# Patient Record
Sex: Male | Born: 1970 | Race: Black or African American | Hispanic: No | Marital: Married | Smoking: Current some day smoker
Health system: Southern US, Community
[De-identification: ages and names within clinical notes are randomized; demographics above are authoritative.]

## PROBLEM LIST (undated history)

## (undated) DIAGNOSIS — I639 Cerebral infarction, unspecified: Secondary | ICD-10-CM

## (undated) DIAGNOSIS — R51 Headache: Secondary | ICD-10-CM

## (undated) DIAGNOSIS — I429 Cardiomyopathy, unspecified: Secondary | ICD-10-CM

## (undated) DIAGNOSIS — Z720 Tobacco use: Secondary | ICD-10-CM

## (undated) DIAGNOSIS — I2510 Atherosclerotic heart disease of native coronary artery without angina pectoris: Secondary | ICD-10-CM

## (undated) DIAGNOSIS — J189 Pneumonia, unspecified organism: Secondary | ICD-10-CM

## (undated) DIAGNOSIS — I63412 Cerebral infarction due to embolism of left middle cerebral artery: Secondary | ICD-10-CM

## (undated) DIAGNOSIS — I38 Endocarditis, valve unspecified: Secondary | ICD-10-CM

## (undated) DIAGNOSIS — Z9114 Patient's other noncompliance with medication regimen: Secondary | ICD-10-CM

## (undated) DIAGNOSIS — I10 Essential (primary) hypertension: Secondary | ICD-10-CM

## (undated) DIAGNOSIS — D126 Benign neoplasm of colon, unspecified: Secondary | ICD-10-CM

## (undated) DIAGNOSIS — M5136 Other intervertebral disc degeneration, lumbar region: Secondary | ICD-10-CM

## (undated) DIAGNOSIS — F32A Depression, unspecified: Secondary | ICD-10-CM

## (undated) DIAGNOSIS — N289 Disorder of kidney and ureter, unspecified: Secondary | ICD-10-CM

## (undated) DIAGNOSIS — I779 Disorder of arteries and arterioles, unspecified: Secondary | ICD-10-CM

## (undated) DIAGNOSIS — R011 Cardiac murmur, unspecified: Secondary | ICD-10-CM

## (undated) DIAGNOSIS — K635 Polyp of colon: Secondary | ICD-10-CM

## (undated) DIAGNOSIS — F419 Anxiety disorder, unspecified: Secondary | ICD-10-CM

## (undated) DIAGNOSIS — E785 Hyperlipidemia, unspecified: Secondary | ICD-10-CM

## (undated) DIAGNOSIS — I712 Thoracic aortic aneurysm, without rupture, unspecified: Secondary | ICD-10-CM

## (undated) DIAGNOSIS — Z9289 Personal history of other medical treatment: Secondary | ICD-10-CM

## (undated) DIAGNOSIS — F1010 Alcohol abuse, uncomplicated: Secondary | ICD-10-CM

## (undated) DIAGNOSIS — F329 Major depressive disorder, single episode, unspecified: Secondary | ICD-10-CM

## (undated) DIAGNOSIS — Z91148 Patient's other noncompliance with medication regimen for other reason: Secondary | ICD-10-CM

## (undated) DIAGNOSIS — R519 Headache, unspecified: Secondary | ICD-10-CM

## (undated) DIAGNOSIS — N189 Chronic kidney disease, unspecified: Secondary | ICD-10-CM

## (undated) DIAGNOSIS — I7100 Dissection of unspecified site of aorta: Secondary | ICD-10-CM

## (undated) DIAGNOSIS — R109 Unspecified abdominal pain: Secondary | ICD-10-CM

## (undated) DIAGNOSIS — K219 Gastro-esophageal reflux disease without esophagitis: Secondary | ICD-10-CM

## (undated) DIAGNOSIS — I7101 Dissection of thoracic aorta: Secondary | ICD-10-CM

## (undated) DIAGNOSIS — G8929 Other chronic pain: Secondary | ICD-10-CM

## (undated) DIAGNOSIS — N186 End stage renal disease: Secondary | ICD-10-CM

## (undated) DIAGNOSIS — K4090 Unilateral inguinal hernia, without obstruction or gangrene, not specified as recurrent: Secondary | ICD-10-CM

## (undated) DIAGNOSIS — K645 Perianal venous thrombosis: Secondary | ICD-10-CM

## (undated) DIAGNOSIS — J329 Chronic sinusitis, unspecified: Secondary | ICD-10-CM

## (undated) DIAGNOSIS — I5042 Chronic combined systolic (congestive) and diastolic (congestive) heart failure: Secondary | ICD-10-CM

## (undated) DIAGNOSIS — M51369 Other intervertebral disc degeneration, lumbar region without mention of lumbar back pain or lower extremity pain: Secondary | ICD-10-CM

## (undated) DIAGNOSIS — I7123 Aneurysm of the descending thoracic aorta, without rupture: Secondary | ICD-10-CM

## (undated) DIAGNOSIS — I71019 Dissection of thoracic aorta, unspecified: Secondary | ICD-10-CM

## (undated) HISTORY — DX: Hyperlipidemia, unspecified: E78.5

## (undated) HISTORY — PX: INGUINAL HERNIA REPAIR: SUR1180

## (undated) HISTORY — DX: Major depressive disorder, single episode, unspecified: F32.9

## (undated) HISTORY — DX: Polyp of colon: K63.5

## (undated) HISTORY — DX: Headache: R51

## (undated) HISTORY — DX: Headache, unspecified: R51.9

## (undated) HISTORY — DX: Personal history of other medical treatment: Z92.89

## (undated) HISTORY — PX: FOOT FRACTURE SURGERY: SHX645

## (undated) HISTORY — DX: Dissection of unspecified site of aorta: I71.00

## (undated) HISTORY — DX: Cerebral infarction, unspecified: I63.9

## (undated) HISTORY — DX: Depression, unspecified: F32.A

## (undated) HISTORY — DX: Chronic combined systolic (congestive) and diastolic (congestive) heart failure: I50.42

## (undated) HISTORY — DX: Atherosclerotic heart disease of native coronary artery without angina pectoris: I25.10

## (undated) HISTORY — DX: Disorder of arteries and arterioles, unspecified: I77.9

## (undated) HISTORY — DX: Benign neoplasm of colon, unspecified: D12.6

## (undated) HISTORY — PX: ANKLE SURGERY: SHX546

## (undated) HISTORY — DX: Unilateral inguinal hernia, without obstruction or gangrene, not specified as recurrent: K40.90

## (undated) NOTE — *Deleted (*Deleted)
*In-Progress* HPI:   PCP:  Kerin Perna, NP          Cardiologist:  Dr. Aundra Dubin   History of Present Illness: [name] is a 17 y.o. male who has a history of CAD, NSTEMI 11/2017, smoking, CKD Stage III, HTN, AAA repair 2016at [facility] , open repair of TAAA secondary to chronic dissection with multibranch dacrongraft 12/2016, CVA 2017, loop recorder, bioprothetic AVR 2016, HLD.   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 The descending thoracic aorta has dilated to 5.1 cm. Cardiac catheterization in April 2016 demonstrated 60% first diagonal lesion. He underwent aortic valve replacement with bovine pericardial tissue valve and reconstruction along with aortic root replacement and endovascular repair of the descending thoracic aortic aneurysm at [facility]. He has had intermittent chest discomfort since then.  Admitted in 12/02/2017 chest pain. Had NSTEMI. He had LHC with stent placed proximal ramus.  Repeat cath in 7/20 with nonobstructive disease.   He was admitted in 11/24/19 with atypical chest pain.  Echo showed EF 60-65% with severe LVH, normal RV, bioprosthetic aortic valve with mean gradient 18 mmHg.  Cardiolite showed no ischemia.   He presented to clinic with Dr. Aundra Dubin 11/28/19 for followup of CAD, HTN.  He was down to about 2 cigarettes/day at time of the visit.  Main complaint was chest pain. This has been present now for years.  It usually presents at rest, often when he is lying in bed. Tums sometimes helps, sometimes related to meals but not always.   He also reported getting chest pain with exertion/arm movement. He fatigues easily with heavier activity like push-mowing his grass. No significant exertional dyspnea. BP high at time of appointment.   Today he returns to HF clinic for HF/HTN medication titration. At last visit  with Dr. Aundra Dubin 11/28/19, hydralazine was initiated.    Overall feeling ***. Dizziness, lightheadedness, fatigue:  Chest pain or palpitations:  How is your breathing?: *** SOB: Able to complete all ADLs. Activity level ***  Weight at home pounds. Takes furosemide/torsemide/bumex *** mg *** daily.  LEE PND/Orthopnea  Appetite *** Low-salt diet:   Physical Exam Cost/affordability of meds    . Shortness of breath/dyspnea on exertion? {YES [id number]  . Orthopnea/PND? {YES [id number] . Edema? {YES [id number] . Lightheadedness/dizziness? {YES [id number] . Daily weights at home? {YES [id number] . Blood pressure/heart rate monitoring at home? {YES [id number] . Following low-sodium/fluid-restricted diet? {YES [id number]  HF/HTN Medications: Carvedilol 25 mg BID  Hydralazine 25 mg TID  Amlodipine 10 mg daily  Doxazosin 16 mg QHS  Clonidine 1 patch weekly   Has the patient been experiencing any side effects to the medications prescribed?  {YES NO:22349}  Does the patient have any problems obtaining medications due to transportation or finances?   {YES NO:22349}  Understanding of regimen: {excellent/good/fair/poor:19665} Understanding of indications: {excellent/good/fair/poor:19665} Potential of compliance: {excellent/good/fair/poor:19665} Patient understands to avoid NSAIDs. Patient understands to avoid decongestants.    Pertinent Lab Values 11/26/19: . Serum creatinine 3.31, BUN 24, Potassium 4.1, Sodium 139   Vital Signs: . Weight: *** (last clinic weight: 160 lb) . Blood pressure: ***  . Heart rate: ***   Assessment/Plan: 1. CAD: 9/19 NSTEMI with PCI to proximal ramus.  LHC in 7/20 with no obstructive disease.  Recent admission in 9/21 with Cardiolite showing no ischemia.  He has long-standing exertional and  non-exertional chest pain, suspectedI to be musculoskeletal and related to his prior surgeries.  Cannot rule out GERD as a contributor (occurs when lying in bed, some  relief with TUMS).  - Continue Protonix 40 mg daily.  - Continue ASA 81.  - Continue Repatha via the lipid clinic.  2. 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 He is not volume overloaded on exam and is not using Lasix. Cannot get cardiac MRI with elevated creatinine.  Most recent echo in 9/21 showed EF up to 60-65%, severe LVH, normal RV.  - Continue carvedilol 25 mg bid.  - He is off spironolactone and Entresto with CKD stage IV.  3. HTN: Still needs better control.   - Continue amlodipine 10 mg daily, minoxidil, and clonidine.  - Continue hydralazine to 50 mg tid.  - Suspect OSA, will order sleep study.  4. S/P Bioprothetic AVR: Echo in 9/21 with mildly elevated mean gradient (18 mmHg).  5. 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 [name] at [facility].  6. CKD Stage IIIb-IV: Creatinine most recently up to 3.31.   7.Active smoker: He is cutting back   Audry Riles, PharmD, BCPS HF Clinic Pharmacist Phone: [phone] 12/24/2019 3:03 PM

---

## 1997-08-10 ENCOUNTER — Emergency Department (HOSPITAL_COMMUNITY): Admission: EM | Admit: 1997-08-10 | Discharge: 1997-08-10 | Payer: Self-pay | Admitting: Emergency Medicine

## 1999-03-03 ENCOUNTER — Emergency Department (HOSPITAL_COMMUNITY): Admission: EM | Admit: 1999-03-03 | Discharge: 1999-03-03 | Payer: Self-pay | Admitting: Emergency Medicine

## 1999-03-03 ENCOUNTER — Encounter: Payer: Self-pay | Admitting: Emergency Medicine

## 1999-03-26 ENCOUNTER — Observation Stay (HOSPITAL_COMMUNITY): Admission: RE | Admit: 1999-03-26 | Discharge: 1999-03-27 | Payer: Self-pay | Admitting: Specialist

## 1999-03-26 ENCOUNTER — Encounter: Payer: Self-pay | Admitting: Specialist

## 2000-01-07 ENCOUNTER — Encounter: Payer: Self-pay | Admitting: Emergency Medicine

## 2000-01-07 ENCOUNTER — Emergency Department (HOSPITAL_COMMUNITY): Admission: EM | Admit: 2000-01-07 | Discharge: 2000-01-07 | Payer: Self-pay | Admitting: Emergency Medicine

## 2001-11-21 ENCOUNTER — Emergency Department (HOSPITAL_COMMUNITY): Admission: EM | Admit: 2001-11-21 | Discharge: 2001-11-22 | Payer: Self-pay | Admitting: Emergency Medicine

## 2001-11-22 ENCOUNTER — Encounter: Payer: Self-pay | Admitting: Emergency Medicine

## 2002-07-20 ENCOUNTER — Encounter: Payer: Self-pay | Admitting: Emergency Medicine

## 2002-07-20 ENCOUNTER — Emergency Department (HOSPITAL_COMMUNITY): Admission: EM | Admit: 2002-07-20 | Discharge: 2002-07-20 | Payer: Self-pay | Admitting: Emergency Medicine

## 2002-09-05 ENCOUNTER — Encounter: Payer: Self-pay | Admitting: Emergency Medicine

## 2002-09-05 ENCOUNTER — Emergency Department (HOSPITAL_COMMUNITY): Admission: EM | Admit: 2002-09-05 | Discharge: 2002-09-05 | Payer: Self-pay | Admitting: Emergency Medicine

## 2002-10-11 ENCOUNTER — Emergency Department (HOSPITAL_COMMUNITY): Admission: EM | Admit: 2002-10-11 | Discharge: 2002-10-12 | Payer: Self-pay | Admitting: Emergency Medicine

## 2003-05-15 ENCOUNTER — Emergency Department (HOSPITAL_COMMUNITY): Admission: EM | Admit: 2003-05-15 | Discharge: 2003-05-15 | Payer: Self-pay | Admitting: *Deleted

## 2003-05-31 ENCOUNTER — Emergency Department (HOSPITAL_COMMUNITY): Admission: EM | Admit: 2003-05-31 | Discharge: 2003-05-31 | Payer: Self-pay | Admitting: Emergency Medicine

## 2005-04-06 ENCOUNTER — Emergency Department (HOSPITAL_COMMUNITY): Admission: EM | Admit: 2005-04-06 | Discharge: 2005-04-06 | Payer: Self-pay | Admitting: Emergency Medicine

## 2006-07-30 ENCOUNTER — Emergency Department (HOSPITAL_COMMUNITY): Admission: EM | Admit: 2006-07-30 | Discharge: 2006-07-30 | Payer: Self-pay | Admitting: *Deleted

## 2006-10-11 ENCOUNTER — Emergency Department (HOSPITAL_COMMUNITY): Admission: EM | Admit: 2006-10-11 | Discharge: 2006-10-11 | Payer: Self-pay | Admitting: Emergency Medicine

## 2006-10-22 ENCOUNTER — Emergency Department (HOSPITAL_COMMUNITY): Admission: EM | Admit: 2006-10-22 | Discharge: 2006-10-22 | Payer: Self-pay | Admitting: Emergency Medicine

## 2006-10-28 ENCOUNTER — Ambulatory Visit: Admission: RE | Admit: 2006-10-28 | Discharge: 2006-10-28 | Payer: Self-pay | Admitting: Orthopedic Surgery

## 2006-11-08 ENCOUNTER — Emergency Department (HOSPITAL_COMMUNITY): Admission: EM | Admit: 2006-11-08 | Discharge: 2006-11-08 | Payer: Self-pay | Admitting: Emergency Medicine

## 2006-11-25 ENCOUNTER — Ambulatory Visit: Payer: Self-pay | Admitting: *Deleted

## 2006-11-25 ENCOUNTER — Ambulatory Visit: Payer: Self-pay | Admitting: Internal Medicine

## 2006-11-25 ENCOUNTER — Encounter (INDEPENDENT_AMBULATORY_CARE_PROVIDER_SITE_OTHER): Payer: Self-pay | Admitting: Nurse Practitioner

## 2006-11-25 LAB — CONVERTED CEMR LAB
AST: 17 units/L (ref 0–37)
Albumin: 4.3 g/dL (ref 3.5–5.2)
BUN: 9 mg/dL (ref 6–23)
Calcium: 9.6 mg/dL (ref 8.4–10.5)
Chloride: 106 meq/L (ref 96–112)
Glucose, Bld: 99 mg/dL (ref 70–99)
Lymphocytes Relative: 37 % (ref 12–46)
Lymphs Abs: 2.1 10*3/uL (ref 0.7–3.3)
Monocytes Relative: 7 % (ref 3–11)
Neutro Abs: 2.9 10*3/uL (ref 1.7–7.7)
Neutrophils Relative %: 51 % (ref 43–77)
Potassium: 4.6 meq/L (ref 3.5–5.3)
RBC: 4.61 M/uL (ref 4.22–5.81)
Sodium: 140 meq/L (ref 135–145)
Total Protein: 7.4 g/dL (ref 6.0–8.3)
WBC: 5.6 10*3/uL (ref 4.0–10.5)

## 2006-12-13 ENCOUNTER — Ambulatory Visit: Payer: Self-pay | Admitting: Internal Medicine

## 2007-01-11 ENCOUNTER — Emergency Department (HOSPITAL_COMMUNITY): Admission: EM | Admit: 2007-01-11 | Discharge: 2007-01-11 | Payer: Self-pay | Admitting: Emergency Medicine

## 2007-05-19 ENCOUNTER — Encounter (INDEPENDENT_AMBULATORY_CARE_PROVIDER_SITE_OTHER): Payer: Self-pay | Admitting: Nurse Practitioner

## 2007-05-19 ENCOUNTER — Ambulatory Visit: Payer: Self-pay | Admitting: Family Medicine

## 2007-05-19 LAB — CONVERTED CEMR LAB
ALT: 26 units/L (ref 0–53)
AST: 19 units/L (ref 0–37)
Albumin: 4.4 g/dL (ref 3.5–5.2)
Alkaline Phosphatase: 76 units/L (ref 39–117)
BUN: 12 mg/dL (ref 6–23)
Basophils Relative: 1 % (ref 0–1)
Calcium: 9.5 mg/dL (ref 8.4–10.5)
Chloride: 108 meq/L (ref 96–112)
Eosinophils Absolute: 0.2 10*3/uL (ref 0.0–0.7)
MCHC: 33.6 g/dL (ref 30.0–36.0)
Monocytes Relative: 12 % (ref 3–12)
Neutro Abs: 2.2 10*3/uL (ref 1.7–7.7)
Neutrophils Relative %: 46 % (ref 43–77)
Platelets: 178 10*3/uL (ref 150–400)
Potassium: 4.3 meq/L (ref 3.5–5.3)
RBC: 4.68 M/uL (ref 4.22–5.81)
Sodium: 142 meq/L (ref 135–145)
Total Protein: 7.4 g/dL (ref 6.0–8.3)
WBC: 4.9 10*3/uL (ref 4.0–10.5)

## 2007-05-23 ENCOUNTER — Ambulatory Visit: Payer: Self-pay | Admitting: Gastroenterology

## 2007-06-13 ENCOUNTER — Ambulatory Visit: Payer: Self-pay | Admitting: Gastroenterology

## 2007-06-28 ENCOUNTER — Emergency Department (HOSPITAL_COMMUNITY): Admission: EM | Admit: 2007-06-28 | Discharge: 2007-06-28 | Payer: Self-pay | Admitting: Emergency Medicine

## 2007-09-07 ENCOUNTER — Emergency Department (HOSPITAL_COMMUNITY): Admission: EM | Admit: 2007-09-07 | Discharge: 2007-09-07 | Payer: Self-pay | Admitting: Emergency Medicine

## 2007-09-26 ENCOUNTER — Emergency Department (HOSPITAL_COMMUNITY): Admission: EM | Admit: 2007-09-26 | Discharge: 2007-09-26 | Payer: Self-pay | Admitting: Emergency Medicine
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
## 2008-01-18 ENCOUNTER — Ambulatory Visit (HOSPITAL_COMMUNITY): Admission: RE | Admit: 2008-01-18 | Discharge: 2008-01-18 | Payer: Self-pay | Admitting: Orthopedic Surgery

## 2008-01-27 ENCOUNTER — Ambulatory Visit (HOSPITAL_COMMUNITY): Admission: RE | Admit: 2008-01-27 | Discharge: 2008-01-28 | Payer: Self-pay | Admitting: Orthopedic Surgery

## 2008-06-28 ENCOUNTER — Ambulatory Visit (HOSPITAL_BASED_OUTPATIENT_CLINIC_OR_DEPARTMENT_OTHER): Admission: RE | Admit: 2008-06-28 | Discharge: 2008-06-28 | Payer: Self-pay | Admitting: Orthopedic Surgery

## 2008-11-01 ENCOUNTER — Emergency Department (HOSPITAL_COMMUNITY): Admission: EM | Admit: 2008-11-01 | Discharge: 2008-11-01 | Payer: Self-pay | Admitting: Emergency Medicine

## 2008-11-20 ENCOUNTER — Encounter: Admission: RE | Admit: 2008-11-20 | Discharge: 2008-11-20 | Payer: Self-pay | Admitting: Orthopedic Surgery

## 2008-12-03 ENCOUNTER — Encounter: Admission: RE | Admit: 2008-12-03 | Discharge: 2008-12-03 | Payer: Self-pay | Admitting: Orthopedic Surgery

## 2009-01-07 ENCOUNTER — Ambulatory Visit (HOSPITAL_COMMUNITY): Admission: RE | Admit: 2009-01-07 | Discharge: 2009-01-07 | Payer: Self-pay | Admitting: Orthopedic Surgery

## 2009-01-16 ENCOUNTER — Ambulatory Visit: Payer: Self-pay | Admitting: Physician Assistant

## 2009-01-16 DIAGNOSIS — R011 Cardiac murmur, unspecified: Secondary | ICD-10-CM | POA: Insufficient documentation

## 2009-01-16 DIAGNOSIS — I10 Essential (primary) hypertension: Secondary | ICD-10-CM | POA: Insufficient documentation

## 2009-01-16 LAB — CONVERTED CEMR LAB
Blood in Urine, dipstick: NEGATIVE
Glucose, Urine, Semiquant: NEGATIVE
Nitrite: NEGATIVE
Protein, U semiquant: 30
Specific Gravity, Urine: >=1.03
Urobilinogen, UA: 0.2
WBC Urine, dipstick: NEGATIVE

## 2009-01-17 ENCOUNTER — Encounter: Payer: Self-pay | Admitting: Physician Assistant

## 2009-01-17 LAB — CONVERTED CEMR LAB
ALT: 21 units/L (ref 0–53)
Alkaline Phosphatase: 69 units/L (ref 39–117)
Amphetamine Screen, Ur: NEGATIVE
Basophils Absolute: 0 10*3/uL (ref 0.0–0.1)
Basophils Relative: 1 % (ref 0–1)
Benzodiazepines.: NEGATIVE
CO2: 22 meq/L (ref 19–32)
Cocaine Metabolites: NEGATIVE
Creatinine, Ser: 1.2 mg/dL (ref 0.40–1.50)
Eosinophils Absolute: 0.1 10*3/uL (ref 0.0–0.7)
Glucose, Bld: 79 mg/dL (ref 70–99)
MCHC: 33.5 g/dL (ref 30.0–36.0)
MCV: 97.8 fL (ref 78.0–100.0)
Marijuana Metabolite: POSITIVE — AB
Monocytes Relative: 12 % (ref 3–12)
Neutro Abs: 2.7 10*3/uL (ref 1.7–7.7)
Neutrophils Relative %: 51 % (ref 43–77)
Platelets: 180 10*3/uL (ref 150–400)
RBC: 4.64 M/uL (ref 4.22–5.81)
RDW: 13.8 % (ref 11.5–15.5)
Sodium: 140 meq/L (ref 135–145)
TSH: 1.051 microintl units/mL (ref 0.350–4.500)
Total Bilirubin: 0.5 mg/dL (ref 0.3–1.2)

## 2009-01-21 ENCOUNTER — Ambulatory Visit: Payer: Self-pay | Admitting: Physician Assistant

## 2009-01-21 LAB — CONVERTED CEMR LAB
BUN: 9 mg/dL (ref 6–23)
CO2: 25 meq/L (ref 19–32)
Glucose, Bld: 80 mg/dL (ref 70–99)
Potassium: 4.2 meq/L (ref 3.5–5.3)
Sodium: 140 meq/L (ref 135–145)

## 2009-01-25 ENCOUNTER — Encounter: Payer: Self-pay | Admitting: Physician Assistant

## 2009-01-28 ENCOUNTER — Emergency Department (HOSPITAL_COMMUNITY): Admission: EM | Admit: 2009-01-28 | Discharge: 2009-01-28 | Payer: Self-pay | Admitting: Emergency Medicine

## 2009-02-01 ENCOUNTER — Ambulatory Visit: Payer: Self-pay | Admitting: Physician Assistant

## 2009-02-11 ENCOUNTER — Encounter: Payer: Self-pay | Admitting: Physician Assistant

## 2009-02-14 ENCOUNTER — Emergency Department (HOSPITAL_COMMUNITY): Admission: EM | Admit: 2009-02-14 | Discharge: 2009-02-14 | Payer: Self-pay | Admitting: Emergency Medicine

## 2009-02-18 ENCOUNTER — Ambulatory Visit: Payer: Self-pay | Admitting: Physician Assistant

## 2009-02-18 DIAGNOSIS — K4090 Unilateral inguinal hernia, without obstruction or gangrene, not specified as recurrent: Secondary | ICD-10-CM | POA: Insufficient documentation

## 2009-02-18 LAB — CONVERTED CEMR LAB
Blood in Urine, dipstick: NEGATIVE
Nitrite: NEGATIVE
Protein, U semiquant: NEGATIVE
Urobilinogen, UA: 0.2
WBC Urine, dipstick: NEGATIVE

## 2009-02-21 ENCOUNTER — Encounter (INDEPENDENT_AMBULATORY_CARE_PROVIDER_SITE_OTHER): Payer: Self-pay | Admitting: Internal Medicine

## 2009-02-21 ENCOUNTER — Ambulatory Visit (HOSPITAL_COMMUNITY): Admission: RE | Admit: 2009-02-21 | Discharge: 2009-02-21 | Payer: Self-pay | Admitting: Internal Medicine

## 2009-02-26 ENCOUNTER — Encounter: Payer: Self-pay | Admitting: Internal Medicine

## 2009-02-26 ENCOUNTER — Ambulatory Visit: Payer: Self-pay | Admitting: Internal Medicine

## 2009-02-26 ENCOUNTER — Inpatient Hospital Stay (HOSPITAL_COMMUNITY): Admission: RE | Admit: 2009-02-26 | Discharge: 2009-03-01 | Payer: Self-pay | Admitting: Orthopedic Surgery

## 2009-03-11 ENCOUNTER — Encounter: Payer: Self-pay | Admitting: Physician Assistant

## 2009-03-11 DIAGNOSIS — I359 Nonrheumatic aortic valve disorder, unspecified: Secondary | ICD-10-CM | POA: Insufficient documentation

## 2009-03-27 IMAGING — CR DG CERVICAL SPINE COMPLETE 4+V
5 series · 5 of 5 positions shown · non-contrast
Comparison: none

CLINICAL DATA: Neck pain.  Numbness in hand and fingers. 
 CERVICAL SPINE ? 5 VIEW:

[w c-spine lat]
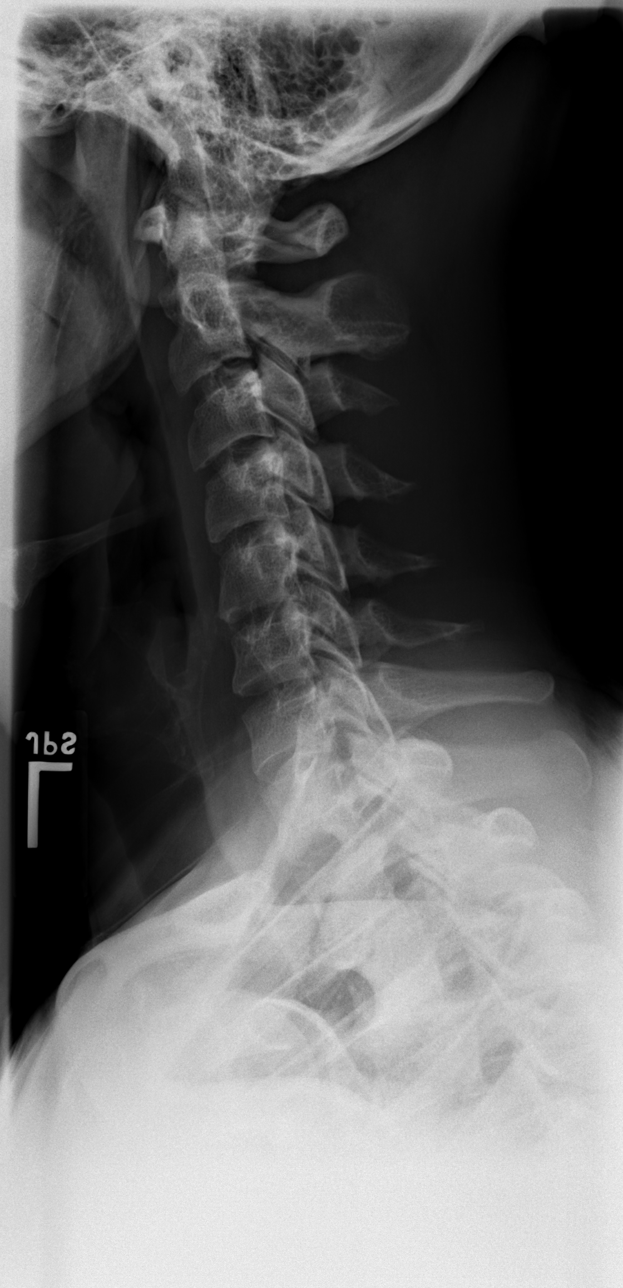

[w c-spine oblique (1 of 2)]
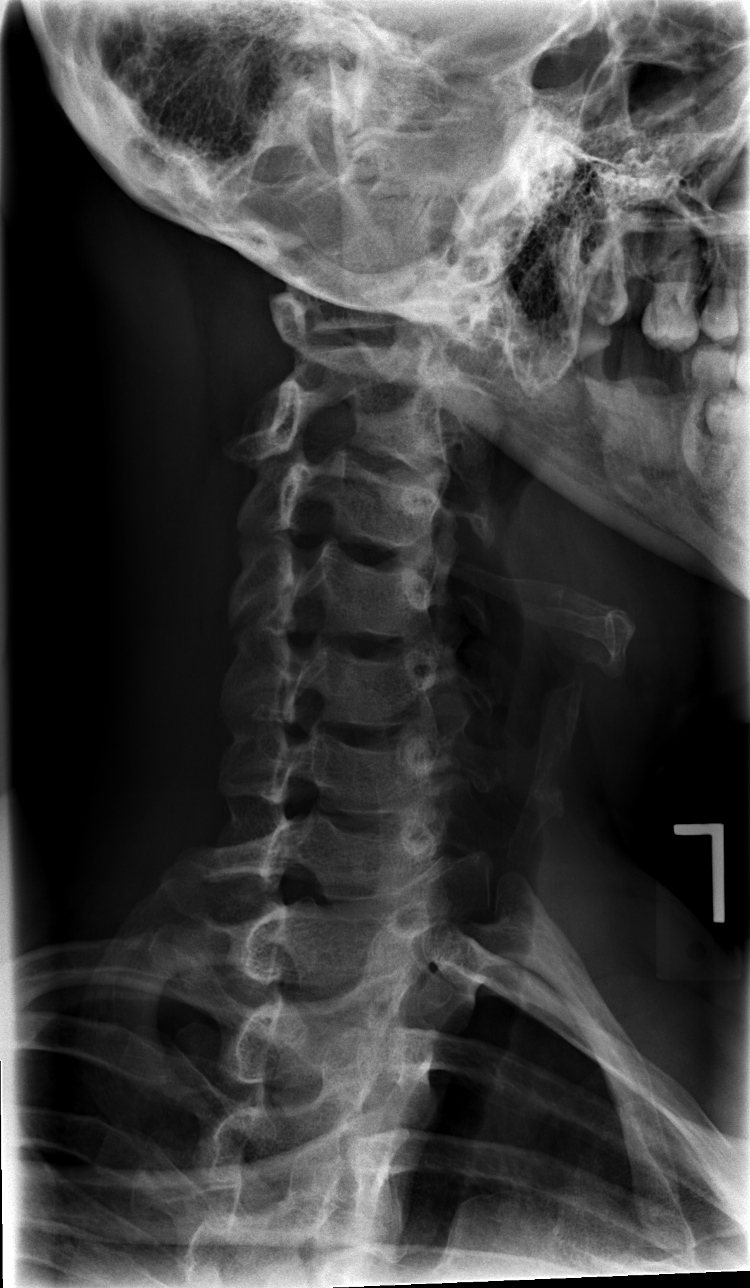

[w c-spine oblique (2 of 2)]
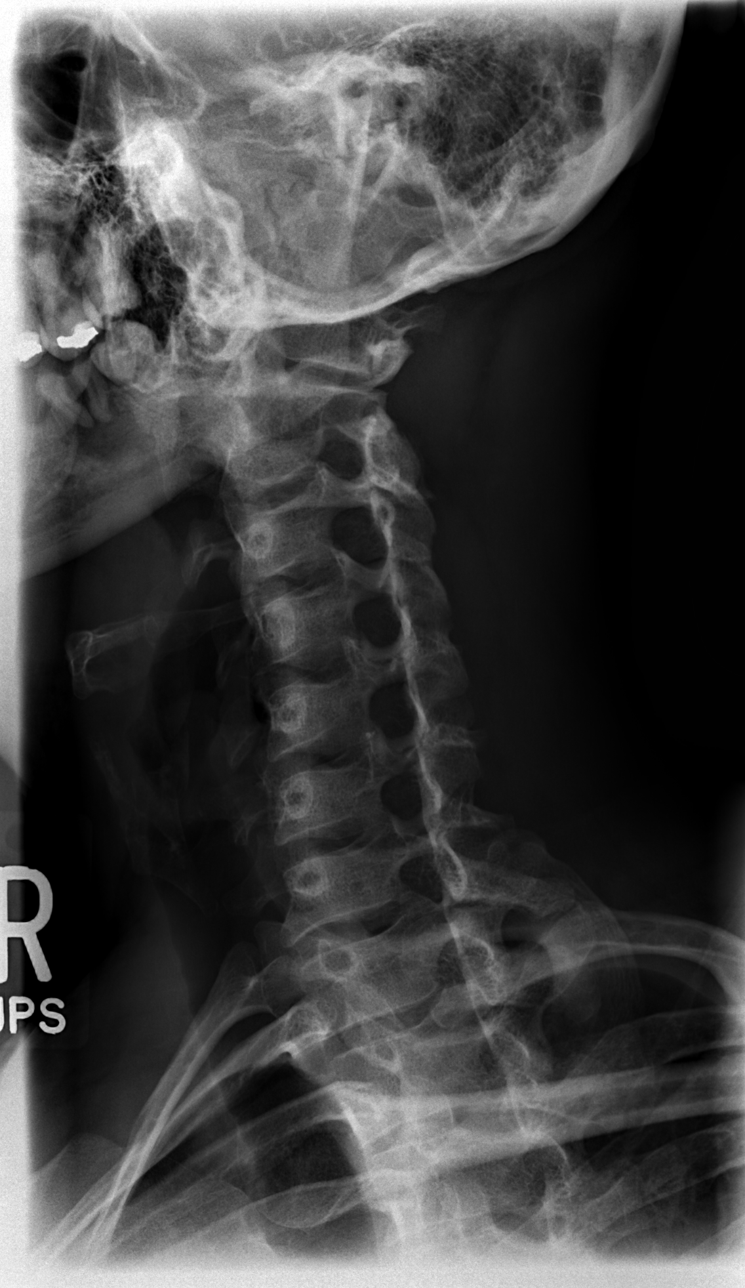

[w c-spine a.p.]
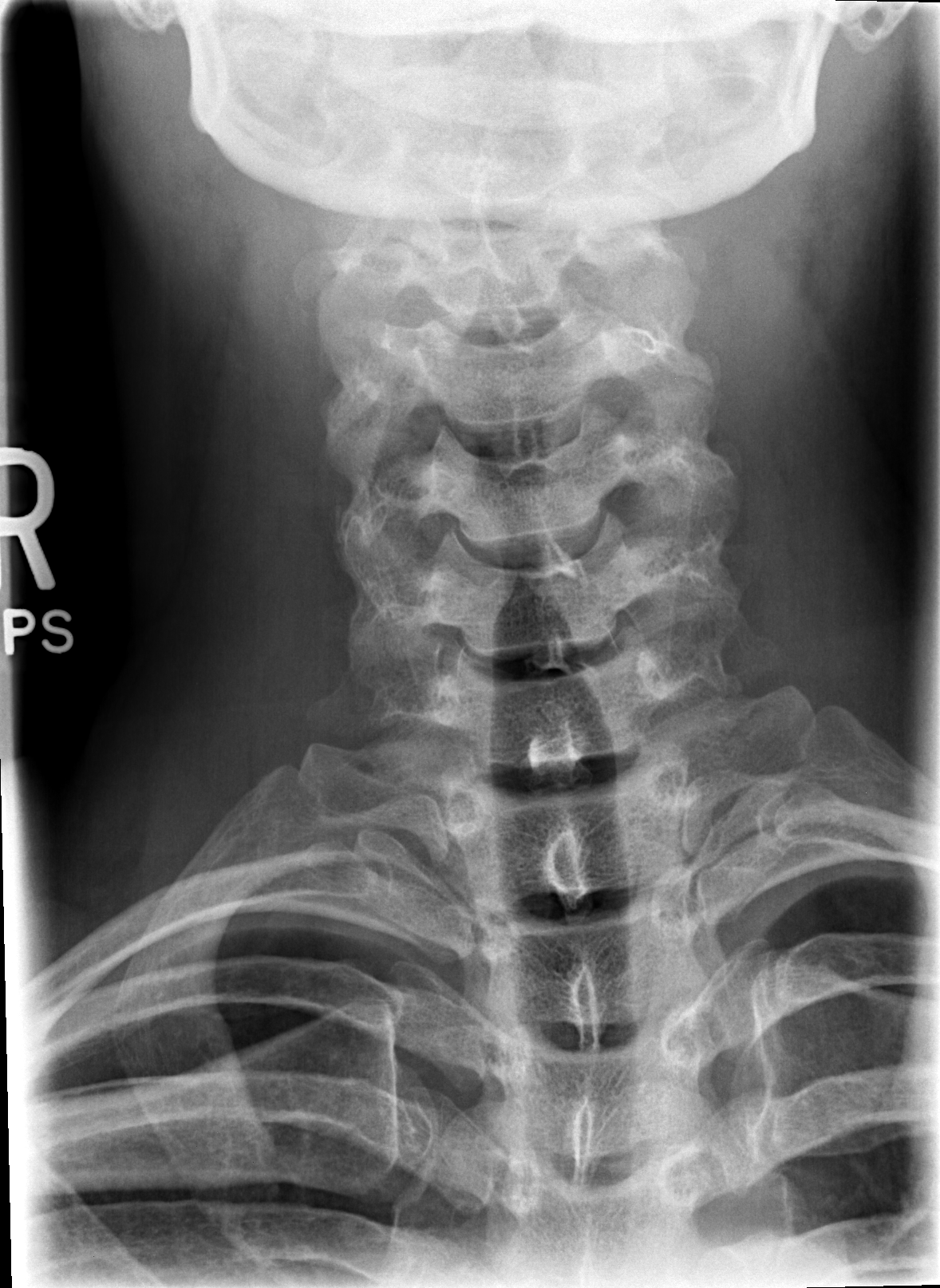

[w c-spine odontoid]
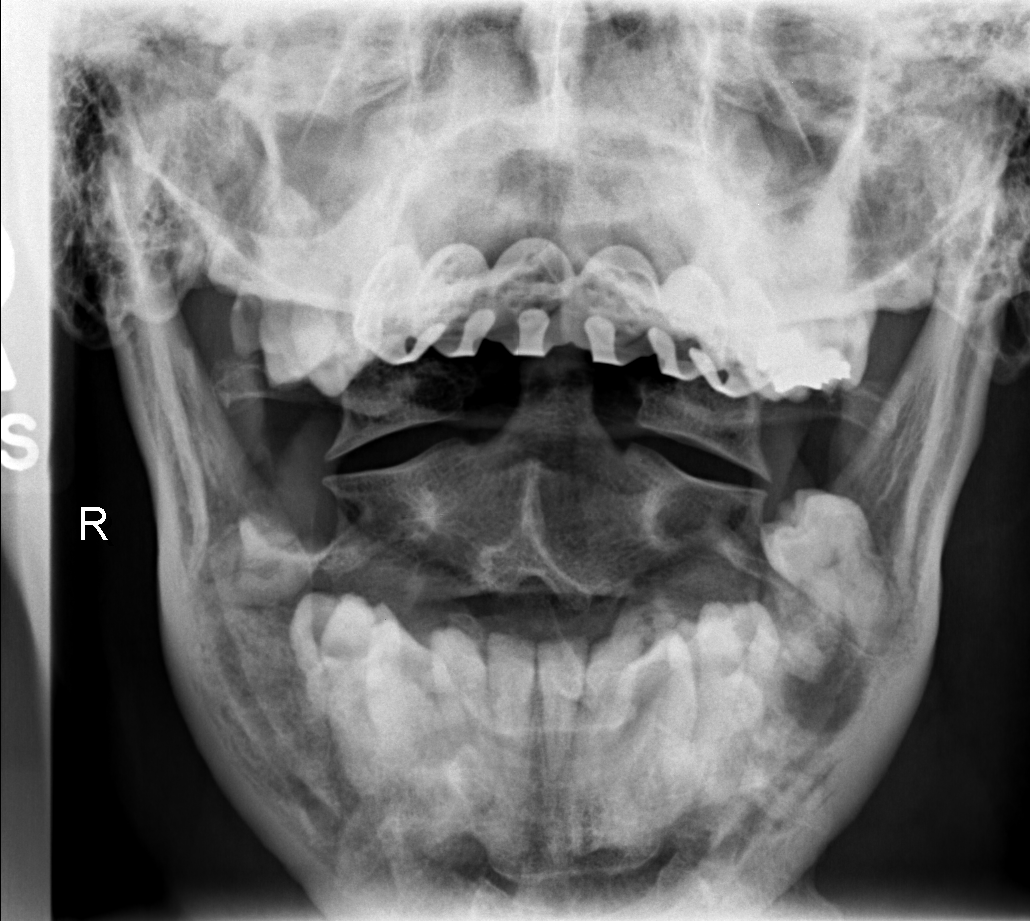

[5 of 5 positions shown; findings below may reference images not displayed]

FINDINGS: The cervical vertebrae are straightened in alignment.  Intervertebral disc spaces are normal.  No prevertebral soft tissue swelling is seen.  On oblique view, the foramina are patent.  The odontoid process is intact.
IMPRESSION: Straightened alignment.  No acute abnormality.
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
## 2009-06-19 IMAGING — CT CT EXTREM LOW W/O CM*L*
1 of 2 series · 1 of 14 positions shown, 2 images · IV contrast (agent unspecified)
Comparison: none

CLINICAL DATA: Injury, question calcaneal fracture.
 CT OF THE LEFT FOOT WITHOUT CONTRAST:
TECHNIQUE: Multidetector CT imaging was performed according to the standard protocol.  No intravenous contrast was administered.  Multiplanar CT image reconstructions were also generated.

[Series 4: control scan 2.0 b60s · axial · 0.49mm/px · z∈[-196,-196]mm · 1 of 1 slices shown, 2 images]
[im 1/1  soft-tissue]
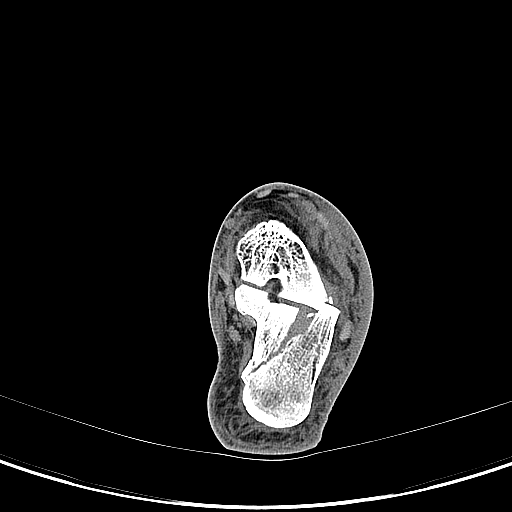
[im 1/1  bone]
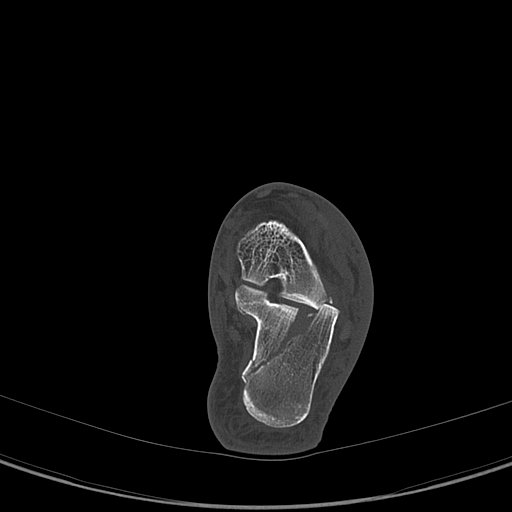

[1 of 14 positions shown; findings below may reference images not displayed]

FINDINGS: There is a comminuted impaction fracture of the calcaneus with incongruity of major fracture fragments by up to 1 cm with the sustentacula tali a separate fracture fragment.  There is a fracture of the anterior process.  The talus, which is not fractured, is impacted upon the calcaneus.  Slight decrease in Bohler?s angle.  Widening of the lateral aspect of the ankle mortis.
IMPRESSION: Comminuted impaction fracture of the calcaneus as described above.
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
## 2009-06-25 IMAGING — CR DG CHEST 2V
2 series · 2 of 2 positions shown · non-contrast
Comparison: None

CLINICAL DATA: Fractured calcaneus, preop. Smoker, hypertension

CHEST - 2 VIEW:

[view not recorded (1 of 2)]
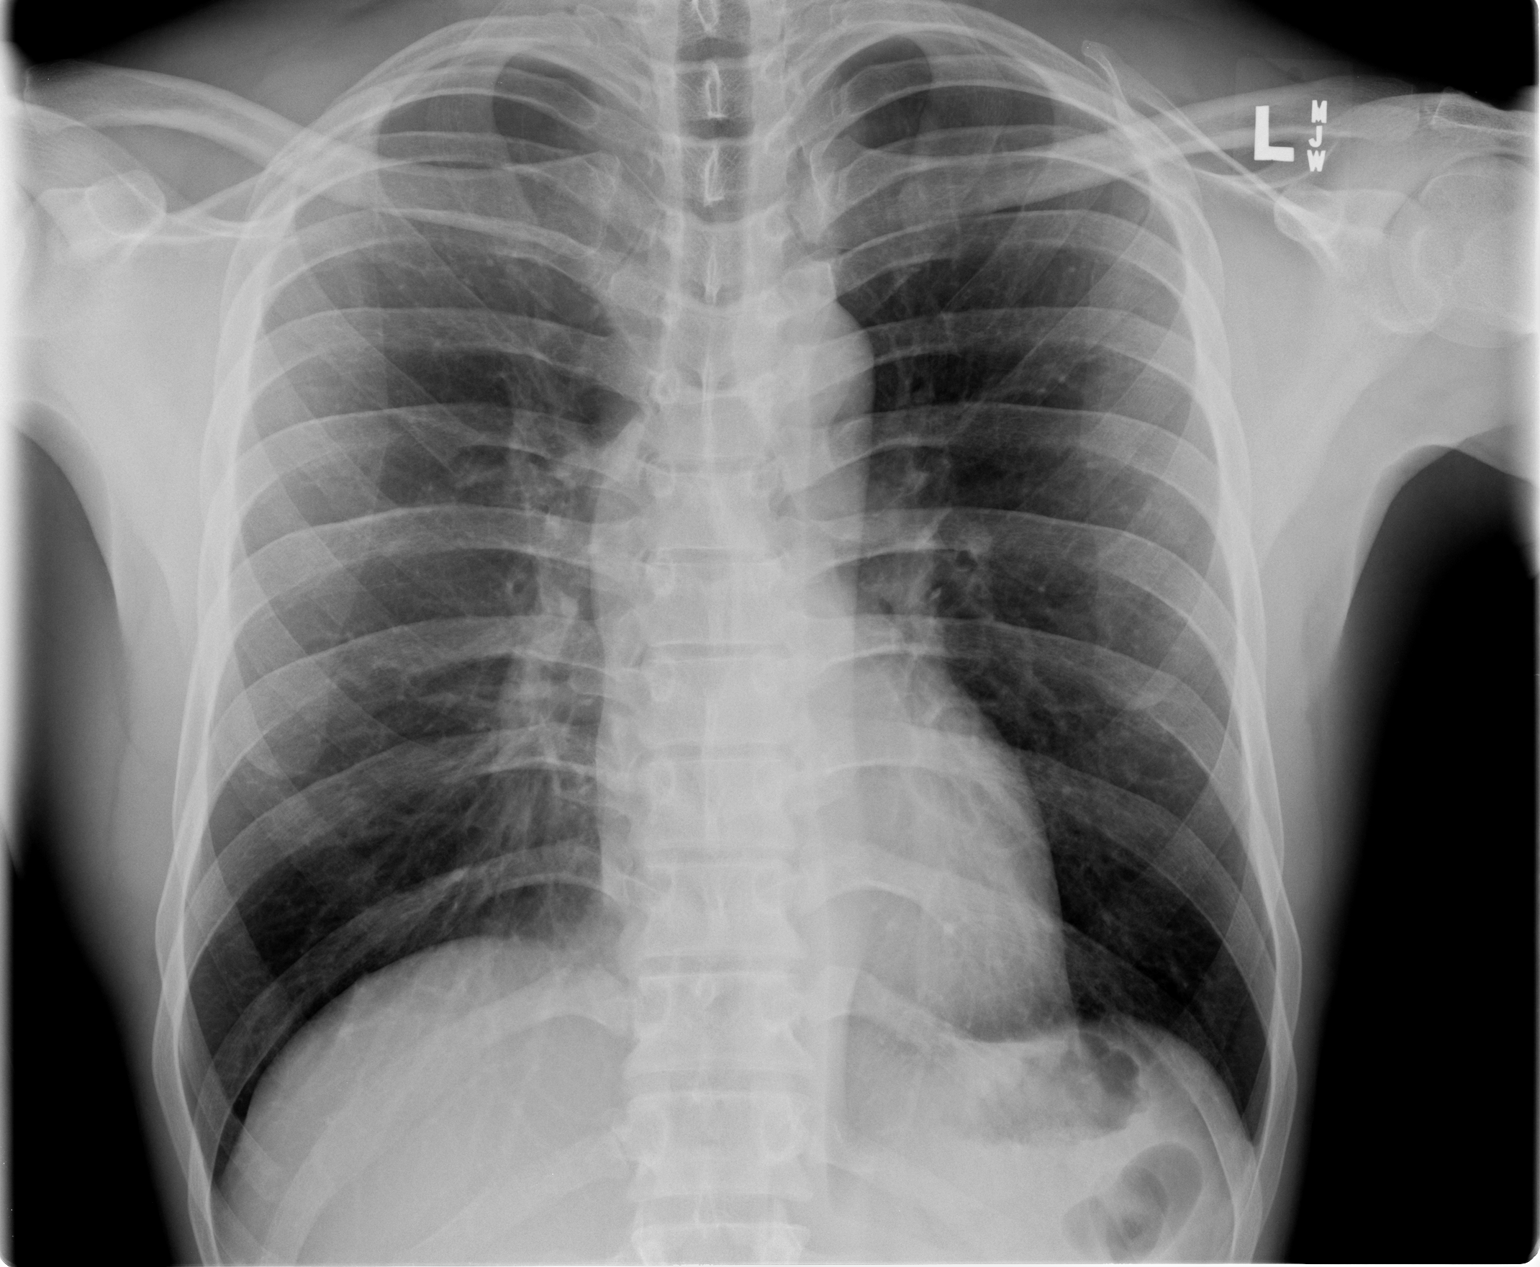

[view not recorded (2 of 2)]
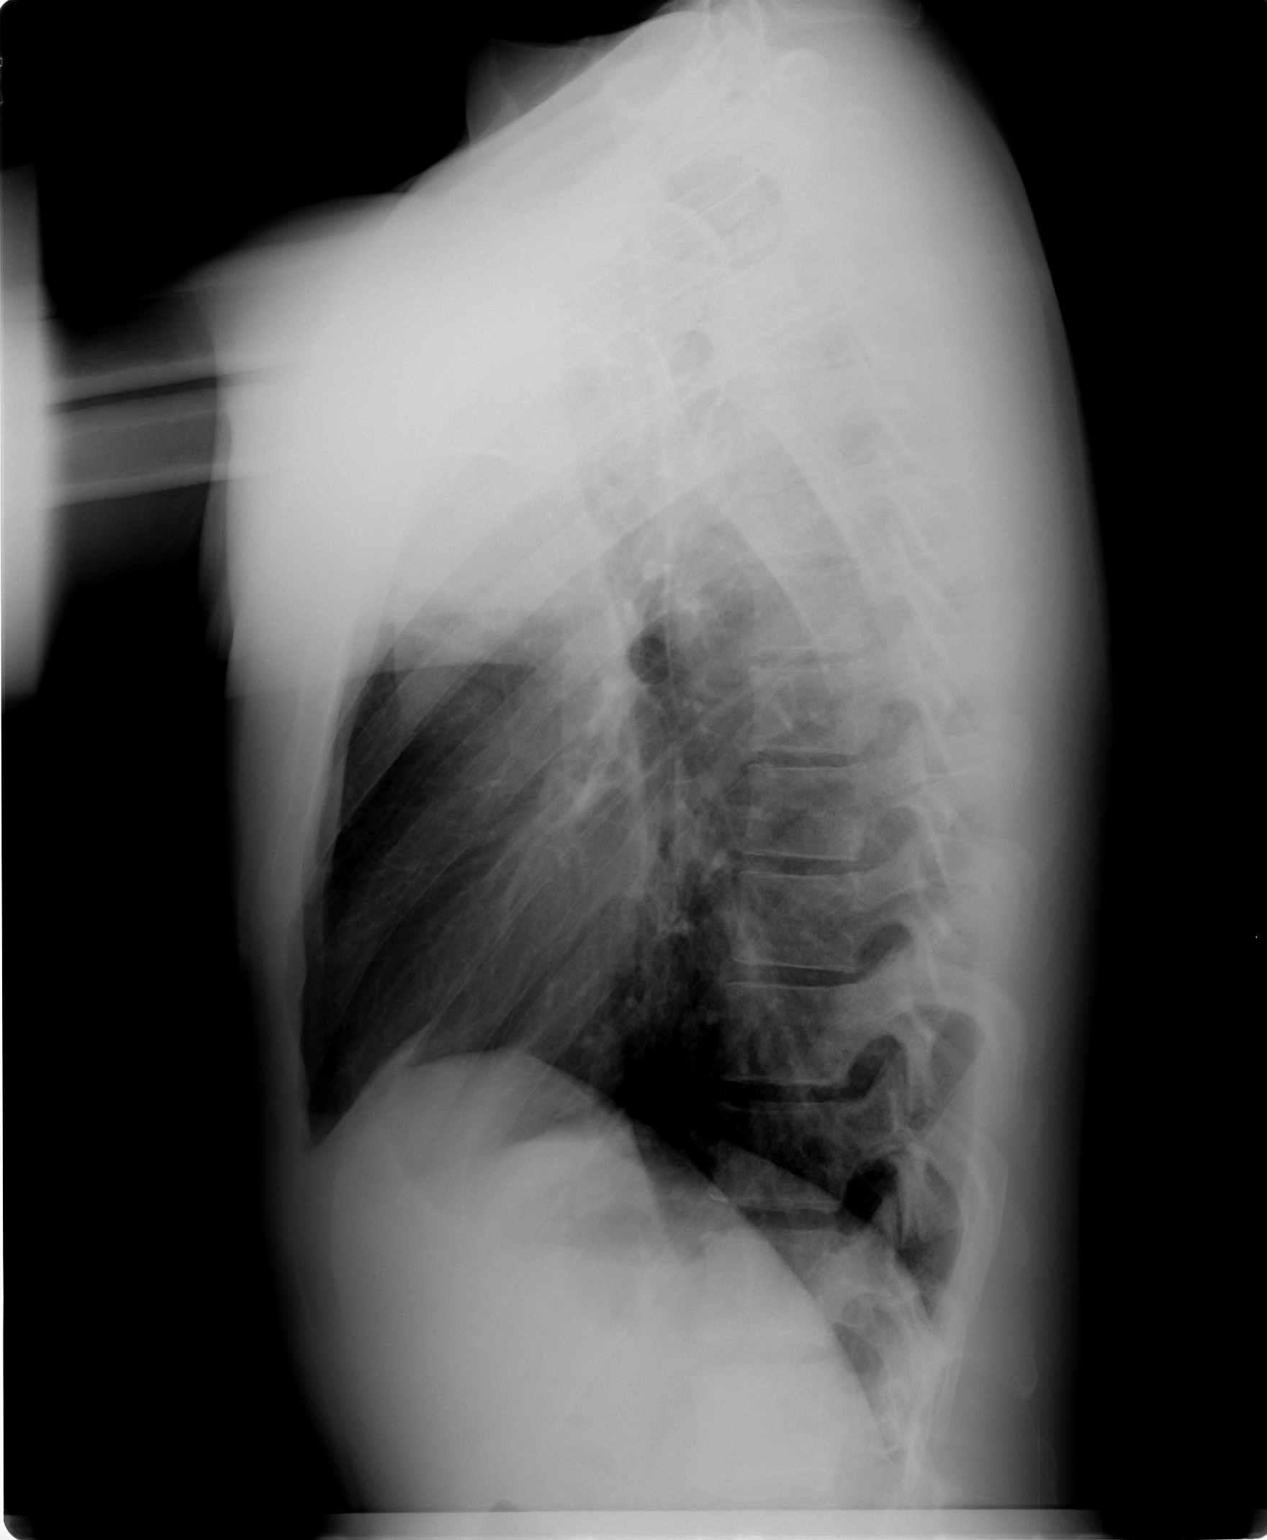

[2 of 2 positions shown; findings below may reference images not displayed]

FINDINGS: The heart size and mediastinal contours are within normal limits. 
Both lungs are clear.  The visualized skeletal structures are unremarkable.
IMPRESSION: No active cardiopulmonary disease

## 2009-08-11 ENCOUNTER — Emergency Department (HOSPITAL_COMMUNITY): Admission: EM | Admit: 2009-08-11 | Discharge: 2009-08-11 | Payer: Self-pay | Admitting: Emergency Medicine

## 2009-08-20 ENCOUNTER — Ambulatory Visit: Payer: Self-pay | Admitting: Cardiovascular Disease

## 2009-08-20 ENCOUNTER — Encounter: Payer: Self-pay | Admitting: Physician Assistant

## 2009-08-20 ENCOUNTER — Inpatient Hospital Stay (HOSPITAL_COMMUNITY): Admission: EM | Admit: 2009-08-20 | Discharge: 2009-08-22 | Payer: Self-pay | Admitting: Emergency Medicine

## 2009-08-21 ENCOUNTER — Encounter (INDEPENDENT_AMBULATORY_CARE_PROVIDER_SITE_OTHER): Payer: Self-pay | Admitting: Internal Medicine

## 2009-08-26 ENCOUNTER — Ambulatory Visit: Payer: Self-pay | Admitting: Physician Assistant

## 2009-08-26 DIAGNOSIS — R51 Headache: Secondary | ICD-10-CM | POA: Insufficient documentation

## 2009-08-26 DIAGNOSIS — E785 Hyperlipidemia, unspecified: Secondary | ICD-10-CM | POA: Insufficient documentation

## 2009-08-26 DIAGNOSIS — Z720 Tobacco use: Secondary | ICD-10-CM | POA: Insufficient documentation

## 2009-08-26 DIAGNOSIS — R519 Headache, unspecified: Secondary | ICD-10-CM | POA: Insufficient documentation
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
## 2010-02-04 ENCOUNTER — Emergency Department (HOSPITAL_COMMUNITY): Admission: EM | Admit: 2010-02-04 | Discharge: 2010-02-04 | Payer: Self-pay | Admitting: Emergency Medicine

## 2010-02-23 IMAGING — CR DG ANKLE COMPLETE 3+V*L*
3 series · 3 of 3 positions shown · non-contrast
Comparison: Radiographs left ankle 01/11/2007

CLINICAL DATA: Fall with pain.  Prior calcaneal fracture

LEFT ANKLE COMPLETE - 3+ VIEW

[t ankle joint ap left]
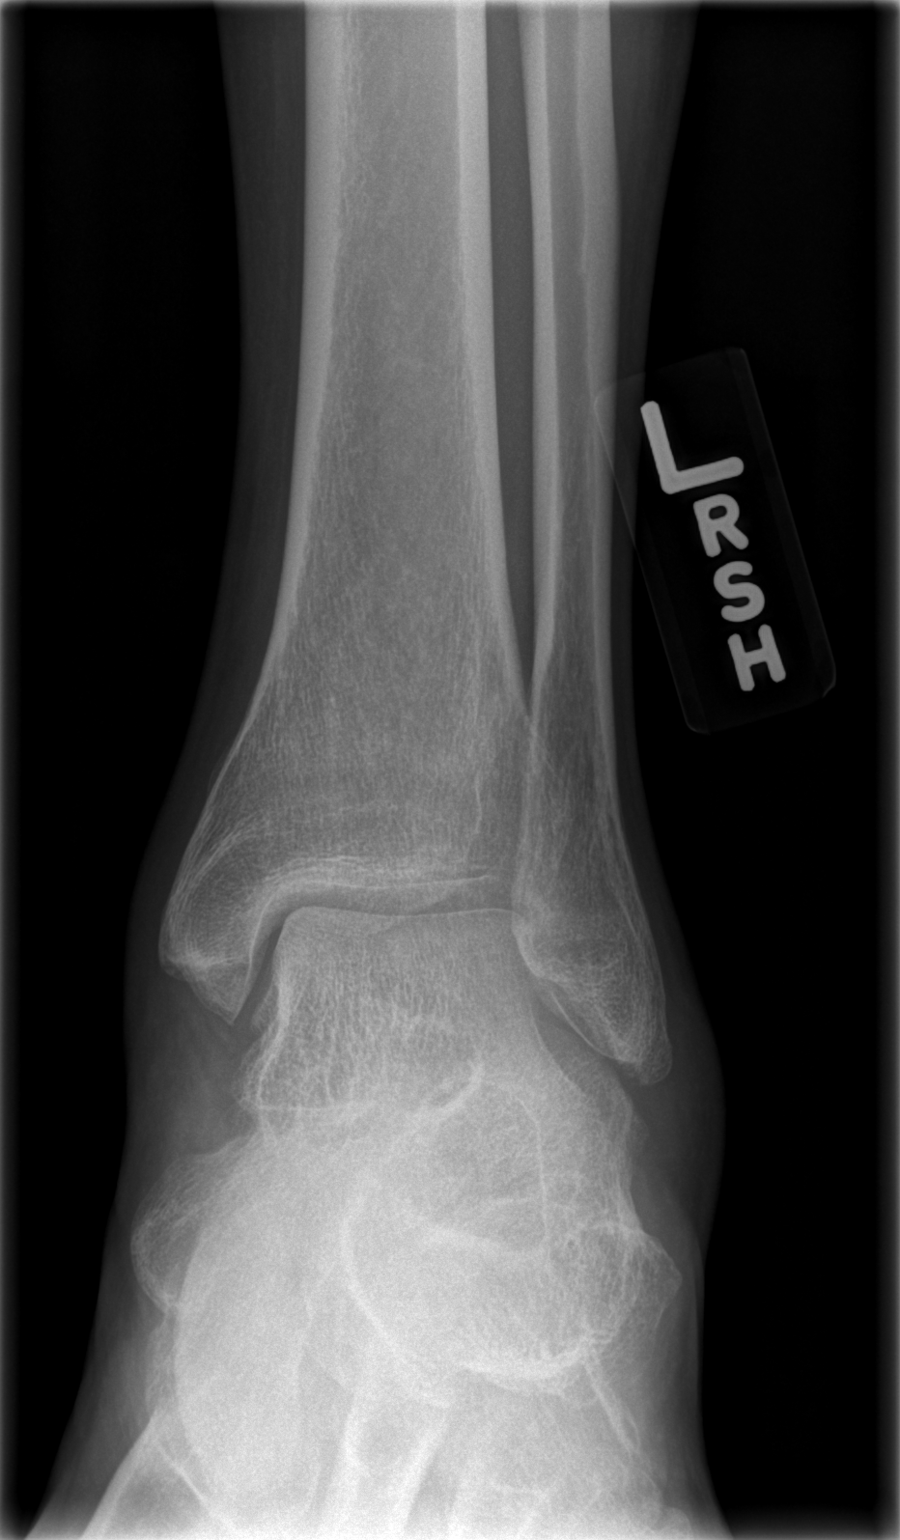

[t ankle joint oblique left]
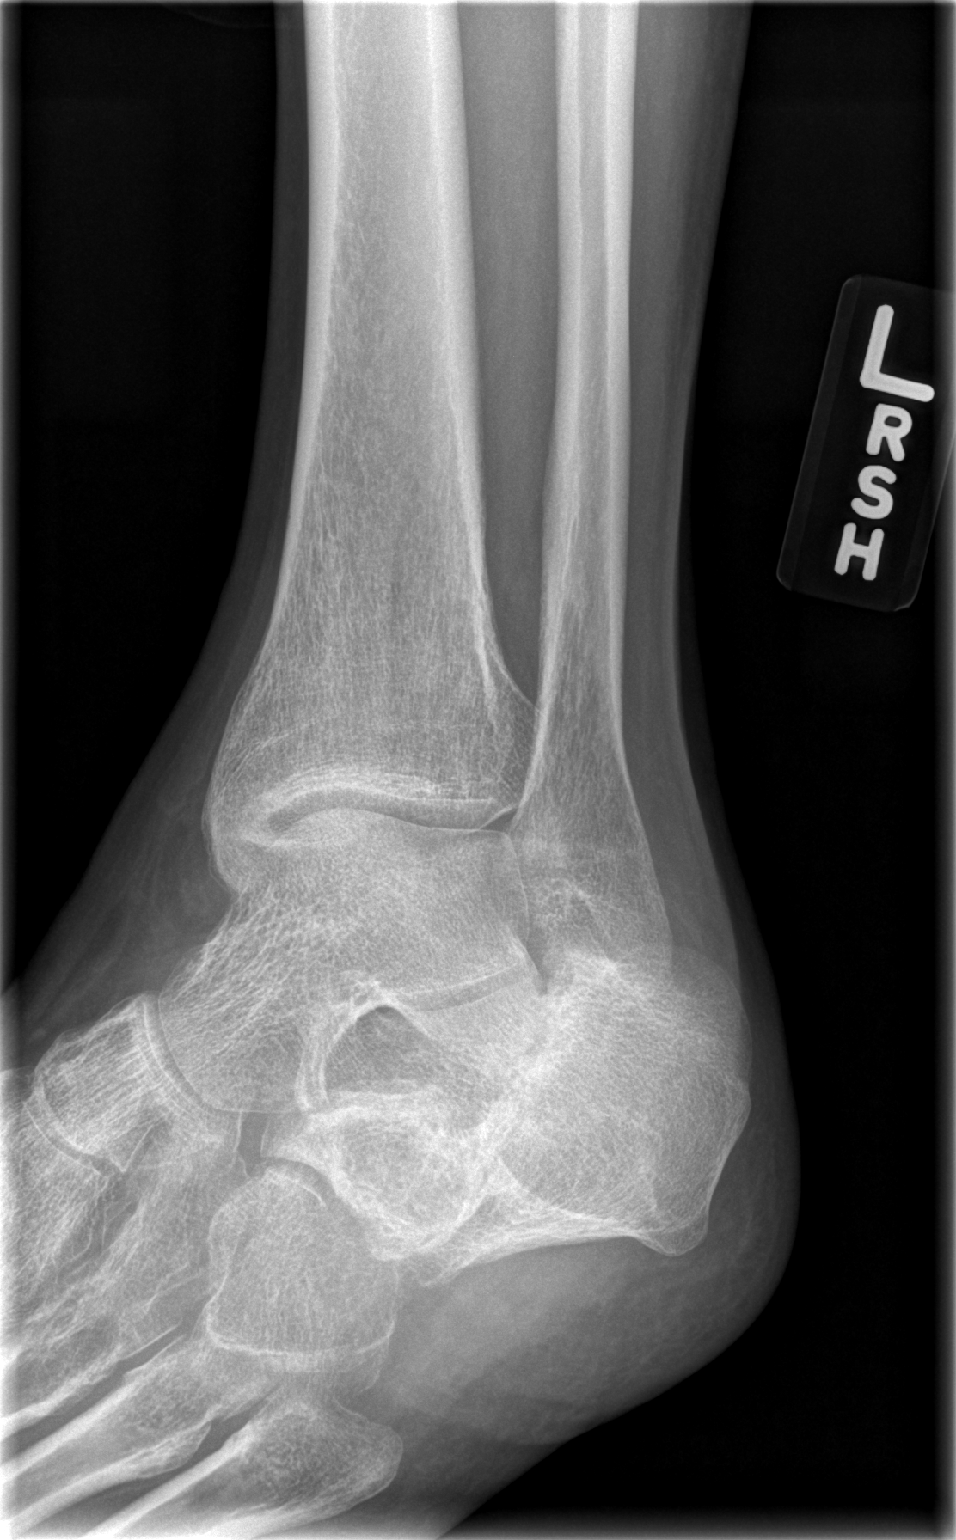

[t ankle joint lat left]
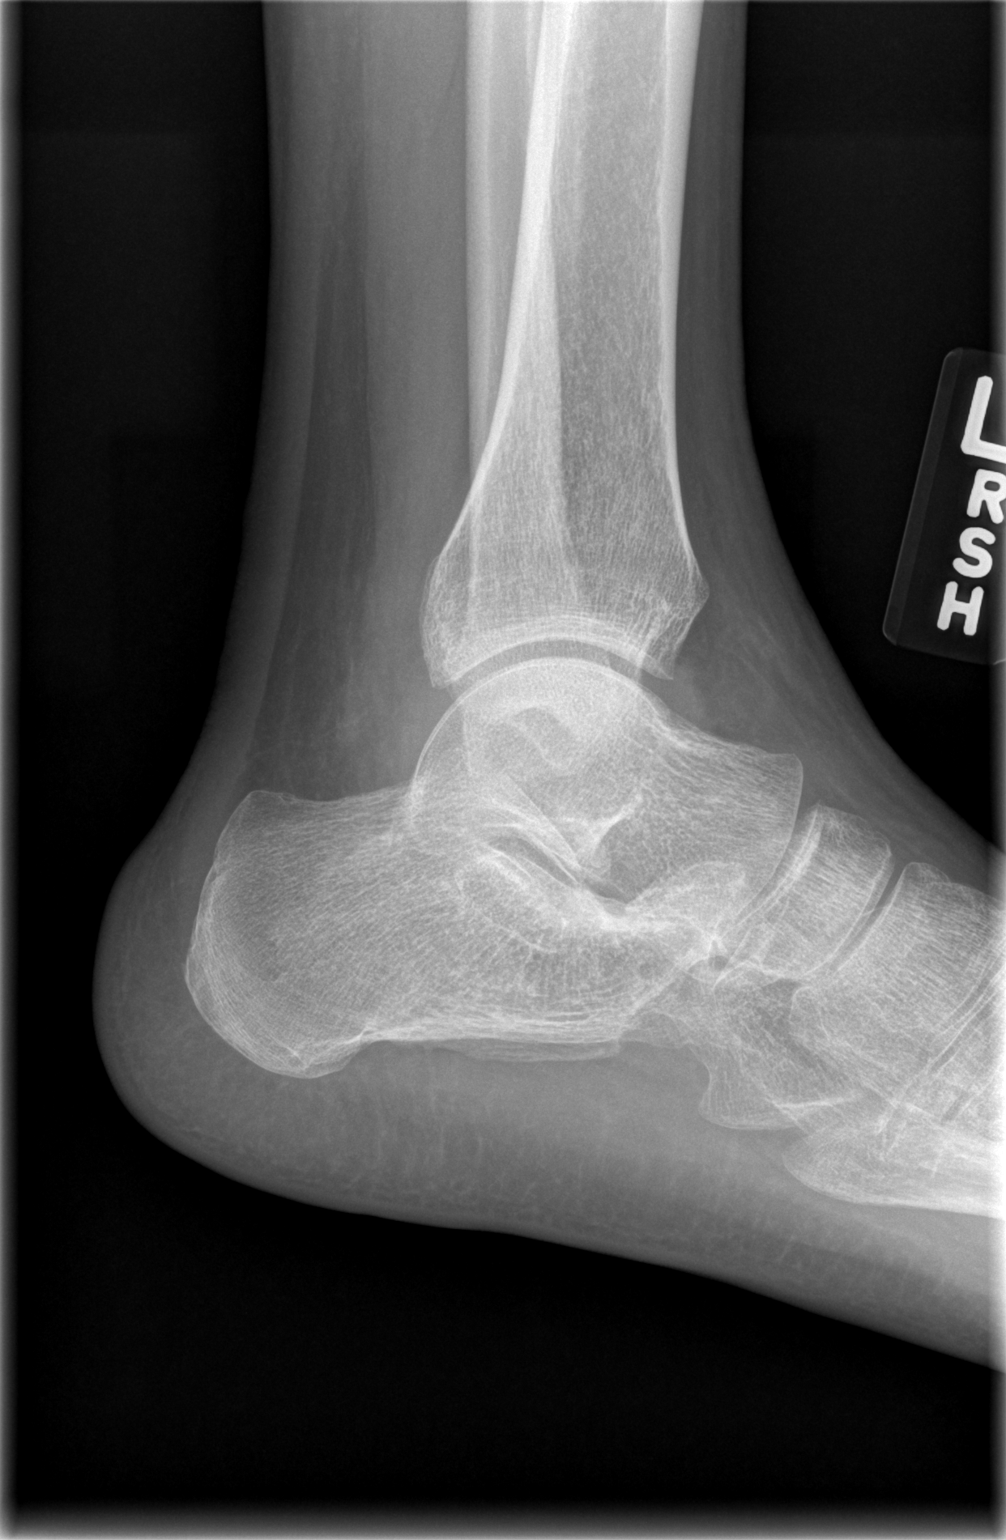

[3 of 3 positions shown; findings below may reference images not displayed]

FINDINGS: The ankle mortise is intact.  Talar dome appears normal.
No significant soft tissue swelling.  Healing fracture of the
calcaneal neck.
IMPRESSION: 1.  No evidence of acute fracture.

## 2010-03-23 ENCOUNTER — Emergency Department (HOSPITAL_COMMUNITY)
Admission: EM | Admit: 2010-03-23 | Discharge: 2010-03-23 | Payer: Self-pay | Source: Home / Self Care | Admitting: Emergency Medicine

## 2010-03-31 LAB — DIFFERENTIAL
Basophils Absolute: 0 10*3/uL (ref 0.0–0.1)
Basophils Relative: 0 % (ref 0–1)
Eosinophils Absolute: 0.1 10*3/uL (ref 0.0–0.7)
Eosinophils Relative: 2 % (ref 0–5)
Lymphocytes Relative: 29 % (ref 12–46)
Lymphs Abs: 1.6 10*3/uL (ref 0.7–4.0)
Monocytes Absolute: 0.6 10*3/uL (ref 0.1–1.0)
Monocytes Relative: 11 % (ref 3–12)
Neutro Abs: 3.2 10*3/uL (ref 1.7–7.7)
Neutrophils Relative %: 58 % (ref 43–77)

## 2010-03-31 LAB — CBC
HCT: 38.5 % — ABNORMAL LOW (ref 39.0–52.0)
Hemoglobin: 12.9 g/dL — ABNORMAL LOW (ref 13.0–17.0)
MCH: 31.5 pg (ref 26.0–34.0)
MCHC: 33.5 g/dL (ref 30.0–36.0)
MCV: 94.1 fL (ref 78.0–100.0)
Platelets: 166 10*3/uL (ref 150–400)
RBC: 4.09 MIL/uL — ABNORMAL LOW (ref 4.22–5.81)
RDW: 13 % (ref 11.5–15.5)
WBC: 5.6 10*3/uL (ref 4.0–10.5)

## 2010-03-31 LAB — COMPREHENSIVE METABOLIC PANEL
ALT: 18 U/L (ref 0–53)
AST: 17 U/L (ref 0–37)
Albumin: 3.5 g/dL (ref 3.5–5.2)
Alkaline Phosphatase: 78 U/L (ref 39–117)
BUN: 7 mg/dL (ref 6–23)
CO2: 24 mEq/L (ref 19–32)
Calcium: 9.1 mg/dL (ref 8.4–10.5)
Chloride: 107 mEq/L (ref 96–112)
Creatinine, Ser: 1.13 mg/dL (ref 0.4–1.5)
GFR calc Af Amer: >60 mL/min (ref >60)
GFR calc non Af Amer: >60 mL/min (ref >60)
Glucose, Bld: 98 mg/dL (ref 70–99)
Potassium: 3.8 mEq/L (ref 3.5–5.1)
Sodium: 138 mEq/L (ref 135–145)
Total Bilirubin: 0.6 mg/dL (ref 0.3–1.2)
Total Protein: 6.7 g/dL (ref 6.0–8.3)

## 2010-03-31 LAB — LIPASE, BLOOD: Lipase: 18 U/L (ref 11–59)

## 2010-03-31 LAB — URINALYSIS, ROUTINE W REFLEX MICROSCOPIC
Bilirubin Urine: NEGATIVE
Hgb urine dipstick: NEGATIVE
Ketones, ur: NEGATIVE mg/dL
Nitrite: NEGATIVE
Protein, ur: NEGATIVE mg/dL
Specific Gravity, Urine: 1.015 (ref 1.005–1.030)
Urine Glucose, Fasting: NEGATIVE mg/dL
Urobilinogen, UA: 0.2 mg/dL (ref 0.0–1.0)
pH: 6.5 (ref 5.0–8.0)

## 2010-03-31 LAB — OCCULT BLOOD, POC DEVICE: Fecal Occult Bld: NEGATIVE

## 2010-04-07 ENCOUNTER — Encounter: Payer: Self-pay | Admitting: Orthopedic Surgery

## 2010-04-17 NOTE — Assessment & Plan Note (Signed)
Summary: HEADACHE//GK   Vital Signs:  Patient profile:   40 year old male Height:      71 inches Weight:      179 pounds Temp:     9.0 degrees F oral Pulse rate:   73 / minute Pulse rhythm:   regular Resp:     18 per minute BP sitting:   163 / 129  (left arm) Cuff size:   regular  Vitals Entered By: [place] Shannon (August 26, 2009 10:22 AM)  Serial Vital Signs/Assessments:  Time      Position  BP       Pulse  Resp  Temp     By 11:54 AM            148/100                        Richardson Dopp PA-C 11:54 AM            150/110                        Richardson Dopp PA-C  Comments: 11:54 AM left arm  By: Richardson Dopp PA-C  11:54 AM right arm By: Richardson Dopp PA-C   CC: still having morning headaches daily, needs Rx prescribed..., Headache Is Patient Diabetic? No Pain Assessment Patient in pain? no       Does patient need assistance? Functional Status Self care Ambulation Normal   Primary Care Provider:  Richardson Dopp PA-C  CC:  still having morning headaches daily, needs Rx prescribed..., and Headache.  History of Present Illness: Here for f/u on headaches.  Found out when I entered the room, he was just in the hospital.  Reviewed records with patient in room.  Was admitted with bronchitis and treated with inpatient Avelox.  D/c after couple days on 4 more days of Avelox.  Dillyn Killeen states he was able to fill med and is taking.  Cough has improved.  Also, BP was 160/120s when he was admitted.  He was recently in the ED.  No mention in ER record about him being on meds for BP at home.  Patient states he was taking Lisinopril/HCT at that time.  He was placed on Metoprolol.  As inpatient, he was started on Amlodipine and HCTZ.  BP's were 150s/90s at discharge.  Again, patient no longer taking Lisinopril/HCT.  Says doctors in hospital told him to stop, but no mention of this in d/c summary.  Still reporting headaches.  States he takes percocet for these.  Getting percocet via Dr.  Marcelino Scot for his ankle.  Had recent surgery.  He was told that narcotics are not good medicines for headaches.  Suspect his headaches are related to uncontrolled blood pressure. No meds today.  Taking meds at 12pm.  Notes h/o headaches for years.  Started after "acid injury" to right eye.  No ophthalmological f/u.  Notes scotoma.  Has bilateral occipital headache.  No neck injury.  No radicular symptoms.  Does note some tingling in fingertips.  No photophobia but does note nausea.   Current Medications (verified): 1)  Norvasc 10 Mg Tabs (Amlodipine Besylate) .[name].. 1 By Mouth Daily 2)  Vicodin 5-500 Mg Tabs (Hydrocodone-Acetaminophen) .... Take 1 By Mouth Every 8 Hours As Needed For Severe Pain 3)  Aspir-Low 81 Mg Tbec (Aspirin) .[name].. 1 Tab By Mouth Daily 4)  Tussin Dm 100-10 Mg/12ml Syrp (Dextromethorphan-Guaifenesin) .... 5 Millileters By Mouth Every  4 Hours 5)  Hydrochlorothiazide 25 Mg Tabs (Hydrochlorothiazide) .[name].. 1 Tab My Mouth Daily 6)  Loratadine 10 Mg Tabs (Loratadine) .[name].. 1 Tab By Mouth Daily 7)  Avelox 400 Mg Tabs (Moxifloxacin Hcl) .[name].. 1 Tab By Mouth Daily 8)  Simvastatin 10 Mg Tabs (Simvastatin) .[name].. 1 Tab By Mouth Daily 9)  Proair Hfa 108 (90 Base) Mcg/act Aers (Albuterol Sulfate) .... 2 Puffs Enhaled Every Four Hours As Needed  Allergies (verified): No Known Drug Allergies  Physical Exam  General:  alert, well-developed, and well-nourished.   Head:  normocephalic and atraumatic.   Neck:  supple and no cervical lymphadenopathy.   Lungs:  normal breath sounds, no crackles, and no wheezes.   Heart:  normal rate and regular rhythm.   Extremities:  no edema  Neurologic:  alert & oriented X3 and cranial nerves II-XII intact.   BUE strength normal and equal bilat biceps and brachioradialis DTRs 2+ bilat  Psych:  normally interactive and good eye contact.     Impression & Recommendations:  Problem # 1:  HYPERTENSION (ICD-401.9) uncontrolled will put him back on ACE cbc,  bmet and tsh all normal in hospital  The following medications were removed from the medication list:    Lisinopril-hydrochlorothiazide 20-12.5 Mg Tabs (Lisinopril-hydrochlorothiazide) .[name]..[name] Two tablets by mouth daily His updated medication list for this problem includes:    Norvasc 10 Mg Tabs (Amlodipine besylate) .[name]... 1 by mouth daily    Metoprolol Tartrate 50 Mg Tabs (Metoprolol tartrate) .[name]... Take 1 tablet by mouth two times a day for blood pressure    Lisinopril-hydrochlorothiazide 20-25 Mg Tabs (Lisinopril-hydrochlorothiazide) .[name]... Take 1 tablet by mouth once a day for blood pressure  Problem # 2:  HEADACHE (ICD-784.0) Head CT negative in ED in May, 2011 warned him of dangers of using narc's for headaches ? tension headaches + scotoma . . . ?atypical migraines check neck xray add sedapap to use as needed also, give flexeril to use as needed advised him to see eye doctor consider referral to neuro at f/u   His updated medication list for this problem includes:    Vicodin 5-500 Mg Tabs (Hydrocodone-acetaminophen) .[name]... Take 1 by mouth every 8 hours as needed for severe pain    Aspir-low 81 Mg Tbec (Aspirin) .[name]... 1 tab by mouth daily    Metoprolol Tartrate 50 Mg Tabs (Metoprolol tartrate) .[name]... Take 1 tablet by mouth two times a day for blood pressure    Sedapap 50-650 Mg Tabs (Butalbital-acetaminophen) .[name]... Take one tab every 4 hours as needed for headache.  do not use more than 6 tabs in one day.   not intended to be used on daily basis.  Orders: Diagnostic X-Ray/Fluoroscopy (Diagnostic X-Ray/Flu)  Problem # 3:  HYPERLIPIDEMIA (ICD-272.4) LDL 137 in hospital now on simvastatin get f/u FLP and LFTS in 3 mos  His updated medication list for this problem includes:    Simvastatin 10 Mg Tabs (Simvastatin) .[name]... 1 tab by mouth daily  Problem # 4:  SMOKER (ICD-305.1) bronchitis clearing refill inhaler  advised tobacco cessation  Complete Medication List: 1)  Norvasc 10  Mg Tabs (Amlodipine besylate) .[name].. 1 by mouth daily 2)  Vicodin 5-500 Mg Tabs (Hydrocodone-acetaminophen) .... Take 1 by mouth every 8 hours as needed for severe pain 3)  Aspir-low 81 Mg Tbec (Aspirin) .[name].. 1 tab by mouth daily 4)  Tussin Dm 100-10 Mg/57ml Syrp (Dextromethorphan-guaifenesin) .... 5 millileters by mouth every 4 hours 5)  Loratadine 10 Mg Tabs (Loratadine) .Marland KitchenMarland KitchenMarland Kitchen  1 tab by mouth daily 6)  Avelox 400 Mg Tabs (Moxifloxacin hcl) .[name].. 1 tab by mouth daily 7)  Simvastatin 10 Mg Tabs (Simvastatin) .[name].. 1 tab by mouth daily 8)  Proair Hfa 108 (90 Base) Mcg/act Aers (Albuterol sulfate) .... 2 puffs enhaled every four hours as needed 9)  Metoprolol Tartrate 50 Mg Tabs (Metoprolol tartrate) .... Take 1 tablet by mouth two times a day for blood pressure 10)  Sedapap 50-650 Mg Tabs (Butalbital-acetaminophen) .... Take one tab every 4 hours as needed for headache.  do not use more than 6 tabs in one day.   not intended to be used on daily basis. 11)  Flexeril 10 Mg Tabs (Cyclobenzaprine hcl) .... Take 1 tab by mouth at bedtime as needed for headaches 12)  Lisinopril-hydrochlorothiazide 20-25 Mg Tabs (Lisinopril-hydrochlorothiazide) .... Take 1 tablet by mouth once a day for blood pressure  Patient Instructions: 1)  Stop the HCTZ. 2)  Start Lisinopril/HCTZ 20/25 mg once daily. 3)  Take blood pressure medicines in the morning.  Take Metoprolol two times a day (12 hours between each dose). 4)  Use Butalbital/APAP as needed for headaches.  This medicine has Tylenol in it.  Vicodin and Percocet both have Tylenol in them.  DO NOT TAKE THESE MEDICINES TOGETHER OR TAKE TYLENOL WITH IT.  You do not want to take too much Tylenol because it can cause liver damage.   5)  Take the Flexeril at bedtime as needed for headaches. 6)  Schedule BMET and Blood Pressure check in lab in 2 weeks with nurse.  Make sure you take your BP medicines before coming in to the appointment. 7)  Schedule FLP and LFTs in 3 months in  the lab (272.4). 8)  Tobacco is very bad for your health and your loved ones ! You should stop smoking !  9)  Stop smoking tips: Choose a quit date. Cut down before the quit date. Decide what you will do as a substitute when you feel the urge to smoke(gum, toothpick, exercise).  Prescriptions: SEDAPAP 50-650 MG TABS (BUTALBITAL-ACETAMINOPHEN) Take one tab every 4 hours as needed for headache.  Do not use more than 6 tabs in one day.   Not intended to be used on daily basis.  #30 x 0   Entered and Authorized by:   Richardson Dopp PA-C   Signed by:   Richardson Dopp PA-C on 08/26/2009   Method used:   Print then Give to Patient   RxID:   [id number] LISINOPRIL-HYDROCHLOROTHIAZIDE 20-25 MG TABS (LISINOPRIL-HYDROCHLOROTHIAZIDE) Take 1 tablet by mouth once a day for blood pressure  #30 x 5   Entered and Authorized by:   Richardson Dopp PA-C   Signed by:   Richardson Dopp PA-C on 08/26/2009   Method used:   Print then Give to Patient   RxID:   [id number] FLEXERIL 10 MG TABS (CYCLOBENZAPRINE HCL) Take 1 tab by mouth at bedtime as needed for headaches  #30 x 1   Entered and Authorized by:   Richardson Dopp PA-C   Signed by:   Richardson Dopp PA-C on 08/26/2009   Method used:   Print then Give to Patient   RxID:   [id number] METOPROLOL TARTRATE 50 MG TABS (METOPROLOL TARTRATE) Take 1 tablet by mouth two times a day for blood pressure  #60 x 5   Entered and Authorized by:   Richardson Dopp PA-C   Signed by:   Richardson Dopp PA-C on 08/26/2009   Method used:  Print then Give to Patient   RxID:   [id number] PROAIR HFA 108 (90 BASE) MCG/ACT AERS (ALBUTEROL SULFATE) 2 puffs enhaled every four hours as needed  #1 x 5   Entered and Authorized by:   Richardson Dopp PA-C   Signed by:   Richardson Dopp PA-C on 08/26/2009   Method used:   Print then Give to Patient   RxID:   [id number] SIMVASTATIN 10 MG TABS (SIMVASTATIN) 1 tab by mouth daily  #30 x 5   Entered and Authorized by:   Richardson Dopp PA-C    Signed by:   Richardson Dopp PA-C on 08/26/2009   Method used:   Print then Give to Patient   RxID:   [id number] Lyles 10 MG TABS (AMLODIPINE BESYLATE) 1 by mouth daily  #30 x 5   Entered and Authorized by:   Richardson Dopp PA-C   Signed by:   Richardson Dopp PA-C on 08/26/2009   Method used:   Print then Give to Patient   RxID:   [id number]

## 2010-04-17 NOTE — Letter (Signed)
Summary: Discharge Summary  Discharge Summary   Imported By: Roland Earl 09/03/2009 15:39:28  _____________________________________________________________________  External Attachment:    Type:   Image     Comment:   External Document

## 2010-04-17 NOTE — Letter (Signed)
Summary: TETST ORDER FORM//2-D ECHO//APPT DATE & TIME  TETST ORDER FORM//2-D ECHO//APPT DATE & TIME   Imported By: Roland Earl 04/04/2009 15:20:59  _____________________________________________________________________  External Attachment:    Type:   Image     Comment:   External Document

## 2010-04-17 NOTE — Letter (Signed)
Summary: ECHO  ECHO   Imported By: Roland Earl 04/30/2009 15:12:09  _____________________________________________________________________  External Attachment:    Type:   Image     Comment:   External Document

## 2010-05-05 IMAGING — CR DG NECK SOFT TISSUE
2 series · 2 of 2 positions shown · non-contrast
Comparison: Cervical spine films dated 07/30/2006

CLINICAL DATA: Throat swelling

NECK SOFT TISSUES - ONE VIEW

[w soft tissue neck (1 of 2)]
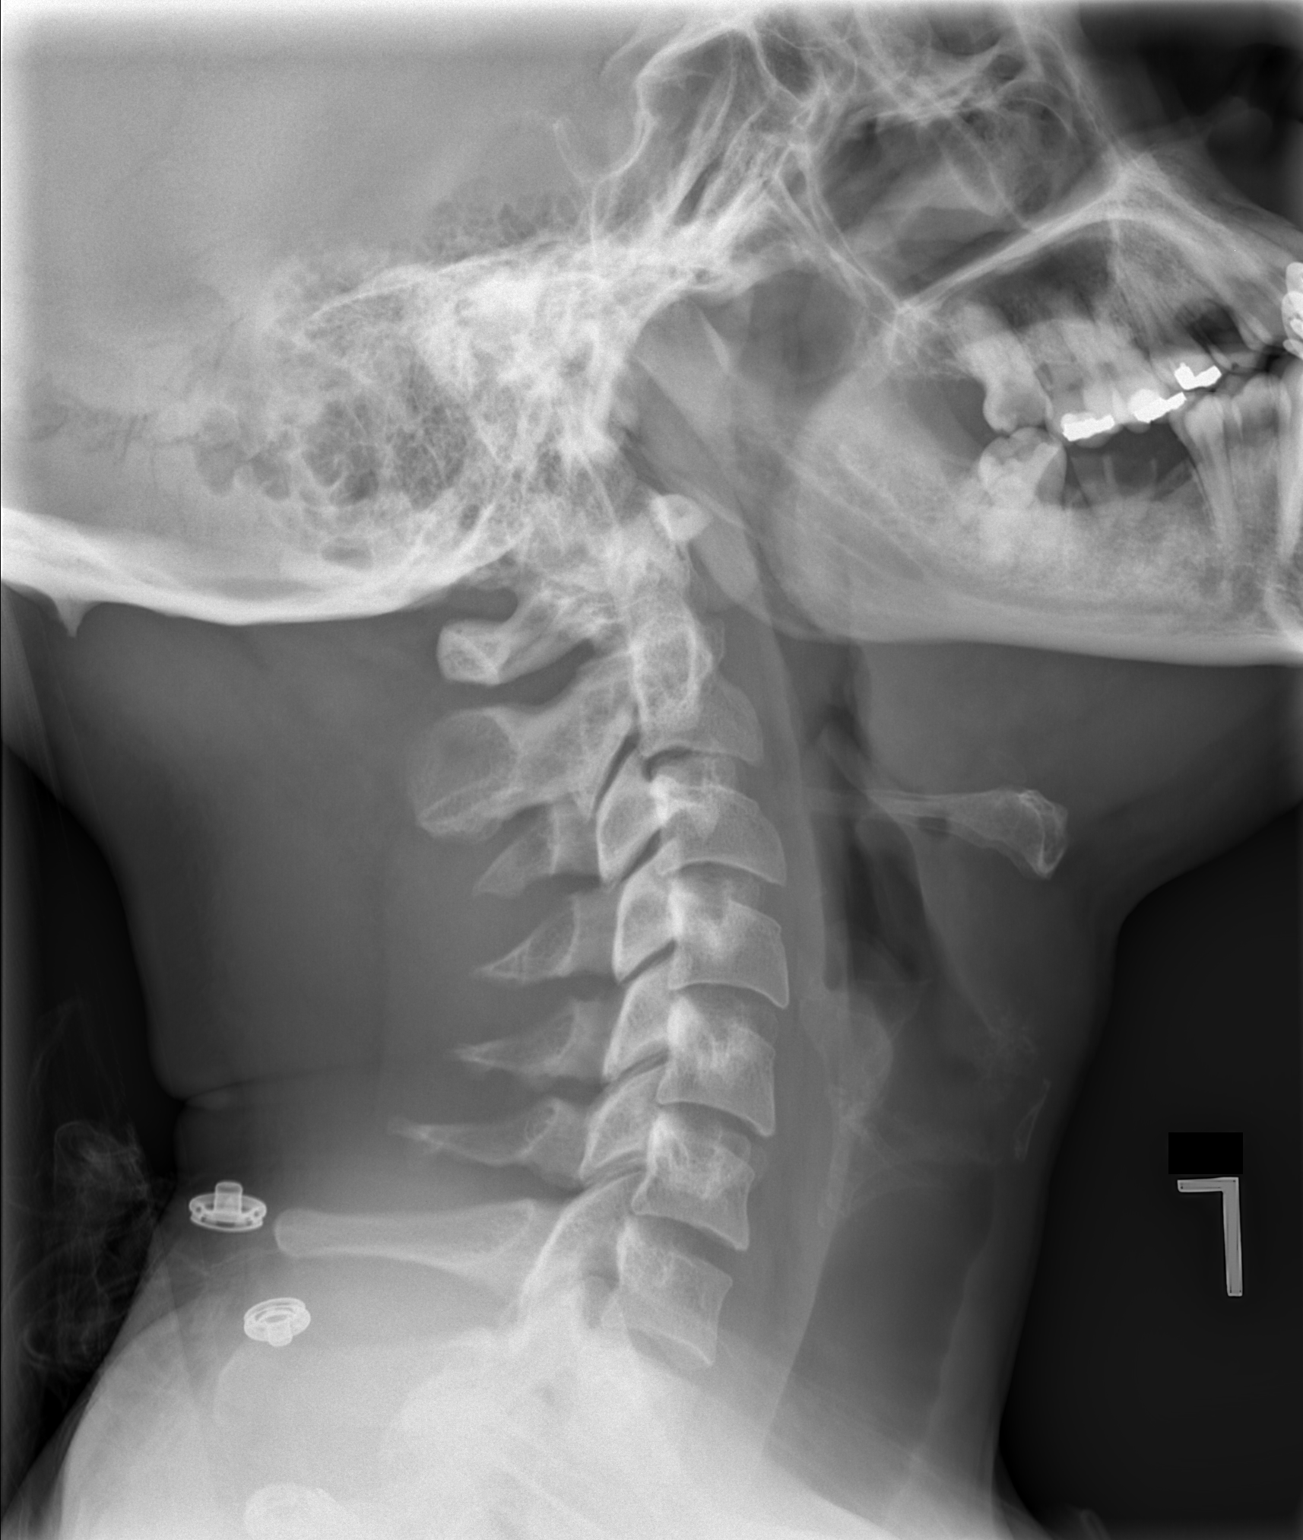

[w soft tissue neck (2 of 2)]
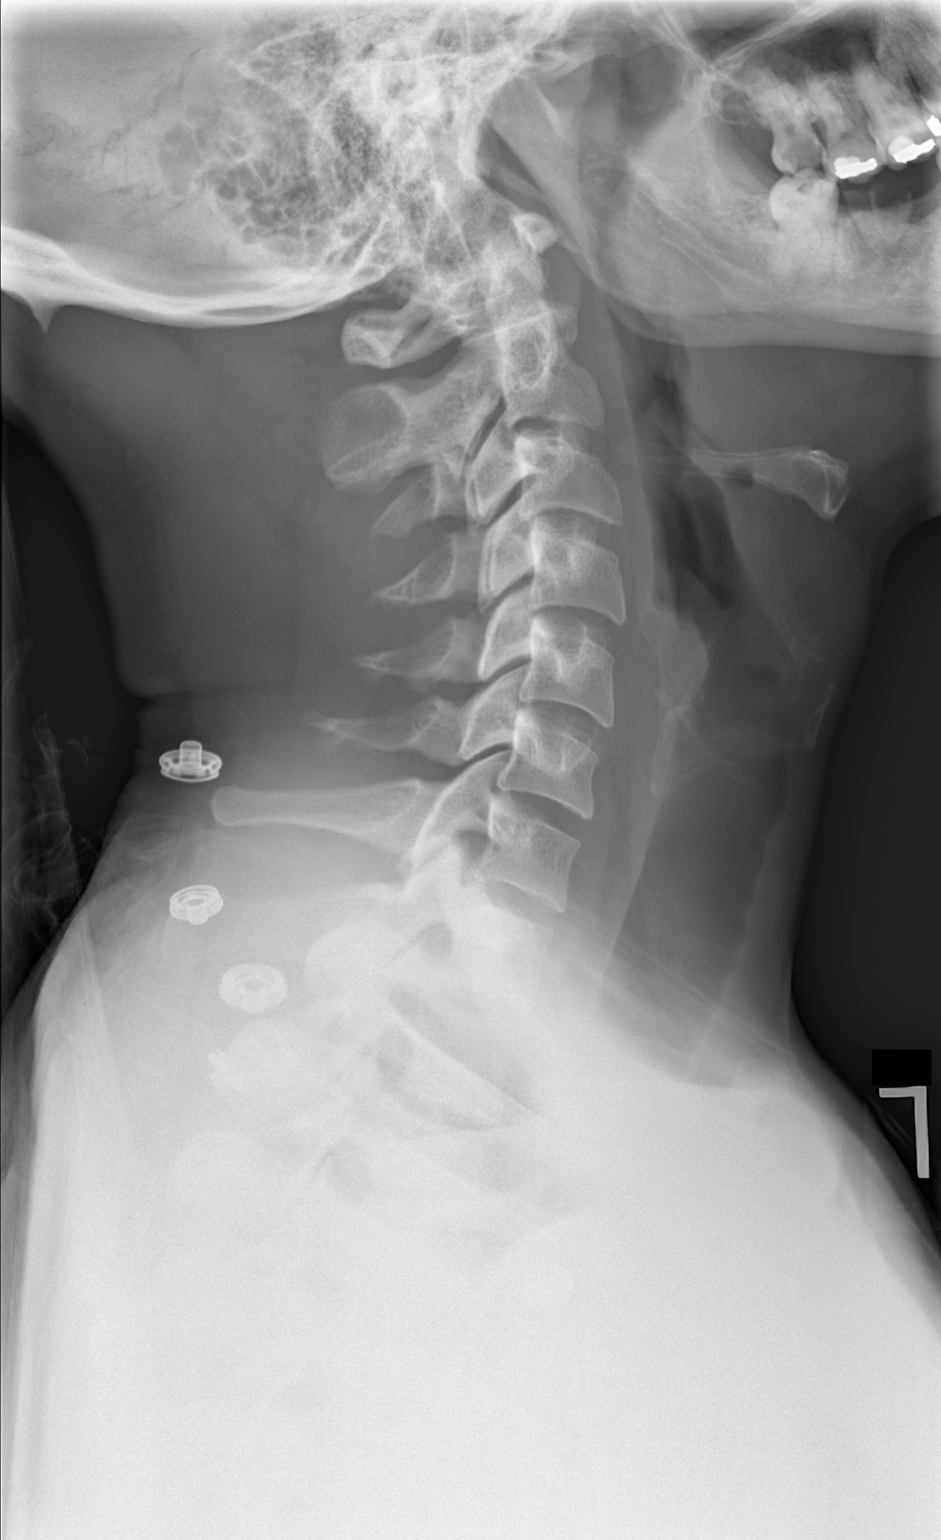

[2 of 2 positions shown; findings below may reference images not displayed]

FINDINGS: Soft tissues of the neck appear unremarkable with no
evidence of airway narrowing.  No prevertebral soft tissue
prominence is seen.  The epiglottis and aryepiglottic folds are
well delineated and normal in appearance.  If significant focal
process is suspected, CT evaluation may be helpful.
IMPRESSION: Unremarkable appearance of soft tissues of neck in the lateral
projection.

## 2010-05-28 LAB — COMPREHENSIVE METABOLIC PANEL
Albumin: 3.8 g/dL (ref 3.5–5.2)
BUN: 7 mg/dL (ref 6–23)
Calcium: 9.2 mg/dL (ref 8.4–10.5)
Chloride: 104 mEq/L (ref 96–112)
Creatinine, Ser: 1.31 mg/dL (ref 0.4–1.5)
Total Bilirubin: 0.8 mg/dL (ref 0.3–1.2)
Total Protein: 7 g/dL (ref 6.0–8.3)

## 2010-05-28 LAB — CBC
MCH: 33.4 pg (ref 26.0–34.0)
MCHC: 34.9 g/dL (ref 30.0–36.0)
MCV: 95.7 fL (ref 78.0–100.0)
Platelets: 153 10*3/uL (ref 150–400)
RDW: 13.3 % (ref 11.5–15.5)

## 2010-05-28 LAB — POCT CARDIAC MARKERS
CKMB, poc: <1 ng/mL — ABNORMAL LOW (ref 1.0–8.0)
Myoglobin, poc: 46.5 ng/mL (ref 12–200)

## 2010-05-28 LAB — DIFFERENTIAL
Basophils Absolute: 0 10*3/uL (ref 0.0–0.1)
Lymphocytes Relative: 40 % (ref 12–46)
Monocytes Absolute: 0.6 10*3/uL (ref 0.1–1.0)
Neutro Abs: 2.3 10*3/uL (ref 1.7–7.7)

## 2010-06-02 LAB — BASIC METABOLIC PANEL
CO2: 24 mEq/L (ref 19–32)
CO2: 26 mEq/L (ref 19–32)
Calcium: 9.3 mg/dL (ref 8.4–10.5)
Chloride: 102 mEq/L (ref 96–112)
Creatinine, Ser: 1.02 mg/dL (ref 0.4–1.5)
Creatinine, Ser: 1.15 mg/dL (ref 0.4–1.5)
GFR calc Af Amer: >60 mL/min (ref >60)
Glucose, Bld: 85 mg/dL (ref 70–99)

## 2010-06-02 LAB — CBC
HCT: 43.5 % (ref 39.0–52.0)
Hemoglobin: 14.9 g/dL (ref 13.0–17.0)
MCHC: 34.4 g/dL (ref 30.0–36.0)
MCHC: 34 g/dL (ref 30.0–36.0)
MCV: 97.9 fL (ref 78.0–100.0)
Platelets: 171 10*3/uL (ref 150–400)
RBC: 4.41 MIL/uL (ref 4.22–5.81)
RBC: 4.42 MIL/uL (ref 4.22–5.81)
RDW: 14.1 % (ref 11.5–15.5)
WBC: 7.3 10*3/uL (ref 4.0–10.5)

## 2010-06-02 LAB — COMPREHENSIVE METABOLIC PANEL
ALT: 22 U/L (ref 0–53)
Albumin: 3.5 g/dL (ref 3.5–5.2)
Calcium: 9.5 mg/dL (ref 8.4–10.5)
GFR calc Af Amer: >60 mL/min (ref >60)
Glucose, Bld: 107 mg/dL — ABNORMAL HIGH (ref 70–99)
Sodium: 134 mEq/L — ABNORMAL LOW (ref 135–145)
Total Protein: 7.1 g/dL (ref 6.0–8.3)

## 2010-06-02 LAB — DIFFERENTIAL
Basophils Absolute: 0 10*3/uL (ref 0.0–0.1)
Basophils Relative: 1 % (ref 0–1)
Monocytes Absolute: 0.7 10*3/uL (ref 0.1–1.0)
Neutro Abs: 3.3 10*3/uL (ref 1.7–7.7)
Neutrophils Relative %: 49 % (ref 43–77)

## 2010-06-02 LAB — URINALYSIS, ROUTINE W REFLEX MICROSCOPIC
Bilirubin Urine: NEGATIVE
Ketones, ur: NEGATIVE mg/dL
Nitrite: NEGATIVE
Protein, ur: 30 mg/dL — AB
Urobilinogen, UA: 0.2 mg/dL (ref 0.0–1.0)

## 2010-06-02 LAB — LIPID PANEL
LDL Cholesterol: 137 mg/dL — ABNORMAL HIGH (ref 0–99)
Total CHOL/HDL Ratio: 3.3 RATIO
Triglycerides: 92 mg/dL (ref <150)
VLDL: 18 mg/dL (ref 0–40)

## 2010-06-02 LAB — CK TOTAL AND CKMB (NOT AT ARMC)
CK, MB: 1.1 ng/mL (ref 0.3–4.0)
CK, MB: 1.3 ng/mL (ref 0.3–4.0)
Relative Index: 0.6 (ref 0.0–2.5)
Total CK: 183 U/L (ref 7–232)
Total CK: 239 U/L — ABNORMAL HIGH (ref 7–232)

## 2010-06-02 LAB — RAPID URINE DRUG SCREEN, HOSP PERFORMED
Amphetamines: NOT DETECTED
Benzodiazepines: NOT DETECTED
Cocaine: NOT DETECTED
Opiates: POSITIVE — AB
Tetrahydrocannabinol: POSITIVE — AB

## 2010-06-02 LAB — POCT I-STAT, CHEM 8
BUN: 3 mg/dL — ABNORMAL LOW (ref 6–23)
Chloride: 104 mEq/L (ref 96–112)
Creatinine, Ser: 1 mg/dL (ref 0.4–1.5)
Sodium: 138 mEq/L (ref 135–145)

## 2010-06-02 LAB — POCT CARDIAC MARKERS
CKMB, poc: <1 ng/mL — ABNORMAL LOW (ref 1.0–8.0)
Myoglobin, poc: 79.7 ng/mL (ref 12–200)
Myoglobin, poc: 40.1 ng/mL (ref 12–200)
Troponin i, poc: <0.05 ng/mL (ref 0.00–0.09)

## 2010-06-02 LAB — TROPONIN I: Troponin I: 0.01 ng/mL (ref 0.00–0.06)

## 2010-06-02 LAB — URINE MICROSCOPIC-ADD ON

## 2010-06-16 LAB — CBC
HCT: 41.3 % (ref 39.0–52.0)
MCHC: 33.8 g/dL (ref 30.0–36.0)
Platelets: 222 10*3/uL (ref 150–400)
RDW: 13.1 % (ref 11.5–15.5)

## 2010-06-16 LAB — BASIC METABOLIC PANEL
BUN: 7 mg/dL (ref 6–23)
CO2: 29 mEq/L (ref 19–32)
GFR calc non Af Amer: >60 mL/min (ref >60)
Glucose, Bld: 93 mg/dL (ref 70–99)
Potassium: 3.5 mEq/L (ref 3.5–5.1)

## 2010-06-17 LAB — CBC
HCT: 41.4 % (ref 39.0–52.0)
HCT: 46.8 % (ref 39.0–52.0)
Hemoglobin: 14.2 g/dL (ref 13.0–17.0)
MCHC: 34 g/dL (ref 30.0–36.0)
MCHC: 34.2 g/dL (ref 30.0–36.0)
MCV: 99.1 fL (ref 78.0–100.0)
MCV: 99.3 fL (ref 78.0–100.0)
Platelets: 234 10*3/uL (ref 150–400)
RBC: 4.18 MIL/uL — ABNORMAL LOW (ref 4.22–5.81)
RDW: 13.2 % (ref 11.5–15.5)

## 2010-06-17 LAB — BASIC METABOLIC PANEL
BUN: 10 mg/dL (ref 6–23)
GFR calc non Af Amer: >60 mL/min (ref >60)
Glucose, Bld: 93 mg/dL (ref 70–99)
Potassium: 4.4 mEq/L (ref 3.5–5.1)

## 2010-06-17 LAB — HEMOGLOBIN A1C: Hgb A1c MFr Bld: 5.7 % (ref 4.6–6.1)

## 2010-06-17 LAB — COMPREHENSIVE METABOLIC PANEL
CO2: 26 mEq/L (ref 19–32)
Calcium: 9.1 mg/dL (ref 8.4–10.5)
Creatinine, Ser: 0.94 mg/dL (ref 0.4–1.5)
GFR calc non Af Amer: >60 mL/min (ref >60)
Glucose, Bld: 104 mg/dL — ABNORMAL HIGH (ref 70–99)

## 2010-06-17 LAB — LIPID PANEL
Cholesterol: 203 mg/dL — ABNORMAL HIGH (ref 0–200)
LDL Cholesterol: 142 mg/dL — ABNORMAL HIGH (ref 0–99)
VLDL: 9 mg/dL (ref 0–40)

## 2010-06-18 LAB — CBC
HCT: 48.8 % (ref 39.0–52.0)
MCV: 99.3 fL (ref 78.0–100.0)
RBC: 4.92 MIL/uL (ref 4.22–5.81)
WBC: 6.6 10*3/uL (ref 4.0–10.5)

## 2010-06-18 LAB — URINALYSIS, ROUTINE W REFLEX MICROSCOPIC
Bilirubin Urine: NEGATIVE
Ketones, ur: NEGATIVE mg/dL
Nitrite: NEGATIVE
Protein, ur: NEGATIVE mg/dL
Urobilinogen, UA: 0.2 mg/dL (ref 0.0–1.0)
pH: 5.5 (ref 5.0–8.0)

## 2010-06-18 LAB — COMPREHENSIVE METABOLIC PANEL
AST: 21 U/L (ref 0–37)
Alkaline Phosphatase: 75 U/L (ref 39–117)
CO2: 26 mEq/L (ref 19–32)
Chloride: 103 mEq/L (ref 96–112)
Creatinine, Ser: 1.15 mg/dL (ref 0.4–1.5)
GFR calc Af Amer: >60 mL/min (ref >60)
GFR calc non Af Amer: >60 mL/min (ref >60)
Potassium: 3.5 mEq/L (ref 3.5–5.1)
Total Bilirubin: 0.4 mg/dL (ref 0.3–1.2)

## 2010-06-18 LAB — DIFFERENTIAL
Basophils Absolute: 0 10*3/uL (ref 0.0–0.1)
Basophils Relative: 1 % (ref 0–1)
Eosinophils Absolute: 0.2 10*3/uL (ref 0.0–0.7)
Eosinophils Relative: 3 % (ref 0–5)
Lymphocytes Relative: 42 % (ref 12–46)
Monocytes Absolute: 0.5 10*3/uL (ref 0.1–1.0)

## 2010-06-18 LAB — ACETAMINOPHEN LEVEL: Acetaminophen (Tylenol), Serum: <10 ug/mL — ABNORMAL LOW (ref 10–30)

## 2010-06-18 LAB — RAPID URINE DRUG SCREEN, HOSP PERFORMED: Tetrahydrocannabinol: POSITIVE — AB

## 2010-06-19 LAB — BASIC METABOLIC PANEL
CO2: 28 mEq/L (ref 19–32)
Chloride: 102 mEq/L (ref 96–112)
Creatinine, Ser: 1.25 mg/dL (ref 0.4–1.5)
GFR calc Af Amer: >60 mL/min (ref >60)
Sodium: 138 mEq/L (ref 135–145)

## 2010-06-19 LAB — CBC
Hemoglobin: 16.6 g/dL (ref 13.0–17.0)
MCHC: 34.4 g/dL (ref 30.0–36.0)
MCV: 100.1 fL — ABNORMAL HIGH (ref 78.0–100.0)
RBC: 4.82 MIL/uL (ref 4.22–5.81)
WBC: 5.8 10*3/uL (ref 4.0–10.5)

## 2010-06-25 LAB — BASIC METABOLIC PANEL
CO2: 26 mEq/L (ref 19–32)
Calcium: 9.1 mg/dL (ref 8.4–10.5)
Chloride: 109 mEq/L (ref 96–112)
Creatinine, Ser: 1.16 mg/dL (ref 0.4–1.5)
GFR calc Af Amer: >60 mL/min (ref >60)
Glucose, Bld: 86 mg/dL (ref 70–99)
Sodium: 140 mEq/L (ref 135–145)

## 2010-07-29 NOTE — Op Note (Signed)
NAME:  [name], [name]                 ACCOUNT NO.:  [id number]   MEDICAL RECORD NO.:  [id number]           PATIENT TYPE:   LOCATION:                                 FACILITY:   PHYSICIAN:  Astrid Divine. Marcelino Scot, M.D. DATE OF BIRTH:  1970/05/01   DATE OF PROCEDURE:  01/27/2008  DATE OF DISCHARGE:                               OPERATIVE REPORT   PREOPERATIVE DIAGNOSIS:  Left calcaneus malunion.   POSTOPERATIVE DIAGNOSES:  1. Left calcaneus malunion.  2. Peroneal tendon sling disruption.   SURGEON:  Astrid Divine. Marcelino Scot, MD   ASSISTANT:  None.   ANESTHESIA:  General.   COMPLICATIONS:  None.   ESTIMATED BLOOD LOSS:  Minimal.   DRAINS:  One.   DISPOSITION:  To PACU.   CONDITION:  Stable.   BRIEF SUMMARY OF INDICATIONS FOR PROCEDURE:  [name] is a very  pleasant 40 year old male with a calcaneus fracture that I have been  attempting to operate on for well over 6 months, but I have been  precluded from doing so by excessive hypertension which has been  uncontrolled until this time.  We discussed preoperatively the risks and  benefits of surgery including the possibility of failure to restore  motion, need for further surgery, infection, nerve injury, vessel  injury, pain, arthritis, and numerous others including DVT, PE.  After  full discussion, the patient wished to proceed.   DESCRIPTION OF PROCEDURE:  [name] was taken to the operating room  after administration of preoperative antibiotics.  General anesthesia  was induced.  His left lower extremity was prepped and draped in the  usual sterile fashion.  A curvilinear incision was made from near the  tip of the fibula down across the body of the calcaneus.  A sharp  osteotome was then used to plane off the displaced portion of the  calcaneus which had actually moved cephalad in between the talus and the  fibula in the lateral gutter.  This osteotome was brought along side of  the native talus.  After carefully doing so, I was  able to identify the  subtalar joint, and with continued osteotomy was able to actually  mobilize the subtalar joint.  Peroneal tendons were noted to be  completely displaced by this malunion, and the peroneal tendon sling had  been disrupted.  Similarly, after the use of the osteotome and re-  establishment of subtalar motion both in inversion and eversion, there  was clear of instability to inversion.  Consequently, using autologous  tissue and a bone anchor with suture placed into the lateral aspect of  the calcaneus, I then reconstructed the calcaneofibular ligament.  Similarly, soft tissue was then brought over the peroneal sling in order  to allow the peroneal tendons track in the peroneal groove.  This was  quite successful and excursion of the tendons within the sling was  visualized with flexion and extension of the ankle, and I was also able  to evert quite easily.  I did not stress the repair with excessive  inversion.  The joint surfaces appeared well preserved and healthy.  The  wound was copiously irrigated and then closed in standard layered  fashion over a drain given the bleeding cancellus surface of the bone.  Furthermore, I did use some bone wax to try to reduce some of the  bleeding from this large exposed bony surface.  Sterile gentle  compressive dressing was applied.  The patient was then awakened from  anesthesia and transported to the PACU in stable condition.   PROGNOSIS:  [name] had a severe injury to his left calcaneus and  unfortunately we have been precluded from operating on this fracture up  until now because of his uncontrolled hypertension.  In spite of this,  we were able to identify and restore subtalar motion and also  reconstruct the ligaments and peroneal tendons which if stabilized  should go on to heal and return some improved kinematics to the ankle  and foot.  He will be non-weightbearing for the next 10 days or so and  then we will allow  for unrestricted range of motion as well as some  gradual weightbearing in a protected brace fashion.      Astrid Divine. Marcelino Scot, M.D.  Electronically Signed     MHH/MEDQ  D:  03/20/2008  T:  03/21/2008  Job:  [id number]

## 2010-09-08 ENCOUNTER — Emergency Department (HOSPITAL_COMMUNITY): Payer: Medicare Other

## 2010-09-08 ENCOUNTER — Inpatient Hospital Stay (HOSPITAL_COMMUNITY)
Admission: EM | Admit: 2010-09-08 | Discharge: 2010-09-11 | DRG: 305 | Disposition: A | Discharge status: Home / Self Care | Payer: Medicare Other | Attending: Family Medicine | Admitting: Family Medicine

## 2010-09-08 DIAGNOSIS — I10 Essential (primary) hypertension: Principal | ICD-10-CM | POA: Diagnosis present

## 2010-09-08 DIAGNOSIS — Z9119 Patient's noncompliance with other medical treatment and regimen: Secondary | ICD-10-CM

## 2010-09-08 DIAGNOSIS — F17200 Nicotine dependence, unspecified, uncomplicated: Secondary | ICD-10-CM | POA: Diagnosis present

## 2010-09-08 DIAGNOSIS — Z91199 Patient's noncompliance with other medical treatment and regimen due to unspecified reason: Secondary | ICD-10-CM

## 2010-09-08 DIAGNOSIS — K5900 Constipation, unspecified: Secondary | ICD-10-CM | POA: Diagnosis present

## 2010-09-08 DIAGNOSIS — N179 Acute kidney failure, unspecified: Secondary | ICD-10-CM | POA: Diagnosis present

## 2010-09-08 DIAGNOSIS — R51 Headache: Secondary | ICD-10-CM | POA: Diagnosis present

## 2010-09-08 DIAGNOSIS — E876 Hypokalemia: Secondary | ICD-10-CM | POA: Diagnosis present

## 2010-09-08 LAB — DIFFERENTIAL
Lymphs Abs: 2.4 10*3/uL (ref 0.7–4.0)
Monocytes Absolute: 0.5 10*3/uL (ref 0.1–1.0)
Monocytes Relative: 9 % (ref 3–12)
Neutro Abs: 2.1 10*3/uL (ref 1.7–7.7)
Neutrophils Relative %: 41 % — ABNORMAL LOW (ref 43–77)

## 2010-09-08 LAB — BASIC METABOLIC PANEL
BUN: 9 mg/dL (ref 6–23)
CO2: 23 mEq/L (ref 19–32)
Calcium: 8.9 mg/dL (ref 8.4–10.5)
Chloride: 103 mEq/L (ref 96–112)
Creatinine, Ser: 1.14 mg/dL (ref 0.50–1.35)
Glucose, Bld: 85 mg/dL (ref 70–99)

## 2010-09-08 LAB — URINALYSIS, ROUTINE W REFLEX MICROSCOPIC
Glucose, UA: NEGATIVE mg/dL
Hgb urine dipstick: NEGATIVE
Leukocytes, UA: NEGATIVE
pH: 5.5 (ref 5.0–8.0)

## 2010-09-08 LAB — CBC
Hemoglobin: 15.1 g/dL (ref 13.0–17.0)
MCH: 34 pg (ref 26.0–34.0)
MCHC: 36.8 g/dL — ABNORMAL HIGH (ref 30.0–36.0)
MCV: 92.3 fL (ref 78.0–100.0)
RBC: 4.44 MIL/uL (ref 4.22–5.81)

## 2010-09-08 LAB — URINE MICROSCOPIC-ADD ON

## 2010-09-08 LAB — RAPID URINE DRUG SCREEN, HOSP PERFORMED
Amphetamines: NOT DETECTED
Opiates: NOT DETECTED

## 2010-09-08 MED ORDER — IOHEXOL 300 MG/ML  SOLN
100.0000 mL | Freq: Once | INTRAMUSCULAR | Status: AC | PRN
  Administered 2010-09-08: 100 mL via INTRAVENOUS

## 2010-09-09 ENCOUNTER — Emergency Department (HOSPITAL_COMMUNITY): Payer: Medicare Other

## 2010-09-09 DIAGNOSIS — I359 Nonrheumatic aortic valve disorder, unspecified: Secondary | ICD-10-CM

## 2010-09-09 LAB — CK TOTAL AND CKMB (NOT AT ARMC): Relative Index: 1.6 (ref 0.0–2.5)

## 2010-09-09 LAB — CBC
HCT: 43.3 % (ref 39.0–52.0)
Hemoglobin: 15.4 g/dL (ref 13.0–17.0)
MCHC: 35.6 g/dL (ref 30.0–36.0)
MCV: 93.5 fL (ref 78.0–100.0)

## 2010-09-09 LAB — HEPATIC FUNCTION PANEL
ALT: 12 U/L (ref 0–53)
AST: 13 U/L (ref 0–37)
Albumin: 3.2 g/dL — ABNORMAL LOW (ref 3.5–5.2)
Total Bilirubin: 0.4 mg/dL (ref 0.3–1.2)

## 2010-09-09 LAB — LIPID PANEL
Cholesterol: 180 mg/dL (ref 0–200)
HDL: 50 mg/dL (ref >39)
Total CHOL/HDL Ratio: 3.6 RATIO
Triglycerides: 90 mg/dL (ref <150)
VLDL: 18 mg/dL (ref 0–40)

## 2010-09-09 LAB — MRSA PCR SCREENING: MRSA by PCR: NEGATIVE

## 2010-09-09 LAB — COMPREHENSIVE METABOLIC PANEL
ALT: 14 U/L (ref 0–53)
Albumin: 4 g/dL (ref 3.5–5.2)
Alkaline Phosphatase: 75 U/L (ref 39–117)
Glucose, Bld: 100 mg/dL — ABNORMAL HIGH (ref 70–99)
Potassium: 3.5 mEq/L (ref 3.5–5.1)
Sodium: 138 mEq/L (ref 135–145)
Total Protein: 7.2 g/dL (ref 6.0–8.3)

## 2010-09-09 LAB — CARDIAC PANEL(CRET KIN+CKTOT+MB+TROPI)
CK, MB: 1.8 ng/mL (ref 0.3–4.0)
Troponin I: <0.3 ng/mL (ref <0.30)

## 2010-09-10 LAB — BASIC METABOLIC PANEL
BUN: 11 mg/dL (ref 6–23)
CO2: 30 mEq/L (ref 19–32)
Calcium: 9.1 mg/dL (ref 8.4–10.5)
Creatinine, Ser: 1.41 mg/dL — ABNORMAL HIGH (ref 0.50–1.35)
Glucose, Bld: 90 mg/dL (ref 70–99)

## 2010-09-10 LAB — CBC
Hemoglobin: 14.7 g/dL (ref 13.0–17.0)
MCH: 32.8 pg (ref 26.0–34.0)
MCV: 93.5 fL (ref 78.0–100.0)
RBC: 4.48 MIL/uL (ref 4.22–5.81)

## 2010-09-11 LAB — BASIC METABOLIC PANEL
BUN: 12 mg/dL (ref 6–23)
Calcium: 8.4 mg/dL (ref 8.4–10.5)
GFR calc non Af Amer: >60 mL/min (ref >60)
Glucose, Bld: 85 mg/dL (ref 70–99)
Potassium: 3.5 mEq/L (ref 3.5–5.1)

## 2010-09-12 LAB — METANEPHRINES, PLASMA: Metanephrine, Free: 30 pg/mL (ref <=57)

## 2010-09-12 NOTE — H&P (Signed)
NAMEHEARL, NARTKER NO.:  [id number]  MEDICAL RECORD NO.:  [id number]  LOCATION:                                 FACILITY:  PHYSICIAN:  Rise Patience, MDDATE OF BIRTH:  1970-08-15  DATE OF ADMISSION: DATE OF DISCHARGE:                             HISTORY & PHYSICAL   PRIMARY CARE PHYSICIAN:  Unassigned.  The patient used to go to [facility], has not been at least for more than 6 months.  CHIEF COMPLAINT:  Shortness of breath.  HISTORY OF PRESENT ILLNESS:  This 40 year old male with previous history of hypertension, noncompliance with his medication presented with complaints of shortness of breath and headache and sweating over the last 2-3 weeks.  The patient said that recently he has been feeling short of breath even he lies down completely flat and also has been experiencing some occipital headaches with sweating spells which became more pronounced today, and he came to the ER.  In the ER, the patient had a CT angio of chest which is negative for any PE or dissection.  The patient has been admitted for further workup.  The patient denies any chest pain, denies any cough or phlegm, denies any fever or chills, denies any abdominal pain, dysuria, discharge, or diarrhea.  The patient does have some pain in the lower extremities. The patient denies any dizziness, loss of conscious, or any focal deficits.  PAST MEDICAL HISTORY: 1. Hypertension. 2. Noncompliance with medication.  PAST SURGICAL HISTORY:  He has had surgery for her both ankles and also had a surgery for hernia.  MEDICATIONS PRIOR TO ADMISSION:  None.  FAMILY HISTORY:  Significant for hypertension.  SOCIAL HISTORY:  The patient smokes cigarettes; drinks alcohol on the weekends, usually beer.  He is positive for marijuana.  Denies any cocaine or IV drug abuse.  FAMILY HISTORY:  Positive for hypertension.  REVIEW OF SYSTEMS:  As per the history of presenting illness,  nothing else significant.  PHYSICAL EXAMINATION:  GENERAL:  The patient was examined at bedside, not in acute distress. VITAL SIGNS:  Blood pressure is 188/120, pulse is 70 per minute, temperature 97.7, respirations 18 per minute, O2 sat is 98%. HEENT:  Anicteric.  No pallor.  No facial asymmetry.  Tongue is midline. CHEST:  Bilateral air entry present.  No rhonchi, no crepitation. HEART:  S1 and S2 heard. ABDOMEN:  Soft, nontender.  Bowel sounds heard. CENTRAL NERVOUS SYSTEM:  The patient is alert, awake, and oriented to time, place, and person, moves upper and lower extremities 5/5. EXTREMITIES:  Peripheral pulses felt.  No edema.  LABORATORY DATA:  I will order an EKG. Chest x-ray shows no acute findings. CT angio of chest shows no evidence of significant pulmonary embolus. CBC:  WBCs 5.1, hemoglobin 15.1, hematocrit is 41, platelets 152.  D- dimer is 0.56.  Basic metabolic panel:  Sodium [id number], potassium 3.2, chloride 103, carbon dioxide 23, glucose 85, BUN 9, creatinine 1.1, calcium 8.9.  CK is 124, CK-MB is 1.8, relative index 1.5, troponin less than 0.3.  Drug screen is positive for marijuana.  UA is negative for nitrites and leukocytes, ketones 15, glucose negative, hyaline casts,  mucus present.  ASSESSMENT: 1. Accelerated hypertension. 2. Shortness of breath and headache, probably from accelerated     hypertension reason.  PLAN: 1. At this time, I will admit the patient to telemetry. 2. For his accelerated hypertension, at this time we will place the     patient on low-dose labetalol and add Norvasc p.r.n., IV labetalol.     The patient states that he has been having some headache and     sweating spells.  At this time, it is concerning for any     pheochromocytoma, I will get a plasma metanephrine levels and the     patient also is mildly hypokalemic, for which I will get a plasma     aldosterone and renin level to make sure he does not have any     primary  hyperaldosteronism.  I will cycle cardiac markers, get an     EKG, also get a 2-D echo as the patient is complaining of some     shortness of breath when he lies down.  At this time, all the     symptoms may be related to his high blood pressure. 3. The patient is complaining of headache.  I am going to get a CT of     the head without contrast and further recommendation based on the     test order and clinical course.     Rise Patience, MD     ANK/MEDQ  D:  09/09/2010  T:  09/09/2010  Job:  [id number]  Electronically Signed by Gean Birchwood MD on 09/12/2010 12:25:29 AM

## 2010-09-15 IMAGING — CR DG CHEST 2V
2 series · 2 of 2 positions shown · non-contrast
Comparison: 10/28/2006

CLINICAL DATA: Preadmission for OR.  Cough and shortness of breath.

CHEST - 2 VIEW

[view not recorded (1 of 2)]
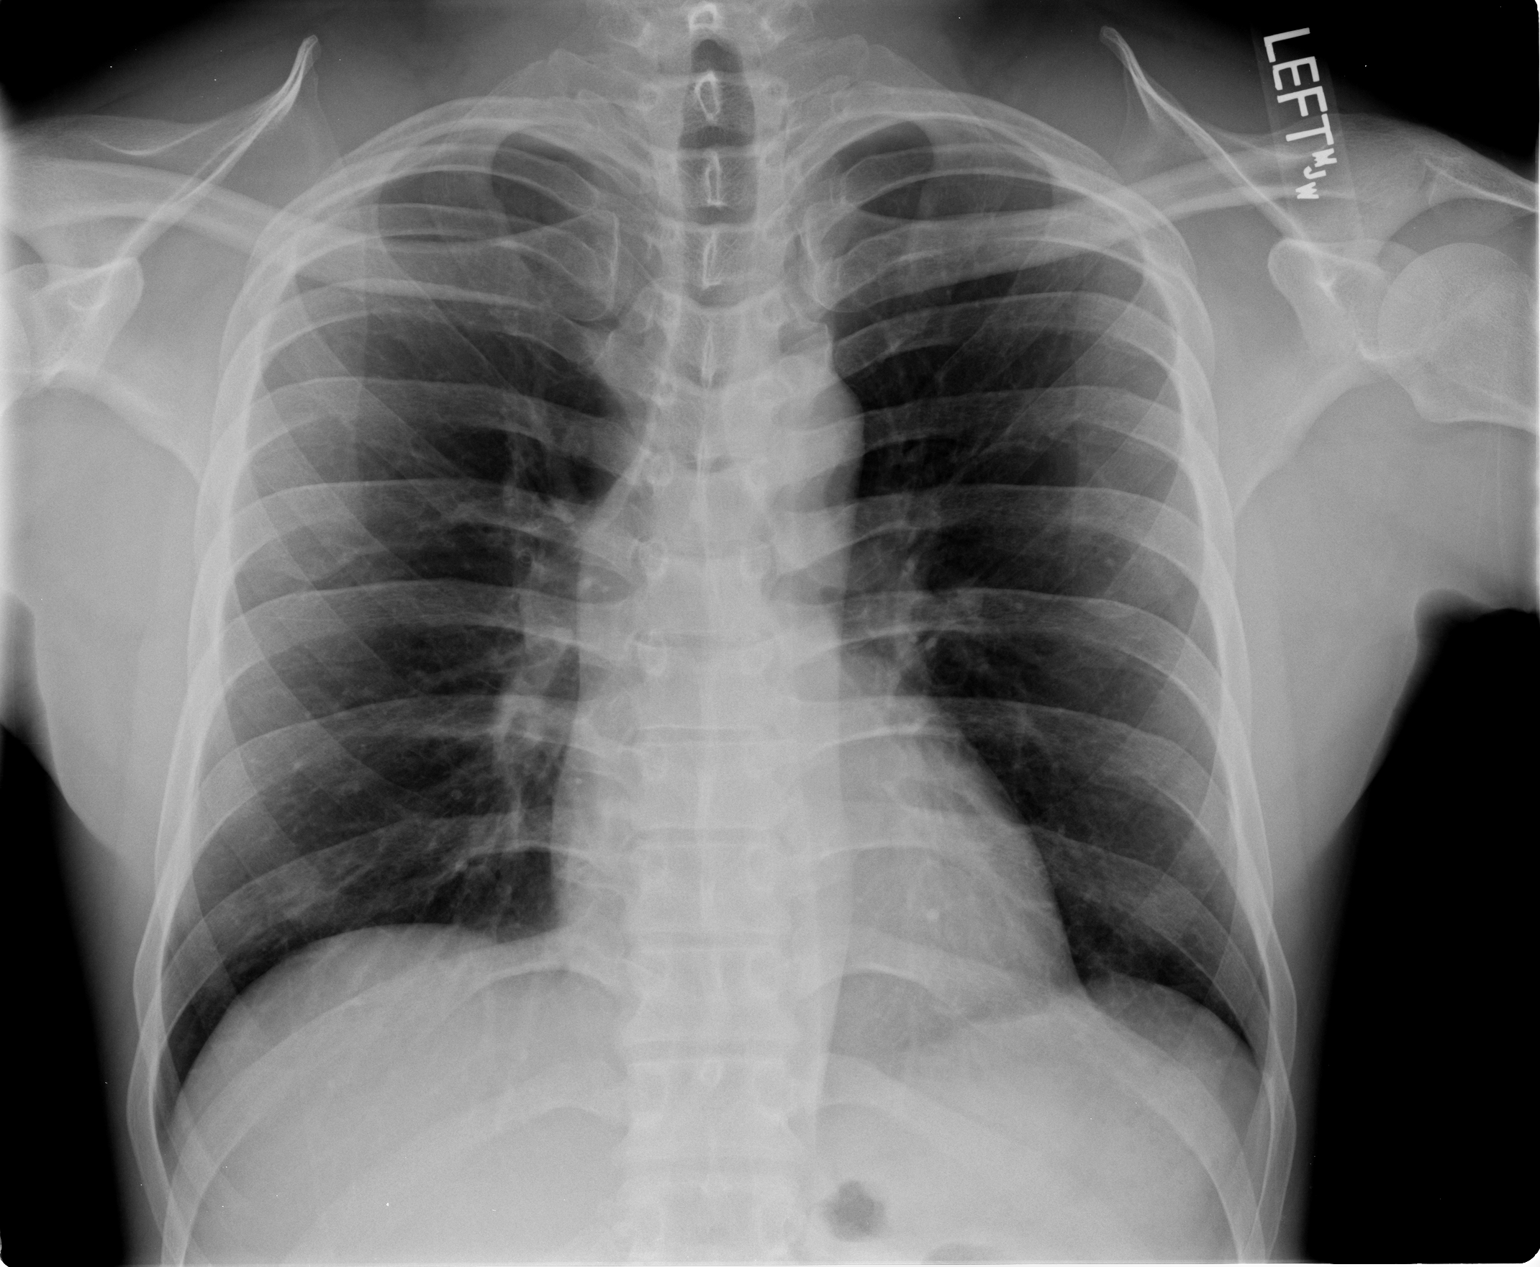

[view not recorded (2 of 2)]
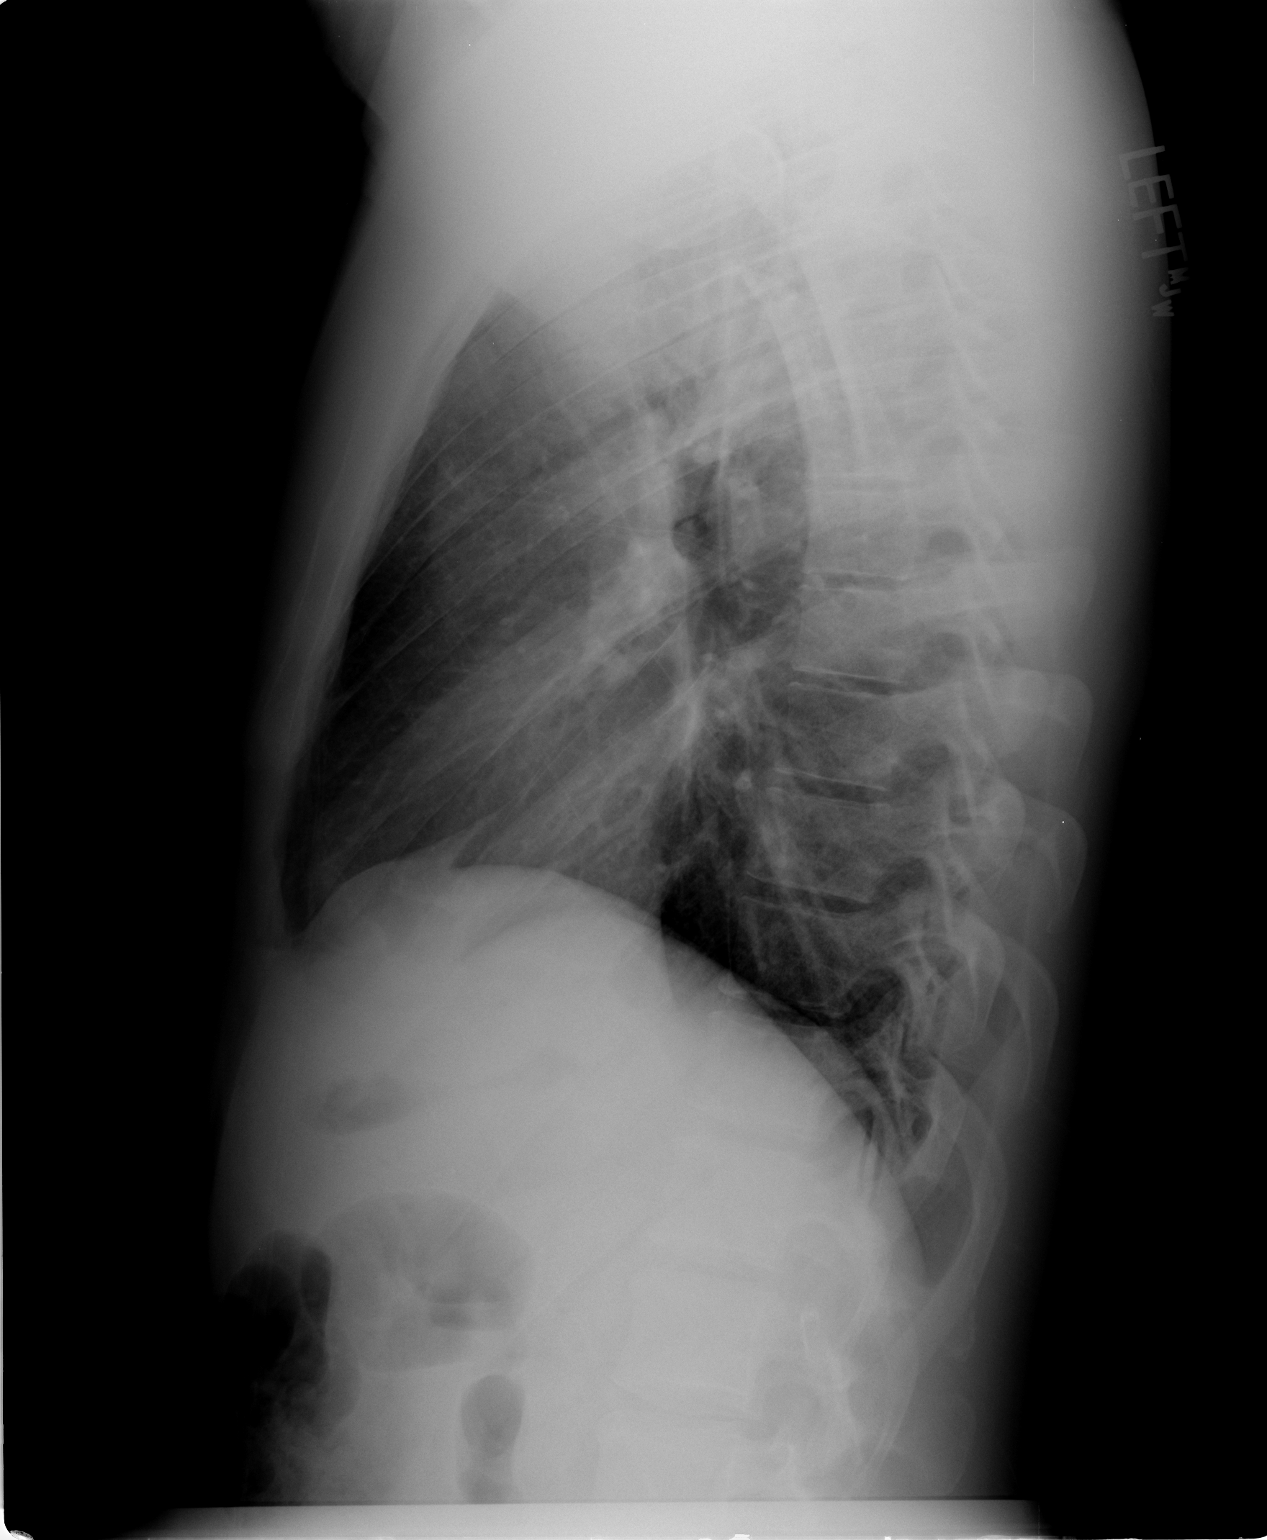

[2 of 2 positions shown; findings below may reference images not displayed]

FINDINGS: Trachea is midline.  Heart size normal.  Lungs are clear.
No pleural fluid.
IMPRESSION: No acute findings.

## 2010-09-24 IMAGING — RF DG OS CALCIS 2+V*L*
1 series · 4 of 4 positions shown · non-contrast
Comparison: 06/28/2007

CLINICAL DATA: Nonunion calcaneal fracture.

LEFT OS CALCIS - 2+ VIEW

[Series 1: run · 4 of 4 slices shown]
[im 1/4]
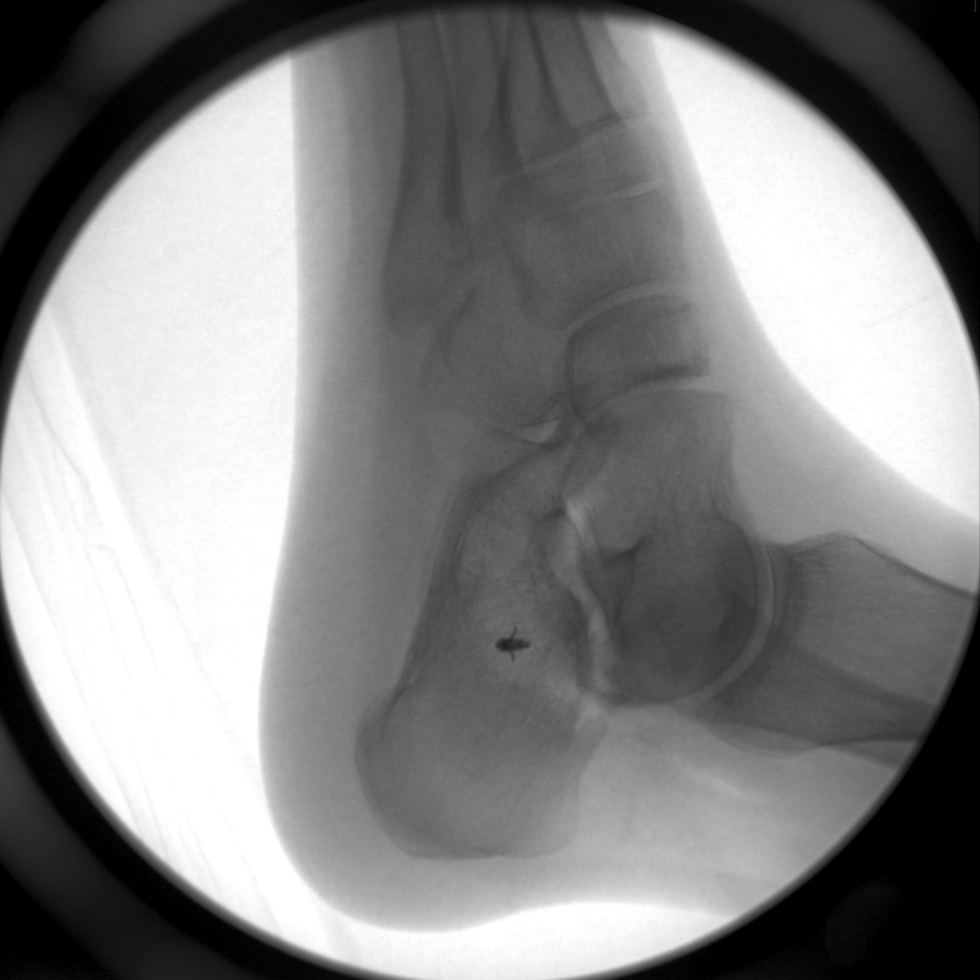
[im 2/4]
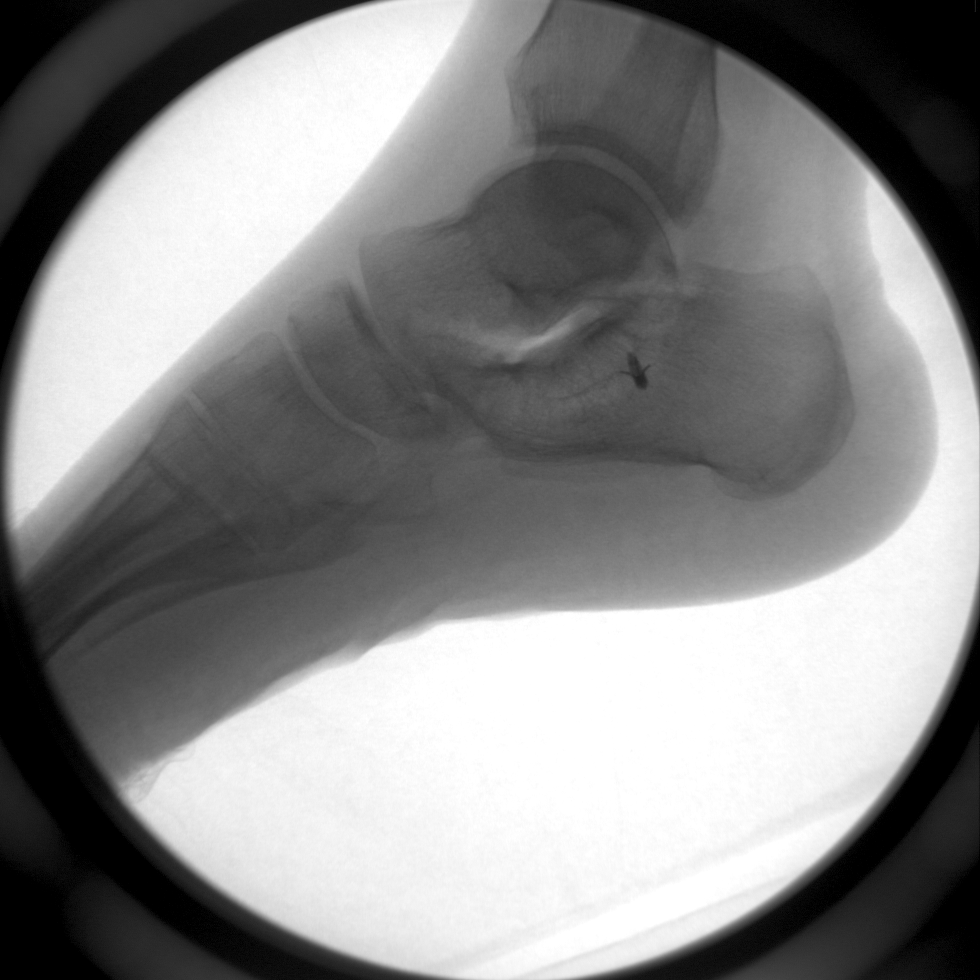
[im 3/4]
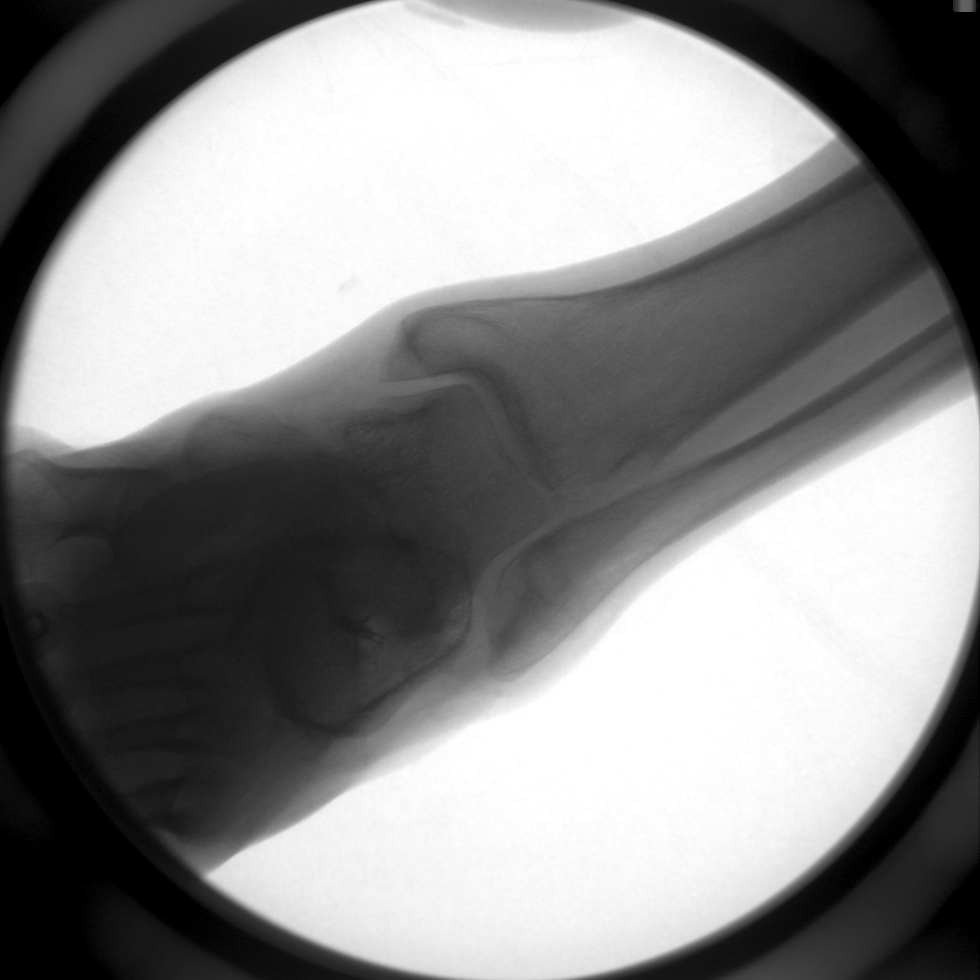
[im 4/4]
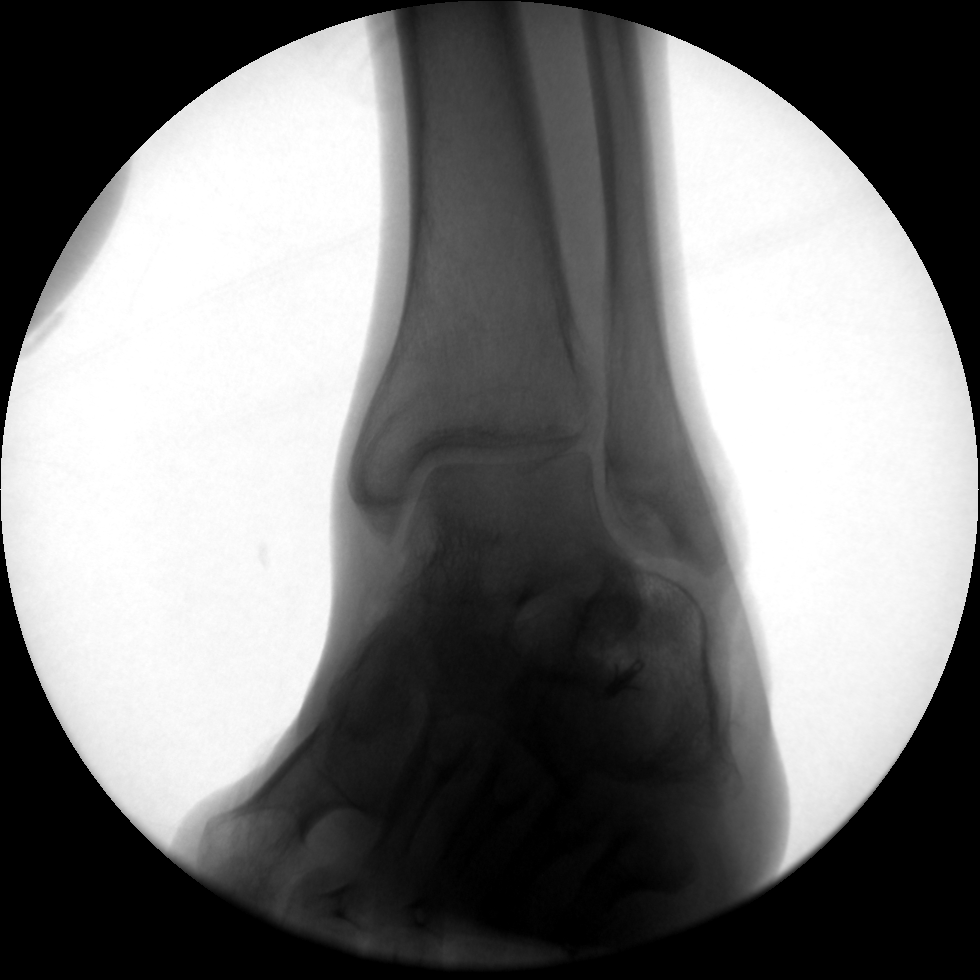

[4 of 4 positions shown; findings below may reference images not displayed]

FINDINGS: There is a single bone anchor noted in the mid calcaneal
region.
IMPRESSION: Single bone anchor noted in the mid calcaneal region.

## 2010-09-24 IMAGING — CR DG OS CALCIS 2+V*L*
2 series · 2 of 2 positions shown · non-contrast
Comparison: 10/22/2006, 06/28/2007 and 01/27/2008.

CLINICAL DATA: Left calcaneus nonunion.

LEFT OS CALCIS - 2+ VIEW

[view not recorded (1 of 2)]
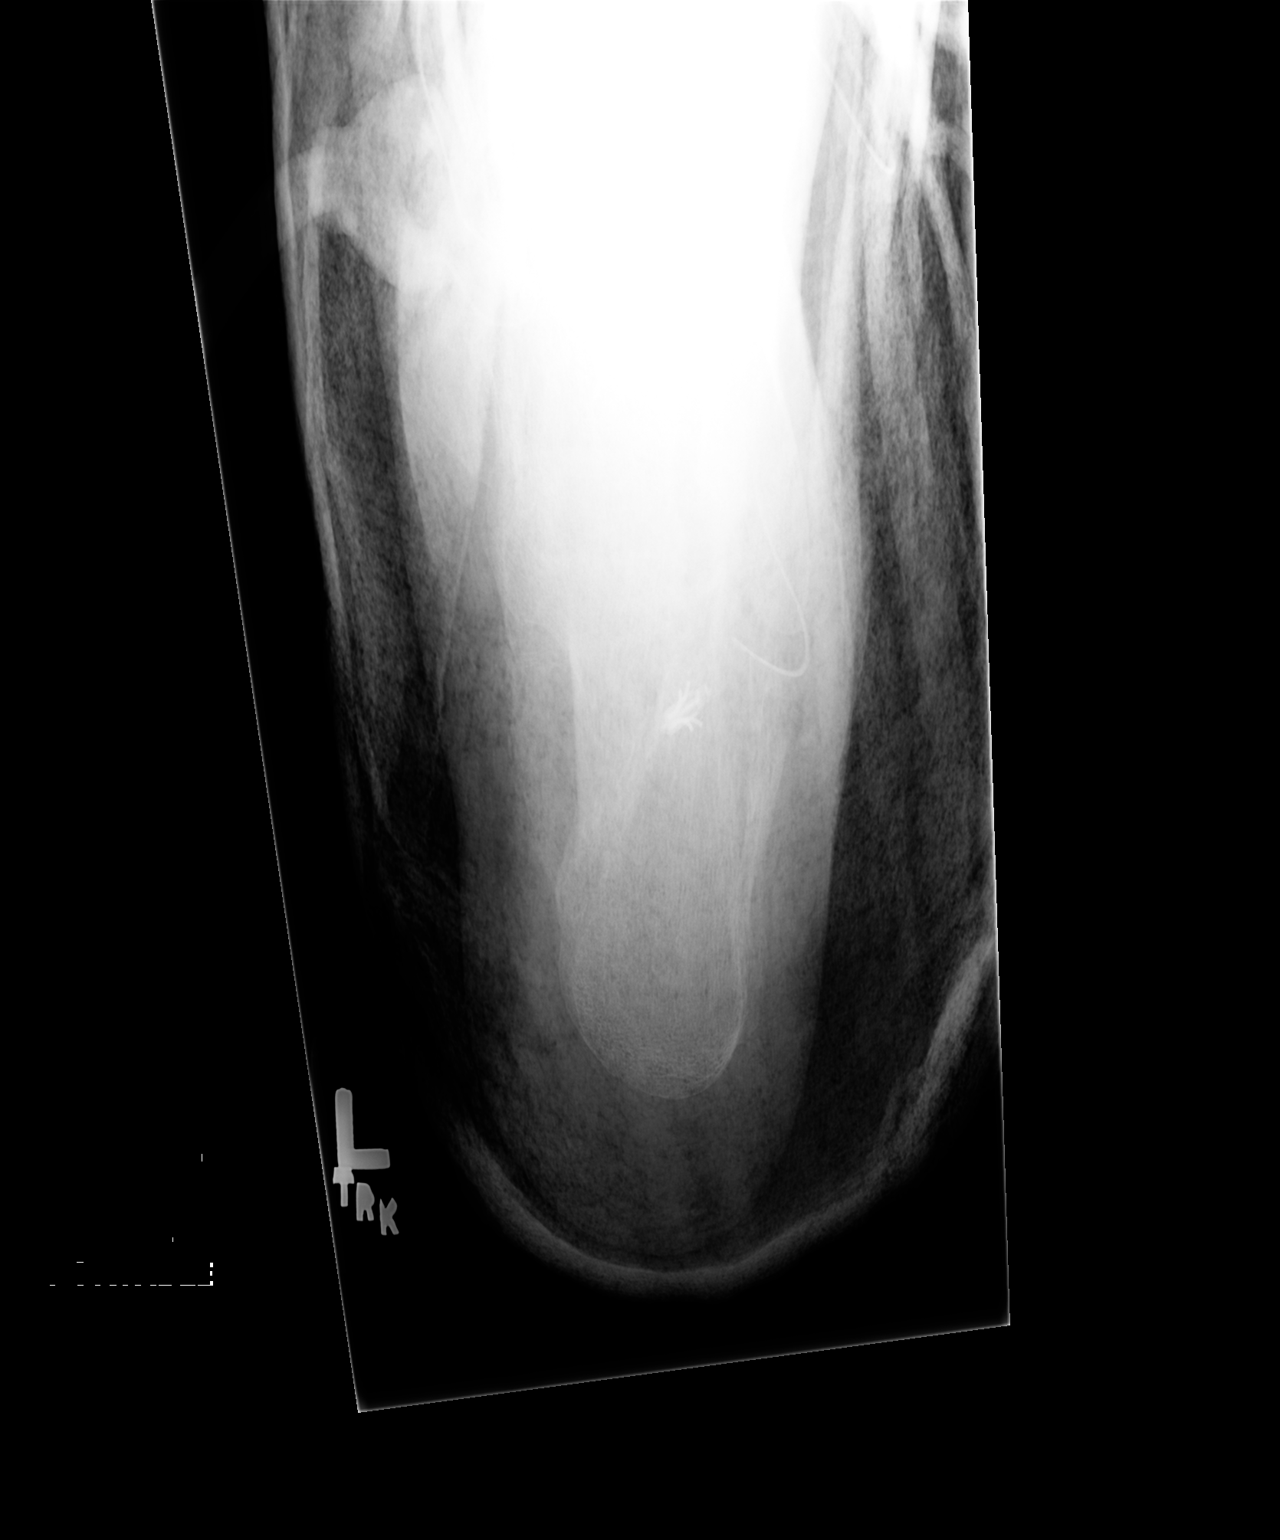

[view not recorded (2 of 2)]
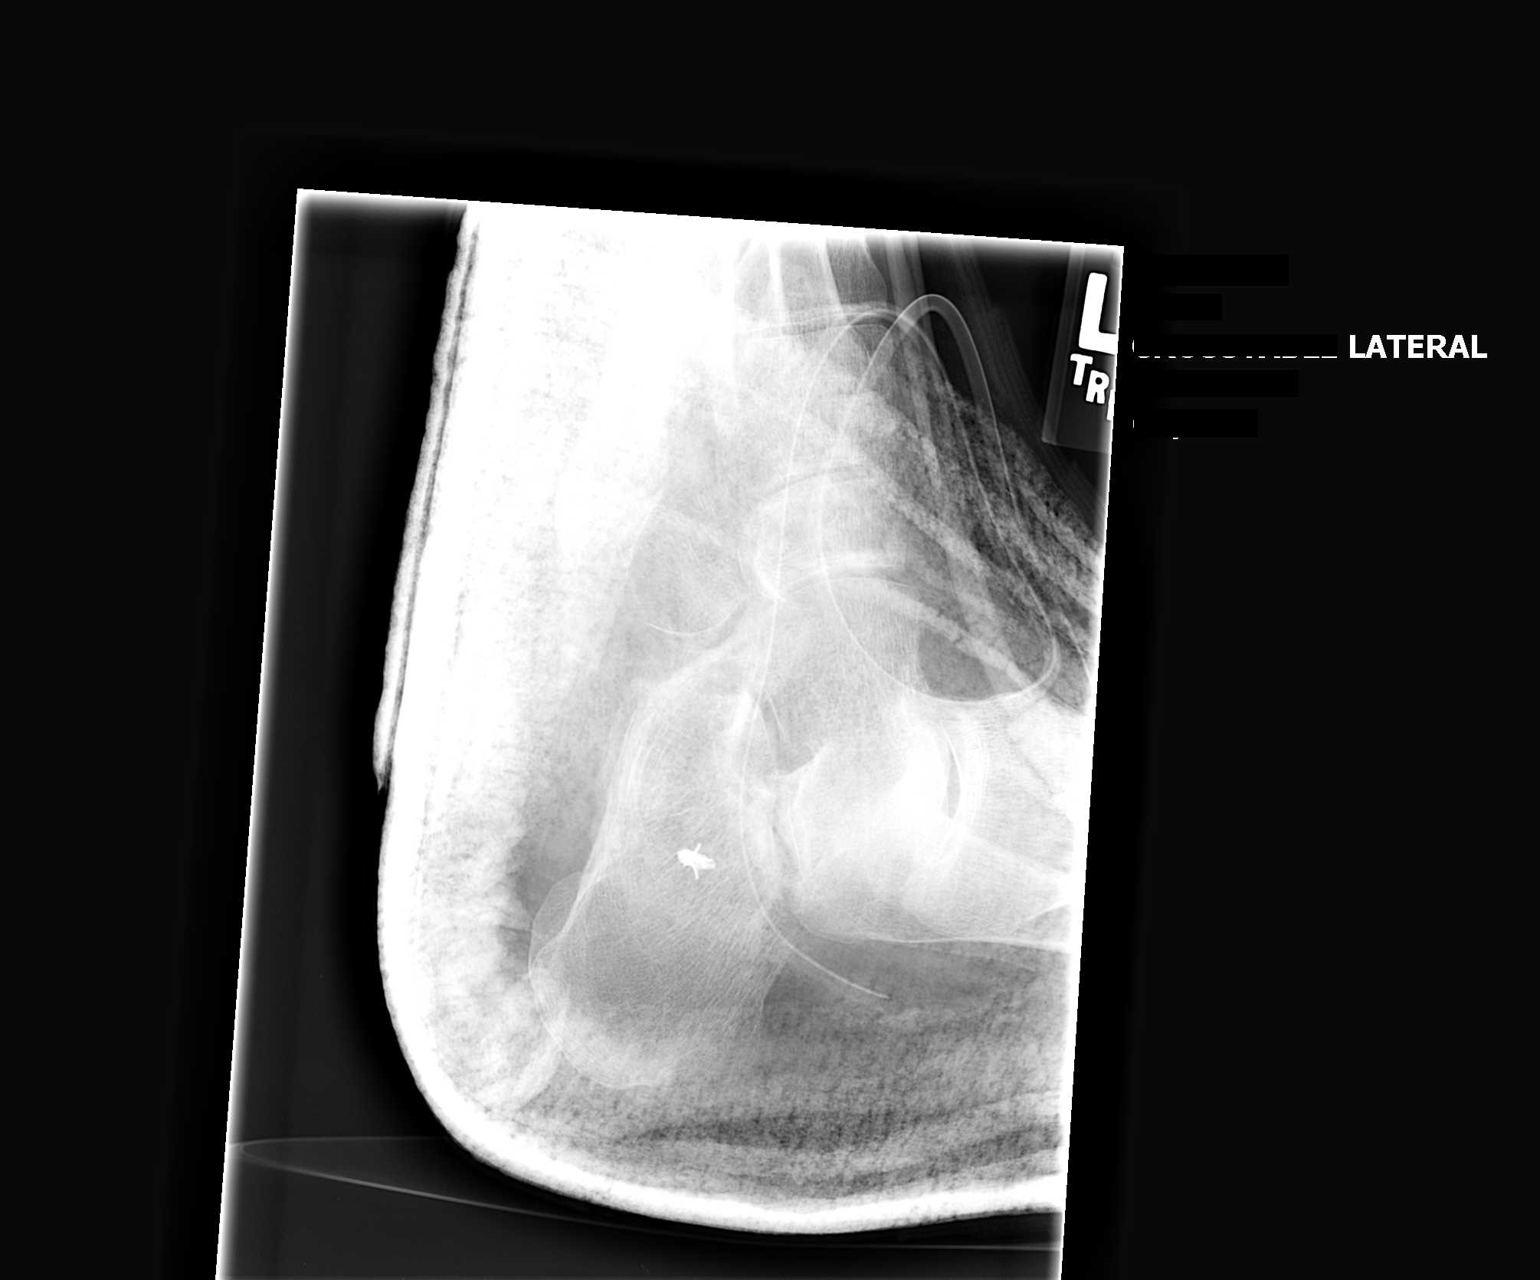

[2 of 2 positions shown; findings below may reference images not displayed]

FINDINGS: Splint obscures fine osseous detail.  A surgical stay is
in place in the central aspect of the left calcaneus.  Slight
irregularity of the anterior process.  Minimal irregularity
subtalar articulation.  Surgical drain is in place.
IMPRESSION: Status post surgery with slight irregularity of portions of the
calcaneus.  These areas may be related to prior fracture and
subsequent degenerative changes.

## 2010-09-24 NOTE — Discharge Summary (Signed)
NAMEZACHRY, [name] NO.:  [id number]  MEDICAL RECORD NO.:  [id number]  LOCATION:  2504                         FACILITY:  [facility]  PHYSICIAN:  Murray Hodgkins, MD    DATE OF BIRTH:  1970/03/23  DATE OF ADMISSION:  09/08/2010 DATE OF DISCHARGE:  09/11/2010                              DISCHARGE SUMMARY   PRIMARY CARE PHYSICIAN:  Barbette Merino, MD  CONDITION ON DISCHARGE:  Improved.  DISPOSITION:  Home.  DISCHARGE DIAGNOSES: 1. Uncontrolled hypertension/hypertensive urgency, resolved. 2. Hypertension, noncompliant with outpatient medications. 3. 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 4. Acute renal failure possibly related to Toradol, resolved.  HISTORY OF PRESENT ILLNESS:  This a 40 year old male who presented to the emergency room with shortness of breath.  He denied any chest pain but he was found to be markedly hypertensive and was admitted for further evaluation and treatment.  HOSPITAL COURSE: 1. Uncontrolled hypertension/hypertensive urgency.  The patient was     admitted, blood pressure was treated aggressively with improved     control.  He will be discharged home on 4 agents.  He has been     noncompliant over the last 10 months with medications.  A CT     echocardiogram was reassuring.  He is committed to taking his     medication at this time. 2. Headache.  He does take chronic Goody powders daily.  I have     discouraged this.  He will be given a prescription for Percocet,     hopefully bridge him off NSAIDs.  CT of the head was negative. 3. Acute renal failure.  Probably related to Toradol, this has     resolved.  CONSULTATIONS:  None.  PROCEDURES:  None.  IMAGING: 1. Chest x-ray June 25.  No acute findings. 2. CT angiogram of the chest June 25.  No evidence for significant     pulmonary embolus. 3. CT of the head June 26:  No evidence of acute intracranial     hemorrhage/lesion or  acute infarct.  MICROBIOLOGY:  None.  Echocardiogram:  Left ventricular ejection fraction 55-60%, moderate aortic valve regurgitation, left ventricle showed moderate hypertrophy.  PERTINENT LABORATORY STUDIES: 1. Urine drug screen was unremarkable. 2. Serum cholesterol panel was unremarkable. 3. Lipase was within normal limits. 4. TSH within normal limits. 5. Serum aldosterone within normal limits. 6. CBC and basic metabolic panel unremarkable on discharge. 7. Cardiac markers were negative.  PHYSICAL EXAMINATION:  GENERAL:  Exam at discharge, the patient is feeling well.  No complaints. VITAL SIGNS:  Overall, blood pressure is better controlled.  Temperature is 98.1, pulse 63, respirations 13, blood pressure 132/90, sat 98% on room air. CARDIOVASCULAR:  Regular rate and rhythm.  No murmur, rub or gallop. RESPIRATORY:  Clear to auscultation bilaterally.  No wheezes, rales or rhonchi.  Normal respiratory effort.  DISCHARGE INSTRUCTIONS:  The patient will be discharged home today. Diet is a heart-healthy diet.  Activity as tolerated.  Follow up with Dr. Barbette Merino as scheduled July 2 at 2:00 p.m.  DISCHARGE MEDICATIONS: 1. Amlodipine 10 mg p.o. daily. 2. Clonidine 0.1 mg  p.o. b.i.d. 3. Hydrochlorothiazide 25 mg p.o. daily. 4. Labetalol 100 mg p.o. b.i.d. 5. Percocet 10/325 one tablet every 6 hours as needed for pain #20, no     refill.  TIME COORDINATING DISCHARGE:  Thirty five minutes.     Murray Hodgkins, MD     DG/MEDQ  D:  09/11/2010  T:  09/12/2010  Job:  [id number]  cc:   Barbette Merino, M.D.  Electronically Signed by Murray Hodgkins  on 09/24/2010 06:36:34 PM

## 2010-12-11 LAB — CBC
HCT: 44.4
Hemoglobin: 15.6
MCV: 97.6
Platelets: 155
RBC: 4.55
WBC: 6.2

## 2010-12-11 LAB — POCT I-STAT, CHEM 8
BUN: 12
Calcium, Ion: 1.18
Creatinine, Ser: 1.1
Glucose, Bld: 100 — ABNORMAL HIGH
Hemoglobin: 16
TCO2: 25

## 2010-12-11 LAB — DIFFERENTIAL
Eosinophils Absolute: 0.2
Eosinophils Relative: 4
Lymphocytes Relative: 38
Lymphs Abs: 2.3
Monocytes Absolute: 0.6
Monocytes Relative: 10

## 2010-12-17 LAB — HEPATIC FUNCTION PANEL
ALT: 25
ALT: 28
AST: 19
AST: 26
Albumin: 3.5
Albumin: 3.8
Alkaline Phosphatase: 66
Alkaline Phosphatase: 72
Bilirubin, Direct: 0.1
Bilirubin, Direct: <0.1
Indirect Bilirubin: 0.4
Total Bilirubin: 0.5
Total Bilirubin: 0.7
Total Protein: 6.3
Total Protein: 6.9

## 2010-12-17 LAB — CBC
HCT: 42.1
Hemoglobin: 13.4
Hemoglobin: 14.5
Hemoglobin: 15.5
MCHC: 33.5
MCHC: 33.8
MCHC: 34.5
MCV: 96.5
Platelets: 149 — ABNORMAL LOW
Platelets: 164
Platelets: 175
RBC: 4.37
RDW: 13
RDW: 13.4
RDW: 13.4
WBC: 5.5

## 2010-12-17 LAB — BASIC METABOLIC PANEL
BUN: 7
CO2: 24
Calcium: 9.2
Creatinine, Ser: 1.05
GFR calc non Af Amer: >60
Glucose, Bld: 126 — ABNORMAL HIGH

## 2010-12-17 LAB — BASIC METABOLIC PANEL WITH GFR
BUN: 11
BUN: 8
CO2: 28
CO2: 28
Calcium: 9.1
Calcium: 9.4
Chloride: 103
Chloride: 107
Creatinine, Ser: 1.08
Creatinine, Ser: 1.29
GFR calc non Af Amer: >60
GFR calc non Af Amer: >60
Glucose, Bld: 88
Glucose, Bld: 95
Potassium: 4
Potassium: 4.2
Sodium: 138
Sodium: 139

## 2010-12-24 LAB — HEMOGLOBIN AND HEMATOCRIT, BLOOD
HCT: 40
Hemoglobin: 13.8

## 2010-12-29 LAB — PROTIME-INR: INR: 1

## 2010-12-29 LAB — COMPREHENSIVE METABOLIC PANEL
AST: 26
Albumin: 3.9
Alkaline Phosphatase: 93
BUN: 6
GFR calc Af Amer: >60
Potassium: 4.2
Total Protein: 7.3

## 2010-12-29 LAB — CBC
HCT: 46.8
Platelets: 206
RDW: 12.9

## 2010-12-29 LAB — APTT: aPTT: 32

## 2011-01-05 ENCOUNTER — Emergency Department (HOSPITAL_COMMUNITY)
Admission: EM | Admit: 2011-01-05 | Discharge: 2011-01-05 | Disposition: A | Discharge status: Home / Self Care | Payer: Medicare Other | Attending: Emergency Medicine | Admitting: Emergency Medicine

## 2011-01-05 ENCOUNTER — Emergency Department (HOSPITAL_COMMUNITY): Payer: Medicare Other

## 2011-01-05 DIAGNOSIS — M79609 Pain in unspecified limb: Secondary | ICD-10-CM | POA: Insufficient documentation

## 2011-01-05 DIAGNOSIS — M25579 Pain in unspecified ankle and joints of unspecified foot: Secondary | ICD-10-CM | POA: Insufficient documentation

## 2011-01-05 DIAGNOSIS — I10 Essential (primary) hypertension: Secondary | ICD-10-CM | POA: Insufficient documentation

## 2011-01-05 DIAGNOSIS — H60399 Other infective otitis externa, unspecified ear: Secondary | ICD-10-CM | POA: Insufficient documentation

## 2011-01-19 ENCOUNTER — Emergency Department (HOSPITAL_COMMUNITY)
Admission: EM | Admit: 2011-01-19 | Discharge: 2011-01-19 | Discharge status: Against Medical Advice | Payer: Medicare Other | Attending: Emergency Medicine | Admitting: Emergency Medicine

## 2011-01-19 ENCOUNTER — Encounter: Payer: Self-pay | Admitting: Emergency Medicine

## 2011-01-19 DIAGNOSIS — M79609 Pain in unspecified limb: Secondary | ICD-10-CM | POA: Insufficient documentation

## 2011-01-19 HISTORY — DX: Essential (primary) hypertension: I10

## 2011-01-19 NOTE — ED Notes (Signed)
Pt c/o knot in groin area starting 5 days ago; pt sts painful; pt sts sorethroat; pt sts hx of hernia on right side also

## 2011-03-31 ENCOUNTER — Other Ambulatory Visit: Payer: Self-pay

## 2011-03-31 ENCOUNTER — Encounter (HOSPITAL_COMMUNITY): Payer: Self-pay | Admitting: *Deleted

## 2011-03-31 ENCOUNTER — Emergency Department (HOSPITAL_COMMUNITY)
Admission: EM | Admit: 2011-03-31 | Discharge: 2011-03-31 | Disposition: A | Discharge status: Home / Self Care | Payer: Medicare Other | Attending: Emergency Medicine | Admitting: Emergency Medicine

## 2011-03-31 ENCOUNTER — Emergency Department (HOSPITAL_COMMUNITY): Payer: Medicare Other

## 2011-03-31 DIAGNOSIS — R05 Cough: Secondary | ICD-10-CM | POA: Insufficient documentation

## 2011-03-31 DIAGNOSIS — J40 Bronchitis, not specified as acute or chronic: Secondary | ICD-10-CM | POA: Diagnosis not present

## 2011-03-31 DIAGNOSIS — R509 Fever, unspecified: Secondary | ICD-10-CM | POA: Insufficient documentation

## 2011-03-31 DIAGNOSIS — R0602 Shortness of breath: Secondary | ICD-10-CM | POA: Diagnosis not present

## 2011-03-31 DIAGNOSIS — J209 Acute bronchitis, unspecified: Secondary | ICD-10-CM | POA: Diagnosis not present

## 2011-03-31 DIAGNOSIS — R0989 Other specified symptoms and signs involving the circulatory and respiratory systems: Secondary | ICD-10-CM | POA: Diagnosis not present

## 2011-03-31 DIAGNOSIS — R059 Cough, unspecified: Secondary | ICD-10-CM | POA: Diagnosis not present

## 2011-03-31 DIAGNOSIS — R079 Chest pain, unspecified: Secondary | ICD-10-CM | POA: Diagnosis not present

## 2011-03-31 DIAGNOSIS — R61 Generalized hyperhidrosis: Secondary | ICD-10-CM | POA: Insufficient documentation

## 2011-03-31 DIAGNOSIS — I517 Cardiomegaly: Secondary | ICD-10-CM | POA: Diagnosis not present

## 2011-03-31 DIAGNOSIS — I10 Essential (primary) hypertension: Secondary | ICD-10-CM

## 2011-03-31 MED ORDER — IBUPROFEN 200 MG PO TABS
600.0000 mg | ORAL_TABLET | Freq: Once | ORAL | Status: AC
Start: 2011-03-31 — End: 2011-03-31
  Administered 2011-03-31: 600 mg via ORAL
  Filled 2011-03-31: qty 3

## 2011-03-31 MED ORDER — BENZONATATE 100 MG PO CAPS: 100.0000 mg | ORAL_CAPSULE | Freq: Three times a day (TID) | ORAL | Status: AC

## 2011-03-31 MED ORDER — LISINOPRIL 20 MG PO TABS: 10.0000 mg | ORAL_TABLET | Freq: Every day | ORAL | Status: DC

## 2011-03-31 MED ORDER — LISINOPRIL 10 MG PO TABS
10.0000 mg | ORAL_TABLET | ORAL | Status: AC
  Administered 2011-03-31: 10 mg via ORAL
  Filled 2011-03-31: qty 1

## 2011-03-31 MED ORDER — GI COCKTAIL ~~LOC~~
30.0000 mL | Freq: Once | ORAL | Status: AC
  Administered 2011-03-31: 30 mL via ORAL
  Filled 2011-03-31: qty 30

## 2011-03-31 MED ORDER — OXYCODONE-ACETAMINOPHEN 5-325 MG PO TABS
1.0000 | ORAL_TABLET | Freq: Once | ORAL | Status: AC
  Administered 2011-03-31: 1 via ORAL
  Filled 2011-03-31: qty 1

## 2011-03-31 NOTE — ED Notes (Signed)
Pt is here with tightness in chest for last week and states hard to breath, especially at nite.

## 2011-03-31 NOTE — ED Notes (Signed)
Pt. oob to bathroom gait steady, ate a sandwich and had po fluids.  Pt. Felt better, continued to have a headache. But denied any abdominal pain or chest pain

## 2011-03-31 NOTE — ED Notes (Signed)
Reported to Dr. Vanessa Kick, pt.s elevated BP, orders received and medication ordered from pharmacy

## 2011-03-31 NOTE — ED Notes (Signed)
Pt sts that he has been having abd pain and blood in stool.  Bp elevated at triage adn pt has been out of his med

## 2011-03-31 NOTE — ED Provider Notes (Signed)
History     CSN: [id number]  Arrival date & time 03/31/11  0909   First MD Initiated Contact with Patient 03/31/11 [phone]      Chief Complaint  Patient presents with  . Shortness of Breath    (Consider location/radiation/quality/duration/timing/severity/associated sxs/prior treatment) Patient is a 41 y.o. male presenting with shortness of breath. The history is provided by the patient.  Shortness of Breath  Associated symptoms include chest pain, a fever, cough and shortness of breath.   the patient is a 41 year old male, who smokes cigarettes and has no significant past medical history.  He presents to emergency department complaining of chest pain, cough, with yellow sputum, shortness of breath, and intermittent fevers, chills, and diaphoresis.  For the past several days.  He denies nausea or vomiting.  He has not had leg pain or swelling.  He returned from [place] 3 days ago.  However, he was ambulatory, and remains ambulatory.  He is never had a pulmonary embolism in the past.  Past Medical History  Diagnosis Date  . Hypertension     Past Surgical History  Procedure Date  . Hernia repair     No family history on file.  History  Substance Use Topics  . Smoking status: Current Some Day Smoker  . Smokeless tobacco: Not on file  . Alcohol Use: Yes     occ      Review of Systems  Constitutional: Positive for fever and chills.  HENT: Negative for congestion.   Respiratory: Positive for cough and shortness of breath.   Cardiovascular: Positive for chest pain.  Gastrointestinal: Negative for nausea and vomiting.  All other systems reviewed and are negative.    Allergies  Review of patient's allergies indicates no known allergies.  Home Medications   Current Outpatient Rx  Name Route Sig Dispense Refill  . GOODYS EXTRA STRENGTH PO Oral Take 1 packet by mouth 2 (two) times daily as needed. For headache      BP 190/140  Pulse 102  Temp(Src) 98.6 F (37 C)  (Oral)  Resp 20  SpO2 100%  Physical Exam  Vitals reviewed. Constitutional: He is oriented to person, place, and time. He appears well-developed and well-nourished. No distress.  HENT:  Head: Normocephalic and atraumatic.  Eyes: EOM are normal. Pupils are equal, round, and reactive to light.  Neck: Normal range of motion. Neck supple.  Cardiovascular: Normal rate, regular rhythm and normal heart sounds.   No murmur heard. Pulmonary/Chest: Effort normal and breath sounds normal. No respiratory distress. He has no wheezes. He has no rales.  Abdominal: Soft. Bowel sounds are normal. He exhibits no distension and no mass. There is no tenderness. There is no rebound and no guarding.  Musculoskeletal: Normal range of motion. He exhibits no edema and no tenderness.  Neurological: He is alert and oriented to person, place, and time. No cranial nerve deficit.  Skin: Skin is warm and dry. He is not diaphoretic.  Psychiatric: He has a normal mood and affect. His behavior is normal.    ED Course  Procedures (including critical care time) 41 year old male, with no significant past medical history presents emergency Department with symptoms consistent of a viral illness or pneumonia.  He got mild tachycardia.  With no risk factors for pulmonary embolism.  We will perform a chest x-ray, and a d-dimer, and treat him symptomatically until his test, results are available.   Labs Reviewed  D-DIMER, QUANTITATIVE   No results found.   ED ECG  REPORT   Date: 03/31/2011  EKG Time: 10:17 AM  Rate: 101  Rhythm: sinus tachycardia,   Axis: nl  Intervals:none  ST&T Change: nonspecific  Narrative Interpretation: Sinus tachycardia with biatrial enlargement and nonspecific T-wave changes.             MDM  Bronchitis No pneumonia.  No hypoxia or respiratory distress.  Hypertension, with no suggestion of endorgan damage.        Elmer Picker, MD 03/31/11 1259

## 2011-05-04 ENCOUNTER — Encounter (HOSPITAL_COMMUNITY): Payer: Self-pay | Admitting: Emergency Medicine

## 2011-05-04 ENCOUNTER — Emergency Department (HOSPITAL_COMMUNITY): Payer: Medicare Other

## 2011-05-04 ENCOUNTER — Emergency Department (HOSPITAL_COMMUNITY)
Admission: EM | Admit: 2011-05-04 | Discharge: 2011-05-04 | Disposition: A | Discharge status: Home / Self Care | Payer: Medicare Other | Attending: Emergency Medicine | Admitting: Emergency Medicine

## 2011-05-04 DIAGNOSIS — R0989 Other specified symptoms and signs involving the circulatory and respiratory systems: Secondary | ICD-10-CM | POA: Diagnosis not present

## 2011-05-04 DIAGNOSIS — R05 Cough: Secondary | ICD-10-CM | POA: Diagnosis not present

## 2011-05-04 DIAGNOSIS — R634 Abnormal weight loss: Secondary | ICD-10-CM | POA: Diagnosis not present

## 2011-05-04 DIAGNOSIS — I10 Essential (primary) hypertension: Secondary | ICD-10-CM | POA: Insufficient documentation

## 2011-05-04 DIAGNOSIS — R0602 Shortness of breath: Secondary | ICD-10-CM | POA: Diagnosis not present

## 2011-05-04 DIAGNOSIS — J209 Acute bronchitis, unspecified: Secondary | ICD-10-CM | POA: Diagnosis not present

## 2011-05-04 DIAGNOSIS — F17200 Nicotine dependence, unspecified, uncomplicated: Secondary | ICD-10-CM | POA: Diagnosis not present

## 2011-05-04 DIAGNOSIS — R0609 Other forms of dyspnea: Secondary | ICD-10-CM | POA: Diagnosis not present

## 2011-05-04 DIAGNOSIS — R059 Cough, unspecified: Secondary | ICD-10-CM | POA: Insufficient documentation

## 2011-05-04 DIAGNOSIS — J40 Bronchitis, not specified as acute or chronic: Secondary | ICD-10-CM | POA: Diagnosis not present

## 2011-05-04 MED ORDER — ALBUTEROL SULFATE HFA 108 (90 BASE) MCG/ACT IN AERS: 1.0000 | INHALATION_SPRAY | Freq: Four times a day (QID) | RESPIRATORY_TRACT | Status: DC | PRN

## 2011-05-04 NOTE — ED Notes (Signed)
Pt c/o SOB and difficulty laying flat x 2 weeks; pt sts was seen here for same in past; pt sts productive cough with yellow sputum

## 2011-05-04 NOTE — ED Provider Notes (Signed)
History     CSN: [id number]  Arrival date & time 05/04/11  1322   First MD Initiated Contact with [name] 05/04/11 1648      Chief Complaint  [name] presents with  . Shortness of Breath  . Cough    (Consider location/radiation/quality/duration/timing/severity/associated sxs/prior treatment) HPI Comments: [name] presents with cough and shortness of breath for the last several months. It happens mostly at night when he lies flat he feels like he gets wheezy and has coughing spells he feels better when he sits up. This is not seen at the meds during the day. He does have intermittent coughing with yellow sputum. Denies any chest pain. Denies any leg pain or swelling. Denies any fevers. He does note a recent weight loss of 10-20 pounds over the last several months. He is also noted to have high blood pressure but denies any symptoms related to this. He was recently started on antihypertensive by the emergency department. He does have an initial primary care provider appointment on March 1 with one of the outpatient clinics at [facility].  [name] is a 41 y.o. male presenting with shortness of breath and cough. The history is provided by the [name].  Shortness of Breath  Associated symptoms include cough and shortness of breath. Pertinent negatives include no chest pain, no fever and no rhinorrhea.  Cough Associated symptoms include shortness of breath. Pertinent negatives include no chest pain, no chills, no headaches and no rhinorrhea.    Past Medical History  Diagnosis Date  . Hypertension     Past Surgical History  Procedure Date  . Hernia repair     History reviewed. No pertinent family history.  History  Substance Use Topics  . Smoking status: Current Some Day Smoker  . Smokeless tobacco: Not on file  . Alcohol Use: Yes     occ      Review of Systems  Constitutional: Negative for fever, chills, diaphoresis and fatigue.  HENT: Negative for congestion, rhinorrhea  and sneezing.   Eyes: Negative.   Respiratory: Positive for cough and shortness of breath. Negative for chest tightness.   Cardiovascular: Negative for chest pain and leg swelling.  Gastrointestinal: Negative for nausea, vomiting, abdominal pain, diarrhea and blood in stool.  Genitourinary: Negative for frequency, hematuria, flank pain and difficulty urinating.  Musculoskeletal: Negative for back pain and arthralgias.  Skin: Negative for rash.  Neurological: Negative for dizziness, speech difficulty, weakness, numbness and headaches.    Allergies  Review of [name]'s allergies indicates no known allergies.  Home Medications   Current Outpatient Rx  Name Route Sig Dispense Refill  . GOODYS EXTRA STRENGTH PO Oral Take 1 packet by mouth 2 (two) times daily as needed. For headache    . LISINOPRIL 20 MG PO TABS Oral Take 0.5 tablets (10 mg total) by mouth daily. 30 tablet 6  . ALBUTEROL SULFATE HFA 108 (90 BASE) MCG/ACT IN AERS Inhalation Inhale 1-2 puffs into the lungs every 6 (six) hours as needed for wheezing. 1 Inhaler 0    BP 208/144  Pulse 92  Temp(Src) 98.5 F (36.9 C) (Oral)  Resp 20  SpO2 99%  Physical Exam  Constitutional: He is oriented to person, place, and time. He appears well-developed and well-nourished.  HENT:  Head: Normocephalic and atraumatic.  Eyes: Pupils are equal, round, and reactive to light.  Neck: Normal range of motion. Neck supple.  Cardiovascular: Normal rate, regular rhythm and normal heart sounds.   Pulmonary/Chest: Effort normal and breath sounds  normal. No respiratory distress. He has no wheezes. He has no rales. He exhibits no tenderness.  Abdominal: Soft. Bowel sounds are normal. There is no tenderness. There is no rebound and no guarding.  Musculoskeletal: Normal range of motion. He exhibits no edema.  Lymphadenopathy:    He has no cervical adenopathy.  Neurological: He is alert and oriented to person, place, and time.  Skin: Skin is warm and  dry. No rash noted.  Psychiatric: He has a normal mood and affect.    ED Course  Procedures (including critical care time)  Labs Reviewed - No data to display Dg Chest 2 View  05/04/2011  *RADIOLOGY REPORT*  Clinical Data: Dyspnea.  Productive cough.  Smoker.  CHEST - 2 VIEW  Comparison: 03/31/2011.  Findings: Stable enlarged cardiac silhouette.  Clear lungs. Minimal central peribronchial thickening.  Unremarkable bones.  IMPRESSION:  1.  Minimal bronchitic changes. 2.  Stable cardiomegaly.  Original Report Authenticated By: Gerald Stabs, M.D.   Results for orders placed during the hospital encounter of 03/31/11  D-DIMER, QUANTITATIVE      Component Value Range   D-Dimer, Quant 0.44  0.00 - 0.48 (ug/mL-FEU)   Dg Chest 2 View  05/04/2011  *RADIOLOGY REPORT*  Clinical Data: Dyspnea.  Productive cough.  Smoker.  CHEST - 2 VIEW  Comparison: 03/31/2011.  Findings: Stable enlarged cardiac silhouette.  Clear lungs. Minimal central peribronchial thickening.  Unremarkable bones.  IMPRESSION:  1.  Minimal bronchitic changes. 2.  Stable cardiomegaly.  Original Report Authenticated By: Gerald Stabs, M.D.      1. Bronchitis   2. Hypertension       MDM  [name] has a long history of smoking feel his symptoms are likely bronchospasm. Will start him on an MDI he states he has used one of these in the past it's been a while. I did advise [name] to continue taking his antihypertensive regularly and have this rechecked by his primary care provider on March 1. There is nothing to suggest pulmonary embolus, pneumonia or acute coronary syndrome.        Malvin Johns, MD 05/04/11 1750

## 2011-05-04 NOTE — Discharge Instructions (Signed)

## 2011-05-16 DIAGNOSIS — I10 Essential (primary) hypertension: Secondary | ICD-10-CM | POA: Diagnosis not present

## 2011-05-16 DIAGNOSIS — R634 Abnormal weight loss: Secondary | ICD-10-CM | POA: Diagnosis not present

## 2011-05-16 DIAGNOSIS — IMO0002 Reserved for concepts with insufficient information to code with codable children: Secondary | ICD-10-CM | POA: Diagnosis not present

## 2011-05-21 DIAGNOSIS — I10 Essential (primary) hypertension: Secondary | ICD-10-CM | POA: Diagnosis not present

## 2011-05-21 DIAGNOSIS — F411 Generalized anxiety disorder: Secondary | ICD-10-CM | POA: Diagnosis not present

## 2011-05-22 ENCOUNTER — Other Ambulatory Visit: Payer: Self-pay

## 2011-05-22 ENCOUNTER — Inpatient Hospital Stay (HOSPITAL_COMMUNITY): Payer: Medicare Other

## 2011-05-22 ENCOUNTER — Inpatient Hospital Stay (HOSPITAL_COMMUNITY)
Admission: EM | Admit: 2011-05-22 | Discharge: 2011-05-25 | DRG: 291 | Disposition: A | Discharge status: Home / Self Care | Payer: Medicare Other | Attending: Family Medicine | Admitting: Family Medicine

## 2011-05-22 ENCOUNTER — Encounter (HOSPITAL_COMMUNITY): Payer: Self-pay | Admitting: Emergency Medicine

## 2011-05-22 ENCOUNTER — Emergency Department (HOSPITAL_COMMUNITY): Payer: Medicare Other

## 2011-05-22 DIAGNOSIS — F411 Generalized anxiety disorder: Secondary | ICD-10-CM | POA: Diagnosis present

## 2011-05-22 DIAGNOSIS — F17200 Nicotine dependence, unspecified, uncomplicated: Secondary | ICD-10-CM

## 2011-05-22 DIAGNOSIS — N189 Chronic kidney disease, unspecified: Secondary | ICD-10-CM | POA: Diagnosis not present

## 2011-05-22 DIAGNOSIS — R011 Cardiac murmur, unspecified: Secondary | ICD-10-CM

## 2011-05-22 DIAGNOSIS — Z7982 Long term (current) use of aspirin: Secondary | ICD-10-CM

## 2011-05-22 DIAGNOSIS — R109 Unspecified abdominal pain: Secondary | ICD-10-CM | POA: Diagnosis not present

## 2011-05-22 DIAGNOSIS — R0602 Shortness of breath: Secondary | ICD-10-CM

## 2011-05-22 DIAGNOSIS — R519 Headache, unspecified: Secondary | ICD-10-CM | POA: Diagnosis present

## 2011-05-22 DIAGNOSIS — Z9119 Patient's noncompliance with other medical treatment and regimen: Secondary | ICD-10-CM

## 2011-05-22 DIAGNOSIS — K4090 Unilateral inguinal hernia, without obstruction or gangrene, not specified as recurrent: Secondary | ICD-10-CM

## 2011-05-22 DIAGNOSIS — F3289 Other specified depressive episodes: Secondary | ICD-10-CM | POA: Diagnosis present

## 2011-05-22 DIAGNOSIS — N289 Disorder of kidney and ureter, unspecified: Secondary | ICD-10-CM

## 2011-05-22 DIAGNOSIS — R0789 Other chest pain: Secondary | ICD-10-CM | POA: Diagnosis present

## 2011-05-22 DIAGNOSIS — R1013 Epigastric pain: Secondary | ICD-10-CM | POA: Diagnosis not present

## 2011-05-22 DIAGNOSIS — I6789 Other cerebrovascular disease: Secondary | ICD-10-CM | POA: Diagnosis not present

## 2011-05-22 DIAGNOSIS — I5031 Acute diastolic (congestive) heart failure: Principal | ICD-10-CM

## 2011-05-22 DIAGNOSIS — I161 Hypertensive emergency: Secondary | ICD-10-CM

## 2011-05-22 DIAGNOSIS — F419 Anxiety disorder, unspecified: Secondary | ICD-10-CM

## 2011-05-22 DIAGNOSIS — R0989 Other specified symptoms and signs involving the circulatory and respiratory systems: Secondary | ICD-10-CM | POA: Diagnosis not present

## 2011-05-22 DIAGNOSIS — J9600 Acute respiratory failure, unspecified whether with hypoxia or hypercapnia: Secondary | ICD-10-CM | POA: Diagnosis present

## 2011-05-22 DIAGNOSIS — I10 Essential (primary) hypertension: Secondary | ICD-10-CM

## 2011-05-22 DIAGNOSIS — J189 Pneumonia, unspecified organism: Secondary | ICD-10-CM

## 2011-05-22 DIAGNOSIS — E876 Hypokalemia: Secondary | ICD-10-CM | POA: Diagnosis not present

## 2011-05-22 DIAGNOSIS — Z720 Tobacco use: Secondary | ICD-10-CM

## 2011-05-22 DIAGNOSIS — I080 Rheumatic disorders of both mitral and aortic valves: Secondary | ICD-10-CM | POA: Diagnosis present

## 2011-05-22 DIAGNOSIS — F1290 Cannabis use, unspecified, uncomplicated: Secondary | ICD-10-CM | POA: Diagnosis present

## 2011-05-22 DIAGNOSIS — J42 Unspecified chronic bronchitis: Secondary | ICD-10-CM | POA: Diagnosis present

## 2011-05-22 DIAGNOSIS — J90 Pleural effusion, not elsewhere classified: Secondary | ICD-10-CM | POA: Diagnosis not present

## 2011-05-22 DIAGNOSIS — I119 Hypertensive heart disease without heart failure: Secondary | ICD-10-CM

## 2011-05-22 DIAGNOSIS — I359 Nonrheumatic aortic valve disorder, unspecified: Secondary | ICD-10-CM

## 2011-05-22 DIAGNOSIS — I509 Heart failure, unspecified: Secondary | ICD-10-CM | POA: Diagnosis not present

## 2011-05-22 DIAGNOSIS — J069 Acute upper respiratory infection, unspecified: Secondary | ICD-10-CM | POA: Diagnosis present

## 2011-05-22 DIAGNOSIS — R809 Proteinuria, unspecified: Secondary | ICD-10-CM

## 2011-05-22 DIAGNOSIS — IMO0002 Reserved for concepts with insufficient information to code with codable children: Secondary | ICD-10-CM

## 2011-05-22 DIAGNOSIS — Z79899 Other long term (current) drug therapy: Secondary | ICD-10-CM

## 2011-05-22 DIAGNOSIS — Z91199 Patient's noncompliance with other medical treatment and regimen due to unspecified reason: Secondary | ICD-10-CM

## 2011-05-22 DIAGNOSIS — F1210 Cannabis abuse, uncomplicated: Secondary | ICD-10-CM | POA: Diagnosis present

## 2011-05-22 DIAGNOSIS — F329 Major depressive disorder, single episode, unspecified: Secondary | ICD-10-CM | POA: Diagnosis present

## 2011-05-22 DIAGNOSIS — I517 Cardiomegaly: Secondary | ICD-10-CM | POA: Diagnosis present

## 2011-05-22 DIAGNOSIS — I160 Hypertensive urgency: Secondary | ICD-10-CM

## 2011-05-22 DIAGNOSIS — R51 Headache: Secondary | ICD-10-CM

## 2011-05-22 DIAGNOSIS — I129 Hypertensive chronic kidney disease with stage 1 through stage 4 chronic kidney disease, or unspecified chronic kidney disease: Secondary | ICD-10-CM | POA: Diagnosis present

## 2011-05-22 DIAGNOSIS — R079 Chest pain, unspecified: Secondary | ICD-10-CM | POA: Diagnosis present

## 2011-05-22 DIAGNOSIS — E785 Hyperlipidemia, unspecified: Secondary | ICD-10-CM

## 2011-05-22 HISTORY — DX: Anxiety disorder, unspecified: F41.9

## 2011-05-22 HISTORY — DX: Gastro-esophageal reflux disease without esophagitis: K21.9

## 2011-05-22 LAB — URINALYSIS, ROUTINE W REFLEX MICROSCOPIC
Glucose, UA: NEGATIVE mg/dL
Leukocytes, UA: NEGATIVE
Specific Gravity, Urine: 1.022 (ref 1.005–1.030)
pH: 6 (ref 5.0–8.0)

## 2011-05-22 LAB — DIFFERENTIAL
Basophils Absolute: 0 10*3/uL (ref 0.0–0.1)
Eosinophils Relative: 1 % (ref 0–5)
Lymphocytes Relative: 21 % (ref 12–46)
Monocytes Absolute: 0.5 10*3/uL (ref 0.1–1.0)

## 2011-05-22 LAB — CBC
HCT: 38 % — ABNORMAL LOW (ref 39.0–52.0)
MCHC: 34.7 g/dL (ref 30.0–36.0)
MCV: 95.2 fL (ref 78.0–100.0)
RDW: 13.2 % (ref 11.5–15.5)
WBC: 5 10*3/uL (ref 4.0–10.5)

## 2011-05-22 LAB — CARDIAC PANEL(CRET KIN+CKTOT+MB+TROPI)
CK, MB: 1.7 ng/mL (ref 0.3–4.0)
Relative Index: 1.2 (ref 0.0–2.5)
Total CK: 140 U/L (ref 7–232)

## 2011-05-22 LAB — LIPASE, BLOOD: Lipase: 16 U/L (ref 11–59)

## 2011-05-22 LAB — POCT I-STAT TROPONIN I: Troponin i, poc: 0.04 ng/mL (ref 0.00–0.08)

## 2011-05-22 LAB — COMPREHENSIVE METABOLIC PANEL
AST: 23 U/L (ref 0–37)
CO2: 23 mEq/L (ref 19–32)
Calcium: 8.8 mg/dL (ref 8.4–10.5)
Creatinine, Ser: 1.57 mg/dL — ABNORMAL HIGH (ref 0.50–1.35)
GFR calc Af Amer: 63 mL/min — ABNORMAL LOW (ref >90)
GFR calc non Af Amer: 54 mL/min — ABNORMAL LOW (ref >90)

## 2011-05-22 LAB — RAPID URINE DRUG SCREEN, HOSP PERFORMED
Benzodiazepines: POSITIVE — AB
Cocaine: NOT DETECTED
Opiates: NOT DETECTED

## 2011-05-22 LAB — URINE MICROSCOPIC-ADD ON

## 2011-05-22 LAB — ETHANOL: Alcohol, Ethyl (B): <11 mg/dL (ref 0–11)

## 2011-05-22 LAB — MRSA PCR SCREENING: MRSA by PCR: NEGATIVE

## 2011-05-22 LAB — PRO B NATRIURETIC PEPTIDE: Pro B Natriuretic peptide (BNP): 7080 pg/mL — ABNORMAL HIGH (ref 0–125)

## 2011-05-22 MED ORDER — SERTRALINE HCL 50 MG PO TABS
50.0000 mg | ORAL_TABLET | Freq: Every day | ORAL | Status: DC
  Administered 2011-05-22 – 2011-05-25 (×4): 50 mg via ORAL
  Filled 2011-05-22 (×4): qty 1

## 2011-05-22 MED ORDER — FUROSEMIDE 10 MG/ML IJ SOLN
40.0000 mg | Freq: Once | INTRAMUSCULAR | Status: AC
  Administered 2011-05-22: 40 mg via INTRAVENOUS
  Filled 2011-05-22: qty 4

## 2011-05-22 MED ORDER — PANTOPRAZOLE SODIUM 40 MG PO TBEC
40.0000 mg | DELAYED_RELEASE_TABLET | Freq: Every day | ORAL | Status: DC
  Administered 2011-05-23 – 2011-05-25 (×3): 40 mg via ORAL
  Filled 2011-05-22 (×3): qty 1

## 2011-05-22 MED ORDER — LABETALOL HCL 5 MG/ML IV SOLN
40.0000 mg | Freq: Once | INTRAVENOUS | Status: AC
  Administered 2011-05-22: 40 mg via INTRAVENOUS

## 2011-05-22 MED ORDER — ASPIRIN EC 81 MG PO TBEC
81.0000 mg | DELAYED_RELEASE_TABLET | Freq: Every day | ORAL | Status: DC
  Administered 2011-05-22 – 2011-05-25 (×4): 81 mg via ORAL
  Filled 2011-05-22 (×4): qty 1

## 2011-05-22 MED ORDER — POTASSIUM CHLORIDE CRYS ER 20 MEQ PO TBCR: 20.0000 meq | EXTENDED_RELEASE_TABLET | Freq: Two times a day (BID) | ORAL | Status: DC

## 2011-05-22 MED ORDER — NITROGLYCERIN IN D5W 200-5 MCG/ML-% IV SOLN: 5.0000 ug/min | Freq: Once | INTRAVENOUS | Status: DC

## 2011-05-22 MED ORDER — ALBUTEROL SULFATE HFA 108 (90 BASE) MCG/ACT IN AERS
1.0000 | INHALATION_SPRAY | Freq: Four times a day (QID) | RESPIRATORY_TRACT | Status: DC | PRN
  Filled 2011-05-22: qty 6.7

## 2011-05-22 MED ORDER — ACETAMINOPHEN 500 MG PO TABS
1000.0000 mg | ORAL_TABLET | Freq: Once | ORAL | Status: AC
  Administered 2011-05-22: 1000 mg via ORAL

## 2011-05-22 MED ORDER — HYDROMORPHONE HCL PF 1 MG/ML IJ SOLN
1.0000 mg | Freq: Once | INTRAMUSCULAR | Status: AC
  Administered 2011-05-22: 1 mg via INTRAVENOUS
  Filled 2011-05-22: qty 1

## 2011-05-22 MED ORDER — NITROGLYCERIN IN D5W 200-5 MCG/ML-% IV SOLN
20.0000 ug/min | INTRAVENOUS | Status: DC
  Administered 2011-05-22: 20 ug/min via INTRAVENOUS
  Filled 2011-05-22: qty 250

## 2011-05-22 MED ORDER — ONDANSETRON HCL 4 MG PO TABS: 4.0000 mg | ORAL_TABLET | Freq: Four times a day (QID) | ORAL | Status: DC | PRN

## 2011-05-22 MED ORDER — LABETALOL HCL 5 MG/ML IV SOLN
20.0000 mg | Freq: Once | INTRAVENOUS | Status: AC
  Administered 2011-05-22: 20 mg via INTRAVENOUS
  Filled 2011-05-22: qty 4

## 2011-05-22 MED ORDER — SODIUM CHLORIDE 0.9 % IJ SOLN
3.0000 mL | Freq: Two times a day (BID) | INTRAMUSCULAR | Status: DC
  Administered 2011-05-23 – 2011-05-24 (×3): 3 mL via INTRAVENOUS

## 2011-05-22 MED ORDER — FUROSEMIDE 10 MG/ML IJ SOLN
80.0000 mg | Freq: Once | INTRAMUSCULAR | Status: AC
  Administered 2011-05-22: 80 mg via INTRAVENOUS
  Filled 2011-05-22: qty 8

## 2011-05-22 MED ORDER — SODIUM CHLORIDE 0.9 % IV SOLN
INTRAVENOUS | Status: DC
Start: 2011-05-22 — End: 2011-05-22

## 2011-05-22 MED ORDER — SODIUM CHLORIDE 0.9 % IV BOLUS (SEPSIS)
500.0000 mL | Freq: Once | INTRAVENOUS | Status: AC
  Administered 2011-05-22: 500 mL via INTRAVENOUS

## 2011-05-22 MED ORDER — LORAZEPAM 0.5 MG PO TABS: 1.0000 mg | ORAL_TABLET | Freq: Three times a day (TID) | ORAL | Status: DC | PRN

## 2011-05-22 MED ORDER — POTASSIUM CHLORIDE CRYS ER 20 MEQ PO TBCR
40.0000 meq | EXTENDED_RELEASE_TABLET | Freq: Two times a day (BID) | ORAL | Status: DC
  Administered 2011-05-22: 40 meq via ORAL
  Filled 2011-05-22 (×3): qty 2

## 2011-05-22 MED ORDER — NITROGLYCERIN 0.3 MG SL SUBL
0.3000 mg | SUBLINGUAL_TABLET | SUBLINGUAL | Status: DC | PRN
  Filled 2011-05-22: qty 100

## 2011-05-22 MED ORDER — ONDANSETRON HCL 4 MG/2ML IJ SOLN
4.0000 mg | Freq: Four times a day (QID) | INTRAMUSCULAR | Status: DC | PRN
  Administered 2011-05-22: 4 mg via INTRAVENOUS
  Filled 2011-05-22: qty 2

## 2011-05-22 MED ORDER — LISINOPRIL 20 MG PO TABS
20.0000 mg | ORAL_TABLET | Freq: Every day | ORAL | Status: DC
  Administered 2011-05-22 – 2011-05-25 (×4): 20 mg via ORAL
  Filled 2011-05-22 (×4): qty 1

## 2011-05-22 MED ORDER — LABETALOL HCL 5 MG/ML IV SOLN
2.0000 mg/min | INTRAVENOUS | Status: DC
  Administered 2011-05-22 (×2): 2 mg/min via INTRAVENOUS
  Administered 2011-05-23: 1 mg/min via INTRAVENOUS
  Filled 2011-05-22 (×5): qty 100

## 2011-05-22 MED ORDER — SODIUM CHLORIDE 0.9 % IJ SOLN: 3.0000 mL | INTRAMUSCULAR | Status: DC | PRN

## 2011-05-22 MED ORDER — CEFTRIAXONE SODIUM 1 G IJ SOLR
1.0000 g | INTRAMUSCULAR | Status: DC
  Administered 2011-05-22: 1 g via INTRAVENOUS
  Filled 2011-05-22 (×2): qty 10

## 2011-05-22 MED ORDER — ONDANSETRON HCL 4 MG/2ML IJ SOLN
4.0000 mg | Freq: Once | INTRAMUSCULAR | Status: AC
  Administered 2011-05-22: 4 mg via INTRAVENOUS
  Filled 2011-05-22: qty 2

## 2011-05-22 MED ORDER — LABETALOL HCL 5 MG/ML IV SOLN
INTRAVENOUS | Status: AC
  Administered 2011-05-22: 40 mg via INTRAVENOUS
  Filled 2011-05-22: qty 8

## 2011-05-22 MED ORDER — HYDROMORPHONE HCL PF 1 MG/ML IJ SOLN
1.0000 mg | Freq: Once | INTRAMUSCULAR | Status: AC
  Administered 2011-05-22: 1 mg via INTRAVENOUS

## 2011-05-22 MED ORDER — FUROSEMIDE 40 MG PO TABS
40.0000 mg | ORAL_TABLET | Freq: Every day | ORAL | Status: DC
  Filled 2011-05-22: qty 1

## 2011-05-22 MED ORDER — SODIUM CHLORIDE 0.9 % IV SOLN
250.0000 mL | INTRAVENOUS | Status: DC | PRN
  Administered 2011-05-22: 250 mL via INTRAVENOUS

## 2011-05-22 MED ORDER — LABETALOL HCL 5 MG/ML IV SOLN
1.0000 mg/min | INTRAVENOUS | Status: AC
  Administered 2011-05-22: 1 mg/min via INTRAVENOUS
  Filled 2011-05-22: qty 100

## 2011-05-22 MED ORDER — SPIRONOLACTONE 25 MG PO TABS
25.0000 mg | ORAL_TABLET | Freq: Every day | ORAL | Status: DC
  Administered 2011-05-22 – 2011-05-25 (×4): 25 mg via ORAL
  Filled 2011-05-22 (×4): qty 1

## 2011-05-22 MED ORDER — MORPHINE SULFATE 2 MG/ML IJ SOLN
2.0000 mg | Freq: Once | INTRAMUSCULAR | Status: AC
  Administered 2011-05-22: 2 mg via INTRAVENOUS
  Filled 2011-05-22: qty 1

## 2011-05-22 MED ORDER — LABETALOL HCL 5 MG/ML IV SOLN
1.0000 mg/min | INTRAVENOUS | Status: AC
  Administered 2011-05-22: 1 mg/min via INTRAVENOUS

## 2011-05-22 MED ORDER — CLONIDINE HCL 0.1 MG PO TABS
0.1000 mg | ORAL_TABLET | Freq: Once | ORAL | Status: AC
  Administered 2011-05-22: 0.1 mg via ORAL
  Filled 2011-05-22: qty 1

## 2011-05-22 MED ORDER — HYDROMORPHONE HCL PF 1 MG/ML IJ SOLN
INTRAMUSCULAR | Status: AC
  Administered 2011-05-22: 1 mg via INTRAVENOUS
  Filled 2011-05-22: qty 1

## 2011-05-22 MED ORDER — ACETAMINOPHEN 325 MG PO TABS
650.0000 mg | ORAL_TABLET | Freq: Four times a day (QID) | ORAL | Status: DC | PRN
  Administered 2011-05-25: 650 mg via ORAL
  Filled 2011-05-22: qty 2

## 2011-05-22 MED ORDER — MORPHINE SULFATE 2 MG/ML IJ SOLN
2.0000 mg | INTRAMUSCULAR | Status: DC | PRN
  Administered 2011-05-22: 2 mg via INTRAVENOUS
  Filled 2011-05-22: qty 1

## 2011-05-22 MED ORDER — FUROSEMIDE 10 MG/ML IJ SOLN
40.0000 mg | Freq: Two times a day (BID) | INTRAMUSCULAR | Status: DC
  Filled 2011-05-22 (×2): qty 4

## 2011-05-22 MED ORDER — NICARDIPINE HCL IN NACL 20-0.86 MG/200ML-% IV SOLN
5.0000 mg/h | INTRAVENOUS | Status: DC
  Filled 2011-05-22: qty 200

## 2011-05-22 MED ORDER — NITROGLYCERIN 0.4 MG SL SUBL: 0.4000 mg | SUBLINGUAL_TABLET | SUBLINGUAL | Status: DC | PRN

## 2011-05-22 NOTE — ED Notes (Signed)
MD aware of pts pain

## 2011-05-22 NOTE — ED Notes (Signed)
Decreased nitro gtt to 31mcg/min per Internal Med orders

## 2011-05-22 NOTE — Consult Note (Addendum)
CARDIOLOGY CONSULT NOTE  Patient ID: [name], MRN: [id number], DOB/AGE: 06/14/1970 41 y.o. Admit date: 05/22/2011 Date of Consult: 05/22/2011  Primary Cardiologist: New to [place]  Chief Complaint: shortness of breath  HPI: 41 y/o M with hx of HTN, prior noncompliance with meds presented to [facility] with complaints of SOB, nausea, and headache. He has been having progressive cough and dyspnea for the last several months, but has had increased symptoms over the last week including chest discomfort. He reports that he has been working with Urgent Care on [address] to titrate his medicines and has been compliant for 4 months but was previously was out of meds for 2 years. He reports that his typical BP runs 160-200 at Urgent Care. He presented to [facility] with these above complaints and a BP of 190/132. He was started on IV labetalol and IV NTG, as well as 40mg  of IV lasix with output of approximately 1200cc. BNP was 7080. UA showed >300 protein. Cr is 1.57. UDS was positive for benzos and THC. Plt decreased at 119. POC troponin is negative at 0.04. EKG shows LVH with nonspecific ST-T changes. He currently has persistent dyspnea and headache.  Past Medical History  Diagnosis Date  . Hypertension     Hx of HTN urgency secondary to noncompliance  . Anxiety   . GERD (gastroesophageal reflux disease)   . Acute renal failure     June 2012 felt secondary to Toradol     2D Echo 08/2010 Study Conclusions - Left ventricle: Wall thickness was increased in a pattern of moderate LVH. Systolic function was normal. The estimated ejection fraction was in the range of 55% to 60%. - Aortic valve: Moderate regurgitation. - Left atrium: The atrium was mildly dilated. - Pulmonary arteries: PA peak pressure: 85mm Hg (S).  Surgical History:  Past Surgical History  Procedure Date  . Hernia repair   . Ankle surgery      Home Meds: Medication Sig  albuterol (PROVENTIL HFA;VENTOLIN  HFA) 108 (90 BASE) MCG/ACT inhaler Inhale 1-2 puffs into the lungs every 6 (six) hours as needed for wheezing.  ALPRAZolam (XANAX) 1 MG tablet Take 1 mg by mouth at bedtime.  benzonatate (TESSALON) 100 MG capsule Take 100 mg by mouth every 8 (eight) hours.  lisinopril (PRINIVIL,ZESTRIL) 20 MG tablet Take 0.5 tablets (10 mg total) by mouth daily.  nebivolol (BYSTOLIC) 10 MG tablet Take 10 mg by mouth daily.  sertraline (ZOLOFT) 50 MG tablet Take 50 mg by mouth daily.    Inpatient Medications:     . acetaminophen  1,000 mg Oral Once  . aspirin EC  81 mg Oral Daily  . cefTRIAXone (ROCEPHIN)  IV  1 g Intravenous Q24H  . furosemide  40 mg Intravenous Once  . furosemide  40 mg Intravenous Q12H  .  HYDROmorphone (DILAUDID) injection  1 mg Intravenous Once  . labetalol (NORMODYNE) infusion  1 mg/min Intravenous To Major  . labetalol (NORMODYNE) infusion  1 mg/min Intravenous To Major  . labetalol  20 mg Intravenous Once  . labetalol  40 mg Intravenous Once  .  morphine injection  2 mg Intravenous Once  . ondansetron (ZOFRAN) IV  4 mg Intravenous Once  . pantoprazole  40 mg Oral Q0600  . potassium chloride  20 mEq Oral BID  . sertraline  50 mg Oral Daily  . sodium chloride  500 mL Intravenous Once  . sodium chloride  3 mL Intravenous Q12H  .  DISCONTD: nitroGLYCERIN  5-200 mcg/min Intravenous Once    Allergies: No Known Allergies  History   Social History  . Marital Status: Married    Spouse Name: N/A    Number of Children: N/A  . Years of Education: N/A   Occupational History  . Not on file.   Social History Main Topics  . Smoking status: Current Some Day Smoker -- 0.2 packs/day for 20 years    Types: Cigarettes  . Smokeless tobacco: Not on file  . Alcohol Use: Yes     occ  . Drug Use: No  . Sexually Active: Yes   Other Topics Concern  . Not on file   Social History Narrative  . No narrative on file     Family History  Problem Relation Age of Onset  . Hypertension        Review of Systems: General: negative for chills, fever, night sweats  Cardiovascular: see above Dermatological: negative for rash Respiratory: occasional cough, no hemoptysis Urologic: negative for hematuria Abdominal: negative for nausea, vomiting, diarrhea, bright red blood per rectum, melena, or hematemesis Neurologic: negative for visual changes, syncope, or dizziness. + constant HA + anxiety All other systems reviewed and are otherwise negative except as noted above.  Labs: POC troponin is negative Lab Results  Component Value Date   WBC 5.0 05/22/2011   HGB 13.2 05/22/2011   HCT 38.0* 05/22/2011   MCV 95.2 05/22/2011   PLT 119* 05/22/2011     Lab 05/22/11 0453  NA 140  K 3.3*  CL 106  CO2 23  BUN 12  CREATININE 1.57*  CALCIUM 8.8  PROT 6.2  BILITOT 0.7  ALKPHOS 89  ALT 22  AST 23  GLUCOSE 97   Lab Results  Component Value Date   CHOL 180 09/09/2010   HDL 50 09/09/2010   LDLCALC 112* 09/09/2010   TRIG 90 09/09/2010   Lab Results  Component Value Date   DDIMER 0.44 03/31/2011    Radiology/Studies:  1. Chest 2 View 05/04/2011  *RADIOLOGY REPORT*  Clinical Data: Dyspnea.  Productive cough.  Smoker.  CHEST - 2 VIEW  Comparison: 03/31/2011.  Findings: Stable enlarged cardiac silhouette.  Clear lungs. Minimal central peribronchial thickening.  Unremarkable bones.  IMPRESSION:  1.  Minimal bronchitic changes. 2.  Stable cardiomegaly.  Original Report Authenticated By: Gerald Stabs, M.D.   2. Abd Acute W/chest 05/22/2011  *RADIOLOGY REPORT*  Clinical Data: Abdominal pain, shortness of breath, worsening, history hypertension  ACUTE ABDOMEN SERIES (ABDOMEN 2 VIEW & CHEST 1 VIEW)  Comparison: Chest radiograph 05/04/2011  Findings: Enlargement of cardiac silhouette. Pulmonary vascular congestion. Tortuous aorta. Bilateral perihilar infiltrates extending to lung bases, question edema versus infection. Small left pleural effusion.  Numerous cardiac monitoring lines project over  chest and abdomen. Nonobstructive bowel gas pattern. No bowel dilatation, bowel wall thickening, or free intraperitoneal air. Bones unremarkable. No urinary tract calcification. Small pelvic phleboliths.  IMPRESSION: No acute abdominal findings. Enlargement of cardiac silhouette with pulmonary vascular congestion and diffuse bilateral pulmonary infiltrates, question edema versus infection. Small left pleural effusion.  Original Report Authenticated By: Burnetta Sabin, M.D.   EKG: NSR 92bpm high voltage LVH with nonspecific ST-T changes  Physical Exam: Blood pressure 164/119, pulse 79, temperature 98.9 F (37.2 C), temperature source Oral, resp. rate 24, height 6' (1.829 m), weight 153 lb (69.4 kg), SpO2 97.00%. General: Well developed, well nourished AAM. C/o HA. + hacking cough. anxious Head: Normocephalic, atraumatic, sclera non-icteric, no xanthomas, nares  are without discharge.  Neck: Negative for carotid bruits. JVD 8-9 Lungs: Bibasilar crackles noted without wheezes or rhonchi. Breathing is unlabored. Heart: RRR with S1 S2. No rubs, or gallops appreciated. 2/6 MR murmur Abdomen: Soft, non-tender, non-distended with normoactive bowel sounds. No hepatomegaly. No rebound/guarding. No obvious abdominal masses. Msk:  Strength and tone appear normal for age. Extremities: No clubbing or cyanosis. No edema.  Distal pedal pulses are 2+ and equal bilaterally. Neuro: Alert and oriented X 3. Moves all extremities spontaneously. Psych:  Responds to questions appropriately with a normal affect.    Assessment and Plan:   1. Hypertensive urgency 2. Acute CHF, likely diastolic 3. Renal insufficiency of uncertain chronicity 4. Chronic headaches with nausea/vomiting 5. Acute respiratory failure 6. H/o noncompliance 7. Proteinuria  Signed, Melina Copa PA-C 05/22/2011, 5:35 PM  Patient seen and examined with Dayna Dunn PA-C. We discussed all aspects of the encounter. I agree with the assessment and  plan as stated above.   Tough case. He has severe HA with associated HTN crisis and diastolic HF. He tells me that he has been trying to work with his doc at Urgent Care to get his meds straightened out but I have concerns about his compliance. I had a long talk with him about the risks of untreated severe HTN including renal failure and stroke. He feels his cdoctors aren't listening to him.  I think it is imperative that we find a stable anti-HTN regiment that works for him prior to d/c and arrange close f/u so that he can stay on it. This regimen should probably include and ACE-I, diuretic (preferably spironolactone), amlodipine and possibly clonidine as needed.  Will wean NTG now. Give dose of clonidine and IV lasix. Will wean down IV labetalol as tolerated. Given HAs, severe HTN and n/v needs head CT to r/o bleed or mass. Will check PRA levels to r/o hyperaldo.  Goal SBP 130-160 for now with DBP < 100.   Will follow. Time spent 60 minutes.   Benay Spice 5:58 PM

## 2011-05-22 NOTE — ED Notes (Signed)
Increased Nitro gtt to 10mcg/min per md orders

## 2011-05-22 NOTE — ED Provider Notes (Cosign Needed)
Patient had been admitted by Dr. Mia Creek at the end of his shift. However the [facility] resident called me at 10:40 and states that [facility].  Pt states his blood pressure has been very hard to control and he's been having his medications increased over the past couple weeks. He states he was last seen by his doctor 6 days ago and 2 days ago when they increased the dose of his medicine. He states he presented last night with headache, chest pain, shortness of breath, and abdominal pain. Patient is currently on a nitroglycerin and Normodyne drip. His blood pressure of the monitor was 139/102.  1210 Dr. Christin Bach states she won't admit until he has a troponin done because it would make a difference what patient care area he would be admitted to.  Troponin ordered.   POCT I-STAT TROPONIN I      Component Value Range   Troponin i, poc 0.04  0.00 - 0.08 (ng/mL)   Comment 3             13:50 Dr Murvin Natal, admit to stepdown, to [facility] team 8 to Dr Wynetta Emery Orders written   Rolland Porter, MD, Alanson Aly, MD 05/22/11 [phone]

## 2011-05-22 NOTE — ED Notes (Signed)
Internal Med at bedside.

## 2011-05-22 NOTE — ED Provider Notes (Cosign Needed)
History     CSN: [id number]  Arrival date & time 05/22/11  0340   First MD Initiated Contact with Patient 05/22/11 [phone]      Chief Complaint  Patient presents with  . Shortness of Breath    SOB for 4 months on and off, tonight worse. seen by primary care provider yesterday morningand given inhaler. not any better.    (Consider location/radiation/quality/duration/timing/severity/associated sxs/prior treatment) HPI Comments: [name] is a 41 y.o. male who is here for abdominal pain for 2 days and intermittent shortness of breath for several months; associated with vomiting for 2 days and back pain. He recently saw his doctor and was started on albuterol inhaler, without relief of trouble breathing. He states he drinks alcohol socially, does not use cocaine or marijuana. He denies change in bowel or urinary habits. He has no trouble walking, he is not weak and dizzy, and has no paresthesias. He is his current medicines today without relief.  The history is provided by the patient.    Past Medical History  Diagnosis Date  . Hypertension   . Anxiety   . Shortness of breath   . GERD (gastroesophageal reflux disease)   . Headache     Past Surgical History  Procedure Date  . Hernia repair     History reviewed. No pertinent family history.  History  Substance Use Topics  . Smoking status: Current Some Day Smoker -- 0.2 packs/day for 20 years    Types: Cigarettes  . Smokeless tobacco: Not on file  . Alcohol Use: Yes     occ      Review of Systems  Respiratory: Positive for shortness of breath.   All other systems reviewed and are negative.    Allergies  Review of patient's allergies indicates no known allergies.  Home Medications   No current outpatient prescriptions on file.  BP 147/97  Pulse 72  Temp(Src) 98.9 F (37.2 C) (Oral)  Resp 21  Ht 6' (1.829 m)  Wt 153 lb (69.4 kg)  BMI 20.75 kg/m2  SpO2 98%  Physical Exam  Nursing note and vitals  reviewed. Constitutional: He is oriented to person, place, and time. He appears well-developed and well-nourished. He appears distressed (He appears uncomfortable).       He has marked hypertension  HENT:  Head: Normocephalic and atraumatic.  Right Ear: External ear normal.  Left Ear: External ear normal.  Eyes: Conjunctivae and EOM are normal. Pupils are equal, round, and reactive to light.  Neck: Normal range of motion and phonation normal. Neck supple.  Cardiovascular: Normal rate, regular rhythm, normal heart sounds and intact distal pulses.   Pulmonary/Chest: Effort normal and breath sounds normal. He exhibits no bony tenderness.  Abdominal: Soft. Normal appearance. There is tenderness (Moderate epigastric tenderness, diffusely).  Musculoskeletal: Normal range of motion.       Tender lumbar diffusely to light touch  Neurological: He is alert and oriented to person, place, and time. He has normal strength. No cranial nerve deficit or sensory deficit. He exhibits normal muscle tone. Coordination normal.  Skin: Skin is warm, dry and intact.  Psychiatric: His behavior is normal.       He is anxious    ED Course  Procedures (including critical care time) Emergency department treatment: IV fluids. IV, Dilaudid, and Zofran; imaging and labs ordered.   Date: 05/22/2011  Rate: 92  Rhythm: normal sinus rhythm  QRS Axis: normal  Intervals: normal  ST/T Wave abnormalities: normal  Conduction Disutrbances:nonspecific intraventricular conduction delay  Narrative Interpretation: LVH, right and left atrial enlargement, poor R wave progression  Old EKG Reviewed: unchanged 4:53 PM Reevaluation with update and discussion. After initial assessment and treatment, an updated evaluation reveals persistent hypertension despite treatment with analgesic medication. Chest x-ray consistent with congestive heart failure. Patient likely has hypertensive crisis. Treatment begun, nitroglycerin drip, and Lasix  IV. Initial MAP 151, Goal MAP 114. Demont Linford L   08:14- MAP 142; NTG increased to 60 mcg/min. Will add Labetolol. He states his abdominal pain, is now gone. He has a mild headache. He denies blurred vision, or weakness.  08:54- MAP - 119    CRITICAL CARE Performed by: Daleen Bo L   Total critical care time: 50 min  Critical care time was exclusive of separately billable procedures and treating other patients.  Critical care was necessary to treat or prevent imminent or life-threatening deterioration.  Critical care was time spent personally by me on the following activities: development of treatment plan with patient and/or surrogate as well as nursing, discussions with consultants, evaluation of patient's response to treatment, examination of patient, obtaining history from patient or surrogate, ordering and performing treatments and interventions, ordering and review of laboratory studies, ordering and review of radiographic studies, pulse oximetry and re-evaluation of patient's condition.  Labs Reviewed  URINALYSIS, ROUTINE W REFLEX MICROSCOPIC - Abnormal; Notable for the following:    Hgb urine dipstick TRACE (*)    Protein, ur >300 (*)    All other components within normal limits  URINE RAPID DRUG SCREEN (HOSP PERFORMED) - Abnormal; Notable for the following:    Benzodiazepines POSITIVE (*)    Tetrahydrocannabinol POSITIVE (*)    All other components within normal limits  CBC - Abnormal; Notable for the following:    RBC 3.99 (*)    HCT 38.0 (*)    Platelets 119 (*) PLATELET COUNT CONFIRMED BY SMEAR   All other components within normal limits  COMPREHENSIVE METABOLIC PANEL - Abnormal; Notable for the following:    Potassium 3.3 (*)    Creatinine, Ser 1.57 (*)    Albumin 3.1 (*)    GFR calc non Af Amer 54 (*)    GFR calc Af Amer 63 (*)    All other components within normal limits  PRO B NATRIURETIC PEPTIDE - Abnormal; Notable for the following:    Pro B  Natriuretic peptide (BNP) 7080.0 (*)    All other components within normal limits  ETHANOL  DIFFERENTIAL  LIPASE, BLOOD  URINE MICROSCOPIC-ADD ON  POCT I-STAT TROPONIN I  MRSA PCR SCREENING   Dg Abd Acute W/chest  05/22/2011  *RADIOLOGY REPORT*  Clinical Data: Abdominal pain, shortness of breath, worsening, history hypertension  ACUTE ABDOMEN SERIES (ABDOMEN 2 VIEW & CHEST 1 VIEW)  Comparison: Chest radiograph 05/04/2011  Findings: Enlargement of cardiac silhouette. Pulmonary vascular congestion. Tortuous aorta. Bilateral perihilar infiltrates extending to lung bases, question edema versus infection. Small left pleural effusion.  Numerous cardiac monitoring lines project over chest and abdomen. Nonobstructive bowel gas pattern. No bowel dilatation, bowel wall thickening, or free intraperitoneal air. Bones unremarkable. No urinary tract calcification. Small pelvic phleboliths.  IMPRESSION: No acute abdominal findings. Enlargement of cardiac silhouette with pulmonary vascular congestion and diffuse bilateral pulmonary infiltrates, question edema versus infection. Small left pleural effusion.  Original Report Authenticated By: Burnetta Sabin, M.D.     1. Hypertensive urgency   2. Abdominal pain   3. Congestive heart failure  MDM  Hypertensive crises with new onset CHF. Pt treated and improved in ED and needs admission for stabilization. Step-down ICU indicated for management of IV drips.        Richarda Blade, MD 05/22/11 [phone]

## 2011-05-22 NOTE — ED Notes (Signed)
Departure condition documented on wrong pt

## 2011-05-22 NOTE — H&P (Addendum)
History and Physical Examination  Date: 05/22/2011  Patient name: [name] Medical record number: [id number] Date of birth: 1970-07-04 Age: 41 y.o. Gender: male PCP: Pcp Not In System  Attending physician: Murlean Iba, MD  Chief Complaint:  Chief Complaint  Patient presents with  . Shortness of Breath    SOB for 4 months on and off, tonight worse. seen by primary care provider yesterday morningand given inhaler. not any better.     History of Present Illness: [name] is an 41 y.o. male with a long history of poorly controlled hypertension.  He had been working with an outpatient clinic trying to get his blood pressures are controlled with recent changes in his blood pressure medications however he presented to the emergency department complaining of abdominal pain initially and also has shortness of breath and chest pain.  He was found to have severe hypertension at the time and started on IV labetalol and nitroglycerin.  He'll be monitored for several hours in the emergency department and was initially admitted by another physician however it turned out that he was a hospitalist patient.  [facility] was called to evaluate him to admit.  The patient had several tests done including a chest x-ray which revealed cardiomegaly and pulmonary edema.  He had been given a dose of IV furosemide 40 mg in the emergency department and began to diaries approximately 1500 mL.  Upon reviewing some of the patient's past medical history and medical records I learned that he had been treated for anxiety and depression, hypertension as mentioned above and also he was found to have a positive urine drug test for marijuana and benzodiazepine.  He normally takes alprazolam for his anxiety disorder.  The patient has experienced some improvement in symptoms since having better control his blood pressure however he still having significant shortness of breath and occasional chest pain.  Hospital  admission was requested for further evaluation and management of his complicated medical condition.   Past Medical History Past Medical History  Diagnosis Date  . Hypertension   . Anxiety   . Shortness of breath   . GERD (gastroesophageal reflux disease)   . Headache     Past Surgical History Past Surgical History  Procedure Date  . Hernia repair     Home Meds: Prior to Admission medications   Medication Sig Start Date End Date Taking? Authorizing Provider  albuterol (PROVENTIL HFA;VENTOLIN HFA) 108 (90 BASE) MCG/ACT inhaler Inhale 1-2 puffs into the lungs every 6 (six) hours as needed for wheezing. 05/04/11 05/03/12 Yes Malvin Johns, MD  ALPRAZolam Duanne Moron) 1 MG tablet Take 1 mg by mouth at bedtime.   Yes Historical Provider, MD  benzonatate (TESSALON) 100 MG capsule Take 100 mg by mouth every 8 (eight) hours.   Yes Historical Provider, MD  lisinopril (PRINIVIL,ZESTRIL) 20 MG tablet Take 0.5 tablets (10 mg total) by mouth daily. 03/31/11 03/30/12 Yes Barbara Cower, MD  nebivolol (BYSTOLIC) 10 MG tablet Take 10 mg by mouth daily.   Yes Historical Provider, MD  sertraline (ZOLOFT) 50 MG tablet Take 50 mg by mouth daily.   Yes Historical Provider, MD    Allergies: Review of patient's allergies indicates no known allergies.  Social History:  History   Social History  . Marital Status: Married    Spouse Name: N/A    Number of Children: N/A  . Years of Education: N/A   Occupational History  . Not on file.   Social History Main  Topics  . Smoking status: Current Some Day Smoker -- 0.2 packs/day for 20 years    Types: Cigarettes  . Smokeless tobacco: Not on file  . Alcohol Use: Yes     occ  . Drug Use: No  . Sexually Active: Yes   Other Topics Concern  . Not on file   Social History Narrative  . No narrative on file   Family History: History reviewed. No pertinent family history.  Review of Systems: A comprehensive review of systems was negative except for:  Eyes: positive for visual disturbance Ears, nose, mouth, throat, and face: positive for sore throat Cardiovascular: positive for chest pain, chest pressure/discomfort, dyspnea, exertional chest pressure/discomfort and fatigue Integument/breast: positive for rash Hematologic/lymphatic: positive for no bruising seen Musculoskeletal: positive for arthralgias headache noted from nitro drip All other systems reviewed and reported as negative.   Physical Exam: Blood pressure 147/97, pulse 72, temperature 98.9 F (37.2 C), temperature source Oral, resp. rate 21, height 6' (1.829 m), weight 69.4 kg (153 lb), SpO2 98.00%. BP 147/97  Pulse 72  Temp(Src) 98.9 F (37.2 C) (Oral)  Resp 21  Ht 6' (1.829 m)  Wt 69.4 kg (153 lb)  BMI 20.75 kg/m2  SpO2 98%  General Appearance:    Alert, cooperative, no distress, appears stated age  Head:    Normocephalic, without obvious abnormality, atraumatic  Eyes:    Muddy sclera, PERRL, conjunctiva/corneas clear, EOM's intact, fundi  benign, both eyes, no papilledema     Nose:   Nares normal, septum midline, mucosa normal, no drainage    or sinus tenderness  Throat:   Lips, mucosa dry, and tongue normal; teeth and gums normal  Neck:   Supple, symmetrical, trachea midline, no adenopathy;       thyroid:  No enlargement/tenderness/nodules; no carotid   bruit or JVD     Lungs:     BBS with scattered crackles bilateral bases  Chest wall:    No tenderness or deformity  Heart:    Regular rate and rhythm, S1 and S2 normal, 2/6 systolic murmur heard  Abdomen:     Soft, non-tender, bowel sounds active all four quadrants,    no masses, no organomegaly        Extremities:   Trace pretibial edema  Pulses:   2+ and symmetric all extremities  Skin:   Skin color, texture, turgor normal, no rashes or lesions     Neurologic:   CNII-XII intact. Normal strength, sensation and reflexes      throughout    Lab  And Imaging results:  Results for orders placed during the  hospital encounter of 05/22/11 (from the past 24 hour(s))  ETHANOL     Status: Normal   Collection Time   05/22/11  4:53 AM      Component Value Range   Alcohol, Ethyl (B) <11  0 - 11 (mg/dL)  CBC     Status: Abnormal   Collection Time   05/22/11  4:53 AM      Component Value Range   WBC 5.0  4.0 - 10.5 (K/uL)   RBC 3.99 (*) 4.22 - 5.81 (MIL/uL)   Hemoglobin 13.2  13.0 - 17.0 (g/dL)   HCT 38.0 (*) 39.0 - 52.0 (%)   MCV 95.2  78.0 - 100.0 (fL)   MCH 33.1  26.0 - 34.0 (pg)   MCHC 34.7  30.0 - 36.0 (g/dL)   RDW 13.2  11.5 - 15.5 (%)   Platelets 119 (*) 150 -  400 (K/uL)  DIFFERENTIAL     Status: Normal   Collection Time   05/22/11  4:53 AM      Component Value Range   Neutrophils Relative 68  43 - 77 (%)   Neutro Abs 3.4  1.7 - 7.7 (K/uL)   Lymphocytes Relative 21  12 - 46 (%)   Lymphs Abs 1.0  0.7 - 4.0 (K/uL)   Monocytes Relative 10  3 - 12 (%)   Monocytes Absolute 0.5  0.1 - 1.0 (K/uL)   Eosinophils Relative 1  0 - 5 (%)   Eosinophils Absolute 0.1  0.0 - 0.7 (K/uL)   Basophils Relative 1  0 - 1 (%)   Basophils Absolute 0.0  0.0 - 0.1 (K/uL)  COMPREHENSIVE METABOLIC PANEL     Status: Abnormal   Collection Time   05/22/11  4:53 AM      Component Value Range   Sodium 140  135 - 145 (mEq/L)   Potassium 3.3 (*) 3.5 - 5.1 (mEq/L)   Chloride 106  96 - 112 (mEq/L)   CO2 23  19 - 32 (mEq/L)   Glucose, Bld 97  70 - 99 (mg/dL)   BUN 12  6 - 23 (mg/dL)   Creatinine, Ser 1.57 (*) 0.50 - 1.35 (mg/dL)   Calcium 8.8  8.4 - 10.5 (mg/dL)   Total Protein 6.2  6.0 - 8.3 (g/dL)   Albumin 3.1 (*) 3.5 - 5.2 (g/dL)   AST 23  0 - 37 (U/L)   ALT 22  0 - 53 (U/L)   Alkaline Phosphatase 89  39 - 117 (U/L)   Total Bilirubin 0.7  0.3 - 1.2 (mg/dL)   GFR calc non Af Amer 54 (*) >90 (mL/min)   GFR calc Af Amer 63 (*) >90 (mL/min)  LIPASE, BLOOD     Status: Normal   Collection Time   05/22/11  4:53 AM      Component Value Range   Lipase 16  11 - 59 (U/L)  PRO B NATRIURETIC PEPTIDE     Status: Abnormal     Collection Time   05/22/11  6:09 AM      Component Value Range   Pro B Natriuretic peptide (BNP) 7080.0 (*) 0 - 125 (pg/mL)  URINALYSIS, ROUTINE W REFLEX MICROSCOPIC     Status: Abnormal   Collection Time   05/22/11  6:30 AM      Component Value Range   Color, Urine YELLOW  YELLOW    APPearance CLEAR  CLEAR    Specific Gravity, Urine 1.022  1.005 - 1.030    pH 6.0  5.0 - 8.0    Glucose, UA NEGATIVE  NEGATIVE (mg/dL)   Hgb urine dipstick TRACE (*) NEGATIVE    Bilirubin Urine NEGATIVE  NEGATIVE    Ketones, ur NEGATIVE  NEGATIVE (mg/dL)   Protein, ur >300 (*) NEGATIVE (mg/dL)   Urobilinogen, UA 0.2  0.0 - 1.0 (mg/dL)   Nitrite NEGATIVE  NEGATIVE    Leukocytes, UA NEGATIVE  NEGATIVE   URINE RAPID DRUG SCREEN (HOSP PERFORMED)     Status: Abnormal   Collection Time   05/22/11  6:30 AM      Component Value Range   Opiates NONE DETECTED  NONE DETECTED    Cocaine NONE DETECTED  NONE DETECTED    Benzodiazepines POSITIVE (*) NONE DETECTED    Amphetamines NONE DETECTED  NONE DETECTED    Tetrahydrocannabinol POSITIVE (*) NONE DETECTED    Barbiturates NONE DETECTED  NONE  DETECTED   URINE MICROSCOPIC-ADD ON     Status: Normal   Collection Time   05/22/11  6:30 AM      Component Value Range   Squamous Epithelial / LPF RARE  RARE    RBC / HPF 0-2  <3 (RBC/hpf)   Bacteria, UA RARE  RARE   POCT I-STAT TROPONIN I     Status: Normal   Collection Time   05/22/11 12:34 PM      Component Value Range   Troponin i, poc 0.04  0.00 - 0.08 (ng/mL)   Comment 3            EKG Results:  Orders placed in visit on 05/22/11  . EKG 12-LEAD     Impression  Hypertensive emergency CHF (congestive heart failure) CRI (chronic renal insufficiency) HYPERLIPIDEMIA  Headache  Cardiomegaly - hypertensive  Chest pain  SOB (shortness of breath)  Tobacco abuse  Marijuana use  Abdominal pain  Hypertension, malignant  Anxiety and depression  URI - coughing up yellow sputum / abnormal CXR   Plan  The patient  is going to be admitted into the ICU for IV labetalol.  I'm going to start weaning down the IV nitroglycerin now that the patient's blood pressure is controlled and he is having significant headache side effects at this time.  We'll start morphine for pain and cycle cardiac enzymes to rule out myocardial injury.  I will schedule for him to have furosemide IV to diuresis and monitor his chest x-ray closely by having a repeat chest x-ray performed in the morning.  We'll titrate the labetalol to keep his systolic blood pressure less than 150.  Consult cardiology for evaluation and management recommendations regarding his congestive heart failure and chest pain.  We'll check an echocardiogram to evaluate pump function.  Smoking cessation consultation recommended.  Follow fasting lipids.  Monitor electrolytes closely with serial BMP tests.  I have updated the patient's and his wife regarding her plan of care and will continue to follow him closely.  Please see orders.  Please see consultation notes and labs and x-ray studies. Start Rocephin IV to treat URI symptoms and chest infection.   57 mins spent in direct critical care for this patient.    Erma, [place] 05/22/2011, 4:37 PM

## 2011-05-22 NOTE — ED Notes (Signed)
[id number] Ready

## 2011-05-22 NOTE — ED Notes (Signed)
Increased pts nitro gtt to 63mcg/min per orders

## 2011-05-23 ENCOUNTER — Inpatient Hospital Stay (HOSPITAL_COMMUNITY): Payer: Medicare Other

## 2011-05-23 DIAGNOSIS — I10 Essential (primary) hypertension: Secondary | ICD-10-CM | POA: Diagnosis not present

## 2011-05-23 DIAGNOSIS — I517 Cardiomegaly: Secondary | ICD-10-CM | POA: Diagnosis not present

## 2011-05-23 DIAGNOSIS — I509 Heart failure, unspecified: Secondary | ICD-10-CM | POA: Diagnosis not present

## 2011-05-23 DIAGNOSIS — D696 Thrombocytopenia, unspecified: Secondary | ICD-10-CM | POA: Diagnosis not present

## 2011-05-23 DIAGNOSIS — R918 Other nonspecific abnormal finding of lung field: Secondary | ICD-10-CM | POA: Diagnosis not present

## 2011-05-23 DIAGNOSIS — N189 Chronic kidney disease, unspecified: Secondary | ICD-10-CM | POA: Diagnosis not present

## 2011-05-23 DIAGNOSIS — J90 Pleural effusion, not elsewhere classified: Secondary | ICD-10-CM | POA: Diagnosis not present

## 2011-05-23 DIAGNOSIS — I359 Nonrheumatic aortic valve disorder, unspecified: Secondary | ICD-10-CM

## 2011-05-23 DIAGNOSIS — J42 Unspecified chronic bronchitis: Secondary | ICD-10-CM | POA: Diagnosis present

## 2011-05-23 LAB — CARDIAC PANEL(CRET KIN+CKTOT+MB+TROPI)
CK, MB: 1.5 ng/mL (ref 0.3–4.0)
Relative Index: 0.9 (ref 0.0–2.5)
Total CK: 139 U/L (ref 7–232)

## 2011-05-23 LAB — LIPID PANEL
Cholesterol: 151 mg/dL (ref 0–200)
LDL Cholesterol: 94 mg/dL (ref 0–99)
Total CHOL/HDL Ratio: 4 RATIO
VLDL: 19 mg/dL (ref 0–40)

## 2011-05-23 LAB — COMPREHENSIVE METABOLIC PANEL
Alkaline Phosphatase: 73 U/L (ref 39–117)
BUN: 11 mg/dL (ref 6–23)
Calcium: 8.7 mg/dL (ref 8.4–10.5)
Creatinine, Ser: 1.9 mg/dL — ABNORMAL HIGH (ref 0.50–1.35)
GFR calc Af Amer: 50 mL/min — ABNORMAL LOW (ref >90)
Glucose, Bld: 92 mg/dL (ref 70–99)
Potassium: 3.6 mEq/L (ref 3.5–5.1)
Total Protein: 5.2 g/dL — ABNORMAL LOW (ref 6.0–8.3)

## 2011-05-23 LAB — CBC
HCT: 33.8 % — ABNORMAL LOW (ref 39.0–52.0)
RBC: 3.57 MIL/uL — ABNORMAL LOW (ref 4.22–5.81)
RDW: 12.9 % (ref 11.5–15.5)
WBC: 3.6 10*3/uL — ABNORMAL LOW (ref 4.0–10.5)

## 2011-05-23 LAB — PROTIME-INR: INR: 1.18 (ref 0.00–1.49)

## 2011-05-23 LAB — PRO B NATRIURETIC PEPTIDE: Pro B Natriuretic peptide (BNP): 2737 pg/mL — ABNORMAL HIGH (ref 0–125)

## 2011-05-23 MED ORDER — LABETALOL HCL 200 MG PO TABS
200.0000 mg | ORAL_TABLET | Freq: Two times a day (BID) | ORAL | Status: DC
  Administered 2011-05-23 – 2011-05-24 (×4): 200 mg via ORAL
  Filled 2011-05-23 (×6): qty 1

## 2011-05-23 MED ORDER — BISACODYL 10 MG RE SUPP: 10.0000 mg | Freq: Every day | RECTAL | Status: DC | PRN

## 2011-05-23 MED ORDER — POTASSIUM CHLORIDE CRYS ER 20 MEQ PO TBCR
20.0000 meq | EXTENDED_RELEASE_TABLET | Freq: Two times a day (BID) | ORAL | Status: AC
  Administered 2011-05-23 (×2): 20 meq via ORAL

## 2011-05-23 MED ORDER — DOXYCYCLINE HYCLATE 100 MG PO TABS
100.0000 mg | ORAL_TABLET | Freq: Two times a day (BID) | ORAL | Status: DC
  Administered 2011-05-23 – 2011-05-25 (×5): 100 mg via ORAL
  Filled 2011-05-23 (×6): qty 1

## 2011-05-23 MED ORDER — POLYETHYLENE GLYCOL 3350 17 G PO PACK
17.0000 g | PACK | Freq: Every day | ORAL | Status: DC | PRN
  Administered 2011-05-23 – 2011-05-25 (×2): 17 g via ORAL
  Filled 2011-05-23 (×2): qty 1

## 2011-05-23 MED ORDER — POLYETHYLENE GLYCOL 3350 17 G PO PACK: 17.0000 g | PACK | Freq: Every day | ORAL | Status: DC | PRN

## 2011-05-23 MED ORDER — MORPHINE SULFATE 2 MG/ML IJ SOLN: 1.0000 mg | INTRAMUSCULAR | Status: DC | PRN

## 2011-05-23 NOTE — Progress Notes (Signed)
Pt admitted from 2900.  Pt VS stable, meds given, pt informed about voiding and urinal and the 24 hour urine speciman.  Pt oriented to room and call light.  Will continue to monitor.

## 2011-05-23 NOTE — Progress Notes (Signed)
  Echocardiogram 2D Echocardiogram has been performed.  [name] L 05/23/2011, 9:14 AM

## 2011-05-23 NOTE — Progress Notes (Signed)
[facility] Inpatient Progress Note  05/23/2011  Subjective: The patient is reporting that he is feeling better.  He still has some productive cough with yellow sputum but overall CP has resolved, headache resolved and SOB is better now.    Objective:  Vital signs in last 24 hours: Filed Vitals:   05/23/11 0500 05/23/11 0600 05/23/11 0700 05/23/11 0800  BP: 120/82 108/74 115/77 120/80  Pulse: 66 66 67 66  Temp:      TempSrc:      Resp: 15 18 19 17   Height:      Weight: 71.1 kg (156 lb 12 oz)     SpO2: 96% 93% 97% 96%   Weight change: 1.7 kg (3 lb 12 oz)  Intake/Output Summary (Last 24 hours) at 05/23/11 [id number] Last data filed at 05/23/11 0759  Gross per 24 hour  Intake 821.61 ml  Output   1475 ml  Net -653.39 ml    Review of Systems As above otherwise reviewed and negative  Physical Exam General - awake, alert, NAD, calm, cooperative, cough occasional Head - NCAT Eyes - Muddy Sclera bilateral, no icterus Nose - Nares clear Throat - Moist Mucous Membranes Neck - supple, no JVD Lungs - BBS with rare exp crackles CV - normal s1, s2 sounds Abd- soft, nondistended, no masses palpated Ext -no pretibial edema today Neuro - nonfocal  Lab Results: Results for orders placed during the hospital encounter of 05/22/11 (from the past 24 hour(s))  POCT I-STAT TROPONIN I     Status: Normal   Collection Time   05/22/11 12:34 PM      Component Value Range   Troponin i, poc 0.04  0.00 - 0.08 (ng/mL)   Comment 3           MRSA PCR SCREENING     Status: Normal   Collection Time   05/22/11  4:30 PM      Component Value Range   MRSA by PCR NEGATIVE  NEGATIVE   CARDIAC PANEL(CRET KIN+CKTOT+MB+TROPI)     Status: Normal   Collection Time   05/22/11  5:19 PM      Component Value Range   Total CK 140  7 - 232 (U/L)   CK, MB 1.7  0.3 - 4.0 (ng/mL)   Troponin I <0.30  <0.30 (ng/mL)   Relative Index 1.2  0.0 - 2.5   CARDIAC PANEL(CRET KIN+CKTOT+MB+TROPI)     Status: Normal   Collection Time   05/23/11 12:32 AM      Component Value Range   Total CK 148  7 - 232 (U/L)   CK, MB 1.4  0.3 - 4.0 (ng/mL)   Troponin I <0.30  <0.30 (ng/mL)   Relative Index 0.9  0.0 - 2.5   COMPREHENSIVE METABOLIC PANEL     Status: Abnormal   Collection Time   05/23/11  5:00 AM      Component Value Range   Sodium 136  135 - 145 (mEq/L)   Potassium 3.6  3.5 - 5.1 (mEq/L)   Chloride 103  96 - 112 (mEq/L)   CO2 24  19 - 32 (mEq/L)   Glucose, Bld 92  70 - 99 (mg/dL)   BUN 11  6 - 23 (mg/dL)   Creatinine, Ser 1.90 (*) 0.50 - 1.35 (mg/dL)   Calcium 8.7  8.4 - 10.5 (mg/dL)   Total Protein 5.2 (*) 6.0 - 8.3 (g/dL)   Albumin 2.7 (*) 3.5 - 5.2 (g/dL)   AST 17  0 -  37 (U/L)   ALT 17  0 - 53 (U/L)   Alkaline Phosphatase 73  39 - 117 (U/L)   Total Bilirubin 0.3  0.3 - 1.2 (mg/dL)   GFR calc non Af Amer 43 (*) >90 (mL/min)   GFR calc Af Amer 50 (*) >90 (mL/min)  CBC     Status: Abnormal   Collection Time   05/23/11  5:00 AM      Component Value Range   WBC 3.6 (*) 4.0 - 10.5 (K/uL)   RBC 3.57 (*) 4.22 - 5.81 (MIL/uL)   Hemoglobin 11.7 (*) 13.0 - 17.0 (g/dL)   HCT 33.8 (*) 39.0 - 52.0 (%)   MCV 94.7  78.0 - 100.0 (fL)   MCH 32.8  26.0 - 34.0 (pg)   MCHC 34.6  30.0 - 36.0 (g/dL)   RDW 12.9  11.5 - 15.5 (%)   Platelets 98 (*) 150 - 400 (K/uL)  PROTIME-INR     Status: Abnormal   Collection Time   05/23/11  5:00 AM      Component Value Range   Prothrombin Time 15.3 (*) 11.6 - 15.2 (seconds)   INR 1.18  0.00 - 1.49   PRO B NATRIURETIC PEPTIDE     Status: Abnormal   Collection Time   05/23/11  5:00 AM      Component Value Range   Pro B Natriuretic peptide (BNP) 2737.0 (*) 0 - 125 (pg/mL)  LIPID PANEL     Status: Abnormal   Collection Time   05/23/11  5:00 AM      Component Value Range   Cholesterol 151  0 - 200 (mg/dL)   Triglycerides 93  <150 (mg/dL)   HDL 38 (*) >39 (mg/dL)   Total CHOL/HDL Ratio 4.0     VLDL 19  0 - 40 (mg/dL)   LDL Cholesterol 94  0 - 99 (mg/dL)    Micro  Results: Recent Results (from the past 240 hour(s))  MRSA PCR SCREENING     Status: Normal   Collection Time   05/22/11  4:30 PM      Component Value Range Status Comment   MRSA by PCR NEGATIVE  NEGATIVE  Final     Studies/Results: Ct Head Wo Contrast  05/22/2011  *RADIOLOGY REPORT*  Clinical Data: Uncontrolled hypertension.  CT HEAD WITHOUT CONTRAST  Technique:  Contiguous axial images were obtained from the base of the skull through the vertex without contrast.  Comparison: 09/09/2010.  Findings: A small right posterior scalp density is again demonstrated.  Minimal white matter low density in both frontal lobes is unchanged.  Normal size and position of the ventricles. No intracranial hemorrhage, mass lesion or CT evidence of acute infarction.  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  IMPRESSION:  1.  No acute abnormality. 2.  Stable minimal chronic small vessel white matter ischemic changes in both frontal lobes.  Original Report Authenticated By: Gerald Stabs, M.D.   Dg Abd Acute W/chest  05/22/2011  *RADIOLOGY REPORT*  Clinical Data: Abdominal pain, shortness of breath, worsening, history hypertension  ACUTE ABDOMEN SERIES (ABDOMEN 2 VIEW & CHEST 1 VIEW)  Comparison: Chest radiograph 05/04/2011  Findings: Enlargement of cardiac silhouette. Pulmonary vascular congestion. Tortuous aorta. Bilateral perihilar infiltrates extending to lung bases, question edema versus infection. Small left pleural effusion.  Numerous cardiac monitoring lines project over chest and abdomen. Nonobstructive bowel gas pattern. No bowel dilatation, bowel wall thickening, or  free intraperitoneal air. Bones unremarkable. No urinary tract calcification. Small pelvic phleboliths.  IMPRESSION: No acute abdominal findings. Enlargement of cardiac silhouette with pulmonary vascular congestion and diffuse bilateral pulmonary infiltrates, question edema versus  infection. Small left pleural effusion.  Original Report Authenticated By: Burnetta Sabin, M.D.    Medications:  Scheduled Meds:   . acetaminophen  1,000 mg Oral Once  . aspirin EC  81 mg Oral Daily  . cefTRIAXone (ROCEPHIN)  IV  1 g Intravenous Q24H  . cloNIDine  0.1 mg Oral Once  . furosemide  80 mg Intravenous Once  .  HYDROmorphone (DILAUDID) injection  1 mg Intravenous Once  . labetalol  200 mg Oral BID  . labetalol (NORMODYNE) infusion  1 mg/min Intravenous To Major  . labetalol (NORMODYNE) infusion  1 mg/min Intravenous To Major  . labetalol  20 mg Intravenous Once  . labetalol  40 mg Intravenous Once  . lisinopril  20 mg Oral Daily  .  morphine injection  2 mg Intravenous Once  . pantoprazole  40 mg Oral Q0600  . potassium chloride  20 mEq Oral BID  . sertraline  50 mg Oral Daily  . sodium chloride  3 mL Intravenous Q12H  . spironolactone  25 mg Oral Daily  . DISCONTD: furosemide  40 mg Intravenous Q12H  . DISCONTD: furosemide  40 mg Oral Daily  . DISCONTD: nitroGLYCERIN  5-200 mcg/min Intravenous Once  . DISCONTD: potassium chloride  20 mEq Oral BID  . DISCONTD: potassium chloride  40 mEq Oral BID   Continuous Infusions:   . DISCONTD: labetalol (NORMODYNE) infusion Stopped (05/23/11 0758)  . DISCONTD: niCARDipine    . DISCONTD: nitroGLYCERIN 20 mcg/min (05/22/11 0800)   PRN Meds:.sodium chloride, acetaminophen, albuterol, LORazepam, morphine injection, ondansetron (ZOFRAN) IV, ondansetron, sodium chloride, DISCONTD: nitroGLYCERIN, DISCONTD: nitroGLYCERIN  Assessment/Plan: Hypertensive emergency - BP much better controlled now, pt is off labetalol now, transfer to telemetry, continue oral BP meds and monitor.  Chronic Renal Insufficiency - monitoring closely, following lytes  Tobacco - encouraged cessation  URI/Chronic Bronchitis - IV rocephin, switch to oral doxycycline  Chest Pain - ruled out by serial cardiac enzymes  CHF - continue diuresis, ECHO pending,  will follow, appreciate cardiology care.   Headache - resolved, CT scan of head negative for acute.     LOS: 1 day   Derion Kreiter 05/23/2011, 8:07 AM  Critical Care Time Spent  - 40 minutes  Murlean Iba, MD, CDE, [address], [place]

## 2011-05-23 NOTE — Progress Notes (Signed)
Radiology called report on pt chest xray results.  Dr. Wynetta Emery notified.

## 2011-05-23 NOTE — Progress Notes (Signed)
Patient ID: [name], male   DOB: [birth year], 41 y.o.   MRN: [id number]    SUBJECTIVE: No further headache.  SBP in 110s this am on labetalol gtt 1 mg/hr.  Complains of pain in arm at IV site.     [name] acetaminophen  1,000 mg Oral Once  . aspirin EC  81 mg Oral Daily  . cefTRIAXone (ROCEPHIN)  IV  1 g Intravenous Q24H  . cloNIDine  0.1 mg Oral Once  . furosemide  80 mg Intravenous Once  .  HYDROmorphone (DILAUDID) injection  1 mg Intravenous Once  . labetalol  200 mg Oral BID  . labetalol (NORMODYNE) infusion  1 mg/min Intravenous To Major  . labetalol (NORMODYNE) infusion  1 mg/min Intravenous To Major  . labetalol  20 mg Intravenous Once  . labetalol  40 mg Intravenous Once  . lisinopril  20 mg Oral Daily  .  morphine injection  2 mg Intravenous Once  . pantoprazole  40 mg Oral Q0600  . potassium chloride  20 mEq Oral BID  . sertraline  50 mg Oral Daily  . sodium chloride  3 mL Intravenous Q12H  . spironolactone  25 mg Oral Daily  . DISCONTD: furosemide  40 mg Intravenous Q12H  . DISCONTD: furosemide  40 mg Oral Daily  . DISCONTD: nitroGLYCERIN  5-200 mcg/min Intravenous Once  . DISCONTD: potassium chloride  20 mEq Oral BID  . DISCONTD: potassium chloride  40 mEq Oral BID        Filed Vitals:   05/23/11 0200 05/23/11 0300 05/23/11 0400 05/23/11 0500  BP: 102/74 108/72 115/83   Pulse: 65 64 65   Temp:   98.1 F (36.7 C)   TempSrc:      Resp: 16 14 16    Height:      Weight:    156 lb 12 oz (71.1 kg)  SpO2: 96% 98% 94%     Intake/Output Summary (Last 24 hours) at 05/23/11 0800 Last data filed at 05/23/11 0759  Gross per 24 hour  Intake 821.61 ml  Output   1475 ml  Net -653.39 ml    LABS: Basic Metabolic Panel:  Basename 05/23/11 0500 05/22/11 0453  NA 136 140  K 3.6 3.3*  CL 103 106  CO2 24 23  GLUCOSE 92 97  BUN 11 12  CREATININE 1.90* 1.57*  CALCIUM 8.7 8.8  MG -- --  PHOS -- --   Liver Function Tests:  Basename 05/23/11 0500 05/22/11 0453    AST 17 23  ALT 17 22  ALKPHOS 73 89  BILITOT 0.3 0.7  PROT 5.2* 6.2  ALBUMIN 2.7* 3.1*    Basename 05/22/11 0453  LIPASE 16  AMYLASE --   CBC:  Basename 05/23/11 0500 05/22/11 0453  WBC 3.6* 5.0  NEUTROABS -- 3.4  HGB 11.7* 13.2  HCT 33.8* 38.0*  MCV 94.7 95.2  PLT 98* 119*   Cardiac Enzymes:  Basename 05/23/11 0032 05/22/11 1719  CKTOTAL 148 140  CKMB 1.4 1.7  CKMBINDEX -- --  TROPONINI <0.30 <0.30   BNP: No components found with this basename: POCBNP:3 D-Dimer: No results found for this basename: DDIMER:2 in the last 72 hours Hemoglobin A1C: No results found for this basename: HGBA1C in the last 72 hours Fasting Lipid Panel:  Basename 05/23/11 0500  CHOL 151  HDL 38*  LDLCALC 94  TRIG 93  CHOLHDL 4.0  LDLDIRECT --   Thyroid Function Tests: No results found for this basename: TSH,T4TOTAL,FREET3,T3FREE,THYROIDAB  in the last 72 hours Anemia Panel: No results found for this basename: VITAMINB12,FOLATE,FERRITIN,TIBC,IRON,RETICCTPCT in the last 72 hours  RADIOLOGY: Dg Chest 2 View  05/04/2011  *RADIOLOGY REPORT*  Clinical Data: Dyspnea.  Productive cough.  Smoker.  CHEST - 2 VIEW  Comparison: 03/31/2011.  Findings: Stable enlarged cardiac silhouette.  Clear lungs. Minimal central peribronchial thickening.  Unremarkable bones.  IMPRESSION:  1.  Minimal bronchitic changes. 2.  Stable cardiomegaly.  Original Report Authenticated By: Gerald Stabs, M.D.   Ct Head Wo Contrast  05/22/2011  *RADIOLOGY REPORT*  Clinical Data: Uncontrolled hypertension.  CT HEAD WITHOUT CONTRAST  Technique:  Contiguous axial images were obtained from the base of the skull through the vertex without contrast.  Comparison: 09/09/2010.  Findings: A small right posterior scalp density is again demonstrated.  Minimal white matter low density in both frontal lobes is unchanged.  Normal size and position of the ventricles. No intracranial hemorrhage, mass lesion or CT evidence of acute  infarction.  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  IMPRESSION:  1.  No acute abnormality. 2.  Stable minimal chronic small vessel white matter ischemic changes in both frontal lobes.  Original Report Authenticated By: Gerald Stabs, M.D.   Dg Abd Acute W/chest  05/22/2011  *RADIOLOGY REPORT*  Clinical Data: Abdominal pain, shortness of breath, worsening, history hypertension  ACUTE ABDOMEN SERIES (ABDOMEN 2 VIEW & CHEST 1 VIEW)  Comparison: Chest radiograph 05/04/2011  Findings: Enlargement of cardiac silhouette. Pulmonary vascular congestion. Tortuous aorta. Bilateral perihilar infiltrates extending to lung bases, question edema versus infection. Small left pleural effusion.  Numerous cardiac monitoring lines project over chest and abdomen. Nonobstructive bowel gas pattern. No bowel dilatation, bowel wall thickening, or free intraperitoneal air. Bones unremarkable. No urinary tract calcification. Small pelvic phleboliths.  IMPRESSION: No acute abdominal findings. Enlargement of cardiac silhouette with pulmonary vascular congestion and diffuse bilateral pulmonary infiltrates, question edema versus infection. Small left pleural effusion.  Original Report Authenticated By: Burnetta Sabin, M.D.    PHYSICAL EXAM General: NAD Neck: No JVD, no thyromegaly or thyroid nodule.  Lungs: Clear to auscultation bilaterally with normal respiratory effort. CV: Nondisplaced PMI.  Heart regular S1/S2, +S4, no murmur.  No peripheral edema.  No carotid bruit.  Normal pedal pulses.  Abdomen: Soft, nontender, no hepatosplenomegaly, no distention.  Neurologic: Alert and oriented x 3.  Psych: Normal affect. Extremities: No clubbing or cyanosis.   TELEMETRY: Reviewed telemetry pt in NSR  ASSESSMENT AND PLAN:  41 yo with history of HTN presented with hypertensive emergency with headache and probable acute diastolic CHF.  1. HTN: BP improved.  D/c  labetalol gtt today, change to po labetalol.  Will need to follow creatinine carefully, may need to back off on ACEI.  With creatinine rise, will stop Lasix. Continue spironolactone for now keeping eye on K and creatinine. 24 hour urine collection for pheochromocytoma.   2. CHF: Suspect acute diastolic CHF in setting of hypertensive urgency.  Would get echo.  Diuresed well yesterday.  Suspect this was flash pulmonary edema and now that BP is controlled, he is not going to need much further diuresis.  Discontinue Lasix with creatinine rise. 3. Renal: Acute/chronic renal insufficiency.  Creatinine up.  Holding Lasix, watch with ACEI.   [name] 05/23/2011 8:06 AM

## 2011-05-24 ENCOUNTER — Encounter (HOSPITAL_COMMUNITY): Payer: Self-pay | Admitting: Family Medicine

## 2011-05-24 DIAGNOSIS — D696 Thrombocytopenia, unspecified: Secondary | ICD-10-CM | POA: Diagnosis not present

## 2011-05-24 DIAGNOSIS — N189 Chronic kidney disease, unspecified: Secondary | ICD-10-CM | POA: Diagnosis not present

## 2011-05-24 DIAGNOSIS — I10 Essential (primary) hypertension: Secondary | ICD-10-CM | POA: Diagnosis not present

## 2011-05-24 DIAGNOSIS — I509 Heart failure, unspecified: Secondary | ICD-10-CM | POA: Diagnosis not present

## 2011-05-24 LAB — COMPREHENSIVE METABOLIC PANEL
AST: 20 U/L (ref 0–37)
Albumin: 2.7 g/dL — ABNORMAL LOW (ref 3.5–5.2)
Alkaline Phosphatase: 77 U/L (ref 39–117)
BUN: 10 mg/dL (ref 6–23)
Chloride: 106 mEq/L (ref 96–112)
Creatinine, Ser: 1.75 mg/dL — ABNORMAL HIGH (ref 0.50–1.35)
Potassium: 4.2 mEq/L (ref 3.5–5.1)
Total Bilirubin: 0.3 mg/dL (ref 0.3–1.2)
Total Protein: 5.2 g/dL — ABNORMAL LOW (ref 6.0–8.3)

## 2011-05-24 LAB — BASIC METABOLIC PANEL
BUN: 10 mg/dL (ref 6–23)
CO2: 25 mEq/L (ref 19–32)
Glucose, Bld: 90 mg/dL (ref 70–99)
Potassium: 4.4 mEq/L (ref 3.5–5.1)
Sodium: 138 mEq/L (ref 135–145)

## 2011-05-24 MED ORDER — HYDRALAZINE HCL 10 MG PO TABS
10.0000 mg | ORAL_TABLET | Freq: Four times a day (QID) | ORAL | Status: DC
  Administered 2011-05-24 – 2011-05-25 (×4): 10 mg via ORAL
  Filled 2011-05-24 (×8): qty 1

## 2011-05-24 MED ORDER — FUROSEMIDE 10 MG/ML IJ SOLN
40.0000 mg | Freq: Once | INTRAMUSCULAR | Status: AC
  Administered 2011-05-24: 40 mg via INTRAVENOUS
  Filled 2011-05-24: qty 4

## 2011-05-24 NOTE — Progress Notes (Signed)
Patient ID: [name], male   DOB: [birth year], 41 y.o.   MRN: [id number] Subjective:  No  Chest pain. Notes one episode of elevated blood pressure.  Objective:  Vital Signs in the last 24 hours: Temp:  [97.5 F (36.4 C)-98.4 F (36.9 C)] 98.4 F (36.9 C) (03/10 0711) Pulse Rate:  [70-78] 78  (03/10 0711) Resp:  [18-20] 20  (03/10 0711) BP: (132-163)/(94-109) 163/109 mmHg (03/10 0711) SpO2:  [98 %-100 %] 99 % (03/10 0711) Weight:  [70.534 kg (155 lb 8 oz)-70.7 kg (155 lb 13.8 oz)] 70.534 kg (155 lb 8 oz) (03/10 0347)  Intake/Output from previous day: 03/09 0701 - 03/10 0700 In: 1097.3 [P.O.:1020; I.V.:77.3] Out: 1775 [Urine:1775] Intake/Output from this shift: Total I/O In: -  Out: 150 [Urine:150]  Physical Exam: Well appearing NAD HEENT: Unremarkable Neck:  No JVD, no thyromegally Lungs:  Clear with no wheezes or rales. HEART:  Regular rate rhythm, no murmurs, no rubs, no clicks. Prominent S4.  Abd:  Flat, positive bowel sounds, no organomegally, no rebound, no guarding Ext:  2 plus pulses, no edema, no cyanosis, no clubbing Skin:  No rashes no nodules Neuro:  CN II through XII intact, motor grossly intact  Lab Results:  Basename 05/23/11 0500 05/22/11 0453  WBC 3.6* 5.0  HGB 11.7* 13.2  PLT 98* 119*    Basename 05/24/11 0544 05/23/11 0500  NA 137 136  K 4.2 3.6  CL 106 103  CO2 25 24  GLUCOSE 85 92  BUN 10 11  CREATININE 1.75* 1.90*    Basename 05/23/11 1014 05/23/11 0032  TROPONINI <0.30 <0.30   Hepatic Function Panel  Basename 05/24/11 0544  PROT 5.2*  ALBUMIN 2.7*  AST 20  ALT 20  ALKPHOS 77  BILITOT 0.3  BILIDIR --  IBILI --    Basename 05/23/11 0500  CHOL 151   No results found for this basename: PROTIME in the last 72 hours  Imaging: X-ray Chest Pa And Lateral   05/23/2011  *RADIOLOGY REPORT*  Clinical Data: Chest pain and shortness of breath.  History of infection.  CHEST - 2 VIEW  Comparison: Chest x-ray 05/04/2011.  Findings:  Compared to the recent prior examination, there has been interval development of a small-moderate right-sided pleural effusion.  Opacities in the left lower lobe are concerning for airspace consolidation (either from aspiration or infection). Linear opacities in the medial aspect of the left lung base likely represent areas of subsegmental atelectasis, although early endobronchial spread of infection is not excluded).  No definite left-sided pleural effusion.  Pulmonary vasculature is normal. Mild cardiomegaly with prominent left ventricular contour (which suggests left ventricular hypertrophy), unchanged. The patient is rotated to the left on today's exam, resulting in distortion of the mediastinal contours and reduced diagnostic sensitivity and specificity for mediastinal pathology.  IMPRESSION: 1.  Findings concerning for right lower lobe pneumonia with a small- moderate right-sided parapneumonic pleural effusion. 2. Linear opacities in the medial aspect of the left lower lobe are favored to represent areas of subsegmental atelectasis (less likely early endobronchial spread of infection). 3.  Mild cardiomegaly with evidence of left ventricular hypertrophy.  This was made a call report.  Original Report Authenticated By: Etheleen Mayhew, M.D.   Ct Head Wo Contrast  05/22/2011  *RADIOLOGY REPORT*  Clinical Data: Uncontrolled hypertension.  CT HEAD WITHOUT CONTRAST  Technique:  Contiguous axial images were obtained from the base of the skull through the vertex without contrast.  Comparison: 09/09/2010.  Findings:  A small right posterior scalp density is again demonstrated.  Minimal white matter low density in both frontal lobes is unchanged.  Normal size and position of the ventricles. No intracranial hemorrhage, mass lesion or CT evidence of acute infarction.  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  IMPRESSION:  1.  No acute abnormality. 2.   Stable minimal chronic small vessel white matter ischemic changes in both frontal lobes.  Original Report Authenticated By: Gerald Stabs, M.D.    Cardiac Studies: Tele - nsr Assessment/Plan:  1. HTN urgency - stable at present. Will continue current meds.  2. Acute Diastolic CHF - he is nearly back to baseline. His creatinine has come down and I will give a little additional lasix.  3. Renal insufficiency - I suspect his baseline creatinine will be closer to 2. Will follow.  LOS: 2 days    Cristopher Peru 05/24/2011, 9:54 AM

## 2011-05-24 NOTE — Progress Notes (Signed)
Patient bp elevated with qhs vital check.  Patient is alert, oriented, and asymptomatic.  Will give scheduled po bp meds and recheck bp and continue to monitor.  awalker,rn

## 2011-05-24 NOTE — Progress Notes (Signed)
[facility] Inpatient Progress Note  05/24/2011  Subjective: Pt reports that he is still having some chest congestion but overall doing better.  No chest pain, no SOB, no edema.   Objective:  Vital signs in last 24 hours: Filed Vitals:   05/23/11 1452 05/23/11 2226 05/24/11 0347 05/24/11 0711  BP: 141/99 153/100 157/107 163/109  Pulse: 72 75 75 78  Temp: 97.5 F (36.4 C)  98.3 F (36.8 C) 98.4 F (36.9 C)  TempSrc: Oral  Oral Oral  Resp: 19 20 20 20   Height:      Weight:   70.534 kg (155 lb 8 oz)   SpO2: 100% 100% 98% 99%   Weight change: -0.4 kg (-14.1 oz)  Intake/Output Summary (Last 24 hours) at 05/24/11 1018 Last data filed at 05/24/11 0713  Gross per 24 hour  Intake    843 ml  Output   1825 ml  Net   -982 ml    Review of Systems As above otherwise all reviewed and reported negative  Physical Exam Well appearing NAD, sitting up in bed HEENT: NCAT, MMM, sclera clear today Neck: No JVD, no thyromegaly, trachea midline Lungs: Clear with no wheezes or rales.  HEART: Regular rate rhythm, no murmurs, no rubs, no clicks.  Abd: Flat, positive bowel sounds, no organomegaly, no rebound, no guarding  Ext: 2 plus pulses, no edema, no cyanosis, no clubbing  Skin: No rashes no nodules  Neuro: CN II through XII intact, motor grossly intact   Lab Results: Results for orders placed during the hospital encounter of 05/22/11 (from the past 24 hour(s))  COMPREHENSIVE METABOLIC PANEL     Status: Abnormal   Collection Time   05/24/11  5:44 AM      Component Value Range   Sodium 137  135 - 145 (mEq/L)   Potassium 4.2  3.5 - 5.1 (mEq/L)   Chloride 106  96 - 112 (mEq/L)   CO2 25  19 - 32 (mEq/L)   Glucose, Bld 85  70 - 99 (mg/dL)   BUN 10  6 - 23 (mg/dL)   Creatinine, Ser 1.75 (*) 0.50 - 1.35 (mg/dL)   Calcium 8.6  8.4 - 10.5 (mg/dL)   Total Protein 5.2 (*) 6.0 - 8.3 (g/dL)   Albumin 2.7 (*) 3.5 - 5.2 (g/dL)   AST 20  0 - 37 (U/L)   ALT 20  0 - 53 (U/L)   Alkaline  Phosphatase 77  39 - 117 (U/L)   Total Bilirubin 0.3  0.3 - 1.2 (mg/dL)   GFR calc non Af Amer 48 (*) >90 (mL/min)   GFR calc Af Amer 55 (*) >90 (mL/min)   Micro Results: Recent Results (from the past 240 hour(s))  MRSA PCR SCREENING     Status: Normal   Collection Time   05/22/11  4:30 PM      Component Value Range Status Comment   MRSA by PCR NEGATIVE  NEGATIVE  Final     Studies/Results: X-ray Chest Pa And Lateral   05/23/2011  *RADIOLOGY REPORT*  Clinical Data: Chest pain and shortness of breath.  History of infection.  CHEST - 2 VIEW  Comparison: Chest x-ray 05/04/2011.  Findings: Compared to the recent prior examination, there has been interval development of a small-moderate right-sided pleural effusion.  Opacities in the left lower lobe are concerning for airspace consolidation (either from aspiration or infection). Linear opacities in the medial aspect of the left lung base likely represent areas of subsegmental atelectasis, although  early endobronchial spread of infection is not excluded).  No definite left-sided pleural effusion.  Pulmonary vasculature is normal. Mild cardiomegaly with prominent left ventricular contour (which suggests left ventricular hypertrophy), unchanged. The patient is rotated to the left on today's exam, resulting in distortion of the mediastinal contours and reduced diagnostic sensitivity and specificity for mediastinal pathology.  IMPRESSION: 1.  Findings concerning for right lower lobe pneumonia with a small- moderate right-sided parapneumonic pleural effusion. 2. Linear opacities in the medial aspect of the left lower lobe are favored to represent areas of subsegmental atelectasis (less likely early endobronchial spread of infection). 3.  Mild cardiomegaly with evidence of left ventricular hypertrophy.  This was made a call report.  Original Report Authenticated By: Etheleen Mayhew, M.D.   Ct Head Wo Contrast  05/22/2011  *RADIOLOGY REPORT*  Clinical Data:  Uncontrolled hypertension.  CT HEAD WITHOUT CONTRAST  Technique:  Contiguous axial images were obtained from the base of the skull through the vertex without contrast.  Comparison: 09/09/2010.  Findings: A small right posterior scalp density is again demonstrated.  Minimal white matter low density in both frontal lobes is unchanged.  Normal size and position of the ventricles. No intracranial hemorrhage, mass lesion or CT evidence of acute infarction.  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  IMPRESSION:  1.  No acute abnormality. 2.  Stable minimal chronic small vessel white matter ischemic changes in both frontal lobes.  Original Report Authenticated By: Gerald Stabs, M.D.    Medications:  Scheduled Meds:   . aspirin EC  81 mg Oral Daily  . doxycycline  100 mg Oral Q12H  . furosemide  40 mg Intravenous Once  . labetalol  200 mg Oral BID  . lisinopril  20 mg Oral Daily  . pantoprazole  40 mg Oral Q0600  . potassium chloride  20 mEq Oral BID  . sertraline  50 mg Oral Daily  . sodium chloride  3 mL Intravenous Q12H  . spironolactone  25 mg Oral Daily  . DISCONTD: cefTRIAXone (ROCEPHIN)  IV  1 g Intravenous Q24H   Continuous Infusions:  PRN Meds:.sodium chloride, acetaminophen, albuterol, bisacodyl, LORazepam, morphine injection, ondansetron (ZOFRAN) IV, ondansetron, polyethylene glycol, sodium chloride, DISCONTD: bisacodyl, DISCONTD:  morphine injection, DISCONTD: polyethylene glycol  Assessment/Plan: Hypertensive emergency - pt's BPs much better controlled.  Malignant Hypertension - continue oral labetalol, lisinopril, spironolactone, add low dose hydralazine and monitor.  Chronic Renal Insufficiency - monitoring closely, following lytes, creatinine stable on lisinopril Tobacco - encouraged cessation  Pneumonia - continue oral doxycycline twice daily Chest Pain - ruled out by serial cardiac enzymes  CHF - continue  diuresis, ECHO reviewed, EF 35%, appreciate excellent cardiology care.  Headache - resolved, CT scan of head negative for acute.  Plan for home tomorrow    LOS: 2 days   Maraki Macquarrie 05/24/2011, 10:18 AM   Murlean Iba, MD, CDE, FAAFP [facility] [place], [place]

## 2011-05-25 DIAGNOSIS — I10 Essential (primary) hypertension: Secondary | ICD-10-CM | POA: Diagnosis not present

## 2011-05-25 DIAGNOSIS — I509 Heart failure, unspecified: Secondary | ICD-10-CM | POA: Diagnosis not present

## 2011-05-25 DIAGNOSIS — D696 Thrombocytopenia, unspecified: Secondary | ICD-10-CM | POA: Diagnosis not present

## 2011-05-25 DIAGNOSIS — J189 Pneumonia, unspecified organism: Secondary | ICD-10-CM | POA: Diagnosis present

## 2011-05-25 DIAGNOSIS — N189 Chronic kidney disease, unspecified: Secondary | ICD-10-CM | POA: Diagnosis not present

## 2011-05-25 LAB — BASIC METABOLIC PANEL
BUN: 12 mg/dL (ref 6–23)
CO2: 27 mEq/L (ref 19–32)
Calcium: 8.9 mg/dL (ref 8.4–10.5)
Chloride: 104 mEq/L (ref 96–112)
Creatinine, Ser: 1.68 mg/dL — ABNORMAL HIGH (ref 0.50–1.35)
Glucose, Bld: 92 mg/dL (ref 70–99)

## 2011-05-25 MED ORDER — ISOSORBIDE MONONITRATE ER 30 MG PO TB24
30.0000 mg | ORAL_TABLET | Freq: Every day | ORAL | Status: DC
  Administered 2011-05-25: 30 mg via ORAL
  Filled 2011-05-25: qty 1

## 2011-05-25 MED ORDER — LISINOPRIL 20 MG PO TABS: 20.0000 mg | ORAL_TABLET | Freq: Every day | ORAL | Status: DC

## 2011-05-25 MED ORDER — HYDRALAZINE HCL 50 MG PO TABS
50.0000 mg | ORAL_TABLET | Freq: Three times a day (TID) | ORAL | Status: DC
  Administered 2011-05-25: 50 mg via ORAL
  Filled 2011-05-25 (×3): qty 1

## 2011-05-25 MED ORDER — ASPIRIN 81 MG PO TBEC: 81.0000 mg | DELAYED_RELEASE_TABLET | Freq: Every day | ORAL | Status: DC

## 2011-05-25 MED ORDER — HYDRALAZINE HCL 25 MG PO TABS: 50.0000 mg | ORAL_TABLET | Freq: Three times a day (TID) | ORAL | Status: DC

## 2011-05-25 MED ORDER — HYDRALAZINE HCL 25 MG PO TABS: 25.0000 mg | ORAL_TABLET | Freq: Four times a day (QID) | ORAL | Status: DC

## 2011-05-25 MED ORDER — SPIRONOLACTONE 25 MG PO TABS: 25.0000 mg | ORAL_TABLET | Freq: Every day | ORAL | Status: DC

## 2011-05-25 MED ORDER — FUROSEMIDE 20 MG PO TABS: 20.0000 mg | ORAL_TABLET | Freq: Every day | ORAL | Status: DC

## 2011-05-25 MED ORDER — HYDRALAZINE HCL 20 MG/ML IJ SOLN
5.0000 mg | Freq: Once | INTRAMUSCULAR | Status: AC
  Administered 2011-05-25: 06:00:00 via INTRAVENOUS
  Filled 2011-05-25 (×2): qty 0.25

## 2011-05-25 MED ORDER — ISOSORBIDE MONONITRATE ER 30 MG PO TB24: 30.0000 mg | ORAL_TABLET | Freq: Every day | ORAL | Status: DC

## 2011-05-25 MED ORDER — CARVEDILOL 12.5 MG PO TABS: 12.5000 mg | ORAL_TABLET | Freq: Two times a day (BID) | ORAL | Status: DC

## 2011-05-25 MED ORDER — CARVEDILOL 12.5 MG PO TABS
12.5000 mg | ORAL_TABLET | Freq: Two times a day (BID) | ORAL | Status: DC
  Administered 2011-05-25: 12.5 mg via ORAL
  Filled 2011-05-25 (×3): qty 1

## 2011-05-25 MED ORDER — DOXYCYCLINE HYCLATE 100 MG PO TABS: 100.0000 mg | ORAL_TABLET | Freq: Two times a day (BID) | ORAL | Status: AC

## 2011-05-25 MED ORDER — LABETALOL HCL 200 MG PO TABS: 200.0000 mg | ORAL_TABLET | Freq: Two times a day (BID) | ORAL | Status: DC

## 2011-05-25 NOTE — Progress Notes (Addendum)
Patient ID: [name], male   DOB: June 30, 1970, 41 y.o.   MRN: [id number]     SUBJECTIVE: BP still running quite high today with diastolic up to [id number].  No dyspnea or chest pain.      [name] aspirin EC  81 mg Oral Daily  . doxycycline  100 mg Oral Q12H  . furosemide  40 mg Intravenous Once  . hydrALAZINE  5 mg Intravenous Once  . hydrALAZINE  50 mg Oral Q8H  . isosorbide mononitrate  30 mg Oral Daily  . lisinopril  20 mg Oral Daily  . pantoprazole  40 mg Oral Q0600  . sertraline  50 mg Oral Daily  . sodium chloride  3 mL Intravenous Q12H  . spironolactone  25 mg Oral Daily  . DISCONTD: hydrALAZINE  10 mg Oral Q6H  . DISCONTD: hydrALAZINE  25 mg Oral Q6H  . DISCONTD: labetalol  200 mg Oral BID        Filed Vitals:   05/25/11 0400 05/25/11 0405 05/25/11 0604 05/25/11 0629  BP: 159/116 157/104 159/116 164/110  Pulse: 72 63    Temp:      TempSrc:      Resp:      Height:      Weight:    149 lb 11.1 oz (67.9 kg)  SpO2:        Intake/Output Summary (Last 24 hours) at 05/25/11 0819 Last data filed at 05/25/11 0728  Gross per 24 hour  Intake   1200 ml  Output   2425 ml  Net  -1225 ml    LABS: Basic Metabolic Panel:  Basename 05/25/11 0505 05/24/11 1049  NA 139 138  K 3.8 4.4  CL 104 106  CO2 27 25  GLUCOSE 92 90  BUN 12 10  CREATININE 1.68* 1.68*  CALCIUM 8.9 9.3  MG -- --  PHOS -- --   Liver Function Tests:  Basename 05/24/11 0544 05/23/11 0500  AST 20 17  ALT 20 17  ALKPHOS 77 73  BILITOT 0.3 0.3  PROT 5.2* 5.2*  ALBUMIN 2.7* 2.7*   No results found for this basename: LIPASE:2,AMYLASE:2 in the last 72 hours CBC:  Basename 05/23/11 0500  WBC 3.6*  NEUTROABS --  HGB 11.7*  HCT 33.8*  MCV 94.7  PLT 98*   Cardiac Enzymes:  Basename 05/23/11 1014 05/23/11 0032 05/22/11 1719  CKTOTAL 139 148 140  CKMB 1.5 1.4 1.7  CKMBINDEX -- -- --  TROPONINI <0.30 <0.30 <0.30   BNP: No components found with this basename: POCBNP:3 D-Dimer: No results found  for this basename: DDIMER:2 in the last 72 hours Hemoglobin A1C: No results found for this basename: HGBA1C in the last 72 hours Fasting Lipid Panel:  Basename 05/23/11 0500  CHOL 151  HDL 38*  LDLCALC 94  TRIG 93  CHOLHDL 4.0  LDLDIRECT --   Thyroid Function Tests: No results found for this basename: TSH,T4TOTAL,FREET3,T3FREE,THYROIDAB in the last 72 hours Anemia Panel: No results found for this basename: VITAMINB12,FOLATE,FERRITIN,TIBC,IRON,RETICCTPCT in the last 72 hours  RADIOLOGY: Dg Chest 2 View  05/04/2011  *RADIOLOGY REPORT*  Clinical Data: Dyspnea.  Productive cough.  Smoker.  CHEST - 2 VIEW  Comparison: 03/31/2011.  Findings: Stable enlarged cardiac silhouette.  Clear lungs. Minimal central peribronchial thickening.  Unremarkable bones.  IMPRESSION:  1.  Minimal bronchitic changes. 2.  Stable cardiomegaly.  Original Report Authenticated By: Gerald Stabs, M.D.   Ct Head Wo Contrast  05/22/2011  *RADIOLOGY REPORT*  Clinical  Data: Uncontrolled hypertension.  CT HEAD WITHOUT CONTRAST  Technique:  Contiguous axial images were obtained from the base of the skull through the vertex without contrast.  Comparison: 09/09/2010.  Findings: A small right posterior scalp density is again demonstrated.  Minimal white matter low density in both frontal lobes is unchanged.  Normal size and position of the ventricles. No intracranial hemorrhage, mass lesion or CT evidence of acute infarction.  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  IMPRESSION:  1.  No acute abnormality. 2.  Stable minimal chronic small vessel white matter ischemic changes in both frontal lobes.  Original Report Authenticated By: Gerald Stabs, M.D.   Dg Abd Acute W/chest  05/22/2011  *RADIOLOGY REPORT*  Clinical Data: Abdominal pain, shortness of breath, worsening, history hypertension  ACUTE ABDOMEN SERIES (ABDOMEN 2 VIEW & CHEST 1 VIEW)  Comparison: Chest  radiograph 05/04/2011  Findings: Enlargement of cardiac silhouette. Pulmonary vascular congestion. Tortuous aorta. Bilateral perihilar infiltrates extending to lung bases, question edema versus infection. Small left pleural effusion.  Numerous cardiac monitoring lines project over chest and abdomen. Nonobstructive bowel gas pattern. No bowel dilatation, bowel wall thickening, or free intraperitoneal air. Bones unremarkable. No urinary tract calcification. Small pelvic phleboliths.  IMPRESSION: No acute abdominal findings. Enlargement of cardiac silhouette with pulmonary vascular congestion and diffuse bilateral pulmonary infiltrates, question edema versus infection. Small left pleural effusion.  Original Report Authenticated By: Burnetta Sabin, M.D.    PHYSICAL EXAM General: NAD Neck: No JVD, no thyromegaly or thyroid nodule.  Lungs: Clear to auscultation bilaterally with normal respiratory effort. CV: Nondisplaced PMI.  Heart regular S1/S2, +S4, 2/6 diastolic murmur along sternal border.  No peripheral edema.  No carotid bruit.  Normal pedal pulses.  Abdomen: Soft, nontender, no hepatosplenomegaly, no distention.  Neurologic: Alert and oriented x 3.  Psych: Normal affect. Extremities: No clubbing or cyanosis.   TELEMETRY: Reviewed telemetry pt in NSR  ASSESSMENT AND PLAN:  41 yo with history of HTN presented with hypertensive emergency with headache and acute systolic CHF.  1. HTN: BP still high.  Change hydralazine to 50 every 8 hrs, change labetalol to Coreg 12.5 mg bid given decreased EF.  Titrate up on hydralazine over the day. 2. CHF: Admitted with acute systolic CHF in setting of hypertensive emergency.  With BP control, volume now better.  EF 35-40% by echo.  He has a history of heavy ETOH as well as uncontrolled HTN, either could have contributed to cardiomyopathy.  - Change labetalol to Coreg. - Increase hydralazine, add Imdur.  Can titrate hydralazine through the day today.  If needs  additional agent, add amlodipine rather than increasing ACEI with elevated creatinine.  - Continue spironolactone and lisinopril. 3. Renal: Creatinine stable, baseline CKD likely from HTN.  Continue current lisinopril and spironolactone.  4. AI/MR: Patient noted to have AI and MR on echo.  Would repeat echo down the road when BP is controlled to see if this improves.   [name] 05/25/2011 8:19 AM

## 2011-05-25 NOTE — Progress Notes (Signed)
Pt smokes 1 ppd and is very interested in quitting and is in action stage. Recommended 21 mg patch x 6 weeks, 14 mg patch x 2 weeks and 7 mg patch x 2 weeks. Discussed patch use instructions and wrote down how to taper off them. Referred to 1-800 quit now for f/u and support. Discussed oral fixation substitutes, second hand smoke and in home smoking policy. Reviewed and gave pt Written education/contact information.

## 2011-05-25 NOTE — Progress Notes (Signed)
IV d/c'ed. Tele d/c'ed. D/C instructions and medications discussed with pt and pt's family member. Also reviewed "Living better with heart failure" book with pt. Pt was able to state the need for daily wts, low sodium diet, smoking cessation, and medications pt will be taking.  All questions answered and pt states understanding. Discussed with MD and plan is to wait for d/c this afternoon.  Will continue to monitor pt while awaiting ok to d/c.

## 2011-05-25 NOTE — Progress Notes (Signed)
Spoke with Dr. Marigene Ehlers about pt's d/c plan.  BP trending down. Pt ok for d/c once follow-up appt made.

## 2011-05-25 NOTE — Progress Notes (Signed)
PATIENT BP ELEVATED THIS AM, WAS 167/112-RECHECK IT WAS 157/116. PATIENT REMAINS ALERT & ASYMPTOMATIC. T.CALLAHAN NOTIFIED, RECEIVED ORDER FOR ONE TIME DOSE HYDRALAZINE. WILL GIVE AS ORDERED AND CONTINUE TO MONITOR.  Lorre Nick

## 2011-05-25 NOTE — Progress Notes (Signed)
Follow up appt made at [facility].  Smoking cessation RN also spoke with pt before d/c. Pt refused w/c for d/c. Pt does not appear in any immediate distress. Pt escorted out with staff. Pt d/c'ed to home.

## 2011-05-25 NOTE — Discharge Summary (Addendum)
DISCHARGE SUMMARY  [name]  MR#: [id number]  DOB:02-08-1971  Date of Admission: 05/22/2011 Date of Discharge: 05/25/2011  Attending Physician: Irwin Brakeman  Patient's PCP:  [place]  Consults: Treatment Team:  Jolaine Artist, MD [facility] CARDIOLOGY  Discharge Diagnoses: Present on Admission:  .Hypertensive emergency .CHF (congestive heart failure) .Cardiomegaly - hypertensive .Chest pain .SOB (shortness of breath) .Headache .Tobacco abuse .Marijuana use .Abdominal pain .Hypertension, malignant .HYPERLIPIDEMIA .Anxiety and depression .CRI (chronic renal insufficiency) .URI (upper respiratory infection) .Proteinuria .Acute diastolic heart failure .Chronic bronchitis .Pneumonia   Medication List  As of 05/25/2011  8:31 AM   START taking these medications         aspirin 81 MG EC tablet   Take 1 tablet (81 mg total) by mouth daily.      doxycycline 100 MG tablet   Commonly known as: VIBRA-TABS   Take 1 tablet (100 mg total) by mouth every 12 (twelve) hours.      hydrALAZINE 25 MG tablet   Commonly known as: APRESOLINE   Take 1 tablet (25 mg total) by mouth every 6 (six) hours.      Coreg 12.5 MG tablet   Commonly known as: NORMODYNE   Take 1 tablet  by mouth 2 (two) times daily.      spironolactone 25 MG tablet   Commonly known as: ALDACTONE   Take 1 tablet (25 mg total) by mouth daily.             imdur 30 mg - take 1 tablet by mouth once daily     CHANGE how you take these medications         lisinopril 20 MG tablet   Commonly known as: PRINIVIL,ZESTRIL   Take 1 tablet (20 mg total) by mouth daily.   What changed: dose         CONTINUE taking these medications         albuterol 108 (90 BASE) MCG/ACT inhaler   Commonly known as: PROVENTIL HFA;VENTOLIN HFA   Inhale 1-2 puffs into the lungs every 6 (six) hours as needed for wheezing.      ALPRAZolam 1 MG tablet   Commonly known as: XANAX      sertraline 50 MG tablet   Commonly known as: ZOLOFT         STOP taking these medications         benzonatate 100 MG capsule      nebivolol 10 MG tablet          Where to get your medications    These are the prescriptions that you need to pick up.   You may get these medications from any pharmacy.         aspirin 81 MG EC tablet   doxycycline 100 MG tablet   hydrALAZINE 25 MG tablet   labetalol 200 MG tablet   lisinopril 20 MG tablet   spironolactone 25 MG tablet             furosemide 20 mg tablet         Hospital Course: Present on Admission:  .Hypertensive emergency .CHF (congestive heart failure) .Cardiomegaly - hypertensive .Chest pain .SOB (shortness of breath) .Headache .Tobacco abuse .Marijuana use .Abdominal pain .Hypertension, malignant .HYPERLIPIDEMIA .Anxiety and depression .CRI (chronic renal insufficiency) .URI (upper respiratory infection) .Proteinuria .Acute diastolic heart failure .Chronic bronchitis .Pneumonia:  This patient is a pleasant gentleman with a long history of difficult to control hypertension and some history  of poor compliance and recreational substance use who presented to the emergency department complaining of abdominal pain shortness of breath and chest pain.  He had been working on an outpatient basis with his primary care provider to get his blood pressures under better control.  He was found to be a hypertensive crisis when he was admitted.  He was initially sent to the intensive care unit on a labetalol drip and nitroglycerin drip.  A cardiology consultation was obtained and they were following him during this hospitalization.  He was eventually taken off the nitroglycerin drip and a labetalol drip.  He was started on oral labetalol and spironolactone.  In addition to furosemide, he was diuresed and tolerated it well.  An echocardiogram was performed and revealed the patient had injection fraction of about 35%.  He also was found to have pneumonia on  chest x-ray and was started on antibiotics.  He received IV antibiotics and transitioned to oral doxycycline which she will need to continue for an additional several days to complete a course of treatment for the pneumonia.  He responded very well to these therapies.  He was transferred to a telemetry unit where he was monitored.  His blood pressure was monitored and did start to rise again but remained relatively stable.  I explained to the patient that he is going to need to be closely monitored over the next several days until his blood pressure has been consistently under better control.  We had started different medications to try and gain better control.  He's also going to followup with his cardiologist.  A 24-hour urine study for catecholamines is pending at this time and he will need to followup with his primary care physician to get the results of these tests.  I explained to the patient that he needed to stop tobacco use and provided tobacco cessation consultation.  Also, the patient will need to maintain a low-sodium diet.  The patient was given instructions to return if symptoms recur like shortness of breath or chest pain or new problems develop.  The patient verbalized understanding.  Of note, pt has chronic renal insufficiency with a baseline creatinine around 1.7.  He will need to have a follow up BMP in the next week with his primary care provider.   Day of Discharge BP 164/110  Pulse 63  Temp(Src) 98.8 F (37.1 C) (Oral)  Resp 20  Ht 6' (1.829 m)  Wt 67.9 kg (149 lb 11.1 oz)  BMI 20.30 kg/m2  SpO2 99%  Physical Exam: Gen.-awake alert eating well in no distress cooperative, no chest pain or shortness of breath or edema HEENT-normocephalic atraumatic, mucous membranes moist Neck-no JVD, thyroid soft no nodules palpated, trachea midline CV-normal S1-S2 sounds Abdomen-soft nondistended nontender no masses palpated, normal bowel sounds Extremity-no pretibial edema cyanosis or  clubbing noted, pedal pulses 2+ bilateral Neuro-no focal deficits, awake alert and oriented x4 Skin-no gross lesions noted.  Results for orders placed during the hospital encounter of 05/22/11 (from the past 24 hour(s))  BASIC METABOLIC PANEL     Status: Abnormal   Collection Time   05/24/11 10:49 AM      Component Value Range   Sodium 138  135 - 145 (mEq/L)   Potassium 4.4  3.5 - 5.1 (mEq/L)   Chloride 106  96 - 112 (mEq/L)   CO2 25  19 - 32 (mEq/L)   Glucose, Bld 90  70 - 99 (mg/dL)   BUN 10  6 - 23 (mg/dL)  Creatinine, Ser 1.68 (*) 0.50 - 1.35 (mg/dL)   Calcium 9.3  8.4 - 10.5 (mg/dL)   GFR calc non Af Amer 50 (*) >90 (mL/min)   GFR calc Af Amer 58 (*) >90 (mL/min)  BASIC METABOLIC PANEL     Status: Abnormal   Collection Time   05/25/11  5:05 AM      Component Value Range   Sodium 139  135 - 145 (mEq/L)   Potassium 3.8  3.5 - 5.1 (mEq/L)   Chloride 104  96 - 112 (mEq/L)   CO2 27  19 - 32 (mEq/L)   Glucose, Bld 92  70 - 99 (mg/dL)   BUN 12  6 - 23 (mg/dL)   Creatinine, Ser 1.68 (*) 0.50 - 1.35 (mg/dL)   Calcium 8.9  8.4 - 10.5 (mg/dL)   GFR calc non Af Amer 50 (*) >90 (mL/min)   GFR calc Af Amer 58 (*) >90 (mL/min)    Disposition: TO HOME WITH FAMILY (WIFE AND CHILDREN)  DIET: LOW SODIUM CARDIAC PRUDENT   Follow-up Appts: Discharge Orders    Future Orders Please Complete By Expires   Diet - low sodium heart healthy      Increase activity slowly      Discharge instructions      Comments:   PLEASE STOP USING ALL TOBACCO PRODUCTS PLEASE HAVE YOUR BLOOD PRESSURE RECHECKED AND SEE YOUR PRIMARY CARE PROVIDER IN 3 DAYS AT [facility] AND TO FOLLOW UP ON ALL OF YOUR TEST RESULTS.  PLEASE CALL TO SET UP FOLLOW UP APPOINTMENT WITH YOUR CARDIOLOGIST. AVOID SODIUM IN YOUR DIET AS MUCH AS POSSIBLE. RETURN IF SYMPTOMS COME BACK LIKE CHEST PAIN OR SHORTNESS OF BREATH OR NEW PROBLEMS DEVELOP.   Lifting restrictions      Comments:   NO HEAVY LIFTING UNTIL CLEARED BY  YOUR PRIMARY CARE PROVIDER.     Call MD for:  persistant nausea and vomiting      Call MD for:  severe uncontrolled pain      Call MD for:  difficulty breathing, headache or visual disturbances      Call MD for:  extreme fatigue      Call MD for:  persistant dizziness or light-headedness      Discontinue IV        Followup  Follow-up with your primary care doctor in 3 days to have your blood pressure retested and checked in 2-3 days and to followup on the results of your hospital tests including the 24 hour urine testing and blood work. Please followup with the [place] [facility] as he will need to have your heart followed on a regular basis.   Tests Needing Follow-up: 24 hour urine study Get a BMP in the next week with primary care provider.   I spent 45 minutes preparing this patient's discharge including reviewing his medical records labs and studies and counseling with patient regarding followup care diet and prescriptions.   Signed: Sanaiya Welliver 05/25/2011, 8:31 AM

## 2011-05-25 NOTE — Discharge Instructions (Signed)
PLEASE FOLLOW UP ON THE RESULTS OF HOSPITAL TESTS AND 24 HOUR URINE STUDY WITH YOUR PRIMARY CARE PROVIDER AT [place]  Hypertension Hypertension is another name for high blood pressure. High blood pressure may mean that your heart needs to work harder to pump blood. Blood pressure consists of two numbers, which includes a higher number over a lower number (example: 110/72). HOME CARE   Make lifestyle changes as told by your doctor. This may include weight loss and exercise.   Take your blood pressure medicine every day.   Limit how much salt you use.   Stop smoking if you smoke.   Do not use drugs.   Talk to your doctor if you are using decongestants or birth control pills. These medicines might make blood pressure higher.   Females should not drink more than 1 alcoholic drink per day. Males should not drink more than 2 alcoholic drinks per day.   See your doctor as told.  GET HELP RIGHT AWAY IF:   You have a blood pressure reading with a top number of 180 or higher.   You get a very bad headache.   You get blurred or changing vision.   You feel confused.   You feel weak, numb, or faint.   You get chest or belly (abdominal) pain.   You throw up (vomit).   You cannot breathe very well.  MAKE SURE YOU:   Understand these instructions.   Will watch your condition.   Will get help right away if you are not doing well or get worse.  Document Released: 08/19/2007 Document Revised: 02/19/2011 Document Reviewed: 08/19/2007 [facility] Patient Information 2012 [place].  Heart Failure Heart failure (HF) means your heart has trouble pumping blood. The blood is not circulated very well in your body because your heart is weak. HF may cause blood to back up into your lungs. This is commonly called "fluid in the lungs." HF may also cause your ankles and legs to puff up (swell). It is important to take good care of yourself when you have HF. [place]  Take your medicine as told by your doctor.   Do not stop taking your medicine unless told to by your doctor.   Be sure to get your medicine refilled before it runs out.   Do not skip any doses of medicine.   Tell your doctor if you cannot afford your medicine.   Keep a list of all the medicine you take. This should include the name, how much you take, and when you take it.   Ask your doctor if you have any questions about your medicine. Do not take over-the-counter medicine unless your doctor says it is okay.  What you eat  Do not drink alcohol unless your doctor says it is okay.   Avoid food that is high in fat. Avoid foods fried in oil or made with fat.   Eat a healthy diet. A dietitian can help you with healthy food choices.   Limit how much salt you eat. Do not eat more than 1500 milligrams (mg) of salt (sodium) a day.   Do not add salt to your food.   Do not eat food made with a lot of salt. Here are some examples:   Canned vegetables.   Canned soups.   Canned drinks.   Hot dogs.   Fast food.   Pizza.   Chips.  Check your weight  Weigh yourself every morning. You  should do this after you pee (urinate) and before you eat breakfast.   Wear the same amount of clothes each time you weigh yourself.   Write down your weight every day. Tell your doctor if you gain 3 lb/1.4 kg or more in 1 day or 5 lb/2.3 kg in a week.  Blood pressure monitoring  Buy a home blood pressure cuff.   Check your blood pressure as told by your doctor. Write down your blood pressure numbers on a sheet of paper.   Bring your blood pressure numbers to your doctor visits.  Smoking  Smoking is bad for your heart.   Ask your doctor how to stop smoking.  Exercise  Talk to your doctor about exercise.   Ask how much exercise is right for you.   Exercise as much as you can. Stop if you feel tired, have problems breathing, or have chest pain.  Keep all your doctor  appointments. GET HELP RIGHT AWAY IF:   You have trouble breathing.   You have a cough that does not go away.   You cannot sleep because you have trouble breathing.   You gain 3 lb/1.4 kg or more in 1 day or 5 lb/2.3 kg in a week.   You have puffy ankles or legs.   You have an enlarged (bloated) belly (abdomen).   You pass out (faint).   You have really bad chest pain or pressure. This includes pain or pressure in your:   Arms.   Jaw.   Neck.   Back.  If you have any of the above problems, call your local emergency services (911 in [place] Do not drive yourself to the hospital. MAKE SURE YOU:   Understand these instructions.   Will watch your condition.   Will get help right away if you are not doing well or get worse.  Document Released: 12/10/2007 Document Revised: 02/19/2011 Document Reviewed: 07/03/2008 [facility] Patient Information 2012 [place].  Pneumonia, Adult Pneumonia is an infection of the lungs.  CAUSES Pneumonia may be caused by bacteria or a virus. Usually, these infections are caused by breathing infectious particles into the lungs (respiratory tract). SYMPTOMS   Cough.   Fever.   Chest pain.   Increased rate of breathing.   Wheezing.   Mucus production.  DIAGNOSIS  If you have the common symptoms of pneumonia, your caregiver will typically confirm the diagnosis with a chest X-ray. The X-ray will show an abnormality in the lung (pulmonary infiltrate) if you have pneumonia. Other tests of your blood, urine, or sputum may be done to find the specific cause of your pneumonia. Your caregiver may also do tests (blood gases or pulse oximetry) to see how well your lungs are working. TREATMENT  Some forms of pneumonia may be spread to other people when you cough or sneeze. You may be asked to wear a mask before and during your exam. Pneumonia that is caused by bacteria is treated with antibiotic medicine. Pneumonia that is caused by the influenza  virus may be treated with an antiviral medicine. Most other viral infections must run their course. These infections will not respond to antibiotics.  PREVENTION A pneumococcal shot (vaccine) is available to prevent a common bacterial cause of pneumonia. This is usually suggested for:  People over 1 years old.   Patients on chemotherapy.   People with chronic lung problems, such as bronchitis or emphysema.   People with immune system problems.  If you are over 65 or have  a high risk condition, you may receive the pneumococcal vaccine if you have not received it before. In some countries, a routine influenza vaccine is also recommended. This vaccine can help prevent some cases of pneumonia.You may be offered the influenza vaccine as part of your care. If you smoke, it is time to quit. You may receive instructions on how to stop smoking. Your caregiver can provide medicines and counseling to help you quit. HOME CARE INSTRUCTIONS   Cough suppressants may be used if you are losing too much rest. However, coughing protects you by clearing your lungs. You should avoid using cough suppressants if you can.   Your caregiver may have prescribed medicine if he or she thinks your pneumonia is caused by a bacteria or influenza. Finish your medicine even if you start to feel better.   Your caregiver may also prescribe an expectorant. This loosens the mucus to be coughed up.   Only take over-the-counter or prescription medicines for pain, discomfort, or fever as directed by your caregiver.   Do not smoke. Smoking is a common cause of bronchitis and can contribute to pneumonia. If you are a smoker and continue to smoke, your cough may last several weeks after your pneumonia has cleared.   A cold steam vaporizer or humidifier in your room or home may help loosen mucus.   Coughing is often worse at night. Sleeping in a semi-upright position in a recliner or using a couple pillows under your head will help  with this.   Get rest as you feel it is needed. Your body will usually let you know when you need to rest.  SEEK IMMEDIATE MEDICAL CARE IF:   Your illness becomes worse. This is especially true if you are elderly or weakened from any other disease.   You cannot control your cough with suppressants and are losing sleep.   You begin coughing up blood.   You develop pain which is getting worse or is uncontrolled with medicines.   You have a fever.   Any of the symptoms which initially brought you in for treatment are getting worse rather than better.   You develop shortness of breath or chest pain.  MAKE SURE YOU:   Understand these instructions.   Will watch your condition.   Will get help right away if you are not doing well or get worse.  Document Released: 03/02/2005 Document Revised: 02/19/2011 Document Reviewed: 05/22/2010 [facility] Patient Information 2012 [place].  Smoking Cessation This document explains the best ways for you to quit smoking and new treatments to help. It lists new medicines that can double or triple your chances of quitting and quitting for good. It also considers ways to avoid relapses and concerns you may have about quitting, including weight gain. NICOTINE: A POWERFUL ADDICTION If you have tried to quit smoking, you know how hard it can be. It is hard because nicotine is a very addictive drug. For some people, it can be as addictive as heroin or cocaine. Usually, people make 2 or 3 tries, or more, before finally being able to quit. Each time you try to quit, you can learn about what helps and what hurts. Quitting takes hard work and a lot of effort, but you can quit smoking. QUITTING SMOKING IS ONE OF THE MOST IMPORTANT THINGS YOU WILL EVER DO.  You will live longer, feel better, and live better.   The impact on your body of quitting smoking is felt almost immediately:   Within  20 minutes, blood pressure decreases. Pulse returns to its normal  level.   After 8 hours, carbon monoxide levels in the blood return to normal. Oxygen level increases.   After 24 hours, chance of heart attack starts to decrease. Breath, hair, and body stop smelling like smoke.   After 48 hours, damaged nerve endings begin to recover. Sense of taste and smell improve.   After 72 hours, the body is virtually free of nicotine. Bronchial tubes relax and breathing becomes easier.   After 2 to 12 weeks, lungs can hold more air. Exercise becomes easier and circulation improves.   Quitting will reduce your risk of having a heart attack, stroke, cancer, or lung disease:   After 1 year, the risk of coronary heart disease is cut in half.   After 5 years, the risk of stroke falls to the same as a nonsmoker.   After 10 years, the risk of lung cancer is cut in half and the risk of other cancers decreases significantly.   After 15 years, the risk of coronary heart disease drops, usually to the level of a nonsmoker.   If you are pregnant, quitting smoking will improve your chances of having a healthy baby.   The people you live with, especially your children, will be healthier.   You will have extra money to spend on things other than cigarettes.  FIVE KEYS TO QUITTING Studies have shown that these 5 steps will help you quit smoking and quit for good. You have the best chances of quitting if you use them together: 1. Get ready.  2. Get support and encouragement.  3. Learn new skills and behaviors.  4. Get medicine to reduce your nicotine addiction and use it correctly.  5. Be prepared for relapse or difficult situations. Be determined to continue trying to quit, even if you do not succeed at first.  1. GET READY  Set a quit date.   Change your environment.   Get rid of ALL cigarettes, ashtrays, matches, and lighters in your home, car, and place of work.   Do not let people smoke in your home.   Review your past attempts to quit. Think about what worked  and what did not.   Once you quit, do not smoke. NOT EVEN A PUFF!  2. GET SUPPORT AND ENCOURAGEMENT Studies have shown that you have a better chance of being successful if you have help. You can get support in many ways.  Tell your family, friends, and coworkers that you are going to quit and need their support. Ask them not to smoke around you.   Talk to your caregivers (doctor, dentist, nurse, pharmacist, psychologist, and/or smoking counselor).   Get individual, group, or telephone counseling and support. The more counseling you have, the better your chances are of quitting. Programs are available at [employer] and health centers. Call your local health department for information about programs in your area.   Spiritual beliefs and practices may help some smokers quit.   Quit meters are Insurance underwriter that keep track of quit statistics, such as amount of "quit-time," cigarettes not smoked, and money saved.   Many smokers find one or more of the many self-help books available useful in helping them quit and stay off tobacco.  3. LEARN NEW SKILLS AND BEHAVIORS  Try to distract yourself from urges to smoke. Talk to someone, go for a walk, or occupy your time with a task.   When you  first try to quit, change your routine. Take a different route to work. Drink tea instead of coffee. Eat breakfast in a different place.   Do something to reduce your stress. Take a hot bath, exercise, or read a book.   Plan something enjoyable to do every day. Reward yourself for not smoking.   Explore interactive web-based programs that specialize in helping you quit.  4. GET MEDICINE AND USE IT CORRECTLY Medicines can help you stop smoking and decrease the urge to smoke. Combining medicine with the above behavioral methods and support can quadruple your chances of successfully quitting smoking. The [place] Food and Drug Administration (FDA) has approved 7 medicines to help  you quit smoking. These medicines fall into 3 categories.  Nicotine replacement therapy (delivers nicotine to your body without the negative effects and risks of smoking):   Nicotine gum: Available over-the-counter.   Nicotine lozenges: Available over-the-counter.   Nicotine inhaler: Available by prescription.   Nicotine nasal spray: Available by prescription.   Nicotine skin patches (transdermal): Available by prescription and over-the-counter.   Antidepressant medicine (helps people abstain from smoking, but how this works is unknown):   Bupropion sustained-release (SR) tablets: Available by prescription.   Nicotinic receptor partial agonist (simulates the effect of nicotine in your brain):   Varenicline tartrate tablets: Available by prescription.   Ask your caregiver for advice about which medicines to use and how to use them. Carefully read the information on the package.   Everyone who is trying to quit may benefit from using a medicine. If you are pregnant or trying to become pregnant, nursing an infant, you are under age 30, or you smoke fewer than 10 cigarettes per day, talk to your caregiver before taking any nicotine replacement medicines.   You should stop using a nicotine replacement product and call your caregiver if you experience nausea, dizziness, weakness, vomiting, fast or irregular heartbeat, mouth problems with the lozenge or gum, or redness or swelling of the skin around the patch that does not go away.   Do not use any other product containing nicotine while using a nicotine replacement product.   Talk to your caregiver before using these products if you have diabetes, heart disease, asthma, stomach ulcers, you had a recent heart attack, you have high blood pressure that is not controlled with medicine, a history of irregular heartbeat, or you have been prescribed medicine to help you quit smoking.  5. BE PREPARED FOR RELAPSE OR DIFFICULT SITUATIONS  Most  relapses occur within the first 3 months after quitting. Do not be discouraged if you start smoking again. Remember, most people try several times before they finally quit.   You may have symptoms of withdrawal because your body is used to nicotine. You may crave cigarettes, be irritable, feel very hungry, cough often, get headaches, or have difficulty concentrating.   The withdrawal symptoms are only temporary. They are strongest when you first quit, but they will go away within 10 to 14 days.  Here are some difficult situations to watch for:  Alcohol. Avoid drinking alcohol. Drinking lowers your chances of successfully quitting.   Caffeine. Try to reduce the amount of caffeine you consume. It also lowers your chances of successfully quitting.   Other smokers. Being around smoking can make you want to smoke. Avoid smokers.   Weight gain. Many smokers will gain weight when they quit, usually less than 10 pounds. Eat a healthy diet and stay active. Do not let weight gain distract  you from your main goal, quitting smoking. Some medicines that help you quit smoking may also help delay weight gain. You can always lose the weight gained after you quit.   Bad mood or depression. There are a lot of ways to improve your mood other than smoking.  If you are having problems with any of these situations, talk to your caregiver. SPECIAL SITUATIONS AND CONDITIONS Studies suggest that everyone can quit smoking. Your situation or condition can give you a special reason to quit.  Pregnant women/new mothers: By quitting, you protect your baby's health and your own.   Hospitalized patients: By quitting, you reduce health problems and help healing.   Heart attack patients: By quitting, you reduce your risk of a second heart attack.   Lung, head, and neck cancer patients: By quitting, you reduce your chance of a second cancer.   Parents of children and adolescents: By quitting, you protect your children from  illnesses caused by secondhand smoke.  QUESTIONS TO THINK ABOUT Think about the following questions before you try to stop smoking. You may want to talk about your answers with your caregiver.  Why do you want to quit?   If you tried to quit in the past, what helped and what did not?   What will be the most difficult situations for you after you quit? How will you plan to handle them?   Who can help you through the tough times? Your family? Friends? Caregiver?   What pleasures do you get from smoking? What ways can you still get pleasure if you quit?  Here are some questions to ask your caregiver:  How can you help me to be successful at quitting?   What medicine do you think would be best for me and how should I take it?   What should I do if I need more help?   What is smoking withdrawal like? How can I get information on withdrawal?  Quitting takes hard work and a lot of effort, but you can quit smoking. FOR MORE INFORMATION  Smokefree.gov (Inrails.tn) provides free, accurate, evidence-based information and professional assistance to help support the immediate and long-term needs of people trying to quit smoking. Document Released: 02/24/2001 Document Revised: 02/19/2011 Document Reviewed: 12/17/2008 [facility] Patient Information 2012 [place].  Smoking Cessation, Tips for Success YOU CAN QUIT SMOKING If you are ready to quit smoking, congratulations! You have chosen to help yourself be healthier. Cigarettes bring nicotine, tar, carbon monoxide, and other irritants into your body. Your lungs, heart, and blood vessels will be able to work better without these poisons. There are many different ways to quit smoking. Nicotine gum, nicotine patches, a nicotine inhaler, or nicotine nasal spray can help with physical craving. Hypnosis, support groups, and medicines help break the habit of smoking. Here are some tips to help you quit for good.  Throw away all  cigarettes.   Clean and remove all ashtrays from your home, work, and car.   On a card, write down your reasons for quitting. Carry the card with you and read it when you get the urge to smoke.   Cleanse your body of nicotine. Drink enough water and fluids to keep your urine clear or pale yellow. Do this after quitting to flush the nicotine from your body.   Learn to predict your moods. Do not let a bad situation be your excuse to have a cigarette. Some situations in your life might tempt you into wanting a cigarette.  Never have "just one" cigarette. It leads to wanting another and another. Remind yourself of your decision to quit.   Change habits associated with smoking. If you smoked while driving or when feeling stressed, try other activities to replace smoking. Stand up when drinking your coffee. Brush your teeth after eating. Sit in a different chair when you read the paper. Avoid alcohol while trying to quit, and try to drink fewer caffeinated beverages. Alcohol and caffeine may urge you to smoke.   Avoid foods and drinks that can trigger a desire to smoke, such as sugary or spicy foods and alcohol.   Ask people who smoke not to smoke around you.   Have something planned to do right after eating or having a cup of coffee. Take a walk or exercise to perk you up. This will help to keep you from overeating.   Try a relaxation exercise to calm you down and decrease your stress. Remember, you may be tense and nervous for the first 2 weeks after you quit, but this will pass.   Find new activities to keep your hands busy. Play with a pen, coin, or rubber band. Doodle or draw things on paper.   Brush your teeth right after eating. This will help cut down on the craving for the taste of tobacco after meals. You can try mouthwash, too.   Use oral substitutes, such as lemon drops, carrots, a cinnamon stick, or chewing gum, in place of cigarettes. Keep them handy so they are available when you  have the urge to smoke.   When you have the urge to smoke, try deep breathing.   Designate your home as a nonsmoking area.   If you are a heavy smoker, ask your caregiver about a prescription for nicotine chewing gum. It can ease your withdrawal from nicotine.   Reward yourself. Set aside the cigarette money you save and buy yourself something nice.   Look for support from others. Join a support group or smoking cessation program. Ask someone at home or at work to help you with your plan to quit smoking.   Always ask yourself, "Do I need this cigarette or is this just a reflex?" Tell yourself, "Today, I choose not to smoke," or "I do not want to smoke." You are reminding yourself of your decision to quit, even if you do smoke a cigarette.  HOW WILL I FEEL WHEN I QUIT SMOKING?  The benefits of not smoking start within days of quitting.   You may have symptoms of withdrawal because your body is used to nicotine (the addictive substance in cigarettes). You may crave cigarettes, be irritable, feel very hungry, cough often, get headaches, or have difficulty concentrating.   The withdrawal symptoms are only temporary. They are strongest when you first quit but will go away within 10 to 14 days.   When withdrawal symptoms occur, stay in control. Think about your reasons for quitting. Remind yourself that these are signs that your body is healing and getting used to being without cigarettes.   Remember that withdrawal symptoms are easier to treat than the major diseases that smoking can cause.   Even after the withdrawal is over, expect periodic urges to smoke. However, these cravings are generally short-lived and will go away whether you smoke or not. Do not smoke!   If you relapse and smoke again, do not lose hope. Most smokers quit 3 times before they are successful.   If you relapse, do not give  up! Plan ahead and think about what you will do the next time you get the urge to smoke.  LIFE  AS A NONSMOKER: MAKE IT FOR A MONTH, MAKE IT FOR LIFE Day 1: Hang this page where you will see it every day. Day 2: Get rid of all ashtrays, matches, and lighters. Day 3: Drink water. Breathe deeply between sips. Day 4: Avoid places with smoke-filled air, such as bars, clubs, or the smoking section of restaurants. Day 5: Keep track of how much money you save by not smoking. Day 6: Avoid boredom. Keep a good book with you or go to the movies. Day 7: Reward yourself! One week without smoking! Day 8: Make a dental appointment to get your teeth cleaned. Day 9: Decide how you will turn down a cigarette before it is offered to you. Day 10: Review your reasons for quitting. Day 11: Distract yourself. Stay active to keep your mind off smoking and to relieve tension. Take a walk, exercise, read a book, do a crossword puzzle, or try a new hobby. Day 12: Exercise. Get off the bus before your stop or use stairs instead of escalators. Day 13: Call on friends for support and encouragement. Day 14: Reward yourself! Two weeks without smoking! Day 15: Practice deep breathing exercises. Day 16: Bet a friend that you can stay a nonsmoker. Day 17: Ask to sit in nonsmoking sections of restaurants. Day 18: Hang up "No Smoking" signs. Day 19: Think of yourself as a nonsmoker. Day 20: Each morning, tell yourself you will not smoke. Day 21: Reward yourself! Three weeks without smoking! Day 22: Think of smoking in negative ways. Remember how it stains your teeth, gives you bad breath, and leaves you short of breath. Day 23: Eat a nutritious breakfast. Day 24:Do not relive your days as a smoker. Day 25: Hold a pencil in your hand when talking on the telephone. Day 26: Tell all your friends you do not smoke. Day 27: Think about how much better food tastes. Day 28: Remember, one cigarette is one too many. Day 29: Take up a hobby that will keep your hands busy. Day 30: Congratulations! One month without smoking!  Give yourself a big reward. Your caregiver can direct you to community resources or hospitals for support, which may include:  Group support.   Education.   Hypnosis.   Subliminal therapy.  Document Released: 11/29/2003 Document Revised: 02/19/2011 Document Reviewed: 12/17/2008 [facility] Patient Information 2012 [place].

## 2011-05-25 NOTE — Progress Notes (Signed)
Follow up bp check after one time dose iv hydralazine given is 164/110, pulse 72. Patient remains asymptomatic-am po bp meds given at this time. Will continue to monitor.   awalker,rn

## 2011-05-27 LAB — METANEPHRINES, URINE, 24 HOUR: Volume, Urine-METAN: 2200 mL

## 2011-05-29 LAB — CATECHOLAMINES, FRACTIONATED, URINE, 24 HOUR
Catecholamines T: 86 mcg/24 h (ref 26–121)
Dopamine 24 Hr Urine: 98 mcg/24 h (ref 52–480)
Epinephrine 24 Hr Urine: 6 mcg/24 h (ref 2–24)
Total urine volume: 2200 mL

## 2011-05-30 DIAGNOSIS — I10 Essential (primary) hypertension: Secondary | ICD-10-CM | POA: Diagnosis not present

## 2011-05-30 DIAGNOSIS — I509 Heart failure, unspecified: Secondary | ICD-10-CM | POA: Diagnosis not present

## 2011-05-31 LAB — ALDOSTERONE + RENIN ACTIVITY W/ RATIO
ALDO / PRA Ratio: 50 Ratio — ABNORMAL HIGH (ref 0.9–28.9)
PRA LC/MS/MS: 0.12 ng/mL/h — ABNORMAL LOW (ref 0.25–5.82)

## 2011-06-02 ENCOUNTER — Ambulatory Visit (INDEPENDENT_AMBULATORY_CARE_PROVIDER_SITE_OTHER): Payer: Medicare Other | Admitting: *Deleted

## 2011-06-02 DIAGNOSIS — I10 Essential (primary) hypertension: Secondary | ICD-10-CM | POA: Diagnosis not present

## 2011-06-02 LAB — BASIC METABOLIC PANEL
CO2: 24 mEq/L (ref 19–32)
Chloride: 111 mEq/L (ref 96–112)
Potassium: 4.2 mEq/L (ref 3.5–5.1)

## 2011-06-03 ENCOUNTER — Encounter: Payer: Self-pay | Admitting: *Deleted

## 2011-06-09 ENCOUNTER — Ambulatory Visit (INDEPENDENT_AMBULATORY_CARE_PROVIDER_SITE_OTHER): Payer: Medicare Other | Admitting: Cardiology

## 2011-06-09 ENCOUNTER — Encounter: Payer: Self-pay | Admitting: Cardiology

## 2011-06-09 VITALS — BP 162/102 | HR 65 | Ht 72.0 in | Wt 166.0 lb

## 2011-06-09 DIAGNOSIS — I5022 Chronic systolic (congestive) heart failure: Secondary | ICD-10-CM

## 2011-06-09 DIAGNOSIS — I10 Essential (primary) hypertension: Secondary | ICD-10-CM

## 2011-06-09 DIAGNOSIS — F17200 Nicotine dependence, unspecified, uncomplicated: Secondary | ICD-10-CM

## 2011-06-09 DIAGNOSIS — R011 Cardiac murmur, unspecified: Secondary | ICD-10-CM

## 2011-06-09 DIAGNOSIS — I428 Other cardiomyopathies: Secondary | ICD-10-CM | POA: Diagnosis not present

## 2011-06-09 DIAGNOSIS — R0602 Shortness of breath: Secondary | ICD-10-CM | POA: Diagnosis not present

## 2011-06-09 DIAGNOSIS — I509 Heart failure, unspecified: Secondary | ICD-10-CM

## 2011-06-09 MED ORDER — HYDRALAZINE HCL 50 MG PO TABS: 50.0000 mg | ORAL_TABLET | Freq: Three times a day (TID) | ORAL | Status: DC

## 2011-06-09 NOTE — Patient Instructions (Addendum)
Your physician has recommended you make the following change in your medication: STOP your Imdur and START Hydralazine 50mg  three times per day  Your physician recommends that you schedule a follow-up appointment in: one month with labs  Your physician recommends that you return for lab work in: one month at your follow up appt (BMET, BNP)  Your physician has requested that you have an echocardiogram in Sept. Echocardiography is a painless test that uses sound waves to create images of your heart. It provides your doctor with information about the size and shape of your heart and how well your heart's chambers and valves are working. This procedure takes approximately one hour. There are no restrictions for this procedure.  Webb Silversmith, Dr Claris Gladden nurse will call you in 2 weeks to check on your blood pressure readings.  Please write them down so that you can tell her what the readings are.  Your physician has requested that you have en exercise stress myoview. For further information please visit [email or web address]. Please follow instruction sheet, as given.

## 2011-06-09 NOTE — Progress Notes (Signed)
PCP: [facility]  41 yo with history of uncontrolled HTN and ETOH abuse presents to establish outpatient cardiology followup after recent admission with hypertensive emergency and pulmonary edema.  Prior to admission in 3/13, patient was on no medications.  He was drinking 2 pints liquor/day.  He was admitted with severe dyspnea and was found to have BP > 200/100 with pulmonary edema.  He was diuresed and BP was controlled.  Echo showed EF 35-40% with diffuse hypokinesis and moderate to severe mitral regurgitation.  Cardiomyopathy was thought to be most likely due to HTN and ETOH abuse.  Cardiac cath was not done due to elevated creatinine, which was thought to be secondary to uncontrolled BP as well.   Since getting home from the hospital, he has been doing better.  He no longer has exertional dyspnea.  No chest pain.  SBP is 162/102 today but he has not taken any of his meds.  He has been checking BP regularly at home after taking his meds and it is always < 140/90.  He has completely cut out ETOH.  He is still smoking 2-3 cigarettes/day. Main complaint is a daily headache.  He saw his PCP and was told to stop hydralazine over concern that it was causing the headache but this did not help.  He is taking Imdur.   ECG: NSR, LAE, LVH, lateral T wave inversions likely repolarization abnormality in setting of LVH  Labs (3/13): K 4.2, creatinine 1.7, urinary metanephrine and catecholeamine levels normal, LDL 94, HDL 38, pro-BNP 7080 => 2737  PMH: 1. H/o ankle fractures bilaterally 2. GERD 3. HTN: Negative urinary catecholeamine collection for pheochromocytoma.  4. CKD: Suspect hypertensive nephropathy.  5. Cardiomyopathy: Most likely due to heavy ETOH ingestion and uncontrolled HTN.   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   6. ETOH abuse: Quit 3/13.  7. Active smoker. 8.  Valvular heart disease: Echo in 3/13 showed moderate to severe eccentric MR and mild to moderate AI with prolapsing left coronary cusp.  9. Headache with Imdur.   SH: On disability.  Married, has 5 biological children and 5 adopted children.  H/o heavy ETOH use, about 2 pints liquor/day until he quit in 3/13.  Smokes but now down to 2-3 cigs/day.    ROS:  All systems reviewed and negative except as per HPI.   FH: No premature CAD.   Current Outpatient Prescriptions  Medication Sig Dispense Refill  . albuterol (PROVENTIL HFA;VENTOLIN HFA) 108 (90 BASE) MCG/ACT inhaler Inhale 1-2 puffs into the lungs every 6 (six) hours as needed for wheezing.  1 Inhaler  0  . ALPRAZolam (XANAX) 1 MG tablet Take 1 mg by mouth at bedtime.      [name] aspirin EC 81 MG EC tablet Take 1 tablet (81 mg total) by mouth daily.  30 tablet  0  . carvedilol (COREG) 12.5 MG tablet Take 1 tablet (12.5 mg total) by mouth 2 (two) times daily with a meal.  60 tablet  0  . doxycycline (VIBRAMYCIN) 100 MG capsule Take 100 mg by mouth 2 (two) times daily.      . furosemide (LASIX) 20 MG tablet Take 1 tablet (20 mg total) by mouth daily.  30 tablet  0  . lisinopril (PRINIVIL,ZESTRIL) 20 MG tablet Take 20 mg by mouth daily. 1/2 Daily      . sertraline (ZOLOFT) 50  MG tablet Take 50 mg by mouth daily.      [name] spironolactone (ALDACTONE) 25 MG tablet Take 1 tablet (25 mg total) by mouth daily.  30 tablet  0  . hydrALAZINE (APRESOLINE) 50 MG tablet Take 1 tablet (50 mg total) by mouth 3 (three) times daily.  120 tablet  0    BP 162/102  Pulse 65  Ht 6' (1.829 m)  Wt 166 lb (75.297 kg)  BMI 22.51 kg/m2 General: NAD Neck: No JVD, no thyromegaly or thyroid nodule.  Lungs: Clear to auscultation bilaterally with normal respiratory effort. CV: Nondisplaced PMI.  Heart regular S1/S2, no [id number], 2/6 diastolic murmur along the sternal border.  No peripheral edema.  No carotid bruit.  Normal pedal pulses.  Abdomen: Soft, nontender, no  hepatosplenomegaly, no distention.  Skin: Intact without lesions or rashes.  Neurologic: Alert and oriented x 3.  Psych: Normal affect. Extremities: No clubbing or cyanosis.  HEENT: Normal.

## 2011-06-10 DIAGNOSIS — I119 Hypertensive heart disease without heart failure: Secondary | ICD-10-CM | POA: Insufficient documentation

## 2011-06-10 NOTE — Assessment & Plan Note (Signed)
BP high today but he has not taken any of his meds.  Per patient, BP has been doing quite well at home.  Restart hydralazine 50 mg tid (most likely Imdur, not hydralazine, was the cause of the headaches).  We will call him in 2 wks to see what BP is running on all meds.   Followup in 1 month.

## 2011-06-10 NOTE — Assessment & Plan Note (Addendum)
Patient is symptomatically much improved since hospitalization.  NYHA class II symptoms.  He appears euvolemic on exam.  The most likely causes of the cardiomyopathy are heavy ETOH and uncontrolled HTN.  He has stopped drinking and we are working to control the BP.  - Continue Coreg 12.5 mg bid, lisinopril 10 mg daily, spironolactone 25 mg daily.  - Restart hydralazine 50 mg tid.  He will stop Imdur due to severe headache.  - We have not ruled out CAD fully, though think this is a less likely cause of cardiomyopathy.  Will avoid left heart cath given CKD.  I will set him up for an ETT-myoview.  - Check BMET/BNP in 1 month.  - Repeat echo in 9/13 after 6 months of medical management.

## 2011-06-10 NOTE — Assessment & Plan Note (Signed)
He has a significant diastolic murmur on exam.  He had mild to moderate AI and moderate to severe MR on last echo.  Plan to repeat echo in 9/13 on medical management to lower BP.  Will see if valvular regurgitation improves with controlled BP.

## 2011-06-10 NOTE — Assessment & Plan Note (Signed)
I counselled the patient to quit smoking.

## 2011-06-17 ENCOUNTER — Ambulatory Visit (HOSPITAL_COMMUNITY): Payer: Medicare Other | Attending: Cardiology | Admitting: Radiology

## 2011-06-17 DIAGNOSIS — R002 Palpitations: Secondary | ICD-10-CM | POA: Insufficient documentation

## 2011-06-17 DIAGNOSIS — R079 Chest pain, unspecified: Secondary | ICD-10-CM | POA: Insufficient documentation

## 2011-06-17 DIAGNOSIS — I10 Essential (primary) hypertension: Secondary | ICD-10-CM | POA: Insufficient documentation

## 2011-06-17 DIAGNOSIS — Z87891 Personal history of nicotine dependence: Secondary | ICD-10-CM | POA: Diagnosis not present

## 2011-06-17 DIAGNOSIS — I509 Heart failure, unspecified: Secondary | ICD-10-CM | POA: Insufficient documentation

## 2011-06-17 DIAGNOSIS — R0602 Shortness of breath: Secondary | ICD-10-CM

## 2011-06-17 MED ORDER — TECHNETIUM TC 99M TETROFOSMIN IV KIT
11.0000 | PACK | Freq: Once | INTRAVENOUS | Status: AC | PRN
  Administered 2011-06-17: 11 via INTRAVENOUS

## 2011-06-17 MED ORDER — TECHNETIUM TC 99M TETROFOSMIN IV KIT
33.0000 | PACK | Freq: Once | INTRAVENOUS | Status: AC | PRN
  Administered 2011-06-17: 33 via INTRAVENOUS

## 2011-06-17 MED ORDER — REGADENOSON 0.4 MG/5ML IV SOLN
0.4000 mg | Freq: Once | INTRAVENOUS | Status: AC
  Administered 2011-06-17: 0.4 mg via INTRAVENOUS

## 2011-06-17 NOTE — Progress Notes (Addendum)
[facility] [place] [address] [phone]  Cardiology Nuclear Med Study  HASKEL TARDY is a 41 y.o. male     MRN : [id number]     DOB: 1971-02-14  Procedure Date: 06/17/2011  Nuclear Med Background Indication for Stress Test:  Evaluation for Ischemia History: CHF,Cardiomyopathy, 05/23/11 ECHO: EF: 35-40%, AI/MR Cardiac Risk Factors: Hypertension and Smoker  Symptoms:  Chest Pain, Palpitations and SOB   Nuclear Pre-Procedure Caffeine/Decaff Intake:  6:00pm NPO After: 11:30pm   Lungs:  clear O2 Sat: 98% on room air. IV 0.9% NS with Angio Cath:  20g  IV Site: R Antecubital x 1, tolerated well IV Started by:  Irven Baltimore, RN  Chest Size (in):  40 Cup Size: n/a  Height: 6' (1.829 m)  Weight:  157 lb (71.215 kg)  BMI:  Body mass index is 21.29 kg/(m^2). Tech Comments:  Held coreg x 36 hrs Patient changed to [employer] 2nd to HTN 162/104    Nuclear Med Study 1 or 2 day study: 1 day  Stress Test Type:  Lexiscan  Reading MD: [name], MD  Order Authorizing Provider:  [name], MD  Resting Radionuclide: Technetium 16m Tetrofosmin  Resting Radionuclide Dose:11.0 mCi   Stress Radionuclide:  Technetium 1m Tetrofosmin  Stress Radionuclide Dose: 33.0 mCi           Stress Protocol Rest HR: 69 Stress HR: 109  Rest BP: 162/104 Stress BP: 173/116  Exercise Time (min): n/a METS: n/a   Predicted Max HR: 182 bpm % Max HR: 59.89 bpm Rate Pressure Product: [id number]   Dose of Adenosine (mg):  n/a Dose of Lexiscan: 0.4 mg  Dose of Atropine (mg): n/a Dose of Dobutamine: n/a mcg/kg/min (at max HR)  Stress Test Technologist: Perrin Maltese, EMT-P  Nuclear Technologist:  Charlton Amor, CNMT     Rest Procedure:  Myocardial perfusion imaging was performed at rest 45 minutes following the intravenous administration of Technetium 79m Tetrofosmin. Rest ECG: NSR with non-specific ST-T wave changes  Stress Procedure:  The patient  received IV Lexiscan 0.4 mg over 15-seconds.  Technetium 28m Tetrofosmin injected at 30-seconds.  There were no significant changes and lt. Headed with [employer].  Quantitative spect images were obtained after a 45 minute delay. Stress ECG: No significant change from baseline ECG  QPS Raw Data Images:  Normal; no motion artifact; normal heart/lung ratio. Stress Images:  Normal homogeneous uptake in all areas of the myocardium. Rest Images:  Normal homogeneous uptake in all areas of the myocardium. Subtraction (SDS):  There is no evidence of scar or ischemia. Transient Ischemic Dilatation (Normal <1.22):  0.98 Lung/Heart Ratio (Normal <0.45):  0.24  Quantitative Gated Spect Images QGS EDV:  192 ml QGS ESV:  120 ml  Impression Exercise Capacity:  Lexiscan with no exercise. BP Response:  Hypertensive blood pressure response. Clinical Symptoms:  Lightheaded ECG Impression:  Inferior and anterolateral T wave inversion and ST depression at baseline, minimal change with infusion.  Comparison with Prior Nuclear Study: No images to compare  Overall Impression:  Abnormal stress nuclear study.  No evidence of ischemia or infarction.  Low EF.  Suspect nonischemic cardiomyopathy.   LV Ejection Fraction: 37%.  LV Wall Motion:  Global hypokinesis.   Cambell Stanek  EF 37%.  No ischemia or infarction.   [name] 06/18/2011 11:44 AM

## 2011-06-18 ENCOUNTER — Telehealth: Payer: Self-pay | Admitting: Cardiology

## 2011-06-18 NOTE — Telephone Encounter (Signed)
Spoke with pt

## 2011-06-18 NOTE — Progress Notes (Signed)
Spoke with pt's wife. She states pt has been going to get  his BP checked at his PCP.  Wife states she did not have the readings in front of her but states it  has been better than at time of last office visit with Dr Aundra Dubin.

## 2011-06-18 NOTE — Telephone Encounter (Signed)
Fu call °Patient returning your call °

## 2011-06-23 ENCOUNTER — Telehealth: Payer: Self-pay | Admitting: *Deleted

## 2011-06-23 NOTE — Telephone Encounter (Signed)
HYPERTENSION - [name], MD 06/10/2011 12:12 AM Signed  BP high today but he has not taken any of his meds. Per patient, BP has been doing quite well at home. Restart hydralazine 50 mg tid (most likely  Imdur, not hydralazine, was the cause of the headaches). We will call him in 2 wks to see what BP is running on all meds.   06/23/11 LMTCB to get recent BP readings

## 2011-06-25 NOTE — Telephone Encounter (Signed)
Pt asleep. LM for pt to call me tomorrow.

## 2011-06-30 NOTE — Telephone Encounter (Signed)
Unable to reach pt by telephone after several attempts.

## 2011-07-02 ENCOUNTER — Telehealth: Payer: Self-pay | Admitting: Cardiology

## 2011-07-02 MED ORDER — LISINOPRIL 20 MG PO TABS: 10.0000 mg | ORAL_TABLET | Freq: Every day | ORAL | Status: DC

## 2011-07-02 MED ORDER — FUROSEMIDE 20 MG PO TABS: 20.0000 mg | ORAL_TABLET | Freq: Every day | ORAL | Status: DC

## 2011-07-02 MED ORDER — SPIRONOLACTONE 25 MG PO TABS: 25.0000 mg | ORAL_TABLET | Freq: Every day | ORAL | Status: DC

## 2011-07-02 MED ORDER — CARVEDILOL 12.5 MG PO TABS: 12.5000 mg | ORAL_TABLET | Freq: Two times a day (BID) | ORAL | Status: DC

## 2011-07-02 NOTE — Telephone Encounter (Signed)
Refilled all medications except xanax, and doxycycline. They should be refilled by pcp

## 2011-07-02 NOTE — Telephone Encounter (Signed)
New Problem:     Patient's wife called needing refills of her husbands ALPRAZolam (XANAX) 1 MG tablet, lisinopril (PRINIVIL,ZESTRIL) 20 MG tablet. spironolactone (ALDACTONE) 25 MG tablet, furosemide (LASIX) 20 MG tablet, doxycycline (VIBRAMYCIN) 100 MG capsule and carvedilol (COREG) 12.5 MG tablet.

## 2011-07-15 ENCOUNTER — Ambulatory Visit: Payer: Medicare Other | Admitting: Cardiology

## 2011-07-15 ENCOUNTER — Other Ambulatory Visit: Payer: Medicare Other

## 2011-07-17 ENCOUNTER — Encounter: Payer: Self-pay | Admitting: Cardiology

## 2011-07-17 ENCOUNTER — Other Ambulatory Visit: Payer: Medicare Other

## 2011-07-17 ENCOUNTER — Ambulatory Visit (INDEPENDENT_AMBULATORY_CARE_PROVIDER_SITE_OTHER): Payer: Medicare Other | Admitting: Cardiology

## 2011-07-17 VITALS — BP 164/107 | HR 68 | Resp 18 | Ht 72.0 in | Wt 162.4 lb

## 2011-07-17 DIAGNOSIS — I428 Other cardiomyopathies: Secondary | ICD-10-CM

## 2011-07-17 DIAGNOSIS — I5022 Chronic systolic (congestive) heart failure: Secondary | ICD-10-CM

## 2011-07-17 DIAGNOSIS — R0602 Shortness of breath: Secondary | ICD-10-CM

## 2011-07-17 DIAGNOSIS — K921 Melena: Secondary | ICD-10-CM

## 2011-07-17 DIAGNOSIS — F17200 Nicotine dependence, unspecified, uncomplicated: Secondary | ICD-10-CM

## 2011-07-17 DIAGNOSIS — I10 Essential (primary) hypertension: Secondary | ICD-10-CM | POA: Diagnosis not present

## 2011-07-17 DIAGNOSIS — R011 Cardiac murmur, unspecified: Secondary | ICD-10-CM

## 2011-07-17 DIAGNOSIS — I509 Heart failure, unspecified: Secondary | ICD-10-CM

## 2011-07-17 LAB — BASIC METABOLIC PANEL
BUN: 13 mg/dL (ref 6–23)
CO2: 29 mEq/L (ref 19–32)
Calcium: 9.1 mg/dL (ref 8.4–10.5)
Chloride: 108 mEq/L (ref 96–112)
Creatinine, Ser: 1.5 mg/dL (ref 0.4–1.5)

## 2011-07-17 LAB — BRAIN NATRIURETIC PEPTIDE: Pro B Natriuretic peptide (BNP): 61 pg/mL (ref 0.0–100.0)

## 2011-07-17 MED ORDER — AMLODIPINE BESYLATE 5 MG PO TABS: 5.0000 mg | ORAL_TABLET | Freq: Every day | ORAL | Status: DC

## 2011-07-17 MED ORDER — HYDRALAZINE HCL 100 MG PO TABS: 100.0000 mg | ORAL_TABLET | Freq: Three times a day (TID) | ORAL | Status: DC

## 2011-07-17 NOTE — Progress Notes (Signed)
PCP: [facility]  41 yo with history of uncontrolled HTN and ETOH abuse for cardiology followup after recent admission with hypertensive emergency and pulmonary edema.  Prior to admission in 3/13, patient was on no medications.  He was drinking 2 pints liquor/day.  He was admitted with severe dyspnea and was found to have BP > 200/100 with pulmonary edema.  He was diuresed and BP was controlled.  Echo showed EF 35-40% with diffuse hypokinesis and moderate to severe mitral regurgitation.  Cardiomyopathy was thought to be most likely due to HTN and ETOH abuse.  Cardiac cath was not done due to elevated creatinine, which was thought to be secondary to uncontrolled BP as well.  Instead, I had him do a Lexiscan myoview in 4/13, which showed EF 37% and no ischemia or infarction.   Since getting home from the hospital, he has been doing better.  He no longer has exertional dyspnea.  No chest pain.  SBP is 164/107 today and is still running high when he checks it at home.  He has been under a lot of stress: his grandmother in [place] died recently and all his property is in her name.  He has completely cut out ETOH.  He is still smoking 2-3 cigarettes/day.  He reports that the tips of his fingers get cold and ache.  He has also noted some bright red blood in his stool.    Labs (3/13): K 4.2, creatinine 1.7, urinary metanephrine and catecholeamine levels normal, LDL 94, HDL 38, pro-BNP 7080 => 2737  PMH: 1. H/o ankle fractures bilaterally 2. GERD 3. HTN: Negative urinary catecholeamine collection for pheochromocytoma.  4. CKD: Suspect hypertensive nephropathy.  5. Cardiomyopathy: Most likely due to heavy ETOH ingestion and uncontrolled HTN.   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  Lexiscan myoview (4/13): EF 37%, global hypokinesis, no ischemic or infarction.   6. ETOH abuse: Quit  3/13.  7. Active smoker. 8. Valvular heart disease: Echo in 3/13 showed moderate to severe eccentric MR and mild to moderate AI with prolapsing left coronary cusp.  9. Headache with Imdur.   SH: On disability.  Married, has 5 biological children and 5 adopted children.  H/o heavy ETOH use, about 2 pints liquor/day until he quit in 3/13.  Smokes but now down to 2-3 cigs/day.    ROS:  All systems reviewed and negative except as per HPI.   FH: No premature CAD.   Current Outpatient Prescriptions  Medication Sig Dispense Refill  . albuterol (PROVENTIL HFA;VENTOLIN HFA) 108 (90 BASE) MCG/ACT inhaler Inhale 1-2 puffs into the lungs every 6 (six) hours as needed for wheezing.  1 Inhaler  0  . ALPRAZolam (XANAX) 1 MG tablet Take 1 mg by mouth at bedtime.      . carvedilol (COREG) 12.5 MG tablet Take 1 tablet (12.5 mg total) by mouth 2 (two) times daily with a meal.  60 tablet  6  . doxycycline (VIBRAMYCIN) 100 MG capsule Take 100 mg by mouth 2 (two) times daily.      . furosemide (LASIX) 20 MG tablet Take 1 tablet (20 mg total) by mouth daily.  30 tablet  6  . hydrALAZINE (APRESOLINE) 100 MG tablet Take 1 tablet (100 mg total) by mouth 3 (three) times daily.  90 tablet  11  . lisinopril (PRINIVIL,ZESTRIL) 20 MG tablet Take 0.5 tablets (10 mg  total) by mouth daily. 1/2 Daily  30 tablet  6  . sertraline (ZOLOFT) 50 MG tablet Take 50 mg by mouth daily.      [name] spironolactone (ALDACTONE) 25 MG tablet Take 1 tablet (25 mg total) by mouth daily.  30 tablet  6  . DISCONTD: hydrALAZINE (APRESOLINE) 50 MG tablet Take 1 tablet (50 mg total) by mouth 3 (three) times daily.  120 tablet  0  . amLODipine (NORVASC) 5 MG tablet Take 1 tablet (5 mg total) by mouth daily.  30 tablet  11    BP 164/107  Pulse 68  Resp 18  Ht 6' (1.829 m)  Wt 162 lb 6.4 oz (73.664 kg)  BMI 22.03 kg/m2 General: NAD Neck: No JVD, no thyromegaly or thyroid nodule.  Lungs: Clear to auscultation bilaterally with normal respiratory  effort. CV: Nondisplaced PMI.  Heart regular S1/S2, no S3/S4, 1/6 HSM, 2/6 diastolic murmur along the sternal border.  No peripheral edema.  No carotid bruit.  Normal pedal pulses.  Abdomen: Soft, nontender, no hepatosplenomegaly, no distention.  Skin: Intact without lesions or rashes.  Neurologic: Alert and oriented x 3.  Psych: Normal affect. Extremities: No clubbing or cyanosis.  HEENT: Normal.

## 2011-07-17 NOTE — Patient Instructions (Signed)
Your physician has recommended you make the following change in your medication:  Stop Aspirin Increase Hydralazine to 100mg  three times a day Start Amlodipine 5 mg daily  You have been referred to a kidney doctor  Your physician recommends that you schedule a follow-up appointment in: 2 weeks with Richardson Dopp PA  Your physician recommends that you schedule a follow-up appointment in: 1 month with Dr. Aundra Dubin

## 2011-07-19 DIAGNOSIS — K921 Melena: Secondary | ICD-10-CM | POA: Insufficient documentation

## 2011-07-19 NOTE — Assessment & Plan Note (Signed)
Some blood in stool.  No family history of early-onset colon cancer that he knows.  Stop aspirin.  If blood in stool persists, will need to see GI.

## 2011-07-19 NOTE — Assessment & Plan Note (Signed)
Patient is symptomatically much improved since hospitalization.  NYHA class II symptoms.  He appears euvolemic on exam.  The most likely causes of the cardiomyopathy are heavy ETOH and uncontrolled HTN.  No cath given CKD but myoview showed no ischemia or infarction.  He has stopped drinking and we are working to control the BP.  - Continue Coreg 12.5 mg bid, lisinopril 10 mg daily, spironolactone 25 mg daily.  - I will increase hydralazine to 100 mg tid given hypertension but he has not been able to tolerate nitrates due to headache.    - Repeat echo in 9/13 after 6 months of medical management.

## 2011-07-19 NOTE — Assessment & Plan Note (Signed)
BP still high.  I think that hypertension plays a major role in his cardiomyopathy and his valvular disease.  I will have him increase hydralazine to 100 mg tid and he will start amlodipine 5 mg qd.  He will followup with the PA in 2 wks.

## 2011-07-19 NOTE — Assessment & Plan Note (Signed)
I counselled the patient to quit smoking.  He wants to try an electronic cigarette.

## 2011-07-19 NOTE — Assessment & Plan Note (Signed)
He has systolic and diastolic murmurs on exam.  He had mild to moderate AI and moderate to severe MR on last echo.  Plan to repeat echo in 9/13 on medical management to lower BP.  Will see if valvular regurgitation improves with controlled BP.

## 2011-07-23 ENCOUNTER — Telehealth: Payer: Self-pay | Admitting: *Deleted

## 2011-07-23 DIAGNOSIS — I120 Hypertensive chronic kidney disease with stage 5 chronic kidney disease or end stage renal disease: Secondary | ICD-10-CM | POA: Diagnosis not present

## 2011-07-23 DIAGNOSIS — N183 Chronic kidney disease, stage 3 unspecified: Secondary | ICD-10-CM | POA: Diagnosis not present

## 2011-07-23 NOTE — Telephone Encounter (Signed)
   Message     Mr. Everts has an appointment on 07/23/11 @ 1:30 with Dr. [name]

## 2011-07-27 ENCOUNTER — Other Ambulatory Visit: Payer: Self-pay | Admitting: Nephrology

## 2011-07-27 DIAGNOSIS — N184 Chronic kidney disease, stage 4 (severe): Secondary | ICD-10-CM

## 2011-07-29 ENCOUNTER — Ambulatory Visit
Admission: RE | Admit: 2011-07-29 | Discharge: 2011-07-29 | Disposition: A | Discharge status: Home / Self Care | Payer: Medicare Other | Source: Ambulatory Visit | Attending: Nephrology | Admitting: Nephrology

## 2011-07-29 DIAGNOSIS — N184 Chronic kidney disease, stage 4 (severe): Secondary | ICD-10-CM

## 2011-07-29 DIAGNOSIS — N189 Chronic kidney disease, unspecified: Secondary | ICD-10-CM | POA: Diagnosis not present

## 2011-07-31 ENCOUNTER — Ambulatory Visit (INDEPENDENT_AMBULATORY_CARE_PROVIDER_SITE_OTHER): Payer: Medicare Other | Admitting: Physician Assistant

## 2011-07-31 ENCOUNTER — Encounter: Payer: Self-pay | Admitting: Gastroenterology

## 2011-07-31 ENCOUNTER — Encounter: Payer: Self-pay | Admitting: Physician Assistant

## 2011-07-31 ENCOUNTER — Telehealth: Payer: Self-pay | Admitting: *Deleted

## 2011-07-31 VITALS — BP 168/104 | HR 73 | Ht 72.0 in | Wt 164.0 lb

## 2011-07-31 DIAGNOSIS — K921 Melena: Secondary | ICD-10-CM

## 2011-07-31 DIAGNOSIS — I5031 Acute diastolic (congestive) heart failure: Secondary | ICD-10-CM

## 2011-07-31 DIAGNOSIS — I10 Essential (primary) hypertension: Secondary | ICD-10-CM | POA: Diagnosis not present

## 2011-07-31 DIAGNOSIS — I359 Nonrheumatic aortic valve disorder, unspecified: Secondary | ICD-10-CM

## 2011-07-31 DIAGNOSIS — I5022 Chronic systolic (congestive) heart failure: Secondary | ICD-10-CM

## 2011-07-31 DIAGNOSIS — N189 Chronic kidney disease, unspecified: Secondary | ICD-10-CM

## 2011-07-31 LAB — CBC WITH DIFFERENTIAL/PLATELET
Basophils Absolute: 0 10*3/uL (ref 0.0–0.1)
Eosinophils Relative: 4.4 % (ref 0.0–5.0)
HCT: 40 % (ref 39.0–52.0)
Lymphs Abs: 1.7 10*3/uL (ref 0.7–4.0)
MCV: 96.9 fl (ref 78.0–100.0)
Monocytes Absolute: 0.5 10*3/uL (ref 0.1–1.0)
Neutrophils Relative %: 46.1 % (ref 43.0–77.0)
Platelets: 170 10*3/uL (ref 150.0–400.0)
RDW: 14.3 % (ref 11.5–14.6)
WBC: 4.6 10*3/uL (ref 4.5–10.5)

## 2011-07-31 MED ORDER — CARVEDILOL 25 MG PO TABS: 25.0000 mg | ORAL_TABLET | Freq: Two times a day (BID) | ORAL | Status: DC

## 2011-07-31 NOTE — Patient Instructions (Addendum)
Your physician has recommended you make the following change in your medication: INCREASE COREG TO 25 MG TWICE DAILY  CBC W/DIFF TODAY HEMATOCHEZIA  PLEASE REFER TO GASTROENTEROLOGY FOR HEMATOCHEZIA  FOLLOW A 2 GRAM SODIUM DIET  PLEASE MAKE APPOINTMENT TO SEE SCOTT WEAVER, PAC 2-3 WEEKS; BUT MAKE SURE TO KEEP APPOINTMENT WITH DR. Aundra Dubin 09/07/11   2 Gram Low Sodium Diet A 2 gram sodium diet restricts the amount of sodium in the diet to no more than 2 g or 2000 mg daily. Limiting the amount of sodium is often used to help lower blood pressure. It is important if you have heart, liver, or kidney problems. Many foods contain sodium for flavor and sometimes as a preservative. When the amount of sodium in a diet needs to be low, it is important to know what to look for when choosing foods and drinks. The following includes some information and guidelines to help make it easier for you to adapt to a low sodium diet. QUICK TIPS  Do not add salt to food.   Avoid convenience items and fast food.   Choose unsalted snack foods.   Buy lower sodium products, often labeled as "lower sodium" or "no salt added."   Check food labels to learn how much sodium is in 1 serving.   When eating at a restaurant, ask that your food be prepared with less salt or none, if possible.  READING FOOD LABELS FOR SODIUM INFORMATION The nutrition facts label is a good place to find how much sodium is in foods. Look for products with no more than 500 to 600 mg of sodium per meal and no more than 150 mg per serving. Remember that 2 g = 2000 mg. The food label may also list foods as:  Sodium-free: Less than 5 mg in a serving.   Very low sodium: 35 mg or less in a serving.   Low-sodium: 140 mg or less in a serving.   Light in sodium: 50% less sodium in a serving. For example, if a food that usually has 300 mg of sodium is changed to become light in sodium, it will have 150 mg of sodium.   Reduced sodium: 25% less  sodium in a serving. For example, if a food that usually has 400 mg of sodium is changed to reduced sodium, it will have 300 mg of sodium.  CHOOSING FOODS Grains  Avoid: Salted crackers and snack items. Some cereals, including instant hot cereals. Bread stuffing and biscuit mixes. Seasoned rice or pasta mixes.   Choose: Unsalted snack items. Low-sodium cereals, oats, puffed wheat and rice, shredded wheat. English muffins and bread. Pasta.  Meats  Avoid: Salted, canned, smoked, spiced, pickled meats, including fish and poultry. Bacon, ham, sausage, cold cuts, hot dogs, anchovies.   Choose: Low-sodium canned tuna and salmon. Fresh or frozen meat, poultry, and fish.  Dairy  Avoid: Processed cheese and spreads. Cottage cheese. Buttermilk and condensed milk. Regular cheese.   Choose: Milk. Low-sodium cottage cheese. Yogurt. Sour cream. Low-sodium cheese.  Fruits and Vegetables  Avoid: Regular canned vegetables. Regular canned tomato sauce and paste. Frozen vegetables in sauces. Olives. Angie Fava. Relishes. Sauerkraut.   Choose: Low-sodium canned vegetables. Low-sodium tomato sauce and paste. Frozen or fresh vegetables. Fresh and frozen fruit.  Condiments  Avoid: Canned and packaged gravies. Worcestershire sauce. Tartar sauce. Barbecue sauce. Soy sauce. Steak sauce. Ketchup. Onion, garlic, and table salt. Meat flavorings and tenderizers.   Choose: Fresh and dried herbs and spices. Low-sodium varieties  of mustard and ketchup. Lemon juice. Tabasco sauce. Horseradish.  SAMPLE 2 GRAM SODIUM MEAL PLAN Breakfast / Sodium (mg)  1 cup low-fat milk / [id number] mg   2 slices whole-wheat toast / 270 mg   1 tbs heart-healthy margarine / 153 mg   1 hard-boiled egg / 139 mg   1 small orange / 0 mg  Lunch / Sodium (mg)  1 cup raw carrots / 76 mg    cup hummus / 298 mg   1 cup low-fat milk / 143 mg    cup red grapes / 2 mg   1 whole-wheat pita bread / 356 mg  Dinner / Sodium (mg)  1 cup  whole-wheat pasta / 2 mg   1 cup low-sodium tomato sauce / 73 mg   3 oz lean ground beef / 57 mg   1 small side salad (1 cup raw spinach leaves,  cup cucumber,  cup yellow bell pepper) with 1 tsp olive oil and 1 tsp red wine vinegar / 25 mg  Snack / Sodium (mg)  1 container low-fat vanilla yogurt / 107 mg   3 graham cracker squares / 127 mg  Nutrient Analysis  Calories: 2033   Protein: 77 g   Carbohydrate: 282 g   Fat: 72 g   Sodium: 1971 mg  Document Released: 03/02/2005 Document Revised: 02/19/2011 Document Reviewed: 06/03/2009 [facility] Patient Information 2012 [place], [place].

## 2011-07-31 NOTE — Progress Notes (Signed)
[place], [place]  [address] Phone: [phone] Fax:  [phone]  Date:  07/31/2011   Name:  [name]   DOB:  [birth year]   MRN:  [id number]  PCP:  Pcp Not In System - Dr. Melony Overly at [facility]  Primary Cardiologist:  Dr. [name]  Primary Electrophysiologist:  None    History of Present Illness: [name] is a 41 y.o. male who returns for follow up on hypertension.  He is a prior patient of mine at [facility].  He has a h/o of uncontrolled HTN and ETOH.  Admitted 05/2010 with hypertensive emergency and pulmonary edema. Prior to admission, he was on no medications and was drinking 2 pints liquor/day.  BP was > 200/100 with pulmonary edema. He was diuresed and BP was controlled. Echo:  EF 35-40% with diffuse hypokinesis and moderate to severe mitral regurgitation. Cardiomyopathy was thought to be most likely due to HTN and ETOH abuse. Cardiac cath was not done due to renal insufficiency, which was thought to be secondary to uncontrolled BP as well.  Lexiscan myoview in 4/13:  EF 37% and no ischemia or infarction.   Seen by Dr. [name] 5/5 in f/u.  At that time he had completely stopped ETOH.  He was still smoking 2-3 cigarettes/day.  He noted some bright red blood in his stool.  ASA was stopped.  Patient admits to compliance with medications.  Still smoking a couple cigs per day.  No ETOH.  Watching his salt.  But, has a large cup with [facility] in it in the office.  We discussed limiting caffeine and salt.  The patient denies chest pain, shortness of breath, syncope, orthopnea, PND or significant pedal edema.   Past Medical History: 1. H/o ankle fractures bilaterally  2. GERD  3. HTN: Negative urinary catecholeamine collection for pheochromocytoma.  4. CKD: Suspect hypertensive nephropathy.  5. Cardiomyopathy: Most likely due to heavy ETOH ingestion and uncontrolled HTN. Echo (3/13) with EF 35-40%, diffuse HK, mild LVH, moderate  to severe eccentric MR, mild to moderate AI with left coronary cusp prolapse, PA systolic pressure 38 mmHg. Lexiscan myoview (4/13): EF 37%, global hypokinesis, no ischemic or infarction.  6. ETOH abuse: Quit 3/13.  7. Active smoker.  8. Valvular heart disease: Echo in 3/13 showed moderate to severe eccentric MR and mild to moderate AI with prolapsing left coronary cusp.  9. Headache with Imdur.    Wt Readings from Last 3 Encounters:  07/31/11 164 lb (74.39 kg)  07/17/11 162 lb 6.4 oz (73.664 kg)  06/17/11 157 lb (71.215 kg)    Labs (3/13): K 4.2, creatinine 1.7, urinary metanephrine and catecholeamine levels normal, LDL 94, HDL 38, pro-BNP 7080 => 2737   Potassium  Date/Time Value Range Status  07/17/2011  9:57 AM 4.6  3.5-5.1 (mEq/L) Final     Creatinine, Ser  Date/Time Value Range Status  07/17/2011  9:57 AM 1.5  0.4-1.5 (mg/dL) Final     Current Outpatient Prescriptions  Medication Sig Dispense Refill  . albuterol (PROVENTIL HFA;VENTOLIN HFA) 108 (90 BASE) MCG/ACT inhaler Inhale 1-2 puffs into the lungs every 6 (six) hours as needed for wheezing.  1 Inhaler  0  . ALPRAZolam (XANAX) 1 MG tablet Take 1 mg by mouth at bedtime.      [name] amLODipine (NORVASC) 5 MG tablet Take 1 tablet (5 mg total) by mouth daily.  30 tablet  11  . carvedilol (COREG) 12.5 MG tablet Take  1 tablet (12.5 mg total) by mouth 2 (two) times daily with a meal.  60 tablet  6  . doxycycline (VIBRAMYCIN) 100 MG capsule Take 100 mg by mouth 2 (two) times daily.      . furosemide (LASIX) 20 MG tablet Take 1 tablet (20 mg total) by mouth daily.  30 tablet  6  . hydrALAZINE (APRESOLINE) 100 MG tablet Take 1 tablet (100 mg total) by mouth 3 (three) times daily.  90 tablet  11  . lisinopril (PRINIVIL,ZESTRIL) 20 MG tablet Take 0.5 tablets (10 mg total) by mouth daily. 1/2 Daily  30 tablet  6  . sertraline (ZOLOFT) 50 MG tablet Take 50 mg by mouth daily.      [name] spironolactone (ALDACTONE) 25 MG tablet Take 1 tablet (25 mg  total) by mouth daily.  30 tablet  6    Allergies: No Known Allergies   History  Substance Use Topics  . Smoking status: Current Some Day Smoker -- 0.2 packs/day for 20 years    Types: Cigarettes  . Smokeless tobacco: Never Used  . Alcohol Use: No     used to drink 1 pint of gin a week    Family History  Problem Relation Age of Onset  . Hypertension    . Colon cancer Paternal Uncle     ROS:  Please see the history of present illness.   Still notes blood in stool.  Describes what sounds like maroon colored blood.  No pain.  It seems to be resolving, but still present.   All other systems reviewed and negative.   PHYSICAL EXAM: VS:  BP 168/104  Pulse 73  Ht 6' (1.829 m)  Wt 164 lb (74.39 kg)  BMI 22.24 kg/m2 Well nourished, well developed, in no acute distress HEENT: normal Neck: no JVD Cardiac:  normal S1, S2; RRR; no murmur Lungs:  clear to auscultation bilaterally, no wheezing, rhonchi or rales Abd: soft, nontender, no hepatomegaly Ext: no edema Skin: warm and dry Neuro:  CNs 2-12 intact, no focal abnormalities noted  EKG:  NSR, HR 73, normal axis, LVH with repol abnormality    ASSESSMENT AND PLAN:  1.  Hypertension   -  Still uncontrolled.   -  Increase Carvedilol to 25 mg bid.   -  Follow up with me in 2-3 weeks.  Keep f/u with Dr. [name] on 6/24.   -  I will give him a 2 gm Na diet to follow  2.  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   -  Continue beta blocker, ACE, hydralazine, furosemide.     -  He is intol to nitrates due to headache   -  Follow up as above.  3.  Valvular Heart Disease (Mod to Severe MR and Mild to Mod AI)   -  Follow up echo planned in 11/2011  4.  Chronic Kidney Disease   -  He states he saw Nephrology yesterday and had a renal u/s   -  Creatinine stable at last check  5.  Hematochezia   -  No first degree relative with colon CA, but his uncle did as well  as his cousin.   -  He stopped ASA but has continued to note dark colored blood   -  Check CBC   -  Refer to GI.  6.  Tobacco Abuse   -  He is still working on quitting.  Danton Sewer, PA-C  9:44 AM 07/31/2011

## 2011-07-31 NOTE — Telephone Encounter (Signed)
lvm with family member lab ok, pt not anemic

## 2011-07-31 NOTE — Telephone Encounter (Signed)
Message copied by Michae Kava on Fri Jul 31, 2011  2:28 PM ------      Message from: [place], [place] T      Created: Fri Jul 31, 2011  1:31 PM       No anemia      Richardson Dopp, PA-C  1:31 PM 07/31/2011

## 2011-08-19 ENCOUNTER — Ambulatory Visit: Payer: Medicare Other | Admitting: Gastroenterology

## 2011-08-21 ENCOUNTER — Ambulatory Visit: Payer: Medicare Other | Admitting: Physician Assistant

## 2011-08-21 ENCOUNTER — Encounter: Payer: Self-pay | Admitting: Physician Assistant

## 2011-08-21 ENCOUNTER — Ambulatory Visit (INDEPENDENT_AMBULATORY_CARE_PROVIDER_SITE_OTHER): Payer: Medicare Other | Admitting: Physician Assistant

## 2011-08-21 VITALS — BP 146/100 | HR 68 | Ht 72.0 in | Wt 161.0 lb

## 2011-08-21 DIAGNOSIS — I38 Endocarditis, valve unspecified: Secondary | ICD-10-CM

## 2011-08-21 DIAGNOSIS — I5022 Chronic systolic (congestive) heart failure: Secondary | ICD-10-CM | POA: Diagnosis not present

## 2011-08-21 DIAGNOSIS — I10 Essential (primary) hypertension: Secondary | ICD-10-CM | POA: Diagnosis not present

## 2011-08-21 DIAGNOSIS — K921 Melena: Secondary | ICD-10-CM

## 2011-08-21 DIAGNOSIS — N189 Chronic kidney disease, unspecified: Secondary | ICD-10-CM | POA: Diagnosis not present

## 2011-08-21 MED ORDER — AMLODIPINE BESYLATE 5 MG PO TABS: ORAL_TABLET | ORAL | Status: DC

## 2011-08-21 NOTE — Patient Instructions (Signed)
Your physician recommends that you schedule a follow-up appointment in:  09/07/11 with Dr. Aundra Dubin. Your physician has recommended you make the following change in your medication: Lasix 20 mg take two tablets by mouth in AM Increase Norvasc to 10 mg daily. Take 2 / 5 mg tablet daily.

## 2011-08-21 NOTE — Progress Notes (Signed)
[name], [place]  [address] Phone: [phone] Fax:  [phone]  Date:  08/21/2011   Name:  [name]   DOB:  02/10/1971   MRN:  [id number]  PCP:  Pcp Not In System - Dr. Melony Overly at [facility]  Primary Cardiologist:  Dr. [name]  Primary Electrophysiologist:  None    History of Present Illness: [name] is a 41 y.o. male who returns for follow up on hypertension.  He is a prior patient of mine at [facility].  He has a h/o of uncontrolled HTN and ETOH.  Admitted 05/2010 with hypertensive emergency and pulmonary edema. Prior to admission, he was on no medications and was drinking 2 pints liquor/day.  BP was > 200/100 with pulmonary edema. He was diuresed and BP was controlled. Echo:  EF 35-40% with diffuse hypokinesis and moderate to severe mitral regurgitation. Cardiomyopathy was thought to be most likely due to HTN and ETOH abuse. Cardiac cath was not done due to renal insufficiency, which was thought to be secondary to uncontrolled BP as well.  Lexiscan myoview in 4/13: EF 37% and no ischemia or infarction.   He returns for further management of his blood pressure.  He is compliant with his medications.  At last visit, I increased his carvedilol.  The patient denies chest pain, shortness of breath, syncope, orthopnea, PND or significant pedal edema.    Wt Readings from Last 3 Encounters:  08/21/11 161 lb (73.029 kg)  07/31/11 164 lb (74.39 kg)  07/17/11 162 lb 6.4 oz (73.664 kg)    Labs (3/13): K 4.2, creatinine 1.7, urinary metanephrine and catecholeamine levels normal, LDL 94, HDL 38, pro-BNP 7080 => 2737   Potassium  Date/Time Value Range Status  07/17/2011  9:57 AM 4.6  3.5-5.1 (mEq/L) Final     Creatinine, Ser  Date/Time Value Range Status  07/17/2011  9:57 AM 1.5  0.4-1.5 (mg/dL) Final    Past Medical History: 1. H/o ankle fractures bilaterally  2. GERD  3. HTN: Negative urinary catecholeamine collection for  pheochromocytoma.  4. CKD: Suspect hypertensive nephropathy.  5. Cardiomyopathy: Most likely due to heavy ETOH ingestion and uncontrolled HTN. 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 Lexiscan myoview (4/13): EF 37%, global hypokinesis, no ischemic or infarction.  6. ETOH abuse: Quit 3/13.  7. Active smoker.  8. Valvular heart disease: Echo in 3/13 showed moderate to severe eccentric MR and mild to moderate AI with prolapsing left coronary cusp.  9. Headache with Imdur.    Current Outpatient Prescriptions  Medication Sig Dispense Refill  . albuterol (PROVENTIL HFA;VENTOLIN HFA) 108 (90 BASE) MCG/ACT inhaler Inhale 1-2 puffs into the lungs every 6 (six) hours as needed for wheezing.  1 Inhaler  0  . ALPRAZolam (XANAX) 1 MG tablet Take 1 mg by mouth at bedtime.      [name] amLODipine (NORVASC) 5 MG tablet Take 1 tablet (5 mg total) by mouth daily.  30 tablet  11  . carvedilol (COREG) 25 MG tablet Take 1 tablet (25 mg total) by mouth 2 (two) times daily with a meal.  60 tablet  6  . furosemide (LASIX) 20 MG tablet Take 40 mg by mouth daily.      . hydrALAZINE (APRESOLINE) 100 MG tablet Take 1 tablet (100 mg total) by mouth 3 (three) times daily.  90 tablet  11  . lisinopril (PRINIVIL,ZESTRIL) 20 MG tablet Take 0.5 tablets (10 mg total) by mouth daily. 1/2 Daily  30 tablet  6  . sertraline (ZOLOFT) 50 MG tablet Take 50 mg by mouth daily.      [name] spironolactone (ALDACTONE) 25 MG tablet Take 1 tablet (25 mg total) by mouth daily.  30 tablet  6  . DISCONTD: furosemide (LASIX) 20 MG tablet Take 1 tablet (20 mg total) by mouth daily.  30 tablet  6    Allergies: No Known Allergies   History  Substance Use Topics  . Smoking status: Current Some Day Smoker -- 0.2 packs/day for 20 years    Types: Cigarettes  . Smokeless tobacco: Never Used  . Alcohol Use: No     used to drink 1  pint of gin a week      PHYSICAL EXAM: VS:  BP 146/100  Pulse 68  Ht 6' (1.829 m)  Wt 161 lb (73.029 kg)  BMI 21.84 kg/m2 Well nourished, well developed, in no acute distress HEENT: normal Neck: no JVD Cardiac:  normal S1, S2; RRR; 1/6 systolic murmur and 2/6 diastolic along the left sternal border Lungs:  clear to auscultation bilaterally, no wheezing, rhonchi or rales Abd: soft, nontender, no hepatomegaly Ext: no edema Skin: warm and dry Neuro:  CNs 2-12 intact, no focal abnormalities noted  EKG:  NSR, HR 60, normal axis, LVH with repol abnormality    ASSESSMENT AND PLAN:  1.  Hypertension   -  Still uncontrolled, but improved.   -  Increase Norvasc to 10 mg QD.   -  Follow up with Dr. [name] on 6/24.   -  Consider increasing ACE if BP remains up.  2.  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   -  Continue beta blocker, ACE, hydralazine, furosemide.     -  He is intol to nitrates due to headache   -  Follow up as above.  3.  Valvular Heart Disease (Mod to Severe MR and Mild to Mod AI)   -  Follow up echo planned in 11/2011  4.  Chronic Kidney Disease   -  Followed by Nephrology    -  Creatinine stable at last check  5.  Hematochezia   -  No first degree relative with colon CA, but his uncle did as well as his cousin.   -  CBC was ok at last visit   -  He has an appt with GI.   Signed, Richardson Dopp, PA-C  10:53 AM 08/21/2011

## 2011-08-25 NOTE — Progress Notes (Signed)
Addended by: Janne Napoleon on: 08/25/2011 03:59 PM   Modules accepted: Orders

## 2011-09-02 ENCOUNTER — Other Ambulatory Visit: Payer: Self-pay | Admitting: *Deleted

## 2011-09-02 MED ORDER — AMLODIPINE BESYLATE 10 MG PO TABS: 10.0000 mg | ORAL_TABLET | Freq: Every day | ORAL | Status: DC

## 2011-09-04 ENCOUNTER — Encounter (HOSPITAL_COMMUNITY): Payer: Self-pay | Admitting: Emergency Medicine

## 2011-09-04 ENCOUNTER — Emergency Department (HOSPITAL_COMMUNITY)
Admission: EM | Admit: 2011-09-04 | Discharge: 2011-09-04 | Disposition: A | Discharge status: Home / Self Care | Payer: Medicare Other | Attending: Emergency Medicine | Admitting: Emergency Medicine

## 2011-09-04 ENCOUNTER — Other Ambulatory Visit: Payer: Self-pay

## 2011-09-04 DIAGNOSIS — M62838 Other muscle spasm: Secondary | ICD-10-CM

## 2011-09-04 DIAGNOSIS — R011 Cardiac murmur, unspecified: Secondary | ICD-10-CM | POA: Insufficient documentation

## 2011-09-04 DIAGNOSIS — N189 Chronic kidney disease, unspecified: Secondary | ICD-10-CM | POA: Diagnosis not present

## 2011-09-04 DIAGNOSIS — K219 Gastro-esophageal reflux disease without esophagitis: Secondary | ICD-10-CM | POA: Insufficient documentation

## 2011-09-04 DIAGNOSIS — I129 Hypertensive chronic kidney disease with stage 1 through stage 4 chronic kidney disease, or unspecified chronic kidney disease: Secondary | ICD-10-CM | POA: Diagnosis not present

## 2011-09-04 DIAGNOSIS — Z800 Family history of malignant neoplasm of digestive organs: Secondary | ICD-10-CM | POA: Insufficient documentation

## 2011-09-04 DIAGNOSIS — I509 Heart failure, unspecified: Secondary | ICD-10-CM | POA: Insufficient documentation

## 2011-09-04 DIAGNOSIS — R071 Chest pain on breathing: Secondary | ICD-10-CM | POA: Diagnosis not present

## 2011-09-04 DIAGNOSIS — E785 Hyperlipidemia, unspecified: Secondary | ICD-10-CM | POA: Diagnosis not present

## 2011-09-04 DIAGNOSIS — F17200 Nicotine dependence, unspecified, uncomplicated: Secondary | ICD-10-CM | POA: Diagnosis not present

## 2011-09-04 DIAGNOSIS — R0789 Other chest pain: Secondary | ICD-10-CM | POA: Diagnosis not present

## 2011-09-04 DIAGNOSIS — F411 Generalized anxiety disorder: Secondary | ICD-10-CM | POA: Insufficient documentation

## 2011-09-04 DIAGNOSIS — M542 Cervicalgia: Secondary | ICD-10-CM | POA: Diagnosis not present

## 2011-09-04 MED ORDER — DIAZEPAM 5 MG PO TABS
5.0000 mg | ORAL_TABLET | Freq: Once | ORAL | Status: AC
  Administered 2011-09-04: 5 mg via ORAL
  Filled 2011-09-04: qty 1

## 2011-09-04 MED ORDER — HYDROMORPHONE HCL PF 1 MG/ML IJ SOLN
1.0000 mg | Freq: Once | INTRAMUSCULAR | Status: AC
  Administered 2011-09-04: 1 mg via INTRAVENOUS
  Filled 2011-09-04: qty 1

## 2011-09-04 MED ORDER — HYDROCODONE-ACETAMINOPHEN 5-325 MG PO TABS: ORAL_TABLET | ORAL | Status: AC

## 2011-09-04 MED ORDER — DIAZEPAM 5 MG PO TABS: 5.0000 mg | ORAL_TABLET | Freq: Three times a day (TID) | ORAL | Status: AC | PRN

## 2011-09-04 NOTE — ED Provider Notes (Addendum)
History     CSN: [id number]  Arrival date & time 09/04/11  [id number]   First MD Initiated Contact with Patient 09/04/11 [phone]      Chief Complaint  Patient presents with  . Chest Pain    (Consider location/radiation/quality/duration/timing/severity/associated sxs/prior treatment) HPI Comments: Patient reports pain across his right chest and into his right shoulder and down his right arm. 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 This occurred several days ago which is why he did not recall this event. Symptoms began about 4 days ago and seems to be somewhat worse despite him taking aspirin. He does have a significant history of congestive heart failure, cardiomyopathy and poorly controlled hypertension for which he is seeing the [facility] cardiology. He denies shortness of breath, no exertional worsening. He denies nausea, sweats, vomiting. He denies numbness and no weakness to his hand. He denies any direct trauma to his neck, back, shoulder. He denies pleurisy.  The history is provided by the patient.    Past Medical History  Diagnosis Date  . Hypertension     Hx of HTN urgency secondary to noncompliance  . Anxiety   . GERD (gastroesophageal reflux disease)   . Acute renal failure     June 2012 felt secondary to Toradol  . Chronic renal failure   . CHF (congestive heart failure)     EF 35% 05/2011  . Cardiomegaly - hypertensive   . Smoker   . Acute diastolic heart failure [id number]  . AORTIC REGURGITATION, MILD 03/11/2009    Qualifier: Diagnosis of  By: Jorene Minors, Scott    . HYPERLIPIDEMIA 08/26/2009    Qualifier: Diagnosis of  By: Jorene Minors, Scott    . INGUINAL HERNIA 02/18/2009    Qualifier: Diagnosis of  By: Jorene Minors, Scott    . MURMUR 01/16/2009    Qualifier: Diagnosis of  By: Versie Starks      Past Surgical History  Procedure Date  . Hernia repair   . Ankle  surgery     Family History  Problem Relation Age of Onset  . Hypertension    . Colon cancer Paternal Uncle     History  Substance Use Topics  . Smoking status: Current Some Day Smoker -- 0.2 packs/day for 20 years    Types: Cigarettes  . Smokeless tobacco: Never Used  . Alcohol Use: No     used to drink 1 pint of gin a week      Review of Systems  Constitutional: Negative for fever, chills and diaphoresis.  HENT: Positive for neck pain. Negative for neck stiffness.   Eyes: Negative for visual disturbance.  Respiratory: Negative for cough and shortness of breath.   Cardiovascular: Positive for chest pain. Negative for palpitations and leg swelling.  Gastrointestinal: Negative for nausea, vomiting and abdominal pain.  Musculoskeletal: Positive for arthralgias. Negative for back pain.  Skin: Negative for color change, rash and wound.  Neurological: Negative for dizziness, weakness, numbness and headaches.  All other systems reviewed and are negative.    Allergies  Review of patient's allergies indicates no known allergies.  Home Medications   Current Outpatient Rx  Name Route Sig Dispense Refill  . ALBUTEROL SULFATE HFA 108 (90 BASE) MCG/ACT IN AERS Inhalation Inhale 2 puffs into the lungs every 6 (six) hours as needed. For shortness of breath    .  ALPRAZOLAM 1 MG PO TABS Oral Take 1 mg by mouth at bedtime as needed. For anxiety/sleep    . AMLODIPINE BESYLATE 10 MG PO TABS Oral Take 10 mg by mouth daily.    Cleda Mccreedy STRENGTH PO Oral Take 1 packet by mouth daily as needed. For pain    . CARVEDILOL 12.5 MG PO TABS Oral Take 12.5 mg by mouth 2 (two) times daily with a meal.    . FUROSEMIDE 20 MG PO TABS Oral Take 20 mg by mouth daily.     [name] HYDRALAZINE HCL 100 MG PO TABS Oral Take 100 mg by mouth 3 (three) times daily.    . IBUPROFEN 800 MG PO TABS Oral Take 800 mg by mouth 2 (two) times daily as needed. For pain    . LISINOPRIL 20 MG PO TABS Oral Take 20 mg by mouth  daily.    . SERTRALINE HCL 50 MG PO TABS Oral Take 50 mg by mouth daily.    [name] SPIRONOLACTONE 25 MG PO TABS Oral Take 25 mg by mouth daily.    [name] DIAZEPAM 5 MG PO TABS Oral Take 1 tablet (5 mg total) by mouth every 8 (eight) hours as needed for anxiety. 12 tablet 0  . HYDROCODONE-ACETAMINOPHEN 5-325 MG PO TABS  1-2 tablets po q 6 hours prn moderate to severe pain 20 tablet 0    BP 179/125  Pulse 85  Temp 98.5 F (36.9 C) (Oral)  Resp 16  SpO2 99%  Physical Exam  Nursing note and vitals reviewed. Constitutional: He appears well-developed and well-nourished. No distress.  HENT:  Head: Normocephalic and atraumatic.  Eyes: Pupils are equal, round, and reactive to light.  Neck: Trachea normal, normal range of motion, full passive range of motion without pain and phonation normal. Neck supple. Normal carotid pulses present. Muscular tenderness present. No spinous process tenderness present. Carotid bruit is not present. No tracheal deviation present.    Cardiovascular: Normal rate and regular rhythm.   Pulmonary/Chest: Effort normal and breath sounds normal. No respiratory distress. He has no wheezes. He exhibits tenderness. He exhibits no bony tenderness, no laceration, no crepitus and no edema.    Abdominal: Soft.  Musculoskeletal:       Right shoulder: He exhibits decreased range of motion, tenderness and spasm. He exhibits no bony tenderness, no swelling, no effusion, no crepitus, no deformity, no laceration, no pain, normal pulse and normal strength.       Right elbow: He exhibits normal range of motion, no swelling and no deformity. tenderness found.       Right upper arm: He exhibits tenderness. He exhibits no bony tenderness, no swelling, no edema, no deformity and no laceration.  Neurological: He is alert.  Skin: Skin is warm and dry. He is not diaphoretic.    ED Course  Procedures (including critical care time)  Labs Reviewed - No data to display No results found.   1.  Muscle spasm   2. Chest wall pain     ECG at time 0831 shows by my interpretation NSR at rate 80, normal axis, LVH by voltage with repol abn.  No sig change from ECG on 05/23/11.    MDM  Very clearly muscular tenderness, spasm and pain.  No concern for ACS.  I reviewed prior [place] cardiology notes, has no h/o ischemic disease, just global hypokinesis likely due to HTN and alcohol use.  Will give analgesics, hold off on toradol due to baseline mild renal insuff.  Pt reassured.  Needs follow up with PCP or sports medicine.          Saddie Benders. Dorna Mai, MD 09/04/11 [place] Dorna Mai, MD 09/04/11 [phone]

## 2011-09-04 NOTE — Discharge Instructions (Signed)
Narcotic and benzodiazepine use may cause drowsiness, slowed breathing or dependence.  Please use with caution and do not drive, operate machinery or watch young children alone while taking them.  Taking combinations of these medications or drinking alcohol will potentiate these effects.    

## 2011-09-04 NOTE — ED Notes (Signed)
Cp started 4 days ago  Pain goes across vhest from left to rt w/ pain  Worse being under rt arm  Making his whole arm and hand ach . No injury , or over use he states no n/v/sob. Took a goodie powder.bp has been up. Has been seeing [place] [facility] for his bp  Has not been taking his meds the way he should

## 2011-09-07 ENCOUNTER — Ambulatory Visit: Payer: Medicare Other | Admitting: Cardiology

## 2011-09-26 IMAGING — CT CT HEAD W/O CM
1 series · 16 of 30 positions shown, 20 images · non-contrast
Comparison: 04/06/2005

CLINICAL DATA: Headache.

CT HEAD WITHOUT CONTRAST
TECHNIQUE: Contiguous axial images were obtained from the base of
the skull through the vertex without contrast.

[Series 2: head_seq 4.5 h37s st · axial · 0.43mm/px · z∈[+1025,+1169]mm · 16 of 36 slices shown, 20 images]
[im 2/36  brain]
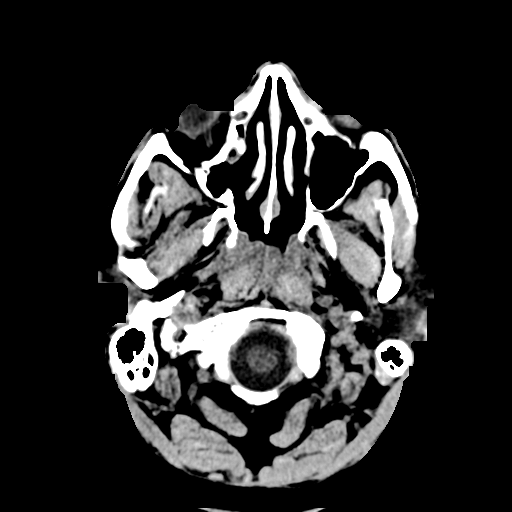
[im 2/36  bone]
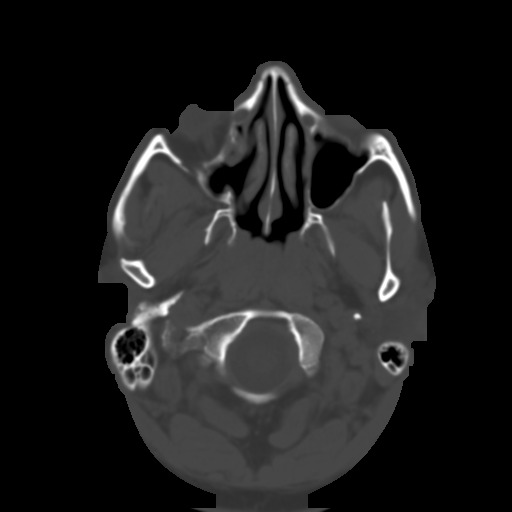
[im 4/36  brain]
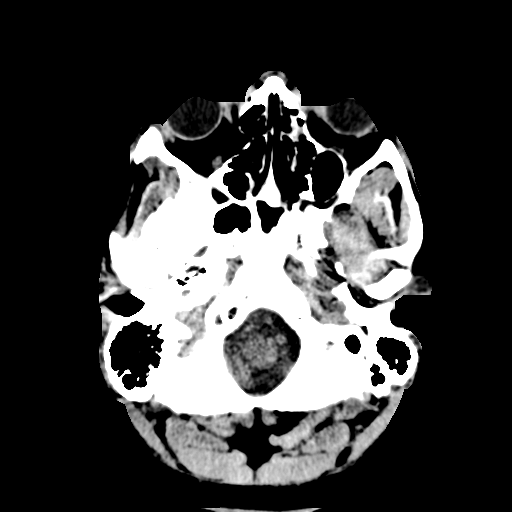
[im 7/36  brain]
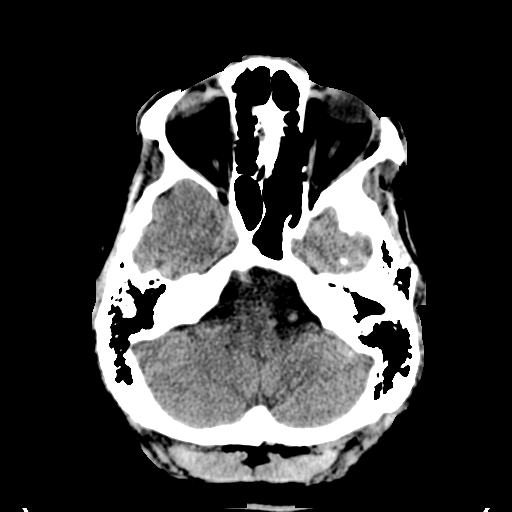
[im 9/36  brain]
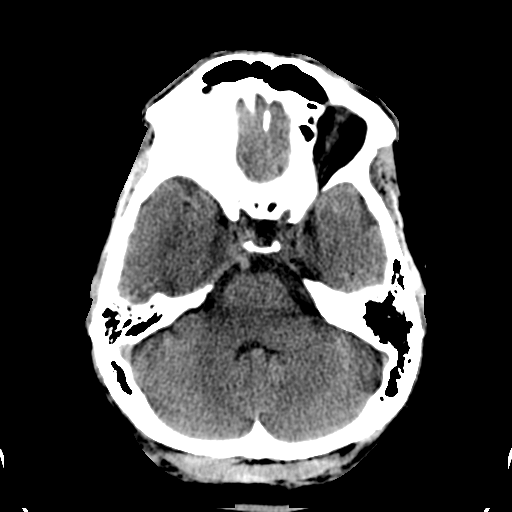
[im 10/36  brain]
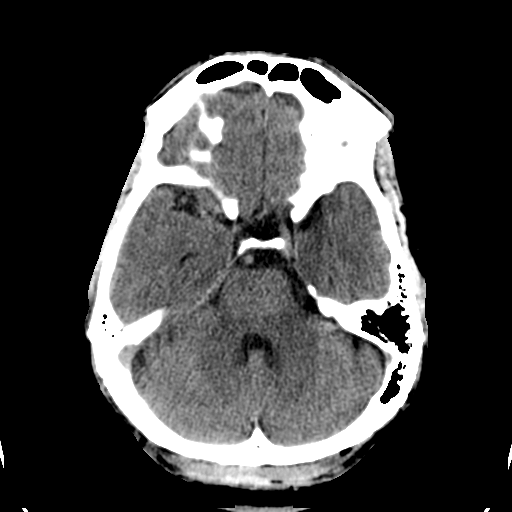
[im 10/36  bone]
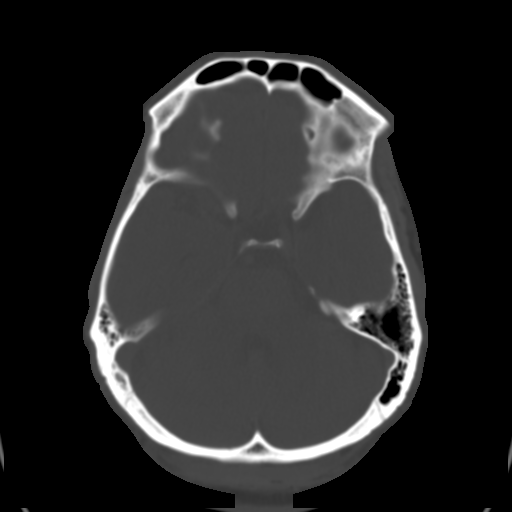
[im 13/36  brain]
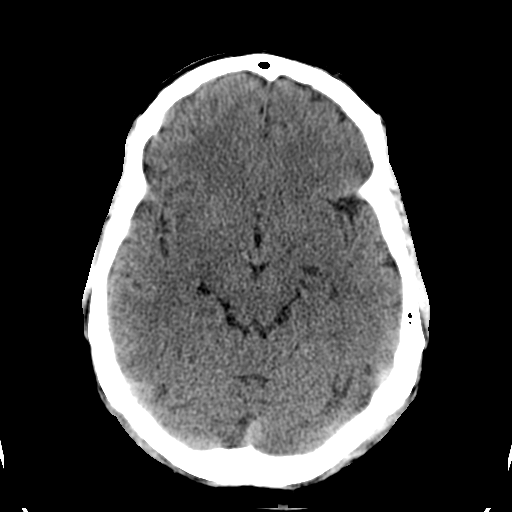
[im 15/36  brain]
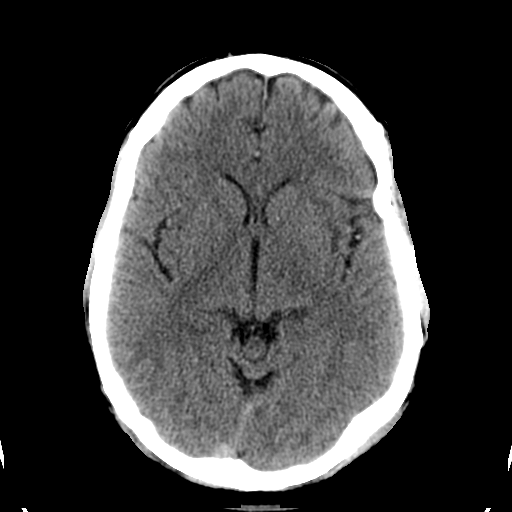
[im 17/36  brain]
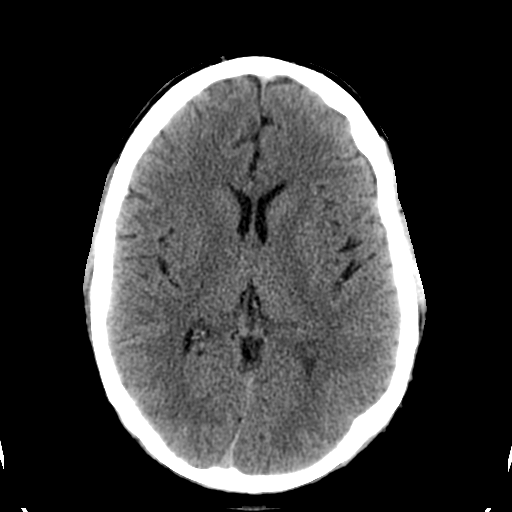
[im 19/36  brain]
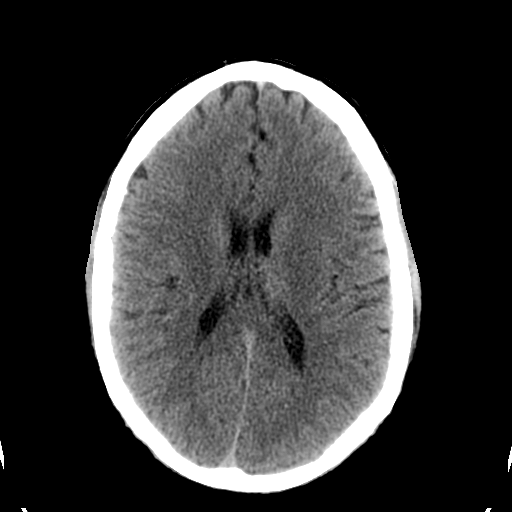
[im 19/36  bone]
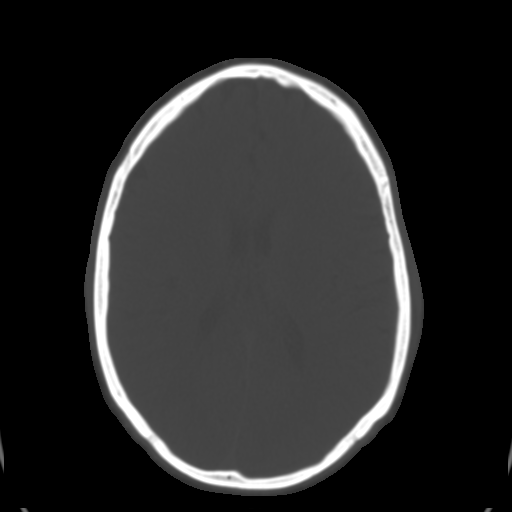
[im 21/36  brain]
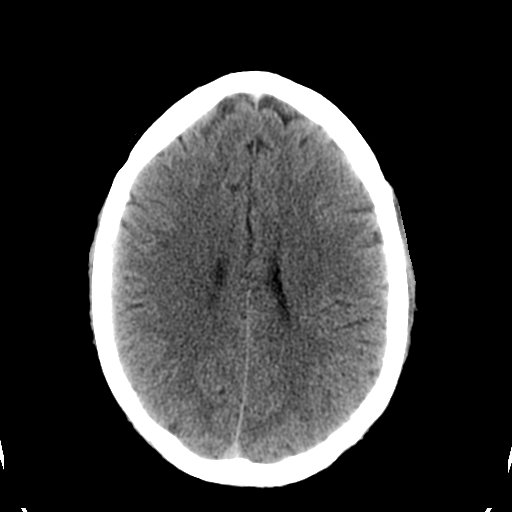
[im 23/36  brain]
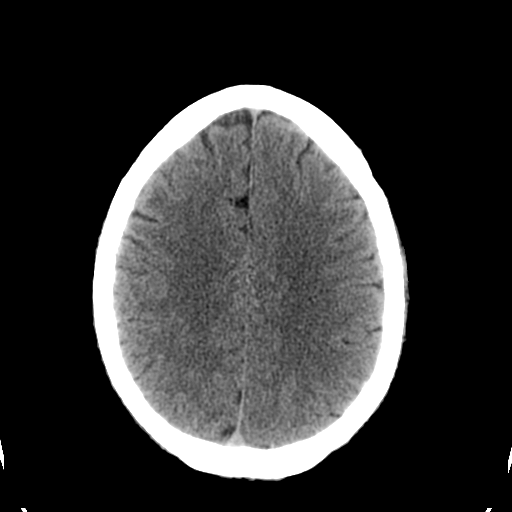
[im 26/36  brain]
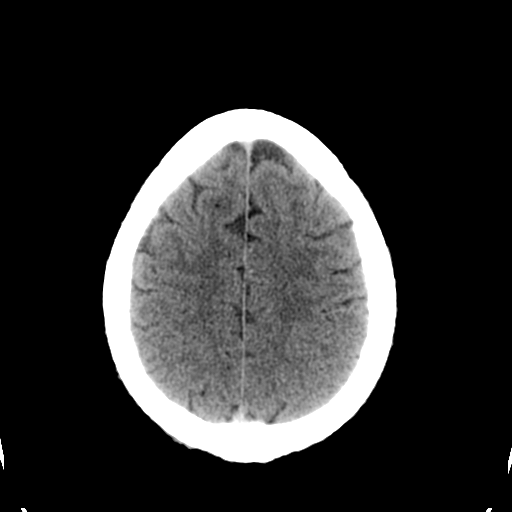
[im 27/36  brain]
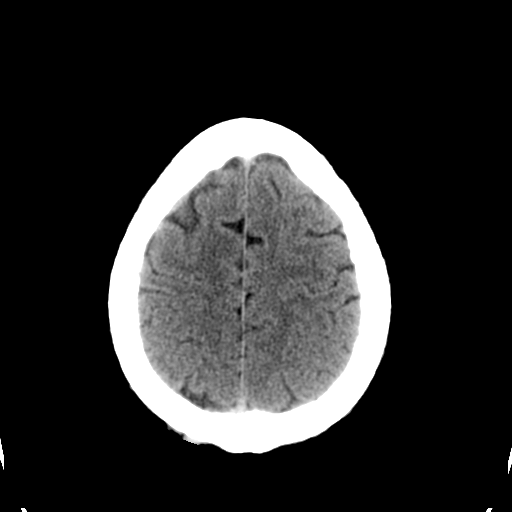
[im 27/36  bone]
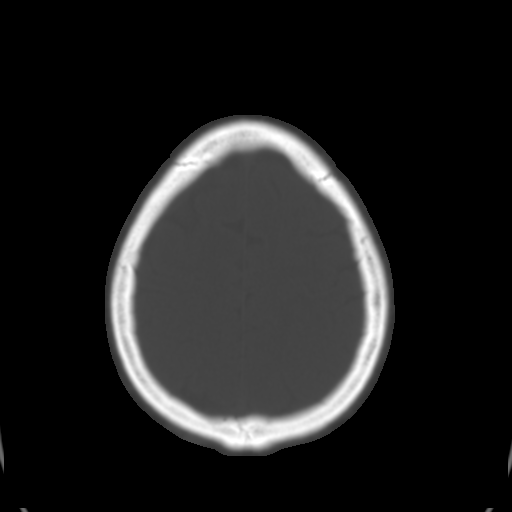
[im 29/36  brain]
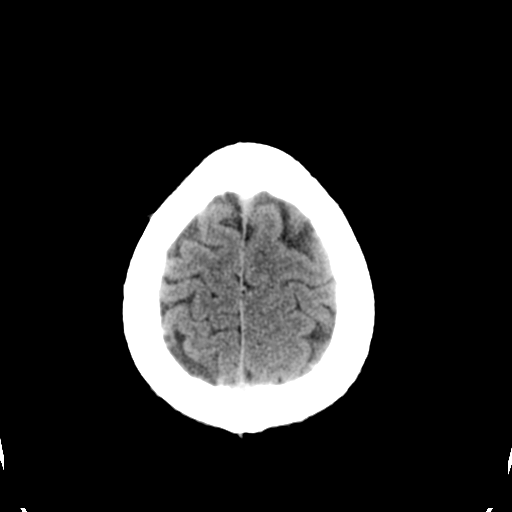
[im 32/36  brain]
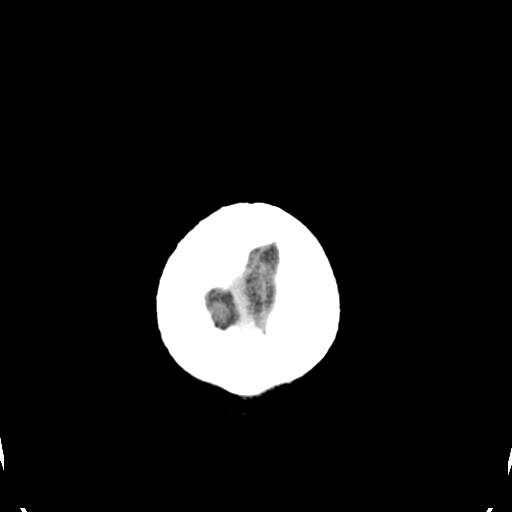
[im 34/36  brain]
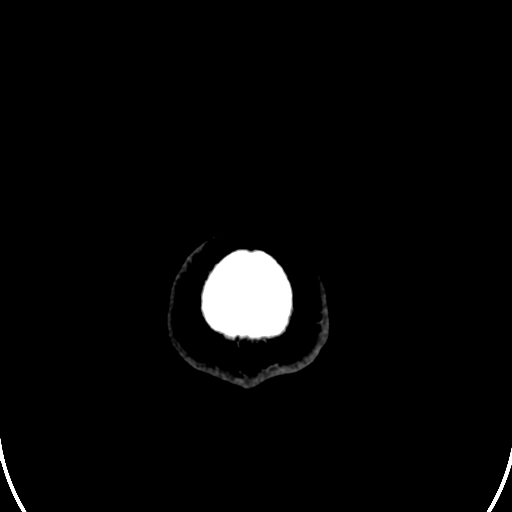

[16 of 30 positions shown; findings below may reference images not displayed]

FINDINGS: No acute intracranial abnormalities are identified,
including mass lesion or mass effect, hydrocephalus, extra-axial
fluid collection, midline shift, hemorrhage, or acute infarction.
Please note that acute infarction may be occult on CT for 24-48
hours.

Evidence of prior medial right orbit fracture again noted.
Mild mucosal thickening of a few anterior left ethmoid air cells
are again noted.
IMPRESSION: No evidence of acute intracranial abnormality.

Mild chronic left ethmoid sinus disease.

## 2011-10-20 ENCOUNTER — Encounter (HOSPITAL_COMMUNITY): Payer: Self-pay

## 2011-10-20 ENCOUNTER — Emergency Department (HOSPITAL_COMMUNITY)
Admission: EM | Admit: 2011-10-20 | Discharge: 2011-10-20 | Disposition: A | Discharge status: Home / Self Care | Payer: Medicare Other | Attending: Emergency Medicine | Admitting: Emergency Medicine

## 2011-10-20 DIAGNOSIS — K625 Hemorrhage of anus and rectum: Secondary | ICD-10-CM | POA: Insufficient documentation

## 2011-10-20 LAB — CBC WITH DIFFERENTIAL/PLATELET
Basophils Relative: 1 % (ref 0–1)
HCT: 36.6 % — ABNORMAL LOW (ref 39.0–52.0)
Hemoglobin: 12.4 g/dL — ABNORMAL LOW (ref 13.0–17.0)
Lymphocytes Relative: 39 % (ref 12–46)
Lymphs Abs: 1.7 10*3/uL (ref 0.7–4.0)
Monocytes Relative: 8 % (ref 3–12)
Neutro Abs: 2.1 10*3/uL (ref 1.7–7.7)
Neutrophils Relative %: 47 % (ref 43–77)
RBC: 3.96 MIL/uL — ABNORMAL LOW (ref 4.22–5.81)

## 2011-10-20 LAB — COMPREHENSIVE METABOLIC PANEL
Albumin: 3.4 g/dL — ABNORMAL LOW (ref 3.5–5.2)
Alkaline Phosphatase: 84 U/L (ref 39–117)
BUN: 10 mg/dL (ref 6–23)
CO2: 24 mEq/L (ref 19–32)
Chloride: 106 mEq/L (ref 96–112)
GFR calc non Af Amer: 62 mL/min — ABNORMAL LOW (ref >90)
Glucose, Bld: 94 mg/dL (ref 70–99)
Potassium: 3.6 mEq/L (ref 3.5–5.1)
Total Bilirubin: 0.2 mg/dL — ABNORMAL LOW (ref 0.3–1.2)

## 2011-10-20 NOTE — ED Notes (Signed)
No  Answer x 3

## 2011-10-20 NOTE — ED Notes (Addendum)
Unable to locate

## 2011-10-20 NOTE — ED Notes (Signed)
Pt here for rectal bleeding increasing over the last several months. Now with clots.

## 2011-10-24 IMAGING — CR DG CHEST 2V
2 series · 2 of 2 positions shown · non-contrast
Comparison: 01/18/2008

CLINICAL DATA: Preadmission for OR.

CHEST - 2 VIEW

[view not recorded (1 of 2)]
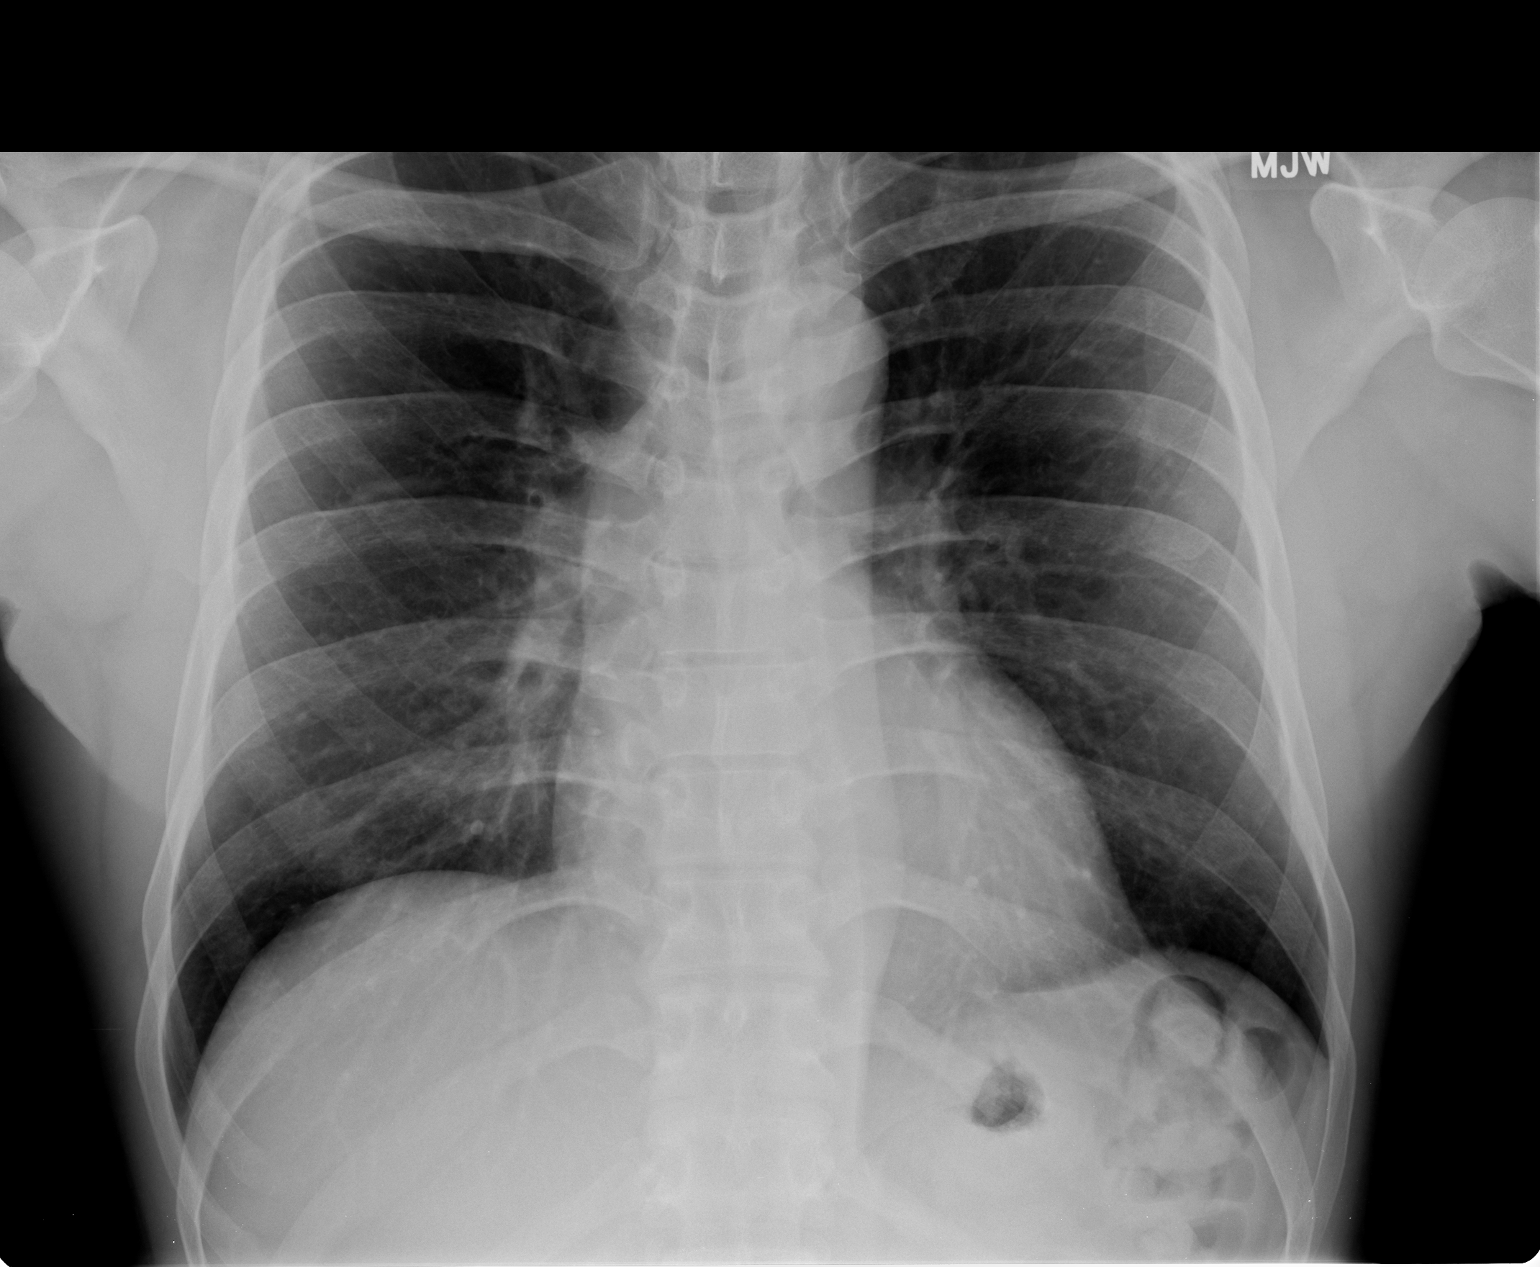

[view not recorded (2 of 2)]
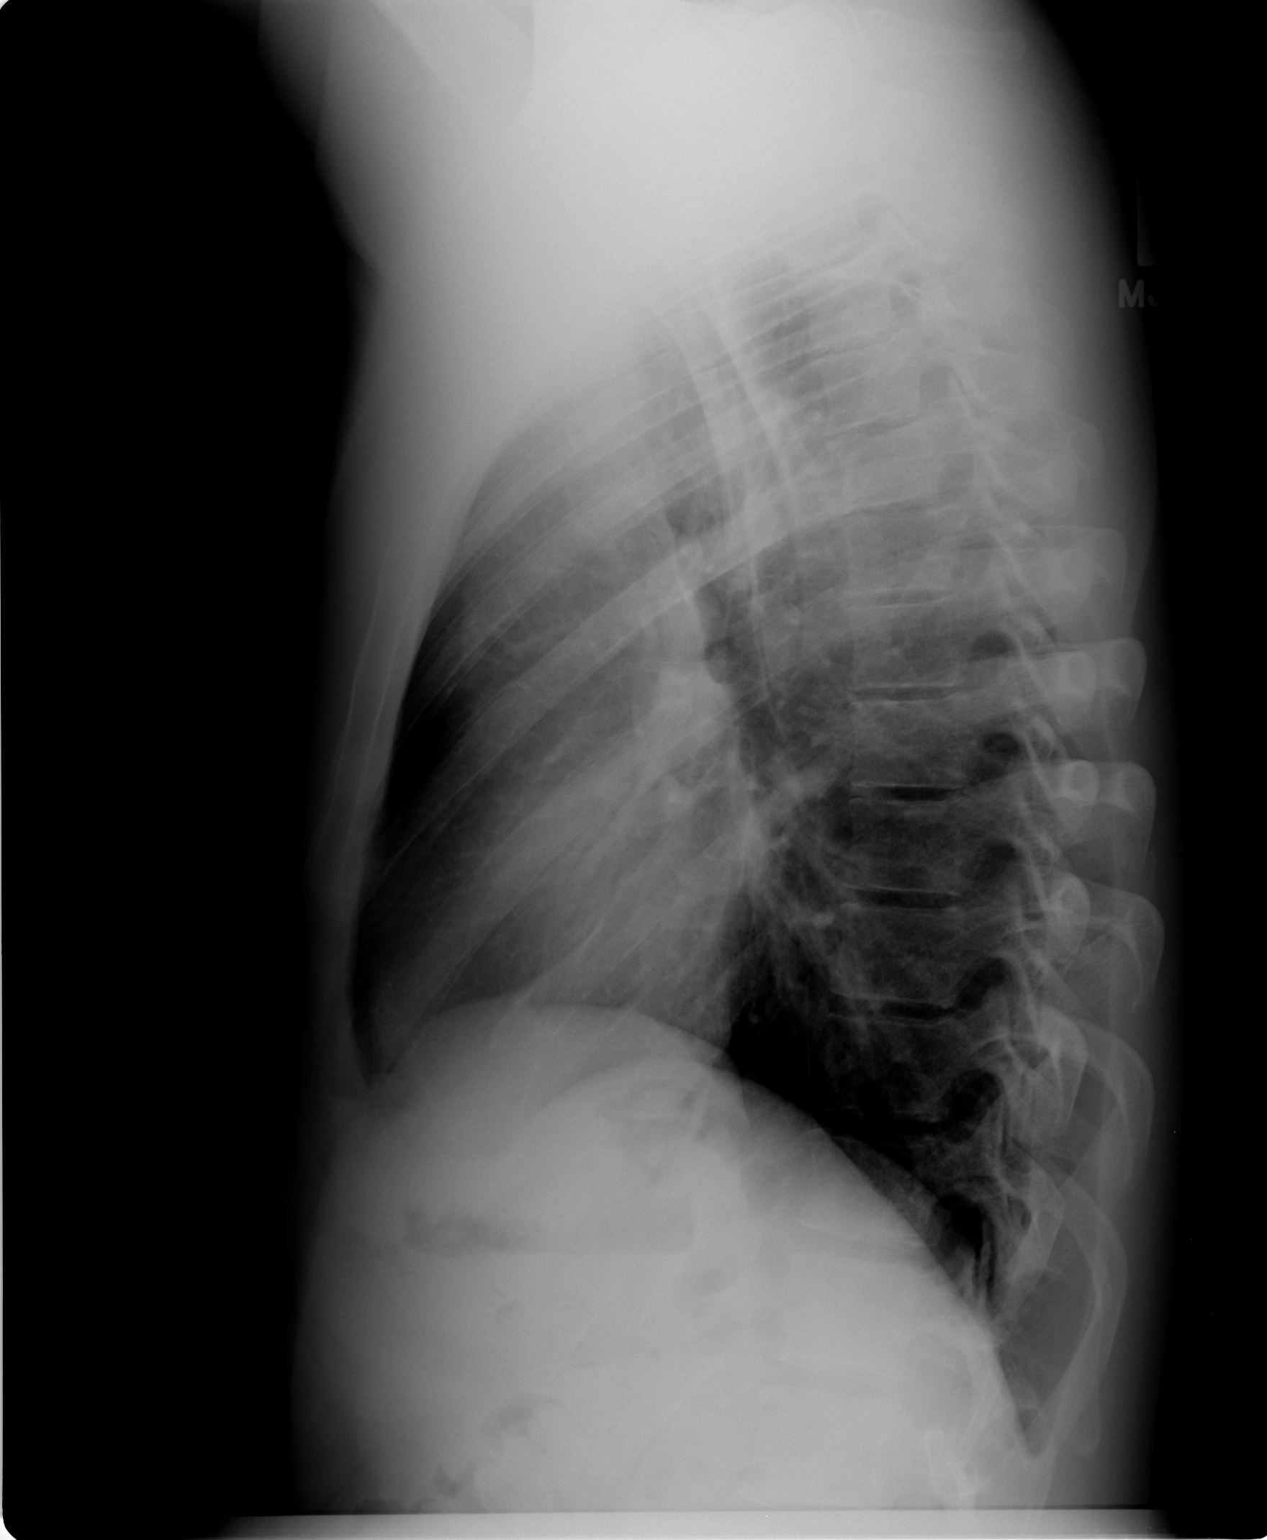

[2 of 2 positions shown; findings below may reference images not displayed]

FINDINGS: Trachea is midline.  Heart size normal.  Lungs are clear.
No pleural fluid.
IMPRESSION: No acute findings.

## 2011-10-25 IMAGING — CR DG FOOT 2V*L*
1 series · 1 of 1 positions shown · non-contrast
Comparison: February 26, 2009 at 7570

CLINICAL DATA: Subtalar fusion, arthritis

LEFT FOOT - 2 VIEW

[foot lat]
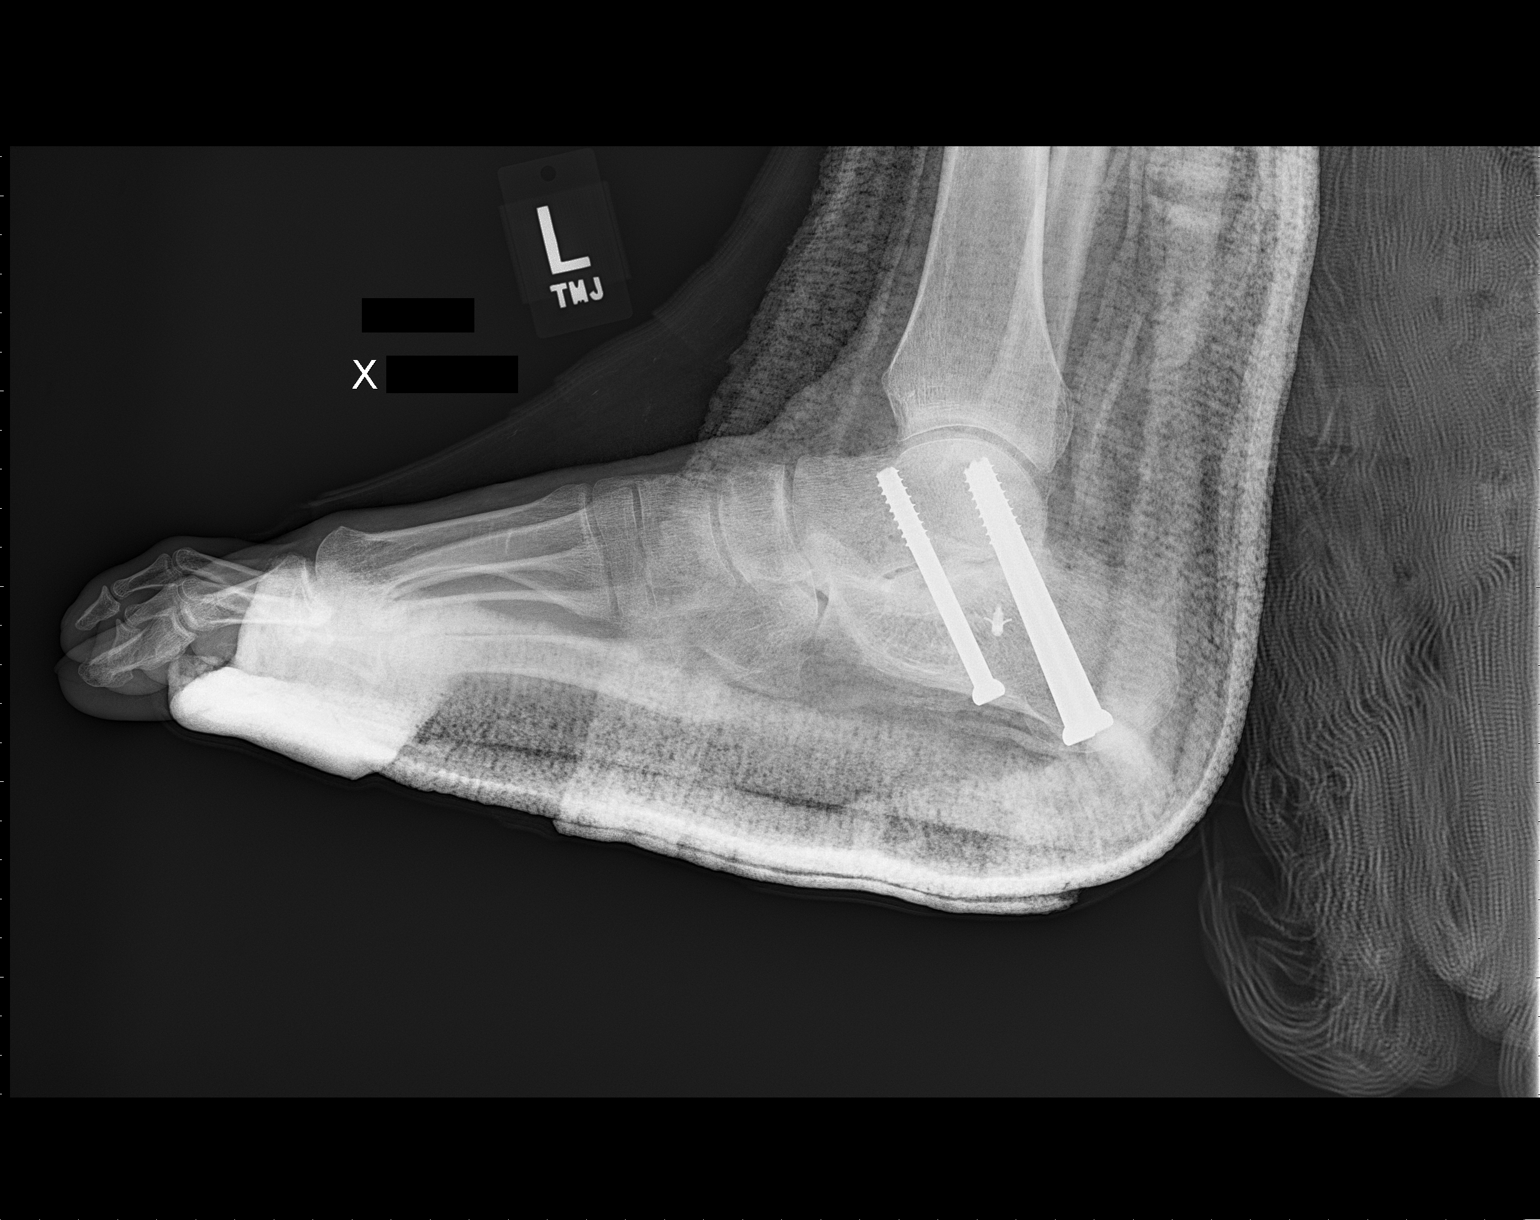

[1 of 1 positions shown; findings below may reference images not displayed]

FINDINGS: A splint is now in place.  There are three lag screws
transfixing the calcaneus to the talus.  The lateral alignment is
near anatomic.  A single orthopedic hook overlies the mid
calcaneus.  There is no evidence of fracture.
IMPRESSION: Subtalar fusion is  anatomic in alignment.

## 2011-10-25 IMAGING — RF DG ANKLE COMPLETE 3+V*L*
1 series · 5 of 5 positions shown · non-contrast
Comparison: MRI left ankle 12/03/2008.

CLINICAL DATA: Subtalar osteoarthritis.

LEFT ANKLE COMPLETE - 3+ VIEW

[Series 1: run · 5 of 5 slices shown]
[im 1/5]
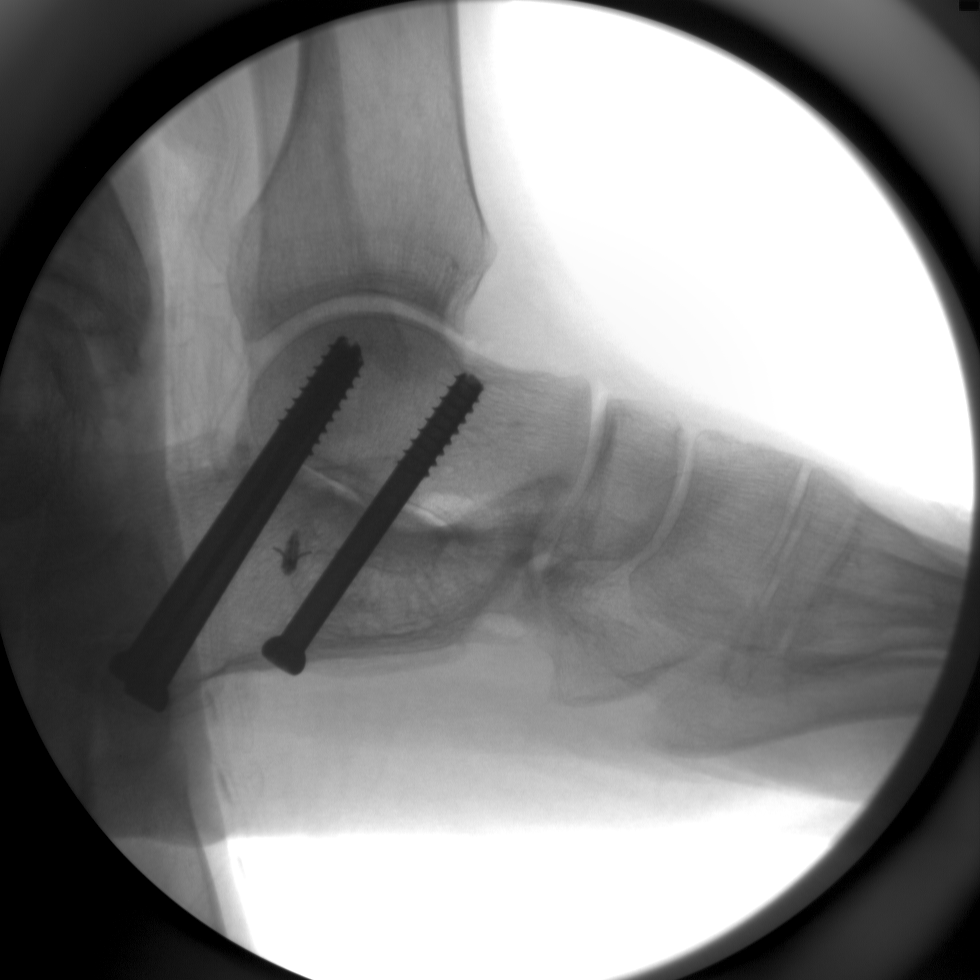
[im 2/5]
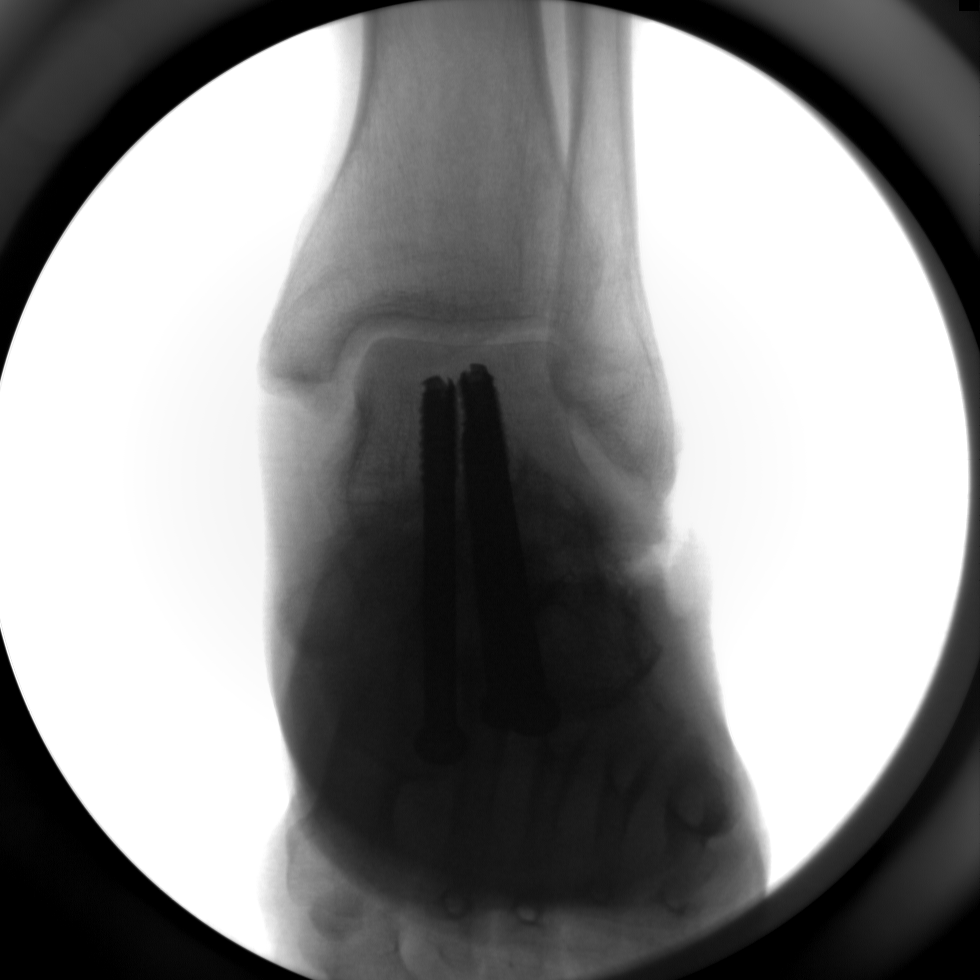
[im 3/5]
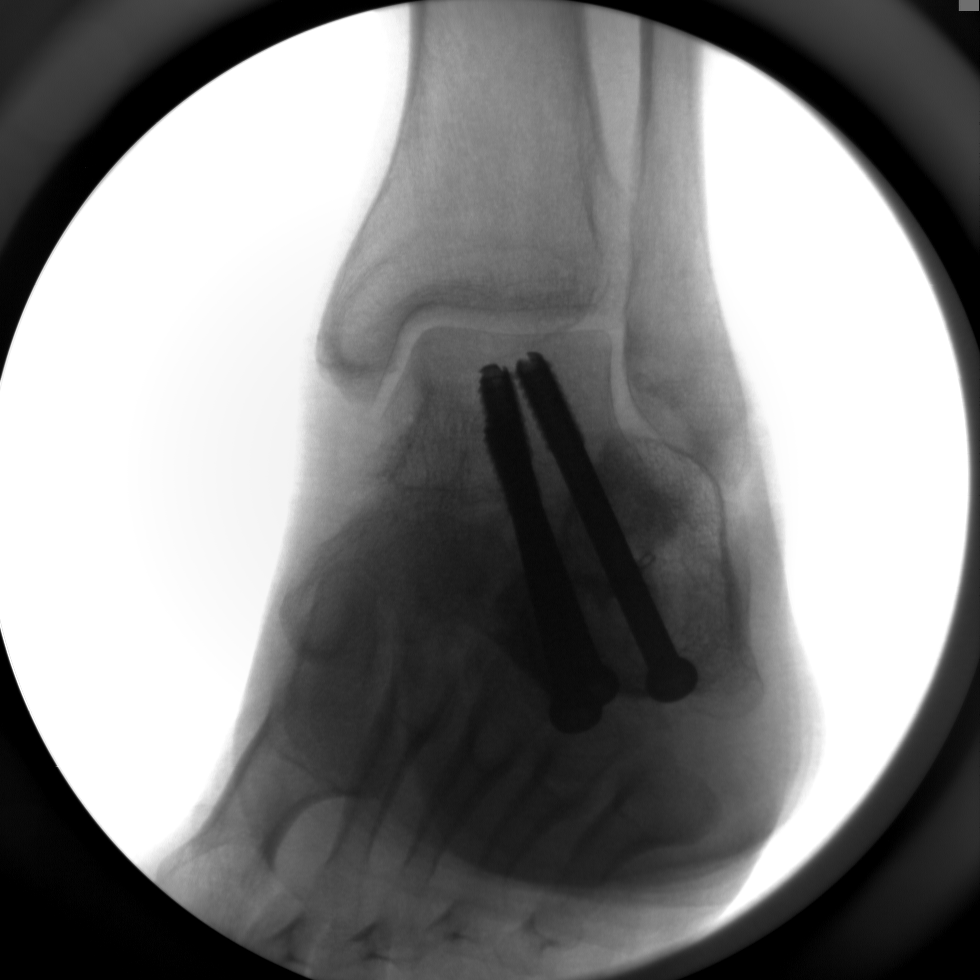
[im 4/5]
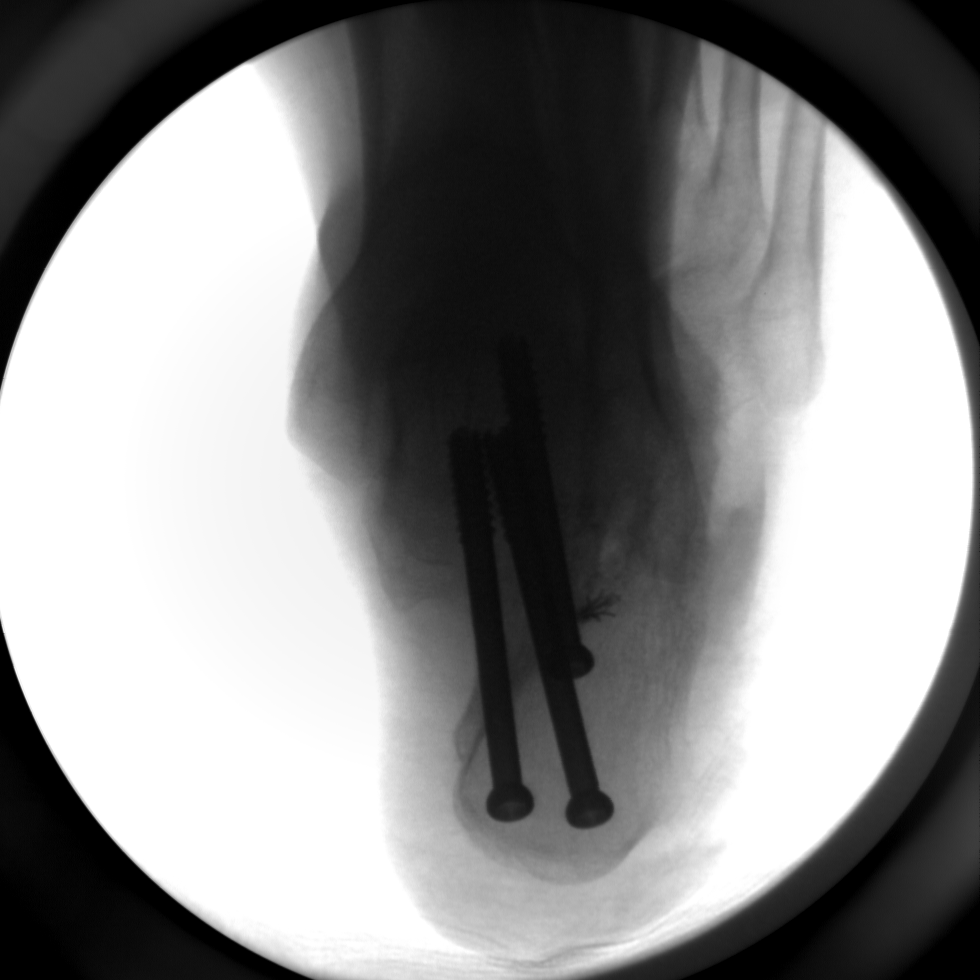
[im 5/5]
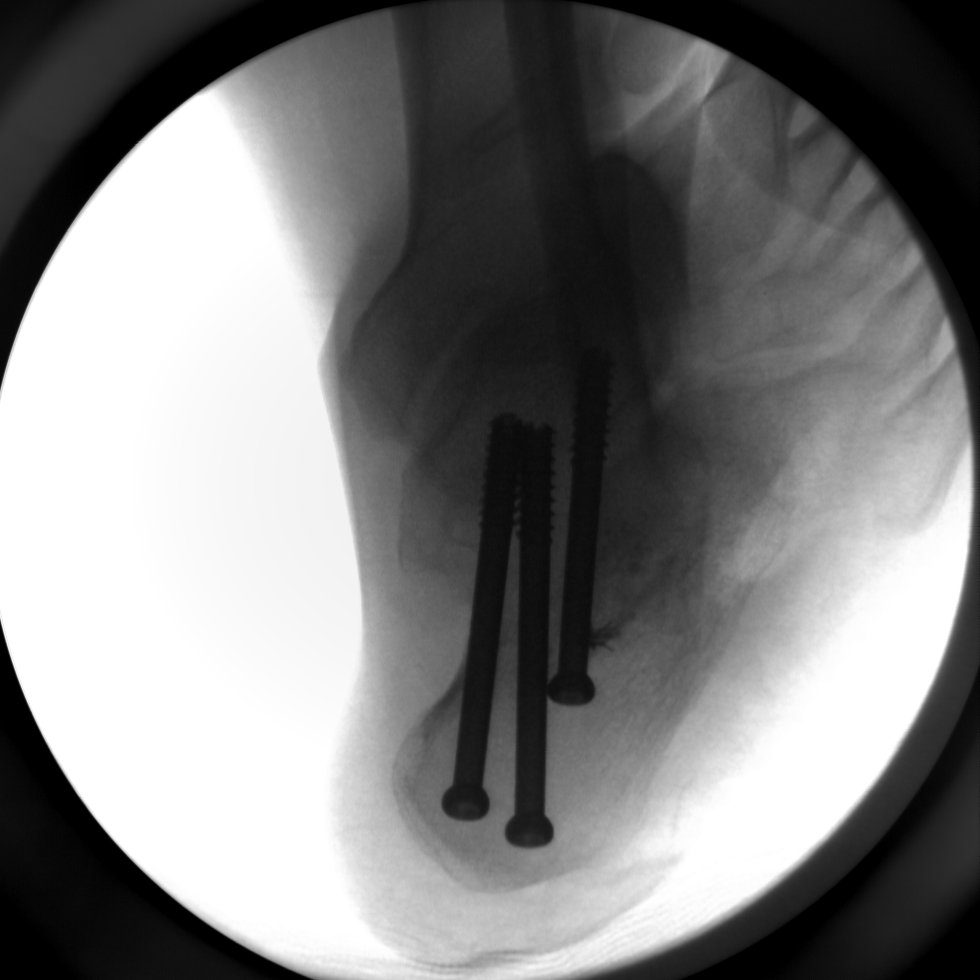

[5 of 5 positions shown; findings below may reference images not displayed]

FINDINGS: We are provided with five fluoroscopic intraoperative
spot views of the left ankle.  Images demonstrate placement of
three lag screws across subtalar joint.  Soft tissue anchor is also
identified in the calcaneus. No evidence of complication.
IMPRESSION: Subtalar fusion.

## 2011-10-25 IMAGING — CR DG OS CALCIS 2+V*L*
1 series · 1 of 1 positions shown · non-contrast
Comparison: Earlier today

CLINICAL DATA: Left subtalar fusion, arthritis

LEFT OS CALCIS - 2+ VIEW

[AP]
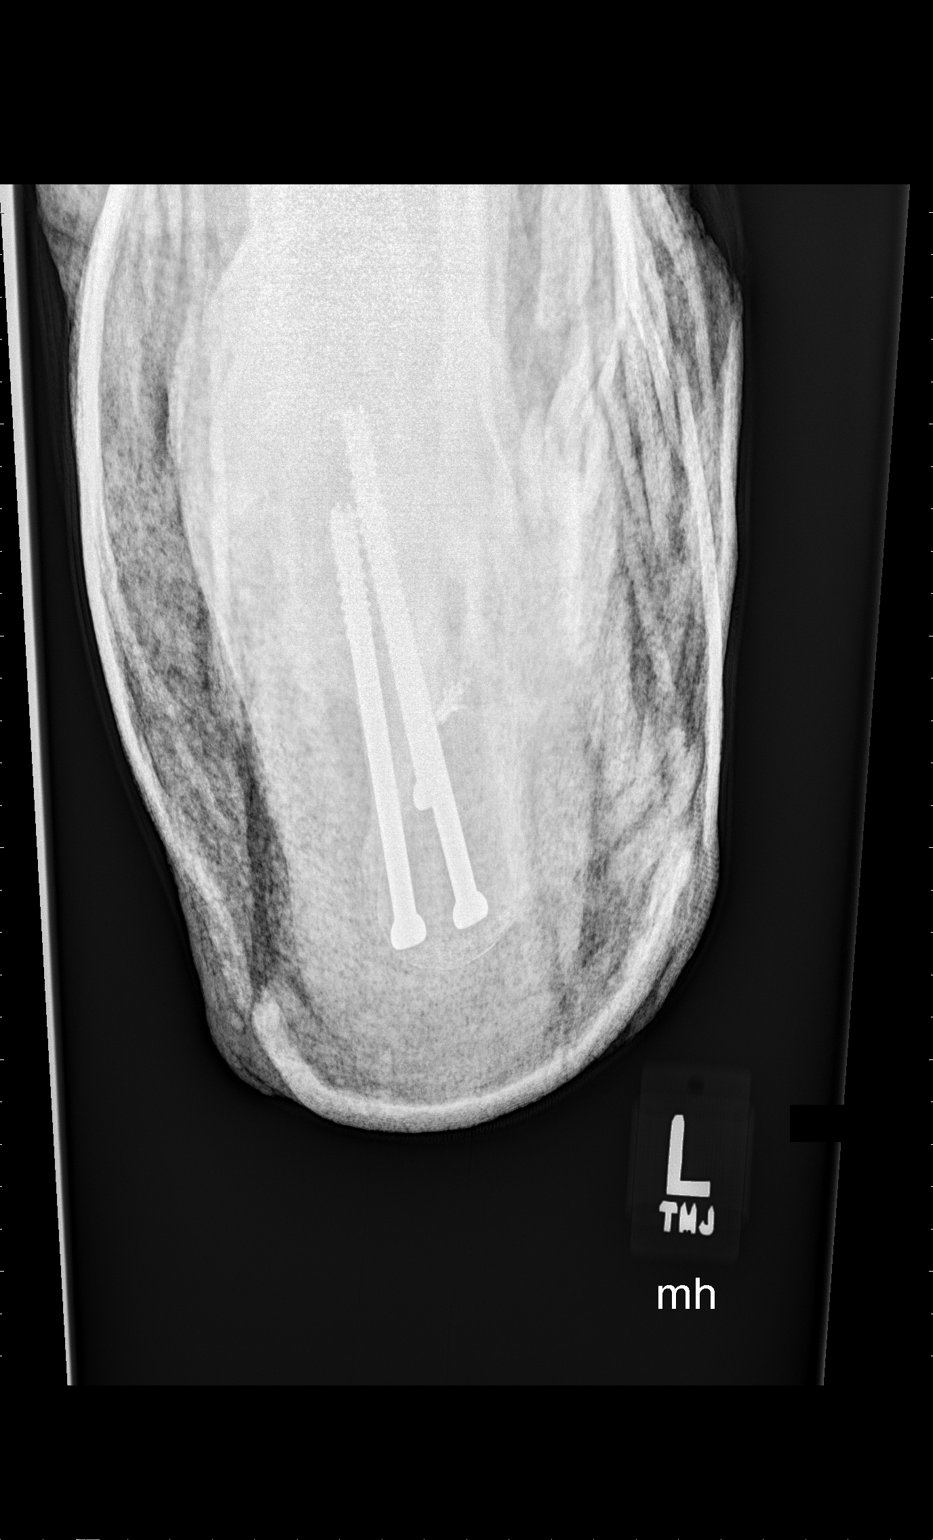

[1 of 1 positions shown; findings below may reference images not displayed]

FINDINGS: The overlying splint obscures detail, so the exact
alignment of the talus and calcaneus are difficult to assess but
are grossly anatomic. Three long lag screws are in place with no
evidence of peri hardware lucency.
IMPRESSION: As above.

## 2011-10-26 IMAGING — CR DG CHEST 2V
1 series · 1 of 1 positions shown · non-contrast
Comparison: 02/25/2009

CLINICAL DATA: Postop from foot surgery.  Cough.

CHEST - 2 VIEW

[w chest lat]
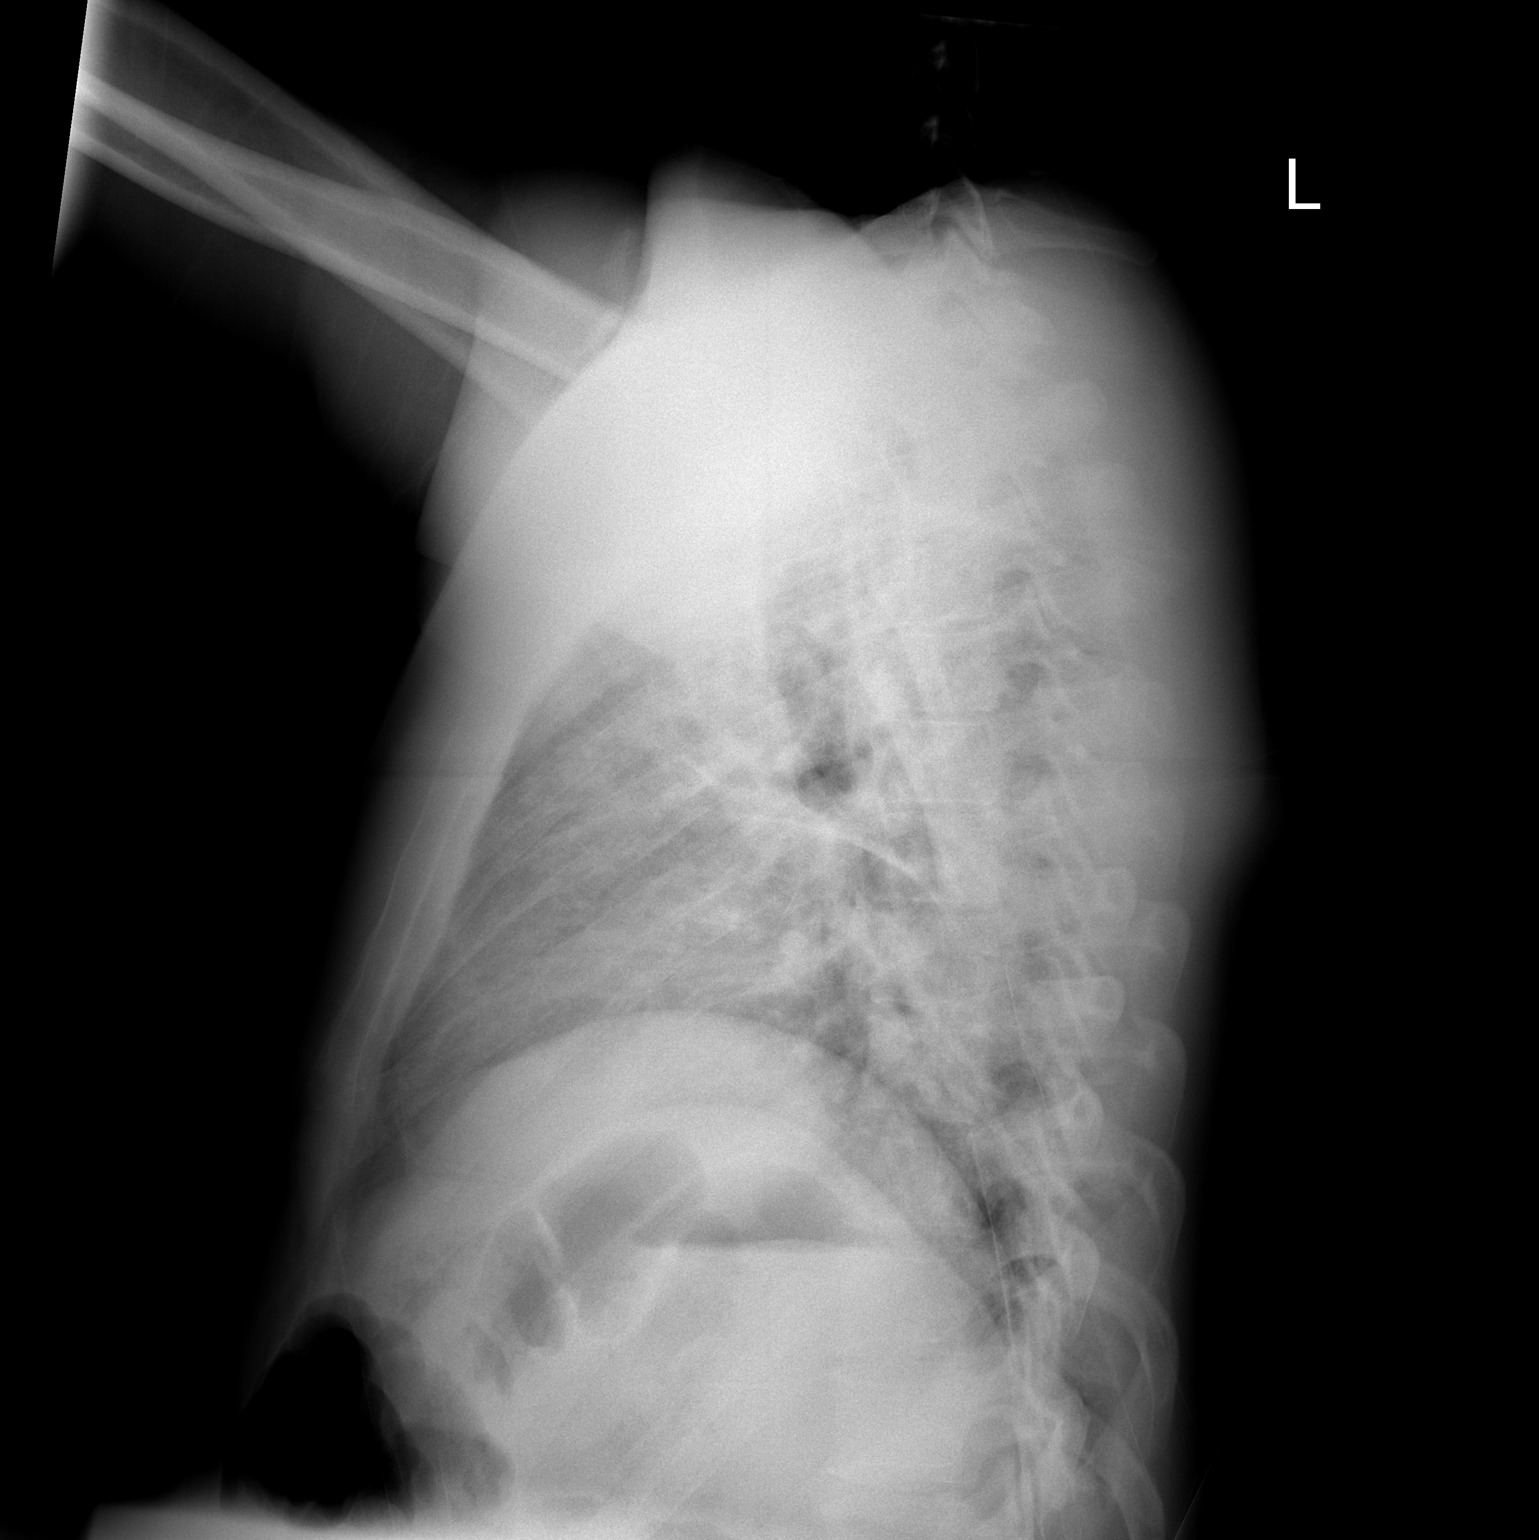

[1 of 1 positions shown; findings below may reference images not displayed]

FINDINGS: Low lung volumes are seen.  Symmetric perihilar air space
disease is seen bilaterally, consistent with pulmonary edema.  Mild
cardiomegaly noted, likely due to decreased lung volumes.  No
evidence of pleural effusion.
IMPRESSION: Low lung volumes and mild to moderate diffuse pulmonary edema.

## 2011-11-12 DIAGNOSIS — I10 Essential (primary) hypertension: Secondary | ICD-10-CM | POA: Diagnosis not present

## 2011-11-12 DIAGNOSIS — N183 Chronic kidney disease, stage 3 unspecified: Secondary | ICD-10-CM | POA: Diagnosis not present

## 2011-11-23 ENCOUNTER — Telehealth: Payer: Self-pay | Admitting: Cardiology

## 2011-11-23 NOTE — Telephone Encounter (Signed)
New Problem:    Called in needing a refill of all of the patient's medications sent in to the pharmacy listed on file.  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

## 2011-11-25 NOTE — Telephone Encounter (Signed)
Pt phones out of service, called mobile number it belonged to pt's cousin, he was able to provide a new # [phone], LVM for pt to return call regarding medications.  Hansel Feinstein. CMA

## 2011-12-02 NOTE — Telephone Encounter (Signed)
Called pt left another voice mail to have pt call regarding the medication he had lost.  [name], [place]

## 2011-12-04 ENCOUNTER — Telehealth: Payer: Self-pay | Admitting: *Deleted

## 2011-12-04 NOTE — Telephone Encounter (Signed)
Have spoke with Webb Silversmith about patient's request. And still not able to reach patient what medications are missing or if any are still missing.  LMTCB   Niurka Benecke, CMA        Lubertha Basque also called pharmacy to let pharmacy know we can not get in touch with patient if patient comes asking if we have refilled rx

## 2011-12-10 ENCOUNTER — Other Ambulatory Visit (HOSPITAL_COMMUNITY): Payer: Medicare Other

## 2012-01-24 ENCOUNTER — Inpatient Hospital Stay (HOSPITAL_COMMUNITY)
Admission: EM | Admit: 2012-01-24 | Discharge: 2012-01-27 | DRG: 291 | Disposition: A | Discharge status: Home / Self Care | Payer: Medicare Other | Attending: Internal Medicine | Admitting: Internal Medicine

## 2012-01-24 ENCOUNTER — Encounter (HOSPITAL_COMMUNITY): Payer: Self-pay | Admitting: Emergency Medicine

## 2012-01-24 ENCOUNTER — Emergency Department (HOSPITAL_COMMUNITY): Payer: Medicare Other

## 2012-01-24 DIAGNOSIS — I351 Nonrheumatic aortic (valve) insufficiency: Secondary | ICD-10-CM | POA: Diagnosis present

## 2012-01-24 DIAGNOSIS — R51 Headache: Secondary | ICD-10-CM | POA: Diagnosis present

## 2012-01-24 DIAGNOSIS — I43 Cardiomyopathy in diseases classified elsewhere: Secondary | ICD-10-CM | POA: Diagnosis present

## 2012-01-24 DIAGNOSIS — F1010 Alcohol abuse, uncomplicated: Secondary | ICD-10-CM | POA: Diagnosis present

## 2012-01-24 DIAGNOSIS — R0989 Other specified symptoms and signs involving the circulatory and respiratory systems: Secondary | ICD-10-CM | POA: Diagnosis not present

## 2012-01-24 DIAGNOSIS — D696 Thrombocytopenia, unspecified: Secondary | ICD-10-CM | POA: Diagnosis present

## 2012-01-24 DIAGNOSIS — F411 Generalized anxiety disorder: Secondary | ICD-10-CM | POA: Diagnosis present

## 2012-01-24 DIAGNOSIS — Z79899 Other long term (current) drug therapy: Secondary | ICD-10-CM

## 2012-01-24 DIAGNOSIS — I080 Rheumatic disorders of both mitral and aortic valves: Secondary | ICD-10-CM | POA: Diagnosis present

## 2012-01-24 DIAGNOSIS — R1013 Epigastric pain: Secondary | ICD-10-CM | POA: Diagnosis present

## 2012-01-24 DIAGNOSIS — I160 Hypertensive urgency: Secondary | ICD-10-CM | POA: Diagnosis present

## 2012-01-24 DIAGNOSIS — I5043 Acute on chronic combined systolic (congestive) and diastolic (congestive) heart failure: Secondary | ICD-10-CM | POA: Diagnosis present

## 2012-01-24 DIAGNOSIS — N289 Disorder of kidney and ureter, unspecified: Secondary | ICD-10-CM

## 2012-01-24 DIAGNOSIS — I130 Hypertensive heart and chronic kidney disease with heart failure and stage 1 through stage 4 chronic kidney disease, or unspecified chronic kidney disease: Principal | ICD-10-CM | POA: Diagnosis present

## 2012-01-24 DIAGNOSIS — K219 Gastro-esophageal reflux disease without esophagitis: Secondary | ICD-10-CM | POA: Diagnosis present

## 2012-01-24 DIAGNOSIS — Z9119 Patient's noncompliance with other medical treatment and regimen: Secondary | ICD-10-CM

## 2012-01-24 DIAGNOSIS — I10 Essential (primary) hypertension: Secondary | ICD-10-CM

## 2012-01-24 DIAGNOSIS — F17200 Nicotine dependence, unspecified, uncomplicated: Secondary | ICD-10-CM | POA: Diagnosis present

## 2012-01-24 DIAGNOSIS — N189 Chronic kidney disease, unspecified: Principal | ICD-10-CM | POA: Diagnosis present

## 2012-01-24 DIAGNOSIS — N182 Chronic kidney disease, stage 2 (mild): Secondary | ICD-10-CM | POA: Diagnosis present

## 2012-01-24 DIAGNOSIS — I509 Heart failure, unspecified: Secondary | ICD-10-CM | POA: Diagnosis not present

## 2012-01-24 DIAGNOSIS — I119 Hypertensive heart disease without heart failure: Secondary | ICD-10-CM | POA: Diagnosis present

## 2012-01-24 DIAGNOSIS — E785 Hyperlipidemia, unspecified: Secondary | ICD-10-CM | POA: Diagnosis present

## 2012-01-24 DIAGNOSIS — Z91199 Patient's noncompliance with other medical treatment and regimen due to unspecified reason: Secondary | ICD-10-CM

## 2012-01-24 DIAGNOSIS — I340 Nonrheumatic mitral (valve) insufficiency: Secondary | ICD-10-CM | POA: Diagnosis present

## 2012-01-24 DIAGNOSIS — R1084 Generalized abdominal pain: Secondary | ICD-10-CM | POA: Diagnosis not present

## 2012-01-24 DIAGNOSIS — N179 Acute kidney failure, unspecified: Secondary | ICD-10-CM | POA: Diagnosis present

## 2012-01-24 LAB — CBC
HCT: 33.4 % — ABNORMAL LOW (ref 39.0–52.0)
MCHC: 33.8 g/dL (ref 30.0–36.0)
RDW: 15.8 % — ABNORMAL HIGH (ref 11.5–15.5)

## 2012-01-24 LAB — URINALYSIS, ROUTINE W REFLEX MICROSCOPIC
Glucose, UA: NEGATIVE mg/dL
Hgb urine dipstick: NEGATIVE
Specific Gravity, Urine: 1.01 (ref 1.005–1.030)
pH: 6.5 (ref 5.0–8.0)

## 2012-01-24 LAB — MRSA PCR SCREENING: MRSA by PCR: NEGATIVE

## 2012-01-24 LAB — COMPREHENSIVE METABOLIC PANEL
ALT: 10 U/L (ref 0–53)
Calcium: 9.4 mg/dL (ref 8.4–10.5)
GFR calc Af Amer: 68 mL/min — ABNORMAL LOW (ref >90)
Glucose, Bld: 94 mg/dL (ref 70–99)
Sodium: 139 mEq/L (ref 135–145)
Total Protein: 7.1 g/dL (ref 6.0–8.3)

## 2012-01-24 LAB — CBC WITH DIFFERENTIAL/PLATELET
HCT: 38.5 % — ABNORMAL LOW (ref 39.0–52.0)
Hemoglobin: 12.9 g/dL — ABNORMAL LOW (ref 13.0–17.0)
Lymphocytes Relative: 33 % (ref 12–46)
MCHC: 33.5 g/dL (ref 30.0–36.0)
Monocytes Absolute: 0.8 10*3/uL (ref 0.1–1.0)
Monocytes Relative: 10 % (ref 3–12)
Neutro Abs: 4 10*3/uL (ref 1.7–7.7)
WBC: 7.5 10*3/uL (ref 4.0–10.5)

## 2012-01-24 LAB — URINE MICROSCOPIC-ADD ON

## 2012-01-24 LAB — TROPONIN I
Troponin I: <0.3 ng/mL (ref <0.30)
Troponin I: <0.3 ng/mL (ref <0.30)

## 2012-01-24 LAB — CREATININE, SERUM: GFR calc non Af Amer: 59 mL/min — ABNORMAL LOW (ref >90)

## 2012-01-24 MED ORDER — LISINOPRIL 20 MG PO TABS
20.0000 mg | ORAL_TABLET | Freq: Every day | ORAL | Status: DC
  Administered 2012-01-24 – 2012-01-27 (×4): 20 mg via ORAL
  Filled 2012-01-24 (×4): qty 1

## 2012-01-24 MED ORDER — IOHEXOL 300 MG/ML  SOLN: 20.0000 mL | INTRAMUSCULAR | Status: AC

## 2012-01-24 MED ORDER — ONDANSETRON HCL 4 MG/2ML IJ SOLN
4.0000 mg | Freq: Once | INTRAMUSCULAR | Status: AC
  Administered 2012-01-24: 4 mg via INTRAVENOUS
  Filled 2012-01-24: qty 2

## 2012-01-24 MED ORDER — PANTOPRAZOLE SODIUM 40 MG IV SOLR
40.0000 mg | Freq: Once | INTRAVENOUS | Status: AC
  Administered 2012-01-24: 40 mg via INTRAVENOUS
  Filled 2012-01-24: qty 40

## 2012-01-24 MED ORDER — SPIRONOLACTONE 25 MG PO TABS
25.0000 mg | ORAL_TABLET | Freq: Every day | ORAL | Status: DC
  Administered 2012-01-24 – 2012-01-27 (×4): 25 mg via ORAL
  Filled 2012-01-24 (×4): qty 1

## 2012-01-24 MED ORDER — ACETAMINOPHEN 325 MG PO TABS
650.0000 mg | ORAL_TABLET | ORAL | Status: DC | PRN
  Administered 2012-01-24 – 2012-01-26 (×6): 650 mg via ORAL
  Filled 2012-01-24 (×6): qty 2

## 2012-01-24 MED ORDER — ONDANSETRON HCL 4 MG/2ML IJ SOLN
4.0000 mg | Freq: Four times a day (QID) | INTRAMUSCULAR | Status: DC | PRN
  Administered 2012-01-25: 4 mg via INTRAVENOUS
  Filled 2012-01-24: qty 2

## 2012-01-24 MED ORDER — NITROGLYCERIN 0.4 MG SL SUBL: 0.4000 mg | SUBLINGUAL_TABLET | SUBLINGUAL | Status: DC | PRN

## 2012-01-24 MED ORDER — ASPIRIN EC 81 MG PO TBEC
81.0000 mg | DELAYED_RELEASE_TABLET | Freq: Every day | ORAL | Status: DC
  Administered 2012-01-25 – 2012-01-27 (×3): 81 mg via ORAL
  Filled 2012-01-24 (×3): qty 1

## 2012-01-24 MED ORDER — ALPRAZOLAM 0.25 MG PO TABS
1.0000 mg | ORAL_TABLET | Freq: Every evening | ORAL | Status: DC | PRN
  Administered 2012-01-25: 1 mg via ORAL
  Filled 2012-01-24: qty 1
  Filled 2012-01-24: qty 3

## 2012-01-24 MED ORDER — HYDRALAZINE HCL 50 MG PO TABS
100.0000 mg | ORAL_TABLET | Freq: Three times a day (TID) | ORAL | Status: DC
  Administered 2012-01-24 – 2012-01-27 (×8): 100 mg via ORAL
  Filled 2012-01-24 (×10): qty 2

## 2012-01-24 MED ORDER — SERTRALINE HCL 50 MG PO TABS
50.0000 mg | ORAL_TABLET | Freq: Every day | ORAL | Status: DC
  Administered 2012-01-24 – 2012-01-27 (×4): 50 mg via ORAL
  Filled 2012-01-24 (×4): qty 1

## 2012-01-24 MED ORDER — FUROSEMIDE 10 MG/ML IJ SOLN
40.0000 mg | Freq: Once | INTRAMUSCULAR | Status: AC
  Administered 2012-01-24: 40 mg via INTRAVENOUS
  Filled 2012-01-24: qty 4

## 2012-01-24 MED ORDER — HYDRALAZINE HCL 100 MG PO TABS: 100.0000 mg | ORAL_TABLET | Freq: Three times a day (TID) | ORAL | Status: DC

## 2012-01-24 MED ORDER — AMLODIPINE BESYLATE 10 MG PO TABS
10.0000 mg | ORAL_TABLET | Freq: Every day | ORAL | Status: DC
  Administered 2012-01-24 – 2012-01-27 (×4): 10 mg via ORAL
  Filled 2012-01-24 (×4): qty 1

## 2012-01-24 MED ORDER — CARVEDILOL 25 MG PO TABS
25.0000 mg | ORAL_TABLET | Freq: Two times a day (BID) | ORAL | Status: DC
  Administered 2012-01-25 – 2012-01-27 (×5): 25 mg via ORAL
  Filled 2012-01-24 (×7): qty 1

## 2012-01-24 MED ORDER — ENOXAPARIN SODIUM 40 MG/0.4ML ~~LOC~~ SOLN
40.0000 mg | Freq: Every day | SUBCUTANEOUS | Status: DC
  Administered 2012-01-24 – 2012-01-26 (×3): 40 mg via SUBCUTANEOUS
  Filled 2012-01-24 (×4): qty 0.4

## 2012-01-24 MED ORDER — LABETALOL HCL 5 MG/ML IV SOLN
20.0000 mg | INTRAVENOUS | Status: DC | PRN
  Administered 2012-01-24 (×6): 20 mg via INTRAVENOUS
  Filled 2012-01-24 (×6): qty 4

## 2012-01-24 MED ORDER — MORPHINE SULFATE 4 MG/ML IJ SOLN
4.0000 mg | Freq: Once | INTRAMUSCULAR | Status: AC
  Administered 2012-01-24: 4 mg via INTRAVENOUS
  Filled 2012-01-24: qty 1

## 2012-01-24 NOTE — ED Notes (Signed)
Pt brought to room form triage; pt placed on monitor, continuous pulse oximetry and blood pressure cuff; family at bedside
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
## 2012-01-24 NOTE — ED Notes (Signed)
Pt made aware of need of urine specimen; does not have to go at this time

## 2012-01-24 NOTE — Progress Notes (Signed)
Pt transferred from ED to 3312 via bed, on monitor with RN. Ax4 and pt able to walk to bed in 3312, BP still elevated MD aware and awaiting PO meds to administer. Wife at bedside and updated. Will continue to monitor.

## 2012-01-25 DIAGNOSIS — I10 Essential (primary) hypertension: Secondary | ICD-10-CM | POA: Diagnosis not present

## 2012-01-25 LAB — LIPID PANEL
HDL: 58 mg/dL (ref >39)
LDL Cholesterol: 112 mg/dL — ABNORMAL HIGH (ref 0–99)
Total CHOL/HDL Ratio: 3.2 RATIO
Triglycerides: 78 mg/dL (ref <150)
VLDL: 16 mg/dL (ref 0–40)

## 2012-01-25 LAB — HEMOGLOBIN A1C: Hgb A1c MFr Bld: 5.2 % (ref <5.7)

## 2012-01-25 LAB — TROPONIN I: Troponin I: <0.3 ng/mL (ref <0.30)

## 2012-01-25 MED ORDER — IBUPROFEN 800 MG PO TABS
800.0000 mg | ORAL_TABLET | Freq: Once | ORAL | Status: AC
  Administered 2012-01-25: 800 mg via ORAL
  Filled 2012-01-25: qty 1

## 2012-01-25 MED ORDER — MORPHINE SULFATE 2 MG/ML IJ SOLN
2.0000 mg | INTRAMUSCULAR | Status: DC | PRN
  Administered 2012-01-25 – 2012-01-26 (×5): 2 mg via INTRAVENOUS
  Filled 2012-01-25 (×6): qty 1

## 2012-01-25 NOTE — Progress Notes (Signed)
Utilization review completed.  

## 2012-01-25 NOTE — Progress Notes (Signed)
Patient ID: [name], male   DOB: Nov 30, 1970, 41 y.o.   MRN: [id number]    SUBJECTIVE: Patient complains of headache this morning.  His home medications have been restarted and BP is down to 146/97.  No complaints of abdominal pain this morning.      [name] amLODipine  10 mg Oral Daily  . aspirin EC  81 mg Oral Daily  . carvedilol  25 mg Oral BID WC  . enoxaparin (LOVENOX) injection  40 mg Subcutaneous QHS  . [COMPLETED] furosemide  40 mg Intravenous Once  . hydrALAZINE  100 mg Oral TID  . [COMPLETED] ibuprofen  800 mg Oral Once  . [EXPIRED] iohexol  20 mL Oral Q1 Hr x 2  . lisinopril  20 mg Oral Daily  . [COMPLETED]  morphine injection  4 mg Intravenous Once  . [COMPLETED] ondansetron (ZOFRAN) IV  4 mg Intravenous Once  . [COMPLETED] pantoprazole (PROTONIX) IV  40 mg Intravenous Once  . sertraline  50 mg Oral Daily  . spironolactone  25 mg Oral Daily  . [DISCONTINUED] hydrALAZINE  100 mg Oral TID      Filed Vitals:   01/25/12 0453 01/25/12 0500 01/25/12 0600 01/25/12 0700  BP:  152/101 150/100 146/97  Pulse: 90 83 78 74  Temp:    98 F (36.7 C)  TempSrc:    Oral  Resp: 22 19 17 19   Height:      Weight:  160 lb 11.5 oz (72.9 kg)    SpO2: 97% 94% 97% 96%    Intake/Output Summary (Last 24 hours) at 01/25/12 0933 Last data filed at 01/25/12 0700  Gross per 24 hour  Intake   1120 ml  Output   2875 ml  Net  -1755 ml    LABS: Basic Metabolic Panel:  [facility] 01/24/12 2039 01/24/12 1516  NA -- 139  K -- 3.5  CL -- 104  CO2 -- 24  GLUCOSE -- 94  BUN -- 10  CREATININE 1.46* 1.47*  CALCIUM -- 9.4  MG 1.6 --  PHOS -- --   Liver Function Tests:  [facility] 01/24/12 1516  AST 15  ALT 10  ALKPHOS 89  BILITOT 0.5  PROT 7.1  ALBUMIN 3.5    Basename 01/24/12 2039  LIPASE 20  AMYLASE 79   CBC:  Basename 01/24/12 2039 01/24/12 1516  WBC 6.8 7.5  NEUTROABS -- 4.0  HGB 11.3* 12.9*  HCT 33.4* 38.5*  MCV 86.3 88.7  PLT 122* 138*   Cardiac  Enzymes:  Basename 01/25/12 0149 01/24/12 2039 01/24/12 1700  CKTOTAL -- -- --  CKMB -- -- --  CKMBINDEX -- -- --  TROPONINI <0.30 <0.30 <0.30   BNP: No components found with this basename: POCBNP:3 D-Dimer: No results found for this basename: DDIMER:2 in the last 72 hours Hemoglobin A1C:  Basename 01/24/12 2039  HGBA1C 5.2   Fasting Lipid Panel:  Basename 01/25/12 0150  CHOL 186  HDL 58  LDLCALC 112*  TRIG 78  CHOLHDL 3.2  LDLDIRECT --   Thyroid Function Tests:  Basename 01/24/12 2039  TSH 2.782  T4TOTAL --  T3FREE --  THYROIDAB --   Anemia Panel: No results found for this basename: VITAMINB12,FOLATE,FERRITIN,TIBC,IRON,RETICCTPCT in the last 72 hours  RADIOLOGY: Ct Abdomen Pelvis Wo Contrast  01/24/2012  *RADIOLOGY REPORT*  Clinical Data: Generalized abd pain and chest pain for 7 years. Shortness of breath.  CT ABDOMEN AND PELVIS WITHOUT CONTRAST  Technique:  Multidetector CT imaging of the abdomen  and pelvis was performed following the standard protocol without intravenous contrast.  Comparison: 05/22/2011; 03/23/2010.  Findings: Airway thickening noted with interstitial accentuation in the lung bases favoring interstitial pulmonary edema.  Cardiomegaly is present along with trace bilateral pleural effusions. The visualized portion of the liver, spleen, pancreas, and adrenal glands appear unremarkable in noncontrast CT appearance.  The gallbladder and biliary system appear unremarkable.  The kidneys appear unremarkable, as do the proximal ureters.  No distal ureteral calculus observed.  Urinary bladder unremarkable.  Orally administered contrast is present in the proximal two-thirds of the small bowel.  The appendix is well identified on coronal images and appears normal.  No dilated bowel observed.  Diffuse disc bulge noted at L4-5.  IMPRESSION:  1.  Cardiomegaly and interstitial pulmonary edema. 2.  Disc bulge at L4-5. 3.   Otherwise, no significant abnormality  identified.   Original Report Authenticated By: Van Clines, M.D.    Dg Chest 2 View  01/24/2012  *RADIOLOGY REPORT*  Clinical Data: Shortness of breath.  Abdominal pain.  Cough, congestion.  History of hypertension history of smoking.  CHEST - 2 VIEW  Comparison: 05/23/2011  Findings: Heart is enlarged.  There is mild pulmonary vascular congestion.  There are no focal consolidations or pleural effusions. Visualized osseous structures have a normal appearance.  IMPRESSION: Cardiomegaly and vascular congestion.   Original Report Authenticated By: Nolon Nations, M.D.     PHYSICAL EXAM General: NAD Neck: No JVD, no thyromegaly or thyroid nodule.  Lungs: Clear to auscultation bilaterally with normal respiratory effort. CV: Nondisplaced PMI.  Heart regular S1/S2, no [id number], 2/6 diastolic murmur upper sternal border, 2/6 systolic murmur.  No peripheral edema.  No carotid bruit.  Normal pedal pulses.  Abdomen: Soft, nontender, no hepatosplenomegaly, no distention.  Neurologic: Alert and oriented x 3.  Psych: Normal affect. Extremities: No clubbing or cyanosis.   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  He had been off his BP meds for a month.  1. HTN: Hypertensive urgency.  Home meds have been restarted.  BP is lower today.  Will need to be careful how fast we lower him.  I suspect his headache is due to BP-lowering.  Continue current med regimen (restarted) today to see how he responds to it.  2. Cardiomyopathy: EF 35-40% on last echo.  BNP elevated but he does not appear volume overloaded on exam.  Restart beta blocker, ACEI, spironolactone.  Echo today.  3. 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  Reassess valves with echo on exam.  Need to control BP to see if AI and  MR will improve over time.  4. CKD: around baseline.   [name] 01/25/2012 9:40 AM

## 2012-01-26 DIAGNOSIS — I340 Nonrheumatic mitral (valve) insufficiency: Secondary | ICD-10-CM | POA: Diagnosis present

## 2012-01-26 DIAGNOSIS — N183 Chronic kidney disease, stage 3 unspecified: Secondary | ICD-10-CM

## 2012-01-26 DIAGNOSIS — I351 Nonrheumatic aortic (valve) insufficiency: Secondary | ICD-10-CM | POA: Diagnosis present

## 2012-01-26 DIAGNOSIS — I359 Nonrheumatic aortic valve disorder, unspecified: Secondary | ICD-10-CM

## 2012-01-26 DIAGNOSIS — N179 Acute kidney failure, unspecified: Secondary | ICD-10-CM | POA: Diagnosis present

## 2012-01-26 DIAGNOSIS — I119 Hypertensive heart disease without heart failure: Secondary | ICD-10-CM | POA: Diagnosis present

## 2012-01-26 DIAGNOSIS — I10 Essential (primary) hypertension: Secondary | ICD-10-CM | POA: Diagnosis not present

## 2012-01-26 HISTORY — DX: Chronic kidney disease, stage 3 unspecified: N18.30

## 2012-01-26 HISTORY — DX: Acute kidney failure, unspecified: N17.9

## 2012-01-26 LAB — BASIC METABOLIC PANEL
CO2: 23 mEq/L (ref 19–32)
Chloride: 100 mEq/L (ref 96–112)
Creatinine, Ser: 1.32 mg/dL (ref 0.50–1.35)

## 2012-01-26 NOTE — Progress Notes (Signed)
  Echocardiogram 2D Echocardiogram has been performed.  Eldwin Volkov FRANCES 01/26/2012, 12:58 PM

## 2012-01-26 NOTE — Progress Notes (Signed)
Patient ID: [name], male   DOB: March 16, 1971, 41 y.o.   MRN: [id number]   SUBJECTIVE: The patient is improving but still has a mild headache. He needs to have the lights turned off. He's not having any chest pain. His blood pressure is improving. We are not bringing it down too fast.   Filed Vitals:   01/26/12 0009 01/26/12 0421 01/26/12 0459 01/26/12 0730  BP: 135/90 152/107  156/103  Pulse: 74 82  70  Temp: 98.3 F (36.8 C) 98.6 F (37 C)  98.1 F (36.7 C)  TempSrc: Oral Oral  Oral  Resp: 14 16  17   Height:      Weight:   160 lb 0.9 oz (72.6 kg)   SpO2: 97% 94%      Intake/Output Summary (Last 24 hours) at 01/26/12 1141 Last data filed at 01/26/12 0900  Gross per 24 hour  Intake    240 ml  Output   1325 ml  Net  -1085 ml    LABS: Basic Metabolic Panel:  Basename 01/26/12 0452 01/24/12 2039 01/24/12 1516  NA 133* -- 139  K 4.1 -- 3.5  CL 100 -- 104  CO2 23 -- 24  GLUCOSE 104* -- 94  BUN 9 -- 10  CREATININE 1.32 1.46* --  CALCIUM 9.3 -- 9.4  MG -- 1.6 --  PHOS -- -- --   Liver Function Tests:  [facility] 01/24/12 1516  AST 15  ALT 10  ALKPHOS 89  BILITOT 0.5  PROT 7.1  ALBUMIN 3.5    Basename 01/24/12 2039  LIPASE 20  AMYLASE 79   CBC:  Basename 01/24/12 2039 01/24/12 1516  WBC 6.8 7.5  NEUTROABS -- 4.0  HGB 11.3* 12.9*  HCT 33.4* 38.5*  MCV 86.3 88.7  PLT 122* 138*   Cardiac Enzymes:  Basename 01/25/12 1017 01/25/12 0149 01/24/12 2039  CKTOTAL -- -- --  CKMB -- -- --  CKMBINDEX -- -- --  TROPONINI <0.30 <0.30 <0.30   BNP: No components found with this basename: POCBNP:3 D-Dimer: No results found for this basename: DDIMER:2 in the last 72 hours Hemoglobin A1C:  Basename 01/24/12 2039  HGBA1C 5.2   Fasting Lipid Panel:  Basename 01/25/12 0150  CHOL 186  HDL 58  LDLCALC 112*  TRIG 78  CHOLHDL 3.2  LDLDIRECT --   Thyroid Function Tests:  Basename 01/24/12 2039  TSH 2.782  T4TOTAL --  T3FREE --  THYROIDAB --     RADIOLOGY: Ct Abdomen Pelvis Wo Contrast  01/24/2012  *RADIOLOGY REPORT*  Clinical Data: Generalized abd pain and chest pain for 7 years. Shortness of breath.  CT ABDOMEN AND PELVIS WITHOUT CONTRAST  Technique:  Multidetector CT imaging of the abdomen and pelvis was performed following the standard protocol without intravenous contrast.  Comparison: 05/22/2011; 03/23/2010.  Findings: Airway thickening noted with interstitial accentuation in the lung bases favoring interstitial pulmonary edema.  Cardiomegaly is present along with trace bilateral pleural effusions. The visualized portion of the liver, spleen, pancreas, and adrenal glands appear unremarkable in noncontrast CT appearance.  The gallbladder and biliary system appear unremarkable.  The kidneys appear unremarkable, as do the proximal ureters.  No distal ureteral calculus observed.  Urinary bladder unremarkable.  Orally administered contrast is present in the proximal two-thirds of the small bowel.  The appendix is well identified on coronal images and appears normal.  No dilated bowel observed.  Diffuse disc bulge noted at L4-5.  IMPRESSION:  1.  Cardiomegaly and interstitial pulmonary  edema. 2.  Disc bulge at L4-5. 3.   Otherwise, no significant abnormality identified.   Original Report Authenticated By: Van Clines, M.D.    Dg Chest 2 View  01/24/2012  *RADIOLOGY REPORT*  Clinical Data: Shortness of breath.  Abdominal pain.  Cough, congestion.  History of hypertension history of smoking.  CHEST - 2 VIEW  Comparison: 05/23/2011  Findings: Heart is enlarged.  There is mild pulmonary vascular congestion.  There are no focal consolidations or pleural effusions. Visualized osseous structures have a normal appearance.  IMPRESSION: Cardiomegaly and vascular congestion.   Original Report Authenticated By: Nolon Nations, M.D.     PHYSICAL EXAM  Patient is oriented to person time and place. Affect is normal. He has mild headache. Family  members in the room. There is no jugulovenous distention. Lungs are clear. Respiratory effort is nonlabored. Cardiac exam reveals S1-S2. There no clicks or significant murmurs. The abdomen is soft. There is no peripheral edema.   TELEMETRY: I have reviewed telemetry today January 26, 2012. There is normal sinus rhythm.   ASSESSMENT AND PLAN:   HYPERTENSION     Blood pressure is improving now that he is back on his medications.   Cardiomyopathy, hypertensive  AI (aortic insufficiency)  MR (mitral regurgitation)     2-D echo is being done today to reassess LV function, aortic insufficiency, mitral regurgitation.   CKD (chronic kidney disease)   Creatinine is a little better today. Continue to follow.   Dola Argyle 01/26/2012 11:41 AM

## 2012-01-26 NOTE — Care Management Note (Signed)
    Page 1 of 1   01/26/2012     1:45:54 PM   CARE MANAGEMENT NOTE 01/26/2012  Patient:  [name], [name]   Account Number:  [id number]  Date Initiated:  01/26/2012  Documentation initiated by:  Marvetta Gibbons  Subjective/Objective Assessment:   Pt admitted with HTN emergency     Action/Plan:   PTA pt lived at home with spouse   Anticipated DC Date:  01/28/2012   Anticipated DC Plan:  [place]  CM consult      Choice offered to / List presented to:             Status of service:  In process, will continue to follow Medicare Important Message given?   (If response is "NO", the following Medicare IM given date fields will be blank) Date Medicare IM given:   Date Additional Medicare IM given:    Discharge Disposition:    Per UR Regulation:  Reviewed for med. necessity/level of care/duration of stay  If discussed at [place] of Stay Meetings, dates discussed:    Comments:  01/26/12- 98- Marvetta Gibbons RN, BSN [phone] Spoke with pt and wife at bedside regarding possible medication assistance needs. Per conversation confirmed that pt does have both Medicare and Medicaid that he uses to get his medications- per pt he is 100% covered for most medications. Per wife it has been past the 30 days since pt has had his medications filled - so he will be able to fill them again at discharge using his Medicaid benefits. Per pt and wife they do not need any assistance with medications and as pt has insurance pt is not eligible for any assistance with medications. No further d/c needs identified- plan to return home with wife.

## 2012-01-27 ENCOUNTER — Telehealth: Payer: Self-pay | Admitting: Cardiology

## 2012-01-27 DIAGNOSIS — I10 Essential (primary) hypertension: Secondary | ICD-10-CM | POA: Diagnosis not present

## 2012-01-27 DIAGNOSIS — I160 Hypertensive urgency: Secondary | ICD-10-CM | POA: Diagnosis present

## 2012-01-27 DIAGNOSIS — D696 Thrombocytopenia, unspecified: Secondary | ICD-10-CM | POA: Diagnosis present

## 2012-01-27 LAB — BASIC METABOLIC PANEL
CO2: 26 mEq/L (ref 19–32)
Calcium: 9.3 mg/dL (ref 8.4–10.5)
Glucose, Bld: 110 mg/dL — ABNORMAL HIGH (ref 70–99)
Potassium: 4 mEq/L (ref 3.5–5.1)
Sodium: 137 mEq/L (ref 135–145)

## 2012-01-27 MED ORDER — CARVEDILOL 25 MG PO TABS: 25.0000 mg | ORAL_TABLET | Freq: Two times a day (BID) | ORAL | Status: DC

## 2012-01-27 NOTE — Progress Notes (Signed)
Patient ID: [name], male   DOB: 10/31/1970, 41 y.o.   MRN: [id number]     SUBJECTIVE: Headache resolved, feels much better.  No dyspnea or chest pain.  BP reasonable.      [name] amLODipine  10 mg Oral Daily  . aspirin EC  81 mg Oral Daily  . carvedilol  25 mg Oral BID WC  . enoxaparin (LOVENOX) injection  40 mg Subcutaneous QHS  . hydrALAZINE  100 mg Oral TID  . lisinopril  20 mg Oral Daily  . sertraline  50 mg Oral Daily  . spironolactone  25 mg Oral Daily      Filed Vitals:   01/26/12 2000 01/26/12 2240 01/27/12 0400 01/27/12 0743  BP: 126/85 134/85 141/85   Pulse: 65  64   Temp: 98.2 F (36.8 C)  98.4 F (36.9 C) 97.9 F (36.6 C)  TempSrc: Oral  Oral Oral  Resp: 15 16 18    Height:      Weight:   162 lb 7.7 oz (73.7 kg)   SpO2: 96% 99% 98%     Intake/Output Summary (Last 24 hours) at 01/27/12 0744 Last data filed at 01/27/12 0743  Gross per 24 hour  Intake    480 ml  Output   2100 ml  Net  -1620 ml    LABS: Basic Metabolic Panel:  Basename 01/26/12 0452 01/24/12 2039 01/24/12 1516  NA 133* -- 139  K 4.1 -- 3.5  CL 100 -- 104  CO2 23 -- 24  GLUCOSE 104* -- 94  BUN 9 -- 10  CREATININE 1.32 1.46* --  CALCIUM 9.3 -- 9.4  MG -- 1.6 --  PHOS -- -- --   Liver Function Tests:  [facility] 01/24/12 1516  AST 15  ALT 10  ALKPHOS 89  BILITOT 0.5  PROT 7.1  ALBUMIN 3.5    Basename 01/24/12 2039  LIPASE 20  AMYLASE 79   CBC:  Basename 01/24/12 2039 01/24/12 1516  WBC 6.8 7.5  NEUTROABS -- 4.0  HGB 11.3* 12.9*  HCT 33.4* 38.5*  MCV 86.3 88.7  PLT 122* 138*   Cardiac Enzymes:  Basename 01/25/12 1017 01/25/12 0149 01/24/12 2039  CKTOTAL -- -- --  CKMB -- -- --  CKMBINDEX -- -- --  TROPONINI <0.30 <0.30 <0.30   BNP: No components found with this basename: POCBNP:3 D-Dimer: No results found for this basename: DDIMER:2 in the last 72 hours Hemoglobin A1C:  Basename 01/24/12 2039  HGBA1C 5.2   Fasting Lipid Panel:  Basename 01/25/12  0150  CHOL 186  HDL 58  LDLCALC 112*  TRIG 78  CHOLHDL 3.2  LDLDIRECT --   Thyroid Function Tests:  Basename 01/24/12 2039  TSH 2.782  T4TOTAL --  T3FREE --  THYROIDAB --   Anemia Panel: No results found for this basename: VITAMINB12,FOLATE,FERRITIN,TIBC,IRON,RETICCTPCT in the last 72 hours  RADIOLOGY: Ct Abdomen Pelvis Wo Contrast  01/24/2012  *RADIOLOGY REPORT*  Clinical Data: Generalized abd pain and chest pain for 7 years. Shortness of breath.  CT ABDOMEN AND PELVIS WITHOUT CONTRAST  Technique:  Multidetector CT imaging of the abdomen and pelvis was performed following the standard protocol without intravenous contrast.  Comparison: 05/22/2011; 03/23/2010.  Findings: Airway thickening noted with interstitial accentuation in the lung bases favoring interstitial pulmonary edema.  Cardiomegaly is present along with trace bilateral pleural effusions. The visualized portion of the liver, spleen, pancreas, and adrenal glands appear unremarkable in noncontrast CT appearance.  The gallbladder and biliary system appear  unremarkable.  The kidneys appear unremarkable, as do the proximal ureters.  No distal ureteral calculus observed.  Urinary bladder unremarkable.  Orally administered contrast is present in the proximal two-thirds of the small bowel.  The appendix is well identified on coronal images and appears normal.  No dilated bowel observed.  Diffuse disc bulge noted at L4-5.  IMPRESSION:  1.  Cardiomegaly and interstitial pulmonary edema. 2.  Disc bulge at L4-5. 3.   Otherwise, no significant abnormality identified.   Original Report Authenticated By: Van Clines, M.D.    Dg Chest 2 View  01/24/2012  *RADIOLOGY REPORT*  Clinical Data: Shortness of breath.  Abdominal pain.  Cough, congestion.  History of hypertension history of smoking.  CHEST - 2 VIEW  Comparison: 05/23/2011  Findings: Heart is enlarged.  There is mild pulmonary vascular congestion.  There are no focal consolidations  or pleural effusions. Visualized osseous structures have a normal appearance.  IMPRESSION: Cardiomegaly and vascular congestion.   Original Report Authenticated By: Nolon Nations, M.D.     PHYSICAL EXAM General: NAD Neck: No JVD, no thyromegaly or thyroid nodule.  Lungs: Clear to auscultation bilaterally with normal respiratory effort. CV: Nondisplaced PMI.  Heart regular S1/S2, no [id number], 2/6 diastolic murmur upper sternal border, 2/6 systolic murmur.  No peripheral edema.  No carotid bruit.  Normal pedal pulses.  Abdomen: Soft, nontender, no hepatosplenomegaly, no distention.  Neurologic: Alert and oriented x 3.  Psych: Normal affect. Extremities: No clubbing or cyanosis.   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  He had been off his BP meds for a month.  1. HTN: Hypertensive urgency.  Home meds have been restarted.  BP is improved.  Continue current regimen.  Needs BMET today.  2. Cardiomyopathy: EF improved to 45-50%, probably due to BP control (was on meds up to a month ago).  BNP elevated but he does not appear volume overloaded on exam.  Restarting beta blocker, ACEI, spironolactone.   3. 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  Mild to moderate AI and mild MR on echo (also improved from prior.   4. CKD: Creatinine better this am, repeat BMET. 5. Disposition: Ok for home today on current medical regimen.  Followup with me in 2 wks with BMET prior.   [name] 01/27/2012 7:44 AM

## 2012-01-27 NOTE — Discharge Summary (Signed)
CARDIOLOGY DISCHARGE SUMMARY   Patient ID: [name] MRN: [id number] DOB/AGE: 39-31-1972 39 y.o.  Admit date: 01/24/2012 Discharge date: 01/27/2012  Primary Discharge Diagnosis:   *Hypertensive urgency  Secondary Discharge Diagnosis:   HYPERTENSION  Cardiomyopathy, hypertensive  AI (aortic insufficiency)  MR (mitral regurgitation)  CKD (chronic kidney disease) Thrombocytopenia  Procedures: 2D Echocardiogram  Hospital Course:  [name] is a 41 year old male with a history of nonischemic cardiomyopathy. He came to the emergency room on the day of admission with increasing dyspnea on exertion, epigastric pain and a blood pressure of 215/156. He had been off all of his medications for a month. He was admitted for further evaluation and treatment.  His primary problem was felt to be hypertensive urgency. His blood pressure medications were gradually initiated and his blood pressure control improved. He was started on a beta blocker, ACE inhibitor and spironolactone. Initially, he was given IV labetalol for improved blood pressure control but was transitioned to oral medications. He received IV Lasix which helped with blood pressure control but he was not significantly volume overloaded on exam. He had trouble cytopenia on admission and his platelets were followed during his hospital stay. They drop slightly but he has no bleeding issues at this time. His platelet count has been as low as 98,000 in the past, this can be followed as an outpatient.  He has a history of left ventricular dysfunction and a 2-D echocardiogram was performed, results described below. This will be managed medically. He has Medicare and Medicaid which covers his medications. Case manager reviewed benefits with him and no further discharge needs were identified.  01/27/2012, his blood pressure control had improved, with a systolic blood pressure between 120 and 145. His epigastric pain had resolved and his dyspnea  on exertion had improved. With re-initiation of an ACE inhibitor and spironolactone, his renal function was followed closely. This will also be followed as an outpatient. He was evaluated by Dr. Aundra Ewing and considered stable for discharge, to follow up as an outpatient.  Labs:  Lab Results  Component Value Date   WBC 6.8 01/24/2012   HGB 11.3* 01/24/2012   HCT 33.4* 01/24/2012   MCV 86.3 01/24/2012   PLT 122* 01/24/2012     Lab 01/27/12 0917 01/24/12 1516  NA 137 --  K 4.0 --  CL 102 --  CO2 26 --  BUN 12 --  CREATININE 1.62* --  CALCIUM 9.3 --  PROT -- 7.1  BILITOT -- 0.5  ALKPHOS -- 89  ALT -- 10  AST -- 15  GLUCOSE 110* --    Basename 01/25/12 1017 01/25/12 0149 01/24/12 2039  CKTOTAL -- -- --  CKMB -- -- --  CKMBINDEX -- -- --  TROPONINI <0.30 <0.30 <0.30   Lipid Panel     Component Value Date/Time   CHOL 186 01/25/2012 0150   TRIG 78 01/25/2012 0150   HDL 58 01/25/2012 0150   CHOLHDL 3.2 01/25/2012 0150   VLDL 16 01/25/2012 0150   LDLCALC 112* 01/25/2012 0150    Pro B Natriuretic peptide (BNP)  Date/Time Value Range Status  01/24/2012  3:40 PM 2400.0* 0 - 125 pg/mL Final  07/17/2011  9:57 AM 61.0  0.0 - 100.0 pg/mL Final      Radiology: Ct Abdomen Pelvis Wo Contrast 01/24/2012  *RADIOLOGY REPORT*  Clinical Data: Generalized abd pain and chest pain for 7 years. Shortness of breath.  CT ABDOMEN AND PELVIS WITHOUT CONTRAST  Technique:  Multidetector CT imaging  of the abdomen and pelvis was performed following the standard protocol without intravenous contrast.  Comparison: 05/22/2011; 03/23/2010.  Findings: Airway thickening noted with interstitial accentuation in the lung bases favoring interstitial pulmonary edema.  Cardiomegaly is present along with trace bilateral pleural effusions. The visualized portion of the liver, spleen, pancreas, and adrenal glands appear unremarkable in noncontrast CT appearance.  The gallbladder and biliary system appear unremarkable.   The kidneys appear unremarkable, as do the proximal ureters.  No distal ureteral calculus observed.  Urinary bladder unremarkable.  Orally administered contrast is present in the proximal two-thirds of the small bowel.  The appendix is well identified on coronal images and appears normal.  No dilated bowel observed.  Diffuse disc bulge noted at L4-5.  IMPRESSION:  1.  Cardiomegaly and interstitial pulmonary edema. 2.  Disc bulge at L4-5. 3.   Otherwise, no significant abnormality identified.   Original Report Authenticated By: [name], M.D.    Dg Chest 2 View 01/24/2012  *RADIOLOGY REPORT*  Clinical Data: Shortness of breath.  Abdominal pain.  Cough, congestion.  History of hypertension history of smoking.  CHEST - 2 VIEW  Comparison: 05/23/2011  Findings: Heart is enlarged.  There is mild pulmonary vascular congestion.  There are no focal consolidations or pleural effusions. Visualized osseous structures have a normal appearance.  IMPRESSION: Cardiomegaly and vascular congestion.   Original Report Authenticated By: [name], M.D.    EKG: 26-Jan-2012 07:09:20 [facility] ROUTINE RECORD Normal sinus rhythm Biatrial enlargement Left ventricular hypertrophy with repolarization abnormality When compared with ECG of 01/25/12, QRS axis has shifted rightward; R-wave progression is now normal 89mm/s 30mm/mV 100Hz  8.0.1 12SL 241 HD CID: 1 Referred by: Confirmed By: Andre Ducking MD Vent. rate 70 BPM PR interval 154 ms QRS duration 96 ms QT/QTc 440/475 ms P-R-T axes 43 -21 82  Echo: 01/26/2012 Study Conclusions - Left ventricle: The cavity size was normal. Wall thickness was increased in a pattern of moderate LVH. Systolic function was mildly reduced. The estimated ejection fraction was in the range of 45% to 50%. Diffuse hypokinesis. - Aortic valve: Mild to moderate regurgitation. - Aorta: Mild dilitation of aortic root. - Mitral valve: Mild thickening of the  leaflets. Mild regurgitation. - Right ventricle: The cavity size was mildly dilated. - Right atrium: The atrium was mildly dilated. - Impressions: Since the prior study, LV motion looks a little better and there is decrease in the amount of MR. Impressions: - Since the prior study, LV motion looks a little better and there is decrease in the amount of MR.   FOLLOW UP PLANS AND APPOINTMENTS     Follow-up Information    Follow up with [name], MD. On 02/09/2012. (Blood work at 8:30, see MD at 8:45 am.)    Contact information:   [address] [place] [address] [phone]         No Known Allergies   Medication List     As of 01/27/2012  3:35 PM    STOP taking these medications         GOODYS EXTRA STRENGTH PO      TAKE these medications         albuterol 108 (90 BASE) MCG/ACT inhaler   Commonly known as: PROVENTIL HFA;VENTOLIN HFA   Inhale 2 puffs into the lungs every 6 (six) hours as needed. For shortness of breath      ALPRAZolam 1 MG tablet  Commonly known as: XANAX   Take 1 mg by mouth at bedtime as needed. For anxiety/sleep      amLODipine 10 MG tablet   Commonly known as: NORVASC   Take 10 mg by mouth daily.      carvedilol 25 MG tablet   Commonly known as: COREG   Take 1 tablet (25 mg total) by mouth 2 (two) times daily with a meal.      furosemide 20 MG tablet   Commonly known as: LASIX   Take 20 mg by mouth daily.      hydrALAZINE 100 MG tablet   Commonly known as: APRESOLINE   Take 100 mg by mouth 3 (three) times daily.      ibuprofen 800 MG tablet   Commonly known as: ADVIL,MOTRIN   Take 800 mg by mouth 2 (two) times daily as needed. For pain      lisinopril 20 MG tablet   Commonly known as: PRINIVIL,ZESTRIL   Take 20 mg by mouth daily.      sertraline 50 MG tablet   Commonly known as: ZOLOFT   Take 50 mg by mouth daily.      spironolactone 25 MG tablet   Commonly known as: ALDACTONE    Take 25 mg by mouth daily.        BRING ALL MEDICATIONS WITH YOU TO FOLLOW UP APPOINTMENTS  Time spent with patient to include physician time: 32 min Signed: Rosaria Ferries 01/27/2012, 3:35 PM Co-Sign MD

## 2012-01-27 NOTE — Progress Notes (Signed)
Discharge instructions given to pt.  Pt verbalized understanding with all questions answered.  Pt discharged to home with wife.  Vista Lawman, RN

## 2012-02-08 ENCOUNTER — Other Ambulatory Visit: Payer: Self-pay | Admitting: *Deleted

## 2012-02-08 DIAGNOSIS — N289 Disorder of kidney and ureter, unspecified: Secondary | ICD-10-CM

## 2012-02-09 ENCOUNTER — Encounter: Payer: Medicare Other | Admitting: Cardiology

## 2012-02-09 ENCOUNTER — Other Ambulatory Visit: Payer: Medicare Other

## 2012-02-20 ENCOUNTER — Encounter (HOSPITAL_COMMUNITY): Payer: Self-pay | Admitting: Emergency Medicine

## 2012-02-20 ENCOUNTER — Emergency Department (HOSPITAL_COMMUNITY)
Admission: EM | Admit: 2012-02-20 | Discharge: 2012-02-20 | Disposition: A | Discharge status: Home / Self Care | Payer: Medicare Other | Attending: Emergency Medicine | Admitting: Emergency Medicine

## 2012-02-20 DIAGNOSIS — R011 Cardiac murmur, unspecified: Secondary | ICD-10-CM | POA: Diagnosis not present

## 2012-02-20 DIAGNOSIS — F411 Generalized anxiety disorder: Secondary | ICD-10-CM | POA: Diagnosis not present

## 2012-02-20 DIAGNOSIS — K219 Gastro-esophageal reflux disease without esophagitis: Secondary | ICD-10-CM | POA: Insufficient documentation

## 2012-02-20 DIAGNOSIS — F17200 Nicotine dependence, unspecified, uncomplicated: Secondary | ICD-10-CM | POA: Insufficient documentation

## 2012-02-20 DIAGNOSIS — H9209 Otalgia, unspecified ear: Secondary | ICD-10-CM | POA: Insufficient documentation

## 2012-02-20 DIAGNOSIS — I509 Heart failure, unspecified: Secondary | ICD-10-CM | POA: Diagnosis not present

## 2012-02-20 DIAGNOSIS — E785 Hyperlipidemia, unspecified: Secondary | ICD-10-CM | POA: Diagnosis not present

## 2012-02-20 DIAGNOSIS — N189 Chronic kidney disease, unspecified: Secondary | ICD-10-CM | POA: Diagnosis not present

## 2012-02-20 DIAGNOSIS — I359 Nonrheumatic aortic valve disorder, unspecified: Secondary | ICD-10-CM | POA: Insufficient documentation

## 2012-02-20 DIAGNOSIS — Z79899 Other long term (current) drug therapy: Secondary | ICD-10-CM | POA: Insufficient documentation

## 2012-02-20 DIAGNOSIS — Z8679 Personal history of other diseases of the circulatory system: Secondary | ICD-10-CM | POA: Insufficient documentation

## 2012-02-20 DIAGNOSIS — J029 Acute pharyngitis, unspecified: Secondary | ICD-10-CM | POA: Insufficient documentation

## 2012-02-20 DIAGNOSIS — I129 Hypertensive chronic kidney disease with stage 1 through stage 4 chronic kidney disease, or unspecified chronic kidney disease: Secondary | ICD-10-CM | POA: Diagnosis not present

## 2012-02-20 MED ORDER — PENICILLIN V POTASSIUM 250 MG PO TABS
500.0000 mg | ORAL_TABLET | Freq: Once | ORAL | Status: AC
  Administered 2012-02-20: 500 mg via ORAL
  Filled 2012-02-20: qty 2

## 2012-02-20 MED ORDER — PENICILLIN V POTASSIUM 500 MG PO TABS: 500.0000 mg | ORAL_TABLET | Freq: Four times a day (QID) | ORAL | Status: AC

## 2012-02-20 MED ORDER — HYDROCODONE-ACETAMINOPHEN 5-325 MG PO TABS: 1.0000 | ORAL_TABLET | ORAL | Status: DC | PRN

## 2012-02-20 MED ORDER — HYDROCODONE-ACETAMINOPHEN 5-325 MG PO TABS
1.0000 | ORAL_TABLET | Freq: Once | ORAL | Status: AC
  Administered 2012-02-20: 1 via ORAL
  Filled 2012-02-20: qty 1

## 2012-02-20 NOTE — ED Provider Notes (Signed)
History     CSN: [id number]  Arrival date & time 02/20/12  [id number]   First MD Initiated Contact with Patient 02/20/12 [phone]      Chief Complaint  Patient presents with  . Sore Throat  . Otalgia    (Consider location/radiation/quality/duration/timing/severity/associated sxs/prior treatment) Patient is a 41 y.o. male presenting with pharyngitis and ear pain. The history is provided by the patient and medical records. No language interpreter was used.  Sore Throat This is a new problem. Episode onset: 3 days. The problem occurs constantly. The problem has not changed since onset.Associated symptoms comments: Difficulty swallowing.. Nothing aggravates the symptoms. Nothing relieves the symptoms. He has tried nothing for the symptoms.  Otalgia Associated symptoms include sore throat. Pertinent negatives include no cough.    Past Medical History  Diagnosis Date  . Hypertension     Hx of HTN urgency secondary to noncompliance  . Anxiety   . GERD (gastroesophageal reflux disease)   . Acute renal failure     June 2012 felt secondary to Toradol  . Chronic renal failure   . CHF (congestive heart failure)     EF 35% 05/2011  . Cardiomegaly - hypertensive   . Smoker   . Acute diastolic heart failure [id number]  . AORTIC REGURGITATION, MILD 03/11/2009    Qualifier: Diagnosis of  By: Jorene Minors, Scott    . HYPERLIPIDEMIA 08/26/2009    Qualifier: Diagnosis of  By: Jorene Minors, Scott    . INGUINAL HERNIA 02/18/2009    Qualifier: Diagnosis of  By: Jorene Minors, Scott    . MURMUR 01/16/2009    Qualifier: Diagnosis of  By: Versie Starks      Past Surgical History  Procedure Date  . Hernia repair   . Ankle surgery     Family History  Problem Relation Age of Onset  . Hypertension    . Colon cancer Paternal Uncle     History  Substance Use Topics  . Smoking status: Current Some Day Smoker -- 0.2 packs/day for 20 years    Types: Cigarettes  . Smokeless tobacco: Never Used  .  Alcohol Use: No     Comment: used to drink 1 pint of gin a week      Review of Systems  Constitutional: Negative for fever and chills.  HENT: Positive for ear pain and sore throat.   Eyes: Negative.   Respiratory: Negative.  Negative for cough.   Gastrointestinal: Negative.   Genitourinary: Negative.   Musculoskeletal: Negative.   Skin: Negative.   Neurological: Negative.   Psychiatric/Behavioral: Negative.     Allergies  Review of patient's allergies indicates no known allergies.  Home Medications   Current Outpatient Rx  Name  Route  Sig  Dispense  Refill  . ALBUTEROL SULFATE HFA 108 (90 BASE) MCG/ACT IN AERS   Inhalation   Inhale 2 puffs into the lungs every 6 (six) hours as needed. For shortness of breath         . ALPRAZOLAM 1 MG PO TABS   Oral   Take 1 mg by mouth at bedtime as needed. For anxiety/sleep         . AMLODIPINE BESYLATE 10 MG PO TABS   Oral   Take 10 mg by mouth daily.         [name] CARVEDILOL 25 MG PO TABS   Oral   Take 1 tablet (25 mg total) by mouth 2 (two) times daily with a meal.   60 tablet  11   . FUROSEMIDE 20 MG PO TABS   Oral   Take 20 mg by mouth daily.          [name] HYDRALAZINE HCL 100 MG PO TABS   Oral   Take 100 mg by mouth 3 (three) times daily.         . IBUPROFEN 800 MG PO TABS   Oral   Take 800 mg by mouth 2 (two) times daily as needed. For pain         . LISINOPRIL 20 MG PO TABS   Oral   Take 20 mg by mouth daily.         . SERTRALINE HCL 50 MG PO TABS   Oral   Take 50 mg by mouth daily.         [name] SPIRONOLACTONE 25 MG PO TABS   Oral   Take 25 mg by mouth daily.           BP 161/112  Pulse 78  Temp 98.4 F (36.9 C) (Oral)  Resp 18  SpO2 97%  Physical Exam  Nursing note and vitals reviewed. Constitutional: He is oriented to person, place, and time. He appears well-developed and well-nourished.       In moderate distress, complaining of sore throat.  HENT:  Head: Normocephalic and  atraumatic.  Right Ear: External ear normal.  Left Ear: External ear normal.       Throat red, no exudate.  Eyes: Conjunctivae normal and EOM are normal. Pupils are equal, round, and reactive to light.  Neck:       Tender left cervical adenopathy.  Cardiovascular: Normal rate, regular rhythm and normal heart sounds.   Pulmonary/Chest: Effort normal and breath sounds normal.  Abdominal: Soft. Bowel sounds are normal.  Musculoskeletal: Normal range of motion. He exhibits no edema and no tenderness.  Neurological: He is alert and oriented to person, place, and time.       No sensory or motor deficit.  Skin: Skin is warm and dry.  Psychiatric: He has a normal mood and affect. His behavior is normal.    ED Course  Procedures (including critical care time)  8:50 AM Results for orders placed during the hospital encounter of 02/20/12  RAPID STREP SCREEN      Component Value Range   Streptococcus, Group A Screen (Direct) NEGATIVE  NEGATIVE   8:50 AM Plan to treat as for strep throat due to severity of symptoms.  Rx PenVK 500 mg qid x 10 days, hydrocodone-acetaminophen q4h prn pain.  Pt to check temp ac and hs, and to take Tylenol if temp >100.6 degrees   1. Pharyngitis             Mylinda Latina III, MD 02/20/12 [phone]

## 2012-02-20 NOTE — ED Notes (Signed)
Patient reports that he took his blood pressure medication this morning at 0630.

## 2012-02-20 NOTE — ED Notes (Signed)
Patient complaining of left sided sore throat and left earache for the past three days.  Patient denies cough, runny nose, shortness of breath, difficulty breathing, and nausea/vomiting.  States that he has not received his flu shot this year.

## 2012-03-25 ENCOUNTER — Emergency Department (HOSPITAL_COMMUNITY): Payer: Medicare Other

## 2012-03-25 ENCOUNTER — Emergency Department (HOSPITAL_COMMUNITY)
Admission: EM | Admit: 2012-03-25 | Discharge: 2012-03-25 | Disposition: A | Discharge status: Home / Self Care | Payer: Medicare Other | Attending: Emergency Medicine | Admitting: Emergency Medicine

## 2012-03-25 ENCOUNTER — Encounter (HOSPITAL_COMMUNITY): Payer: Self-pay | Admitting: Emergency Medicine

## 2012-03-25 DIAGNOSIS — F17200 Nicotine dependence, unspecified, uncomplicated: Secondary | ICD-10-CM | POA: Insufficient documentation

## 2012-03-25 DIAGNOSIS — Z8719 Personal history of other diseases of the digestive system: Secondary | ICD-10-CM | POA: Insufficient documentation

## 2012-03-25 DIAGNOSIS — Z79899 Other long term (current) drug therapy: Secondary | ICD-10-CM | POA: Diagnosis not present

## 2012-03-25 DIAGNOSIS — Z8659 Personal history of other mental and behavioral disorders: Secondary | ICD-10-CM | POA: Insufficient documentation

## 2012-03-25 DIAGNOSIS — R300 Dysuria: Secondary | ICD-10-CM | POA: Insufficient documentation

## 2012-03-25 DIAGNOSIS — I509 Heart failure, unspecified: Secondary | ICD-10-CM | POA: Insufficient documentation

## 2012-03-25 DIAGNOSIS — N179 Acute kidney failure, unspecified: Secondary | ICD-10-CM | POA: Diagnosis not present

## 2012-03-25 DIAGNOSIS — R011 Cardiac murmur, unspecified: Secondary | ICD-10-CM | POA: Diagnosis not present

## 2012-03-25 DIAGNOSIS — E785 Hyperlipidemia, unspecified: Secondary | ICD-10-CM | POA: Insufficient documentation

## 2012-03-25 DIAGNOSIS — I5031 Acute diastolic (congestive) heart failure: Secondary | ICD-10-CM | POA: Diagnosis not present

## 2012-03-25 DIAGNOSIS — I359 Nonrheumatic aortic valve disorder, unspecified: Secondary | ICD-10-CM | POA: Insufficient documentation

## 2012-03-25 DIAGNOSIS — N509 Disorder of male genital organs, unspecified: Secondary | ICD-10-CM | POA: Insufficient documentation

## 2012-03-25 DIAGNOSIS — R109 Unspecified abdominal pain: Secondary | ICD-10-CM | POA: Diagnosis not present

## 2012-03-25 DIAGNOSIS — I130 Hypertensive heart and chronic kidney disease with heart failure and stage 1 through stage 4 chronic kidney disease, or unspecified chronic kidney disease: Secondary | ICD-10-CM | POA: Diagnosis not present

## 2012-03-25 DIAGNOSIS — N189 Chronic kidney disease, unspecified: Secondary | ICD-10-CM | POA: Diagnosis not present

## 2012-03-25 DIAGNOSIS — Z9889 Other specified postprocedural states: Secondary | ICD-10-CM | POA: Insufficient documentation

## 2012-03-25 DIAGNOSIS — Z8679 Personal history of other diseases of the circulatory system: Secondary | ICD-10-CM | POA: Insufficient documentation

## 2012-03-25 DIAGNOSIS — R1030 Lower abdominal pain, unspecified: Secondary | ICD-10-CM

## 2012-03-25 DIAGNOSIS — K4090 Unilateral inguinal hernia, without obstruction or gangrene, not specified as recurrent: Secondary | ICD-10-CM | POA: Diagnosis not present

## 2012-03-25 DIAGNOSIS — R1032 Left lower quadrant pain: Secondary | ICD-10-CM | POA: Diagnosis not present

## 2012-03-25 LAB — URINE MICROSCOPIC-ADD ON

## 2012-03-25 LAB — URINALYSIS, ROUTINE W REFLEX MICROSCOPIC
Glucose, UA: NEGATIVE mg/dL
Leukocytes, UA: NEGATIVE
pH: 7 (ref 5.0–8.0)

## 2012-03-25 MED ORDER — OXYCODONE-ACETAMINOPHEN 5-325 MG PO TABS: 1.0000 | ORAL_TABLET | Freq: Four times a day (QID) | ORAL | Status: DC | PRN

## 2012-03-25 MED ORDER — SODIUM CHLORIDE 0.9 % IV SOLN: Freq: Once | INTRAVENOUS | Status: DC

## 2012-03-25 MED ORDER — MORPHINE SULFATE 4 MG/ML IJ SOLN
4.0000 mg | Freq: Once | INTRAMUSCULAR | Status: AC
  Administered 2012-03-25: 4 mg via INTRAVENOUS
  Filled 2012-03-25: qty 1

## 2012-03-25 MED ORDER — ONDANSETRON HCL 4 MG/2ML IJ SOLN
4.0000 mg | Freq: Once | INTRAMUSCULAR | Status: AC
  Administered 2012-03-25: 4 mg via INTRAVENOUS
  Filled 2012-03-25: qty 2

## 2012-03-25 NOTE — ED Notes (Signed)
Left groin pain x 7 days denies dysuria hurts to stand

## 2012-03-25 NOTE — ED Provider Notes (Signed)
History   This chart was scribed for non-physician practitioner working with Mirna Mires, MD by Joline Maxcy, ED Scribe. This patient was seen in room TR08C/TR08C and the patient's care was started at 1516.   CSN: [id number]  Arrival date & time 03/25/12  1246   First MD Initiated Contact with Patient 03/25/12 1516      No chief complaint on file.   (Consider location/radiation/quality/duration/timing/severity/associated sxs/prior treatment) Patient is a 42 y.o. male presenting with groin pain.  Groin Pain This is a new problem. The current episode started more than 2 days ago. The problem occurs constantly. The problem has not changed since onset.Associated symptoms include abdominal pain. The symptoms are aggravated by standing. Nothing relieves the symptoms. He has tried nothing for the symptoms.    DEPAUL JACKOVICH is a 42 y.o. male who presents to the Emergency Department complaining of constant, sharp left groin pain with associated dysuria and testicular pain that began suddenly 7 days ago. He also complains of left-sided abdominal pain, primarily LLQ that radiates to the groin and is aggravated by standing or sitting. He has a h/o of hernias, but states that the current pain feels different. He denies any injury or drainage to the area. He reports that he is sexually active with a monogamous partner.   Past Medical History  Diagnosis Date  . Hypertension     Hx of HTN urgency secondary to noncompliance  . Anxiety   . GERD (gastroesophageal reflux disease)   . Acute renal failure     June 2012 felt secondary to Toradol  . Chronic renal failure   . CHF (congestive heart failure)     EF 35% 05/2011  . Cardiomegaly - hypertensive   . Smoker   . Acute diastolic heart failure 123456  . AORTIC REGURGITATION, MILD 03/11/2009    Qualifier: Diagnosis of  By: Jorene Minors, Scott    . HYPERLIPIDEMIA 08/26/2009    Qualifier: Diagnosis of  By: Jorene Minors, Scott    . INGUINAL HERNIA  02/18/2009    Qualifier: Diagnosis of  By: Jorene Minors, Scott    . MURMUR 01/16/2009    Qualifier: Diagnosis of  By: Versie Starks      Past Surgical History  Procedure Date  . Hernia repair   . Ankle surgery     Family History  Problem Relation Age of Onset  . Hypertension    . Colon cancer Paternal Uncle     History  Substance Use Topics  . Smoking status: Current Some Day Smoker -- 0.2 packs/day for 20 years    Types: Cigarettes  . Smokeless tobacco: Never Used  . Alcohol Use: No     Comment: used to drink 1 pint of gin a week      Review of Systems  Gastrointestinal: Positive for abdominal pain.  Genitourinary: Positive for dysuria and testicular pain.       Groin Pain  All other systems reviewed and are negative.    Allergies  Review of patient's allergies indicates no known allergies.  Home Medications   Current Outpatient Rx  Name  Route  Sig  Dispense  Refill  . AMLODIPINE BESYLATE 10 MG PO TABS   Oral   Take 10 mg by mouth daily.         [name] CARVEDILOL 25 MG PO TABS   Oral   Take 1 tablet (25 mg total) by mouth 2 (two) times daily with a meal.   60 tablet  11   . FUROSEMIDE 20 MG PO TABS   Oral   Take 20 mg by mouth daily.          [name] HYDRALAZINE HCL 100 MG PO TABS   Oral   Take 100 mg by mouth 3 (three) times daily.         [name] LISINOPRIL 20 MG PO TABS   Oral   Take 20 mg by mouth daily.         . ALBUTEROL SULFATE HFA 108 (90 BASE) MCG/ACT IN AERS   Inhalation   Inhale 2 puffs into the lungs every 6 (six) hours as needed. For shortness of breath           BP 160/103  Pulse 88  Temp 98.6 F (37 C) (Oral)  Resp 19  SpO2 99%  Physical Exam  Nursing note and vitals reviewed. Constitutional: He is oriented to person, place, and time. He appears well-developed and well-nourished. No distress.  HENT:  Head: Normocephalic and atraumatic.  Eyes: EOM are normal. Pupils are equal, round, and reactive to light.  Neck: Normal  range of motion. Neck supple. No tracheal deviation present.  Cardiovascular: Normal rate.   Pulmonary/Chest: Effort normal. No respiratory distress.  Abdominal: Soft. He exhibits no distension.  Genitourinary:       There is no inguinal hernia appreciated. He has left testicular tenderness during a chaperoned exam.  Musculoskeletal: Normal range of motion. He exhibits no edema.  Neurological: He is alert and oriented to person, place, and time.  Skin: Skin is warm and dry.  Psychiatric: He has a normal mood and affect. His behavior is normal.    ED Course  Procedures (including critical care time)  DIAGNOSTIC STUDIES: Oxygen Saturation is 100% on room air, normal by my interpretation.    COORDINATION OF CARE:  15:21- Discussed planned course of treatment with the patient, including a testicular ultrasound, UA, and pain medication, who is agreeable at this time.  15:30- Medication Orders- morphine 4 mg/mk injection 4 mg- once, ondanestron (zofran) injectio n4 mg- once.   Results for orders placed during the hospital encounter of 03/25/12  URINALYSIS, ROUTINE W REFLEX MICROSCOPIC      Component Value Range   Color, Urine YELLOW  YELLOW   APPearance CLEAR  CLEAR   Specific Gravity, Urine 1.020  1.005 - 1.030   pH 7.0  5.0 - 8.0   Glucose, UA NEGATIVE  NEGATIVE mg/dL   Hgb urine dipstick NEGATIVE  NEGATIVE   Bilirubin Urine NEGATIVE  NEGATIVE   Ketones, ur NEGATIVE  NEGATIVE mg/dL   Protein, ur 100 (*) NEGATIVE mg/dL   Urobilinogen, UA 0.2  0.0 - 1.0 mg/dL   Nitrite NEGATIVE  NEGATIVE   Leukocytes, UA NEGATIVE  NEGATIVE  URINE MICROSCOPIC-ADD ON      Component Value Range   Squamous Epithelial / LPF RARE  RARE   WBC, UA 0-2  <3 WBC/hpf   Bacteria, UA RARE  RARE   Urine-Other MUCOUS PRESENT      Labs Reviewed  URINALYSIS, ROUTINE W REFLEX MICROSCOPIC - Abnormal; Notable for the following:    Protein, ur 100 (*)     All other components within normal limits  URINE  MICROSCOPIC-ADD ON   US Scrotum  03/25/2012  *RADIOLOGY REPORT*  Clinical Data:  Left testicular pain  SCROTAL ULTRASOUND DOPPLER ULTRASOUND OF THE TESTICLES  Technique: Complete ultrasound examination of the testicles, epididymis, and other scrotal structures was performed.  Color and spectral  Doppler ultrasound were also utilized to evaluate blood flow to the testicles.  Comparison:  None Correlation:  CT pelvis 01/24/2012  Findings:  Right testis:  5.1 x 2.6 x 3.9 cm.  Normal morphology without mass or calcification.  Internal blood flow present on color Doppler imaging.  Left testis:  4.4 x 2.0 x 3.8 cm.  Normal morphology without mass or calcification.  Internal blood flow present on color Doppler imaging.  Right epididymis:  Complicated cyst 12 x 8 x 10 mm in size containing internal debris  Left epididymis:  Normal in size and appearance.  Hydrocele:  Absent bilaterally  Varicocele:  Absent bilaterally  Pulsed Doppler interrogation of both testes demonstrates intratesticular venous and low resistance arterial flow bilaterally.  Question left inguinal hernia containing fat.  IMPRESSION: No evidence of testicular mass or torsion. Small complicated cyst right epididymis question epididymal cyst versus spermatocele. Question left inguinal hernia containing fat; this was not identified on the prior CT.   Original Report Authenticated By: Lavonia Dana, M.D.    [name] Art/ven Flow Abd Pelv Doppler  03/25/2012  *RADIOLOGY REPORT*  Clinical Data:  Left testicular pain  SCROTAL ULTRASOUND DOPPLER ULTRASOUND OF THE TESTICLES  Technique: Complete ultrasound examination of the testicles, epididymis, and other scrotal structures was performed.  Color and spectral Doppler ultrasound were also utilized to evaluate blood flow to the testicles.  Comparison:  None Correlation:  CT pelvis 01/24/2012  Findings:  Right testis:  5.1 x 2.6 x 3.9 cm.  Normal morphology without mass or calcification.  Internal blood flow present on  color Doppler imaging.  Left testis:  4.4 x 2.0 x 3.8 cm.  Normal morphology without mass or calcification.  Internal blood flow present on color Doppler imaging.  Right epididymis:  Complicated cyst 12 x 8 x 10 mm in size containing internal debris  Left epididymis:  Normal in size and appearance.  Hydrocele:  Absent bilaterally  Varicocele:  Absent bilaterally  Pulsed Doppler interrogation of both testes demonstrates intratesticular venous and low resistance arterial flow bilaterally.  Question left inguinal hernia containing fat.  IMPRESSION: No evidence of testicular mass or torsion. Small complicated cyst right epididymis question epididymal cyst versus spermatocele. Question left inguinal hernia containing fat; this was not identified on the prior CT.   Original Report Authenticated By: Lavonia Dana, M.D.      No diagnosis found.  Results reviewed, discussed with Dr. Ashok Cordia, shared with patient.  Fat containing left inguinal hernia.  Urinalysis unremarkable.  Will discharge home with pain medication, return precautions, follow-up with PCP and/or surgery.  MDM    I personally performed the services described in this documentation, which was scribed in my presence. The recorded information has been reviewed and is accurate.       Norman Herrlich, NP 03/26/12 [phone]

## 2012-03-25 NOTE — ED Notes (Signed)
Patient transported to Ultrasound 

## 2012-03-27 NOTE — ED Provider Notes (Signed)
Medical screening examination/treatment/procedure(s) were conducted as a shared visit with non-physician practitioner(s) and myself.  I personally evaluated the patient during the encounter Pt c/o left groin and scrotal pain. No mass felt. No incarc hernia. abd soft nt. U/s.   Mirna Mires, MD 03/27/12 [phone]

## 2012-04-08 IMAGING — CR DG CHEST 2V
2 series · 2 of 2 positions shown · non-contrast
Comparison: 02/27/2009 and 02/25/2009.

CLINICAL DATA: Headache, shortness of breath and cough.

CHEST - 2 VIEW

[w chest pa]
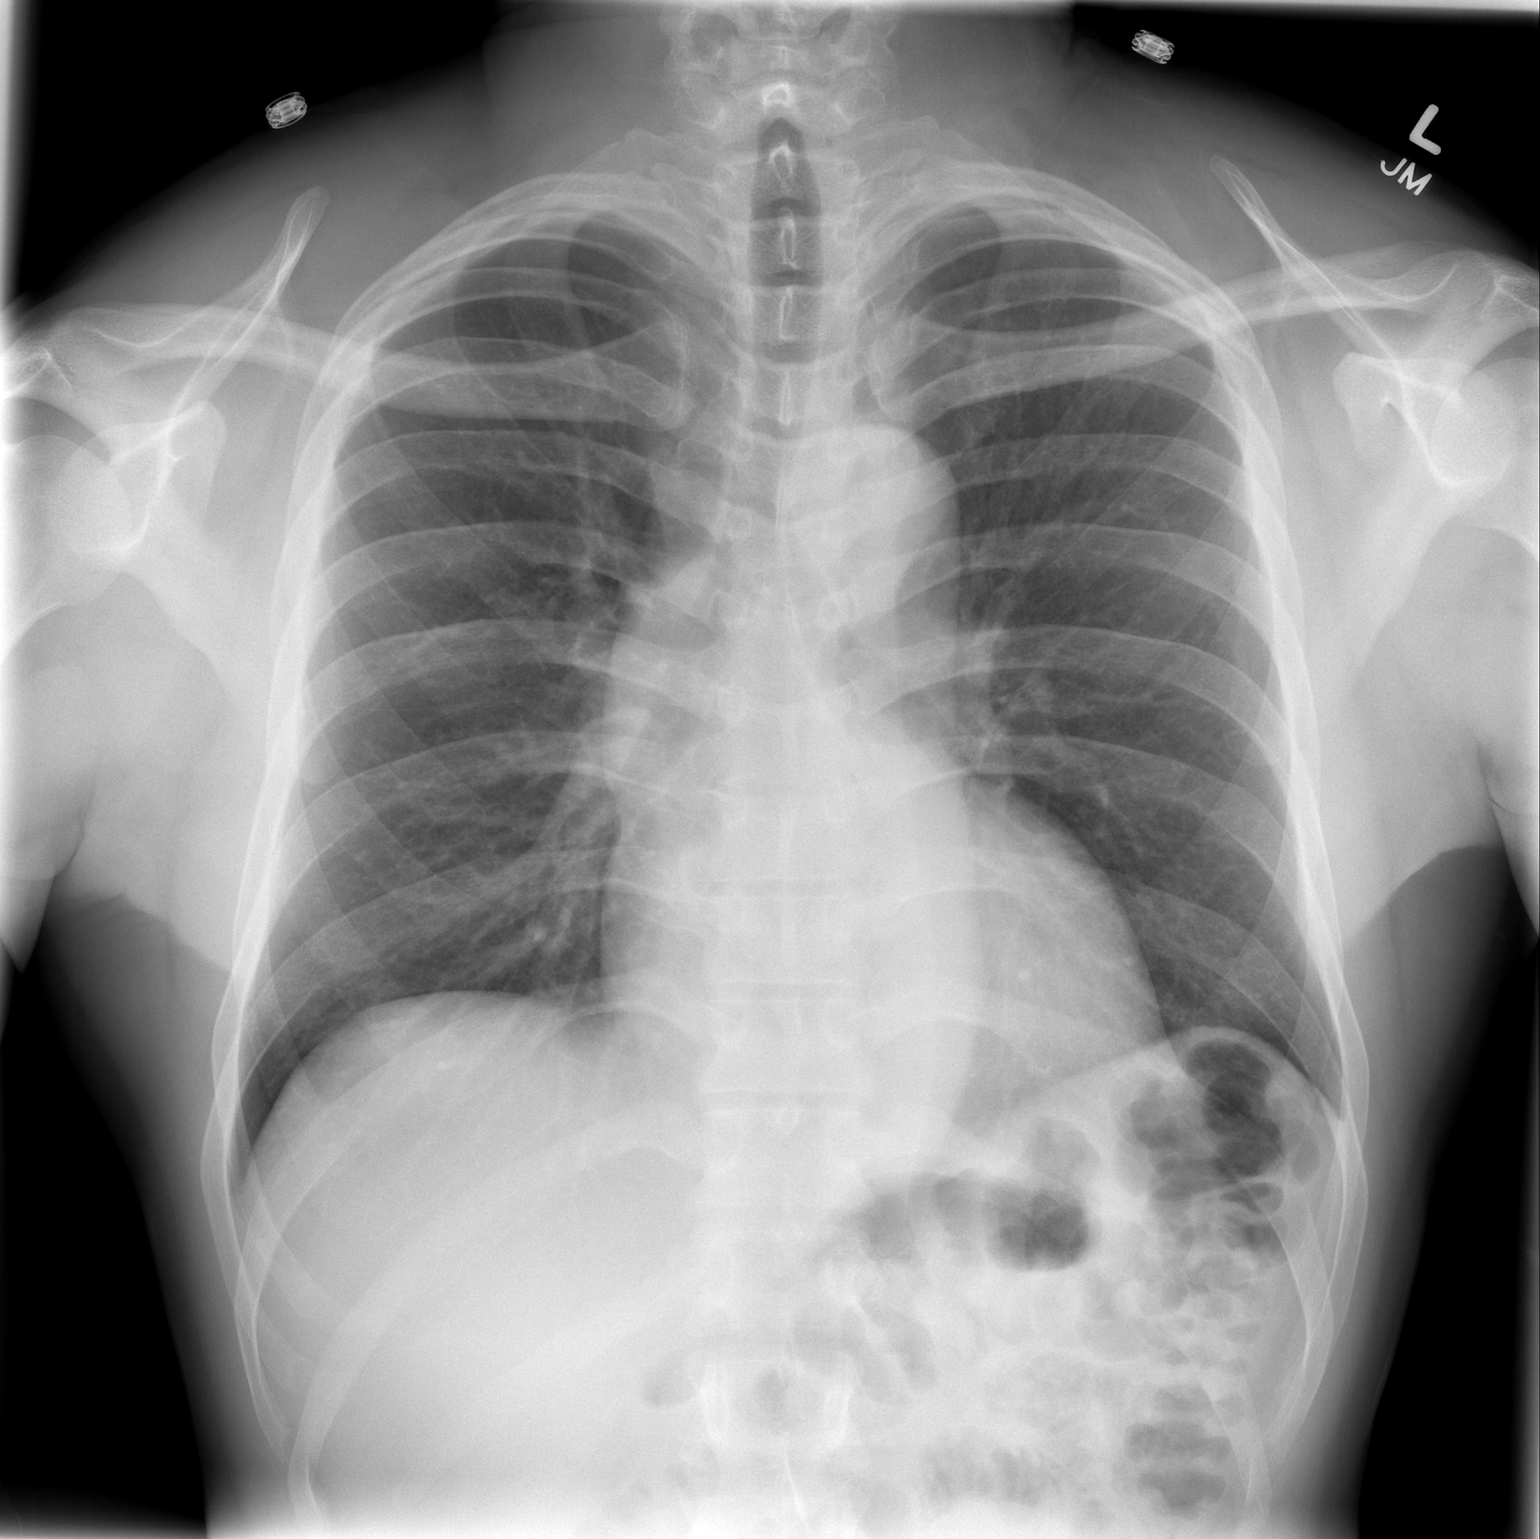

[w chest lat]
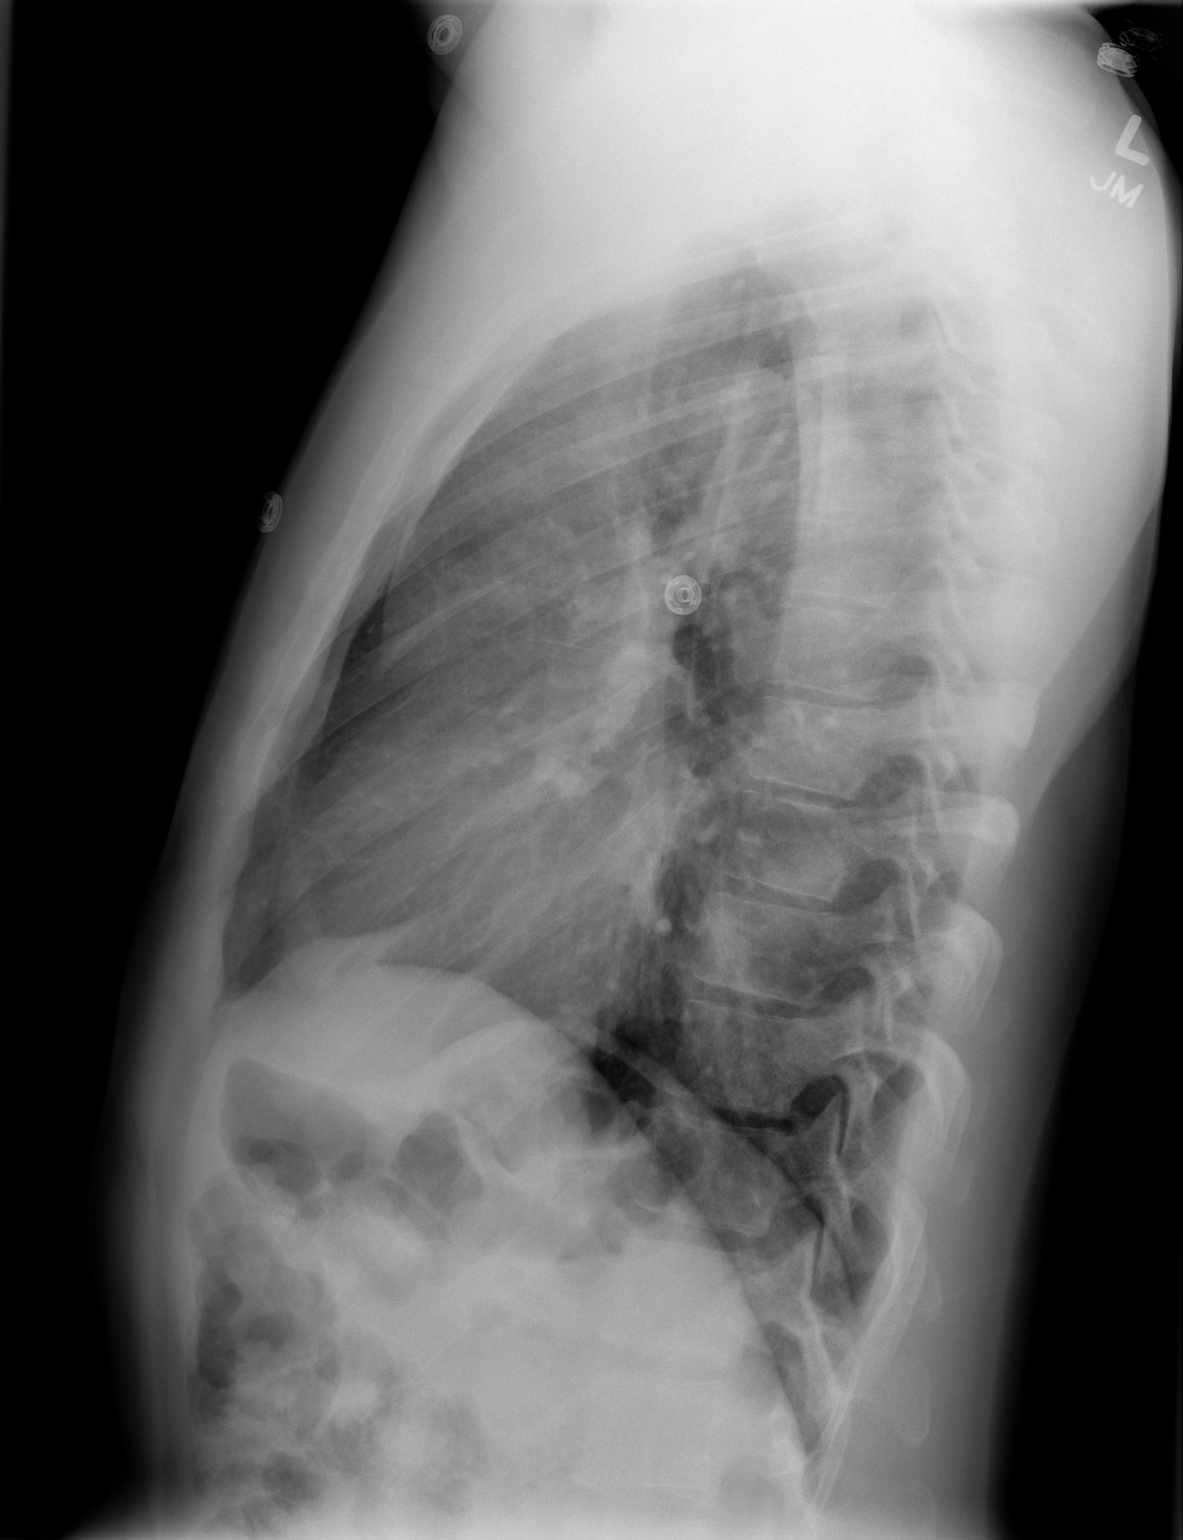

[2 of 2 positions shown; findings below may reference images not displayed]

FINDINGS: There is interval improved aeration of the lungs.  The
lung bases are now clear.  There is no pleural effusion.  The heart
size and mediastinal contours are stable with prominent tortuosity
of the aortic arch for age.
IMPRESSION: No acute cardiopulmonary process.  Tortuous aorta.

## 2012-04-08 IMAGING — CT CT HEAD W/O CM
1 of 2 series · 13 of 30 positions shown, 17 images · non-contrast
Comparison: Head CT dated 01/28/2009

CLINICAL DATA: 39-year-old with migraines and shortness of breath.

CT HEAD WITHOUT CONTRAST
TECHNIQUE: Contiguous axial images were obtained from the base of
the skull through the vertex without contrast.

[Series 2: brain · axial · 0.47mm/px · z∈[+166,+307]mm · 13 of 32 slices shown, 17 images]
[im 3/32  brain]
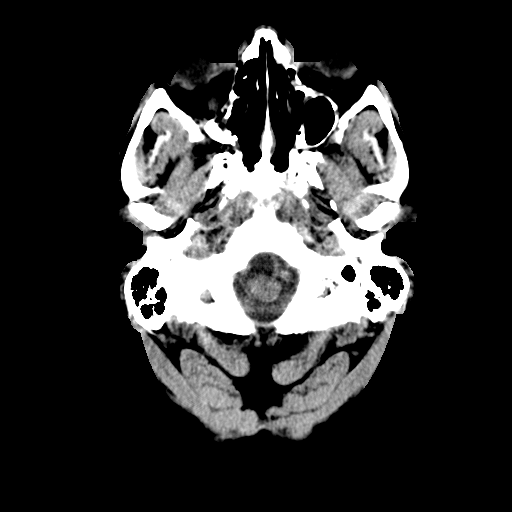
[im 3/32  bone]
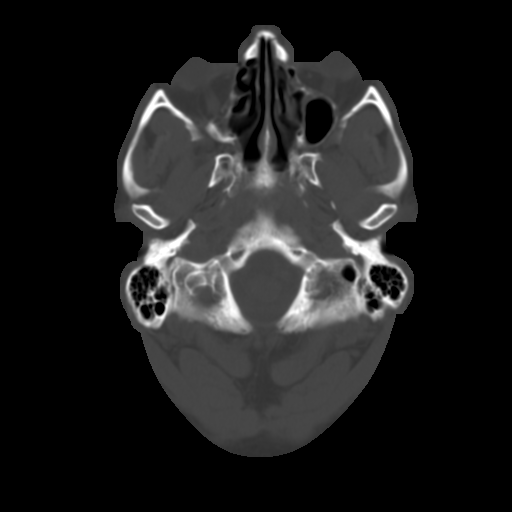
[im 5/32  brain]
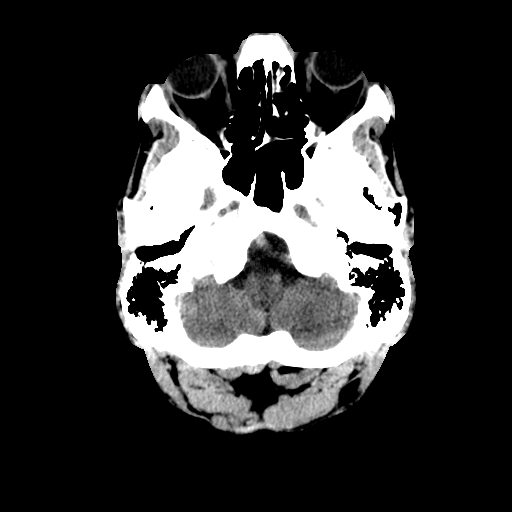
[im 7/32  brain]
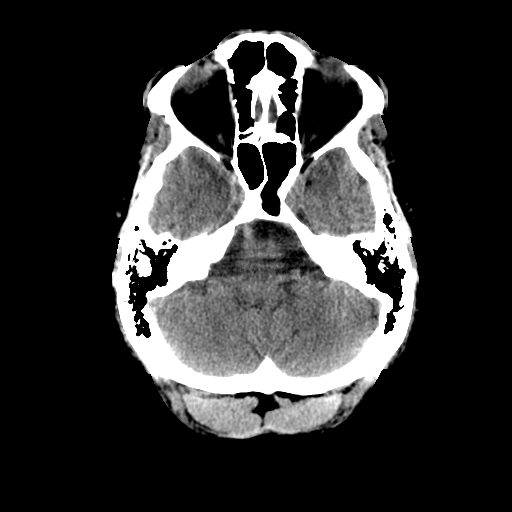
[im 9/32  brain]
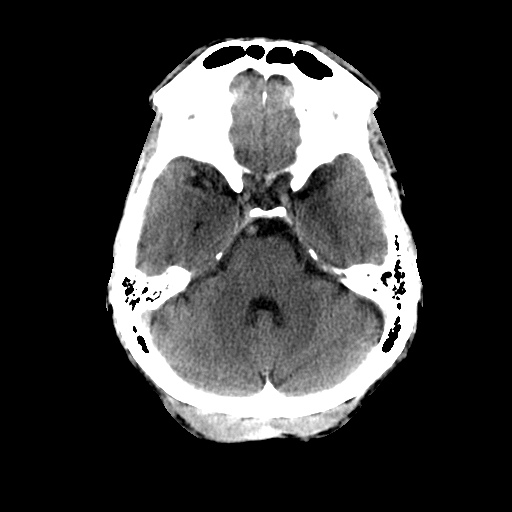
[im 12/32  brain]
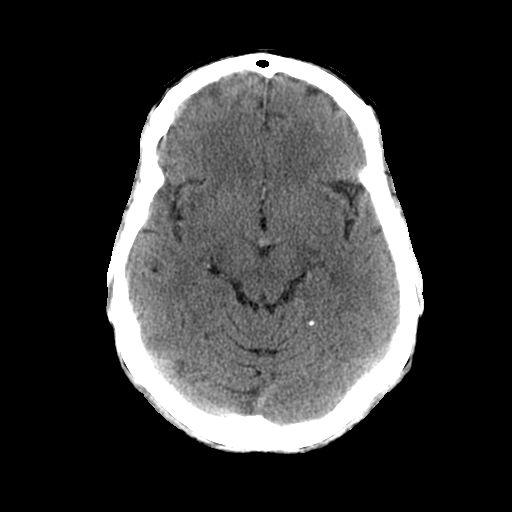
[im 12/32  bone]
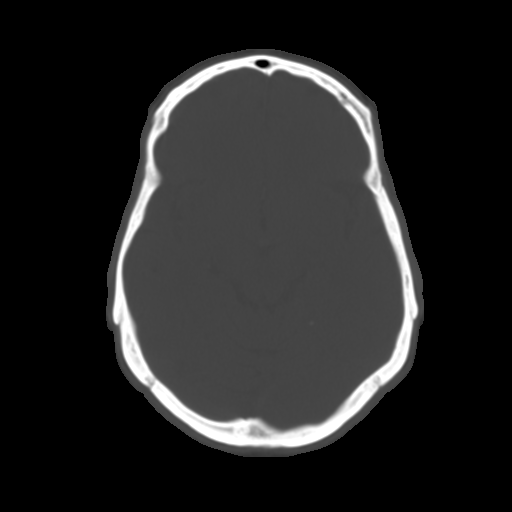
[im 14/32  brain]
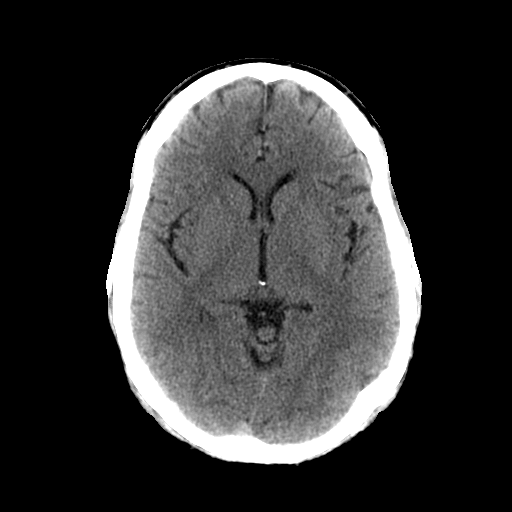
[im 16/32  brain]
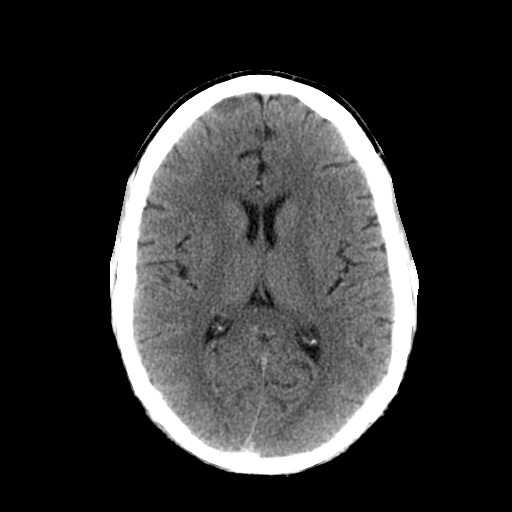
[im 18/32  brain]
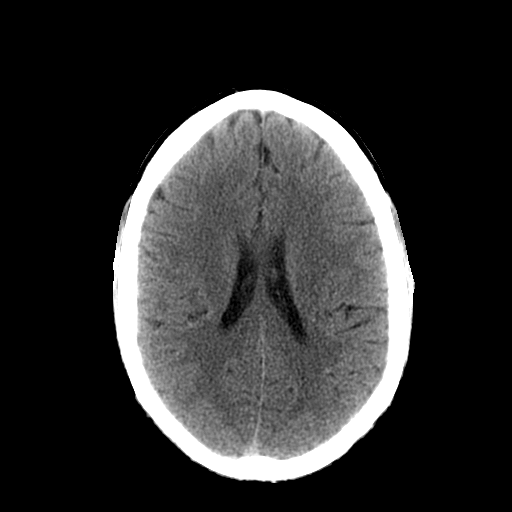
[im 20/32  brain]
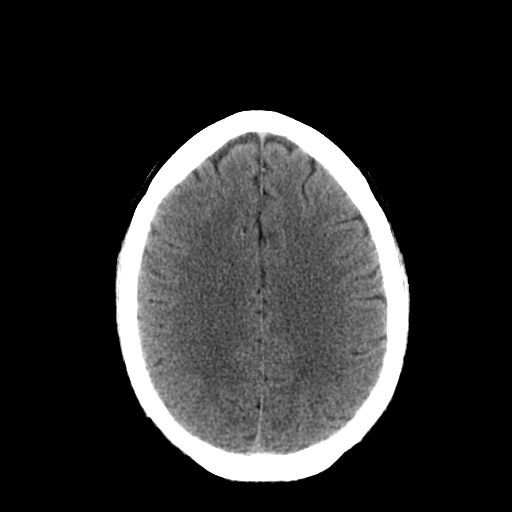
[im 20/32  bone]
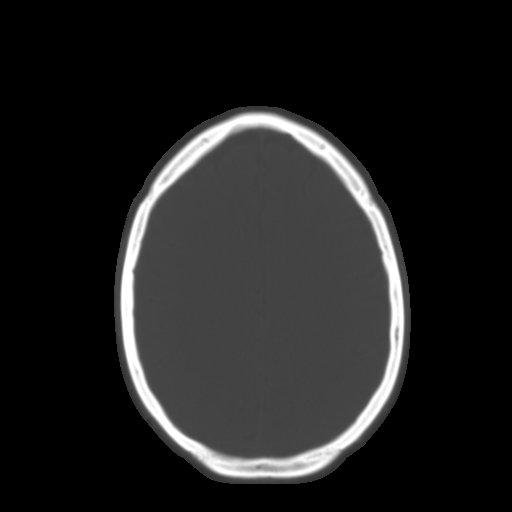
[im 23/32  brain]
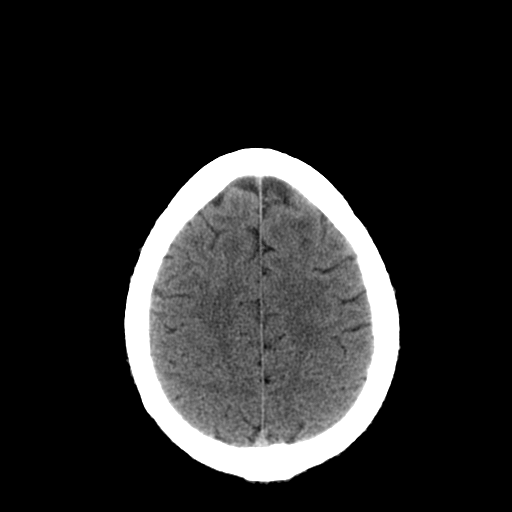
[im 25/32  brain]
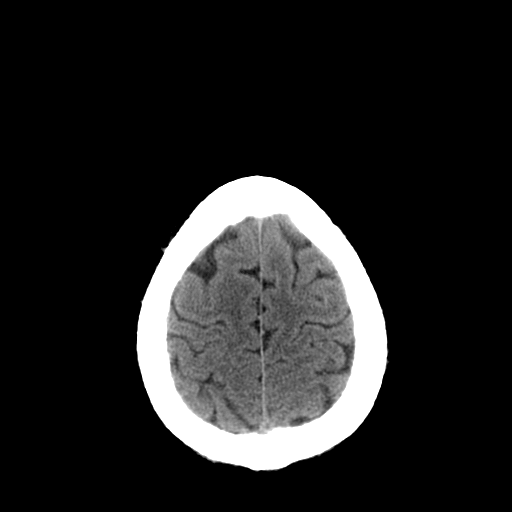
[im 27/32  brain]
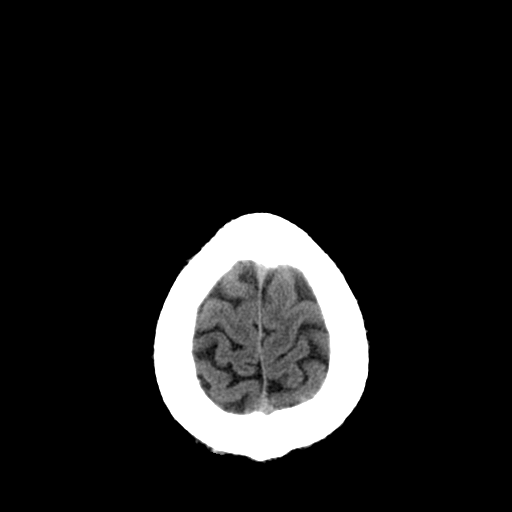
[im 29/32  brain]
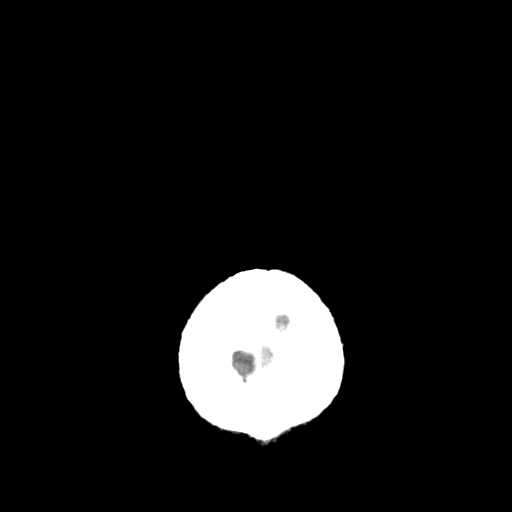
[im 29/32  bone]
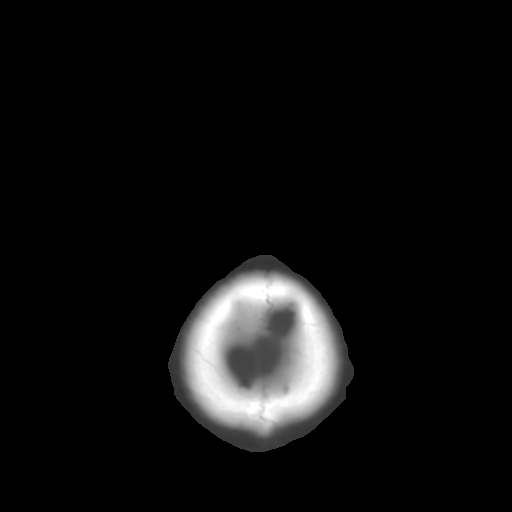

[13 of 30 positions shown; findings below may reference images not displayed]

FINDINGS: Normal appearance of the intracranial structures.  The
left vertebral artery appears to be tortuous and similar to the
previous examination.  No evidence for acute hemorrhage, mass
lesion, midline shift, hydrocephalus or large infarct.  There is
opacification in the right maxillary sinus suggesting mucosal
disease or a polyp.  There is stable deformity of the medial right
orbital wall suggesting an old injury.  There is mucosal disease in
the left ethmoid air cells.
IMPRESSION: No acute intracranial abnormality.

Paranasal sinus disease and chronic injury to the medial right
orbital wall.

## 2012-04-17 IMAGING — CR DG CHEST 1V PORT
1 series · 1 of 1 positions shown · non-contrast
Comparison: 08/11/2009

CLINICAL DATA: Chest pain

PORTABLE CHEST - 1 VIEW

[view not recorded]
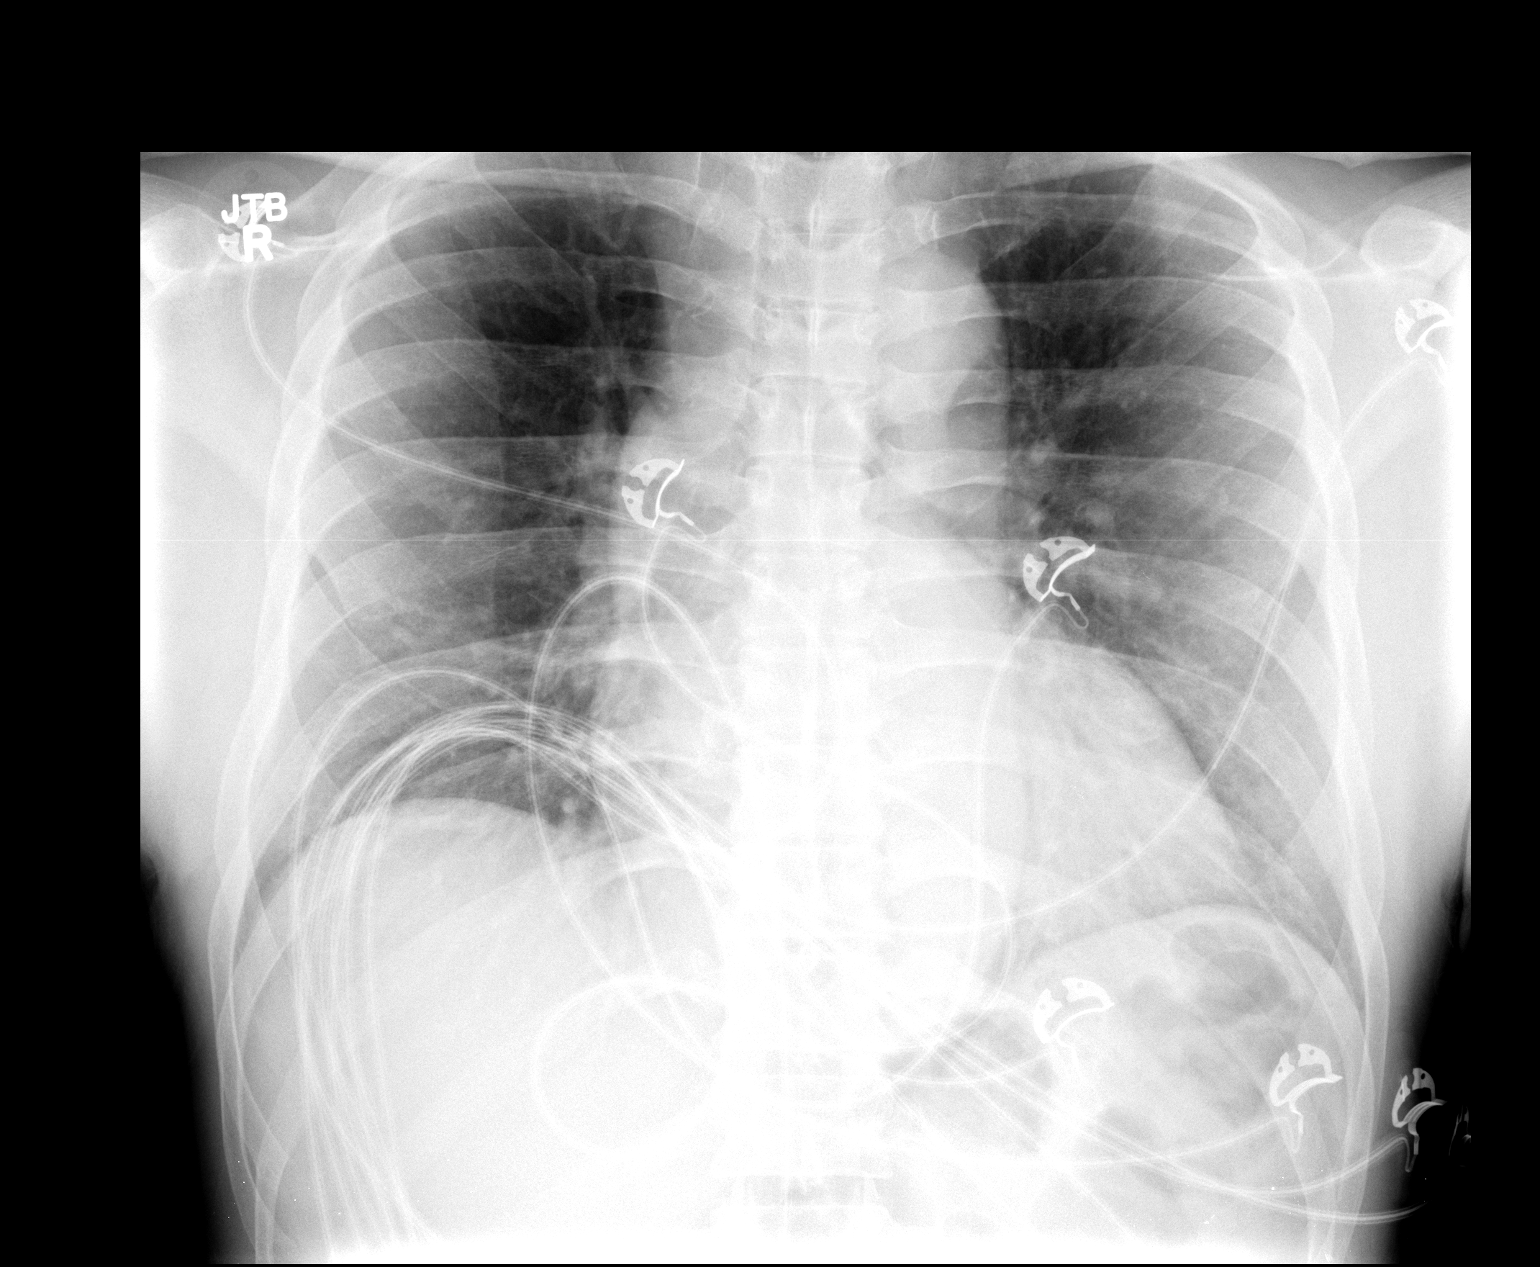

[1 of 1 positions shown; findings below may reference images not displayed]

FINDINGS: Cardiomediastinal silhouette is stable.  No acute
infiltrate or pleural effusion.  No pulmonary edema.  Bony thorax
is stable.
IMPRESSION: No active disease.  No significant change.

## 2012-07-21 ENCOUNTER — Other Ambulatory Visit: Payer: Self-pay | Admitting: *Deleted

## 2012-07-21 ENCOUNTER — Telehealth: Payer: Self-pay | Admitting: Cardiology

## 2012-07-21 MED ORDER — HYDRALAZINE HCL 100 MG PO TABS: 100.0000 mg | ORAL_TABLET | Freq: Three times a day (TID) | ORAL | Status: DC

## 2012-07-21 MED ORDER — FUROSEMIDE 20 MG PO TABS: 20.0000 mg | ORAL_TABLET | Freq: Every day | ORAL | Status: DC

## 2012-07-21 MED ORDER — AMLODIPINE BESYLATE 10 MG PO TABS: 10.0000 mg | ORAL_TABLET | Freq: Every day | ORAL | Status: DC

## 2012-07-21 MED ORDER — LISINOPRIL 20 MG PO TABS: 20.0000 mg | ORAL_TABLET | Freq: Every day | ORAL | Status: DC

## 2012-07-21 MED ORDER — CARVEDILOL 25 MG PO TABS: 25.0000 mg | ORAL_TABLET | Freq: Two times a day (BID) | ORAL | Status: DC

## 2012-07-21 NOTE — Telephone Encounter (Signed)
Meds refilled per pt request
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
## 2012-09-19 ENCOUNTER — Emergency Department (HOSPITAL_COMMUNITY)
Admission: EM | Admit: 2012-09-19 | Discharge: 2012-09-19 | Disposition: A | Discharge status: Home / Self Care | Payer: Medicare Other | Attending: Emergency Medicine | Admitting: Emergency Medicine

## 2012-09-19 ENCOUNTER — Other Ambulatory Visit: Payer: Self-pay | Admitting: Physician Assistant

## 2012-09-19 ENCOUNTER — Emergency Department (HOSPITAL_COMMUNITY): Payer: Medicare Other

## 2012-09-19 ENCOUNTER — Encounter (HOSPITAL_COMMUNITY): Payer: Self-pay | Admitting: Emergency Medicine

## 2012-09-19 DIAGNOSIS — N289 Disorder of kidney and ureter, unspecified: Secondary | ICD-10-CM

## 2012-09-19 DIAGNOSIS — F411 Generalized anxiety disorder: Secondary | ICD-10-CM | POA: Insufficient documentation

## 2012-09-19 DIAGNOSIS — R079 Chest pain, unspecified: Secondary | ICD-10-CM | POA: Diagnosis not present

## 2012-09-19 DIAGNOSIS — N189 Chronic kidney disease, unspecified: Secondary | ICD-10-CM | POA: Insufficient documentation

## 2012-09-19 DIAGNOSIS — I10 Essential (primary) hypertension: Secondary | ICD-10-CM | POA: Diagnosis not present

## 2012-09-19 DIAGNOSIS — R0789 Other chest pain: Secondary | ICD-10-CM | POA: Diagnosis not present

## 2012-09-19 DIAGNOSIS — Z79899 Other long term (current) drug therapy: Secondary | ICD-10-CM | POA: Insufficient documentation

## 2012-09-19 DIAGNOSIS — F1210 Cannabis abuse, uncomplicated: Secondary | ICD-10-CM | POA: Insufficient documentation

## 2012-09-19 DIAGNOSIS — I129 Hypertensive chronic kidney disease with stage 1 through stage 4 chronic kidney disease, or unspecified chronic kidney disease: Secondary | ICD-10-CM | POA: Insufficient documentation

## 2012-09-19 DIAGNOSIS — R1013 Epigastric pain: Secondary | ICD-10-CM | POA: Insufficient documentation

## 2012-09-19 DIAGNOSIS — K219 Gastro-esophageal reflux disease without esophagitis: Secondary | ICD-10-CM | POA: Insufficient documentation

## 2012-09-19 DIAGNOSIS — M549 Dorsalgia, unspecified: Secondary | ICD-10-CM | POA: Insufficient documentation

## 2012-09-19 DIAGNOSIS — J9819 Other pulmonary collapse: Secondary | ICD-10-CM | POA: Diagnosis not present

## 2012-09-19 DIAGNOSIS — Z8719 Personal history of other diseases of the digestive system: Secondary | ICD-10-CM | POA: Insufficient documentation

## 2012-09-19 DIAGNOSIS — R110 Nausea: Secondary | ICD-10-CM | POA: Insufficient documentation

## 2012-09-19 DIAGNOSIS — I428 Other cardiomyopathies: Secondary | ICD-10-CM | POA: Insufficient documentation

## 2012-09-19 DIAGNOSIS — E785 Hyperlipidemia, unspecified: Secondary | ICD-10-CM | POA: Insufficient documentation

## 2012-09-19 DIAGNOSIS — I509 Heart failure, unspecified: Secondary | ICD-10-CM | POA: Insufficient documentation

## 2012-09-19 DIAGNOSIS — F17200 Nicotine dependence, unspecified, uncomplicated: Secondary | ICD-10-CM | POA: Insufficient documentation

## 2012-09-19 DIAGNOSIS — I38 Endocarditis, valve unspecified: Secondary | ICD-10-CM | POA: Insufficient documentation

## 2012-09-19 DIAGNOSIS — F1010 Alcohol abuse, uncomplicated: Secondary | ICD-10-CM | POA: Insufficient documentation

## 2012-09-19 DIAGNOSIS — R231 Pallor: Secondary | ICD-10-CM | POA: Insufficient documentation

## 2012-09-19 HISTORY — DX: Alcohol abuse, uncomplicated: F10.10

## 2012-09-19 HISTORY — DX: Endocarditis, valve unspecified: I38

## 2012-09-19 HISTORY — DX: Chronic kidney disease, unspecified: N18.9

## 2012-09-19 HISTORY — DX: Cardiomyopathy, unspecified: I42.9

## 2012-09-19 HISTORY — DX: Tobacco use: Z72.0

## 2012-09-19 LAB — COMPREHENSIVE METABOLIC PANEL
AST: 19 U/L (ref 0–37)
Albumin: 3.5 g/dL (ref 3.5–5.2)
Alkaline Phosphatase: 77 U/L (ref 39–117)
BUN: 10 mg/dL (ref 6–23)
CO2: 24 mEq/L (ref 19–32)
Chloride: 104 mEq/L (ref 96–112)
Creatinine, Ser: 1.37 mg/dL — ABNORMAL HIGH (ref 0.50–1.35)
GFR calc non Af Amer: 63 mL/min — ABNORMAL LOW (ref >90)
Potassium: 3.8 mEq/L (ref 3.5–5.1)
Total Bilirubin: 0.4 mg/dL (ref 0.3–1.2)

## 2012-09-19 LAB — CBC
HCT: 38.5 % — ABNORMAL LOW (ref 39.0–52.0)
MCV: 90.4 fL (ref 78.0–100.0)
RBC: 4.26 MIL/uL (ref 4.22–5.81)
RDW: 15 % (ref 11.5–15.5)
WBC: 5.1 10*3/uL (ref 4.0–10.5)

## 2012-09-19 LAB — TROPONIN I: Troponin I: <0.3 ng/mL (ref <0.30)

## 2012-09-19 MED ORDER — LABETALOL HCL 5 MG/ML IV SOLN
10.0000 mg | Freq: Once | INTRAVENOUS | Status: AC
  Administered 2012-09-19: 10 mg via INTRAVENOUS

## 2012-09-19 MED ORDER — SODIUM CHLORIDE 0.9 % IV BOLUS (SEPSIS): 500.0000 mL | Freq: Once | INTRAVENOUS | Status: DC

## 2012-09-19 MED ORDER — SODIUM CHLORIDE 0.9 % IV BOLUS (SEPSIS)
500.0000 mL | Freq: Once | INTRAVENOUS | Status: AC
  Administered 2012-09-19: 500 mL via INTRAVENOUS

## 2012-09-19 MED ORDER — IOHEXOL 300 MG/ML  SOLN
80.0000 mL | Freq: Once | INTRAMUSCULAR | Status: AC | PRN
  Administered 2012-09-19: 80 mL via INTRAVENOUS

## 2012-09-19 MED ORDER — HYDRALAZINE HCL 25 MG PO TABS
25.0000 mg | ORAL_TABLET | Freq: Once | ORAL | Status: AC
  Administered 2012-09-19: 25 mg via ORAL
  Filled 2012-09-19: qty 1

## 2012-09-19 MED ORDER — MORPHINE SULFATE 4 MG/ML IJ SOLN
4.0000 mg | Freq: Once | INTRAMUSCULAR | Status: AC
  Administered 2012-09-19: 4 mg via INTRAVENOUS
  Filled 2012-09-19: qty 1

## 2012-09-19 MED ORDER — IOHEXOL 350 MG/ML SOLN: 100.0000 mL | Freq: Once | INTRAVENOUS | Status: DC | PRN

## 2012-09-19 MED ORDER — HYDROCODONE-ACETAMINOPHEN 5-325 MG PO TABS: 2.0000 | ORAL_TABLET | ORAL | Status: DC | PRN

## 2012-09-19 MED ORDER — LABETALOL HCL 5 MG/ML IV SOLN
10.0000 mg | Freq: Once | INTRAVENOUS | Status: AC
  Administered 2012-09-19: 10 mg via INTRAVENOUS
  Filled 2012-09-19: qty 4

## 2012-09-19 MED ORDER — HYDROCODONE-ACETAMINOPHEN 5-325 MG PO TABS
2.0000 | ORAL_TABLET | Freq: Once | ORAL | Status: AC
  Administered 2012-09-19: 2 via ORAL
  Filled 2012-09-19: qty 2

## 2012-09-19 MED ORDER — HYDRALAZINE HCL 25 MG PO TABS: 25.0000 mg | ORAL_TABLET | Freq: Three times a day (TID) | ORAL | Status: DC

## 2012-09-19 NOTE — ED Notes (Signed)
Pt has returned from being out of the department; pt placed back on monitor, continuous pulse oximetry and blood pressure cuff 

## 2012-09-19 NOTE — ED Notes (Signed)
MD Jacubowitz at bedside.  

## 2012-09-19 NOTE — Progress Notes (Signed)
Per discussion with Dr. Stanford Breed, I have left message on our voicemail scheduling line to have pt come to office for BMET in 48 hours and f/u [facility] in 2-4 weeks. Our office will call patient with these appointments. Mercadez Heitman PA-C

## 2012-09-19 NOTE — ED Notes (Signed)
Street, Resident MD at bedside
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
## 2012-09-19 NOTE — Consult Note (Signed)
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 Echocardiogram in March of 2012 showed an ejection fraction of 35-40% and moderate to severe mitral regurgitation. Cardiomyopathy was felt secondary to hypertension and alcohol. Myoview in April of 2013 showed an ejection fraction of 37% and no ischemia or infarction. Echocardiogram repeated in November of 2013. The patient's ejection fraction was 45-50%. There was mild to moderate aortic insufficiency and a mildly dilated aortic root. There was mild mitral regurgitation, mild right atrial and right ventricular enlargement. Patient presented to the emergency room today with complaints of chest pain. It began yesterday at approximately 5 PM. It is a sharp pain in the left breast area radiating to his left ribs and back. It also radiates to his left upper abdomen. The pain has been continuous without completely resolving. He has had nausea/vomiting but denies shortness of breath or diaphoresis. The pain increases with moving his left upper extremity and deep inspiration. Cardiology now asked to evaluate. The patient states he has been compliant with his medications.   (Not in a hospital admission)  No Known Allergies  Past Medical History  Diagnosis Date  . Hypertension     a. Hx of HTN urgency secondary to noncompliance. b. urinary metanephrine and catecholeamine levels normal 2013.  [name] Anxiety   . GERD (gastroesophageal reflux disease)   . Acute renal failure     June 2012 felt secondary to Toradol  . Chronic renal failure   . CHF (congestive heart failure)     a. 2013: Adm with pulm edema/HTN urgency, EF 35-40% with diffuse hypokinesis and moderate to severe mitral regurgitation. Cardiomyopathy was thought to be most likely due to HTN and ETOH abuse, cath deferred due to renal insufficiency (felt due to uncontrolled HTN). bJodie Echevaria MV 06/2011: EF 37% and no ischemia or infarction.  . Cardiomyopathy   . AORTIC REGURGITATION,  MILD   . HYPERLIPIDEMIA   . INGUINAL HERNIA   . ETOH abuse   . Valvular heart disease     a. Echo in 3/13 showed moderate to severe eccentric MR and mild to moderate AI with prolapsing left coronary cusp.    . CKD (chronic kidney disease)     a. 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    Past Surgical History  Procedure Laterality Date  . Hernia repair    . Ankle surgery      Fractures bilaterally    History   Social History  . Marital Status: Married    Spouse Name: N/A    Number of Children: 2  . Years of Education: N/A   Occupational History  .      Disability   Social History Main Topics  . Smoking status: Current Some Day Smoker -- 0.25 packs/day for 20 years    Types: Cigarettes  . Smokeless tobacco: Never Used  . Alcohol Use: Yes     Comment: used to drink 1 pint of gin a week  . Drug Use: Yes    Special: Marijuana     Comment: twice a month  . Sexually Active: Yes   Other Topics Concern  . Not on file   Social History Narrative  . No narrative on file    Family History  Problem Relation Age of Onset  . Hypertension    . Colon cancer Paternal Uncle     ROS:  The patient describes a cough productive of yellow phlegm but no fevers or chills, hemoptysis, dysphasia, odynophagia, melena, hematochezia,  dysuria, hematuria, rash, seizure activity, orthopnea, PND, pedal edema, claudication. Remaining systems are negative.  Physical Exam:   Blood pressure 157/110, pulse 60, temperature 98.3 F (36.8 C), resp. rate 18, SpO2 98.00%.  General:  Well developed/well nourished in NAD Skin warm/dry Patient not depressed No peripheral clubbing Back-normal HEENT-normal/normal eyelids Neck supple/normal carotid upstroke bilaterally; no bruits; no JVD; no thyromegaly chest - CTA/ normal expansion, chest is tender to palpation. CV - RRR/normal S1 and S2; no rubs or gallops;  PMI nondisplaced, 2/6 diastolic murmur left sternal border. Abdomen -NT/ND, no HSM, no mass, + bowel  sounds, no bruit 2+ femoral pulses, no bruits Ext-no edema, chords, 2+ DP Neuro-grossly nonfocal  ECG sinus rhythm, left ventricular hypertrophy with repolarization abnormality.  Results for orders placed during the hospital encounter of 09/19/12 (from the past 48 hour(s))  TROPONIN I     Status: None   Collection Time    09/19/12  2:16 PM      Result Value Range   Troponin I <0.30  <0.30 ng/mL   Comment:            Due to the release kinetics of cTnI,     a negative result within the first hours     of the onset of symptoms does not rule out     myocardial infarction with certainty.     If myocardial infarction is still suspected,     repeat the test at appropriate intervals.  CBC     Status: Abnormal   Collection Time    09/19/12  2:18 PM      Result Value Range   WBC 5.1  4.0 - 10.5 K/uL   RBC 4.26  4.22 - 5.81 MIL/uL   Hemoglobin 13.6  13.0 - 17.0 g/dL   HCT 38.5 (*) 39.0 - 52.0 %   MCV 90.4  78.0 - 100.0 fL   MCH 31.9  26.0 - 34.0 pg   MCHC 35.3  30.0 - 36.0 g/dL   RDW 15.0  11.5 - 15.5 %   Platelets 167  150 - 400 K/uL  COMPREHENSIVE METABOLIC PANEL     Status: Abnormal   Collection Time    09/19/12  2:18 PM      Result Value Range   Sodium 138  135 - 145 mEq/L   Potassium 3.8  3.5 - 5.1 mEq/L   Chloride 104  96 - 112 mEq/L   CO2 24  19 - 32 mEq/L   Glucose, Bld 93  70 - 99 mg/dL   BUN 10  6 - 23 mg/dL   Creatinine, Ser 1.37 (*) 0.50 - 1.35 mg/dL   Calcium 9.0  8.4 - 10.5 mg/dL   Total Protein 7.0  6.0 - 8.3 g/dL   Albumin 3.5  3.5 - 5.2 g/dL   AST 19  0 - 37 U/L   ALT 13  0 - 53 U/L   Alkaline Phosphatase 77  39 - 117 U/L   Total Bilirubin 0.4  0.3 - 1.2 mg/dL   GFR calc non Af Amer 63 (*) >90 mL/min   GFR calc Af Amer 73 (*) >90 mL/min   Comment:            The eGFR has been calculated     using the CKD EPI equation.     This calculation has not been     validated in all clinical     situations.     eGFR's persistently     <  90 mL/min signify      possible Chronic Kidney Disease.  POCT I-STAT TROPONIN I     Status: None   Collection Time    09/19/12  2:19 PM      Result Value Range   Troponin i, poc 0.01  0.00 - 0.08 ng/mL   Comment 3            Comment: Due to the release kinetics of cTnI,     a negative result within the first hours     of the onset of symptoms does not rule out     myocardial infarction with certainty.     If myocardial infarction is still suspected,     repeat the test at appropriate intervals.    Dg Chest 2 View  09/19/2012   *RADIOLOGY REPORT*  Clinical Data: Chest pain  CHEST - 2 VIEW  Comparison: 01/24/2012  Findings: Two-view exam of the chest shows no edema or focal airspace consolidation.  Cardiopericardial silhouette is upper limits of normal for size.  There is new prominence of the right mediastinum. Imaged bony structures of the thorax are intact.  IMPRESSION: New right mediastinal prominence.  This may be related to enlargement of the ascending aorta, but mediastinal mass or lymphadenopathy is not excluded.  CT chest with contrast is recommended to further evaluate.   Original Report Authenticated By: Misty Stanley, M.D.    Assessment/Plan 1 chest pain-the patient symptoms are not consistent with cardiac etiology. They have been continuous for 23 hours and his enzymes are negative. They are reproduced with palpation and seemed most consistent with musculoskeletal pain by history. There is note on chest x-ray of mediastinal prominence. A chest CT with low-dose contrast has been ordered to rule out dissection (seems less likely based on history) and to rule out adenopathy. Final results are pending. 2 hypertension-the patient's blood pressure is elevated. He also has a history of cardiomyopathy and mild to moderate aortic insufficiency on most recent echo. Continue amlodipine, carvedilol, Lasix and lisinopril. Add hydralazine 25 mg by mouth 3 times a day. Followup with Dr. Aundra Dubin in 2-4 weeks for further  titration of blood pressure medications. 3 renal insufficiency-the patient is receiving IV fluids prior to his CTA which will be done with lower contrast. He will need a followup BMET in 2 days to make sure renal function is stable. 4 cardiomyopathy-continue ACE inhibitor and beta blocker. Reduced LV function previously felt secondary to uncontrolled hypertension and alcohol. Patient will need close followup for blood pressure control. 5 mild to moderate aortic insufficiency-noted on last echocardiogram. He will need followup echoes in the future.  Kirk Ruths MD 09/19/2012, 4:08 PM

## 2012-09-19 NOTE — ED Notes (Signed)
Cp since 5 pm yesterday hurts to take a deep breath hurts thru to back

## 2012-09-19 NOTE — ED Notes (Signed)
Pt reports pain increases with movement and deep inspiration.

## 2012-09-19 NOTE — ED Provider Notes (Signed)
Complains of left-sided chest pain worse with moving or changing positions for the past few days. Patient evaluated by cardiology, Dr. Stanford Breed. Felt not to be acute coronary syndrome.. CT scan report reviewed. Plan prescription for hydralazine 25 mg 3 times a day. Repeat b met 2 days can be performed cardiology office. Follow Dr. Aundra Dubin and 2 to four-week.  Orlie Dakin, MD 09/19/12 1746

## 2012-09-20 NOTE — ED Provider Notes (Signed)
I saw and evaluated the patient, reviewed the resident's note and I agree with the findings and plan.   .Face to face Exam:  General:  Awake HEENT:  Atraumatic Resp:  Normal effort Abd:  Nondistended Neuro:No focal weakness   [name] came to ED to see patient.  Dot Lanes, MD 09/20/12 [id number]

## 2012-09-21 ENCOUNTER — Other Ambulatory Visit (INDEPENDENT_AMBULATORY_CARE_PROVIDER_SITE_OTHER): Payer: Medicare Other

## 2012-09-21 DIAGNOSIS — N289 Disorder of kidney and ureter, unspecified: Secondary | ICD-10-CM

## 2012-09-21 LAB — BASIC METABOLIC PANEL
BUN: 7 mg/dL (ref 6–23)
Chloride: 104 mEq/L (ref 96–112)
Creatinine, Ser: 1.4 mg/dL (ref 0.4–1.5)
Glucose, Bld: 84 mg/dL (ref 70–99)
Potassium: 3.4 mEq/L — ABNORMAL LOW (ref 3.5–5.1)

## 2012-09-22 ENCOUNTER — Other Ambulatory Visit: Payer: Self-pay | Admitting: *Deleted

## 2012-09-22 DIAGNOSIS — E876 Hypokalemia: Secondary | ICD-10-CM

## 2012-09-22 MED ORDER — POTASSIUM CHLORIDE CRYS ER 20 MEQ PO TBCR: 20.0000 meq | EXTENDED_RELEASE_TABLET | Freq: Every day | ORAL | Status: DC

## 2012-09-29 ENCOUNTER — Other Ambulatory Visit: Payer: Medicare Other

## 2012-10-11 ENCOUNTER — Encounter: Payer: Medicare Other | Admitting: Nurse Practitioner

## 2012-11-18 IMAGING — CT CT ABD-PELV W/ CM
2 of 4 series · 17 of 46 positions shown, 19 images · IV contrast (agent unspecified)
Comparison: 01/07/2000 by report only

CLINICAL DATA: Abdominal pain

CT ABDOMEN AND PELVIS WITH CONTRAST
TECHNIQUE: Multidetector CT imaging of the abdomen and pelvis was
performed following the standard protocol during bolus
administration of intravenous contrast.
Contrast: 100 ml Qmnipaque-133 IV

[Series 2: routine · axial · 0.68mm/px · z∈[+515,+990]mm · 14 of 103 slices shown, 16 images]
[im 4/103  soft-tissue]
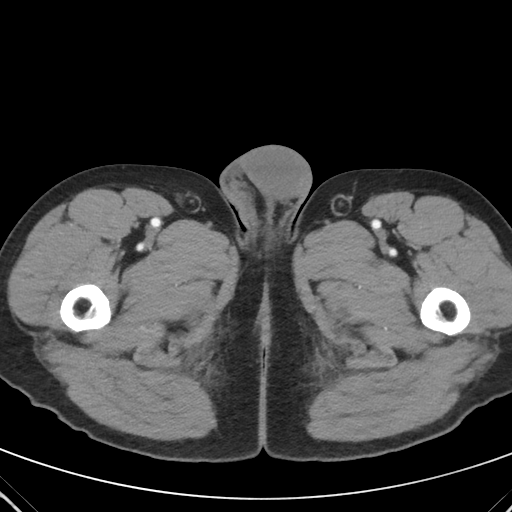
[im 4/103  bone]
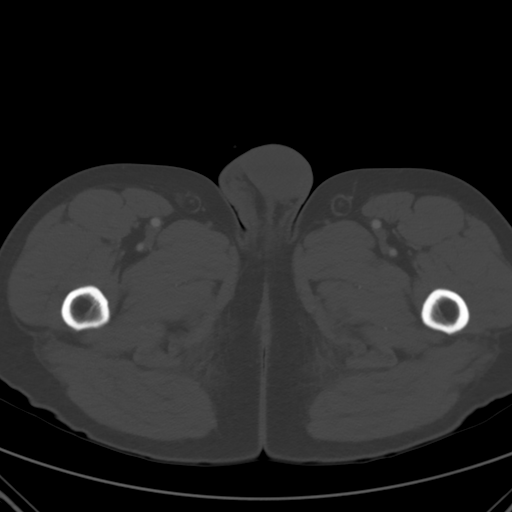
[im 12/103  soft-tissue]
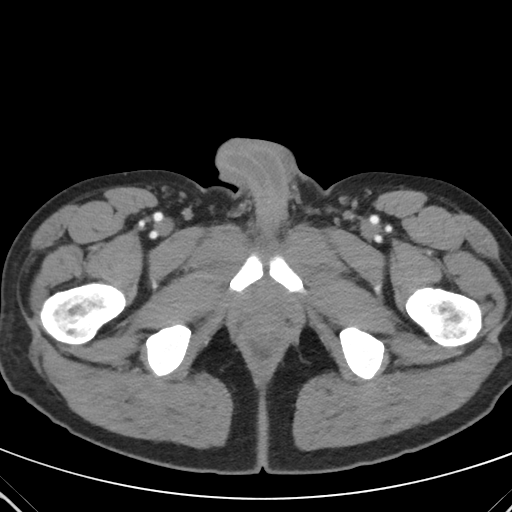
[im 20/103  soft-tissue]
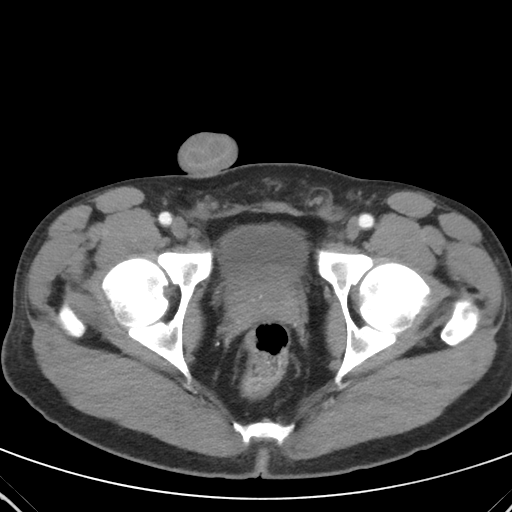
[im 28/103  soft-tissue]
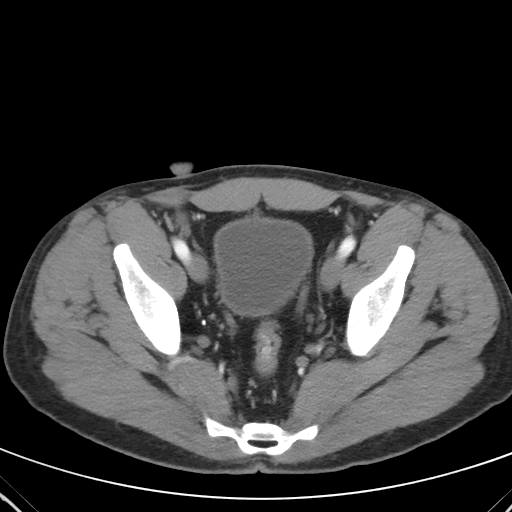
[im 36/103  soft-tissue]
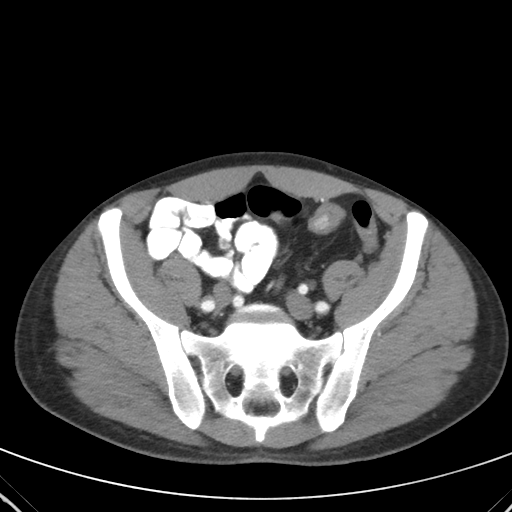
[im 40/103  soft-tissue]
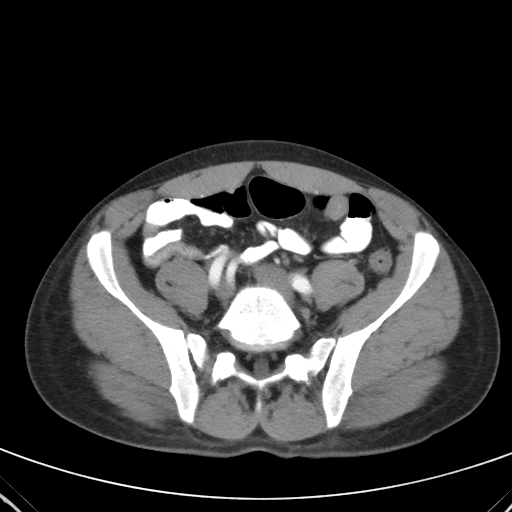
[im 48/103  soft-tissue]
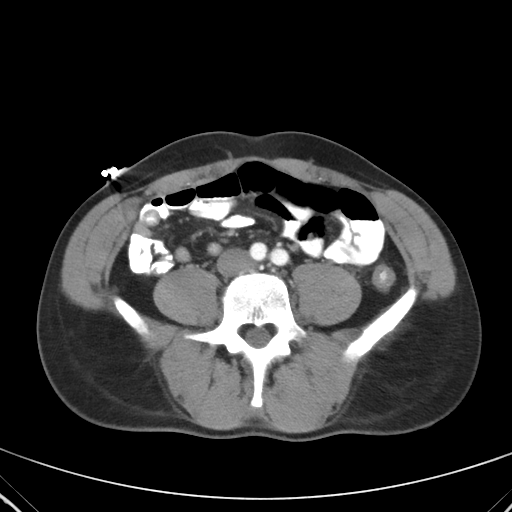
[im 55/103  soft-tissue]
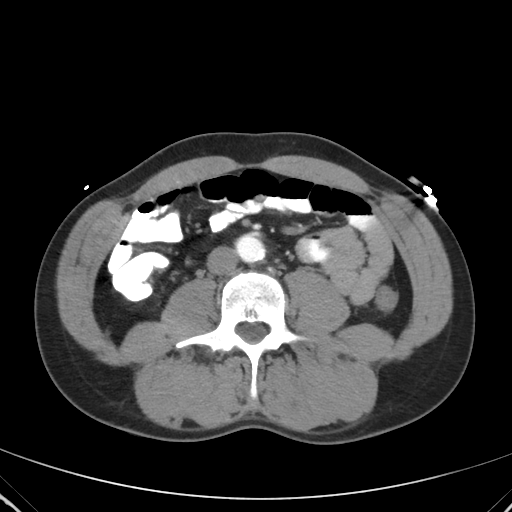
[im 63/103  soft-tissue]
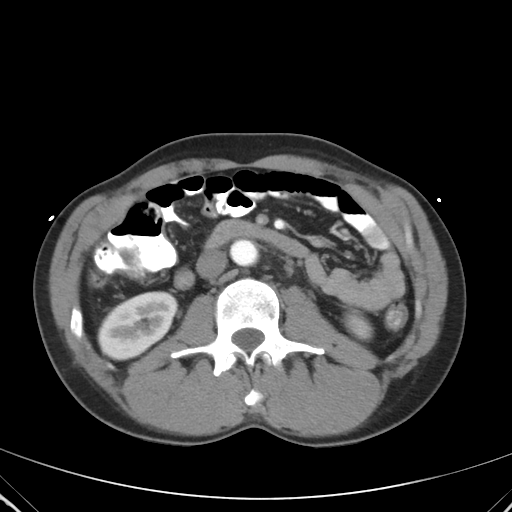
[im 63/103  bone]
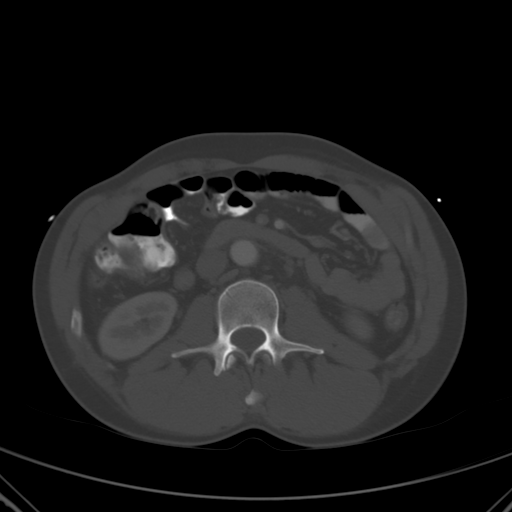
[im 67/103  soft-tissue]
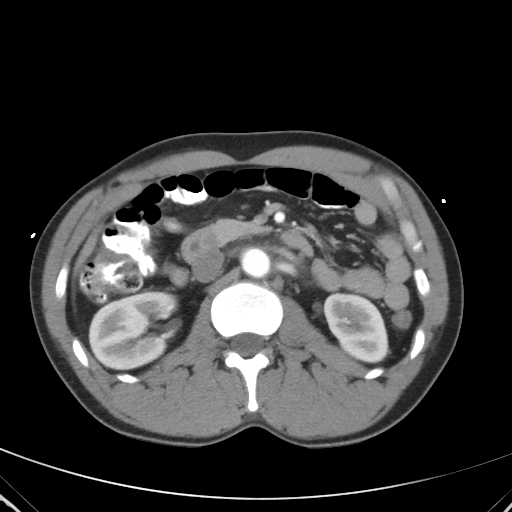
[im 75/103  soft-tissue]
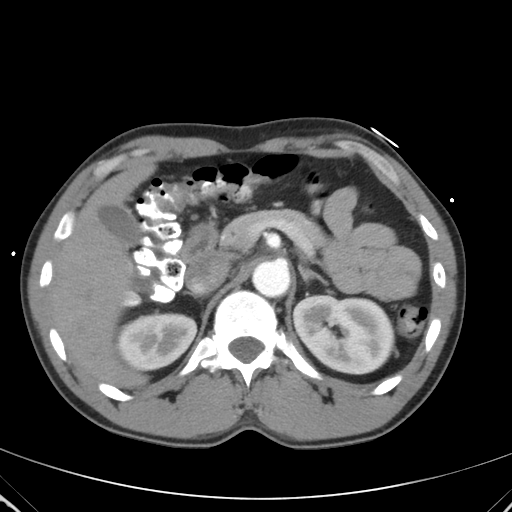
[im 83/103  soft-tissue]
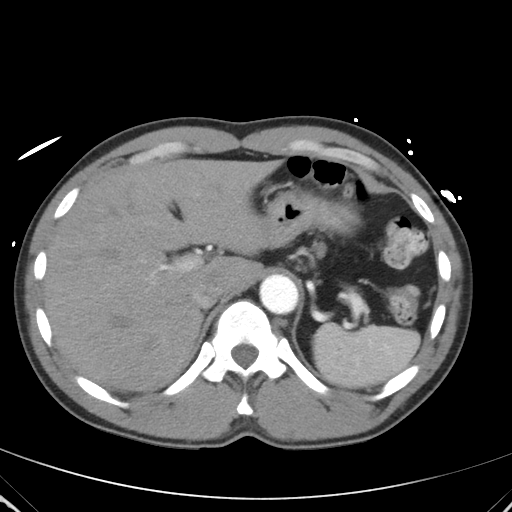
[im 91/103  soft-tissue]
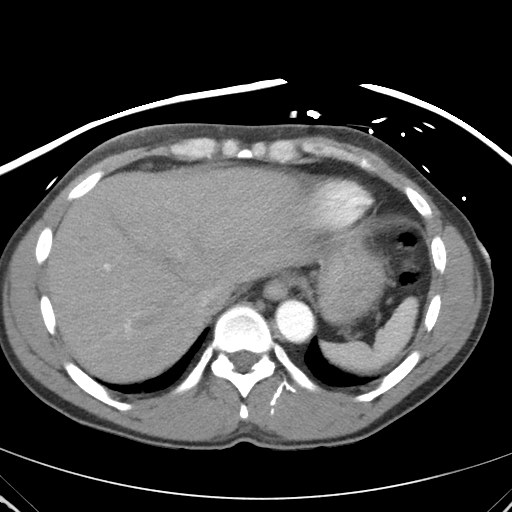
[im 99/103  soft-tissue]
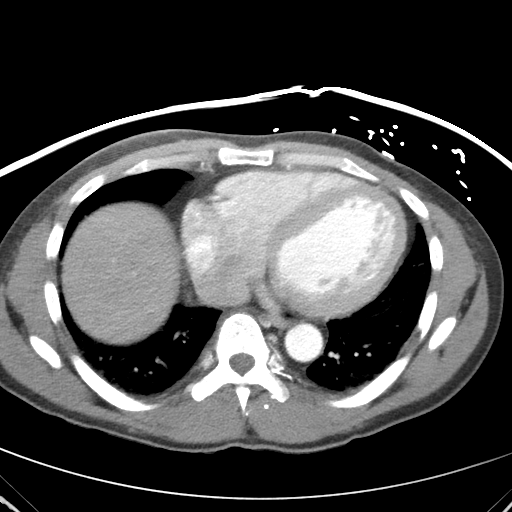

[mpr, coronals, coronal · coronal · 1.00mm/px · 3 of 82 slices shown]
[im 28/82  soft-tissue]
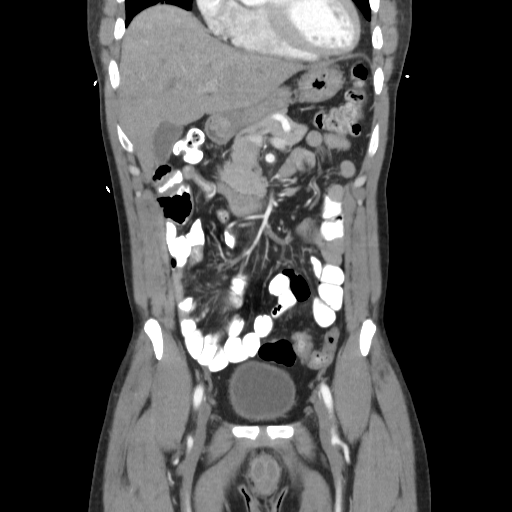
[im 37/82  soft-tissue]
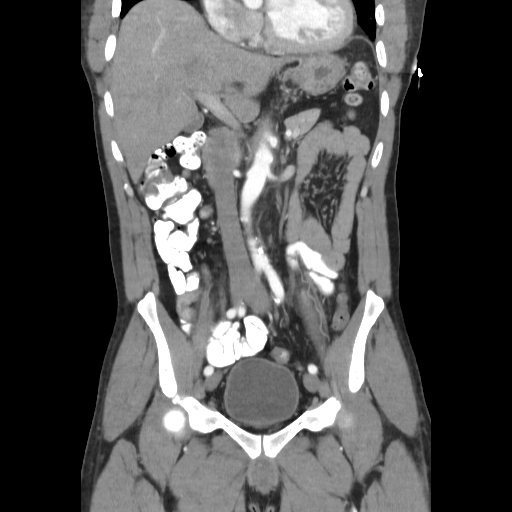
[im 46/82  soft-tissue]
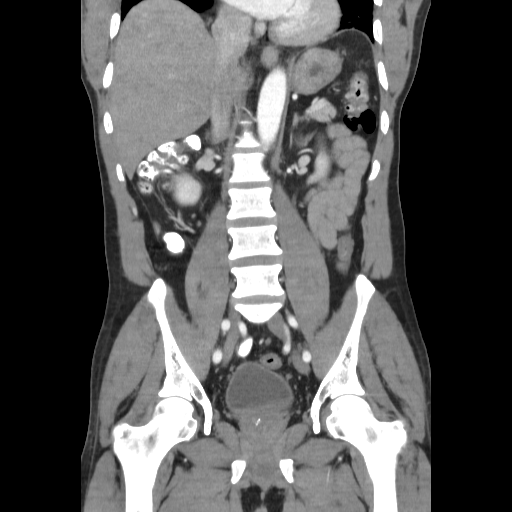

[17 of 46 positions shown; findings below may reference images not displayed]

FINDINGS: There is dependent atelectasis in the visualized lung
bases.

Gallbladder is nondistended.  Unremarkable liver, spleen, adrenal
glands, kidneys, pancreas.  Patchy aortoiliac calcifications.
Stomach and small bowel are nondistended.  Normal appendix.  The
colon is nondilated, unremarkable.  Urinary bladder incompletely
distended.  Moderate prostatic enlargement with central
calcification.  Bilateral pelvic phleboliths.  No ascites.  No
pelvic or retroperitoneal adenopathy.

There are mild hazy changes in the retroperitoneal fat just
anterior to the aorta below the SMA.  No mesenteric adenopathy.

Regional bones unremarkable.
IMPRESSION: 1.  Subtle periaortic/retroperitoneal inflammatory/edematous
change.  The findings are nonspecific and may be related to
regional infection or inflammation, pancreatitis, ulcer disease,
less commonly early retroperitoneal fibrosis, arteritis, or
lymphoma.
2.  Advanced aortoiliac plaque for age.

## 2012-11-27 ENCOUNTER — Emergency Department (HOSPITAL_COMMUNITY)
Admission: EM | Admit: 2012-11-27 | Discharge: 2012-11-27 | Disposition: A | Discharge status: Home / Self Care | Payer: Medicare Other | Attending: Emergency Medicine | Admitting: Emergency Medicine

## 2012-11-27 ENCOUNTER — Encounter (HOSPITAL_COMMUNITY): Payer: Self-pay | Admitting: *Deleted

## 2012-11-27 ENCOUNTER — Other Ambulatory Visit (HOSPITAL_COMMUNITY): Payer: Medicare Other

## 2012-11-27 ENCOUNTER — Emergency Department (HOSPITAL_COMMUNITY): Payer: Medicare Other

## 2012-11-27 DIAGNOSIS — J329 Chronic sinusitis, unspecified: Secondary | ICD-10-CM | POA: Diagnosis not present

## 2012-11-27 DIAGNOSIS — F411 Generalized anxiety disorder: Secondary | ICD-10-CM | POA: Insufficient documentation

## 2012-11-27 DIAGNOSIS — R1310 Dysphagia, unspecified: Secondary | ICD-10-CM | POA: Diagnosis not present

## 2012-11-27 DIAGNOSIS — R599 Enlarged lymph nodes, unspecified: Secondary | ICD-10-CM | POA: Insufficient documentation

## 2012-11-27 DIAGNOSIS — F17200 Nicotine dependence, unspecified, uncomplicated: Secondary | ICD-10-CM | POA: Insufficient documentation

## 2012-11-27 DIAGNOSIS — R519 Headache, unspecified: Secondary | ICD-10-CM

## 2012-11-27 DIAGNOSIS — R220 Localized swelling, mass and lump, head: Secondary | ICD-10-CM | POA: Diagnosis not present

## 2012-11-27 DIAGNOSIS — N189 Chronic kidney disease, unspecified: Secondary | ICD-10-CM | POA: Insufficient documentation

## 2012-11-27 DIAGNOSIS — I509 Heart failure, unspecified: Secondary | ICD-10-CM | POA: Diagnosis not present

## 2012-11-27 DIAGNOSIS — Z79899 Other long term (current) drug therapy: Secondary | ICD-10-CM | POA: Diagnosis not present

## 2012-11-27 DIAGNOSIS — M542 Cervicalgia: Secondary | ICD-10-CM | POA: Insufficient documentation

## 2012-11-27 DIAGNOSIS — I129 Hypertensive chronic kidney disease with stage 1 through stage 4 chronic kidney disease, or unspecified chronic kidney disease: Secondary | ICD-10-CM | POA: Insufficient documentation

## 2012-11-27 DIAGNOSIS — J019 Acute sinusitis, unspecified: Secondary | ICD-10-CM | POA: Diagnosis not present

## 2012-11-27 DIAGNOSIS — E785 Hyperlipidemia, unspecified: Secondary | ICD-10-CM | POA: Insufficient documentation

## 2012-11-27 DIAGNOSIS — K219 Gastro-esophageal reflux disease without esophagitis: Secondary | ICD-10-CM | POA: Insufficient documentation

## 2012-11-27 DIAGNOSIS — I10 Essential (primary) hypertension: Secondary | ICD-10-CM

## 2012-11-27 DIAGNOSIS — R51 Headache: Secondary | ICD-10-CM | POA: Diagnosis not present

## 2012-11-27 LAB — COMPREHENSIVE METABOLIC PANEL
BUN: 12 mg/dL (ref 6–23)
Calcium: 9.3 mg/dL (ref 8.4–10.5)
Creatinine, Ser: 1.53 mg/dL — ABNORMAL HIGH (ref 0.50–1.35)
GFR calc Af Amer: 64 mL/min — ABNORMAL LOW (ref >90)
Glucose, Bld: 76 mg/dL (ref 70–99)
Total Protein: 7.4 g/dL (ref 6.0–8.3)

## 2012-11-27 LAB — CBC
HCT: 40.6 % (ref 39.0–52.0)
Hemoglobin: 13.6 g/dL (ref 13.0–17.0)
MCH: 31.4 pg (ref 26.0–34.0)
MCHC: 33.5 g/dL (ref 30.0–36.0)
RDW: 14.3 % (ref 11.5–15.5)

## 2012-11-27 MED ORDER — IOHEXOL 300 MG/ML  SOLN
75.0000 mL | Freq: Once | INTRAMUSCULAR | Status: AC | PRN
  Administered 2012-11-27: 75 mL via INTRAVENOUS

## 2012-11-27 MED ORDER — ONDANSETRON HCL 4 MG/2ML IJ SOLN
4.0000 mg | Freq: Once | INTRAMUSCULAR | Status: AC
  Administered 2012-11-27: 4 mg via INTRAVENOUS
  Filled 2012-11-27: qty 2

## 2012-11-27 MED ORDER — HYDROMORPHONE HCL PF 1 MG/ML IJ SOLN
1.0000 mg | Freq: Once | INTRAMUSCULAR | Status: AC
  Administered 2012-11-27: 1 mg via INTRAVENOUS
  Filled 2012-11-27: qty 1

## 2012-11-27 MED ORDER — AZITHROMYCIN 250 MG PO TABS: ORAL_TABLET | ORAL | Status: DC

## 2012-11-27 MED ORDER — HYDROCODONE-ACETAMINOPHEN 5-325 MG PO TABS: 1.0000 | ORAL_TABLET | Freq: Four times a day (QID) | ORAL | Status: DC | PRN

## 2012-11-27 MED ORDER — SODIUM CHLORIDE 0.9 % IV SOLN
Freq: Once | INTRAVENOUS | Status: AC
  Administered 2012-11-27: 17:00:00 via INTRAVENOUS

## 2012-11-27 MED ORDER — MORPHINE SULFATE 4 MG/ML IJ SOLN
4.0000 mg | Freq: Once | INTRAMUSCULAR | Status: AC
  Administered 2012-11-27: 4 mg via INTRAVENOUS
  Filled 2012-11-27: qty 1
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
## 2012-11-27 NOTE — ED Notes (Signed)
Pt returned from radiology.

## 2012-11-27 NOTE — ED Provider Notes (Signed)
  Medical screening examination/treatment/procedure(s) were performed by non-physician practitioner and as supervising physician I was immediately available for consultation/collaboration.    Carmin Muskrat, MD 11/27/12 2026

## 2012-11-27 NOTE — ED Notes (Signed)
Pt has had increasing headache with painful lumb in lyphnode area under right jaw and difficulty hearing with this.  Pt denies any URI or congestion with this.  Pt feels that he may have run a temp with this (did not measure it).

## 2012-11-29 LAB — CULTURE, GROUP A STREP

## 2013-02-20 ENCOUNTER — Encounter (HOSPITAL_COMMUNITY): Payer: Self-pay | Admitting: Emergency Medicine

## 2013-02-20 ENCOUNTER — Emergency Department (HOSPITAL_COMMUNITY): Payer: Medicare Other

## 2013-02-20 ENCOUNTER — Inpatient Hospital Stay (HOSPITAL_COMMUNITY)
Admission: EM | Admit: 2013-02-20 | Discharge: 2013-02-22 | DRG: 313 | Discharge status: Against Medical Advice | Payer: Medicare Other | Attending: Internal Medicine | Admitting: Internal Medicine

## 2013-02-20 DIAGNOSIS — N189 Chronic kidney disease, unspecified: Secondary | ICD-10-CM

## 2013-02-20 DIAGNOSIS — I160 Hypertensive urgency: Secondary | ICD-10-CM

## 2013-02-20 DIAGNOSIS — N183 Chronic kidney disease, stage 3 unspecified: Secondary | ICD-10-CM | POA: Diagnosis present

## 2013-02-20 DIAGNOSIS — I5022 Chronic systolic (congestive) heart failure: Secondary | ICD-10-CM

## 2013-02-20 DIAGNOSIS — F17200 Nicotine dependence, unspecified, uncomplicated: Secondary | ICD-10-CM | POA: Diagnosis present

## 2013-02-20 DIAGNOSIS — I509 Heart failure, unspecified: Secondary | ICD-10-CM

## 2013-02-20 DIAGNOSIS — F411 Generalized anxiety disorder: Secondary | ICD-10-CM | POA: Diagnosis present

## 2013-02-20 DIAGNOSIS — K219 Gastro-esophageal reflux disease without esophagitis: Secondary | ICD-10-CM | POA: Diagnosis present

## 2013-02-20 DIAGNOSIS — E785 Hyperlipidemia, unspecified: Secondary | ICD-10-CM | POA: Diagnosis present

## 2013-02-20 DIAGNOSIS — R51 Headache: Secondary | ICD-10-CM

## 2013-02-20 DIAGNOSIS — I10 Essential (primary) hypertension: Secondary | ICD-10-CM | POA: Diagnosis not present

## 2013-02-20 DIAGNOSIS — I351 Nonrheumatic aortic (valve) insufficiency: Secondary | ICD-10-CM

## 2013-02-20 DIAGNOSIS — R079 Chest pain, unspecified: Secondary | ICD-10-CM | POA: Diagnosis not present

## 2013-02-20 DIAGNOSIS — Z9119 Patient's noncompliance with other medical treatment and regimen: Secondary | ICD-10-CM

## 2013-02-20 DIAGNOSIS — I426 Alcoholic cardiomyopathy: Secondary | ICD-10-CM | POA: Diagnosis present

## 2013-02-20 DIAGNOSIS — N179 Acute kidney failure, unspecified: Secondary | ICD-10-CM | POA: Diagnosis present

## 2013-02-20 DIAGNOSIS — I110 Hypertensive heart disease with heart failure: Secondary | ICD-10-CM | POA: Diagnosis not present

## 2013-02-20 DIAGNOSIS — I359 Nonrheumatic aortic valve disorder, unspecified: Secondary | ICD-10-CM | POA: Diagnosis present

## 2013-02-20 DIAGNOSIS — Z720 Tobacco use: Secondary | ICD-10-CM | POA: Diagnosis present

## 2013-02-20 DIAGNOSIS — R0602 Shortness of breath: Secondary | ICD-10-CM | POA: Diagnosis present

## 2013-02-20 DIAGNOSIS — I119 Hypertensive heart disease without heart failure: Secondary | ICD-10-CM | POA: Diagnosis not present

## 2013-02-20 DIAGNOSIS — I161 Hypertensive emergency: Secondary | ICD-10-CM

## 2013-02-20 DIAGNOSIS — F1010 Alcohol abuse, uncomplicated: Secondary | ICD-10-CM | POA: Diagnosis present

## 2013-02-20 DIAGNOSIS — R0789 Other chest pain: Principal | ICD-10-CM | POA: Diagnosis present

## 2013-02-20 DIAGNOSIS — I130 Hypertensive heart and chronic kidney disease with heart failure and stage 1 through stage 4 chronic kidney disease, or unspecified chronic kidney disease: Secondary | ICD-10-CM | POA: Diagnosis present

## 2013-02-20 DIAGNOSIS — Z91199 Patient's noncompliance with other medical treatment and regimen due to unspecified reason: Secondary | ICD-10-CM

## 2013-02-20 LAB — CBC WITH DIFFERENTIAL/PLATELET
Eosinophils Relative: 3 % (ref 0–5)
HCT: 42.3 % (ref 39.0–52.0)
Lymphocytes Relative: 34 % (ref 12–46)
Lymphs Abs: 1.6 10*3/uL (ref 0.7–4.0)
MCV: 93 fL (ref 78.0–100.0)
Monocytes Absolute: 0.7 10*3/uL (ref 0.1–1.0)
RBC: 4.55 MIL/uL (ref 4.22–5.81)
RDW: 14.3 % (ref 11.5–15.5)
WBC: 4.7 10*3/uL (ref 4.0–10.5)

## 2013-02-20 LAB — TROPONIN I: Troponin I: <0.3 ng/mL (ref <0.30)

## 2013-02-20 LAB — CREATININE, SERUM: Creatinine, Ser: 1.77 mg/dL — ABNORMAL HIGH (ref 0.50–1.35)

## 2013-02-20 LAB — CBC
MCH: 32 pg (ref 26.0–34.0)
MCHC: 34.5 g/dL (ref 30.0–36.0)
Platelets: 154 10*3/uL (ref 150–400)
RBC: 4.19 MIL/uL — ABNORMAL LOW (ref 4.22–5.81)
RDW: 14.3 % (ref 11.5–15.5)

## 2013-02-20 LAB — BASIC METABOLIC PANEL
BUN: 16 mg/dL (ref 6–23)
CO2: 26 mEq/L (ref 19–32)
Calcium: 9.7 mg/dL (ref 8.4–10.5)
Creatinine, Ser: 1.53 mg/dL — ABNORMAL HIGH (ref 0.50–1.35)
Glucose, Bld: 117 mg/dL — ABNORMAL HIGH (ref 70–99)

## 2013-02-20 LAB — MRSA PCR SCREENING: MRSA by PCR: NEGATIVE

## 2013-02-20 MED ORDER — ATORVASTATIN CALCIUM 40 MG PO TABS
40.0000 mg | ORAL_TABLET | Freq: Every day | ORAL | Status: DC
  Administered 2013-02-20 – 2013-02-21 (×2): 40 mg via ORAL
  Filled 2013-02-20 (×3): qty 1

## 2013-02-20 MED ORDER — SODIUM CHLORIDE 0.9 % IJ SOLN: 3.0000 mL | INTRAMUSCULAR | Status: DC | PRN

## 2013-02-20 MED ORDER — ONDANSETRON HCL 4 MG/2ML IJ SOLN: 4.0000 mg | Freq: Four times a day (QID) | INTRAMUSCULAR | Status: DC | PRN

## 2013-02-20 MED ORDER — ONDANSETRON HCL 4 MG/2ML IJ SOLN
4.0000 mg | Freq: Once | INTRAMUSCULAR | Status: AC
  Administered 2013-02-20: 4 mg via INTRAVENOUS
  Filled 2013-02-20: qty 2

## 2013-02-20 MED ORDER — NITROGLYCERIN IN D5W 200-5 MCG/ML-% IV SOLN
5.0000 ug/min | Freq: Once | INTRAVENOUS | Status: AC
  Administered 2013-02-20: 5 ug/min via INTRAVENOUS
  Filled 2013-02-20: qty 250

## 2013-02-20 MED ORDER — NITROGLYCERIN IN D5W 200-5 MCG/ML-% IV SOLN: 5.0000 ug/min | INTRAVENOUS | Status: DC

## 2013-02-20 MED ORDER — AMLODIPINE BESYLATE 5 MG PO TABS
5.0000 mg | ORAL_TABLET | Freq: Once | ORAL | Status: AC
  Administered 2013-02-20: 5 mg via ORAL
  Filled 2013-02-20: qty 1

## 2013-02-20 MED ORDER — SODIUM CHLORIDE 0.9 % IJ SOLN: 3.0000 mL | Freq: Two times a day (BID) | INTRAMUSCULAR | Status: DC

## 2013-02-20 MED ORDER — ONDANSETRON HCL 4 MG PO TABS: 4.0000 mg | ORAL_TABLET | Freq: Four times a day (QID) | ORAL | Status: DC | PRN

## 2013-02-20 MED ORDER — LABETALOL HCL 5 MG/ML IV SOLN
10.0000 mg | Freq: Once | INTRAVENOUS | Status: AC
  Administered 2013-02-20: 10 mg via INTRAVENOUS
  Filled 2013-02-20: qty 4

## 2013-02-20 MED ORDER — LISINOPRIL 10 MG PO TABS
10.0000 mg | ORAL_TABLET | Freq: Once | ORAL | Status: AC
  Administered 2013-02-20: 10 mg via ORAL
  Filled 2013-02-20 (×2): qty 1

## 2013-02-20 MED ORDER — CARVEDILOL 25 MG PO TABS
25.0000 mg | ORAL_TABLET | Freq: Two times a day (BID) | ORAL | Status: DC
  Administered 2013-02-20: 25 mg via ORAL
  Filled 2013-02-20 (×3): qty 1

## 2013-02-20 MED ORDER — ASPIRIN 325 MG PO TABS
325.0000 mg | ORAL_TABLET | Freq: Every day | ORAL | Status: DC
  Administered 2013-02-20 – 2013-02-22 (×3): 325 mg via ORAL
  Filled 2013-02-20 (×3): qty 1

## 2013-02-20 MED ORDER — ACETAMINOPHEN 325 MG PO TABS: 650.0000 mg | ORAL_TABLET | Freq: Four times a day (QID) | ORAL | Status: DC | PRN

## 2013-02-20 MED ORDER — ALUM & MAG HYDROXIDE-SIMETH 200-200-20 MG/5ML PO SUSP
30.0000 mL | Freq: Four times a day (QID) | ORAL | Status: DC | PRN
  Administered 2013-02-20: 30 mL via ORAL

## 2013-02-20 MED ORDER — MORPHINE SULFATE 4 MG/ML IJ SOLN
4.0000 mg | Freq: Once | INTRAMUSCULAR | Status: AC
  Administered 2013-02-20: 4 mg via INTRAVENOUS
  Filled 2013-02-20: qty 1

## 2013-02-20 MED ORDER — SODIUM CHLORIDE 0.9 % IV SOLN: 250.0000 mL | INTRAVENOUS | Status: DC | PRN

## 2013-02-20 MED ORDER — ACETAMINOPHEN 650 MG RE SUPP: 650.0000 mg | Freq: Four times a day (QID) | RECTAL | Status: DC | PRN

## 2013-02-20 MED ORDER — ENOXAPARIN SODIUM 40 MG/0.4ML ~~LOC~~ SOLN
40.0000 mg | SUBCUTANEOUS | Status: DC
  Administered 2013-02-20 – 2013-02-21 (×2): 40 mg via SUBCUTANEOUS
  Filled 2013-02-20 (×3): qty 0.4

## 2013-02-20 MED ORDER — HYDROCODONE-ACETAMINOPHEN 5-325 MG PO TABS
1.0000 | ORAL_TABLET | Freq: Four times a day (QID) | ORAL | Status: DC | PRN
  Administered 2013-02-22: 2 via ORAL
  Filled 2013-02-20 (×3): qty 2

## 2013-02-20 MED ORDER — SODIUM CHLORIDE 0.9 % IJ SOLN
3.0000 mL | Freq: Two times a day (BID) | INTRAMUSCULAR | Status: DC
  Administered 2013-02-20 – 2013-02-21 (×2): 3 mL via INTRAVENOUS

## 2013-02-20 MED ORDER — HYDRALAZINE HCL 25 MG PO TABS
25.0000 mg | ORAL_TABLET | Freq: Once | ORAL | Status: AC
  Administered 2013-02-20: 25 mg via ORAL
  Filled 2013-02-20: qty 1

## 2013-02-20 MED ORDER — MORPHINE SULFATE 2 MG/ML IJ SOLN
1.0000 mg | INTRAMUSCULAR | Status: DC | PRN
  Administered 2013-02-20 – 2013-02-22 (×6): 1 mg via INTRAVENOUS
  Filled 2013-02-20 (×6): qty 1

## 2013-02-20 NOTE — ED Notes (Signed)
Hospitalist at bedside 

## 2013-02-20 NOTE — ED Notes (Signed)
Assumed care with Bonnell Public, RN.

## 2013-02-20 NOTE — H&P (Signed)
[facility] History and Physical  [name] [id number] DOB: 08/06/1970 DOA: 02/20/2013  Referring physician:  PCP: [name], MD  Specialists:   Chief Complaint: CP/SHortness of breath  HPI: [name] is a 42 y.o. male with a past medical history of cardiomyopathy likely secondary to alcohol abuse, having her last transthoracic echocardiogram on 01/26/2012 which showed an ejection fraction of 45-50% with diffuse hypokinesis, history of medication nonadherence and hypertensive emergency, who presents to the emergency department with complaints of right arm pain and right-sided chest pain. He reports developing right arm and right-sided chest pain approximately one week ago, characterized as tight/pressure-like, non-radiating, worsened by physical exertion, and is associated with shortness of breath. Unfortunately he stopped taking his medications about a week ago as well, and presents emergency department today with a blood pressure of 216/128. He was started on nitroglycerin drip after which she reported improvement to his chest pain. Initial troponin was negative, EKG did not reveal ischemic changes. Chest x-ray did not show active cardiopulmonary changes. I discussed case with cardiology who will consult.                                                                                                  Review of Systems: The patient denies anorexia, fever, weight loss,, vision loss, decreased hearing, hoarseness, syncope, peripheral edema, balance deficits, hemoptysis, abdominal pain, melena, hematochezia, severe indigestion/heartburn, hematuria, incontinence, genital sores, muscle weakness, suspicious skin lesions, transient blindness, difficulty walking, depression, unusual weight change, abnormal bleeding, enlarged lymph nodes, angioedema, and breast masses.    Past Medical History  Diagnosis Date  . Hypertension     a. Hx of HTN urgency secondary to noncompliance. b.  urinary metanephrine and catecholeamine levels normal 2013.  [name] Anxiety   . GERD (gastroesophageal reflux disease)   . Acute renal failure     June 2012 felt secondary to Toradol  . Chronic renal failure   . CHF (congestive heart failure)     a. 05/2011: Adm with pulm edema/HTN urgency, EF 35-40% with diffuse hypokinesis and moderate to severe mitral regurgitation. Cardiomyopathy likely due to uncontrolled HTN and ETOH abuse - cath deferred due to renal insufficiency (felt due to uncontrolled HTN). bJodie Echevaria MV 06/2011: EF 37% and no ischemia or infarction. c. EF 45-50% by echo 01/2012.  . Cardiomyopathy   . HYPERLIPIDEMIA   . INGUINAL HERNIA   . ETOH abuse     a. Reported to have quit 05/2011.  . Valvular heart disease     a. Echo 05/2011: moderate to severe eccentric MR and mild to moderate AI with prolapsing left coronary cusp. b. Echo 01/2012: mild-mod AI, mild dilitation of aortic root, mild MR.  . CKD (chronic kidney disease)     a. 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  . Tobacco abuse    Past Surgical History  Procedure Laterality Date  . Hernia repair    . Ankle surgery      Fractures bilaterally   Social History:  reports that he has been smoking Cigarettes.  He has a 5 pack-year smoking history. He has never used  smokeless tobacco. He reports that he drinks alcohol. He reports that he uses illicit drugs (Marijuana).   Allergies  Allergen Reactions  . Imdur [Isosorbide]     headache    Family History  Problem Relation Age of Onset  . Hypertension    . Colon cancer Paternal Uncle      Prior to Admission medications   Medication Sig Start Date End Date Taking? Authorizing Provider  amLODipine (NORVASC) 10 MG tablet Take 10 mg by mouth daily.   Yes Historical Provider, MD  carvedilol (COREG) 25 MG tablet Take 25 mg by mouth daily.    Yes Historical Provider, MD  hydrALAZINE (APRESOLINE) 25 MG tablet Take 25 mg by mouth 3 (three) times daily.   Yes Historical Provider, MD   HYDROcodone-acetaminophen (NORCO) 5-325 MG per tablet Take 1-2 tablets by mouth every 6 (six) hours as needed for pain. 11/27/12  Yes Margarita Mail, PA-C  lisinopril (PRINIVIL,ZESTRIL) 20 MG tablet Take 20 mg by mouth daily.   Yes Historical Provider, MD   Physical Exam: Filed Vitals:   02/20/13 1332  BP: 175/125  Pulse:   Temp:   Resp:      General:  He is in no acute distress, reports improvement to his chest pain after the initiation of nitroglycerin IV.  Eyes: Pupils are equal round reactive to light extra and movement intact no sclera icterus hydrated oral mucosa  Neck: Supple symmetrical no jugular venous distention or carotid bruits  Cardiovascular: Regular rate and rhythm normal S1-S2 no murmurs rubs or gallops  Respiratory: Lung exam was clear, do not auscultate crackles, wheezing rhonchi or rales  Abdomen: Soft nontender nondistended positive bowel sounds in all 4 quadrants  Skin: No rashes or lesions noted  Musculoskeletal: Preserved range of motion to all extremities, no cyanosis clubbing or edema  Psychiatric: Patient is awake alert and oriented x3  Neurologic: Cranial nerves II through XII grossly intact no alteration to sensation global 5 of 5 muscle strength  Labs on Admission:  Basic Metabolic Panel:  Recent Labs Lab 02/20/13 1130  NA 139  K 4.4  CL 103  CO2 26  GLUCOSE 117*  BUN 16  CREATININE 1.53*  CALCIUM 9.7   Liver Function Tests: No results found for this basename: AST, ALT, ALKPHOS, BILITOT, PROT, ALBUMIN,  in the last 168 hours No results found for this basename: LIPASE, AMYLASE,  in the last 168 hours No results found for this basename: AMMONIA,  in the last 168 hours CBC:  Recent Labs Lab 02/20/13 1130  WBC 4.7  NEUTROABS 2.3  HGB 14.8  HCT 42.3  MCV 93.0  PLT 164   Cardiac Enzymes: No results found for this basename: CKTOTAL, CKMB, CKMBINDEX, TROPONINI,  in the last 168 hours  BNP (last 3 results)  Recent Labs   02/20/13 1130  PROBNP 795.2*   CBG:  Recent Labs Lab 02/20/13 1059  GLUCAP 113*    Radiological Exams on Admission: Dg Chest 2 View  02/20/2013   CLINICAL DATA:  Right chest pain, hypertension  EXAM: CHEST  2 VIEW  COMPARISON:  Prior chest x-ray and CT scan of the chest 09/19/2012  FINDINGS: Stable cardiac and mediastinal contours. Cardiomegaly with left ventricular prominence. Prominent ascending aortic shadow as well. The lungs are clear. No pleural effusion or pneumothorax. No acute osseous abnormality.  IMPRESSION: No active cardiopulmonary disease.  Stable cardiomegaly with left ventricular prominence and unchanged prominence of the ascending aortic contour.   Electronically Signed   By:  Jacqulynn Cadet M.D.   On: 02/20/2013 12:33    EKG: Independently reviewed. EKG showing sinus rhythm with inverted T waves in lateral leads  Assessment/Plan Active Problems:   Chest pain   HYPERLIPIDEMIA   SMOKER   HYPERTENSION   Cardiomegaly - hypertensive   Hypertension, malignant   CKD (chronic kidney disease)   Hypertensive urgency   Malignant hypertension   1.  Malignant hypertension. Unfortunately patient with history of medication nonadherence, stated not taking his antihypertensive agents for the past week. He was found to have elevated blood pressure of 216/128, started on a nitroglycerin drip. Will admit patient to the step down unit, continue nitroglycerin drip per protocol. I discussed case with cardiology who will consult. Plan to cycle troponin x3 sets. Await further recommendations from cardiology. 2. Chest pain. Patient complain of chest pain having typical and atypical features. Presenting with a malignant hypertension, initial set of cardiac enzymes are negative, BNP at 795. Had a clear lung exam. Per medical records have a stress test in 2013 which came back normal. Placing patient on nitroglycerin IV, starting aspirin, cycling troponin x3 sets.  3. Stage III chronic kidney  disease. His baseline creatinine fluctuating between 1.4 1.5, will monitor. 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  4. Cardiomyopathy. His last transthoracic echocardiogram performed on 01/26/2012 showing ejection fraction of 45-50% with diffuse hypokinesis. He does not appear to have clinical signs or symptoms of acute decompensated congestive heart failure. 5. Dyslipidemia. We'll check a statin, start Lipitor 40 mg by mouth daily 6. Tobacco abuse. Patient was given tobacco abuse cessation counseling 7. Diet. Heart healthy 8. DVT prophylaxis. Lovenox     Code Status: Full Code Disposition Plan: Will admit to Step Down continue Nitro gtt.   Time spent: 32  Kelvin Cellar [facility] Pager [phone]  If 7PM-7AM, please contact night-coverage [email or web address] Password [facility] 02/20/2013, 2:33 PM

## 2013-02-20 NOTE — ED Notes (Signed)
Pt is here with body aches, cough, chest pain, and headache.  Pt reports coughing up yellow sputum

## 2013-02-20 NOTE — ED Notes (Signed)
Dr. Coralyn Pear at bedside, made aware of blood pressure. Verbal order with read back from doctor Coralyn Pear, Nitro to be kept at 65mcg/min until patient on floor.

## 2013-02-20 NOTE — ED Provider Notes (Signed)
CSN: [id number]     Arrival date & time 02/20/13  1021 History   First MD Initiated Contact with Patient 02/20/13 1035     Chief Complaint  Patient presents with  . Chest Pain  . Headache  . Cough   (Consider location/radiation/quality/duration/timing/severity/associated sxs/prior Treatment) HPI Comments: Patient is a 42 year old male with a past medical history of CKD secondary to hypertension, CHF, cardiomyopathy, and alcohol and tobacco abuse who presents with an 8 day history of chest pain. Symptoms started gradually and have remained constant since the onset. The pain is located in his right chest and radiates down his right arm. The pain is aching and severe. No aggravating/alleviating factors. Patient reports associated SOB, headache. He reports not taking his BP medications in the past 8 days because he "gets sick" whenever he eats. Patient had an echocardiogram in March 2013 which showed an EF of 35-40%. Patient sees a PCP at [facility] and reports he has not followed up with his Cardiologist lately because he "has been doing well."   Past Medical History  Diagnosis Date  . Hypertension     a. Hx of HTN urgency secondary to noncompliance. b. urinary metanephrine and catecholeamine levels normal 2013.  [name] Anxiety   . GERD (gastroesophageal reflux disease)   . Acute renal failure     June 2012 felt secondary to Toradol  . Chronic renal failure   . CHF (congestive heart failure)     a. 05/2011: Adm with pulm edema/HTN urgency, EF 35-40% with diffuse hypokinesis and moderate to severe mitral regurgitation. Cardiomyopathy likely due to uncontrolled HTN and ETOH abuse - cath deferred due to renal insufficiency (felt due to uncontrolled HTN). bJodie Echevaria MV 06/2011: EF 37% and no ischemia or infarction. c. EF 45-50% by echo 01/2012.  . Cardiomyopathy   . HYPERLIPIDEMIA   . INGUINAL HERNIA   . ETOH abuse     a. Reported to have quit 05/2011.  . Valvular heart disease     a. Echo 05/2011:  moderate to severe eccentric MR and mild to moderate AI with prolapsing left coronary cusp. b. Echo 01/2012: mild-mod AI, mild dilitation of aortic root, mild MR.  . CKD (chronic kidney disease)     a. 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  . Tobacco abuse    Past Surgical History  Procedure Laterality Date  . Hernia repair    . Ankle surgery      Fractures bilaterally   Family History  Problem Relation Age of Onset  . Hypertension    . Colon cancer Paternal Uncle    History  Substance Use Topics  . Smoking status: Current Some Day Smoker -- 0.25 packs/day for 20 years    Types: Cigarettes  . Smokeless tobacco: Never Used  . Alcohol Use: Yes     Comment: used to drink 1 pint of gin a week, trying to stop    Review of Systems  Constitutional: Negative for fever, chills and fatigue.  HENT: Negative for trouble swallowing.   Eyes: Negative for visual disturbance.  Respiratory: Positive for shortness of breath.   Cardiovascular: Positive for chest pain. Negative for palpitations.  Gastrointestinal: Negative for nausea, vomiting, abdominal pain and diarrhea.  Genitourinary: Negative for dysuria and difficulty urinating.  Musculoskeletal: Positive for myalgias. Negative for arthralgias and neck pain.  Skin: Negative for color change.  Neurological: Positive for headaches. Negative for dizziness and weakness.  Psychiatric/Behavioral: Negative for dysphoric mood.    Allergies  Imdur  Home Medications  Current Outpatient Rx  Name  Route  Sig  Dispense  Refill  . albuterol (PROVENTIL HFA;VENTOLIN HFA) 108 (90 BASE) MCG/ACT inhaler   Inhalation   Inhale 2 puffs into the lungs every 6 (six) hours as needed. For shortness of breath         . amLODipine (NORVASC) 10 MG tablet   Oral   Take 10 mg by mouth daily.         . Aspirin-Salicylamide-Caffeine (BC HEADACHE POWDER PO)   Oral   Take 1 packet by mouth daily as needed (for pain).         [name] azithromycin (ZITHROMAX Z-PAK) 250  MG tablet      2 po day one, then 1 daily x 4 days   5 tablet   0   . carvedilol (COREG) 25 MG tablet   Oral   Take 25 mg by mouth 2 (two) times daily with a meal.         . furosemide (LASIX) 20 MG tablet   Oral   Take 20 mg by mouth daily.         . hydrALAZINE (APRESOLINE) 25 MG tablet   Oral   Take 25 mg by mouth 3 (three) times daily.         [name] HYDROcodone-acetaminophen (NORCO) 5-325 MG per tablet   Oral   Take 1-2 tablets by mouth every 6 (six) hours as needed for pain.   20 tablet   0   . lisinopril (PRINIVIL,ZESTRIL) 20 MG tablet   Oral   Take 20 mg by mouth daily.         . Menthol-Methyl Salicylate (MUSCLE RUB) 10-15 % CREA   Topical   Apply 1 application topically daily as needed (for muscle soreness).         . potassium chloride SA (K-DUR,KLOR-CON) 20 MEQ tablet   Oral   Take 20 mEq by mouth daily.          BP 217/137  Pulse 95  Temp(Src) 98.5 F (36.9 C) (Oral)  Resp 14  SpO2 100% Physical Exam  Nursing note and vitals reviewed. Constitutional: He is oriented to person, place, and time. He appears well-developed and well-nourished. No distress.  HENT:  Head: Normocephalic and atraumatic.  Eyes: Conjunctivae and EOM are normal.  Neck: Normal range of motion.  Cardiovascular: Normal rate, regular rhythm and intact distal pulses.  Exam reveals no gallop and no friction rub.   No murmur heard. Pulmonary/Chest: Effort normal and breath sounds normal. He has no wheezes. He has no rales. He exhibits no tenderness.  Abdominal: Soft. He exhibits no distension. There is no tenderness. There is no rebound and no guarding.  Musculoskeletal: Normal range of motion.  Neurological: He is alert and oriented to person, place, and time. Coordination normal.  Speech is goal-oriented. Moves limbs without ataxia.   Skin: Skin is warm and dry.  Psychiatric: He has a normal mood and affect. His behavior is normal.    ED Course  Procedures (including  critical care time)   Date: 02/20/2013  Rate: 92  Rhythm: normal sinus rhythm  QRS Axis: left  Intervals: normal  ST/T Wave abnormalities: nonspecific ST/T changes  Conduction Disutrbances:none  Narrative Interpretation: NSR with ST and T wave changes, which is unchanged from previous  Old EKG Reviewed: unchanged    Labs Review Labs Reviewed  CBC WITH DIFFERENTIAL - Abnormal; Notable for the following:    Monocytes Relative 14 (*)  All other components within normal limits  BASIC METABOLIC PANEL - Abnormal; Notable for the following:    Glucose, Bld 117 (*)    Creatinine, Ser 1.53 (*)    GFR calc non Af Amer 55 (*)    GFR calc Af Amer 64 (*)    All other components within normal limits  PRO B NATRIURETIC PEPTIDE - Abnormal; Notable for the following:    Pro B Natriuretic peptide (BNP) 795.2 (*)    All other components within normal limits  GLUCOSE, CAPILLARY - Abnormal; Notable for the following:    Glucose-Capillary 113 (*)    All other components within normal limits  POCT I-STAT TROPONIN I   Imaging Review Dg Chest 2 View  02/20/2013   CLINICAL DATA:  Right chest pain, hypertension  EXAM: CHEST  2 VIEW  COMPARISON:  Prior chest x-ray and CT scan of the chest 09/19/2012  FINDINGS: Stable cardiac and mediastinal contours. Cardiomegaly with left ventricular prominence. Prominent ascending aortic shadow as well. The lungs are clear. No pleural effusion or pneumothorax. No acute osseous abnormality.  IMPRESSION: No active cardiopulmonary disease.  Stable cardiomegaly with left ventricular prominence and unchanged prominence of the ascending aortic contour.   Electronically Signed   By: Jacqulynn Cadet M.D.   On: 02/20/2013 12:33    EKG Interpretation   None       MDM   1. Chest pain   2. Cardiomegaly - hypertensive   3. CHF (congestive heart failure)   4. CKD (chronic kidney disease)   5. Hypertension, malignant   6. Systolic CHF, chronic   7. Tobacco abuse      10:56 AM Labs and chest xray pending. Patient will have home BP meds. Blood pressure is high at 212/143. Remaining vitals unremarkable for acute changes. Patient will have morphine for pain.   1:51 PM Labs and chest xray unremarkable for acute changes. Patient will be admitted to Dr. Coralyn Pear for chest pain rule out due to symptoms and risk factors.    Alvina Chou, PA-C 02/20/13 1353

## 2013-02-20 NOTE — ED Notes (Signed)
CBG 113, notified RN

## 2013-02-20 NOTE — Consult Note (Signed)
CARDIOLOGY CONSULT NOTE   Patient ID: [name] MRN: [id number] DOB/AGE: 1970/10/04 42 y.o.  Admit date: 02/20/2013  Primary Physician   [name], MD Primary Cardiologist   DM Reason for Consultation  Chest pain  [id number] [name] is a 42 y.o. male with no history of CAD. He has a history of LVD, HTN, and CKD.   Admit March 2013 with HTN, chest pain and CM, EF 35%.  Hospitalized November 2013 for HTN urgency after being out of his meds for > 1 month.  EF 45-50%  He was seen in the ER July 2014 for left chest pain, consistent with MS pain. BP at that time 157/110.   ER visit September 2014 for HA, BP 170/104.   He has a severe HA daily for years.   For 5 weeks or so, he has been having problems with nausea, worse after eating and regurgitation. He has lost 15 lbs in the last 5 weeks or so. Also spitting up yellow phlegm.  2 days before Thanksgiving, he developed right forearm pain. It was continuous. When he got up in the night to go to the bathroom, the arm pain would keep him from getting back to sleep. He felt one of his BP meds was giving him the nocturia, so he quit taking them all (no diuretic among them). The arm pain spread up his arm to his shoulder and right chest. It has been continuous for 8 days. He has not tried any medications for the pain. It is severe all the time.    Past Medical History  Diagnosis Date  . Hypertension     a. Hx of HTN urgency secondary to noncompliance. b. urinary metanephrine and catecholeamine levels normal 2013.  [name] Anxiety   . GERD (gastroesophageal reflux disease)   . Acute renal failure     June 2012 felt secondary to Toradol  . Chronic renal failure   . CHF (congestive heart failure)     a. 05/2011: Adm with pulm edema/HTN urgency, EF 35-40% with diffuse hypokinesis and moderate to severe mitral regurgitation. Cardiomyopathy likely due to uncontrolled HTN and ETOH abuse - cath deferred due to renal insufficiency (felt due  to uncontrolled HTN). bJodie Ewing MV 06/2011: EF 37% and no ischemia or infarction. c. EF 45-50% by echo 01/2012.  . Cardiomyopathy   . HYPERLIPIDEMIA   . INGUINAL HERNIA   . ETOH abuse     a. Reported to have quit 05/2011.  . Valvular heart disease     a. Echo 05/2011: moderate to severe eccentric MR and mild to moderate AI with prolapsing left coronary cusp. b. Echo 01/2012: mild-mod AI, mild dilitation of aortic root, mild MR.  . CKD (chronic kidney disease)     a. 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  . Tobacco abuse      Past Surgical History  Procedure Laterality Date  . Hernia repair    . Ankle surgery      Fractures bilaterally    Allergies  Allergen Reactions  . Imdur [Isosorbide]     headache    I have reviewed the patient's current medications . carvedilol  25 mg Oral BID WC   Prior to Admission medications - has been out of meds x 8 days   Medication Sig Start Date End Date Taking? Authorizing Provider  amLODipine (NORVASC) 10 MG tablet Take 10 mg by mouth daily.   Yes Historical Provider, MD  carvedilol (COREG) 25 MG tablet Take 25 mg  by mouth daily.    Yes Historical Provider, MD  hydrALAZINE (APRESOLINE) 25 MG tablet Take 25 mg by mouth 3 (three) times daily.   Yes Historical Provider, MD  HYDROcodone-acetaminophen (NORCO) 5-325 MG per tablet Take 1-2 tablets by mouth every 6 (six) hours as needed for pain. 11/27/12  Yes Margarita Mail, PA-C  lisinopril (PRINIVIL,ZESTRIL) 20 MG tablet Take 20 mg by mouth daily.   Yes Historical Provider, MD     History   Social History  . Marital Status: Married    Spouse Name: N/A    Number of Children: 49  . Years of Education: N/A   Occupational History  . Disabled, part-time Programmer, systems     Disability   Social History Main Topics  . Smoking status: Current Some Day Smoker -- 0.25 packs/day for 20 years    Types: Cigarettes  . Smokeless tobacco: Never Used  . Alcohol Use: Yes     Comment: used to drink 1 pint of gin a  week, trying to stop  . Drug Use: Yes    Special: Marijuana     Comment: twice a month  . Sexual Activity: Yes   Other Topics Concern  . Not on file   Social History Narrative  . No narrative on file    No family status information on file.   Family History  Problem Relation Age of Onset  . Hypertension    . Colon cancer Paternal Uncle      ROS:  Full 14 point review of systems complete and found to be negative unless listed above.  Physical Exam: Blood pressure 155/99, pulse 78, temperature 98.1 F (36.7 C), temperature source Oral, resp. rate 16, SpO2 98.00%.  General: Well developed, well nourished, male in no acute distress Head: Eyes PERRLA, No xanthomas.   Normocephalic and atraumatic, oropharynx without edema or exudate. Dentition: poor Lungs: few rales, good air exchange Heart: HRRR S1 S2, no rub/gallop, systolic and diastolic murmurs. pulses are 2+ all 4 extrem.   Neck: No carotid bruits. No lymphadenopathy.  JVD not elevated. Abdomen: Bowel sounds present, abdomen soft and non-tender without masses or hernias noted. Msk:  No spine or cva tenderness. No weakness, no joint deformities or effusions. Extremities: No clubbing or cyanosis. No edema.  Neuro: Alert and oriented X 3. No focal deficits noted. Psych:  Anxious at times, responds appropriately Skin: No rashes or lesions noted.  Labs:   Lab Results  Component Value Date   WBC 4.7 02/20/2013   HGB 14.8 02/20/2013   HCT 42.3 02/20/2013   MCV 93.0 02/20/2013   PLT 164 02/20/2013   No results found for this basename: INR,  in the last 72 hours   Recent Labs Lab 02/20/13 1130  NA 139  K 4.4  CL 103  CO2 26  BUN 16  CREATININE 1.53*  CALCIUM 9.7  GLUCOSE 117*   Magnesium  Date Value Range Status  01/24/2012 1.6  1.5 - 2.5 mg/dL Final   No results found for this basename: CKTOTAL, CKMB, TROPONINI,  in the last 72 hours  Recent Labs  02/20/13 1138  TROPIPOC 0.00   Pro B Natriuretic peptide (BNP)   Date/Time Value Range Status  02/20/2013 11:30 AM 795.2* 0 - 125 pg/mL Final  01/24/2012  3:40 PM 2400.0* 0 - 125 pg/mL Final   Echo: 01/27/2012 Study Conclusions - Left ventricle: The cavity size was normal. Wall thickness was increased in a pattern of moderate LVH. Systolic function was mildly  reduced. The estimated ejection fraction was in the range of 45% to 50%. Diffuse hypokinesis. - Aortic valve: Mild to moderate regurgitation. - Aorta: Mild dilitation of aortic root. - Mitral valve: Mild thickening of the leaflets. Mild regurgitation. - Right ventricle: The cavity size was mildly dilated. - Right atrium: The atrium was mildly dilated. - Impressions: Since the prior study, LV motion looks a little better and there is decrease in the amount of MR. Impressions: - Since the prior study, LV motion looks a little better and there is decrease in the amount of MR.  ECG:  02/20/2013 SR, No new ischemic changes. LVH with strain was there in 2013 Vent. rate 92 BPM PR interval 152 ms QRS duration 96 ms QT/QTc 380/469 ms P-R-T axes 62 -44 101  Radiology:  Dg Chest 2 View 02/20/2013   CLINICAL DATA:  Right chest pain, hypertension  EXAM: CHEST  2 VIEW  COMPARISON:  Prior chest x-ray and CT scan of the chest 09/19/2012  FINDINGS: Stable cardiac and mediastinal contours. Cardiomegaly with left ventricular prominence. Prominent ascending aortic shadow as well. The lungs are clear. No pleural effusion or pneumothorax. No acute osseous abnormality.  IMPRESSION: No active cardiopulmonary disease.  Stable cardiomegaly with left ventricular prominence and unchanged prominence of the ascending aortic contour.   Electronically Signed   By: Jacqulynn Cadet M.D.   On: 02/20/2013 12:33    ASSESSMENT AND PLAN:   The patient was seen today by Dr. Ron Parker, the patient evaluated and the data reviewed.     Chest pain - Atypical symptoms and enzymes are negative despite > 1 week of pain. ECG no new  changes. Will check echo, continue to cycle enzymes. F/u on results.  Otherwise, per primary MD, consider UDS. Active Problems:   HYPERLIPIDEMIA   SMOKER   HYPERTENSION   Cardiomegaly - hypertensive   Hypertension, malignant   CKD (chronic kidney disease)   Hypertensive urgency   Malignant hypertension   Signed: Rosaria Ferries, PA-C 02/20/2013 3:15 PM Beeper [id number] Patient seen and examined. I agree with the assessment and plan as detailed above. See also my additional thoughts below.   I reviewed all the information carefully. It seems very unlikely the patient's chest and arm discomfort is cardiac in origin. It will be important to control his blood pressure. He stopped all of his medicines. If his enzymes are negative, I would recommend no further cardiac workup.  Dola Argyle, MD, [facility] 02/20/2013 4:28 PM

## 2013-02-21 DIAGNOSIS — R079 Chest pain, unspecified: Secondary | ICD-10-CM | POA: Diagnosis not present

## 2013-02-21 DIAGNOSIS — N189 Chronic kidney disease, unspecified: Secondary | ICD-10-CM | POA: Diagnosis not present

## 2013-02-21 DIAGNOSIS — R51 Headache: Secondary | ICD-10-CM | POA: Diagnosis not present

## 2013-02-21 DIAGNOSIS — I359 Nonrheumatic aortic valve disorder, unspecified: Secondary | ICD-10-CM

## 2013-02-21 DIAGNOSIS — I10 Essential (primary) hypertension: Secondary | ICD-10-CM | POA: Diagnosis not present

## 2013-02-21 DIAGNOSIS — I428 Other cardiomyopathies: Secondary | ICD-10-CM

## 2013-02-21 DIAGNOSIS — I119 Hypertensive heart disease without heart failure: Secondary | ICD-10-CM | POA: Diagnosis not present

## 2013-02-21 LAB — BASIC METABOLIC PANEL
CO2: 25 mEq/L (ref 19–32)
Chloride: 102 mEq/L (ref 96–112)
GFR calc Af Amer: 68 mL/min — ABNORMAL LOW (ref >90)
GFR calc non Af Amer: 59 mL/min — ABNORMAL LOW (ref >90)
Potassium: 3.7 mEq/L (ref 3.5–5.1)

## 2013-02-21 LAB — RAPID URINE DRUG SCREEN, HOSP PERFORMED
Barbiturates: NOT DETECTED
Cocaine: NOT DETECTED
Tetrahydrocannabinol: POSITIVE — AB

## 2013-02-21 LAB — LIPID PANEL
Cholesterol: 182 mg/dL (ref 0–200)
HDL: 55 mg/dL (ref >39)
LDL Cholesterol: 101 mg/dL — ABNORMAL HIGH (ref 0–99)
Triglycerides: 128 mg/dL (ref <150)
VLDL: 26 mg/dL (ref 0–40)

## 2013-02-21 LAB — CBC
HCT: 40.2 % (ref 39.0–52.0)
Hemoglobin: 13.7 g/dL (ref 13.0–17.0)
MCH: 31.8 pg (ref 26.0–34.0)
RDW: 14.1 % (ref 11.5–15.5)
WBC: 5.4 10*3/uL (ref 4.0–10.5)

## 2013-02-21 LAB — TSH: TSH: 1.561 u[IU]/mL (ref 0.350–4.500)

## 2013-02-21 LAB — TROPONIN I: Troponin I: <0.3 ng/mL (ref <0.30)

## 2013-02-21 MED ORDER — AMLODIPINE BESYLATE 10 MG PO TABS
10.0000 mg | ORAL_TABLET | Freq: Every day | ORAL | Status: DC
  Administered 2013-02-21 – 2013-02-22 (×2): 10 mg via ORAL
  Filled 2013-02-21 (×2): qty 1

## 2013-02-21 MED ORDER — HYDRALAZINE HCL 50 MG PO TABS
50.0000 mg | ORAL_TABLET | Freq: Three times a day (TID) | ORAL | Status: DC
  Administered 2013-02-21 – 2013-02-22 (×4): 50 mg via ORAL
  Filled 2013-02-21 (×7): qty 1

## 2013-02-21 MED ORDER — CARVEDILOL 12.5 MG PO TABS
12.5000 mg | ORAL_TABLET | Freq: Two times a day (BID) | ORAL | Status: DC
  Administered 2013-02-21 – 2013-02-22 (×3): 12.5 mg via ORAL
  Filled 2013-02-21 (×5): qty 1

## 2013-02-21 MED ORDER — LISINOPRIL 10 MG PO TABS
10.0000 mg | ORAL_TABLET | Freq: Every day | ORAL | Status: DC
  Administered 2013-02-21 – 2013-02-22 (×2): 10 mg via ORAL
  Filled 2013-02-21 (×2): qty 1

## 2013-02-21 NOTE — Progress Notes (Signed)
[facility] Progress Note [name] - Stepdown/ICU TEAM   KINGMICHAEL Ewing [id number] DOB: 02/27/1971 DOA: 02/20/2013 PCP: [name], MD  Brief narrative: [name] is a 42 y.o. male with a past medical history of cardiomyopathy likely secondary to alcohol abuse, TTE 01/26/2012  showed an ejection fraction of 45-50% with diffuse hypokinesis. Also has history of medication nonadherence and hypertensive emergency.  Presented to the emergency department with complaints of right arm pain and right-sided chest pain beginning approximately one week ago, characterized as tight/pressure-like, non-radiating, worsened by physical exertion, and associated with shortness of breath. Unfortunately he stopped taking his medications about a week ago as well, and presented to emergency department  with a blood pressure of 216/128. He was started on nitroglycerin drip after which she reported improvement to his chest pain. Initial troponin was negative and EKG did not reveal ischemic changes.    Assessment/Plan: Principal Problem:   Hypertension, malignant - titrated off of Nitro infusion  - currently receiving Lisinopril, Coreg, Hydralazine and Norvasc  Active Problems:  Systolic CHF - stable and suspected to be due to uncontrolled BP - ECHO being repeated this admission    CKD (chronic kidney disease) - stage 3    Chest pain - non-cardiac- resolved  HLP Cont Stain    SMOKER Advised to quit  Headache - He states he takes percocets as outpt when he has pain - Tylenol and Motrin does not help him   Code Status: full code  Family Communication: none Disposition Plan: transfer to tele floor  Consultants: Cardiology  Procedures: none  Antibiotics: none  DVT prophylaxis: Lovenox  HPI/Subjective: C/o moderate headache. No visual symptoms. No chest, arm pain or dyspnea,.    Objective: Blood pressure 142/93, pulse 63, temperature 98.2 F (36.8 C), temperature  source Oral, resp. rate 13, height 6' (Ewing.829 m), weight 71 kg (156 lb 8.4 oz), SpO2 97.00%.  Intake/Output Summary (Last 24 hours) at 02/21/13 1242 Last data filed at 02/21/13 0429  Gross per 24 hour  Intake    443 ml  Output    350 ml  Net     93 ml     Exam: General: No acute respiratory distress Lungs: Clear to auscultation bilaterally without wheezes or crackles Cardiovascular: Regular rate and rhythm without murmur gallop or rub normal S1 and S2 Abdomen: Nontender, nondistended, soft, bowel sounds positive, no rebound, no ascites, no appreciable mass Extremities: No significant cyanosis, clubbing, or edema bilateral lower extremities  Data Reviewed: Basic Metabolic Panel:  Recent Labs Lab 02/20/13 1130 02/20/13 1650 02/21/13 0340  NA 139  --  136  K 4.4  --  3.7  CL 103  --  102  CO2 26  --  25  GLUCOSE 117*  --  91  BUN 16  --  19  CREATININE Ewing.53* Ewing.77* Ewing.46*  CALCIUM 9.7  --  8.6   Liver Function Tests: No results found for this basename: AST, ALT, ALKPHOS, BILITOT, PROT, ALBUMIN,  in the last 168 hours No results found for this basename: LIPASE, AMYLASE,  in the last 168 hours No results found for this basename: AMMONIA,  in the last 168 hours CBC:  Recent Labs Lab 02/20/13 1130 02/20/13 1650 02/21/13 0340  WBC 4.7 5.6 5.4  NEUTROABS 2.3  --   --   HGB 14.8 13.4 13.7  HCT 42.3 38.8* 40.2  MCV 93.0 92.6 93.3  PLT 164 154 156   Cardiac Enzymes:  Recent Labs Lab  02/20/13 1650 02/20/13 2133 02/21/13 0340  TROPONINI <0.30 <0.30 <0.30   BNP (last 3 results)  Recent Labs  02/20/13 1130  PROBNP 795.2*   CBG:  Recent Labs Lab 02/20/13 1059  GLUCAP 113*    Recent Results (from the past 240 hour(s))  MRSA PCR SCREENING     Status: None   Collection Time    02/20/13  4:00 PM      Result Value Range Status   MRSA by PCR NEGATIVE  NEGATIVE Final   Comment:            The GeneXpert MRSA Assay (FDA     approved for NASAL specimens      only), is one component of a     comprehensive MRSA colonization     surveillance program. It is not     intended to diagnose MRSA     infection nor to guide or     monitor treatment for     MRSA infections.     Studies:  Recent x-ray studies have been reviewed in detail by the Attending Physician  Scheduled Meds:  Scheduled Meds: . amLODipine  10 mg Oral Daily  . aspirin  325 mg Oral Daily  . atorvastatin  40 mg Oral q1800  . carvedilol  12.5 mg Oral BID WC  . enoxaparin (LOVENOX) injection  40 mg Subcutaneous Q24H  . hydrALAZINE  50 mg Oral Q8H  . lisinopril  10 mg Oral Daily  . sodium chloride  3 mL Intravenous Q12H  . sodium chloride  3 mL Intravenous Q12H   Continuous Infusions:   Time spent on care of this patient: 35 min   [place], MD  [facility] Office  [phone] Pager - Text Page per Shea Evans as per below:  On-Call/Text Page:      [email or web address]      password [email or web address]  If 7PM-7AM, please contact night-coverage [email or web address] Password [email or web address] 02/21/2013, 12:42 PM   LOS: Ewing day

## 2013-02-21 NOTE — Progress Notes (Signed)
Patient ID: [name], male   DOB: 02-20-1971, 42 y.o.   MRN: [id number]   SUBJECTIVE: Patient reports chronic daily headache, does not seem to matter whether BP is high or in normal range.  He continues to have right arm and right-sided chest pain.  BP remains high.   [name] amLODipine  10 mg Oral Daily  . aspirin  325 mg Oral Daily  . atorvastatin  40 mg Oral q1800  . carvedilol  12.5 mg Oral BID WC  . enoxaparin (LOVENOX) injection  40 mg Subcutaneous Q24H  . hydrALAZINE  50 mg Oral Q8H  . lisinopril  10 mg Oral Daily  . sodium chloride  3 mL Intravenous Q12H  . sodium chloride  3 mL Intravenous Q12H      Filed Vitals:   02/20/13 2010 02/20/13 2100 02/21/13 0018 02/21/13 0500  BP: 156/96 156/98 130/85 148/94  Pulse: 66 63 76 69  Temp: 97.8 F (36.6 C)  98.1 F (36.7 C) 98 F (36.7 C)  TempSrc: Oral  Oral Oral  Resp: 17 15 14    Height:      Weight:    71 kg (156 lb 8.4 oz)  SpO2: 98% 99% 98% 97%    Intake/Output Summary (Last 24 hours) at 02/21/13 0754 Last data filed at 02/21/13 0429  Gross per 24 hour  Intake    443 ml  Output    350 ml  Net     93 ml    LABS: Basic Metabolic Panel:  Recent Labs  02/20/13 1130 02/20/13 1650 02/21/13 0340  NA 139  --  136  K 4.4  --  3.7  CL 103  --  102  CO2 26  --  25  GLUCOSE 117*  --  91  BUN 16  --  19  CREATININE 1.53* 1.77* 1.46*  CALCIUM 9.7  --  8.6   Liver Function Tests: No results found for this basename: AST, ALT, ALKPHOS, BILITOT, PROT, ALBUMIN,  in the last 72 hours No results found for this basename: LIPASE, AMYLASE,  in the last 72 hours CBC:  Recent Labs  02/20/13 1130 02/20/13 1650 02/21/13 0340  WBC 4.7 5.6 5.4  NEUTROABS 2.3  --   --   HGB 14.8 13.4 13.7  HCT 42.3 38.8* 40.2  MCV 93.0 92.6 93.3  PLT 164 154 156   Cardiac Enzymes:  Recent Labs  02/20/13 1650 02/20/13 2133 02/21/13 0340  TROPONINI <0.30 <0.30 <0.30   BNP: No components found with this basename: POCBNP,   D-Dimer: No results found for this basename: DDIMER,  in the last 72 hours Hemoglobin A1C: No results found for this basename: HGBA1C,  in the last 72 hours Fasting Lipid Panel:  Recent Labs  02/21/13 0340  CHOL 182  HDL 55  LDLCALC 101*  TRIG 128  CHOLHDL 3.3   Thyroid Function Tests:  Recent Labs  02/20/13 1650  TSH 1.561   Anemia Panel: No results found for this basename: VITAMINB12, FOLATE, FERRITIN, TIBC, IRON, RETICCTPCT,  in the last 72 hours  RADIOLOGY: Dg Chest 2 View  02/20/2013   CLINICAL DATA:  Right chest pain, hypertension  EXAM: CHEST  2 VIEW  COMPARISON:  Prior chest x-ray and CT scan of the chest 09/19/2012  FINDINGS: Stable cardiac and mediastinal contours. Cardiomegaly with left ventricular prominence. Prominent ascending aortic shadow as well. The lungs are clear. No pleural effusion or pneumothorax. No acute osseous abnormality.  IMPRESSION: No active cardiopulmonary disease.  Stable cardiomegaly with left ventricular prominence and unchanged prominence of the ascending aortic contour.   Electronically Signed   By: Jacqulynn Cadet M.D.   On: 02/20/2013 12:33    PHYSICAL EXAM General: NAD Neck: No JVD, no thyromegaly or thyroid nodule.  Lungs: Clear to auscultation bilaterally with normal respiratory effort. CV: Nondisplaced PMI.  Heart regular S1/S2, +S4, 1/6 SEM.  No peripheral edema.   Abdomen: Soft, nontender, no hepatosplenomegaly, no distention.  Neurologic: Alert and oriented x 3.  Psych: Normal affect. Extremities: No clubbing or cyanosis.   TELEMETRY: Reviewed telemetry pt in NSR  ASSESSMENT AND PLAN: 42 yo with history of resistant HTN, CKD, cardiomyopathy, and medication noncompliance presented with headache and right-sided chest pain.  He was found to have hypertensive urgency.  1. HTN: Difficult to control and hampered by compliance issues.  Complicated by CKD.   - Restart lisinopril 10 mg daily, Coreg 12.5 mg bid, amlodipine 10 mg  daily, and hydralazine 50 mg tid.  Titrate meds as needed.  2. CKD: Discussed with patient risk of progressing to ESRD if he is not compliant with meds.  3. Cardiomyopathy: Thought to be related to HTN.  EF 45-50% on echo in 2013 with mild to moderate AI.  Will repeat echo this admission.  4. Headache: This has been a chronic daily problem for him.  I do not think it is triggered by HTN (has headache most days even when BP is controlled).  I think that he ought to be evaluated by neurology because this problem probably drives his BP up and impairs his medication compliance.  ? Migraines.  5. Chest pain: Negative troponin.  Suspect noncardiac.   [name] 02/21/2013 7:59 AM

## 2013-02-21 NOTE — ED Provider Notes (Signed)
Medical screening examination/treatment/procedure(s) were conducted as a shared visit with non-physician practitioner(s) and myself.  I personally evaluated the patient during the encounter.   I agree with resident EKG interpretation.    Admit for CHF to Dr. Coralyn Pear.  No issues in ER.    Elmer Sow, MD 02/21/13 2203

## 2013-02-21 NOTE — Progress Notes (Signed)
Utilization Review Completed.  

## 2013-02-21 NOTE — Progress Notes (Signed)
  Echocardiogram 2D Echocardiogram has been performed.  Donata Clay 02/21/2013, 1:10 PM

## 2013-02-22 DIAGNOSIS — I428 Other cardiomyopathies: Secondary | ICD-10-CM | POA: Diagnosis not present

## 2013-02-22 DIAGNOSIS — I119 Hypertensive heart disease without heart failure: Secondary | ICD-10-CM | POA: Diagnosis not present

## 2013-02-22 DIAGNOSIS — I10 Essential (primary) hypertension: Secondary | ICD-10-CM | POA: Diagnosis not present

## 2013-02-22 DIAGNOSIS — R079 Chest pain, unspecified: Secondary | ICD-10-CM | POA: Diagnosis not present

## 2013-02-22 LAB — BASIC METABOLIC PANEL
BUN: 17 mg/dL (ref 6–23)
CO2: 23 mEq/L (ref 19–32)
Calcium: 8.7 mg/dL (ref 8.4–10.5)
Chloride: 104 mEq/L (ref 96–112)
Creatinine, Ser: 1.49 mg/dL — ABNORMAL HIGH (ref 0.50–1.35)

## 2013-02-22 MED ORDER — CARVEDILOL 25 MG PO TABS
25.0000 mg | ORAL_TABLET | Freq: Two times a day (BID) | ORAL | Status: DC
  Filled 2013-02-22 (×2): qty 1

## 2013-02-22 NOTE — Progress Notes (Signed)
Pt frustrated that, "I don't know wha'ts going on with me!" Despite the fact that this RN advised pt at 2000 on 12/9 that he is awaiting the results of his Echo and has been informed what labs have been checked throughout stay. At which point pt reported, "Thanks that's all I wanted to know." Twice during the night pt called out for pain medication for a headache. IV morphine and po Hydrocodone were provided, see MAR. Pt was sleeping soundly at each pain recheck. Pt states, "I'm leaving in the morning. I don't know what's going on and you aren't treating my pain. Why haven't you at least done a head CT to see why I have a headache, like my cousin had when he had a headache?" Pt reassured and advised MD will be rounding in am and giving results of Echo. NP Schorr advised of patients dissatisfaction as well as desire to leave. Pt A/O x 4 at time of discussion. Risks of leaving explained to pt. Pt encouraged to stay for completion of treatment.

## 2013-02-22 NOTE — Progress Notes (Addendum)
Patient ID: [name], male   DOB: 06/16/1970, 42 y.o.   MRN: [id number]    SUBJECTIVE: Patient reports chronic daily headache, does not seem to matter whether BP is high or in normal range.  Pain is severe this morning.  He says he is not going to take his BP medications if he has this headache.  BP is better.    [name] amLODipine  10 mg Oral Daily  . aspirin  325 mg Oral Daily  . atorvastatin  40 mg Oral q1800  . carvedilol  25 mg Oral BID WC  . enoxaparin (LOVENOX) injection  40 mg Subcutaneous Q24H  . hydrALAZINE  50 mg Oral Q8H  . lisinopril  10 mg Oral Daily  . sodium chloride  3 mL Intravenous Q12H      Filed Vitals:   02/22/13 0200 02/22/13 0400 02/22/13 0600 02/22/13 0603  BP: 126/95 142/105 132/98 132/98  Pulse: 69 79 68 69  Temp:      TempSrc:      Resp: 17 22    Height:      Weight:      SpO2: 99% 100% 99%     Intake/Output Summary (Last 24 hours) at 02/22/13 0748 Last data filed at 02/22/13 0600  Gross per 24 hour  Intake    360 ml  Output   1600 ml  Net  -1240 ml    LABS: Basic Metabolic Panel:  Recent Labs  02/21/13 0340 02/22/13 0439  NA 136 134*  K 3.7 3.9  CL 102 104  CO2 25 23  GLUCOSE 91 84  BUN 19 17  CREATININE 1.46* 1.49*  CALCIUM 8.6 8.7   Liver Function Tests: No results found for this basename: AST, ALT, ALKPHOS, BILITOT, PROT, ALBUMIN,  in the last 72 hours No results found for this basename: LIPASE, AMYLASE,  in the last 72 hours CBC:  Recent Labs  02/20/13 1130 02/20/13 1650 02/21/13 0340  WBC 4.7 5.6 5.4  NEUTROABS 2.3  --   --   HGB 14.8 13.4 13.7  HCT 42.3 38.8* 40.2  MCV 93.0 92.6 93.3  PLT 164 154 156   Cardiac Enzymes:  Recent Labs  02/20/13 1650 02/20/13 2133 02/21/13 0340  TROPONINI <0.30 <0.30 <0.30   BNP: No components found with this basename: POCBNP,  D-Dimer: No results found for this basename: DDIMER,  in the last 72 hours Hemoglobin A1C: No results found for this basename: HGBA1C,  in the last  72 hours Fasting Lipid Panel:  Recent Labs  02/21/13 0340  CHOL 182  HDL 55  LDLCALC 101*  TRIG 128  CHOLHDL 3.3   Thyroid Function Tests:  Recent Labs  02/20/13 1650  TSH 1.561   Anemia Panel: No results found for this basename: VITAMINB12, FOLATE, FERRITIN, TIBC, IRON, RETICCTPCT,  in the last 72 hours  RADIOLOGY: Dg Chest 2 View  02/20/2013   CLINICAL DATA:  Right chest pain, hypertension  EXAM: CHEST  2 VIEW  COMPARISON:  Prior chest x-ray and CT scan of the chest 09/19/2012  FINDINGS: Stable cardiac and mediastinal contours. Cardiomegaly with left ventricular prominence. Prominent ascending aortic shadow as well. The lungs are clear. No pleural effusion or pneumothorax. No acute osseous abnormality.  IMPRESSION: No active cardiopulmonary disease.  Stable cardiomegaly with left ventricular prominence and unchanged prominence of the ascending aortic contour.   Electronically Signed   By: Jacqulynn Cadet M.D.   On: 02/20/2013 12:33    PHYSICAL EXAM General: NAD  Neck: No JVD, no thyromegaly or thyroid nodule.  Lungs: Clear to auscultation bilaterally with normal respiratory effort. CV: Nondisplaced PMI.  Heart regular S1/S2, +S4, 1/6 SEM.  No peripheral edema.   Abdomen: Soft, nontender, no hepatosplenomegaly, no distention.  Neurologic: Alert and oriented x 3.  Psych: Normal affect. Extremities: No clubbing or cyanosis.   TELEMETRY: Reviewed telemetry pt in NSR  ASSESSMENT AND PLAN: 42 yo with history of resistant HTN, CKD, cardiomyopathy, and medication noncompliance presented with headache and right-sided chest pain.  He was found to have hypertensive urgency.  1. HTN: Difficult to control and hampered by compliance issues.  Complicated by CKD.  BP is better today.  Will increase Coreg back to 25 mg bid which appears to have been his home dose.  2. CKD: Discussed with patient risk of progressing to ESRD if he is not compliant with meds.  3. Cardiomyopathy: Thought to  be related to HTN.  EF 45-50% on echo yesterday (which is stable).  4. Headache: This has been a chronic daily problem for him.  I do not think it is triggered by HTN (has headache most days even when BP is controlled).  It is severe.  He says he will not take his BP meds at home because they make the headache worse.  I think that he ought to be evaluated by neurology while in the hospital because this problem probably drives his BP up and impairs his medication compliance (I will call).  ? Migraines.  5. Chest pain: Negative troponin.  Suspect noncardiac.  6. Aortic insufficiency: Moderate, suspect worsened by elevated BP.  Need to control BP.   No further cardiac workup, needs to continue BP meds.  Call with further questions.   [name] 02/22/2013 7:48 AM

## 2013-03-04 ENCOUNTER — Emergency Department (HOSPITAL_COMMUNITY): Payer: Medicare Other

## 2013-03-04 ENCOUNTER — Emergency Department (HOSPITAL_COMMUNITY)
Admission: EM | Admit: 2013-03-04 | Discharge: 2013-03-04 | Disposition: A | Discharge status: Home / Self Care | Payer: Medicare Other | Attending: Emergency Medicine | Admitting: Emergency Medicine

## 2013-03-04 ENCOUNTER — Encounter (HOSPITAL_COMMUNITY): Payer: Self-pay | Admitting: Emergency Medicine

## 2013-03-04 DIAGNOSIS — D696 Thrombocytopenia, unspecified: Secondary | ICD-10-CM | POA: Diagnosis not present

## 2013-03-04 DIAGNOSIS — N189 Chronic kidney disease, unspecified: Secondary | ICD-10-CM | POA: Insufficient documentation

## 2013-03-04 DIAGNOSIS — Z862 Personal history of diseases of the blood and blood-forming organs and certain disorders involving the immune mechanism: Secondary | ICD-10-CM | POA: Insufficient documentation

## 2013-03-04 DIAGNOSIS — R1110 Vomiting, unspecified: Secondary | ICD-10-CM | POA: Diagnosis not present

## 2013-03-04 DIAGNOSIS — Z8719 Personal history of other diseases of the digestive system: Secondary | ICD-10-CM | POA: Insufficient documentation

## 2013-03-04 DIAGNOSIS — R059 Cough, unspecified: Secondary | ICD-10-CM | POA: Diagnosis not present

## 2013-03-04 DIAGNOSIS — D72828 Other elevated white blood cell count: Secondary | ICD-10-CM | POA: Diagnosis not present

## 2013-03-04 DIAGNOSIS — J029 Acute pharyngitis, unspecified: Secondary | ICD-10-CM | POA: Insufficient documentation

## 2013-03-04 DIAGNOSIS — F17200 Nicotine dependence, unspecified, uncomplicated: Secondary | ICD-10-CM | POA: Insufficient documentation

## 2013-03-04 DIAGNOSIS — R6889 Other general symptoms and signs: Secondary | ICD-10-CM

## 2013-03-04 DIAGNOSIS — I509 Heart failure, unspecified: Secondary | ICD-10-CM | POA: Insufficient documentation

## 2013-03-04 DIAGNOSIS — I10 Essential (primary) hypertension: Secondary | ICD-10-CM | POA: Diagnosis not present

## 2013-03-04 DIAGNOSIS — J111 Influenza due to unidentified influenza virus with other respiratory manifestations: Secondary | ICD-10-CM | POA: Insufficient documentation

## 2013-03-04 DIAGNOSIS — Z8659 Personal history of other mental and behavioral disorders: Secondary | ICD-10-CM | POA: Insufficient documentation

## 2013-03-04 DIAGNOSIS — R0602 Shortness of breath: Secondary | ICD-10-CM | POA: Diagnosis not present

## 2013-03-04 DIAGNOSIS — D729 Disorder of white blood cells, unspecified: Secondary | ICD-10-CM

## 2013-03-04 DIAGNOSIS — D7289 Other specified disorders of white blood cells: Secondary | ICD-10-CM | POA: Diagnosis not present

## 2013-03-04 DIAGNOSIS — R197 Diarrhea, unspecified: Secondary | ICD-10-CM | POA: Insufficient documentation

## 2013-03-04 DIAGNOSIS — Z79899 Other long term (current) drug therapy: Secondary | ICD-10-CM | POA: Diagnosis not present

## 2013-03-04 DIAGNOSIS — I129 Hypertensive chronic kidney disease with stage 1 through stage 4 chronic kidney disease, or unspecified chronic kidney disease: Secondary | ICD-10-CM | POA: Insufficient documentation

## 2013-03-04 DIAGNOSIS — R05 Cough: Secondary | ICD-10-CM | POA: Diagnosis not present

## 2013-03-04 DIAGNOSIS — Z8639 Personal history of other endocrine, nutritional and metabolic disease: Secondary | ICD-10-CM | POA: Insufficient documentation

## 2013-03-04 LAB — CBC
MCH: 31.7 pg (ref 26.0–34.0)
MCHC: 34 g/dL (ref 30.0–36.0)
Platelets: 97 10*3/uL — ABNORMAL LOW (ref 150–400)
RDW: 14.1 % (ref 11.5–15.5)

## 2013-03-04 LAB — DIFFERENTIAL
Basophils Absolute: 0 10*3/uL (ref 0.0–0.1)
Basophils Relative: 1 % (ref 0–1)
Eosinophils Absolute: 0 10*3/uL (ref 0.0–0.7)
Eosinophils Relative: 0 % (ref 0–5)
Monocytes Relative: 21 % — ABNORMAL HIGH (ref 3–12)
Neutro Abs: 1 10*3/uL — ABNORMAL LOW (ref 1.7–7.7)
Neutrophils Relative %: 34 % — ABNORMAL LOW (ref 43–77)

## 2013-03-04 LAB — POCT I-STAT, CHEM 8
BUN: 14 mg/dL (ref 6–23)
Calcium, Ion: 1.11 mmol/L — ABNORMAL LOW (ref 1.12–1.23)
Creatinine, Ser: 2 mg/dL — ABNORMAL HIGH (ref 0.50–1.35)
Hemoglobin: 13.9 g/dL (ref 13.0–17.0)
Potassium: 3.4 mEq/L — ABNORMAL LOW (ref 3.5–5.1)
Sodium: 136 mEq/L (ref 135–145)
TCO2: 19 mmol/L (ref 0–100)

## 2013-03-04 LAB — BASIC METABOLIC PANEL
BUN: 15 mg/dL (ref 6–23)
Calcium: 8.3 mg/dL — ABNORMAL LOW (ref 8.4–10.5)
Creatinine, Ser: 1.87 mg/dL — ABNORMAL HIGH (ref 0.50–1.35)
GFR calc Af Amer: 50 mL/min — ABNORMAL LOW (ref >90)
GFR calc non Af Amer: 43 mL/min — ABNORMAL LOW (ref >90)
Glucose, Bld: 79 mg/dL (ref 70–99)
Potassium: 3.5 mEq/L (ref 3.5–5.1)

## 2013-03-04 MED ORDER — HYDROCODONE-ACETAMINOPHEN 5-325 MG PO TABS: 1.0000 | ORAL_TABLET | ORAL | Status: DC | PRN

## 2013-03-04 MED ORDER — METOPROLOL TARTRATE 1 MG/ML IV SOLN
5.0000 mg | Freq: Once | INTRAVENOUS | Status: AC
Start: 2013-03-04 — End: 2013-03-04
  Administered 2013-03-04: 5 mg via INTRAVENOUS
  Filled 2013-03-04: qty 5

## 2013-03-04 MED ORDER — ONDANSETRON HCL 4 MG PO TABS: 4.0000 mg | ORAL_TABLET | Freq: Four times a day (QID) | ORAL | Status: DC

## 2013-03-04 MED ORDER — ONDANSETRON HCL 4 MG/2ML IJ SOLN
4.0000 mg | Freq: Once | INTRAMUSCULAR | Status: AC
  Administered 2013-03-04: 4 mg via INTRAVENOUS
  Filled 2013-03-04: qty 2

## 2013-03-04 MED ORDER — SODIUM CHLORIDE 0.9 % IV BOLUS (SEPSIS)
1000.0000 mL | Freq: Once | INTRAVENOUS | Status: AC
  Administered 2013-03-04: 1000 mL via INTRAVENOUS

## 2013-03-04 MED ORDER — KETOROLAC TROMETHAMINE 30 MG/ML IJ SOLN: 30.0000 mg | Freq: Once | INTRAMUSCULAR | Status: DC

## 2013-03-04 MED ORDER — HYDROCODONE-ACETAMINOPHEN 5-325 MG PO TABS
2.0000 | ORAL_TABLET | Freq: Once | ORAL | Status: AC
  Administered 2013-03-04: 2 via ORAL
  Filled 2013-03-04: qty 2

## 2013-03-04 NOTE — ED Notes (Signed)
I gave the patient a warm blanket. 

## 2013-03-04 NOTE — ED Provider Notes (Signed)
Medical screening examination/treatment/procedure(s) were performed by non-physician practitioner and as supervising physician I was immediately available for consultation/collaboration.  EKG Interpretation    Date/Time:  Saturday March 04 2013 12:37:40 EST Ventricular Rate:  95 PR Interval:  148 QRS Duration: 94 QT Interval:  356 QTC Calculation: 447 R Axis:   19 Text Interpretation:  Normal sinus rhythm Biatrial enlargement Left ventricular hypertrophy with repolarization abnormality Abnormal ECG No significant change since last tracing Confirmed by SHELDON  MD, CHARLES ([id number]) on 03/04/2013 12:44:04 PM              Charles B. Karle Starch, MD 03/04/13 [phone]

## 2013-03-04 NOTE — ED Notes (Signed)
Attemped IV start x2. Second RN to attempt

## 2013-03-04 NOTE — ED Provider Notes (Signed)
CSN: [id number]     Arrival date & time 03/04/13  1231 History   First MD Initiated Contact with Patient 03/04/13 1254     Chief Complaint  Patient presents with  . Chest Pain  . Cough   (Consider location/radiation/quality/duration/timing/severity/associated sxs/prior Treatment) HPI Comments: Pt state that he has been having fever, cough, chills, headache, sore throat, vomiting and diarrhea times 3 days:has tried otc medications without relief:chest hurts with coughing:denies sob  The history is provided by the patient. No language interpreter was used.    Past Medical History  Diagnosis Date  . Hypertension     a. Hx of HTN urgency secondary to noncompliance. b. urinary metanephrine and catecholeamine levels normal 2013.  [name] Anxiety   . GERD (gastroesophageal reflux disease)   . Acute renal failure     June 2012 felt secondary to Toradol  . Chronic renal failure   . CHF (congestive heart failure)     a. 05/2011: Adm with pulm edema/HTN urgency, EF 35-40% with diffuse hypokinesis and moderate to severe mitral regurgitation. Cardiomyopathy likely due to uncontrolled HTN and ETOH abuse - cath deferred due to renal insufficiency (felt due to uncontrolled HTN). bJodie Echevaria MV 06/2011: EF 37% and no ischemia or infarction. c. EF 45-50% by echo 01/2012.  . Cardiomyopathy   . HYPERLIPIDEMIA   . INGUINAL HERNIA   . ETOH abuse     a. Reported to have quit 05/2011.  . Valvular heart disease     a. Echo 05/2011: moderate to severe eccentric MR and mild to moderate AI with prolapsing left coronary cusp. b. Echo 01/2012: mild-mod AI, mild dilitation of aortic root, mild MR.  . CKD (chronic kidney disease)     a. 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  . Tobacco abuse    Past Surgical History  Procedure Laterality Date  . Hernia repair    . Ankle surgery      Fractures bilaterally   Family History  Problem Relation Age of Onset  . Hypertension    . Colon cancer Paternal Uncle    History  Substance  Use Topics  . Smoking status: Current Some Day Smoker -- 0.25 packs/day for 20 years    Types: Cigarettes  . Smokeless tobacco: Never Used  . Alcohol Use: Yes     Comment: used to drink 1 pint of gin a week, trying to stop    Review of Systems  Constitutional: Negative.   Respiratory: Negative.     Allergies  Imdur  Home Medications   Current Outpatient Rx  Name  Route  Sig  Dispense  Refill  . amLODipine (NORVASC) 10 MG tablet   Oral   Take 10 mg by mouth daily.         . carvedilol (COREG) 25 MG tablet   Oral   Take 25 mg by mouth daily.          . hydrALAZINE (APRESOLINE) 25 MG tablet   Oral   Take 25 mg by mouth 3 (three) times daily.         [name] HYDROcodone-acetaminophen (NORCO) 5-325 MG per tablet   Oral   Take 1-2 tablets by mouth every 6 (six) hours as needed for pain.   20 tablet   0   . lisinopril (PRINIVIL,ZESTRIL) 20 MG tablet   Oral   Take 20 mg by mouth daily.          BP 187/127  Pulse 86  Temp(Src) 99.6 F (37.6 C) (Oral)  Resp  22  SpO2 98% Physical Exam  Nursing note and vitals reviewed. Constitutional: He is oriented to person, place, and time. He appears well-developed and well-nourished.  HENT:  Head: Normocephalic and atraumatic.  Right Ear: External ear normal.  Left Ear: External ear normal.  Nose: Rhinorrhea present.  Mouth/Throat: Posterior oropharyngeal erythema present.  Eyes: Conjunctivae and EOM are normal. Pupils are equal, round, and reactive to light.  Neck: Normal range of motion. Neck supple.  Cardiovascular: Normal rate and regular rhythm.   Pulmonary/Chest: Effort normal and breath sounds normal.  Abdominal: Soft. Bowel sounds are normal. There is no tenderness.  Musculoskeletal: Normal range of motion.  Neurological: He is alert and oriented to person, place, and time.  Skin: Skin is warm and dry.  Psychiatric: He has a normal mood and affect.    ED Course  Procedures (including critical care time) Labs  Review Labs Reviewed  BASIC METABOLIC PANEL - Abnormal; Notable for the following:    CO2 18 (*)    Creatinine, Ser 1.87 (*)    Calcium 8.3 (*)    GFR calc non Af Amer 43 (*)    GFR calc Af Amer 50 (*)    All other components within normal limits  CBC - Abnormal; Notable for the following:    WBC 3.0 (*)    Platelets 97 (*)    All other components within normal limits  DIFFERENTIAL - Abnormal; Notable for the following:    Neutrophils Relative % 34 (*)    Neutro Abs 1.0 (*)    Monocytes Relative 21 (*)    All other components within normal limits  POCT I-STAT, CHEM 8 - Abnormal; Notable for the following:    Potassium 3.4 (*)    Creatinine, Ser 2.00 (*)    Calcium, Ion 1.11 (*)    All other components within normal limits   Imaging Review Dg Chest 2 View  03/04/2013   CLINICAL DATA:  Cough, congestion, fever and shortness of breath.  EXAM: CHEST  2 VIEW  COMPARISON:  Chest x-ray 02/20/2013.  FINDINGS: Lung volumes are normal. No consolidative airspace disease. No pleural effusions. No pneumothorax. No pulmonary nodule or mass noted. Pulmonary vasculature is normal. Heart size appears mildly enlarged, with prominence of the left ventricular contour (unchanged). Upper mediastinal contours are within normal limits.  IMPRESSION: 1.  No radiographic evidence of acute cardiopulmonary disease. 2. Mild cardiomegaly with evidence of left ventricular hypertrophy redemonstrated.   Electronically Signed   By: Vinnie Langton M.D.   On: 03/04/2013 13:37    EKG Interpretation    Date/Time:  Saturday March 04 2013 12:37:40 EST Ventricular Rate:  95 PR Interval:  148 QRS Duration: 94 QT Interval:  356 QTC Calculation: 447 R Axis:   19 Text Interpretation:  Normal sinus rhythm Biatrial enlargement Left ventricular hypertrophy with repolarization abnormality Abnormal ECG No significant change since last tracing Confirmed by SHELDON  MD, CHARLES ([id number]) on 03/04/2013 12:44:04 PM             MDM   1. Flu-like symptoms   2. HTN (hypertension)   3. Abnormal WBC count   4. Thrombocytopenia    Pt no vomiting here and tolerating po:pt given 2 liters of fluid here:discussed recheck of labs with pcp:pt presenting as ifluenza like illness, no meds to be given as 3 days [id number] with Dr Karle Starch: will give zofran and hydrocodone     Glendell Docker, NP 03/04/13 1730

## 2013-03-04 NOTE — ED Notes (Signed)
Pt reports flu like symptoms. Having productive cough, fever, diaphoresis, bodyaches, headache x 3 days. Reports chest wall pain with cough and breathing. ekg done at triage, no distress noted.

## 2013-03-05 ENCOUNTER — Telehealth (HOSPITAL_BASED_OUTPATIENT_CLINIC_OR_DEPARTMENT_OTHER): Payer: Self-pay | Admitting: Emergency Medicine

## 2013-03-23 NOTE — Discharge Summary (Signed)
Physician Discharge Summary  [name] [id number] DOB: 1970/03/26 DOA: 02/20/2013  PCP: [name], MD  Admit date: 02/20/2013 Discharge date: Pt left on 02/22/13  Time spent: >25 minutes  NOTE: Patient left AMA early AM prior to a physician evaluation on 12/10- I last evaluated the patient on 12/9  Discharge Diagnoses:  Principal Problem:   Hypertension, malignant Active Problems:   HYPERLIPIDEMIA   SMOKER   HYPERTENSION   Cardiomegaly - hypertensive   CKD (chronic kidney disease)   Hypertensive urgency   Chest pain   Malignant hypertension   Discharge Condition: fair   Filed Weights   02/20/13 1601 02/21/13 0500  Weight: 71 kg (156 lb 8.4 oz) 71 kg (156 lb 8.4 oz)    History of present illness:  [name] is a 43 y.o. male with a past medical history of cardiomyopathy likely secondary to alcohol abuse, TTE 01/26/2012 showed an ejection fraction of 45-50% with diffuse hypokinesis. Also has history of medication nonadherence and hypertensive emergency. Presented to the emergency department with complaints of right arm pain and right-sided chest pain beginning approximately one week ago, characterized as tight/pressure-like, non-radiating, worsened by physical exertion, and associated with shortness of breath. Unfortunately he stopped taking his medications about a week ago as well, and presented to emergency department with a blood pressure of 216/128. He was started on nitroglycerin drip after which she reported improvement to his chest pain. Initial troponin was negative and EKG did not reveal ischemic changes.    Hospital Course:  Hypertension, malignant  - titrated off of Nitro infusion  - In hospital was receiving Lisinopril, Coreg, Hydralazine and Norvasc  - problems with compliance noted  Active Problems:  Systolic CHF  - compensated - ECHO repeated this admission - EF 45-50% with diffuse hypokinesis- possible Tricuspid Aortic valve  Moderate aortic  insufficiency   CKD (chronic kidney disease)  - stage 3  - discussed progression to ESRD if non-compliance with medications is continued  Chest pain  - non-cardiac- resolved   HLP  Cont Stain   SMOKER  Advised to quit   Headache - initially suspected to be due to uncontrolled BP although he also has chronic headache and apparently takes narcotics for them  - Tylenol and Motrin does not help him   Consultations:  cardiology  Discharge Exam: Filed Vitals:   02/22/13 1108  BP: 140/93  Pulse:   Temp:   Resp:     Pt not examined by physician prior to leaving [place] from 12/9 General: No acute respiratory distress  Lungs: Clear to auscultation bilaterally without wheezes or crackles  Cardiovascular: Regular rate and rhythm without murmur gallop or rub normal S1 and S2  Abdomen: Nontender, nondistended, soft, bowel sounds positive, no rebound, no ascites, no appreciable mass  Extremities: No significant cyanosis, clubbing, or edema bilateral lower extremities  Discharge Instructions  NOTE : pt not given prescriptions due to leaving AMA   Medication List    ASK your doctor about these medications       amLODipine 10 MG tablet  Commonly known as:  NORVASC  Take 10 mg by mouth daily.     carvedilol 25 MG tablet  Commonly known as:  COREG  Take 25 mg by mouth daily.     hydrALAZINE 25 MG tablet  Commonly known as:  APRESOLINE  Take 25 mg by mouth 3 (three) times daily.     HYDROcodone-acetaminophen 5-325 MG per tablet  Commonly known as:  NORCO  Take 1-2 tablets by mouth every 6 (six) hours as needed for pain.     lisinopril 20 MG tablet  Commonly known as:  PRINIVIL,ZESTRIL  Take 20 mg by mouth daily.       Allergies  Allergen Reactions  . Imdur [Isosorbide]     headache      The results of significant diagnostics from this hospitalization (including imaging, microbiology, ancillary and laboratory) are listed below for reference.     Significant Diagnostic Studies: Dg Chest 2 View  03/04/2013   CLINICAL DATA:  Cough, congestion, fever and shortness of breath.  EXAM: CHEST  2 VIEW  COMPARISON:  Chest x-ray 02/20/2013.  FINDINGS: Lung volumes are normal. No consolidative airspace disease. No pleural effusions. No pneumothorax. No pulmonary nodule or mass noted. Pulmonary vasculature is normal. Heart size appears mildly enlarged, with prominence of the left ventricular contour (unchanged). Upper mediastinal contours are within normal limits.  IMPRESSION: 1.  No radiographic evidence of acute cardiopulmonary disease. 2. Mild cardiomegaly with evidence of left ventricular hypertrophy redemonstrated.   Electronically Signed   By: Vinnie Langton M.D.   On: 03/04/2013 13:37    Microbiology: No results found for this or any previous visit (from the past 240 hour(s)).   Labs: Basic Metabolic Panel: No results found for this basename: NA, K, CL, CO2, GLUCOSE, BUN, CREATININE, CALCIUM, MG, PHOS,  in the last 168 hours Liver Function Tests: No results found for this basename: AST, ALT, ALKPHOS, BILITOT, PROT, ALBUMIN,  in the last 168 hours No results found for this basename: LIPASE, AMYLASE,  in the last 168 hours No results found for this basename: AMMONIA,  in the last 168 hours CBC: No results found for this basename: WBC, NEUTROABS, HGB, HCT, MCV, PLT,  in the last 168 hours Cardiac Enzymes: No results found for this basename: CKTOTAL, CKMB, CKMBINDEX, TROPONINI,  in the last 168 hours BNP: BNP (last 3 results)  Recent Labs  02/20/13 1130  PROBNP 795.2*   CBG: No results found for this basename: GLUCAP,  in the last 168 hours     Signed:  Debbe Odea, MD  [facility] 02/22/13, 9:12 AM

## 2013-05-05 ENCOUNTER — Emergency Department (HOSPITAL_COMMUNITY): Payer: Medicare Other

## 2013-05-05 ENCOUNTER — Observation Stay (HOSPITAL_COMMUNITY)
Admission: EM | Admit: 2013-05-05 | Discharge: 2013-05-06 | Disposition: A | Discharge status: Home / Self Care | Payer: Medicare Other | Attending: Dermatology | Admitting: Dermatology

## 2013-05-05 ENCOUNTER — Encounter (HOSPITAL_COMMUNITY): Payer: Self-pay | Admitting: Emergency Medicine

## 2013-05-05 DIAGNOSIS — K4090 Unilateral inguinal hernia, without obstruction or gangrene, not specified as recurrent: Secondary | ICD-10-CM | POA: Diagnosis not present

## 2013-05-05 DIAGNOSIS — Z79899 Other long term (current) drug therapy: Secondary | ICD-10-CM | POA: Diagnosis not present

## 2013-05-05 DIAGNOSIS — E785 Hyperlipidemia, unspecified: Secondary | ICD-10-CM | POA: Insufficient documentation

## 2013-05-05 DIAGNOSIS — I38 Endocarditis, valve unspecified: Secondary | ICD-10-CM | POA: Diagnosis not present

## 2013-05-05 DIAGNOSIS — F411 Generalized anxiety disorder: Secondary | ICD-10-CM | POA: Diagnosis not present

## 2013-05-05 DIAGNOSIS — K219 Gastro-esophageal reflux disease without esophagitis: Secondary | ICD-10-CM | POA: Insufficient documentation

## 2013-05-05 DIAGNOSIS — R1033 Periumbilical pain: Secondary | ICD-10-CM | POA: Diagnosis not present

## 2013-05-05 DIAGNOSIS — L301 Dyshidrosis [pompholyx]: Secondary | ICD-10-CM | POA: Diagnosis not present

## 2013-05-05 DIAGNOSIS — I10 Essential (primary) hypertension: Secondary | ICD-10-CM | POA: Diagnosis not present

## 2013-05-05 DIAGNOSIS — R0789 Other chest pain: Principal | ICD-10-CM | POA: Insufficient documentation

## 2013-05-05 DIAGNOSIS — M545 Low back pain, unspecified: Secondary | ICD-10-CM | POA: Insufficient documentation

## 2013-05-05 DIAGNOSIS — R112 Nausea with vomiting, unspecified: Secondary | ICD-10-CM | POA: Diagnosis not present

## 2013-05-05 DIAGNOSIS — I517 Cardiomegaly: Secondary | ICD-10-CM | POA: Insufficient documentation

## 2013-05-05 DIAGNOSIS — R0602 Shortness of breath: Secondary | ICD-10-CM

## 2013-05-05 DIAGNOSIS — R519 Headache, unspecified: Secondary | ICD-10-CM

## 2013-05-05 DIAGNOSIS — N189 Chronic kidney disease, unspecified: Secondary | ICD-10-CM | POA: Insufficient documentation

## 2013-05-05 DIAGNOSIS — R51 Headache: Secondary | ICD-10-CM | POA: Diagnosis not present

## 2013-05-05 DIAGNOSIS — I359 Nonrheumatic aortic valve disorder, unspecified: Secondary | ICD-10-CM

## 2013-05-05 DIAGNOSIS — R079 Chest pain, unspecified: Secondary | ICD-10-CM

## 2013-05-05 DIAGNOSIS — I509 Heart failure, unspecified: Secondary | ICD-10-CM | POA: Diagnosis not present

## 2013-05-05 DIAGNOSIS — I119 Hypertensive heart disease without heart failure: Secondary | ICD-10-CM

## 2013-05-05 DIAGNOSIS — I160 Hypertensive urgency: Secondary | ICD-10-CM | POA: Diagnosis present

## 2013-05-05 DIAGNOSIS — R000 Tachycardia, unspecified: Secondary | ICD-10-CM | POA: Insufficient documentation

## 2013-05-05 DIAGNOSIS — M542 Cervicalgia: Secondary | ICD-10-CM | POA: Insufficient documentation

## 2013-05-05 DIAGNOSIS — F17200 Nicotine dependence, unspecified, uncomplicated: Secondary | ICD-10-CM

## 2013-05-05 DIAGNOSIS — K649 Unspecified hemorrhoids: Secondary | ICD-10-CM | POA: Insufficient documentation

## 2013-05-05 DIAGNOSIS — R109 Unspecified abdominal pain: Secondary | ICD-10-CM | POA: Diagnosis not present

## 2013-05-05 DIAGNOSIS — I129 Hypertensive chronic kidney disease with stage 1 through stage 4 chronic kidney disease, or unspecified chronic kidney disease: Secondary | ICD-10-CM | POA: Diagnosis not present

## 2013-05-05 LAB — ETHANOL: Alcohol, Ethyl (B): <11 mg/dL (ref 0–11)

## 2013-05-05 LAB — TROPONIN I: Troponin I: <0.3 ng/mL (ref <0.30)

## 2013-05-05 LAB — BASIC METABOLIC PANEL
BUN: 14 mg/dL (ref 6–23)
CALCIUM: 9.4 mg/dL (ref 8.4–10.5)
CO2: 23 mEq/L (ref 19–32)
Chloride: 102 mEq/L (ref 96–112)
Creatinine, Ser: 1.48 mg/dL — ABNORMAL HIGH (ref 0.50–1.35)
GFR calc Af Amer: 67 mL/min — ABNORMAL LOW (ref >90)
GFR, EST NON AFRICAN AMERICAN: 58 mL/min — AB (ref >90)
GLUCOSE: 92 mg/dL (ref 70–99)
Potassium: 4.2 mEq/L (ref 3.7–5.3)
Sodium: 139 mEq/L (ref 137–147)

## 2013-05-05 LAB — URINALYSIS, ROUTINE W REFLEX MICROSCOPIC
Bilirubin Urine: NEGATIVE
GLUCOSE, UA: NEGATIVE mg/dL
Hgb urine dipstick: NEGATIVE
Ketones, ur: 15 mg/dL — AB
Leukocytes, UA: NEGATIVE
Nitrite: NEGATIVE
PH: 6 (ref 5.0–8.0)
Protein, ur: >300 mg/dL — AB
Specific Gravity, Urine: 1.02 (ref 1.005–1.030)
Urobilinogen, UA: 0.2 mg/dL (ref 0.0–1.0)

## 2013-05-05 LAB — CBC
HCT: 40.7 % (ref 39.0–52.0)
HEMOGLOBIN: 14.3 g/dL (ref 13.0–17.0)
MCH: 32.6 pg (ref 26.0–34.0)
MCHC: 35.1 g/dL (ref 30.0–36.0)
MCV: 92.7 fL (ref 78.0–100.0)
Platelets: 152 10*3/uL (ref 150–400)
RBC: 4.39 MIL/uL (ref 4.22–5.81)
RDW: 14.1 % (ref 11.5–15.5)
WBC: 7.1 10*3/uL (ref 4.0–10.5)

## 2013-05-05 LAB — URINE MICROSCOPIC-ADD ON

## 2013-05-05 LAB — RAPID URINE DRUG SCREEN, HOSP PERFORMED
Amphetamines: NOT DETECTED
BENZODIAZEPINES: NOT DETECTED
Barbiturates: NOT DETECTED
COCAINE: NOT DETECTED
OPIATES: NOT DETECTED
Tetrahydrocannabinol: POSITIVE — AB

## 2013-05-05 LAB — PRO B NATRIURETIC PEPTIDE: Pro B Natriuretic peptide (BNP): 1915 pg/mL — ABNORMAL HIGH (ref 0–125)

## 2013-05-05 LAB — I-STAT TROPONIN, ED: Troponin i, poc: 0.02 ng/mL (ref 0.00–0.08)

## 2013-05-05 LAB — POC OCCULT BLOOD, ED: Fecal Occult Bld: POSITIVE — AB

## 2013-05-05 MED ORDER — DICLOFENAC SODIUM 1 % TD GEL
2.0000 g | Freq: Four times a day (QID) | TRANSDERMAL | Status: DC
  Administered 2013-05-05 – 2013-05-06 (×3): 2 g via TOPICAL
  Filled 2013-05-05: qty 100

## 2013-05-05 MED ORDER — BISACODYL 10 MG RE SUPP: 10.0000 mg | Freq: Every day | RECTAL | Status: DC | PRN

## 2013-05-05 MED ORDER — FUROSEMIDE 20 MG PO TABS
20.0000 mg | ORAL_TABLET | Freq: Two times a day (BID) | ORAL | Status: DC
  Administered 2013-05-05 – 2013-05-06 (×2): 20 mg via ORAL
  Filled 2013-05-05 (×5): qty 1

## 2013-05-05 MED ORDER — AMLODIPINE BESYLATE 10 MG PO TABS
10.0000 mg | ORAL_TABLET | Freq: Once | ORAL | Status: AC
  Administered 2013-05-05: 10 mg via ORAL
  Filled 2013-05-05: qty 1

## 2013-05-05 MED ORDER — ACETAMINOPHEN 650 MG RE SUPP: 650.0000 mg | Freq: Four times a day (QID) | RECTAL | Status: DC | PRN

## 2013-05-05 MED ORDER — SODIUM CHLORIDE 0.9 % IJ SOLN
3.0000 mL | Freq: Two times a day (BID) | INTRAMUSCULAR | Status: DC
  Administered 2013-05-05 – 2013-05-06 (×2): 3 mL via INTRAVENOUS

## 2013-05-05 MED ORDER — CARVEDILOL 25 MG PO TABS
25.0000 mg | ORAL_TABLET | Freq: Once | ORAL | Status: AC
  Administered 2013-05-05: 25 mg via ORAL
  Filled 2013-05-05: qty 1

## 2013-05-05 MED ORDER — LISINOPRIL 20 MG PO TABS
20.0000 mg | ORAL_TABLET | Freq: Once | ORAL | Status: AC
  Administered 2013-05-05: 20 mg via ORAL
  Filled 2013-05-05: qty 1

## 2013-05-05 MED ORDER — LISINOPRIL 20 MG PO TABS
20.0000 mg | ORAL_TABLET | Freq: Every day | ORAL | Status: DC
  Administered 2013-05-06: 20 mg via ORAL
  Filled 2013-05-05: qty 1

## 2013-05-05 MED ORDER — POLYETHYLENE GLYCOL 3350 17 G PO PACK
17.0000 g | PACK | Freq: Two times a day (BID) | ORAL | Status: DC
  Filled 2013-05-05: qty 1

## 2013-05-05 MED ORDER — ACETAMINOPHEN 325 MG PO TABS
650.0000 mg | ORAL_TABLET | Freq: Four times a day (QID) | ORAL | Status: DC | PRN
  Filled 2013-05-05: qty 2

## 2013-05-05 MED ORDER — CLONIDINE HCL 0.1 MG PO TABS
0.1000 mg | ORAL_TABLET | Freq: Two times a day (BID) | ORAL | Status: DC
  Administered 2013-05-05 – 2013-05-06 (×2): 0.1 mg via ORAL
  Filled 2013-05-05 (×3): qty 1

## 2013-05-05 MED ORDER — ONDANSETRON HCL 4 MG PO TABS: 4.0000 mg | ORAL_TABLET | Freq: Four times a day (QID) | ORAL | Status: DC | PRN

## 2013-05-05 MED ORDER — ASPIRIN EC 81 MG PO TBEC
81.0000 mg | DELAYED_RELEASE_TABLET | Freq: Every day | ORAL | Status: DC
  Administered 2013-05-05 – 2013-05-06 (×2): 81 mg via ORAL
  Filled 2013-05-05 (×2): qty 1

## 2013-05-05 MED ORDER — CYCLOBENZAPRINE HCL 5 MG PO TABS
5.0000 mg | ORAL_TABLET | Freq: Three times a day (TID) | ORAL | Status: DC | PRN
  Administered 2013-05-05 – 2013-05-06 (×2): 5 mg via ORAL
  Filled 2013-05-05 (×2): qty 1

## 2013-05-05 MED ORDER — HYDROMORPHONE HCL PF 1 MG/ML IJ SOLN
1.0000 mg | INTRAMUSCULAR | Status: AC | PRN
  Administered 2013-05-05 – 2013-05-06 (×2): 1 mg via INTRAVENOUS
  Filled 2013-05-05 (×2): qty 1

## 2013-05-05 MED ORDER — SENNA 8.6 MG PO TABS
2.0000 | ORAL_TABLET | Freq: Two times a day (BID) | ORAL | Status: DC
  Administered 2013-05-05 – 2013-05-06 (×2): 17.2 mg via ORAL
  Filled 2013-05-05 (×3): qty 2

## 2013-05-05 MED ORDER — MORPHINE SULFATE 4 MG/ML IJ SOLN
4.0000 mg | Freq: Once | INTRAMUSCULAR | Status: AC
  Administered 2013-05-05: 4 mg via INTRAVENOUS
  Filled 2013-05-05: qty 1

## 2013-05-05 MED ORDER — ONDANSETRON HCL 4 MG/2ML IJ SOLN: 4.0000 mg | Freq: Four times a day (QID) | INTRAMUSCULAR | Status: DC | PRN

## 2013-05-05 MED ORDER — DIAZEPAM 5 MG/ML IJ SOLN
5.0000 mg | Freq: Once | INTRAMUSCULAR | Status: AC
  Administered 2013-05-05: 5 mg via INTRAVENOUS
  Filled 2013-05-05: qty 2

## 2013-05-05 MED ORDER — AMLODIPINE BESYLATE 10 MG PO TABS
10.0000 mg | ORAL_TABLET | Freq: Every day | ORAL | Status: DC
  Administered 2013-05-06: 10 mg via ORAL
  Filled 2013-05-05: qty 1

## 2013-05-05 MED ORDER — CARVEDILOL 25 MG PO TABS
25.0000 mg | ORAL_TABLET | Freq: Every day | ORAL | Status: DC
  Administered 2013-05-06: 25 mg via ORAL
  Filled 2013-05-05: qty 1

## 2013-05-05 MED ORDER — ONDANSETRON HCL 4 MG/2ML IJ SOLN: 4.0000 mg | Freq: Three times a day (TID) | INTRAMUSCULAR | Status: AC | PRN

## 2013-05-05 MED ORDER — HYDRALAZINE HCL 20 MG/ML IJ SOLN
20.0000 mg | Freq: Once | INTRAMUSCULAR | Status: AC
  Administered 2013-05-05: 20 mg via INTRAVENOUS
  Filled 2013-05-05: qty 1

## 2013-05-05 MED ORDER — POLYETHYLENE GLYCOL 3350 17 G PO PACK: 34.0000 g | PACK | Freq: Two times a day (BID) | ORAL | Status: DC

## 2013-05-05 MED ORDER — HYDRALAZINE HCL 25 MG PO TABS
25.0000 mg | ORAL_TABLET | Freq: Three times a day (TID) | ORAL | Status: DC
  Administered 2013-05-05 – 2013-05-06 (×3): 25 mg via ORAL
  Filled 2013-05-05 (×4): qty 1

## 2013-05-05 MED ORDER — SODIUM CHLORIDE 0.9 % IV SOLN
80.0000 mg | Freq: Once | INTRAVENOUS | Status: AC
  Administered 2013-05-05: 80 mg via INTRAVENOUS
  Filled 2013-05-05: qty 80

## 2013-05-05 MED ORDER — ONDANSETRON HCL 4 MG/2ML IJ SOLN
4.0000 mg | Freq: Once | INTRAMUSCULAR | Status: AC
  Administered 2013-05-05: 4 mg via INTRAVENOUS
  Filled 2013-05-05: qty 2

## 2013-05-05 MED ORDER — POLYETHYLENE GLYCOL 3350 17 G PO PACK
34.0000 g | PACK | Freq: Two times a day (BID) | ORAL | Status: DC
  Administered 2013-05-05 – 2013-05-06 (×2): 34 g via ORAL
  Filled 2013-05-05 (×3): qty 2

## 2013-05-05 MED ORDER — HEPARIN SODIUM (PORCINE) 5000 UNIT/ML IJ SOLN
5000.0000 [IU] | Freq: Three times a day (TID) | INTRAMUSCULAR | Status: DC
  Administered 2013-05-05: 5000 [IU] via SUBCUTANEOUS
  Filled 2013-05-05 (×5): qty 1

## 2013-05-05 NOTE — ED Notes (Signed)
Admitting at bedside 

## 2013-05-05 NOTE — ED Notes (Signed)
Pt with multiple complaints including: CP x 1 week associated with SOB, abdominal pain, headache, back pain, and hemorrhoid pain.

## 2013-05-05 NOTE — H&P (Signed)
[facility] Admission History and Physical Service Pager: [phone]  Patient name: [name] Medical record number: [id number] Date of birth: 1970/09/17 Age: 43 y.o. Gender: male  Primary Care Provider: [name], MD Consultants: Cardiology Code Status: Full  Chief Complaint: CP and SOB  Assessment and Plan: [name] is a 43 y.o. male presenting with SOB and CP concerning for CHF exacerbation and concern for MI due to hypertensive emergency. PMH is significant for HTN, mild MR and mod AI w/ sCHF EF to 45-50%, CKD, ETOH/Drug/Tobacco use, HLD, HAs, hemorrhoids.   CHF: EF 45-50% w/ mild MR and Mod AI. Likely in exacerbation (diastolic and systolic) given BNP of 99991111 and need to sleep sitting up. Cardiomegaly on CXR w/o pulm congestion. No O2 requirement. Likely secondary to uncontrolled HTN and MR/AI.  - Admit to tele - consult [facility] in the am - BP control as below - Lasix 20 PO BID (diuretic naive)  - strict I/O and daily wt  HTN: hypertensive emergency given pressures as high as 212/142 w/ CP and HA. Concern for HTN cardiomyopathy. Question pts compliance as states he takes all 4 of his home antihypertensives.  - Tele as above - Continue home Norvasc, coreg, hydralazine, and lisinopril - lasix as above - Add clonidine 0.1 BID (first dose now) May give second dose of 0.1 if needed tonight - cycle troponins - monitor closely for hypotension after starting meds.  - start ASA81  Neck shoulder and back pain: likely all MSK related due to recent change in sleeping habits (now upright in chair), and poor sleep for days. Tender to palpation along muscle groups w/ some stiffness. No evidence of infectious cause. Given dilaudid in the ED w/ some benefit.  - Start Flexeril - Voltaren gel (systemic NSAIDs not an option due to CKD) - PT  HA: appears to be baseline condition for pt. Likely exacerbated by recent lack of sleep and sleeping when able  in upright position.  - Monitor for signs of ICP or meningitis.  - Tylenol PRN  ABD pain: likely secondary to constipation as only having 1-2 small hard BMs wkly. Hemocult + likely from actively bleeding internal hemorrhoid as noted by the ED physician. Stool felt on palpation. No WBC.  - Start miralax and Senna BID scheduled and PRN Ducolax suppository - consider Enema if needed - AXR if worsens  CKD II: appears to be at baseline. GFR calculated to be 67.  - BMET in am.  - avoid renally toxic medications if possible.   FEN/GI: heart healthy diet.   Prophylaxis:  SQ hep  Disposition: pending clinical improvement  History of Present Illness: [name] is a 43 y.o. male presenting with neck pain and HA and Abd pain. Started 2 wks ago w/ CP and SOB started first. Getting worse. Unable to sleep lying down during this time. Unable to sleep at night (approximately 2-3 hrs at night). Neck pain just started this morning upon waking. Radiates down R shoulder to deltoid. Also w/ lumbar perispinal muscle tenderenss. Feels stiff. Feels like similar symptoms to other CHF exacerbations. Denies swelling in legs. Pt reports losing 20lbs over the past month. Pt reports compliance w/ all 4 antihypertensive medications. Endorses subjective fevers adn chills but denies sick contacts. BP typically runs around 192 per pt. 4 goodies w/ some benefit. Abd started yesterday. Feels like gassy pain. 2BM wkly w/ only small hard stool passing. Pt followed by [facility] but last appt around  8 mo ago.   Review Of Systems: Per HPI with the following additions: Otherwise 12 point review of systems was performed and was unremarkable.  Patient Active Problem List   Diagnosis Date Noted  . Chest pain 02/20/2013  . Malignant hypertension 02/20/2013  . Hypertensive urgency 01/27/2012  . Thrombocytopenia 01/27/2012  . Cardiomyopathy, hypertensive 01/26/2012  . AI (aortic insufficiency) 01/26/2012  . MR (mitral  regurgitation) 01/26/2012  . CKD (chronic kidney disease) 01/26/2012  . Hematochezia 07/19/2011  . Systolic CHF, chronic [id number]  . Pneumonia 05/25/2011  . Chronic bronchitis 05/23/2011  . Hypertensive emergency 05/22/2011  . CHF (congestive heart failure) 05/22/2011  . Cardiomegaly - hypertensive 05/22/2011  . Chest pain 05/22/2011  . SOB (shortness of breath) 05/22/2011  . Tobacco abuse 05/22/2011  . Marijuana use 05/22/2011  . Abdominal pain 05/22/2011  . Hypertension, malignant 05/22/2011  . Anxiety and depression 05/22/2011  . CRI (chronic renal insufficiency) 05/22/2011  . URI (upper respiratory infection) 05/22/2011  . Proteinuria 05/22/2011  . Acute diastolic heart failure [id number]  . HYPERLIPIDEMIA 08/26/2009  . SMOKER 08/26/2009  . Headache 08/26/2009  . AORTIC REGURGITATION, MILD 03/11/2009  . INGUINAL HERNIA 02/18/2009  . HYPERTENSION 01/16/2009  . MURMUR 01/16/2009   Past Medical History: Past Medical History  Diagnosis Date  . Hypertension     a. Hx of HTN urgency secondary to noncompliance. b. urinary metanephrine and catecholeamine levels normal 2013.  [name] Anxiety   . GERD (gastroesophageal reflux disease)   . Acute renal failure     June 2012 felt secondary to Toradol  . Chronic renal failure   . CHF (congestive heart failure)     a. 05/2011: Adm with pulm edema/HTN urgency, EF 35-40% with diffuse hypokinesis and moderate to severe mitral regurgitation. Cardiomyopathy likely due to uncontrolled HTN and ETOH abuse - cath deferred due to renal insufficiency (felt due to uncontrolled HTN). bJodie Echevaria MV 06/2011: EF 37% and no ischemia or infarction. c. EF 45-50% by echo 01/2012.  . Cardiomyopathy   . HYPERLIPIDEMIA   . INGUINAL HERNIA   . ETOH abuse     a. Reported to have quit 05/2011.  . Valvular heart disease     a. Echo 05/2011: moderate to severe eccentric MR and mild to moderate AI with prolapsing left coronary cusp. b. Echo 01/2012: mild-mod AI, mild  dilitation of aortic root, mild MR.  . CKD (chronic kidney disease)     a. 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  . Tobacco abuse    Past Surgical History: Past Surgical History  Procedure Laterality Date  . Hernia repair    . Ankle surgery      Fractures bilaterally   Social History: History  Substance Use Topics  . Smoking status: Current Some Day Smoker -- 0.25 packs/day for 20 years    Types: Cigarettes  . Smokeless tobacco: Never Used  . Alcohol Use: Yes     Comment: used to drink 1 pint of gin a week, trying to stop   Additional social history: Marijuana and occasional ETOH use and 0.25 ppd smoker.  Please also refer to relevant sections of EMR.  Family History: Family History  Problem Relation Age of Onset  . Hypertension    . Colon cancer Paternal Uncle    Allergies and Medications: Allergies  Allergen Reactions  . Imdur [Isosorbide]     headache   No current facility-administered medications on file prior to encounter.   Current Outpatient Prescriptions on File Prior to Encounter  Medication  Sig Dispense Refill  . amLODipine (NORVASC) 10 MG tablet Take 10 mg by mouth daily.      . carvedilol (COREG) 25 MG tablet Take 25 mg by mouth daily.       . hydrALAZINE (APRESOLINE) 25 MG tablet Take 25 mg by mouth 3 (three) times daily.      [name] ibuprofen (ADVIL,MOTRIN) 200 MG tablet Take 200 mg by mouth every 6 (six) hours as needed for mild pain.      [name] lisinopril (PRINIVIL,ZESTRIL) 20 MG tablet Take 20 mg by mouth daily.      . ondansetron (ZOFRAN) 4 MG tablet Take 1 tablet (4 mg total) by mouth every 6 (six) hours.  12 tablet  0    Objective: BP 199/132  Pulse 88  Temp(Src) 98.4 F (36.9 C) (Oral)  Resp 33  Ht 6' (1.829 m)  Wt 161 lb 8 oz (73.256 kg)  BMI 21.90 kg/m2  SpO2 96% Exam: General: Thin male in mild distress sitting upright in bed HEENT: mmm, EOMI Cardiovascular: Diastolic decrescendo murmur, RRR, intact distal pulses, No JVD Respiratory: CTAB, nml effort,  no ronchi/crackles/consolidations Abdomen: NABS, stool burden felt on the L. Mcburny's point w/o pain and no murphy's sign Extremities: warm well perfused. No edema of L or UE.  Skin: intact no rash Neuro: CN2-12 grossly intact. Moves all extremities w/ purpose MSK: neck and arm FROM but ttp along the R side of neck down to R shoulder. Mild pain on palpation of perispinal muscles of the midback.   Labs and Imaging: Results for orders placed during the hospital encounter of 05/05/13 (from the past 24 hour(s))  CBC     Status: None   Collection Time    05/05/13  3:06 PM      Result Value Ref Range   WBC 7.1  4.0 - 10.5 K/uL   RBC 4.39  4.22 - 5.81 MIL/uL   Hemoglobin 14.3  13.0 - 17.0 g/dL   HCT 40.7  39.0 - 52.0 %   MCV 92.7  78.0 - 100.0 fL   MCH 32.6  26.0 - 34.0 pg   MCHC 35.1  30.0 - 36.0 g/dL   RDW 14.1  11.5 - 15.5 %   Platelets 152  150 - 400 K/uL  BASIC METABOLIC PANEL     Status: Abnormal   Collection Time    05/05/13  3:06 PM      Result Value Ref Range   Sodium 139  137 - 147 mEq/L   Potassium 4.2  3.7 - 5.3 mEq/L   Chloride 102  96 - 112 mEq/L   CO2 23  19 - 32 mEq/L   Glucose, Bld 92  70 - 99 mg/dL   BUN 14  6 - 23 mg/dL   Creatinine, Ser 1.48 (*) 0.50 - 1.35 mg/dL   Calcium 9.4  8.4 - 10.5 mg/dL   GFR calc non Af Amer 58 (*) >90 mL/min   GFR calc Af Amer 67 (*) >90 mL/min  PRO B NATRIURETIC PEPTIDE     Status: Abnormal   Collection Time    05/05/13  3:06 PM      Result Value Ref Range   Pro B Natriuretic peptide (BNP) 1915.0 (*) 0 - 125 pg/mL  I-STAT TROPOININ, ED     Status: None   Collection Time    05/05/13  3:33 PM      Result Value Ref Range   Troponin i, poc 0.02  0.00 - 0.08 ng/mL  Comment 3           ETHANOL     Status: None   Collection Time    05/05/13  3:57 PM      Result Value Ref Range   Alcohol, Ethyl (B) <11  0 - 11 mg/dL  POC OCCULT BLOOD, ED     Status: Abnormal   Collection Time    05/05/13  4:32 PM      Result Value Ref Range    Fecal Occult Bld POSITIVE (*) NEGATIVE  URINALYSIS, ROUTINE W REFLEX MICROSCOPIC     Status: Abnormal   Collection Time    05/05/13  4:55 PM      Result Value Ref Range   Color, Urine YELLOW  YELLOW   APPearance CLEAR  CLEAR   Specific Gravity, Urine 1.020  1.005 - 1.030   pH 6.0  5.0 - 8.0   Glucose, UA NEGATIVE  NEGATIVE mg/dL   Hgb urine dipstick NEGATIVE  NEGATIVE   Bilirubin Urine NEGATIVE  NEGATIVE   Ketones, ur 15 (*) NEGATIVE mg/dL   Protein, ur >300 (*) NEGATIVE mg/dL   Urobilinogen, UA 0.2  0.0 - 1.0 mg/dL   Nitrite NEGATIVE  NEGATIVE   Leukocytes, UA NEGATIVE  NEGATIVE  URINE RAPID DRUG SCREEN (HOSP PERFORMED)     Status: Abnormal   Collection Time    05/05/13  4:55 PM      Result Value Ref Range   Opiates NONE DETECTED  NONE DETECTED   Cocaine NONE DETECTED  NONE DETECTED   Benzodiazepines NONE DETECTED  NONE DETECTED   Amphetamines NONE DETECTED  NONE DETECTED   Tetrahydrocannabinol POSITIVE (*) NONE DETECTED   Barbiturates NONE DETECTED  NONE DETECTED  URINE MICROSCOPIC-ADD ON     Status: None   Collection Time    05/05/13  4:55 PM      Result Value Ref Range   Squamous Epithelial / LPF RARE  RARE   WBC, UA 0-2  <3 WBC/hpf   Dg Chest 2 View  05/05/2013   CLINICAL DATA:  Chest and abdominal discomfort for 8 days with his and constipation and urinary frequency  EXAM: CHEST  2 VIEW  COMPARISON:  DG CHEST 2 VIEW dated 03/04/2013  FINDINGS: The cardiac silhouette remains mildly enlarged. There is tortuosity of the ascending and descending thoracic aorta. The lungs are adequately inflated. There is mild prominence of the central pulmonary vascularity which has developed since the previous study. The interstitial markings of both lungs also are slightly more conspicuous. There is no pleural effusion or alveolar infiltrate. The observed portions of the bony thorax exhibit no acute abnormalities.  IMPRESSION: The findings may reflect low-grade CHF with mild interstitial edema.  There has been an increase in the cardiac silhouette size since the previous study.   Electronically Signed   By: David  Martinique   On: 05/05/2013 16:11    Waldemar Dickens, MD 05/05/2013, 7:06 PM PGY-3, [place] [facility] pager: [phone], text pages welcome

## 2013-05-05 NOTE — ED Provider Notes (Signed)
CSN: [id number]     Arrival date & time 05/05/13  1456 History   First MD Initiated Contact with Patient 05/05/13 1523     Chief Complaint  Patient presents with  . Chest Pain  . Shortness of Breath  . Abdominal Pain  . Headache  . Back Pain  . Hemorrhoids     (Consider location/radiation/quality/duration/timing/severity/associated sxs/prior Treatment) The history is provided by the patient and medical records. No language interpreter was used.    [name] is a 43 y.o. male  with a hx of HTN, GERD, chronic renal failure, CHF, cardiomyopathy,  presents to the Emergency Department complaining of gradual, persistent, progressively worsening SOB onset 1 month ago.  Pt also c/o generalized headache for 1 week and left sided neck pain this morning.  Pt also c/o periumbilical abd pain and low back pain for 2-3 days.  Pt reports no medical care for any of these issues.  Pt reports noncompliance with his HTN meds for the last 4 days due to vomiting.  Emesis is NBNB.  Associated symptoms include nausea every morning for 2 weeks.  Pt reports takes 4 Goody powders every morning for > 1 week.  Pt reports he just started back drinking EtOH last weekend before these symptoms began.  Pt denies fever, chills, CP, diarrhea, weakness, dizziness, syncope, dysuria.  He does endorse an increased urination up to 12 times per day. Denies Hx of DM.      Past Medical History  Diagnosis Date  . Hypertension     a. Hx of HTN urgency secondary to noncompliance. b. urinary metanephrine and catecholeamine levels normal 2013.  [name] Anxiety   . GERD (gastroesophageal reflux disease)   . Acute renal failure     June 2012 felt secondary to Toradol  . Chronic renal failure   . CHF (congestive heart failure)     a. 05/2011: Adm with pulm edema/HTN urgency, EF 35-40% with diffuse hypokinesis and moderate to severe mitral regurgitation. Cardiomyopathy likely due to uncontrolled HTN and ETOH abuse - cath deferred due to  renal insufficiency (felt due to uncontrolled HTN). bJodie Echevaria MV 06/2011: EF 37% and no ischemia or infarction. c. EF 45-50% by echo 01/2012.  . Cardiomyopathy   . HYPERLIPIDEMIA   . INGUINAL HERNIA   . ETOH abuse     a. Reported to have quit 05/2011.  . Valvular heart disease     a. Echo 05/2011: moderate to severe eccentric MR and mild to moderate AI with prolapsing left coronary cusp. b. Echo 01/2012: mild-mod AI, mild dilitation of aortic root, mild MR.  . CKD (chronic kidney disease)     a. 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  . Tobacco abuse    Past Surgical History  Procedure Laterality Date  . Hernia repair    . Ankle surgery      Fractures bilaterally   Family History  Problem Relation Age of Onset  . Hypertension    . Colon cancer Paternal Uncle    History  Substance Use Topics  . Smoking status: Current Some Day Smoker -- 0.25 packs/day for 20 years    Types: Cigarettes  . Smokeless tobacco: Never Used  . Alcohol Use: Yes     Comment: used to drink 1 pint of gin a week, trying to stop    Review of Systems  Constitutional: Positive for diaphoresis. Negative for fever, appetite change, fatigue and unexpected weight change.  HENT: Negative for mouth sores.   Eyes: Negative for visual  disturbance.  Respiratory: Positive for chest tightness and shortness of breath. Negative for cough and wheezing.   Cardiovascular: Positive for chest pain.  Gastrointestinal: Positive for nausea, vomiting and abdominal pain. Negative for diarrhea and constipation.  Endocrine: Negative for polydipsia, polyphagia and polyuria.  Genitourinary: Positive for flank pain. Negative for dysuria, urgency, frequency and hematuria.  Musculoskeletal: Positive for neck pain (right sided). Negative for back pain and neck stiffness.  Skin: Negative for rash.  Allergic/Immunologic: Negative for immunocompromised state.  Neurological: Positive for headaches. Negative for syncope and light-headedness.   Hematological: Does not bruise/bleed easily.  Psychiatric/Behavioral: Negative for sleep disturbance. The patient is not nervous/anxious.       Allergies  Imdur  Home Medications   Current Outpatient Rx  Name  Route  Sig  Dispense  Refill  . amLODipine (NORVASC) 10 MG tablet   Oral   Take 10 mg by mouth daily.         . carvedilol (COREG) 25 MG tablet   Oral   Take 25 mg by mouth daily.          . hydrALAZINE (APRESOLINE) 25 MG tablet   Oral   Take 25 mg by mouth 3 (three) times daily.         [name] ibuprofen (ADVIL,MOTRIN) 200 MG tablet   Oral   Take 200 mg by mouth every 6 (six) hours as needed for mild pain.         [name] lisinopril (PRINIVIL,ZESTRIL) 20 MG tablet   Oral   Take 20 mg by mouth daily.         . ondansetron (ZOFRAN) 4 MG tablet   Oral   Take 1 tablet (4 mg total) by mouth every 6 (six) hours.   12 tablet   0    BP 199/132  Pulse 88  Temp(Src) 98.4 F (36.9 C) (Oral)  Resp 33  Ht 6' (1.829 m)  Wt 161 lb 8 oz (73.256 kg)  BMI 21.90 kg/m2  SpO2 96% Physical Exam  Nursing note and vitals reviewed. Constitutional: He is oriented to person, place, and time. He appears well-developed and well-nourished. No distress.  Awake, alert, nontoxic appearance  HENT:  Head: Normocephalic and atraumatic.  Mouth/Throat: Oropharynx is clear and moist. No oropharyngeal exudate.  Eyes: Conjunctivae and EOM are normal. Pupils are equal, round, and reactive to light. No scleral icterus.  Neck: Normal range of motion. Neck supple.  Cardiovascular: Regular rhythm, normal heart sounds and intact distal pulses.   No murmur heard. Tachycardia  Pulmonary/Chest: Effort normal and breath sounds normal. No respiratory distress. He has no wheezes. He has no rales.  Clear and equal breath sounds tachypnea  Abdominal: Soft. Normal appearance and bowel sounds are normal. He exhibits no distension and no mass. There is generalized tenderness. There is guarding and CVA  tenderness (bilateral). There is no rebound.  Genitourinary: Rectal exam shows external hemorrhoid. Rectal exam shows no internal hemorrhoid, no fissure, no mass, no tenderness and anal tone normal. Guaiac positive stool. Prostate is not enlarged and not tender.  Musculoskeletal: Normal range of motion. He exhibits no edema.  Lymphadenopathy:    He has no cervical adenopathy.  Neurological: He is alert and oriented to person, place, and time. He has normal reflexes. No cranial nerve deficit. He exhibits normal muscle tone. Coordination normal.  Speech is clear and goal oriented, follows commands Cranial nerves III - XII without deficit, no facial droop Normal strength in upper and lower extremities bilaterally,  strong and equal grip strength Sensation normal to light and sharp touch Moves extremities without ataxia, coordination intact Normal finger to nose and rapid alternating movements Neg modified romberg, no pronator drift  Skin: Skin is warm and dry. No rash noted. He is not diaphoretic. No erythema.  Psychiatric: He has a normal mood and affect. His behavior is normal. Judgment and thought content normal.    ED Course  Procedures (including critical care time) Labs Review Labs Reviewed  BASIC METABOLIC PANEL - Abnormal; Notable for the following:    Creatinine, Ser 1.48 (*)    GFR calc non Af Amer 58 (*)    GFR calc Af Amer 67 (*)    All other components within normal limits  PRO B NATRIURETIC PEPTIDE - Abnormal; Notable for the following:    Pro B Natriuretic peptide (BNP) 1915.0 (*)    All other components within normal limits  URINALYSIS, ROUTINE W REFLEX MICROSCOPIC - Abnormal; Notable for the following:    Ketones, ur 15 (*)    Protein, ur >300 (*)    All other components within normal limits  URINE RAPID DRUG SCREEN (HOSP PERFORMED) - Abnormal; Notable for the following:    Tetrahydrocannabinol POSITIVE (*)    All other components within normal limits  POC OCCULT  BLOOD, ED - Abnormal; Notable for the following:    Fecal Occult Bld POSITIVE (*)    All other components within normal limits  CBC  ETHANOL  URINE MICROSCOPIC-ADD ON  I-STAT TROPOININ, ED   Imaging Review Dg Chest 2 View  05/05/2013   CLINICAL DATA:  Chest and abdominal discomfort for 8 days with his and constipation and urinary frequency  EXAM: CHEST  2 VIEW  COMPARISON:  DG CHEST 2 VIEW dated 03/04/2013  FINDINGS: The cardiac silhouette remains mildly enlarged. There is tortuosity of the ascending and descending thoracic aorta. The lungs are adequately inflated. There is mild prominence of the central pulmonary vascularity which has developed since the previous study. The interstitial markings of both lungs also are slightly more conspicuous. There is no pleural effusion or alveolar infiltrate. The observed portions of the bony thorax exhibit no acute abnormalities.  IMPRESSION: The findings may reflect low-grade CHF with mild interstitial edema. There has been an increase in the cardiac silhouette size since the previous study.   Electronically Signed   By: David  Martinique   On: 05/05/2013 16:11    EKG Interpretation    Date/Time:  Friday May 05 2013 15:02:44 EST Ventricular Rate:  102 PR Interval:  152 QRS Duration: 94 QT Interval:  356 QTC Calculation: 463 R Axis:   -4 Text Interpretation:  Sinus tachycardia Biatrial enlargement Left ventricular hypertrophy with repolarization abnormality Abnormal ECG Similar to previous Confirmed by Mingo Amber  MD, [place] ([id number]) on 05/05/2013 3:07:36 PM            MDM   Final diagnoses:  Hypertensive urgency  Chest pain  Headache  Cardiomegaly  CHF exacerbation   [name] presents with multiple symptoms for approx 1 week.  He is using Goody powder 4 packets per day and now with abd pain and flank pain.  Concern for hypertensive urgency.  Pt given hydralazine IV and usual home medications.    4:22 PM External hemorrhoids on exam  with small amount of BRB on rectal exam.  Rectal vault empty.    4:43 PM Increased BNP with low-grade CHF with mild interstitial edema. There has been an increase in the cardiac  silhouette size since the previous study.  I personally reviewed the imaging tests through PACS system.  I reviewed available ER/hospitalization records through the EMR.   Increased serum creatinine, but below pts previous baseline.    UA without evidence of urinary tract infection, UTI was positive for marijuana, troponin negative.  6:18 PM Discussed with family medicine who will admit.  ON repeat exam, pt with benign abd.  No persistent abd pain or CVA tenderness.  UA without evidence of UTI and no evidence of prostatitis on exam.    The patient was discussed with and seen by Dr. Ashok Cordia who agrees with the treatment plan.    Abigail Butts, PA-C 05/05/13 1946

## 2013-05-06 ENCOUNTER — Observation Stay (HOSPITAL_COMMUNITY): Payer: Medicare Other

## 2013-05-06 DIAGNOSIS — R0602 Shortness of breath: Secondary | ICD-10-CM | POA: Diagnosis not present

## 2013-05-06 DIAGNOSIS — I10 Essential (primary) hypertension: Secondary | ICD-10-CM | POA: Diagnosis not present

## 2013-05-06 DIAGNOSIS — I359 Nonrheumatic aortic valve disorder, unspecified: Secondary | ICD-10-CM

## 2013-05-06 DIAGNOSIS — I119 Hypertensive heart disease without heart failure: Secondary | ICD-10-CM

## 2013-05-06 DIAGNOSIS — I509 Heart failure, unspecified: Secondary | ICD-10-CM | POA: Diagnosis not present

## 2013-05-06 DIAGNOSIS — R51 Headache: Secondary | ICD-10-CM | POA: Diagnosis not present

## 2013-05-06 DIAGNOSIS — F17200 Nicotine dependence, unspecified, uncomplicated: Secondary | ICD-10-CM

## 2013-05-06 DIAGNOSIS — R109 Unspecified abdominal pain: Secondary | ICD-10-CM | POA: Diagnosis not present

## 2013-05-06 LAB — COMPREHENSIVE METABOLIC PANEL
ALK PHOS: 95 U/L (ref 39–117)
ALT: 10 U/L (ref 0–53)
AST: 14 U/L (ref 0–37)
Albumin: 3.3 g/dL — ABNORMAL LOW (ref 3.5–5.2)
BILIRUBIN TOTAL: 0.7 mg/dL (ref 0.3–1.2)
BUN: 13 mg/dL (ref 6–23)
CHLORIDE: 99 meq/L (ref 96–112)
CO2: 24 mEq/L (ref 19–32)
CREATININE: 1.52 mg/dL — AB (ref 0.50–1.35)
Calcium: 9.1 mg/dL (ref 8.4–10.5)
GFR calc non Af Amer: 56 mL/min — ABNORMAL LOW (ref >90)
GFR, EST AFRICAN AMERICAN: 65 mL/min — AB (ref >90)
GLUCOSE: 88 mg/dL (ref 70–99)
POTASSIUM: 3.8 meq/L (ref 3.7–5.3)
Sodium: 137 mEq/L (ref 137–147)
Total Protein: 7 g/dL (ref 6.0–8.3)

## 2013-05-06 LAB — TROPONIN I
Troponin I: <0.3 ng/mL (ref <0.30)
Troponin I: <0.3 ng/mL (ref <0.30)

## 2013-05-06 IMAGING — CT CT ANGIO CHEST
2 of 7 series · 18 of 36 positions shown · IV contrast (omnipaque)
Comparison: None.

CLINICAL DATA: Chest pain, short of breath, elevated blood
pressure for 2 weeks.

CT ANGIOGRAPHY CHEST WITH CONTRAST
TECHNIQUE: Multidetector CT imaging of the chest was performed
using the standard protocol during bolus administration of
intravenous contrast.  Multiplanar CT image reconstructions
including MIPs were obtained to evaluate the vascular anatomy.
Contrast:  100 ml Omnipaque 300

[Series 5: pe thins · axial · 0.68mm/px · z∈[-305,-32]mm · 17 of 309 slices shown]
[im 18/309  lung]
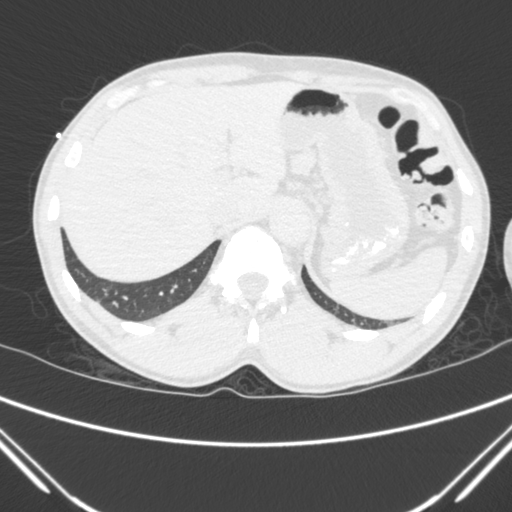
[im 35/309  mediastinal]
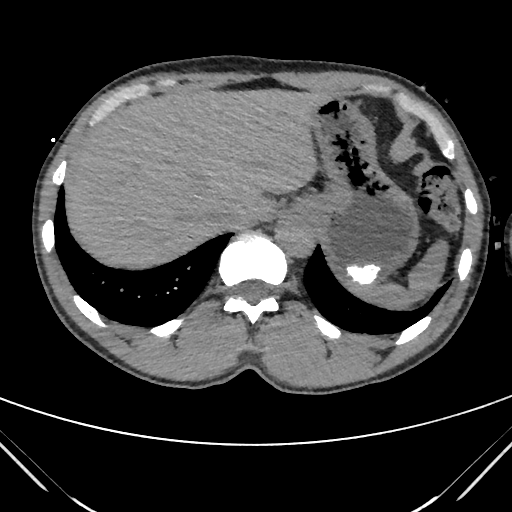
[im 52/309  lung]
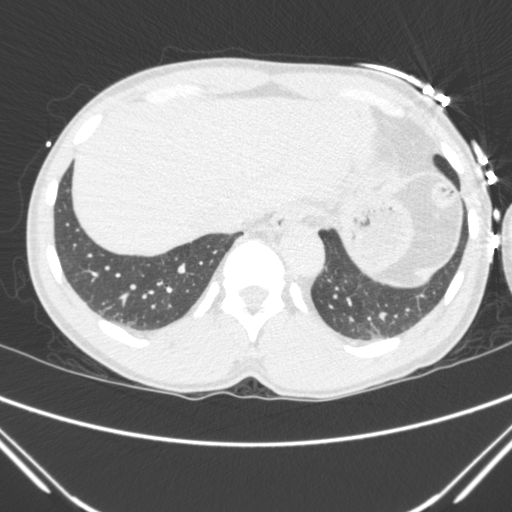
[im 69/309  mediastinal]
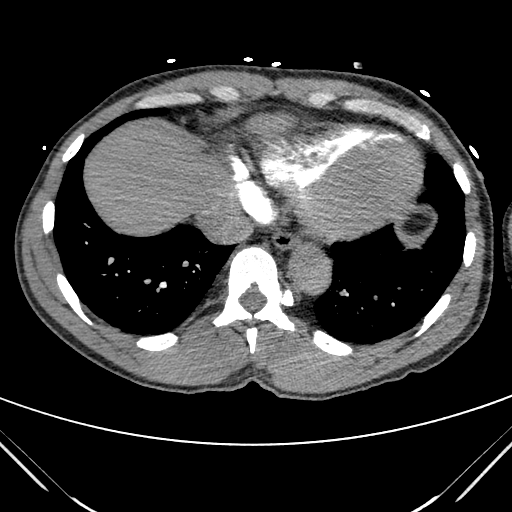
[im 86/309  lung]
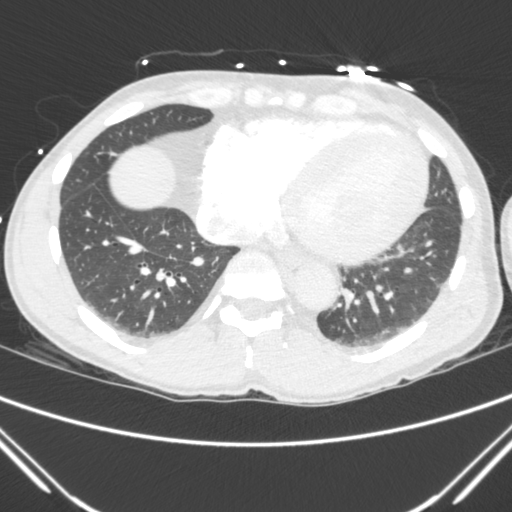
[im 103/309  mediastinal]
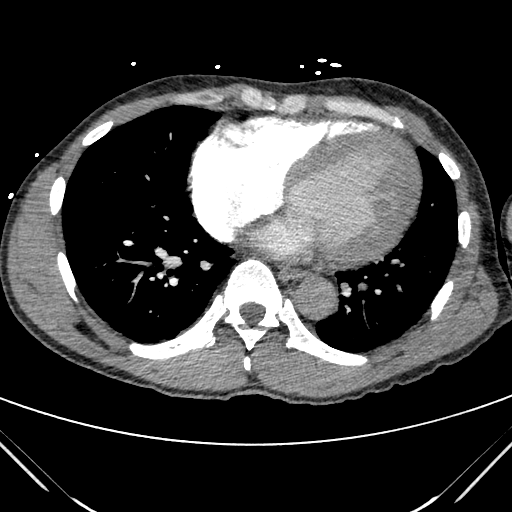
[im 120/309  lung]
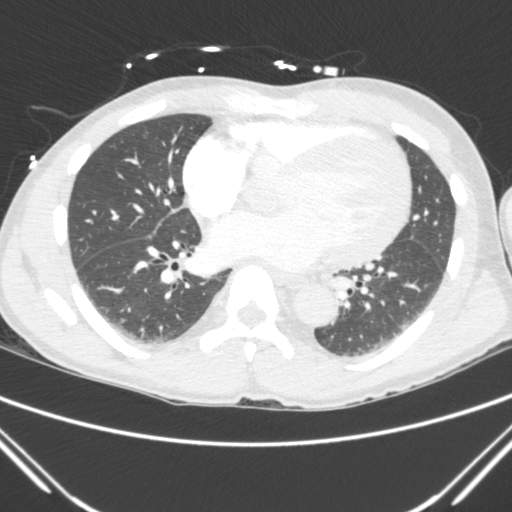
[im 137/309  mediastinal]
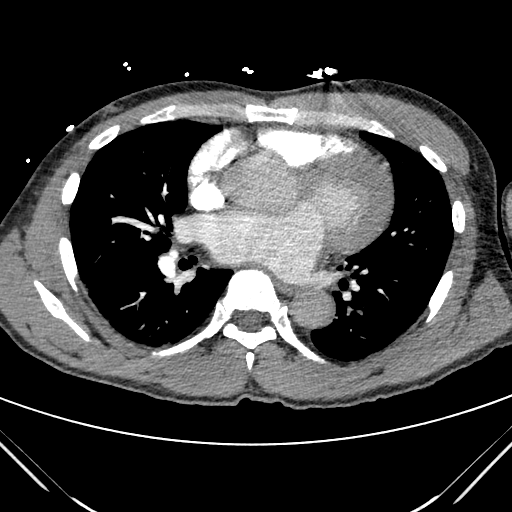
[im 155/309  lung]
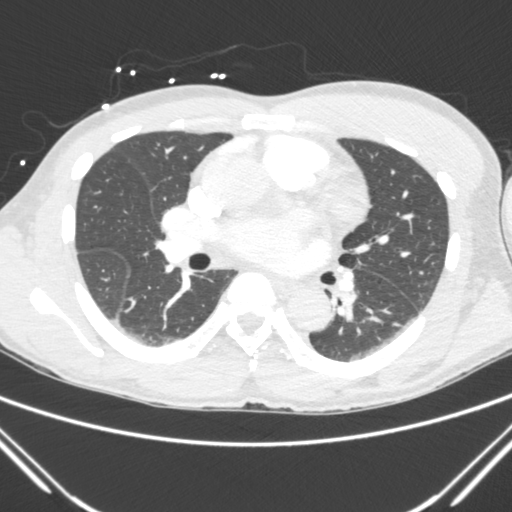
[im 172/309  mediastinal]
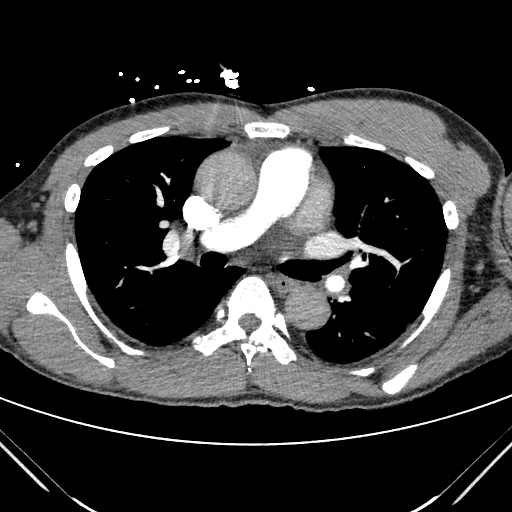
[im 189/309  lung]
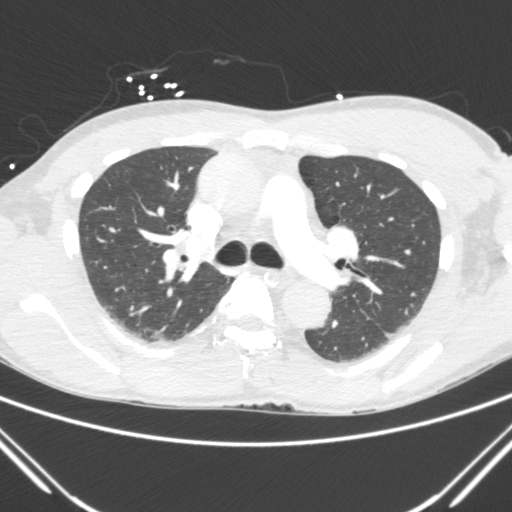
[im 206/309  mediastinal]
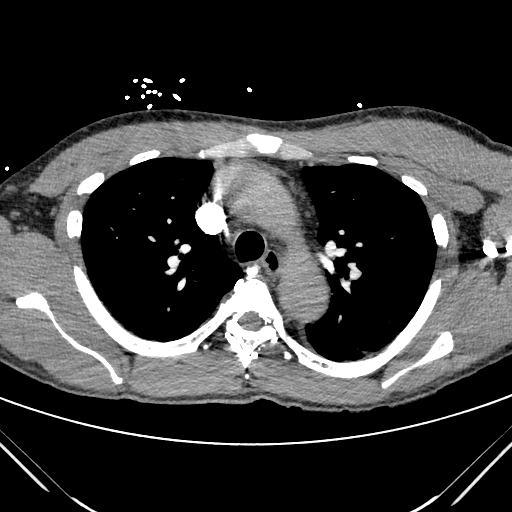
[im 223/309  lung]
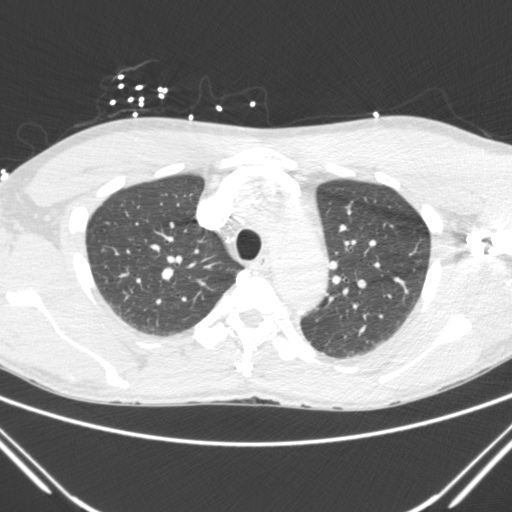
[im 240/309  mediastinal]
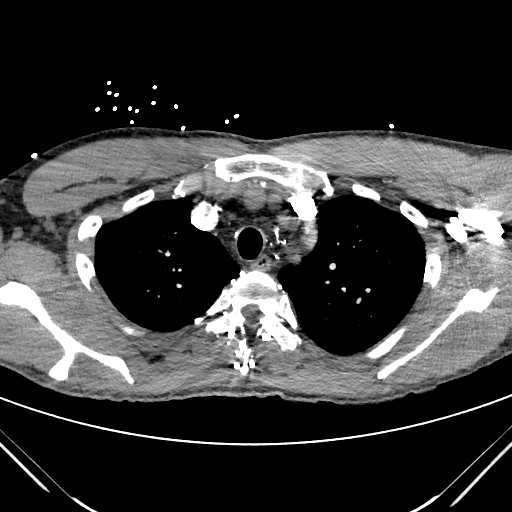
[im 257/309  lung]
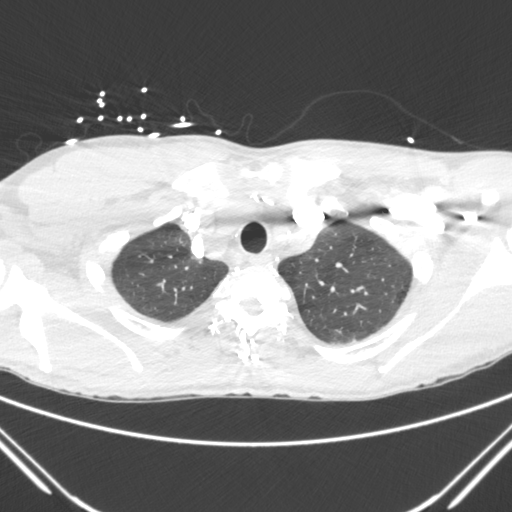
[im 274/309  mediastinal]
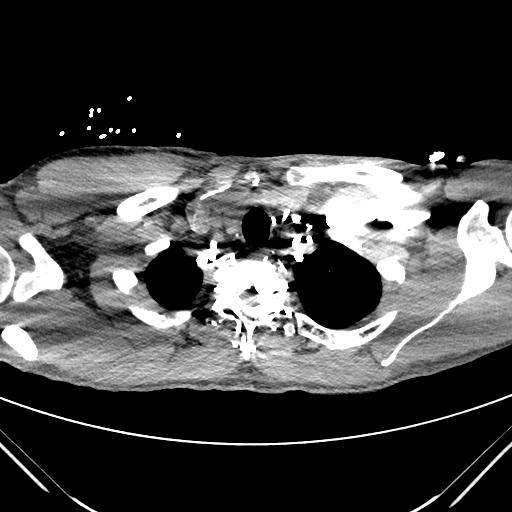
[im 291/309  lung]
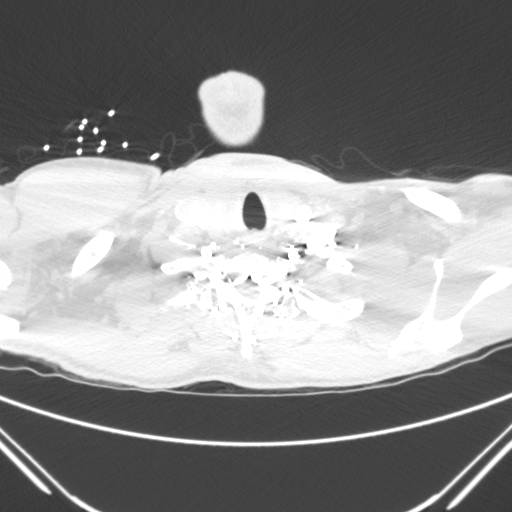

[mpr, coronals, coronal · coronal · 0.68mm/px · 1 of 114 slices shown]
[im 57/114  mediastinal]
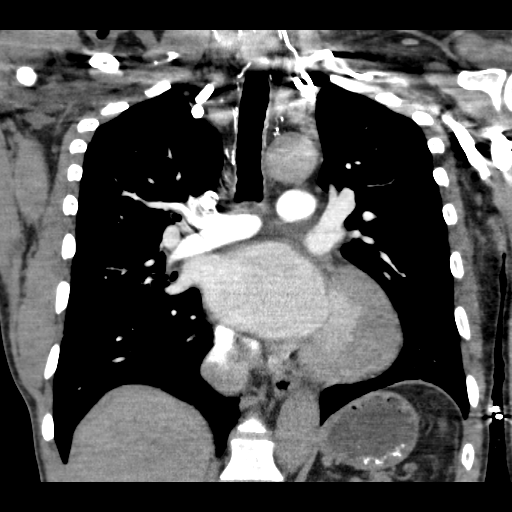

[18 of 36 positions shown; findings below may reference images not displayed]

FINDINGS: Technically adequate study with moderately good
opacification of the central and segmental pulmonary arteries.  No
definite filling defects are demonstrated.  No evidence of
significant pulmonary embolus.  No pleural effusions.  Normal
caliber thoracic aorta.  No significant lymphadenopathy in the
chest.  Dependent atelectasis in the lungs.  Scattered
emphysematous changes and / or hematoceles.  Mild central bronchial
wall thickening suggesting chronic bronchitic changes.  No focal
airspace consolidation in the lungs.  Normal alignment of the
thoracic vertebra without compression deformity.

Review of the MIP images confirms the above findings.
IMPRESSION: No evidence of significant pulmonary embolus.

## 2013-05-06 IMAGING — CR DG CHEST 2V
2 series · 2 of 2 positions shown · non-contrast
Comparison: 08/20/2009

CLINICAL DATA: Chest pain, shortness of breath.

CHEST - 2 VIEW

[w chest pa]
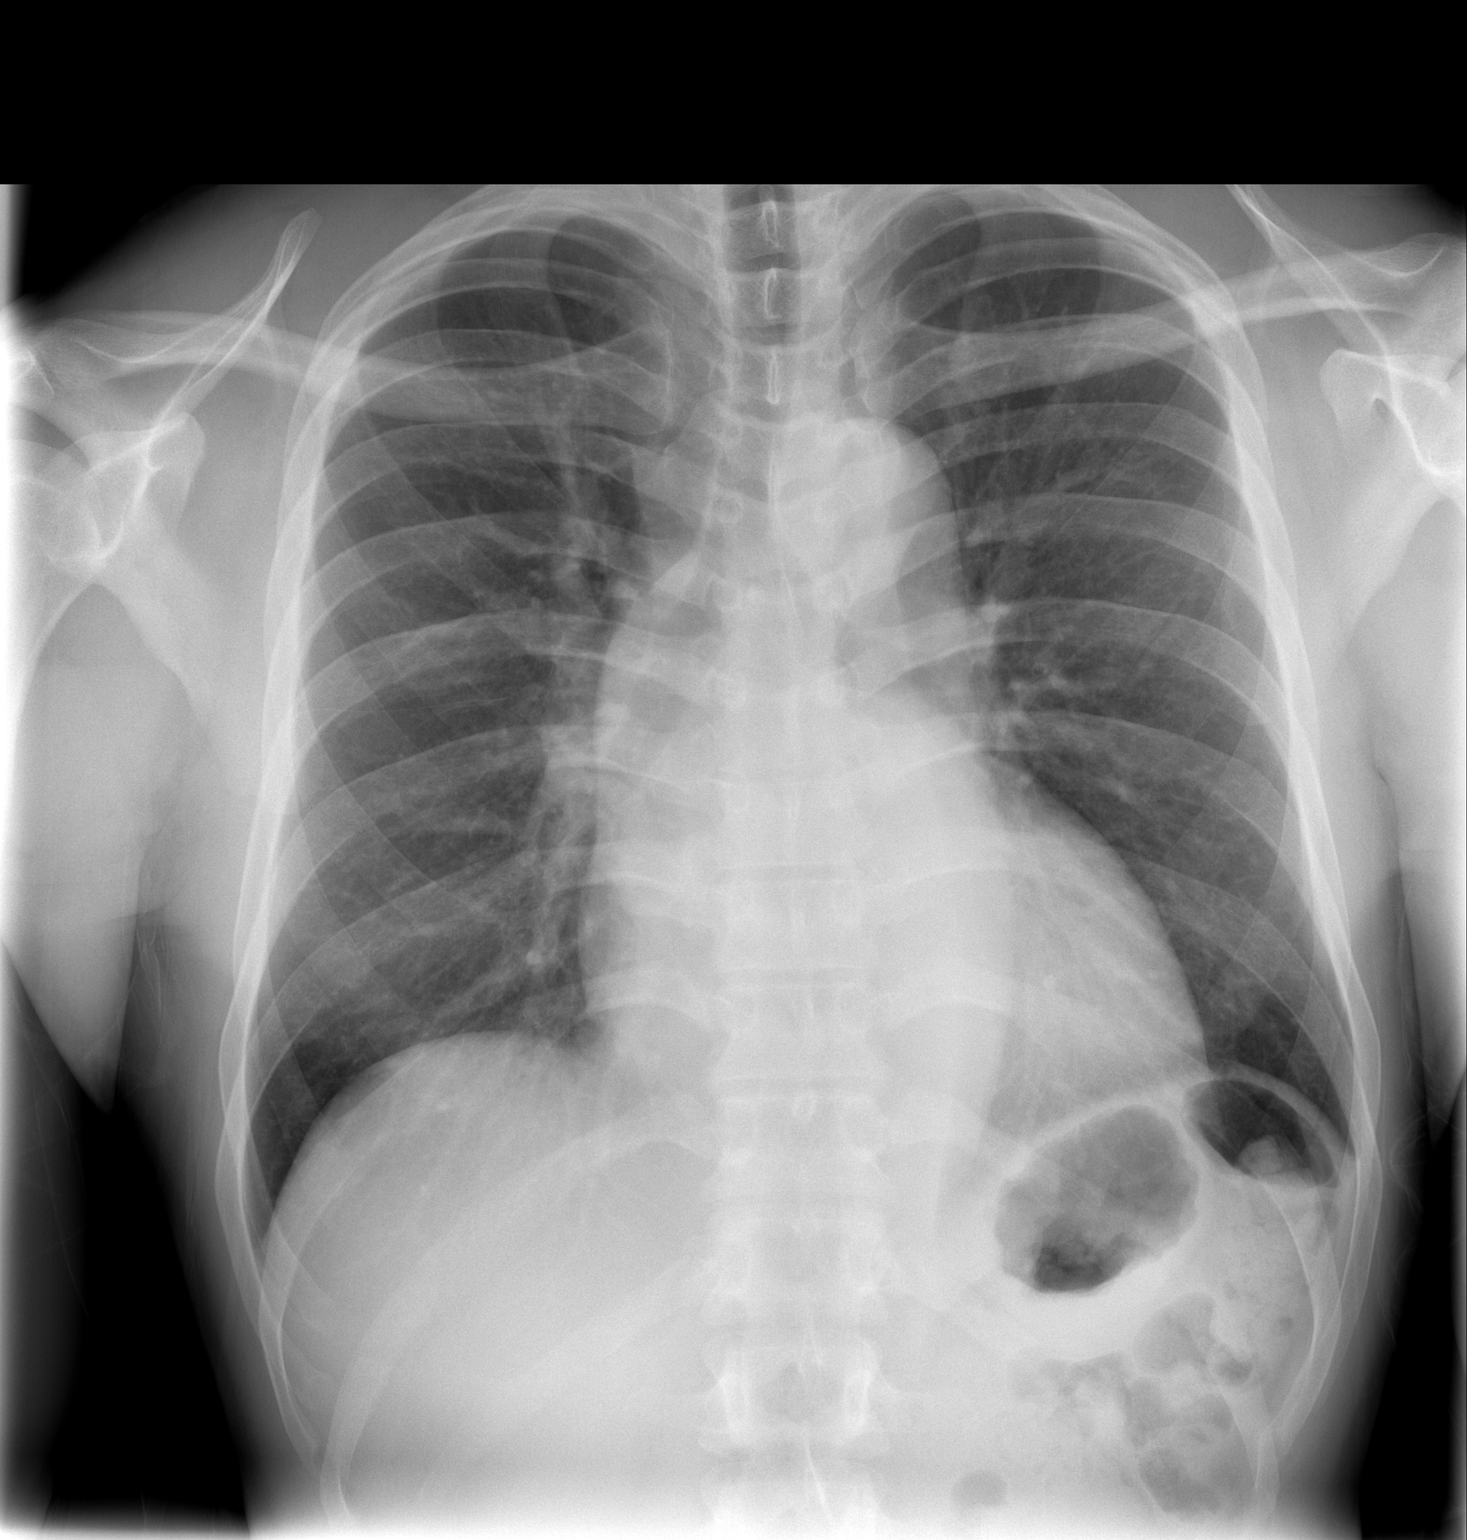

[w chest lat]
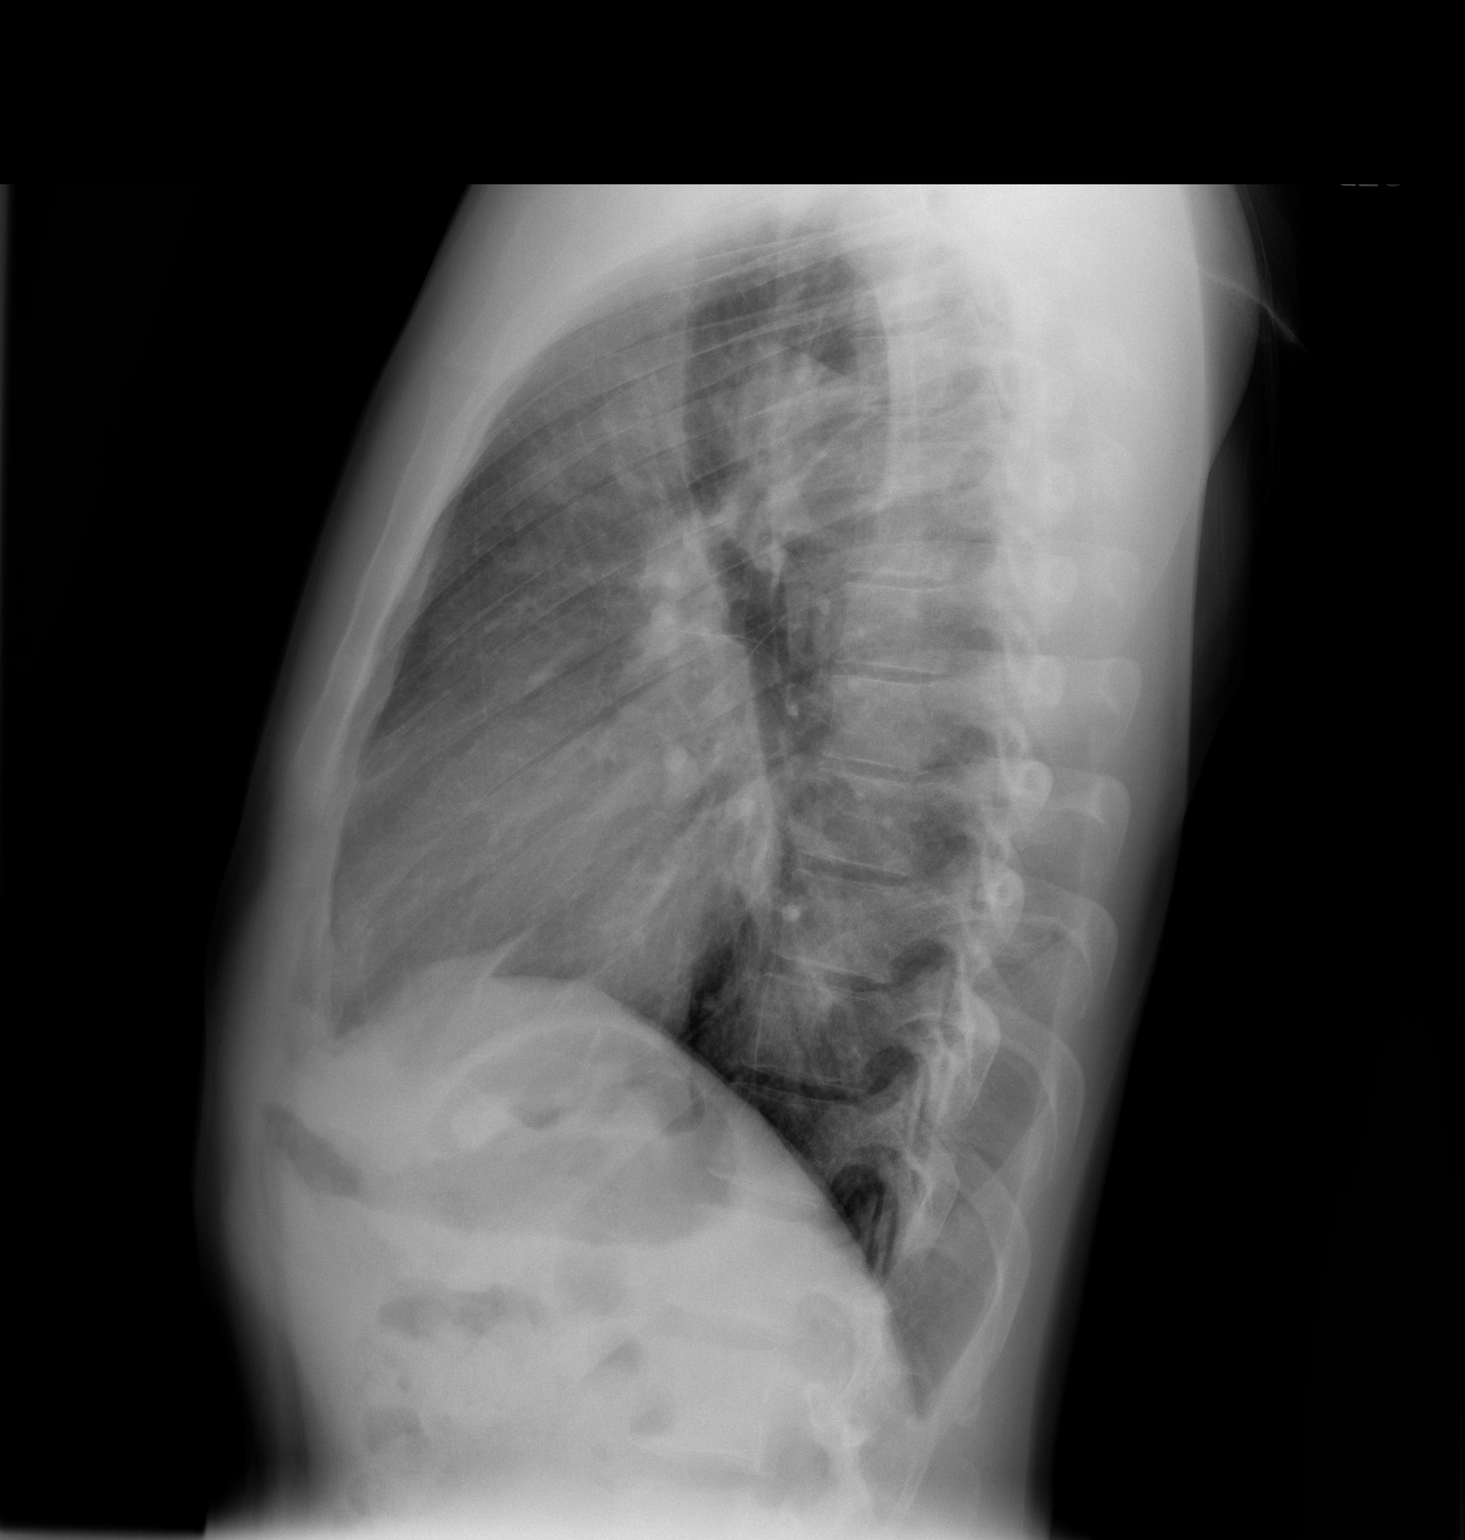

[2 of 2 positions shown; findings below may reference images not displayed]

FINDINGS: Trachea is midline.  Heart size stable.  Ascending aorta
is mildly prominent, as before.  Lungs are clear.  No pleural
fluid.
IMPRESSION: No acute findings.

## 2013-05-06 MED ORDER — METOPROLOL TARTRATE 12.5 MG HALF TABLET
12.5000 mg | ORAL_TABLET | Freq: Two times a day (BID) | ORAL | Status: DC
  Filled 2013-05-06 (×2): qty 1

## 2013-05-06 MED ORDER — HYDRALAZINE HCL 25 MG PO TABS: 25.0000 mg | ORAL_TABLET | Freq: Three times a day (TID) | ORAL | Status: DC

## 2013-05-06 MED ORDER — ASPIRIN 81 MG PO TBEC: 81.0000 mg | DELAYED_RELEASE_TABLET | Freq: Every day | ORAL | Status: DC

## 2013-05-06 MED ORDER — AMLODIPINE BESYLATE 10 MG PO TABS: 10.0000 mg | ORAL_TABLET | Freq: Every day | ORAL | Status: DC

## 2013-05-06 MED ORDER — CARVEDILOL 25 MG PO TABS: 25.0000 mg | ORAL_TABLET | Freq: Two times a day (BID) | ORAL | Status: DC

## 2013-05-06 MED ORDER — DICLOFENAC SODIUM 1 % TD GEL: 2.0000 g | Freq: Four times a day (QID) | TRANSDERMAL | Status: DC

## 2013-05-06 MED ORDER — SPIRONOLACTONE 25 MG PO TABS
25.0000 mg | ORAL_TABLET | Freq: Every day | ORAL | Status: DC
  Administered 2013-05-06: 25 mg via ORAL
  Filled 2013-05-06: qty 1

## 2013-05-06 MED ORDER — BISACODYL 10 MG RE SUPP
10.0000 mg | Freq: Once | RECTAL | Status: AC
  Administered 2013-05-06: 10 mg via RECTAL
  Filled 2013-05-06: qty 1

## 2013-05-06 MED ORDER — CYCLOBENZAPRINE HCL 5 MG PO TABS: 5.0000 mg | ORAL_TABLET | Freq: Three times a day (TID) | ORAL | Status: DC | PRN

## 2013-05-06 MED ORDER — FUROSEMIDE 20 MG PO TABS: 20.0000 mg | ORAL_TABLET | Freq: Every day | ORAL | Status: DC | PRN

## 2013-05-06 MED ORDER — LISINOPRIL 20 MG PO TABS: 20.0000 mg | ORAL_TABLET | Freq: Every day | ORAL | Status: DC

## 2013-05-06 MED ORDER — NICOTINE 14 MG/24HR TD PT24
14.0000 mg | MEDICATED_PATCH | Freq: Every day | TRANSDERMAL | Status: DC
  Administered 2013-05-06: 14 mg via TRANSDERMAL
  Filled 2013-05-06: qty 1

## 2013-05-06 MED ORDER — DEXAMETHASONE SODIUM PHOSPHATE 10 MG/ML IJ SOLN
10.0000 mg | Freq: Once | INTRAMUSCULAR | Status: AC
  Administered 2013-05-06: 10 mg via INTRAVENOUS
  Filled 2013-05-06: qty 1

## 2013-05-06 MED ORDER — ASPIRIN-ACETAMINOPHEN-CAFFEINE 250-250-65 MG PO TABS
2.0000 | ORAL_TABLET | Freq: Three times a day (TID) | ORAL | Status: DC | PRN
  Administered 2013-05-06: 2 via ORAL
  Filled 2013-05-06 (×2): qty 2

## 2013-05-06 MED ORDER — METOPROLOL TARTRATE 12.5 MG HALF TABLET: 12.5000 mg | ORAL_TABLET | Freq: Two times a day (BID) | ORAL | Status: DC

## 2013-05-06 MED ORDER — CARVEDILOL 25 MG PO TABS
25.0000 mg | ORAL_TABLET | Freq: Two times a day (BID) | ORAL | Status: DC
  Administered 2013-05-06: 25 mg via ORAL
  Filled 2013-05-06 (×2): qty 1

## 2013-05-06 NOTE — ED Provider Notes (Signed)
Medical screening examination/treatment/procedure(s) were conducted as a shared visit with non-physician practitioner(s) and myself.  I personally evaluated the patient during the encounter.    Pt w intermittent headaches, dull, frontal, gradual onset. Also c/o mild sob, edema. Pt w uncontrolled, severe htn, non compliant w meds (hasnt taken any of his normal meds yet today).  Pt given his hydralazine iv, other meds po. bp mildly improved. Labs. Admit.   Mirna Mires, MD 05/06/13 [phone]

## 2013-05-06 NOTE — Discharge Instructions (Addendum)
Your symptoms are likely secondary to your extreme blood pressure Please make sure you take your blood pressure medications as instructed and use Zofran if you become very nauseaus Please follow up with your regular doctor sometime next week or with the cardiologist.  Please keep a journal of your symptoms when you have a headache and what relieves your headache.

## 2013-05-06 NOTE — Progress Notes (Signed)
Family Medicine Teaching Service Daily Progress Note Intern Pager: [phone]  Patient name: [name] Medical record number: [id number] Date of birth: 1970-12-12 Age: 44 y.o. Gender: male  Primary Care Provider: [name], MD Consultants: None Code Status: Full  Pt Overview and Major Events to Date:  2/20 Admit in CP and SOB and HA   Assessment and Plan: [name] is a 43 y.o. male presenting with SOB and CP concerning for CHF exacerbation and concern for MI due to hypertensive emergency. PMH is significant for HTN, mild MR and mod AI w/ sCHF EF to 45-50%, CKD, ETOH/Drug/Tobacco use, HLD, HAs, hemorrhoids.   CHF: Possible exacerbation likely resolved EF 45-50% w/ mild MR and Mod AI. Likely in exacerbation (diastolic and systolic) given BNP of 99991111 and need to sleep sitting up. Cardiomegaly on CXR w/o pulm congestion. No O2 requirement. Likely secondary to uncontrolled HTN and MR/AI.  - Admit to tele  - BP control as below  - Lasix 20 PO BID (diuretic naive) pt likely to need diuretic at home for PRN symptoms - strict I/O and daily wt   HTN emergency: Resolved. On admission pt had hypertensive emergency given pressures as high as 212/142 w/ CP and HA. Of note pt reported this am that he was unable to take his medications for 4 days prior to admission secondary to emesis. 2/3 Trop neg - Tele as above  - Continue home Norvasc, coreg, hydralazine, and lisinopril  - lasix as above  - cont clonidine 0.1 BID   - monitor closely for hypotension after starting meds.  - start ASA81   Neck shoulder and back pain: Resolving after good nights rest and flexeril. likely all MSK related due to recent change in sleeping habits (now upright in chair), and poor sleep for days.  - Start Flexeril  - Voltaren gel (systemic NSAIDs not an option due to CKD)  - PT   HA: Pt concerned for brain cancer as his friend Dx w/ brain tumor and his only symptoms was HA. States HA present when HTN and when  nml BP.  Chronic condition for pt. Previous CT scan < 2 years ago was nml.  Likely exacerbated by HTN, recent lack of sleep and sleeping when able in upright position, and medication overuse/withdrawel  - Monitor for signs of ICP or meningitis.  - Tylenol PRN - Excedrin PRN - Decadron 10mg  x1 - CT head today to R/O ICP  ABD pain: No BM yet during admission. likely secondary to constipation as only having 1-2 small hard BMs wkly. Hemocult + likely from actively bleeding internal hemorrhoid as noted by the ED physician. Stool felt on palpation. No WBC.  - cont miralax and Senna BID scheduled  - Ducolax suppository today - consider Enema if needed  - AXR if worsens   CKD II: appears to be at baseline. Slight elevation in Cr on 2/21 likely secondary to diuresis. GFR calculated to be 67.  - BMET in am.  - avoid renally toxic medications if possible.   FEN/GI: heart healthy diet.   Prophylaxis:  SQ hep   Disposition: Pending improvement. liekly DC today  Subjective: Feeling much better today. Sleeping w/ bed to 26 degrees. Neck and shoulder pain nearly resolved. CP and SOB resolved. Still w/ HA and concerned for brain gowth.   Objective: Temp:  [98.4 F (36.9 C)-98.8 F (37.1 C)] 98.6 F (37 C) (02/21 0400) Pulse Rate:  [79-97] 79 (02/21 0400) Resp:  [15-33] 18 (02/21 0400)  BP: (144-220)/(99-149) 144/99 mmHg (02/21 0400) SpO2:  [96 %-98 %] 96 % (02/21 0400) Weight:  [159 lb 13.3 oz (72.5 kg)-161 lb 8 oz (73.256 kg)] 159 lb 13.3 oz (72.5 kg) (02/21 0400) Physical Exam: General: Thin male in mild distress sitting upright in bed  HEENT: mmm, EOMI  Cardiovascular: Diastolic decrescendo murmur, RRR, intact distal pulses, No JVD  Respiratory: CTAB, nml effort, no ronchi/crackles/consolidations  Extremities: warm well perfused. No edema of L or UE.  Skin: intact no rash  Neuro: CN2-12 grossly intact. Moves all extremities w/ purpose  MSK: neck and arm FROM     Laboratory: Results  for orders placed during the hospital encounter of 05/05/13 (from the past 24 hour(s))  CBC     Status: None   Collection Time    05/05/13  3:06 PM      Result Value Ref Range   WBC 7.1  4.0 - 10.5 K/uL   RBC 4.39  4.22 - 5.81 MIL/uL   Hemoglobin 14.3  13.0 - 17.0 g/dL   HCT 40.7  39.0 - 52.0 %   MCV 92.7  78.0 - 100.0 fL   MCH 32.6  26.0 - 34.0 pg   MCHC 35.1  30.0 - 36.0 g/dL   RDW 14.1  11.5 - 15.5 %   Platelets 152  150 - 400 K/uL  BASIC METABOLIC PANEL     Status: Abnormal   Collection Time    05/05/13  3:06 PM      Result Value Ref Range   Sodium 139  137 - 147 mEq/L   Potassium 4.2  3.7 - 5.3 mEq/L   Chloride 102  96 - 112 mEq/L   CO2 23  19 - 32 mEq/L   Glucose, Bld 92  70 - 99 mg/dL   BUN 14  6 - 23 mg/dL   Creatinine, Ser 1.48 (*) 0.50 - 1.35 mg/dL   Calcium 9.4  8.4 - 10.5 mg/dL   GFR calc non Af Amer 58 (*) >90 mL/min   GFR calc Af Amer 67 (*) >90 mL/min  PRO B NATRIURETIC PEPTIDE     Status: Abnormal   Collection Time    05/05/13  3:06 PM      Result Value Ref Range   Pro B Natriuretic peptide (BNP) 1915.0 (*) 0 - 125 pg/mL  I-STAT TROPOININ, ED     Status: None   Collection Time    05/05/13  3:33 PM      Result Value Ref Range   Troponin i, poc 0.02  0.00 - 0.08 ng/mL   Comment 3           ETHANOL     Status: None   Collection Time    05/05/13  3:57 PM      Result Value Ref Range   Alcohol, Ethyl (B) <11  0 - 11 mg/dL  POC OCCULT BLOOD, ED     Status: Abnormal   Collection Time    05/05/13  4:32 PM      Result Value Ref Range   Fecal Occult Bld POSITIVE (*) NEGATIVE  URINALYSIS, ROUTINE W REFLEX MICROSCOPIC     Status: Abnormal   Collection Time    05/05/13  4:55 PM      Result Value Ref Range   Color, Urine YELLOW  YELLOW   APPearance CLEAR  CLEAR   Specific Gravity, Urine 1.020  1.005 - 1.030   pH 6.0  5.0 - 8.0   Glucose, UA  NEGATIVE  NEGATIVE mg/dL   Hgb urine dipstick NEGATIVE  NEGATIVE   Bilirubin Urine NEGATIVE  NEGATIVE   Ketones, ur  15 (*) NEGATIVE mg/dL   Protein, ur >300 (*) NEGATIVE mg/dL   Urobilinogen, UA 0.2  0.0 - 1.0 mg/dL   Nitrite NEGATIVE  NEGATIVE   Leukocytes, UA NEGATIVE  NEGATIVE  URINE RAPID DRUG SCREEN (HOSP PERFORMED)     Status: Abnormal   Collection Time    05/05/13  4:55 PM      Result Value Ref Range   Opiates NONE DETECTED  NONE DETECTED   Cocaine NONE DETECTED  NONE DETECTED   Benzodiazepines NONE DETECTED  NONE DETECTED   Amphetamines NONE DETECTED  NONE DETECTED   Tetrahydrocannabinol POSITIVE (*) NONE DETECTED   Barbiturates NONE DETECTED  NONE DETECTED  URINE MICROSCOPIC-ADD ON     Status: None   Collection Time    05/05/13  4:55 PM      Result Value Ref Range   Squamous Epithelial / LPF RARE  RARE   WBC, UA 0-2  <3 WBC/hpf  TROPONIN I     Status: None   Collection Time    05/05/13  9:46 PM      Result Value Ref Range   Troponin I <0.30  <0.30 ng/mL  TROPONIN I     Status: None   Collection Time    05/06/13  3:43 AM      Result Value Ref Range   Troponin I <0.30  <0.30 ng/mL  COMPREHENSIVE METABOLIC PANEL     Status: Abnormal   Collection Time    05/06/13  3:43 AM      Result Value Ref Range   Sodium 137  137 - 147 mEq/L   Potassium 3.8  3.7 - 5.3 mEq/L   Chloride 99  96 - 112 mEq/L   CO2 24  19 - 32 mEq/L   Glucose, Bld 88  70 - 99 mg/dL   BUN 13  6 - 23 mg/dL   Creatinine, Ser 1.52 (*) 0.50 - 1.35 mg/dL   Calcium 9.1  8.4 - 10.5 mg/dL   Total Protein 7.0  6.0 - 8.3 g/dL   Albumin 3.3 (*) 3.5 - 5.2 g/dL   AST 14  0 - 37 U/L   ALT 10  0 - 53 U/L   Alkaline Phosphatase 95  39 - 117 U/L   Total Bilirubin 0.7  0.3 - 1.2 mg/dL   GFR calc non Af Amer 56 (*) >90 mL/min   GFR calc Af Amer 65 (*) >90 mL/min     Waldemar Dickens, MD 05/06/2013, 8:45 AM PGY-3, [place] Intern pager: [phone], text pages welcome

## 2013-05-06 NOTE — Progress Notes (Signed)
PT Cancellation Note  Patient Details Name: [name] MRN: [id number] DOB: November 16, 1970   Cancelled Treatment:    Reason Eval/Treat Not Completed: PT screened, no needs identified, will sign off.  Pt independent with all mobility.  Thanks.   INGOLD,Naja Apperson 05/06/2013, 8:50 AM  [facility] [phone] (pager)

## 2013-05-06 NOTE — H&P (Signed)
Attending Addendum  I examined the patient and discussed the assessment and plan with Dr. Marily Memos. I have reviewed the note and agree.  Briefly, 43 yo M with multiple medical problems/concerns including CHF, HTN, smoking, HAs x 4 years admitted for CP and SOB in the setting of uncontrolled HTN.   Patient alludes to issues getting along  with most recent PCP and that he does not plan to f/u there. He reports that he utilizes urgent care as his PCP. We discussed that given his known chronic diseases, consistent f/u with one PCP is necessary.   Homes BP meds: Coreg 25 once daily Hydralazine 25 mg TID norvasc 10 daily  Lisinopril 20 daily  Lasix 20 mg prn    Patient reports persistent yet improved SOB. Denies CP and neck pain now. Still has HA ( concerned about mass given his aunts husband x 3 years recently dx with 2 brain masses after HA x 3 years). Patient denies nausea or  Vision changes. He reports he has HA even when BP well controlled and he is compliant with medications.   BP 163/118  Pulse 76  Temp(Src) 98.6 F (37 C) (Oral)  Resp 18  Ht 6' (1.829 m)  Wt 159 lb 13.3 oz (72.5 kg)  BMI 21.67 kg/m2  SpO2 96% General appearance: alert, cooperative and no distress Neck: supple, symmetrical, trachea midline  Back: negative Lungs: clear to auscultation bilaterally Heart: regular rate and rhythm, S1, S2 normal, no murmur, click, rub or gallop Extremities: extremities normal, atraumatic, no cyanosis or edema   Reviewed labs and studies.  Trop neg x 3.   A&P:  1. CP and SOB with HTN: consistent with HTN urgency. Suspect worsening of CHF. Patient with improved symptoms with treatment of HTN. No O2 requirement. Medications: patient's coreg should be BID, not once daily. Will change to BID. Consider cardioselective BB, metoprolol if patient develops symptoms of bronchospasm.  Goal weight is unclear. Patient is not significantly fluid overloaded on exam.   Will add aldactone,  patient with low/normal potassium in the past.   Will not continue clonidine chronically given hx of intermittent compliance and there are better options.   Nicotine patch.  2. HAs: suspect chronic HA from HTN much more likely that intracranial mass. Will obtain CT as concern for mass is obviously a large source of stress for the patient for reassurance.   Dispo: to home today or tomorrow pending continued improvement and tolerance of medications changes.   The most important this is f/u.   Patient will need to f/u at the [facility] clinic for post hospitalization f/u. I suspect he will be a challenge but I believe we can offer him good care and continuity.  He also needs f/u [place] cards. It does not appear that he has had any outpatient f/u despite scheduled appts.     Boykin Nearing, [place]

## 2013-05-06 NOTE — Progress Notes (Signed)
Attending Addendum  I examined the patient and discussed the assessment and plan with Dr. Marily Memos. I have reviewed the note and agree.  Please see my addendum to the H&P for today's assessment and plan.     [name], [place]

## 2013-05-07 NOTE — Discharge Summary (Signed)
Attending Addendum  I examined the patient and discussed the discharge plan with Dr. Merrell. I have reviewed the note and agree.    Galileah Piggee, MD FAMILY MEDICINE TEACHING SERVICE   

## 2013-05-07 NOTE — Discharge Summary (Signed)
[facility] Discharge Summary  Patient name: [name] Medical record number: [id number] Date of birth: [birth year] Age: 43 y.o. Gender: male Date of Admission: 05/05/2013  Date of Discharge: 05/07/13 Admitting Physician: Minerva Ends, MD  Primary Care Provider: [name], MD Consultants: None  Indication for Hospitalization: CP, CHF exacerbation, abd pain  Discharge Diagnoses/Problem List:  HTN emergency MSK pain HA Constipation CKD II THC and Tobacco use Hemorrhoids HLD  Disposition: Discharge  Discharge Condition: Stable and excellent  Discharge Exam:  General: Thin male in mild distress sitting upright in bed  HEENT: mmm, EOMI  Cardiovascular: Diastolic decrescendo murmur, RRR, intact distal pulses, No JVD  Respiratory: CTAB, nml effort, no ronchi/crackles/consolidations  Extremities: warm well perfused. No edema of L or UE.  Skin: intact no rash  Neuro: CN2-12 grossly intact. Moves all extremities w/ purpose  MSK: neck and arm FROM    Brief Hospital Course:  [name] is a 43 y.o. male presenting with SOB and CP concerning for CHF exacerbation and concern for MI due to hypertensive emergency. PMH is significant for HTN, mild MR and mod AI w/ sCHF EF to 45-50%, CKD, ETOH/Drug/Tobacco use, HLD, HAs, hemorrhoids.   CHF: Likely in exacerbation (diastolic and systolic) given BNP of 99991111 and need to sleep sitting up. Cardiomegaly on CXR w/o pulm congestion. No O2 requirement. Likely secondary to uncontrolled HTN and MR/AI. Symptoms much improved after Lasix 20 PO and BP control. No new Echo obtained as recent Echo showed EF 45-50% w/ mild MR and Mod AI. Pt discharged on lasix 20 PO PRN SOB and wt gain.   HTN emergency: On admission pt had hypertensive emergency given pressures as high as 212/142 w/ CP and HA. Of note pt reported this am that he was unable to take his medications for 4 days prior to admission secondary to emesis. 3/3  Trop neg. Tele w/o event. Pts home Norvasc, coreg, hydralazine, and lisinopril was given w/ control of BP. Initially clonidine 0.1BID given for additional acute BP suppression. Pt started on ASA81. Compliance is a major concern as at DC pt actually said he was out of his meds and needed refills (30 days provided).    Neck shoulder and back pain: Liekly secondary to MSK pain from sitting upright and lack of sleep for several days. Resolving after good nights rest and flexeril and voltaren gel.   HA: HAs longstanding issue for pt. Likely secondary to extreme HTN, recent lack of sleep and sleeping when able in upright position, and medication overuse/withdrawelto Pt very concerned for brain cancer as his friend Dx w/ brain tumor and his only symptoms was HA. No focal neurological deficits. Pt very beligerent to hospital staff during stay. NO signs of meningitis or ICP. Previous CT scan < 2 years ago was reviewed and nml.  Pt given excedrin, Decadron 10mg  w/ improvement. New CT w/o contrast of the head was obtained and was nml. Pt to f/u w/ PCP regarding further treatments.   # Constipation: Abd pain on admission Likely secondary to constipation as only having 1-2 small hard BMs wkly. Hemocult + likely from actively bleeding internal hemorrhoid as noted by the ED physician. Stool felt on Abd palpation. Pt started on MIralax 34g BID adn Senna 2 tab BID w/o much improvement. Ducolax suppository ordered but pt left prior to administration. .   # CKD II: At time of admission appeared to be at baseline. Slight elevation in Cr on 2/21 likely secondary to  diuresis. GFR calculated to be 67.   FEN/GI: heart healthy diet during admission  Prophylaxis: During admission pt given SQ hep   Issues for Follow Up:  Persistent headache Uncontrolled HTN CKDII w/ AKI Constipation  Significant Procedures: None  Significant Labs and Imaging:   Recent Labs Lab 05/05/13 1506  WBC 7.1  HGB 14.3  HCT 40.7  PLT 152     Recent Labs Lab 05/05/13 1506 05/06/13 0343  NA 139 137  K 4.2 3.8  CL 102 99  CO2 23 24  GLUCOSE 92 88  BUN 14 13  CREATININE 1.48* 1.52*  CALCIUM 9.4 9.1  ALKPHOS  --  95  AST  --  14  ALT  --  10  ALBUMIN  --  3.3*   Dg Chest 2 View  05/05/2013   CLINICAL DATA:  Chest and abdominal discomfort for 8 days with his and constipation and urinary frequency  EXAM: CHEST  2 VIEW  COMPARISON:  DG CHEST 2 VIEW dated 03/04/2013  FINDINGS: The cardiac silhouette remains mildly enlarged. There is tortuosity of the ascending and descending thoracic aorta. The lungs are adequately inflated. There is mild prominence of the central pulmonary vascularity which has developed since the previous study. The interstitial markings of both lungs also are slightly more conspicuous. There is no pleural effusion or alveolar infiltrate. The observed portions of the bony thorax exhibit no acute abnormalities.  IMPRESSION: The findings may reflect low-grade CHF with mild interstitial edema. There has been an increase in the cardiac silhouette size since the previous study.   Electronically Signed   By: Jeliyah Middlebrooks  [place]   On: 05/05/2013 16:11   Ct Head Wo Contrast  05/06/2013   CLINICAL DATA:  Severe headache  EXAM: CT HEAD WITHOUT CONTRAST  TECHNIQUE: Contiguous axial images were obtained from the base of the skull through the vertex without intravenous contrast.  COMPARISON:  11/13/2011  FINDINGS: Ventricle size is normal. Negative for acute infarct. Mild chronic changes in the periventricular frontal white matter bilaterally, similar to the prior study. Negative for hemorrhage or mass.  Mucosal edema in the paranasal sinuses. Prior medial antrostomy on the right. Chronic right medial orbital fracture is unchanged. No acute bony change.  IMPRESSION: Mild chronic microvascular ischemic change in the white matter. No acute intracranial abnormality.  Chronic sinusitis   Electronically Signed   By: Franchot Gallo M.D.    On: 05/06/2013 13:31    Results/Tests Pending at Time of Discharge: none  Discharge Medications:    Medication List    STOP taking these medications       ibuprofen 200 MG tablet  Commonly known as:  ADVIL,MOTRIN      TAKE these medications       amLODipine 10 MG tablet  Commonly known as:  NORVASC  Take 1 tablet (10 mg total) by mouth daily.     amLODipine 10 MG tablet  Commonly known as:  NORVASC  Take 10 mg by mouth daily.     aspirin 81 MG EC tablet  Take 1 tablet (81 mg total) by mouth daily.     carvedilol 25 MG tablet  Commonly known as:  COREG  Take 1 tablet (25 mg total) by mouth 2 (two) times daily.     cyclobenzaprine 5 MG tablet  Commonly known as:  FLEXERIL  Take 1 tablet (5 mg total) by mouth 3 (three) times daily as needed for muscle spasms (please give 1st dose now).  diclofenac sodium 1 % Gel  Commonly known as:  VOLTAREN  Apply 2 g topically 4 (four) times daily.     furosemide 20 MG tablet  Commonly known as:  LASIX  Take 1 tablet (20 mg total) by mouth daily as needed for fluid or edema (shortness of breath).     hydrALAZINE 25 MG tablet  Commonly known as:  APRESOLINE  Take 1 tablet (25 mg total) by mouth 3 (three) times daily.     hydrALAZINE 25 MG tablet  Commonly known as:  APRESOLINE  Take 25 mg by mouth 3 (three) times daily.     lisinopril 20 MG tablet  Commonly known as:  PRINIVIL,ZESTRIL  Take 1 tablet (20 mg total) by mouth daily.     lisinopril 20 MG tablet  Commonly known as:  PRINIVIL,ZESTRIL  Take 20 mg by mouth daily.     ondansetron 4 MG tablet  Commonly known as:  ZOFRAN  Take 1 tablet (4 mg total) by mouth every 6 (six) hours.        Discharge Instructions: Please refer to Patient Instructions section of EMR for full details.  Patient was counseled important signs and symptoms that should prompt return to medical care, changes in medications, dietary instructions, activity restrictions, and follow up  appointments.   Follow-Up Appointments:     Follow-up Information   Schedule an appointment as soon as possible for a visit with Brailee Riede, MD.   Specialty:  Family Medicine   Contact information:   [address] [place] [address] [phone]       Waldemar Dickens, MD 05/07/2013, 7:36 AM PGY-3 [place]

## 2013-06-22 ENCOUNTER — Encounter: Payer: Self-pay | Admitting: Cardiology

## 2013-07-21 ENCOUNTER — Emergency Department (HOSPITAL_COMMUNITY)
Admission: EM | Admit: 2013-07-21 | Discharge: 2013-07-21 | Disposition: A | Discharge status: Home / Self Care | Payer: Medicare Other | Attending: Emergency Medicine | Admitting: Emergency Medicine

## 2013-07-21 ENCOUNTER — Encounter (HOSPITAL_COMMUNITY): Payer: Self-pay | Admitting: Emergency Medicine

## 2013-07-21 DIAGNOSIS — F17200 Nicotine dependence, unspecified, uncomplicated: Secondary | ICD-10-CM | POA: Diagnosis not present

## 2013-07-21 DIAGNOSIS — L723 Sebaceous cyst: Secondary | ICD-10-CM | POA: Insufficient documentation

## 2013-07-21 DIAGNOSIS — Z7982 Long term (current) use of aspirin: Secondary | ICD-10-CM | POA: Diagnosis not present

## 2013-07-21 DIAGNOSIS — I129 Hypertensive chronic kidney disease with stage 1 through stage 4 chronic kidney disease, or unspecified chronic kidney disease: Secondary | ICD-10-CM | POA: Insufficient documentation

## 2013-07-21 DIAGNOSIS — Z8719 Personal history of other diseases of the digestive system: Secondary | ICD-10-CM | POA: Diagnosis not present

## 2013-07-21 DIAGNOSIS — I10 Essential (primary) hypertension: Secondary | ICD-10-CM | POA: Diagnosis not present

## 2013-07-21 DIAGNOSIS — I509 Heart failure, unspecified: Secondary | ICD-10-CM | POA: Insufficient documentation

## 2013-07-21 DIAGNOSIS — Z79899 Other long term (current) drug therapy: Secondary | ICD-10-CM | POA: Diagnosis not present

## 2013-07-21 DIAGNOSIS — Z791 Long term (current) use of non-steroidal anti-inflammatories (NSAID): Secondary | ICD-10-CM | POA: Insufficient documentation

## 2013-07-21 DIAGNOSIS — N189 Chronic kidney disease, unspecified: Secondary | ICD-10-CM | POA: Diagnosis not present

## 2013-07-21 DIAGNOSIS — L089 Local infection of the skin and subcutaneous tissue, unspecified: Secondary | ICD-10-CM

## 2013-07-21 DIAGNOSIS — F411 Generalized anxiety disorder: Secondary | ICD-10-CM | POA: Insufficient documentation

## 2013-07-21 DIAGNOSIS — E785 Hyperlipidemia, unspecified: Secondary | ICD-10-CM | POA: Insufficient documentation

## 2013-07-21 MED ORDER — OXYCODONE-ACETAMINOPHEN 5-325 MG PO TABS
1.0000 | ORAL_TABLET | Freq: Once | ORAL | Status: AC
  Administered 2013-07-21: 1 via ORAL
  Filled 2013-07-21: qty 1

## 2013-07-21 MED ORDER — TRAMADOL HCL 50 MG PO TABS: 50.0000 mg | ORAL_TABLET | Freq: Four times a day (QID) | ORAL | Status: DC | PRN

## 2013-07-21 NOTE — ED Notes (Signed)
Pa into numb area around ear and to drain ear abcess dressing applied

## 2013-07-21 NOTE — ED Notes (Signed)
Rt ear pain after popping a bump on ear 3-4 days ago now rt ear swollen and painful states pus came out

## 2013-07-21 NOTE — Discharge Instructions (Signed)
Epidermal Cyst An epidermal cyst is sometimes called a sebaceous cyst, epidermal inclusion cyst, or infundibular cyst. These cysts usually contain a substance that looks "pasty" or "cheesy" and may have a bad smell. This substance is a protein called keratin. Epidermal cysts are usually found on the face, neck, or trunk. They may also occur in the vaginal area or other parts of the genitalia of both men and women. Epidermal cysts are usually small, painless, slow-growing bumps or lumps that move freely under the skin. It is important not to try to pop them. This may cause an infection and lead to tenderness and swelling. CAUSES  Epidermal cysts may be caused by a deep penetrating injury to the skin or a plugged hair follicle, often associated with acne. SYMPTOMS  Epidermal cysts can become inflamed and cause:  Redness.  Tenderness.  Increased temperature of the skin over the bumps or lumps.  Grayish-white, bad smelling material that drains from the bump or lump. DIAGNOSIS  Epidermal cysts are easily diagnosed by your caregiver during an exam. Rarely, a tissue sample (biopsy) may be taken to rule out other conditions that may resemble epidermal cysts. TREATMENT   Epidermal cysts often get better and disappear on their own. They are rarely ever cancerous.  If a cyst becomes infected, it may become inflamed and tender. This may require opening and draining the cyst. Treatment with antibiotics may be necessary. When the infection is gone, the cyst may be removed with minor surgery.  Small, inflamed cysts can often be treated with antibiotics or by injecting steroid medicines.  Sometimes, epidermal cysts become large and bothersome. If this happens, surgical removal in your caregiver's office may be necessary. HOME CARE INSTRUCTIONS  Only take over-the-counter or prescription medicines as directed by your caregiver.  Take your antibiotics as directed. Finish them even if you start to feel  better. SEEK MEDICAL CARE IF:   Your cyst becomes tender, red, or swollen.  Your condition is not improving or is getting worse.  You have any other questions or concerns. MAKE SURE YOU:  Understand these instructions.  Will watch your condition.  Will get help right away if you are not doing well or get worse. Document Released: 02/01/2004 Document Revised: 05/25/2011 Document Reviewed: 09/08/2010 [facility] Patient Information 2014 [place], [place]. Abscess Care After An abscess (also called a boil or furuncle) is an infected area that contains a collection of pus. Signs and symptoms of an abscess include pain, tenderness, redness, or hardness, or you may feel a moveable soft area under your skin. An abscess can occur anywhere in the body. The infection may spread to surrounding tissues causing cellulitis. A cut (incision) by the surgeon was made over your abscess and the pus was drained out. Gauze may have been packed into the space to provide a drain that will allow the cavity to heal from the inside outwards. The boil may be painful for 5 to 7 days. Most people with a boil do not have high fevers. Your abscess, if seen early, may not have localized, and may not have been lanced. If not, another appointment may be required for this if it does not get better on its own or with medications. HOME CARE INSTRUCTIONS   Only take over-the-counter or prescription medicines for pain, discomfort, or fever as directed by your caregiver.  When you bathe, soak and then remove gauze or iodoform packs at least daily or as directed by your caregiver. You may then wash the wound gently  with mild soapy water. Repack with gauze or do as your caregiver directs. SEEK IMMEDIATE MEDICAL CARE IF:   You develop increased pain, swelling, redness, drainage, or bleeding in the wound site.  You develop signs of generalized infection including muscle aches, chills, fever, or a general ill feeling.  An oral  temperature above 102 F (38.9 C) develops, not controlled by medication. See your caregiver for a recheck if you develop any of the symptoms described above. If medications (antibiotics) were prescribed, take them as directed. Document Released: 09/18/2004 Document Revised: 05/25/2011 Document Reviewed: 05/16/2007 [facility] Patient Information 2014 [place].

## 2013-07-21 NOTE — ED Provider Notes (Signed)
CSN: [id number]     Arrival date & time 07/21/13  1039 History   First MD Initiated Contact with Patient 07/21/13 1043     This chart was scribed for non-physician practitioner, Margarita Mail, PA-C, working with Jasper Riling. Alvino Chapel, MD by Forrestine Him, ED Scribe. This patient was seen in room TR09C/TR09C and the patient's care was started at 11:22 AM.  Chief Complaint  Patient presents with  . Otalgia   HPI  HPI Comments: [name] is a 43 y.o. male with a PMHx of HTN, GERD, CKD, and CHF who presents to the Emergency Department complaining of R sided otalgia x 3-4 days that is progressively worsening. Pt states he popped a cyst about 4 days ago and noted a foul odor along with a whitish colored discharge from the area. Pt now reports swelling to the R ear lobe along with moderate, constant pain. He has not tried anything OTC or any home remedies for his symptoms. At this time he denies any fever, chills, nausea, or vomiting. He has no other pertinent medical history. No other concerns this visit.   Past Medical History  Diagnosis Date  . Hypertension     a. Hx of HTN urgency secondary to noncompliance. b. urinary metanephrine and catecholeamine levels normal 2013.  [name] Anxiety   . GERD (gastroesophageal reflux disease)   . Acute renal failure     June 2012 felt secondary to Toradol  . Chronic renal failure   . CHF (congestive heart failure)     a. 05/2011: Adm with pulm edema/HTN urgency, EF 35-40% with diffuse hypokinesis and moderate to severe mitral regurgitation. Cardiomyopathy likely due to uncontrolled HTN and ETOH abuse - cath deferred due to renal insufficiency (felt due to uncontrolled HTN). bJodie Echevaria MV 06/2011: EF 37% and no ischemia or infarction. c. EF 45-50% by echo 01/2012.  . Cardiomyopathy   . HYPERLIPIDEMIA   . INGUINAL HERNIA   . ETOH abuse     a. Reported to have quit 05/2011.  . Valvular heart disease     a. Echo 05/2011: moderate to severe eccentric MR and mild to  moderate AI with prolapsing left coronary cusp. b. Echo 01/2012: mild-mod AI, mild dilitation of aortic root, mild MR.  . CKD (chronic kidney disease)     a. 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  . Tobacco abuse    Past Surgical History  Procedure Laterality Date  . Hernia repair    . Ankle surgery      Fractures bilaterally   Family History  Problem Relation Age of Onset  . Hypertension    . Colon cancer Paternal Uncle    History  Substance Use Topics  . Smoking status: Current Some Day Smoker -- 0.25 packs/day for 20 years    Types: Cigarettes  . Smokeless tobacco: Never Used  . Alcohol Use: Yes     Comment: used to drink 1 pint of gin a week, trying to stop    Review of Systems  Constitutional: Negative for fever and chills.  HENT: Negative for congestion.   Eyes: Negative for redness.  Respiratory: Negative for cough.   Gastrointestinal: Negative for nausea and vomiting.  Skin: Positive for wound (Abscess to R pinna). Negative for rash.  Neurological: Negative for headaches.  Psychiatric/Behavioral: Negative for confusion.      Allergies  Imdur  Home Medications   Prior to Admission medications   Medication Sig Start Date End Date Taking? Authorizing Provider  amLODipine (NORVASC) 10 MG tablet  Take 10 mg by mouth daily.    Historical Provider, MD  amLODipine (NORVASC) 10 MG tablet Take 1 tablet (10 mg total) by mouth daily. 05/06/13   Coral Spikes, DO  aspirin EC 81 MG EC tablet Take 1 tablet (81 mg total) by mouth daily. 05/06/13   Waldemar Dickens, MD  carvedilol (COREG) 25 MG tablet Take 1 tablet (25 mg total) by mouth 2 (two) times daily. 05/06/13   Coral Spikes, DO  cyclobenzaprine (FLEXERIL) 5 MG tablet Take 1 tablet (5 mg total) by mouth 3 (three) times daily as needed for muscle spasms (please give 1st dose now). 05/06/13   Waldemar Dickens, MD  diclofenac sodium (VOLTAREN) 1 % GEL Apply 2 g topically 4 (four) times daily. 05/06/13   Waldemar Dickens, MD  furosemide  (LASIX) 20 MG tablet Take 1 tablet (20 mg total) by mouth daily as needed for fluid or edema (shortness of breath). 05/06/13   Waldemar Dickens, MD  hydrALAZINE (APRESOLINE) 25 MG tablet Take 25 mg by mouth 3 (three) times daily.    Historical Provider, MD  hydrALAZINE (APRESOLINE) 25 MG tablet Take 1 tablet (25 mg total) by mouth 3 (three) times daily. 05/06/13   Coral Spikes, DO  lisinopril (PRINIVIL,ZESTRIL) 20 MG tablet Take 20 mg by mouth daily.    Historical Provider, MD  lisinopril (PRINIVIL,ZESTRIL) 20 MG tablet Take 1 tablet (20 mg total) by mouth daily. 05/06/13   Coral Spikes, DO  ondansetron (ZOFRAN) 4 MG tablet Take 1 tablet (4 mg total) by mouth every 6 (six) hours. 03/04/13   Glendell Docker, NP   Triage Vitals: BP 193/131  Pulse 92  Temp(Src) 98 F (36.7 C) (Oral)  Resp 12  SpO2 98%   Physical Exam  Nursing note and vitals reviewed. Constitutional: He is oriented to person, place, and time. He appears well-developed and well-nourished.  HENT:  Head: Normocephalic and atraumatic.  Eyes: EOM are normal.  Neck: Normal range of motion.  Cardiovascular: Normal rate.   Pulmonary/Chest: Effort normal.  Musculoskeletal: Normal range of motion.  Lymphadenopathy:       Head (right side): Preauricular and posterior auricular adenopathy present.       Head (left side): No preauricular and no posterior auricular adenopathy present.  Neurological: He is alert and oriented to person, place, and time.  Skin: Skin is warm and dry.  Large 4 cm in width tense abscess to the R pinna  Psychiatric: He has a normal mood and affect. His behavior is normal.    ED Course  NERVE BLOCK Date/Time: 07/23/2013 8:42 AM Performed by: Margarita Mail Authorized by: Margarita Mail Consent: Verbal consent obtained. Risks and benefits: risks, benefits and alternatives were discussed Consent given by: patient Indications: pain relief Indications comments: I&D Body area: head Nerve:  auricular Laterality: right Patient sedated: no Preparation: Patient was prepped and draped in the usual sterile fashion. Patient position: sitting Needle gauge: 75 G Location technique: anatomical landmarks Local anesthetic: lidocaine 2% without epinephrine Anesthetic total: 8 ml Outcome: pain improved Patient tolerance: Patient tolerated the procedure well with no immediate complications.   (including critical care time)  DIAGNOSTIC STUDIES: Oxygen Saturation is 98% on RA, Normal by my interpretation.    COORDINATION OF CARE: 11:22 AM- Will perform an I&D. Discussed treatment plan with pt at bedside and pt agreed to plan.     11:33 AM- INCISION AND DRAINAGE PROCEDURE NOTE: Patient identification was confirmed and verbal consent was  obtained. This procedure was performed by Margarita Mail, PA-C at 11:33 AM. Site: R pinna Sterile procedures observed Anesthetic used (type and amt): 8 ml of 2% Lidocaine without epinephrine Drainage: Copious  Complexity: Complex Packing used: None Site anesthetized, incision made over site, wound drained and explored loculations, rinsed with copious amounts of normal saline, wound packed with sterile gauze, covered with dry, sterile dressing.  Pt tolerated procedure well without complications.  Instructions for care discussed verbally and pt provided with additional written instructions for homecare and f/u.   Labs Review Labs Reviewed - No data to display  Imaging Review No results found.   EKG Interpretation None      MDM    Patient with infected sebaceous cyst of the R ear lobule. No surrounding. Cellulitis. Hemodynamically stable. Pain meds and supportive care. F/u with ENT or ED.   Final diagnoses:  Infected sebaceous cyst    I personally performed the services described in this documentation, which was scribed in my presence. The recorded information has been reviewed and is accurate.    Margarita Mail, PA-C 07/23/13  [phone]

## 2013-07-25 NOTE — ED Provider Notes (Signed)
Medical screening examination/treatment/procedure(s) were performed by non-physician practitioner and as supervising physician I was immediately available for consultation/collaboration.   EKG Interpretation None       Jasper Riling. Alvino Chapel, MD 07/25/13 [id number]

## 2013-08-08 ENCOUNTER — Inpatient Hospital Stay (HOSPITAL_COMMUNITY)
Admission: EM | Admit: 2013-08-08 | Discharge: 2013-08-09 | DRG: 683 | Discharge status: Against Medical Advice | Payer: Medicare Other | Attending: Internal Medicine | Admitting: Internal Medicine

## 2013-08-08 ENCOUNTER — Emergency Department (HOSPITAL_COMMUNITY): Payer: Medicare Other

## 2013-08-08 ENCOUNTER — Encounter (HOSPITAL_COMMUNITY): Payer: Self-pay | Admitting: *Deleted

## 2013-08-08 ENCOUNTER — Inpatient Hospital Stay (HOSPITAL_COMMUNITY): Payer: Medicare Other

## 2013-08-08 DIAGNOSIS — I160 Hypertensive urgency: Secondary | ICD-10-CM

## 2013-08-08 DIAGNOSIS — R05 Cough: Secondary | ICD-10-CM | POA: Diagnosis present

## 2013-08-08 DIAGNOSIS — E785 Hyperlipidemia, unspecified: Secondary | ICD-10-CM | POA: Diagnosis present

## 2013-08-08 DIAGNOSIS — I5042 Chronic combined systolic (congestive) and diastolic (congestive) heart failure: Secondary | ICD-10-CM | POA: Diagnosis present

## 2013-08-08 DIAGNOSIS — R059 Cough, unspecified: Secondary | ICD-10-CM | POA: Diagnosis present

## 2013-08-08 DIAGNOSIS — I169 Hypertensive crisis, unspecified: Secondary | ICD-10-CM | POA: Diagnosis present

## 2013-08-08 DIAGNOSIS — N179 Acute kidney failure, unspecified: Secondary | ICD-10-CM | POA: Diagnosis present

## 2013-08-08 DIAGNOSIS — R112 Nausea with vomiting, unspecified: Secondary | ICD-10-CM | POA: Diagnosis present

## 2013-08-08 DIAGNOSIS — I5031 Acute diastolic (congestive) heart failure: Secondary | ICD-10-CM

## 2013-08-08 DIAGNOSIS — F419 Anxiety disorder, unspecified: Secondary | ICD-10-CM

## 2013-08-08 DIAGNOSIS — F1210 Cannabis abuse, uncomplicated: Secondary | ICD-10-CM | POA: Diagnosis present

## 2013-08-08 DIAGNOSIS — B9689 Other specified bacterial agents as the cause of diseases classified elsewhere: Secondary | ICD-10-CM | POA: Diagnosis present

## 2013-08-08 DIAGNOSIS — F17200 Nicotine dependence, unspecified, uncomplicated: Secondary | ICD-10-CM | POA: Diagnosis present

## 2013-08-08 DIAGNOSIS — F341 Dysthymic disorder: Secondary | ICD-10-CM | POA: Diagnosis not present

## 2013-08-08 DIAGNOSIS — I119 Hypertensive heart disease without heart failure: Secondary | ICD-10-CM | POA: Diagnosis not present

## 2013-08-08 DIAGNOSIS — F329 Major depressive disorder, single episode, unspecified: Secondary | ICD-10-CM

## 2013-08-08 DIAGNOSIS — N183 Chronic kidney disease, stage 3 (moderate): Secondary | ICD-10-CM

## 2013-08-08 DIAGNOSIS — N182 Chronic kidney disease, stage 2 (mild): Secondary | ICD-10-CM | POA: Diagnosis present

## 2013-08-08 DIAGNOSIS — R930 Abnormal findings on diagnostic imaging of skull and head, not elsewhere classified: Secondary | ICD-10-CM | POA: Diagnosis not present

## 2013-08-08 DIAGNOSIS — I129 Hypertensive chronic kidney disease with stage 1 through stage 4 chronic kidney disease, or unspecified chronic kidney disease: Principal | ICD-10-CM | POA: Diagnosis present

## 2013-08-08 DIAGNOSIS — I509 Heart failure, unspecified: Secondary | ICD-10-CM | POA: Diagnosis present

## 2013-08-08 DIAGNOSIS — I059 Rheumatic mitral valve disease, unspecified: Secondary | ICD-10-CM | POA: Diagnosis present

## 2013-08-08 DIAGNOSIS — I10 Essential (primary) hypertension: Secondary | ICD-10-CM | POA: Diagnosis not present

## 2013-08-08 DIAGNOSIS — K219 Gastro-esophageal reflux disease without esophagitis: Secondary | ICD-10-CM | POA: Diagnosis present

## 2013-08-08 DIAGNOSIS — J329 Chronic sinusitis, unspecified: Secondary | ICD-10-CM | POA: Diagnosis present

## 2013-08-08 DIAGNOSIS — F32A Depression, unspecified: Secondary | ICD-10-CM

## 2013-08-08 DIAGNOSIS — Z800 Family history of malignant neoplasm of digestive organs: Secondary | ICD-10-CM

## 2013-08-08 DIAGNOSIS — Z888 Allergy status to other drugs, medicaments and biological substances status: Secondary | ICD-10-CM

## 2013-08-08 DIAGNOSIS — E876 Hypokalemia: Secondary | ICD-10-CM | POA: Diagnosis present

## 2013-08-08 DIAGNOSIS — F411 Generalized anxiety disorder: Secondary | ICD-10-CM | POA: Diagnosis present

## 2013-08-08 DIAGNOSIS — R1110 Vomiting, unspecified: Secondary | ICD-10-CM | POA: Diagnosis not present

## 2013-08-08 DIAGNOSIS — F1010 Alcohol abuse, uncomplicated: Secondary | ICD-10-CM | POA: Diagnosis present

## 2013-08-08 DIAGNOSIS — I428 Other cardiomyopathies: Secondary | ICD-10-CM | POA: Diagnosis present

## 2013-08-08 DIAGNOSIS — N189 Chronic kidney disease, unspecified: Secondary | ICD-10-CM

## 2013-08-08 HISTORY — DX: Headache: R51

## 2013-08-08 HISTORY — DX: Pneumonia, unspecified organism: J18.9

## 2013-08-08 LAB — RAPID URINE DRUG SCREEN, HOSP PERFORMED
Amphetamines: NOT DETECTED
BARBITURATES: NOT DETECTED
Benzodiazepines: NOT DETECTED
Cocaine: NOT DETECTED
Opiates: POSITIVE — AB
Tetrahydrocannabinol: POSITIVE — AB

## 2013-08-08 LAB — CBC
HCT: 40.3 % (ref 39.0–52.0)
Hemoglobin: 13.9 g/dL (ref 13.0–17.0)
MCH: 31.8 pg (ref 26.0–34.0)
MCHC: 34.5 g/dL (ref 30.0–36.0)
MCV: 92.2 fL (ref 78.0–100.0)
Platelets: 149 10*3/uL — ABNORMAL LOW (ref 150–400)
RBC: 4.37 MIL/uL (ref 4.22–5.81)
RDW: 14.3 % (ref 11.5–15.5)
WBC: 6.1 10*3/uL (ref 4.0–10.5)

## 2013-08-08 LAB — COMPREHENSIVE METABOLIC PANEL
ALT: 12 U/L (ref 0–53)
AST: 19 U/L (ref 0–37)
Albumin: 3.7 g/dL (ref 3.5–5.2)
Alkaline Phosphatase: 99 U/L (ref 39–117)
BILIRUBIN TOTAL: 0.6 mg/dL (ref 0.3–1.2)
BUN: 12 mg/dL (ref 6–23)
CHLORIDE: 103 meq/L (ref 96–112)
CO2: 23 meq/L (ref 19–32)
CREATININE: 1.53 mg/dL — AB (ref 0.50–1.35)
Calcium: 9.2 mg/dL (ref 8.4–10.5)
GFR calc Af Amer: 64 mL/min — ABNORMAL LOW (ref >90)
GFR, EST NON AFRICAN AMERICAN: 55 mL/min — AB (ref >90)
Glucose, Bld: 93 mg/dL (ref 70–99)
Potassium: 4 mEq/L (ref 3.7–5.3)
Sodium: 139 mEq/L (ref 137–147)
Total Protein: 7.6 g/dL (ref 6.0–8.3)

## 2013-08-08 LAB — GLUCOSE, CAPILLARY: Glucose-Capillary: 119 mg/dL — ABNORMAL HIGH (ref 70–99)

## 2013-08-08 LAB — PRO B NATRIURETIC PEPTIDE: Pro B Natriuretic peptide (BNP): 1031 pg/mL — ABNORMAL HIGH (ref 0–125)

## 2013-08-08 LAB — TROPONIN I: Troponin I: <0.3 ng/mL (ref <0.30)

## 2013-08-08 LAB — MRSA PCR SCREENING: MRSA by PCR: NEGATIVE

## 2013-08-08 MED ORDER — ACETAMINOPHEN 325 MG PO TABS: 650.0000 mg | ORAL_TABLET | Freq: Four times a day (QID) | ORAL | Status: DC | PRN

## 2013-08-08 MED ORDER — NITROGLYCERIN 2 % TD OINT: 1.0000 [in_us] | TOPICAL_OINTMENT | Freq: Four times a day (QID) | TRANSDERMAL | Status: DC

## 2013-08-08 MED ORDER — LORAZEPAM 2 MG/ML IJ SOLN
1.0000 mg | Freq: Once | INTRAMUSCULAR | Status: AC
  Administered 2013-08-08: 1 mg via INTRAVENOUS
  Filled 2013-08-08: qty 1

## 2013-08-08 MED ORDER — ACETAMINOPHEN 650 MG RE SUPP: 650.0000 mg | Freq: Four times a day (QID) | RECTAL | Status: DC | PRN

## 2013-08-08 MED ORDER — ONDANSETRON HCL 4 MG/2ML IJ SOLN
4.0000 mg | Freq: Four times a day (QID) | INTRAMUSCULAR | Status: DC | PRN
  Administered 2013-08-08: 4 mg via INTRAVENOUS
  Filled 2013-08-08: qty 2

## 2013-08-08 MED ORDER — MORPHINE SULFATE 2 MG/ML IJ SOLN
2.0000 mg | INTRAMUSCULAR | Status: DC | PRN
  Administered 2013-08-08 – 2013-08-09 (×3): 2 mg via INTRAVENOUS
  Filled 2013-08-08 (×3): qty 1

## 2013-08-08 MED ORDER — NICARDIPINE HCL IN NACL 20-0.86 MG/200ML-% IV SOLN
3.0000 mg/h | INTRAVENOUS | Status: DC
  Administered 2013-08-08 (×2): 10 mg/h via INTRAVENOUS
  Administered 2013-08-08: 15 mg/h via INTRAVENOUS
  Administered 2013-08-09 (×3): 10 mg/h via INTRAVENOUS
  Administered 2013-08-09: 5 mg/h via INTRAVENOUS
  Administered 2013-08-09: 10 mg/h via INTRAVENOUS
  Filled 2013-08-08 (×7): qty 200

## 2013-08-08 MED ORDER — HYDRALAZINE HCL 20 MG/ML IJ SOLN
10.0000 mg | Freq: Once | INTRAMUSCULAR | Status: AC
Start: 2013-08-08 — End: 2013-08-08
  Administered 2013-08-08: 10 mg via INTRAVENOUS
  Filled 2013-08-08: qty 1

## 2013-08-08 MED ORDER — MORPHINE SULFATE 4 MG/ML IJ SOLN
4.0000 mg | Freq: Once | INTRAMUSCULAR | Status: AC
  Administered 2013-08-08: 4 mg via INTRAVENOUS
  Filled 2013-08-08: qty 1

## 2013-08-08 MED ORDER — HYDRALAZINE HCL 20 MG/ML IJ SOLN
10.0000 mg | Freq: Once | INTRAMUSCULAR | Status: AC
  Administered 2013-08-08: 10 mg via INTRAVENOUS
  Filled 2013-08-08: qty 1

## 2013-08-08 MED ORDER — ONDANSETRON HCL 4 MG PO TABS: 4.0000 mg | ORAL_TABLET | Freq: Four times a day (QID) | ORAL | Status: DC | PRN

## 2013-08-08 MED ORDER — CARVEDILOL 25 MG PO TABS
25.0000 mg | ORAL_TABLET | Freq: Two times a day (BID) | ORAL | Status: DC
  Administered 2013-08-08 – 2013-08-09 (×2): 25 mg via ORAL
  Filled 2013-08-08 (×3): qty 1

## 2013-08-08 MED ORDER — LABETALOL HCL 5 MG/ML IV SOLN: 10.0000 mg | Freq: Once | INTRAVENOUS | Status: DC

## 2013-08-08 MED ORDER — SODIUM CHLORIDE 0.9 % IV SOLN
1.5000 g | Freq: Four times a day (QID) | INTRAVENOUS | Status: DC
  Administered 2013-08-08: 1.5 g via INTRAVENOUS
  Filled 2013-08-08 (×3): qty 1.5

## 2013-08-08 MED ORDER — HYDRALAZINE HCL 25 MG PO TABS
25.0000 mg | ORAL_TABLET | Freq: Three times a day (TID) | ORAL | Status: DC
  Administered 2013-08-08 – 2013-08-09 (×2): 25 mg via ORAL
  Filled 2013-08-08 (×4): qty 1

## 2013-08-08 MED ORDER — HEPARIN SODIUM (PORCINE) 5000 UNIT/ML IJ SOLN
5000.0000 [IU] | Freq: Three times a day (TID) | INTRAMUSCULAR | Status: DC
  Administered 2013-08-08 – 2013-08-09 (×2): 5000 [IU] via SUBCUTANEOUS
  Filled 2013-08-08 (×5): qty 1

## 2013-08-08 MED ORDER — ONDANSETRON HCL 4 MG/2ML IJ SOLN
4.0000 mg | Freq: Once | INTRAMUSCULAR | Status: AC
  Administered 2013-08-08: 4 mg via INTRAVENOUS
  Filled 2013-08-08: qty 2

## 2013-08-08 MED ORDER — OXYCODONE HCL 5 MG PO TABS
5.0000 mg | ORAL_TABLET | ORAL | Status: DC | PRN
  Administered 2013-08-09: 5 mg via ORAL
  Filled 2013-08-08 (×5): qty 1

## 2013-08-08 MED ORDER — NICARDIPINE HCL IN NACL 20-0.86 MG/200ML-% IV SOLN
3.0000 mg/h | Freq: Once | INTRAVENOUS | Status: AC
  Administered 2013-08-08: 5 mg/h via INTRAVENOUS
  Filled 2013-08-08 (×2): qty 200

## 2013-08-08 MED ORDER — AMPICILLIN-SULBACTAM SODIUM 1.5 (1-0.5) G IJ SOLR
1.5000 g | Freq: Four times a day (QID) | INTRAMUSCULAR | Status: DC
  Administered 2013-08-08 – 2013-08-09 (×3): 1.5 g via INTRAVENOUS
  Filled 2013-08-08 (×6): qty 1.5

## 2013-08-08 MED ORDER — ALUM & MAG HYDROXIDE-SIMETH 200-200-20 MG/5ML PO SUSP: 30.0000 mL | Freq: Four times a day (QID) | ORAL | Status: DC | PRN

## 2013-08-08 NOTE — ED Notes (Signed)
Patient transported to CT 

## 2013-08-08 NOTE — ED Notes (Signed)
Repeat EKG related to increase in HR. Dr

## 2013-08-08 NOTE — Progress Notes (Addendum)
08/08/2013 patient transfer from the emergency room to 2 central after 1800. Janett Billow from the emergency room reported patient blood pressure was180/130 and he was given hydrazine 10mg  at 1156 and 1305. , then eventually he was placed on Cardene and he was max out is blood pressure then was 147/88. Patient heart rate was in the 120's. Rn responsible arrived to unit at 1815 and ED nurse and Charge nurse was in room. Charge nurse reported to both the ED ans nurse responsible for patient that he have to transfer because of drip patient is on. Patient was alert ,oriented when was  On unit. Blood pressure was 166/105, 119 at 1815 and at 1830 167/106 and heart rate 118. Report given to nurse  On 2900. While taking patient to unit reported patient was in bundle branch block, Rn did mention to this nurse on the unit. Patient was sweaty and weak, while on 2900 blood pressure was taken it was 95/48 heart rate 65. [facility] RN.

## 2013-08-08 NOTE — ED Notes (Signed)
Pt c/o increase in HA and numbness to left arm. MD notified. Pt alert x4 resp easy non labored. Mae randomly. Warm compress given related to sinus pressure to right side of face.

## 2013-08-08 NOTE — ED Provider Notes (Signed)
CSN: [id number]     Arrival date & time 08/08/13  1041 History   First MD Initiated Contact with Patient 08/08/13 1141     Chief Complaint  Patient presents with  . Chest Pain     (Consider location/radiation/quality/duration/timing/severity/associated sxs/prior Treatment) Patient is a 43 y.o. male presenting with chest pain. The history is provided by the patient.  Chest Pain chest discomfort with chills x 1 week, worse last 3 days with assoicated nausea and vomiting--no doe but some orthropnea--hasn't used his bp meds x 3 days--no syncope or near syncope, no prior h/o same--sx persistent and nothing makes them better, no tx used pta  Past Medical History  Diagnosis Date  . Hypertension     a. Hx of HTN urgency secondary to noncompliance. b. urinary metanephrine and catecholeamine levels normal 2013.  [name] Anxiety   . GERD (gastroesophageal reflux disease)   . Acute renal failure     June 2012 felt secondary to Toradol  . Chronic renal failure   . CHF (congestive heart failure)     a. 05/2011: Adm with pulm edema/HTN urgency, EF 35-40% with diffuse hypokinesis and moderate to severe mitral regurgitation. Cardiomyopathy likely due to uncontrolled HTN and ETOH abuse - cath deferred due to renal insufficiency (felt due to uncontrolled HTN). bJodie Echevaria MV 06/2011: EF 37% and no ischemia or infarction. c. EF 45-50% by echo 01/2012.  . Cardiomyopathy   . HYPERLIPIDEMIA   . INGUINAL HERNIA   . ETOH abuse     a. Reported to have quit 05/2011.  . Valvular heart disease     a. Echo 05/2011: moderate to severe eccentric MR and mild to moderate AI with prolapsing left coronary cusp. b. Echo 01/2012: mild-mod AI, mild dilitation of aortic root, mild MR.  . CKD (chronic kidney disease)     a. 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  . Tobacco abuse    Past Surgical History  Procedure Laterality Date  . Hernia repair    . Ankle surgery      Fractures bilaterally   Family History  Problem Relation Age of  Onset  . Hypertension    . Colon cancer Paternal Uncle    History  Substance Use Topics  . Smoking status: Current Some Day Smoker -- 0.25 packs/day for 20 years    Types: Cigarettes  . Smokeless tobacco: Never Used  . Alcohol Use: Yes     Comment: used to drink 1 pint of gin a week, trying to stop    Review of Systems  Cardiovascular: Positive for chest pain.  All other systems reviewed and are negative.     Allergies  Imdur  Home Medications   Prior to Admission medications   Medication Sig Start Date End Date Taking? Authorizing Provider  amLODipine (NORVASC) 10 MG tablet Take 10 mg by mouth daily.    Historical Provider, MD  amLODipine (NORVASC) 10 MG tablet Take 1 tablet (10 mg total) by mouth daily. 05/06/13   Coral Spikes, DO  aspirin EC 81 MG EC tablet Take 1 tablet (81 mg total) by mouth daily. 05/06/13   Waldemar Dickens, MD  carvedilol (COREG) 25 MG tablet Take 1 tablet (25 mg total) by mouth 2 (two) times daily. 05/06/13   Coral Spikes, DO  cyclobenzaprine (FLEXERIL) 5 MG tablet Take 1 tablet (5 mg total) by mouth 3 (three) times daily as needed for muscle spasms (please give 1st dose now). 05/06/13   Waldemar Dickens, MD  diclofenac sodium (  VOLTAREN) 1 % GEL Apply 2 g topically 4 (four) times daily. 05/06/13   Waldemar Dickens, MD  furosemide (LASIX) 20 MG tablet Take 1 tablet (20 mg total) by mouth daily as needed for fluid or edema (shortness of breath). 05/06/13   Waldemar Dickens, MD  hydrALAZINE (APRESOLINE) 25 MG tablet Take 25 mg by mouth 3 (three) times daily.    Historical Provider, MD  hydrALAZINE (APRESOLINE) 25 MG tablet Take 1 tablet (25 mg total) by mouth 3 (three) times daily. 05/06/13   Coral Spikes, DO  lisinopril (PRINIVIL,ZESTRIL) 20 MG tablet Take 20 mg by mouth daily.    Historical Provider, MD  lisinopril (PRINIVIL,ZESTRIL) 20 MG tablet Take 1 tablet (20 mg total) by mouth daily. 05/06/13   Coral Spikes, DO  ondansetron (ZOFRAN) 4 MG tablet Take 1 tablet  (4 mg total) by mouth every 6 (six) hours. 03/04/13   Glendell Docker, NP  traMADol (ULTRAM) 50 MG tablet Take 1 tablet (50 mg total) by mouth every 6 (six) hours as needed. 07/21/13   Margarita Mail, PA-C   BP 206/133  Pulse 86  Temp(Src) 97.3 F (36.3 C) (Oral)  Resp 18  Ht 5\' 10"  (1.778 m)  Wt 160 lb (72.576 kg)  BMI 22.96 kg/m2  SpO2 100% Physical Exam  Nursing note and vitals reviewed. Constitutional: He is oriented to person, place, and time. He appears well-developed and well-nourished.  Non-toxic appearance. No distress.  HENT:  Head: Normocephalic and atraumatic.  Eyes: Conjunctivae, EOM and lids are normal. Pupils are equal, round, and reactive to light.  Neck: Normal range of motion. Neck supple. No tracheal deviation present. No mass present.  Cardiovascular: Normal rate, regular rhythm and normal heart sounds.  Exam reveals no gallop.   No murmur heard. Pulmonary/Chest: Effort normal and breath sounds normal. No stridor. No respiratory distress. He has no decreased breath sounds. He has no wheezes. He has no rhonchi. He has no rales.  Abdominal: Soft. Normal appearance and bowel sounds are normal. He exhibits no distension. There is no tenderness. There is no rebound and no CVA tenderness.  Musculoskeletal: Normal range of motion. He exhibits no edema and no tenderness.  Neurological: He is alert and oriented to person, place, and time. He has normal strength. No cranial nerve deficit or sensory deficit. GCS eye subscore is 4. GCS verbal subscore is 5. GCS motor subscore is 6.  Skin: Skin is warm and dry. No abrasion and no rash noted.  Psychiatric: He has a normal mood and affect. His speech is normal and behavior is normal.    ED Course  Procedures (including critical care time) Labs Review Labs Reviewed  CBC  TROPONIN I  COMPREHENSIVE METABOLIC PANEL  PRO B NATRIURETIC PEPTIDE    Imaging Review No results found.   EKG Interpretation   Date/Time:  Tuesday  Aug 08 2013 10:48:02 EDT Ventricular Rate:  82 PR Interval:  150 QRS Duration: 100 QT Interval:  402 QTC Calculation: 469 R Axis:   2 Text Interpretation:  Normal sinus rhythm Left atrial enlargement Left  ventricular hypertrophy ST \\T \ T wave abnormality, consider lateral  ischemia Prolonged QT Abnormal ECG No significant change since last  tracing Confirmed by Christy Gentles  MD, Elenore Rota ([id number]) on 08/08/2013 10:51:39 AM  Also confirmed by Zenia Resides  MD, Zailen Albarran ([id number])  on 08/08/2013 11:45:06 AM      MDM   Final diagnoses:  None     Patient given hydralazine 10 mg IV  push x2. He remains hypertensive. Patient to be placed on Cardizem drip and admitted to medicine service.  CRITICAL CARE Performed by: Leota Jacobsen Total critical care time: 60 Critical care time was exclusive of separately billable procedures and treating other patients. Critical care was necessary to treat or prevent imminent or life-threatening deterioration. Critical care was time spent personally by me on the following activities: development of treatment plan with patient and/or surrogate as well as nursing, discussions with consultants, evaluation of patient's response to treatment, examination of patient, obtaining history from patient or surrogate, ordering and performing treatments and interventions, ordering and review of laboratory studies, ordering and review of radiographic studies, pulse oximetry and re-evaluation of patient's condition.    Leota Jacobsen, MD 08/08/13 1345

## 2013-08-08 NOTE — ED Notes (Signed)
Repeat EKG related to increase in HR. Dr Zena Amos at bedside. No new orders at this time.

## 2013-08-08 NOTE — Progress Notes (Signed)
ANTIBIOTIC CONSULT NOTE - INITIAL  Pharmacy Consult for unasyn Indication: sinusitis  Allergies  Allergen Reactions  . Imdur [Isosorbide] Other (See Comments)    headache    Patient Measurements: Height: 5\' 10"  (177.8 cm) Weight: 160 lb (72.576 kg) IBW/kg (Calculated) : 73   Vital Signs: Temp: 97.3 F (36.3 C) (05/26 1058) Temp src: Oral (05/26 1058) BP: 183/119 mmHg (05/26 1450) Pulse Rate: 116 (05/26 1456) Intake/Output from previous day:   Intake/Output from this shift:    Labs:  Recent Labs  08/08/13 1145  WBC 6.1  HGB 13.9  PLT 149*  CREATININE 1.53*   Estimated Creatinine Clearance: 65.2 ml/min (by C-G formula based on Cr of 1.53). No results found for this basename: VANCOTROUGH, VANCOPEAK, VANCORANDOM, GENTTROUGH, GENTPEAK, GENTRANDOM, TOBRATROUGH, TOBRAPEAK, TOBRARND, AMIKACINPEAK, AMIKACINTROU, AMIKACIN,  in the last 72 hours   Microbiology: No results found for this or any previous visit (from the past 720 hour(s)).  Medical History: Past Medical History  Diagnosis Date  . Hypertension     a. Hx of HTN urgency secondary to noncompliance. b. urinary metanephrine and catecholeamine levels normal 2013.  [name] Anxiety   . GERD (gastroesophageal reflux disease)   . Acute renal failure     June 2012 felt secondary to Toradol  . Chronic renal failure   . CHF (congestive heart failure)     a. 05/2011: Adm with pulm edema/HTN urgency, EF 35-40% with diffuse hypokinesis and moderate to severe mitral regurgitation. Cardiomyopathy likely due to uncontrolled HTN and ETOH abuse - cath deferred due to renal insufficiency (felt due to uncontrolled HTN). bJodie Echevaria MV 06/2011: EF 37% and no ischemia or infarction. c. EF 45-50% by echo 01/2012.  . Cardiomyopathy   . HYPERLIPIDEMIA   . INGUINAL HERNIA   . ETOH abuse     a. Reported to have quit 05/2011.  . Valvular heart disease     a. Echo 05/2011: moderate to severe eccentric MR and mild to moderate AI with prolapsing  left coronary cusp. b. Echo 01/2012: mild-mod AI, mild dilitation of aortic root, mild MR.  . CKD (chronic kidney disease)     a. 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  . Tobacco abuse     Medications:  See med rec  Assessment: Patient is a 43 y.o M presented to [facility] ED with c/o  cough, congestion, and vomiting.  To start unasyn for sinusitis (per Head CT).   Plan:  1) change unasyn to 1.5gm IV q6h 2) will monitor renal function closely and adjust dose if/when appropriate.  Laquita Harlan P Ellis Koffler 08/08/2013,2:59 PM

## 2013-08-08 NOTE — ED Notes (Signed)
Attempt to call report.

## 2013-08-08 NOTE — ED Notes (Signed)
Reports 1 week of CP with cough, congestion and vomiting, has not been able to take BP meds due to vomiting, alert, oriented and ambulatory in triage, NAD

## 2013-08-08 NOTE — H&P (Signed)
[facility] History and Physical  [name] [id number] DOB: 06-07-1970 DOA: 08/08/2013  Referring physician:  PCP: [name], MD   Chief Complaint: Nausea/vomiting  HPI: [name] is a 43 y.o. male with a past medical history of hypertension, chronic systolic congestive heart failure ejection fraction 45% based on transthoracic echocardiogram from 02/21/2013, was admitted in February of 2015 treated for hypertensive emergency as it appears that time he was unable to take his medications do to multiple episodes of nausea and vomiting. He presents to the emergency department today with similar complaints. He reports having multiple episodes of nausea and vomiting over the past 3 days, unable to tolerate by mouth as he has been off of his antihypertensive agents during this time. He also complains of cough, congestion, frontal and right maxillary pain, post nasal drip, generalized weakness, malaise, chills. He was found to be hypertensive in the emergency room and started on a Cardene drip. Chest x-ray showed cardiomegaly without evidence of overt congestive heart failure. Labs revealed  stable kidney function with creatinine 1.5. Troponin was negative.                                                                                       Review of Systems:  Constitutional:  No weight loss, night sweats, Fevers, positive for chills, fatigue, malaise.  HEENT:  Positive for headaches, right frontal and right maxillary pain, Difficulty swallowing,Tooth/dental problems,Sore throat,  No sneezing, itching, ear ache, positive for nasal congestion and post nasal drip,  Cardio-vascular:  No chest pain, Orthopnea, PND, swelling in lower extremities, anasarca, dizziness, palpitations  GI:  No heartburn, indigestion, abdominal pain, nausea, vomiting, diarrhea, change in bowel habits, loss of appetite  Resp:  No shortness of breath with exertion or at rest. No excess mucus, no productive  cough, No non-productive cough, No coughing up of blood.No change in color of mucus.No wheezing.No chest wall deformity  Skin:  no rash or lesions.  GU:  no dysuria, change in color of urine, no urgency or frequency. No flank pain.  Musculoskeletal:  No joint pain or swelling. No decreased range of motion. No back pain.  Psych:  No change in mood or affect. No depression or anxiety. No memory loss.   Past Medical History  Diagnosis Date  . Hypertension     a. Hx of HTN urgency secondary to noncompliance. b. urinary metanephrine and catecholeamine levels normal 2013.  [name] Anxiety   . GERD (gastroesophageal reflux disease)   . Acute renal failure     June 2012 felt secondary to Toradol  . Chronic renal failure   . CHF (congestive heart failure)     a. 05/2011: Adm with pulm edema/HTN urgency, EF 35-40% with diffuse hypokinesis and moderate to severe mitral regurgitation. Cardiomyopathy likely due to uncontrolled HTN and ETOH abuse - cath deferred due to renal insufficiency (felt due to uncontrolled HTN). bJodie Echevaria MV 06/2011: EF 37% and no ischemia or infarction. c. EF 45-50% by echo 01/2012.  . Cardiomyopathy   . HYPERLIPIDEMIA   . INGUINAL HERNIA   . ETOH abuse     a. Reported to have quit 05/2011.  [name]  Valvular heart disease     a. Echo 05/2011: moderate to severe eccentric MR and mild to moderate AI with prolapsing left coronary cusp. b. Echo 01/2012: mild-mod AI, mild dilitation of aortic root, mild MR.  . CKD (chronic kidney disease)     a. 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  . Tobacco abuse    Past Surgical History  Procedure Laterality Date  . Hernia repair    . Ankle surgery      Fractures bilaterally   Social History:  reports that he has been smoking Cigarettes.  He has a 5 pack-year smoking history. He has never used smokeless tobacco. He reports that he drinks alcohol. He reports that he uses illicit drugs (Marijuana).  Allergies  Allergen Reactions  . Imdur [Isosorbide] Other  (See Comments)    headache    Family History  Problem Relation Age of Onset  . Hypertension    . Colon cancer Paternal Uncle      Prior to Admission medications   Medication Sig Start Date End Date Taking? Authorizing Provider  amLODipine (NORVASC) 10 MG tablet Take 10 mg by mouth daily.   Yes Historical Provider, MD  carvedilol (COREG) 25 MG tablet Take 1 tablet (25 mg total) by mouth 2 (two) times daily. 05/06/13  Yes Coral Spikes, DO  cyclobenzaprine (FLEXERIL) 5 MG tablet Take 1 tablet (5 mg total) by mouth 3 (three) times daily as needed for muscle spasms (please give 1st dose now). 05/06/13  Yes Waldemar Dickens, MD  diclofenac sodium (VOLTAREN) 1 % GEL Apply 2 g topically 4 (four) times daily. 05/06/13  Yes Waldemar Dickens, MD  furosemide (LASIX) 20 MG tablet Take 1 tablet (20 mg total) by mouth daily as needed for fluid or edema (shortness of breath). 05/06/13  Yes Waldemar Dickens, MD  hydrALAZINE (APRESOLINE) 25 MG tablet Take 25 mg by mouth 3 (three) times daily.   Yes Historical Provider, MD  lisinopril (PRINIVIL,ZESTRIL) 20 MG tablet Take 20 mg by mouth daily.   Yes Historical Provider, MD   Physical Exam: Filed Vitals:   08/08/13 1456  BP:   Pulse: 116  Temp:   Resp: 0    BP 183/119  Pulse 116  Temp(Src) 97.3 F (36.3 C) (Oral)  Resp 0  Ht 5\' 10"  (1.778 m)  Wt 72.576 kg (160 lb)  BMI 22.96 kg/m2  SpO2 100%  General:  Ill-appearing, had episode of nausea and vomiting during my counter, does not appear to be in respiratory failure, breathing comfortably Eyes: PERRL, normal lids, irises & conjunctiva ENT: grossly normal hearing, lips & tongue. He has pain to palpation over right frontal sinus and right maxillary sinuses Neck: no LAD, masses or thyromegaly Cardiovascular: Tachycardic RRR, no m/r/g. No LE edema. Telemetry: SR, no arrhythmias  Respiratory: CTA bilaterally, no w/r/r. Normal respiratory effort. Abdomen: soft, ntnd Skin: no rash or induration seen on  limited exam Musculoskeletal: grossly normal tone BUE/BLE Psychiatric: grossly normal mood and affect, speech fluent and appropriate Neurologic: grossly non-focal.          Labs on Admission:  Basic Metabolic Panel:  Recent Labs Lab 08/08/13 1145  NA 139  K 4.0  CL 103  CO2 23  GLUCOSE 93  BUN 12  CREATININE 1.53*  CALCIUM 9.2   Liver Function Tests:  Recent Labs Lab 08/08/13 1145  AST 19  ALT 12  ALKPHOS 99  BILITOT 0.6  PROT 7.6  ALBUMIN 3.7   No results found for this  basename: LIPASE, AMYLASE,  in the last 168 hours No results found for this basename: AMMONIA,  in the last 168 hours CBC:  Recent Labs Lab 08/08/13 1145  WBC 6.1  HGB 13.9  HCT 40.3  MCV 92.2  PLT 149*   Cardiac Enzymes:  Recent Labs Lab 08/08/13 1145  TROPONINI <0.30    BNP (last 3 results)  Recent Labs  02/20/13 1130 05/05/13 1506 08/08/13 1145  PROBNP 795.2* 1915.0* 1031.0*   CBG: No results found for this basename: GLUCAP,  in the last 168 hours  Radiological Exams on Admission: Dg Chest 2 View  08/08/2013   CLINICAL DATA:  Cough.  EXAM: CHEST  2 VIEW  COMPARISON:  DG CHEST 2 VIEW dated 05/05/2013  FINDINGS: Mediastinum hilar structures are normal. The lungs are clear. Cardiomegaly, no CHF. No pleural effusion or pneumothorax.  IMPRESSION: Cardiomegaly. This is stable. No evidence of overt congestive heart failure.   Electronically Signed   By: Coleman   On: 08/08/2013 12:48   Ct Head Wo Contrast  08/08/2013   CLINICAL DATA:  Vomiting.  EXAM: CT HEAD WITHOUT CONTRAST  TECHNIQUE: Contiguous axial images were obtained from the base of the skull through the vertex without intravenous contrast.  COMPARISON:  Head CT 05/06/2013.  FINDINGS: No mass. No hydrocephalus. No hemorrhage. No acute bony abnormality. Opacification of the right maxillary sinus and the right nasopharynx. Diffuse mucosal thickening of the ethmoidal sinuses. No evidence of fracture or dislocation.   IMPRESSION: 1. No acute intracranial abnormality. 2. Opacification of the right maxillary sinus with mild mucosal thickening in the ethmoid sinuses and mucosal thickening noted in the right nasopharynx. These findings are most consistent with sinusitis. Follow-up imaging is suggested to exclude a nasopharyngeal or sinus mass.   Electronically Signed   By: Marcello Moores  Register   On: 08/08/2013 14:09    EKG: Independently reviewed.   Assessment/Plan Principal Problem:   Hypertensive crisis Active Problems:   Nausea & vomiting   CKD (chronic kidney disease)   Sinusitis, bacterial   Cough   HYPERLIPIDEMIA   CHF (congestive heart failure)   1. Hypertensive crisis. Patient having a similar presentation back in February of this year when he presented with multiple episodes of nausea vomiting, unable to take his blood pressure medications and presented in hypertensive crisis. He was started on a Cardene drip in the emergency room and will admit patient to the step down unit. Chest x-ray did not show overt CHF. Previous notes have suggested concerns for medication adherence.  2. Chronic diastolic and systolic congestive heart failure. Status post transthoracic echocardiogram on 02/21/2013 which showed an ejection fraction of 45-50% with diffuse hypokinesis. Wall thickness was increased in pattern of moderate LVH. Lab work showing a BNP of 1,030, previously 1,915 on 05/05/2013. Chest x-ray clear, on exam does not appear to have acute CHF. Given multiple episodes of nausea vomiting and minimal by mouth intake over the past several days will hold off on Lasix, reassess his volume status in a.m. 3. Nausea vomiting. Unclear etiology. Liver enzymes within normal limits. He had a similar presentation back in February of 2015. He denies abdominal pain, diarrhea, hematemesis. Patient attributes nausea vomiting to significant postnasal drip. Provide supportive care, as needed antiemetic therapy. 4. Suspected bacterial  sinusitis. Patient complaining of post nasal drip associated with persistent cough, right sided facial pain, congestion, subjective fever, for the past week. Will treat with Unasyn IV. Not tolerating PO.  5. Stage III chronic kidney disease.  Patient have a baseline creatinine of 1.5. Kidney function currently stable.  6. DVT prophylaxis. Heparin     Code Status: Full Code Family Communication:  Disposition Plan: Admit patient to step down unit, dysphagia require greater than 2 night hospitalization  Time spent: 70 min  [facility] Pager [phone]  **Disclaimer: This note may have been dictated with voice recognition software. Similar sounding words can inadvertently be transcribed and this note may contain transcription errors which may not have been corrected upon publication of note.**

## 2013-08-09 ENCOUNTER — Inpatient Hospital Stay (HOSPITAL_COMMUNITY): Payer: Medicare Other

## 2013-08-09 DIAGNOSIS — I10 Essential (primary) hypertension: Secondary | ICD-10-CM | POA: Diagnosis not present

## 2013-08-09 DIAGNOSIS — R109 Unspecified abdominal pain: Secondary | ICD-10-CM | POA: Diagnosis not present

## 2013-08-09 LAB — CBC
HEMATOCRIT: 39.3 % (ref 39.0–52.0)
Hemoglobin: 13.6 g/dL (ref 13.0–17.0)
MCH: 31.9 pg (ref 26.0–34.0)
MCHC: 34.6 g/dL (ref 30.0–36.0)
MCV: 92 fL (ref 78.0–100.0)
Platelets: 165 10*3/uL (ref 150–400)
RBC: 4.27 MIL/uL (ref 4.22–5.81)
RDW: 14.3 % (ref 11.5–15.5)
WBC: 8.4 10*3/uL (ref 4.0–10.5)

## 2013-08-09 LAB — BASIC METABOLIC PANEL
BUN: 9 mg/dL (ref 6–23)
CO2: 20 meq/L (ref 19–32)
CREATININE: 1.34 mg/dL (ref 0.50–1.35)
Calcium: 8.9 mg/dL (ref 8.4–10.5)
Chloride: 99 mEq/L (ref 96–112)
GFR calc Af Amer: 75 mL/min — ABNORMAL LOW (ref >90)
GFR calc non Af Amer: 64 mL/min — ABNORMAL LOW (ref >90)
GLUCOSE: 116 mg/dL — AB (ref 70–99)
Potassium: 3.3 mEq/L — ABNORMAL LOW (ref 3.7–5.3)
Sodium: 135 mEq/L — ABNORMAL LOW (ref 137–147)

## 2013-08-09 MED ORDER — NICARDIPINE HCL IN DEXTROSE 40-5 MG/200ML-% IV SOLN: 3.0000 mg/h | INTRAVENOUS | Status: DC

## 2013-08-09 NOTE — Discharge Summary (Signed)
DISCHARGE SUMMARY  [name]  MR#: [id number]  DOB:[birth year]  Date of Admission: 08/08/2013 Date of Discharge: 08/09/2013  Attending Physician:Jeffrey T McClung  Patient's CK:[id number], MD  Disposition: Pt requested d/c home immediatly  Follow-up Appts: Pt instructed on importance of following up with his PCP ASAP for monitoring of his BP   Discharge Diagnoses: Hypertensive crisis  Chronic diastolic and systolic heart failure Mild hypokalemia  Suspected bacterial sinusitis  Valvular heart disease   CKD II  Tobacco abuse   Initial presentation: 43 y.o. male with a past medical history of hypertension, chronic systolic congestive heart failure ejection fraction 45% based on transthoracic echocardiogram from 02/21/2013, who was admitted in February of 2015 for hypertensive emergency (appears that time he was unable to take his medications do to multiple episodes of nausea and vomiting). He presented to the emergency department 5/26 with multiple episodes of nausea and vomiting over 3 days, unable to tolerate by mouth intake therefore he had been off of his antihypertensive agents during this time. He was found to be hypertensive in the emergency room and started on a Cardene drip. Chest x-ray showed cardiomegaly without evidence of overt congestive heart failure. Labs revealed stable kidney function with creatinine 1.5. Troponin was negative.   Hospital Course: The patient was admitted to the acute units for treatment of malignant hypertension.  When I came to evaluate the patient on 5/27 he was quite aggravated.  He stated that his sinus congestion and headache were the primary reason that he presented to the hospital and that "nothing was being done about that."  He informed me that he planned to leave the hospital immediately and did not wish to pursue any further inpatient treatment.  I explained to him that I would like to monitor his blood pressure further and continue to  treat what appeared to be a sinusitis with antibiotics but he refused to stay in the hospital.  As a result I told him that he should resume all of his usual medications upon returning home and stressed to him the importance of taking all of them on schedule.  I also provided him with a prescription for Augmentin 875 mg #14 1 by mouth twice a day x7 days.  He confirmed to me that his insurance pays for his medications and that obtaining them was not a problem.  Though I did not require the pt to formally sign out AMA he was not willing to stay around long enough to allow for proper discharge counseling or medication reconciliation.  D/C Meds: -Augmentin 875mg  po BID x7 days - #14 provided -Resume prior prescribed medications (pt would not stay long enough to allow med rec to be completed)  Day of Discharge BP 147/95  Pulse 72  Temp(Src) 97.2 F (36.2 C) (Oral)  Resp 17  Ht 6' (1.829 m)  Wt 71.2 kg (156 lb 15.5 oz)  BMI 21.28 kg/m2  SpO2 100%  Physical Exam: General: No acute respiratory distress Lungs: Clear to auscultation bilaterally without wheezes or crackles Cardiovascular: Regular rate and rhythm without murmur gallop or rub normal S1 and S2 Abdomen: Nontender, nondistended, soft, bowel sounds positive, no rebound, no ascites, no appreciable mass Extremities: No significant cyanosis, clubbing, or edema bilateral lower extremities  Results for orders placed during the hospital encounter of 08/08/13 (from the past 24 hour(s))  MRSA PCR SCREENING     Status: None   Collection Time    08/08/13  6:15 PM      Result  Value Ref Range   MRSA by PCR NEGATIVE  NEGATIVE  GLUCOSE, CAPILLARY     Status: Abnormal   Collection Time    08/08/13  6:51 PM      Result Value Ref Range   Glucose-Capillary 119 (*) 70 - 99 mg/dL  URINE RAPID DRUG SCREEN (HOSP PERFORMED)     Status: Abnormal   Collection Time    08/08/13  8:36 PM      Result Value Ref Range   Opiates POSITIVE (*) NONE DETECTED    Cocaine NONE DETECTED  NONE DETECTED   Benzodiazepines NONE DETECTED  NONE DETECTED   Amphetamines NONE DETECTED  NONE DETECTED   Tetrahydrocannabinol POSITIVE (*) NONE DETECTED   Barbiturates NONE DETECTED  NONE DETECTED  BASIC METABOLIC PANEL     Status: Abnormal   Collection Time    08/09/13  2:38 AM      Result Value Ref Range   Sodium 135 (*) 137 - 147 mEq/L   Potassium 3.3 (*) 3.7 - 5.3 mEq/L   Chloride 99  96 - 112 mEq/L   CO2 20  19 - 32 mEq/L   Glucose, Bld 116 (*) 70 - 99 mg/dL   BUN 9  6 - 23 mg/dL   Creatinine, Ser 1.34  0.50 - 1.35 mg/dL   Calcium 8.9  8.4 - 10.5 mg/dL   GFR calc non Af Amer 64 (*) >90 mL/min   GFR calc Af Amer 75 (*) >90 mL/min  CBC     Status: None   Collection Time    08/09/13  2:38 AM      Result Value Ref Range   WBC 8.4  4.0 - 10.5 K/uL   RBC 4.27  4.22 - 5.81 MIL/uL   Hemoglobin 13.6  13.0 - 17.0 g/dL   HCT 39.3  39.0 - 52.0 %   MCV 92.0  78.0 - 100.0 fL   MCH 31.9  26.0 - 34.0 pg   MCHC 34.6  30.0 - 36.0 g/dL   RDW 14.3  11.5 - 15.5 %   Platelets 165  150 - 400 K/uL    Time spent in discharge (includes decision making & examination of pt): 30 minutes  08/09/2013, 3:45 PM   Cherene Altes, MD [facility] Office  [phone] Pager [phone]  On-Call/Text Page:      [email or web address]      password [facility]

## 2013-08-09 NOTE — Progress Notes (Signed)
Dr. Thereasa Solo was in to see patient and requested that PIV's be removed.  Pt is requesting to leave and is signing out against medical advice. He refuses to sign forms.  A family member is at the bedside and patient left with the family member.

## 2013-08-09 NOTE — Progress Notes (Signed)
08/09/2013 1:34 AM  Pt c/o severe sudden onset bilateral thigh pain 10/10 throbbing. Pedal pulses present, popliteal pulses present. MD made aware. No new orders received.    Mick Sell RN

## 2013-08-09 NOTE — Progress Notes (Signed)
Nutrition Brief Note  Patient identified on the Malnutrition Screening Tool (MST) Report. No significant weight loss noted. Suspect weight fluctuates some with HF.  Wt Readings from Last 15 Encounters:  08/09/13 156 lb 15.5 oz (71.2 kg)  05/06/13 159 lb 13.3 oz (72.5 kg)  02/21/13 156 lb 8.4 oz (71 kg)  01/27/12 162 lb 7.7 oz (73.7 kg)  08/21/11 161 lb (73.029 kg)  07/31/11 164 lb (74.39 kg)  07/17/11 162 lb 6.4 oz (73.664 kg)  06/17/11 157 lb (71.215 kg)  06/09/11 166 lb (75.297 kg)  05/25/11 149 lb 11.1 oz (67.9 kg)  08/26/09 179 lb (81.194 kg)  02/18/09 190 lb (86.183 kg)  01/16/09 191 lb (86.637 kg)    Body mass index is 21.28 kg/(m^2). Patient meets criteria for normal weight based on current BMI.   Current diet order is clear liquids. Labs and medications reviewed.   No nutrition interventions warranted at this time. If nutrition issues arise, please consult RD.   Molli Barrows, RD, LDN, [place] Pager [phone] After Hours Pager [phone]

## 2013-08-09 NOTE — Care Management Note (Signed)
    Page 1 of 1   08/09/2013     8:36:25 AM CARE MANAGEMENT NOTE 08/09/2013  Patient:  [name], [name]   Account Number:  [id number]  Date Initiated:  08/09/2013  Documentation initiated by:  Elissa Hefty  Subjective/Objective Assessment:   adm w htn crisis     Action/Plan:   lives w wife, pcp dr Erlene Quan bowers   Anticipated DC Date:     Anticipated DC Plan:           Choice offered to / List presented to:             Status of service:   Medicare Important Message given?   (If response is "NO", the following Medicare IM given date fields will be blank) Date Medicare IM given:   Date Additional Medicare IM given:    Discharge Disposition:    Per UR Regulation:  Reviewed for med. necessity/level of care/duration of stay  If discussed at [place] of Stay Meetings, dates discussed:    Comments:

## 2013-09-02 IMAGING — CR DG ANKLE COMPLETE 3+V*L*
3 series · 3 of 3 positions shown · non-contrast
Comparison: February 26, 2009

CLINICAL DATA: Left ankle and heel pain

LEFT ANKLE COMPLETE - 3+ VIEW

[t ankle joint ap left]
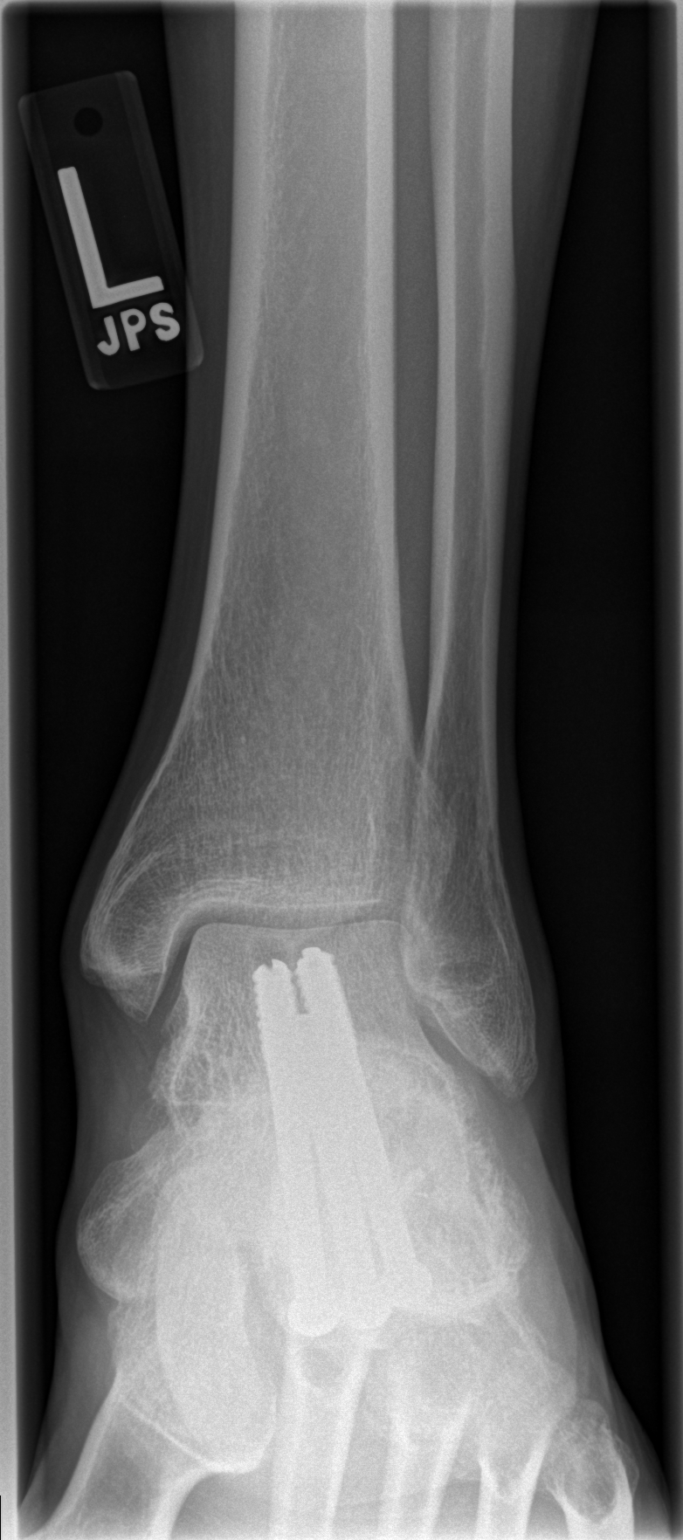

[t ankle joint oblique left]
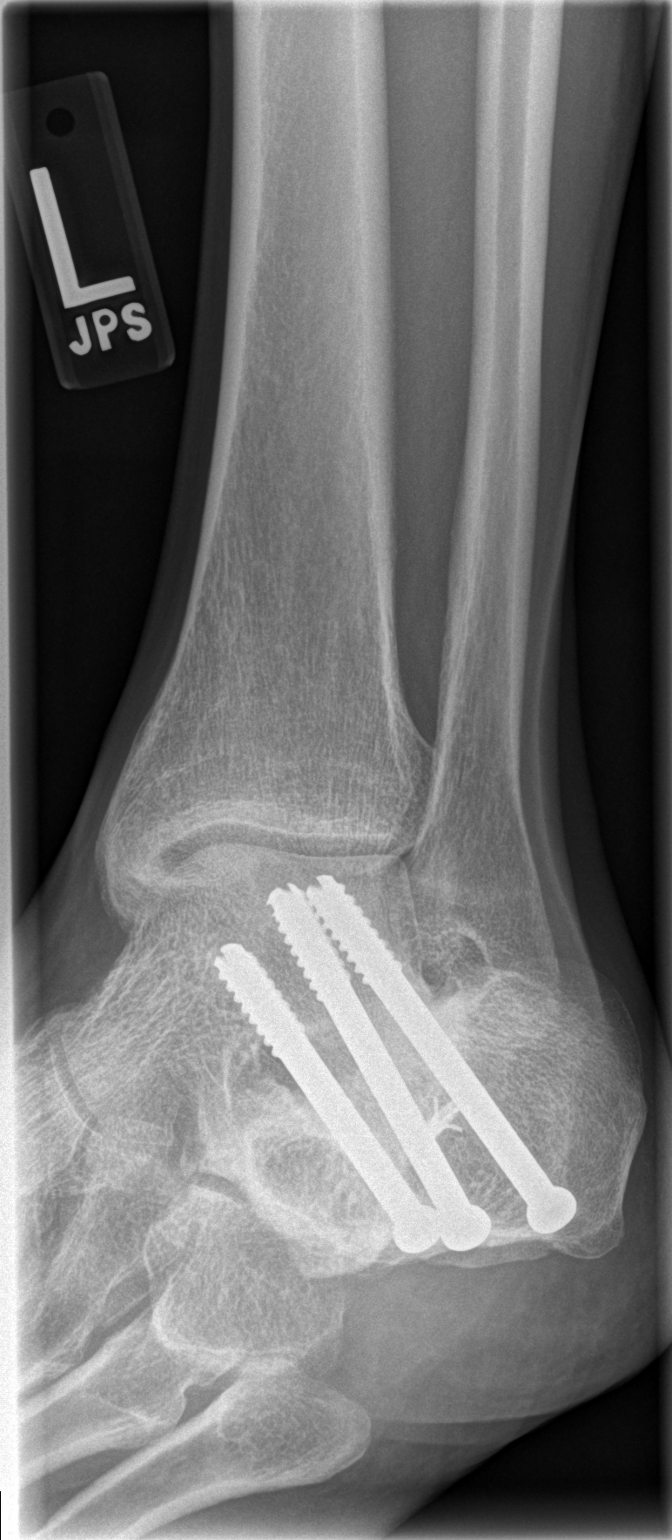

[t ankle joint lat left]
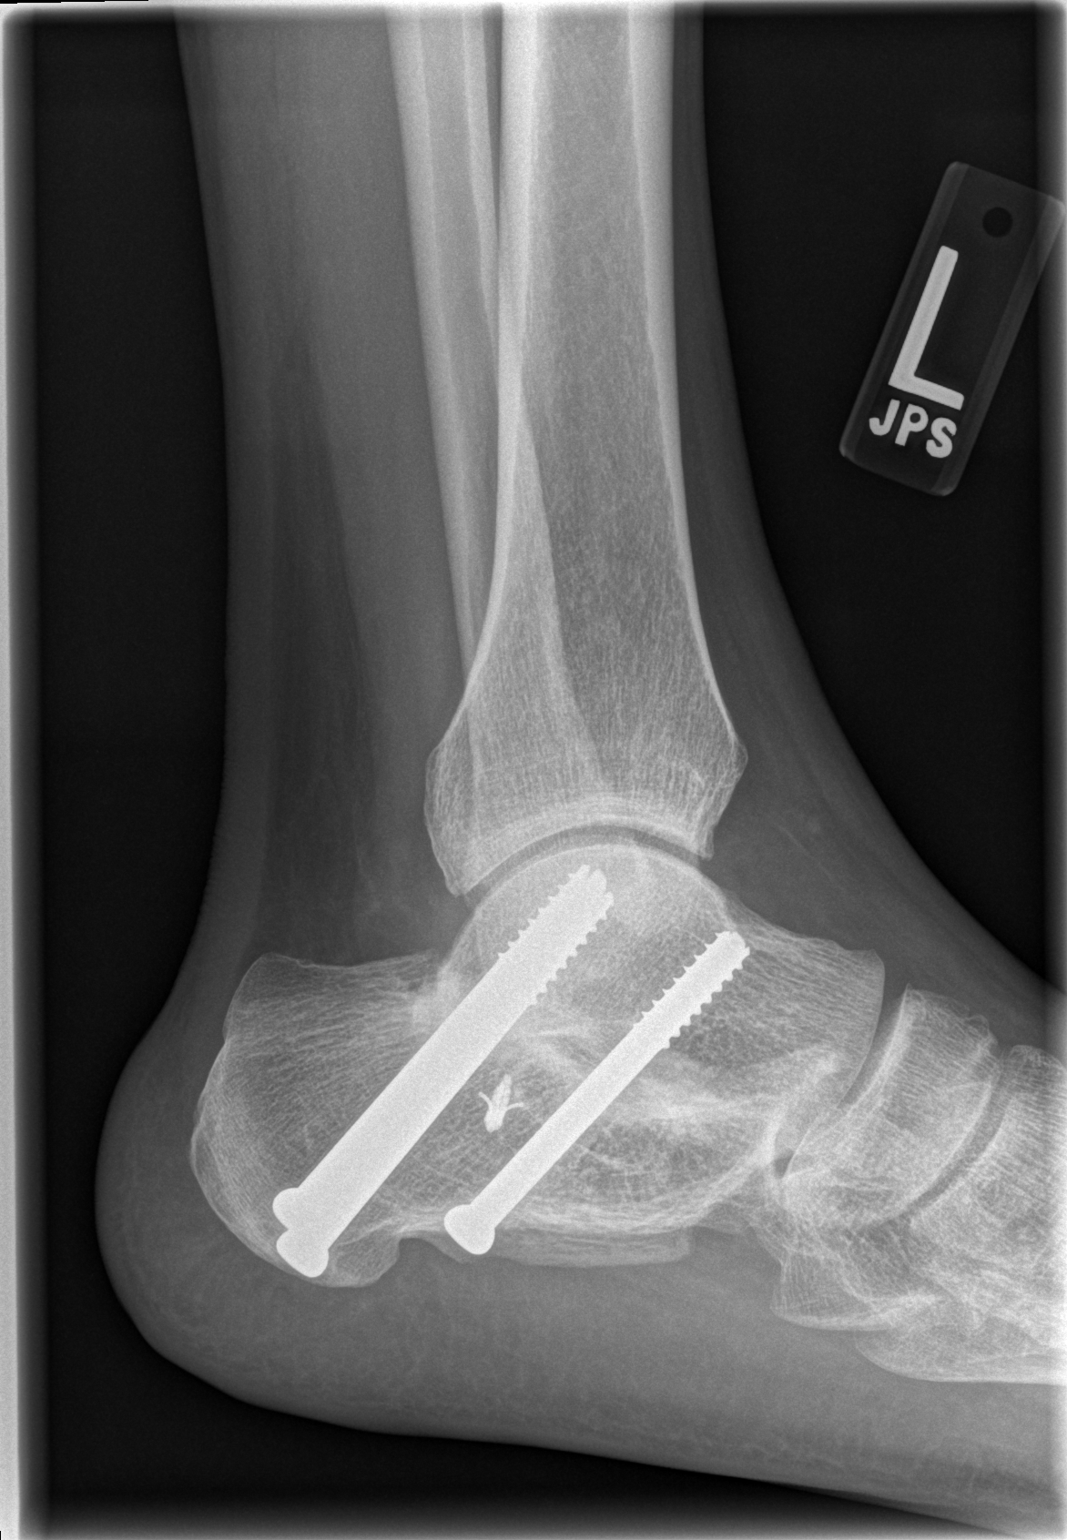

[3 of 3 positions shown; findings below may reference images not displayed]

FINDINGS: Three lag screws transfix the talus and calcaneus. There
is no peri hardware lucency to suggest loosening or infection.
There is diffuse narrowing and sclerosis at the talocalcaneal
joint.  The ankle mortise remains intact. There is no evidence of
acute fracture, dislocation, or periosteal reaction.
IMPRESSION: There is no evidence of acute osseous abnormality.

Surgical fusion of the talus and calcaneus with no evidence of
hardware complication.

## 2013-09-11 ENCOUNTER — Inpatient Hospital Stay (HOSPITAL_COMMUNITY): Payer: Medicare Other

## 2013-09-11 ENCOUNTER — Inpatient Hospital Stay (HOSPITAL_COMMUNITY)
Admission: EM | Admit: 2013-09-11 | Discharge: 2013-09-12 | DRG: 683 | Disposition: A | Discharge status: Home / Self Care | Payer: Medicare Other | Attending: Internal Medicine | Admitting: Internal Medicine

## 2013-09-11 ENCOUNTER — Encounter (HOSPITAL_COMMUNITY): Payer: Self-pay | Admitting: Emergency Medicine

## 2013-09-11 ENCOUNTER — Emergency Department (HOSPITAL_COMMUNITY): Payer: Medicare Other

## 2013-09-11 DIAGNOSIS — F1910 Other psychoactive substance abuse, uncomplicated: Secondary | ICD-10-CM | POA: Diagnosis present

## 2013-09-11 DIAGNOSIS — Z9119 Patient's noncompliance with other medical treatment and regimen: Secondary | ICD-10-CM

## 2013-09-11 DIAGNOSIS — I129 Hypertensive chronic kidney disease with stage 1 through stage 4 chronic kidney disease, or unspecified chronic kidney disease: Secondary | ICD-10-CM | POA: Diagnosis not present

## 2013-09-11 DIAGNOSIS — R079 Chest pain, unspecified: Secondary | ICD-10-CM | POA: Diagnosis not present

## 2013-09-11 DIAGNOSIS — F1010 Alcohol abuse, uncomplicated: Secondary | ICD-10-CM | POA: Diagnosis present

## 2013-09-11 DIAGNOSIS — M51379 Other intervertebral disc degeneration, lumbosacral region without mention of lumbar back pain or lower extremity pain: Secondary | ICD-10-CM | POA: Diagnosis present

## 2013-09-11 DIAGNOSIS — I428 Other cardiomyopathies: Secondary | ICD-10-CM | POA: Diagnosis present

## 2013-09-11 DIAGNOSIS — R51 Headache: Secondary | ICD-10-CM | POA: Diagnosis not present

## 2013-09-11 DIAGNOSIS — I509 Heart failure, unspecified: Secondary | ICD-10-CM | POA: Diagnosis not present

## 2013-09-11 DIAGNOSIS — Z9114 Patient's other noncompliance with medication regimen: Secondary | ICD-10-CM

## 2013-09-11 DIAGNOSIS — I059 Rheumatic mitral valve disease, unspecified: Secondary | ICD-10-CM | POA: Diagnosis present

## 2013-09-11 DIAGNOSIS — F17200 Nicotine dependence, unspecified, uncomplicated: Secondary | ICD-10-CM | POA: Diagnosis present

## 2013-09-11 DIAGNOSIS — G8929 Other chronic pain: Secondary | ICD-10-CM | POA: Diagnosis present

## 2013-09-11 DIAGNOSIS — M5137 Other intervertebral disc degeneration, lumbosacral region: Secondary | ICD-10-CM | POA: Diagnosis present

## 2013-09-11 DIAGNOSIS — G43909 Migraine, unspecified, not intractable, without status migrainosus: Secondary | ICD-10-CM | POA: Diagnosis present

## 2013-09-11 DIAGNOSIS — N183 Chronic kidney disease, stage 3 unspecified: Secondary | ICD-10-CM | POA: Diagnosis present

## 2013-09-11 DIAGNOSIS — I119 Hypertensive heart disease without heart failure: Secondary | ICD-10-CM | POA: Diagnosis not present

## 2013-09-11 DIAGNOSIS — H53149 Visual discomfort, unspecified: Secondary | ICD-10-CM | POA: Diagnosis present

## 2013-09-11 DIAGNOSIS — M542 Cervicalgia: Secondary | ICD-10-CM | POA: Diagnosis present

## 2013-09-11 DIAGNOSIS — I10 Essential (primary) hypertension: Secondary | ICD-10-CM

## 2013-09-11 DIAGNOSIS — F411 Generalized anxiety disorder: Secondary | ICD-10-CM | POA: Diagnosis present

## 2013-09-11 DIAGNOSIS — R1110 Vomiting, unspecified: Secondary | ICD-10-CM | POA: Diagnosis present

## 2013-09-11 DIAGNOSIS — J329 Chronic sinusitis, unspecified: Secondary | ICD-10-CM | POA: Diagnosis present

## 2013-09-11 DIAGNOSIS — R609 Edema, unspecified: Secondary | ICD-10-CM | POA: Diagnosis present

## 2013-09-11 DIAGNOSIS — I5020 Unspecified systolic (congestive) heart failure: Secondary | ICD-10-CM | POA: Diagnosis not present

## 2013-09-11 DIAGNOSIS — K219 Gastro-esophageal reflux disease without esophagitis: Secondary | ICD-10-CM | POA: Diagnosis present

## 2013-09-11 DIAGNOSIS — B9689 Other specified bacterial agents as the cause of diseases classified elsewhere: Secondary | ICD-10-CM | POA: Diagnosis present

## 2013-09-11 DIAGNOSIS — R1033 Periumbilical pain: Secondary | ICD-10-CM | POA: Diagnosis present

## 2013-09-11 DIAGNOSIS — Z91199 Patient's noncompliance with other medical treatment and regimen due to unspecified reason: Secondary | ICD-10-CM | POA: Diagnosis not present

## 2013-09-11 DIAGNOSIS — I169 Hypertensive crisis, unspecified: Secondary | ICD-10-CM

## 2013-09-11 DIAGNOSIS — R0602 Shortness of breath: Secondary | ICD-10-CM | POA: Diagnosis not present

## 2013-09-11 DIAGNOSIS — E785 Hyperlipidemia, unspecified: Secondary | ICD-10-CM | POA: Diagnosis present

## 2013-09-11 DIAGNOSIS — R000 Tachycardia, unspecified: Secondary | ICD-10-CM | POA: Diagnosis present

## 2013-09-11 HISTORY — DX: Patient's other noncompliance with medication regimen: Z91.14

## 2013-09-11 HISTORY — DX: Other intervertebral disc degeneration, lumbar region without mention of lumbar back pain or lower extremity pain: M51.369

## 2013-09-11 HISTORY — DX: Chronic sinusitis, unspecified: J32.9

## 2013-09-11 HISTORY — DX: Patient's other noncompliance with medication regimen for other reason: Z91.148

## 2013-09-11 HISTORY — DX: Other chronic pain: R10.9

## 2013-09-11 HISTORY — DX: Other intervertebral disc degeneration, lumbar region: M51.36

## 2013-09-11 HISTORY — DX: Other chronic pain: G89.29

## 2013-09-11 LAB — COMPREHENSIVE METABOLIC PANEL
ALBUMIN: 3.8 g/dL (ref 3.5–5.2)
ALT: 9 U/L (ref 0–53)
AST: 14 U/L (ref 0–37)
Alkaline Phosphatase: 103 U/L (ref 39–117)
BUN: 12 mg/dL (ref 6–23)
CALCIUM: 9.3 mg/dL (ref 8.4–10.5)
CO2: 21 meq/L (ref 19–32)
Chloride: 100 mEq/L (ref 96–112)
Creatinine, Ser: 1.61 mg/dL — ABNORMAL HIGH (ref 0.50–1.35)
GFR calc Af Amer: 60 mL/min — ABNORMAL LOW (ref >90)
GFR, EST NON AFRICAN AMERICAN: 52 mL/min — AB (ref >90)
Glucose, Bld: 87 mg/dL (ref 70–99)
Potassium: 4 mEq/L (ref 3.7–5.3)
Sodium: 139 mEq/L (ref 137–147)
Total Bilirubin: 0.6 mg/dL (ref 0.3–1.2)
Total Protein: 7.4 g/dL (ref 6.0–8.3)

## 2013-09-11 LAB — CBC
HCT: 40.8 % (ref 39.0–52.0)
Hemoglobin: 13.5 g/dL (ref 13.0–17.0)
MCH: 30.9 pg (ref 26.0–34.0)
MCHC: 33.1 g/dL (ref 30.0–36.0)
MCV: 93.4 fL (ref 78.0–100.0)
PLATELETS: 150 10*3/uL (ref 150–400)
RBC: 4.37 MIL/uL (ref 4.22–5.81)
RDW: 14 % (ref 11.5–15.5)
WBC: 6.2 10*3/uL (ref 4.0–10.5)

## 2013-09-11 LAB — RAPID URINE DRUG SCREEN, HOSP PERFORMED
AMPHETAMINES: NOT DETECTED
Barbiturates: NOT DETECTED
Benzodiazepines: NOT DETECTED
COCAINE: NOT DETECTED
OPIATES: POSITIVE — AB
Tetrahydrocannabinol: POSITIVE — AB

## 2013-09-11 LAB — CBC WITH DIFFERENTIAL/PLATELET
BASOS PCT: 1 % (ref 0–1)
Basophils Absolute: 0 10*3/uL (ref 0.0–0.1)
Eosinophils Absolute: 0.3 10*3/uL (ref 0.0–0.7)
Eosinophils Relative: 5 % (ref 0–5)
HCT: 40 % (ref 39.0–52.0)
Hemoglobin: 13.7 g/dL (ref 13.0–17.0)
LYMPHS ABS: 2.3 10*3/uL (ref 0.7–4.0)
Lymphocytes Relative: 39 % (ref 12–46)
MCH: 31.4 pg (ref 26.0–34.0)
MCHC: 34.3 g/dL (ref 30.0–36.0)
MCV: 91.7 fL (ref 78.0–100.0)
Monocytes Absolute: 0.7 10*3/uL (ref 0.1–1.0)
Monocytes Relative: 11 % (ref 3–12)
NEUTROS ABS: 2.6 10*3/uL (ref 1.7–7.7)
NEUTROS PCT: 44 % (ref 43–77)
Platelets: 157 10*3/uL (ref 150–400)
RBC: 4.36 MIL/uL (ref 4.22–5.81)
RDW: 13.9 % (ref 11.5–15.5)
WBC: 5.9 10*3/uL (ref 4.0–10.5)

## 2013-09-11 LAB — TROPONIN I: Troponin I: <0.3 ng/mL (ref <0.30)

## 2013-09-11 LAB — MRSA PCR SCREENING: MRSA by PCR: NEGATIVE

## 2013-09-11 LAB — CREATININE, SERUM
CREATININE: 1.34 mg/dL (ref 0.50–1.35)
GFR calc Af Amer: 75 mL/min — ABNORMAL LOW (ref >90)
GFR calc non Af Amer: 64 mL/min — ABNORMAL LOW (ref >90)

## 2013-09-11 LAB — LIPASE, BLOOD: Lipase: 17 U/L (ref 11–59)

## 2013-09-11 LAB — ETHANOL: Alcohol, Ethyl (B): <11 mg/dL (ref 0–11)

## 2013-09-11 LAB — PRO B NATRIURETIC PEPTIDE: PRO B NATRI PEPTIDE: 1981 pg/mL — AB (ref 0–125)

## 2013-09-11 MED ORDER — FUROSEMIDE 20 MG PO TABS
20.0000 mg | ORAL_TABLET | Freq: Every day | ORAL | Status: DC | PRN
  Filled 2013-09-11: qty 1

## 2013-09-11 MED ORDER — LEVOFLOXACIN 500 MG PO TABS
500.0000 mg | ORAL_TABLET | Freq: Every day | ORAL | Status: DC
  Administered 2013-09-11 – 2013-09-12 (×2): 500 mg via ORAL
  Filled 2013-09-11 (×2): qty 1

## 2013-09-11 MED ORDER — CARVEDILOL 25 MG PO TABS
25.0000 mg | ORAL_TABLET | Freq: Two times a day (BID) | ORAL | Status: DC
  Administered 2013-09-11 – 2013-09-12 (×2): 25 mg via ORAL
  Filled 2013-09-11 (×3): qty 1

## 2013-09-11 MED ORDER — HYDRALAZINE HCL 20 MG/ML IJ SOLN
20.0000 mg | Freq: Once | INTRAMUSCULAR | Status: AC
  Administered 2013-09-11: 20 mg via INTRAVENOUS
  Filled 2013-09-11 (×2): qty 1

## 2013-09-11 MED ORDER — FENTANYL CITRATE 0.05 MG/ML IJ SOLN
50.0000 ug | INTRAMUSCULAR | Status: DC | PRN
  Filled 2013-09-11: qty 2

## 2013-09-11 MED ORDER — DIPHENHYDRAMINE HCL 50 MG/ML IJ SOLN
25.0000 mg | Freq: Once | INTRAMUSCULAR | Status: AC
  Administered 2013-09-11: 25 mg via INTRAVENOUS
  Filled 2013-09-11: qty 1

## 2013-09-11 MED ORDER — KETOROLAC TROMETHAMINE 30 MG/ML IJ SOLN
30.0000 mg | Freq: Once | INTRAMUSCULAR | Status: AC
  Administered 2013-09-11: 30 mg via INTRAVENOUS
  Filled 2013-09-11: qty 1

## 2013-09-11 MED ORDER — FENTANYL CITRATE 0.05 MG/ML IJ SOLN
50.0000 ug | INTRAMUSCULAR | Status: DC | PRN
  Administered 2013-09-11: 50 ug via INTRAVENOUS

## 2013-09-11 MED ORDER — FLUTICASONE PROPIONATE 50 MCG/ACT NA SUSP
2.0000 | Freq: Every day | NASAL | Status: DC
  Administered 2013-09-11 – 2013-09-12 (×2): 2 via NASAL
  Filled 2013-09-11: qty 16

## 2013-09-11 MED ORDER — SODIUM CHLORIDE 0.9 % IJ SOLN
3.0000 mL | Freq: Two times a day (BID) | INTRAMUSCULAR | Status: DC
  Administered 2013-09-11 – 2013-09-12 (×2): 3 mL via INTRAVENOUS

## 2013-09-11 MED ORDER — AMLODIPINE BESYLATE 10 MG PO TABS
10.0000 mg | ORAL_TABLET | Freq: Every day | ORAL | Status: DC
  Administered 2013-09-11 – 2013-09-12 (×2): 10 mg via ORAL
  Filled 2013-09-11 (×2): qty 1

## 2013-09-11 MED ORDER — MORPHINE SULFATE 4 MG/ML IJ SOLN
4.0000 mg | INTRAMUSCULAR | Status: AC | PRN
  Administered 2013-09-11 (×2): 4 mg via INTRAVENOUS
  Filled 2013-09-11 (×2): qty 1

## 2013-09-11 MED ORDER — SODIUM CHLORIDE 0.9 % IV SOLN
INTRAVENOUS | Status: AC
  Administered 2013-09-11: 15:00:00 via INTRAVENOUS

## 2013-09-11 MED ORDER — LISINOPRIL 20 MG PO TABS
20.0000 mg | ORAL_TABLET | Freq: Every day | ORAL | Status: DC
  Administered 2013-09-11 – 2013-09-12 (×2): 20 mg via ORAL
  Filled 2013-09-11 (×2): qty 1

## 2013-09-11 MED ORDER — METOCLOPRAMIDE HCL 5 MG/ML IJ SOLN
10.0000 mg | Freq: Once | INTRAMUSCULAR | Status: AC
  Administered 2013-09-11: 10 mg via INTRAVENOUS
  Filled 2013-09-11: qty 2

## 2013-09-11 MED ORDER — HYDRALAZINE HCL 25 MG PO TABS
25.0000 mg | ORAL_TABLET | Freq: Three times a day (TID) | ORAL | Status: DC
  Administered 2013-09-11 – 2013-09-12 (×4): 25 mg via ORAL
  Filled 2013-09-11 (×5): qty 1

## 2013-09-11 MED ORDER — PROMETHAZINE HCL 25 MG/ML IJ SOLN
12.5000 mg | Freq: Once | INTRAMUSCULAR | Status: AC
Start: 2013-09-11 — End: 2013-09-11
  Administered 2013-09-11: 12.5 mg via INTRAVENOUS
  Filled 2013-09-11: qty 1

## 2013-09-11 MED ORDER — SALINE SPRAY 0.65 % NA SOLN
1.0000 | NASAL | Status: DC | PRN
  Administered 2013-09-12: 1 via NASAL
  Filled 2013-09-11 (×2): qty 44

## 2013-09-11 MED ORDER — HYDRALAZINE HCL 20 MG/ML IJ SOLN
20.0000 mg | INTRAMUSCULAR | Status: DC | PRN
  Administered 2013-09-11 – 2013-09-12 (×2): 20 mg via INTRAVENOUS
  Filled 2013-09-11 (×2): qty 1

## 2013-09-11 MED ORDER — NICARDIPINE HCL IN NACL 20-0.86 MG/200ML-% IV SOLN
5.0000 mg/h | INTRAVENOUS | Status: DC
  Administered 2013-09-11: 5 mg/h via INTRAVENOUS
  Filled 2013-09-11: qty 200

## 2013-09-11 MED ORDER — HEPARIN SODIUM (PORCINE) 5000 UNIT/ML IJ SOLN
5000.0000 [IU] | Freq: Three times a day (TID) | INTRAMUSCULAR | Status: DC
  Administered 2013-09-11 – 2013-09-12 (×4): 5000 [IU] via SUBCUTANEOUS
  Filled 2013-09-11 (×6): qty 1

## 2013-09-11 MED ORDER — PANTOPRAZOLE SODIUM 40 MG IV SOLR
40.0000 mg | Freq: Once | INTRAVENOUS | Status: AC
  Administered 2013-09-11: 40 mg via INTRAVENOUS
  Filled 2013-09-11 (×2): qty 40

## 2013-09-11 MED ORDER — CYCLOBENZAPRINE HCL 10 MG PO TABS: 5.0000 mg | ORAL_TABLET | Freq: Three times a day (TID) | ORAL | Status: DC | PRN

## 2013-09-11 MED ORDER — SODIUM CHLORIDE 0.9 % IV SOLN: INTRAVENOUS | Status: AC

## 2013-09-11 NOTE — ED Notes (Signed)
Pt taken to room via wheelchair; pt getting undressed and into a gown at this time; Lexine Baton, RN present in room

## 2013-09-11 NOTE — ED Notes (Signed)
Pt continues to yell out this is the worst headache of his life. Pt instructed I am starting blood pressure medication now which we will see if improves his headache.

## 2013-09-11 NOTE — ED Provider Notes (Signed)
CSN: [id number]     Arrival date & time 09/11/13  [id number] History   First MD Initiated Contact with Patient 09/11/13 1005     Chief Complaint  Patient presents with  . Chest Pain  . Shortness of Breath      HPI Pt was seen at 1010. Per pt, c/o gradual onset and worsening of persistent CP and SOB for the past 2 weeks. Pt states his symptoms began after he took amoxicillin for a "sinus infection." Pt also c/o acute flair of his chronic headache and upper abd "pains" for the past 2 weeks. Pt states he has not been taking his BP meds in the last 2 to 3 days because he "ran out of them." Denies palpitations, no cough, no fevers, no rash, no N/V/D, no back pain, no focal motor weakness, no tingling/numbness in extremities.      Past Medical History  Diagnosis Date  . Hypertension     a. Hx of HTN urgency secondary to noncompliance. b. urinary metanephrine and catecholeamine levels normal 2013.  [name] Anxiety   . GERD (gastroesophageal reflux disease)   . Acute renal failure     June 2012 felt secondary to Toradol  . Chronic renal failure   . CHF (congestive heart failure)     a. 05/2011: Adm with pulm edema/HTN urgency, EF 35-40% with diffuse hypokinesis and moderate to severe mitral regurgitation. Cardiomyopathy likely due to uncontrolled HTN and ETOH abuse - cath deferred due to renal insufficiency (felt due to uncontrolled HTN). bJodie Echevaria MV 06/2011: EF 37% and no ischemia or infarction. c. EF 45-50% by echo 01/2012.  . Cardiomyopathy   . HYPERLIPIDEMIA   . INGUINAL HERNIA   . ETOH abuse     a. Reported to have quit 05/2011.  . Valvular heart disease     a. Echo 05/2011: moderate to severe eccentric MR and mild to moderate AI with prolapsing left coronary cusp. b. Echo 01/2012: mild-mod AI, mild dilitation of aortic root, mild MR.  . CKD (chronic kidney disease)     a. 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  . Tobacco abuse   . Pneumonia ~ 2013  . Headache(784.0)     "q other day" (08/08/2013)  .  Migraine     "twice/month" (08/08/2013)  . Chronic sinusitis   . History of medication noncompliance   . Chronic abdominal pain   . DDD (degenerative disc disease), lumbar    Past Surgical History  Procedure Laterality Date  . Ankle surgery      Fractures bilaterally  . Inguinal hernia repair Right ~ 1996  . Foot fracture surgery Bilateral 2004-2010    "got pins in both of them"   Family History  Problem Relation Age of Onset  . Hypertension    . Colon cancer Paternal Uncle    History  Substance Use Topics  . Smoking status: Current Some Day Smoker -- 0.25 packs/day for 23 years    Types: Cigarettes  . Smokeless tobacco: Former Systems developer    Types: Chew  . Alcohol Use: 6.6 oz/week    11 Shots of liquor per week     Comment: 08/08/2013 "used to drink 1 pint of gin a week, now drink ~ 1 pint gin/month"    Review of Systems ROS: Statement: All systems negative except as marked or noted in the HPI; Constitutional: Negative for fever and chills. ; ; Eyes: Negative for eye pain, redness and discharge. ; ; ENMT: Negative for ear pain, hoarseness, sore throat. +nasal  congestion, sinus pressure. ; ; Cardiovascular: +CP, SOB. Negative for palpitations, diaphoresis and peripheral edema. ; ; Respiratory: Negative for cough, wheezing and stridor. ; ; Gastrointestinal: +chronic abd pain. Negative for nausea, vomiting, diarrhea, blood in stool, hematemesis, jaundice and rectal bleeding. . ; ; Genitourinary: Negative for dysuria, flank pain and hematuria. ; ; Musculoskeletal: Negative for back pain and neck pain. Negative for swelling and trauma.; ; Skin: Negative for pruritus, rash, abrasions, blisters, bruising and skin lesion.; ; Neuro: Negative for headache, lightheadedness and neck stiffness. Negative for weakness, altered level of consciousness , altered mental status, extremity weakness, paresthesias, involuntary movement, seizure and syncope.      Allergies  Imdur  Home Medications   Prior  to Admission medications   Medication Sig Start Date End Date Taking? Authorizing Provider  amLODipine (NORVASC) 10 MG tablet Take 10 mg by mouth daily.   Yes Historical Provider, MD  carvedilol (COREG) 25 MG tablet Take 1 tablet (25 mg total) by mouth 2 (two) times daily. 05/06/13  Yes Coral Spikes, DO  cyclobenzaprine (FLEXERIL) 5 MG tablet Take 1 tablet (5 mg total) by mouth 3 (three) times daily as needed for muscle spasms (please give 1st dose now). 05/06/13  Yes Waldemar Dickens, MD  furosemide (LASIX) 20 MG tablet Take 1 tablet (20 mg total) by mouth daily as needed for fluid or edema (shortness of breath). 05/06/13  Yes Waldemar Dickens, MD  hydrALAZINE (APRESOLINE) 25 MG tablet Take 25 mg by mouth 3 (three) times daily.   Yes Historical Provider, MD  lisinopril (PRINIVIL,ZESTRIL) 20 MG tablet Take 20 mg by mouth daily.   Yes Historical Provider, MD   BP 186/133  Pulse 121  Temp(Src) 98.7 F (37.1 C)  Resp 17  Ht 6' (1.829 m)  SpO2 100% Filed Vitals:   09/11/13 0957 09/11/13 1004 09/11/13 1047  BP: 195/133 210/141 186/133  Pulse: 93  121  Temp: 98.7 F (37.1 C)    Resp: 17    Height: 6' (1.829 m)    SpO2: 99%  100%    Physical Exam 1015; Physical examination:  Nursing notes reviewed; Vital signs and O2 SAT reviewed;  Constitutional: Well developed, Well nourished, Well hydrated, In no acute distress; Head:  Normocephalic, atraumatic; Eyes: EOMI, PERRL, No scleral icterus; ENMT: Mouth and pharynx normal, Mucous membranes moist; Neck: Supple, Full range of motion, No lymphadenopathy; Cardiovascular: Tachycardic rate and rhythm, No gallop; Respiratory: Breath sounds coarse & equal bilaterally, No wheezes.  Speaking full sentences with ease, Normal respiratory effort/excursion; Chest: Nontender, Movement normal; Abdomen: Soft, +LUQ tenderness to palp. No rebound or guarding. Nondistended, Normal bowel sounds; Genitourinary: No CVA tenderness; Extremities: Pulses normal, No tenderness, No  edema, No calf edema or asymmetry.; Neuro: AA&Ox3, Major CN grossly intact. No facial droop. Speech clear. No gross focal motor or sensory deficits in extremities.; Skin: Color normal, Warm, Dry.   ED Course  Procedures     MDM  MDM Reviewed: previous chart, nursing note and vitals Reviewed previous: labs and ECG Interpretation: labs, ECG and x-ray Total time providing critical care: 30-74 minutes. This excludes time spent performing separately reportable procedures and services. Consults: admitting MD   CRITICAL CARE Performed by: Alfonzo Feller Total critical care time: 35 Critical care time was exclusive of separately billable procedures and treating other patients. Critical care was necessary to treat or prevent imminent or life-threatening deterioration. Critical care was time spent personally by me on the following activities: development of treatment plan  with patient and/or surrogate as well as nursing, discussions with consultants, evaluation of patient's response to treatment, examination of patient, obtaining history from patient or surrogate, ordering and performing treatments and interventions, ordering and review of laboratory studies, ordering and review of radiographic studies, pulse oximetry and re-evaluation of patient's condition.    Date: October 08, 2013 on arrival  Rate: 102  Rhythm: sinus tachycardia  QRS Axis: normal  Intervals: normal  ST/T Wave abnormalities: nonspecific ST/T changes/TWA lateral leads  Conduction Disutrbances:none  Narrative Interpretation:   Old EKG Reviewed: changes noted; compared to previous EKG dated 08/08/2013: QT has shortened, TWA lateral leads are less pronounced.   Date: 10-08-13 repeated  Rate: 110  Rhythm: sinus tachycardia  QRS Axis: normal  Intervals: normal  ST/T Wave abnormalities: nonspecific ST/T changes/TWA lateral leads  Conduction Disutrbances:none  Narrative Interpretation:   Old EKG Reviewed: unchanged from  previous EKG completed today.     Results for orders placed during the hospital encounter of October 08, 2013  TROPONIN I      Result Value Ref Range   Troponin I <0.30  <0.30 ng/mL  PRO B NATRIURETIC PEPTIDE      Result Value Ref Range   Pro B Natriuretic peptide (BNP) 1981.0 (*) 0 - 125 pg/mL  COMPREHENSIVE METABOLIC PANEL      Result Value Ref Range   Sodium 139  137 - 147 mEq/L   Potassium 4.0  3.7 - 5.3 mEq/L   Chloride 100  96 - 112 mEq/L   CO2 21  19 - 32 mEq/L   Glucose, Bld 87  70 - 99 mg/dL   BUN 12  6 - 23 mg/dL   Creatinine, Ser 1.61 (*) 0.50 - 1.35 mg/dL   Calcium 9.3  8.4 - 10.5 mg/dL   Total Protein 7.4  6.0 - 8.3 g/dL   Albumin 3.8  3.5 - 5.2 g/dL   AST 14  0 - 37 U/L   ALT 9  0 - 53 U/L   Alkaline Phosphatase 103  39 - 117 U/L   Total Bilirubin 0.6  0.3 - 1.2 mg/dL   GFR calc non Af Amer 52 (*) >90 mL/min   GFR calc Af Amer 60 (*) >90 mL/min  ETHANOL      Result Value Ref Range   Alcohol, Ethyl (B) <11  0 - 11 mg/dL   Dg Chest 2 View 08-Oct-2013   CLINICAL DATA:  Chest pain and shortness of breath.  EXAM: CHEST  2 VIEW  COMPARISON:  PA and lateral chest and CT chest 09/19/2012. PA and lateral chest 08/08/2013.  FINDINGS: There is cardiomegaly without edema. Ectatic aorta is identified. Lungs are clear. No pneumothorax or pleural effusion.  IMPRESSION: Cardiomegaly without acute disease.  Stable compared to prior exam.   Electronically Signed   By: Inge Rise M.D.   On: October 08, 2013 11:02   Results for [name], [name] (MRN [id number]) as of 2013-10-08 11:31  Ref. Range 05/06/2013 03:43 08/08/2013 11:45 08/09/2013 02:38 10-08-13 10:20  BUN Latest Range: 6-23 mg/dL 13 12 9 12   Creatinine Latest Range: 0.50-1.35 mg/dL 1.52 (H) 1.53 (H) 1.34 1.61 (H)     1120:  IV Hydralazine given without sustained improvement in BP. IV cardizem gtt started. No CHF on CXR. BUN/Cr elevated from baseline. Dx and testing d/w pt.  Questions answered.  Verb understanding, agreeable to admit.   T/C to IM Resident, case discussed, including:  HPI, pertinent PM/SHx, VS/PE, dx testing, ED course and treatment:  Agreeable to  admit, requests to complete temporary orders, obtain stepdown bed, Dr. Delfino Lovett service.   1200:  IV cardene started. Pt continues to c/o CP and SOB. Now also c/o nausea. Will dose IV pain and nausea meds. EKG repeated: no acute changes.      Alfonzo Feller, DO 09/14/13 [phone]

## 2013-09-11 NOTE — ED Notes (Signed)
Pts wife out to RN station to notify this RN something wrong with her husband. Pt shouting he can't breathe. Diaphoretic, vomiting in room. IV cardene stopped. Dr. Thurnell Garbe aware and at bedside. Pt dry heaving with O2 sats 100% on RA. New orders received. At bedside with patient with cool wash clothes. Pharmacy called to send phenergan still waiting.

## 2013-09-11 NOTE — ED Notes (Signed)
Pt. Stated, i was treated for a sinus infection and then 2 weeks ago i started having chest pain and SOB.  Im also having a bad headache.

## 2013-09-11 NOTE — H&P (Signed)
Date: 09/11/2013               Patient Name:  [name] MRN: [id number]  DOB: June 27, 1970 Age / Sex: 43 y.o., male   PCP: [name], MD         Medical Service: Internal Medicine Teaching Service         Attending Physician: Dr. Madilyn Fireman, MD    First Contact: Dr. Denton Brick Pager: [phone]  Second Contact: Dr. Eula Fried Pager: [phone]       After Hours (After 5p/  First Contact Pager: [phone]  weekends / holidays): Second Contact Pager: [phone]   Chief Complaint: Headaches, abdominal pain, chest pain and SOB, and elevated Bp.   History of Present Illness: 83 Y O Male with PMH of HTN- previous admissions for HTN Emergency, systolic CHF- EF- [id number], presented with c/o of headaches and sinus congestion. Pt says she has been having headaches for the past 2 weeks, generalized, not particularly severe, but increased in severity in the Ed, after he was given a medication- Cardene drip. Pt says that now he has neck pain, photophobia, and that this is the worst headaches he had ever experienced. No fever. Pt says that since his last ED visit in May, when he was given a course of antibiotic- Augmentin 875 mg #14 by mouth twice a day x7 days but his congestion and right sided facial pain never resolved, with Head Ct Wo contrast on that day with feats consistent with sinusitis.  Pt also complained of chest pain- right sided, for the past 2 weeks, but on further questioning pt says its been present for years.  Described as pressure like, says it is associated with right sided arm pain, and SOB. Chest pain is unrelated to activity. And is relieved by Nitroglycerin. Pt says he follows with Dr. Aundra Dubin, but as per chart review pt has not seen his cardiologist since June 2013. Pt smokes 4-6 cig a day, and has done so for the past 22 years. Denies use of illicit drugs. Pt also complained of periumbilical pain, radiating to the left. Last bowel movement- today, normal, no blood. Also has some  episoes of vomiting today before he came to the hospital. With normal appetite.  In the Ed, pt blood pressure was found to be markedly elevated- 210/141. Pt says he ran out of his blood pressure meds- 2 days ago, but otherwise he has been very complaint with his meds. No vision changes. Denies back pain, pain between his shoulder blades, but says she has some pain over his right scapular.  Meds: Current Facility-Administered Medications  Medication Dose Route Frequency Provider Last Rate Last Dose  . 0.9 %  sodium chloride infusion   Intravenous STAT Alfonzo Feller, DO 50 mL/hr at 09/11/13 1440    . fentaNYL (SUBLIMAZE) injection 50 mcg  50 mcg Intravenous Q30 min PRN Alfonzo Feller, DO   50 mcg at 09/11/13 1055  . hydrALAZINE (APRESOLINE) injection 20 mg  20 mg Intravenous Q4H PRN Jeralene Huff, MD      . morphine 4 MG/ML injection 4 mg  4 mg Intravenous Q1H PRN Alfonzo Feller, DO   4 mg at 09/11/13 1440  . niCARdipine (CARDENE-IV) infusion (0.1 mg/ml)  5 mg/hr Intravenous Continuous Alfonzo Feller, DO 50 mL/hr at 09/11/13 1147 5 mg/hr at 09/11/13 1147    Allergies: Allergies as of 09/11/2013 - Review Complete 09/11/2013  Allergen Reaction Noted  . Imdur [isosorbide] Other (See  Comments) 09/19/2012   Past Medical History  Diagnosis Date  . Hypertension     a. Hx of HTN urgency secondary to noncompliance. b. urinary metanephrine and catecholeamine levels normal 2013.  [name] Anxiety   . GERD (gastroesophageal reflux disease)   . Acute renal failure     June 2012 felt secondary to Toradol  . Chronic renal failure   . CHF (congestive heart failure)     a. 05/2011: Adm with pulm edema/HTN urgency, EF 35-40% with diffuse hypokinesis and moderate to severe mitral regurgitation. Cardiomyopathy likely due to uncontrolled HTN and ETOH abuse - cath deferred due to renal insufficiency (felt due to uncontrolled HTN). bJodie Echevaria MV 06/2011: EF 37% and no ischemia or infarction. c.  EF 45-50% by echo 01/2012.  . Cardiomyopathy   . HYPERLIPIDEMIA   . INGUINAL HERNIA   . ETOH abuse     a. Reported to have quit 05/2011.  . Valvular heart disease     a. Echo 05/2011: moderate to severe eccentric MR and mild to moderate AI with prolapsing left coronary cusp. b. Echo 01/2012: mild-mod AI, mild dilitation of aortic root, mild MR.  . CKD (chronic kidney disease)     a. 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  . Tobacco abuse   . Pneumonia ~ 2013  . Headache(784.0)     "q other day" (08/08/2013)  . Migraine     "twice/month" (08/08/2013)  . Chronic sinusitis   . History of medication noncompliance   . Chronic abdominal pain   . DDD (degenerative disc disease), lumbar    Past Surgical History  Procedure Laterality Date  . Ankle surgery      Fractures bilaterally  . Inguinal hernia repair Right ~ 1996  . Foot fracture surgery Bilateral 2004-2010    "got pins in both of them"   Family History  Problem Relation Age of Onset  . Hypertension    . Colon cancer Paternal Uncle    History   Social History  . Marital Status: Married    Spouse Name: N/A    Number of Children: 49  . Years of Education: N/A   Occupational History  . Disabled, part-time Programmer, systems     Disability   Social History Main Topics  . Smoking status: Current Some Day Smoker -- 0.25 packs/day for 23 years    Types: Cigarettes  . Smokeless tobacco: Former Systems developer    Types: Chew  . Alcohol Use: 6.6 oz/week    11 Shots of liquor per week     Comment: 08/08/2013 "used to drink 1 pint of gin a week, now drink ~ 1 pint gin/month"  . Drug Use: Yes    Special: Marijuana     Comment: 08/08/2013 "twice a month"  . Sexual Activity: Yes   Other Topics Concern  . Not on file   Social History Narrative  . No narrative on file    Review of Systems: CONSTITUTIONAL- No Fever, night sweat or change in appetite. SKIN- No Rash, colour changes or itching. HEAD- No Dizziness. EYES- No Vision loss, pain, redness,  double or blurred vision. RESPIRATORY- No Cough, occasional SOB, no change in weight, no pedal edema, stable 3 pillow orthopnea. CARDIAC- No Palpitations, has chest pain. URINARY- No Frequency, urgency, straining or dysuria. NEUROLOGIC- No Numbness, syncope, seizures or burning. [facility]- Denies depression or anxiety.  Physical Exam: Blood pressure 196/125, pulse 95, temperature 98.7 F (37.1 C), resp. rate 22, height 6' (1.829 m), SpO2 97.00%. GENERAL- alert, co-operative, appears  as stated age, not in any distress. HEENT- Atraumatic, normocephalic, PERRL, EOMI, oral mucosa appears moist, patient says he is having pain on flexing his neck.  CARDIAC- Tachycardic, 3/6 murmur heard- mitral area, rubs or gallops. RESP- Moving equal volumes of air, and clear to auscultation bilaterally, no wheezes or crackles. ABDOMEN- Soft, nontender, no guarding or rebound, no palpable masses or organomegaly, bowel sounds present. BACK- Normal curvature of the spine, No tenderness along the vertebrae, no CVA tenderness. NEURO- Alert and oriented X3, No obvious Cr N abnormality, normal strenght upper and lower extremities- 5/5, Gait- Normal. EXTREMITIES- pulse 2+, symmetric, no pedal edema. SKIN- Warm, dry, No rash or lesion. PSYCH- Normal mood and affect, appropriate thought content and speech.  Lab results: Basic Metabolic Panel:  Recent Labs  09/11/13 1020  NA 139  K 4.0  CL 100  CO2 21  GLUCOSE 87  BUN 12  CREATININE 1.61*  CALCIUM 9.3   Liver Function Tests:  Recent Labs  09/11/13 1020  AST 14  ALT 9  ALKPHOS 103  BILITOT 0.6  PROT 7.4  ALBUMIN 3.8   Cardiac Enzymes:  Recent Labs  09/11/13 1020  TROPONINI <0.30   BNP:  Recent Labs  09/11/13 1020  PROBNP 1981.0*   Urine Drug Screen: Drugs of Abuse     Component Value Date/Time   LABOPIA POSITIVE* 08/08/2013 2036   LABOPIA NEG 01/16/2009 2229   COCAINSCRNUR NONE DETECTED 08/08/2013 2036   COCAINSCRNUR NEG 01/16/2009 2229     LABBENZ NONE DETECTED 08/08/2013 2036   LABBENZ NEG 01/16/2009 2229   AMPHETMU NONE DETECTED 08/08/2013 2036   AMPHETMU NEG 01/16/2009 2229   THCU POSITIVE* 08/08/2013 2036   LABBARB NONE DETECTED 08/08/2013 2036    Alcohol Level:  Recent Labs  09/11/13 1020  ETH <11   Urinalysis: No results found for this basename: COLORURINE, APPERANCEUR, LABSPEC, PHURINE, GLUCOSEU, HGBUR, BILIRUBINUR, KETONESUR, PROTEINUR, UROBILINOGEN, NITRITE, LEUKOCYTESUR,  in the last 72 hours  Imaging results:  Dg Chest 2 View  09/11/2013   CLINICAL DATA:  Chest pain and shortness of breath.  EXAM: CHEST  2 VIEW  COMPARISON:  PA and lateral chest and CT chest 09/19/2012. PA and lateral chest 08/08/2013.  FINDINGS: There is cardiomegaly without edema. Ectatic aorta is identified. Lungs are clear. No pneumothorax or pleural effusion.  IMPRESSION: Cardiomegaly without acute disease.  Stable compared to prior exam.   Electronically Signed   By: Inge Rise M.D.   On: 09/11/2013 11:02   Ct Head Wo Contrast  09/11/2013   CLINICAL DATA:  Worsened headache  EXAM: CT HEAD WITHOUT CONTRAST  TECHNIQUE: Contiguous axial images were obtained from the base of the skull through the vertex without intravenous contrast.  COMPARISON:  08/08/2013, 09/09/2010  FINDINGS: There is no evidence of mass effect, midline shift or extra-axial fluid collections. There is no evidence of a space-occupying lesion or intracranial hemorrhage. There is no evidence of a cortical-based area of acute infarction.  The ventricles and sulci are appropriate for the patient's age. The basal cisterns are patent.  Visualized portions of the orbits are unremarkable. Bilateral ethmoid sinus mucosal thickening. Complete opacification of the visualized right maxillary sinus with low attenuation extending into the right nasal passage which may represent a large mucous retention cyst which is similar to the prior exams.  The osseous structures are unremarkable.   IMPRESSION: 1.   No acute intracranial pathology.  2. Bilateral ethmoid sinus mucosal thickening. Complete opacification of the visualized right maxillary sinus  with low attenuation extending into the right nasal passage which may represent a large mucous retention cyst which is similar to the prior exams.   Electronically Signed   By: Kathreen Devoid   On: 09/11/2013 16:22    Other results: EKG: rate- 110 bpm, regular rate, sinus, enlarged P waves- II, III, AVF, V1- Biatrial enlargement, normal PR interval, Q waves V1, AVL Normal QRS duration, no St segment abnormalities, large T waves- V3, V4. No significantr change from prior.  Compared to prior- 26th May- 2015  Assessment & Plan by Problem:  Hypertensive urgency- markedly elevated blood pressure from medication non-complaince. No signs of end-organ damage. Similar Ed visit in May, with elevated Bp. Was given refill of Bp meds, which he ran out of. Doesnt have a PCP. Pt was given IV Hydralazine- 20mg  without appreciable response and then started on Nicardipine drip. Complaints of headache been the worst he has experienced concerning for [facility], together with nausea and vomiting, with photophobia, neck pain and stiffness. No fever or leukocytosis- making meningitis unlikely. - Admit to step down.  - Head CT start - IV hydralazine- 20mg  start, then resume home Bp meds- Norvasc- 10mg  daily, Coreg- 25mg  daily, Lisinopril- 20mg  daily, hydralazine tablet- 25mg  TID. - UDS - Migraine cocktail- Toradol, metoclopramide and benadryl.  Chest pain-  Chronic. Unchanged from prior. Relieved by NTG. TIMI- 0, 5% risk at 14 days. Unlikely aortic dissection in the setting of elevated BP, without typical back pain, normal chest xray. Pt was given IV protonix 40mg  in the Ed. - UDS - Trops X3 - EKG in the Am. - Blood pressure in both arms.  Sinusitis- Failed treatment with Augmentin, despite compliance. Ct scan- 08/08/2013- Opacification of the right maxillary sinus with  mild mucosal thickening in the ethmoid sinuses and mucosal thickening noted in the right nasopharynx. These findings are most consistent with sinusitis. Follow-up imaging is suggested to exclude a nasopharyngeal or sinus mass. - Nasal saline - Levaquin-  500mg  daily for 7 days. - Outpt follow up for re-eval after resolution of infection To rule out  Mass  Abdominal pain- Chronic, without fever or leukocytosis. Pt also reported seeing some blood, has a hx of hemorrhoids, which he says resolved. Concern for diverticulosis, no colonoscopy on file. Hgb stable- 13-14. Had abd xray- 08/09/2013- Paucity of bowel gas in RLQ, scattered large and small bowel gas noted. Last abdominal Ct- 01/2012- Unremarkable except for Cardiomegaly and interstitial edema. LFTs on this admission normal. - Lipase done in the Ed- normal - Lactic acid  - FOBT - UA  Systolic CHF- Last [id number]- EF- 40-45%, with diffuse hypokinesis, with mitral regurg. Home meds- Frusemide- 20mg  daily. Followed with Dr. Aundra Dubin- last visit note- 2013. Pro-BNP elevteted in the setting of CKD 3- 4, but CHF appears stable.   Increased Anion gap- 18, with normal bicarb- 21. Likely from chronic renal disease/uremia- CKD-  3-4. Cr- 1.52 Alcohol level- WNL. - Bmet in the am  Dispo: Disposition is deferred at this time, awaiting improvement of current medical problems.   The patient does have a current PCP [name], MD) and does need an [facility] hospital follow-up appointment after discharge.  The patient does have transportation limitations that hinder transportation to clinic appointments.  Signed: Jenetta Downer, MD 09/11/2013, 2:41 PM

## 2013-09-12 DIAGNOSIS — R079 Chest pain, unspecified: Secondary | ICD-10-CM

## 2013-09-12 DIAGNOSIS — I509 Heart failure, unspecified: Secondary | ICD-10-CM

## 2013-09-12 DIAGNOSIS — I5020 Unspecified systolic (congestive) heart failure: Secondary | ICD-10-CM

## 2013-09-12 DIAGNOSIS — I10 Essential (primary) hypertension: Secondary | ICD-10-CM

## 2013-09-12 DIAGNOSIS — I428 Other cardiomyopathies: Secondary | ICD-10-CM | POA: Diagnosis not present

## 2013-09-12 DIAGNOSIS — I129 Hypertensive chronic kidney disease with stage 1 through stage 4 chronic kidney disease, or unspecified chronic kidney disease: Secondary | ICD-10-CM | POA: Diagnosis not present

## 2013-09-12 DIAGNOSIS — J329 Chronic sinusitis, unspecified: Secondary | ICD-10-CM

## 2013-09-12 LAB — CBC
HCT: 39.5 % (ref 39.0–52.0)
HEMOGLOBIN: 13.5 g/dL (ref 13.0–17.0)
MCH: 31.7 pg (ref 26.0–34.0)
MCHC: 34.2 g/dL (ref 30.0–36.0)
MCV: 92.7 fL (ref 78.0–100.0)
Platelets: 163 10*3/uL (ref 150–400)
RBC: 4.26 MIL/uL (ref 4.22–5.81)
RDW: 14.6 % (ref 11.5–15.5)
WBC: 5.8 10*3/uL (ref 4.0–10.5)

## 2013-09-12 LAB — URINALYSIS, ROUTINE W REFLEX MICROSCOPIC
BILIRUBIN URINE: NEGATIVE
GLUCOSE, UA: NEGATIVE mg/dL
HGB URINE DIPSTICK: NEGATIVE
Ketones, ur: NEGATIVE mg/dL
Leukocytes, UA: NEGATIVE
Nitrite: NEGATIVE
PROTEIN: 30 mg/dL — AB
SPECIFIC GRAVITY, URINE: 1.015 (ref 1.005–1.030)
Urobilinogen, UA: 0.2 mg/dL (ref 0.0–1.0)
pH: 5.5 (ref 5.0–8.0)

## 2013-09-12 LAB — URINE MICROSCOPIC-ADD ON

## 2013-09-12 LAB — BASIC METABOLIC PANEL
BUN: 14 mg/dL (ref 6–23)
CO2: 18 meq/L — AB (ref 19–32)
Calcium: 8.9 mg/dL (ref 8.4–10.5)
Chloride: 100 mEq/L (ref 96–112)
Creatinine, Ser: 1.53 mg/dL — ABNORMAL HIGH (ref 0.50–1.35)
GFR calc Af Amer: 64 mL/min — ABNORMAL LOW (ref >90)
GFR calc non Af Amer: 55 mL/min — ABNORMAL LOW (ref >90)
GLUCOSE: 86 mg/dL (ref 70–99)
POTASSIUM: 3.9 meq/L (ref 3.7–5.3)
Sodium: 135 mEq/L — ABNORMAL LOW (ref 137–147)

## 2013-09-12 MED ORDER — PREDNISONE (PAK) 10 MG PO TABS: ORAL_TABLET | Freq: Every day | ORAL | Status: DC

## 2013-09-12 MED ORDER — CARVEDILOL 25 MG PO TABS: 25.0000 mg | ORAL_TABLET | Freq: Two times a day (BID) | ORAL | Status: DC

## 2013-09-12 MED ORDER — AMLODIPINE BESYLATE 10 MG PO TABS: 10.0000 mg | ORAL_TABLET | Freq: Every day | ORAL | Status: DC

## 2013-09-12 MED ORDER — KETOROLAC TROMETHAMINE 30 MG/ML IJ SOLN
30.0000 mg | Freq: Once | INTRAMUSCULAR | Status: AC
  Administered 2013-09-12: 30 mg via INTRAVENOUS
  Filled 2013-09-12: qty 1

## 2013-09-12 MED ORDER — MORPHINE SULFATE 2 MG/ML IJ SOLN
2.0000 mg | INTRAMUSCULAR | Status: DC | PRN
  Administered 2013-09-12: 2 mg via INTRAVENOUS
  Filled 2013-09-12: qty 1

## 2013-09-12 MED ORDER — LEVOFLOXACIN 500 MG PO TABS: 500.0000 mg | ORAL_TABLET | Freq: Every day | ORAL | Status: DC

## 2013-09-12 MED ORDER — MORPHINE SULFATE 2 MG/ML IJ SOLN
2.0000 mg | Freq: Once | INTRAMUSCULAR | Status: AC
  Administered 2013-09-12: 2 mg via INTRAVENOUS
  Filled 2013-09-12: qty 1

## 2013-09-12 MED ORDER — HYDRALAZINE HCL 25 MG PO TABS: 25.0000 mg | ORAL_TABLET | Freq: Three times a day (TID) | ORAL | Status: DC

## 2013-09-12 MED ORDER — LISINOPRIL 20 MG PO TABS: 20.0000 mg | ORAL_TABLET | Freq: Every day | ORAL | Status: DC

## 2013-09-12 MED ORDER — FUROSEMIDE 20 MG PO TABS: 20.0000 mg | ORAL_TABLET | Freq: Every day | ORAL | Status: DC | PRN

## 2013-09-12 MED ORDER — TRAMADOL HCL 50 MG PO TABS
100.0000 mg | ORAL_TABLET | Freq: Two times a day (BID) | ORAL | Status: DC | PRN
Start: 2013-09-12 — End: 2013-12-04

## 2013-09-12 NOTE — Progress Notes (Signed)
Offered Pt. A bath, Pt. Stated he will do bath after wife brings him some supplies from home. Bath supplies given to Pt.

## 2013-09-12 NOTE — Discharge Summary (Signed)
Name: [name] MRN: [id number] DOB: 1970-09-13 43 y.o. PCP: [name], MD  Date of Admission: 09/11/2013  9:58 AM Date of Discharge: 09/12/2013 Attending Physician: No att. providers found  Discharge Diagnosis:  Active Problems:   Hypertensive crisis   Sinusitis, bacterial  Discharge Medications:   Medication List         amLODipine 10 MG tablet  Commonly known as:  NORVASC  Take 1 tablet (10 mg total) by mouth daily.              carvedilol 25 MG tablet  Commonly known as:  COREG  Take 1 tablet (25 mg total) by mouth 2 (two) times daily.              cyclobenzaprine 5 MG tablet  Commonly known as:  FLEXERIL  Take 1 tablet (5 mg total) by mouth 3 (three) times daily as needed for muscle spasms (please give 1st dose now).     furosemide 20 MG tablet  Commonly known as:  LASIX  Take 1 tablet (20 mg total) by mouth daily as needed for fluid or edema (shortness of breath).              hydrALAZINE 25 MG tablet  Commonly known as:  APRESOLINE  Take 1 tablet (25 mg total) by mouth 3 (three) times daily.              levofloxacin 500 MG tablet  Commonly known as:  LEVAQUIN  Take 1 tablet (500 mg total) by mouth daily.              lisinopril 20 MG tablet  Commonly known as:  PRINIVIL,ZESTRIL  Take 20 mg by mouth daily.     predniSONE 10 MG tablet  Commonly known as:  STERAPRED UNI-PAK  Take by mouth daily. For 6 days.1st day- 60mg . 2nd day- 50mg , 3rd- 40mg , 4th- 30mg , 5th-20mg , 6th-10mg      traMADol 50 MG tablet  Commonly known as:  ULTRAM  Take 2 tablets (100 mg total) by mouth every 12 (twelve) hours as needed.        Disposition and follow-up:   Mr.[name] was discharged from [facility] in Good condition.  At the hospital follow up visit please address:  1.  Better blood pressure control? Pt compliant? EKG to check QTc on follow up in clinic. Pt reminded to keep ENT appointment already arranged.  2.  Labs  / imaging needed at time of follow-up: None  3.  Pending labs/ test needing follow-up: None.  Follow-up Appointments: Follow-up Information   Follow up with Charlott Rakes, MD On 09/20/2013. (At 1.30pm)    Specialty:  Internal Medicine   Contact information:   [address] [place] [address] [phone]       Follow up with Ascencion Dike, MD On 09/27/2013. (At 3pm.)    Specialty:  Otolaryngology   Contact information:   [address] [place] [address] [phone]       Discharge Instructions: Discharge Instructions   Diet - low sodium heart healthy    Complete by:  As directed      Discharge instructions    Complete by:  As directed   We have set up an appointment for you to follow up with [name] in clinic. Complete 7 days of levaquin for your sinusitis- take 1 tablet once daily. Also take the prednsione as prescribed- 60mg  on day 1, 50mg  on  day 2, 40mg  on day 3, 30mg  on day 4, 20mg  on day 5, and 10mg  on the 6th day. Then stop.  When you follow up with [name] in clinic we will refer you to an ENT doctor for your Sinusitis, to prevent recurrence.   We have prescribed tramadol for your headache. Take 2 tablets as needed for headache twice a day.  It is importanat you keep your appointment with [name], then you will be reffered for follow up an ENT doctor- Ear, Nose and throat doctor.     Increase activity slowly    Complete by:  As directed            Consultations:   None.  Procedures Performed:  Dg Chest 2 View  09/11/2013   CLINICAL DATA:  Chest pain and shortness of breath.  EXAM: CHEST  2 VIEW  COMPARISON:  PA and lateral chest and CT chest 09/19/2012. PA and lateral chest 08/08/2013.  FINDINGS: There is cardiomegaly without edema. Ectatic aorta is identified. Lungs are clear. No pneumothorax or pleural effusion.  IMPRESSION: Cardiomegaly without acute disease.  Stable compared to prior exam.   Electronically Signed   By: Inge Rise M.D.   On: 09/11/2013  11:02   Ct Head Wo Contrast  09/11/2013   CLINICAL DATA:  Worsened headache  EXAM: CT HEAD WITHOUT CONTRAST  TECHNIQUE: Contiguous axial images were obtained from the base of the skull through the vertex without intravenous contrast.  COMPARISON:  08/08/2013, 09/09/2010  FINDINGS: There is no evidence of mass effect, midline shift or extra-axial fluid collections. There is no evidence of a space-occupying lesion or intracranial hemorrhage. There is no evidence of a cortical-based area of acute infarction.  The ventricles and sulci are appropriate for the patient's age. The basal cisterns are patent.  Visualized portions of the orbits are unremarkable. Bilateral ethmoid sinus mucosal thickening. Complete opacification of the visualized right maxillary sinus with low attenuation extending into the right nasal passage which may represent a large mucous retention cyst which is similar to the prior exams.  The osseous structures are unremarkable.  IMPRESSION: 1.   No acute intracranial pathology.  2. Bilateral ethmoid sinus mucosal thickening. Complete opacification of the visualized right maxillary sinus with low attenuation extending into the right nasal passage which may represent a large mucous retention cyst which is similar to the prior exams.   Electronically Signed   By: Kathreen Devoid   On: 09/11/2013 16:22    2D Echo: None  Cardiac Cath: None  Admission HPI: Chief Complaint: Headaches, abdominal pain, chest pain and SOB, and elevated Bp.   History of Present Illness: 44 Y O Male with PMH of HTN- previous admissions for HTN Emergency, systolic CHF- EF- [id number], presented with c/o of headaches and sinus congestion. Pt says she has been having headaches for the past 2 weeks, generalized, not particularly severe, but increased in severity in the Ed, after he was given a medication- Cardene drip. Pt says that now he has neck pain, photophobia, and that this is the worst headaches he had ever  experienced. No fever. Pt says that since his last ED visit in May, when he was given a course of antibiotic- Augmentin 875 mg #14 by mouth twice a day x7 days but his congestion and right sided facial pain never resolved, with Head Ct Wo contrast on that day with feats consistent with sinusitis.  Pt also complained of chest pain- right sided, for the past 2 weeks,  but on further questioning pt says its been present for years. Described as pressure like, says it is associated with right sided arm pain, and SOB. Chest pain is unrelated to activity. And is relieved by Nitroglycerin. Pt says he follows with Dr. Aundra Dubin, but as per chart review pt has not seen his cardiologist since June 2013. Pt smokes 4-6 cig a day, and has done so for the past 22 years. Denies use of illicit drugs.  Pt also complained of periumbilical pain, radiating to the left. Last bowel movement- today, normal, no blood. Also has some episoes of vomiting today before he came to the hospital. With normal appetite.  In the Ed, pt blood pressure was found to be markedly elevated- 210/141. Pt says he ran out of his blood pressure meds- 2 days ago, but otherwise he has been very complaint with his meds. No vision changes. Denies back pain, pain between his shoulder blades, but says she has some pain over his right scapular.  Physical Exam:  Blood pressure 196/125, pulse 95, temperature 98.7 F (37.1 C), resp. rate 22, height 6' (1.829 m), SpO2 97.00%.  GENERAL- alert, co-operative, appears as stated age, not in any distress.  HEENT- Atraumatic, normocephalic, PERRL, EOMI, oral mucosa appears moist, patient says he is having pain on flexing his neck.  CARDIAC- Tachycardic, 3/6 murmur heard- mitral area, rubs or gallops.  RESP- Moving equal volumes of air, and clear to auscultation bilaterally, no wheezes or crackles.  ABDOMEN- Soft, nontender, no guarding or rebound, no palpable masses or organomegaly, bowel sounds present.  BACK- Normal  curvature of the spine, No tenderness along the vertebrae, no CVA tenderness.  NEURO- Alert and oriented X3, No obvious Cr N abnormality, normal strenght upper and lower extremities- 5/5, Gait- Normal.  EXTREMITIES- pulse 2+, symmetric, no pedal edema.  SKIN- Warm, dry, No rash or lesion.  PSYCH- Normal mood and affect, appropriate thought content and speech.  Hospital Course by problem list:  Hypertensive urgency- Bp markedly elevated on admission from medication non-complaince. With complainst of worst headache experienced with neck pain and photophobia, stat head Ct was done, which was negative for acute abnormality. UDS was positive for opiates (Pt given pain meds prior) and THC. Pt was admitted to step down, Bp was controlled with IV hydralazine 20mg  X2, and then Pt BP meds (Norvasc- 10mg  daily, Coreg- 25mg  daily, Lisinopril- 20mg  daily, hydralazine tablet- 25mg  TID) were restarted with good BP response. Pt was given Migraine cocktail for headache ( Toradol, metoclopramide and benadryl), fentanyl , but only Morphine IV helped relieve headache. Pt was discharged home to continue with home BP meds, with Tramadol for headache, and close follow up with [facility].    Chest pain- This is Chronic, Unchanged from prior. TIMI- 0, 5% risk at 14 days. Trops X3, negative, with EKG without concerning abnormalities. No concern for Aortic dissection in the setting of elevated BP, without typical back pain, similar BP readings in both arms, normal chest xray. To follow up on discharge.  Sinusitis- Pt was previously prescribed a 7 day course of Augmentin 875 Po BID (08/09/2013) which he took, but with persistent symptoms. Head Ct done on this admission, showed a mucous retention cyst. Pt was started on levaquin 500mg  daily for 7 days. On Phone consult with ENT, recs Cont levaquin, prednisone dose pack, 60mg  start and then taper by 10mg  daily, total of 6 days, and to follow up with ENT. Also follow up QTc which was mildly  elevated-  483.  Systolic CHF- Last [id number]- EF- 40-45%, with diffuse hypokinesis, with mitral regurg. Home meds- Frusemide- 20mg  daily. Followed with Dr. Aundra Dubin- last visit note- 2013. 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    Discharge Vitals:   BP 155/113  Pulse 89  Temp(Src) 97.9 F (36.6 C) (Oral)  Resp 12  Ht 6' (1.829 m)  Wt 157 lb 6.5 oz (71.4 kg)  BMI 21.34 kg/m2  SpO2 97%  Discharge Labs:  No results found for this or any previous visit (from the past 24 hour(s)).  Signed: Jenetta Downer, MD 09/14/2013, 5:02 PM    Services Ordered on Discharge: None Equipment Ordered on Discharge: None

## 2013-09-12 NOTE — Plan of Care (Signed)
Problem: Discharge Progression Outcomes Goal: Hemodynamically stable Outcome: Adequate for Discharge pts BP 150/105 on discharge.

## 2013-09-12 NOTE — H&P (Signed)
INTERNAL MEDICINE TEACHING ATTENDING NOTE  Day 1 of stay  Patient name: [name]  MRN: [id number] Date of birth: 1970/10/16   43 y.o. african american polysubstance user male admitted with hypertensive crisis and headache. He says he feels better with headache but it comes off and on. He otherwise feels well.   Filed Vitals:   09/12/13 0400 09/12/13 0425 09/12/13 0700 09/12/13 0800  BP: 147/116 162/115 168/107 152/102  Pulse: 81  83   Temp: 97.9 F (36.6 C)  98 F (36.7 C)   TempSrc: Oral  Oral   Resp: 17  17   Height:      Weight: 157 lb 6.5 oz (71.4 kg)     SpO2: 98%  95%     Physical Exam   General: No acute distress.  HEENT: PERRL, EOMI, no scleral icterus, injected conjunctivae bilaterally, no visual disturbance, no discharge from eyes, no neck tenderness/stiffness, oropharynx no erythema, tonsils not enlarged, no scalp tenderness over temporal region, no jaw pain, no JVD. Heart: RRR, no rubs, murmurs or gallops. Lungs: Clear to auscultation bilaterally, no wheezes, rales, or rhonchi. Abdomen: Soft, nontender, nondistended, BS present. Extremities: Warm, no pedal edema, peripheral pulses present. Skin - no rash on palms and soles Neuro: Alert and oriented X3, cranial nerves II-XII grossly intact,  strength and sensation to light touch equal in bilateral upper and lower extremities   Labs reviewed.    Inpatient Medications   Scheduled Medications . amLODipine  10 mg Oral Daily  . carvedilol  25 mg Oral BID  . fluticasone  2 spray Each Nare Daily  . heparin  5,000 Units Subcutaneous 3 times per day  . hydrALAZINE  25 mg Oral TID  . levofloxacin  500 mg Oral Daily  . lisinopril  20 mg Oral Daily  . sodium chloride  3 mL Intravenous Q12H    PRN Medications cyclobenzaprine, fentaNYL, furosemide, hydrALAZINE, sodium chloride   Assessment and Plan   I have reviewed Dr Dois Davenport note and I agree with her findings, assessment and plan, with these  additions:   Uncontrolled blood pressure, likely secondary to non-compliance, is now better controlled and the patient started on his home medications. Would observe today and reassess for discharge towards the end of the day. He has been ruled out of MI with negative troponins and no significant change on EKG.  Neck pain, and "worst" headache per patient report prompted a CT brain which has been negative for acute abnormalities regarding stroke, hemorrhage or other focal lesions however are positive for maxillary sinus opacification, possible cyst and ethmoid chronic sinusitis. The patient has been started on levofloxacin for that, however given QTc of 483 on most recent EKG, it is advisable to notify the PCP of the patient to follow EKG. He will need outpatient follow up for his sinusitis and an ENT referral for revaluation for a possible mass/cyst.   CKD - Creatinine trending down.   There does not seem to be any evidence for an infective focus at this time.   The patient dose not appear to be in CHF exacerbation.  Polysubstance abuse - the patient denied use to me, does not want help at this time, refused counseling.   Rest of the issues per resident note.  If the patient continues to do well, possible discharge today with early follow ups.  I have seen and evaluated this patient and discussed it with my IM resident team.  Please see the rest of the plan  per resident note from today.   [place], [place] 09/12/2013, 9:56 AM.

## 2013-09-12 NOTE — Progress Notes (Signed)
Subjective: Com,plaints of headache, relieved by morphine but not fentanyl or migraine cocktail. No dizziness or vision changes. Will like to go home today.  Objective: Vital signs in last 24 hours: Filed Vitals:   09/12/13 1153 09/12/13 1158 09/12/13 1200 09/12/13 1500  BP: 141/99 139/96 139/105 155/113  Pulse: 71 86 69 89  Temp: 97.5 F (36.4 C) 97.8 F (36.6 C)  97.9 F (36.6 C)  TempSrc: Oral Oral  Oral  Resp: 15 12 15 12   Height:      Weight:      SpO2: 98% 100% 99% 97%   Weight change:   Intake/Output Summary (Last 24 hours) at 09/12/13 1620 Last data filed at 09/12/13 1546  Gross per 24 hour  Intake   2160 ml  Output    450 ml  Net   1710 ml   General appearance: alert, cooperative and no distress Lungs: clear to auscultation bilaterally Heart: regular rate and rhythm, S1, S2 normal, no murmur, click, rub or gallop Abdomen: soft, non-tender; bowel sounds normal; no masses,  no organomegaly Extremities: extremities normal, atraumatic, no cyanosis or edema Pulses: 2+ and symmetric  Lab Results: Basic Metabolic Panel:  Recent Labs Lab 09/11/13 1020 09/11/13 1442 09/12/13 0410  NA 139  --  135*  K 4.0  --  3.9  CL 100  --  100  CO2 21  --  18*  GLUCOSE 87  --  86  BUN 12  --  14  CREATININE 1.61* 1.34 1.53*  CALCIUM 9.3  --  8.9   Liver Function Tests:  Recent Labs Lab 09/11/13 1020  AST 14  ALT 9  ALKPHOS 103  BILITOT 0.6  PROT 7.4  ALBUMIN 3.8    Recent Labs Lab 09/11/13 1020  LIPASE 17   CBC:  Recent Labs Lab 09/11/13 1020 09/11/13 1442 09/12/13 0410  WBC 5.9 6.2 5.8  NEUTROABS 2.6  --   --   HGB 13.7 13.5 13.5  HCT 40.0 40.8 39.5  MCV 91.7 93.4 92.7  PLT 157 150 163   Cardiac Enzymes:  Recent Labs Lab 09/11/13 1020 09/11/13 1525 09/11/13 2048  TROPONINI <0.30 <0.30 <0.30   BNP:  Recent Labs Lab 09/11/13 1020  PROBNP 1981.0*   Urine Drug Screen: Drugs of Abuse     Component Value Date/Time   LABOPIA  POSITIVE* 09/11/2013 1557   LABOPIA NEG 01/16/2009 2229   COCAINSCRNUR NONE DETECTED 09/11/2013 1557   COCAINSCRNUR NEG 01/16/2009 2229   LABBENZ NONE DETECTED 09/11/2013 1557   LABBENZ NEG 01/16/2009 2229   AMPHETMU NONE DETECTED 09/11/2013 1557   AMPHETMU NEG 01/16/2009 2229   THCU POSITIVE* 09/11/2013 1557   LABBARB NONE DETECTED 09/11/2013 1557    Alcohol Level:  Recent Labs Lab 09/11/13 1020  ETH <11   Urinalysis:  Recent Labs Lab 09/12/13 0953  COLORURINE YELLOW  LABSPEC 1.015  PHURINE 5.5  GLUCOSEU NEGATIVE  HGBUR NEGATIVE  BILIRUBINUR NEGATIVE  KETONESUR NEGATIVE  PROTEINUR 30*  UROBILINOGEN 0.2  NITRITE NEGATIVE  LEUKOCYTESUR NEGATIVE     Micro Results: Recent Results (from the past 240 hour(s))  MRSA PCR SCREENING     Status: None   Collection Time    09/11/13  1:10 PM      Result Value Ref Range Status   MRSA by PCR NEGATIVE  NEGATIVE Final   Comment:            The GeneXpert MRSA Assay (FDA     approved for NASAL  specimens     only), is one component of a     comprehensive MRSA colonization     surveillance program. It is not     intended to diagnose MRSA     infection nor to guide or     monitor treatment for     MRSA infections.   Studies/Results: Dg Chest 2 View  09/11/2013   CLINICAL DATA:  Chest pain and shortness of breath.  EXAM: CHEST  2 VIEW  COMPARISON:  PA and lateral chest and CT chest 09/19/2012. PA and lateral chest 08/08/2013.  FINDINGS: There is cardiomegaly without edema. Ectatic aorta is identified. Lungs are clear. No pneumothorax or pleural effusion.  IMPRESSION: Cardiomegaly without acute disease.  Stable compared to prior exam.   Electronically Signed   By: Inge Rise M.D.   On: 09/11/2013 11:02   Ct Head Wo Contrast  09/11/2013   CLINICAL DATA:  Worsened headache  EXAM: CT HEAD WITHOUT CONTRAST  TECHNIQUE: Contiguous axial images were obtained from the base of the skull through the vertex without intravenous contrast.   COMPARISON:  08/08/2013, 09/09/2010  FINDINGS: There is no evidence of mass effect, midline shift or extra-axial fluid collections. There is no evidence of a space-occupying lesion or intracranial hemorrhage. There is no evidence of a cortical-based area of acute infarction.  The ventricles and sulci are appropriate for the patient's age. The basal cisterns are patent.  Visualized portions of the orbits are unremarkable. Bilateral ethmoid sinus mucosal thickening. Complete opacification of the visualized right maxillary sinus with low attenuation extending into the right nasal passage which may represent a large mucous retention cyst which is similar to the prior exams.  The osseous structures are unremarkable.  IMPRESSION: 1.   No acute intracranial pathology.  2. Bilateral ethmoid sinus mucosal thickening. Complete opacification of the visualized right maxillary sinus with low attenuation extending into the right nasal passage which may represent a large mucous retention cyst which is similar to the prior exams.   Electronically Signed   By: Kathreen Devoid   On: 09/11/2013 16:22   Medications: I have reviewed the patient's current medications. Scheduled Meds: . amLODipine  10 mg Oral Daily  . carvedilol  25 mg Oral BID  . fluticasone  2 spray Each Nare Daily  . heparin  5,000 Units Subcutaneous 3 times per day  . hydrALAZINE  25 mg Oral TID  . levofloxacin  500 mg Oral Daily  . lisinopril  20 mg Oral Daily  . sodium chloride  3 mL Intravenous Q12H   Continuous Infusions:  PRN Meds:.cyclobenzaprine, fentaNYL, furosemide, hydrALAZINE, morphine injection, sodium chloride Assessment/Plan:  Hypertensive urgency- markedly elevated blood pressure from medication non-complaince. Blood pressure better controlled today on home meds. Complaints of headache, Head Ct Normal.  - Continue home meds- Norvasc- 10mg  daily, Coreg- 25mg  daily, Lisinopril- 20mg  daily, hydralazine tablet- 25mg  TID.  - IV morphine X1  for headache, discharge home on Tramadol 100mg  Q12H, #30 tablets. - Discharge today to home.  Chest pain- Chronic. Unchanged from prior. Relieved by NTG. TIMI- 0, 5% risk at 14 days. Trops x3 negative. Blood pressure in both arms, similar. UDS- psoitive for opiates (Given on admission) and THCU. -Follow up as outpt.  Sinusitis- Failed treatment with Augmentin, despite compliance. Ct scan- 08/08/2013 and repeat on this admission. Ct scan with retention cysts. - ENT via phone consult- recs- Levaquin- 500mg  daily for 7 days, with prednisone dose pack, 60mg  start, 50mg  on day 2, 40mg   on day 3, 30mg  on day 4, 20mg  on day 5, 10mg  on day 6. Total of 6 days. - QTc mildly prolonged- 483, on levaquin repeat EKG on follow up.  Abdominal pain- Chronic, without fever or leukocytosis. Pt also reported seeing some blood, has a hx of hemorrhoids, which he says resolved. Concern for diverticulosis, no colonoscopy on file. Hgb stable- 13-14. Had abd xray- 08/09/2013- Paucity of bowel gas in RLQ, scattered large and small bowel gas noted. Last abdominal Ct- 01/2012- Unremarkable except for Cardiomegaly and interstitial edema. LFTs, Lipase, Lactic acid, and UA on this admission normal.  - Lipase done in the Ed- normal  - Lactic acid  - FOBT  - UA   Systolic CHF- Last [id number]- EF- 40-45%, with diffuse hypokinesis, with mitral regurg. Home meds- Frusemide- 20mg  daily. Followed with Dr. Aundra Dubin- last visit note- 2013. 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   Increased Anion gap- 18, with normal bicarb- 21. Likely from chronic renal disease/uremia- CKD- 3-4. Cr- 1.52. Alcohol level- WNL.   Dispo: Disposition is deferred at this time, awaiting improvement of current medical problems.  Anticipated discharge in approximately 1-2 day(s).   The patient does not have a current PCP [name], MD) and does need an [facility] hospital follow-up appointment after discharge.  The patient does  have transportation limitations that hinder transportation to clinic appointments.  .Services Needed at time of discharge: Y = Yes, Blank = No PT:   OT:   RN:   Equipment:   Other:     LOS: 1 day   Jenetta Downer, MD 09/12/2013, 4:20 PM

## 2013-09-12 NOTE — Progress Notes (Signed)
Nutrition Brief Note  Patient identified on the Malnutrition Screening Tool (MST) Report for recent weight lost without trying and eating poorly because of a decreased appetite.  Per wt readings below, patient's wt has been stable since December 2014.  Wt Readings from Last 15 Encounters:  09/12/13 157 lb 6.5 oz (71.4 kg)  08/09/13 156 lb 15.5 oz (71.2 kg)  05/06/13 159 lb 13.3 oz (72.5 kg)  02/21/13 156 lb 8.4 oz (71 kg)  01/27/12 162 lb 7.7 oz (73.7 kg)  08/21/11 161 lb (73.029 kg)  07/31/11 164 lb (74.39 kg)  07/17/11 162 lb 6.4 oz (73.664 kg)  06/17/11 157 lb (71.215 kg)  06/09/11 166 lb (75.297 kg)  05/25/11 149 lb 11.1 oz (67.9 kg)  08/26/09 179 lb (81.194 kg)  02/18/09 190 lb (86.183 kg)  01/16/09 191 lb (86.637 kg)    Body mass index is 21.34 kg/(m^2). Patient meets criteria for Normal based on current BMI.   Current diet order is Carbohydrate Modified, patient is consuming approximately 75-100% of meals at this time. Labs and medications reviewed.   No nutrition interventions warranted at this time. If nutrition issues arise, please consult RD.   Arthur Holms, RD, LDN Pager #: [phone] After-Hours Pager #: [phone]

## 2013-09-12 NOTE — Clinical Documentation Improvement (Signed)
Possible Clinical Conditions?   Chronic Systolic Congestive Heart Failure Chronic Systolic & Diastolic Congestive Heart Failure Acute Systolic Congestive Heart Failure Acute Systolic & Diastolic Congestive Heart Failure Acute on Chronic Systolic Congestive Heart Failure Acute on Chronic Systolic & Diastolic Congestive Heart Failure Other Condition Cannot Clinically Determine    Risk Factors: CHF, systolic, last echo [id number] EF 4-45% per 6/29 progress notes.  Diagnostics: 6/29: proBNP:  1981.0 Treatment: Lasix 20 mg po daily prn  Thank You, Theron Arista, Clinical Documentation Specialist:  [phone]  [facility] [employer]

## 2013-09-12 NOTE — Progress Notes (Signed)
Pt complaining of headache 7/10 not relieved by fentanyl, requesting morphine. MD made aware. Mikaela M. Dalbert Batman, RN, BSN 09/12/2013 8:28 AM

## 2013-09-12 NOTE — Progress Notes (Signed)
Pt discharged per MD order, discharge instructions given to pt with verbalized understanding.   Mikaela M. Dalbert Batman, RN, BSN 09/12/2013 6:18 PM

## 2013-09-14 NOTE — Progress Notes (Signed)
Utilization review completed. Ansel Ferrall, RN, BSN. 

## 2013-09-19 NOTE — Discharge Summary (Signed)
INTERNAL MEDICINE ATTENDING DISCHARGE COSIGN   I discussed the discharge plan with my resident team. I agree with the discharge documentation and disposition.   [name] 09/19/2013, 8:35 AM

## 2013-09-20 ENCOUNTER — Ambulatory Visit: Payer: Medicare Other | Admitting: Internal Medicine

## 2013-09-20 ENCOUNTER — Encounter: Payer: Self-pay | Admitting: Physician Assistant

## 2013-09-20 ENCOUNTER — Ambulatory Visit (INDEPENDENT_AMBULATORY_CARE_PROVIDER_SITE_OTHER): Payer: Medicare Other | Admitting: Physician Assistant

## 2013-09-20 VITALS — BP 160/112 | HR 89 | Ht 72.0 in | Wt 159.0 lb

## 2013-09-20 DIAGNOSIS — F17200 Nicotine dependence, unspecified, uncomplicated: Secondary | ICD-10-CM

## 2013-09-20 DIAGNOSIS — Z720 Tobacco use: Secondary | ICD-10-CM

## 2013-09-20 DIAGNOSIS — I119 Hypertensive heart disease without heart failure: Secondary | ICD-10-CM

## 2013-09-20 DIAGNOSIS — I10 Essential (primary) hypertension: Secondary | ICD-10-CM

## 2013-09-20 DIAGNOSIS — I43 Cardiomyopathy in diseases classified elsewhere: Secondary | ICD-10-CM

## 2013-09-20 DIAGNOSIS — F1010 Alcohol abuse, uncomplicated: Secondary | ICD-10-CM

## 2013-09-20 MED ORDER — CLONIDINE HCL 0.1 MG PO TABS: 0.1000 mg | ORAL_TABLET | Freq: Two times a day (BID) | ORAL | Status: DC

## 2013-09-20 NOTE — Progress Notes (Signed)
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 2-D echo in 02/2013 EF was 45-50%. The patient ran out of his blood pressure medication 2 days prior to ER visit. He was treated with Cardene drip. CTA of the head was consistent with sinusitis. Patient also had chest pain unrelated to activity.  Patient comes in today with elevated blood pressure. He says he is taking all his medications. He does continue to smoke 7 cigarettes daily and went out to bars last night drinking [place] iced teas. He also eats some canned foods. He complains about his blood pressure medications affecting his sex drive.  Allergies -- Imdur [Isosorbide] -- Other (See Comments)   --  headache  Current Outpatient Prescriptions on File Prior to Visit: amLODipine (NORVASC) 10 MG tablet, Take 10 mg by mouth daily., Disp: , Rfl:   amLODipine (NORVASC) 10 MG tablet, Take 1 tablet (10 mg total) by mouth daily., Disp: 30 tablet, Rfl: 1 carvedilol (COREG) 25 MG tablet, Take 1 tablet (25 mg total) by mouth 2 (two) times daily., Disp: 60 tablet, Rfl: 0 carvedilol (COREG) 25 MG tablet, Take 1 tablet (25 mg total) by mouth 2 (two) times daily., Disp: 30 tablet, Rfl: 1 cyclobenzaprine (FLEXERIL) 5 MG tablet, Take 1 tablet (5 mg total) by mouth 3 (three) times daily as needed for muscle spasms (please give 1st dose now)., Disp: 30 tablet, Rfl: 0 furosemide (LASIX) 20 MG tablet, Take 1 tablet (20 mg total) by mouth daily as needed for fluid or edema (shortness of breath)., Disp: 30 tablet, Rfl: 0 furosemide (LASIX) 20 MG tablet, Take 1 tablet (20 mg total) by mouth daily as needed for fluid or edema (shortness of breath)., Disp: 30 tablet, Rfl: 1 hydrALAZINE (APRESOLINE) 25 MG tablet, Take 25 mg by mouth 3 (three) times  daily., Disp: , Rfl:  hydrALAZINE (APRESOLINE) 25 MG tablet, Take 1 tablet (25 mg total) by mouth 3 (three) times daily., Disp: 30 tablet, Rfl: 1 levofloxacin (LEVAQUIN) 500 MG tablet, Take 1 tablet (500 mg total) by mouth daily., Disp: 7 tablet, Rfl: 0 lisinopril (PRINIVIL,ZESTRIL) 20 MG tablet, Take 20 mg by mouth daily., Disp: , Rfl:  lisinopril (PRINIVIL,ZESTRIL) 20 MG tablet, Take 1 tablet (20 mg total) by mouth daily., Disp: 30 tablet, Rfl: 1 predniSONE (STERAPRED UNI-PAK) 10 MG tablet, Take by mouth daily. For 6 days.1st day- 60mg . 2nd day- 50mg , 3rd- 40mg , 4th- 30mg , 5th-20mg , 6th-10mg , Disp: 21 tablet, Rfl: 0 traMADol (ULTRAM) 50 MG tablet, Take 2 tablets (100 mg total) by mouth every 12 (twelve) hours as needed., Disp: 30 tablet, Rfl: 0  No current facility-administered medications on file prior to visit.   Past Medical History:   Hypertension                                                   Comment:a. Hx of HTN urgency secondary to               noncompliance. b. urinary metanephrine and               catecholeamine levels normal 2013.   Anxiety  GERD (gastroesophageal reflux disease)                       Acute renal failure                                            Comment:June 2012 felt secondary to Toradol   Chronic renal failure                                        CHF (congestive heart failure)                                 Comment:a. 05/2011: Adm with pulm edema/HTN urgency, EF               35-40% with diffuse hypokinesis and moderate to              severe mitral regurgitation. Cardiomyopathy               likely due to uncontrolled HTN and ETOH abuse -              cath deferred due to renal insufficiency (felt               due to uncontrolled HTN). bJodie Echevaria MV 06/2011:              EF 37% and no ischemia or infarction. c. EF               45-50% by echo 01/2012.   Cardiomyopathy                                                HYPERLIPIDEMIA                                               INGUINAL HERNIA                                              ETOH abuse                                                     Comment:a. Reported to have quit 05/2011.   Valvular heart disease                                         Comment:a. Echo 05/2011: moderate to severe eccentric MR              and mild to moderate AI with prolapsing left  coronary cusp. b. Echo 01/2012: mild-mod AI,               mild dilitation of aortic root, mild MR.   CKD (chronic kidney disease)                                   Comment:a. 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   Tobacco abuse                                                Pneumonia                                       ~ 2013       Headache(784.0)                                                Comment:"q other day" (08/08/2013)   Migraine                                                       Comment:"twice/month" (08/08/2013)   Chronic sinusitis                                            History of medication noncompliance                          Chronic abdominal pain                                       DDD (degenerative disc disease), lumbar                     Past Surgical History:   ANKLE SURGERY                                                   Comment:Fractures bilaterally   INGUINAL HERNIA REPAIR                          Right ~ [facility]                           Bilateral 2004-2010      Comment:"got pins in both of them"  Review of patient's family history indicates:   Hypertension  Colon cancer                   Paternal Uncle           Social History   Marital Status: Married             Spouse Name:                      Years of Education:                 Number of children: 5           Occupational History Occupation          Tax inspector              Disabled,  part-tim*                     Disability  Social History Main Topics   Smoking Status: Current Some Day Smoker         Packs/Day: 0.25  Years: 23        Types: Cigarettes   Smokeless Status: Former Systems developer                        Types: Chew   Alcohol Use: Yes           6.6 oz/week      11 Shots of liquor per week      Comment: 08/08/2013 "used to drink 1 pint of gin a                week, now drink ~ 1 pint gin/month"   Drug Use: Yes               Special: Marijuana      Comment: 08/08/2013 "twice a month"   Sexual Activity: Yes                Other Topics            Concern   None on file  Social History Narrative   None on file    ROS: See history of present illness otherwise negative   PHYSICAL EXAM: Well-nournished, in no acute distress. Neck: No JVD, HJR, Bruit, or thyroid enlargement  Lungs: No tachypnea, clear without wheezing, rales, or rhonchi  Cardiovascular: RRR, positive [id number] to 3/6 systolic murmur at the left sternal border and apex and 2/6 diastolic murmur at the apex, no bruit, thrill, or heave.  Abdomen: BS normal. Soft without organomegaly, masses, lesions or tenderness.  Extremities: without cyanosis, clubbing or edema. Good distal pulses bilateral  SKin: Warm, no lesions or rashes   Musculoskeletal: No deformities  Neuro: no focal signs  BP 160/112  Pulse 89  Ht 6' (1.829 m)  Wt 159 lb (72.122 kg)  BMI 21.56 kg/m2

## 2013-09-20 NOTE — Progress Notes (Signed)
Patient did not show up to his hospitalization follow-up appointment today. It appears the PCP listed for him in the system, Dr. [name], appears to have completed his training at [facility] and is now a pulmonologist in [place]. Let's hope he makes it to his next visit. Thank you.

## 2013-09-20 NOTE — Patient Instructions (Signed)
Your physician recommends that you schedule a follow-up appointment in: 2-3 [place]  Your physician recommends that you schedule a follow-up appointment in: IN 2 WEEKS NURSE VISIT TO GET BLOOD PRESSURE CHECK  Your physician has recommended you make the following change in your medication:   START CLONIDINE 0.1 MG TWICE A DAY  YOUR PROVIDER WOULD LIKE FOR YOU TO STOP SMOKING AND DRINKING  YOUR PROVIDER WOULD LIKE FOR YOU TO GO ON A 2 GRAM SODIUM DIET  Alcohol and Nutrition Nutrition serves two purposes. It provides energy. It also maintains body structure and function. Food supplies energy. It also provides the building blocks needed to replace worn or damaged cells. Alcoholics often eat poorly. This limits their supply of essential nutrients. This affects energy supply and structure maintenance. Alcohol also affects the body's nutrients in:  Digestion.  Storage.  Using and getting rid of waste products. IMPAIRMENT OF NUTRIENT DIGESTION AND UTILIZATION   Once ingested, food must be broken down into small components (digested). Then it is available for energy. It helps maintain body structure and function. Digestion begins in the mouth. It continues in the stomach and intestines, with help from the pancreas. The nutrients from digested food are absorbed from the intestines into the blood. Then they are carried to the liver. The liver prepares nutrients for:  Immediate use.  Storage and future use.  Alcohol inhibits the breakdown of nutrients into usable molecules.  It decreases secretion of digestive enzymes from the pancreas.  Alcohol impairs nutrient absorption by damaging the cells lining the stomach and intestines.  It also interferes with moving some nutrients into the blood.  In addition, nutritional deficiencies themselves may lead to further absorption problems.  For example, folate deficiency changes the cells that line the small intestine. This impairs how  water is absorbed. It also affects absorbed nutrients. These include glucose, sodium, and additional folate.  Even if nutrients are digested and absorbed, alcohol can prevent them from being fully used. It changes their transport, storage, and excretion. Impaired utilization of nutrients by alcoholics is indicated by:  Decreased liver stores of vitamins, such as vitamin A.  Increased excretion of nutrients such as fat. ALCOHOL AND ENERGY SUPPLY   Three basic nutritional components found in food are:  Carbohydrates.  Proteins.  Fats.  These are used as energy. Some alcoholics take in as much as 50% of their total daily calories from alcohol. They often neglect important foods.  Even when enough food is eaten, alcohol can impair the ways the body controls blood sugar (glucose) levels. It may either increase or decrease blood sugar.  In non-diabetic alcoholics, increased blood sugar (hyperglycemia) is caused by poor insulin secretion. It is usually temporary.  Decreased blood sugar (hypoglycemia) can cause serious injury even if this condition is short-lived. Low blood sugar can happen when a fasting or malnourished person drinks alcohol. When there is no food to supply energy, stored sugar is used up. The products of alcohol inhibit forming glucose from other compounds such as amino acids. As a result, alcohol causes the brain and other body tissue to lack glucose. It is needed for energy and function.  Alcohol is an energy source. But how the body processes and uses the energy from alcohol is complex. Also, when alcohol is substituted for carbohydrates, subjects tend to lose weight. This indicates that they get less energy from alcohol than from food. ALCOHOL - MAINTAINING CELL STRUCTURE AND FUNCTION  Structure Cells are made mostly  of protein. So an adequate protein diet is important for maintaining cell structure. This is especially true if cells are being damaged. Research indicates  that alcohol affects protein nutrition by causing impaired:  Digestion of proteins to amino acids.  Processing of amino acids by the small intestine and liver.  Synthesis of proteins from amino acids.  Protein secretion by the liver. Function Nutrients are essential for the body to function well. They provide the tools that the body needs to work well:   Proteins.  Vitamins.  Minerals. Alcohol can disrupt body function. It may cause nutrient deficiencies. And it may interfere with the way nutrients are processed. Vitamins  Vitamins are essential to maintain growth and normal metabolism. They regulate many of the body`s processes. Chronic heavy drinking causes deficiencies in many vitamins. This is caused by eating less. And, in some cases, vitamins may be poorly absorbed. For example, alcohol inhibits fat absorption. It impairs how the vitamins A, E, and D are normally absorbed along with dietary fats. Not enough vitamin A may cause night blindness. Not enough vitamin D may cause softening of the bones.  Some alcoholics lack vitamins A, C, D, E, K, and the B vitamins. These are all involved in wound healing and cell maintenance. In particular, because vitamin K is necessary for blood clotting, lacking that vitamin can cause delayed clotting. The result is excess bleeding. Lacking other vitamins involved in brain function may cause severe neurological damage. Minerals Deficiencies of minerals such as calcium, magnesium, iron, and zinc are common in alcoholics. The alcohol itself does not seem to affect how these minerals are absorbed. Rather, they seem to occur secondary to other alcohol-related problems, such as:  Less calcium absorbed.  Not enough magnesium.  More urinary excretion.  Vomiting.  Diarrhea.  Not enough iron due to gastrointestinal bleeding.  Not enough zinc or losses related to other nutrient deficiencies.  Mineral deficiencies can cause a variety of medical  consequences. These range from calcium-related bone disease to zinc-related night blindness and skin lesions. ALCOHOL, MALNUTRITION, AND MEDICAL COMPLICATIONS  Liver Disease   Alcoholic liver damage is caused primarily by alcohol itself. But poor nutrition may increase the risk of alcohol-related liver damage. For example, nutrients normally found in the liver are known to be affected by drinking alcohol. These include carotenoids, which are the major sources of vitamin A, and vitamin E compounds. Decreases in such nutrients may play some role in alcohol-related liver damage. Pancreatitis  Research suggests that malnutrition may increase the risk of developing alcoholic pancreatitis. Research suggests that a diet lacking in protein may increase alcohol's damaging effect on the pancreas. Brain  Nutritional deficiencies may have severe effects on brain function. These may be permanent. Specifically, thiamine deficiencies are often seen in alcoholics. They can cause severe neurological problems. These include:  Impaired movement.  Memory loss seen in Wernicke-Korsakoff syndrome. Pregnancy  Alcohol has toxic effects on fetal development. It causes alcohol-related birth defects. They include fetal alcohol syndrome. Alcohol itself is toxic to the fetus. Also, the nutritional deficiency can affect how the fetus develops. That may compound the risk of developmental damage.  Nutritional needs during pregnancy are 10% to 30% greater than normal. Food intake can increase by as much as 140% to cover the needs of both mother and fetus. An alcoholic mother`s nutritional problems may adversely affect the nutrition of the fetus. And alcohol itself can also restrict nutrition flow to the fetus. NUTRITIONAL STATUS OF ALCOHOLICS  Techniques for assessing  nutritional status include:  Taking body measurements to estimate fat reserves. They include:  Weight.  Height.  Mass.  Skin fold  thickness.  Performing blood analysis to provide measurements of circulating:  Proteins.  Vitamins.  Minerals.  These techniques tend to be imprecise. For many nutrients, there is no clear "cut-off" point that would allow an accurate definition of deficiency. So assessing the nutritional status of alcoholics is limited by these techniques. Dietary status may provide information about the risk of developing nutritional problems. Dietary status is assessed by:  Taking patients' dietary histories.  Evaluating the amount and types of food they are eating.  It is difficult to determine what exact amount of alcohol begins to have damaging effects on nutrition. In general, moderate drinkers have 2 drinks or less per day. They seem to be at little risk for nutritional problems. Various medical disorders begin to appear at greater levels.  Research indicates that the majority of even the heaviest drinkers have few obvious nutritional deficiencies. Many alcoholics who are hospitalized for medical complications of their disease do have severe malnutrition. Alcoholics tend to eat poorly. Often they eat less than the amounts of food necessary to provide enough:  Carbohydrates.  Protein.  Fat.  Vitamins A and C.  B vitamins.  Minerals like calcium and iron. Of major concern is alcohol's effect on digesting food and use of nutrients. It may shift a mildly malnourished person toward severe malnutrition. Document Released: 12/25/2004 Document Revised: 05/25/2011 Document Reviewed: 06/10/2005 [facility] Patient Information 2015 [place], [place]. This information is not intended to replace advice given to you by your health care provider. Make sure you discuss any questions you have with your health care provider.  Smoking Cessation Quitting smoking is important to your health and has many advantages. However, it is not always easy to quit since nicotine is a very addictive drug. Often times, people  try 3 times or more before being able to quit. This document explains the best ways for you to prepare to quit smoking. Quitting takes hard work and a lot of effort, but you can do it. ADVANTAGES OF QUITTING SMOKING  You will live longer, feel better, and live better.  Your body will feel the impact of quitting smoking almost immediately.  Within 20 minutes, blood pressure decreases. Your pulse returns to its normal level.  After 8 hours, carbon monoxide levels in the blood return to normal. Your oxygen level increases.  After 24 hours, the chance of having a heart attack starts to decrease. Your breath, hair, and body stop smelling like smoke.  After 48 hours, damaged nerve endings begin to recover. Your sense of taste and smell improve.  After 72 hours, the body is virtually free of nicotine. Your bronchial tubes relax and breathing becomes easier.  After 2 to 12 weeks, lungs can hold more air. Exercise becomes easier and circulation improves.  The risk of having a heart attack, stroke, cancer, or lung disease is greatly reduced.  After 1 year, the risk of coronary heart disease is cut in half.  After 5 years, the risk of stroke falls to the same as a nonsmoker.  After 10 years, the risk of lung cancer is cut in half and the risk of other cancers decreases significantly.  After 15 years, the risk of coronary heart disease drops, usually to the level of a nonsmoker.  If you are pregnant, quitting smoking will improve your chances of having a healthy baby.  The people you live  with, especially any children, will be healthier.  You will have extra money to spend on things other than cigarettes. QUESTIONS TO THINK ABOUT BEFORE ATTEMPTING TO QUIT You may want to talk about your answers with your caregiver.  Why do you want to quit?  If you tried to quit in the past, what helped and what did not?  What will be the most difficult situations for you after you quit? How will you  plan to handle them?  Who can help you through the tough times? Your family? Friends? A caregiver?  What pleasures do you get from smoking? What ways can you still get pleasure if you quit? Here are some questions to ask your caregiver:  How can you help me to be successful at quitting?  What medicine do you think would be best for me and how should I take it?  What should I do if I need more help?  What is smoking withdrawal like? How can I get information on withdrawal? GET READY  Set a quit date.  Change your environment by getting rid of all cigarettes, ashtrays, matches, and lighters in your home, car, or work. Do not let people smoke in your home.  Review your past attempts to quit. Think about what worked and what did not. GET SUPPORT AND ENCOURAGEMENT You have a better chance of being successful if you have help. You can get support in many ways.  Tell your family, friends, and co-workers that you are going to quit and need their support. Ask them not to smoke around you.  Get individual, group, or telephone counseling and support. Programs are available at [employer] and health centers. Call your local health department for information about programs in your area.  Spiritual beliefs and practices may help some smokers quit.  Download a "quit meter" on your computer to keep track of quit statistics, such as how long you have gone without smoking, cigarettes not smoked, and money saved.  Get a self-help book about quitting smoking and staying off of tobacco. [place] yourself from urges to smoke. Talk to someone, go for a walk, or occupy your time with a task.  Change your normal routine. Take a different route to work. Drink tea instead of coffee. Eat breakfast in a different place.  Reduce your stress. Take a hot bath, exercise, or read a book.  Plan something enjoyable to do every day. Reward yourself for not smoking.  Explore  interactive web-based programs that specialize in helping you quit. GET MEDICINE AND USE IT CORRECTLY Medicines can help you stop smoking and decrease the urge to smoke. Combining medicine with the above behavioral methods and support can greatly increase your chances of successfully quitting smoking.  Nicotine replacement therapy helps deliver nicotine to your body without the negative effects and risks of smoking. Nicotine replacement therapy includes nicotine gum, lozenges, inhalers, nasal sprays, and skin patches. Some may be available over-the-counter and others require a prescription.  Antidepressant medicine helps people abstain from smoking, but how this works is unknown. This medicine is available by prescription.  Nicotinic receptor partial agonist medicine simulates the effect of nicotine in your brain. This medicine is available by prescription. Ask your caregiver for advice about which medicines to use and how to use them based on your health history. Your caregiver will tell you what side effects to look out for if you choose to be on a medicine or therapy. Carefully read the  information on the package. Do not use any other product containing nicotine while using a nicotine replacement product.  RELAPSE OR DIFFICULT SITUATIONS Most relapses occur within the first 3 months after quitting. Do not be discouraged if you start smoking again. Remember, most people try several times before finally quitting. You may have symptoms of withdrawal because your body is used to nicotine. You may crave cigarettes, be irritable, feel very hungry, cough often, get headaches, or have difficulty concentrating. The withdrawal symptoms are only temporary. They are strongest when you first quit, but they will go away within 10-14 days. To reduce the chances of relapse, try to:  Avoid drinking alcohol. Drinking lowers your chances of successfully quitting.  Reduce the amount of caffeine you consume. Once you  quit smoking, the amount of caffeine in your body increases and can give you symptoms, such as a rapid heartbeat, sweating, and anxiety.  Avoid smokers because they can make you want to smoke.  Do not let weight gain distract you. Many smokers will gain weight when they quit, usually less than 10 pounds. Eat a healthy diet and stay active. You can always lose the weight gained after you quit.  Find ways to improve your mood other than smoking. FOR MORE INFORMATION  www.smokefree.gov  Document Released: 02/24/2001 Document Revised: 09/01/2011 Document Reviewed: 06/11/2011 [facility] Patient Information 2015 [place], [place]. This information is not intended to replace advice given to you by your health care provider. Make sure you discuss any questions you have with your health care provider.

## 2013-09-20 NOTE — Assessment & Plan Note (Signed)
Smoking cessation discussed 

## 2013-09-20 NOTE — Assessment & Plan Note (Signed)
No evidence of heart failure on exam 

## 2013-09-20 NOTE — Assessment & Plan Note (Signed)
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 He did stop drinking for long period of time but without bars last night. Recommend cessation of alcohol.

## 2013-09-20 NOTE — Assessment & Plan Note (Signed)
Patient's blood pressure remains significantly elevated. We'll add clonidine 0.1 mg twice a day. 2 g sodium diet. Recommend abstaining from alcohol and cigarettes. He has a followup with Dr. Posey Pronto tomorrow. We'll need a blood pressure check in 2 weeks either here or with Dr. Posey Pronto. Followup with Dr. Marigene Ehlers in 2-3 months.

## 2013-11-26 IMAGING — CR DG CHEST 2V
2 series · 2 of 2 positions shown · non-contrast
Comparison: Chest radiograph 09/08/2010 and 08/11/2009.  CT angio
chest 09/08/2010.

CLINICAL DATA: Cough congestion fever.  Shortness of breath.

CHEST - 2 VIEW

[w chest pa]
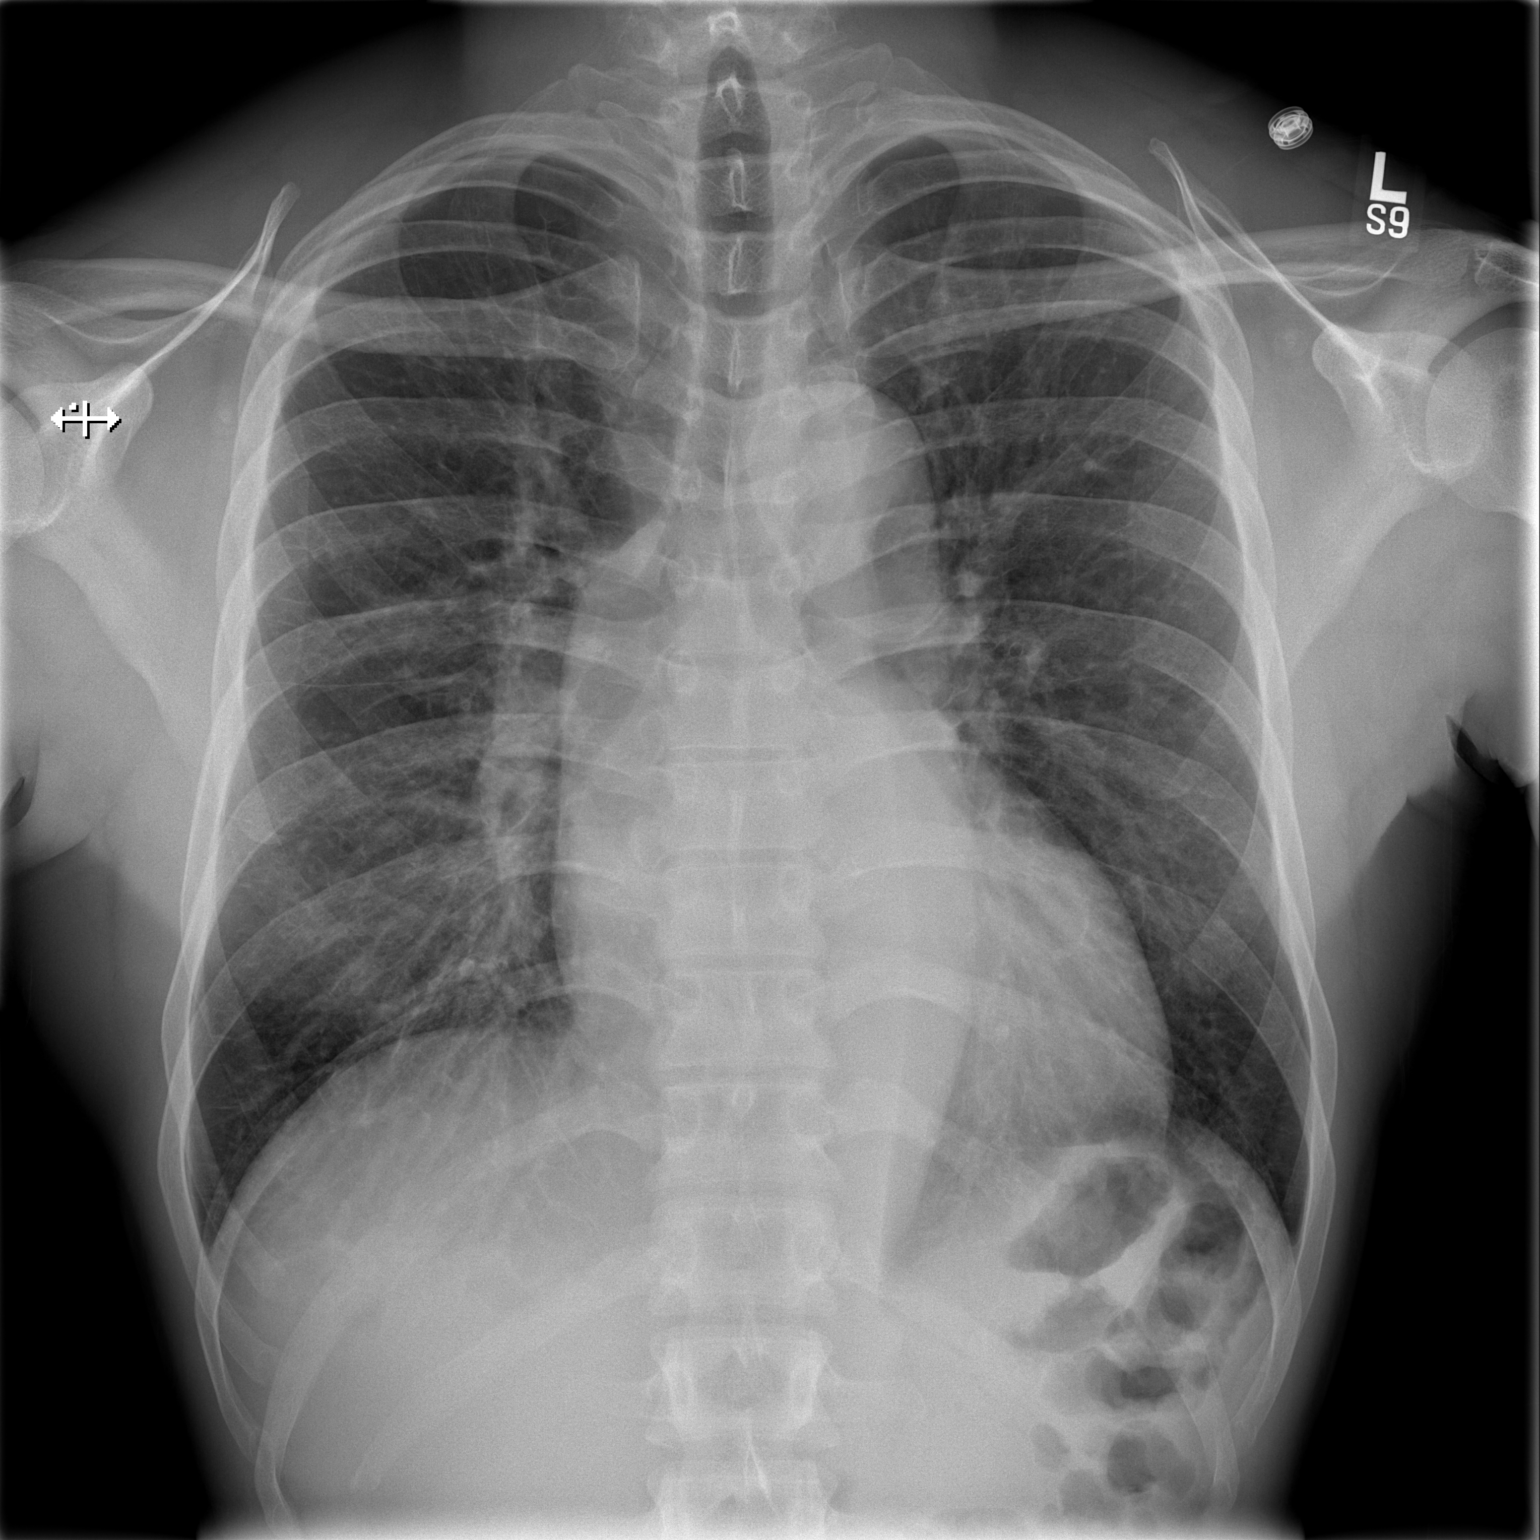

[w chest lat]
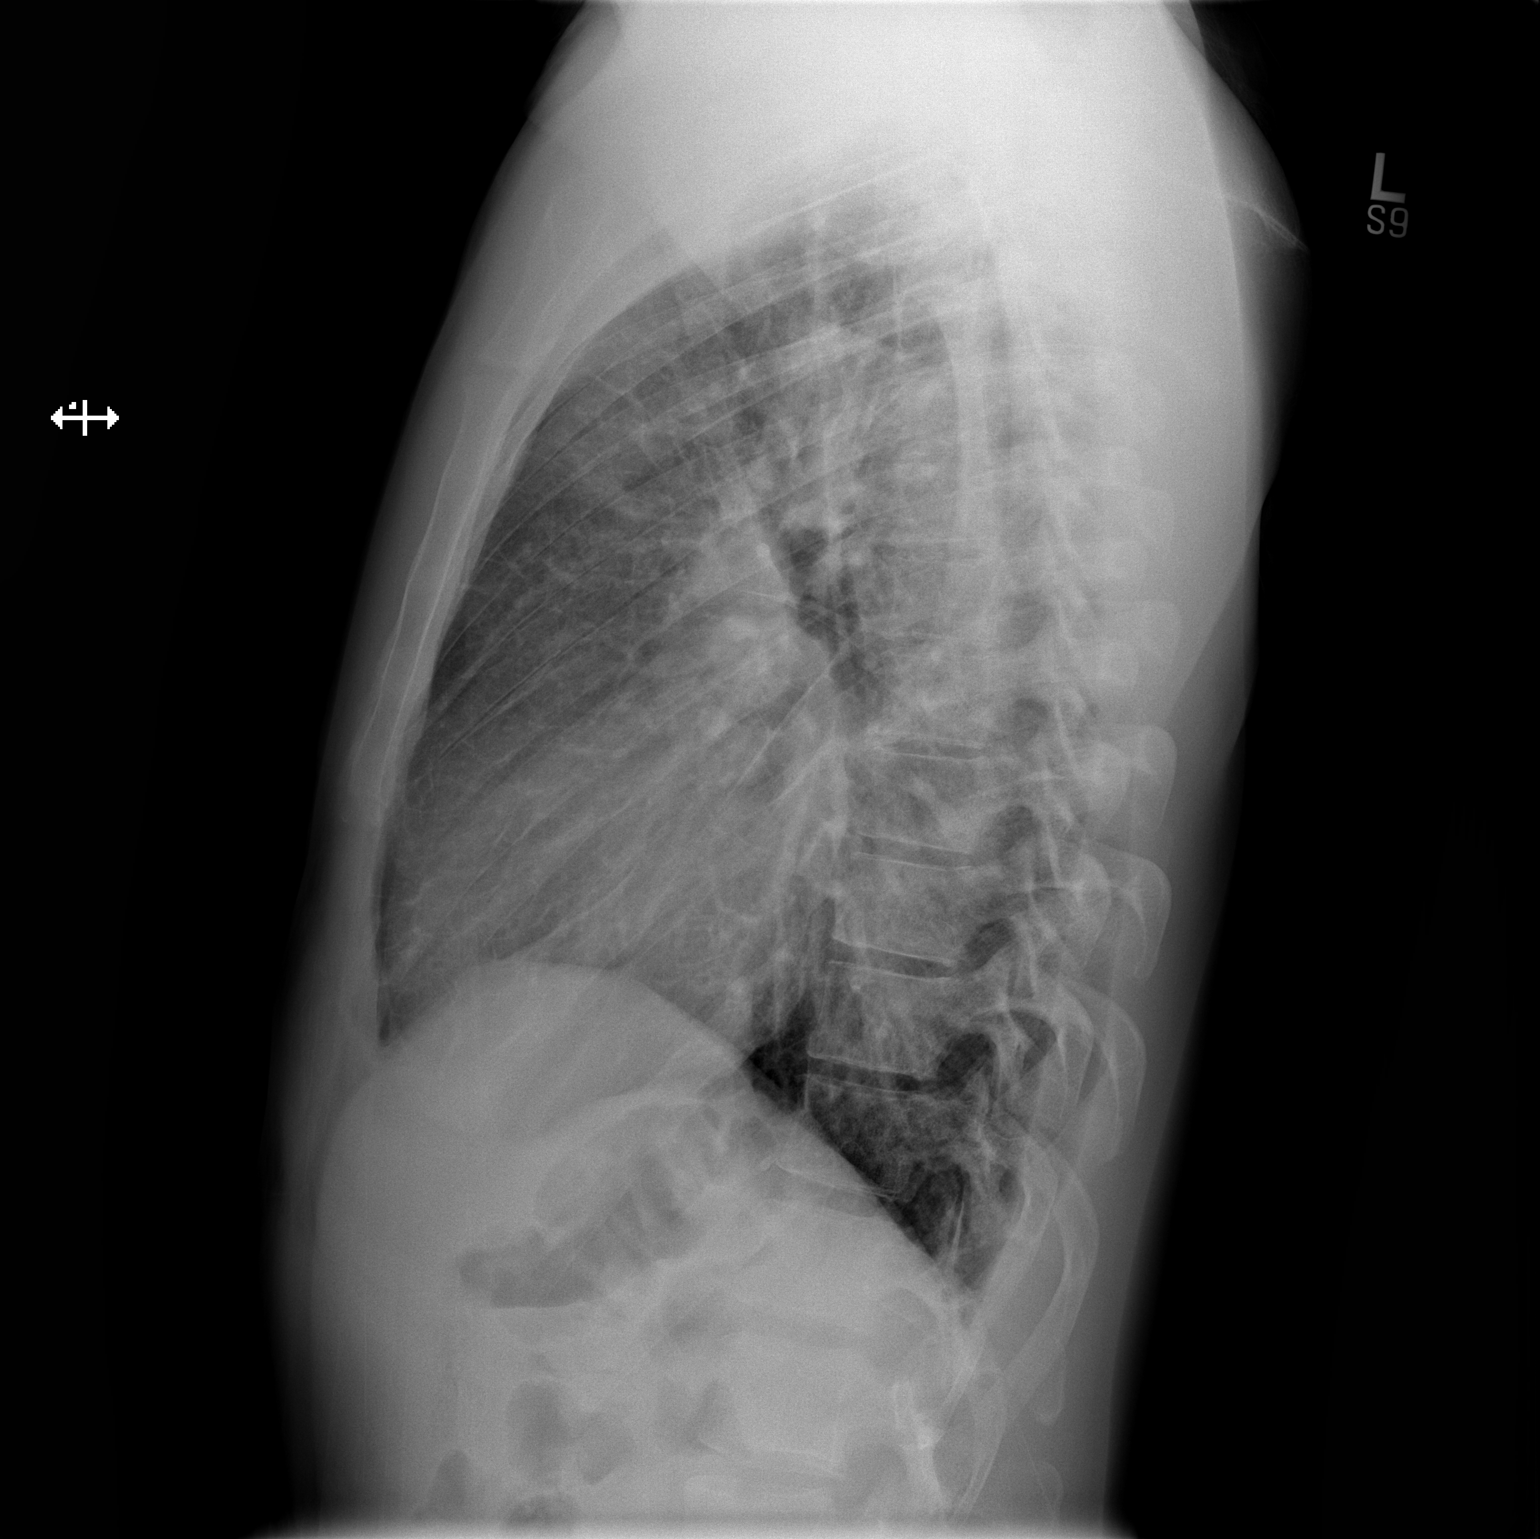

[2 of 2 positions shown; findings below may reference images not displayed]

FINDINGS: Stable cardiomegaly.  Stable mildly prominent ascending
aorta contour.  Mild pulmonary vascular congestion. Peripheral
interstitial markings are mildly prominent (Kerley B lines),
suggesting mild interstitial edema.  No focal airspace disease or
pleural effusion.  Trachea is midline.  The bony thorax is
unremarkable.
IMPRESSION: Stable cardiomegaly with pulmonary vascular congestion and early /
mild interstitial edema.

## 2013-12-04 ENCOUNTER — Encounter: Payer: Self-pay | Admitting: Cardiology

## 2013-12-04 ENCOUNTER — Ambulatory Visit (INDEPENDENT_AMBULATORY_CARE_PROVIDER_SITE_OTHER): Payer: Medicare Other | Admitting: Cardiology

## 2013-12-04 VITALS — BP 204/118 | HR 79 | Ht 72.0 in | Wt 157.8 lb

## 2013-12-04 DIAGNOSIS — I10 Essential (primary) hypertension: Secondary | ICD-10-CM | POA: Diagnosis not present

## 2013-12-04 DIAGNOSIS — I119 Hypertensive heart disease without heart failure: Secondary | ICD-10-CM

## 2013-12-04 DIAGNOSIS — I351 Nonrheumatic aortic (valve) insufficiency: Secondary | ICD-10-CM

## 2013-12-04 DIAGNOSIS — I359 Nonrheumatic aortic valve disorder, unspecified: Secondary | ICD-10-CM

## 2013-12-04 DIAGNOSIS — R0602 Shortness of breath: Secondary | ICD-10-CM

## 2013-12-04 DIAGNOSIS — N184 Chronic kidney disease, stage 4 (severe): Secondary | ICD-10-CM | POA: Diagnosis not present

## 2013-12-04 DIAGNOSIS — F17200 Nicotine dependence, unspecified, uncomplicated: Secondary | ICD-10-CM

## 2013-12-04 DIAGNOSIS — F1010 Alcohol abuse, uncomplicated: Secondary | ICD-10-CM

## 2013-12-04 MED ORDER — HYDRALAZINE HCL 50 MG PO TABS: ORAL_TABLET | ORAL | Status: DC

## 2013-12-04 MED ORDER — LISINOPRIL 20 MG PO TABS: 20.0000 mg | ORAL_TABLET | Freq: Every day | ORAL | Status: DC

## 2013-12-04 MED ORDER — AMLODIPINE BESYLATE 10 MG PO TABS: 10.0000 mg | ORAL_TABLET | Freq: Every day | ORAL | Status: DC

## 2013-12-04 MED ORDER — SPIRONOLACTONE 25 MG PO TABS: 25.0000 mg | ORAL_TABLET | Freq: Every day | ORAL | Status: DC

## 2013-12-04 MED ORDER — CARVEDILOL 25 MG PO TABS: 25.0000 mg | ORAL_TABLET | Freq: Two times a day (BID) | ORAL | Status: DC

## 2013-12-04 NOTE — Patient Instructions (Addendum)
Start spironolactone 25mg  daily.   Increase hydralazine to 75mg  three times a day. This will be 1 and 1/2 of a 50mg  tablet three times a day.  Your physician recommends that you return for lab work in: 20 days--BMET.  Your physician has requested that you regularly monitor and record your blood pressure readings at home. Please use the same machine at the same time of day to check your readings. I will call you in about 2 weeks to get the readings. [place]  Your physician recommends that you schedule a follow-up appointment in: 1 month with PA/NP.  Your physician recommends that you schedule a follow-up appointment in: 2 months with Dr Aundra Dubin.  Please schedule the appointment for Feb 01, 2014 at 9:45AM.  You have been referred to Dr Joelyn Oms, Nephrologist

## 2013-12-04 NOTE — Progress Notes (Signed)
Patient ID: [name], male   DOB: [birth year], 43 y.o.   MRN: [id number] 43 yo with history of uncontrolled HTN and ETOH abuse presents for cardiology followup.  In 3/13, he was admitted with severe dyspnea and was found to have BP > 200/100 with pulmonary edema.  He was diuresed and BP was controlled.  Echo showed EF 35-40% with diffuse hypokinesis and moderate to severe mitral regurgitation.  Cardiomyopathy was thought to be most likely due to HTN and ETOH abuse.  Cardiac cath was not done due to elevated creatinine, which was thought to be secondary to uncontrolled BP as well.  Instead, I had him do a Lexiscan myoview in 4/13, which showed EF 37% and no ischemia or infarction.  He was admitted again in 6/15 with hypertensive emergency (had run out of meds).  Last echo in 12/14 showed EF 45-50% with moderate LVH, mild MR, and moderate AI.  He followed up recently with Estella Husk who gave him a prescription for clonidine.  He did not start it.    He ran out of all his meds today but did take the last doses today.  His BP today is 204/118.  He feels ok.  He quit drinking about 3 months ago.  He still smokes about 6 cigarettes/day.  He has generalized fatigue but no chest pain.  No exertional dyspnea.  He has not seen nephrology in several years.  Last creatinine was 1.5.     Labs (3/13): K 4.2, creatinine 1.7, urinary metanephrine and catecholeamine levels normal, LDL 94, HDL 38, pro-BNP 7080 => 2737 Labs (6/15): K 3.9, creatinine 1.53  PMH: 1. H/o ankle fractures bilaterally 2. GERD 3. HTN: Negative urinary catecholeamine collection for pheochromocytoma.  4. CKD: Suspect hypertensive nephropathy.  5. Cardiomyopathy: Most likely due to heavy ETOH ingestion and uncontrolled HTN.   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  Lexiscan myoview (4/13): EF 37%, global hypokinesis,  no ischemic or infarction.  Echo (12/14) with EF 45-50%, diffuse hypokinesis, moderate LVH, mild MR, moderate AI.  6. ETOH abuse: Quit 3/13.  7. Active smoker. 8. Valvular heart disease: Echo in 3/13 showed moderate to severe eccentric MR and mild to moderate AI with prolapsing left coronary cusp. Echo 12/14 with mild MR and moderate AI.  9. Headache with Imdur.   SH: On disability.  Married, has 5 biological children and 5 adopted children.  H/o heavy ETOH use, about 2 pints liquor/day until he quit in 6/15.  Smokes but now down to 6 cigs/day.    ROS:  All systems reviewed and negative except as per HPI.   FH: No premature CAD. +HTN.   Current Outpatient Prescriptions  Medication Sig Dispense Refill  . amLODipine (NORVASC) 10 MG tablet Take 1 tablet (10 mg total) by mouth daily.  30 tablet  3  . carvedilol (COREG) 25 MG tablet Take 1 tablet (25 mg total) by mouth 2 (two) times daily.  60 tablet  3  . cyclobenzaprine (FLEXERIL) 5 MG tablet Take 1 tablet (5 mg total) by mouth 3 (three) times daily as needed for muscle spasms (please give 1st dose now).  30 tablet  0  . furosemide (LASIX) 20 MG tablet Take 1 tablet (20 mg total) by mouth daily as needed for fluid or edema (shortness of breath).  30 tablet  1  . levofloxacin (LEVAQUIN) 500  MG tablet Take 1 tablet (500 mg total) by mouth daily.  7 tablet  0  . lisinopril (PRINIVIL,ZESTRIL) 20 MG tablet Take 1 tablet (20 mg total) by mouth daily.  30 tablet  3  . hydrALAZINE (APRESOLINE) 50 MG tablet 1 and 1/2 tablets (75mg ) three times a day  135 tablet  3  . spironolactone (ALDACTONE) 25 MG tablet Take 1 tablet (25 mg total) by mouth daily.  30 tablet  3   No current facility-administered medications for this visit.    BP 204/118  Pulse 79  Ht 6' (1.829 m)  Wt 157 lb 12.8 oz (71.578 kg)  BMI 21.40 kg/m2 General: NAD Neck: No JVD, no thyromegaly or thyroid nodule.  Lungs: Clear to auscultation bilaterally with normal respiratory  effort. CV: Nondisplaced PMI.  Heart regular S1/S2, +S4, 2/6 diastolic murmur along the sternal border.  No peripheral edema.  No carotid bruit.  Normal pedal pulses.  Abdomen: Soft, nontender, no hepatosplenomegaly, no distention.  Skin: Intact without lesions or rashes.  Neurologic: Alert and oriented x 3.  Psych: Normal affect. Extremities: No clubbing or cyanosis.  HEENT: Normal.   Assessment/Plan: 1. Hypertension: Strong family history of HTN.  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   BP is still very high.  He was on higher doses of BP meds in the past.  He does not screen positive for OSA.  Says he has been taking meds but ran out today (did take his am meds).  - I will refill amlodipine, Coreg, and lisinopril.  - Increase hydralazine to 75 mg tid.  - Add spironolactone 25 mg daily given resistant HTN.  - BMET in 10 days.  - He will check BP daily and we will call for BP check in 2 wks.  - Followup with PA in 1 month, followup with me in 2 months.  2. Cardiomyopathy: EF 45-50% on last echo.  He is not volume overloaded.  Suspect hypertensive +/- ETOH cardiomyopathy.  Prior negative Cardiolite.  Would avoid cath with CKD.   - Continue Coreg and lisinopril. - Adding spironolactone.  - On hydralazine but unable to tolerate Imdur due to headache.  3. CKD: Hypertensive nephrosclerosis.  I will refer him back to nephrology.  Needs to be in their system.  Needs BP control to try to avoid progression of CKD.  4. Smoking: I strongly encouraged him to quit.  5. ETOH abuse: I congratulated him on quitting.  6. Aortic insufficiency: Moderate on last echo.  Needs BP control to try to limit progression.  Would repeat echo in 12/15.   [name] 12/04/2013

## 2013-12-18 ENCOUNTER — Other Ambulatory Visit: Payer: Medicare Other

## 2013-12-19 ENCOUNTER — Telehealth: Payer: Self-pay | Admitting: *Deleted

## 2013-12-19 NOTE — Telephone Encounter (Signed)
Copied from Dr Claris Gladden 12/04/13 office note:   Hypertension: Strong family history of HTN. 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 BP is still very high. He was on higher doses of BP meds in the past. He does not screen positive for OSA. Says he has been taking meds but ran out today (did take his am meds).  - I will refill amlodipine, Coreg, and lisinopril.  - Increase hydralazine to 75 mg tid.  - Add spironolactone 25 mg daily given resistant HTN.  - BMET in 10 days.  - He will check BP daily and we will call for BP check in 2 wks.    12/19/13 LMTCB

## 2013-12-21 NOTE — Telephone Encounter (Signed)
LMTCB

## 2013-12-27 ENCOUNTER — Telehealth: Payer: Self-pay | Admitting: Cardiology

## 2013-12-27 NOTE — Telephone Encounter (Signed)
error 

## 2013-12-27 NOTE — Telephone Encounter (Signed)
Follow Up    Mel Almond calling from Dr. Royce Macadamia office and requesting Med Clearance Sheet be faxed to [phone] (AttnMel Almond)  ASAP due to pt being scheduled for procedure 12/28/13 am

## 2013-12-27 NOTE — Telephone Encounter (Signed)
Dr Jodene Nam requesting clearance from Dr Aundra Dubin to extract 6 teeth 12/28/13.   I will forward to Dr Aundra Dubin for review and recommendations.

## 2013-12-27 NOTE — Telephone Encounter (Signed)
I spoke with pt, he is unsure of his BP readings since office visit with Dr Aundra Dubin 12/04/13.  He is in the car and states he has the BP readings written down at home.  He states he has his medication and has been taking it regularly. He states he needs several teeth extracted and is having severe pain because of his teeth.  Fax received from Dr Diona Browner DMD,PA 12/26/13 requesting clearance for proposed treatment- extract 6 teeth- states BP 185/125 12/26/13.  Pt states his BP is not going to get any better until he has something done about his teeth.  Pt to call me back when he gets home to give me BP readings and review medications.

## 2013-12-27 NOTE — Telephone Encounter (Signed)
Received request from Nurse fax box, documents faxed for surgical clearance. To: Gae Bon Fax [phone] Attention: [phone]/km

## 2013-12-27 NOTE — Telephone Encounter (Signed)
Spoke with patient--BP last 4 days-- 139/115 128/99 111/78 120/93.

## 2013-12-27 NOTE — Telephone Encounter (Signed)
[facility] for dental procedure to extract 6 teeth per Dr Aundra Dubin. Pt advised. Request for Medical Clearance from Dr Hoyt Koch completed by Dr Aundra Dubin with this information, form returned to HIM to be faxed to Dr Hoyt Koch.

## 2013-12-27 NOTE — Telephone Encounter (Signed)
I reviewed his BP medications with him and he states he is taking them regularly.  Pt denies any symptoms except severe pain from his teeth.   He states he is not resting and getting only 2-3 hours sleep at night because of the pain  He is scheduled to have 6 dental extractions tomorrow.

## 2013-12-29 NOTE — Telephone Encounter (Signed)
Nothing was typed/tmj 

## 2013-12-30 IMAGING — CR DG CHEST 2V
2 series · 2 of 2 positions shown · non-contrast
Comparison: 03/31/2011.

CLINICAL DATA: Dyspnea.  Productive cough.  Smoker.

CHEST - 2 VIEW

[w chest pa]
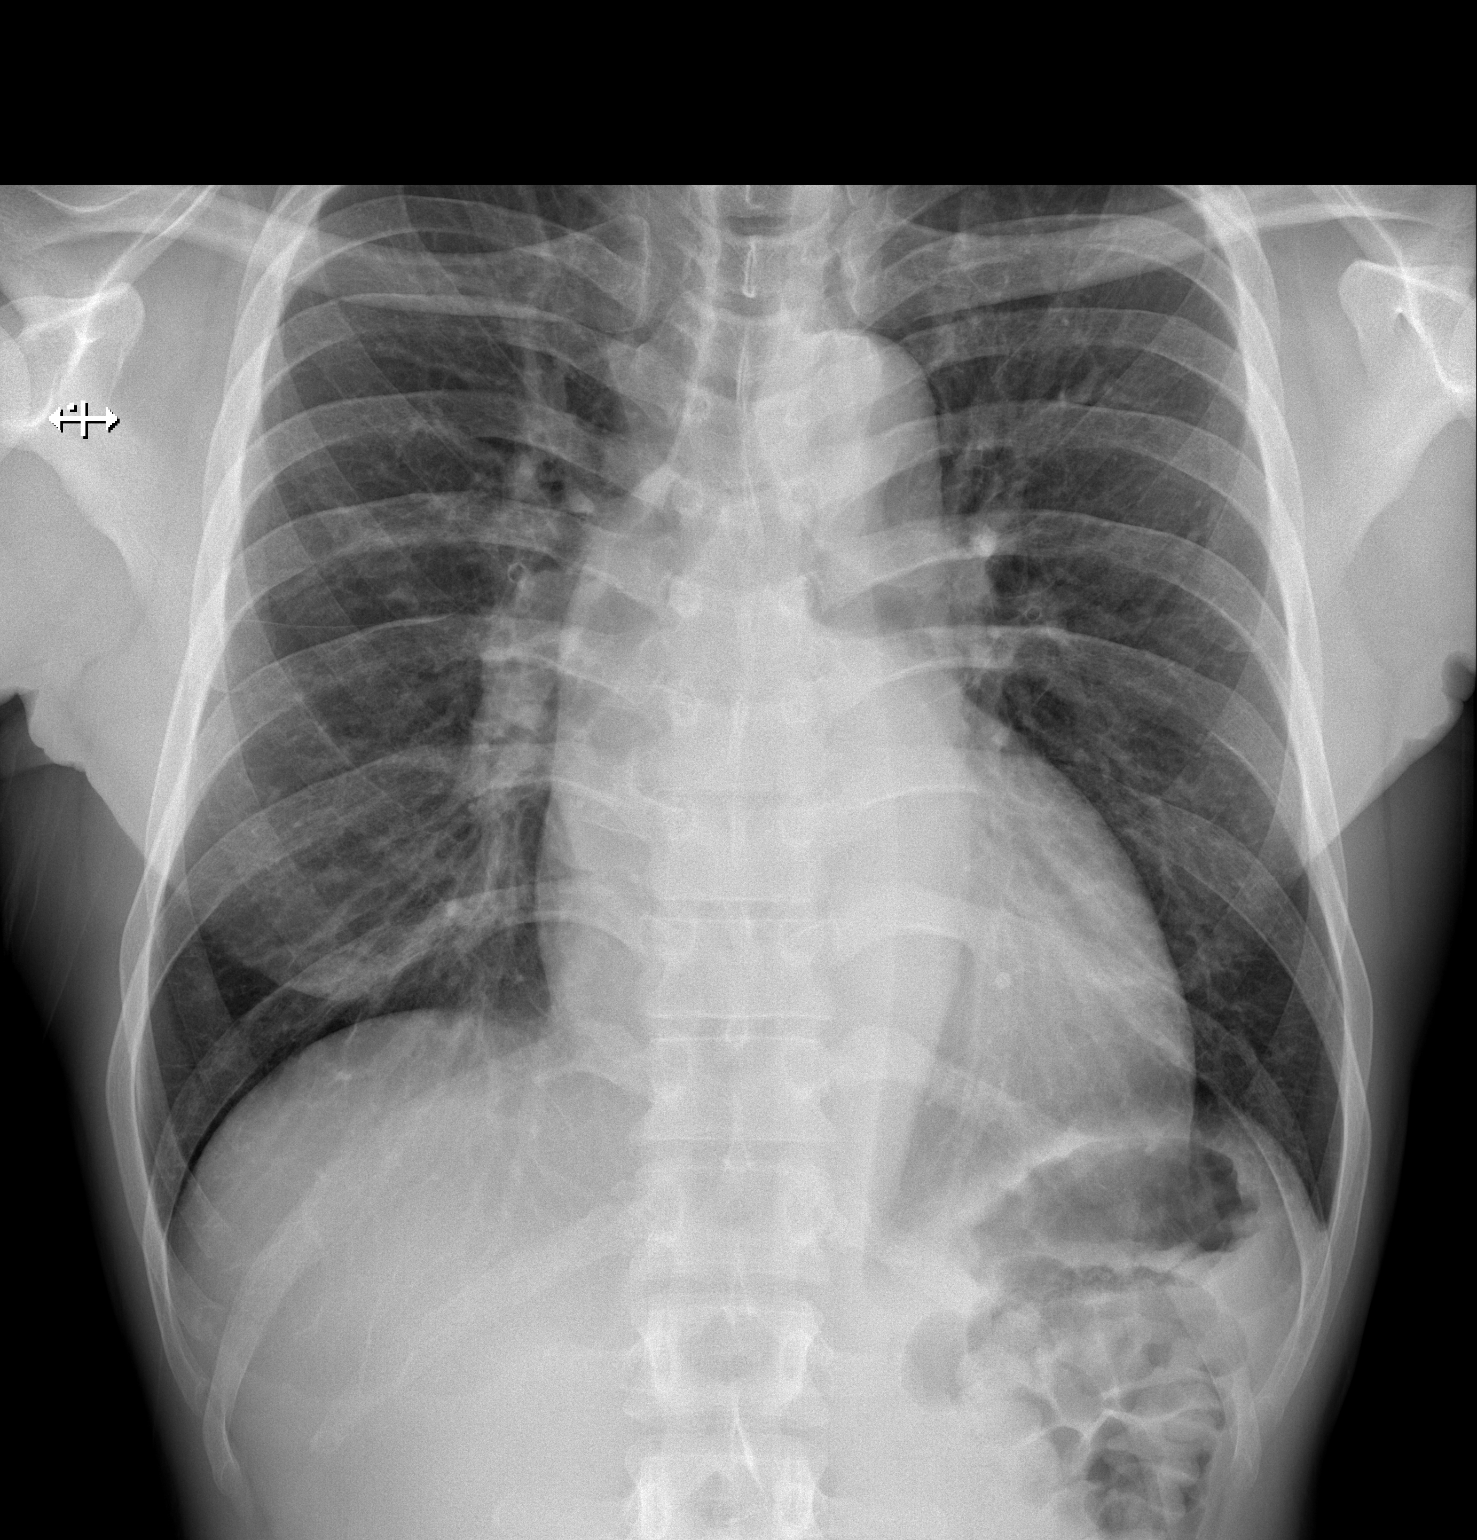

[w chest lat]
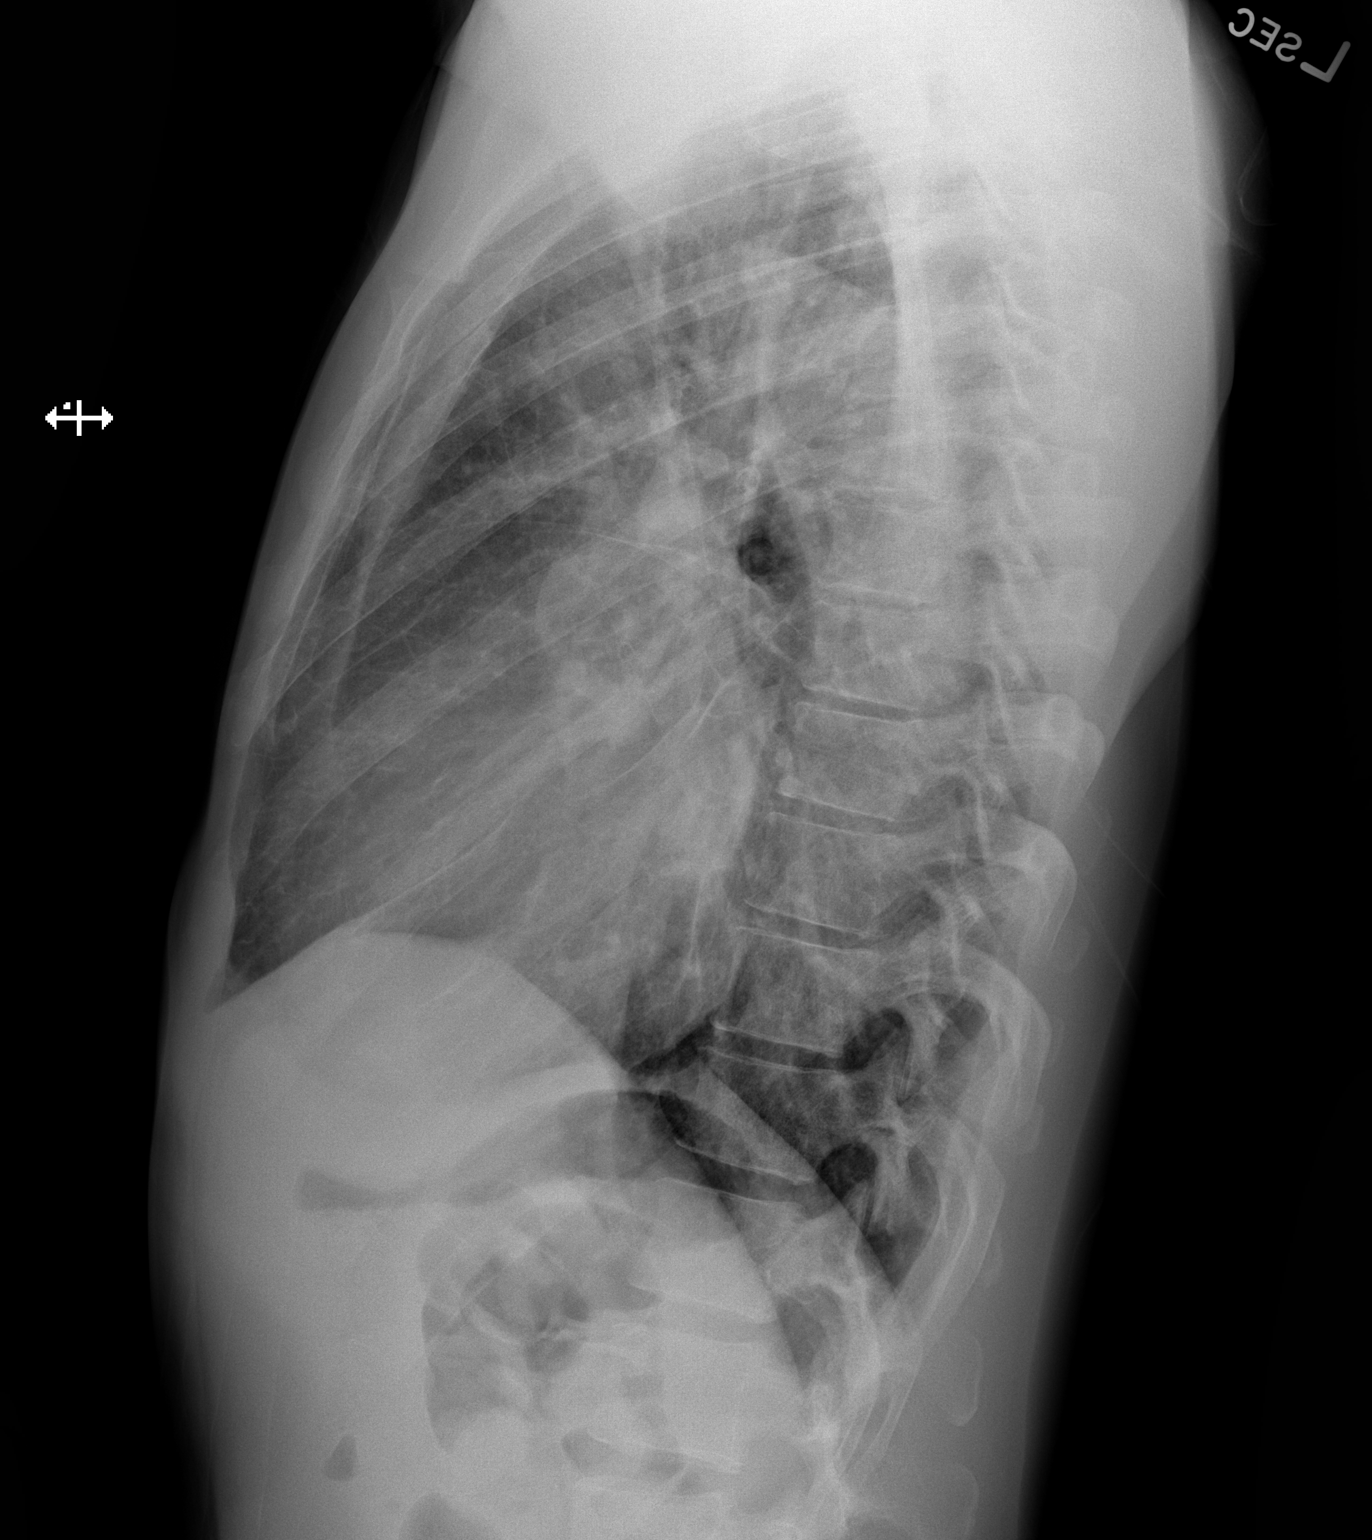

[2 of 2 positions shown; findings below may reference images not displayed]

FINDINGS: Stable enlarged cardiac silhouette.  Clear lungs.
Minimal central peribronchial thickening.  Unremarkable bones.
IMPRESSION: 1.  Minimal bronchitic changes.
2.  Stable cardiomegaly.

## 2014-01-03 ENCOUNTER — Ambulatory Visit: Payer: Medicare Other | Admitting: Nurse Practitioner

## 2014-01-16 ENCOUNTER — Ambulatory Visit: Payer: Medicare Other | Admitting: Nurse Practitioner

## 2014-01-17 IMAGING — CT CT HEAD W/O CM
1 series · 16 of 30 positions shown, 20 images · non-contrast
Comparison: 09/09/2010.

CLINICAL DATA: Uncontrolled hypertension.

CT HEAD WITHOUT CONTRAST
TECHNIQUE: Contiguous axial images were obtained from the base of
the skull through the vertex without contrast.

[Series 3: head routine 4.8 h37s · axial · 0.43mm/px · z∈[+355,+509]mm · 16 of 36 slices shown, 20 images]
[im 2/36  brain]
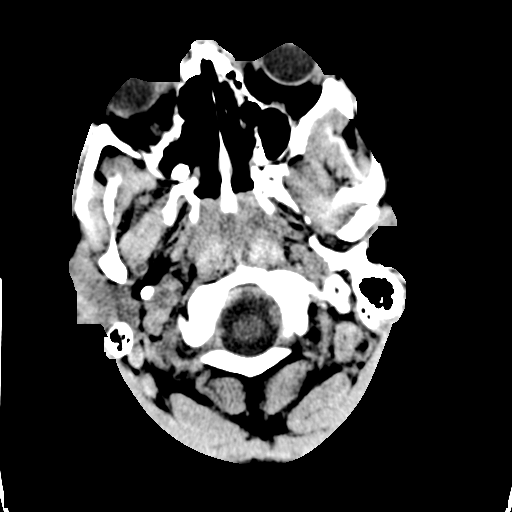
[im 2/36  bone]
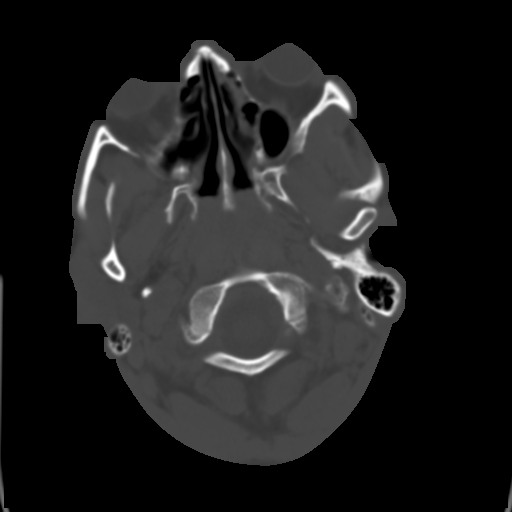
[im 4/36  brain]
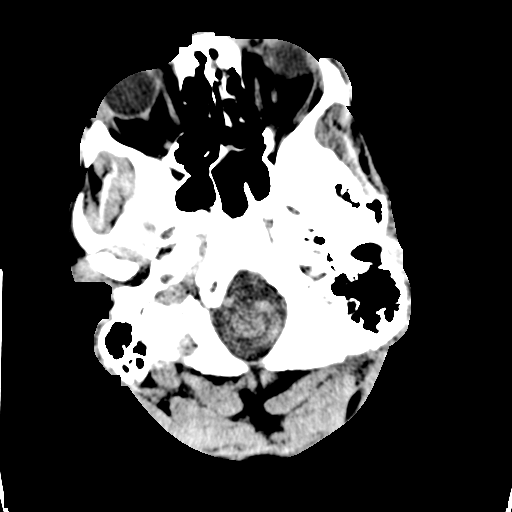
[im 7/36  brain]
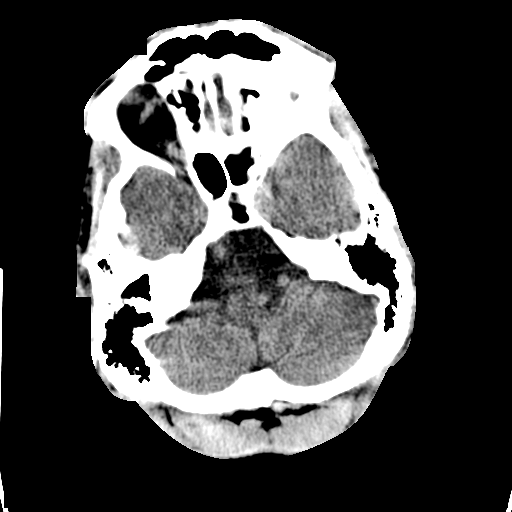
[im 9/36  brain]
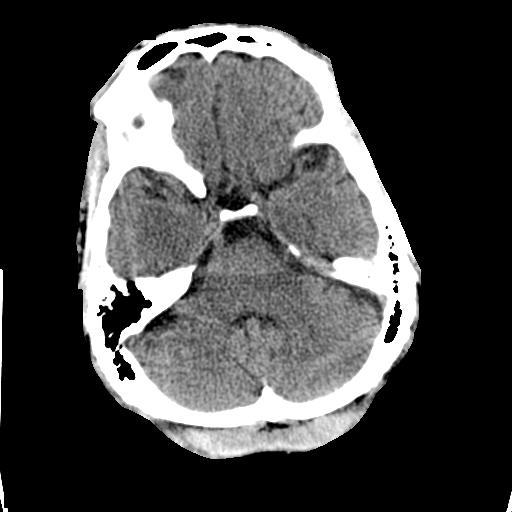
[im 10/36  brain]
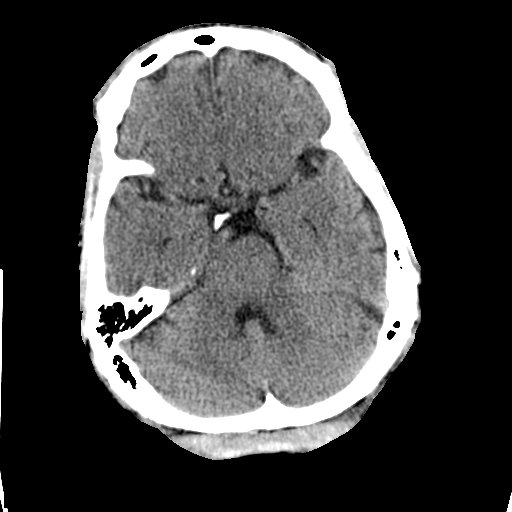
[im 10/36  bone]
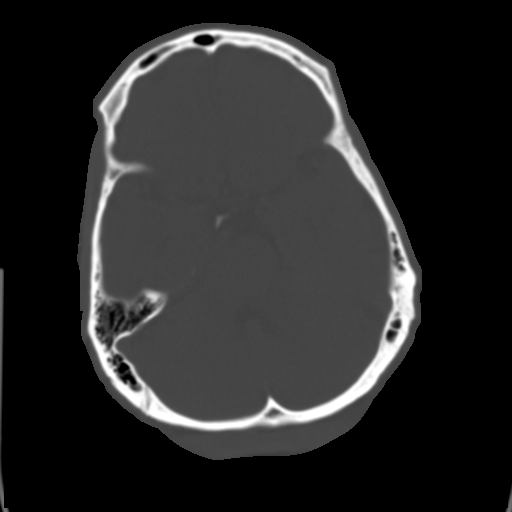
[im 13/36  brain]
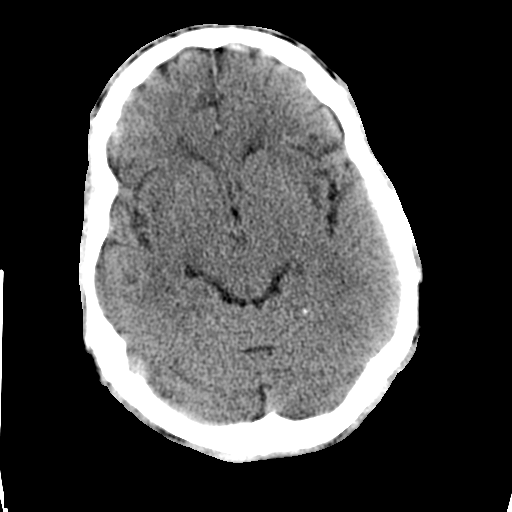
[im 15/36  brain]
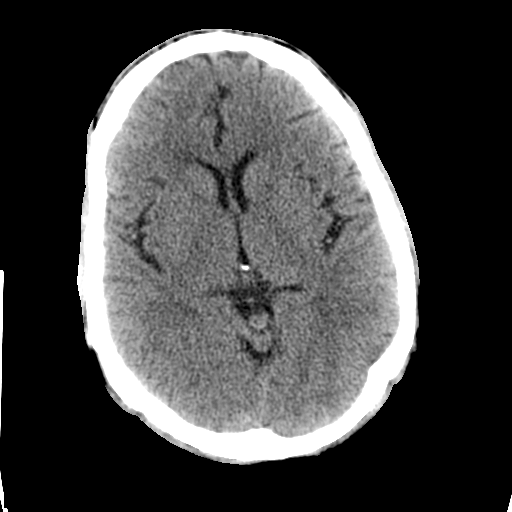
[im 17/36  brain]
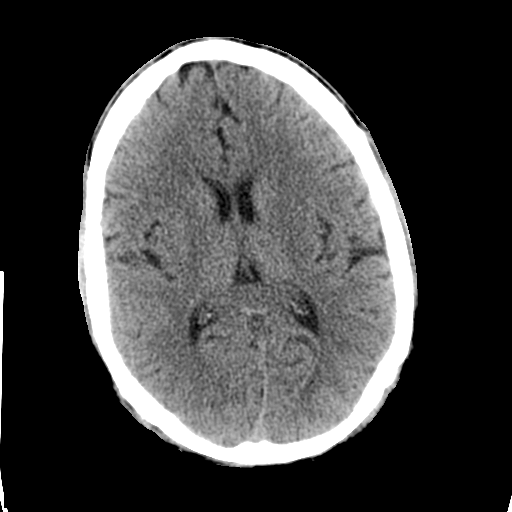
[im 19/36  brain]
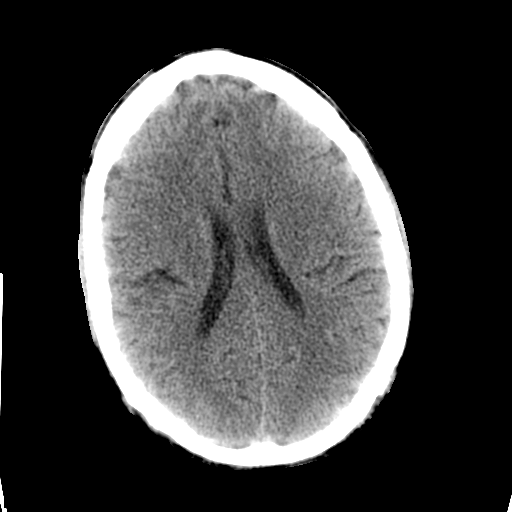
[im 19/36  bone]
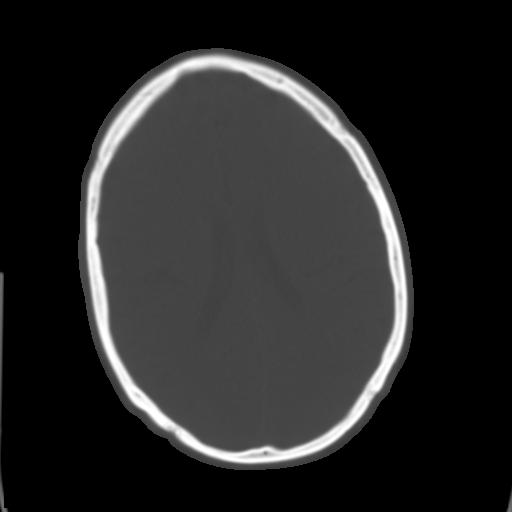
[im 21/36  brain]
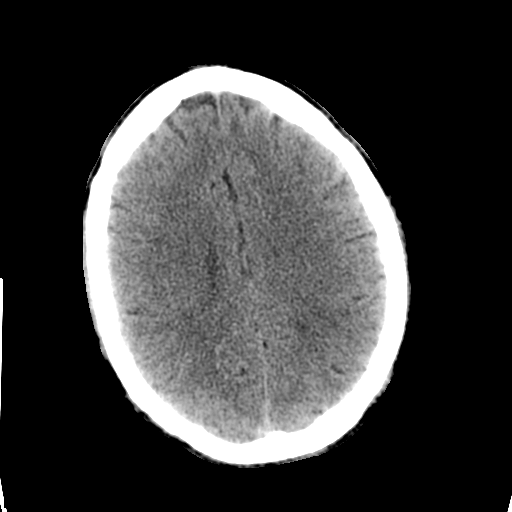
[im 23/36  brain]
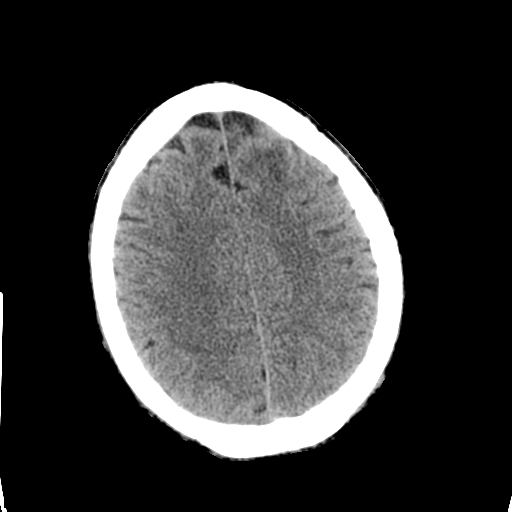
[im 26/36  brain]
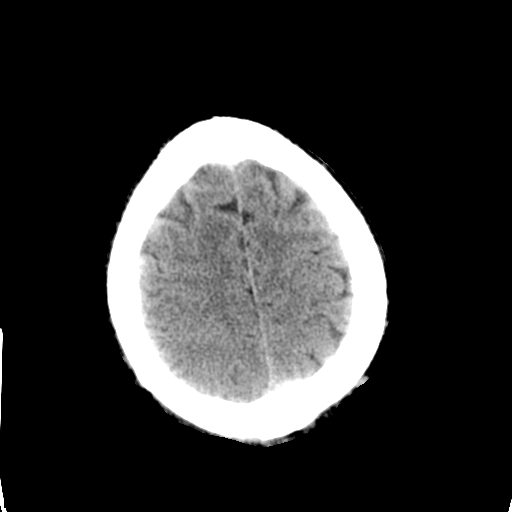
[im 27/36  brain]
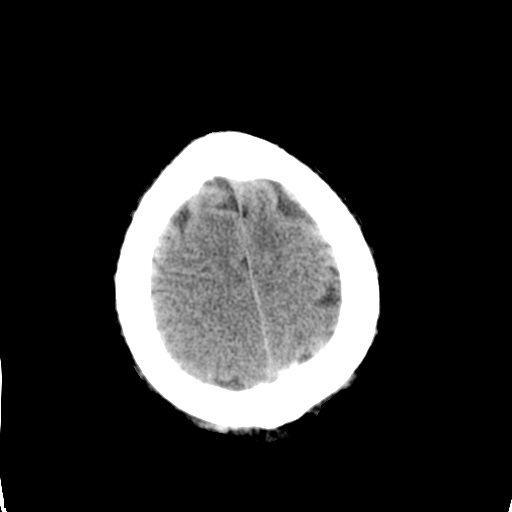
[im 27/36  bone]
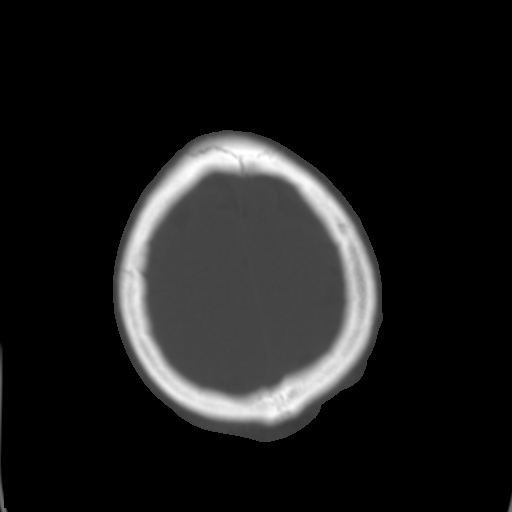
[im 29/36  brain]
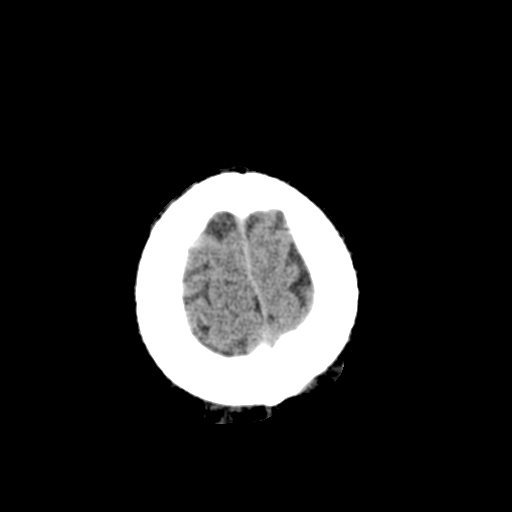
[im 32/36  brain]
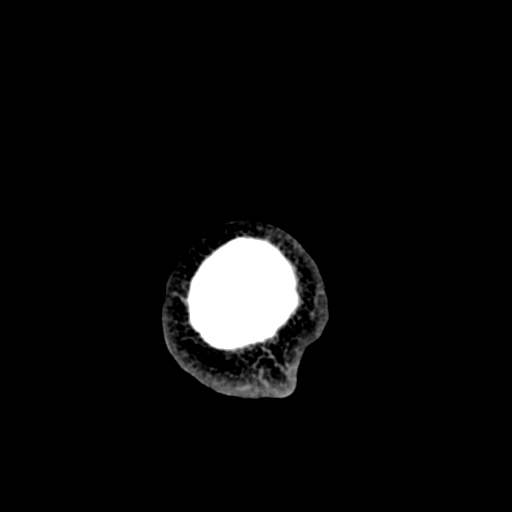
[im 34/36  brain]
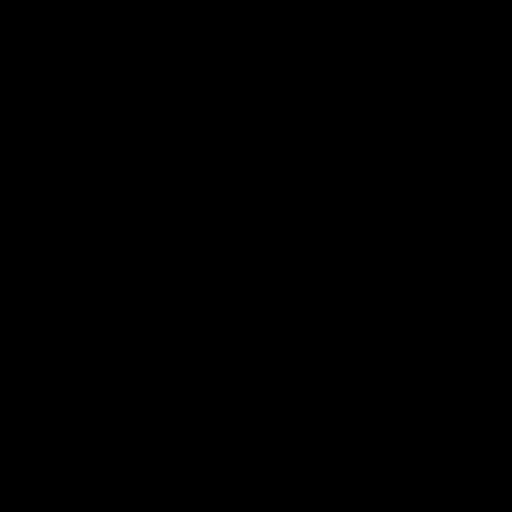

[16 of 30 positions shown; findings below may reference images not displayed]

FINDINGS: A small right posterior scalp density is again
demonstrated.  Minimal white matter low density in both frontal
lobes is unchanged.  Normal size and position of the ventricles.
No intracranial hemorrhage, mass lesion or CT evidence of acute
infarction.  Probable surgical absence of a portion of the medial
wall of the right maxillary sinus is again demonstrated.
IMPRESSION: 1.  No acute abnormality.
2.  Stable minimal chronic small vessel white matter ischemic
changes in both frontal lobes.

## 2014-01-17 IMAGING — CR DG ABDOMEN ACUTE W/ 1V CHEST
3 series · 3 of 3 positions shown · non-contrast
Comparison: Chest radiograph 05/04/2011

CLINICAL DATA: Abdominal pain, shortness of breath, worsening,
history hypertension

ACUTE ABDOMEN SERIES (ABDOMEN 2 VIEW & CHEST 1 VIEW)

[w chest pa]
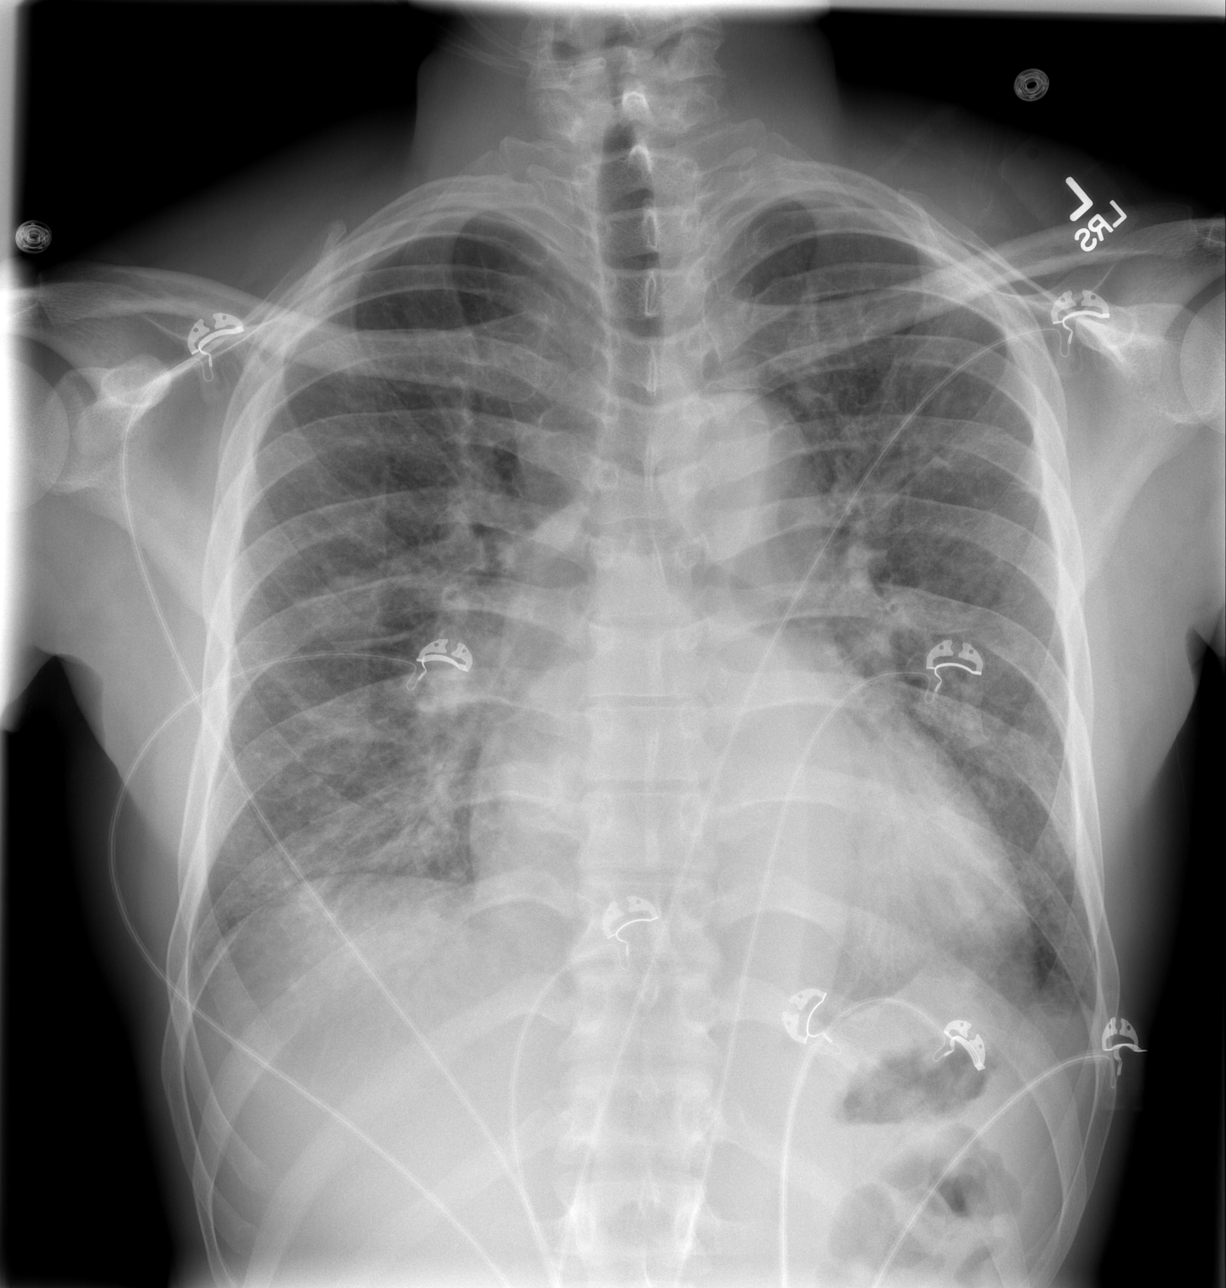

[w abdomen upright]
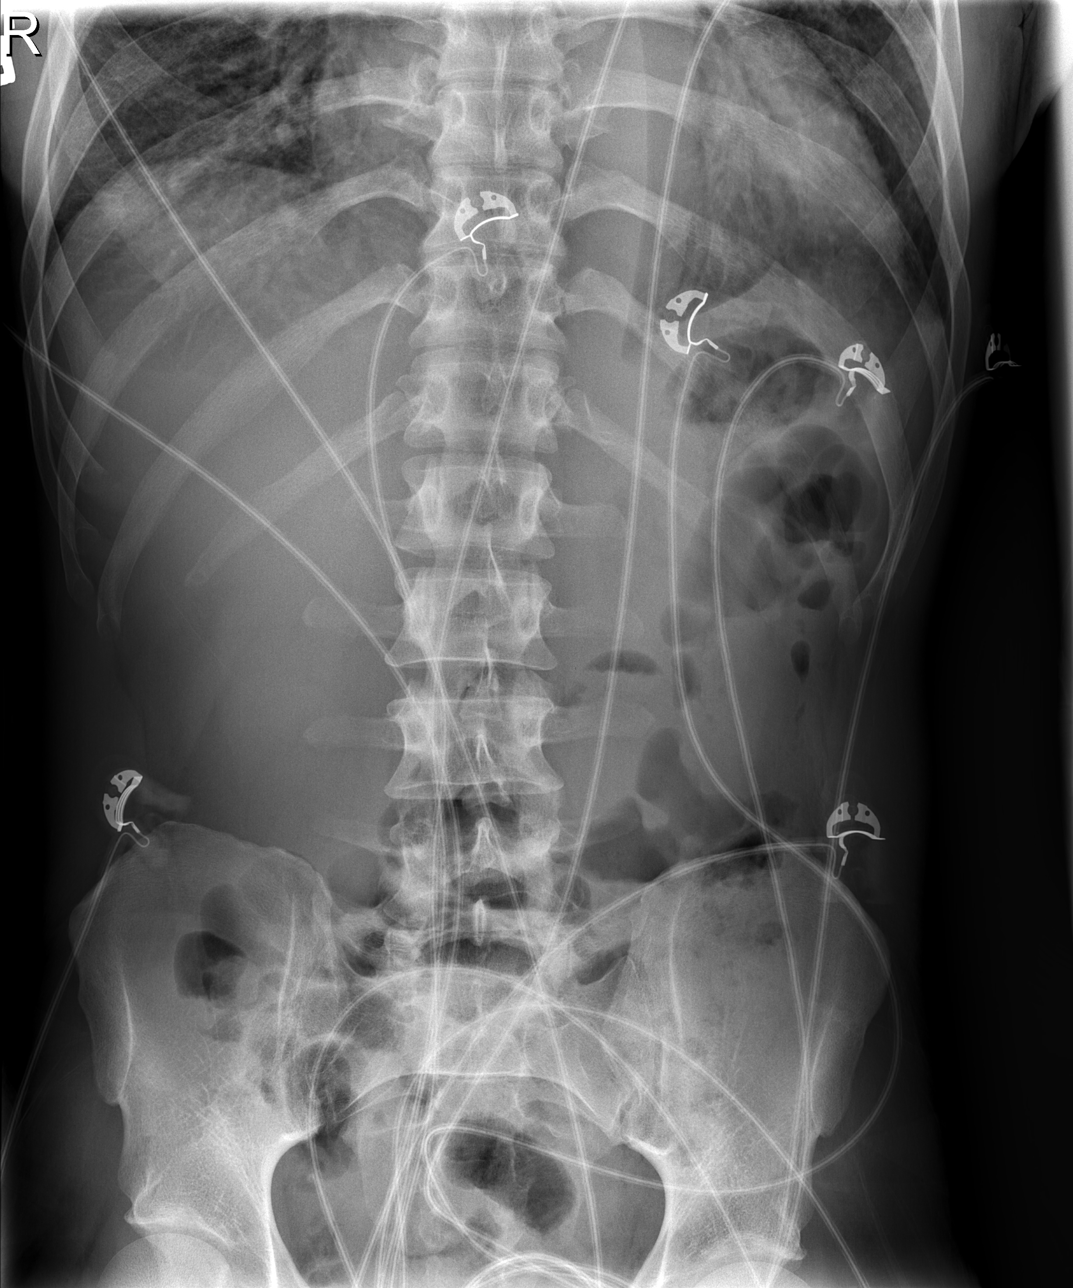

[t abdomen supine]
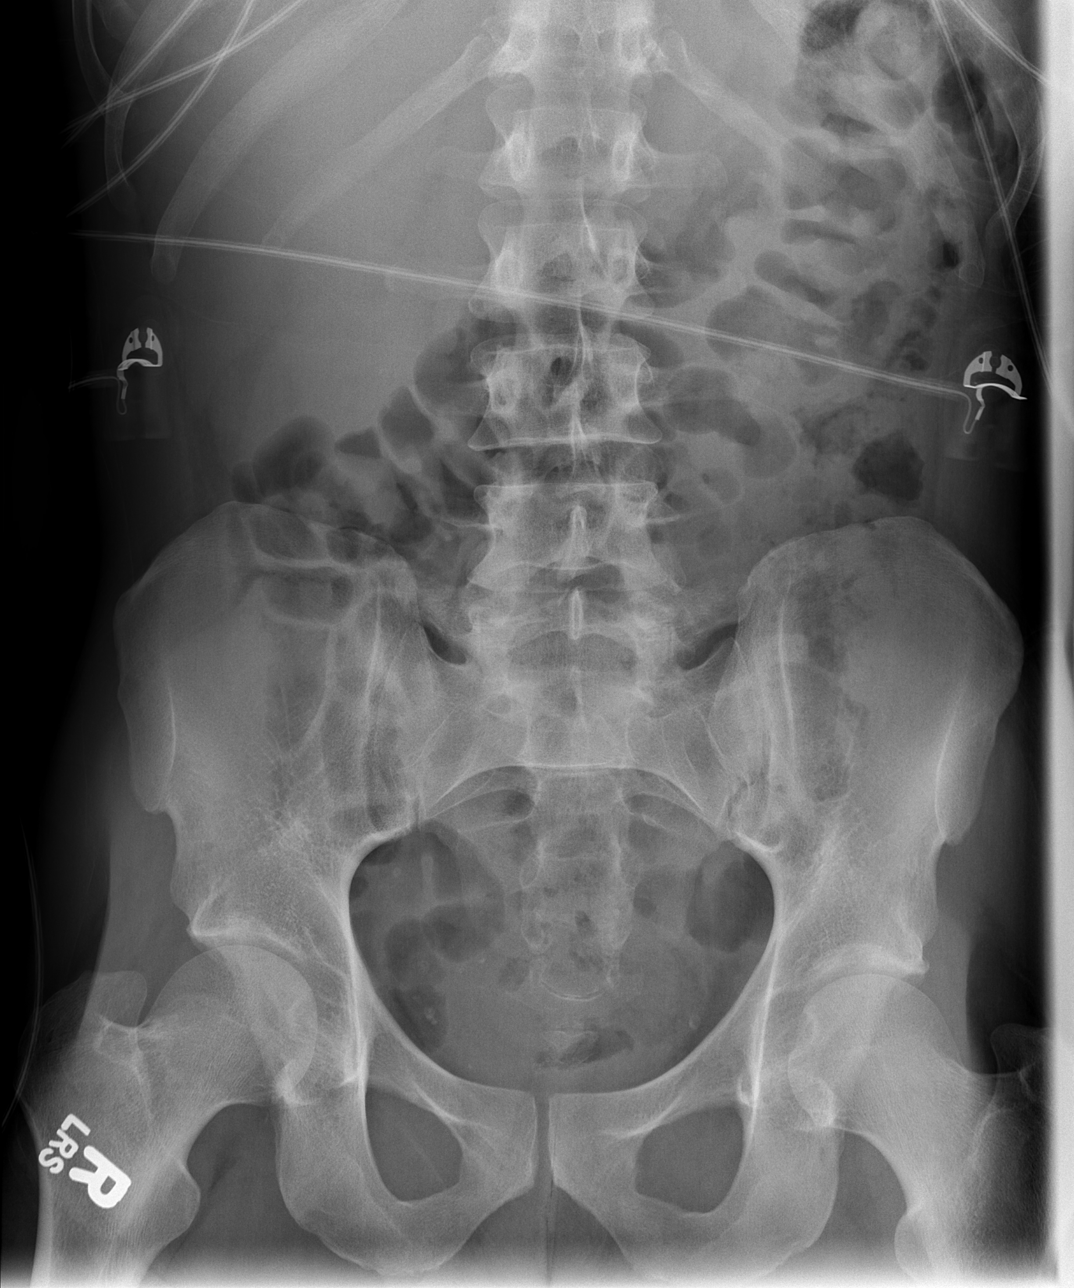

[3 of 3 positions shown; findings below may reference images not displayed]

FINDINGS: Enlargement of cardiac silhouette.
Pulmonary vascular congestion.
Tortuous aorta.
Bilateral perihilar infiltrates extending to lung bases, question
edema versus infection.
Small left pleural effusion.

Numerous cardiac monitoring lines project over chest and abdomen.
Nonobstructive bowel gas pattern.
No bowel dilatation, bowel wall thickening, or free intraperitoneal
air.
Bones unremarkable.
No urinary tract calcification.
Small pelvic phleboliths.
IMPRESSION: No acute abdominal findings.
Enlargement of cardiac silhouette with pulmonary vascular
congestion and diffuse bilateral pulmonary infiltrates, question
edema versus infection.
Small left pleural effusion.

## 2014-01-18 IMAGING — CR DG CHEST 2V
2 series · 2 of 2 positions shown · non-contrast
Comparison: Chest x-ray 05/04/2011.

CLINICAL DATA: Chest pain and shortness of breath.  History of
infection.

CHEST - 2 VIEW

[w chest pa]
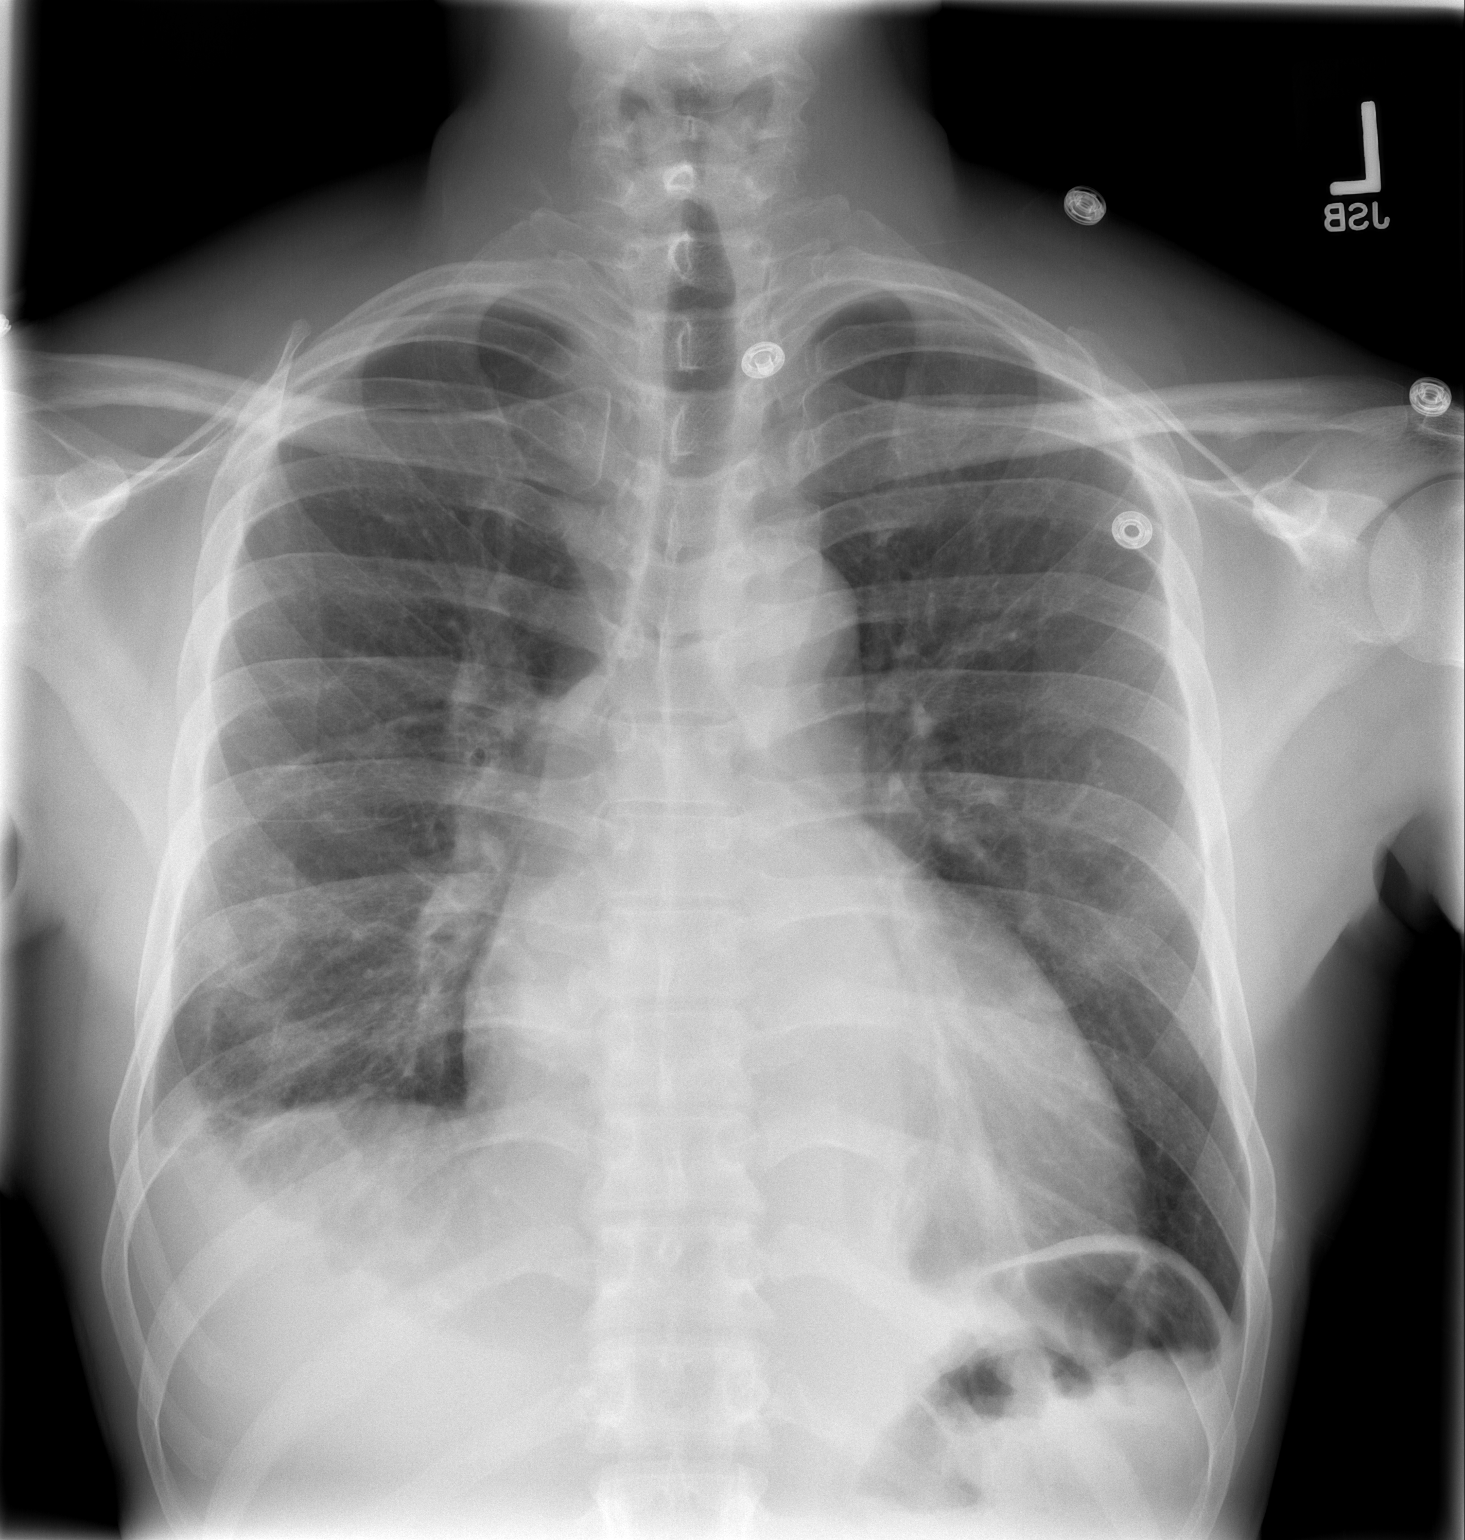

[w chest lat]
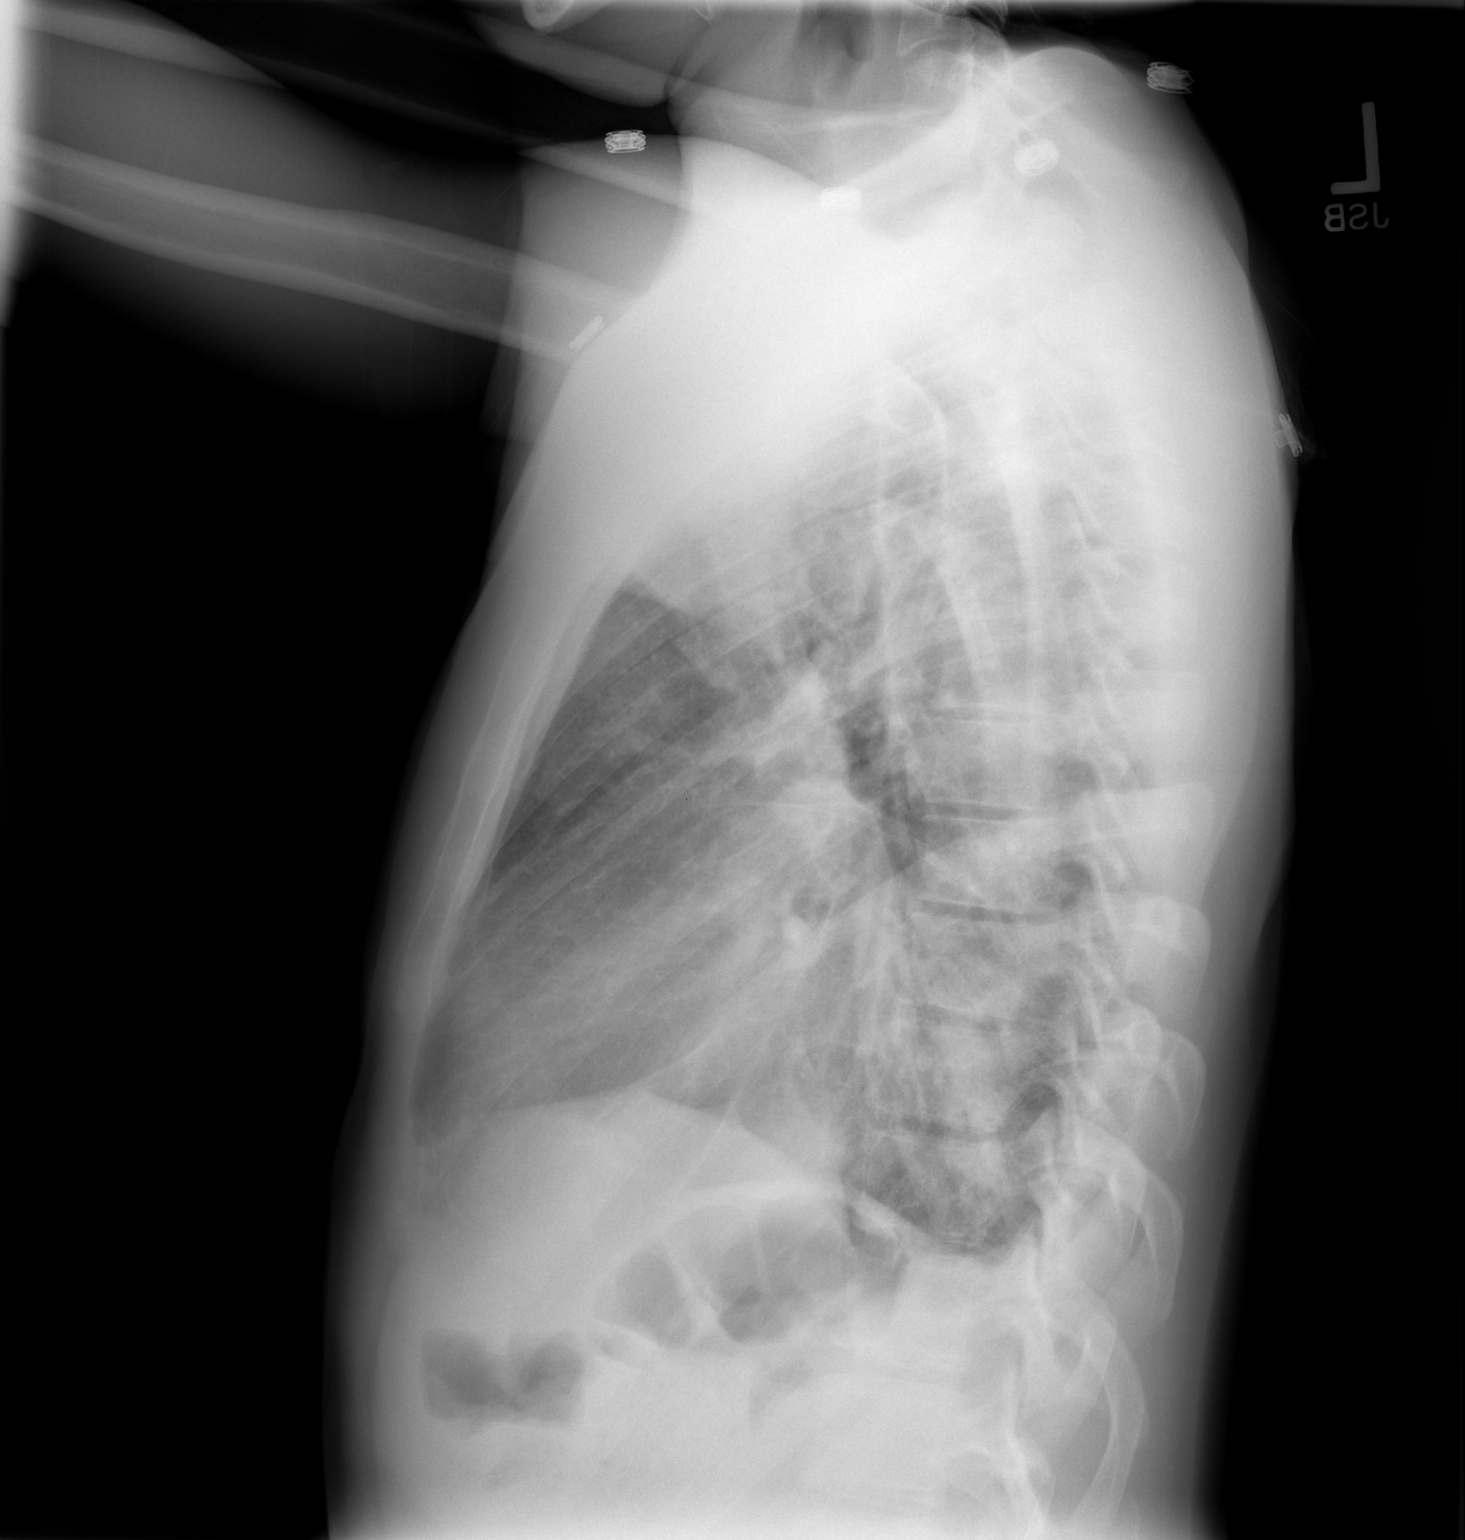

[2 of 2 positions shown; findings below may reference images not displayed]

FINDINGS: Compared to the recent prior examination, there has been
interval development of a small-moderate right-sided pleural
effusion.  Opacities in the left lower lobe are concerning for
airspace consolidation (either from aspiration or infection).
Linear opacities in the medial aspect of the left lung base likely
represent areas of subsegmental atelectasis, although early
endobronchial spread of infection is not excluded).  No definite
left-sided pleural effusion.  Pulmonary vasculature is normal.
Mild cardiomegaly with prominent left ventricular contour (which
suggests left ventricular hypertrophy), unchanged. The patient is
rotated to the left on today's exam, resulting in distortion of the
mediastinal contours and reduced diagnostic sensitivity and
specificity for mediastinal pathology.
IMPRESSION: 1.  Findings concerning for right lower lobe pneumonia with a small-
moderate right-sided parapneumonic pleural effusion.
2. Linear opacities in the medial aspect of the left lower lobe are
favored to represent areas of subsegmental atelectasis (less likely
early endobronchial spread of infection).
3.  Mild cardiomegaly with evidence of left ventricular
hypertrophy.

This was made a call report.

## 2014-01-29 ENCOUNTER — Other Ambulatory Visit: Payer: Self-pay | Admitting: *Deleted

## 2014-02-01 ENCOUNTER — Ambulatory Visit: Payer: Medicare Other | Admitting: Cardiology

## 2014-02-23 ENCOUNTER — Other Ambulatory Visit: Payer: Self-pay

## 2014-02-23 ENCOUNTER — Emergency Department (HOSPITAL_COMMUNITY)
Admission: EM | Admit: 2014-02-23 | Discharge: 2014-02-23 | Disposition: A | Discharge status: Home / Self Care | Payer: Medicare Other | Attending: Emergency Medicine | Admitting: Emergency Medicine

## 2014-02-23 ENCOUNTER — Encounter (HOSPITAL_COMMUNITY): Payer: Self-pay | Admitting: Emergency Medicine

## 2014-02-23 ENCOUNTER — Emergency Department (HOSPITAL_COMMUNITY): Payer: Medicare Other

## 2014-02-23 DIAGNOSIS — Z8639 Personal history of other endocrine, nutritional and metabolic disease: Secondary | ICD-10-CM | POA: Diagnosis not present

## 2014-02-23 DIAGNOSIS — Z720 Tobacco use: Secondary | ICD-10-CM | POA: Diagnosis not present

## 2014-02-23 DIAGNOSIS — Z8709 Personal history of other diseases of the respiratory system: Secondary | ICD-10-CM | POA: Insufficient documentation

## 2014-02-23 DIAGNOSIS — R109 Unspecified abdominal pain: Secondary | ICD-10-CM | POA: Diagnosis not present

## 2014-02-23 DIAGNOSIS — Z8659 Personal history of other mental and behavioral disorders: Secondary | ICD-10-CM | POA: Insufficient documentation

## 2014-02-23 DIAGNOSIS — Z8739 Personal history of other diseases of the musculoskeletal system and connective tissue: Secondary | ICD-10-CM | POA: Insufficient documentation

## 2014-02-23 DIAGNOSIS — Z792 Long term (current) use of antibiotics: Secondary | ICD-10-CM | POA: Insufficient documentation

## 2014-02-23 DIAGNOSIS — I509 Heart failure, unspecified: Secondary | ICD-10-CM | POA: Insufficient documentation

## 2014-02-23 DIAGNOSIS — G43909 Migraine, unspecified, not intractable, without status migrainosus: Secondary | ICD-10-CM | POA: Insufficient documentation

## 2014-02-23 DIAGNOSIS — R05 Cough: Secondary | ICD-10-CM | POA: Diagnosis not present

## 2014-02-23 DIAGNOSIS — I129 Hypertensive chronic kidney disease with stage 1 through stage 4 chronic kidney disease, or unspecified chronic kidney disease: Secondary | ICD-10-CM | POA: Diagnosis not present

## 2014-02-23 DIAGNOSIS — Z8701 Personal history of pneumonia (recurrent): Secondary | ICD-10-CM | POA: Insufficient documentation

## 2014-02-23 DIAGNOSIS — B349 Viral infection, unspecified: Secondary | ICD-10-CM | POA: Diagnosis not present

## 2014-02-23 DIAGNOSIS — Z79899 Other long term (current) drug therapy: Secondary | ICD-10-CM | POA: Diagnosis not present

## 2014-02-23 DIAGNOSIS — G8929 Other chronic pain: Secondary | ICD-10-CM | POA: Insufficient documentation

## 2014-02-23 DIAGNOSIS — Z8719 Personal history of other diseases of the digestive system: Secondary | ICD-10-CM | POA: Insufficient documentation

## 2014-02-23 DIAGNOSIS — R0789 Other chest pain: Secondary | ICD-10-CM | POA: Diagnosis not present

## 2014-02-23 DIAGNOSIS — N189 Chronic kidney disease, unspecified: Secondary | ICD-10-CM | POA: Diagnosis not present

## 2014-02-23 DIAGNOSIS — R079 Chest pain, unspecified: Secondary | ICD-10-CM

## 2014-02-23 DIAGNOSIS — R011 Cardiac murmur, unspecified: Secondary | ICD-10-CM | POA: Diagnosis not present

## 2014-02-23 LAB — CBC
HCT: 38.1 % — ABNORMAL LOW (ref 39.0–52.0)
HEMOGLOBIN: 12.8 g/dL — AB (ref 13.0–17.0)
MCH: 30.3 pg (ref 26.0–34.0)
MCHC: 33.6 g/dL (ref 30.0–36.0)
MCV: 90.3 fL (ref 78.0–100.0)
Platelets: 153 10*3/uL (ref 150–400)
RBC: 4.22 MIL/uL (ref 4.22–5.81)
RDW: 14.1 % (ref 11.5–15.5)
WBC: 5.3 10*3/uL (ref 4.0–10.5)

## 2014-02-23 LAB — URINE MICROSCOPIC-ADD ON

## 2014-02-23 LAB — URINALYSIS, ROUTINE W REFLEX MICROSCOPIC
BILIRUBIN URINE: NEGATIVE
Glucose, UA: NEGATIVE mg/dL
Hgb urine dipstick: NEGATIVE
Ketones, ur: NEGATIVE mg/dL
Leukocytes, UA: NEGATIVE
NITRITE: NEGATIVE
PH: 5.5 (ref 5.0–8.0)
Protein, ur: 100 mg/dL — AB
SPECIFIC GRAVITY, URINE: 1.02 (ref 1.005–1.030)
UROBILINOGEN UA: 1 mg/dL (ref 0.0–1.0)

## 2014-02-23 LAB — BASIC METABOLIC PANEL
Anion gap: 15 (ref 5–15)
BUN: 13 mg/dL (ref 6–23)
CHLORIDE: 101 meq/L (ref 96–112)
CO2: 21 meq/L (ref 19–32)
Calcium: 9.1 mg/dL (ref 8.4–10.5)
Creatinine, Ser: 1.41 mg/dL — ABNORMAL HIGH (ref 0.50–1.35)
GFR calc Af Amer: 70 mL/min — ABNORMAL LOW (ref >90)
GFR, EST NON AFRICAN AMERICAN: 61 mL/min — AB (ref >90)
GLUCOSE: 95 mg/dL (ref 70–99)
POTASSIUM: 3.6 meq/L — AB (ref 3.7–5.3)
SODIUM: 137 meq/L (ref 137–147)

## 2014-02-23 LAB — I-STAT TROPONIN, ED
TROPONIN I, POC: 0.01 ng/mL (ref 0.00–0.08)
TROPONIN I, POC: 0 ng/mL (ref 0.00–0.08)

## 2014-02-23 MED ORDER — MORPHINE SULFATE 4 MG/ML IJ SOLN
4.0000 mg | Freq: Once | INTRAMUSCULAR | Status: AC
  Administered 2014-02-23: 4 mg via INTRAVENOUS
  Filled 2014-02-23: qty 1

## 2014-02-23 MED ORDER — NITROGLYCERIN 0.4 MG SL SUBL
0.4000 mg | SUBLINGUAL_TABLET | SUBLINGUAL | Status: DC | PRN
  Administered 2014-02-23: 0.4 mg via SUBLINGUAL
  Filled 2014-02-23: qty 1

## 2014-02-23 MED ORDER — IBUPROFEN 800 MG PO TABS
800.0000 mg | ORAL_TABLET | Freq: Once | ORAL | Status: AC
  Administered 2014-02-23: 800 mg via ORAL
  Filled 2014-02-23: qty 1

## 2014-02-23 MED ORDER — ASPIRIN 81 MG PO CHEW
324.0000 mg | CHEWABLE_TABLET | Freq: Once | ORAL | Status: AC
  Administered 2014-02-23: 324 mg via ORAL
  Filled 2014-02-23: qty 4

## 2014-02-23 MED ORDER — HYDROCODONE-ACETAMINOPHEN 5-325 MG PO TABS: 1.0000 | ORAL_TABLET | Freq: Four times a day (QID) | ORAL | Status: DC | PRN

## 2014-02-23 MED ORDER — OXYCODONE-ACETAMINOPHEN 5-325 MG PO TABS
1.0000 | ORAL_TABLET | Freq: Once | ORAL | Status: AC
  Administered 2014-02-23: 1 via ORAL
  Filled 2014-02-23: qty 1

## 2014-02-23 NOTE — ED Provider Notes (Signed)
43 year old male, history of hypertension and mild cardiomyopathy with ejection fraction of around 45%. Presents with 2 complaints being chronic right-sided chest pain with some radiation to the right arm as well as some left lower back pain and side pain which he states is worse when he walks and stands, better when he is resting. On exam he has some tenderness to palpation in the left lower back and left side area, there is no rashes, the pain is reproducible to the patient. His EKG is very abnormal but is unchanged from prior EKGs, his lung sounds are normal, he has a stable murmur related to his underlying heart disease, the patient states that this pain is chronic and has been there for over a year. Of note the patient was admitted to the hospital in June for hypertension, notes from a April 2013 stress test related no ischemia. The patient has an unchanged EKG today, check troponin, check urinalysis, treat pain, anticipate discharge.    Medical screening examination/treatment/procedure(s) were conducted as a shared visit with non-physician practitioner(s) and myself.  I personally evaluated the patient during the encounter.  Clinical Impression:   Final diagnoses:  Chest pain, unspecified chest pain type  Right flank pain  Viral illness         Johnna Acosta, MD 02/24/14 [phone]

## 2014-02-23 NOTE — Discharge Instructions (Signed)
Call your cardiologist for further evaluation and possible outpatient stress test. Call your primary care provider for further evaluation of your left sided abdominal, flank pain. This is likely due to a musculoskeletal issue. Your urinalysis was negative for infection or blood. Take ibuprofen or over-the-counter medication for discomfort. Take Mucinex for your nasal congestion and upper respiratory symptoms.

## 2014-02-23 NOTE — ED Notes (Signed)
Pt. reports pain across his chest radiating to right arm onset 2 weeks ago with nausea and vomitting , denies SOB , pt. also reported left flank pain onset 1 month ago.

## 2014-02-23 NOTE — ED Provider Notes (Signed)
CSN: [id number]     Arrival date & time 02/23/14  [id number] History   First MD Initiated Contact with Patient 02/23/14 [phone]     Chief Complaint  Patient presents with  . Chest Pain     (Consider location/radiation/quality/duration/timing/severity/associated sxs/prior Treatment) HPI Comments: The patient is a 43 year old male with past medical history of hypertension, CK D, CHF, valvular heartdisease moderate to severe MR, presenting to the emergency room chief complaint of chest discomfort for one week, left flank pain for 2 weeks ago.  The patient states chest discomfort as constant as well as coming and going, radiating to right upper extremity, discomfort described as pressure. He denies shortness of breath, palpitations or lower extremity edema. Reports associated cough and congestion. Patient denies fever. Patient also complains of left sided abdominal pain for 2 weeks. Reports discomfort from left hip into left flank.  Patient reports discomfort worsened with movement and body position. Denies history of renal calculi, hematuria, dysuria, constipation, diarrhea. Patient denies history of IV drug use, cocaine use, palpitations, lower extremity edema. Patient reports compliance with antihypertensive medication. Cardiology: Aundra Dubin  Patient is a 43 y.o. male presenting with chest pain. The history is provided by the patient. No language interpreter was used.  Chest Pain Associated symptoms: abdominal pain, cough, nausea and vomiting   Associated symptoms: no back pain, no fever, no palpitations and no shortness of breath     Past Medical History  Diagnosis Date  . Hypertension     a. Hx of HTN urgency secondary to noncompliance. b. urinary metanephrine and catecholeamine levels normal 2013.  [name] Anxiety   . GERD (gastroesophageal reflux disease)   . Acute renal failure     June 2012 felt secondary to Toradol  . Chronic renal failure   . CHF (congestive heart failure)     a. 05/2011: Adm with  pulm edema/HTN urgency, EF 35-40% with diffuse hypokinesis and moderate to severe mitral regurgitation. Cardiomyopathy likely due to uncontrolled HTN and ETOH abuse - cath deferred due to renal insufficiency (felt due to uncontrolled HTN). bJodie Echevaria MV 06/2011: EF 37% and no ischemia or infarction. c. EF 45-50% by echo 01/2012.  . Cardiomyopathy   . HYPERLIPIDEMIA   . INGUINAL HERNIA   . ETOH abuse     a. Reported to have quit 05/2011.  . Valvular heart disease     a. Echo 05/2011: moderate to severe eccentric MR and mild to moderate AI with prolapsing left coronary cusp. b. Echo 01/2012: mild-mod AI, mild dilitation of aortic root, mild MR.  . CKD (chronic kidney disease)     a. 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  . Tobacco abuse   . Pneumonia ~ 2013  . Headache(784.0)     "q other day" (08/08/2013)  . Migraine     "twice/month" (08/08/2013)  . Chronic sinusitis   . History of medication noncompliance   . Chronic abdominal pain   . DDD (degenerative disc disease), lumbar    Past Surgical History  Procedure Laterality Date  . Ankle surgery      Fractures bilaterally  . Inguinal hernia repair Right ~ 1996  . Foot fracture surgery Bilateral 2004-2010    "got pins in both of them"   Family History  Problem Relation Age of Onset  . Hypertension    . Colon cancer Paternal Uncle    History  Substance Use Topics  . Smoking status: Current Some Day Smoker -- 0.25 packs/day for 23 years    Types: Cigarettes  .  Smokeless tobacco: Former Systems developer    Types: Chew  . Alcohol Use: 6.6 oz/week    11 Shots of liquor per week     Comment: 08/08/2013 "used to drink 1 pint of gin a week, now drink ~ 1 pint gin/month"    Review of Systems  Constitutional: Negative for fever and chills.  HENT: Positive for congestion.   Respiratory: Positive for cough. Negative for shortness of breath and wheezing.   Cardiovascular: Positive for chest pain. Negative for palpitations and leg swelling.  Gastrointestinal:  Positive for nausea, vomiting and abdominal pain. Negative for constipation.  Musculoskeletal: Positive for myalgias. Negative for back pain.  Skin: Negative for wound.      Allergies  Imdur  Home Medications   Prior to Admission medications   Medication Sig Start Date End Date Taking? Authorizing Provider  amLODipine (NORVASC) 10 MG tablet Take 1 tablet (10 mg total) by mouth daily. 12/04/13   Larey Dresser, MD  carvedilol (COREG) 25 MG tablet Take 1 tablet (25 mg total) by mouth 2 (two) times daily. 12/04/13   Larey Dresser, MD  cyclobenzaprine (FLEXERIL) 5 MG tablet Take 1 tablet (5 mg total) by mouth 3 (three) times daily as needed for muscle spasms (please give 1st dose now). 05/06/13   Waldemar Dickens, MD  furosemide (LASIX) 20 MG tablet Take 1 tablet (20 mg total) by mouth daily as needed for fluid or edema (shortness of breath). 09/12/13   Ejiroghene Arlyce Dice, MD  hydrALAZINE (APRESOLINE) 50 MG tablet 1 and 1/2 tablets (75mg ) three times a day 12/04/13   Larey Dresser, MD  levofloxacin (LEVAQUIN) 500 MG tablet Take 1 tablet (500 mg total) by mouth daily. 09/12/13   Ejiroghene Arlyce Dice, MD  lisinopril (PRINIVIL,ZESTRIL) 20 MG tablet Take 1 tablet (20 mg total) by mouth daily. 12/04/13   Larey Dresser, MD  spironolactone (ALDACTONE) 25 MG tablet Take 1 tablet (25 mg total) by mouth daily. 12/04/13   Larey Dresser, MD   BP 158/108 mmHg  Pulse 85  Temp(Src) 97.3 F (36.3 C) (Oral)  Resp 18  Ht 6' (1.829 m)  Wt 163 lb (73.936 kg)  BMI 22.10 kg/m2  SpO2 100% Physical Exam  Constitutional: He is oriented to person, place, and time. He appears well-developed and well-nourished. No distress.  HENT:  Head: Normocephalic and atraumatic.  Eyes: No scleral icterus.  Neck: Neck supple.  Cardiovascular: Normal rate.   Murmur heard. Pulmonary/Chest: Effort normal. No respiratory distress. He has no wheezes. He has no rales. He exhibits no tenderness.  Abdominal: Soft. Normal  appearance and bowel sounds are normal. There is tenderness in the left lower quadrant. There is no rebound, no guarding and no CVA tenderness.  Musculoskeletal: Normal range of motion.  Neurological: He is alert and oriented to person, place, and time.  Skin: Skin is warm and dry.  Psychiatric: He has a normal mood and affect. His behavior is normal.  Nursing note and vitals reviewed.   ED Course  Procedures (including critical care time) Labs Review Labs Reviewed  CBC - Abnormal; Notable for the following:    Hemoglobin 12.8 (*)    HCT 38.1 (*)    All other components within normal limits  BASIC METABOLIC PANEL - Abnormal; Notable for the following:    Potassium 3.6 (*)    Creatinine, Ser 1.41 (*)    GFR calc non Af Amer 61 (*)    GFR calc Af Amer 70 (*)  All other components within normal limits  URINALYSIS, ROUTINE W REFLEX MICROSCOPIC - Abnormal; Notable for the following:    Protein, ur 100 (*)    All other components within normal limits  URINE MICROSCOPIC-ADD ON  Randolm Idol, ED  Randolm Idol, ED    Imaging Review Dg Chest 2 View  02/23/2014   CLINICAL DATA:  43 year old male chest pain shortness of breath and cough for 1 week. Initial encounter.  EXAM: CHEST  2 VIEW  COMPARISON:  09/11/2013 and earlier.  FINDINGS: Stable cardiomegaly and mediastinal contours. Stable lung volumes. Visualized tracheal air column is within normal limits. No pneumothorax, pulmonary edema, pleural effusion or confluent pulmonary opacity. Mildly increased interstitial markings diffusely. No osseous abnormality identified.  IMPRESSION: Stable cardiomegaly. Mildly increased interstitial markings could reflect viral or atypical respiratory infection. No focal pneumonia.   Electronically Signed   By: Lars Pinks M.D.   On: 02/23/2014 07:07     EKG Interpretation   Date/Time:  Friday February 23 2014 06:14:14 EST Ventricular Rate:  91 PR Interval:  160 QRS Duration: 100 QT Interval:   398 QTC Calculation: 489 R Axis:   -19 Text Interpretation:  Normal sinus rhythm Biatrial enlargement Left  ventricular hypertrophy ST \\T \ Marked T wave abnormality, consider lateral  ischemia Prolonged QT Abnormal ECG When compared with ECG of 09/12/2013, No  significant change was found Confirmed by [facility]  MD, DAVID ([id number]) on  02/23/2014 6:25:26 AM      MDM   Final diagnoses:  Chest pain, unspecified chest pain type  Right flank pain  Viral illness   Patient with atypical chest pain for one week with associated URI symptoms. And radiation to right arm. Patient also complains of left-sided flank pain worsened with movement, likely musculoskeletal no CVA tenderness or hematuria. MR shows last Echo 02/21/2013 EF 45-50 %. Stress test 06/18/2011 no ischemic changes. EKG without concerning abnormalities, chest x-ray shows viral infection. Initial troponin negative.UA negative for infection or blood, BMP without concerning abnormalities. Cr and 1.41, stable from previous. Dr. Sabra Heck also evaluate patient during this counter. Second troponin negative. Discussed lab results, imaging results, and treatment plan with the patient. Advises outpatient follow-up with cardiologist, follow-up with orthopedist given likely musculoskeletal Return precautions given. Reports understanding and no other concerns at this time.  Patient is stable for discharge at this time.  Meds given in ED:  Medications  aspirin chewable tablet 324 mg (324 mg Oral Given 02/23/14 0731)  morphine 4 MG/ML injection 4 mg (4 mg Intravenous Given 02/23/14 0734)  ibuprofen (ADVIL,MOTRIN) tablet 800 mg (800 mg Oral Given 02/23/14 1027)  oxyCODONE-acetaminophen (PERCOCET/ROXICET) 5-325 MG per tablet 1 tablet (1 tablet Oral Given 02/23/14 1011)    Discharge Medication List as of 02/23/2014 10:26 AM    START taking these medications   Details  HYDROcodone-acetaminophen (NORCO/VICODIN) 5-325 MG per tablet Take 1 tablet by mouth  every 6 (six) hours as needed for moderate pain or severe pain., Starting 02/23/2014, Until Discontinued, Print        Harvie Heck, PA-C 02/23/14 1527  Johnna Acosta, MD 02/24/14 Bosie Helper

## 2014-03-06 ENCOUNTER — Encounter: Payer: Self-pay | Admitting: Cardiology

## 2014-03-26 IMAGING — US US RENAL
1 series · 14 of 25 positions shown · non-contrast
Comparison: CT abdomen pelvis of 03/23/2010

CLINICAL DATA: Chronic kidney disease

RENAL/URINARY TRACT ULTRASOUND COMPLETE

[Series 1: us renal · 0.24mm/px · 14 of 40 slices shown]
[im 1/40]
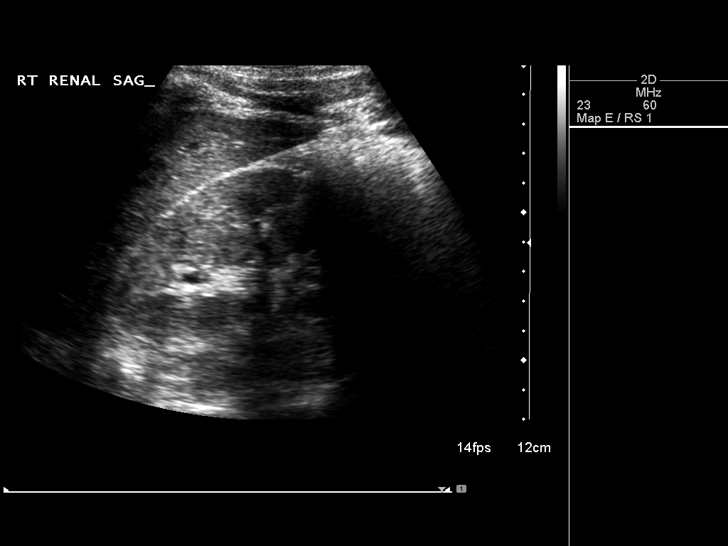
[im 4/40]
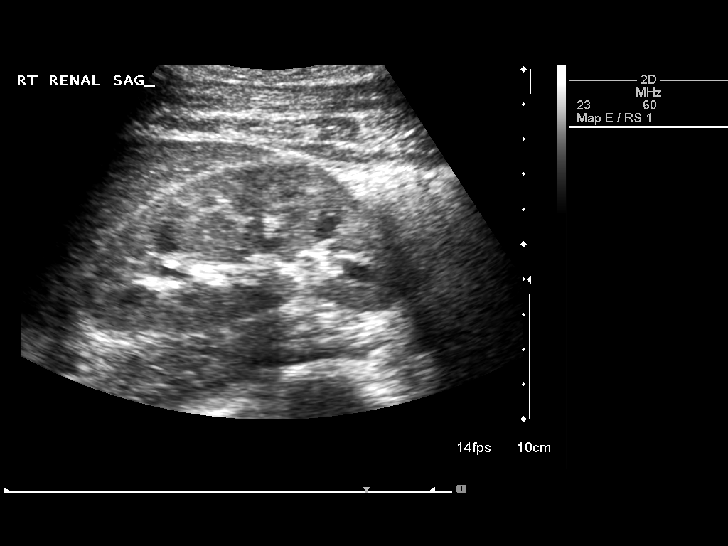
[im 7/40]
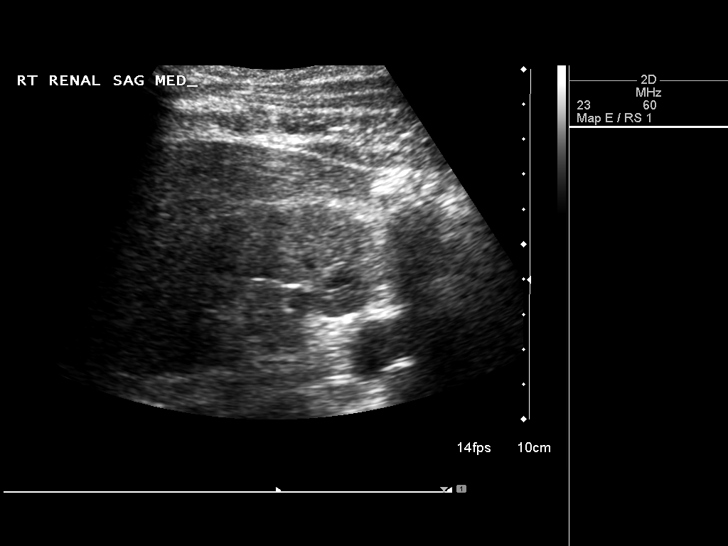
[im 10/40]
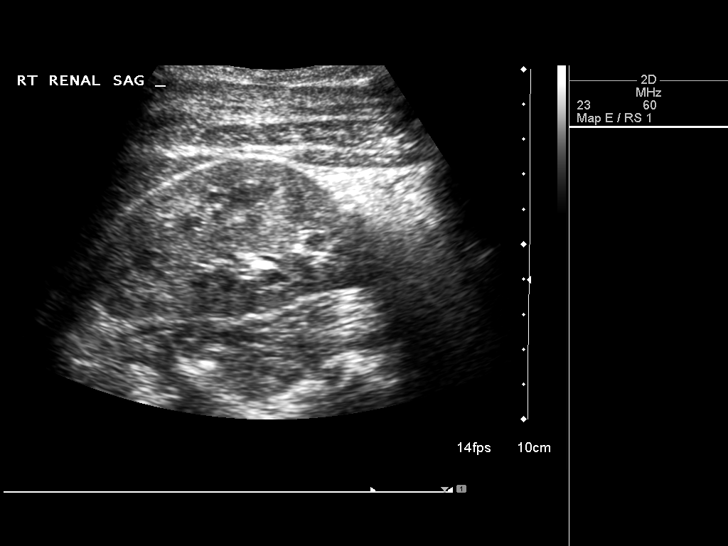
[im 14/40]
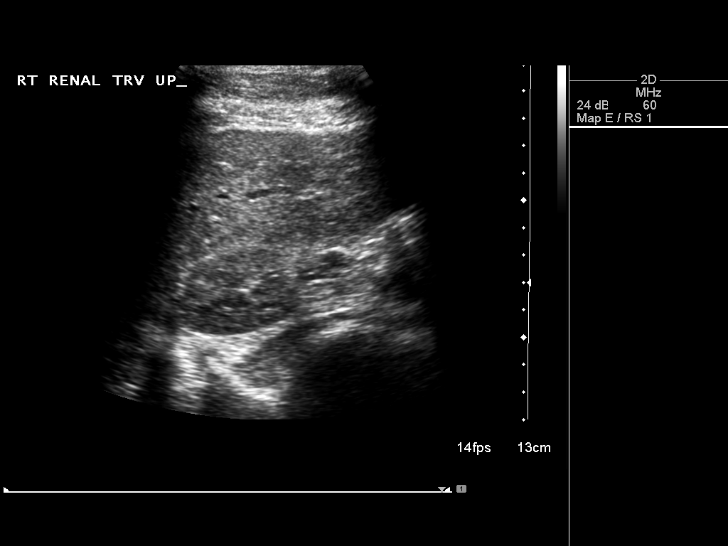
[im 15/40]
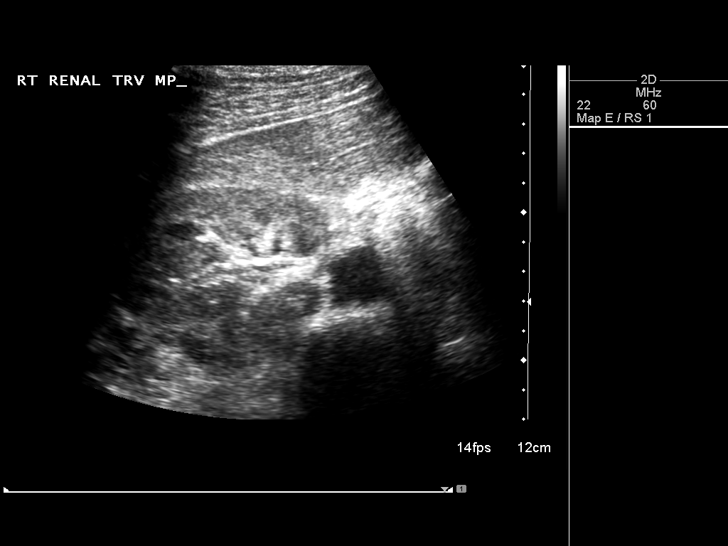
[im 18/40]
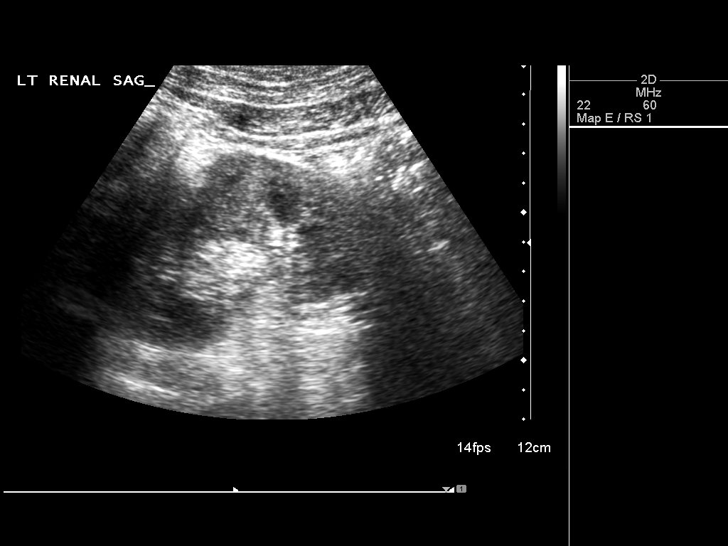
[im 22/40]
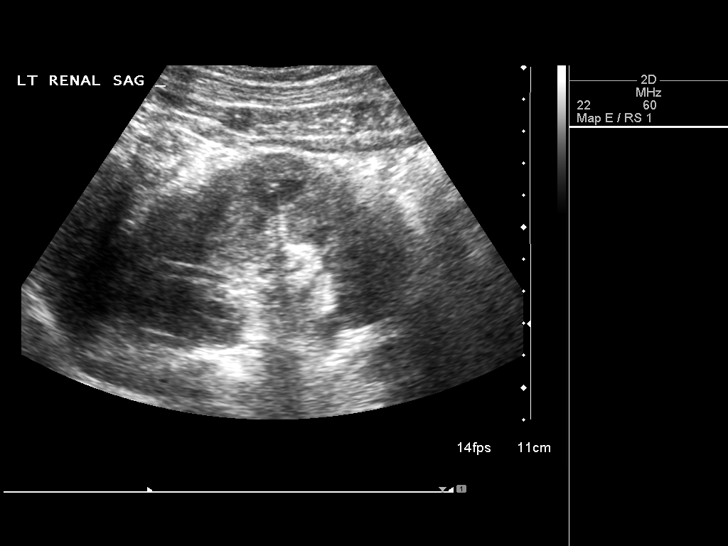
[im 25/40]
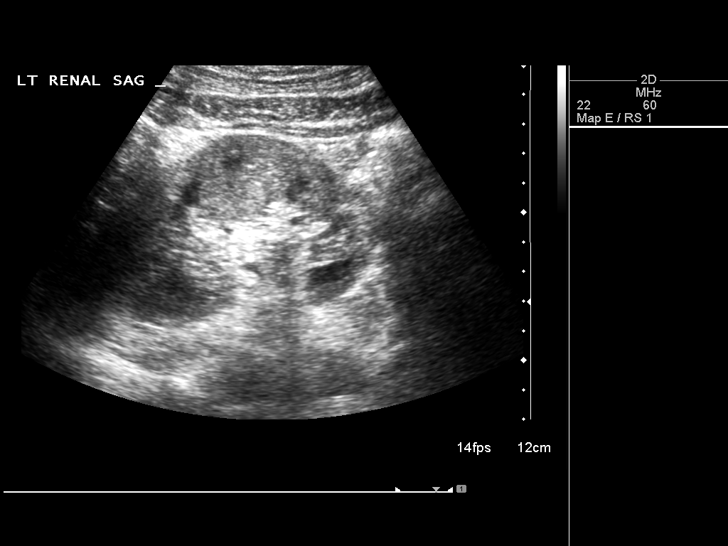
[im 27/40]
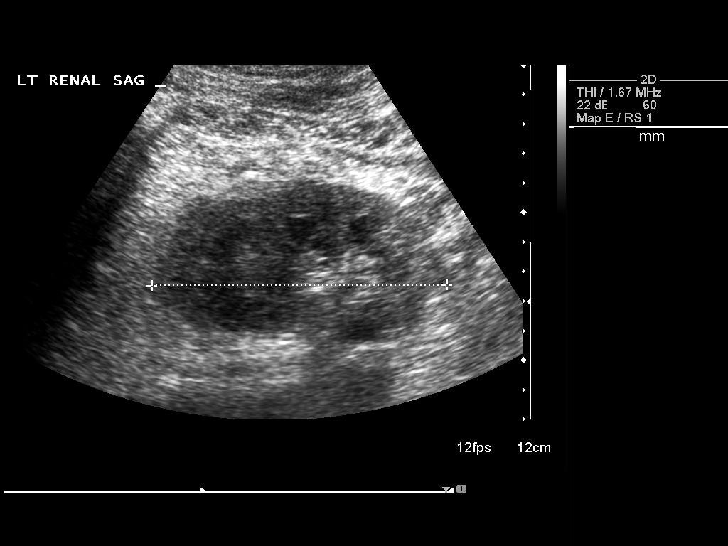
[im 30/40]
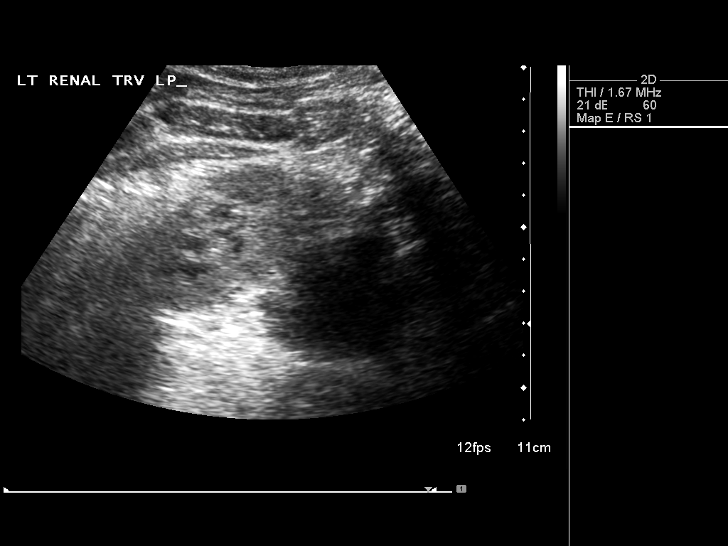
[im 33/40]
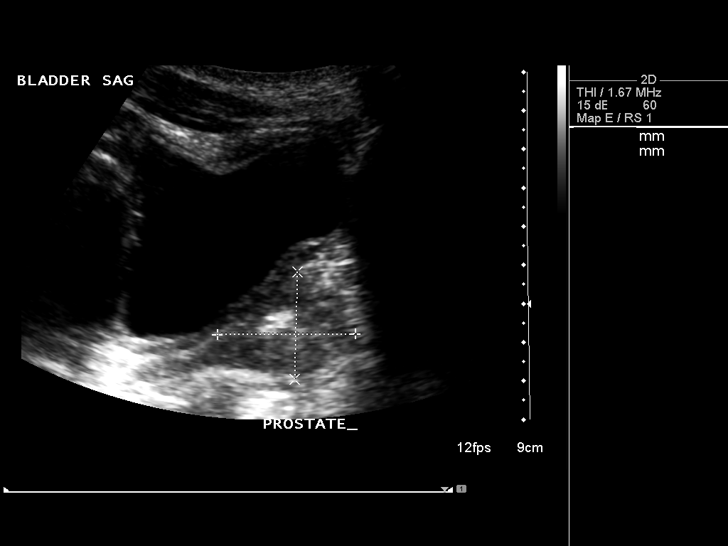
[im 36/40]
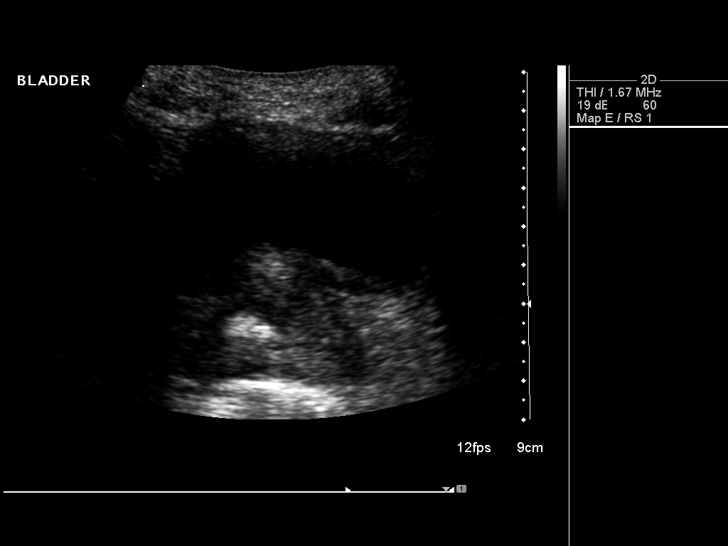
[im 40/40]
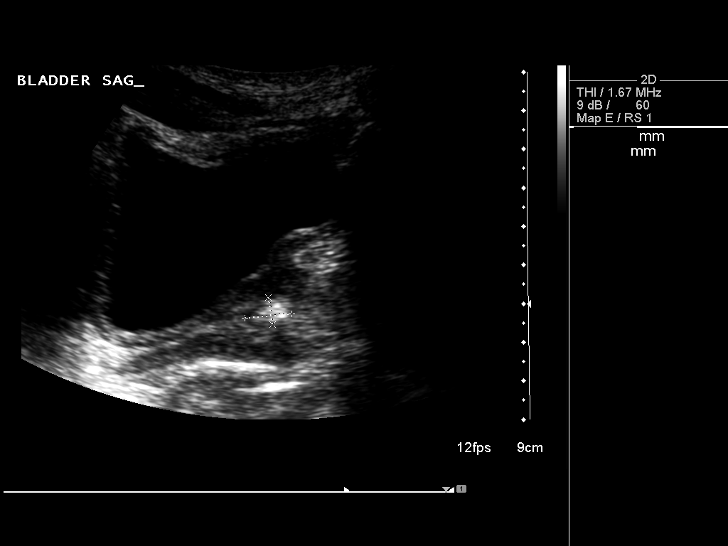

[14 of 25 positions shown; findings below may reference images not displayed]

FINDINGS: Right Kidney:  No hydronephrosis is noted.  The right kidney
measures 9.8 cm sagittally.  The parenchyma of the right kidney is
echogenic.

Left Kidney:  No hydronephrosis is seen and the left kidney
measures 10.0 cm.  The echogenicity of the renal parenchyma also is
echogenic.

Bladder:  The urinary bladder is not well distended.

The prostate measures 3.6x 2.8 x 4.7 cm with an echogenic focus
present centrally.
IMPRESSION: 1.  No hydronephrosis.
2.  Echogenic renal parenchyma consistent with chronic renal
medical disease.
3.  Calcification within the mid prostate.

## 2014-03-27 ENCOUNTER — Observation Stay (HOSPITAL_COMMUNITY)
Admission: EM | Admit: 2014-03-27 | Discharge: 2014-03-29 | Discharge status: Against Medical Advice | Payer: Medicare Other | Attending: Oncology | Admitting: Oncology

## 2014-03-27 ENCOUNTER — Encounter (HOSPITAL_COMMUNITY): Payer: Self-pay | Admitting: *Deleted

## 2014-03-27 ENCOUNTER — Observation Stay (HOSPITAL_COMMUNITY): Payer: Medicare Other

## 2014-03-27 ENCOUNTER — Emergency Department (HOSPITAL_COMMUNITY): Payer: Medicare Other

## 2014-03-27 DIAGNOSIS — I161 Hypertensive emergency: Secondary | ICD-10-CM

## 2014-03-27 DIAGNOSIS — I080 Rheumatic disorders of both mitral and aortic valves: Secondary | ICD-10-CM | POA: Diagnosis not present

## 2014-03-27 DIAGNOSIS — I10 Essential (primary) hypertension: Secondary | ICD-10-CM | POA: Diagnosis present

## 2014-03-27 DIAGNOSIS — I712 Thoracic aortic aneurysm, without rupture: Secondary | ICD-10-CM | POA: Diagnosis not present

## 2014-03-27 DIAGNOSIS — N183 Chronic kidney disease, stage 3 unspecified: Secondary | ICD-10-CM | POA: Diagnosis present

## 2014-03-27 DIAGNOSIS — I351 Nonrheumatic aortic (valve) insufficiency: Secondary | ICD-10-CM | POA: Diagnosis present

## 2014-03-27 DIAGNOSIS — E785 Hyperlipidemia, unspecified: Secondary | ICD-10-CM | POA: Diagnosis present

## 2014-03-27 DIAGNOSIS — I119 Hypertensive heart disease without heart failure: Secondary | ICD-10-CM | POA: Diagnosis present

## 2014-03-27 DIAGNOSIS — R0602 Shortness of breath: Secondary | ICD-10-CM | POA: Diagnosis not present

## 2014-03-27 DIAGNOSIS — I340 Nonrheumatic mitral (valve) insufficiency: Secondary | ICD-10-CM | POA: Diagnosis present

## 2014-03-27 DIAGNOSIS — R0600 Dyspnea, unspecified: Secondary | ICD-10-CM | POA: Diagnosis not present

## 2014-03-27 DIAGNOSIS — N179 Acute kidney failure, unspecified: Secondary | ICD-10-CM | POA: Diagnosis not present

## 2014-03-27 DIAGNOSIS — I129 Hypertensive chronic kidney disease with stage 1 through stage 4 chronic kidney disease, or unspecified chronic kidney disease: Secondary | ICD-10-CM | POA: Insufficient documentation

## 2014-03-27 DIAGNOSIS — I43 Cardiomyopathy in diseases classified elsewhere: Secondary | ICD-10-CM | POA: Diagnosis present

## 2014-03-27 DIAGNOSIS — Z9119 Patient's noncompliance with other medical treatment and regimen: Secondary | ICD-10-CM | POA: Diagnosis not present

## 2014-03-27 DIAGNOSIS — F17210 Nicotine dependence, cigarettes, uncomplicated: Secondary | ICD-10-CM | POA: Diagnosis not present

## 2014-03-27 DIAGNOSIS — I429 Cardiomyopathy, unspecified: Secondary | ICD-10-CM | POA: Diagnosis not present

## 2014-03-27 DIAGNOSIS — I5022 Chronic systolic (congestive) heart failure: Secondary | ICD-10-CM | POA: Insufficient documentation

## 2014-03-27 DIAGNOSIS — Z720 Tobacco use: Secondary | ICD-10-CM | POA: Diagnosis present

## 2014-03-27 DIAGNOSIS — F1290 Cannabis use, unspecified, uncomplicated: Secondary | ICD-10-CM | POA: Diagnosis present

## 2014-03-27 DIAGNOSIS — R05 Cough: Secondary | ICD-10-CM | POA: Diagnosis not present

## 2014-03-27 DIAGNOSIS — G44029 Chronic cluster headache, not intractable: Secondary | ICD-10-CM

## 2014-03-27 DIAGNOSIS — R9431 Abnormal electrocardiogram [ECG] [EKG]: Secondary | ICD-10-CM | POA: Diagnosis present

## 2014-03-27 DIAGNOSIS — R791 Abnormal coagulation profile: Secondary | ICD-10-CM | POA: Diagnosis not present

## 2014-03-27 DIAGNOSIS — I7121 Aneurysm of the ascending aorta, without rupture: Secondary | ICD-10-CM | POA: Diagnosis present

## 2014-03-27 LAB — CBC
HCT: 40.2 % (ref 39.0–52.0)
Hemoglobin: 13.6 g/dL (ref 13.0–17.0)
MCH: 30.7 pg (ref 26.0–34.0)
MCHC: 33.8 g/dL (ref 30.0–36.0)
MCV: 90.7 fL (ref 78.0–100.0)
PLATELETS: 152 10*3/uL (ref 150–400)
RBC: 4.43 MIL/uL (ref 4.22–5.81)
RDW: 14.8 % (ref 11.5–15.5)
WBC: 5 10*3/uL (ref 4.0–10.5)

## 2014-03-27 LAB — URINALYSIS, ROUTINE W REFLEX MICROSCOPIC
Bilirubin Urine: NEGATIVE
Glucose, UA: NEGATIVE mg/dL
HGB URINE DIPSTICK: NEGATIVE
KETONES UR: NEGATIVE mg/dL
LEUKOCYTES UA: NEGATIVE
Nitrite: NEGATIVE
PROTEIN: NEGATIVE mg/dL
Specific Gravity, Urine: 1.027 (ref 1.005–1.030)
Urobilinogen, UA: 0.2 mg/dL (ref 0.0–1.0)
pH: 6 (ref 5.0–8.0)

## 2014-03-27 LAB — BASIC METABOLIC PANEL
ANION GAP: 7 (ref 5–15)
BUN: 13 mg/dL (ref 6–23)
CHLORIDE: 108 meq/L (ref 96–112)
CO2: 24 mmol/L (ref 19–32)
Calcium: 9.2 mg/dL (ref 8.4–10.5)
Creatinine, Ser: 1.7 mg/dL — ABNORMAL HIGH (ref 0.50–1.35)
GFR calc non Af Amer: 48 mL/min — ABNORMAL LOW (ref >90)
GFR, EST AFRICAN AMERICAN: 56 mL/min — AB (ref >90)
Glucose, Bld: 140 mg/dL — ABNORMAL HIGH (ref 70–99)
POTASSIUM: 4 mmol/L (ref 3.5–5.1)
Sodium: 139 mmol/L (ref 135–145)

## 2014-03-27 LAB — RAPID URINE DRUG SCREEN, HOSP PERFORMED
Amphetamines: NOT DETECTED
Barbiturates: NOT DETECTED
Benzodiazepines: NOT DETECTED
Cocaine: NOT DETECTED
Opiates: NOT DETECTED
Tetrahydrocannabinol: POSITIVE — AB

## 2014-03-27 LAB — BRAIN NATRIURETIC PEPTIDE: B NATRIURETIC PEPTIDE 5: 550.7 pg/mL — AB (ref 0.0–100.0)

## 2014-03-27 LAB — PROTIME-INR
INR: 1.05 (ref 0.00–1.49)
Prothrombin Time: 13.8 seconds (ref 11.6–15.2)

## 2014-03-27 LAB — PROTEIN / CREATININE RATIO, URINE
Creatinine, Urine: 116.27 mg/dL
Protein Creatinine Ratio: 0.15 (ref 0.00–0.15)
Total Protein, Urine: 18 mg/dL

## 2014-03-27 LAB — D-DIMER, QUANTITATIVE: D-Dimer, Quant: 0.56 ug/mL-FEU — ABNORMAL HIGH (ref 0.00–0.48)

## 2014-03-27 LAB — SODIUM, URINE, RANDOM: SODIUM UR: 88 mmol/L

## 2014-03-27 LAB — I-STAT TROPONIN, ED: Troponin i, poc: 0.01 ng/mL (ref 0.00–0.08)

## 2014-03-27 LAB — APTT: aPTT: 34 seconds (ref 24–37)

## 2014-03-27 MED ORDER — IBUPROFEN 800 MG PO TABS
800.0000 mg | ORAL_TABLET | Freq: Once | ORAL | Status: AC
  Administered 2014-03-27: 800 mg via ORAL
  Filled 2014-03-27: qty 1

## 2014-03-27 MED ORDER — HEPARIN SODIUM (PORCINE) 5000 UNIT/ML IJ SOLN
5000.0000 [IU] | Freq: Three times a day (TID) | INTRAMUSCULAR | Status: DC
  Administered 2014-03-27 – 2014-03-28 (×4): 5000 [IU] via SUBCUTANEOUS
  Filled 2014-03-27 (×6): qty 1

## 2014-03-27 MED ORDER — CLONIDINE HCL 0.1 MG PO TABS
0.1000 mg | ORAL_TABLET | Freq: Once | ORAL | Status: AC
  Administered 2014-03-27: 0.1 mg via ORAL
  Filled 2014-03-27: qty 1

## 2014-03-27 MED ORDER — HYDRALAZINE HCL 20 MG/ML IJ SOLN
10.0000 mg | Freq: Once | INTRAMUSCULAR | Status: AC
  Administered 2014-03-27: 10 mg via INTRAVENOUS
  Filled 2014-03-27: qty 1

## 2014-03-27 MED ORDER — ACETAMINOPHEN 325 MG PO TABS
650.0000 mg | ORAL_TABLET | Freq: Four times a day (QID) | ORAL | Status: DC | PRN
  Administered 2014-03-27: 650 mg via ORAL
  Filled 2014-03-27: qty 2

## 2014-03-27 MED ORDER — FUROSEMIDE 10 MG/ML IJ SOLN
40.0000 mg | Freq: Once | INTRAMUSCULAR | Status: AC
  Administered 2014-03-27: 40 mg via INTRAVENOUS
  Filled 2014-03-27: qty 4

## 2014-03-27 MED ORDER — METOPROLOL TARTRATE 1 MG/ML IV SOLN: 5.0000 mg | Freq: Four times a day (QID) | INTRAVENOUS | Status: DC | PRN

## 2014-03-27 MED ORDER — TRAMADOL HCL 50 MG PO TABS
50.0000 mg | ORAL_TABLET | Freq: Once | ORAL | Status: AC
  Administered 2014-03-28: 50 mg via ORAL
  Filled 2014-03-27: qty 1

## 2014-03-27 MED ORDER — ACETAMINOPHEN 650 MG RE SUPP: 650.0000 mg | Freq: Four times a day (QID) | RECTAL | Status: DC | PRN

## 2014-03-27 MED ORDER — ALBUTEROL SULFATE (2.5 MG/3ML) 0.083% IN NEBU
5.0000 mg | INHALATION_SOLUTION | Freq: Once | RESPIRATORY_TRACT | Status: AC
  Administered 2014-03-27: 5 mg via RESPIRATORY_TRACT
  Filled 2014-03-27: qty 6

## 2014-03-27 MED ORDER — ALBUTEROL SULFATE (2.5 MG/3ML) 0.083% IN NEBU
5.0000 mg | INHALATION_SOLUTION | RESPIRATORY_TRACT | Status: DC | PRN
  Administered 2014-03-27: 5 mg via RESPIRATORY_TRACT

## 2014-03-27 MED ORDER — HYDRALAZINE HCL 50 MG PO TABS
75.0000 mg | ORAL_TABLET | Freq: Three times a day (TID) | ORAL | Status: DC
  Administered 2014-03-27 – 2014-03-28 (×4): 75 mg via ORAL
  Filled 2014-03-27 (×8): qty 1

## 2014-03-27 MED ORDER — METOPROLOL TARTRATE 1 MG/ML IV SOLN
5.0000 mg | Freq: Once | INTRAVENOUS | Status: AC
  Administered 2014-03-27: 5 mg via INTRAVENOUS
  Filled 2014-03-27: qty 5

## 2014-03-27 MED ORDER — IOHEXOL 350 MG/ML SOLN
80.0000 mL | Freq: Once | INTRAVENOUS | Status: AC | PRN
  Administered 2014-03-27: 80 mL via INTRAVENOUS

## 2014-03-27 MED ORDER — CARVEDILOL 25 MG PO TABS
25.0000 mg | ORAL_TABLET | Freq: Two times a day (BID) | ORAL | Status: DC
  Administered 2014-03-27 – 2014-03-28 (×3): 25 mg via ORAL
  Filled 2014-03-27 (×4): qty 1

## 2014-03-27 MED ORDER — AMLODIPINE BESYLATE 10 MG PO TABS
10.0000 mg | ORAL_TABLET | Freq: Every day | ORAL | Status: DC
  Administered 2014-03-28: 10 mg via ORAL
  Filled 2014-03-27 (×2): qty 1

## 2014-03-27 NOTE — ED Notes (Signed)
Attempted report x1. 

## 2014-03-27 NOTE — ED Provider Notes (Signed)
CSN: [id number]     Arrival date & time 03/27/14  1325 History   First MD Initiated Contact with Patient 03/27/14 1359     Chief Complaint  Patient presents with  . Shortness of Breath     (Consider location/radiation/quality/duration/timing/severity/associated sxs/prior Treatment) HPI.... Patient reports increasing dyspnea with exertion and while lying down.  No chest pain, nausea, diaphoresis. Past medical history: severe hypertension, congestive heart failure, cardiomyopathy, lipids, chronic kidney disease.  A shunt is allegedly taking his blood pressure medications. Severity is moderate.  Past Medical History  Diagnosis Date  . Hypertension     a. Hx of HTN urgency secondary to noncompliance. b. urinary metanephrine and catecholeamine levels normal 2013.  [name] Anxiety   . GERD (gastroesophageal reflux disease)   . Acute renal failure     June 2012 felt secondary to Toradol  . Chronic renal failure   . CHF (congestive heart failure)     a. 05/2011: Adm with pulm edema/HTN urgency, EF 35-40% with diffuse hypokinesis and moderate to severe mitral regurgitation. Cardiomyopathy likely due to uncontrolled HTN and ETOH abuse - cath deferred due to renal insufficiency (felt due to uncontrolled HTN). bJodie Echevaria MV 06/2011: EF 37% and no ischemia or infarction. c. EF 45-50% by echo 01/2012.  . Cardiomyopathy   . HYPERLIPIDEMIA   . INGUINAL HERNIA   . ETOH abuse     a. Reported to have quit 05/2011.  . Valvular heart disease     a. Echo 05/2011: moderate to severe eccentric MR and mild to moderate AI with prolapsing left coronary cusp. b. Echo 01/2012: mild-mod AI, mild dilitation of aortic root, mild MR.  . CKD (chronic kidney disease)     a. 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  . Tobacco abuse   . Pneumonia ~ 2013  . Headache(784.0)     "q other day" (08/08/2013)  . Migraine     "twice/month" (08/08/2013)  . Chronic sinusitis   . History of medication noncompliance   . Chronic abdominal pain   . DDD  (degenerative disc disease), lumbar    Past Surgical History  Procedure Laterality Date  . Ankle surgery      Fractures bilaterally  . Inguinal hernia repair Right ~ 1996  . Foot fracture surgery Bilateral 2004-2010    "got pins in both of them"   Family History  Problem Relation Age of Onset  . Hypertension    . Colon cancer Paternal Uncle    History  Substance Use Topics  . Smoking status: Current Some Day Smoker -- 0.25 packs/day for 23 years    Types: Cigarettes  . Smokeless tobacco: Former Systems developer    Types: Chew  . Alcohol Use: Yes     Comment: occassional only, last new years    Review of Systems  All other systems reviewed and are negative.     Allergies  Imdur  Home Medications   Prior to Admission medications   Medication Sig Start Date End Date Taking? Authorizing Provider  amLODipine (NORVASC) 10 MG tablet Take 1 tablet (10 mg total) by mouth daily. 12/04/13  Yes Larey Dresser, MD  carvedilol (COREG) 25 MG tablet Take 1 tablet (25 mg total) by mouth 2 (two) times daily. 12/04/13  Yes Larey Dresser, MD  cyclobenzaprine (FLEXERIL) 5 MG tablet Take 1 tablet (5 mg total) by mouth 3 (three) times daily as needed for muscle spasms (please give 1st dose now). 05/06/13  Yes Waldemar Dickens, MD  furosemide (LASIX) 20  MG tablet Take 1 tablet (20 mg total) by mouth daily as needed for fluid or edema (shortness of breath). 09/12/13  Yes Ejiroghene E Emokpae, MD  hydrALAZINE (APRESOLINE) 50 MG tablet 1 and 1/2 tablets (75mg ) three times a day Patient taking differently: Take 75 mg by mouth See admin instructions. 1 and 1/2 tablets (75mg ) three times a day 12/04/13  Yes Larey Dresser, MD  HYDROcodone-acetaminophen (NORCO/VICODIN) 5-325 MG per tablet Take 1 tablet by mouth every 6 (six) hours as needed for moderate pain or severe pain. 02/23/14  Yes Harvie Heck, PA-C  lisinopril (PRINIVIL,ZESTRIL) 20 MG tablet Take 1 tablet (20 mg total) by mouth daily. 12/04/13  Yes Larey Dresser, MD  spironolactone (ALDACTONE) 25 MG tablet Take 1 tablet (25 mg total) by mouth daily. 12/04/13  Yes Larey Dresser, MD  levofloxacin (LEVAQUIN) 500 MG tablet Take 1 tablet (500 mg total) by mouth daily. Patient not taking: Reported on 03/27/2014 09/12/13   Ejiroghene E Emokpae, MD   BP 154/105 mmHg  Pulse 68  Temp(Src) 98.6 F (37 C) (Oral)  Resp 20  Ht 6' (1.829 m)  Wt 157 lb 4 oz (71.328 kg)  BMI 21.32 kg/m2  SpO2 97% Physical Exam  Constitutional: He is oriented to person, place, and time. He appears well-developed and well-nourished.  Hypertensive  HENT:  Head: Normocephalic and atraumatic.  Eyes: Conjunctivae and EOM are normal. Pupils are equal, round, and reactive to light.  Neck: Normal range of motion. Neck supple.  Cardiovascular: Normal rate and regular rhythm.   Pulmonary/Chest: Effort normal and breath sounds normal.  Abdominal: Soft. Bowel sounds are normal.  Musculoskeletal: Normal range of motion.  Neurological: He is alert and oriented to person, place, and time.  Skin: Skin is warm and dry.  Psychiatric: He has a normal mood and affect. His behavior is normal.  Nursing note and vitals reviewed.   ED Course  Procedures (including critical care time) Labs Review Labs Reviewed  BASIC METABOLIC PANEL - Abnormal; Notable for the following:    Glucose, Bld 140 (*)    Creatinine, Ser 1.70 (*)    GFR calc non Af Amer 48 (*)    GFR calc Af Amer 56 (*)    All other components within normal limits  BRAIN NATRIURETIC PEPTIDE - Abnormal; Notable for the following:    B Natriuretic Peptide 550.7 (*)    All other components within normal limits  CBC  D-DIMER, QUANTITATIVE  I-STAT TROPOININ, ED    Imaging Review Dg Chest 2 View  03/27/2014   CLINICAL DATA:  Shortness of breath, cough 02/23/2014  EXAM: CHEST  2 VIEW  COMPARISON:  None.  FINDINGS: There is no focal parenchymal opacity, pleural effusion, or pneumothorax. The heart size is top normal.  The  osseous structures are unremarkable.  IMPRESSION: No active cardiopulmonary disease.   Electronically Signed   By: Kathreen Devoid   On: 03/27/2014 14:57     EKG Interpretation   Date/Time:  Tuesday March 27 2014 13:28:56 EST Ventricular Rate:  99 PR Interval:  154 QRS Duration: 96 QT Interval:  362 QTC Calculation: 464 R Axis:   -2 Text Interpretation:  Normal sinus rhythm Biatrial enlargement Left  ventricular hypertrophy ST \\T \ T wave abnormality, consider lateral  ischemia Prolonged QT Abnormal ECG Confirmed by Lacinda Axon  MD, Skilynn Durney ([id number]) on  03/27/2014 2:11:48 PM      MDM   Final diagnoses:  Dyspnea  Essential hypertension    Patient  does not have a primary care relationship. His blood pressure is severely elevated and he is dyspneic with exertion. Additionally he has multiple cardiac risk factors. Patient given IV Lasix and clonidine 0.1 mg in the emergency department. Discussed with internal medicine residency program. Admit to observation.    Nat Christen, MD 03/27/14 [phone]

## 2014-03-27 NOTE — ED Notes (Signed)
Attempted report x 2 

## 2014-03-27 NOTE — Progress Notes (Signed)
Pt. Arrived to the floor via stretcher from ED. Pt. Alert and oriented. No distress noted. BP elevated. On call MD, admitting, at pt. Bedside.

## 2014-03-27 NOTE — ED Notes (Signed)
Attempted report, unable to take pt at this time due to high BP.  Spoke to MD to request medication.

## 2014-03-27 NOTE — ED Notes (Signed)
Patient was given a [place] sandwich bag with a sprite.

## 2014-03-27 NOTE — H&P (Signed)
Date: 03/27/2014               Patient Name:  [name] MRN: [id number]  DOB: 27-Oct-1970 Age / Sex: 44 y.o., male   PCP: [name], MD         Medical Service: Internal Medicine Teaching Service         Attending Physician: Dr. Annia Belt, MD    First Contact: Dr. Luan Moore Pager: [phone]  Second Contact: Dr. Natasha Bence Pager: [phone]       After Hours (After 5p/  First Contact Pager: [phone]  weekends / holidays): Second Contact Pager: [phone]   Chief Complaint: Dyspnea  History of Present Illness: Mr. [name] is a 44 year old man with CHF, cardiomyopathy 2/2 HTN and EtOH  use, HTN, CKD who presents with shortness of breath. He was in his usual state of health until about three days ago when he had acute onset of shortness of breath. He says it is unrelated to exertion. He normally has three pillow orthopnea but it is since increased to 4-5. Over this same time period he feels lightheaded when standing and developed a headache today. [name] also reports a 20 lb weight loss in the past 3 months and says he has had night sweats all of his life which is unchanged. He denies any chest pain, palpitations, cough, wheezing, LE swelling warmth or pain, fevers, chills, abdominal pain, nausea, emesis, diarrhea, constipation, vision changes, weakness, numbness, paresthesia. He smokes 3-4 cigarettes a day for the past 30 years. He has not drunk alcohol regularly in a year with his last drinks around Christmas. He uses marijuana recreationally but denies any other drug use.  Mr. [name] was noted to have a BP of 192/127 on presentation to the ED. He says he has been compliant with his BP medications (amlodipine 10 mg daily, carvedilol 25 mg bid, hydralazine 75 mg tid, lisinopril 20 mg daily, spironolactone 25 mg daily, lasix 20 mg dailyprn) but could not remember them from memory and thought he was supposed to take spironolactone only as needed. He has documented issues with  non-compliance per EMR. He says his SBP is normally in the 140s at home.  In the ED, he received lasix 40 mg iv once, hydralazine 10 mg iv once, clonidine 0.1 mg po once, and ibuprofen 800 mg po once. He said his SOB and orthopnea was improved during his exam (after receiving these medications) but he still had a headache and most recent BP 197/133.  Meds: Current Facility-Administered Medications  Medication Dose Route Frequency Provider Last Rate Last Dose  . acetaminophen (TYLENOL) tablet 650 mg  650 mg Oral Q6H PRN Juluis Mire, MD   650 mg at 03/27/14 2131   Or  . acetaminophen (TYLENOL) suppository 650 mg  650 mg Rectal Q6H PRN Marjan Rabbani, MD      . albuterol (PROVENTIL) (2.5 MG/3ML) 0.083% nebulizer solution 5 mg  5 mg Nebulization Q2H PRN Juluis Mire, MD   5 mg at 03/27/14 2213  . [START ON 03/28/2014] amLODipine (NORVASC) tablet 10 mg  10 mg Oral Daily Marjan Rabbani, MD      . carvedilol (COREG) tablet 25 mg  25 mg Oral BID WC Marjan Rabbani, MD      . heparin injection 5,000 Units  5,000 Units Subcutaneous 3 times per day Marjan Rabbani, MD      . hydrALAZINE (APRESOLINE) tablet 75 mg  75 mg Oral TID Juluis Mire, MD  Allergies: Allergies as of 03/27/2014 - Review Complete 03/27/2014  Allergen Reaction Noted  . Imdur [isosorbide] Other (See Comments) 09/19/2012   Past Medical History  Diagnosis Date  . Hypertension     a. Hx of HTN urgency secondary to noncompliance. b. urinary metanephrine and catecholeamine levels normal 2013.  [name] Anxiety   . GERD (gastroesophageal reflux disease)   . Acute renal failure     June 2012 felt secondary to Toradol  . Chronic renal failure   . CHF (congestive heart failure)     a. 05/2011: Adm with pulm edema/HTN urgency, EF 35-40% with diffuse hypokinesis and moderate to severe mitral regurgitation. Cardiomyopathy likely due to uncontrolled HTN and ETOH abuse - cath deferred due to renal insufficiency (felt due to uncontrolled  HTN). bJodie Echevaria MV 06/2011: EF 37% and no ischemia or infarction. c. EF 45-50% by echo 01/2012.  . Cardiomyopathy   . HYPERLIPIDEMIA   . INGUINAL HERNIA   . ETOH abuse     a. Reported to have quit 05/2011.  . Valvular heart disease     a. Echo 05/2011: moderate to severe eccentric MR and mild to moderate AI with prolapsing left coronary cusp. b. Echo 01/2012: mild-mod AI, mild dilitation of aortic root, mild MR.  . CKD (chronic kidney disease)     a. 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  . Tobacco abuse   . Pneumonia ~ 2013  . Headache(784.0)     "q other day" (08/08/2013)  . Migraine     "twice/month" (08/08/2013)  . Chronic sinusitis   . History of medication noncompliance   . Chronic abdominal pain   . DDD (degenerative disc disease), lumbar    Past Surgical History  Procedure Laterality Date  . Ankle surgery      Fractures bilaterally  . Inguinal hernia repair Right ~ 1996  . Foot fracture surgery Bilateral 2004-2010    "got pins in both of them"   Family History  Problem Relation Age of Onset  . Hypertension    . Colon cancer Paternal Uncle    History   Social History  . Marital Status: Married    Spouse Name: N/A    Number of Children: 71  . Years of Education: N/A   Occupational History  . Disabled, part-time Programmer, systems     Disability   Social History Main Topics  . Smoking status: Current Some Day Smoker -- 0.25 packs/day for 23 years    Types: Cigarettes  . Smokeless tobacco: Former Systems developer    Types: Chew  . Alcohol Use: Yes     Comment: occassional only, last new years  . Drug Use: Yes    Special: Marijuana     Comment: 08/08/2013 "twice a month"  . Sexual Activity: Yes   Other Topics Concern  . Not on file   Social History Narrative    Review of Systems: A comprehensive review of systems was negative except for: as noted above per HPI  Physical Exam: Blood pressure 197/133, pulse 83, temperature 97.9 F (36.6 C), temperature source Oral, resp. rate  20, height 6' (1.829 m), weight 149 lb 14.4 oz (67.994 kg), SpO2 97 %.  Gen: A&O x 4, no acute distress, thin but fit appearing HEENT: Atraumatic, PERRL, EOMI, sclerae anicteric, moist mucous membranes Heart: Regular rate and rhythm, normal S1 S2, 2/6 systolic and diastolic murmur Lungs: Clear to auscultation bilaterally, respirations unlabored Abd: Soft, minimal tenderness in RLQ and LLQ, non-distended, + bowel sounds, no hepatosplenomegaly Ext: No  edema or cyanosis Neuro: A&O x 4, CN II-XII intact, b/l UE and LE strength 5/5, sensation intact grossly, coordination intact.  Lab results: Basic Metabolic Panel:  Recent Labs  03/27/14 1336  NA 139  K 4.0  CL 108  CO2 24  GLUCOSE 140*  BUN 13  CREATININE 1.70*  CALCIUM 9.2   CBC:  Recent Labs  03/27/14 1336  WBC 5.0  HGB 13.6  HCT 40.2  MCV 90.7  PLT 152   D-Dimer:  Recent Labs  03/27/14 1741  DDIMER 0.56*   Misc. Labs: BNP 551 i-stat troponin 0.01  Imaging results:  Dg Chest 2 View  03/27/2014   CLINICAL DATA:  Shortness of breath, cough 02/23/2014  EXAM: CHEST  2 VIEW  COMPARISON:  None.  FINDINGS: There is no focal parenchymal opacity, pleural effusion, or pneumothorax. The heart size is top normal.  The osseous structures are unremarkable.  IMPRESSION: No active cardiopulmonary disease.   Electronically Signed   By: Kathreen Devoid   On: 03/27/2014 14:57   Ct Angio Chest Pe W/cm &/or Wo Cm  03/27/2014   CLINICAL DATA:  Shortness of breath for 3 days, elevated D-dimer  EXAM: CT ANGIOGRAPHY CHEST WITH CONTRAST  TECHNIQUE: Multidetector CT imaging of the chest was performed using the standard protocol during bolus administration of intravenous contrast. Multiplanar CT image reconstructions and MIPs were obtained to evaluate the vascular anatomy.  CONTRAST:  45mL OMNIPAQUE IOHEXOL 350 MG/ML SOLN  COMPARISON:  09/19/2012  FINDINGS: Sagittal images of the spine shows minimal degenerative changes thoracic spine. Sagittal  view of the sternum is unremarkable. Images of the thoracic inlet are unremarkable. Central airways are patent. No mediastinal hematoma or adenopathy. The study is of excellent technical quality. No pulmonary embolus is noted.  Again noted aneurysmal dilatation of ascending aorta measures 4.2 cm in diameter. Stable in size in appearance from prior exam. Descending aorta measures 3 cm in diameter.  The visualized upper abdomen is unremarkable.  Images of the lung parenchyma shows no acute infiltrate or pulmonary edema. No pneumothorax.  Review of the MIP images confirms the above findings.  IMPRESSION: 1. No pulmonary embolus is noted. 2. No mediastinal hematoma or adenopathy. 3. Again noted aneurysmal dilatation of ascending aorta measures 4.2 cm in diameter. This is stable in size in appearance from prior exam. Descending aorta measures 3 cm in diameter. 4. No acute infiltrate or pulmonary edema.   Electronically Signed   By: Lahoma Crocker M.D.   On: 03/27/2014 22:15    Other results: EKG: NSR, LVH, t-wave inversion in V5, V6, QTc 464, compared to prior 02/23/14 which also had these t-wave inversions  Assessment & Plan by Problem: Principal Problem:   Dyspnea Active Problems:   Uncontrolled hypertension   AI (aortic insufficiency)   MR (mitral regurgitation)  #Dyspnea: The cause of Mr Shallenberger dyspnea is unclear. It may be related to his heart failure as he had a mildly elevated BNP (551) on presentation and some relief of symptoms following iv lasix administration in the ED. However, he was not fluid overloaded on exam and actually had a bump in his creatinine from baseline. His CXR was also clear of fluid although he may have had flash pulmonary edema (of note patients with renal artery stenosis are at risk of flash pulmonary edema and he has uncontrolled HTN). Last echo 02/2013 notable for EF 45-50%, diffuse hypokinesis, moderate AI, mild mitral valve regurgitation, R atrium mildly dilated. His aortic  insuffiency may have  gotten worse which may be contributing. Home medications for CHF are carvedilol 25 mg bid, spironolactone 25 mg daily, lasix 20 mg dailyprn. ED ordered d-dimer which was mildly elevated at 0.56 although Wells score is 0. Although he has CKD, we spoke to radiology who said he can receive reduced dose of contrast for chest CTA which ruled out PE. He denies any history of lung disease such as asthma or COPD but no PFTs in record. -CTA chest -renal artery duplex bilateral -echo -albuterol 5 mg neb q2hprn -measure peak flow -check orthostatics -A1c, HIV, lipid panel -cardiac monitor -daily weights, I&Os -consider outpatient PFTs  #Malignant HTN: Home medications are reportedly amlodipine 10 mg daily, carvedilol 25 mg bid, hydralazine 75 mg tid, lisinopril 20 mg daily, spironolactone 25 mg daily, lasix 20 mg dailyprn. Secondary cause is unclear. He had aldo/PRA ratio 50 in 2013, aldosterone 2 in 2012, plasma metanephrine 284 in 2012, and urine metanephrine 2200 in 2013. These results are discordant when evaluating primary aldosteronism. He had renal u/s 07/2011 but no Doppler which noted no hydronephrosis, echogenic renal parenchyma consistent w chronic renal medical disease. -lopressor 5 mg iv once -cont home amlodipine 10 mg daily, carvedilol 25 mg bid, hydralazine 75 mg tid -hold lisinopril in setting of AKI, spironolactone and lasix in setting of AKI -aldosterone and renin activity w ratio -renal artery duplex bilateral -check TSH, UDS, ethanol -acteaminophen 650 mg q6hprn -cardiac monitor  #CHF: Last echo 02/2013 notable for EF 45-50%, diffuse hypokinesis, moderate AI, mild mitral valve regurgitation, R atrium mildly dilated. Home medications are carvedilol 25 mg bid, spironolactone 25 mg daily, lasix 20 mg dailyprn. See above re: dyspnea and concern that his valvular disease may be progressing. -echo -cont carvedilol 25 mg bid -hold spironolactone 25 mg daily, lasix 20 mg  dailyprn -cardiac monitor -daily weights, I&Os  #Acute on CKD: Presenting creatinine 1.70 with GFR 56. Baseline appears to be ~1.5. Unclear picture as described above re dyspnea. He says he feels dehydrated but also said that the ED lasix helped his dyspnea. -hold lisinopril, lasix, spironolactone for now as described above -UA -d/c ibuprofen -renal artery duplex u/s -cont to monitor  #Diet: heart  #DVT PPx: heparin 5000 u [place] tid  #Code: full  Dispo: Disposition is deferred at this time, awaiting improvement of current medical problems. Anticipated discharge in approximately 1-2 day(s).   The patient does have a current PCP [name], MD) and does need an [facility] hospital follow-up appointment after discharge.  The patient does not know have transportation limitations that hinder transportation to clinic appointments.  Signed: Kelby Aline, MD 03/27/2014, 10:40 PM

## 2014-03-27 NOTE — ED Notes (Signed)
repaged [facility]

## 2014-03-27 NOTE — ED Notes (Signed)
Patient states he has noticed sob.  He states it is worse when he is laying down.  Patient denies hx of MI but has enlarged heart.  Patient also has leaking cardiac valve. Patient has had weight loss of 18 pounds in 3-4 mnths.  Patient states he feels full all the time.  Patient has had some dizziness with position changes.

## 2014-03-28 DIAGNOSIS — I10 Essential (primary) hypertension: Secondary | ICD-10-CM | POA: Diagnosis not present

## 2014-03-28 DIAGNOSIS — I712 Thoracic aortic aneurysm, without rupture: Secondary | ICD-10-CM | POA: Diagnosis present

## 2014-03-28 DIAGNOSIS — I7121 Aneurysm of the ascending aorta, without rupture: Secondary | ICD-10-CM | POA: Diagnosis present

## 2014-03-28 DIAGNOSIS — R0600 Dyspnea, unspecified: Secondary | ICD-10-CM

## 2014-03-28 LAB — BASIC METABOLIC PANEL
Anion gap: 7 (ref 5–15)
BUN: 15 mg/dL (ref 6–23)
CALCIUM: 8.9 mg/dL (ref 8.4–10.5)
CO2: 24 mmol/L (ref 19–32)
Chloride: 101 mEq/L (ref 96–112)
Creatinine, Ser: 1.69 mg/dL — ABNORMAL HIGH (ref 0.50–1.35)
GFR calc Af Amer: 56 mL/min — ABNORMAL LOW (ref >90)
GFR, EST NON AFRICAN AMERICAN: 49 mL/min — AB (ref >90)
Glucose, Bld: 94 mg/dL (ref 70–99)
Potassium: 3.7 mmol/L (ref 3.5–5.1)
Sodium: 132 mmol/L — ABNORMAL LOW (ref 135–145)

## 2014-03-28 LAB — COMPREHENSIVE METABOLIC PANEL
ALT: 12 U/L (ref 0–53)
AST: 18 U/L (ref 0–37)
Albumin: 3.7 g/dL (ref 3.5–5.2)
Alkaline Phosphatase: 75 U/L (ref 39–117)
Anion gap: 8 (ref 5–15)
BUN: 14 mg/dL (ref 6–23)
CO2: 25 mmol/L (ref 19–32)
Calcium: 9 mg/dL (ref 8.4–10.5)
Chloride: 102 mEq/L (ref 96–112)
Creatinine, Ser: 1.8 mg/dL — ABNORMAL HIGH (ref 0.50–1.35)
GFR calc non Af Amer: 45 mL/min — ABNORMAL LOW (ref >90)
GFR, EST AFRICAN AMERICAN: 52 mL/min — AB (ref >90)
GLUCOSE: 107 mg/dL — AB (ref 70–99)
Potassium: 3.2 mmol/L — ABNORMAL LOW (ref 3.5–5.1)
Sodium: 135 mmol/L (ref 135–145)
TOTAL PROTEIN: 6.7 g/dL (ref 6.0–8.3)
Total Bilirubin: 0.5 mg/dL (ref 0.3–1.2)

## 2014-03-28 LAB — LIPID PANEL
CHOL/HDL RATIO: 3.2 ratio
Cholesterol: 182 mg/dL (ref 0–200)
HDL: 57 mg/dL (ref >39)
LDL CALC: 115 mg/dL — AB (ref 0–99)
Triglycerides: 49 mg/dL (ref <150)
VLDL: 10 mg/dL (ref 0–40)

## 2014-03-28 LAB — ETHANOL: Alcohol, Ethyl (B): <5 mg/dL (ref 0–9)

## 2014-03-28 LAB — CK: Total CK: 122 U/L (ref 7–232)

## 2014-03-28 LAB — HEMOGLOBIN A1C
Hgb A1c MFr Bld: 5.3 % (ref <5.7)
Mean Plasma Glucose: 105 mg/dL (ref <117)

## 2014-03-28 LAB — PHOSPHORUS: Phosphorus: 4.7 mg/dL — ABNORMAL HIGH (ref 2.3–4.6)

## 2014-03-28 LAB — TSH: TSH: 2.636 u[IU]/mL (ref 0.350–4.500)

## 2014-03-28 LAB — UREA NITROGEN, URINE: Urea Nitrogen, Ur: 327 mg/dL

## 2014-03-28 LAB — MAGNESIUM: Magnesium: 1.9 mg/dL (ref 1.5–2.5)

## 2014-03-28 LAB — MRSA PCR SCREENING: MRSA BY PCR: NEGATIVE

## 2014-03-28 MED ORDER — ENSURE COMPLETE PO LIQD: 237.0000 mL | Freq: Two times a day (BID) | ORAL | Status: DC

## 2014-03-28 MED ORDER — ADULT MULTIVITAMIN W/MINERALS CH
1.0000 | ORAL_TABLET | Freq: Every day | ORAL | Status: DC
  Administered 2014-03-28: 1 via ORAL
  Filled 2014-03-28 (×2): qty 1

## 2014-03-28 MED ORDER — KETOROLAC TROMETHAMINE 30 MG/ML IJ SOLN
30.0000 mg | Freq: Three times a day (TID) | INTRAMUSCULAR | Status: DC
Start: 2014-03-28 — End: 2014-03-29
  Administered 2014-03-28: 30 mg via INTRAMUSCULAR
  Filled 2014-03-28 (×4): qty 1

## 2014-03-28 MED ORDER — METOCLOPRAMIDE HCL 10 MG PO TABS
10.0000 mg | ORAL_TABLET | Freq: Once | ORAL | Status: AC
  Administered 2014-03-28: 10 mg via ORAL
  Filled 2014-03-28: qty 1

## 2014-03-28 MED ORDER — HYDROCODONE-ACETAMINOPHEN 5-325 MG PO TABS
ORAL_TABLET | ORAL | Status: AC
  Administered 2014-03-28: 1
  Filled 2014-03-28: qty 1

## 2014-03-28 MED ORDER — POTASSIUM CHLORIDE CRYS ER 20 MEQ PO TBCR
40.0000 meq | EXTENDED_RELEASE_TABLET | Freq: Once | ORAL | Status: AC
  Administered 2014-03-28: 40 meq via ORAL
  Filled 2014-03-28: qty 2

## 2014-03-28 MED ORDER — DIPHENHYDRAMINE HCL 25 MG PO CAPS
25.0000 mg | ORAL_CAPSULE | Freq: Once | ORAL | Status: AC
  Administered 2014-03-28: 25 mg via ORAL
  Filled 2014-03-28: qty 1

## 2014-03-28 NOTE — Progress Notes (Signed)
  Echocardiogram 2D Echocardiogram has been performed.  Diamond Nickel 03/28/2014, 9:50 AM

## 2014-03-28 NOTE — Progress Notes (Signed)
INITIAL NUTRITION ASSESSMENT  DOCUMENTATION CODES Per approved criteria  -Not Applicable   INTERVENTION: Provide Ensure Complete BID, each supplement provides 350 kcal and 13 grams of protein Provide Multivitamin with minerals daily Encourage general healthful PO intake   NUTRITION DIAGNOSIS: Inadequate oral intake related to decreased appetite as evidenced by pt's report of 18% weight loss in 3 months.   Goal: Pt to meet >/= 90% of their estimated nutrition needs   Monitor:  PO intake, weight trend, supplement acceptance, labs  Reason for Assessment: Malnutrition Screening Tool  44 y.o. male  Admitting Dx: Dyspnea  ASSESSMENT: 44 year old man with CHF, cardiomyopathy 2/2 HTN and EtOH use, HTN, CKD who presents with shortness of breath.  Pt reports having a decreased appetite for the past 3 months. He states that he was weighing his usual body weight of 182 lbs 3 months ago but, due to varied PO intake (1-2 meals daily) he has lost weight. Per weight history, pt weighed 157 to 163 lbs about 3 months ago. He reports eating 100% of breakfast this morning and 75% of lunch. Discussed heart healthy, nutrient dense foods for pt to add to diet and nutritional supplements to promote weight gain.   Labs: low potassium, decreased GFR  Nutrition Focused Physical Exam:  Subcutaneous Fat:  Orbital Region: wnl Upper Arm Region: wnl Thoracic and Lumbar Region: wnl  Muscle:  Temple Region: mild wasting Clavicle Bone Region: moderate wasting Clavicle and Acromion Bone Region: moderate wasting Scapular Bone Region: mild wasting Dorsal Hand: mild wasting Patellar Region: moderate wasting Anterior Thigh Region: moderate wasting Posterior Calf Region: moderate wasting  Edema: none noted  Height: Ht Readings from Last 1 Encounters:  03/27/14 6' (1.829 m)    Weight: Wt Readings from Last 1 Encounters:  03/28/14 150 lb 14.4 oz (68.448 kg)    Ideal Body Weight: 178 lbs  %  Ideal Body Weight: 84%  Wt Readings from Last 10 Encounters:  03/28/14 150 lb 14.4 oz (68.448 kg)  02/23/14 163 lb (73.936 kg)  12/04/13 157 lb 12.8 oz (71.578 kg)  09/20/13 159 lb (72.122 kg)  09/12/13 157 lb 6.5 oz (71.4 kg)  08/09/13 156 lb 15.5 oz (71.2 kg)  05/06/13 159 lb 13.3 oz (72.5 kg)  02/21/13 156 lb 8.4 oz (71 kg)  01/27/12 162 lb 7.7 oz (73.7 kg)  08/21/11 161 lb (73.029 kg)    Usual Body Weight: 182 lbs  % Usual Body Weight: 82%  BMI:  Body mass index is 20.46 kg/(m^2).  Estimated Nutritional Needs: Kcal: 2100-2300 Protein: 80-90 grams Fluid: 2.1-2.3 L/day  Skin: intact  Diet Order:    EDUCATION NEEDS: -No education needs identified at this time   Intake/Output Summary (Last 24 hours) at 03/28/14 1321 Last data filed at 03/28/14 1000  Gross per 24 hour  Intake    720 ml  Output   1575 ml  Net   -855 ml    Last BM: 1/13   Labs:   Recent Labs Lab 03/27/14 1336 03/27/14 2316 03/28/14 0438  NA 139 135 132*  K 4.0 3.2* 3.7  CL 108 102 101  CO2 24 25 24   BUN 13 14 15   CREATININE 1.70* 1.80* 1.69*  CALCIUM 9.2 9.0 8.9  MG  --  1.9  --   PHOS  --  4.7*  --   GLUCOSE 140* 107* 94    CBG (last 3)  No results for input(s): GLUCAP in the last 72 hours.  Scheduled Meds: .  amLODipine  10 mg Oral Daily  . carvedilol  25 mg Oral BID WC  . heparin  5,000 Units Subcutaneous 3 times per day  . hydrALAZINE  75 mg Oral TID  . ketorolac  30 mg Intramuscular 3 times per day    Continuous Infusions:   Past Medical History  Diagnosis Date  . Hypertension     a. Hx of HTN urgency secondary to noncompliance. b. urinary metanephrine and catecholeamine levels normal 2013.  [name] Anxiety   . GERD (gastroesophageal reflux disease)   . Acute renal failure     June 2012 felt secondary to Toradol  . Chronic renal failure   . CHF (congestive heart failure)     a. 05/2011: Adm with pulm edema/HTN urgency, EF 35-40% with diffuse hypokinesis and moderate to  severe mitral regurgitation. Cardiomyopathy likely due to uncontrolled HTN and ETOH abuse - cath deferred due to renal insufficiency (felt due to uncontrolled HTN). bJodie Echevaria MV 06/2011: EF 37% and no ischemia or infarction. c. EF 45-50% by echo 01/2012.  . Cardiomyopathy   . HYPERLIPIDEMIA   . INGUINAL HERNIA   . ETOH abuse     a. Reported to have quit 05/2011.  . Valvular heart disease     a. Echo 05/2011: moderate to severe eccentric MR and mild to moderate AI with prolapsing left coronary cusp. b. Echo 01/2012: mild-mod AI, mild dilitation of aortic root, mild MR.  . CKD (chronic kidney disease)     a. 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  . Tobacco abuse   . Pneumonia ~ 2013  . Headache(784.0)     "q other day" (08/08/2013)  . Migraine     "twice/month" (08/08/2013)  . Chronic sinusitis   . History of medication noncompliance   . Chronic abdominal pain   . DDD (degenerative disc disease), lumbar     Past Surgical History  Procedure Laterality Date  . Ankle surgery      Fractures bilaterally  . Inguinal hernia repair Right ~ 1996  . Foot fracture surgery Bilateral 2004-2010    "got pins in both of them"    Pryor Ochoa RD, LDN Inpatient Clinical Dietitian Pager: [phone] After Hours Pager: [phone]

## 2014-03-28 NOTE — Progress Notes (Signed)
Pt refusing lab draws this am, feels it is unnecessary. Explained reason but still refuses

## 2014-03-28 NOTE — Progress Notes (Signed)
Subjective:  Patient still complaining of headache this morning. Patient is amenable to trying Toradol for the pain. Otherwise, patient denying any other complaints.  Objective: Vital signs in last 24 hours: Filed Vitals:   03/27/14 2214 03/27/14 2306 03/28/14 0141 03/28/14 0648  BP:  173/105 149/97   Pulse:  70 84   Temp:   97.9 F (36.6 C) 97.5 F (36.4 C)  TempSrc:   Oral Oral  Resp:   18 20  Height:      Weight:    150 lb 14.4 oz (68.448 kg)  SpO2: 97%  99%    Weight change:   Intake/Output Summary (Last 24 hours) at 03/28/14 0705 Last data filed at 03/28/14 [id number]  Gross per 24 hour  Intake    480 ml  Output   1375 ml  Net   -895 ml   Gen: A&O x 4, no acute distress, thin but fit appearing HEENT: Atraumatic, PERRL, EOMI, sclerae anicteric, moist mucous membranes Heart: Regular rate and rhythm, 2/6 systolic murmur Lungs: Clear to auscultation bilaterally, respirations unlabored Abd: Soft, nondistended, nontender Ext: No edema or cyanosis Neuro: A&O x 4, CN II-XII intact, b/l UE and LE strength 5/5, sensation intact grossly, coordination intact.  Lab Results: Basic Metabolic Panel:  Recent Labs Lab 03/27/14 2316 03/28/14 0438  NA 135 132*  K 3.2* 3.7  CL 102 101  CO2 25 24  GLUCOSE 107* 94  BUN 14 15  CREATININE 1.80* 1.69*  CALCIUM 9.0 8.9  MG 1.9  --   PHOS 4.7*  --    Liver Function Tests:  Recent Labs Lab 03/27/14 2316  AST 18  ALT 12  ALKPHOS 75  BILITOT 0.5  PROT 6.7  ALBUMIN 3.7   No results for input(s): LIPASE, AMYLASE in the last 168 hours. No results for input(s): AMMONIA in the last 168 hours. CBC:  Recent Labs Lab 03/27/14 1336  WBC 5.0  HGB 13.6  HCT 40.2  MCV 90.7  PLT 152   Cardiac Enzymes:  Recent Labs Lab 03/27/14 2316  CKTOTAL 122   BNP: No results for input(s): PROBNP in the last 168 hours. D-Dimer:  Recent Labs Lab 03/27/14 1741  DDIMER 0.56*   CBG: No results for input(s): GLUCAP in the last 168  hours. Hemoglobin A1C: No results for input(s): HGBA1C in the last 168 hours. Fasting Lipid Panel:  Recent Labs Lab 03/27/14 2316  CHOL 182  HDL 57  LDLCALC 115*  TRIG 49  CHOLHDL 3.2   Thyroid Function Tests:  Recent Labs Lab 03/27/14 2316  TSH 2.636   Coagulation:  Recent Labs Lab 03/27/14 2316  LABPROT 13.8  INR 1.05   Anemia Panel: No results for input(s): VITAMINB12, FOLATE, FERRITIN, TIBC, IRON, RETICCTPCT in the last 168 hours. Urine Drug Screen: Drugs of Abuse     Component Value Date/Time   LABOPIA NONE DETECTED 03/27/2014 2228   LABOPIA NEG 01/16/2009 2229   COCAINSCRNUR NONE DETECTED 03/27/2014 2228   COCAINSCRNUR NEG 01/16/2009 2229   LABBENZ NONE DETECTED 03/27/2014 2228   LABBENZ NEG 01/16/2009 2229   AMPHETMU NONE DETECTED 03/27/2014 2228   AMPHETMU NEG 01/16/2009 2229   THCU POSITIVE* 03/27/2014 2228   LABBARB NONE DETECTED 03/27/2014 2228    Alcohol Level:  Recent Labs Lab 03/27/14 2316  ETH <5   Urinalysis:  Recent Labs Lab 03/27/14 2228  COLORURINE YELLOW  LABSPEC 1.027  PHURINE 6.0  GLUCOSEU NEGATIVE  HGBUR NEGATIVE  BILIRUBINUR NEGATIVE  KETONESUR NEGATIVE  PROTEINUR NEGATIVE  UROBILINOGEN 0.2  NITRITE NEGATIVE  LEUKOCYTESUR NEGATIVE    Micro Results: Recent Results (from the past 240 hour(s))  MRSA PCR Screening     Status: None   Collection Time: 03/27/14 11:36 PM  Result Value Ref Range Status   MRSA by PCR NEGATIVE NEGATIVE Final    Comment:        The GeneXpert MRSA Assay (FDA approved for NASAL specimens only), is one component of a comprehensive MRSA colonization surveillance program. It is not intended to diagnose MRSA infection nor to guide or monitor treatment for MRSA infections.    Studies/Results: Dg Chest 2 View  03/27/2014   CLINICAL DATA:  Shortness of breath, cough 02/23/2014  EXAM: CHEST  2 VIEW  COMPARISON:  None.  FINDINGS: There is no focal parenchymal opacity, pleural effusion, or  pneumothorax. The heart size is top normal.  The osseous structures are unremarkable.  IMPRESSION: No active cardiopulmonary disease.   Electronically Signed   By: Kathreen Devoid   On: 03/27/2014 14:57   Ct Angio Chest Pe W/cm &/or Wo Cm  03/27/2014   CLINICAL DATA:  Shortness of breath for 3 days, elevated D-dimer  EXAM: CT ANGIOGRAPHY CHEST WITH CONTRAST  TECHNIQUE: Multidetector CT imaging of the chest was performed using the standard protocol during bolus administration of intravenous contrast. Multiplanar CT image reconstructions and MIPs were obtained to evaluate the vascular anatomy.  CONTRAST:  37mL OMNIPAQUE IOHEXOL 350 MG/ML SOLN  COMPARISON:  09/19/2012  FINDINGS: Sagittal images of the spine shows minimal degenerative changes thoracic spine. Sagittal view of the sternum is unremarkable. Images of the thoracic inlet are unremarkable. Central airways are patent. No mediastinal hematoma or adenopathy. The study is of excellent technical quality. No pulmonary embolus is noted.  Again noted aneurysmal dilatation of ascending aorta measures 4.2 cm in diameter. Stable in size in appearance from prior exam. Descending aorta measures 3 cm in diameter.  The visualized upper abdomen is unremarkable.  Images of the lung parenchyma shows no acute infiltrate or pulmonary edema. No pneumothorax.  Review of the MIP images confirms the above findings.  IMPRESSION: 1. No pulmonary embolus is noted. 2. No mediastinal hematoma or adenopathy. 3. Again noted aneurysmal dilatation of ascending aorta measures 4.2 cm in diameter. This is stable in size in appearance from prior exam. Descending aorta measures 3 cm in diameter. 4. No acute infiltrate or pulmonary edema.   Electronically Signed   By: Lahoma Crocker M.D.   On: 03/27/2014 22:15   Medications: I have reviewed the patient's current medications. Scheduled Meds: . amLODipine  10 mg Oral Daily  . carvedilol  25 mg Oral BID WC  . heparin  5,000 Units Subcutaneous 3  times per day  . hydrALAZINE  75 mg Oral TID   Continuous Infusions:  PRN Meds:.acetaminophen **OR** acetaminophen, albuterol, metoprolol Assessment/Plan: Principal Problem:   Dyspnea Active Problems:   Hyperlipidemia   Tobacco use   Uncontrolled hypertension   Marijuana use   Systolic CHF, chronic   AI (aortic insufficiency)   MR (mitral regurgitation)   Acute renal failure superimposed on stage 3 chronic kidney disease   Prolonged Q-T interval on ECG   Aneurysm of ascending aorta  Malignant HTN: Pressures now down after adding amlodipine 10 mg, carvedilol 25 mg BID, and hydralazine 75 mg TID. Echocardiogram showing evidence of long-standing hypertension with severe LVH. Pressures remain elevated in the 140s to 180s/100s. UDS only positive for THC. TSH within normal limits. Ethanol  negative. Previous aldosterone to renin activity ratio of 50 in 2013 with a concurrent aldosterone of 2 in 2012 are discordant for primary hyperaldosteronism. Renal artery duplex unremarkable for any evidence of renal artery stenosis.  -Continue with amlodipine 10 mg, carvedilol 25 mg twice a day, hydralazine 75 mg 3 times a day. -Continue to hold lisinopril, spironolactone in the setting of AKI    Headache: Patient with a history of headaches. States that only morphine helps with his symptoms. Likely exacerbated by his elevated blood pressures. Has not yet had adequate pain control s/p ibuprofen and acetaminophen.  -Continue with Toradol, Reglan, and Benadryl  Dyspnea: Patient states that his breathing has improved though is not back to his baseline. Likely related to heart failure with elevated BNP and relief of symptoms following diuresis of nearly 1 L. Chest x-ray clear though could be so in setting of flash pulmonary edema. D-dimer elevated though CTA with no evidence of PE. Echo from this admission showing an ejection fraction of 50% along with severe LVH, consistent with hypertension as discussed below.  Patient has a history of 23 pack years though there is no PFT in the system. -Track peak flow -Consider outpatient PFTs -Track daily weights and I's and O's -albuterol 5 mg neb q2hprn  Acute on CKD: Resolving. Creatinine trending down to 1.59 from 1.70. Baseline seems to be close to 1.5.  -BMP in morning  CHF: Echocardiogram within ejection fraction of 50% along with severe LVH.  Home medications are carvedilol 25 mg bid, spironolactone 25 mg daily, lasix 20 mg dailyprn.  -cont carvedilol 25 mg bid -hold spironolactone 25 mg daily, lasix 20 mg dailyprn for now -cardiac monitor -daily weights, I&Os  Screening: -A1c 5.3 -HIV antibody in process -Elevated LDL though ten-year ASCVD risk of 7% not warranting statin therapy at this point.  #Diet: heart  #DVT PPx: heparin 5000 u [place] tid  #Code: full  Dispo: Disposition is deferred at this time, awaiting improvement of current medical problems. Anticipated discharge in approximately 1 day(s).   The patient does have a current PCP [name], MD) and does need an [facility] hospital follow-up appointment after discharge.  The patient does not know have transportation limitations that hinder transportation to clinic appointments.  .Services Needed at time of discharge: Y = Yes, Blank = No PT:   OT:   RN:   Equipment:   Other:     LOS: 1 day   Luan Moore, MD 03/28/2014, 7:05 AM

## 2014-03-28 NOTE — Progress Notes (Signed)
Pt. C/o of headache during the night. On call MD, A. Ethelene Hal, notified. Orders placed. Pt. Stated Headache unrelieved with medication. On call MD to floor to see pt. RN will continue to monitor for changes in condition. Sharla Tankard, Katherine Roan

## 2014-03-28 NOTE — Progress Notes (Signed)
UR completed 

## 2014-03-28 NOTE — Progress Notes (Signed)
VASCULAR LAB PRELIMINARY  PRELIMINARY  PRELIMINARY  PRELIMINARY  Renal Artery Duplex Bilateral completed.    Preliminary report:  Renal/Aortic ratios and systolic velocities reveal 0-59% stenosis bilaterally.  Lindwood Coke, RVT 03/28/2014, 9:43 AM

## 2014-03-29 DIAGNOSIS — R0600 Dyspnea, unspecified: Secondary | ICD-10-CM | POA: Diagnosis not present

## 2014-03-29 LAB — BASIC METABOLIC PANEL
ANION GAP: 14 (ref 5–15)
BUN: 14 mg/dL (ref 6–23)
CHLORIDE: 102 meq/L (ref 96–112)
CO2: 23 mmol/L (ref 19–32)
Calcium: 9.2 mg/dL (ref 8.4–10.5)
Creatinine, Ser: 1.65 mg/dL — ABNORMAL HIGH (ref 0.50–1.35)
GFR calc Af Amer: 58 mL/min — ABNORMAL LOW (ref >90)
GFR calc non Af Amer: 50 mL/min — ABNORMAL LOW (ref >90)
GLUCOSE: 93 mg/dL (ref 70–99)
Potassium: 3.9 mmol/L (ref 3.5–5.1)
SODIUM: 139 mmol/L (ref 135–145)

## 2014-03-29 LAB — HIV ANTIBODY (ROUTINE TESTING W REFLEX)
HIV 1/O/2 Abs-Index Value: <1 (ref <1.00)
HIV-1/HIV-2 Ab: NONREACTIVE

## 2014-03-29 MED ORDER — CARVEDILOL 25 MG PO TABS
25.0000 mg | ORAL_TABLET | Freq: Two times a day (BID) | ORAL | Status: DC
  Filled 2014-03-29 (×3): qty 1

## 2014-03-29 MED ORDER — SUMATRIPTAN SUCCINATE 100 MG PO TABS
100.0000 mg | ORAL_TABLET | Freq: Once | ORAL | Status: DC
  Filled 2014-03-29: qty 1

## 2014-03-29 MED ORDER — HYDROCODONE-ACETAMINOPHEN 5-325 MG PO TABS: 1.0000 | ORAL_TABLET | Freq: Once | ORAL | Status: DC

## 2014-03-29 NOTE — Discharge Summary (Signed)
Name: [name] MRN: [id number] DOB: Oct 20, 1970 44 y.o. PCP: [name], MD  Date of Admission: 03/27/2014  1:41 PM Date of Discharge: 03/29/2014 Attending Physician: Murriel Hopper  Discharge Diagnosis: Principal Problem:   Dyspnea Active Problems:   Hyperlipidemia   Tobacco use   Uncontrolled hypertension   Marijuana use   Systolic CHF, chronic   AI (aortic insufficiency)   MR (mitral regurgitation)   Acute renal failure superimposed on stage 3 chronic kidney disease   Prolonged Q-T interval on ECG   Aneurysm of ascending aorta  Discharge Medications:   Medication List    ASK your doctor about these medications        amLODipine 10 MG tablet  Commonly known as:  NORVASC  Take 1 tablet (10 mg total) by mouth daily.     carvedilol 25 MG tablet  Commonly known as:  COREG  Take 1 tablet (25 mg total) by mouth 2 (two) times daily.     cyclobenzaprine 5 MG tablet  Commonly known as:  FLEXERIL  Take 1 tablet (5 mg total) by mouth 3 (three) times daily as needed for muscle spasms (please give 1st dose now).     furosemide 20 MG tablet  Commonly known as:  LASIX  Take 1 tablet (20 mg total) by mouth daily as needed for fluid or edema (shortness of breath).     hydrALAZINE 50 MG tablet  Commonly known as:  APRESOLINE  1 and 1/2 tablets (75mg ) three times a day     HYDROcodone-acetaminophen 5-325 MG per tablet  Commonly known as:  NORCO/VICODIN  Take 1 tablet by mouth every 6 (six) hours as needed for moderate pain or severe pain.     lisinopril 20 MG tablet  Commonly known as:  PRINIVIL,ZESTRIL  Take 1 tablet (20 mg total) by mouth daily.     spironolactone 25 MG tablet  Commonly known as:  ALDACTONE  Take 1 tablet (25 mg total) by mouth daily.        Disposition and follow-up:   AndreEmmett Brandy Ewing left the hospital [place].  1.  Compliance to home blood pressure medications.  Resolution of rising creatinines.  Resolution of  headaches.  2.  Labs / imaging needed at time of follow-up: BMP  3.  Pending labs/ test needing follow-up: none  Follow-up Appointments:   Discharge Instructions:   Consultations:    Procedures Performed:  Dg Chest 2 View  03/27/2014   CLINICAL DATA:  Shortness of breath, cough 02/23/2014  EXAM: CHEST  2 VIEW  COMPARISON:  None.  FINDINGS: There is no focal parenchymal opacity, pleural effusion, or pneumothorax. The heart size is top normal.  The osseous structures are unremarkable.  IMPRESSION: No active cardiopulmonary disease.   Electronically Signed   By: Kathreen Devoid   On: 03/27/2014 14:57   Ct Angio Chest Pe W/cm &/or Wo Cm  03/27/2014   CLINICAL DATA:  Shortness of breath for 3 days, elevated D-dimer  EXAM: CT ANGIOGRAPHY CHEST WITH CONTRAST  TECHNIQUE: Multidetector CT imaging of the chest was performed using the standard protocol during bolus administration of intravenous contrast. Multiplanar CT image reconstructions and MIPs were obtained to evaluate the vascular anatomy.  CONTRAST:  23mL OMNIPAQUE IOHEXOL 350 MG/ML SOLN  COMPARISON:  09/19/2012  FINDINGS: Sagittal images of the spine shows minimal degenerative changes thoracic spine. Sagittal view of the sternum is unremarkable. Images of the thoracic inlet are unremarkable. Central airways are patent. No mediastinal  hematoma or adenopathy. The study is of excellent technical quality. No pulmonary embolus is noted.  Again noted aneurysmal dilatation of ascending aorta measures 4.2 cm in diameter. Stable in size in appearance from prior exam. Descending aorta measures 3 cm in diameter.  The visualized upper abdomen is unremarkable.  Images of the lung parenchyma shows no acute infiltrate or pulmonary edema. No pneumothorax.  Review of the MIP images confirms the above findings.  IMPRESSION: 1. No pulmonary embolus is noted. 2. No mediastinal hematoma or adenopathy. 3. Again noted aneurysmal dilatation of ascending aorta measures 4.2 cm  in diameter. This is stable in size in appearance from prior exam. Descending aorta measures 3 cm in diameter. 4. No acute infiltrate or pulmonary edema.   Electronically Signed   By: Lahoma Crocker M.D.   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 He was in his usual state of health until about three days ago when he had acute onset of shortness of breath. He says it is unrelated to exertion. He normally has three pillow orthopnea but it is since increased to 4-5. Over this same time period he feels lightheaded when standing and developed a headache today. Mr Ewing also reports a 20 lb weight loss in the past 3 months and says he has had night sweats all of his life which is unchanged. He denies any chest pain, palpitations, cough, wheezing, LE swelling warmth or pain, fevers, chills, abdominal pain, nausea, emesis, diarrhea, constipation, vision changes, weakness, numbness, paresthesia. He smokes 3-4 cigarettes a day for the past 30 years. He has not drunk alcohol regularly in a year with his last drinks around Christmas. He uses marijuana recreationally but denies any other drug use.  [name] was noted to have a BP of 192/127 on presentation to the ED. He says he has been compliant with his BP medications (amlodipine 10 mg daily, carvedilol 25 mg bid, hydralazine 75 mg tid, lisinopril 20 mg daily, spironolactone 25 mg daily, lasix 20 mg dailyprn) but could not remember them from memory and thought he was supposed to take spironolactone only as needed. He has documented issues with non-compliance per EMR. He says his SBP is normally in the 140s at home.  In the ED, he received lasix 40 mg iv once, hydralazine 10 mg iv once, clonidine 0.1 mg po once, and ibuprofen 800 mg po once. He said his SOB and orthopnea was improved during his exam (after receiving these medications) but he  still had a headache and most recent BP 197/133.  Hospital Course by problem list: Principal Problem:   Dyspnea Active Problems:   Hyperlipidemia   Tobacco use   Uncontrolled hypertension   Marijuana use   Systolic CHF, chronic   AI (aortic insufficiency)   MR (mitral regurgitation)   Acute renal failure superimposed on stage 3 chronic kidney disease   Prolonged Q-T interval on ECG   Aneurysm of ascending aorta   Malignant HTN: Patient noted to have a blood pressure of 192/127 upon initial presentation to the emergency department. Patient states that he is compliant with his blood pressure medications that consist of amlodipine 10 mg daily, carvedilol 25 mg twice a day, hydralazine 75 mg 3 times a day, lisinopril 20 mg daily, sprinolactone 25 mg daily, and Lasix 20 mg daily when necessary. However, there is some suspicion  that patient may not have been compliant with his home regimen of medications given that he was not able to recall his medications from memory. He also had documented issues with noncompliance per EMR. Patient was started on a subset of his blood pressure medications including amlodipine 10 mg daily, carvedilol 25 mg twice a day, and hydralazine 75 mg 3 times a day. His spironolactone and lisinopril were held in the setting of acute kidney injury. His pressures declined after this initiation to the 140s to 180s over 100s. Urine drug screen was only positive for THC. Ethanol was negative. Previous aldosterone to renin activity ratio of 50 in 2013 with concurrent aldosterone of 2 in 2012 were discordant for primary hyperaldosteronism. A renal artery duplex performed during this admission was unremarkable for any evidence of renal artery stenosis. Given a largely negative workup for secondary causes of hypertension, it is likely that patient's presenting malignant hypertension is secondary to medication noncompliance. Additionally, patient's blood pressures were much better controlled  on only a subset of his home medications as described above. It is important to note that the patient has presented previously multiple times with a similar constellation of symptoms of hypertension, headache, and dyspnea.  Headache: Patient with a history of headaches. Patient repeatedly asked for morphine to alleviate his symptoms. Patient stated that the administered Toradol, Vicodin, ibuprofen, acetaminophen, Reglan, and Benadryl were wholly ineffective in controlling his symptoms. Patient had a normal neurological exam throughout his admission. On the morning of 03/29/2014, the night coverage team was called as patient was again asking for morphine to alleviate his headache. At this point, patient decided that he would leave [place]. Patient was offered reconciliation of his blood pressure medications and sumatriptan and to try to help alleviate his symptoms before he left. However, patient decided to leave before any of these medications were recommended to him.  Dyspnea: Patient initially presenting with dyspnea that was thought to be likely related to flash pulmonary edema that may have been exacerbated by a history of heart failure in the setting of elevated BNP. Additionally, patient's symptoms were alleviated that is post 1 L of diuresis. Chest x-ray was clear so flash pulmonary edema with had to have been transient. However, a elevated d-dimer during his admission blood to a CTA which showed no evidence of PE. Patient does have a history of 23 pack years and there are no pulmonary function tests in the system. Would recommend that patient obtain PFTs as an outpatient.  Acute on CKD: Patient with initial elevated creatinine though trended back down close to around his baseline of 1.5. Lisinopril and sprinolactone held as described above.  CHF: Echocardiogram from this admission showing ejection fraction of 50% along with severe LVH. Medication management as described under the  hypertension problem.  Screening: Patient's hemoglobin A1c was 5.3, HIV antibody was nonreactive, and an elevated LDL was found on lipid profile though his ten-year ASCVD risk of 7% does not warrant initiation of statin therapy at this point.  Discharge Vitals:   BP 141/98 mmHg  Pulse 77  Temp(Src) 98.1 F (36.7 C) (Oral)  Resp 20  Ht 6' (1.829 m)  Wt 150 lb 14.4 oz (68.448 kg)  BMI 20.46 kg/m2  SpO2 98%  Discharge Labs:  Results for orders placed or performed during the hospital encounter of 03/27/14 (from the past 24 hour(s))  Basic metabolic panel     Status: Abnormal   Collection Time: 03/29/14  4:29 AM  Result Value Ref  Range   Sodium 139 135 - 145 mmol/L   Potassium 3.9 3.5 - 5.1 mmol/L   Chloride 102 96 - 112 mEq/L   CO2 23 19 - 32 mmol/L   Glucose, Bld 93 70 - 99 mg/dL   BUN 14 6 - 23 mg/dL   Creatinine, Ser 1.65 (H) 0.50 - 1.35 mg/dL   Calcium 9.2 8.4 - 10.5 mg/dL   GFR calc non Af Amer 50 (L) >90 mL/min   GFR calc Af Amer 58 (L) >90 mL/min   Anion gap 14 5 - 15    Signed: Luan Moore, MD 03/29/2014, 1:53 PM    Services Ordered on Discharge: none Equipment Ordered on Discharge: none

## 2014-03-29 NOTE — Progress Notes (Signed)
The night team was called by RN about Mr Coatney stating he was going to leave AMA as his headache was not being adequately addressed. Dr Naaman Plummer and I went to speak to the patient who said that the only thing that will help his headaches is percocet 10-325. Of note, the night prior he received tylenol, tramadol, vicodin (his home narcotic) and said none helped and he wanted morphine. He had a normal neurologic exam. Yesterday, he received a headache cocktail of reglan benadryl and toradol, and said none of these helped and that he wants the percocet. We told him that we do not think narcotics are appropriate treatment for headaches. Additional history suggested this headache is a migraine. We offered sumatriptan and explained this is the best treatment for migraines. He said he would use whatever old percocet pills he has at home after he leaves and that his wife "has a doctor" and several types of pain medications he would use. He agreed to try to sumatriptan but was still planning to leave AMA. We told him he should stay so a safe discharge could be planned. We were notified shortly after that the patient had in fact left before we could recommend a BP regimen or he received the sumatriptan.  The patient has decided to leave against medical advice. The patient has a normal mental status, and understands the risks of leaving, including permanent disability and/or death, and has had an opportunity to ask questions about his medical condition. The patient has been informed that he may return for care at any time, and follow up has been arranged.  Lottie Mussel, MD 03/29/14 5:49 am

## 2014-03-29 NOTE — Plan of Care (Signed)
Problem: Phase I Progression Outcomes Goal: EF % per last Echo/documented,Core Reminder form on chart Outcome: Completed/Met Date Met:  03/29/14 EF = 50% per ECHO performed on 03/28/14.

## 2014-03-29 NOTE — Progress Notes (Signed)
Pt c/o headache this AM. MD notified. MDs came to floor to discuss treatment options with patient. Pt angry about his headaches during this admission, but did not specify these as migraines until the afternoon of 03/28/14. MDs and RN in room, and pt agreed to try Imitrex PO. MDs ordered this med, but pt left AMA without signing AMA document before med became available from the pharmacy. MD then notified of patient's absence.

## 2014-04-23 ENCOUNTER — Emergency Department (HOSPITAL_COMMUNITY): Payer: Medicare Other

## 2014-04-23 ENCOUNTER — Inpatient Hospital Stay (HOSPITAL_COMMUNITY)
Admission: EM | Admit: 2014-04-23 | Discharge: 2014-05-03 | DRG: 299 | Disposition: A | Discharge status: Home / Self Care | Payer: Medicare Other | Attending: Internal Medicine | Admitting: Internal Medicine

## 2014-04-23 ENCOUNTER — Encounter (HOSPITAL_COMMUNITY): Payer: Self-pay

## 2014-04-23 DIAGNOSIS — I130 Hypertensive heart and chronic kidney disease with heart failure and stage 1 through stage 4 chronic kidney disease, or unspecified chronic kidney disease: Secondary | ICD-10-CM | POA: Diagnosis present

## 2014-04-23 DIAGNOSIS — J9811 Atelectasis: Secondary | ICD-10-CM | POA: Diagnosis present

## 2014-04-23 DIAGNOSIS — R079 Chest pain, unspecified: Secondary | ICD-10-CM | POA: Diagnosis not present

## 2014-04-23 DIAGNOSIS — I10 Essential (primary) hypertension: Secondary | ICD-10-CM | POA: Diagnosis not present

## 2014-04-23 DIAGNOSIS — E872 Acidosis: Secondary | ICD-10-CM | POA: Diagnosis present

## 2014-04-23 DIAGNOSIS — Z4682 Encounter for fitting and adjustment of non-vascular catheter: Secondary | ICD-10-CM | POA: Diagnosis not present

## 2014-04-23 DIAGNOSIS — N280 Ischemia and infarction of kidney: Secondary | ICD-10-CM | POA: Diagnosis present

## 2014-04-23 DIAGNOSIS — I43 Cardiomyopathy in diseases classified elsewhere: Secondary | ICD-10-CM | POA: Diagnosis not present

## 2014-04-23 DIAGNOSIS — F17210 Nicotine dependence, cigarettes, uncomplicated: Secondary | ICD-10-CM | POA: Diagnosis present

## 2014-04-23 DIAGNOSIS — F17220 Nicotine dependence, chewing tobacco, uncomplicated: Secondary | ICD-10-CM | POA: Diagnosis present

## 2014-04-23 DIAGNOSIS — D696 Thrombocytopenia, unspecified: Secondary | ICD-10-CM | POA: Diagnosis present

## 2014-04-23 DIAGNOSIS — N170 Acute kidney failure with tubular necrosis: Secondary | ICD-10-CM | POA: Diagnosis present

## 2014-04-23 DIAGNOSIS — K5900 Constipation, unspecified: Secondary | ICD-10-CM | POA: Diagnosis not present

## 2014-04-23 DIAGNOSIS — F1290 Cannabis use, unspecified, uncomplicated: Secondary | ICD-10-CM | POA: Diagnosis present

## 2014-04-23 DIAGNOSIS — L03114 Cellulitis of left upper limb: Secondary | ICD-10-CM | POA: Diagnosis not present

## 2014-04-23 DIAGNOSIS — I517 Cardiomegaly: Secondary | ICD-10-CM | POA: Diagnosis not present

## 2014-04-23 DIAGNOSIS — E875 Hyperkalemia: Secondary | ICD-10-CM | POA: Diagnosis present

## 2014-04-23 DIAGNOSIS — N179 Acute kidney failure, unspecified: Secondary | ICD-10-CM | POA: Insufficient documentation

## 2014-04-23 DIAGNOSIS — I7101 Dissection of thoracic aorta: Secondary | ICD-10-CM | POA: Diagnosis not present

## 2014-04-23 DIAGNOSIS — Z452 Encounter for adjustment and management of vascular access device: Secondary | ICD-10-CM

## 2014-04-23 DIAGNOSIS — I7100 Dissection of unspecified site of aorta: Secondary | ICD-10-CM | POA: Diagnosis not present

## 2014-04-23 DIAGNOSIS — J90 Pleural effusion, not elsewhere classified: Secondary | ICD-10-CM | POA: Diagnosis not present

## 2014-04-23 DIAGNOSIS — E785 Hyperlipidemia, unspecified: Secondary | ICD-10-CM | POA: Diagnosis present

## 2014-04-23 DIAGNOSIS — N184 Chronic kidney disease, stage 4 (severe): Secondary | ICD-10-CM

## 2014-04-23 DIAGNOSIS — D631 Anemia in chronic kidney disease: Secondary | ICD-10-CM | POA: Diagnosis present

## 2014-04-23 DIAGNOSIS — I7103 Dissection of thoracoabdominal aorta: Principal | ICD-10-CM | POA: Diagnosis present

## 2014-04-23 DIAGNOSIS — R0902 Hypoxemia: Secondary | ICD-10-CM | POA: Diagnosis not present

## 2014-04-23 DIAGNOSIS — I711 Thoracic aortic aneurysm, ruptured: Secondary | ICD-10-CM | POA: Diagnosis not present

## 2014-04-23 DIAGNOSIS — I71012 Dissection of descending thoracic aorta: Secondary | ICD-10-CM | POA: Diagnosis present

## 2014-04-23 DIAGNOSIS — J9690 Respiratory failure, unspecified, unspecified whether with hypoxia or hypercapnia: Secondary | ICD-10-CM

## 2014-04-23 DIAGNOSIS — I7102 Dissection of abdominal aorta: Secondary | ICD-10-CM | POA: Diagnosis not present

## 2014-04-23 DIAGNOSIS — I5022 Chronic systolic (congestive) heart failure: Secondary | ICD-10-CM | POA: Diagnosis present

## 2014-04-23 DIAGNOSIS — E876 Hypokalemia: Secondary | ICD-10-CM | POA: Diagnosis present

## 2014-04-23 DIAGNOSIS — I161 Hypertensive emergency: Secondary | ICD-10-CM

## 2014-04-23 DIAGNOSIS — N183 Chronic kidney disease, stage 3 (moderate): Secondary | ICD-10-CM | POA: Diagnosis present

## 2014-04-23 DIAGNOSIS — J810 Acute pulmonary edema: Secondary | ICD-10-CM | POA: Diagnosis present

## 2014-04-23 DIAGNOSIS — Z9119 Patient's noncompliance with other medical treatment and regimen: Secondary | ICD-10-CM | POA: Diagnosis present

## 2014-04-23 DIAGNOSIS — I7779 Dissection of other artery: Secondary | ICD-10-CM | POA: Diagnosis not present

## 2014-04-23 DIAGNOSIS — J811 Chronic pulmonary edema: Secondary | ICD-10-CM | POA: Diagnosis present

## 2014-04-23 DIAGNOSIS — I808 Phlebitis and thrombophlebitis of other sites: Secondary | ICD-10-CM | POA: Diagnosis present

## 2014-04-23 DIAGNOSIS — I120 Hypertensive chronic kidney disease with stage 5 chronic kidney disease or end stage renal disease: Secondary | ICD-10-CM | POA: Diagnosis not present

## 2014-04-23 DIAGNOSIS — J9691 Respiratory failure, unspecified with hypoxia: Secondary | ICD-10-CM | POA: Diagnosis not present

## 2014-04-23 LAB — COMPREHENSIVE METABOLIC PANEL WITH GFR
ALT: 12 U/L (ref 0–53)
AST: 20 U/L (ref 0–37)
Albumin: 3.8 g/dL (ref 3.5–5.2)
Alkaline Phosphatase: 71 U/L (ref 39–117)
Anion gap: 8 (ref 5–15)
BUN: 17 mg/dL (ref 6–23)
CO2: 22 mmol/L (ref 19–32)
Calcium: 8.7 mg/dL (ref 8.4–10.5)
Chloride: 105 mmol/L (ref 96–112)
Creatinine, Ser: 2.05 mg/dL — ABNORMAL HIGH (ref 0.50–1.35)
GFR calc Af Amer: 45 mL/min — ABNORMAL LOW
GFR calc non Af Amer: 39 mL/min — ABNORMAL LOW
Glucose, Bld: 103 mg/dL — ABNORMAL HIGH (ref 70–99)
Potassium: 3.1 mmol/L — ABNORMAL LOW (ref 3.5–5.1)
Sodium: 135 mmol/L (ref 135–145)
Total Bilirubin: 0.9 mg/dL (ref 0.3–1.2)
Total Protein: 6.6 g/dL (ref 6.0–8.3)

## 2014-04-23 LAB — CBC WITH DIFFERENTIAL/PLATELET
BASOS ABS: 0 10*3/uL (ref 0.0–0.1)
Basophils Relative: 0 % (ref 0–1)
EOS ABS: 0.1 10*3/uL (ref 0.0–0.7)
Eosinophils Relative: 2 % (ref 0–5)
HCT: 34.4 % — ABNORMAL LOW (ref 39.0–52.0)
Hemoglobin: 11.7 g/dL — ABNORMAL LOW (ref 13.0–17.0)
LYMPHS PCT: 34 % (ref 12–46)
Lymphs Abs: 2.5 10*3/uL (ref 0.7–4.0)
MCH: 30.2 pg (ref 26.0–34.0)
MCHC: 34 g/dL (ref 30.0–36.0)
MCV: 88.7 fL (ref 78.0–100.0)
MONOS PCT: 10 % (ref 3–12)
Monocytes Absolute: 0.8 10*3/uL (ref 0.1–1.0)
Neutro Abs: 3.9 10*3/uL (ref 1.7–7.7)
Neutrophils Relative %: 53 % (ref 43–77)
Platelets: 81 10*3/uL — ABNORMAL LOW (ref 150–400)
RBC: 3.88 MIL/uL — ABNORMAL LOW (ref 4.22–5.81)
RDW: 15 % (ref 11.5–15.5)
WBC: 7.4 10*3/uL (ref 4.0–10.5)

## 2014-04-23 LAB — I-STAT TROPONIN, ED: Troponin i, poc: 0.01 ng/mL (ref 0.00–0.08)

## 2014-04-23 LAB — I-STAT CHEM 8, ED
BUN: 17 mg/dL (ref 6–23)
Calcium, Ion: 1.2 mmol/L (ref 1.12–1.23)
Chloride: 105 mmol/L (ref 96–112)
Creatinine, Ser: 1.9 mg/dL — ABNORMAL HIGH (ref 0.50–1.35)
Glucose, Bld: 96 mg/dL (ref 70–99)
HCT: 38 % — ABNORMAL LOW (ref 39.0–52.0)
Hemoglobin: 12.9 g/dL — ABNORMAL LOW (ref 13.0–17.0)
Potassium: 3 mmol/L — ABNORMAL LOW (ref 3.5–5.1)
Sodium: 141 mmol/L (ref 135–145)
TCO2: 21 mmol/L (ref 0–100)

## 2014-04-23 LAB — LIPASE, BLOOD: LIPASE: 23 U/L (ref 11–59)

## 2014-04-23 LAB — ETHANOL: Alcohol, Ethyl (B): <5 mg/dL (ref 0–9)

## 2014-04-23 MED ORDER — HYDROMORPHONE HCL 1 MG/ML IJ SOLN
1.0000 mg | Freq: Once | INTRAMUSCULAR | Status: AC
  Administered 2014-04-23: 1 mg via INTRAVENOUS
  Filled 2014-04-23: qty 1

## 2014-04-23 MED ORDER — ESMOLOL HCL-SODIUM CHLORIDE 2000 MG/100ML IV SOLN
25.0000 ug/kg/min | Freq: Once | INTRAVENOUS | Status: AC
  Administered 2014-04-23: 25 ug/kg/min via INTRAVENOUS
  Filled 2014-04-23: qty 100

## 2014-04-23 MED ORDER — ONDANSETRON HCL 4 MG/2ML IJ SOLN
4.0000 mg | Freq: Once | INTRAMUSCULAR | Status: AC
  Administered 2014-04-23: 4 mg via INTRAVENOUS
  Filled 2014-04-23: qty 2

## 2014-04-23 MED ORDER — NICARDIPINE HCL IN NACL 20-0.86 MG/200ML-% IV SOLN
5.0000 mg/h | Freq: Once | INTRAVENOUS | Status: DC
  Filled 2014-04-23: qty 200

## 2014-04-23 MED ORDER — IOHEXOL 350 MG/ML SOLN
100.0000 mL | Freq: Once | INTRAVENOUS | Status: AC | PRN
  Administered 2014-04-23: 100 mL via INTRAVENOUS

## 2014-04-23 NOTE — ED Notes (Signed)
The patient is unable to give an urine specimen at this time. The tech has reported to the RN in charge. 

## 2014-04-23 NOTE — ED Provider Notes (Signed)
CSN: [id number]     Arrival date & time 04/23/14  2116 History   First MD Initiated Contact with Patient 04/23/14 2124     Chief Complaint  Patient presents with  . Chest Pain     Patient is a 44 y.o. male presenting with chest pain. The history is provided by the patient. No language interpreter was used.  Chest Pain  [name] presents for evaluation of chest pain.  He reports that he developed central chest pain that started 30 minutes prior to ED arrival. He describes a sharp and stabbing and radiating to his back and lower abdomen. Symptoms are severe and constant. He denies any fevers, cough, vomiting, diarrhea. He states he has chronic shortness of breath and enlarged heart. His shortness of breath is worse than usual.  Past Medical History  Diagnosis Date  . Hypertension     a. Hx of HTN urgency secondary to noncompliance. b. urinary metanephrine and catecholeamine levels normal 2013.  [name] Anxiety   . GERD (gastroesophageal reflux disease)   . Acute renal failure     June 2012 felt secondary to Toradol  . Chronic renal failure   . CHF (congestive heart failure)     a. 05/2011: Adm with pulm edema/HTN urgency, EF 35-40% with diffuse hypokinesis and moderate to severe mitral regurgitation. Cardiomyopathy likely due to uncontrolled HTN and ETOH abuse - cath deferred due to renal insufficiency (felt due to uncontrolled HTN). bJodie Ewing MV 06/2011: EF 37% and no ischemia or infarction. c. EF 45-50% by echo 01/2012.  . Cardiomyopathy   . HYPERLIPIDEMIA   . INGUINAL HERNIA   . ETOH abuse     a. Reported to have quit 05/2011.  . Valvular heart disease     a. Echo 05/2011: moderate to severe eccentric MR and mild to moderate AI with prolapsing left coronary cusp. b. Echo 01/2012: mild-mod AI, mild dilitation of aortic root, mild MR.  . CKD (chronic kidney disease)     a. 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  . Tobacco abuse   . Pneumonia ~ 2013  . Headache(784.0)     "q other day" (08/08/2013)  .  Migraine     "twice/month" (08/08/2013)  . Chronic sinusitis   . History of medication noncompliance   . Chronic abdominal pain   . DDD (degenerative disc disease), lumbar    Past Surgical History  Procedure Laterality Date  . Ankle surgery      Fractures bilaterally  . Inguinal hernia repair Right ~ 1996  . Foot fracture surgery Bilateral 2004-2010    "got pins in both of them"   Family History  Problem Relation Age of Onset  . Hypertension    . Colon cancer Paternal Uncle    History  Substance Use Topics  . Smoking status: Current Some Day Smoker -- 0.25 packs/day for 23 years    Types: Cigarettes  . Smokeless tobacco: Former Systems developer    Types: Chew  . Alcohol Use: Yes     Comment: occassional only, last new years    Review of Systems  Cardiovascular: Positive for chest pain.  All other systems reviewed and are negative.     Allergies  Imdur  Home Medications   Prior to Admission medications   Medication Sig Start Date End Date Taking? Authorizing Provider  amLODipine (NORVASC) 10 MG tablet Take 1 tablet (10 mg total) by mouth daily. 12/04/13   Larey Dresser, MD  carvedilol (COREG) 25 MG tablet Take 1 tablet (25  mg total) by mouth 2 (two) times daily. 12/04/13   Larey Dresser, MD  cyclobenzaprine (FLEXERIL) 5 MG tablet Take 1 tablet (5 mg total) by mouth 3 (three) times daily as needed for muscle spasms (please give 1st dose now). 05/06/13   Waldemar Dickens, MD  furosemide (LASIX) 20 MG tablet Take 1 tablet (20 mg total) by mouth daily as needed for fluid or edema (shortness of breath). 09/12/13   Ejiroghene Arlyce Dice, MD  hydrALAZINE (APRESOLINE) 50 MG tablet 1 and 1/2 tablets (75mg ) three times a day Patient taking differently: Take 75 mg by mouth See admin instructions. 1 and 1/2 tablets (75mg ) three times a day 12/04/13   Larey Dresser, MD  HYDROcodone-acetaminophen (NORCO/VICODIN) 5-325 MG per tablet Take 1 tablet by mouth every 6 (six) hours as needed for  moderate pain or severe pain. 02/23/14   Harvie Heck, PA-C  lisinopril (PRINIVIL,ZESTRIL) 20 MG tablet Take 1 tablet (20 mg total) by mouth daily. 12/04/13   Larey Dresser, MD  spironolactone (ALDACTONE) 25 MG tablet Take 1 tablet (25 mg total) by mouth daily. 12/04/13   Larey Dresser, MD   BP 156/90 mmHg  Pulse 51  Temp(Src) 97.6 F (36.4 C)  Resp 16  Ht 6' (1.829 m)  Wt 156 lb (70.761 kg)  BMI 21.15 kg/m2  SpO2 97% Physical Exam  Constitutional: He is oriented to person, place, and time. He appears well-developed and well-nourished. He appears distressed.  HENT:  Head: Normocephalic and atraumatic.  Cardiovascular: Regular rhythm.   No murmur heard. Bradycardic  Pulmonary/Chest: Effort normal and breath sounds normal. No respiratory distress.  Abdominal: Soft. There is no rebound and no guarding.  Moderate diffuse abdominal tenderness  Musculoskeletal: He exhibits no edema or tenderness.  2+ Pulses in all 4 extremities.  Neurological: He is alert and oriented to person, place, and time.  Skin: Skin is warm and dry.  Psychiatric: He has a normal mood and affect. His behavior is normal.  Nursing note and vitals reviewed.   ED Course  Procedures (including critical care time) CRITICAL CARE Performed by: Quintella Reichert   Total critical care time: 45 minutes  Critical care time was exclusive of separately billable procedures and treating other patients.  Critical care was necessary to treat or prevent imminent or life-threatening deterioration.  Critical care was time spent personally by me on the following activities: development of treatment plan with patient and/or surrogate as well as nursing, discussions with consultants, evaluation of patient's response to treatment, examination of patient, obtaining history from patient or surrogate, ordering and performing treatments and interventions, ordering and review of laboratory studies, ordering and review of radiographic  studies, pulse oximetry and re-evaluation of patient's condition.  Labs Review Labs Reviewed  CBC WITH DIFFERENTIAL/PLATELET - Abnormal; Notable for the following:    RBC 3.88 (*)    Hemoglobin 11.7 (*)    HCT 34.4 (*)    Platelets 81 (*)    All other components within normal limits  COMPREHENSIVE METABOLIC PANEL - Abnormal; Notable for the following:    Potassium 3.1 (*)    Glucose, Bld 103 (*)    Creatinine, Ser 2.05 (*)    GFR calc non Af Amer 39 (*)    GFR calc Af Amer 45 (*)    All other components within normal limits  I-STAT CHEM 8, ED - Abnormal; Notable for the following:    Potassium 3.0 (*)    Creatinine, Ser 1.90 (*)  Hemoglobin 12.9 (*)    HCT 38.0 (*)    All other components within normal limits  MRSA PCR SCREENING  LIPASE, BLOOD  ETHANOL  URINE RAPID DRUG SCREEN (HOSP PERFORMED)  URINALYSIS, ROUTINE W REFLEX MICROSCOPIC  PROTIME-INR  CBC  BASIC METABOLIC PANEL  I-STAT TROPOININ, ED  TYPE AND SCREEN    Imaging Review Ct Angio Chest Aorta W/cm &/or Wo/cm  04/23/2014   CLINICAL DATA:  Chest and back pain.  EXAM: CT ANGIOGRAPHY CHEST, ABDOMEN AND PELVIS  TECHNIQUE: Multidetector CT imaging through the chest, abdomen and pelvis was performed using the standard protocol during bolus administration of intravenous contrast. Multiplanar reconstructed images and MIPs were obtained and reviewed to evaluate the vascular anatomy.  CONTRAST:  132mL OMNIPAQUE IOHEXOL 350 MG/ML SOLN  COMPARISON:  CT chest 09/08/2010.  CT abdomen and pelvis 01/24/2012  FINDINGS: CTA CHEST FINDINGS  Noncontrast images of the chest demonstrate no significant aortic or Coronary artery calcification. No evidence of intramural hematoma.  Images obtained during arterial phase of contrast injection demonstrate aortic dissection arising in the posterior aortic arch just distal to the origin of the left subclavian artery. The dissection extends throughout the descending thoracic aorta and into the abdomen.  Flow is demonstrated in both the true and false lumens. No evidence of dissection in the ascending aorta. Dilated ascending aorta with maximal diameter of 4.2 cm. No abnormal mesenteric fluid collections. Great vessel origins are patent without evidence of dissection.  Diffuse cardiac enlargement. Esophagus is decompressed. No significant lymphadenopathy in the chest. Atelectasis in both lung bases. No pneumothorax. No pleural effusions.  Review of the MIP images confirms the above findings.  CTA ABDOMEN AND PELVIS FINDINGS  Aortic dissection extends from the thoracic aorta into the abdominal aorta down to the level below the renal arteries. Dissection extends into the base of the superior mesenteric artery. The false lumen appears to be larger in size and have more brisk flow than the true lumen. The celiac axis and left renal artery appear to arise from the false lumen. The superior mesenteric artery and right renal artery appear to arise from the true lumen. Distal abdominal aorta and aortic bifurcation appear intact and patent with mild calcification. No aortic aneurysm. Asymmetry of renal nephrograms with no flow demonstrated to the left kidney. 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  The liver, spleen, gallbladder, pancreas, adrenal glands, inferior vena cava, and retroperitoneal lymph nodes are unremarkable. Stomach, small bowel, and colon are decompressed. No free air or free fluid in the abdomen.  Pelvis: Bladder wall is not thickened. Prostate gland is enlarged. No free or loculated pelvic fluid collections. No pelvic mass or lymphadenopathy. Appendix is not identified.  Bones: Normal alignment of the thoracic and lumbar spine. No destructive bone lesions appreciated.  Review of the MIP images confirms the above findings.  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  Devascularization of the left kidney. Left renal artery origin and celiac axis origin appear from the false lumen. Superior mesenteric artery and right renal artery origins appear from the true lumen.  These results were called by telephone at the time of interpretation on 04/23/2014 at 10:43 pm to Dr. Quintella Reichert , who verbally acknowledged these results.   Electronically Signed   By: Lucienne Capers M.D.   On: 04/23/2014 22:45   Ct Cta Abd/pel W/cm &/or W/o Cm  04/23/2014   CLINICAL DATA:  Chest and back pain.  EXAM: CT ANGIOGRAPHY CHEST, ABDOMEN AND PELVIS  TECHNIQUE: Multidetector CT imaging through the chest, abdomen and pelvis was performed using the standard protocol during bolus administration of intravenous contrast. Multiplanar reconstructed images and MIPs were obtained and reviewed to evaluate the vascular anatomy.  CONTRAST:  142mL OMNIPAQUE IOHEXOL 350 MG/ML SOLN  COMPARISON:  CT chest 09/08/2010.  CT abdomen and pelvis 01/24/2012  FINDINGS: CTA CHEST FINDINGS  Noncontrast images of the chest demonstrate no significant aortic or Coronary artery calcification. No evidence of intramural hematoma.  Images obtained during arterial phase of contrast injection demonstrate aortic dissection arising in the posterior aortic arch just distal to the origin of the left subclavian artery. The dissection extends throughout the descending thoracic aorta and into the abdomen. Flow is demonstrated in both the true and false lumens. No evidence of dissection in the ascending aorta. Dilated ascending aorta with maximal diameter of 4.2 cm. No abnormal mesenteric fluid collections. Great vessel origins are patent without evidence of dissection.  Diffuse cardiac enlargement. Esophagus is decompressed. No significant lymphadenopathy in the chest. Atelectasis in both lung bases. No pneumothorax. No pleural effusions.  Review of the MIP images confirms the above findings.  CTA ABDOMEN AND PELVIS FINDINGS  Aortic dissection  extends from the thoracic aorta into the abdominal aorta down to the level below the renal arteries. Dissection extends into the base of the superior mesenteric artery. The false lumen appears to be larger in size and have more brisk flow than the true lumen. The celiac axis and left renal artery appear to arise from the false lumen. The superior mesenteric artery and right renal artery appear to arise from the true lumen. Distal abdominal aorta and aortic bifurcation appear intact and patent with mild calcification. No aortic aneurysm. Asymmetry of renal nephrograms with no flow demonstrated to the left kidney. 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  The liver, spleen, gallbladder, pancreas, adrenal glands, inferior vena cava, and retroperitoneal lymph nodes are unremarkable. Stomach, small bowel, and colon are decompressed. No free air or free fluid in the abdomen.  Pelvis: Bladder wall is not thickened. Prostate gland is enlarged. No free or loculated pelvic fluid collections. No pelvic mass or lymphadenopathy. Appendix is not identified.  Bones: Normal alignment of the thoracic and lumbar spine. No destructive bone lesions appreciated.  Review of the MIP images confirms the above findings.  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 Devascularization of the left kidney. Left renal artery origin and celiac axis origin appear from the false lumen. Superior mesenteric artery and right renal artery origins appear from the true lumen.  These results were called by telephone at the time of interpretation on 04/23/2014 at 10:43 pm to Dr. Quintella Reichert , who verbally acknowledged these results.   Electronically Signed   By: Lucienne Capers M.D.   On: 04/23/2014 22:45     EKG Interpretation   Date/Time:  Monday April 23 2014 21:24:58 EST Ventricular Rate:  50 PR Interval:   170 QRS Duration: 112 QT Interval:  492 QTC Calculation: 449 R Axis:   -17 Text Interpretation:  Sinus rhythm Left atrial enlargement LVH with  secondary repolarization abnormality Anterior Q waves, possibly due to LVH  Confirmed by Hazle Coca [phone]) on 04/23/2014 9:29:27 PM      MDM  Final diagnoses:  Aortic dissection, thoracoabdominal  Hypertensive emergency    Patient here for evaluation of chest pain. Patient noted to be in severe distress and markedly hypertensive on ED evaluation. CTA obtained which is consistent with acute aortic dissection. Patient given Dilaudid for pain control and attempted to control blood pressure with esmolol and Cardene drip. Consult vascular surgeon on-call regarding dissection with devascularization of the left kidney - will see in consult. Discussed with intensivist regarding admission for further medical management.  Quintella Reichert, MD 04/24/14 [phone]

## 2014-04-23 NOTE — H&P (Signed)
PULMONARY / CRITICAL CARE MEDICINE   Name: [name] MRN: [id number] DOB: Oct 03, 1970    ADMISSION DATE:  04/23/2014 CONSULTATION DATE:  04/23/2014  REFERRING MD :  EDP  CHIEF COMPLAINT:  Chest pain  INITIAL PRESENTATION: 44 year old male presented to [facility] ED 2/8 c/o stabbing chest pain/back. He has history of multiple admissions for uncontrolled HTN. CTA shows type B aortic dissection. PCCM consulted for admission.  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 Devascularization of the left kidney. Left renal artery origin and celiac axis origin appear from the false lumen. Superior mesenteric artery and right renal artery origins appear from the true lumen.  SIGNIFICANT EVENTS:  HISTORY OF PRESENT ILLNESS:  44 year old male with PMH as below, which includes HTN, CKD, ETOH, valvular heart disease, and CHF presented to [facility] ED 2/8 complaining of chest pain. He has 9 admissions in the last 2 years for uncontrolled hypertension with suspicion of non-compliance. Most recently, he had an admission for hypertensive urgency, and left AMA 03/29/13 due to lack of pain management medications. He presented again 2/8 for sharp stabbing chest pain, radiates to back, for 30 minutes. In ED he was hypertensive with SBP ~ 190s. CTA chest was obtained and found Type B aortic dissection. PCCM to evaluate for admission.  PAST MEDICAL HISTORY :   has a past medical history of Hypertension; Anxiety; GERD (gastroesophageal reflux disease); Acute renal failure; Chronic renal failure; CHF (congestive heart failure); Cardiomyopathy; HYPERLIPIDEMIA; INGUINAL HERNIA; ETOH abuse; Valvular heart disease; CKD (chronic kidney disease); Tobacco abuse; Pneumonia (~ 2013); Headache(784.0); Migraine; Chronic sinusitis; History of medication noncompliance; Chronic abdominal pain; and DDD  (degenerative disc disease), lumbar.  has past surgical history that includes Ankle surgery; Inguinal hernia repair (Right, ~ 1996); and Foot fracture surgery (Bilateral, 2004-2010). Prior to Admission medications   Medication Sig Start Date End Date Taking? Authorizing Provider  amLODipine (NORVASC) 10 MG tablet Take 1 tablet (10 mg total) by mouth daily. 12/04/13  Yes Larey Dresser, MD  carvedilol (COREG) 25 MG tablet Take 1 tablet (25 mg total) by mouth 2 (two) times daily. 12/04/13  Yes Larey Dresser, MD  cyclobenzaprine (FLEXERIL) 5 MG tablet Take 1 tablet (5 mg total) by mouth 3 (three) times daily as needed for muscle spasms (please give 1st dose now). 05/06/13  Yes Waldemar Dickens, MD  furosemide (LASIX) 20 MG tablet Take 1 tablet (20 mg total) by mouth daily as needed for fluid or edema (shortness of breath). 09/12/13  Yes Ejiroghene E Emokpae, MD  hydrALAZINE (APRESOLINE) 50 MG tablet 1 and 1/2 tablets (75mg ) three times a day Patient taking differently: Take 75 mg by mouth See admin instructions. 1 and 1/2 tablets (75mg ) three times a day 12/04/13  Yes Larey Dresser, MD  HYDROcodone-acetaminophen (NORCO/VICODIN) 5-325 MG per tablet Take 1 tablet by mouth every 6 (six) hours as needed for moderate pain or severe pain. 02/23/14  Yes Harvie Heck, PA-C  lisinopril (PRINIVIL,ZESTRIL) 20 MG tablet Take 1 tablet (20 mg total) by mouth daily. 12/04/13  Yes Larey Dresser, MD  spironolactone (ALDACTONE) 25 MG tablet Take 1 tablet (25 mg total) by mouth daily. 12/04/13  Yes Larey Dresser, MD   Allergies  Allergen Reactions  . Imdur [Isosorbide] Other (See Comments)    headache  FAMILY HISTORY:  has no family status information on file.  SOCIAL HISTORY:  reports that he has been smoking Cigarettes.  He has a 5.75 pack-year smoking history. He has quit using smokeless tobacco. His smokeless tobacco use included Chew. He reports that he drinks alcohol. He reports that he uses illicit drugs  (Marijuana).  REVIEW OF SYSTEMS:   Bolds are positive  Constitutional: weight loss, gain, night sweats, Fevers, chills, fatigue .  HEENT: headaches, Sore throat, sneezing, nasal congestion, post nasal drip, Difficulty swallowing, Tooth/dental problems, visual complaints visual changes, ear ache CV:  chest pain, radiates:back ,Orthopnea, PND, swelling in lower extremities, dizziness, palpitations, syncope.  GI  heartburn, indigestion, abdominal pain, nausea, vomiting, diarrhea, change in bowel habits, loss of appetite, bloody stools.  Resp: cough, productive: , hemoptysis, dyspnea, chest pain, pleuritic.  Skin: rash or itching or icterus GU: dysuria, change in color of urine, urgency or frequency. flank pain, hematuria  MS: joint pain or swelling. decreased range of motion  Psych: change in mood or affect. depression or anxiety.  Neuro: difficulty with speech, weakness, numbness, ataxia    SUBJECTIVE:   VITAL SIGNS: Temp:  [97.6 F (36.4 C)] 97.6 F (36.4 C) (02/08 2125) Pulse Rate:  [49-83] 83 (02/08 2238) Resp:  [16-37] 22 (02/08 2238) BP: (145-190)/(87-104) 190/104 mmHg (02/08 2238) SpO2:  [94 %-98 %] 94 % (02/08 2238) Weight:  [70.761 kg (156 lb)] 70.761 kg (156 lb) (02/08 2125) HEMODYNAMICS:   VENTILATOR SETTINGS:   INTAKE / OUTPUT: No intake or output data in the 24 hours ending 04/23/14 2328  PHYSICAL EXAMINATION: General:  Thin, male in NAD Neuro:  Alert, oriented, non-focal HEENT:  [place]/AT, no JVD noted Cardiovascular:  RRR, no MRG Lungs:  Clear bilateral breath sounds Abdomen:  Soft, non-distended Musculoskeletal:  No acute deformity or ROM limitation Skin:  Grossly intact  LABS:  CBC  Recent Labs Lab 04/23/14 2136 04/23/14 2137  WBC 7.4  --   HGB 11.7* 12.9*  HCT 34.4* 38.0*  PLT 81*  --    Coag's No results for input(s): APTT, INR in the last 168 hours. BMET  Recent Labs Lab 04/23/14 2136 04/23/14 2137  NA 135 141  K 3.1* 3.0*  CL 105 105   CO2 22  --   BUN 17 17  CREATININE 2.05* 1.90*  GLUCOSE 103* 96   Electrolytes  Recent Labs Lab 04/23/14 2136  CALCIUM 8.7   Sepsis Markers No results for input(s): LATICACIDVEN, PROCALCITON, O2SATVEN in the last 168 hours. ABG No results for input(s): PHART, PCO2ART, PO2ART in the last 168 hours. Liver Enzymes  Recent Labs Lab 04/23/14 2136  AST 20  ALT 12  ALKPHOS 71  BILITOT 0.9  ALBUMIN 3.8   Cardiac Enzymes No results for input(s): TROPONINI, PROBNP in the last 168 hours. Glucose No results for input(s): GLUCAP in the last 168 hours.  Imaging No results found.   ASSESSMENT / PLAN:  PULMONARY A: Dyspnea  P:   Supplemental O2 as needed to maintain SpO2 > 92% F/u CXR in AM IS  CARDIOVASCULAR A:  Type B aortic dissection Hypertensive emergency H/o CHF, HTN, cardiomyopathy (etoh vs HTN related)  P:  Vascular following SBP goal < 150 1st 6 hours, then ~ 110 HR goal ~ 50 Esmolol to achieve these goals Nicardipine gtt if HTN refractory to esmolol Morphine PRN for severe chest pain Frequent BP monitoring  RENAL A:   Acute on CKD - no perfusion to L kidney on CTA Hypokalemia  P:   Follow Bmet Supp K  GASTROINTESTINAL A:   GERD  P:   NPO as may need surgical intervention (low suspicion) SUP: IV PPI  HEMATOLOGIC A:   No acute issues  P:  Follow CBC Coags SCDs  INFECTIOUS A:   No acute evidence for infection  P:   Monitor clinically   ENDOCRINE A:   No acute issues  P:   Follow glucose on chemistry  NEUROLOGIC A:   No acute issues   P:   RASS goal: 0 Monitor   FAMILY  - Updates: Patient and family at bedside  - Inter-disciplinary family meet or Palliative Care meeting due by:  2/15   Georgann Housekeeper, AGACNP-BC [place] [facility] Pager [phone] or [phone]

## 2014-04-23 NOTE — ED Notes (Signed)
Pt comes via [facility], was driving home starting having central CP, EMS states pt was diaphoretic originally, vomited X 1, PTA received 3 nitro 324 ASA 4 mg zofran.

## 2014-04-23 NOTE — Consult Note (Signed)
[facility] [place] HISTORY AND PHYSICAL  Reason for consult: acute aortic dissection Requesting: [place] History of Present Illness:  Patient is a 44 y.o. year old male who presents for evaluation of acute aortic dissection. Pt with history of several years of poor blood pressure control presented to ER today with chest and back pain as well as left side abdominal pain.  Pt with multiple prior admissions with BP in the 200s over the last several years.  Multiple hospital admissions as recently as January of this year where he signed out AMA.  Pt states he has been taking his BP meds as described at home but states he never checks his blood pressure at home.  No syncope today.  No pain in feet or legs no vomiting or bloody diarrhea.  Denies use of cocaine. Other medical problems include chronic renal failure creatinine previously 1.9, CHF with hypertensive cardiomyopathy, hyperlipidemia, tobacco abuse all currently stable except renal function which is worse this admission.  Pt on carvedilol, lasix, amlodipine, lisinopril, hydralazine, aldactone as outpt and states he takes all of these meds   Past Medical History  Diagnosis Date  . Hypertension     a. Hx of HTN urgency secondary to noncompliance. b. urinary metanephrine and catecholeamine levels normal 2013.  [name] Anxiety   . GERD (gastroesophageal reflux disease)   . Acute renal failure     June 2012 felt secondary to Toradol  . Chronic renal failure   . CHF (congestive heart failure)     a. 05/2011: Adm with pulm edema/HTN urgency, EF 35-40% with diffuse hypokinesis and moderate to severe mitral regurgitation. Cardiomyopathy likely due to uncontrolled HTN and ETOH abuse - cath deferred due to renal insufficiency (felt due to uncontrolled HTN). bJodie Echevaria MV 06/2011: EF 37% and no ischemia or infarction. c. EF 45-50% by echo 01/2012.  . Cardiomyopathy   . HYPERLIPIDEMIA   . INGUINAL HERNIA   . ETOH abuse     a. Reported to  have quit 05/2011.  . Valvular heart disease     a. Echo 05/2011: moderate to severe eccentric MR and mild to moderate AI with prolapsing left coronary cusp. b. Echo 01/2012: mild-mod AI, mild dilitation of aortic root, mild MR.  . CKD (chronic kidney disease)     a. 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  . Tobacco abuse   . Pneumonia ~ 2013  . Headache(784.0)     "q other day" (08/08/2013)  . Migraine     "twice/month" (08/08/2013)  . Chronic sinusitis   . History of medication noncompliance   . Chronic abdominal pain   . DDD (degenerative disc disease), lumbar     Past Surgical History  Procedure Laterality Date  . Ankle surgery      Fractures bilaterally  . Inguinal hernia repair Right ~ 1996  . Foot fracture surgery Bilateral 2004-2010    "got pins in both of them"    Social History History  Substance Use Topics  . Smoking status: Current Some Day Smoker -- 0.25 packs/day for 23 years    Types: Cigarettes  . Smokeless tobacco: Former Systems developer    Types: Chew  . Alcohol Use: Yes     Comment: occassional only, last new years    Family History Family History  Problem Relation Age of Onset  . Hypertension    . Colon cancer Paternal Uncle     Allergies  Allergies  Allergen Reactions  . Imdur [Isosorbide] Other (See Comments)  headache     Current Facility-Administered Medications  Medication Dose Route Frequency Provider Last Rate Last Dose  . nicardipine (CARDENE) 20mg  in 0.86% saline 277ml IV infusion (0.1 mg/ml)  5 mg/hr Intravenous Once Quintella Reichert, MD       Current Outpatient Prescriptions  Medication Sig Dispense Refill  . amLODipine (NORVASC) 10 MG tablet Take 1 tablet (10 mg total) by mouth daily. 30 tablet 3  . carvedilol (COREG) 25 MG tablet Take 1 tablet (25 mg total) by mouth 2 (two) times daily. 60 tablet 3  . cyclobenzaprine (FLEXERIL) 5 MG tablet Take 1 tablet (5 mg total) by mouth 3 (three) times daily as needed for muscle spasms (please give 1st dose  now). 30 tablet 0  . furosemide (LASIX) 20 MG tablet Take 1 tablet (20 mg total) by mouth daily as needed for fluid or edema (shortness of breath). 30 tablet 1  . hydrALAZINE (APRESOLINE) 50 MG tablet 1 and 1/2 tablets (75mg ) three times a day (Patient taking differently: Take 75 mg by mouth See admin instructions. 1 and 1/2 tablets (75mg ) three times a day) 135 tablet 3  . HYDROcodone-acetaminophen (NORCO/VICODIN) 5-325 MG per tablet Take 1 tablet by mouth every 6 (six) hours as needed for moderate pain or severe pain. 15 tablet 0  . lisinopril (PRINIVIL,ZESTRIL) 20 MG tablet Take 1 tablet (20 mg total) by mouth daily. 30 tablet 3  . spironolactone (ALDACTONE) 25 MG tablet Take 1 tablet (25 mg total) by mouth daily. 30 tablet 3    ROS:   General:  No weight loss, Fever, chills  HEENT: + recent headaches, no nasal bleeding, no visual changes, no sore throat  Neurologic: No dizziness, blackouts, seizures. No recent symptoms of stroke or mini- stroke. No recent episodes of slurred speech, or temporary blindness.  Cardiac: + recent episodes of chest pain/pressure, no shortness of breath at rest.  No shortness of breath with exertion.  Denies history of atrial fibrillation or irregular heartbeat  Vascular: No history of rest pain in feet.  No history of claudication.  No history of non-healing ulcer, No history of DVT   Pulmonary: No home oxygen, no productive cough, no hemoptysis,  No asthma or wheezing  Musculoskeletal:  [ ]  Arthritis, [ ]  Low back pain,  [ ]  Joint pain  Hematologic:No history of hypercoagulable state.  No history of easy bleeding.  No history of anemia  Gastrointestinal: No hematochezia or melena,  + gastroesophageal reflux, no trouble swallowing  Urinary: [x ] chronic Kidney disease, [ ]  on HD - [ ]  MWF or [ ]  TTHS, [ ]  Burning with urination, [ ]  Frequent urination, [ ]  Difficulty urinating;   Skin: No rashes  Psychological: + history of anxiety,  No history of  depression   Physical Examination  Filed Vitals:   04/23/14 2145 04/23/14 2200 04/23/14 2238 04/23/14 2337  BP: 161/91 171/87 190/104 185/111  Pulse:  68 83 83  Temp:      Resp: 27 37 22 20  Height:      Weight:      SpO2: 98% 96% 94% 94%    Body mass index is 21.15 kg/(m^2).  General:  Alert and oriented, no acute distress HEENT: Normal Neck: No JVD, 2+ carotid pulses Pulmonary: Clear to auscultation bilaterally Cardiac: Regular Rate and Rhythm Abdomen: Soft, non-tender, non-distended, no mass, complains of left side abdominal pain Skin: No rash, feet hands pink warm Extremity Pulses:  2+ radial, brachial,1+ right femoral,2+  dorsalis pedis  and posterior tibial pulses bilaterally Musculoskeletal: No deformity or edema  Neurologic: Upper and lower extremity motor 5/5 and symmetric  DATA:  CTA no perfusion left kidney, celiac and SMA patent, SMA off false lumen, right renal off false lumen both perfused, iliac/femorals well perfused.  Aortic diameter 29.5 mm at level of subclavian, 32 mm mid thoracic, no rupture, no type A component.  Dissection begins adjacent to left subclavian and ends at mid infrarenal aorta  CBC    Component Value Date/Time   WBC 7.4 04/23/2014 2136   RBC 3.88* 04/23/2014 2136   HGB 12.9* 04/23/2014 2137   HCT 38.0* 04/23/2014 2137   PLT 81* 04/23/2014 2136   MCV 88.7 04/23/2014 2136   MCH 30.2 04/23/2014 2136   MCHC 34.0 04/23/2014 2136   RDW 15.0 04/23/2014 2136   LYMPHSABS 2.5 04/23/2014 2136   MONOABS 0.8 04/23/2014 2136   EOSABS 0.1 04/23/2014 2136   BASOSABS 0.0 04/23/2014 2136     BMET    Component Value Date/Time   NA 141 04/23/2014 2137   K 3.0* 04/23/2014 2137   CL 105 04/23/2014 2137   CO2 22 04/23/2014 2136   GLUCOSE 96 04/23/2014 2137   BUN 17 04/23/2014 2137   CREATININE 1.90* 04/23/2014 2137   CALCIUM 8.7 04/23/2014 2136   GFRNONAA 39* 04/23/2014 2136   GFRAA 45* 04/23/2014 2136       ASSESSMENT:  Acute type B  dissection, poorly controlled hypertension, history of non compliance   PLAN:  1.  Pt admitted to critical care service for tight BP control.  I would set goal for BP under [id number] systolic and heart rate under 60.  2.  Left kidney is non salvageable.  Will continue to follow for worsening end organ damage will consider repair if continued worsening renal function or evidence of gut ischemia or limb perfusion deficits or rupture.  However, operative intervention for acute dissection has overall fairly poor results so mainstay of therapy will be medical management and hopefully heal this area of dissection with preservation of end organs.    Ruta Hinds, MD [facility] [place] Office: [phone] Pager: [phone]

## 2014-04-24 ENCOUNTER — Inpatient Hospital Stay (HOSPITAL_COMMUNITY): Payer: Medicare Other

## 2014-04-24 DIAGNOSIS — I7101 Dissection of thoracic aorta: Secondary | ICD-10-CM | POA: Diagnosis present

## 2014-04-24 DIAGNOSIS — I7103 Dissection of thoracoabdominal aorta: Secondary | ICD-10-CM

## 2014-04-24 DIAGNOSIS — I71012 Dissection of descending thoracic aorta: Secondary | ICD-10-CM | POA: Diagnosis present

## 2014-04-24 DIAGNOSIS — I711 Thoracic aortic aneurysm, ruptured: Secondary | ICD-10-CM

## 2014-04-24 HISTORY — DX: Dissection of descending thoracic aorta: I71.012

## 2014-04-24 LAB — CBC
HEMATOCRIT: 35.6 % — AB (ref 39.0–52.0)
HEMOGLOBIN: 11.8 g/dL — AB (ref 13.0–17.0)
MCH: 29.7 pg (ref 26.0–34.0)
MCHC: 33.1 g/dL (ref 30.0–36.0)
MCV: 89.7 fL (ref 78.0–100.0)
Platelets: 86 10*3/uL — ABNORMAL LOW (ref 150–400)
RBC: 3.97 MIL/uL — ABNORMAL LOW (ref 4.22–5.81)
RDW: 15 % (ref 11.5–15.5)
WBC: 7.8 10*3/uL (ref 4.0–10.5)

## 2014-04-24 LAB — BASIC METABOLIC PANEL
ANION GAP: 8 (ref 5–15)
Anion gap: 8 (ref 5–15)
Anion gap: 12 (ref 5–15)
Anion gap: 8 (ref 5–15)
BUN: 18 mg/dL (ref 6–23)
BUN: 19 mg/dL (ref 6–23)
BUN: 22 mg/dL (ref 6–23)
BUN: 20 mg/dL (ref 6–23)
CHLORIDE: 107 mmol/L (ref 96–112)
CO2: 22 mmol/L (ref 19–32)
CO2: 19 mmol/L (ref 19–32)
CO2: 13 mmol/L — AB (ref 19–32)
CO2: 16 mmol/L — ABNORMAL LOW (ref 19–32)
CREATININE: 2.57 mg/dL — AB (ref 0.50–1.35)
Calcium: 9 mg/dL (ref 8.4–10.5)
Calcium: 8.7 mg/dL (ref 8.4–10.5)
Calcium: 8.6 mg/dL (ref 8.4–10.5)
Calcium: 8 mg/dL — ABNORMAL LOW (ref 8.4–10.5)
Chloride: 108 mmol/L (ref 96–112)
Chloride: 108 mmol/L (ref 96–112)
Chloride: 108 mmol/L (ref 96–112)
Creatinine, Ser: 2.07 mg/dL — ABNORMAL HIGH (ref 0.50–1.35)
Creatinine, Ser: 2.75 mg/dL — ABNORMAL HIGH (ref 0.50–1.35)
Creatinine, Ser: 2.78 mg/dL — ABNORMAL HIGH (ref 0.50–1.35)
GFR calc Af Amer: 44 mL/min — ABNORMAL LOW (ref >90)
GFR calc non Af Amer: 38 mL/min — ABNORMAL LOW (ref >90)
GFR calc non Af Amer: 29 mL/min — ABNORMAL LOW (ref >90)
GFR calc non Af Amer: 27 mL/min — ABNORMAL LOW (ref >90)
GFR calc non Af Amer: 27 mL/min — ABNORMAL LOW (ref >90)
GFR, EST AFRICAN AMERICAN: 34 mL/min — AB (ref >90)
GFR, EST AFRICAN AMERICAN: 31 mL/min — AB (ref >90)
GFR, EST AFRICAN AMERICAN: 31 mL/min — AB (ref >90)
GLUCOSE: 132 mg/dL — AB (ref 70–99)
Glucose, Bld: 119 mg/dL — ABNORMAL HIGH (ref 70–99)
Glucose, Bld: 108 mg/dL — ABNORMAL HIGH (ref 70–99)
Glucose, Bld: 121 mg/dL — ABNORMAL HIGH (ref 70–99)
POTASSIUM: 3.9 mmol/L (ref 3.5–5.1)
POTASSIUM: 5.8 mmol/L — AB (ref 3.5–5.1)
POTASSIUM: 4.7 mmol/L (ref 3.5–5.1)
Potassium: 5.4 mmol/L — ABNORMAL HIGH (ref 3.5–5.1)
SODIUM: 138 mmol/L (ref 135–145)
Sodium: 135 mmol/L (ref 135–145)
Sodium: 132 mmol/L — ABNORMAL LOW (ref 135–145)
Sodium: 132 mmol/L — ABNORMAL LOW (ref 135–145)

## 2014-04-24 LAB — TYPE AND SCREEN
ABO/RH(D): A POS
ANTIBODY SCREEN: NEGATIVE

## 2014-04-24 LAB — URINALYSIS, ROUTINE W REFLEX MICROSCOPIC
Glucose, UA: NEGATIVE mg/dL
HGB URINE DIPSTICK: NEGATIVE
Ketones, ur: NEGATIVE mg/dL
LEUKOCYTES UA: NEGATIVE
NITRITE: NEGATIVE
Protein, ur: 100 mg/dL — AB
UROBILINOGEN UA: 0.2 mg/dL (ref 0.0–1.0)
pH: 5.5 (ref 5.0–8.0)

## 2014-04-24 LAB — RAPID URINE DRUG SCREEN, HOSP PERFORMED
Amphetamines: NOT DETECTED
Barbiturates: NOT DETECTED
Benzodiazepines: NOT DETECTED
COCAINE: NOT DETECTED
OPIATES: POSITIVE — AB
TETRAHYDROCANNABINOL: POSITIVE — AB

## 2014-04-24 LAB — PROTIME-INR
INR: 1.11 (ref 0.00–1.49)
Prothrombin Time: 14.4 seconds (ref 11.6–15.2)

## 2014-04-24 LAB — URINE MICROSCOPIC-ADD ON

## 2014-04-24 LAB — MRSA PCR SCREENING: MRSA BY PCR: NEGATIVE

## 2014-04-24 MED ORDER — SODIUM CHLORIDE 0.9 % IV BOLUS (SEPSIS)
1000.0000 mL | Freq: Once | INTRAVENOUS | Status: AC
  Administered 2014-04-24: 1000 mL via INTRAVENOUS

## 2014-04-24 MED ORDER — LABETALOL HCL 5 MG/ML IV SOLN
20.0000 mg | INTRAVENOUS | Status: DC | PRN
Start: 2014-04-24 — End: 2014-04-24
  Administered 2014-04-24: 20 mg via INTRAVENOUS
  Filled 2014-04-24: qty 4

## 2014-04-24 MED ORDER — NICARDIPINE HCL IN NACL 20-0.86 MG/200ML-% IV SOLN
3.0000 mg/h | INTRAVENOUS | Status: DC
  Administered 2014-04-24 (×3): 7.5 mg/h via INTRAVENOUS
  Administered 2014-04-24 (×2): 12.5 mg/h via INTRAVENOUS
  Administered 2014-04-24: 7.5 mg/h via INTRAVENOUS
  Administered 2014-04-24: 5 mg/h via INTRAVENOUS
  Administered 2014-04-24: 12.5 mg/h via INTRAVENOUS
  Administered 2014-04-24 (×2): 7.5 mg/h via INTRAVENOUS
  Administered 2014-04-25 (×6): 12.5 mg/h via INTRAVENOUS
  Filled 2014-04-24 (×12): qty 200

## 2014-04-24 MED ORDER — POTASSIUM CHLORIDE CRYS ER 20 MEQ PO TBCR
40.0000 meq | EXTENDED_RELEASE_TABLET | Freq: Once | ORAL | Status: AC
  Administered 2014-04-24: 40 meq via ORAL
  Filled 2014-04-24: qty 2

## 2014-04-24 MED ORDER — SODIUM POLYSTYRENE SULFONATE 15 GM/60ML PO SUSP
30.0000 g | Freq: Once | ORAL | Status: AC
  Administered 2014-04-24: 15 g via ORAL
  Filled 2014-04-24: qty 120

## 2014-04-24 MED ORDER — PANTOPRAZOLE SODIUM 40 MG IV SOLR
40.0000 mg | Freq: Every day | INTRAVENOUS | Status: DC
  Administered 2014-04-24: 40 mg via INTRAVENOUS
  Filled 2014-04-24 (×2): qty 40

## 2014-04-24 MED ORDER — HYDROMORPHONE HCL 1 MG/ML IJ SOLN
1.0000 mg | INTRAMUSCULAR | Status: DC | PRN
  Administered 2014-04-24 – 2014-05-03 (×60): 1 mg via INTRAVENOUS
  Filled 2014-04-24 (×60): qty 1

## 2014-04-24 MED ORDER — SODIUM CHLORIDE 0.9 % IV SOLN: 250.0000 mL | INTRAVENOUS | Status: DC | PRN

## 2014-04-24 MED ORDER — CETYLPYRIDINIUM CHLORIDE 0.05 % MT LIQD
7.0000 mL | Freq: Two times a day (BID) | OROMUCOSAL | Status: DC
Start: 2014-04-25 — End: 2014-04-25

## 2014-04-24 MED ORDER — LABETALOL HCL 5 MG/ML IV SOLN
20.0000 mg | INTRAVENOUS | Status: DC | PRN
  Administered 2014-04-24 – 2014-04-25 (×2): 20 mg via INTRAVENOUS
  Filled 2014-04-24 (×2): qty 4

## 2014-04-24 MED ORDER — SODIUM CHLORIDE 0.9 % IV BOLUS (SEPSIS)
1000.0000 mL | Freq: Once | INTRAVENOUS | Status: DC
Start: 2014-04-24 — End: 2014-04-30

## 2014-04-24 MED ORDER — SODIUM CHLORIDE 0.9 % IV SOLN
INTRAVENOUS | Status: DC
  Administered 2014-04-24 – 2014-04-29 (×2): via INTRAVENOUS

## 2014-04-24 MED ORDER — ESMOLOL HCL-SODIUM CHLORIDE 2000 MG/100ML IV SOLN
25.0000 ug/kg/min | INTRAVENOUS | Status: DC
  Administered 2014-04-24 (×3): 300 ug/kg/min via INTRAVENOUS
  Administered 2014-04-24: 200 ug/kg/min via INTRAVENOUS
  Administered 2014-04-24: 300 ug/kg/min via INTRAVENOUS
  Administered 2014-04-24: 125 ug/kg/min via INTRAVENOUS
  Administered 2014-04-24: 290 ug/kg/min via INTRAVENOUS
  Administered 2014-04-24 (×2): 300 ug/kg/min via INTRAVENOUS
  Administered 2014-04-25: 125 ug/kg/min via INTRAVENOUS
  Administered 2014-04-25: 100 ug/kg/min via INTRAVENOUS
  Filled 2014-04-24 (×14): qty 100

## 2014-04-24 MED ORDER — AMLODIPINE BESYLATE 10 MG PO TABS
10.0000 mg | ORAL_TABLET | Freq: Every day | ORAL | Status: DC
  Administered 2014-04-24 – 2014-05-03 (×10): 10 mg via ORAL
  Filled 2014-04-24 (×10): qty 1

## 2014-04-24 MED ORDER — LABETALOL HCL 300 MG PO TABS
300.0000 mg | ORAL_TABLET | Freq: Two times a day (BID) | ORAL | Status: DC
  Administered 2014-04-24 – 2014-05-03 (×19): 300 mg via ORAL
  Filled 2014-04-24 (×20): qty 1

## 2014-04-24 MED ORDER — MORPHINE SULFATE 2 MG/ML IJ SOLN
2.0000 mg | INTRAMUSCULAR | Status: DC | PRN
  Administered 2014-04-24: 6 mg via INTRAVENOUS
  Administered 2014-04-24: 4 mg via INTRAVENOUS
  Filled 2014-04-24: qty 2
  Filled 2014-04-24: qty 3

## 2014-04-24 MED ORDER — MORPHINE SULFATE 2 MG/ML IJ SOLN
2.0000 mg | INTRAMUSCULAR | Status: DC | PRN
  Administered 2014-04-24: 2 mg via INTRAVENOUS
  Filled 2014-04-24: qty 1

## 2014-04-24 NOTE — Progress Notes (Signed)
No new complaints No distress BP controlled on esmolol and nicardipine gtts  Filed Vitals:   04/24/14 1500 04/24/14 1543 04/24/14 1600 04/24/14 1700  BP: 117/80  136/86 111/91  Pulse: 43  54 55  Temp:  97.4 F (36.3 C)    TempSrc:  Oral    Resp: 18  15 17   Height:      Weight:      SpO2: 91%  92% 93%   NAD HEENT WNL No JVD noted Chest clear RRR NABS, NT Ext warm without edema Neuro intact   I have reviewed all of today's lab results. 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  After 5:30 PM or weekends, call [phone]

## 2014-04-24 NOTE — Progress Notes (Signed)
Utilization Review Completed.Yossef Gilkison T2/11/2014  

## 2014-04-24 NOTE — Progress Notes (Signed)
[place] Progress Note Patient Name: [name] DOB: 1970-11-24 MRN: [id number]   Date of Service  04/24/2014  HPI/Events of Note  K rising, AKI  eICU Interventions  Kayexalate x 1     Intervention Category Intermediate Interventions: Electrolyte abnormality - evaluation and management  ALVA,RAKESH V. 04/24/2014, 8:01 PM

## 2014-04-24 NOTE — Consult Note (Signed)
Films reviewed again with Dr Trula Slade.  There is not enough room between the great vessels for stent graft coverage.  The ascending aorta is too dilated for the existing stent grafts if we covered all the great vessels and did debranching revascularization for all of these.  In our opinion if he becomes unstable or progresses to end organ ischemia he would need replacement of his ascending aorta by cardiac surgery with arch debranching and we could then bring in a transfemoral graft to repair the descending portion of the dissection.   I have asked Dr Servando Snare to eval pt.  For now as long as pt remains stable would continue course of strict BP and heart rate control with close monitoring of organ systems and hemodynamics.  Emphasized to pt that this is a life threatening situation and we will need his participation and compliance.  Ruta Hinds, MD [facility] [place] Office: [phone] Pager: [phone]

## 2014-04-24 NOTE — Progress Notes (Signed)
Patient ID: [name], male   DOB: [birth year], 44 y.o.   MRN: [id number]      [address]       [place] [address]             [phone]        [name] Medical Record [id number] Date of Birth: July 13, 1970  Referring: Dr Oneida Alar  Primary Care: [name], MD  Chief Complaint:    Chief Complaint  Patient presents with  . Chest Pain    History of Present Illness:     Patient is a 44 y.o. year old male who presented to ER last night and now asked to see  evaluation of acute type III aortic dissection. Pt has h history of several years of poor blood pressure control presented to ER sudden back pain that then radiated into chest and left flank. No paresthesias  in lower extremities or syncope. No pain in feet or legs no vomiting or bloody diarrhea or abdominal pain. Pt has had  multiple prior admissions with BP in the 200s over the last several years. Pt states he has been taking his BP meds as described at home but  never checks his blood pressure at home. . Denies use of cocaine. Other medical problems include chronic renal failure creatinine previously 1.9, CHF with hypertensive cardiomyopathy, hyperlipidemia, tobacco use .  Pt on carvedilol, lasix, amlodipine, lisinopril, hydralazine, aldactone as outpt and states he takes all of these meds   Current Activity/ Functional Status: Patient is independent with mobility/ambulation, transfers, ADL's, IADL's. works full time as Clinical biochemist    Zubrod Score: At the time of surgery this patient's most appropriate activity status/level should be described as: [x]     0    Normal activity, no symptoms []     1    Restricted in physical strenuous activity but ambulatory, able to do out light work []     2    Ambulatory and capable of self care, unable to do work activities, up and about                 more than 50%  Of the time                            []     3    Only limited self care, in bed greater  than 50% of waking hours []     4    Completely disabled, no self care, confined to bed or chair []     5    Moribund  Past Medical History  Diagnosis Date  . Hypertension     a. Hx of HTN urgency secondary to noncompliance. b. urinary metanephrine and catecholeamine levels normal 2013.  [name] Anxiety   . GERD (gastroesophageal reflux disease)   . Acute renal failure     June 2012 felt secondary to Toradol  . Chronic renal failure   . CHF (congestive heart failure)     a. 05/2011: Adm with pulm edema/HTN urgency, EF 35-40% with diffuse hypokinesis and moderate to severe mitral regurgitation. Cardiomyopathy likely due to uncontrolled HTN and ETOH abuse - cath deferred due to renal insufficiency (felt due to uncontrolled HTN). bJodie Echevaria MV 06/2011: EF 37% and no ischemia or infarction. c. EF 45-50% by echo 01/2012.  . Cardiomyopathy   . HYPERLIPIDEMIA   . INGUINAL HERNIA   . ETOH abuse     a.  Reported to have quit 05/2011.  . Valvular heart disease     a. Echo 05/2011: moderate to severe eccentric MR and mild to moderate AI with prolapsing left coronary cusp. b. Echo 01/2012: mild-mod AI, mild dilitation of aortic root, mild MR.  . CKD (chronic kidney disease)     a. 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  . Tobacco abuse   . Pneumonia ~ 2013  . Headache(784.0)     "q other day" (08/08/2013)  . Migraine     "twice/month" (08/08/2013)  . Chronic sinusitis   . History of medication noncompliance   . Chronic abdominal pain   . DDD (degenerative disc disease), lumbar     Past Surgical History  Procedure Laterality Date  . Ankle surgery      Fractures bilaterally  . Inguinal hernia repair Right ~ 1996  . Foot fracture surgery Bilateral 2004-2010    "got pins in both of them"    History  Smoking status  . Current Some Day Smoker -- 0.25 packs/day for 23 years  . Types: Cigarettes  Smokeless tobacco  . Former Systems developer  . Types: Chew    History  Alcohol Use  . Yes    Comment: occassional only,  last new years    History   Social History  . Marital Status: Married    Spouse Name: N/A    Number of Children: 7  . Years of Education: N/A   Occupational History  . Disabled, part-time Programmer, systems     Disability   Social History Main Topics  . Smoking status: Current Some Day Smoker -- 0.25 packs/day for 23 years    Types: Cigarettes  . Smokeless tobacco: Former Systems developer    Types: Chew  . Alcohol Use: Yes     Comment: occassional only, last new years  . Drug Use: Yes    Special: Marijuana     Comment: 08/08/2013 "twice a month"  . Sexual Activity: Yes   Other Topics Concern  . Not on file   Social History Narrative    Allergies  Allergen Reactions  . Imdur [Isosorbide] Other (See Comments)    headache    Current Facility-Administered Medications  Medication Dose Route Frequency Provider Last Rate Last Dose  . 0.9 %  sodium chloride infusion  250 mL Intravenous PRN Corey Harold, NP      . 0.9 %  sodium chloride infusion   Intravenous Continuous Corey Harold, NP 10 mL/hr at 04/24/14 1000    . [START ON 04/25/2014] antiseptic oral rinse (CPC / CETYLPYRIDINIUM CHLORIDE 0.05%) solution 7 mL  7 mL Mouth Rinse BID Brand Males, MD      . esmolol (BREVIBLOC) 2000 mg / 100 mL (20 mg/mL) infusion  25-300 mcg/kg/min Intravenous Titrated Corey Harold, NP 63.7 mL/hr at 04/24/14 1026 300 mcg/kg/min at 04/24/14 1026  . HYDROmorphone (DILAUDID) injection 1 mg  1 mg Intravenous Q2H PRN Corey Harold, NP   1 mg at 04/24/14 0955  . labetalol (NORMODYNE,TRANDATE) injection 20-60 mg  20-60 mg Intravenous Q10 min PRN Corey Harold, NP   20 mg at 04/24/14 0141  . nicardipine (CARDENE) 20mg  in 0.86% saline 242ml IV infusion (0.1 mg/ml)  3-15 mg/hr Intravenous Continuous Corey Harold, NP 75 mL/hr at 04/24/14 1000 7.5 mg/hr at 04/24/14 1000  . pantoprazole (PROTONIX) injection 40 mg  40 mg Intravenous QHS Corey Harold, NP   40 mg at 04/24/14 0222    Prescriptions prior to  admission  Medication Sig Dispense Refill Last Dose  . amLODipine (NORVASC) 10 MG tablet Take 1 tablet (10 mg total) by mouth daily. 30 tablet 3 04/22/2014 at Unknown time  . carvedilol (COREG) 25 MG tablet Take 1 tablet (25 mg total) by mouth 2 (two) times daily. 60 tablet 3 04/22/2014 at 0900  . cyclobenzaprine (FLEXERIL) 5 MG tablet Take 1 tablet (5 mg total) by mouth 3 (three) times daily as needed for muscle spasms (please give 1st dose now). 30 tablet 0 4 months  . furosemide (LASIX) 20 MG tablet Take 1 tablet (20 mg total) by mouth daily as needed for fluid or edema (shortness of breath). 30 tablet 1 4 months  . hydrALAZINE (APRESOLINE) 50 MG tablet 1 and 1/2 tablets (75mg ) three times a day (Patient taking differently: Take 75 mg by mouth See admin instructions. 1 and 1/2 tablets (75mg ) three times a day) 135 tablet 3 04/22/2014 at Unknown time  . HYDROcodone-acetaminophen (NORCO/VICODIN) 5-325 MG per tablet Take 1 tablet by mouth every 6 (six) hours as needed for moderate pain or severe pain. 15 tablet 0 4 months  . lisinopril (PRINIVIL,ZESTRIL) 20 MG tablet Take 1 tablet (20 mg total) by mouth daily. 30 tablet 3 04/22/2014 at Unknown time  . spironolactone (ALDACTONE) 25 MG tablet Take 1 tablet (25 mg total) by mouth daily. 30 tablet 3 04/22/2014 at Unknown time    Family History  Problem Relation Age of Onset  . Hypertension    . Colon cancer Paternal Uncle      Review of Systems:      Cardiac Review of Systems: Y or N  Chest Pain [ Y   ]  Resting SOB [ N  ] Exertional SOB  [N  ]  Orthopnea Aqua.Slicker  ]   Pedal Edema [ N  ]    Palpitations Aqua.Slicker  ] Syncope  [ N ]   Presyncope [  N ]  General Review of Systems: [Y] = yes [  ]=no Constitional: recent weight change [  ]; anorexia [  ]; fatigue [  ]; nausea [  ]; night sweats [  ]; fever [N  ]; or chills [  ]                                                               Dental: poor dentition[  ]; Last Dentist visit:   Eye : blurred vision [  ];  diplopia [   ]; vision changes [  ];  Amaurosis fugax[  ]; Resp: cough [  ];  wheezing[  ];  hemoptysis[  ]; shortness of breath[  ]; paroxysmal nocturnal dyspnea[ N ]; dyspnea on exertion[ Y ]; or orthopnea[  ];  GI:  gallstones[  ], vomiting[N  ];  dysphagia[  ]; melena[  N];  hematochezia [ N ]; heartburn[  ];   Hx of  Colonoscopy[ N ]; GU: kidney stones [  ]; hematuria[ N ];   dysuria [  ];  nocturia[  ];  history of     obstruction [  ]; urinary frequency [  ]             Skin: rash, swelling[  ];, hair loss[  ];  peripheral edema[  ];  or itching[  ];  Musculosketetal: myalgias[  ];  joint swelling[  ];  joint erythema[  ];  joint pain[  ];  back pain[  ];  Heme/Lymph: bruising[  ];  bleeding[  ];  anemia[  ];  Neuro: Sharlene.Ates  ];  headaches[  N];  stroke[ N ];  vertigo[  ];  seizures[N  ];   paresthesias[  ];  difficulty walking[ N ];  Psych:depression[  ]; anxiety[  ];  Endocrine: diabetes[  ];  thyroid dysfunction[  ];  Immunizations: Flu [ n ]; Pneumococcal[n ];  Other:  Physical Exam: BP 115/80 mmHg  Pulse 57  Temp(Src) 97.5 F (36.4 C) (Oral)  Resp 13  Ht 6' (1.829 m)  Wt 154 lb 8.7 oz (70.1 kg)  BMI 20.96 kg/m2  SpO2 90%   General appearance: alert, cooperative, appears stated age and no distress Head: Normocephalic, without obvious abnormality, atraumatic Neck: no adenopathy, no carotid bruit, no JVD, supple, symmetrical, trachea midline and thyroid not enlarged, symmetric, no tenderness/mass/nodules Lymph nodes: Cervical, supraclavicular, and axillary nodes normal. Resp: clear to auscultation bilaterally Back: symmetric, no curvature. ROM normal. No CVA tenderness. Cardio: regular rate and rhythm, S1, S2 normal, no murmur, click, rub or gallop GI: soft, non-tender; bowel sounds normal; no masses,  no organomegaly Genitalia: foley just placed Extremities: extremities normal, atraumatic, no cyanosis or edema and Homans sign is negative, no sign of DVT Neurologic: Alert  and oriented X 3, normal strength and tone. Normal symmetric reflexes. Normal coordination and gait Lowe extremity neuro exam intact with good strength and sensation   Diagnostic Studies & Laboratory data:     Recent Radiology Findings:   Dg Chest Port 1 View  04/24/2014   CLINICAL DATA:  Known aortic dissection. Assess for underlying airspace disease. Subsequent encounter.  EXAM: PORTABLE CHEST - 1 VIEW  COMPARISON:  CTA of the chest performed 04/23/2014  FINDINGS: Bibasilar airspace opacities are noted, with underlying vascular congestion, concerning for mild pulmonary edema. This is mostly new from the prior CTA. No definite pleural effusion or pneumothorax is seen.  The cardiomediastinal silhouette is enlarged. No acute osseous abnormalities are identified.  IMPRESSION: 1. Bibasilar airspace opacities, with underlying vascular congestion, concerning for mild pulmonary edema, mostly new from the prior study. 2. Enlarged cardiomediastinal silhouette, as previously noted.   Electronically Signed   By: Garald Balding M.D.   On: 04/24/2014 07:03   Ct Angio Chest Aorta W/cm &/or Wo/cm Ct Cta Abd/pel W/cm &/or W/o Cm  04/23/2014   CLINICAL DATA:  Chest and back pain.  EXAM: CT ANGIOGRAPHY CHEST, ABDOMEN AND PELVIS  TECHNIQUE: Multidetector CT imaging through the chest, abdomen and pelvis was performed using the standard protocol during bolus administration of intravenous contrast. Multiplanar reconstructed images and MIPs were obtained and reviewed to evaluate the vascular anatomy.  CONTRAST:  141mL OMNIPAQUE IOHEXOL 350 MG/ML SOLN  COMPARISON:  CT chest 09/08/2010.  CT abdomen and pelvis 01/24/2012  FINDINGS: CTA CHEST FINDINGS  Noncontrast images of the chest demonstrate no significant aortic or Coronary artery calcification. No evidence of intramural hematoma.  Images obtained during arterial phase of contrast injection demonstrate aortic dissection arising in the posterior aortic arch just distal to the  origin of the left subclavian artery. The dissection extends throughout the descending thoracic aorta and into the abdomen. Flow is demonstrated in both the true and false lumens. No evidence of dissection in the ascending aorta. Dilated ascending aorta with maximal diameter of 4.2 cm. No abnormal mesenteric fluid collections. [employer]  vessel origins are patent without evidence of dissection.  Diffuse cardiac enlargement. Esophagus is decompressed. No significant lymphadenopathy in the chest. Atelectasis in both lung bases. No pneumothorax. No pleural effusions.  Review of the MIP images confirms the above findings.  CTA ABDOMEN AND PELVIS FINDINGS  Aortic dissection extends from the thoracic aorta into the abdominal aorta down to the level below the renal arteries. Dissection extends into the base of the superior mesenteric artery. The false lumen appears to be larger in size and have more brisk flow than the true lumen. The celiac axis and left renal artery appear to arise from the false lumen. The superior mesenteric artery and right renal artery appear to arise from the true lumen. Distal abdominal aorta and aortic bifurcation appear intact and patent with mild calcification. No aortic aneurysm. Asymmetry of renal nephrograms with no flow demonstrated to the left kidney. 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  The liver, spleen, gallbladder, pancreas, adrenal glands, inferior vena cava, and retroperitoneal lymph nodes are unremarkable. Stomach, small bowel, and colon are decompressed. No free air or free fluid in the abdomen.  Pelvis: Bladder wall is not thickened. Prostate gland is enlarged. No free or loculated pelvic fluid collections. No pelvic mass or lymphadenopathy. Appendix is not identified.  Bones: Normal alignment of the thoracic and lumbar spine. No destructive bone lesions appreciated.  Review of the MIP images confirms the above findings.  IMPRESSION: Type B aortic dissection arising just distal  to the origin of the left subclavian artery and extending into the abdominal aorta below the level of the renal arteries. Devascularization of the left kidney. Left renal artery origin and celiac axis origin appear from the false lumen. Superior mesenteric artery and right renal artery origins appear from the true lumen.  These results were called by telephone at the time of interpretation on 04/23/2014 at 10:43 pm to Dr. Quintella Reichert , who verbally acknowledged these results.   Electronically Signed   By: Lucienne Capers M.D.   On: 04/23/2014 22:45   I have independently reviewed the above radiology studies  and reviewed the findings with the patient. And discussed with Dr Rock Nephew and Dr Oneida Alar        Recent Lab Findings: Lab Results  Component Value Date   WBC 7.8 04/24/2014   HGB 11.8* 04/24/2014   HCT 35.6* 04/24/2014   PLT 86* 04/24/2014   GLUCOSE 119* 04/24/2014   CHOL 182 03/27/2014   TRIG 49 03/27/2014   HDL 57 03/27/2014   LDLCALC 115* 03/27/2014   ALT 12 04/23/2014   AST 20 04/23/2014   NA 138 04/24/2014   K 3.9 04/24/2014   CL 108 04/24/2014   CREATININE 2.07* 04/24/2014   BUN 18 04/24/2014   CO2 22 04/24/2014   TSH 2.636 03/27/2014   INR 1.11 04/24/2014   HGBA1C 5.3 03/27/2014   Chronic Kidney Disease   Stage I     GFR >90  Stage II    GFR 60-89  Stage IIIA GFR 45-59  Stage IIIB GFR 30-44  Stage IV   GFR 15-29  Stage V    GFR  <15  Lab Results  Component Value Date   CREATININE 2.07* 04/24/2014   Estimated Creatinine Clearance: 46.6 mL/min (by C-G formula based on Cr of 2.07).   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 Thoracic stent graft will be difficulty proximally due to  inadequate landing zone and size. If requires surgery may to consider arch replacement the stent graft. Will follow with you.     I  spent 40 minutes counseling the patient face to face and 50% or more the  time was spent in counseling and coordination of care. The total time spent in the appointment was 60 minutes.    Grace Isaac MD      [address] [place] [address] Office [phone]   Beeper [phone]  04/24/2014 10:41 AM

## 2014-04-24 NOTE — Progress Notes (Signed)
Came to see patient for decreased UOP.  Patient making about 10cc/hr Still c/o back pain and numbness in his fingers and toes  On exam, he has easily palpable radial and pedal pulses bilaterally with good strength Cr earlier today is elevated from baseline  Suspect patient is volume down, and that with contrast load fromhis CT is contributing to worsening renal issues.  Will give 2 L IV saline bolus and repeat Cr  Wells Avin Gibbons

## 2014-04-24 NOTE — Progress Notes (Signed)
At 1900 pt was on 6L [place].  Pt began to desat into the mid 80s.  Pt placed on 50% Venti mask at 2030.  Pt continued to desat in the mid 80s.  Pt placed on 100% NRB at 2130.  Pt sats on NRB range from 90-94%.  Pt stated the mask felt like it was "suffocating him" and stated he wanted the "thing with the 2 prongs back".  I explained to him that he was not oxygenating well on the [place] but he refused to wear the mask.  Placed pt on 6L [place] and pt immediately desated to 82%.  Educated and encouraged pt to wear mask.  Pt finally agreed and NBR placed back on pt.  Pt sats currently 95%.  Will continue to monitor pt closely.

## 2014-04-24 NOTE — Consult Note (Signed)
[facility] has chest pain "10/10"   Objective 116/80 58 97.5 F (36.4 C) (Oral) 17 91%  Intake/Output Summary (Last 24 hours) at 04/24/14 0851 Last data filed at 04/24/14 0800  Gross per 24 hour  Intake    880 ml  Output    500 ml  Net    380 ml   Appears fairly comfortable 2+ DP pulses radial pulses bilaterally Abdomen tense some tenderness left side right side non tender Neuro: UE/LE 5/5/ motor bilaterally, sensation intact to light touch  Laboratory Lab Results:  Recent Labs  04/23/14 2136 04/23/14 2137 04/24/14 0245  WBC 7.4  --  7.8  HGB 11.7* 12.9* 11.8*  HCT 34.4* 38.0* 35.6*  PLT 81*  --  86*   BMET  Recent Labs  04/23/14 2136 04/23/14 2137 04/24/14 0245  NA 135 141 138  K 3.1* 3.0* 3.9  CL 105 105 108  CO2 22  --  22  GLUCOSE 103* 96 119*  BUN 17 17 18   CREATININE 2.05* 1.90* 2.07*  CALCIUM 8.7  --  9.0    COAG Lab Results  Component Value Date   INR 1.11 04/24/2014   INR 1.05 03/27/2014   INR 1.18 05/23/2011   No results found for: PTT   Assessment/Planning: Acute Type B dissection.  Overall BP much better control. Urine output approx. 50 mL/hr.  Creatinine stable no evidence of gut ischemia currently with unchanged abd exam and no leukocytosis.  Subjectively pain not much different but does appear fairly comfortable  Continue to keep NPO for now while still having pain.  Ok for an occasional ice chip.  CT images further reviewed this a.m. With my partner Dr Trula Slade.  We agree that course of strict BP control and maintenance of fluid status best for now.  Will consider repair if becomes unstable or end organ damage becomes progressive.  So far renal function appears fairly stable.  Consider foley catheter if patient will consent to this to closer monitor urine output.  q6h lab for the next 24 hours.  Anelly Samarin E 04/24/2014 8:51 AM --

## 2014-04-25 ENCOUNTER — Inpatient Hospital Stay (HOSPITAL_COMMUNITY): Payer: Medicare Other

## 2014-04-25 DIAGNOSIS — I10 Essential (primary) hypertension: Secondary | ICD-10-CM

## 2014-04-25 DIAGNOSIS — I7101 Dissection of thoracic aorta: Secondary | ICD-10-CM

## 2014-04-25 DIAGNOSIS — N179 Acute kidney failure, unspecified: Secondary | ICD-10-CM

## 2014-04-25 LAB — CBC
HCT: 35.1 % — ABNORMAL LOW (ref 39.0–52.0)
HEMOGLOBIN: 11.6 g/dL — AB (ref 13.0–17.0)
MCH: 30 pg (ref 26.0–34.0)
MCHC: 33 g/dL (ref 30.0–36.0)
MCV: 90.7 fL (ref 78.0–100.0)
Platelets: 82 10*3/uL — ABNORMAL LOW (ref 150–400)
RBC: 3.87 MIL/uL — AB (ref 4.22–5.81)
RDW: 15.6 % — ABNORMAL HIGH (ref 11.5–15.5)
WBC: 7.5 10*3/uL (ref 4.0–10.5)

## 2014-04-25 LAB — BASIC METABOLIC PANEL
Anion gap: 11 (ref 5–15)
BUN: 20 mg/dL (ref 6–23)
CALCIUM: 8.8 mg/dL (ref 8.4–10.5)
CO2: 15 mmol/L — ABNORMAL LOW (ref 19–32)
Chloride: 109 mmol/L (ref 96–112)
Creatinine, Ser: 2.85 mg/dL — ABNORMAL HIGH (ref 0.50–1.35)
GFR calc Af Amer: 30 mL/min — ABNORMAL LOW (ref >90)
GFR calc non Af Amer: 26 mL/min — ABNORMAL LOW (ref >90)
Glucose, Bld: 128 mg/dL — ABNORMAL HIGH (ref 70–99)
Potassium: 4.8 mmol/L (ref 3.5–5.1)
Sodium: 135 mmol/L (ref 135–145)

## 2014-04-25 LAB — TSH: TSH: 0.724 u[IU]/mL (ref 0.350–4.500)

## 2014-04-25 MED ORDER — CETYLPYRIDINIUM CHLORIDE 0.05 % MT LIQD
7.0000 mL | Freq: Two times a day (BID) | OROMUCOSAL | Status: DC
  Administered 2014-04-25 – 2014-04-29 (×10): 7 mL via OROMUCOSAL

## 2014-04-25 MED ORDER — NICARDIPINE HCL IN NACL 40-0.83 MG/200ML-% IV SOLN
3.0000 mg/h | INTRAVENOUS | Status: DC
  Administered 2014-04-25: 15 mg/h via INTRAVENOUS
  Administered 2014-04-25: 20 mg/h via INTRAVENOUS
  Administered 2014-04-25: 12.5 mg/h via INTRAVENOUS
  Administered 2014-04-25: 15 mg/h via INTRAVENOUS
  Administered 2014-04-25 – 2014-04-26 (×7): 20 mg/h via INTRAVENOUS
  Administered 2014-04-26 – 2014-04-27 (×3): 18 mg/h via INTRAVENOUS
  Administered 2014-04-27: 15 mg/h via INTRAVENOUS
  Administered 2014-04-27: 17 mg/h via INTRAVENOUS
  Administered 2014-04-28: 13 mg/h via INTRAVENOUS
  Administered 2014-04-28 (×4): 10 mg/h via INTRAVENOUS
  Administered 2014-04-29: 8 mg/h via INTRAVENOUS
  Administered 2014-04-29: 10 mg/h via INTRAVENOUS
  Administered 2014-04-29: 8 mg/h via INTRAVENOUS
  Administered 2014-04-30: 5 mg/h via INTRAVENOUS
  Filled 2014-04-25 (×29): qty 200

## 2014-04-25 MED ORDER — HYDROMORPHONE HCL 1 MG/ML IJ SOLN
INTRAMUSCULAR | Status: AC
  Filled 2014-04-25: qty 1

## 2014-04-25 MED ORDER — CHLORHEXIDINE GLUCONATE 0.12 % MT SOLN
15.0000 mL | Freq: Two times a day (BID) | OROMUCOSAL | Status: DC
  Administered 2014-04-25 – 2014-04-29 (×9): 15 mL via OROMUCOSAL
  Filled 2014-04-25 (×9): qty 15

## 2014-04-25 MED ORDER — SORBITOL 70 % SOLN
30.0000 mL | Freq: Every day | Status: DC | PRN
  Filled 2014-04-25: qty 30

## 2014-04-25 MED ORDER — HYDRALAZINE HCL 20 MG/ML IJ SOLN
10.0000 mg | INTRAMUSCULAR | Status: DC
  Administered 2014-04-25: 10 mg via INTRAVENOUS
  Filled 2014-04-25 (×2): qty 1

## 2014-04-25 MED ORDER — HYDRALAZINE HCL 20 MG/ML IJ SOLN
20.0000 mg | INTRAMUSCULAR | Status: DC
  Administered 2014-04-25 – 2014-04-27 (×11): 20 mg via INTRAVENOUS
  Filled 2014-04-25 (×17): qty 1

## 2014-04-25 MED ORDER — HYDRALAZINE HCL 20 MG/ML IJ SOLN
20.0000 mg | Freq: Once | INTRAMUSCULAR | Status: AC
  Administered 2014-04-25: 20 mg via INTRAVENOUS

## 2014-04-25 NOTE — Progress Notes (Signed)
Remains on esmolol, nicardipine, sys controlled  Filed Vitals:   04/25/14 0930 04/25/14 1000 04/25/14 1030 04/25/14 1100  BP: 110/70 111/71 109/72 112/69  Pulse: 45 42 43   Temp:      TempSrc:      Resp: 20 28 27 24   Height:      Weight:      SpO2: 96% 98% 95% 96%   NAD HEENT WNL No JVD up Chest clear reduced bases RRBrady no r NABS, NT Ext warm without edema Neuro intact   I have reviewed all of today's lab results  CXR: bibasilar atx, effusion, some edema  IMPRESSION: Acute type B aortic dissection (involving L renal artery) Severe hypertension Hypertensive cardiomyopathy (Severe LVH, LVEF 50% by Echo 03/28/14) ATX Hypertensive CKD AKI - due to L renal artery dissection Mild hyperkalemia Thrombocytopenia (consumption?)  PLAN/REC: Avoiding nipride as likely would need over 24 hrs and CRI Advance labetalol in order to dc esmolol drip May need further escalation labetalol if unable to dc esmolol and HR tolerates Would prefer nicardipine of first then above ecg now With such brady, and Norvas to take 2 weeks to work well Would add hydralazine in future if unable to get off drips Low threshold lasix in future, holding today, unless hypoxia worsens Echo assessment needed Send HIV scd tsh Appreciate surgery following recs  Ccm time 30 min  Lavon Paganini. Titus Mould, MD, [place] Pgr: [facility]

## 2014-04-25 NOTE — Consult Note (Signed)
[facility]  Subjective  - arms and legs tingling   Objective 108/64 48 97.3 F (36.3 C) (Oral) 29 93%  Intake/Output Summary (Last 24 hours) at 04/25/14 0846 Last data filed at 04/25/14 0800  Gross per 24 hour  Intake 4542.29 ml  Output    695 ml  Net 3847.29 ml   Subjectively chest pain is improved 2+ radial and DP pulses bilaterally Abdomen soft less tender than yesterday Neuro alert and oriented agitated moves all extremities 5/5 motor sensation grossly intact to light touch  Assessment/Planning: Type B dissection overall chest pain improved hemodynamically stable although requiring large amount of IV BP control to keep him down.  HR blocked down well.  Repair will be significant operation with high mortality in acute phase so hopefully he can recover from this to consider operation in more chronic phase  Renal dysfunction multifactorial ATN from contrast and hypovolemia, possible decreased right kidney perfusion and left renal infarct in kidneys that most likely have some component of underlying hypertensive nephropathy.  - discussed with pt putting foley back in to monitor urine output which he hopefully will consent to  - hyperkalemia, acidosis, rising creatinine +/- urine output issues all relatively stable but will concentrate drips as he is now getting almost 150 cc/hr  -would probably benefit from nephrology consult as I can definitely see him headed toward dialysis unless things improve today  Chest pain- much improved less narcotic as the night progressed  Thrombocytopenia: ? Low grade DIC vs other etiology trend for now  [name] E 04/25/2014 8:46 AM --  Laboratory Lab Results:  Recent Labs  04/24/14 0245 04/25/14 0256  WBC 7.8 7.5  HGB 11.8* 11.6*  HCT 35.6* 35.1*  PLT 86* 82*   BMET  Recent Labs  04/24/14 2208 04/25/14 0256  NA 132* 135  K 4.7 4.8  CL 108 109  CO2 16* 15*  GLUCOSE 132* 128*  BUN 20  20  CREATININE 2.78* 2.85*  CALCIUM 8.0* 8.8    COAG Lab Results  Component Value Date   INR 1.11 04/24/2014   INR 1.05 03/27/2014   INR 1.18 05/23/2011   No results found for: PTT

## 2014-04-25 NOTE — Progress Notes (Signed)
Pt requested to [facility].  Assisted pt to [facility].  Pt has become disconnected from EKG monitoring and pt refused to let monitoring be reattached while he is sitting on BSC.  Pt advised and educated regarding the necessity for monitoring.  Pt continues to refuse.  Will continue to monitor and attempt to get monitoring equipment back on pt.

## 2014-04-25 NOTE — Clinical Documentation Improvement (Signed)
Presents with Aortic Dissection.    Mild Pulmonary Edema by CXR documented in 2/9 progress note  Transitioned from IV Esmolol and Nicardipine to PO labetalol and Amlodipine  Minimize IVF's  Monitor I&O  Please clarify the acuity of the patient's pulmonary edema - acute, chronic, acute on chronic; if it's related to CHF or not and document findings in next progress note and include in discharge summary if clinically significant.  Thank You, Zoila Shutter ,RN Clinical Documentation Specialist:  [place] Information Management

## 2014-04-25 NOTE — Progress Notes (Signed)
Pt order 30g Kayexakate PO.  Pt took 15g but refused to take the other 15g.

## 2014-04-25 NOTE — Progress Notes (Signed)
Pt unable to have BM.  Pt back in bed and back on monitor.  Pt stating foley bothering him and that's the reason he cannot have BM.  Pt has been messing with foley all throughout the night.  Pt has been advised that pulling or manipulating foley could cause irritation and increased discomfort.  Pt continues to complain about wanting foley removed.  Advised of the benefits of having foley in place, i.e. I & O monitoring.  Discussed foley and possible constipation issue with Dr. Jimmy Footman.  Orders received.  Will initiate and continue to monitor closely.

## 2014-04-25 NOTE — Clinical Documentation Improvement (Signed)
Presents with Aortic Dissection. Patient has gone into Acute Renal Failure due to dissection and CKD is documented.   Black male  GFR's ranging from 59 to 44 this admission  Please clarify the stage of CKD from the list below and document findings in next progress note and include in discharge summary.  _______CKD Stage I - GFR > OR = 90 _______CKD Stage II - GFR 60-80 _______CKD Stage III - GFR 30-59 _______CKD Stage IV - GFR 15-29 _______CKD Stage V - GFR < 15 _______ESRD (End Stage Renal Disease) _______Other condition_____________  Angela Adam ,RN Clinical Documentation Specialist:  [phone]  [facility]

## 2014-04-25 NOTE — Progress Notes (Addendum)
T[facility] DAILY ICU PROGRESS NOTE                   [address]            [place] [address]          [phone]        Total Length of Stay:  LOS: 2 days   Subjective: Patient with shortness of breath this am. Also, has discomfort over bladder area this am. Denies chest pain, but has tingling in his arms and legs  Objective: Vital signs in last 24 hours: Temp:  [97.3 F (36.3 C)-98.1 F (36.7 C)] 97.3 F (36.3 C) (02/10 0740) Pulse Rate:  [43-66] 48 (02/10 0830) Cardiac Rhythm:  [-] Sinus bradycardia (02/10 0000) Resp:  [12-29] 29 (02/10 0830) BP: (107-148)/(51-91) 108/64 mmHg (02/10 0830) SpO2:  [80 %-96 %] 93 % (02/10 0830) FiO2 (%):  [50 %-100 %] 100 % (02/10 0000) Weight:  [163 lb (73.936 kg)] 163 lb (73.936 kg) (02/10 0457)  Filed Weights   04/23/14 2125 04/24/14 0257 04/25/14 0457  Weight: 156 lb (70.761 kg) 154 lb 8.7 oz (70.1 kg) 163 lb (73.936 kg)   Weight change: 7 lb (3.175 kg)     Intake/Output from previous day: 02/09 0701 - 02/10 0700 In: 4406.1 [I.V.:3406.1; IV Piggyback:1000] Out: 695 [Urine:695]  Intake/Output this shift: Total I/O In: 156.2 [I.V.:156.2] Out: -   Current Meds: Scheduled Meds: . amLODipine  10 mg Oral Daily  . antiseptic oral rinse  7 mL Mouth Rinse q12n4p  . chlorhexidine  15 mL Mouth Rinse BID  . labetalol  300 mg Oral BID  . sodium chloride  1,000 mL Intravenous Once   Continuous Infusions: . sodium chloride 10 mL/hr at 04/24/14 1800  . esmolol 100 mcg/kg/min (04/25/14 0553)  . niCARDipine 12.5 mg/hr (04/25/14 0800)   PRN Meds:.sodium chloride, HYDROmorphone (DILAUDID) injection, labetalol, sorbitol  General appearance: alert, cooperative and mild distress Heart: Slightly bradycardic Lungs: Diminished at bases Abdomen: soft, mild diffuse tenderness ; bowel sounds normal; no masses,  no organomegaly Extremities: No LE edema. Pulses intact bilaterally  Lab Results: CBC: Recent Labs  04/24/14 0245  04/25/14 0256  WBC 7.8 7.5  HGB 11.8* 11.6*  HCT 35.6* 35.1*  PLT 86* 82*   BMET:  Recent Labs  04/24/14 2208 04/25/14 0256  NA 132* 135  K 4.7 4.8  CL 108 109  CO2 16* 15*  GLUCOSE 132* 128*  BUN 20 20  CREATININE 2.78* 2.85*  CALCIUM 8.0* 8.8    PT/INR:  Recent Labs  04/24/14 0415  LABPROT 14.4  INR 1.11   Radiology: Dg Chest Port 1 View  04/24/2014   CLINICAL DATA:  Known aortic dissection. Assess for underlying airspace disease. Subsequent encounter.  EXAM: PORTABLE CHEST - 1 VIEW  COMPARISON:  CTA of the chest performed 04/23/2014  FINDINGS: Bibasilar airspace opacities are noted, with underlying vascular congestion, concerning for mild pulmonary edema. This is mostly new from the prior CTA. No definite pleural effusion or pneumothorax is seen.  The cardiomediastinal silhouette is enlarged. No acute osseous abnormalities are identified.  IMPRESSION: 1. Bibasilar airspace opacities, with underlying vascular congestion, concerning for mild pulmonary edema, mostly new from the prior study. 2. Enlarged cardiomediastinal silhouette, as previously noted.   Electronically Signed   By: Garald Balding M.D.   On: 04/24/2014 07:03     Assessment/Plan: 1. 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 SB in  the low 50's this am. SBP 118 or less. On Noravsc 10 daily, Esmolol drip, and Cardene drip. Must continue strict management of BP. 2. History of CHF 3. History of CKD-left kidney is non salvageable. Creatinine this am 2.85 and he has decreasing UO. Dr. Oneida Alar discussed importance of having foley placed. Patient thinking about it. Likely also needs nephrology consult. 4. H and H stable at 11.6 and 35.1 5. Thrombocytopenia-platlets 82,000 6. Pulmonary-On a non re breather (6L) this am.CXR shows right basilar atelectasis, worsening aeration on left (possible infiltrate/effusion). Pulmonary to evaluate.   Nani Skillern PA-C 04/25/2014 8:34 AM  I have  seen and examined the patient and agree with the assessment and plan as outlined.  Remains hemodynamically stable w/ BP well controlled.  HR improved since esmolol stopped.  Oliguric.  Ramon Zanders H 04/25/2014 7:00 PM

## 2014-04-25 NOTE — Plan of Care (Signed)
Problem: Phase I Progression Outcomes Goal: Pain controlled with appropriate interventions Outcome: Progressing Pt requires pain medication often Goal: Voiding-avoid urinary catheter unless indicated Outcome: Completed/Met Date Met:  04/25/14 Pt had catheter in place but had been removed. Goal: Hemodynamically stable Outcome: Not Progressing Pt remains on esmolol and cardene to manage BP and HR

## 2014-04-26 ENCOUNTER — Inpatient Hospital Stay (HOSPITAL_COMMUNITY): Payer: Medicare Other

## 2014-04-26 DIAGNOSIS — J810 Acute pulmonary edema: Secondary | ICD-10-CM

## 2014-04-26 DIAGNOSIS — I7100 Dissection of unspecified site of aorta: Secondary | ICD-10-CM

## 2014-04-26 DIAGNOSIS — N179 Acute kidney failure, unspecified: Secondary | ICD-10-CM

## 2014-04-26 LAB — COMPREHENSIVE METABOLIC PANEL
ALK PHOS: 51 U/L (ref 39–117)
ALT: 20 U/L (ref 0–53)
AST: 22 U/L (ref 0–37)
Albumin: 3.1 g/dL — ABNORMAL LOW (ref 3.5–5.2)
Anion gap: 6 (ref 5–15)
BILIRUBIN TOTAL: 1 mg/dL (ref 0.3–1.2)
BUN: 29 mg/dL — ABNORMAL HIGH (ref 6–23)
CO2: 17 mmol/L — AB (ref 19–32)
Calcium: 8.8 mg/dL (ref 8.4–10.5)
Chloride: 111 mmol/L (ref 96–112)
Creatinine, Ser: 3.46 mg/dL — ABNORMAL HIGH (ref 0.50–1.35)
GFR, EST AFRICAN AMERICAN: 24 mL/min — AB (ref >90)
GFR, EST NON AFRICAN AMERICAN: 20 mL/min — AB (ref >90)
GLUCOSE: 121 mg/dL — AB (ref 70–99)
Potassium: 4.6 mmol/L (ref 3.5–5.1)
Sodium: 134 mmol/L — ABNORMAL LOW (ref 135–145)
Total Protein: 5.9 g/dL — ABNORMAL LOW (ref 6.0–8.3)

## 2014-04-26 LAB — CBC WITH DIFFERENTIAL/PLATELET
BASOS ABS: 0 10*3/uL (ref 0.0–0.1)
BASOS PCT: 0 % (ref 0–1)
Eosinophils Absolute: 0 10*3/uL (ref 0.0–0.7)
Eosinophils Relative: 0 % (ref 0–5)
HEMATOCRIT: 32.2 % — AB (ref 39.0–52.0)
HEMOGLOBIN: 10.8 g/dL — AB (ref 13.0–17.0)
LYMPHS PCT: 9 % — AB (ref 12–46)
Lymphs Abs: 0.7 10*3/uL (ref 0.7–4.0)
MCH: 30.5 pg (ref 26.0–34.0)
MCHC: 33.5 g/dL (ref 30.0–36.0)
MCV: 91 fL (ref 78.0–100.0)
MONOS PCT: 17 % — AB (ref 3–12)
Monocytes Absolute: 1.4 10*3/uL — ABNORMAL HIGH (ref 0.1–1.0)
NEUTROS ABS: 6.2 10*3/uL (ref 1.7–7.7)
Neutrophils Relative %: 75 % (ref 43–77)
Platelets: 88 10*3/uL — ABNORMAL LOW (ref 150–400)
RBC: 3.54 MIL/uL — ABNORMAL LOW (ref 4.22–5.81)
RDW: 16 % — AB (ref 11.5–15.5)
WBC: 8.3 10*3/uL (ref 4.0–10.5)

## 2014-04-26 LAB — HIV ANTIBODY (ROUTINE TESTING W REFLEX): HIV Screen 4th Generation wRfx: NONREACTIVE

## 2014-04-26 LAB — GLUCOSE, CAPILLARY: Glucose-Capillary: 123 mg/dL — ABNORMAL HIGH (ref 70–99)

## 2014-04-26 MED ORDER — HYDRALAZINE HCL 20 MG/ML IJ SOLN
10.0000 mg | INTRAMUSCULAR | Status: DC | PRN
Start: 2014-04-26 — End: 2014-05-03
  Administered 2014-04-27 – 2014-04-30 (×5): 20 mg via INTRAVENOUS
  Administered 2014-04-30: 40 mg via INTRAVENOUS
  Administered 2014-05-01 – 2014-05-03 (×5): 20 mg via INTRAVENOUS
  Filled 2014-04-26 (×3): qty 1
  Filled 2014-04-26: qty 2
  Filled 2014-04-26: qty 1
  Filled 2014-04-26: qty 2
  Filled 2014-04-26 (×4): qty 1

## 2014-04-26 MED ORDER — NITROGLYCERIN 0.4 MG/HR TD PT24
0.4000 mg | MEDICATED_PATCH | Freq: Every day | TRANSDERMAL | Status: DC
  Administered 2014-04-26 – 2014-05-03 (×8): 0.4 mg via TRANSDERMAL
  Filled 2014-04-26 (×8): qty 1

## 2014-04-26 MED ORDER — HEPARIN SODIUM (PORCINE) 1000 UNIT/ML IJ SOLN
2.8000 mL | INTRAMUSCULAR | Status: DC | PRN
  Administered 2014-04-26: 2800 [IU]
  Filled 2014-04-26: qty 2.8
  Filled 2014-04-26: qty 3

## 2014-04-26 NOTE — Progress Notes (Signed)
  Progress Note    04/26/2014 7:55 AM   Subjective:  Denies chest pain  afebrile [id number] systolic HR  [id number] NSR/SB 98% NRB  Gtt:  Nicardipine  Filed Vitals:   04/26/14 0730  BP: 115/60  Pulse: 57  Temp:   Resp: 23    Physical Exam: Cardiac:  regular Lungs:  Decreased at bases bilaterally Extremities:  2+ radial pulses bilaterally; 2+ DP pulses bilaterally Abdomen:  Non distended; decreased BS  CBC    Component Value Date/Time   WBC 8.3 04/26/2014 0345   RBC 3.54* 04/26/2014 0345   HGB 10.8* 04/26/2014 0345   HCT 32.2* 04/26/2014 0345   PLT 88* 04/26/2014 0345   MCV 91.0 04/26/2014 0345   MCH 30.5 04/26/2014 0345   MCHC 33.5 04/26/2014 0345   RDW 16.0* 04/26/2014 0345   LYMPHSABS 0.7 04/26/2014 0345   MONOABS 1.4* 04/26/2014 0345   EOSABS 0.0 04/26/2014 0345   BASOSABS 0.0 04/26/2014 0345    BMET    Component Value Date/Time   NA 134* 04/26/2014 0345   K 4.6 04/26/2014 0345   CL 111 04/26/2014 0345   CO2 17* 04/26/2014 0345   GLUCOSE 121* 04/26/2014 0345   BUN 29* 04/26/2014 0345   CREATININE 3.46* 04/26/2014 0345   CALCIUM 8.8 04/26/2014 0345   GFRNONAA 20* 04/26/2014 0345   GFRAA 24* 04/26/2014 0345    INR    Component Value Date/Time   INR 1.11 04/24/2014 0415     Intake/Output Summary (Last 24 hours) at 04/26/14 0755 Last data filed at 04/26/14 0700  Gross per 24 hour  Intake   2358 ml  Output    420 ml  Net   1938 ml     Assessment:  44 y.o. male  Type B dissection overall chest pain improved hemodynamically stable although requiring large amount of IV BP control to keep him down. HR blocked down well. Repair will be significant operation with high mortality in acute phase so hopefully he can recover from this to consider operation in more chronic phase   Plan: -BP controlled with Nicardipine and chest pain has improved -pt with ARF with left kidney infarct and creatinine worse today-needs stat renal consult.  I have  spoken to Dr. Donalynn Furlong will see the pt -pt on non re-breather with worsening kidney function with oligouria-most likely will require HD -thrombocytopenia slightly improved -H&H down slightly-continue to monitor   Leontine Locket, PA-C [facility] [phone] 04/26/2014 7:55 AM    I agree with the above.  The patient has a very complex type III aortic dissection with left renal infarct and worsening renal function likely secondary to ATN given contrast load for his CT scan as well as a significant decrease in his blood pressure secondary to the dissection and IV medication.  Nephrology has been consulted.  Patient's medical issues are being managed by cc home.  I do not think that his worsening renal function is an acute indication to proceed with operative repair given the complexity that is required to fix this dissection.  Most likely, the patient will require replacement of his ascending aorta followed by aortic arch de-branching as well as stent graft.  Hopefully the patient's pulmonary status will improve and once we are out of the acute period, repair of his dissection can proceed.  Annamarie Major

## 2014-04-26 NOTE — Progress Notes (Addendum)
T[facility] DAILY ICU PROGRESS NOTE                   [address]            [place] [address]          [phone]        Total Length of Stay:  LOS: 3 days   Subjective: Patient with continued shortness of breath. Denies chest pain, but has tingling in his arms and legs (has had since admission)  Objective: Vital signs in last 24 hours: Temp:  [97.3 F (36.3 C)-98 F (36.7 C)] 98 F (36.7 C) (02/11 0340) Pulse Rate:  [42-114] 57 (02/11 0730) Cardiac Rhythm:  [-] Sinus bradycardia (02/11 0600) Resp:  [15-41] 23 (02/11 0730) BP: (102-139)/(57-76) 115/60 mmHg (02/11 0730) SpO2:  [91 %-99 %] 99 % (02/11 0730) Weight:  [169 lb 5 oz (76.8 kg)] 169 lb 5 oz (76.8 kg) (02/11 0500)  Filed Weights   04/24/14 0257 04/25/14 0457 04/26/14 0500  Weight: 154 lb 8.7 oz (70.1 kg) 163 lb (73.936 kg) 169 lb 5 oz (76.8 kg)   Weight change: 6 lb 5 oz (2.864 kg)     Intake/Output from previous day: 02/10 0701 - 02/11 0700 In: 2358 [I.V.:2358] Out: 420 [Urine:420]  Intake/Output this shift:    Current Meds: Scheduled Meds: . amLODipine  10 mg Oral Daily  . antiseptic oral rinse  7 mL Mouth Rinse q12n4p  . chlorhexidine  15 mL Mouth Rinse BID  . hydrALAZINE  20 mg Intravenous Q4H  . labetalol  300 mg Oral BID  . sodium chloride  1,000 mL Intravenous Once   Continuous Infusions: . sodium chloride Stopped (04/25/14 0800)  . niCARDipine 20 mg/hr (04/26/14 0600)   PRN Meds:.sodium chloride, HYDROmorphone (DILAUDID) injection, labetalol, sorbitol  General appearance: alert, cooperative and mild distress Heart: Slightly bradycardic Lungs: Diminished at bases Abdomen: Soft, mild diffuse tenderness ; bowel sounds normal; no masses,  no organomegaly Extremities: No LE edema. Pulses intact bilaterally  Lab Results: CBC:  Recent Labs  04/25/14 0256 04/26/14 0345  WBC 7.5 8.3  HGB 11.6* 10.8*  HCT 35.1* 32.2*  PLT 82* 88*   BMET:   Recent Labs  04/25/14 0256  04/26/14 0345  NA 135 134*  K 4.8 4.6  CL 109 111  CO2 15* 17*  GLUCOSE 128* 121*  BUN 20 29*  CREATININE 2.85* 3.46*  CALCIUM 8.8 8.8    PT/INR:   Recent Labs  04/24/14 0415  LABPROT 14.4  INR 1.11   Radiology: Dg Chest Port 1 View  04/25/2014   CLINICAL DATA:  Hypoxia and respiratory failure  EXAM: PORTABLE CHEST - 1 VIEW  COMPARISON:  04/24/2014  FINDINGS: Cardiac shadow remains mildly enlarged. Mild right basilar atelectasis is again seen. Increasing left basilar infiltrate is noted with associated effusion. No acute bony abnormality is noted.  IMPRESSION: Increasing left basilar infiltrate with associated effusion when compared with the prior exam.   Electronically Signed   By: Inez Catalina M.D.   On: 04/25/2014 08:23    Assessment/Plan: 1. 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 Was on Esmolol drip yesterday. Had junctional rhythm so it was stopped. SB in the low 50's this am. SBP 118 or less. On Noravsc 10 daily and Cardene drip. Must continue strict management of BP. 2. History of CHF 3. History of CKD-left kidney is non salvageable. Creatinine much worse this am and is  up to 3.46 and he has decreasing UO. Foley inserted yesterday. Nephrology to evaluate this am. 4. H and H stable at 10.8 and 32.2 5. Thrombocytopenia-platlets 88,000 6. Pulmonary-Still on a non re breather.CXR shows bilateral pleural effusions, increasing bibasilar atelectasis (worse on the right) Pulmonary to evaluate.   Nani Skillern PA-C 04/26/2014 8:24 AM   I have seen and examined the patient and agree with the assessment and plan as outlined.  BP well controlled.  Wasn't seen by Nephrology yesterday - consult pending.  OWEN,CLARENCE H 04/26/2014

## 2014-04-26 NOTE — Consult Note (Signed)
Reason for Consult: AKI Referring Physician: CTS  HPI: [name] is an 44 y.o. male with PMH malignant HTN w/ multiple cases of Hypertensive emergency, CKD, EtOH abuse, tobacco abuse, marijuana abuse, and systolic CHF.   Admitted on 04/23/2014 for chest pain. 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   Patient presented to [facility] ED with sharp, stabbing chest pain that radiating to his back. He was hypertensive in the ED with SBP ~190's. CTA chest found a Type B aortic dissection. He has a greater than 15 year history of uncontrolled HTN. He has never had any problems with his kidneys before. He denies any dysuria or changes in his bowels. He des endorse shortness of breath.    Trend in Creatinine: CREATININE, SER  Date/Time Value Ref Range Status  04/26/2014 03:45 AM 3.46* 0.50 - 1.35 mg/dL Final  04/25/2014 02:56 AM 2.85* 0.50 - 1.35 mg/dL Final  04/24/2014 10:08 PM 2.78* 0.50 - 1.35 mg/dL Final  04/24/2014 05:21 PM 2.75* 0.50 - 1.35 mg/dL Final  04/24/2014 10:54 AM 2.57* 0.50 - 1.35 mg/dL Final  04/24/2014 02:45 AM 2.07* 0.50 - 1.35 mg/dL Final  04/23/2014 09:37 PM 1.90* 0.50 - 1.35 mg/dL Final  04/23/2014 09:36 PM 2.05* 0.50 - 1.35 mg/dL Final  03/29/2014 04:29 AM 1.65* 0.50 - 1.35 mg/dL Final  03/28/2014 04:38 AM 1.69* 0.50 - 1.35 mg/dL Final  03/27/2014 11:16 PM 1.80* 0.50 - 1.35 mg/dL Final  03/27/2014 01:36 PM 1.70* 0.50 - 1.35 mg/dL Final  02/23/2014 06:25 AM 1.41* 0.50 - 1.35 mg/dL Final  09/12/2013 04:10 AM 1.53* 0.50 - 1.35 mg/dL Final  09/11/2013 02:42 PM 1.34 0.50 - 1.35 mg/dL Final  09/11/2013 10:20 AM 1.61* 0.50 - 1.35 mg/dL Final  08/09/2013 02:38 AM 1.34 0.50 - 1.35 mg/dL Final  08/08/2013 11:45 AM 1.53* 0.50 - 1.35 mg/dL Final  05/06/2013 03:43 AM 1.52* 0.50 - 1.35 mg/dL Final  05/05/2013 03:06 PM 1.48* 0.50 - 1.35 mg/dL Final  03/04/2013 03:28 PM 1.87* 0.50 - 1.35 mg/dL Final  03/04/2013 03:16 PM 2.00* 0.50 - 1.35 mg/dL Final  02/22/2013 04:39 AM 1.49* 0.50 -  1.35 mg/dL Final  02/21/2013 03:40 AM 1.46* 0.50 - 1.35 mg/dL Final  02/20/2013 04:50 PM 1.77* 0.50 - 1.35 mg/dL Final  02/20/2013 11:30 AM 1.53* 0.50 - 1.35 mg/dL Final  11/27/2012 03:48 PM 1.53* 0.50 - 1.35 mg/dL Final  09/21/2012 02:58 PM 1.4 0.4 - 1.5 mg/dL Final  09/19/2012 02:18 PM 1.37* 0.50 - 1.35 mg/dL Final  01/27/2012 09:17 AM 1.62* 0.50 - 1.35 mg/dL Final  01/26/2012 04:52 AM 1.32 0.50 - 1.35 mg/dL Final  01/24/2012 08:39 PM 1.46* 0.50 - 1.35 mg/dL Final  01/24/2012 03:16 PM 1.47* 0.50 - 1.35 mg/dL Final  10/20/2011 11:03 AM 1.41* 0.50 - 1.35 mg/dL Final  07/17/2011 09:57 AM 1.5 0.4 - 1.5 mg/dL Final  06/02/2011 02:48 PM 1.7* 0.4 - 1.5 mg/dL Final  05/25/2011 05:05 AM 1.68* 0.50 - 1.35 mg/dL Final  05/24/2011 10:49 AM 1.68* 0.50 - 1.35 mg/dL Final  05/24/2011 05:44 AM 1.75* 0.50 - 1.35 mg/dL Final  05/23/2011 05:00 AM 1.90* 0.50 - 1.35 mg/dL Final  05/22/2011 04:53 AM 1.57* 0.50 - 1.35 mg/dL Final  09/11/2010 03:50 AM 1.30 0.50 - 1.35 mg/dL Final    Comment:    **Please note change in reference range.**  09/10/2010 04:45 AM 1.41* 0.50 - 1.35 mg/dL Final    Comment:    **Please note change in reference range.**  09/09/2010 07:30 AM 1.01 0.50 -  1.35 mg/dL Final    Comment:    **Please note change in reference range.**  09/08/2010 06:43 PM 1.14 0.50 - 1.35 mg/dL Final    Comment:    **Please note change in reference range.**  03/23/2010 09:39 AM 1.13 0.4 - 1.5 mg/dL Final  02/04/2010 10:48 AM 1.31 0.4 - 1.5 mg/dL Final  08/21/2009 06:34 AM 1.02 0.4 - 1.5 mg/dL Final  08/21/2009 03:40 AM 1.03 0.4 - 1.5 mg/dL Final  08/20/2009 04:05 PM 1.0 0.4 - 1.5 mg/dL Final  08/11/2009 08:16 PM 1.15 0.4 - 1.5 mg/dL Final  02/28/2009 05:35 AM 0.96 0.4 - 1.5 mg/dL Final    PMH:   Past Medical History  Diagnosis Date  . Hypertension     a. Hx of HTN urgency secondary to noncompliance. b. urinary metanephrine and catecholeamine levels normal 2013.  [name] Anxiety   . GERD  (gastroesophageal reflux disease)   . Acute renal failure     June 2012 felt secondary to Toradol  . Chronic renal failure   . CHF (congestive heart failure)     a. 05/2011: Adm with pulm edema/HTN urgency, EF 35-40% with diffuse hypokinesis and moderate to severe mitral regurgitation. Cardiomyopathy likely due to uncontrolled HTN and ETOH abuse - cath deferred due to renal insufficiency (felt due to uncontrolled HTN). bJodie Echevaria MV 06/2011: EF 37% and no ischemia or infarction. c. EF 45-50% by echo 01/2012.  . Cardiomyopathy   . HYPERLIPIDEMIA   . INGUINAL HERNIA   . ETOH abuse     a. Reported to have quit 05/2011.  . Valvular heart disease     a. Echo 05/2011: moderate to severe eccentric MR and mild to moderate AI with prolapsing left coronary cusp. b. Echo 01/2012: mild-mod AI, mild dilitation of aortic root, mild MR.  . CKD (chronic kidney disease)     a. 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  . Tobacco abuse   . Pneumonia ~ 2013  . Headache(784.0)     "q other day" (08/08/2013)  . Migraine     "twice/month" (08/08/2013)  . Chronic sinusitis   . History of medication noncompliance   . Chronic abdominal pain   . DDD (degenerative disc disease), lumbar     PSH:   Past Surgical History  Procedure Laterality Date  . Ankle surgery      Fractures bilaterally  . Inguinal hernia repair Right ~ 1996  . Foot fracture surgery Bilateral 2004-2010    "got pins in both of them"    Allergies:  Allergies  Allergen Reactions  . Imdur [Isosorbide] Other (See Comments)    headache    Medications:   Prior to Admission medications   Medication Sig Start Date End Date Taking? Authorizing Provider  amLODipine (NORVASC) 10 MG tablet Take 1 tablet (10 mg total) by mouth daily. 12/04/13  Yes Larey Dresser, MD  carvedilol (COREG) 25 MG tablet Take 1 tablet (25 mg total) by mouth 2 (two) times daily. 12/04/13  Yes Larey Dresser, MD  cyclobenzaprine (FLEXERIL) 5 MG tablet Take 1 tablet (5 mg total) by  mouth 3 (three) times daily as needed for muscle spasms (please give 1st dose now). 05/06/13  Yes Waldemar Dickens, MD  furosemide (LASIX) 20 MG tablet Take 1 tablet (20 mg total) by mouth daily as needed for fluid or edema (shortness of breath). 09/12/13  Yes Ejiroghene E Emokpae, MD  hydrALAZINE (APRESOLINE) 50 MG tablet 1 and 1/2 tablets (75mg ) three times a day Patient taking differently:  Take 75 mg by mouth See admin instructions. 1 and 1/2 tablets (75mg ) three times a day 12/04/13  Yes Larey Dresser, MD  HYDROcodone-acetaminophen (NORCO/VICODIN) 5-325 MG per tablet Take 1 tablet by mouth every 6 (six) hours as needed for moderate pain or severe pain. 02/23/14  Yes Harvie Heck, PA-C  lisinopril (PRINIVIL,ZESTRIL) 20 MG tablet Take 1 tablet (20 mg total) by mouth daily. 12/04/13  Yes Larey Dresser, MD  spironolactone (ALDACTONE) 25 MG tablet Take 1 tablet (25 mg total) by mouth daily. 12/04/13  Yes Larey Dresser, MD    Inpatient medications: . amLODipine  10 mg Oral Daily  . antiseptic oral rinse  7 mL Mouth Rinse q12n4p  . chlorhexidine  15 mL Mouth Rinse BID  . hydrALAZINE  20 mg Intravenous Q4H  . labetalol  300 mg Oral BID  . nitroGLYCERIN  0.4 mg Transdermal Daily  . sodium chloride  1,000 mL Intravenous Once    Discontinued Meds:   Medications Discontinued During This Encounter  Medication Reason  . nicardipine (CARDENE) 20mg  in 0.86% saline 257ml IV infusion (0.1 mg/ml)   . morphine 2 MG/ML injection 2 mg   . morphine 2 MG/ML injection 2-6 mg   . pantoprazole (PROTONIX) injection 40 mg   . labetalol (NORMODYNE,TRANDATE) injection 20-60 mg   . antiseptic oral rinse (CPC / CETYLPYRIDINIUM CHLORIDE 0.05%) solution 7 mL Duplicate  . nicardipine (CARDENE) 20mg  in 0.86% saline 241ml IV infusion (0.1 mg/ml) Duplicate  . esmolol (BREVIBLOC) 2000 mg / 100 mL (20 mg/mL) infusion   . hydrALAZINE (APRESOLINE) injection 10 mg   . labetalol (NORMODYNE,TRANDATE) injection 20 mg      Social History:  reports that he has been smoking Cigarettes.  He has a 5.75 pack-year smoking history. He has quit using smokeless tobacco. His smokeless tobacco use included Chew. He reports that he drinks alcohol. He reports that he uses illicit drugs (Marijuana).  Family History:   Family History  Problem Relation Age of Onset  . Hypertension    . Colon cancer Paternal Uncle     Pertinent items are noted in HPI. Weight change: 6 lb 5 oz (2.864 kg)  Intake/Output Summary (Last 24 hours) at 04/26/14 1129 Last data filed at 04/26/14 [id number]  Gross per 24 hour  Intake 2062.25 ml  Output    385 ml  Net 1677.25 ml   BP 117/64 mmHg  Pulse 57  Temp(Src) 98 F (36.7 C) (Axillary)  Resp 23  Ht 6' (1.829 m)  Wt 169 lb 5 oz (76.8 kg)  BMI 22.96 kg/m2  SpO2 99% Filed Vitals:   04/26/14 0630 04/26/14 0700 04/26/14 0730 04/26/14 1115  BP: 114/62 117/59 115/60 117/64  Pulse: 57 57 57   Temp:   98 F (36.7 C)   TempSrc:   Axillary   Resp: 22 25 23 23   Height:      Weight:      SpO2: 99% 98% 99%      Gen: NAD, alert, venti mask in place  HEENT: NCAT, clear conjunctiva, CV: bradycardic, S1/S2,  Resp: diminished at bases on venti mask  Abd: SNTND, BS present, no guarding or organomegaly Skin: no rashes, normal turgor  Neuro: no gross deficits.   Labs: Basic Metabolic Panel:  Recent Labs Lab 04/23/14 2136 04/23/14 2137 04/24/14 0245 04/24/14 1054 04/24/14 1721 04/24/14 2208 04/25/14 0256 04/26/14 0345  NA 135 141 138 135 132* 132* 135 134*  K 3.1* 3.0* 3.9 5.4* 5.8* 4.7 4.8  4.6  CL 105 105 108 108 107 108 109 111  CO2 22  --  22 19 13* 16* 15* 17*  GLUCOSE 103* 96 119* 108* 121* 132* 128* 121*  BUN 17 17 18 19 22 20 20  29*  CREATININE 2.05* 1.90* 2.07* 2.57* 2.75* 2.78* 2.85* 3.46*  ALBUMIN 3.8  --   --   --   --   --   --  3.1*  CALCIUM 8.7  --  9.0 8.7 8.6 8.0* 8.8 8.8   Liver Function Tests:  Recent Labs Lab 04/23/14 2136 04/26/14 0345  AST 20 22   ALT 12 20  ALKPHOS 71 51  BILITOT 0.9 1.0  PROT 6.6 5.9*  ALBUMIN 3.8 3.1*    Recent Labs Lab 04/23/14 2136  LIPASE 23   No results for input(s): AMMONIA in the last 168 hours. CBC:  Recent Labs Lab 04/23/14 2136 04/23/14 2137 04/24/14 0245 04/25/14 0256 04/26/14 0345  WBC 7.4  --  7.8 7.5 8.3  NEUTROABS 3.9  --   --   --  6.2  HGB 11.7* 12.9* 11.8* 11.6* 10.8*  HCT 34.4* 38.0* 35.6* 35.1* 32.2*  MCV 88.7  --  89.7 90.7 91.0  PLT 81*  --  86* 82* 88*   PT/INR: @LABRCNTIP (inr:5) Cardiac Enzymes: )No results for input(s): CKTOTAL, CKMB, CKMBINDEX, TROPONINI in the last 168 hours. CBG: No results for input(s): GLUCAP in the last 168 hours.  Iron Studies: No results for input(s): IRON, TIBC, TRANSFERRIN, FERRITIN in the last 168 hours.  Xrays/Other Studies: Dg Chest Port 1 View  04/26/2014   CLINICAL DATA:  Thoracic aortic dissection, pulmonary edema.  EXAM: PORTABLE CHEST - 1 VIEW  COMPARISON:  Portable chest x-ray of April 25, 2014  FINDINGS: The lungs are adequately inflated. Increasing density at the right lung base is consistent with atelectasis. A small right pleural effusion has developed. On the left there is persistent lower lobe atelectasis. A small left pleural effusion is suspected as well. The cardiac silhouette is enlarged. The central pulmonary vascularity is prominent but there is no definite cephalization. The mediastinum is normal in width.  IMPRESSION: Increasing bibasilar atelectasis and/or pneumonia. Small bilateral pleural effusions are present.   Electronically Signed   By: David  Martinique   On: 04/26/2014 07:40   Dg Chest Port 1 View  04/25/2014   CLINICAL DATA:  Hypoxia and respiratory failure  EXAM: PORTABLE CHEST - 1 VIEW  COMPARISON:  04/24/2014  FINDINGS: Cardiac shadow remains mildly enlarged. Mild right basilar atelectasis is again seen. Increasing left basilar infiltrate is noted with associated effusion. No acute bony abnormality is noted.   IMPRESSION: Increasing left basilar infiltrate with associated effusion when compared with the prior exam.   Electronically Signed   By: Inez Catalina M.D.   On: 04/25/2014 08:23    Assessment/Plan: 1. Acute on CKD III: Scr baseline 1.5 and an uptrending Scr to 3.46. Most likely 2/2 contrast that he received on admission. Complicated recovery as left kidney is nonfunctioning from infarction. He doesn't appear fluid overloaded but CXR showing small b/l pleural effusions. HD catheter to be placed. Will most likely need dialysis in the near future.  2. Acute infarction of left kidney  3. Type B Aortic Dissection:  4. HTN emergency  5. Systolic CHF  6. Thrombocytopenia    Rosemarie Ax 04/26/2014, 11:29 AM

## 2014-04-26 NOTE — Progress Notes (Signed)
CT surgery p.m. Rounds  Patient resting comfortably, blood pressure well controlled O2 sat 95% on Ventimask Sinus rhythm HD catheter placed successfully by CCM in left IJ

## 2014-04-26 NOTE — Procedures (Signed)
Hemodialysis Catheter Insertion Procedure Note Andre LOFFREDO [id number] 1970-04-04  Procedure: Insertion of Hemodialysis Catheter Indications: Hemodialysis  Procedure Details Consent: Risks of procedure as well as the alternatives and risks of each were explained to the (patient/caregiver).  Consent for procedure obtained. Time Out: Verified patient identification, verified procedure, site/side was marked, verified correct patient position, special equipment/implants available, medications/allergies/relevent history reviewed, required imaging and test results available.  Performed  Maximum sterile technique was used including antiseptics, cap, gloves, gown, hand hygiene, mask and sheet. Skin prep: Chlorhexidine; local anesthetic administered A antimicrobial bonded/coated triple lumen catheter was placed in the left internal jugular vein using the Seldinger technique.  Evaluation Blood flow good Complications: No apparent complications Patient did tolerate procedure well. Chest X-ray ordered to verify placement.  CXR: pending.  Procedure performed under direct ultrasound guidance for real time vessel cannulation.      [name], [facility] Pgr: [phone]  or [phone] 04/26/2014, 3:02 PM   I was present for and supervised the entire procedure  Merton Border, MD ; [facility] service Mobile [phone].  After 5:30 PM or weekends, call [phone]

## 2014-04-26 NOTE — Progress Notes (Signed)
Remains on nicardipine gtt Off esmolol 2/10. On PO labetalol C/O dyspnea. Oligo-anuric  Filed Vitals:   04/26/14 1415 04/26/14 1430 04/26/14 1445 04/26/14 1500  BP: 102/56 103/57 107/59 113/58  Pulse: 50 49 50 51  Temp:      TempSrc:      Resp: 30 30 33 29  Height:      Weight:      SpO2: 95% 95% 96% 94%   NAD HEENT WNL No JVD noted Chest bibasilar crackles, bronchial BS in R>L bases RRR, no M noted NABS, NT Ext warm with trace edema Neuro intact   I have reviewed all of today's lab results. Relevant abnormalities are discussed in the A/P section  CXR: CM, edema pattern, B basilar atx/effusions  IMPRESSION: Acute type B aortic dissection (involving L renal artery) Severe hypertension - now controlled Hypertensive cardiomyopathy (Severe LVH, LVEF 50% by Echo 03/28/14) Increasing pulm edema by CXR  Increasing dyspnea Hypertensive CKD AKI - due to L renal artery dissection Mild NAG acidosis Mild hyperkalemia  PLAN/REC: Cont labetalol PRN hydralazine Cont to try to transition from nicardipine gtt to amlodipine Cont supplemental O2 PRN BiPAP 2/11 Follow CXR intermittently Renal consult 2/11 - I believe he needs HD Minimize IVFs Monitor BMET intermittently Monitor I/Os Correct electrolytes as indicated Advance diet as tolerated  Merton Border, MD ; [facility] service Mobile [phone].  After 5:30 PM or weekends, call [phone]

## 2014-04-26 NOTE — Progress Notes (Signed)
Respiratory therapy note-Attempted to place Bipap, not tolerated, patient tore it off, stated he could not breath. Placed NRB 100% back on sp02 now 99%. RN aware, continue to monitor.

## 2014-04-27 DIAGNOSIS — I7103 Dissection of thoracoabdominal aorta: Principal | ICD-10-CM

## 2014-04-27 LAB — BASIC METABOLIC PANEL
ANION GAP: 10 (ref 5–15)
BUN: 36 mg/dL — ABNORMAL HIGH (ref 6–23)
CO2: 14 mmol/L — ABNORMAL LOW (ref 19–32)
Calcium: 8.8 mg/dL (ref 8.4–10.5)
Chloride: 110 mmol/L (ref 96–112)
Creatinine, Ser: 3.4 mg/dL — ABNORMAL HIGH (ref 0.50–1.35)
GFR calc Af Amer: 24 mL/min — ABNORMAL LOW (ref >90)
GFR, EST NON AFRICAN AMERICAN: 21 mL/min — AB (ref >90)
Glucose, Bld: 105 mg/dL — ABNORMAL HIGH (ref 70–99)
Potassium: 4.2 mmol/L (ref 3.5–5.1)
SODIUM: 134 mmol/L — AB (ref 135–145)

## 2014-04-27 LAB — CBC
HEMATOCRIT: 28.5 % — AB (ref 39.0–52.0)
HEMOGLOBIN: 9.6 g/dL — AB (ref 13.0–17.0)
MCH: 29.8 pg (ref 26.0–34.0)
MCHC: 33.7 g/dL (ref 30.0–36.0)
MCV: 88.5 fL (ref 78.0–100.0)
PLATELETS: 96 10*3/uL — AB (ref 150–400)
RBC: 3.22 MIL/uL — ABNORMAL LOW (ref 4.22–5.81)
RDW: 16.3 % — AB (ref 11.5–15.5)
WBC: 8.4 10*3/uL (ref 4.0–10.5)

## 2014-04-27 MED ORDER — FLUTICASONE PROPIONATE 50 MCG/ACT NA SUSP
1.0000 | Freq: Two times a day (BID) | NASAL | Status: DC
  Administered 2014-04-27 – 2014-05-03 (×11): 1 via NASAL
  Filled 2014-04-27: qty 16

## 2014-04-27 MED ORDER — FUROSEMIDE 10 MG/ML IJ SOLN
80.0000 mg | Freq: Once | INTRAMUSCULAR | Status: AC
  Administered 2014-04-27: 80 mg via INTRAVENOUS
  Filled 2014-04-27: qty 8

## 2014-04-27 MED ORDER — HYDRALAZINE HCL 50 MG PO TABS
50.0000 mg | ORAL_TABLET | Freq: Four times a day (QID) | ORAL | Status: DC
  Administered 2014-04-27 – 2014-04-28 (×4): 50 mg via ORAL
  Filled 2014-04-27 (×10): qty 1

## 2014-04-27 MED ORDER — OXYMETAZOLINE HCL 0.05 % NA SOLN
1.0000 | Freq: Two times a day (BID) | NASAL | Status: DC
  Administered 2014-04-27 – 2014-04-29 (×4): 1 via NASAL
  Filled 2014-04-27: qty 15

## 2014-04-27 NOTE — Progress Notes (Signed)
Patient ID: [name], male    DOB: Oct 23, 1970, 44 y.o.   MRN: [id number] Nephrology Progress Note  S: Still breathing with NRB  O:BP 156/83 mmHg  Pulse 89  Temp(Src) 97.6 F (36.4 C) (Axillary)  Resp 33  Ht 6' (1.829 m)  Wt 172 lb 6.4 oz (78.2 kg)  BMI 23.38 kg/m2  SpO2 96%  Intake/Output Summary (Last 24 hours) at 04/27/14 0946 Last data filed at 04/27/14 0900  Gross per 24 hour  Intake    985 ml  Output    553 ml  Net    432 ml   Intake/Output: I/O last 3 completed shifts: In: 2325 [P.O.:360; I.V.:1965] Out: [id number] [Urine:915]  Intake/Output this shift:  Total I/O In: 60 [P.O.:60] Out: -  Weight change: 3 lb 1.4 oz (1.4 kg) [id number] and awake, still appears anxious  CVS:RRR, S1S2 Resp:decreased BS at bases b/l  [id number], NTND  IP:[id number], moves all extremities freely    Recent Labs Lab 04/23/14 2136  04/24/14 0245 04/24/14 1054 04/24/14 1721 04/24/14 2208 04/25/14 0256 04/26/14 0345 04/27/14 0318  NA 135  < > 138 135 132* 132* 135 134* 134*  K 3.1*  < > 3.9 5.4* 5.8* 4.7 4.8 4.6 4.2  CL 105  < > 108 108 107 108 109 111 110  CO2 22  --  22 19 13* 16* 15* 17* 14*  GLUCOSE 103*  < > 119* 108* 121* 132* 128* 121* 105*  BUN 17  < > 18 19 22 20 20  29* 36*  CREATININE 2.05*  < > 2.07* 2.57* 2.75* 2.78* 2.85* 3.46* 3.40*  ALBUMIN 3.8  --   --   --   --   --   --  3.1*  --   CALCIUM 8.7  --  9.0 8.7 8.6 8.0* 8.8 8.8 8.8  AST 20  --   --   --   --   --   --  22  --   ALT 12  --   --   --   --   --   --  20  --   < > = values in this interval not displayed. Liver Function Tests:  Recent Labs Lab 04/23/14 2136 04/26/14 0345  AST 20 22  ALT 12 20  ALKPHOS 71 51  BILITOT 0.9 1.0  PROT 6.6 5.9*  ALBUMIN 3.8 3.1*    Recent Labs Lab 04/23/14 2136  LIPASE 23   No results for input(s): AMMONIA in the last 168 hours. CBC:  Recent Labs Lab 04/23/14 2136  04/24/14 0245 04/25/14 0256 04/26/14 0345 04/27/14 0318  WBC 7.4  --  7.8 7.5 8.3 8.4   NEUTROABS 3.9  --   --   --  6.2  --   HGB 11.7*  < > 11.8* 11.6* 10.8* 9.6*  HCT 34.4*  < > 35.6* 35.1* 32.2* 28.5*  MCV 88.7  --  89.7 90.7 91.0 88.5  PLT 81*  --  86* 82* 88* 96*  < > = values in this interval not displayed. Cardiac Enzymes: No results for input(s): CKTOTAL, CKMB, CKMBINDEX, TROPONINI in the last 168 hours. CBG:  Recent Labs Lab 04/26/14 1149  GLUCAP 123*    Iron Studies: No results for input(s): IRON, TIBC, TRANSFERRIN, FERRITIN in the last 72 hours. Studies/Results: Dg Chest Port 1 View  04/27/2014   CLINICAL DATA:  Respiratory failure.  Central line placement.  EXAM: PORTABLE CHEST - 1 VIEW  COMPARISON:  04/26/2014.  FINDINGS: Left IJ line noted with tip projected over superior vena cava. B stone hilar structures are stable. Stable cardiomegaly. Diffuse progressive bilateral pulmonary alveolar infiltrates. These could be related to pulmonary edema or pneumonia. Bilateral effusions are present. No pneumothorax.  IMPRESSION: 1. Left IJ line with tip projected over superior vena cava. 2. Progressive bilateral pulmonary alveolar infiltrates consistent with pulmonary edema and/or pneumonia. Associated bilateral effusions. 3. Cardiomegaly   Electronically Signed   By: Marcello Moores  Register   On: 04/27/2014 07:15   Dg Chest Port 1 View  04/26/2014   CLINICAL DATA:  Thoracic aortic dissection, pulmonary edema.  EXAM: PORTABLE CHEST - 1 VIEW  COMPARISON:  Portable chest x-ray of April 25, 2014  FINDINGS: The lungs are adequately inflated. Increasing density at the right lung base is consistent with atelectasis. A small right pleural effusion has developed. On the left there is persistent lower lobe atelectasis. A small left pleural effusion is suspected as well. The cardiac silhouette is enlarged. The central pulmonary vascularity is prominent but there is no definite cephalization. The mediastinum is normal in width.  IMPRESSION: Increasing bibasilar atelectasis and/or pneumonia.  Small bilateral pleural effusions are present.   Electronically Signed   By: David  Martinique   On: 04/26/2014 07:40   . amLODipine  10 mg Oral Daily  . antiseptic oral rinse  7 mL Mouth Rinse q12n4p  . chlorhexidine  15 mL Mouth Rinse BID  . hydrALAZINE  20 mg Intravenous Q4H  . labetalol  300 mg Oral BID  . nitroGLYCERIN  0.4 mg Transdermal Daily  . sodium chloride  1,000 mL Intravenous Once    BMET    Component Value Date/Time   NA 134* 04/27/2014 0318   K 4.2 04/27/2014 0318   CL 110 04/27/2014 0318   CO2 14* 04/27/2014 0318   GLUCOSE 105* 04/27/2014 0318   BUN 36* 04/27/2014 0318   CREATININE 3.40* 04/27/2014 0318   CALCIUM 8.8 04/27/2014 0318   GFRNONAA 21* 04/27/2014 0318   GFRAA 24* 04/27/2014 0318   CBC    Component Value Date/Time   WBC 8.4 04/27/2014 0318   RBC 3.22* 04/27/2014 0318   HGB 9.6* 04/27/2014 0318   HCT 28.5* 04/27/2014 0318   PLT 96* 04/27/2014 0318   MCV 88.5 04/27/2014 0318   MCH 29.8 04/27/2014 0318   MCHC 33.7 04/27/2014 0318   RDW 16.3* 04/27/2014 0318   LYMPHSABS 0.7 04/26/2014 0345   MONOABS 1.4* 04/26/2014 0345   EOSABS 0.0 04/26/2014 0345   BASOSABS 0.0 04/26/2014 0345    Assessment/Plan: [name] 44 y.o. male with PMH malignant HTN w/ multiple cases of Hypertensive emergency, CKD, EtOH abuse, tobacco abuse, marijuana abuse, and systolic CHF. ; admitted [id number] for chest pain and found to have type B aortic dissection.    1. Acute on CKD III: Scr baseline 2. Slight improvement in Scr today. With some improvement of UOP. HD cath placed yesterday. Will hold off on HD for now. Strict I/O's and limit fluids.  2. Acute infarction of left kidney  3. Type B aortic dissection  4. HTN emergency  5. Shortness of breath  6. Systolic CHF  7. Thrombocytopenia   Rosemarie Ax 9:46 AM

## 2014-04-27 NOTE — Progress Notes (Signed)
  Progress Note    04/27/2014 8:08 AM Hospital Day 4  Subjective:  Denies CP as well as numbness/tingling in his legs  Afebrile HR 50's-80's  99991111 systolic (123456 & [id number] this am) 97% RA  Gtts:  Nicardipine off  Filed Vitals:   04/27/14 0738  BP:   Pulse:   Temp: 97.6 F (36.4 C)  Resp:     Physical Exam: Cardiac:  regular Lungs:  Decreased BS at bases bilaterally Abdomen:  Soft, NT/ND Extremities:  2+ palpable DP pulses bilaterally; 2+ radial pulses bilaterally  CBC    Component Value Date/Time   WBC 8.4 04/27/2014 0318   RBC 3.22* 04/27/2014 0318   HGB 9.6* 04/27/2014 0318   HCT 28.5* 04/27/2014 0318   PLT 96* 04/27/2014 0318   MCV 88.5 04/27/2014 0318   MCH 29.8 04/27/2014 0318   MCHC 33.7 04/27/2014 0318   RDW 16.3* 04/27/2014 0318   LYMPHSABS 0.7 04/26/2014 0345   MONOABS 1.4* 04/26/2014 0345   EOSABS 0.0 04/26/2014 0345   BASOSABS 0.0 04/26/2014 0345    BMET    Component Value Date/Time   NA 134* 04/27/2014 0318   K 4.2 04/27/2014 0318   CL 110 04/27/2014 0318   CO2 14* 04/27/2014 0318   GLUCOSE 105* 04/27/2014 0318   BUN 36* 04/27/2014 0318   CREATININE 3.40* 04/27/2014 0318   CALCIUM 8.8 04/27/2014 0318   GFRNONAA 21* 04/27/2014 0318   GFRAA 24* 04/27/2014 0318    INR    Component Value Date/Time   INR 1.11 04/24/2014 0415     Intake/Output Summary (Last 24 hours) at 04/27/14 0808 Last data filed at 04/27/14 0700  Gross per 24 hour  Intake   1025 ml  Output    584 ml  Net    441 ml     Assessment/Plan:  44 y.o. male with acute type III aortic dissection with acute left kidney infarct Hospital Day 4  -BP up this am at 0000000 systolic-hydralazine 40mg  IV given now and 20mg  earlier this am at [place]. -continues to need NRB -ARF with creatinine at 3.40 with oligouria this am-temporary HD cath placed yesterday-renal following; HD per renal -thrombocytopenia improving at 96k this am -hgb trending downward-continue to  monitor   Leontine Locket, PA-C [facility] [phone] 04/27/2014 8:08 AM  Agree with above Cr starting to trend down.  No HD yet, but catheter placed Back on drip for BP control No acute indication for surgical repair.  Will likely need repair in immediate future, once acute issues are resolved   Annamarie Major

## 2014-04-27 NOTE — Progress Notes (Addendum)
TCTS DAILY ICU PROGRESS NOTE                   [address]            [place] [address]          [phone]        Total Length of Stay:  LOS: 4 days   Subjective: Denies chest pain, back pain, and states does not have tingling of arms and legs this am.  Objective: Vital signs in last 24 hours: Temp:  [96.8 F (36 C)-98.6 F (37 C)] 97.6 F (36.4 C) (02/12 0738) Pulse Rate:  [48-84] 84 (02/12 0700) Cardiac Rhythm:  [-] Normal sinus rhythm (02/12 0400) Resp:  [15-35] 22 (02/12 0700) BP: (90-152)/(39-102) 147/73 mmHg (02/12 0700) SpO2:  [86 %-99 %] 97 % (02/12 0700) FiO2 (%):  [100 %] 100 % (02/12 0400) Weight:  [172 lb 6.4 oz (78.2 kg)] 172 lb 6.4 oz (78.2 kg) (02/12 0444)  Filed Weights   04/25/14 0457 04/26/14 0500 04/27/14 0444  Weight: 163 lb (73.936 kg) 169 lb 5 oz (76.8 kg) 172 lb 6.4 oz (78.2 kg)   Weight change: 3 lb 1.4 oz (1.4 kg)     Intake/Output from previous day: 02/11 0701 - 02/12 0700 In: 1125 [P.O.:360; I.V.:765] Out: 615 [Urine:615]  Intake/Output this shift:    Current Meds: Scheduled Meds: . amLODipine  10 mg Oral Daily  . antiseptic oral rinse  7 mL Mouth Rinse q12n4p  . chlorhexidine  15 mL Mouth Rinse BID  . hydrALAZINE  20 mg Intravenous Q4H  . labetalol  300 mg Oral BID  . nitroGLYCERIN  0.4 mg Transdermal Daily  . sodium chloride  1,000 mL Intravenous Once   Continuous Infusions: . sodium chloride Stopped (04/25/14 0800)  . niCARDipine Stopped (04/26/14 1700)   PRN Meds:.sodium chloride, heparin, hydrALAZINE, HYDROmorphone (DILAUDID) injection, sorbitol  General appearance: alert, cooperative and mild distress Heart: RRR Lungs: Diminished at bases Abdomen: Soft, mild diffuse tenderness ; some bowel sounds ; no masses,  no organomegaly Extremities: No LE edema. Pulses intact bilaterally  Lab Results: CBC:  Recent Labs  04/26/14 0345 04/27/14 0318  WBC 8.3 8.4  HGB 10.8* 9.6*  HCT 32.2* 28.5*  PLT  88* 96*   BMET:   Recent Labs  04/26/14 0345 04/27/14 0318  NA 134* 134*  K 4.6 4.2  CL 111 110  CO2 17* 14*  GLUCOSE 121* 105*  BUN 29* 36*  CREATININE 3.46* 3.40*  CALCIUM 8.8 8.8    PT/INR:  No results for input(s): LABPROT, INR in the last 72 hours. Radiology: Dg Chest Port 1 View  04/27/2014   CLINICAL DATA:  Respiratory failure.  Central line placement.  EXAM: PORTABLE CHEST - 1 VIEW  COMPARISON:  04/26/2014.  FINDINGS: Left IJ line noted with tip projected over superior vena cava. B stone hilar structures are stable. Stable cardiomegaly. Diffuse progressive bilateral pulmonary alveolar infiltrates. These could be related to pulmonary edema or pneumonia. Bilateral effusions are present. No pneumothorax.  IMPRESSION: 1. Left IJ line with tip projected over superior vena cava. 2. Progressive bilateral pulmonary alveolar infiltrates consistent with pulmonary edema and/or pneumonia. Associated bilateral effusions. 3. Cardiomegaly   Electronically Signed   By: Marcello Moores  Register   On: 04/27/2014 07:15   Dg Chest Port 1 View  04/26/2014   CLINICAL DATA:  Thoracic aortic dissection, pulmonary edema.  EXAM: PORTABLE CHEST - 1 VIEW  COMPARISON:  Portable  chest x-ray of April 25, 2014  FINDINGS: The lungs are adequately inflated. Increasing density at the right lung base is consistent with atelectasis. A small right pleural effusion has developed. On the left there is persistent lower lobe atelectasis. A small left pleural effusion is suspected as well. The cardiac silhouette is enlarged. The central pulmonary vascularity is prominent but there is no definite cephalization. The mediastinum is normal in width.  IMPRESSION: Increasing bibasilar atelectasis and/or pneumonia. Small bilateral pleural effusions are present.   Electronically Signed   By: David  Martinique   On: 04/26/2014 07:40    Assessment/Plan: 1. 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 SR in the low 80's  this am.  On Noravsc 10 daily, Labetalol 300 bid, Nitro patch 0.4 daily, and Hydralazine 20 IV Q 4 hours. Cardene drip stopped yesterday early evening. SBP 140-150's this am. Needs stricter management of BP.  Will give Hydralazine 40 now. 2. Thrombocytopenia-platelets up to 96,000 3. History of CKD-left kidney is non salvageable. Creatinine remains 3.4. Temporary HD catheter placed yesterday.Nephrology following. 4. H and H stable at 9.6 and 28.5 5. Thrombocytopenia-platlets 96,000 6. Pulmonary-Still on a non re breather.CXR shows bilateral pleural effusions, increasing bibasilar atelectasis (worse on the right) Pulmonary evaluating.  Nani Skillern PA-C 04/27/2014 7:52 AM

## 2014-04-27 NOTE — Progress Notes (Signed)
Utilization Review Completed.  

## 2014-04-27 NOTE — Progress Notes (Signed)
Was off nicardipine gtt for most of night but resumed this AM C/O nasal congestion. Not reporting dyspnea.  Uo has improved and renal function stable   Filed Vitals:   04/27/14 1000 04/27/14 1100 04/27/14 1142 04/27/14 1211  BP: 143/79 126/75 126/74   Pulse: 86 79    Temp:    98.1 F (36.7 C)  TempSrc:    Oral  Resp: 26 25    Height:      Weight:      SpO2: 96% 96%     NAD HEENT WNL No JVD noted Chest bibasilar crackles, bronchial BS in B bases RRR, no M noted NABS, NT Ext warm without edema Neuro intact   I have reviewed all of today's lab results. 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) Severe hypertension - difficult to control on oral regimen Hypertensive cardiomyopathy (Severe LVH, LVEF 50% by Echo 03/28/14) Bradycardia and transient junctional rhythm while on esmolol gtt Pulm edema - clinically stable Dyspnea improved some 2/12 Hypertensive CKD AKI - due to L renal artery dissection NAG acidosis Mild hyperkalemia, resolved  PLAN/REC: Cont labetalol at current dose Cont amlodipine @ current dose Hydralazine 50 mg PO q 6 hrs initiated 2/12 Cont PRN IV hydralazine to maintain SBP < 120 mmHg Wean off nicardipine gtt as able maintaining SBP < 120 mmHg Cont supplemental O2 Cont PRN BiPAP (initiated 2/11) Follow CXR intermittently - ordered for 2/13 Renal Service following - no plan for HD today Minimize IVFs Monitor BMET intermittently Monitor I/Os Correct electrolytes as indicated Advance diet as tolerated  Merton Border, MD ; [facility] service Mobile [phone].  After 5:30 PM or weekends, call [phone]

## 2014-04-28 ENCOUNTER — Inpatient Hospital Stay (HOSPITAL_COMMUNITY): Payer: Medicare Other

## 2014-04-28 DIAGNOSIS — I10 Essential (primary) hypertension: Secondary | ICD-10-CM

## 2014-04-28 DIAGNOSIS — N179 Acute kidney failure, unspecified: Secondary | ICD-10-CM

## 2014-04-28 LAB — CBC
HEMATOCRIT: 28.6 % — AB (ref 39.0–52.0)
Hemoglobin: 9.5 g/dL — ABNORMAL LOW (ref 13.0–17.0)
MCH: 30.1 pg (ref 26.0–34.0)
MCHC: 33.2 g/dL (ref 30.0–36.0)
MCV: 90.5 fL (ref 78.0–100.0)
PLATELETS: 98 10*3/uL — AB (ref 150–400)
RBC: 3.16 MIL/uL — AB (ref 4.22–5.81)
RDW: 16.2 % — ABNORMAL HIGH (ref 11.5–15.5)
WBC: 6.7 10*3/uL (ref 4.0–10.5)

## 2014-04-28 LAB — BASIC METABOLIC PANEL
ANION GAP: 11 (ref 5–15)
BUN: 34 mg/dL — ABNORMAL HIGH (ref 6–23)
CO2: 20 mmol/L (ref 19–32)
CREATININE: 2.98 mg/dL — AB (ref 0.50–1.35)
Calcium: 8.7 mg/dL (ref 8.4–10.5)
Chloride: 103 mmol/L (ref 96–112)
GFR, EST AFRICAN AMERICAN: 28 mL/min — AB (ref >90)
GFR, EST NON AFRICAN AMERICAN: 25 mL/min — AB (ref >90)
Glucose, Bld: 117 mg/dL — ABNORMAL HIGH (ref 70–99)
POTASSIUM: 3.6 mmol/L (ref 3.5–5.1)
Sodium: 134 mmol/L — ABNORMAL LOW (ref 135–145)

## 2014-04-28 MED ORDER — CLONIDINE HCL 0.1 MG PO TABS
0.1000 mg | ORAL_TABLET | Freq: Two times a day (BID) | ORAL | Status: DC
  Administered 2014-04-28 (×2): 0.1 mg via ORAL
  Filled 2014-04-28 (×4): qty 1

## 2014-04-28 MED ORDER — FUROSEMIDE 10 MG/ML IJ SOLN
80.0000 mg | Freq: Two times a day (BID) | INTRAMUSCULAR | Status: DC
  Administered 2014-04-28 – 2014-04-29 (×4): 80 mg via INTRAVENOUS
  Filled 2014-04-28 (×7): qty 8

## 2014-04-28 MED ORDER — HYDRALAZINE HCL 50 MG PO TABS
100.0000 mg | ORAL_TABLET | Freq: Three times a day (TID) | ORAL | Status: DC
  Administered 2014-04-28 – 2014-05-03 (×16): 100 mg via ORAL
  Filled 2014-04-28 (×18): qty 2

## 2014-04-28 NOTE — Progress Notes (Signed)
[name] on  nicardipine gtt  C/O nasal congestion. Not reporting dyspnea. Or chest pain    Filed Vitals:   04/28/14 0530 04/28/14 0600 04/28/14 0630 04/28/14 0700  BP: 125/63 114/58 115/57 122/62  Pulse: 62 62 64 65  Temp:      TempSrc:      Resp: 18 18 27 26   Height:      Weight:    75.2 kg (165 lb 12.6 oz)  SpO2: 98% 97% 96% 89%   NAD HEENT WNL No JVD noted Chest bibasilar crackles, bronchial BS in B bases RRR, no M noted NABS, NT Ext warm without edema Neuro intact   I have reviewed all of today's lab results. 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) Severe hypertension - difficult to control on oral regimen Hypertensive cardiomyopathy (Severe LVH, LVEF 50% by Echo 03/28/14) Bradycardia and transient junctional rhythm while on esmolol gtt Acute Pulm edema - clinically stable, Dyspnea improved  Hypertensive CKD AKI - due to L renal artery dissection NAG acidosis Mild hyperkalemia, resolved  PLAN/REC: Cont labetalol at current dose Cont amlodipine @ current dose Hydralazine 100 mg PO tid  initiated 2/12 Add clonidine 0.1 bid & titrate Cont PRN IV hydralazine to maintain SBP < 120 mmHg Wean off nicardipine gtt as able maintaining SBP < 120 mmHg Cont supplemental O2 Cont PRN BiPAP (initiated 2/11) Follow CXR intermittently - ordered for 2/13 Renal Service following - no plan for HD today Minimize IVFs Monitor BMET intermittently Monitor I/Os Correct electrolytes as indicated Advance diet as tolerated, sit up in bed   Care during the described time interval was provided by me and/or other providers on the critical care team.  I have reviewed this patient's available data, including medical history, events of note, physical examination and test results as part of my evaluation  CC time x 71m  Rigoberto Noel. MD

## 2014-04-28 NOTE — Progress Notes (Signed)
Admit: 04/23/2014 LOS: 5  37M CKD (BL SCr ~2), hx/o uncontrolled HTN admit with type B aortic dissection, L renal infarcted, and contrast nepropathy  Subjective:  Good UOP to lasix SCr cont to improve Still req NRB   02/12 0701 - 02/13 0700 In: 2856.5 [P.O.:1509; I.V.:1347.5] Out: 4510 [Urine:4510]  Filed Weights   04/26/14 0500 04/27/14 0444 04/28/14 0700  Weight: 76.8 kg (169 lb 5 oz) 78.2 kg (172 lb 6.4 oz) 75.2 kg (165 lb 12.6 oz)    Scheduled Meds: . amLODipine  10 mg Oral Daily  . antiseptic oral rinse  7 mL Mouth Rinse q12n4p  . chlorhexidine  15 mL Mouth Rinse BID  . cloNIDine  0.1 mg Oral BID  . fluticasone  1 spray Each Nare BID  . furosemide  80 mg Intravenous BID  . hydrALAZINE  100 mg Oral TID  . labetalol  300 mg Oral BID  . nitroGLYCERIN  0.4 mg Transdermal Daily  . oxymetazoline  1 spray Each Nare BID  . sodium chloride  1,000 mL Intravenous Once   Continuous Infusions: . sodium chloride Stopped (04/25/14 0800)  . niCARDipine 10 mg/hr (04/28/14 1000)   PRN Meds:.sodium chloride, heparin, hydrALAZINE, HYDROmorphone (DILAUDID) injection, sorbitol  Current Labs: reviewed    Physical Exam:  Blood pressure 120/63, pulse 63, temperature 98.2 F (36.8 C), temperature source Axillary, resp. rate 20, height 6' (1.829 m), weight 75.2 kg (165 lb 12.6 oz), SpO2 96 %. Sleeping using NRB Diminished BS in bases, vesicular S/nt/nd abdomen RRR, nl s1s2 no rub Trace LEE  A/P 1. AoCkD 1. BL SCr 2 2. 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 Improving GFR, stable lytes, good UOP 5. Cont to follow, no RRT needs Daily weights, Daily Renal Panel, Strict I/Os, Avoid nephrotoxins (NSAIDs, judicious IV Contrast) Lasix 80 IV BID start today 1. Type B aortic dissection 1. VVS and Thoracic surgery following 2. BP management with CCM 3. LIkely to need operative repair at some point 2. HTN 1. CCM managing, decent control; workign towards all PO  meds 2. Lasix as above 3. No ACEi/ARB 3. SOB; effusions: lasix as above  Pearson Grippe MD 04/28/2014, 10:19 AM   Recent Labs Lab 04/26/14 0345 04/27/14 0318 04/28/14 0445  NA 134* 134* 134*  K 4.6 4.2 3.6  CL 111 110 103  CO2 17* 14* 20  GLUCOSE 121* 105* 117*  BUN 29* 36* 34*  CREATININE 3.46* 3.40* 2.98*  CALCIUM 8.8 8.8 8.7    Recent Labs Lab 04/23/14 2136  04/26/14 0345 04/27/14 0318 04/28/14 0445  WBC 7.4  < > 8.3 8.4 6.7  NEUTROABS 3.9  --  6.2  --   --   HGB 11.7*  < > 10.8* 9.6* 9.5*  HCT 34.4*  < > 32.2* 28.5* 28.6*  MCV 88.7  < > 91.0 88.5 90.5  PLT 81*  < > 88* 96* 98*  < > = values in this interval not displayed.

## 2014-04-28 NOTE — Progress Notes (Signed)
   Daily Progress Note  Assessment/Planning: Complex Type B aortic dissection   Still not medically stable for any intervention  HTN: controlled on Nicardipine drip, did not tolerate Esmolol, good control at this point  Non-oliguric renal failure: remains responsive to Lasix, 4.5 L UOP yesterday  Thrombocytopenia: stabilizing  Anemia: stabilizing  Dr. Oneida Alar will be back on Monday.  No intervention planned over weekend   Subjective    No complaints today, breathing better  Objective Filed Vitals:   04/28/14 0530 04/28/14 0600 04/28/14 0630 04/28/14 0700  BP: 125/63 114/58 115/57 122/62  Pulse: 62 62 64 65  Temp:      TempSrc:      Resp: 18 18 27 26   Height:      Weight:    165 lb 12.6 oz (75.2 kg)  SpO2: 98% 97% 96% 89%    Intake/Output Summary (Last 24 hours) at 04/28/14 0806 Last data filed at 04/28/14 0700  Gross per 24 hour  Intake 2856.5 ml  Output   4410 ml  Net -1553.5 ml    PULM  BLL rales CV  RRR GI  soft, NTND VASC  Palpable radial and pedal pulses  Radiology: Dg Chest Port 1 View  04/27/2014   CLINICAL DATA:  Respiratory failure.  Central line placement.  EXAM: PORTABLE CHEST - 1 VIEW  COMPARISON:  04/26/2014.  FINDINGS: Left IJ line noted with tip projected over superior vena cava. B stone hilar structures are stable. Stable cardiomegaly. Diffuse progressive bilateral pulmonary alveolar infiltrates. These could be related to pulmonary edema or pneumonia. Bilateral effusions are present. No pneumothorax.  IMPRESSION: 1. Left IJ line with tip projected over superior vena cava. 2. Progressive bilateral pulmonary alveolar infiltrates consistent with pulmonary edema and/or pneumonia. Associated bilateral effusions. 3. Cardiomegaly   Electronically Signed   By: Marcello Moores  Register   On: 04/27/2014 07:15     Laboratory CBC    Component Value Date/Time   WBC 6.7 04/28/2014 0445   HGB 9.5* 04/28/2014 0445   HCT 28.6* 04/28/2014 0445   PLT 98* 04/28/2014  0445    BMET    Component Value Date/Time   NA 134* 04/28/2014 0445   K 3.6 04/28/2014 0445   CL 103 04/28/2014 0445   CO2 20 04/28/2014 0445   GLUCOSE 117* 04/28/2014 0445   BUN 34* 04/28/2014 0445   CREATININE 2.98* 04/28/2014 0445   CALCIUM 8.7 04/28/2014 0445   GFRNONAA 25* 04/28/2014 0445   GFRAA 28* 04/28/2014 0445    Adele Barthel, MD [facility] Office: [phone] Pager: [phone]  04/28/2014, 8:06 AM

## 2014-04-29 LAB — BASIC METABOLIC PANEL
ANION GAP: 9 (ref 5–15)
BUN: 27 mg/dL — ABNORMAL HIGH (ref 6–23)
CO2: 24 mmol/L (ref 19–32)
Calcium: 8.7 mg/dL (ref 8.4–10.5)
Chloride: 101 mmol/L (ref 96–112)
Creatinine, Ser: 2.7 mg/dL — ABNORMAL HIGH (ref 0.50–1.35)
GFR, EST AFRICAN AMERICAN: 32 mL/min — AB (ref >90)
GFR, EST NON AFRICAN AMERICAN: 28 mL/min — AB (ref >90)
GLUCOSE: 120 mg/dL — AB (ref 70–99)
Potassium: 2.9 mmol/L — ABNORMAL LOW (ref 3.5–5.1)
SODIUM: 134 mmol/L — AB (ref 135–145)

## 2014-04-29 MED ORDER — MINOXIDIL 2.5 MG PO TABS
2.5000 mg | ORAL_TABLET | Freq: Every day | ORAL | Status: DC
  Administered 2014-04-29: 2.5 mg via ORAL
  Filled 2014-04-29 (×2): qty 1

## 2014-04-29 MED ORDER — POTASSIUM CHLORIDE 10 MEQ/50ML IV SOLN
10.0000 meq | INTRAVENOUS | Status: AC
  Administered 2014-04-29 (×4): 10 meq via INTRAVENOUS
  Filled 2014-04-29: qty 50

## 2014-04-29 MED ORDER — CLONIDINE HCL 0.2 MG PO TABS
0.2000 mg | ORAL_TABLET | Freq: Two times a day (BID) | ORAL | Status: DC
  Filled 2014-04-29 (×2): qty 1

## 2014-04-29 MED ORDER — CLONIDINE HCL 0.2 MG PO TABS
0.2000 mg | ORAL_TABLET | Freq: Three times a day (TID) | ORAL | Status: DC
  Administered 2014-04-29 (×3): 0.2 mg via ORAL
  Filled 2014-04-29 (×6): qty 1

## 2014-04-29 NOTE — Progress Notes (Signed)
afebrile Remains on  nicardipine gtt  C/O nasal congestion. Not reporting dyspnea. Or chest pain    Filed Vitals:   04/29/14 0530 04/29/14 0600 04/29/14 0630 04/29/14 0700  BP: 129/69 131/72 121/73 126/75  Pulse: 79 78 79 81  Temp:      TempSrc:      Resp: 17 17 17 17   Height:      Weight: 86.637 kg (191 lb)     SpO2: 95% 96% 97% 98%   NAD HEENT WNL No JVD noted Chest bibasilar crackles, bronchial BS in B bases RRR, no M noted NABS, NT Ext warm without edema Neuro intact   I have reviewed all of today's lab results. 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) Severe hypertension - difficult to control on oral regimen Hypertensive cardiomyopathy (Severe LVH, LVEF 50% by Echo 03/28/14) Bradycardia and transient junctional rhythm while on esmolol gtt Acute Pulm edema - clinically stable, Dyspnea improved  Hypertensive CKD AKI - due to L renal artery dissection NAG acidosis Mild hyperkalemia, resolved  PLAN/REC: Cont labetalol at current dose Cont amlodipine @ current dose Hydralazine 100 mg PO tid  initiated 2/12 Increase clonidine 0.2 tid & added minoxidil 2.5  Cont PRN IV hydralazine to maintain SBP < 120 mmHg Wean off nicardipine gtt as able maintaining SBP < 130 mmHg Cont supplemental O2 Cont PRN BiPAP for WOB only- does not tolerate Renal Service following - no plan for HD today Dc  IVFs Monitor BMET intermittently Monitor I/Os Correct electrolytes as indicated Advance diet as tolerated, sit up in chair  Care during the described time interval was provided by me and/or other providers on the critical care team.  I have reviewed this patient's available data, including medical history, events of note, physical examination and test results as part of my evaluation  CC time x 28m  Rigoberto Noel. MD

## 2014-04-29 NOTE — Progress Notes (Signed)
eLink Physician-Brief Progress Note Patient Name: [name] DOB: 20-Mar-1970 MRN: [id number]   Date of Service  04/29/2014  HPI/Events of Note  K Low   eICU Interventions  K IV supp     Intervention Category Major Interventions: Electrolyte abnormality - evaluation and management  Asencion Noble 04/29/2014, 6:34 AM

## 2014-04-29 NOTE — Progress Notes (Signed)
Admit: 04/23/2014 LOS: 6  35M CKD (BL SCr ~2), hx/o uncontrolled HTN admit with type B aortic dissection, L renal infarcted, and contrast nepropathy  Subjective:  Good UOP to lasix SCr cont to improve Still req NRB   02/13 0701 - 02/14 0700 In: 3490 [P.O.:2160; I.V.:1330] Out: 5650 [Urine:5650]  Filed Weights   04/27/14 0444 04/28/14 0700 04/29/14 0530  Weight: 78.2 kg (172 lb 6.4 oz) 75.2 kg (165 lb 12.6 oz) 86.637 kg (191 lb)    Scheduled Meds: . amLODipine  10 mg Oral Daily  . antiseptic oral rinse  7 mL Mouth Rinse q12n4p  . chlorhexidine  15 mL Mouth Rinse BID  . cloNIDine  0.2 mg Oral BID  . fluticasone  1 spray Each Nare BID  . furosemide  80 mg Intravenous BID  . hydrALAZINE  100 mg Oral TID  . labetalol  300 mg Oral BID  . nitroGLYCERIN  0.4 mg Transdermal Daily  . oxymetazoline  1 spray Each Nare BID  . potassium chloride  10 mEq Intravenous Q1 Hr x 4  . sodium chloride  1,000 mL Intravenous Once   Continuous Infusions: . sodium chloride 10 mL/hr at 04/29/14 0600  . niCARDipine 10 mg/hr (04/29/14 0600)   PRN Meds:.sodium chloride, heparin, hydrALAZINE, HYDROmorphone (DILAUDID) injection, sorbitol  Current Labs: reviewed    Physical Exam:  Blood pressure 121/73, pulse 79, temperature 99.2 F (37.3 C), temperature source Axillary, resp. rate 17, height 6' (1.829 m), weight 86.637 kg (191 lb), SpO2 97 %. Sleeping using NRB Diminished BS in bases, vesicular S/nt/nd abdomen RRR, nl s1s2 no rub Trace LEE  A/P 1. AoCKD 1. BL SCr 2 2. 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 Improving GFR, stable lytes, good UOP 5. Cont to follow, no RRT needs; OK with HD Catheter removal today Daily weights, Daily Renal Panel, Strict I/Os, Avoid nephrotoxins (NSAIDs, judicious IV Contrast) Cont BID lasix for another 24h 1. Type B aortic dissection 1. VVS and Thoracic surgery following 2. BP management with CCM 3. LIkely to need operative repair at some  point 2. HTN 1. CCM managing, decent control; workign towards all PO meds 2. Lasix as above 3. No ACEi/ARB 3. SOB; effusions: lasix as above  Pearson Grippe MD 04/29/2014, 7:25 AM   Recent Labs Lab 04/27/14 0318 04/28/14 0445 04/29/14 0438  NA 134* 134* 134*  K 4.2 3.6 2.9*  CL 110 103 101  CO2 14* 20 24  GLUCOSE 105* 117* 120*  BUN 36* 34* 27*  CREATININE 3.40* 2.98* 2.70*  CALCIUM 8.8 8.7 8.7    Recent Labs Lab 04/23/14 2136  04/26/14 0345 04/27/14 0318 04/28/14 0445  WBC 7.4  < > 8.3 8.4 6.7  NEUTROABS 3.9  --  6.2  --   --   HGB 11.7*  < > 10.8* 9.6* 9.5*  HCT 34.4*  < > 32.2* 28.5* 28.6*  MCV 88.7  < > 91.0 88.5 90.5  PLT 81*  < > 88* 96* 98*  < > = values in this interval not displayed.

## 2014-04-30 LAB — BASIC METABOLIC PANEL
ANION GAP: 14 (ref 5–15)
BUN: 24 mg/dL — AB (ref 6–23)
CALCIUM: 8.5 mg/dL (ref 8.4–10.5)
CO2: 29 mmol/L (ref 19–32)
CREATININE: 2.65 mg/dL — AB (ref 0.50–1.35)
Chloride: 91 mmol/L — ABNORMAL LOW (ref 96–112)
GFR, EST AFRICAN AMERICAN: 33 mL/min — AB (ref >90)
GFR, EST NON AFRICAN AMERICAN: 28 mL/min — AB (ref >90)
Glucose, Bld: 90 mg/dL (ref 70–99)
Potassium: 2.5 mmol/L — CL (ref 3.5–5.1)
Sodium: 134 mmol/L — ABNORMAL LOW (ref 135–145)

## 2014-04-30 LAB — CBC
HEMATOCRIT: 28.6 % — AB (ref 39.0–52.0)
Hemoglobin: 9.6 g/dL — ABNORMAL LOW (ref 13.0–17.0)
MCH: 29.3 pg (ref 26.0–34.0)
MCHC: 33.6 g/dL (ref 30.0–36.0)
MCV: 87.2 fL (ref 78.0–100.0)
Platelets: 125 10*3/uL — ABNORMAL LOW (ref 150–400)
RBC: 3.28 MIL/uL — AB (ref 4.22–5.81)
RDW: 14.8 % (ref 11.5–15.5)
WBC: 6.7 10*3/uL (ref 4.0–10.5)

## 2014-04-30 MED ORDER — POTASSIUM CHLORIDE CRYS ER 20 MEQ PO TBCR
40.0000 meq | EXTENDED_RELEASE_TABLET | Freq: Two times a day (BID) | ORAL | Status: AC
  Administered 2014-04-30 (×2): 40 meq via ORAL
  Filled 2014-04-30 (×2): qty 2

## 2014-04-30 MED ORDER — CLONIDINE HCL 0.2 MG PO TABS
0.2000 mg | ORAL_TABLET | ORAL | Status: DC | PRN
  Administered 2014-04-30 – 2014-05-01 (×3): 0.2 mg via ORAL
  Filled 2014-04-30 (×5): qty 1

## 2014-04-30 MED ORDER — CLONIDINE HCL 0.2 MG PO TABS
0.2000 mg | ORAL_TABLET | Freq: Three times a day (TID) | ORAL | Status: DC
  Administered 2014-04-30 – 2014-05-03 (×10): 0.2 mg via ORAL
  Filled 2014-04-30 (×11): qty 1

## 2014-04-30 MED ORDER — POTASSIUM CHLORIDE 20 MEQ/15ML (10%) PO SOLN: 40.0000 meq | Freq: Once | ORAL | Status: DC

## 2014-04-30 MED ORDER — POTASSIUM CHLORIDE 10 MEQ/50ML IV SOLN
10.0000 meq | INTRAVENOUS | Status: AC
  Administered 2014-04-30 (×2): 10 meq via INTRAVENOUS
  Filled 2014-04-30: qty 50

## 2014-04-30 MED ORDER — CEPHALEXIN 500 MG PO CAPS
500.0000 mg | ORAL_CAPSULE | Freq: Two times a day (BID) | ORAL | Status: DC
  Administered 2014-04-30 – 2014-05-03 (×7): 500 mg via ORAL
  Filled 2014-04-30 (×8): qty 1

## 2014-04-30 MED ORDER — POTASSIUM CHLORIDE CRYS ER 20 MEQ PO TBCR: 40.0000 meq | EXTENDED_RELEASE_TABLET | Freq: Two times a day (BID) | ORAL | Status: DC

## 2014-04-30 MED ORDER — CLONIDINE HCL 0.3 MG PO TABS
0.3000 mg | ORAL_TABLET | Freq: Three times a day (TID) | ORAL | Status: DC
  Filled 2014-04-30 (×3): qty 1

## 2014-04-30 MED ORDER — MINOXIDIL 2.5 MG PO TABS
2.5000 mg | ORAL_TABLET | Freq: Two times a day (BID) | ORAL | Status: DC
  Administered 2014-04-30 – 2014-05-03 (×7): 2.5 mg via ORAL
  Filled 2014-04-30 (×8): qty 1

## 2014-04-30 NOTE — Progress Notes (Signed)
IV consult placed to have former PIV site, Left forearm, evaluated.

## 2014-04-30 NOTE — Progress Notes (Signed)
PULMONARY / CRITICAL CARE MEDICINE   Name:[name] [id number] DOB:Aug 09, 1970   ADMISSION DATE: 04/23/2014 CONSULTATION DATE: 04/23/2014  REFERRING MD : EDP  CHIEF COMPLAINT: Chest pain  INITIAL PRESENTATION: 44 year old male presented [facility] ED 2/8 c/o stabbing chest pain/back. He has history of multiple admissions for uncontrolled HTN. CTA shows type B aortic dissection. PCCM consulted for admission.  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 Devascularization of the left kidney. Left renal artery origin and celiac axis origin appear from the false lumen. Superior mesenteric artery and right renal artery origins appear from the true lumen.  Subjective/ Overnight:  Remains on cardene gtt.  C/o worsening L forearm pain and swelling at former IV site.  Denies chest pain.   Filed Vitals:   04/30/14 0800 04/30/14 0815 04/30/14 0830 04/30/14 0845  BP: 125/62 114/62 129/75 126/73  Pulse: 80 96 79 75  Temp:      TempSrc:      Resp: 17 20 16 16   Height:      Weight:      SpO2: 94% 91% 90% 93%   EXAM:  General: wdwn male, NAD in bed  Neuro: awake, alert, appropriate, MAE  CV: s1s2 rrr, BP 120/70 on cardene gtt  PULM: resps even non labored on 6L [place], few scattered crackles GI: abd soft, +bs  Extremities: warm and dry, LUE forearm edematous, tight, erythematous from infiltrated IV     Recent Labs Lab 04/26/14 0345 04/27/14 0318 04/28/14 0445  HGB 10.8* 9.6* 9.5*  HCT 32.2* 28.5* 28.6*  WBC 8.3 8.4 6.7  PLT 88* 96* 98*    Recent Labs Lab 04/26/14 0345 04/27/14 0318 04/28/14 0445 04/29/14 0438 04/30/14 0400  NA 134* 134* 134* 134* 134*  K 4.6 4.2 3.6 2.9* 2.5*  CL 111 110 103 101 91*  CO2 17* 14* 20 24 29   GLUCOSE 121* 105* 117* 120* 90  BUN 29* 36* 34* 27* 24*  CREATININE 3.46* 3.40* 2.98*  2.70* 2.65*  CALCIUM 8.8 8.8 8.7 8.7 8.5    Recent Labs Lab 04/23/14 2136 04/24/14 0415 04/26/14 0345  AST 20  --  22  ALT 12  --  20  ALKPHOS 71  --  51  BILITOT 0.9  --  1.0  PROT 6.6  --  5.9*  ALBUMIN 3.8  --  3.1*  INR  --  1.11  --      IMPRESSION/PLAN:   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  Acute Pulm edema - clinically stable, Dyspnea improving but remains on 6L [place] Hypertensive CKD AKI - due to L renal artery dissection Hypokalemia  LUE phlebitis   PLAN/REC: labetalol 300mg  BID  amlodipine 10mg  daily  Hydralazine 100 mg PO tid  initiated 2/12 clonidine 0.2 tid 2/15 Increase minoxidil to 2.5mg  BID per renal recs  Cont PRN IV hydralazine to maintain SBP < 120 mmHg Wean off nicardipine gtt as able maintaining SBP < 130 mmHg Cont supplemental O2 and wean as able  Mobilize  No further needs bipap  Renal Service following - no plans for HD, ok to remove HD cath 2/15 Monitor BMET intermittently Monitor I/Os  Replete K  Ice packs, elevate LUE -- esmolol and cardene not irritant rx per pharmacy and IV team   OK for tx floor once off cardene gtt   Nickolas Madrid, NP 04/30/2014  9:29 AM Pager: [phone] or [phone]   Reviewed above, examined.  He denies chest pain, dyspnea.  Lungs with scattered rhonchi.  BP stable off cardene.  Still needing Oxygen.  Will transfer to SDU.  Monitor BP closely.  F/u CXR.  Chesley Mires, MD [facility] Pulmonary/Critical Care 04/30/2014, 12:02 PM Pager:  [phone] After 3pm call: [phone]

## 2014-04-30 NOTE — Progress Notes (Signed)
[place] Physician Progress Note and Electrolyte Replacement  Patient Name: [name] DOB: 1970-09-02 MRN: [id number]  Date of Service  04/30/2014   HPI/Events of Note    Recent Labs Lab 04/25/14 0256 04/26/14 0345 04/27/14 0318 04/28/14 0445 04/29/14 0438  NA 135 134* 134* 134* 134*  K 4.8 4.6 4.2 3.6 2.9*  CL 109 111 110 103 101  CO2 15* 17* 14* 20 24  GLUCOSE 128* 121* 105* 117* 120*  BUN 20 29* 36* 34* 27*  CREATININE 2.85* 3.46* 3.40* 2.98* 2.70*  CALCIUM 8.8 8.8 8.8 8.7 8.7    Estimated Creatinine Clearance: 35.9 mL/min (by C-G formula based on Cr of 2.7).  Intake/Output      02/14 0701 - 02/15 0700   P.O. 1440   I.V. (mL/kg) 380 (5.4)   IV Piggyback 200   Total Intake(mL/kg) 2020 (28.7)   Urine (mL/kg/hr) 5200 (3.1)   Total Output 5200   Net -3180        - I/O DETAILED x 24h    Total I/O In: -  Out: 2300 [Urine:2300] - I/O THIS SHIFT    ASSESSMENT Low K imprivng creat;makes urine  eICURN Interventions  26meq kcl via tube 90meq kcl via IV Check mag and phos   ASSESSMENT: MAJOR ELECTROLYTE      Dr. Brand Males, M.D., F.C.C.P Pulmonary and Critical Care Medicine Staff Physician [facility] Pager: [phone], If no answer or between  15:00h - 7:00h: call [phone]  04/30/2014 5:12 AM

## 2014-04-30 NOTE — Plan of Care (Signed)
Problem: Phase II Progression Outcomes Goal: Vital signs remain stable Outcome: Progressing Pt on po antihypertensives and iv boluses

## 2014-04-30 NOTE — Progress Notes (Signed)
  [facility] Progress Note  04/30/2014 11:03 AM   Subjective:  Having "right face" and right head pain. Denies CP and SOB.   T 99.9 BP 102/55 02 92% 6L  Filed Vitals:   04/30/14 1045  BP: 102/55  Pulse: 77  Temp:   Resp: 20    Physical Exam: General: resting in bed in NAD Cardiac: regular rate and rhythm, no m/g/r Lungs: rales bilaterally Extremities:  2+ radial pulses bilaterally, left forearm IV infiltrated yesterday with erythema and area of induration, left forearm is soft but is tender at area of induration. Sensation intact to arm. 5/5 left hand grip. 2+ dorsalis pedis pulses bilaterally.   CBC    Component Value Date/Time   WBC 6.7 04/28/2014 0445   RBC 3.16* 04/28/2014 0445   HGB 9.5* 04/28/2014 0445   HCT 28.6* 04/28/2014 0445   PLT 98* 04/28/2014 0445   MCV 90.5 04/28/2014 0445   MCH 30.1 04/28/2014 0445   MCHC 33.2 04/28/2014 0445   RDW 16.2* 04/28/2014 0445   LYMPHSABS 0.7 04/26/2014 0345   MONOABS 1.4* 04/26/2014 0345   EOSABS 0.0 04/26/2014 0345   BASOSABS 0.0 04/26/2014 0345    BMET    Component Value Date/Time   NA 134* 04/30/2014 0400   K 2.5* 04/30/2014 0400   CL 91* 04/30/2014 0400   CO2 29 04/30/2014 0400   GLUCOSE 90 04/30/2014 0400   BUN 24* 04/30/2014 0400   CREATININE 2.65* 04/30/2014 0400   CALCIUM 8.5 04/30/2014 0400   GFRNONAA 28* 04/30/2014 0400   GFRAA 33* 04/30/2014 0400    INR    Component Value Date/Time   INR 1.11 04/24/2014 0415     Intake/Output Summary (Last 24 hours) at 04/30/14 1103 Last data filed at 04/30/14 1000  Gross per 24 hour  Intake   1370 ml  Output   4970 ml  Net  -3600 ml     Assessment/Plan:   44 y.o. male with: -Type B aortic dissection: Off cardene gtt this am. BP ok. Currently 102/55. Management of BP per CCM and renal.  -Acute on chronic kidney disease: Good UOP. Creatinine trending down.   -Left arm erythema: IV infiltrated yesterday. No compartment syndrome.  Area  of erythema and induration. Low grade fever. Will check WBC. Start keflex for possible cellulitis.  -Hypokalemia: repleting -SOB: on 6L [place]. Wean 02 as able. -Not yet medically stable enough for surgical intervention. Dr. Oneida Alar will be back Wednesday to decide between surgical management or continued medical management.    Virgina Jock, PA-C [facility] Office: [phone] Pager: [phone] 04/30/2014 11:03 AM     I agree with the above.  I have seen and examined the patient.  His respirations appear to be improved.  He does not complain of chest or back pain.  He is not complaining of any trouble breathing anymore.  His creatinine is trending down and he has had good response to Lasix.  I discussed with the patient laying his operation until he has fully recovered from his acute illness (pulmonary and renal).  This will also give his aorta a chance to heal.  Annamarie Major

## 2014-04-30 NOTE — Progress Notes (Signed)
Admit: 04/23/2014 LOS: 7  89M CKD (BL SCr ~2), hx/o uncontrolled HTN admit with type B aortic dissection, L renal infarcted, and contrast nepropathy  Subjective:  Excellent UOP to lasix- 3 liters negative- BP perfect but needing cardene SCr cont to improve Still req NRB  New cellulitis on left arm related to IV ? Warm and cool compresses   02/14 0701 - 02/15 0700 In: 2120 [P.O.:1440; I.V.:380; IV Piggyback:300] Out: 5700 [Urine:5700]  Filed Weights   04/28/14 0700 04/29/14 0530 04/30/14 0400  Weight: 75.2 kg (165 lb 12.6 oz) 86.637 kg (191 lb) 70.4 kg (155 lb 3.3 oz)    Scheduled Meds: . amLODipine  10 mg Oral Daily  . cloNIDine  0.2 mg Oral TID  . fluticasone  1 spray Each Nare BID  . furosemide  80 mg Intravenous BID  . hydrALAZINE  100 mg Oral TID  . labetalol  300 mg Oral BID  . minoxidil  2.5 mg Oral Daily  . nitroGLYCERIN  0.4 mg Transdermal Daily  . oxymetazoline  1 spray Each Nare BID  . potassium chloride  10 mEq Intravenous Q1 Hr x 2  . potassium chloride  40 mEq Per Tube Once  . sodium chloride  1,000 mL Intravenous Once   Continuous Infusions: . sodium chloride Stopped (04/29/14 1000)  . niCARDipine 4 mg/hr (04/30/14 0648)   PRN Meds:.sodium chloride, heparin, hydrALAZINE, HYDROmorphone (DILAUDID) injection, sorbitol  Current Labs: reviewed    Physical Exam:  Blood pressure 125/70, pulse 81, temperature 98.4 F (36.9 C), temperature source Oral, resp. rate 19, height 6' (1.829 m), weight 70.4 kg (155 lb 3.3 oz), SpO2 92 %. Sleeping using NRB Diminished BS in bases, vesicular S/nt/nd abdomen RRR, nl s1s2 no rub Trace LEE  Ewing/P 1. AoCKD 1. BL SCr 2 2. 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 Improving GFR, great UOP, now low potassium 5. Cont to follow, no RRT needs; OK with HD Catheter removal today Daily weights, Daily Renal Panel, Strict I/Os, Avoid nephrotoxins (NSAIDs, judicious IV Contrast) Stop lasix 1. Type B aortic  dissection 1. VVS and Thoracic surgery following 2. BP management with CCM 3. LIkely to need operative repair at some point 2. HTN 1. CCM managing, decent control; working towards all PO meds- amlodipine 10, clonidine 0.2 TID, hydralazine 100 TID, labetalol 300 BID, minoxidil 2.5 daily and prn cardene/hydralazine 2. Will stop lasix given hypokalemia and excellent UOP 3. No ACEi/ARB- change I would recommend is to increase minoxidil to BID, then to 5 BID 3. SOB; effusions: lasix as above- seems better 4. Hypokalemia- written for 60 IV and 40 PO today, will give another 40 times 2 orally today   [name]   04/30/2014, 6:57 AM   Recent Labs Lab 04/28/14 0445 04/29/14 0438 04/30/14 0400  NA 134* 134* 134*  K 3.6 2.9* 2.5*  CL 103 101 91*  CO2 20 24 29   GLUCOSE 117* 120* 90  BUN 34* 27* 24*  CREATININE 2.98* 2.70* 2.65*  CALCIUM 8.7 8.7 8.5    Recent Labs Lab 04/23/14 2136  04/26/14 0345 04/27/14 0318 04/28/14 0445  WBC 7.4  < > 8.3 8.4 6.7  NEUTROABS 3.9  --  6.2  --   --   HGB 11.7*  < > 10.8* 9.6* 9.5*  HCT 34.4*  < > 32.2* 28.5* 28.6*  MCV 88.7  < > 91.0 88.5 90.5  PLT 81*  < > 88* 96* 98*  < > = values  in this interval not displayed.

## 2014-04-30 NOTE — Progress Notes (Signed)
Recieved call back from IV team regarding pt former PIV site on left forearm .  IV team advised me to call pharmacy.  Called pharmacy and spoke with Delsa Sale, the pharmD.  She advised that neither Cardene or esmolol are considered irritant drugs.  Will continue to monitor.

## 2014-04-30 NOTE — Progress Notes (Signed)
Report called to RN for 3S01 and pt transferred via wheelchair with oxygen ,Family informed

## 2014-05-01 ENCOUNTER — Inpatient Hospital Stay (HOSPITAL_COMMUNITY): Payer: Medicare Other

## 2014-05-01 LAB — CBC
HCT: 27.5 % — ABNORMAL LOW (ref 39.0–52.0)
HEMOGLOBIN: 9.4 g/dL — AB (ref 13.0–17.0)
MCH: 29.8 pg (ref 26.0–34.0)
MCHC: 34.2 g/dL (ref 30.0–36.0)
MCV: 87.3 fL (ref 78.0–100.0)
PLATELETS: 127 10*3/uL — AB (ref 150–400)
RBC: 3.15 MIL/uL — AB (ref 4.22–5.81)
RDW: 14.6 % (ref 11.5–15.5)
WBC: 6.5 10*3/uL (ref 4.0–10.5)

## 2014-05-01 LAB — PHOSPHORUS: Phosphorus: 3.5 mg/dL (ref 2.3–4.6)

## 2014-05-01 LAB — BASIC METABOLIC PANEL
Anion gap: 13 (ref 5–15)
BUN: 24 mg/dL — ABNORMAL HIGH (ref 6–23)
CO2: 26 mmol/L (ref 19–32)
Calcium: 8.8 mg/dL (ref 8.4–10.5)
Chloride: 94 mmol/L — ABNORMAL LOW (ref 96–112)
Creatinine, Ser: 2.48 mg/dL — ABNORMAL HIGH (ref 0.50–1.35)
GFR calc Af Amer: 35 mL/min — ABNORMAL LOW (ref >90)
GFR calc non Af Amer: 31 mL/min — ABNORMAL LOW (ref >90)
Glucose, Bld: 99 mg/dL (ref 70–99)
POTASSIUM: 3.1 mmol/L — AB (ref 3.5–5.1)
SODIUM: 133 mmol/L — AB (ref 135–145)

## 2014-05-01 LAB — MAGNESIUM: Magnesium: 2 mg/dL (ref 1.5–2.5)

## 2014-05-01 MED ORDER — POTASSIUM CHLORIDE CRYS ER 20 MEQ PO TBCR
20.0000 meq | EXTENDED_RELEASE_TABLET | Freq: Two times a day (BID) | ORAL | Status: AC
  Administered 2014-05-01 (×2): 20 meq via ORAL
  Filled 2014-05-01 (×2): qty 1

## 2014-05-01 MED ORDER — POTASSIUM CHLORIDE CRYS ER 20 MEQ PO TBCR
40.0000 meq | EXTENDED_RELEASE_TABLET | Freq: Once | ORAL | Status: AC
  Administered 2014-05-01: 40 meq via ORAL
  Filled 2014-05-01: qty 2

## 2014-05-01 NOTE — Progress Notes (Signed)
Admit: 04/23/2014 LOS: 8  20M CKD (BL SCr ~2), hx/o uncontrolled HTN admit with type B aortic dissection, L renal infarcted, and contrast nepropathy  Subjective:  Reasonable UOP, creatinine down some- no longer needing cardene SCr cont to improve Now on [place]  New cellulitis on left arm related to IV - started on keflex  02/15 0701 - 02/16 0700 In: 1240 [P.O.:1200; I.V.:40] Out: 1145 [Urine:1145]  Filed Weights   04/29/14 0530 04/30/14 0400 05/01/14 0531  Weight: 86.637 kg (191 lb) 70.4 kg (155 lb 3.3 oz) 76.3 kg (168 lb 3.4 oz)    Scheduled Meds: . amLODipine  10 mg Oral Daily  . cephALEXin  500 mg Oral Q12H  . cloNIDine  0.2 mg Oral TID  . fluticasone  1 spray Each Nare BID  . hydrALAZINE  100 mg Oral TID  . labetalol  300 mg Oral BID  . minoxidil  2.5 mg Oral BID  . nitroGLYCERIN  0.4 mg Transdermal Daily   Continuous Infusions: . sodium chloride Stopped (04/29/14 1000)   PRN Meds:.sodium chloride, cloNIDine, heparin, hydrALAZINE, HYDROmorphone (DILAUDID) injection, sorbitol  Current Labs: reviewed    Physical Exam:  Blood pressure 126/61, pulse 73, temperature 99.2 F (37.3 C), temperature source Oral, resp. rate 18, height 6' (1.829 m), weight 76.3 kg (168 lb 3.4 oz), SpO2 95 %. Sleeping - on [place] Diminished BS in bases, vesicular S/nt/nd abdomen RRR, nl s1s2 no rub No edema  A/P 1. AoCKD 1. BL SCr 2 2. 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 Improving GFR, good UOP, now low potassium 5. Cont to follow, no RRT needs;  Daily weights, Daily Renal Panel, Strict I/Os, Avoid nephrotoxins (NSAIDs, judicious IV Contrast) Stop lasix 1. Type B aortic dissection 1. VVS and Thoracic surgery following 2. BP management with CCM- feel like things are tuned up pretty well at this time 3. LIkely to need operative repair at some point 2. HTN 1. CCM managing, great control-  amlodipine 10, clonidine 0.2 TID, hydralazine 100 TID, labetalol 300 BID, minoxidil  2.5 BID and prn cardene/hydralazine- has not needed at least last 12 hours- no changes for now- have sincere doubts as to whether patient can keep up with this regimen as OP 2.  lasix stopped given hypokalemia and excellent UOP 3. No ACEi/ARB-  3. SOB; effusions: lasix as above- seems better 4. Hypokalemia- now above 3- will give a little more today   Brehanna Deveny A   05/01/2014, 7:37 AM   Recent Labs Lab 04/29/14 0438 04/30/14 0400 05/01/14 0258  NA 134* 134* 133*  K 2.9* 2.5* 3.1*  CL 101 91* 94*  CO2 24 29 26   GLUCOSE 120* 90 99  BUN 27* 24* 24*  CREATININE 2.70* 2.65* 2.48*  CALCIUM 8.7 8.5 8.8  PHOS  --   --  3.5    Recent Labs Lab 04/26/14 0345  04/28/14 0445 04/30/14 1516 05/01/14 0258  WBC 8.3  < > 6.7 6.7 6.5  NEUTROABS 6.2  --   --   --   --   HGB 10.8*  < > 9.5* 9.6* 9.4*  HCT 32.2*  < > 28.6* 28.6* 27.5*  MCV 91.0  < > 90.5 87.2 87.3  PLT 88*  < > 98* 125* 127*  < > = values in this interval not displayed.

## 2014-05-01 NOTE — Progress Notes (Signed)
PULMONARY / CRITICAL CARE MEDICINE   Name:[name] [id number] DOB:[birth year]   ADMISSION DATE: 04/23/2014 CONSULTATION DATE: 04/23/2014  REFERRING MD : EDP  CHIEF COMPLAINT: Chest pain  INITIAL PRESENTATION: 44 year old male presented to [name] ED 2/8 c/o stabbing chest pain/back. He has history of multiple admissions for uncontrolled HTN. CTA shows type B aortic dissection. PCCM consulted for admission.  EVENTS  2/8 CTA chest > Type B aortic dissection arising just distal to the origin of the left subclavian artery and extending into the abdominal aorta below the level of the renal arteries. Devascularization of the left kidney. Left renal artery origin and celiac axis origin appear from the false lumen. Superior mesenteric artery and right renal artery origins appear from the true lumen.  04/30/14: Remains on cardene gtt.  C/o worsening L forearm pain and swelling at former IV site.  Denies chest pain.    SUBJECTIVE/OVERNIGHT/INTERVAL HX 05/01/14: Denis complaints. On 3L [place]. Off cardene gtt. Creat better. VVS Considering intervention when better   Filed Vitals:   05/01/14 0630 05/01/14 0700 05/01/14 0730 05/01/14 0800  BP: 117/62 126/61 118/73 131/73  Pulse: 74 73 72 74  Temp:    97.8 F (36.6 C)  TempSrc:    Oral  Resp: 19 18 20 14   Height:      Weight:      SpO2: 94% 95% 95% 95%    EXAM:  General: wdwn male, NAD in bed  Neuro: awake, alert, appropriate, MAE , texting on phone CV: s1s2 rrr, BP 120/70 off cardene gtt, ? Systolic murmur +  PULM: resps even non labored on 3L [place], few scattered crackles GI: abd soft, +bs  Extremities: warm and dry, LUE forearm edematous, tight, erythematous from infiltrated IV   PULMONARY No results for input(s): PHART, PCO2ART, PO2ART, HCO3, TCO2, O2SAT in the last 168 hours.  Invalid input(s): PCO2, PO2  CBC  Recent Labs Lab 04/28/14 0445 04/30/14 1516 05/01/14 0258  HGB 9.5* 9.6* 9.4*  HCT 28.6* 28.6*  27.5*  WBC 6.7 6.7 6.5  PLT 98* 125* 127*    COAGULATION No results for input(s): INR in the last 168 hours.  CARDIAC  No results for input(s): TROPONINI in the last 168 hours. No results for input(s): PROBNP in the last 168 hours.   CHEMISTRY  Recent Labs Lab 04/27/14 0318 04/28/14 0445 04/29/14 0438 04/30/14 0400 05/01/14 0258  NA 134* 134* 134* 134* 133*  K 4.2 3.6 2.9* 2.5* 3.1*  CL 110 103 101 91* 94*  CO2 14* 20 24 29 26   GLUCOSE 105* 117* 120* 90 99  BUN 36* 34* 27* 24* 24*  CREATININE 3.40* 2.98* 2.70* 2.65* 2.48*  CALCIUM 8.8 8.7 8.7 8.5 8.8  MG  --   --   --   --  2.0  PHOS  --   --   --   --  3.5   Estimated Creatinine Clearance: 42.3 mL/min (by C-G formula based on Cr of 2.48).   LIVER  Recent Labs Lab 04/26/14 0345  AST 22  ALT 20  ALKPHOS 51  BILITOT 1.0  PROT 5.9*  ALBUMIN 3.1*     INFECTIOUS No results for input(s): LATICACIDVEN, PROCALCITON in the last 168 hours.   ENDOCRINE CBG (last 3)  No results for input(s): GLUCAP in the last 72 hours.       IMAGING x48h Dg Chest Port 1 View  05/01/2014   CLINICAL DATA:  Hypoxia, thoracoabdominal aortic dissection, acute renal failure. ,  history of aortic valve disease, CHF, and tobacco use.  EXAM: PORTABLE CHEST - 1 VIEW  COMPARISON:  Portable chest x-ray of April 28, 2014  FINDINGS: The lungs are adequately inflated. The pulmonary interstitial markings have improved sincethe earlier study. Left lower lobe atelectasis or infiltrate persists but has improved slightly. Small bilateral pleural effusions are suspected but are less conspicuous today. The cardiac silhouette remains enlarged. The mediastinum is widened but stable. The left internal jugular venous catheter has been removed. The bony thorax exhibits no acute abnormality.  IMPRESSION: Slight interval improvement in the left lower lobe atelectasis or pneumonia. Small pleural effusions are less conspicuous. Mild mediastinal widening is  unchanged.   Electronically Signed   By: David  Martinique   On: 05/01/2014 07:35    MICRO Results for orders placed or performed during the hospital encounter of 04/23/14  MRSA PCR Screening     Status: None   Collection Time: 04/24/14 12:59 AM  Result Value Ref Range Status   MRSA by PCR NEGATIVE NEGATIVE Final    Comment:        The GeneXpert MRSA Assay (FDA approved for NASAL specimens only), is one component of a comprehensive MRSA colonization surveillance program. It is not intended to diagnose MRSA infection nor to guide or monitor treatment for MRSA infections.     ANTIBIOTICS Anti-infectives    Start     Dose/Rate Route Frequency Ordered Stop   04/30/14 1145  cephALEXin (KEFLEX) capsule 500 mg     500 mg Oral Every 12 hours 04/30/14 1124          IMPRESSION/PLAN: #Baseline  -  Poorly controlled hypertension - CKD due to high bP  #Current  - Acute type B aortic dissection (involving L renal artery)  - Severe hypertension - difficult to control on oral regimen  - Hypertensive cardiomyopathy (Severe LVH, LVEF 50% by Echo 03/28/14)  - Bradycardia and transient junctional rhythm while on esmolol gtt - resolved.   - Acute Pulm edema - clinically stable, Dyspnea improving     - improved to [facility] on 05/01/2014    - AKI - due to L renal artery dissection  - -making urine and improved creat 05/01/2014   - Hypokalemia + on 05/01/2014   - LUE phlebitis   PLAN/REC:  #BP Control  labetalol 300mg  BID   amlodipine 10mg  daily   Hydralazine 100 mg PO tid  initiated 2/12  clonidine 0.2 tid 2/15  Increase minoxidil to 2.5mg  BID per renal recs   Cont PRN IV hydralazine to maintain SBP < 130 mmHg  VVS to decide surgery timing  #Pulm EDema  - dc bipap  - Cont supplemental O2 and wean as able   - Mobilize   #Renal  - Renal Service following - no plans for HD, ok to remove HD cath 2/15  - replete KCL  #Phlebiiits  - Ice packs, elevate LUE -- esmolol and cardene not  irritant rx per pharmacy and IV team  - Cephalexin - since 04/30/14 - aim to stop in  5 days   GLOBAL PCCM will sign off. TRH primary from 05/02/14 d-/w Dr Sherral Hammers.     Dr. Brand Males, M.D., [facility]C.P Pulmonary and Critical Care Medicine Staff Physician [place] [facility] Pager: [phone], If no answer or between  15:00h - 7:00h: call [phone]  05/01/2014 9:58 AM

## 2014-05-01 NOTE — Progress Notes (Addendum)
   [facility] [place]  Subjective  - He states his breathing is a whole lot better.  He reports having no back or belly pain.   Objective 126/61 73 99.2 F (37.3 C) (Oral) 18 95%  Intake/Output Summary (Last 24 hours) at 05/01/14 0715 Last data filed at 04/30/14 2300  Gross per 24 hour  Intake   1240 ml  Output   1145 ml  Net     95 ml    Palpable 3+ DP/PT pulses Abdomin soft non tender to palpation Heart RRR Lungs nonlabored breathing Left arm with continued induration, compartments soft, palpable pulse, and sensation and motor intact  Assessment/Planning: -Type B aortic dissection  Lungs non labored breathing on O2 [place] 6L TM 99, WBC 6.5 Cr trending down  BP 126/61 controlled with both PO and PRN IV clonidine and hydralazine    Theda Sers, EMMA MAUREEN 05/01/2014 7:15 AM --  Laboratory Lab Results:  Recent Labs  04/30/14 1516 05/01/14 0258  WBC 6.7 6.5  HGB 9.6* 9.4*  HCT 28.6* 27.5*  PLT 125* 127*   BMET  Recent Labs  04/30/14 0400 05/01/14 0258  NA 134* 133*  K 2.5* 3.1*  CL 91* 94*  CO2 29 26  GLUCOSE 90 99  BUN 24* 24*  CREATININE 2.65* 2.48*  CALCIUM 8.5 8.8    COAG Lab Results  Component Value Date   INR 1.11 04/24/2014   INR 1.05 03/27/2014   INR 1.18 05/23/2011   No results found for: PTT    Patient continues to improve.  He understands the logic behind delaying surgery until he stabilizes clinically.  Annamarie Major

## 2014-05-02 LAB — CBC WITH DIFFERENTIAL/PLATELET
BASOS ABS: 0 10*3/uL (ref 0.0–0.1)
BASOS PCT: 0 % (ref 0–1)
EOS PCT: 3 % (ref 0–5)
Eosinophils Absolute: 0.2 10*3/uL (ref 0.0–0.7)
HCT: 27.7 % — ABNORMAL LOW (ref 39.0–52.0)
HEMOGLOBIN: 9.5 g/dL — AB (ref 13.0–17.0)
Lymphocytes Relative: 20 % (ref 12–46)
Lymphs Abs: 1.5 10*3/uL (ref 0.7–4.0)
MCH: 29.9 pg (ref 26.0–34.0)
MCHC: 34.3 g/dL (ref 30.0–36.0)
MCV: 87.1 fL (ref 78.0–100.0)
MONO ABS: 1.9 10*3/uL — AB (ref 0.1–1.0)
Monocytes Relative: 25 % — ABNORMAL HIGH (ref 3–12)
NEUTROS ABS: 3.9 10*3/uL (ref 1.7–7.7)
NEUTROS PCT: 52 % (ref 43–77)
Platelets: 162 10*3/uL (ref 150–400)
RBC: 3.18 MIL/uL — ABNORMAL LOW (ref 4.22–5.81)
RDW: 14.7 % (ref 11.5–15.5)
WBC: 7.4 10*3/uL (ref 4.0–10.5)

## 2014-05-02 LAB — BASIC METABOLIC PANEL
ANION GAP: 4 — AB (ref 5–15)
BUN: 23 mg/dL (ref 6–23)
CHLORIDE: 97 mmol/L (ref 96–112)
CO2: 32 mmol/L (ref 19–32)
Calcium: 8.8 mg/dL (ref 8.4–10.5)
Creatinine, Ser: 2.31 mg/dL — ABNORMAL HIGH (ref 0.50–1.35)
GFR, EST AFRICAN AMERICAN: 39 mL/min — AB (ref >90)
GFR, EST NON AFRICAN AMERICAN: 33 mL/min — AB (ref >90)
Glucose, Bld: 102 mg/dL — ABNORMAL HIGH (ref 70–99)
POTASSIUM: 3.6 mmol/L (ref 3.5–5.1)
Sodium: 133 mmol/L — ABNORMAL LOW (ref 135–145)

## 2014-05-02 LAB — MAGNESIUM: Magnesium: 2.1 mg/dL (ref 1.5–2.5)

## 2014-05-02 LAB — PHOSPHORUS: PHOSPHORUS: 3.2 mg/dL (ref 2.3–4.6)

## 2014-05-02 MED ORDER — DOCUSATE SODIUM 100 MG PO CAPS
100.0000 mg | ORAL_CAPSULE | Freq: Two times a day (BID) | ORAL | Status: DC
  Administered 2014-05-02 – 2014-05-03 (×3): 100 mg via ORAL
  Filled 2014-05-02 (×3): qty 1

## 2014-05-02 MED ORDER — BISACODYL 5 MG PO TBEC
10.0000 mg | DELAYED_RELEASE_TABLET | Freq: Once | ORAL | Status: AC
  Administered 2014-05-02: 10 mg via ORAL
  Filled 2014-05-02: qty 2

## 2014-05-02 NOTE — Progress Notes (Signed)
Admit: 04/23/2014 LOS: 9  56M CKD (BL SCr ~2), hx/o uncontrolled HTN admit with type B aortic dissection, L renal infarcted, and contrast nepropathy  Subjective:  Reasonable UOP, creatinine down some- BP excellent on all oral pills Now on room air Arm looking better   02/16 0701 - 02/17 0700 In: 1560 [P.O.:1560] Out: 2100 [Urine:2100]  Filed Weights   04/30/14 0400 05/01/14 0531 05/02/14 0613  Weight: 70.4 kg (155 lb 3.3 oz) 76.3 kg (168 lb 3.4 oz) 68 kg (149 lb 14.6 oz)    Scheduled Meds: . amLODipine  10 mg Oral Daily  . cephALEXin  500 mg Oral Q12H  . cloNIDine  0.2 mg Oral TID  . fluticasone  1 spray Each Nare BID  . hydrALAZINE  100 mg Oral TID  . labetalol  300 mg Oral BID  . minoxidil  2.5 mg Oral BID  . nitroGLYCERIN  0.4 mg Transdermal Daily   Continuous Infusions: . sodium chloride Stopped (04/29/14 1000)   PRN Meds:.sodium chloride, cloNIDine, heparin, hydrALAZINE, HYDROmorphone (DILAUDID) injection, sorbitol  Current Labs: reviewed    Physical Exam:  Blood pressure 138/78, pulse 76, temperature 99.7 F (37.6 C), temperature source Oral, resp. rate 22, height 6' (1.829 m), weight 68 kg (149 lb 14.6 oz), SpO2 91 %. Sleeping - on [place] Diminished BS in bases, vesicular S/nt/nd abdomen RRR, nl s1s2 no rub No edema  A/P 1. Renal- has baseline CKD with a creatinine around 2.  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  Acute component of renal failure has been improving slowly for the last several days.  He has not required dialysis.  Possibly approaching as good as will get renal wise, but as long as creatinine coming down- would continue to watch.  Once hits nadir and stays that would be the sweet spot renal wise for surgical intervention 1. Type B aortic dissection- blood pressure is very well controlled on a complicated PO regimen.  Timing of surgical intervention is tricky but feel hemodynamically pretty stable at this point.   Patient is now saying that surgery may not be something that he wants to pursue and he wants to "take his chances" I encouraged him to listen to all the information given including the risks so that he can make the right decision for him 2. HTN 1.  Great control-  amlodipine 10, clonidine 0.2 TID, hydralazine 100 TID, labetalol 300 BID, minoxidil 2.5 BID and prn cardene/hydralazine- has not needed at least last 36 hours- no changes for now- have sincere doubts as to whether patient can keep up with this regimen as OP- after surgery watching BP will look to simplify as much as able  3. SOB; effusions: lasix as above- seems better- on room air 4. Hypokalemia- better, no repletion needed today 5. Anemia- situational and CKD- supportive care for now- no ESA  Tnia Anglada A   05/02/2014, 7:30 AM   Recent Labs Lab 04/30/14 0400 05/01/14 0258 05/02/14 0300  NA 134* 133* 133*  K 2.5* 3.1* 3.6  CL 91* 94* 97  CO2 29 26 32  GLUCOSE 90 99 102*  BUN 24* 24* 23  CREATININE 2.65* 2.48* 2.31*  CALCIUM 8.5 8.8 8.8  PHOS  --  3.5 3.2    Recent Labs Lab 04/26/14 0345  04/30/14 1516 05/01/14 0258 05/02/14 0300  WBC 8.3  < > 6.7 6.5 7.4  NEUTROABS 6.2  --   --   --  3.9  HGB 10.8*  < >  9.6* 9.4* 9.5*  HCT 32.2*  < > 28.6* 27.5* 27.7*  MCV 91.0  < > 87.2 87.3 87.1  PLT 88*  < > 125* 127* 162  < > = values in this interval not displayed.

## 2014-05-02 NOTE — Progress Notes (Signed)
  Progress Note    05/02/2014 7:55 AM Hospital Day 9  Subjective:  C/o left arm having a knot in it (IV infiltrated a couple of days ago); also c/o not having a bowel movement since being admitted.   Tm 99.7 HR 70's NSR [id number] systolic [id number] RA  Gtts:  none  Filed Vitals:   05/02/14 0722  BP: 132/80  Pulse: 78  Temp:   Resp: 22    Physical Exam: Cardiac:  regular Lungs:  CTAB Abdomen:  Soft; slightly distended; decreased BS; non tender to palpation; +flatus; -BM Extremities:  3+ bilateral palpable radials and DPs; left arm with cord and mild erythema where IV infiltrated. Left hand grip is 5/5  CBC    Component Value Date/Time   WBC 7.4 05/02/2014 0300   RBC 3.18* 05/02/2014 0300   HGB 9.5* 05/02/2014 0300   HCT 27.7* 05/02/2014 0300   PLT 162 05/02/2014 0300   MCV 87.1 05/02/2014 0300   MCH 29.9 05/02/2014 0300   MCHC 34.3 05/02/2014 0300   RDW 14.7 05/02/2014 0300   LYMPHSABS 1.5 05/02/2014 0300   MONOABS 1.9* 05/02/2014 0300   EOSABS 0.2 05/02/2014 0300   BASOSABS 0.0 05/02/2014 0300    BMET    Component Value Date/Time   NA 133* 05/02/2014 0300   K 3.6 05/02/2014 0300   CL 97 05/02/2014 0300   CO2 32 05/02/2014 0300   GLUCOSE 102* 05/02/2014 0300   BUN 23 05/02/2014 0300   CREATININE 2.31* 05/02/2014 0300   CALCIUM 8.8 05/02/2014 0300   GFRNONAA 33* 05/02/2014 0300   GFRAA 39* 05/02/2014 0300    INR    Component Value Date/Time   INR 1.11 04/24/2014 0415     Intake/Output Summary (Last 24 hours) at 05/02/14 0755 Last data filed at 05/02/14 [id number]  Gross per 24 hour  Intake   1560 ml  Output   2100 ml  Net   -540 ml     Assessment/Plan:  44 y.o. male with a type B dissection Hospital Day 9  -denies chest or back pain -pt with good blood pressure control under current regimen -creatinine continues to improve with good UOP -pt without BM since admission - does not want suppository, but willing to take something by mouth.  His  abdominal exam has not changed since I saw him last week.  I have ordered po dulcolax 10mg  x 1 dose -WBC normal with low grade feve-continue Keflex for cellulitis left arm and warm compresses -pulmonary status improving- now on room air    Leontine Locket, PA-C [facility] [phone] 05/02/2014 7:55 AM    Agree  Ruta Hinds, MD [facility] [place] Office: [phone] Pager: [phone]

## 2014-05-02 NOTE — Progress Notes (Signed)
PROGRESS NOTE  [name] [id number] DOB: Sep 24, 1970 DOA: 04/23/2014 PCP: [name], MD  Brief history  44 year old male presented to [facility] ED 2/8 c/o stabbing chest pain/back. He has history of multiple admissions for uncontrolled HTN. CTA shows type B aortic dissection arising just distal to the origin of the left subclavian artery and extending into the abdominal aorta below the level of the renal arteries. Devascularization of the left kidney. Left renal artery origin and celiac axis origin appear from the false lumen . The patient was admitted to the critical care service. He was started on a Cardene drip.the patient was subsequently transitioned to oral antihypertensive medications and his Cardene drip was weaned off. The patient was initially found to have acute pulmonary edema, but has been since weaned to room air.  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 acute component of renal failure has been improving slowly for the last several days. -renal feels once hits nadir and stays that would be the sweet spot renal wise for surgical intervention Type B Aortic Dissection -VVS and TCTS are following -operative repair to be determined by VVS/TCTS -Renal feels pt is hemodynamically stable presently -continue po anti-HTN regimen Pleural Effusions -presently stable on RA Thrombocytopenia -Platelets improving -Patient likely had a degree of myelosuppression secondary to acute medical illness Anemia of CKD  -Supportive care for now  -Continue monitor hemoglobin  Constipation -colace and  bisacodyl -pt tolerating diet Family Communication:   Pt at beside Disposition Plan:   Home when medically stable     Procedures/Studies: Dg Chest Port 1 View  05/01/2014   CLINICAL DATA:  Hypoxia, thoracoabdominal aortic dissection, acute renal failure. , history of aortic valve disease, CHF, and tobacco use.  EXAM: PORTABLE CHEST - 1 VIEW  COMPARISON:  Portable chest x-ray of April 28, 2014  FINDINGS: The lungs are adequately inflated. The pulmonary interstitial markings have improved sincethe earlier study. Left lower lobe atelectasis or infiltrate persists but has improved slightly. Small bilateral pleural effusions are suspected but are less conspicuous today. The cardiac silhouette remains enlarged. The mediastinum is widened but stable. The left internal jugular venous catheter has been removed. The bony thorax exhibits no acute abnormality.  IMPRESSION: Slight interval improvement in the left lower lobe atelectasis or pneumonia. Small pleural effusions are less conspicuous. Mild mediastinal widening is unchanged.   Electronically Signed   By: Evonte Prestage  [place]   On: 05/01/2014 07:35   Dg Chest Port 1 View  04/28/2014   CLINICAL DATA:  Respiratory failure, shortness of breath.  EXAM: PORTABLE CHEST - 1 VIEW  COMPARISON:  Chest radiograph 04/26/2014  FINDINGS: Left internal jugular central venous catheter tip projects towards the right lateral wall of the superior vena cava. Stable cardiomegaly. Slight interval improvement bilateral heterogeneous pulmonary opacities. Small bilateral layering pleural effusions.  IMPRESSION: Left IJ central venous catheter tip is present with tip directed towards the lateral wall of the superior vena cava. Consider repositioning as clinically indicated.  Slight improvement in bilateral heterogeneous pulmonary opacities.  Persistent layering bilateral pleural effusions.  These results will be called  to the ordering clinician or representative by the Radiologist  Assistant, and communication documented in the PACS or zVision Dashboard.   Electronically Signed   By: Lovey Newcomer M.D.   On: 04/28/2014 08:35   Dg Chest Port 1 View  04/27/2014   CLINICAL DATA:  Respiratory failure.  Central line placement.  EXAM: PORTABLE CHEST - 1 VIEW  COMPARISON:  04/26/2014.  FINDINGS: Left IJ line noted with tip projected over superior vena cava. B stone hilar structures are stable. Stable cardiomegaly. Diffuse progressive bilateral pulmonary alveolar infiltrates. These could be related to pulmonary edema or pneumonia. Bilateral effusions are present. No pneumothorax.  IMPRESSION: 1. Left IJ line with tip projected over superior vena cava. 2. Progressive bilateral pulmonary alveolar infiltrates consistent with pulmonary edema and/or pneumonia. Associated bilateral effusions. 3. Cardiomegaly   Electronically Signed   By: Marcello Moores  [place]   On: 04/27/2014 07:15   Dg Chest Port 1 View  04/26/2014   CLINICAL DATA:  Thoracic aortic dissection, pulmonary edema.  EXAM: PORTABLE CHEST - 1 VIEW  COMPARISON:  Portable chest x-ray of April 25, 2014  FINDINGS: The lungs are adequately inflated. Increasing density at the right lung base is consistent with atelectasis. A small right pleural effusion has developed. On the left there is persistent lower lobe atelectasis. A small left pleural effusion is suspected as well. The cardiac silhouette is enlarged. The central pulmonary vascularity is prominent but there is no definite cephalization. The mediastinum is normal in width.  IMPRESSION: Increasing bibasilar atelectasis and/or pneumonia. Small bilateral pleural effusions are present.   Electronically Signed   By: Aubery Date  [place]   On: 04/26/2014 07:40   Dg Chest Port 1 View  04/25/2014   CLINICAL DATA:  Hypoxia and respiratory failure  EXAM: PORTABLE CHEST - 1 VIEW  COMPARISON:  04/24/2014  FINDINGS: Cardiac shadow remains mildly enlarged. Mild right basilar atelectasis is again seen.  Increasing left basilar infiltrate is noted with associated effusion. No acute bony abnormality is noted.  IMPRESSION: Increasing left basilar infiltrate with associated effusion when compared with the prior exam.   Electronically Signed   By: Inez Catalina M.D.   On: 04/25/2014 08:23   Dg Chest Port 1 View  04/24/2014   CLINICAL DATA:  Known aortic dissection. Assess for underlying airspace disease. Subsequent encounter.  EXAM: PORTABLE CHEST - 1 VIEW  COMPARISON:  CTA of the chest performed 04/23/2014  FINDINGS: Bibasilar airspace opacities are noted, with underlying vascular congestion, concerning for mild pulmonary edema. This is mostly new from the prior CTA. No definite pleural effusion or pneumothorax is seen.  The cardiomediastinal silhouette is enlarged. No acute osseous abnormalities are identified.  IMPRESSION: 1. Bibasilar airspace opacities, with underlying vascular congestion, concerning for mild pulmonary edema, mostly new from the prior study. 2. Enlarged cardiomediastinal silhouette, as previously noted.   Electronically Signed   By: Garald Balding M.D.   On: 04/24/2014 07:03   Ct Angio Chest Aorta W/cm &/or Wo/cm  04/23/2014   CLINICAL DATA:  Chest and back pain.  EXAM: CT ANGIOGRAPHY CHEST, ABDOMEN AND PELVIS  TECHNIQUE: Multidetector CT imaging through the chest, abdomen and pelvis was performed using the standard protocol during bolus administration of intravenous contrast. Multiplanar reconstructed images and MIPs were obtained and reviewed to evaluate the vascular anatomy.  CONTRAST:  176mL OMNIPAQUE IOHEXOL 350 MG/ML SOLN  COMPARISON:  CT chest 09/08/2010.  CT abdomen and pelvis 01/24/2012  FINDINGS: CTA CHEST FINDINGS  Noncontrast images of the chest demonstrate  no significant aortic or Coronary artery calcification. No evidence of intramural hematoma.  Images obtained during arterial phase of contrast injection demonstrate aortic dissection arising in the posterior aortic arch just  distal to the origin of the left subclavian artery. The dissection extends throughout the descending thoracic aorta and into the abdomen. Flow is demonstrated in both the true and false lumens. No evidence of dissection in the ascending aorta. Dilated ascending aorta with maximal diameter of 4.2 cm. No abnormal mesenteric fluid collections. Great vessel origins are patent without evidence of dissection.  Diffuse cardiac enlargement. Esophagus is decompressed. No significant lymphadenopathy in the chest. Atelectasis in both lung bases. No pneumothorax. No pleural effusions.  Review of the MIP images confirms the above findings.  CTA ABDOMEN AND PELVIS FINDINGS  Aortic dissection extends from the thoracic aorta into the abdominal aorta down to the level below the renal arteries. Dissection extends into the base of the superior mesenteric artery. The false lumen appears to be larger in size and have more brisk flow than the true lumen. The celiac axis and left renal artery appear to arise from the false lumen. The superior mesenteric artery and right renal artery appear to arise from the true lumen. Distal abdominal aorta and aortic bifurcation appear intact and patent with mild calcification. No aortic aneurysm. Asymmetry of renal nephrograms with no flow demonstrated to the left kidney. 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  The liver, spleen, gallbladder, pancreas, adrenal glands, inferior vena cava, and retroperitoneal lymph nodes are unremarkable. Stomach, small bowel, and colon are decompressed. No free air or free fluid in the abdomen.  Pelvis: Bladder wall is not thickened. Prostate gland is enlarged. No free or loculated pelvic fluid collections. No pelvic mass or lymphadenopathy. Appendix is not identified.  Bones: Normal alignment of the thoracic and lumbar spine. No destructive bone lesions appreciated.  Review of the MIP images confirms the above findings.  IMPRESSION: Type B aortic dissection arising  just distal to the origin of the left subclavian artery and extending into the abdominal aorta below the level of the renal arteries. Devascularization of the left kidney. Left renal artery origin and celiac axis origin appear from the false lumen. Superior mesenteric artery and right renal artery origins appear from the true lumen.  These results were called by telephone at the time of interpretation on 04/23/2014 at 10:43 pm to Dr. Quintella Reichert , who verbally acknowledged these results.   Electronically Signed   By: Lucienne Capers M.D.   On: 04/23/2014 22:45   Ct Cta Abd/pel W/cm &/or W/o Cm  04/23/2014   CLINICAL DATA:  Chest and back pain.  EXAM: CT ANGIOGRAPHY CHEST, ABDOMEN AND PELVIS  TECHNIQUE: Multidetector CT imaging through the chest, abdomen and pelvis was performed using the standard protocol during bolus administration of intravenous contrast. Multiplanar reconstructed images and MIPs were obtained and reviewed to evaluate the vascular anatomy.  CONTRAST:  129mL OMNIPAQUE IOHEXOL 350 MG/ML SOLN  COMPARISON:  CT chest 09/08/2010.  CT abdomen and pelvis 01/24/2012  FINDINGS: CTA CHEST FINDINGS  Noncontrast images of the chest demonstrate no significant aortic or Coronary artery calcification. No evidence of intramural hematoma.  Images obtained during arterial phase of contrast injection demonstrate aortic dissection arising in the posterior aortic arch just distal to the origin of the left subclavian artery. The dissection extends throughout the descending thoracic aorta and into the abdomen. Flow is demonstrated in both the true and false lumens. No evidence of dissection in the  ascending aorta. Dilated ascending aorta with maximal diameter of 4.2 cm. No abnormal mesenteric fluid collections. Great vessel origins are patent without evidence of dissection.  Diffuse cardiac enlargement. Esophagus is decompressed. No significant lymphadenopathy in the chest. Atelectasis in both lung bases. No  pneumothorax. No pleural effusions.  Review of the MIP images confirms the above findings.  CTA ABDOMEN AND PELVIS FINDINGS  Aortic dissection extends from the thoracic aorta into the abdominal aorta down to the level below the renal arteries. Dissection extends into the base of the superior mesenteric artery. The false lumen appears to be larger in size and have more brisk flow than the true lumen. The celiac axis and left renal artery appear to arise from the false lumen. The superior mesenteric artery and right renal artery appear to arise from the true lumen. Distal abdominal aorta and aortic bifurcation appear intact and patent with mild calcification. No aortic aneurysm. Asymmetry of renal nephrograms with no flow demonstrated to the left kidney. 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  The liver, spleen, gallbladder, pancreas, adrenal glands, inferior vena cava, and retroperitoneal lymph nodes are unremarkable. Stomach, small bowel, and colon are decompressed. No free air or free fluid in the abdomen.  Pelvis: Bladder wall is not thickened. Prostate gland is enlarged. No free or loculated pelvic fluid collections. No pelvic mass or lymphadenopathy. Appendix is not identified.  Bones: Normal alignment of the thoracic and lumbar spine. No destructive bone lesions appreciated.  Review of the MIP images confirms the above findings.  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 Devascularization of the left kidney. Left renal artery origin and celiac axis origin appear from the false lumen. Superior mesenteric artery and right renal artery origins appear from the true lumen.  These results were called by telephone at the time of interpretation on 04/23/2014 at 10:43 pm to Dr. Quintella Reichert , who verbally acknowledged these results.   Electronically Signed   By: Lucienne Capers  M.D.   On: 04/23/2014 22:45         Subjective: Patient complains of some upper abdominal discomfort. No bowel movement in 1 week. No nausea, vomiting, diarrhea. Denies any fevers, chills, chest pain, shortness of breath   Objective: Filed Vitals:   05/02/14 0600 05/02/14 0613 05/02/14 0700 05/02/14 0722  BP: 114/53  138/78 132/80  Pulse: 75  76 78  Temp:      TempSrc:      Resp: 19  22 22   Height:      Weight:  68 kg (149 lb 14.6 oz)    SpO2: 92%  91% 92%    Intake/Output Summary (Last 24 hours) at 05/02/14 0817 Last data filed at 05/02/14 [id number]  Gross per 24 hour  Intake   1560 ml  Output   1800 ml  Net   -240 ml   Weight change: -8.3 kg (-18 lb 4.8 oz) Exam:   General:  Pt is alert, follows commands appropriately, not in acute distress  HEENT: No icterus, No thrush,[place]/AT  Cardiovascular: RRR, S1/S2, no rubs, no gallops  Respiratory: Bibasilar crackles. No wheeze   Abdomen: Soft/+BS, non tender, non distended, no guarding  Extremities: 1+LE edema, No lymphangitis, No petechiae, No rashes, no synovitis  Data Reviewed: Basic Metabolic Panel:  Recent Labs Lab 04/28/14 0445 04/29/14 0438 04/30/14 0400 05/01/14 0258 05/02/14 0300  NA 134* 134*  134* 133* 133*  K 3.6 2.9* 2.5* 3.1* 3.6  CL 103 101 91* 94* 97  CO2 20 24 29 26  32  GLUCOSE 117* 120* 90 99 102*  BUN 34* 27* 24* 24* 23  CREATININE 2.98* 2.70* 2.65* 2.48* 2.31*  CALCIUM 8.7 8.7 8.5 8.8 8.8  MG  --   --   --  2.0 2.1  PHOS  --   --   --  3.5 3.2   Liver Function Tests:  Recent Labs Lab 04/26/14 0345  AST 22  ALT 20  ALKPHOS 51  BILITOT 1.0  PROT 5.9*  ALBUMIN 3.1*   No results for input(s): LIPASE, AMYLASE in the last 168 hours. No results for input(s): AMMONIA in the last 168 hours. CBC:  Recent Labs Lab 04/26/14 0345 04/27/14 0318 04/28/14 0445 04/30/14 1516 05/01/14 0258 05/02/14 0300  WBC 8.3 8.4 6.7 6.7 6.5 7.4  NEUTROABS 6.2  --   --   --   --  3.9  HGB 10.8* 9.6*  9.5* 9.6* 9.4* 9.5*  HCT 32.2* 28.5* 28.6* 28.6* 27.5* 27.7*  MCV 91.0 88.5 90.5 87.2 87.3 87.1  PLT 88* 96* 98* 125* 127* 162   Cardiac Enzymes: No results for input(s): CKTOTAL, CKMB, CKMBINDEX, TROPONINI in the last 168 hours. BNP: Invalid input(s): POCBNP CBG:  Recent Labs Lab 04/26/14 1149  GLUCAP 123*    Recent Results (from the past 240 hour(s))  MRSA PCR Screening     Status: None   Collection Time: 04/24/14 12:59 AM  Result Value Ref Range Status   MRSA by PCR NEGATIVE NEGATIVE Final    Comment:        The GeneXpert MRSA Assay (FDA approved for NASAL specimens only), is one component of a comprehensive MRSA colonization surveillance program. It is not intended to diagnose MRSA infection nor to guide or monitor treatment for MRSA infections.      Scheduled Meds: . amLODipine  10 mg Oral Daily  . bisacodyl  10 mg Oral Once  . cephALEXin  500 mg Oral Q12H  . cloNIDine  0.2 mg Oral TID  . fluticasone  1 spray Each Nare BID  . hydrALAZINE  100 mg Oral TID  . labetalol  300 mg Oral BID  . minoxidil  2.5 mg Oral BID  . nitroGLYCERIN  0.4 mg Transdermal Daily   Continuous Infusions: . sodium chloride Stopped (04/29/14 1000)     Kinsler Soeder, DO  [facility] Pager [phone]  If 7PM-7AM, please contact night-coverage [email or web address] Password [facility] 05/02/2014, 8:17 AM   LOS: 9 days

## 2014-05-03 ENCOUNTER — Telehealth: Payer: Self-pay | Admitting: *Deleted

## 2014-05-03 DIAGNOSIS — R0902 Hypoxemia: Secondary | ICD-10-CM

## 2014-05-03 LAB — BASIC METABOLIC PANEL
Anion gap: 10 (ref 5–15)
BUN: 22 mg/dL (ref 6–23)
CO2: 26 mmol/L (ref 19–32)
Calcium: 8.8 mg/dL (ref 8.4–10.5)
Chloride: 98 mmol/L (ref 96–112)
Creatinine, Ser: 2.27 mg/dL — ABNORMAL HIGH (ref 0.50–1.35)
GFR calc Af Amer: 40 mL/min — ABNORMAL LOW (ref >90)
GFR, EST NON AFRICAN AMERICAN: 34 mL/min — AB (ref >90)
GLUCOSE: 100 mg/dL — AB (ref 70–99)
POTASSIUM: 3.8 mmol/L (ref 3.5–5.1)
SODIUM: 134 mmol/L — AB (ref 135–145)

## 2014-05-03 LAB — CBC WITH DIFFERENTIAL/PLATELET
BASOS ABS: 0 10*3/uL (ref 0.0–0.1)
Basophils Relative: 0 % (ref 0–1)
EOS PCT: 3 % (ref 0–5)
Eosinophils Absolute: 0.2 10*3/uL (ref 0.0–0.7)
HEMATOCRIT: 29.7 % — AB (ref 39.0–52.0)
Hemoglobin: 9.9 g/dL — ABNORMAL LOW (ref 13.0–17.0)
Lymphocytes Relative: 23 % (ref 12–46)
Lymphs Abs: 1.8 10*3/uL (ref 0.7–4.0)
MCH: 29.6 pg (ref 26.0–34.0)
MCHC: 33.3 g/dL (ref 30.0–36.0)
MCV: 88.7 fL (ref 78.0–100.0)
MONOS PCT: 20 % — AB (ref 3–12)
Monocytes Absolute: 1.5 10*3/uL — ABNORMAL HIGH (ref 0.1–1.0)
NEUTROS ABS: 4.2 10*3/uL (ref 1.7–7.7)
Neutrophils Relative %: 54 % (ref 43–77)
PLATELETS: 190 10*3/uL (ref 150–400)
RBC: 3.35 MIL/uL — AB (ref 4.22–5.81)
RDW: 14.8 % (ref 11.5–15.5)
WBC: 7.7 10*3/uL (ref 4.0–10.5)

## 2014-05-03 LAB — PHOSPHORUS: PHOSPHORUS: 4 mg/dL (ref 2.3–4.6)

## 2014-05-03 LAB — MAGNESIUM: Magnesium: 2.1 mg/dL (ref 1.5–2.5)

## 2014-05-03 MED ORDER — LABETALOL HCL 300 MG PO TABS: 300.0000 mg | ORAL_TABLET | Freq: Two times a day (BID) | ORAL | Status: DC

## 2014-05-03 MED ORDER — CLONIDINE HCL 0.2 MG PO TABS
0.2000 mg | ORAL_TABLET | Freq: Three times a day (TID) | ORAL | Status: DC
Start: 2014-05-03 — End: 2014-12-14

## 2014-05-03 MED ORDER — NITROGLYCERIN 0.4 MG/HR TD PT24: 0.4000 mg | MEDICATED_PATCH | Freq: Every day | TRANSDERMAL | Status: DC

## 2014-05-03 MED ORDER — MINOXIDIL 2.5 MG PO TABS: 2.5000 mg | ORAL_TABLET | Freq: Two times a day (BID) | ORAL | Status: DC

## 2014-05-03 MED ORDER — HYDRALAZINE HCL 100 MG PO TABS: 100.0000 mg | ORAL_TABLET | Freq: Three times a day (TID) | ORAL | Status: DC

## 2014-05-03 MED ORDER — CEPHALEXIN 500 MG PO CAPS: 500.0000 mg | ORAL_CAPSULE | Freq: Two times a day (BID) | ORAL | Status: DC

## 2014-05-03 MED ORDER — AMLODIPINE BESYLATE 10 MG PO TABS: 10.0000 mg | ORAL_TABLET | Freq: Every day | ORAL | Status: DC

## 2014-05-03 NOTE — Progress Notes (Signed)
Admit: 04/23/2014 LOS: 10  31M CKD (BL SCr ~2), hx/o uncontrolled HTN admit with type B aortic dissection, L renal infarcted, and contrast nepropathy  Subjective:  Good UOP, creatinine down some again- BP excellent on all oral pills Now on room air Plan is for dissection to be re-evald in a month- pt with concerns that he can keep up with meds- wants to stay in the hospital  02/17 0701 - 02/18 0700 In: 360 [P.O.:360] Out: 2050 [Urine:2050]  Filed Weights   05/01/14 0531 05/02/14 0613 05/03/14 0422  Weight: 76.3 kg (168 lb 3.4 oz) 68 kg (149 lb 14.6 oz) 73.4 kg (161 lb 13.1 oz)    Scheduled Meds: . amLODipine  10 mg Oral Daily  . cephALEXin  500 mg Oral Q12H  . cloNIDine  0.2 mg Oral TID  . docusate sodium  100 mg Oral BID  . fluticasone  1 spray Each Nare BID  . hydrALAZINE  100 mg Oral TID  . labetalol  300 mg Oral BID  . minoxidil  2.5 mg Oral BID  . nitroGLYCERIN  0.4 mg Transdermal Daily   Continuous Infusions: . sodium chloride Stopped (04/29/14 1000)   PRN Meds:.sodium chloride, cloNIDine, heparin, hydrALAZINE, HYDROmorphone (DILAUDID) injection, sorbitol  Current Labs: reviewed    Physical Exam:  Blood pressure 134/77, pulse 74, temperature 99 F (37.2 C), temperature source Oral, resp. rate 16, height 6' (1.829 m), weight 73.4 kg (161 lb 13.1 oz), SpO2 96 %. Sleeping - on  Diminished BS in bases, vesicular S/nt/nd abdomen RRR, nl s1s2 no rub No edema  A/P 1. Renal- has baseline CKD with a creatinine around 2.  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  Acute component of renal failure has been improving slowly for the last several days.  He has not required dialysis.  Possibly approaching as good as will get renal wise, but as long as creatinine coming down- would continue to watch.  Once hits nadir and stays that would be the sweet spot renal wise for surgical intervention 1. Type B aortic dissection- blood pressure is very  well controlled on a complicated PO regimen.  Decision made to re evaluate disscection in a month to determine if surgery is something he needs. 2. HTN 1.  Great control-  amlodipine 10, clonidine 0.2 TID, hydralazine 100 TID, labetalol 300 BID, minoxidil 2.5 BID and prn cardene/hydralazine- has not needed at least last 72 hours- no changes for now- have sincere doubts as to whether patient can keep up with this regimen as OP- I talked to him about how vitally important this is.  I also told him he does not qualify for continued inpatient care of his blood pressure if he is on all pills  3. SOB; effusions:  better- on room air 4. Hypokalemia- better, no repletion needed today 5. Anemia- situational and CKD- supportive care for now- no ESA 6. Dispo- since no sugical intervention planned and blood pressure controlled feel could be discharged home- told him to set alarm on phone and become familiar with blood pressure pills so he can keep up with it. Would have SW make sure he can obtain all meds.  I will arrange follow up with [facility]  Renal will sign off, call with questions   Ronny Ruddell A   05/03/2014, 8:10 AM   Recent Labs Lab 05/01/14 0258 05/02/14 0300 05/03/14 0250  NA 133* 133* 134*  K 3.1* 3.6 3.8  CL 94* 97 98  CO2 26 32 26  GLUCOSE 99 102* 100*  BUN 24* 23 22  CREATININE 2.48* 2.31* 2.27*  CALCIUM 8.8 8.8 8.8  PHOS 3.5 3.2 4.0    Recent Labs Lab 05/01/14 0258 05/02/14 0300 05/03/14 0250  WBC 6.5 7.4 7.7  NEUTROABS  --  3.9 4.2  HGB 9.4* 9.5* 9.9*  HCT 27.5* 27.7* 29.7*  MCV 87.3 87.1 88.7  PLT 127* 162 190

## 2014-05-03 NOTE — Progress Notes (Signed)
Note: RN paged this NP secondary to pt desatting into the 80s on RA. Placed on 2L and still only upper 80s. Bumped up to 4L. This was a sudden change as pt has been satting normally all night, even when sleeping.  NP to bedside. S: Denies CP, abd pain or SOB. States he feels fine. O: Appears well. No respiratory distress. No increased WOB or tachypnea.  O2 sat probe changed and pt satting normally now.  A/P: 1. Acute hypoxia-FALSE reading. [name] exam not matching with machine. Now, O2 sat normal with changing of sat probe. Cancelled ABG and CXR. Wean back to room air.  KJKG, NP [facility]

## 2014-05-03 NOTE — Telephone Encounter (Signed)
-----   Message from Corinna Lines sent at 05/02/2014 10:05 AM EST ----- Regarding: [place]  The above patient has been called by [facility] 3 times and has not returned their calls to schedule an appointment.  [facility]

## 2014-05-03 NOTE — Progress Notes (Signed)
Medicare Important Message given? YES  (If response is "NO", the following Medicare IM given date fields will be blank)  Date Medicare IM given: 05/03/14 Medicare IM given by:  Bernadette Gores  

## 2014-05-03 NOTE — Discharge Summary (Signed)
Physician Discharge Summary  [name] [id number] DOB: 1970-04-17 DOA: 04/23/2014  PCP: [name], MD  Admit date: 04/23/2014 Discharge date: 05/03/2014  Recommendations for Outpatient Follow-up:  1. Pt will need to follow up with PCP in 2 weeks post discharge 2. 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 acute component of renal failure has been improving slowly for the last several days. -renal feels once hits nadir and stays that would be the sweet spot renal wise for surgical intervention Type B Aortic Dissection -appreciate VVS followup -operative repair to be determined by VVS -Renal feels pt is hemodynamically stable presently -continue po anti-HTN regimen -On 05/02/2014 Dr. Oneida Alar saw the patient did not feel that the patient needed emergent surgical intervention during this hospitalization given the increased risk of morbidity and mortality -Dr. Oneida Alar recommended controlling the patient's blood pressure and repeat CT angiogram of the chest and abdomen in 1 month; he also wanted to allow time for the patient's renal function to continue to recover -pt will follow up with Renal and VVS in 2-4 weeks Pleural Effusions -presently stable on RA -03/28/14 echocardiogram shows EF 50%, no WMA -Electrocardiogram showed normal sinus rhythm no acute ischemic changes. Thrombocytopenia -Platelets improving -Patient likely had a degree of myelosuppression secondary to acute medical illness -no bleeding  complications -Hgb remains stable Anemia of CKD  -Supportive care for now  -Continue monitor hemoglobin  Constipation -colace and bisacodyl -pt tolerating diet Phlebitis -He developed some phlebitis of his left upper extremity. -Improved with cephalexin -Patient will go home with cephalexin for 3 additional days which will finish one-week therapy  Discharge Condition: stable  Disposition: home Follow-up Information    Follow up with GOLDSBOROUGH,KELLIE A, MD In 2 weeks.   Specialty:  Nephrology   Contact information:   [place] [id number] [phone]       Follow up with Ramond Craver, [place], MD In 2 weeks.   Specialty:  Vascular Surgery   Contact information:   [address] [place] [address] [phone]       Diet:heart healthy Wt Readings from Last 3 Encounters:  05/03/14 73.4 kg (161 lb 13.1 oz)  03/28/14 68.448 kg (150 lb 14.4 oz)  02/23/14 73.936 kg (163 lb)    History of present illness:  44 year old male presented to [facility] ED 2/8 c/o stabbing chest pain/back. He has history of multiple admissions for uncontrolled HTN. CTA shows type B aortic dissection arising just distal to the origin of the left subclavian artery and extending into the abdominal aorta below the level of the renal arteries, Devascularization of the left kidney. Left renal artery origin and celiac axis origin appear from the false lumen . The patient was admitted to the critical care service. He was started on a Cardene drip.the patient was subsequently transitioned to oral antihypertensive medications and his Cardene drip was weaned off.   He was tranferred to hospitalist service on 2/18.  The patient was initially found to have acute pulmonary edema, but has been since  weaned to room air. Vascular surgery and nephrology continued to follow the patient. The patient's renal function gradually improved to 2.27 on day of discharge. Vascular surgery continued to follow the patient; given  the patient's organ damage from his hypertension and dissection, they felt that there was increased morbidity and mortality in surgical intervention at this time. They recommended follow-up CT angiogram of the chest abdomen and pelvis in one month. The patient was discharged home on his antihypertensive regimen of amlodipine 10 mg daily, clonidine 0.2 mg 3 times a day, hydralazine 100 mg 3 times a day, labetalol 300 mg twice a day, minoxidil 2.5 mg twice a day, and nitroglycerin patch 0.4 mg daily    Consultants: VVS TCTS Nephrology CCM  Discharge Exam: Filed Vitals:   05/03/14 1400  BP: 103/47  Pulse: 74  Temp:   Resp:    Filed Vitals:   05/03/14 0940 05/03/14 1100 05/03/14 1251 05/03/14 1400  BP: 118/75 114/56  103/47  Pulse:  74  74  Temp:   99.1 F (37.3 C)   TempSrc:   Oral   Resp:      Height:      Weight:      SpO2:  100%  95%   General: A&O x 3, NAD, pleasant, cooperative Cardiovascular: RRR, no rub, no gallop, no S3 Respiratory: CTAB, no wheeze, no rhonchi Abdomen:soft, nontender, nondistended, positive bowel sounds Extremities: No edema, No lymphangitis, no petechiae  Discharge Instructions  Discharge Instructions    Diet - low sodium heart healthy    Complete by:  As directed      Increase activity slowly    Complete by:  As directed             Medication List    STOP taking these medications        carvedilol 25 MG tablet  Commonly known as:  COREG     furosemide 20 MG tablet  Commonly known as:  LASIX     lisinopril 20 MG tablet  Commonly known as:  PRINIVIL,ZESTRIL     spironolactone 25 MG tablet  Commonly known as:  ALDACTONE      TAKE these medications        amLODipine 10 MG tablet  Commonly known as:  NORVASC  Take 1 tablet (10 mg total) by mouth daily.     cephALEXin 500 MG capsule  Commonly known as:  KEFLEX  Take 1 capsule (500 mg total) by mouth every 12 (twelve) hours.     cloNIDine 0.2 MG tablet  Commonly known as:   CATAPRES  Take 1 tablet (0.2 mg total) by mouth 3 (three) times daily.     cyclobenzaprine 5 MG tablet  Commonly known as:  FLEXERIL  Take 1 tablet (5 mg total) by mouth 3 (three) times daily as needed for muscle spasms (please give 1st dose now).     hydrALAZINE 100 MG tablet  Commonly known as:  APRESOLINE  Take 1 tablet (100 mg total) by mouth 3 (three) times daily.     HYDROcodone-acetaminophen 5-325 MG per tablet  Commonly known as:  NORCO/VICODIN  Take 1 tablet by mouth every 6 (six) hours as needed for moderate pain or severe pain.     labetalol 300 MG tablet  Commonly known as:  NORMODYNE  Take 1 tablet (300 mg total) by mouth 2 (two) times daily.     minoxidil 2.5 MG tablet  Commonly known as:  LONITEN  Take 1 tablet (2.5  mg total) by mouth 2 (two) times daily.     nitroGLYCERIN 0.4 mg/hr patch  Commonly known as:  NITRODUR - Dosed in mg/24 hr  Place 1 patch (0.4 mg total) onto the skin daily.         The results of significant diagnostics from this hospitalization (including imaging, microbiology, ancillary and laboratory) are listed below for reference.    Significant Diagnostic Studies: Dg Chest Port 1 View  05/01/2014   CLINICAL DATA:  Hypoxia, thoracoabdominal aortic dissection, acute renal failure. , history of aortic valve disease, CHF, and tobacco use.  EXAM: PORTABLE CHEST - 1 VIEW  COMPARISON:  Portable chest x-ray of April 28, 2014  FINDINGS: The lungs are adequately inflated. The pulmonary interstitial markings have improved sincethe earlier study. Left lower lobe atelectasis or infiltrate persists but has improved slightly. Small bilateral pleural effusions are suspected but are less conspicuous today. The cardiac silhouette remains enlarged. The mediastinum is widened but stable. The left internal jugular venous catheter has been removed. The bony thorax exhibits no acute abnormality.  IMPRESSION: Slight interval improvement in the left lower lobe  atelectasis or pneumonia. Small pleural effusions are less conspicuous. Mild mediastinal widening is unchanged.   Electronically Signed   By: Hayden Mabin  [place]   On: 05/01/2014 07:35   Dg Chest Port 1 View  04/28/2014   CLINICAL DATA:  Respiratory failure, shortness of breath.  EXAM: PORTABLE CHEST - 1 VIEW  COMPARISON:  Chest radiograph 04/26/2014  FINDINGS: Left internal jugular central venous catheter tip projects towards the right lateral wall of the superior vena cava. Stable cardiomegaly. Slight interval improvement bilateral heterogeneous pulmonary opacities. Small bilateral layering pleural effusions.  IMPRESSION: Left IJ central venous catheter tip is present with tip directed towards the lateral wall of the superior vena cava. Consider repositioning as clinically indicated.  Slight improvement in bilateral heterogeneous pulmonary opacities.  Persistent layering bilateral pleural effusions.  These results will be called to the ordering clinician or representative by the Radiologist Assistant, and communication documented in the PACS or zVision Dashboard.   Electronically Signed   By: Lovey Newcomer M.D.   On: 04/28/2014 08:35   Dg Chest Port 1 View  04/27/2014   CLINICAL DATA:  Respiratory failure.  Central line placement.  EXAM: PORTABLE CHEST - 1 VIEW  COMPARISON:  04/26/2014.  FINDINGS: Left IJ line noted with tip projected over superior vena cava. B stone hilar structures are stable. Stable cardiomegaly. Diffuse progressive bilateral pulmonary alveolar infiltrates. These could be related to pulmonary edema or pneumonia. Bilateral effusions are present. No pneumothorax.  IMPRESSION: 1. Left IJ line with tip projected over superior vena cava. 2. Progressive bilateral pulmonary alveolar infiltrates consistent with pulmonary edema and/or pneumonia. Associated bilateral effusions. 3. Cardiomegaly   Electronically Signed   By: Marcello Moores  [place]   On: 04/27/2014 07:15   Dg Chest Port 1 View  04/26/2014    CLINICAL DATA:  Thoracic aortic dissection, pulmonary edema.  EXAM: PORTABLE CHEST - 1 VIEW  COMPARISON:  Portable chest x-ray of April 25, 2014  FINDINGS: The lungs are adequately inflated. Increasing density at the right lung base is consistent with atelectasis. A small right pleural effusion has developed. On the left there is persistent lower lobe atelectasis. A small left pleural effusion is suspected as well. The cardiac silhouette is enlarged. The central pulmonary vascularity is prominent but there is no definite cephalization. The mediastinum is normal in width.  IMPRESSION: Increasing bibasilar atelectasis and/or pneumonia. Small bilateral  pleural effusions are present.   Electronically Signed   By: Mykal Kirchman  [place]   On: 04/26/2014 07:40   Dg Chest Port 1 View  04/25/2014   CLINICAL DATA:  Hypoxia and respiratory failure  EXAM: PORTABLE CHEST - 1 VIEW  COMPARISON:  04/24/2014  FINDINGS: Cardiac shadow remains mildly enlarged. Mild right basilar atelectasis is again seen. Increasing left basilar infiltrate is noted with associated effusion. No acute bony abnormality is noted.  IMPRESSION: Increasing left basilar infiltrate with associated effusion when compared with the prior exam.   Electronically Signed   By: Inez Catalina M.D.   On: 04/25/2014 08:23   Dg Chest Port 1 View  04/24/2014   CLINICAL DATA:  Known aortic dissection. Assess for underlying airspace disease. Subsequent encounter.  EXAM: PORTABLE CHEST - 1 VIEW  COMPARISON:  CTA of the chest performed 04/23/2014  FINDINGS: Bibasilar airspace opacities are noted, with underlying vascular congestion, concerning for mild pulmonary edema. This is mostly new from the prior CTA. No definite pleural effusion or pneumothorax is seen.  The cardiomediastinal silhouette is enlarged. No acute osseous abnormalities are identified.  IMPRESSION: 1. Bibasilar airspace opacities, with underlying vascular congestion, concerning for mild pulmonary edema, mostly  new from the prior study. 2. Enlarged cardiomediastinal silhouette, as previously noted.   Electronically Signed   By: Garald Balding M.D.   On: 04/24/2014 07:03   Ct Angio Chest Aorta W/cm &/or Wo/cm  04/23/2014   CLINICAL DATA:  Chest and back pain.  EXAM: CT ANGIOGRAPHY CHEST, ABDOMEN AND PELVIS  TECHNIQUE: Multidetector CT imaging through the chest, abdomen and pelvis was performed using the standard protocol during bolus administration of intravenous contrast. Multiplanar reconstructed images and MIPs were obtained and reviewed to evaluate the vascular anatomy.  CONTRAST:  145mL OMNIPAQUE IOHEXOL 350 MG/ML SOLN  COMPARISON:  CT chest 09/08/2010.  CT abdomen and pelvis 01/24/2012  FINDINGS: CTA CHEST FINDINGS  Noncontrast images of the chest demonstrate no significant aortic or Coronary artery calcification. No evidence of intramural hematoma.  Images obtained during arterial phase of contrast injection demonstrate aortic dissection arising in the posterior aortic arch just distal to the origin of the left subclavian artery. The dissection extends throughout the descending thoracic aorta and into the abdomen. Flow is demonstrated in both the true and false lumens. No evidence of dissection in the ascending aorta. Dilated ascending aorta with maximal diameter of 4.2 cm. No abnormal mesenteric fluid collections. Great vessel origins are patent without evidence of dissection.  Diffuse cardiac enlargement. Esophagus is decompressed. No significant lymphadenopathy in the chest. Atelectasis in both lung bases. No pneumothorax. No pleural effusions.  Review of the MIP images confirms the above findings.  CTA ABDOMEN AND PELVIS FINDINGS  Aortic dissection extends from the thoracic aorta into the abdominal aorta down to the level below the renal arteries. Dissection extends into the base of the superior mesenteric artery. The false lumen appears to be larger in size and have more brisk flow than the true lumen. The  celiac axis and left renal artery appear to arise from the false lumen. The superior mesenteric artery and right renal artery appear to arise from the true lumen. Distal abdominal aorta and aortic bifurcation appear intact and patent with mild calcification. No aortic aneurysm. Asymmetry of renal nephrograms with no flow demonstrated to the left kidney. 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  The liver, spleen, gallbladder, pancreas, adrenal glands, inferior vena cava, and retroperitoneal lymph nodes are unremarkable. Stomach, small bowel, and  colon are decompressed. No free air or free fluid in the abdomen.  Pelvis: Bladder wall is not thickened. Prostate gland is enlarged. No free or loculated pelvic fluid collections. No pelvic mass or lymphadenopathy. Appendix is not identified.  Bones: Normal alignment of the thoracic and lumbar spine. No destructive bone lesions appreciated.  Review of the MIP images confirms the above findings.  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 Devascularization of the left kidney. Left renal artery origin and celiac axis origin appear from the false lumen. Superior mesenteric artery and right renal artery origins appear from the true lumen.  These results were called by telephone at the time of interpretation on 04/23/2014 at 10:43 pm to Dr. Quintella Reichert , who verbally acknowledged these results.   Electronically Signed   By: Lucienne Capers M.D.   On: 04/23/2014 22:45   Ct Cta Abd/pel W/cm &/or W/o Cm  04/23/2014   CLINICAL DATA:  Chest and back pain.  EXAM: CT ANGIOGRAPHY CHEST, ABDOMEN AND PELVIS  TECHNIQUE: Multidetector CT imaging through the chest, abdomen and pelvis was performed using the standard protocol during bolus administration of intravenous contrast. Multiplanar reconstructed images and MIPs were obtained and reviewed to  evaluate the vascular anatomy.  CONTRAST:  169mL OMNIPAQUE IOHEXOL 350 MG/ML SOLN  COMPARISON:  CT chest 09/08/2010.  CT abdomen and pelvis 01/24/2012  FINDINGS: CTA CHEST FINDINGS  Noncontrast images of the chest demonstrate no significant aortic or Coronary artery calcification. No evidence of intramural hematoma.  Images obtained during arterial phase of contrast injection demonstrate aortic dissection arising in the posterior aortic arch just distal to the origin of the left subclavian artery. The dissection extends throughout the descending thoracic aorta and into the abdomen. Flow is demonstrated in both the true and false lumens. No evidence of dissection in the ascending aorta. Dilated ascending aorta with maximal diameter of 4.2 cm. No abnormal mesenteric fluid collections. Great vessel origins are patent without evidence of dissection.  Diffuse cardiac enlargement. Esophagus is decompressed. No significant lymphadenopathy in the chest. Atelectasis in both lung bases. No pneumothorax. No pleural effusions.  Review of the MIP images confirms the above findings.  CTA ABDOMEN AND PELVIS FINDINGS  Aortic dissection extends from the thoracic aorta into the abdominal aorta down to the level below the renal arteries. Dissection extends into the base of the superior mesenteric artery. The false lumen appears to be larger in size and have more brisk flow than the true lumen. The celiac axis and left renal artery appear to arise from the false lumen. The superior mesenteric artery and right renal artery appear to arise from the true lumen. Distal abdominal aorta and aortic bifurcation appear intact and patent with mild calcification. No aortic aneurysm. Asymmetry of renal nephrograms with no flow demonstrated to the left kidney. 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  The liver, spleen, gallbladder, pancreas, adrenal glands, inferior vena cava, and retroperitoneal lymph nodes are unremarkable. Stomach, small  bowel, and colon are decompressed. No free air or free fluid in the abdomen.  Pelvis: Bladder wall is not thickened. Prostate gland is enlarged. No free or loculated pelvic fluid collections. No pelvic mass or lymphadenopathy. Appendix is not identified.  Bones: Normal alignment of the thoracic and lumbar spine. No destructive bone lesions appreciated.  Review of the MIP images confirms the above findings.  IMPRESSION: Type B  aortic dissection arising just distal to the origin of the left subclavian artery and extending into the abdominal aorta below the level of the renal arteries. Devascularization of the left kidney. Left renal artery origin and celiac axis origin appear from the false lumen. Superior mesenteric artery and right renal artery origins appear from the true lumen.  These results were called by telephone at the time of interpretation on 04/23/2014 at 10:43 pm to Dr. Quintella Reichert , who verbally acknowledged these results.   Electronically Signed   By: Lucienne Capers M.D.   On: 04/23/2014 22:45     Microbiology: Recent Results (from the past 240 hour(s))  MRSA PCR Screening     Status: None   Collection Time: 04/24/14 12:59 AM  Result Value Ref Range Status   MRSA by PCR NEGATIVE NEGATIVE Final    Comment:        The GeneXpert MRSA Assay (FDA approved for NASAL specimens only), is one component of a comprehensive MRSA colonization surveillance program. It is not intended to diagnose MRSA infection nor to guide or monitor treatment for MRSA infections.      Labs: Basic Metabolic Panel:  Recent Labs Lab 04/29/14 0438 04/30/14 0400 05/01/14 0258 05/02/14 0300 05/03/14 0250  NA 134* 134* 133* 133* 134*  K 2.9* 2.5* 3.1* 3.6 3.8  CL 101 91* 94* 97 98  CO2 24 29 26  32 26  GLUCOSE 120* 90 99 102* 100*  BUN 27* 24* 24* 23 22  CREATININE 2.70* 2.65* 2.48* 2.31* 2.27*  CALCIUM 8.7 8.5 8.8 8.8 8.8  MG  --   --  2.0 2.1 2.1  PHOS  --   --  3.5 3.2 4.0   Liver Function  Tests: No results for input(s): AST, ALT, ALKPHOS, BILITOT, PROT, ALBUMIN in the last 168 hours. No results for input(s): LIPASE, AMYLASE in the last 168 hours. No results for input(s): AMMONIA in the last 168 hours. CBC:  Recent Labs Lab 04/28/14 0445 04/30/14 1516 05/01/14 0258 05/02/14 0300 05/03/14 0250  WBC 6.7 6.7 6.5 7.4 7.7  NEUTROABS  --   --   --  3.9 4.2  HGB 9.5* 9.6* 9.4* 9.5* 9.9*  HCT 28.6* 28.6* 27.5* 27.7* 29.7*  MCV 90.5 87.2 87.3 87.1 88.7  PLT 98* 125* 127* 162 190   Cardiac Enzymes: No results for input(s): CKTOTAL, CKMB, CKMBINDEX, TROPONINI in the last 168 hours. BNP: Invalid input(s): POCBNP CBG: No results for input(s): GLUCAP in the last 168 hours.  Time coordinating discharge:  Greater than 30 minutes  Signed:  Giovana Faciane, DO [facility] Pager: [phone] 05/03/2014, 3:27 PM

## 2014-05-03 NOTE — Progress Notes (Signed)
Pt desat to mid 33s, placed on 6L [place], noted to have clear to diminished breath sounds. Paged provider, orders received for ABG and CXR.   At 0547, pulse ox replaced and showed O2 sats in mid 90s, orders for CXR and ABG d/c'd. Pt in no apparent distress. Will continue to monitor.

## 2014-05-03 NOTE — Progress Notes (Signed)
Pt without chest pain.  Renal function continues to improve.  Hgb slightly improved.  From vascular standpoint, will repeat CTA in one month.  Continue strict BP control.   F/u with Dr. Oneida Alar in 4 weeks.  Will sign off.  Call if needed.   Leontine Locket, PA-C 05/03/2014 8:03 AM

## 2014-05-03 NOTE — Progress Notes (Signed)
Pt d/c home per MD order, meds called to pharmacy pt educated, pt verbalized understanding of d/c, Pt VSS, d/c instructions given, all questions answered

## 2014-05-03 NOTE — Consult Note (Signed)
[facility]  Subjective  - wants to go home,  Chest pain present but improved, still complains of no bm   Objective 142/85 74 99 F (37.2 C) (Oral) 16 96%  Intake/Output Summary (Last 24 hours) at 05/03/14 0758 Last data filed at 05/03/14 0600  Gross per 24 hour  Intake    360 ml  Output   1650 ml  Net  -1290 ml   Abdomen soft non distended 2+ DP pulses bilaterally Motor LE 5/5  Assessment/Planning: Type B dissection end organ function unchanged feet perfused chest pain improved Renal function still not normal but improving Overall fairly stable Currently will want to wait for kidney function to be as good as possible before considering repair Will plan to repeat CT in one month as outpt Hopefully BP will be improved as outpt. But this is not an indication to fix his dissection early as the morbidity and mortality will be significant Will sign off at this point  Ewing,Andre E 05/03/2014 7:58 AM --  Laboratory Lab Results:  Recent Labs  05/02/14 0300 05/03/14 0250  WBC 7.4 7.7  HGB 9.5* 9.9*  HCT 27.7* 29.7*  PLT 162 190   BMET  Recent Labs  05/02/14 0300 05/03/14 0250  NA 133* 134*  K 3.6 3.8  CL 97 98  CO2 32 26  GLUCOSE 102* 100*  BUN 23 22  CREATININE 2.31* 2.27*  CALCIUM 8.8 8.8    COAG Lab Results  Component Value Date   INR 1.11 04/24/2014   INR 1.05 03/27/2014   INR 1.18 05/23/2011   No results found for: PTT

## 2014-05-15 ENCOUNTER — Other Ambulatory Visit: Payer: Self-pay | Admitting: *Deleted

## 2014-05-15 ENCOUNTER — Telehealth: Payer: Self-pay | Admitting: Cardiology

## 2014-05-15 ENCOUNTER — Ambulatory Visit (INDEPENDENT_AMBULATORY_CARE_PROVIDER_SITE_OTHER): Payer: Medicare Other | Admitting: Physician Assistant

## 2014-05-15 ENCOUNTER — Encounter: Payer: Self-pay | Admitting: Physician Assistant

## 2014-05-15 VITALS — BP 110/70 | HR 75 | Ht 72.0 in | Wt 154.0 lb

## 2014-05-15 DIAGNOSIS — I110 Hypertensive heart disease with heart failure: Secondary | ICD-10-CM | POA: Diagnosis not present

## 2014-05-15 DIAGNOSIS — I359 Nonrheumatic aortic valve disorder, unspecified: Secondary | ICD-10-CM

## 2014-05-15 DIAGNOSIS — I7101 Dissection of thoracic aorta: Secondary | ICD-10-CM | POA: Diagnosis not present

## 2014-05-15 DIAGNOSIS — I429 Cardiomyopathy, unspecified: Secondary | ICD-10-CM

## 2014-05-15 DIAGNOSIS — I5042 Chronic combined systolic (congestive) and diastolic (congestive) heart failure: Secondary | ICD-10-CM

## 2014-05-15 DIAGNOSIS — I7103 Dissection of thoracoabdominal aorta: Secondary | ICD-10-CM

## 2014-05-15 DIAGNOSIS — I340 Nonrheumatic mitral (valve) insufficiency: Secondary | ICD-10-CM | POA: Diagnosis not present

## 2014-05-15 DIAGNOSIS — K921 Melena: Secondary | ICD-10-CM

## 2014-05-15 DIAGNOSIS — R5383 Other fatigue: Secondary | ICD-10-CM

## 2014-05-15 DIAGNOSIS — I43 Cardiomyopathy in diseases classified elsewhere: Secondary | ICD-10-CM

## 2014-05-15 DIAGNOSIS — N189 Chronic kidney disease, unspecified: Secondary | ICD-10-CM | POA: Diagnosis not present

## 2014-05-15 DIAGNOSIS — I10 Essential (primary) hypertension: Secondary | ICD-10-CM | POA: Diagnosis not present

## 2014-05-15 DIAGNOSIS — N184 Chronic kidney disease, stage 4 (severe): Secondary | ICD-10-CM | POA: Insufficient documentation

## 2014-05-15 DIAGNOSIS — I71012 Dissection of descending thoracic aorta: Secondary | ICD-10-CM

## 2014-05-15 NOTE — Telephone Encounter (Signed)
Pt not  Feeling well having hard time getting out of bed each day.  Pt having dark stools, not black, since being discharged.  Pt has no c/o cp, sob, or dyspnea.  Pt stated he "feels like I'm floating'. Pt has no way to check his BP at home.  Per the pt there is no abdominal pain or bloating. Pt has a decreased appetite.  Pt c/o of numbness and tingling in hands a feet.   Spoke with Flex for the day and recommended pt come into the office to be seen, pt agreed and schedule for 3 pm appointment today.

## 2014-05-15 NOTE — Progress Notes (Signed)
Cardiology Office Note   Date:  05/15/2014   ID:  [name], DOB December 13, 1970, MRN [id number]  PCP:  [name], MD  Cardiologist:  Dr. [name]    Chief Complaint  Patient presents with  . Fatigue     History of Present Illness: [name] is a 44 y.o. male with a hx of HTN, NICM likely related to HTN heart disease, tobacco and ETOH abuse, CKD, valvular heart disease.  Admitted in 05/2011 with hypertensive emergency c/b pulmonary edema.  He was diuresed.  Echo demonstrated EF 35-40% with diff HK and mod to severe MR.  DCM was felt to be due to HTN and ETOH abuse.  LHC was not done due to CKD.  Myoview was negative for ischemia or scar.  Admitted again in 6/15 with HTN emergency (ran out of medications).  Last echo in 02/2013 demonstrated EF 45-50% and mod AI and mild MR.  Last seen by Dr. [name] 11/2013.  He was out of his medications.  These were to be resumed and he was supposed to FU in 1 month.  Admitted 03/2014 with HTN emergency in the setting of non-adherence to medications.  Chest CTA demonstrated stable 4.2 cm ascending aortic aneurysm.   Patient left AMA when he did not receive Morphine for HAs.    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  Dissection arose just distal to the margin of the left subclavian and extended into the abdominal aorta below the level of the renal arteries. There was involvement of the left renal artery with infarction and resultant acute on chronic renal failure (peak creatinine 3.46) . He was followed by critical care, vascular surgery and nephrology.  Vascular surgery recommended improved blood pressure control and repeat CT in 1 month prior to proceeding with surgical repair.  Creatinine improved to 2.27 prior to discharge. He was discharged home on a regimen of amlodipine 10 mg, clonidine 0.2 mg 3 times a day, hydralazine 100 mg 3 times a day, labetalol 300 mg twice a day, minoxidil 2.5 mg twice  a day, nitroglycerin patch 0.4 mg daily. He has follow-up with nephrology and vascular surgery pending.    He called in today with complaints of fatigue.  He is added on to my schedule for evaluation.  The patient tells me that he felt weak in the hospital. This ha continued since discharge without significant improvement. There has been no worsening. He is dizzy at times. He notes some numbness and tingling in his feet and hands. This also was present in the hospital and has not changed. He denies syncope. He has chronic dyspnea. He is NYHA 2-2b. He denies orthopnea, PND or edema. He denies chest pain. He has chronic headaches.     Studies/Reports Reviewed Today:  2-D echocardiogram 03/2014 Severe LVH consistent with hypertrophic cardiomyopathy, EF 50%, normal wall motion Moderate AI Slight dilatation of aortic root (40 mm) Mild MR Mild RAE  Renal Artery [name] 03/28/14 No evidence of renal artery stenosis noted bilaterally.  Myoview 06/2011 No evidence of ischemia or infarction. Suspect nonischemic cardiomyopathy.  LV Ejection Fraction: 37%  Dg Chest Port 1 View  05/01/2014    IMPRESSION: Slight interval improvement in the left lower lobe atelectasis or pneumonia. Small pleural effusions are less conspicuous. Mild mediastinal widening is unchanged.   Electronically Signed   By: David  Martinique   On: 05/01/2014 07:35   Ct Angio Chest Aorta W/cm &/or Wo/cm  04/23/2014  IMPRESSION: Type B aortic dissection arising just distal to the origin of the left subclavian artery and extending into the abdominal aorta below the level of the renal arteries. Devascularization of the left kidney. Left renal artery origin and celiac axis origin appear from the false lumen. Superior mesenteric artery and right renal artery origins appear from the true lumen.  These results were called by telephone at the time of interpretation on 04/23/2014 at 10:43 pm to Dr. Quintella Reichert , who verbally acknowledged these results.    Electronically Signed   By: Lucienne Capers M.D.   On: 04/23/2014 22:45     Past Medical History: 1. H/o ankle fractures bilaterally 2. GERD 3. HTN: Negative urinary catecholeamine collection for pheochromocytoma.  4. CKD: Suspect hypertensive nephropathy.  5. Cardiomyopathy: Most likely due to heavy ETOH ingestion and uncontrolled HTN. 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 Lexiscan myoview (4/13): EF 37%, global hypokinesis, no ischemic or infarction. Echo (12/14) with EF 45-50%, diffuse hypokinesis, moderate LVH, mild MR, moderate AI.  6. ETOH abuse: Quit 3/13.  7. Active smoker. 8. Valvular heart disease: Echo in 3/13 showed moderate to severe eccentric MR and mild to moderate AI with prolapsing left coronary cusp. Echo 12/14 with mild MR and moderate AI.  9. Headache with Imdur.  Past Medical History  Diagnosis Date  . Hypertension     a. Hx of HTN urgency secondary to noncompliance. b. urinary metanephrine and catecholeamine levels normal 2013.  [name] Anxiety   . GERD (gastroesophageal reflux disease)   . Acute renal failure     June 2012 felt secondary to Toradol  . Chronic renal failure   . Chronic combined systolic and diastolic congestive heart failure     a. 05/2011: Adm with pulm edema/HTN urgency, EF 35-40% with diffuse hypokinesis and moderate to severe mitral regurgitation. Cardiomyopathy likely due to uncontrolled HTN and ETOH abuse - cath deferred due to renal insufficiency (felt due to uncontrolled HTN). bJodie Echevaria MV 06/2011: EF 37% and no ischemia or infarction. c. EF 45-50% by echo 01/2012.  . Cardiomyopathy     non-ischemic - probably related to untreated HTN and ETOH abuse  . HYPERLIPIDEMIA   . INGUINAL HERNIA   . ETOH abuse     a. Reported to have quit 05/2011.  . Valvular heart disease     a. Echo 05/2011: moderate to severe  eccentric MR and mild to moderate AI with prolapsing left coronary cusp. b. Echo 01/2012: mild-mod AI, mild dilitation of aortic root, mild MR.;  b. 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 CKD (chronic kidney disease)     a. 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  peak creatinine 3.46 during admx for aortic dissection 2/16  . Tobacco abuse   . Pneumonia ~ 2013  . Headache(784.0)     "q other day" (08/08/2013)  . Chronic sinusitis   . History of medication noncompliance   . Chronic abdominal pain   . DDD (degenerative disc disease), lumbar   . Aortic dissection     a. admx 04/2014 >> L renal infarct; a/c renal failure >> [facility] to re-eval 05/2014 to decide +/- surgery    Past Surgical History  Procedure Laterality Date  . Ankle surgery  Fractures bilaterally  . Inguinal hernia repair Right ~ 1996  . Foot fracture surgery Bilateral 2004-2010    "got pins in both of them"     Current Outpatient Prescriptions  Medication Sig Dispense Refill  . amLODipine (NORVASC) 10 MG tablet Take 1 tablet (10 mg total) by mouth daily. 30 tablet 1  . cloNIDine (CATAPRES) 0.2 MG tablet Take 1 tablet (0.2 mg total) by mouth 3 (three) times daily. 90 tablet 1  . hydrALAZINE (APRESOLINE) 100 MG tablet Take 1 tablet (100 mg total) by mouth 3 (three) times daily. 90 tablet 1  . labetalol (NORMODYNE) 300 MG tablet Take 1 tablet (300 mg total) by mouth 2 (two) times daily. 60 tablet 1  . minoxidil (LONITEN) 2.5 MG tablet Take 1 tablet (2.5 mg total) by mouth 2 (two) times daily. 60 tablet 1  . nitroGLYCERIN (NITRODUR - DOSED IN MG/24 HR) 0.4 mg/hr patch Place 1 patch (0.4 mg total) onto the skin daily. 30 patch 1   No current facility-administered medications for this visit.    Allergies:   Imdur    Social History:  The patient  reports that he quit smoking 12 days ago. His smoking use included Cigarettes. He has a  5.75 pack-year smoking history. He has quit using smokeless tobacco. His smokeless tobacco use included Chew. He reports that he drinks alcohol. He reports that he uses illicit drugs (Marijuana).   Family History:  The patient's family history includes Colon cancer in his paternal uncle; Diabetes in his maternal aunt; Heart attack in his brother; Hypertension in an other family member; Stroke in his maternal aunt.    ROS:   Please see the history of present illness.   Review of Systems  Constitution: Positive for weakness and weight loss.  HENT: Positive for headaches and hearing loss.   Eyes: Positive for visual disturbance.  Musculoskeletal: Positive for myalgias.  Gastrointestinal: Positive for hematochezia.       Patient showed me a picture on his phone - large amount of hematochezia filled bowl  All other systems reviewed and are negative.    PHYSICAL EXAM: VS:  BP 110/70 mmHg  Pulse 75  Ht 6' (1.829 m)  Wt 154 lb (69.854 kg)  BMI 20.88 kg/m2    Orthostatic VS for the past 24 hrs:  BP- Lying Pulse- Lying BP- Sitting Pulse- Sitting BP- Standing at 0 minutes Pulse- Standing at 0 minutes  05/15/14 1543 121/70 mmHg 75 121/74 mmHg 74 120/70 mmHg 78    Wt Readings from Last 3 Encounters:  05/15/14 154 lb (69.854 kg)  05/03/14 161 lb 13.1 oz (73.4 kg)  03/28/14 150 lb 14.4 oz (68.448 kg)     GEN: Well nourished, well developed, in no acute distress HEENT: normal Neck: no JVD, no masses Cardiac:  Normal 99991111, RRR; 1/6 systolic murmur LSB and 2/6 diastolic murmur LSB, no rubs or gallops, no edema  Respiratory:  Decreased BS bilaterally, no wheezing, rhonchi or rales. GI: soft,  Diffuse tenderness, nondistended  MS: no deformity or atrophy Skin: warm and dry  Neuro:  CNs II-XII intact, Strength and sensation are intact Psych: Normal affect   EKG:  EKG is ordered today.  It demonstrates:   NSR, HR 75, LVH, inf-lat TWI, no significant change from last tracing.   Recent  Labs: 09/11/2013: Pro B Natriuretic peptide (BNP) 1981.0* 03/27/2014: B Natriuretic Peptide 550.7* 04/25/2014: TSH 0.724 04/26/2014: ALT 20 05/03/2014: BUN 22; Creatinine 2.27*; Hemoglobin 9.9*; Magnesium 2.1; Platelets 190; Potassium  3.8; Sodium 134*    Lipid Panel    Component Value Date/Time   CHOL 182 03/27/2014 2316   TRIG 49 03/27/2014 2316   HDL 57 03/27/2014 2316   CHOLHDL 3.2 03/27/2014 2316   VLDL 10 03/27/2014 2316   LDLCALC 115* 03/27/2014 2316    ASSESSMENT AND PLAN:  1.  Fatigue:  I suspect he is fatigued because his blood pressure is now normal. He has been used to a blood pressure in the 180-200 range for quite some time. Orthostatic vital signs were unremarkable today. He does admit to hematochezia. Given his recent presentation to the hospital with aortic dissection, I would not adjust his medications at this point. He needs to maintain good blood pressure control.    -  Labs today: Basic metabolic panel, CBC  2.  Aortic Dissection: Follow-up with vascular surgery to determine further management.  3.  HTN: Controlled.  4.  Non-Ischemic CM: Recent echocardiogram with ejection fraction 50%. He is not a candidate for ACE inhibitor given chronic kidney disease. Continue hydralazine, nitrates, beta blocker.  5.  Chronic Combined Systolic and Diastolic CHF: Volume overall appears stable. Continue current therapy.  6.  Chronic Kidney Disease: Follow-up with nephrology as planned. Repeat basic metabolic panel today.  7.  Valvular Heart Disease:  Mild MR and Mod AI by recent echo. Plan follow-up echocardiogram 6-12 months.  8.  Hematochezia:  Question if he had ischemic colitis resulting from the aortic dissection. It did involve the celiac axis. Obtain CBC and basic metabolic panel today. Refer to gastroenterology.   Current medicines are reviewed at length with the patient today.  The patient has concerns regarding medicines.  The following changes have been made:  no  change  Labs/ tests ordered today include:   Orders Placed This Encounter  Procedures  . Basic Metabolic Panel (BMET)  . CBC w/Diff  . Ambulatory referral to Internal Medicine  . Ambulatory referral to Gastroenterology  . EKG 12-Lead    Disposition:   FU with Dr. [name] or me in 1 month.   Signed, Versie Starks, MHS 05/15/2014 3:08 PM    [facility] [address], [place], [place]  [address] Phone: [phone]; Fax: [phone]

## 2014-05-15 NOTE — Patient Instructions (Signed)
LAB WORK TODAY; BMET, CBC W/DIFF  FOLLOW UP WITH [facility], PAC IN 3-4 WEEKS SAME DAY DR. Aundra Dubin IS IN THE OFFICE  You have been referred to GASTROENTEROLOGY ASAP DX HEMATOCHEZIA  You have been referred to [facility]; DX CKD, HTN, HEMATOCHEZIA

## 2014-05-15 NOTE — Telephone Encounter (Signed)
New message      Pt c/o medication issue:  1. Name of Medication: amlodipine 10mg  daily; hydralazine 100mg  tid; minoxidil 25mg  bid; clonidine/HCL 0.2mg  tid; labetalol/HCL 300mg  tid;  2. How are you currently taking this medication (dosage and times per day)?   3. Are you having a reaction (difficulty breathing--STAT)? Hands and feet throbbing---feels numb.  No chest pain, sob, headache. 4. What is your medication issue? Pt was discharged from hosp last week.  He has only had about 2 good days since discharge.  Pt is very weak---staying in bed all week.  Pt thinks his problems are due to the medications.  Please advise

## 2014-05-15 NOTE — Telephone Encounter (Signed)
F/U        Pt calling back states he is also bleeding when he uses restroom.   Please call.

## 2014-05-16 ENCOUNTER — Telehealth: Payer: Self-pay | Admitting: Vascular Surgery

## 2014-05-16 ENCOUNTER — Other Ambulatory Visit: Payer: Self-pay | Admitting: *Deleted

## 2014-05-16 DIAGNOSIS — I7103 Dissection of thoracoabdominal aorta: Secondary | ICD-10-CM

## 2014-05-16 LAB — CBC WITH DIFFERENTIAL/PLATELET
BASOS PCT: 0.7 % (ref 0.0–3.0)
Eosinophils Relative: 4 % (ref 0.0–5.0)
HEMATOCRIT: 32 % — AB (ref 39.0–52.0)
HEMOGLOBIN: 10.7 g/dL — AB (ref 13.0–17.0)
LYMPHS PCT: 33 % (ref 12.0–46.0)
MCHC: 33.6 g/dL (ref 30.0–36.0)
MCV: 87.7 fl (ref 78.0–100.0)
Monocytes Relative: 12 % (ref 3.0–12.0)
NEUTROS PCT: 50 % (ref 43.0–77.0)
PLATELETS: 325 10*3/uL (ref 150.0–400.0)
RBC: 3.64 Mil/uL — ABNORMAL LOW (ref 4.22–5.81)
RDW: 16.2 % — ABNORMAL HIGH (ref 11.5–15.5)
WBC: 4.1 10*3/uL (ref 4.0–10.5)

## 2014-05-16 LAB — BASIC METABOLIC PANEL
BUN: 16 mg/dL (ref 6–23)
CO2: 24 meq/L (ref 19–32)
CREATININE: 1.84 mg/dL — AB (ref 0.40–1.50)
Calcium: 9.5 mg/dL (ref 8.4–10.5)
Chloride: 105 mEq/L (ref 96–112)
GFR: 52.19 mL/min — ABNORMAL LOW (ref >60.00)
GLUCOSE: 92 mg/dL (ref 70–99)
Potassium: 3.8 mEq/L (ref 3.5–5.1)
Sodium: 138 mEq/L (ref 135–145)

## 2014-05-16 NOTE — Telephone Encounter (Signed)
-----   Message from Mena Goes, RN sent at 05/16/2014  9:52 AM EST ----- Regarding: RE: Order I put in the CT without  ----- Message -----    From: Gena Fray    Sent: 05/16/2014   9:14 AM      To: Mena Goes, RN Subject: RE: Order                                      Thanks Zigmund Daniel.  Upon further review, I noticed that he has chronic renal failure and his last labs were elevated. Should we do a non-contrast study?  Hinton Dyer ----- Message -----    From: Mena Goes, RN    Sent: 05/15/2014  12:40 PM      To: Gena Fray Subject: RE: Order                                      He needs CTA of chest, abd/pelvis for thoracoabdominal aortic dissection   ----- Message -----    From: Gena Fray    Sent: 05/15/2014  12:16 PM      To: Mena Goes, RN Subject: Order                                          Rudell Cobb!  Colletta [place] asked me to look at [facility] discharge note from 02/18 and schedule this patient appropriatley. Could you please enter the CTA order?  According to [facility]:   "From vascular standpoint, will repeat CTA in one month.  Continue strict BP control.   F/u with Dr. Oneida Alar in 4 weeks. Will sign off. Call if needed.   Leontine Locket, PA-C 05/03/2014"  Thanks! Hinton Dyer

## 2014-05-16 NOTE — Telephone Encounter (Signed)
Spoke with patient to schedule. He is going to [facility] for CT and then seeing [facility] the following week.

## 2014-05-17 ENCOUNTER — Telehealth: Payer: Self-pay | Admitting: *Deleted

## 2014-05-17 NOTE — Telephone Encounter (Signed)
pt notified about lab results with verbal understanding  

## 2014-05-22 ENCOUNTER — Ambulatory Visit (INDEPENDENT_AMBULATORY_CARE_PROVIDER_SITE_OTHER): Payer: Medicare Other | Admitting: Physician Assistant

## 2014-05-22 ENCOUNTER — Encounter: Payer: Self-pay | Admitting: Physician Assistant

## 2014-05-22 ENCOUNTER — Other Ambulatory Visit (INDEPENDENT_AMBULATORY_CARE_PROVIDER_SITE_OTHER): Payer: Medicare Other

## 2014-05-22 VITALS — BP 120/80 | HR 84 | Ht 72.0 in | Wt 153.1 lb

## 2014-05-22 DIAGNOSIS — K559 Vascular disorder of intestine, unspecified: Secondary | ICD-10-CM

## 2014-05-22 DIAGNOSIS — K625 Hemorrhage of anus and rectum: Secondary | ICD-10-CM | POA: Diagnosis not present

## 2014-05-22 DIAGNOSIS — K649 Unspecified hemorrhoids: Secondary | ICD-10-CM | POA: Diagnosis not present

## 2014-05-22 LAB — CBC WITH DIFFERENTIAL/PLATELET
BASOS ABS: 0 10*3/uL (ref 0.0–0.1)
Basophils Relative: 0.7 % (ref 0.0–3.0)
EOS ABS: 0.1 10*3/uL (ref 0.0–0.7)
Eosinophils Relative: 2.3 % (ref 0.0–5.0)
HCT: 30.5 % — ABNORMAL LOW (ref 39.0–52.0)
Hemoglobin: 10.3 g/dL — ABNORMAL LOW (ref 13.0–17.0)
LYMPHS ABS: 1.3 10*3/uL (ref 0.7–4.0)
Lymphocytes Relative: 29.1 % (ref 12.0–46.0)
MCHC: 33.8 g/dL (ref 30.0–36.0)
MCV: 87.5 fl (ref 78.0–100.0)
Monocytes Absolute: 0.5 10*3/uL (ref 0.1–1.0)
Monocytes Relative: 11.8 % (ref 3.0–12.0)
NEUTROS ABS: 2.5 10*3/uL (ref 1.4–7.7)
Neutrophils Relative %: 56.1 % (ref 43.0–77.0)
Platelets: 213 10*3/uL (ref 150.0–400.0)
RBC: 3.49 Mil/uL — ABNORMAL LOW (ref 4.22–5.81)
RDW: 16.8 % — ABNORMAL HIGH (ref 11.5–15.5)
WBC: 4.4 10*3/uL (ref 4.0–10.5)

## 2014-05-22 LAB — PROTIME-INR
INR: 1.1 ratio — AB (ref 0.8–1.0)
Prothrombin Time: 12.3 s (ref 9.6–13.1)

## 2014-05-22 MED ORDER — HYDROCORTISONE ACETATE 25 MG RE SUPP: RECTAL | Status: DC

## 2014-05-22 NOTE — Patient Instructions (Signed)
Please go to the basement level to have your labs drawn.   We sent a prescription to [facility].   1. Anusol HC Suppositories.   We will call you with an appointment the end of May with Amy Esterwood PA-C.

## 2014-05-22 NOTE — Progress Notes (Signed)
Patient ID: [name], male   DOB: 06/19/1970, 44 y.o.   MRN: [id number]   Subjective:    Patient ID: [name], male    DOB: December 30, 1970, 44 y.o.   MRN: [id number]  HPI [name] is a pleasant 44 year old African-American male new to GI referred by Dr. Joni Ewing for evaluation of rectal bleeding. Patient has not had any prior GI evaluation. Unfortunately he currently has multiple other serious medical problems and was just hospitalized in February 2016 with a type be aortic dissection secondary to hypertensive emergency. He dissected from the margin of the left subclavian into the abdominal aorta below the level of the renal arteries. He had left renal artery involvement and infarction with acute on chronic renal failure. During that admission he was followed by critical care, vascular surgery, and nephrology. Vascular surgery wanted him to have improved blood pressure control and improvement in his renal function then repeat CT scan in 1 month prior to proceeding with surgical repair of the aortic dissection. He has history of severe hypertension on ischemic cardiomyopathy EF of 35-40%, chronic kidney disease, EtOH abuse, and history of cluster headaches. Patient states that he has noticed some rectal bleeding off and on for the past month or so over the past week he is having a bowel movement every 2-3 days and passing dark stool with a reddish tint. He says the blood is mixed in with his bowel movement and he has seen some clots. He is not having any abdominal pain and does not recall having any abdominal pain over the past month. No cramping. Appetite is been fine and weight has been stable no nausea or vomiting. He is not taking any regular aspirin or NSAIDs. He denies any rectal pain but has had some rectal pressure at times. Patient tells me his family history is negative for colon cancer. He states he has quit drinking and is trying to be healthy. Last CBC on 05/15/2014 hemoglobin 10.7  hematocrit of 30-2 weeks prior to that hemoglobin was 9.5 hematocrit of 27 Patient is scheduled for follow-up CT tomorrow and has appointment with [facility] the following week.  Review of Systems Pertinent positive and negative review of systems were noted in the above HPI section.  All other review of systems was otherwise negative.  Outpatient Encounter Prescriptions as of 05/22/2014  Medication Sig  . amLODipine (NORVASC) 10 MG tablet Take 1 tablet (10 mg total) by mouth daily.  . cloNIDine (CATAPRES) 0.2 MG tablet Take 1 tablet (0.2 mg total) by mouth 3 (three) times daily.  . hydrALAZINE (APRESOLINE) 100 MG tablet Take 1 tablet (100 mg total) by mouth 3 (three) times daily.  [name] labetalol (NORMODYNE) 300 MG tablet Take 1 tablet (300 mg total) by mouth 2 (two) times daily.  . minoxidil (LONITEN) 2.5 MG tablet Take 1 tablet (2.5 mg total) by mouth 2 (two) times daily.  . nitroGLYCERIN (NITRODUR - DOSED IN MG/24 HR) 0.4 mg/hr patch Place 1 patch (0.4 mg total) onto the skin daily.  . hydrocortisone (ANUSOL-HC) 25 MG suppository Use 1 suppository rectally at bedtime.   Allergies  Allergen Reactions  . Imdur [Isosorbide] Other (See Comments)    headache   Patient Active Problem List   Diagnosis Date Noted  . CKD (chronic kidney disease) 05/15/2014  . Hypoxia   . AKI (acute kidney injury)   . Aortic dissection, thoracoabdominal   . Dissecting aneurysm of thoracic aorta, Stanford type B 04/24/2014  . Aortic dissection, thoracic 04/24/2014  .  Aortic dissection 04/23/2014  . Aneurysm of ascending aorta 03/28/2014  . Dyspnea 03/27/2014  . Prolonged Q-T interval on ECG 03/27/2014  . ETOH abuse 09/20/2013  . Hypertensive crisis 08/08/2013  . Sinusitis, bacterial 08/08/2013  . Malignant hypertension 02/20/2013  . Hypertensive urgency 01/27/2012  . Cardiomyopathy, hypertensive 01/26/2012  . AI (aortic insufficiency) 01/26/2012  . MR (mitral regurgitation) 01/26/2012  . Acute renal failure  superimposed on stage 3 chronic kidney disease 01/26/2012  . Systolic CHF, chronic [id number]  . Chronic bronchitis 05/23/2011  . Hypertensive emergency 05/22/2011  . Cardiomegaly - hypertensive 05/22/2011  . Tobacco abuse 05/22/2011  . Marijuana use 05/22/2011  . Abdominal pain 05/22/2011  . Hypertension, malignant 05/22/2011  . Anxiety and depression 05/22/2011  . Hyperlipidemia 08/26/2009  . Tobacco use 08/26/2009  . Headache 08/26/2009  . Aortic valve disorder 03/11/2009  . INGUINAL HERNIA 02/18/2009  . Uncontrolled hypertension 01/16/2009  . MURMUR 01/16/2009   History   Social History  . Marital Status: Married    Spouse Name: N/A  . Number of Children: 5  . Years of Education: N/A   Occupational History  . Disabled, part-time Programmer, systems     Disability   Social History Main Topics  . Smoking status: Current Every Day Smoker -- 0.25 packs/day for 23 years    Types: Cigarettes    Last Attempt to Quit: 05/03/2014  . Smokeless tobacco: Former Systems developer    Types: Chew     Comment: as of 05-22-14 down to 3-4 cigs daily  . Alcohol Use: No     Comment: + previous use  . Drug Use: Yes    Special: Marijuana     Comment: 08/08/2013 "twice a month"  . Sexual Activity: Yes   Other Topics Concern  . Not on file   Social History Narrative    [name] family history includes Colon cancer in his paternal uncle; Diabetes in his maternal aunt; Heart attack in his brother; Hypertension in an other family member; Stroke in his maternal aunt. There is no history of Throat cancer, Pancreatic cancer, Esophageal cancer, Kidney disease, or Liver disease.      Objective:    Filed Vitals:   05/22/14 0835  BP: 120/80  Pulse: 84    Physical Exam  well-developed African-American male in no acute distress, pleasant blood pressure 120/80 pulse 84 height 6 foot weight 153. HEENT; nontraumatic normocephalic EOMI PERRLA sclera anicteric, Supple; no JVD, Cardiovascular; regular rate  and rhythm with [id number] soft systolic murmur, Pulmonary ;clear bilaterally, Abdomen ;soft nondistended nontender bowel sounds are active there is no palpable mass or hepatosplenomegaly, Rectal ;exam he has a large hemorrhoid which appears to be prolapsed internal hemorrhoid which is swollen and erythematous and tender not thrombosed stool is brown and Hemoccult positive, Ext; no clubbing cyanosis or edema skin warm and dry, Psych: mood and affect normal and appropriate       Assessment & Plan:   #1 44 yo male with rectal bleeding with blood mixed with stool x several weeks- no associated abdominal pain. This is in setting of hospitalization 2/8 /16  with Type B aortic dissection , and renal infarct  Secondary to hypertensive emergency. Plan is for pt to have follow up CT tomorrow and surgical  repair of dissected aneurysm . Rectal bleeding may be secondary to ischemic colitis. Cannot r/o occult colon lesion. He also has a large hemorrhoid though doubt this is source for bloody stools. #2 acute on chronic renal failure #3 Cardiomyopathy #  4 CHF #5 aortic insufficiency  Plan; Pt is not an appropriate  Colonoscopy candidate at this time with dissected aneurysm -awaiting repair. He should have a colonoscopy after  He recovers form aneurysm repair. Will repeat hgb today to assure stability, check PT Start Anusol HC supp at bedtime for hemorrhoid Have asked him to follow up with [name] in 3 months He is also advised to seek ER care if he develops abdominal pain or increased bleeding .  Joseline Mccampbell S Opal Mckellips PA-C 05/22/2014   Cc: Andre Fears, MD

## 2014-05-22 NOTE — Progress Notes (Signed)
Reviewed and agree with management plan.  Himani Corona T. Dallon Dacosta, MD FACG 

## 2014-05-23 ENCOUNTER — Ambulatory Visit (HOSPITAL_COMMUNITY): Admission: RE | Admit: 2014-05-23 | Payer: Medicare Other | Source: Ambulatory Visit

## 2014-05-24 ENCOUNTER — Ambulatory Visit (HOSPITAL_COMMUNITY): Admission: RE | Admit: 2014-05-24 | Payer: Medicare Other | Source: Ambulatory Visit

## 2014-05-29 DIAGNOSIS — N183 Chronic kidney disease, stage 3 (moderate): Secondary | ICD-10-CM | POA: Diagnosis not present

## 2014-05-29 DIAGNOSIS — I7100 Dissection of unspecified site of aorta: Secondary | ICD-10-CM | POA: Diagnosis not present

## 2014-05-29 DIAGNOSIS — N280 Ischemia and infarction of kidney: Secondary | ICD-10-CM | POA: Diagnosis not present

## 2014-05-29 DIAGNOSIS — I10 Essential (primary) hypertension: Secondary | ICD-10-CM | POA: Diagnosis not present

## 2014-05-30 ENCOUNTER — Encounter: Payer: Self-pay | Admitting: Vascular Surgery

## 2014-05-31 ENCOUNTER — Telehealth: Payer: Self-pay | Admitting: Cardiology

## 2014-05-31 ENCOUNTER — Encounter: Payer: Self-pay | Admitting: Vascular Surgery

## 2014-05-31 ENCOUNTER — Ambulatory Visit (INDEPENDENT_AMBULATORY_CARE_PROVIDER_SITE_OTHER): Payer: Medicare Other | Admitting: Vascular Surgery

## 2014-05-31 VITALS — BP 132/80 | HR 84 | Ht 72.0 in | Wt 151.4 lb

## 2014-05-31 DIAGNOSIS — I7103 Dissection of thoracoabdominal aorta: Secondary | ICD-10-CM | POA: Diagnosis not present

## 2014-05-31 NOTE — Telephone Encounter (Signed)
New Message   Cathy from [facility] calling about pt's Preop disection repair, possible CABG. Please call back and discuss.

## 2014-05-31 NOTE — Progress Notes (Signed)
[facility] HISTORY AND PHYSICAL   History of Present Illness:  Patient is a 44 y.o. year old male who presents for evaluation of recent type B aortic dissection. This extended all the way to the level of the left subclavian artery and it was not amenable to stent graft repair. 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  He presented with chest pain which has now essentially resolved with occasional mild problems relieved usually with Tylenol. He states he has been compliant with his blood pressure medications. I emphasized to him that it will be very important for him to continue to take his blood pressure at home.  Other medical problems include anxiety, hyperlipidemia, CK D with most recent creatinine 1.8 approximate 2 weeks ago.  Past Medical History  Diagnosis Date  . Hypertension     a. Hx of HTN urgency secondary to noncompliance. b. urinary metanephrine and catecholeamine levels normal 2013.  [name] Anxiety   . GERD (gastroesophageal reflux disease)   . Acute renal failure     June 2012 felt secondary to Toradol  . Chronic renal failure   . Chronic combined systolic and diastolic congestive heart failure     a. 05/2011: Adm with pulm edema/HTN urgency, EF 35-40% with diffuse hypokinesis and moderate to severe mitral regurgitation. Cardiomyopathy likely due to uncontrolled HTN and ETOH abuse - cath deferred due to renal insufficiency (felt due to uncontrolled HTN). bJodie Echevaria MV 06/2011: EF 37% and no ischemia or infarction. c. EF 45-50% by echo 01/2012.  . Cardiomyopathy     non-ischemic - probably related to untreated HTN and ETOH abuse  . HYPERLIPIDEMIA   . INGUINAL HERNIA   . ETOH abuse     a. Reported to have quit 05/2011.  . Valvular heart disease     a. Echo 05/2011: moderate to severe eccentric MR and mild to moderate AI with prolapsing left coronary cusp. b. Echo 01/2012: mild-mod AI, mild dilitation of aortic root, mild MR.;  b. Echo 1/16: Severe  LVH consistent with hypertrophic cardio myopathy, EF 50%, no RWMA, mod AI, mild MR, mild RAE, dilated Ao root (40 mm)   . CKD (chronic kidney disease)     a. 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  peak creatinine 3.46 during admx for aortic dissection 2/16  . Tobacco abuse   . Pneumonia ~ 2013  . Headache(784.0)     "q other day" (08/08/2013)  . Chronic sinusitis   . History of medication noncompliance   . Chronic abdominal pain   . DDD (degenerative disc disease), lumbar   . Aortic dissection     a. admx 04/2014 >> L renal infarct; a/c renal failure >> VVS to re-eval 05/2014 to decide +/- surgery    Past Surgical History  Procedure Laterality Date  . Ankle surgery      Fractures bilaterally  . Inguinal hernia repair Right ~ 1996  . Foot fracture surgery Bilateral 2004-2010    "got pins in both of them"    Social History History  Substance Use Topics  . Smoking status: Current Every Day Smoker -- 0.25 packs/day for 23 years    Types: Cigarettes    Last Attempt to Quit: 05/03/2014  . Smokeless tobacco: Former Systems developer    Types: Chew     Comment: as of 05-22-14 down to 3-4 cigs daily  . Alcohol Use: No     Comment: + previous use    Family History Family History  Problem Relation  Age of Onset  . Hypertension    . Colon cancer Paternal Uncle   . Stroke Maternal Aunt   . Heart attack Brother   . Diabetes Maternal Aunt   . Throat cancer Neg Hx   . Pancreatic cancer Neg Hx   . Esophageal cancer Neg Hx   . Kidney disease Neg Hx   . Liver disease Neg Hx     Allergies  Allergies  Allergen Reactions  . Imdur [Isosorbide] Other (See Comments)    headache     Current Outpatient Prescriptions  Medication Sig Dispense Refill  . amLODipine (NORVASC) 10 MG tablet Take 1 tablet (10 mg total) by mouth daily. 30 tablet 1  . cloNIDine (CATAPRES) 0.2 MG tablet Take 1 tablet (0.2 mg total) by mouth 3 (three) times daily. 90 tablet 1  . hydrALAZINE (APRESOLINE) 100 MG tablet Take 1 tablet  (100 mg total) by mouth 3 (three) times daily. 90 tablet 1  . hydrocortisone (ANUSOL-HC) 25 MG suppository Use 1 suppository rectally at bedtime. 12 suppository 6  . labetalol (NORMODYNE) 300 MG tablet Take 1 tablet (300 mg total) by mouth 2 (two) times daily. 60 tablet 1  . minoxidil (LONITEN) 2.5 MG tablet Take 1 tablet (2.5 mg total) by mouth 2 (two) times daily. 60 tablet 1  . nitroGLYCERIN (NITRODUR - DOSED IN MG/24 HR) 0.4 mg/hr patch Place 1 patch (0.4 mg total) onto the skin daily. 30 patch 1   No current facility-administered medications for this visit.    ROS:   General:  No weight loss, Fever, chills  HEENT: No recent headaches, no nasal bleeding, no visual changes, no sore throat  Neurologic: No dizziness, blackouts, seizures. No recent symptoms of stroke or mini- stroke. No recent episodes of slurred speech, or temporary blindness.  Cardiac: No recent episodes of chest pain/pressure, no shortness of breath at rest.  No shortness of breath with exertion.  Denies history of atrial fibrillation or irregular heartbeat  Vascular: No history of rest pain in feet.  No history of claudication.  No history of non-healing ulcer, No history of DVT   Pulmonary: No home oxygen, no productive cough, no hemoptysis,  No asthma or wheezing  Musculoskeletal:  [ ]  Arthritis, [ ]  Low back pain,  [ ]  Joint pain  Hematologic:No history of hypercoagulable state.  No history of easy bleeding.  No history of anemia  Gastrointestinal: No hematochezia or melena,  No gastroesophageal reflux, no trouble swallowing  Urinary: [ x] chronic Kidney disease, [ ]  on HD - [ ]  MWF or [ ]  TTHS, [ ]  Burning with urination, [ ]  Frequent urination, [ ]  Difficulty urinating;   Skin: No rashes  Psychological: + history of anxiety,  No history of depression   Physical Examination  Filed Vitals:   05/31/14 1039  BP: 132/80  Pulse: 84  Height: 6' (1.829 m)  Weight: 151 lb 6.4 oz (68.675 kg)  SpO2: 98%     Body mass index is 20.53 kg/(m^2).  General:  Alert and oriented, no acute distress HEENT: Normal Neck: No bruit or JVD Pulmonary: Clear to auscultation bilaterally Cardiac: Regular Rate and Rhythm 2/6 high-pitched murmur left second interspace Abdomen: Soft, non-tender, non-distended, no mass Skin: No rash Extremity Pulses:  2+ radial, brachial, 2+ femoral, dorsalis pedis pulses bilaterally Musculoskeletal: No deformity or edema  Neurologic: Upper and lower extremity motor 5/5 and symmetric    ASSESSMENT:  Type B aortic dissection currently stable. The patient did not receive a  CT scan due to his elevated creatinine and I did not feel we would get good information with a noncontrast CT alone. The patient has decided he would like to have the dissection repaired. I discussed with him today that this would require arch replacement as well as then possible stent grafting of the descending thoracic portion. The patient understands that this would be a significant procedure with high morbidity and possible mortality. I discussed with him the possibility of stroke paraplegia blood transfusion wound infection possible death with the operation. In light of this we are going to need better imaging with contrast. The patient is not a candidate for MRI exam due to orthopedic hardware in both feet   PLAN:  #1 the patient will be scheduled for evaluation by cardiology since his arch replacement will most likely require sternotomy to make sure that there is no coronary artery disease needs to be addressed simultaneously. #2 the patient will be scheduled to see Dr. Servando Snare in the next 2-4 weeks to discuss repair. The patient will follow-up with me in 2 weeks with a repeat creatinine at that time. We will then make a decision at that time about reimaging him with contrast. The patient was again advised that he has severe chest pain that has not improved with Tylenol alone he should report to the emergency  room.  Ruta Hinds, MD [facility] Office: [phone] Pager: [phone]

## 2014-05-31 NOTE — Telephone Encounter (Signed)
LMTCB with Sharyn Lull for [place].

## 2014-06-01 ENCOUNTER — Telehealth: Payer: Self-pay | Admitting: Cardiology

## 2014-06-01 ENCOUNTER — Telehealth: Payer: Self-pay | Admitting: Vascular Surgery

## 2014-06-01 NOTE — Telephone Encounter (Signed)
Spoke with Modena Nunnery is going to send surgery request to Dr Aundra Dubin for recommendations.

## 2014-06-01 NOTE — Telephone Encounter (Signed)
Have him keep followup with Nicki Reaper as scheduled, I will need to discuss with [facility] after appt. I will contact Dr Oneida Alar about coronary evaluation.

## 2014-06-01 NOTE — Telephone Encounter (Signed)
This note is copied from Dr Oneida Alar 05/31/14 office note   #1 the patient will be scheduled for evaluation by cardiology since his arch replacement will most likely require sternotomy to make sure that there is no coronary artery disease needs to be addressed simultaneously. #2 the patient will be scheduled to see Dr. Servando Snare in the next 2-4 weeks to discuss repair. The patient will follow-up with me in 2 weeks with a repeat creatinine at that time. We will then make a decision at that time about reimaging him with contrast. The patient was again advised that he has severe chest pain that has not improved with Tylenol alone he should report to the emergency room

## 2014-06-01 NOTE — Telephone Encounter (Signed)
FYI --Dr Melida Gimenez above note from Dr Oneida Alar 05/31/14: Pt saw Scott 05/15/14 and has an appt to see him in follow-up 06/08/14.   Are there any further recommendations at this time?

## 2014-06-01 NOTE — Telephone Encounter (Signed)
LMTCB

## 2014-06-01 NOTE — Telephone Encounter (Signed)
Spoke with pt - gave info: 3/31 appt with Dr. Servando Snare, labs at [facility] - will do on 3/31 when seeing dr. Servando Snare), appt with Dr. Kathlen Mody on 3/25 (sent fax to Dr. Aundra Dubin re: do we still need an appt with you since pt is seeing Dr. Kathlen Mody on 3/25) Pt to see Dr. Oneida Alar on 4/7. Pt verbalized understanding. Michela Pitcher he was not aware of appt with Dr. Kathlen Mody on 3/25 and will call to get address & info.

## 2014-06-01 NOTE — Telephone Encounter (Signed)
BUSY 

## 2014-06-01 NOTE — Telephone Encounter (Signed)
Do I need to arrange cardiac cath when I see him? Richardson Dopp, PA-C   06/01/2014 2:21 PM

## 2014-06-01 NOTE — Telephone Encounter (Signed)
New Message    Juliann Pulse from [facility] wants to make sure that the orders that are put in per Dr Oneida Alar notes be done. If you have an questions regarding this please give the a call back.

## 2014-06-01 NOTE — Telephone Encounter (Signed)
I sent a message to Ruta Hinds to ask about that. He's also going to have to have CTA chest apparently, so would like to avoid the extra contrast unless absolutely needs cath.  If has sternotomy, will need cath.

## 2014-06-07 NOTE — Progress Notes (Addendum)
Cardiology Office Note   Date:  06/08/2014   ID:  [name], DOB 07-Mar-1971, MRN [id number]  PCP:  [name], MD  Cardiologist:  Dr. [name]    Chief Complaint  Patient presents with  . Hypertensive Heart Disease     History of Present Illness: [name] is a 44 y.o. male with a hx of HTN, NICM likely related to HTN heart disease, tobacco and ETOH abuse, CKD, valvular heart disease.  Admitted in 05/2011 with hypertensive emergency c/b pulmonary edema.  He was diuresed.  Echo demonstrated EF 35-40% with diff HK and mod to severe MR.  DCM was felt to be due to HTN and ETOH abuse.  LHC was not done due to CKD.  Myoview was negative for ischemia or scar.  Admitted again in 6/15 with HTN emergency (ran out of medications).  Last echo in 02/2013 demonstrated EF 45-50% and mod AI and mild MR.  Last seen by Dr. [name] 11/2013.  He was out of his medications.  These were to be resumed and he was supposed to FU in 1 month.  Admitted 03/2014 with HTN emergency in the setting of non-adherence to medications.  Chest CTA demonstrated stable 4.2 cm ascending aortic aneurysm.   Patient left AMA when he did not receive Morphine for HAs.    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  Dissection arose just distal to the margin of the left subclavian and extended into the abdominal aorta below the level of the renal arteries. There was involvement of the left renal artery with infarction and resultant acute on chronic renal failure (peak creatinine 3.46) . He was followed by critical care, vascular surgery and nephrology.  Vascular surgery recommended improved blood pressure control and repeat CT in 1 month prior to proceeding with surgical repair.  Creatinine improved to 2.27 prior to discharge.  He was discharged home on a regimen of amlodipine 10 mg, clonidine 0.2 mg 3 times a day, hydralazine 100 mg 3 times a day, labetalol 300 mg twice a day,  minoxidil 2.5 mg twice a day, nitroglycerin patch 0.4 mg daily. He has follow-up with nephrology and vascular surgery pending.    I saw him earlier this month and he complained of fatigue and hematochezia. I referred him to gastroenterology. Hemoglobin has remained stable. He will require colonoscopy once his aortic dissection is fixed.    He has seen Dr. Oneida Alar for vascular surgery and follow-up. Plan is to proceed with aortic dissection repair. The procedure would require arch replacement. Dr. Oneida Alar has reviewed this with Dr. Aundra Dubin. Since his arch replacement will most likely require sternotomy, the patient will need to have CAD ruled out. The patient will see Dr. Servando Snare in the next 2-4 weeks to discuss repair.  Chest CTA is pending. We will need to arrange cardiac catheterization it the patient does need sternotomy.  He returns for FU.  He is here with his wife today.  He is overall doing well. He has noted occasional abdominal and lower chest pain since he was admitted with the dissection.  This is more positional than anything else.  He denies exertional symptoms.  He denies DOE.  He denies syncope.  He denies orthopnea, PND, edema.  He is taking all of his medications.     Studies/Reports Reviewed Today:  2-D echocardiogram 03/2014 Severe LVH consistent with hypertrophic cardiomyopathy, EF 50%, normal wall motion Moderate AI Slight dilatation of aortic root (40 mm)  Mild MR Mild RAE  Renal Artery [name] 03/28/14 No evidence of renal artery stenosis noted bilaterally.  Myoview 06/2011 No evidence of ischemia or infarction. Suspect nonischemic cardiomyopathy.  LV Ejection Fraction: 37%  Dg Chest Port 1 View  05/01/2014    IMPRESSION: Slight interval improvement in the left lower lobe atelectasis or pneumonia. Small pleural effusions are less conspicuous. Mild mediastinal widening is unchanged.   Electronically Signed   By: David  Martinique   On: 05/01/2014 07:35   Ct Angio Chest Aorta W/cm  &/or Wo/cm  04/23/2014    IMPRESSION: Type B aortic dissection arising just distal to the origin of the left subclavian artery and extending into the abdominal aorta below the level of the renal arteries. Devascularization of the left kidney. Left renal artery origin and celiac axis origin appear from the false lumen. Superior mesenteric artery and right renal artery origins appear from the true lumen.  These results were called by telephone at the time of interpretation on 04/23/2014 at 10:43 pm to Dr. Quintella Reichert , who verbally acknowledged these results.   Electronically Signed   By: Lucienne Capers M.D.   On: 04/23/2014 22:45     Past Medical History: 1. H/o ankle fractures bilaterally 2. GERD 3. HTN: Negative urinary catecholeamine collection for pheochromocytoma.  4. CKD: Suspect hypertensive nephropathy.  5. Cardiomyopathy: Most likely due to heavy ETOH ingestion and uncontrolled HTN. 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 Lexiscan myoview (4/13): EF 37%, global hypokinesis, no ischemic or infarction. Echo (12/14) with EF 45-50%, diffuse hypokinesis, moderate LVH, mild MR, moderate AI.  6. ETOH abuse: Quit 3/13.  7. Active smoker. 8. Valvular heart disease: Echo in 3/13 showed moderate to severe eccentric MR and mild to moderate AI with prolapsing left coronary cusp. Echo 12/14 with mild MR and moderate AI.  9. Headache with Imdur.  Past Medical History  Diagnosis Date  . Hypertension     a. Hx of HTN urgency secondary to noncompliance. b. urinary metanephrine and catecholeamine levels normal 2013.  [name] Anxiety   . GERD (gastroesophageal reflux disease)   . Acute renal failure     June 2012 felt secondary to Toradol  . Chronic renal failure   . Chronic combined systolic and diastolic congestive heart failure     a. 05/2011: Adm with pulm edema/HTN  urgency, EF 35-40% with diffuse hypokinesis and moderate to severe mitral regurgitation. Cardiomyopathy likely due to uncontrolled HTN and ETOH abuse - cath deferred due to renal insufficiency (felt due to uncontrolled HTN). bJodie Echevaria MV 06/2011: EF 37% and no ischemia or infarction. c. EF 45-50% by echo 01/2012.  . Cardiomyopathy     non-ischemic - probably related to untreated HTN and ETOH abuse  . HYPERLIPIDEMIA   . INGUINAL HERNIA   . ETOH abuse     a. Reported to have quit 05/2011.  . Valvular heart disease     a. Echo 05/2011: moderate to severe eccentric MR and mild to moderate AI with prolapsing left coronary cusp. b. Echo 01/2012: mild-mod AI, mild dilitation of aortic root, mild MR.;  b. 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 CKD (chronic kidney disease)     a. 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  peak creatinine 3.46 during  admx for aortic dissection 2/16  . Tobacco abuse   . Pneumonia ~ 2013  . Headache(784.0)     "q other day" (08/08/2013)  . Chronic sinusitis   . History of medication noncompliance   . Chronic abdominal pain   . DDD (degenerative disc disease), lumbar   . Aortic dissection     a. admx 04/2014 >> L renal infarct; a/c renal failure >> VVS to re-eval 05/2014 to decide +/- surgery    Past Surgical History  Procedure Laterality Date  . Ankle surgery      Fractures bilaterally  . Inguinal hernia repair Right ~ 1996  . Foot fracture surgery Bilateral 2004-2010    "got pins in both of them"     Current Outpatient Prescriptions  Medication Sig Dispense Refill  . amLODipine (NORVASC) 10 MG tablet Take 1 tablet (10 mg total) by mouth daily. 30 tablet 1  . cloNIDine (CATAPRES) 0.2 MG tablet Take 1 tablet (0.2 mg total) by mouth 3 (three) times daily. 90 tablet 1  . hydrALAZINE (APRESOLINE) 100 MG tablet Take 1 tablet (100 mg total) by mouth 3 (three) times daily. 90  tablet 1  . hydrocortisone (ANUSOL-HC) 25 MG suppository Use 1 suppository rectally at bedtime. 12 suppository 6  . labetalol (NORMODYNE) 300 MG tablet Take 1 tablet (300 mg total) by mouth 2 (two) times daily. 60 tablet 1  . minoxidil (LONITEN) 2.5 MG tablet Take 1 tablet (2.5 mg total) by mouth 2 (two) times daily. 60 tablet 1  . nitroGLYCERIN (NITRODUR - DOSED IN MG/24 HR) 0.4 mg/hr patch Place 1 patch (0.4 mg total) onto the skin daily. 30 patch 1   No current facility-administered medications for this visit.    Allergies:   Imdur    Social History:  The patient  reports that he has been smoking Cigarettes.  He has a 5.75 pack-year smoking history. He has quit using smokeless tobacco. His smokeless tobacco use included Chew. He reports that he uses illicit drugs (Marijuana). He reports that he does not drink alcohol.   Family History:  The patient's family history includes Colon cancer in his paternal uncle; Diabetes in his maternal aunt; Heart attack in his brother; Hypertension in an other family member; Stroke in his maternal aunt. There is no history of Throat cancer, Pancreatic cancer, Esophageal cancer, Kidney disease, or Liver disease.    ROS:   Please see the history of present illness.   Review of Systems  HENT: Positive for headaches.   Cardiovascular: Positive for chest pain.  Hematologic/Lymphatic: Positive for bleeding problem.  Musculoskeletal: Positive for back pain.  Gastrointestinal: Positive for abdominal pain.       Still has BRBPR "every 3 days" - does have a hard BM; noted to have hemorrhoid at GI - seems to be getting lighter.  Neurological: Positive for dizziness.  All other systems reviewed and are negative.    PHYSICAL EXAM: VS:  BP 137/78 mmHg  Pulse 88  Ht 6' (1.829 m)  Wt 152 lb (68.947 kg)  BMI 20.61 kg/m2     Wt Readings from Last 3 Encounters:  06/08/14 152 lb (68.947 kg)  05/31/14 151 lb 6.4 oz (68.675 kg)  05/22/14 153 lb 1.6 oz (69.446  kg)     GEN: Well nourished, well developed, in no acute distress HEENT: normal Neck: no JVD, no masses Cardiac:  Normal S1/S2, RRR; no murmur, no rubs or gallops, no edema  Respiratory:  Decreased BS bilaterally, no wheezing, rhonchi  or rales. GI: soft,  Diffuse tenderness, nondistended  MS: no deformity or atrophy Skin: warm and dry  Neuro:  CNs II-XII intact, Strength and sensation are intact Psych: Normal affect   EKG:  EKG is ordered today.  It demonstrates:   NSR, HR 88, normal axis, LVH with repol abnormality, QTc 493, no change from prior tracing.    Recent Labs: 09/11/2013: Pro B Natriuretic peptide (BNP) 1981.0* 03/27/2014: B Natriuretic Peptide 550.7* 04/25/2014: TSH 0.724 04/26/2014: ALT 20 05/03/2014: Magnesium 2.1 05/15/2014: BUN 16; Creatinine 1.84*; Potassium 3.8; Sodium 138 05/22/2014: Hemoglobin 10.3*; Platelets 213.0    Lipid Panel    Component Value Date/Time   CHOL 182 03/27/2014 2316   TRIG 49 03/27/2014 2316   HDL 57 03/27/2014 2316   CHOLHDL 3.2 03/27/2014 2316   VLDL 10 03/27/2014 2316   LDLCALC 115* 03/27/2014 2316    ASSESSMENT AND PLAN:  1.  Aortic Dissection:  He has already had FU with Dr. Oneida Alar and sees Dr. Servando Snare next week.  If he needs a median sternotomy, we will need to proceed with LHC to assess for CAD.  I will cc my note to Dr. Oneida Alar and Dr. Servando Snare.  I have reviewed his case today with Dr. [name] by phone.  If he indeed needs a median sternotomy, I will arrange LHC week after next with Dr. [name].  Risks and benefits of cardiac catheterization have been discussed with the patient.  These include bleeding, infection, kidney damage, stroke, heart attack, death.  The patient understands these risks and is willing to proceed, if cath is needed.  We will obtain BMET, INR, CBC today.  2.  HTN:  Controlled. I would like to see his HR lower with his hx of dissection.      -  Increase Labetalol to 400 mg Twice daily  3.  Non-Ischemic  CM: Recent echocardiogram with ejection fraction 50%. He is not a candidate for ACE inhibitor given chronic kidney disease, at this time.  We can certainly consider ACE inhibitor or angiotensin receptor blocker if his renal fxn improves or remains stable.  Continue hydralazine, nitrates, beta blocker.   4.  Chronic Combined Systolic and Diastolic CHF:  Volume overall appears stable. Continue current therapy.  5.  Chronic Kidney Disease: Follow-up with nephrology as planned.   6.  Valvular Heart Disease:  Mild MR and Mod AI by recent echo. Plan to order follow-up echocardiogram 6-12 months.  7.  Hematochezia:  This is improving.  Repeat CBC today.  He will FU with GI once his dissection is repaired.    Current medicines are reviewed at length with the patient today.  The patient has concerns regarding medicines.  The following changes have been made:    Increase Labetalol to 400 mg Twice daily    Labs/ tests ordered today include:  Orders Placed This Encounter  Procedures  . Basic Metabolic Panel (BMET)  . CBC w/Diff  . INR/PT  . EKG 12-Lead    Disposition:   FU with Dr. [name] 2 mos.   Signed, Versie Starks, MHS 06/08/2014 10:49 AM    [facility] [place], [place], [place]  [address] Phone: [phone]; Fax: [phone]    ADDENDUM 06/20/2014 1:36 PM  The patient was seen by Dr. Servando Snare for surgical consultation.  He will need a combined sternotomy for arch replacement and descending thoracic aorta stent graft.  Therefore, he will be set up for cardiac  catheterization as outlined above.  Signed,  Richardson Dopp, PA-C   06/20/2014 1:38 PM

## 2014-06-08 ENCOUNTER — Ambulatory Visit (INDEPENDENT_AMBULATORY_CARE_PROVIDER_SITE_OTHER): Payer: Medicare Other | Admitting: Physician Assistant

## 2014-06-08 ENCOUNTER — Encounter: Payer: Self-pay | Admitting: Physician Assistant

## 2014-06-08 VITALS — BP 137/78 | HR 88 | Ht 72.0 in | Wt 152.0 lb

## 2014-06-08 DIAGNOSIS — I359 Nonrheumatic aortic valve disorder, unspecified: Secondary | ICD-10-CM

## 2014-06-08 DIAGNOSIS — I10 Essential (primary) hypertension: Secondary | ICD-10-CM | POA: Diagnosis not present

## 2014-06-08 DIAGNOSIS — N189 Chronic kidney disease, unspecified: Secondary | ICD-10-CM

## 2014-06-08 DIAGNOSIS — I340 Nonrheumatic mitral (valve) insufficiency: Secondary | ICD-10-CM

## 2014-06-08 DIAGNOSIS — I429 Cardiomyopathy, unspecified: Secondary | ICD-10-CM

## 2014-06-08 DIAGNOSIS — I7101 Dissection of thoracic aorta: Secondary | ICD-10-CM

## 2014-06-08 DIAGNOSIS — K921 Melena: Secondary | ICD-10-CM | POA: Diagnosis not present

## 2014-06-08 DIAGNOSIS — I43 Cardiomyopathy in diseases classified elsewhere: Secondary | ICD-10-CM

## 2014-06-08 DIAGNOSIS — I110 Hypertensive heart disease with heart failure: Secondary | ICD-10-CM | POA: Diagnosis not present

## 2014-06-08 DIAGNOSIS — I5042 Chronic combined systolic (congestive) and diastolic (congestive) heart failure: Secondary | ICD-10-CM

## 2014-06-08 DIAGNOSIS — I71012 Dissection of descending thoracic aorta: Secondary | ICD-10-CM

## 2014-06-08 LAB — CBC WITH DIFFERENTIAL/PLATELET
BASOS ABS: 0.1 10*3/uL (ref 0.0–0.1)
Basophils Relative: 1 % (ref 0–1)
EOS PCT: 3 % (ref 0–5)
Eosinophils Absolute: 0.2 10*3/uL (ref 0.0–0.7)
HCT: 30.3 % — ABNORMAL LOW (ref 39.0–52.0)
Hemoglobin: 9.8 g/dL — ABNORMAL LOW (ref 13.0–17.0)
LYMPHS PCT: 29 % (ref 12–46)
Lymphs Abs: 1.6 10*3/uL (ref 0.7–4.0)
MCH: 28.5 pg (ref 26.0–34.0)
MCHC: 32.3 g/dL (ref 30.0–36.0)
MCV: 88.1 fL (ref 78.0–100.0)
Monocytes Absolute: 0.6 10*3/uL (ref 0.1–1.0)
Monocytes Relative: 12 % (ref 3–12)
NEUTROS ABS: 3 10*3/uL (ref 1.7–7.7)
NEUTROS PCT: 55 % (ref 43–77)
PLATELETS: 199 10*3/uL (ref 150–400)
RBC: 3.44 MIL/uL — AB (ref 4.22–5.81)
RDW: 16.1 % — AB (ref 11.5–15.5)
WBC: 5.4 10*3/uL (ref 4.0–10.5)

## 2014-06-08 LAB — BASIC METABOLIC PANEL
BUN: 11 mg/dL (ref 6–23)
CHLORIDE: 107 meq/L (ref 96–112)
CO2: 25 mEq/L (ref 19–32)
Calcium: 9.4 mg/dL (ref 8.4–10.5)
Creat: 1.52 mg/dL — ABNORMAL HIGH (ref 0.50–1.35)
GLUCOSE: 87 mg/dL (ref 70–99)
POTASSIUM: 4.2 meq/L (ref 3.5–5.3)
SODIUM: 139 meq/L (ref 135–145)

## 2014-06-08 LAB — PROTIME-INR
INR: 1.05 (ref <1.50)
Prothrombin Time: 13.8 seconds (ref 11.6–15.2)

## 2014-06-08 MED ORDER — LABETALOL HCL 200 MG PO TABS: 400.0000 mg | ORAL_TABLET | Freq: Two times a day (BID) | ORAL | Status: DC

## 2014-06-08 NOTE — Patient Instructions (Signed)
Your physician has recommended you make the following change in your medication:  1. FINISH THE LABETALOL 300 MG 2. START INCREASED DOSE OF LABETALOL TO 400 MG TWICE DAILY AFTER YOU FINISH THE 300 MG   LAB WORK TODAY; BMET, CBC W/DIFF, PT/INR FOR PRE CATH  FOLLOW UP WITH DR. Aundra Dubin IN 2 MONTHS

## 2014-06-12 ENCOUNTER — Telehealth: Payer: Self-pay | Admitting: *Deleted

## 2014-06-12 DIAGNOSIS — N189 Chronic kidney disease, unspecified: Secondary | ICD-10-CM

## 2014-06-12 DIAGNOSIS — Z01818 Encounter for other preprocedural examination: Secondary | ICD-10-CM

## 2014-06-12 NOTE — Telephone Encounter (Signed)
pt notified of lab results and that it has been recommended for cath to be done per Dr. Servando Snare, Dr. Aundra Dubin and Richardson Dopp, PA to asses for CAD before pt has surgery. I advised pt that Dr. Aundra Dubin and his nurse Webb Silversmith were not in the office until Thursday and that I will d/w them to see when  Dr. Aundra Dubin wants to do pt's heart cath and that we will cb with date and time. Pt said ok and thank you for our help.

## 2014-06-14 ENCOUNTER — Encounter: Payer: Self-pay | Admitting: Cardiothoracic Surgery

## 2014-06-14 ENCOUNTER — Institutional Professional Consult (permissible substitution) (INDEPENDENT_AMBULATORY_CARE_PROVIDER_SITE_OTHER): Payer: Medicare Other | Admitting: Cardiothoracic Surgery

## 2014-06-14 VITALS — BP 133/77 | HR 78 | Resp 20 | Ht 72.0 in | Wt 151.0 lb

## 2014-06-14 DIAGNOSIS — I7100 Dissection of unspecified site of aorta: Secondary | ICD-10-CM

## 2014-06-15 ENCOUNTER — Other Ambulatory Visit: Payer: Self-pay | Admitting: *Deleted

## 2014-06-15 ENCOUNTER — Encounter: Payer: Self-pay | Admitting: *Deleted

## 2014-06-15 ENCOUNTER — Other Ambulatory Visit (INDEPENDENT_AMBULATORY_CARE_PROVIDER_SITE_OTHER): Payer: Medicare Other | Admitting: *Deleted

## 2014-06-15 DIAGNOSIS — I161 Hypertensive emergency: Secondary | ICD-10-CM

## 2014-06-15 DIAGNOSIS — I10 Essential (primary) hypertension: Secondary | ICD-10-CM | POA: Diagnosis not present

## 2014-06-15 DIAGNOSIS — E785 Hyperlipidemia, unspecified: Secondary | ICD-10-CM | POA: Diagnosis not present

## 2014-06-15 DIAGNOSIS — I359 Nonrheumatic aortic valve disorder, unspecified: Secondary | ICD-10-CM

## 2014-06-15 DIAGNOSIS — I119 Hypertensive heart disease without heart failure: Secondary | ICD-10-CM

## 2014-06-15 DIAGNOSIS — Z01818 Encounter for other preprocedural examination: Secondary | ICD-10-CM | POA: Insufficient documentation

## 2014-06-15 LAB — CBC WITH DIFFERENTIAL/PLATELET
BASOS ABS: 0 10*3/uL (ref 0.0–0.1)
Basophils Relative: 0.8 % (ref 0.0–3.0)
Eosinophils Absolute: 0.2 10*3/uL (ref 0.0–0.7)
Eosinophils Relative: 4 % (ref 0.0–5.0)
HEMATOCRIT: 30.1 % — AB (ref 39.0–52.0)
Hemoglobin: 10.1 g/dL — ABNORMAL LOW (ref 13.0–17.0)
LYMPHS ABS: 1.5 10*3/uL (ref 0.7–4.0)
LYMPHS PCT: 39.4 % (ref 12.0–46.0)
MCHC: 33.5 g/dL (ref 30.0–36.0)
MCV: 87.4 fl (ref 78.0–100.0)
MONO ABS: 0.4 10*3/uL (ref 0.1–1.0)
Monocytes Relative: 10.3 % (ref 3.0–12.0)
NEUTROS ABS: 1.8 10*3/uL (ref 1.4–7.7)
Neutrophils Relative %: 45.5 % (ref 43.0–77.0)
Platelets: 215 10*3/uL (ref 150.0–400.0)
RBC: 3.44 Mil/uL — AB (ref 4.22–5.81)
RDW: 17 % — AB (ref 11.5–15.5)
WBC: 3.9 10*3/uL — ABNORMAL LOW (ref 4.0–10.5)

## 2014-06-15 LAB — BASIC METABOLIC PANEL
BUN: 10 mg/dL (ref 6–23)
CHLORIDE: 111 meq/L (ref 96–112)
CO2: 23 meq/L (ref 19–32)
CREATININE: 1.49 mg/dL (ref 0.40–1.50)
Calcium: 9.2 mg/dL (ref 8.4–10.5)
GFR: 66.55 mL/min (ref >60.00)
Glucose, Bld: 99 mg/dL (ref 70–99)
Potassium: 3.8 mEq/L (ref 3.5–5.1)
SODIUM: 138 meq/L (ref 135–145)

## 2014-06-15 LAB — PROTIME-INR
INR: 1.1 ratio — AB (ref 0.8–1.0)
Prothrombin Time: 12.1 s (ref 9.6–13.1)

## 2014-06-15 MED ORDER — MINOXIDIL 2.5 MG PO TABS: 2.5000 mg | ORAL_TABLET | Freq: Two times a day (BID) | ORAL | Status: DC

## 2014-06-15 MED ORDER — LABETALOL HCL 200 MG PO TABS: 400.0000 mg | ORAL_TABLET | Freq: Every day | ORAL | Status: DC

## 2014-06-15 NOTE — Telephone Encounter (Signed)
pt has been scheduled for cath to be done on 4/7 w/Dr. Aundra Dubin @ 12:30. Pt will come in today for pre-procedure lab work, I will then go over instructions with pt later today. Pt agreeable to plan of care.

## 2014-06-18 DIAGNOSIS — E559 Vitamin D deficiency, unspecified: Secondary | ICD-10-CM | POA: Diagnosis not present

## 2014-06-18 DIAGNOSIS — R51 Headache: Secondary | ICD-10-CM | POA: Diagnosis not present

## 2014-06-18 DIAGNOSIS — I10 Essential (primary) hypertension: Secondary | ICD-10-CM | POA: Diagnosis not present

## 2014-06-18 DIAGNOSIS — Z0000 Encounter for general adult medical examination without abnormal findings: Secondary | ICD-10-CM | POA: Diagnosis not present

## 2014-06-18 DIAGNOSIS — Z79899 Other long term (current) drug therapy: Secondary | ICD-10-CM | POA: Diagnosis not present

## 2014-06-20 NOTE — Addendum Note (Signed)
Addended byKathlen Mody, Nicki Reaper T on: 06/20/2014 01:42 PM   Modules accepted: Orders

## 2014-06-20 NOTE — H&P (Signed)
History and Physical    Date:  06/08/2014   ID:  [name], DOB 02-12-1971, MRN [id number]  PCP:  [name], MD  Cardiologist:  Dr. [name]    Chief Complaint  Patient presents with  . Hypertensive Heart Disease     History of Present Illness: [name] is a 44 y.o. male with a hx of HTN, NICM likely related to HTN heart disease, tobacco and ETOH abuse, CKD, valvular heart disease.  Admitted in 05/2011 with hypertensive emergency c/b pulmonary edema.  He was diuresed.  Echo demonstrated EF 35-40% with diff HK and mod to severe MR.  DCM was felt to be due to HTN and ETOH abuse.  LHC was not done due to CKD.  Myoview was negative for ischemia or scar.  Admitted again in 6/15 with HTN emergency (ran out of medications).  Last echo in 02/2013 demonstrated EF 45-50% and mod AI and mild MR.  Last seen by Dr. [name] 11/2013.  He was out of his medications.  These were to be resumed and he was supposed to FU in 1 month.  Admitted 03/2014 with HTN emergency in the setting of non-adherence to medications.  Chest CTA demonstrated stable 4.2 cm ascending aortic aneurysm.   Patient left AMA when he did not receive Morphine for HAs.    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  Dissection arose just distal to the margin of the left subclavian and extended into the abdominal aorta below the level of the renal arteries. There was involvement of the left renal artery with infarction and resultant acute on chronic renal failure (peak creatinine 3.46) . He was followed by critical care, vascular surgery and nephrology.  Vascular surgery recommended improved blood pressure control and repeat CT in 1 month prior to proceeding with surgical repair.  Creatinine improved to 2.27 prior to discharge.  He was discharged home on a regimen of amlodipine 10 mg, clonidine 0.2 mg 3 times a day, hydralazine 100 mg 3 times a day, labetalol 300 mg twice a day,  minoxidil 2.5 mg twice a day, nitroglycerin patch 0.4 mg daily. He has follow-up with nephrology and vascular surgery pending.    I saw him earlier this month and he complained of fatigue and hematochezia. I referred him to gastroenterology. Hemoglobin has remained stable. He will require colonoscopy once his aortic dissection is fixed.    He has seen Dr. Oneida Alar for vascular surgery and follow-up. Plan is to proceed with aortic dissection repair. The procedure would require arch replacement. Dr. Oneida Alar has reviewed this with Dr. Aundra Dubin. Since his arch replacement will most likely require sternotomy, the patient will need to have CAD ruled out. The patient will see Dr. Servando Snare in the next 2-4 weeks to discuss repair.  Chest CTA is pending. We will need to arrange cardiac catheterization it the patient does need sternotomy.  He returns for FU.  He is here with his wife today.  He is overall doing well. He has noted occasional abdominal and lower chest pain since he was admitted with the dissection.  This is more positional than anything else.  He denies exertional symptoms.  He denies DOE.  He denies syncope.  He denies orthopnea, PND, edema.  He is taking all of his medications.     Studies/Reports Reviewed Today:  2-D echocardiogram 03/2014 Severe LVH consistent with hypertrophic cardiomyopathy, EF 50%, normal wall motion Moderate AI Slight dilatation of aortic root (  40 mm) Mild MR Mild RAE  Renal Artery [name] 03/28/14 No evidence of renal artery stenosis noted bilaterally.  Myoview 06/2011 No evidence of ischemia or infarction. Suspect nonischemic cardiomyopathy.  LV Ejection Fraction: 37%  Dg Chest Port 1 View  05/01/2014    IMPRESSION: Slight interval improvement in the left lower lobe atelectasis or pneumonia. Small pleural effusions are less conspicuous. Mild mediastinal widening is unchanged.   Electronically Signed   By: David  Martinique   On: 05/01/2014 07:35   Ct Angio Chest Aorta W/cm  &/or Wo/cm  04/23/2014    IMPRESSION: Type B aortic dissection arising just distal to the origin of the left subclavian artery and extending into the abdominal aorta below the level of the renal arteries. Devascularization of the left kidney. Left renal artery origin and celiac axis origin appear from the false lumen. Superior mesenteric artery and right renal artery origins appear from the true lumen.  These results were called by telephone at the time of interpretation on 04/23/2014 at 10:43 pm to Dr. Quintella Reichert , who verbally acknowledged these results.   Electronically Signed   By: Lucienne Capers M.D.   On: 04/23/2014 22:45     Past Medical History: 1. H/o ankle fractures bilaterally 2. GERD 3. HTN: Negative urinary catecholeamine collection for pheochromocytoma.  4. CKD: Suspect hypertensive nephropathy.  5. Cardiomyopathy: Most likely due to heavy ETOH ingestion and uncontrolled HTN. 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 Lexiscan myoview (4/13): EF 37%, global hypokinesis, no ischemic or infarction. Echo (12/14) with EF 45-50%, diffuse hypokinesis, moderate LVH, mild MR, moderate AI.  6. ETOH abuse: Quit 3/13.  7. Active smoker. 8. Valvular heart disease: Echo in 3/13 showed moderate to severe eccentric MR and mild to moderate AI with prolapsing left coronary cusp. Echo 12/14 with mild MR and moderate AI.  9. Headache with Imdur.  Past Medical History  Diagnosis Date  . Hypertension     a. Hx of HTN urgency secondary to noncompliance. b. urinary metanephrine and catecholeamine levels normal 2013.  [name] Anxiety   . GERD (gastroesophageal reflux disease)   . Acute renal failure     June 2012 felt secondary to Toradol  . Chronic renal failure   . Chronic combined systolic and diastolic congestive heart failure     a. 05/2011: Adm with pulm edema/HTN  urgency, EF 35-40% with diffuse hypokinesis and moderate to severe mitral regurgitation. Cardiomyopathy likely due to uncontrolled HTN and ETOH abuse - cath deferred due to renal insufficiency (felt due to uncontrolled HTN). bJodie Echevaria MV 06/2011: EF 37% and no ischemia or infarction. c. EF 45-50% by echo 01/2012.  . Cardiomyopathy     non-ischemic - probably related to untreated HTN and ETOH abuse  . HYPERLIPIDEMIA   . INGUINAL HERNIA   . ETOH abuse     a. Reported to have quit 05/2011.  . Valvular heart disease     a. Echo 05/2011: moderate to severe eccentric MR and mild to moderate AI with prolapsing left coronary cusp. b. Echo 01/2012: mild-mod AI, mild dilitation of aortic root, mild MR.;  b. 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 CKD (chronic kidney disease)     a. 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  peak creatinine  3.46 during admx for aortic dissection 2/16  . Tobacco abuse   . Pneumonia ~ 2013  . Headache(784.0)     "q other day" (08/08/2013)  . Chronic sinusitis   . History of medication noncompliance   . Chronic abdominal pain   . DDD (degenerative disc disease), lumbar   . Aortic dissection     a. admx 04/2014 >> L renal infarct; a/c renal failure >> VVS to re-eval 05/2014 to decide +/- surgery    Past Surgical History  Procedure Laterality Date  . Ankle surgery      Fractures bilaterally  . Inguinal hernia repair Right ~ 1996  . Foot fracture surgery Bilateral 2004-2010    "got pins in both of them"     Current Outpatient Prescriptions  Medication Sig Dispense Refill  . amLODipine (NORVASC) 10 MG tablet Take 1 tablet (10 mg total) by mouth daily. 30 tablet 1  . cloNIDine (CATAPRES) 0.2 MG tablet Take 1 tablet (0.2 mg total) by mouth 3 (three) times daily. 90 tablet 1  . hydrALAZINE (APRESOLINE) 100 MG tablet Take 1 tablet (100 mg total) by mouth 3 (three) times daily. 90  tablet 1  . hydrocortisone (ANUSOL-HC) 25 MG suppository Use 1 suppository rectally at bedtime. 12 suppository 6  . labetalol (NORMODYNE) 300 MG tablet Take 1 tablet (300 mg total) by mouth 2 (two) times daily. 60 tablet 1  . minoxidil (LONITEN) 2.5 MG tablet Take 1 tablet (2.5 mg total) by mouth 2 (two) times daily. 60 tablet 1  . nitroGLYCERIN (NITRODUR - DOSED IN MG/24 HR) 0.4 mg/hr patch Place 1 patch (0.4 mg total) onto the skin daily. 30 patch 1   No current facility-administered medications for this visit.    Allergies:   Imdur    Social History:  The patient  reports that he has been smoking Cigarettes.  He has a 5.75 pack-year smoking history. He has quit using smokeless tobacco. His smokeless tobacco use included Chew. He reports that he uses illicit drugs (Marijuana). He reports that he does not drink alcohol.   Family History:  The patient's family history includes Colon cancer in his paternal uncle; Diabetes in his maternal aunt; Heart attack in his brother; Hypertension in an other family member; Stroke in his maternal aunt. There is no history of Throat cancer, Pancreatic cancer, Esophageal cancer, Kidney disease, or Liver disease.    ROS:   Please see the history of present illness.   Review of Systems  HENT: Positive for headaches.   Cardiovascular: Positive for chest pain.  Hematologic/Lymphatic: Positive for bleeding problem.  Musculoskeletal: Positive for back pain.  Gastrointestinal: Positive for abdominal pain.       Still has BRBPR "every 3 days" - does have a hard BM; noted to have hemorrhoid at GI - seems to be getting lighter.  Neurological: Positive for dizziness.  All other systems reviewed and are negative.    PHYSICAL EXAM: VS:  BP 137/78 mmHg  Pulse 88  Ht 6' (1.829 m)  Wt 152 lb (68.947 kg)  BMI 20.61 kg/m2     Wt Readings from Last 3 Encounters:  06/08/14 152 lb (68.947 kg)  05/31/14 151 lb 6.4 oz (68.675 kg)  05/22/14 153 lb 1.6 oz (69.446  kg)     GEN: Well nourished, well developed, in no acute distress HEENT: normal Neck: no JVD, no masses Cardiac:  Normal S1/S2, RRR; no murmur, no rubs or gallops, no edema  Respiratory:  Decreased BS bilaterally, no  wheezing, rhonchi or rales. GI: soft,  Diffuse tenderness, nondistended  MS: no deformity or atrophy Skin: warm and dry  Neuro:  CNs II-XII intact, Strength and sensation are intact Psych: Normal affect   EKG:  EKG is ordered today.  It demonstrates:   NSR, HR 88, normal axis, LVH with repol abnormality, QTc 493, no change from prior tracing.    Recent Labs: 09/11/2013: Pro B Natriuretic peptide (BNP) 1981.0* 03/27/2014: B Natriuretic Peptide 550.7* 04/25/2014: TSH 0.724 04/26/2014: ALT 20 05/03/2014: Magnesium 2.1 05/15/2014: BUN 16; Creatinine 1.84*; Potassium 3.8; Sodium 138 05/22/2014: Hemoglobin 10.3*; Platelets 213.0    Lipid Panel    Component Value Date/Time   CHOL 182 03/27/2014 2316   TRIG 49 03/27/2014 2316   HDL 57 03/27/2014 2316   CHOLHDL 3.2 03/27/2014 2316   VLDL 10 03/27/2014 2316   LDLCALC 115* 03/27/2014 2316    ASSESSMENT AND PLAN:  1.  Aortic Dissection:  He has already had FU with Dr. Oneida Alar and sees Dr. Servando Snare next week.  If he needs a median sternotomy, we will need to proceed with LHC to assess for CAD.  I will cc my note to Dr. Oneida Alar and Dr. Servando Snare.  I have reviewed his case today with Dr. [name] by phone.  If he indeed needs a median sternotomy, I will arrange LHC week after next with Dr. [name].  Risks and benefits of cardiac catheterization have been discussed with the patient.  These include bleeding, infection, kidney damage, stroke, heart attack, death.  The patient understands these risks and is willing to proceed, if cath is needed.  We will obtain BMET, INR, CBC today.  2.  HTN:  Controlled. I would like to see his HR lower with his hx of dissection.      -  Increase Labetalol to 400 mg Twice daily  3.  Non-Ischemic  CM: Recent echocardiogram with ejection fraction 50%. He is not a candidate for ACE inhibitor given chronic kidney disease, at this time.  We can certainly consider ACE inhibitor or angiotensin receptor blocker if his renal fxn improves or remains stable.  Continue hydralazine, nitrates, beta blocker.   4.  Chronic Combined Systolic and Diastolic CHF:  Volume overall appears stable. Continue current therapy.  5.  Chronic Kidney Disease: Follow-up with nephrology as planned.   6.  Valvular Heart Disease:  Mild MR and Mod AI by recent echo. Plan to order follow-up echocardiogram 6-12 months.  7.  Hematochezia:  This is improving.  Repeat CBC today.  He will FU with GI once his dissection is repaired.    Current medicines are reviewed at length with the patient today.  The patient has concerns regarding medicines.  The following changes have been made:    Increase Labetalol to 400 mg Twice daily    Labs/ tests ordered today include:  Orders Placed This Encounter  Procedures  . Basic Metabolic Panel (BMET)  . CBC w/Diff  . INR/PT  . EKG 12-Lead    Disposition:   FU with Dr. [name] 2 mos.   Signed, Versie Starks, MHS 06/08/2014 10:49 AM    [facility] [place], [place], [place]  [address] Phone: [phone]; Fax: [phone]    ADDENDUM 06/20/2014 1:36 PM  The patient was seen by Dr. Servando Snare for surgical consultation.  He will need a combined sternotomy for arch replacement and descending thoracic aorta stent graft.  Therefore, he will be set up  for cardiac catheterization as outlined above.  Signed,  Richardson Dopp, PA-C   06/20/2014 1:38 PM

## 2014-06-21 ENCOUNTER — Encounter (HOSPITAL_COMMUNITY)
Admission: RE | Disposition: A | Discharge status: Home / Self Care | Payer: Self-pay | Source: Ambulatory Visit | Attending: Cardiology

## 2014-06-21 ENCOUNTER — Ambulatory Visit (HOSPITAL_COMMUNITY)
Admission: RE | Admit: 2014-06-21 | Discharge: 2014-06-21 | Disposition: A | Discharge status: Home / Self Care | Payer: Medicare Other | Source: Ambulatory Visit | Attending: Cardiology | Admitting: Cardiology

## 2014-06-21 ENCOUNTER — Ambulatory Visit: Payer: Medicare Other | Admitting: Vascular Surgery

## 2014-06-21 ENCOUNTER — Encounter (HOSPITAL_COMMUNITY): Payer: Self-pay | Admitting: Cardiology

## 2014-06-21 DIAGNOSIS — I38 Endocarditis, valve unspecified: Secondary | ICD-10-CM | POA: Diagnosis not present

## 2014-06-21 DIAGNOSIS — I7123 Aneurysm of the descending thoracic aorta, without rupture: Secondary | ICD-10-CM | POA: Diagnosis present

## 2014-06-21 DIAGNOSIS — I2510 Atherosclerotic heart disease of native coronary artery without angina pectoris: Secondary | ICD-10-CM | POA: Insufficient documentation

## 2014-06-21 DIAGNOSIS — I110 Hypertensive heart disease with heart failure: Secondary | ICD-10-CM

## 2014-06-21 DIAGNOSIS — I7100 Dissection of unspecified site of aorta: Secondary | ICD-10-CM | POA: Insufficient documentation

## 2014-06-21 DIAGNOSIS — E785 Hyperlipidemia, unspecified: Secondary | ICD-10-CM | POA: Diagnosis not present

## 2014-06-21 DIAGNOSIS — I7101 Dissection of thoracic aorta: Secondary | ICD-10-CM | POA: Diagnosis present

## 2014-06-21 DIAGNOSIS — F17220 Nicotine dependence, chewing tobacco, uncomplicated: Secondary | ICD-10-CM | POA: Diagnosis not present

## 2014-06-21 DIAGNOSIS — F419 Anxiety disorder, unspecified: Secondary | ICD-10-CM | POA: Diagnosis not present

## 2014-06-21 DIAGNOSIS — K921 Melena: Secondary | ICD-10-CM | POA: Insufficient documentation

## 2014-06-21 DIAGNOSIS — K219 Gastro-esophageal reflux disease without esophagitis: Secondary | ICD-10-CM | POA: Diagnosis not present

## 2014-06-21 DIAGNOSIS — I71012 Dissection of descending thoracic aorta: Secondary | ICD-10-CM | POA: Diagnosis present

## 2014-06-21 DIAGNOSIS — I5042 Chronic combined systolic (congestive) and diastolic (congestive) heart failure: Secondary | ICD-10-CM | POA: Insufficient documentation

## 2014-06-21 DIAGNOSIS — I129 Hypertensive chronic kidney disease with stage 1 through stage 4 chronic kidney disease, or unspecified chronic kidney disease: Secondary | ICD-10-CM | POA: Insufficient documentation

## 2014-06-21 DIAGNOSIS — Z8701 Personal history of pneumonia (recurrent): Secondary | ICD-10-CM | POA: Insufficient documentation

## 2014-06-21 DIAGNOSIS — I429 Cardiomyopathy, unspecified: Secondary | ICD-10-CM | POA: Diagnosis not present

## 2014-06-21 DIAGNOSIS — F1210 Cannabis abuse, uncomplicated: Secondary | ICD-10-CM | POA: Diagnosis not present

## 2014-06-21 DIAGNOSIS — Z8781 Personal history of (healed) traumatic fracture: Secondary | ICD-10-CM | POA: Diagnosis not present

## 2014-06-21 DIAGNOSIS — I43 Cardiomyopathy in diseases classified elsewhere: Secondary | ICD-10-CM

## 2014-06-21 DIAGNOSIS — N189 Chronic kidney disease, unspecified: Secondary | ICD-10-CM | POA: Insufficient documentation

## 2014-06-21 DIAGNOSIS — Z9114 Patient's other noncompliance with medication regimen: Secondary | ICD-10-CM | POA: Insufficient documentation

## 2014-06-21 HISTORY — PX: LEFT HEART CATHETERIZATION WITH CORONARY ANGIOGRAM: SHX5451

## 2014-06-21 SURGERY — LEFT HEART CATHETERIZATION WITH CORONARY ANGIOGRAM
Anesthesia: LOCAL

## 2014-06-21 MED ORDER — VERAPAMIL HCL 2.5 MG/ML IV SOLN
INTRAVENOUS | Status: AC
  Filled 2014-06-21: qty 2

## 2014-06-21 MED ORDER — SODIUM CHLORIDE 0.9 % IJ SOLN: 3.0000 mL | Freq: Two times a day (BID) | INTRAMUSCULAR | Status: DC

## 2014-06-21 MED ORDER — ASPIRIN 81 MG PO CHEW
CHEWABLE_TABLET | ORAL | Status: AC
  Administered 2014-06-21: 81 mg via ORAL
  Filled 2014-06-21: qty 1

## 2014-06-21 MED ORDER — ACETAMINOPHEN 325 MG PO TABS
ORAL_TABLET | ORAL | Status: AC
  Filled 2014-06-21: qty 2

## 2014-06-21 MED ORDER — LIDOCAINE HCL (PF) 1 % IJ SOLN
INTRAMUSCULAR | Status: AC
  Filled 2014-06-21: qty 30

## 2014-06-21 MED ORDER — SODIUM CHLORIDE 0.9 % IJ SOLN: 3.0000 mL | INTRAMUSCULAR | Status: DC | PRN

## 2014-06-21 MED ORDER — HEPARIN (PORCINE) IN NACL 2-0.9 UNIT/ML-% IJ SOLN
INTRAMUSCULAR | Status: AC
  Filled 2014-06-21: qty 1000

## 2014-06-21 MED ORDER — SODIUM CHLORIDE 0.9 % IV SOLN: 250.0000 mL | INTRAVENOUS | Status: DC | PRN

## 2014-06-21 MED ORDER — HEPARIN SODIUM (PORCINE) 1000 UNIT/ML IJ SOLN
INTRAMUSCULAR | Status: AC
  Filled 2014-06-21: qty 1

## 2014-06-21 MED ORDER — FENTANYL CITRATE 0.05 MG/ML IJ SOLN
INTRAMUSCULAR | Status: AC
  Filled 2014-06-21: qty 2

## 2014-06-21 MED ORDER — ASPIRIN 81 MG PO CHEW
81.0000 mg | CHEWABLE_TABLET | ORAL | Status: AC
  Administered 2014-06-21: 81 mg via ORAL

## 2014-06-21 MED ORDER — ONDANSETRON HCL 4 MG/2ML IJ SOLN: 4.0000 mg | Freq: Four times a day (QID) | INTRAMUSCULAR | Status: DC | PRN

## 2014-06-21 MED ORDER — SODIUM CHLORIDE 0.9 % IV SOLN
INTRAVENOUS | Status: DC
  Administered 2014-06-21: 11:00:00 via INTRAVENOUS

## 2014-06-21 MED ORDER — SODIUM CHLORIDE 0.9 % IV SOLN: INTRAVENOUS | Status: AC

## 2014-06-21 MED ORDER — MIDAZOLAM HCL 2 MG/2ML IJ SOLN
INTRAMUSCULAR | Status: AC
  Filled 2014-06-21: qty 2

## 2014-06-21 MED ORDER — ACETAMINOPHEN 325 MG PO TABS
650.0000 mg | ORAL_TABLET | ORAL | Status: DC | PRN
  Administered 2014-06-21: 650 mg via ORAL

## 2014-06-21 MED ORDER — NITROGLYCERIN 1 MG/10 ML FOR IR/CATH LAB
INTRA_ARTERIAL | Status: AC
  Filled 2014-06-21: qty 10

## 2014-06-21 NOTE — Interval H&P Note (Signed)
History and Physical Interval Note:  06/21/2014 1:23 PM  [name]  has presented today for surgery, with the diagnosis of cp  The various methods of treatment have been discussed with the patient and family. After consideration of risks, benefits and other options for treatment, the patient has consented to  Procedure(s): LEFT HEART CATHETERIZATION WITH CORONARY ANGIOGRAM (N/A) as a surgical intervention .  The patient's history has been reviewed, patient examined, no change in status, stable for surgery.  I have reviewed the patient's chart and labs.  Questions were answered to the patient's satisfaction.     Daune Divirgilio [employer]

## 2014-06-21 NOTE — CV Procedure (Signed)
    Cardiac Catheterization Procedure Note  Name: [name] MRN: [id number] DOB: 07-17-1970  Procedure: Left Heart Cath, Selective Coronary Angiography  Indication: Aortic dissection, pre-surgery   Procedural Details: The right wrist was prepped, draped, and anesthetized with 1% lidocaine. Using the modified Seldinger technique, a 6 French Slender sheath was introduced into the right radial artery. 3 mg of verapamil was administered through the sheath, weight-based unfractionated heparin was administered intravenously. JR4 was used to engage the RCA, 69F JR5 guide was used to engage the LCA (somewhat difficult). Catheter exchanges were performed over an exchange length guidewire. There were no immediate procedural complications. A TR band was used for radial hemostasis at the completion of the procedure.  The patient was transferred to the post catheterization recovery area for further monitoring.  Procedural Findings: Hemodynamics: AO 147/83 LV 148/8  Coronary angiography: Coronary dominance: right  Left mainstem: No significant disease.   Left anterior descending (LAD): Large early D1 with 60% ostial stenosis. Luminal irregularities in the remainder of the LAD.   Left circumflex (LCx): Luminal irregularities.   Right coronary artery (RCA): Luminal irregularities  Left ventriculography: Not done (CKD)  Final Conclusions:  Moderate disease in a large D1 (60%).  Otherwise, no significant disease.  As D1 stenosis not flow limiting, probably would not bypass with surgery.  However, I will ask him to start ASA 81 daily and atorvastatin 20 daily.  Will need lipids/LFTs in 2 months.  Given CKD, will keep for 5 hours to hydrate at 125 cc/hr.   [name] MD, [facility] 06/21/2014, 2:20 PM

## 2014-06-21 NOTE — Discharge Instructions (Signed)
Radial Site Care °Refer to this sheet in the next few weeks. These instructions provide you with information on caring for yourself after your procedure. Your caregiver may also give you more specific instructions. Your treatment has been planned according to current medical practices, but problems sometimes occur. Call your caregiver if you have any problems or questions after your procedure. °HOME CARE INSTRUCTIONS °· You may shower the day after the procedure. Remove the bandage (dressing) and gently wash the site with plain soap and water. Gently pat the site dry. °· Do not apply powder or lotion to the site. °· Do not submerge the affected site in water for 3 to 5 days. °· Inspect the site at least twice daily. °· Do not flex or bend the affected arm for 24 hours. °· No lifting over 5 pounds (2.3 kg) for 5 days after your procedure. °· Do not drive home if you are discharged the same day of the procedure. Have someone else drive you. °· You may drive 24 hours after the procedure unless otherwise instructed by your caregiver. °· Do not operate machinery or power tools for 24 hours. °· A responsible adult should be with you for the first 24 hours after you arrive home. °What to expect: °· Any bruising will usually fade within 1 to 2 weeks. °· Blood that collects in the tissue (hematoma) may be painful to the touch. It should usually decrease in size and tenderness within 1 to 2 weeks. °SEEK IMMEDIATE MEDICAL CARE IF: °· You have unusual pain at the radial site. °· You have redness, warmth, swelling, or pain at the radial site. °· You have drainage (other than a small amount of blood on the dressing). °· You have chills. °· You have a fever or persistent symptoms for more than 72 hours. °· You have a fever and your symptoms suddenly get worse. °· Your arm becomes pale, cool, tingly, or numb. °· You have heavy bleeding from the site. Hold pressure on the site. CALL 911 °Document Released: 04/04/2010 Document  Revised: 05/25/2011 Document Reviewed: 04/04/2010 °[employer]® Patient Information ©2015 [employer]. This information is not intended to replace advice given to you by your health care provider. Make sure you discuss any questions you have with your health care provider. ° °

## 2014-06-21 NOTE — Progress Notes (Signed)
Pt was hypertensive in Cath lab. During report I asked if anything further for BP was needed. No orders followed.

## 2014-06-25 ENCOUNTER — Other Ambulatory Visit: Payer: Self-pay | Admitting: *Deleted

## 2014-06-25 DIAGNOSIS — I7100 Dissection of unspecified site of aorta: Secondary | ICD-10-CM

## 2014-06-25 DIAGNOSIS — R079 Chest pain, unspecified: Secondary | ICD-10-CM

## 2014-06-25 NOTE — Progress Notes (Signed)
[address]       [name]             [phone]                    [name] [facility] Record [id number] Date of Birth: 02-17-1971  Referring: Elam Dutch, MD Primary Care: [name], MD  Chief Complaint:    Chief Complaint  Patient presents with  . Thoracic Aortic Dissection    CTA Chest 04/23/14, Echo 03/28/14    History of Present Illness:    [name] 44 y.o. 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 Pt has h history of several years of poor blood pressure control presented to ER sudden back pain that then radiated into chest and left flank. No paresthesias in lower extremities or syncope. No pain in feet or legs no vomiting or bloody diarrhea or abdominal pain. Pt has had multiple prior admissions with BP in the 200s over the last several years. He developed renal failure which has improved. Now being evaluated for stent grafting of type 3 aortic dissection. He notes some low back pain and occsional sore stomach since d/c. He has been taking bp meds.       Current Activity/ Functional Status:  Patient is independent with mobility/ambulation, transfers, ADL's, IADL's.   Zubrod Score: At the time of surgery this patient's most appropriate activity status/level should be described as: []     0    Normal activity, no symptoms []     1    Restricted in physical strenuous activity but ambulatory, able to do out light work []     2    Ambulatory and capable of self care, unable to do work activities, up and about               >50 % of waking hours                              []     3    Only limited self care, in bed greater than 50% of waking hours []     4    Completely disabled, no self care, confined to bed or chair []     5    Moribund   Past Medical History  Diagnosis Date   . Hypertension     a. Hx of HTN urgency secondary to noncompliance. b. urinary metanephrine and catecholeamine levels normal 2013.  [name] Anxiety   . GERD (gastroesophageal reflux disease)   . Acute renal failure     June 2012 felt secondary to Toradol  . Chronic renal failure   . Chronic combined systolic and diastolic congestive heart failure     a. 05/2011: Adm with pulm edema/HTN urgency, EF 35-40% with diffuse hypokinesis and moderate to severe mitral regurgitation. Cardiomyopathy likely due to uncontrolled HTN and ETOH abuse - cath deferred due to renal insufficiency (felt due to uncontrolled HTN). bJodie Ewing MV 06/2011: EF 37% and no ischemia or infarction. c. EF 45-50% by echo 01/2012.  . Cardiomyopathy     non-ischemic - probably related to untreated HTN and ETOH abuse  . HYPERLIPIDEMIA   . INGUINAL HERNIA   . ETOH abuse  a. Reported to have quit 05/2011.  . Valvular heart disease     a. Echo 05/2011: moderate to severe eccentric MR and mild to moderate AI with prolapsing left coronary cusp. b. Echo 01/2012: mild-mod AI, mild dilitation of aortic root, mild MR.;  b. 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 CKD (chronic kidney disease)     a. 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  peak creatinine 3.46 during admx for aortic dissection 2/16  . Tobacco abuse   . Pneumonia ~ 2013  . Headache(784.0)     "q other day" (08/08/2013)  . Chronic sinusitis   . History of medication noncompliance   . Chronic abdominal pain   . DDD (degenerative disc disease), lumbar   . Aortic dissection     a. admx 04/2014 >> L renal infarct; a/c renal failure >> VVS to re-eval 05/2014 to decide +/- surgery    Past Surgical History  Procedure Laterality Date  . Ankle surgery      Fractures bilaterally  . Inguinal hernia repair Right ~ 1996  . Foot fracture surgery Bilateral 2004-2010    "got pins in both of them"   . Left heart catheterization with coronary angiogram N/A 06/21/2014    Procedure: LEFT HEART CATHETERIZATION WITH CORONARY ANGIOGRAM;  Surgeon: Larey Dresser, MD;  Location: [facility] CATH LAB;  Service: Cardiovascular;  Laterality: N/A;    Family History  Problem Relation Age of Onset  . Hypertension    . Colon cancer Paternal Uncle   . Stroke Maternal Aunt   . Heart attack Brother   . Diabetes Maternal Aunt   . Throat cancer Neg Hx   . Pancreatic cancer Neg Hx   . Esophageal cancer Neg Hx   . Kidney disease Neg Hx   . Liver disease Neg Hx     History   Social History  . Marital Status: Married    Spouse Name: N/A  . Number of Children: 5  . Years of Education: N/A   Occupational History  . Disabled, part-time Programmer, systems     Disability   Social History Main Topics  . Smoking status: Current Every Day Smoker -- 0.25 packs/day for 23 years    Types: Cigarettes    Last Attempt to Quit: 05/03/2014  . Smokeless tobacco: Former Systems developer    Types: Chew     Comment: as of 05-22-14 down to 3-4 cigs daily  . Alcohol Use: No     Comment: + previous use  . Drug Use: Yes    Special: Marijuana     Comment: 08/08/2013 "twice a month"  . Sexual Activity: Yes   Other Topics Concern  . Not on file   Social History Narrative    History  Smoking status  . Current Every Day Smoker -- 0.25 packs/day for 23 years  . Types: Cigarettes  . Last Attempt to Quit: 05/03/2014  Smokeless tobacco  . Former Systems developer  . Types: Chew    Comment: as of 05-22-14 down to 3-4 cigs daily    History  Alcohol Use No    Comment: + previous use     Allergies  Allergen Reactions  . Imdur [Isosorbide] Other (See Comments)    headache    Current Outpatient Prescriptions  Medication Sig Dispense Refill  . cloNIDine (CATAPRES) 0.2 MG tablet Take 1 tablet (0.2 mg total) by mouth 3 (  three) times daily. 90 tablet 1  . hydrALAZINE (APRESOLINE) 100 MG tablet Take 1 tablet (100 mg total) by mouth 3 (three)  times daily. 90 tablet 1  . nitroGLYCERIN (NITRODUR - DOSED IN MG/24 HR) 0.4 mg/hr patch Place 1 patch (0.4 mg total) onto the skin daily. 30 patch 1  . labetalol (NORMODYNE) 200 MG tablet Take 2 tablets (400 mg total) by mouth daily. 60 tablet 11  . minoxidil (LONITEN) 2.5 MG tablet Take 1 tablet (2.5 mg total) by mouth 2 (two) times daily. 60 tablet 3   No current facility-administered medications for this visit.      Review of Systems:     Cardiac Review of Systems: Y or N  Chest Pain [  n  ]  Resting SOB [ n  ] Exertional SOB  [n  ]  Orthopnea [ n ]   Pedal Edema [ n  ]    Palpitations [n  ] Syncope  [ n ]   Presyncope [ n  ]  General Review of Systems: [Y] = yes [  ]=no Constitional: recent weight change [ n ];  Wt loss over the last 3 months [   ] anorexia [  ]; fatigue [  ]; nausea [  ]; night sweats [  ]; fever [  ]; or chills [  ];          Dental: poor dentition[  ]; Last Dentist visit:   Eye : blurred vision [  ]; diplopia [   ]; vision changes [  ];  Amaurosis fugax[  ]; Resp: cough [  ];  wheezing[  ];  hemoptysis[n  ]; shortness of breath[n  ]; paroxysmal nocturnal dyspnea[  ]; dyspnea on exertion[  ]; or orthopnea[  ];  GI:  gallstones[  ], vomiting[  ];  dysphagia[  ]; melena[  ];  hematochezia [  ]; heartburn[  ];   Hx of  Colonoscopy[  ]; GU: kidney stones [  ]; hematuria[  ];   dysuria [  ];  nocturia[  ];  history of     obstruction [  ]; urinary frequency [n  ]             Skin: rash, swelling[  ];, hair loss[  ];  peripheral edema[  ];  or itching[  ]; Musculosketetal: myalgias[  ];  joint swelling[  ];  joint erythema[  ];  joint pain[  ];  back pain[  ];  Heme/Lymph: bruising[  ];  bleeding[  ];  anemia[  ];  Neuro: TIA[  ];  headaches[  ];  stroke[  ];  vertigo[  ];  seizures[  ];   paresthesias[  ];  difficulty walking[  ];  Psych:depression[  ]; anxiety[  ];  Endocrine: diabetes[  ];  thyroid dysfunction[  ];  Immunizations: Flu up to date [  ]; Pneumococcal up  to date [  ];  Other:  Physical Exam: BP 133/77 mmHg  Pulse 78  Resp 20  Ht 6' (1.829 m)  Wt 151 lb (68.493 kg)  BMI 20.47 kg/m2  SpO2 98%  PHYSICAL EXAMINATION: General appearance: alert, cooperative and appears older than stated age Head: Normocephalic, without obvious abnormality, atraumatic Neck: no adenopathy, no carotid bruit, no JVD, supple, symmetrical, trachea midline and thyroid not enlarged, symmetric, no tenderness/mass/nodules Lymph nodes: Cervical, supraclavicular, and axillary nodes normal. Resp: clear to auscultation bilaterally Back: symmetric, no curvature. ROM normal. No CVA tenderness. Cardio: diastolic murmur:  holodiastolic 3/6, crescendo throughout the precordium GI: soft, non-tender; bowel sounds normal; no masses,  no organomegaly Extremities: extremities normal, atraumatic, no cyanosis or edema and Homans sign is negative, no sign of DVT Neurologic: Grossly normal Palpable pedal pulses, femoral pulses and brachial pulses equal Diagnostic Studies & Laboratory data:     Recent Radiology Findings:   No results found.   I have independently reviewed the above radiologic studies.  Recent Lab Findings: Lab Results  Component Value Date   WBC 3.9* 06/15/2014   HGB 10.1* 06/15/2014   HCT 30.1* 06/15/2014   PLT 215.0 06/15/2014   GLUCOSE 99 06/15/2014   CHOL 182 03/27/2014   TRIG 49 03/27/2014   HDL 57 03/27/2014   LDLCALC 115* 03/27/2014   ALT 20 04/26/2014   AST 22 04/26/2014   NA 138 06/15/2014   K 3.8 06/15/2014   CL 111 06/15/2014   CREATININE 1.49 06/15/2014   BUN 10 06/15/2014   CO2 23 06/15/2014   TSH 0.724 04/25/2014   INR 1.1* 06/15/2014   HGBA1C 5.3 03/27/2014   Chronic Kidney Disease   Stage I     GFR >90  Stage II    GFR 60-89  Stage IIIA GFR 45-59  Stage IIIB GFR 30-44  Stage IV   GFR 15-29  Stage V    GFR  <15  Lab Results  Component Value Date   CREATININE 1.49 06/15/2014   Estimated Creatinine Clearance: 63.2 mL/min  (by C-G formula based on Cr of 1.49).   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   I  spent 40 minutes counseling the patient face to face and 50% or more the  time was spent in counseling and coordination of care. The total time spent in the appointment was 60 minutes.  Grace Isaac MD      [address] [place] [address] Office [phone]   Beeper [phone]  06/14/2014

## 2014-06-27 ENCOUNTER — Ambulatory Visit (INDEPENDENT_AMBULATORY_CARE_PROVIDER_SITE_OTHER): Payer: Medicare Other | Admitting: Vascular Surgery

## 2014-06-27 ENCOUNTER — Encounter: Payer: Self-pay | Admitting: Vascular Surgery

## 2014-06-27 VITALS — BP 134/76 | HR 82 | Resp 18 | Ht 72.0 in | Wt 152.0 lb

## 2014-06-27 DIAGNOSIS — I71019 Dissection of thoracic aorta, unspecified: Secondary | ICD-10-CM

## 2014-06-27 DIAGNOSIS — I7101 Dissection of thoracic aorta: Secondary | ICD-10-CM | POA: Diagnosis not present

## 2014-06-27 NOTE — Progress Notes (Signed)
[facility] HISTORY AND PHYSICAL    History of Present Illness:  Patient is a 44 y.o. year old male who presents for evaluation of recent type B aortic dissection. This extended all the way to the level of the left subclavian artery and it was not amenable to stent graft repair. 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  He presented with chest pain which has now essentially resolved with occasional mild problems relieved usually with Tylenol. He states he has been compliant with his blood pressure medications. He states his blood pressure runs in the 0000000 systolic at home. I emphasized to him that it will be very important for him to continue to take his blood pressure at home.  Other medical problems include anxiety, hyperlipidemia, CK D with most recent creatinine 1.5 approximate 1 weeks ago. He recently had cardiac catheterization by Dr. Benjamine Mola which showed mild coronary disease. He was placed on a cholesterol-lowering agent.    Past Medical History   Diagnosis  Date   .  Hypertension         a. Hx of HTN urgency secondary to noncompliance. b. urinary metanephrine and catecholeamine levels normal 2013.   [name]  Anxiety     .  GERD (gastroesophageal reflux disease)     .  Acute renal failure         June 2012 felt secondary to Toradol   .  Chronic renal failure     .  Chronic combined systolic and diastolic congestive heart failure         a. 05/2011: Adm with pulm edema/HTN urgency, EF 35-40% with diffuse hypokinesis and moderate to severe mitral regurgitation. Cardiomyopathy likely due to uncontrolled HTN and ETOH abuse - cath deferred due to renal insufficiency (felt due to uncontrolled HTN). bJodie Echevaria MV 06/2011: EF 37% and no ischemia or infarction. c. EF 45-50% by echo 01/2012.   .  Cardiomyopathy         non-ischemic - probably related to untreated HTN and ETOH abuse   .  HYPERLIPIDEMIA     .  INGUINAL HERNIA     .  ETOH abuse         a. Reported to  have quit 05/2011.   .  Valvular heart disease         a. Echo 05/2011: moderate to severe eccentric MR and mild to moderate AI with prolapsing left coronary cusp. b. Echo 01/2012: mild-mod AI, mild dilitation of aortic root, mild MR.;  b. 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  CKD (chronic kidney disease)         a. 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  peak creatinine 3.46 during admx for aortic dissection 2/16   .  Tobacco abuse     .  Pneumonia  ~ 2013   .  Headache(784.0)         "q other day" (08/08/2013)   .  Chronic sinusitis     .  History of medication noncompliance     .  Chronic abdominal pain     .  DDD (degenerative disc disease), lumbar     .  Aortic dissection         a. admx 04/2014 >> L renal infarct; a/c renal failure >> VVS to re-eval 05/2014 to decide +/- surgery  Past Surgical History   Procedure  Laterality  Date   .  Ankle surgery           Fractures bilaterally   .  Inguinal hernia repair  Right  ~ 1996   .  Foot fracture surgery  Bilateral  2004-2010       "got pins in both of them"     Social History History   Substance Use Topics   .  Smoking status:  Current Every Day Smoker -- 0.25 packs/day for 23 years       Types:  Cigarettes       Last Attempt to Quit:  05/03/2014   .  Smokeless tobacco:  Former Systems developer       Types:  Chew         Comment: as of 05-22-14 down to 3-4 cigs daily   .  Alcohol Use:  No         Comment: + previous use     Family History Family History   Problem  Relation  Age of Onset   .  Hypertension       .  Colon cancer  Paternal Uncle     .  Stroke  Maternal Aunt     .  Heart attack  Brother     .  Diabetes  Maternal Aunt     .  Throat cancer  Neg Hx     .  Pancreatic cancer  Neg Hx     .  Esophageal cancer  Neg Hx     .  Kidney disease  Neg Hx     .  Liver disease  Neg Hx       Allergies    Allergies   Allergen   Reactions   .  Imdur [Isosorbide]  Other (See Comments)       headache         Current Outpatient Prescriptions on File Prior to Visit  Medication Sig Dispense Refill  . cloNIDine (CATAPRES) 0.2 MG tablet Take 1 tablet (0.2 mg total) by mouth 3 (three) times daily. 90 tablet 1  . hydrALAZINE (APRESOLINE) 100 MG tablet Take 1 tablet (100 mg total) by mouth 3 (three) times daily. 90 tablet 1  . labetalol (NORMODYNE) 200 MG tablet Take 2 tablets (400 mg total) by mouth daily. 60 tablet 11  . minoxidil (LONITEN) 2.5 MG tablet Take 1 tablet (2.5 mg total) by mouth 2 (two) times daily. 60 tablet 3  . nitroGLYCERIN (NITRODUR - DOSED IN MG/24 HR) 0.4 mg/hr patch Place 1 patch (0.4 mg total) onto the skin daily. 30 patch 1   No current facility-administered medications on file prior to visit.   ROS:    General:  No weight loss, Fever, chills  HEENT: No recent headaches, no nasal bleeding, no visual changes, no sore throat  Neurologic: No dizziness, blackouts, seizures. No recent symptoms of stroke or mini- stroke. No recent episodes of slurred speech, or temporary blindness.  Cardiac: No recent episodes of chest pain/pressure, no shortness of breath at rest.  No shortness of breath with exertion.  Denies history of atrial fibrillation or irregular heartbeat  Vascular: No history of rest pain in feet.  No history of claudication.  No history of non-healing ulcer, No history of DVT    Pulmonary: No home oxygen, no productive cough, no hemoptysis,  No asthma or wheezing  Musculoskeletal:  [ ]  Arthritis, [ ]  Low back pain,  [ ]  Joint  pain  Hematologic:No history of hypercoagulable state.  No history of easy bleeding.  No history of anemia  Gastrointestinal: No hematochezia or melena,  No gastroesophageal reflux, no trouble swallowing  Urinary: [ x] chronic Kidney disease, [ ]  on HD - [ ]  MWF or [ ]  TTHS, [ ]  Burning with urination, [ ]  Frequent urination, [ ]  Difficulty urinating;    Skin:  No rashes  Psychological: + history of anxiety,  No history of depression   Physical Examination     Filed Vitals:   06/27/14 1121  BP: 134/76  Pulse: 82  Resp: 18  Height: 6' (1.829 m)  Weight: 152 lb (68.947 kg)   General:  Alert and oriented, no acute distress HEENT: Normal Neck: No bruit or JVD Pulmonary: Clear to auscultation bilaterally Cardiac: Regular Rate and Rhythm 2/6 high-pitched murmur left second interspace Abdomen: Soft, non-tender, non-distended, no mass Skin: No rash Extremity Pulses:  2+ radial, brachial, 2+ femoral, dorsalis pedis pulses bilaterally Musculoskeletal: No deformity or edema     Neurologic: Upper and lower extremity motor 5/5 and symmetric  Data: I reviewed his prior CT scan from February. This shows a type B dissection. The celiac artery comes off the false lumen which is a larger lumen. The superior mesenteric artery and right renal arteries come off the true lumen although this is a smaller lumen. There was no extension into the iliac arteries.    ASSESSMENT:  Type B aortic dissection currently stable. The patient has decided he would like to have the dissection repaired. I discussed with him today that this would require arch replacement as well as then possible stent grafting of the descending thoracic portion. The patient understands that this would be a significant procedure with high morbidity and possible mortality. I discussed with him the possibility of stroke paraplegia blood transfusion wound infection possible death with the operation. In light of this we are going to need better imaging with contrast.   PLAN:  The patient is scheduled for CT angiogram chest abdomen pelvis on April 15. He is also seeing Dr. Servando Snare that day. We will begin operative planning after his next imaging study. The patient was again advised that he has severe chest pain that has not improved with Tylenol alone he should report to the emergency room.  Ruta Hinds, MD [facility] Office: [phone] Pager: [phone]

## 2014-06-28 ENCOUNTER — Ambulatory Visit: Payer: Medicare Other | Admitting: Vascular Surgery

## 2014-06-29 ENCOUNTER — Ambulatory Visit
Admission: RE | Admit: 2014-06-29 | Discharge: 2014-06-29 | Disposition: A | Discharge status: Home / Self Care | Payer: Medicare Other | Source: Ambulatory Visit | Attending: Cardiothoracic Surgery | Admitting: Cardiothoracic Surgery

## 2014-06-29 ENCOUNTER — Ambulatory Visit (HOSPITAL_COMMUNITY)
Admission: RE | Admit: 2014-06-29 | Discharge: 2014-06-29 | Disposition: A | Discharge status: Home / Self Care | Payer: Medicare Other | Source: Ambulatory Visit | Attending: Internal Medicine | Admitting: Internal Medicine

## 2014-06-29 DIAGNOSIS — R079 Chest pain, unspecified: Secondary | ICD-10-CM | POA: Diagnosis not present

## 2014-06-29 DIAGNOSIS — R0789 Other chest pain: Secondary | ICD-10-CM | POA: Diagnosis not present

## 2014-06-29 DIAGNOSIS — I714 Abdominal aortic aneurysm, without rupture: Secondary | ICD-10-CM | POA: Diagnosis not present

## 2014-06-29 DIAGNOSIS — I7100 Dissection of unspecified site of aorta: Secondary | ICD-10-CM | POA: Diagnosis not present

## 2014-06-29 DIAGNOSIS — I517 Cardiomegaly: Secondary | ICD-10-CM | POA: Diagnosis not present

## 2014-06-29 DIAGNOSIS — I7101 Dissection of thoracic aorta: Secondary | ICD-10-CM | POA: Diagnosis not present

## 2014-06-29 DIAGNOSIS — I712 Thoracic aortic aneurysm, without rupture: Secondary | ICD-10-CM | POA: Diagnosis not present

## 2014-06-29 MED ORDER — IOPAMIDOL (ISOVUE-370) INJECTION 76%
75.0000 mL | Freq: Once | INTRAVENOUS | Status: AC | PRN
  Administered 2014-06-29: 75 mL via INTRAVENOUS

## 2014-06-29 NOTE — Progress Notes (Signed)
  Echocardiogram 2D Echocardiogram has been performed.  Darlina Sicilian M 06/29/2014, 3:07 PM

## 2014-07-04 ENCOUNTER — Ambulatory Visit (INDEPENDENT_AMBULATORY_CARE_PROVIDER_SITE_OTHER): Payer: Medicare Other | Admitting: Cardiothoracic Surgery

## 2014-07-04 ENCOUNTER — Encounter: Payer: Self-pay | Admitting: Cardiothoracic Surgery

## 2014-07-04 VITALS — BP 116/69 | HR 89 | Resp 16 | Ht 72.0 in | Wt 152.0 lb

## 2014-07-04 DIAGNOSIS — I7100 Dissection of unspecified site of aorta: Secondary | ICD-10-CM

## 2014-07-04 NOTE — Progress Notes (Signed)
[address]       [place] [address]             [phone]                    [name] [facility] [employer] Medical Record [id number] Date of Birth: 08-Jul-1970  Referring: Elam Dutch, MD Primary Care: [name], MD  Chief Complaint:    Chief Complaint  Patient presents with  . Follow-up    to discuss ECHO, HEART CATH, and CT'S...having bleeding from hemorrhoids    History of Present Illness:    [name] 44 y.o. 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 Pt has h history of several years of poor blood pressure control presented to ER sudden back pain that then radiated into chest and left flank. No paresthesias in lower extremities or syncope. No pain in feet or legs no vomiting or bloody diarrhea or abdominal pain. Pt has had multiple prior admissions with BP in the 200s over the last several years. He developed renal failure which has improved. Now being evaluated for stent grafting of type 3 aortic dissection. He notes some low back pain and occsional sore stomach since d/c. He has been taking bp meds.       Current Activity/ Functional Status:  Patient is independent with mobility/ambulation, transfers, ADL's, IADL's.   Zubrod Score: At the time of surgery this patient's most appropriate activity status/level should be described as: []     0    Normal activity, no symptoms [x]     1    Restricted in physical strenuous activity but ambulatory, able to do out light work []     2    Ambulatory and capable of self care, unable to do work activities, up and about               >50 % of waking hours                              []     3    Only limited self care, in bed greater than 50% of waking hours []     4    Completely disabled, no self care, confined to bed or chair []     5    Moribund   Past  Medical History  Diagnosis Date  . Hypertension     a. Hx of HTN urgency secondary to noncompliance. b. urinary metanephrine and catecholeamine levels normal 2013.  [name] Anxiety   . GERD (gastroesophageal reflux disease)   . Acute renal failure     June 2012 felt secondary to Toradol  . Chronic renal failure   . Chronic combined systolic and diastolic congestive heart failure     a. 05/2011: Adm with pulm edema/HTN urgency, EF 35-40% with diffuse hypokinesis and moderate to severe mitral regurgitation. Cardiomyopathy likely due to uncontrolled HTN and ETOH abuse - cath deferred due to renal insufficiency (felt due to uncontrolled HTN). bJodie Echevaria MV 06/2011: EF 37% and no ischemia or infarction. c. EF 45-50% by echo 01/2012.  . Cardiomyopathy     non-ischemic - probably related to untreated HTN and ETOH abuse  . HYPERLIPIDEMIA   . INGUINAL HERNIA   . ETOH abuse  a. Reported to have quit 05/2011.  . Valvular heart disease     a. Echo 05/2011: moderate to severe eccentric MR and mild to moderate AI with prolapsing left coronary cusp. b. Echo 01/2012: mild-mod AI, mild dilitation of aortic root, mild MR.;  b. 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 CKD (chronic kidney disease)     a. 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  peak creatinine 3.46 during admx for aortic dissection 2/16  . Tobacco abuse   . Pneumonia ~ 2013  . Headache(784.0)     "q other day" (08/08/2013)  . Chronic sinusitis   . History of medication noncompliance   . Chronic abdominal pain   . DDD (degenerative disc disease), lumbar   . Aortic dissection     a. admx 04/2014 >> L renal infarct; a/c renal failure >> VVS to re-eval 05/2014 to decide +/- surgery    Past Surgical History  Procedure Laterality Date  . Ankle surgery      Fractures bilaterally  . Inguinal hernia repair Right ~ 1996  . Foot fracture surgery Bilateral  2004-2010    "got pins in both of them"  . Left heart catheterization with coronary angiogram N/A 06/21/2014    Procedure: LEFT HEART CATHETERIZATION WITH CORONARY ANGIOGRAM;  Surgeon: Larey Dresser, MD;  Location: [facility] CATH LAB;  Service: Cardiovascular;  Laterality: N/A;    Family History  Problem Relation Age of Onset  . Hypertension    . Colon cancer Paternal Uncle   . Stroke Maternal Aunt   . Heart attack Brother   . Diabetes Maternal Aunt   . Throat cancer Neg Hx   . Pancreatic cancer Neg Hx   . Esophageal cancer Neg Hx   . Kidney disease Neg Hx   . Liver disease Neg Hx     History   Social History  . Marital Status: Married    Spouse Name: N/A  . Number of Children: 5  . Years of Education: N/A   Occupational History  . Disabled, part-time Programmer, systems     Disability   Social History Main Topics  . Smoking status: Current Every Day Smoker -- 0.25 packs/day for 23 years    Types: Cigarettes    Last Attempt to Quit: 05/03/2014  . Smokeless tobacco: Former Systems developer    Types: Chew     Comment: as of 05-22-14 down to 3-4 cigs daily  . Alcohol Use: No     Comment: + previous use  . Drug Use: Yes    Special: Marijuana     Comment: 08/08/2013 "twice a month"  . Sexual Activity: Yes   Other Topics Concern  . Not on file   Social History Narrative    History  Smoking status  . Current Every Day Smoker -- 0.25 packs/day for 23 years  . Types: Cigarettes  . Last Attempt to Quit: 05/03/2014  Smokeless tobacco  . Former Systems developer  . Types: Chew    Comment: as of 05-22-14 down to 3-4 cigs daily    History  Alcohol Use No    Comment: + previous use     Allergies  Allergen Reactions  . Imdur [Isosorbide] Other (See Comments)    headache    Current Outpatient Prescriptions  Medication Sig Dispense Refill  . atorvastatin (LIPITOR) 20 MG tablet Take 20 mg by mouth daily.    [name]  cloNIDine (CATAPRES) 0.2 MG tablet Take 1 tablet (0.2 mg total) by mouth 3 (three) times  daily. 90 tablet 1  . hydrALAZINE (APRESOLINE) 100 MG tablet Take 1 tablet (100 mg total) by mouth 3 (three) times daily. 90 tablet 1  . labetalol (NORMODYNE) 200 MG tablet Take 2 tablets (400 mg total) by mouth daily. 60 tablet 11  . minoxidil (LONITEN) 2.5 MG tablet Take 1 tablet (2.5 mg total) by mouth 2 (two) times daily. 60 tablet 3  . nitroGLYCERIN (NITRODUR - DOSED IN MG/24 HR) 0.4 mg/hr patch Place 1 patch (0.4 mg total) onto the skin daily. 30 patch 1   No current facility-administered medications for this visit.      Review of Systems:     Cardiac Review of Systems: Y or N  Chest Pain [  n  ]  Resting SOB [ n  ] Exertional SOB  [n  ]  Orthopnea [ n ]   Pedal Edema [ n  ]    Palpitations [n  ] Syncope  [ n ]   Presyncope [ n  ]  General Review of Systems: [Y] = yes [  ]=no Constitional: recent weight change [ n ];  Wt loss over the last 3 months [   ] anorexia [  ]; fatigue [  ]; nausea [  ]; night sweats [  ]; fever [  ]; or chills [  ];          Dental: poor dentition[  ]; Last Dentist visit:   Eye : blurred vision [  ]; diplopia [   ]; vision changes [  ];  Amaurosis fugax[  ]; Resp: cough [  ];  wheezing[  ];  hemoptysis[n  ]; shortness of breath[n  ]; paroxysmal nocturnal dyspnea[  ]; dyspnea on exertion[  ]; or orthopnea[  ];  GI:  gallstones[  ], vomiting[  ];  dysphagia[  ]; melena[  ];  hematochezia [  ]; heartburn[  ];   Hx of  Colonoscopy[  ]; GU: kidney stones [  ]; hematuria[  ];   dysuria [  ];  nocturia[  ];  history of     obstruction [  ]; urinary frequency [n  ]             Skin: rash, swelling[  ];, hair loss[  ];  peripheral edema[  ];  or itching[  ]; Musculosketetal: myalgias[  ];  joint swelling[  ];  joint erythema[  ];  joint pain[  ];  back pain[  ];  Heme/Lymph: bruising[  ];  bleeding[  ];  anemia[  ];  Neuro: TIA[  ];  headaches[  ];  stroke[  ];  vertigo[  ];  seizures[  ];   paresthesias[  ];  difficulty walking[  ];  Psych:depression[  ]; anxiety[   ];  Endocrine: diabetes[  ];  thyroid dysfunction[  ];  Immunizations: Flu up to date [  ]; Pneumococcal up to date [  ];  Other:  Physical Exam: BP 116/69 mmHg  Pulse 89  Resp 16  Ht 6' (1.829 m)  Wt 152 lb (68.947 kg)  BMI 20.61 kg/m2  SpO2 98%  PHYSICAL EXAMINATION: General appearance: alert, cooperative and appears older than stated age Head: Normocephalic, without obvious abnormality, atraumatic Neck: no adenopathy, no carotid bruit, no JVD, supple, symmetrical, trachea midline and thyroid not enlarged, symmetric, no tenderness/mass/nodules Lymph nodes: Cervical, supraclavicular, and axillary nodes normal. Resp: clear to  auscultation bilaterally Back: symmetric, no curvature. ROM normal. No CVA tenderness. Cardio: diastolic murmur: holodiastolic 3/6, crescendo throughout the precordium GI: soft, non-tender; bowel sounds normal; no masses,  no organomegaly Extremities: extremities normal, atraumatic, no cyanosis or edema and Homans sign is negative, no sign of DVT Neurologic: Grossly normal Palpable pedal pulses, femoral pulses and brachial pulses equal Diagnostic Studies & Laboratory data:     Recent Radiology Findings:   Ct Angio Chest Aorta W/cm &/or Wo/cm  06/29/2014   CLINICAL DATA:  History of thoracic aortic and abdominal aortic 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 History of smoking.  EXAM: CT ANGIOGRAPHY CHEST, ABDOMEN AND PELVIS  TECHNIQUE: Multidetector CT imaging through the chest, abdomen and pelvis was performed using the standard protocol during bolus administration of intravenous contrast. Multiplanar reconstructed images and MIPs were obtained and reviewed to evaluate the vascular anatomy.  CONTRAST:  75 cc Isovue 370  COMPARISON:  Chest CT- 04/23/2014; 03/27/2014 ; 09/19/2012  FINDINGS: Vascular Findings of the chest:  Similar findings of a type B thoracic aortic dissection with origin  regional to the takeoff of the left subclavian artery (representative axial image 45, series 4, coronal images 73 and 84, series 602). No definite dissection of the ascending thoracic aorta given pulsation artifact.  Both the true and false lumens of the dissection remain patent to the level of the abdominal aorta, though there has been interval enlargement of the false lumen in relation to the true lumen. No definite periaortic stranding.  There is unchanged aneurysmal dilatation of the ascending thoracic aorta though there has been interval enlargement of aneurysmal dilatation of the aortic arch, currently measuring 51 mm in maximal diameter, previously, 42 mm.  Conventional configuration of the aortic arch. There is no extension of the thoracic aortic dissection to involve any of the branch vessels of the aortic arch. The branch vessels of the aortic arch remain widely patent throughout their imaged course. Non-opacified blood is seen within the central aspect of the left internal jugular vein, less likely a nonocclusive DVT.  Cardiomegaly.  No pericardial effusion.  Although this examination was not tailored for the evaluation of the pulmonary arteries, there are no discrete filling defects within the central pulmonary arterial tree to suggest central pulmonary embolism. Borderline enlarged caliber the main pulmonary artery measuring approximately 36 mm in diameter (image 61, series 4).  -------------------------------------------------------------  Thoracic aortic measurements:  Aortic root:  49 mm in greatest oblique coronal dimension diameter (image 63, series 601), grossly unchanged.  Sinotubular junction  43 mm as measured in greatest oblique coronal dimension (image 61, series 601), grossly unchanged.  Proximal ascending aorta  44 mm as measured in greatest oblique axial dimension at the level of the main pulmonary artery (image 61, series 4) an approximately 44 mm in greatest oblique coronal dimension  (coronal image 57, series 601), grossly unchanged.  Aortic arch aorta  48 mm as measured in greatest oblique sagittal dimension (sagittal image 89, series 602), previously, 41 mm, and approximately 51 mm in greatest oblique axial diameter (image 38, series 4), previously, 42 mm  Proximal descending thoracic aorta  36 mm as measured in greatest oblique axial dimension at the level of the main pulmonary artery (image 61, series 4), minimally increased in size in the interval, previously, 34 mm  Distal descending thoracic aorta  33 mm as measured in greatest oblique axial dimension at the level of the diaphragmatic hiatus (  image 122, series 4) an approximately 34 mm in greatest oblique sagittal diameter (image 90, series 602, previously, 32 mm.  Review of the MIP images confirms the above findings.  -------------------------------------------------------------  Non-Vascular Findings of the chest:  There is minimal dependent subpleural ground-glass atelectasis. No discrete focal airspace opacities. No pleural effusion or pneumothorax. The central pulmonary airways appear widely patent.  No discrete pulmonary nodules.  Ill-defined soft tissue within the anterior mediastinum appears slightly more confluent on the present examination with dominant component now measuring approximately 5.8 x 1.3 cm in maximal diameter (image 51, series 4), previously, dominant conglomeration measured approximately 4.1 x 0.8 cm, though note, exact measurements are difficult secondary to ill-defined borders of this soft tissue. Scattered shotty mediastinal lymph nodes individually not enlarged by size criteria with index pretracheal lymph node measuring 0.8 cm in greatest short axis diameter (image 45, series 4). No mediastinal, hilar or axillary lymphadenopathy.  No acute or aggressive osseous abnormalities within the chest.  Regional soft tissues appear normal. Normal appearance of the thyroid gland.   ---------------------------------------------------------------  Vascular Findings of the abdomen and pelvis:  Abdominal aorta: There is persistent extension of the type B thoracic aortic dissection through the proximal aspect of the abdominal aorta. Previously, the dissection was noted to terminate at the mid aspect of the infrarenal abdominal aorta however presently the dissection extends at the origin of the left renal artery with there has been development of an eccentric saccular outpouching about the left renal artery ostia (representative coronal image 69, series 601)  There has been interval enlargement of the supra and perirenal abdominal aorta with the perirenal abdominal aorta now measuring approximately 31 mm in greatest oblique coronal diameter (image 69, series 601), previously, 2.7 cm and approximately 3.2 cm in greatest oblique short axis axial diameter (image 139, series 4, previously, 2.4 cm.  There has been interval development of a moderate amount of eccentric mural thrombus within the distal aspect of the false lumen (image 140, series 4). No definitive periaortic stranding.  There is a minimal amount of eccentric mixed calcified and noncalcified atherosclerotic plaque within the distal aspect of the abdominal aorta which remains tortuous but of normal caliber.  Celiac artery: Supplied by the false lumen. Widely patent without hemodynamically significant narrowing. Conventional branching pattern.  SMA: The dissection abuts the origin of the SMA and mildly narrows the vessel at this location (representative axial image 134, series 4, sagittal image 80, series 602) though does not definitely result in a hemodynamically significant stenosis. There is no extension of the dissection into the SMA. Conventional branching pattern. The distal tributaries of the SMA remain patent.  Right Renal artery: Solitary; supplied by the true lumen. Widely patent without a hemodynamically significant narrowing.   Left Renal artery: Solitary; as detailed above, there is extension of the dissection to the level of the left renal artery. The dissection abuts and narrows the origin of the left renal artery (axial image 144, series 4, coronal image 68, series 601). There is no definitive extension of the dissection into the left renal artery.  IMA: Remains patent throughout its imaged course.  Pelvic vasculature: There is a moderate amount of eccentric mixed calcified and noncalcified atherosclerotic plaque scatter within in the bilateral common and external iliac arteries, not definitely resulting in hemodynamically significant stenosis. There is mild ectasia of the left common iliac artery measuring approximately 1.7 cm in maximal diameter (image 182, series 4). There is unchanged ectasia involving the proximal aspect the left  internal iliac artery measuring approximately 1 cm in diameter (image 201, series 4).  Review of the MIP images confirms the above findings.   --------------------------------------------------------------------------------  Nonvascular Findings of the abdomen and pelvis:  Evaluation of the abdominal organs is largely limited to the arterial phase of enhancement.  Normal hepatic contour. No discrete hyper enhancing hepatic lesions. Normal appearance of the gallbladder given degree distention. No radiopaque gallstones. No intra extrahepatic biliary duct dilatation. No ascites.  There is apparent delayed enhancement involving the superior pole of the left kidney (image 89, series 601) though overall, the enhancement of the left kidney is markedly improved since the 04/2014 examination. No definitive asymmetric left-sided renal atrophy. No definite renal stones in this postcontrast examination. No urinary obstruction or perinephric stranding.  Normal appearance of the bilateral adrenal glands, pancreas and spleen.  Moderate colonic stool burden without evidence of enteric obstruction. Normal appearance of  the retrocecal appendix. No pneumoperitoneum, pneumatosis or portal venous gas.  Scattered shotty retroperitoneal lymph nodes are individually not enlarged by size criteria with index left-sided periaortic lymph node measuring approximately 0.7 cm in greatest short axis diameter (image 151, series 4) though note, evaluation is degraded secondary to the arterial phase of enhancement as well as lack of significant mesenteric fat. No definitive retroperitoneal, mesenteric, pelvic or inguinal lymphadenopathy.  Dystrophic calcification within normal sized prostate gland. Normal appearance of the urinary bladder given degree distention. A small amount of free fluid is noted within the pelvic cul-de-sac.  No acute or aggressive osseous abnormalities within the abdomen or pelvis.  Regional soft tissues appear normal.  IMPRESSION: Vascular Impression of the chest:  1. Chronic type B thoracic aortic dissection extending throughout the thoracic aorta. There is no extension of the thoracic aortic dissection to involve the great vessels of the aortic arch. 2. Increased size of the false lumen of the dissection with corresponding increased aneurysmal dilatation of the aortic arch and descending thoracic aorta. The aortic arch has most significantly increased in size in the interval, currently measuring 51 mm in greatest diameter, previously, 41 mm. No definite periaortic stranding. 3. Unchanged fusiform aneurysmal dilatation of the ascending thoracic aorta measuring 44 mm in diameter. No definite dissection involving the ascending thoracic aorta on this nongated examination. 4. Cardiomegaly with enlargement of the caliber the main pulmonary artery, nonspecific though could be seen in the setting of pulmonary arterial hypertension. Further evaluation cardiac echo could be performed as clinically indicated. Nonvascular Impression of the chest:  1. Increased conspicuity and confluence of ill-defined soft tissue within the anterior  mediastinum, currently measuring approximately 5.8 x 1.3 cm, previously, 4.1 x 0.8 cm - the while potentially reactive adenopathy, additional differential considerations include thymoma and lymphoma. Continued attention on follow-up is recommended. -------------------------------------------------------------  Vascular Impression of the abdomen and pelvis:  1. Persistent extension of the type B thoracic aortic dissection now terminating at the takeoff of the left renal artery with development of an irregular saccular outpouching about the left renal arteries ostia. Interval development of a moderate amount of eccentric irregular thrombus within the distal most aspect of the false lumen. No definite periaortic stranding. 2. Increased aneurysmal dilatation of the supra and perirenal abdominal aorta now measuring 3.2 cm in diameter, previously, 2.4 cm 3. There is extension of the dissection to about the origin of the left renal artery resulting in an approximately 50% luminal narrowing though, there is no definitive extension of the dissection into the left renal artery. 4. There is extension of the  dissection to abut and mildly narrow the origin of the SMA, not definitely resulting in hemodynamically significant stenosis. There is no extension of the dissection into the SMA. Nonvascular Impression of the abdomen and pelvis:  1. Mildly delayed enhancement involving the superior pole of the left kidney, markedly improved since the 04/2014 examination and without evidence of asymmetric left-sided renal atrophy. These results will be called to the ordering clinician or representative by the Radiologist Assistant, and communication documented in the PACS or zVision Dashboard.   Electronically Signed   By: Sandi Mariscal M.D.   On: 06/29/2014 18:02   I have independently reviewed the above radiologic studies.  ECHO 06/2014: Study Conclusions  - Left ventricle: The cavity size was mildly dilated. Wall thickness was  increased in a pattern of severe LVH. Systolic function was normal. The estimated ejection fraction was in the range of 55% to 60%. - Aortic valve: There was moderate regurgitation. - Mitral valve: There was moderate regurgitation. - Left atrium: The atrium was mildly dilated. - Atrial septum: No defect or patent foramen ovale was identified. - Pericardium, extracardiac: A trivial pericardial effusion was identified. - Impressions: Known type B disection. Communication between true and false lumens. Suprasternal images suggest that the disection plane may propagate to at least left subclavian take off. Root above aortic valve ok. He apparently is seeing Dr Servando Snare at this time and dissection is known Suggest f/u MRI or CTA to r/o propagation through arch.  Impressions:  - Known type B disection. Communication between true and false lumens. Suprasternal images suggest that the disection plane may propagate to at least left subclavian take off. Root above aortic valve ok. He apparently is seeing Dr Servando Snare at this time and dissection is known Suggest f/u MRI or CTA to r/o propagation through arch.  CARDIAC CATH: Procedural Findings: Hemodynamics: AO 147/83 LV 148/8  Coronary angiography: Coronary dominance: right  Left mainstem: No significant disease.   Left anterior descending (LAD): Large early D1 with 60% ostial stenosis. Luminal irregularities in the remainder of the LAD.   Left circumflex (LCx): Luminal irregularities.   Right coronary artery (RCA): Luminal irregularities  Left ventriculography: Not done (CKD)  Final Conclusions: Moderate disease in a large D1 (60%). Otherwise, no significant disease. As D1 stenosis not flow limiting, probably would not bypass with surgery. However, I will ask him to start ASA 81 daily and atorvastatin 20 daily. Will need lipids/LFTs in 2 months. Given CKD, will keep for 5 hours to hydrate at 125  cc/hr.   [name] MD, [facility] 06/21/2014, 2:20 PM  Recent Lab Findings: Lab Results  Component Value Date   WBC 3.9* 06/15/2014   HGB 10.1* 06/15/2014   HCT 30.1* 06/15/2014   PLT 215.0 06/15/2014   GLUCOSE 99 06/15/2014   CHOL 182 03/27/2014   TRIG 49 03/27/2014   HDL 57 03/27/2014   LDLCALC 115* 03/27/2014   ALT 20 04/26/2014   AST 22 04/26/2014   NA 138 06/15/2014   K 3.8 06/15/2014   CL 111 06/15/2014   CREATININE 1.49 06/15/2014   BUN 10 06/15/2014   CO2 23 06/15/2014   TSH 0.724 04/25/2014   INR 1.1* 06/15/2014   HGBA1C 5.3 03/27/2014   Chronic Kidney Disease   Stage I     GFR >90  Stage II    GFR 60-89  Stage IIIA GFR 45-59  Stage IIIB GFR 30-44  Stage IV   GFR 15-29  Stage V    GFR  <  15  Lab Results  Component Value Date   CREATININE 1.49 06/15/2014  GFR= 40   Assessment / Plan:   1/ Chronic type B thoracic aortic dissection extending throughout the thoracic aorta.  2/ Increased size of the false lumen of the dissection with corresponding increased aneurysmal dilatation of the aortic arch and descending thoracic aorta. currently measuring 51 mm in greatest diameter, previously 41 mm. 3/ Stable  fusiform aneurysmal dilatation of the ascending thoracic aorta measuring 44 mm in diameter. No definite dissection involving the ascending thoracic aorta  4/ Chronic kidney disease Stage III 5/ Aortic valve: There was moderate regurgitation.     Mitral valve: There was moderate regurgitation. 6/ no significant coronary artery disease by recent cardiac cath  Patient with complex dissection with increased size of aortic arch and proximal descending aorta. Will have patient seen by Dr Mali Hughes for opinion  on treatment with arch replacement vs  Stent ( limited land zone) vs observation.     Grace Isaac MD      [address] [place] [address] Office [phone]   [place]

## 2014-07-05 ENCOUNTER — Encounter: Payer: Self-pay | Admitting: Cardiothoracic Surgery

## 2014-07-12 ENCOUNTER — Emergency Department (HOSPITAL_COMMUNITY): Payer: Medicare Other

## 2014-07-12 ENCOUNTER — Emergency Department (HOSPITAL_COMMUNITY)
Admission: EM | Admit: 2014-07-12 | Discharge: 2014-07-12 | Disposition: A | Discharge status: Home / Self Care | Payer: Medicare Other | Attending: Emergency Medicine | Admitting: Emergency Medicine

## 2014-07-12 ENCOUNTER — Encounter (HOSPITAL_COMMUNITY): Payer: Self-pay | Admitting: Emergency Medicine

## 2014-07-12 ENCOUNTER — Telehealth: Payer: Self-pay

## 2014-07-12 DIAGNOSIS — F17210 Nicotine dependence, cigarettes, uncomplicated: Secondary | ICD-10-CM | POA: Diagnosis not present

## 2014-07-12 DIAGNOSIS — Z8719 Personal history of other diseases of the digestive system: Secondary | ICD-10-CM | POA: Diagnosis not present

## 2014-07-12 DIAGNOSIS — Z9889 Other specified postprocedural states: Secondary | ICD-10-CM | POA: Insufficient documentation

## 2014-07-12 DIAGNOSIS — I7101 Dissection of thoracic aorta: Secondary | ICD-10-CM | POA: Diagnosis not present

## 2014-07-12 DIAGNOSIS — F419 Anxiety disorder, unspecified: Secondary | ICD-10-CM | POA: Insufficient documentation

## 2014-07-12 DIAGNOSIS — Z8709 Personal history of other diseases of the respiratory system: Secondary | ICD-10-CM | POA: Diagnosis not present

## 2014-07-12 DIAGNOSIS — G8929 Other chronic pain: Secondary | ICD-10-CM | POA: Diagnosis not present

## 2014-07-12 DIAGNOSIS — Z9119 Patient's noncompliance with other medical treatment and regimen: Secondary | ICD-10-CM | POA: Diagnosis not present

## 2014-07-12 DIAGNOSIS — I129 Hypertensive chronic kidney disease with stage 1 through stage 4 chronic kidney disease, or unspecified chronic kidney disease: Secondary | ICD-10-CM | POA: Insufficient documentation

## 2014-07-12 DIAGNOSIS — Z79899 Other long term (current) drug therapy: Secondary | ICD-10-CM | POA: Insufficient documentation

## 2014-07-12 DIAGNOSIS — E785 Hyperlipidemia, unspecified: Secondary | ICD-10-CM | POA: Diagnosis not present

## 2014-07-12 DIAGNOSIS — Z8701 Personal history of pneumonia (recurrent): Secondary | ICD-10-CM | POA: Insufficient documentation

## 2014-07-12 DIAGNOSIS — Z8739 Personal history of other diseases of the musculoskeletal system and connective tissue: Secondary | ICD-10-CM | POA: Insufficient documentation

## 2014-07-12 DIAGNOSIS — I5042 Chronic combined systolic (congestive) and diastolic (congestive) heart failure: Secondary | ICD-10-CM | POA: Diagnosis not present

## 2014-07-12 DIAGNOSIS — R0789 Other chest pain: Secondary | ICD-10-CM | POA: Diagnosis not present

## 2014-07-12 DIAGNOSIS — R079 Chest pain, unspecified: Secondary | ICD-10-CM | POA: Diagnosis not present

## 2014-07-12 DIAGNOSIS — N189 Chronic kidney disease, unspecified: Secondary | ICD-10-CM | POA: Diagnosis not present

## 2014-07-12 DIAGNOSIS — I71019 Dissection of thoracic aorta, unspecified: Secondary | ICD-10-CM

## 2014-07-12 DIAGNOSIS — I517 Cardiomegaly: Secondary | ICD-10-CM | POA: Diagnosis not present

## 2014-07-12 LAB — I-STAT TROPONIN, ED
TROPONIN I, POC: 0.01 ng/mL (ref 0.00–0.08)
Troponin i, poc: 0.01 ng/mL (ref 0.00–0.08)

## 2014-07-12 LAB — CBC WITH DIFFERENTIAL/PLATELET
Basophils Absolute: 0 10*3/uL (ref 0.0–0.1)
Basophils Relative: 1 % (ref 0–1)
Eosinophils Absolute: 0.1 10*3/uL (ref 0.0–0.7)
Eosinophils Relative: 3 % (ref 0–5)
HEMATOCRIT: 29.4 % — AB (ref 39.0–52.0)
HEMOGLOBIN: 9.8 g/dL — AB (ref 13.0–17.0)
LYMPHS ABS: 1.3 10*3/uL (ref 0.7–4.0)
Lymphocytes Relative: 31 % (ref 12–46)
MCH: 29.4 pg (ref 26.0–34.0)
MCHC: 33.3 g/dL (ref 30.0–36.0)
MCV: 88.3 fL (ref 78.0–100.0)
MONOS PCT: 12 % (ref 3–12)
Monocytes Absolute: 0.5 10*3/uL (ref 0.1–1.0)
NEUTROS ABS: 2.3 10*3/uL (ref 1.7–7.7)
Neutrophils Relative %: 53 % (ref 43–77)
Platelets: 172 10*3/uL (ref 150–400)
RBC: 3.33 MIL/uL — ABNORMAL LOW (ref 4.22–5.81)
RDW: 16.1 % — ABNORMAL HIGH (ref 11.5–15.5)
WBC: 4.3 10*3/uL (ref 4.0–10.5)

## 2014-07-12 LAB — BASIC METABOLIC PANEL
Anion gap: 9 (ref 5–15)
BUN: 16 mg/dL (ref 6–23)
CALCIUM: 9.2 mg/dL (ref 8.4–10.5)
CO2: 21 mmol/L (ref 19–32)
Chloride: 107 mmol/L (ref 96–112)
Creatinine, Ser: 1.87 mg/dL — ABNORMAL HIGH (ref 0.50–1.35)
GFR calc Af Amer: 50 mL/min — ABNORMAL LOW (ref >90)
GFR, EST NON AFRICAN AMERICAN: 43 mL/min — AB (ref >90)
Glucose, Bld: 97 mg/dL (ref 70–99)
Potassium: 4.1 mmol/L (ref 3.5–5.1)
Sodium: 137 mmol/L (ref 135–145)

## 2014-07-12 MED ORDER — NITROGLYCERIN 0.4 MG SL SUBL
0.4000 mg | SUBLINGUAL_TABLET | SUBLINGUAL | Status: DC | PRN
  Administered 2014-07-12: 0.4 mg via SUBLINGUAL
  Filled 2014-07-12: qty 1

## 2014-07-12 MED ORDER — FENTANYL CITRATE (PF) 100 MCG/2ML IJ SOLN
50.0000 ug | Freq: Once | INTRAMUSCULAR | Status: AC
  Administered 2014-07-12: 50 ug via INTRAVENOUS
  Filled 2014-07-12: qty 2

## 2014-07-12 MED ORDER — SODIUM CHLORIDE 0.9 % IV BOLUS (SEPSIS)
1000.0000 mL | Freq: Once | INTRAVENOUS | Status: AC
  Administered 2014-07-12: 1000 mL via INTRAVENOUS

## 2014-07-12 MED ORDER — ASPIRIN 81 MG PO CHEW
324.0000 mg | CHEWABLE_TABLET | Freq: Once | ORAL | Status: AC
  Administered 2014-07-12: 324 mg via ORAL
  Filled 2014-07-12: qty 4

## 2014-07-12 MED ORDER — HYDROCODONE-ACETAMINOPHEN 5-325 MG PO TABS
1.0000 | ORAL_TABLET | Freq: Once | ORAL | Status: AC
  Administered 2014-07-12: 1 via ORAL
  Filled 2014-07-12: qty 1

## 2014-07-12 MED ORDER — OXYCODONE HCL 5 MG PO TABS: 5.0000 mg | ORAL_TABLET | ORAL | Status: DC | PRN

## 2014-07-12 NOTE — ED Provider Notes (Signed)
ED ECG REPORT  I personally interpreted this EKG   Date: 07/14/2014   Rate: 85  Rhythm: normal sinus rhythm  QRS Axis: normal  Intervals: normal  ST/T Wave abnormalities: TWI in the inferior and lateral precordial leads  Conduction Disutrbances:none  Narrative Interpretation:   Old EKG Reviewed: unchanged   I saw and evaluated the patient, reviewed the resident's note and I agree with the findings and plan.  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 Abdomen is nontender, mucous members are moist, EKG shows no significant findings.  The patient is obviously at increased risk for worsening aortic pathology, discussed with radiology by the resident, will perform noncontrast CT scan initially, this isn't to be made based on those findings initially, prior echocardiogram suggest aortic regurgitation consistent with patient's murmur. He is not hypotensive, does not appear to be in distress  I personally interpreted the EKG as well as the resident and agree with the interpretation on the resident's chart.  Final diagnoses:  Chronic thoracic aortic dissection  Left sided chest pain       Noemi Chapel, MD 07/14/14 1041

## 2014-07-12 NOTE — ED Provider Notes (Signed)
CSN: [id number]     Arrival date & time 07/12/14  1419 History   First MD Initiated Contact with Patient 07/12/14 1747     Chief Complaint  Patient presents with  . Chest Pain   (Consider location/radiation/quality/duration/timing/severity/associated sxs/prior Treatment) Patient is a 44 y.o. male presenting with chest pain. The history is provided by the patient and medical records. No language interpreter was used.  Chest Pain Pain location:  L chest Pain quality: aching   Pain radiates to:  Upper back and mid back Pain radiates to the back: yes   Pain severity:  Moderate Onset quality:  Gradual Timing:  Constant Progression:  Waxing and waning Chronicity:  Recurrent Context: at rest   Relieved by:  Nothing Worsened by:  Nothing tried Ineffective treatments: tylenol' Associated symptoms: back pain   Associated symptoms: no abdominal pain, no altered mental status, no anorexia, no anxiety, no claudication, no cough, no dizziness, no fatigue, no fever, no headache, no lower extremity edema, no nausea, no near-syncope, no palpitations, no shortness of breath, no syncope, not vomiting and no weakness   Risk factors: aortic disease, high cholesterol, hypertension and smoking   Risk factors: no coronary artery disease, no diabetes mellitus, no immobilization, not male and no prior DVT/PE     Past Medical History  Diagnosis Date  . Hypertension     a. Hx of HTN urgency secondary to noncompliance. b. urinary metanephrine and catecholeamine levels normal 2013.  [name] Anxiety   . GERD (gastroesophageal reflux disease)   . Acute renal failure     June 2012 felt secondary to Toradol  . Chronic renal failure   . Chronic combined systolic and diastolic congestive heart failure     a. 05/2011: Adm with pulm edema/HTN urgency, EF 35-40% with diffuse hypokinesis and moderate to severe mitral regurgitation. Cardiomyopathy likely due to uncontrolled HTN and ETOH abuse - cath deferred due to renal  insufficiency (felt due to uncontrolled HTN). bJodie Echevaria MV 06/2011: EF 37% and no ischemia or infarction. c. EF 45-50% by echo 01/2012.  . Cardiomyopathy     non-ischemic - probably related to untreated HTN and ETOH abuse  . HYPERLIPIDEMIA   . INGUINAL HERNIA   . ETOH abuse     a. Reported to have quit 05/2011.  . Valvular heart disease     a. Echo 05/2011: moderate to severe eccentric MR and mild to moderate AI with prolapsing left coronary cusp. b. Echo 01/2012: mild-mod AI, mild dilitation of aortic root, mild MR.;  b. 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 CKD (chronic kidney disease)     a. 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  peak creatinine 3.46 during admx for aortic dissection 2/16  . Tobacco abuse   . Pneumonia ~ 2013  . Headache(784.0)     "q other day" (08/08/2013)  . Chronic sinusitis   . History of medication noncompliance   . Chronic abdominal pain   . DDD (degenerative disc disease), lumbar   . Aortic dissection     a. admx 04/2014 >> L renal infarct; a/c renal failure >> VVS to re-eval 05/2014 to decide +/- surgery   Past Surgical History  Procedure Laterality Date  . Ankle surgery      Fractures bilaterally  . Inguinal hernia repair Right ~ 1996  . Foot fracture surgery Bilateral 2004-2010    "got pins in  both of them"  . Left heart catheterization with coronary angiogram N/A 06/21/2014    Procedure: LEFT HEART CATHETERIZATION WITH CORONARY ANGIOGRAM;  Surgeon: Larey Dresser, MD;  Location: [facility] CATH LAB;  Service: Cardiovascular;  Laterality: N/A;   Family History  Problem Relation Age of Onset  . Hypertension    . Colon cancer Paternal Uncle   . Stroke Maternal Aunt   . Heart attack Brother   . Diabetes Maternal Aunt   . Throat cancer Neg Hx   . Pancreatic cancer Neg Hx   . Esophageal cancer Neg Hx   . Kidney disease Neg Hx   . Liver disease Neg Hx    History   Substance Use Topics  . Smoking status: Current Every Day Smoker -- 0.25 packs/day for 23 years    Types: Cigarettes    Last Attempt to Quit: 05/03/2014  . Smokeless tobacco: Former Systems developer    Types: Chew     Comment: as of 05-22-14 down to 3-4 cigs daily  . Alcohol Use: No     Comment: + previous use    Review of Systems  Constitutional: Negative for fever and fatigue.  Respiratory: Negative for cough, chest tightness and shortness of breath.   Cardiovascular: Positive for chest pain. Negative for palpitations, claudication, syncope and near-syncope.  Gastrointestinal: Negative for nausea, vomiting, abdominal pain and anorexia.  Genitourinary: Negative for flank pain.  Musculoskeletal: Positive for back pain.  Neurological: Negative for dizziness, weakness, light-headedness and headaches.  Psychiatric/Behavioral: Negative for confusion.  All other systems reviewed and are negative.     Allergies  Imdur  Home Medications   Prior to Admission medications   Medication Sig Start Date End Date Taking? Authorizing Provider  atorvastatin (LIPITOR) 20 MG tablet Take 20 mg by mouth daily.    Historical Provider, MD  cloNIDine (CATAPRES) 0.2 MG tablet Take 1 tablet (0.2 mg total) by mouth 3 (three) times daily. 05/03/14   Orson Eva, MD  hydrALAZINE (APRESOLINE) 100 MG tablet Take 1 tablet (100 mg total) by mouth 3 (three) times daily. 05/03/14   Orson Eva, MD  labetalol (NORMODYNE) 200 MG tablet Take 2 tablets (400 mg total) by mouth daily. 06/15/14   Liliane Shi, PA-C  minoxidil (LONITEN) 2.5 MG tablet Take 1 tablet (2.5 mg total) by mouth 2 (two) times daily. 06/15/14   Liliane Shi, PA-C  nitroGLYCERIN (NITRODUR - DOSED IN MG/24 HR) 0.4 mg/hr patch Place 1 patch (0.4 mg total) onto the skin daily. 05/03/14   Orson Eva, MD    BP 135/82 mmHg  Pulse 85  Temp(Src) 98.6 F (37 C) (Oral)  Resp 18  SpO2 98%   Physical Exam  Constitutional: He is oriented to person, place, and time.  Vital signs are normal. He appears well-developed and well-nourished. He is cooperative. He does not have a sickly appearance. No distress.  HENT:  Head: Normocephalic and atraumatic.  Nose: Nose normal.  Mouth/Throat: Oropharynx is clear and moist. No oropharyngeal exudate.  Eyes: EOM are normal. Pupils are equal, round, and reactive to light.  Neck: Normal range of motion. Neck supple.  Cardiovascular: Normal rate, regular rhythm, normal heart sounds and intact distal pulses.   Bilaterally symmetric radial and DP pulses.  No reproducible chest tenderness to palpation.    Pulmonary/Chest: Effort normal and breath sounds normal. No respiratory distress. He has no wheezes. He exhibits no tenderness.  Abdominal: Soft. He exhibits no distension and no mass. There is no tenderness. There  is no guarding.  No masses palpable  Musculoskeletal: Normal range of motion. He exhibits no tenderness.  Neurological: He is alert and oriented to person, place, and time. No cranial nerve deficit. Coordination normal.  Skin: Skin is warm and dry. He is not diaphoretic. No pallor.  Psychiatric: He has a normal mood and affect. His behavior is normal. Judgment and thought content normal.  Nursing note and vitals reviewed.   ED Course  Procedures (including critical care time) Labs Review Labs Reviewed  BASIC METABOLIC PANEL - Abnormal; Notable for the following:    Creatinine, Ser 1.87 (*)    GFR calc non Af Amer 43 (*)    GFR calc Af Amer 50 (*)    All other components within normal limits  CBC WITH DIFFERENTIAL/PLATELET - Abnormal; Notable for the following:    RBC 3.33 (*)    Hemoglobin 9.8 (*)    HCT 29.4 (*)    RDW 16.1 (*)    All other components within normal limits  Randolm Idol, ED    Imaging Review Dg Chest 2 View  07/12/2014   CLINICAL DATA:  Left-sided chest pain.  EXAM: CHEST  2 VIEW  COMPARISON:  May 01, 2014.  FINDINGS: Stable cardiomediastinal silhouette. No pneumothorax or  pleural effusion is noted. Both lungs are clear. The visualized skeletal structures are unremarkable.  IMPRESSION: No active cardiopulmonary disease.   Electronically Signed   By: Marijo Conception, M.D.   On: 07/12/2014 15:48   Ct Chest Wo Contrast  07/12/2014   CLINICAL DATA:  44 year old male with left-sided chest pain worse with inspiration and cough. Medical history includes known thoracic dissection, acute on chronic renal failure and cardiomyopathy  EXAM: CT CHEST WITHOUT CONTRAST  TECHNIQUE: Multidetector CT imaging of the chest was performed following the standard protocol without IV contrast.  COMPARISON:  Prior CT scan of the chest 06/29/2014  FINDINGS: Mediastinum: Similar to slightly decreased nonspecific mediastinal adenopathy. The anterior mediastinal nodal conglomerate measures 5.4 x 1.3 cm compared to 5.8 x 1.3 cm previously. No mediastinal hematoma. Unremarkable thoracic esophagus.  Heart/Vascular: Limited evaluation in the absence of intravenous contrast. No significant interval enlargement in the diameter of the transverse aorta the which again measures 5.1 cm in maximal dimension. No change in the contour or size of the ascending thoracic aorta to suggest retrograde propagation of the dissection flap. Ascending thoracic aorta again measures 4.4 cm in diameter. No evidence of intramural hematoma. Proximal descending thoracic aorta is unchanged at 4.3 cm. The more distal descending thoracic aorta remains within normal limits in caliber. Stable cardiomegaly with left heart enlargement.  Lungs/Pleura: The lungs remain clear.  Bones/Soft Tissues: No acute fracture or aggressive appearing lytic or blastic osseous lesion.  Upper Abdomen: Visualized upper abdominal organs are unremarkable.  IMPRESSION: 1. Limited evaluation in the absence of intravenous contrast. 2. No significant interval change in the diameter, or configuration of the thoracic aorta when compared with the prior study from 06/29/2014.  No evidence of new mediastinal hematoma or pleural effusion. 3. Cardiomegaly with left ventricular enlargement. 4. Anterior mediastinal nodal conglomerate is slightly decreased in size today suggesting reactive adenopathy. Signed,  Criselda Peaches, MD  [facility]  [facility]   Electronically Signed   By: Jacqulynn Cadet M.D.   On: 07/12/2014 19:50     EKG Interpretation None      MDM   Final diagnoses:  Chronic thoracic aortic dissection  Left sided chest pain  Pt is a 44 yo M with hx of type B aortic dissection, HTN, CHF, CKD, HLD, hypertrophic cardiomyopathy who presents with chest pain.  He complains of 5 days of left sided chest pressure/aching sensation that radiates to his mid back.  Similar to previous episodes but more severe and unrelenting.  No associated lightheadedness, diaphoresis, SOB, or nausea.   The patient is followed by [facility] for his aortic dissection.  He was worked up extensively for pre-op planning 2 weeks ago.  He had a LHC that showed 60% diagonal ostial stenosis but otherwise without significant disease, overall considered nonobstructed vessels.  CTA chest on 4/15 showed an increasing size of descending thoracic aorta (from 41 to 51 mm greatest diameter) and stable dilation of the ascending thoracic aorta compared to image 2 months earlier.   He is on medical management for now, but has an appointment with [facility] CT surgery to discuss possible aortic arch repair along with dissection graft placement.    Patient was worked up with EKG, CXR, and labs including troponin.  Given ASA 324 and NTG x 1.  After further review in the chart, there is some concern for aortic stenosis on multiple echos and he has a faint murmur consistent with AS, so will hold on any additional NTG.  Given IV fentanyl and his pain improved.   EKG without signs of new ischemia, has several TWIs in the precordial leads but no ST changes.   Several old EKGs were compared.  2 x today appear similar to each other, but he has some variations in the past few visits.  Today's EKG is similar to his EKG on March 1st.   Unfortunately patient has CKD and his GFR was considered too low to do another contrasted study.  Discussed this with radiology and decided to give him IV hydration while obtaining a CT chest noncontrasted study.  If the noncon image is unable to help [name] determine the size of the dissection, etc, then at that time we could re-scan him with contrast.   CT chest noncon returned with a stable appearing aorta compared to 4/15.    2 x negative troponins.   BP in the [id number] systolic range throughout.  No tachycardia.  Still remains well appearing.  Pulses intact bilaterally and symmetric in upper and lower extremities.   2045 Called [facility] transfer service and spoke with the CT surgery attending on call.  They report that the patient has not been seen in their system yet.  He is scheduled to follow up with Dr. Mali Hughes, but has not done so yet.  After discussing the case, they recommend that as long as the imaging looks unchanged and his BP and pain are well controlled, then there does not seem a reason to fast-track his plan for elective surgery in the near future.   Spoke to Dr. Philbert Riser with cardiology at [facility] at 2120.  He believes the patient's continued pain is not consistent with ischemia based on his history of 5 days of continued pain with 2 x negative biomarkers and EKG, especially based on his recent extensive cardiac work up that showed nonocclusive disease.    As his dissection appears unchanged on imaging, he has had 2 x negative troponins, EKGs that are similar to previous readings, and stable BP and pulse rate, I do not believe that patient is in immediate danger and feel that he is appropriate for continued close outpatient management.  This was discussed at length with the  patient.  He was encouraged to take his BP meds  as scheduled and to return to the ED if his pain worsens, if he develops sBP > 180 or under 100 on home checks, or with any other concerns.  He was advised to keep his scheduled colonoscopy this week for pre-op planning, and encouraged to keep his cardiology follow ups.   Discharged in stable condition.   Patient was seen with ED Attending, Dr. Christy Gentles, MD      Tori Milks, MD 07/13/14 [place], MD 07/14/14 1041

## 2014-07-12 NOTE — Discharge Instructions (Signed)
Aortic Dissection Aortic dissection happens when there is a tear in the inner wall of the aorta. The aorta is the largest blood vessel in the body. It carries blood from the heart to the arteries, and those arteries send blood to all parts of the body. When there is a tear, the blood enters inside the aortic wall and creates a new space for the blood. This causes the aorta to split even more. The collection of blood into this new space may lower the amount of blood that reaches the rest of the body. A dissection causes the aortic wall to be weakened and can cause rupture. Death can occur. It is critical to have medical care right away. CAUSES  Aortic dissection is caused by two things:  A small tear that develops in a weak or injured part of the aortic wall.  The tear spreads inside the aortic wall and the aorta becomes "double-barreled." The following increase the risk of aortic dissection:  High blood pressure.  Hardening of the walls of arteries.  Use of cocaine.  Blunt injury to the chest.  Genetic disorders that affect the connective tissue (one example is Marfan syndrome).  Problems (such as infection) that affect either the aorta or the heart valve.  Problems that affect the tissue that holds together different parts of your body (connective tissue). For example, a few uncommon arthritis problems can increase the chance of an aortic dissection. SYMPTOMS   Sudden onset of severe chest pain. The pain may be sharp, stabbing, or ripping in nature.  The pain may then shift to the shoulder, arm, neck, jaw, abdomen, or hips. Other symptoms may include:  Breathing trouble.  Weakness of any part of the body.  Decreased feeling of any part of the body.  Fainting.  Dizziness.  Feeling sick to your stomach (nausea) and vomiting.  Sweating a lot.  Confusion/dazed. DIAGNOSIS  Testing may include the following:  Electrocardiogram.  Chest x-ray.  Imaging study done after  injecting a dye to clearly view the blood vessel (aortic angiography).  Specialized x-ray of the chest is done after injecting a dye (CT scan) or an MRI.  A test where the image of the heart is studied using sound waves (Echocardiogram). TREATMENT  Treatment will vary depending on what part of the aorta has the problem. Your caregiver may admit you into an intensive care unit. Medicines that reduce the blood pressure and strong painkillers may be given right away. In many cases, heart medicines may be given to relieve the symptoms and help blood pressure.  If medicines are used first and they do not help, then surgery may be done to repair or replace the damaged portion of the aorta. In some cases, surgery is needed right away. You may have to stay in the hospital for 7-10 days. If the aortic valve is damaged due to aortic dissection, repair or replacement of the valve may be done. If the arteries of the heart are affected, bypass surgery of the heart may be done. THIS IS AN EMERGENCY. Call your local emergency services (911 in [place] right away or the closest medical facility.  Document Released: 06/09/2007 Document Revised: 12/21/2012 Document Reviewed: 06/09/2007 [facility] Patient Information 2015 [place], [place]. This information is not intended to replace advice given to you by your health care provider. Make sure you discuss any questions you have with your health care provider.

## 2014-07-12 NOTE — ED Notes (Signed)
Pt c/o left sided CP worse with inspiration and cough into shoulder and back

## 2014-07-12 NOTE — Telephone Encounter (Signed)
Called C/O left arm and chest pain x week. The pain is a dull constant ache that is getting worse everyday with no relieve with medication. Patient was instructed to go to [facility] ED for eval.

## 2014-07-12 NOTE — ED Notes (Signed)
Pt c/o L sided chest pain radiating into neck and L arm x 1 week. Pain unrelieved with tylenol; worsens when coughing

## 2014-07-23 ENCOUNTER — Telehealth: Payer: Self-pay | Admitting: *Deleted

## 2014-07-23 ENCOUNTER — Ambulatory Visit (INDEPENDENT_AMBULATORY_CARE_PROVIDER_SITE_OTHER): Payer: Medicare Other | Admitting: Physician Assistant

## 2014-07-23 ENCOUNTER — Other Ambulatory Visit (INDEPENDENT_AMBULATORY_CARE_PROVIDER_SITE_OTHER): Payer: Medicare Other

## 2014-07-23 ENCOUNTER — Encounter: Payer: Self-pay | Admitting: Physician Assistant

## 2014-07-23 VITALS — BP 100/60 | HR 96 | Ht 71.0 in | Wt 148.0 lb

## 2014-07-23 DIAGNOSIS — K644 Residual hemorrhoidal skin tags: Secondary | ICD-10-CM

## 2014-07-23 DIAGNOSIS — K648 Other hemorrhoids: Secondary | ICD-10-CM | POA: Diagnosis not present

## 2014-07-23 DIAGNOSIS — K625 Hemorrhage of anus and rectum: Secondary | ICD-10-CM

## 2014-07-23 LAB — CBC WITH DIFFERENTIAL/PLATELET
Basophils Absolute: 0 10*3/uL (ref 0.0–0.1)
Basophils Relative: 0.7 % (ref 0.0–3.0)
EOS PCT: 2.5 % (ref 0.0–5.0)
Eosinophils Absolute: 0.1 10*3/uL (ref 0.0–0.7)
HCT: 29.9 % — ABNORMAL LOW (ref 39.0–52.0)
HEMOGLOBIN: 10 g/dL — AB (ref 13.0–17.0)
Lymphocytes Relative: 31.2 % (ref 12.0–46.0)
Lymphs Abs: 1.5 10*3/uL (ref 0.7–4.0)
MCHC: 33.5 g/dL (ref 30.0–36.0)
MCV: 87.1 fl (ref 78.0–100.0)
MONOS PCT: 8.4 % (ref 3.0–12.0)
Monocytes Absolute: 0.4 10*3/uL (ref 0.1–1.0)
Neutro Abs: 2.8 10*3/uL (ref 1.4–7.7)
Neutrophils Relative %: 57.2 % (ref 43.0–77.0)
PLATELETS: 217 10*3/uL (ref 150.0–400.0)
RBC: 3.43 Mil/uL — AB (ref 4.22–5.81)
RDW: 16.1 % — ABNORMAL HIGH (ref 11.5–15.5)
WBC: 4.9 10*3/uL (ref 4.0–10.5)

## 2014-07-23 LAB — BASIC METABOLIC PANEL
BUN: 14 mg/dL (ref 6–23)
CHLORIDE: 106 meq/L (ref 96–112)
CO2: 24 mEq/L (ref 19–32)
Calcium: 9.4 mg/dL (ref 8.4–10.5)
Creatinine, Ser: 1.87 mg/dL — ABNORMAL HIGH (ref 0.40–1.50)
GFR: 51.17 mL/min — ABNORMAL LOW (ref >60.00)
Glucose, Bld: 111 mg/dL — ABNORMAL HIGH (ref 70–99)
Potassium: 3.6 mEq/L (ref 3.5–5.1)
Sodium: 137 mEq/L (ref 135–145)

## 2014-07-23 MED ORDER — HYDROCORTISONE ACETATE 25 MG RE SUPP: RECTAL | Status: DC

## 2014-07-23 NOTE — Progress Notes (Signed)
Patient ID: [name], male   DOB: 05/24/1970, 44 y.o.   MRN: [id number]   Subjective:    Patient ID: [name], male    DOB: 04-09-1970, 44 y.o.   MRN: [id number]  HPI [name] is a 44 yo AA male who was last seen on 06/03/2014 with c/o rectal bleeding over the previous month. This started in setting of hospitalization 04/23/2014 with type B aortic dissection,and acute renal infarct. It was felt he may have had ischemic colitis, and also has a large external hemorrhoid. Colonoscopy not felt appropriate with a dissected aneurysm, and he was told to follow up with Gi after aneurysm repaired. He comes back today stating  He is still seeing blood with Bm's has no rectal pain,sometimes drips some blood in commode or sees small clots. He also c/o feeling terrible. He is fatigued, SOB with exertion, and has ongoing chest ,back and abdominal pain. Appetite has been decreased and weight down . No change in abdominal discomfort post prandially. No change in bowel habits  He has been seen by CVTS, and is being referred to [facility] /Dr [name] for repair of aneurysm. His last imaging was done on 06/29/2014 with Ct angio chest/ abd/pelvis which showed a type B thoracic aortic dissection,with interval enlargement of the false lumen, also increased aneurysmal dilation of the aortic arch . Probable reactive adenopathy in chest.The dissection extends to the origin of the left renal artery, the dissection abuts the SMA with mild narrowing, IMA patent.,nonvascular images of abdomen negative.   Review of Systems Pertinent positive and negative review of systems were noted in the above HPI section.  All other review of systems was otherwise negative.  Outpatient Encounter Prescriptions as of 07/23/2014  Medication Sig  . amLODipine (NORVASC) 10 MG tablet Take 1 tablet by mouth daily.  [name] atorvastatin (LIPITOR) 20 MG tablet Take 20 mg by mouth daily.  . cloNIDine (CATAPRES) 0.2 MG tablet Take 1 tablet (0.2 mg total) by mouth 3  (three) times daily.  . hydrALAZINE (APRESOLINE) 100 MG tablet Take 1 tablet (100 mg total) by mouth 3 (three) times daily.  [name] labetalol (NORMODYNE) 200 MG tablet Take 2 tablets (400 mg total) by mouth daily.  . minoxidil (LONITEN) 2.5 MG tablet Take 1 tablet (2.5 mg total) by mouth 2 (two) times daily.  . nitroGLYCERIN (NITRODUR - DOSED IN MG/24 HR) 0.4 mg/hr patch Place 1 patch (0.4 mg total) onto the skin daily.  [name] oxyCODONE (ROXICODONE) 5 MG immediate release tablet Take 1 tablet (5 mg total) by mouth every 4 (four) hours as needed for severe pain.  . hydrocortisone (ANUSOL-HC) 25 MG suppository Use 1 suppository every night at bedtime.   No facility-administered encounter medications on file as of 07/23/2014.   Allergies  Allergen Reactions  . Imdur [Isosorbide] Other (See Comments)    headache   Patient Active Problem List   Diagnosis Date Noted  . Pre-op testing 06/15/2014  . CKD (chronic kidney disease) 05/15/2014  . Hypoxia   . AKI (acute kidney injury)   . Aortic dissection, thoracoabdominal   . Dissecting aneurysm of thoracic aorta, Stanford type B 04/24/2014  . Aortic dissection, thoracic 04/24/2014  . Aortic dissection 04/23/2014  . Aneurysm of ascending aorta 03/28/2014  . Dyspnea 03/27/2014  . Prolonged Q-T interval on ECG 03/27/2014  . ETOH abuse 09/20/2013  . Hypertensive crisis 08/08/2013  . Sinusitis, bacterial 08/08/2013  . Malignant hypertension 02/20/2013  . Hypertensive urgency 01/27/2012  . Cardiomyopathy, hypertensive 01/26/2012  .  AI (aortic insufficiency) 01/26/2012  . MR (mitral regurgitation) 01/26/2012  . Acute renal failure superimposed on stage 3 chronic kidney disease 01/26/2012  . Systolic CHF, chronic [id number]  . Chronic bronchitis 05/23/2011  . Hypertensive emergency 05/22/2011  . Cardiomegaly - hypertensive 05/22/2011  . Tobacco abuse 05/22/2011  . Marijuana use 05/22/2011  . Abdominal pain 05/22/2011  . Hypertension, malignant  05/22/2011  . Anxiety and depression 05/22/2011  . Hyperlipidemia 08/26/2009  . Tobacco use 08/26/2009  . Headache(784.0) 08/26/2009  . Aortic valve disorder 03/11/2009  . INGUINAL HERNIA 02/18/2009  . Uncontrolled hypertension 01/16/2009  . MURMUR 01/16/2009   History   Social History  . Marital Status: Married    Spouse Name: N/A  . Number of Children: 5  . Years of Education: N/A   Occupational History  . Disabled, part-time Programmer, systems     Disability   Social History Main Topics  . Smoking status: Current Every Day Smoker -- 0.25 packs/day for 23 years    Types: Cigarettes    Last Attempt to Quit: 05/03/2014  . Smokeless tobacco: Former Systems developer    Types: Chew     Comment: as of 05-22-14 down to 3-4 cigs daily  . Alcohol Use: No     Comment: + previous use  . Drug Use: Yes    Special: Marijuana     Comment: 08/08/2013 "twice a month"  . Sexual Activity: Yes   Other Topics Concern  . Not on file   Social History Narrative    [name] family history includes Colon cancer in his paternal uncle; Diabetes in his maternal aunt; Heart attack in his brother; Hypertension in an other family member; Stroke in his maternal aunt. There is no history of Throat cancer, Pancreatic cancer, Esophageal cancer, Kidney disease, or Liver disease.      Objective:    Filed Vitals:   07/23/14 0830  BP: 100/60  Pulse: 96    Physical Exam  well-developed thin African-American male in no acute distress blood pressure 100/60 pulse 96 height 5 foot 11 weight 148. HEENT; nontraumatic normocephalic EOMI PERRLA sclera anicteric, Supple ;no JVD, Cardiovascular; regular rate and rhythm with [id number] systolic murmur, Pulmonary; clear bilaterally, Abdomen ;soft he has some mild tenderness in the epigastrium and hypogastrium no palpable mass or bowel sounds are present no audible bruit, Rectal; exam not repeated this was done at his last office visit and he had large edematous external hemorrhoid,  Extremities; no clubbing cyanosis or edema skin warm and dry, Psych; mood and affect appropriate       Assessment & Plan:   #1 44 yo AA male with persistent rectal bleeding with BM's BRB- likely secondary to external hemorrhoid. #2 Type B aortic dissection 04/2014  With extension on most recent imaging- pt is symptomatic with exertion dyspnea, fatigue, chest abdomen and back pain.-awaiting referral to [facility] /Dr [name] for management Elly Modena. #3 Cardiomyopathy -ef 35-40% #4 aortic insuff/MR #6 HTN #7 CKD #8 renal infarct 04/2014  Plan; Pt is not an appropriate candidate for colonoscopy and needs repair of dissected aneurysm  Before and endoscopic evaluation. This could be done at [facility] in patient if surgery desires pre-op Will treat external hemorrhoidal sxs with Anusol hc supp /cream  Encouraged pt to contact CVTS today to expedite his  appt . Medea Deines S Gianmarco Roye PA-C 07/23/2014   Cc: Grace Isaac, MD

## 2014-07-23 NOTE — Telephone Encounter (Signed)
Spoke to [place] and advised him since the prescription for the Anusol HC Suppositories was $191.00 , Amy said he could get OTC rectal suppositories. He said he purchased [employer].  He thanked me for calling him.

## 2014-07-23 NOTE — Progress Notes (Signed)
Reviewed and agree with management plan.  Gearline Spilman T. Roselina Burgueno, MD FACG 

## 2014-07-23 NOTE — Patient Instructions (Addendum)
You have been given a separate informational sheet regarding your tobacco use, the importance of quitting and local resources to help you quit. Please go to the basement level to have your labs drawn.  We sent a prescription to [place]. 1. Anusol HC Suppositories.  We have printed your  records for you.

## 2014-07-23 NOTE — Telephone Encounter (Signed)
Spoke to [employer] the pharmacist, [employer] [place] and advised him to cancel this patient's prescription for Anusol HC Suppositories. The cost is 191.00 and the patient cannot afford this.  We will advise the patient to use OTC medication.

## 2014-08-02 ENCOUNTER — Encounter: Payer: Self-pay | Admitting: Cardiology

## 2014-08-02 ENCOUNTER — Ambulatory Visit (INDEPENDENT_AMBULATORY_CARE_PROVIDER_SITE_OTHER): Payer: Medicare Other | Admitting: Cardiology

## 2014-08-02 VITALS — BP 142/88 | HR 81 | Ht 71.0 in | Wt 149.0 lb

## 2014-08-02 DIAGNOSIS — I119 Hypertensive heart disease without heart failure: Secondary | ICD-10-CM | POA: Diagnosis not present

## 2014-08-02 DIAGNOSIS — I10 Essential (primary) hypertension: Secondary | ICD-10-CM

## 2014-08-02 DIAGNOSIS — N183 Chronic kidney disease, stage 3 (moderate): Secondary | ICD-10-CM

## 2014-08-02 DIAGNOSIS — I351 Nonrheumatic aortic (valve) insufficiency: Secondary | ICD-10-CM

## 2014-08-02 DIAGNOSIS — I429 Cardiomyopathy, unspecified: Secondary | ICD-10-CM

## 2014-08-02 DIAGNOSIS — I43 Cardiomyopathy in diseases classified elsewhere: Secondary | ICD-10-CM

## 2014-08-02 DIAGNOSIS — I7100 Dissection of unspecified site of aorta: Secondary | ICD-10-CM | POA: Diagnosis not present

## 2014-08-02 MED ORDER — SPIRONOLACTONE 25 MG PO TABS: ORAL_TABLET | ORAL | Status: DC

## 2014-08-02 NOTE — Patient Instructions (Signed)
Medication Instructions:  Start spironolactone 12.5mg  daily. This will be 1/2 of a 25mg  tablet daily.  Labwork: BMET in about 10 days.  Testing/Procedures: None today.  Follow-Up: Your physician recommends that you schedule a follow-up appointment in: 6 weeks with Richardson Dopp, PA,c

## 2014-08-02 NOTE — Progress Notes (Signed)
Patient ID: PAITYN PHIMMASONE, male   DOB: [birth year], 44 y.o.   MRN: [id number]   44 yo with history of uncontrolled HTN, valvular heart disease, and chronic type B aortic dissection presents for cardiology followup.  In 3/13, he was admitted with severe dyspnea and was found to have BP > 200/100 with pulmonary edema.  He was diuresed and BP was controlled.  Echo showed EF 35-40% with diffuse hypokinesis and moderate to severe mitral regurgitation.  Cardiomyopathy was thought to be most likely due to HTN and ETOH abuse.  Cardiac cath was not done due to elevated creatinine, which was thought to be secondary to uncontrolled BP as well.  Instead, I had him do a Lexiscan myoview in 4/13, which showed EF 37% and no ischemia or infarction.  He was admitted again in 6/15 with hypertensive emergency (had run out of meds).  Echo in 12/14 showed EF 45-50% with moderate LVH, mild MR, and moderate AI.    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  The descending thoracic aorta has dilated to 5.1 cm.  Patient saw Dr. Servando Snare to discuss dissection repair. It appears that he will need repair of the arch and descending thoracic aorta.  He has been referred to [facility] for opinion on arch/descending thoracic aorta surgical replacement versus stent versus observation.    Most recent echo in 4/16 showed EF 55-60% with moderate AI and moderate MR.  LHC done in 4/16 in preparation for possible surgery showed 60% ostial D1, otherwise no significant disease.    He is symptomatically stable.  When he uses his arms, he will notice a right upper back and right flank pain. This has been present ever since the dissection.  No chest pain.   He is avoiding heavy lifting.  No exertional dyspnea.                                                                                                                                                                                                                           Labs (3/13): K 4.2, creatinine 1.7, urinary metanephrine and catecholeamine levels normal, LDL 94, HDL 38, pro-BNP 7080 => 2737 Labs (6/15): K 3.9, creatinine 1.53 Labs (5/16): K 3.6, creatinine 1.87, HCT 29.9  PMH: 1. H/o ankle fractures bilaterally 2. GERD 3. HTN: Negative urinary  catecholeamine collection for pheochromocytoma.  4. CKD: Suspect hypertensive nephropathy.  5. Cardiomyopathy: Most likely due to heavy ETOH ingestion and uncontrolled HTN.   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  Lexiscan myoview (4/13): EF 37%, global hypokinesis, no ischemic or infarction.  Echo (12/14) with EF 45-50%, diffuse hypokinesis, moderate LVH, mild MR, moderate AI.  Echo (4/16) with EF 55-60%, severe LVH, mild LV dilation, moderate AI and moderate MR.   6. ETOH abuse: Quit 3/13.  7. Active smoker. 8. Valvular heart disease: Echo in 3/13 showed moderate to severe eccentric MR and mild to moderate AI with prolapsing left coronary cusp. Echo 12/14 with mild MR and moderate AI.  Echo 4/16 with moderate AI and moderate MR.  9. Headache with Imdur.  10. Type B aortic dissection: Occurred in 2/16, from just distal to the left subclavian.  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  Descending thoracic aorta size 5.1 cm with ascending aorta 4.4 cm when most recently measured.  11. CAD: LHC (4/16) with 60% ostial D1.    SH: On disability.  Married, has 5 biological children and 5 adopted children.  H/o heavy ETOH use, about 2 pints liquor/day until he quit in 6/15.  Smokes but now down to 6 cigs/day.    ROS:  All systems reviewed and negative except as per HPI.   FH: No premature CAD. +HTN.   Current Outpatient Prescriptions  Medication Sig Dispense  Refill  . amLODipine (NORVASC) 10 MG tablet Take 1 tablet by mouth daily.    [name] atorvastatin (LIPITOR) 20 MG tablet Take 20 mg by mouth daily.    . cloNIDine (CATAPRES) 0.2 MG tablet Take 1 tablet (0.2 mg total) by mouth 3 (three) times daily. 90 tablet 1  . hydrALAZINE (APRESOLINE) 100 MG tablet Take 1 tablet (100 mg total) by mouth 3 (three) times daily. 90 tablet 1  . hydrocortisone (ANUSOL-HC) 25 MG suppository Use 1 suppository every night at bedtime. 30 suppository 3  . labetalol (NORMODYNE) 200 MG tablet Take 2 tablets (400 mg total) by mouth daily. 60 tablet 11  . minoxidil (LONITEN) 2.5 MG tablet Take 1 tablet (2.5 mg total) by mouth 2 (two) times daily. 60 tablet 3  . nitroGLYCERIN (NITRODUR - DOSED IN MG/24 HR) 0.4 mg/hr patch Place 1 patch (0.4 mg total) onto the skin daily. 30 patch 1  . oxyCODONE (ROXICODONE) 5 MG immediate release tablet Take 1 tablet (5 mg total) by mouth every 4 (four) hours as needed for severe pain. 15 tablet 0  . spironolactone (ALDACTONE) 25 MG tablet 1/2 tablet (12.5mg ) daily 45 tablet 1   No current facility-administered medications for this visit.    BP 142/88 mmHg  Pulse 81  Ht 5\' 11"  (1.803 m)  Wt 149 lb (67.586 kg)  BMI 20.79 kg/m2  SpO2 98% General: NAD Neck: No JVD, no thyromegaly or thyroid nodule.  Lungs: Clear to auscultation bilaterally with normal respiratory effort. CV: Nondisplaced PMI.  Heart regular S1/S2, no S3/S4, 2/6 HSM ape, 3/6 diastolic murmur along the sternal border.  No peripheral edema.  No carotid bruit.  Normal pedal pulses.  Abdomen: Soft, nontender, no hepatosplenomegaly, no distention.  Skin: Intact without lesions or rashes.  Neurologic: Alert and oriented x 3.  Psych: Normal affect. Extremities: No clubbing or cyanosis.  HEENT: Normal.   Assessment/Plan: 1. Hypertension: Strong  family history of HTN.  He has end-organ damage with CKD, type B dissection, and probable hypertensive cardiomyopathy (EF has recovered).    SBP 130s-140s at home. Given dissection, would like to see lower.  - Resistant HTN => add spironolactone 12.5 mg daily with BMET in 10 days.  2. Cardiomyopathy: EF recovered to 55-60% on last echo.  He is not volume overloaded.  Suspect hypertensive +/- ETOH cardiomyopathy.    3. CKD: Hypertensive nephrosclerosis. Needs BP control to try to avoid progression of CKD.  4. Valvular heart disease:  Moderate AI, moderate MR.  Loud murmurs on exam.  Repeat echo in 4/17. 5. Type B aortic dissection: Seeing Dr Servando Snare.  Descending thoracic aorta measured 5.1 cm.  It appears that he will need repair of the arch and descending thoracic aorta.  He has been referred to [facility] for opinion on arch/descending thoracic aorta surgical replacement versus stent versus observation.   6. CAD: Nonobstructive CAD on 4/16 cath.  He is on statin and ASA 81.  He will need lipids/LFTs checked in 2 months.    [name] 08/02/2014

## 2014-08-08 DIAGNOSIS — I351 Nonrheumatic aortic (valve) insufficiency: Secondary | ICD-10-CM | POA: Diagnosis not present

## 2014-08-08 DIAGNOSIS — N189 Chronic kidney disease, unspecified: Secondary | ICD-10-CM | POA: Diagnosis not present

## 2014-08-08 DIAGNOSIS — I129 Hypertensive chronic kidney disease with stage 1 through stage 4 chronic kidney disease, or unspecified chronic kidney disease: Secondary | ICD-10-CM | POA: Diagnosis not present

## 2014-08-08 DIAGNOSIS — Z8679 Personal history of other diseases of the circulatory system: Secondary | ICD-10-CM | POA: Diagnosis not present

## 2014-08-08 DIAGNOSIS — I7103 Dissection of thoracoabdominal aorta: Secondary | ICD-10-CM | POA: Diagnosis not present

## 2014-08-14 ENCOUNTER — Other Ambulatory Visit (INDEPENDENT_AMBULATORY_CARE_PROVIDER_SITE_OTHER): Payer: Medicare Other | Admitting: *Deleted

## 2014-08-14 DIAGNOSIS — I7100 Dissection of unspecified site of aorta: Secondary | ICD-10-CM | POA: Diagnosis not present

## 2014-08-14 DIAGNOSIS — N183 Chronic kidney disease, stage 3 (moderate): Secondary | ICD-10-CM | POA: Diagnosis not present

## 2014-08-14 DIAGNOSIS — N280 Ischemia and infarction of kidney: Secondary | ICD-10-CM | POA: Diagnosis not present

## 2014-08-14 DIAGNOSIS — I10 Essential (primary) hypertension: Secondary | ICD-10-CM

## 2014-08-14 LAB — BASIC METABOLIC PANEL
BUN: 11 mg/dL (ref 6–23)
CO2: 23 mEq/L (ref 19–32)
Calcium: 9 mg/dL (ref 8.4–10.5)
Chloride: 110 mEq/L (ref 96–112)
Creatinine, Ser: 1.49 mg/dL (ref 0.40–1.50)
GFR: 66.49 mL/min (ref >60.00)
Glucose, Bld: 97 mg/dL (ref 70–99)
POTASSIUM: 3.9 meq/L (ref 3.5–5.1)
SODIUM: 138 meq/L (ref 135–145)

## 2014-08-14 NOTE — Addendum Note (Signed)
Addended by: Eulis Foster on: 08/14/2014 10:03 AM   Modules accepted: Orders

## 2014-08-28 ENCOUNTER — Telehealth: Payer: Self-pay | Admitting: Cardiology

## 2014-08-28 NOTE — Telephone Encounter (Signed)
error 

## 2014-08-30 DIAGNOSIS — N183 Chronic kidney disease, stage 3 (moderate): Secondary | ICD-10-CM | POA: Diagnosis not present

## 2014-08-30 DIAGNOSIS — I10 Essential (primary) hypertension: Secondary | ICD-10-CM | POA: Diagnosis not present

## 2014-08-30 DIAGNOSIS — N280 Ischemia and infarction of kidney: Secondary | ICD-10-CM | POA: Diagnosis not present

## 2014-08-30 DIAGNOSIS — I7100 Dissection of unspecified site of aorta: Secondary | ICD-10-CM | POA: Diagnosis not present

## 2014-09-02 ENCOUNTER — Encounter (HOSPITAL_COMMUNITY): Payer: Self-pay | Admitting: *Deleted

## 2014-09-02 ENCOUNTER — Emergency Department (HOSPITAL_COMMUNITY)
Admission: EM | Admit: 2014-09-02 | Discharge: 2014-09-02 | Disposition: A | Discharge status: Home / Self Care | Payer: Medicare Other | Attending: Emergency Medicine | Admitting: Emergency Medicine

## 2014-09-02 ENCOUNTER — Emergency Department (HOSPITAL_COMMUNITY): Payer: Medicare Other

## 2014-09-02 DIAGNOSIS — M62838 Other muscle spasm: Secondary | ICD-10-CM | POA: Diagnosis not present

## 2014-09-02 DIAGNOSIS — K219 Gastro-esophageal reflux disease without esophagitis: Secondary | ICD-10-CM | POA: Insufficient documentation

## 2014-09-02 DIAGNOSIS — I71012 Dissection of descending thoracic aorta: Secondary | ICD-10-CM

## 2014-09-02 DIAGNOSIS — I129 Hypertensive chronic kidney disease with stage 1 through stage 4 chronic kidney disease, or unspecified chronic kidney disease: Secondary | ICD-10-CM | POA: Insufficient documentation

## 2014-09-02 DIAGNOSIS — I7101 Dissection of thoracic aorta: Secondary | ICD-10-CM | POA: Diagnosis not present

## 2014-09-02 DIAGNOSIS — I5042 Chronic combined systolic (congestive) and diastolic (congestive) heart failure: Secondary | ICD-10-CM | POA: Insufficient documentation

## 2014-09-02 DIAGNOSIS — N189 Chronic kidney disease, unspecified: Secondary | ICD-10-CM | POA: Insufficient documentation

## 2014-09-02 DIAGNOSIS — R079 Chest pain, unspecified: Secondary | ICD-10-CM | POA: Insufficient documentation

## 2014-09-02 DIAGNOSIS — E785 Hyperlipidemia, unspecified: Secondary | ICD-10-CM | POA: Diagnosis not present

## 2014-09-02 DIAGNOSIS — Z79899 Other long term (current) drug therapy: Secondary | ICD-10-CM | POA: Diagnosis not present

## 2014-09-02 DIAGNOSIS — Z720 Tobacco use: Secondary | ICD-10-CM | POA: Diagnosis not present

## 2014-09-02 DIAGNOSIS — F419 Anxiety disorder, unspecified: Secondary | ICD-10-CM | POA: Insufficient documentation

## 2014-09-02 DIAGNOSIS — Z8701 Personal history of pneumonia (recurrent): Secondary | ICD-10-CM | POA: Insufficient documentation

## 2014-09-02 DIAGNOSIS — M791 Myalgia, unspecified site: Secondary | ICD-10-CM

## 2014-09-02 DIAGNOSIS — M25512 Pain in left shoulder: Secondary | ICD-10-CM | POA: Diagnosis not present

## 2014-09-02 DIAGNOSIS — I7102 Dissection of abdominal aorta: Secondary | ICD-10-CM | POA: Diagnosis not present

## 2014-09-02 DIAGNOSIS — I712 Thoracic aortic aneurysm, without rupture: Secondary | ICD-10-CM | POA: Diagnosis not present

## 2014-09-02 DIAGNOSIS — G8929 Other chronic pain: Secondary | ICD-10-CM | POA: Insufficient documentation

## 2014-09-02 LAB — BASIC METABOLIC PANEL
ANION GAP: 7 (ref 5–15)
BUN: 13 mg/dL (ref 6–20)
CO2: 26 mmol/L (ref 22–32)
Calcium: 9.2 mg/dL (ref 8.9–10.3)
Chloride: 104 mmol/L (ref 101–111)
Creatinine, Ser: 1.47 mg/dL — ABNORMAL HIGH (ref 0.61–1.24)
GFR calc Af Amer: >60 mL/min (ref >60)
GFR, EST NON AFRICAN AMERICAN: 57 mL/min — AB (ref >60)
GLUCOSE: 106 mg/dL — AB (ref 65–99)
Potassium: 4.1 mmol/L (ref 3.5–5.1)
Sodium: 137 mmol/L (ref 135–145)

## 2014-09-02 LAB — CBC WITH DIFFERENTIAL/PLATELET
Basophils Absolute: 0 10*3/uL (ref 0.0–0.1)
Basophils Relative: 0 % (ref 0–1)
EOS ABS: 0.2 10*3/uL (ref 0.0–0.7)
EOS PCT: 3 % (ref 0–5)
HEMATOCRIT: 33.6 % — AB (ref 39.0–52.0)
Hemoglobin: 11.1 g/dL — ABNORMAL LOW (ref 13.0–17.0)
LYMPHS ABS: 2.2 10*3/uL (ref 0.7–4.0)
LYMPHS PCT: 38 % (ref 12–46)
MCH: 29.2 pg (ref 26.0–34.0)
MCHC: 33 g/dL (ref 30.0–36.0)
MCV: 88.4 fL (ref 78.0–100.0)
MONOS PCT: 10 % (ref 3–12)
Monocytes Absolute: 0.6 10*3/uL (ref 0.1–1.0)
Neutro Abs: 2.8 10*3/uL (ref 1.7–7.7)
Neutrophils Relative %: 49 % (ref 43–77)
Platelets: 234 10*3/uL (ref 150–400)
RBC: 3.8 MIL/uL — AB (ref 4.22–5.81)
RDW: 14.1 % (ref 11.5–15.5)
WBC: 5.7 10*3/uL (ref 4.0–10.5)

## 2014-09-02 LAB — TROPONIN I

## 2014-09-02 MED ORDER — OXYCODONE-ACETAMINOPHEN 5-325 MG PO TABS
1.0000 | ORAL_TABLET | Freq: Once | ORAL | Status: AC
  Administered 2014-09-02: 1 via ORAL
  Filled 2014-09-02: qty 1

## 2014-09-02 MED ORDER — IOHEXOL 350 MG/ML SOLN
100.0000 mL | Freq: Once | INTRAVENOUS | Status: AC | PRN
  Administered 2014-09-02: 100 mL via INTRAVENOUS

## 2014-09-02 MED ORDER — ONDANSETRON HCL 4 MG/2ML IJ SOLN
4.0000 mg | Freq: Once | INTRAMUSCULAR | Status: AC
  Administered 2014-09-02: 4 mg via INTRAVENOUS
  Filled 2014-09-02: qty 2

## 2014-09-02 MED ORDER — OXYCODONE HCL 5 MG PO TABS
5.0000 mg | ORAL_TABLET | ORAL | Status: DC | PRN
Start: 2014-09-02 — End: 2014-09-10

## 2014-09-02 MED ORDER — MORPHINE SULFATE 4 MG/ML IJ SOLN
4.0000 mg | Freq: Once | INTRAMUSCULAR | Status: AC
  Administered 2014-09-02: 4 mg via INTRAVENOUS
  Filled 2014-09-02: qty 1

## 2014-09-02 NOTE — Discharge Instructions (Signed)

## 2014-09-02 NOTE — ED Notes (Signed)
The pt is c/o lt upper chest pain with lt arm radaition for 7-8 days with nausea

## 2014-09-02 NOTE — ED Provider Notes (Addendum)
CSN: [id number]     Arrival date & time 09/02/14  1626 History   First MD Initiated Contact with Patient 09/02/14 1741     Chief Complaint  Patient presents with  . 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  He woke up and was stretching his arms when the pain occurred in his L axilla and the pain radiated down his arm and on the left side of his chest.  On the same day he also felt like he got food stuck in his esophagus as well as his daily meds, he felt when he drank fluids they just sat in his esophagus, this sensation did pass, and he is now able to drink fluids and is eating small meals.  He feels a burning sensation when he eats and reports having bad heartburn recently, he feels his pain  in his chest and arm are aggravated by eating.  He has had nausea and one episode of non-bloody vomiting yesterday, he has not had any fever, headache, diarrhea, and has been having regular BM's.  The pain is also aggravated by moving and laying down.  He has been taking 1,500mg  of extra strength tylenol for his pain with little relief, he has had difficulty sleeping as well.  He is taking all medications as prescribed,     Past Medical History  Diagnosis Date  . Hypertension     a. Hx of HTN urgency secondary to noncompliance. b. urinary metanephrine and catecholeamine levels normal 2013.  [name] Anxiety   . GERD (gastroesophageal reflux disease)   . Acute renal failure     June 2012 felt secondary to Toradol  . Chronic renal failure   . Chronic combined systolic and diastolic congestive heart failure     a. 05/2011: Adm with pulm edema/HTN urgency, EF 35-40% with diffuse hypokinesis and moderate to severe mitral regurgitation. Cardiomyopathy likely  due to uncontrolled HTN and ETOH abuse - cath deferred due to renal insufficiency (felt due to uncontrolled HTN). bJodie Echevaria MV 06/2011: EF 37% and no ischemia or infarction. c. EF 45-50% by echo 01/2012.  . Cardiomyopathy     non-ischemic - probably related to untreated HTN and ETOH abuse  . HYPERLIPIDEMIA   . INGUINAL HERNIA   . ETOH abuse     a. Reported to have quit 05/2011.  . Valvular heart disease     a. Echo 05/2011: moderate to severe eccentric MR and mild to moderate AI with prolapsing left coronary cusp. b. Echo 01/2012: mild-mod AI, mild dilitation of aortic root, mild MR.;  b. 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 CKD (chronic kidney disease)     a. 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  peak creatinine 3.46 during admx for aortic dissection 2/16  . Tobacco abuse   . Pneumonia ~ 2013  . Headache(784.0)     "q other day" (08/08/2013)  . Chronic sinusitis   . History of medication noncompliance   . Chronic abdominal pain   . DDD (degenerative disc disease), lumbar   . Aortic dissection     a. admx 04/2014 >> L renal infarct; a/c renal failure >>  VVS to re-eval 05/2014 to decide +/- surgery   Past Surgical History  Procedure Laterality Date  . Ankle surgery      Fractures bilaterally  . Inguinal hernia repair Right ~ 1996  . Foot fracture surgery Bilateral 2004-2010    "got pins in both of them"  . Left heart catheterization with coronary angiogram N/A 06/21/2014    Procedure: LEFT HEART CATHETERIZATION WITH CORONARY ANGIOGRAM;  Surgeon: Larey Dresser, MD;  Location: [facility] CATH LAB;  Service: Cardiovascular;  Laterality: N/A;   Family History  Problem Relation Age of Onset  . Hypertension    . Colon cancer Paternal Uncle   . Stroke Maternal Aunt   . Heart attack Brother   . Diabetes Maternal Aunt   . Throat cancer Neg Hx   . Pancreatic cancer Neg Hx   . Esophageal cancer Neg Hx    . Kidney disease Neg Hx   . Liver disease Neg Hx    History  Substance Use Topics  . Smoking status: Current Every Day Smoker -- 0.25 packs/day for 23 years    Types: Cigarettes    Last Attempt to Quit: 05/03/2014  . Smokeless tobacco: Former Systems developer    Types: Chew     Comment: as of 05-22-14 down to 3-4 cigs daily  . Alcohol Use: No     Comment: + previous use    Review of Systems  10 Systems reviewed and are negative for acute change except as noted in the HPI.    Allergies  Imdur  Home Medications   Prior to Admission medications   Medication Sig Start Date End Date Taking? Authorizing Provider  acetaminophen (TYLENOL) 500 MG tablet Take 1,500 mg by mouth 2 (two) times daily as needed for moderate pain.   Yes Historical Provider, MD  amLODipine (NORVASC) 10 MG tablet Take 1 tablet by mouth daily. 06/18/14  Yes Historical Provider, MD  atorvastatin (LIPITOR) 20 MG tablet Take 20 mg by mouth daily.   Yes Historical Provider, MD  cloNIDine (CATAPRES) 0.2 MG tablet Take 1 tablet (0.2 mg total) by mouth 3 (three) times daily. 05/03/14  Yes Orson Eva, MD  hydrALAZINE (APRESOLINE) 100 MG tablet Take 1 tablet (100 mg total) by mouth 3 (three) times daily. Patient taking differently: Take 200 mg by mouth 3 (three) times daily.  05/03/14  Yes Orson Eva, MD  labetalol (NORMODYNE) 200 MG tablet Take 2 tablets (400 mg total) by mouth daily. 06/15/14  Yes Scott Joylene Draft, PA-C  minoxidil (LONITEN) 2.5 MG tablet Take 1 tablet (2.5 mg total) by mouth 2 (two) times daily. 06/15/14  Yes Scott Joylene Draft, PA-C  nitroGLYCERIN (NITRODUR - DOSED IN MG/24 HR) 0.4 mg/hr patch Place 1 patch (0.4 mg total) onto the skin daily. 05/03/14  Yes Orson Eva, MD  spironolactone (ALDACTONE) 25 MG tablet 1/2 tablet (12.5mg ) daily Patient taking differently: 12.5 mg. 1/2 tablet (12.5mg ) daily 08/02/14  Yes Larey Dresser, MD  hydrocortisone (ANUSOL-HC) 25 MG suppository Use 1 suppository every night at bedtime. 07/23/14   Amy S  Esterwood, PA-C  oxyCODONE (ROXICODONE) 5 MG immediate release tablet Take 1 tablet (5 mg total) by mouth every 4 (four) hours as needed for severe pain. 09/02/14   Zaire Levesque Carlota Raspberry, PA-C   BP 140/87 mmHg  Pulse 67  Temp(Src) 98.7 F (37.1 C) (Oral)  Resp 15  SpO2 98% Physical Exam  Constitutional: He appears well-developed and well-nourished. He appears distressed (anxiety and pain).  HENT:  Head: Normocephalic  and atraumatic.  Eyes: Pupils are equal, round, and reactive to light.  Neck: Normal range of motion. Neck supple.  Cardiovascular: Normal rate and regular rhythm.   Pulmonary/Chest: Effort normal and breath sounds normal. He has no decreased breath sounds. He has no wheezes. He exhibits tenderness. He exhibits no bony tenderness and no swelling.  Abdominal: Soft.  Musculoskeletal:       Left shoulder: He exhibits pain and spasm. He exhibits normal pulse and normal strength.  Neurological: He is alert.  Skin: Skin is warm and dry. He is not diaphoretic.  Psychiatric: His mood appears anxious.  Nursing note and vitals reviewed.   ED Course  Procedures (including critical care time) Labs Review Labs Reviewed  BASIC METABOLIC PANEL - Abnormal; Notable for the following:    Glucose, Bld 106 (*)    Creatinine, Ser 1.47 (*)    GFR calc non Af Amer 57 (*)    All other components within normal limits  CBC WITH DIFFERENTIAL/PLATELET - Abnormal; Notable for the following:    RBC 3.80 (*)    Hemoglobin 11.1 (*)    HCT 33.6 (*)    All other components within normal limits  TROPONIN I    Imaging Review Dg Chest 2 View  09/02/2014   CLINICAL DATA:  Left chest pain  EXAM: CHEST  2 VIEW  COMPARISON:  CT 07/12/2014, chest radiograph 07/12/2014  FINDINGS: Marked aortic aneurysmal dilatation reidentified with unfolding. Moderate enlargement of the cardiac silhouette is noted without evidence for edema. No focal pulmonary opacity. No pleural effusion. No acute osseous finding.   IMPRESSION: Moderate cardiomegaly and marked aortic aneurysmal dilatation reidentified.   Electronically Signed   By: Conchita Paris M.D.   On: 09/02/2014 17:23   Ct Angio Chest Aorta W/cm &/or Wo/cm  09/02/2014   CLINICAL DATA:  Patient with known dissection. Left upper chest and left arm pain for 7-8 days.  EXAM: CT ANGIOGRAPHY CHEST, ABDOMEN AND PELVIS  TECHNIQUE: Multidetector CT imaging through the chest, abdomen and pelvis was performed using the standard protocol during bolus administration of intravenous contrast. Multiplanar reconstructed images and MIPs were obtained and reviewed to evaluate the vascular anatomy.  CONTRAST:  144mL OMNIPAQUE IOHEXOL 350 MG/ML SOLN  COMPARISON:  CTA chest 04/23/2014; 06/29/2014  FINDINGS: CTA CHEST FINDINGS  Re- demonstrated findings compatible with type B dissection. Overall descending thoracic aorta is grossly similar in size and configuration compared to prior examination. Proximal descending thoracic aorta measures 4 cm (image 71; series 5), previously 3.9 cm. Transverse thoracic aorta measures 5.1 cm (image 47; series 5), previously 5.1 cm. Ascending thoracic aorta measures 4.4 cm, unchanged. Normal configuration of the takeoff of the great vessels. No evidence for dissection flap extending into the great vessels. Additionally there is no evidence for ascending thoracic aortic dissection. The true and false lumens remain patent.  Visualized thyroid is unremarkable. No enlarged axillary, mediastinal or hilar lymphadenopathy. Grossly unchanged prevascular soft tissue measuring 5.8 x 1.8 cm (image 60; series 5). Normal heart size. No pericardial effusion.  Central airways are patent. No large consolidative pulmonary opacities. No pleural effusion or pneumothorax.  Review of the MIP images confirms the above findings.  CTA ABDOMEN AND PELVIS FINDINGS  Type B dissection appears to terminate at an eccentric saccular outpouching about the left renal artery, grossly  unchanged from prior. Overall size of the abdominal aorta is grossly similar compared to prior examination measuring 3.3 cm within the proximal abdomen (image 147; series 5) and 3.2  cm within the mid abdomen (image 176; series 5). Unchanged thrombus within the distal false lumen. There is suggestion of possible extension of the dissection flap into the proximal superior mesenteric artery (image 171 ; series 5). The celiac axis remains patent and is supplied by the false lumen. The left renal artery abuts the dissection which narrows the origin however the artery is patent. Right renal artery is patent and supplied by the true lumen. Re- demonstrated ectasia of the left common iliac artery. Re- demonstrated left internal iliac artery ectasia.  Phase of contrast limits evaluation of abdomen.  Liver is normal in size and contour without focal hepatic lesion identified. Gallbladder is unremarkable. Pancreas and spleen are unremarkable. Normal bilateral adrenal glands. Mild residual delayed enhancement of superior pole of the left kidney. No hydronephrosis. No retroperitoneal lymphadenopathy. Urinary bladder is grossly unremarkable. Prostate unremarkable. No definite evidence for bowel obstruction. No definite free fluid or free intraperitoneal air. No aggressive or acute appearing osseous lesions.  Review of the MIP images confirms the above findings.  IMPRESSION: Re- demonstrated Type B thoracic dissection extending throughout the thoracic aorta into the abdominal aorta, terminating at the level of the iliac arteries. Suggestion of possible small dissection flap extending into proximal aspect of the superior mesenteric artery, new from prior. The superior mesenteric artery is patent.  Otherwise overall similar appearance to dissection and size of the abdominal aorta without evidence for rupture.  Re- demonstrated ill defined soft tissue within the anterior mediastinum, nonspecific, potentially representing reactive  adenopathy or thymoma. Continued attention on followup is recommended.   Electronically Signed   By: Lovey Newcomer M.D.   On: 09/02/2014 21:10   Ct Angio Abd/pel W/ And/or W/o  09/02/2014   CLINICAL DATA:  Patient with known dissection. Left upper chest and left arm pain for 7-8 days.  EXAM: CT ANGIOGRAPHY CHEST, ABDOMEN AND PELVIS  TECHNIQUE: Multidetector CT imaging through the chest, abdomen and pelvis was performed using the standard protocol during bolus administration of intravenous contrast. Multiplanar reconstructed images and MIPs were obtained and reviewed to evaluate the vascular anatomy.  CONTRAST:  134mL OMNIPAQUE IOHEXOL 350 MG/ML SOLN  COMPARISON:  CTA chest 04/23/2014; 06/29/2014  FINDINGS: CTA CHEST FINDINGS  Re- demonstrated findings compatible with type B dissection. Overall descending thoracic aorta is grossly similar in size and configuration compared to prior examination. Proximal descending thoracic aorta measures 4 cm (image 71; series 5), previously 3.9 cm. Transverse thoracic aorta measures 5.1 cm (image 47; series 5), previously 5.1 cm. Ascending thoracic aorta measures 4.4 cm, unchanged. Normal configuration of the takeoff of the great vessels. No evidence for dissection flap extending into the great vessels. Additionally there is no evidence for ascending thoracic aortic dissection. The true and false lumens remain patent.  Visualized thyroid is unremarkable. No enlarged axillary, mediastinal or hilar lymphadenopathy. Grossly unchanged prevascular soft tissue measuring 5.8 x 1.8 cm (image 60; series 5). Normal heart size. No pericardial effusion.  Central airways are patent. No large consolidative pulmonary opacities. No pleural effusion or pneumothorax.  Review of the MIP images confirms the above findings.  CTA ABDOMEN AND PELVIS FINDINGS  Type B dissection appears to terminate at an eccentric saccular outpouching about the left renal artery, grossly unchanged from prior. Overall size  of the abdominal aorta is grossly similar compared to prior examination measuring 3.3 cm within the proximal abdomen (image 147; series 5) and 3.2 cm within the mid abdomen (image 176; series 5). Unchanged thrombus within the distal  false lumen. There is suggestion of possible extension of the dissection flap into the proximal superior mesenteric artery (image 171 ; series 5). The celiac axis remains patent and is supplied by the false lumen. The left renal artery abuts the dissection which narrows the origin however the artery is patent. Right renal artery is patent and supplied by the true lumen. Re- demonstrated ectasia of the left common iliac artery. Re- demonstrated left internal iliac artery ectasia.  Phase of contrast limits evaluation of abdomen.  Liver is normal in size and contour without focal hepatic lesion identified. Gallbladder is unremarkable. Pancreas and spleen are unremarkable. Normal bilateral adrenal glands. Mild residual delayed enhancement of superior pole of the left kidney. No hydronephrosis. No retroperitoneal lymphadenopathy. Urinary bladder is grossly unremarkable. Prostate unremarkable. No definite evidence for bowel obstruction. No definite free fluid or free intraperitoneal air. No aggressive or acute appearing osseous lesions.  Review of the MIP images confirms the above findings.  IMPRESSION: Re- demonstrated Type B thoracic dissection extending throughout the thoracic aorta into the abdominal aorta, terminating at the level of the iliac arteries. Suggestion of possible small dissection flap extending into proximal aspect of the superior mesenteric artery, new from prior. The superior mesenteric artery is patent.  Otherwise overall similar appearance to dissection and size of the abdominal aorta without evidence for rupture.  Re- demonstrated ill defined soft tissue within the anterior mediastinum, nonspecific, potentially representing reactive adenopathy or thymoma. Continued  attention on followup is recommended.   Electronically Signed   By: Lovey Newcomer M.D.   On: 09/02/2014 21:10     EKG Interpretation None      MDM   Final diagnoses:  Chest pain  Dissecting aneurysm of thoracic aorta, Stanford type B  Muscle pain    Assessment:  Musculoskeletal strain, all labs and imaging appear to be unchanged.  The history of the pain onset with movement, then aggravated with movement, and reproducible with palpation is suggestive of musculoskeletal pain.  As patient does have an aortic dissection we recommend close f/u with his cardiothoracic surgeon in regards to his pain and visit to the ED, he is due for surgery within the next month. Drug Therapy in the ED: morphine 4mg  injection once for pain, zofran injection 4mg  once for nausea Medications for home: Oxycodone 5mg  tab #10 tabs 0 refills for pain management Non-Drug Therapy: none Labs: Troponin (-), CBC w/ Diff, BMP Imaging: CT angio of chest and abdomen/pelvisw/ contrast to r/o acute changes in his aortic dissection and to investigate for any processes in the abdomen that could be causing his pain.    Results: Largely, unchanged aortic dissection, no acute abdominal findings CXR: unchanged Other tests and studies:  EKG F/u: call cardiology tomorrow   Medications  morphine 4 MG/ML injection 4 mg (4 mg Intravenous Given 09/02/14 1939)  ondansetron (ZOFRAN) injection 4 mg (4 mg Intravenous Given 09/02/14 1937)  iohexol (OMNIPAQUE) 350 MG/ML injection 100 mL (100 mLs Intravenous Contrast Given 09/02/14 2008)  oxyCODONE-acetaminophen (PERCOCET/ROXICET) 5-325 MG per tablet 1 tablet (1 tablet Oral Given 09/02/14 2147)    44 y.o.[name]'s evaluation in the Emergency Department is complete. It has been determined that no acute conditions requiring further emergency intervention are present at this time. The patient/guardian have been advised of the diagnosis and plan. We have discussed signs and symptoms that  warrant return to the ED, such as changes or worsening in symptoms.  Vital signs are stable at discharge. Filed Vitals:  09/02/14 2130  BP: 140/87  Pulse: 67  Temp:   Resp: 15    Patient/guardian has voiced understanding and agreed to follow-up with the PCP or specialist.   Delos Haring, PA-C 09/02/14 2153  Dorie Rank, MD 09/02/14 2159  Delos Haring, PA-C 09/17/14 0914  Delos Haring, PA-C 09/18/14 2138  Dorie Rank, MD 09/19/14 [phone]

## 2014-09-10 ENCOUNTER — Encounter (HOSPITAL_COMMUNITY): Payer: Self-pay | Admitting: Cardiology

## 2014-09-10 ENCOUNTER — Emergency Department (HOSPITAL_COMMUNITY)
Admission: EM | Admit: 2014-09-10 | Discharge: 2014-09-10 | Disposition: A | Discharge status: Home / Self Care | Payer: Medicare Other | Attending: Emergency Medicine | Admitting: Emergency Medicine

## 2014-09-10 DIAGNOSIS — N189 Chronic kidney disease, unspecified: Secondary | ICD-10-CM | POA: Diagnosis not present

## 2014-09-10 DIAGNOSIS — Z8739 Personal history of other diseases of the musculoskeletal system and connective tissue: Secondary | ICD-10-CM | POA: Insufficient documentation

## 2014-09-10 DIAGNOSIS — F419 Anxiety disorder, unspecified: Secondary | ICD-10-CM | POA: Diagnosis not present

## 2014-09-10 DIAGNOSIS — I129 Hypertensive chronic kidney disease with stage 1 through stage 4 chronic kidney disease, or unspecified chronic kidney disease: Secondary | ICD-10-CM | POA: Insufficient documentation

## 2014-09-10 DIAGNOSIS — K644 Residual hemorrhoidal skin tags: Secondary | ICD-10-CM | POA: Diagnosis not present

## 2014-09-10 DIAGNOSIS — G8929 Other chronic pain: Secondary | ICD-10-CM | POA: Insufficient documentation

## 2014-09-10 DIAGNOSIS — Z720 Tobacco use: Secondary | ICD-10-CM | POA: Insufficient documentation

## 2014-09-10 DIAGNOSIS — Z9119 Patient's noncompliance with other medical treatment and regimen: Secondary | ICD-10-CM | POA: Diagnosis not present

## 2014-09-10 DIAGNOSIS — Z8709 Personal history of other diseases of the respiratory system: Secondary | ICD-10-CM | POA: Diagnosis not present

## 2014-09-10 DIAGNOSIS — E785 Hyperlipidemia, unspecified: Secondary | ICD-10-CM | POA: Diagnosis not present

## 2014-09-10 DIAGNOSIS — Z79899 Other long term (current) drug therapy: Secondary | ICD-10-CM | POA: Insufficient documentation

## 2014-09-10 DIAGNOSIS — Z8701 Personal history of pneumonia (recurrent): Secondary | ICD-10-CM | POA: Diagnosis not present

## 2014-09-10 DIAGNOSIS — K6289 Other specified diseases of anus and rectum: Secondary | ICD-10-CM | POA: Diagnosis present

## 2014-09-10 DIAGNOSIS — I5042 Chronic combined systolic (congestive) and diastolic (congestive) heart failure: Secondary | ICD-10-CM | POA: Insufficient documentation

## 2014-09-10 MED ORDER — LIDOCAINE HCL 2 % EX GEL: 1.0000 "application " | CUTANEOUS | Status: DC | PRN

## 2014-09-10 MED ORDER — MORPHINE SULFATE 4 MG/ML IJ SOLN
4.0000 mg | Freq: Once | INTRAMUSCULAR | Status: AC
  Administered 2014-09-10: 4 mg via INTRAMUSCULAR
  Filled 2014-09-10: qty 1

## 2014-09-10 MED ORDER — LIDOCAINE HCL 2 % EX GEL
1.0000 "application " | Freq: Once | CUTANEOUS | Status: AC
  Administered 2014-09-10: 1 via TOPICAL
  Filled 2014-09-10: qty 20

## 2014-09-10 MED ORDER — OXYCODONE-ACETAMINOPHEN 5-325 MG PO TABS: 2.0000 | ORAL_TABLET | ORAL | Status: DC | PRN

## 2014-09-10 NOTE — Discharge Instructions (Signed)
Hemorrhoids Follow-up with GI. Hemorrhoids are swollen veins around the rectum or anus. There are two types of hemorrhoids:   Internal hemorrhoids. These occur in the veins just inside the rectum. They may poke through to the outside and become irritated and painful.  External hemorrhoids. These occur in the veins outside the anus and can be felt as a painful swelling or hard lump near the anus. CAUSES  Pregnancy.   Obesity.   Constipation or diarrhea.   Straining to have a bowel movement.   Sitting for long periods on the toilet.  Heavy lifting or other activity that caused you to strain.  Anal intercourse. SYMPTOMS   Pain.   Anal itching or irritation.   Rectal bleeding.   Fecal leakage.   Anal swelling.   One or more lumps around the anus.  DIAGNOSIS  Your caregiver may be able to diagnose hemorrhoids by visual examination. Other examinations or tests that may be performed include:   Examination of the rectal area with a gloved hand (digital rectal exam).   Examination of anal canal using a small tube (scope).   A blood test if you have lost a significant amount of blood.  A test to look inside the colon (sigmoidoscopy or colonoscopy). TREATMENT Most hemorrhoids can be treated at home. However, if symptoms do not seem to be getting better or if you have a lot of rectal bleeding, your caregiver may perform a procedure to help make the hemorrhoids get smaller or remove them completely. Possible treatments include:   Placing a rubber band at the base of the hemorrhoid to cut off the circulation (rubber band ligation).   Injecting a chemical to shrink the hemorrhoid (sclerotherapy).   Using a tool to burn the hemorrhoid (infrared light therapy).   Surgically removing the hemorrhoid (hemorrhoidectomy).   Stapling the hemorrhoid to block blood flow to the tissue (hemorrhoid stapling).  HOME CARE INSTRUCTIONS   Eat foods with fiber, such as  whole grains, beans, nuts, fruits, and vegetables. Ask your doctor about taking products with added fiber in them (fibersupplements).  Increase fluid intake. Drink enough water and fluids to keep your urine clear or pale yellow.   Exercise regularly.   Go to the bathroom when you have the urge to have a bowel movement. Do not wait.   Avoid straining to have bowel movements.   Keep the anal area dry and clean. Use wet toilet paper or moist towelettes after a bowel movement.   Medicated creams and suppositories may be used or applied as directed.   Only take over-the-counter or prescription medicines as directed by your caregiver.   Take warm sitz baths for 15-20 minutes, 3-4 times a day to ease pain and discomfort.   Place ice packs on the hemorrhoids if they are tender and swollen. Using ice packs between sitz baths may be helpful.   Put ice in a plastic bag.   Place a towel between your skin and the bag.   Leave the ice on for 15-20 minutes, 3-4 times a day.   Do not use a donut-shaped pillow or sit on the toilet for long periods. This increases blood pooling and pain.  SEEK MEDICAL CARE IF:  You have increasing pain and swelling that is not controlled by treatment or medicine.  You have uncontrolled bleeding.  You have difficulty or you are unable to have a bowel movement.  You have pain or inflammation outside the area of the hemorrhoids. MAKE SURE YOU:  Understand these instructions.  Will watch your condition.  Will get help right away if you are not doing well or get worse. Document Released: 02/28/2000 Document Revised: 02/17/2012 Document Reviewed: 01/05/2012 [facility] Patient Information 2015 [place], [place]. This information is not intended to replace advice given to you by your health care provider. Make sure you discuss any questions you have with your health care provider.

## 2014-09-10 NOTE — ED Notes (Signed)
Pt reports rectal pain from hemorrhoids and rectal bleeding. Also reports some groin pain. No urinary symptoms

## 2014-09-10 NOTE — ED Provider Notes (Signed)
CSN: [id number]     Arrival date & time 09/10/14  1342 History   First MD Initiated Contact with Patient 09/10/14 1810     Chief Complaint  Patient presents with  . Rectal Pain     (Consider location/radiation/quality/duration/timing/severity/associated sxs/prior Treatment) The history is provided by the patient. No language interpreter was used.   [name] is a 44 year old male with a history of hypertension, anxiety, chronic renal failure, CHF, cardiomyopathy, hyperlipidemia, alcohol abuse who presents for rectal pain and hemorrhoids for the past 3 days. He states he is normally able to handle the pain with over-the-counter hemorrhoid cream but has not been able to sleep for the past 2 nights. He states he had several flareups in the past and was seen by [facility] on 07/23/2014 for evaluation of rectal bleeding. He states at that time cardiology was supposed to coordinate with GI in order for him to have the hemorrhoids removed. He states this is been pushed back since he has cardiac surgery scheduled for 10/03/2014. He denies any fever, chills, abdominal pain, nausea, vomiting, diarrhea, constipation.   Past Medical History  Diagnosis Date  . Hypertension     a. Hx of HTN urgency secondary to noncompliance. b. urinary metanephrine and catecholeamine levels normal 2013.  [name] Anxiety   . GERD (gastroesophageal reflux disease)   . Acute renal failure     June 2012 felt secondary to Toradol  . Chronic renal failure   . Chronic combined systolic and diastolic congestive heart failure     a. 05/2011: Adm with pulm edema/HTN urgency, EF 35-40% with diffuse hypokinesis and moderate to severe mitral regurgitation. Cardiomyopathy likely due to uncontrolled HTN and ETOH abuse - cath deferred due to renal insufficiency (felt due to uncontrolled HTN). bJodie Echevaria MV 06/2011: EF 37% and no ischemia or infarction. c. EF 45-50% by echo 01/2012.  . Cardiomyopathy     non-ischemic - probably related to  untreated HTN and ETOH abuse  . HYPERLIPIDEMIA   . INGUINAL HERNIA   . ETOH abuse     a. Reported to have quit 05/2011.  . Valvular heart disease     a. Echo 05/2011: moderate to severe eccentric MR and mild to moderate AI with prolapsing left coronary cusp. b. Echo 01/2012: mild-mod AI, mild dilitation of aortic root, mild MR.;  b. 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 CKD (chronic kidney disease)     a. 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  peak creatinine 3.46 during admx for aortic dissection 2/16  . Tobacco abuse   . Pneumonia ~ 2013  . Headache(784.0)     "q other day" (08/08/2013)  . Chronic sinusitis   . History of medication noncompliance   . Chronic abdominal pain   . DDD (degenerative disc disease), lumbar   . Aortic dissection     a. admx 04/2014 >> L renal infarct; a/c renal failure >> VVS to re-eval 05/2014 to decide +/- surgery   Past Surgical History  Procedure Laterality Date  . Ankle surgery      Fractures bilaterally  . Inguinal hernia repair Right ~ 1996  . Foot fracture surgery Bilateral 2004-2010    "got pins in both of them"  . Left heart catheterization with coronary angiogram N/A 06/21/2014    Procedure: LEFT HEART CATHETERIZATION WITH CORONARY ANGIOGRAM;  Surgeon: Larey Dresser, MD;  Location: [facility]  CATH LAB;  Service: Cardiovascular;  Laterality: N/A;   Family History  Problem Relation Age of Onset  . Hypertension    . Colon cancer Paternal Uncle   . Stroke Maternal Aunt   . Heart attack Brother   . Diabetes Maternal Aunt   . Throat cancer Neg Hx   . Pancreatic cancer Neg Hx   . Esophageal cancer Neg Hx   . Kidney disease Neg Hx   . Liver disease Neg Hx    History  Substance Use Topics  . Smoking status: Current Every Day Smoker -- 0.25 packs/day for 23 years    Types: Cigarettes    Last Attempt to Quit: 05/03/2014  . Smokeless tobacco: Former Systems developer     Types: Chew     Comment: as of 05-22-14 down to 3-4 cigs daily  . Alcohol Use: No     Comment: + previous use    Review of Systems  Constitutional: Negative for fever and chills.  Gastrointestinal: Positive for rectal pain.  All other systems reviewed and are negative.     Allergies  Imdur  Home Medications   Prior to Admission medications   Medication Sig Start Date End Date Taking? Authorizing Provider  acetaminophen (TYLENOL) 500 MG tablet Take 1,500 mg by mouth 2 (two) times daily as needed for moderate pain.   Yes Historical Provider, MD  amLODipine (NORVASC) 10 MG tablet Take 1 tablet by mouth daily. 06/18/14  Yes Historical Provider, MD  atorvastatin (LIPITOR) 20 MG tablet Take 20 mg by mouth daily.   Yes Historical Provider, MD  cloNIDine (CATAPRES) 0.2 MG tablet Take 1 tablet (0.2 mg total) by mouth 3 (three) times daily. 05/03/14  Yes Orson Eva, MD  hydrALAZINE (APRESOLINE) 100 MG tablet Take 1 tablet (100 mg total) by mouth 3 (three) times daily. Patient taking differently: Take 200 mg by mouth 3 (three) times daily.  05/03/14  Yes Orson Eva, MD  hydrocortisone (ANUSOL-HC) 25 MG suppository Use 1 suppository every night at bedtime. 07/23/14  Yes Amy S Esterwood, PA-C  labetalol (NORMODYNE) 200 MG tablet Take 2 tablets (400 mg total) by mouth daily. 06/15/14  Yes Scott Joylene Draft, PA-C  minoxidil (LONITEN) 2.5 MG tablet Take 1 tablet (2.5 mg total) by mouth 2 (two) times daily. 06/15/14  Yes Scott Joylene Draft, PA-C  nitroGLYCERIN (NITRODUR - DOSED IN MG/24 HR) 0.4 mg/hr patch Place 1 patch (0.4 mg total) onto the skin daily. 05/03/14  Yes Orson Eva, MD  spironolactone (ALDACTONE) 25 MG tablet 1/2 tablet (12.5mg ) daily Patient taking differently: 12.5 mg. 1/2 tablet (12.5mg ) daily 08/02/14  Yes Larey Dresser, MD  lidocaine (XYLOCAINE) 2 % jelly Apply 1 application topically as needed. For external hemorrhoids 09/10/14   Ottie Glazier, PA-C  oxyCODONE-acetaminophen (PERCOCET/ROXICET)  5-325 MG per tablet Take 2 tablets by mouth every 4 (four) hours as needed for severe pain. 09/10/14   Leemon Ayala Patel-Mills, PA-C   BP 109/95 mmHg  Pulse 63  Temp(Src) 98.6 F (37 C)  Resp 18  Ht 5\' 11"  (1.803 m)  Wt 152 lb (68.947 kg)  BMI 21.21 kg/m2  SpO2 100% Physical Exam  Constitutional: He is oriented to person, place, and time. He appears well-developed and well-nourished.  HENT:  Head: Normocephalic and atraumatic.  Eyes: Conjunctivae are normal.  Neck: Normal range of motion. Neck supple.  Cardiovascular: Normal rate, regular rhythm and normal heart sounds.   Pulmonary/Chest: Effort normal and breath sounds normal.  Abdominal: Soft. There is no tenderness.  Genitourinary:  Rectal exam: Chaperone present. Nonthrombosed external prolapsed hemorrhoid at the 3oclock position. Hemorrhoid is not blue in appearance and there is no clot formation.  The hemorrhoid is soft and not indurated.   Musculoskeletal: Normal range of motion.  Neurological: He is alert and oriented to person, place, and time.  Skin: Skin is warm and dry.  Psychiatric: He has a normal mood and affect. His behavior is normal.  Nursing note and vitals reviewed.   ED Course  Procedures (including critical care time) Labs Review Labs Reviewed - No data to display  Imaging Review No results found.   EKG Interpretation None      MDM   Final diagnoses:  External hemorrhoids without complication   Patient is in moderate pain. His vitals are stable. He has a large external, non thrombosed hemorrhoid that is not actively bleeding.  Medications  morphine 4 MG/ML injection 4 mg (4 mg Intramuscular Given 09/10/14 1942)  lidocaine (XYLOCAINE) 2 % jelly 1 application (1 application Topical Given 09/10/14 1943)  morphine 4 MG/ML injection 4 mg (4 mg Intramuscular Given 09/10/14 2040)  I prescribed the patient Percocet and lidocaine 2% jelly for hemorrhoid relief until he follows up with GI.      Ottie Glazier, PA-C 09/11/14 0007  Sherwood Gambler, MD 09/11/14 [phone]

## 2014-09-11 ENCOUNTER — Telehealth: Payer: Self-pay | Admitting: Physician Assistant

## 2014-09-11 NOTE — Telephone Encounter (Signed)
Discussed with the patient. He is doing some of these things. Seems to have a better understanding of the plan. Discussed in detail how to care for himself. He is having trouble sleeping with the pain. He would like to have pain medicine for the night.

## 2014-09-11 NOTE — Telephone Encounter (Signed)
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 When I saw him- explained that No one would do surgery on a hemorrhoid or a colonoscopy until after his aneurysm is fixed. iam sorry he is having hemorrhoid pain bit his other health problems are much more serious- he should continue Lidocaine Jelly, 3-4 x daily, and Anusol HC cream . Pain meds will make him constipated , would prefer he not take for hemorrhoids- tubs soaks, are good- if he really needs pain med can give #20 percocet, he needs to try to be patient until aneurysm fixed , then come in for appt to schedule colonoscopy

## 2014-09-11 NOTE — Telephone Encounter (Signed)
He went to the ER for hemorrhoid pain. He was given Percocet and lidocaine 2% jelly. He was told to follow up with GI. Nonthrombosed external prolapsed hemorrhoid at the 3oclock position. Hemorrhoid is not blue in appearance and there is no clot formation. The hemorrhoid is soft and not indurated. When speaking with the patient, it seems he really does not understand what he is supposed to do to get this taken care of. He feels he is getting the "run around." The Anusol suppositories helped a little. Please advise. He is upset.

## 2014-09-12 ENCOUNTER — Other Ambulatory Visit: Payer: Self-pay

## 2014-09-12 MED ORDER — HYDROCORTISONE 2.5 % RE CREA: TOPICAL_CREAM | RECTAL | Status: DC

## 2014-09-12 MED ORDER — TRAMADOL HCL 50 MG PO TABS: ORAL_TABLET | ORAL | Status: DC

## 2014-09-12 NOTE — Telephone Encounter (Signed)
Patient called back - demanded to speak with Tammy regarding his medication refill; states he wants it filled right now.  Advised patient that there was not a Tammy here, but it looked like Beth and spoke with him.  I offered to send message to [facility] to return his call, but he began to yell that he is getting "the run around" and wants this done "TODAY".  I told patient I will have Beth return his call, but he was yelling something as he disconnected the call.

## 2014-09-12 NOTE — Telephone Encounter (Signed)
See the previous note

## 2014-09-12 NOTE — Telephone Encounter (Signed)
Spoke with Amy [place]. Patient will use Lidocaine gel, Anusol HC cream daytime. At bedtime he is to use Anusol Suppository. He will continue with tub soaks, wet wipes and loose under garment. Avoid constipation. He may use Tramadol at hs for pain. Patient expresses understanding.

## 2014-09-13 NOTE — Progress Notes (Signed)
Cardiology Office Note   Date:  09/14/2014   ID:  [name], DOB Dec 15, 1970, MRN [id number]  PCP:  [name], MD  Cardiologist:  Dr. [name]     Chief Complaint  Patient presents with  . Follow-up    Hypertension  . Coronary Artery Disease     History of Present Illness: [name] is a 44 y.o. male with a hx of HTN, NICM likely related to HTN heart disease, tobacco and ETOH abuse, CKD, valvular heart disease. Admitted in 05/2011 with hypertensive emergency c/b pulmonary edema. He was diuresed. Echo demonstrated EF 35-40% with diff HK and mod to severe MR. DCM was felt to be due to HTN and ETOH abuse. LHC was not done due to CKD. Myoview was negative for ischemia or scar. Echo in 02/2013 demonstrated EF 45-50% and mod AI and mild MR.   Admitted 04/2014 with type B aortic dissection secondary to hypertensive emergency. Dissection arose just distal to the margin of the left subclavian and extended into the abdominal aorta below the level of the renal arteries. There was involvement of the left renal artery with infarction and resultant acute on chronic renal failure (peak creatinine 3.46) . He was followed by critical care, vascular surgery and nephrology.    He has seen Dr. Oneida Alar and Dr. Servando Snare.  It appears that he will need repair of the arch and descending thoracic aorta. He has been referred to [facility] for opinion on arch/descending thoracic aorta surgical replacement versus stent versus observation. LHC in 06/2014 demonstrated nonobstructive CAD with ostial D1 60% lesion.  Echo 06/2014 demonstrated EF 55-60%, moderate AI, moderate MR.   Last seen by Dr. Aundra Dubin 08/02/14.  Spironolactone was added for more aggressive control of blood pressure given history of aortic dissection and probable hypertensive cardiomyopathy. He returns for follow-up.     Studies/Reports Reviewed Today:  Echo 06/29/14 Severe LVH, EF 55-60% Mod AI Mild LAE Trivial  effusion Impressions:- Known type B disection. Communication between true and falselumens. Suprasternal images suggest that the disection plane maypropagate to at least left subclavian take off. Root above aorticvalve ok. He apparently is seeing Dr Servando Snare at this time anddissection is known Suggest f/u MRI or CTA to r/o propagationthrough arch.  LHC 06/21/14 LM: No significant disease.  LAD:  D1 with 60% ostial stenosis. Luminal irregularities in the remainder of the LAD.  LCx: Luminal irregularities.  RCA: Luminal irregularities  Renal Artery [name] 03/28/14 No evidence of renal artery stenosis noted bilaterally.  Myoview 06/2011 No evidence of ischemia or infarction. Suspect nonischemic cardiomyopathy.  LV Ejection Fraction: 37%   Past Medical History  Diagnosis Date  . Hypertension     a. Hx of HTN urgency secondary to noncompliance. b. urinary metanephrine and catecholeamine levels normal 2013.  [name] Anxiety   . GERD (gastroesophageal reflux disease)   . Acute renal failure     June 2012 felt secondary to Toradol  . Chronic renal failure   . Chronic combined systolic and diastolic congestive heart failure     a. 05/2011: Adm with pulm edema/HTN urgency, EF 35-40% with diffuse hypokinesis and moderate to severe mitral regurgitation. Cardiomyopathy likely due to uncontrolled HTN and ETOH abuse - cath deferred due to renal insufficiency (felt due to uncontrolled HTN). bJodie Echevaria MV 06/2011: EF 37% and no ischemia or infarction. c. EF 45-50% by echo 01/2012.  . Cardiomyopathy     non-ischemic - probably related to untreated HTN and ETOH abuse  . HYPERLIPIDEMIA   .  INGUINAL HERNIA   . ETOH abuse     a. Reported to have quit 05/2011.  . Valvular heart disease     a. Echo 05/2011: moderate to severe eccentric MR and mild to moderate AI with prolapsing left coronary cusp. b. Echo 01/2012: mild-mod AI, mild dilitation of aortic root, mild MR.;  b. Echo 1/16: Severe LVH consistent  with hypertrophic cardio myopathy, EF 50%, no RWMA, mod AI, mild MR, mild RAE, dilated Ao root (40 mm)   . CKD (chronic kidney disease)     a. 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  peak creatinine 3.46 during admx for aortic dissection 2/16  . Tobacco abuse   . Pneumonia ~ 2013  . Headache(784.0)     "q other day" (08/08/2013)  . Chronic sinusitis   . History of medication noncompliance   . Chronic abdominal pain   . DDD (degenerative disc disease), lumbar   . Aortic dissection     a. admx 04/2014 >> L renal infarct; a/c renal failure >> VVS to re-eval 05/2014 to decide +/- surgery  1. H/o ankle fractures bilaterally 2. GERD 3. HTN: Negative urinary catecholeamine collection for pheochromocytoma.  4. CKD: Suspect hypertensive nephropathy.  5. Cardiomyopathy: Most likely due to heavy ETOH ingestion and uncontrolled HTN. 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 Lexiscan myoview (4/13): EF 37%, global hypokinesis, no ischemic or infarction. Echo (12/14) with EF 45-50%, diffuse hypokinesis, moderate LVH, mild MR, moderate AI. Echo (4/16) with EF 55-60%, severe LVH, mild LV dilation, moderate AI and moderate MR.  6. ETOH abuse: Quit 3/13.  7. Active smoker. 8. Valvular heart disease: Echo in 3/13 showed moderate to severe eccentric MR and mild to moderate AI with prolapsing left coronary cusp. Echo 12/14 with mild MR and moderate AI. Echo 4/16 with moderate AI and moderate MR.  9. Headache with Imdur.  10. Type B aortic dissection: Occurred in 2/16, from just distal to the left subclavian. 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 Descending thoracic aorta size 5.1 cm with ascending aorta 4.4 cm when most recently measured.  11. CAD: LHC (4/16) with 60% ostial D1.   Past Surgical History  Procedure Laterality Date  . Ankle surgery       Fractures bilaterally  . Inguinal hernia repair Right ~ 1996  . Foot fracture surgery Bilateral 2004-2010    "got pins in both of them"  . Left heart catheterization with coronary angiogram N/A 06/21/2014    Procedure: LEFT HEART CATHETERIZATION WITH CORONARY ANGIOGRAM;  Surgeon: Larey Dresser, MD;  Location: [facility] CATH LAB;  Service: Cardiovascular;  Laterality: N/A;     Current Outpatient Prescriptions  Medication Sig Dispense Refill  . acetaminophen (TYLENOL) 500 MG tablet Take 1,500 mg by mouth 2 (two) times daily as needed for moderate pain.    [name] amLODipine (NORVASC) 10 MG tablet Take 1 tablet by mouth daily.    [name] atorvastatin (LIPITOR) 20 MG tablet Take 20 mg by mouth daily.    . cloNIDine (CATAPRES) 0.2 MG tablet Take 1 tablet (0.2 mg total) by mouth 3 (three) times daily. 90 tablet 1  . hydrALAZINE (APRESOLINE) 100 MG tablet Take 1 tablet (100 mg total) by mouth 3 (three) times daily. (Patient taking differently: Take 200 mg by mouth 3 (three) times daily. ) 90 tablet 1  . hydrocortisone (ANUSOL-HC) 2.5 % rectal  cream Apply topically to hemorrhoid twice daily after using the Lidocaine 2% jelly 30 g 1  . hydrocortisone (ANUSOL-HC) 25 MG suppository Use 1 suppository every night at bedtime. 30 suppository 3  . labetalol (NORMODYNE) 200 MG tablet Take 2 tablets (400 mg total) by mouth daily. 60 tablet 11  . lidocaine (XYLOCAINE) 2 % jelly Apply 1 application topically as needed. For external hemorrhoids 30 mL 0  . minoxidil (LONITEN) 2.5 MG tablet Take 1 tablet (2.5 mg total) by mouth 2 (two) times daily. 60 tablet 3  . nitroGLYCERIN (NITRODUR - DOSED IN MG/24 HR) 0.4 mg/hr patch Place 1 patch (0.4 mg total) onto the skin daily. 30 patch 1  . oxyCODONE-acetaminophen (PERCOCET/ROXICET) 5-325 MG per tablet Take 2 tablets by mouth every 4 (four) hours as needed for severe pain. 8 tablet 0  . spironolactone (ALDACTONE) 25 MG tablet 1/2 tablet (12.5mg ) daily (Patient taking differently: 12.5 mg.  1/2 tablet (12.5mg ) daily) 45 tablet 1  . traMADol (ULTRAM) 50 MG tablet Take 1 tablet at bedtime PRN pain 40 tablet 0   No current facility-administered medications for this visit.    Allergies:   Imdur    Social History:  The patient  reports that he has been smoking Cigarettes.  He has a 5.75 pack-year smoking history. He has quit using smokeless tobacco. His smokeless tobacco use included Chew. He reports that he uses illicit drugs (Marijuana). He reports that he does not drink alcohol.   Family History:  The patient's family history includes Colon cancer in his paternal uncle; Diabetes in his maternal aunt; Heart attack in his brother; Hypertension in an other family member; Stroke in his maternal aunt. There is no history of Throat cancer, Pancreatic cancer, Esophageal cancer, Kidney disease, or Liver disease.    ROS:   Please see the history of present illness.   Review of Systems  All other systems reviewed and are negative.    PHYSICAL EXAM: VS:  There were no vitals taken for this visit.    Wt Readings from Last 3 Encounters:  09/10/14 152 lb (68.947 kg)  08/02/14 149 lb (67.586 kg)  07/23/14 148 lb (67.132 kg)     GEN: Well nourished, well developed, in no acute distress HEENT: normal Neck: no JVD, no carotid bruits, no masses Cardiac:  Normal S1/S2, RRR; no murmur ,  no rubs or gallops, no edema  Respiratory:  clear to auscultation bilaterally, no wheezing, rhonchi or rales. GI: soft, nontender, nondistended, + BS MS: no deformity or atrophy Skin: warm and dry  Neuro:  CNs II-XII intact, Strength and sensation are intact Psych: Normal affect   EKG:  EKG is ordered today.  It demonstrates:      Recent Labs: 03/27/2014: B Natriuretic Peptide 550.7* 04/25/2014: TSH 0.724 04/26/2014: ALT 20 05/03/2014: Magnesium 2.1 09/02/2014: BUN 13; Creatinine, Ser 1.47*; Hemoglobin 11.1*; Platelets 234; Potassium 4.1; Sodium 137    Lipid Panel    Component Value Date/Time    CHOL 182 03/27/2014 2316   TRIG 49 03/27/2014 2316   HDL 57 03/27/2014 2316   CHOLHDL 3.2 03/27/2014 2316   VLDL 10 03/27/2014 2316   LDLCALC 115* 03/27/2014 2316      ASSESSMENT AND PLAN:  Essential hypertension  NICM (nonischemic cardiomyopathy)  CKD (chronic kidney disease), stage 3 (moderate)  Valvular heart disease  Dissecting aneurysm of thoracic aorta, [place]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 Hypertension: Strong  family history of HTN. He has end-organ damage with CKD, type B dissection, and probable hypertensive cardiomyopathy (EF has recovered). SBP 130s-140s at home. Given dissection, would like to see lower.  - Resistant HTN => add spironolactone 12.5 mg daily with BMET in 10 days.  2. Cardiomyopathy: EF recovered to 55-60% on last echo. He is not volume overloaded. Suspect hypertensive +/- ETOH cardiomyopathy.  3. CKD: Hypertensive nephrosclerosis. Needs BP control to try to avoid progression of CKD.  4. Valvular heart disease: Moderate AI, moderate MR. Loud murmurs on exam. Repeat echo in 4/17. 5. Type B aortic dissection: Seeing Dr Servando Snare. Descending thoracic aorta measured 5.1 cm. It appears that he will need repair of the arch and descending thoracic aorta. He has been referred to [facility] for opinion on arch/descending thoracic aorta surgical replacement versus stent versus observation.  6. CAD: Nonobstructive CAD on 4/16 cath. He is on statin and ASA 81. He will need lipids/LFTs checked in 2 months.     Medication Changes: Current medicines are reviewed at length with the patient today.  Concerns regarding medicines are as outlined above.  The following changes have been made:   Discontinued Medications   No medications on file   Modified Medications   No medications on file   New Prescriptions   No medications on file     Labs/ tests ordered today include:    No orders of the defined types were placed in this encounter.     Disposition:   FU with   Signed, Richardson Dopp, PA-C, MHS 09/14/2014 8:06 AM    [facility] [address], [place], [place]  [address] Phone: [phone]; Fax: [phone]    This encounter was created in error - please disregard.

## 2014-09-14 ENCOUNTER — Encounter: Payer: Medicare Other | Admitting: Physician Assistant

## 2014-09-14 HISTORY — PX: AORTIC VALVE SURGERY: SHX549

## 2014-09-21 IMAGING — CT CT ABD-PELV W/O CM
2 of 4 series · 17 of 46 positions shown, 19 images · non-contrast
Comparison: 05/22/2011; 03/23/2010.

CLINICAL DATA: Generalized abd pain and chest pain for 7 years.
Shortness of breath.

CT ABDOMEN AND PELVIS WITHOUT CONTRAST
TECHNIQUE: Multidetector CT imaging of the abdomen and pelvis was
performed following the standard protocol without intravenous
contrast.

[Series 5: routine · axial · 0.68mm/px · z∈[+405,+845]mm · 14 of 96 slices shown, 16 images]
[im 4/96  soft-tissue]
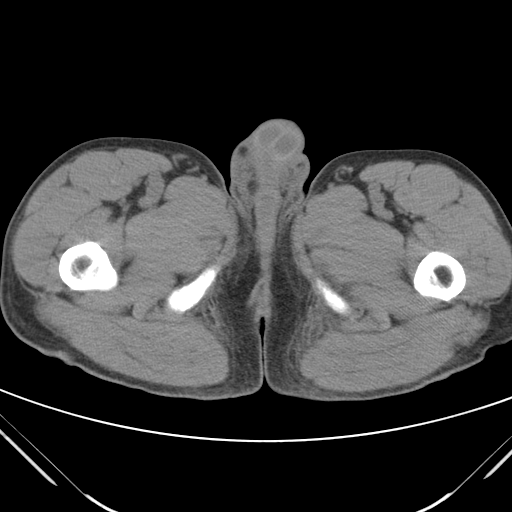
[im 4/96  bone]
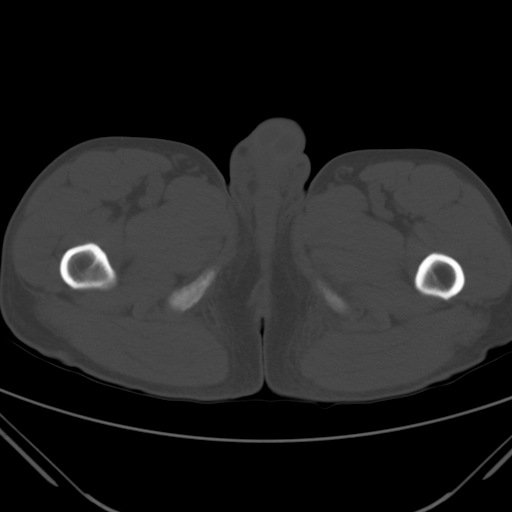
[im 12/96  soft-tissue]
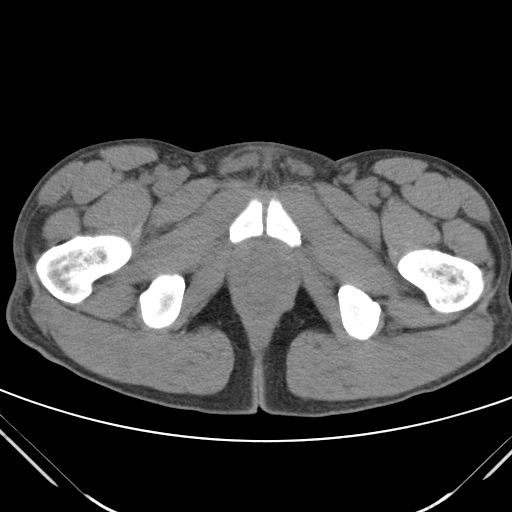
[im 20/96  soft-tissue]
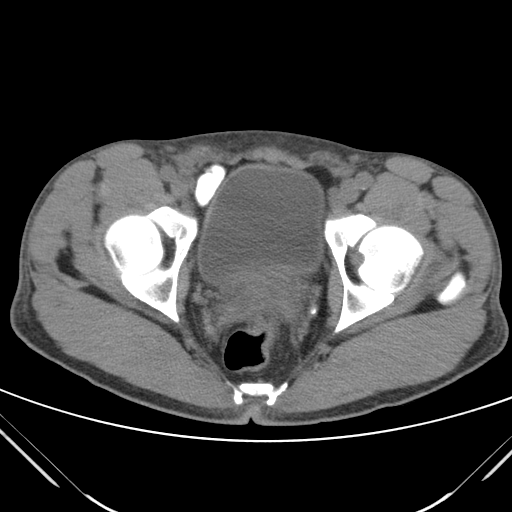
[im 24/96  soft-tissue]
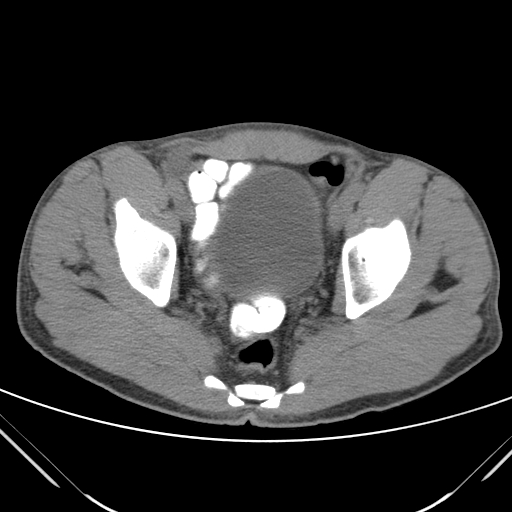
[im 32/96  soft-tissue]
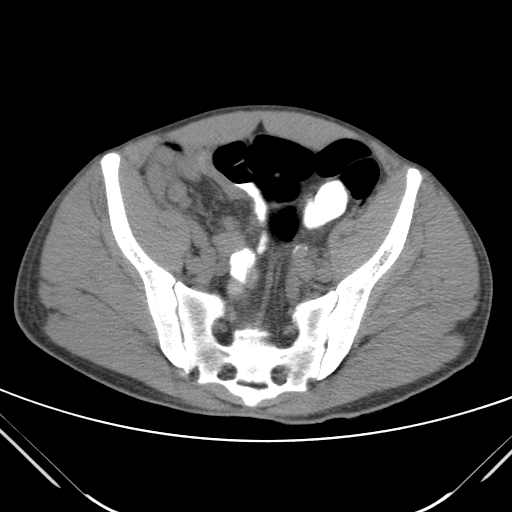
[im 40/96  soft-tissue]
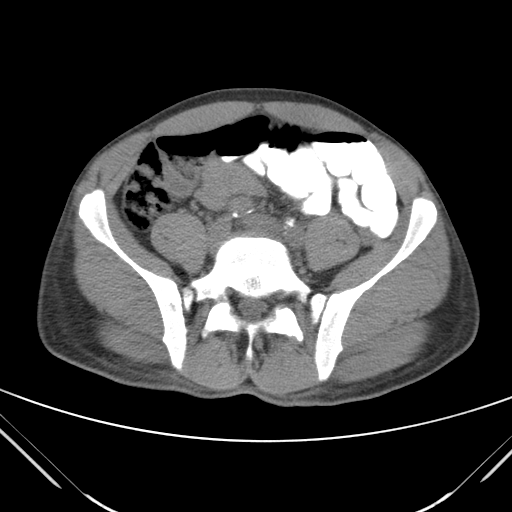
[im 44/96  soft-tissue]
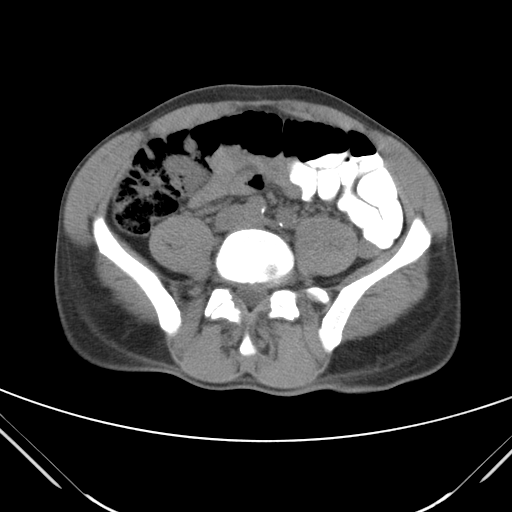
[im 52/96  soft-tissue]
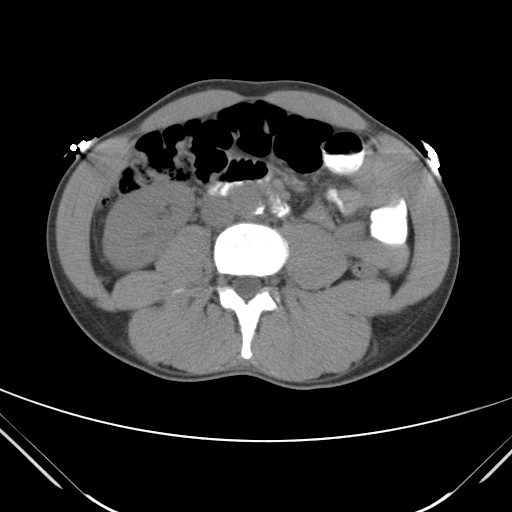
[im 56/96  soft-tissue]
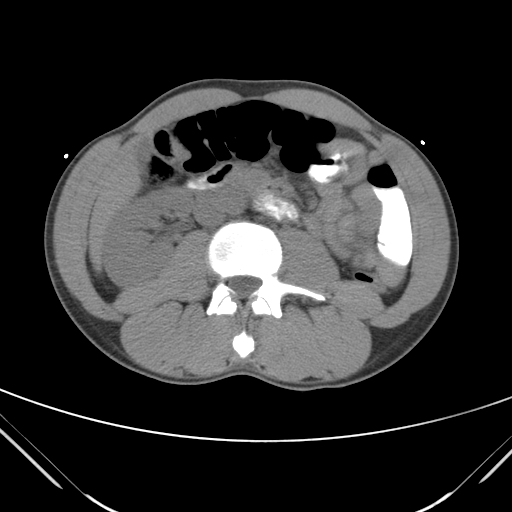
[im 56/96  bone]
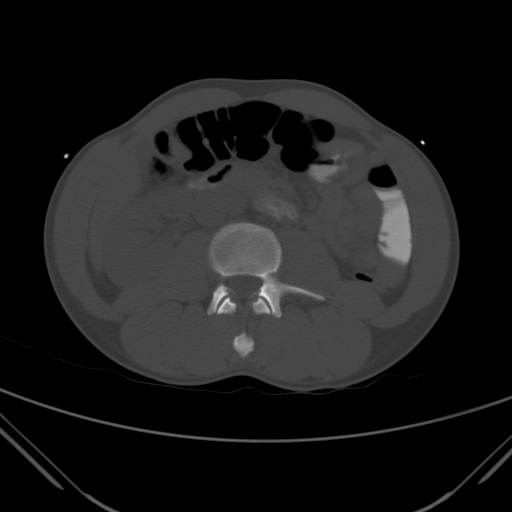
[im 64/96  soft-tissue]
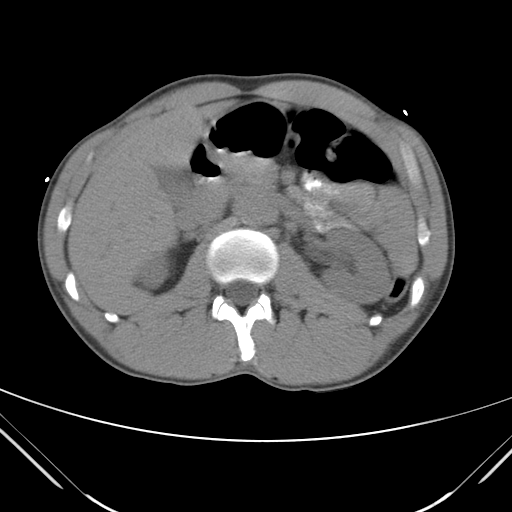
[im 72/96  soft-tissue]
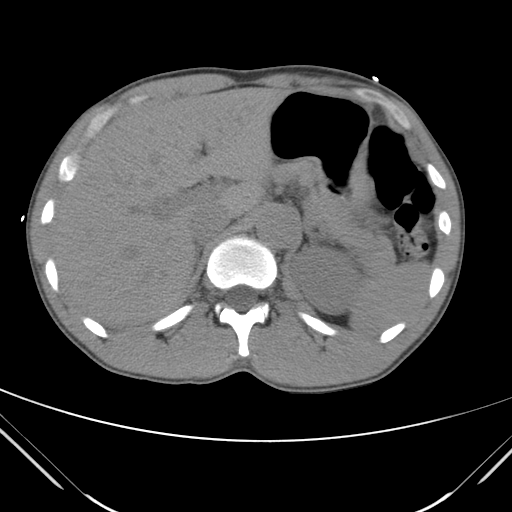
[im 76/96  soft-tissue]
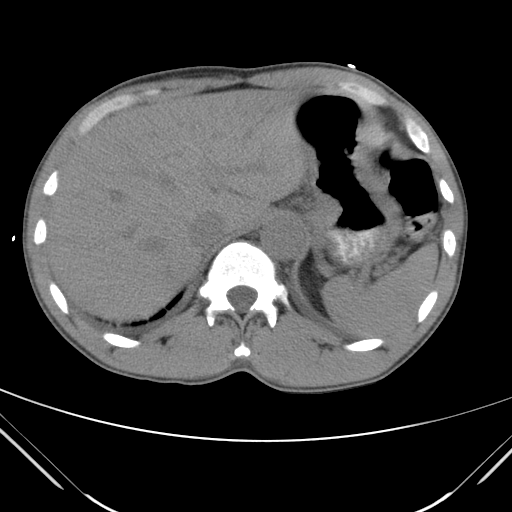
[im 84/96  soft-tissue]
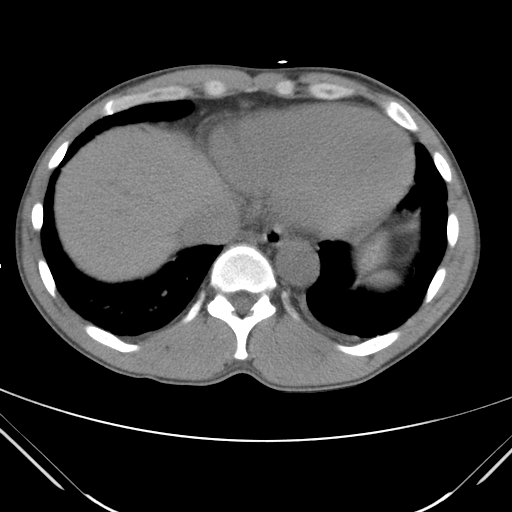
[im 92/96  soft-tissue]
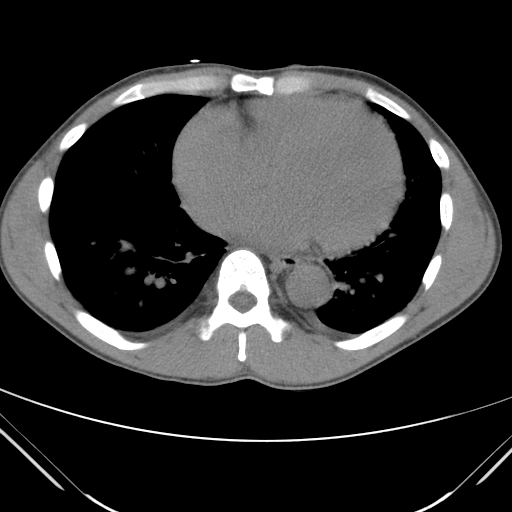

[mpr, coronals, coronal · coronal · 0.93mm/px · 3 of 94 slices shown]
[im 32/94  soft-tissue]
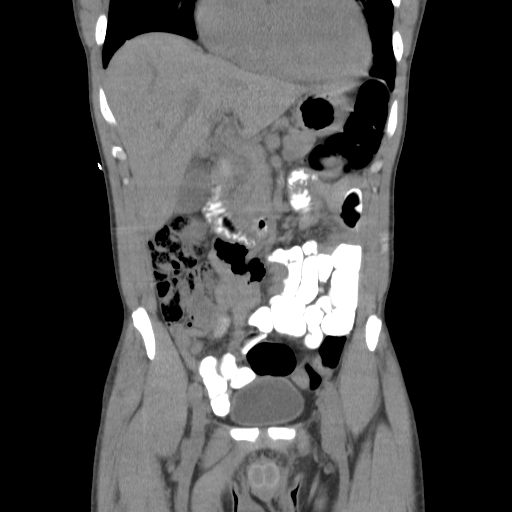
[im 42/94  soft-tissue]
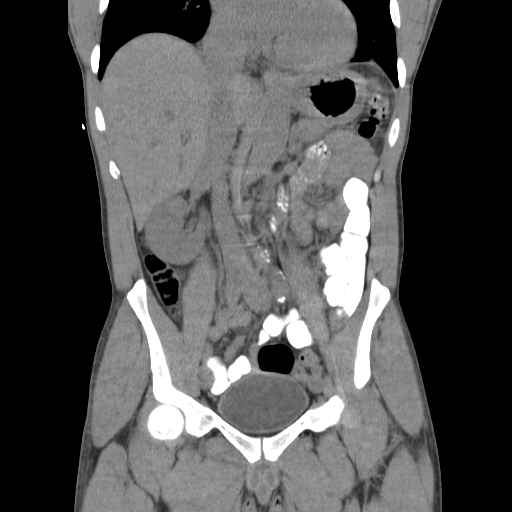
[im 52/94  soft-tissue]
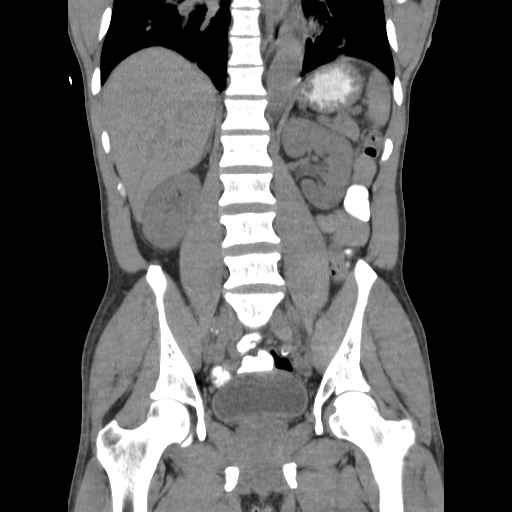

[17 of 46 positions shown; findings below may reference images not displayed]

FINDINGS: Airway thickening noted with interstitial accentuation in
the lung bases favoring interstitial pulmonary edema.  Cardiomegaly
is present along with trace bilateral pleural effusions. The
visualized portion of the liver, spleen, pancreas, and adrenal
glands appear unremarkable in noncontrast CT appearance.

The gallbladder and biliary system appear unremarkable.

The kidneys appear unremarkable, as do the proximal ureters.

No distal ureteral calculus observed.  Urinary bladder
unremarkable.

Orally administered contrast is present in the proximal two-thirds
of the small bowel.  The appendix is well identified on coronal
images and appears normal.  No dilated bowel observed.

Diffuse disc bulge noted at L4-5.
IMPRESSION: 1.  Cardiomegaly and interstitial pulmonary edema.
2.  Disc bulge at L4-5.
3.   Otherwise, no significant abnormality identified.

## 2014-09-21 IMAGING — CR DG CHEST 2V
2 series · 2 of 2 positions shown · non-contrast
Comparison: 05/23/2011

CLINICAL DATA: Shortness of breath.  Abdominal pain.  Cough,
congestion.  History of hypertension history of smoking.

CHEST - 2 VIEW

[w chest pa]
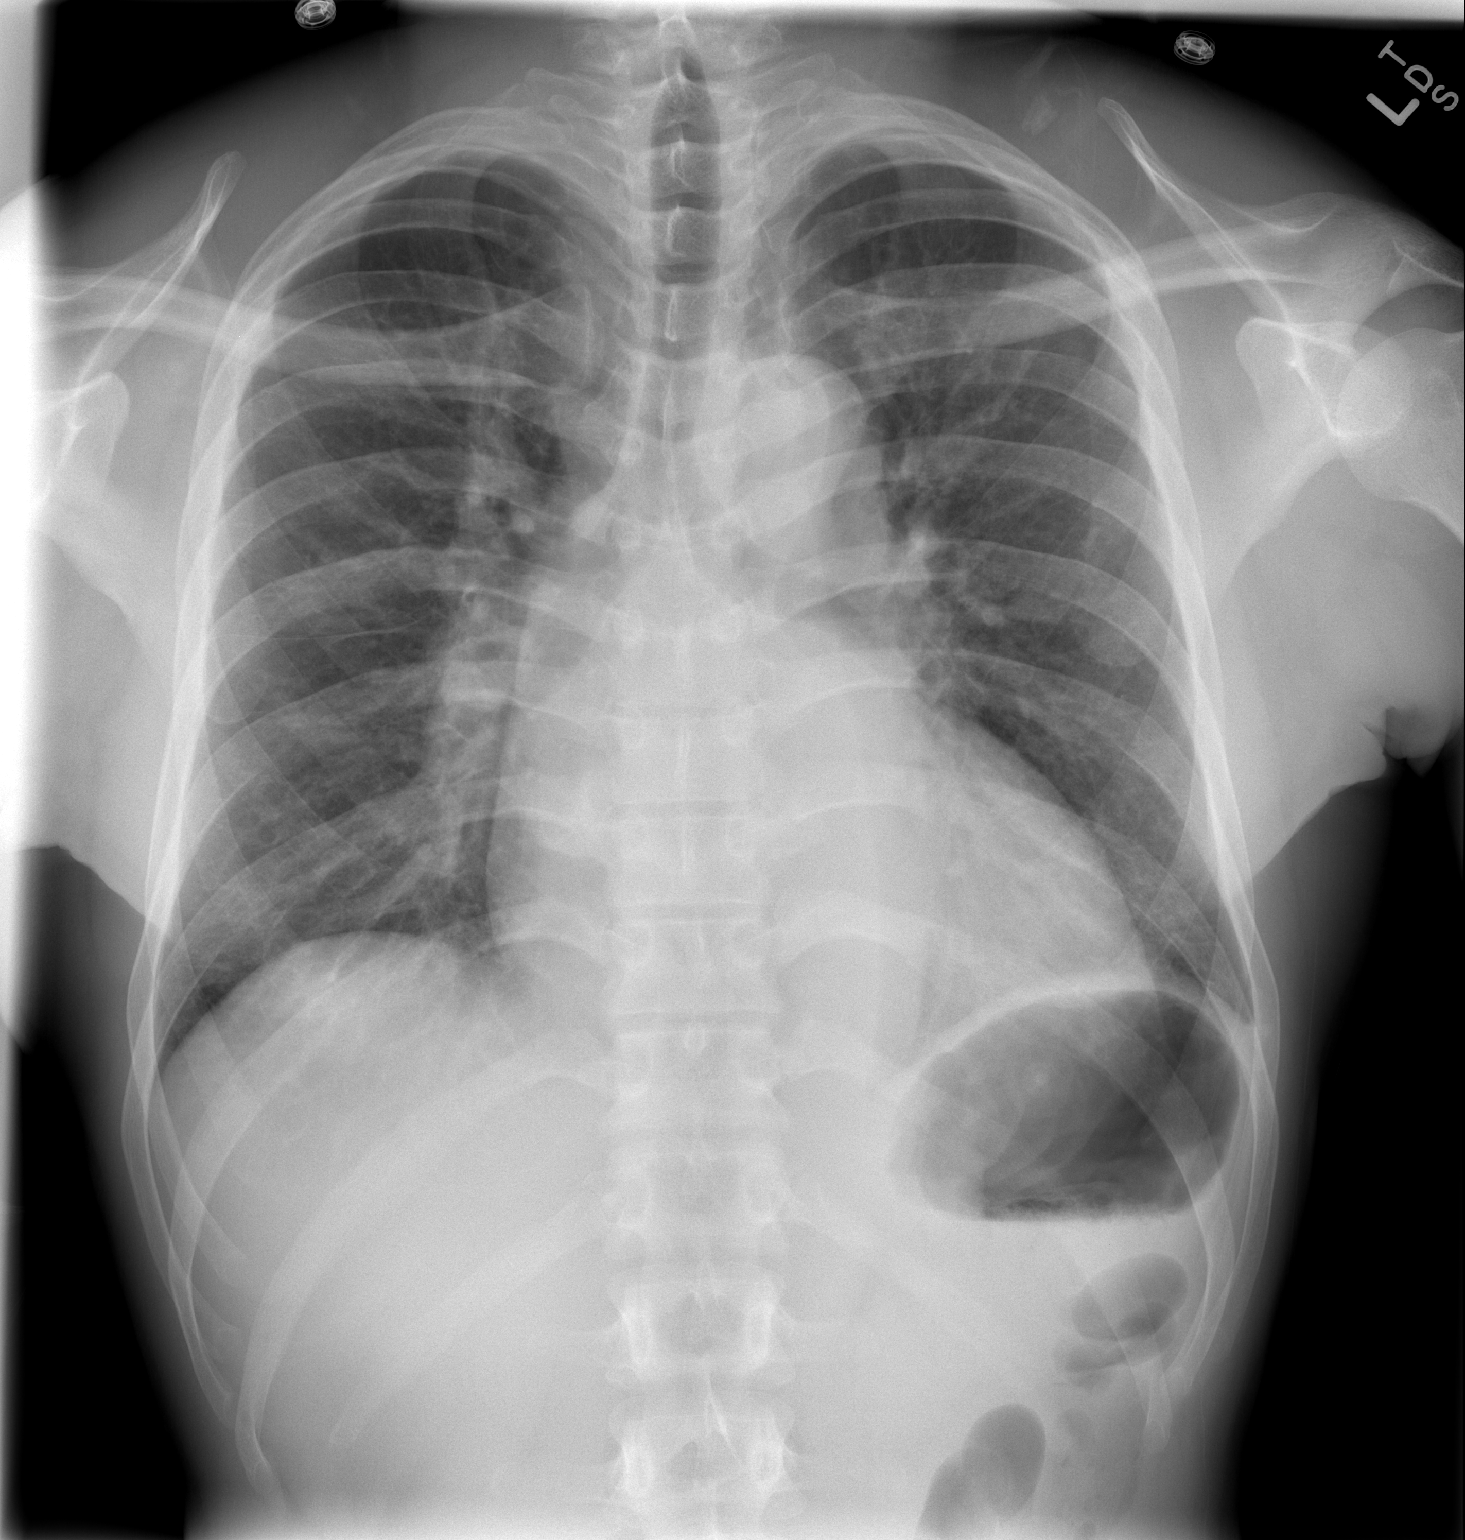

[w chest lat]
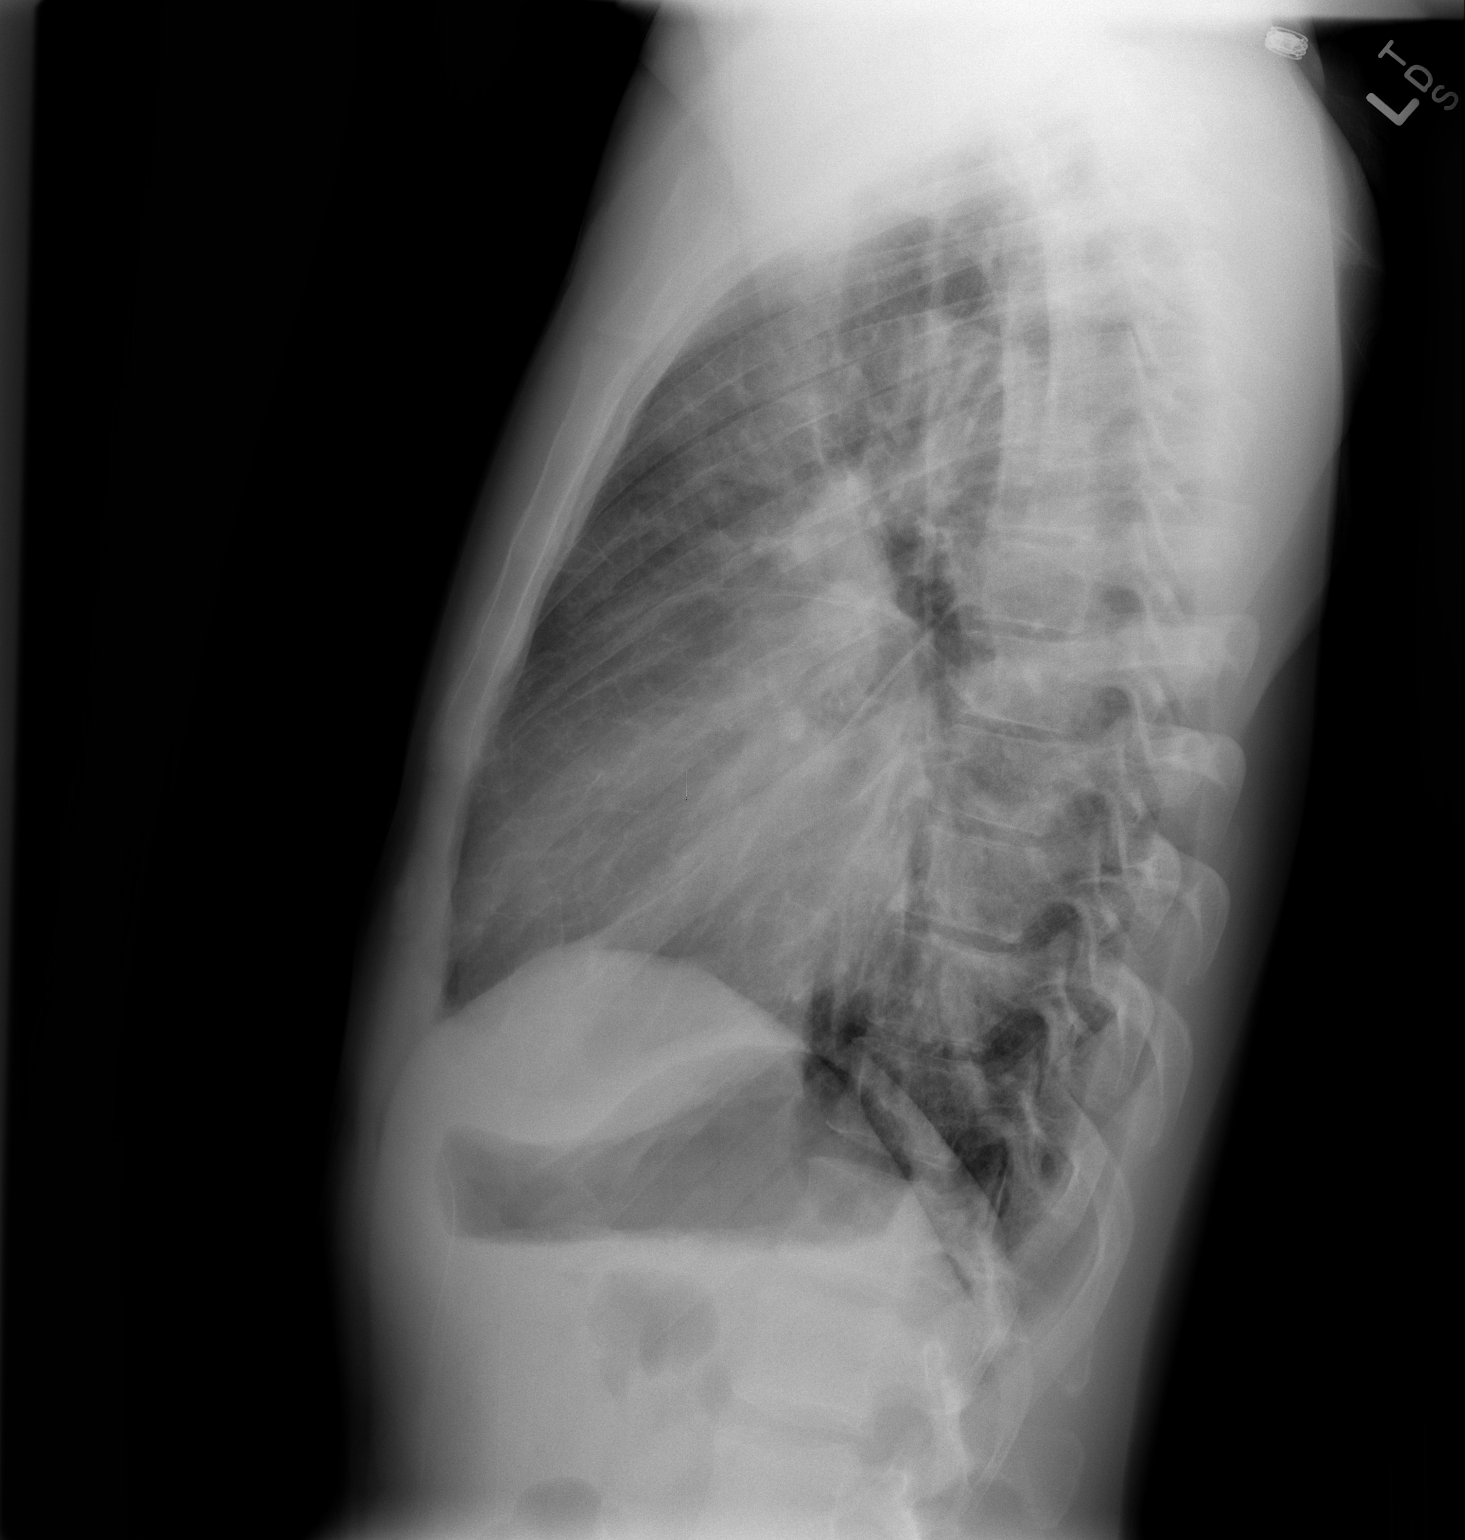

[2 of 2 positions shown; findings below may reference images not displayed]

FINDINGS: Heart is enlarged.  There is mild pulmonary vascular
congestion.  There are no focal consolidations or pleural
effusions. Visualized osseous structures have a normal appearance.
IMPRESSION: Cardiomegaly and vascular congestion.

## 2014-09-27 ENCOUNTER — Emergency Department (HOSPITAL_COMMUNITY)
Admission: EM | Admit: 2014-09-27 | Discharge: 2014-09-27 | Disposition: A | Discharge status: Home / Self Care | Payer: Medicare Other | Attending: Emergency Medicine | Admitting: Emergency Medicine

## 2014-09-27 ENCOUNTER — Encounter (HOSPITAL_COMMUNITY): Payer: Self-pay | Admitting: Neurology

## 2014-09-27 ENCOUNTER — Emergency Department (HOSPITAL_COMMUNITY): Payer: Medicare Other

## 2014-09-27 DIAGNOSIS — Z8701 Personal history of pneumonia (recurrent): Secondary | ICD-10-CM | POA: Insufficient documentation

## 2014-09-27 DIAGNOSIS — Z7952 Long term (current) use of systemic steroids: Secondary | ICD-10-CM | POA: Diagnosis not present

## 2014-09-27 DIAGNOSIS — Z720 Tobacco use: Secondary | ICD-10-CM | POA: Diagnosis not present

## 2014-09-27 DIAGNOSIS — Z9119 Patient's noncompliance with other medical treatment and regimen: Secondary | ICD-10-CM | POA: Diagnosis not present

## 2014-09-27 DIAGNOSIS — Z79899 Other long term (current) drug therapy: Secondary | ICD-10-CM | POA: Insufficient documentation

## 2014-09-27 DIAGNOSIS — R51 Headache: Secondary | ICD-10-CM | POA: Insufficient documentation

## 2014-09-27 DIAGNOSIS — Z8719 Personal history of other diseases of the digestive system: Secondary | ICD-10-CM | POA: Insufficient documentation

## 2014-09-27 DIAGNOSIS — G8929 Other chronic pain: Secondary | ICD-10-CM | POA: Insufficient documentation

## 2014-09-27 DIAGNOSIS — I5042 Chronic combined systolic (congestive) and diastolic (congestive) heart failure: Secondary | ICD-10-CM | POA: Diagnosis not present

## 2014-09-27 DIAGNOSIS — I129 Hypertensive chronic kidney disease with stage 1 through stage 4 chronic kidney disease, or unspecified chronic kidney disease: Secondary | ICD-10-CM | POA: Diagnosis not present

## 2014-09-27 DIAGNOSIS — M542 Cervicalgia: Secondary | ICD-10-CM | POA: Diagnosis not present

## 2014-09-27 DIAGNOSIS — N189 Chronic kidney disease, unspecified: Secondary | ICD-10-CM | POA: Diagnosis not present

## 2014-09-27 DIAGNOSIS — Z8709 Personal history of other diseases of the respiratory system: Secondary | ICD-10-CM | POA: Diagnosis not present

## 2014-09-27 DIAGNOSIS — E785 Hyperlipidemia, unspecified: Secondary | ICD-10-CM | POA: Insufficient documentation

## 2014-09-27 DIAGNOSIS — R519 Headache, unspecified: Secondary | ICD-10-CM

## 2014-09-27 LAB — CBC WITH DIFFERENTIAL/PLATELET
Basophils Absolute: 0 10*3/uL (ref 0.0–0.1)
Basophils Relative: 1 % (ref 0–1)
Eosinophils Absolute: 0.2 10*3/uL (ref 0.0–0.7)
Eosinophils Relative: 4 % (ref 0–5)
HCT: 29.8 % — ABNORMAL LOW (ref 39.0–52.0)
Hemoglobin: 9.8 g/dL — ABNORMAL LOW (ref 13.0–17.0)
LYMPHS ABS: 1.6 10*3/uL (ref 0.7–4.0)
Lymphocytes Relative: 30 % (ref 12–46)
MCH: 28.7 pg (ref 26.0–34.0)
MCHC: 32.9 g/dL (ref 30.0–36.0)
MCV: 87.1 fL (ref 78.0–100.0)
Monocytes Absolute: 0.5 10*3/uL (ref 0.1–1.0)
Monocytes Relative: 10 % (ref 3–12)
NEUTROS PCT: 55 % (ref 43–77)
Neutro Abs: 2.9 10*3/uL (ref 1.7–7.7)
Platelets: 195 10*3/uL (ref 150–400)
RBC: 3.42 MIL/uL — ABNORMAL LOW (ref 4.22–5.81)
RDW: 14.1 % (ref 11.5–15.5)
WBC: 5.3 10*3/uL (ref 4.0–10.5)

## 2014-09-27 LAB — BASIC METABOLIC PANEL
Anion gap: 8 (ref 5–15)
BUN: 8 mg/dL (ref 6–20)
CO2: 26 mmol/L (ref 22–32)
CREATININE: 1.51 mg/dL — AB (ref 0.61–1.24)
Calcium: 9.2 mg/dL (ref 8.9–10.3)
Chloride: 107 mmol/L (ref 101–111)
GFR calc Af Amer: >60 mL/min (ref >60)
GFR calc non Af Amer: 55 mL/min — ABNORMAL LOW (ref >60)
Glucose, Bld: 105 mg/dL — ABNORMAL HIGH (ref 65–99)
Potassium: 4.3 mmol/L (ref 3.5–5.1)
Sodium: 141 mmol/L (ref 135–145)

## 2014-09-27 MED ORDER — METOCLOPRAMIDE HCL 5 MG/ML IJ SOLN
5.0000 mg | Freq: Once | INTRAMUSCULAR | Status: AC
  Administered 2014-09-27: 5 mg via INTRAVENOUS
  Filled 2014-09-27: qty 2

## 2014-09-27 MED ORDER — FENTANYL CITRATE (PF) 100 MCG/2ML IJ SOLN
50.0000 ug | Freq: Once | INTRAMUSCULAR | Status: AC
  Administered 2014-09-27: 50 ug via INTRAVENOUS
  Filled 2014-09-27: qty 2

## 2014-09-27 MED ORDER — HYDROMORPHONE HCL 1 MG/ML IJ SOLN
0.5000 mg | Freq: Once | INTRAMUSCULAR | Status: AC
  Administered 2014-09-27: 0.5 mg via INTRAVENOUS
  Filled 2014-09-27: qty 1

## 2014-09-27 MED ORDER — SODIUM CHLORIDE 0.9 % IV BOLUS (SEPSIS)
500.0000 mL | Freq: Once | INTRAVENOUS | Status: AC
  Administered 2014-09-27: 500 mL via INTRAVENOUS

## 2014-09-27 MED ORDER — OXYCODONE-ACETAMINOPHEN 5-325 MG PO TABS: 1.0000 | ORAL_TABLET | Freq: Four times a day (QID) | ORAL | Status: DC | PRN

## 2014-09-27 MED ORDER — CYCLOBENZAPRINE HCL 10 MG PO TABS: 10.0000 mg | ORAL_TABLET | Freq: Two times a day (BID) | ORAL | Status: DC | PRN

## 2014-09-27 MED ORDER — IOHEXOL 350 MG/ML SOLN
50.0000 mL | Freq: Once | INTRAVENOUS | Status: AC | PRN
  Administered 2014-09-27: 50 mL via INTRAVENOUS

## 2014-09-27 MED ORDER — DIPHENHYDRAMINE HCL 50 MG/ML IJ SOLN
25.0000 mg | Freq: Once | INTRAMUSCULAR | Status: AC
  Administered 2014-09-27: 25 mg via INTRAVENOUS
  Filled 2014-09-27: qty 1

## 2014-09-27 MED ORDER — ACETAMINOPHEN 500 MG PO TABS
1000.0000 mg | ORAL_TABLET | Freq: Once | ORAL | Status: AC
  Administered 2014-09-27: 1000 mg via ORAL
  Filled 2014-09-27: qty 2

## 2014-09-27 NOTE — ED Notes (Signed)
Dr. Aline Brochure made aware that patients pain unchanged 10/10 after pain medication administration.

## 2014-09-27 NOTE — ED Notes (Signed)
Patient returned from CT

## 2014-09-27 NOTE — ED Notes (Signed)
MD Aline Brochure made aware that patient is still having pain 8/10 on the left side of the head.

## 2014-09-27 NOTE — ED Provider Notes (Signed)
CSN: [id number]     Arrival date & time 09/27/14  0701 History   First MD Initiated Contact with Patient 09/27/14 [phone]     Chief Complaint  Patient presents with  . Headache  . Neck Pain     (Consider location/radiation/quality/duration/timing/severity/associated sxs/prior Treatment) Patient is a 44 y.o. male presenting with headaches and neck pain. The history is provided by the patient.  Headache Pain location:  L parietal Quality:  Dull Radiates to:  L neck Severity currently:  10/10 Severity at highest:  10/10 Onset quality:  Gradual Duration:  2 weeks Timing:  Constant Progression:  Unchanged Chronicity:  New Similar to prior headaches: yes   Context comment:  Awoke w/ the pain Relieved by:  Nothing Worsened by:  Nothing Ineffective treatments:  Acetaminophen Associated symptoms: no abdominal pain, no cough, no diarrhea, no eye pain, no fever, no nausea, no neck pain, no numbness and no vomiting   Neck Pain Associated symptoms: headaches   Associated symptoms: no chest pain, no fever and no numbness     Past Medical History  Diagnosis Date  . Hypertension     a. Hx of HTN urgency secondary to noncompliance. b. urinary metanephrine and catecholeamine levels normal 2013.  [name] Anxiety   . GERD (gastroesophageal reflux disease)   . Acute renal failure     June 2012 felt secondary to Toradol  . Chronic renal failure   . Chronic combined systolic and diastolic congestive heart failure     a. 05/2011: Adm with pulm edema/HTN urgency, EF 35-40% with diffuse hypokinesis and moderate to severe mitral regurgitation. Cardiomyopathy likely due to uncontrolled HTN and ETOH abuse - cath deferred due to renal insufficiency (felt due to uncontrolled HTN). bJodie Echevaria MV 06/2011: EF 37% and no ischemia or infarction. c. EF 45-50% by echo 01/2012.  . Cardiomyopathy     non-ischemic - probably related to untreated HTN and ETOH abuse  . HYPERLIPIDEMIA   . INGUINAL HERNIA   . ETOH abuse      a. Reported to have quit 05/2011.  . Valvular heart disease     a. Echo 05/2011: moderate to severe eccentric MR and mild to moderate AI with prolapsing left coronary cusp. b. Echo 01/2012: mild-mod AI, mild dilitation of aortic root, mild MR.;  b. 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 CKD (chronic kidney disease)     a. 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  peak creatinine 3.46 during admx for aortic dissection 2/16  . Tobacco abuse   . Pneumonia ~ 2013  . Headache(784.0)     "q other day" (08/08/2013)  . Chronic sinusitis   . History of medication noncompliance   . Chronic abdominal pain   . DDD (degenerative disc disease), lumbar   . Aortic dissection     a. admx 04/2014 >> L renal infarct; a/c renal failure >> VVS to re-eval 05/2014 to decide +/- surgery   Past Surgical History  Procedure Laterality Date  . Ankle surgery      Fractures bilaterally  . Inguinal hernia repair Right ~ 1996  . Foot fracture surgery Bilateral 2004-2010    "got pins in both of them"  . Left heart catheterization with coronary angiogram N/A 06/21/2014    Procedure: LEFT HEART CATHETERIZATION WITH CORONARY ANGIOGRAM;  Surgeon: Larey Dresser, MD;  Location: [facility] C[facility] LAB;  Service: Cardiovascular;  Laterality:  N/A;   Family History  Problem Relation Age of Onset  . Hypertension    . Colon cancer Paternal Uncle   . Stroke Maternal Aunt   . Heart attack Brother   . Diabetes Maternal Aunt   . Throat cancer Neg Hx   . Pancreatic cancer Neg Hx   . Esophageal cancer Neg Hx   . Kidney disease Neg Hx   . Liver disease Neg Hx    History  Substance Use Topics  . Smoking status: Current Every Day Smoker -- 0.25 packs/day for 23 years    Types: Cigarettes    Last Attempt to Quit: 05/03/2014  . Smokeless tobacco: Former Systems developer    Types: Chew     Comment: as of 05-22-14 down to 3-4 cigs daily  . Alcohol Use: No      Comment: + previous use    Review of Systems  Constitutional: Negative for fever.  HENT: Negative for drooling and rhinorrhea.   Eyes: Negative for pain.  Respiratory: Negative for cough and shortness of breath.   Cardiovascular: Negative for chest pain and leg swelling.  Gastrointestinal: Negative for nausea, vomiting, abdominal pain and diarrhea.  Genitourinary: Negative for dysuria and hematuria.  Musculoskeletal: Negative for gait problem and neck pain.  Skin: Negative for color change.  Neurological: Positive for headaches. Negative for numbness.  Hematological: Negative for adenopathy.  Psychiatric/Behavioral: Negative for behavioral problems.  All other systems reviewed and are negative.     Allergies  Imdur  Home Medications   Prior to Admission medications   Medication Sig Start Date End Date Taking? Authorizing Provider  acetaminophen (TYLENOL) 500 MG tablet Take 1,500 mg by mouth 2 (two) times daily as needed for moderate pain.   Yes Historical Provider, MD  amLODipine (NORVASC) 10 MG tablet Take 1 tablet by mouth daily. 06/18/14  Yes Historical Provider, MD  atorvastatin (LIPITOR) 20 MG tablet Take 20 mg by mouth daily.   Yes Historical Provider, MD  cloNIDine (CATAPRES) 0.2 MG tablet Take 1 tablet (0.2 mg total) by mouth 3 (three) times daily. 05/03/14  Yes Orson Eva, MD  hydrALAZINE (APRESOLINE) 100 MG tablet Take 1 tablet (100 mg total) by mouth 3 (three) times daily. Patient taking differently: Take 200 mg by mouth 3 (three) times daily.  05/03/14  Yes Orson Eva, MD  hydrocortisone (ANUSOL-HC) 2.5 % rectal cream Apply topically to hemorrhoid twice daily after using the Lidocaine 2% jelly 09/12/14  Yes Amy S Esterwood, PA-C  hydrocortisone (ANUSOL-HC) 25 MG suppository Use 1 suppository every night at bedtime. 07/23/14  Yes Amy S Esterwood, PA-C  labetalol (NORMODYNE) 200 MG tablet Take 2 tablets (400 mg total) by mouth daily. 06/15/14  Yes Liliane Shi, PA-C  lidocaine  (XYLOCAINE) 2 % jelly Apply 1 application topically as needed. For external hemorrhoids 09/10/14  Yes Hanna Patel-Mills, PA-C  minoxidil (LONITEN) 2.5 MG tablet Take 1 tablet (2.5 mg total) by mouth 2 (two) times daily. 06/15/14  Yes Liliane Shi, PA-C  spironolactone (ALDACTONE) 25 MG tablet 1/2 tablet (12.5mg ) daily Patient taking differently: 12.5 mg. 1/2 tablet (12.5mg ) daily 08/02/14  Yes Larey Dresser, MD  nitroGLYCERIN (NITRODUR - DOSED IN MG/24 HR) 0.4 mg/hr patch Place 1 patch (0.4 mg total) onto the skin daily. Patient not taking: Reported on 09/27/2014 05/03/14   Orson Eva, MD  oxyCODONE-acetaminophen (PERCOCET/ROXICET) 5-325 MG per tablet Take 2 tablets by mouth every 4 (four) hours as needed for severe pain. Patient not taking: Reported on  09/27/2014 09/10/14   Ottie Glazier, PA-C  traMADol (ULTRAM) 50 MG tablet Take 1 tablet at bedtime PRN pain Patient not taking: Reported on 09/27/2014 09/12/14   Amy S Esterwood, PA-C   BP 152/87 mmHg  Pulse 79  Temp(Src) 98.3 F (36.8 C) (Oral)  Resp 16  SpO2 98% Physical Exam  Constitutional: He is oriented to person, place, and time. He appears well-developed and well-nourished.  HENT:  Head: Normocephalic and atraumatic.  Right Ear: External ear normal.  Left Ear: External ear normal.  Nose: Nose normal.  Mouth/Throat: Oropharynx is clear and moist. No oropharyngeal exudate.  Palpable pulses in the temporal arteries bilaterally. No temporal artery tenderness with palpation.  Eyes: Conjunctivae and EOM are normal. Pupils are equal, round, and reactive to light.  Neck: Neck supple.  Mildly limited range of motion of the neck to the left.  Tenderness of the left lateral neck which appears to be localized on the sternocleidomastoid muscle.  Cardiovascular: Normal rate, regular rhythm, normal heart sounds and intact distal pulses.  Exam reveals no gallop and no friction rub.   No murmur heard. Pulmonary/Chest: Effort normal and breath  sounds normal. No respiratory distress. He has no wheezes.  Abdominal: Soft. Bowel sounds are normal. He exhibits no distension. There is no tenderness. There is no rebound and no guarding.  Musculoskeletal: Normal range of motion. He exhibits no edema or tenderness.  2+ and equal distal pulses in the upper extremities.  Neurological: He is alert and oriented to person, place, and time.  alert, oriented x3 speech: normal in context and clarity memory: intact grossly cranial nerves II-XII: intact motor strength: full proximally and distally no involuntary movements or tremors sensation: intact to light touch diffusely  cerebellar: finger-to-nose and heel-to-shin intact gait: normal forwards and backwards  Skin: Skin is warm and dry.  Psychiatric: He has a normal mood and affect. His behavior is normal.  Nursing note and vitals reviewed.   ED Course  Procedures (including critical care time) Labs Review Labs Reviewed  CBC WITH DIFFERENTIAL/PLATELET  BASIC METABOLIC PANEL    Imaging Review No results found.   EKG Interpretation None      MDM   Final diagnoses:  Headache, unspecified headache type    7:45 AM 44 y.o. male  w hx of HTN, valvular heart disease, and chronic type B aortic dissection, CHF, CKD who presents with left-sided headache and neck pain for the last 2 weeks. The patient states that he awoke with the pain 2 weeks ago and describes it as a aching constant pain. He has taken Tylenol without any significant relief. The pain is reproducible with range of motion of the head to the left as well as palpation of the left lateral neck. He denies any fevers, vision changes, vomiting or head injuries. Vital signs are unremarkable here. He has a normal neurologic exam. We'll get screening labs and treat headache.  11:54 AM: CTA non-contrib.  HA improved some w/ narcotic medications but has resolved w/ tylenol. Pt continues to appear well.  I have discussed the  diagnosis/risks/treatment options with the patient and believe the pt to be eligible for discharge home to follow-up with the HA clinic. Do not think this is occult SAH, temporal arteritis, or other serious intracranial pathology. HA may be related to muscle spasm in the neck, will try muscle relaxant at home. We also discussed returning to the ED immediately if new or worsening sx occur. We discussed the sx which are most  concerning (e.g., worsening pain, fever, confusion) that necessitate immediate return. Medications administered to the patient during their visit and any new prescriptions provided to the patient are listed below.  Medications given during this visit Medications  sodium chloride 0.9 % bolus 500 mL (0 mLs Intravenous Stopped 09/27/14 0947)  metoCLOPramide (REGLAN) injection 5 mg (5 mg Intravenous Given 09/27/14 0755)  diphenhydrAMINE (BENADRYL) injection 25 mg (25 mg Intravenous Given 09/27/14 0757)  fentaNYL (SUBLIMAZE) injection 50 mcg (50 mcg Intravenous Given 09/27/14 0759)  HYDROmorphone (DILAUDID) injection 0.5 mg (0.5 mg Intravenous Given 09/27/14 0913)  iohexol (OMNIPAQUE) 350 MG/ML injection 50 mL (50 mLs Intravenous Contrast Given 09/27/14 1003)  acetaminophen (TYLENOL) tablet 1,000 mg (1,000 mg Oral Given 09/27/14 1033)    Discharge Medication List as of 09/27/2014 11:56 AM    START taking these medications   Details  cyclobenzaprine (FLEXERIL) 10 MG tablet Take 1 tablet (10 mg total) by mouth 2 (two) times daily as needed for muscle spasms., Starting 09/27/2014, Until Discontinued, Print    !! oxyCODONE-acetaminophen (PERCOCET) 5-325 MG per tablet Take 1 tablet by mouth every 6 (six) hours as needed., Starting 09/27/2014, Until Discontinued, Print     !! - Potential duplicate medications found. Please discuss with provider.       Pamella Pert, MD 09/28/14 [phone]

## 2014-09-27 NOTE — ED Notes (Signed)
Dr. Aline Brochure at bedside with patient.

## 2014-09-27 NOTE — ED Notes (Signed)
C/o left sided headache and neck pain for 2 weeks. Denies injury. Is worse in morning when he turns his neck.

## 2014-10-01 ENCOUNTER — Telehealth: Payer: Self-pay | Admitting: Physician Assistant

## 2014-10-01 NOTE — Telephone Encounter (Signed)
On 09/12/14 the patient was given Trazodone in error instead of Tramadol. He took it daily for 8 days. He brought it back in to the pharmacy to ask about it because it looked different. He complained of headaches. He has been to the ER for the headaches, was treated and released. He has been given the correct medication.

## 2014-10-02 DIAGNOSIS — N179 Acute kidney failure, unspecified: Secondary | ICD-10-CM | POA: Diagnosis not present

## 2014-10-02 DIAGNOSIS — J811 Chronic pulmonary edema: Secondary | ICD-10-CM | POA: Diagnosis not present

## 2014-10-02 DIAGNOSIS — E875 Hyperkalemia: Secondary | ICD-10-CM | POA: Diagnosis present

## 2014-10-02 DIAGNOSIS — R140 Abdominal distension (gaseous): Secondary | ICD-10-CM | POA: Diagnosis not present

## 2014-10-02 DIAGNOSIS — I7100 Dissection of unspecified site of aorta: Secondary | ICD-10-CM | POA: Diagnosis not present

## 2014-10-02 DIAGNOSIS — I7103 Dissection of thoracoabdominal aorta: Secondary | ICD-10-CM | POA: Diagnosis not present

## 2014-10-02 DIAGNOSIS — I716 Thoracoabdominal aortic aneurysm, without rupture: Secondary | ICD-10-CM | POA: Diagnosis not present

## 2014-10-02 DIAGNOSIS — D696 Thrombocytopenia, unspecified: Secondary | ICD-10-CM | POA: Diagnosis not present

## 2014-10-02 DIAGNOSIS — I10 Essential (primary) hypertension: Secondary | ICD-10-CM | POA: Diagnosis not present

## 2014-10-02 DIAGNOSIS — D689 Coagulation defect, unspecified: Secondary | ICD-10-CM | POA: Diagnosis not present

## 2014-10-02 DIAGNOSIS — E785 Hyperlipidemia, unspecified: Secondary | ICD-10-CM | POA: Diagnosis present

## 2014-10-02 DIAGNOSIS — K5900 Constipation, unspecified: Secondary | ICD-10-CM | POA: Diagnosis not present

## 2014-10-02 DIAGNOSIS — J982 Interstitial emphysema: Secondary | ICD-10-CM | POA: Diagnosis not present

## 2014-10-02 DIAGNOSIS — G8918 Other acute postprocedural pain: Secondary | ICD-10-CM | POA: Diagnosis not present

## 2014-10-02 DIAGNOSIS — I712 Thoracic aortic aneurysm, without rupture: Secondary | ICD-10-CM | POA: Diagnosis not present

## 2014-10-02 DIAGNOSIS — I517 Cardiomegaly: Secondary | ICD-10-CM | POA: Diagnosis not present

## 2014-10-02 DIAGNOSIS — R0789 Other chest pain: Secondary | ICD-10-CM | POA: Diagnosis not present

## 2014-10-02 DIAGNOSIS — I351 Nonrheumatic aortic (valve) insufficiency: Secondary | ICD-10-CM | POA: Diagnosis not present

## 2014-10-02 DIAGNOSIS — F419 Anxiety disorder, unspecified: Secondary | ICD-10-CM | POA: Diagnosis present

## 2014-10-02 DIAGNOSIS — I129 Hypertensive chronic kidney disease with stage 1 through stage 4 chronic kidney disease, or unspecified chronic kidney disease: Secondary | ICD-10-CM | POA: Diagnosis present

## 2014-10-02 DIAGNOSIS — Z9889 Other specified postprocedural states: Secondary | ICD-10-CM | POA: Diagnosis not present

## 2014-10-02 DIAGNOSIS — R079 Chest pain, unspecified: Secondary | ICD-10-CM | POA: Diagnosis not present

## 2014-10-02 DIAGNOSIS — Z953 Presence of xenogenic heart valve: Secondary | ICD-10-CM | POA: Diagnosis not present

## 2014-10-02 DIAGNOSIS — N189 Chronic kidney disease, unspecified: Secondary | ICD-10-CM | POA: Diagnosis present

## 2014-10-02 DIAGNOSIS — I429 Cardiomyopathy, unspecified: Secondary | ICD-10-CM | POA: Diagnosis present

## 2014-10-02 DIAGNOSIS — J90 Pleural effusion, not elsewhere classified: Secondary | ICD-10-CM | POA: Diagnosis not present

## 2014-10-02 DIAGNOSIS — D72829 Elevated white blood cell count, unspecified: Secondary | ICD-10-CM | POA: Diagnosis not present

## 2014-10-02 DIAGNOSIS — K219 Gastro-esophageal reflux disease without esophagitis: Secondary | ICD-10-CM | POA: Diagnosis present

## 2014-10-02 DIAGNOSIS — R109 Unspecified abdominal pain: Secondary | ICD-10-CM | POA: Diagnosis not present

## 2014-10-02 DIAGNOSIS — J9811 Atelectasis: Secondary | ICD-10-CM | POA: Diagnosis not present

## 2014-10-02 DIAGNOSIS — E440 Moderate protein-calorie malnutrition: Secondary | ICD-10-CM | POA: Diagnosis not present

## 2014-10-02 DIAGNOSIS — G8912 Acute post-thoracotomy pain: Secondary | ICD-10-CM | POA: Diagnosis not present

## 2014-10-02 DIAGNOSIS — Z006 Encounter for examination for normal comparison and control in clinical research program: Secondary | ICD-10-CM | POA: Diagnosis not present

## 2014-10-02 DIAGNOSIS — Z4682 Encounter for fitting and adjustment of non-vascular catheter: Secondary | ICD-10-CM | POA: Diagnosis not present

## 2014-10-02 DIAGNOSIS — R1011 Right upper quadrant pain: Secondary | ICD-10-CM | POA: Diagnosis not present

## 2014-10-02 DIAGNOSIS — F17210 Nicotine dependence, cigarettes, uncomplicated: Secondary | ICD-10-CM | POA: Diagnosis not present

## 2014-10-02 DIAGNOSIS — Z95828 Presence of other vascular implants and grafts: Secondary | ICD-10-CM | POA: Diagnosis not present

## 2014-10-02 DIAGNOSIS — D62 Acute posthemorrhagic anemia: Secondary | ICD-10-CM | POA: Diagnosis not present

## 2014-10-02 DIAGNOSIS — I7101 Dissection of thoracic aorta: Secondary | ICD-10-CM | POA: Diagnosis not present

## 2014-10-02 DIAGNOSIS — I313 Pericardial effusion (noninflammatory): Secondary | ICD-10-CM | POA: Diagnosis not present

## 2014-10-02 DIAGNOSIS — Z452 Encounter for adjustment and management of vascular access device: Secondary | ICD-10-CM | POA: Diagnosis not present

## 2014-10-02 DIAGNOSIS — K668 Other specified disorders of peritoneum: Secondary | ICD-10-CM | POA: Diagnosis not present

## 2014-10-02 DIAGNOSIS — R918 Other nonspecific abnormal finding of lung field: Secondary | ICD-10-CM | POA: Diagnosis not present

## 2014-10-02 DIAGNOSIS — M549 Dorsalgia, unspecified: Secondary | ICD-10-CM | POA: Diagnosis not present

## 2014-10-03 DIAGNOSIS — Z9889 Other specified postprocedural states: Secondary | ICD-10-CM | POA: Insufficient documentation

## 2014-10-03 DIAGNOSIS — Z952 Presence of prosthetic heart valve: Secondary | ICD-10-CM | POA: Insufficient documentation

## 2014-10-03 DIAGNOSIS — S37009A Unspecified injury of unspecified kidney, initial encounter: Secondary | ICD-10-CM | POA: Insufficient documentation

## 2014-10-08 NOTE — Telephone Encounter (Signed)
OK, noted

## 2014-10-12 DIAGNOSIS — R109 Unspecified abdominal pain: Secondary | ICD-10-CM | POA: Insufficient documentation

## 2014-10-17 ENCOUNTER — Other Ambulatory Visit: Payer: Self-pay

## 2014-10-17 MED ORDER — ATORVASTATIN CALCIUM 20 MG PO TABS: 20.0000 mg | ORAL_TABLET | Freq: Every day | ORAL | Status: DC

## 2014-10-24 DIAGNOSIS — J90 Pleural effusion, not elsewhere classified: Secondary | ICD-10-CM | POA: Diagnosis not present

## 2014-10-24 DIAGNOSIS — Z48812 Encounter for surgical aftercare following surgery on the circulatory system: Secondary | ICD-10-CM | POA: Diagnosis not present

## 2014-10-24 DIAGNOSIS — J9811 Atelectasis: Secondary | ICD-10-CM | POA: Diagnosis not present

## 2014-10-24 DIAGNOSIS — I712 Thoracic aortic aneurysm, without rupture: Secondary | ICD-10-CM | POA: Diagnosis not present

## 2014-10-24 DIAGNOSIS — Z953 Presence of xenogenic heart valve: Secondary | ICD-10-CM | POA: Diagnosis not present

## 2014-10-24 DIAGNOSIS — Z8679 Personal history of other diseases of the circulatory system: Secondary | ICD-10-CM | POA: Diagnosis not present

## 2014-10-24 DIAGNOSIS — K5900 Constipation, unspecified: Secondary | ICD-10-CM | POA: Diagnosis not present

## 2014-10-24 DIAGNOSIS — Z95828 Presence of other vascular implants and grafts: Secondary | ICD-10-CM | POA: Diagnosis not present

## 2014-11-01 ENCOUNTER — Emergency Department (HOSPITAL_COMMUNITY): Payer: Medicare Other

## 2014-11-01 ENCOUNTER — Emergency Department (HOSPITAL_COMMUNITY)
Admission: EM | Admit: 2014-11-01 | Discharge: 2014-11-01 | Disposition: A | Discharge status: Home / Self Care | Payer: Medicare Other | Attending: Emergency Medicine | Admitting: Emergency Medicine

## 2014-11-01 ENCOUNTER — Encounter (HOSPITAL_COMMUNITY): Payer: Self-pay

## 2014-11-01 DIAGNOSIS — R079 Chest pain, unspecified: Secondary | ICD-10-CM | POA: Diagnosis not present

## 2014-11-01 DIAGNOSIS — N189 Chronic kidney disease, unspecified: Secondary | ICD-10-CM | POA: Diagnosis not present

## 2014-11-01 DIAGNOSIS — Z720 Tobacco use: Secondary | ICD-10-CM | POA: Diagnosis not present

## 2014-11-01 DIAGNOSIS — I5042 Chronic combined systolic (congestive) and diastolic (congestive) heart failure: Secondary | ICD-10-CM | POA: Diagnosis not present

## 2014-11-01 DIAGNOSIS — K5900 Constipation, unspecified: Secondary | ICD-10-CM | POA: Diagnosis not present

## 2014-11-01 DIAGNOSIS — Z79899 Other long term (current) drug therapy: Secondary | ICD-10-CM | POA: Insufficient documentation

## 2014-11-01 DIAGNOSIS — F419 Anxiety disorder, unspecified: Secondary | ICD-10-CM | POA: Diagnosis not present

## 2014-11-01 DIAGNOSIS — Z8739 Personal history of other diseases of the musculoskeletal system and connective tissue: Secondary | ICD-10-CM | POA: Diagnosis not present

## 2014-11-01 DIAGNOSIS — Z9889 Other specified postprocedural states: Secondary | ICD-10-CM | POA: Diagnosis not present

## 2014-11-01 DIAGNOSIS — N390 Urinary tract infection, site not specified: Secondary | ICD-10-CM | POA: Insufficient documentation

## 2014-11-01 DIAGNOSIS — J189 Pneumonia, unspecified organism: Secondary | ICD-10-CM | POA: Diagnosis not present

## 2014-11-01 DIAGNOSIS — Z9119 Patient's noncompliance with other medical treatment and regimen: Secondary | ICD-10-CM | POA: Diagnosis not present

## 2014-11-01 DIAGNOSIS — G8929 Other chronic pain: Secondary | ICD-10-CM | POA: Diagnosis not present

## 2014-11-01 DIAGNOSIS — R42 Dizziness and giddiness: Secondary | ICD-10-CM

## 2014-11-01 DIAGNOSIS — E785 Hyperlipidemia, unspecified: Secondary | ICD-10-CM | POA: Insufficient documentation

## 2014-11-01 DIAGNOSIS — J159 Unspecified bacterial pneumonia: Secondary | ICD-10-CM | POA: Insufficient documentation

## 2014-11-01 DIAGNOSIS — I129 Hypertensive chronic kidney disease with stage 1 through stage 4 chronic kidney disease, or unspecified chronic kidney disease: Secondary | ICD-10-CM | POA: Diagnosis not present

## 2014-11-01 LAB — URINALYSIS, ROUTINE W REFLEX MICROSCOPIC
Glucose, UA: NEGATIVE mg/dL
Hgb urine dipstick: NEGATIVE
Ketones, ur: NEGATIVE mg/dL
Nitrite: NEGATIVE
Protein, ur: 30 mg/dL — AB
Specific Gravity, Urine: 1.022 (ref 1.005–1.030)
Urobilinogen, UA: 1 mg/dL (ref 0.0–1.0)
pH: 6 (ref 5.0–8.0)

## 2014-11-01 LAB — BASIC METABOLIC PANEL
Anion gap: 9 (ref 5–15)
BUN: 18 mg/dL (ref 6–20)
CALCIUM: 8.9 mg/dL (ref 8.9–10.3)
CO2: 25 mmol/L (ref 22–32)
CREATININE: 1.61 mg/dL — AB (ref 0.61–1.24)
Chloride: 100 mmol/L — ABNORMAL LOW (ref 101–111)
GFR calc Af Amer: 59 mL/min — ABNORMAL LOW (ref >60)
GFR calc non Af Amer: 51 mL/min — ABNORMAL LOW (ref >60)
GLUCOSE: 107 mg/dL — AB (ref 65–99)
Potassium: 5.2 mmol/L — ABNORMAL HIGH (ref 3.5–5.1)
Sodium: 134 mmol/L — ABNORMAL LOW (ref 135–145)

## 2014-11-01 LAB — I-STAT TROPONIN, ED: Troponin i, poc: 0.04 ng/mL (ref 0.00–0.08)

## 2014-11-01 LAB — CBC
HCT: 29.8 % — ABNORMAL LOW (ref 39.0–52.0)
Hemoglobin: 9.5 g/dL — ABNORMAL LOW (ref 13.0–17.0)
MCH: 27.5 pg (ref 26.0–34.0)
MCHC: 31.9 g/dL (ref 30.0–36.0)
MCV: 86.4 fL (ref 78.0–100.0)
PLATELETS: 412 10*3/uL — AB (ref 150–400)
RBC: 3.45 MIL/uL — ABNORMAL LOW (ref 4.22–5.81)
RDW: 15.4 % (ref 11.5–15.5)
WBC: 9.3 10*3/uL (ref 4.0–10.5)

## 2014-11-01 LAB — URINE MICROSCOPIC-ADD ON

## 2014-11-01 MED ORDER — LEVOFLOXACIN 500 MG PO TABS: 500.0000 mg | ORAL_TABLET | Freq: Every day | ORAL | Status: DC

## 2014-11-01 MED ORDER — SODIUM CHLORIDE 0.9 % IV BOLUS (SEPSIS)
1000.0000 mL | Freq: Once | INTRAVENOUS | Status: AC
Start: 2014-11-01 — End: 2014-11-01
  Administered 2014-11-01: 1000 mL via INTRAVENOUS

## 2014-11-01 MED ORDER — LEVOFLOXACIN 500 MG PO TABS
500.0000 mg | ORAL_TABLET | Freq: Once | ORAL | Status: AC
  Administered 2014-11-01: 500 mg via ORAL
  Filled 2014-11-01: qty 1

## 2014-11-01 MED ORDER — FENTANYL CITRATE (PF) 100 MCG/2ML IJ SOLN
75.0000 ug | Freq: Once | INTRAMUSCULAR | Status: AC
  Administered 2014-11-01: 75 ug via INTRAVENOUS
  Filled 2014-11-01: qty 2

## 2014-11-01 MED ORDER — MECLIZINE HCL 25 MG PO TABS
25.0000 mg | ORAL_TABLET | Freq: Once | ORAL | Status: AC
Start: 2014-11-01 — End: 2014-11-01
  Administered 2014-11-01: 25 mg via ORAL
  Filled 2014-11-01: qty 1

## 2014-11-01 MED ORDER — MECLIZINE HCL 50 MG PO TABS: 25.0000 mg | ORAL_TABLET | Freq: Four times a day (QID) | ORAL | Status: DC | PRN

## 2014-11-01 MED ORDER — POLYETHYLENE GLYCOL 3350 17 G PO PACK: 17.0000 g | PACK | Freq: Three times a day (TID) | ORAL | Status: DC | PRN

## 2014-11-01 MED ORDER — DIAZEPAM 5 MG/ML IJ SOLN
5.0000 mg | Freq: Once | INTRAMUSCULAR | Status: AC
  Administered 2014-11-01: 5 mg via INTRAVENOUS
  Filled 2014-11-01: qty 2

## 2014-11-01 NOTE — ED Notes (Signed)
Kohut MD made aware of pt

## 2014-11-01 NOTE — ED Notes (Signed)
MD at bedside. 

## 2014-11-01 NOTE — ED Notes (Addendum)
PT placed in gown and in bed. Pt monitored by pulse ox, bp cuff, and 12-lead. 

## 2014-11-01 NOTE — Discharge Instructions (Signed)
Pneumonia Pneumonia is an infection of the lungs.  CAUSES Pneumonia may be caused by bacteria or a virus. Usually, these infections are caused by breathing infectious particles into the lungs (respiratory tract). SIGNS AND SYMPTOMS   Cough.  Fever.  Chest pain.  Increased rate of breathing.  Wheezing.  Mucus production. DIAGNOSIS  If you have the common symptoms of pneumonia, your health care provider will typically confirm the diagnosis with a chest X-ray. The X-ray will show an abnormality in the lung (pulmonary infiltrate) if you have pneumonia. Other tests of your blood, urine, or sputum may be done to find the specific cause of your pneumonia. Your health care provider may also do tests (blood gases or pulse oximetry) to see how well your lungs are working. TREATMENT  Some forms of pneumonia may be spread to other people when you cough or sneeze. You may be asked to wear a mask before and during your exam. Pneumonia that is caused by bacteria is treated with antibiotic medicine. Pneumonia that is caused by the influenza virus may be treated with an antiviral medicine. Most other viral infections must run their course. These infections will not respond to antibiotics.  HOME CARE INSTRUCTIONS   Cough suppressants may be used if you are losing too much rest. However, coughing protects you by clearing your lungs. You should avoid using cough suppressants if you can.  Your health care provider may have prescribed medicine if he or she thinks your pneumonia is caused by bacteria or influenza. Finish your medicine even if you start to feel better.  Your health care provider may also prescribe an expectorant. This loosens the mucus to be coughed up.  Take medicines only as directed by your health care provider.  Do not smoke. Smoking is a common cause of bronchitis and can contribute to pneumonia. If you are a smoker and continue to smoke, your cough may last several weeks after your  pneumonia has cleared.  A cold steam vaporizer or humidifier in your room or home may help loosen mucus.  Coughing is often worse at night. Sleeping in a semi-upright position in a recliner or using a couple pillows under your head will help with this.  Get rest as you feel it is needed. Your body will usually let you know when you need to rest. PREVENTION A pneumococcal shot (vaccine) is available to prevent a common bacterial cause of pneumonia. This is usually suggested for:  People over 65 years old.  Patients on chemotherapy.  People with chronic lung problems, such as bronchitis or emphysema.  People with immune system problems. If you are over 65 or have a high risk condition, you may receive the pneumococcal vaccine if you have not received it before. In some countries, a routine influenza vaccine is also recommended. This vaccine can help prevent some cases of pneumonia.You may be offered the influenza vaccine as part of your care. If you smoke, it is time to quit. You may receive instructions on how to stop smoking. Your health care provider can provide medicines and counseling to help you quit. SEEK MEDICAL CARE IF: You have a fever. SEEK IMMEDIATE MEDICAL CARE IF:   Your illness becomes worse. This is especially true if you are elderly or weakened from any other disease.  You cannot control your cough with suppressants and are losing sleep.  You begin coughing up blood.  You develop pain which is getting worse or is uncontrolled with medicines.  Any of the symptoms   which initially brought you in for treatment are getting worse rather than better.  You develop shortness of breath or chest pain. MAKE SURE YOU:   Understand these instructions.  Will watch your condition.  Will get help right away if you are not doing well or get worse. Document Released: 03/02/2005 Document Revised: 07/17/2013 Document Reviewed: 05/22/2010 [facility] Patient Information 2015  [place], [place]. This information is not intended to replace advice given to you by your health care provider. Make sure you discuss any questions you have with your health care provider.  Urinary Tract Infection Urinary tract infections (UTIs) can develop anywhere along your urinary tract. Your urinary tract is your body's drainage system for removing wastes and extra water. Your urinary tract includes two kidneys, two ureters, a bladder, and a urethra. Your kidneys are a pair of bean-shaped organs. Each kidney is about the size of your fist. They are located below your ribs, one on each side of your spine. CAUSES Infections are caused by microbes, which are microscopic organisms, including fungi, viruses, and bacteria. These organisms are so small that they can only be seen through a microscope. Bacteria are the microbes that most commonly cause UTIs. SYMPTOMS  Symptoms of UTIs may vary by age and gender of the patient and by the location of the infection. Symptoms in young women typically include a frequent and intense urge to urinate and a painful, burning feeling in the bladder or urethra during urination. Older women and men are more likely to be tired, shaky, and weak and have muscle aches and abdominal pain. A fever may mean the infection is in your kidneys. Other symptoms of a kidney infection include pain in your back or sides below the ribs, nausea, and vomiting. DIAGNOSIS To diagnose a UTI, your caregiver will ask you about your symptoms. Your caregiver also will ask to provide a urine sample. The urine sample will be tested for bacteria and white blood cells. White blood cells are made by your body to help fight infection. TREATMENT  Typically, UTIs can be treated with medication. Because most UTIs are caused by a bacterial infection, they usually can be treated with the use of antibiotics. The choice of antibiotic and length of treatment depend on your symptoms and the type of bacteria causing  your infection. HOME CARE INSTRUCTIONS  If you were prescribed antibiotics, take them exactly as your caregiver instructs you. Finish the medication even if you feel better after you have only taken some of the medication.  Drink enough water and fluids to keep your urine clear or pale yellow.  Avoid caffeine, tea, and carbonated beverages. They tend to irritate your bladder.  Empty your bladder often. Avoid holding urine for long periods of time.  Empty your bladder before and after sexual intercourse.  After a bowel movement, women should cleanse from front to back. Use each tissue only once. SEEK MEDICAL CARE IF:   You have back pain.  You develop a fever.  Your symptoms do not begin to resolve within 3 days. SEEK IMMEDIATE MEDICAL CARE IF:   You have severe back pain or lower abdominal pain.  You develop chills.  You have nausea or vomiting.  You have continued burning or discomfort with urination. MAKE SURE YOU:   Understand these instructions.  Will watch your condition.  Will get help right away if you are not doing well or get worse. Document Released: 12/10/2004 Document Revised: 09/01/2011 Document Reviewed: 04/10/2011 [facility] Patient Information 2015 [place], [place].  This information is not intended to replace advice given to you by your health care provider. Make sure you discuss any questions you have with your health care provider. ° °

## 2014-11-01 NOTE — ED Provider Notes (Signed)
CSN: [id number]     Arrival date & time 11/01/14  [id number] History   First MD Initiated Contact with Patient 11/01/14 [phone]     Chief Complaint  Patient presents with  . Chest Pain     (Consider location/radiation/quality/duration/timing/severity/associated sxs/prior Treatment) HPI   44 year old male with chest pain. 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 Straps the pain is left-sided, sharp and intermittent. Worse with deep inspiration and certain movements. Occasional nonproductive cough. Denies any shortness of breath. No hemoptysis. No fevers or chills. Pain is different than the pain he has been having postop. That pain has been progressively improving. Denies any dizziness, lightheadedness or shortness of breath. Condition is complaining constipation. He is still taking narcotic pain medication although amount has recently been decreased. He is tried stool softeners and laxatives but has used neither on a consistent basis.     Past Medical History  Diagnosis Date  . Hypertension     a. Hx of HTN urgency secondary to noncompliance. b. urinary metanephrine and catecholeamine levels normal 2013.  [name] Anxiety   . GERD (gastroesophageal reflux disease)   . Acute renal failure     June 2012 felt secondary to Toradol  . Chronic renal failure   . Chronic combined systolic and diastolic congestive heart failure     a. 05/2011: Adm with pulm edema/HTN urgency, EF 35-40% with diffuse hypokinesis and moderate to severe mitral regurgitation. Cardiomyopathy likely due to uncontrolled HTN and ETOH abuse - cath deferred due to renal insufficiency (felt due to uncontrolled HTN). bJodie Echevaria MV 06/2011: EF 37% and no ischemia or infarction. c. EF 45-50% by echo 01/2012.  . Cardiomyopathy     non-ischemic - probably related to untreated HTN and ETOH abuse  . HYPERLIPIDEMIA   . INGUINAL  HERNIA   . ETOH abuse     a. Reported to have quit 05/2011.  . Valvular heart disease     a. Echo 05/2011: moderate to severe eccentric MR and mild to moderate AI with prolapsing left coronary cusp. b. Echo 01/2012: mild-mod AI, mild dilitation of aortic root, mild MR.;  b. 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 CKD (chronic kidney disease)     a. 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  peak creatinine 3.46 during admx for aortic dissection 2/16  . Tobacco abuse   . Pneumonia ~ 2013  . Headache(784.0)     "q other day" (08/08/2013)  . Chronic sinusitis   . History of medication noncompliance   . Chronic abdominal pain   . DDD (degenerative disc disease), lumbar   . Aortic dissection     a. admx 04/2014 >> L renal infarct; a/c renal failure >> [facility]VS to re-eval 05/2014 to decide +/- surgery  . Aortic dissection    Past Surgical History  Procedure Laterality Date  . Ankle surgery      Fractures bilaterally  . Inguinal hernia repair Right ~ 1996  . Foot fracture surgery Bilateral 2004-2010    "got pins in both of them"  . Left heart catheterization with coronary angiogram N/A 06/21/2014    Procedure: LEFT HEART CATHETERIZATION WITH CORONARY ANGIOGRAM;  Surgeon: Larey Dresser, MD;  Location: [facility] CATH LAB;  Service: Cardiovascular;  Laterality: N/A;  . Aortic valve surgery  Family History  Problem Relation Age of Onset  . Hypertension    . Colon cancer Paternal Uncle   . Stroke Maternal Aunt   . Heart attack Brother   . Diabetes Maternal Aunt   . Throat cancer Neg Hx   . Pancreatic cancer Neg Hx   . Esophageal cancer Neg Hx   . Kidney disease Neg Hx   . Liver disease Neg Hx    Social History  Substance Use Topics  . Smoking status: Current Every Day Smoker -- 0.25 packs/day for 23 years    Types: Cigarettes    Last Attempt to Quit: 05/03/2014  . Smokeless tobacco: Former Systems developer    Types:  Chew     Comment: as of 05-22-14 down to 3-4 cigs daily  . Alcohol Use: No     Comment: + previous use    Review of Systems  All systems reviewed and negative, other than as noted in HPI.   Allergies  Imdur  Home Medications   Prior to Admission medications   Medication Sig Start Date End Date Taking? Authorizing Provider  acetaminophen (TYLENOL) 500 MG tablet Take 1,500 mg by mouth 2 (two) times daily as needed for moderate pain.    Historical Provider, MD  amLODipine (NORVASC) 10 MG tablet Take 1 tablet by mouth daily. 06/18/14   Historical Provider, MD  atorvastatin (LIPITOR) 20 MG tablet Take 1 tablet (20 mg total) by mouth daily. 10/17/14   Larey Dresser, MD  cloNIDine (CATAPRES) 0.2 MG tablet Take 1 tablet (0.2 mg total) by mouth 3 (three) times daily. 05/03/14   Orson Eva, MD  cyclobenzaprine (FLEXERIL) 10 MG tablet Take 1 tablet (10 mg total) by mouth 2 (two) times daily as needed for muscle spasms. 09/27/14   Pamella Pert, MD  hydrALAZINE (APRESOLINE) 100 MG tablet Take 1 tablet (100 mg total) by mouth 3 (three) times daily. Patient taking differently: Take 200 mg by mouth 3 (three) times daily.  05/03/14   Orson Eva, MD  hydrocortisone (ANUSOL-HC) 2.5 % rectal cream Apply topically to hemorrhoid twice daily after using the Lidocaine 2% jelly 09/12/14   Amy S Esterwood, PA-C  hydrocortisone (ANUSOL-HC) 25 MG suppository Use 1 suppository every night at bedtime. 07/23/14   Amy S Esterwood, PA-C  labetalol (NORMODYNE) 200 MG tablet Take 2 tablets (400 mg total) by mouth daily. 06/15/14   Liliane Shi, PA-C  lidocaine (XYLOCAINE) 2 % jelly Apply 1 application topically as needed. For external hemorrhoids 09/10/14   Ottie Glazier, PA-C  minoxidil (LONITEN) 2.5 MG tablet Take 1 tablet (2.5 mg total) by mouth 2 (two) times daily. 06/15/14   Liliane Shi, PA-C  nitroGLYCERIN (NITRODUR - DOSED IN MG/24 HR) 0.4 mg/hr patch Place 1 patch (0.4 mg total) onto the skin daily. Patient not  taking: Reported on 09/27/2014 05/03/14   Orson Eva, MD  oxyCODONE-acetaminophen (PERCOCET) 5-325 MG per tablet Take 1 tablet by mouth every 6 (six) hours as needed. 09/27/14   Pamella Pert, MD  oxyCODONE-acetaminophen (PERCOCET/ROXICET) 5-325 MG per tablet Take 2 tablets by mouth every 4 (four) hours as needed for severe pain. Patient not taking: Reported on 09/27/2014 09/10/14   Ottie Glazier, PA-C  spironolactone (ALDACTONE) 25 MG tablet 1/2 tablet (12.5mg ) daily Patient taking differently: 12.5 mg. 1/2 tablet (12.5mg ) daily 08/02/14   Larey Dresser, MD  traMADol Veatrice Bourbon) 50 MG tablet Take 1 tablet at bedtime PRN pain Patient not taking: Reported on 09/27/2014 09/12/14   Amy S  Esterwood, PA-C   BP 100/68 mmHg  Pulse 76  Temp(Src) 99.9 F (37.7 C) (Oral)  Resp 18  Ht 5\' 11"  (1.803 m)  Wt 150 lb (68.04 kg)  BMI 20.93 kg/m2  SpO2 99% Physical Exam  ED Course  Procedures (including critical care time) Labs Review Labs Reviewed  BASIC METABOLIC PANEL  CBC    Imaging Review Dg Chest 2 View  11/01/2014   CLINICAL DATA:  Patient status post aortic stent placement 1 month prior. Centralized chest pain.  EXAM: CHEST  2 VIEW  COMPARISON:  CT chest abdomen pelvis 09/02/2014; chest radiograph 09/02/2014.  FINDINGS: Re- demonstrated enlarged cardiac contours. Patient status post aortic stent graft placement. Patient status post median sternotomy. There is a small left pleural effusion with underlying pulmonary consolidation. No definite pneumothorax. Regional skeleton is unremarkable.  IMPRESSION: Patient status post interval placement of aortic stent graft.  Small left pleural effusion with underlying pulmonary consolidation. Considerations include atelectasis or infection.   Electronically Signed   By: Lovey Newcomer M.D.   On: 11/01/2014 11:42   I have personally reviewed and evaluated these images and lab results as part of my medical decision-making.   EKG Interpretation   Date/Time:   Thursday November 01 2014 09:36:54 EDT Ventricular Rate:  77 PR Interval:  171 QRS Duration: 101 QT Interval:  417 QTC Calculation: 472 R Axis:   3 Text Interpretation:  Sinus rhythm Left atrial enlargement LVH with  secondary repolarization abnormality Anterior Q waves, possibly due to LVH  similar to previous Confirmed by Gholson  MD, [place] ([id number]) on 11/01/2014  9:56:22 AM      MDM   Final diagnoses:  UTI (lower urinary tract infection)  HCAP (healthcare-associated pneumonia)  Vertigo  Constipation, unspecified constipation type    44yM with CP. S/p surgery as above. Pain different than pain he has been having in that now pleuritic component. Oral temp 99.9. Refusing rectal temp. CXR with L pleural effusion/consolidation. Concerning enough to tx for possible pneumonia. Consider PE with recent surgery. Has been ambulatory/mobile for several weeks now though. No clinical signs/symtoms of DVT. Denies dyspnea to me. Normal o2 sats on RA. Is not tachycardic although he is on a beta blocker. Atypical for ACS. EKG similar to previous. Troponin normal.   Anemia appears to be stable. Renal insufficiency but appears to be close to baseline. Would preclude CT angiography, but I generally have a low suspicion for acute surgical complications which would necessitate one.   Additionally complaining of constipation. Has tried various stool softeners/laxiatives intermittently. Still taking narcotic pain medication although he reports has decreased this. Having some abdominal pain, but his exam is benign. Stressed importance of sticking to bowel regimen consistently until having regular BM.   UA consistent with UTI. Levaquin should adequately cover for both. Will send urine culture. PRN meclizine for what sounds like vertigo.   Virgel Manifold, MD 11/08/14 [phone]

## 2014-11-01 NOTE — ED Notes (Signed)
Pt reports he had a recent cardiac surgery at [facility] two weeks ago.  Pt reports he tried to get up and walk two days ago and felt like he was going to pass out.  Pt reports the chest pain is central and is increased with coughing.  Pt sts "It feels like its burning all across the top of my chest."  Pt reports diaphoresis, SOB and upper back pain as well.

## 2014-11-03 LAB — URINE CULTURE

## 2014-11-08 ENCOUNTER — Emergency Department (HOSPITAL_COMMUNITY): Payer: Medicare Other

## 2014-11-08 ENCOUNTER — Emergency Department (HOSPITAL_COMMUNITY)
Admission: EM | Admit: 2014-11-08 | Discharge: 2014-11-08 | Disposition: A | Discharge status: Home / Self Care | Payer: Medicare Other | Attending: Emergency Medicine | Admitting: Emergency Medicine

## 2014-11-08 ENCOUNTER — Encounter (HOSPITAL_COMMUNITY): Payer: Self-pay

## 2014-11-08 DIAGNOSIS — F419 Anxiety disorder, unspecified: Secondary | ICD-10-CM | POA: Insufficient documentation

## 2014-11-08 DIAGNOSIS — R011 Cardiac murmur, unspecified: Secondary | ICD-10-CM | POA: Diagnosis not present

## 2014-11-08 DIAGNOSIS — E785 Hyperlipidemia, unspecified: Secondary | ICD-10-CM | POA: Insufficient documentation

## 2014-11-08 DIAGNOSIS — R079 Chest pain, unspecified: Secondary | ICD-10-CM | POA: Diagnosis not present

## 2014-11-08 DIAGNOSIS — Z8719 Personal history of other diseases of the digestive system: Secondary | ICD-10-CM | POA: Insufficient documentation

## 2014-11-08 DIAGNOSIS — G8929 Other chronic pain: Secondary | ICD-10-CM | POA: Insufficient documentation

## 2014-11-08 DIAGNOSIS — I129 Hypertensive chronic kidney disease with stage 1 through stage 4 chronic kidney disease, or unspecified chronic kidney disease: Secondary | ICD-10-CM | POA: Insufficient documentation

## 2014-11-08 DIAGNOSIS — I700 Atherosclerosis of aorta: Secondary | ICD-10-CM | POA: Diagnosis not present

## 2014-11-08 DIAGNOSIS — N189 Chronic kidney disease, unspecified: Secondary | ICD-10-CM | POA: Insufficient documentation

## 2014-11-08 DIAGNOSIS — Z720 Tobacco use: Secondary | ICD-10-CM | POA: Diagnosis not present

## 2014-11-08 DIAGNOSIS — Z8701 Personal history of pneumonia (recurrent): Secondary | ICD-10-CM | POA: Diagnosis not present

## 2014-11-08 DIAGNOSIS — R109 Unspecified abdominal pain: Secondary | ICD-10-CM

## 2014-11-08 DIAGNOSIS — J90 Pleural effusion, not elsewhere classified: Secondary | ICD-10-CM | POA: Diagnosis not present

## 2014-11-08 DIAGNOSIS — Z79899 Other long term (current) drug therapy: Secondary | ICD-10-CM | POA: Diagnosis not present

## 2014-11-08 DIAGNOSIS — I5042 Chronic combined systolic (congestive) and diastolic (congestive) heart failure: Secondary | ICD-10-CM | POA: Insufficient documentation

## 2014-11-08 DIAGNOSIS — R0789 Other chest pain: Secondary | ICD-10-CM | POA: Diagnosis not present

## 2014-11-08 DIAGNOSIS — J984 Other disorders of lung: Secondary | ICD-10-CM | POA: Diagnosis not present

## 2014-11-08 LAB — HEPATIC FUNCTION PANEL
ALT: 53 U/L (ref 17–63)
AST: 37 U/L (ref 15–41)
Albumin: 2.5 g/dL — ABNORMAL LOW (ref 3.5–5.0)
Alkaline Phosphatase: 281 U/L — ABNORMAL HIGH (ref 38–126)
BILIRUBIN DIRECT: 0.2 mg/dL (ref 0.1–0.5)
BILIRUBIN INDIRECT: 0.3 mg/dL (ref 0.3–0.9)
BILIRUBIN TOTAL: 0.5 mg/dL (ref 0.3–1.2)
Total Protein: 7.4 g/dL (ref 6.5–8.1)

## 2014-11-08 LAB — BASIC METABOLIC PANEL
Anion gap: 8 (ref 5–15)
BUN: 10 mg/dL (ref 6–20)
CALCIUM: 9.1 mg/dL (ref 8.9–10.3)
CHLORIDE: 103 mmol/L (ref 101–111)
CO2: 27 mmol/L (ref 22–32)
CREATININE: 1.38 mg/dL — AB (ref 0.61–1.24)
GFR calc Af Amer: >60 mL/min (ref >60)
GFR calc non Af Amer: >60 mL/min (ref >60)
GLUCOSE: 101 mg/dL — AB (ref 65–99)
Potassium: 4.7 mmol/L (ref 3.5–5.1)
Sodium: 138 mmol/L (ref 135–145)

## 2014-11-08 LAB — CBC
HCT: 28.1 % — ABNORMAL LOW (ref 39.0–52.0)
HEMOGLOBIN: 8.8 g/dL — AB (ref 13.0–17.0)
MCH: 27.1 pg (ref 26.0–34.0)
MCHC: 31.3 g/dL (ref 30.0–36.0)
MCV: 86.5 fL (ref 78.0–100.0)
Platelets: 386 10*3/uL (ref 150–400)
RBC: 3.25 MIL/uL — ABNORMAL LOW (ref 4.22–5.81)
RDW: 15.8 % — ABNORMAL HIGH (ref 11.5–15.5)
WBC: 9.8 10*3/uL (ref 4.0–10.5)

## 2014-11-08 LAB — I-STAT TROPONIN, ED: Troponin i, poc: 0 ng/mL (ref 0.00–0.08)

## 2014-11-08 LAB — LIPASE, BLOOD: LIPASE: 29 U/L (ref 22–51)

## 2014-11-08 MED ORDER — OXYCODONE-ACETAMINOPHEN 5-325 MG PO TABS: 1.0000 | ORAL_TABLET | Freq: Four times a day (QID) | ORAL | Status: DC | PRN

## 2014-11-08 MED ORDER — FENTANYL CITRATE (PF) 100 MCG/2ML IJ SOLN
100.0000 ug | Freq: Once | INTRAMUSCULAR | Status: AC
  Administered 2014-11-08: 100 ug via INTRAVENOUS
  Filled 2014-11-08: qty 2

## 2014-11-08 MED ORDER — SODIUM CHLORIDE 0.9 % IV BOLUS (SEPSIS)
1000.0000 mL | Freq: Once | INTRAVENOUS | Status: AC
  Administered 2014-11-08: 1000 mL via INTRAVENOUS

## 2014-11-08 MED ORDER — IOHEXOL 350 MG/ML SOLN
100.0000 mL | Freq: Once | INTRAVENOUS | Status: AC | PRN
  Administered 2014-11-08: 100 mL via INTRAVENOUS

## 2014-11-08 MED ORDER — DOCUSATE SODIUM 100 MG PO CAPS: 100.0000 mg | ORAL_CAPSULE | Freq: Two times a day (BID) | ORAL | Status: DC

## 2014-11-08 NOTE — ED Notes (Signed)
Pt arrived via POV c/o central chest pain starting around 9pm last night, pt has SOB, weakness, and lightheadedness

## 2014-11-08 NOTE — ED Provider Notes (Signed)
CSN: [id number]     Arrival date & time 11/08/14  [id number] History   First MD Initiated Contact with Patient 11/08/14 0654     Chief Complaint  Patient presents with  . Chest Pain     (Consider location/radiation/quality/duration/timing/severity/associated sxs/prior Treatment) HPI  44 year old male presents with chest pain since last night. Last month he had endovascular repair of a chronic aortic dissection. Patient states that around 1 week ago he developed chest pain. He was seen here and diagnosed with pneumonia. His cough since then has resolved. He is still having intermittent chest pain that was worse last night. Also noticed a "knot" in his right abdomen and over the last few days has been noticing swelling over his right groin where they went into his femoral artery. No fevers. Has not noticed increasing swelling of his groin. Rates his pain as severe. His been trying Tylenol with no relief. Complains of shortness of breath. Pain is pleuritic, as it has been over the last 1 week. Pain is midline and right sided.  Past Medical History  Diagnosis Date  . Hypertension     a. Hx of HTN urgency secondary to noncompliance. b. urinary metanephrine and catecholeamine levels normal 2013.  [name] Anxiety   . GERD (gastroesophageal reflux disease)   . Acute renal failure     June 2012 felt secondary to Toradol  . Chronic renal failure   . Chronic combined systolic and diastolic congestive heart failure     a. 05/2011: Adm with pulm edema/HTN urgency, EF 35-40% with diffuse hypokinesis and moderate to severe mitral regurgitation. Cardiomyopathy likely due to uncontrolled HTN and ETOH abuse - cath deferred due to renal insufficiency (felt due to uncontrolled HTN). bJodie Echevaria MV 06/2011: EF 37% and no ischemia or infarction. c. EF 45-50% by echo 01/2012.  . Cardiomyopathy     non-ischemic - probably related to untreated HTN and ETOH abuse  . HYPERLIPIDEMIA   . INGUINAL HERNIA   . ETOH abuse     a.  Reported to have quit 05/2011.  . Valvular heart disease     a. Echo 05/2011: moderate to severe eccentric MR and mild to moderate AI with prolapsing left coronary cusp. b. Echo 01/2012: mild-mod AI, mild dilitation of aortic root, mild MR.;  b. 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 CKD (chronic kidney disease)     a. 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  peak creatinine 3.46 during admx for aortic dissection 2/16  . Tobacco abuse   . Pneumonia ~ 2013  . Headache(784.0)     "q other day" (08/08/2013)  . Chronic sinusitis   . History of medication noncompliance   . Chronic abdominal pain   . DDD (degenerative disc disease), lumbar   . Aortic dissection     a. admx 04/2014 >> L renal infarct; a/c renal failure >> [facility] to re-eval 05/2014 to decide +/- surgery  . Aortic dissection    Past Surgical History  Procedure Laterality Date  . Ankle surgery      Fractures bilaterally  . Inguinal hernia repair Right ~ 1996  . Foot fracture surgery Bilateral 2004-2010    "got pins in both of them"  . Left heart catheterization with coronary angiogram N/A 06/21/2014    Procedure: LEFT HEART CATHETERIZATION WITH CORONARY ANGIOGRAM;  Surgeon: Larey Dresser, MD;  Location: [facility] CATH LAB;  Service:  Cardiovascular;  Laterality: N/A;  . Aortic valve surgery     Family History  Problem Relation Age of Onset  . Hypertension    . Colon cancer Paternal Uncle   . Stroke Maternal Aunt   . Heart attack Brother   . Diabetes Maternal Aunt   . Throat cancer Neg Hx   . Pancreatic cancer Neg Hx   . Esophageal cancer Neg Hx   . Kidney disease Neg Hx   . Liver disease Neg Hx    Social History  Substance Use Topics  . Smoking status: Current Every Day Smoker -- 0.25 packs/day for 23 years    Types: Cigarettes    Last Attempt to Quit: 05/03/2014  . Smokeless tobacco: Former Systems developer    Types: Chew     Comment: as of 05-22-14  down to 3-4 cigs daily  . Alcohol Use: No     Comment: + previous use    Review of Systems  Constitutional: Negative for fever.  Respiratory: Positive for shortness of breath. Negative for cough.   Cardiovascular: Positive for chest pain. Negative for leg swelling.  Gastrointestinal: Positive for abdominal pain and constipation. Negative for vomiting.  All other systems reviewed and are negative.      Allergies  Imdur and Tramadol  Home Medications   Prior to Admission medications   Medication Sig Start Date End Date Taking? Authorizing Provider  acetaminophen (TYLENOL) 500 MG tablet Take 1,500 mg by mouth 2 (two) times daily as needed for moderate pain.    Historical Provider, MD  atorvastatin (LIPITOR) 20 MG tablet Take 1 tablet (20 mg total) by mouth daily. Patient not taking: Reported on 11/01/2014 10/17/14   Larey Dresser, MD  cloNIDine (CATAPRES) 0.2 MG tablet Take 1 tablet (0.2 mg total) by mouth 3 (three) times daily. Patient taking differently: Take 0.2 mg by mouth 2 (two) times daily.  05/03/14   Orson Eva, MD  cyclobenzaprine (FLEXERIL) 10 MG tablet Take 1 tablet (10 mg total) by mouth 2 (two) times daily as needed for muscle spasms. Patient not taking: Reported on 11/01/2014 09/27/14   Pamella Pert, MD  furosemide (LASIX) 20 MG tablet Take 20 mg by mouth daily.    Historical Provider, MD  gabapentin (NEURONTIN) 100 MG capsule Take 200 mg by mouth 2 (two) times daily.    Historical Provider, MD  hydrALAZINE (APRESOLINE) 100 MG tablet Take 1 tablet (100 mg total) by mouth 3 (three) times daily. Patient taking differently: Take 200 mg by mouth 3 (three) times daily.  05/03/14   Orson Eva, MD  hydrocortisone (ANUSOL-HC) 2.5 % rectal cream Apply topically to hemorrhoid twice daily after using the Lidocaine 2% jelly Patient not taking: Reported on 11/01/2014 09/12/14   Amy S Esterwood, PA-C  hydrocortisone (ANUSOL-HC) 25 MG suppository Use 1 suppository every night at  bedtime. Patient not taking: Reported on 11/01/2014 07/23/14   Amy S Esterwood, PA-C  labetalol (NORMODYNE) 200 MG tablet Take 2 tablets (400 mg total) by mouth daily. Patient taking differently: Take 400 mg by mouth 2 (two) times daily.  06/15/14   Liliane Shi, PA-C  levofloxacin (LEVAQUIN) 500 MG tablet Take 1 tablet (500 mg total) by mouth daily. 11/01/14   Virgel Manifold, MD  lidocaine (XYLOCAINE) 2 % jelly Apply 1 application topically as needed. For external hemorrhoids Patient not taking: Reported on 11/01/2014 09/10/14   Ottie Glazier, PA-C  meclizine (ANTIVERT) 50 MG tablet Take 0.5 tablets (25 mg total) by mouth 4 (four)  times daily as needed for dizziness. 11/01/14   Virgel Manifold, MD  minoxidil (LONITEN) 2.5 MG tablet Take 1 tablet (2.5 mg total) by mouth 2 (two) times daily. Patient not taking: Reported on 11/01/2014 06/15/14   Liliane Shi, PA-C  nitroGLYCERIN (NITRODUR - DOSED IN MG/24 HR) 0.4 mg/hr patch Place 1 patch (0.4 mg total) onto the skin daily. Patient not taking: Reported on 09/27/2014 05/03/14   Orson Eva, MD  oxyCODONE-acetaminophen (PERCOCET) 5-325 MG per tablet Take 1 tablet by mouth every 6 (six) hours as needed. Patient not taking: Reported on 11/01/2014 09/27/14   Pamella Pert, MD  oxyCODONE-acetaminophen (PERCOCET/ROXICET) 5-325 MG per tablet Take 2 tablets by mouth every 4 (four) hours as needed for severe pain. Patient not taking: Reported on 09/27/2014 09/10/14   Ottie Glazier, PA-C  polyethylene glycol (MIRALAX / GLYCOLAX) packet Take 17 g by mouth 3 (three) times daily as needed. 11/01/14   Virgel Manifold, MD  spironolactone (ALDACTONE) 25 MG tablet 1/2 tablet (12.5mg ) daily Patient not taking: Reported on 11/01/2014 08/02/14   Larey Dresser, MD  traMADol Veatrice Bourbon) 50 MG tablet Take 1 tablet at bedtime PRN pain Patient not taking: Reported on 09/27/2014 09/12/14   Amy S Esterwood, PA-C   BP 166/93 mmHg  Pulse 75  Temp(Src) 98.7 F (37.1 C) (Oral)  Resp 25   Ht 5\' 11"  (1.803 m)  Wt 143 lb (64.864 kg)  BMI 19.95 kg/m2  SpO2 100% Physical Exam  Constitutional: He is oriented to person, place, and time. He appears well-developed and well-nourished.  HENT:  Head: Normocephalic and atraumatic.  Right Ear: External ear normal.  Left Ear: External ear normal.  Nose: Nose normal.  Eyes: Right eye exhibits no discharge. Left eye exhibits no discharge.  Neck: Neck supple.  Cardiovascular: Normal rate, regular rhythm and intact distal pulses.   Murmur heard. Pulmonary/Chest: Effort normal and breath sounds normal.  Midline surgical scar is well healed  Abdominal: Soft. There is tenderness.    Mild tenderness in Right mid abdomen  Musculoskeletal: He exhibits no edema.  Neurological: He is alert and oriented to person, place, and time.  Skin: Skin is warm and dry.  Nursing note and vitals reviewed.   ED Course  Procedures (including critical care time) Labs Review Labs Reviewed  BASIC METABOLIC PANEL - Abnormal; Notable for the following:    Glucose, Bld 101 (*)    Creatinine, Ser 1.38 (*)    All other components within normal limits  CBC - Abnormal; Notable for the following:    RBC 3.25 (*)    Hemoglobin 8.8 (*)    HCT 28.1 (*)    RDW 15.8 (*)    All other components within normal limits  HEPATIC FUNCTION PANEL - Abnormal; Notable for the following:    Albumin 2.5 (*)    Alkaline Phosphatase 281 (*)    All other components within normal limits  LIPASE, BLOOD  I-STAT TROPOININ, ED    Imaging Review Dg Chest 2 View  11/08/2014   CLINICAL DATA:  Chest pain.  EXAM: CHEST  2 VIEW  COMPARISON:  November 01, 2014.  FINDINGS: Stable cardiomegaly. Status post stent graft repair of thoracic aortic aneurysm which is unchanged compared to prior exam. No pneumothorax is noted. Stable effusion or scarring is noted in the left lung base. Status post aortic valve repair is well. Right lung is clear. Bony thorax is intact.  IMPRESSION: Postsurgical  changes as described above. Stable mild left pleural effusion  or left basilar scarring is noted compared to prior exam. No significant changes noted compared to prior exam.   Electronically Signed   By: Marijo Conception, M.D.   On: 11/08/2014 08:07   Ct Angio Chest Aorta W/cm &/or Wo/cm  11/08/2014   CLINICAL DATA:  Aortic dissection repair  EXAM: CT ANGIOGRAPHY CHEST, ABDOMEN AND PELVIS  TECHNIQUE: Multidetector CT imaging through the chest, abdomen and pelvis was performed using the standard protocol during bolus administration of intravenous contrast. Multiplanar reconstructed images and MIPs were obtained and reviewed to evaluate the vascular anatomy.  CONTRAST:  149mL OMNIPAQUE IOHEXOL 350 MG/ML SOLN  COMPARISON:  09/02/2014  FINDINGS: CTA CHEST FINDINGS  A stent graft and aortic root replacement have been placed in the thoracic aorta for repair. An artificial valve is now in place. Above the valve, the lumen of the aorta is dilated measuring 4.5 cm. This is most likely graft or native aorta above the valve. There is no operative note available for correlation. Just above the valve, it is probably graft material.  There is an anastomosis to a short segment of native aorta in the ascending aorta. At this level, there is a common origin for the 3 great vessels. This anastomosis is widely patent. The innominate artery, right subclavian artery, right common carotid artery, left common carotid artery, and left subclavian artery are all widely patent via this anastomosis and bypass. They great vessels are positioned anterior to the left innominate vein which is severely narrowed.  Beyond this anastomosis to the great vessels, there is the proximal extent of the stent graft which extends throughout the thoracic aorta to the level of the diaphragmatic hiatus. It is positioned within the true lumen of the aorta. The false lumen is still present and partially opacifies. And intercostal artery is seen opacified with  contrast and feeding the false lumen. This is best appreciated on images 90, 91, and 92 of series 501. Other intercostal vessels may also be contributing to this opacification. This is behaving similar to a type 2 endoleak. Maximal AP and transverse diameter of the mid descending thoracic aorta are 5.2 and 4.3 cm. Previously, these measurements were 4.9 and 4.5 cm. There is no contrast extravasation outside the true or false lumen of the aorta  There is fluid density throughout the mediastinum compatible with postoperative changes. Sternotomy repair is noted. Moderate size left pleural effusion and volume loss in the left lung are present. The heart is noted to be enlarged with left ventricular myocardial hypertrophy.  No obvious evidence of pulmonary thromboembolism.  Minimal dependent atelectasis in the right lung. No lung mass. No evidence of interstitial edema. Airway is patent. Tiny right pleural effusion.  Previously describe soft tissue mass anterior to the aortic arch is obscured by fluid density.  Review of the MIP images confirms the above findings.  CTA ABDOMEN AND PELVIS FINDINGS  The aortic dissection within the abdominal aorta is again noted. Previously, the true lumen was very narrow. After stent graft repair, the true lumen has markedly improved in caliber. The false lumen continues to opacify and hands in the distal aorta. There is a fenestration distally. This can be seen on image 178 of series 501 and image 88 of series 503.  Celiac takeoff occurs from the false lumen. It is patent. Branch vessels are patent and non aneurysmal.  SMA takeoff occur is from the true lumen. It is widely patent. There is no evidence of extension of dissection into the SMA.  IMA is severely diminutive.  Branch vessels are grossly patent.  Right renal artery is patent from the true lumen. Left renal artery takeoff occurs from the aorta at the level of the fenestration. There is severe narrowing at the origin of the left  renal artery. This is best appreciated on sagittal reconstruction images. See images 90-94 of series 503.  Maximal AP and transverse diameters of the aorta are 3.6 and 5.3 cm. Based on my direct measurements of the prior study, this has increased from 2.9 and 4.3 cm.  Right and left common iliac arteries are patent and stable in appearance with mild ectasia left greater than right. Maximal left common iliac artery diameter is 1.9 cm. That of the right is 1.5 cm. Scattered atherosclerotic changes are present.  Left common femoral artery is ectatic measuring 12 mm in caliber. There is a 4.4 x 5.0 cm. Fluid collection in the right inguinal region which is new and likely postoperative. There is no evidence of pseudoaneurysm.  Liver, spleen, pancreas, adrenal glands, and kidneys are within normal limits. Small amount of fluid adjacent to the gallbladder and liver is likely related to edema. There is minimal scattered free-fluid in the abdomen and pelvis.  No vertebral compression deformity.  Review of the MIP images confirms the above findings.  IMPRESSION: The patient is now status post aortic valve replacement, aortic stent graft repair, and great vessel re- attachment to the ascending aorta. The great vessels are patent. The artificial aortic valve is in place, and the stent graft is in place with improvement in the true lumen. There continues to be opacification of the false lumen due to fenestration in the abdomen as well as and inter costal feeding vessel within the thorax. AP diameter of the thoracic aorta has increased from 4.9 cm to 5.2 cm.  Moderate left pleural effusion has developed with volume loss in the lungs.  In the abdomen, the true lumen is improved with a larger diameter. The false lumen continues to opacified.  There continues to be narrowing at the origin of the left renal artery as described above.  Maximal transverse diameter of the abdominal aorta has increased from 4.3 cm to 5.3 cm.  There is a  5.0 cm fluid collection in the right groin that is likely postoperative. No evidence of pseudoaneurysm in the right groin.   Electronically Signed   By: Marybelle Killings M.D.   On: 11/08/2014 10:05   Ct Angio Abd/pel W/ And/or W/o  11/08/2014   CLINICAL DATA:  Aortic dissection repair  EXAM: CT ANGIOGRAPHY CHEST, ABDOMEN AND PELVIS  TECHNIQUE: Multidetector CT imaging through the chest, abdomen and pelvis was performed using the standard protocol during bolus administration of intravenous contrast. Multiplanar reconstructed images and MIPs were obtained and reviewed to evaluate the vascular anatomy.  CONTRAST:  137mL OMNIPAQUE IOHEXOL 350 MG/ML SOLN  COMPARISON:  09/02/2014  FINDINGS: CTA CHEST FINDINGS  A stent graft and aortic root replacement have been placed in the thoracic aorta for repair. An artificial valve is now in place. Above the valve, the lumen of the aorta is dilated measuring 4.5 cm. This is most likely graft or native aorta above the valve. There is no operative note available for correlation. Just above the valve, it is probably graft material.  There is an anastomosis to a short segment of native aorta in the ascending aorta. At this level, there is a common origin for the 3 great vessels. This anastomosis is widely patent. The innominate  artery, right subclavian artery, right common carotid artery, left common carotid artery, and left subclavian artery are all widely patent via this anastomosis and bypass. They great vessels are positioned anterior to the left innominate vein which is severely narrowed.  Beyond this anastomosis to the great vessels, there is the proximal extent of the stent graft which extends throughout the thoracic aorta to the level of the diaphragmatic hiatus. It is positioned within the true lumen of the aorta. The false lumen is still present and partially opacifies. And intercostal artery is seen opacified with contrast and feeding the false lumen. This is best appreciated  on images 90, 91, and 92 of series 501. Other intercostal vessels may also be contributing to this opacification. This is behaving similar to a type 2 endoleak. Maximal AP and transverse diameter of the mid descending thoracic aorta are 5.2 and 4.3 cm. Previously, these measurements were 4.9 and 4.5 cm. There is no contrast extravasation outside the true or false lumen of the aorta  There is fluid density throughout the mediastinum compatible with postoperative changes. Sternotomy repair is noted. Moderate size left pleural effusion and volume loss in the left lung are present. The heart is noted to be enlarged with left ventricular myocardial hypertrophy.  No obvious evidence of pulmonary thromboembolism.  Minimal dependent atelectasis in the right lung. No lung mass. No evidence of interstitial edema. Airway is patent. Tiny right pleural effusion.  Previously describe soft tissue mass anterior to the aortic arch is obscured by fluid density.  Review of the MIP images confirms the above findings.  CTA ABDOMEN AND PELVIS FINDINGS  The aortic dissection within the abdominal aorta is again noted. Previously, the true lumen was very narrow. After stent graft repair, the true lumen has markedly improved in caliber. The false lumen continues to opacify and hands in the distal aorta. There is a fenestration distally. This can be seen on image 178 of series 501 and image 88 of series 503.  Celiac takeoff occurs from the false lumen. It is patent. Branch vessels are patent and non aneurysmal.  SMA takeoff occur is from the true lumen. It is widely patent. There is no evidence of extension of dissection into the SMA.  IMA is severely diminutive.  Branch vessels are grossly patent.  Right renal artery is patent from the true lumen. Left renal artery takeoff occurs from the aorta at the level of the fenestration. There is severe narrowing at the origin of the left renal artery. This is best appreciated on sagittal  reconstruction images. See images 90-94 of series 503.  Maximal AP and transverse diameters of the aorta are 3.6 and 5.3 cm. Based on my direct measurements of the prior study, this has increased from 2.9 and 4.3 cm.  Right and left common iliac arteries are patent and stable in appearance with mild ectasia left greater than right. Maximal left common iliac artery diameter is 1.9 cm. That of the right is 1.5 cm. Scattered atherosclerotic changes are present.  Left common femoral artery is ectatic measuring 12 mm in caliber. There is a 4.4 x 5.0 cm. Fluid collection in the right inguinal region which is new and likely postoperative. There is no evidence of pseudoaneurysm.  Liver, spleen, pancreas, adrenal glands, and kidneys are within normal limits. Small amount of fluid adjacent to the gallbladder and liver is likely related to edema. There is minimal scattered free-fluid in the abdomen and pelvis.  No vertebral compression deformity.  Review of the  MIP images confirms the above findings.  IMPRESSION: The patient is now status post aortic valve replacement, aortic stent graft repair, and great vessel re- attachment to the ascending aorta. The great vessels are patent. The artificial aortic valve is in place, and the stent graft is in place with improvement in the true lumen. There continues to be opacification of the false lumen due to fenestration in the abdomen as well as and inter costal feeding vessel within the thorax. AP diameter of the thoracic aorta has increased from 4.9 cm to 5.2 cm.  Moderate left pleural effusion has developed with volume loss in the lungs.  In the abdomen, the true lumen is improved with a larger diameter. The false lumen continues to opacified.  There continues to be narrowing at the origin of the left renal artery as described above.  Maximal transverse diameter of the abdominal aorta has increased from 4.3 cm to 5.3 cm.  There is a 5.0 cm fluid collection in the right groin that is  likely postoperative. No evidence of pseudoaneurysm in the right groin.   Electronically Signed   By: Marybelle Killings M.D.   On: 11/08/2014 10:05   I have personally reviewed and evaluated these images and lab results as part of my medical decision-making.   EKG Interpretation   Date/Time:  Thursday November 08 2014 06:42:36 EDT Ventricular Rate:  69 PR Interval:  161 QRS Duration: 98 QT Interval:  412 QTC Calculation: 441 R Axis:   18 Text Interpretation:  Sinus rhythm Left atrial enlargement Repol abnrm  suggests ischemia, lateral leads No significant change since last tracing  Confirmed by OTTER  MD, OLGA ([id number]) on 11/08/2014 6:45:24 AM      MDM   Final diagnoses:  Chest pain, unspecified chest pain type    Patient with atypical chest pain on and off for the past 1 week. His renal function is better and after discussion with patient he prefers to go ahead with a CT with contrast given his recent aortic repair and concern for possible aortic pathology. He understands that giving contrast could make his kidneys worse again. I have given him IV fluids as well. Patient's chest pain has improved. His pleural effusion is not much improved but he states there is no more cough and thus I do not feel more antibiotics are warranted. Patient's groin swelling appears to be a postop seroma with no signs of abscess. I doubt ACS. I discussed with the CT surgery PA for Dr. [name] at [facility] who feels with this negative workup this is unlikely to be postop aorta issue. CT was more geared towards the aorta but there is no obvious pulmonary embolus. At this point will refer to his PCP and discussed strict return precautions.    Sherwood Gambler, MD 11/08/14 [phone]

## 2014-11-08 NOTE — Discharge Instructions (Signed)
Chest Pain (Nonspecific) °It is often hard to give a specific diagnosis for the cause of chest pain. There is always a chance that your pain could be related to something serious, such as a heart attack or a blood clot in the lungs. You need to follow up with your health care provider for further evaluation. °CAUSES  °· Heartburn. °· Pneumonia or bronchitis. °· Anxiety or stress. °· Inflammation around your heart (pericarditis) or lung (pleuritis or pleurisy). °· A blood clot in the lung. °· A collapsed lung (pneumothorax). It can develop suddenly on its own (spontaneous pneumothorax) or from trauma to the chest. °· Shingles infection (herpes zoster virus). °The chest wall is composed of bones, muscles, and cartilage. Any of these can be the source of the pain. °· The bones can be bruised by injury. °· The muscles or cartilage can be strained by coughing or overwork. °· The cartilage can be affected by inflammation and become sore (costochondritis). °DIAGNOSIS  °Lab tests or other studies may be needed to find the cause of your pain. Your health care provider may have you take a test called an ambulatory electrocardiogram (ECG). An ECG records your heartbeat patterns over a 24-hour period. You may also have other tests, such as: °· Transthoracic echocardiogram (TTE). During echocardiography, sound waves are used to evaluate how blood flows through your heart. °· Transesophageal echocardiogram (TEE). °· Cardiac monitoring. This allows your health care provider to monitor your heart rate and rhythm in real time. °· Holter monitor. This is a portable device that records your heartbeat and can help diagnose heart arrhythmias. It allows your health care provider to track your heart activity for several days, if needed. °· Stress tests by exercise or by giving medicine that makes the heart beat faster. °TREATMENT  °· Treatment depends on what may be causing your chest pain. Treatment may include: °· Acid blockers for  heartburn. °· Anti-inflammatory medicine. °· Pain medicine for inflammatory conditions. °· Antibiotics if an infection is present. °· You may be advised to change lifestyle habits. This includes stopping smoking and avoiding alcohol, caffeine, and chocolate. °· You may be advised to keep your head raised (elevated) when sleeping. This reduces the chance of acid going backward from your stomach into your esophagus. °Most of the time, nonspecific chest pain will improve within 2-3 days with rest and mild pain medicine.  °HOME CARE INSTRUCTIONS  °· If antibiotics were prescribed, take them as directed. Finish them even if you start to feel better. °· For the next few days, avoid physical activities that bring on chest pain. Continue physical activities as directed. °· Do not use any tobacco products, including cigarettes, chewing tobacco, or electronic cigarettes. °· Avoid drinking alcohol. °· Only take medicine as directed by your health care provider. °· Follow your health care provider's suggestions for further testing if your chest pain does not go away. °· Keep any follow-up appointments you made. If you do not go to an appointment, you could develop lasting (chronic) problems with pain. If there is any problem keeping an appointment, call to reschedule. °SEEK MEDICAL CARE IF:  °· Your chest pain does not go away, even after treatment. °· You have a rash with blisters on your chest. °· You have a fever. °SEEK IMMEDIATE MEDICAL CARE IF:  °· You have increased chest pain or pain that spreads to your arm, neck, jaw, back, or abdomen. °· You have shortness of breath. °· You have an increasing cough, or you cough   up blood.  You have severe back or abdominal pain.  You feel nauseous or vomit.  You have severe weakness.  You faint.  You have chills. This is an emergency. Do not wait to see if the pain will go away. Get medical help at once. Call your local emergency services (911 in [place] Do not drive  yourself to the hospital. MAKE SURE YOU:   Understand these instructions.  Will watch your condition.  Will get help right away if you are not doing well or get worse. Document Released: 12/10/2004 Document Revised: 03/07/2013 Document Reviewed: 10/06/2007 [facility] Patient Information 2015 [place], [place]. This information is not intended to replace advice given to you by your health care provider. Make sure you discuss any questions you have with your health care provider.   Constipation Constipation is when a person has fewer than three bowel movements a week, has difficulty having a bowel movement, or has stools that are dry, hard, or larger than normal. As people grow older, constipation is more common. If you try to fix constipation with medicines that make you have a bowel movement (laxatives), the problem may get worse. Long-term laxative use may cause the muscles of the colon to become weak. A low-fiber diet, not taking in enough fluids, and taking certain medicines may make constipation worse.  CAUSES   Certain medicines, such as antidepressants, pain medicine, iron supplements, antacids, and water pills.   Certain diseases, such as diabetes, irritable bowel syndrome (IBS), thyroid disease, or depression.   Not drinking enough water.   Not eating enough fiber-rich foods.   Stress or travel.   Lack of physical activity or exercise.   Ignoring the urge to have a bowel movement.   Using laxatives too much.  SIGNS AND SYMPTOMS   Having fewer than three bowel movements a week.   Straining to have a bowel movement.   Having stools that are hard, dry, or larger than normal.   Feeling full or bloated.   Pain in the lower abdomen.   Not feeling relief after having a bowel movement.  DIAGNOSIS  Your health care provider will take a medical history and perform a physical exam. Further testing may be done for severe constipation. Some tests may include:  A  barium enema X-ray to examine your rectum, colon, and, sometimes, your small intestine.   A sigmoidoscopy to examine your lower colon.   A colonoscopy to examine your entire colon. TREATMENT  Treatment will depend on the severity of your constipation and what is causing it. Some dietary treatments include drinking more fluids and eating more fiber-rich foods. Lifestyle treatments may include regular exercise. If these diet and lifestyle recommendations do not help, your health care provider may recommend taking over-the-counter laxative medicines to help you have bowel movements. Prescription medicines may be prescribed if over-the-counter medicines do not work.  HOME CARE INSTRUCTIONS   Eat foods that have a lot of fiber, such as fruits, vegetables, whole grains, and beans.  Limit foods high in fat and processed sugars, such as french fries, hamburgers, cookies, candies, and soda.   A fiber supplement may be added to your diet if you cannot get enough fiber from foods.   Drink enough fluids to keep your urine clear or pale yellow.   Exercise regularly or as directed by your health care provider.   Go to the restroom when you have the urge to go. Do not hold it.   Only take over-the-counter or prescription medicines  as directed by your health care provider. Do not take other medicines for constipation without talking to your health care provider first.  [place] IF:   You have bright red blood in your stool.   Your constipation lasts for more than 4 days or gets worse.   You have abdominal or rectal pain.   You have thin, pencil-like stools.   You have unexplained weight loss. MAKE SURE YOU:   Understand these instructions.  Will watch your condition.  Will get help right away if you are not doing well or get worse. Document Released: 11/29/2003 Document Revised: 03/07/2013 Document Reviewed: 12/12/2012 [facility] Patient Information 2015  [place], [place]. This information is not intended to replace advice given to you by your health care provider. Make sure you discuss any questions you have with your health care provider.

## 2014-11-12 ENCOUNTER — Ambulatory Visit: Payer: Medicare Other | Admitting: Cardiology

## 2014-11-15 ENCOUNTER — Emergency Department (HOSPITAL_COMMUNITY): Payer: Medicare Other

## 2014-11-15 ENCOUNTER — Other Ambulatory Visit: Payer: Self-pay

## 2014-11-15 ENCOUNTER — Encounter (HOSPITAL_COMMUNITY): Payer: Self-pay | Admitting: Emergency Medicine

## 2014-11-15 ENCOUNTER — Emergency Department (HOSPITAL_COMMUNITY)
Admission: EM | Admit: 2014-11-15 | Discharge: 2014-11-15 | Disposition: A | Discharge status: Home / Self Care | Payer: Medicare Other | Attending: Emergency Medicine | Admitting: Emergency Medicine

## 2014-11-15 DIAGNOSIS — G8929 Other chronic pain: Secondary | ICD-10-CM | POA: Insufficient documentation

## 2014-11-15 DIAGNOSIS — N189 Chronic kidney disease, unspecified: Secondary | ICD-10-CM | POA: Diagnosis not present

## 2014-11-15 DIAGNOSIS — Z720 Tobacco use: Secondary | ICD-10-CM | POA: Diagnosis not present

## 2014-11-15 DIAGNOSIS — R109 Unspecified abdominal pain: Secondary | ICD-10-CM | POA: Insufficient documentation

## 2014-11-15 DIAGNOSIS — Z79899 Other long term (current) drug therapy: Secondary | ICD-10-CM | POA: Insufficient documentation

## 2014-11-15 DIAGNOSIS — I129 Hypertensive chronic kidney disease with stage 1 through stage 4 chronic kidney disease, or unspecified chronic kidney disease: Secondary | ICD-10-CM | POA: Diagnosis not present

## 2014-11-15 DIAGNOSIS — R0789 Other chest pain: Secondary | ICD-10-CM | POA: Diagnosis not present

## 2014-11-15 DIAGNOSIS — Z9119 Patient's noncompliance with other medical treatment and regimen: Secondary | ICD-10-CM | POA: Diagnosis not present

## 2014-11-15 DIAGNOSIS — I5042 Chronic combined systolic (congestive) and diastolic (congestive) heart failure: Secondary | ICD-10-CM | POA: Insufficient documentation

## 2014-11-15 DIAGNOSIS — R079 Chest pain, unspecified: Secondary | ICD-10-CM | POA: Insufficient documentation

## 2014-11-15 DIAGNOSIS — Z8709 Personal history of other diseases of the respiratory system: Secondary | ICD-10-CM | POA: Diagnosis not present

## 2014-11-15 DIAGNOSIS — R0602 Shortness of breath: Secondary | ICD-10-CM | POA: Diagnosis not present

## 2014-11-15 DIAGNOSIS — Z8701 Personal history of pneumonia (recurrent): Secondary | ICD-10-CM | POA: Diagnosis not present

## 2014-11-15 DIAGNOSIS — Z8739 Personal history of other diseases of the musculoskeletal system and connective tissue: Secondary | ICD-10-CM | POA: Insufficient documentation

## 2014-11-15 DIAGNOSIS — Z8719 Personal history of other diseases of the digestive system: Secondary | ICD-10-CM | POA: Diagnosis not present

## 2014-11-15 DIAGNOSIS — E785 Hyperlipidemia, unspecified: Secondary | ICD-10-CM | POA: Diagnosis not present

## 2014-11-15 DIAGNOSIS — F419 Anxiety disorder, unspecified: Secondary | ICD-10-CM | POA: Diagnosis not present

## 2014-11-15 LAB — BASIC METABOLIC PANEL
Anion gap: 8 (ref 5–15)
BUN: 10 mg/dL (ref 6–20)
CHLORIDE: 107 mmol/L (ref 101–111)
CO2: 23 mmol/L (ref 22–32)
CREATININE: 1.15 mg/dL (ref 0.61–1.24)
Calcium: 8.7 mg/dL — ABNORMAL LOW (ref 8.9–10.3)
GFR calc Af Amer: >60 mL/min (ref >60)
GFR calc non Af Amer: >60 mL/min (ref >60)
GLUCOSE: 137 mg/dL — AB (ref 65–99)
Potassium: 3.6 mmol/L (ref 3.5–5.1)
Sodium: 138 mmol/L (ref 135–145)

## 2014-11-15 LAB — CBC WITH DIFFERENTIAL/PLATELET
Basophils Absolute: 0 10*3/uL (ref 0.0–0.1)
Basophils Relative: 0 % (ref 0–1)
Eosinophils Absolute: 0.2 10*3/uL (ref 0.0–0.7)
Eosinophils Relative: 2 % (ref 0–5)
HEMATOCRIT: 26.6 % — AB (ref 39.0–52.0)
HEMOGLOBIN: 8.1 g/dL — AB (ref 13.0–17.0)
LYMPHS ABS: 1.8 10*3/uL (ref 0.7–4.0)
Lymphocytes Relative: 20 % (ref 12–46)
MCH: 26 pg (ref 26.0–34.0)
MCHC: 30.5 g/dL (ref 30.0–36.0)
MCV: 85.5 fL (ref 78.0–100.0)
MONOS PCT: 7 % (ref 3–12)
Monocytes Absolute: 0.6 10*3/uL (ref 0.1–1.0)
NEUTROS ABS: 6.2 10*3/uL (ref 1.7–7.7)
NEUTROS PCT: 71 % (ref 43–77)
Platelets: 365 10*3/uL (ref 150–400)
RBC: 3.11 MIL/uL — ABNORMAL LOW (ref 4.22–5.81)
RDW: 15.9 % — ABNORMAL HIGH (ref 11.5–15.5)
WBC: 8.9 10*3/uL (ref 4.0–10.5)

## 2014-11-15 LAB — I-STAT TROPONIN, ED: Troponin i, poc: 0 ng/mL (ref 0.00–0.08)

## 2014-11-15 MED ORDER — OXYCODONE-ACETAMINOPHEN 5-325 MG PO TABS: 1.0000 | ORAL_TABLET | ORAL | Status: DC | PRN

## 2014-11-15 MED ORDER — MORPHINE SULFATE (PF) 4 MG/ML IV SOLN
4.0000 mg | Freq: Once | INTRAVENOUS | Status: AC
  Administered 2014-11-15: 4 mg via INTRAVENOUS
  Filled 2014-11-15: qty 1

## 2014-11-15 MED ORDER — IOHEXOL 350 MG/ML SOLN
100.0000 mL | Freq: Once | INTRAVENOUS | Status: AC | PRN
  Administered 2014-11-15: 100 mL via INTRAVENOUS

## 2014-11-15 MED ORDER — ONDANSETRON HCL 4 MG/2ML IJ SOLN
4.0000 mg | Freq: Once | INTRAMUSCULAR | Status: AC
  Administered 2014-11-15: 4 mg via INTRAVENOUS
  Filled 2014-11-15: qty 2

## 2014-11-15 MED ORDER — ONDANSETRON 4 MG PO TBDP: 4.0000 mg | ORAL_TABLET | Freq: Three times a day (TID) | ORAL | Status: DC | PRN

## 2014-11-15 NOTE — ED Provider Notes (Signed)
CSN: [id number]     Arrival date & time 11/15/14  [id number] History   First MD Initiated Contact with Patient 11/15/14 [id number]     Chief Complaint  Patient presents with  . Chest Pain  . Shortness of Breath  . 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 Patient reports the pain is aching and severe and located in bilateral lungs. Deep inspiration makes the pain worse. No alleviating factors. Patient reports having some aching abdominal pain in the mid abdomen without radiation. He denies NVD, fever. He does not take anticoagulation. He also has a past medical history of hypertension, CHF, CKD, and cardiomyopathy.    Past Medical History  Diagnosis Date  . Hypertension     a. Hx of HTN urgency secondary to noncompliance. b. urinary metanephrine and catecholeamine levels normal 2013.  [name] Anxiety   . GERD (gastroesophageal reflux disease)   . Acute renal failure     June 2012 felt secondary to Toradol  . Chronic renal failure   . Chronic combined systolic and diastolic congestive heart failure     a. 05/2011: Adm with pulm edema/HTN urgency, EF 35-40% with diffuse hypokinesis and moderate to severe mitral regurgitation. Cardiomyopathy likely due to uncontrolled HTN and ETOH abuse - cath deferred due to renal insufficiency (felt due to uncontrolled HTN). bJodie Echevaria MV 06/2011: EF 37% and no ischemia or infarction. c. EF 45-50% by echo 01/2012.  . Cardiomyopathy     non-ischemic - probably related to untreated HTN and ETOH abuse  . HYPERLIPIDEMIA   . INGUINAL HERNIA   . ETOH abuse     a. Reported to have quit 05/2011.  . Valvular heart disease     a. Echo 05/2011: moderate to  severe eccentric MR and mild to moderate AI with prolapsing left coronary cusp. b. Echo 01/2012: mild-mod AI, mild dilitation of aortic root, mild MR.;  b. 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 CKD (chronic kidney disease)     a. 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  peak creatinine 3.46 during admx for aortic dissection 2/16  . Tobacco abuse   . Pneumonia ~ 2013  . Headache(784.0)     "q other day" (08/08/2013)  . Chronic sinusitis   . History of medication noncompliance   . Chronic abdominal pain   . DDD (degenerative disc disease), lumbar   . Aortic dissection     a. admx 04/2014 >> L renal infarct; a/c renal failure >> VVS to re-eval 05/2014 to decide +/- surgery  . Aortic dissection    Past Surgical History  Procedure Laterality Date  . Ankle surgery      Fractures bilaterally  . Inguinal hernia repair Right ~ 1996  . Foot fracture surgery Bilateral 2004-2010    "got pins in both of them"  . Left heart catheterization with coronary angiogram N/A 06/21/2014    Procedure: LEFT HEART CATHETERIZATION WITH CORONARY ANGIOGRAM;  Surgeon: Larey Dresser, MD;  Location: [facility] CATH LAB;  Service: Cardiovascular;  Laterality: N/A;  . Aortic valve surgery     Family History  Problem Relation Age of  Onset  . Hypertension    . Colon cancer Paternal Uncle   . Stroke Maternal Aunt   . Heart attack Brother   . Diabetes Maternal Aunt   . Throat cancer Neg Hx   . Pancreatic cancer Neg Hx   . Esophageal cancer Neg Hx   . Kidney disease Neg Hx   . Liver disease Neg Hx    Social History  Substance Use Topics  . Smoking status: Current Every Day Smoker -- 0.25 packs/day for 23 years    Types: Cigarettes    Last Attempt to Quit: 05/03/2014  . Smokeless tobacco: Former Systems developer    Types: Chew     Comment: as of 05-22-14 down to 3-4 cigs daily  . Alcohol Use: No     Comment: + previous use    Review of  Systems  Constitutional: Negative for fever, chills and fatigue.  HENT: Negative for trouble swallowing.   Eyes: Negative for visual disturbance.  Respiratory: Positive for shortness of breath.   Cardiovascular: Positive for chest pain. Negative for palpitations.  Gastrointestinal: Positive for abdominal pain. Negative for nausea, vomiting and diarrhea.  Genitourinary: Negative for dysuria and difficulty urinating.  Musculoskeletal: Negative for arthralgias and neck pain.  Skin: Negative for color change.  Neurological: Negative for dizziness and weakness.  Psychiatric/Behavioral: Negative for dysphoric mood.      Allergies  Imdur and Tramadol  Home Medications   Prior to Admission medications   Medication Sig Start Date End Date Taking? Authorizing Provider  acetaminophen (TYLENOL) 500 MG tablet Take 1,500 mg by mouth 2 (two) times daily as needed for moderate pain.   Yes Historical Provider, MD  atorvastatin (LIPITOR) 20 MG tablet Take 20 mg by mouth daily. 11/14/14  Yes Historical Provider, MD  cloNIDine (CATAPRES) 0.2 MG tablet Take 1 tablet (0.2 mg total) by mouth 3 (three) times daily. Patient taking differently: Take 0.2 mg by mouth 2 (two) times daily.  05/03/14  Yes Orson Eva, MD  docusate sodium (COLACE) 100 MG capsule Take 1 capsule (100 mg total) by mouth every 12 (twelve) hours. 11/08/14  Yes Sherwood Gambler, MD  furosemide (LASIX) 20 MG tablet Take 20 mg by mouth daily.   Yes Historical Provider, MD  gabapentin (NEURONTIN) 100 MG capsule Take 200 mg by mouth 2 (two) times daily.   Yes Historical Provider, MD  hydrALAZINE (APRESOLINE) 100 MG tablet Take 1 tablet (100 mg total) by mouth 3 (three) times daily. Patient taking differently: Take 200 mg by mouth 3 (three) times daily.  05/03/14  Yes Orson Eva, MD  labetalol (NORMODYNE) 200 MG tablet Take 2 tablets (400 mg total) by mouth daily. Patient taking differently: Take 400 mg by mouth 2 (two) times daily.  06/15/14  Yes Scott  Joylene Draft, PA-C  meclizine (ANTIVERT) 50 MG tablet Take 0.5 tablets (25 mg total) by mouth 4 (four) times daily as needed for dizziness. 11/01/14  Yes Virgel Manifold, MD  nitroGLYCERIN (NITRODUR - DOSED IN MG/24 HR) 0.4 mg/hr patch Place 1 patch (0.4 mg total) onto the skin daily. 05/03/14  Yes Orson Eva, MD  oxyCODONE-acetaminophen (PERCOCET) 5-325 MG per tablet Take 1-2 tablets by mouth every 6 (six) hours as needed for severe pain. 11/08/14  Yes Sherwood Gambler, MD  polyethylene glycol (MIRALAX / GLYCOLAX) packet Take 17 g by mouth 3 (three) times daily as needed. 11/01/14  Yes Virgel Manifold, MD  spironolactone (ALDACTONE) 25 MG tablet 1/2 tablet (12.5mg ) daily 08/02/14  Yes Larey Dresser,  MD  levofloxacin (LEVAQUIN) 500 MG tablet Take 1 tablet (500 mg total) by mouth daily. Patient not taking: Reported on 11/08/2014 11/01/14   Virgel Manifold, MD  minoxidil (LONITEN) 2.5 MG tablet Take 1 tablet (2.5 mg total) by mouth 2 (two) times daily. Patient not taking: Reported on 11/01/2014 06/15/14   Liliane Shi, PA-C   BP 142/75 mmHg  Pulse 77  Temp(Src) 98.4 F (36.9 C) (Oral)  Resp 27  Ht 5\' 11"  (1.803 m)  Wt 143 lb (64.864 kg)  BMI 19.95 kg/m2  SpO2 100% Physical Exam  Constitutional: He is oriented to person, place, and time. He appears well-developed and well-nourished. No distress.  HENT:  Head: Normocephalic and atraumatic.  Eyes: Conjunctivae and EOM are normal.  Neck: Normal range of motion.  Cardiovascular: Normal rate and regular rhythm.  Exam reveals no gallop and no friction rub.   No murmur heard. No lower extremity edema or calf tenderness to palpation.   Pulmonary/Chest: Effort normal and breath sounds normal. He has no wheezes. He has no rales. He exhibits no tenderness.  Abdominal: Soft. He exhibits no distension. There is tenderness. There is no rebound and no guarding.    Mid abdominal tenderness to palpation. No peritoneal signs or other focal tenderness.   Musculoskeletal:  Normal range of motion.  Neurological: He is alert and oriented to person, place, and time. Coordination normal.  Speech is goal-oriented. Moves limbs without ataxia.   Skin: Skin is warm and dry.  Psychiatric: He has a normal mood and affect. His behavior is normal.  Nursing note and vitals reviewed.   ED Course  Procedures (including critical care time) Labs Review Labs Reviewed  CBC WITH DIFFERENTIAL/PLATELET - Abnormal; Notable for the following:    RBC 3.11 (*)    Hemoglobin 8.1 (*)    HCT 26.6 (*)    RDW 15.9 (*)    All other components within normal limits  BASIC METABOLIC PANEL - Abnormal; Notable for the following:    Glucose, Bld 137 (*)    Calcium 8.7 (*)    All other components within normal limits  CBC  I-STAT TROPOININ, ED    Imaging Review Dg Chest 2 View  11/15/2014   CLINICAL DATA:  Chest pain, fever, cough. History of thoracic aortic stent graft.  EXAM: CHEST  2 VIEW  COMPARISON:  11/08/2014  FINDINGS: The aortic stent graft appears stable in position. The heart is normal in size. The pulmonary hila appear normal. The left pleural effusion has resolved. No edema or infiltrates.  IMPRESSION: No acute cardiopulmonary findings.  Stable postoperative changes with thoracic aortic stent graft.   Electronically Signed   By: Marijo Sanes M.D.   On: 11/15/2014 07:27   Ct Angio Chest Pe W/cm &/or Wo Cm  11/15/2014   CLINICAL DATA:  44 year old with chest and abdominal pain. History of thoracic aortic stent graft placement and aortic valve replacement in July 2016. Swelling in the right groin. History of aortic dissection.  EXAM: CT ANGIOGRAPHY CHEST, ABDOMEN AND PELVIS  TECHNIQUE: Multidetector CT imaging through the chest, abdomen and pelvis was performed using the standard protocol during bolus administration of intravenous contrast. Multiplanar reconstructed images and MIPs were obtained and reviewed to evaluate the vascular anatomy.  CONTRAST:  123mL OMNIPAQUE IOHEXOL 350  MG/ML SOLN  COMPARISON:  11/08/2014  FINDINGS: CTA CHEST FINDINGS  Postsurgical changes include aortic valve replacement with an aortic stent graft involving the ascending thoracic aorta, aortic arch and descending thoracic  aorta. There is a surgical graft originating from the ascending thoracic aorta just proximal to the stent graft. The surgical graft is patent and supplies blood flow to the great vessels. Aortic root at sinuses of Valsalva measure up to 4.3 cm and essentially unchanged. There is flow in the main left and right coronary arteries. The aortic stent graft is widely patent. There is flow in bilateral proximal vertebral arteries. The dissection involving the descending thoracic aorta is difficult to evaluate due to poor opacification of the false lumen.Descending thoracic aorta measures 5.3 x 4.3 cm on sequence 4, image 66, previously measured 4.3 x 5.2 cm in a similar level. There appears to be retrograde filling of the dissection false lumen. The aortic stent graft terminates near the aortic hiatus.  There continues to be a moderate to large sized left pleural effusion with compressive atelectasis. No significant pericardial fluid. No significant chest lymphadenopathy. No evidence for pulmonary embolism.  The trachea and mainstem bronchi are patent. Volume loss and compressive atelectasis in the left lower lobe. Patchy ground-glass densities in the left upper lobe may be related to volume loss. Probable atelectasis and volume loss in the right lower lobe.  Review of the MIP images confirms the above findings.  CTA ABDOMEN AND PELVIS FINDINGS  There is an abdominal aortic dissection with retrograde filling of the thoracic false lumen. The proximal abdominal aorta at the level of the celiac trunk origin measures roughly 4 cm and unchanged. Celiac trunk is fed by the false lumen there is flow artifact versus dissection in the celiac trunk. The SMA is fed by the true lumen and patent. Right renal artery  is supplied by the true lumen and patent. The left renal artery is supplied by the false lumen and patent. There continues to be narrowing at the origin the left renal artery likely related to the configuration of the dissection. The juxtarenal abdominal aorta roughly measures 5.0 x 3.1 cm and minimally changed. The infrarenal abdominal aorta is tortuous. The inferior mesenteric artery is patent. Focal ectasia of the IMA on sequence 4, image 254 is unchanged. Dissection terminates in the infrarenal abdominal aorta. The left iliac arteries and proximal left femoral arteries are patent the with stable atherosclerotic disease involving the external and internal iliac arteries. The right iliac arteries and proximal right femoral arteries are patent. Again noted is a low-density collection superficial to the right common femoral artery that measures 5.0 x 4.4 cm and stable in size.  Evaluation of the solid origins is limited due to the arterial phase of contrast. There is no gross abnormality to the liver or gallbladder. No gross abnormality to the spleen or pancreas. Adrenal glands are not well visualized. No gross abnormality to the kidneys. There is symmetric profusion to both kidneys. There appears to be fluid in the lower abdomen and pelvis. Very difficult to evaluate the bowel structures due to the lack of fat and the timing of the contrast. There is a large amount of stool in the colon. Difficult to exclude bowel wall thickening particularly in the sigmoid colon on sequence 4, image 268. There are small irregular glass collections in the left lower abdomen on sequence 4, image 240 which are indeterminate. It is unclear if these are intraluminal. No other clear evidence for pneumoperitoneum.  No acute bone abnormality. There is a stable small lucency involving the right ilium along the right side of the SI joint. This is nonspecific and probably incidental finding.  Review of the MIP  images confirms the above  findings.  IMPRESSION: Stable postsurgical changes in the chest. The surgical graft and aortic stent graft are patent. There is no significant change in the size of the descending thoracic aorta although difficult to measure due to poor visualization of the false lumen in the upper chest.  No evidence for pulmonary embolism.  Stable moderate to large sized left pleural effusion. Evidence for atelectasis throughout the lung, particularly in the left lower lobe.  Stable configuration of the aortic dissection in the abdomen which terminates in the infrarenal abdominal aorta.  There appears to be fluid in the lower abdomen and pelvis of uncertain etiology. Limited evaluation of the small and large bowel structures. There is concern for possible bowel wall thickening involving the sigmoid colon. Questionable gas collections in the left lower quadrant. Further evaluation of the distal colon could be performed with a water-soluble barium enema. Alternatively, further evaluation of the abdomen and pelvis could be performed with a repeat CT during the portal venous phase of contrast.  There is a stable low-density collection superficial to the right common femoral artery. Suspect this represents a postoperative fluid collection.  These results were called by telephone at the time of interpretation on 11/15/2014 at 9:48 am to [facility] , who verbally acknowledged these results.   Electronically Signed   By: Markus Daft M.D.   On: 11/15/2014 09:48   Ct Cta Abd/pel W/cm &/or W/o Cm  11/15/2014   CLINICAL DATA:  44 year old with chest and abdominal pain. History of thoracic aortic stent graft placement and aortic valve replacement in July 2016. Swelling in the right groin. History of aortic dissection.  EXAM: CT ANGIOGRAPHY CHEST, ABDOMEN AND PELVIS  TECHNIQUE: Multidetector CT imaging through the chest, abdomen and pelvis was performed using the standard protocol during bolus administration of intravenous contrast.  Multiplanar reconstructed images and MIPs were obtained and reviewed to evaluate the vascular anatomy.  CONTRAST:  173mL OMNIPAQUE IOHEXOL 350 MG/ML SOLN  COMPARISON:  11/08/2014  FINDINGS: CTA CHEST FINDINGS  Postsurgical changes include aortic valve replacement with an aortic stent graft involving the ascending thoracic aorta, aortic arch and descending thoracic aorta. There is a surgical graft originating from the ascending thoracic aorta just proximal to the stent graft. The surgical graft is patent and supplies blood flow to the great vessels. Aortic root at sinuses of Valsalva measure up to 4.3 cm and essentially unchanged. There is flow in the main left and right coronary arteries. The aortic stent graft is widely patent. There is flow in bilateral proximal vertebral arteries. The dissection involving the descending thoracic aorta is difficult to evaluate due to poor opacification of the false lumen.Descending thoracic aorta measures 5.3 x 4.3 cm on sequence 4, image 66, previously measured 4.3 x 5.2 cm in a similar level. There appears to be retrograde filling of the dissection false lumen. The aortic stent graft terminates near the aortic hiatus.  There continues to be a moderate to large sized left pleural effusion with compressive atelectasis. No significant pericardial fluid. No significant chest lymphadenopathy. No evidence for pulmonary embolism.  The trachea and mainstem bronchi are patent. Volume loss and compressive atelectasis in the left lower lobe. Patchy ground-glass densities in the left upper lobe may be related to volume loss. Probable atelectasis and volume loss in the right lower lobe.  Review of the MIP images confirms the above findings.  CTA ABDOMEN AND PELVIS FINDINGS  There is an abdominal aortic dissection with retrograde filling of  the thoracic false lumen. The proximal abdominal aorta at the level of the celiac trunk origin measures roughly 4 cm and unchanged. Celiac trunk is fed by  the false lumen there is flow artifact versus dissection in the celiac trunk. The SMA is fed by the true lumen and patent. Right renal artery is supplied by the true lumen and patent. The left renal artery is supplied by the false lumen and patent. There continues to be narrowing at the origin the left renal artery likely related to the configuration of the dissection. The juxtarenal abdominal aorta roughly measures 5.0 x 3.1 cm and minimally changed. The infrarenal abdominal aorta is tortuous. The inferior mesenteric artery is patent. Focal ectasia of the IMA on sequence 4, image 254 is unchanged. Dissection terminates in the infrarenal abdominal aorta. The left iliac arteries and proximal left femoral arteries are patent the with stable atherosclerotic disease involving the external and internal iliac arteries. The right iliac arteries and proximal right femoral arteries are patent. Again noted is a low-density collection superficial to the right common femoral artery that measures 5.0 x 4.4 cm and stable in size.  Evaluation of the solid origins is limited due to the arterial phase of contrast. There is no gross abnormality to the liver or gallbladder. No gross abnormality to the spleen or pancreas. Adrenal glands are not well visualized. No gross abnormality to the kidneys. There is symmetric profusion to both kidneys. There appears to be fluid in the lower abdomen and pelvis. Very difficult to evaluate the bowel structures due to the lack of fat and the timing of the contrast. There is a large amount of stool in the colon. Difficult to exclude bowel wall thickening particularly in the sigmoid colon on sequence 4, image 268. There are small irregular glass collections in the left lower abdomen on sequence 4, image 240 which are indeterminate. It is unclear if these are intraluminal. No other clear evidence for pneumoperitoneum.  No acute bone abnormality. There is a stable small lucency involving the right ilium  along the right side of the SI joint. This is nonspecific and probably incidental finding.  Review of the MIP images confirms the above findings.  IMPRESSION: Stable postsurgical changes in the chest. The surgical graft and aortic stent graft are patent. There is no significant change in the size of the descending thoracic aorta although difficult to measure due to poor visualization of the false lumen in the upper chest.  No evidence for pulmonary embolism.  Stable moderate to large sized left pleural effusion. Evidence for atelectasis throughout the lung, particularly in the left lower lobe.  Stable configuration of the aortic dissection in the abdomen which terminates in the infrarenal abdominal aorta.  There appears to be fluid in the lower abdomen and pelvis of uncertain etiology. Limited evaluation of the small and large bowel structures. There is concern for possible bowel wall thickening involving the sigmoid colon. Questionable gas collections in the left lower quadrant. Further evaluation of the distal colon could be performed with a water-soluble barium enema. Alternatively, further evaluation of the abdomen and pelvis could be performed with a repeat CT during the portal venous phase of contrast.  There is a stable low-density collection superficial to the right common femoral artery. Suspect this represents a postoperative fluid collection.  These results were called by telephone at the time of interpretation on 11/15/2014 at 9:48 am to [facility] , who verbally acknowledged these results.   Electronically Signed   By:  Markus Daft M.D.   On: 11/15/2014 09:48   I have personally reviewed and evaluated these images and lab results as part of my medical decision-making.   EKG Interpretation None      MDM   Final diagnoses:  Chest pain, unspecified chest pain type  Abdominal pain, unspecified abdominal location    7:19 AM Labs and chest xray pending. Patient will have morphine and  zofran for pain. Vitals stable and patient afebrile.   Imaging shows stable post surgical changes. No acute PE. Patient's pain controlled here in the ED. Patient advised to follow up with his doctor at [facility]. Patient will have Pulmonology follow up for [facility] area. Patient discharged with Percocet and zofran. Vitals stable and patient afebrile.    Alvina Chou, PA-C 11/15/14 [place], MD 11/16/14 [phone]

## 2014-11-15 NOTE — ED Notes (Signed)
MD at bedside. Steinl 

## 2014-11-15 NOTE — ED Notes (Signed)
Pt c/o 10/10 CP and abdominal pain and SOB, lung clear on ascultation, NAD noticed.

## 2014-11-15 NOTE — ED Notes (Signed)
Patient transported to X-ray 

## 2014-11-15 NOTE — Discharge Instructions (Signed)
Take percocet as needed for pain. Take zofran as needed for nausea. Follow up with your doctor at [facility]. Follow up with the recommended lung doctors here. Return to the ED with worsening or concerning symptoms.

## 2014-11-19 ENCOUNTER — Emergency Department (HOSPITAL_COMMUNITY)
Admission: EM | Admit: 2014-11-19 | Discharge: 2014-11-19 | Disposition: A | Discharge status: Home / Self Care | Payer: Medicare Other | Attending: Emergency Medicine | Admitting: Emergency Medicine

## 2014-11-19 ENCOUNTER — Emergency Department (HOSPITAL_COMMUNITY): Payer: Medicare Other

## 2014-11-19 ENCOUNTER — Encounter (HOSPITAL_COMMUNITY): Payer: Self-pay | Admitting: Emergency Medicine

## 2014-11-19 DIAGNOSIS — R0602 Shortness of breath: Secondary | ICD-10-CM | POA: Insufficient documentation

## 2014-11-19 DIAGNOSIS — K219 Gastro-esophageal reflux disease without esophagitis: Secondary | ICD-10-CM | POA: Diagnosis not present

## 2014-11-19 DIAGNOSIS — I129 Hypertensive chronic kidney disease with stage 1 through stage 4 chronic kidney disease, or unspecified chronic kidney disease: Secondary | ICD-10-CM | POA: Diagnosis not present

## 2014-11-19 DIAGNOSIS — Z79899 Other long term (current) drug therapy: Secondary | ICD-10-CM | POA: Diagnosis not present

## 2014-11-19 DIAGNOSIS — Z8701 Personal history of pneumonia (recurrent): Secondary | ICD-10-CM | POA: Insufficient documentation

## 2014-11-19 DIAGNOSIS — F419 Anxiety disorder, unspecified: Secondary | ICD-10-CM | POA: Insufficient documentation

## 2014-11-19 DIAGNOSIS — Z720 Tobacco use: Secondary | ICD-10-CM | POA: Insufficient documentation

## 2014-11-19 DIAGNOSIS — N189 Chronic kidney disease, unspecified: Secondary | ICD-10-CM | POA: Diagnosis not present

## 2014-11-19 DIAGNOSIS — I5042 Chronic combined systolic (congestive) and diastolic (congestive) heart failure: Secondary | ICD-10-CM | POA: Insufficient documentation

## 2014-11-19 DIAGNOSIS — R079 Chest pain, unspecified: Secondary | ICD-10-CM | POA: Diagnosis not present

## 2014-11-19 DIAGNOSIS — R0789 Other chest pain: Secondary | ICD-10-CM | POA: Diagnosis not present

## 2014-11-19 DIAGNOSIS — E785 Hyperlipidemia, unspecified: Secondary | ICD-10-CM | POA: Diagnosis not present

## 2014-11-19 LAB — COMPREHENSIVE METABOLIC PANEL
ALK PHOS: 171 U/L — AB (ref 38–126)
ALT: 17 U/L (ref 17–63)
ANION GAP: 8 (ref 5–15)
AST: 24 U/L (ref 15–41)
Albumin: 2.6 g/dL — ABNORMAL LOW (ref 3.5–5.0)
BUN: 9 mg/dL (ref 6–20)
CALCIUM: 9 mg/dL (ref 8.9–10.3)
CHLORIDE: 108 mmol/L (ref 101–111)
CO2: 20 mmol/L — AB (ref 22–32)
Creatinine, Ser: 1.17 mg/dL (ref 0.61–1.24)
GFR calc non Af Amer: >60 mL/min (ref >60)
Glucose, Bld: 95 mg/dL (ref 65–99)
POTASSIUM: 4.6 mmol/L (ref 3.5–5.1)
SODIUM: 136 mmol/L (ref 135–145)
Total Bilirubin: 0.7 mg/dL (ref 0.3–1.2)
Total Protein: 7 g/dL (ref 6.5–8.1)

## 2014-11-19 LAB — LIPASE, BLOOD: Lipase: 19 U/L — ABNORMAL LOW (ref 22–51)

## 2014-11-19 LAB — CBC
HCT: 29.7 % — ABNORMAL LOW (ref 39.0–52.0)
Hemoglobin: 9.2 g/dL — ABNORMAL LOW (ref 13.0–17.0)
MCH: 26.8 pg (ref 26.0–34.0)
MCHC: 31 g/dL (ref 30.0–36.0)
MCV: 86.6 fL (ref 78.0–100.0)
PLATELETS: 304 10*3/uL (ref 150–400)
RBC: 3.43 MIL/uL — ABNORMAL LOW (ref 4.22–5.81)
RDW: 16.3 % — AB (ref 11.5–15.5)
WBC: 8.9 10*3/uL (ref 4.0–10.5)

## 2014-11-19 LAB — I-STAT TROPONIN, ED
TROPONIN I, POC: 0 ng/mL (ref 0.00–0.08)
TROPONIN I, POC: 0.02 ng/mL (ref 0.00–0.08)

## 2014-11-19 MED ORDER — HYDROMORPHONE HCL 1 MG/ML IJ SOLN
1.0000 mg | Freq: Once | INTRAMUSCULAR | Status: AC
  Administered 2014-11-19: 1 mg via INTRAVENOUS
  Filled 2014-11-19: qty 1

## 2014-11-19 MED ORDER — CLONIDINE HCL 0.2 MG PO TABS
0.2000 mg | ORAL_TABLET | Freq: Once | ORAL | Status: AC
  Administered 2014-11-19: 0.2 mg via ORAL
  Filled 2014-11-19: qty 1

## 2014-11-19 MED ORDER — ONDANSETRON HCL 4 MG/2ML IJ SOLN
4.0000 mg | Freq: Once | INTRAMUSCULAR | Status: AC
  Administered 2014-11-19: 4 mg via INTRAVENOUS
  Filled 2014-11-19: qty 2

## 2014-11-19 MED ORDER — OXYCODONE-ACETAMINOPHEN 5-325 MG PO TABS: 1.0000 | ORAL_TABLET | Freq: Four times a day (QID) | ORAL | Status: DC | PRN

## 2014-11-19 NOTE — Discharge Instructions (Signed)

## 2014-11-19 NOTE — ED Provider Notes (Signed)
CSN: [id number]     Arrival date & time 11/19/14  0821 History   First MD Initiated Contact with Patient 11/19/14 [phone]     Chief Complaint  Patient presents with  . Chest Pain  . Shortness of Breath     Patient is a 44 y.o. male presenting with chest pain and shortness of breath. The history is provided by the patient. No language interpreter was used.  Chest Pain Associated symptoms: shortness of breath   Shortness of Breath Associated symptoms: chest pain    Mr. Magnotta presents for evaluation of chest pain and SOB.  He has aortic aneurysm surgery in July of this year at [facility].  He has experienced chronic SOB since that time.  For the last two days he reports increased central chest pain.  Pain is constant, on the right side it is ripping and tearing in sensation and left side is a pulling sensation.  No new injuries.  No cough, lower extremity edema, abdominal pain, vomiting.  Sxs are severe, constant, worsening.    Past Medical History  Diagnosis Date  . Hypertension     a. Hx of HTN urgency secondary to noncompliance. b. urinary metanephrine and catecholeamine levels normal 2013.  [name] Anxiety   . GERD (gastroesophageal reflux disease)   . Acute renal failure     June 2012 felt secondary to Toradol  . Chronic renal failure   . Chronic combined systolic and diastolic congestive heart failure     a. 05/2011: Adm with pulm edema/HTN urgency, EF 35-40% with diffuse hypokinesis and moderate to severe mitral regurgitation. Cardiomyopathy likely due to uncontrolled HTN and ETOH abuse - cath deferred due to renal insufficiency (felt due to uncontrolled HTN). bJodie Echevaria MV 06/2011: EF 37% and no ischemia or infarction. c. EF 45-50% by echo 01/2012.  . Cardiomyopathy     non-ischemic - probably related to untreated HTN and ETOH abuse  . HYPERLIPIDEMIA   . INGUINAL HERNIA   . ETOH abuse     a. Reported to have quit 05/2011.  . Valvular heart disease     a. Echo 05/2011: moderate to severe  eccentric MR and mild to moderate AI with prolapsing left coronary cusp. b. Echo 01/2012: mild-mod AI, mild dilitation of aortic root, mild MR.;  b. 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 CKD (chronic kidney disease)     a. 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  peak creatinine 3.46 during admx for aortic dissection 2/16  . Tobacco abuse   . Pneumonia ~ 2013  . Headache(784.0)     "q other day" (08/08/2013)  . Chronic sinusitis   . History of medication noncompliance   . Chronic abdominal pain   . DDD (degenerative disc disease), lumbar   . Aortic dissection     a. admx 04/2014 >> L renal infarct; a/c renal failure >> VVS to re-eval 05/2014 to decide +/- surgery  . Aortic dissection   . Collagen vascular disease    Past Surgical History  Procedure Laterality Date  . Ankle surgery      Fractures bilaterally  . Inguinal hernia repair Right ~ 1996  . Foot fracture surgery Bilateral 2004-2010    "got pins in both of them"  . Left heart catheterization with coronary angiogram N/A 06/21/2014    Procedure: LEFT HEART CATHETERIZATION WITH CORONARY ANGIOGRAM;  Surgeon: Larey Dresser, MD;  Location: [place] CATH LAB;  Service: Cardiovascular;  Laterality: N/A;  . Aortic valve surgery     Family History  Problem Relation Age of Onset  . Hypertension    . Colon cancer Paternal Uncle   . Stroke Maternal Aunt   . Heart attack Brother   . Diabetes Maternal Aunt   . Throat cancer Neg Hx   . Pancreatic cancer Neg Hx   . Esophageal cancer Neg Hx   . Kidney disease Neg Hx   . Liver disease Neg Hx    Social History  Substance Use Topics  . Smoking status: Current Every Day Smoker -- 0.25 packs/day for 23 years    Types: Cigarettes    Last Attempt to Quit: 05/03/2014  . Smokeless tobacco: Former Systems developer    Types: Chew     Comment: as of 05-22-14 down to 3-4 cigs daily  . Alcohol Use: No     Comment: +  previous use    Review of Systems  Respiratory: Positive for shortness of breath.   Cardiovascular: Positive for chest pain.  All other systems reviewed and are negative.     Allergies  Imdur and Tramadol  Home Medications   Prior to Admission medications   Medication Sig Start Date End Date Taking? Authorizing Provider  acetaminophen (TYLENOL) 500 MG tablet Take 1,500 mg by mouth 2 (two) times daily as needed for moderate pain.    Historical Provider, MD  atorvastatin (LIPITOR) 20 MG tablet Take 20 mg by mouth daily. 11/14/14   Historical Provider, MD  cloNIDine (CATAPRES) 0.2 MG tablet Take 1 tablet (0.2 mg total) by mouth 3 (three) times daily. Patient taking differently: Take 0.2 mg by mouth 2 (two) times daily.  05/03/14   Orson Eva, MD  docusate sodium (COLACE) 100 MG capsule Take 1 capsule (100 mg total) by mouth every 12 (twelve) hours. 11/08/14   Sherwood Gambler, MD  furosemide (LASIX) 20 MG tablet Take 20 mg by mouth daily.    Historical Provider, MD  gabapentin (NEURONTIN) 100 MG capsule Take 200 mg by mouth 2 (two) times daily.    Historical Provider, MD  hydrALAZINE (APRESOLINE) 100 MG tablet Take 1 tablet (100 mg total) by mouth 3 (three) times daily. Patient taking differently: Take 200 mg by mouth 3 (three) times daily.  05/03/14   Orson Eva, MD  labetalol (NORMODYNE) 200 MG tablet Take 2 tablets (400 mg total) by mouth daily. Patient taking differently: Take 400 mg by mouth 2 (two) times daily.  06/15/14   Liliane Shi, PA-C  levofloxacin (LEVAQUIN) 500 MG tablet Take 1 tablet (500 mg total) by mouth daily. Patient not taking: Reported on 11/08/2014 11/01/14   Virgel Manifold, MD  meclizine (ANTIVERT) 50 MG tablet Take 0.5 tablets (25 mg total) by mouth 4 (four) times daily as needed for dizziness. 11/01/14   Virgel Manifold, MD  minoxidil (LONITEN) 2.5 MG tablet Take 1 tablet (2.5 mg total) by mouth 2 (two) times daily. Patient not taking: Reported on 11/01/2014 06/15/14   Liliane Shi, PA-C  nitroGLYCERIN (NITRODUR - DOSED IN MG/24 HR) 0.4 mg/hr patch Place 1 patch (0.4 mg total) onto the skin daily. 05/03/14   Orson Eva, MD  ondansetron (ZOFRAN ODT) 4 MG disintegrating tablet Take 1 tablet (4 mg total) by mouth every 8 (eight) hours as needed for nausea or vomiting. 11/15/14   Alvina Chou, PA-C  oxyCODONE-acetaminophen (PERCOCET/ROXICET) 5-325 MG per tablet Take 1-2 tablets by mouth every 4 (four)  hours as needed for severe pain. 11/15/14   Kaitlyn Szekalski, PA-C  polyethylene glycol (MIRALAX / GLYCOLAX) packet Take 17 g by mouth 3 (three) times daily as needed. 11/01/14   Virgel Manifold, MD  spironolactone (ALDACTONE) 25 MG tablet 1/2 tablet (12.5mg ) daily 08/02/14   Larey Dresser, MD   BP 189/100 mmHg  Pulse 70  Temp(Src) 98.3 F (36.8 C) (Oral)  Resp 38  SpO2 100% Physical Exam  Constitutional: He is oriented to person, place, and time. He appears well-developed and well-nourished.  HENT:  Head: Normocephalic and atraumatic.  Cardiovascular: Normal rate and regular rhythm.   SEM at LSB  Pulmonary/Chest: Effort normal and breath sounds normal. No respiratory distress.  Abdominal: Soft. There is no rebound and no guarding.  Moderate epigastric tenderness  Musculoskeletal: He exhibits no edema or tenderness.  Neurological: He is alert and oriented to person, place, and time.  Skin: Skin is warm and dry.  Psychiatric: He has a normal mood and affect. His behavior is normal.  Nursing note and vitals reviewed.   ED Course  Procedures (including critical care time) Labs Review Labs Reviewed  CBC - Abnormal; Notable for the following:    RBC 3.43 (*)    Hemoglobin 9.2 (*)    HCT 29.7 (*)    RDW 16.3 (*)    All other components within normal limits  COMPREHENSIVE METABOLIC PANEL - Abnormal; Notable for the following:    CO2 20 (*)    Albumin 2.6 (*)    Alkaline Phosphatase 171 (*)    All other components within normal limits  LIPASE, BLOOD -  Abnormal; Notable for the following:    Lipase 19 (*)    All other components within normal limits  I-STAT TROPOININ, ED  I-STAT TROPOININ, ED    Imaging Review Dg Chest 1 View  11/19/2014   CLINICAL DATA:  Chest pain and shortness of breath for 3 days. History of stent and aortic valve surgery.  EXAM: CHEST  1 VIEW  COMPARISON:  11/15/2014; 11/08/2014; chest CT - 11/15/2014  FINDINGS: Grossly unchanged cardiac silhouette and mediastinal contours post median sternotomy, CABG and valve replacement. Post endovascular repair of thoracic aortic aneurysm. No focal airspace opacities. No pleural effusion or pneumothorax. No evidence of edema. Surgical clips overlie the right axilla. No acute osseous abnormalities.  IMPRESSION: Stable extensive postsurgical change of the chest without acute cardiopulmonary disease.   Electronically Signed   By: Sandi Mariscal M.D.   On: 11/19/2014 09:27   I have personally reviewed and evaluated these images and lab results as part of my medical decision-making.   EKG Interpretation   Date/Time:  Monday November 19 2014 08:46:29 EDT Ventricular Rate:  80 PR Interval:  163 QRS Duration: 98 QT Interval:  408 QTC Calculation: 471 R Axis:   -30 Text Interpretation:  Sinus rhythm Left atrial enlargement Left axis  deviation Repol abnrm suggests ischemia, lateral leads Baseline wander in  lead(s) V1 No significant change since last tracing Confirmed by Hazle Coca  [id number]) on 11/19/2014 9:45:17 AM      MDM   Final diagnoses:  Chest pain, unspecified chest pain type    Pt with hx/o aortic aneurysm repair here with chest pain, multiple prior evals since surgery with similar sxs, multiple prior CTA chest performed with stable aneurysm s/p repair identified.  Pt hypertensive on ED arrival - improved after pain meds and home BP meds.  Presentation not c/w ACS, recurrent dissection, hypertensive urgency, PE.  Discussed with pt home care and very close return precautions.       Quintella Reichert, MD 11/19/14 [phone]

## 2014-11-19 NOTE — ED Notes (Signed)
Pt reports tearing chest pain for the last few days. Reports no relief with percocet. Also states has been feeling SOb since aortic valve surgery at [facility] back in July. Pt has multiple other bodyaches and complaints. Lungs sounds CTA.

## 2014-11-19 NOTE — ED Notes (Signed)
Patient transported to X-ray 

## 2014-11-21 IMAGING — US US ART/VEN ABD/PELV/SCROTUM DOPPLER LTD
1 series · 13 of 25 positions shown · non-contrast
Comparison: None
Correlation:  CT pelvis 01/24/2012

CLINICAL DATA: Left testicular pain

SCROTAL ULTRASOUND
DOPPLER ULTRASOUND OF THE TESTICLES
TECHNIQUE: Complete ultrasound examination of the testicles,
epididymis, and other scrotal structures was performed.  Color and
spectral Doppler ultrasound were also utilized to evaluate blood
flow to the testicles.

[Series 1: us art/ven abd/pelv/scrotum doppler ltd · 0.08mm/px · 13 of 68 slices shown]
[im 1/68]
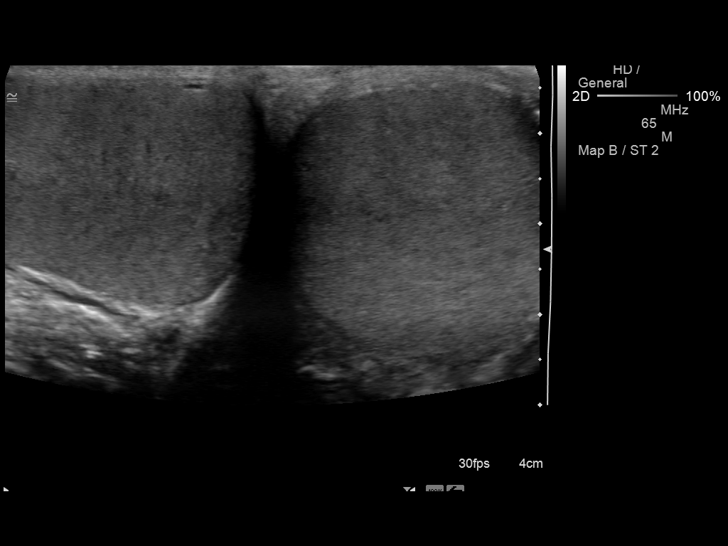
[im 6/68]
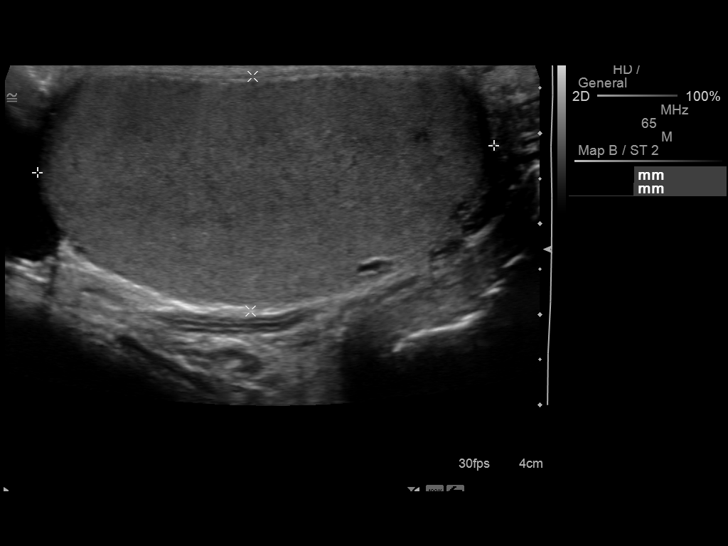
[im 12/68]
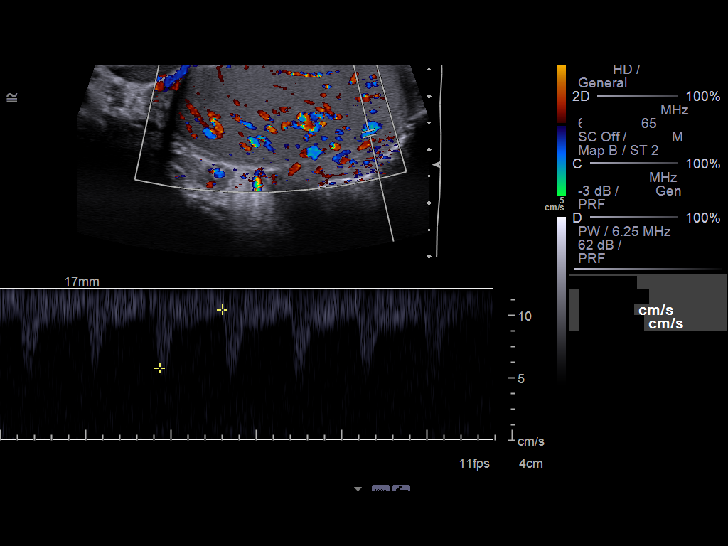
[im 17/68]
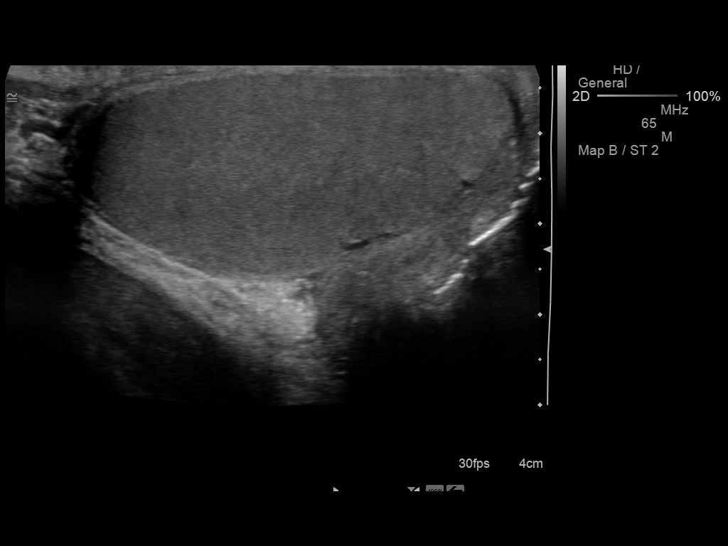
[im 23/68]
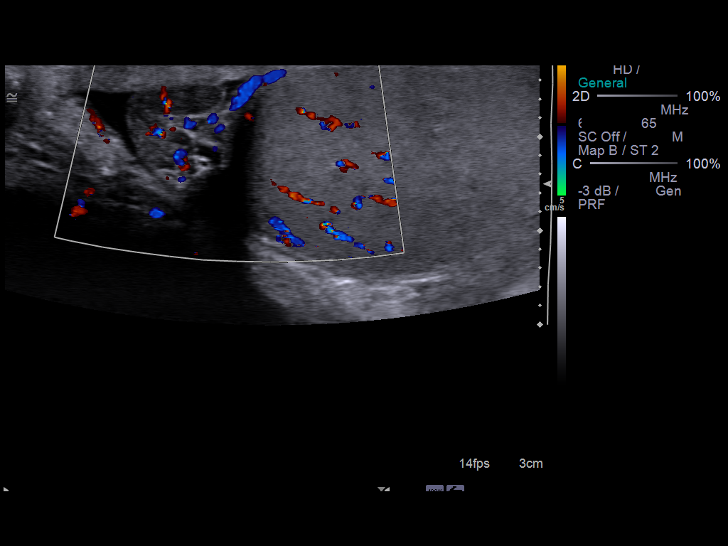
[im 28/68]
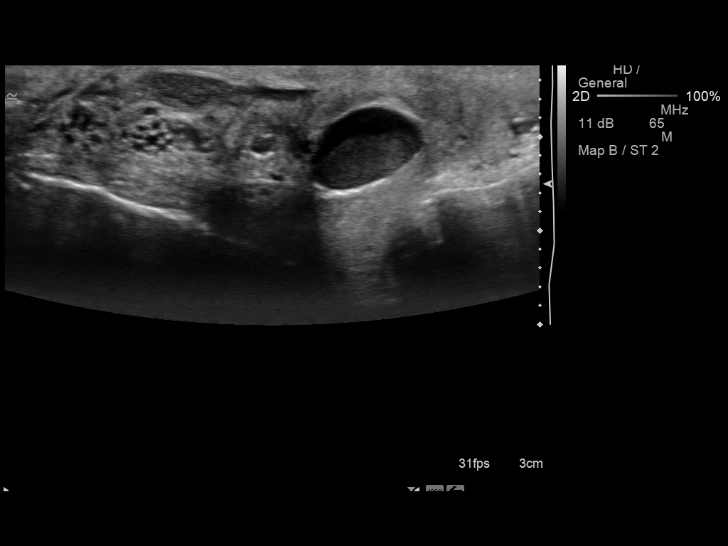
[im 34/68]
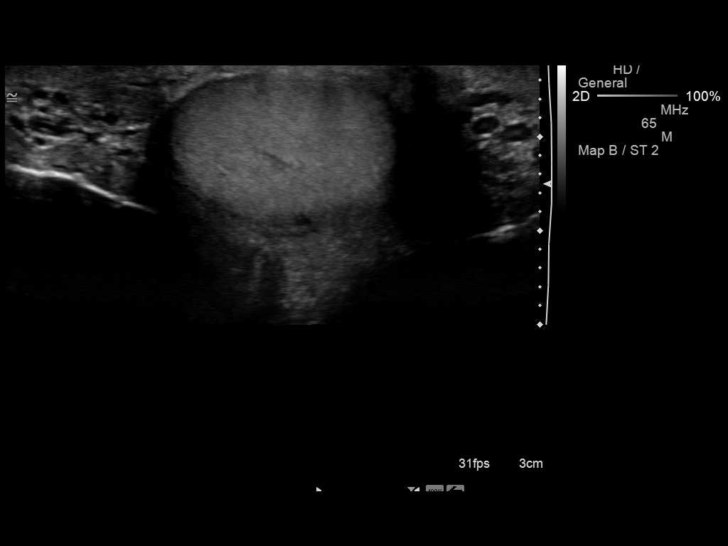
[im 40/68]
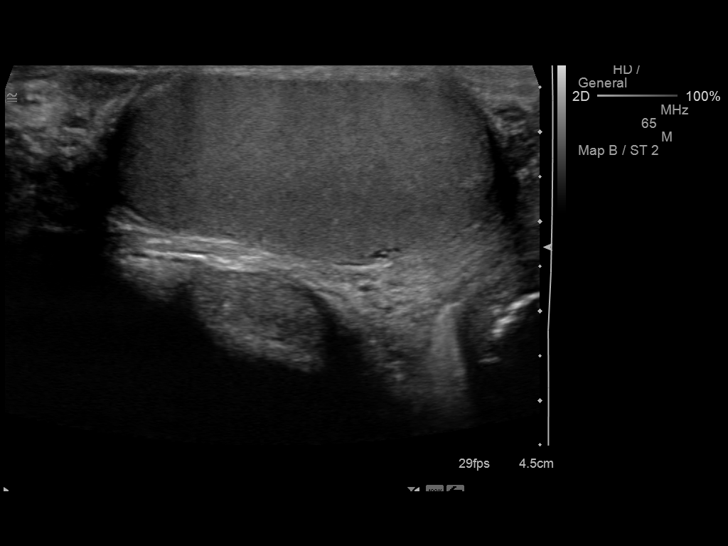
[im 45/68]
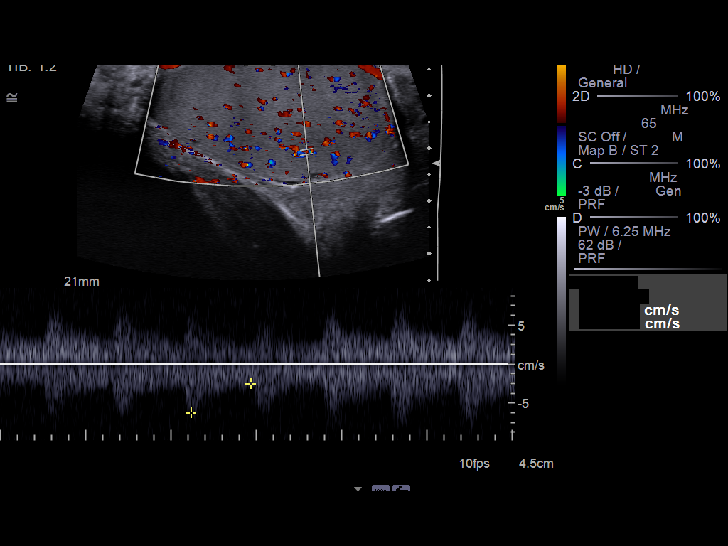
[im 51/68]
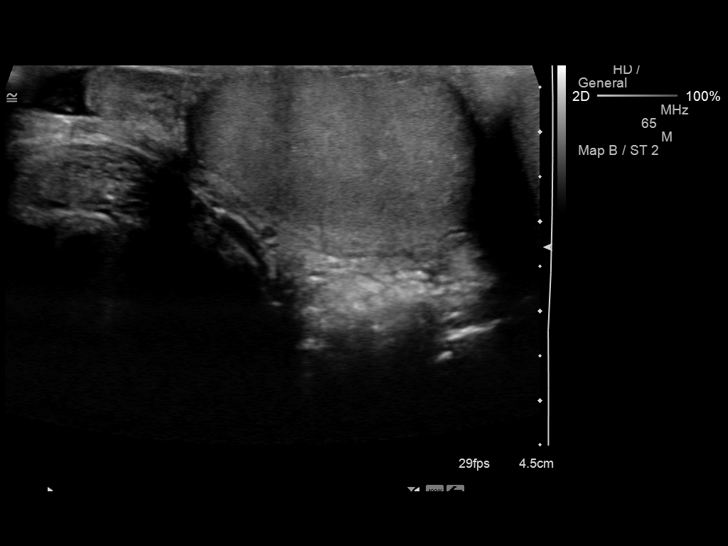
[im 56/68]
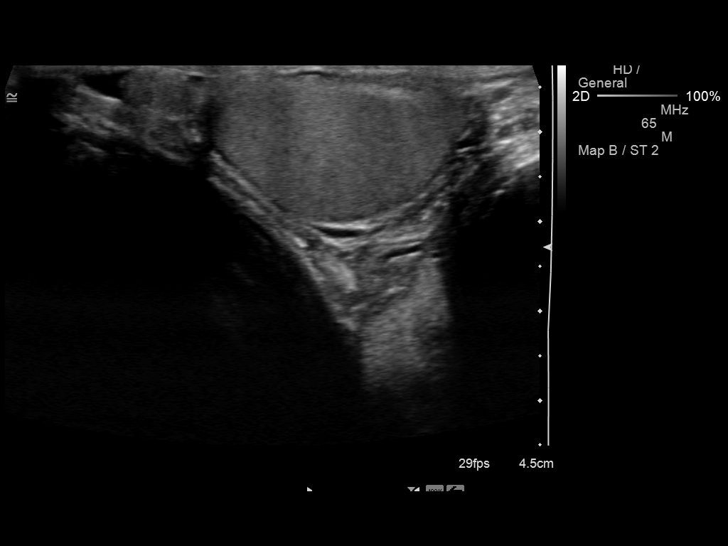
[im 62/68]
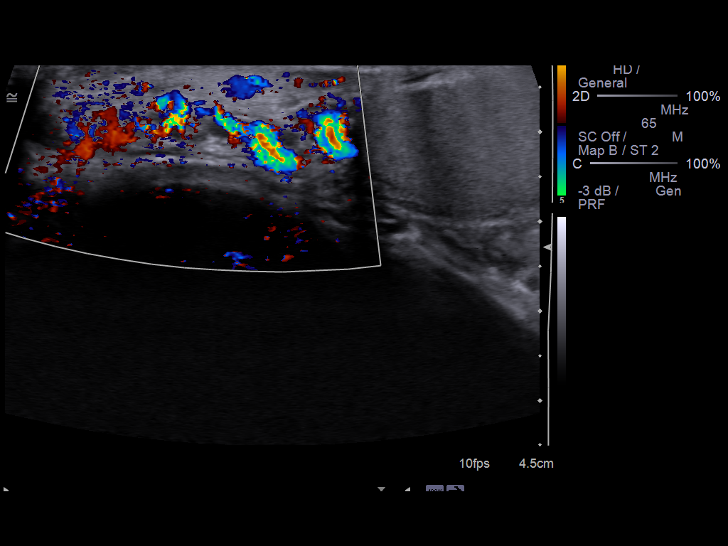
[im 68/68]
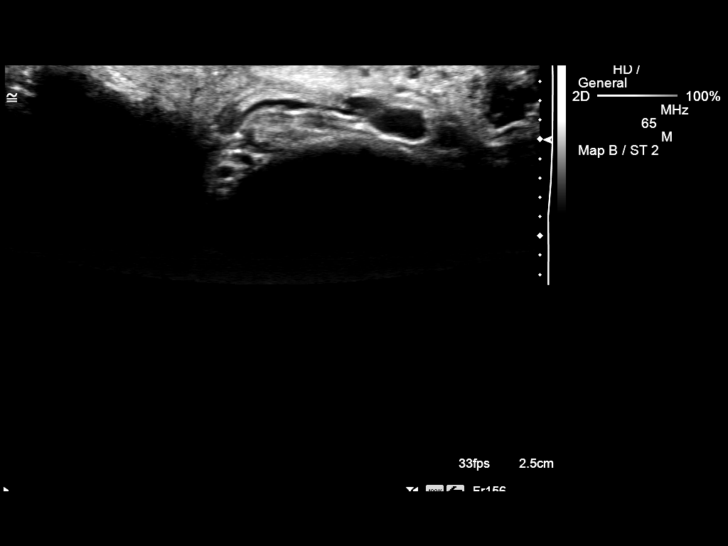

[13 of 25 positions shown; findings below may reference images not displayed]

FINDINGS: Right testis:  5.1 x 2.6 x 3.9 cm.  Normal morphology without mass
or calcification.  Internal blood flow present on color Doppler
imaging.

Left testis:  4.4 x 2.0 x 3.8 cm.  Normal morphology without mass
or calcification.  Internal blood flow present on color Doppler
imaging.

Right epididymis:  Complicated cyst 12 x 8 x 10 mm in size
containing internal debris

Left epididymis:  Normal in size and appearance.

Hydrocele:  Absent bilaterally

Varicocele:  Absent bilaterally

Pulsed Doppler interrogation of both testes demonstrates
intratesticular venous and low resistance arterial flow
bilaterally.

Question left inguinal hernia containing fat.
IMPRESSION: No evidence of testicular mass or torsion.
Small complicated cyst right epididymis question epididymal cyst
versus spermatocele.
Question left inguinal hernia containing fat; this was not
identified on the prior CT.

## 2014-11-26 DIAGNOSIS — Z95828 Presence of other vascular implants and grafts: Secondary | ICD-10-CM | POA: Diagnosis not present

## 2014-12-04 DIAGNOSIS — Z9889 Other specified postprocedural states: Secondary | ICD-10-CM | POA: Diagnosis not present

## 2014-12-04 DIAGNOSIS — N183 Chronic kidney disease, stage 3 (moderate): Secondary | ICD-10-CM | POA: Diagnosis not present

## 2014-12-04 DIAGNOSIS — I10 Essential (primary) hypertension: Secondary | ICD-10-CM | POA: Diagnosis not present

## 2014-12-04 DIAGNOSIS — N280 Ischemia and infarction of kidney: Secondary | ICD-10-CM | POA: Diagnosis not present

## 2014-12-06 ENCOUNTER — Encounter: Payer: Self-pay | Admitting: Physician Assistant

## 2014-12-06 ENCOUNTER — Ambulatory Visit (INDEPENDENT_AMBULATORY_CARE_PROVIDER_SITE_OTHER): Payer: Medicare Other | Admitting: Physician Assistant

## 2014-12-06 VITALS — BP 160/100 | HR 82 | Ht 71.0 in | Wt 141.2 lb

## 2014-12-06 DIAGNOSIS — Z8719 Personal history of other diseases of the digestive system: Secondary | ICD-10-CM | POA: Diagnosis not present

## 2014-12-06 DIAGNOSIS — R935 Abnormal findings on diagnostic imaging of other abdominal regions, including retroperitoneum: Secondary | ICD-10-CM

## 2014-12-06 DIAGNOSIS — D649 Anemia, unspecified: Secondary | ICD-10-CM

## 2014-12-06 DIAGNOSIS — R9389 Abnormal findings on diagnostic imaging of other specified body structures: Secondary | ICD-10-CM

## 2014-12-06 DIAGNOSIS — I2510 Atherosclerotic heart disease of native coronary artery without angina pectoris: Secondary | ICD-10-CM | POA: Diagnosis not present

## 2014-12-06 DIAGNOSIS — K5909 Other constipation: Secondary | ICD-10-CM

## 2014-12-06 DIAGNOSIS — R938 Abnormal findings on diagnostic imaging of other specified body structures: Secondary | ICD-10-CM

## 2014-12-06 NOTE — Progress Notes (Signed)
Patient ID: [name], male   DOB: Oct 02, 1970, 45 y.o.   MRN: [id number]   Subjective:    Patient ID: [name], male    DOB: 11/13/1970, 44 y.o.   MRN: [id number]  HPI [name]  is a pleasant 44 year old African-American male who was last seen in our office in May 2016 by myself with complaints of rectal bleeding. He has had a complicated history after diagnosis of a type B aortic dissection with acute renal infarct in February 2016. He was ultimately referred to Dr. [name] at [facility] for repair of his aneurysm. He had several months of fatigue exertional dyspnea ongoing chest and back and abdominal pain until his surgery was done in July 2016. He had aortic stent graft and aortic valve replacement done and says he is feeling much better. He has had several ER visits here in [place] postoperatively with complaints of chest pain and shortness of breath. With one of these evaluations on 11/15/2014 CTA of the chest abdomen and pelvis was done. He was found to have a moderate left effusion with atelectasis table configuration of the aortic dissection in the abdomen which terminates in the infrarenal abdominal aorta stable postsurgical changes in the chest with surgical graft and aortic stent graft patent and appeared to be some fluid in the lower abdomen and pelvis, concern for possible bowel wall thickening involving the sigmoid colon questionable gas collections in the left lower quadrant. It was suggested that further evaluation of the distal colon could be performed with water-soluble barium enema or repeat evaluation of the abdomen and pelvis with CT during portal venous phase of contrast. Today patient says that he feels like he is making definite progress and that each day he feels a little bit better. He still has fatigue and has been anemic postoperatively. He is able to ambulate but does have some exertional dyspnea after a certain distance. As if he stands for a long time he gets some aching in his  chest. He has no current complaints of abdominal pain. His bowel movements are now back to normal and he is not having any problems with constipation. He is off of pain medication. He is also not noticed any rectal bleeding over the past several weeks. He has follow-up with cardiovascular surgeon Dr. [name] at [facility] in October. He has not had any recent cardiology follow-up postoperatively. Echo last done in January 2016 with EF of 35-40% in a patient with known ischemic cardiomyopathy, and chronic kidney disease..  Review of Systems Pertinent positive and negative review of systems were noted in the above HPI section.  All other review of systems was otherwise negative.  Outpatient Encounter Prescriptions as of 12/06/2014  Medication Sig  . acetaminophen (TYLENOL) 500 MG tablet Take 1,500 mg by mouth 2 (two) times daily as needed for moderate pain.  [name] atorvastatin (LIPITOR) 20 MG tablet Take 20 mg by mouth daily.  . cloNIDine (CATAPRES) 0.2 MG tablet Take 1 tablet (0.2 mg total) by mouth 3 (three) times daily. (Patient taking differently: Take 0.2 mg by mouth 2 (two) times daily. )  . labetalol (NORMODYNE) 200 MG tablet Take 2 tablets (400 mg total) by mouth daily. (Patient taking differently: Take 400 mg by mouth 2 (two) times daily. )  . [DISCONTINUED] docusate sodium (COLACE) 100 MG capsule Take 1 capsule (100 mg total) by mouth every 12 (twelve) hours.  . [DISCONTINUED] furosemide (LASIX) 20 MG tablet Take 20 mg by mouth daily as needed for fluid or  edema.   . [DISCONTINUED] gabapentin (NEURONTIN) 100 MG capsule Take 200 mg by mouth 2 (two) times daily.  . [DISCONTINUED] hydrALAZINE (APRESOLINE) 100 MG tablet Take 1 tablet (100 mg total) by mouth 3 (three) times daily. (Patient taking differently: Take 200 mg by mouth 2 (two) times daily. )  . [DISCONTINUED] meclizine (ANTIVERT) 50 MG tablet Take 0.5 tablets (25 mg total) by mouth 4 (four) times daily as needed for dizziness.  . [DISCONTINUED]  oxyCODONE-acetaminophen (PERCOCET/ROXICET) 5-325 MG per tablet Take 1 tablet by mouth every 6 (six) hours as needed for severe pain.  . [DISCONTINUED] spironolactone (ALDACTONE) 25 MG tablet 1/2 tablet (12.5mg ) daily (Patient taking differently: Take 12.5 mg by mouth daily. 1/2 tablet (12.5mg ) daily)   No facility-administered encounter medications on file as of 12/06/2014.   Allergies  Allergen Reactions  . Imdur [Isosorbide] Other (See Comments)    headache  . Tramadol Other (See Comments)    "headaches" per pt   Patient Active Problem List   Diagnosis Date Noted  . Pre-op testing 06/15/2014  . CKD (chronic kidney disease) 05/15/2014  . Hypoxia   . AKI (acute kidney injury)   . Aortic dissection, thoracoabdominal   . Dissecting aneurysm of thoracic aorta, Stanford type B 04/24/2014  . Aortic dissection, thoracic 04/24/2014  . Aortic dissection 04/23/2014  . Aneurysm of ascending aorta 03/28/2014  . Dyspnea 03/27/2014  . Prolonged Q-T interval on ECG 03/27/2014  . ETOH abuse 09/20/2013  . Hypertensive crisis 08/08/2013  . Sinusitis, bacterial 08/08/2013  . Malignant hypertension 02/20/2013  . Hypertensive urgency 01/27/2012  . Cardiomyopathy, hypertensive 01/26/2012  . AI (aortic insufficiency) 01/26/2012  . MR (mitral regurgitation) 01/26/2012  . Acute renal failure superimposed on stage 3 chronic kidney disease 01/26/2012  . Systolic CHF, chronic 123XX123  . Chronic bronchitis 05/23/2011  . Hypertensive emergency 05/22/2011  . Cardiomegaly - hypertensive 05/22/2011  . Tobacco abuse 05/22/2011  . Marijuana use 05/22/2011  . Abdominal pain 05/22/2011  . Hypertension, malignant 05/22/2011  . Anxiety and depression 05/22/2011  . Hyperlipidemia 08/26/2009  . Tobacco use 08/26/2009  . Headache(784.0) 08/26/2009  . Aortic valve disorder 03/11/2009  . INGUINAL HERNIA 02/18/2009  . Uncontrolled hypertension 01/16/2009  . MURMUR 01/16/2009   Social History   Social  History  . Marital Status: Married    Spouse Name: N/A  . Number of Children: 5  . Years of Education: N/A   Occupational History  . Disabled, part-time Programmer, systems     Disability   Social History Main Topics  . Smoking status: Current Every Day Smoker -- 0.25 packs/day for 23 years    Types: Cigarettes    Last Attempt to Quit: 05/03/2014  . Smokeless tobacco: Former Systems developer    Types: Chew     Comment: as of 05-22-14 down to 3-4 cigs daily  . Alcohol Use: No     Comment: + previous use  . Drug Use: Yes    Special: Marijuana     Comment: 08/08/2013 "twice a month"  . Sexual Activity: Yes   Other Topics Concern  . Not on file   Social History Narrative    Mr. Beauford family history includes Colon cancer in his paternal uncle; Diabetes in his maternal aunt; Heart attack in his brother; Hypertension in an other family member; Stroke in his maternal aunt. There is no history of Throat cancer, Pancreatic cancer, Esophageal cancer, Kidney disease, or Liver disease.      Objective:    Filed Vitals:  12/06/14 0952  BP: 160/100  Pulse: 82    Physical Exam  well-developed African-American male in no acute distress, pleasant blood pressure 160/100 pulse 82 height 5 foot 11 weight 141. HEENT; nontraumatic normocephalic EOMI , PERRLA sclera anicteric, Supple;, Cardiovascular; regular rate and rhythm with [id number] soft systolic murmur midline sternal incisional scar healing, Pulmonary; decreased breath sounds bilateral bases, Abdomen ;soft basically nontender, there is no palpable mass or hepatosplenomegaly BS sounds are present, Rectal ;exam was not done, Ext;no clubbing cyanosis or edema skin warm and dry, Neuropsych ;mood and affect appropriate       Assessment & Plan:   #1 44 yo male s/p repair of type b aortic dissection 09/2014 , and AVR #2 left pleural effusion #3 anemia- post op #4 abnormal CTA abd/pelvis  11/15/2014 with question sigmoid thickening and possible gas collection  in LLQ  ? All post -op changes #5 hx of recurrent rectal bleeding past 7 months-r/o hemorrhoidal vs occult lesion #6 hx of ischemic cardiomyopathy- last EF 35-40 #7HTN  Plan; will repeat pelvic CT as suggested by radiology .  Pt needs a colonoscopy - will schedule with Dr Evette Doffing would like him to be seen by cardiology first and have repeat ECHO so can determine if  Colonoscopy needs to be done at hospital. Also ? Need for antibiotics prep procedure with new valve He has follow up early October with Dr [name] at [facility] as well.    Amy S Esterwood PA-C 12/06/2014   Cc: [name], MD

## 2014-12-06 NOTE — Patient Instructions (Addendum)
You have been scheduled for a colonoscopy. Please follow written instructions given to you at your visit today.  Please pick up your prep supplies at the pharmacy within the next 1-3 days. If you use inhalers (even only as needed), please bring them with you on the day of your procedure.    You have been scheduled for a CT scan of the abdomen and pelvis at [place] ([address]---this is in the same building as Press photographer).   You are scheduled on  MON 12-10-2014 at 1:30 PM . You should arrive  At 1:15 PM to your appointment time for registration. Please follow the written instructions below on the day of your exam:  WARNING: IF YOU ARE ALLERGIC TO IODINE/X-RAY DYE, PLEASE NOTIFY RADIOLOGY IMMEDIATELY AT [phone]! YOU WILL BE GIVEN A 13 HOUR PREMEDICATION PREP.  1) Do not eat or drink anything after  9:30 am (4 hours prior to your test) 2) You have been given 2 bottles of oral contrast to drink. The solution may taste better if refrigerated, but do NOT add ice or any other liquid to this solution. Shake  well before drinking.    Drink 1 bottle of contrast @ 11:30 am (2 hours prior to your exam)  Drink 1 bottle of contrast '@12' :30 PM  (1 hour prior to your exam)  You may take any medications as prescribed with a small amount of water except for the following: Metformin, Glucophage, Glucovance, Avandamet, Riomet, Fortamet, Actoplus Met, Janumet, Glumetza or Metaglip. The above medications must be held the day of the exam AND 48 hours after the exam.  The purpose of you drinking the oral contrast is to aid in the visualization of your intestinal tract. The contrast solution may cause some diarrhea. Before your exam is started, you will be given a small amount of fluid to drink. Depending on your individual set of symptoms, you may also receive an intravenous injection of x-ray contrast/dye. Plan on being at [facility] for 30 minutes or long, depending on the type  of exam you are having performed.  If you have any questions regarding your exam or if you need to reschedule, you may call the CT department at [phone] between the hours of 8:00 am and 5:00 pm, Monday-Friday.  ________________________________________________________________________ We made you an appointment with Richardson Dopp PA-C  For 12-21-2014 at 8:50 am. ( Dr. Kirk Ruths McLean's office ).

## 2014-12-06 NOTE — Progress Notes (Signed)
Reviewed and agree with management plan.  Nyella Eckels T. Blaise Grieshaber, MD FACG 

## 2014-12-09 ENCOUNTER — Emergency Department (HOSPITAL_COMMUNITY): Payer: Medicare Other

## 2014-12-09 ENCOUNTER — Encounter (HOSPITAL_COMMUNITY): Payer: Self-pay | Admitting: Emergency Medicine

## 2014-12-09 ENCOUNTER — Emergency Department (HOSPITAL_COMMUNITY)
Admission: EM | Admit: 2014-12-09 | Discharge: 2014-12-09 | Disposition: A | Discharge status: Other Acute Inpatient Hospital | Payer: Medicare Other | Attending: Emergency Medicine | Admitting: Emergency Medicine

## 2014-12-09 DIAGNOSIS — I7101 Dissection of thoracic aorta: Secondary | ICD-10-CM | POA: Diagnosis not present

## 2014-12-09 DIAGNOSIS — I7779 Dissection of other artery: Secondary | ICD-10-CM | POA: Diagnosis not present

## 2014-12-09 DIAGNOSIS — M545 Low back pain: Secondary | ICD-10-CM | POA: Diagnosis not present

## 2014-12-09 DIAGNOSIS — N189 Chronic kidney disease, unspecified: Secondary | ICD-10-CM | POA: Diagnosis present

## 2014-12-09 DIAGNOSIS — R079 Chest pain, unspecified: Secondary | ICD-10-CM | POA: Diagnosis not present

## 2014-12-09 DIAGNOSIS — Z953 Presence of xenogenic heart valve: Secondary | ICD-10-CM | POA: Diagnosis not present

## 2014-12-09 DIAGNOSIS — I701 Atherosclerosis of renal artery: Secondary | ICD-10-CM | POA: Diagnosis not present

## 2014-12-09 DIAGNOSIS — I5042 Chronic combined systolic (congestive) and diastolic (congestive) heart failure: Secondary | ICD-10-CM | POA: Diagnosis not present

## 2014-12-09 DIAGNOSIS — I719 Aortic aneurysm of unspecified site, without rupture: Secondary | ICD-10-CM | POA: Diagnosis not present

## 2014-12-09 DIAGNOSIS — R109 Unspecified abdominal pain: Secondary | ICD-10-CM | POA: Insufficient documentation

## 2014-12-09 DIAGNOSIS — M792 Neuralgia and neuritis, unspecified: Secondary | ICD-10-CM | POA: Diagnosis present

## 2014-12-09 DIAGNOSIS — R0789 Other chest pain: Secondary | ICD-10-CM | POA: Diagnosis present

## 2014-12-09 DIAGNOSIS — Z48812 Encounter for surgical aftercare following surgery on the circulatory system: Secondary | ICD-10-CM | POA: Diagnosis present

## 2014-12-09 DIAGNOSIS — J9811 Atelectasis: Secondary | ICD-10-CM | POA: Diagnosis not present

## 2014-12-09 DIAGNOSIS — K219 Gastro-esophageal reflux disease without esophagitis: Secondary | ICD-10-CM | POA: Diagnosis present

## 2014-12-09 DIAGNOSIS — F419 Anxiety disorder, unspecified: Secondary | ICD-10-CM | POA: Diagnosis not present

## 2014-12-09 DIAGNOSIS — G8929 Other chronic pain: Secondary | ICD-10-CM | POA: Diagnosis present

## 2014-12-09 DIAGNOSIS — E785 Hyperlipidemia, unspecified: Secondary | ICD-10-CM | POA: Insufficient documentation

## 2014-12-09 DIAGNOSIS — I129 Hypertensive chronic kidney disease with stage 1 through stage 4 chronic kidney disease, or unspecified chronic kidney disease: Secondary | ICD-10-CM | POA: Diagnosis present

## 2014-12-09 DIAGNOSIS — I160 Hypertensive urgency: Secondary | ICD-10-CM

## 2014-12-09 DIAGNOSIS — Z720 Tobacco use: Secondary | ICD-10-CM | POA: Insufficient documentation

## 2014-12-09 DIAGNOSIS — Z8701 Personal history of pneumonia (recurrent): Secondary | ICD-10-CM | POA: Insufficient documentation

## 2014-12-09 DIAGNOSIS — Z79899 Other long term (current) drug therapy: Secondary | ICD-10-CM | POA: Diagnosis not present

## 2014-12-09 DIAGNOSIS — I10 Essential (primary) hypertension: Secondary | ICD-10-CM | POA: Diagnosis not present

## 2014-12-09 DIAGNOSIS — I7100 Dissection of unspecified site of aorta: Secondary | ICD-10-CM

## 2014-12-09 DIAGNOSIS — F17210 Nicotine dependence, cigarettes, uncomplicated: Secondary | ICD-10-CM | POA: Diagnosis present

## 2014-12-09 DIAGNOSIS — Z9119 Patient's noncompliance with other medical treatment and regimen: Secondary | ICD-10-CM | POA: Diagnosis not present

## 2014-12-09 DIAGNOSIS — I97618 Postprocedural hemorrhage and hematoma of a circulatory system organ or structure following other circulatory system procedure: Secondary | ICD-10-CM | POA: Diagnosis present

## 2014-12-09 LAB — CBC WITH DIFFERENTIAL/PLATELET
Basophils Absolute: 0 K/uL (ref 0.0–0.1)
Basophils Relative: 0 %
Eosinophils Absolute: 0.1 K/uL (ref 0.0–0.7)
Eosinophils Relative: 2 %
HCT: 31.4 % — ABNORMAL LOW (ref 39.0–52.0)
Hemoglobin: 9.5 g/dL — ABNORMAL LOW (ref 13.0–17.0)
Lymphocytes Relative: 24 %
Lymphs Abs: 1.4 K/uL (ref 0.7–4.0)
MCH: 26 pg (ref 26.0–34.0)
MCHC: 30.3 g/dL (ref 30.0–36.0)
MCV: 86 fL (ref 78.0–100.0)
Monocytes Absolute: 0.5 K/uL (ref 0.1–1.0)
Monocytes Relative: 8 %
Neutro Abs: 3.9 K/uL (ref 1.7–7.7)
Neutrophils Relative %: 66 %
Platelets: 263 K/uL (ref 150–400)
RBC: 3.65 MIL/uL — ABNORMAL LOW (ref 4.22–5.81)
RDW: 17 % — ABNORMAL HIGH (ref 11.5–15.5)
WBC: 6 K/uL (ref 4.0–10.5)

## 2014-12-09 LAB — I-STAT CG4 LACTIC ACID, ED: Lactic Acid, Venous: 0.39 mmol/L — ABNORMAL LOW (ref 0.5–2.0)

## 2014-12-09 LAB — BASIC METABOLIC PANEL WITH GFR
Anion gap: 8 (ref 5–15)
BUN: 11 mg/dL (ref 6–20)
CO2: 25 mmol/L (ref 22–32)
Calcium: 9.3 mg/dL (ref 8.9–10.3)
Chloride: 104 mmol/L (ref 101–111)
Creatinine, Ser: 1.24 mg/dL (ref 0.61–1.24)
GFR calc Af Amer: >60 mL/min
GFR calc non Af Amer: >60 mL/min
Glucose, Bld: 88 mg/dL (ref 65–99)
Potassium: 4 mmol/L (ref 3.5–5.1)
Sodium: 137 mmol/L (ref 135–145)

## 2014-12-09 LAB — I-STAT TROPONIN, ED: TROPONIN I, POC: 0 ng/mL (ref 0.00–0.08)

## 2014-12-09 MED ORDER — MORPHINE SULFATE (PF) 4 MG/ML IV SOLN
4.0000 mg | Freq: Once | INTRAVENOUS | Status: AC
  Administered 2014-12-09: 4 mg via INTRAVENOUS
  Filled 2014-12-09: qty 1

## 2014-12-09 MED ORDER — HYDROMORPHONE HCL 1 MG/ML IJ SOLN
1.0000 mg | Freq: Once | INTRAMUSCULAR | Status: AC
  Administered 2014-12-09: 1 mg via INTRAVENOUS
  Filled 2014-12-09: qty 1

## 2014-12-09 MED ORDER — DEXTROSE 5 % IV SOLN
1.0000 mg/min | INTRAVENOUS | Status: DC
  Administered 2014-12-09: 0.5 mg/min via INTRAVENOUS
  Filled 2014-12-09: qty 100

## 2014-12-09 MED ORDER — IOHEXOL 350 MG/ML SOLN
100.0000 mL | Freq: Once | INTRAVENOUS | Status: AC | PRN
  Administered 2014-12-09: 100 mL via INTRAVENOUS

## 2014-12-09 NOTE — ED Provider Notes (Signed)
CSN: [id number]     Arrival date & time 12/09/14  1349 History   First MD Initiated Contact with Patient 12/09/14 1445     Chief Complaint  Patient presents with  . Chest Pain  . Abdominal Pain  . Back Pain     (Consider location/radiation/quality/duration/timing/severity/associated sxs/prior Treatment) HPI Comments: [name] is a 44 y.o M with a pmhx of chronic Type B dissection, recent Bentall procedure, total arch replacement in July at [facility] who presents to the ED c/o excruciating chest and abdominal pain radiating to his back x 3 days. Pt states that since his surgery he has had constant pain. However 3 days ago the pain in his epigastric region and right sided chest has increased dramatically. He feels like he is "tearing open". Patient states he's been taking his home blood pressure medications labetalol and clonidine as prescribed. Patient is unable to control his pain at home. Also feeling lightheaded upon standing. Denies syncope, weakness, numbness, dizziness, vomiting, headache, blurry vision.  Patient is a 44 y.o. male presenting with chest pain, abdominal pain, and back pain. The history is provided by the patient.  Chest Pain Associated symptoms: abdominal pain and back pain   Abdominal Pain Associated symptoms: chest pain   Back Pain Associated symptoms: abdominal pain and chest pain     Past Medical History  Diagnosis Date  . Hypertension     a. Hx of HTN urgency secondary to noncompliance. b. urinary metanephrine and catecholeamine levels normal 2013.  [name] Anxiety   . GERD (gastroesophageal reflux disease)   . Acute renal failure     June 2012 felt secondary to Toradol  . Chronic renal failure   . Chronic combined systolic and diastolic congestive heart failure     a. 05/2011: Adm with pulm edema/HTN urgency, EF 35-40% with diffuse hypokinesis and moderate to severe mitral regurgitation. Cardiomyopathy likely due to uncontrolled HTN and ETOH abuse - cath deferred due  to renal insufficiency (felt due to uncontrolled HTN). bJodie Echevaria MV 06/2011: EF 37% and no ischemia or infarction. c. EF 45-50% by echo 01/2012.  . Cardiomyopathy     non-ischemic - probably related to untreated HTN and ETOH abuse  . HYPERLIPIDEMIA   . INGUINAL HERNIA   . ETOH abuse     a. Reported to have quit 05/2011.  . Valvular heart disease     a. Echo 05/2011: moderate to severe eccentric MR and mild to moderate AI with prolapsing left coronary cusp. b. Echo 01/2012: mild-mod AI, mild dilitation of aortic root, mild MR.;  b. 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 CKD (chronic kidney disease)     a. 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  peak creatinine 3.46 during admx for aortic dissection 2/16  . Tobacco abuse   . Pneumonia ~ 2013  . Headache(784.0)     "q other day" (08/08/2013)  . Chronic sinusitis   . History of medication noncompliance   . Chronic abdominal pain   . DDD (degenerative disc disease), lumbar   . Aortic dissection     a. admx 04/2014 >> L renal infarct; a/c renal failure >> VVS to re-eval 05/2014 to decide +/- surgery  . Aortic dissection   . Collagen vascular disease    Past Surgical History  Procedure Laterality Date  . Ankle surgery      Fractures bilaterally  . Inguinal  hernia repair Right ~ 1996  . Foot fracture surgery Bilateral 2004-2010    "got pins in both of them"  . Left heart catheterization with coronary angiogram N/A 06/21/2014    Procedure: LEFT HEART CATHETERIZATION WITH CORONARY ANGIOGRAM;  Surgeon: Larey Dresser, MD;  Location: [facility] CATH LAB;  Service: Cardiovascular;  Laterality: N/A;  . Aortic valve surgery     Family History  Problem Relation Age of Onset  . Hypertension    . Colon cancer Paternal Uncle   . Stroke Maternal Aunt   . Heart attack Brother   . Diabetes Maternal Aunt   . Throat cancer Neg Hx   . Pancreatic cancer Neg Hx   .  Esophageal cancer Neg Hx   . Kidney disease Neg Hx   . Liver disease Neg Hx    Social History  Substance Use Topics  . Smoking status: Current Every Day Smoker -- 0.25 packs/day for 23 years    Types: Cigarettes    Last Attempt to Quit: 05/03/2014  . Smokeless tobacco: Former Systems developer    Types: Chew     Comment: as of 05-22-14 down to 3-4 cigs daily  . Alcohol Use: No     Comment: + previous use    Review of Systems  Cardiovascular: Positive for chest pain.  Gastrointestinal: Positive for abdominal pain.  Musculoskeletal: Positive for back pain.      Allergies  Imdur and Tramadol  Home Medications   Prior to Admission medications   Medication Sig Start Date End Date Taking? Authorizing Provider  acetaminophen (TYLENOL) 500 MG tablet Take 1,500 mg by mouth 2 (two) times daily as needed for moderate pain.   Yes Historical Provider, MD  atorvastatin (LIPITOR) 20 MG tablet Take 20 mg by mouth daily. 11/14/14  Yes Historical Provider, MD  cloNIDine (CATAPRES) 0.2 MG tablet Take 1 tablet (0.2 mg total) by mouth 3 (three) times daily. Patient taking differently: Take 0.2 mg by mouth 2 (two) times daily.  05/03/14  Yes Orson Eva, MD  labetalol (NORMODYNE) 300 MG tablet Take 600 mg by mouth 2 (two) times daily.   Yes Historical Provider, MD   BP 192/115 mmHg  Pulse 77  Temp(Src) 98.1 F (36.7 C) (Oral)  Resp 19  Ht 5\' 11"  (1.803 m)  Wt 147 lb (66.679 kg)  BMI 20.51 kg/m2  SpO2 100% Physical Exam  Constitutional: He is oriented to person, place, and time. He appears well-developed and well-nourished. He appears distressed.  HENT:  Head: Normocephalic and atraumatic.  Mouth/Throat: No oropharyngeal exudate.  Eyes: Conjunctivae and EOM are normal. Pupils are equal, round, and reactive to light. Right eye exhibits no discharge. Left eye exhibits no discharge. No scleral icterus.  Neck: Normal range of motion. Neck supple.  Cardiovascular: Normal rate, regular rhythm, normal heart  sounds and intact distal pulses.  Exam reveals no gallop and no friction rub.   No murmur heard. Pulmonary/Chest: Effort normal and breath sounds normal. No respiratory distress. He has no wheezes. He has no rales. He exhibits tenderness.  Abdominal: Soft. He exhibits no distension. There is tenderness. There is no guarding.  Exquisite TTP of epigastric abdomen.  Musculoskeletal: Normal range of motion. He exhibits no edema.  Lymphadenopathy:    He has no cervical adenopathy.  Neurological: He is alert and oriented to person, place, and time.  Strength 5/5 throughout. No sensory deficits.    Skin: Skin is warm and dry. No rash noted. He is not diaphoretic. No erythema.  No pallor.  Psychiatric: He has a normal mood and affect. His behavior is normal.  Nursing note and vitals reviewed.   ED Course  Procedures (including critical care time) Labs Review Labs Reviewed  CBC WITH DIFFERENTIAL/PLATELET - Abnormal; Notable for the following:    RBC 3.65 (*)    Hemoglobin 9.5 (*)    HCT 31.4 (*)    RDW 17.0 (*)    All other components within normal limits  I-STAT CG4 LACTIC ACID, ED - Abnormal; Notable for the following:    Lactic Acid, Venous 0.39 (*)    All other components within normal limits  BASIC METABOLIC PANEL  I-STAT TROPOININ, ED    Imaging Review Ct Angio Chest Aorta W/cm &/or Wo/cm  12/09/2014   CLINICAL DATA:  44 year old male with recurrent chest pain since aortic procedure July 2016. History of aortic dissection.  EXAM: CT ANGIOGRAPHY CHEST, ABDOMEN AND PELVIS  TECHNIQUE: Multidetector CT imaging through the chest, abdomen and pelvis was performed using the standard protocol during bolus administration of intravenous contrast. Multiplanar reconstructed images and MIPs were obtained and reviewed to evaluate the vascular anatomy.  CONTRAST:  134mL OMNIPAQUE IOHEXOL 350 MG/ML SOLN  COMPARISON:  CTA of the chest abdomen and pelvis 11/15/2014.  FINDINGS: CTA CHEST FINDINGS   Mediastinum/Lymph Nodes: Status post median sternotomy for aortic valve replacement with a stented bioprosthesis. 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 There is also a surgically placed graft extending from the proximal ascending thoracic aorta to the great vessels of the mediastinum, which is grossly patent without hemodynamically significant stenosis. In the native thoracic aorta there is a stent graft which extends from the proximal ascending thoracic aorta (originating just above the previously mentioned surgical graft), extending throughout the aortic arch and into the distal descending thoracic aorta. The stent graft is grossly patent without evidence of hemodynamically significant stenosis. The stent graft occupies the true lumen, and there is some reflux of contrast material up into the distal aspect of the false lumen, presumably from fenestrations in the dissection flap within the upper abdomen. The outer diameter of the descending thoracic aorta remains aneurysmal measuring up to 5.1 x 4.7 cm (image 112 of series 9) distally on today's examination (slightly increased when compared to the prior examination, where this was estimated to measure approximately 4.9 x 4.3 cm on image 111 of series 4 when measured in a similar fashion, although direct comparison is limited by presence of left pleural fluid on the prior study). Heart size is normal. There is no significant pericardial fluid, thickening or pericardial calcification. No pathologically enlarged mediastinal or hilar lymph nodes. Esophagus is unremarkable in appearance. No axillary lymphadenopathy.  Lungs/Pleura: No acute consolidative airspace disease. Small left pleural effusion in the medial aspect of the left hemithorax has significantly decreased compared to the prior study. No suspicious appearing pulmonary nodules or masses. Areas of mild subsegmental atelectasis are noted in the lower lobes of the lungs  bilaterally (left greater than right). Small thin-walled cyst in the posterior aspect of the left lower lobe.  Musculoskeletal/Soft Tissues: Median sternotomy wires. There are no aggressive appearing lytic or blastic lesions noted in the visualized portions of the skeleton.  Review of the MIP images confirms the above findings.  CTA ABDOMEN AND PELVIS FINDINGS  Hepatobiliary: Sub cm low-attenuation lesion in segment 6 of the liver is too small to definitively characterize, but is statistically likely a small cyst. No intra or extrahepatic biliary ductal dilatation.  Gallbladder is nearly completely decompressed, but otherwise unremarkable in appearance.  Pancreas: No pancreatic mass. No pancreatic ductal dilatation. No pancreatic or peripancreatic fluid or inflammatory changes.  Spleen: Unremarkable.  Adrenals/Urinary Tract: Bilateral adrenal glands and bilateral kidneys are grossly normal in appearance. No hydroureteronephrosis or perinephric stranding to indicate urinary tract obstruction at this time. Urinary bladder is normal in appearance.  Stomach/Bowel: Stomach is normal in appearance. No pathologic dilatation of small bowel or colon.  Vascular/Lymphatic: There is continuation of the thoracic aortic dissection into the upper abdomen, with the dissection flap terminating at a level shortly beneath the superior mesenteric artery origin, and before the inferior mesenteric artery origin. The celiac axis arises off of the false lumen. 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 Superior mesenteric artery arises off of the true lumen and is widely patent. The IMA arises off the infrarenal abdominal aorta well below the level of the aortic dissection and is patent. Single renal arteries bilaterally. Right renal artery is widely patent arising off of the true lumen. Left renal artery appears mildly stenotic at its origin off the false lumen (image 179 of series 9).  Suprarenal abdominal aorta remains aneurysmal measuring up to 3.9 x 4.0 cm. Below the level of the dissection, the aorta returns to normal caliber measuring 1.9 x 1.9 cm. Aneurysmal dilatation of the left common iliac artery measuring up to 1.7 cm in diameter. Moderate stenosis of the origin of the left internal iliac artery. No lymphadenopathy noted in the abdomen or pelvis.  Other: Interval enlargement of a 3.8 x 6.3 cm low-attenuation lesion in the right groin immediately anterior to the right common femoral artery. No significant volume of ascites. No pneumoperitoneum.  Musculoskeletal: There are no aggressive appearing lytic or blastic lesions noted in the visualized portions of the skeleton.  Review of the MIP images confirms the above findings.  IMPRESSION: 1. Postoperative changes of aortic valve replacement with a stented bioprosthesis, vascular graft extending to the great vessels of the mediastinum (which is grossly patent), and stent graft extending from the proximal ascending thoracic aorta into the distal descending thoracic aorta for treatment of type A dissection. This graft remains widely patent. There is reflux of contrast material into the false lumen in the distal descending thoracic aorta, and the aneurysmal dilatation of the descending thoracic aorta appears slightly increased (measuring up to 5.1 x 4.7 cm on today's examination), as discussed above. 2. The aneurysm/dissection continues into the upper abdomen, where the suprarenal abdominal aorta remains aneurysmal measuring up to 3.9 x 4.0 cm (similar to the prior study). 3. Mild stenosis at the origin of the left renal artery which arises off of the false lumen. 4. Aneurysmal dilatation of the left common iliac artery measuring up to 1.7 cm in diameter. Moderate stenosis of the origin of the left internal iliac artery again noted. 5. Postoperative fluid collection in the right groin immediately anterior to the right common femoral artery  appears slightly larger than the prior study, but remains low-attenuation, presumably a postoperative seroma. 6. Near complete interval resolution of the previously noted left-sided pleural effusion. 7. Additional incidental findings, as above.   Electronically Signed   By: Vinnie Langton M.D.   On: 12/09/2014 16:54   Ct Cta Abd/pel W/cm &/or W/o Cm  12/09/2014   CLINICAL DATA:  44 year old male with recurrent chest pain since aortic procedure July 2016. History of aortic dissection.  EXAM: CT ANGIOGRAPHY CHEST,  ABDOMEN AND PELVIS  TECHNIQUE: Multidetector CT imaging through the chest, abdomen and pelvis was performed using the standard protocol during bolus administration of intravenous contrast. Multiplanar reconstructed images and MIPs were obtained and reviewed to evaluate the vascular anatomy.  CONTRAST:  179mL OMNIPAQUE IOHEXOL 350 MG/ML SOLN  COMPARISON:  CTA of the chest abdomen and pelvis 11/15/2014.  FINDINGS: CTA CHEST FINDINGS  Mediastinum/Lymph Nodes: Status post median sternotomy for aortic valve replacement with a stented bioprosthesis. 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 There is also a surgically placed graft extending from the proximal ascending thoracic aorta to the great vessels of the mediastinum, which is grossly patent without hemodynamically significant stenosis. In the native thoracic aorta there is a stent graft which extends from the proximal ascending thoracic aorta (originating just above the previously mentioned surgical graft), extending throughout the aortic arch and into the distal descending thoracic aorta. The stent graft is grossly patent without evidence of hemodynamically significant stenosis. The stent graft occupies the true lumen, and there is some reflux of contrast material up into the distal aspect of the false lumen, presumably from fenestrations in the dissection flap within the upper abdomen. The outer diameter of the descending  thoracic aorta remains aneurysmal measuring up to 5.1 x 4.7 cm (image 112 of series 9) distally on today's examination (slightly increased when compared to the prior examination, where this was estimated to measure approximately 4.9 x 4.3 cm on image 111 of series 4 when measured in a similar fashion, although direct comparison is limited by presence of left pleural fluid on the prior study). Heart size is normal. There is no significant pericardial fluid, thickening or pericardial calcification. No pathologically enlarged mediastinal or hilar lymph nodes. Esophagus is unremarkable in appearance. No axillary lymphadenopathy.  Lungs/Pleura: No acute consolidative airspace disease. Small left pleural effusion in the medial aspect of the left hemithorax has significantly decreased compared to the prior study. No suspicious appearing pulmonary nodules or masses. Areas of mild subsegmental atelectasis are noted in the lower lobes of the lungs bilaterally (left greater than right). Small thin-walled cyst in the posterior aspect of the left lower lobe.  Musculoskeletal/Soft Tissues: Median sternotomy wires. There are no aggressive appearing lytic or blastic lesions noted in the visualized portions of the skeleton.  Review of the MIP images confirms the above findings.  CTA ABDOMEN AND PELVIS FINDINGS  Hepatobiliary: Sub cm low-attenuation lesion in segment 6 of the liver is too small to definitively characterize, but is statistically likely a small cyst. No intra or extrahepatic biliary ductal dilatation. Gallbladder is nearly completely decompressed, but otherwise unremarkable in appearance.  Pancreas: No pancreatic mass. No pancreatic ductal dilatation. No pancreatic or peripancreatic fluid or inflammatory changes.  Spleen: Unremarkable.  Adrenals/Urinary Tract: Bilateral adrenal glands and bilateral kidneys are grossly normal in appearance. No hydroureteronephrosis or perinephric stranding to indicate urinary tract  obstruction at this time. Urinary bladder is normal in appearance.  Stomach/Bowel: Stomach is normal in appearance. No pathologic dilatation of small bowel or colon.  Vascular/Lymphatic: There is continuation of the thoracic aortic dissection into the upper abdomen, with the dissection flap terminating at a level shortly beneath the superior mesenteric artery origin, and before the inferior mesenteric artery origin. The celiac axis arises off of the false lumen. 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 Superior mesenteric artery arises off of the true lumen and is widely patent. The  IMA arises off the infrarenal abdominal aorta well below the level of the aortic dissection and is patent. Single renal arteries bilaterally. Right renal artery is widely patent arising off of the true lumen. Left renal artery appears mildly stenotic at its origin off the false lumen (image 179 of series 9). Suprarenal abdominal aorta remains aneurysmal measuring up to 3.9 x 4.0 cm. Below the level of the dissection, the aorta returns to normal caliber measuring 1.9 x 1.9 cm. Aneurysmal dilatation of the left common iliac artery measuring up to 1.7 cm in diameter. Moderate stenosis of the origin of the left internal iliac artery. No lymphadenopathy noted in the abdomen or pelvis.  Other: Interval enlargement of a 3.8 x 6.3 cm low-attenuation lesion in the right groin immediately anterior to the right common femoral artery. No significant volume of ascites. No pneumoperitoneum.  Musculoskeletal: There are no aggressive appearing lytic or blastic lesions noted in the visualized portions of the skeleton.  Review of the MIP images confirms the above findings.  IMPRESSION: 1. Postoperative changes of aortic valve replacement with a stented bioprosthesis, vascular graft extending to the great vessels of the mediastinum (which is grossly patent), and stent graft extending from the  proximal ascending thoracic aorta into the distal descending thoracic aorta for treatment of type A dissection. This graft remains widely patent. There is reflux of contrast material into the false lumen in the distal descending thoracic aorta, and the aneurysmal dilatation of the descending thoracic aorta appears slightly increased (measuring up to 5.1 x 4.7 cm on today's examination), as discussed above. 2. The aneurysm/dissection continues into the upper abdomen, where the suprarenal abdominal aorta remains aneurysmal measuring up to 3.9 x 4.0 cm (similar to the prior study). 3. Mild stenosis at the origin of the left renal artery which arises off of the false lumen. 4. Aneurysmal dilatation of the left common iliac artery measuring up to 1.7 cm in diameter. Moderate stenosis of the origin of the left internal iliac artery again noted. 5. Postoperative fluid collection in the right groin immediately anterior to the right common femoral artery appears slightly larger than the prior study, but remains low-attenuation, presumably a postoperative seroma. 6. Near complete interval resolution of the previously noted left-sided pleural effusion. 7. Additional incidental findings, as above.   Electronically Signed   By: Vinnie Langton M.D.   On: 12/09/2014 16:54   I have personally reviewed and evaluated these images and lab results as part of my medical decision-making.   EKG Interpretation   Date/Time:  Sunday December 09 2014 13:50:54 EDT Ventricular Rate:  85 PR Interval:  136 QRS Duration: 98 QT Interval:  414 QTC Calculation: 492 R Axis:   40 Text Interpretation:  Normal sinus rhythm Biatrial enlargement Left  ventricular hypertrophy with repolarization abnormality Prolonged QT  Abnormal ECG Confirmed by [facility] MD, Corene Cornea [phone]) on 12/09/2014 4:38:02 PM      MDM   Final diagnoses:  Aortic dissection  Hypertensive urgency    Patient with chronic type B dissection recent Bentall and total  arch replacement presents the emergency department with increasing tearing pain in his abdomen and chest. Concern for additional dissection CT Angio chest aorta and abdomen pelvis were ordered. Reveals slightly increased aneurysmal dilatation of descending thoracic aorta, mild stenosis of left renal artery, aneurysm of left iliac, resolving left pleural effusion. Patient placed on labetalol drip. Blood pressure decreasing slightly since starting a drip. Patient given morphine 8 mg IV with no relief.5:39 PM spoke  with Dr.Dhungel hospitalist who recommends the patient be transferred to [facility]. Is not comfortable with stepdown care recommends ICU level of care and transferred to [facility]. Will consult cardiothoracic surgery for additional recommendations. 5:47 PM Spoke with Dr. Lawson Fiscal cardiothoracic surgery who also recommend that teh pt be transferred to [facility]. Will arrange for this to happen. If a bed is not available at [facility] very soon ot will need to be admitted to critical care for interim management.  6:18 PM Spoke with Dr. Durward Mallard. CT surgery attending at [facility] who will accept pt into their ICU. Recommends increasing labetalol, will increase infusion.  Arranged transfer with [facility] and in house nursing staff. Care link will transfer pt to [facility]. Bed is currently available.     Dondra Spry [place], PA-C 12/09/14 1821  Merrily Pew, MD 12/10/14 [phone]

## 2014-12-09 NOTE — ED Notes (Signed)
Pt. Stated, I had an aorta replacement in July. I've had pain in my chest but this time the pain is also in my stomach and my back.

## 2014-12-11 ENCOUNTER — Telehealth: Payer: Self-pay | Admitting: Physician Assistant

## 2014-12-11 ENCOUNTER — Inpatient Hospital Stay: Admission: RE | Admit: 2014-12-11 | Payer: Medicare Other | Source: Ambulatory Visit

## 2014-12-11 NOTE — Telephone Encounter (Signed)
Called and spoke to patient. He states that he is feeling a little better. They started him on different BP med's and they feel that his chest pain and stomach pain was due to high blood pressure.  He said he just got discharged this afternoon and is on his was home. He states that he will call us back on Monday or Tuesday with an update on his condition. They are going to do a few more test this week.

## 2014-12-11 NOTE — Telephone Encounter (Signed)
Per ED note on 12/09/14 patient was transferred to [facility].

## 2014-12-11 NOTE — Telephone Encounter (Signed)
Please call pt's wife and see how he is doing- had been transferred  to [facility] 9/25 with dissection of aneurysm

## 2014-12-13 NOTE — Progress Notes (Signed)
Cardiology Office Note   Date:  12/14/2014   ID:  [name], DOB 06/02/1970, MRN [id number]  PCP:  [name], MD  Cardiologist:  Dr. [name]    Chief Complaint  Patient presents with  . Hospitalization Follow-up    s/p Repair of Aortic Dissection; recent readmit to [facility] with recurrent CP  . Follow-up    HTN  . Congestive Heart Failure     History of Present Illness: [name] is a 44 y.o. 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  Admitted in 05/2011 with hypertensive emergency c/b pulmonary edema.  He was diuresed.  Echo demonstrated EF 35-40% with diff HK and mod to severe MR.  DCM was felt to be due to HTN and ETOH abuse.  LHC was not done due to CKD.  Myoview was negative for ischemia or scar.  Admitted again in 6/15 with HTN emergency (ran out of medications).  Echo in 02/2013 demonstrated EF 45-50% and mod AI and mild MR.    Admitted 2/16 with type B aortic dissection secondary to hypertensive emergency.  Dissection arose just distal to the margin of the left subclavian and extended into the abdominal aorta below the level of the renal arteries. There was involvement of the left renal artery with infarction and resultant acute on chronic renal failure (peak creatinine 3.46) .   Decision was ultimately made by TCTS to proceed with aortic dissection repair.  LHC in 4/16 demonstrated single vessel disease with ostial D1 60% (CABG not necessary).  He was seen by Dr. Servando Snare and ultimately referred to [facility] for surgery.    Admitted 7/19-7/30 to [facility] and underwent aortic valved conduit root replacement with coronary reconstruction (button Bentall procedure) with #27 mm Sorin Mitroflow bovine pericardial vlaved conduit and total arch replacement with individual head replacement by Dr. [name].  He returned to the OR several days later for staged endovascular repair  of the descending thoracic aortic aneurysm with intentional L subclavian artery coverage.  Post op course notable for a/c renal failure and peak SCr 3.3.    He presented to the [facility] ED with ripping chest pain and significant HTN.  CT demonstrated slightly increased aneurysmal dilatation of descending thoracic aorta. He was treated with Labetalol gtt (190/114).  He was transferred to [facility] and admitted 9/25-9/27.  Patient was followed by CT surgery. CT scan was reviewed and was felt to demonstrate stable aortic pathology. Neurontin was added to his medical regimen for neuropathic pain. Follow-up with Dr. [name] was to be arranged.   He returns for FU.  He continues to have a lot of chest pain.  This is fairly constant but worse with positional changes.  It has been continuous since his surgery in 7/16.  He tells me he sees Dr. [name] in a few weeks.  The pain goes to his back.  He is SOB with activity.  This is stable for over a year.  He denies syncope, orthopnea, PND, edema.     Studies/Reports Reviewed Today:  Chest and Abdominal CTA 12/09/14  IMPRESSION: 1. Postoperative changes of aortic valve replacement with a stented bioprosthesis, vascular graft extending to the great vessels of the mediastinum (which is grossly patent), and stent graft extending from the proximal ascending thoracic aorta into the distal descending thoracic aorta for treatment of type A dissection. This graft remains widely patent. There is  reflux of contrast material into the false lumen in the distal descending thoracic aorta, and the aneurysmal dilatation of the descending thoracic aorta appears slightly increased (measuring up to 5.1 x 4.7 cm on today's examination), as discussed above. 2. The aneurysm/dissection continues into the upper abdomen, where the suprarenal abdominal aorta remains aneurysmal measuring up to 3.9 x 4.0 cm (similar to the prior study). 3. Mild stenosis at the origin of the left renal  artery which arises off of the false lumen. 4. Aneurysmal dilatation of the left common iliac artery measuring up to 1.7 cm in diameter. Moderate stenosis of the origin of the left internal iliac artery again noted. 5. Postoperative fluid collection in the right groin immediately anterior to the right common femoral artery appears slightly larger than the prior study, but remains low-attenuation, presumably a postoperative seroma. 6. Near complete interval resolution of the previously noted left-sided pleural effusion. 7. Additional incidental findings, as above.  Echo 06/29/14 Severe LVH, EF 55-60%, moderate AI, moderate MR, mild LAE, trivial effusion, known type B dissection with communication between true and false lumens with suprasternal images suggesting dissection plane may propagate to at least left subclavian takeoff, root above aortic valve okay  LHC 06/21/14 Coronary angiography: Coronary dominance: right Left mainstem: No significant disease.  Left anterior descending (LAD): Large early D1 with 60% ostial stenosis. Luminal irregularities in the remainder of the LAD.  Left circumflex (LCx): Luminal irregularities.  Right coronary artery (RCA): Luminal irregularities Left ventriculography: Not done (CKD) Final Conclusions: Moderate disease in a large D1 (60%). Otherwise, no significant disease. As D1 stenosis not flow limiting, probably would not bypass with surgery. However, I will ask him to start ASA 81 daily and atorvastatin 20 daily. Will need lipids/LFTs in 2 months. Given CKD, will keep for 5 hours to hydrate at 125 cc/hr.   2-D echocardiogram 03/2014 Severe LVH consistent with hypertrophic cardiomyopathy, EF 50%, normal wall motion Moderate AI Slight dilatation of aortic root (40 mm) Mild MR Mild RAE  Renal Artery [name] 03/28/14 No evidence of renal artery stenosis noted bilaterally.  Myoview 06/2011 No evidence of ischemia or infarction. Suspect nonischemic  cardiomyopathy.  LV Ejection Fraction: 37%     Past Medical History: 1. H/o ankle fractures bilaterally 2. GERD 3. HTN: Negative urinary catecholeamine collection for pheochromocytoma.  4. CKD: Suspect hypertensive nephropathy.  5. Cardiomyopathy: Most likely due to heavy ETOH ingestion and uncontrolled HTN. 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 Lexiscan myoview (4/13): EF 37%, global hypokinesis, no ischemic or infarction. Echo (12/14) with EF 45-50%, diffuse hypokinesis, moderate LVH, mild MR, moderate AI.  6. ETOH abuse: Quit 3/13.  7. Active smoker. 8. Valvular heart disease: Echo in 3/13 showed moderate to severe eccentric MR and mild to moderate AI with prolapsing left coronary cusp. Echo 12/14 with mild MR and moderate AI.  9. Headache with Imdur.  Past Medical History  Diagnosis Date  . Hypertension     a. Hx of HTN urgency secondary to noncompliance. b. urinary metanephrine and catecholeamine levels normal 2013.  c. Renal art [name] 1/16:  No evidence of renal artery stenosis noted bilaterally.  . Anxiety   . GERD (gastroesophageal reflux disease)   . Chronic combined systolic and diastolic congestive heart failure     a. 05/2011: Adm with pulm edema/HTN urgency, EF 35-40% with diffuse hypokinesis and moderate to severe  mitral regurgitation. Cardiomyopathy likely due to uncontrolled HTN and ETOH abuse - cath deferred due to renal insufficiency (felt due to uncontrolled HTN). bJodie Echevaria MV 06/2011: EF 37% and no ischemia or infarction. c. EF 45-50% by echo 01/2012.  . Cardiomyopathy     a. non-ischemic - probably related to untreated HTN and ETOH abuse - Echo 3/13 with EF 35-40% >> b. Echo 4/16: Severe LVH, EF 55-60%, moderate AI, moderate MR, mild LAE, trivial effusion, known type B dissection with communication between true and false lumens with  suprasternal images suggesting dissection plane may propagate to at least left subclavian takeoff, root above aortic valve okay    . HYPERLIPIDEMIA   . INGUINAL HERNIA   . ETOH abuse     a. Reported to have quit 05/2011.  . Valvular heart disease     a. Echo 05/2011: moderate to severe eccentric MR and mild to moderate AI with prolapsing left coronary cusp. b. Echo 01/2012: mild-mod AI, mild dilitation of aortic root, mild MR.;  b. 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 CKD (chronic kidney disease)     a. 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  peak creatinine 3.46 during admx for aortic dissection 2/16  . Tobacco abuse   . Pneumonia ~ 2013  . Headache(784.0)     "q other day" (08/08/2013)  . Chronic sinusitis   . History of medication noncompliance   . Chronic abdominal pain   . DDD (degenerative disc disease), lumbar   . Aortic dissection     a. admx 04/2014 >> L renal infarct; a/c renal failure >> b.  s/p Bioprosthetic Bentall and total arch replacement and staged endovascular repair of descending aortic aneurysm ([facility] - Dr. [name])  . CAD (coronary artery disease)     a. LHC 4/16:  oD1 60%    Past Surgical History  Procedure Laterality Date  . Ankle surgery      Fractures bilaterally  . Inguinal hernia repair Right ~ 1996  . Foot fracture surgery Bilateral 2004-2010    "got pins in both of them"  . Left heart catheterization with coronary angiogram N/A 06/21/2014    Procedure: LEFT HEART CATHETERIZATION WITH CORONARY ANGIOGRAM;  Surgeon: Larey Dresser, MD;  Location: [facility] CATH LAB;  Service: Cardiovascular;  Laterality: N/A;  . Aortic valve surgery       Current Outpatient Prescriptions  Medication Sig Dispense Refill  . acetaminophen (TYLENOL) 500 MG tablet Take 1,500 mg by mouth 2 (two) times daily as needed for moderate pain.    [name] amLODipine (NORVASC) 10 MG tablet Take 10 mg by mouth  daily.    [name] aspirin 81 MG tablet Take 81 mg by mouth daily.    [name] atorvastatin (LIPITOR) 20 MG tablet Take 20 mg by mouth daily.  0  . cloNIDine (CATAPRES) 0.2 MG tablet Take 0.2 mg by mouth 2 (two) times daily.    [name] gabapentin (NEURONTIN) 100 MG capsule Take 100 mg by mouth 2 (two) times daily.    [name] labetalol (NORMODYNE) 300 MG tablet Take 2 tablets (600 mg total) by mouth 2 (two) times daily. 240 tablet 6  . minoxidil (LONITEN) 2.5 MG tablet Take 2.5 mg by mouth daily.    [name] oxyCODONE (OXY IR/ROXICODONE) 5 MG immediate release tablet Take 5 mg by mouth every 4 (four) hours as needed. AS NEEDED FOR UP TO 10  DAYS PER [facility] PHYSICIAN    . polyethylene glycol (MIRALAX / GLYCOLAX) packet Take 17 g by mouth 3 (three) times daily as needed.  0  . [DISCONTINUED] nitroGLYCERIN (NITRODUR - DOSED IN MG/24 HR) 0.4 mg/hr patch Place 1 patch (0.4 mg total) onto the skin daily. 30 patch 1   No current facility-administered medications for this visit.    Allergies:   Imdur and Tramadol    Social History:  The patient  reports that he has been smoking Cigarettes.  He has a 5.75 pack-year smoking history. He has quit using smokeless tobacco. His smokeless tobacco use included Chew. He reports that he uses illicit drugs (Marijuana). He reports that he does not drink alcohol.   Family History:  The patient's family history includes Colon cancer in his paternal uncle; Diabetes in his maternal aunt; Heart attack in his brother; Hypertension in an other family member; Stroke in his maternal aunt. There is no history of Throat cancer, Pancreatic cancer, Esophageal cancer, Kidney disease, or Liver disease.    ROS:   Please see the history of present illness.   Review of Systems  Constitution: Positive for night sweats and weight loss.  HENT: Positive for headaches.   Cardiovascular: Positive for chest pain and dyspnea on exertion.  Respiratory: Positive for shortness of breath and wheezing.   Musculoskeletal:  Positive for back pain, joint pain and myalgias.  Gastrointestinal: Positive for abdominal pain and hematochezia.  Neurological: Positive for dizziness.  Psychiatric/Behavioral: Positive for depression.  All other systems reviewed and are negative.    PHYSICAL EXAM: VS:  BP 144/68 mmHg  Pulse 79  Ht 5\' 11"  (1.803 m)  Wt 144 lb (65.318 kg)  BMI 20.09 kg/m2     Wt Readings from Last 3 Encounters:  12/14/14 144 lb (65.318 kg)  12/09/14 147 lb (66.679 kg)  12/06/14 141 lb 4 oz (64.071 kg)     GEN: Well nourished, well developed, in no acute distress HEENT: normal Neck: no JVD, no masses Cardiac:  Normal S1/S2, RRR; no murmur, no rubs or gallops, no edema  Respiratory:  Decreased BS bilaterally, no wheezing, rhonchi or rales. GI: soft,  Diffuse tenderness, nondistended  MS: no deformity or atrophy Skin: warm and dry  Neuro:  CNs II-XII intact, Strength and sensation are intact Psych: Normal affect   EKG:  EKG is ordered today.  It demonstrates:   NSR, HR 79, LVH repol abnl   Recent Labs: 03/27/2014: B Natriuretic Peptide 550.7* 04/25/2014: TSH 0.724 05/03/2014: Magnesium 2.1 11/19/2014: ALT 17 12/09/2014: BUN 11; Creatinine, Ser 1.24; Hemoglobin 9.5*; Platelets 263; Potassium 4.0; Sodium 137    Lipid Panel    Component Value Date/Time   CHOL 182 03/27/2014 2316   TRIG 49 03/27/2014 2316   HDL 57 03/27/2014 2316   CHOLHDL 3.2 03/27/2014 2316   VLDL 10 03/27/2014 2316   LDLCALC 115* 03/27/2014 2316    ASSESSMENT AND PLAN:  1.  Aortic Dissection:   He is s/p bioprosthetic Bentall and total arch replacement and staged endovascular repair of the descending thoracic aortic aneurysm at [facility] with Dr. [name] and Dr. Verner Mould.  He was just discharged from [place] 2 days ago after admission with recurrent chest pain and uncontrolled hypertension.  Chest pain is stable and chronic since his Bentall and dissection repair.  He sees Dr. [name] in a few weeks.  He will need continued BP  control.  -  Get FU Echo  -  FU with Dr. [name] as  planned  -  SBE prophylaxis.   2.  HTN:  BP with fair control but should be better.  -  Continue Amlodipine, Catapres, Minoxidil  -  Increase Labetalol to 600 mg bid  3.  Non-Ischemic CM: EF 55-60% by echocardiogram 4/16.     4.  Chronic Combined Systolic and Diastolic CHF:  Volume stable.  He is not on diuretics.   5.  Chronic Kidney Disease:  Recent Creatinine stable.  6.  CAD:  Non-obstructive by [facility] 4/16.  No angina.  Chest pain all related to chest surgery.  Continue ASA, statin.  7.  Mitral Regurgitation:  Obtain FU echo.    Medication Adjustments: Current medicines are reviewed at length with the patient today.  Any concerns are outlined above.  The following changes have been made:   Discontinued Medications   CLONIDINE (CATAPRES) 0.2 MG TABLET    Take 1 tablet (0.2 mg total) by mouth 3 (three) times daily.   New Prescriptions   No medications on file   Modified Medications   Modified Medication Previous Medication   LABETALOL (NORMODYNE) 300 MG TABLET labetalol (NORMODYNE) 300 MG tablet      Take 2 tablets (600 mg total) by mouth 2 (two) times daily.    Take 300 mg by mouth 3 (three) times daily.    Labs/ tests ordered today include:  Orders Placed This Encounter  Procedures  . EKG 12-Lead  . Echocardiogram     Disposition:    FU Dr. [name] or me 6-8 weeks.     Signed, Versie Starks, MHS 12/14/2014 2:11 PM    [facility] [address], [place], [place]  [address] Phone: [phone]; Fax: [phone]

## 2014-12-14 ENCOUNTER — Encounter: Payer: Self-pay | Admitting: Physician Assistant

## 2014-12-14 ENCOUNTER — Ambulatory Visit (INDEPENDENT_AMBULATORY_CARE_PROVIDER_SITE_OTHER): Payer: Medicare Other | Admitting: Physician Assistant

## 2014-12-14 VITALS — BP 144/68 | HR 79 | Ht 71.0 in | Wt 144.0 lb

## 2014-12-14 DIAGNOSIS — I2510 Atherosclerotic heart disease of native coronary artery without angina pectoris: Secondary | ICD-10-CM

## 2014-12-14 DIAGNOSIS — N189 Chronic kidney disease, unspecified: Secondary | ICD-10-CM

## 2014-12-14 DIAGNOSIS — I359 Nonrheumatic aortic valve disorder, unspecified: Secondary | ICD-10-CM | POA: Diagnosis not present

## 2014-12-14 DIAGNOSIS — I10 Essential (primary) hypertension: Secondary | ICD-10-CM

## 2014-12-14 DIAGNOSIS — I7100 Dissection of unspecified site of aorta: Secondary | ICD-10-CM

## 2014-12-14 DIAGNOSIS — Z954 Presence of other heart-valve replacement: Secondary | ICD-10-CM

## 2014-12-14 DIAGNOSIS — I429 Cardiomyopathy, unspecified: Secondary | ICD-10-CM

## 2014-12-14 DIAGNOSIS — Z952 Presence of prosthetic heart valve: Secondary | ICD-10-CM

## 2014-12-14 DIAGNOSIS — I71012 Dissection of descending thoracic aorta: Secondary | ICD-10-CM

## 2014-12-14 DIAGNOSIS — I7101 Dissection of thoracic aorta: Secondary | ICD-10-CM

## 2014-12-14 DIAGNOSIS — I428 Other cardiomyopathies: Secondary | ICD-10-CM

## 2014-12-14 DIAGNOSIS — I340 Nonrheumatic mitral (valve) insufficiency: Secondary | ICD-10-CM

## 2014-12-14 MED ORDER — LABETALOL HCL 300 MG PO TABS: 600.0000 mg | ORAL_TABLET | Freq: Two times a day (BID) | ORAL | Status: DC

## 2014-12-14 NOTE — Patient Instructions (Addendum)
Medication Instructions:  1. INCREASE LABETALOL TO 600 MG TWICE DAILY; NEW RX SENT IN TODAY  Labwork: NONE  Testing/Procedures: Your physician has requested that you have an echocardiogram. Echocardiography is a painless test that uses sound waves to create images of your heart. It provides your doctor with information about the size and shape of your heart and how well your heart's chambers and valves are working. This procedure takes approximately one hour. There are no restrictions for this procedure.   Follow-Up: 02/05/15 @ 12:10 WITH SCOTT WEAVER, PAC  Any Other Special Instructions Will Be Listed Below (If Applicable).

## 2014-12-21 ENCOUNTER — Ambulatory Visit: Payer: Medicare Other | Admitting: Physician Assistant

## 2014-12-21 DIAGNOSIS — N183 Chronic kidney disease, stage 3 (moderate): Secondary | ICD-10-CM | POA: Diagnosis not present

## 2014-12-21 DIAGNOSIS — N280 Ischemia and infarction of kidney: Secondary | ICD-10-CM | POA: Diagnosis not present

## 2014-12-21 DIAGNOSIS — I10 Essential (primary) hypertension: Secondary | ICD-10-CM | POA: Diagnosis not present

## 2014-12-21 DIAGNOSIS — D649 Anemia, unspecified: Secondary | ICD-10-CM | POA: Diagnosis not present

## 2014-12-25 ENCOUNTER — Other Ambulatory Visit (HOSPITAL_COMMUNITY): Payer: Medicare Other

## 2014-12-26 ENCOUNTER — Other Ambulatory Visit (HOSPITAL_COMMUNITY): Payer: Self-pay | Admitting: *Deleted

## 2014-12-27 ENCOUNTER — Inpatient Hospital Stay (HOSPITAL_COMMUNITY): Admission: RE | Admit: 2014-12-27 | Payer: Medicare Other | Source: Ambulatory Visit

## 2015-01-02 ENCOUNTER — Encounter (HOSPITAL_COMMUNITY): Payer: Self-pay | Admitting: Neurology

## 2015-01-02 ENCOUNTER — Emergency Department (HOSPITAL_COMMUNITY): Payer: Medicare Other

## 2015-01-02 ENCOUNTER — Emergency Department (HOSPITAL_COMMUNITY)
Admission: EM | Admit: 2015-01-02 | Discharge: 2015-01-03 | Disposition: A | Discharge status: Home / Self Care | Payer: Medicare Other | Attending: Emergency Medicine | Admitting: Emergency Medicine

## 2015-01-02 DIAGNOSIS — K5900 Constipation, unspecified: Secondary | ICD-10-CM | POA: Diagnosis not present

## 2015-01-02 DIAGNOSIS — N189 Chronic kidney disease, unspecified: Secondary | ICD-10-CM | POA: Diagnosis not present

## 2015-01-02 DIAGNOSIS — G8929 Other chronic pain: Secondary | ICD-10-CM | POA: Diagnosis not present

## 2015-01-02 DIAGNOSIS — M545 Low back pain: Secondary | ICD-10-CM | POA: Insufficient documentation

## 2015-01-02 DIAGNOSIS — F419 Anxiety disorder, unspecified: Secondary | ICD-10-CM | POA: Diagnosis not present

## 2015-01-02 DIAGNOSIS — I5042 Chronic combined systolic (congestive) and diastolic (congestive) heart failure: Secondary | ICD-10-CM | POA: Diagnosis not present

## 2015-01-02 DIAGNOSIS — R109 Unspecified abdominal pain: Secondary | ICD-10-CM | POA: Diagnosis not present

## 2015-01-02 DIAGNOSIS — R0789 Other chest pain: Secondary | ICD-10-CM | POA: Diagnosis not present

## 2015-01-02 DIAGNOSIS — Z7982 Long term (current) use of aspirin: Secondary | ICD-10-CM | POA: Insufficient documentation

## 2015-01-02 DIAGNOSIS — R1084 Generalized abdominal pain: Secondary | ICD-10-CM | POA: Diagnosis not present

## 2015-01-02 DIAGNOSIS — R0602 Shortness of breath: Secondary | ICD-10-CM | POA: Diagnosis not present

## 2015-01-02 DIAGNOSIS — R079 Chest pain, unspecified: Secondary | ICD-10-CM | POA: Diagnosis not present

## 2015-01-02 DIAGNOSIS — K219 Gastro-esophageal reflux disease without esophagitis: Secondary | ICD-10-CM | POA: Diagnosis not present

## 2015-01-02 DIAGNOSIS — I129 Hypertensive chronic kidney disease with stage 1 through stage 4 chronic kidney disease, or unspecified chronic kidney disease: Secondary | ICD-10-CM | POA: Insufficient documentation

## 2015-01-02 DIAGNOSIS — I2510 Atherosclerotic heart disease of native coronary artery without angina pectoris: Secondary | ICD-10-CM | POA: Diagnosis not present

## 2015-01-02 DIAGNOSIS — Z952 Presence of prosthetic heart valve: Secondary | ICD-10-CM | POA: Diagnosis not present

## 2015-01-02 DIAGNOSIS — Z720 Tobacco use: Secondary | ICD-10-CM | POA: Diagnosis not present

## 2015-01-02 DIAGNOSIS — E785 Hyperlipidemia, unspecified: Secondary | ICD-10-CM | POA: Insufficient documentation

## 2015-01-02 DIAGNOSIS — Z8701 Personal history of pneumonia (recurrent): Secondary | ICD-10-CM | POA: Diagnosis not present

## 2015-01-02 DIAGNOSIS — Z79899 Other long term (current) drug therapy: Secondary | ICD-10-CM | POA: Insufficient documentation

## 2015-01-02 LAB — CBC
HEMATOCRIT: 29.4 % — AB (ref 39.0–52.0)
HEMOGLOBIN: 9.2 g/dL — AB (ref 13.0–17.0)
MCH: 26.7 pg (ref 26.0–34.0)
MCHC: 31.3 g/dL (ref 30.0–36.0)
MCV: 85.5 fL (ref 78.0–100.0)
Platelets: 157 10*3/uL (ref 150–400)
RBC: 3.44 MIL/uL — ABNORMAL LOW (ref 4.22–5.81)
RDW: 17.7 % — AB (ref 11.5–15.5)
WBC: 5.3 10*3/uL (ref 4.0–10.5)

## 2015-01-02 LAB — BASIC METABOLIC PANEL
ANION GAP: 10 (ref 5–15)
BUN: 8 mg/dL (ref 6–20)
CALCIUM: 9.1 mg/dL (ref 8.9–10.3)
CO2: 23 mmol/L (ref 22–32)
Chloride: 108 mmol/L (ref 101–111)
Creatinine, Ser: 1.33 mg/dL — ABNORMAL HIGH (ref 0.61–1.24)
GFR calc Af Amer: >60 mL/min (ref >60)
GLUCOSE: 132 mg/dL — AB (ref 65–99)
POTASSIUM: 3.4 mmol/L — AB (ref 3.5–5.1)
SODIUM: 141 mmol/L (ref 135–145)

## 2015-01-02 LAB — I-STAT TROPONIN, ED: TROPONIN I, POC: 0.01 ng/mL (ref 0.00–0.08)

## 2015-01-02 LAB — URINALYSIS, ROUTINE W REFLEX MICROSCOPIC
Bilirubin Urine: NEGATIVE
GLUCOSE, UA: NEGATIVE mg/dL
HGB URINE DIPSTICK: NEGATIVE
Ketones, ur: NEGATIVE mg/dL
LEUKOCYTES UA: NEGATIVE
Nitrite: NEGATIVE
PH: 6 (ref 5.0–8.0)
PROTEIN: 30 mg/dL — AB
SPECIFIC GRAVITY, URINE: 1.018 (ref 1.005–1.030)
Urobilinogen, UA: 0.2 mg/dL (ref 0.0–1.0)

## 2015-01-02 LAB — URINE MICROSCOPIC-ADD ON

## 2015-01-02 IMAGING — CT CT CTA ABD/PEL W/CM AND/OR W/O CM
1 of 2 series · 4 of 30 positions shown · IV contrast (Iohexol (Omnipaque 350))
Comparison: 12/09/2014.

CLINICAL DATA: Surgery for aortic dissection September 2014. Chest pain
and abdominal pain since surgery. Pain worsened in the last 3 days.
Shortness of breath.

EXAM:
CT ANGIOGRAPHY CHEST, ABDOMEN AND PELVIS
TECHNIQUE: Multidetector CT imaging through the chest was performed prior to IV
contrast administration. Multidetector CT imaging through the chest,
abdomen and pelvis was performed using the standard protocol during
bolus administration of intravenous contrast. Multiplanar
reconstructed images and MIPs were obtained and reviewed to evaluate
the vascular anatomy.
CONTRAST:  100mL OMNIPAQUE IOHEXOL 350 MG/ML SOLN

[Series 502: coronals, idose (1) · coronal · 0.45mm/px · 4 of 157 slices shown]
[im 25/157  lung]
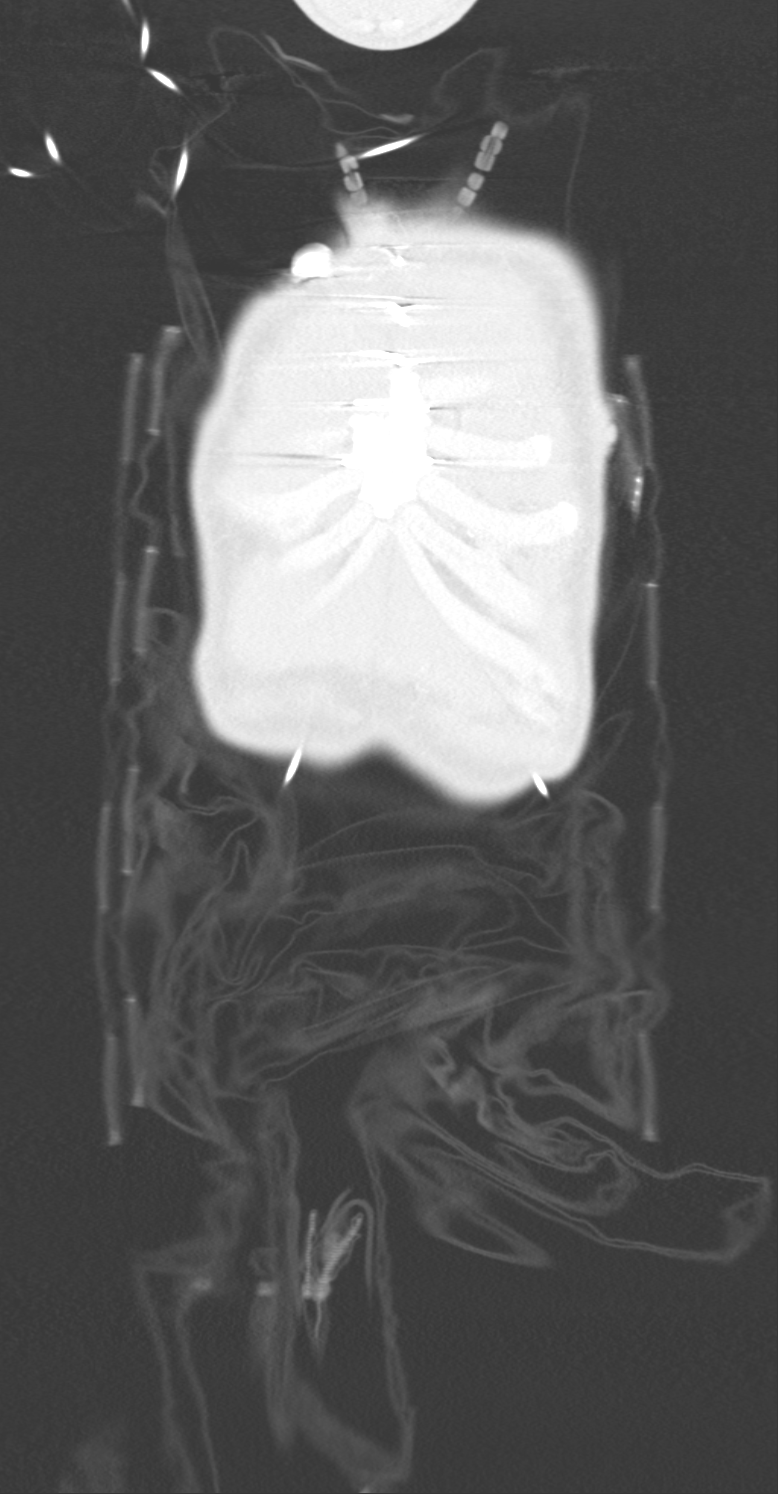
[im 61/157  mediastinal]
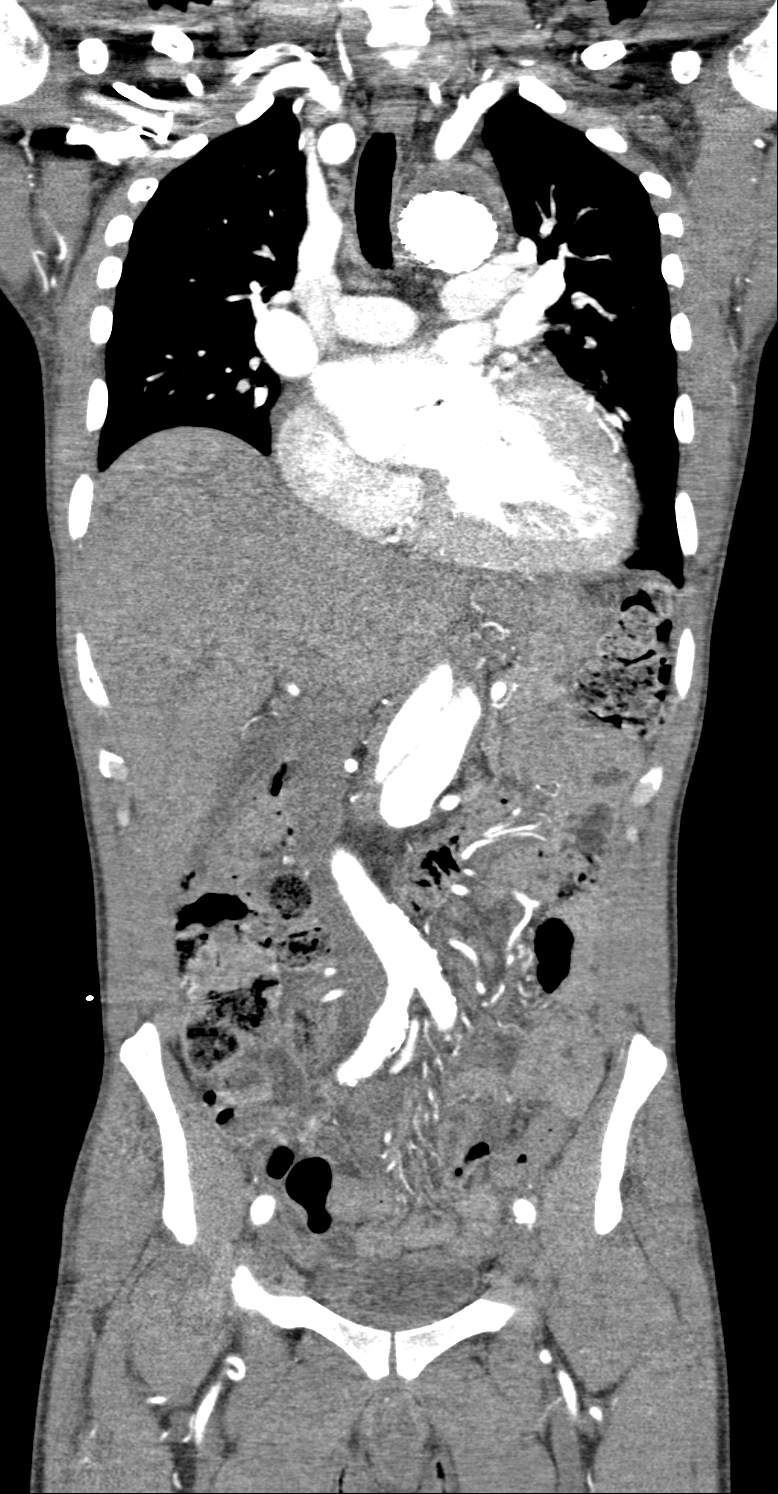
[im 97/157  lung]
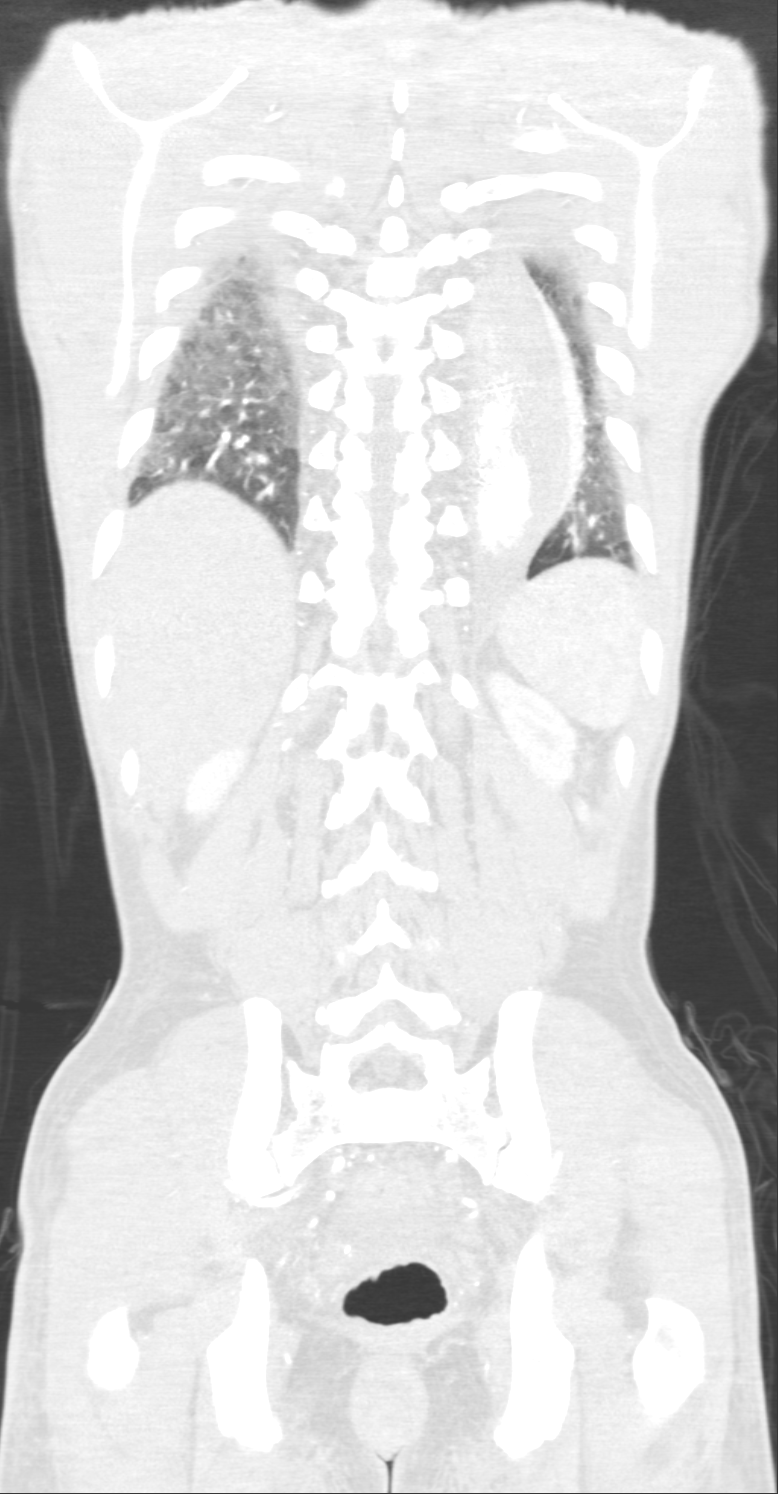
[im 133/157  mediastinal]
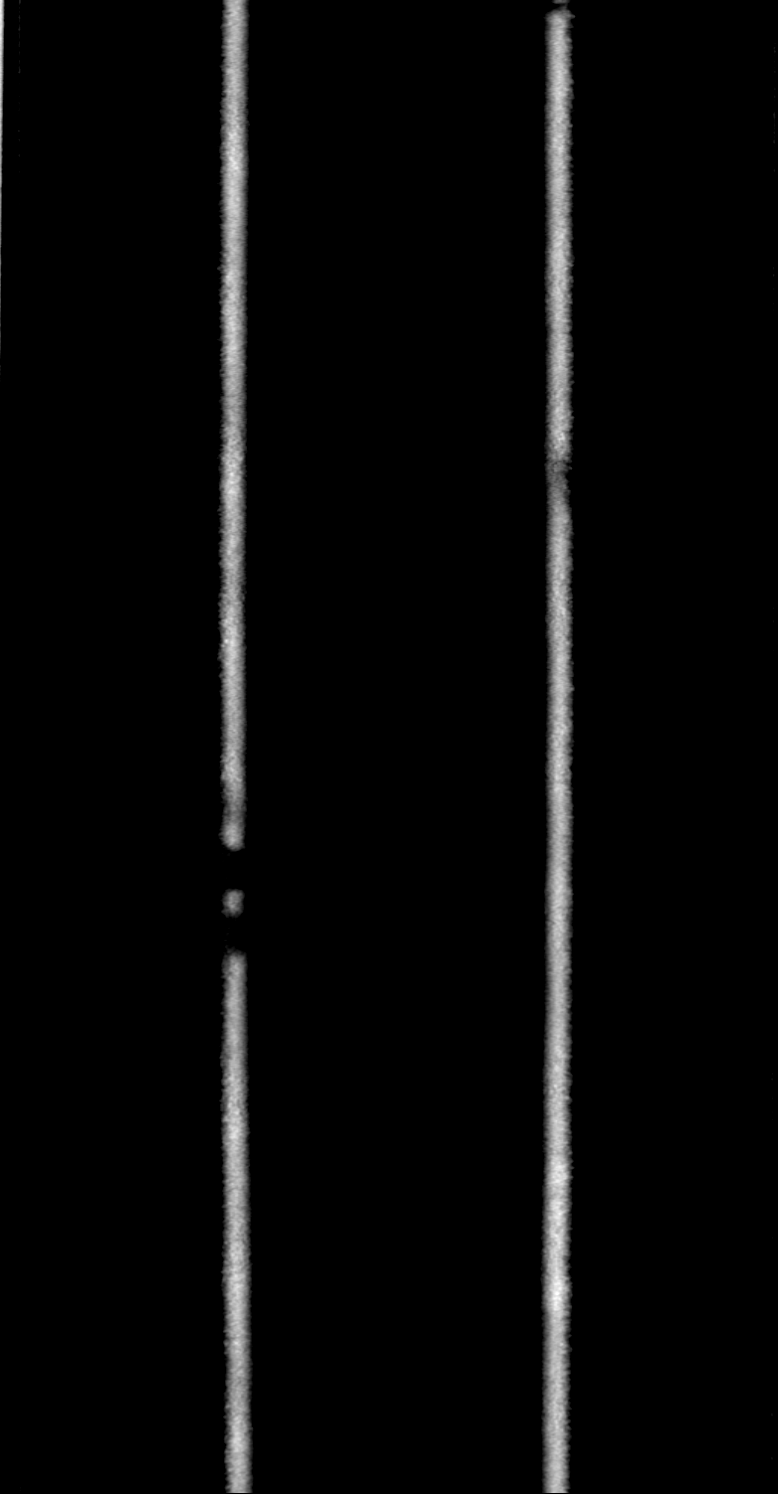

[4 of 30 positions shown; findings below may reference images not displayed]

FINDINGS: CTA CHEST FINDINGS

Mediastinum/Nodes: Patient is status post aortic valve replacement
with mildly aneurysmal sinuses of Valsalva, measuring up to 4.5 cm
(series 501, image 94), stable. Contrast is seen within the coronary
arteries bilaterally left coronary artery measures up to 1.4 cm,
stable. Endovascular stent graft repair is seen in the ascending
aorta as well as within the transverse and descending thoracic
aorta, as before. Descending thoracic aorta measures up to 4.7 x
cm (image 113), stable. There is reflux of contrast superiorly, into
the false lumen, with slightly greater superior extent than on the
prior exam (series 501, images 75-111). Heart is mildly enlarged. No
pericardial effusion. Mediastinal lymph nodes are not enlarged by CT
size criteria. No hilar or axillary adenopathy.

Lungs/Pleura: Tiny bilateral pleural effusions with minimal
dependent atelectasis bilaterally. Chronic appearing atelectasis is
seen in the medial left lower lobe. Lungs are otherwise clear.
Airway is unremarkable.

Musculoskeletal: No worrisome lytic or sclerotic lesions.

Review of the MIP images confirms the above findings.

CTA ABDOMEN AND PELVIS FINDINGS

Hepatobiliary: 5 mm low-attenuation lesion in the right hepatic lobe
is unchanged but too small to characterize. Liver and gallbladder
are otherwise unremarkable. No biliary ductal dilatation.

Pancreas: Negative.

Spleen: Negative.

Adrenals/Urinary Tract: Adrenal glands and right kidney are
unremarkable. Mild renal cortical scarring in the lower pole left
kidney. Bladder is low in volume.

Stomach/Bowel: Stomach, small bowel appendix are unremarkable. A
fair amount of stool is seen in the ascending and transverse colon,
suggesting constipation.

Vascular/Lymphatic: Endovascular stent graft within the descending
thoracic aorta extends into the suprarenal abdominal aorta. Contrast
is seen within the true and false lumens, as stated above. Native
infrarenal aortic aneurysm measures approximately 3.9 cm, stable.
The celiac trunk and left renal artery originate from the false
lumen. The superior mesenteric artery and right renal artery
originate from the true lumen. The dissection flap ends between the
origins of the superior and inferior mesenteric arteries. Left
common iliac artery measures 1.8 cm. Atherosclerotic calcification
of the arterial vasculature. Scattered lymph nodes are not enlarged
by CT size criteria.

Reproductive: Prostate is visualized.

Other: A 3.8 x 5.8 cm fluid collection is seen in the right groin,
grossly stable. Small pelvic free fluid.

Musculoskeletal: No worrisome lytic or sclerotic lesions.

Review of the MIP images confirms the above findings.
IMPRESSION: 1. Aortic valve replacement and endovascular stent graft repair of
previously seen type B aortic dissection. Other than filling of the
false lumen more superiorly within the descending thoracic aorta,
findings are stable from 12/09/2014.
2. Aneurysms involving the sinuses of Valsalva, left coronary
artery, descending thoracic aorta and abdominal aorta are stable.
3. Fair amount of stool in the colon is indicative of constipation.
4. Borderline aneurysmal left common iliac artery.
5. Fluid collection in the right groin is unchanged and likely a
postoperative seroma.
6. Small pelvic free fluid.
7. Trace bilateral pleural effusions.

## 2015-01-02 MED ORDER — CLONIDINE HCL 0.2 MG PO TABS
0.2000 mg | ORAL_TABLET | Freq: Two times a day (BID) | ORAL | Status: DC
  Administered 2015-01-02: 0.2 mg via ORAL
  Filled 2015-01-02: qty 1

## 2015-01-02 MED ORDER — HYDRALAZINE HCL 20 MG/ML IJ SOLN
10.0000 mg | Freq: Once | INTRAMUSCULAR | Status: AC
  Administered 2015-01-02: 10 mg via INTRAVENOUS
  Filled 2015-01-02: qty 1

## 2015-01-02 MED ORDER — OXYCODONE-ACETAMINOPHEN 5-325 MG PO TABS
1.0000 | ORAL_TABLET | Freq: Once | ORAL | Status: DC
  Filled 2015-01-02: qty 1

## 2015-01-02 MED ORDER — IOHEXOL 350 MG/ML SOLN
100.0000 mL | Freq: Once | INTRAVENOUS | Status: AC | PRN
Start: 2015-01-02 — End: 2015-01-02
  Administered 2015-01-02: 100 mL via INTRAVENOUS

## 2015-01-02 MED ORDER — ONDANSETRON 4 MG PO TBDP
4.0000 mg | ORAL_TABLET | Freq: Once | ORAL | Status: AC
  Administered 2015-01-02: 4 mg via ORAL
  Filled 2015-01-02: qty 1

## 2015-01-02 MED ORDER — CLONIDINE HCL 0.1 MG PO TABS
0.1000 mg | ORAL_TABLET | Freq: Once | ORAL | Status: AC
  Administered 2015-01-02: 0.1 mg via ORAL
  Filled 2015-01-02: qty 1

## 2015-01-02 MED ORDER — HYDROMORPHONE HCL 1 MG/ML IJ SOLN
1.0000 mg | Freq: Once | INTRAMUSCULAR | Status: AC
  Administered 2015-01-02: 1 mg via INTRAVENOUS
  Filled 2015-01-02: qty 1

## 2015-01-02 MED ORDER — LABETALOL HCL 300 MG PO TABS
600.0000 mg | ORAL_TABLET | Freq: Two times a day (BID) | ORAL | Status: DC
  Administered 2015-01-02: 600 mg via ORAL
  Filled 2015-01-02: qty 2

## 2015-01-02 MED ORDER — OXYCODONE-ACETAMINOPHEN 5-325 MG PO TABS
1.0000 | ORAL_TABLET | Freq: Once | ORAL | Status: AC
  Administered 2015-01-02: 1 via ORAL
  Filled 2015-01-02: qty 1

## 2015-01-02 NOTE — ED Provider Notes (Signed)
CSN: [id number]     Arrival date & time 01/02/15  1717 History   First MD Initiated Contact with Patient 01/02/15 1851     Chief Complaint  Patient presents with  . Chest Pain  . Back Pain   HPI  [name] is an 44 y.o. male with h/o aortic dissection s/p Bentall and TEVAR, CKD, HTN who presents to the ED for evaluation of abdominal, chest, and back pain.  He was seen at this ED in 11/2014 for similar complaints. CTA here revealed some aneurysmal dilation of his aorta and he was transferred to [facility] for further management. [name] reports while at [facility] they found nothing wrong during his workup and he was discharged home. [name] states that since his cardiac procedures earlier this year he has had intermittent pain but it is not usually this severe and is managable with acetaminophen. States that the past 3 days, however, his pain has been progressively worse and currently rates it a 10/10. States it is worth with deep breaths and movement. States it feels like the pain starts diffusely in his abdomen and spreads up to his R chest. States his entire mid and low back is in pain too. He reports that this pain feels similar to the pain immediately following his aortic procedures and similar to his presentation to this ED in 11/2014. In the past his pain flares were controlled with Percocet but he has no more at home. Denies fever, chills, N/V/D. States his appetite is the same as it normally is.   [name] is also markedly hypertensive in the ED. Will give home meds and monitor.  Past Medical History  Diagnosis Date  . Hypertension     a. Hx of HTN urgency secondary to noncompliance. b. urinary metanephrine and catecholeamine levels normal 2013.  c. Renal art [name] 1/16:  No evidence of renal artery stenosis noted bilaterally.  . Anxiety   . GERD (gastroesophageal reflux disease)   . Chronic combined systolic and diastolic congestive heart failure ([place])     a. 05/2011: Adm with pulm edema/HTN urgency, EF 35-40% with  diffuse hypokinesis and moderate to severe mitral regurgitation. Cardiomyopathy likely due to uncontrolled HTN and ETOH abuse - cath deferred due to renal insufficiency (felt due to uncontrolled HTN). bJodie Echevaria MV 06/2011: EF 37% and no ischemia or infarction. c. EF 45-50% by echo 01/2012.  . Cardiomyopathy ([place])     a. non-ischemic - probably related to untreated HTN and ETOH abuse - Echo 3/13 with EF 35-40% >> b. Echo 4/16: Severe LVH, EF 55-60%, moderate AI, moderate MR, mild LAE, trivial effusion, known type B dissection with communication between true and false lumens with suprasternal images suggesting dissection plane may propagate to at least left subclavian takeoff, root above aortic valve okay    . HYPERLIPIDEMIA   . INGUINAL HERNIA   . ETOH abuse     a. Reported to have quit 05/2011.  . Valvular heart disease     a. Echo 05/2011: moderate to severe eccentric MR and mild to moderate AI with prolapsing left coronary cusp. b. Echo 01/2012: mild-mod AI, mild dilitation of aortic root, mild MR.;  b. 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 CKD (chronic kidney disease)     a. 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  peak creatinine 3.46 during admx for aortic dissection 2/16  . Tobacco abuse   .  Pneumonia ~ 2013  . Headache(784.0)     "q other day" (08/08/2013)  . Chronic sinusitis   . History of medication noncompliance   . Chronic abdominal pain   . DDD (degenerative disc disease), lumbar   . Aortic dissection ([facility])     a. admx 04/2014 >> L renal infarct; a/c renal failure >> b.  s/p Bioprosthetic Bentall and total arch replacement and staged endovascular repair of descending aortic aneurysm ([facility] - Dr. [name])  . CAD (coronary artery disease)     a. LHC 4/16:  oD1 60%   Past Surgical History  Procedure Laterality Date  . Ankle surgery      Fractures bilaterally  . Inguinal hernia repair Right ~  1996  . Foot fracture surgery Bilateral 2004-2010    "got pins in both of them"  . Left heart catheterization with coronary angiogram N/A 06/21/2014    Procedure: LEFT HEART CATHETERIZATION WITH CORONARY ANGIOGRAM;  Surgeon: Larey Dresser, MD;  Location: [facility] CATH LAB;  Service: Cardiovascular;  Laterality: N/A;  . Aortic valve surgery     Family History  Problem Relation Age of Onset  . Hypertension    . Colon cancer Paternal Uncle   . Stroke Maternal Aunt   . Heart attack Brother   . Diabetes Maternal Aunt   . Throat cancer Neg Hx   . Pancreatic cancer Neg Hx   . Esophageal cancer Neg Hx   . Kidney disease Neg Hx   . Liver disease Neg Hx    Social History  Substance Use Topics  . Smoking status: Current Every Day Smoker -- 0.25 packs/day for 23 years    Types: Cigarettes    Last Attempt to Quit: 05/03/2014  . Smokeless tobacco: Former Systems developer    Types: Chew     Comment: as of 05-22-14 down to 3-4 cigs daily  . Alcohol Use: No     Comment: + previous use    Review of Systems  All other systems reviewed and are negative.     Allergies  Imdur and Tramadol  Home Medications   Prior to Admission medications   Medication Sig Start Date End Date Taking? Authorizing Provider  acetaminophen (TYLENOL) 500 MG tablet Take 1,500 mg by mouth 2 (two) times daily as needed for moderate pain.    Historical Provider, MD  amLODipine (NORVASC) 10 MG tablet Take 10 mg by mouth daily.    Historical Provider, MD  aspirin 81 MG tablet Take 81 mg by mouth daily.    Historical Provider, MD  atorvastatin (LIPITOR) 20 MG tablet Take 20 mg by mouth daily. 11/14/14   Historical Provider, MD  cloNIDine (CATAPRES) 0.2 MG tablet Take 0.2 mg by mouth 2 (two) times daily.    Historical Provider, MD  gabapentin (NEURONTIN) 100 MG capsule Take 100 mg by mouth 2 (two) times daily. 12/11/14 12/11/15  Historical Provider, MD  labetalol (NORMODYNE) 300 MG tablet Take 2 tablets (600 mg total) by mouth 2 (two) times  daily. 12/14/14   Liliane Shi, PA-C  minoxidil (LONITEN) 2.5 MG tablet Take 2.5 mg by mouth daily.    Historical Provider, MD  polyethylene glycol (MIRALAX / GLYCOLAX) packet Take 17 g by mouth 3 (three) times daily as needed. 11/01/14   Historical Provider, MD   BP 206/102 mmHg  Pulse 67  Temp(Src) 98.6 F (37 C) (Oral)  Resp 23  SpO2 100% Physical Exam  Constitutional: He is oriented to person, place, and time.  HENT:  Right Ear: External ear normal.  Left Ear: External ear normal.  Nose: Nose normal.  Mouth/Throat: Oropharynx is clear and moist. No oropharyngeal exudate.  Eyes: Conjunctivae and EOM are normal. Pupils are equal, round, and reactive to light.  Neck: Normal range of motion. Neck supple.  Cardiovascular: Normal rate, regular rhythm and intact distal pulses.   Murmur heard. 3/6 SEM   Pulmonary/Chest: Effort normal and breath sounds normal. No respiratory distress. He has no wheezes.  Abdominal: Soft. Bowel sounds are normal. He exhibits no distension. There is tenderness. There is no rebound and no guarding.  Diffusely TTP but abd remains soft. Bowel sounds normal.   Musculoskeletal: He exhibits no edema.  Lymphadenopathy:    He has no cervical adenopathy.  Neurological: He is alert and oriented to person, place, and time. No cranial nerve deficit.  Skin: Skin is warm and dry.  Psychiatric: He has a normal mood and affect.  Nursing note and vitals reviewed.  Filed Vitals:   01/02/15 2230  BP: 189/102  Pulse: 65  Temp:   Resp: 18     ED Course  Procedures (including critical care time) Labs Review Labs Reviewed  BASIC METABOLIC PANEL - Abnormal; Notable for the following:    Potassium 3.4 (*)    Glucose, Bld 132 (*)    Creatinine, Ser 1.33 (*)    All other components within normal limits  CBC - Abnormal; Notable for the following:    RBC 3.44 (*)    Hemoglobin 9.2 (*)    HCT 29.4 (*)    RDW 17.7 (*)    All other components within normal limits   URINALYSIS, ROUTINE W REFLEX MICROSCOPIC (NOT AT [facility]) - Abnormal; Notable for the following:    Protein, ur 30 (*)    All other components within normal limits  URINE MICROSCOPIC-ADD ON - Abnormal; Notable for the following:    Casts HYALINE CASTS (*)    All other components within normal limits  LIPASE, BLOOD  I-STAT TROPOININ, ED    Imaging Review Dg Chest 2 View  01/02/2015  CLINICAL DATA:  Right upper chest pain radiating to his back and abdomen for 3 days. Hypertension, aortic valve replacement and history of aortic dissection. EXAM: CHEST  2 VIEW COMPARISON:  CT chest 12/09/2014 and chest radiograph 11/19/2014. FINDINGS: Trachea is midline. Heart size stable. Endovascular stent graft is seen in the entire thoracic aorta. Aortic valve replacement. Lungs are clear. No pleural fluid. IMPRESSION: 1. No acute findings. 2. Endovascular stent graft repair of previous type B aortic dissection. Electronically Signed   By: Lorin Picket M.D.   On: 01/02/2015 18:09   Ct Angio Chest Aorta W/cm &/or Wo/cm  01/02/2015  CLINICAL DATA:  Surgery for aortic dissection July 2016. Chest pain and abdominal pain since surgery. Pain worsened in the last 3 days. Shortness of breath. EXAM: CT ANGIOGRAPHY CHEST, ABDOMEN AND PELVIS TECHNIQUE: Multidetector CT imaging through the chest was performed prior to IV contrast administration. Multidetector CT imaging through the chest, abdomen and pelvis was performed using the standard protocol during bolus administration of intravenous contrast. Multiplanar reconstructed images and MIPs were obtained and reviewed to evaluate the vascular anatomy. CONTRAST:  144mL OMNIPAQUE IOHEXOL 350 MG/ML SOLN COMPARISON:  12/09/2014. FINDINGS: CTA CHEST FINDINGS Mediastinum/Nodes: Patient is status post aortic valve replacement with mildly aneurysmal sinuses of Valsalva, measuring up to 4.5 cm (series 501, image 94), stable. Contrast is seen within the coronary arteries bilaterally  left coronary artery measures up  to 1.4 cm, stable. Endovascular stent graft repair is seen in the ascending aorta as well as within the transverse and descending thoracic aorta, as before. Descending thoracic aorta measures up to 4.7 x 5.2 cm (image 113), stable. There is reflux of contrast superiorly, into the false lumen, with slightly greater superior extent than on the prior exam (series 501, images 75-111). Heart is mildly enlarged. No pericardial effusion. Mediastinal lymph nodes are not enlarged by CT size criteria. No hilar or axillary adenopathy. Lungs/Pleura: Tiny bilateral pleural effusions with minimal dependent atelectasis bilaterally. Chronic appearing atelectasis is seen in the medial left lower lobe. Lungs are otherwise clear. Airway is unremarkable. Musculoskeletal: No worrisome lytic or sclerotic lesions. Review of the MIP images confirms the above findings. CTA ABDOMEN AND PELVIS FINDINGS Hepatobiliary: 5 mm low-attenuation lesion in the right hepatic lobe is unchanged but too small to characterize. Liver and gallbladder are otherwise unremarkable. No biliary ductal dilatation. Pancreas: Negative. Spleen: Negative. Adrenals/Urinary Tract: Adrenal glands and right kidney are unremarkable. Mild renal cortical scarring in the lower pole left kidney. Bladder is low in volume. Stomach/Bowel: Stomach, small bowel appendix are unremarkable. A fair amount of stool is seen in the ascending and transverse colon, suggesting constipation. Vascular/Lymphatic: Endovascular stent graft within the descending thoracic aorta extends into the suprarenal abdominal aorta. Contrast is seen within the true and false lumens, as stated above. Native infrarenal aortic aneurysm measures approximately 3.9 cm, stable. The celiac trunk and left renal artery originate from the false lumen. The superior mesenteric artery and right renal artery originate from the true lumen. The dissection flap ends between the origins of the  superior and inferior mesenteric arteries. Left common iliac artery measures 1.8 cm. Atherosclerotic calcification of the arterial vasculature. Scattered lymph nodes are not enlarged by CT size criteria. Reproductive: Prostate is visualized. Other: A 3.8 x 5.8 cm fluid collection is seen in the right groin, grossly stable. Small pelvic free fluid. Musculoskeletal: No worrisome lytic or sclerotic lesions. Review of the MIP images confirms the above findings. IMPRESSION: 1. Aortic valve replacement and endovascular stent graft repair of previously seen type B aortic dissection. Other than filling of the false lumen more superiorly within the descending thoracic aorta, findings are stable from 12/09/2014. 2. Aneurysms involving the sinuses of Valsalva, left coronary artery, descending thoracic aorta and abdominal aorta are stable. 3. Fair amount of stool in the colon is indicative of constipation. 4. Borderline aneurysmal left common iliac artery. 5. Fluid collection in the right groin is unchanged and likely a postoperative seroma. 6. Small pelvic free fluid. 7. Trace bilateral pleural effusions. Electronically Signed   By: Lorin Picket M.D.   On: 01/02/2015 20:38   Ct Cta Abd/pel W/cm &/or W/o Cm  01/02/2015  CLINICAL DATA:  Surgery for aortic dissection July 2016. Chest pain and abdominal pain since surgery. Pain worsened in the last 3 days. Shortness of breath. EXAM: CT ANGIOGRAPHY CHEST, ABDOMEN AND PELVIS TECHNIQUE: Multidetector CT imaging through the chest was performed prior to IV contrast administration. Multidetector CT imaging through the chest, abdomen and pelvis was performed using the standard protocol during bolus administration of intravenous contrast. Multiplanar reconstructed images and MIPs were obtained and reviewed to evaluate the vascular anatomy. CONTRAST:  122mL OMNIPAQUE IOHEXOL 350 MG/ML SOLN COMPARISON:  12/09/2014. FINDINGS: CTA CHEST FINDINGS Mediastinum/Nodes: Patient is status  post aortic valve replacement with mildly aneurysmal sinuses of Valsalva, measuring up to 4.5 cm (series 501, image 94), stable. Contrast is seen within the  coronary arteries bilaterally left coronary artery measures up to 1.4 cm, stable. Endovascular stent graft repair is seen in the ascending aorta as well as within the transverse and descending thoracic aorta, as before. Descending thoracic aorta measures up to 4.7 x 5.2 cm (image 113), stable. There is reflux of contrast superiorly, into the false lumen, with slightly greater superior extent than on the prior exam (series 501, images 75-111). Heart is mildly enlarged. No pericardial effusion. Mediastinal lymph nodes are not enlarged by CT size criteria. No hilar or axillary adenopathy. Lungs/Pleura: Tiny bilateral pleural effusions with minimal dependent atelectasis bilaterally. Chronic appearing atelectasis is seen in the medial left lower lobe. Lungs are otherwise clear. Airway is unremarkable. Musculoskeletal: No worrisome lytic or sclerotic lesions. Review of the MIP images confirms the above findings. CTA ABDOMEN AND PELVIS FINDINGS Hepatobiliary: 5 mm low-attenuation lesion in the right hepatic lobe is unchanged but too small to characterize. Liver and gallbladder are otherwise unremarkable. No biliary ductal dilatation. Pancreas: Negative. Spleen: Negative. Adrenals/Urinary Tract: Adrenal glands and right kidney are unremarkable. Mild renal cortical scarring in the lower pole left kidney. Bladder is low in volume. Stomach/Bowel: Stomach, small bowel appendix are unremarkable. A fair amount of stool is seen in the ascending and transverse colon, suggesting constipation. Vascular/Lymphatic: Endovascular stent graft within the descending thoracic aorta extends into the suprarenal abdominal aorta. Contrast is seen within the true and false lumens, as stated above. Native infrarenal aortic aneurysm measures approximately 3.9 cm, stable. The celiac trunk and  left renal artery originate from the false lumen. The superior mesenteric artery and right renal artery originate from the true lumen. The dissection flap ends between the origins of the superior and inferior mesenteric arteries. Left common iliac artery measures 1.8 cm. Atherosclerotic calcification of the arterial vasculature. Scattered lymph nodes are not enlarged by CT size criteria. Reproductive: Prostate is visualized. Other: A 3.8 x 5.8 cm fluid collection is seen in the right groin, grossly stable. Small pelvic free fluid. Musculoskeletal: No worrisome lytic or sclerotic lesions. Review of the MIP images confirms the above findings. IMPRESSION: 1. Aortic valve replacement and endovascular stent graft repair of previously seen type B aortic dissection. Other than filling of the false lumen more superiorly within the descending thoracic aorta, findings are stable from 12/09/2014. 2. Aneurysms involving the sinuses of Valsalva, left coronary artery, descending thoracic aorta and abdominal aorta are stable. 3. Fair amount of stool in the colon is indicative of constipation. 4. Borderline aneurysmal left common iliac artery. 5. Fluid collection in the right groin is unchanged and likely a postoperative seroma. 6. Small pelvic free fluid. 7. Trace bilateral pleural effusions. Electronically Signed   By: Lorin Picket M.D.   On: 01/02/2015 20:38   I have personally reviewed and evaluated these images and lab results as part of my medical decision-making.   EKG Interpretation   Date/Time:  Wednesday January 02 2015 17:25:54 EDT Ventricular Rate:  84 PR Interval:  142 QRS Duration: 106 QT Interval:  402 QTC Calculation: 475 R Axis:   59 Text Interpretation:  Normal sinus rhythm Biatrial enlargement Left  ventricular hypertrophy with repolarization abnormality No significant  change since last tracing Confirmed by Maryan Rued  MD, Loree Fee ([id number]) on  01/02/2015 6:56:54 PM      MDM   Final  diagnoses:  Chest pain, unspecified chest pain type  Abdominal pain, unspecified abdominal location  Constipation, unspecified constipation type    Discussed with [name] that pts tend to have chronic  pain even after aortic graft and TEVAR. EKG and trop neg. CXR unremarkable. Will get CTA chest and abd/pelv. Discussed with [name] that as long as everything normal we can discharge home w/ short supply of Percocet but he will need to f/u with PCP for ongoing pain management.   Imaging reveals no acute processes. However, BP remains elevated so will treat with Clonidine until BP in acceptable range.   BP down to 155/88 after IV Hydralazine. Discharged home with instructions to monitor BP     Anne Ng, [place] 01/03/15 0017  Noemi Chapel, MD 01/05/15 1227

## 2015-01-02 NOTE — ED Notes (Signed)
Pt reports right sided cp and middle back pain for several days. Has cardiac hx. Denies n/v/d. Feels sob. Pt is a x 4

## 2015-01-03 MED ORDER — OXYCODONE-ACETAMINOPHEN 5-325 MG PO TABS: 1.0000 | ORAL_TABLET | ORAL | Status: DC | PRN

## 2015-01-03 MED ORDER — POLYETHYLENE GLYCOL 3350 17 G PO PACK: 17.0000 g | PACK | Freq: Every day | ORAL | Status: DC

## 2015-01-03 NOTE — ED Notes (Signed)
Reviewed discharge instructions and prescription medication with patient, patient verbalized understanding.

## 2015-01-03 NOTE — Discharge Instructions (Signed)
Please follow-up with your primary care provider for long term pain management.  Keep track of your blood pressure daily. Avoid too much salt. Exercise as tolerated. If BP remains elevated you will need to follow up with your primary care provider for BP management.  Please obtain all of your results from medical records or have your doctors office obtain the results - share them with your doctor - you should be seen at your doctors office in the next 2 days. Call today to arrange your follow up. Take the medications as prescribed. Please review all of the medicines and only take them if you do not have an allergy to them. Please be aware that if you are taking birth control pills, taking other prescriptions, ESPECIALLY ANTIBIOTICS may make the birth control ineffective - if this is the case, either do not engage in sexual activity or use alternative methods of birth control such as condoms until you have finished the medicine and your family doctor says it is OK to restart them. If you are on a blood thinner such as COUMADIN, be aware that any other medicine that you take may cause the coumadin to either work too much, or not enough - you should have your coumadin level rechecked in next 7 days if this is the case.  ?  It is also a possibility that you have an allergic reaction to any of the medicines that you have been prescribed - Everybody reacts differently to medications and while MOST people have no trouble with most medicines, you may have a reaction such as nausea, vomiting, rash, swelling, shortness of breath. If this is the case, please stop taking the medicine immediately and contact your physician.  ?  You should return to the ER if you develop severe or worsening symptoms.

## 2015-01-07 ENCOUNTER — Telehealth (HOSPITAL_COMMUNITY): Payer: Self-pay | Admitting: *Deleted

## 2015-01-23 ENCOUNTER — Ambulatory Visit: Payer: Medicare Other | Admitting: Gastroenterology

## 2015-01-23 DIAGNOSIS — D649 Anemia, unspecified: Secondary | ICD-10-CM

## 2015-01-23 NOTE — Progress Notes (Addendum)
On admission patient's b/p- right arm -200/134, left arm 210/140. Patient stating he had some chest discomfort at his"stent site" in the left. Currently no further pain or dyspnea. Patient took his bp medication hydralazine, Clonidine,and labetalol at 1330. At 0800 patient ate a egg and sausage burrito. When questioned regarding prep, patient took two bottles this morning at 1130 and 1200 of "white fluid with blueberries on label."patient stating his bowel movements are brown. Patient had been scheduled for a CT scan, took his contrast for his prep. Discussed with Dr. Fuller Plan. Procedure is cancelled per Dr Fuller Plan. Patient to report to ED for uncontrolled HTN. Patient may go by private care related patient is nonsymptomatic.Wife brought back to admitting and instructions given.1445 b/p 190/120 manual, right arm. Patient will need to follow up with his PCP prior to rescheduled Colonoscopy for bp control. Appointment made for Previsit and colonoscopy in December. Additional contrast and instruction sheet given to wife for future CT scan. Marisue Humble, CMA informed of need for CT scan. Patient to ED with family transportation at 1445.

## 2015-01-25 DIAGNOSIS — Z95828 Presence of other vascular implants and grafts: Secondary | ICD-10-CM | POA: Diagnosis not present

## 2015-01-25 DIAGNOSIS — I7102 Dissection of abdominal aorta: Secondary | ICD-10-CM | POA: Diagnosis not present

## 2015-01-25 DIAGNOSIS — Z7982 Long term (current) use of aspirin: Secondary | ICD-10-CM | POA: Diagnosis not present

## 2015-01-25 DIAGNOSIS — Z79899 Other long term (current) drug therapy: Secondary | ICD-10-CM | POA: Diagnosis not present

## 2015-01-25 DIAGNOSIS — I129 Hypertensive chronic kidney disease with stage 1 through stage 4 chronic kidney disease, or unspecified chronic kidney disease: Secondary | ICD-10-CM | POA: Diagnosis not present

## 2015-01-25 DIAGNOSIS — N189 Chronic kidney disease, unspecified: Secondary | ICD-10-CM | POA: Diagnosis not present

## 2015-01-25 DIAGNOSIS — I7101 Dissection of thoracic aorta: Secondary | ICD-10-CM | POA: Diagnosis not present

## 2015-01-25 DIAGNOSIS — F17200 Nicotine dependence, unspecified, uncomplicated: Secondary | ICD-10-CM | POA: Diagnosis not present

## 2015-02-02 ENCOUNTER — Emergency Department (HOSPITAL_COMMUNITY)
Admission: EM | Admit: 2015-02-02 | Discharge: 2015-02-02 | Disposition: A | Discharge status: Home / Self Care | Payer: Medicare Other | Attending: Emergency Medicine | Admitting: Emergency Medicine

## 2015-02-02 ENCOUNTER — Emergency Department (HOSPITAL_COMMUNITY): Payer: Medicare Other

## 2015-02-02 ENCOUNTER — Encounter (HOSPITAL_COMMUNITY): Payer: Self-pay | Admitting: Emergency Medicine

## 2015-02-02 DIAGNOSIS — Z9889 Other specified postprocedural states: Secondary | ICD-10-CM | POA: Insufficient documentation

## 2015-02-02 DIAGNOSIS — N189 Chronic kidney disease, unspecified: Secondary | ICD-10-CM | POA: Insufficient documentation

## 2015-02-02 DIAGNOSIS — I5042 Chronic combined systolic (congestive) and diastolic (congestive) heart failure: Secondary | ICD-10-CM | POA: Diagnosis not present

## 2015-02-02 DIAGNOSIS — F17210 Nicotine dependence, cigarettes, uncomplicated: Secondary | ICD-10-CM | POA: Insufficient documentation

## 2015-02-02 DIAGNOSIS — F419 Anxiety disorder, unspecified: Secondary | ICD-10-CM | POA: Diagnosis not present

## 2015-02-02 DIAGNOSIS — R1013 Epigastric pain: Secondary | ICD-10-CM | POA: Insufficient documentation

## 2015-02-02 DIAGNOSIS — I2510 Atherosclerotic heart disease of native coronary artery without angina pectoris: Secondary | ICD-10-CM | POA: Diagnosis not present

## 2015-02-02 DIAGNOSIS — Z8709 Personal history of other diseases of the respiratory system: Secondary | ICD-10-CM | POA: Diagnosis not present

## 2015-02-02 DIAGNOSIS — R109 Unspecified abdominal pain: Secondary | ICD-10-CM | POA: Diagnosis present

## 2015-02-02 DIAGNOSIS — Z8719 Personal history of other diseases of the digestive system: Secondary | ICD-10-CM | POA: Insufficient documentation

## 2015-02-02 DIAGNOSIS — Z9119 Patient's noncompliance with other medical treatment and regimen: Secondary | ICD-10-CM | POA: Diagnosis not present

## 2015-02-02 DIAGNOSIS — I7100 Dissection of unspecified site of aorta: Secondary | ICD-10-CM

## 2015-02-02 DIAGNOSIS — Z79899 Other long term (current) drug therapy: Secondary | ICD-10-CM | POA: Insufficient documentation

## 2015-02-02 DIAGNOSIS — Z8639 Personal history of other endocrine, nutritional and metabolic disease: Secondary | ICD-10-CM | POA: Insufficient documentation

## 2015-02-02 DIAGNOSIS — M549 Dorsalgia, unspecified: Secondary | ICD-10-CM | POA: Insufficient documentation

## 2015-02-02 DIAGNOSIS — M545 Low back pain: Secondary | ICD-10-CM | POA: Diagnosis not present

## 2015-02-02 DIAGNOSIS — I158 Other secondary hypertension: Secondary | ICD-10-CM | POA: Diagnosis not present

## 2015-02-02 DIAGNOSIS — R1031 Right lower quadrant pain: Secondary | ICD-10-CM | POA: Insufficient documentation

## 2015-02-02 DIAGNOSIS — Z8701 Personal history of pneumonia (recurrent): Secondary | ICD-10-CM | POA: Diagnosis not present

## 2015-02-02 DIAGNOSIS — G8929 Other chronic pain: Secondary | ICD-10-CM | POA: Insufficient documentation

## 2015-02-02 DIAGNOSIS — R011 Cardiac murmur, unspecified: Secondary | ICD-10-CM | POA: Diagnosis not present

## 2015-02-02 DIAGNOSIS — R1011 Right upper quadrant pain: Secondary | ICD-10-CM | POA: Insufficient documentation

## 2015-02-02 HISTORY — DX: Disorder of kidney and ureter, unspecified: N28.9

## 2015-02-02 LAB — COMPREHENSIVE METABOLIC PANEL
ALT: 17 U/L (ref 17–63)
AST: 17 U/L (ref 15–41)
Albumin: 3.6 g/dL (ref 3.5–5.0)
Alkaline Phosphatase: 128 U/L — ABNORMAL HIGH (ref 38–126)
Anion gap: 7 (ref 5–15)
BILIRUBIN TOTAL: 0.3 mg/dL (ref 0.3–1.2)
BUN: 11 mg/dL (ref 6–20)
CALCIUM: 9.4 mg/dL (ref 8.9–10.3)
CO2: 24 mmol/L (ref 22–32)
Chloride: 107 mmol/L (ref 101–111)
Creatinine, Ser: 1.44 mg/dL — ABNORMAL HIGH (ref 0.61–1.24)
GFR calc Af Amer: >60 mL/min (ref >60)
GFR, EST NON AFRICAN AMERICAN: 59 mL/min — AB (ref >60)
Glucose, Bld: 101 mg/dL — ABNORMAL HIGH (ref 65–99)
POTASSIUM: 3.8 mmol/L (ref 3.5–5.1)
Sodium: 138 mmol/L (ref 135–145)
TOTAL PROTEIN: 7.7 g/dL (ref 6.5–8.1)

## 2015-02-02 LAB — URINALYSIS, ROUTINE W REFLEX MICROSCOPIC
Bilirubin Urine: NEGATIVE
Glucose, UA: NEGATIVE mg/dL
Hgb urine dipstick: NEGATIVE
KETONES UR: NEGATIVE mg/dL
LEUKOCYTES UA: NEGATIVE
NITRITE: NEGATIVE
PROTEIN: NEGATIVE mg/dL
Specific Gravity, Urine: 1.022 (ref 1.005–1.030)
pH: 7 (ref 5.0–8.0)

## 2015-02-02 LAB — CBC
HEMATOCRIT: 35.1 % — AB (ref 39.0–52.0)
Hemoglobin: 10.7 g/dL — ABNORMAL LOW (ref 13.0–17.0)
MCH: 25.6 pg — ABNORMAL LOW (ref 26.0–34.0)
MCHC: 30.5 g/dL (ref 30.0–36.0)
MCV: 84 fL (ref 78.0–100.0)
PLATELETS: 124 10*3/uL — AB (ref 150–400)
RBC: 4.18 MIL/uL — ABNORMAL LOW (ref 4.22–5.81)
RDW: 17.1 % — AB (ref 11.5–15.5)
WBC: 4.9 10*3/uL (ref 4.0–10.5)

## 2015-02-02 LAB — LIPASE, BLOOD: Lipase: 23 U/L (ref 11–51)

## 2015-02-02 MED ORDER — HYDRALAZINE HCL 50 MG PO TABS
75.0000 mg | ORAL_TABLET | Freq: Once | ORAL | Status: AC
  Administered 2015-02-02: 75 mg via ORAL
  Filled 2015-02-02: qty 1

## 2015-02-02 MED ORDER — IOHEXOL 350 MG/ML SOLN
100.0000 mL | Freq: Once | INTRAVENOUS | Status: AC | PRN
Start: 2015-02-02 — End: 2015-02-02
  Administered 2015-02-02: 100 mL via INTRAVENOUS

## 2015-02-02 MED ORDER — HYDROMORPHONE HCL 1 MG/ML IJ SOLN
1.0000 mg | Freq: Once | INTRAMUSCULAR | Status: AC
  Administered 2015-02-02: 1 mg via INTRAVENOUS
  Filled 2015-02-02: qty 1

## 2015-02-02 MED ORDER — ONDANSETRON HCL 4 MG/2ML IJ SOLN
4.0000 mg | Freq: Once | INTRAMUSCULAR | Status: AC
  Administered 2015-02-02: 4 mg via INTRAVENOUS
  Filled 2015-02-02: qty 2

## 2015-02-02 MED ORDER — OXYCODONE-ACETAMINOPHEN 5-325 MG PO TABS: 2.0000 | ORAL_TABLET | ORAL | Status: DC | PRN

## 2015-02-02 MED ORDER — AMLODIPINE BESYLATE 5 MG PO TABS
10.0000 mg | ORAL_TABLET | Freq: Once | ORAL | Status: AC
  Administered 2015-02-02: 10 mg via ORAL
  Filled 2015-02-02: qty 2

## 2015-02-02 MED ORDER — MORPHINE SULFATE (PF) 4 MG/ML IV SOLN
6.0000 mg | Freq: Once | INTRAVENOUS | Status: AC
  Administered 2015-02-02: 6 mg via INTRAVENOUS
  Filled 2015-02-02: qty 2

## 2015-02-02 MED ORDER — CLONIDINE HCL 0.2 MG PO TABS
0.2000 mg | ORAL_TABLET | Freq: Once | ORAL | Status: AC
Start: 2015-02-02 — End: 2015-02-02
  Administered 2015-02-02: 0.2 mg via ORAL
  Filled 2015-02-02: qty 1

## 2015-02-02 MED ORDER — MORPHINE SULFATE (PF) 4 MG/ML IV SOLN
4.0000 mg | Freq: Once | INTRAVENOUS | Status: AC
  Administered 2015-02-02: 4 mg via INTRAVENOUS
  Filled 2015-02-02: qty 1

## 2015-02-02 MED ORDER — LABETALOL HCL 5 MG/ML IV SOLN
20.0000 mg | Freq: Once | INTRAVENOUS | Status: AC
  Administered 2015-02-02: 20 mg via INTRAVENOUS
  Filled 2015-02-02: qty 4

## 2015-02-02 NOTE — ED Notes (Signed)
Pt to CT on cardiac monitor, RN at bedside.

## 2015-02-02 NOTE — ED Notes (Signed)
Pt aware of urine sample needed.

## 2015-02-02 NOTE — ED Notes (Signed)
Pt using bedside urinal at the time. 5/10 abdominal pain radiating to back, states he feels much better after receiving medication.

## 2015-02-02 NOTE — Discharge Instructions (Signed)
°  Abdominal Pain, Adult Follow-up with Dr. [name] at [facility] on Tuesday. Return for chest pain, shortness of breath, or increased abdominal pain. Your medication dosage has been changed by your cardiologist. Take: Clonidine to .4mg  twice a day Hydrolazine to 75mg  three times a day  Many things can cause belly (abdominal) pain. Most times, the belly pain is not dangerous. Many cases of belly pain can be watched and treated at home. HOME CARE   Do not take medicines that help you go poop (laxatives) unless told to by your doctor.  Only take medicine as told by your doctor.  Eat or drink as told by your doctor. Your doctor will tell you if you should be on a special diet. GET HELP IF:  You do not know what is causing your belly pain.  You have belly pain while you are sick to your stomach (nauseous) or have runny poop (diarrhea).  You have pain while you pee or poop.  Your belly pain wakes you up at night.  You have belly pain that gets worse or better when you eat.  You have belly pain that gets worse when you eat fatty foods.  You have a fever. GET HELP RIGHT AWAY IF:   The pain does not go away within 2 hours.  You keep throwing up (vomiting).  The pain changes and is only in the right or left part of the belly.  You have bloody or tarry looking poop. MAKE SURE YOU:   Understand these instructions.  Will watch your condition.  Will get help right away if you are not doing well or get worse.   This information is not intended to replace advice given to you by your health care provider. Make sure you discuss any questions you have with your health care provider.   Document Released: 08/19/2007 Document Revised: 03/23/2014 Document Reviewed: 11/09/2012 Elsevier Interactive Patient Education [employer].

## 2015-02-02 NOTE — ED Notes (Signed)
Pt reports abdominal pain radiating to back, onset last night. No nausea/vomiting/diarrhea. 9/10 pain upon arrival to ED. Abdomen soft and non tender to palpation. Bowel sounds present all quadrants.

## 2015-02-02 NOTE — ED Notes (Signed)
Spoke to [employer]) about labs, she does not want to wait for labs as this is an emergency and there is high suspicion pt has an aortic dissection.

## 2015-02-02 NOTE — ED Provider Notes (Signed)
CSN: [id number]     Arrival date & time 02/02/15  [id number] History   First MD Initiated Contact with Patient 02/02/15 [phone]     Chief Complaint  Patient presents with  . Abdominal Pain    (Consider location/radiation/quality/duration/timing/severity/associated sxs/prior Treatment) HPI [name] is a 44 y.o male with a history of hypertension, left heart cath, aortic valve repair, cardiomyopathy, CHF, hyperlipidemia, CKD, and thoracic aortic dissection with TEVAR and bentall who presents for worsening right-sided abdominal pain radiating to his back since last night. He denies any treatment prior to arrival. He states he has not had pain like this since his surgery in July. He was seen in the ED in September for similar complaints. He had a CTA which revealed aneurysmal dilation of his aorta and he was transferred to [facility] for further management. He reports that while at [facility] they did not find anything wrong with him and he was discharged home. He came to the ED in October and stated that his pain had progressively worsened.  He had a CTA of his abdomen and pelvis which was normal at that time.   Past Medical History  Diagnosis Date  . Hypertension     a. Hx of HTN urgency secondary to noncompliance. b. urinary metanephrine and catecholeamine levels normal 2013.  c. Renal art [name] 1/16:  No evidence of renal artery stenosis noted bilaterally.  . Anxiety   . GERD (gastroesophageal reflux disease)   . Chronic combined systolic and diastolic congestive heart failure ([place])     a. 05/2011: Adm with pulm edema/HTN urgency, EF 35-40% with diffuse hypokinesis and moderate to severe mitral regurgitation. Cardiomyopathy likely due to uncontrolled HTN and ETOH abuse - cath deferred due to renal insufficiency (felt due to uncontrolled HTN). bJodie Echevaria MV 06/2011: EF 37% and no ischemia or infarction. c. EF 45-50% by echo 01/2012.  . Cardiomyopathy ([place])     a. non-ischemic - probably related to untreated HTN and  ETOH abuse - Echo 3/13 with EF 35-40% >> b. Echo 4/16: Severe LVH, EF 55-60%, moderate AI, moderate MR, mild LAE, trivial effusion, known type B dissection with communication between true and false lumens with suprasternal images suggesting dissection plane may propagate to at least left subclavian takeoff, root above aortic valve okay    . HYPERLIPIDEMIA   . INGUINAL HERNIA   . ETOH abuse     a. Reported to have quit 05/2011.  . Valvular heart disease     a. Echo 05/2011: moderate to severe eccentric MR and mild to moderate AI with prolapsing left coronary cusp. b. Echo 01/2012: mild-mod AI, mild dilitation of aortic root, mild MR.;  b. 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 CKD (chronic kidney disease)     a. 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  peak creatinine 3.46 during admx for aortic dissection 2/16  . Tobacco abuse   . Pneumonia ~ 2013  . Headache(784.0)     "q other day" (08/08/2013)  . Chronic sinusitis   . History of medication noncompliance   . Chronic abdominal pain   . DDD (degenerative disc disease), lumbar   . Aortic dissection ([facility])     a. admx 04/2014 >> L renal infarct; a/c renal failure >> b.  s/p Bioprosthetic Bentall and total arch replacement and staged endovascular repair of descending aortic aneurysm ([facility] - Dr. [name])  . CAD (  coronary artery disease)     a. LHC 4/16:  oD1 60%  . Renal insufficiency    Past Surgical History  Procedure Laterality Date  . Ankle surgery      Fractures bilaterally  . Inguinal hernia repair Right ~ 1996  . Foot fracture surgery Bilateral 2004-2010    "got pins in both of them"  . Left heart catheterization with coronary angiogram N/A 06/21/2014    Procedure: LEFT HEART CATHETERIZATION WITH CORONARY ANGIOGRAM;  Surgeon: Larey Dresser, MD;  Location: [facility] CATH LAB;  Service: Cardiovascular;  Laterality: N/A;  . Aortic valve surgery      Family History  Problem Relation Age of Onset  . Hypertension    . Colon cancer Paternal Uncle   . Stroke Maternal Aunt   . Heart attack Brother   . Diabetes Maternal Aunt   . Throat cancer Neg Hx   . Pancreatic cancer Neg Hx   . Esophageal cancer Neg Hx   . Kidney disease Neg Hx   . Liver disease Neg Hx    Social History  Substance Use Topics  . Smoking status: Current Every Day Smoker -- 0.25 packs/day for 23 years    Types: Cigarettes    Last Attempt to Quit: 05/03/2014  . Smokeless tobacco: Former Systems developer    Types: Chew     Comment: as of 05-22-14 down to 3-4 cigs daily  . Alcohol Use: No     Comment: + previous use    Review of Systems  Constitutional: Negative for fever.  Respiratory: Negative for shortness of breath.   Cardiovascular: Negative for chest pain.  Gastrointestinal: Positive for abdominal pain. Negative for nausea, vomiting and diarrhea.  Musculoskeletal: Positive for back pain.  Neurological: Negative for dizziness and syncope.      Allergies  Imdur and Tramadol  Home Medications   Prior to Admission medications   Medication Sig Start Date End Date Taking? Authorizing Provider  amLODipine (NORVASC) 10 MG tablet Take 10 mg by mouth 2 (two) times daily.    Yes Historical Provider, MD  cloNIDine (CATAPRES) 0.2 MG tablet Take 0.2 mg by mouth 2 (two) times daily.   Yes Historical Provider, MD  gabapentin (NEURONTIN) 100 MG capsule Take 100 mg by mouth 2 (two) times daily. 12/11/14 12/11/15 Yes Historical Provider, MD  hydrALAZINE (APRESOLINE) 50 MG tablet Take 50 mg by mouth 2 (two) times daily. 12/04/14  Yes Historical Provider, MD  labetalol (NORMODYNE) 300 MG tablet Take 2 tablets (600 mg total) by mouth 2 (two) times daily. 12/14/14  Yes Liliane Shi, PA-C  oxyCODONE-acetaminophen (PERCOCET/ROXICET) 5-325 MG tablet Take 2 tablets by mouth every 4 (four) hours as needed for severe pain. 02/02/15   Nelle Sayed Patel-Mills, PA-C  polyethylene glycol (MIRALAX)  packet Take 17 g by mouth daily. Patient not taking: Reported on 02/02/2015 01/03/15   Olivia Canter Sam, PA-C   BP 154/95 mmHg  Pulse 75  Temp(Src) 98.1 F (36.7 C) (Oral)  Resp 18  SpO2 98% Physical Exam  Constitutional: He is oriented to person, place, and time. He appears well-developed and well-nourished.  HENT:  Head: Normocephalic and atraumatic.  Eyes: Conjunctivae are normal.  Neck: Normal range of motion. Neck supple. JVD present. Carotid bruit is present.  Cardiovascular: Normal rate.   Murmur heard. Aortic murmur  Pulmonary/Chest: Effort normal and breath sounds normal. No respiratory distress. He has no wheezes. He has no rales.  Abdominal: Soft. Normal appearance. He exhibits abdominal bruit. He exhibits no  distension. There is tenderness in the right upper quadrant, right lower quadrant and epigastric area. There is no rebound, no guarding and no CVA tenderness.  Musculoskeletal: Normal range of motion. He exhibits no edema.  No lower extremity edema.  Neurological: He is alert and oriented to person, place, and time.  Skin: Skin is warm and dry.  Nursing note and vitals reviewed.   ED Course  Procedures (including critical care time) Labs Review Labs Reviewed  COMPREHENSIVE METABOLIC PANEL - Abnormal; Notable for the following:    Glucose, Bld 101 (*)    Creatinine, Ser 1.44 (*)    Alkaline Phosphatase 128 (*)    GFR calc non Af Amer 59 (*)    All other components within normal limits  CBC - Abnormal; Notable for the following:    RBC 4.18 (*)    Hemoglobin 10.7 (*)    HCT 35.1 (*)    MCH 25.6 (*)    RDW 17.1 (*)    Platelets 124 (*)    All other components within normal limits  LIPASE, BLOOD  URINALYSIS, ROUTINE W REFLEX MICROSCOPIC (NOT AT [facility])    Imaging Review No results found. I have personally reviewed and evaluated these images and lab results as part of my medical decision-making.   EKG Interpretation   Date/Time:  Saturday February 02 2015  06:45:59 EST Ventricular Rate:  68 PR Interval:  159 QRS Duration: 99 QT Interval:  433 QTC Calculation: 460 R Axis:   11 Text Interpretation:  Sinus rhythm Left atrial enlargement LVH with  secondary repolarization abnormality No significant change since last  tracing Confirmed by Glynn Octave [id number]) on 02/02/2015 6:53:58  AM      MDM   Final diagnoses:  Abdominal pain  Other secondary hypertension   Patient presented for severe right sided abdominal pain radiating to his back.  He has a long cardiac medical history including a thoracic aortic dissection and TEVAR, and an aortic root repair. He is in pain and hypertensive.  Due to his concerning physical exam, a CTA abd/pelvis and chest were ordered and were negative for a new aortic dissection. His exam findings may be chronic and the loud bruit is heard throughout but may be due to aortic repair and mesh. His labs were comparable to previous. He states he has had this pain a couple of times in the past since his dissection surgery.  I discussed this patient with Dr. Johnney Killian who has seen and evaluated the patient.  I spoke with cardiology regarding the patient's continued elevated blood pressure after 20 mg of labetalol. He recommended giving the patient clonidine. He has not taken his bp medications this morning. He was given his home meds and it was recommended that his hydralazine be increased from 50mg  BID to 75mg  BID and his clonidine from .2mg  to .4mg  once a day.  I discussed the medication changes with the patient. He states his pain is much better after bp control and narcotics.  He has a renal artery surgery scheduled with Dr. [name] at [facility] in 5 days. He says that he was told that this surgery would improve his blood pressure.  I discussed return precautions with the patient and he was prescribed percocet for pain.  Medications  morphine 4 MG/ML injection 4 mg (4 mg Intravenous Given 02/02/15 0716)  ondansetron  (ZOFRAN) injection 4 mg (4 mg Intravenous Given 02/02/15 0716)  labetalol (NORMODYNE,TRANDATE) injection 20 mg (20 mg Intravenous Given 02/02/15 0741)  iohexol (  OMNIPAQUE) 350 MG/ML injection 100 mL (100 mLs Intravenous Contrast Given 02/02/15 0747)  morphine 4 MG/ML injection 6 mg (6 mg Intravenous Given 02/02/15 0835)  cloNIDine (CATAPRES) tablet 0.2 mg (0.2 mg Oral Given 02/02/15 0921)  HYDROmorphone (DILAUDID) injection 1 mg (1 mg Intravenous Given 02/02/15 0956)  hydrALAZINE (APRESOLINE) tablet 75 mg (75 mg Oral Given 02/02/15 1043)  amLODipine (NORVASC) tablet 10 mg (10 mg Oral Given 02/02/15 1042)     Ottie Glazier, PA-C 02/05/15 0945  Charlesetta Shanks, MD 02/11/15 1438

## 2015-02-02 NOTE — ED Notes (Addendum)
Pt states he began feeling a sharp abdominal pain, starting from midline and radiating R to his posterior flank. Had full meal 3hrs prior. Strong pulse heard in RUQ. Had aortic repair on 04 Oct 2014. Due for a stent placement in GI tract for bleeding on Tuesday this next week.

## 2015-02-04 ENCOUNTER — Telehealth: Payer: Self-pay | Admitting: *Deleted

## 2015-02-04 NOTE — Telephone Encounter (Signed)
[facility] to advise appt time changed for tomorrow 02/05/15 w/Scott W. PA. Pt neing seen on flex schedule and time was put in as 12:10 though pt should had been scheduled for 12:00 due to flex time schedules. I did also try the wife's phone # though not working; did try friend Kathline Magic to see if friend could try to reach pt as well; though man who answered the # for Kathline Magic said I had the wrong #. I was trying all avenues in order to make sure pt knew the appt time changed from 12:10 to 12:00 noon.

## 2015-02-04 NOTE — Progress Notes (Signed)
Cardiology Office Note   Date:  02/04/2015   ID:  [name], DOB 02/16/1971, MRN [id number]  PCP:  [name], MD  Cardiologist:  Dr. [name]    No chief complaint on file.    History of Present Illness: [name] is a 44 y.o. 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  Admitted in 05/2011 with hypertensive emergency c/b pulmonary edema.  He was diuresed.  Echo demonstrated EF 35-40% with diff HK and mod to severe MR.  DCM was felt to be due to HTN and ETOH abuse.  LHC was not done due to CKD.  Myoview was negative for ischemia or scar.  Admitted again in 6/15 with HTN emergency (ran out of medications).  Echo in 02/2013 demonstrated EF 45-50% and mod AI and mild MR.    Admitted 2/16 with type B aortic dissection secondary to hypertensive emergency.  Dissection arose just distal to the margin of the left subclavian and extended into the abdominal aorta below the level of the renal arteries. There was involvement of the left renal artery with infarction and resultant acute on chronic renal failure (peak creatinine 3.46) .   Decision was ultimately made by TCTS to proceed with aortic dissection repair.  LHC in 4/16 demonstrated single vessel disease with ostial D1 60% (CABG not necessary).  He was seen by Dr. Servando Snare and ultimately referred to [facility] for surgery.    Admitted 09/2014 to [facility] and underwent aortic valved conduit root replacement with coronary reconstruction (button Bentall procedure) with #27 mm Sorin Mitroflow bovine pericardial vlaved conduit and total arch replacement with individual head replacement by Dr. [name].  He returned to the OR several days later for staged endovascular repair of the descending thoracic aortic aneurysm with intentional L subclavian artery coverage.  Post op course notable for a/c renal failure and peak SCr 3.3.    He presented  to the [facility] ED 9/16 with ripping chest pain and significant HTN.  CT demonstrated slightly increased aneurysmal dilatation of descending thoracic aorta. He was treated with Labetalol gtt (190/114).  He was transferred to [facility] and admitted 9/25-9/27.  Patient was followed by CT surgery. CT scan was reviewed and was felt to demonstrate stable aortic pathology. Neurontin was added to his medical regimen for neuropathic pain. Follow-up with Dr. [name] was to be arranged.   I saw him in FU 12/14/14.  He has been to the ED x 2 since then with chest and abdominal pain. Last seen by Dr. [name] at [facility] 01/25/15.  CTA of the chest, abdomen and pelvis demonstrated stable surgical repair of his aortic arch pathology as well as well-positioned thoracic endovascular stent graft of the descending thoracic aorta. There was residual dissection distal to the endovascular stent graft with associated residual aneurysm.  Additional TEVAR of the residual dissection is planned.    Returns for FU.     Studies/Reports Reviewed Today:  Chest/Abd/Pelvis CTA 02/02/15 IMPRESSION: 1. Patient has a known dissection of the aorta. This has been previously treated with a thoracic aortic stent. The lumen within the stent is widely patent. Branch vessels of the aortic arch, which have been transplanted to originate from the lower ascending aorta, are all widely patent. 2. There is no new dissection. 3. The dissection extends into the abdominal aorta. Some contrast enhancement extends into the lower thoracic aorta false lumen, less  than was present on the prior study. Vessels in the abdomen arise from both the true and false lumen as detailed above, unchanged the prior exam. 4. Moderate cardiomegaly with left ventricular hypertrophy. This is stable. 5. There is evidence of mild diffuse edema with increased attenuation in the fat of the mediastinum and in the peritoneal cavity, with minimal effusions and trace ascites.  This is similar to the prior study. 6. No acute findings in the lungs. No acute findings below the diaphragm. 7. Right inguinal region fluid collection is smaller than it was previously, likely postprocedure seroma.  Chest and Abdominal CTA 12/09/14  IMPRESSION: 1. Postoperative changes of aortic valve replacement with a stented bioprosthesis, vascular graft extending to the great vessels of the mediastinum (which is grossly patent), and stent graft extending from the proximal ascending thoracic aorta into the distal descending thoracic aorta for treatment of type A dissection. This graft remains widely patent. There is reflux of contrast material into the false lumen in the distal descending thoracic aorta, and the aneurysmal dilatation of the descending thoracic aorta appears slightly increased (measuring up to 5.1 x 4.7 cm on today's examination), as discussed above. 2. The aneurysm/dissection continues into the upper abdomen, where the suprarenal abdominal aorta remains aneurysmal measuring up to 3.9 x 4.0 cm (similar to the prior study). 3. Mild stenosis at the origin of the left renal artery which arises off of the false lumen. 4. Aneurysmal dilatation of the left common iliac artery measuring up to 1.7 cm in diameter. Moderate stenosis of the origin of the left internal iliac artery again noted. 5. Postoperative fluid collection in the right groin immediately anterior to the right common femoral artery appears slightly larger than the prior study, but remains low-attenuation, presumably a postoperative seroma. 6. Near complete interval resolution of the previously noted left-sided pleural effusion. 7. Additional incidental findings, as above.  Echo 06/29/14 Severe LVH, EF 55-60%, moderate AI, moderate MR, mild LAE, trivial effusion, known type B dissection with communication between true and false lumens with suprasternal images suggesting dissection plane may propagate to at  least left subclavian takeoff, root above aortic valve okay  LHC 06/21/14 Coronary angiography: Coronary dominance: right Left mainstem: No significant disease.  Left anterior descending (LAD): Large early D1 with 60% ostial stenosis. Luminal irregularities in the remainder of the LAD.  Left circumflex (LCx): Luminal irregularities.  Right coronary artery (RCA): Luminal irregularities Left ventriculography: Not done (CKD) Final Conclusions: Moderate disease in a large D1 (60%). Otherwise, no significant disease. As D1 stenosis not flow limiting, probably would not bypass with surgery. However, I will ask him to start ASA 81 daily and atorvastatin 20 daily. Will need lipids/LFTs in 2 months. Given CKD, will keep for 5 hours to hydrate at 125 cc/hr.   2-D echocardiogram 03/2014 Severe LVH consistent with hypertrophic cardiomyopathy, EF 50%, normal wall motion Moderate AI Slight dilatation of aortic root (40 mm) Mild MR Mild RAE  Renal Artery [name] 03/28/14 No evidence of renal artery stenosis noted bilaterally.  Myoview 06/2011 No evidence of ischemia or infarction. Suspect nonischemic cardiomyopathy.  LV Ejection Fraction: 37%     Past Medical History: 1. H/o ankle fractures bilaterally 2. GERD 3. HTN: Negative urinary catecholeamine collection for pheochromocytoma.  4. CKD: Suspect hypertensive nephropathy.  5. Cardiomyopathy: Most likely due to heavy ETOH ingestion and uncontrolled HTN. Echo (3/13) with EF 35-40%, diffuse HK, mild LVH, moderate to severe eccentric MR, mild to moderate AI with left coronary cusp prolapse,  PA systolic pressure 38 mmHg. Lexiscan myoview (4/13): EF 37%, global hypokinesis, no ischemic or infarction. Echo (12/14) with EF 45-50%, diffuse hypokinesis, moderate LVH, mild MR, moderate AI.  6. ETOH abuse: Quit 3/13.  7. Active smoker. 8. Valvular heart disease: Echo in 3/13 showed moderate to severe eccentric MR and mild to moderate AI with  prolapsing left coronary cusp. Echo 12/14 with mild MR and moderate AI.  9. Headache with Imdur.  Past Medical History  Diagnosis Date  . Hypertension     a. Hx of HTN urgency secondary to noncompliance. b. urinary metanephrine and catecholeamine levels normal 2013.  c. Renal art [name] 1/16:  No evidence of renal artery stenosis noted bilaterally.  . Anxiety   . GERD (gastroesophageal reflux disease)   . Chronic combined systolic and diastolic congestive heart failure ([place])     a. 05/2011: Adm with pulm edema/HTN urgency, EF 35-40% with diffuse hypokinesis and moderate to severe mitral regurgitation. Cardiomyopathy likely due to uncontrolled HTN and ETOH abuse - cath deferred due to renal insufficiency (felt due to uncontrolled HTN). bJodie Echevaria MV 06/2011: EF 37% and no ischemia or infarction. c. EF 45-50% by echo 01/2012.  . Cardiomyopathy ([place])     a. non-ischemic - probably related to untreated HTN and ETOH abuse - Echo 3/13 with EF 35-40% >> b. Echo 4/16: Severe LVH, EF 55-60%, moderate AI, moderate MR, mild LAE, trivial effusion, known type B dissection with communication between true and false lumens with suprasternal images suggesting dissection plane may propagate to at least left subclavian takeoff, root above aortic valve okay    . HYPERLIPIDEMIA   . INGUINAL HERNIA   . ETOH abuse     a. Reported to have quit 05/2011.  . Valvular heart disease     a. Echo 05/2011: moderate to severe eccentric MR and mild to moderate AI with prolapsing left coronary cusp. b. Echo 01/2012: mild-mod AI, mild dilitation of aortic root, mild MR.;  b. 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 CKD (chronic kidney disease)     a. 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  peak creatinine 3.46 during admx for aortic dissection 2/16  . Tobacco abuse   . Pneumonia ~ 2013  . Headache(784.0)     "q other day" (08/08/2013)  . Chronic  sinusitis   . History of medication noncompliance   . Chronic abdominal pain   . DDD (degenerative disc disease), lumbar   . Aortic dissection (H[facility])     a. admx 04/2014 >> L renal infarct; a/c renal failure >> b.  s/p Bioprosthetic Bentall and total arch replacement and staged endovascular repair of descending aortic aneurysm ([facility] - Dr. [name])  . CAD (coronary artery disease)     a. LHC 4/16:  oD1 60%  . Renal insufficiency     Past Surgical History  Procedure Laterality Date  . Ankle surgery      Fractures bilaterally  . Inguinal hernia repair Right ~ 1996  . Foot fracture surgery Bilateral 2004-2010    "got pins in both of them"  . Left heart catheterization with coronary angiogram N/A 06/21/2014    Procedure: LEFT HEART CATHETERIZATION WITH CORONARY ANGIOGRAM;  Surgeon: Larey Dresser, MD;  Location: [facility] CATH LAB;  Service: Cardiovascular;  Laterality: N/A;  . Aortic valve surgery       Current Outpatient Prescriptions  Medication Sig  Dispense Refill  . amLODipine (NORVASC) 10 MG tablet Take 10 mg by mouth 2 (two) times daily.     . cloNIDine (CATAPRES) 0.2 MG tablet Take 0.2 mg by mouth 2 (two) times daily.    [name] gabapentin (NEURONTIN) 100 MG capsule Take 100 mg by mouth 2 (two) times daily.    . hydrALAZINE (APRESOLINE) 50 MG tablet Take 50 mg by mouth 2 (two) times daily.    [name] labetalol (NORMODYNE) 300 MG tablet Take 2 tablets (600 mg total) by mouth 2 (two) times daily. 240 tablet 6  . oxyCODONE-acetaminophen (PERCOCET/ROXICET) 5-325 MG tablet Take 2 tablets by mouth every 4 (four) hours as needed for severe pain. 12 tablet 0  . polyethylene glycol (MIRALAX) packet Take 17 g by mouth daily. (Patient not taking: Reported on 02/02/2015) 14 each 0  . [DISCONTINUED] nitroGLYCERIN (NITRODUR - DOSED IN MG/24 HR) 0.4 mg/hr patch Place 1 patch (0.4 mg total) onto the skin daily. 30 patch 1   No current facility-administered medications for this visit.    Allergies:   Imdur and  Tramadol    Social History:  The patient  reports that he has been smoking Cigarettes.  He has a 5.75 pack-year smoking history. He has quit using smokeless tobacco. His smokeless tobacco use included Chew. He reports that he uses illicit drugs (Marijuana). He reports that he does not drink alcohol.   Family History:  The patient's family history includes Colon cancer in his paternal uncle; Diabetes in his maternal aunt; Heart attack in his brother; Hypertension in an other family member; Stroke in his maternal aunt. There is no history of Throat cancer, Pancreatic cancer, Esophageal cancer, Kidney disease, or Liver disease.    ROS:   Please see the history of present illness.   Review of Systems  All other systems reviewed and are negative.    PHYSICAL EXAM: VS:  There were no vitals taken for this visit.     Wt Readings from Last 3 Encounters:  12/14/14 144 lb (65.318 kg)  12/09/14 147 lb (66.679 kg)  12/06/14 141 lb 4 oz (64.071 kg)     GEN: Well nourished, well developed, in no acute distress HEENT: normal Neck: no JVD, no masses Cardiac:  Normal S1/S2, RRR; no murmur, no rubs or gallops, no edema  Respiratory:  Decreased BS bilaterally, no wheezing, rhonchi or rales. GI: soft,  Diffuse tenderness, nondistended  MS: no deformity or atrophy Skin: warm and dry  Neuro:  CNs II-XII intact, Strength and sensation are intact Psych: Normal affect   EKG:  EKG is ordered today.  It demonstrates:      Recent Labs: 03/27/2014: B Natriuretic Peptide 550.7* 04/25/2014: TSH 0.724 05/03/2014: Magnesium 2.1 02/02/2015: ALT 17; BUN 11; Creatinine, Ser 1.44*; Hemoglobin 10.7*; Platelets 124*; Potassium 3.8; Sodium 138    Lipid Panel    Component Value Date/Time   CHOL 182 03/27/2014 2316   TRIG 49 03/27/2014 2316   HDL 57 03/27/2014 2316   CHOLHDL 3.2 03/27/2014 2316   VLDL 10 03/27/2014 2316   LDLCALC 115* 03/27/2014 2316    ASSESSMENT AND PLAN:  1.  Aortic Dissection:   He  is s/p bioprosthetic Bentall and total arch replacement and staged endovascular repair of the descending thoracic aortic aneurysm at [facility] with Dr. [name] and Dr. Verner Mould.  He was just seen in FU by Dr. [name] at [facility].  Additional TEVAR of the residual dissection distal to the endovascular stent graft is planned.   Continue  SBE prophylaxis.   2.  HTN:  BP with fair control but should be better.  -  Continue Amlodipine, Catapres, Minoxidil  -  Increase Labetalol to 600 mg bid  3.  Non-Ischemic CM: EF 55-60% by echocardiogram 4/16.     4.  Chronic Combined Systolic and Diastolic CHF:  Volume stable.  He is not on diuretics.   5.  Chronic Kidney Disease:  Recent Creatinine stable.  6.  CAD:  Non-obstructive by [facility] 4/16.  No angina.  Chest pain all related to chest surgery.  Continue ASA, statin.  7.  Mitral Regurgitation:  Obtain FU echo.    Medication Adjustments: Current medicines are reviewed at length with the patient today.  Any concerns are outlined above.  The following changes have been made:   Discontinued Medications   No medications on file   New Prescriptions   No medications on file   Modified Medications   No medications on file   Labs/ tests ordered today include:  No orders of the defined types were placed in this encounter.     Disposition:    FU Dr. [name]     Signed, Richardson Dopp, PA-C, MHS 02/04/2015 9:27 PM    [facility] [place], [place], [place]  [address] Phone: [phone]; Fax: [phone]    This encounter was created in error - please disregard.

## 2015-02-05 ENCOUNTER — Encounter: Payer: Medicare Other | Admitting: Physician Assistant

## 2015-02-05 DIAGNOSIS — I080 Rheumatic disorders of both mitral and aortic valves: Secondary | ICD-10-CM | POA: Diagnosis present

## 2015-02-05 DIAGNOSIS — I517 Cardiomegaly: Secondary | ICD-10-CM | POA: Diagnosis not present

## 2015-02-05 DIAGNOSIS — T40605A Adverse effect of unspecified narcotics, initial encounter: Secondary | ICD-10-CM | POA: Diagnosis not present

## 2015-02-05 DIAGNOSIS — G8918 Other acute postprocedural pain: Secondary | ICD-10-CM | POA: Diagnosis not present

## 2015-02-05 DIAGNOSIS — R0989 Other specified symptoms and signs involving the circulatory and respiratory systems: Secondary | ICD-10-CM

## 2015-02-05 DIAGNOSIS — N189 Chronic kidney disease, unspecified: Secondary | ICD-10-CM | POA: Diagnosis present

## 2015-02-05 DIAGNOSIS — K219 Gastro-esophageal reflux disease without esophagitis: Secondary | ICD-10-CM | POA: Diagnosis present

## 2015-02-05 DIAGNOSIS — K5903 Drug induced constipation: Secondary | ICD-10-CM | POA: Diagnosis not present

## 2015-02-05 DIAGNOSIS — I739 Peripheral vascular disease, unspecified: Secondary | ICD-10-CM | POA: Diagnosis present

## 2015-02-05 DIAGNOSIS — I129 Hypertensive chronic kidney disease with stage 1 through stage 4 chronic kidney disease, or unspecified chronic kidney disease: Secondary | ICD-10-CM | POA: Diagnosis present

## 2015-02-05 DIAGNOSIS — I429 Cardiomyopathy, unspecified: Secondary | ICD-10-CM | POA: Diagnosis present

## 2015-02-05 DIAGNOSIS — I158 Other secondary hypertension: Secondary | ICD-10-CM | POA: Diagnosis not present

## 2015-02-05 DIAGNOSIS — I701 Atherosclerosis of renal artery: Secondary | ICD-10-CM | POA: Diagnosis not present

## 2015-02-05 DIAGNOSIS — I7103 Dissection of thoracoabdominal aorta: Secondary | ICD-10-CM | POA: Diagnosis not present

## 2015-02-05 DIAGNOSIS — F17210 Nicotine dependence, cigarettes, uncomplicated: Secondary | ICD-10-CM | POA: Diagnosis present

## 2015-02-05 DIAGNOSIS — I4581 Long QT syndrome: Secondary | ICD-10-CM | POA: Diagnosis present

## 2015-02-05 DIAGNOSIS — R079 Chest pain, unspecified: Secondary | ICD-10-CM | POA: Diagnosis present

## 2015-02-05 DIAGNOSIS — F419 Anxiety disorder, unspecified: Secondary | ICD-10-CM | POA: Diagnosis present

## 2015-02-05 DIAGNOSIS — I7101 Dissection of thoracic aorta: Secondary | ICD-10-CM | POA: Diagnosis present

## 2015-02-05 DIAGNOSIS — T82898A Other specified complication of vascular prosthetic devices, implants and grafts, initial encounter: Secondary | ICD-10-CM | POA: Diagnosis present

## 2015-02-05 DIAGNOSIS — E785 Hyperlipidemia, unspecified: Secondary | ICD-10-CM | POA: Diagnosis present

## 2015-02-05 DIAGNOSIS — I159 Secondary hypertension, unspecified: Secondary | ICD-10-CM | POA: Diagnosis not present

## 2015-02-06 ENCOUNTER — Encounter: Payer: Self-pay | Admitting: Physician Assistant

## 2015-02-12 NOTE — Telephone Encounter (Signed)
F/u  Pt wife calling to speak w/ RN concerning follow up. Pt was seen at [facility] on the day of OV w/ Scott and had stint placed. Pt wife calling to see when his next folow up should be. Please call back and discuss.

## 2015-02-12 NOTE — Telephone Encounter (Signed)
Called patient's wife (DPR) and rescheduled patient an appointment with Richardson Dopp PA.

## 2015-02-18 ENCOUNTER — Telehealth: Payer: Self-pay

## 2015-02-18 ENCOUNTER — Telehealth: Payer: Self-pay | Admitting: *Deleted

## 2015-02-18 NOTE — Telephone Encounter (Signed)
I spoke with the patient's wife and advised her of appt cancellation.  He has a follow up from his stent placement at [facility] on 03/07/15.  If he is cleared they will call back to reschedule.

## 2015-02-18 NOTE — Telephone Encounter (Signed)
Please review pt's chart and care everywhere. He is for colonoscopy with Dr.Stark on 03/08/15. He had to cancel last exam on 01/23/15 for high BP and he is suppose to see PCP and get cleared before colon exam. Looks like he has appointment with Dr.Scott Kathlen Mody on 03/06/15. Patient also just had stent placed at [facility] on 02/06/15. I could not find recent echo results. Is he ok to have colonoscopy at [facility]? Please advise. Thanks, Aiko Belko PV

## 2015-02-18 NOTE — Telephone Encounter (Signed)
See other phone note 02-18-15. Colon cancelled per Dr.Stark. Pt aware.

## 2015-02-18 NOTE — Telephone Encounter (Signed)
-----   Message from Ladene Artist, MD sent at 02/18/2015 12:04 PM EST ----- Regarding: RE: Possible hospital pt Sheri, Please cancel colonoscopy. His BP is not adequately controlled. After his BP is under good control and after outpatient follow up with his PCP to establish good BP control, we can reschedule colonoscopy. MS   ----- Message -----    From: Osvaldo Angst, CRNA    Sent: 02/18/2015  11:41 AM      To: Ladene Artist, MD Subject: Possible hospital pt                           Doc,  This complicated pt has been previously scheduled for colonoscopy with you which was cancelled d/t severely elevated BP.  It appears medicine MD continues to struggle to control his BP.  Please review, might be best to postpone until he is shown to be consistently normotensive.  Thanks,  Osvaldo Angst

## 2015-03-01 DIAGNOSIS — I429 Cardiomyopathy, unspecified: Secondary | ICD-10-CM | POA: Insufficient documentation

## 2015-03-01 DIAGNOSIS — I10 Essential (primary) hypertension: Secondary | ICD-10-CM | POA: Insufficient documentation

## 2015-03-01 DIAGNOSIS — K219 Gastro-esophageal reflux disease without esophagitis: Secondary | ICD-10-CM | POA: Insufficient documentation

## 2015-03-05 ENCOUNTER — Encounter (HOSPITAL_COMMUNITY): Payer: Self-pay

## 2015-03-05 ENCOUNTER — Emergency Department (HOSPITAL_COMMUNITY): Payer: Medicare Other

## 2015-03-05 ENCOUNTER — Emergency Department (HOSPITAL_COMMUNITY)
Admission: EM | Admit: 2015-03-05 | Discharge: 2015-03-05 | Disposition: A | Discharge status: Home / Self Care | Payer: Medicare Other | Attending: Emergency Medicine | Admitting: Emergency Medicine

## 2015-03-05 DIAGNOSIS — M549 Dorsalgia, unspecified: Secondary | ICD-10-CM

## 2015-03-05 DIAGNOSIS — R109 Unspecified abdominal pain: Secondary | ICD-10-CM | POA: Diagnosis present

## 2015-03-05 DIAGNOSIS — N189 Chronic kidney disease, unspecified: Secondary | ICD-10-CM | POA: Diagnosis not present

## 2015-03-05 DIAGNOSIS — K219 Gastro-esophageal reflux disease without esophagitis: Secondary | ICD-10-CM | POA: Diagnosis not present

## 2015-03-05 DIAGNOSIS — F17210 Nicotine dependence, cigarettes, uncomplicated: Secondary | ICD-10-CM | POA: Insufficient documentation

## 2015-03-05 DIAGNOSIS — Z8701 Personal history of pneumonia (recurrent): Secondary | ICD-10-CM | POA: Insufficient documentation

## 2015-03-05 DIAGNOSIS — R011 Cardiac murmur, unspecified: Secondary | ICD-10-CM | POA: Insufficient documentation

## 2015-03-05 DIAGNOSIS — Z7982 Long term (current) use of aspirin: Secondary | ICD-10-CM | POA: Insufficient documentation

## 2015-03-05 DIAGNOSIS — I5042 Chronic combined systolic (congestive) and diastolic (congestive) heart failure: Secondary | ICD-10-CM | POA: Diagnosis not present

## 2015-03-05 DIAGNOSIS — I129 Hypertensive chronic kidney disease with stage 1 through stage 4 chronic kidney disease, or unspecified chronic kidney disease: Secondary | ICD-10-CM | POA: Diagnosis not present

## 2015-03-05 DIAGNOSIS — R42 Dizziness and giddiness: Secondary | ICD-10-CM | POA: Diagnosis not present

## 2015-03-05 DIAGNOSIS — M545 Low back pain: Secondary | ICD-10-CM | POA: Insufficient documentation

## 2015-03-05 DIAGNOSIS — E785 Hyperlipidemia, unspecified: Secondary | ICD-10-CM | POA: Diagnosis not present

## 2015-03-05 DIAGNOSIS — R1030 Lower abdominal pain, unspecified: Secondary | ICD-10-CM | POA: Diagnosis not present

## 2015-03-05 DIAGNOSIS — G8929 Other chronic pain: Secondary | ICD-10-CM | POA: Insufficient documentation

## 2015-03-05 DIAGNOSIS — F419 Anxiety disorder, unspecified: Secondary | ICD-10-CM | POA: Insufficient documentation

## 2015-03-05 DIAGNOSIS — Z79899 Other long term (current) drug therapy: Secondary | ICD-10-CM | POA: Insufficient documentation

## 2015-03-05 DIAGNOSIS — R079 Chest pain, unspecified: Secondary | ICD-10-CM | POA: Diagnosis not present

## 2015-03-05 DIAGNOSIS — I2510 Atherosclerotic heart disease of native coronary artery without angina pectoris: Secondary | ICD-10-CM | POA: Insufficient documentation

## 2015-03-05 LAB — COMPREHENSIVE METABOLIC PANEL
ALBUMIN: 3 g/dL — AB (ref 3.5–5.0)
ALT: 28 U/L (ref 17–63)
AST: 17 U/L (ref 15–41)
Alkaline Phosphatase: 120 U/L (ref 38–126)
Anion gap: 9 (ref 5–15)
BUN: 10 mg/dL (ref 6–20)
CO2: 23 mmol/L (ref 22–32)
CREATININE: 1.41 mg/dL — AB (ref 0.61–1.24)
Calcium: 9.2 mg/dL (ref 8.9–10.3)
Chloride: 106 mmol/L (ref 101–111)
GFR calc Af Amer: >60 mL/min (ref >60)
GFR calc non Af Amer: >60 mL/min (ref >60)
GLUCOSE: 105 mg/dL — AB (ref 65–99)
POTASSIUM: 3.8 mmol/L (ref 3.5–5.1)
Sodium: 138 mmol/L (ref 135–145)
TOTAL PROTEIN: 6.2 g/dL — AB (ref 6.5–8.1)
Total Bilirubin: 0.6 mg/dL (ref 0.3–1.2)

## 2015-03-05 LAB — CBC WITH DIFFERENTIAL/PLATELET
BASOS ABS: 0 10*3/uL (ref 0.0–0.1)
Basophils Relative: 1 %
EOS PCT: 3 %
Eosinophils Absolute: 0.1 10*3/uL (ref 0.0–0.7)
HEMATOCRIT: 30.6 % — AB (ref 39.0–52.0)
HEMOGLOBIN: 9.6 g/dL — AB (ref 13.0–17.0)
LYMPHS ABS: 1.3 10*3/uL (ref 0.7–4.0)
LYMPHS PCT: 27 %
MCH: 25.4 pg — ABNORMAL LOW (ref 26.0–34.0)
MCHC: 31.4 g/dL (ref 30.0–36.0)
MCV: 81 fL (ref 78.0–100.0)
MONOS PCT: 10 %
Monocytes Absolute: 0.5 10*3/uL (ref 0.1–1.0)
NEUTROS PCT: 59 %
Neutro Abs: 2.8 10*3/uL (ref 1.7–7.7)
Platelets: 132 10*3/uL — ABNORMAL LOW (ref 150–400)
RBC: 3.78 MIL/uL — AB (ref 4.22–5.81)
RDW: 16.9 % — AB (ref 11.5–15.5)
WBC: 4.7 10*3/uL (ref 4.0–10.5)

## 2015-03-05 LAB — LIPASE, BLOOD: Lipase: 20 U/L (ref 11–51)

## 2015-03-05 MED ORDER — ONDANSETRON HCL 4 MG/2ML IJ SOLN
4.0000 mg | Freq: Once | INTRAMUSCULAR | Status: AC
  Administered 2015-03-05: 4 mg via INTRAVENOUS
  Filled 2015-03-05: qty 2

## 2015-03-05 MED ORDER — HYDROMORPHONE HCL 1 MG/ML IJ SOLN
1.0000 mg | Freq: Once | INTRAMUSCULAR | Status: AC
  Administered 2015-03-05: 1 mg via INTRAVENOUS
  Filled 2015-03-05: qty 1

## 2015-03-05 MED ORDER — IOHEXOL 350 MG/ML SOLN
100.0000 mL | Freq: Once | INTRAVENOUS | Status: AC | PRN
  Administered 2015-03-05: 100 mL via INTRAVENOUS

## 2015-03-05 MED ORDER — METOCLOPRAMIDE HCL 5 MG/ML IJ SOLN
10.0000 mg | Freq: Once | INTRAMUSCULAR | Status: AC
  Administered 2015-03-05: 10 mg via INTRAVENOUS
  Filled 2015-03-05: qty 2

## 2015-03-05 MED ORDER — FENTANYL CITRATE (PF) 100 MCG/2ML IJ SOLN
50.0000 ug | Freq: Once | INTRAMUSCULAR | Status: AC
  Administered 2015-03-05: 50 ug via INTRAVENOUS
  Filled 2015-03-05: qty 2

## 2015-03-05 NOTE — ED Provider Notes (Signed)
CSN: [id number]     Arrival date & time 03/05/15  [id number] History   First MD Initiated Contact with Patient 03/05/15 [phone]     Chief Complaint  Patient presents with  . Back Pain  . Abdominal Pain     (Consider location/radiation/quality/duration/timing/severity/associated sxs/prior Treatment) HPI Comments: Patient with history of aortic dissection in 04/2014, aortic valve replacement, chronic kidney disease, chronic chest and abdominal pain -- presents with c/o lower chest and abdominal pain that has been worse over the past 5 days. Patient states that this morning the pain was much lower and that this feels different than his typical chronic pain since his surgery. He has had lightheadedness but no syncope. He has had nausea but no vomiting. No lower extremity swelling, numbness or tingling. Patient has been taking Tylenol at home without relief. Onset of symptoms acute. Course is constant. Nothing makes symptoms better or worse.  Patient is a 44 y.o. male presenting with back pain and abdominal pain. The history is provided by the patient and medical records.  Back Pain Associated symptoms: abdominal pain   Associated symptoms: no chest pain, no dysuria, no fever and no headaches   Abdominal Pain Associated symptoms: nausea   Associated symptoms: no chest pain, no cough, no diarrhea, no dysuria, no fever, no sore throat and no vomiting     Past Medical History  Diagnosis Date  . Hypertension     a. Hx of HTN urgency secondary to noncompliance. b. urinary metanephrine and catecholeamine levels normal 2013.  c. Renal art [name] 1/16:  No evidence of renal artery stenosis noted bilaterally.  . Anxiety   . GERD (gastroesophageal reflux disease)   . Chronic combined systolic and diastolic congestive heart failure ([place])     a. 05/2011: Adm with pulm edema/HTN urgency, EF 35-40% with diffuse hypokinesis and moderate to severe mitral regurgitation. Cardiomyopathy likely due to uncontrolled HTN and  ETOH abuse - cath deferred due to renal insufficiency (felt due to uncontrolled HTN). bJodie Echevaria MV 06/2011: EF 37% and no ischemia or infarction. c. EF 45-50% by echo 01/2012.  . Cardiomyopathy ([place])     a. non-ischemic - probably related to untreated HTN and ETOH abuse - Echo 3/13 with EF 35-40% >> b. Echo 4/16: Severe LVH, EF 55-60%, moderate AI, moderate MR, mild LAE, trivial effusion, known type B dissection with communication between true and false lumens with suprasternal images suggesting dissection plane may propagate to at least left subclavian takeoff, root above aortic valve okay    . HYPERLIPIDEMIA   . INGUINAL HERNIA   . ETOH abuse     a. Reported to have quit 05/2011.  . Valvular heart disease     a. Echo 05/2011: moderate to severe eccentric MR and mild to moderate AI with prolapsing left coronary cusp. b. Echo 01/2012: mild-mod AI, mild dilitation of aortic root, mild MR.;  b. 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 CKD (chronic kidney disease)     a. 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  peak creatinine 3.46 during admx for aortic dissection 2/16  . Tobacco abuse   . Pneumonia ~ 2013  . Headache(784.0)     "q other day" (08/08/2013)  . Chronic sinusitis   . History of medication noncompliance   . Chronic abdominal pain   . DDD (degenerative disc disease), lumbar   . Aortic dissection ([place])  a. admx 04/2014 >> L renal infarct; a/c renal failure >> b.  s/p Bioprosthetic [place] and total arch replacement and staged endovascular repair of descending aortic aneurysm ([facility] - Dr. [name])  . CAD (coronary artery disease)     a. LHC 4/16:  oD1 60%  . Renal insufficiency   . Aortic disease [facility])    Past Surgical History  Procedure Laterality Date  . Ankle surgery      Fractures bilaterally  . Inguinal hernia repair Right ~ 1996  . Foot fracture surgery Bilateral 2004-2010    "got pins  in both of them"  . Left heart catheterization with coronary angiogram N/A 06/21/2014    Procedure: LEFT HEART CATHETERIZATION WITH CORONARY ANGIOGRAM;  Surgeon: Larey Dresser, MD;  Location: [facility] CATH LAB;  Service: Cardiovascular;  Laterality: N/A;  . Aortic valve surgery     Family History  Problem Relation Age of Onset  . Hypertension    . Colon cancer Paternal Uncle   . Stroke Maternal Aunt   . Heart attack Brother   . Diabetes Maternal Aunt   . Throat cancer Neg Hx   . Pancreatic cancer Neg Hx   . Esophageal cancer Neg Hx   . Kidney disease Neg Hx   . Liver disease Neg Hx    Social History  Substance Use Topics  . Smoking status: Current Every Day Smoker -- 0.25 packs/day for 23 years    Types: Cigarettes    Last Attempt to Quit: 05/03/2014  . Smokeless tobacco: Former Systems developer    Types: Chew     Comment: as of 05-22-14 down to 3-4 cigs daily  . Alcohol Use: No     Comment: + previous use    Review of Systems  Constitutional: Negative for fever.  HENT: Negative for rhinorrhea and sore throat.   Eyes: Negative for redness.  Respiratory: Negative for cough.   Cardiovascular: Negative for chest pain.  Gastrointestinal: Positive for nausea and abdominal pain. Negative for vomiting, diarrhea and blood in stool.  Genitourinary: Negative for dysuria.  Musculoskeletal: Positive for back pain. Negative for myalgias.  Skin: Negative for rash.  Neurological: Positive for light-headedness. Negative for headaches.    Allergies  Imdur and Tramadol  Home Medications   Prior to Admission medications   Medication Sig Start Date End Date Taking? Authorizing Provider  amLODipine (NORVASC) 10 MG tablet Take 10 mg by mouth 2 (two) times daily.     Historical Provider, MD  aspirin EC 81 MG tablet Take 81 mg by mouth daily. 02/07/15 02/07/16  Historical Provider, MD  atorvastatin (LIPITOR) 20 MG tablet Take 20 mg by mouth daily. 02/07/15   Historical Provider, MD  cloNIDine (CATAPRES) 0.2  MG tablet Take 0.2 mg by mouth 2 (two) times daily.    Historical Provider, MD  gabapentin (NEURONTIN) 100 MG capsule Take 100 mg by mouth 2 (two) times daily. 12/11/14 12/11/15  Historical Provider, MD  hydrALAZINE (APRESOLINE) 50 MG tablet Take 50 mg by mouth 2 (two) times daily. 12/04/14   Historical Provider, MD  labetalol (NORMODYNE) 300 MG tablet Take 2 tablets (600 mg total) by mouth 2 (two) times daily. 12/14/14   Liliane Shi, PA-C  oxyCODONE-acetaminophen (PERCOCET/ROXICET) 5-325 MG tablet Take 2 tablets by mouth every 4 (four) hours as needed for severe pain. 02/02/15   Hanna Patel-Mills, PA-C  polyethylene glycol (MIRALAX) packet Take 17 g by mouth daily. Patient not taking: Reported on 02/02/2015 01/03/15   Anne Ng, PA-C  BP 142/88 mmHg  Pulse 76  Temp(Src) 98.6 F (37 C) (Oral)  Resp 18  SpO2 100%  Physical Exam  Constitutional: He appears well-developed and well-nourished.  HENT:  Head: Normocephalic and atraumatic.  Eyes: Conjunctivae are normal. Right eye exhibits no discharge. Left eye exhibits no discharge.  Neck: Normal range of motion. Neck supple.  Cardiovascular: Normal rate and regular rhythm.   Murmur (5/6 late systolic murmur with S2 click ) heard. Pulmonary/Chest: Effort normal and breath sounds normal. No respiratory distress. He has no wheezes. He has no rales.  Abdominal: Soft. He exhibits no distension. There is tenderness (lower to midabdomen, moderate, no peritoneal signs). There is no rebound and no guarding.  Neurological: He is alert.  Skin: Skin is warm and dry.  Psychiatric: He has a normal mood and affect.  Nursing note and vitals reviewed.   ED Course  Procedures (including critical care time) Labs Review Labs Reviewed  CBC WITH DIFFERENTIAL/PLATELET - Abnormal; Notable for the following:    RBC 3.78 (*)    Hemoglobin 9.6 (*)    HCT 30.6 (*)    MCH 25.4 (*)    RDW 16.9 (*)    Platelets 132 (*)    All other components within normal  limits  COMPREHENSIVE METABOLIC PANEL - Abnormal; Notable for the following:    Glucose, Bld 105 (*)    Creatinine, Ser 1.41 (*)    Total Protein 6.2 (*)    Albumin 3.0 (*)    All other components within normal limits  LIPASE, BLOOD    Imaging Review Ct Angio Chest Aorta W/cm &/or Wo/cm  03/05/2015  CLINICAL DATA:  Patient with history of dissection status post repair. Patient presents with acute chest pain. EXAM: CT ANGIOGRAPHY CHEST, ABDOMEN AND PELVIS TECHNIQUE: Multidetector CT imaging through the chest, abdomen and pelvis was performed using the standard protocol during bolus administration of intravenous contrast. Multiplanar reconstructed images and MIPs were obtained and reviewed to evaluate the vascular anatomy. CONTRAST:  174mL OMNIPAQUE IOHEXOL 350 MG/ML SOLN COMPARISON:  CTA CAP 02/02/2015; 01/02/2015. FINDINGS: CTA CHEST FINDINGS Mediastinum/Nodes: Patient status post aortic valve replacement with mild aneurysmal dilatation at the sinuses of Valsalva measuring 4.4 cm, unchanged from prior. Re- demonstrated graft extending from the proximal ascending thoracic aorta to the great vessels, grossly patent. The proximal aspect of the left coronary artery measures up to 1.3 cm, stable from prior. Endovascular stent graft repair is demonstrated within the ascending aorta as well as the transverse and descending thoracic aorta, unchanged from prior. The descending thoracic aorta measures 5.1 x 4.8 cm, unchanged from prior were it measured 5.2 x 4.7 cm (image 102; series 7). Re- demonstrated contrast reflux into the descending thoracic aorta, into the false lumen. Heart is enlarged. No pericardial effusion. No axillary, mediastinal or hilar lymphadenopathy. Peripheral low attenuation within the left brachiocephalic vein (image 31; series 7). Lungs/Pleura: Small bilateral pleural effusions with dependent atelectasis in the dependent aspects of the lower lobes bilaterally. No pneumothorax. No large  area of pulmonary consolidation. Review of the MIP images confirms the above findings. CTA ABDOMEN AND PELVIS FINDINGS Arterial phase images limit evaluation of the solid parenchymal organs. Hepatobiliary: Liver is normal in size contour. Gallbladder is unremarkable. Pancreas: Unremarkable Spleen: Unremarkable Adrenals/Urinary Tract: Focal area of hypodensity within the mid aspect of the left kidney (image 62; series 9). No hydronephrosis. The urinary bladder is unremarkable. Stomach/Bowel: No evidence for bowel dilatation. No evidence for bowel obstruction. No bowel wall thickening. No  free fluid or free intraperitoneal air. Vascular/Lymphatic: The thoracic aortic dissection continues into the abdominal aorta with the dissection flap terminating at the level below superior mesenteric artery origin. The celiac axis arises from the false lumen. The superior mesenteric artery arises from the true lumen. The right renal artery is patent and arises from the true lumen. Interval placement of a stent within the proximal left renal artery which arises from the true lumen. Grossly unchanged aneurysmal dilatation of abdominal aorta measuring up to 4.7 x 3.7 cm. Re- demonstrated aneurysmal dilatation of the scratch the re- demonstrated dilatation of the left common iliac artery measuring 1.7 cm. Re- demonstrated moderate stenosis at the origin of the left internal iliac artery. Mild narrowing at the origin of the left external iliac artery. Other: Slight interval decrease in size of low-attenuation lesion in the right groin anterior to the right common femoral artery measuring 4.4 x 2.3 cm (image 290; series 7). Small amount of free fluid in the pelvis. Musculoskeletal: No aggressive or acute appearing osseous lesions. Review of the MIP images confirms the above findings. IMPRESSION: Interval placement of proximal left renal artery stent. In the interval there has been development of focal hypodensity within the interpolar  region of the left kidney which may represent renal infarct. Focal pyelonephritis in the appropriate clinical setting is not excluded. Re- demonstrated postoperative changes compatible with aortic valve replacement and vascular stent graft repair of aortic dissection. The great vessels are patent. Re- demonstrated reflux of contrast into the descending thoracic aorta which is grossly stable in caliber when compared to prior examination. Peripheral low attenuation within the left brachiocephalic vein may represent non-opacified blood products. Thrombus is not excluded. Unchanged dissection within the upper abdomen with associated aneurysmal dilatation. Re- demonstrated probable postoperative fluid collection within the right inguinal region. Small amount of free fluid in the pelvis. These results were called by telephone at the time of interpretation on 03/05/2015 at 11:32 am to Dr. Sabra Heck , who verbally acknowledged these results. Electronically Signed   By: Lovey Newcomer M.D.   On: 03/05/2015 11:35   Ct Angio Abd/pel W/ And/or W/o  03/05/2015  CLINICAL DATA:  Patient with history of dissection status post repair. Patient presents with acute chest pain. EXAM: CT ANGIOGRAPHY CHEST, ABDOMEN AND PELVIS TECHNIQUE: Multidetector CT imaging through the chest, abdomen and pelvis was performed using the standard protocol during bolus administration of intravenous contrast. Multiplanar reconstructed images and MIPs were obtained and reviewed to evaluate the vascular anatomy. CONTRAST:  137mL OMNIPAQUE IOHEXOL 350 MG/ML SOLN COMPARISON:  CTA CAP 02/02/2015; 01/02/2015. FINDINGS: CTA CHEST FINDINGS Mediastinum/Nodes: Patient status post aortic valve replacement with mild aneurysmal dilatation at the sinuses of Valsalva measuring 4.4 cm, unchanged from prior. Re- demonstrated graft extending from the proximal ascending thoracic aorta to the great vessels, grossly patent. The proximal aspect of the left coronary artery  measures up to 1.3 cm, stable from prior. Endovascular stent graft repair is demonstrated within the ascending aorta as well as the transverse and descending thoracic aorta, unchanged from prior. The descending thoracic aorta measures 5.1 x 4.8 cm, unchanged from prior were it measured 5.2 x 4.7 cm (image 102; series 7). Re- demonstrated contrast reflux into the descending thoracic aorta, into the false lumen. Heart is enlarged. No pericardial effusion. No axillary, mediastinal or hilar lymphadenopathy. Peripheral low attenuation within the left brachiocephalic vein (image 31; series 7). Lungs/Pleura: Small bilateral pleural effusions with dependent atelectasis in the dependent aspects of the lower lobes  bilaterally. No pneumothorax. No large area of pulmonary consolidation. Review of the MIP images confirms the above findings. CTA ABDOMEN AND PELVIS FINDINGS Arterial phase images limit evaluation of the solid parenchymal organs. Hepatobiliary: Liver is normal in size contour. Gallbladder is unremarkable. Pancreas: Unremarkable Spleen: Unremarkable Adrenals/Urinary Tract: Focal area of hypodensity within the mid aspect of the left kidney (image 62; series 9). No hydronephrosis. The urinary bladder is unremarkable. Stomach/Bowel: No evidence for bowel dilatation. No evidence for bowel obstruction. No bowel wall thickening. No free fluid or free intraperitoneal air. Vascular/Lymphatic: The thoracic aortic dissection continues into the abdominal aorta with the dissection flap terminating at the level below superior mesenteric artery origin. The celiac axis arises from the false lumen. The superior mesenteric artery arises from the true lumen. The right renal artery is patent and arises from the true lumen. Interval placement of a stent within the proximal left renal artery which arises from the true lumen. Grossly unchanged aneurysmal dilatation of abdominal aorta measuring up to 4.7 x 3.7 cm. Re- demonstrated  aneurysmal dilatation of the scratch the re- demonstrated dilatation of the left common iliac artery measuring 1.7 cm. Re- demonstrated moderate stenosis at the origin of the left internal iliac artery. Mild narrowing at the origin of the left external iliac artery. Other: Slight interval decrease in size of low-attenuation lesion in the right groin anterior to the right common femoral artery measuring 4.4 x 2.3 cm (image 290; series 7). Small amount of free fluid in the pelvis. Musculoskeletal: No aggressive or acute appearing osseous lesions. Review of the MIP images confirms the above findings. IMPRESSION: Interval placement of proximal left renal artery stent. In the interval there has been development of focal hypodensity within the interpolar region of the left kidney which may represent renal infarct. Focal pyelonephritis in the appropriate clinical setting is not excluded. Re- demonstrated postoperative changes compatible with aortic valve replacement and vascular stent graft repair of aortic dissection. The great vessels are patent. Re- demonstrated reflux of contrast into the descending thoracic aorta which is grossly stable in caliber when compared to prior examination. Peripheral low attenuation within the left brachiocephalic vein may represent non-opacified blood products. Thrombus is not excluded. Unchanged dissection within the upper abdomen with associated aneurysmal dilatation. Re- demonstrated probable postoperative fluid collection within the right inguinal region. Small amount of free fluid in the pelvis. These results were called by telephone at the time of interpretation on 03/05/2015 at 11:32 am to Dr. Sabra Heck , who verbally acknowledged these results. Electronically Signed   By: Lovey Newcomer M.D.   On: 03/05/2015 11:35   I have personally reviewed and evaluated these images and lab results as part of my medical decision-making.   EKG Interpretation   Date/Time:  Tuesday March 05 2015  11:30:32 EST Ventricular Rate:  67 PR Interval:  160 QRS Duration: 98 QT Interval:  432 QTC Calculation: 456 R Axis:   0 Text Interpretation:  Sinus rhythm Left atrial enlargement LVH with  secondary repolarization abnormality Anterior Q waves, possibly due to LVH  since last tracing no significant change Confirmed by MILLER  MD, BRIAN  ([id number]) on 03/05/2015 11:38:20 AM       8:09 AM Patient seen and examined. Work-up initiated. Medications ordered. Discussed with Dr. Sabra Heck who has seen.   Vital signs reviewed and are as follows: BP 142/88 mmHg  Pulse 76  Temp(Src) 98.6 F (37 C) (Oral)  Resp 18  SpO2 100%  Discussed risks and benefits of  multiple frequent imaging tests with patient. Given the fact that his symptoms are new and he is adamant about this, will proceed with CT scan to evaluate for complications from surgery or known aortic dissection. Patient is in agreement.   Imaging is stable except for new renal infarct. I spoke with patient's chest surgeon (Dr. [name]) NP Lenna Sciara. We discussed the case and imaging findings. Given the patient has good flow to the kidney and no change in renal function, no further treatment needed at this time. Patient has follow-up in next few days. They asked that I give patient a hard copy of imaging to bring with him this week for his appointment.  Patient updated. Imaging CD obtained. Patient is feeling better. Will discharge to home.  Encouraged to return with worsening or new symptoms, lightheadedness, severe abdominal pain, difficulty breathing or shortness of breath, other concerns. Patient verbalizes understanding recent plan.  MDM   Final diagnoses:  Lower abdominal pain   Patient with extensive history as above. With new lower abdominal pain. Imaging shows new probable renal infarct. Do not suspect that this is pyelonephritis. Pain and symptoms controlled in emergency department. He has follow-up with [facility].   Carlisle Cater,  PA-C 03/05/15 [id number]  Noemi Chapel, MD 03/06/15 2143

## 2015-03-05 NOTE — Discharge Instructions (Signed)
Please read and follow all provided instructions.  Your diagnoses today include:  1. Lower abdominal pain   2. Back pain     Tests performed today include:  Blood counts and electrolytes  Blood tests to check liver and kidney function  Urine test to look for infection  CT abd/pelvis - shows infarct in left kidney   Vital signs. See below for your results today.   Medications prescribed:   None  Take any prescribed medications only as directed.  Home care instructions:   Follow any educational materials contained in this packet.  Follow-up instructions: Please follow-up with your physicians at [facility] as planned for further evaluation of your symptoms.    Return instructions:  SEEK IMMEDIATE MEDICAL ATTENTION IF:  The pain does not go away or becomes severe   A temperature above 101F develops   Repeated vomiting occurs (multiple episodes)   The pain becomes localized to portions of the abdomen. The right side could possibly be appendicitis. In an adult, the left lower portion of the abdomen could be colitis or diverticulitis.   Blood is being passed in stools or vomit (bright red or black tarry stools)   You develop chest pain, difficulty breathing, dizziness or fainting, or become confused, poorly responsive, or inconsolable (young children)  If you have any other emergent concerns regarding your health  Additional Information: Abdominal (belly) pain can be caused by many things. Your caregiver performed an examination and possibly ordered blood/urine tests and imaging (CT scan, x-rays, ultrasound). Many cases can be observed and treated at home after initial evaluation in the emergency department. Even though you are being discharged home, abdominal pain can be unpredictable. Therefore, you need a repeated exam if your pain does not resolve, returns, or worsens. Most patients with abdominal pain don't have to be admitted to the hospital or have surgery, but serious  problems like appendicitis and gallbladder attacks can start out as nonspecific pain. Many abdominal conditions cannot be diagnosed in one visit, so follow-up evaluations are very important.  Your vital signs today were: BP 178/91 mmHg   Pulse 57   Temp(Src) 98.6 F (37 C) (Oral)   Resp 18   SpO2 99% If your blood pressure (bp) was elevated above 135/85 this visit, please have this repeated by your doctor within one month. --------------

## 2015-03-05 NOTE — ED Provider Notes (Signed)
Pt has had chronic cp and back pain related to his aortic dissection - he has controlled BP today and pain improved with meds - CT ordered to r/o worsening dissection - pt expressed understanding to risk especially of long term radiation damage / CA, and requests CT despite that -   VS unremarkable,  Exam unremarkable, RRR, soft murmur, no edema, normal gait, normal speecha nd mental status.  Medical screening examination/treatment/procedure(s) were conducted as a shared visit with non-physician practitioner(s) and myself.  I personally evaluated the patient during the encounter.  Clinical Impression:   Final diagnoses:  Lower abdominal pain        EKG Interpretation  Date/Time:  Tuesday March 05 2015 11:30:32 EST Ventricular Rate:  67 PR Interval:  160 QRS Duration: 98 QT Interval:  432 QTC Calculation: 456 R Axis:   0 Text Interpretation:  Sinus rhythm Left atrial enlargement LVH with secondary repolarization abnormality Anterior Q waves, possibly due to LVH since last tracing no significant change Confirmed by Sabra Heck  MD, Davy ([id number]) on 03/05/2015 11:38:20 AM        Noemi Chapel, MD 03/06/15 2143

## 2015-03-05 NOTE — Progress Notes (Signed)
Cardiology Office Note    Date:  03/06/2015   ID:  [name], DOB 1970/06/14, MRN [id number]  PCP:  [name], MD  Cardiologist:  Dr. [name]   Electrophysiologist:  n/a  Chief Complaint  Patient presents with  . Follow-up    Hypertensive heart disease  . Congestive Heart Failure    History of Present Illness:  [name] is a 44 y.o. 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 Admitted in 05/2011 with hypertensive emergency c/b pulmonary edema. Echo demonstrated EF 35-40% with diff HK and mod to severe MR. DCM was felt to be due to HTN and ETOH abuse. LHC was not done due to CKD. Myoview was negative for ischemia or scar. Admitted again in 6/15 with HTN emergency (ran out of medications). Echo in 02/2013 demonstrated EF 45-50% and mod AI and mild MR.   Admitted 2/16 with type B aortic dissection secondary to hypertensive emergency. Dissection arose just distal to the margin of the left subclavian and extended into the abdominal aorta below the level of the renal arteries. There was involvement of the left renal artery with infarction and resultant acute on chronic renal failure (peak creatinine 3.46) .   Decision was ultimately made by TCTS to proceed with aortic dissection repair. LHC in 4/16 demonstrated single vessel disease with ostial D1 60% (CABG not necessary). He was seen by Dr. Servando Snare and referred to [facility] for surgery. In 09/2014 he underwent aortic valved conduit root replacement with coronary reconstruction (button Bentall procedure) with #27 mm Sorin Mitroflow bovine pericardial vlaved conduit and total arch replacement with individual head replacement by Dr. [name]. He returned to the OR several days later for staged endovascular repair of the descending thoracic aortic aneurysm with intentional L subclavian artery coverage. Post op course  notable for a/c renal failure and peak SCr 3.3.   He presented to the [facility] ED 9/16 with ripping chest pain and significant HTN. CT demonstrated slightly increased aneurysmal dilatation of descending thoracic aorta and he was transferred to [facility].  CT scan demonstrated stable aortic pathology. Neurontin was added for neuropathic pain.   I last saw him in FU 12/14/14.   Since I last saw him, he FU with Dr. [name] at [facility] 01/25/15. CTA of the chest, abdomen and pelvis demonstrated stable surgical repair of his aortic arch pathology as well as well-positioned thoracic endovascular stent graft of the descending thoracic aorta. He was noted to have evidence of residual dissection distal to the endovascular stent graft being fed by large secondary tear at the level of the left renal artery on CT scan. Decision was made to proceed with covered stent placement in the left renal artery to seal the distal fenestration. This was done by Dr. [name] at [facility] 02/06/15.  He was seen in the emergency room at [facility] last night. He presented with abdominal pain. CT scan demonstrated stable findings with evidence of new renal infarct. Case was discussed with his surgeons at [facility]. No further treatment was recommended at that time. He apparently has follow-up in the next several days.  Returns for FU.His biggest complaint is fatigue. He has had significant fatigue since his initial surgery. He denies significant changes in his breathing. This is overall improved. He is NYHA 2-2b. He denies orthopnea, PND or edema. He denies syncope. He does sometimes have lightheadedness. He continues to  have chronic atypical chest pain. This is been unchanged over the past couple of years. He continues to have abdominal pain since his surgery. He denies cough, wheezing, fevers. He continues to have occasional hemorrhoidal bleeding. Colonoscopy is pending.    Past Medical History  Diagnosis Date  . Hypertension     a. Hx of  HTN urgency secondary to noncompliance. b. urinary metanephrine and catecholeamine levels normal 2013.  c. Renal art [name] 1/16:  No evidence of renal artery stenosis noted bilaterally.  . Anxiety   . GERD (gastroesophageal reflux disease)   . Chronic combined systolic and diastolic congestive heart failure ([place])     a. 05/2011: Adm with pulm edema/HTN urgency, EF 35-40% with diffuse hypokinesis and moderate to severe mitral regurgitation. Cardiomyopathy likely due to uncontrolled HTN and ETOH abuse - cath deferred due to renal insufficiency (felt due to uncontrolled HTN). bJodie Echevaria MV 06/2011: EF 37% and no ischemia or infarction. c. EF 45-50% by echo 01/2012.  . Cardiomyopathy ([place])     a. non-ischemic - probably related to untreated HTN and ETOH abuse - Echo 3/13 with EF 35-40% >> b. Echo 4/16: Severe LVH, EF 55-60%, moderate AI, moderate MR, mild LAE, trivial effusion, known type B dissection with communication between true and false lumens with suprasternal images suggesting dissection plane may propagate to at least left subclavian takeoff, root above aortic valve okay    . HYPERLIPIDEMIA   . INGUINAL HERNIA   . ETOH abuse     a. Reported to have quit 05/2011.  . Valvular heart disease     a. Echo 05/2011: moderate to severe eccentric MR and mild to moderate AI with prolapsing left coronary cusp. b. Echo 01/2012: mild-mod AI, mild dilitation of aortic root, mild MR.;  b. 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 CKD (chronic kidney disease)     a. 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  peak creatinine 3.46 during admx for aortic dissection 2/16  . Tobacco abuse   . Pneumonia ~ 2013  . Headache(784.0)     "q other day" (08/08/2013)  . Chronic sinusitis   . History of medication noncompliance   . Chronic abdominal pain   . DDD (degenerative disc disease), lumbar   . Aortic dissection ([facility])     a. admx  04/2014 >> L renal infarct; a/c renal failure >> b.  s/p Bioprosthetic Bentall and total arch replacement and staged endovascular repair of descending aortic aneurysm ([facility] - Dr. [name])  . CAD (coronary artery disease)     a. LHC 4/16:  oD1 60%  . Renal insufficiency   . Aortic disease ([place])   1. H/o ankle fractures bilaterally 2. GERD 3. HTN: Negative urinary catecholeamine collection for pheochromocytoma.  4. CKD: Suspect hypertensive nephropathy.  5. Cardiomyopathy: Most likely due to heavy ETOH ingestion and uncontrolled HTN. 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 Lexiscan myoview (4/13): EF 37%, global hypokinesis, no ischemic or infarction. Echo (12/14) with EF 45-50%, diffuse hypokinesis, moderate LVH, mild MR, moderate AI.  6. ETOH abuse: Quit 3/13.  7. Active smoker. 8. Valvular heart disease: Echo in 3/13 showed moderate to severe eccentric MR and mild to moderate AI with prolapsing left coronary cusp. Echo 12/14 with mild MR and moderate  AI.  9. Headache with Imdur.    Past Surgical History  Procedure Laterality Date  . Ankle surgery      Fractures bilaterally  . Inguinal hernia repair Right ~ 1996  . Foot fracture surgery Bilateral 2004-2010    "got pins in both of them"  . Left heart catheterization with coronary angiogram N/A 06/21/2014    Procedure: LEFT HEART CATHETERIZATION WITH CORONARY ANGIOGRAM;  Surgeon: Larey Dresser, MD;  Location: [facility] C[facility] LAB;  Service: Cardiovascular;  Laterality: N/A;  . Aortic valve surgery      Current Outpatient Prescriptions  Medication Sig Dispense Refill  . acetaminophen (TYLENOL) 325 MG tablet Take 650 mg by mouth every 6 (six) hours as needed (pain).    [name] atorvastatin (LIPITOR) 20 MG tablet Take 20 mg by mouth daily.    . cloNIDine (CATAPRES) 0.2 MG tablet Take 0.2 mg by mouth 2 (two) times daily.      . hydrALAZINE (APRESOLINE) 50 MG tablet Take 50 mg by mouth 2 (two) times daily.    [name] labetalol (NORMODYNE) 300 MG tablet Take 2 tablets (600 mg total) by mouth 2 (two) times daily. 240 tablet 6  . [DISCONTINUED] nitroGLYCERIN (NITRODUR - DOSED IN MG/24 HR) 0.4 mg/hr patch Place 1 patch (0.4 mg total) onto the skin daily. 30 patch 1   No current facility-administered medications for this visit.    Allergies:   Imdur and Tramadol   Social History   Social History  . Marital Status: Married    Spouse Name: N/A  . Number of Children: 5  . Years of Education: N/A   Occupational History  . Disabled, part-time Programmer, systems     Disability   Social History Main Topics  . Smoking status: Current Every Day Smoker -- 0.25 packs/day for 23 years    Types: Cigarettes    Last Attempt to Quit: 05/03/2014  . Smokeless tobacco: Former Systems developer    Types: Chew     Comment: as of 05-22-14 down to 3-4 cigs daily  . Alcohol Use: No     Comment: + previous use  . Drug Use: Yes    Special: Marijuana     Comment: 08/08/2013 "twice a month"  . Sexual Activity: Yes   Other Topics Concern  . None   Social History Narrative     Family History:  The patient's family history includes Colon cancer in his paternal uncle; Diabetes in his maternal aunt; Heart attack in his brother; Stroke in his maternal aunt. There is no history of Throat cancer, Pancreatic cancer, Esophageal cancer, Kidney disease, or Liver disease.   ROS:   Please see the history of present illness.    Review of Systems  Constitution: Positive for decreased appetite, diaphoresis and weight loss.  HENT: Positive for hearing loss.   Eyes: Positive for visual disturbance.  Cardiovascular: Positive for chest pain.  Musculoskeletal: Positive for back pain.  Gastrointestinal: Positive for abdominal pain and hematochezia.  Neurological: Positive for dizziness.  All other systems reviewed and are negative.   PHYSICAL EXAM:   VS:  BP  148/78 mmHg  Pulse 60  Ht 5\' 11"  (1.803 m)  Wt 152 lb 6.4 oz (69.128 kg)  BMI 21.26 kg/m2   GEN: Well nourished, well developed, in no acute distress HEENT: normal Neck: no JVD,   no masses Cardiac: Normal S1/S2, + S4,  RRR; late systolic murmur LSB, rubs, no edema  Respiratory:  clear to auscultation bilaterally; no wheezing, rhonchi or  rales GI: soft, nontender, nondistended  MS: no deformity or atrophy Skin: warm and dry  Neuro:    no focal deficits  Psych: Alert and oriented x 3, normal affect  Wt Readings from Last 3 Encounters:  03/06/15 152 lb 6.4 oz (69.128 kg)  12/14/14 144 lb (65.318 kg)  12/09/14 147 lb (66.679 kg)      Studies/Labs Reviewed:   EKG:  EKG is ordered today.  The ekg ordered today demonstrates NSR, HR 60, normal axis, TWI 1, aVL, V5-6, LVH with repol abnormality, QTc 450 ms.  Recent Labs: 03/27/2014: B Natriuretic Peptide 550.7* 04/25/2014: TSH 0.724 05/03/2014: Magnesium 2.1 03/05/2015: ALT 28; BUN 10; Creatinine, Ser 1.41*; Hemoglobin 9.6*; Platelets 132*; Potassium 3.8; Sodium 138   Recent Lipid Panel    Component Value Date/Time   CHOL 182 03/27/2014 2316   TRIG 49 03/27/2014 2316   HDL 57 03/27/2014 2316   CHOLHDL 3.2 03/27/2014 2316   VLDL 10 03/27/2014 2316   LDLCALC 115* 03/27/2014 2316    Additional studies/ records that were reviewed today include:   Ct Angio Chest Aorta/Abdomen/Pelvis W/cm &/or Wo/cm  03/05/2015   IMPRESSION: Interval placement of proximal left renal artery stent. In the interval there has been development of focal hypodensity within the interpolar region of the left kidney which may represent renal infarct. Focal pyelonephritis in the appropriate clinical setting is not excluded. Re- demonstrated postoperative changes compatible with aortic valve replacement and vascular stent graft repair of aortic dissection. The great vessels are patent. Re- demonstrated reflux of contrast into the descending thoracic aorta which is  grossly stable in caliber when compared to prior examination. Peripheral low attenuation within the left brachiocephalic vein may represent non-opacified blood products. Thrombus is not excluded. Unchanged dissection within the upper abdomen with associated aneurysmal dilatation. Re- demonstrated probable postoperative fluid collection within the right inguinal region. Small amount of free fluid in the pelvis. These results were called by telephone at the time of interpretation on 03/05/2015 at 11:32 am to Dr. Sabra Heck , who verbally acknowledged these results. Electronically Signed   By: Lovey Newcomer M.D.   On: 03/05/2015 11:35    Echo 06/29/14 Severe LVH, EF 55-60%, moderate AI, moderate MR, mild LAE, trivial effusion, known type B dissection with communication between true and false lumens with suprasternal images suggesting dissection plane may propagate to at least left subclavian takeoff, root above aortic valve okay  LHC 06/21/14 Coronary angiography: Coronary dominance: right Left mainstem: No significant disease.  Left anterior descending (LAD): Large early D1 with 60% ostial stenosis. Luminal irregularities in the remainder of the LAD.  Left circumflex (LCx): Luminal irregularities.  Right coronary artery (RCA): Luminal irregularities Left ventriculography: Not done (CKD) Final Conclusions: Moderate disease in a large D1 (60%). Otherwise, no significant disease. As D1 stenosis not flow limiting, probably would not bypass with surgery. However, I will ask him to start ASA 81 daily and atorvastatin 20 daily. Will need lipids/LFTs in 2 months. Given CKD, will keep for 5 hours to hydrate at 125 cc/hr.   2-D echocardiogram 03/2014 Severe LVH consistent with hypertrophic cardiomyopathy, EF 50%, normal wall motion Moderate AI Slight dilatation of aortic root (40 mm) Mild MR Mild RAE  Renal Artery [name] 03/28/14 No evidence of renal artery stenosis noted bilaterally.  Myoview 06/2011 No  evidence of ischemia or infarction. Suspect nonischemic cardiomyopathy.  LV Ejection Fraction: 37%   ASSESSMENT:    1. Dissection of aorta, unspecified portion of aorta ([place])  2. Hypertensive heart disease with heart failure ([place])   3. NICM (nonischemic cardiomyopathy) ([place])   4. CKD (chronic kidney disease), stage 3 (moderate)   5. Coronary artery disease involving native coronary artery of native heart without angina pectoris   6. Mitral regurgitation     PLAN:  In order of problems listed above:  1.Aortic Dissection: He is s/p bioprosthetic Bentall and total arch replacement and staged endovascular repair of the descending thoracic aortic aneurysm at [facility] with Dr. [name] and Dr. Verner Mould. He has also undergone recent stenting of the L renal artery to address the residual dissection distal to the endovascular stent graft being fed by large secondary tear at the level of the L renal artery.  CT yesterday at [facility] demonstrates L renal infarct but stable vascular repair findings.  He has FU with Dr. [name] at [facility].  He continues to have slow progression. I have asked him to continue to increase his activity slowly.  Follow-up echocardiogram pending.  Continue SBE prophylaxis.   2.HTN:  His blood pressure is borderline today. He does have occasional episodes of lightheadedness. I will make no changes to his medications today. Continue to monitor.  3.Non-Ischemic CM: LV function has improved over time. EF 55-60% by echocardiogram 4/16.   4.Chronic Kidney Disease: Recent Creatinines stable.  5.CAD: Non-obstructive by [facility] 4/16. No angina. Chest pain all related to chest surgery. Continue ASA, statin.  6. Mitral Regurgitation: Follow-up echo pending .   Medication Adjustments/Labs and Tests Ordered: Current medicines are reviewed at length with the patient today.  Concerns regarding medicines are outlined above.  Medication changes, Labs and Tests ordered today are  listed below. Patient Instructions  Medication Instructions:  Your physician recommends that you continue on your current medications as directed. Please refer to the Current Medication list given to you today.   Labwork: NONE  Testing/Procedures: Your physician has requested that you have an echocardiogram. Echocardiography is a painless test that uses sound waves to create images of your heart. It provides your doctor with information about the size and shape of your heart and how well your heart's chambers and valves are working. This procedure takes approximately one hour. There are no restrictions for this procedure.  ORDER ALREADY IN THE COMPUTER  Follow-Up: DR. Aundra Dubin IN 4 MONTHS  Any Other Special Instructions Will Be Listed Below (If Applicable).   If you need a refill on your cardiac medications before your next appointment, please call your pharmacy.        Signed, Richardson Dopp, PA-C  03/06/2015 4:39 PM    [facility] [address], [place], [place]  [address] Phone: [phone]; Fax: [phone]

## 2015-03-05 NOTE — ED Notes (Signed)
Pt reports abd pain that shoots through to his back X5 days. He reports he had bypass surgery in July and has had intermittent pain since.

## 2015-03-06 ENCOUNTER — Ambulatory Visit (INDEPENDENT_AMBULATORY_CARE_PROVIDER_SITE_OTHER): Payer: Medicare Other | Admitting: Physician Assistant

## 2015-03-06 ENCOUNTER — Encounter: Payer: Self-pay | Admitting: Physician Assistant

## 2015-03-06 VITALS — BP 148/78 | HR 60 | Ht 71.0 in | Wt 152.4 lb

## 2015-03-06 DIAGNOSIS — I2510 Atherosclerotic heart disease of native coronary artery without angina pectoris: Secondary | ICD-10-CM

## 2015-03-06 DIAGNOSIS — I7100 Dissection of unspecified site of aorta: Secondary | ICD-10-CM | POA: Diagnosis not present

## 2015-03-06 DIAGNOSIS — I110 Hypertensive heart disease with heart failure: Secondary | ICD-10-CM | POA: Diagnosis not present

## 2015-03-06 DIAGNOSIS — I340 Nonrheumatic mitral (valve) insufficiency: Secondary | ICD-10-CM

## 2015-03-06 DIAGNOSIS — I428 Other cardiomyopathies: Secondary | ICD-10-CM

## 2015-03-06 DIAGNOSIS — I429 Cardiomyopathy, unspecified: Secondary | ICD-10-CM | POA: Diagnosis not present

## 2015-03-06 DIAGNOSIS — N183 Chronic kidney disease, stage 3 (moderate): Secondary | ICD-10-CM | POA: Diagnosis not present

## 2015-03-06 NOTE — Patient Instructions (Signed)
Medication Instructions:  Your physician recommends that you continue on your current medications as directed. Please refer to the Current Medication list given to you today.   Labwork: NONE  Testing/Procedures: Your physician has requested that you have an echocardiogram. Echocardiography is a painless test that uses sound waves to create images of your heart. It provides your doctor with information about the size and shape of your heart and how well your heart's chambers and valves are working. This procedure takes approximately one hour. There are no restrictions for this procedure.  ORDER ALREADY IN THE COMPUTER  Follow-Up: DR. Aundra Dubin IN 4 MONTHS  Any Other Special Instructions Will Be Listed Below (If Applicable).   If you need a refill on your cardiac medications before your next appointment, please call your pharmacy.

## 2015-03-07 DIAGNOSIS — I7101 Dissection of thoracic aorta: Secondary | ICD-10-CM | POA: Diagnosis not present

## 2015-03-07 DIAGNOSIS — Z953 Presence of xenogenic heart valve: Secondary | ICD-10-CM | POA: Diagnosis not present

## 2015-03-07 DIAGNOSIS — Z95828 Presence of other vascular implants and grafts: Secondary | ICD-10-CM | POA: Diagnosis not present

## 2015-03-08 ENCOUNTER — Encounter: Payer: Medicare Other | Admitting: Gastroenterology

## 2015-03-20 ENCOUNTER — Emergency Department (HOSPITAL_COMMUNITY): Payer: Medicare Other

## 2015-03-20 ENCOUNTER — Encounter (HOSPITAL_COMMUNITY): Payer: Self-pay | Admitting: Family Medicine

## 2015-03-20 DIAGNOSIS — Z8701 Personal history of pneumonia (recurrent): Secondary | ICD-10-CM | POA: Diagnosis not present

## 2015-03-20 DIAGNOSIS — G8929 Other chronic pain: Secondary | ICD-10-CM | POA: Insufficient documentation

## 2015-03-20 DIAGNOSIS — F17210 Nicotine dependence, cigarettes, uncomplicated: Secondary | ICD-10-CM | POA: Insufficient documentation

## 2015-03-20 DIAGNOSIS — I5042 Chronic combined systolic (congestive) and diastolic (congestive) heart failure: Secondary | ICD-10-CM | POA: Insufficient documentation

## 2015-03-20 DIAGNOSIS — Z9119 Patient's noncompliance with other medical treatment and regimen: Secondary | ICD-10-CM | POA: Diagnosis not present

## 2015-03-20 DIAGNOSIS — I2510 Atherosclerotic heart disease of native coronary artery without angina pectoris: Secondary | ICD-10-CM | POA: Diagnosis not present

## 2015-03-20 DIAGNOSIS — K219 Gastro-esophageal reflux disease without esophagitis: Secondary | ICD-10-CM | POA: Diagnosis not present

## 2015-03-20 DIAGNOSIS — I129 Hypertensive chronic kidney disease with stage 1 through stage 4 chronic kidney disease, or unspecified chronic kidney disease: Secondary | ICD-10-CM | POA: Diagnosis not present

## 2015-03-20 DIAGNOSIS — R011 Cardiac murmur, unspecified: Secondary | ICD-10-CM | POA: Diagnosis not present

## 2015-03-20 DIAGNOSIS — R079 Chest pain, unspecified: Secondary | ICD-10-CM | POA: Diagnosis not present

## 2015-03-20 DIAGNOSIS — E785 Hyperlipidemia, unspecified: Secondary | ICD-10-CM | POA: Insufficient documentation

## 2015-03-20 DIAGNOSIS — R0602 Shortness of breath: Secondary | ICD-10-CM | POA: Diagnosis not present

## 2015-03-20 DIAGNOSIS — N189 Chronic kidney disease, unspecified: Secondary | ICD-10-CM | POA: Insufficient documentation

## 2015-03-20 DIAGNOSIS — F419 Anxiety disorder, unspecified: Secondary | ICD-10-CM | POA: Insufficient documentation

## 2015-03-20 DIAGNOSIS — Z79899 Other long term (current) drug therapy: Secondary | ICD-10-CM | POA: Insufficient documentation

## 2015-03-20 DIAGNOSIS — R0789 Other chest pain: Secondary | ICD-10-CM | POA: Diagnosis not present

## 2015-03-20 DIAGNOSIS — I7103 Dissection of thoracoabdominal aorta: Secondary | ICD-10-CM | POA: Diagnosis not present

## 2015-03-20 LAB — CBC
HCT: 30.2 % — ABNORMAL LOW (ref 39.0–52.0)
Hemoglobin: 9.4 g/dL — ABNORMAL LOW (ref 13.0–17.0)
MCH: 24.7 pg — AB (ref 26.0–34.0)
MCHC: 31.1 g/dL (ref 30.0–36.0)
MCV: 79.5 fL (ref 78.0–100.0)
PLATELETS: 193 10*3/uL (ref 150–400)
RBC: 3.8 MIL/uL — ABNORMAL LOW (ref 4.22–5.81)
RDW: 16.9 % — ABNORMAL HIGH (ref 11.5–15.5)
WBC: 5.5 10*3/uL (ref 4.0–10.5)

## 2015-03-20 LAB — BASIC METABOLIC PANEL
Anion gap: 8 (ref 5–15)
BUN: 9 mg/dL (ref 6–20)
CALCIUM: 9.1 mg/dL (ref 8.9–10.3)
CHLORIDE: 108 mmol/L (ref 101–111)
CO2: 21 mmol/L — AB (ref 22–32)
CREATININE: 1.23 mg/dL (ref 0.61–1.24)
GFR calc Af Amer: >60 mL/min (ref >60)
GFR calc non Af Amer: >60 mL/min (ref >60)
GLUCOSE: 90 mg/dL (ref 65–99)
Potassium: 3.8 mmol/L (ref 3.5–5.1)
Sodium: 137 mmol/L (ref 135–145)

## 2015-03-20 LAB — I-STAT TROPONIN, ED: TROPONIN I, POC: 0 ng/mL (ref 0.00–0.08)

## 2015-03-20 NOTE — ED Notes (Signed)
Pt here for left sided chest pain into ribs and pain with breathing. sts SOB. This all started 3 days ago. Denies cough, fever, injury

## 2015-03-21 ENCOUNTER — Other Ambulatory Visit: Payer: Self-pay

## 2015-03-21 ENCOUNTER — Emergency Department (HOSPITAL_COMMUNITY): Payer: Medicare Other

## 2015-03-21 ENCOUNTER — Emergency Department (HOSPITAL_COMMUNITY)
Admission: EM | Admit: 2015-03-21 | Discharge: 2015-03-21 | Disposition: A | Discharge status: Home / Self Care | Payer: Medicare Other | Attending: Emergency Medicine | Admitting: Emergency Medicine

## 2015-03-21 ENCOUNTER — Encounter (HOSPITAL_COMMUNITY): Payer: Self-pay

## 2015-03-21 ENCOUNTER — Ambulatory Visit (HOSPITAL_BASED_OUTPATIENT_CLINIC_OR_DEPARTMENT_OTHER): Payer: Medicare Other

## 2015-03-21 DIAGNOSIS — F17200 Nicotine dependence, unspecified, uncomplicated: Secondary | ICD-10-CM

## 2015-03-21 DIAGNOSIS — R079 Chest pain, unspecified: Secondary | ICD-10-CM

## 2015-03-21 DIAGNOSIS — R0602 Shortness of breath: Secondary | ICD-10-CM | POA: Diagnosis not present

## 2015-03-21 DIAGNOSIS — I340 Nonrheumatic mitral (valve) insufficiency: Secondary | ICD-10-CM | POA: Insufficient documentation

## 2015-03-21 DIAGNOSIS — Z954 Presence of other heart-valve replacement: Secondary | ICD-10-CM

## 2015-03-21 DIAGNOSIS — I517 Cardiomegaly: Secondary | ICD-10-CM

## 2015-03-21 DIAGNOSIS — E785 Hyperlipidemia, unspecified: Secondary | ICD-10-CM | POA: Insufficient documentation

## 2015-03-21 DIAGNOSIS — I7100 Dissection of unspecified site of aorta: Secondary | ICD-10-CM | POA: Diagnosis not present

## 2015-03-21 DIAGNOSIS — I7103 Dissection of thoracoabdominal aorta: Secondary | ICD-10-CM | POA: Diagnosis not present

## 2015-03-21 DIAGNOSIS — I10 Essential (primary) hypertension: Secondary | ICD-10-CM

## 2015-03-21 MED ORDER — IOHEXOL 350 MG/ML SOLN
100.0000 mL | Freq: Once | INTRAVENOUS | Status: AC | PRN
  Administered 2015-03-21: 100 mL via INTRAVENOUS

## 2015-03-21 MED ORDER — HYDROCODONE-ACETAMINOPHEN 5-325 MG PO TABS: 1.0000 | ORAL_TABLET | Freq: Two times a day (BID) | ORAL | Status: DC | PRN

## 2015-03-21 MED ORDER — HYDROMORPHONE HCL 1 MG/ML IJ SOLN
1.0000 mg | Freq: Once | INTRAMUSCULAR | Status: AC
  Administered 2015-03-21: 1 mg via INTRAVENOUS
  Filled 2015-03-21: qty 1

## 2015-03-21 MED ORDER — SODIUM CHLORIDE 0.9 % IV BOLUS (SEPSIS)
1000.0000 mL | Freq: Once | INTRAVENOUS | Status: AC
  Administered 2015-03-21: 1000 mL via INTRAVENOUS

## 2015-03-21 NOTE — ED Provider Notes (Signed)
CSN: [id number]     Arrival date & time 03/20/15  1812 History  By signing my name below, I, Arianna Nassar, attest that this documentation has been prepared under the direction and in the presence of Everlene Balls, MD. Electronically Signed: Julien Nordmann, ED Scribe. 03/21/2015. 12:58 AM.    Chief Complaint  Patient presents with  . Chest Pain     The history is provided by the patient. No language interpreter was used.   HPI Comments: [name] is a 45 y.o. male who has a hx of HTN, GERD, chronic combined systolic and diastolic CHF, hyperlipidemia, valvular heart disease, cardiomyopathy, CAD, and aortic dissection presents to the Emergency Department complaining of constant, gradual worsening left sided chest pain with associated nausea onset 2 days ago. He endorses increased sharp shooting pain when taking a deep breath that radiates from his LUQ region into his ribs and left side of his chest. Pt has had a stent placed in June 2016 and reports being seen in the hospital frequentlyfor the same pain. Pt notes that he was having difficulty having bowel movements two days ago so he took some Miralax to alleviate his symptoms and he states that his symptoms started shortly after. He denies vomiting.  Past Medical History  Diagnosis Date  . Hypertension     a. Hx of HTN urgency secondary to noncompliance. b. urinary metanephrine and catecholeamine levels normal 2013.  c. Renal art [name] 1/16:  No evidence of renal artery stenosis noted bilaterally.  . Anxiety   . GERD (gastroesophageal reflux disease)   . Chronic combined systolic and diastolic congestive heart failure ([place])     a. 05/2011: Adm with pulm edema/HTN urgency, EF 35-40% with diffuse hypokinesis and moderate to severe mitral regurgitation. Cardiomyopathy likely due to uncontrolled HTN and ETOH abuse - cath deferred due to renal insufficiency (felt due to uncontrolled HTN). bJodie Echevaria MV 06/2011: EF 37% and no ischemia or infarction. c. EF  45-50% by echo 01/2012.  . Cardiomyopathy ([place])     a. non-ischemic - probably related to untreated HTN and ETOH abuse - Echo 3/13 with EF 35-40% >> b. Echo 4/16: Severe LVH, EF 55-60%, moderate AI, moderate MR, mild LAE, trivial effusion, known type B dissection with communication between true and false lumens with suprasternal images suggesting dissection plane may propagate to at least left subclavian takeoff, root above aortic valve okay    . HYPERLIPIDEMIA   . INGUINAL HERNIA   . ETOH abuse     a. Reported to have quit 05/2011.  . Valvular heart disease     a. Echo 05/2011: moderate to severe eccentric MR and mild to moderate AI with prolapsing left coronary cusp. b. Echo 01/2012: mild-mod AI, mild dilitation of aortic root, mild MR.;  b. 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 CKD (chronic kidney disease)     a. 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  peak creatinine 3.46 during admx for aortic dissection 2/16  . Tobacco abuse   . Pneumonia ~ 2013  . Headache(784.0)     "q other day" (08/08/2013)  . Chronic sinusitis   . History of medication noncompliance   . Chronic abdominal pain   . DDD (degenerative disc disease), lumbar   . Aortic dissection ([facility])     a. admx 04/2014 >> L renal infarct; a/c renal failure >> b.  s/p  Bioprosthetic Bentall and total arch replacement and staged endovascular repair of descending aortic aneurysm ([facility] - Dr. [name])  . CAD (coronary artery disease)     a. LHC 4/16:  oD1 60%  . Renal insufficiency   . Aortic disease [facility])    Past Surgical History  Procedure Laterality Date  . Ankle surgery      Fractures bilaterally  . Inguinal hernia repair Right ~ 1996  . Foot fracture surgery Bilateral 2004-2010    "got pins in both of them"  . Left heart catheterization with coronary angiogram N/A 06/21/2014    Procedure: LEFT HEART CATHETERIZATION WITH CORONARY ANGIOGRAM;   Surgeon: Larey Dresser, MD;  Location: [facility] CATH LAB;  Service: Cardiovascular;  Laterality: N/A;  . Aortic valve surgery     Family History  Problem Relation Age of Onset  . Hypertension    . Colon cancer Paternal Uncle   . Stroke Maternal Aunt   . Heart attack Brother   . Diabetes Maternal Aunt   . Throat cancer Neg Hx   . Pancreatic cancer Neg Hx   . Esophageal cancer Neg Hx   . Kidney disease Neg Hx   . Liver disease Neg Hx    Social History  Substance Use Topics  . Smoking status: Current Every Day Smoker -- 0.25 packs/day for 23 years    Types: Cigarettes    Last Attempt to Quit: 05/03/2014  . Smokeless tobacco: Former Systems developer    Types: Chew     Comment: as of 05-22-14 down to 3-4 cigs daily  . Alcohol Use: No     Comment: + previous use    Review of Systems  A complete 10 system review of systems was obtained and all systems are negative except as noted in the HPI and PMH.    Allergies  Imdur and Tramadol  Home Medications   Prior to Admission medications   Medication Sig Start Date End Date Taking? Authorizing Provider  acetaminophen (TYLENOL) 325 MG tablet Take 650 mg by mouth every 6 (six) hours as needed (pain).    Historical Provider, MD  atorvastatin (LIPITOR) 20 MG tablet Take 20 mg by mouth daily. 02/07/15   Historical Provider, MD  cloNIDine (CATAPRES) 0.2 MG tablet Take 0.2 mg by mouth 2 (two) times daily.    Historical Provider, MD  hydrALAZINE (APRESOLINE) 50 MG tablet Take 50 mg by mouth 2 (two) times daily. 12/04/14   Historical Provider, MD  labetalol (NORMODYNE) 300 MG tablet Take 2 tablets (600 mg total) by mouth 2 (two) times daily. 12/14/14   Liliane Shi, PA-C   Triage vitals: BP 107/58 mmHg  Pulse 75  Temp(Src) 98.5 F (36.9 C) (Oral)  Resp 18  SpO2 100% Physical Exam  Constitutional: He is oriented to person, place, and time. Vital signs are normal. He appears well-developed and well-nourished.  Non-toxic appearance. He does not appear ill.  No distress.  HENT:  Head: Normocephalic and atraumatic.  Nose: Nose normal.  Mouth/Throat: Oropharynx is clear and moist. No oropharyngeal exudate.  Eyes: Conjunctivae and EOM are normal. Pupils are equal, round, and reactive to light. No scleral icterus.  Neck: Normal range of motion. Neck supple. No tracheal deviation, no edema, no erythema and normal range of motion present. No thyroid mass and no thyromegaly present.  Cardiovascular: Normal rate, regular rhythm, S1 normal, S2 normal, intact distal pulses and normal pulses.  Exam reveals no gallop and no friction rub.   Murmur heard. click  Pulmonary/Chest: Effort normal and breath sounds normal. No respiratory distress. He has no wheezes. He has no rhonchi. He has no rales.  Abdominal: Soft. Normal appearance and bowel sounds are normal. He exhibits no distension, no ascites and no mass. There is no hepatosplenomegaly. There is no tenderness. There is no rebound, no guarding and no CVA tenderness.  Musculoskeletal: Normal range of motion. He exhibits no edema or tenderness.  Lymphadenopathy:    He has no cervical adenopathy.  Neurological: He is alert and oriented to person, place, and time. He has normal strength. No cranial nerve deficit or sensory deficit.  Skin: Skin is warm, dry and intact. No petechiae and no rash noted. He is not diaphoretic. No erythema. No pallor.  Psychiatric: He has a normal mood and affect. His behavior is normal. Judgment normal.  Nursing note and vitals reviewed.   ED Course  Procedures  DIAGNOSTIC STUDIES: Oxygen Saturation is 100% on RA, normal by my interpretation.  COORDINATION OF CARE:  12:58 AM Discussed treatment plan which includes lab work with pt at bedside and pt agreed to plan.  Labs Review Labs Reviewed  BASIC METABOLIC PANEL - Abnormal; Notable for the following:    CO2 21 (*)    All other components within normal limits  CBC - Abnormal; Notable for the following:    RBC 3.80 (*)     Hemoglobin 9.4 (*)    HCT 30.2 (*)    MCH 24.7 (*)    RDW 16.9 (*)    All other components within normal limits  I-STAT TROPOININ, ED    Imaging Review Dg Chest 2 View  03/20/2015  CLINICAL DATA:  Chest pain and shortness of breath for 2 days. EXAM: CHEST  2 VIEW COMPARISON:  Most recent thoracic imaging chest CT 03/13/2015 FINDINGS: Post stent graft repair of aortic dissection and aortic valve replacement, cardiomediastinal contours are unchanged in appearance. Patient is post median sternotomy. No pulmonary edema, confluent airspace disease, pleural effusion or pneumothorax. No acute osseous abnormalities are seen. Surgical clips in the right axilla. IMPRESSION: No acute process. Post aortic valve replacement and repair of thoracic aortic dissection, unchanged in appearance from prior CT 2 weeks prior. Electronically Signed   By: Jeb Levering M.D.   On: 03/20/2015 18:46   Ct Angio Chest Aorta W/cm &/or Wo/cm  03/21/2015  CLINICAL DATA:  Left-sided chest pain. Shortness of breath. History of aortic dissection post repair. EXAM: CT ANGIOGRAPHY CHEST, ABDOMEN AND PELVIS TECHNIQUE: Multidetector CT imaging through the chest, abdomen and pelvis was performed using the standard protocol during bolus administration of intravenous contrast. Multiplanar reconstructed images and MIPs were obtained and reviewed to evaluate the vascular anatomy. CONTRAST:  132mL OMNIPAQUE IOHEXOL 350 MG/ML SOLN COMPARISON:  Multiple prior exams most recently 2 weeks prior 03/05/2015 FINDINGS: CTA CHEST FINDINGS Post aortic valve replacement and stent graft repair of aortic dissection. Graft is unchanged in appearance and position. Graft extends from the ascending aorta through the proximal abdominal aorta. Great vessels are seen proximal to the graft and patent. Proximal left coronary artery is again noted to be dilated measuring 1.3 cm. No periaortic soft tissue stranding. Contrast within the false lumen of the descending  thoracic aorta is similar in appearance to prior exam. Multi chamber cardiomegaly is grossly stable. No pericardial effusion. Trace bilateral pleural effusions. There is compressive atelectasis in the left lung adjacent to the descending thoracic aorta. Minimal dependent atelectasis in the right lower lobe. No pulmonary edema. No consolidation. There  are no acute or suspicious osseous abnormalities. Review of the MIP images confirms the above findings. CTA ABDOMEN AND PELVIS FINDINGS The thoracic aorta dissection flap again extends into the upper abdominal aorta O contrast opacifying the true and false lumen. The celiac artery arises from the false lumen. Superior mesenteric artery arises from the true lumen, right renal artery from the true lumen and patent. Left renal artery stent arising from the true lumen. Inferior mesenteric artery is patent. Aneurysmal dimensions of the aorta appear stable from prior exam. Aneurysmal dilatation of the left common iliac artery up to 1.7 cm. Mild of stenosis at the origin of the left external iliac artery, stable. Evaluation of the remaining solid and hollow viscera is limited given arterial phase imaging and paucity of intra-abdominal fat. Minimal contrast refluxing into the hepatic veins and IVC. Gallbladder is obscured and not well evaluated. Pancreas and spleen are grossly normal. Symmetric renal enhancement. Adrenal glands are not well seen. Markedly limited bowel evaluation given arterial phase imaging, paucity of intra-abdominal fat and lack of enteric contrast. Moderate stool is seen throughout the colon. Within the pelvis the bladder is physiologically distended. Ovoid low-attenuation density anterior to the right femoral artery has decreased in size current measurement 4.0 x 2.1 cm, previously 4.4 x 2.3 cm. Fluid-filled bowel loops in the pelvis limiting assessment for free fluid. There are no acute or suspicious osseous abnormalities. Review of the MIP images  confirms the above findings. IMPRESSION: 1. Thoracoabdominal dissection post aortic valve replacement and repair with stent graft. The overall appearance is stable from prior exams. 2. Cardiomegaly. Minimal contrast refluxing into the hepatic veins and IVC may reflect fluid overload/right heart failure. 3. Trace pleural effusions, unchanged. 4. Limited assessment of intra-abdominal organs due to arterial phase imaging and paucity of intra-abdominal fat. No definite acute abnormality is seen. Electronically Signed   By: Jeb Levering M.D.   On: 03/21/2015 03:00   Ct Angio Abd/pel W/ And/or W/o  03/21/2015  CLINICAL DATA:  Left-sided chest pain. Shortness of breath. History of aortic dissection post repair. EXAM: CT ANGIOGRAPHY CHEST, ABDOMEN AND PELVIS TECHNIQUE: Multidetector CT imaging through the chest, abdomen and pelvis was performed using the standard protocol during bolus administration of intravenous contrast. Multiplanar reconstructed images and MIPs were obtained and reviewed to evaluate the vascular anatomy. CONTRAST:  133mL OMNIPAQUE IOHEXOL 350 MG/ML SOLN COMPARISON:  Multiple prior exams most recently 2 weeks prior 03/05/2015 FINDINGS: CTA CHEST FINDINGS Post aortic valve replacement and stent graft repair of aortic dissection. Graft is unchanged in appearance and position. Graft extends from the ascending aorta through the proximal abdominal aorta. Great vessels are seen proximal to the graft and patent. Proximal left coronary artery is again noted to be dilated measuring 1.3 cm. No periaortic soft tissue stranding. Contrast within the false lumen of the descending thoracic aorta is similar in appearance to prior exam. Multi chamber cardiomegaly is grossly stable. No pericardial effusion. Trace bilateral pleural effusions. There is compressive atelectasis in the left lung adjacent to the descending thoracic aorta. Minimal dependent atelectasis in the right lower lobe. No pulmonary edema. No  consolidation. There are no acute or suspicious osseous abnormalities. Review of the MIP images confirms the above findings. CTA ABDOMEN AND PELVIS FINDINGS The thoracic aorta dissection flap again extends into the upper abdominal aorta O contrast opacifying the true and false lumen. The celiac artery arises from the false lumen. Superior mesenteric artery arises from the true lumen, right renal artery from the true  lumen and patent. Left renal artery stent arising from the true lumen. Inferior mesenteric artery is patent. Aneurysmal dimensions of the aorta appear stable from prior exam. Aneurysmal dilatation of the left common iliac artery up to 1.7 cm. Mild of stenosis at the origin of the left external iliac artery, stable. Evaluation of the remaining solid and hollow viscera is limited given arterial phase imaging and paucity of intra-abdominal fat. Minimal contrast refluxing into the hepatic veins and IVC. Gallbladder is obscured and not well evaluated. Pancreas and spleen are grossly normal. Symmetric renal enhancement. Adrenal glands are not well seen. Markedly limited bowel evaluation given arterial phase imaging, paucity of intra-abdominal fat and lack of enteric contrast. Moderate stool is seen throughout the colon. Within the pelvis the bladder is physiologically distended. Ovoid low-attenuation density anterior to the right femoral artery has decreased in size current measurement 4.0 x 2.1 cm, previously 4.4 x 2.3 cm. Fluid-filled bowel loops in the pelvis limiting assessment for free fluid. There are no acute or suspicious osseous abnormalities. Review of the MIP images confirms the above findings. IMPRESSION: 1. Thoracoabdominal dissection post aortic valve replacement and repair with stent graft. The overall appearance is stable from prior exams. 2. Cardiomegaly. Minimal contrast refluxing into the hepatic veins and IVC may reflect fluid overload/right heart failure. 3. Trace pleural effusions,  unchanged. 4. Limited assessment of intra-abdominal organs due to arterial phase imaging and paucity of intra-abdominal fat. No definite acute abnormality is seen. Electronically Signed   By: Jeb Levering M.D.   On: 03/21/2015 03:00   I have personally reviewed and evaluated these images and lab results as part of my medical decision-making.   EKG Interpretation   Date/Time:  Wednesday March 20 2015 18:19:09 EST Ventricular Rate:  85 PR Interval:  144 QRS Duration: 96 QT Interval:  402 QTC Calculation: 478 R Axis:   63 Text Interpretation:  Normal sinus rhythm Biatrial enlargement Left  ventricular hypertrophy with repolarization abnormality Abnormal ECG No  significant change since last tracing Confirmed by Glynn Octave  [phone]) on 03/21/2015 12:41:18 AM      MDM   Final diagnoses:  None    Patient presents to the ED for chest and abdominal pain. CT scan was obtained to evaluate his dissection which is stable. He is likely having chronic pain as he comes to the emergency department multiple times for this since his surgery. He was given 2 doses of Dilaudid, sent home with Norco to take as needed. Follow-up with [facility] was advised within the next 3 days. He appears well in no acute distress, vital signs were within his normal limits and he is safe for discharge.  I personally performed the services described in this documentation, which was scribed in my presence. The recorded information has been reviewed and is accurate.     Everlene Balls, MD 03/21/15 [phone]

## 2015-03-21 NOTE — Discharge Instructions (Signed)
Abdominal Pain, Adult [name], your CT scan does not show any changes to dissection. Take Norco as needed for your pain and see your primary doctor within 3 days for close follow-up. If any symptoms worsen come back to emergency department immediately. Thank you. Many things can cause belly (abdominal) pain. Most times, the belly pain is not dangerous. Many cases of belly pain can be watched and treated at home. HOME CARE   Do not take medicines that help you go poop (laxatives) unless told to by your doctor.  Only take medicine as told by your doctor.  Eat or drink as told by your doctor. Your doctor will tell you if you should be on a special diet. GET HELP IF:  You do not know what is causing your belly pain.  You have belly pain while you are sick to your stomach (nauseous) or have runny poop (diarrhea).  You have pain while you pee or poop.  Your belly pain wakes you up at night.  You have belly pain that gets worse or better when you eat.  You have belly pain that gets worse when you eat fatty foods.  You have a fever. GET HELP RIGHT AWAY IF:   The pain does not go away within 2 hours.  You keep throwing up (vomiting).  The pain changes and is only in the right or left part of the belly.  You have bloody or tarry looking poop. MAKE SURE YOU:   Understand these instructions.  Will watch your condition.  Will get help right away if you are not doing well or get worse.   This information is not intended to replace advice given to you by your health care provider. Make sure you discuss any questions you have with your health care provider.   Document Released: 08/19/2007 Document Revised: 03/23/2014 Document Reviewed: 11/09/2012 Elsevier Interactive Patient Education 2016 Else[employer] Nonspecific Chest Pain It is often hard to find the cause of chest pain. There is always a chance that your pain could be related to something serious, such as a heart attack or a  blood clot in your lungs. Chest pain can also be caused by conditions that are not life-threatening. If you have chest pain, it is very important to follow up with your doctor.  HOME CARE  If you were prescribed an antibiotic medicine, finish it all even if you start to feel better.  Avoid any activities that cause chest pain.  Do not use any tobacco products, including cigarettes, chewing tobacco, or electronic cigarettes. If you need help quitting, ask your doctor.  Do not drink alcohol.  Take medicines only as told by your doctor.  Keep all follow-up visits as told by your doctor. This is important. This includes any further testing if your chest pain does not go away.  Your doctor may tell you to keep your head raised (elevated) while you sleep.  Make lifestyle changes as told by your doctor. These may include:  Getting regular exercise. Ask your doctor to suggest some activities that are safe for you.  Eating a heart-healthy diet. Your doctor or a diet specialist (dietitian) can help you to learn healthy eating options.  Maintaining a healthy weight.  Managing diabetes, if necessary.  Reducing stress. GET HELP IF:  Your chest pain does not go away, even after treatment.  You have a rash with blisters on your chest.  You have a fever. GET HELP RIGHT AWAY IF:  Your chest pain  is worse.  You have an increasing cough, or you cough up blood.  You have severe belly (abdominal) pain.  You feel extremely weak.  You pass out (faint).  You have chills.  You have sudden, unexplained chest discomfort.  You have sudden, unexplained discomfort in your arms, back, neck, or jaw.  You have shortness of breath at any time.  You suddenly start to sweat, or your skin gets clammy.  You feel nauseous.  You vomit.  You suddenly feel light-headed or dizzy.  Your heart begins to beat quickly, or it feels like it is skipping beats. These symptoms may be an emergency. Do  not wait to see if the symptoms will go away. Get medical help right away. Call your local emergency services (911 in the [place] Do not drive yourself to the hospital.   This information is not intended to replace advice given to you by your health care provider. Make sure you discuss any questions you have with your health care provider.   Document Released: 08/19/2007 Document Revised: 03/23/2014 Document Reviewed: 10/06/2013 Elsevier Interactive Patient Education [employer].

## 2015-03-22 ENCOUNTER — Telehealth: Payer: Self-pay | Admitting: *Deleted

## 2015-03-22 ENCOUNTER — Encounter: Payer: Self-pay | Admitting: Physician Assistant

## 2015-03-22 NOTE — Telephone Encounter (Signed)
Ptcb and has been notified of echo results by phone with verbal understanding.

## 2015-03-22 NOTE — Telephone Encounter (Signed)
Lmptcb to go over echo results

## 2015-04-17 DIAGNOSIS — Z9889 Other specified postprocedural states: Secondary | ICD-10-CM | POA: Diagnosis not present

## 2015-04-17 DIAGNOSIS — N280 Ischemia and infarction of kidney: Secondary | ICD-10-CM | POA: Diagnosis not present

## 2015-04-17 DIAGNOSIS — I7100 Dissection of unspecified site of aorta: Secondary | ICD-10-CM | POA: Diagnosis not present

## 2015-04-17 DIAGNOSIS — D649 Anemia, unspecified: Secondary | ICD-10-CM | POA: Diagnosis not present

## 2015-04-17 DIAGNOSIS — I10 Essential (primary) hypertension: Secondary | ICD-10-CM | POA: Diagnosis not present

## 2015-04-17 DIAGNOSIS — Z8679 Personal history of other diseases of the circulatory system: Secondary | ICD-10-CM | POA: Diagnosis not present

## 2015-04-17 DIAGNOSIS — N183 Chronic kidney disease, stage 3 (moderate): Secondary | ICD-10-CM | POA: Diagnosis not present

## 2015-05-13 ENCOUNTER — Ambulatory Visit (INDEPENDENT_AMBULATORY_CARE_PROVIDER_SITE_OTHER): Payer: Medicare Other | Admitting: Gastroenterology

## 2015-05-13 ENCOUNTER — Encounter: Payer: Self-pay | Admitting: Gastroenterology

## 2015-05-13 VITALS — BP 130/70 | HR 72 | Ht 71.0 in | Wt 155.4 lb

## 2015-05-13 DIAGNOSIS — K5900 Constipation, unspecified: Secondary | ICD-10-CM | POA: Diagnosis not present

## 2015-05-13 DIAGNOSIS — R1084 Generalized abdominal pain: Secondary | ICD-10-CM | POA: Diagnosis not present

## 2015-05-13 DIAGNOSIS — K921 Melena: Secondary | ICD-10-CM

## 2015-05-13 MED ORDER — NA SULFATE-K SULFATE-MG SULF 17.5-3.13-1.6 GM/177ML PO SOLN: 1.0000 | Freq: Once | ORAL | Status: DC

## 2015-05-13 NOTE — Progress Notes (Signed)
    History of Present Illness: This is 45 year old male with a history of a type B aortic dissection, repaired at [facility] in 2016 with aortic stent graft, aortic valve replacement. He has ongoing constipation, rectal pain and small volume rectal bleeding. The rectal bleeding has not been active the past several weeks. He was scheduled for colonoscopy in November 2016 for the same problems however when he arrived he complained of chest discomfort and his blood pressure was significantly elevated. The procedure was canceled and he was sent to the ED for evaluation. He has since been following closely with his primary care physician and his blood pressure has been under very good control. He has sternal area chest pain that is felt to be related to his surgery. Echo on 03/21/15 showed EF 55-60%  CT chest/abd/pelvis on 03/21/15 IMPRESSION: 1. Thoracoabdominal dissection post aortic valve replacement and repair with stent graft. The overall appearance is stable from prior exams. 2. Cardiomegaly. Minimal contrast refluxing into the hepatic veins and IVC may reflect fluid overload/right heart failure. 3. Trace pleural effusions, unchanged. 4. Limited assessment of intra-abdominal organs due to arterial phase imaging and paucity of intra-abdominal fat. No definite acute abnormality is seen.  Current Medications, Allergies, Past Medical History, Past Surgical History, Family History and Social History were reviewed in [employer] record.  Physical Exam: General: Well developed, well nourished, no acute distress Head: Normocephalic and atraumatic Eyes:  sclerae anicteric, EOMI Ears: Normal auditory acuity Mouth: No deformity or lesions Lungs: Clear throughout to auscultation Heart: prosthetic valve sounds, systolic murmur.  Abdomen: Soft, non tender and non distended. No masses, hepatosplenomegaly or hernias noted. Normal Bowel sounds Rectal: deferred to colonoscopy Musculoskeletal:  Symmetrical with no gross deformities  Pulses:  Normal pulses noted Extremities: No clubbing, cyanosis, edema or deformities noted Neurological: Alert oriented x 4, grossly nonfocal Psychological:  Alert and cooperative. Normal mood and affect  Assessment and Recommendations:  1. Constipation, rectal pain, small-volume hematochezia. Rule out colorectal neoplasms, hemorrhoids and other disorders. Miralax once or twice daily titrated for adequate bowel movements at least every other day. His blood pressure is under very good control and he appears stable for colonoscopy. Schedule colonoscopy. The risks, benefits, and alternatives to colonoscopy with possible biopsy, possible polypectomy and possible injection of hemorrhoids were discussed with the patient and they consent to proceed.   2. HTN. Continue close follow-up with PCP. Please take you BP medications every day including the day of the colonoscopy.   3. S/P Aortic valve replacement and aortic dissection repair. Continue follow-up at [facility].

## 2015-05-13 NOTE — Patient Instructions (Signed)
You need to take over the counter Miralax 1-2 x daily every day to prevent constipation.  You have been scheduled for a colonoscopy. Please follow written instructions given to you at your visit today.  Please pick up your prep supplies at the pharmacy within the next 1-3 days. If you use inhalers (even only as needed), please bring them with you on the day of your procedure. Your physician has requested that you go to www.startemmi.com and enter the access code given to you at your visit today. This web site gives a general overview about your procedure. However, you should still follow specific instructions given to you by our office regarding your preparation for the procedure.  Thank you for choosing me and [facility].  Pricilla Riffle. Dagoberto Ligas., MD., Marval Regal

## 2015-05-18 IMAGING — CT CT CHEST W/ CM
2 of 4 series · 15 of 36 positions shown, 18 images · IV contrast (APPLIED)
Comparison: Chest x-ray performed today.  Chest CT 09/08/2010

CLINICAL DATA: Widened mediastinum.  Left chest pain.

CT CHEST WITH CONTRAST
TECHNIQUE: Multidetector CT imaging of the chest was performed
following the standard protocol during bolus administration of
intravenous contrast.
Contrast: 80mL OMNIPAQUE IOHEXOL 300 MG/ML  SOLN

[Series 2: thorax 5.0 i31f 1 · axial · 0.71mm/px · z∈[+1186,+1456]mm · 12 of 64 slices shown, 15 images]
[im 5/64  mediastinal]
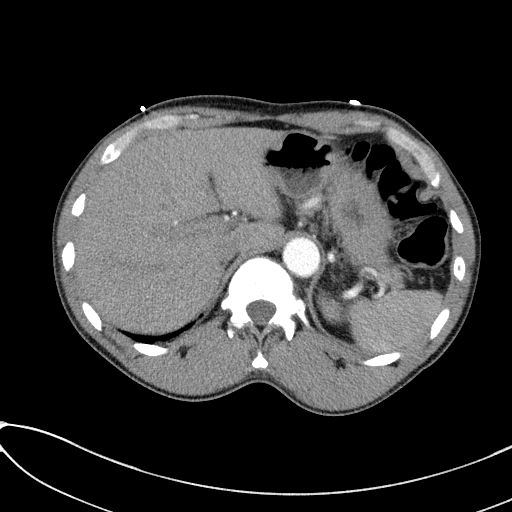
[im 5/64  lung]
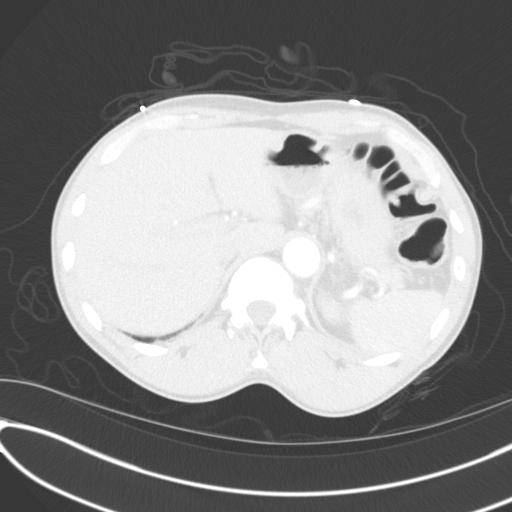
[im 10/64  lung]
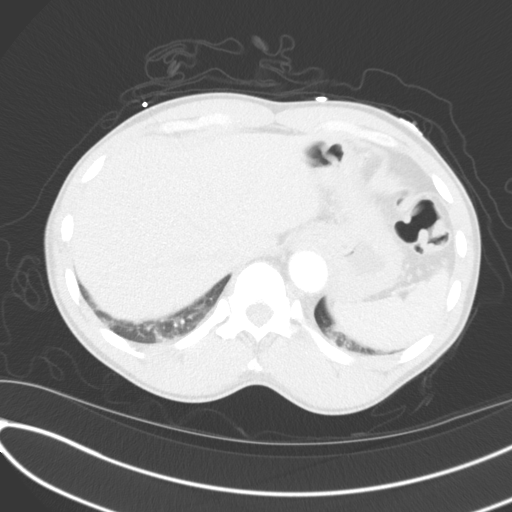
[im 15/64  lung]
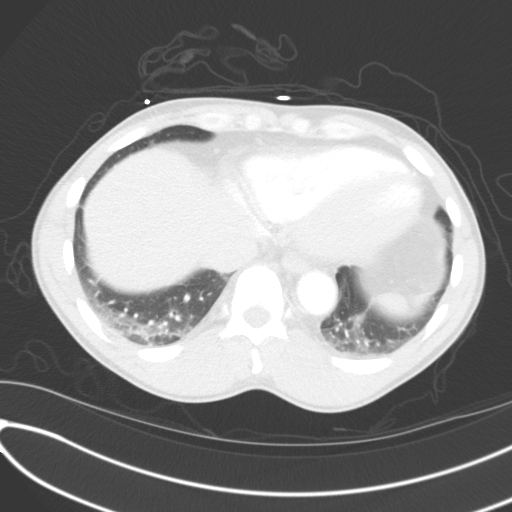
[im 20/64  lung]
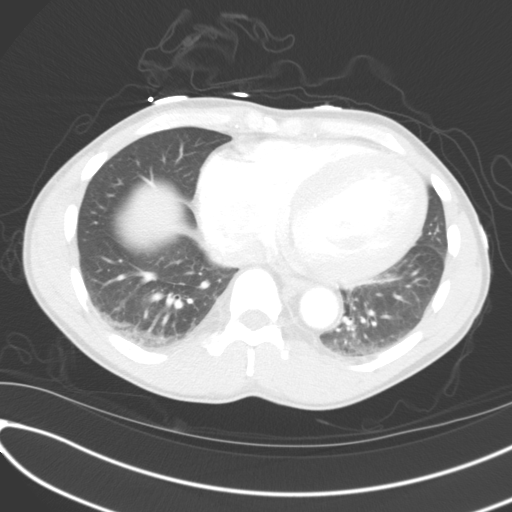
[im 25/64  mediastinal]
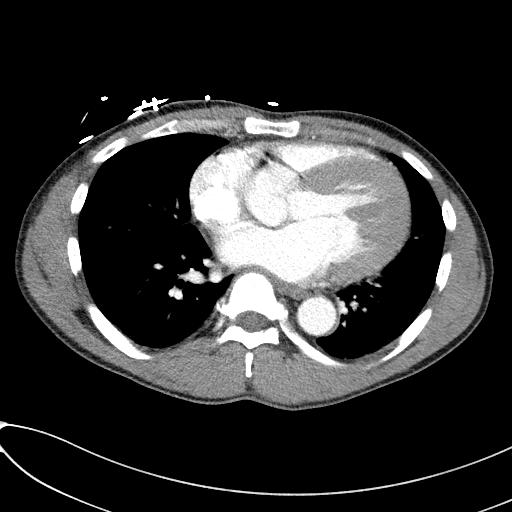
[im 25/64  lung]
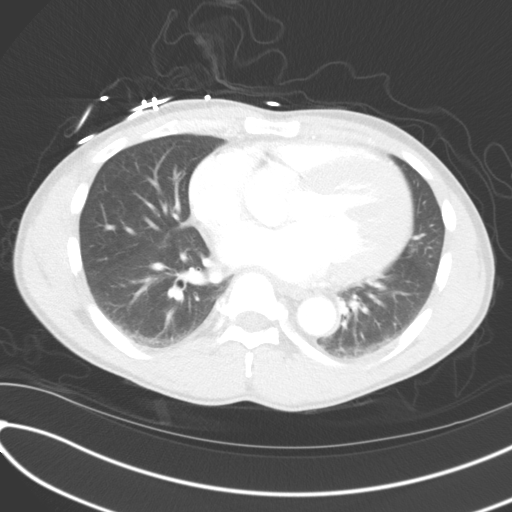
[im 30/64  lung]
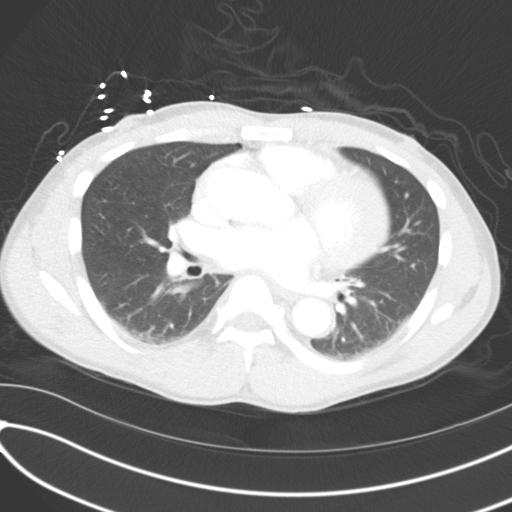
[im 34/64  lung]
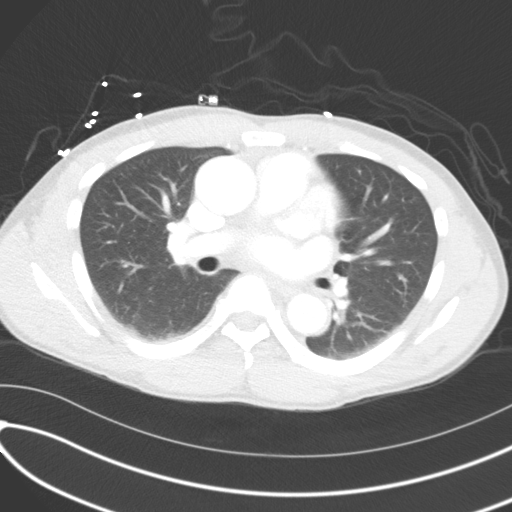
[im 39/64  lung]
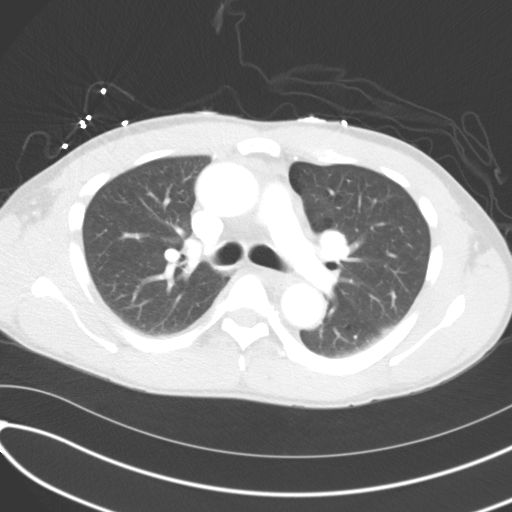
[im 44/64  mediastinal]
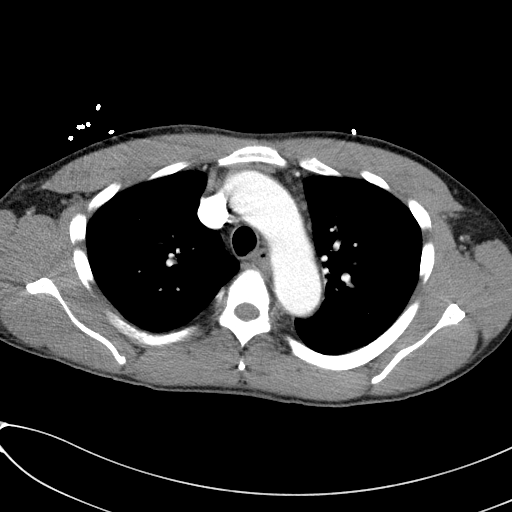
[im 44/64  lung]
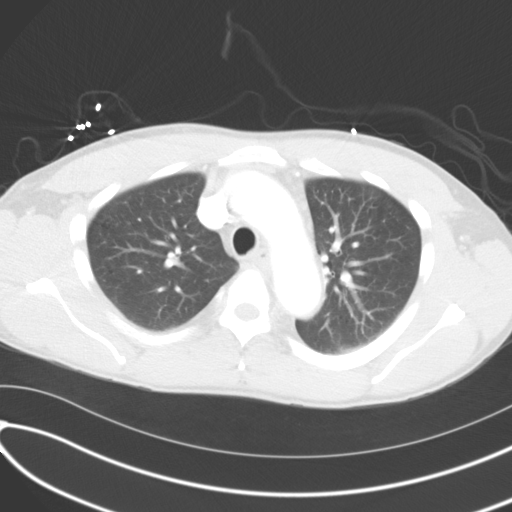
[im 49/64  lung]
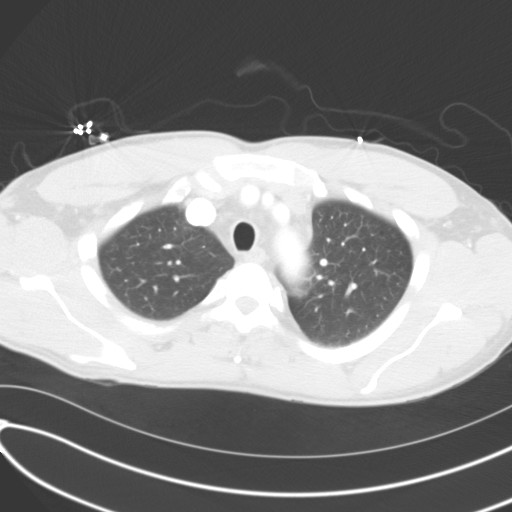
[im 54/64  lung]
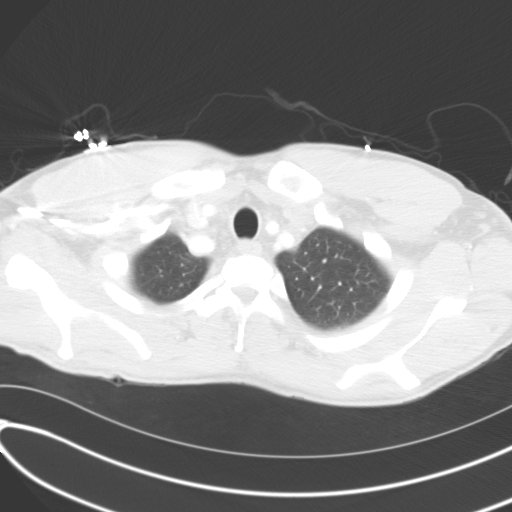
[im 59/64  lung]
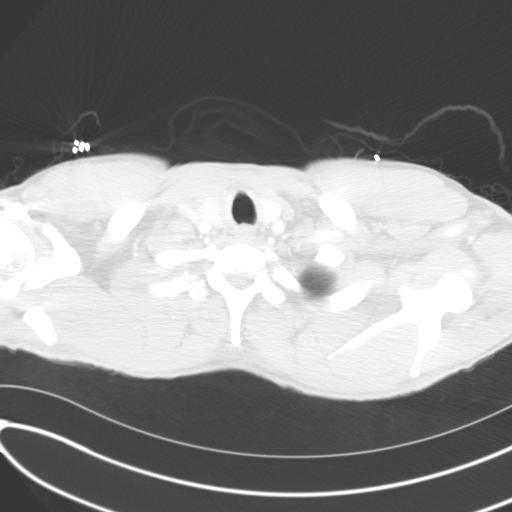

[Series 5: coronal · coronal · 0.62mm/px · 3 of 99 slices shown]
[im 20/99  lung]
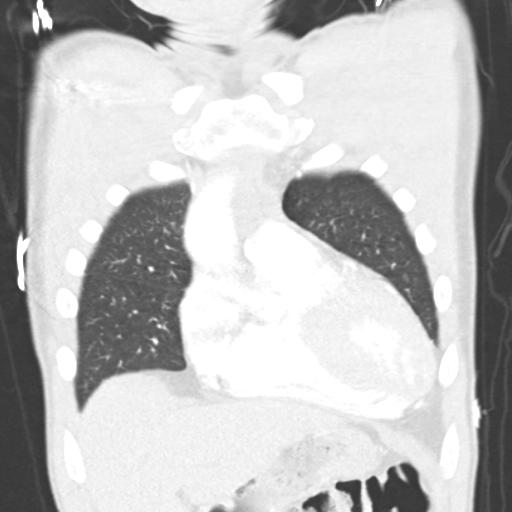
[im 40/99  lung]
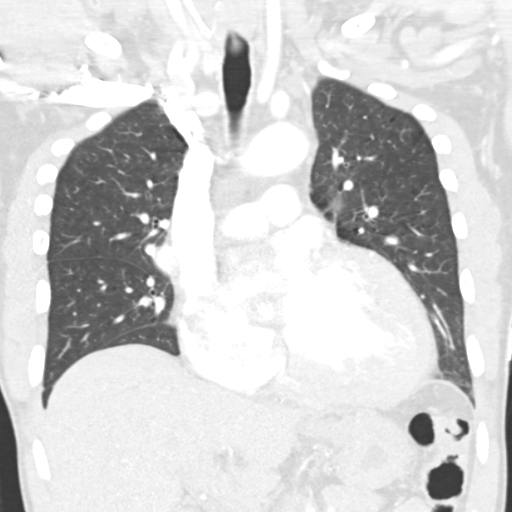
[im 59/99  lung]
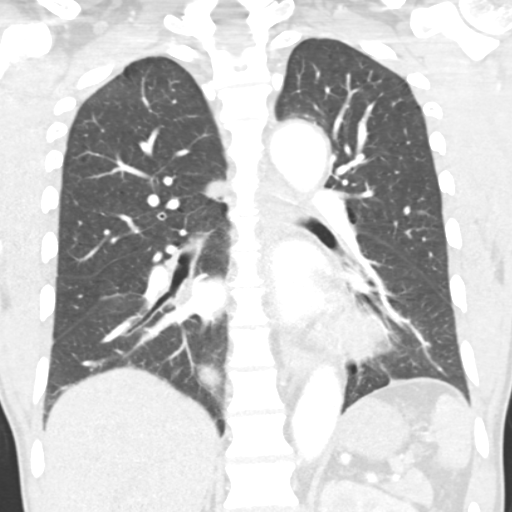

[15 of 36 positions shown; findings below may reference images not displayed]

FINDINGS: There is cardiomegaly.  The ascending aorta is tortuous
and slightly dilated, measuring 4.2 cm in diameter.  No dissection.
No mediastinal, hilar, or axillary adenopathy.  No filling defects
in the pulmonary arteries to suggest pulmonary emboli.

Dependent atelectasis in the lung bases.  Otherwise no focal
airspace opacities.  No effusions.  No pericardial effusion.
Visualized thyroid and chest wall soft tissues unremarkable.
Imaging into the upper abdomen shows no acute findings.
IMPRESSION: Prominent mediastinal contour on chest x-ray likely represents
tortuous and mildly dilated ascending thoracic aorta, 4.2 cm
maximally.

Cardiomegaly.

Dependent atelectasis.

## 2015-05-18 IMAGING — CR DG CHEST 2V
2 series · 2 of 2 positions shown · non-contrast
Comparison: 01/24/2012

CLINICAL DATA: Chest pain

CHEST - 2 VIEW

[w chest pa]
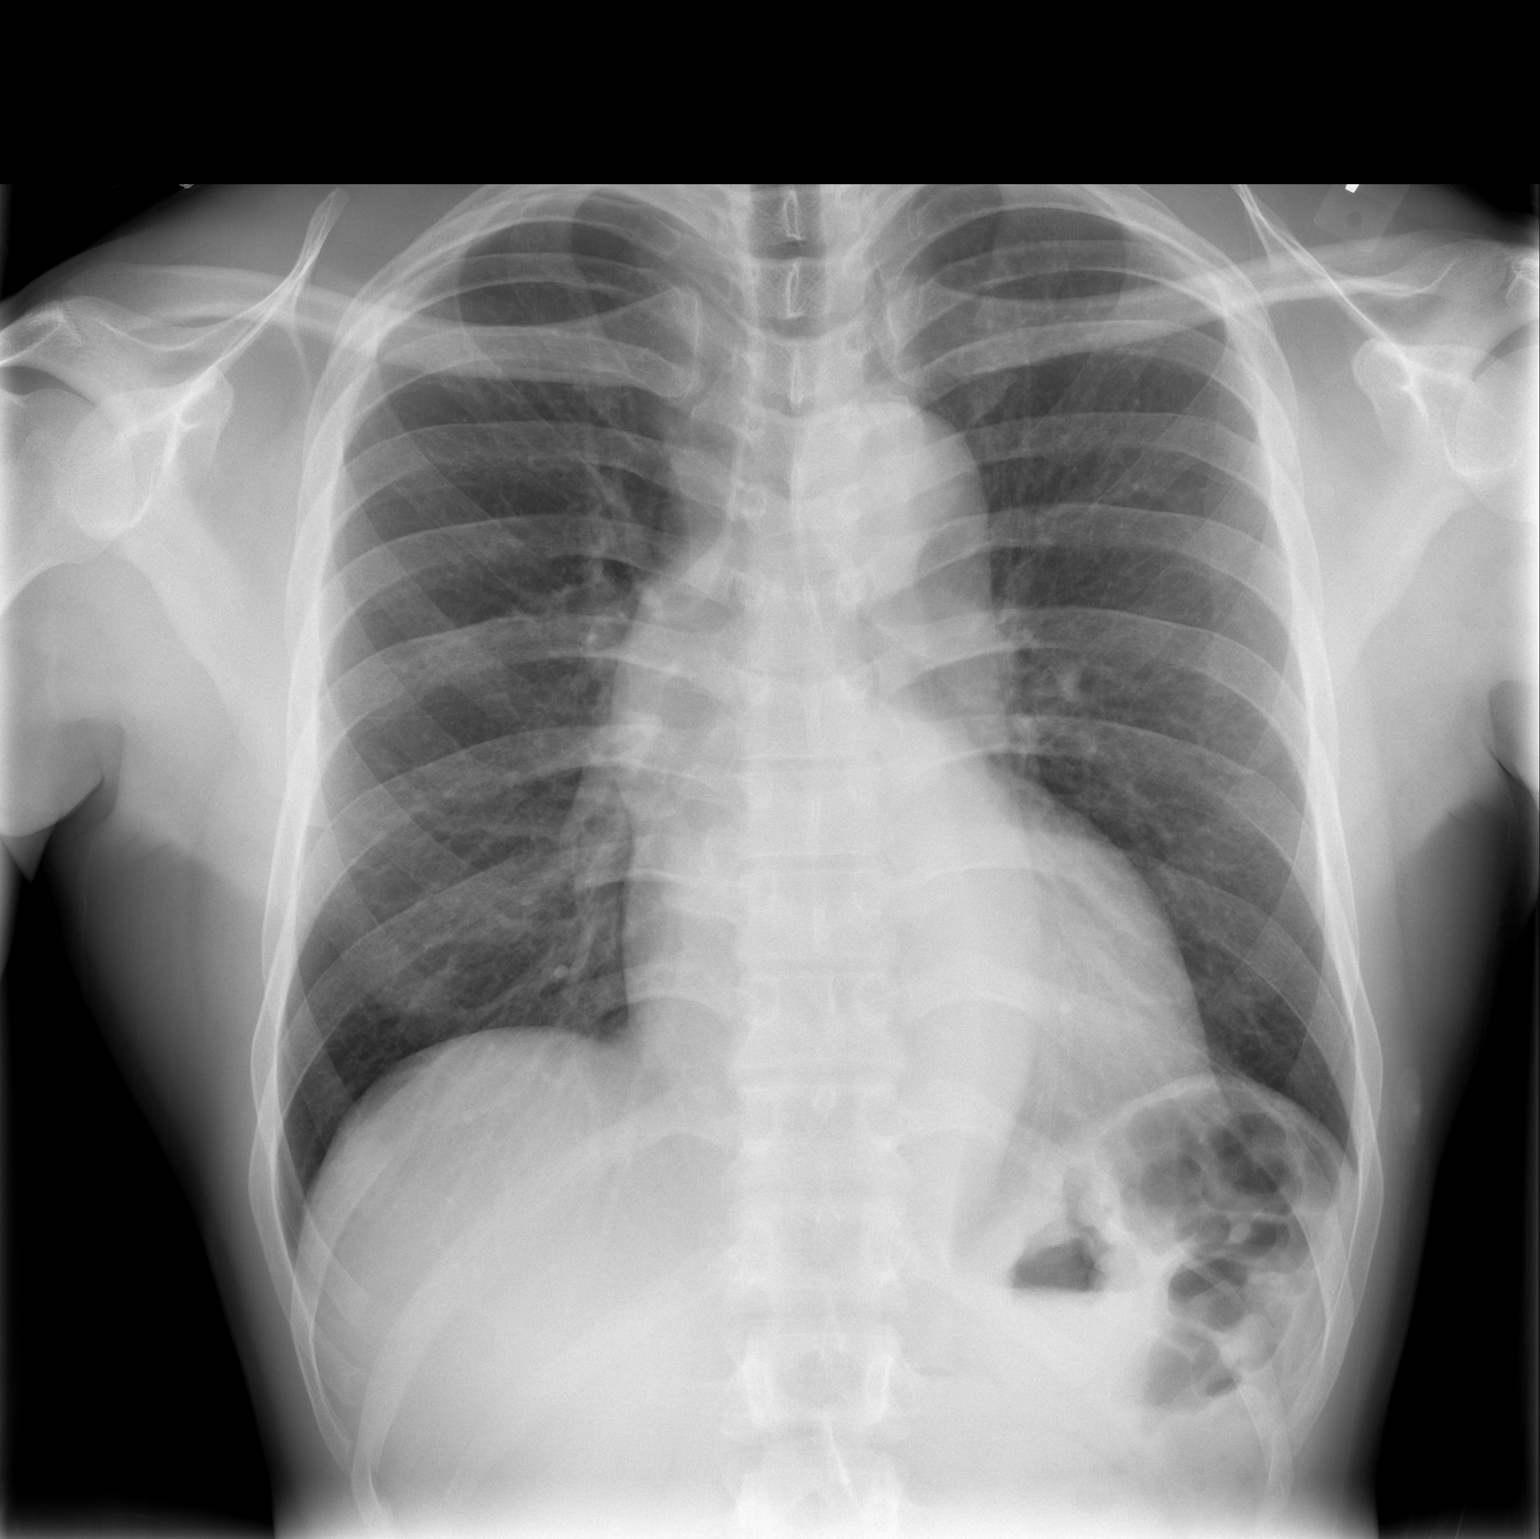

[w chest lat]
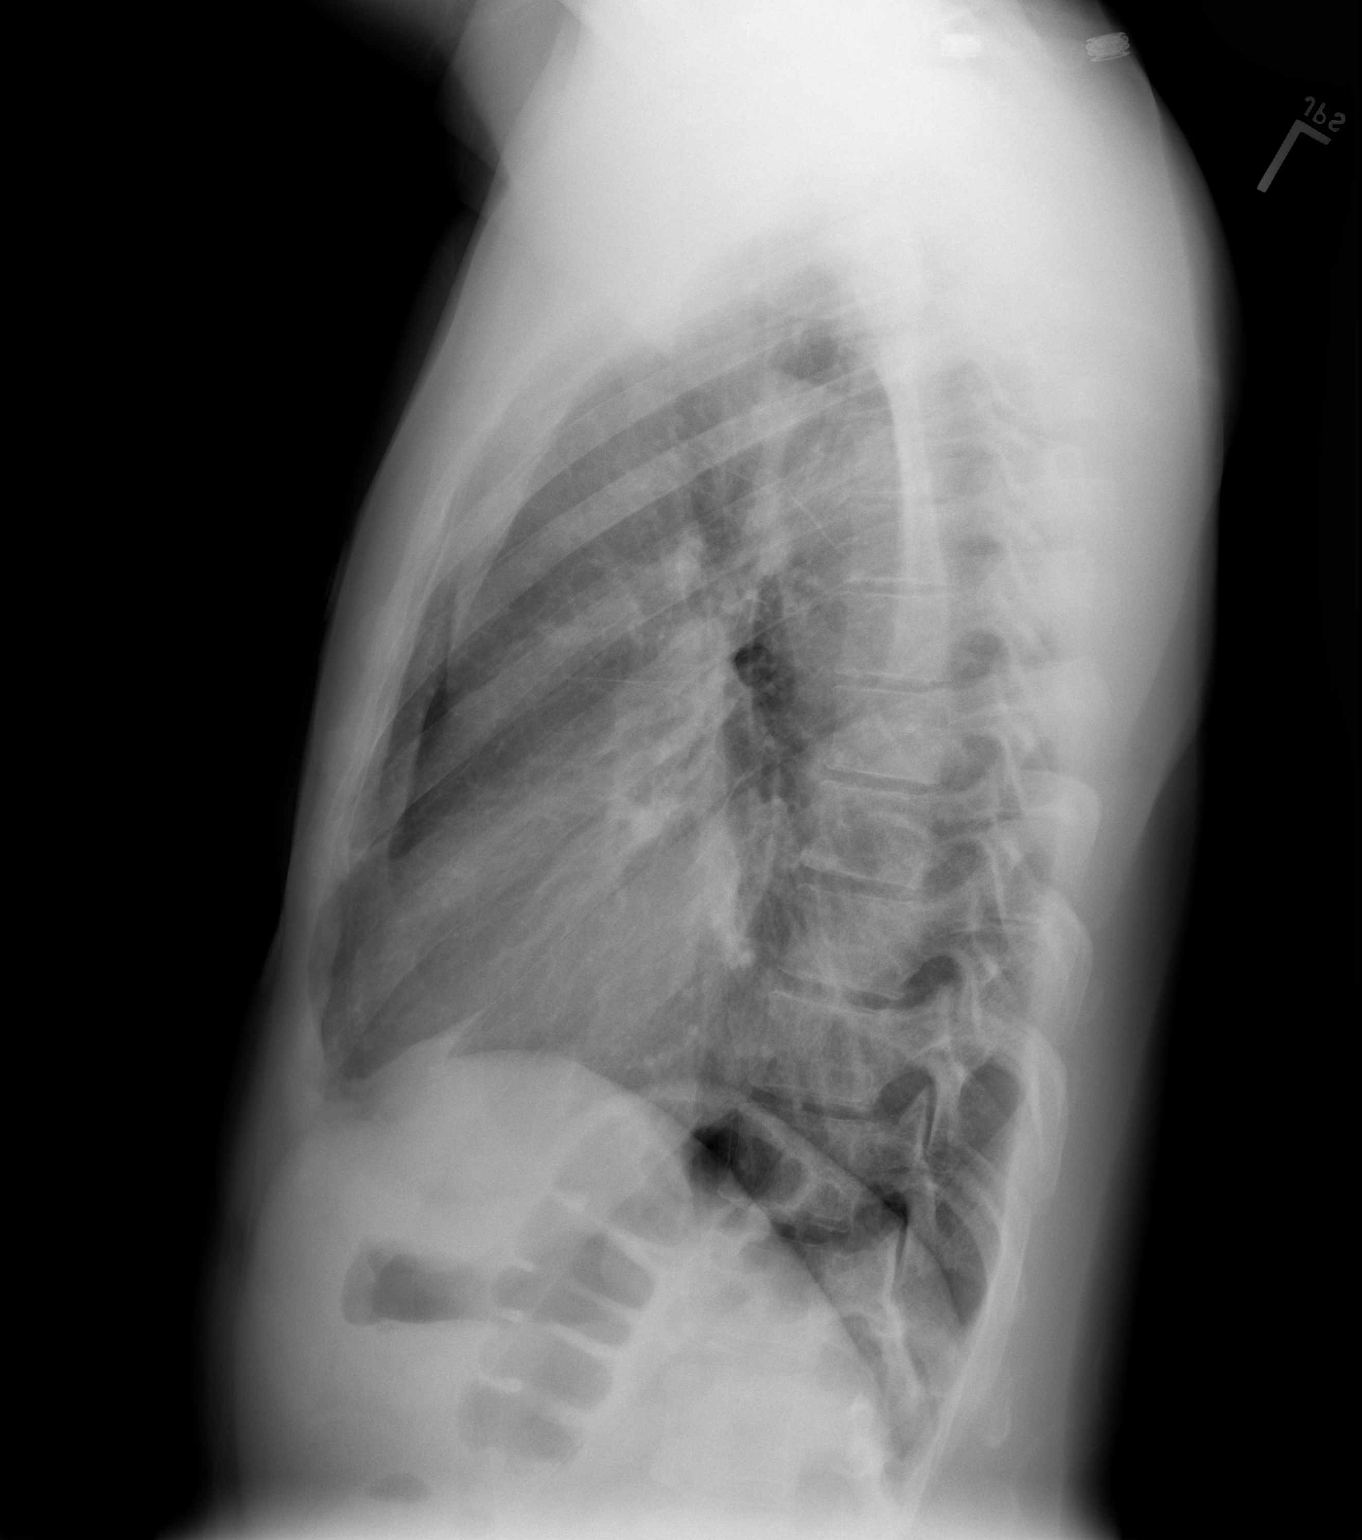

[2 of 2 positions shown; findings below may reference images not displayed]

FINDINGS: Two-view exam of the chest shows no edema or focal
airspace consolidation.  Cardiopericardial silhouette is upper
limits of normal for size.  There is new prominence of the right
mediastinum. Imaged bony structures of the thorax are intact.
IMPRESSION: New right mediastinal prominence.  This may be related to
enlargement of the ascending aorta, but mediastinal mass or
lymphadenopathy is not excluded.  CT chest with contrast is
recommended to further evaluate.

## 2015-06-03 ENCOUNTER — Emergency Department (HOSPITAL_COMMUNITY): Payer: Medicare Other

## 2015-06-03 ENCOUNTER — Encounter (HOSPITAL_COMMUNITY): Payer: Self-pay | Admitting: Emergency Medicine

## 2015-06-03 ENCOUNTER — Emergency Department (HOSPITAL_COMMUNITY)
Admission: EM | Admit: 2015-06-03 | Discharge: 2015-06-03 | Disposition: A | Discharge status: Home / Self Care | Payer: Medicare Other | Attending: Emergency Medicine | Admitting: Emergency Medicine

## 2015-06-03 DIAGNOSIS — K8020 Calculus of gallbladder without cholecystitis without obstruction: Secondary | ICD-10-CM | POA: Diagnosis not present

## 2015-06-03 DIAGNOSIS — Z79899 Other long term (current) drug therapy: Secondary | ICD-10-CM | POA: Diagnosis not present

## 2015-06-03 DIAGNOSIS — F17210 Nicotine dependence, cigarettes, uncomplicated: Secondary | ICD-10-CM | POA: Insufficient documentation

## 2015-06-03 DIAGNOSIS — Z9119 Patient's noncompliance with other medical treatment and regimen: Secondary | ICD-10-CM | POA: Diagnosis not present

## 2015-06-03 DIAGNOSIS — R011 Cardiac murmur, unspecified: Secondary | ICD-10-CM | POA: Diagnosis not present

## 2015-06-03 DIAGNOSIS — I5042 Chronic combined systolic (congestive) and diastolic (congestive) heart failure: Secondary | ICD-10-CM | POA: Insufficient documentation

## 2015-06-03 DIAGNOSIS — F419 Anxiety disorder, unspecified: Secondary | ICD-10-CM | POA: Insufficient documentation

## 2015-06-03 DIAGNOSIS — I2510 Atherosclerotic heart disease of native coronary artery without angina pectoris: Secondary | ICD-10-CM | POA: Insufficient documentation

## 2015-06-03 DIAGNOSIS — I129 Hypertensive chronic kidney disease with stage 1 through stage 4 chronic kidney disease, or unspecified chronic kidney disease: Secondary | ICD-10-CM | POA: Diagnosis not present

## 2015-06-03 DIAGNOSIS — E785 Hyperlipidemia, unspecified: Secondary | ICD-10-CM | POA: Insufficient documentation

## 2015-06-03 DIAGNOSIS — R0789 Other chest pain: Secondary | ICD-10-CM | POA: Diagnosis not present

## 2015-06-03 DIAGNOSIS — R079 Chest pain, unspecified: Secondary | ICD-10-CM | POA: Diagnosis present

## 2015-06-03 DIAGNOSIS — N189 Chronic kidney disease, unspecified: Secondary | ICD-10-CM | POA: Diagnosis not present

## 2015-06-03 DIAGNOSIS — R109 Unspecified abdominal pain: Secondary | ICD-10-CM | POA: Diagnosis not present

## 2015-06-03 LAB — BASIC METABOLIC PANEL
Anion gap: 11 (ref 5–15)
BUN: 10 mg/dL (ref 6–20)
CHLORIDE: 108 mmol/L (ref 101–111)
CO2: 22 mmol/L (ref 22–32)
CREATININE: 1.36 mg/dL — AB (ref 0.61–1.24)
Calcium: 9.2 mg/dL (ref 8.9–10.3)
Glucose, Bld: 102 mg/dL — ABNORMAL HIGH (ref 65–99)
POTASSIUM: 3.7 mmol/L (ref 3.5–5.1)
SODIUM: 141 mmol/L (ref 135–145)

## 2015-06-03 LAB — HEPATIC FUNCTION PANEL
ALBUMIN: 3.6 g/dL (ref 3.5–5.0)
ALT: 25 U/L (ref 17–63)
AST: 26 U/L (ref 15–41)
Alkaline Phosphatase: 106 U/L (ref 38–126)
BILIRUBIN INDIRECT: 0.5 mg/dL (ref 0.3–0.9)
Bilirubin, Direct: 0.1 mg/dL (ref 0.1–0.5)
TOTAL PROTEIN: 7.9 g/dL (ref 6.5–8.1)
Total Bilirubin: 0.6 mg/dL (ref 0.3–1.2)

## 2015-06-03 LAB — CBC
HCT: 37 % — ABNORMAL LOW (ref 39.0–52.0)
Hemoglobin: 11.7 g/dL — ABNORMAL LOW (ref 13.0–17.0)
MCH: 27.1 pg (ref 26.0–34.0)
MCHC: 31.6 g/dL (ref 30.0–36.0)
MCV: 85.8 fL (ref 78.0–100.0)
PLATELETS: 120 10*3/uL — AB (ref 150–400)
RBC: 4.31 MIL/uL (ref 4.22–5.81)
RDW: 18.8 % — ABNORMAL HIGH (ref 11.5–15.5)
WBC: 4.7 10*3/uL (ref 4.0–10.5)

## 2015-06-03 LAB — LIPASE, BLOOD: LIPASE: 27 U/L (ref 11–51)

## 2015-06-03 LAB — I-STAT TROPONIN, ED: TROPONIN I, POC: 0 ng/mL (ref 0.00–0.08)

## 2015-06-03 MED ORDER — METOCLOPRAMIDE HCL 5 MG/ML IJ SOLN
5.0000 mg | Freq: Once | INTRAMUSCULAR | Status: AC
  Administered 2015-06-03: 5 mg via INTRAVENOUS
  Filled 2015-06-03: qty 2

## 2015-06-03 MED ORDER — PANTOPRAZOLE SODIUM 20 MG PO TBEC: 20.0000 mg | DELAYED_RELEASE_TABLET | Freq: Every day | ORAL | Status: DC

## 2015-06-03 MED ORDER — CLONIDINE HCL 0.2 MG PO TABS: 0.2000 mg | ORAL_TABLET | Freq: Once | ORAL | Status: DC

## 2015-06-03 MED ORDER — MORPHINE SULFATE (PF) 4 MG/ML IV SOLN
4.0000 mg | Freq: Once | INTRAVENOUS | Status: AC
  Administered 2015-06-03: 4 mg via INTRAVENOUS
  Filled 2015-06-03: qty 1

## 2015-06-03 MED ORDER — HYDRALAZINE HCL 25 MG PO TABS: 50.0000 mg | ORAL_TABLET | Freq: Once | ORAL | Status: DC

## 2015-06-03 MED ORDER — HYDROMORPHONE HCL 1 MG/ML IJ SOLN
0.5000 mg | Freq: Once | INTRAMUSCULAR | Status: DC
Start: 2015-06-03 — End: 2015-06-03

## 2015-06-03 MED ORDER — DIPHENHYDRAMINE HCL 50 MG/ML IJ SOLN
12.5000 mg | Freq: Once | INTRAMUSCULAR | Status: AC
  Administered 2015-06-03: 12.5 mg via INTRAVENOUS
  Filled 2015-06-03: qty 1

## 2015-06-03 MED ORDER — MECLIZINE HCL 25 MG PO TABS
25.0000 mg | ORAL_TABLET | Freq: Three times a day (TID) | ORAL | Status: DC | PRN
Start: 2015-06-03 — End: 2015-09-09

## 2015-06-03 MED ORDER — MORPHINE SULFATE 15 MG PO TABS: 15.0000 mg | ORAL_TABLET | ORAL | Status: DC | PRN

## 2015-06-03 MED ORDER — HYDROMORPHONE HCL 1 MG/ML IJ SOLN
1.0000 mg | Freq: Once | INTRAMUSCULAR | Status: AC
  Administered 2015-06-03: 1 mg via INTRAMUSCULAR
  Filled 2015-06-03: qty 1

## 2015-06-03 MED ORDER — ONDANSETRON HCL 4 MG/2ML IJ SOLN
4.0000 mg | Freq: Once | INTRAMUSCULAR | Status: DC
  Filled 2015-06-03: qty 2

## 2015-06-03 MED ORDER — LABETALOL HCL 200 MG PO TABS: 600.0000 mg | ORAL_TABLET | Freq: Once | ORAL | Status: DC

## 2015-06-03 MED ORDER — PANTOPRAZOLE SODIUM 40 MG IV SOLR
40.0000 mg | Freq: Once | INTRAVENOUS | Status: AC
  Administered 2015-06-03: 40 mg via INTRAVENOUS
  Filled 2015-06-03: qty 40

## 2015-06-03 NOTE — ED Provider Notes (Signed)
CSN: [id number]     Arrival date & time 06/03/15  6301 History   First MD Initiated Contact with Patient 06/03/15 [phone]     Chief Complaint  Patient presents with  . Chest Pain     (Consider location/radiation/quality/duration/timing/severity/associated sxs/prior Treatment) HPI  [name] is a(n) 45 y.o. male who presents to the Ed with cc of epigastric and RUQ abdominal pain. He  Has a complicated past medical history including uncontrolled hypertension, thoracic or abdominal aortic dissection, post aortic valve replacement at [facility]. Due to history of alcohol abuse which he states he quit in 2013. He states that since his valvular repair in June. He has had problems with pain in the epigastrium and right upper quadrant that radiates into his chest, right shoulder and right scapular region. He states that he has intermittent associated nausea and cold sweats with the pain. He has been evaluated by gastroenterology. He has a history of GERD. He states that he has complied with dietary changes. He had some constipation which was relieved intermittently with laxative use, however, came back afterwards. The patient states that he has been out of town working on a house and had his bag of medication accidentally thrown on the leg. He was without his antihypertensive medications for the past 5 days. He states that he is able to get them last night and took his first dose this morning. He feels like his pain has been worse over the past week. He denies alcohol intake, heavy NSAID use. He does not take any GERD medications. He states that his pain is worse when standing, better at rest. When the pain is severe. He has shortness of breath. He denies fevers, chills, myalgias, constipation, diarrhea, urinary symptoms, flank pain.  Past Medical History  Diagnosis Date  . Hypertension     a. Hx of HTN urgency secondary to noncompliance. b. urinary metanephrine and catecholeamine levels normal 2013.  c. Renal  art [name] 1/16:  No evidence of renal artery stenosis noted bilaterally.  . Anxiety   . GERD (gastroesophageal reflux disease)   . Chronic combined systolic and diastolic congestive heart failure ([place])     a. 05/2011: Adm with pulm edema/HTN urgency, EF 35-40% with diffuse hypokinesis and moderate to severe mitral regurgitation. Cardiomyopathy likely due to uncontrolled HTN and ETOH abuse - cath deferred due to renal insufficiency (felt due to uncontrolled HTN). bJodie Echevaria MV 06/2011: EF 37% and no ischemia or infarction. c. EF 45-50% by echo 01/2012.  . Cardiomyopathy ([place])     a. non-ischemic - probably related to untreated HTN and ETOH abuse - Echo 3/13 with EF 35-40% >> b. Echo 4/16: Severe LVH, EF 55-60%, moderate AI, moderate MR, mild LAE, trivial effusion, known type B dissection with communication between true and false lumens with suprasternal images suggesting dissection plane may propagate to at least left subclavian takeoff, root above aortic valve okay    . HYPERLIPIDEMIA   . INGUINAL HERNIA   . ETOH abuse     a. Reported to have quit 05/2011.  . Valvular heart disease     a. Echo 05/2011: moderate to severe eccentric MR and mild to moderate AI with prolapsing left coronary cusp. b. Echo 01/2012: mild-mod AI, mild dilitation of aortic root, mild MR.;  c. Echo 1/16: Severe LVH consistent with hypertrophic cardio myopathy, EF 50%, no RWMA, mod AI, mild MR, mild RAE, dilated Ao root (40 mm);   . CKD (chronic kidney disease)  a. 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  peak creatinine 3.46 during admx for aortic dissection 2/16  . Tobacco abuse   . Pneumonia ~ 2013  . Headache(784.0)     "q other day" (08/08/2013)  . Chronic sinusitis   . History of medication noncompliance   . Chronic abdominal pain   . DDD (degenerative disc disease), lumbar   . Aortic dissection ([facility])     a. admx 04/2014 >> L renal infarct; a/c renal failure >> b.  s/p Bioprosthetic Bentall and total arch replacement and staged  endovascular repair of descending aortic aneurysm ([facility] - Dr. [name])  . CAD (coronary artery disease)     a. LHC 4/16:  oD1 60%  . Renal insufficiency   . Aortic disease ([place])   . History of echocardiogram     Echo 1/17:  Severe LVH, EF 55-60%, no RWMA, Gr 2 DD, AVR ok, mild to mod MR, mild LAE, mild reduced RVSF, mod RAE   Past Surgical History  Procedure Laterality Date  . Ankle surgery      Fractures bilaterally  . Inguinal hernia repair Right ~ 1996  . Foot fracture surgery Bilateral 2004-2010    "got pins in both of them"  . Left heart catheterization with coronary angiogram N/A 06/21/2014    Procedure: LEFT HEART CATHETERIZATION WITH CORONARY ANGIOGRAM;  Surgeon: Larey Dresser, MD;  Location: [facility] CATH LAB;  Service: Cardiovascular;  Laterality: N/A;  . Aortic valve surgery     Family History  Problem Relation Age of Onset  . Hypertension    . Colon cancer Paternal Uncle   . Stroke Maternal Aunt   . Heart attack Brother   . Diabetes Maternal Aunt   . Throat cancer Neg Hx   . Pancreatic cancer Neg Hx   . Esophageal cancer Neg Hx   . Kidney disease Neg Hx   . Liver disease Neg Hx    Social History  Substance Use Topics  . Smoking status: Current Every Day Smoker -- 0.25 packs/day for 23 years    Types: Cigarettes    Last Attempt to Quit: 05/03/2014  . Smokeless tobacco: Former Systems developer    Types: Chew     Comment: as of 05-22-14 down to 3-4 cigs daily  . Alcohol Use: No     Comment: + previous use    Review of Systems   Ten systems reviewed and are negative for acute change, except as noted in the HPI.   Allergies  Imdur and Tramadol  Home Medications   Prior to Admission medications   Medication Sig Start Date End Date Taking? Authorizing Provider  acetaminophen (TYLENOL) 325 MG tablet Take 650 mg by mouth every 6 (six) hours as needed (pain).    Historical Provider, MD  atorvastatin (LIPITOR) 20 MG tablet Take 20 mg by mouth daily. 02/07/15   Historical  Provider, MD  cloNIDine (CATAPRES) 0.2 MG tablet Take 0.2 mg by mouth 2 (two) times daily.    Historical Provider, MD  hydrALAZINE (APRESOLINE) 50 MG tablet Take 50 mg by mouth 2 (two) times daily. 12/04/14   Historical Provider, MD  labetalol (NORMODYNE) 300 MG tablet Take 2 tablets (600 mg total) by mouth 2 (two) times daily. 12/14/14   Scott Joylene Draft, PA-C  Na Sulfate-K Sulfate-Mg Sulf SOLN Take 1 kit by mouth once. 05/13/15   Ladene Artist, MD   BP 195/106 mmHg  Pulse 66  Temp(Src) 98.2 F (36.8 C) (Oral)  Resp 16  Ht  '5\' 11"'  (1.803 m)  Wt 71.215 kg  BMI 21.91 kg/m2  SpO2 100% Physical Exam  Constitutional: He appears well-developed and well-nourished. No distress.  HENT:  Head: Normocephalic and atraumatic.  Eyes: Conjunctivae are normal. No scleral icterus.  Neck: Normal range of motion. Neck supple.  Cardiovascular: Normal rate and regular rhythm.  Exam reveals gallop.   Murmur heard. Pulmonary/Chest: Effort normal and breath sounds normal. No respiratory distress.  Abdominal: Soft. He exhibits no distension and no mass. There is tenderness in the right upper quadrant and epigastric area. There is guarding and positive Murphy's sign. There is no rebound.    Musculoskeletal: He exhibits no edema.  Neurological: He is alert.  Skin: Skin is warm and dry. He is not diaphoretic.  Psychiatric: His behavior is normal.  Nursing note and vitals reviewed.   ED Course  Procedures (including critical care time) Labs Review Labs Reviewed  CBC - Abnormal; Notable for the following:    Hemoglobin 11.7 (*)    HCT 37.0 (*)    RDW 18.8 (*)    All other components within normal limits  BASIC METABOLIC PANEL  HEPATIC FUNCTION PANEL  I-STAT TROPOININ, ED    Imaging Review No results found. I have personally reviewed and evaluated these images and lab results as part of my medical decision-making.   EKG Interpretation   Date/Time:  Monday June 03 2015 06:17:43 EDT Ventricular  Rate:  82 PR Interval:  136 QRS Duration: 104 QT Interval:  426 QTC Calculation: 497 R Axis:   32 Text Interpretation:  Normal sinus rhythm Biatrial enlargement Left  ventricular hypertrophy with repolarization abnormality Prolonged QT  Abnormal ECG No significant change since last tracing Confirmed by  Christy Gentles  MD, Elenore Rota ([id number]) on 06/03/2015 6:42:14 AM      MDM   Final diagnoses:  Calculus of gallbladder without cholecystitis without obstruction    7:32 AM BP 195/106 mmHg  Pulse 66  Temp(Src) 98.2 F (36.8 C) (Oral)  Resp 16  Ht '5\' 11"'  (1.803 m)  Wt 71.215 kg  BMI 21.91 kg/m2  SpO2 100% Patient with epigastric and right upper quadrant abdominal pain. Difficult to elicit Murphy's sign. As the patient is guarding. EKG is unchanged from previous. Blood pressure elevated at 195/106. Creatinine is at baseline. Waiting hepatic panel and lipase. Of ordered a right upper quadrant abdominal ultrasound.   Patient ultrasound shows cholelithiasis without cholecystitis. I doubt acute coronary syndrome as cause of his pain. He has had chronic pain since June, which is 10 months ago now. Patient's pain is improved with medications. Patient is to follow up with [place] surgery. We'll start the patient on a PPI. Pain medications given at discharge. Had multiple outpatient workups for the same pain. He appears safe for discharge at this time. Discussed return precautions.   Margarita Mail, PA-C 06/03/15 [place], DO 06/03/15 2104

## 2015-06-03 NOTE — ED Notes (Signed)
Patient reports that he has had intermittent chest pain since his surgery to replace his aorta July 2016. Reports this chest pain began a week ago Monday. Rates R sided chest pain 10/10 with nausea, diaphoresis and shortness of breath accompanying.

## 2015-06-03 NOTE — ED Notes (Signed)
Patient arrives with complaint of right upper chest pain with radiation to LLQ of abdomen. States onset 1 week ago. History of aortic repair of aneurism about 8 months ago. Also endorses SOB.

## 2015-06-03 NOTE — Discharge Instructions (Signed)
Cholelithiasis °Cholelithiasis (also called gallstones) is a form of gallbladder disease in which gallstones form in your gallbladder. The gallbladder is an organ that stores bile made in the liver, which helps digest fats. Gallstones begin as small crystals and slowly grow into stones. Gallstone pain occurs when the gallbladder spasms and a gallstone is blocking the duct. Pain can also occur when a stone passes out of the duct.  °RISK FACTORS °· Being male.   °· Having multiple pregnancies. Health care providers sometimes advise removing diseased gallbladders before future pregnancies.   °· Being obese. °· Eating a diet heavy in fried foods and fat.   °· Being older than 60 years and increasing age.   °· Prolonged use of medicines containing male hormones.   °· Having diabetes mellitus.   °· Rapidly losing weight.   °· Having a family history of gallstones (heredity).   °SYMPTOMS °· Nausea.   °· Vomiting. °· Abdominal pain.   °· Yellowing of the skin (jaundice).   °· Sudden pain. It may persist from several minutes to several hours. °· Fever.   °· Tenderness to the touch.  °In some cases, when gallstones do not move into the bile duct, people have no pain or symptoms. These are called "silent" gallstones.  °TREATMENT °Silent gallstones do not need treatment. In severe cases, emergency surgery may be required. Options for treatment include: °· Surgery to remove the gallbladder. This is the most common treatment. °· Medicines. These do not always work and may take 6-12 months or more to work. °· Shock wave treatment (extracorporeal biliary lithotripsy). In this treatment an ultrasound machine sends shock waves to the gallbladder to break gallstones into smaller pieces that can pass into the intestines or be dissolved by medicine. °HOME CARE INSTRUCTIONS  °· Only take over-the-counter or prescription medicines for pain, discomfort, or fever as directed by your health care provider.   °· Follow a low-fat diet until  seen again by your health care provider. Fat causes the gallbladder to contract, which can result in pain.   °· Follow up with your health care provider as directed. Attacks are almost always recurrent and surgery is usually required for permanent treatment.   °SEEK IMMEDIATE MEDICAL CARE IF:  °· Your pain increases and is not controlled by medicines.   °· You have a fever or persistent symptoms for more than 2-3 days.   °· You have a fever and your symptoms suddenly get worse.   °· You have persistent nausea and vomiting.   °MAKE SURE YOU:  °· Understand these instructions. °· Will watch your condition. °· Will get help right away if you are not doing well or get worse. °  °This information is not intended to replace advice given to you by your health care provider. Make sure you discuss any questions you have with your health care provider. °  °Document Released: 02/26/2005 Document Revised: 11/02/2012 Document Reviewed: 08/24/2012 °Elsevier Interactive Patient Education ©2016 [employer] Inc. ° °

## 2015-06-03 NOTE — ED Notes (Signed)
EDP at bedside  

## 2015-06-06 DIAGNOSIS — K5909 Other constipation: Secondary | ICD-10-CM | POA: Diagnosis not present

## 2015-06-07 DIAGNOSIS — I7103 Dissection of thoracoabdominal aorta: Secondary | ICD-10-CM | POA: Diagnosis not present

## 2015-06-07 DIAGNOSIS — I7101 Dissection of thoracic aorta: Secondary | ICD-10-CM | POA: Diagnosis not present

## 2015-06-07 DIAGNOSIS — Z95828 Presence of other vascular implants and grafts: Secondary | ICD-10-CM | POA: Diagnosis not present

## 2015-06-07 HISTORY — DX: Dissection of thoracoabdominal aorta: I71.03

## 2015-06-12 ENCOUNTER — Telehealth: Payer: Self-pay | Admitting: Gastroenterology

## 2015-06-12 MED ORDER — HYDROCORTISONE ACETATE 25 MG RE SUPP: 25.0000 mg | Freq: Two times a day (BID) | RECTAL | Status: DC

## 2015-06-12 MED ORDER — PRAMOXINE-HC 1-1 % EX CREA: TOPICAL_CREAM | Freq: Three times a day (TID) | CUTANEOUS | Status: DC

## 2015-06-12 NOTE — Telephone Encounter (Signed)
Patient is advised that we are not able to perform a hemorrhoid banding and a colonoscopy on the same day.  He is advised that once he has a colonoscopy Dr. Fuller Plan can assess his hemorrhoids and advise if they are amenable to hemorrhoid banding or if they will require a surgical repair.  He reports today he has a very large, very painful hemorrhoid.  He is performing sitz baths, using recticare, and other OTC creams with no relief.  He is scheduled for a colonoscopy for 06/26/15.  Dr. Fuller Plan do you have any additional recommendations until colonoscopy on 06/26/15 (no earlier endo appts at this point)

## 2015-06-12 NOTE — Telephone Encounter (Signed)
Left message for patient to call back  

## 2015-06-12 NOTE — Telephone Encounter (Signed)
Add HC cream bid and HC supp bid

## 2015-06-12 NOTE — Telephone Encounter (Signed)
Patient notified RX sent

## 2015-06-13 ENCOUNTER — Telehealth: Payer: Self-pay | Admitting: Gastroenterology

## 2015-06-13 MED ORDER — ACETAMINOPHEN-CODEINE #3 300-30 MG PO TABS: 1.0000 | ORAL_TABLET | Freq: Three times a day (TID) | ORAL | Status: DC | PRN

## 2015-06-13 NOTE — Telephone Encounter (Signed)
Please get him appt with APP in next day or so. Please find out what pain medication he has at home and if he has something encourage him to use it. If he has nothing available and can't see an APP today then Tramadol 50 mg po tid, #15, no refills.

## 2015-06-13 NOTE — Telephone Encounter (Signed)
Pt aware and script sent to pharmacy. 

## 2015-06-13 NOTE — Telephone Encounter (Signed)
Tylenol #3, 1 po tid prn, #15, no refills

## 2015-06-13 NOTE — Telephone Encounter (Signed)
Pt states he does not have any pain medication at home. Scheduled pt to see Alonza Bogus PA tomorrow at 1:30pm. Discussed with pt that we were going to call in tramadol for him and he states he has a reaction with tramadol. Listed under allergies that he has headaches with tramadol. Please advise.

## 2015-06-13 NOTE — Telephone Encounter (Signed)
Pt states he is still having a lot of rectal pain from the hemorrhoids. Reports he did not sleep at all last night. Pt is using the suppositories and the creams and soaking in the tub. Pt states it is hard to even walk due to the pain. Any other suggestions? Please advise.

## 2015-06-14 ENCOUNTER — Encounter: Payer: Self-pay | Admitting: Gastroenterology

## 2015-06-14 ENCOUNTER — Encounter: Payer: Self-pay | Admitting: General Surgery

## 2015-06-14 ENCOUNTER — Ambulatory Visit (INDEPENDENT_AMBULATORY_CARE_PROVIDER_SITE_OTHER): Payer: Medicare Other | Admitting: Gastroenterology

## 2015-06-14 ENCOUNTER — Encounter (HOSPITAL_COMMUNITY): Payer: Self-pay | Admitting: *Deleted

## 2015-06-14 VITALS — BP 164/92 | HR 72 | Ht 71.0 in | Wt 148.0 lb

## 2015-06-14 DIAGNOSIS — K625 Hemorrhage of anus and rectum: Secondary | ICD-10-CM | POA: Diagnosis not present

## 2015-06-14 DIAGNOSIS — K643 Fourth degree hemorrhoids: Secondary | ICD-10-CM | POA: Diagnosis not present

## 2015-06-14 DIAGNOSIS — K6289 Other specified diseases of anus and rectum: Secondary | ICD-10-CM | POA: Diagnosis not present

## 2015-06-14 DIAGNOSIS — K645 Perianal venous thrombosis: Secondary | ICD-10-CM | POA: Insufficient documentation

## 2015-06-14 NOTE — Progress Notes (Signed)
I notified Dr Oletta Lamas of patient cardiac history, Dr Oletta Lamas response was, we well evaluate in am when he arrives for surgery.

## 2015-06-14 NOTE — Progress Notes (Signed)
Reviewed and agree with management plan.  Taquilla Downum T. Adal Sereno, MD FACG 

## 2015-06-14 NOTE — Progress Notes (Signed)
     06/14/2015 [name] [id number] Aug 08, 1970   History of Present Illness:  45 year old male known to Dr. Fuller Plan to schedule colonoscopy for complaints of rectal bleeding.  This is scheduled for 2 weeks from now.  Presents to the office today as an urgent add-on for complaints of rectal pain and bleeding.  Severe rectal pain for the past 2 weeks.  Has been using Sitz baths and hydrocortisone cream/suppositories, but still extremely painful to sit, stand, walk, have a BM, etc.  Has bleeding from the area as well.   Current Medications, Allergies, Past Medical History, Past Surgical History, Family History and Social History were reviewed in [employer] record.   Physical Exam: BP 164/92 mmHg  Pulse 72  Ht 5\' 11"  (1.803 m)  Wt 148 lb (67.132 kg)  BMI 20.65 kg/m2 General: Well developed black male in no acute distress Head: Normocephalic and atraumatic Eyes:  Sclerae anicteric, conjunctiva pink  Ears: Normal auditory acuity Rectal:  Large hemorrhoids noted.  One exquisitely tender, thrombosed hemorrhoid noted centrally. Musculoskeletal: Symmetrical with no gross deformities  Extremities: No edema  Neurological: Alert oriented x 4, grossly non-focal Psychological:  Alert and cooperative. Normal mood and affect  Assessment and Recommendations: -Large hemorrhoids, one large thrombosed hemorrhoid:  Dr. Excell Seltzer kindly agreed to see the patient today in clinic.  We appreciate his help and cooperation.

## 2015-06-14 NOTE — Patient Instructions (Addendum)
Be at [place]  Before 5 pm today to see Dr [facility]  [phone]    Phone [phone]    Hemorrhoids Hemorrhoids are swollen veins around the rectum or anus. There are two types of hemorrhoids:   Internal hemorrhoids. These occur in the veins just inside the rectum. They may poke through to the outside and become irritated and painful.  External hemorrhoids. These occur in the veins outside the anus and can be felt as a painful swelling or hard lump near the anus. CAUSES  Pregnancy.   Obesity.   Constipation or diarrhea.   Straining to have a bowel movement.   Sitting for long periods on the toilet.  Heavy lifting or other activity that caused you to strain.  Anal intercourse. SYMPTOMS   Pain.   Anal itching or irritation.   Rectal bleeding.   Fecal leakage.   Anal swelling.   One or more lumps around the anus.  DIAGNOSIS  Your caregiver may be able to diagnose hemorrhoids by visual examination. Other examinations or tests that may be performed include:   Examination of the rectal area with a gloved hand (digital rectal exam).   Examination of anal canal using a small tube (scope).   A blood test if you have lost a significant amount of blood.  A test to look inside the colon (sigmoidoscopy or colonoscopy). TREATMENT Most hemorrhoids can be treated at home. However, if symptoms do not seem to be getting better or if you have a lot of rectal bleeding, your caregiver may perform a procedure to help make the hemorrhoids get smaller or remove them completely. Possible treatments include:   Placing a rubber band at the base of the hemorrhoid to cut off the circulation (rubber band ligation).   Injecting a chemical to shrink the hemorrhoid (sclerotherapy).   Using a tool to burn the hemorrhoid (infrared light therapy).   Surgically removing the hemorrhoid (hemorrhoidectomy).   Stapling the hemorrhoid  to block blood flow to the tissue (hemorrhoid stapling).  HOME CARE INSTRUCTIONS   Eat foods with fiber, such as whole grains, beans, nuts, fruits, and vegetables. Ask your doctor about taking products with added fiber in them (fibersupplements).  Increase fluid intake. Drink enough water and fluids to keep your urine clear or pale yellow.   Exercise regularly.   Go to the bathroom when you have the urge to have a bowel movement. Do not wait.   Avoid straining to have bowel movements.   Keep the anal area dry and clean. Use wet toilet paper or moist towelettes after a bowel movement.   Medicated creams and suppositories may be used or applied as directed.   Only take over-the-counter or prescription medicines as directed by your caregiver.   Take warm sitz baths for 15-20 minutes, 3-4 times a day to ease pain and discomfort.   Place ice packs on the hemorrhoids if they are tender and swollen. Using ice packs between sitz baths may be helpful.   Put ice in a plastic bag.   Place a towel between your skin and the bag.   Leave the ice on for 15-20 minutes, 3-4 times a day.   Do not use a donut-shaped pillow or sit on the toilet for long periods. This increases blood pooling and pain.  SEEK MEDICAL CARE IF:  You have increasing pain and swelling that is not controlled by treatment or medicine.  You have uncontrolled bleeding.  You have difficulty or you are unable to have a bowel movement.  You have pain or inflammation outside the area of the hemorrhoids. MAKE SURE YOU:  Understand these instructions.  Will watch your condition.  Will get help right away if you are not doing well or get worse.   This information is not intended to replace advice given to you by your health care provider. Make sure you discuss any questions you have with your health care provider.   Document Released: 02/28/2000 Document Revised: 02/17/2012 Document Reviewed:  01/05/2012 Elsevier Interactive Patient Education 2016 Goff a check on your blood pressure, if after your pain is  resolved from your hemorrhoid , and your blood pressure is still elevated check with your PCP

## 2015-06-14 NOTE — H&P (Signed)
History of Present Illness [name] T. Shelba Susi MD; 06/14/2015 5:24 PM) The patient is a 45 year old male who presents with hemorrhoids. Patient is referred urgently from [facility] due to acute hemorrhoid problems. The patient has a history of chronic constipation and actually saw Dr. Rosendo Gros here a few weeks ago. He states now his bowel movements have been regular. However he has intermittent swelling and bulging of hemorrhoids over several years that quickly improved. He states however that about 10 days ago his hemorrhoids protruded and has stayed out. Over the last several days they have gotten very painful and essentially intolerable with pain radiating into his lower abdomen and down into his thighs. He is unable to sit. Very uncomfortable. No previous history of hemorrhoid surgery.   Past Medical History  Diagnosis Date  . Hypertension     a. Hx of HTN urgency secondary to noncompliance. b. urinary metanephrine and catecholeamine levels normal 2013.  c. Renal art [name] 1/16:  No evidence of renal artery stenosis noted bilaterally.  . Anxiety   . GERD (gastroesophageal reflux disease)   . Chronic combined systolic and diastolic congestive heart failure ([place])     a. 05/2011: Adm with pulm edema/HTN urgency, EF 35-40% with diffuse hypokinesis and moderate to severe mitral regurgitation. Cardiomyopathy likely due to uncontrolled HTN and ETOH abuse - cath deferred due to renal insufficiency (felt due to uncontrolled HTN). bJodie Echevaria MV 06/2011: EF 37% and no ischemia or infarction. c. EF 45-50% by echo 01/2012.  . Cardiomyopathy ([place])     a. non-ischemic - probably related to untreated HTN and ETOH abuse - Echo 3/13 with EF 35-40% >> b. Echo 4/16: Severe LVH, EF 55-60%, moderate AI, moderate MR, mild LAE, trivial effusion, known type B dissection with communication between true and false lumens with suprasternal images suggesting dissection plane may propagate to at least left subclavian  takeoff, root above aortic valve okay    . HYPERLIPIDEMIA   . INGUINAL HERNIA   . ETOH abuse     a. Reported to have quit 05/2011.  . Valvular heart disease     a. Echo 05/2011: moderate to severe eccentric MR and mild to moderate AI with prolapsing left coronary cusp. b. Echo 01/2012: mild-mod AI, mild dilitation of aortic root, mild MR.;  c. Echo 1/16: Severe LVH consistent with hypertrophic cardio myopathy, EF 50%, no RWMA, mod AI, mild MR, mild RAE, dilated Ao root (40 mm);   . CKD (chronic kidney disease)     a. 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  peak creatinine 3.46 during admx for aortic dissection 2/16  . Tobacco abuse   . Pneumonia ~ 2013  . Headache(784.0)     "q other day" (08/08/2013)  . Chronic sinusitis   . History of medication noncompliance   . Chronic abdominal pain   . DDD (degenerative disc disease), lumbar   . Aortic dissection ([facility])     a. admx 04/2014 >> L renal infarct; a/c renal failure >> b.  s/p Bioprosthetic Bentall and total arch replacement and staged endovascular repair of descending aortic aneurysm ([facility] - Dr. [name])  . CAD (coronary artery disease)     a. LHC 4/16:  oD1 60%  . Renal insufficiency   . Aortic disease ([place])   . History of echocardiogram     Echo 1/17:  Severe LVH, EF 55-60%, no RWMA, Gr 2 DD, AVR ok, mild to mod MR, mild LAE, mild reduced RVSF, mod RAE   Past Surgical  History  Procedure Laterality Date  . Ankle surgery      Fractures bilaterally  . Inguinal hernia repair Right ~ 1996  . Foot fracture surgery Bilateral 2004-2010    "got pins in both of them"  . Left heart catheterization with coronary angiogram N/A 06/21/2014    Procedure: LEFT HEART CATHETERIZATION WITH CORONARY ANGIOGRAM;  Surgeon: Larey Dresser, MD;  Location: [facility] CATH LAB;  Service: Cardiovascular;  Laterality: N/A;  . Aortic valve surgery      Allergies Elbert Ewings, CMA; 06/14/2015 4:55 PM) Imdur *ANTIANGINAL AGENTS* TraMADol HCl *ANALGESICS - OPIOID*  Medication  History Elbert Ewings, CMA; 06/14/2015 4:55 PM) AmLODIPine Besylate (10MG  Tablet, Oral) Active. Atorvastatin Calcium (20MG  Tablet, Oral) Active. Labetalol HCl (300MG  Tablet, Oral) Active. Meclizine HCl (25MG  Tablet, Oral) Active. Pantoprazole Sodium (20MG  Tablet DR, Oral) Active. CloNIDine HCl (0.2MG  Tablet, Oral) Active. HydrALAZINE HCl (100MG  Tablet, Oral) Active. Aspirin (325MG  Tablet, Oral) Active. Medications Reconciled  Social History Edward Jolly, MD; 06/14/2015 5:24 PM) Caffeine use Coffee, Tea. Alcohol use Recently quit alcohol use. Tobacco use Current some day smoker.  Family History Edward Jolly, MD; 06/14/2015 5:24 PM) Migraine Headache Daughter. Hypertension Daughter.  Vitals Elbert Ewings CMA; 06/14/2015 4:55 PM) 06/14/2015 4:55 PM Weight: 150 lb Height: 71in Body Surface Area: 1.87 m Body Mass Index: 20.92 kg/m  Temp.: 97.77F  Pulse: 85 (Regular)  BP: 138/78 (Sitting, Left Arm, Standard)       Physical Exam [name] T. Kimmarie Pascale MD; 06/14/2015 5:25 PM) The physical exam findings are as follows: Note:General: Thin Afro-American male who is very uncomfortable lying on his side Skin: No rash or infection HEENT: No masses. Sclera nonicteric Lungs: Clear equal breath sounds and no increased work of breathing Cardiac: Heart systolic murmur with crisp valve click. Regular rate and rhythm. No edema. Abdomen: Soft and nontender Rectal: He has prolapsed and early necrotic internal hemorrhoids with marked tenderness. Neurologic: Alert and oriented. No focal weakness.    Assessment & Plan [name] T. Tobin Witucki MD; 06/14/2015 5:27 PM) COMPLEX FOURTH DEGREE HEMORRHOID (K64.3) Impression: He has prolapse and early necrosis of severe hemorrhoids. This will require hemorrhoidectomy under general anesthesia in the operating room. I discussed this with him. His cardiac status seems very stable. He is not on any chronic blood thinners. I  think this will need to be done at [facility] however due to his cardiac history. I discussed this with Dr. Barry Dienes who has agreed to try to get him on as an emergency tomorrow, Saturday.

## 2015-06-15 ENCOUNTER — Encounter (HOSPITAL_COMMUNITY): Payer: Self-pay | Admitting: *Deleted

## 2015-06-15 ENCOUNTER — Ambulatory Visit (HOSPITAL_COMMUNITY)
Admission: RE | Admit: 2015-06-15 | Discharge: 2015-06-15 | Disposition: A | Discharge status: Home / Self Care | Payer: Medicare Other | Source: Ambulatory Visit | Attending: General Surgery | Admitting: General Surgery

## 2015-06-15 ENCOUNTER — Encounter (HOSPITAL_COMMUNITY)
Admission: RE | Disposition: A | Discharge status: Home / Self Care | Payer: Self-pay | Source: Ambulatory Visit | Attending: General Surgery

## 2015-06-15 ENCOUNTER — Ambulatory Visit (HOSPITAL_COMMUNITY): Payer: Medicare Other | Admitting: Certified Registered Nurse Anesthetist

## 2015-06-15 DIAGNOSIS — E785 Hyperlipidemia, unspecified: Secondary | ICD-10-CM | POA: Insufficient documentation

## 2015-06-15 DIAGNOSIS — I2510 Atherosclerotic heart disease of native coronary artery without angina pectoris: Secondary | ICD-10-CM | POA: Insufficient documentation

## 2015-06-15 DIAGNOSIS — K644 Residual hemorrhoidal skin tags: Secondary | ICD-10-CM | POA: Diagnosis not present

## 2015-06-15 DIAGNOSIS — K648 Other hemorrhoids: Secondary | ICD-10-CM | POA: Diagnosis not present

## 2015-06-15 DIAGNOSIS — Z7982 Long term (current) use of aspirin: Secondary | ICD-10-CM | POA: Diagnosis not present

## 2015-06-15 DIAGNOSIS — I130 Hypertensive heart and chronic kidney disease with heart failure and stage 1 through stage 4 chronic kidney disease, or unspecified chronic kidney disease: Secondary | ICD-10-CM | POA: Insufficient documentation

## 2015-06-15 DIAGNOSIS — K643 Fourth degree hemorrhoids: Secondary | ICD-10-CM | POA: Insufficient documentation

## 2015-06-15 DIAGNOSIS — N189 Chronic kidney disease, unspecified: Secondary | ICD-10-CM | POA: Insufficient documentation

## 2015-06-15 DIAGNOSIS — K219 Gastro-esophageal reflux disease without esophagitis: Secondary | ICD-10-CM | POA: Insufficient documentation

## 2015-06-15 DIAGNOSIS — I5042 Chronic combined systolic (congestive) and diastolic (congestive) heart failure: Secondary | ICD-10-CM | POA: Insufficient documentation

## 2015-06-15 DIAGNOSIS — K645 Perianal venous thrombosis: Secondary | ICD-10-CM | POA: Diagnosis not present

## 2015-06-15 DIAGNOSIS — Z79899 Other long term (current) drug therapy: Secondary | ICD-10-CM | POA: Diagnosis not present

## 2015-06-15 DIAGNOSIS — F17200 Nicotine dependence, unspecified, uncomplicated: Secondary | ICD-10-CM | POA: Insufficient documentation

## 2015-06-15 DIAGNOSIS — K649 Unspecified hemorrhoids: Secondary | ICD-10-CM | POA: Diagnosis not present

## 2015-06-15 HISTORY — DX: Thoracic aortic aneurysm, without rupture, unspecified: I71.20

## 2015-06-15 HISTORY — DX: Cardiac murmur, unspecified: R01.1

## 2015-06-15 HISTORY — PX: HEMORRHOID SURGERY: SHX153

## 2015-06-15 HISTORY — DX: Perianal venous thrombosis: K64.5

## 2015-06-15 HISTORY — DX: Dissection of thoracic aorta, unspecified: I71.019

## 2015-06-15 HISTORY — DX: Dissection of thoracic aorta: I71.01

## 2015-06-15 HISTORY — DX: Thoracic aortic aneurysm, without rupture: I71.2

## 2015-06-15 HISTORY — DX: Aneurysm of the descending thoracic aorta, without rupture: I71.23

## 2015-06-15 SURGERY — HEMORRHOIDECTOMY
Anesthesia: General | Site: Rectum

## 2015-06-15 MED ORDER — OXYCODONE HCL 5 MG PO TABS: 5.0000 mg | ORAL_TABLET | ORAL | Status: DC | PRN

## 2015-06-15 MED ORDER — OXYCODONE HCL 5 MG PO TABS: 5.0000 mg | ORAL_TABLET | Freq: Once | ORAL | Status: DC | PRN

## 2015-06-15 MED ORDER — ACETAMINOPHEN 325 MG PO TABS: 650.0000 mg | ORAL_TABLET | ORAL | Status: DC | PRN

## 2015-06-15 MED ORDER — BUPIVACAINE-EPINEPHRINE (PF) 0.5% -1:200000 IJ SOLN
INTRAMUSCULAR | Status: AC
  Filled 2015-06-15: qty 30

## 2015-06-15 MED ORDER — LACTATED RINGERS IV SOLN
INTRAVENOUS | Status: DC
  Administered 2015-06-15: 08:00:00 via INTRAVENOUS

## 2015-06-15 MED ORDER — FENTANYL CITRATE (PF) 250 MCG/5ML IJ SOLN
INTRAMUSCULAR | Status: AC
  Filled 2015-06-15: qty 5

## 2015-06-15 MED ORDER — MIDAZOLAM HCL 5 MG/5ML IJ SOLN
INTRAMUSCULAR | Status: DC | PRN
  Administered 2015-06-15: 1 mg via INTRAVENOUS

## 2015-06-15 MED ORDER — PROPOFOL 10 MG/ML IV BOLUS
INTRAVENOUS | Status: DC | PRN
  Administered 2015-06-15: 20 mg via INTRAVENOUS
  Administered 2015-06-15: 180 mg via INTRAVENOUS

## 2015-06-15 MED ORDER — CEFAZOLIN SODIUM-DEXTROSE 2-3 GM-% IV SOLR
INTRAVENOUS | Status: DC | PRN
  Administered 2015-06-15: 2 g via INTRAVENOUS

## 2015-06-15 MED ORDER — SODIUM CHLORIDE 0.9% FLUSH: 3.0000 mL | INTRAVENOUS | Status: DC | PRN

## 2015-06-15 MED ORDER — HYDROMORPHONE HCL 1 MG/ML IJ SOLN
INTRAMUSCULAR | Status: AC
  Filled 2015-06-15: qty 1

## 2015-06-15 MED ORDER — PHENYLEPHRINE HCL 10 MG/ML IJ SOLN
INTRAMUSCULAR | Status: DC | PRN
  Administered 2015-06-15: 80 ug via INTRAVENOUS
  Administered 2015-06-15 (×4): 40 ug via INTRAVENOUS

## 2015-06-15 MED ORDER — BUPIVACAINE-EPINEPHRINE (PF) 0.5% -1:200000 IJ SOLN
INTRAMUSCULAR | Status: DC | PRN
  Administered 2015-06-15: 30 mL

## 2015-06-15 MED ORDER — ONDANSETRON HCL 4 MG/2ML IJ SOLN
4.0000 mg | Freq: Once | INTRAMUSCULAR | Status: AC
Start: 2015-06-15 — End: 2015-06-15
  Administered 2015-06-15: 4 mg via INTRAVENOUS
  Filled 2015-06-15: qty 2

## 2015-06-15 MED ORDER — LIDOCAINE HCL (PF) 1 % IJ SOLN
INTRAMUSCULAR | Status: AC
  Filled 2015-06-15: qty 30

## 2015-06-15 MED ORDER — DIBUCAINE 1 % EX OINT: TOPICAL_OINTMENT | Freq: Three times a day (TID) | CUTANEOUS | Status: DC | PRN

## 2015-06-15 MED ORDER — HYDROMORPHONE HCL 1 MG/ML IJ SOLN
0.2500 mg | INTRAMUSCULAR | Status: DC | PRN
  Administered 2015-06-15: 0.5 mg via INTRAVENOUS

## 2015-06-15 MED ORDER — LIDOCAINE HCL (CARDIAC) 20 MG/ML IV SOLN
INTRAVENOUS | Status: DC | PRN
  Administered 2015-06-15: 80 mg via INTRAVENOUS

## 2015-06-15 MED ORDER — FENTANYL CITRATE (PF) 100 MCG/2ML IJ SOLN
INTRAMUSCULAR | Status: DC | PRN
  Administered 2015-06-15: 50 ug via INTRAVENOUS
  Administered 2015-06-15: 25 ug via INTRAVENOUS
  Administered 2015-06-15: 50 ug via INTRAVENOUS
  Administered 2015-06-15: 25 ug via INTRAVENOUS

## 2015-06-15 MED ORDER — DIBUCAINE 1 % RE OINT
TOPICAL_OINTMENT | RECTAL | Status: AC
  Filled 2015-06-15: qty 28

## 2015-06-15 MED ORDER — ACETAMINOPHEN 650 MG RE SUPP: 650.0000 mg | RECTAL | Status: DC | PRN

## 2015-06-15 MED ORDER — PROPOFOL 10 MG/ML IV BOLUS
INTRAVENOUS | Status: AC
  Filled 2015-06-15: qty 40

## 2015-06-15 MED ORDER — DOCUSATE SODIUM 100 MG PO CAPS: 100.0000 mg | ORAL_CAPSULE | Freq: Two times a day (BID) | ORAL | Status: DC

## 2015-06-15 MED ORDER — DIBUCAINE 1 % RE OINT
TOPICAL_OINTMENT | RECTAL | Status: DC | PRN
Start: 2015-06-15 — End: 2015-06-15
  Administered 2015-06-15: 1 via RECTAL

## 2015-06-15 MED ORDER — HEMOSTATIC AGENTS (NO CHARGE) OPTIME
TOPICAL | Status: DC | PRN
  Administered 2015-06-15: 1 via TOPICAL

## 2015-06-15 MED ORDER — SODIUM CHLORIDE 0.9% FLUSH: 3.0000 mL | Freq: Two times a day (BID) | INTRAVENOUS | Status: DC

## 2015-06-15 MED ORDER — ONDANSETRON HCL 4 MG/2ML IJ SOLN: 4.0000 mg | Freq: Four times a day (QID) | INTRAMUSCULAR | Status: DC | PRN

## 2015-06-15 MED ORDER — OXYCODONE HCL 5 MG/5ML PO SOLN: 5.0000 mg | Freq: Once | ORAL | Status: DC | PRN

## 2015-06-15 MED ORDER — HYDROMORPHONE HCL 1 MG/ML IJ SOLN
INTRAMUSCULAR | Status: AC
  Administered 2015-06-15: 0.5 mg via INTRAVENOUS
  Filled 2015-06-15: qty 1

## 2015-06-15 MED ORDER — 0.9 % SODIUM CHLORIDE (POUR BTL) OPTIME
TOPICAL | Status: DC | PRN
  Administered 2015-06-15: 1000 mL

## 2015-06-15 MED ORDER — BUPIVACAINE LIPOSOME 1.3 % IJ SUSP
20.0000 mL | INTRAMUSCULAR | Status: DC
  Filled 2015-06-15: qty 20

## 2015-06-15 MED ORDER — MIDAZOLAM HCL 2 MG/2ML IJ SOLN
INTRAMUSCULAR | Status: AC
  Filled 2015-06-15: qty 2

## 2015-06-15 MED ORDER — ONDANSETRON HCL 4 MG/2ML IJ SOLN
INTRAMUSCULAR | Status: DC | PRN
  Administered 2015-06-15: 4 mg via INTRAVENOUS

## 2015-06-15 MED ORDER — SODIUM CHLORIDE 0.9 % IV SOLN: 250.0000 mL | INTRAVENOUS | Status: DC | PRN

## 2015-06-15 MED ORDER — BUPIVACAINE LIPOSOME 1.3 % IJ SUSP
INTRAMUSCULAR | Status: DC | PRN
  Administered 2015-06-15: 20 mL

## 2015-06-15 MED ORDER — BUPIVACAINE-EPINEPHRINE (PF) 0.25% -1:200000 IJ SOLN
INTRAMUSCULAR | Status: AC
  Filled 2015-06-15: qty 30

## 2015-06-15 SURGICAL SUPPLY — 37 items
CANISTER SUCTION 2500CC (MISCELLANEOUS) ×2 IMPLANT
COVER SURGICAL LIGHT HANDLE (MISCELLANEOUS) ×2 IMPLANT
DECANTER SPIKE VIAL GLASS SM (MISCELLANEOUS) ×4 IMPLANT
DRSG PAD ABDOMINAL 8X10 ST (GAUZE/BANDAGES/DRESSINGS) ×2 IMPLANT
ELECT CAUTERY BLADE 6.4 (BLADE) ×2 IMPLANT
ELECT REM PT RETURN 9FT ADLT (ELECTROSURGICAL) ×2
ELECTRODE REM PT RTRN 9FT ADLT (ELECTROSURGICAL) ×1 IMPLANT
GAUZE SPONGE 4X4 12PLY STRL (GAUZE/BANDAGES/DRESSINGS) ×2 IMPLANT
GLOVE BIO SURGEON STRL SZ 6 (GLOVE) ×2 IMPLANT
GLOVE BIOGEL PI IND STRL 6.5 (GLOVE) ×1 IMPLANT
GLOVE BIOGEL PI INDICATOR 6.5 (GLOVE) ×1
GOWN STRL REUS W/ TWL LRG LVL3 (GOWN DISPOSABLE) ×1 IMPLANT
GOWN STRL REUS W/TWL 2XL LVL3 (GOWN DISPOSABLE) ×2 IMPLANT
GOWN STRL REUS W/TWL LRG LVL3 (GOWN DISPOSABLE) ×2
KIT BASIN OR (CUSTOM PROCEDURE TRAY) ×2 IMPLANT
KIT ROOM TURNOVER OR (KITS) ×2 IMPLANT
NDL HYPO 25GX1X1/2 BEV (NEEDLE) ×1 IMPLANT
NEEDLE HYPO 25GX1X1/2 BEV (NEEDLE) ×2 IMPLANT
NS IRRIG 1000ML POUR BTL (IV SOLUTION) ×2 IMPLANT
PACK LITHOTOMY IV (CUSTOM PROCEDURE TRAY) ×2 IMPLANT
PAD ARMBOARD 7.5X6 YLW CONV (MISCELLANEOUS) ×2 IMPLANT
PENCIL BUTTON HOLSTER BLD 10FT (ELECTRODE) ×2 IMPLANT
SHEARS HARMONIC 9CM CVD (BLADE) ×1 IMPLANT
SPECIMEN JAR SMALL (MISCELLANEOUS) ×2 IMPLANT
SPONGE GAUZE 4X4 12PLY STER LF (GAUZE/BANDAGES/DRESSINGS) ×1 IMPLANT
SPONGE LAP 18X18 X RAY DECT (DISPOSABLE) ×2 IMPLANT
SPONGE SURGIFOAM ABS GEL 100 (HEMOSTASIS) ×2 IMPLANT
SURGILUBE 2OZ TUBE FLIPTOP (MISCELLANEOUS) ×2 IMPLANT
SUT CHROMIC 3 0 SH 27 (SUTURE) IMPLANT
SUT CHROMIC 4 0 SH 27 (SUTURE) ×4 IMPLANT
SYR CONTROL 10ML LL (SYRINGE) ×2 IMPLANT
TOWEL OR 17X24 6PK STRL BLUE (TOWEL DISPOSABLE) ×2 IMPLANT
TOWEL OR 17X26 10 PK STRL BLUE (TOWEL DISPOSABLE) ×2 IMPLANT
TRAY PROCTOSCOPIC FIBER OPTIC (SET/KITS/TRAYS/PACK) IMPLANT
TUBE CONNECTING 12X1/4 (SUCTIONS) ×2 IMPLANT
UNDERPAD 30X30 INCONTINENT (UNDERPADS AND DIAPERS) ×2 IMPLANT
YANKAUER SUCT BULB TIP NO VENT (SUCTIONS) ×2 IMPLANT

## 2015-06-15 NOTE — Interval H&P Note (Signed)
History and Physical Interval Note:  06/15/2015 7:31 AM  [name]  has presented today for surgery, with the diagnosis of THROMBOSED HEMORRHOIDS  The various methods of treatment have been discussed with the patient and family. After consideration of risks, benefits and other options for treatment, the patient has consented to  Procedure(s): HEMORRHOIDECTOMY (N/A) as a surgical intervention .  The patient's history has been reviewed, patient examined, no change in status, stable for surgery.  I have reviewed the patient's chart and labs.  Questions were answered to the patient's satisfaction.     Kavari Parrillo

## 2015-06-15 NOTE — Anesthesia Preprocedure Evaluation (Signed)
Anesthesia Evaluation  Patient identified by MRN, date of birth, ID band Patient awake    Reviewed: Allergy & Precautions, NPO status , Patient's Chart, lab work & pertinent test results  Airway Mallampati: II   Neck ROM: full    Dental   Pulmonary shortness of breath, Current Smoker,    breath sounds clear to auscultation       Cardiovascular hypertension, + Peripheral Vascular Disease and +CHF  + Valvular Problems/Murmurs AI and MR  Rhythm:regular Rate:Normal     Neuro/Psych  Headaches, Anxiety    GI/Hepatic GERD  ,  Endo/Other    Renal/GU Renal InsufficiencyRenal disease     Musculoskeletal  (+) Arthritis ,   Abdominal   Peds  Hematology   Anesthesia Other Findings   Reproductive/Obstetrics                             Anesthesia Physical Anesthesia Plan  ASA: III  Anesthesia Plan: General   Post-op Pain Management:    Induction: Intravenous  Airway Management Planned: LMA  Additional Equipment:   Intra-op Plan:   Post-operative Plan:   Informed Consent: I have reviewed the patients History and Physical, chart, labs and discussed the procedure including the risks, benefits and alternatives for the proposed anesthesia with the patient or authorized representative who has indicated his/her understanding and acceptance.     Plan Discussed with: CRNA, Anesthesiologist and Surgeon  Anesthesia Plan Comments:         Anesthesia Quick Evaluation

## 2015-06-15 NOTE — H&P (View-Only) (Signed)
History of Present Illness [name] T. Andre Olazabal MD; 06/14/2015 5:24 PM) The patient is a 45 year old male who presents with hemorrhoids. Patient is referred urgently from [facility] due to acute hemorrhoid problems. The patient has a history of chronic constipation and actually saw Dr. Rosendo Gros here a few weeks ago. He states now his bowel movements have been regular. However he has intermittent swelling and bulging of hemorrhoids over several years that quickly improved. He states however that about 10 days ago his hemorrhoids protruded and has stayed out. Over the last several days they have gotten very painful and essentially intolerable with pain radiating into his lower abdomen and down into his thighs. He is unable to sit. Very uncomfortable. No previous history of hemorrhoid surgery.   Past Medical History  Diagnosis Date  . Hypertension     a. Hx of HTN urgency secondary to noncompliance. b. urinary metanephrine and catecholeamine levels normal 2013.  c. Renal art [name] 1/16:  No evidence of renal artery stenosis noted bilaterally.  . Anxiety   . GERD (gastroesophageal reflux disease)   . Chronic combined systolic and diastolic congestive heart failure ([place])     a. 05/2011: Adm with pulm edema/HTN urgency, EF 35-40% with diffuse hypokinesis and moderate to severe mitral regurgitation. Cardiomyopathy likely due to uncontrolled HTN and ETOH abuse - cath deferred due to renal insufficiency (felt due to uncontrolled HTN). bJodie Echevaria MV 06/2011: EF 37% and no ischemia or infarction. c. EF 45-50% by echo 01/2012.  . Cardiomyopathy ([place])     a. non-ischemic - probably related to untreated HTN and ETOH abuse - Echo 3/13 with EF 35-40% >> b. Echo 4/16: Severe LVH, EF 55-60%, moderate AI, moderate MR, mild LAE, trivial effusion, known type B dissection with communication between true and false lumens with suprasternal images suggesting dissection plane may propagate to at least left subclavian  takeoff, root above aortic valve okay    . HYPERLIPIDEMIA   . INGUINAL HERNIA   . ETOH abuse     a. Reported to have quit 05/2011.  . Valvular heart disease     a. Echo 05/2011: moderate to severe eccentric MR and mild to moderate AI with prolapsing left coronary cusp. b. Echo 01/2012: mild-mod AI, mild dilitation of aortic root, mild MR.;  c. Echo 1/16: Severe LVH consistent with hypertrophic cardio myopathy, EF 50%, no RWMA, mod AI, mild MR, mild RAE, dilated Ao root (40 mm);   . CKD (chronic kidney disease)     a. 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  peak creatinine 3.46 during admx for aortic dissection 2/16  . Tobacco abuse   . Pneumonia ~ 2013  . Headache(784.0)     "q other day" (08/08/2013)  . Chronic sinusitis   . History of medication noncompliance   . Chronic abdominal pain   . DDD (degenerative disc disease), lumbar   . Aortic dissection (H[facility])     a. admx 04/2014 >> L renal infarct; a/c renal failure >> b.  s/p Bioprosthetic Bentall and total arch replacement and staged endovascular repair of descending aortic aneurysm ([facility] - Dr. Ysidro Ewing)  . CAD (coronary artery disease)     a. LHC 4/16:  oD1 60%  . Renal insufficiency   . Aortic disease ([place])   . History of echocardiogram     Echo 1/17:  Severe LVH, EF 55-60%, no RWMA, Gr 2 DD, AVR ok, mild to mod MR, mild LAE, mild reduced RVSF, mod RAE   Past Surgical  History  Procedure Laterality Date  . Ankle surgery      Fractures bilaterally  . Inguinal hernia repair Right ~ 1996  . Foot fracture surgery Bilateral 2004-2010    "got pins in both of them"  . Left heart catheterization with coronary angiogram N/A 06/21/2014    Procedure: LEFT HEART CATHETERIZATION WITH CORONARY ANGIOGRAM;  Surgeon: Larey Dresser, MD;  Location: [facility] CATH LAB;  Service: Cardiovascular;  Laterality: N/A;  . Aortic valve surgery      Allergies Elbert Ewings, CMA; 06/14/2015 4:55 PM) Imdur *ANTIANGINAL AGENTS* TraMADol HCl *ANALGESICS - OPIOID*  Medication  History Elbert Ewings, CMA; 06/14/2015 4:55 PM) AmLODIPine Besylate (10MG  Tablet, Oral) Active. Atorvastatin Calcium (20MG  Tablet, Oral) Active. Labetalol HCl (300MG  Tablet, Oral) Active. Meclizine HCl (25MG  Tablet, Oral) Active. Pantoprazole Sodium (20MG  Tablet DR, Oral) Active. CloNIDine HCl (0.2MG  Tablet, Oral) Active. HydrALAZINE HCl (100MG  Tablet, Oral) Active. Aspirin (325MG  Tablet, Oral) Active. Medications Reconciled  Social History Andre Jolly, MD; 06/14/2015 5:24 PM) Caffeine use Coffee, Tea. Alcohol use Recently quit alcohol use. Tobacco use Current some day smoker.  Family History Andre Jolly, MD; 06/14/2015 5:24 PM) Migraine Headache Daughter. Hypertension Daughter.  Vitals Elbert Ewings CMA; 06/14/2015 4:55 PM) 06/14/2015 4:55 PM Weight: 150 lb Height: 71in Body Surface Area: 1.87 m Body Mass Index: 20.92 kg/m  Temp.: 97.63F  Pulse: 85 (Regular)  BP: 138/78 (Sitting, Left Arm, Standard)       Physical Exam [name] T. Alberto Pina MD; 06/14/2015 5:25 PM) The physical exam findings are as follows: Note:General: Thin Afro-American male who is very uncomfortable lying on his side Skin: No rash or infection HEENT: No masses. Sclera nonicteric Lungs: Clear equal breath sounds and no increased work of breathing Cardiac: Heart systolic murmur with crisp valve click. Regular rate and rhythm. No edema. Abdomen: Soft and nontender Rectal: He has prolapsed and early necrotic internal hemorrhoids with marked tenderness. Neurologic: Alert and oriented. No focal weakness.    Assessment & Plan [name] T. Andre Bakos MD; 06/14/2015 5:27 PM) COMPLEX FOURTH DEGREE HEMORRHOID (K64.3) Impression: He has prolapse and early necrosis of severe hemorrhoids. This will require hemorrhoidectomy under general anesthesia in the operating room. I discussed this with him. His cardiac status seems very stable. He is not on any chronic blood thinners. I  think this will need to be done at [facility] however due to his cardiac history. I discussed this with Dr. Barry Dienes who has agreed to try to get him on as an emergency tomorrow, Saturday.

## 2015-06-15 NOTE — Discharge Instructions (Signed)
[facility] _______[facility]  RECTAL SURGERY POST OP INSTRUCTIONS: POST OP INSTRUCTIONS  Always review your discharge instruction sheet given to you by the facility where your surgery was performed. IF YOU HAVE DISABILITY OR FAMILY LEAVE FORMS, YOU MUST BRING THEM TO THE OFFICE FOR PROCESSING.   DO NOT GIVE THEM TO YOUR DOCTOR.  1. A  prescription for pain medication may be given to you upon discharge.  Take your pain medication as prescribed, if needed.  If narcotic pain medicine is not needed, then you may take acetaminophen (Tylenol) or ibuprofen (Advil) as needed. 2. Take your usually prescribed medications unless otherwise directed. 3. If you need a refill on your pain medication, please contact your pharmacy.  They will contact our office to request authorization. Prescriptions will not be filled after 5 pm or on week-ends. 4. You should follow a liquid diet the first 2-3 days after arrival home, such as soup and crackers, smoothies, etc.  Be sure to include lots of fluids daily.  Resume a normal high fiber diet 2-3 days after surgery. 5. Most patients will experience some swelling and discomfort in the rectal area. Ice packs, reclining and warm tub soaks will help.  You can do warm water soaks up to 6 times per day for 20 min.  DO NOT ADD SALT OR OILS to the water.  It is especially helpful to do after bowel movements.  Swelling and discomfort can take several weeks to resolve.  6. You may pass a white cylinder after surgery.  This is a matrix to help prevent bleeding.  It will usually dissolve on its own, but do not be alarmed if you pass it.  Also, there is numbing ointment on it.   7. It is common to experience some constipation if taking pain medication after surgery.  Increasing fluid intake and taking a stool softener (such as Colace) will usually help or prevent this problem from occurring.  A mild laxative (Milk of Magnesia or Miralax) should be taken according to package  directions if there are no bowel movements after 48 hours. 8. Unless discharge instructions indicate otherwise, leave your bandage dry and in place for 24 hours, or remove the bandage if you have a bowel movement. You may notice a small amount of bleeding with bowel movements for the first few days. You may have some packing in the rectum which will come out over the first day or two. You will need to wear an absorbent pad or soft cotton gauze in your underwear until the drainage stops.it. 9. ACTIVITIES:  You may resume regular (light) daily activities beginning the next day--such as daily self-care, walking, climbing stairs--gradually increasing activities as tolerated.  You may have sexual intercourse when it is comfortable.  Refrain from any heavy lifting or straining until approved by your doctor. a. You may drive when you are no longer taking prescription pain medication, you can comfortably wear a seatbelt, and you can safely maneuver your car and apply brakes. b. RETURN TO WORK: : _____as tolerated as long as no heavy lifting or straining for 1 week.  ______________ c.  10. You should see your doctor in the office for a follow-up appointment approximately 2-3 weeks after your surgery.  Make sure that you call for this appointment within a day or two after you arrive home to insure a convenient appointment time. 11. OTHER INSTRUCTIONS:  __________________________________________________________________________________________________________________________________________________________________________________________  WHEN TO CALL YOUR DOCTOR: 1. Fever over 101.0 2. Inability to urinate 3.  Nausea and/or vomiting 4. Extreme swelling or bruising 5. Continued bleeding from rectum. 6. Increased pain, redness, or drainage from the incision 7. Constipation  The clinic staff is available to answer your questions during regular business hours.  Please dont hesitate to call and ask to speak to one  of the nurses for clinical concerns.  If you have a medical emergency, go to the nearest emergency room or call 911.  A surgeon from [facility] is always on call at the hospital   [address], [place], [place], [place]  [address] ?  [address], [place],    [address] [phone] ? [phone] ? FAX [phone] Web site: [email or web address]

## 2015-06-15 NOTE — Anesthesia Postprocedure Evaluation (Signed)
Anesthesia Post Note  Patient: [name]  Procedure(s) Performed: Procedure(s) (LRB): HEMORRHOIDECTOMY (N/A)  Patient location during evaluation: PACU Anesthesia Type: General Level of consciousness: awake and alert and patient cooperative Pain management: pain level controlled Vital Signs Assessment: post-procedure vital signs reviewed and stable Respiratory status: spontaneous breathing and respiratory function stable Cardiovascular status: stable Anesthetic complications: no    Last Vitals:  Filed Vitals:   06/15/15 1200 06/15/15 1215  BP:    Pulse: 67 65  Temp:    Resp: 17 13    Last Pain:  Filed Vitals:   06/15/15 1235  PainSc: [place]

## 2015-06-15 NOTE — Transfer of Care (Signed)
Immediate Anesthesia Transfer of Care Note  Patient: [name]  Procedure(s) Performed: Procedure(s): HEMORRHOIDECTOMY (N/A)  Patient Location: PACU  Anesthesia Type:General  Level of Consciousness: sedated and patient cooperative  Airway & Oxygen Therapy: Patient Spontanous Breathing and Patient connected to face mask oxygen  Post-op Assessment: Report given to RN and Post -op Vital signs reviewed and stable  Post vital signs: Reviewed and stable  Last Vitals:  Filed Vitals:   06/15/15 0621 06/15/15 1103  BP: 169/89   Pulse: 56 60  Temp: 36.7 C 36.3 C  Resp: 16 20    Complications: No apparent anesthesia complications

## 2015-06-15 NOTE — Progress Notes (Signed)
Pt is oob to bathroom, pt reports significant feeling of pressure in rectum, have completed extensive education with patient about local anesthetic, general anesthetic, pain medications, and importance of not straining.  Education also completed with wife.  Pt. oob to bathroom at this time, but will only try for bowel movement for short amount of time.  Will cont to assess and treat for pain (pressure) as needed.

## 2015-06-15 NOTE — Progress Notes (Signed)
Patient reports that nausea is relieved.

## 2015-06-15 NOTE — Anesthesia Procedure Notes (Signed)
Procedure Name: LMA Insertion Date/Time: 06/15/2015 10:24 AM Performed by: Merdis Delay Pre-anesthesia Checklist: Patient identified, Emergency Drugs available, Suction available, Patient being monitored and Timeout performed Patient Re-evaluated:Patient Re-evaluated prior to inductionOxygen Delivery Method: Circle system utilized Preoxygenation: Pre-oxygenation with 100% oxygen Intubation Type: IV induction LMA: LMA inserted LMA Size: 5.0 Number of attempts: 1 Placement Confirmation: positive ETCO2,  CO2 detector and breath sounds checked- equal and bilateral Tube secured with: Tape Dental Injury: Teeth and Oropharynx as per pre-operative assessment

## 2015-06-15 NOTE — Op Note (Signed)
PRE-OPERATIVE DIAGNOSIS: thrombosed, prolapsed, necrotic hemorrhoids  POST-OPERATIVE DIAGNOSIS:  Same  PROCEDURE:  Procedure(s): Excision of prolapsed, thrombosed internal hemorrhoids x 3 and excision of large external hemorrhoids x 2.    SURGEON:  Surgeon(s): Stark Klein, MD  ANESTHESIA:   local, general and exparel  DRAINS: none   LOCAL MEDICATIONS USED:  BUPIVICAINE  and OTHER exparel  SPECIMEN:  Source of Specimen:  hemorrhoids  DISPOSITION OF SPECIMEN:  PATHOLOGY  COUNTS:  YES  DICTATION: .Dragon Dictation  PLAN OF CARE: Discharge to home after PACU  PATIENT DISPOSITION:  PACU - hemodynamically stable.  FINDINGS:  Large thrombosed prolapsed internal hemorrhoids.  Posterior left, posterior right, and right lateral.  Large circumferential external hemorrhoids.    EBL: min  PROCEDURE:    Patient was identified in the holding area and taken to the operating room where he was placed supine on the operating room table. General anesthesia was induced. The patient was then placed in the low lithotomy position. The perineum and anus were prepped and draped in standard fashion with betadine.  Timeout was performed according to the surgical safety checklist.  Patient was placed into trendelenburg position and digital exam was performed.  The thrombosed hemorrhoids were prolapsed and firm.  Exparel was infiltrated around the anus.  The thrombosed hemorrhoids were clamped and removed with the harmonic scalpel.  The mucosal defects were closed with running 4-0 chromic sutures.  Externally, two posterior very large hemorrhoids were removed with the harmonic as well.  These were contiguous with two of the internal hemorrhoids.  Both were posterolateral.  These were also closed with 4-0 chromic running suture.  Additional local anesthetic was infiltrated.    Gelfoam was then placed into the anus with dibucaine ointment.  The perineum was then cleaned, dried, and dressed with ABDs and mesh  underwear.    The patient was allowed to emerge from anesthesia and was taken to the PACU in stable condition.  Needle, sponge, and instrument counts were correct times 2.

## 2015-06-17 ENCOUNTER — Encounter (HOSPITAL_COMMUNITY): Payer: Self-pay | Admitting: General Surgery

## 2015-06-20 ENCOUNTER — Telehealth: Payer: Self-pay | Admitting: Gastroenterology

## 2015-06-20 NOTE — Telephone Encounter (Signed)
Patient had hemorrhoid surgery by Dr. Barry Dienes on Saturday.  Patient is advised to call Dr. Barry Dienes .  I have provided him the number to her office.  He is asked to contact Dr. Marlowe Aschoff office for directions.

## 2015-06-21 ENCOUNTER — Telehealth: Payer: Self-pay

## 2015-06-21 NOTE — Telephone Encounter (Signed)
[facility] contacted regarding timing of colonoscopy following hemorrhoid surgery.  Patient is scheduled for colonoscopy on 06/26/15, but he had hemorrhoidectomy by Dr. Barry Dienes on 06/15/15.  I spoke with triage nurse and they will call back next week once Dr. Barry Dienes returns to the office.

## 2015-06-24 NOTE — Telephone Encounter (Signed)
Per Abigail Butts at [employer].  Dr. Barry Dienes would like to have colonoscopy rescheduled for 4-6 weeks.  Patient rescheduled to 08/22/15 8:00.

## 2015-06-26 ENCOUNTER — Encounter: Payer: Medicare Other | Admitting: Gastroenterology
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
## 2015-08-03 DIAGNOSIS — R634 Abnormal weight loss: Secondary | ICD-10-CM | POA: Diagnosis not present

## 2015-08-03 DIAGNOSIS — E785 Hyperlipidemia, unspecified: Secondary | ICD-10-CM | POA: Diagnosis not present

## 2015-08-03 DIAGNOSIS — N189 Chronic kidney disease, unspecified: Secondary | ICD-10-CM | POA: Diagnosis not present

## 2015-08-03 DIAGNOSIS — E559 Vitamin D deficiency, unspecified: Secondary | ICD-10-CM | POA: Diagnosis not present

## 2015-08-03 DIAGNOSIS — I10 Essential (primary) hypertension: Secondary | ICD-10-CM | POA: Diagnosis not present

## 2015-08-15 DIAGNOSIS — D126 Benign neoplasm of colon, unspecified: Secondary | ICD-10-CM

## 2015-08-15 HISTORY — DX: Benign neoplasm of colon, unspecified: D12.6

## 2015-08-16 DIAGNOSIS — I7100 Dissection of unspecified site of aorta: Secondary | ICD-10-CM | POA: Diagnosis not present

## 2015-08-16 DIAGNOSIS — D649 Anemia, unspecified: Secondary | ICD-10-CM | POA: Diagnosis not present

## 2015-08-16 DIAGNOSIS — Z9889 Other specified postprocedural states: Secondary | ICD-10-CM | POA: Diagnosis not present

## 2015-08-16 DIAGNOSIS — Z720 Tobacco use: Secondary | ICD-10-CM | POA: Diagnosis not present

## 2015-08-16 DIAGNOSIS — Z8679 Personal history of other diseases of the circulatory system: Secondary | ICD-10-CM | POA: Diagnosis not present

## 2015-08-16 DIAGNOSIS — N280 Ischemia and infarction of kidney: Secondary | ICD-10-CM | POA: Diagnosis not present

## 2015-08-16 DIAGNOSIS — I10 Essential (primary) hypertension: Secondary | ICD-10-CM | POA: Diagnosis not present

## 2015-08-16 DIAGNOSIS — N183 Chronic kidney disease, stage 3 (moderate): Secondary | ICD-10-CM | POA: Diagnosis not present

## 2015-08-22 ENCOUNTER — Ambulatory Visit: Payer: Medicare Other | Admitting: Gastroenterology

## 2015-08-22 ENCOUNTER — Encounter: Payer: Self-pay | Admitting: Gastroenterology

## 2015-08-22 VITALS — BP 88/53 | HR 71 | Ht 71.0 in | Wt 148.0 lb

## 2015-08-22 MED ORDER — SODIUM CHLORIDE 0.9 % IV SOLN: 500.0000 mL | INTRAVENOUS | Status: DC

## 2015-08-22 NOTE — Progress Notes (Signed)
Procedure cancelled due to patient not taking prep. Stated they called him yesterday to tell him to come in today but not tell him to pick up a prep.

## 2015-08-28 ENCOUNTER — Ambulatory Visit (AMBULATORY_SURGERY_CENTER): Payer: Self-pay | Admitting: *Deleted

## 2015-08-28 VITALS — Ht 71.0 in | Wt 152.0 lb

## 2015-08-28 DIAGNOSIS — E785 Hyperlipidemia, unspecified: Secondary | ICD-10-CM | POA: Insufficient documentation

## 2015-08-28 DIAGNOSIS — E782 Mixed hyperlipidemia: Secondary | ICD-10-CM | POA: Insufficient documentation

## 2015-08-28 DIAGNOSIS — K625 Hemorrhage of anus and rectum: Secondary | ICD-10-CM

## 2015-08-28 MED ORDER — NA SULFATE-K SULFATE-MG SULF 17.5-3.13-1.6 GM/177ML PO SOLN: ORAL | Status: DC

## 2015-08-28 NOTE — Progress Notes (Signed)
Wife at side during pv today. Patient denies any allergies to eggs or soy. Patient denies any problems with anesthesia/sedation. Patient denies any oxygen use at home and does not take any diet/weight loss medications. Notified patient to hold Iron pill 5 days prior to procedure.

## 2015-09-07 ENCOUNTER — Other Ambulatory Visit: Payer: Self-pay

## 2015-09-07 ENCOUNTER — Emergency Department (HOSPITAL_COMMUNITY): Payer: Medicare Other

## 2015-09-07 ENCOUNTER — Inpatient Hospital Stay (HOSPITAL_COMMUNITY)
Admission: EM | Admit: 2015-09-07 | Discharge: 2015-09-09 | DRG: 305 | Disposition: A | Discharge status: Home / Self Care | Payer: Medicare Other | Attending: Emergency Medicine | Admitting: Emergency Medicine

## 2015-09-07 ENCOUNTER — Encounter (HOSPITAL_COMMUNITY): Payer: Self-pay

## 2015-09-07 DIAGNOSIS — I2510 Atherosclerotic heart disease of native coronary artery without angina pectoris: Secondary | ICD-10-CM | POA: Diagnosis present

## 2015-09-07 DIAGNOSIS — Z9114 Patient's other noncompliance with medication regimen: Secondary | ICD-10-CM

## 2015-09-07 DIAGNOSIS — Z7982 Long term (current) use of aspirin: Secondary | ICD-10-CM

## 2015-09-07 DIAGNOSIS — Z8249 Family history of ischemic heart disease and other diseases of the circulatory system: Secondary | ICD-10-CM

## 2015-09-07 DIAGNOSIS — Z8679 Personal history of other diseases of the circulatory system: Secondary | ICD-10-CM

## 2015-09-07 DIAGNOSIS — K219 Gastro-esophageal reflux disease without esophagitis: Secondary | ICD-10-CM | POA: Diagnosis present

## 2015-09-07 DIAGNOSIS — Z79899 Other long term (current) drug therapy: Secondary | ICD-10-CM

## 2015-09-07 DIAGNOSIS — F17210 Nicotine dependence, cigarettes, uncomplicated: Secondary | ICD-10-CM | POA: Diagnosis present

## 2015-09-07 DIAGNOSIS — I5042 Chronic combined systolic (congestive) and diastolic (congestive) heart failure: Secondary | ICD-10-CM | POA: Diagnosis present

## 2015-09-07 DIAGNOSIS — I161 Hypertensive emergency: Secondary | ICD-10-CM | POA: Diagnosis present

## 2015-09-07 DIAGNOSIS — R0789 Other chest pain: Secondary | ICD-10-CM

## 2015-09-07 DIAGNOSIS — I340 Nonrheumatic mitral (valve) insufficiency: Secondary | ICD-10-CM | POA: Diagnosis present

## 2015-09-07 DIAGNOSIS — I428 Other cardiomyopathies: Secondary | ICD-10-CM | POA: Diagnosis present

## 2015-09-07 DIAGNOSIS — G8929 Other chronic pain: Secondary | ICD-10-CM | POA: Diagnosis present

## 2015-09-07 DIAGNOSIS — N182 Chronic kidney disease, stage 2 (mild): Secondary | ICD-10-CM | POA: Diagnosis present

## 2015-09-07 DIAGNOSIS — Z823 Family history of stroke: Secondary | ICD-10-CM

## 2015-09-07 DIAGNOSIS — M549 Dorsalgia, unspecified: Secondary | ICD-10-CM | POA: Diagnosis present

## 2015-09-07 DIAGNOSIS — E876 Hypokalemia: Secondary | ICD-10-CM | POA: Diagnosis present

## 2015-09-07 DIAGNOSIS — F419 Anxiety disorder, unspecified: Secondary | ICD-10-CM | POA: Diagnosis present

## 2015-09-07 DIAGNOSIS — Z9119 Patient's noncompliance with other medical treatment and regimen: Secondary | ICD-10-CM | POA: Diagnosis not present

## 2015-09-07 DIAGNOSIS — J329 Chronic sinusitis, unspecified: Secondary | ICD-10-CM | POA: Diagnosis present

## 2015-09-07 DIAGNOSIS — Z888 Allergy status to other drugs, medicaments and biological substances status: Secondary | ICD-10-CM | POA: Diagnosis not present

## 2015-09-07 DIAGNOSIS — R079 Chest pain, unspecified: Secondary | ICD-10-CM | POA: Diagnosis not present

## 2015-09-07 DIAGNOSIS — E785 Hyperlipidemia, unspecified: Secondary | ICD-10-CM | POA: Diagnosis present

## 2015-09-07 DIAGNOSIS — I10 Essential (primary) hypertension: Secondary | ICD-10-CM | POA: Diagnosis not present

## 2015-09-07 DIAGNOSIS — R0602 Shortness of breath: Secondary | ICD-10-CM | POA: Diagnosis not present

## 2015-09-07 DIAGNOSIS — D696 Thrombocytopenia, unspecified: Secondary | ICD-10-CM | POA: Diagnosis present

## 2015-09-07 DIAGNOSIS — I130 Hypertensive heart and chronic kidney disease with heart failure and stage 1 through stage 4 chronic kidney disease, or unspecified chronic kidney disease: Secondary | ICD-10-CM | POA: Diagnosis present

## 2015-09-07 DIAGNOSIS — R7989 Other specified abnormal findings of blood chemistry: Secondary | ICD-10-CM | POA: Diagnosis not present

## 2015-09-07 DIAGNOSIS — R1032 Left lower quadrant pain: Secondary | ICD-10-CM | POA: Diagnosis not present

## 2015-09-07 LAB — RAPID URINE DRUG SCREEN, HOSP PERFORMED
AMPHETAMINES: NOT DETECTED
Barbiturates: NOT DETECTED
Benzodiazepines: NOT DETECTED
Cocaine: NOT DETECTED
OPIATES: POSITIVE — AB
TETRAHYDROCANNABINOL: POSITIVE — AB

## 2015-09-07 LAB — I-STAT TROPONIN, ED
Troponin i, poc: 0.17 ng/mL (ref 0.00–0.08)
Troponin i, poc: 0.15 ng/mL (ref 0.00–0.08)

## 2015-09-07 LAB — BASIC METABOLIC PANEL
ANION GAP: 8 (ref 5–15)
BUN: 11 mg/dL (ref 6–20)
CALCIUM: 9.6 mg/dL (ref 8.9–10.3)
CO2: 25 mmol/L (ref 22–32)
Chloride: 106 mmol/L (ref 101–111)
Creatinine, Ser: 1.41 mg/dL — ABNORMAL HIGH (ref 0.61–1.24)
GFR calc non Af Amer: 60 mL/min — ABNORMAL LOW (ref >60)
Glucose, Bld: 84 mg/dL (ref 65–99)
POTASSIUM: 3.8 mmol/L (ref 3.5–5.1)
Sodium: 139 mmol/L (ref 135–145)

## 2015-09-07 LAB — CBC
HCT: 41.3 % (ref 39.0–52.0)
Hemoglobin: 13.2 g/dL (ref 13.0–17.0)
MCH: 27.9 pg (ref 26.0–34.0)
MCHC: 32 g/dL (ref 30.0–36.0)
MCV: 87.3 fL (ref 78.0–100.0)
PLATELETS: 111 10*3/uL — AB (ref 150–400)
RBC: 4.73 MIL/uL (ref 4.22–5.81)
RDW: 16.8 % — AB (ref 11.5–15.5)
WBC: 4.9 10*3/uL (ref 4.0–10.5)

## 2015-09-07 LAB — MRSA PCR SCREENING: MRSA by PCR: POSITIVE — AB

## 2015-09-07 MED ORDER — MUPIROCIN 2 % EX OINT
1.0000 "application " | TOPICAL_OINTMENT | Freq: Two times a day (BID) | CUTANEOUS | Status: DC
  Administered 2015-09-07 – 2015-09-08 (×3): 1 via NASAL
  Filled 2015-09-07: qty 22

## 2015-09-07 MED ORDER — MORPHINE SULFATE (PF) 2 MG/ML IV SOLN
2.0000 mg | Freq: Once | INTRAVENOUS | Status: AC
  Administered 2015-09-07: 2 mg via INTRAVENOUS
  Filled 2015-09-07: qty 1

## 2015-09-07 MED ORDER — HYDROMORPHONE HCL 1 MG/ML IJ SOLN
1.0000 mg | Freq: Once | INTRAMUSCULAR | Status: AC
  Administered 2015-09-07: 1 mg via INTRAVENOUS
  Filled 2015-09-07: qty 1

## 2015-09-07 MED ORDER — HEPARIN SODIUM (PORCINE) 5000 UNIT/ML IJ SOLN
5000.0000 [IU] | Freq: Three times a day (TID) | INTRAMUSCULAR | Status: DC
  Administered 2015-09-07 – 2015-09-09 (×5): 5000 [IU] via SUBCUTANEOUS
  Filled 2015-09-07 (×6): qty 1

## 2015-09-07 MED ORDER — HYDRALAZINE HCL 50 MG PO TABS
50.0000 mg | ORAL_TABLET | Freq: Two times a day (BID) | ORAL | Status: DC
  Administered 2015-09-07 – 2015-09-09 (×4): 50 mg via ORAL
  Filled 2015-09-07 (×4): qty 1

## 2015-09-07 MED ORDER — FENTANYL CITRATE (PF) 100 MCG/2ML IJ SOLN
25.0000 ug | INTRAMUSCULAR | Status: DC | PRN
  Administered 2015-09-07: 25 ug via INTRAVENOUS
  Administered 2015-09-08 (×4): 50 ug via INTRAVENOUS
  Filled 2015-09-07 (×5): qty 2

## 2015-09-07 MED ORDER — LABETALOL HCL 5 MG/ML IV SOLN
2.0000 mg/min | INTRAVENOUS | Status: DC
  Administered 2015-09-07: 2 mg/min via INTRAVENOUS
  Filled 2015-09-07 (×2): qty 100

## 2015-09-07 MED ORDER — IOPAMIDOL (ISOVUE-370) INJECTION 76%
INTRAVENOUS | Status: AC
  Administered 2015-09-07: 100 mL
  Filled 2015-09-07: qty 100

## 2015-09-07 MED ORDER — NICARDIPINE HCL IN NACL 20-0.86 MG/200ML-% IV SOLN
3.0000 mg/h | INTRAVENOUS | Status: DC
  Administered 2015-09-07: 3.5 mg/h via INTRAVENOUS
  Administered 2015-09-07: 5 mg/h via INTRAVENOUS
  Filled 2015-09-07 (×2): qty 200

## 2015-09-07 MED ORDER — ASPIRIN 81 MG PO CHEW
243.0000 mg | CHEWABLE_TABLET | Freq: Once | ORAL | Status: AC
  Administered 2015-09-07: 243 mg via ORAL
  Filled 2015-09-07: qty 3

## 2015-09-07 MED ORDER — LABETALOL HCL 300 MG PO TABS
600.0000 mg | ORAL_TABLET | Freq: Two times a day (BID) | ORAL | Status: DC
  Administered 2015-09-07 – 2015-09-09 (×4): 600 mg via ORAL
  Filled 2015-09-07 (×4): qty 2

## 2015-09-07 MED ORDER — MORPHINE SULFATE (PF) 4 MG/ML IV SOLN
4.0000 mg | Freq: Once | INTRAVENOUS | Status: AC
  Administered 2015-09-07: 4 mg via INTRAVENOUS
  Filled 2015-09-07: qty 1

## 2015-09-07 MED ORDER — PANTOPRAZOLE SODIUM 40 MG PO TBEC
40.0000 mg | DELAYED_RELEASE_TABLET | Freq: Every day | ORAL | Status: DC
  Administered 2015-09-08 – 2015-09-09 (×2): 40 mg via ORAL
  Filled 2015-09-07 (×2): qty 1

## 2015-09-07 MED ORDER — CLONIDINE HCL 0.2 MG PO TABS
0.2000 mg | ORAL_TABLET | Freq: Three times a day (TID) | ORAL | Status: DC
  Administered 2015-09-07 – 2015-09-09 (×6): 0.2 mg via ORAL
  Filled 2015-09-07 (×6): qty 1

## 2015-09-07 MED ORDER — CHLORHEXIDINE GLUCONATE CLOTH 2 % EX PADS
6.0000 | MEDICATED_PAD | Freq: Every day | CUTANEOUS | Status: DC
  Administered 2015-09-08 – 2015-09-09 (×2): 6 via TOPICAL

## 2015-09-07 MED ORDER — METOPROLOL TARTRATE 5 MG/5ML IV SOLN
5.0000 mg | Freq: Once | INTRAVENOUS | Status: AC
  Administered 2015-09-07: 5 mg via INTRAVENOUS
  Filled 2015-09-07: qty 5

## 2015-09-07 NOTE — ED Notes (Signed)
Placed call to pharnacy for another bag of Labetalol

## 2015-09-07 NOTE — ED Notes (Signed)
Pt returned from CT.  Pt continues to c/o chest pain, MD made aware

## 2015-09-07 NOTE — Progress Notes (Signed)
[facility] Progress Note Patient Name: [name] DOB: [birth year] MRN: [id number]   Date of Service  09/07/2015  HPI/Events of Note  Patient c/o chronic back pain. Patient is on Narcotics for his back pain at home.   eICU Interventions  Will order: 1. Fentanyl 25-50 mcg IV Q 2 hours PRN pain.      Intervention Category Intermediate Interventions: Pain - evaluation and management  Sommer,Steven Cornelia Copa 09/07/2015, 10:48 PM

## 2015-09-07 NOTE — ED Provider Notes (Signed)
CSN: [id number]     Arrival date & time 09/07/15  1201 History   First MD Initiated Contact with Patient 09/07/15 1245     Chief Complaint  Patient presents with  . right sided chst pain       HPI Patient presents with 1-2 day history of chest pain mostly on the right side but radiates to the left and abdomen.  Patient has history of dissecting aorta.  Has had surgical repair.  Patient sees the [facility] cardiology.  Has never been diagnosed with heart attack.  Patient continues to smoke.  Denies cocaine abuse.  No diaphoresis or vomiting. Past Medical History  Diagnosis Date  . Hypertension     a. Hx of HTN urgency secondary to noncompliance. b. urinary metanephrine and catecholeamine levels normal 2013.  c. Renal art [name] 1/16:  No evidence of renal artery stenosis noted bilaterally.  . Anxiety   . GERD (gastroesophageal reflux disease)   . Chronic combined systolic and diastolic congestive heart failure ([place])     a. 05/2011: Adm with pulm edema/HTN urgency, EF 35-40% with diffuse hypokinesis and moderate to severe mitral regurgitation. Cardiomyopathy likely due to uncontrolled HTN and ETOH abuse - cath deferred due to renal insufficiency (felt due to uncontrolled HTN). bJodie Echevaria MV 06/2011: EF 37% and no ischemia or infarction. c. EF 45-50% by echo 01/2012.  . Cardiomyopathy ([place])     a. non-ischemic - probably related to untreated HTN and ETOH abuse - Echo 3/13 with EF 35-40% >> b. Echo 4/16: Severe LVH, EF 55-60%, moderate AI, moderate MR, mild LAE, trivial effusion, known type B dissection with communication between true and false lumens with suprasternal images suggesting dissection plane may propagate to at least left subclavian takeoff, root above aortic valve okay    . HYPERLIPIDEMIA   . INGUINAL HERNIA   . ETOH abuse     a. Reported to have quit 05/2011.  . Valvular heart disease     a. Echo 05/2011: moderate to severe eccentric MR and mild to moderate AI with prolapsing left coronary  cusp. b. Echo 01/2012: mild-mod AI, mild dilitation of aortic root, mild MR.;  c. Echo 1/16: Severe LVH consistent with hypertrophic cardio myopathy, EF 50%, no RWMA, mod AI, mild MR, mild RAE, dilated Ao root (40 mm);   [name] Tobacco abuse   . Headache(784.0)     "q other day" (08/08/2013)  . Chronic sinusitis   . History of medication noncompliance   . Chronic abdominal pain   . DDD (degenerative disc disease), lumbar   . Aortic dissection ([facility])     a. admx 04/2014 >> L renal infarct; a/c renal failure >> b.  s/p Bioprosthetic [place] and total arch replacement and staged endovascular repair of descending aortic aneurysm ([facility] - Dr. [name])  . CAD (coronary artery disease)     a. LHC 4/16:  oD1 60%  . Aortic disease ([place])   . History of echocardiogram     Echo 1/17:  Severe LVH, EF 55-60%, no RWMA, Gr 2 DD, AVR ok, mild to mod MR, mild LAE, mild reduced RVSF, mod RAE  . Heart murmur   . Pneumonia ~ 2013  . CKD (chronic kidney disease)     a. 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  peak creatinine 3.46 during admx for aortic dissection 2/16.  sees Dr [name]  . Renal insufficiency   . Dissecting aneurysm of thoracic aorta ([place])   . Descending thoracic aortic aneurysm ([place])   .  Hemorrhoid thrombosis    Past Surgical History  Procedure Laterality Date  . Ankle surgery Bilateral     Fractures bilaterally  . Inguinal hernia repair Right ~ 1996  . Foot fracture surgery Bilateral 2004-2010    "got pins in both of them"  . Left heart catheterization with coronary angiogram N/A 06/21/2014    Procedure: LEFT HEART CATHETERIZATION WITH CORONARY ANGIOGRAM;  Surgeon: Larey Dresser, MD;  Location: [facility] CATH LAB;  Service: Cardiovascular;  Laterality: N/A;  . Aortic valve surgery  09/2014  . Hemorrhoid surgery N/A 06/15/2015    Procedure: HEMORRHOIDECTOMY;  Surgeon: Stark Klein, MD;  Location: [facility] OR;  Service: General;  Laterality: N/A;   Family History  Problem Relation Age of Onset  . Hypertension    .  Colon cancer Paternal Uncle   . Stroke Maternal Aunt   . Heart attack Brother   . Diabetes Maternal Aunt   . Throat cancer Neg Hx   . Pancreatic cancer Neg Hx   . Esophageal cancer Neg Hx   . Kidney disease Neg Hx   . Liver disease Neg Hx    Social History  Substance Use Topics  . Smoking status: Current Every Day Smoker -- 0.25 packs/day for 23 years    Types: Cigarettes  . Smokeless tobacco: Former Systems developer    Types: Chew     Comment: as of 05-22-14 down to 3-4 cigs daily- 06/14/15- 4 cigs  . Alcohol Use: No     Comment: + previous use    Review of Systems  All other systems reviewed and are negative  Allergies  Imdur and Tramadol  Home Medications   Prior to Admission medications   Medication Sig Start Date End Date Taking? Authorizing Provider  acetaminophen (TYLENOL) 325 MG tablet Take 650 mg by mouth every 6 (six) hours as needed (pain).   Yes Historical Provider, MD  acetaminophen-codeine (TYLENOL #3) 300-30 MG tablet Take 1 tablet by mouth every 8 (eight) hours as needed for moderate pain. 06/13/15  Yes Ladene Artist, MD  aspirin 81 MG tablet Take 81 mg by mouth daily.   Yes Historical Provider, MD  atorvastatin (LIPITOR) 20 MG tablet Take 20 mg by mouth daily. 02/07/15  Yes Historical Provider, MD  cloNIDine (CATAPRES) 0.2 MG tablet Take 0.2 mg by mouth 2 (two) times daily.   Yes Historical Provider, MD  docusate sodium (COLACE) 100 MG capsule Take 1 capsule (100 mg total) by mouth 2 (two) times daily. Patient taking differently: Take 100 mg by mouth 2 (two) times daily as needed for mild constipation.  06/15/15  Yes Stark Klein, MD  hydrALAZINE (APRESOLINE) 50 MG tablet Take 50 mg by mouth 2 (two) times daily. 12/04/14  Yes Historical Provider, MD  hydrocortisone (ANUSOL-HC) 25 MG suppository Place 1 suppository (25 mg total) rectally every 12 (twelve) hours. Patient taking differently: Place 25 mg rectally 2 (two) times daily as needed for hemorrhoids.  06/12/15  Yes  Ladene Artist, MD  labetalol (NORMODYNE) 300 MG tablet Take 2 tablets (600 mg total) by mouth 2 (two) times daily. 12/14/14  Yes Scott T Kathlen Mody, PA-C  dibucaine (NUPERCAINAL) 1 % ointment Apply topically 3 (three) times daily as needed for pain. Patient not taking: Reported on 09/07/2015 06/15/15   Stark Klein, MD  meclizine (ANTIVERT) 25 MG tablet Take 1 tablet (25 mg total) by mouth 3 (three) times daily as needed for nausea. Patient not taking: Reported on 08/28/2015 06/03/15   Margarita Mail, PA-C  morphine (MSIR) 15 MG tablet Take 1 tablet (15 mg total) by mouth every 4 (four) hours as needed for severe pain. Patient not taking: Reported on 08/28/2015 06/03/15   Margarita Mail, PA-C  Na Sulfate-K Sulfate-Mg Sulf 17.5-3.13-1.6 GM/180ML SOLN Suprep (no substitutions)-TAKE AS DIRECTED. Patient not taking: Reported on 09/07/2015 08/28/15   Ladene Artist, MD  oxyCODONE (OXY IR/ROXICODONE) 5 MG immediate release tablet Take 1-2 tablets (5-10 mg total) by mouth every 4 (four) hours as needed for moderate pain, severe pain or breakthrough pain. Patient not taking: Reported on 08/28/2015 06/15/15   Stark Klein, MD  pantoprazole (PROTONIX) 20 MG tablet Take 1 tablet (20 mg total) by mouth daily. Patient not taking: Reported on 08/28/2015 06/03/15   Margarita Mail, PA-C  pramoxine-hydrocortisone [facility] cream Apply topically 3 (three) times daily. Patient not taking: Reported on 08/28/2015 06/12/15   Ladene Artist, MD   BP 178/105 mmHg  Pulse 69  Temp(Src) 98.4 F (36.9 C) (Oral)  Resp 16  SpO2 99% Physical Exam Physical Exam  Nursing note and vitals reviewed. Constitutional: He is oriented to person, place, and time. He appears well-developed and well-nourished.  Appears uncomfortable because of chest pain.  HENT:  Head: Normocephalic and atraumatic.  Eyes: Pupils are equal, round, and reactive to light.  Neck: Normal range of motion.  Cardiovascular: Normal rate and intact distal pulses.    Pulmonary/Chest: No respiratory distress.  Abdominal: Normal appearance. He exhibits no distension.  Musculoskeletal: Normal range of motion.  Neurological: He is alert and oriented to person, place, and time. No cranial nerve deficit.  Skin: Skin is warm and dry. No rash noted.  Psychiatric: He has a normal mood and affect. His behavior is normal.   ED Course  Procedures (including critical care time) Medications  labetalol (NORMODYNE,TRANDATE) 500 mg in dextrose 5 % 125 mL (4 mg/mL) infusion (3 mg/min Intravenous Rate/Dose Change 09/07/15 1508)  morphine 2 MG/ML injection 2 mg (2 mg Intravenous Given 09/07/15 1310)  metoprolol (LOPRESSOR) injection 5 mg (5 mg Intravenous Given 09/07/15 1306)  aspirin chewable tablet 243 mg (243 mg Oral Given 09/07/15 1314)  morphine 4 MG/ML injection 4 mg (4 mg Intravenous Given 09/07/15 1333)  iopamidol (ISOVUE-370) 76 % injection (100 mLs  Contrast Given 09/07/15 1430)  HYDROmorphone (DILAUDID) injection 1 mg (1 mg Intravenous Given 09/07/15 1550)    Labs Review Labs Reviewed  BASIC METABOLIC PANEL - Abnormal; Notable for the following:    Creatinine, Ser 1.41 (*)    GFR calc non Af Amer 60 (*)    All other components within normal limits  CBC - Abnormal; Notable for the following:    RDW 16.8 (*)    Platelets 111 (*)    All other components within normal limits  I-STAT TROPOININ, ED - Abnormal; Notable for the following:    Troponin i, poc 0.17 (*)    All other components within normal limits  I-STAT TROPOININ, ED    Imaging Review Dg Chest 2 View  09/07/2015  CLINICAL DATA:  Shortness of breath with right-sided chest pain. EXAM: CHEST  2 VIEW COMPARISON:  06/03/2015 FINDINGS: Stable appearance of the thoracic aortic stent graft. Prior median sternotomy procedure with evidence of aortic valve and aortic root replacement. Surgical clips in the right upper axillary region. Both lungs are clear. Negative for a pneumothorax. Heart size is mildly  enlarged but unchanged. The trachea is midline. No large pleural effusions. No acute bone abnormality. IMPRESSION: No active cardiopulmonary disease.  Stable postsurgical changes. Electronically Signed   By: Markus Daft M.D.   On: 09/07/2015 12:54   Ct Angio Chest/abd/pel For Dissection W And/or W/wo  09/07/2015  CLINICAL DATA:  Right upper chest pain, back pain and left lower quadrant abdominal pain. History of TEVAR to treat aortic dissection with known chronic abdominal aortic dissection. Hypertension. EXAM: CT ANGIOGRAPHY CHEST, ABDOMEN AND PELVIS TECHNIQUE: Multidetector CT imaging through the chest, abdomen and pelvis was performed using the standard protocol during bolus administration of intravenous contrast. Multiplanar reconstructed images and MIPs were obtained and reviewed to evaluate the vascular anatomy. CONTRAST:  100 mL Isovue 370 IV COMPARISON:  Multiple prior CTA examinations with the latest on 03/21/2015. FINDINGS: CTA CHEST FINDINGS Appearance of prosthetic aortic valve and thoracic aortic endograft are stable since prior CTA. Reimplanted proximal great vessels are normally patent. No evidence of acute dissection of the thoracic aorta. Mural thrombus adjacent to the descending thoracic endograft appears stable. There is stable perfusion of the distal aneurysm sac in the lower chest related to opacification of the false lumen of a chronic aortic dissection in the abdomen. Lungs show mild bibasilar atelectasis. No edema, infiltrate, nodule or pneumothorax is identified. No evidence of lymphadenopathy in the chest. Stable cardiac enlargement without pericardial fluid. Review of the MIP images confirms the above findings. CTA ABDOMEN AND PELVIS FINDINGS Just below the endograft, there is evidence of chronic dissection of the abdominal aorta. Maximal dimensions of the aorta are approximately 3.5 x 4.7 cm. This is minimally increased since the prior study where comparable dimensions were approximately  3.4 x 4.4 cm. Slight increase in dimensions appear to be related to slight increase in size of the false lumen. The true lumen supplies widely patent superior mesenteric artery and right renal artery. A left renal artery stent off of the true lumen shows patency. The false lumen supplies the celiac axis. Below the level of the left renal artery, the dissection resolves and the rest of the abdominal aorta is normally patent. 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 Bilateral internal and external iliac arteries show normal patency. The common femoral arteries and femoral bifurcations are normally patent. No evidence of solid organ or bowel abnormality on the CTA study. No abnormal fluid collections. Bony structures show no abnormalities. Review of the MIP images confirms the above findings. IMPRESSION: No acute findings by CTA of the chest, abdomen and pelvis. There is no evidence of acute dissection and the thoracic endograft shows stable patency. Caliber of the abdominal aorta at the level of chronic dissection appears slightly larger by measurement due to slight expansion of the false lumen. Visceral arteries remain normally patent in the abdomen with the celiac axis supplied by the false lumen and other major artery supplied by the true lumen. Electronically Signed   By: Aletta Edouard M.D.   On: 09/07/2015 15:41   I have personally reviewed and evaluated these images and lab results as part of my medical decision-making. CRITICAL CARE Performed by: Dot Lanes Total critical care time: 60 minutes Critical care time was exclusive of separately billable procedures and treating other patients. Critical care was necessary to treat or prevent imminent or life-threatening deterioration. Critical care was time spent personally by me on the following activities: development of treatment plan with patient and/or surrogate as well as nursing,  discussions with consultants, evaluation of patient's response to treatment, examination of patient, obtaining history from patient or  surrogate, ordering and performing treatments and interventions, ordering and review of laboratory studies, ordering and review of radiographic studies, pulse oximetry and re-evaluation of patient's condition.   EKG Interpretation   Date/Time:  Saturday September 07 2015 12:06:40 EDT Ventricular Rate:  62 PR Interval:  156 QRS Duration: 92 QT Interval:  424 QTC Calculation: 430 R Axis:   55 Text Interpretation:  Normal sinus rhythm Right atrial enlargement Left  ventricular hypertrophy with repolarization abnormality Cannot rule out  Septal infarct , age undetermined Abnormal ECG Confirmed by Audie Pinto  MD,  Westyn Driggers [phone]) on 09/07/2015 12:46:20 PM      Discussed with cardiology and critical care medicine.  Patient will be admitted to critical care and cardiology will follow. MDM   Final diagnoses:  Hypertensive emergency        Leonard Schwartz, MD 09/07/15 1622

## 2015-09-07 NOTE — ED Notes (Signed)
Admitting MD at bedside.

## 2015-09-07 NOTE — Consult Note (Signed)
Primary cardiologist: Dr Rowland Lathe Consulting cardiologist: Dr Carlyle Dolly MD  Clinical Summary [name] is a 45 y.o.male history of type B aortic dissection s/p repain, HTN, CKD, EtOH abuse, with multiple admits with HTN emergency admitted with severe HTN and chest pain. He reports 3 days of constant right sided chest pain. No significant SOB. Has noted bp's at home in the 180s, he reports strict compliance with his medications.   Regarding his dissection history, in 09/2014 he underwent aortic valved conduit root replacement with coronary reconstruction (button Bentall procedure) with #27 mm Sorin Mitroflow bovine pericardial vlaved conduit and total arch replacement with individual head replacement by Dr. [name]. He returned to the OR several days later for staged endovascular repair of the descending thoracic aortic aneurysm with intentional L subclavian artery coverage.Decision was made to proceed with covered stent placement in the left renal artery to seal the distal fenestration. This was done by Dr. [name] at [facility] 02/06/15.  CXR no acute process Hgb 13.2, Plt 111, K 3.8, Cr 1.41, trop 0.12 EKG SR, LVH with strain pattern Echo Jan 2017 LVEF 55-60%, no WMAs, grade II diastolic dysfunction, normal functioning AVR, mild to mod MR   Allergies  Allergen Reactions  . Imdur [Isosorbide Nitrate] Other (See Comments)    headache  . Tramadol Other (See Comments)    "headaches" per pt    Medications Scheduled Medications:     Infusions: . labetalol (NORMODYNE) infusion       PRN Medications:     Past Medical History  Diagnosis Date  . Hypertension     a. Hx of HTN urgency secondary to noncompliance. b. urinary metanephrine and catecholeamine levels normal 2013.  c. Renal art [name] 1/16:  No evidence of renal artery stenosis noted bilaterally.  . Anxiety   . GERD (gastroesophageal reflux disease)   . Chronic combined systolic and diastolic congestive heart  failure ([place])     a. 05/2011: Adm with pulm edema/HTN urgency, EF 35-40% with diffuse hypokinesis and moderate to severe mitral regurgitation. Cardiomyopathy likely due to uncontrolled HTN and ETOH abuse - cath deferred due to renal insufficiency (felt due to uncontrolled HTN). bJodie Echevaria MV 06/2011: EF 37% and no ischemia or infarction. c. EF 45-50% by echo 01/2012.  . Cardiomyopathy ([place])     a. non-ischemic - probably related to untreated HTN and ETOH abuse - Echo 3/13 with EF 35-40% >> b. Echo 4/16: Severe LVH, EF 55-60%, moderate AI, moderate MR, mild LAE, trivial effusion, known type B dissection with communication between true and false lumens with suprasternal images suggesting dissection plane may propagate to at least left subclavian takeoff, root above aortic valve okay    . HYPERLIPIDEMIA   . INGUINAL HERNIA   . ETOH abuse     a. Reported to have quit 05/2011.  . Valvular heart disease     a. Echo 05/2011: moderate to severe eccentric MR and mild to moderate AI with prolapsing left coronary cusp. b. Echo 01/2012: mild-mod AI, mild dilitation of aortic root, mild MR.;  c. Echo 1/16: Severe LVH consistent with hypertrophic cardio myopathy, EF 50%, no RWMA, mod AI, mild MR, mild RAE, dilated Ao root (40 mm);   [name] Tobacco abuse   . Headache(784.0)     "q other day" (08/08/2013)  . Chronic sinusitis   . History of medication noncompliance   . Chronic abdominal pain   . DDD (degenerative disc disease), lumbar   . Aortic dissection ([place])  a. admx 04/2014 >> L renal infarct; a/c renal failure >> b.  s/p Bioprosthetic Bentall and total arch replacement and staged endovascular repair of descending aortic aneurysm ([facility] - Dr. [name])  . CAD (coronary artery disease)     a. LHC 4/16:  oD1 60%  . Aortic disease ([place])   . History of echocardiogram     Echo 1/17:  Severe LVH, EF 55-60%, no RWMA, Gr 2 DD, AVR ok, mild to mod MR, mild LAE, mild reduced RVSF, mod RAE  . Heart murmur   . Pneumonia ~  2013  . CKD (chronic kidney disease)     a. 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  peak creatinine 3.46 during admx for aortic dissection 2/16.  sees Dr [name]  . Renal insufficiency   . Dissecting aneurysm of thoracic aorta ([place])   . Descending thoracic aortic aneurysm ([place])   . Hemorrhoid thrombosis     Past Surgical History  Procedure Laterality Date  . Ankle surgery Bilateral     Fractures bilaterally  . Inguinal hernia repair Right ~ 1996  . Foot fracture surgery Bilateral 2004-2010    "got pins in both of them"  . Left heart catheterization with coronary angiogram N/A 06/21/2014    Procedure: LEFT HEART CATHETERIZATION WITH CORONARY ANGIOGRAM;  Surgeon: Larey Dresser, MD;  Location: [facility] CATH LAB;  Service: Cardiovascular;  Laterality: N/A;  . Aortic valve surgery  09/2014  . Hemorrhoid surgery N/A 06/15/2015    Procedure: HEMORRHOIDECTOMY;  Surgeon: Stark Klein, MD;  Location: [facility] OR;  Service: General;  Laterality: N/A;    Family History  Problem Relation Age of Onset  . Hypertension    . Colon cancer Paternal Uncle   . Stroke Maternal Aunt   . Heart attack Brother   . Diabetes Maternal Aunt   . Throat cancer Neg Hx   . Pancreatic cancer Neg Hx   . Esophageal cancer Neg Hx   . Kidney disease Neg Hx   . Liver disease Neg Hx     Social History [name] reports that he has been smoking Cigarettes.  He has a 5.75 pack-year smoking history. He has quit using smokeless tobacco. His smokeless tobacco use included Chew. [name] reports that he does not drink alcohol.  Review of Systems CONSTITUTIONAL: No weight loss, fever, chills, weakness or fatigue.  HEENT: Eyes: No visual loss, blurred vision, double vision or yellow sclerae. No hearing loss, sneezing, congestion, runny nose or sore throat.  SKIN: No rash or itching.  CARDIOVASCULAR: per HPI RESPIRATORY: No shortness of breath, cough or sputum.  GASTROINTESTINAL: No anorexia, nausea, vomiting or diarrhea. No abdominal  pain or blood.  GENITOURINARY: no polyuria, no dysuria NEUROLOGICAL: No headache, dizziness, syncope, paralysis, ataxia, numbness or tingling in the extremities. No change in bowel or bladder control.  MUSCULOSKELETAL: No muscle, back pain, joint pain or stiffness.  HEMATOLOGIC: No anemia, bleeding or bruising.  LYMPHATICS: No enlarged nodes. No history of splenectomy.  PSYCHIATRIC: No history of depression or anxiety.      Physical Examination Blood pressure 178/98, pulse 51, temperature 98.4 F (36.9 C), temperature source Oral, resp. rate 17, SpO2 99 %. No intake or output data in the 24 hours ending 09/07/15 1400  HEENT: sclera clear, throat clear  Cardiovascular: RRR, 2/6 systolic murmur RUSB, mechanical S2, no JVD  Respiratory: CTAB  GI: abdomen soft, NT, ND  MSK: right chest wall tender to palpation  Neuro: no focal deficits  Psych: appropriate affect  Lab Results  Basic Metabolic Panel:  Recent Labs Lab 09/07/15 1215  NA 139  K 3.8  CL 106  CO2 25  GLUCOSE 84  BUN 11  CREATININE 1.41*  CALCIUM 9.6    Liver Function Tests: No results for input(s): AST, ALT, ALKPHOS, BILITOT, PROT, ALBUMIN in the last 168 hours.  CBC:  Recent Labs Lab 09/07/15 1215  WBC 4.9  HGB 13.2  HCT 41.3  MCV 87.3  PLT 111*    Cardiac Enzymes: No results for input(s): CKTOTAL, CKMB, CKMBINDEX, TROPONINI in the last 168 hours.  BNP: Invalid input(s): POCBNP     Impression/Recommendations 1. Chest pain - multiple previous admissions of chest pain in setting of severe HTN - today reports atypical chest pain, right sided ongoing for 3 days constantly, worst with palpation on exam. Not consistent with cardiac ischemia despite mild troponin elevation. Probable HTN emergency. - given his complex history of aortic dissection he will require a repeat CTA - bp control with labetolol drip, goal to decrease MAP no more than 25% over first 24 hours - cycle cardiac enzymes.  Presentation likely from hypertensive emergency. Previous cath did show mild nonobstructive disease. Follow troponin trend and symptoms with bp control. No plans for ischemic testing at this time. No indication for anticoagulation.   2. Aortic dissection - previous complex repair at [facility] - 09/2014 aortic valve conduit root replacement with coronary reconstruction with 56mm Sorin Mitroflow bovine pericardial conduit (Bentall procedure), followed by staged endovascular repair of descending thoracic aortic aneurysm. 01/2015 had left renal artery patch repair due to residual dissection. - CTA shows no acute change - bp control with labetolol drip    Discussed with ER attending, critical care to admit, cardiology to follow as consult.   Carlyle Dolly, M.D.

## 2015-09-07 NOTE — ED Notes (Signed)
CT called to get patient.

## 2015-09-07 NOTE — ED Notes (Signed)
MD at bedside. 

## 2015-09-07 NOTE — Progress Notes (Signed)
Full consult note to follow. Discussed patient with ER attending. Admitted with 24 hours of atypical chest pain, severe HTN, mild troponin elevation. EKG SR, chronic LVH pattern.  He has history of prior aortic dissection and repair. I have asked for CTA of aorta. If negative, presentation most consistent with hypertensive emergency given SBPs in 200s as opposed to ACS. He has started labetalol drip in ER, recommend slow decreasing of MAP (decrease MAP 25% within first 24 hrs). Pending CTA results consider cardiology admission.   Zandra Abts MD

## 2015-09-07 NOTE — ED Notes (Signed)
Pt took 1 81mg  aspirin this morning.

## 2015-09-07 NOTE — H&P (Signed)
PULMONARY / CRITICAL CARE MEDICINE   Name: [name] MRN: [id number] DOB: 08/21/1970    ADMISSION DATE:  09/07/2015  REFERRING MD:  Dr Audie Pinto  CHIEF COMPLAINT:  Chest pain  HISTORY OF PRESENT ILLNESS:   45 year old man with a history of chronic combined systolic and diastolic CHF, hypertension, type B aortic dissection in the setting of hypertensive emergency. He underwent aortic valve conduit root replacement with coronary reconstruction, then subsequently staged endovascular repair of the descending thoracic aortic aneurysm at [facility] in July 2016. Course complicated by renal failure. His last CT scan of the chest at this facility was January 2017 which showed stable postoperative changes, cardiomegaly trace peripheral effusions. He presented to the emergency department 09/07/15 with 24 hours of atypical chest pain. He was noted to be severely hypertensive with systolic blood pressures in the 200s. He had a slightly elevated troponin A CT scan of the chest has been performed data showed no acute findings without any evidence of acute dissection. The thoracic endograft was patent. There was a slight enlargement of the false lumen at the level of the chronic dissection. He was started on labetalol drip in the emergency department.   The patient reports he has had ongoing headaches for one month in the am.  He equates them to feeling like a "hangover" but he does not drink anymore.  He woke 3 days ago with right upper chest pain, back pain and then moved across his chest.  He reports some lightheadedness but no syncope or pre-syncope.  He denies changes in medications recently and endorses medication compliance.  NOTE he takes clonidine TID 0.2mg .  Pt denies ETOH use in the last year since his surgery.  He denies cocaine use.  Does smoke marijuana.   PCCM called for ICU admission.  PAST MEDICAL HISTORY :  He  has a past medical history of Hypertension; Anxiety; GERD (gastroesophageal reflux disease);  Chronic combined systolic and diastolic congestive heart failure ([place]); Cardiomyopathy ([place]); HYPERLIPIDEMIA; INGUINAL HERNIA; ETOH abuse; Valvular heart disease; Tobacco abuse; Headache(784.0); Chronic sinusitis; History of medication noncompliance; Chronic abdominal pain; DDD (degenerative disc disease), lumbar; Aortic dissection ([facility]); CAD (coronary artery disease); Aortic disease ([place]); History of echocardiogram; Heart murmur; Pneumonia (~ 2013); CKD (chronic kidney disease); Renal insufficiency; Dissecting aneurysm of thoracic aorta ([place]); Descending thoracic aortic aneurysm ([place]); and Hemorrhoid thrombosis.  PAST SURGICAL HISTORY: He  has past surgical history that includes Ankle surgery (Bilateral); Inguinal hernia repair (Right, ~ 1996); Foot fracture surgery (Bilateral, 2004-2010); left heart catheterization with coronary angiogram (N/A, 06/21/2014); Aortic valve surgery (09/2014); and Hemorrhoid surgery (N/A, 06/15/2015).  Allergies  Allergen Reactions  . Imdur [Isosorbide Nitrate] Other (See Comments)    headache  . Tramadol Other (See Comments)    "headaches" per pt    No current facility-administered medications on file prior to encounter.   Current Outpatient Prescriptions on File Prior to Encounter  Medication Sig  . acetaminophen (TYLENOL) 325 MG tablet Take 650 mg by mouth every 6 (six) hours as needed (pain).  [name] acetaminophen-codeine (TYLENOL #3) 300-30 MG tablet Take 1 tablet by mouth every 8 (eight) hours as needed for moderate pain.  [name] atorvastatin (LIPITOR) 20 MG tablet Take 20 mg by mouth daily.  . cloNIDine (CATAPRES) 0.2 MG tablet Take 0.2 mg by mouth 2 (two) times daily.  [name] docusate sodium (COLACE) 100 MG capsule Take 1 capsule (100 mg total) by mouth 2 (two) times daily. (Patient taking differently: Take 100 mg by mouth 2 (two)  times daily as needed for mild constipation. )  . hydrALAZINE (APRESOLINE) 50 MG tablet Take 50 mg by mouth 2 (two) times daily.  . hydrocortisone  (ANUSOL-HC) 25 MG suppository Place 1 suppository (25 mg total) rectally every 12 (twelve) hours. (Patient taking differently: Place 25 mg rectally 2 (two) times daily as needed for hemorrhoids. )  . labetalol (NORMODYNE) 300 MG tablet Take 2 tablets (600 mg total) by mouth 2 (two) times daily.  . dibucaine (NUPERCAINAL) 1 % ointment Apply topically 3 (three) times daily as needed for pain. (Patient not taking: Reported on 09/07/2015)  . meclizine (ANTIVERT) 25 MG tablet Take 1 tablet (25 mg total) by mouth 3 (three) times daily as needed for nausea. (Patient not taking: Reported on 08/28/2015)  . morphine (MSIR) 15 MG tablet Take 1 tablet (15 mg total) by mouth every 4 (four) hours as needed for severe pain. (Patient not taking: Reported on 08/28/2015)  . Na Sulfate-K Sulfate-Mg Sulf 17.5-3.13-1.6 GM/180ML SOLN Suprep (no substitutions)-TAKE AS DIRECTED. (Patient not taking: Reported on 09/07/2015)  . oxyCODONE (OXY IR/ROXICODONE) 5 MG immediate release tablet Take 1-2 tablets (5-10 mg total) by mouth every 4 (four) hours as needed for moderate pain, severe pain or breakthrough pain. (Patient not taking: Reported on 08/28/2015)  . pantoprazole (PROTONIX) 20 MG tablet Take 1 tablet (20 mg total) by mouth daily. (Patient not taking: Reported on 08/28/2015)  . pramoxine-hydrocortisone (ANALPRAM HC) cream Apply topically 3 (three) times daily. (Patient not taking: Reported on 08/28/2015)  . [DISCONTINUED] nitroGLYCERIN (NITRODUR - DOSED IN MG/24 HR) 0.4 mg/hr patch Place 1 patch (0.4 mg total) onto the skin daily.    FAMILY HISTORY:  His indicated that his mother is alive. He indicated that his father is alive.   SOCIAL HISTORY: He  reports that he has been smoking Cigarettes.  He has a 5.75 pack-year smoking history. He has quit using smokeless tobacco. His smokeless tobacco use included Chew. He reports that he uses illicit drugs (Marijuana). He reports that he does not drink alcohol.  REVIEW OF SYSTEMS:   POSITIVES IN BOLD Gen: Denies fever, chills, weight change, fatigue, night sweats HEENT: Denies blurred vision, double vision, hearing loss, tinnitus, sinus congestion, rhinorrhea, sore throat, neck stiffness, dysphagia.  Early am headaches.   PULM: Denies shortness of breath, cough, sputum production, hemoptysis, wheezing CV: Denies chest pain, edema, orthopnea, paroxysmal nocturnal dyspnea, palpitations GI: Denies abdominal pain, nausea, vomiting, diarrhea, hematochezia, melena, constipation, change in bowel habits GU: Denies dysuria, hematuria, polyuria, oliguria, urethral discharge Endocrine: Denies hot or cold intolerance, polyuria, polyphagia or appetite change Derm: Denies rash, dry skin, scaling or peeling skin change Heme: Denies easy bruising, bleeding, bleeding gums Neuro: Denies headache, numbness, weakness, slurred speech, loss of memory or consciousness   SUBJECTIVE:   VITAL SIGNS: BP 178/105 mmHg  Pulse 69  Temp(Src) 98.4 F (36.9 C) (Oral)  Resp 16  SpO2 99%  HEMODYNAMICS:    VENTILATOR SETTINGS:    INTAKE / OUTPUT:    PHYSICAL EXAMINATION: General:  Thin adult male in NAD, lying on stretcher Neuro:  AAOx4, speech clear, MAE  HEENT:  MM pink/moist, mild JVD Cardiovascular:  s1s2 rrr, ? SEM 2-3/6 Lungs:  Even/non-labored on RA, lungs bilaterally coarse  Abdomen:  Flat/soft, bsx4 active, tolerating PO's  Musculoskeletal:  No acute deformities Skin:  Multiple tattoos, no rashes or lesions  LABS:  BMET  Recent Labs Lab 09/07/15 1215  NA 139  K 3.8  CL 106  CO2 25  BUN 11  CREATININE 1.41*  GLUCOSE 84    Electrolytes  Recent Labs Lab 09/07/15 1215  CALCIUM 9.6    CBC  Recent Labs Lab 09/07/15 1215  WBC 4.9  HGB 13.2  HCT 41.3  PLT 111*    Coag's No results for input(s): APTT, INR in the last 168 hours.  Sepsis Markers No results for input(s): LATICACIDVEN, PROCALCITON, O2SATVEN in the last 168 hours.  ABG No results for  input(s): PHART, PCO2ART, PO2ART in the last 168 hours.  Liver Enzymes No results for input(s): AST, ALT, ALKPHOS, BILITOT, ALBUMIN in the last 168 hours.  Cardiac Enzymes No results for input(s): TROPONINI, PROBNP in the last 168 hours.  Glucose No results for input(s): GLUCAP in the last 168 hours.  Imaging Dg Chest 2 View  09/07/2015  CLINICAL DATA:  Shortness of breath with right-sided chest pain. EXAM: CHEST  2 VIEW COMPARISON:  06/03/2015 FINDINGS: Stable appearance of the thoracic aortic stent graft. Prior median sternotomy procedure with evidence of aortic valve and aortic root replacement. Surgical clips in the right upper axillary region. Both lungs are clear. Negative for a pneumothorax. Heart size is mildly enlarged but unchanged. The trachea is midline. No large pleural effusions. No acute bone abnormality. IMPRESSION: No active cardiopulmonary disease. Stable postsurgical changes. Electronically Signed   By: Markus Daft M.D.   On: 09/07/2015 12:54   Ct Angio Chest/abd/pel For Dissection W And/or W/wo  09/07/2015  CLINICAL DATA:  Right upper chest pain, back pain and left lower quadrant abdominal pain. History of TEVAR to treat aortic dissection with known chronic abdominal aortic dissection. Hypertension. EXAM: CT ANGIOGRAPHY CHEST, ABDOMEN AND PELVIS TECHNIQUE: Multidetector CT imaging through the chest, abdomen and pelvis was performed using the standard protocol during bolus administration of intravenous contrast. Multiplanar reconstructed images and MIPs were obtained and reviewed to evaluate the vascular anatomy. CONTRAST:  100 mL Isovue 370 IV COMPARISON:  Multiple prior CTA examinations with the latest on 03/21/2015. FINDINGS: CTA CHEST FINDINGS Appearance of prosthetic aortic valve and thoracic aortic endograft are stable since prior CTA. Reimplanted proximal great vessels are normally patent. No evidence of acute dissection of the thoracic aorta. Mural thrombus adjacent to the  descending thoracic endograft appears stable. There is stable perfusion of the distal aneurysm sac in the lower chest related to opacification of the false lumen of a chronic aortic dissection in the abdomen. Lungs show mild bibasilar atelectasis. No edema, infiltrate, nodule or pneumothorax is identified. No evidence of lymphadenopathy in the chest. Stable cardiac enlargement without pericardial fluid. Review of the MIP images confirms the above findings. CTA ABDOMEN AND PELVIS FINDINGS Just below the endograft, there is evidence of chronic dissection of the abdominal aorta. Maximal dimensions of the aorta are approximately 3.5 x 4.7 cm. This is minimally increased since the prior study where comparable dimensions were approximately 3.4 x 4.4 cm. Slight increase in dimensions appear to be related to slight increase in size of the false lumen. The true lumen supplies widely patent superior mesenteric artery and right renal artery. A left renal artery stent off of the true lumen shows patency. The false lumen supplies the celiac axis. Below the level of the left renal artery, the dissection resolves and the rest of the abdominal aorta is normally patent. 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 Bilateral internal and external iliac arteries show normal patency. The common femoral arteries and femoral bifurcations are  normally patent. No evidence of solid organ or bowel abnormality on the CTA study. No abnormal fluid collections. Bony structures show no abnormalities. Review of the MIP images confirms the above findings. IMPRESSION: No acute findings by CTA of the chest, abdomen and pelvis. There is no evidence of acute dissection and the thoracic endograft shows stable patency. Caliber of the abdominal aorta at the level of chronic dissection appears slightly larger by measurement due to slight expansion of the false lumen. Visceral arteries remain  normally patent in the abdomen with the celiac axis supplied by the false lumen and other major artery supplied by the true lumen. Electronically Signed   By: Aletta Edouard M.D.   On: 09/07/2015 15:41     STUDIES:  CT chest 6/24 >> no evidence of acute dissection thoracic endograft shows stable patency, caliber of the abdominal aorta at the level of the chronic dissection appears slightly larger than prior imaging UDS 6/24 >>   CULTURE: None  ANTIBIOTICS: None  SIGNIFICANT EVENTS: 6/24  Admit with hypertensive emergency   LINES/TUBES:  DISCUSSION: 45 year old man who presents with atypical chest pain and severe hypertension.  ASSESSMENT / PLAN:  PULMONARY A: Tobacco abuse P:   Pulmonary hygiene Cessation counseling  CARDIOVASCULAR A:  Hypertensive emergency Hyperlipidemia Chronic systolic and diastolic CHF Nonischemic cardiomyopathy Moderate to severe eccentric mitral regurgitation Coronary artery disease P:  Transition to Cardene gtt Goal systolic blood pressure 0000000 Continue home clonidine, hydralazine, labetalol PO Continue atorvastatin Trend troponin Assess UDS ASA   RENAL A:   Chronic renal insufficiency stage II P:   Follow BMP after dye load Replace electrolytes as indicated   GASTROINTESTINAL A:   GERD P:   Pantoprazole by mouth Heart healthy diet as tolerated  HEMATOLOGIC A:   Thrombocytopenia  P:  Follow CBC  INFECTIOUS A:   No evidence of active infection P:   Follow clinically  ENDOCRINE A:   No acute issues P:    NEUROLOGIC A:   Headaches - suspect secondary to HTN P:   RASS goal: 0 BP control No neuro deficits to suggest need for imaging    FAMILY  - Updates:  Patient updated on plan of care.   - Inter-disciplinary family meet or Palliative Care meeting due by: 09/13/15   Noe Gens, NP-C [facility] Pgr: [phone] or if no answer [phone] 09/07/2015, 4:48 PM   Attending Note:  I  have examined patient, reviewed labs, studies and notes. I have discussed the case with B Ollis, and I agree with the data and plans as amended above. 45 yo man with severe HTN and hx of aortic dissection with repair, presents with R chest pain, some HA. In the ED his BP was [id number] systolic. CT chest confirmed that his aortic repair is intact without evidence of any further aortic injury. He was started on labetalol. On my eval he is awake, comfortable but his BP remains elevated to 200's. No crackles on lung exam. MS and neuro exam normal. No LE edema. I will convert him over to cardene gtt, continue his home labetalol + clonidine + hydralazine. Target SBP = 160. Independent critical care time is 45 minutes.   Baltazar Apo, MD, PhD 09/07/2015, 5:38 PM [facility] [phone] or if no answer [phone]

## 2015-09-07 NOTE — ED Notes (Signed)
Pt remains in CT

## 2015-09-07 NOTE — ED Notes (Signed)
RN and MD discussed MAP goal of 106; no bolus to be given since BP is decreasing.

## 2015-09-07 NOTE — ED Notes (Signed)
Patient complains of right sided chest pain x 3 days with back pain and left rib pain. States that "my stent is acting up"

## 2015-09-08 LAB — BASIC METABOLIC PANEL
Anion gap: 6 (ref 5–15)
BUN: 13 mg/dL (ref 6–20)
CALCIUM: 8.8 mg/dL — AB (ref 8.9–10.3)
CO2: 26 mmol/L (ref 22–32)
Chloride: 103 mmol/L (ref 101–111)
Creatinine, Ser: 1.61 mg/dL — ABNORMAL HIGH (ref 0.61–1.24)
GFR calc Af Amer: 59 mL/min — ABNORMAL LOW (ref >60)
GFR calc non Af Amer: 51 mL/min — ABNORMAL LOW (ref >60)
GLUCOSE: 88 mg/dL (ref 65–99)
POTASSIUM: 3.4 mmol/L — AB (ref 3.5–5.1)
SODIUM: 135 mmol/L (ref 135–145)

## 2015-09-08 LAB — TROPONIN I: Troponin I: 0.1 ng/mL — ABNORMAL HIGH (ref <0.031)

## 2015-09-08 MED ORDER — ACETAMINOPHEN 325 MG PO TABS
650.0000 mg | ORAL_TABLET | Freq: Four times a day (QID) | ORAL | Status: DC | PRN
  Administered 2015-09-09: 650 mg via ORAL
  Filled 2015-09-08: qty 2

## 2015-09-08 MED ORDER — POTASSIUM CHLORIDE CRYS ER 20 MEQ PO TBCR
20.0000 meq | EXTENDED_RELEASE_TABLET | Freq: Two times a day (BID) | ORAL | Status: DC
  Administered 2015-09-08 – 2015-09-09 (×3): 20 meq via ORAL
  Filled 2015-09-08 (×3): qty 1

## 2015-09-08 MED ORDER — OXYCODONE HCL 5 MG PO TABS
5.0000 mg | ORAL_TABLET | ORAL | Status: DC | PRN
  Administered 2015-09-08 – 2015-09-09 (×2): 5 mg via ORAL
  Filled 2015-09-08 (×2): qty 1

## 2015-09-08 MED ORDER — POTASSIUM CHLORIDE 10 MEQ/100ML IV SOLN
10.0000 meq | INTRAVENOUS | Status: DC
  Administered 2015-09-08 (×2): 10 meq via INTRAVENOUS
  Filled 2015-09-08 (×3): qty 100

## 2015-09-08 NOTE — Progress Notes (Signed)
[facility] Progress Note Patient Name: [name] DOB: [birth year] MRN: [id number]   Date of Service  09/08/2015  HPI/Events of Note  Chronic back pain. Takes Tylenol PRN at home.  eICU Interventions  Will order Tylenol 650 mg PO Q 6 hours PRN.     Intervention Category Intermediate Interventions: Pain - evaluation and management  Sommer,Steven Cornelia Copa 09/08/2015, 7:58 PM

## 2015-09-08 NOTE — Progress Notes (Signed)
Arrival Method: via wheelchair Mental Status: alert and oriented x 4 Telemetry: applied and CCMD notified Skin: intact Tubes: n/a IV: LFA, RFA Pain: 0/10 Family: none present Living Situation: home with wife  Safety Measures: bed in lowest position, non skid socks on, call bell in reach 6E Orientation: oriented to staff and unit

## 2015-09-08 NOTE — Progress Notes (Signed)
Report called to nurse [facility].

## 2015-09-08 NOTE — Progress Notes (Signed)
MD notified for patient reported back pain and discomfort. Fentanyl 50 mcg administered at shift change.  Patient reports to take Tylenol at home.  Environmental comforts provided.

## 2015-09-08 NOTE — Progress Notes (Signed)
eLink Physician-Brief Progress Note Patient Name: [name] DOB: 05-11-1970 MRN: [id number]   Date of Service  09/08/2015  HPI/Events of Note  Hypokalemia 3.4. Creatinine 1.6. PIV access.  eICU Interventions  KCl 54mEq IV x3 rims     Intervention Category Intermediate Interventions: Electrolyte abnormality - evaluation and management  Tera Partridge 09/08/2015, 5:13 AM

## 2015-09-08 NOTE — Progress Notes (Signed)
Attempted report. Awaiting call back from nurse.

## 2015-09-08 NOTE — Progress Notes (Signed)
[facility] Progress Note Patient Name: [name] DOB: [birth year] MRN: [id number]   Date of Service  09/08/2015  HPI/Events of Note  Patient demands narcotics for his chronic pain that he said he took extra strength Tylenol for.   eICU Interventions  Will order: 1. Oxycodone IR 5 mg PO Q 4 hours PRN pain.      Intervention Category Intermediate Interventions: Pain - evaluation and management  Sommer,Steven Cornelia Copa 09/08/2015, 9:29 PM

## 2015-09-08 NOTE — Progress Notes (Addendum)
Subjective:   Still with some right chest pain, worst with movement and palpation   Objective:   Temp:  [97.4 F (36.3 C)-98.4 F (36.9 C)] 97.9 F (36.6 C) (06/25 0746) Pulse Rate:  [51-74] 68 (06/25 1009) Resp:  [10-22] 18 (06/25 1000) BP: (110-216)/(64-117) 159/83 mmHg (06/25 1009) SpO2:  [93 %-100 %] 99 % (06/25 1000) Weight:  [151 lb 7.3 oz (68.7 kg)] 151 lb 7.3 oz (68.7 kg) (06/25 0500)    Filed Weights   09/07/15 1900 09/08/15 0500  Weight: 151 lb 7.3 oz (68.7 kg) 151 lb 7.3 oz (68.7 kg)    Intake/Output Summary (Last 24 hours) at 09/08/15 1052 Last data filed at 09/08/15 1000  Gross per 24 hour  Intake 258.75 ml  Output    800 ml  Net -541.25 ml     Exam:  General: NAD  HEENT: sclera clear, throat clear  Resp: CTAB  Cardiac: RRR, mechanical S2  [id number] soft, NT, ND  MSK: right chest wall tender to pain  Neuro: no focal deficits  Psych: appropriate affect  Lab Results:  Basic Metabolic Panel:  Recent Labs Lab 09/07/15 1215 09/08/15 0250  NA 139 135  K 3.8 3.4*  CL 106 103  CO2 25 26  GLUCOSE 84 88  BUN 11 13  CREATININE 1.41* 1.61*  CALCIUM 9.6 8.8*    Liver Function Tests: No results for input(s): AST, ALT, ALKPHOS, BILITOT, PROT, ALBUMIN in the last 168 hours.  CBC:  Recent Labs Lab 09/07/15 1215  WBC 4.9  HGB 13.2  HCT 41.3  MCV 87.3  PLT 111*    Cardiac Enzymes: No results for input(s): CKTOTAL, CKMB, CKMBINDEX, TROPONINI in the last 168 hours.  BNP: No results for input(s): PROBNP in the last 8760 hours.  Coagulation: No results for input(s): INR in the last 168 hours.  ECG:   Medications:   Scheduled Medications: . Chlorhexidine Gluconate Cloth  6 each Topical Q0600  . cloNIDine  0.2 mg Oral TID  . heparin  5,000 Units Subcutaneous Q8H  . hydrALAZINE  50 mg Oral BID  . labetalol  600 mg Oral BID  . mupirocin ointment  1 application Nasal BID  . pantoprazole  40 mg Oral Daily  . potassium  chloride  20 mEq Oral BID     Infusions:     PRN Medications:  fentaNYL (SUBLIMAZE) injection     Assessment/Plan    1. HTN emergency - presented with atypical right shoulder pain and severe HTN SBPs in 200s - history of previous aortic dissection repair. CTA shows stable repair - on labetlolol for bp control, goal to low MAP 25% in first 24 hours - mild troponin elevation peak 0.17 trending down, probable related to HTN emergency. Symptoms not consistent with ACS. From prior testing moderate nonobstructive CAD. - resume home oral bp meds, he has been weaned off labetaolol drip - no plans for ischemic testing at this time. Troponins istat only, will add a lab troponin to AM labs.  - currently atypical chest pain right sided tender to palpation, noncardiac. Pain control per primary team.   2. History of aortic dissection repair - 09/2014 aortic valve conduit root replacement with coronary reconstruction with 36mm Sorin Mitroflow bovine pericardial conduit (Bentall procedure), followed by staged endovascular repair of descending thoracic aortic aneurysm. 01/2015 had left renal artery patch repair due to residual dissection. CTA shows no acute change  3. HTN - now off labetalol drip, follow bp's on  current regimen. - He is very defensive about being compliant with his home bp meds, we will need to see how bp does on regimen while admitted and make adjustments as needed.  - he feels very strongly that the reason his blood pressure was up was due to his pain alone. When I have examined him he has been sitting comfortably in no distress, I don't feel his level of pain would drive him SBP to the 200s alone.    [name], M.D.

## 2015-09-08 NOTE — Progress Notes (Signed)
Utilization Review Completed.[name] T6/25/2017  

## 2015-09-08 NOTE — Progress Notes (Signed)
PULMONARY / CRITICAL CARE MEDICINE   Name: [name] MRN: [id number] DOB: 1970/12/06    ADMISSION DATE:  09/07/2015  REFERRING MD:  Dr Audie Pinto  CHIEF COMPLAINT:  Chest pain  HISTORY OF PRESENT ILLNESS:   45 year old man with a history of chronic combined systolic and diastolic CHF, hypertension, type B aortic dissection in the setting of hypertensive emergency. He underwent aortic valve conduit root replacement with coronary reconstruction, then subsequently staged endovascular repair of the descending thoracic aortic aneurysm at [facility] in July 2016. Course complicated by renal failure. His last CT scan of the chest at this facility was January 2017 which showed stable postoperative changes, cardiomegaly trace peripheral effusions. He presented to the emergency department 09/07/15 with 24 hours of atypical chest pain. He was noted to be severely hypertensive with systolic blood pressures in the 200s. He had a slightly elevated troponin A CT scan of the chest has been performed data showed no acute findings without any evidence of acute dissection. The thoracic endograft was patent. There was a slight enlargement of the false lumen at the level of the chronic dissection. He was started on labetalol drip in the emergency department.   The patient reports he has had ongoing headaches for one month in the am.  He equates them to feeling like a "hangover" but he does not drink anymore.  He woke 3 days ago with right upper chest pain, back pain and then moved across his chest.  He reports some lightheadedness but no syncope or pre-syncope.  He denies changes in medications recently and endorses medication compliance.  NOTE he takes clonidine TID 0.2mg .  Pt denies ETOH use in the last year since his surgery.  He denies cocaine use.  Does smoke marijuana.   PCCM called for ICU admission.   SUBJECTIVE:  Pt reports feeling much better, hoping to go home.  Cardene gtt off since 0200.    VITAL SIGNS: BP  165/95 mmHg  Pulse 51  Temp(Src) 97.9 F (36.6 C) (Oral)  Resp 15  Ht 6' (1.829 m)  Wt 151 lb 7.3 oz (68.7 kg)  BMI 20.54 kg/m2  SpO2 97%  HEMODYNAMICS:    VENTILATOR SETTINGS:    INTAKE / OUTPUT: I/O last 3 completed shifts: In: 258.8 [I.V.:258.8] Out: 550 [Urine:550]  PHYSICAL EXAMINATION: General:  Thin adult male in NAD, lying in bed Neuro:  AAOx4, speech clear, MAE  HEENT:  MM pink/moist, mild JVD Cardiovascular:  s1s2 rrr, ? SEM 2-3/6 Lungs:  Even/non-labored on RA, lungs bilaterally coarse  Abdomen:  Flat/soft, bsx4 active, tolerating PO's  Musculoskeletal:  No acute deformities Skin:  Multiple tattoos, no rashes or lesions  LABS:  BMET  Recent Labs Lab 09/07/15 1215 09/08/15 0250  NA 139 135  K 3.8 3.4*  CL 106 103  CO2 25 26  BUN 11 13  CREATININE 1.41* 1.61*  GLUCOSE 84 88    Electrolytes  Recent Labs Lab 09/07/15 1215 09/08/15 0250  CALCIUM 9.6 8.8*    CBC  Recent Labs Lab 09/07/15 1215  WBC 4.9  HGB 13.2  HCT 41.3  PLT 111*    Coag's No results for input(s): APTT, INR in the last 168 hours.  Sepsis Markers No results for input(s): LATICACIDVEN, PROCALCITON, O2SATVEN in the last 168 hours.  ABG No results for input(s): PHART, PCO2ART, PO2ART in the last 168 hours.  Liver Enzymes No results for input(s): AST, ALT, ALKPHOS, BILITOT, ALBUMIN in the last 168 hours.  Cardiac Enzymes No results  for input(s): TROPONINI, PROBNP in the last 168 hours.  Glucose No results for input(s): GLUCAP in the last 168 hours.  Imaging Dg Chest 2 View  09/07/2015  CLINICAL DATA:  Shortness of breath with right-sided chest pain. EXAM: CHEST  2 VIEW COMPARISON:  06/03/2015 FINDINGS: Stable appearance of the thoracic aortic stent graft. Prior median sternotomy procedure with evidence of aortic valve and aortic root replacement. Surgical clips in the right upper axillary region. Both lungs are clear. Negative for a pneumothorax. Heart size is  mildly enlarged but unchanged. The trachea is midline. No large pleural effusions. No acute bone abnormality. IMPRESSION: No active cardiopulmonary disease. Stable postsurgical changes. Electronically Signed   By: Markus Daft M.D.   On: 09/07/2015 12:54   Ct Angio Chest/abd/pel For Dissection W And/or W/wo  09/07/2015  CLINICAL DATA:  Right upper chest pain, back pain and left lower quadrant abdominal pain. History of TEVAR to treat aortic dissection with known chronic abdominal aortic dissection. Hypertension. EXAM: CT ANGIOGRAPHY CHEST, ABDOMEN AND PELVIS TECHNIQUE: Multidetector CT imaging through the chest, abdomen and pelvis was performed using the standard protocol during bolus administration of intravenous contrast. Multiplanar reconstructed images and MIPs were obtained and reviewed to evaluate the vascular anatomy. CONTRAST:  100 mL Isovue 370 IV COMPARISON:  Multiple prior CTA examinations with the latest on 03/21/2015. FINDINGS: CTA CHEST FINDINGS Appearance of prosthetic aortic valve and thoracic aortic endograft are stable since prior CTA. Reimplanted proximal great vessels are normally patent. No evidence of acute dissection of the thoracic aorta. Mural thrombus adjacent to the descending thoracic endograft appears stable. There is stable perfusion of the distal aneurysm sac in the lower chest related to opacification of the false lumen of a chronic aortic dissection in the abdomen. Lungs show mild bibasilar atelectasis. No edema, infiltrate, nodule or pneumothorax is identified. No evidence of lymphadenopathy in the chest. Stable cardiac enlargement without pericardial fluid. Review of the MIP images confirms the above findings. CTA ABDOMEN AND PELVIS FINDINGS Just below the endograft, there is evidence of chronic dissection of the abdominal aorta. Maximal dimensions of the aorta are approximately 3.5 x 4.7 cm. This is minimally increased since the prior study where comparable dimensions were  approximately 3.4 x 4.4 cm. Slight increase in dimensions appear to be related to slight increase in size of the false lumen. The true lumen supplies widely patent superior mesenteric artery and right renal artery. A left renal artery stent off of the true lumen shows patency. The false lumen supplies the celiac axis. Below the level of the left renal artery, the dissection resolves and the rest of the abdominal aorta is normally patent. 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 Bilateral internal and external iliac arteries show normal patency. The common femoral arteries and femoral bifurcations are normally patent. No evidence of solid organ or bowel abnormality on the CTA study. No abnormal fluid collections. Bony structures show no abnormalities. Review of the MIP images confirms the above findings. IMPRESSION: No acute findings by CTA of the chest, abdomen and pelvis. There is no evidence of acute dissection and the thoracic endograft shows stable patency. Caliber of the abdominal aorta at the level of chronic dissection appears slightly larger by measurement due to slight expansion of the false lumen. Visceral arteries remain normally patent in the abdomen with the celiac axis supplied by the false lumen and other major artery supplied by the true lumen. Electronically  Signed   By: Aletta Edouard M.D.   On: 09/07/2015 15:41     STUDIES:  CT chest 6/24 >> no evidence of acute dissection thoracic endograft shows stable patency, caliber of the abdominal aorta at the level of the chronic dissection appears slightly larger than prior imaging UDS 6/24 >> positive for marijuana (known)  CULTURE: None  ANTIBIOTICS: None  SIGNIFICANT EVENTS: 6/24  Admit with hypertensive emergency   LINES/TUBES:  DISCUSSION: 45 year old man who presents with atypical chest pain and severe hypertension.  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 Coronary artery disease P:  Goal systolic blood pressure 0000000 Continue home clonidine, hydralazine, labetalol PO Continue atorvastatin ASA   RENAL A:   Chronic renal insufficiency stage II P:   Follow BMP after dye load Replace electrolytes as indicated   GASTROINTESTINAL A:   GERD P:   Pantoprazole by mouth Heart healthy diet as tolerated  HEMATOLOGIC A:   Thrombocytopenia  P:  Follow CBC  INFECTIOUS A:   No evidence of active infection P:   Follow clinically  ENDOCRINE A:   No acute issues P:    NEUROLOGIC A:   Headaches - suspect secondary to HTN P:   RASS goal: 0 BP control No neuro deficits to suggest need for imaging    FAMILY  - Updates:  Patient updated on plan of care.   - Inter-disciplinary family meet or Palliative Care meeting due by: 09/13/15   Tx to medical telemetry floor.  Likely discharge in am 6/26.    Noe Gens, NP-C [facility] Pgr: [phone] or if no answer [phone] 09/08/2015, 9:33 AM   Attending Note:  I have examined patient, reviewed labs, studies and notes. I have discussed the case with B Ollis, and I agree with the data and plans as amended above. 45 yo man with severe HTN and hx of aortic dissection with repair, presents with R chest pain, some HA, severe HTN. CT chest confirmed that his aortic repair is intact without evidence of any further aortic injury. Cardene weaned to off overnight. On my eval his BP is now at goal. He continues to have R chest to back pain that he worries is his aortic graft. Normal lung exam. We will continue his current HTN regimen, move to floor bed. May need to review case with  dr gerhardt who knows him regarding the R chest discomfort. All eval so far supports that the aortic graft and aorta are intact. Hopefully home on 6/26.    Baltazar Apo, MD, PhD 09/08/2015, 11:41 AM [facility] [phone] or if no answer [phone]

## 2015-09-08 NOTE — Progress Notes (Signed)
MD notified regarding patient's request to resume pain medications per home medication regimen.  Patient currently upset and threatening to leave AMA.  Family at bedside and trying to redirect patient.

## 2015-09-09 MED ORDER — HYDROCORTISONE ACETATE 25 MG RE SUPP: 25.0000 mg | Freq: Two times a day (BID) | RECTAL | Status: DC | PRN

## 2015-09-09 MED ORDER — CLONIDINE HCL 0.2 MG PO TABS: 0.2000 mg | ORAL_TABLET | Freq: Three times a day (TID) | ORAL | Status: DC

## 2015-09-09 NOTE — Care Management Important Message (Signed)
Important Message  Patient Details  Name: [name] MRN: [id number] Date of Birth: Sep 25, 1970   Medicare Important Message Given:  Yes    Loann Quill 09/09/2015, 9:01 AM

## 2015-09-09 NOTE — Progress Notes (Signed)
Pt provided with discharge instruction including information on follow up appointments as well as new medications. Pt verbalized understanding of all information. IV was dc'd without complication. Pt escorted out via wheelchair by hospital volunteer.

## 2015-09-09 NOTE — Discharge Summary (Signed)
Physician Discharge Summary  Patient ID: [name] MRN: [id number] DOB/AGE: January 31, 1971 45 y.o.  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 y/o male with a PMH of chronic combined systolic and diastolic CHF, hypertension, type B aortic dissection  in the setting of hypertensive emergency.  He underwent aortic valve conduit root replacement with coronary reconstruction, then subsequently staged endovascular repair of the descending thoracic aortic aneurysm at [facility] in July 2016.  He presented to the emergency department 09/07/15 with 24 hours of atypical chest pain. He was noted to be severely hypertensive with systolic blood pressures in the 200s. He had a slightly elevated troponin A CT scan of the chest has been performed data showed no acute findings without any evidence of acute dissection. The thoracic endograft was patent. There was a slight enlargement of the false lumen at the level of the chronic dissection. He was started on labetalol drip in the emergency department.  He was admitted to ICU, transitioned from labetalol to cardene gtt with improvement in BP.  Cardene gtt stopped and transitioned to his baseline oral  medications and BP remains wnl.  Troponin negative, CT chest/abd neg show stable repair of previous dissection.  On 6/26 pt awake, ambulating in room.  BP remains controlled on home regimen.  Pt is medically cleared for d/c home pending close outpt f/u.    Consults: Cardiology   Lines/tubes: none  Microbiology/Sepsis markers:   Significant Diagnostic Studies:  CT chest 6/24 >> no evidence of acute dissection thoracic endograft shows stable patency, caliber of the abdominal aorta at the level of the chronic dissection appears slightly larger than prior imaging UDS 6/24 >> positive for marijuana (known)    Filed Vitals:   09/08/15 1732 09/08/15 2150 09/09/15 0424 09/09/15 0740  BP: 162/99 152/81 128/64 143/80  Pulse: 62 61 63 59  Temp: 97.7 F (36.5 C) 98.6 F (37 C) 98.2 F (36.8 C) 98.5 F (36.9 C)  TempSrc: Oral Oral Oral Oral  Resp: '17  18 18  ' Height:      Weight:      SpO2: 100% 100% 100% 100%     Discharge Labs  BMET  Recent Labs Lab 09/07/15 1215 09/08/15 0250  NA 139 135  K 3.8 3.4*  CL 106 103  CO2 25 26  GLUCOSE 84 88  BUN 11 13  CREATININE 1.41* 1.61*  CALCIUM 9.6 8.8*     CBC   Recent Labs Lab 09/07/15 1215  HGB 13.2  HCT 41.3  WBC  4.9  PLT 111*   Anti-Coagulation No results for input(s): INR in the last 168 hours.          Follow-up Information    Follow up with [facility]. Schedule an appointment as soon as possible for a visit in 2 weeks.   Contact information:   [place] [id number] [phone]      Follow up with Norberto Sorenson T. Fuller Plan, MD.   Specialty:  Gastroenterology   Why:  as scheduled for colonoscopy    Contact information:   [address] [place] [address] [phone]       Schedule an appointment as soon as possible for a visit with [name], MD.   Specialty:  Cardiology   Contact information:   [address] [place] [address] [phone]          Medication List    STOP taking these medications        meclizine 25 MG tablet  Commonly known as:  ANTIVERT     morphine 15 MG tablet  Commonly known as:  MSIR     Na Sulfate-K Sulfate-Mg Sulf 17.5-3.13-1.6 GM/180ML Soln     pramoxine-hydrocortisone cream  Commonly known as:  ANALPRAM HC      TAKE these medications        acetaminophen 325 MG tablet  Commonly known as:  TYLENOL  Take 650 mg by mouth every 6 (six) hours as needed (pain).     acetaminophen-codeine 300-30 MG tablet  Commonly known as:  TYLENOL #3  Take 1 tablet by mouth every 8 (eight) hours as needed for moderate pain.     aspirin 81 MG tablet  Take 81 mg by mouth daily.     atorvastatin 20 MG tablet  Commonly known as:  LIPITOR  Take 20 mg by mouth daily.     cloNIDine 0.2 MG tablet  Commonly known as:  CATAPRES  Take 1 tablet (0.2 mg total) by mouth 3 (three) times daily.     dibucaine 1 % ointment  Commonly known as:  NUPERCAINAL  Apply topically 3 (three) times daily as needed for pain.     docusate sodium 100 MG capsule  Commonly known as:  COLACE  Take 1 capsule (100 mg total) by mouth 2 (two) times daily.     hydrALAZINE 50 MG tablet  Commonly known as:  APRESOLINE  Take 50 mg by mouth 2 (two) times daily.     hydrocortisone 25 MG suppository  Commonly known as:  ANUSOL-HC  Place 1 suppository (25 mg total) rectally 2 (two) times daily as needed for hemorrhoids.     labetalol 300 MG tablet  Commonly known as:  NORMODYNE  Take 2 tablets (600 mg total) by mouth 2 (two) times daily.     oxyCODONE 5 MG immediate release tablet  Commonly known as:  Oxy IR/ROXICODONE  Take 1-2 tablets (5-10 mg total) by mouth every 4 (four) hours as needed for moderate pain, severe pain or breakthrough pain.     pantoprazole 20 MG tablet  Commonly known as:  PROTONIX  Take 1 tablet (20 mg total) by mouth daily.          Disposition: 01-Home or  Self Care  Discharged Condition: [name] has met maximum benefit of inpatient care and is medically stable and cleared for discharge.  Patient is pending follow up as above.  Time spent on disposition:  Greater than 35 minutes.   SignedNickolas Madrid, NP 09/09/2015  10:44 AM Pager: [phone] or [phone]

## 2015-09-11 ENCOUNTER — Ambulatory Visit (AMBULATORY_SURGERY_CENTER): Payer: Medicare Other | Admitting: Gastroenterology

## 2015-09-11 ENCOUNTER — Encounter: Payer: Self-pay | Admitting: Gastroenterology

## 2015-09-11 VITALS — BP 111/61 | HR 62 | Temp 98.4°F | Resp 17 | Ht 71.0 in | Wt 152.0 lb

## 2015-09-11 DIAGNOSIS — D124 Benign neoplasm of descending colon: Secondary | ICD-10-CM | POA: Diagnosis not present

## 2015-09-11 DIAGNOSIS — K635 Polyp of colon: Secondary | ICD-10-CM | POA: Diagnosis not present

## 2015-09-11 DIAGNOSIS — K219 Gastro-esophageal reflux disease without esophagitis: Secondary | ICD-10-CM | POA: Diagnosis not present

## 2015-09-11 DIAGNOSIS — I10 Essential (primary) hypertension: Secondary | ICD-10-CM | POA: Diagnosis not present

## 2015-09-11 DIAGNOSIS — K921 Melena: Secondary | ICD-10-CM | POA: Diagnosis not present

## 2015-09-11 DIAGNOSIS — K625 Hemorrhage of anus and rectum: Secondary | ICD-10-CM | POA: Diagnosis not present

## 2015-09-11 DIAGNOSIS — D125 Benign neoplasm of sigmoid colon: Secondary | ICD-10-CM | POA: Diagnosis not present

## 2015-09-11 MED ORDER — SODIUM CHLORIDE 0.9 % IV SOLN: 500.0000 mL | INTRAVENOUS | Status: DC

## 2015-09-11 NOTE — Op Note (Signed)
[place] Patient Name: [name] Procedure Date: 09/11/2015 9:02 AM MRN: [id number] Endoscopist: Ladene Artist , MD Age: 45 Referring MD:  Date of Birth: 1970/12/13 Gender: Male Account #: [id number] Procedure:                Colonoscopy Indications:              Evaluation of unexplained GI bleeding, Hematochezia Medicines:                Monitored Anesthesia Care Procedure:                Pre-Anesthesia Assessment:                           - Prior to the procedure, a History and Physical                            was performed, and patient medications and                            allergies were reviewed. The patient's tolerance of                            previous anesthesia was also reviewed. The risks                            and benefits of the procedure and the sedation                            options and risks were discussed with the patient.                            All questions were answered, and informed consent                            was obtained. Prior Anticoagulants: The patient has                            taken no previous anticoagulant or antiplatelet                            agents. ASA Grade Assessment: III - A patient with                            severe systemic disease. After reviewing the risks                            and benefits, the patient was deemed in                            satisfactory condition to undergo the procedure.                           After obtaining informed consent, the colonoscope  was passed under direct vision. Throughout the                            procedure, the patient's blood pressure, pulse, and                            oxygen saturations were monitored continuously. The                            Model PCF-H190DL [id number]) scope was introduced                            through the anus and advanced to the the cecum,                            identified  by appendiceal orifice and ileocecal                            valve. The colonoscopy was somewhat difficult due                            to the bowel prep. The patient tolerated the                            procedure well. The quality of the bowel                            preparation was adequate after extensive rinsing                            and suctioning. The ileocecal valve, appendiceal                            orifice, and rectum were photographed. Scope In: 9:12:25 AM Scope Out: 9:38:56 AM Scope Withdrawal Time: 0 hours 21 minutes 16 seconds  Total Procedure Duration: 0 hours 26 minutes 31 seconds  Findings:                 The digital rectal exam was normal.                           Six sessile polyps were found in the sigmoid colon                            (4) and descending colon (2). The polyps were 6 to                            9 mm in size. These polyps were removed with a cold                            snare. Resection and retrieval were complete.                           Internal hemorrhoids  were found during                            retroflexion. The hemorrhoids were small and Grade                            I (internal hemorrhoids that do not prolapse).                           The exam was otherwise normal throughout the                            examined colon. Complications:            No immediate complications. Estimated Blood Loss:     Estimated blood loss was minimal. Impression:               - Six 6 to 9 mm polyps in the sigmoid colon and in                            the descending colon, removed with a cold snare.                            Resected and retrieved.                           - Internal hemorrhoids. Recommendation:           - Patient has a contact number available for                            emergencies. The signs and symptoms of potential                            delayed complications were discussed with the                             patient. Return to normal activities tomorrow.                            Written discharge instructions were provided to the                            patient.                           - Resume previous diet.                           - Continue present medications.                           - Await pathology results.                           - Repeat colonoscopy with a more extensive bowel  prep in 3 years for surveillance if polyp(s)                            precancerous, otherwise 5 years. Ladene Artist, MD 09/11/2015 9:45:37 AM This report has been signed electronically.

## 2015-09-11 NOTE — Progress Notes (Signed)
Called to room to assist during endoscopic procedure.  Patient ID and intended procedure confirmed with present staff. Received instructions for my participation in the procedure from the performing physician.  

## 2015-09-11 NOTE — Patient Instructions (Signed)
Discharge instructions given. Handouts on polyps and hemorrhoids. Resume previous medications. YOU HAD AN ENDOSCOPIC PROCEDURE TODAY AT THE [facility]:   Refer to the procedure report that was given to you for any specific questions about what was found during the examination.  If the procedure report does not answer your questions, please call your gastroenterologist to clarify.  If you requested that your care partner not be given the details of your procedure findings, then the procedure report has been included in a sealed envelope for you to review at your convenience later.  YOU SHOULD EXPECT: Some feelings of bloating in the abdomen. Passage of more gas than usual.  Walking can help get rid of the air that was put into your GI tract during the procedure and reduce the bloating. If you had a lower endoscopy (such as a colonoscopy or flexible sigmoidoscopy) you may notice spotting of blood in your stool or on the toilet paper. If you underwent a bowel prep for your procedure, you may not have a normal bowel movement for a few days.  Please Note:  You might notice some irritation and congestion in your nose or some drainage.  This is from the oxygen used during your procedure.  There is no need for concern and it should clear up in a day or so.  SYMPTOMS TO REPORT IMMEDIATELY:   Following lower endoscopy (colonoscopy or flexible sigmoidoscopy):  Excessive amounts of blood in the stool  Significant tenderness or worsening of abdominal pains  Swelling of the abdomen that is new, acute  Fever of 100F or higher   For urgent or emergent issues, a gastroenterologist can be reached at any hour by calling [phone].   DIET: Your first meal following the procedure should be a small meal and then it is ok to progress to your normal diet. Heavy or fried foods are harder to digest and may make you feel nauseous or bloated.  Likewise, meals heavy in dairy and vegetables can increase  bloating.  Drink plenty of fluids but you should avoid alcoholic beverages for 24 hours.  ACTIVITY:  You should plan to take it easy for the rest of today and you should NOT DRIVE or use heavy machinery until tomorrow (because of the sedation medicines used during the test).    FOLLOW UP: Our staff will call the number listed on your records the next business day following your procedure to check on you and address any questions or concerns that you may have regarding the information given to you following your procedure. If we do not reach you, we will leave a message.  However, if you are feeling well and you are not experiencing any problems, there is no need to return our call.  We will assume that you have returned to your regular daily activities without incident.  If any biopsies were taken you will be contacted by phone or by letter within the next 1-3 weeks.  Please call us at [phone] if you have not heard about the biopsies in 3 weeks.    SIGNATURES/CONFIDENTIALITY: You and/or your care partner have signed paperwork which will be entered into your electronic medical record.  These signatures attest to the fact that that the information above on your After Visit Summary has been reviewed and is understood.  Full responsibility of the confidentiality of this discharge information lies with you and/or your care-partner. 

## 2015-09-11 NOTE — Progress Notes (Signed)
Report to PACU, RN, vss, BBS= Clear.  

## 2015-09-12 ENCOUNTER — Telehealth: Payer: Self-pay | Admitting: *Deleted

## 2015-09-12 NOTE — Telephone Encounter (Signed)
  Follow up Call-  Call back number 09/11/2015 08/22/2015  Post procedure Call Back phone  # [phone]  Permission to leave phone message Yes Yes     Patient questions:  Do you have a fever, pain , or abdominal swelling? No. Pain Score  0 *  Have you tolerated food without any problems? Yes.    Have you been able to return to your normal activities? Yes.    Do you have any questions about your discharge instructions: Diet   No. Medications  No. Follow up visit  No.  Do you have questions or concerns about your Care? No.  Actions: * If pain score is 4 or above: No action needed, pain <4.

## 2015-09-22 ENCOUNTER — Encounter: Payer: Self-pay | Admitting: Gastroenterology

## 2015-10-13 ENCOUNTER — Encounter (HOSPITAL_COMMUNITY): Payer: Self-pay

## 2015-10-13 ENCOUNTER — Emergency Department (HOSPITAL_COMMUNITY)
Admission: EM | Admit: 2015-10-13 | Discharge: 2015-10-13 | Disposition: A | Discharge status: Home / Self Care | Payer: Medicare Other | Attending: Emergency Medicine | Admitting: Emergency Medicine

## 2015-10-13 ENCOUNTER — Emergency Department (HOSPITAL_COMMUNITY): Payer: Medicare Other

## 2015-10-13 DIAGNOSIS — R0602 Shortness of breath: Secondary | ICD-10-CM | POA: Diagnosis not present

## 2015-10-13 DIAGNOSIS — R1084 Generalized abdominal pain: Secondary | ICD-10-CM | POA: Diagnosis not present

## 2015-10-13 DIAGNOSIS — R0789 Other chest pain: Secondary | ICD-10-CM | POA: Insufficient documentation

## 2015-10-13 DIAGNOSIS — N183 Chronic kidney disease, stage 3 (moderate): Secondary | ICD-10-CM | POA: Insufficient documentation

## 2015-10-13 DIAGNOSIS — R51 Headache: Secondary | ICD-10-CM | POA: Diagnosis not present

## 2015-10-13 DIAGNOSIS — R109 Unspecified abdominal pain: Secondary | ICD-10-CM

## 2015-10-13 DIAGNOSIS — I7100 Dissection of unspecified site of aorta: Secondary | ICD-10-CM

## 2015-10-13 DIAGNOSIS — Z955 Presence of coronary angioplasty implant and graft: Secondary | ICD-10-CM | POA: Diagnosis not present

## 2015-10-13 DIAGNOSIS — Z79899 Other long term (current) drug therapy: Secondary | ICD-10-CM | POA: Insufficient documentation

## 2015-10-13 DIAGNOSIS — I5042 Chronic combined systolic (congestive) and diastolic (congestive) heart failure: Secondary | ICD-10-CM | POA: Insufficient documentation

## 2015-10-13 DIAGNOSIS — I2510 Atherosclerotic heart disease of native coronary artery without angina pectoris: Secondary | ICD-10-CM | POA: Insufficient documentation

## 2015-10-13 DIAGNOSIS — Z7982 Long term (current) use of aspirin: Secondary | ICD-10-CM | POA: Diagnosis not present

## 2015-10-13 DIAGNOSIS — I7102 Dissection of abdominal aorta: Secondary | ICD-10-CM | POA: Diagnosis not present

## 2015-10-13 DIAGNOSIS — F17210 Nicotine dependence, cigarettes, uncomplicated: Secondary | ICD-10-CM | POA: Diagnosis not present

## 2015-10-13 DIAGNOSIS — I130 Hypertensive heart and chronic kidney disease with heart failure and stage 1 through stage 4 chronic kidney disease, or unspecified chronic kidney disease: Secondary | ICD-10-CM | POA: Insufficient documentation

## 2015-10-13 DIAGNOSIS — R079 Chest pain, unspecified: Secondary | ICD-10-CM | POA: Diagnosis not present

## 2015-10-13 DIAGNOSIS — M549 Dorsalgia, unspecified: Secondary | ICD-10-CM

## 2015-10-13 LAB — I-STAT CHEM 8, ED
BUN: 10 mg/dL (ref 6–20)
CALCIUM ION: 1.21 mmol/L (ref 1.13–1.30)
CREATININE: 1.3 mg/dL — AB (ref 0.61–1.24)
Chloride: 102 mmol/L (ref 101–111)
GLUCOSE: 77 mg/dL (ref 65–99)
HCT: 42 % (ref 39.0–52.0)
Hemoglobin: 14.3 g/dL (ref 13.0–17.0)
Potassium: 4.5 mmol/L (ref 3.5–5.1)
SODIUM: 141 mmol/L (ref 135–145)
TCO2: 26 mmol/L (ref 0–100)

## 2015-10-13 LAB — COMPREHENSIVE METABOLIC PANEL
ALT: 37 U/L (ref 17–63)
AST: 28 U/L (ref 15–41)
Albumin: 3.7 g/dL (ref 3.5–5.0)
Alkaline Phosphatase: 95 U/L (ref 38–126)
Anion gap: 8 (ref 5–15)
BUN: 9 mg/dL (ref 6–20)
CHLORIDE: 103 mmol/L (ref 101–111)
CO2: 26 mmol/L (ref 22–32)
CREATININE: 1.27 mg/dL — AB (ref 0.61–1.24)
Calcium: 9.3 mg/dL (ref 8.9–10.3)
GFR calc Af Amer: >60 mL/min (ref >60)
GLUCOSE: 82 mg/dL (ref 65–99)
Potassium: 4.4 mmol/L (ref 3.5–5.1)
Sodium: 137 mmol/L (ref 135–145)
Total Bilirubin: 0.4 mg/dL (ref 0.3–1.2)
Total Protein: 7.5 g/dL (ref 6.5–8.1)

## 2015-10-13 LAB — BASIC METABOLIC PANEL
Anion gap: 6 (ref 5–15)
BUN: 8 mg/dL (ref 6–20)
CALCIUM: 9.2 mg/dL (ref 8.9–10.3)
CO2: 24 mmol/L (ref 22–32)
Chloride: 107 mmol/L (ref 101–111)
Creatinine, Ser: 1.24 mg/dL (ref 0.61–1.24)
GFR calc Af Amer: >60 mL/min (ref >60)
GLUCOSE: 84 mg/dL (ref 65–99)
Potassium: 4.1 mmol/L (ref 3.5–5.1)
Sodium: 137 mmol/L (ref 135–145)

## 2015-10-13 LAB — I-STAT TROPONIN, ED
TROPONIN I, POC: 0.02 ng/mL (ref 0.00–0.08)
Troponin i, poc: 0.03 ng/mL (ref 0.00–0.08)

## 2015-10-13 LAB — CBC
HEMATOCRIT: 40.4 % (ref 39.0–52.0)
Hemoglobin: 13.3 g/dL (ref 13.0–17.0)
MCH: 30.6 pg (ref 26.0–34.0)
MCHC: 32.9 g/dL (ref 30.0–36.0)
MCV: 92.9 fL (ref 78.0–100.0)
Platelets: 195 10*3/uL (ref 150–400)
RBC: 4.35 MIL/uL (ref 4.22–5.81)
RDW: 15.2 % (ref 11.5–15.5)
WBC: 6.9 10*3/uL (ref 4.0–10.5)

## 2015-10-13 LAB — URINALYSIS, ROUTINE W REFLEX MICROSCOPIC
BILIRUBIN URINE: NEGATIVE
GLUCOSE, UA: NEGATIVE mg/dL
HGB URINE DIPSTICK: NEGATIVE
KETONES UR: NEGATIVE mg/dL
Leukocytes, UA: NEGATIVE
Nitrite: NEGATIVE
PH: 6 (ref 5.0–8.0)
Protein, ur: 100 mg/dL — AB
Specific Gravity, Urine: 1.017 (ref 1.005–1.030)

## 2015-10-13 LAB — URINE MICROSCOPIC-ADD ON
RBC / HPF: NONE SEEN RBC/hpf (ref 0–5)
WBC, UA: NONE SEEN WBC/hpf (ref 0–5)

## 2015-10-13 LAB — I-STAT CG4 LACTIC ACID, ED: Lactic Acid, Venous: 1.22 mmol/L (ref 0.5–1.9)

## 2015-10-13 LAB — LIPASE, BLOOD: LIPASE: 18 U/L (ref 11–51)

## 2015-10-13 MED ORDER — FENTANYL CITRATE (PF) 100 MCG/2ML IJ SOLN
100.0000 ug | INTRAMUSCULAR | Status: DC | PRN
  Administered 2015-10-13: 100 ug via INTRAVENOUS
  Filled 2015-10-13: qty 2

## 2015-10-13 MED ORDER — OXYCODONE-ACETAMINOPHEN 5-325 MG PO TABS
2.0000 | ORAL_TABLET | Freq: Once | ORAL | Status: AC
  Administered 2015-10-13: 2 via ORAL
  Filled 2015-10-13: qty 2

## 2015-10-13 MED ORDER — SODIUM CHLORIDE 0.9 % IV BOLUS (SEPSIS)
1000.0000 mL | Freq: Once | INTRAVENOUS | Status: AC
Start: 2015-10-13 — End: 2015-10-13
  Administered 2015-10-13: 1000 mL via INTRAVENOUS

## 2015-10-13 MED ORDER — HYDRALAZINE HCL 20 MG/ML IJ SOLN
20.0000 mg | Freq: Once | INTRAMUSCULAR | Status: AC
  Administered 2015-10-13: 20 mg via INTRAVENOUS
  Filled 2015-10-13: qty 1

## 2015-10-13 MED ORDER — IOPAMIDOL (ISOVUE-370) INJECTION 76%
INTRAVENOUS | Status: AC
  Administered 2015-10-13: 100 mL
  Filled 2015-10-13: qty 100

## 2015-10-13 MED ORDER — SODIUM CHLORIDE 0.9 % IV SOLN: Freq: Once | INTRAVENOUS | Status: DC

## 2015-10-13 NOTE — Discharge Instructions (Signed)
Continue your follow up with your primary care physician.  Continue your medications at their current dosages.

## 2015-10-13 NOTE — ED Triage Notes (Signed)
Patient here with chest pain x 1 week, states that the pain is more epigastric, non-radiating. Also complains of bilateral leg weakness and left ear swelling, NAD

## 2015-10-13 NOTE — ED Notes (Signed)
PT returned from CT

## 2015-10-13 NOTE — ED Provider Notes (Signed)
[facility] DEPT Provider Note   CSN: [id number] Arrival date & time: 10/13/15  1146  First Provider Contact:  First MD Initiated Contact with Patient 10/13/15 1502        History   Chief Complaint Chief Complaint  Patient presents with  . Chest Pain    HPI [name] is a 45 y.o. male.  He is a significant history of aortic dissection. Also a requiring aortic arch graft implant, aortic valve replacement. This was done at [facility]. He continues to have rather frequent episodes of chest and abdominal pain. History of cocaine use. Denies any recently. Current symptoms started 1 week ago and have been constant. He primarily describes epigastric pain. He states at times it hurts as far up as his ear or to his right leg. States typically Tylenol at home will help and it has not been working this week.  No shortness of breath. No cough. No nausea vomiting or GI complaints.  HPI  Past Medical History:  Diagnosis Date  . Anxiety   . Aortic disease ([place])   . Aortic dissection (HCC)    a. admx 04/2014 >> L renal infarct; a/c renal failure >> b.  s/p Bioprosthetic Bentall and total arch replacement and staged endovascular repair of descending aortic aneurysm ([facility] - Dr. [name])  . CAD (coronary artery disease)    a. LHC 4/16:  oD1 60%  . Cardiomyopathy ([place])    a. non-ischemic - probably related to untreated HTN and ETOH abuse - Echo 3/13 with EF 35-40% >> b. Echo 4/16: Severe LVH, EF 55-60%, moderate AI, moderate MR, mild LAE, trivial effusion, known type B dissection with communication between true and false lumens with suprasternal images suggesting dissection plane may propagate to at least left subclavian takeoff, root above aortic valve okay    . Chronic abdominal pain   . Chronic combined systolic and diastolic congestive heart failure ([place])    a. 05/2011: Adm with pulm edema/HTN urgency, EF 35-40% with diffuse hypokinesis and moderate to severe mitral regurgitation. Cardiomyopathy  likely due to uncontrolled HTN and ETOH abuse - cath deferred due to renal insufficiency (felt due to uncontrolled HTN). bJodie Echevaria MV 06/2011: EF 37% and no ischemia or infarction. c. EF 45-50% by echo 01/2012.  [name] Chronic sinusitis   . CKD (chronic kidney disease)    a. 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  peak creatinine 3.46 during admx for aortic dissection 2/16.  sees Dr [name]  . DDD (degenerative disc disease), lumbar   . Descending thoracic aortic aneurysm ([place])   . Dissecting aneurysm of thoracic aorta ([place])   . ETOH abuse    a. Reported to have quit 05/2011.  [name] GERD (gastroesophageal reflux disease)   . Headache(784.0)    "q other day" (08/08/2013)  . Heart murmur   . Hemorrhoid thrombosis   . History of echocardiogram    Echo 1/17:  Severe LVH, EF 55-60%, no RWMA, Gr 2 DD, AVR ok, mild to mod MR, mild LAE, mild reduced RVSF, mod RAE  . History of medication noncompliance   . HYPERLIPIDEMIA   . Hypertension    a. Hx of HTN urgency secondary to noncompliance. b. urinary metanephrine and catecholeamine levels normal 2013.  c. Renal art [name] 1/16:  No evidence of renal artery stenosis noted bilaterally.  . INGUINAL HERNIA   . Pneumonia ~ 2013  . Renal insufficiency   . Tobacco abuse   . Valvular heart disease    a. Echo 05/2011: moderate  to severe eccentric MR and mild to moderate AI with prolapsing left coronary cusp. b. Echo 01/2012: mild-mod AI, mild dilitation of aortic root, mild MR.;  c. Echo 1/16: Severe LVH consistent with hypertrophic cardio myopathy, EF 50%, no RWMA, mod AI, mild MR, mild RAE, dilated Ao root (40 mm);     Patient Active Problem List   Diagnosis Date Noted  . HLD (hyperlipidemia) 08/28/2015  . Thrombosed hemorrhoids 06/14/2015  . Rectal bleeding 06/14/2015  . Rectal pain 06/14/2015  . 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[place]) 06/07/2015  . Cardiomyopathy ([place]) 03/01/2015  . Acid reflux 03/01/2015  . BP (high blood pressure) 03/01/2015  . Cardiac chest  pain 12/10/2014  . Abdominal pain 10/12/2014  . Injury of kidney 10/03/2014  . History of aortic arch replacement 10/03/2014  . H/O aortic valve replacement 10/03/2014  . Pre-op testing 06/15/2014  . CKD (chronic kidney disease) 05/15/2014  . Hypoxia   . AKI (acute kidney injury) ([place])   . Aortic dissection, thoracoabdominal ([place])   . 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 Aortic dissection, thoracic ([place]) 04/24/2014  . Aortic dissection ([place]) 04/23/2014  . Aneurysm of ascending aorta (HCC) 03/28/2014  . Dyspnea 03/27/2014  . Prolonged Q-T interval on ECG 03/27/2014  . ETOH abuse 09/20/2013  . Hypertensive crisis 08/08/2013  . Sinusitis, bacterial 08/08/2013  . Malignant hypertension 02/20/2013  . Hypertensive urgency 01/27/2012  . Cardiomyopathy, hypertensive ([place]) 01/26/2012  . AI (aortic insufficiency) 01/26/2012  . MR (mitral regurgitation) 01/26/2012  . Acute renal failure superimposed on stage 3 chronic kidney disease ([place]) 01/26/2012  . Systolic CHF, chronic ([place]) 06/10/2011  . Chronic bronchitis 05/23/2011  . Hypertensive emergency 05/22/2011  . Cardiomegaly - hypertensive 05/22/2011  . Tobacco abuse 05/22/2011  . Marijuana use 05/22/2011  . Abdominal pain 05/22/2011  . Hypertension, malignant 05/22/2011  . Anxiety and depression 05/22/2011  . Hyperlipidemia 08/26/2009  . Tobacco use 08/26/2009  . Headache(784.0) 08/26/2009  . Aortic valve disorder 03/11/2009  . INGUINAL HERNIA 02/18/2009  . Uncontrolled hypertension 01/16/2009  . MURMUR 01/16/2009    Past Surgical History:  Procedure Laterality Date  . ANKLE SURGERY Bilateral    Fractures bilaterally  . AORTIC VALVE SURGERY  09/2014  . FOOT FRACTURE SURGERY Bilateral 2004-2010   "got pins in both of them"  . HEMORRHOID SURGERY N/A 06/15/2015   Procedure: HEMORRHOIDECTOMY;  Surgeon: Stark Klein, MD;  Location: [facility];  Service: General;  Laterality: N/A;  . INGUINAL HERNIA  REPAIR Right ~ 1996  . LEFT HEART CATHETERIZATION WITH CORONARY ANGIOGRAM N/A 06/21/2014   Procedure: LEFT HEART CATHETERIZATION WITH CORONARY ANGIOGRAM;  Surgeon: Larey Dresser, MD;  Location: [facility] CATH LAB;  Service: Cardiovascular;  Laterality: N/A;       Home Medications    Prior to Admission medications   Medication Sig Start Date End Date Taking? Authorizing Provider  acetaminophen (TYLENOL) 325 MG tablet Take 650 mg by mouth every 6 (six) hours as needed (pain).   Yes Historical Provider, MD  acetaminophen-codeine (TYLENOL #3) 300-30 MG tablet Take 1 tablet by mouth every 8 (eight) hours as needed for moderate pain. 06/13/15  Yes Ladene Artist, MD  aspirin 81 MG tablet Take 81 mg by mouth daily.   Yes Historical Provider, MD  atorvastatin (LIPITOR) 20 MG tablet Take 20 mg by mouth daily. 02/07/15  Yes Historical Provider, MD  cloNIDine (CATAPRES) 0.2 MG tablet Take 1 tablet (0.2 mg total) by mouth 3 (three) times  daily. 09/09/15  Yes Marijean Heath, NP  dibucaine (NUPERCAINAL) 1 % ointment Apply topically 3 (three) times daily as needed for pain. 06/15/15  Yes Stark Klein, MD  hydrALAZINE (APRESOLINE) 50 MG tablet Take 50 mg by mouth 2 (two) times daily. 12/04/14  Yes Historical Provider, MD  hydrocortisone (ANUSOL-HC) 25 MG suppository Place 1 suppository (25 mg total) rectally 2 (two) times daily as needed for hemorrhoids. 09/09/15  Yes Marijean Heath, NP  labetalol (NORMODYNE) 300 MG tablet Take 2 tablets (600 mg total) by mouth 2 (two) times daily. 12/14/14  Yes Liliane Shi, PA-C  Multiple Vitamins-Minerals (MULTIVITAMIN WITH MINERALS) tablet Take 1 tablet by mouth daily.   Yes Historical Provider, MD  pantoprazole (PROTONIX) 20 MG tablet Take 1 tablet (20 mg total) by mouth daily. 06/03/15  Yes Margarita Mail, PA-C    Family History Family History  Problem Relation Age of Onset  . Hypertension    . Colon cancer Paternal Uncle   . Stroke Maternal Aunt   . Heart  attack Brother   . Diabetes Maternal Aunt   . Throat cancer Neg Hx   . Pancreatic cancer Neg Hx   . Esophageal cancer Neg Hx   . Kidney disease Neg Hx   . Liver disease Neg Hx     Social History Social History  Substance Use Topics  . Smoking status: Current Every Day Smoker    Packs/day: 0.25    Years: 23.00    Types: Cigarettes  . Smokeless tobacco: Former Systems developer    Types: Chew     Comment: as of 05-22-14 down to 3-4 cigs daily- 06/14/15- 4 cigs  . Alcohol use No     Comment: + previous use     Allergies   Imdur [isosorbide nitrate] and Tramadol   Review of Systems Review of Systems  Constitutional: Negative for appetite change, chills, diaphoresis, fatigue and fever.  HENT: Negative for mouth sores, sore throat and trouble swallowing.   Eyes: Negative for visual disturbance.  Respiratory: Positive for chest tightness. Negative for cough, shortness of breath and wheezing.   Cardiovascular: Positive for chest pain.  Gastrointestinal: Positive for abdominal pain. Negative for abdominal distention, diarrhea, nausea and vomiting.  Endocrine: Negative for polydipsia, polyphagia and polyuria.  Genitourinary: Negative for dysuria, frequency and hematuria.  Musculoskeletal: Negative for gait problem.  Skin: Negative for color change, pallor and rash.  Neurological: Positive for headaches. Negative for dizziness, syncope and light-headedness.  Hematological: Does not bruise/bleed easily.  Psychiatric/Behavioral: Negative for behavioral problems and confusion.     Physical Exam Updated Vital Signs BP 157/91   Pulse 72   Temp 98.1 F (36.7 C) (Oral)   Resp 20   SpO2 98%   Physical Exam  Constitutional: He is oriented to person, place, and time. He appears well-developed and well-nourished. No distress.  HENT:  Head: Normocephalic.  Eyes: Conjunctivae are normal. Pupils are equal, round, and reactive to light. No scleral icterus.  Neck: Normal range of motion. Neck  supple. No thyromegaly present.  Cardiovascular: Normal rate and regular rhythm.  Exam reveals no gallop and no friction rub.   No murmur heard. Clinic for mechanical valve noted. No additional murmurs noted.  Strong femoral pulses. Normal perfusion lower extremity is Refill 2-3 seconds. No paresis. Normal movement. Strong carotids. Normal symmetric pulses to the bilateral upper extremities.  Pulmonary/Chest: Effort normal and breath sounds normal. No respiratory distress. He has no wheezes. He has no rales.  Abdominal: Soft.  Bowel sounds are normal. He exhibits no distension. There is no tenderness. There is no rebound.  Musculoskeletal: Normal range of motion.  Neurological: He is alert and oriented to person, place, and time.  Skin: Skin is warm and dry. No rash noted.  Psychiatric: He has a normal mood and affect. His behavior is normal.     ED Treatments / Results  Labs (all labs ordered are listed, but only abnormal results are displayed) Labs Reviewed  COMPREHENSIVE METABOLIC PANEL - Abnormal; Notable for the following:       Result Value   Creatinine, Ser 1.27 (*)    All other components within normal limits  URINALYSIS, ROUTINE W REFLEX MICROSCOPIC (NOT AT [facility]) - Abnormal; Notable for the following:    Protein, ur 100 (*)    All other components within normal limits  URINE MICROSCOPIC-ADD ON - Abnormal; Notable for the following:    Squamous Epithelial / LPF 0-5 (*)    Bacteria, UA RARE (*)    All other components within normal limits  I-STAT CHEM 8, ED - Abnormal; Notable for the following:    Creatinine, Ser 1.30 (*)    All other components within normal limits  BASIC METABOLIC PANEL  CBC  LIPASE, BLOOD  I-STAT TROPOININ, ED  I-STAT TROPOININ, ED  I-STAT CG4 LACTIC ACID, ED    EKG  EKG Interpretation  Date/Time:  Sunday October 13 2015 15:00:28 EDT Ventricular Rate:  66 PR Interval:    QRS Duration: 98 QT Interval:  436 QTC Calculation: 457 R  Axis:   36 Text Interpretation:  Sinus rhythm Left atrial enlargement LVH with secondary repolarization abnormality Anterior ST elevation, probably due to LVH Confirmed by Jeneen Rinks  MD, [place] ([id number]) on 10/13/2015 4:07:19 PM       Radiology Dg Chest 2 View  Result Date: 10/13/2015 CLINICAL DATA:  Shortness of breath. General chest pain. Prior treatment for thoracic aortic dissection. EXAM: CHEST  2 VIEW COMPARISON:  09/07/2015 FINDINGS: Again needed are postsurgical changes with an aortic stent graft involving the distal ascending thoracic aorta, aortic arch and descending thoracic aorta. Additional postsurgical changes compatible with aortic valve surgery and great vessel bypass procedure. Again noted are surgical clips in the right axilla. Both lungs are clear. No pulmonary edema. Heart size is stable and within normal limits. No pleural effusions. IMPRESSION: No active cardiopulmonary disease.  Stable postsurgical changes. Electronically Signed   By: Markus Daft M.D.   On: 10/13/2015 13:05  Ct Angio Chest/abd/pel For Dissection W And/or W/wo  Result Date: 10/13/2015 CLINICAL DATA:  History of dissection repair in 2016 with shortness of breath and chest pain EXAM: CT ANGIOGRAPHY CHEST, ABDOMEN AND PELVIS TECHNIQUE: Multidetector CT imaging through the chest, abdomen and pelvis was performed using the standard protocol during bolus administration of intravenous contrast. Multiplanar reconstructed images and MIPs were obtained and reviewed to evaluate the vascular anatomy. CONTRAST:  100 mL Isovue 370. COMPARISON:  None. FINDINGS: CTA CHEST FINDINGS Vascular: The thoracic aorta demonstrates postsurgical changes with origin of the brachiocephalic vessels from the most proximal aspect of the ascending aorta. Stent graft is noted within almost the entire residual thoracic aorta. Aortic valve replacement is noted. The majority of the false lumen in the descending aorta is thrombosed. Some minimal per fusion is  again noted distally stable from the prior exam. Pulmonary artery is visualize is within normal limits. No evidence of pulmonary emboli is noted. Cardiac structures are stable. Nonvascular: Lungs are well aerated bilaterally with  exception of mild left lower lobe atelectasis along the descending aorta. This is stable from the prior exam. No focal parenchymal nodule is seen. The thoracic inlet is within normal limits. No acute bony abnormality is seen. No significant hilar or mediastinal adenopathy is noted. Review of the MIP images confirms the above findings. CTA ABDOMEN AND PELVIS FINDINGS Vascular: The abdominal aorta again demonstrates findings of dissection similar to that seen on the prior exam with the dissection terminating just below the left renal artery similar to that seen on the prior study. The celiac axis does perfuse from the false lumen. The maximum dimensions of the aorta at the level of the celiac axis measure 4.6 x 3.3 cm which are roughly stable from the prior exam. The superior mesenteric artery, inferior mesenteric artery and renal arteries both arise from the true lumen. Left renal artery stent is noted and patent. The more distal aorta demonstrates calcific changes although no evidence of dissection is noted. The iliac arteries and femoral arteries are stable from the prior exam and widely patent. Mild aneurysmal dilatation of the left common iliac artery is again seen. Nonvascular: The liver, spleen, pancreas, adrenal glands and gallbladder are within normal limits. Kidneys are well visualized bilaterally within normal enhancement pattern with the exception of some mild scarring of the left kidney anteriorly. This is stable from the prior study. The bladder is partially distended. No pelvic mass lesion is seen. The appendix is not well visualized although no inflammatory changes are seen. The osseous structures show no acute abnormality. Review of the MIP images confirms the above findings.  IMPRESSION: Stable changes of re- implantation of the brachiocephalic vessels and stent graft placement of almost the entire residual thoracic aorta. Aortic valve replacement is noted as well. No significant interval change is seen from the prior study. There is persistent opacification of the distal aspect of the false lumen which allows for perfusion of the celiac axis. This is stable in appearance. Extension of the dissection into the abdominal aorta is stable from the prior study. All visceral vessels with the exception of the celiac axis arise from the true lumen. No acute abnormality is noted. No significant nonvascular abnormality is noted. Electronically Signed   By: Inez Catalina M.D.   On: 10/13/2015 17:20   Procedures Procedures (including critical care time)  Medications Ordered in ED Medications  0.9 %  sodium chloride infusion (not administered)  fentaNYL (SUBLIMAZE) injection 100 mcg (100 mcg Intravenous Given 10/13/15 1549)  oxyCODONE-acetaminophen (PERCOCET/ROXICET) 5-325 MG per tablet 2 tablet (not administered)  sodium chloride 0.9 % bolus 1,000 mL (1,000 mLs Intravenous New Bag/Given 10/13/15 1551)  hydrALAZINE (APRESOLINE) injection 20 mg (20 mg Intravenous Given 10/13/15 1549)  iopamidol (ISOVUE-370) 76 % injection (100 mLs  Contrast Given 10/13/15 1629)     Initial Impression / Assessment and Plan / ED Course  I have reviewed the triage vital signs and the nursing notes.  Pertinent labs & imaging results that were available during my care of the patient were reviewed by me and considered in my medical decision making (see chart for details).  Clinical Course    CT angiogram shows patent celiac axis through his false lumen. Remainder of vessels patent via true lumen. Graft appears intact. No significant changes in dissection or graft for perfusion. Negative troponin, unchanged EKG, normal lipase and hepatobiliary enzymes.  As I speak with Peterson Ao sounds like he has pain most  days. Sounds like some days he does get somewhat anxious  and panicked. Understandable for his history. Adamantly denies drug use. Appropriate for discharge home at this time. Primary care follow-up.  Final Clinical Impressions(s) / ED Diagnoses   Final diagnoses:  Generalized abdominal pain    New Prescriptions New Prescriptions   No medications on file     Tanna Furry, MD 10/13/15 1743

## 2015-10-13 NOTE — ED Notes (Signed)
Pt went to CT

## 2015-10-21 ENCOUNTER — Telehealth: Payer: Self-pay | Admitting: Gastroenterology

## 2015-10-21 NOTE — Telephone Encounter (Signed)
Patient with abdominal pain left sided. Reports has been present since his colonoscopy in June.  He was evaluated in the ED and no cause noted for his pain.  He has follow up with vascular surgeon in September, they asked that he see GI prior to him coming back there.  He will come in and see Nicoletta Ba PA on 10/23/15 3:00

## 2015-10-23 ENCOUNTER — Encounter (INDEPENDENT_AMBULATORY_CARE_PROVIDER_SITE_OTHER): Payer: Self-pay

## 2015-10-23 ENCOUNTER — Ambulatory Visit (INDEPENDENT_AMBULATORY_CARE_PROVIDER_SITE_OTHER): Payer: Medicare Other | Admitting: Physician Assistant

## 2015-10-23 ENCOUNTER — Encounter: Payer: Self-pay | Admitting: *Deleted

## 2015-10-23 VITALS — BP 198/102 | HR 80 | Ht 71.0 in | Wt 151.4 lb

## 2015-10-23 DIAGNOSIS — R1012 Left upper quadrant pain: Secondary | ICD-10-CM | POA: Diagnosis not present

## 2015-10-23 NOTE — Patient Instructions (Signed)
Use Tylenol as needed for pain.  Make an appointment to see Dr. Aundra Dubin, your cardiologist,or Richardson Dopp PA-C. Keep appointment at [facility].   Your follow up colonoscopy is with Dr. Fuller Plan in 2020.

## 2015-10-23 NOTE — Progress Notes (Signed)
Subjective:    Patient ID: [name], male    DOB: 1970-07-02, 45 y.o.   MRN: [id number]  HPI  [name] is a pleasant 45 year old African-American male known to Dr. Fuller Plan. He comes in today with complaints of some recent left-sided abdominal discomfort. Patient has a complicated past medical history with history of congestive heart failure, cardiomyopathy felt due to uncontrolled hypertension and EtOH abuse in the past. He also has severe hypertension, and underwent surgery about a year ago for dissection of the ascending aortic aneurysm area did he had a stent graft done at [facility]. He also has known mitral regurgitation and aortic insufficiency, continued tobacco abuse, chronic kidney disease. Patient has history of large hemorrhoids and underwent excision of prolapsed thrombosed internal hemorrhoids 3 and also excision of large external hemorrhoids 2 in March 2017. He had an admission in June with chest pain and hypertension emergency. The patient underwent colonoscopy with Dr. Fuller Plan on 09/11/2015 with finding of 6 polyps these were 6-9 mm in size all removed and was noted to have internal hemorrhoids. 2 of the polyps were sessile serrated adenomas in the others were hyperplastic. He was advised to have 3 year interval follow-up. Had an ER visit on July 30 with complaints of chest pain and underwent CT angio of the chest abdomen and pelvis that showed his graft to be intact, with stable changes from reimplantation of the brachiocephalic vessels and stent graft placement of almost the entire residual aorta .There was no non-vascular abnormality noted. Patient says he has had intermittent left upper abdominal discomfort over the past month. He says actually over the past few days since he made this appointment pain has resolved. He had been having pain which seemed to be exacerbated by going from a lying position to standing. He says if he has to stand for long periods of time he would get a  pulling sensation in his left mid and left upper quadrant. Not had any fever or chills,. Appetite has been fair. He notes no change in his discomfort with by mouth intake ,and no nausea or vomiting.. Bowel movements have been normal is been no melena or hematochezia. He had been taking Tylenol for his pain which she said was helpful. He has been worried because pain started shortly after he had colonoscopy and was concerned that this might somehow be related to something inside his abdomen.  Review of Systems Pertinent positive and negative review of systems were noted in the above HPI section.  All other review of systems was otherwise negative.  Outpatient Encounter Prescriptions as of 10/23/2015  Medication Sig  . acetaminophen (TYLENOL) 325 MG tablet Take 650 mg by mouth every 6 (six) hours as needed (pain).  [name] acetaminophen-codeine (TYLENOL #3) 300-30 MG tablet Take 1 tablet by mouth every 8 (eight) hours as needed for moderate pain.  [name] aspirin 81 MG tablet Take 81 mg by mouth daily.  [name] atorvastatin (LIPITOR) 20 MG tablet Take 20 mg by mouth daily.  . cloNIDine (CATAPRES) 0.2 MG tablet Take 1 tablet (0.2 mg total) by mouth 3 (three) times daily.  . hydrALAZINE (APRESOLINE) 50 MG tablet Take 50 mg by mouth 2 (two) times daily.  [name] labetalol (NORMODYNE) 300 MG tablet Take 2 tablets (600 mg total) by mouth 2 (two) times daily.  . Multiple Vitamins-Minerals (MULTIVITAMIN WITH MINERALS) tablet Take 1 tablet by mouth daily.  . [DISCONTINUED] dibucaine (NUPERCAINAL) 1 % ointment Apply topically 3 (three) times daily as needed for  pain. (Patient not taking: Reported on 10/23/2015)  . [DISCONTINUED] hydrocortisone (ANUSOL-HC) 25 MG suppository Place 1 suppository (25 mg total) rectally 2 (two) times daily as needed for hemorrhoids. (Patient not taking: Reported on 10/23/2015)  . [DISCONTINUED] pantoprazole (PROTONIX) 20 MG tablet Take 1 tablet (20 mg total) by mouth daily.   No facility-administered encounter  medications on file as of 10/23/2015.    Allergies  Allergen Reactions  . Imdur [Isosorbide Nitrate] Other (See Comments)    headache  . Tramadol Other (See Comments)    "headaches" per pt   Patient Active Problem List   Diagnosis Date Noted  . HLD (hyperlipidemia) 08/28/2015  . Thrombosed hemorrhoids 06/14/2015  . Rectal bleeding 06/14/2015  . Rectal pain 06/14/2015  . 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[place]) 06/07/2015  . Cardiomyopathy ([place]) 03/01/2015  . Acid reflux 03/01/2015  . BP (high blood pressure) 03/01/2015  . Cardiac chest pain 12/10/2014  . Abdominal pain 10/12/2014  . Injury of kidney 10/03/2014  . History of aortic arch replacement 10/03/2014  . H/O aortic valve replacement 10/03/2014  . Pre-op testing 06/15/2014  . CKD (chronic kidney disease) 05/15/2014  . Hypoxia   . AKI (acute kidney injury) ([place])   . Aortic dissection, thoracoabdominal ([place])   . 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 Aortic dissection, thoracic ([place]) 04/24/2014  . Aortic dissection ([place]) 04/23/2014  . Aneurysm of ascending aorta (HCC) 03/28/2014  . Dyspnea 03/27/2014  . Prolonged Q-T interval on ECG 03/27/2014  . ETOH abuse 09/20/2013  . Hypertensive crisis 08/08/2013  . Sinusitis, bacterial 08/08/2013  . Malignant hypertension 02/20/2013  . Hypertensive urgency 01/27/2012  . Cardiomyopathy, hypertensive ([place]) 01/26/2012  . AI (aortic insufficiency) 01/26/2012  . MR (mitral regurgitation) 01/26/2012  . Acute renal failure superimposed on stage 3 chronic kidney disease ([place]) 01/26/2012  . Systolic CHF, chronic ([place]) 06/10/2011  . Chronic bronchitis 05/23/2011  . Hypertensive emergency 05/22/2011  . Cardiomegaly - hypertensive 05/22/2011  . Tobacco abuse 05/22/2011  . Marijuana use 05/22/2011  . Abdominal pain 05/22/2011  . Hypertension, malignant 05/22/2011  . Anxiety and depression 05/22/2011  . Hyperlipidemia 08/26/2009  . Tobacco use  08/26/2009  . Headache(784.0) 08/26/2009  . Aortic valve disorder 03/11/2009  . INGUINAL HERNIA 02/18/2009  . Uncontrolled hypertension 01/16/2009  . MURMUR 01/16/2009   Social History   Social History  . Marital status: Married    Spouse name: N/A  . Number of children: 5  . Years of education: N/A   Occupational History  . Disabled, part-time Programmer, systems     Disability   Social History Main Topics  . Smoking status: Current Every Day Smoker    Packs/day: 0.25    Years: 23.00    Types: Cigarettes  . Smokeless tobacco: Former Systems developer    Types: Chew     Comment: as of 05-22-14 down to 3-4 cigs daily- 06/14/15- 4 cigs  . Alcohol use No     Comment: + previous use  . Drug use:     Types: Marijuana     Comment: 08/08/2013 "twice a month"  last time 06/13/15  . Sexual activity: Yes   Other Topics Concern  . Not on file   Social History Narrative  . No narrative on file    [name] family history includes Colon cancer in his paternal uncle; Diabetes in his maternal aunt; Heart attack in his brother; Stroke in his maternal aunt.      Objective:  Vitals:   10/23/15 1503  BP: (!) 198/102  Pulse: 80    Physical Exam  well-developed thin African-American male in no acute distress, blood pressure 198/102, pulse 80. HEENT; nontraumatic normocephalic EOMI PERRLA sclera anicteric, Cardiovascular; regular rate and rhythm with S1-S2 he has significant systolic murmur, Pulmonary; clear bilaterally, Abdomen;  radiated murmur audible in the abdomen he also has a bruit audible in the left mid abdomen and upper abdominal incisional scars, there is no palpable mass or hepatosplenomegaly is currently nontender to exam costal margin tenderness on Rectal ;exam not done, Ext;no clubbing cyanosis or edema skin warm and dry  Neuropsych ;mood and affect appropriate       Assessment & Plan:   #1 45 yo African-American male with multiple serious medical problems who comes in with mild  left-sided abdominal discomfort which had been present off and on over the past month and has resolved over the past few days. I don't think he has any underlying GI issue, pain may have been musculoskeletal. CT angio of the chest abdomen and pelvis done about 9 days ago showed his aortic stent graft to be intact and no other non vascular concerning findings #2 severe hypertension chronic #3 history of a ascending thoracic aortic dissection extensive status post repair August 2016 #4 mitral regurgitation and aortic insufficiency #5 cardiomyopathy #6 chronic kidney disease #7 history of sessile serrated adenomatous polyps and hyperplastic polyps-will need follow-up colonoscopy 2020  Plan; no changes from GI perspective today he can use Tylenol as needed for discomfort . He needs cardiology follow-up and was asked to make an appointment with Dr. [name] He has follow-up with his vascular surgeon at [facility] within the month.   Amy S Esterwood PA-C 10/23/2015   Cc: [name], MD

## 2015-10-28 NOTE — Progress Notes (Signed)
Reviewed and agree with management plan.  Jaamal Farooqui T. Glorian Mcdonell, MD FACG 

## 2015-10-29 ENCOUNTER — Encounter (HOSPITAL_COMMUNITY): Payer: Self-pay

## 2015-10-29 ENCOUNTER — Emergency Department (HOSPITAL_COMMUNITY)
Admission: EM | Admit: 2015-10-29 | Discharge: 2015-10-29 | Disposition: A | Discharge status: Home / Self Care | Payer: Medicare Other | Attending: Emergency Medicine | Admitting: Emergency Medicine

## 2015-10-29 DIAGNOSIS — Z7982 Long term (current) use of aspirin: Secondary | ICD-10-CM | POA: Insufficient documentation

## 2015-10-29 DIAGNOSIS — R1012 Left upper quadrant pain: Secondary | ICD-10-CM | POA: Insufficient documentation

## 2015-10-29 DIAGNOSIS — Z79899 Other long term (current) drug therapy: Secondary | ICD-10-CM | POA: Diagnosis not present

## 2015-10-29 DIAGNOSIS — Z720 Tobacco use: Secondary | ICD-10-CM | POA: Diagnosis not present

## 2015-10-29 DIAGNOSIS — Z9889 Other specified postprocedural states: Secondary | ICD-10-CM | POA: Diagnosis not present

## 2015-10-29 DIAGNOSIS — G8929 Other chronic pain: Secondary | ICD-10-CM

## 2015-10-29 DIAGNOSIS — D649 Anemia, unspecified: Secondary | ICD-10-CM | POA: Diagnosis not present

## 2015-10-29 DIAGNOSIS — I130 Hypertensive heart and chronic kidney disease with heart failure and stage 1 through stage 4 chronic kidney disease, or unspecified chronic kidney disease: Secondary | ICD-10-CM | POA: Diagnosis not present

## 2015-10-29 DIAGNOSIS — I10 Essential (primary) hypertension: Secondary | ICD-10-CM | POA: Diagnosis not present

## 2015-10-29 DIAGNOSIS — F17210 Nicotine dependence, cigarettes, uncomplicated: Secondary | ICD-10-CM | POA: Diagnosis not present

## 2015-10-29 DIAGNOSIS — N189 Chronic kidney disease, unspecified: Secondary | ICD-10-CM | POA: Diagnosis not present

## 2015-10-29 DIAGNOSIS — I2510 Atherosclerotic heart disease of native coronary artery without angina pectoris: Secondary | ICD-10-CM | POA: Diagnosis not present

## 2015-10-29 DIAGNOSIS — Z8679 Personal history of other diseases of the circulatory system: Secondary | ICD-10-CM | POA: Diagnosis not present

## 2015-10-29 DIAGNOSIS — I5042 Chronic combined systolic (congestive) and diastolic (congestive) heart failure: Secondary | ICD-10-CM | POA: Diagnosis not present

## 2015-10-29 DIAGNOSIS — N280 Ischemia and infarction of kidney: Secondary | ICD-10-CM | POA: Diagnosis not present

## 2015-10-29 DIAGNOSIS — I7100 Dissection of unspecified site of aorta: Secondary | ICD-10-CM | POA: Diagnosis not present

## 2015-10-29 DIAGNOSIS — N183 Chronic kidney disease, stage 3 (moderate): Secondary | ICD-10-CM | POA: Diagnosis not present

## 2015-10-29 LAB — CBC WITH DIFFERENTIAL/PLATELET
BASOS ABS: 0 10*3/uL (ref 0.0–0.1)
Basophils Relative: 1 %
EOS ABS: 0.1 10*3/uL (ref 0.0–0.7)
EOS PCT: 2 %
HCT: 37.8 % — ABNORMAL LOW (ref 39.0–52.0)
HEMOGLOBIN: 12.3 g/dL — AB (ref 13.0–17.0)
LYMPHS ABS: 1.4 10*3/uL (ref 0.7–4.0)
LYMPHS PCT: 22 %
MCH: 30.4 pg (ref 26.0–34.0)
MCHC: 32.5 g/dL (ref 30.0–36.0)
MCV: 93.3 fL (ref 78.0–100.0)
MONOS PCT: 12 %
Monocytes Absolute: 0.8 10*3/uL (ref 0.1–1.0)
NEUTROS PCT: 63 %
Neutro Abs: 4.2 10*3/uL (ref 1.7–7.7)
PLATELETS: 180 10*3/uL (ref 150–400)
RBC: 4.05 MIL/uL — AB (ref 4.22–5.81)
RDW: 15 % (ref 11.5–15.5)
WBC: 6.6 10*3/uL (ref 4.0–10.5)

## 2015-10-29 LAB — URINALYSIS, ROUTINE W REFLEX MICROSCOPIC
Glucose, UA: NEGATIVE mg/dL
Hgb urine dipstick: NEGATIVE
KETONES UR: 15 mg/dL — AB
LEUKOCYTES UA: NEGATIVE
NITRITE: NEGATIVE
PROTEIN: 100 mg/dL — AB
Specific Gravity, Urine: 1.024 (ref 1.005–1.030)
pH: 5.5 (ref 5.0–8.0)

## 2015-10-29 LAB — URINE MICROSCOPIC-ADD ON

## 2015-10-29 LAB — BASIC METABOLIC PANEL
ANION GAP: 6 (ref 5–15)
BUN: 12 mg/dL (ref 6–20)
CHLORIDE: 108 mmol/L (ref 101–111)
CO2: 23 mmol/L (ref 22–32)
Calcium: 9.2 mg/dL (ref 8.9–10.3)
Creatinine, Ser: 1.45 mg/dL — ABNORMAL HIGH (ref 0.61–1.24)
GFR calc Af Amer: >60 mL/min (ref >60)
GFR, EST NON AFRICAN AMERICAN: 58 mL/min — AB (ref >60)
Glucose, Bld: 70 mg/dL (ref 65–99)
POTASSIUM: 3.8 mmol/L (ref 3.5–5.1)
SODIUM: 137 mmol/L (ref 135–145)

## 2015-10-29 LAB — POC OCCULT BLOOD, ED: FECAL OCCULT BLD: NEGATIVE

## 2015-10-29 LAB — LIPASE, BLOOD: LIPASE: 15 U/L (ref 11–51)

## 2015-10-29 LAB — I-STAT CG4 LACTIC ACID, ED: LACTIC ACID, VENOUS: 0.61 mmol/L (ref 0.5–1.9)

## 2015-10-29 MED ORDER — OMEPRAZOLE 20 MG PO CPDR: 20.0000 mg | DELAYED_RELEASE_CAPSULE | Freq: Every day | ORAL | 0 refills | Status: DC

## 2015-10-29 MED ORDER — HYDROMORPHONE HCL 1 MG/ML IJ SOLN
1.0000 mg | Freq: Once | INTRAMUSCULAR | Status: AC
  Administered 2015-10-29: 1 mg via INTRAVENOUS
  Filled 2015-10-29: qty 1

## 2015-10-29 MED ORDER — FENTANYL CITRATE (PF) 100 MCG/2ML IJ SOLN: 50.0000 ug | INTRAMUSCULAR | Status: DC | PRN

## 2015-10-29 NOTE — Discharge Instructions (Signed)
Follow-up closely with your specialists from your surgery and gastroenterology. If he developed active blood in your stools, worsening symptoms, you pass out, chest pain or new concerns return immediately to the emergency department.  If you were given medicines take as directed.  If you are on coumadin or contraceptives realize their levels and effectiveness is altered by many different medicines.  If you have any reaction (rash, tongues swelling, other) to the medicines stop taking and see a physician.    If your blood pressure was elevated in the ER make sure you follow up for management with a primary doctor or return for chest pain, shortness of breath or stroke symptoms.  Please follow up as directed and return to the ER or see a physician for new or worsening symptoms.  Thank you. Vitals:   10/29/15 1030 10/29/15 1045 10/29/15 1100 10/29/15 1115  BP: 122/76 137/77 129/80 124/74  Pulse: 62 62 61 63  Resp: 17 20 16 16   Temp:      TempSrc:      SpO2: 99% 99% 99% 99%  Weight:      Height:

## 2015-10-29 NOTE — ED Provider Notes (Signed)
[facility] DEPT Provider Note   CSN: [id number] Arrival date & time: 10/29/15  [id number]     History   Chief Complaint Chief Complaint  Patient presents with  . Abdominal Pain    HPI [name] is a 45 y.o. male.  45 year old male with history of aortic dissection, aortic valve repair, reflux, alcohol abuse, anxiety, colon polyps presents with left upper quadrant discomfort for the past 2 months. This is been since he had a colonoscopy which was unremarkable. No fevers chills or vomiting. Pain worse with movement.  Pain is fairly constant. Patient has follow-up in the near future with his specialist. Patient at times is darker stools but no gross bleeding. This pain is different from his dissection pain.   The history is provided by the patient.  Abdominal Pain   Pertinent negatives include fever, vomiting, dysuria, hematuria and arthralgias.    Past Medical History:  Diagnosis Date  . Anxiety   . Aortic disease ([place])   . Aortic dissection (HCC)    a. admx 04/2014 >> L renal infarct; a/c renal failure >> b.  s/p Bioprosthetic [place] and total arch replacement and staged endovascular repair of descending aortic aneurysm ([facility] - Dr. [name])  . CAD (coronary artery disease)    a. LHC 4/16:  oD1 60%  . Cardiomyopathy ([place])    a. non-ischemic - probably related to untreated HTN and ETOH abuse - Echo 3/13 with EF 35-40% >> b. Echo 4/16: Severe LVH, EF 55-60%, moderate AI, moderate MR, mild LAE, trivial effusion, known type B dissection with communication between true and false lumens with suprasternal images suggesting dissection plane may propagate to at least left subclavian takeoff, root above aortic valve okay    . Chronic abdominal pain   . Chronic combined systolic and diastolic congestive heart failure ([place])    a. 05/2011: Adm with pulm edema/HTN urgency, EF 35-40% with diffuse hypokinesis and moderate to severe mitral regurgitation. Cardiomyopathy likely due to uncontrolled  HTN and ETOH abuse - cath deferred due to renal insufficiency (felt due to uncontrolled HTN). bJodie Echevaria MV 06/2011: EF 37% and no ischemia or infarction. c. EF 45-50% by echo 01/2012.  [name] Chronic sinusitis   . CKD (chronic kidney disease)    a. 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  peak creatinine 3.46 during admx for aortic dissection 2/16.  sees Dr [name]  . Colon polyps 09/11/2015   Descending, sigmoid polyps  . DDD (degenerative disc disease), lumbar   . Descending thoracic aortic aneurysm ([place])   . Dissecting aneurysm of thoracic aorta ([place])   . ETOH abuse    a. Reported to have quit 05/2011.  [name] GERD (gastroesophageal reflux disease)   . Headache(784.0)    "q other day" (08/08/2013)  . Heart murmur   . Hemorrhoid thrombosis   . History of echocardiogram    Echo 1/17:  Severe LVH, EF 55-60%, no RWMA, Gr 2 DD, AVR ok, mild to mod MR, mild LAE, mild reduced RVSF, mod RAE  . History of medication noncompliance   . HYPERLIPIDEMIA   . Hypertension    a. Hx of HTN urgency secondary to noncompliance. b. urinary metanephrine and catecholeamine levels normal 2013.  c. Renal art [name] 1/16:  No evidence of renal artery stenosis noted bilaterally.  . INGUINAL HERNIA   . Pneumonia ~ 2013  . Renal insufficiency   . Tobacco abuse   . Valvular heart disease    a. Echo 05/2011: moderate to severe eccentric MR and  mild to moderate AI with prolapsing left coronary cusp. b. Echo 01/2012: mild-mod AI, mild dilitation of aortic root, mild MR.;  c. Echo 1/16: Severe LVH consistent with hypertrophic cardio myopathy, EF 50%, no RWMA, mod AI, mild MR, mild RAE, dilated Ao root (40 mm);     Patient Active Problem List   Diagnosis Date Noted  . HLD (hyperlipidemia) 08/28/2015  . Thrombosed hemorrhoids 06/14/2015  . Rectal bleeding 06/14/2015  . Rectal pain 06/14/2015  . 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[place]) 06/07/2015  . Cardiomyopathy ([place]) 03/01/2015  . Acid reflux 03/01/2015  . BP (high blood pressure)  03/01/2015  . Cardiac chest pain 12/10/2014  . Abdominal pain 10/12/2014  . Injury of kidney 10/03/2014  . History of aortic arch replacement 10/03/2014  . H/O aortic valve replacement 10/03/2014  . Pre-op testing 06/15/2014  . CKD (chronic kidney disease) 05/15/2014  . Hypoxia   . AKI (acute kidney injury) ([place])   . Aortic dissection, thoracoabdominal ([place])   . 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 Aortic dissection, thoracic ([place]) 04/24/2014  . Aortic dissection ([place]) 04/23/2014  . Aneurysm of ascending aorta (HCC) 03/28/2014  . Dyspnea 03/27/2014  . Prolonged Q-T interval on ECG 03/27/2014  . ETOH abuse 09/20/2013  . Hypertensive crisis 08/08/2013  . Sinusitis, bacterial 08/08/2013  . Malignant hypertension 02/20/2013  . Hypertensive urgency 01/27/2012  . Cardiomyopathy, hypertensive ([place]) 01/26/2012  . AI (aortic insufficiency) 01/26/2012  . MR (mitral regurgitation) 01/26/2012  . Acute renal failure superimposed on stage 3 chronic kidney disease ([place]) 01/26/2012  . Systolic CHF, chronic ([place]) 06/10/2011  . Chronic bronchitis 05/23/2011  . Hypertensive emergency 05/22/2011  . Cardiomegaly - hypertensive 05/22/2011  . Tobacco abuse 05/22/2011  . Marijuana use 05/22/2011  . Abdominal pain 05/22/2011  . Hypertension, malignant 05/22/2011  . Anxiety and depression 05/22/2011  . Hyperlipidemia 08/26/2009  . Tobacco use 08/26/2009  . Headache(784.0) 08/26/2009  . Aortic valve disorder 03/11/2009  . INGUINAL HERNIA 02/18/2009  . Uncontrolled hypertension 01/16/2009  . MURMUR 01/16/2009    Past Surgical History:  Procedure Laterality Date  . ANKLE SURGERY Bilateral    Fractures bilaterally  . AORTIC VALVE SURGERY  09/2014  . FOOT FRACTURE SURGERY Bilateral 2004-2010   "got pins in both of them"  . HEMORRHOID SURGERY N/A 06/15/2015   Procedure: HEMORRHOIDECTOMY;  Surgeon: Stark Klein, MD;  Location: [facility];  Service: General;   Laterality: N/A;  . INGUINAL HERNIA REPAIR Right ~ 1996  . LEFT HEART CATHETERIZATION WITH CORONARY ANGIOGRAM N/A 06/21/2014   Procedure: LEFT HEART CATHETERIZATION WITH CORONARY ANGIOGRAM;  Surgeon: Larey Dresser, MD;  Location: [facility] CATH LAB;  Service: Cardiovascular;  Laterality: N/A;       Home Medications    Prior to Admission medications   Medication Sig Start Date End Date Taking? Authorizing Provider  acetaminophen (TYLENOL) 500 MG tablet Take 1,000 mg by mouth 3 (three) times daily.   Yes Historical Provider, MD  amLODipine (NORVASC) 10 MG tablet Take 10 mg by mouth daily.   Yes Historical Provider, MD  aspirin 81 MG tablet Take 81 mg by mouth daily.   Yes Historical Provider, MD  atorvastatin (LIPITOR) 20 MG tablet Take 20 mg by mouth daily. 02/07/15  Yes Historical Provider, MD  cloNIDine (CATAPRES) 0.2 MG tablet Take 1 tablet (0.2 mg total) by mouth 3 (three) times daily. 09/09/15  Yes Marijean Heath, NP  hydrALAZINE (APRESOLINE) 50 MG tablet Take 50 mg by  mouth 2 (two) times daily. 12/04/14  Yes Historical Provider, MD  labetalol (NORMODYNE) 300 MG tablet Take 2 tablets (600 mg total) by mouth 2 (two) times daily. 12/14/14  Yes Liliane Shi, PA-C  Multiple Vitamins-Minerals (MULTIVITAMIN WITH MINERALS) tablet Take 1 tablet by mouth daily.   Yes Historical Provider, MD  acetaminophen-codeine (TYLENOL #3) 300-30 MG tablet Take 1 tablet by mouth every 8 (eight) hours as needed for moderate pain. Patient not taking: Reported on 10/29/2015 06/13/15   Ladene Artist, MD    Family History Family History  Problem Relation Age of Onset  . Hypertension    . Colon cancer Paternal Uncle   . Stroke Maternal Aunt   . Heart attack Brother   . Diabetes Maternal Aunt   . Throat cancer Neg Hx   . Pancreatic cancer Neg Hx   . Esophageal cancer Neg Hx   . Kidney disease Neg Hx   . Liver disease Neg Hx     Social History Social History  Substance Use Topics  . Smoking status:  Current Some Day Smoker    Packs/day: 0.25    Years: 23.00    Types: Cigarettes  . Smokeless tobacco: Former Systems developer    Types: Chew     Comment: as of 05-22-14 down to 3-4 cigs daily- 06/14/15- 4 cigs  . Alcohol use No     Comment: + previous use     Allergies   Imdur [isosorbide nitrate] and Tramadol   Review of Systems Review of Systems  Constitutional: Negative for chills and fever.  HENT: Negative for ear pain and sore throat.   Eyes: Negative for pain and visual disturbance.  Respiratory: Negative for cough and shortness of breath.   Cardiovascular: Negative for chest pain and palpitations.  Gastrointestinal: Positive for abdominal pain. Negative for vomiting.  Genitourinary: Negative for dysuria and hematuria.  Musculoskeletal: Positive for back pain. Negative for arthralgias.  Skin: Negative for color change and rash.  Neurological: Negative for seizures and syncope.  All other systems reviewed and are negative.    Physical Exam Updated Vital Signs BP 124/74   Pulse 63   Temp 97.6 F (36.4 C) (Oral)   Resp 16   Ht 5\' 11"  (1.803 m)   Wt 156 lb (70.8 kg)   SpO2 99%   BMI 21.76 kg/m   Physical Exam  Constitutional: He is oriented to person, place, and time. He appears well-developed and well-nourished.  HENT:  Head: Normocephalic and atraumatic.  Eyes: Conjunctivae are normal. Right eye exhibits no discharge. Left eye exhibits no discharge.  Neck: Normal range of motion. Neck supple. No tracheal deviation present.  Cardiovascular: Normal rate, regular rhythm and intact distal pulses.   Pulmonary/Chest: Effort normal and breath sounds normal.  Abdominal: Soft. He exhibits no distension. There is tenderness (left upper quadrant mild tenderness to anterior lower ribs on the left). There is no guarding.  Musculoskeletal: He exhibits no edema.  Neurological: He is alert and oriented to person, place, and time.  Skin: Skin is warm. No rash noted.  Psychiatric: He has  a normal mood and affect.  Nursing note and vitals reviewed.    ED Treatments / Results  Labs (all labs ordered are listed, but only abnormal results are displayed) Labs Reviewed  CBC WITH DIFFERENTIAL/PLATELET - Abnormal; Notable for the following:       Result Value   RBC 4.05 (*)    Hemoglobin 12.3 (*)    HCT 37.8 (*)  All other components within normal limits  BASIC METABOLIC PANEL - Abnormal; Notable for the following:    Creatinine, Ser 1.45 (*)    GFR calc non Af Amer 58 (*)    All other components within normal limits  URINALYSIS, ROUTINE W REFLEX MICROSCOPIC (NOT AT [facility]) - Abnormal; Notable for the following:    Bilirubin Urine SMALL (*)    Ketones, ur 15 (*)    Protein, ur 100 (*)    All other components within normal limits  URINE MICROSCOPIC-ADD ON - Abnormal; Notable for the following:    Squamous Epithelial / LPF 0-5 (*)    Bacteria, UA RARE (*)    Casts HYALINE CASTS (*)    All other components within normal limits  LIPASE, BLOOD  I-STAT CG4 LACTIC ACID, ED  POC OCCULT BLOOD, ED    EKG  EKG Interpretation None       Radiology No results found.  Procedures Procedures (including critical care time)  Medications Ordered in ED Medications  HYDROmorphone (DILAUDID) injection 1 mg (1 mg Intravenous Given 10/29/15 0956)     Initial Impression / Assessment and Plan / ED Course  I have reviewed the triage vital signs and the nursing notes.  Pertinent labs & imaging results that were available during my care of the patient were reviewed by me and considered in my medical decision making (see chart for details).  Clinical Course   Patient presents with recurrent left flank pain. Patient's had this pain for 2 months and had a CT angiography performed 2 weeks prior results reviewed and stable. Patient has equal pulses in the ER, minimal pain on exam. Discussed recent benefits of repeat CT scan as patient is had multiple the past year. Patient  understands and agrees with holding his pain is identical to 2 weeks prior. Patient has follow-up with a specialist. Blood work overall unremarkable.  Patient improved in the ER. Plan for close outpatient follow-up. Blood work overall stable mild drop in hemoglobin, plan for Hemoccult. Neg.  Fup with GI in clinic.   Results and differential diagnosis were discussed with the patient/parent/guardian. Xrays were independently reviewed by myself.  Close follow up outpatient was discussed, comfortable with the plan.   Medications  HYDROmorphone (DILAUDID) injection 1 mg (1 mg Intravenous Given 10/29/15 0956)    Vitals:   10/29/15 1030 10/29/15 1045 10/29/15 1100 10/29/15 1115  BP: 122/76 137/77 129/80 124/74  Pulse: 62 62 61 63  Resp: 17 20 16 16   Temp:      TempSrc:      SpO2: 99% 99% 99% 99%  Weight:      Height:        Final diagnoses:  Abdominal pain, chronic, left upper quadrant  Anemia, unspecified    Final Clinical Impressions(s) / ED Diagnoses   Final diagnoses:  Abdominal pain, chronic, left upper quadrant  Anemia, unspecified    New Prescriptions New Prescriptions   No medications on file     Elnora Morrison, MD 10/29/15 1136

## 2015-10-29 NOTE — ED Notes (Signed)
Pt placed on cardiac monitor 

## 2015-10-29 NOTE — ED Triage Notes (Signed)
Per pt: Pt states that he is having left sided upper abdominal pain and left sided back pain. Pt states that he has been having this pain since he had his colonoscopy two months. He states that "it's been the exact same since then, I went to my doctor last week about it and they didn't do anything about it". Pt states that he has been taking tylenol 3 times a day, 6 pills a day, for the last two months. Pt states that they tylenol isn't helping the pain. Pt denies vomiting or diarrhea.

## 2015-10-29 NOTE — ED Notes (Signed)
Provider at bedside

## 2015-10-30 ENCOUNTER — Telehealth: Payer: Self-pay | Admitting: Gastroenterology

## 2015-10-30 NOTE — Telephone Encounter (Signed)
ER records and [facility] notes reviewed with Dr. Fuller Plan.  Per Dr. Fuller Plan patient to return to [facility].  Can't find any GI cause for the pain. Patient notified.  He will try and move up his appt with [facility].  He is reminded to also get an appt back with Cardiology that Nicoletta Ba PA recommended in the last office note from 10/23/15

## 2015-11-10 ENCOUNTER — Encounter (HOSPITAL_COMMUNITY): Payer: Self-pay | Admitting: Emergency Medicine

## 2015-11-10 ENCOUNTER — Emergency Department (HOSPITAL_COMMUNITY): Payer: Medicare Other

## 2015-11-10 ENCOUNTER — Emergency Department (HOSPITAL_COMMUNITY)
Admission: EM | Admit: 2015-11-10 | Discharge: 2015-11-10 | Disposition: A | Discharge status: Home / Self Care | Payer: Medicare Other | Attending: Emergency Medicine | Admitting: Emergency Medicine

## 2015-11-10 DIAGNOSIS — I5042 Chronic combined systolic (congestive) and diastolic (congestive) heart failure: Secondary | ICD-10-CM | POA: Insufficient documentation

## 2015-11-10 DIAGNOSIS — R0789 Other chest pain: Secondary | ICD-10-CM | POA: Diagnosis not present

## 2015-11-10 DIAGNOSIS — N183 Chronic kidney disease, stage 3 (moderate): Secondary | ICD-10-CM | POA: Insufficient documentation

## 2015-11-10 DIAGNOSIS — I130 Hypertensive heart and chronic kidney disease with heart failure and stage 1 through stage 4 chronic kidney disease, or unspecified chronic kidney disease: Secondary | ICD-10-CM | POA: Insufficient documentation

## 2015-11-10 DIAGNOSIS — R071 Chest pain on breathing: Secondary | ICD-10-CM | POA: Diagnosis not present

## 2015-11-10 DIAGNOSIS — R079 Chest pain, unspecified: Secondary | ICD-10-CM

## 2015-11-10 DIAGNOSIS — I2510 Atherosclerotic heart disease of native coronary artery without angina pectoris: Secondary | ICD-10-CM | POA: Diagnosis not present

## 2015-11-10 DIAGNOSIS — F17210 Nicotine dependence, cigarettes, uncomplicated: Secondary | ICD-10-CM | POA: Diagnosis not present

## 2015-11-10 DIAGNOSIS — Z7982 Long term (current) use of aspirin: Secondary | ICD-10-CM | POA: Insufficient documentation

## 2015-11-10 LAB — CBC
HCT: 37.2 % — ABNORMAL LOW (ref 39.0–52.0)
HEMOGLOBIN: 12.3 g/dL — AB (ref 13.0–17.0)
MCH: 30.5 pg (ref 26.0–34.0)
MCHC: 33.1 g/dL (ref 30.0–36.0)
MCV: 92.3 fL (ref 78.0–100.0)
PLATELETS: 172 10*3/uL (ref 150–400)
RBC: 4.03 MIL/uL — AB (ref 4.22–5.81)
RDW: 14.6 % (ref 11.5–15.5)
WBC: 6.1 10*3/uL (ref 4.0–10.5)

## 2015-11-10 LAB — BASIC METABOLIC PANEL
ANION GAP: 5 (ref 5–15)
BUN: 8 mg/dL (ref 6–20)
CALCIUM: 8.8 mg/dL — AB (ref 8.9–10.3)
CO2: 22 mmol/L (ref 22–32)
CREATININE: 1.4 mg/dL — AB (ref 0.61–1.24)
Chloride: 110 mmol/L (ref 101–111)
GLUCOSE: 114 mg/dL — AB (ref 65–99)
Potassium: 3.6 mmol/L (ref 3.5–5.1)
Sodium: 137 mmol/L (ref 135–145)

## 2015-11-10 LAB — I-STAT TROPONIN, ED
TROPONIN I, POC: 0.02 ng/mL (ref 0.00–0.08)
Troponin i, poc: 0.02 ng/mL (ref 0.00–0.08)

## 2015-11-10 MED ORDER — MORPHINE SULFATE (PF) 4 MG/ML IV SOLN
4.0000 mg | Freq: Once | INTRAVENOUS | Status: AC
  Administered 2015-11-10: 4 mg via INTRAVENOUS
  Filled 2015-11-10: qty 1

## 2015-11-10 MED ORDER — IOPAMIDOL (ISOVUE-370) INJECTION 76%
INTRAVENOUS | Status: AC
  Administered 2015-11-10: 100 mL
  Filled 2015-11-10: qty 100

## 2015-11-10 NOTE — ED Provider Notes (Signed)
[facility] DEPT Provider Note   CSN: [id number] Arrival date & time: 11/10/15  0803     History   Chief Complaint Chief Complaint  Patient presents with  . Chest Pain  . Back Pain    HPI [name] is a 45 y.o. male.  The history is provided by the patient and medical records.     45 year old male with history of aortic dissection status post repair at [facility] by Dr. [name], coronary artery disease, chronic kidney disease, alcohol abuse, GERD, hyperlipidemia, presenting to the ED for chest pain. Patient reports this is been occurring on and off since his initial dissection in February 2016. He states usually pain is mild, however over the past week his pain is worsened. He states he has sharp, stabbing chest pain in the center of his chest which radiates directly through to his back. He reports some pain extending into the right arm last night, this seems to have resolved at this time. He denies any numbness or weakness of his extremities. States he has had some mild epigastric pain as well however this is not necessarily uncommon for him. He does not have any history of MI. He is a smoker but states he has cut back severely, now only smoking about 4 cigarettes a day. He occasionally smokes marijuana, no cocaine use. Denies any recent alcohol abuse.  Past Medical History:  Diagnosis Date  . Anxiety   . Aortic disease ([place])   . Aortic dissection (HCC)    a. admx 04/2014 >> L renal infarct; a/c renal failure >> b.  s/p Bioprosthetic [place] and total arch replacement and staged endovascular repair of descending aortic aneurysm ([facility] - Dr. [name])  . CAD (coronary artery disease)    a. LHC 4/16:  oD1 60%  . Cardiomyopathy ([place])    a. non-ischemic - probably related to untreated HTN and ETOH abuse - Echo 3/13 with EF 35-40% >> b. Echo 4/16: Severe LVH, EF 55-60%, moderate AI, moderate MR, mild LAE, trivial effusion, known type B dissection with communication between true and false  lumens with suprasternal images suggesting dissection plane may propagate to at least left subclavian takeoff, root above aortic valve okay    . Chronic abdominal pain   . Chronic combined systolic and diastolic congestive heart failure ([place])    a. 05/2011: Adm with pulm edema/HTN urgency, EF 35-40% with diffuse hypokinesis and moderate to severe mitral regurgitation. Cardiomyopathy likely due to uncontrolled HTN and ETOH abuse - cath deferred due to renal insufficiency (felt due to uncontrolled HTN). bJodie Echevaria MV 06/2011: EF 37% and no ischemia or infarction. c. EF 45-50% by echo 01/2012.  [name] Chronic sinusitis   . CKD (chronic kidney disease)    a. 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  peak creatinine 3.46 during admx for aortic dissection 2/16.  sees Dr [name]  . Colon polyps 09/11/2015   Descending, sigmoid polyps  . DDD (degenerative disc disease), lumbar   . Descending thoracic aortic aneurysm ([place])   . Dissecting aneurysm of thoracic aorta ([place])   . ETOH abuse    a. Reported to have quit 05/2011.  [name] GERD (gastroesophageal reflux disease)   . Headache(784.0)    "q other day" (08/08/2013)  . Heart murmur   . Hemorrhoid thrombosis   . History of echocardiogram    Echo 1/17:  Severe LVH, EF 55-60%, no RWMA, Gr 2 DD, AVR ok, mild to mod MR, mild LAE, mild reduced RVSF, mod RAE  . History  of medication noncompliance   . HYPERLIPIDEMIA   . Hypertension    a. Hx of HTN urgency secondary to noncompliance. b. urinary metanephrine and catecholeamine levels normal 2013.  c. Renal art [name] 1/16:  No evidence of renal artery stenosis noted bilaterally.  . INGUINAL HERNIA   . Pneumonia ~ 2013  . Renal insufficiency   . Tobacco abuse   . Valvular heart disease    a. Echo 05/2011: moderate to severe eccentric MR and mild to moderate AI with prolapsing left coronary cusp. b. Echo 01/2012: mild-mod AI, mild dilitation of aortic root, mild MR.;  c. Echo 1/16: Severe LVH consistent with hypertrophic cardio  myopathy, EF 50%, no RWMA, mod AI, mild MR, mild RAE, dilated Ao root (40 mm);     Patient Active Problem List   Diagnosis Date Noted  . HLD (hyperlipidemia) 08/28/2015  . Thrombosed hemorrhoids 06/14/2015  . Rectal bleeding 06/14/2015  . Rectal pain 06/14/2015  . 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[place]) 06/07/2015  . Cardiomyopathy ([place]) 03/01/2015  . Acid reflux 03/01/2015  . BP (high blood pressure) 03/01/2015  . Cardiac chest pain 12/10/2014  . Abdominal pain 10/12/2014  . Injury of kidney 10/03/2014  . History of aortic arch replacement 10/03/2014  . H/O aortic valve replacement 10/03/2014  . Pre-op testing 06/15/2014  . CKD (chronic kidney disease) 05/15/2014  . Hypoxia   . AKI (acute kidney injury) ([place])   . Aortic dissection, thoracoabdominal ([place])   . Dissecting aneurysm of thoracic aorta, Stanford type B ([place]) 04/24/2014  . Aortic dissection, thoracic ([place]) 04/24/2014  . Aortic dissection ([place]) 04/23/2014  . Aneurysm of ascending aorta (HCC) 03/28/2014  . Dyspnea 03/27/2014  . Prolonged Q-T interval on ECG 03/27/2014  . ETOH abuse 09/20/2013  . Hypertensive crisis 08/08/2013  . Sinusitis, bacterial 08/08/2013  . Malignant hypertension 02/20/2013  . Hypertensive urgency 01/27/2012  . Cardiomyopathy, hypertensive ([place]) 01/26/2012  . AI (aortic insufficiency) 01/26/2012  . MR (mitral regurgitation) 01/26/2012  . Acute renal failure superimposed on stage 3 chronic kidney disease ([place]) 01/26/2012  . Systolic CHF, chronic ([place]) 06/10/2011  . Chronic bronchitis 05/23/2011  . Hypertensive emergency 05/22/2011  . Cardiomegaly - hypertensive 05/22/2011  . Tobacco abuse 05/22/2011  . Marijuana use 05/22/2011  . Abdominal pain 05/22/2011  . Hypertension, malignant 05/22/2011  . Anxiety and depression 05/22/2011  . Hyperlipidemia 08/26/2009  . Tobacco use 08/26/2009  . Headache(784.0) 08/26/2009  . Aortic valve disorder 03/11/2009  . INGUINAL HERNIA 02/18/2009  .  Uncontrolled hypertension 01/16/2009  . MURMUR 01/16/2009    Past Surgical History:  Procedure Laterality Date  . ANKLE SURGERY Bilateral    Fractures bilaterally  . AORTIC VALVE SURGERY  09/2014  . FOOT FRACTURE SURGERY Bilateral 2004-2010   "got pins in both of them"  . HEMORRHOID SURGERY N/A 06/15/2015   Procedure: HEMORRHOIDECTOMY;  Surgeon: Stark Klein, MD;  Location: [facility];  Service: General;  Laterality: N/A;  . INGUINAL HERNIA REPAIR Right ~ 1996  . LEFT HEART CATHETERIZATION WITH CORONARY ANGIOGRAM N/A 06/21/2014   Procedure: LEFT HEART CATHETERIZATION WITH CORONARY ANGIOGRAM;  Surgeon: Larey Dresser, MD;  Location: [facility] CATH LAB;  Service: Cardiovascular;  Laterality: N/A;       Home Medications    Prior to Admission medications   Medication Sig Start Date End Date Taking? Authorizing Provider  acetaminophen (TYLENOL) 500 MG tablet Take 1,000 mg by mouth 3 (three) times daily.    Historical Provider, MD  acetaminophen-codeine (TYLENOL #3) 300-30 MG  tablet Take 1 tablet by mouth every 8 (eight) hours as needed for moderate pain. Patient not taking: Reported on 10/29/2015 06/13/15   Ladene Artist, MD  amLODipine (NORVASC) 10 MG tablet Take 10 mg by mouth daily.    Historical Provider, MD  aspirin 81 MG tablet Take 81 mg by mouth daily.    Historical Provider, MD  atorvastatin (LIPITOR) 20 MG tablet Take 20 mg by mouth daily. 02/07/15   Historical Provider, MD  cloNIDine (CATAPRES) 0.2 MG tablet Take 1 tablet (0.2 mg total) by mouth 3 (three) times daily. 09/09/15   Marijean Heath, NP  hydrALAZINE (APRESOLINE) 50 MG tablet Take 50 mg by mouth 2 (two) times daily. 12/04/14   Historical Provider, MD  labetalol (NORMODYNE) 300 MG tablet Take 2 tablets (600 mg total) by mouth 2 (two) times daily. 12/14/14   Liliane Shi, PA-C  Multiple Vitamins-Minerals (MULTIVITAMIN WITH MINERALS) tablet Take 1 tablet by mouth daily.    Historical Provider, MD  omeprazole (PRILOSEC) 20 MG  capsule Take 1 capsule (20 mg total) by mouth daily. 10/29/15   Elnora Morrison, MD    Family History Family History  Problem Relation Age of Onset  . Hypertension    . Colon cancer Paternal Uncle   . Stroke Maternal Aunt   . Heart attack Brother   . Diabetes Maternal Aunt   . Throat cancer Neg Hx   . Pancreatic cancer Neg Hx   . Esophageal cancer Neg Hx   . Kidney disease Neg Hx   . Liver disease Neg Hx     Social History Social History  Substance Use Topics  . Smoking status: Current Some Day Smoker    Packs/day: 0.25    Years: 23.00    Types: Cigarettes  . Smokeless tobacco: Former Systems developer    Types: Chew     Comment: as of 05-22-14 down to 3-4 cigs daily- 06/14/15- 4 cigs  . Alcohol use No     Comment: + previous use     Allergies   Imdur [isosorbide nitrate] and Tramadol   Review of Systems Review of Systems  Cardiovascular: Positive for chest pain.  Musculoskeletal: Positive for back pain.  All other systems reviewed and are negative.    Physical Exam Updated Vital Signs BP 142/79 (BP Location: Right Arm)   Pulse 80   Temp 98.1 F (36.7 C) (Oral)   Resp 17   Ht 5\' 11"  (1.803 m)   Wt 70.3 kg   SpO2 95%   BMI 21.62 kg/m   Physical Exam  Constitutional: He is oriented to person, place, and time. He appears well-developed and well-nourished. No distress.  Appears well, no distress, stable vital signs  HENT:  Head: Normocephalic and atraumatic.  Mouth/Throat: Oropharynx is clear and moist.  Eyes: Conjunctivae and EOM are normal. Pupils are equal, round, and reactive to light.  Neck: Normal range of motion.  Cardiovascular: Normal rate, regular rhythm and normal heart sounds.   Distal pulses intact 4  Pulmonary/Chest: Effort normal and breath sounds normal.  Midline sternotomy scar well-healed, no noted deformities of the chest or back, lungs clear bilaterally, no distress  Abdominal: Soft. Bowel sounds are normal.  Musculoskeletal: Normal range of  motion.  Neurological: He is alert and oriented to person, place, and time. No cranial nerve deficit or sensory deficit.  Skin: Skin is warm and dry.  Psychiatric: He has a normal mood and affect.  Nursing note and vitals reviewed.  ED Treatments / Results  Labs (all labs ordered are listed, but only abnormal results are displayed) Labs Reviewed  BASIC METABOLIC PANEL - Abnormal; Notable for the following:       Result Value   Glucose, Bld 114 (*)    Creatinine, Ser 1.40 (*)    Calcium 8.8 (*)    All other components within normal limits  CBC - Abnormal; Notable for the following:    RBC 4.03 (*)    Hemoglobin 12.3 (*)    HCT 37.2 (*)    All other components within normal limits  I-STAT TROPOININ, ED  I-STAT TROPOININ, ED    EKG  EKG Interpretation  Date/Time:  Sunday November 10 2015 08:05:35 EDT Ventricular Rate:  81 PR Interval:  146 QRS Duration: 94 QT Interval:  394 QTC Calculation: 457 R Axis:   58 Text Interpretation:  Normal sinus rhythm Biatrial enlargement Left ventricular hypertrophy with repolarization abnormality Abnormal ECG Confirmed by Winfred Leeds  MD, SAM ([id number]) on 11/10/2015 8:41:00 AM       Radiology Dg Chest 2 View  Result Date: 11/10/2015 CLINICAL DATA:  Mid chest pain for 1 week, dyspnea EXAM: CHEST  2 VIEW COMPARISON:  Radiograph 10/13/2015, CT 10/13/2015 FINDINGS: Aortic stent graft unchanged. Midline sternotomy. Prosthetic heart valves noted. Lungs are clear. No osseous abnormality. IMPRESSION: 1. No acute cardiopulmonary findings. 2. Aortic stent graft appears unchanged. Electronically Signed   By: Suzy Bouchard M.D.   On: 11/10/2015 08:44   Ct Angio Chest/abd/pel For Dissection W And/or Wo Contrast  Result Date: 11/10/2015 CLINICAL DATA:  Chest and back pain x1 week, progressive over the past day . History CHF. EXAM: CT ANGIOGRAPHY CHEST, ABDOMEN, PELVIS WITH CONTRAST TECHNIQUE: Multidetector CT imaging of the chest, abdomen, pelvis was  performed using the standard protocol during bolus administration of intravenous contrast. Multiplanar CT image reconstructions and MIPs were obtained to evaluate the vascular anatomy. CONTRAST:  100 mL Isovue 370 IV COMPARISON:  10/13/2015 and previous FINDINGS: CHEST Vascular: Right arm IV contrast administration. SVC patent. Right atrial enlargement. The right ventricle is nondilated. Satisfactory opacification of pulmonary arteries noted, and there is no evidence of pulmonary emboli. Patent bilateral pulmonary veins drain into the left atrium. Left atrial enlargement. Previous AVR. Proximal graft from the ascending aorta trifurcates to supply brachiocephalic vessels, with patent proximal and distal anastomoses. Stent graft from the mid ascending aorta through the arch and descending thoracic aorta. Probable retrograde filling of the false lumen of an aortic dissection in the distal descending segment. The ascending aorta is normal in caliber. Distal arch 4 cm diameter. Proximal descending returns to a normal diameter 3.3 cm. There is eccentric dilatation of the distal descending thoracic aorta related to the patent false lumen, measured up to 4.7 cm in the distal descending on the sagittal reconstructions image 105/11. The infrarenal aorta is mildly ectatic up to 2 cm diameter with some scattered calcified plaque. Mediastinum/Lymph Nodes: Multiple subcentimeter prevascular, AP window, and precarinal lymph nodes as before. No hilar adenopathy. No mediastinal hematoma. No pericardial effusion. Lungs/Pleura: Small scattered blebs in both upper lobes. Dependent atelectasis posteriorly in both lower lobes. No pleural effusion. No pneumothorax. Musculoskeletal: Previous median sternotomy.  Negative for fracture. ABDOMEN Arterial Aorta: The aortic dissection terminates below the level of renal arteries. There is saccular left lateral dilatation of the aorta, maximum transverse diameter 6.2 cm image 161/7, previously  5.7 cm by my measurement. There is mild compression of the true lumen bilobed false lumen above the  level of the SMA. Celiac axis:          Patent, supplied by false lumen. Superior mesenteric:  Patent, supply by true lumen Left renal: Patent stent from the true lumen into the proximal left renal artery, patent distally. Right renal:          Single, patent, supply by true lumen Inferior mesenteric:  Patent, supplied by the true lumen Left iliac: Common iliac ectatic up to 17 mm. Origin stenosis of the internal iliac artery. Scattered plaque in the external iliac, mildly tortuous. Right iliac: Scattered calcified plaque. No aneurysm, stenosis, or dissection. Venous Venous phase imaging not obtained. Review of the MIP images confirms the above findings. Nonvasular Hepatobiliary: Probable hepatic cyst in segment 6, stable. No new liver lesion evident on arterial phase imaging. Gallbladder is nondilated. Pancreas: No mass, inflammatory changes, or other significant abnormality. Spleen: Within normal limits in size and appearance. Adrenals/Urinary Tract: No masses identified. No evidence of hydronephrosis. Stomach/Bowel: Normal adrenals. Right kidney unremarkable. Focal parenchymal loss in the interpolar region left kidney. No mass or hydronephrosis. Lymphatic: No pathologically enlarged lymph nodes. Reproductive: Mild prostatic enlargement with central coarse calcification Other: No ascites.  No free air. Musculoskeletal: No suspicious bone lesions identified. IMPRESSION: 1. No acute PE. 2. Stable ascending aorta bypass graft to brachiocephalic vessels and thoracic aortic stent graft. 3. 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 Electronically Signed   By: Lucrezia Europe M.D.   On: 11/10/2015 13:16    Procedures Procedures (including critical care time)  Medications Ordered  in ED Medications  morphine 4 MG/ML injection 4 mg (4 mg Intravenous Given 11/10/15 0936)  iopamidol (ISOVUE-370) 76 % injection (100 mLs  Contrast Given 11/10/15 1101)  morphine 4 MG/ML injection 4 mg (4 mg Intravenous Given 11/10/15 1221)     Initial Impression / Assessment and Plan / ED Course  I have reviewed the triage vital signs and the nursing notes.  Pertinent labs & imaging results that were available during my care of the patient were reviewed by me and considered in my medical decision making (see chart for details).  Clinical Course   45 year old male here with chest pain radiates to the back. This seems to be an intermittent issue over the past several months, states worse over the past 2 days. States yesterday he had some right arm pain, this resolved. He is afebrile and nontoxic. Overall he appears well. Distal pulses are intact 4. His vitals are stable. He does have history of aortic dissection status post repair at [facility]. Given his history and reported symptoms, will obtain labs, chest x-ray, and CT to evaluate for progressive dissection and patency of this graft. EKG is nonischemic.  Labwork is reassuring. CT study discussed with radiology, Dr. Vernard Gambles-- scan with stable appearance status post dissection repair with residual descending dissection which is known. There are no new abnormalities noted.   Delta troponin here negative. Patient remains hemodynamically stable. After discussion with his test results, patient appears frustrated stating "I've been seen here multiple times and no one can tell me why I continue having chest pain". I explained to him that this seems to be an ongoing issue based on multiple prior ED visits, but does not appear related to any acute emergent pathology here based on test results today. He appears stable for discharge. I have recommended that he follow-up closely  with his surgeon at [facility] as well as his primary care doctor.  Discussed plan with  patient, he acknowledged understanding and agreed with plan of care.  Return precautions given for new or worsening symptoms.   Final Clinical Impressions(s) / ED Diagnoses   Final diagnoses:  Chest pain, unspecified chest pain type    New Prescriptions New Prescriptions   No medications on file     Larene Pickett, Hershal Coria 11/10/15 1508    Orlie Dakin, MD 11/10/15 1655

## 2015-11-10 NOTE — Discharge Instructions (Signed)
As we discussed, your CT scan did not show any acute changes of aortic repair. Your cardiac enzymes were also negative. I recommend that you follow-up closely with your surgeon at [facility] as well as your primary care doctor. Continue your regular home medications. Return here for any new or worsening symptoms.

## 2015-11-10 NOTE — ED Provider Notes (Signed)
Complains of anterior substernal pleuritic chest pain onset one week ago. Pain is constant worse with deep breathing nonexertional. Pain is improved by drinking cold liquids No treatment prior to coming here. On exam alert no distress lungs clear auscultation heart regular rate and rhythm abdomen nondistended extremities without edema   Orlie Dakin, MD 11/10/15 1507

## 2015-11-10 NOTE — ED Triage Notes (Signed)
Pt. Stated, I ve had chest pain in the middle that goes to my back for the last week. No other symptoms.

## 2015-11-10 NOTE — ED Notes (Signed)
Called CT department to follow up on ETA for CT.  States patient is next to be scanned.

## 2015-11-13 DIAGNOSIS — R0789 Other chest pain: Secondary | ICD-10-CM | POA: Diagnosis not present

## 2015-11-13 DIAGNOSIS — M546 Pain in thoracic spine: Secondary | ICD-10-CM | POA: Diagnosis not present

## 2015-11-16 DIAGNOSIS — M546 Pain in thoracic spine: Secondary | ICD-10-CM | POA: Diagnosis not present

## 2015-12-05 DIAGNOSIS — R0789 Other chest pain: Secondary | ICD-10-CM | POA: Diagnosis not present

## 2015-12-06 DIAGNOSIS — I7103 Dissection of thoracoabdominal aorta: Secondary | ICD-10-CM | POA: Diagnosis not present

## 2015-12-06 DIAGNOSIS — F17210 Nicotine dependence, cigarettes, uncomplicated: Secondary | ICD-10-CM | POA: Diagnosis not present

## 2015-12-06 DIAGNOSIS — Z09 Encounter for follow-up examination after completed treatment for conditions other than malignant neoplasm: Secondary | ICD-10-CM | POA: Diagnosis not present

## 2015-12-06 DIAGNOSIS — Z8679 Personal history of other diseases of the circulatory system: Secondary | ICD-10-CM | POA: Diagnosis not present

## 2015-12-06 DIAGNOSIS — Z95828 Presence of other vascular implants and grafts: Secondary | ICD-10-CM | POA: Diagnosis not present

## 2015-12-06 DIAGNOSIS — I712 Thoracic aortic aneurysm, without rupture: Secondary | ICD-10-CM | POA: Diagnosis not present

## 2015-12-06 DIAGNOSIS — I517 Cardiomegaly: Secondary | ICD-10-CM | POA: Diagnosis not present

## 2015-12-10 ENCOUNTER — Encounter: Payer: Self-pay | Admitting: *Deleted

## 2015-12-16 ENCOUNTER — Ambulatory Visit: Payer: Medicare Other | Admitting: Gastroenterology

## 2016-01-01 IMAGING — CR DG CHEST 2V
2 series · 2 of 2 positions shown · non-contrast
Comparison: DG CHEST 2 VIEW dated 03/04/2013

CLINICAL DATA: Chest and abdominal discomfort for 8 days with his
and constipation and urinary frequency

EXAM:
CHEST  2 VIEW

[w chest pa]
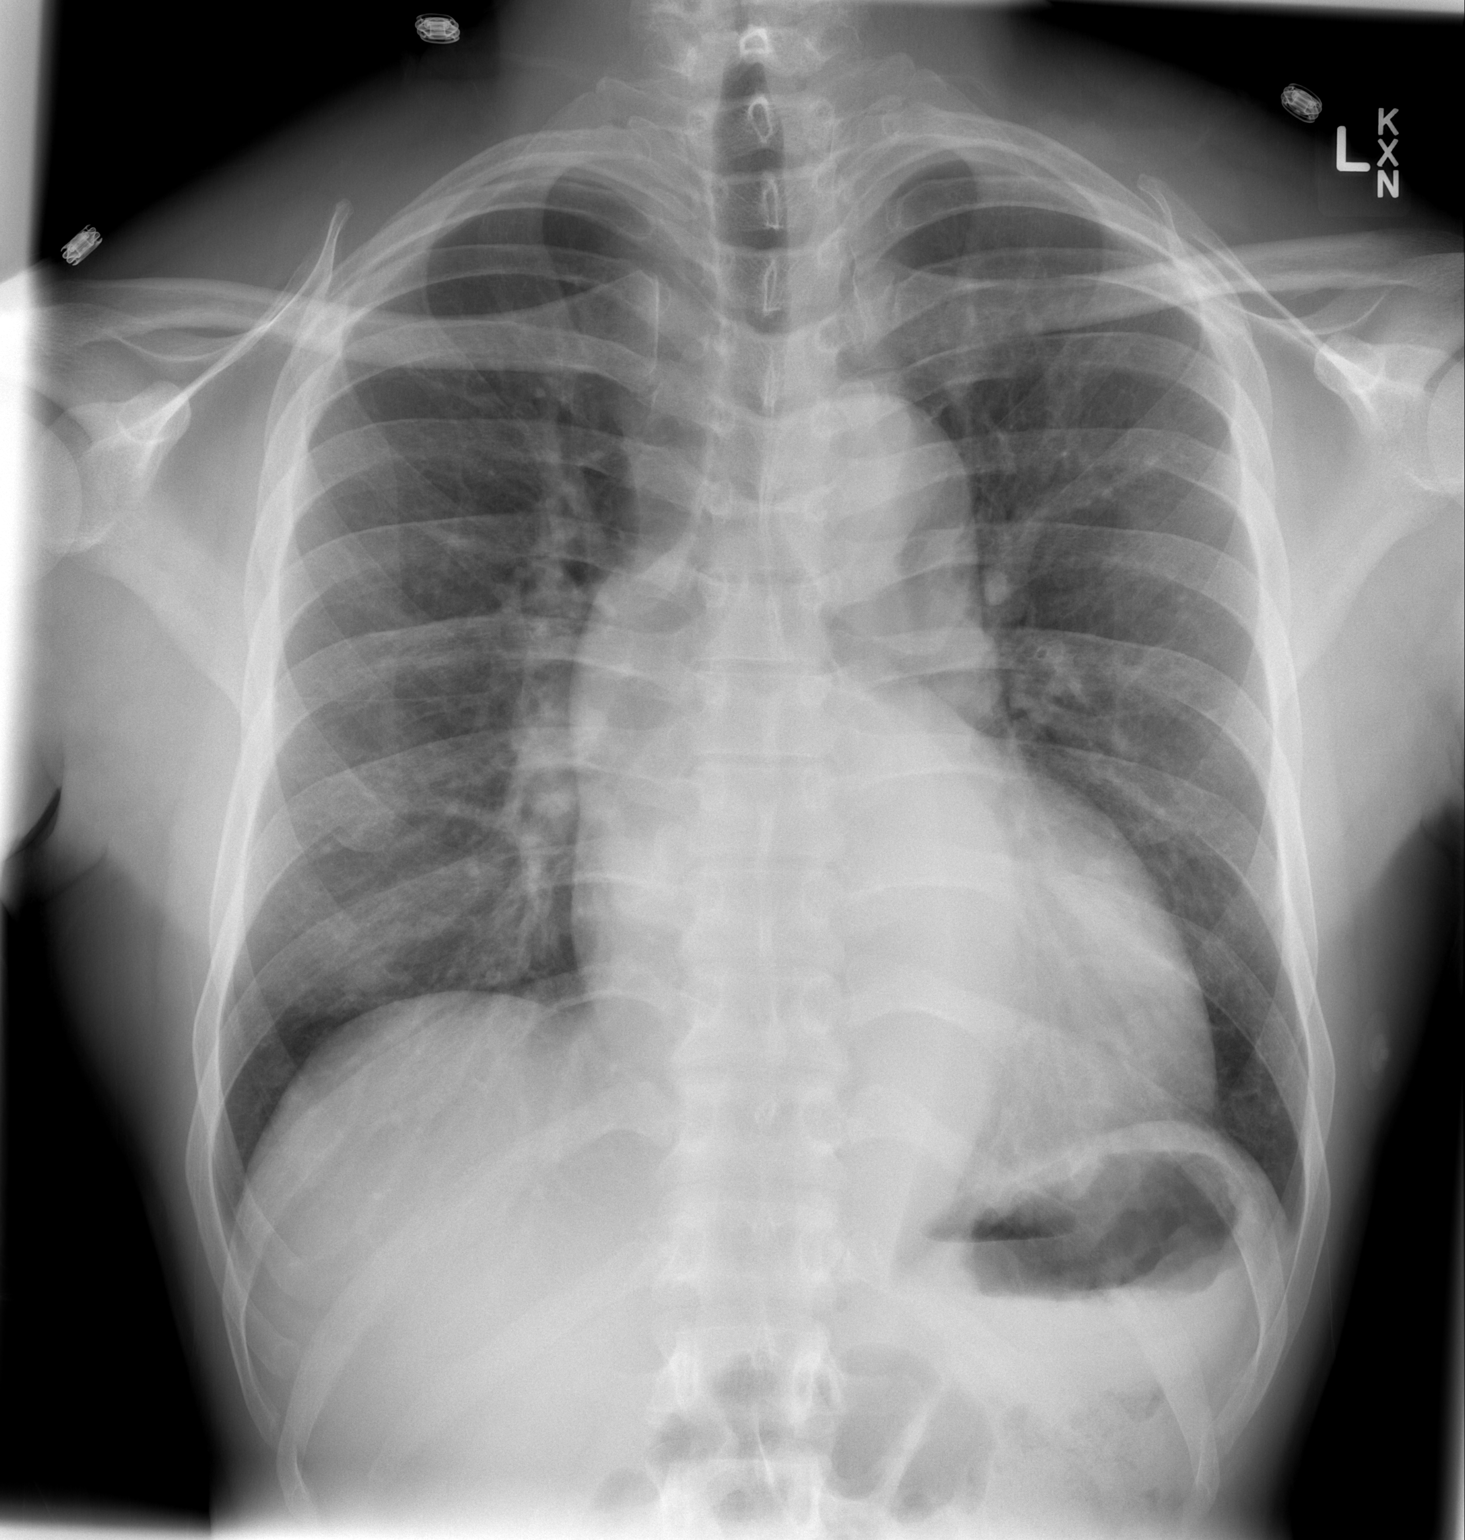

[w chest lat]
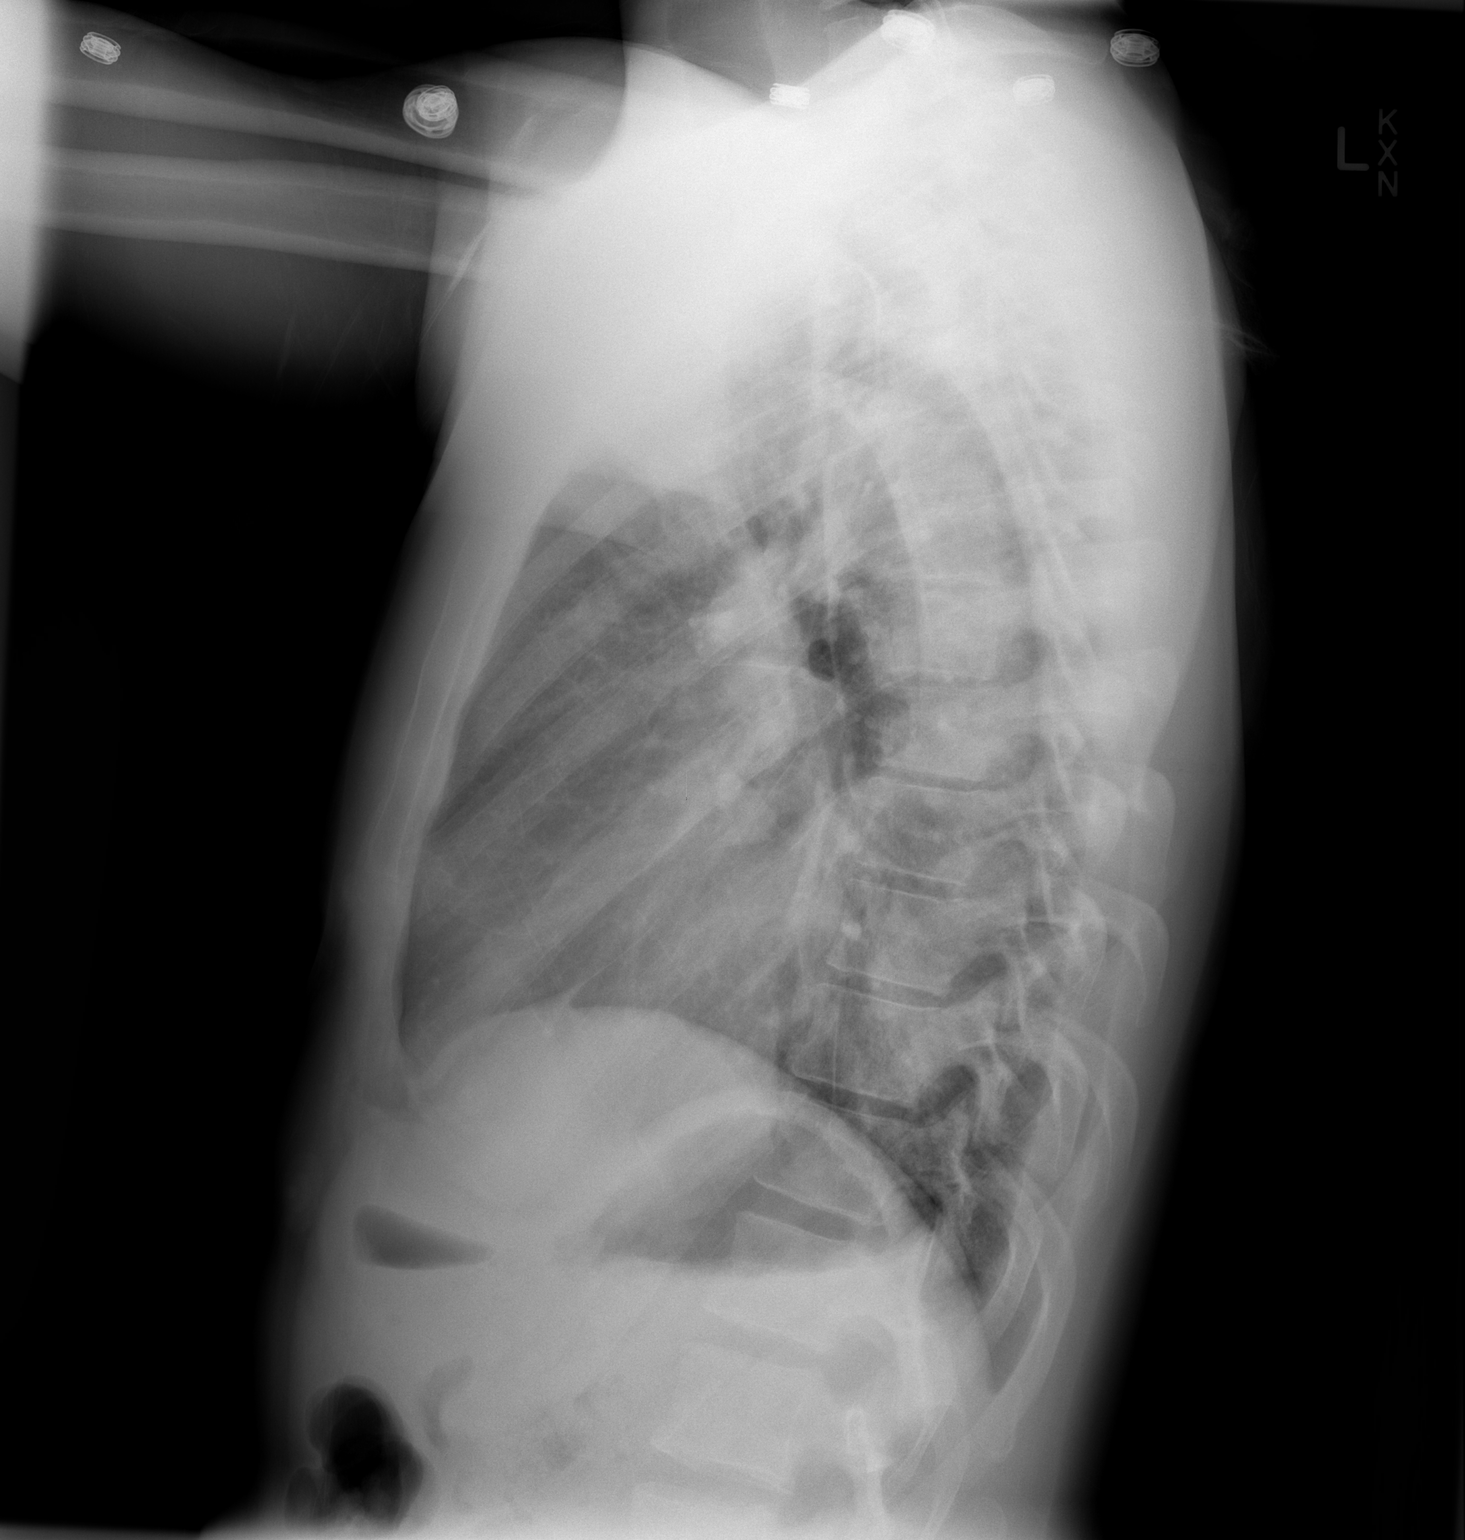

[2 of 2 positions shown; findings below may reference images not displayed]

FINDINGS: The cardiac silhouette remains mildly enlarged. There is tortuosity
of the ascending and descending thoracic aorta. The lungs are
adequately inflated. There is mild prominence of the central
pulmonary vascularity which has developed since the previous study.
The interstitial markings of both lungs also are slightly more
conspicuous. There is no pleural effusion or alveolar infiltrate.
The observed portions of the bony thorax exhibit no acute
abnormalities.
IMPRESSION: The findings may reflect low-grade CHF with mild interstitial edema.
There has been an increase in the cardiac silhouette size since the
previous study.

## 2016-01-02 IMAGING — CT CT HEAD W/O CM
1 series · 16 of 30 positions shown, 20 images · non-contrast
Comparison: 11/13/2011

CLINICAL DATA: Severe headache

EXAM:
CT HEAD WITHOUT CONTRAST
TECHNIQUE: Contiguous axial images were obtained from the base of the skull
through the vertex without intravenous contrast.

[Series 2: head 5.0 h30s · axial · 0.41mm/px · z∈[-158,-18]mm · 16 of 32 slices shown, 20 images]
[im 2/32  brain]
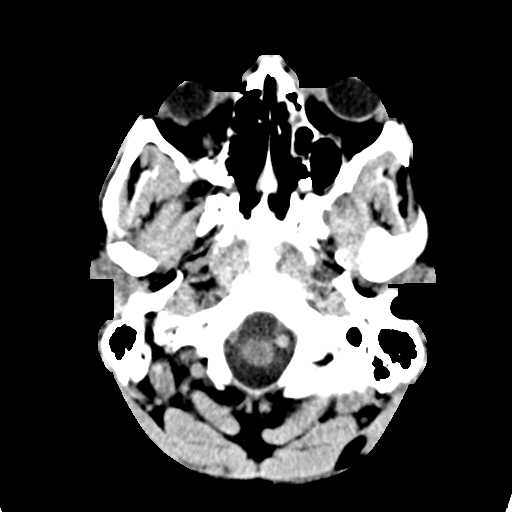
[im 2/32  bone]
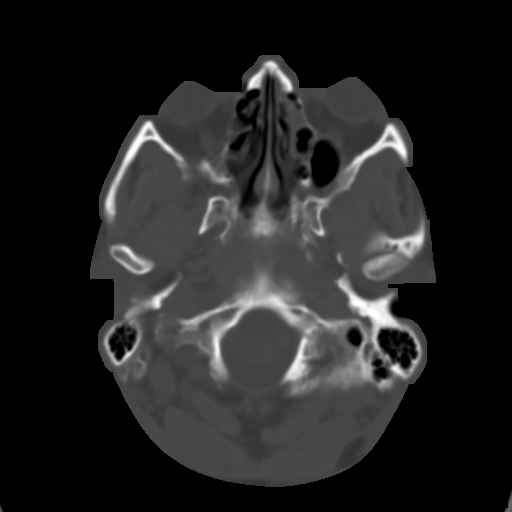
[im 4/32  brain]
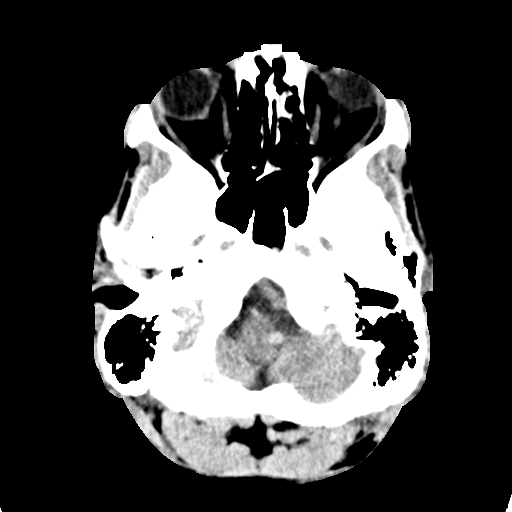
[im 6/32  brain]
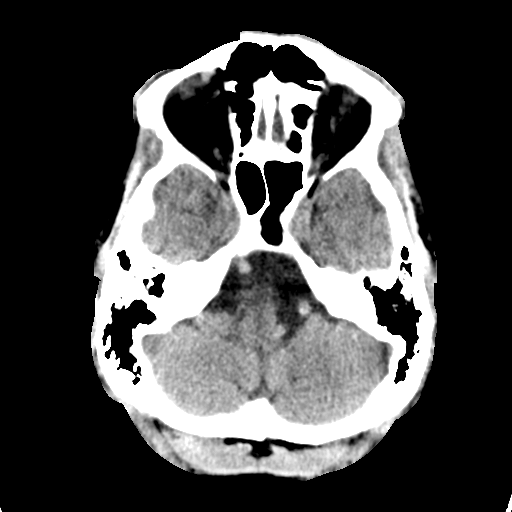
[im 8/32  brain]
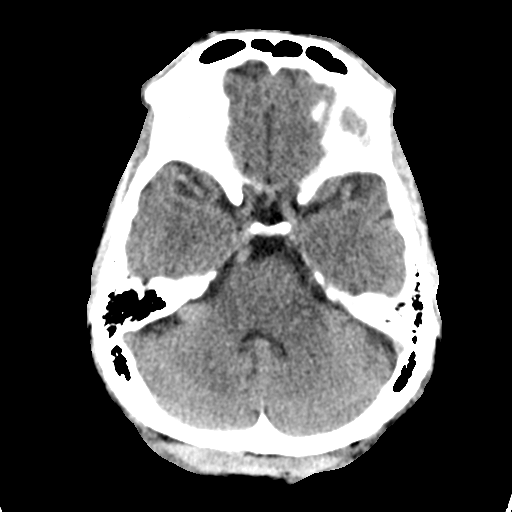
[im 9/32  brain]
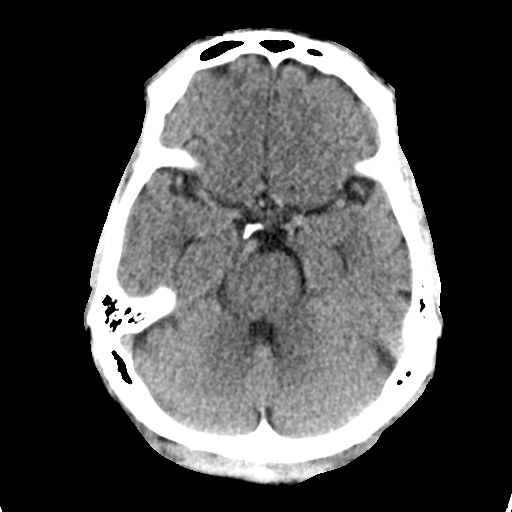
[im 9/32  bone]
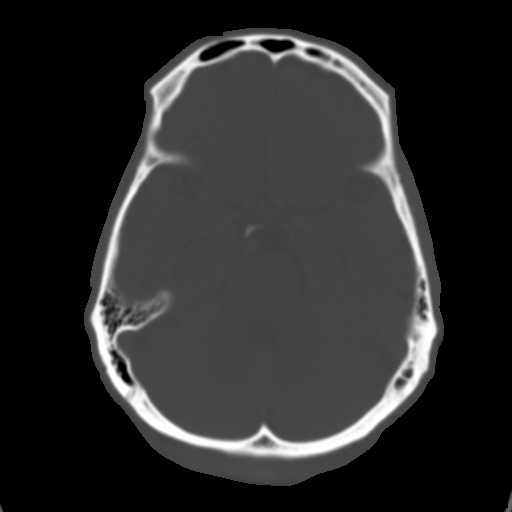
[im 11/32  brain]
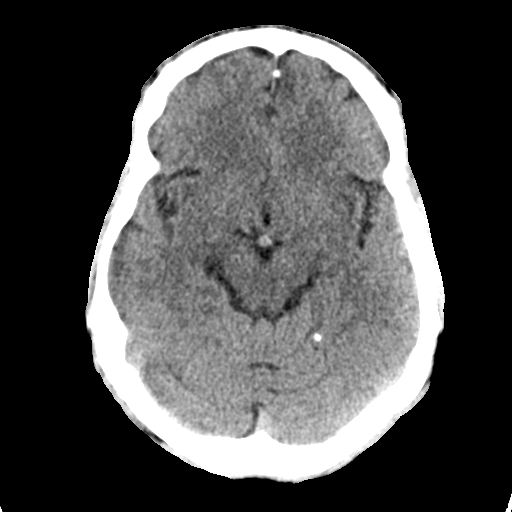
[im 13/32  brain]
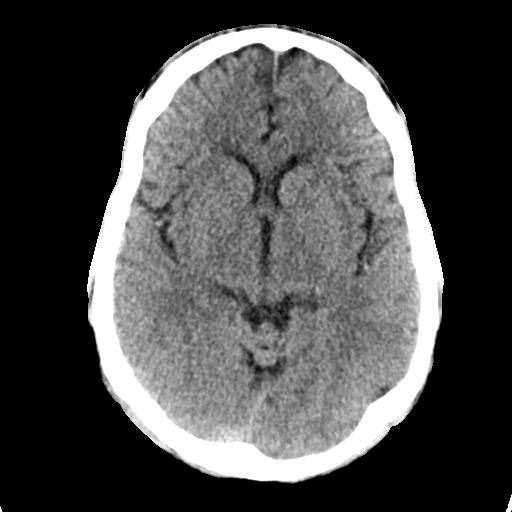
[im 15/32  brain]
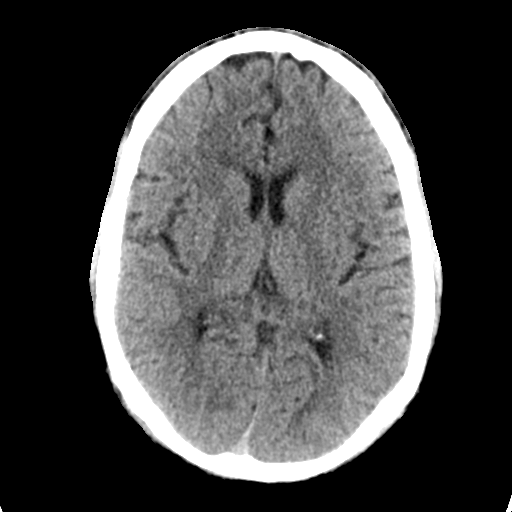
[im 17/32  brain]
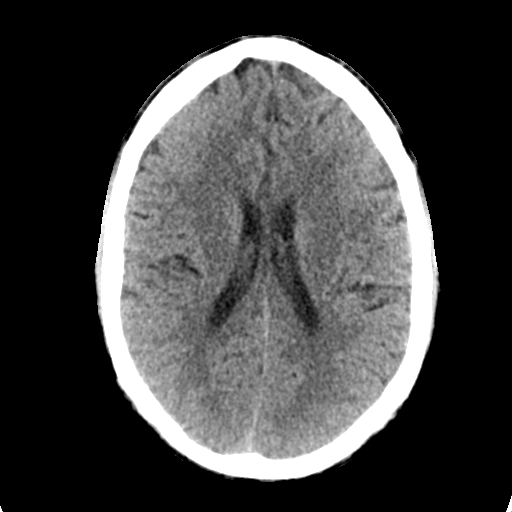
[im 17/32  bone]
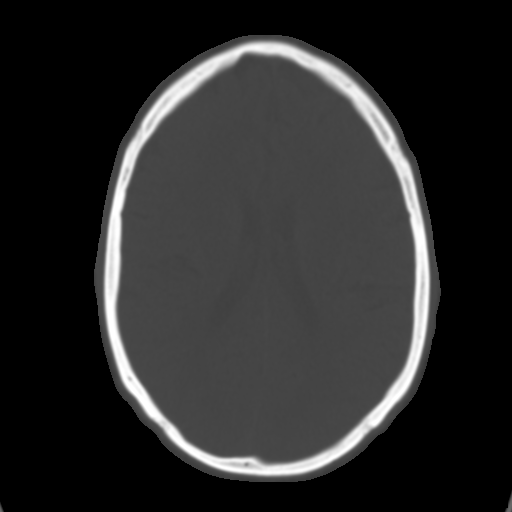
[im 19/32  brain]
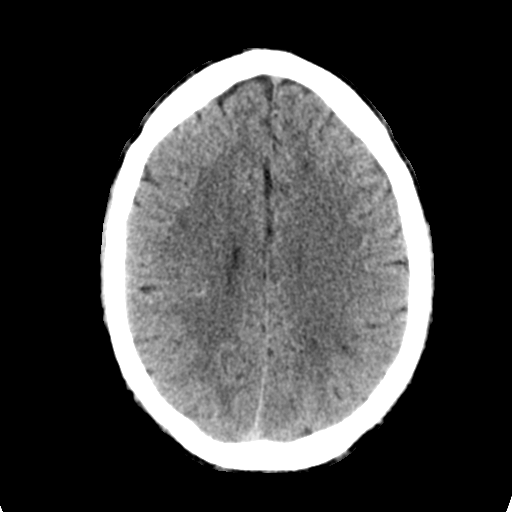
[im 21/32  brain]
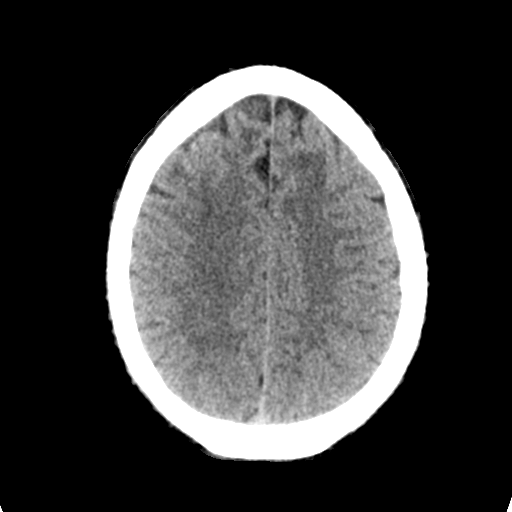
[im 23/32  brain]
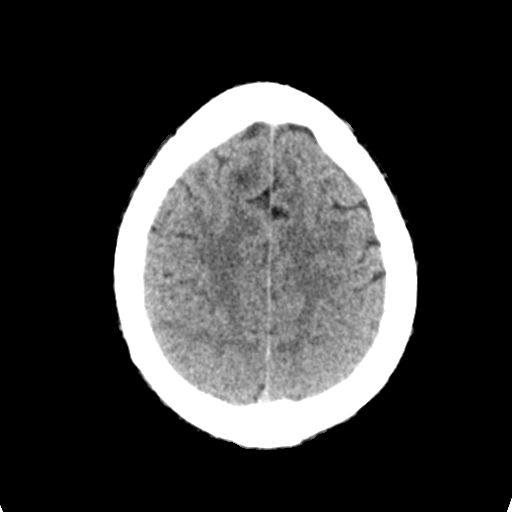
[im 24/32  brain]
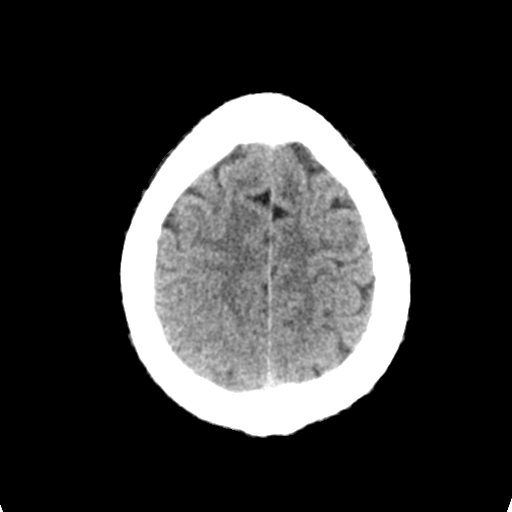
[im 24/32  bone]
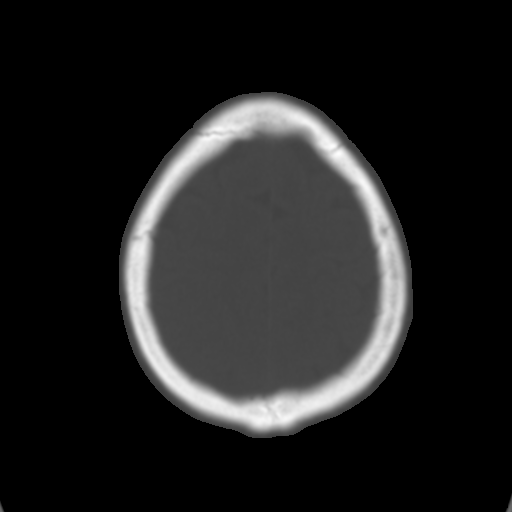
[im 26/32  brain]
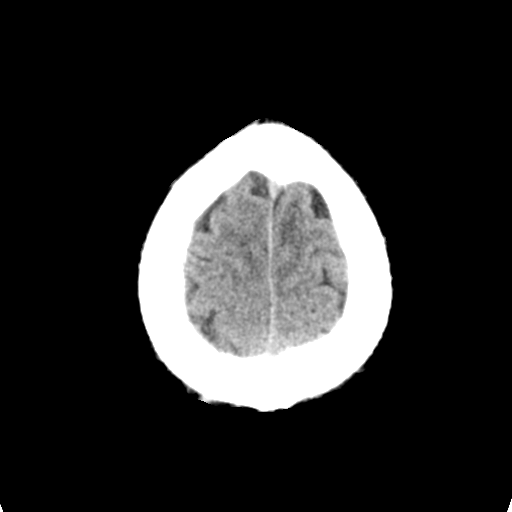
[im 28/32  brain]
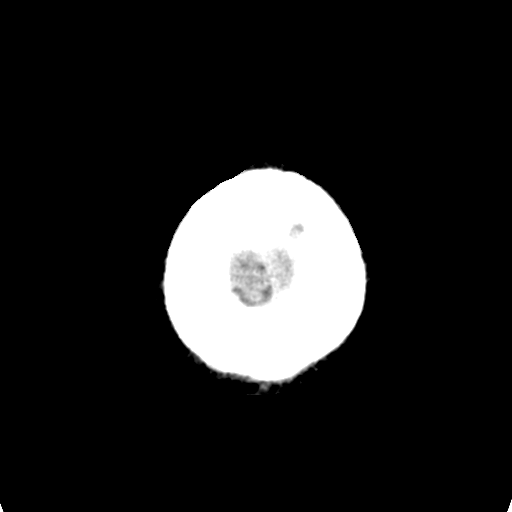
[im 30/32  brain]
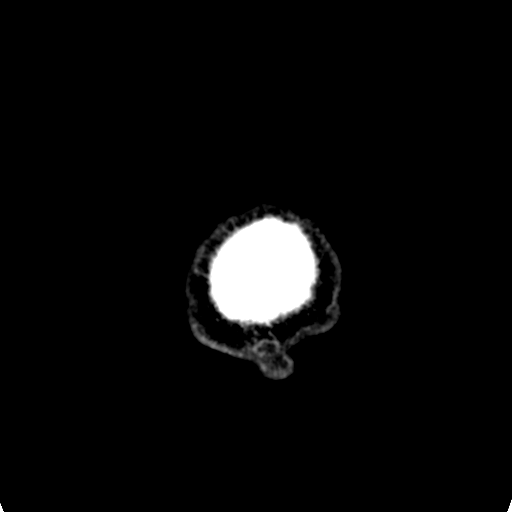

[16 of 30 positions shown; findings below may reference images not displayed]

FINDINGS: Ventricle size is normal. Negative for acute infarct. Mild chronic
changes in the periventricular frontal white matter bilaterally,
similar to the prior study. Negative for hemorrhage or mass.

Mucosal edema in the paranasal sinuses. Prior medial antrostomy on
the right. Chronic right medial orbital fracture is unchanged. No
acute bony change.
IMPRESSION: Mild chronic microvascular ischemic change in the white matter. No
acute intracranial abnormality.

Chronic sinusitis

## 2016-02-28 ENCOUNTER — Inpatient Hospital Stay (HOSPITAL_COMMUNITY)
Admission: EM | Admit: 2016-02-28 | Discharge: 2016-03-06 | DRG: 064 | Disposition: A | Discharge status: Home / Self Care | Payer: Medicare Other | Attending: Internal Medicine | Admitting: Internal Medicine

## 2016-02-28 ENCOUNTER — Emergency Department (HOSPITAL_COMMUNITY): Payer: Medicare Other

## 2016-02-28 ENCOUNTER — Inpatient Hospital Stay (HOSPITAL_COMMUNITY): Payer: Medicare Other

## 2016-02-28 ENCOUNTER — Encounter (HOSPITAL_COMMUNITY): Payer: Self-pay | Admitting: Emergency Medicine

## 2016-02-28 DIAGNOSIS — J9601 Acute respiratory failure with hypoxia: Secondary | ICD-10-CM | POA: Diagnosis not present

## 2016-02-28 DIAGNOSIS — D649 Anemia, unspecified: Secondary | ICD-10-CM | POA: Diagnosis present

## 2016-02-28 DIAGNOSIS — F1910 Other psychoactive substance abuse, uncomplicated: Secondary | ICD-10-CM | POA: Diagnosis present

## 2016-02-28 DIAGNOSIS — I429 Cardiomyopathy, unspecified: Secondary | ICD-10-CM | POA: Diagnosis present

## 2016-02-28 DIAGNOSIS — J690 Pneumonitis due to inhalation of food and vomit: Secondary | ICD-10-CM | POA: Diagnosis present

## 2016-02-28 DIAGNOSIS — R001 Bradycardia, unspecified: Secondary | ICD-10-CM | POA: Diagnosis not present

## 2016-02-28 DIAGNOSIS — I161 Hypertensive emergency: Secondary | ICD-10-CM | POA: Diagnosis present

## 2016-02-28 DIAGNOSIS — N184 Chronic kidney disease, stage 4 (severe): Secondary | ICD-10-CM | POA: Diagnosis not present

## 2016-02-28 DIAGNOSIS — Z79899 Other long term (current) drug therapy: Secondary | ICD-10-CM

## 2016-02-28 DIAGNOSIS — J9690 Respiratory failure, unspecified, unspecified whether with hypoxia or hypercapnia: Secondary | ICD-10-CM

## 2016-02-28 DIAGNOSIS — Z4682 Encounter for fitting and adjustment of non-vascular catheter: Secondary | ICD-10-CM | POA: Diagnosis not present

## 2016-02-28 DIAGNOSIS — G9349 Other encephalopathy: Secondary | ICD-10-CM | POA: Diagnosis present

## 2016-02-28 DIAGNOSIS — I6602 Occlusion and stenosis of left middle cerebral artery: Secondary | ICD-10-CM | POA: Diagnosis not present

## 2016-02-28 DIAGNOSIS — I5042 Chronic combined systolic (congestive) and diastolic (congestive) heart failure: Secondary | ICD-10-CM | POA: Diagnosis present

## 2016-02-28 DIAGNOSIS — R4701 Aphasia: Secondary | ICD-10-CM | POA: Diagnosis present

## 2016-02-28 DIAGNOSIS — R002 Palpitations: Secondary | ICD-10-CM

## 2016-02-28 DIAGNOSIS — Z8249 Family history of ischemic heart disease and other diseases of the circulatory system: Secondary | ICD-10-CM

## 2016-02-28 DIAGNOSIS — I469 Cardiac arrest, cause unspecified: Secondary | ICD-10-CM | POA: Diagnosis not present

## 2016-02-28 DIAGNOSIS — N183 Chronic kidney disease, stage 3 (moderate): Secondary | ICD-10-CM | POA: Diagnosis present

## 2016-02-28 DIAGNOSIS — G8191 Hemiplegia, unspecified affecting right dominant side: Secondary | ICD-10-CM | POA: Diagnosis present

## 2016-02-28 DIAGNOSIS — Z7982 Long term (current) use of aspirin: Secondary | ICD-10-CM

## 2016-02-28 DIAGNOSIS — R0609 Other forms of dyspnea: Secondary | ICD-10-CM | POA: Diagnosis not present

## 2016-02-28 DIAGNOSIS — Z5309 Procedure and treatment not carried out because of other contraindication: Secondary | ICD-10-CM | POA: Diagnosis not present

## 2016-02-28 DIAGNOSIS — F17210 Nicotine dependence, cigarettes, uncomplicated: Secondary | ICD-10-CM | POA: Diagnosis present

## 2016-02-28 DIAGNOSIS — G9340 Encephalopathy, unspecified: Secondary | ICD-10-CM | POA: Diagnosis not present

## 2016-02-28 DIAGNOSIS — R402421 Glasgow coma scale score 9-12, in the field [EMT or ambulance]: Secondary | ICD-10-CM | POA: Diagnosis not present

## 2016-02-28 DIAGNOSIS — E440 Moderate protein-calorie malnutrition: Secondary | ICD-10-CM | POA: Diagnosis not present

## 2016-02-28 DIAGNOSIS — I2510 Atherosclerotic heart disease of native coronary artery without angina pectoris: Secondary | ICD-10-CM | POA: Diagnosis present

## 2016-02-28 DIAGNOSIS — E785 Hyperlipidemia, unspecified: Secondary | ICD-10-CM | POA: Diagnosis present

## 2016-02-28 DIAGNOSIS — D696 Thrombocytopenia, unspecified: Secondary | ICD-10-CM | POA: Diagnosis present

## 2016-02-28 DIAGNOSIS — R569 Unspecified convulsions: Secondary | ICD-10-CM | POA: Diagnosis not present

## 2016-02-28 DIAGNOSIS — Z952 Presence of prosthetic heart valve: Secondary | ICD-10-CM

## 2016-02-28 DIAGNOSIS — G40909 Epilepsy, unspecified, not intractable, without status epilepticus: Secondary | ICD-10-CM | POA: Diagnosis not present

## 2016-02-28 DIAGNOSIS — I130 Hypertensive heart and chronic kidney disease with heart failure and stage 1 through stage 4 chronic kidney disease, or unspecified chronic kidney disease: Secondary | ICD-10-CM | POA: Diagnosis present

## 2016-02-28 DIAGNOSIS — K219 Gastro-esophageal reflux disease without esophagitis: Secondary | ICD-10-CM | POA: Diagnosis present

## 2016-02-28 DIAGNOSIS — I639 Cerebral infarction, unspecified: Secondary | ICD-10-CM | POA: Diagnosis not present

## 2016-02-28 DIAGNOSIS — R109 Unspecified abdominal pain: Secondary | ICD-10-CM

## 2016-02-28 DIAGNOSIS — K8020 Calculus of gallbladder without cholecystitis without obstruction: Secondary | ICD-10-CM | POA: Diagnosis not present

## 2016-02-28 DIAGNOSIS — Z4659 Encounter for fitting and adjustment of other gastrointestinal appliance and device: Secondary | ICD-10-CM

## 2016-02-28 DIAGNOSIS — I638 Other cerebral infarction: Secondary | ICD-10-CM | POA: Diagnosis not present

## 2016-02-28 DIAGNOSIS — I63412 Cerebral infarction due to embolism of left middle cerebral artery: Principal | ICD-10-CM | POA: Diagnosis present

## 2016-02-28 DIAGNOSIS — J988 Other specified respiratory disorders: Secondary | ICD-10-CM | POA: Diagnosis not present

## 2016-02-28 DIAGNOSIS — R29818 Other symptoms and signs involving the nervous system: Secondary | ICD-10-CM | POA: Diagnosis not present

## 2016-02-28 DIAGNOSIS — Z86010 Personal history of colonic polyps: Secondary | ICD-10-CM

## 2016-02-28 DIAGNOSIS — I10 Essential (primary) hypertension: Secondary | ICD-10-CM | POA: Diagnosis not present

## 2016-02-28 DIAGNOSIS — R1084 Generalized abdominal pain: Secondary | ICD-10-CM | POA: Diagnosis not present

## 2016-02-28 LAB — I-STAT ARTERIAL BLOOD GAS, ED
Acid-base deficit: 12 mmol/L — ABNORMAL HIGH (ref 0.0–2.0)
Bicarbonate: 18.1 mmol/L — ABNORMAL LOW (ref 20.0–28.0)
O2 SAT: 100 %
PCO2 ART: 58 mmHg — AB (ref 32.0–48.0)
PH ART: 7.103 — AB (ref 7.350–7.450)
PO2 ART: 241 mmHg — AB (ref 83.0–108.0)
Patient temperature: 98.6
TCO2: 20 mmol/L (ref 0–100)

## 2016-02-28 LAB — COMPREHENSIVE METABOLIC PANEL
ALBUMIN: 3.4 g/dL — AB (ref 3.5–5.0)
ALT: 21 U/L (ref 17–63)
AST: 22 U/L (ref 15–41)
Alkaline Phosphatase: 97 U/L (ref 38–126)
Anion gap: 7 (ref 5–15)
BUN: 13 mg/dL (ref 6–20)
CHLORIDE: 107 mmol/L (ref 101–111)
CO2: 26 mmol/L (ref 22–32)
CREATININE: 1.55 mg/dL — AB (ref 0.61–1.24)
Calcium: 8.9 mg/dL (ref 8.9–10.3)
GFR calc Af Amer: >60 mL/min (ref >60)
GFR, EST NON AFRICAN AMERICAN: 53 mL/min — AB (ref >60)
GLUCOSE: 104 mg/dL — AB (ref 65–99)
POTASSIUM: 3.8 mmol/L (ref 3.5–5.1)
Sodium: 140 mmol/L (ref 135–145)
Total Bilirubin: 0.8 mg/dL (ref 0.3–1.2)
Total Protein: 6.8 g/dL (ref 6.5–8.1)

## 2016-02-28 LAB — PROTIME-INR
INR: 1.08
Prothrombin Time: 14.1 seconds (ref 11.4–15.2)

## 2016-02-28 LAB — I-STAT CHEM 8, ED
BUN: 15 mg/dL (ref 6–20)
CALCIUM ION: 1.15 mmol/L (ref 1.15–1.40)
CHLORIDE: 106 mmol/L (ref 101–111)
Creatinine, Ser: 1.6 mg/dL — ABNORMAL HIGH (ref 0.61–1.24)
Glucose, Bld: 101 mg/dL — ABNORMAL HIGH (ref 65–99)
HEMATOCRIT: 42 % (ref 39.0–52.0)
Hemoglobin: 14.3 g/dL (ref 13.0–17.0)
POTASSIUM: 3.9 mmol/L (ref 3.5–5.1)
SODIUM: 140 mmol/L (ref 135–145)
TCO2: 23 mmol/L (ref 0–100)

## 2016-02-28 LAB — CBC
HEMATOCRIT: 40.3 % (ref 39.0–52.0)
HEMOGLOBIN: 13.4 g/dL (ref 13.0–17.0)
MCH: 30.7 pg (ref 26.0–34.0)
MCHC: 33.3 g/dL (ref 30.0–36.0)
MCV: 92.4 fL (ref 78.0–100.0)
Platelets: 115 10*3/uL — ABNORMAL LOW (ref 150–400)
RBC: 4.36 MIL/uL (ref 4.22–5.81)
RDW: 14.3 % (ref 11.5–15.5)
WBC: 6.3 10*3/uL (ref 4.0–10.5)

## 2016-02-28 LAB — APTT: APTT: 33 s (ref 24–36)

## 2016-02-28 LAB — DIFFERENTIAL
BASOS ABS: 0 10*3/uL (ref 0.0–0.1)
BASOS PCT: 1 %
EOS ABS: 0.2 10*3/uL (ref 0.0–0.7)
Eosinophils Relative: 3 %
Lymphocytes Relative: 50 %
Lymphs Abs: 3.1 10*3/uL (ref 0.7–4.0)
MONOS PCT: 9 %
Monocytes Absolute: 0.6 10*3/uL (ref 0.1–1.0)
NEUTROS ABS: 2.4 10*3/uL (ref 1.7–7.7)
NEUTROS PCT: 37 %

## 2016-02-28 LAB — I-STAT TROPONIN, ED: TROPONIN I, POC: 0.15 ng/mL — AB (ref 0.00–0.08)

## 2016-02-28 LAB — CBG MONITORING, ED: GLUCOSE-CAPILLARY: 113 mg/dL — AB (ref 65–99)

## 2016-02-28 MED ORDER — MIDAZOLAM HCL 2 MG/2ML IJ SOLN
5.0000 mg | INTRAMUSCULAR | Status: DC | PRN
  Administered 2016-02-28: 5 mg via INTRAVENOUS

## 2016-02-28 MED ORDER — FENTANYL CITRATE (PF) 100 MCG/2ML IJ SOLN
INTRAMUSCULAR | Status: AC
  Administered 2016-02-28: 50 ug
  Filled 2016-02-28: qty 4

## 2016-02-28 MED ORDER — SODIUM CHLORIDE 0.9 % IV SOLN
500.0000 mg | Freq: Two times a day (BID) | INTRAVENOUS | Status: DC
  Administered 2016-02-29 – 2016-03-06 (×12): 500 mg via INTRAVENOUS
  Filled 2016-02-28 (×14): qty 5

## 2016-02-28 MED ORDER — HEPARIN SODIUM (PORCINE) 5000 UNIT/ML IJ SOLN
5000.0000 [IU] | Freq: Three times a day (TID) | INTRAMUSCULAR | Status: DC
  Administered 2016-02-29 – 2016-03-02 (×7): 5000 [IU] via SUBCUTANEOUS
  Filled 2016-02-28 (×7): qty 1

## 2016-02-28 MED ORDER — FENTANYL CITRATE (PF) 100 MCG/2ML IJ SOLN
50.0000 ug | INTRAMUSCULAR | Status: DC | PRN
  Administered 2016-02-29: 50 ug via INTRAVENOUS
  Filled 2016-02-28: qty 2

## 2016-02-28 MED ORDER — HYDRALAZINE HCL 50 MG PO TABS: 50.0000 mg | ORAL_TABLET | Freq: Two times a day (BID) | ORAL | Status: DC

## 2016-02-28 MED ORDER — ETOMIDATE 2 MG/ML IV SOLN
INTRAVENOUS | Status: AC | PRN
  Administered 2016-02-28: 20 mg via INTRAVENOUS

## 2016-02-28 MED ORDER — IOPAMIDOL (ISOVUE-370) INJECTION 76%
50.0000 mL | Freq: Once | INTRAVENOUS | Status: AC | PRN
  Administered 2016-02-28: 50 mL via INTRAVENOUS

## 2016-02-28 MED ORDER — LABETALOL HCL 5 MG/ML IV SOLN
INTRAVENOUS | Status: AC
  Administered 2016-02-28: 20 mg
  Filled 2016-02-28: qty 4

## 2016-02-28 MED ORDER — AMLODIPINE BESYLATE 10 MG PO TABS: 10.0000 mg | ORAL_TABLET | Freq: Every day | ORAL | Status: DC

## 2016-02-28 MED ORDER — LEVETIRACETAM 500 MG/5ML IV SOLN
1000.0000 mg | Freq: Once | INTRAVENOUS | Status: AC
  Administered 2016-02-28: 1000 mg via INTRAVENOUS
  Filled 2016-02-28: qty 10

## 2016-02-28 MED ORDER — MIDAZOLAM HCL 2 MG/2ML IJ SOLN
INTRAMUSCULAR | Status: AC
  Filled 2016-02-28: qty 4

## 2016-02-28 MED ORDER — MIDAZOLAM HCL 2 MG/2ML IJ SOLN: 1.0000 mg | INTRAMUSCULAR | Status: DC | PRN

## 2016-02-28 MED ORDER — EPINEPHRINE PF 1 MG/10ML IJ SOSY
PREFILLED_SYRINGE | INTRAMUSCULAR | Status: AC | PRN
  Administered 2016-02-28: 1 mg via INTRAVENOUS

## 2016-02-28 MED ORDER — MIDAZOLAM HCL 2 MG/2ML IJ SOLN
INTRAMUSCULAR | Status: AC
  Administered 2016-02-28: 5 mg via INTRAVENOUS
  Filled 2016-02-28: qty 2

## 2016-02-28 MED ORDER — LORAZEPAM 2 MG/ML IJ SOLN
INTRAMUSCULAR | Status: AC
  Administered 2016-02-28: 21:00:00
  Filled 2016-02-28: qty 1

## 2016-02-28 MED ORDER — NICARDIPINE HCL IN NACL 20-0.86 MG/200ML-% IV SOLN
INTRAVENOUS | Status: AC
  Filled 2016-02-28: qty 200

## 2016-02-28 MED ORDER — PROPOFOL 1000 MG/100ML IV EMUL
INTRAVENOUS | Status: AC
  Administered 2016-02-28: 60 ug/kg/min via INTRAVENOUS
  Filled 2016-02-28: qty 100

## 2016-02-28 MED ORDER — PROPOFOL 1000 MG/100ML IV EMUL
5.0000 ug/kg/min | Freq: Once | INTRAVENOUS | Status: AC
  Administered 2016-02-28: 60 ug/kg/min via INTRAVENOUS

## 2016-02-28 MED ORDER — CLONIDINE HCL 0.1 MG PO TABS: 0.2000 mg | ORAL_TABLET | Freq: Three times a day (TID) | ORAL | Status: DC

## 2016-02-28 MED ORDER — SUCCINYLCHOLINE CHLORIDE 20 MG/ML IJ SOLN
INTRAMUSCULAR | Status: AC | PRN
  Administered 2016-02-28: 70 mg via INTRAVENOUS

## 2016-02-28 MED ORDER — SODIUM CHLORIDE 0.9 % IV SOLN: 250.0000 mL | INTRAVENOUS | Status: DC | PRN

## 2016-02-28 MED ORDER — FENTANYL CITRATE (PF) 100 MCG/2ML IJ SOLN
100.0000 ug | INTRAMUSCULAR | Status: DC | PRN
  Administered 2016-02-28: 100 ug via INTRAVENOUS

## 2016-02-28 MED ORDER — NICARDIPINE HCL IN NACL 20-0.86 MG/200ML-% IV SOLN
3.0000 mg/h | INTRAVENOUS | Status: DC
  Administered 2016-02-28: 5 mg/h via INTRAVENOUS
  Administered 2016-02-29: 10 mg/h via INTRAVENOUS
  Filled 2016-02-28 (×2): qty 200

## 2016-02-28 NOTE — Code Documentation (Addendum)
Pt brady down and pulseless, CPR started

## 2016-02-28 NOTE — ED Notes (Signed)
Pt moving both upper extremities toward ET tube, propofol bolus given for sedation, cardene stopped at this time

## 2016-02-28 NOTE — ED Provider Notes (Signed)
[facility] DEPT Provider Note   CSN: [id number] Arrival date & time: 02/28/16  2001     History   Chief Complaint Chief Complaint  Patient presents with  . Code Stroke    HPI [name] is a 45 y.o. male.  The history is provided by the EMS personnel and medical records. The history is limited by the condition of the patient. No language interpreter was used.    Patient is a 45 year old male who presents today as a code stroke by EMS. Last seen normal was at 4 PM today by family. He was found by family to be less responsive. EMS had noted that the patient had worsening aphagia. Additionally had right upper and lower extremity weakness. History is limited due to lack of family present and patient also has altered mental status. CODE STATUS was called prior to patient's arrival. Per review the patient does have a history of aortic disease and has a valve replacement. He also has a history of aortic dissection which is a remote finding. Have been reported to use marijuana earlier today. Has a history of CT D as well.  Past Medical History:  Diagnosis Date  . Adenomatous colon polyp 08/2015  . Anxiety   . Aortic disease ([place])   . Aortic dissection (HCC)    a. admx 04/2014 >> L renal infarct; a/c renal failure >> b.  s/p Bioprosthetic [place] and total arch replacement and staged endovascular repair of descending aortic aneurysm ([facility] - Dr. [name])  . CAD (coronary artery disease)    a. LHC 4/16:  oD1 60%  . Cardiomyopathy ([place])    a. non-ischemic - probably related to untreated HTN and ETOH abuse - Echo 3/13 with EF 35-40% >> b. Echo 4/16: Severe LVH, EF 55-60%, moderate AI, moderate MR, mild LAE, trivial effusion, known type B dissection with communication between true and false lumens with suprasternal images suggesting dissection plane may propagate to at least left subclavian takeoff, root above aortic valve okay    . Chronic abdominal pain   . Chronic combined systolic and  diastolic congestive heart failure ([place])    a. 05/2011: Adm with pulm edema/HTN urgency, EF 35-40% with diffuse hypokinesis and moderate to severe mitral regurgitation. Cardiomyopathy likely due to uncontrolled HTN and ETOH abuse - cath deferred due to renal insufficiency (felt due to uncontrolled HTN). bJodie Echevaria MV 06/2011: EF 37% and no ischemia or infarction. c. EF 45-50% by echo 01/2012.  [name] Chronic sinusitis   . CKD (chronic kidney disease)    a. 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  peak creatinine 3.46 during admx for aortic dissection 2/16.  sees Dr [name]  . Colon polyps 09/11/2015   Descending, sigmoid polyps  . DDD (degenerative disc disease), lumbar   . Descending thoracic aortic aneurysm ([place])   . Dissecting aneurysm of thoracic aorta ([place])   . ETOH abuse    a. Reported to have quit 05/2011.  [name] GERD (gastroesophageal reflux disease)   . Headache(784.0)    "q other day" (08/08/2013)  . Heart murmur   . Hemorrhoid thrombosis   . History of echocardiogram    Echo 1/17:  Severe LVH, EF 55-60%, no RWMA, Gr 2 DD, AVR ok, mild to mod MR, mild LAE, mild reduced RVSF, mod RAE  . History of medication noncompliance   . HYPERLIPIDEMIA   . Hypertension    a. Hx of HTN urgency secondary to noncompliance. b. urinary metanephrine and catecholeamine levels normal 2013.  c. Renal  art [name] 1/16:  No evidence of renal artery stenosis noted bilaterally.  . INGUINAL HERNIA   . Pneumonia ~ 2013  . Renal insufficiency   . Tobacco abuse   . Valvular heart disease    a. Echo 05/2011: moderate to severe eccentric MR and mild to moderate AI with prolapsing left coronary cusp. b. Echo 01/2012: mild-mod AI, mild dilitation of aortic root, mild MR.;  c. Echo 1/16: Severe LVH consistent with hypertrophic cardio myopathy, EF 50%, no RWMA, mod AI, mild MR, mild RAE, dilated Ao root (40 mm);     Patient Active Problem List   Diagnosis Date Noted  . HLD (hyperlipidemia) 08/28/2015  . Thrombosed hemorrhoids  06/14/2015  . Rectal bleeding 06/14/2015  . Rectal pain 06/14/2015  . 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[place]) 06/07/2015  . Cardiomyopathy ([place]) 03/01/2015  . Acid reflux 03/01/2015  . BP (high blood pressure) 03/01/2015  . Cardiac chest pain 12/10/2014  . Abdominal pain 10/12/2014  . Injury of kidney 10/03/2014  . History of aortic arch replacement 10/03/2014  . H/O aortic valve replacement 10/03/2014  . Pre-op testing 06/15/2014  . CKD (chronic kidney disease) 05/15/2014  . Hypoxia   . AKI (acute kidney injury) ([place])   . Aortic dissection, thoracoabdominal ([place])   . 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 Aortic dissection, thoracic ([place]) 04/24/2014  . Aortic dissection ([place]) 04/23/2014  . Aneurysm of ascending aorta (HCC) 03/28/2014  . Dyspnea 03/27/2014  . Prolonged Q-T interval on ECG 03/27/2014  . ETOH abuse 09/20/2013  . Hypertensive crisis 08/08/2013  . Sinusitis, bacterial 08/08/2013  . Malignant hypertension 02/20/2013  . Hypertensive urgency 01/27/2012  . Cardiomyopathy, hypertensive ([place]) 01/26/2012  . AI (aortic insufficiency) 01/26/2012  . MR (mitral regurgitation) 01/26/2012  . Acute renal failure superimposed on stage 3 chronic kidney disease ([place]) 01/26/2012  . Systolic CHF, chronic ([place]) 06/10/2011  . Chronic bronchitis 05/23/2011  . Hypertensive emergency 05/22/2011  . Cardiomegaly - hypertensive 05/22/2011  . Tobacco abuse 05/22/2011  . Marijuana use 05/22/2011  . Abdominal pain 05/22/2011  . Hypertension, malignant 05/22/2011  . Anxiety and depression 05/22/2011  . Hyperlipidemia 08/26/2009  . Tobacco use 08/26/2009  . Headache(784.0) 08/26/2009  . Aortic valve disorder 03/11/2009  . INGUINAL HERNIA 02/18/2009  . Uncontrolled hypertension 01/16/2009  . MURMUR 01/16/2009    Past Surgical History:  Procedure Laterality Date  . ANKLE SURGERY Bilateral    Fractures bilaterally  . AORTIC VALVE SURGERY  09/2014    . FOOT FRACTURE SURGERY Bilateral 2004-2010   "got pins in both of them"  . HEMORRHOID SURGERY N/A 06/15/2015   Procedure: HEMORRHOIDECTOMY;  Surgeon: Stark Klein, MD;  Location: [facility];  Service: General;  Laterality: N/A;  . INGUINAL HERNIA REPAIR Right ~ 1996  . LEFT HEART CATHETERIZATION WITH CORONARY ANGIOGRAM N/A 06/21/2014   Procedure: LEFT HEART CATHETERIZATION WITH CORONARY ANGIOGRAM;  Surgeon: Larey Dresser, MD;  Location: [facility] CATH LAB;  Service: Cardiovascular;  Laterality: N/A;       Home Medications    Prior to Admission medications   Medication Sig Start Date End Date Taking? Authorizing Provider  acetaminophen (TYLENOL) 500 MG tablet Take 1,000 mg by mouth 3 (three) times daily.    Historical Provider, MD  acetaminophen-codeine (TYLENOL #3) 300-30 MG tablet Take 1 tablet by mouth every 8 (eight) hours as needed for moderate pain. Patient not taking: Reported on 10/29/2015 06/13/15   Ladene Artist, MD  amLODipine ([place]) 10  MG tablet Take 10 mg by mouth daily.    Historical Provider, MD  aspirin 81 MG tablet Take 81 mg by mouth daily.    Historical Provider, MD  atorvastatin (LIPITOR) 20 MG tablet Take 20 mg by mouth daily. 02/07/15   Historical Provider, MD  cloNIDine (CATAPRES) 0.2 MG tablet Take 1 tablet (0.2 mg total) by mouth 3 (three) times daily. 09/09/15   Marijean Heath, NP  hydrALAZINE (APRESOLINE) 50 MG tablet Take 50 mg by mouth 2 (two) times daily. 12/04/14   Historical Provider, MD  labetalol (NORMODYNE) 300 MG tablet Take 2 tablets (600 mg total) by mouth 2 (two) times daily. 12/14/14   Liliane Shi, PA-C  Multiple Vitamins-Minerals (MULTIVITAMIN WITH MINERALS) tablet Take 1 tablet by mouth daily.    Historical Provider, MD  omeprazole (PRILOSEC) 20 MG capsule Take 1 capsule (20 mg total) by mouth daily. 10/29/15   Elnora Morrison, MD    Family History Family History  Problem Relation Age of Onset  . Hypertension    . Colon cancer Paternal Uncle   .  Stroke Maternal Aunt   . Heart attack Brother   . Diabetes Maternal Aunt   . Throat cancer Neg Hx   . Pancreatic cancer Neg Hx   . Esophageal cancer Neg Hx   . Kidney disease Neg Hx   . Liver disease Neg Hx     Social History Social History  Substance Use Topics  . Smoking status: Current Some Day Smoker    Packs/day: 0.25    Years: 23.00    Types: Cigarettes  . Smokeless tobacco: Former Systems developer    Types: Chew     Comment: as of 05-22-14 down to 3-4 cigs daily- 06/14/15- 4 cigs  . Alcohol use No     Comment: + previous use     Allergies   Imdur [isosorbide nitrate] and Tramadol   Review of Systems Review of Systems  Unable to perform ROS: Mental status change     Physical Exam Updated Vital Signs BP 127/83   Pulse 83   Temp 97.7 F (36.5 C) (Axillary)   Resp 20   Wt 70 kg   SpO2 100%   BMI 21.52 kg/m   Physical Exam  Constitutional: He appears listless.  HENT:  Head: Normocephalic and atraumatic.  Eyes: Conjunctivae are normal. Pupils are equal, round, and reactive to light.  Neck: Normal range of motion. Neck supple.  Cardiovascular:  Murmur heard.  Systolic (harsh click) murmur is present  Pulmonary/Chest: Effort normal and breath sounds normal. No respiratory distress.  Abdominal: Soft. He exhibits no distension. There is no tenderness.  Musculoskeletal: He exhibits no edema or deformity.  Neurological: He appears listless.  Flaccid RUE and RLE.   Neurological exam is significantly limited due to the patient's change in mental status.   Skin: Skin is warm and dry. He is not diaphoretic.  Nursing note and vitals reviewed.    ED Treatments / Results  Labs (all labs ordered are listed, but only abnormal results are displayed) Labs Reviewed  CBC - Abnormal; Notable for the following:       Result Value   Platelets 115 (*)    All other components within normal limits  COMPREHENSIVE METABOLIC PANEL - Abnormal; Notable for the following:    Glucose,  Bld 104 (*)    Creatinine, Ser 1.55 (*)    Albumin 3.4 (*)    GFR calc non Af Amer 53 (*)  All other components within normal limits  I-STAT TROPOININ, ED - Abnormal; Notable for the following:    Troponin i, poc 0.15 (*)    All other components within normal limits  CBG MONITORING, ED - Abnormal; Notable for the following:    Glucose-Capillary 113 (*)    All other components within normal limits  I-STAT CHEM 8, ED - Abnormal; Notable for the following:    Creatinine, Ser 1.60 (*)    Glucose, Bld 101 (*)    All other components within normal limits  I-STAT ARTERIAL BLOOD GAS, ED - Abnormal; Notable for the following:    pH, Arterial 7.103 (*)    pCO2 arterial 58.0 (*)    pO2, Arterial 241.0 (*)    Bicarbonate 18.1 (*)    Acid-base deficit 12.0 (*)    All other components within normal limits  PROTIME-INR  APTT  DIFFERENTIAL  BLOOD GAS, ARTERIAL    EKG  EKG Interpretation  Date/Time:  Friday February 28 2016 20:20:50 EST Ventricular Rate:  73 PR Interval:    QRS Duration: 105 QT Interval:  409 QTC Calculation: 451 R Axis:     Text Interpretation:  Sinus rhythm Probable left atrial enlargement LVH with secondary repolarization abnormality When compared with ECG of 11/10/2015, No significant change was found Confirmed by [facility]  MD, DAVID ([id number]) on 02/28/2016 8:24:46 PM       Radiology Ct Angio Head W Or Wo Contrast  Result Date: 02/28/2016 CLINICAL DATA:  Suspected stroke.  Seizure during the examination. EXAM: CT ANGIOGRAPHY HEAD AND NECK TECHNIQUE: Multidetector CT imaging of the head and neck was performed using the standard protocol during bolus administration of intravenous contrast. Multiplanar CT image reconstructions and MIPs were obtained to evaluate the vascular anatomy. Carotid stenosis measurements (when applicable) are obtained utilizing NASCET criteria, using the distal internal carotid diameter as the denominator. CONTRAST:  50 mL Isovue 370 IV COMPARISON:   None. FINDINGS: CTA NECK FINDINGS Aortic arch: There is an aortic arch endograft stent. There is no aneurysm or dissection of the visualized ascending aorta or aortic arch. Proximal subclavian arteries are normal. Right carotid system: The right common carotid origin is widely patent. There is no common carotid or internal carotid artery dissection or aneurysm. No hemodynamically significant stenosis. Left carotid system: The left common carotid origin is widely patent. There is no common carotid or internal carotid artery dissection or aneurysm. No hemodynamically significant stenosis. Vertebral arteries: The vertebral system is left dominant. Both vertebral artery origins are normal. Both vertebral arteries are normal to their confluence with the basilar artery. Skeleton: There is no bony spinal canal stenosis. No lytic or blastic lesions. Other neck: The nasopharynx is clear. The oropharynx and hypopharynx are normal. The epiglottis is normal. The supraglottic larynx, glottis and subglottic larynx are normal. No retropharyngeal collection. The parapharyngeal spaces are preserved. The parotid and submandibular glands are normal. No sialolithiasis or salivary ductal dilatation. The thyroid gland is normal. There is no cervical lymphadenopathy. Upper chest: There is bilateral upper lobe consolidation. Review of the MIP images confirms the above findings CTA HEAD FINDINGS Anterior circulation: --Intracranial internal carotid arteries: Normal. 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 Otherwise, the anterior cerebral arteries are normal. --Middle cerebral arteries: Moderate narrowing of the left M2 segment proximally. Mild irregularity of the Otherwise normal. --Posterior communicating arteries: Absent bilaterally. Posterior circulation: --Posterior cerebral arteries: There is moderate to severe narrowing of the P2 segment of the  right PCA. There is mild  multifocal narrowing of the left PCA. --Superior cerebellar arteries: Normal. --Basilar artery: There is basilar dolichoectasia, with luminal diameter measuring up to 5.5 mm. --Anterior inferior cerebellar arteries: Not clearly visualized, which is not uncommon. --Posterior inferior cerebellar arteries: Normal. Venous sinuses: As permitted by contrast timing, patent. Anatomic variants: None Review of the MIP images confirms the above findings IMPRESSION: 1. No intracranial arterial occlusion. 2. Moderate narrowing of the left MCA M2 segments. 3. Moderate to severe narrowing of the right PCA P2 segment. 4. Basilar artery dolichoectasia. 5. Bilateral pulmonary upper lobe consolidation, likely aspiration or pneumonia. 6. Normal CTA of the neck. 7. Aortic atherosclerosis. Electronically Signed   By: Ulyses Jarred M.D.   On: 02/28/2016 22:19   Ct Angio Neck W Or Wo Contrast  Result Date: 02/28/2016 CLINICAL DATA:  Suspected stroke.  Seizure during the examination. EXAM: CT ANGIOGRAPHY HEAD AND NECK TECHNIQUE: Multidetector CT imaging of the head and neck was performed using the standard protocol during bolus administration of intravenous contrast. Multiplanar CT image reconstructions and MIPs were obtained to evaluate the vascular anatomy. Carotid stenosis measurements (when applicable) are obtained utilizing NASCET criteria, using the distal internal carotid diameter as the denominator. CONTRAST:  50 mL Isovue 370 IV COMPARISON:  None. FINDINGS: CTA NECK FINDINGS Aortic arch: There is an aortic arch endograft stent. There is no aneurysm or dissection of the visualized ascending aorta or aortic arch. Proximal subclavian arteries are normal. Right carotid system: The right common carotid origin is widely patent. There is no common carotid or internal carotid artery dissection or aneurysm. No hemodynamically significant stenosis. Left carotid system: The left common carotid origin is widely patent. There is no common  carotid or internal carotid artery dissection or aneurysm. No hemodynamically significant stenosis. Vertebral arteries: The vertebral system is left dominant. Both vertebral artery origins are normal. Both vertebral arteries are normal to their confluence with the basilar artery. Skeleton: There is no bony spinal canal stenosis. No lytic or blastic lesions. Other neck: The nasopharynx is clear. The oropharynx and hypopharynx are normal. The epiglottis is normal. The supraglottic larynx, glottis and subglottic larynx are normal. No retropharyngeal collection. The parapharyngeal spaces are preserved. The parotid and submandibular glands are normal. No sialolithiasis or salivary ductal dilatation. The thyroid gland is normal. There is no cervical lymphadenopathy. Upper chest: There is bilateral upper lobe consolidation. Review of the MIP images confirms the above findings CTA HEAD FINDINGS Anterior circulation: --Intracranial internal carotid arteries: Normal. 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 Otherwise, the anterior cerebral arteries are normal. --Middle cerebral arteries: Moderate narrowing of the left M2 segment proximally. Mild irregularity of the Otherwise normal. --Posterior communicating arteries: Absent bilaterally. Posterior circulation: --Posterior cerebral arteries: There is moderate to severe narrowing of the P2 segment of the right PCA. There is mild multifocal narrowing of the left PCA. --Superior cerebellar arteries: Normal. --Basilar artery: There is basilar dolichoectasia, with luminal diameter measuring up to 5.5 mm. --Anterior inferior cerebellar arteries: Not clearly visualized, which is not uncommon. --Posterior inferior cerebellar arteries: Normal. Venous sinuses: As permitted by contrast timing, patent. Anatomic variants: None Review of the MIP images confirms the above findings IMPRESSION: 1. No intracranial arterial occlusion. 2.  Moderate narrowing of the left MCA M2 segments. 3. Moderate to severe narrowing of the right PCA P2 segment. 4. Basilar artery dolichoectasia. 5. Bilateral pulmonary upper lobe consolidation, likely aspiration or pneumonia. 6. Normal CTA of  the neck. 7. Aortic atherosclerosis. Electronically Signed   By: Ulyses Jarred M.D.   On: 02/28/2016 22:19   Dg Chest Portable 1 View  Result Date: 02/28/2016 CLINICAL DATA:  Emergent intubation. EXAM: PORTABLE CHEST 1 VIEW COMPARISON:  CT of the chest 10/13/2015 FINDINGS: Endotracheal tube terminates 2.4 cm above the carina. Enteric catheters collimated off the image. Changes from median sternotomy and aortic valve placement are seen. Aortic stent graft is stable radiographically with increased density along the expected course of the distal aorta. The cardiac silhouette is enlarged. Low lung volumes. There is no evidence of focal airspace consolidation, pleural effusion or pneumothorax. Mild haziness of the lungs may represent pulmonary edema. Osseous structures are without acute abnormality. Soft tissues are grossly normal. IMPRESSION: Endotracheal tube terminates 2.4 cm above the carina. Enlargement of the cardiac silhouette. Stable appearance of the aortic stent graft with increased opacity along the lateral border of the descending aorta at the site of known aortic dissection and aneurysmal dilation. This may represent enlargement of the known aortic dissection, however this radiographic appearance may be produced by incomplete inflation of the lungs an AP technique as well. Electronically Signed   By: Fidela Salisbury M.D.   On: 02/28/2016 21:38   Ct Head Code Stroke W/o Cm  Result Date: 02/28/2016 CLINICAL DATA:  Code stroke. Altered mental status. Minimal unresponsiveness. EXAM: CT HEAD WITHOUT CONTRAST TECHNIQUE: Contiguous axial images were obtained from the base of the skull through the vertex without intravenous contrast. COMPARISON:  Head CT 09/27/2014  FINDINGS: Brain: No mass lesion, intraparenchymal hemorrhage or extra-axial collection. No evidence of acute cortical infarct. There is periventricular hypoattenuation compatible with chronic microvascular disease. There are areas in both frontal lobes that appear to worsened slightly from the prior study. Vascular: No hyperdense vessel or unexpected calcification. Skull: Normal visualized skull base, calvarium and extracranial soft tissues. Sinuses/Orbits: No sinus fluid levels or advanced mucosal thickening. No mastoid effusion. Normal orbits. ASPECTS [facility] Stroke Program Early CT Score) - Ganglionic level infarction (caudate, lentiform nuclei, internal capsule, insula, M1-M3 cortex): 7 - Supraganglionic infarction (M4-M6 cortex): 3 Total score (0-10 with 10 being normal): 10 IMPRESSION: 1. No acute intracranial abnormality. Slight worsening of bilateral frontal predominant white matter disease, likely chronic microvascular ischemia. 2. ASPECTS is 10. These results were called by telephone at the time of interpretation on 02/28/2016 at 8:27 pm to Dr. Delora Fuel , who verbally acknowledged these results. Electronically Signed   By: Ulyses Jarred M.D.   On: 02/28/2016 20:27    Procedures Procedure Name: Intubation Date/Time: 02/28/2016 9:27 PM Performed by: Theodosia Quay Pre-anesthesia Checklist: Timeout performed, Emergency Drugs available, Patient identified and Patient being monitored Oxygen Delivery Method: Non-rebreather mask Preoxygenation: Pre-oxygenation with 100% oxygen Intubation Type: Rapid sequence Laryngoscope Size: 4 Tube size: 7.5 mm Number of attempts: 1 Airway Equipment and Method: Rigid stylet and Video-laryngoscopy Placement Confirmation: ETT inserted through vocal cords under direct vision and Breath sounds checked- equal and bilateral Secured at: 26 cm Difficulty Due To: Difficult Airway- due to limited oral opening and Difficult Airway- due to dentition Comments: Patient  had transient episode of bradycardia. CPR and Epinephrine given. Quickly regained pulse.        Procedure:CPR  Date: 02/28/2016 9:27PM Indication: Bradycardia and loss of pulse Comments: Received one dose of epinephrine 1 mg IV. Heart rate quickly returned to 60-70 bmp range. CPR was performed for <1 minute.   Medications Ordered in ED Medications  etomidate (AMIDATE) injection (20 mg Intravenous  Given 02/28/16 2056)  succinylcholine (ANECTINE) injection (70 mg Intravenous Given 02/28/16 2057)  EPINEPHrine (ADRENALIN) 1 MG/10ML injection (1 mg Intravenous Given 02/28/16 2058)  nicardipine (CARDENE) 20mg  in 0.86% saline 225ml IV infusion (0.1 mg/ml) (0 mg/hr Intravenous Stopped 02/28/16 2235)  levETIRAcetam (KEPPRA) 500 mg in sodium chloride 0.9 % 100 mL IVPB (not administered)  midazolam (VERSED) injection 5 mg (5 mg Intravenous Given 02/28/16 2223)  fentaNYL (SUBLIMAZE) injection 100 mcg (100 mcg Intravenous Given 02/28/16 2223)  midazolam (VERSED) 2 MG/2ML injection (not administered)  fentaNYL (SUBLIMAZE) 100 MCG/2ML injection (not administered)  labetalol (NORMODYNE,TRANDATE) 5 MG/ML injection (20 mg  Given 02/28/16 2018)  iopamidol (ISOVUE-370) 76 % injection 50 mL (50 mLs Intravenous Contrast Given 02/28/16 2046)  LORazepam (ATIVAN) 2 MG/ML injection (  Given 02/28/16 2043)  propofol (DIPRIVAN) 1000 MG/100ML infusion (0 mcg/kg/min  70 kg Intravenous Stopped 02/28/16 2222)  levETIRAcetam (KEPPRA) 1,000 mg in sodium chloride 0.9 % 100 mL IVPB (0 mg Intravenous Stopped 02/28/16 2222)     Initial Impression / Assessment and Plan / ED Course  I have reviewed the triage vital signs and the nursing notes.  Pertinent labs & imaging results that were available during my care of the patient were reviewed by me and considered in my medical decision making (see chart for details).  Clinical Course      This patient is a 45 year old male who presents today as he could stroke.  History is significantly limited due to his change in mental status. On review of his vital signs he is very hypertensive. Reviewed the head CT and on independent review and denies any obvious acute intracranial head bleed. Neurology promptly at the bedside and initial consideration for TPA however patient was very hypertensive and was rapidly approaching the end of the window for TPA. Determined we would proceed with CTA of the head and neck. While going to the scanner the patient had a seizure and rapidly deteriorated. At this point I was very concerned about the patient's ability to protect his airway and thus proceeded with intubation. This is documented above. During induction, the patient did have an episode of bradycardia and received a transient round of compressions and a dose of epinephrine 1 mg IV. Heart rate quickly came up and patient regained a pulse. Intubation was otherwise successful.  Patient will be placed in the ICU given his vent requirements. Neurology will continue to manage the patient. Patient was in critical condition at the time of admission to the ICU.  Final Clinical Impressions(s) / ED Diagnoses   Final diagnoses:  CVA (cerebral vascular accident) [facility])    New Prescriptions New Prescriptions   No medications on file     Theodosia Quay, MD 32/12/24 [id number]    Delora Fuel, MD 03/70/48 [id number]

## 2016-02-28 NOTE — ED Notes (Signed)
Dr.Lindzen paged for bed request

## 2016-02-28 NOTE — ED Triage Notes (Signed)
Pt  Brought by EMS code stroke called at 1600 pt unable to move R side arms or legs, pt unable to follow commands

## 2016-02-28 NOTE — Code Documentation (Signed)
Intubation 7.5, 26 cm at lip

## 2016-02-28 NOTE — Code Documentation (Signed)
Compressions started.

## 2016-02-28 NOTE — ED Notes (Signed)
Pt started seizing on CT table, given 1 mg Ativan and placed on non rebreather

## 2016-02-28 NOTE — ED Notes (Signed)
Dr Ancil Linsey at bedside speaking with family.

## 2016-02-28 NOTE — ED Notes (Signed)
Oxygen sats 88% on vent, pt suctioned, RT at bedside

## 2016-02-28 NOTE — Progress Notes (Signed)
STAT EEG Completed; Results Pending   

## 2016-02-28 NOTE — Progress Notes (Signed)
ABG panic values reported to RN

## 2016-02-28 NOTE — ED Notes (Signed)
Per family, pt was c/o of R hand numbness today starting at 1400

## 2016-02-28 NOTE — Consult Note (Signed)
NEURO HOSPITALIST CONSULT NOTE   Requestig physician: Dr. Roxanne Mins  Reason for Consult: Right sided weakness and aphasia  History obtained from:  Chart   HPI:                                                                                                                                          [name] is an 45 y.o. male brought in by EMS for Code Stroke. Per EMS the patient was unable to move right arm or leg and unable to follow commands during their assessment. Symptoms began as right hand numbness starting at 1600 today, with patient telling his girlfrientd that he just did not feel well. Next, she noticed him dropping objects such as his keys from his right hand.   On arrival he exhibited aphasia and right hemiparesis on exam. CT head revealed no acute intracranial abnormality, with slight worsening of bilateral frontal predominant white matter disease relative to the July 2016 study, as well as chronic microvascular ischemia being noted. CTA was ordered to further evaluate. While obtaining IV access for CTA, the patient started seizing on the CT table, which resolved spontaneously after about 2 minutes. Seizure consisted of tonic clonic activity involving the left more than right side, with LOC and frothy secretions from mouth. He was postictal afterwards, unresponsive to commands and with sonorous breathing. He was given 1 mg Ativan and placed on non-rebreather. Frothy secretions continued and he was felt to be an aspiration risk. He was taken back to the trauma bay for intubation.   Following intubation and sedation with Versed, he was taken emergently back to [place] for CTA of head and neck, which revealed no LVO. 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   He was then loaded with IV Keppra 1000 mg and preparations were made to admit him to the ICU.  An EEG has been ordered in addition to scheduled Keppra 500 mg IV q12h.    Past Medical History:  Diagnosis Date  . Adenomatous colon polyp 08/2015  . Anxiety   . Aortic disease ([place])   . Aortic dissection ([facility])    a. admx 04/2014 >> L renal infarct; a/c renal failure >> b.  s/p Bioprosthetic [place] and total arch replacement and staged endovascular repair of descending aortic aneurysm ([facility] - Dr. [name])  . CAD (coronary artery disease)    a. LHC 4/16:  oD1 60%  . Cardiomyopathy ([place])    a. non-ischemic - probably related to untreated HTN and ETOH abuse - Echo 3/13 with EF 35-40% >> b. Echo 4/16: Severe LVH, EF 55-60%, moderate AI, moderate MR, mild LAE, trivial effusion, known type  B dissection with communication between true and false lumens with suprasternal images suggesting dissection plane may propagate to at least left subclavian takeoff, root above aortic valve okay    . Chronic abdominal pain   . Chronic combined systolic and diastolic congestive heart failure ([place])    a. 05/2011: Adm with pulm edema/HTN urgency, EF 35-40% with diffuse hypokinesis and moderate to severe mitral regurgitation. Cardiomyopathy likely due to uncontrolled HTN and ETOH abuse - cath deferred due to renal insufficiency (felt due to uncontrolled HTN). bJodie Echevaria MV 06/2011: EF 37% and no ischemia or infarction. c. EF 45-50% by echo 01/2012.  [name] Chronic sinusitis   . CKD (chronic kidney disease)    a. 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  peak creatinine 3.46 during admx for aortic dissection 2/16.  sees Dr [name]  . Colon polyps 09/11/2015   Descending, sigmoid polyps  . DDD (degenerative disc disease), lumbar   . Descending thoracic aortic aneurysm ([place])   . Dissecting aneurysm of thoracic aorta ([place])   . ETOH abuse    a. Reported to have quit 05/2011.  [name] GERD (gastroesophageal reflux disease)   . Headache(784.0)    "q other day" (08/08/2013)  . Heart murmur   . Hemorrhoid thrombosis   . History of  echocardiogram    Echo 1/17:  Severe LVH, EF 55-60%, no RWMA, Gr 2 DD, AVR ok, mild to mod MR, mild LAE, mild reduced RVSF, mod RAE  . History of medication noncompliance   . HYPERLIPIDEMIA   . Hypertension    a. Hx of HTN urgency secondary to noncompliance. b. urinary metanephrine and catecholeamine levels normal 2013.  c. Renal art [name] 1/16:  No evidence of renal artery stenosis noted bilaterally.  . INGUINAL HERNIA   . Pneumonia ~ 2013  . Renal insufficiency   . Tobacco abuse   . Valvular heart disease    a. Echo 05/2011: moderate to severe eccentric MR and mild to moderate AI with prolapsing left coronary cusp. b. Echo 01/2012: mild-mod AI, mild dilitation of aortic root, mild MR.;  c. Echo 1/16: Severe LVH consistent with hypertrophic cardio myopathy, EF 50%, no RWMA, mod AI, mild MR, mild RAE, dilated Ao root (40 mm);     Past Surgical History:  Procedure Laterality Date  . ANKLE SURGERY Bilateral    Fractures bilaterally  . AORTIC VALVE SURGERY  09/2014  . FOOT FRACTURE SURGERY Bilateral 2004-2010   "got pins in both of them"  . HEMORRHOID SURGERY N/A 06/15/2015   Procedure: HEMORRHOIDECTOMY;  Surgeon: Stark Klein, MD;  Location: [facility];  Service: General;  Laterality: N/A;  . INGUINAL HERNIA REPAIR Right ~ 1996  . LEFT HEART CATHETERIZATION WITH CORONARY ANGIOGRAM N/A 06/21/2014   Procedure: LEFT HEART CATHETERIZATION WITH CORONARY ANGIOGRAM;  Surgeon: Larey Dresser, MD;  Location: [facility] CATH LAB;  Service: Cardiovascular;  Laterality: N/A;    Family History  Problem Relation Age of Onset  . Hypertension    . Colon cancer Paternal Uncle   . Stroke Maternal Aunt   . Heart attack Brother   . Diabetes Maternal Aunt   . Throat cancer Neg Hx   . Pancreatic cancer Neg Hx   . Esophageal cancer Neg Hx   . Kidney disease Neg Hx   . Liver disease Neg Hx    Social History:  reports that he has been smoking Cigarettes.  He has a 5.75 pack-year smoking history. He has quit using smokeless  tobacco. His smokeless tobacco use  included Chew. He reports that he uses drugs, including Marijuana. He reports that he does not drink alcohol.  Allergies  Allergen Reactions  . Imdur [Isosorbide Nitrate] Other (See Comments)    headache  . Tramadol Other (See Comments)    "headaches" per pt    MEDICATIONS:                                                                                                                     No current facility-administered medications on file prior to encounter.    Current Outpatient Prescriptions on File Prior to Encounter  Medication Sig Dispense Refill  . acetaminophen (TYLENOL) 500 MG tablet Take 1,000 mg by mouth 3 (three) times daily.    [name] acetaminophen-codeine (TYLENOL #3) 300-30 MG tablet Take 1 tablet by mouth every 8 (eight) hours as needed for moderate pain. (Patient not taking: Reported on 10/29/2015) 15 tablet 0  . amLODipine (NORVASC) 10 MG tablet Take 10 mg by mouth daily.    [name] aspirin 81 MG tablet Take 81 mg by mouth daily.    [name] atorvastatin (LIPITOR) 20 MG tablet Take 20 mg by mouth daily.    . cloNIDine (CATAPRES) 0.2 MG tablet Take 1 tablet (0.2 mg total) by mouth 3 (three) times daily.    . hydrALAZINE (APRESOLINE) 50 MG tablet Take 50 mg by mouth 2 (two) times daily.    [name] labetalol (NORMODYNE) 300 MG tablet Take 2 tablets (600 mg total) by mouth 2 (two) times daily. 240 tablet 6  . Multiple Vitamins-Minerals (MULTIVITAMIN WITH MINERALS) tablet Take 1 tablet by mouth daily.    [name] omeprazole (PRILOSEC) 20 MG capsule Take 1 capsule (20 mg total) by mouth daily. 30 capsule 0  . [DISCONTINUED] nitroGLYCERIN (NITRODUR - DOSED IN MG/24 HR) 0.4 mg/hr patch Place 1 patch (0.4 mg total) onto the skin daily. 30 patch 1    ROS:                                                                                                                                       Unable to obtain due to AMS.   Blood pressure 127/83, pulse 83, temperature 97.7 F (36.5  C), temperature source Axillary, resp. rate 20, weight 70 kg (154 lb 5.2 oz), SpO2 (!) 88 %.   General Examination:  HEENT-  Normocephalic/atraumatic. ,  Lungs- Respirations unlabored. No gross wheezing.  Extremities- Warm and well perfused.   Neurological Examination (prior to seizure in CT): Mental Status: Awake. Confused, incoherent and mildly agitated. Makes no eye contact or attempt to communicate. Does not follow commands.  Cranial Nerves: II: Blinks to threat bilaterally. PERRL.  III,IV, VI: ptosis not present, exotropia noted. Does not track visually. No nystagmus noted.  V,VII: Spontaneously grimaces and smiles as if to internal stimuli. Facial movements are symmetric. Does not react to tactile stimulation.  VIII: No response to verbal stimuli while in an agitated, awake state.  IX,X: Unable to assess XI: unable to assess shoulder shrug XII: does not follow command for tongue extension Motor/Sensory: RUE without movement to noxious stimuli but with normal tone. Moves LUE in response to noxious stimuli with 4-5/5 strength. RLE withdraws to noxious less than LLE.  Sensory: Pinprick and light touch intact throughout, bilaterally Deep Tendon Reflexes: 4+ left patella (crossed adductor), 3+ right patella, 2+ ankles, 1+ biceps and brachioradialis bilaterally. Toes are equivocal.  Cerebellar/Gait: Unable to assess.    Lab Results: Basic Metabolic Panel:  Recent Labs Lab 02/28/16 2007 02/28/16 2015  NA 140 140  K 3.8 3.9  CL 107 106  CO2 26  --   GLUCOSE 104* 101*  BUN 13 15  CREATININE 1.55* 1.60*  CALCIUM 8.9  --     Liver Function Tests:  Recent Labs Lab 02/28/16 2007  AST 22  ALT 21  ALKPHOS 97  BILITOT 0.8  PROT 6.8  ALBUMIN 3.4*   No results for input(s): LIPASE, AMYLASE in the last 168 hours. No results for input(s): AMMONIA in the last 168  hours.  CBC:  Recent Labs Lab 02/28/16 2007 02/28/16 2015  WBC 6.3  --   NEUTROABS 2.4  --   HGB 13.4 14.3  HCT 40.3 42.0  MCV 92.4  --   PLT 115*  --     Cardiac Enzymes: No results for input(s): CKTOTAL, CKMB, CKMBINDEX, TROPONINI in the last 168 hours.  Lipid Panel: No results for input(s): CHOL, TRIG, HDL, CHOLHDL, VLDL, LDLCALC in the last 168 hours.  CBG:  Recent Labs Lab 02/28/16 2019  GLUCAP 113*    Microbiology: Results for orders placed or performed during the hospital encounter of 09/07/15  MRSA PCR Screening     Status: Abnormal   Collection Time: 09/07/15  7:46 PM  Result Value Ref Range Status   MRSA by PCR POSITIVE (A) NEGATIVE Final    Comment:        The GeneXpert MRSA Assay (FDA approved for NASAL specimens only), is one component of a comprehensive MRSA colonization surveillance program. It is not intended to diagnose MRSA infection nor to guide or monitor treatment for MRSA infections. RESULT CALLED TO, READ BACK BY AND VERIFIED WITH: [facility] RN 2105 09/07/15 MITCHELL,L     Coagulation Studies:  Recent Labs  02/28/16 2007  LABPROT 14.1  INR 1.08    Imaging: Ct Angio Head W Or Wo Contrast  Result Date: 02/28/2016 CLINICAL DATA:  Suspected stroke.  Seizure during the examination. EXAM: CT ANGIOGRAPHY HEAD AND NECK TECHNIQUE: Multidetector CT imaging of the head and neck was performed using the standard protocol during bolus administration of intravenous contrast. Multiplanar CT image reconstructions and MIPs were obtained to evaluate the vascular anatomy. Carotid stenosis measurements (when applicable) are obtained utilizing NASCET criteria, using the distal internal carotid diameter as the denominator. CONTRAST:  50 mL Isovue 370  IV COMPARISON:  None. FINDINGS: CTA NECK FINDINGS Aortic arch: There is an aortic arch endograft stent. There is no aneurysm or dissection of the visualized ascending aorta or aortic arch. Proximal  subclavian arteries are normal. Right carotid system: The right common carotid origin is widely patent. There is no common carotid or internal carotid artery dissection or aneurysm. No hemodynamically significant stenosis. Left carotid system: The left common carotid origin is widely patent. There is no common carotid or internal carotid artery dissection or aneurysm. No hemodynamically significant stenosis. Vertebral arteries: The vertebral system is left dominant. Both vertebral artery origins are normal. Both vertebral arteries are normal to their confluence with the basilar artery. Skeleton: There is no bony spinal canal stenosis. No lytic or blastic lesions. Other neck: The nasopharynx is clear. The oropharynx and hypopharynx are normal. The epiglottis is normal. The supraglottic larynx, glottis and subglottic larynx are normal. No retropharyngeal collection. The parapharyngeal spaces are preserved. The parotid and submandibular glands are normal. No sialolithiasis or salivary ductal dilatation. The thyroid gland is normal. There is no cervical lymphadenopathy. Upper chest: There is bilateral upper lobe consolidation. Review of the MIP images confirms the above findings CTA HEAD FINDINGS Anterior circulation: --Intracranial internal carotid arteries: Normal. 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 Otherwise, the anterior cerebral arteries are normal. --Middle cerebral arteries: Moderate narrowing of the left M2 segment proximally. Mild irregularity of the Otherwise normal. --Posterior communicating arteries: Absent bilaterally. Posterior circulation: --Posterior cerebral arteries: There is moderate to severe narrowing of the P2 segment of the right PCA. There is mild multifocal narrowing of the left PCA. --Superior cerebellar arteries: Normal. --Basilar artery: There is basilar dolichoectasia, with luminal diameter measuring up to 5.5 mm. --Anterior  inferior cerebellar arteries: Not clearly visualized, which is not uncommon. --Posterior inferior cerebellar arteries: Normal. Venous sinuses: As permitted by contrast timing, patent. Anatomic variants: None Review of the MIP images confirms the above findings IMPRESSION: 1. No intracranial arterial occlusion. 2. Moderate narrowing of the left MCA M2 segments. 3. Moderate to severe narrowing of the right PCA P2 segment. 4. Basilar artery dolichoectasia. 5. Bilateral pulmonary upper lobe consolidation, likely aspiration or pneumonia. 6. Normal CTA of the neck. 7. Aortic atherosclerosis. Electronically Signed   By: Ulyses Jarred M.D.   On: 02/28/2016 22:19   Ct Angio Neck W Or Wo Contrast  Result Date: 02/28/2016 CLINICAL DATA:  Suspected stroke.  Seizure during the examination. EXAM: CT ANGIOGRAPHY HEAD AND NECK TECHNIQUE: Multidetector CT imaging of the head and neck was performed using the standard protocol during bolus administration of intravenous contrast. Multiplanar CT image reconstructions and MIPs were obtained to evaluate the vascular anatomy. Carotid stenosis measurements (when applicable) are obtained utilizing NASCET criteria, using the distal internal carotid diameter as the denominator. CONTRAST:  50 mL Isovue 370 IV COMPARISON:  None. FINDINGS: CTA NECK FINDINGS Aortic arch: There is an aortic arch endograft stent. There is no aneurysm or dissection of the visualized ascending aorta or aortic arch. Proximal subclavian arteries are normal. Right carotid system: The right common carotid origin is widely patent. There is no common carotid or internal carotid artery dissection or aneurysm. No hemodynamically significant stenosis. Left carotid system: The left common carotid origin is widely patent. There is no common carotid or internal carotid artery dissection or aneurysm. No hemodynamically significant stenosis. Vertebral arteries: The vertebral system is left dominant. Both vertebral artery  origins are normal. Both vertebral arteries are normal  to their confluence with the basilar artery. Skeleton: There is no bony spinal canal stenosis. No lytic or blastic lesions. Other neck: The nasopharynx is clear. The oropharynx and hypopharynx are normal. The epiglottis is normal. The supraglottic larynx, glottis and subglottic larynx are normal. No retropharyngeal collection. The parapharyngeal spaces are preserved. The parotid and submandibular glands are normal. No sialolithiasis or salivary ductal dilatation. The thyroid gland is normal. There is no cervical lymphadenopathy. Upper chest: There is bilateral upper lobe consolidation. Review of the MIP images confirms the above findings CTA HEAD FINDINGS Anterior circulation: --Intracranial internal carotid arteries: Normal. 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 Otherwise, the anterior cerebral arteries are normal. --Middle cerebral arteries: Moderate narrowing of the left M2 segment proximally. Mild irregularity of the Otherwise normal. --Posterior communicating arteries: Absent bilaterally. Posterior circulation: --Posterior cerebral arteries: There is moderate to severe narrowing of the P2 segment of the right PCA. There is mild multifocal narrowing of the left PCA. --Superior cerebellar arteries: Normal. --Basilar artery: There is basilar dolichoectasia, with luminal diameter measuring up to 5.5 mm. --Anterior inferior cerebellar arteries: Not clearly visualized, which is not uncommon. --Posterior inferior cerebellar arteries: Normal. Venous sinuses: As permitted by contrast timing, patent. Anatomic variants: None Review of the MIP images confirms the above findings IMPRESSION: 1. No intracranial arterial occlusion. 2. Moderate narrowing of the left MCA M2 segments. 3. Moderate to severe narrowing of the right PCA P2 segment. 4. Basilar artery dolichoectasia. 5. Bilateral pulmonary upper lobe  consolidation, likely aspiration or pneumonia. 6. Normal CTA of the neck. 7. Aortic atherosclerosis. Electronically Signed   By: Ulyses Jarred M.D.   On: 02/28/2016 22:19   Dg Chest Portable 1 View  Result Date: 02/28/2016 CLINICAL DATA:  Emergent intubation. EXAM: PORTABLE CHEST 1 VIEW COMPARISON:  CT of the chest 10/13/2015 FINDINGS: Endotracheal tube terminates 2.4 cm above the carina. Enteric catheters collimated off the image. Changes from median sternotomy and aortic valve placement are seen. Aortic stent graft is stable radiographically with increased density along the expected course of the distal aorta. The cardiac silhouette is enlarged. Low lung volumes. There is no evidence of focal airspace consolidation, pleural effusion or pneumothorax. Mild haziness of the lungs may represent pulmonary edema. Osseous structures are without acute abnormality. Soft tissues are grossly normal. IMPRESSION: Endotracheal tube terminates 2.4 cm above the carina. Enlargement of the cardiac silhouette. Stable appearance of the aortic stent graft with increased opacity along the lateral border of the descending aorta at the site of known aortic dissection and aneurysmal dilation. This may represent enlargement of the known aortic dissection, however this radiographic appearance may be produced by incomplete inflation of the lungs an AP technique as well. Electronically Signed   By: Fidela Salisbury M.D.   On: 02/28/2016 21:38   Ct Head Code Stroke W/o Cm  Result Date: 02/28/2016 CLINICAL DATA:  Code stroke. Altered mental status. Minimal unresponsiveness. EXAM: CT HEAD WITHOUT CONTRAST TECHNIQUE: Contiguous axial images were obtained from the base of the skull through the vertex without intravenous contrast. COMPARISON:  Head CT 09/27/2014 FINDINGS: Brain: No mass lesion, intraparenchymal hemorrhage or extra-axial collection. No evidence of acute cortical infarct. There is periventricular hypoattenuation  compatible with chronic microvascular disease. There are areas in both frontal lobes that appear to worsened slightly from the prior study. Vascular: No hyperdense vessel or unexpected calcification. Skull: Normal visualized skull base, calvarium and extracranial soft tissues. Sinuses/Orbits: No sinus fluid levels or  advanced mucosal thickening. No mastoid effusion. Normal orbits. ASPECTS [facility] Stroke Program Early CT Score) - Ganglionic level infarction (caudate, lentiform nuclei, internal capsule, insula, M1-M3 cortex): 7 - Supraganglionic infarction (M4-M6 cortex): 3 Total score (0-10 with 10 being normal): 10 IMPRESSION: 1. No acute intracranial abnormality. Slight worsening of bilateral frontal predominant white matter disease, likely chronic microvascular ischemia. 2. ASPECTS is 10. These results were called by telephone at the time of interpretation on 02/28/2016 at 8:27 pm to Dr. Delora Fuel , who verbally acknowledged these results. Electronically Signed   By: Ulyses Jarred M.D.   On: 02/28/2016 20:27    Assessment: 1. New onset seizure.  2. Initial presentation with acute onset of rapidly worsening right sided weakness and altered mental status. DDx includes acute stroke, however CTA revealed no LVO to correlate with this. Another possibility is incoordination with AMS secondary to intoxication with unknown substance, followed by seizure (would be more likely with sympathomimetic abuse). Also on DDx is subclinical seizure with postictal state, followed by a generalized tonic clonic seizure.  3. EEG reveals no electrographic seizure or interictal spikes based upon my preliminary reading. Benzodiazepine effect is noted. Final report by epileptologist is pending.   Recommendations: 1. Admit to MICU. 2. Has been loaded with Keppra 1000 mg IV.  3. Continue with scheduled Keppra 500 mg IV q12h.  4. Ativan PRN seizure.  5.Tox screen. 6. MRI brain with and without contrast.  7. Continue ASA.  8.  May need echocardiogram to assess for possible endocardial or aortic thrombus formation.   Electronically signed: Dr. Kerney Elbe 02/28/2016, 10:55 PM

## 2016-02-28 NOTE — Progress Notes (Signed)
Cardene drip is necessary to control severely elevated SBP in mid 200's. Has an increased QTc of 451 which increases the risk of complications. In the context of his acute stroke with malignant HTN and lack of sufficient response to labetalol, the benefits of nicardipine drip for acute control of BP outweighs the overall risk.   Also discussed with pharmacist who agrees that administration of nicardipine in this patient is relatively low risk.   Electronically signed: Dr. Kerney Elbe

## 2016-02-28 NOTE — Progress Notes (Signed)
Code Stroke called on 45 y.o male. LSN 1600 per EMS. Arrived to [facility] with right side weakness, aphasia, acute AMS. Pertinent history includes anxiety, CAD, Aortic dissection, CHF, CKD, HTN, Hyperlipidemia. Pt taken to CT scan STAT. CT scan negative per Neurologist Dr. Cheral Marker. Pt nonverbal and not following commands. On exam, he was awake but responds to any question by simply nodding and speaks random words. NIHSS completed yielding 20. See neuro flowsheet for specifics. Once in ED triage room BP elevated labetalol given x 2 with BP still not within range to give TPA within windowed time. Pt taken for STAT CTA. While in CT suite Pt had witnessed tonic clonic seizure lastin about 2 minutes. 1 mg Ativan given just after self resolution of seizure activity per order Dr. Cheral Marker. Pt unable to protect airway for CTA, brought back to trauma room for intubation. Pt had a few minutes of CPR after brady down and pulseless, compressions and EPI x 1 given. Once airway placement confirmed by CXR Pt brought to CT suite for CTA. Cardene gtt started for HTN while in CT. CTA reviewed per Neurologist, awaiting Radiology read. Pt for ICU admit.

## 2016-02-28 NOTE — H&P (Signed)
PULMONARY / CRITICAL CARE MEDICINE   Name: [name] MRN: [id number] DOB: 12-02-1970    ADMISSION DATE:  02/28/2016  CHIEF COMPLAINT:  Confusion, headache, and seizure  HISTORY OF PRESENT ILLNESS:   [name] is a 45 y/o man with a complex cardiovascular history including aortic dissection s/p repair and reconstruction, systolic and diastolic HF, HTN who presented to the ED with confusion, R sided weakness, and changes in mental status as a code stroke. He had a stat head CT and had a witnessed seizure event which resolved spontaneously, although he was treated with ativan. Subsequently, he had depressed mental status and respiratory drive, and was intubated for airway protection. PCCM was called for admission.  PAST MEDICAL HISTORY :  He  has a past medical history of Adenomatous colon polyp (08/2015); Anxiety; Aortic disease ([place]); Aortic dissection ([facility]); CAD (coronary artery disease); Cardiomyopathy ([place]); Chronic abdominal pain; Chronic combined systolic and diastolic congestive heart failure ([place]); Chronic sinusitis; CKD (chronic kidney disease); Colon polyps (09/11/2015); DDD (degenerative disc disease), lumbar; Descending thoracic aortic aneurysm ([place]); Dissecting aneurysm of thoracic aorta ([place]); ETOH abuse; GERD (gastroesophageal reflux disease); Headache(784.0); Heart murmur; Hemorrhoid thrombosis; History of echocardiogram; History of medication noncompliance; HYPERLIPIDEMIA; Hypertension; INGUINAL HERNIA; Pneumonia (~ 2013); Renal insufficiency; Tobacco abuse; and Valvular heart disease.  PAST SURGICAL HISTORY: He  has a past surgical history that includes Ankle surgery (Bilateral); Inguinal hernia repair (Right, ~ 1996); Foot fracture surgery (Bilateral, 2004-2010); left heart catheterization with coronary angiogram (N/A, 06/21/2014); Aortic valve surgery (09/2014); and Hemorrhoid surgery (N/A, 06/15/2015).  Allergies  Allergen Reactions  . Imdur [Isosorbide Nitrate] Other (See  Comments)    headache  . Tramadol Other (See Comments)    "headaches" per pt    No current facility-administered medications on file prior to encounter.    Current Outpatient Prescriptions on File Prior to Encounter  Medication Sig  . acetaminophen (TYLENOL) 500 MG tablet Take 1,000 mg by mouth 3 (three) times daily.  [name] acetaminophen-codeine (TYLENOL #3) 300-30 MG tablet Take 1 tablet by mouth every 8 (eight) hours as needed for moderate pain. (Patient not taking: Reported on 10/29/2015)  . amLODipine (NORVASC) 10 MG tablet Take 10 mg by mouth daily.  [name] aspirin 81 MG tablet Take 81 mg by mouth daily.  [name] atorvastatin (LIPITOR) 20 MG tablet Take 20 mg by mouth daily.  . cloNIDine (CATAPRES) 0.2 MG tablet Take 1 tablet (0.2 mg total) by mouth 3 (three) times daily.  . hydrALAZINE (APRESOLINE) 50 MG tablet Take 50 mg by mouth 2 (two) times daily.  [name] labetalol (NORMODYNE) 300 MG tablet Take 2 tablets (600 mg total) by mouth 2 (two) times daily.  . Multiple Vitamins-Minerals (MULTIVITAMIN WITH MINERALS) tablet Take 1 tablet by mouth daily.  [name] omeprazole (PRILOSEC) 20 MG capsule Take 1 capsule (20 mg total) by mouth daily.  . [DISCONTINUED] nitroGLYCERIN (NITRODUR - DOSED IN MG/24 HR) 0.4 mg/hr patch Place 1 patch (0.4 mg total) onto the skin daily.    FAMILY HISTORY:  Unable to obtain  SOCIAL HISTORY: He  reports that he has been smoking Cigarettes.  He has a 5.75 pack-year smoking history. He has quit using smokeless tobacco. His smokeless tobacco use included Chew. He reports that he uses drugs, including Marijuana. He reports that he does not drink alcohol.  REVIEW OF SYSTEMS:   Unable to obtain 2/2 intubated state.  SUBJECTIVE:  Young adult man intubated and sedated.  VITAL SIGNS: BP (!) 167/103   Pulse 70  Temp 97.7 F (36.5 C) (Axillary)   Resp 22   Wt 154 lb 5.2 oz (70 kg)   SpO2 100%   BMI 21.52 kg/m   HEMODYNAMICS:    VENTILATOR SETTINGS: Vent Mode: PRVC FiO2  (%):  [60 %-100 %] 100 % Set Rate:  [16 bmp-22 bmp] 22 bmp Vt Set:  [620 mL] 620 mL PEEP:  [5 cmH20-10 cmH20] 5 cmH20 Plateau Pressure:  [20 cmH20-27 cmH20] 20 cmH20  INTAKE / OUTPUT: No intake/output data recorded.  PHYSICAL EXAMINATION: General:  Intubated and sedated Neuro:  Sedated, no spontaneous activity HEENT:  ETT in place Cardiovascular:  RRR, normal S1/S2 Lungs:  CTA Abdomen:  Soft, nontender Musculoskeletal:  No deformities Skin:  No rashes on visible skin  LABS:  BMET  Recent Labs Lab 02/28/16 2007 02/28/16 2015  NA 140 140  K 3.8 3.9  CL 107 106  CO2 26  --   BUN 13 15  CREATININE 1.55* 1.60*  GLUCOSE 104* 101*    Electrolytes  Recent Labs Lab 02/28/16 2007  CALCIUM 8.9    CBC  Recent Labs Lab 02/28/16 2007 02/28/16 2015  WBC 6.3  --   HGB 13.4 14.3  HCT 40.3 42.0  PLT 115*  --     Coag's  Recent Labs Lab 02/28/16 2007  APTT 33  INR 1.08    Sepsis Markers No results for input(s): LATICACIDVEN, PROCALCITON, O2SATVEN in the last 168 hours.  ABG  Recent Labs Lab 02/28/16 2148  PHART 7.103*  PCO2ART 58.0*  PO2ART 241.0*    Liver Enzymes  Recent Labs Lab 02/28/16 2007  AST 22  ALT 21  ALKPHOS 97  BILITOT 0.8  ALBUMIN 3.4*    Cardiac Enzymes No results for input(s): TROPONINI, PROBNP in the last 168 hours.  Glucose  Recent Labs Lab 02/28/16 2019  GLUCAP 113*    Imaging Ct Angio Head W Or Wo Contrast  Result Date: 02/28/2016 CLINICAL DATA:  Suspected stroke.  Seizure during the examination. EXAM: CT ANGIOGRAPHY HEAD AND NECK TECHNIQUE: Multidetector CT imaging of the head and neck was performed using the standard protocol during bolus administration of intravenous contrast. Multiplanar CT image reconstructions and MIPs were obtained to evaluate the vascular anatomy. Carotid stenosis measurements (when applicable) are obtained utilizing NASCET criteria, using the distal internal carotid diameter as the  denominator. CONTRAST:  50 mL Isovue 370 IV COMPARISON:  None. FINDINGS: CTA NECK FINDINGS Aortic arch: There is an aortic arch endograft stent. There is no aneurysm or dissection of the visualized ascending aorta or aortic arch. Proximal subclavian arteries are normal. Right carotid system: The right common carotid origin is widely patent. There is no common carotid or internal carotid artery dissection or aneurysm. No hemodynamically significant stenosis. Left carotid system: The left common carotid origin is widely patent. There is no common carotid or internal carotid artery dissection or aneurysm. No hemodynamically significant stenosis. Vertebral arteries: The vertebral system is left dominant. Both vertebral artery origins are normal. Both vertebral arteries are normal to their confluence with the basilar artery. Skeleton: There is no bony spinal canal stenosis. No lytic or blastic lesions. Other neck: The nasopharynx is clear. The oropharynx and hypopharynx are normal. The epiglottis is normal. The supraglottic larynx, glottis and subglottic larynx are normal. No retropharyngeal collection. The parapharyngeal spaces are preserved. The parotid and submandibular glands are normal. No sialolithiasis or salivary ductal dilatation. The thyroid gland is normal. There is no cervical lymphadenopathy. Upper chest: There is bilateral upper  lobe consolidation. Review of the MIP images confirms the above findings CTA HEAD FINDINGS Anterior circulation: --Intracranial internal carotid arteries: Normal. 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 Otherwise, the anterior cerebral arteries are normal. --Middle cerebral arteries: Moderate narrowing of the left M2 segment proximally. Mild irregularity of the Otherwise normal. --Posterior communicating arteries: Absent bilaterally. Posterior circulation: --Posterior cerebral arteries: There is moderate to severe narrowing of  the P2 segment of the right PCA. There is mild multifocal narrowing of the left PCA. --Superior cerebellar arteries: Normal. --Basilar artery: There is basilar dolichoectasia, with luminal diameter measuring up to 5.5 mm. --Anterior inferior cerebellar arteries: Not clearly visualized, which is not uncommon. --Posterior inferior cerebellar arteries: Normal. Venous sinuses: As permitted by contrast timing, patent. Anatomic variants: None Review of the MIP images confirms the above findings IMPRESSION: 1. No intracranial arterial occlusion. 2. Moderate narrowing of the left MCA M2 segments. 3. Moderate to severe narrowing of the right PCA P2 segment. 4. Basilar artery dolichoectasia. 5. Bilateral pulmonary upper lobe consolidation, likely aspiration or pneumonia. 6. Normal CTA of the neck. 7. Aortic atherosclerosis. Electronically Signed   By: Ulyses Jarred M.D.   On: 02/28/2016 22:19   Ct Angio Neck W Or Wo Contrast  Result Date: 02/28/2016 CLINICAL DATA:  Suspected stroke.  Seizure during the examination. EXAM: CT ANGIOGRAPHY HEAD AND NECK TECHNIQUE: Multidetector CT imaging of the head and neck was performed using the standard protocol during bolus administration of intravenous contrast. Multiplanar CT image reconstructions and MIPs were obtained to evaluate the vascular anatomy. Carotid stenosis measurements (when applicable) are obtained utilizing NASCET criteria, using the distal internal carotid diameter as the denominator. CONTRAST:  50 mL Isovue 370 IV COMPARISON:  None. FINDINGS: CTA NECK FINDINGS Aortic arch: There is an aortic arch endograft stent. There is no aneurysm or dissection of the visualized ascending aorta or aortic arch. Proximal subclavian arteries are normal. Right carotid system: The right common carotid origin is widely patent. There is no common carotid or internal carotid artery dissection or aneurysm. No hemodynamically significant stenosis. Left carotid system: The left common  carotid origin is widely patent. There is no common carotid or internal carotid artery dissection or aneurysm. No hemodynamically significant stenosis. Vertebral arteries: The vertebral system is left dominant. Both vertebral artery origins are normal. Both vertebral arteries are normal to their confluence with the basilar artery. Skeleton: There is no bony spinal canal stenosis. No lytic or blastic lesions. Other neck: The nasopharynx is clear. The oropharynx and hypopharynx are normal. The epiglottis is normal. The supraglottic larynx, glottis and subglottic larynx are normal. No retropharyngeal collection. The parapharyngeal spaces are preserved. The parotid and submandibular glands are normal. No sialolithiasis or salivary ductal dilatation. The thyroid gland is normal. There is no cervical lymphadenopathy. Upper chest: There is bilateral upper lobe consolidation. Review of the MIP images confirms the above findings CTA HEAD FINDINGS Anterior circulation: --Intracranial internal carotid arteries: Normal. 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 Otherwise, the anterior cerebral arteries are normal. --Middle cerebral arteries: Moderate narrowing of the left M2 segment proximally. Mild irregularity of the Otherwise normal. --Posterior communicating arteries: Absent bilaterally. Posterior circulation: --Posterior cerebral arteries: There is moderate to severe narrowing of the P2 segment of the right PCA. There is mild multifocal narrowing of the left PCA. --Superior cerebellar arteries: Normal. --Basilar artery: There is basilar dolichoectasia, with luminal diameter measuring up to 5.5 mm. --  Anterior inferior cerebellar arteries: Not clearly visualized, which is not uncommon. --Posterior inferior cerebellar arteries: Normal. Venous sinuses: As permitted by contrast timing, patent. Anatomic variants: None Review of the MIP images confirms the above findings  IMPRESSION: 1. No intracranial arterial occlusion. 2. Moderate narrowing of the left MCA M2 segments. 3. Moderate to severe narrowing of the right PCA P2 segment. 4. Basilar artery dolichoectasia. 5. Bilateral pulmonary upper lobe consolidation, likely aspiration or pneumonia. 6. Normal CTA of the neck. 7. Aortic atherosclerosis. Electronically Signed   By: Ulyses Jarred M.D.   On: 02/28/2016 22:19   Dg Chest Portable 1 View  Result Date: 02/28/2016 CLINICAL DATA:  Emergent intubation. EXAM: PORTABLE CHEST 1 VIEW COMPARISON:  CT of the chest 10/13/2015 FINDINGS: Endotracheal tube terminates 2.4 cm above the carina. Enteric catheters collimated off the image. Changes from median sternotomy and aortic valve placement are seen. Aortic stent graft is stable radiographically with increased density along the expected course of the distal aorta. The cardiac silhouette is enlarged. Low lung volumes. There is no evidence of focal airspace consolidation, pleural effusion or pneumothorax. Mild haziness of the lungs may represent pulmonary edema. Osseous structures are without acute abnormality. Soft tissues are grossly normal. IMPRESSION: Endotracheal tube terminates 2.4 cm above the carina. Enlargement of the cardiac silhouette. Stable appearance of the aortic stent graft with increased opacity along the lateral border of the descending aorta at the site of known aortic dissection and aneurysmal dilation. This may represent enlargement of the known aortic dissection, however this radiographic appearance may be produced by incomplete inflation of the lungs an AP technique as well. Electronically Signed   By: Fidela Salisbury M.D.   On: 02/28/2016 21:38   Ct Head Code Stroke W/o Cm  Result Date: 02/28/2016 CLINICAL DATA:  Code stroke. Altered mental status. Minimal unresponsiveness. EXAM: CT HEAD WITHOUT CONTRAST TECHNIQUE: Contiguous axial images were obtained from the base of the skull through the vertex without  intravenous contrast. COMPARISON:  Head CT 09/27/2014 FINDINGS: Brain: No mass lesion, intraparenchymal hemorrhage or extra-axial collection. No evidence of acute cortical infarct. There is periventricular hypoattenuation compatible with chronic microvascular disease. There are areas in both frontal lobes that appear to worsened slightly from the prior study. Vascular: No hyperdense vessel or unexpected calcification. Skull: Normal visualized skull base, calvarium and extracranial soft tissues. Sinuses/Orbits: No sinus fluid levels or advanced mucosal thickening. No mastoid effusion. Normal orbits. ASPECTS [facility] Stroke Program Early CT Score) - Ganglionic level infarction (caudate, lentiform nuclei, internal capsule, insula, M1-M3 cortex): 7 - Supraganglionic infarction (M4-M6 cortex): 3 Total score (0-10 with 10 being normal): 10 IMPRESSION: 1. No acute intracranial abnormality. Slight worsening of bilateral frontal predominant white matter disease, likely chronic microvascular ischemia. 2. ASPECTS is 10. These results were called by telephone at the time of interpretation on 02/28/2016 at 8:27 pm to Dr. Delora Fuel , who verbally acknowledged these results. Electronically Signed   By: Ulyses Jarred M.D.   On: 02/28/2016 20:27     STUDIES:  CT Head/neck >> no acute stroke seen EEG > (read pending, but prelim is no sz)  CULTURES: None  ANTIBIOTICS: None  SIGNIFICANT EVENTS: Intubated in ED, c/b bradycardic event  LINES/TUBES: ETT PIV  DISCUSSION: 44 y/o man presenting with severe hypertension, AMS, seizure.  ASSESSMENT / PLAN:  PULMONARY A: Need for mechanical ventilation P:   Full vent support SBT in AM  CARDIOVASCULAR A:  Hx of aortic dissection s/p repair Mixed Cardiomyopathy, systolic / diastolic  HF Hypertensive Emergency P:  Continue home meds Tx HTN with Cardene, will also be lowered w/ propofol/sedation Consider chest CT to ensure no change in grafts  RENAL A:    Hx of renal insuffiencey  P:   Will monitor  GASTROINTESTINAL A:   No active issues P:    HEMATOLOGIC A:   No active issues P:   INFECTIOUS A:   No active issues P:    ENDOCRINE A:   No active issues   P:    NEUROLOGIC A:   Hypertensive Emergency (Possible PRESS) Seizure Possible stroke P:   Appreciate neuro input, agree w/ MRI Will keep intubated tonight, will plan for SBT in AM w/ possible extubation  RASS goal: 0  FAMILY  - Updates:   - Inter-disciplinary family meet or Palliative Care meeting due by:  12/22   CRITICAL CARE Performed by: Luz Brazen   Total critical care time: 60 minutes  Critical care time was exclusive of separately billable procedures and treating other patients.  Critical care was necessary to treat or prevent imminent or life-threatening deterioration.  Critical care was time spent personally by me on the following activities: development of treatment plan with patient and/or surrogate as well as nursing, discussions with consultants, evaluation of patient's response to treatment, examination of patient, obtaining history from patient or surrogate, ordering and performing treatments and interventions, ordering and review of laboratory studies, ordering and review of radiographic studies, pulse oximetry and re-evaluation of patient's condition.  Luz Brazen, MD Pulmonary and [place] Pager: [phone]  02/28/2016, 11:30 PM

## 2016-02-29 ENCOUNTER — Inpatient Hospital Stay (HOSPITAL_COMMUNITY): Payer: Medicare Other

## 2016-02-29 DIAGNOSIS — E440 Moderate protein-calorie malnutrition: Secondary | ICD-10-CM | POA: Insufficient documentation

## 2016-02-29 DIAGNOSIS — R569 Unspecified convulsions: Secondary | ICD-10-CM

## 2016-02-29 DIAGNOSIS — J988 Other specified respiratory disorders: Secondary | ICD-10-CM

## 2016-02-29 LAB — POCT I-STAT 3, ART BLOOD GAS (G3+)
Acid-base deficit: 1 mmol/L (ref 0.0–2.0)
Bicarbonate: 23 mmol/L (ref 20.0–28.0)
O2 Saturation: 98 %
Patient temperature: 39
TCO2: 24 mmol/L (ref 0–100)
pCO2 arterial: 38.4 mmHg (ref 32.0–48.0)
pH, Arterial: 7.393 (ref 7.350–7.450)
pO2, Arterial: 110 mmHg — ABNORMAL HIGH (ref 83.0–108.0)

## 2016-02-29 LAB — PHOSPHORUS
PHOSPHORUS: 3.3 mg/dL (ref 2.5–4.6)
Phosphorus: 2.9 mg/dL (ref 2.5–4.6)
Phosphorus: 4.1 mg/dL (ref 2.5–4.6)

## 2016-02-29 LAB — MAGNESIUM
MAGNESIUM: 2 mg/dL (ref 1.7–2.4)
Magnesium: 2.1 mg/dL (ref 1.7–2.4)
Magnesium: 2 mg/dL (ref 1.7–2.4)

## 2016-02-29 LAB — URINALYSIS, ROUTINE W REFLEX MICROSCOPIC
BILIRUBIN URINE: NEGATIVE
Bacteria, UA: NONE SEEN
Glucose, UA: NEGATIVE mg/dL
Ketones, ur: NEGATIVE mg/dL
Leukocytes, UA: NEGATIVE
NITRITE: NEGATIVE
PH: 7 (ref 5.0–8.0)
Protein, ur: 100 mg/dL — AB
SPECIFIC GRAVITY, URINE: 1.016 (ref 1.005–1.030)

## 2016-02-29 LAB — BASIC METABOLIC PANEL
ANION GAP: 11 (ref 5–15)
BUN: 13 mg/dL (ref 6–20)
CALCIUM: 8.7 mg/dL — AB (ref 8.9–10.3)
CO2: 19 mmol/L — ABNORMAL LOW (ref 22–32)
Chloride: 108 mmol/L (ref 101–111)
Creatinine, Ser: 1.56 mg/dL — ABNORMAL HIGH (ref 0.61–1.24)
GFR, EST NON AFRICAN AMERICAN: 53 mL/min — AB (ref >60)
Glucose, Bld: 92 mg/dL (ref 65–99)
POTASSIUM: 3.6 mmol/L (ref 3.5–5.1)
SODIUM: 138 mmol/L (ref 135–145)

## 2016-02-29 LAB — CBC
HCT: 43.3 % (ref 39.0–52.0)
Hemoglobin: 14.3 g/dL (ref 13.0–17.0)
MCH: 30.5 pg (ref 26.0–34.0)
MCHC: 33 g/dL (ref 30.0–36.0)
MCV: 92.3 fL (ref 78.0–100.0)
PLATELETS: 97 10*3/uL — AB (ref 150–400)
RBC: 4.69 MIL/uL (ref 4.22–5.81)
RDW: 14.5 % (ref 11.5–15.5)
WBC: 7.3 10*3/uL (ref 4.0–10.5)

## 2016-02-29 LAB — PROCALCITONIN: PROCALCITONIN: 0.47 ng/mL

## 2016-02-29 LAB — GLUCOSE, CAPILLARY
GLUCOSE-CAPILLARY: 96 mg/dL (ref 65–99)
GLUCOSE-CAPILLARY: 94 mg/dL (ref 65–99)
Glucose-Capillary: 94 mg/dL (ref 65–99)
Glucose-Capillary: 99 mg/dL (ref 65–99)

## 2016-02-29 LAB — RAPID URINE DRUG SCREEN, HOSP PERFORMED
Amphetamines: NOT DETECTED
Barbiturates: NOT DETECTED
Benzodiazepines: POSITIVE — AB
Cocaine: NOT DETECTED
Opiates: NOT DETECTED
Tetrahydrocannabinol: POSITIVE — AB

## 2016-02-29 LAB — TRIGLYCERIDES: Triglycerides: 111 mg/dL (ref <150)

## 2016-02-29 LAB — MRSA PCR SCREENING: MRSA by PCR: NEGATIVE

## 2016-02-29 LAB — LACTIC ACID, PLASMA
Lactic Acid, Venous: 2.6 mmol/L (ref 0.5–1.9)
Lactic Acid, Venous: 2.2 mmol/L (ref 0.5–1.9)

## 2016-02-29 MED ORDER — VITAL AF 1.2 CAL PO LIQD
1000.0000 mL | ORAL | Status: DC
  Administered 2016-02-29 – 2016-03-01 (×2): 1000 mL

## 2016-02-29 MED ORDER — PROPOFOL 1000 MG/100ML IV EMUL
5.0000 ug/kg/min | INTRAVENOUS | Status: DC
  Administered 2016-02-29: 55 ug/kg/min via INTRAVENOUS
  Administered 2016-02-29 (×2): 45 ug/kg/min via INTRAVENOUS
  Administered 2016-02-29: 50 ug/kg/min via INTRAVENOUS
  Administered 2016-02-29: 55 ug/kg/min via INTRAVENOUS
  Administered 2016-03-01: 30 ug/kg/min via INTRAVENOUS
  Administered 2016-03-01: 35 ug/kg/min via INTRAVENOUS
  Filled 2016-02-29 (×7): qty 100

## 2016-02-29 MED ORDER — ACETAMINOPHEN 650 MG RE SUPP
650.0000 mg | Freq: Once | RECTAL | Status: AC
  Administered 2016-02-29: 650 mg via RECTAL
  Filled 2016-02-29: qty 1

## 2016-02-29 MED ORDER — VANCOMYCIN HCL IN DEXTROSE 1-5 GM/200ML-% IV SOLN
1000.0000 mg | Freq: Two times a day (BID) | INTRAVENOUS | Status: DC
  Administered 2016-02-29 – 2016-03-04 (×9): 1000 mg via INTRAVENOUS
  Filled 2016-02-29 (×13): qty 200

## 2016-02-29 MED ORDER — PIPERACILLIN-TAZOBACTAM 3.375 G IVPB
3.3750 g | Freq: Three times a day (TID) | INTRAVENOUS | Status: DC
  Administered 2016-02-29 – 2016-03-05 (×14): 3.375 g via INTRAVENOUS
  Filled 2016-02-29 (×19): qty 50

## 2016-02-29 MED ORDER — SODIUM CHLORIDE 0.9 % IV BOLUS (SEPSIS)
750.0000 mL | Freq: Once | INTRAVENOUS | Status: AC
  Administered 2016-02-29: 750 mL via INTRAVENOUS

## 2016-02-29 MED ORDER — PIPERACILLIN-TAZOBACTAM 3.375 G IVPB 30 MIN
3.3750 g | INTRAVENOUS | Status: AC
  Administered 2016-02-29: 3.375 g via INTRAVENOUS
  Filled 2016-02-29: qty 50

## 2016-02-29 MED ORDER — FAMOTIDINE 40 MG/5ML PO SUSR
20.0000 mg | Freq: Every day | ORAL | Status: DC
  Administered 2016-02-29 (×2): 20 mg
  Filled 2016-02-29 (×2): qty 2.5

## 2016-02-29 MED ORDER — ACETAMINOPHEN 160 MG/5ML PO SOLN
650.0000 mg | Freq: Four times a day (QID) | ORAL | Status: DC | PRN
  Administered 2016-02-29: 650 mg
  Filled 2016-02-29: qty 20.3

## 2016-02-29 MED ORDER — ORAL CARE MOUTH RINSE
15.0000 mL | OROMUCOSAL | Status: DC
  Administered 2016-02-29 – 2016-03-01 (×13): 15 mL via OROMUCOSAL

## 2016-02-29 MED ORDER — HYDRALAZINE HCL 50 MG PO TABS
50.0000 mg | ORAL_TABLET | Freq: Two times a day (BID) | ORAL | Status: DC
  Administered 2016-02-29 – 2016-03-01 (×3): 50 mg
  Filled 2016-02-29 (×3): qty 1

## 2016-02-29 MED ORDER — AMLODIPINE BESYLATE 10 MG PO TABS
10.0000 mg | ORAL_TABLET | Freq: Every day | ORAL | Status: DC
  Administered 2016-02-29 – 2016-03-01 (×2): 10 mg
  Filled 2016-02-29 (×2): qty 1

## 2016-02-29 MED ORDER — VITAL HIGH PROTEIN PO LIQD: 1000.0000 mL | ORAL | Status: DC

## 2016-02-29 MED ORDER — PRO-STAT SUGAR FREE PO LIQD: 30.0000 mL | Freq: Two times a day (BID) | ORAL | Status: DC

## 2016-02-29 MED ORDER — ACETAMINOPHEN 325 MG PO TABS: 650.0000 mg | ORAL_TABLET | Freq: Once | ORAL | Status: DC

## 2016-02-29 MED ORDER — CHLORHEXIDINE GLUCONATE 0.12% ORAL RINSE (MEDLINE KIT)
15.0000 mL | Freq: Two times a day (BID) | OROMUCOSAL | Status: DC
  Administered 2016-02-29 – 2016-03-01 (×3): 15 mL via OROMUCOSAL

## 2016-02-29 MED ORDER — SODIUM CHLORIDE 0.9 % IV BOLUS (SEPSIS)
2000.0000 mL | Freq: Once | INTRAVENOUS | Status: AC
  Administered 2016-02-29: 2000 mL via INTRAVENOUS

## 2016-02-29 MED ORDER — CLONIDINE HCL 0.1 MG PO TABS
0.2000 mg | ORAL_TABLET | Freq: Three times a day (TID) | ORAL | Status: DC
  Administered 2016-02-29 – 2016-03-01 (×4): 0.2 mg
  Filled 2016-02-29 (×4): qty 2

## 2016-02-29 MED FILL — Medication: Qty: 1 | Status: AC

## 2016-02-29 NOTE — Progress Notes (Signed)
eLink Physician-Brief Progress Note Patient Name: [name] DOB: 11/29/1970 MRN: [id number]   Date of Service  02/29/2016  HPI/Events of Note  oliguria  eICU Interventions  Gave 750cc bolus     Intervention Category Intermediate Interventions: Oliguria - evaluation and management  Asencion Noble 02/29/2016, 10:50 PM

## 2016-02-29 NOTE — Progress Notes (Signed)
Initial Nutrition Assessment  DOCUMENTATION CODES:  Non-severe (moderate) malnutrition in context of chronic illness  Pt meets criteria for MODERATE MALNUTRITION in the context of Chronic Illness as evidenced by Mild-Mod fat/muscle wasting.  INTERVENTION:  Initiate TF via NGTwith Vital AF 1.2 at goal rate of 55 ml/h (1320 ml per day)  to provide 1584 kcals (+610 from sedative), 99 gm protein, 1071 ml free water daily.  NUTRITION DIAGNOSIS:  Inadequate oral intake related to inability to eat as evidenced by NPO status.  GOAL:  Patient will meet greater than or equal to 90% of their needs  MONITOR:  Diet advancement, Vent status, Labs, I & O's  REASON FOR ASSESSMENT:  Consult Enteral/tube feeding initiation and management  ASSESSMENT:  45 y/o male PMHx Aortic dissection/reconstruction, HF, HTN, Anxiety, CAD, ETOH/Tobacco abuse, HLD, GERD. Presented to ED with confusion and R hemiplegia. Code stroke. Had witnessed seizure. Intubated for airway protection. RD consulted for TF management.   Family at bedside. They report no recent wt changes and pt's UBW is 150-160 lbs.   Abdomen: soft  Pt displays mild-mod wasting of fat/muscle  Propofol infusing  Patient is currently intubated on ventilator support MV: 11 L/min Temp (24hrs), Avg:100.1 F (37.8 C), Min:97.7 F (36.5 C), Max:102.7 F (39.3 C)  Propofol:  23.1 ml/hr = 610 kcal/day  Labs: TG 111 Medications: Propofol   Recent Labs Lab 02/28/16 2007 02/28/16 2015 02/29/16 0236  NA 140 140 138  K 3.8 3.9 3.6  CL 107 106 108  CO2 26  --  19*  BUN 13 15 13   CREATININE 1.55* 1.60* 1.56*  CALCIUM 8.9  --  8.7*  MG  --   --  2.0  PHOS  --   --  3.3  GLUCOSE 104* 101* 92   Diet Order:  Diet NPO time specified  Skin:  Reviewed, no issues  Last BM:  Unknown  Height:  Ht Readings from Last 1 Encounters:  02/29/16 5\' 9"  (1.753 m)    Weight:  Wt Readings from Last 1 Encounters:  02/29/16 156 lb 15.5 oz (71.2  kg)   Wt Readings from Last 10 Encounters:  02/29/16 156 lb 15.5 oz (71.2 kg)  11/10/15 155 lb (70.3 kg)  10/29/15 156 lb (70.8 kg)  10/23/15 151 lb 6 oz (68.7 kg)  09/11/15 152 lb (68.9 kg)  09/08/15 151 lb 7.3 oz (68.7 kg)  08/28/15 152 lb (68.9 kg)  08/22/15 148 lb (67.1 kg)  06/14/15 148 lb (67.1 kg)  06/03/15 157 lb (71.2 kg)  Admit at 154   Ideal Body Weight:  72.73 kg  BMI:  Body mass index is 23.18 kg/m.  Estimated Nutritional Needs:  Kcal:  2220 Kcal Protein:  98-112 g (1.4-1.6 g/kg bw) Fluid:  2.1-2.4 L (30-35 ml/kg bw)  EDUCATION NEEDS:  No education needs identified at this time  Burtis Junes RD, LDN, [facility] Pager: [phone] 02/29/2016 11:16 AM

## 2016-02-29 NOTE — Code Documentation (Signed)
Pulses present and bounding

## 2016-02-29 NOTE — Procedures (Signed)
EEG report.  Brief clinical history: 45 years old male with new onset seizures and right sided weakness. Technique: this is a 17 channel routine scalp EEG performed at the bedside with bipolar and monopolar montages arranged in accordance to the international 10/20 system of electrode placement. One channel was dedicated to EKG recording.  Patient is sedated and intubated on the vent. No activating procedures performed.  Description: as the study begins and throughout the entire recording, there is evidence of rather continuous and diffuse alpha frequencies that don't have a predominant location. No focal or generalized epileptiform discharges noted.  No electrographic seizures. EKG showed sinus rhythm.  Impression: this EEG exhibits a pattern consistent with normal sedation. No electrographic seizures noted. Please, be aware that a normal EEG does not exclude the possibility of epilepsy.  Clinical correlation is advised.   Dorian Pod, MD

## 2016-02-29 NOTE — Progress Notes (Signed)
CRITICAL VALUE ALERT  Critical value received:  Lactic acid 2.6   Date of notification:  02/29/2016  Time of notification:  0605  Critical value read back:Yes.    Nurse who received alert:  Jacelyn Grip, RN  MD notified (1st page):  Dr. Chase Caller  Time of first page:  0630  MD notified (2nd page):  Time of second page:  Responding MD: Dr. Chase Caller  Time MD responded:  [place]  MD aware. New orders received.

## 2016-02-29 NOTE — Progress Notes (Signed)
    Febrile  Plan tylneol x 1 Pan culture empriic vanc and zosyn  Allergies  Allergen Reactions  . Imdur [Isosorbide Nitrate] Other (See Comments)    headache  . Tramadol Other (See Comments)    "headaches" per pt    Dr. Brand Males, M.D., [facility].C.P Pulmonary and Critical Care Medicine Staff Physician [place] [facility] Pager: [phone], If no answer or between  15:00h - 7:00h: call [phone]  02/29/2016 3:55 AM  '

## 2016-02-29 NOTE — Progress Notes (Signed)
Pharmacy Antibiotic Note  [name] is a 45 y.o. male admitted on 02/28/2016 with seizure.  Pharmacy has been consulted for Vancomycin and Zosyn dosing for sepsis. Pt now with Tm 102.6.   Plan: Zosyn 3.375gm IV now over 30 min then 3.375gm IV q8h - subsequent doses over 4 hours Vancomycin 1gm IV q12h Will f/u micro data, renal function, and pt's clinical condition Vanc trough prn   Height: 5\' 9"  (175.3 cm) Weight: 156 lb 15.5 oz (71.2 kg) IBW/kg (Calculated) : 70.7  Temp (24hrs), Avg:100.3 F (37.9 C), Min:97.7 F (36.5 C), Max:102.6 F (39.2 C)   Recent Labs Lab 02/28/16 2007 02/28/16 2015 02/29/16 0236  WBC 6.3  --  7.3  CREATININE 1.55* 1.60* 1.56*    Estimated Creatinine Clearance: 61.1 mL/min (by C-G formula based on SCr of 1.56 mg/dL (H)).    Allergies  Allergen Reactions  . Imdur [Isosorbide Nitrate] Other (See Comments)    headache  . Tramadol Other (See Comments)    "headaches" per pt    Antimicrobials this admission: 12/16 Vanc >>  12/16 Zosyn >>   Dose adjustments this admission: n/a  Microbiology results:  BCx:   UCx:    Trach asp:    MRSA PCR:   Thank you for allowing pharmacy to be a part of this patient's care.  Sherlon Handing, PharmD, BCPS Clinical pharmacist, pager [phone] 02/29/2016 3:55 AM

## 2016-02-29 NOTE — Progress Notes (Signed)
    Recent Labs Lab 02/29/16 0442 02/29/16 0448  LATICACIDVEN  --  2.6*  PROCALCITON 0.47  --     Lactate elevated  Plan - 2L fluid bolus and reasess lacate  Dr. Brand Males, M.D., [facility].C.P Pulmonary and Critical Care Medicine Staff Physician [facility] Pager: [phone], If no answer or between  15:00h - 7:00h: call [phone]  02/29/2016 6:36 AM

## 2016-02-29 NOTE — ED Notes (Signed)
Foley placed right as pt was taken upstairs and UA was not sent. On arrival to [place] on floor aware that UA was to be sent. I attempted to send UA prior to returning to ED and RN on 19M stated she would sent it .

## 2016-02-29 NOTE — Progress Notes (Signed)
CRITICAL VALUE ALERT  Critical value received: Lactic Acid 2.2  Date of notification:  02/29/16  Time of notification:  1207  Critical value read back:Yes.    Nurse who received alert:  Roxan Diesel, RN  MD notified (1st page):  Dot Lanes, NP  Time of first page:  1208  MD notified (2nd page):  Time of second page:  Responding MD:  [id number]  Time MD responded:  Noted

## 2016-02-29 NOTE — Progress Notes (Signed)
PCCM Progress Note Admission date: 02/28/2016 Referring provider: Dr. Roxanne Mins, ER  CC: Weakness  Subjective: Remains on full vent support  Vital signs: BP (!) 168/96   Pulse 77   Temp 98.8 F (37.1 C)   Resp (!) 22   Ht 5\' 9"  (1.753 m)   Wt 156 lb 15.5 oz (71.2 kg)   SpO2 99%   BMI 23.18 kg/m   Intake/output: I/O last 3 completed shifts: In: 2715 [I.V.:515; IV Piggyback:2200] Out: 1225 [KDXIP:3825; Emesis/NG output:50]  General: sedated Neuro: RASS -3 HEENT: ETT in place Cardiac: regular, 3/6 SM Chest: no wheeze Abd: soft, non tender Ext: no edema Skin: no rashes  CMP Latest Ref Rng & Units 02/29/2016 02/28/2016 02/28/2016  Glucose 65 - 99 mg/dL 92 101(H) 104(H)  BUN 6 - 20 mg/dL 13 15 13   Creatinine 0.61 - 1.24 mg/dL 1.56(H) 1.60(H) 1.55(H)  Sodium 135 - 145 mmol/L 138 140 140  Potassium 3.5 - 5.1 mmol/L 3.6 3.9 3.8  Chloride 101 - 111 mmol/L 108 106 107  CO2 22 - 32 mmol/L 19(L) - 26  Calcium 8.9 - 10.3 mg/dL 8.7(L) - 8.9  Total Protein 6.5 - 8.1 g/dL - - 6.8  Total Bilirubin 0.3 - 1.2 mg/dL - - 0.8  Alkaline Phos 38 - 126 U/L - - 97  AST 15 - 41 U/L - - 22  ALT 17 - 63 U/L - - 21    CBC Latest Ref Rng & Units 02/29/2016 02/28/2016 02/28/2016  WBC 4.0 - 10.5 K/uL 7.3 - 6.3  Hemoglobin 13.0 - 17.0 g/dL 14.3 14.3 13.4  Hematocrit 39.0 - 52.0 % 43.3 42.0 40.3  Platelets 150 - 400 K/uL 97(L) - 115(L)    ABG    Component Value Date/Time   PHART 7.393 02/29/2016 0416   PCO2ART 38.4 02/29/2016 0416   PO2ART 110.0 (H) 02/29/2016 0416   HCO3 23.0 02/29/2016 0416   TCO2 24 02/29/2016 0416   ACIDBASEDEF 1.0 02/29/2016 0416   O2SAT 98.0 02/29/2016 0416    CBG (last 3)   Recent Labs  02/28/16 2019  GLUCAP 113*    Imaging: Ct Angio Head W Or Wo Contrast  Result Date: 02/28/2016 CLINICAL DATA:  Suspected stroke.  Seizure during the examination. EXAM: CT ANGIOGRAPHY HEAD AND NECK TECHNIQUE: Multidetector CT imaging of the head and neck was performed  using the standard protocol during bolus administration of intravenous contrast. Multiplanar CT image reconstructions and MIPs were obtained to evaluate the vascular anatomy. Carotid stenosis measurements (when applicable) are obtained utilizing NASCET criteria, using the distal internal carotid diameter as the denominator. CONTRAST:  50 mL Isovue 370 IV COMPARISON:  None. FINDINGS: CTA NECK FINDINGS Aortic arch: There is an aortic arch endograft stent. There is no aneurysm or dissection of the visualized ascending aorta or aortic arch. Proximal subclavian arteries are normal. Right carotid system: The right common carotid origin is widely patent. There is no common carotid or internal carotid artery dissection or aneurysm. No hemodynamically significant stenosis. Left carotid system: The left common carotid origin is widely patent. There is no common carotid or internal carotid artery dissection or aneurysm. No hemodynamically significant stenosis. Vertebral arteries: The vertebral system is left dominant. Both vertebral artery origins are normal. Both vertebral arteries are normal to their confluence with the basilar artery. Skeleton: There is no bony spinal canal stenosis. No lytic or blastic lesions. Other neck: The nasopharynx is clear. The oropharynx and hypopharynx are normal. The epiglottis is normal. The supraglottic larynx, glottis  and subglottic larynx are normal. No retropharyngeal collection. The parapharyngeal spaces are preserved. The parotid and submandibular glands are normal. No sialolithiasis or salivary ductal dilatation. The thyroid gland is normal. There is no cervical lymphadenopathy. Upper chest: There is bilateral upper lobe consolidation. Review of the MIP images confirms the above findings CTA HEAD FINDINGS Anterior circulation: --Intracranial internal carotid arteries: Normal. 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 Otherwise, the  anterior cerebral arteries are normal. --Middle cerebral arteries: Moderate narrowing of the left M2 segment proximally. Mild irregularity of the Otherwise normal. --Posterior communicating arteries: Absent bilaterally. Posterior circulation: --Posterior cerebral arteries: There is moderate to severe narrowing of the P2 segment of the right PCA. There is mild multifocal narrowing of the left PCA. --Superior cerebellar arteries: Normal. --Basilar artery: There is basilar dolichoectasia, with luminal diameter measuring up to 5.5 mm. --Anterior inferior cerebellar arteries: Not clearly visualized, which is not uncommon. --Posterior inferior cerebellar arteries: Normal. Venous sinuses: As permitted by contrast timing, patent. Anatomic variants: None Review of the MIP images confirms the above findings IMPRESSION: 1. No intracranial arterial occlusion. 2. Moderate narrowing of the left MCA M2 segments. 3. Moderate to severe narrowing of the right PCA P2 segment. 4. Basilar artery dolichoectasia. 5. Bilateral pulmonary upper lobe consolidation, likely aspiration or pneumonia. 6. Normal CTA of the neck. 7. Aortic atherosclerosis. Electronically Signed   By: Ulyses Jarred M.D.   On: 02/28/2016 22:19   Ct Angio Neck W Or Wo Contrast  Result Date: 02/28/2016 CLINICAL DATA:  Suspected stroke.  Seizure during the examination. EXAM: CT ANGIOGRAPHY HEAD AND NECK TECHNIQUE: Multidetector CT imaging of the head and neck was performed using the standard protocol during bolus administration of intravenous contrast. Multiplanar CT image reconstructions and MIPs were obtained to evaluate the vascular anatomy. Carotid stenosis measurements (when applicable) are obtained utilizing NASCET criteria, using the distal internal carotid diameter as the denominator. CONTRAST:  50 mL Isovue 370 IV COMPARISON:  None. FINDINGS: CTA NECK FINDINGS Aortic arch: There is an aortic arch endograft stent. There is no aneurysm or dissection of the  visualized ascending aorta or aortic arch. Proximal subclavian arteries are normal. Right carotid system: The right common carotid origin is widely patent. There is no common carotid or internal carotid artery dissection or aneurysm. No hemodynamically significant stenosis. Left carotid system: The left common carotid origin is widely patent. There is no common carotid or internal carotid artery dissection or aneurysm. No hemodynamically significant stenosis. Vertebral arteries: The vertebral system is left dominant. Both vertebral artery origins are normal. Both vertebral arteries are normal to their confluence with the basilar artery. Skeleton: There is no bony spinal canal stenosis. No lytic or blastic lesions. Other neck: The nasopharynx is clear. The oropharynx and hypopharynx are normal. The epiglottis is normal. The supraglottic larynx, glottis and subglottic larynx are normal. No retropharyngeal collection. The parapharyngeal spaces are preserved. The parotid and submandibular glands are normal. No sialolithiasis or salivary ductal dilatation. The thyroid gland is normal. There is no cervical lymphadenopathy. Upper chest: There is bilateral upper lobe consolidation. Review of the MIP images confirms the above findings CTA HEAD FINDINGS Anterior circulation: --Intracranial internal carotid arteries: Normal. 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 Otherwise, the anterior cerebral arteries are normal. --Middle cerebral arteries: Moderate narrowing of the left M2 segment proximally. Mild irregularity of the Otherwise normal. --Posterior communicating arteries: Absent bilaterally. Posterior circulation: --Posterior cerebral  arteries: There is moderate to severe narrowing of the P2 segment of the right PCA. There is mild multifocal narrowing of the left PCA. --Superior cerebellar arteries: Normal. --Basilar artery: There is basilar dolichoectasia, with  luminal diameter measuring up to 5.5 mm. --Anterior inferior cerebellar arteries: Not clearly visualized, which is not uncommon. --Posterior inferior cerebellar arteries: Normal. Venous sinuses: As permitted by contrast timing, patent. Anatomic variants: None Review of the MIP images confirms the above findings IMPRESSION: 1. No intracranial arterial occlusion. 2. Moderate narrowing of the left MCA M2 segments. 3. Moderate to severe narrowing of the right PCA P2 segment. 4. Basilar artery dolichoectasia. 5. Bilateral pulmonary upper lobe consolidation, likely aspiration or pneumonia. 6. Normal CTA of the neck. 7. Aortic atherosclerosis. Electronically Signed   By: Ulyses Jarred M.D.   On: 02/28/2016 22:19   Dg Chest Portable 1 View  Result Date: 02/28/2016 CLINICAL DATA:  Emergent intubation. EXAM: PORTABLE CHEST 1 VIEW COMPARISON:  CT of the chest 10/13/2015 FINDINGS: Endotracheal tube terminates 2.4 cm above the carina. Enteric catheters collimated off the image. Changes from median sternotomy and aortic valve placement are seen. Aortic stent graft is stable radiographically with increased density along the expected course of the distal aorta. The cardiac silhouette is enlarged. Low lung volumes. There is no evidence of focal airspace consolidation, pleural effusion or pneumothorax. Mild haziness of the lungs may represent pulmonary edema. Osseous structures are without acute abnormality. Soft tissues are grossly normal. IMPRESSION: Endotracheal tube terminates 2.4 cm above the carina. Enlargement of the cardiac silhouette. Stable appearance of the aortic stent graft with increased opacity along the lateral border of the descending aorta at the site of known aortic dissection and aneurysmal dilation. This may represent enlargement of the known aortic dissection, however this radiographic appearance may be produced by incomplete inflation of the lungs an AP technique as well. Electronically Signed   By:  Fidela Salisbury M.D.   On: 02/28/2016 21:38   Dg Abd Portable 1v  Result Date: 02/29/2016 CLINICAL DATA:  OG tube placement EXAM: PORTABLE ABDOMEN - 1 VIEW COMPARISON:  None. FINDINGS: OG tube coils in the fundus of the stomach. Aortic stents noted. Nonobstructive bowel gas pattern. IMPRESSION: OG tube coils in the fundus of the stomach. Electronically Signed   By: Rolm Baptise M.D.   On: 02/29/2016 07:33   Ct Head Code Stroke W/o Cm  Result Date: 02/28/2016 CLINICAL DATA:  Code stroke. Altered mental status. Minimal unresponsiveness. EXAM: CT HEAD WITHOUT CONTRAST TECHNIQUE: Contiguous axial images were obtained from the base of the skull through the vertex without intravenous contrast. COMPARISON:  Head CT 09/27/2014 FINDINGS: Brain: No mass lesion, intraparenchymal hemorrhage or extra-axial collection. No evidence of acute cortical infarct. There is periventricular hypoattenuation compatible with chronic microvascular disease. There are areas in both frontal lobes that appear to worsened slightly from the prior study. Vascular: No hyperdense vessel or unexpected calcification. Skull: Normal visualized skull base, calvarium and extracranial soft tissues. Sinuses/Orbits: No sinus fluid levels or advanced mucosal thickening. No mastoid effusion. Normal orbits. ASPECTS [facility] Stroke Program Early CT Score) - Ganglionic level infarction (caudate, lentiform nuclei, internal capsule, insula, M1-M3 cortex): 7 - Supraganglionic infarction (M4-M6 cortex): 3 Total score (0-10 with 10 being normal): 10 IMPRESSION: 1. No acute intracranial abnormality. Slight worsening of bilateral frontal predominant white matter disease, likely chronic microvascular ischemia. 2. ASPECTS is 10. These results were called by telephone at the time of interpretation on 02/28/2016 at 8:27 pm to Dr. Shanon Brow  [facility] , who verbally acknowledged these results. Electronically Signed   By: Ulyses Jarred M.D.   On: 02/28/2016 20:27     Studies: CT head, neck 12/15 >> no CVA  Antibiotics: Vancomycin 12/16 >> Zosyn 12/16 >>  Cultures: Blood 12/16 >> Sputum 12/16 >> Urine 12/16 >>  Lines/tubes: ETT 12/15 >>  Events: 12/15 Admit, neuro consulted 12/16 Fever >> add Abx  Summary: 44 yo male smoker with Rt arm/leg weakness, aphasia and altered mental status.  Developed seizure.  Intubated for airway protection.  PMhx of CAD, Aortic dissection, non ischemic CM, HLD, HTN, ETOH, CKD, GERD  Assessment/plan:  Acute encephalopathy. Seizure. - AEDs per neurology  Compromised airway. - full vent support until neuro status more stable  HTN emergency. Hx of CAD, combined chronic CHF. - check UDS - cardene gtt to keep SBP < 160 - norvasc, catapres, hydralazine - f/u Echo  Fever, elevated lactic acid 12/16. - day 1 of abx  CKD 3. - monitor renal fx  DVT prophylaxis - SQ heparin SUP - pepcid Nutrition - tube feeds Goals of care - full code  Updated pt's wife at bedside  CC time 55 minutes  Chesley Mires, MD [place] 02/29/2016, 10:07 AM Pager:  [phone] After 3pm call: [phone]

## 2016-02-29 NOTE — Code Documentation (Signed)
Pt has bounding pulses

## 2016-03-01 ENCOUNTER — Inpatient Hospital Stay (HOSPITAL_COMMUNITY): Payer: Medicare Other

## 2016-03-01 LAB — PROCALCITONIN: PROCALCITONIN: 3.88 ng/mL

## 2016-03-01 LAB — GLUCOSE, CAPILLARY
GLUCOSE-CAPILLARY: 110 mg/dL — AB (ref 65–99)
GLUCOSE-CAPILLARY: 116 mg/dL — AB (ref 65–99)
Glucose-Capillary: 92 mg/dL (ref 65–99)

## 2016-03-01 LAB — URINE CULTURE: Culture: NO GROWTH

## 2016-03-01 LAB — BASIC METABOLIC PANEL
ANION GAP: 5 (ref 5–15)
BUN: 20 mg/dL (ref 6–20)
CALCIUM: 7.9 mg/dL — AB (ref 8.9–10.3)
CO2: 22 mmol/L (ref 22–32)
CREATININE: 2.06 mg/dL — AB (ref 0.61–1.24)
Chloride: 112 mmol/L — ABNORMAL HIGH (ref 101–111)
GFR, EST AFRICAN AMERICAN: 44 mL/min — AB (ref >60)
GFR, EST NON AFRICAN AMERICAN: 38 mL/min — AB (ref >60)
Glucose, Bld: 99 mg/dL (ref 65–99)
Potassium: 3.9 mmol/L (ref 3.5–5.1)
Sodium: 139 mmol/L (ref 135–145)

## 2016-03-01 LAB — ECHOCARDIOGRAM COMPLETE
HEIGHTINCHES: 69 in
Weight: 2589.08 oz

## 2016-03-01 LAB — CBC
HCT: 35.8 % — ABNORMAL LOW (ref 39.0–52.0)
HEMOGLOBIN: 11.6 g/dL — AB (ref 13.0–17.0)
MCH: 30.7 pg (ref 26.0–34.0)
MCHC: 32.4 g/dL (ref 30.0–36.0)
MCV: 94.7 fL (ref 78.0–100.0)
PLATELETS: 77 10*3/uL — AB (ref 150–400)
RBC: 3.78 MIL/uL — AB (ref 4.22–5.81)
RDW: 15.3 % (ref 11.5–15.5)
WBC: 11.2 10*3/uL — ABNORMAL HIGH (ref 4.0–10.5)

## 2016-03-01 LAB — MAGNESIUM: MAGNESIUM: 2.2 mg/dL (ref 1.7–2.4)

## 2016-03-01 LAB — PHOSPHORUS: PHOSPHORUS: 3.3 mg/dL (ref 2.5–4.6)

## 2016-03-01 MED ORDER — ACETAMINOPHEN 325 MG PO TABS
650.0000 mg | ORAL_TABLET | Freq: Four times a day (QID) | ORAL | Status: DC | PRN
  Administered 2016-03-01 – 2016-03-04 (×6): 650 mg via ORAL
  Filled 2016-03-01 (×7): qty 2

## 2016-03-01 MED ORDER — CLONIDINE HCL 0.2 MG PO TABS
0.2000 mg | ORAL_TABLET | Freq: Three times a day (TID) | ORAL | Status: DC
Start: 2016-03-01 — End: 2016-03-06
  Administered 2016-03-01 – 2016-03-06 (×14): 0.2 mg via ORAL
  Filled 2016-03-01 (×7): qty 1
  Filled 2016-03-01: qty 2
  Filled 2016-03-01 (×2): qty 1
  Filled 2016-03-01: qty 2
  Filled 2016-03-01 (×2): qty 1
  Filled 2016-03-01: qty 2
  Filled 2016-03-01: qty 1

## 2016-03-01 MED ORDER — FAMOTIDINE 20 MG PO TABS
20.0000 mg | ORAL_TABLET | Freq: Every day | ORAL | Status: DC
  Administered 2016-03-01 – 2016-03-05 (×5): 20 mg via ORAL
  Filled 2016-03-01 (×5): qty 1

## 2016-03-01 MED ORDER — SODIUM CHLORIDE 0.9 % IV SOLN
250.0000 mL | INTRAVENOUS | Status: DC
  Administered 2016-03-05: 250 mL via INTRAVENOUS

## 2016-03-01 MED ORDER — AMLODIPINE BESYLATE 10 MG PO TABS
10.0000 mg | ORAL_TABLET | Freq: Every day | ORAL | Status: DC
  Administered 2016-03-02 – 2016-03-06 (×5): 10 mg via ORAL
  Filled 2016-03-01 (×5): qty 1

## 2016-03-01 MED ORDER — ACETAMINOPHEN 650 MG RE SUPP: 650.0000 mg | RECTAL | Status: DC | PRN

## 2016-03-01 MED ORDER — HYDRALAZINE HCL 50 MG PO TABS
50.0000 mg | ORAL_TABLET | Freq: Two times a day (BID) | ORAL | Status: DC
Start: 2016-03-01 — End: 2016-03-06
  Administered 2016-03-01 – 2016-03-06 (×10): 50 mg via ORAL
  Filled 2016-03-01 (×10): qty 1

## 2016-03-01 NOTE — Progress Notes (Signed)
NEURO HOSPITALIST PROGRESS NOTE   SUBJECTIVE:                                                                                                                        Extubated, alert and awake, says that " I am lost, there is something wrong with my body, I have many problems". Able to recognize family members at the bedside but then claims that he can not see my fingers during VF testing. No further seizures noted.  On keppra 500 mg BID Has a pneumonia, started on antibiotics 12/16 CT head unremarkable.  CTA revealed no LVO  EEG consistent with sedation but without epileptiform discharges. UDS upon arrival to the ED was + for tetrahydrocannabinol.  Today labs significant for Cr 2.06, wbc 11.2   OBJECTIVE:                                                                                                                           Vital signs in last 24 hours: Temp:  [98.8 F (37.1 C)-100.6 F (38.1 C)] 100 F (37.8 C) (12/17 1130) Pulse Rate:  [63-148] 148 (12/17 1130) Resp:  [16-29] 27 (12/17 1130) BP: (98-171)/(56-111) 149/81 (12/17 1130) SpO2:  [90 %-100 %] 90 % (12/17 1130) FiO2 (%):  [40 %] 40 % (12/17 1000) Weight:  [73.4 kg (161 lb 13.1 oz)] 73.4 kg (161 lb 13.1 oz) (12/17 0630)  Intake/Output from previous day: 12/16 0701 - 12/17 0700 In: 2982.5 [I.V.:440.6; NG/GT:386.8; IV Piggyback:1405] Out: 2725 [Urine:2725] Intake/Output this shift: Total I/O In: 1461.1 [I.V.:421.5; NG/GT:829.6; IV Piggyback:210] Out: 340 [Urine:340] Nutritional status: Diet NPO time specified  Past Medical History:  Diagnosis Date  . Adenomatous colon polyp 08/2015  . Anxiety   . Aortic disease ([place])   . Aortic dissection ([facility])    a. admx 04/2014 >> L renal infarct; a/c renal failure >> b.  s/p Bioprosthetic [place] and total arch replacement and staged endovascular repair of descending aortic aneurysm ([facility] - Dr. [name])  . CAD (coronary artery disease)     a. LHC 4/16:  oD1 60%  . Cardiomyopathy ([place])    a. non-ischemic - probably related to untreated HTN and ETOH abuse - Echo 3/13 with EF 35-40% >> b.  Echo 4/16: Severe LVH, EF 55-60%, moderate AI, moderate MR, mild LAE, trivial effusion, known type B dissection with communication between true and false lumens with suprasternal images suggesting dissection plane may propagate to at least left subclavian takeoff, root above aortic valve okay    . Chronic abdominal pain   . Chronic combined systolic and diastolic congestive heart failure ([place])    a. 05/2011: Adm with pulm edema/HTN urgency, EF 35-40% with diffuse hypokinesis and moderate to severe mitral regurgitation. Cardiomyopathy likely due to uncontrolled HTN and ETOH abuse - cath deferred due to renal insufficiency (felt due to uncontrolled HTN). bJodie Echevaria MV 06/2011: EF 37% and no ischemia or infarction. c. EF 45-50% by echo 01/2012.  [name] Chronic sinusitis   . CKD (chronic kidney disease)    a. 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  peak creatinine 3.46 during admx for aortic dissection 2/16.  sees Dr [name]  . Colon polyps 09/11/2015   Descending, sigmoid polyps  . DDD (degenerative disc disease), lumbar   . Descending thoracic aortic aneurysm ([place])   . Dissecting aneurysm of thoracic aorta ([place])   . ETOH abuse    a. Reported to have quit 05/2011.  [name] GERD (gastroesophageal reflux disease)   . Headache(784.0)    "q other day" (08/08/2013)  . Heart murmur   . Hemorrhoid thrombosis   . History of echocardiogram    Echo 1/17:  Severe LVH, EF 55-60%, no RWMA, Gr 2 DD, AVR ok, mild to mod MR, mild LAE, mild reduced RVSF, mod RAE  . History of medication noncompliance   . HYPERLIPIDEMIA   . Hypertension    a. Hx of HTN urgency secondary to noncompliance. b. urinary metanephrine and catecholeamine levels normal 2013.  c. Renal art [name] 1/16:  No evidence of renal artery stenosis noted bilaterally.  . INGUINAL HERNIA   . Pneumonia ~ 2013  . Renal  insufficiency   . Tobacco abuse   . Valvular heart disease    a. Echo 05/2011: moderate to severe eccentric MR and mild to moderate AI with prolapsing left coronary cusp. b. Echo 01/2012: mild-mod AI, mild dilitation of aortic root, mild MR.;  c. Echo 1/16: Severe LVH consistent with hypertrophic cardio myopathy, EF 50%, no RWMA, mod AI, mild MR, mild RAE, dilated Ao root (40 mm);     Neurologic ROS negative with exception of above.   Neurologic Exam:  General: NAD Mental Status: Alert, disoriented to year-month, conversation makes no sense.  Speech fluent without evidence of aphasia.  Able to follow 3 step commands without difficulty. Cranial Nerves: II:  Visual fields grossly normal, pupils equal, round, reactive to light and accommodation III,IV, VI: ptosis not present, extra-ocular motions intact bilaterally V,VII: smile symmetric, facial light touch sensation normal bilaterally VIII: hearing normal bilaterally IX,X: uvula rises symmetrically XI: bilateral shoulder shrug XII: midline tongue extension without atrophy or fasciculations  Motor: Moves all limbs symmetrically Tone and bulk:normal tone throughout; no atrophy noted Sensory: Pinprick and light touch intact throughout, bilaterally Deep Tendon Reflexes:  Right: Upper Extremity   Left: Upper extremity   biceps (C-5 to C-6) 2/4   biceps (C-5 to C-6) 2/4 tricep (C7) 2/4    triceps (C7) 2/4 Brachioradialis (C6) 2/4  Brachioradialis (C6) 2/4  Lower Extremity Lower Extremity  quadriceps (L-2 to L-4) 2/4   quadriceps (L-2 to L-4) 2/4 Achilles (S1) 2/4   Achilles (S1) 2/4  Plantars: Right: downgoing   Left: downgoing Cerebellar: normal finger-to-nose,  normal heel-to-shin test  Gait:  Unable to test due to multiple leads    Lab Results: Lab Results  Component Value Date/Time   CHOL 182 03/27/2014 11:16 PM   Lipid Panel  Recent Labs  02/29/16 0236  TRIG 111    Studies/Results: Ct Angio Head W Or Wo  Contrast  Result Date: 02/28/2016 CLINICAL DATA:  Suspected stroke.  Seizure during the examination. EXAM: CT ANGIOGRAPHY HEAD AND NECK TECHNIQUE: Multidetector CT imaging of the head and neck was performed using the standard protocol during bolus administration of intravenous contrast. Multiplanar CT image reconstructions and MIPs were obtained to evaluate the vascular anatomy. Carotid stenosis measurements (when applicable) are obtained utilizing NASCET criteria, using the distal internal carotid diameter as the denominator. CONTRAST:  50 mL Isovue 370 IV COMPARISON:  None. FINDINGS: CTA NECK FINDINGS Aortic arch: There is an aortic arch endograft stent. There is no aneurysm or dissection of the visualized ascending aorta or aortic arch. Proximal subclavian arteries are normal. Right carotid system: The right common carotid origin is widely patent. There is no common carotid or internal carotid artery dissection or aneurysm. No hemodynamically significant stenosis. Left carotid system: The left common carotid origin is widely patent. There is no common carotid or internal carotid artery dissection or aneurysm. No hemodynamically significant stenosis. Vertebral arteries: The vertebral system is left dominant. Both vertebral artery origins are normal. Both vertebral arteries are normal to their confluence with the basilar artery. Skeleton: There is no bony spinal canal stenosis. No lytic or blastic lesions. Other neck: The nasopharynx is clear. The oropharynx and hypopharynx are normal. The epiglottis is normal. The supraglottic larynx, glottis and subglottic larynx are normal. No retropharyngeal collection. The parapharyngeal spaces are preserved. The parotid and submandibular glands are normal. No sialolithiasis or salivary ductal dilatation. The thyroid gland is normal. There is no cervical lymphadenopathy. Upper chest: There is bilateral upper lobe consolidation. Review of the MIP images confirms the above  findings CTA HEAD FINDINGS Anterior circulation: --Intracranial internal carotid arteries: Normal. 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 Otherwise, the anterior cerebral arteries are normal. --Middle cerebral arteries: Moderate narrowing of the left M2 segment proximally. Mild irregularity of the Otherwise normal. --Posterior communicating arteries: Absent bilaterally. Posterior circulation: --Posterior cerebral arteries: There is moderate to severe narrowing of the P2 segment of the right PCA. There is mild multifocal narrowing of the left PCA. --Superior cerebellar arteries: Normal. --Basilar artery: There is basilar dolichoectasia, with luminal diameter measuring up to 5.5 mm. --Anterior inferior cerebellar arteries: Not clearly visualized, which is not uncommon. --Posterior inferior cerebellar arteries: Normal. Venous sinuses: As permitted by contrast timing, patent. Anatomic variants: None Review of the MIP images confirms the above findings IMPRESSION: 1. No intracranial arterial occlusion. 2. Moderate narrowing of the left MCA M2 segments. 3. Moderate to severe narrowing of the right PCA P2 segment. 4. Basilar artery dolichoectasia. 5. Bilateral pulmonary upper lobe consolidation, likely aspiration or pneumonia. 6. Normal CTA of the neck. 7. Aortic atherosclerosis. Electronically Signed   By: Ulyses Jarred M.D.   On: 02/28/2016 22:19   Ct Angio Neck W Or Wo Contrast  Result Date: 02/28/2016 CLINICAL DATA:  Suspected stroke.  Seizure during the examination. EXAM: CT ANGIOGRAPHY HEAD AND NECK TECHNIQUE: Multidetector CT imaging of the head and neck was performed using the standard protocol during bolus administration of intravenous contrast. Multiplanar CT image reconstructions and MIPs were obtained to evaluate the vascular anatomy. Carotid stenosis measurements (when applicable)  are obtained utilizing NASCET criteria, using the distal internal  carotid diameter as the denominator. CONTRAST:  50 mL Isovue 370 IV COMPARISON:  None. FINDINGS: CTA NECK FINDINGS Aortic arch: There is an aortic arch endograft stent. There is no aneurysm or dissection of the visualized ascending aorta or aortic arch. Proximal subclavian arteries are normal. Right carotid system: The right common carotid origin is widely patent. There is no common carotid or internal carotid artery dissection or aneurysm. No hemodynamically significant stenosis. Left carotid system: The left common carotid origin is widely patent. There is no common carotid or internal carotid artery dissection or aneurysm. No hemodynamically significant stenosis. Vertebral arteries: The vertebral system is left dominant. Both vertebral artery origins are normal. Both vertebral arteries are normal to their confluence with the basilar artery. Skeleton: There is no bony spinal canal stenosis. No lytic or blastic lesions. Other neck: The nasopharynx is clear. The oropharynx and hypopharynx are normal. The epiglottis is normal. The supraglottic larynx, glottis and subglottic larynx are normal. No retropharyngeal collection. The parapharyngeal spaces are preserved. The parotid and submandibular glands are normal. No sialolithiasis or salivary ductal dilatation. The thyroid gland is normal. There is no cervical lymphadenopathy. Upper chest: There is bilateral upper lobe consolidation. Review of the MIP images confirms the above findings CTA HEAD FINDINGS Anterior circulation: --Intracranial internal carotid arteries: Normal. 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 Otherwise, the anterior cerebral arteries are normal. --Middle cerebral arteries: Moderate narrowing of the left M2 segment proximally. Mild irregularity of the Otherwise normal. --Posterior communicating arteries: Absent bilaterally. Posterior circulation: --Posterior cerebral arteries: There is  moderate to severe narrowing of the P2 segment of the right PCA. There is mild multifocal narrowing of the left PCA. --Superior cerebellar arteries: Normal. --Basilar artery: There is basilar dolichoectasia, with luminal diameter measuring up to 5.5 mm. --Anterior inferior cerebellar arteries: Not clearly visualized, which is not uncommon. --Posterior inferior cerebellar arteries: Normal. Venous sinuses: As permitted by contrast timing, patent. Anatomic variants: None Review of the MIP images confirms the above findings IMPRESSION: 1. No intracranial arterial occlusion. 2. Moderate narrowing of the left MCA M2 segments. 3. Moderate to severe narrowing of the right PCA P2 segment. 4. Basilar artery dolichoectasia. 5. Bilateral pulmonary upper lobe consolidation, likely aspiration or pneumonia. 6. Normal CTA of the neck. 7. Aortic atherosclerosis. Electronically Signed   By: Ulyses Jarred M.D.   On: 02/28/2016 22:19   Dg Chest Port 1 View  Result Date: 03/01/2016 CLINICAL DATA:  Respiratory failure. EXAM: PORTABLE CHEST 1 VIEW COMPARISON:  02/28/2016. FINDINGS: Stable enlarged cardiac silhouette, median sternotomy wires, prosthetic aortic valve and aortic stent graft. Nasogastric tube tip in the proximal stomach. Endotracheal tube in satisfactory position. Increased patchy opacity at the left lung base and interval mild patchy opacity at the right lung base. Unremarkable bones. IMPRESSION: 1. Increased left basilar atelectasis, aspiration pneumonitis or pneumonia. 2. Interval mild right basilar atelectasis, pneumonia or aspiration pneumonitis. Electronically Signed   By: Claudie Revering M.D.   On: 03/01/2016 07:20   Dg Chest Portable 1 View  Result Date: 02/28/2016 CLINICAL DATA:  Emergent intubation. EXAM: PORTABLE CHEST 1 VIEW COMPARISON:  CT of the chest 10/13/2015 FINDINGS: Endotracheal tube terminates 2.4 cm above the carina. Enteric catheters collimated off the image. Changes from median sternotomy and  aortic valve placement are seen. Aortic stent graft is stable radiographically with increased density along the expected course of the distal aorta. The cardiac  silhouette is enlarged. Low lung volumes. There is no evidence of focal airspace consolidation, pleural effusion or pneumothorax. Mild haziness of the lungs may represent pulmonary edema. Osseous structures are without acute abnormality. Soft tissues are grossly normal. IMPRESSION: Endotracheal tube terminates 2.4 cm above the carina. Enlargement of the cardiac silhouette. Stable appearance of the aortic stent graft with increased opacity along the lateral border of the descending aorta at the site of known aortic dissection and aneurysmal dilation. This may represent enlargement of the known aortic dissection, however this radiographic appearance may be produced by incomplete inflation of the lungs an AP technique as well. Electronically Signed   By: Fidela Salisbury M.D.   On: 02/28/2016 21:38   Dg Abd Portable 1v  Result Date: 02/29/2016 CLINICAL DATA:  OG tube placement EXAM: PORTABLE ABDOMEN - 1 VIEW COMPARISON:  None. FINDINGS: OG tube coils in the fundus of the stomach. Aortic stents noted. Nonobstructive bowel gas pattern. IMPRESSION: OG tube coils in the fundus of the stomach. Electronically Signed   By: Rolm Baptise M.D.   On: 02/29/2016 07:33   Ct Head Code Stroke W/o Cm  Result Date: 02/28/2016 CLINICAL DATA:  Code stroke. Altered mental status. Minimal unresponsiveness. EXAM: CT HEAD WITHOUT CONTRAST TECHNIQUE: Contiguous axial images were obtained from the base of the skull through the vertex without intravenous contrast. COMPARISON:  Head CT 09/27/2014 FINDINGS: Brain: No mass lesion, intraparenchymal hemorrhage or extra-axial collection. No evidence of acute cortical infarct. There is periventricular hypoattenuation compatible with chronic microvascular disease. There are areas in both frontal lobes that appear to worsened  slightly from the prior study. Vascular: No hyperdense vessel or unexpected calcification. Skull: Normal visualized skull base, calvarium and extracranial soft tissues. Sinuses/Orbits: No sinus fluid levels or advanced mucosal thickening. No mastoid effusion. Normal orbits. ASPECTS [facility] Stroke Program Early CT Score) - Ganglionic level infarction (caudate, lentiform nuclei, internal capsule, insula, M1-M3 cortex): 7 - Supraganglionic infarction (M4-M6 cortex): 3 Total score (0-10 with 10 being normal): 10 IMPRESSION: 1. No acute intracranial abnormality. Slight worsening of bilateral frontal predominant white matter disease, likely chronic microvascular ischemia. 2. ASPECTS is 10. These results were called by telephone at the time of interpretation on 02/28/2016 at 8:27 pm to Dr. Delora Fuel , who verbally acknowledged these results. Electronically Signed   By: Ulyses Jarred M.D.   On: 02/28/2016 20:27    MEDICATIONS                                                                                                                       I have reviewed the patient's current medications.  ASSESSMENT/PLAN:  45 y/o with new onset isolated witnessed seizure. Looks confused today, making no sense during our conversation. EEG without epileptiform discharges. MRI brain recommended upon initial evaluation, pending. May no need long term AED unless MRI demonstrates abnormality with an epileptogenic potential. Pneumonia tx as per ICU attending. Will follow up after MRI.  Dorian Pod, MD [facility] [phone]  03/01/2016, 11:49 AM

## 2016-03-01 NOTE — Progress Notes (Signed)
PCCM Progress Note Admission date: 02/28/2016 Referring provider: Dr. Roxanne Mins, ER  CC: Weakness  Subjective: Tolerating SBT.  Vital signs: BP 136/76   Pulse 87   Temp 100 F (37.8 C)   Resp (!) 23   Ht 5\' 9"  (1.753 m)   Wt 161 lb 13.1 oz (73.4 kg)   SpO2 98%   BMI 23.90 kg/m   Intake/output: I/O last 3 completed shifts: In: 5697.5 [I.V.:955.6; Other:750; NG/GT:386.8; IV Piggyback:3605] Out: 3950 [Urine:3900; Emesis/NG output:50]  General: alert Neuro: follows commands HEENT: ETT in place Cardiac: regular, 2/6 SM Chest: no wheeze Abd: soft, non tender Ext: no edema Skin: no rashes   CMP Latest Ref Rng & Units 02/29/2016 02/28/2016 02/28/2016  Glucose 65 - 99 mg/dL 92 101(H) 104(H)  BUN 6 - 20 mg/dL 13 15 13   Creatinine 0.61 - 1.24 mg/dL 1.56(H) 1.60(H) 1.55(H)  Sodium 135 - 145 mmol/L 138 140 140  Potassium 3.5 - 5.1 mmol/L 3.6 3.9 3.8  Chloride 101 - 111 mmol/L 108 106 107  CO2 22 - 32 mmol/L 19(L) - 26  Calcium 8.9 - 10.3 mg/dL 8.7(L) - 8.9  Total Protein 6.5 - 8.1 g/dL - - 6.8  Total Bilirubin 0.3 - 1.2 mg/dL - - 0.8  Alkaline Phos 38 - 126 U/L - - 97  AST 15 - 41 U/L - - 22  ALT 17 - 63 U/L - - 21    CBC Latest Ref Rng & Units 02/29/2016 02/28/2016 02/28/2016  WBC 4.0 - 10.5 K/uL 7.3 - 6.3  Hemoglobin 13.0 - 17.0 g/dL 14.3 14.3 13.4  Hematocrit 39.0 - 52.0 % 43.3 42.0 40.3  Platelets 150 - 400 K/uL 97(L) - 115(L)    ABG    Component Value Date/Time   PHART 7.393 02/29/2016 0416   PCO2ART 38.4 02/29/2016 0416   PO2ART 110.0 (H) 02/29/2016 0416   HCO3 23.0 02/29/2016 0416   TCO2 24 02/29/2016 0416   ACIDBASEDEF 1.0 02/29/2016 0416   O2SAT 98.0 02/29/2016 0416    CBG (last 3)   Recent Labs  02/29/16 2332 03/01/16 0332 03/01/16 0805  GLUCAP 99 110* 116*    Imaging: Ct Angio Head W Or Wo Contrast  Result Date: 02/28/2016 CLINICAL DATA:  Suspected stroke.  Seizure during the examination. EXAM: CT ANGIOGRAPHY HEAD AND NECK TECHNIQUE:  Multidetector CT imaging of the head and neck was performed using the standard protocol during bolus administration of intravenous contrast. Multiplanar CT image reconstructions and MIPs were obtained to evaluate the vascular anatomy. Carotid stenosis measurements (when applicable) are obtained utilizing NASCET criteria, using the distal internal carotid diameter as the denominator. CONTRAST:  50 mL Isovue 370 IV COMPARISON:  None. FINDINGS: CTA NECK FINDINGS Aortic arch: There is an aortic arch endograft stent. There is no aneurysm or dissection of the visualized ascending aorta or aortic arch. Proximal subclavian arteries are normal. Right carotid system: The right common carotid origin is widely patent. There is no common carotid or internal carotid artery dissection or aneurysm. No hemodynamically significant stenosis. Left carotid system: The left common carotid origin is widely patent. There is no common carotid or internal carotid artery dissection or aneurysm. No hemodynamically significant stenosis. Vertebral arteries: The vertebral system is left dominant. Both vertebral artery origins are normal. Both vertebral arteries are normal to their confluence with the basilar artery. Skeleton: There is no bony spinal canal stenosis. No lytic or blastic lesions. Other neck: The nasopharynx is clear. The oropharynx and hypopharynx are normal. The epiglottis is  normal. The supraglottic larynx, glottis and subglottic larynx are normal. No retropharyngeal collection. The parapharyngeal spaces are preserved. The parotid and submandibular glands are normal. No sialolithiasis or salivary ductal dilatation. The thyroid gland is normal. There is no cervical lymphadenopathy. Upper chest: There is bilateral upper lobe consolidation. Review of the MIP images confirms the above findings CTA HEAD FINDINGS Anterior circulation: --Intracranial internal carotid arteries: Normal. 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 Otherwise, the anterior cerebral arteries are normal. --Middle cerebral arteries: Moderate narrowing of the left M2 segment proximally. Mild irregularity of the Otherwise normal. --Posterior communicating arteries: Absent bilaterally. Posterior circulation: --Posterior cerebral arteries: There is moderate to severe narrowing of the P2 segment of the right PCA. There is mild multifocal narrowing of the left PCA. --Superior cerebellar arteries: Normal. --Basilar artery: There is basilar dolichoectasia, with luminal diameter measuring up to 5.5 mm. --Anterior inferior cerebellar arteries: Not clearly visualized, which is not uncommon. --Posterior inferior cerebellar arteries: Normal. Venous sinuses: As permitted by contrast timing, patent. Anatomic variants: None Review of the MIP images confirms the above findings IMPRESSION: 1. No intracranial arterial occlusion. 2. Moderate narrowing of the left MCA M2 segments. 3. Moderate to severe narrowing of the right PCA P2 segment. 4. Basilar artery dolichoectasia. 5. Bilateral pulmonary upper lobe consolidation, likely aspiration or pneumonia. 6. Normal CTA of the neck. 7. Aortic atherosclerosis. Electronically Signed   By: Ulyses Jarred M.D.   On: 02/28/2016 22:19   Ct Angio Neck W Or Wo Contrast  Result Date: 02/28/2016 CLINICAL DATA:  Suspected stroke.  Seizure during the examination. EXAM: CT ANGIOGRAPHY HEAD AND NECK TECHNIQUE: Multidetector CT imaging of the head and neck was performed using the standard protocol during bolus administration of intravenous contrast. Multiplanar CT image reconstructions and MIPs were obtained to evaluate the vascular anatomy. Carotid stenosis measurements (when applicable) are obtained utilizing NASCET criteria, using the distal internal carotid diameter as the denominator. CONTRAST:  50 mL Isovue 370 IV COMPARISON:  None. FINDINGS: CTA NECK FINDINGS Aortic arch: There is an aortic arch  endograft stent. There is no aneurysm or dissection of the visualized ascending aorta or aortic arch. Proximal subclavian arteries are normal. Right carotid system: The right common carotid origin is widely patent. There is no common carotid or internal carotid artery dissection or aneurysm. No hemodynamically significant stenosis. Left carotid system: The left common carotid origin is widely patent. There is no common carotid or internal carotid artery dissection or aneurysm. No hemodynamically significant stenosis. Vertebral arteries: The vertebral system is left dominant. Both vertebral artery origins are normal. Both vertebral arteries are normal to their confluence with the basilar artery. Skeleton: There is no bony spinal canal stenosis. No lytic or blastic lesions. Other neck: The nasopharynx is clear. The oropharynx and hypopharynx are normal. The epiglottis is normal. The supraglottic larynx, glottis and subglottic larynx are normal. No retropharyngeal collection. The parapharyngeal spaces are preserved. The parotid and submandibular glands are normal. No sialolithiasis or salivary ductal dilatation. The thyroid gland is normal. There is no cervical lymphadenopathy. Upper chest: There is bilateral upper lobe consolidation. Review of the MIP images confirms the above findings CTA HEAD FINDINGS Anterior circulation: --Intracranial internal carotid arteries: Normal. 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 Otherwise, the anterior cerebral arteries are normal. --Middle cerebral arteries: Moderate narrowing of the left M2 segment proximally. Mild irregularity of the Otherwise normal. --Posterior communicating arteries: Absent  bilaterally. Posterior circulation: --Posterior cerebral arteries: There is moderate to severe narrowing of the P2 segment of the right PCA. There is mild multifocal narrowing of the left PCA. --Superior cerebellar arteries: Normal.  --Basilar artery: There is basilar dolichoectasia, with luminal diameter measuring up to 5.5 mm. --Anterior inferior cerebellar arteries: Not clearly visualized, which is not uncommon. --Posterior inferior cerebellar arteries: Normal. Venous sinuses: As permitted by contrast timing, patent. Anatomic variants: None Review of the MIP images confirms the above findings IMPRESSION: 1. No intracranial arterial occlusion. 2. Moderate narrowing of the left MCA M2 segments. 3. Moderate to severe narrowing of the right PCA P2 segment. 4. Basilar artery dolichoectasia. 5. Bilateral pulmonary upper lobe consolidation, likely aspiration or pneumonia. 6. Normal CTA of the neck. 7. Aortic atherosclerosis. Electronically Signed   By: Ulyses Jarred M.D.   On: 02/28/2016 22:19   Dg Chest Port 1 View  Result Date: 03/01/2016 CLINICAL DATA:  Respiratory failure. EXAM: PORTABLE CHEST 1 VIEW COMPARISON:  02/28/2016. FINDINGS: Stable enlarged cardiac silhouette, median sternotomy wires, prosthetic aortic valve and aortic stent graft. Nasogastric tube tip in the proximal stomach. Endotracheal tube in satisfactory position. Increased patchy opacity at the left lung base and interval mild patchy opacity at the right lung base. Unremarkable bones. IMPRESSION: 1. Increased left basilar atelectasis, aspiration pneumonitis or pneumonia. 2. Interval mild right basilar atelectasis, pneumonia or aspiration pneumonitis. Electronically Signed   By: Claudie Revering M.D.   On: 03/01/2016 07:20   Dg Chest Portable 1 View  Result Date: 02/28/2016 CLINICAL DATA:  Emergent intubation. EXAM: PORTABLE CHEST 1 VIEW COMPARISON:  CT of the chest 10/13/2015 FINDINGS: Endotracheal tube terminates 2.4 cm above the carina. Enteric catheters collimated off the image. Changes from median sternotomy and aortic valve placement are seen. Aortic stent graft is stable radiographically with increased density along the expected course of the distal aorta. The  cardiac silhouette is enlarged. Low lung volumes. There is no evidence of focal airspace consolidation, pleural effusion or pneumothorax. Mild haziness of the lungs may represent pulmonary edema. Osseous structures are without acute abnormality. Soft tissues are grossly normal. IMPRESSION: Endotracheal tube terminates 2.4 cm above the carina. Enlargement of the cardiac silhouette. Stable appearance of the aortic stent graft with increased opacity along the lateral border of the descending aorta at the site of known aortic dissection and aneurysmal dilation. This may represent enlargement of the known aortic dissection, however this radiographic appearance may be produced by incomplete inflation of the lungs an AP technique as well. Electronically Signed   By: Fidela Salisbury M.D.   On: 02/28/2016 21:38   Dg Abd Portable 1v  Result Date: 02/29/2016 CLINICAL DATA:  OG tube placement EXAM: PORTABLE ABDOMEN - 1 VIEW COMPARISON:  None. FINDINGS: OG tube coils in the fundus of the stomach. Aortic stents noted. Nonobstructive bowel gas pattern. IMPRESSION: OG tube coils in the fundus of the stomach. Electronically Signed   By: Rolm Baptise M.D.   On: 02/29/2016 07:33   Ct Head Code Stroke W/o Cm  Result Date: 02/28/2016 CLINICAL DATA:  Code stroke. Altered mental status. Minimal unresponsiveness. EXAM: CT HEAD WITHOUT CONTRAST TECHNIQUE: Contiguous axial images were obtained from the base of the skull through the vertex without intravenous contrast. COMPARISON:  Head CT 09/27/2014 FINDINGS: Brain: No mass lesion, intraparenchymal hemorrhage or extra-axial collection. No evidence of acute cortical infarct. There is periventricular hypoattenuation compatible with chronic microvascular disease. There are areas in both frontal lobes that appear to worsened slightly from  the prior study. Vascular: No hyperdense vessel or unexpected calcification. Skull: Normal visualized skull base, calvarium and extracranial soft  tissues. Sinuses/Orbits: No sinus fluid levels or advanced mucosal thickening. No mastoid effusion. Normal orbits. ASPECTS [facility] Stroke Program Early CT Score) - Ganglionic level infarction (caudate, lentiform nuclei, internal capsule, insula, M1-M3 cortex): 7 - Supraganglionic infarction (M4-M6 cortex): 3 Total score (0-10 with 10 being normal): 10 IMPRESSION: 1. No acute intracranial abnormality. Slight worsening of bilateral frontal predominant white matter disease, likely chronic microvascular ischemia. 2. ASPECTS is 10. These results were called by telephone at the time of interpretation on 02/28/2016 at 8:27 pm to Dr. Delora Fuel , who verbally acknowledged these results. Electronically Signed   By: Ulyses Jarred M.D.   On: 02/28/2016 20:27    Studies: CT head, neck 12/15 >> no CVA  Antibiotics: Vancomycin 12/16 >> Zosyn 12/16 >>  Cultures: Blood 12/16 >> Sputum 12/16 >> Urine 12/16 >> negative  Lines/tubes: ETT 12/15 >> 12/17  Events: 12/15 Admit, neuro consulted 12/16 Fever >> add Abx  Summary: 45 yo male smoker with Rt arm/leg weakness, aphasia and altered mental status.  Developed seizure.  Intubated for airway protection.  PMhx of CAD, Aortic dissection, non ischemic CM, HLD, HTN, ETOH, CKD, GERD, THC  Assessment/plan:  Acute encephalopathy. Seizure. - AEDs per neurology  Compromised airway. - proceed with extubation 12/17  HTN emergency. Hx of CAD, combined chronic CHF. - cardene gtt to keep SBP < 160 - norvasc, catapres, hydralazine - f/u Echo  Aspiration pneumonia. - day 2 of abx  CKD 3. - monitor renal fx  DVT prophylaxis - SQ heparin SUP - pepcid Nutrition - NPO; advance diet as tolerated after extubation Goals of care - full code  Updated pt's wife at bedside  CC time 31 minutes  Chesley Mires, MD [place] 03/01/2016, 9:41 AM Pager:  [phone] After 3pm call: [phone]

## 2016-03-01 NOTE — Procedures (Signed)
Extubation Procedure Note  Patient Details:   Name: [name] DOB: Jul 28, 1970 MRN: [id number]   Airway Documentation:     Evaluation  O2 sats: stable throughout Complications: No apparent complications Patient did tolerate procedure well. Bilateral Breath Sounds: Clear, Diminished   Yes   Positive cuff leak noted.  Pt placed on nasal cannula 4 Lpm with humidity. No stridor noted.  Pt able to reach 1044ml using incentive spirometer.  Mingo Amber Courtany Mcmurphy 03/01/2016, 10:16 AM

## 2016-03-01 NOTE — Progress Notes (Signed)
  Echocardiogram 2D Echocardiogram has been performed.  [name] 03/01/2016, 1:39 PM

## 2016-03-02 ENCOUNTER — Encounter (HOSPITAL_COMMUNITY): Payer: Self-pay | Admitting: *Deleted

## 2016-03-02 DIAGNOSIS — N184 Chronic kidney disease, stage 4 (severe): Secondary | ICD-10-CM

## 2016-03-02 DIAGNOSIS — J9601 Acute respiratory failure with hypoxia: Secondary | ICD-10-CM

## 2016-03-02 DIAGNOSIS — E440 Moderate protein-calorie malnutrition: Secondary | ICD-10-CM

## 2016-03-02 LAB — CBC
HEMATOCRIT: 34.2 % — AB (ref 39.0–52.0)
HEMOGLOBIN: 11.5 g/dL — AB (ref 13.0–17.0)
MCH: 31.2 pg (ref 26.0–34.0)
MCHC: 33.6 g/dL (ref 30.0–36.0)
MCV: 92.7 fL (ref 78.0–100.0)
Platelets: 89 10*3/uL — ABNORMAL LOW (ref 150–400)
RBC: 3.69 MIL/uL — ABNORMAL LOW (ref 4.22–5.81)
RDW: 15.1 % (ref 11.5–15.5)
WBC: 11 10*3/uL — ABNORMAL HIGH (ref 4.0–10.5)

## 2016-03-02 LAB — CULTURE, RESPIRATORY

## 2016-03-02 LAB — BASIC METABOLIC PANEL
ANION GAP: 7 (ref 5–15)
BUN: 13 mg/dL (ref 6–20)
CALCIUM: 8.2 mg/dL — AB (ref 8.9–10.3)
CO2: 21 mmol/L — AB (ref 22–32)
Chloride: 110 mmol/L (ref 101–111)
Creatinine, Ser: 1.95 mg/dL — ABNORMAL HIGH (ref 0.61–1.24)
GFR calc Af Amer: 47 mL/min — ABNORMAL LOW (ref >60)
GFR calc non Af Amer: 40 mL/min — ABNORMAL LOW (ref >60)
GLUCOSE: 94 mg/dL (ref 65–99)
POTASSIUM: 3.9 mmol/L (ref 3.5–5.1)
Sodium: 138 mmol/L (ref 135–145)

## 2016-03-02 LAB — PROCALCITONIN: Procalcitonin: 2.26 ng/mL

## 2016-03-02 LAB — CULTURE, RESPIRATORY W GRAM STAIN: Culture: NORMAL

## 2016-03-02 LAB — MAGNESIUM: MAGNESIUM: 1.9 mg/dL (ref 1.7–2.4)

## 2016-03-02 LAB — PHOSPHORUS: Phosphorus: 3 mg/dL (ref 2.5–4.6)

## 2016-03-02 MED ORDER — PNEUMOCOCCAL VAC POLYVALENT 25 MCG/0.5ML IJ INJ
0.5000 mL | INJECTION | INTRAMUSCULAR | Status: DC
  Filled 2016-03-02: qty 0.5

## 2016-03-02 MED ORDER — ENSURE ENLIVE PO LIQD
237.0000 mL | Freq: Two times a day (BID) | ORAL | Status: DC
  Administered 2016-03-02 – 2016-03-06 (×5): 237 mL via ORAL

## 2016-03-02 MED ORDER — HEPARIN SODIUM (PORCINE) 5000 UNIT/ML IJ SOLN
5000.0000 [IU] | Freq: Three times a day (TID) | INTRAMUSCULAR | Status: DC
  Administered 2016-03-02 – 2016-03-06 (×10): 5000 [IU] via SUBCUTANEOUS
  Filled 2016-03-02 (×11): qty 1

## 2016-03-02 NOTE — Progress Notes (Signed)
PCCM Progress Note Admission date: 02/28/2016 Referring provider: Dr. Roxanne Mins, ER  CC: Weakness  Subjective: Tolerating SBT.  Vital signs: BP (!) 166/93   Pulse 76   Temp 98.9 F (37.2 C) (Oral)   Resp (!) 25   Ht 5\' 9"  (1.753 m)   Wt 73.6 kg (162 lb 4.1 oz)   SpO2 100%   BMI 23.96 kg/m   Intake/output: I/O last 3 completed shifts: In: 4411.1 [I.V.:1321.5; Other:750; NG/GT:829.6; IV Piggyback:1510] Out: 3125 [Urine:3125]  General: alert Neuro: follows commands HEENT: ETT in place Cardiac: regular, 2/6 SM Chest: no wheeze Abd: soft, non tender Ext: no edema Skin: no rashes   CMP Latest Ref Rng & Units 03/02/2016 03/01/2016 02/29/2016  Glucose 65 - 99 mg/dL 94 99 92  BUN 6 - 20 mg/dL 13 20 13   Creatinine 0.61 - 1.24 mg/dL 1.95(H) 2.06(H) 1.56(H)  Sodium 135 - 145 mmol/L 138 139 138  Potassium 3.5 - 5.1 mmol/L 3.9 3.9 3.6  Chloride 101 - 111 mmol/L 110 112(H) 108  CO2 22 - 32 mmol/L 21(L) 22 19(L)  Calcium 8.9 - 10.3 mg/dL 8.2(L) 7.9(L) 8.7(L)  Total Protein 6.5 - 8.1 g/dL - - -  Total Bilirubin 0.3 - 1.2 mg/dL - - -  Alkaline Phos 38 - 126 U/L - - -  AST 15 - 41 U/L - - -  ALT 17 - 63 U/L - - -    CBC Latest Ref Rng & Units 03/02/2016 03/01/2016 02/29/2016  WBC 4.0 - 10.5 K/uL 11.0(H) 11.2(H) 7.3  Hemoglobin 13.0 - 17.0 g/dL 11.5(L) 11.6(L) 14.3  Hematocrit 39.0 - 52.0 % 34.2(L) 35.8(L) 43.3  Platelets 150 - 400 K/uL 89(L) 77(L) 97(L)    ABG    Component Value Date/Time   PHART 7.393 02/29/2016 0416   PCO2ART 38.4 02/29/2016 0416   PO2ART 110.0 (H) 02/29/2016 0416   HCO3 23.0 02/29/2016 0416   TCO2 24 02/29/2016 0416   ACIDBASEDEF 1.0 02/29/2016 0416   O2SAT 98.0 02/29/2016 0416    CBG (last 3)   Recent Labs  03/01/16 0332 03/01/16 0805 03/01/16 1211  GLUCAP 110* 116* 92    Imaging: Dg Chest Port 1 View  Result Date: 03/01/2016 CLINICAL DATA:  Respiratory failure. EXAM: PORTABLE CHEST 1 VIEW COMPARISON:  02/28/2016. FINDINGS: Stable  enlarged cardiac silhouette, median sternotomy wires, prosthetic aortic valve and aortic stent graft. Nasogastric tube tip in the proximal stomach. Endotracheal tube in satisfactory position. Increased patchy opacity at the left lung base and interval mild patchy opacity at the right lung base. Unremarkable bones. IMPRESSION: 1. Increased left basilar atelectasis, aspiration pneumonitis or pneumonia. 2. Interval mild right basilar atelectasis, pneumonia or aspiration pneumonitis. Electronically Signed   By: Claudie Revering M.D.   On: 03/01/2016 07:20    Studies: CT head, neck 12/15 >> no CVA  Antibiotics: Vancomycin 12/16 >> Zosyn 12/16 >>  Cultures: Blood 12/16 >> Sputum 12/16 >> Urine 12/16 >> negative  Lines/tubes: ETT 12/15 >> 12/17  Events: 12/15 Admit, neuro consulted 12/16 Fever >> add Abx  I reviewed CXR myself, no acute diease  Summary: 45 yo male smoker with Rt arm/leg weakness, aphasia and altered mental status.  Developed seizure.  Intubated for airway protection.  PMhx of CAD, Aortic dissection, non ischemic CM, HLD, HTN, ETOH, CKD, GERD, THC  Assessment/plan:  Acute encephalopathy. Seizure. - AEDs per neurology  Compromised airway. - Swallow evaluation passed - Advance diet  HTN emergency. Hx of CAD, combined chronic CHF. - Cardene off -  Norvasc, catapres, hydralazine - Echo  Aspiration pneumonia. - Day 3 of abx (vanc/zosyn) - F/U on cultures  CKD 3. - Monitor renal fx  DVT prophylaxis - SQ heparin SUP - Pepcid Nutrition - NPO; advance diet as tolerated after extubation Goals of care - full code  Discussed with PCCM-NP and TRH-MD, transfer to tele and to [facility] service with PCCM off 12/19.  Rush Farmer, M.D. [facility] Pulmonary/Critical Care Medicine. Pager: [phone]. After hours pager: [phone].

## 2016-03-02 NOTE — Progress Notes (Signed)
Pt arrived on unit in a stable condition. Pt placed on telemetry, CCMD notified. Call bell within reach. Will continue to monitor.

## 2016-03-02 NOTE — Evaluation (Signed)
Physical Therapy Evaluation Patient Details Name: [name] MRN: [id number] DOB: [birth year] Today's Date: 03/02/2016   History of Present Illness  Patient is a 45 year old male admitted 02/28/16 for confusion, R sideed weakness, and altered mental status. Patient had a seizure and shallow respirations while in CT. Intubated 02/28/16, extubated 03/01/16. PMH includes anxiety, CAD, aortic dissection and repair, CHF, CKD, HTN, and HLD.     Clinical Impression  Patient presents with decreased mobility, decreased fine motor of hands more so on the right hand, decreased cognition, and decreased balance and would benefit from physical therapy to address these deficits and maximize patient's function upon return to home. Patient has a fair prognosis due to positive prognostic factors of familial support, motivation, and young age, and negative prognostic factors of complicated cardiovascular history. During the session, patient's gait was at times unsteady. He also noted that his legs felt weak during mobility. Patient was frustrated by cognitive deficits and said that "this isn't like me" several times. Patient and wife would like home health PT at this time due to wife going back to work and patient not currently driving. Speech and OT recommended for this patient to address cognitive and fine motor control deficits, respectively. Will continue to follow.   HR during session: 81-89 SPO2 during session room air: 95%     Follow Up Recommendations Home health PT;Supervision for mobility/OOB    Equipment Recommendations  None recommended by PT    Recommendations for Other Services OT consult;Speech consult     Precautions / Restrictions Precautions Precautions: Fall Restrictions Weight Bearing Restrictions: No      Mobility  Bed Mobility Overal bed mobility: Needs Assistance Bed Mobility: Supine to Sit     Supine to sit: Supervision     General bed mobility comments: Patient sat up  with increased time.   Transfers Overall transfer level: Needs assistance   Transfers: Sit to/from Stand Sit to Stand: Min guard         General transfer comment: patient required slight steadying in standing.   Ambulation/Gait Ambulation/Gait assistance: Min guard Ambulation Distance (Feet): 350 Feet Assistive device: Rolling walker (2 wheeled) Gait Pattern/deviations: Step-through pattern;Decreased stance time - left;Drifts right/left Gait velocity: slightly decreased    General Gait Details: patient had some unsteadiness during ambulation, with one bout of self-corrected scissoring. Minor truncal corrections provided.   Stairs Stairs: Yes Stairs assistance: Supervision Stair Management: One rail Right Number of Stairs: 4 General stair comments: patient required cuing for turning on stairs. Took increased time to ascend and descend stairs.   Wheelchair Mobility    Modified Rankin (Stroke Patients Only)       Balance Overall balance assessment: Needs assistance Sitting-balance support: No upper extremity supported;Feet unsupported Sitting balance-Leahy Scale: Good     Standing balance support: No upper extremity supported Standing balance-Leahy Scale: Fair Standing balance comment: patient able to stand unsupported, but  Single Leg Stance - Right Leg: 3 Single Leg Stance - Left Leg: 1 Tandem Stance - Right Leg: 0 (attempted and unable to complete) Tandem Stance - Left Leg: 0 (attempted and unable to complete )   Rhomberg - Eyes Closed: 30     Standardized Balance Assessment Standardized Balance Assessment : Berg Balance Test           Pertinent Vitals/Pain Pain Assessment: No/denies pain    Home Living Family/patient expects to be discharged to:: Private residence Living Arrangements: Spouse/significant other;Children (three children ) Available Help at Discharge: Family;Available  24 hours/day (wife taking leave from work) Type of Home: House Home  Access: Stairs to enter Entrance Stairs-Rails: Can reach both Entrance Stairs-Number of Steps: [place]: One level [place]: [place] - single point      Prior Function Level of Independence: Independent               Hand Dominance   Dominant Hand: Left (ambidextrous)    Extremity/Trunk Assessment   Upper Extremity Assessment Upper Extremity Assessment: RUE deficits/detail;LUE deficits/detail RUE Deficits / Details: decreased grip strength, unable to complete finger to thumb task on right  RUE Coordination: decreased fine motor LUE Deficits / Details: decreased grip strength, increased time to complete finger to thumb task on left     Lower Extremity Assessment Lower Extremity Assessment: Overall WFL for tasks assessed (bilaterally 5/5 for hip flexion, knee flexion and extrension, and hip abduction and adduction )       Communication   Communication: No difficulties  Cognition Arousal/Alertness: Awake/alert Behavior During Therapy: WFL for tasks assessed/performed Overall Cognitive Status: Impaired/Different from baseline Area of Impairment: Problem solving;Memory;Orientation Orientation Level: Person           Problem Solving: Slow processing General Comments: Originally said his birthday was in 1962 instead of 1972, but self corrected when asked again. Patient unable to spell the word "world" or perform simple math tasks.     General Comments      Exercises     Assessment/Plan    PT Assessment Patient needs continued PT services  PT Problem List Decreased balance;Decreased mobility;Decreased coordination;Decreased cognition          PT Treatment Interventions Gait training;Stair training;Functional mobility training;Therapeutic activities;Therapeutic exercise;Balance training;Patient/family education;Cognitive remediation    PT Goals (Current goals can be found in the Care Plan section)  Acute Rehab PT Goals Patient Stated Goal: return to  independence, work on cars  PT Goal Formulation: With patient Time For Goal Achievement: 03/16/16 Potential to Achieve Goals: Good Additional Goals Additional Goal #1: Pt will complete Berg with score >45 to decrease fall risk    Frequency Min 3X/week   Barriers to discharge        Co-evaluation               End of Session Equipment Utilized During Treatment: Gait belt Activity Tolerance: Patient tolerated treatment well;Patient limited by fatigue Patient left: in chair;with call bell/phone within reach;with family/visitor present           Time: 1228-1300 PT Time Calculation (min) (ACUTE ONLY): 32 min   Charges:   PT Evaluation $PT Eval Moderate Complexity: 1 Procedure PT Treatments $Gait Training: 8-22 mins   PT G Codes:        Mirza Fessel 2016/03/16, 2:29 PM [place] SPT [phone]

## 2016-03-02 NOTE — Progress Notes (Signed)
Subjective: Much improved  Exam: Vitals:   03/02/16 0800 03/02/16 0900  BP: (!) 166/95 (!) 166/93  Pulse:  76  Resp: (!) 25   Temp: 98.9 F (37.2 C)    Gen: In bed, NAD Resp: non-labored breathing, no acute distress Abd: soft, nt  Neuro: MS: awake, alert, oriented, complains of mild word finding difficulty TV:NRWCH, VFF, EOMI Motor: MAEW Sensory:decreased to LT throughout right side.   Pertinent Labs: Cr 1.95  Impression: 45 yo M with new onset seizure with subsequent right sided numbness. Concern for possible stroke. With rapid improvement compared to admission, seems less likely to be CNS infection, though if further deficits or other concerns, low threshold to LP.   Recommendations: 1) MRI brain 2) continue keppra 500mg  BID. 3) will continue to follow.   Roland Rack, MD [facility] [phone]  If 7pm- 7am, please page neurology on call as listed in [place].

## 2016-03-02 NOTE — Progress Notes (Signed)
Nutrition Follow-up  DOCUMENTATION CODES:   Non-severe (moderate) malnutrition in context of chronic illness  INTERVENTION:   Ensure Enlive po BID, each supplement provides 350 kcal and 20 grams of protein  NUTRITION DIAGNOSIS:   Inadequate oral intake related to  (decreased functional status) as evidenced by meal completion < 50%. Ongoing.   GOAL:   Patient will meet greater than or equal to 90% of their needs Progressing.   MONITOR:   PO intake, Supplement acceptance, I & O's  ASSESSMENT:   45 y/o male PMHx Aortic dissection/reconstruction, HF, HTN, Anxiety, CAD, ETOH/Tobacco abuse, HLD, GERD. Presented to ED with confusion and R hemiplegia. Code stroke. Had witnessed seizure.  12/17 extubated Pt with rapid improvement since admission. On seizure medication and MRI pending.  Pt with weakness and limited fine motor skills per RN. Pt has to be fed. Spoke with wife who fed pt for Breakfast. She reports he ate < 50% of his meal and had to be fed. She thinks he would like strawberry ensure.   Diet Order:  Diet Heart Room service appropriate? Yes; Fluid consistency: Thin  Skin:  Reviewed, no issues  Last BM:  Unknown  Height:   Ht Readings from Last 1 Encounters:  02/29/16 5\' 9"  (1.753 m)    Weight:   Wt Readings from Last 1 Encounters:  03/02/16 162 lb 4.1 oz (73.6 kg)    Ideal Body Weight:  72.73 kg  BMI:  Body mass index is 23.96 kg/m.  Estimated Nutritional Needs:   Kcal:  2000-2200  Protein:  98-112 g (1.4-1.6 g/kg bw)  Fluid:  2.1-2.4 L (30-35 ml/kg bw)  EDUCATION NEEDS:   No education needs identified at this time  [place], [place], [place] Pager [phone] After Hours Pager

## 2016-03-03 ENCOUNTER — Inpatient Hospital Stay (HOSPITAL_COMMUNITY): Payer: Medicare Other

## 2016-03-03 DIAGNOSIS — I63412 Cerebral infarction due to embolism of left middle cerebral artery: Secondary | ICD-10-CM

## 2016-03-03 DIAGNOSIS — D696 Thrombocytopenia, unspecified: Secondary | ICD-10-CM

## 2016-03-03 DIAGNOSIS — I10 Essential (primary) hypertension: Secondary | ICD-10-CM

## 2016-03-03 DIAGNOSIS — G9340 Encephalopathy, unspecified: Secondary | ICD-10-CM

## 2016-03-03 DIAGNOSIS — J690 Pneumonitis due to inhalation of food and vomit: Secondary | ICD-10-CM

## 2016-03-03 LAB — HEPATIC FUNCTION PANEL
ALBUMIN: 2.7 g/dL — AB (ref 3.5–5.0)
ALK PHOS: 64 U/L (ref 38–126)
ALT: 19 U/L (ref 17–63)
AST: 29 U/L (ref 15–41)
Bilirubin, Direct: 0.2 mg/dL (ref 0.1–0.5)
Indirect Bilirubin: 0.9 mg/dL (ref 0.3–0.9)
TOTAL PROTEIN: 5.9 g/dL — AB (ref 6.5–8.1)
Total Bilirubin: 1.1 mg/dL (ref 0.3–1.2)

## 2016-03-03 LAB — BASIC METABOLIC PANEL
ANION GAP: 7 (ref 5–15)
BUN: 10 mg/dL (ref 6–20)
CO2: 23 mmol/L (ref 22–32)
Calcium: 8.6 mg/dL — ABNORMAL LOW (ref 8.9–10.3)
Chloride: 109 mmol/L (ref 101–111)
Creatinine, Ser: 1.69 mg/dL — ABNORMAL HIGH (ref 0.61–1.24)
GFR calc Af Amer: 56 mL/min — ABNORMAL LOW (ref >60)
GFR, EST NON AFRICAN AMERICAN: 48 mL/min — AB (ref >60)
GLUCOSE: 129 mg/dL — AB (ref 65–99)
POTASSIUM: 3.5 mmol/L (ref 3.5–5.1)
Sodium: 139 mmol/L (ref 135–145)

## 2016-03-03 LAB — CBC
HEMATOCRIT: 35.1 % — AB (ref 39.0–52.0)
HEMOGLOBIN: 11.7 g/dL — AB (ref 13.0–17.0)
MCH: 30.7 pg (ref 26.0–34.0)
MCHC: 33.3 g/dL (ref 30.0–36.0)
MCV: 92.1 fL (ref 78.0–100.0)
PLATELETS: 80 10*3/uL — AB (ref 150–400)
RBC: 3.81 MIL/uL — AB (ref 4.22–5.81)
RDW: 14.7 % (ref 11.5–15.5)
WBC: 7 10*3/uL (ref 4.0–10.5)

## 2016-03-03 LAB — MAGNESIUM: Magnesium: 2 mg/dL (ref 1.7–2.4)

## 2016-03-03 LAB — DIC (DISSEMINATED INTRAVASCULAR COAGULATION) PANEL
APTT: 47 s — AB (ref 24–36)
FIBRINOGEN: 454 mg/dL (ref 210–475)
PROTHROMBIN TIME: 13.4 s (ref 11.4–15.2)

## 2016-03-03 LAB — DIC (DISSEMINATED INTRAVASCULAR COAGULATION)PANEL
D-Dimer, Quant: >20 ug/mL-FEU — ABNORMAL HIGH (ref 0.00–0.50)
INR: 1.01
Platelets: 92 10*3/uL — ABNORMAL LOW (ref 150–400)
Smear Review: NONE SEEN

## 2016-03-03 LAB — PHOSPHORUS: Phosphorus: 3.3 mg/dL (ref 2.5–4.6)

## 2016-03-03 LAB — LIPASE, BLOOD: LIPASE: 13 U/L (ref 11–51)

## 2016-03-03 MED ORDER — STROKE: EARLY STAGES OF RECOVERY BOOK
Freq: Once | Status: DC
  Filled 2016-03-03: qty 1

## 2016-03-03 MED ORDER — ASPIRIN EC 325 MG PO TBEC
325.0000 mg | DELAYED_RELEASE_TABLET | Freq: Every day | ORAL | Status: DC
  Administered 2016-03-05: 325 mg via ORAL
  Filled 2016-03-03: qty 1

## 2016-03-03 MED ORDER — LABETALOL HCL 200 MG PO TABS
300.0000 mg | ORAL_TABLET | Freq: Two times a day (BID) | ORAL | Status: DC
  Administered 2016-03-03 – 2016-03-06 (×7): 300 mg via ORAL
  Filled 2016-03-03 (×7): qty 1

## 2016-03-03 MED ORDER — SODIUM CHLORIDE 0.9 % IV SOLN: INTRAVENOUS | Status: DC

## 2016-03-03 NOTE — Progress Notes (Signed)
    [facility] has been requested to perform a transesophageal echocardiogram on [name] for CVA .  After careful review of history and examination, the risks and benefits of transesophageal echocardiogram have been explained including risks of esophageal damage, perforation (1:10,000 risk), bleeding, pharyngeal hematoma as well as other potential complications associated with conscious sedation including aspiration, arrhythmia, respiratory failure and death. Alternatives to treatment were discussed, questions were answered. Patient is willing to proceed.   Cecilie Kicks, NP  03/03/2016 5:04 PM

## 2016-03-03 NOTE — Progress Notes (Signed)
[facility] PROGRESS NOTE  EAGLE PITTA PQZ:[id number] DOB: 03-Nov-1970 DOA: 02/28/2016  PCP: [name], MD  Brief History/Interval Summary: 45 year old male with a complicated cardiovascular history including aortic dissection s/p repair and reconstruction, systolic and diastolic HF, HTN who presented to the ED with confusion, R sided weakness, and changes in mental status as a code stroke. He had a stat head CT and had a witnessed seizure event which resolved spontaneously, although he was treated with ativan. Subsequently, he had depressed mental status and respiratory drive, and was intubated for airway protection. PCCM was called for admission. Patient was admitted to the intensive care unit. He was seen by neurology. Subsequently extubated. And then transferred to the floor.  Reason for Visit: Seizure. Acute encephalopathy  Consultants: Neurology  Procedures:  EEG "This EEG exhibits a pattern consistent with normal sedation. No electrographic seizures noted. Please, be aware that a normal EEG does not exclude the possibility of epilepsy."  ECHO Study Conclusions  - Left ventricle: The cavity size was normal. Wall thickness was   increased in a pattern of severe LVH. Systolic function was   normal. The estimated ejection fraction was in the range of 55%   to 60%. Wall motion was normal; there were no regional wall   motion abnormalities. Features are consistent with a pseudonormal   left ventricular filling pattern, with concomitant abnormal   relaxation and increased filling pressure (grade 2 diastolic   dysfunction). - Aortic valve: A bioprosthesis was present and functioning   normally. Valve area (VTI): 1.74 cm^2. Valve area (Vmax): 1.56   cm^2. Valve area (Vmean): 1.69 cm^2. - Aorta: The proximal aorta is dilated. In one view what appears to   be a disseection flap is seen more distally . Suggest TEE if   further eval needed. - Mitral valve: There was mild to  moderate regurgitation. - Left atrium: The atrium was moderately dilated.  Antibiotics: Vancomycin and Zosyn 12/16  Subjective/Interval History: Patient complains of headache which is diffuse. Denies any visual impairment. Complains of nausea with dry heaves. Also complains of upper abdominal pain. Symptoms a bit distracted.  ROS: Denies any chest pain but does admit to shortness of breath.  Objective:  Vital Signs  Vitals:   03/02/16 1236 03/02/16 1541 03/02/16 2134 03/03/16 0455  BP:  (!) 174/95 (!) 161/89 (!) 166/90  Pulse: 81  71 (!) 56  Resp:    18  Temp:   98.7 F (37.1 C) 99 F (37.2 C)  TempSrc:   Oral Oral  SpO2: 95%  98% 100%  Weight:    72.8 kg (160 lb 7.9 oz)  Height:        Intake/Output Summary (Last 24 hours) at 03/03/16 1058 Last data filed at 03/03/16 1009  Gross per 24 hour  Intake           2012.5 ml  Output             1575 ml  Net            437.5 ml   Filed Weights   03/01/16 0630 03/02/16 0500 03/03/16 0455  Weight: 73.4 kg (161 lb 13.1 oz) 73.6 kg (162 lb 4.1 oz) 72.8 kg (160 lb 7.9 oz)    General appearance: alert, cooperative, appears stated age and no distress Resp: Diminished air entry at the bases. No wheezing. No crackles. Cardio: regular rate and rhythm, S1, S2 normal, no murmur, click, rub or gallop GI: Abdomen is soft. Tenderness appreciated  in the epigastric area without any rebound, rigidity or guarding. No masses or organomegaly. Bowel sounds are present. Extremities: extremities normal, atraumatic, no cyanosis or edema Neurologic: No focal deficits  Lab Results:  Data Reviewed: I have personally reviewed following labs and imaging studies  CBC:  Recent Labs Lab 02/28/16 2007 02/28/16 2015 02/29/16 0236 03/01/16 0856 03/02/16 0451 03/03/16 0455  WBC 6.3  --  7.3 11.2* 11.0* 7.0  NEUTROABS 2.4  --   --   --   --   --   HGB 13.4 14.3 14.3 11.6* 11.5* 11.7*  HCT 40.3 42.0 43.3 35.8* 34.2* 35.1*  MCV 92.4  --  92.3 94.7  92.7 92.1  PLT 115*  --  97* 77* 89* 80*    Basic Metabolic Panel:  Recent Labs Lab 02/28/16 2007 02/28/16 2015  02/29/16 0236 02/29/16 1049 02/29/16 1648 03/01/16 0856 03/02/16 0451 03/03/16 0455  NA 140 140  --  138  --   --  139 138 139  K 3.8 3.9  --  3.6  --   --  3.9 3.9 3.5  CL 107 106  --  108  --   --  112* 110 109  CO2 26  --   --  19*  --   --  22 21* 23  GLUCOSE 104* 101*  --  92  --   --  99 94 129*  BUN 13 15  --  13  --   --  20 13 10   CREATININE 1.55* 1.60*  --  1.56*  --   --  2.06* 1.95* 1.69*  CALCIUM 8.9  --   --  8.7*  --   --  7.9* 8.2* 8.6*  MG  --   --   < > 2.0 2.1 2.0 2.2 1.9 2.0  PHOS  --   --   < > 3.3 2.9 4.1 3.3 3.0 3.3  < > = values in this interval not displayed.  GFR: Estimated Creatinine Clearance: 56.4 mL/min (by C-G formula based on SCr of 1.69 mg/dL (H)).  Liver Function Tests:  Recent Labs Lab 02/28/16 2007  AST 22  ALT 21  ALKPHOS 97  BILITOT 0.8  PROT 6.8  ALBUMIN 3.4*    Coagulation Profile:  Recent Labs Lab 02/28/16 2007  INR 1.08    CBG:  Recent Labs Lab 02/29/16 1940 02/29/16 2332 03/01/16 0332 03/01/16 0805 03/01/16 1211  GLUCAP 94 99 110* 116* 92     Recent Results (from the past 240 hour(s))  Culture, respiratory (NON-Expectorated)     Status: None   Collection Time: 02/29/16  3:54 AM  Result Value Ref Range Status   Specimen Description TRACHEAL ASPIRATE  Final   Special Requests NONE  Final   Gram Stain   Final    ABUNDANT WBC PRESENT, PREDOMINANTLY PMN MODERATE GRAM POSITIVE COCCI IN PAIRS RARE GRAM NEGATIVE COCCI IN PAIRS    Culture Consistent with normal respiratory flora.  Final   Report Status 03/02/2016 FINAL  Final  Culture, blood (Routine X 2) w Reflex to ID Panel     Status: None (Preliminary result)   Collection Time: 02/29/16  4:42 AM  Result Value Ref Range Status   Specimen Description BLOOD LEFT ANTECUBITAL  Final   Special Requests IN PEDIATRIC BOTTLE 3CC  Final   Culture  NO GROWTH 2 DAYS  Final   Report Status PENDING  Incomplete  Culture, blood (Routine X 2) w Reflex to ID Panel  Status: None (Preliminary result)   Collection Time: 02/29/16  4:55 AM  Result Value Ref Range Status   Specimen Description BLOOD LEFT ANTECUBITAL  Final   Special Requests IN PEDIATRIC BOTTLE 3CC  Final   Culture NO GROWTH 2 DAYS  Final   Report Status PENDING  Incomplete  MRSA PCR Screening     Status: None   Collection Time: 02/29/16  5:17 AM  Result Value Ref Range Status   MRSA by PCR NEGATIVE NEGATIVE Final    Comment:        The GeneXpert MRSA Assay (FDA approved for NASAL specimens only), is one component of a comprehensive MRSA colonization surveillance program. It is not intended to diagnose MRSA infection nor to guide or monitor treatment for MRSA infections.   Culture, Urine     Status: None   Collection Time: 02/29/16  5:44 AM  Result Value Ref Range Status   Specimen Description URINE, RANDOM  Final   Special Requests NONE  Final   Culture NO GROWTH  Final   Report Status 03/01/2016 FINAL  Final      Radiology Studies: No results found.   Medications:  Scheduled: . amLODipine  10 mg Oral Daily  . cloNIDine  0.2 mg Oral TID  . famotidine  20 mg Oral QHS  . feeding supplement (ENSURE ENLIVE)  237 mL Oral BID BM  . heparin  5,000 Units Subcutaneous Q8H  . hydrALAZINE  50 mg Oral BID  . levETIRAcetam  500 mg Intravenous Q12H  . piperacillin-tazobactam (ZOSYN)  IV  3.375 g Intravenous Q8H  . pneumococcal 23 valent vaccine  0.5 mL Intramuscular Tomorrow-1000  . vancomycin  1,000 mg Intravenous Q12H   Continuous: . sodium chloride 250 mL (03/02/16 2000)   IEP:PIRJJOACZYSAY **OR** acetaminophen  Assessment/Plan:  Active Problems:   Seizure (HCC)   Malnutrition of moderate degree    Acute encephalopathy. Etiology is unclear. Neurology is following. MRI is pending. May need further neurological workup including CSF  analysis.  Seizure Patient had a single episode of seizure. Hasn't had any further episodes since initial presentation. EEG did not show any epileptiform activity. He is however on Keppra at this time. Neurology is following.  HTN emergency. Patient had to be initially placed on a Cardene infusion. Currently off of it. Continue antihypertensives. Blood pressure is not optimally controlled at this time. May need further adjustment. Patient also on labetalol at home, which has not been initiated yet. Will resume at a lower dose. Monitor Heart rate closely.  Hx of CAD, combined chronic CHF. Appears to be stable. Echocardiogram shows normal systolic function. Grade 2 diastolic dysfunction is noted. Appears to be well compensated at this time.  Thrombocytopenia Etiology is unclear. Could be due to acute illness. Platelet count is stable. Continue to monitor. There is no evidence for bleeding.  Normocytic anemia.  Hemoglobin stable. No evidence of bleeding.  History of aortic dissection Patient is status post aortic valve replacement and graft placement at [facility] last year. Echocardiogram does show a distal flap. This was seen previously as well on a CT scan and will be considered a stable finding.  Aspiration pneumonia. Cultures are negative so far. Patient is on broad-spectrum antibiotics. Continue for now till neurological workup has been completed.   Acute on CKD 3 Renal function has been improving. Continue to monitor.  DVT Prophylaxis: Subcutaneous heparin    Code Status: Full code  Family Communication: Discussed with the patient  Disposition Plan: Management as outlined  above. PT evaluation.    LOS: 4 days   [place] [facility] Pager [phone] 03/03/2016, 10:58 AM  If 7PM-7AM, please contact night-coverage at [email or web address], password [facility]

## 2016-03-03 NOTE — Care Management Note (Addendum)
Case Management Note  Patient Details  Name: [name] MRN: [id number] Date of Birth: Oct 30, 1970  Subjective/Objective:                Admitted with seizures. From home with wife/family.  - MRI reveals  embolic MCA territory infarct  PCP: [name]  Action/Plan: Return to home when medically stable. CM to f/u with disposition needs.  Expected Discharge Date:                  Expected Discharge Plan:  [place]  In-House Referral:     Discharge planning Services  CM Consult  Post Acute Care Choice:    Choice offered to:  Patient  DME Arranged:    DME Agency:     HH Arranged:  PT HH Agency:  [facility]/ referral with [facility]  Status of Service:  In process, will continue to follow  If discussed at Long Length of Stay Meetings, dates discussed:    Additional Comments:  Sharin Mons, RN 03/03/2016, 4:12 PM

## 2016-03-03 NOTE — Care Management Important Message (Signed)
Important Message  Patient Details  Name: [name] MRN: [id number] Date of Birth: [birth year]   Medicare Important Message Given:  Yes    Lynanne Delgreco [place] 03/03/2016, 11:26 AM

## 2016-03-03 NOTE — Progress Notes (Signed)
Pharmacy Antibiotic Note  [name] is a 45 y.o. male admitted on 02/28/2016 with seizure.   Continues on Vancomycin and Zosyn Day # 4  Scr stable, afebrile, cultures negative to date    Plan: Continue Zosyn 3.375 grams iv Q 8 hours Continue Vancomycin 1 gram iv Q 12  Follow up for LOT, vancomycin trough if needed   Height: 5\' 9"  (175.3 cm) Weight: 160 lb 7.9 oz (72.8 kg) IBW/kg (Calculated) : 70.7  Temp (24hrs), Avg:98.6 F (37 C), Min:98.2 F (36.8 C), Max:99 F (37.2 C)   Recent Labs Lab 02/28/16 2007 02/28/16 2015 02/29/16 0236 02/29/16 0448 02/29/16 1049 03/01/16 0856 03/02/16 0451 03/03/16 0455  WBC 6.3  --  7.3  --   --  11.2* 11.0* 7.0  CREATININE 1.55* 1.60* 1.56*  --   --  2.06* 1.95* 1.69*  LATICACIDVEN  --   --   --  2.6* 2.2*  --   --   --     Estimated Creatinine Clearance: 56.4 mL/min (by C-G formula based on SCr of 1.69 mg/dL (H)).    Allergies  Allergen Reactions  . Imdur [Isosorbide Nitrate] Other (See Comments)    headache  . Tramadol Other (See Comments)    "headaches" per pt     Thank you for allowing pharmacy to be a part of this patient's care. Anette Guarneri, PharmD [phone]  03/03/2016 11:45 AM

## 2016-03-03 NOTE — Progress Notes (Signed)
Subjective: Continues to have mild right hemipareiss.   tpa given: no, seizure at onset  Exam: Vitals:   03/03/16 0455 03/03/16 1414  BP: (!) 166/90 (!) 166/86  Pulse: (!) 56 63  Resp: 18 16  Temp: 99 F (37.2 C) 98.5 F (36.9 C)   Gen: In bed, NAD Resp: non-labored breathing, no acute distress Abd: soft, nt  Neuro: MS: awake, alert, oriented, complains of mild word finding difficulty KX:FGHWE, VFF, EOMI Motor: Mild decrease fine motor on the right.  Sensory:decreased to LT throughout right side.   Impression: 45 yo who presented with right sided weakness, subsequently had seizure and cardiac arrest. CTA without clear LVO, and tpa not administered due to seizure at onset and arriving at the cusp of being out of the window. He was started on keppra, but after extubation, continued to have mild right hemipareiss and word finding difficulty. 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    Given his heart history and no clear source on TTE, feel that he would benefit from TEE.   Recommendations: 1) LDL, A1c 2) continue keppra 500mg  BID. 3) transesophageal echo 4) asa 325mg  daily 5) PT, OT, ST 6) Stroke team to follow.   Roland Rack, MD [facility] [phone]  If 7pm- 7am, please page neurology on call as listed in [place].

## 2016-03-04 ENCOUNTER — Encounter (HOSPITAL_COMMUNITY)
Admission: EM | Disposition: A | Discharge status: Home / Self Care | Payer: Self-pay | Source: Home / Self Care | Attending: Pulmonary Disease

## 2016-03-04 ENCOUNTER — Encounter (HOSPITAL_COMMUNITY): Payer: Self-pay | Admitting: *Deleted

## 2016-03-04 ENCOUNTER — Inpatient Hospital Stay (HOSPITAL_COMMUNITY): Payer: Medicare Other

## 2016-03-04 DIAGNOSIS — I639 Cerebral infarction, unspecified: Secondary | ICD-10-CM

## 2016-03-04 DIAGNOSIS — R002 Palpitations: Secondary | ICD-10-CM

## 2016-03-04 DIAGNOSIS — I63412 Cerebral infarction due to embolism of left middle cerebral artery: Principal | ICD-10-CM

## 2016-03-04 DIAGNOSIS — I638 Other cerebral infarction: Secondary | ICD-10-CM

## 2016-03-04 LAB — LIPID PANEL
CHOL/HDL RATIO: 5.9 ratio
CHOLESTEROL: 178 mg/dL (ref 0–200)
HDL: 30 mg/dL — AB (ref >40)
LDL Cholesterol: 124 mg/dL — ABNORMAL HIGH (ref 0–99)
TRIGLYCERIDES: 119 mg/dL (ref <150)
VLDL: 24 mg/dL (ref 0–40)

## 2016-03-04 LAB — COMPREHENSIVE METABOLIC PANEL
ALBUMIN: 2.6 g/dL — AB (ref 3.5–5.0)
ALT: 23 U/L (ref 17–63)
ANION GAP: 5 (ref 5–15)
AST: 31 U/L (ref 15–41)
Alkaline Phosphatase: 62 U/L (ref 38–126)
BUN: 11 mg/dL (ref 6–20)
CALCIUM: 8.5 mg/dL — AB (ref 8.9–10.3)
CHLORIDE: 108 mmol/L (ref 101–111)
CO2: 23 mmol/L (ref 22–32)
Creatinine, Ser: 1.77 mg/dL — ABNORMAL HIGH (ref 0.61–1.24)
GFR calc non Af Amer: 45 mL/min — ABNORMAL LOW (ref >60)
GFR, EST AFRICAN AMERICAN: 53 mL/min — AB (ref >60)
GLUCOSE: 111 mg/dL — AB (ref 65–99)
POTASSIUM: 3 mmol/L — AB (ref 3.5–5.1)
SODIUM: 136 mmol/L (ref 135–145)
Total Bilirubin: 0.8 mg/dL (ref 0.3–1.2)
Total Protein: 6.2 g/dL — ABNORMAL LOW (ref 6.5–8.1)

## 2016-03-04 LAB — CBC
HCT: 34.8 % — ABNORMAL LOW (ref 39.0–52.0)
HEMOGLOBIN: 11.4 g/dL — AB (ref 13.0–17.0)
MCH: 30.1 pg (ref 26.0–34.0)
MCHC: 32.8 g/dL (ref 30.0–36.0)
MCV: 91.8 fL (ref 78.0–100.0)
PLATELETS: 89 10*3/uL — AB (ref 150–400)
RBC: 3.79 MIL/uL — ABNORMAL LOW (ref 4.22–5.81)
RDW: 14.4 % (ref 11.5–15.5)
WBC: 5.9 10*3/uL (ref 4.0–10.5)

## 2016-03-04 LAB — VANCOMYCIN, TROUGH: Vancomycin Tr: 25 ug/mL (ref 15–20)

## 2016-03-04 LAB — PROTIME-INR
INR: 1.07
Prothrombin Time: 14 seconds (ref 11.4–15.2)

## 2016-03-04 SURGERY — INVASIVE LAB ABORTED CASE

## 2016-03-04 MED ORDER — DIPHENHYDRAMINE HCL 50 MG/ML IJ SOLN
INTRAMUSCULAR | Status: AC
  Filled 2016-03-04: qty 1

## 2016-03-04 MED ORDER — HYDRALAZINE HCL 20 MG/ML IJ SOLN
20.0000 mg | Freq: Once | INTRAMUSCULAR | Status: AC
  Administered 2016-03-04: 20 mg via INTRAVENOUS

## 2016-03-04 MED ORDER — HYDROCODONE-ACETAMINOPHEN 5-325 MG PO TABS
1.0000 | ORAL_TABLET | Freq: Once | ORAL | Status: AC
  Administered 2016-03-04: 1 via ORAL
  Filled 2016-03-04: qty 1

## 2016-03-04 MED ORDER — LIDOCAINE VISCOUS 2 % MT SOLN
OROMUCOSAL | Status: AC
  Filled 2016-03-04: qty 15

## 2016-03-04 MED ORDER — HYDRALAZINE HCL 20 MG/ML IJ SOLN
INTRAMUSCULAR | Status: AC
  Filled 2016-03-04: qty 1

## 2016-03-04 MED ORDER — DIPHENHYDRAMINE HCL 50 MG/ML IJ SOLN
INTRAMUSCULAR | Status: DC | PRN
  Administered 2016-03-04: 25 mg via INTRAVENOUS

## 2016-03-04 MED ORDER — FUROSEMIDE 10 MG/ML IJ SOLN
40.0000 mg | Freq: Once | INTRAMUSCULAR | Status: AC
  Administered 2016-03-04: 40 mg via INTRAVENOUS
  Filled 2016-03-04: qty 4

## 2016-03-04 MED ORDER — ATORVASTATIN CALCIUM 20 MG PO TABS
20.0000 mg | ORAL_TABLET | Freq: Every day | ORAL | Status: DC
  Administered 2016-03-04 – 2016-03-06 (×3): 20 mg via ORAL
  Filled 2016-03-04 (×3): qty 1

## 2016-03-04 MED ORDER — FENTANYL CITRATE (PF) 100 MCG/2ML IJ SOLN
INTRAMUSCULAR | Status: DC | PRN
  Administered 2016-03-04: 50 ug via INTRAVENOUS
  Administered 2016-03-04 (×2): 25 ug via INTRAVENOUS

## 2016-03-04 MED ORDER — MIDAZOLAM HCL 5 MG/ML IJ SOLN
INTRAMUSCULAR | Status: AC
  Filled 2016-03-04: qty 2

## 2016-03-04 MED ORDER — BUTAMBEN-TETRACAINE-BENZOCAINE 2-2-14 % EX AERO
INHALATION_SPRAY | CUTANEOUS | Status: DC | PRN
  Administered 2016-03-04: 2 via TOPICAL

## 2016-03-04 MED ORDER — MIDAZOLAM HCL 10 MG/2ML IJ SOLN
INTRAMUSCULAR | Status: DC | PRN
  Administered 2016-03-04 (×2): 1 mg via INTRAVENOUS
  Administered 2016-03-04 (×2): 2 mg via INTRAVENOUS

## 2016-03-04 MED ORDER — VANCOMYCIN HCL IN DEXTROSE 750-5 MG/150ML-% IV SOLN
750.0000 mg | Freq: Two times a day (BID) | INTRAVENOUS | Status: DC
  Administered 2016-03-04 – 2016-03-05 (×2): 750 mg via INTRAVENOUS
  Filled 2016-03-04 (×2): qty 150

## 2016-03-04 MED ORDER — MORPHINE SULFATE (PF) 2 MG/ML IV SOLN
1.0000 mg | Freq: Once | INTRAVENOUS | Status: AC
  Administered 2016-03-04: 1 mg via INTRAVENOUS
  Filled 2016-03-04: qty 1

## 2016-03-04 MED ORDER — LIDOCAINE VISCOUS 2 % MT SOLN
OROMUCOSAL | Status: DC | PRN
  Administered 2016-03-04: 1 via OROMUCOSAL

## 2016-03-04 MED ORDER — POTASSIUM CHLORIDE CRYS ER 20 MEQ PO TBCR
60.0000 meq | EXTENDED_RELEASE_TABLET | Freq: Four times a day (QID) | ORAL | Status: AC
  Administered 2016-03-04 (×2): 60 meq via ORAL
  Filled 2016-03-04 (×2): qty 3

## 2016-03-04 MED ORDER — FENTANYL CITRATE (PF) 100 MCG/2ML IJ SOLN
INTRAMUSCULAR | Status: AC
  Filled 2016-03-04: qty 2

## 2016-03-04 NOTE — Progress Notes (Signed)
Dr. Debara Pickett in to see patient BP 194/99 30 minutes after PO BP meds. Order received for Hydralazine 20mg  IV x1 dose now.

## 2016-03-04 NOTE — CV Procedure (Signed)
    TRANSESOPHAGEAL ECHOCARDIOGRAM (TEE) NOTE  INDICATIONS: stroke, prior thoracic aneurysm repair  PROCEDURE:   Informed consent was obtained prior to the procedure. The risks, benefits and alternatives for the procedure were discussed and the patient comprehended these risks.  Risks include, but are not limited to, cough, sore throat, vomiting, nausea, somnolence, esophageal and stomach trauma or perforation, bleeding, low blood pressure, aspiration, pneumonia, infection, trauma to the teeth and death.    The procedure was delayed initially as the patient's blood pressure was 210/105 in the endoscopy suite. He had not received any of his blood pressure medications this am. He was administered all of his BP meds and 30 minutes elapsed before the BP had fallen to 190/89. He was then given 20 mg IV hydralazine by my order an the BP dropped to 160/85. At this point, we brought him back to start the procedure.  After a procedural time-out, the patient was given 6 mg versed and 100 mcg fentanyl and 25 mg IV benadryl for moderate sedation.  The patient's heart rate, blood pressure, and oxygen saturation are monitored continuously during the procedure.The oropharynx was anesthetized 10 cc of topical 1% viscous lidocaine and 2 cetacaine sprays.  Multiple attempts to place the transesophageal probe were unsuccessful due to the inability to adequately sedate the patient. He became agitated and pulled at the tube several times. He was noted to be tachypneic, but intermittently snored and did maintain a 100% SPO2 on 4L [place]. At this point, I decided to abort the procedure. The patient will likely need MAC sedation.  The period of conscious sedation is 25 minutes, of which I was present face-to-face 100% of this time. The patient left the procedure room in stable condition.   COMPLICATIONS:    There were no immediate complications. The procedure could not be performed due to the inability to adequately sedate  the patient for the procedure.  IMPRESSION:   1.  Unsuccessful TEE with inability to adequately sedate the patient for the procedure safely with moderate sedation.  RECOMMENDATIONS:    1.  We will reschedule the patient with anesthesia at their first available time.  Time Spent Directly with the Patient:  60 minutes   Pixie Casino, MD, [facility] Attending Cardiologist [facility]  03/04/2016, 10:52 AM

## 2016-03-04 NOTE — Progress Notes (Signed)
STROKE TEAM PROGRESS NOTE   HISTORY OF PRESENT ILLNESS (per record) [name] is an 45 y.o. male brought in by EMS for Code Stroke On 02/28/2016. Per EMS the patient was unable to move right arm or leg and unable to follow commands during their assessment. Symptoms began as right hand numbness starting at 1600 today (02/28/2016, LKW), with patient telling his girlfrientd that he just did not feel well. Next, she noticed him dropping objects such as his keys from his right hand.   On arrival he exhibited aphasia and right hemiparesis on exam. CT head revealed no acute intracranial abnormality, with slight worsening of bilateral frontal predominant white matter disease relative to the July 2016 study, as well as chronic microvascular ischemia being noted. CTA was ordered to further evaluate. While obtaining IV access for CTA, the patient started seizing on the CT table, which resolved spontaneously after about 2 minutes. Seizure consisted of tonic clonic activity involving the left more than right side, with LOC and frothy secretions from mouth. He was postictal afterwards, unresponsive to commands and with sonorous breathing. He was given 1 mg Ativan and placed on non-rebreather. Frothy secretions continued and he was felt to be an aspiration risk. He was taken back to the trauma bay for intubation.   Following intubation and sedation with Versed, he was taken emergently back to [facility] for CTA of head and neck, which revealed no LVO. 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   He was then loaded with IV Keppra 1000 mg and preparations were made to admit him to the ICU. An EEG has been ordered in addition to scheduled Keppra 500 mg IV q12h.   Due to non-stroke presentation, he was not considered for IV TPA. He was admitted for further evaluation and treatment.  Neurology followed  during hospitalization. Patient presented with right sided weakness with subsequent seizure in cardiac arrest. CTA without clear LVO and TPA not administered due to seizure at onset and delay in arrival. He was started on Keppra, after extubation continued to have mild right hemiparesis and word finding difficulty. MRI showed an embolic MCA territory infarct. Due to unknown embolic source, TEE was ordered and patient was placed on stroke service.   SUBJECTIVE (INTERVAL HISTORY) No family at bedside. Pt just had TEE attempt today but not successful due to difficulty with sedation. Will plan to do it later. Pt also complains of heart palpitation since the aortic dissection repair surgery last year. It could start and stop suddenly.   OBJECTIVE Temp:  [98.1 F (36.7 C)-99.4 F (37.4 C)] 98.7 F (37.1 C) (12/20 0855) Pulse Rate:  [63-73] 66 (12/20 0537) Cardiac Rhythm: Normal sinus rhythm (12/19 2130) Resp:  [16-22] 22 (12/20 0855) BP: (144-218)/(77-107) 194/99 (12/20 0959) SpO2:  [97 %-99 %] 97 % (12/20 0855) Weight:  [70.5 kg (155 lb 6.8 oz)] 70.5 kg (155 lb 6.8 oz) (12/20 0537)  CBC:  Recent Labs Lab 02/28/16 2007  03/03/16 0455 03/03/16 1640 03/04/16 0512  WBC 6.3  < > 7.0  --  5.9  NEUTROABS 2.4  --   --   --   --   HGB 13.4  < > 11.7*  --  11.4*  HCT 40.3  < > 35.1*  --  34.8*  MCV 92.4  < > 92.1  --  91.8  PLT 115*  < >  80* 92* 89*  < > = values in this interval not displayed.  Basic Metabolic Panel:  Recent Labs Lab 03/02/16 0451 03/03/16 0455 03/04/16 0512  NA 138 139 136  K 3.9 3.5 3.0*  CL 110 109 108  CO2 21* 23 23  GLUCOSE 94 129* 111*  BUN 13 10 11   CREATININE 1.95* 1.69* 1.77*  CALCIUM 8.2* 8.6* 8.5*  MG 1.9 2.0  --   PHOS 3.0 3.3  --     Lipid Panel:    Component Value Date/Time   CHOL 178 03/04/2016 0512   TRIG 119 03/04/2016 0512   HDL 30 (L) 03/04/2016 0512   CHOLHDL 5.9 03/04/2016 0512   VLDL 24 03/04/2016 0512   LDLCALC 124 (H) 03/04/2016  0512   HgbA1c:  Lab Results  Component Value Date   HGBA1C 5.3 03/27/2014   Urine Drug Screen:    Component Value Date/Time   LABOPIA NONE DETECTED 02/29/2016 0030   COCAINSCRNUR NONE DETECTED 02/29/2016 0030   COCAINSCRNUR NEG 01/16/2009 2229   LABBENZ POSITIVE (A) 02/29/2016 0030   LABBENZ NEG 01/16/2009 2229   AMPHETMU NONE DETECTED 02/29/2016 0030   THCU POSITIVE (A) 02/29/2016 0030   LABBARB NONE DETECTED 02/29/2016 0030      IMAGING I have personally reviewed the radiological images below and agree with the radiology interpretations.  Ct Head Code Stroke W/o Cm 02/28/2016 1. No acute intracranial abnormality. Slight worsening of bilateral frontal predominant white matter disease, likely chronic microvascular ischemia. 2. ASPECTS is 10.   Ct Angio Head W Or Wo Contrast Ct Angio Neck W Or Wo Contrast 02/28/2016 1. No intracranial arterial occlusion. 2. Moderate narrowing of the left MCA M2 segments. 3. Moderate to severe narrowing of the right PCA P2 segment. 4. Basilar artery dolichoectasia. 5. Bilateral pulmonary upper lobe consolidation, likely aspiration or pneumonia. 6. Normal CTA of the neck. 7. Aortic atherosclerosis.    Mr Brain Wo Contrast 03/03/2016 1. Large acute left MCA branch infarct. 2. Chronic white matter disease and susceptibility foci, with differential discussion above. 3. Arteriomegaly and vertebrobasilar tortuosity.   2D Echocardiogram  - Left ventricle: The cavity size was normal. Wall thickness was increased in a pattern of severe LVH. Systolic function was normal. The estimated ejection fraction was in the range of 55% to 60%. Wall motion was normal; there were no regional wall motion abnormalities. Features are consistent with a pseudonormal left ventricular filling pattern, with concomitant abnormal relaxation and increased filling pressure (grade 2 diastolic dysfunction). - Aortic valve: A bioprosthesis was present and functioning normally. Valve  area (VTI): 1.74 cm^2. Valve area (Vmax): 1.56 cm^2. Valve area (Vmean): 1.69 cm^2. - Aorta: The proximal aorta is dilated. In one view what appears to be a disseection flap is seen more distally . Suggest TEE if further eval needed. - Mitral valve: There was mild to moderate regurgitation. - Left atrium: The atrium was moderately dilated.  LE venous doppler - No evidence of deep vein thrombosis or baker's cysts bilaterally.   PHYSICAL EXAM  Temp:  [98.1 F (36.7 C)-99.4 F (37.4 C)] 99 F (37.2 C) (12/20 1456) Pulse Rate:  [66-78] 69 (12/20 1456) Resp:  [16-36] 16 (12/20 1456) BP: (133-218)/(69-107) 149/79 (12/20 1456) SpO2:  [93 %-100 %] 93 % (12/20 1456) Weight:  [155 lb 6.8 oz (70.5 kg)] 155 lb 6.8 oz (70.5 kg) (12/20 0537)  General - Well nourished, well developed, in no apparent distress.  Ophthalmologic - Fundi not visualized due to eye movement.  Cardiovascular - Regular rate and rhythm.  Mental Status -  Level of arousal and orientation to time, place, and person were intact. Language including expression, naming, repetition, comprehension was assessed and found intact. Fund of Knowledge was assessed and was intact.  Cranial Nerves II - XII - II - Visual field intact OU. III, IV, VI - Extraocular movements intact. V - Facial sensation intact bilaterally. VII - Facial movement intact bilaterally. VIII - Hearing & vestibular intact bilaterally. X - Palate elevates symmetrically. XI - Chin turning & shoulder shrug intact bilaterally. XII - Tongue protrusion intact.  Motor Strength - The patient's strength was normal in all extremities and pronator drift was absent.  Bulk was normal and fasciculations were absent.   Motor Tone - Muscle tone was assessed at the neck and appendages and was normal.  Reflexes - The patient's reflexes were 1+ in all extremities and he had no pathological reflexes.  Sensory - Light touch, temperature/pinprick were assessed and were  symmetrical.    Coordination - The patient had normal movements in the hands and feet with no ataxia or dysmetria.  Tremor was absent.  Gait and Station - deferred.   ASSESSMENT/PLAN Mr. [name] is a 45 y.o. 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 In ED, had seizure and subsequent cardiac arrest. CTA without clear LVO. Intubated and started on Keppra. Post extubation had mild right hemiparesis and word finding difficulty. MRI showed left brain embolic stroke. He was not considered for IV TPA due to seizure at onset with non-stroke presentation.  Stroke:  Moderate left MCA branch infarct, embolic secondary to unknown source. Concerning for paroxysmal afib   Resultant  Deficit resolved  Code stroke CT no acute abnormality   CT angiogram head and neck no LVO. Moderate narrowing left M2 and right P2.  MRI  moderate left MCA branch infarct  2D Echo  EF 55-60%. No source of embolus   Lower extremity Doppler negative for DVT  TEE pending   Consider loop recorder since pt has palpitation after surgery  LDL 124  HgbA1c pending  Heparin 5000 units sq tid for VTE prophylaxis  Diet NPO time specified  aspirin 81 mg daily prior to admission, now on aspirin 325 mg daily.   Patient counseled to be compliant with his antithrombotic medications  Ongoing aggressive stroke risk factor management  Therapy recommendations:  Home health PT  Disposition:  Pending  Seizure   Witness GTC in ED  EEG without epileptiform activity   No further seizure   Continue Keppra 500 mg twice a day  Hx of aortic dissection s/p repair    extensive surgical repair in 04/2014  Transient cardiomyopathy  Currently EF 55-60%   Hypertensive urgency  Initially treated with Cardene, now off  Stable now  On multiple BP  meds  Educated on BP meds compliance and monitoring at home  Long-term BP goal normotensive  Hyperlipidemia  Home meds:  Lipitor 20  LDL 124, goal < 70  Resumed statin   Continue statin at discharge  Tobacco abuse  Current smoker  Uses chewing tobacco  Smoking cessation counseling provided  Pt is willing to quit  Other Stroke Risk Factors  Marijuana use, urine drug screen positive for THC this admission, patient counseled of stroke risk and advised to stop use   Family hx stroke (maternal aunt)  Coronary  artery disease  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 After hours, contact General Neurology

## 2016-03-04 NOTE — Progress Notes (Signed)
CRITICAL VALUE ALERT  Critical value received:  Vanc Trough Level  Date of notification:  03/04/16  Time of notification:  1007  Critical value read back:Yes.    Nurse who received alert:  Montez Morita  MD notified (1st page):  Lajean Manes  Time of first page:  1730  MD notified (2nd page):  Time of second page:  Responding MD:   Time MD responded:    Pharmacist also notified. 1800 dose held.

## 2016-03-04 NOTE — Progress Notes (Signed)
OT Cancellation Note  Patient Details Name: [name] MRN: [id number] DOB: Mar 16, 1971   Cancelled Treatment:    Reason Eval/Treat Not Completed: Fatigue/lethargy limiting ability to participate.  Will reattempt tomorrow.  [place], OTR/L [phone]   [name] M 03/04/2016, 2:31 PM

## 2016-03-04 NOTE — Progress Notes (Signed)
Pt returned to room from Endoscopy. Pt drowsy at this time. VSS. Will continue to monitor pt throughout day. Pt now resting in bed at lowest position and call bell/phone is within reach.

## 2016-03-04 NOTE — Consult Note (Signed)
   [facility] Inpatient Consult   03/04/2016  [name] 13-Feb-1971 [id number]  Patient was admitted with a new onset CVA.  Spoke with inpatient RNCM regarding the possible Stroke EMMI program for follow up.  Patient will receive follow up calls on symptoms identified.  Went by the room to speak with the patient but he is currently off the unit.  [facility] will follow up with EMMI calls as needed.  No further needs identified. For questions, please contact:  [name], RN BSN [facility] Liaison  [phone] business mobile phone Toll free office [phone]

## 2016-03-04 NOTE — Progress Notes (Signed)
SLP Cancellation Note  Patient Details Name: [name] MRN: [id number] DOB: 11-10-1970   Cancelled treatment:       Reason Eval/Treat Not Completed: Patient at procedure or test/unavailable.Attempted x2.    Ladasha Schnackenberg, Katherene Ponto 03/04/2016, 1:52 PM

## 2016-03-04 NOTE — Progress Notes (Signed)
[facility] PROGRESS NOTE  [name] YIR:[id number] DOB: Oct 24, 1970 DOA: 02/28/2016  PCP: [name], MD   Subjective/Interval History: Patient feels okay, denies any complaints. TEE attempted, unsuccessful, per cardiology will be rescheduled with MAC sedation.  Brief History/Interval Summary: 45 year old male with a complicated cardiovascular history including aortic dissection s/p repair and reconstruction, systolic and diastolic HF, HTN who presented to the ED with confusion, R sided weakness, and changes in mental status as a code stroke. He had a stat head CT and had a witnessed seizure event which resolved spontaneously, although he was treated with ativan. Subsequently, he had depressed mental status and respiratory drive, and was intubated for airway protection. PCCM was called for admission. Patient was admitted to the intensive care unit. He was seen by neurology. Subsequently extubated. And then transferred to the floor.  Reason for Visit: Seizure. Acute encephalopathy  Consultants: Neurology  Procedures:  EEG "This EEG exhibits a pattern consistent with normal sedation. No electrographic seizures noted. Please, be aware that a normal EEG does not exclude the possibility of epilepsy."  ECHO Study Conclusions  - Left ventricle: The cavity size was normal. Wall thickness was   increased in a pattern of severe LVH. Systolic function was   normal. The estimated ejection fraction was in the range of 55%   to 60%. Wall motion was normal; there were no regional wall   motion abnormalities. Features are consistent with a pseudonormal   left ventricular filling pattern, with concomitant abnormal   relaxation and increased filling pressure (grade 2 diastolic   dysfunction). - Aortic valve: A bioprosthesis was present and functioning   normally. Valve area (VTI): 1.74 cm^2. Valve area (Vmax): 1.56   cm^2. Valve area (Vmean): 1.69 cm^2. - Aorta: The proximal aorta  is dilated. In one view what appears to   be a disseection flap is seen more distally . Suggest TEE if   further eval needed. - Mitral valve: There was mild to moderate regurgitation. - Left atrium: The atrium was moderately dilated.  Antibiotics: Vancomycin and Zosyn 12/16   ROS: Denies any chest pain but does admit to shortness of breath.  Objective:  Vital Signs  Vitals:   03/04/16 1050 03/04/16 1055 03/04/16 1110 03/04/16 1120  BP:  (!) 141/74 133/74 138/69  Pulse: 76 75 73 72  Resp: (!) 33 (!) 25 (!) 27 (!) 29  Temp:      TempSrc:  Oral    SpO2: 100% 96% 95% 94%  Weight:      Height:        Intake/Output Summary (Last 24 hours) at 03/04/16 1445 Last data filed at 03/04/16 0029  Gross per 24 hour  Intake                0 ml  Output              500 ml  Net             -500 ml   Filed Weights   03/02/16 0500 03/03/16 0455 03/04/16 0537  Weight: 73.6 kg (162 lb 4.1 oz) 72.8 kg (160 lb 7.9 oz) 70.5 kg (155 lb 6.8 oz)    General appearance: alert, cooperative, appears stated age and no distress Resp: Diminished air entry at the bases. No wheezing. No crackles. Cardio: regular rate and rhythm, S1, S2 normal, no murmur, click, rub or gallop GI: Abdomen is soft. Tenderness appreciated in the epigastric area without any rebound, rigidity or guarding. No  masses or organomegaly. Bowel sounds are present. Extremities: extremities normal, atraumatic, no cyanosis or edema Neurologic: No focal deficits  Lab Results:  Data Reviewed: I have personally reviewed following labs and imaging studies  CBC:  Recent Labs Lab 02/28/16 2007  02/29/16 0236 03/01/16 0856 03/02/16 0451 03/03/16 0455 03/03/16 1640 03/04/16 0512  WBC 6.3  --  7.3 11.2* 11.0* 7.0  --  5.9  NEUTROABS 2.4  --   --   --   --   --   --   --   HGB 13.4  < > 14.3 11.6* 11.5* 11.7*  --  11.4*  HCT 40.3  < > 43.3 35.8* 34.2* 35.1*  --  34.8*  MCV 92.4  --  92.3 94.7 92.7 92.1  --  91.8  PLT 115*  --   97* 77* 89* 80* 92* 89*  < > = values in this interval not displayed.  Basic Metabolic Panel:  Recent Labs Lab 02/29/16 0236 02/29/16 1049 02/29/16 1648 03/01/16 0856 03/02/16 0451 03/03/16 0455 03/04/16 0512  NA 138  --   --  139 138 139 136  K 3.6  --   --  3.9 3.9 3.5 3.0*  CL 108  --   --  112* 110 109 108  CO2 19*  --   --  22 21* 23 23  GLUCOSE 92  --   --  99 94 129* 111*  BUN 13  --   --  20 13 10 11   CREATININE 1.56*  --   --  2.06* 1.95* 1.69* 1.77*  CALCIUM 8.7*  --   --  7.9* 8.2* 8.6* 8.5*  MG 2.0 2.1 2.0 2.2 1.9 2.0  --   PHOS 3.3 2.9 4.1 3.3 3.0 3.3  --     GFR: Estimated Creatinine Clearance: 53.7 mL/min (by C-G formula based on SCr of 1.77 mg/dL (H)).  Liver Function Tests:  Recent Labs Lab 02/28/16 2007 03/03/16 0455 03/04/16 0512  AST 22 29 31   ALT 21 19 23   ALKPHOS 97 64 62  BILITOT 0.8 1.1 0.8  PROT 6.8 5.9* 6.2*  ALBUMIN 3.4* 2.7* 2.6*    Coagulation Profile:  Recent Labs Lab 02/28/16 2007 03/03/16 1640 03/04/16 0512  INR 1.08 1.01 1.07    CBG:  Recent Labs Lab 02/29/16 1940 02/29/16 2332 03/01/16 0332 03/01/16 0805 03/01/16 1211  GLUCAP 94 99 110* 116* 92     Recent Results (from the past 240 hour(s))  Culture, respiratory (NON-Expectorated)     Status: None   Collection Time: 02/29/16  3:54 AM  Result Value Ref Range Status   Specimen Description TRACHEAL ASPIRATE  Final   Special Requests NONE  Final   Gram Stain   Final    ABUNDANT WBC PRESENT, PREDOMINANTLY PMN MODERATE GRAM POSITIVE COCCI IN PAIRS RARE GRAM NEGATIVE COCCI IN PAIRS    Culture Consistent with normal respiratory flora.  Final   Report Status 03/02/2016 FINAL  Final  Culture, blood (Routine X 2) w Reflex to ID Panel     Status: None (Preliminary result)   Collection Time: 02/29/16  4:42 AM  Result Value Ref Range Status   Specimen Description BLOOD LEFT ANTECUBITAL  Final   Special Requests IN PEDIATRIC BOTTLE 3CC  Final   Culture NO GROWTH 4  DAYS  Final   Report Status PENDING  Incomplete  Culture, blood (Routine X 2) w Reflex to ID Panel     Status: None (Preliminary result)  Collection Time: 02/29/16  4:55 AM  Result Value Ref Range Status   Specimen Description BLOOD LEFT ANTECUBITAL  Final   Special Requests IN PEDIATRIC BOTTLE 3CC  Final   Culture NO GROWTH 4 DAYS  Final   Report Status PENDING  Incomplete  MRSA PCR Screening     Status: None   Collection Time: 02/29/16  5:17 AM  Result Value Ref Range Status   MRSA by PCR NEGATIVE NEGATIVE Final    Comment:        The GeneXpert MRSA Assay (FDA approved for NASAL specimens only), is one component of a comprehensive MRSA colonization surveillance program. It is not intended to diagnose MRSA infection nor to guide or monitor treatment for MRSA infections.   Culture, Urine     Status: None   Collection Time: 02/29/16  5:44 AM  Result Value Ref Range Status   Specimen Description URINE, RANDOM  Final   Special Requests NONE  Final   Culture NO GROWTH  Final   Report Status 03/01/2016 FINAL  Final      Radiology Studies: Ct Abdomen Pelvis Wo Contrast  Result Date: 03/04/2016 CLINICAL DATA:  Abdominal pain tonight. EXAM: CT ABDOMEN AND PELVIS WITHOUT CONTRAST TECHNIQUE: Multidetector CT imaging of the abdomen and pelvis was performed following the standard protocol without IV contrast. COMPARISON:  03/21/2015 FINDINGS: Lower chest: Descending thoracic aortic dissection with endovascular stent graft, appearing grossly unchanged. The adjacent left lung base atelectasis. No acute findings are evident in the lower chest. Hepatobiliary: Cholelithiasis. No focal liver lesion is evident on unenhanced scanning. No bile duct dilatation. Pancreas: Unremarkable unenhanced appearances of the pancreas. No inflammatory changes or duct dilatation. Spleen: Normal in size without focal abnormality. Adrenals/Urinary Tract: No urinary calculi. No hydronephrosis. No focal parenchymal  lesions are evident. Urinary bladder is unremarkable. Adrenals are unremarkable. Stomach/Bowel: Stomach is within normal limits. Appendix is normal. No evidence of bowel wall thickening, distention, or inflammatory changes. Vascular/Lymphatic: Upper abdominal aortic aneurysm and chronic dissection. Aortic atherosclerosis. Bilateral common iliac artery aneurysms. No retroperitoneal hematoma, but cannot assess for new dissection or luminal patency in the absence of intravenous contrast. No pathologic adenopathy is evident in the abdomen or pelvis. Reproductive: Unremarkable Other: Bland-appearing air bubbles in the subcutaneous tissues of the anterior abdominal wall likely represent injection sites. No drainable collections. No acute inflammatory changes are evident in the abdomen or pelvis. No ascites. The benign-appearing low-attenuation right inguinal lesion continues to decrease in size, now measuring 2.9 cm in longest axis compared to 4 cm on 03/21/2015. Musculoskeletal: No significant skeletal lesions. IMPRESSION: 1. Extensive thoracic and abdominal aortic dissection with endoluminal stent graft. No conclusive interval changes, but evaluation is limited in the absence of intravenous contrast. 2. No acute inflammatory changes in the abdomen or pelvis. No bowel obstruction or perforation. No acute urologic abnormality. 3. Cholelithiasis. Electronically Signed   By: Andreas Newport M.D.   On: 03/04/2016 01:06   Mr Brain Wo Contrast  Result Date: 03/03/2016 CLINICAL DATA:  Seizure. EXAM: MRI HEAD WITHOUT CONTRAST TECHNIQUE: Multiplanar, multiecho pulse sequences of the brain and surrounding structures were obtained without intravenous contrast. COMPARISON:  CTA head neck 4 days ago. FINDINGS: Brain: Large area of confluent restricted diffusion that is cortically based and extends from the posterior insula to the high frontal parietal junction. This is in superior division left MCA territory. No superimposed  hemorrhage. No acute infarct elsewhere. Patchy FLAIR hyperintensity in the cerebral white matter, likely chronic microvascular ischemic gliosis. There  are numerous foci of susceptibility artifact in the deep gray nuclei, brainstem, and bilateral cerebellum. There is also peripheral (cortical or sulcal) susceptibility foci. Usually this is from chronic hypertension. Given the patient's vascular disease and previous surgery these could also be from primary vasculopathy or metallic emboli. Negative for mass or hydrocephalus. Symmetric normal hippocampal formation appearance. Vascular: Arteriomegaly and vertebrobasilar tortuosity. Skull and upper cervical spine: Negative Sinuses/Orbits: Mucous retention cysts in the maxillary antra and adenoid. IMPRESSION: 1. Large acute left MCA branch infarct. 2. Chronic white matter disease and susceptibility foci, with differential discussion above. 3. Arteriomegaly and vertebrobasilar tortuosity. Electronically Signed   By: Monte Fantasia M.D.   On: 03/03/2016 13:57     Medications:  Scheduled: .  stroke: mapping our early stages of recovery book   Does not apply Once  . amLODipine  10 mg Oral Daily  . aspirin EC  325 mg Oral Daily  . cloNIDine  0.2 mg Oral TID  . famotidine  20 mg Oral QHS  . feeding supplement (ENSURE ENLIVE)  237 mL Oral BID BM  . heparin  5,000 Units Subcutaneous Q8H  . hydrALAZINE  50 mg Oral BID  . labetalol  300 mg Oral BID  . levETIRAcetam  500 mg Intravenous Q12H  . piperacillin-tazobactam (ZOSYN)  IV  3.375 g Intravenous Q8H  . pneumococcal 23 valent vaccine  0.5 mL Intramuscular Tomorrow-1000  . vancomycin  1,000 mg Intravenous Q12H   Continuous: . sodium chloride 250 mL (03/02/16 2000)   MLJ:[id number] **OR** acetaminophen  Assessment/Plan:  Active Problems:   Seizure (HCC)   Malnutrition of moderate degree   CVA (cerebral vascular accident) ([place])     Acute CVA -Large acute left MCA territory CVA. -Nephrology  consulted, recommended TEE because of history of thoracic aortic aneurysm and endoluminal graft stent. -Currently on aspirin, await final recommendation from cardiology and neurology. -TE [facility] earlier today, unable to do it for inadequate sedation, to be scheduled with MAC.  Acute encephalopathy. Etiology is unclear. Neurology is following. MRI is pending. May need further neurological workup including CSF analysis.  Seizure Patient had a single episode of seizure. Hasn't had any further episodes since initial presentation. EEG did not show any epileptiform activity. He is however on Keppra at this time. Neurology is following.  HTN emergency. Patient had to be initially placed on a Cardene infusion. Currently off of it. Continue antihypertensives. Blood pressure is not optimally controlled at this time. May need further adjustment. Patient also on labetalol at home, which has not been initiated yet. Will resume at a lower dose. Monitor Heart rate closely.  Hx of CAD, diastolic CHF. Appears to be stable. Echocardiogram shows normal systolic function. Grade 2 diastolic dysfunction is noted. Appears to be well compensated at this time.  Thrombocytopenia Etiology is unclear. Could be due to acute illness. Platelet count is stable. Continue to monitor. There is no evidence for bleeding.  Normocytic anemia.  Hemoglobin stable. No evidence of bleeding.  History of aortic dissection Patient is status post aortic valve replacement and graft placement at [place] last year. Echocardiogram does show a distal flap. This was seen previously as well on a CT scan and will be considered a stable finding.  Aspiration pneumonia. Cultures are negative so far. Patient is on broad-spectrum antibiotics.  Continue antibiotics for total 7-10 days.  Acute on CKD 3 Renal function has been improving. At baseline, continue monitoring.  Polysubstance abuse -UDS is positive for benzos and THC patient  counseled. -  Reported remote heavy alcohol drinking.  DVT Prophylaxis: Subcutaneous heparin    Code Status: Full code  Family Communication: Discussed with the patient  Disposition Plan: Management as outlined above. PT evaluation.    LOS: 5 days   [facility]  [facility] Pager [phone] 03/04/2016, 2:45 PM  If 7PM-7AM, please contact night-coverage at [email or web address], password [facility]

## 2016-03-04 NOTE — Progress Notes (Signed)
Pharmacy Antibiotic Note [name] is a 45 y.o. male admitted on 02/28/2016 with seizures that is currently on day 5 of empiric Zosyn and vancomycin for coverage of sepsis.  Vancomycin trough this evening is higher than desired at 25. SCr stable and culture data remains negative.    Plan: 1. Zosyn 3.375g IV q8h (4 hour infusion) 2. Adjust vancomycin to 750 mg IV every 12 hours 3. Consider stopping vancomycin with negative PCR and aspiration pneumonia main concern  Height: 5\' 9"  (175.3 cm) Weight: 155 lb 6.8 oz (70.5 kg) IBW/kg (Calculated) : 70.7  Temp (24hrs), Avg:98.6 F (37 C), Min:98.1 F (36.7 C), Max:99.4 F (37.4 C)   Recent Labs Lab 02/29/16 0236 02/29/16 0448 02/29/16 1049 03/01/16 0856 03/02/16 0451 03/03/16 0455 03/04/16 0512 03/04/16 1540  WBC 7.3  --   --  11.2* 11.0* 7.0 5.9  --   CREATININE 1.56*  --   --  2.06* 1.95* 1.69* 1.77*  --   LATICACIDVEN  --  2.6* 2.2*  --   --   --   --   --   VANCOTROUGH  --   --   --   --   --   --   --  25*    Estimated Creatinine Clearance: 53.7 mL/min (by C-G formula based on SCr of 1.77 mg/dL (H)).    Allergies  Allergen Reactions  . Imdur [Isosorbide Nitrate] Other (See Comments)    headache  . Tramadol Other (See Comments)    "headaches" per pt    Antimicrobials this admission: 12/16 vancomycin > 12/16 zosyn >  Dose adjustments this admission: 12/20: vancomycin adjusted to 750 mg IV every 12 hours   Microbiology results: 12/16 blood cx: ngtd 12/16 resp cx:  normal flora  12/16 mrsa: neg 12/16 UCx: ngF  Thank you for allowing pharmacy to be a part of this patient's care.  Vincenza Hews, PharmD, BCPS 03/04/2016, 5:41 PM Pager: [phone]

## 2016-03-04 NOTE — H&P (Signed)
    INTERVAL PROCEDURE H&P  History and Physical Interval Note:  03/04/2016 8:50 AM  [name] has presented today for their planned procedure. The various methods of treatment have been discussed with the patient and family. After consideration of risks, benefits and other options for treatment, the patient has consented to the procedure.  The patients' outpatient history has been reviewed, patient examined, and no change in status from most recent office note within the past 30 days. I have reviewed the patients' chart and labs and will proceed as planned. Questions were answered to the patient's satisfaction.   Pixie Casino, MD, [facility] Attending Cardiologist [place] 03/04/2016, 8:50 AM

## 2016-03-04 NOTE — Progress Notes (Signed)
*  PRELIMINARY RESULTS* Vascular Ultrasound Bilateral lower extremity venous duplex has been completed.  Preliminary findings: No evidence of deep vein thrombosis or baker's cysts bilaterally.   Everrett Coombe 03/04/2016, 2:15 PM

## 2016-03-05 DIAGNOSIS — R002 Palpitations: Secondary | ICD-10-CM

## 2016-03-05 LAB — CULTURE, BLOOD (ROUTINE X 2)
CULTURE: NO GROWTH
CULTURE: NO GROWTH

## 2016-03-05 LAB — HEMOGLOBIN A1C
Hgb A1c MFr Bld: 5.3 % (ref 4.8–5.6)
Mean Plasma Glucose: 105 mg/dL

## 2016-03-05 MED ORDER — CLOPIDOGREL BISULFATE 75 MG PO TABS
75.0000 mg | ORAL_TABLET | Freq: Every day | ORAL | Status: DC
  Administered 2016-03-06: 75 mg via ORAL
  Filled 2016-03-05: qty 1

## 2016-03-05 MED ORDER — AMOXICILLIN-POT CLAVULANATE 875-125 MG PO TABS
1.0000 | ORAL_TABLET | Freq: Two times a day (BID) | ORAL | Status: DC
  Administered 2016-03-05 – 2016-03-06 (×3): 1 via ORAL
  Filled 2016-03-05 (×3): qty 1

## 2016-03-05 MED ORDER — BUTALBITAL-APAP-CAFFEINE 50-325-40 MG PO TABS
1.0000 | ORAL_TABLET | Freq: Two times a day (BID) | ORAL | Status: DC | PRN
  Administered 2016-03-05 (×2): 1 via ORAL
  Filled 2016-03-05 (×2): qty 1

## 2016-03-05 MED ORDER — MORPHINE SULFATE (PF) 2 MG/ML IV SOLN
1.0000 mg | Freq: Once | INTRAVENOUS | Status: AC
  Administered 2016-03-05: 1 mg via INTRAVENOUS
  Filled 2016-03-05: qty 1

## 2016-03-05 NOTE — Progress Notes (Signed)
   03/05/16 1506  Modified Rankin (Stroke Patients Only)  Pre-Morbid Rankin Score 0  Modified Rankin 3  Governor [place], [place] pager [phone]

## 2016-03-05 NOTE — Consult Note (Signed)
   [facility] Inpatient Consult   03/05/2016  [name] 24-Oct-1970 [id number]   Follow up:   Patient was given a brochure and contact information for [facility].  Noted that he is being recommended for Outpatient rehab per PT note today.  He will have EMMI stroke calls for follow up.  Patient verbalized understanding.  [name], RN BSN [facility] Liaison  [phone] business mobile phone Toll free office [phone]

## 2016-03-05 NOTE — Progress Notes (Signed)
[facility] PROGRESS NOTE  [name] SPQ:[id number] DOB: 01-17-1971 DOA: 02/28/2016  PCP: [name], MD   Subjective/Interval History: Patient is complaining about severe headache this morning not improving with Tylenol and Vicodin. Received 1 dose of morphine, discussed with neurology.  Brief History/Interval Summary: 45 year old male with a complicated cardiovascular history including aortic dissection s/p repair and reconstruction, systolic and diastolic HF, HTN who presented to the ED with confusion, R sided weakness, and changes in mental status as a code stroke. He had a stat head CT and had a witnessed seizure event which resolved spontaneously, although he was treated with ativan. Subsequently, he had depressed mental status and respiratory drive, and was intubated for airway protection. PCCM was called for admission. Patient was admitted to the intensive care unit. He was seen by neurology. Subsequently extubated. And then transferred to the floor.  Reason for Visit: Seizure. Acute encephalopathy  Consultants: Neurology  Procedures:  EEG "This EEG exhibits a pattern consistent with normal sedation. No electrographic seizures noted. Please, be aware that a normal EEG does not exclude the possibility of epilepsy."  ECHO Study Conclusions  - Left ventricle: The cavity size was normal. Wall thickness was   increased in a pattern of severe LVH. Systolic function was   normal. The estimated ejection fraction was in the range of 55%   to 60%. Wall motion was normal; there were no regional wall   motion abnormalities. Features are consistent with a pseudonormal   left ventricular filling pattern, with concomitant abnormal   relaxation and increased filling pressure (grade 2 diastolic   dysfunction). - Aortic valve: A bioprosthesis was present and functioning   normally. Valve area (VTI): 1.74 cm^2. Valve area (Vmax): 1.56   cm^2. Valve area (Vmean): 1.69 cm^2. -  Aorta: The proximal aorta is dilated. In one view what appears to   be a disseection flap is seen more distally . Suggest TEE if   further eval needed. - Mitral valve: There was mild to moderate regurgitation. - Left atrium: The atrium was moderately dilated.  Antibiotics: Vancomycin and Zosyn 12/16   ROS: Denies any chest pain but does admit to shortness of breath.  Objective:  Vital Signs  Vitals:   03/05/16 0215 03/05/16 0509 03/05/16 1055 03/05/16 1408  BP: 123/78 (!) 146/81 (!) 149/89 125/69  Pulse: 64 78 72 71  Resp: 18 18 15 17   Temp: 98 F (36.7 C) 98.5 F (36.9 C) 98.2 F (36.8 C) 98.1 F (36.7 C)  TempSrc: Oral Oral Oral Oral  SpO2: 93% 97% 96% 95%  Weight:  71.2 kg (157 lb)    Height:        Intake/Output Summary (Last 24 hours) at 03/05/16 1452 Last data filed at 03/05/16 1300  Gross per 24 hour  Intake           1967.5 ml  Output             1550 ml  Net            417.5 ml   Filed Weights   03/03/16 0455 03/04/16 0537 03/05/16 0509  Weight: 72.8 kg (160 lb 7.9 oz) 70.5 kg (155 lb 6.8 oz) 71.2 kg (157 lb)    General appearance: alert, cooperative, appears stated age and no distress Resp: Diminished air entry at the bases. No wheezing. No crackles. Cardio: regular rate and rhythm, S1, S2 normal, no murmur, click, rub or gallop GI: Abdomen is soft. Tenderness appreciated in the epigastric  area without any rebound, rigidity or guarding. No masses or organomegaly. Bowel sounds are present. Extremities: extremities normal, atraumatic, no cyanosis or edema Neurologic: No focal deficits  Lab Results:  Data Reviewed: I have personally reviewed following labs and imaging studies  CBC:  Recent Labs Lab 02/28/16 2007  02/29/16 0236 03/01/16 0856 03/02/16 0451 03/03/16 0455 03/03/16 1640 03/04/16 0512  WBC 6.3  --  7.3 11.2* 11.0* 7.0  --  5.9  NEUTROABS 2.4  --   --   --   --   --   --   --   HGB 13.4  < > 14.3 11.6* 11.5* 11.7*  --  11.4*  HCT  40.3  < > 43.3 35.8* 34.2* 35.1*  --  34.8*  MCV 92.4  --  92.3 94.7 92.7 92.1  --  91.8  PLT 115*  --  97* 77* 89* 80* 92* 89*  < > = values in this interval not displayed.  Basic Metabolic Panel:  Recent Labs Lab 02/29/16 0236 02/29/16 1049 02/29/16 1648 03/01/16 0856 03/02/16 0451 03/03/16 0455 03/04/16 0512  NA 138  --   --  139 138 139 136  K 3.6  --   --  3.9 3.9 3.5 3.0*  CL 108  --   --  112* 110 109 108  CO2 19*  --   --  22 21* 23 23  GLUCOSE 92  --   --  99 94 129* 111*  BUN 13  --   --  20 13 10 11   CREATININE 1.56*  --   --  2.06* 1.95* 1.69* 1.77*  CALCIUM 8.7*  --   --  7.9* 8.2* 8.6* 8.5*  MG 2.0 2.1 2.0 2.2 1.9 2.0  --   PHOS 3.3 2.9 4.1 3.3 3.0 3.3  --     GFR: Estimated Creatinine Clearance: 53.8 mL/min (by C-G formula based on SCr of 1.77 mg/dL (H)).  Liver Function Tests:  Recent Labs Lab 02/28/16 2007 03/03/16 0455 03/04/16 0512  AST 22 29 31   ALT 21 19 23   ALKPHOS 97 64 62  BILITOT 0.8 1.1 0.8  PROT 6.8 5.9* 6.2*  ALBUMIN 3.4* 2.7* 2.6*    Coagulation Profile:  Recent Labs Lab 02/28/16 2007 03/03/16 1640 03/04/16 0512  INR 1.08 1.01 1.07    CBG:  Recent Labs Lab 02/29/16 1940 02/29/16 2332 03/01/16 0332 03/01/16 0805 03/01/16 1211  GLUCAP 94 99 110* 116* 92     Recent Results (from the past 240 hour(s))  Culture, respiratory (NON-Expectorated)     Status: None   Collection Time: 02/29/16  3:54 AM  Result Value Ref Range Status   Specimen Description TRACHEAL ASPIRATE  Final   Special Requests NONE  Final   Gram Stain   Final    ABUNDANT WBC PRESENT, PREDOMINANTLY PMN MODERATE GRAM POSITIVE COCCI IN PAIRS RARE GRAM NEGATIVE COCCI IN PAIRS    Culture Consistent with normal respiratory flora.  Final   Report Status 03/02/2016 FINAL  Final  Culture, blood (Routine X 2) w Reflex to ID Panel     Status: None (Preliminary result)   Collection Time: 02/29/16  4:42 AM  Result Value Ref Range Status   Specimen  Description BLOOD LEFT ANTECUBITAL  Final   Special Requests IN PEDIATRIC BOTTLE 3CC  Final   Culture NO GROWTH 4 DAYS  Final   Report Status PENDING  Incomplete  Culture, blood (Routine X 2) w Reflex to ID Panel  Status: None (Preliminary result)   Collection Time: 02/29/16  4:55 AM  Result Value Ref Range Status   Specimen Description BLOOD LEFT ANTECUBITAL  Final   Special Requests IN PEDIATRIC BOTTLE 3CC  Final   Culture NO GROWTH 4 DAYS  Final   Report Status PENDING  Incomplete  MRSA PCR Screening     Status: None   Collection Time: 02/29/16  5:17 AM  Result Value Ref Range Status   MRSA by PCR NEGATIVE NEGATIVE Final    Comment:        The GeneXpert MRSA Assay (FDA approved for NASAL specimens only), is one component of a comprehensive MRSA colonization surveillance program. It is not intended to diagnose MRSA infection nor to guide or monitor treatment for MRSA infections.   Culture, Urine     Status: None   Collection Time: 02/29/16  5:44 AM  Result Value Ref Range Status   Specimen Description URINE, RANDOM  Final   Special Requests NONE  Final   Culture NO GROWTH  Final   Report Status 03/01/2016 FINAL  Final      Radiology Studies: Ct Abdomen Pelvis Wo Contrast  Result Date: 03/04/2016 CLINICAL DATA:  Abdominal pain tonight. EXAM: CT ABDOMEN AND PELVIS WITHOUT CONTRAST TECHNIQUE: Multidetector CT imaging of the abdomen and pelvis was performed following the standard protocol without IV contrast. COMPARISON:  03/21/2015 FINDINGS: Lower chest: Descending thoracic aortic dissection with endovascular stent graft, appearing grossly unchanged. The adjacent left lung base atelectasis. No acute findings are evident in the lower chest. Hepatobiliary: Cholelithiasis. No focal liver lesion is evident on unenhanced scanning. No bile duct dilatation. Pancreas: Unremarkable unenhanced appearances of the pancreas. No inflammatory changes or duct dilatation. Spleen: Normal in  size without focal abnormality. Adrenals/Urinary Tract: No urinary calculi. No hydronephrosis. No focal parenchymal lesions are evident. Urinary bladder is unremarkable. Adrenals are unremarkable. Stomach/Bowel: Stomach is within normal limits. Appendix is normal. No evidence of bowel wall thickening, distention, or inflammatory changes. Vascular/Lymphatic: Upper abdominal aortic aneurysm and chronic dissection. Aortic atherosclerosis. Bilateral common iliac artery aneurysms. No retroperitoneal hematoma, but cannot assess for new dissection or luminal patency in the absence of intravenous contrast. No pathologic adenopathy is evident in the abdomen or pelvis. Reproductive: Unremarkable Other: Bland-appearing air bubbles in the subcutaneous tissues of the anterior abdominal wall likely represent injection sites. No drainable collections. No acute inflammatory changes are evident in the abdomen or pelvis. No ascites. The benign-appearing low-attenuation right inguinal lesion continues to decrease in size, now measuring 2.9 cm in longest axis compared to 4 cm on 03/21/2015. Musculoskeletal: No significant skeletal lesions. IMPRESSION: 1. Extensive thoracic and abdominal aortic dissection with endoluminal stent graft. No conclusive interval changes, but evaluation is limited in the absence of intravenous contrast. 2. No acute inflammatory changes in the abdomen or pelvis. No bowel obstruction or perforation. No acute urologic abnormality. 3. Cholelithiasis. Electronically Signed   By: Andreas Newport M.D.   On: 03/04/2016 01:06     Medications:  Scheduled: .  stroke: mapping our early stages of recovery book   Does not apply Once  . amLODipine  10 mg Oral Daily  . amoxicillin-clavulanate  1 tablet Oral Q12H  . aspirin EC  325 mg Oral Daily  . atorvastatin  20 mg Oral Daily  . cloNIDine  0.2 mg Oral TID  . famotidine  20 mg Oral QHS  . feeding supplement (ENSURE ENLIVE)  237 mL Oral BID BM  . heparin   5,000 Units  Subcutaneous Q8H  . hydrALAZINE  50 mg Oral BID  . labetalol  300 mg Oral BID  . levETIRAcetam  500 mg Intravenous Q12H  . pneumococcal 23 valent vaccine  0.5 mL Intramuscular Tomorrow-1000   Continuous: . sodium chloride 250 mL (03/05/16 1253)   RSW:[id number] **OR** acetaminophen, butalbital-acetaminophen-caffeine  Assessment/Plan:  Active Problems:   Seizure (HCC)   Malnutrition of moderate degree   CVA (cerebral vascular accident) ([place])   Palpitations     Acute CVA -Large acute left MCA territory CVA. -Nephrology consulted, recommended TEE because of history of thoracic aortic aneurysm and endoluminal graft stent. -Currently on aspirin, await final recommendation from cardiology and neurology. -TEE rescheduled with MAC sedation tomorrow. Neurology recommended implantable loop recorder.  Acute encephalopathy. -This is resolved, this is likely secondary to stroke.  Seizure Patient had a single episode of seizure. Hasn't had any further episodes since initial presentation. EEG did not show any epileptiform activity. He is however on Keppra at this time. Neurology is following.  HTN emergency. Patient had to be initially placed on a Cardene infusion. Currently off of it. Continue antihypertensives.  [place] tensive restarted, blood pressure is better controlled now.  Hx of CAD, diastolic CHF. Appears to be stable. Echocardiogram shows normal systolic function. Grade 2 diastolic dysfunction is noted. Appears to be well compensated at this time.  Thrombocytopenia Etiology is unclear. Could be due to acute illness. Platelet count is stable. Continue to monitor. There is no evidence for bleeding.  Normocytic anemia.  Hemoglobin stable. No evidence of bleeding.  History of aortic dissection Patient is status post aortic valve replacement and graft placement at [facility] last year. Echocardiogram does show a distal flap. This was seen previously as well on a  CT scan and will be considered a stable finding.  Aspiration pneumonia. Cultures are negative so far. Patient is on broad-spectrum antibiotics.  Continue antibiotics for total 7-10 days.  Acute on CKD 3 Renal function has been improving. At baseline, continue monitoring. Likely he will benefit from ACEI or ARB after his creatinine plateaus.  Polysubstance abuse -UDS is positive for benzos and THC patient counseled. -Reported remote heavy alcohol drinking.  DVT Prophylaxis: Subcutaneous heparin    Code Status: Full code  Family Communication: Discussed with the patient  Disposition Plan: Management as outlined above. PT evaluation.    LOS: 6 days   [facility] Pager [phone] 03/05/2016, 2:52 PM  If 7PM-7AM, please contact night-coverage at [email or web address], password [facility]

## 2016-03-05 NOTE — Evaluation (Signed)
Speech Language Pathology Evaluation Patient Details Name: [name] MRN: [id number] DOB: 09-04-1970 Today's Date: 03/05/2016 Time: 3536-1443 SLP Time Calculation (min) (ACUTE ONLY): 29 min  Problem List:  Patient Active Problem List   Diagnosis Date Noted  . Palpitations   . CVA (cerebral vascular accident) ([place])   . Malnutrition of moderate degree 02/29/2016  . Seizure ([place]) 02/28/2016  . HLD (hyperlipidemia) 08/28/2015  . Thrombosed hemorrhoids 06/14/2015  . Rectal bleeding 06/14/2015  . Rectal pain 06/14/2015  . 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[place]) 06/07/2015  . Cardiomyopathy ([place]) 03/01/2015  . Acid reflux 03/01/2015  . Essential hypertension 03/01/2015  . Cardiac chest pain 12/10/2014  . Abdominal pain 10/12/2014  . Injury of kidney 10/03/2014  . S/P aortic dissection repair 10/03/2014  . H/O aortic valve replacement 10/03/2014  . Pre-op testing 06/15/2014  . CKD (chronic kidney disease) 05/15/2014  . Hypoxia   . AKI (acute kidney injury) ([place])   . Aortic dissection, thoracoabdominal ([place])   . 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 Aortic dissection, thoracic ([place]) 04/24/2014  . Aortic dissection ([place]) 04/23/2014  . Aneurysm of ascending aorta (HCC) 03/28/2014  . Dyspnea 03/27/2014  . Prolonged Q-T interval on ECG 03/27/2014  . ETOH abuse 09/20/2013  . Hypertensive crisis 08/08/2013  . Sinusitis, bacterial 08/08/2013  . Malignant hypertension 02/20/2013  . Hypertensive urgency 01/27/2012  . Cardiomyopathy, hypertensive ([place]) 01/26/2012  . AI (aortic insufficiency) 01/26/2012  . MR (mitral regurgitation) 01/26/2012  . Acute renal failure superimposed on stage 3 chronic kidney disease ([place]) 01/26/2012  . Systolic CHF, chronic ([place]) 06/10/2011  . Chronic bronchitis 05/23/2011  . Hypertensive emergency 05/22/2011  . Cardiomegaly - hypertensive 05/22/2011  . Tobacco abuse 05/22/2011  . Marijuana use 05/22/2011  .  Abdominal pain 05/22/2011  . Hypertension, malignant 05/22/2011  . Anxiety and depression 05/22/2011  . Hyperlipidemia 08/26/2009  . Tobacco use 08/26/2009  . Headache(784.0) 08/26/2009  . Aortic valve disorder 03/11/2009  . INGUINAL HERNIA 02/18/2009  . Uncontrolled hypertension 01/16/2009  . MURMUR 01/16/2009   Past Medical History:  Past Medical History:  Diagnosis Date  . Adenomatous colon polyp 08/2015  . Anxiety   . Aortic disease ([place])   . Aortic dissection (HCC)    a. admx 04/2014 >> L renal infarct; a/c renal failure >> b.  s/p Bioprosthetic [place] and total arch replacement and staged endovascular repair of descending aortic aneurysm ([facility] - Dr. [name])  . CAD (coronary artery disease)    a. LHC 4/16:  oD1 60%  . Cardiomyopathy ([place])    a. non-ischemic - probably related to untreated HTN and ETOH abuse - Echo 3/13 with EF 35-40% >> b. Echo 4/16: Severe LVH, EF 55-60%, moderate AI, moderate MR, mild LAE, trivial effusion, known type B dissection with communication between true and false lumens with suprasternal images suggesting dissection plane may propagate to at least left subclavian takeoff, root above aortic valve okay    . Chronic abdominal pain   . Chronic combined systolic and diastolic congestive heart failure ([place])    a. 05/2011: Adm with pulm edema/HTN urgency, EF 35-40% with diffuse hypokinesis and moderate to severe mitral regurgitation. Cardiomyopathy likely due to uncontrolled HTN and ETOH abuse - cath deferred due to renal insufficiency (felt due to uncontrolled HTN). bJodie Echevaria MV 06/2011: EF 37% and no ischemia or infarction. c. EF 45-50% by echo 01/2012.  [name] Chronic sinusitis   . CKD (chronic kidney disease)    a.  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  peak creatinine 3.46 during admx for aortic dissection 2/16.  sees Dr [name]  . Colon polyps 09/11/2015   Descending, sigmoid polyps  . DDD (degenerative disc disease), lumbar   . Descending thoracic aortic aneurysm  ([place])   . Dissecting aneurysm of thoracic aorta ([place])   . ETOH abuse    a. Reported to have quit 05/2011.  [name] GERD (gastroesophageal reflux disease)   . Headache(784.0)    "q other day" (08/08/2013)  . Heart murmur   . Hemorrhoid thrombosis   . History of echocardiogram    Echo 1/17:  Severe LVH, EF 55-60%, no RWMA, Gr 2 DD, AVR ok, mild to mod MR, mild LAE, mild reduced RVSF, mod RAE  . History of medication noncompliance   . HYPERLIPIDEMIA   . Hypertension    a. Hx of HTN urgency secondary to noncompliance. b. urinary metanephrine and catecholeamine levels normal 2013.  c. Renal art [name] 1/16:  No evidence of renal artery stenosis noted bilaterally.  . INGUINAL HERNIA   . Pneumonia ~ 2013  . Renal insufficiency   . Tobacco abuse   . Valvular heart disease    a. Echo 05/2011: moderate to severe eccentric MR and mild to moderate AI with prolapsing left coronary cusp. b. Echo 01/2012: mild-mod AI, mild dilitation of aortic root, mild MR.;  c. Echo 1/16: Severe LVH consistent with hypertrophic cardio myopathy, EF 50%, no RWMA, mod AI, mild MR, mild RAE, dilated Ao root (40 mm);    Past Surgical History:  Past Surgical History:  Procedure Laterality Date  . ANKLE SURGERY Bilateral    Fractures bilaterally  . AORTIC VALVE SURGERY  09/2014  . FOOT FRACTURE SURGERY Bilateral 2004-2010   "got pins in both of them"  . HEMORRHOID SURGERY N/A 06/15/2015   Procedure: HEMORRHOIDECTOMY;  Surgeon: Stark Klein, MD;  Location: [facility];  Service: General;  Laterality: N/A;  . INGUINAL HERNIA REPAIR Right ~ 1996  . LEFT HEART CATHETERIZATION WITH CORONARY ANGIOGRAM N/A 06/21/2014   Procedure: LEFT HEART CATHETERIZATION WITH CORONARY ANGIOGRAM;  Surgeon: Larey Dresser, MD;  Location: [facility] C[facility] LAB;  Service: Cardiovascular;  Laterality: N/A;   HPI:  45 year old male with a complicated cardiovascular history including aortic dissection s/p repair and reconstruction, systolic and diastolic HF, HTN who presented  to the ED with confusion, R sided weakness, and changes in mental status as a code stroke. He had a stat head CT and had a witnessed seizure event which resolved spontaneously, although he was treated with ativan. Subsequently, he had depressed mental status and respiratory drive, and was intubated for airway protection. briefly. Found to have lage acute left CVA.    Assessment / Plan / Recommendation Clinical Impression  Pt demonstrates a mild to moderate aphasia with mixed expressive and receptive deficits. Pt has decreased word finding ability with more complex/obscure words and some phonemic paraphasais in repetition and reading. He is able to follow most conversational language, but complex commands reveal inaccuracies in comprehension. Pt also noted to suffer from miscues with numbers and completing mathematical tasks and higher level problem solving and memory when tasks are based in language. Required moderate cues for basic math, which is pert of his daily work. Pt will need f/u outpatient SLP therapy if that is what pt prefers. Will follow acutely.     SLP Assessment  Patient needs continued Speech Lanaguage Pathology Services    Follow Up Recommendations  Outpatient SLP  Frequency and Duration min 2x/week  2 weeks      SLP Evaluation Cognition  Overall Cognitive Status: Within Functional Limits for tasks assessed Memory: Impaired Memory Impairment: Storage deficit;Retrieval deficit;Decreased recall of new information;Decreased short term memory Decreased Short Term Memory: Verbal complex       Comprehension  Auditory Comprehension Overall Auditory Comprehension: Impaired Yes/No Questions: Within Functional Limits Commands: Impaired Two Step Basic Commands: 75-100% accurate Multistep Basic Commands: 50-74% accurate Complex Commands: 25-49% accurate Conversation: Complex EffectiveTechniques: Extra processing time;Repetition Reading Comprehension Reading Status:  Impaired Word level: Not tested Sentence Level: Impaired Paragraph Level: Not tested    Expression Verbal Expression Overall Verbal Expression: Impaired Initiation: No impairment Automatic Speech: Name;Social Response Level of Generative/Spontaneous Verbalization: Conversation Repetition: Impaired Level of Impairment: Sentence level Naming: Impairment Responsive: Not tested Confrontation: Impaired Convergent: Not tested Divergent: Not tested Verbal Errors: Phonemic paraphasias;Aware of errors Pragmatics: No impairment Effective Techniques: Open ended questions;Semantic cues;Phonemic cues Written Expression Dominant Hand: Right (ambidextrous)   Oral / Motor  Oral Motor/Sensory Function Overall Oral Motor/Sensory Function: Within functional limits Motor Speech Overall Motor Speech: Impaired Respiration: Within functional limits Phonation: Low vocal intensity Resonance: Within functional limits Articulation: Impaired Level of Impairment: Sentence Intelligibility: Intelligible Motor Planning: Witnin functional limits Motor Speech Errors: Aware Interfering Components: Inadequate dentition   GO                   Herbie Baltimore, MA CCC-SLP [phone]  Lynann Beaver 03/05/2016, 3:10 PM

## 2016-03-05 NOTE — Progress Notes (Signed)
Physical Therapy Treatment Patient Details Name: [name] MRN: [id number] DOB: January 13, 1971 Today's Date: 03/05/2016    History of Present Illness Patient is a 45 year old male admitted 02/28/16 for confusion, R sideed weakness, and altered mental status. Patient had a seizure and shallow respirations while in CT. Intubated 02/28/16, extubated 03/01/16. PMH includes anxiety, CAD, aortic dissection and repair, CHF, CKD, HTN, and HLD. MRI on 12/19 + for large L MCA CVA.    PT Comments    Pt progressing well, will inform supervising PT to change recommendations.  Plan to assess balance and incorporate high level balance activities.    Follow Up Recommendations  Outpatient PT     Equipment Recommendations  None recommended by PT    Recommendations for Other Services       Precautions / Restrictions Precautions Precautions: Fall Restrictions Weight Bearing Restrictions: No    Mobility  Bed Mobility Overal bed mobility: Modified Independent Bed Mobility: Supine to Sit     Supine to sit: Modified independent (Device/Increase time)     General bed mobility comments: performed without assistance.    Transfers Overall transfer level: Needs assistance Equipment used: None Transfers: Sit to/from Stand Sit to Stand: Modified independent (Device/Increase time)         General transfer comment: Good technique  Ambulation/Gait Ambulation/Gait assistance: Supervision Ambulation Distance (Feet): 350 Feet Assistive device: None Gait Pattern/deviations: Step-through pattern Gait velocity: slightly decreased    General Gait Details: decreased foot clearance on R but able to compensate.  Pt required cues for reciprocal armswing and foot clearance.     Stairs            Wheelchair Mobility    Modified Rankin (Stroke Patients Only)       Balance Overall balance assessment: Needs assistance Sitting-balance support: Feet supported;No upper extremity  supported Sitting balance-Leahy Scale: Normal     Standing balance support: No upper extremity supported;During functional activity Standing balance-Leahy Scale: Good                      Cognition Arousal/Alertness: Awake/alert Behavior During Therapy: WFL for tasks assessed/performed Overall Cognitive Status: Within Functional Limits for tasks assessed                      Exercises      General Comments        Pertinent Vitals/Pain Pain Assessment: No/denies pain    Home Living Family/patient expects to be discharged to:: Private residence Living Arrangements: Spouse/significant other;Children Available Help at Discharge: Family;Available 24 hours/day Type of Home: House Home Access: Stairs to enter Entrance Stairs-Rails: Can reach both Home Layout: One level Home Equipment: Cane - single point      Prior Function Level of Independence: Independent      Comments: drives. works as a Dealer.   PT Goals (current goals can now be found in the care plan section) Acute Rehab PT Goals Patient Stated Goal: return to independence, work on cars  Potential to Achieve Goals: Good Progress towards PT goals: Progressing toward goals    Frequency    Min 3X/week      PT Plan Discharge plan needs to be updated    Co-evaluation             End of Session Equipment Utilized During Treatment: Gait belt Activity Tolerance: Patient tolerated treatment well;Patient limited by fatigue Patient left: in chair;with call bell/phone within reach;with family/visitor present  Time: 4370-0525 PT Time Calculation (min) (ACUTE ONLY): 9 min  Charges:  $Gait Training: 8-22 mins                    G Codes:      Cristela Blue 03-24-16, 3:03 PM Governor Rooks, PTA pager [phone]

## 2016-03-05 NOTE — Progress Notes (Signed)
Nutrition Follow-up  DOCUMENTATION CODES:   Non-severe (moderate) malnutrition in context of chronic illness  INTERVENTION:   -Continue Ensure Enlive po BID, each supplement provides 350 kcal and 20 grams of protein  NUTRITION DIAGNOSIS:   Inadequate oral intake related to  (decreased functional status) as evidenced by meal completion < 50%.  Progressing  GOAL:   Patient will meet greater than or equal to 90% of their needs  Progressing  MONITOR:   PO intake, Supplement acceptance, I & O's  REASON FOR ASSESSMENT:   Consult Enteral/tube feeding initiation and management  ASSESSMENT:   45 y/o male PMHx Aortic dissection/reconstruction, HF, HTN, Anxiety, CAD, ETOH/Tobacco abuse, HLD, GERD. Presented to ED with confusion and R hemiplegia. Code stroke. Had witnessed seizure.  12/18- transferred from ICU to medical floor 12/20- unsuccessful TEE  Per SLP note, pt with mild to moderate aphasia with mixed expressive and receptive deficits. Recommending outpatient SLP services.   Pt on a heart healthy diet. Pt was not in room at time of visit; noted pt consumed about 50% of Ensure shake on sink table. Also observed an eaten bag of buffalo flavored potato chips.   Per meal completion records, PO: 0-80%. Per MAR, pt has accepted Ensure supplements about 50% of the time.   Labs reviewed: K: 3.0.   Diet Order:  Diet Heart Room service appropriate? Yes; Fluid consistency: Thin  Skin:  Reviewed, no issues  Last BM:  03/04/16  Height:   Ht Readings from Last 1 Encounters:  02/29/16 5\' 9"  (1.753 m)    Weight:   Wt Readings from Last 1 Encounters:  03/05/16 157 lb (71.2 kg)    Ideal Body Weight:  72.73 kg  BMI:  Body mass index is 23.18 kg/m.  Estimated Nutritional Needs:   Kcal:  2000-2200  Protein:  98-112 g (1.4-1.6 g/kg bw)  Fluid:  2.1-2.4 L (30-35 ml/kg bw)  EDUCATION NEEDS:   No education needs identified at this time  Turhan Chill A. Jimmye Norman, RD, LDN,  CDE Pager: [phone] After hours Pager: [phone]

## 2016-03-05 NOTE — Evaluation (Signed)
Occupational Therapy Evaluation Patient Details Name: [name] MRN: [id number] DOB: Sep 16, 1970 Today's Date: 03/05/2016    History of Present Illness Patient is a 45 year old male admitted 02/28/16 for confusion, R sideed weakness, and altered mental status. Patient had a seizure and shallow respirations while in CT. Intubated 02/28/16, extubated 03/01/16. PMH includes anxiety, CAD, aortic dissection and repair, CHF, CKD, HTN, and HLD. MRI on 12/19 + for large L MCA CVA.   Clinical Impression   Pt reports he was independent with ADL PTA. Currently pt overall supervision for ADL and functional mobility. Pt presenting with RUE fine motor coordination deficits impacting his independence with ADL. Pt planning to d/c home with 24/7 supervision from family. Recommending outpatient OT for follow up. Pt would benefit from continued skilled OT to establish HEP.    Follow Up Recommendations  Outpatient OT;Supervision - Intermittent    Equipment Recommendations  None recommended by OT    Recommendations for Other Services       Precautions / Restrictions Restrictions Weight Bearing Restrictions: No      Mobility Bed Mobility Overal bed mobility: Modified Independent                Transfers Overall transfer level: Needs assistance Equipment used: None Transfers: Sit to/from Stand Sit to Stand: Supervision              Balance Overall balance assessment: Needs assistance Sitting-balance support: Feet supported;No upper extremity supported Sitting balance-Leahy Scale: Normal     Standing balance support: No upper extremity supported;During functional activity Standing balance-Leahy Scale: Good                              ADL Overall ADL's : Needs assistance/impaired     Grooming: Supervision/safety;Standing   Upper Body Bathing: Sitting;Set up   Lower Body Bathing: Supervison/ safety;Sit to/from stand   Upper Body Dressing : Set  up;Sitting   Lower Body Dressing: Supervision/safety;Sit to/from stand Lower Body Dressing Details (indicate cue type and reason): Pt able to adjust socks sitting EOB and don shoes. Toilet Transfer: Supervision/safety;Ambulation;Regular Glass blower/designer Details (indicate cue type and reason): Simulated by sit to stand from EOB with functional mobility in room.         Functional mobility during ADLs: Supervision/safety General ADL Comments: Educated pt on functional use of R UE and starting fine motor coordination exercies.     Vision Vision Assessment?: No apparent visual deficits   Perception     Praxis      Pertinent Vitals/Pain Pain Assessment: No/denies pain     Hand Dominance  (ambidextrous)   Extremity/Trunk Assessment Upper Extremity Assessment Upper Extremity Assessment: RUE deficits/detail RUE Deficits / Details: strength grossly WFL. impaired fine motor coordination. Pt reports sensation intacf RUE Coordination: decreased fine motor   Lower Extremity Assessment Lower Extremity Assessment: Defer to PT evaluation   Cervical / Trunk Assessment Cervical / Trunk Assessment: Normal   Communication Communication Communication: No difficulties   Cognition Arousal/Alertness: Awake/alert Behavior During Therapy: WFL for tasks assessed/performed Overall Cognitive Status: Within Functional Limits for tasks assessed                     General Comments       Exercises       Shoulder Instructions      Home Living Family/patient expects to be discharged to:: Private residence Living Arrangements: Spouse/significant other;Children Available Help  at Discharge: Family;Available 24 hours/day Type of Home: House Home Access: Stairs to enter [employer] of Steps: 4 Entrance Stairs-Rails: Can reach both Home Layout: One level     Bathroom Shower/Tub: Teacher, early years/pre: Standard     Home Equipment: Cane - single  point          Prior Functioning/Environment Level of Independence: Independent        Comments: drives. works as a Dealer.        OT Problem List: Decreased strength;Impaired balance (sitting and/or standing);Decreased coordination;Impaired UE functional use   OT Treatment/Interventions: Therapeutic exercise;Patient/family education    OT Goals(Current goals can be found in the care plan section) Acute Rehab OT Goals Patient Stated Goal: return to independence, work on cars  OT Goal Formulation: With patient Time For Goal Achievement: 03/19/16 Potential to Achieve Goals: Good ADL Goals Pt/caregiver will Perform Home Exercise Program: Right Upper extremity;Independently;With written HEP provided (increase fine motor coordination)  OT Frequency: Min 2X/week   Barriers to D/C:            Co-evaluation              End of Session Nurse Communication: Mobility status  Activity Tolerance: Patient tolerated treatment well Patient left: in bed;with call bell/phone within reach   Time: 0762-2633 OT Time Calculation (min): 9 min Charges:  OT General Charges $OT Visit: 1 Procedure OT Evaluation $OT Eval Moderate Complexity: 1 Procedure G-Codes:     Binnie Kand M.S., OTR/L Pager: [phone]  03/05/2016, 2:34 PM

## 2016-03-05 NOTE — Progress Notes (Signed)
STROKE TEAM PROGRESS NOTE   SUBJECTIVE (INTERVAL HISTORY) Patient was seen and examined this morning. His wife is at bedside. He reports heart palpitations ever since his surgery in 2016 if he gets up quickly or with exertion. However, he does not experience palpitations at rest. But on repeated asking, he stated that he dose have palpitation at rest. His answers are inconsistent. However, he dose have audible heart sounds/murmur.   OBJECTIVE Temp:  [98 F (36.7 C)-99 F (37.2 C)] 98.5 F (36.9 C) (12/21 0509) Pulse Rate:  [61-78] 78 (12/21 0509) Cardiac Rhythm: Normal sinus rhythm (12/20 1934) Resp:  [16-36] 18 (12/21 0509) BP: (123-179)/(69-90) 146/81 (12/21 0509) SpO2:  [93 %-100 %] 97 % (12/21 0509) Weight:  [157 lb (71.2 kg)] 157 lb (71.2 kg) (12/21 0509)  CBC:  Recent Labs Lab 02/28/16 2007  03/03/16 0455 03/03/16 1640 03/04/16 0512  WBC 6.3  < > 7.0  --  5.9  NEUTROABS 2.4  --   --   --   --   HGB 13.4  < > 11.7*  --  11.4*  HCT 40.3  < > 35.1*  --  34.8*  MCV 92.4  < > 92.1  --  91.8  PLT 115*  < > 80* 92* 89*  < > = values in this interval not displayed.  Basic Metabolic Panel:   Recent Labs Lab 03/02/16 0451 03/03/16 0455 03/04/16 0512  NA 138 139 136  K 3.9 3.5 3.0*  CL 110 109 108  CO2 21* 23 23  GLUCOSE 94 129* 111*  BUN 13 10 11   CREATININE 1.95* 1.69* 1.77*  CALCIUM 8.2* 8.6* 8.5*  MG 1.9 2.0  --   PHOS 3.0 3.3  --     Lipid Panel:     Component Value Date/Time   CHOL 178 03/04/2016 0512   TRIG 119 03/04/2016 0512   HDL 30 (L) 03/04/2016 0512   CHOLHDL 5.9 03/04/2016 0512   VLDL 24 03/04/2016 0512   LDLCALC 124 (H) 03/04/2016 0512   HgbA1c:  Lab Results  Component Value Date   HGBA1C 5.3 03/04/2016   Urine Drug Screen:     Component Value Date/Time   LABOPIA NONE DETECTED 02/29/2016 0030   COCAINSCRNUR NONE DETECTED 02/29/2016 0030   COCAINSCRNUR NEG 01/16/2009 2229   LABBENZ POSITIVE (A) 02/29/2016 0030   LABBENZ NEG 01/16/2009  2229   AMPHETMU NONE DETECTED 02/29/2016 0030   THCU POSITIVE (A) 02/29/2016 0030   LABBARB NONE DETECTED 02/29/2016 0030      IMAGING I have personally reviewed the radiological images below and agree with the radiology interpretations.  Ct Head Code Stroke W/o Cm 02/28/2016 1. No acute intracranial abnormality. Slight worsening of bilateral frontal predominant white matter disease, likely chronic microvascular ischemia. 2. ASPECTS is 10.   Ct Angio Head W Or Wo Contrast Ct Angio Neck W Or Wo Contrast 02/28/2016 1. No intracranial arterial occlusion. 2. Moderate narrowing of the left MCA M2 segments. 3. Moderate to severe narrowing of the right PCA P2 segment. 4. Basilar artery dolichoectasia. 5. Bilateral pulmonary upper lobe consolidation, likely aspiration or pneumonia. 6. Normal CTA of the neck. 7. Aortic atherosclerosis.    Mr Brain Wo Contrast 03/03/2016 1. Large acute left MCA branch infarct. 2. Chronic white matter disease and susceptibility foci, with differential discussion above. 3. Arteriomegaly and vertebrobasilar tortuosity.   2D Echocardiogram  - Left ventricle: The cavity size was normal. Wall thickness was increased in a pattern of severe LVH. Systolic  function was normal. The estimated ejection fraction was in the range of 55% to 60%. Wall motion was normal; there were no regional wall motion abnormalities. Features are consistent with a pseudonormal left ventricular filling pattern, with concomitant abnormal relaxation and increased filling pressure (grade 2 diastolic dysfunction). - Aortic valve: A bioprosthesis was present and functioning normally. Valve area (VTI): 1.74 cm^2. Valve area (Vmax): 1.56 cm^2. Valve area (Vmean): 1.69 cm^2. - Aorta: The proximal aorta is dilated. In one view what appears to be a disseection flap is seen more distally . Suggest TEE if further eval needed. - Mitral valve: There was mild to moderate regurgitation. - Left atrium: The atrium  was moderately dilated.  LE venous doppler - No evidence of deep vein thrombosis or baker's cysts bilaterally.  TEE pending   PHYSICAL EXAM  Temp:  [98 F (36.7 C)-99 F (37.2 C)] 98.5 F (36.9 C) (12/21 0509) Pulse Rate:  [61-78] 78 (12/21 0509) Resp:  [16-36] 18 (12/21 0509) BP: (123-179)/(69-90) 146/81 (12/21 0509) SpO2:  [93 %-100 %] 97 % (12/21 0509) Weight:  [157 lb (71.2 kg)] 157 lb (71.2 kg) (12/21 0509)  General - Well nourished, well developed, in no apparent distress.  Ophthalmologic - Fundi not visualized due to eye movement.  Cardiovascular - Regular rate and rhythm.  Mental Status -  Level of arousal and orientation to time, place, and person were intact. Language including expression, naming, repetition, comprehension was assessed and found intact. Fund of Knowledge was assessed and was intact.  Cranial Nerves II - XII - II - Visual field intact OU. III, IV, VI - Extraocular movements intact. V - Facial sensation intact bilaterally. VII - Facial movement intact bilaterally. VIII - Hearing & vestibular intact bilaterally. X - Palate elevates symmetrically. XI - Chin turning & shoulder shrug intact bilaterally. XII - Tongue protrusion intact.  Motor Strength - The patient's strength was normal in all extremities and pronator drift was absent.  Bulk was normal and fasciculations were absent.   Motor Tone - Muscle tone was assessed at the neck and appendages and was normal.  Reflexes - The patient's reflexes were 1+ in all extremities and he had no pathological reflexes.  Sensory - Light touch, temperature/pinprick were assessed and were symmetrical.    Coordination - The patient had normal movements in the hands and feet with no ataxia or dysmetria.  Tremor was absent.  Gait and Station - deferred.   ASSESSMENT/PLAN Mr. DESSIE DELCARLO is a 45 y.o. male with history of aortic dissection status post repair and reconstruction, systolic and diastolic heart  failure, and hypertension presenting to the ER 02/28/2016 with confusion and right-sided weakness. In ED, had seizure and subsequent cardiac arrest. CTA without clear LVO. Intubated and started on Keppra. Post extubation had mild right hemiparesis and word finding difficulty. MRI showed left brain embolic stroke. He was not considered for IV TPA due to seizure at onset with non-stroke presentation.  Stroke:  Moderate left MCA branch infarct, embolic secondary to unknown source. Concerning for paroxysmal afib   Resultant  Deficit resolved  Code stroke CT no acute abnormality   CT angiogram head and neck no LVO. Moderate narrowing left M2 and right P2.  MRI  moderate left MCA branch infarct  2D Echo  EF 55-60%. No source of embolus   Lower extremity Doppler negative for DVT  TEE pending   Consider loop recorder since pt has palpitation after surgery  LDL 124  HgbA1c 5.3  Heparin  5000 units sq tid for VTE prophylaxis Diet Heart Room service appropriate? Yes; Fluid consistency: Thin  aspirin 81 mg daily prior to admission, now on aspirin 325 mg daily. Recommend to change to plavix for further stroke prevention.  Patient counseled to be compliant with his antithrombotic medications  Ongoing aggressive stroke risk factor management  Therapy recommendations:  Home health PT  Disposition:  Pending  Seizure   Witness GTC in ED  EEG without epileptiform activity   No further seizure   Continue Keppra 500 mg twice a day  Hx of aortic dissection s/p repair   Extensive surgical repair in 04/2014  Transient cardiomyopathy  Currently EF 55-60%   Hypertensive urgency  Initially treated with Cardene, now off  Stable now  On multiple BP meds  Educated on BP meds compliance and monitoring at home  Long-term BP goal normotensive  Hyperlipidemia  Home meds:  Lipitor 20  LDL 124, goal < 70  Resumed statin   Continue statin at discharge  Tobacco abuse  Current  smoker  Uses chewing tobacco  Smoking cessation counseling provided  Pt is willing to quit  Other Stroke Risk Factors  Marijuana use, urine drug screen positive for THC this admission, patient counseled of stroke risk and advised to stop use   Family hx stroke (maternal aunt)  Coronary artery disease  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 After hours, contact General Neurology

## 2016-03-06 ENCOUNTER — Other Ambulatory Visit: Payer: Self-pay | Admitting: Neurology

## 2016-03-06 DIAGNOSIS — I63412 Cerebral infarction due to embolism of left middle cerebral artery: Secondary | ICD-10-CM

## 2016-03-06 LAB — CBC
HCT: 35.9 % — ABNORMAL LOW (ref 39.0–52.0)
Hemoglobin: 11.8 g/dL — ABNORMAL LOW (ref 13.0–17.0)
MCH: 30.3 pg (ref 26.0–34.0)
MCHC: 32.9 g/dL (ref 30.0–36.0)
MCV: 92.1 fL (ref 78.0–100.0)
Platelets: 118 10*3/uL — ABNORMAL LOW (ref 150–400)
RBC: 3.9 MIL/uL — ABNORMAL LOW (ref 4.22–5.81)
RDW: 14.4 % (ref 11.5–15.5)
WBC: 5.4 10*3/uL (ref 4.0–10.5)

## 2016-03-06 LAB — BASIC METABOLIC PANEL
Anion gap: 7 (ref 5–15)
BUN: 13 mg/dL (ref 6–20)
CO2: 23 mmol/L (ref 22–32)
Calcium: 9 mg/dL (ref 8.9–10.3)
Chloride: 107 mmol/L (ref 101–111)
Creatinine, Ser: 1.87 mg/dL — ABNORMAL HIGH (ref 0.61–1.24)
GFR calc Af Amer: 49 mL/min — ABNORMAL LOW (ref >60)
GFR calc non Af Amer: 42 mL/min — ABNORMAL LOW (ref >60)
Glucose, Bld: 97 mg/dL (ref 65–99)
Potassium: 4.1 mmol/L (ref 3.5–5.1)
Sodium: 137 mmol/L (ref 135–145)

## 2016-03-06 MED ORDER — CLOPIDOGREL BISULFATE 75 MG PO TABS: 75.0000 mg | ORAL_TABLET | Freq: Every day | ORAL | 0 refills | Status: DC

## 2016-03-06 MED ORDER — LEVETIRACETAM 500 MG PO TABS: 500.0000 mg | ORAL_TABLET | Freq: Two times a day (BID) | ORAL | 0 refills | Status: DC

## 2016-03-06 NOTE — Discharge Instructions (Signed)
Clopidogrel tablets What is this medicine? CLOPIDOGREL (kloh PID oh grel) helps to prevent blood clots. This medicine is used to prevent heart attack, stroke, or other vascular events in people who are at high risk. This medicine may be used for other purposes; ask your health care provider or pharmacist if you have questions. COMMON BRAND NAME(S): Plavix What should I tell my health care provider before I take this medicine? They need to know if you have any of the following conditions: -bleeding disorders -bleeding in the brain -having surgery -history of stomach bleeding -an unusual or allergic reaction to clopidogrel, other medicines, foods, dyes, or preservatives -pregnant or trying to get pregnant -breast-feeding How should I use this medicine? Take this medicine by mouth with a glass of water. Follow the directions on the prescription label. You may take this medicine with or without food. If it upsets your stomach, take it with food. Take your medicine at regular intervals. Do not take it more often than directed. Do not stop taking except on your doctor's advice. A special MedGuide will be given to you by the pharmacist with each prescription and refill. Be sure to read this information carefully each time. Talk to your pediatrician regarding the use of this medicine in children. Special care may be needed. Overdosage: If you think you have taken too much of this medicine contact a poison control center or emergency room at once. NOTE: This medicine is only for you. Do not share this medicine with others. What if I miss a dose? If you miss a dose, take it as soon as you can. If it is almost time for your next dose, take only that dose. Do not take double or extra doses. What may interact with this medicine? Do not take this medicine with the following medications: -dasabuvir; ombitasvir; paritaprevir; ritonavir -defibrotide This medicine may also interact with the following  medications: -antiviral medicines for HIV or AIDS -aspirin -certain medicines for depression like citalopram, fluoxetine, fluvoxamine -certain medicines for fungal infections like ketoconazole, fluconazole, voriconazole -certain medicines for seizures like felbamate, oxcarbazepine, phenytoin -certain medicines for stomach problems like cimetidine, omeprazole, esomeprazole -certain medicines that treat or prevent blood clots like warfarin, enoxaparin, dalteparin, apixaban, dabigatran, rivaroxaban, ticlopidine -chloramphenicol -cilostazol -fluvastatin -isoniazid -modafinil -nicardipine -NSAIDS, medicines for pain and inflammation, like ibuprofen or naproxen -quinine -repaglinide -tamoxifen -tolbutamide -topiramate -torsemide This list may not describe all possible interactions. Give your health care provider a list of all the medicines, herbs, non-prescription drugs, or dietary supplements you use. Also tell them if you smoke, drink alcohol, or use illegal drugs. Some items may interact with your medicine. What should I watch for while using this medicine? Visit your doctor or health care professional for regular check ups. Do not stop taking your medicine unless your doctor tells you to. Notify your doctor or health care professional and seek emergency treatment if you develop breathing problems; changes in vision; chest pain; severe, sudden headache; pain, swelling, warmth in the leg; trouble speaking; sudden numbness or weakness of the face, arm or leg. These can be signs that your condition has gotten worse. If you are going to have surgery or dental work, tell your doctor or health care professional that you are taking this medicine. Certain genetic factors may reduce the effect of this medicine. Your doctor may use genetic tests to determine treatment. What side effects may I notice from receiving this medicine? Side effects that you should report to your doctor or health care  professional as soon as possible: -allergic reactions like skin rash, itching or hives, swelling of the face, lips, or tongue -signs and symptoms of bleeding such as bloody or black, tarry stools; red or dark-brown urine; spitting up blood or brown material that looks like coffee grounds; red spots on the skin; unusual bruising or bleeding from the eye, gums, or nose -signs and symptoms of a blood clot such as breathing problems; changes in vision; chest pain; severe, sudden headache; pain, swelling, warmth in the leg; trouble speaking; sudden numbness or weakness of the face, arm or leg Side effects that usually do not require medical attention (report to your doctor or health care professional if they continue or are bothersome): -constipation -diarrhea -headache -upset stomach This list may not describe all possible side effects. Call your doctor for medical advice about side effects. You may report side effects to FDA at 1-800-FDA-1088. Where should I keep my medicine? Keep out of the reach of children. Store at room temperature of 59 to 86 degrees F (15 to 30 degrees C). Throw away any unused medicine after the expiration date. NOTE: This sheet is a summary. It may not cover all possible information. If you have questions about this medicine, talk to your doctor, pharmacist, or health care provider.  2017 Elsevier/Gold Standard (2014-12-06 10:00:44) Ischemic Stroke An ischemic stroke is the sudden death of brain tissue. Blood carries oxygen to all areas of the body. This type of stroke happens when your blood does not flow to your brain like normal. Your brain cannot get the oxygen it needs. This is an emergency. It must be treated right away. Symptoms of a stroke usually happen all of a sudden. You may notice them when you wake up. They can include:  Weakness or loss of feeling in your face, arm, or leg. This often happens on one side of the body.  Trouble walking.  Trouble moving your  arms or legs.  Loss of balance or coordination.  Feeling confused.  Trouble talking or understanding what people are saying.  Slurred speech.  Trouble seeing.  Seeing two of one object (double vision).  Feeling dizzy.  Feeling sick to your stomach (nauseous) and throwing up (vomiting).  A very bad headache for no reason. Get help as soon as any of these problems start. This is important. Some treatments work better if they are given right away. These include:  Aspirin.  Medicines to control blood pressure.  A shot (injection) of medicine to break up the blood clot.  Treatments given in the blood vessel (artery) to take out the clot or break it up. Other treatments may include:  Oxygen.  Fluids given through an IV tube.  Medicines to thin out your blood.  Procedures to help your blood flow better. What increases the risk? Certain things may make you more likely to have a stroke. Some of these are things that you can change, such as:  Being very overweight (obesity).  Smoking.  Taking birth control pills.  Not being active.  Drinking too much alcohol.  Using drugs. Other risk factors include:  High blood pressure.  High cholesterol.  Diabetes.  Heart disease.  Being [place] American, Native American, Hispanic, or [place] Native.  Being over age 63.  Family history of stroke.  Having had blood clots, stroke, or warning stroke (transient ischemic attack, TIA) in the past.  Sickle cell disease.  Being a woman with a history of high blood pressure in pregnancy (preeclampsia).  Migraine  headache.  Sleep apnea.  Having an irregular heartbeat (atrial fibrillation).  Long-term (chronic) diseases that cause soreness and swelling (inflammation).  Disorders that affect how your blood clots. Follow these instructions at home: Medicines  Take over-the-counter and prescription medicines only as told by your doctor.  If you were told to take  aspirin or another medicine to thin your blood, take it exactly as told by your doctor.  Taking too much of the medicine can cause bleeding.  If you do not take enough, it may not work as well.  Know the side effects of your medicines. If you are taking a blood thinner, make sure you:  Hold pressure over any cuts for longer than usual.  Tell your dentist and other doctors that you take this medicine.  Avoid activities that may cause damage or injury to your body. Eating and drinking  Follow instructions from your doctor about what you cannot eat or drink.  Eat healthy foods.  If you have trouble with swallowing, do these things to avoid choking:  Take small bites when eating.  Eat foods that are soft or pureed. Safety  Follow instructions from your health care team about physical activity.  Use a walker or cane as told by your doctor.  Keep your home safe so you do not fall. This may include:  Having experts look at your home to make sure it is safe.  Putting grab bars in the bedroom and bathroom.  Using raised toilets.  Putting a seat in the shower. General instructions  Do not use any tobacco products.  Examples of these are cigarettes, chewing tobacco, and e-cigarettes.  If you need help quitting, ask your doctor.  Limit how much alcohol you drink. This means no more than 1 drink a day for nonpregnant women and 2 drinks a day for men. One drink equals 12 oz of beer, 5 oz of wine, or 1 oz of hard liquor.  If you need help to stop using drugs or alcohol, ask your doctor to refer you to a program or specialist.  Stay active. Exercise as told by your doctor.  Keep all follow-up visits as told by your doctor. This is important. Get help right away if:  You suddenly:  Have weakness or loss of feeling in your face, arm, or leg.  Feel confused.  Have trouble talking or understanding what people are saying.  Have trouble seeing.  Have trouble  walking.  Have trouble moving your arms or legs.  Feel dizzy.  Lose your balance or coordination.  Have a very bad headache and you do not know why.  You pass out (lose consciousness) or almost pass out.  You have jerky movements that you cannot control (seizure). These symptoms may be an emergency. Do not wait to see if the symptoms will go away. Get medical help right away. Call your local emergency services (911 in the [place] Do not drive yourself to the hospital.  This information is not intended to replace advice given to you by your health care provider. Make sure you discuss any questions you have with your health care provider. Document Released: 02/19/2011 Document Revised: 08/13/2015 Document Reviewed: 05/29/2015 Elsevier Interactive Patient Education  2017 [employer].

## 2016-03-06 NOTE — Care Management Important Message (Signed)
Important Message  Patient Details  Name: [name] MRN: [id number] Date of Birth: September 15, 1970   Medicare Important Message Given:  Yes    [name] 03/06/2016, 11:02 AM

## 2016-03-06 NOTE — Consult Note (Signed)
ELECTROPHYSIOLOGY CONSULT NOTE  Patient ID: [name] MRN: [id number], DOB/AGE: 05-21-1970   Admit date: 02/28/2016 Date of Consult: 03/06/2016  Primary Physician: [name], MD Primary Cardiologist: Dr. Aundra Dubin Reason for Consultation: Cryptogenic stroke ; recommendations regarding Implantable Loop Recorder  History of Present Illness [name] was admitted on 02/28/2016 with PMHx of chronic type B aortic dissection, HTN, NICM likely related to HTN heart disease with past hospitalizations with hypertensive emergencies, arotic dissection suspect secondary to HTN, tobacco and ETOH abuse, + THC, CKD, valvular heart disease with bioprosthetic AVR, [facility] to [facility]/28/16 with stroke, hypertensive emergency tx wth cardene gtt, seizure, required intubation early in his stay.  Imaging demonstrated Moderate left MCA branch infarct, embolic secondary to unknown source. Concerning for paroxysmal afib.  he has undergone workup for stroke including echocardiogram and carotid angio.  The patient has been monitored on telemetry which has demonstrated sinus rhythm with no arrhythmias.  Inpatient stroke work-up is to be completed with a TEE, though initially aborted due to inability to adequately sedate the patient, and not re-scheduled at this time.   Echocardiogram this admission demonstrated   Study Conclusions - Left ventricle: The cavity size was normal. Wall thickness was   increased in a pattern of severe LVH. Systolic function was   normal. The estimated ejection fraction was in the range of 55%   to 60%. Wall motion was normal; there were no regional wall   motion abnormalities. Features are consistent with a pseudonormal   left ventricular filling pattern, with concomitant abnormal   relaxation and increased filling pressure (grade 2 diastolic   dysfunction). - Aortic valve: A bioprosthesis was present and functioning   normally. Valve area (VTI): 1.74 cm^2. Valve area (Vmax):  1.56   cm^2. Valve area (Vmean): 1.69 cm^2. - Aorta: The proximal aorta is dilated. In one view what appears to   be a disseection flap is seen more distally . Suggest TEE if   further eval needed. - Mitral valve: There was mild to moderate regurgitation. - Left atrium: The atrium was moderately dilated.  CT head/neck notes; There is an aortic arch endograft stent. There is no aneurysm or dissection of the visualized ascending aorta or aortic arch.  Lab work is reviewed.  Prior to admission, the patient denies chest pain, shortness of breath, dizziness, or syncope.  He does though report significant palpitations with the sensation of racing heart beat whenever he does even minimal activities ever since his AVR/endograft surgery.    EP has been asked to evaluate for placement of an implantable loop recorder to monitor for atrial fibrillation.   Past Medical History:  Diagnosis Date  . Adenomatous colon polyp 08/2015  . Anxiety   . Aortic disease ([place])   . Aortic dissection ([facility])    a. admx 04/2014 >> L renal infarct; a/c renal failure >> b.  s/p Bioprosthetic [place] and total arch replacement and staged endovascular repair of descending aortic aneurysm ([facility] - Dr. [name])  . CAD (coronary artery disease)    a. LHC 4/16:  oD1 60%  . Cardiomyopathy ([place])    a. non-ischemic - probably related to untreated HTN and ETOH abuse - Echo 3/13 with EF 35-40% >> b. Echo 4/16: Severe LVH, EF 55-60%, moderate AI, moderate MR, mild LAE, trivial effusion, known type B dissection with communication between true and false lumens with suprasternal images suggesting dissection plane may propagate to at least left subclavian takeoff, root above aortic valve okay    .  Chronic abdominal pain   . Chronic combined systolic and diastolic congestive heart failure ([place])    a. 05/2011: Adm with pulm edema/HTN urgency, EF 35-40% with diffuse hypokinesis and moderate to severe mitral regurgitation. Cardiomyopathy  likely due to uncontrolled HTN and ETOH abuse - cath deferred due to renal insufficiency (felt due to uncontrolled HTN). bJodie Echevaria MV 06/2011: EF 37% and no ischemia or infarction. c. EF 45-50% by echo 01/2012.  [name] Chronic sinusitis   . CKD (chronic kidney disease)    a. 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  peak creatinine 3.46 during admx for aortic dissection 2/16.  sees Dr [name]  . Colon polyps 09/11/2015   Descending, sigmoid polyps  . DDD (degenerative disc disease), lumbar   . Descending thoracic aortic aneurysm ([place])   . Dissecting aneurysm of thoracic aorta ([place])   . ETOH abuse    a. Reported to have quit 05/2011.  [name] GERD (gastroesophageal reflux disease)   . Headache(784.0)    "q other day" (08/08/2013)  . Heart murmur   . Hemorrhoid thrombosis   . History of echocardiogram    Echo 1/17:  Severe LVH, EF 55-60%, no RWMA, Gr 2 DD, AVR ok, mild to mod MR, mild LAE, mild reduced RVSF, mod RAE  . History of medication noncompliance   . HYPERLIPIDEMIA   . Hypertension    a. Hx of HTN urgency secondary to noncompliance. b. urinary metanephrine and catecholeamine levels normal 2013.  c. Renal art [name] 1/16:  No evidence of renal artery stenosis noted bilaterally.  . INGUINAL HERNIA   . Pneumonia ~ 2013  . Renal insufficiency   . Tobacco abuse   . Valvular heart disease    a. Echo 05/2011: moderate to severe eccentric MR and mild to moderate AI with prolapsing left coronary cusp. b. Echo 01/2012: mild-mod AI, mild dilitation of aortic root, mild MR.;  c. Echo 1/16: Severe LVH consistent with hypertrophic cardio myopathy, EF 50%, no RWMA, mod AI, mild MR, mild RAE, dilated Ao root (40 mm);      Surgical History:  Past Surgical History:  Procedure Laterality Date  . ANKLE SURGERY Bilateral    Fractures bilaterally  . AORTIC VALVE SURGERY  09/2014  . FOOT FRACTURE SURGERY Bilateral 2004-2010   "got pins in both of them"  . HEMORRHOID SURGERY N/A 06/15/2015   Procedure: HEMORRHOIDECTOMY;   Surgeon: Stark Klein, MD;  Location: [place];  Service: General;  Laterality: N/A;  . INGUINAL HERNIA REPAIR Right ~ 1996  . LEFT HEART CATHETERIZATION WITH CORONARY ANGIOGRAM N/A 06/21/2014   Procedure: LEFT HEART CATHETERIZATION WITH CORONARY ANGIOGRAM;  Surgeon: Larey Dresser, MD;  Location: [facility] CATH LAB;  Service: Cardiovascular;  Laterality: N/A;     Prescriptions Prior to Admission  Medication Sig Dispense Refill Last Dose  . acetaminophen (TYLENOL) 500 MG tablet Take 1,000 mg by mouth 3 (three) times daily.   unknown  . amLODipine (NORVASC) 10 MG tablet Take 10 mg by mouth daily.   unknown  . aspirin EC 81 MG tablet Take 81 mg by mouth daily.   unknown  . atorvastatin (LIPITOR) 20 MG tablet Take 20 mg by mouth daily.   unknown  . cloNIDine (CATAPRES) 0.2 MG tablet Take 1 tablet (0.2 mg total) by mouth 3 (three) times daily. (Patient taking differently: Take 0.2 mg by mouth 2 (two) times daily. )   unknown  . hydrALAZINE (APRESOLINE) 50 MG tablet Take 50 mg by mouth 2 (two) times daily.  unknown  . labetalol (NORMODYNE) 300 MG tablet Take 2 tablets (600 mg total) by mouth 2 (two) times daily. (Patient taking differently: Take 300 mg by mouth 2 (two) times daily. ) 240 tablet 6 unknown  . lidocaine (LIDODERM) 5 % Place 1 patch onto the skin daily. Remove & Discard patch within 12 hours or as directed by MD   unknown  . meclizine (ANTIVERT) 25 MG tablet Take 25 mg by mouth 3 (three) times daily as needed for nausea.   unknown  . omeprazole (PRILOSEC) 20 MG capsule Take 1 capsule (20 mg total) by mouth daily. 30 capsule 0 unknown  . acetaminophen-codeine (TYLENOL #3) 300-30 MG tablet Take 1 tablet by mouth every 8 (eight) hours as needed for moderate pain. (Patient not taking: Reported on 02/29/2016) 15 tablet 0 Not Taking at Unknown time  . DULoxetine (CYMBALTA) 30 MG capsule Take 30 mg by mouth daily.   unknown    Inpatient Medications:  .  stroke: mapping our early stages of recovery book    Does not apply Once  . amLODipine  10 mg Oral Daily  . amoxicillin-clavulanate  1 tablet Oral Q12H  . atorvastatin  20 mg Oral Daily  . cloNIDine  0.2 mg Oral TID  . clopidogrel  75 mg Oral Daily  . famotidine  20 mg Oral QHS  . feeding supplement (ENSURE ENLIVE)  237 mL Oral BID BM  . heparin  5,000 Units Subcutaneous Q8H  . hydrALAZINE  50 mg Oral BID  . labetalol  300 mg Oral BID  . levETIRAcetam  500 mg Intravenous Q12H  . pneumococcal 23 valent vaccine  0.5 mL Intramuscular Tomorrow-1000    Allergies:  Allergies  Allergen Reactions  . Imdur [Isosorbide Nitrate] Other (See Comments)    headache  . Tramadol Other (See Comments)    "headaches" per pt    Social History   Social History  . Marital status: Married    Spouse name: N/A  . Number of children: 5  . Years of education: N/A   Occupational History  . Disabled, part-time Programmer, systems     Disability   Social History Main Topics  . Smoking status: Current Some Day Smoker    Packs/day: 0.25    Years: 23.00    Types: Cigarettes  . Smokeless tobacco: Former Systems developer    Types: Chew     Comment: as of 05-22-14 down to 3-4 cigs daily- 06/14/15- 4 cigs  . Alcohol use No     Comment: + previous use  . Drug use:     Types: Marijuana     Comment: 08/08/2013 "twice a month"  last time 06/13/15  . Sexual activity: Yes   Other Topics Concern  . Not on file   Social History Narrative  . No narrative on file     Family History  Problem Relation Age of Onset  . Hypertension    . Colon cancer Paternal Uncle   . Stroke Maternal Aunt   . Heart attack Brother   . Diabetes Maternal Aunt   . Throat cancer Neg Hx   . Pancreatic cancer Neg Hx   . Esophageal cancer Neg Hx   . Kidney disease Neg Hx   . Liver disease Neg Hx       Review of Systems: All other systems reviewed and are otherwise negative except as noted above.  Physical Exam: Vitals:   03/05/16 2108 03/06/16 0240 03/06/16 0454 03/06/16 0500  BP: (!)  148/87  133/71 (!) 155/80   Pulse: 65 70 62   Resp: 18 18 18    Temp: 98.6 F (37 C) 98.3 F (36.8 C) 98.2 F (36.8 C)   TempSrc: Oral Oral Oral   SpO2: 98% 99% 98%   Weight:    155 lb 3.2 oz (70.4 kg)  Height:        GEN- The patient is well appearing, alert and oriented x 3 today.   Head- normocephalic, atraumatic Eyes-  Sclera clear, conjunctiva pink Ears- hearing intact Oropharynx- clear Neck- supple Lungs- Clear to ausculation bilaterally, normal work of breathing Heart- Regular rate and rhythm, valve is appreciated, no murmurs, rubs or gallops  GI- soft, NT, ND Extremities- no clubbing, cyanosis, or edema MS- no significant deformity or atrophy Skin- no rash or lesion Psych- euthymic mood, full affect   Labs:   Lab Results  Component Value Date   WBC 5.4 03/06/2016   HGB 11.8 (L) 03/06/2016   HCT 35.9 (L) 03/06/2016   MCV 92.1 03/06/2016   PLT 118 (L) 03/06/2016    Recent Labs Lab 03/04/16 0512 03/06/16 0550  NA 136 137  K 3.0* 4.1  CL 108 107  CO2 23 23  BUN 11 13  CREATININE 1.77* 1.87*  CALCIUM 8.5* 9.0  PROT 6.2*  --   BILITOT 0.8  --   ALKPHOS 62  --   ALT 23  --   AST 31  --   GLUCOSE 111* 97   Lab Results  Component Value Date   CKTOTAL 122 03/27/2014   CKMB 1.5 05/23/2011   TROPONINI 0.10 (H) 09/08/2015   Lab Results  Component Value Date   CHOL 178 03/04/2016   CHOL 182 03/27/2014   CHOL 182 02/21/2013   Lab Results  Component Value Date   HDL 30 (L) 03/04/2016   HDL 57 03/27/2014   HDL 55 02/21/2013   Lab Results  Component Value Date   LDLCALC 124 (H) 03/04/2016   LDLCALC 115 (H) 03/27/2014   LDLCALC 101 (H) 02/21/2013   Lab Results  Component Value Date   TRIG 119 03/04/2016   TRIG 111 02/29/2016   TRIG 49 03/27/2014   Lab Results  Component Value Date   CHOLHDL 5.9 03/04/2016   CHOLHDL 3.2 03/27/2014   CHOLHDL 3.3 02/21/2013   No results found for: LDLDIRECT  Lab Results  Component Value Date   DDIMER  >20.00 (H) 03/03/2016     Radiology/Studies:  Ct Abdomen Pelvis Wo Contrast Result Date: 03/04/2016 CLINICAL DATA:  Abdominal pain tonight. EXAM: CT ABDOMEN AND PELVIS WITHOUT CONTRAST TECHNIQUE: Multidetector CT imaging of the abdomen and pelvis was performed following the standard protocol without IV contrast. COMPARISON:  03/21/2015 FINDINGS: Lower chest: Descending thoracic aortic dissection with endovascular stent graft, appearing grossly unchanged. The adjacent left lung base atelectasis. No acute findings are evident in the lower chest. Hepatobiliary: Cholelithiasis. No focal liver lesion is evident on unenhanced scanning. No bile duct dilatation. Pancreas: Unremarkable unenhanced appearances of the pancreas. No inflammatory changes or duct dilatation. Spleen: Normal in size without focal abnormality. Adrenals/Urinary Tract: No urinary calculi. No hydronephrosis. No focal parenchymal lesions are evident. Urinary bladder is unremarkable. Adrenals are unremarkable. Stomach/Bowel: Stomach is within normal limits. Appendix is normal. No evidence of bowel wall thickening, distention, or inflammatory changes. Vascular/Lymphatic: Upper abdominal aortic aneurysm and chronic dissection. Aortic atherosclerosis. Bilateral common iliac artery aneurysms. No retroperitoneal hematoma, but cannot assess for new dissection or luminal patency in the absence of intravenous  contrast. No pathologic adenopathy is evident in the abdomen or pelvis. Reproductive: Unremarkable Other: Bland-appearing air bubbles in the subcutaneous tissues of the anterior abdominal wall likely represent injection sites. No drainable collections. No acute inflammatory changes are evident in the abdomen or pelvis. No ascites. The benign-appearing low-attenuation right inguinal lesion continues to decrease in size, now measuring 2.9 cm in longest axis compared to 4 cm on 03/21/2015. Musculoskeletal: No significant skeletal lesions. IMPRESSION: 1.  Extensive thoracic and abdominal aortic dissection with endoluminal stent graft. No conclusive interval changes, but evaluation is limited in the absence of intravenous contrast. 2. No acute inflammatory changes in the abdomen or pelvis. No bowel obstruction or perforation. No acute urologic abnormality. 3. Cholelithiasis. Electronically Signed   By: Andreas Newport M.D.   On: 03/04/2016 01:06   Ct Angio Head W Or Wo Contrast Result Date: 02/28/2016 CLINICAL DATA:  Suspected stroke.  Seizure during the examination. EXAM: CT ANGIOGRAPHY HEAD AND NECK TECHNIQUE: Multidetector CT imaging of the head and neck was performed using the standard protocol during bolus administration of intravenous contrast. Multiplanar CT image reconstructions and MIPs were obtained to evaluate the vascular anatomy. Carotid stenosis measurements (when applicable) are obtained utilizing NASCET criteria, using the distal internal carotid diameter as the denominator. CONTRAST:  50 mL Isovue 370 IV COMPARISON:  None. FINDINGS: CTA NECK FINDINGS Aortic arch: There is an aortic arch endograft stent. There is no aneurysm or dissection of the visualized ascending aorta or aortic arch. Proximal subclavian arteries are normal. Right carotid system: The right common carotid origin is widely patent. There is no common carotid or internal carotid artery dissection or aneurysm. No hemodynamically significant stenosis. Left carotid system: The left common carotid origin is widely patent. There is no common carotid or internal carotid artery dissection or aneurysm. No hemodynamically significant stenosis. Vertebral arteries: The vertebral system is left dominant. Both vertebral artery origins are normal. Both vertebral arteries are normal to their confluence with the basilar artery. Skeleton: There is no bony spinal canal stenosis. No lytic or blastic lesions. Other neck: The nasopharynx is clear. The oropharynx and hypopharynx are normal. The  epiglottis is normal. The supraglottic larynx, glottis and subglottic larynx are normal. No retropharyngeal collection. The parapharyngeal spaces are preserved. The parotid and submandibular glands are normal. No sialolithiasis or salivary ductal dilatation. The thyroid gland is normal. There is no cervical lymphadenopathy. Upper chest: There is bilateral upper lobe consolidation. Review of the MIP images confirms the above findings CTA HEAD FINDINGS Anterior circulation: --Intracranial internal carotid arteries: Normal. 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 Otherwise, the anterior cerebral arteries are normal. --Middle cerebral arteries: Moderate narrowing of the left M2 segment proximally. Mild irregularity of the Otherwise normal. --Posterior communicating arteries: Absent bilaterally. Posterior circulation: --Posterior cerebral arteries: There is moderate to severe narrowing of the P2 segment of the right PCA. There is mild multifocal narrowing of the left PCA. --Superior cerebellar arteries: Normal. --Basilar artery: There is basilar dolichoectasia, with luminal diameter measuring up to 5.5 mm. --Anterior inferior cerebellar arteries: Not clearly visualized, which is not uncommon. --Posterior inferior cerebellar arteries: Normal. Venous sinuses: As permitted by contrast timing, patent. Anatomic variants: None Review of the MIP images confirms the above findings IMPRESSION: 1. No intracranial arterial occlusion. 2. Moderate narrowing of the left MCA M2 segments. 3. Moderate to severe narrowing of the right PCA P2 segment. 4. Basilar artery dolichoectasia. 5. Bilateral pulmonary upper lobe consolidation, likely aspiration  or pneumonia. 6. Normal CTA of the neck. 7. Aortic atherosclerosis. Electronically Signed   By: Ulyses Jarred M.D.   On: 02/28/2016 22:19   Mr Brain Wo Contrast Result Date: 03/03/2016 CLINICAL DATA:  Seizure. EXAM: MRI HEAD WITHOUT  CONTRAST TECHNIQUE: Multiplanar, multiecho pulse sequences of the brain and surrounding structures were obtained without intravenous contrast. COMPARISON:  CTA head neck 4 days ago. FINDINGS: Brain: Large area of confluent restricted diffusion that is cortically based and extends from the posterior insula to the high frontal parietal junction. This is in superior division left MCA territory. No superimposed hemorrhage. No acute infarct elsewhere. Patchy FLAIR hyperintensity in the cerebral white matter, likely chronic microvascular ischemic gliosis. There are numerous foci of susceptibility artifact in the deep gray nuclei, brainstem, and bilateral cerebellum. There is also peripheral (cortical or sulcal) susceptibility foci. Usually this is from chronic hypertension. Given the patient's vascular disease and previous surgery these could also be from primary vasculopathy or metallic emboli. Negative for mass or hydrocephalus. Symmetric normal hippocampal formation appearance. Vascular: Arteriomegaly and vertebrobasilar tortuosity. Skull and upper cervical spine: Negative Sinuses/Orbits: Mucous retention cysts in the maxillary antra and adenoid. IMPRESSION: 1. Large acute left MCA branch infarct. 2. Chronic white matter disease and susceptibility foci, with differential discussion above. 3. Arteriomegaly and vertebrobasilar tortuosity. Electronically Signed   By: Monte Fantasia M.D.   On: 03/03/2016 13:57   Dg Chest Port 1 View Result Date: 03/01/2016 CLINICAL DATA:  Respiratory failure. EXAM: PORTABLE CHEST 1 VIEW COMPARISON:  02/28/2016. FINDINGS: Stable enlarged cardiac silhouette, median sternotomy wires, prosthetic aortic valve and aortic stent graft. Nasogastric tube tip in the proximal stomach. Endotracheal tube in satisfactory position. Increased patchy opacity at the left lung base and interval mild patchy opacity at the right lung base. Unremarkable bones. IMPRESSION: 1. Increased left basilar  atelectasis, aspiration pneumonitis or pneumonia. 2. Interval mild right basilar atelectasis, pneumonia or aspiration pneumonitis. Electronically Signed   By: Claudie Revering M.D.   On: 03/01/2016 07:20     12-lead ECG SR All prior EKG's in EPIC reviewed with no documented atrial fibrillation Telemetry SR only  Assessment and Plan:  1. Cryptogenic stroke The patient presents with cryptogenic stroke.  I spoke at length with the patient about monitoring for afib with either a 30 day event monitor or an implantable loop recorder.  Given his frequent palpitations, recommend first a 30 day monitor, the patient and his wife are in agreement.  Will defer to neurology in follow-up.  lso will defer retrying or further pursuit of TEE to neurology/primary team.  I discussed with the patient importance of his BP control, he reports compliance with his medicines,we also discussed smoking (all), he is working on this, tells me he quit drinking.    Carel Carrier Dyane Dustman, PA-C 03/06/2016

## 2016-03-06 NOTE — Progress Notes (Signed)
Pt educated on all DC instructions.  Made sure all contact info for appt were addressed.  Pt did not want to wait to hear back from electrophysiology about OP monitor.  Pt was given contact info for the office of this service.  All stroke education was completed with pt and fam.  They have no questions and understands.  No other problems or concerns.  Will monitor for any changes.

## 2016-03-06 NOTE — Discharge Summary (Addendum)
Physician Discharge Summary  [name]:[id number] DOB: September 22, 1970 DOA: 02/28/2016  PCP: [name], MD  Admit date: 02/28/2016 Discharge date: 03/06/2016  Admitted From: Home Disposition: Home  Recommendations for Outpatient Follow-up:  1. Follow up with PCP in 1-2 weeks 2. Please obtain BMP/CBC in one week 3. I'll schedule as outpatient for TEE with MAC  Home Health: NA Equipment/Devices:NA  Discharge Condition: Stable CODE STATUS: Full Code Diet recommendation: Diet Heart Room service appropriate? Yes; Fluid consistency: Thin Diet - low sodium heart healthy  Brief/Interim Summary: [name] an 45 y.o.malebrought in by EMS for Code Stroke On 02/28/2016. Per EMS the patient was unable to move right arm or leg and unable to follow commands during their assessment. Symptoms began as right hand numbness starting at 1600 today (02/28/2016, LKW), with patient telling his girlfrientd that he just did not feel well. Next, she noticed him dropping objects such as his keys from his right hand.  On arrival he exhibited aphasia and right hemiparesis on exam. CT head revealed no acute intracranial abnormality, with slight worsening of bilateral frontal predominant white matter disease relative to the July 2016 study, as well as chronic microvascular ischemia being noted.CTA was ordered to further evaluate. While obtaining IV access for CTA, the patient started seizing on the CT table, which resolved spontaneously after about 2 minutes. Seizure consisted of tonic clonic activity involving the left more than right side, with LOC and frothy secretions from mouth. He was postictal afterwards, unresponsive to commands and with sonorous breathing. He wasgiven 1 mg Ativan and placed on non-rebreather. Frothy secretions continued and he was felt to be an aspiration risk. He was taken back to the trauma bay for intubation.   Following intubation and sedation with Versed, he was taken  emergently back to [place] for CTA of head and neck, which revealed no LVO. 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   Discharge Diagnoses:  Active Problems:   Seizure ([place])   Malnutrition of moderate degree   CVA (cerebral vascular accident) ([place])   Palpitations   Acute CVA -Large acute left MCA territory CVA. -Nephrology consulted, recommended TEE because of history of thoracic aortic aneurysm and endoluminal graft stent. -Initially was on aspirin, stroke team recommended Plavix. -TEE was attempted on 12/20, aborted secondary to inadequate sedation, cardiology recommended TEE with MAC sedation. -I contacted cardiology asked her and Dr. Erlinda Hong, both agreed with outpatient TEE beats scheduled on the Wednesday or Thursday after the Christmas. -30 day monitor to be placed also by cardiology.  Acute encephalopathy. -This is resolved, this is likely secondary to stroke and seizures.  Seizure Patient had a single episode of seizure. Hasn't had any further episodes since initial presentation. EEG did not show any epileptiform activity.  Discharged on Keppra 500 mg twice a day.  HTN emergency. Patient had to be initially placed on a Cardene infusion. Currently off of it. Continue antihypertensives.  Home medications restarted, blood pressure is better controlled now. Patient is on multiple antihypertensives, discussed with him the importance of taking his medications.  Hx of CAD, diastolic CHF. Appears to be stable. Echocardiogram shows normal systolic function. Grade 2 diastolic dysfunction is noted. Appears to be well compensated at this time.  Thrombocytopenia Etiology is unclear. Could be due to acute illness. Platelet count is stable. Continue to monitor. There is no evidence for bleeding.  Normocytic anemia.  Hemoglobin stable. No evidence of  bleeding.  History of aortic dissection Patient is status post aortic valve replacement and graft placement at [facility] last year. Echocardiogram does show a distal flap. This was seen previously as well on a CT scan and will be considered a stable finding.  Aspiration pneumonia. Cultures are negative so far. Patient is on broad-spectrum antibiotics.  Continue antibiotics for total 7-10 days.  Acute on CKD 3, creatinine baseline is 1.8 Renal function has been stable around baseline. Likely he will benefit from ACEI or ARB to be started as outpatient if creatinine proved to be stable  Polysubstance abuse -UDS is positive for benzos and THC patient counseled. -Reported remote heavy alcohol drinking.  Discharge Instructions  Discharge Instructions    Diet - low sodium heart healthy    Complete by:  As directed    Increase activity slowly    Complete by:  As directed      Allergies as of 03/06/2016      Reactions   Imdur [isosorbide Nitrate] Other (See Comments)   headache   Tramadol Other (See Comments)   "headaches" per pt      Medication List    STOP taking these medications   aspirin EC 81 MG tablet     TAKE these medications   acetaminophen 500 MG tablet Commonly known as:  TYLENOL Take 1,000 mg by mouth 3 (three) times daily.   acetaminophen-codeine 300-30 MG tablet Commonly known as:  TYLENOL #3 Take 1 tablet by mouth every 8 (eight) hours as needed for moderate pain.   amLODipine 10 MG tablet Commonly known as:  NORVASC Take 10 mg by mouth daily.   atorvastatin 20 MG tablet Commonly known as:  LIPITOR Take 20 mg by mouth daily.   cloNIDine 0.2 MG tablet Commonly known as:  CATAPRES Take 1 tablet (0.2 mg total) by mouth 3 (three) times daily. What changed:  when to take this   clopidogrel 75 MG tablet Commonly known as:  PLAVIX Take 1 tablet (75 mg total) by mouth daily. Start taking on:  03/07/2016   DULoxetine 30 MG capsule Commonly known as:   CYMBALTA Take 30 mg by mouth daily.   hydrALAZINE 50 MG tablet Commonly known as:  APRESOLINE Take 50 mg by mouth 2 (two) times daily.   labetalol 300 MG tablet Commonly known as:  NORMODYNE Take 2 tablets (600 mg total) by mouth 2 (two) times daily. What changed:  how much to take   levETIRAcetam 500 MG tablet Commonly known as:  KEPPRA Take 1 tablet (500 mg total) by mouth 2 (two) times daily.   lidocaine 5 % Commonly known as:  LIDODERM Place 1 patch onto the skin daily. Remove & Discard patch within 12 hours or as directed by MD   meclizine 25 MG tablet Commonly known as:  ANTIVERT Take 25 mg by mouth 3 (three) times daily as needed for nausea.   omeprazole 20 MG capsule Commonly known as:  PRILOSEC Take 1 capsule (20 mg total) by mouth daily.       Allergies  Allergen Reactions  . Imdur [Isosorbide Nitrate] Other (See Comments)    headache  . Tramadol Other (See Comments)    "headaches" per pt    Consultations:  Treatment Team:   [facility], MD  Stroke team   Procedures (Echo, Carotid, EGD, Colonoscopy, ERCP)  EEG "This EEG exhibits a pattern consistent with normal sedation. No electrographic seizures noted.Please, be aware that  a normal EEG does not exclude the possibility of epilepsy."  ECHO Study Conclusions  - Left ventricle: The cavity size was normal. Wall thickness was increased in a pattern of severe LVH. Systolic function was normal. The estimated ejection fraction was in the range of 55% to 60%. Wall motion was normal; there were no regional wall motion abnormalities. Features are consistent with a pseudonormal left ventricular filling pattern, with concomitant abnormal relaxation and increased filling pressure (grade 2 diastolic dysfunction). - Aortic valve: A bioprosthesis was present and functioning normally. Valve area (VTI): 1.74 cm^2. Valve area (Vmax): 1.56 cm^2. Valve area (Vmean): 1.69 cm^2. -  Aorta: The proximal aorta is dilated. In one view what appears to be a disseection flap is seen more distally . Suggest TEE if further eval needed. - Mitral valve: There was mild to moderate regurgitation. - Left atrium: The atrium was moderately dilated.  Radiological studies:  Ct Abdomen Pelvis Wo Contrast  Result Date: 03/04/2016 CLINICAL DATA:  Abdominal pain tonight. EXAM: CT ABDOMEN AND PELVIS WITHOUT CONTRAST TECHNIQUE: Multidetector CT imaging of the abdomen and pelvis was performed following the standard protocol without IV contrast. COMPARISON:  03/21/2015 FINDINGS: Lower chest: Descending thoracic aortic dissection with endovascular stent graft, appearing grossly unchanged. The adjacent left lung base atelectasis. No acute findings are evident in the lower chest. Hepatobiliary: Cholelithiasis. No focal liver lesion is evident on unenhanced scanning. No bile duct dilatation. Pancreas: Unremarkable unenhanced appearances of the pancreas. No inflammatory changes or duct dilatation. Spleen: Normal in size without focal abnormality. Adrenals/Urinary Tract: No urinary calculi. No hydronephrosis. No focal parenchymal lesions are evident. Urinary bladder is unremarkable. Adrenals are unremarkable. Stomach/Bowel: Stomach is within normal limits. Appendix is normal. No evidence of bowel wall thickening, distention, or inflammatory changes. Vascular/Lymphatic: Upper abdominal aortic aneurysm and chronic dissection. Aortic atherosclerosis. Bilateral common iliac artery aneurysms. No retroperitoneal hematoma, but cannot assess for new dissection or luminal patency in the absence of intravenous contrast. No pathologic adenopathy is evident in the abdomen or pelvis. Reproductive: Unremarkable Other: Bland-appearing air bubbles in the subcutaneous tissues of the anterior abdominal wall likely represent injection sites. No drainable collections. No acute inflammatory changes are evident in the abdomen or  pelvis. No ascites. The benign-appearing low-attenuation right inguinal lesion continues to decrease in size, now measuring 2.9 cm in longest axis compared to 4 cm on 03/21/2015. Musculoskeletal: No significant skeletal lesions. IMPRESSION: 1. Extensive thoracic and abdominal aortic dissection with endoluminal stent graft. No conclusive interval changes, but evaluation is limited in the absence of intravenous contrast. 2. No acute inflammatory changes in the abdomen or pelvis. No bowel obstruction or perforation. No acute urologic abnormality. 3. Cholelithiasis. Electronically Signed   By: Andreas Newport M.D.   On: 03/04/2016 01:06   Ct Angio Head W Or Wo Contrast  Result Date: 02/28/2016 CLINICAL DATA:  Suspected stroke.  Seizure during the examination. EXAM: CT ANGIOGRAPHY HEAD AND NECK TECHNIQUE: Multidetector CT imaging of the head and neck was performed using the standard protocol during bolus administration of intravenous contrast. Multiplanar CT image reconstructions and MIPs were obtained to evaluate the vascular anatomy. Carotid stenosis measurements (when applicable) are obtained utilizing NASCET criteria, using the distal internal carotid diameter as the denominator. CONTRAST:  50 mL Isovue 370 IV COMPARISON:  None. FINDINGS: CTA NECK FINDINGS Aortic arch: There is an aortic arch endograft stent. There is no aneurysm or dissection of the visualized ascending aorta or aortic arch. Proximal subclavian arteries are normal. Right carotid system: The  right common carotid origin is widely patent. There is no common carotid or internal carotid artery dissection or aneurysm. No hemodynamically significant stenosis. Left carotid system: The left common carotid origin is widely patent. There is no common carotid or internal carotid artery dissection or aneurysm. No hemodynamically significant stenosis. Vertebral arteries: The vertebral system is left dominant. Both vertebral artery origins are normal. Both  vertebral arteries are normal to their confluence with the basilar artery. Skeleton: There is no bony spinal canal stenosis. No lytic or blastic lesions. Other neck: The nasopharynx is clear. The oropharynx and hypopharynx are normal. The epiglottis is normal. The supraglottic larynx, glottis and subglottic larynx are normal. No retropharyngeal collection. The parapharyngeal spaces are preserved. The parotid and submandibular glands are normal. No sialolithiasis or salivary ductal dilatation. The thyroid gland is normal. There is no cervical lymphadenopathy. Upper chest: There is bilateral upper lobe consolidation. Review of the MIP images confirms the above findings CTA HEAD FINDINGS Anterior circulation: --Intracranial internal carotid arteries: Normal. 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 Otherwise, the anterior cerebral arteries are normal. --Middle cerebral arteries: Moderate narrowing of the left M2 segment proximally. Mild irregularity of the Otherwise normal. --Posterior communicating arteries: Absent bilaterally. Posterior circulation: --Posterior cerebral arteries: There is moderate to severe narrowing of the P2 segment of the right PCA. There is mild multifocal narrowing of the left PCA. --Superior cerebellar arteries: Normal. --Basilar artery: There is basilar dolichoectasia, with luminal diameter measuring up to 5.5 mm. --Anterior inferior cerebellar arteries: Not clearly visualized, which is not uncommon. --Posterior inferior cerebellar arteries: Normal. Venous sinuses: As permitted by contrast timing, patent. Anatomic variants: None Review of the MIP images confirms the above findings IMPRESSION: 1. No intracranial arterial occlusion. 2. Moderate narrowing of the left MCA M2 segments. 3. Moderate to severe narrowing of the right PCA P2 segment. 4. Basilar artery dolichoectasia. 5. Bilateral pulmonary upper lobe consolidation, likely  aspiration or pneumonia. 6. Normal CTA of the neck. 7. Aortic atherosclerosis. Electronically Signed   By: Ulyses Jarred M.D.   On: 02/28/2016 22:19   Ct Angio Neck W Or Wo Contrast  Result Date: 02/28/2016 CLINICAL DATA:  Suspected stroke.  Seizure during the examination. EXAM: CT ANGIOGRAPHY HEAD AND NECK TECHNIQUE: Multidetector CT imaging of the head and neck was performed using the standard protocol during bolus administration of intravenous contrast. Multiplanar CT image reconstructions and MIPs were obtained to evaluate the vascular anatomy. Carotid stenosis measurements (when applicable) are obtained utilizing NASCET criteria, using the distal internal carotid diameter as the denominator. CONTRAST:  50 mL Isovue 370 IV COMPARISON:  None. FINDINGS: CTA NECK FINDINGS Aortic arch: There is an aortic arch endograft stent. There is no aneurysm or dissection of the visualized ascending aorta or aortic arch. Proximal subclavian arteries are normal. Right carotid system: The right common carotid origin is widely patent. There is no common carotid or internal carotid artery dissection or aneurysm. No hemodynamically significant stenosis. Left carotid system: The left common carotid origin is widely patent. There is no common carotid or internal carotid artery dissection or aneurysm. No hemodynamically significant stenosis. Vertebral arteries: The vertebral system is left dominant. Both vertebral artery origins are normal. Both vertebral arteries are normal to their confluence with the basilar artery. Skeleton: There is no bony spinal canal stenosis. No lytic or blastic lesions. Other neck: The nasopharynx is clear. The oropharynx and hypopharynx are normal. The epiglottis is normal. The supraglottic larynx, glottis and  subglottic larynx are normal. No retropharyngeal collection. The parapharyngeal spaces are preserved. The parotid and submandibular glands are normal. No sialolithiasis or salivary ductal  dilatation. The thyroid gland is normal. There is no cervical lymphadenopathy. Upper chest: There is bilateral upper lobe consolidation. Review of the MIP images confirms the above findings CTA HEAD FINDINGS Anterior circulation: --Intracranial internal carotid arteries: Normal. 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 Otherwise, the anterior cerebral arteries are normal. --Middle cerebral arteries: Moderate narrowing of the left M2 segment proximally. Mild irregularity of the Otherwise normal. --Posterior communicating arteries: Absent bilaterally. Posterior circulation: --Posterior cerebral arteries: There is moderate to severe narrowing of the P2 segment of the right PCA. There is mild multifocal narrowing of the left PCA. --Superior cerebellar arteries: Normal. --Basilar artery: There is basilar dolichoectasia, with luminal diameter measuring up to 5.5 mm. --Anterior inferior cerebellar arteries: Not clearly visualized, which is not uncommon. --Posterior inferior cerebellar arteries: Normal. Venous sinuses: As permitted by contrast timing, patent. Anatomic variants: None Review of the MIP images confirms the above findings IMPRESSION: 1. No intracranial arterial occlusion. 2. Moderate narrowing of the left MCA M2 segments. 3. Moderate to severe narrowing of the right PCA P2 segment. 4. Basilar artery dolichoectasia. 5. Bilateral pulmonary upper lobe consolidation, likely aspiration or pneumonia. 6. Normal CTA of the neck. 7. Aortic atherosclerosis. Electronically Signed   By: Ulyses Jarred M.D.   On: 02/28/2016 22:19   Mr Brain Wo Contrast  Result Date: 03/03/2016 CLINICAL DATA:  Seizure. EXAM: MRI HEAD WITHOUT CONTRAST TECHNIQUE: Multiplanar, multiecho pulse sequences of the brain and surrounding structures were obtained without intravenous contrast. COMPARISON:  CTA head neck 4 days ago. FINDINGS: Brain: Large area of confluent restricted diffusion  that is cortically based and extends from the posterior insula to the high frontal parietal junction. This is in superior division left MCA territory. No superimposed hemorrhage. No acute infarct elsewhere. Patchy FLAIR hyperintensity in the cerebral white matter, likely chronic microvascular ischemic gliosis. There are numerous foci of susceptibility artifact in the deep gray nuclei, brainstem, and bilateral cerebellum. There is also peripheral (cortical or sulcal) susceptibility foci. Usually this is from chronic hypertension. Given the patient's vascular disease and previous surgery these could also be from primary vasculopathy or metallic emboli. Negative for mass or hydrocephalus. Symmetric normal hippocampal formation appearance. Vascular: Arteriomegaly and vertebrobasilar tortuosity. Skull and upper cervical spine: Negative Sinuses/Orbits: Mucous retention cysts in the maxillary antra and adenoid. IMPRESSION: 1. Large acute left MCA branch infarct. 2. Chronic white matter disease and susceptibility foci, with differential discussion above. 3. Arteriomegaly and vertebrobasilar tortuosity. Electronically Signed   By: Monte Fantasia M.D.   On: 03/03/2016 13:57   Dg Chest Port 1 View  Result Date: 03/01/2016 CLINICAL DATA:  Respiratory failure. EXAM: PORTABLE CHEST 1 VIEW COMPARISON:  02/28/2016. FINDINGS: Stable enlarged cardiac silhouette, median sternotomy wires, prosthetic aortic valve and aortic stent graft. Nasogastric tube tip in the proximal stomach. Endotracheal tube in satisfactory position. Increased patchy opacity at the left lung base and interval mild patchy opacity at the right lung base. Unremarkable bones. IMPRESSION: 1. Increased left basilar atelectasis, aspiration pneumonitis or pneumonia. 2. Interval mild right basilar atelectasis, pneumonia or aspiration pneumonitis. Electronically Signed   By: Claudie Revering M.D.   On: 03/01/2016 07:20   Dg Chest Portable 1 View  Result Date:  02/28/2016 CLINICAL DATA:  Emergent intubation. EXAM: PORTABLE CHEST 1 VIEW COMPARISON:  CT of the chest 10/13/2015  FINDINGS: Endotracheal tube terminates 2.4 cm above the carina. Enteric catheters collimated off the image. Changes from median sternotomy and aortic valve placement are seen. Aortic stent graft is stable radiographically with increased density along the expected course of the distal aorta. The cardiac silhouette is enlarged. Low lung volumes. There is no evidence of focal airspace consolidation, pleural effusion or pneumothorax. Mild haziness of the lungs may represent pulmonary edema. Osseous structures are without acute abnormality. Soft tissues are grossly normal. IMPRESSION: Endotracheal tube terminates 2.4 cm above the carina. Enlargement of the cardiac silhouette. Stable appearance of the aortic stent graft with increased opacity along the lateral border of the descending aorta at the site of known aortic dissection and aneurysmal dilation. This may represent enlargement of the known aortic dissection, however this radiographic appearance may be produced by incomplete inflation of the lungs an AP technique as well. Electronically Signed   By: Fidela Salisbury M.D.   On: 02/28/2016 21:38   Dg Abd Portable 1v  Result Date: 02/29/2016 CLINICAL DATA:  OG tube placement EXAM: PORTABLE ABDOMEN - 1 VIEW COMPARISON:  None. FINDINGS: OG tube coils in the fundus of the stomach. Aortic stents noted. Nonobstructive bowel gas pattern. IMPRESSION: OG tube coils in the fundus of the stomach. Electronically Signed   By: Rolm Baptise M.D.   On: 02/29/2016 07:33   Ct Head Code Stroke W/o Cm  Result Date: 02/28/2016 CLINICAL DATA:  Code stroke. Altered mental status. Minimal unresponsiveness. EXAM: CT HEAD WITHOUT CONTRAST TECHNIQUE: Contiguous axial images were obtained from the base of the skull through the vertex without intravenous contrast. COMPARISON:  Head CT 09/27/2014 FINDINGS: Brain: No  mass lesion, intraparenchymal hemorrhage or extra-axial collection. No evidence of acute cortical infarct. There is periventricular hypoattenuation compatible with chronic microvascular disease. There are areas in both frontal lobes that appear to worsened slightly from the prior study. Vascular: No hyperdense vessel or unexpected calcification. Skull: Normal visualized skull base, calvarium and extracranial soft tissues. Sinuses/Orbits: No sinus fluid levels or advanced mucosal thickening. No mastoid effusion. Normal orbits. ASPECTS [facility] Stroke Program Early CT Score) - Ganglionic level infarction (caudate, lentiform nuclei, internal capsule, insula, M1-M3 cortex): 7 - Supraganglionic infarction (M4-M6 cortex): 3 Total score (0-10 with 10 being normal): 10 IMPRESSION: 1. No acute intracranial abnormality. Slight worsening of bilateral frontal predominant white matter disease, likely chronic microvascular ischemia. 2. ASPECTS is 10. These results were called by telephone at the time of interpretation on 02/28/2016 at 8:27 pm to Dr. Delora Fuel , who verbally acknowledged these results. Electronically Signed   By: Ulyses Jarred M.D.   On: 02/28/2016 20:27     Subjective:  Discharge Exam: Vitals:   03/05/16 2108 03/06/16 0240 03/06/16 0454 03/06/16 0500  BP: (!) 148/87 133/71 (!) 155/80   Pulse: 65 70 62   Resp: 18 18 18    Temp: 98.6 F (37 C) 98.3 F (36.8 C) 98.2 F (36.8 C)   TempSrc: Oral Oral Oral   SpO2: 98% 99% 98%   Weight:    70.4 kg (155 lb 3.2 oz)  Height:       General: Pt is alert, awake, not in acute distress Cardiovascular: RRR, S1/S2 +, no rubs, no gallops Respiratory: CTA bilaterally, no wheezing, no rhonchi Abdominal: Soft, NT, ND, bowel sounds + Extremities: no edema, no cyanosis   The results of significant diagnostics from this hospitalization (including imaging, microbiology, ancillary and laboratory) are listed below for reference.    Microbiology: Recent  Results (  from the past 240 hour(s))  Culture, respiratory (NON-Expectorated)     Status: None   Collection Time: 02/29/16  3:54 AM  Result Value Ref Range Status   Specimen Description TRACHEAL ASPIRATE  Final   Special Requests NONE  Final   Gram Stain   Final    ABUNDANT WBC PRESENT, PREDOMINANTLY PMN MODERATE GRAM POSITIVE COCCI IN PAIRS RARE GRAM NEGATIVE COCCI IN PAIRS    Culture Consistent with normal respiratory flora.  Final   Report Status 03/02/2016 FINAL  Final  Culture, blood (Routine X 2) w Reflex to ID Panel     Status: None   Collection Time: 02/29/16  4:42 AM  Result Value Ref Range Status   Specimen Description BLOOD LEFT ANTECUBITAL  Final   Special Requests IN PEDIATRIC BOTTLE 3CC  Final   Culture NO GROWTH 5 DAYS  Final   Report Status 03/05/2016 FINAL  Final  Culture, blood (Routine X 2) w Reflex to ID Panel     Status: None   Collection Time: 02/29/16  4:55 AM  Result Value Ref Range Status   Specimen Description BLOOD LEFT ANTECUBITAL  Final   Special Requests IN PEDIATRIC BOTTLE 3CC  Final   Culture NO GROWTH 5 DAYS  Final   Report Status 03/05/2016 FINAL  Final  MRSA PCR Screening     Status: None   Collection Time: 02/29/16  5:17 AM  Result Value Ref Range Status   MRSA by PCR NEGATIVE NEGATIVE Final    Comment:        The GeneXpert MRSA Assay (FDA approved for NASAL specimens only), is one component of a comprehensive MRSA colonization surveillance program. It is not intended to diagnose MRSA infection nor to guide or monitor treatment for MRSA infections.   Culture, Urine     Status: None   Collection Time: 02/29/16  5:44 AM  Result Value Ref Range Status   Specimen Description URINE, RANDOM  Final   Special Requests NONE  Final   Culture NO GROWTH  Final   Report Status 03/01/2016 FINAL  Final     Labs: BNP (last 3 results) No results for input(s): BNP in the last 8760 hours. Basic Metabolic Panel:  Recent Labs Lab 02/29/16 1049  02/29/16 1648 03/01/16 0856 03/02/16 0451 03/03/16 0455 03/04/16 0512 03/06/16 0550  NA  --   --  139 138 139 136 137  K  --   --  3.9 3.9 3.5 3.0* 4.1  CL  --   --  112* 110 109 108 107  CO2  --   --  22 21* 23 23 23   GLUCOSE  --   --  99 94 129* 111* 97  BUN  --   --  20 13 10 11 13   CREATININE  --   --  2.06* 1.95* 1.69* 1.77* 1.87*  CALCIUM  --   --  7.9* 8.2* 8.6* 8.5* 9.0  MG 2.1 2.0 2.2 1.9 2.0  --   --   PHOS 2.9 4.1 3.3 3.0 3.3  --   --    Liver Function Tests:  Recent Labs Lab 02/28/16 2007 03/03/16 0455 03/04/16 0512  AST 22 29 31   ALT 21 19 23   ALKPHOS 97 64 62  BILITOT 0.8 1.1 0.8  PROT 6.8 5.9* 6.2*  ALBUMIN 3.4* 2.7* 2.6*    Recent Labs Lab 03/03/16 0455  LIPASE 13   No results for input(s): AMMONIA in the last 168 hours. CBC:  Recent  Labs Lab 02/28/16 2007  03/01/16 0856 03/02/16 0451 03/03/16 0455 03/03/16 1640 03/04/16 0512 03/06/16 0550  WBC 6.3  < > 11.2* 11.0* 7.0  --  5.9 5.4  NEUTROABS 2.4  --   --   --   --   --   --   --   HGB 13.4  < > 11.6* 11.5* 11.7*  --  11.4* 11.8*  HCT 40.3  < > 35.8* 34.2* 35.1*  --  34.8* 35.9*  MCV 92.4  < > 94.7 92.7 92.1  --  91.8 92.1  PLT 115*  < > 77* 89* 80* 92* 89* 118*  < > = values in this interval not displayed. Cardiac Enzymes: No results for input(s): CKTOTAL, CKMB, CKMBINDEX, TROPONINI in the last 168 hours. BNP: Invalid input(s): POCBNP CBG:  Recent Labs Lab 02/29/16 1940 02/29/16 2332 03/01/16 0332 03/01/16 0805 03/01/16 1211  GLUCAP 94 99 110* 116* 92   D-Dimer  Recent Labs  03/03/16 1640  DDIMER >20.00*   Hgb A1c  Recent Labs  03/04/16 0512  HGBA1C 5.3   Lipid Profile  Recent Labs  03/04/16 0512  CHOL 178  HDL 30*  LDLCALC 124*  TRIG 119  CHOLHDL 5.9   Thyroid function studies No results for input(s): TSH, T4TOTAL, T3FREE, THYROIDAB in the last 72 hours.  Invalid input(s): FREET3 Anemia work up No results for input(s): VITAMINB12, FOLATE, FERRITIN,  TIBC, IRON, RETICCTPCT in the last 72 hours. Urinalysis    Component Value Date/Time   COLORURINE STRAW (A) 02/29/2016 0030   APPEARANCEUR CLEAR 02/29/2016 0030   LABSPEC 1.016 02/29/2016 0030   PHURINE 7.0 02/29/2016 0030   GLUCOSEU NEGATIVE 02/29/2016 0030   HGBUR MODERATE (A) 02/29/2016 0030   HGBUR negative 02/18/2009 0901   BILIRUBINUR NEGATIVE 02/29/2016 0030   KETONESUR NEGATIVE 02/29/2016 0030   PROTEINUR 100 (A) 02/29/2016 0030   UROBILINOGEN 0.2 01/02/2015 1945   NITRITE NEGATIVE 02/29/2016 0030   LEUKOCYTESUR NEGATIVE 02/29/2016 0030   Sepsis Labs Invalid input(s): PROCALCITONIN,  WBC,  LACTICIDVEN Microbiology Recent Results (from the past 240 hour(s))  Culture, respiratory (NON-Expectorated)     Status: None   Collection Time: 02/29/16  3:54 AM  Result Value Ref Range Status   Specimen Description TRACHEAL ASPIRATE  Final   Special Requests NONE  Final   Gram Stain   Final    ABUNDANT WBC PRESENT, PREDOMINANTLY PMN MODERATE GRAM POSITIVE COCCI IN PAIRS RARE GRAM NEGATIVE COCCI IN PAIRS    Culture Consistent with normal respiratory flora.  Final   Report Status 03/02/2016 FINAL  Final  Culture, blood (Routine X 2) w Reflex to ID Panel     Status: None   Collection Time: 02/29/16  4:42 AM  Result Value Ref Range Status   Specimen Description BLOOD LEFT ANTECUBITAL  Final   Special Requests IN PEDIATRIC BOTTLE 3CC  Final   Culture NO GROWTH 5 DAYS  Final   Report Status 03/05/2016 FINAL  Final  Culture, blood (Routine X 2) w Reflex to ID Panel     Status: None   Collection Time: 02/29/16  4:55 AM  Result Value Ref Range Status   Specimen Description BLOOD LEFT ANTECUBITAL  Final   Special Requests IN PEDIATRIC BOTTLE 3CC  Final   Culture NO GROWTH 5 DAYS  Final   Report Status 03/05/2016 FINAL  Final  MRSA PCR Screening     Status: None   Collection Time: 02/29/16  5:17 AM  Result Value Ref Range  Status   MRSA by PCR NEGATIVE NEGATIVE Final    Comment:         The GeneXpert MRSA Assay (FDA approved for NASAL specimens only), is one component of a comprehensive MRSA colonization surveillance program. It is not intended to diagnose MRSA infection nor to guide or monitor treatment for MRSA infections.   Culture, Urine     Status: None   Collection Time: 02/29/16  5:44 AM  Result Value Ref Range Status   Specimen Description URINE, RANDOM  Final   Special Requests NONE  Final   Culture NO GROWTH  Final   Report Status 03/01/2016 FINAL  Final     Time coordinating discharge: Over 30 minutes  SIGNED:   Birdie Hopes, MD  [facility] 03/06/2016, 12:37 PM Pager   If 7PM-7AM, please contact night-coverage [email or web address] Password [email or web address]

## 2016-03-06 NOTE — Progress Notes (Signed)
STROKE TEAM PROGRESS NOTE   SUBJECTIVE (INTERVAL HISTORY) No acute event over night. EP seeing pt this am and recommend 30 day cardiac event monitoring. TEE no slot for him today. And will do as outpt.    OBJECTIVE Temp:  [98.1 F (36.7 C)-98.6 F (37 C)] 98.2 F (36.8 C) (12/22 0454) Pulse Rate:  [62-71] 62 (12/22 0454) Cardiac Rhythm: Normal sinus rhythm (12/22 0700) Resp:  [17-18] 18 (12/22 0454) BP: (125-155)/(69-87) 155/80 (12/22 0454) SpO2:  [95 %-99 %] 98 % (12/22 0454) Weight:  [155 lb 3.2 oz (70.4 kg)] 155 lb 3.2 oz (70.4 kg) (12/22 0500)  CBC:  Recent Labs Lab 02/28/16 2007  03/04/16 0512 03/06/16 0550  WBC 6.3  < > 5.9 5.4  NEUTROABS 2.4  --   --   --   HGB 13.4  < > 11.4* 11.8*  HCT 40.3  < > 34.8* 35.9*  MCV 92.4  < > 91.8 92.1  PLT 115*  < > 89* 118*  < > = values in this interval not displayed.  Basic Metabolic Panel:   Recent Labs Lab 03/02/16 0451 03/03/16 0455 03/04/16 0512 03/06/16 0550  NA 138 139 136 137  K 3.9 3.5 3.0* 4.1  CL 110 109 108 107  CO2 21* 23 23 23   GLUCOSE 94 129* 111* 97  BUN 13 10 11 13   CREATININE 1.95* 1.69* 1.77* 1.87*  CALCIUM 8.2* 8.6* 8.5* 9.0  MG 1.9 2.0  --   --   PHOS 3.0 3.3  --   --     Lipid Panel:     Component Value Date/Time   CHOL 178 03/04/2016 0512   TRIG 119 03/04/2016 0512   HDL 30 (L) 03/04/2016 0512   CHOLHDL 5.9 03/04/2016 0512   VLDL 24 03/04/2016 0512   LDLCALC 124 (H) 03/04/2016 0512   HgbA1c:  Lab Results  Component Value Date   HGBA1C 5.3 03/04/2016   Urine Drug Screen:     Component Value Date/Time   LABOPIA NONE DETECTED 02/29/2016 0030   COCAINSCRNUR NONE DETECTED 02/29/2016 0030   COCAINSCRNUR NEG 01/16/2009 2229   LABBENZ POSITIVE (A) 02/29/2016 0030   LABBENZ NEG 01/16/2009 2229   AMPHETMU NONE DETECTED 02/29/2016 0030   THCU POSITIVE (A) 02/29/2016 0030   LABBARB NONE DETECTED 02/29/2016 0030      IMAGING I have personally reviewed the radiological images below and  agree with the radiology interpretations.  Ct Head Code Stroke W/o Cm 02/28/2016 1. No acute intracranial abnormality. Slight worsening of bilateral frontal predominant white matter disease, likely chronic microvascular ischemia. 2. ASPECTS is 10.   Ct Angio Head W Or Wo Contrast Ct Angio Neck W Or Wo Contrast 02/28/2016 1. No intracranial arterial occlusion. 2. Moderate narrowing of the left MCA M2 segments. 3. Moderate to severe narrowing of the right PCA P2 segment. 4. Basilar artery dolichoectasia. 5. Bilateral pulmonary upper lobe consolidation, likely aspiration or pneumonia. 6. Normal CTA of the neck. 7. Aortic atherosclerosis.    Mr Brain Wo Contrast 03/03/2016 1. Large acute left MCA branch infarct. 2. Chronic white matter disease and susceptibility foci, with differential discussion above. 3. Arteriomegaly and vertebrobasilar tortuosity.   2D Echocardiogram  - Left ventricle: The cavity size was normal. Wall thickness was increased in a pattern of severe LVH. Systolic function was normal. The estimated ejection fraction was in the range of 55% to 60%. Wall motion was normal; there were no regional wall motion abnormalities. Features are consistent with a  pseudonormal left ventricular filling pattern, with concomitant abnormal relaxation and increased filling pressure (grade 2 diastolic dysfunction). - Aortic valve: A bioprosthesis was present and functioning normally. Valve area (VTI): 1.74 cm^2. Valve area (Vmax): 1.56 cm^2. Valve area (Vmean): 1.69 cm^2. - Aorta: The proximal aorta is dilated. In one view what appears to be a disseection flap is seen more distally . Suggest TEE if further eval needed. - Mitral valve: There was mild to moderate regurgitation. - Left atrium: The atrium was moderately dilated.  LE venous doppler - No evidence of deep vein thrombosis or baker's cysts bilaterally.    PHYSICAL EXAM  Temp:  [98.1 F (36.7 C)-98.6 F (37 C)] 98.2 F (36.8 C) (12/22  0454) Pulse Rate:  [62-71] 62 (12/22 0454) Resp:  [17-18] 18 (12/22 0454) BP: (125-155)/(69-87) 155/80 (12/22 0454) SpO2:  [95 %-99 %] 98 % (12/22 0454) Weight:  [155 lb 3.2 oz (70.4 kg)] 155 lb 3.2 oz (70.4 kg) (12/22 0500)  General - Well nourished, well developed, in no apparent distress.  Ophthalmologic - Fundi not visualized due to eye movement.  Cardiovascular - Regular rate and rhythm.  Mental Status -  Level of arousal and orientation to time, place, and person were intact. Language including expression, naming, repetition, comprehension was assessed and found intact. Fund of Knowledge was assessed and was intact.  Cranial Nerves II - XII - II - Visual field intact OU. III, IV, VI - Extraocular movements intact. V - Facial sensation intact bilaterally. VII - Facial movement intact bilaterally. VIII - Hearing & vestibular intact bilaterally. X - Palate elevates symmetrically. XI - Chin turning & shoulder shrug intact bilaterally. XII - Tongue protrusion intact.  Motor Strength - The patient's strength was normal in all extremities and pronator drift was absent.  Bulk was normal and fasciculations were absent.   Motor Tone - Muscle tone was assessed at the neck and appendages and was normal.  Reflexes - The patient's reflexes were 1+ in all extremities and he had no pathological reflexes.  Sensory - Light touch, temperature/pinprick were assessed and were symmetrical.    Coordination - The patient had normal movements in the hands and feet with no ataxia or dysmetria.  Tremor was absent.  Gait and Station - deferred.   ASSESSMENT/PLAN Mr. DAE HIGHLEY is a 45 y.o. 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 In ED, had seizure and subsequent cardiac arrest. CTA without clear LVO. Intubated and started on  Keppra. Post extubation had mild right hemiparesis and word finding difficulty. MRI showed left brain embolic stroke. He was not considered for IV TPA due to seizure at onset with non-stroke presentation.  Stroke:  Moderate left MCA branch infarct, embolic secondary to unknown source. Concerning for paroxysmal afib   Resultant  Deficit resolved  Code stroke CT no acute abnormality   CT angiogram head and neck no LVO. Moderate narrowing left M2 and right P2.  MRI  moderate left MCA branch infarct  2D Echo  EF 55-60%. No source of embolus   Lower extremity Doppler negative for DVT  Out pt TEE will be arranged by cardiology  EP recommend 30 day cardiac event monitoring as outpt   LDL 124  HgbA1c 5.3  Heparin 5000 units sq tid for VTE prophylaxis Diet Heart Room service appropriate? Yes; Fluid consistency: Thin Diet - low sodium heart healthy  aspirin 81 mg daily prior to admission, now on aspirin 325 mg daily. Recommend to change ASA to plavix for further stroke prevention.  Patient counseled to be compliant with his antithrombotic medications  Ongoing aggressive stroke risk factor management  Therapy recommendations:  Home health PT  Disposition:  Home today  Seizure   Witness GTC in ED  EEG without epileptiform activity   No further seizure   Continue Keppra 500 mg twice a day  Hx of aortic dissection s/p repair   Extensive surgical repair in 04/2014  Transient cardiomyopathy  Currently EF 55-60%   Hypertensive urgency  Initially treated with Cardene, now off  Stable now  On multiple BP meds  Educated on BP meds compliance and monitoring at home  Long-term BP goal normotensive  Hyperlipidemia  Home meds:  Lipitor 20  LDL 124, goal < 70  Resumed statin   Continue statin at discharge  Tobacco abuse  Current smoker  Uses chewing tobacco  Smoking cessation counseling provided  Pt is willing to quit  Other Stroke Risk  Factors  Marijuana use, urine drug screen positive for THC this admission, patient counseled of stroke risk and advised to stop use   Family hx stroke (maternal aunt)  Coronary artery disease  Chronic diastolic and systolic congestive heart failure  Valvular heart disease  Other Active Problems  Thrombocytopenia  Normocytic anemia  Aspiration pneumonia - on vanco and zosyn  Acute on chronic kidney disease stage III  Hospital day # 7  Neurology will sign off. Please call with questions. Pt will follow up with Dr. Erlinda Hong at [facility] in about 6 weeks. Thanks for the consult.   Rosalin Hawking, MD PhD Stroke Neurology 03/06/2016 12:37 PM    To contact Stroke Continuity provider, please refer to [email or web address] After hours, contact General Neurology

## 2016-03-06 NOTE — Progress Notes (Signed)
Occupational Therapy Treatment Patient Details Name: [name] MRN: [id number] DOB: [birth year] Today's Date: 03/06/2016    History of present illness Patient is a 45 year old male admitted 02/28/16 for confusion, R sideed weakness, and altered mental status. Patient had a seizure and shallow respirations while in CT. Intubated 02/28/16, extubated 03/01/16. PMH includes anxiety, CAD, aortic dissection and repair, CHF, CKD, HTN, and HLD. MRI on 12/19 + for large L MCA CVA.   OT comments  Educated pt on fine motor coordination, sensory changes, and theraputty exercises for R hand. Provided pt with red theraputty. Continue to recommend outpatient OT for follow up to address R hand coordination/sensation changes. No further acute OT needs identified; will sign off at this time. Please re-consult if needs change.   Follow Up Recommendations  Outpatient OT;Supervision - Intermittent    Equipment Recommendations  None recommended by OT    Recommendations for Other Services      Precautions / Restrictions Restrictions Weight Bearing Restrictions: No       Mobility Bed Mobility Overal bed mobility: Modified Independent                Transfers                      Balance Overall balance assessment: No apparent balance deficits (not formally assessed)                                 ADL                                                Vision                     Perception     Praxis      Cognition   Behavior During Therapy: WFL for tasks assessed/performed Overall Cognitive Status: Within Functional Limits for tasks assessed                       Extremity/Trunk Assessment               Exercises Other Exercises Other Exercises: Educated pt on fine motor coordination exercises and theraputty exercises. Pt able to return demo use of theraputty. Provided pt with red theraputty. Other Exercises:  Educated pt on sensation changes with RUE; being careful around hot/cold and sharp objects. Other Exercises: Encouraged functional use of R UE. Other Exercises: Educated pt on desentization techniques for R hand; rubbing with lotion and different textures.   Shoulder Instructions       General Comments      Pertinent Vitals/ Pain       Pain Assessment: No/denies pain  Home Living                                          Prior Functioning/Environment              Frequency           Progress Toward Goals  OT Goals(current goals can now be found in the care plan section)  Progress towards OT goals: Goals met/education completed, patient discharged from OT  Acute Rehab OT Goals Patient Stated Goal: return to independence, work on cars  OT Goal Formulation: All assessment and education complete, DC therapy  Plan Discharge plan remains appropriate;All goals met and education completed, patient discharged from OT services    Co-evaluation                 End of Session Equipment Utilized During Treatment: Other (comment) (theraputty)   Activity Tolerance Patient tolerated treatment well   Patient Left in bed;with family/visitor present   Nurse Communication Other (comment) (pt needs outpt OT)        Time: 7639-4320 OT Time Calculation (min): 13 min  Charges: OT General Charges $OT Visit: 1 Procedure OT Treatments $Therapeutic Exercise: 8-22 mins  Binnie Kand M.S., OTR/L Pager: [phone]  03/06/2016, 1:57 PM

## 2016-03-10 ENCOUNTER — Other Ambulatory Visit: Payer: Self-pay | Admitting: Student

## 2016-03-10 ENCOUNTER — Telehealth: Payer: Self-pay | Admitting: Student

## 2016-03-10 NOTE — Telephone Encounter (Signed)
   Received a staff message from the Stroke Team regarding this patient needing a TEE with his recent CVA. This was attempted in the hospital and unsuccessfully performed with moderate sedation. Assistance of anesthesia recommended for when this was rescheduled.   I called the patient and reviewed the procedure.   After careful review of history and examination, the risks and benefits of transesophageal echocardiogram have been explained including risks of esophageal damage, perforation (1:10,000 risk), bleeding, pharyngeal hematoma as well as other potential complications associated with conscious sedation including aspiration, arrhythmia, respiratory failure and death. Alternatives to treatment were discussed, questions were answered. Patient is willing to proceed.   This has been scheduled for 03/12/2016 with Dr. Sallyanne Kuster (scheduled with anesthesia) at 0800. The patient was informed to arrive to Short Stay at [facility] by 0630.  Signed, Erma Heritage, PA-C 03/10/2016, 12:20 PM

## 2016-03-11 ENCOUNTER — Telehealth: Payer: Self-pay | Admitting: Physician Assistant

## 2016-03-11 ENCOUNTER — Other Ambulatory Visit: Payer: Self-pay | Admitting: Physician Assistant

## 2016-03-11 DIAGNOSIS — I639 Cerebral infarction, unspecified: Secondary | ICD-10-CM

## 2016-03-11 NOTE — Telephone Encounter (Signed)
I was helping to cover the inbox of Birdie Sons today. Yesterday, Bernerd Pho PA-C was covering. Per her note, she "called and spoke with the patient, reviewing the procedure and obtaining consent. He is in agreement to proceed. This has been scheduled for 03/12/2016 at 0800. Informed the patient to arrive to Short Stay by 0630."   The neuro message also requested OP cardiac monitor. I spoke with Tommye Standard NP to clarify their recommendation based on recent consult note. Per her discussion with Dr. Caryl Comes, they recommend an event monitor first rather than a loop. The patient had reported very frequent palpitations and they feel they may be able to catch something on the monitor. I placed order for this. I also sent message to [place] to help arrange and to call patient with event monitor pickup information.  Dayna Dunn PA-C

## 2016-03-12 ENCOUNTER — Encounter (HOSPITAL_COMMUNITY)
Admission: RE | Disposition: A | Discharge status: Home / Self Care | Payer: Self-pay | Source: Ambulatory Visit | Attending: Cardiovascular Disease

## 2016-03-12 ENCOUNTER — Ambulatory Visit (HOSPITAL_COMMUNITY)
Admission: RE | Admit: 2016-03-12 | Discharge: 2016-03-12 | Disposition: A | Discharge status: Home / Self Care | Payer: Medicare Other | Source: Ambulatory Visit | Attending: Cardiovascular Disease | Admitting: Cardiovascular Disease

## 2016-03-12 ENCOUNTER — Ambulatory Visit (HOSPITAL_COMMUNITY): Payer: Medicare Other | Admitting: Certified Registered"

## 2016-03-12 ENCOUNTER — Encounter (HOSPITAL_COMMUNITY): Payer: Self-pay | Admitting: *Deleted

## 2016-03-12 ENCOUNTER — Ambulatory Visit (HOSPITAL_BASED_OUTPATIENT_CLINIC_OR_DEPARTMENT_OTHER): Payer: Medicare Other

## 2016-03-12 DIAGNOSIS — R569 Unspecified convulsions: Secondary | ICD-10-CM | POA: Insufficient documentation

## 2016-03-12 DIAGNOSIS — I639 Cerebral infarction, unspecified: Secondary | ICD-10-CM | POA: Diagnosis not present

## 2016-03-12 DIAGNOSIS — I5042 Chronic combined systolic (congestive) and diastolic (congestive) heart failure: Secondary | ICD-10-CM | POA: Insufficient documentation

## 2016-03-12 DIAGNOSIS — I130 Hypertensive heart and chronic kidney disease with heart failure and stage 1 through stage 4 chronic kidney disease, or unspecified chronic kidney disease: Secondary | ICD-10-CM | POA: Diagnosis not present

## 2016-03-12 DIAGNOSIS — I340 Nonrheumatic mitral (valve) insufficiency: Secondary | ICD-10-CM

## 2016-03-12 DIAGNOSIS — I429 Cardiomyopathy, unspecified: Secondary | ICD-10-CM | POA: Insufficient documentation

## 2016-03-12 DIAGNOSIS — N189 Chronic kidney disease, unspecified: Secondary | ICD-10-CM | POA: Insufficient documentation

## 2016-03-12 DIAGNOSIS — Z79899 Other long term (current) drug therapy: Secondary | ICD-10-CM | POA: Insufficient documentation

## 2016-03-12 DIAGNOSIS — K219 Gastro-esophageal reflux disease without esophagitis: Secondary | ICD-10-CM | POA: Insufficient documentation

## 2016-03-12 DIAGNOSIS — E785 Hyperlipidemia, unspecified: Secondary | ICD-10-CM | POA: Diagnosis not present

## 2016-03-12 DIAGNOSIS — I10 Essential (primary) hypertension: Secondary | ICD-10-CM | POA: Diagnosis not present

## 2016-03-12 DIAGNOSIS — Z7982 Long term (current) use of aspirin: Secondary | ICD-10-CM | POA: Diagnosis not present

## 2016-03-12 DIAGNOSIS — I2510 Atherosclerotic heart disease of native coronary artery without angina pectoris: Secondary | ICD-10-CM | POA: Insufficient documentation

## 2016-03-12 DIAGNOSIS — F17210 Nicotine dependence, cigarettes, uncomplicated: Secondary | ICD-10-CM | POA: Insufficient documentation

## 2016-03-12 HISTORY — PX: TEE WITHOUT CARDIOVERSION: SHX5443

## 2016-03-12 SURGERY — ECHOCARDIOGRAM, TRANSESOPHAGEAL
Anesthesia: Monitor Anesthesia Care

## 2016-03-12 MED ORDER — LIDOCAINE HCL (CARDIAC) 20 MG/ML IV SOLN
INTRAVENOUS | Status: DC | PRN
  Administered 2016-03-12: 100 mg via INTRATRACHEAL

## 2016-03-12 MED ORDER — PROPOFOL 500 MG/50ML IV EMUL
INTRAVENOUS | Status: DC | PRN
  Administered 2016-03-12: 125 ug/kg/min via INTRAVENOUS

## 2016-03-12 MED ORDER — SODIUM CHLORIDE 0.9 % IV SOLN
INTRAVENOUS | Status: DC
  Administered 2016-03-12: 08:00:00 via INTRAVENOUS

## 2016-03-12 MED ORDER — PROPOFOL 10 MG/ML IV BOLUS
INTRAVENOUS | Status: DC | PRN
  Administered 2016-03-12: 70 mg via INTRAVENOUS

## 2016-03-12 MED ORDER — BUTAMBEN-TETRACAINE-BENZOCAINE 2-2-14 % EX AERO
INHALATION_SPRAY | CUTANEOUS | Status: DC | PRN
  Administered 2016-03-12: 2 via TOPICAL

## 2016-03-12 NOTE — Discharge Instructions (Signed)
TEE  YOU HAD AN CARDIAC PROCEDURE TODAY: Refer to the procedure report and other information in the discharge instructions given to you for any specific questions about what was found during the examination. If this information does not answer your questions, please call [facility] office at [phone] to clarify.   DIET: Your first meal following the procedure should be a light meal and then it is ok to progress to your normal diet. A half-sandwich or bowl of soup is an example of a good first meal. Heavy or fried foods are harder to digest and may make you feel nauseous or bloated. Drink plenty of fluids but you should avoid alcoholic beverages for 24 hours. If you had a esophageal dilation, please see attached instructions for diet.   ACTIVITY: Your care partner should take you home directly after the procedure. You should plan to take it easy, moving slowly for the rest of the day. You can resume normal activity the day after the procedure however YOU SHOULD NOT DRIVE, use power tools, machinery or perform tasks that involve climbing or major physical exertion for 24 hours (because of the sedation medicines used during the test).   SYMPTOMS TO REPORT IMMEDIATELY: A cardiologist can be reached at any hour. Please call [phone] for any of the following symptoms:  Vomiting of blood or coffee ground material  New, significant abdominal pain  New, significant chest pain or pain under the shoulder blades  Painful or persistently difficult swallowing  New shortness of breath  Black, tarry-looking or red, bloody stools  FOLLOW UP:  Please also call with any specific questions about appointments or follow up tests.   Monitored Anesthesia Care, Care After These instructions provide you with information about caring for yourself after your procedure. Your health care provider may also give you more specific instructions. Your treatment has been planned according to current medical  practices, but problems sometimes occur. Call your health care provider if you have any problems or questions after your procedure. What can I expect after the procedure? After your procedure, it is common to:  Feel sleepy for several hours.  Feel clumsy and have poor balance for several hours.  Feel forgetful about what happened after the procedure.  Have poor judgment for several hours.  Feel nauseous or vomit.  Have a sore throat if you had a breathing tube during the procedure. Follow these instructions at home: For at least 24 hours after the procedure:   Do not:  Participate in activities in which you could fall or become injured.  Drive.  Use heavy machinery.  Drink alcohol.  Take sleeping pills or medicines that cause drowsiness.  Make important decisions or sign legal documents.  Take care of children on your own.  Rest. Eating and drinking  Follow the diet that is recommended by your health care provider.  If you vomit, drink water, juice, or soup when you can drink without vomiting.  Make sure you have little or no nausea before eating solid foods. General instructions  Have a responsible adult stay with you until you are awake and alert.  Take over-the-counter and prescription medicines only as told by your health care provider.  If you smoke, do not smoke without supervision.  Keep all follow-up visits as told by your health care provider. This is important. Contact a health care provider if:  You keep feeling nauseous or you keep vomiting.  You feel light-headed.  You develop a rash.  You have a fever.  Get help right away if:  You have trouble breathing. This information is not intended to replace advice given to you by your health care provider. Make sure you discuss any questions you have with your health care provider. Document Released: 06/23/2015 Document Revised: 10/23/2015 Document Reviewed: 06/23/2015 Elsevier Interactive Patient  Education  2017 [employer].

## 2016-03-12 NOTE — Anesthesia Preprocedure Evaluation (Signed)
Anesthesia Evaluation  Patient identified by MRN, date of birth, ID band Patient awake    Reviewed: Allergy & Precautions, NPO status , Patient's Chart, lab work & pertinent test results  Airway Mallampati: I  TM Distance: >3 FB Neck ROM: Full    Dental   Pulmonary Current Smoker,    Pulmonary exam normal        Cardiovascular hypertension, Pt. on medications Normal cardiovascular exam+ Valvular Problems/Murmurs AI      Neuro/Psych    GI/Hepatic GERD  Medicated and Controlled,  Endo/Other    Renal/GU      Musculoskeletal   Abdominal   Peds  Hematology   Anesthesia Other Findings   Reproductive/Obstetrics                             Anesthesia Physical Anesthesia Plan  ASA: III  Anesthesia Plan: MAC   Post-op Pain Management:    Induction: Intravenous  Airway Management Planned: Simple Face Mask  Additional Equipment:   Intra-op Plan:   Post-operative Plan:   Informed Consent: I have reviewed the patients History and Physical, chart, labs and discussed the procedure including the risks, benefits and alternatives for the proposed anesthesia with the patient or authorized representative who has indicated his/her understanding and acceptance.     Plan Discussed with: CRNA and Surgeon  Anesthesia Plan Comments:         Anesthesia Quick Evaluation

## 2016-03-12 NOTE — Anesthesia Procedure Notes (Signed)
Procedure Name: MAC Date/Time: 03/12/2016 8:08 AM Performed by: Lance Coon Pre-anesthesia Checklist: Patient identified, Emergency Drugs available, Suction available, Patient being monitored and Timeout performed Patient Re-evaluated:Patient Re-evaluated prior to inductionOxygen Delivery Method: Nasal cannula Intubation Type: IV induction

## 2016-03-12 NOTE — H&P (View-Only) (Signed)
PULMONARY / CRITICAL CARE MEDICINE   Name: [name] MRN: [id number] DOB: [birth year]    ADMISSION DATE:  02/28/2016  CHIEF COMPLAINT:  Confusion, headache, and seizure  HISTORY OF PRESENT ILLNESS:   [name] is a 45 y/o man with a complex cardiovascular history including aortic dissection s/p repair and reconstruction, systolic and diastolic HF, HTN who presented to the ED with confusion, R sided weakness, and changes in mental status as a code stroke. He had a stat head CT and had a witnessed seizure event which resolved spontaneously, although he was treated with ativan. Subsequently, he had depressed mental status and respiratory drive, and was intubated for airway protection. PCCM was called for admission.  PAST MEDICAL HISTORY :  He  has a past medical history of Adenomatous colon polyp (08/2015); Anxiety; Aortic disease ([place]); Aortic dissection ([facility]); CAD (coronary artery disease); Cardiomyopathy ([place]); Chronic abdominal pain; Chronic combined systolic and diastolic congestive heart failure ([place]); Chronic sinusitis; CKD (chronic kidney disease); Colon polyps (09/11/2015); DDD (degenerative disc disease), lumbar; Descending thoracic aortic aneurysm ([place]); Dissecting aneurysm of thoracic aorta ([place]); ETOH abuse; GERD (gastroesophageal reflux disease); Headache(784.0); Heart murmur; Hemorrhoid thrombosis; History of echocardiogram; History of medication noncompliance; HYPERLIPIDEMIA; Hypertension; INGUINAL HERNIA; Pneumonia (~ 2013); Renal insufficiency; Tobacco abuse; and Valvular heart disease.  PAST SURGICAL HISTORY: He  has a past surgical history that includes Ankle surgery (Bilateral); Inguinal hernia repair (Right, ~ 1996); Foot fracture surgery (Bilateral, 2004-2010); left heart catheterization with coronary angiogram (N/A, 06/21/2014); Aortic valve surgery (09/2014); and Hemorrhoid surgery (N/A, 06/15/2015).  Allergies  Allergen Reactions  . Imdur [Isosorbide Nitrate] Other (See  Comments)    headache  . Tramadol Other (See Comments)    "headaches" per pt    No current facility-administered medications on file prior to encounter.    Current Outpatient Prescriptions on File Prior to Encounter  Medication Sig  . acetaminophen (TYLENOL) 500 MG tablet Take 1,000 mg by mouth 3 (three) times daily.  [name] acetaminophen-codeine (TYLENOL #3) 300-30 MG tablet Take 1 tablet by mouth every 8 (eight) hours as needed for moderate pain. (Patient not taking: Reported on 10/29/2015)  . amLODipine (NORVASC) 10 MG tablet Take 10 mg by mouth daily.  [name] aspirin 81 MG tablet Take 81 mg by mouth daily.  [name] atorvastatin (LIPITOR) 20 MG tablet Take 20 mg by mouth daily.  . cloNIDine (CATAPRES) 0.2 MG tablet Take 1 tablet (0.2 mg total) by mouth 3 (three) times daily.  . hydrALAZINE (APRESOLINE) 50 MG tablet Take 50 mg by mouth 2 (two) times daily.  [name] labetalol (NORMODYNE) 300 MG tablet Take 2 tablets (600 mg total) by mouth 2 (two) times daily.  . Multiple Vitamins-Minerals (MULTIVITAMIN WITH MINERALS) tablet Take 1 tablet by mouth daily.  [name] omeprazole (PRILOSEC) 20 MG capsule Take 1 capsule (20 mg total) by mouth daily.  . [DISCONTINUED] nitroGLYCERIN (NITRODUR - DOSED IN MG/24 HR) 0.4 mg/hr patch Place 1 patch (0.4 mg total) onto the skin daily.    FAMILY HISTORY:  Unable to obtain  SOCIAL HISTORY: He  reports that he has been smoking Cigarettes.  He has a 5.75 pack-year smoking history. He has quit using smokeless tobacco. His smokeless tobacco use included Chew. He reports that he uses drugs, including Marijuana. He reports that he does not drink alcohol.  REVIEW OF SYSTEMS:   Unable to obtain 2/2 intubated state.  SUBJECTIVE:  Young adult man intubated and sedated.  VITAL SIGNS: BP (!) 167/103   Pulse 70  Temp 97.7 F (36.5 C) (Axillary)   Resp 22   Wt 154 lb 5.2 oz (70 kg)   SpO2 100%   BMI 21.52 kg/m   HEMODYNAMICS:    VENTILATOR SETTINGS: Vent Mode: PRVC FiO2  (%):  [60 %-100 %] 100 % Set Rate:  [16 bmp-22 bmp] 22 bmp Vt Set:  [620 mL] 620 mL PEEP:  [5 cmH20-10 cmH20] 5 cmH20 Plateau Pressure:  [20 cmH20-27 cmH20] 20 cmH20  INTAKE / OUTPUT: No intake/output data recorded.  PHYSICAL EXAMINATION: General:  Intubated and sedated Neuro:  Sedated, no spontaneous activity HEENT:  ETT in place Cardiovascular:  RRR, normal S1/S2 Lungs:  CTA Abdomen:  Soft, nontender Musculoskeletal:  No deformities Skin:  No rashes on visible skin  LABS:  BMET  Recent Labs Lab 02/28/16 2007 02/28/16 2015  NA 140 140  K 3.8 3.9  CL 107 106  CO2 26  --   BUN 13 15  CREATININE 1.55* 1.60*  GLUCOSE 104* 101*    Electrolytes  Recent Labs Lab 02/28/16 2007  CALCIUM 8.9    CBC  Recent Labs Lab 02/28/16 2007 02/28/16 2015  WBC 6.3  --   HGB 13.4 14.3  HCT 40.3 42.0  PLT 115*  --     Coag's  Recent Labs Lab 02/28/16 2007  APTT 33  INR 1.08    Sepsis Markers No results for input(s): LATICACIDVEN, PROCALCITON, O2SATVEN in the last 168 hours.  ABG  Recent Labs Lab 02/28/16 2148  PHART 7.103*  PCO2ART 58.0*  PO2ART 241.0*    Liver Enzymes  Recent Labs Lab 02/28/16 2007  AST 22  ALT 21  ALKPHOS 97  BILITOT 0.8  ALBUMIN 3.4*    Cardiac Enzymes No results for input(s): TROPONINI, PROBNP in the last 168 hours.  Glucose  Recent Labs Lab 02/28/16 2019  GLUCAP 113*    Imaging Ct Angio Head W Or Wo Contrast  Result Date: 02/28/2016 CLINICAL DATA:  Suspected stroke.  Seizure during the examination. EXAM: CT ANGIOGRAPHY HEAD AND NECK TECHNIQUE: Multidetector CT imaging of the head and neck was performed using the standard protocol during bolus administration of intravenous contrast. Multiplanar CT image reconstructions and MIPs were obtained to evaluate the vascular anatomy. Carotid stenosis measurements (when applicable) are obtained utilizing NASCET criteria, using the distal internal carotid diameter as the  denominator. CONTRAST:  50 mL Isovue 370 IV COMPARISON:  None. FINDINGS: CTA NECK FINDINGS Aortic arch: There is an aortic arch endograft stent. There is no aneurysm or dissection of the visualized ascending aorta or aortic arch. Proximal subclavian arteries are normal. Right carotid system: The right common carotid origin is widely patent. There is no common carotid or internal carotid artery dissection or aneurysm. No hemodynamically significant stenosis. Left carotid system: The left common carotid origin is widely patent. There is no common carotid or internal carotid artery dissection or aneurysm. No hemodynamically significant stenosis. Vertebral arteries: The vertebral system is left dominant. Both vertebral artery origins are normal. Both vertebral arteries are normal to their confluence with the basilar artery. Skeleton: There is no bony spinal canal stenosis. No lytic or blastic lesions. Other neck: The nasopharynx is clear. The oropharynx and hypopharynx are normal. The epiglottis is normal. The supraglottic larynx, glottis and subglottic larynx are normal. No retropharyngeal collection. The parapharyngeal spaces are preserved. The parotid and submandibular glands are normal. No sialolithiasis or salivary ductal dilatation. The thyroid gland is normal. There is no cervical lymphadenopathy. Upper chest: There is bilateral upper  lobe consolidation. Review of the MIP images confirms the above findings CTA HEAD FINDINGS Anterior circulation: --Intracranial internal carotid arteries: Normal. 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 Otherwise, the anterior cerebral arteries are normal. --Middle cerebral arteries: Moderate narrowing of the left M2 segment proximally. Mild irregularity of the Otherwise normal. --Posterior communicating arteries: Absent bilaterally. Posterior circulation: --Posterior cerebral arteries: There is moderate to severe narrowing of  the P2 segment of the right PCA. There is mild multifocal narrowing of the left PCA. --Superior cerebellar arteries: Normal. --Basilar artery: There is basilar dolichoectasia, with luminal diameter measuring up to 5.5 mm. --Anterior inferior cerebellar arteries: Not clearly visualized, which is not uncommon. --Posterior inferior cerebellar arteries: Normal. Venous sinuses: As permitted by contrast timing, patent. Anatomic variants: None Review of the MIP images confirms the above findings IMPRESSION: 1. No intracranial arterial occlusion. 2. Moderate narrowing of the left MCA M2 segments. 3. Moderate to severe narrowing of the right PCA P2 segment. 4. Basilar artery dolichoectasia. 5. Bilateral pulmonary upper lobe consolidation, likely aspiration or pneumonia. 6. Normal CTA of the neck. 7. Aortic atherosclerosis. Electronically Signed   By: Ulyses Jarred M.D.   On: 02/28/2016 22:19   Ct Angio Neck W Or Wo Contrast  Result Date: 02/28/2016 CLINICAL DATA:  Suspected stroke.  Seizure during the examination. EXAM: CT ANGIOGRAPHY HEAD AND NECK TECHNIQUE: Multidetector CT imaging of the head and neck was performed using the standard protocol during bolus administration of intravenous contrast. Multiplanar CT image reconstructions and MIPs were obtained to evaluate the vascular anatomy. Carotid stenosis measurements (when applicable) are obtained utilizing NASCET criteria, using the distal internal carotid diameter as the denominator. CONTRAST:  50 mL Isovue 370 IV COMPARISON:  None. FINDINGS: CTA NECK FINDINGS Aortic arch: There is an aortic arch endograft stent. There is no aneurysm or dissection of the visualized ascending aorta or aortic arch. Proximal subclavian arteries are normal. Right carotid system: The right common carotid origin is widely patent. There is no common carotid or internal carotid artery dissection or aneurysm. No hemodynamically significant stenosis. Left carotid system: The left common  carotid origin is widely patent. There is no common carotid or internal carotid artery dissection or aneurysm. No hemodynamically significant stenosis. Vertebral arteries: The vertebral system is left dominant. Both vertebral artery origins are normal. Both vertebral arteries are normal to their confluence with the basilar artery. Skeleton: There is no bony spinal canal stenosis. No lytic or blastic lesions. Other neck: The nasopharynx is clear. The oropharynx and hypopharynx are normal. The epiglottis is normal. The supraglottic larynx, glottis and subglottic larynx are normal. No retropharyngeal collection. The parapharyngeal spaces are preserved. The parotid and submandibular glands are normal. No sialolithiasis or salivary ductal dilatation. The thyroid gland is normal. There is no cervical lymphadenopathy. Upper chest: There is bilateral upper lobe consolidation. Review of the MIP images confirms the above findings CTA HEAD FINDINGS Anterior circulation: --Intracranial internal carotid arteries: Normal. 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 Otherwise, the anterior cerebral arteries are normal. --Middle cerebral arteries: Moderate narrowing of the left M2 segment proximally. Mild irregularity of the Otherwise normal. --Posterior communicating arteries: Absent bilaterally. Posterior circulation: --Posterior cerebral arteries: There is moderate to severe narrowing of the P2 segment of the right PCA. There is mild multifocal narrowing of the left PCA. --Superior cerebellar arteries: Normal. --Basilar artery: There is basilar dolichoectasia, with luminal diameter measuring up to 5.5 mm. --  Anterior inferior cerebellar arteries: Not clearly visualized, which is not uncommon. --Posterior inferior cerebellar arteries: Normal. Venous sinuses: As permitted by contrast timing, patent. Anatomic variants: None Review of the MIP images confirms the above findings  IMPRESSION: 1. No intracranial arterial occlusion. 2. Moderate narrowing of the left MCA M2 segments. 3. Moderate to severe narrowing of the right PCA P2 segment. 4. Basilar artery dolichoectasia. 5. Bilateral pulmonary upper lobe consolidation, likely aspiration or pneumonia. 6. Normal CTA of the neck. 7. Aortic atherosclerosis. Electronically Signed   By: Ulyses Jarred M.D.   On: 02/28/2016 22:19   Dg Chest Portable 1 View  Result Date: 02/28/2016 CLINICAL DATA:  Emergent intubation. EXAM: PORTABLE CHEST 1 VIEW COMPARISON:  CT of the chest 10/13/2015 FINDINGS: Endotracheal tube terminates 2.4 cm above the carina. Enteric catheters collimated off the image. Changes from median sternotomy and aortic valve placement are seen. Aortic stent graft is stable radiographically with increased density along the expected course of the distal aorta. The cardiac silhouette is enlarged. Low lung volumes. There is no evidence of focal airspace consolidation, pleural effusion or pneumothorax. Mild haziness of the lungs may represent pulmonary edema. Osseous structures are without acute abnormality. Soft tissues are grossly normal. IMPRESSION: Endotracheal tube terminates 2.4 cm above the carina. Enlargement of the cardiac silhouette. Stable appearance of the aortic stent graft with increased opacity along the lateral border of the descending aorta at the site of known aortic dissection and aneurysmal dilation. This may represent enlargement of the known aortic dissection, however this radiographic appearance may be produced by incomplete inflation of the lungs an AP technique as well. Electronically Signed   By: Fidela Salisbury M.D.   On: 02/28/2016 21:38   Ct Head Code Stroke W/o Cm  Result Date: 02/28/2016 CLINICAL DATA:  Code stroke. Altered mental status. Minimal unresponsiveness. EXAM: CT HEAD WITHOUT CONTRAST TECHNIQUE: Contiguous axial images were obtained from the base of the skull through the vertex without  intravenous contrast. COMPARISON:  Head CT 09/27/2014 FINDINGS: Brain: No mass lesion, intraparenchymal hemorrhage or extra-axial collection. No evidence of acute cortical infarct. There is periventricular hypoattenuation compatible with chronic microvascular disease. There are areas in both frontal lobes that appear to worsened slightly from the prior study. Vascular: No hyperdense vessel or unexpected calcification. Skull: Normal visualized skull base, calvarium and extracranial soft tissues. Sinuses/Orbits: No sinus fluid levels or advanced mucosal thickening. No mastoid effusion. Normal orbits. ASPECTS [facility] Stroke Program Early CT Score) - Ganglionic level infarction (caudate, lentiform nuclei, internal capsule, insula, M1-M3 cortex): 7 - Supraganglionic infarction (M4-M6 cortex): 3 Total score (0-10 with 10 being normal): 10 IMPRESSION: 1. No acute intracranial abnormality. Slight worsening of bilateral frontal predominant white matter disease, likely chronic microvascular ischemia. 2. ASPECTS is 10. These results were called by telephone at the time of interpretation on 02/28/2016 at 8:27 pm to Dr. Delora Fuel , who verbally acknowledged these results. Electronically Signed   By: Ulyses Jarred M.D.   On: 02/28/2016 20:27     STUDIES:  CT Head/neck >> no acute stroke seen EEG > (read pending, but prelim is no sz)  CULTURES: None  ANTIBIOTICS: None  SIGNIFICANT EVENTS: Intubated in ED, c/b bradycardic event  LINES/TUBES: ETT PIV  DISCUSSION: 45 y/o man presenting with severe hypertension, AMS, seizure.  ASSESSMENT / PLAN:  PULMONARY A: Need for mechanical ventilation P:   Full vent support SBT in AM  CARDIOVASCULAR A:  Hx of aortic dissection s/p repair Mixed Cardiomyopathy, systolic / diastolic  HF Hypertensive Emergency P:  Continue home meds Tx HTN with Cardene, will also be lowered w/ propofol/sedation Consider chest CT to ensure no change in grafts  RENAL A:    Hx of renal insuffiencey  P:   Will monitor  GASTROINTESTINAL A:   No active issues P:    HEMATOLOGIC A:   No active issues P:   INFECTIOUS A:   No active issues P:    ENDOCRINE A:   No active issues   P:    NEUROLOGIC A:   Hypertensive Emergency (Possible PRESS) Seizure Possible stroke P:   Appreciate neuro input, agree w/ MRI Will keep intubated tonight, will plan for SBT in AM w/ possible extubation  RASS goal: 0  FAMILY  - Updates:   - Inter-disciplinary family meet or Palliative Care meeting due by:  12/22   CRITICAL CARE Performed by: Luz Brazen   Total critical care time: 60 minutes  Critical care time was exclusive of separately billable procedures and treating other patients.  Critical care was necessary to treat or prevent imminent or life-threatening deterioration.  Critical care was time spent personally by me on the following activities: development of treatment plan with patient and/or surrogate as well as nursing, discussions with consultants, evaluation of patient's response to treatment, examination of patient, obtaining history from patient or surrogate, ordering and performing treatments and interventions, ordering and review of laboratory studies, ordering and review of radiographic studies, pulse oximetry and re-evaluation of patient's condition.  Luz Brazen, MD Pulmonary and [place] Pager: [phone]  02/28/2016, 11:30 PM

## 2016-03-12 NOTE — Transfer of Care (Signed)
Immediate Anesthesia Transfer of Care Note  Patient: [name]  Procedure(s) Performed: Procedure(s): TRANSESOPHAGEAL ECHOCARDIOGRAM (TEE) (N/A)  Patient Location: Endoscopy Unit  Anesthesia Type:MAC  Level of Consciousness: awake and patient cooperative  Airway & Oxygen Therapy: Patient Spontanous Breathing  Post-op Assessment: Report given to RN and Post -op Vital signs reviewed and stable  Post vital signs: Reviewed and stable  Last Vitals:  Vitals:   03/12/16 0651  BP: 124/70  Pulse: 63  Resp: 10  Temp: 36.6 C    Last Pain:  Vitals:   03/12/16 0651  TempSrc: Oral         Complications: No apparent anesthesia complications

## 2016-03-12 NOTE — Anesthesia Postprocedure Evaluation (Signed)
Anesthesia Post Note  Patient: [name]  Procedure(s) Performed: Procedure(s) (LRB): TRANSESOPHAGEAL ECHOCARDIOGRAM (TEE) (N/A)  Patient location during evaluation: PACU Anesthesia Type: MAC Level of consciousness: awake and alert Pain management: pain level controlled Vital Signs Assessment: post-procedure vital signs reviewed and stable Respiratory status: spontaneous breathing, nonlabored ventilation, respiratory function stable and patient connected to nasal cannula oxygen Cardiovascular status: stable and blood pressure returned to baseline Anesthetic complications: no       Last Vitals:  Vitals:   03/12/16 0840 03/12/16 0850  BP: 130/71 138/76  Pulse: (!) 51 (!) 50  Resp: 17 17  Temp:      Last Pain:  Vitals:   03/12/16 0830  TempSrc: Oral                 [name] DAVID

## 2016-03-12 NOTE — Interval H&P Note (Signed)
History and Physical Interval Note:  03/12/2016 8:35 AM  [name]  has presented today for surgery, with the diagnosis of STROKE  The various methods of treatment have been discussed with the patient and family. After consideration of risks, benefits and other options for treatment, the patient has consented to  Procedure(s): TRANSESOPHAGEAL ECHOCARDIOGRAM (TEE) (N/A) as a surgical intervention .  The patient's history has been reviewed, patient examined, no change in status, stable for surgery.  I have reviewed the patient's chart and labs.  Questions were answered to the patient's satisfaction.     Litzy Dicker

## 2016-03-12 NOTE — Op Note (Signed)
INDICATIONS: embolic stroke  PROCEDURE:   Informed consent was obtained prior to the procedure. The risks, benefits and alternatives for the procedure were discussed and the patient comprehended these risks.  Risks include, but are not limited to, cough, sore throat, vomiting, nausea, somnolence, esophageal and stomach trauma or perforation, bleeding, low blood pressure, aspiration, pneumonia, infection, trauma to the teeth and death.    After a procedural time-out, the oropharynx was anesthetized with 20% benzocaine spray.   During this procedure the patient was administered IV propofol for sedation by Anesthesiology (Dr. Sharol Roussel).  The transesophageal probe was inserted in the esophagus and stomach without difficulty and multiple views were obtained.  The patient was kept under observation until the patient left the procedure room.  The patient left the procedure room in stable condition.   Agitated microbubble saline contrast was administered.  COMPLICATIONS:    There were no immediate complications.  FINDINGS:  No clear source of embolism. Markedly hypertrophic left ventricle with normal systolic function and wall motionSmall left atrium without thrombus in the appendage. 1-2+ MR. Normal bioprosthetic AVR. No AR. S/P aortic graft repair of the ascending aorta. The proximal suture line is intact without leak, but appears a little irregular. The distal ascending aorta and arch cannot be well visualized due to graft related acoustic shadowing. The proximal descending aorta appears relatively normal, with a very small thrombosed false lumen. The distal thoracic aorta has a large thrombosed false lumen.  RECOMMENDATIONS:     Monitor for atrial arrhythmia.  Time Spent Directly with the Patient:  30 minutes   [name] 03/12/2016, 8:27 AM

## 2016-03-13 ENCOUNTER — Other Ambulatory Visit: Payer: Self-pay

## 2016-03-13 ENCOUNTER — Ambulatory Visit: Payer: Medicare Other | Admitting: Physician Assistant

## 2016-03-13 MED ORDER — HYDRALAZINE HCL 50 MG PO TABS: 50.0000 mg | ORAL_TABLET | Freq: Two times a day (BID) | ORAL | 0 refills | Status: DC

## 2016-03-13 NOTE — Progress Notes (Deleted)
Cardiology Office Note Date:  03/13/2016  Patient ID:  [name], [name] 08/12/1970, MRN [id number] PCP:  [name], MD  Cardiologist:  Dr. Aundra Dubin  ***refresh   Chief Complaint: f/u palpitations/stroke  History of Present Illness: [name] is a 45 y.o. male with history of chronic type B aortic dissection, HTN, NICM likely related to HTN heart disease with past hospitalizations with hypertensive emergencies, arotic dissection suspect secondary to HTN, tobacco and ETOH abuse, + THC, CKD, valvular heart disease with bioprosthetic AVR, admitted to [facility] 02/28/16 with stroke, hypertensive emergency tx wth cardene gtt, seizure, required intubation early in his stay.  Imaging demonstrated Moderate left MCA branch infarct, embolic secondary to unknown source. Concerning for paroxysmal afib.  EP was asked to see the patient for consideration of loop implant though given his report of significant palpitations in discussion with Dr. Caryl Comes felt the best 1st step would be event monitoring.  TEE was unable to be done while in the hospital, though completed yesterday and noted below.  The patient comes in today feeling ***   *** palpitations ?ordered for EM already? *** symptoms  *** smoking *** HTN  Past Medical History:  Diagnosis Date  . Adenomatous colon polyp 08/2015  . Anxiety   . Aortic disease ([place])   . Aortic dissection ([facility])    a. admx 04/2014 >> L renal infarct; a/c renal failure >> b.  s/p Bioprosthetic [place] and total arch replacement and staged endovascular repair of descending aortic aneurysm ([facility] - Dr. [name])  . CAD (coronary artery disease)    a. LHC 4/16:  oD1 60%  . Cardiomyopathy ([place])    a. non-ischemic - probably related to untreated HTN and ETOH abuse - Echo 3/13 with EF 35-40% >> b. Echo 4/16: Severe LVH, EF 55-60%, moderate AI, moderate MR, mild LAE, trivial effusion, known type B dissection with communication between true and false lumens with suprasternal  images suggesting dissection plane may propagate to at least left subclavian takeoff, root above aortic valve okay    . Chronic abdominal pain   . Chronic combined systolic and diastolic congestive heart failure ([place])    a. 05/2011: Adm with pulm edema/HTN urgency, EF 35-40% with diffuse hypokinesis and moderate to severe mitral regurgitation. Cardiomyopathy likely due to uncontrolled HTN and ETOH abuse - cath deferred due to renal insufficiency (felt due to uncontrolled HTN). bJodie Ewing MV 06/2011: EF 37% and no ischemia or infarction. c. EF 45-50% by echo 01/2012.  [name] Chronic sinusitis   . CKD (chronic kidney disease)    a. 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  peak creatinine 3.46 during admx for aortic dissection 2/16.  sees Dr [name]  . Colon polyps 09/11/2015   Descending, sigmoid polyps  . DDD (degenerative disc disease), lumbar   . Descending thoracic aortic aneurysm ([place])   . Dissecting aneurysm of thoracic aorta ([place])   . ETOH abuse    a. Reported to have quit 05/2011.  [name] GERD (gastroesophageal reflux disease)   . Headache(784.0)    "q other day" (08/08/2013)  . Heart murmur   . Hemorrhoid thrombosis   . History of echocardiogram    Echo 1/17:  Severe LVH, EF 55-60%, no RWMA, Gr 2 DD, AVR ok, mild to mod MR, mild LAE, mild reduced RVSF, mod RAE  . History of medication noncompliance   . HYPERLIPIDEMIA   . Hypertension    a. Hx of HTN urgency secondary to noncompliance. b. urinary metanephrine and catecholeamine levels normal  2013.  c. Renal art [name] 1/16:  No evidence of renal artery stenosis noted bilaterally.  . INGUINAL HERNIA   . Pneumonia ~ 2013  . Renal insufficiency   . Tobacco abuse   . Valvular heart disease    a. Echo 05/2011: moderate to severe eccentric MR and mild to moderate AI with prolapsing left coronary cusp. b. Echo 01/2012: mild-mod AI, mild dilitation of aortic root, mild MR.;  c. Echo 1/16: Severe LVH consistent with hypertrophic cardio myopathy, EF 50%, no RWMA, mod  AI, mild MR, mild RAE, dilated Ao root (40 mm);     Past Surgical History:  Procedure Laterality Date  . ANKLE SURGERY Bilateral    Fractures bilaterally  . AORTIC VALVE SURGERY  09/2014  . FOOT FRACTURE SURGERY Bilateral 2004-2010   "got pins in both of them"  . HEMORRHOID SURGERY N/A 06/15/2015   Procedure: HEMORRHOIDECTOMY;  Surgeon: Stark Klein, MD;  Location: [place];  Service: General;  Laterality: N/A;  . INGUINAL HERNIA REPAIR Right ~ 1996  . LEFT HEART CATHETERIZATION WITH CORONARY ANGIOGRAM N/A 06/21/2014   Procedure: LEFT HEART CATHETERIZATION WITH CORONARY ANGIOGRAM;  Surgeon: Larey Dresser, MD;  Location[facility] CATH LAB;  Service: Cardiovascular;  Laterality: N/A;  . TEE WITHOUT CARDIOVERSION N/A 03/12/2016   Procedure: TRANSESOPHAGEAL ECHOCARDIOGRAM (TEE);  Surgeon: Sanda Klein, MD;  Location: [facility] ENDOSCOPY;  Service: Cardiovascular;  Laterality: N/A;    Current Outpatient Prescriptions  Medication Sig Dispense Refill  . acetaminophen (TYLENOL) 500 MG tablet Take 1,000 mg by mouth 3 (three) times daily.    [name] acetaminophen-codeine (TYLENOL #3) 300-30 MG tablet Take 1 tablet by mouth every 8 (eight) hours as needed for moderate pain. (Patient not taking: Reported on 03/12/2016) 15 tablet 0  . amLODipine (NORVASC) 10 MG tablet Take 10 mg by mouth daily.    [name] atorvastatin (LIPITOR) 20 MG tablet Take 20 mg by mouth daily.    . cloNIDine (CATAPRES) 0.2 MG tablet Take 1 tablet (0.2 mg total) by mouth 3 (three) times daily. (Patient taking differently: Take 0.2 mg by mouth 2 (two) times daily. )    . clopidogrel (PLAVIX) 75 MG tablet Take 1 tablet (75 mg total) by mouth daily. 30 tablet 0  . DULoxetine (CYMBALTA) 30 MG capsule Take 30 mg by mouth daily.    . hydrALAZINE (APRESOLINE) 50 MG tablet Take 50 mg by mouth 2 (two) times daily.    [name] labetalol (NORMODYNE) 300 MG tablet Take 2 tablets (600 mg total) by mouth 2 (two) times daily. (Patient taking differently: Take 300 mg by mouth 2  (two) times daily. ) 240 tablet 6  . levETIRAcetam (KEPPRA) 500 MG tablet Take 1 tablet (500 mg total) by mouth 2 (two) times daily. 60 tablet 0  . lidocaine (LIDODERM) 5 % Place 1 patch onto the skin daily. Remove & Discard patch within 12 hours or as directed by MD    . meclizine (ANTIVERT) 25 MG tablet Take 25 mg by mouth 3 (three) times daily as needed for nausea.    [name] omeprazole (PRILOSEC) 20 MG capsule Take 1 capsule (20 mg total) by mouth daily. 30 capsule 0   No current facility-administered medications for this visit.     Allergies:   Imdur [isosorbide nitrate] and Tramadol   Social History:  The patient  reports that he has been smoking Cigarettes.  He has a 5.75 pack-year smoking history. He has quit using smokeless tobacco. His smokeless tobacco use included Chew. He  reports that he uses drugs, including Marijuana. He reports that he does not drink alcohol.   Family History:  The patient's family history includes Colon cancer in his paternal uncle; Diabetes in his maternal aunt; Heart attack in his brother; Stroke in his maternal aunt.  ROS:  Please see the history of present illness.  All other systems are reviewed and otherwise negative.   PHYSICAL EXAM: *** VS:  There were no vitals taken for this visit. BMI: There is no height or weight on file to calculate BMI. Well nourished, well developed, in no acute distress  HEENT: normocephalic, atraumatic  Neck: no JVD, carotid bruits or masses Cardiac:  ***  RRR; no significant murmurs, no rubs, or gallops Lungs:  *** clear to auscultation bilaterally, no wheezing, rhonchi or rales  Abd: soft, nontender MS: no deformity or atrophy Ext: no *** edema  Skin: warm and dry, no rash Neuro:  No gross deficits appreciated Psych: euthymic mood, full affect   EKG:  All epic EKGs were reviewed and all SR  03/12/16: TEE Study Conclusions - Left ventricle: The cavity size was normal. Wall thickness was   increased in a pattern of  severe LVH. There was concentric   hypertrophy. Systolic function was normal. The estimated ejection   fraction was in the range of 55% to 60%. Wall motion was normal;   there were no regional wall motion abnormalities. - Aortic valve: A bioprosthesis was present and functioning   normally. No evidence of vegetation. - Aorta: S/P ascending aorta graft repair. The sinuses are   preserved and appear to be at the upper limit of normal in size.   The proximal graft suture line is very prominen, but appears   intact. The aortic arch (graft replacement) is not well seen due   to graft shadowing. There is minimal residual false lumen in the   proximal descending thoracic aorta. In the distal descending   thoracic aorta there is a sizeable false lumen (up to 2.4 cm   thickness), but it appears to be fully thrombosed. - Mitral valve: No evidence of vegetation. There was mild   regurgitation directed centrally. - Left atrium: The atrium was mildly to moderately dilated. No   evidence of thrombus in the atrial cavity or appendage. No   spontaneous echo contrast was observed. - Right atrium: No evidence of thrombus in the atrial cavity or   appendage. No evidence of thrombus in the atrial cavity or   appendage. - Atrial septum: No defect or patent foramen ovale was identified. - Tricuspid valve: No evidence of vegetation. - Pulmonic valve: No evidence of vegetation.  03/01/16: TTE Study Conclusions - Left ventricle: The cavity size was normal. Wall thickness was increased in a pattern of severe LVH. Systolic function was normal. The estimated ejection fraction was in the range of 55% to 60%. Wall motion was normal; there were no regional wall motion abnormalities. Features are consistent with a pseudonormal left ventricular filling pattern, with concomitant abnormal relaxation and increased filling pressure (grade 2 diastolic dysfunction). - Aortic valve: A bioprosthesis was  present and functioning normally. Valve area (VTI): 1.74 cm^2. Valve area (Vmax): 1.56 cm^2. Valve area (Vmean): 1.69 cm^2. - Aorta: The proximal aorta is dilated. In one view what appears to be a disseection flap is seen more distally . Suggest TEE if further eval needed. - Mitral valve: There was mild to moderate regurgitation. - Left atrium: The atrium was moderately dilated.  CT head/neck notes; There  is an aortic arch endograft stent. There is no aneurysm   Recent Labs: 03/03/2016: Magnesium 2.0 03/04/2016: ALT 23 03/06/2016: BUN 13; Creatinine, Ser 1.87; Hemoglobin 11.8; Platelets 118; Potassium 4.1; Sodium 137  03/04/2016: Cholesterol 178; HDL 30; LDL Cholesterol 124; Total CHOL/HDL Ratio 5.9; Triglycerides 119; VLDL 24   Estimated Creatinine Clearance: 50.6 mL/min (by C-G formula based on SCr of 1.87 mg/dL (H)).   Wt Readings from Last 3 Encounters:  03/12/16 155 lb (70.3 kg)  03/06/16 155 lb 3.2 oz (70.4 kg)  11/10/15 155 lb (70.3 kg)     Other studies reviewed: Additional studies/records reviewed today include:   ASSESSMENT AND PLAN:  1. Palpitations     Recent CVA, no documented hx of AF     ***   2. HTN     ***      HTN emergency on presentation with his stroke, required cardene gtt  3. chronic type B aortic dissection, graft with AVR (biopsrosthetic)     Records indiate likely secondary to HTN      4. smoking  Disposition: F/u with ***  Current medicines are reviewed at length with the patient today.  The patient did not have any concerns regarding medicines.***  Haywood Lasso, PA-C 03/13/2016 6:03 AM     [facility] [address] [place] [address] [phone] (office)  [phone] (fax)

## 2016-03-17 ENCOUNTER — Ambulatory Visit (INDEPENDENT_AMBULATORY_CARE_PROVIDER_SITE_OTHER): Payer: Medicare Other

## 2016-03-17 ENCOUNTER — Other Ambulatory Visit: Payer: Self-pay | Admitting: Physician Assistant

## 2016-03-17 ENCOUNTER — Other Ambulatory Visit: Payer: Self-pay

## 2016-03-17 DIAGNOSIS — I639 Cerebral infarction, unspecified: Secondary | ICD-10-CM | POA: Diagnosis not present

## 2016-03-17 DIAGNOSIS — R002 Palpitations: Secondary | ICD-10-CM

## 2016-03-17 DIAGNOSIS — I4891 Unspecified atrial fibrillation: Secondary | ICD-10-CM | POA: Diagnosis not present

## 2016-03-17 MED ORDER — ATORVASTATIN CALCIUM 20 MG PO TABS: 20.0000 mg | ORAL_TABLET | Freq: Every day | ORAL | 0 refills | Status: DC

## 2016-03-17 MED ORDER — OMEPRAZOLE 20 MG PO CPDR: 20.0000 mg | DELAYED_RELEASE_CAPSULE | Freq: Every day | ORAL | 0 refills | Status: DC

## 2016-03-17 MED ORDER — HYDRALAZINE HCL 50 MG PO TABS: 50.0000 mg | ORAL_TABLET | Freq: Two times a day (BID) | ORAL | 0 refills | Status: DC

## 2016-03-18 ENCOUNTER — Other Ambulatory Visit: Payer: Self-pay | Admitting: *Deleted

## 2016-03-18 ENCOUNTER — Ambulatory Visit: Payer: Medicare Other | Admitting: Occupational Therapy

## 2016-03-18 ENCOUNTER — Ambulatory Visit: Payer: Medicare Other | Attending: Internal Medicine | Admitting: Physical Therapy

## 2016-03-18 ENCOUNTER — Encounter: Payer: Self-pay | Admitting: Physical Therapy

## 2016-03-18 ENCOUNTER — Encounter: Payer: Self-pay | Admitting: Occupational Therapy

## 2016-03-18 ENCOUNTER — Telehealth: Payer: Self-pay | Admitting: *Deleted

## 2016-03-18 DIAGNOSIS — R482 Apraxia: Secondary | ICD-10-CM

## 2016-03-18 DIAGNOSIS — R29818 Other symptoms and signs involving the nervous system: Secondary | ICD-10-CM

## 2016-03-18 DIAGNOSIS — R41842 Visuospatial deficit: Secondary | ICD-10-CM | POA: Insufficient documentation

## 2016-03-18 DIAGNOSIS — R4701 Aphasia: Secondary | ICD-10-CM | POA: Insufficient documentation

## 2016-03-18 DIAGNOSIS — R278 Other lack of coordination: Secondary | ICD-10-CM | POA: Diagnosis not present

## 2016-03-18 MED ORDER — ATORVASTATIN CALCIUM 20 MG PO TABS: 20.0000 mg | ORAL_TABLET | Freq: Every day | ORAL | 0 refills | Status: DC

## 2016-03-18 MED ORDER — OMEPRAZOLE 20 MG PO CPDR: 20.0000 mg | DELAYED_RELEASE_CAPSULE | Freq: Every day | ORAL | 0 refills | Status: DC

## 2016-03-18 MED ORDER — HYDRALAZINE HCL 50 MG PO TABS: 50.0000 mg | ORAL_TABLET | Freq: Two times a day (BID) | ORAL | 0 refills | Status: DC

## 2016-03-18 NOTE — Therapy (Signed)
[facility] [address] [place], [place], [address] Phone: [phone]   Fax:  [phone]  Physical Therapy Evaluation  Patient Details  Name: [name] MRN: [id number] Date of Birth: March 04, 1971 Referring Provider: Rosalin Hawking, neurologist [name], MD)  Encounter Date: 03/18/2016      PT End of Session - 03/18/16 2322    Visit Number 1   Number of Visits 1   Authorization Type Medicare   PT Start Time 1400   PT Stop Time 1440   PT Time Calculation (min) 40 min   Activity Tolerance Patient tolerated treatment well   Behavior During Therapy [facility] for tasks assessed/performed      Past Medical History:  Diagnosis Date  . Adenomatous colon polyp 08/2015  . Anxiety   . Aortic disease ([place])   . Aortic dissection (HCC)    a. admx 04/2014 >> L renal infarct; a/c renal failure >> b.  s/p Bioprosthetic [place] and total arch replacement and staged endovascular repair of descending aortic aneurysm ([facility] - Dr. [name])  . CAD (coronary artery disease)    a. LHC 4/16:  oD1 60%  . Cardiomyopathy ([place])    a. non-ischemic - probably related to untreated HTN and ETOH abuse - Echo 3/13 with EF 35-40% >> b. Echo 4/16: Severe LVH, EF 55-60%, moderate AI, moderate MR, mild LAE, trivial effusion, known type B dissection with communication between true and false lumens with suprasternal images suggesting dissection plane may propagate to at least left subclavian takeoff, root above aortic valve okay    . Chronic abdominal pain   . Chronic combined systolic and diastolic congestive heart failure ([place])    a. 05/2011: Adm with pulm edema/HTN urgency, EF 35-40% with diffuse hypokinesis and moderate to severe mitral regurgitation. Cardiomyopathy likely due to uncontrolled HTN and ETOH abuse - cath deferred due to renal insufficiency (felt due to uncontrolled HTN). bJodie Echevaria MV 06/2011: EF 37% and no ischemia or infarction. c. EF 45-50% by  echo 01/2012.  [name] Chronic sinusitis   . CKD (chronic kidney disease)    a. 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  peak creatinine 3.46 during admx for aortic dissection 2/16.  sees Dr [name]  . Colon polyps 09/11/2015   Descending, sigmoid polyps  . DDD (degenerative disc disease), lumbar   . Descending thoracic aortic aneurysm ([place])   . Dissecting aneurysm of thoracic aorta ([place])   . ETOH abuse    a. Reported to have quit 05/2011.  [name] GERD (gastroesophageal reflux disease)   . Headache(784.0)    "q other day" (08/08/2013)  . Heart murmur   . Hemorrhoid thrombosis   . History of echocardiogram    Echo 1/17:  Severe LVH, EF 55-60%, no RWMA, Gr 2 DD, AVR ok, mild to mod MR, mild LAE, mild reduced RVSF, mod RAE  . History of medication noncompliance   . HYPERLIPIDEMIA   . Hypertension    a. Hx of HTN urgency secondary to noncompliance. b. urinary metanephrine and catecholeamine levels normal 2013.  c. Renal art [name] 1/16:  No evidence of renal artery stenosis noted bilaterally.  . INGUINAL HERNIA   . Pneumonia ~ 2013  . Renal insufficiency   . Tobacco abuse   . Valvular heart disease    a. Echo 05/2011: moderate to severe eccentric MR and mild to moderate AI with prolapsing left coronary cusp. b. Echo 01/2012: mild-mod AI, mild dilitation of aortic root, mild MR.;  c. Echo 1/16:  Severe LVH consistent with hypertrophic cardio myopathy, EF 50%, no RWMA, mod AI, mild MR, mild RAE, dilated Ao root (40 mm);     Past Surgical History:  Procedure Laterality Date  . ANKLE SURGERY Bilateral    Fractures bilaterally  . AORTIC VALVE SURGERY  09/2014  . FOOT FRACTURE SURGERY Bilateral 2004-2010   "got pins in both of them"  . HEMORRHOID SURGERY N/A 06/15/2015   Procedure: HEMORRHOIDECTOMY;  Surgeon: Stark Klein, MD;  Location: [facility];  Service: General;  Laterality: N/A;  . INGUINAL HERNIA REPAIR Right ~ 1996  . LEFT HEART CATHETERIZATION WITH CORONARY ANGIOGRAM N/A 06/21/2014   Procedure: LEFT HEART  CATHETERIZATION WITH CORONARY ANGIOGRAM;  Surgeon: Larey Dresser, MD;  Location: [facility] CATH LAB;  Service: Cardiovascular;  Laterality: N/A;  . TEE WITHOUT CARDIOVERSION N/A 03/12/2016   Procedure: TRANSESOPHAGEAL ECHOCARDIOGRAM (TEE);  Surgeon: Sanda Klein, MD;  Location: [facility] ENDOSCOPY;  Service: Cardiovascular;  Laterality: N/A;    There were no vitals filed for this visit.       Subjective Assessment - 03/18/16 1408    Subjective On 02/28/2016 he had onset of right side weakness and was diagnosed with CVA with MCA. He hospitalized 02/28/2016- 03/06/2016.    Patient is accompained by: Family member   Pertinent History Adenomatous colon polyp (08/2015); Anxiety; Aortic disease ([place]); Aortic dissection (HCC); CAD (coronary artery disease); Cardiomyopathy ([place]); Chronic abdominal pain; Chronic combined systolic and diastolic congestive heart failure ([place]); Chronic sinusitis; CKD (chronic kidney disease); Colon polyps (09/11/2015); DDD (degenerative disc disease), lumbar; Descending thoracic aortic aneurysm ([place]); Dissecting aneurysm of thoracic aorta ([place]); ETOH abuse; GERD (gastroesophageal reflux disease); Headache(784.0); Heart murmur; Hemorrhoid thrombosis; History of echocardiogram; History of medication noncompliance; HYPERLIPIDEMIA; Hypertension; INGUINAL HERNIA; Pneumonia (~ 2013); Renal insufficiency; Tobacco abuse; and Valvular heart disease.   Limitations Standing;Walking   Patient Stated Goals To be able to return to work, being involved with grandchildren.    Currently in Pain? Yes   Pain Score 0-No pain  In last week, worst 8/10, best 0/10   Pain Location Back   Pain Orientation Mid;Lower   Pain Descriptors / Indicators Aching   Pain Type Chronic pain   Pain Radiating Towards right leg to lower leg   Pain Onset More than a month ago   Pain Frequency Intermittent   Aggravating Factors  unknown, started with aorta surgery 04/2014   Pain Relieving Factors tylenol   Multiple Pain  Sites Yes   Pain Score 6  in last week, worst 8/10, best 0/10   Pain Location Hand   Pain Orientation Right   Pain Descriptors / Indicators Tingling   Pain Type Acute pain   Pain Onset 1 to 4 weeks ago   Pain Frequency Intermittent   Aggravating Factors  picking up cup,    Pain Relieving Factors hanging hand             [facility] PT Assessment - 03/18/16 1400      Assessment   Medical Diagnosis CVA   Referring Provider Rosalin Hawking, neurologist  [name], MD   Onset Date/Surgical Date 02/28/16   Hand Dominance Right     Precautions   Precautions Fall     Balance Screen   Has the patient fallen in the past 6 months No   Has the patient had a decrease in activity level because of a fear of falling?  Yes   Is the patient reluctant to leave their home because of a fear of falling?  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#, climbs ladders,      Posture/Postural Control   Posture/Postural Control No significant limitations     ROM / Strength   AROM / PROM / Strength AROM;Strength     AROM   Overall AROM  Within functional limits for tasks performed     Strength   Overall Strength Within functional limits for tasks performed   Overall Strength Comments tested in sitting & standing LEs 5/5     Transfers   Transfers Sit to Stand;Stand to Sit   Sit to Stand 7: Independent;Without  upper extremity assist;From chair/3-in-1   Stand to Sit 7: Independent;Without upper extremity assist;To chair/3-in-1     Ambulation/Gait   Ambulation/Gait Yes   Ambulation/Gait Assistance 7: Independent   Ambulation Distance (Feet) 500 Feet   Assistive device None   Gait Pattern Within Functional Limits   Ambulation Surface Indoor;Level   Gait velocity 3.64 ft/sec comfortable & 4.72 ft/sec fast   Stairs Yes   Stairs Assistance 7: Independent   Stair Management Technique No rails;Alternating pattern;Forwards   Number of Stairs 4   Ramp 7: Independent   Curb 7: Independent     Standardized Balance Assessment   Standardized Balance Assessment Berg Balance Test     Berg Balance Test   Sit to Stand Able to stand without using hands and stabilize independently   Standing Unsupported Able to stand safely 2 minutes   Sitting with Back Unsupported but Feet Supported on Floor or Stool Able to sit safely and securely 2 minutes   Stand to Sit Sits safely with minimal use of hands   Transfers Able to transfer safely, minor use of hands   Standing Unsupported with Eyes Closed Able to stand 10 seconds safely   Standing Ubsupported with Feet Together Able to place feet together independently and stand 1 minute safely   From Standing, Reach Forward with Outstretched Arm Can reach confidently >25 cm (10")   From Standing Position, Pick up Object from Floor Able to pick up shoe safely and easily   From Standing Position, Turn to Look Behind Over each Shoulder Looks behind from both sides and weight shifts well   Turn 360 Degrees Able to turn 360 degrees safely in 4 seconds or less   Standing Unsupported, Alternately Place Feet on Step/Stool Able to stand independently and safely and complete 8 steps in 20 seconds   Standing Unsupported, One Foot in Front Able to place foot tandem independently and hold 30 seconds   Standing on One Leg Able to lift leg independently and hold > 10 seconds   Total  Score 56     Functional Gait  Assessment   Gait assessed  Yes   Gait Level Surface Walks 20 ft in less than 5.5 sec, no assistive devices, good speed, no evidence for imbalance, normal gait pattern, deviates no more than 6  in outside of the 12 in walkway width.   Change in Gait Speed Able to smoothly change walking speed without loss of balance or gait deviation. Deviate no more than 6 in outside of the 12 in walkway width.   Gait with Horizontal Head Turns Performs head turns smoothly with no change in gait. Deviates no more than 6 in outside 12 in walkway width   Gait with Vertical Head Turns Performs head turns with no change in gait. Deviates no more than 6 in outside 12 in walkway width.   Gait and Pivot Turn Pivot turns safely within 3 sec and stops quickly with no loss of balance.   Step Over Obstacle Is able to step over 2 stacked shoe boxes taped together (9 in total height) without changing gait speed. No evidence of imbalance.   Gait with Narrow Base of Support Is able to ambulate for 10 steps heel to toe with no staggering.   Gait with Eyes Closed Walks 20 ft, no assistive devices, good speed, no evidence of imbalance, normal gait pattern, deviates no more than 6 in outside 12 in walkway width. Ambulates 20 ft in less than 7 sec.   Ambulating Backwards Walks 20 ft, no assistive devices, good speed, no evidence for imbalance, normal gait   Steps Alternating feet, no rail.   Total Score 30                           PT Education - 04/07/16 1440    Education provided Yes   Education Details CVA risk factors & smoking cessation   Person(s) Educated Patient;Spouse   Methods Explanation   Comprehension Verbalized understanding                    Plan - 2016-04-07 June 26, 2320    Clinical Impression Statement Patient does not appear to have PT needs at this time. He scored 56/56 on Berg Balance Test & 30/30 on Functional Gait Assessment. His LE strength appears  5/5. He was able to lift & carry 30# box safely. See OT note for UE issues.    PT Frequency One time visit   PT Next Visit Plan PT evaluation only   Consulted and Agree with Plan of Care Patient      Patient will benefit from skilled therapeutic intervention in order to improve the following deficits and impairments:     Visit Diagnosis: Other symptoms and signs involving the nervous system      G-Codes - Apr 07, 2016 Jun 26, 2324    Functional Assessment Tool Used Berg Balance 56/56 & Functional Gait Assessment 30/30   Functional Limitation Mobility: Walking and moving around   Mobility: Walking and Moving Around Current Status [phone]) 0 percent impaired, limited or restricted   Mobility: Walking and Moving Around Goal Status [phone]) 0 percent impaired, limited or restricted   Mobility: Walking and Moving Around Discharge Status [phone]) 0 percent impaired, limited or restricted       Problem List Patient Active Problem List   Diagnosis Date Noted  . Palpitations   . Cerebrovascular accident (CVA) due to embolism of left middle cerebral artery ([place])   . Malnutrition of moderate degree 02/29/2016  . Seizure ([place]) 02/28/2016  . HLD (hyperlipidemia) 08/28/2015  . Thrombosed hemorrhoids 06/14/2015  . Rectal bleeding 06/14/2015  . Rectal pain 06/14/2015  . 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[place]) 06-27-2015  . Cardiomyopathy ([place]) 03/01/2015  . Acid reflux 03/01/2015  . Essential  hypertension 03/01/2015  . Cardiac chest pain 12/10/2014  . Abdominal pain 10/12/2014  . Injury of kidney 10/03/2014  . S/P aortic dissection repair 10/03/2014  . H/O aortic valve replacement 10/03/2014  . Pre-op testing 06/15/2014  . CKD (chronic kidney disease) 05/15/2014  . Hypoxia   . AKI (acute kidney injury) ([place])   . Aortic dissection, thoracoabdominal ([place])   . Dissecting aneurysm of thoracic aorta, [place] type B ([place]) 04/24/2014  . Aortic dissection, thoracic ([place]) 04/24/2014  . Aortic  dissection ([place]) 04/23/2014  . Aneurysm of ascending aorta (HCC) 03/28/2014  . Dyspnea 03/27/2014  . Prolonged Q-T interval on ECG 03/27/2014  . ETOH abuse 09/20/2013  . Hypertensive crisis 08/08/2013  . Sinusitis, bacterial 08/08/2013  . Malignant hypertension 02/20/2013  . Hypertensive urgency 01/27/2012  . Cardiomyopathy, hypertensive ([place]) 01/26/2012  . AI (aortic insufficiency) 01/26/2012  . MR (mitral regurgitation) 01/26/2012  . Acute renal failure superimposed on stage 3 chronic kidney disease ([place]) 01/26/2012  . Systolic CHF, chronic ([place]) 06/10/2011  . Chronic bronchitis 05/23/2011  . Hypertensive emergency 05/22/2011  . Cardiomegaly - hypertensive 05/22/2011  . Tobacco abuse 05/22/2011  . Marijuana use 05/22/2011  . Abdominal pain 05/22/2011  . Hypertension, malignant 05/22/2011  . Anxiety and depression 05/22/2011  . Hyperlipidemia 08/26/2009  . Tobacco use 08/26/2009  . Headache(784.0) 08/26/2009  . Aortic valve disorder 03/11/2009  . INGUINAL HERNIA 02/18/2009  . Uncontrolled hypertension 01/16/2009  . MURMUR 01/16/2009    Sharlize Hoar PT, DPT 03/18/2016, 11:28 PM  [facility] [address] [place], [place], [address] Phone: [phone]   Fax:  [phone]  Name: LUCERO IDE MRN: [id number] Date of Birth: February 17, 1971

## 2016-03-18 NOTE — Telephone Encounter (Signed)
An unidentified caller left a msg on the refill vm stating that the pharmacy did not receive the refill. She did not leave the name of the medication nor the pharmacy she only requested a call back at [phone]. Thanks, MI

## 2016-03-18 NOTE — Telephone Encounter (Signed)
Called patient to verify which prescriptions need to be sent in, which pharmacy he uses, and which cardiologist patient is associated with.  Patient stated his prescriptions were Atorvastatin, Omeprazole, and Hydralazine that were not sent to the correct pharmacy. He no longer uses [facility]. He now uses [facility] on [address]. He says he sees both [place] and Aundra Dubin and will be calling to make a follow up appointment with [place] in the morning.

## 2016-03-18 NOTE — Therapy (Signed)
[facility] [address] [place], [place], [address] Phone: [phone]   Fax:  [phone]  Occupational Therapy Evaluation  Patient Details  Name: [name] MRN: [id number] Date of Birth: May 16, 1970 Referring Provider: Dr. Lottie Rater  Encounter Date: 03/18/2016      OT End of Session - 03/18/16 1637    Visit Number 1   Number of Visits 16   Date for OT Re-Evaluation 05/13/16   Authorization Type medicare will need G code and PN every 10th visit   Authorization Time Period 60 days   Authorization - Visit Number 1   Authorization - Number of Visits 10   OT Start Time 1448   OT Stop Time 1530   OT Time Calculation (min) 42 min   Activity Tolerance Patient tolerated treatment well      Past Medical History:  Diagnosis Date  . Adenomatous colon polyp 08/2015  . Anxiety   . Aortic disease ([place])   . Aortic dissection (HCC)    a. admx 04/2014 >> L renal infarct; a/c renal failure >> b.  s/p Bioprosthetic [place] and total arch replacement and staged endovascular repair of descending aortic aneurysm ([facility] - Dr. [name])  . CAD (coronary artery disease)    a. LHC 4/16:  oD1 60%  . Cardiomyopathy ([place])    a. non-ischemic - probably related to untreated HTN and ETOH abuse - Echo 3/13 with EF 35-40% >> b. Echo 4/16: Severe LVH, EF 55-60%, moderate AI, moderate MR, mild LAE, trivial effusion, known type B dissection with communication between true and false lumens with suprasternal images suggesting dissection plane may propagate to at least left subclavian takeoff, root above aortic valve okay    . Chronic abdominal pain   . Chronic combined systolic and diastolic congestive heart failure ([place])    a. 05/2011: Adm with pulm edema/HTN urgency, EF 35-40% with diffuse hypokinesis and moderate to severe mitral regurgitation. Cardiomyopathy likely due to uncontrolled HTN and ETOH abuse - cath deferred due to renal insufficiency  (felt due to uncontrolled HTN). bJodie Echevaria MV 06/2011: EF 37% and no ischemia or infarction. c. EF 45-50% by echo 01/2012.  [name] Chronic sinusitis   . CKD (chronic kidney disease)    a. 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  peak creatinine 3.46 during admx for aortic dissection 2/16.  sees Dr [name]  . Colon polyps 09/11/2015   Descending, sigmoid polyps  . DDD (degenerative disc disease), lumbar   . Descending thoracic aortic aneurysm ([place])   . Dissecting aneurysm of thoracic aorta ([place])   . ETOH abuse    a. Reported to have quit 05/2011.  [name] GERD (gastroesophageal reflux disease)   . Headache(784.0)    "q other day" (08/08/2013)  . Heart murmur   . Hemorrhoid thrombosis   . History of echocardiogram    Echo 1/17:  Severe LVH, EF 55-60%, no RWMA, Gr 2 DD, AVR ok, mild to mod MR, mild LAE, mild reduced RVSF, mod RAE  . History of medication noncompliance   . HYPERLIPIDEMIA   . Hypertension    a. Hx of HTN urgency secondary to noncompliance. b. urinary metanephrine and catecholeamine levels normal 2013.  c. Renal art [name] 1/16:  No evidence of renal artery stenosis noted bilaterally.  . INGUINAL HERNIA   . Pneumonia ~ 2013  . Renal insufficiency   . Tobacco abuse   . Valvular heart disease    a. Echo 05/2011: moderate to severe eccentric MR  and mild to moderate AI with prolapsing left coronary cusp. b. Echo 01/2012: mild-mod AI, mild dilitation of aortic root, mild MR.;  c. Echo 1/16: Severe LVH consistent with hypertrophic cardio myopathy, EF 50%, no RWMA, mod AI, mild MR, mild RAE, dilated Ao root (40 mm);     Past Surgical History:  Procedure Laterality Date  . ANKLE SURGERY Bilateral    Fractures bilaterally  . AORTIC VALVE SURGERY  09/2014  . FOOT FRACTURE SURGERY Bilateral 2004-2010   "got pins in both of them"  . HEMORRHOID SURGERY N/A 06/15/2015   Procedure: HEMORRHOIDECTOMY;  Surgeon: Stark Klein, MD;  Location: [facility];  Service: General;  Laterality: N/A;  . INGUINAL HERNIA REPAIR Right  ~ 1996  . LEFT HEART CATHETERIZATION WITH CORONARY ANGIOGRAM N/A 06/21/2014   Procedure: LEFT HEART CATHETERIZATION WITH CORONARY ANGIOGRAM;  Surgeon: Larey Dresser, MD;  Location: [facility] CATH LAB;  Service: Cardiovascular;  Laterality: N/A;  . TEE WITHOUT CARDIOVERSION N/A 03/12/2016   Procedure: TRANSESOPHAGEAL ECHOCARDIOGRAM (TEE);  Surgeon: Sanda Klein, MD;  Location: [facility] ENDOSCOPY;  Service: Cardiovascular;  Laterality: N/A;    There were no vitals filed for this visit.      Subjective Assessment - 03/18/16 1450    Subjective  I know I am supposed to check my BP   Patient is accompained by: Family member  wife   Pertinent History see epic    Patient Stated Goals I want more ability to use my right arm   Currently in Pain? No/denies           [facility] OT Assessment - 03/18/16 1453      Assessment   Diagnosis L MCA CVA   Referring Provider Dr. Lottie Rater   Onset Date 02/28/16   Prior Therapy acute therapies only     Precautions   Precautions None     Restrictions   Weight Bearing Restrictions No     Balance Screen   Has the patient fallen in the past 6 months No     Home  Environment   Family/patient expects to be discharged to: Private residence   [place] other  3, 41, 44, 81 year old children   Available Help at Discharge Available PRN/intermittently   Type of [place] One level   Bathroom Shower/Tub Haematologist   Additional Comments Pt has no equipment in bathroom     Prior Function   Level of Independence Independent   Vocation Part time employment   Contractor; electrician     ADL   Eating/Feeding Minimal assistance  pt using both hands to self feed   Grooming Moderate assistance  using non dominant hand, needs assist to shave and do hair   Upper Body Bathing Modified independent   Lower Body Bathing Modified independent   Upper Body Dressing Increased  time   Lower Body Dressing Increased time   Toilet Tranfer Independent   Toileting - Clothing Manipulation Increased time   [place] Increase time   Clinical cytogeneticist Independent     IADL   Shopping Needs to be accompanied on any shopping trip   Light Housekeeping Needs help with all home maintenance tasks  "it will take too long and I feel too tired"   Meal Prep Able to complete simple warm meal prep   Community Mobility Relies on family or friends for transportation   Medication Management Has difficulty remembering to  take medication  pt and wife state just because it is all new   Financial Management Requires assistance     Mobility   Mobility Status Needs assist   Mobility Status Comments pt states he prefers supervision in the community     Written Expression   Dominant Hand Right   Handwriting 90% legible  pt reports this is how he wrote before     Vision - History   Visual History --  pt reports he is near sighted but doesnt wear glasses     Vision Assessment   Eye Alignment Impaired (comment)   Ocular Range of Motion Within Functional Limits   Tracking/Visual Pursuits Able to track stimulus in all quads without difficulty   Convergence Impaired (comment)  in left inferior field   Visual Fields No apparent deficits   Diplopia Assessment Objects split side to side     Activity Tolerance   Activity Tolerance Tolerates 30 min activity with muliple rests     Cognition   Overall Cognitive Status Within Functional Limits for tasks assessed   Mini Mental State Exam  Pt reports word finding and word subsitution deficits.  This is also apparent during this eval.  After significant questioning this appears to be language based and not necessarily cognitive based however will continue to monitor     Sensation   Light Touch Impaired by gross assessment  dull in forearm, absent in hand   Hot/Cold Impaired by gross assessment  absent in hand, dull in R  forearm   Proprioception Impaired by gross assessment  absent   Additional Comments pt feels deep pressure in hand only     Coordination   Gross Motor Movements are Fluid and Coordinated Yes   9 Hole Peg Test Right   Right 9 Hole Peg Test 41.01   Box and Blocks 44  difficulty due primarily to sensory issues.      Praxis   Praxis Impaired   Praxis Impairment Details Ideomotor     Tone   Assessment Location Right Upper Extremity     ROM / Strength   AROM / PROM / Strength AROM;Strength     AROM   Overall AROM  Within functional limits for tasks performed   Overall AROM Comments BUE's     Hand Function   Right Hand Gross Grasp Functional   Right Hand Grip (lbs) 140    Left Hand Gross Grasp Functional   Left Hand Grip (lbs) 150     RUE Tone   RUE Tone Within Functional Limits                           OT Short Term Goals - 03/18/16 1626      OT SHORT TERM GOAL #1   Title Pt will be mod I with HEP - 04/15/2016   Status New     OT SHORT TERM GOAL #2   Title Pt will need no more than min vc's to use vision to compensate for sensory loss of R hand   Status New     OT SHORT TERM GOAL #3   Title Pt will verbalize understanding of safety precautions for sensory loss of R hand   Status New     OT SHORT TERM GOAL #4   Title Pt will be supervision for simple hot meal prep    Status New     OT SHORT TERM GOAL #5   Title  Pt will be supervision with shaving with electric razor   Status New           OT Long Term Goals - 03/18/16 1628      OT LONG TERM GOAL #1   Title Pt will be mod I with upgraded HEP - 05/13/2016   Status New     OT LONG TERM GOAL #2   Title Pt will be mod I with simple hot meal prep AE prn for safety due to loss of sensory of R hand   Status New     OT LONG TERM GOAL #3   Title Pt will be mod I for shaving   Status New     OT LONG TERM GOAL #4   Title Pt will decrease time on 9 hole peg by at least 5 seconds  (baseline=41.01) as evidenced of improved fine motor control for functional tasks.    Status New     OT LONG TERM GOAL #5   Title Pt will improve on box and blocks by at least 4 blocks (baseline=44) as evidence of improved functional use of RUE   Status New     OT LONG TERM GOAL #6   Title Pt will use screwdriver mod I for simple home mgmt tasks as prep for return to part time work    Status New               Plan - 03/18/16 1631    Clinical Impression Statement Pt is a 46 year old male s/p seizure, cardiac arrest and L CVA on 02/28/2016. Pt discharged home on 03/06/2016.  PMH: HTN, heart murmur, cardiomegaly, anxiety, depression, ETOH abuse , aortic dissection, HLD, tobacco use, marijuana use.  Pt presents with the following deficits that impact his ability to complete ADl, IADL, leisure and work activities:  Ideomotor apraxia, sensory impairment RUE, intermittent parasthesias of R hand, decreased coordination RUE, decreased functional use of R dominan UE, intermittent diplopia, decreased activity tolerance.  Pt will benefit from skilled OT to address these deficits to maximize independence.    Rehab Potential Good   OT Frequency 2x / week   OT Duration 8 weeks   OT Treatment/Interventions Self-care/ADL training;Ultrasound;Moist Heat;Electrical Stimulation;Fluidtherapy;DME and/or AE instruction;Neuromuscular education;Therapeutic exercise;Therapeutic activities;Patient/family education   Plan initiate HEP as possible   Consulted and Agree with Plan of Care Patient;Family member/caregiver   Family Member Consulted wife      Patient will benefit from skilled therapeutic intervention in order to improve the following deficits and impairments:  Decreased activity tolerance, Decreased knowledge of precautions, Decreased coordination, Decreased knowledge of use of DME, Impaired UE functional use, Impaired sensation, Impaired vision/preception, Pain  Visit Diagnosis: Other lack of  coordination - Plan: Ot plan of care cert/re-cert  Apraxia - Plan: Ot plan of care cert/re-cert  Other symptoms and signs involving the nervous system - Plan: Ot plan of care cert/re-cert  Visuospatial deficit - Plan: Ot plan of care cert/re-cert      G-Codes - 50/09/38 1639    Functional Assessment Tool Used 9 hole peg, box and blocks   Functional Limitation Carrying, moving and handling objects   Carrying, Moving and Handling Objects Current Status (H8299) At least 60 percent but less than 80 percent impaired, limited or restricted   Carrying, Moving and Handling Objects Goal Status (B7169) At least 20 percent but less than 40 percent impaired, limited or restricted      Problem List Patient Active Problem List   Diagnosis  Date Noted  . Palpitations   . Cerebrovascular accident (CVA) due to embolism of left middle cerebral artery ([place])   . Malnutrition of moderate degree 02/29/2016  . Seizure ([place]) 02/28/2016  . HLD (hyperlipidemia) 08/28/2015  . Thrombosed hemorrhoids 06/14/2015  . Rectal bleeding 06/14/2015  . Rectal pain 06/14/2015  . 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[place]) 06/07/2015  . Cardiomyopathy ([place]) 03/01/2015  . Acid reflux 03/01/2015  . Essential hypertension 03/01/2015  . Cardiac chest pain 12/10/2014  . Abdominal pain 10/12/2014  . Injury of kidney 10/03/2014  . S/P aortic dissection repair 10/03/2014  . H/O aortic valve replacement 10/03/2014  . Pre-op testing 06/15/2014  . CKD (chronic kidney disease) 05/15/2014  . Hypoxia   . AKI (acute kidney injury) ([place])   . Aortic dissection, thoracoabdominal ([place])   . Dissecting aneurysm of thoracic aorta, Stanford type B ([place]) 04/24/2014  . Aortic dissection, thoracic ([place]) 04/24/2014  . Aortic dissection ([place]) 04/23/2014  . Aneurysm of ascending aorta (HCC) 03/28/2014  . Dyspnea 03/27/2014  . Prolonged Q-T interval on ECG 03/27/2014  . ETOH abuse 09/20/2013  . Hypertensive crisis 08/08/2013  .  Sinusitis, bacterial 08/08/2013  . Malignant hypertension 02/20/2013  . Hypertensive urgency 01/27/2012  . Cardiomyopathy, hypertensive ([place]) 01/26/2012  . AI (aortic insufficiency) 01/26/2012  . MR (mitral regurgitation) 01/26/2012  . Acute renal failure superimposed on stage 3 chronic kidney disease ([place]) 01/26/2012  . Systolic CHF, chronic ([place]) 06/10/2011  . Chronic bronchitis 05/23/2011  . Hypertensive emergency 05/22/2011  . Cardiomegaly - hypertensive 05/22/2011  . Tobacco abuse 05/22/2011  . Marijuana use 05/22/2011  . Abdominal pain 05/22/2011  . Hypertension, malignant 05/22/2011  . Anxiety and depression 05/22/2011  . Hyperlipidemia 08/26/2009  . Tobacco use 08/26/2009  . Headache(784.0) 08/26/2009  . Aortic valve disorder 03/11/2009  . INGUINAL HERNIA 02/18/2009  . Uncontrolled hypertension 01/16/2009  . MURMUR 01/16/2009    Quay Burow 03/18/2016, 4:41 PM  [facility] [address] [place], [place], [address] Phone: [phone]   Fax:  [phone]  Name: [name] MRN: [id number] Date of Birth: Mar 08, 1971

## 2016-03-19 ENCOUNTER — Ambulatory Visit: Payer: Medicare Other | Admitting: Occupational Therapy

## 2016-03-20 ENCOUNTER — Other Ambulatory Visit: Payer: Self-pay

## 2016-03-20 NOTE — Patient Outreach (Signed)
[facility]) Care Management  03/20/2016  [name] Mar 21, 1970 [id number]     EMMI-Stroke RED ON EMMI ALERT Day # 9 Date: 03/19/16 Red Alert Reason: "Sad,hopeless,anxious, or empty? Yes"    Outreach attempt #1 to patient. Not accepting calls at patient's primary number. Contacted alternate contact number. Discussed and reviewed red alert. 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 Discussed that this is a common and normal symptom to be experiencing after a stroke and offered suggestions/ways to manage this. Encouraged patient to speak with MD regarding the issue if symptoms persist and/or worsen for possible treatment options. Patient is getting outpatient PT and OT. He complains of occasional cramping to weakened arm but was told that this is common following a stroke and to continue to work/exercise arm muscles. No issues with meds. Patient has reliable transportation. He has stroke MD f/u appt on 06/02/16. He saw PA at PCP office earlier this week. He was placed on 30day heart monitor for further eval. Advised that patient would continue to get automated EMMI stroke calls over the next few days and will receive call if any responses are abnormal. Understanding verbalized and was appreciative of call.     Plan: RN CM will notify [facility] administrative assistant of case status. RN CM will send educational materials to patient via mail regarding stroke prevention & discharge home following a stroke.   Enzo Montgomery, RN,BSN,CCM [place] Management Telephonic Care Management Coordinator Direct Phone: [phone] Toll Free: [phone] Fax: [phone]

## 2016-03-23 ENCOUNTER — Ambulatory Visit: Payer: Medicare Other | Admitting: Occupational Therapy

## 2016-03-23 ENCOUNTER — Telehealth: Payer: Self-pay | Admitting: Internal Medicine

## 2016-03-23 NOTE — Telephone Encounter (Signed)
Patient Name: [name]  DOB: 03/15/1971    Initial Comment Caller states husband is complaining of being weak, doesn't want to do anything and does not want to get out of bed. Wanting to speak to someone regarding this issue.    Nurse Assessment  Nurse: Raphael Gibney, RN, Vanita Ingles Date/Time (Eastern Time): 03/23/2016 2:50:52 PM  Confirm and document reason for call. If symptomatic, describe symptoms. ---Caller states he is weak. He takes 19 pills a day. He is cold. he can hardly eat. He is dizzy when he gets up. Came home from hospital last week for CVA.  Does the patient have any new or worsening symptoms? ---Yes  Will a triage be completed? ---Yes  Related visit to physician within the last 2 weeks? ---No  Does the PT have any chronic conditions? (i.e. diabetes, asthma, etc.) ---Yes  List chronic conditions. ---heart problems; aortic valve repair; CVA  Is this a behavioral health or substance abuse call? ---No     Guidelines    Guideline Title Affirmed Question Affirmed Notes  Dizziness - Lightheadedness [1] MODERATE dizziness (e.g., interferes with normal activities) AND [2] has NOT been evaluated by physician for this (Exception: dizziness caused by heat exposure, sudden standing, or poor fluid intake)    Final Disposition User   See Physician within 24 Hours [place], RN, Vanita Ingles    Comments  caller states she is not with her spouse. He is at home. the secondary number is his.  No appts available within 24 hrs at the [place] office. Pt does not want to go to another office. please call pt back regarding appt.   Referrals  GO TO FACILITY REFUSED   Disagree/Comply: Comply

## 2016-03-24 ENCOUNTER — Ambulatory Visit: Payer: Medicare Other | Admitting: Occupational Therapy

## 2016-03-24 DIAGNOSIS — R4701 Aphasia: Secondary | ICD-10-CM | POA: Diagnosis not present

## 2016-03-24 DIAGNOSIS — R278 Other lack of coordination: Secondary | ICD-10-CM | POA: Diagnosis not present

## 2016-03-24 DIAGNOSIS — R29818 Other symptoms and signs involving the nervous system: Secondary | ICD-10-CM | POA: Diagnosis not present

## 2016-03-24 DIAGNOSIS — R41842 Visuospatial deficit: Secondary | ICD-10-CM

## 2016-03-24 DIAGNOSIS — R482 Apraxia: Secondary | ICD-10-CM

## 2016-03-24 NOTE — Therapy (Signed)
[facility] [address] [place], [place], [address] Phone: [phone]   Fax:  [phone]  Occupational Therapy Treatment  Patient Details  Name: [name] MRN: [id number] Date of Birth: [birth year] Referring Provider: Dr. Lottie Rater  Encounter Date: 03/24/2016      OT End of Session - 03/24/16 1643    Visit Number 2   Number of Visits 16   Date for OT Re-Evaluation 05/13/16   Authorization Type medicare will need G code and PN every 10th visit   Authorization Time Period 60 days   Authorization - Visit Number 2   Authorization - Number of Visits 10   OT Start Time 1531   OT Stop Time 1613   OT Time Calculation (min) 42 min   Activity Tolerance Patient tolerated treatment well      Past Medical History:  Diagnosis Date  . Adenomatous colon polyp 08/2015  . Anxiety   . Aortic disease ([place])   . Aortic dissection (HCC)    a. admx 04/2014 >> L renal infarct; a/c renal failure >> b.  s/p Bioprosthetic Bentall and total arch replacement and staged endovascular repair of descending aortic aneurysm ([facility] - Dr. [name])  . CAD (coronary artery disease)    a. LHC 4/16:  oD1 60%  . Cardiomyopathy ([place])    a. non-ischemic - probably related to untreated HTN and ETOH abuse - Echo 3/13 with EF 35-40% >> b. Echo 4/16: Severe LVH, EF 55-60%, moderate AI, moderate MR, mild LAE, trivial effusion, known type B dissection with communication between true and false lumens with suprasternal images suggesting dissection plane may propagate to at least left subclavian takeoff, root above aortic valve okay    . Chronic abdominal pain   . Chronic combined systolic and diastolic congestive heart failure ([place])    a. 05/2011: Adm with pulm edema/HTN urgency, EF 35-40% with diffuse hypokinesis and moderate to severe mitral regurgitation. Cardiomyopathy likely due to uncontrolled HTN and ETOH abuse - cath deferred due to renal insufficiency  (felt due to uncontrolled HTN). bJodie Echevaria MV 06/2011: EF 37% and no ischemia or infarction. c. EF 45-50% by echo 01/2012.  [name] Chronic sinusitis   . CKD (chronic kidney disease)    a. 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  peak creatinine 3.46 during admx for aortic dissection 2/16.  sees Dr [name]  . Colon polyps 09/11/2015   Descending, sigmoid polyps  . DDD (degenerative disc disease), lumbar   . Descending thoracic aortic aneurysm ([place])   . Dissecting aneurysm of thoracic aorta ([place])   . ETOH abuse    a. Reported to have quit 05/2011.  [name] GERD (gastroesophageal reflux disease)   . Headache(784.0)    "q other day" (08/08/2013)  . Heart murmur   . Hemorrhoid thrombosis   . History of echocardiogram    Echo 1/17:  Severe LVH, EF 55-60%, no RWMA, Gr 2 DD, AVR ok, mild to mod MR, mild LAE, mild reduced RVSF, mod RAE  . History of medication noncompliance   . HYPERLIPIDEMIA   . Hypertension    a. Hx of HTN urgency secondary to noncompliance. b. urinary metanephrine and catecholeamine levels normal 2013.  c. Renal art [name] 1/16:  No evidence of renal artery stenosis noted bilaterally.  . INGUINAL HERNIA   . Pneumonia ~ 2013  . Renal insufficiency   . Tobacco abuse   . Valvular heart disease    a. Echo 05/2011: moderate to severe eccentric MR  and mild to moderate AI with prolapsing left coronary cusp. b. Echo 01/2012: mild-mod AI, mild dilitation of aortic root, mild MR.;  c. Echo 1/16: Severe LVH consistent with hypertrophic cardio myopathy, EF 50%, no RWMA, mod AI, mild MR, mild RAE, dilated Ao root (40 mm);     Past Surgical History:  Procedure Laterality Date  . ANKLE SURGERY Bilateral    Fractures bilaterally  . AORTIC VALVE SURGERY  09/2014  . FOOT FRACTURE SURGERY Bilateral 2004-2010   "got pins in both of them"  . HEMORRHOID SURGERY N/A 06/15/2015   Procedure: HEMORRHOIDECTOMY;  Surgeon: Stark Klein, MD;  Location: [facility];  Service: General;  Laterality: N/A;  . INGUINAL HERNIA REPAIR Right  ~ 1996  . LEFT HEART CATHETERIZATION WITH CORONARY ANGIOGRAM N/A 06/21/2014   Procedure: LEFT HEART CATHETERIZATION WITH CORONARY ANGIOGRAM;  Surgeon: Larey Dresser, MD;  Location[facility] CATH LAB;  Service: Cardiovascular;  Laterality: N/A;  . TEE WITHOUT CARDIOVERSION N/A 03/12/2016   Procedure: TRANSESOPHAGEAL ECHOCARDIOGRAM (TEE);  Surgeon: Sanda Klein, MD;  Location: [facility] ENDOSCOPY;  Service: Cardiovascular;  Laterality: N/A;    There were no vitals filed for this visit.      Subjective Assessment - 03/24/16 1537    Subjective  The meds are making me sleepy - I called the dr but they haven't called back yet.   Patient is accompained by: Family member  wife in lobby   Pertinent History see epic    Patient Stated Goals I want more ability to use my right arm   Currently in Pain? No/denies                      OT Treatments/Exercises (OP) - 03/24/16 0001      Fine Motor Coordination   Other Fine Motor Exercises Issued HEP for fine motor control - see pt instructions for details. Pt able return demonstrate all activities. Discussed ways to modify program.  Also addresed in hand manipulation activities and eye hand coordination activities..  Pt with dense sensory loss                OT Education - 03/24/16 1642    Education provided Yes   Education Details HEP for [facility]   Person(s) Educated Patient   Methods Explanation;Demonstration;Verbal cues;Handout   Comprehension Verbalized understanding;Returned demonstration          OT Short Term Goals - 03/24/16 1642      OT SHORT TERM GOAL #1   Title Pt will be mod I with HEP - 04/15/2016   Status On-going     OT SHORT TERM GOAL #2   Title Pt will need no more than min vc's to use vision to compensate for sensory loss of R hand   Status On-going     OT SHORT TERM GOAL #3   Title Pt will verbalize understanding of safety precautions for sensory loss of R hand   Status On-going     OT SHORT TERM GOAL #4    Title Pt will be supervision for simple hot meal prep    Status On-going     OT SHORT TERM GOAL #5   Title Pt will be supervision with shaving with electric razor   Status On-going           OT Long Term Goals - 03/24/16 1642      OT LONG TERM GOAL #1   Title Pt will be mod I with upgraded HEP - 05/13/2016  Status On-going     OT LONG TERM GOAL #2   Title Pt will be mod I with simple hot meal prep AE prn for safety due to loss of sensory of R hand   Status On-going     OT LONG TERM GOAL #3   Title Pt will be mod I for shaving   Status On-going     OT LONG TERM GOAL #4   Title Pt will decrease time on 9 hole peg by at least 5 seconds (baseline=41.01) as evidenced of improved fine motor control for functional tasks.    Status On-going     OT LONG TERM GOAL #5   Title Pt will improve on box and blocks by at least 4 blocks (baseline=44) as evidence of improved functional use of RUE   Status On-going     OT LONG TERM GOAL #6   Title Pt will use screwdriver mod I for simple home mgmt tasks as prep for return to part time work    Status On-going               Plan - 03/24/16 1642    Clinical Impression Statement Pt progressing toward goals. Pt very motivated   Rehab Potential Good   OT Frequency 2x / week   OT Duration 8 weeks   OT Treatment/Interventions Self-care/ADL training;Ultrasound;Moist Heat;Electrical Stimulation;Fluidtherapy;DME and/or AE instruction;Neuromuscular education;Therapeutic exercise;Therapeutic activities;Patient/family education   Plan check HEP, coordination, functional use of R hand as dominant, writing   Consulted and Agree with Plan of Care Patient      Patient will benefit from skilled therapeutic intervention in order to improve the following deficits and impairments:  Decreased activity tolerance, Decreased knowledge of precautions, Decreased coordination, Decreased knowledge of use of DME, Impaired UE functional use, Impaired  sensation, Impaired vision/preception, Pain  Visit Diagnosis: Other lack of coordination  Apraxia  Other symptoms and signs involving the nervous system  Visuospatial deficit    Problem List Patient Active Problem List   Diagnosis Date Noted  . Palpitations   . Cerebrovascular accident (CVA) due to embolism of left middle cerebral artery ([place])   . Malnutrition of moderate degree 02/29/2016  . Seizure ([place]) 02/28/2016  . HLD (hyperlipidemia) 08/28/2015  . Thrombosed hemorrhoids 06/14/2015  . Rectal bleeding 06/14/2015  . Rectal pain 06/14/2015  . 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[place]) 06/07/2015  . Cardiomyopathy ([place]) 03/01/2015  . Acid reflux 03/01/2015  . Essential hypertension 03/01/2015  . Cardiac chest pain 12/10/2014  . Abdominal pain 10/12/2014  . Injury of kidney 10/03/2014  . S/P aortic dissection repair 10/03/2014  . H/O aortic valve replacement 10/03/2014  . Pre-op testing 06/15/2014  . CKD (chronic kidney disease) 05/15/2014  . Hypoxia   . AKI (acute kidney injury) ([place])   . Aortic dissection, thoracoabdominal ([place])   . Dissecting aneurysm of thoracic aorta, Stanford type B ([place]) 04/24/2014  . Aortic dissection, thoracic ([place]) 04/24/2014  . Aortic dissection ([place]) 04/23/2014  . Aneurysm of ascending aorta (HCC) 03/28/2014  . Dyspnea 03/27/2014  . Prolonged Q-T interval on ECG 03/27/2014  . ETOH abuse 09/20/2013  . Hypertensive crisis 08/08/2013  . Sinusitis, bacterial 08/08/2013  . Malignant hypertension 02/20/2013  . Hypertensive urgency 01/27/2012  . Cardiomyopathy, hypertensive ([place]) 01/26/2012  . AI (aortic insufficiency) 01/26/2012  . MR (mitral regurgitation) 01/26/2012  . Acute renal failure superimposed on stage 3 chronic kidney disease ([place]) 01/26/2012  . Systolic CHF, chronic ([place]) 06/10/2011  . Chronic bronchitis 05/23/2011  . Hypertensive  emergency 05/22/2011  . Cardiomegaly - hypertensive 05/22/2011  . Tobacco abuse 05/22/2011  .  Marijuana use 05/22/2011  . Abdominal pain 05/22/2011  . Hypertension, malignant 05/22/2011  . Anxiety and depression 05/22/2011  . Hyperlipidemia 08/26/2009  . Tobacco use 08/26/2009  . Headache(784.0) 08/26/2009  . Aortic valve disorder 03/11/2009  . INGUINAL HERNIA 02/18/2009  . Uncontrolled hypertension 01/16/2009  . MURMUR 01/16/2009    Quay Burow , OTR/L 03/24/2016, 4:44 PM  [facility] [address] [place], [place], [address] Phone: [phone]   Fax:  [phone]  Name: CAPERS HAGMANN MRN: [id number] Date of Birth: 09-08-1970

## 2016-03-24 NOTE — Patient Instructions (Signed)
  Coordination Activities  Perform the following activities for 15-20 minutes 1-2  times per day with right hand(s).   Rotate ball in fingertips (clockwise and counter-clockwise).  Toss ball between hands.  Toss ball in air and catch with the same hand.  Flip cards 1 at a time as fast as you can.  Pick up coins and place in container or coin bank.  Pick up coins and stack.  Pick up coins one at a time until you get 5-10 in your hand, then move coins from palm to fingertips to stack one at a time.  Practice writing and/or typing.  Screw together nuts and bolts, then unfasten.   When flipping cards you can flip from the bottom, the top or alternate. Make sure you work on all directions.  Don't worry about how fast you do it right now go for accuracy first.  We can introduce speed later.  Coins - you can move to dimes when pennies get too easy. Can also work on picking up stacks of 5.

## 2016-03-31 ENCOUNTER — Encounter: Payer: Self-pay | Admitting: Occupational Therapy

## 2016-03-31 ENCOUNTER — Other Ambulatory Visit: Payer: Self-pay | Admitting: Neurology

## 2016-03-31 ENCOUNTER — Ambulatory Visit: Payer: Medicare Other | Admitting: Occupational Therapy

## 2016-03-31 VITALS — BP 181/97 | HR 74

## 2016-03-31 DIAGNOSIS — R41842 Visuospatial deficit: Secondary | ICD-10-CM

## 2016-03-31 DIAGNOSIS — R29818 Other symptoms and signs involving the nervous system: Secondary | ICD-10-CM

## 2016-03-31 DIAGNOSIS — R4701 Aphasia: Secondary | ICD-10-CM | POA: Diagnosis not present

## 2016-03-31 DIAGNOSIS — R278 Other lack of coordination: Secondary | ICD-10-CM

## 2016-03-31 DIAGNOSIS — I63412 Cerebral infarction due to embolism of left middle cerebral artery: Secondary | ICD-10-CM

## 2016-03-31 DIAGNOSIS — R482 Apraxia: Secondary | ICD-10-CM

## 2016-03-31 NOTE — Therapy (Signed)
[facility] [address] [place], [place], [address] Phone: [phone]   Fax:  [phone]  Occupational Therapy Treatment  Patient Details  Name: [name] MRN: [id number] Date of Birth: 01-05-1971 Referring Provider: Dr. Lottie Rater  Encounter Date: 03/31/2016      OT End of Session - 03/31/16 1619    Visit Number 3   Number of Visits 16   Date for OT Re-Evaluation 05/13/16   Authorization Type medicare will need G code and PN every 10th visit   Authorization Time Period 60 days   Authorization - Visit Number 3   Authorization - Number of Visits 10   OT Start Time 7412   OT Stop Time 1613   OT Time Calculation (min) 42 min   Activity Tolerance Patient tolerated treatment well      Past Medical History:  Diagnosis Date  . Adenomatous colon polyp 08/2015  . Anxiety   . Aortic disease ([place])   . Aortic dissection (HCC)    a. admx 04/2014 >> L renal infarct; a/c renal failure >> b.  s/p Bioprosthetic Bentall and total arch replacement and staged endovascular repair of descending aortic aneurysm ([facility] - Dr. [name])  . CAD (coronary artery disease)    a. LHC 4/16:  oD1 60%  . Cardiomyopathy ([place])    a. non-ischemic - probably related to untreated HTN and ETOH abuse - Echo 3/13 with EF 35-40% >> b. Echo 4/16: Severe LVH, EF 55-60%, moderate AI, moderate MR, mild LAE, trivial effusion, known type B dissection with communication between true and false lumens with suprasternal images suggesting dissection plane may propagate to at least left subclavian takeoff, root above aortic valve okay    . Chronic abdominal pain   . Chronic combined systolic and diastolic congestive heart failure ([place])    a. 05/2011: Adm with pulm edema/HTN urgency, EF 35-40% with diffuse hypokinesis and moderate to severe mitral regurgitation. Cardiomyopathy likely due to uncontrolled HTN and ETOH abuse - cath deferred due to renal insufficiency  (felt due to uncontrolled HTN). bJodie Echevaria MV 06/2011: EF 37% and no ischemia or infarction. c. EF 45-50% by echo 01/2012.  [name] Chronic sinusitis   . CKD (chronic kidney disease)    a. 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  peak creatinine 3.46 during admx for aortic dissection 2/16.  sees Dr [name]  . Colon polyps 09/11/2015   Descending, sigmoid polyps  . DDD (degenerative disc disease), lumbar   . Descending thoracic aortic aneurysm ([place])   . Dissecting aneurysm of thoracic aorta ([place])   . ETOH abuse    a. Reported to have quit 05/2011.  [name] GERD (gastroesophageal reflux disease)   . Headache(784.0)    "q other day" (08/08/2013)  . Heart murmur   . Hemorrhoid thrombosis   . History of echocardiogram    Echo 1/17:  Severe LVH, EF 55-60%, no RWMA, Gr 2 DD, AVR ok, mild to mod MR, mild LAE, mild reduced RVSF, mod RAE  . History of medication noncompliance   . HYPERLIPIDEMIA   . Hypertension    a. Hx of HTN urgency secondary to noncompliance. b. urinary metanephrine and catecholeamine levels normal 2013.  c. Renal art [name] 1/16:  No evidence of renal artery stenosis noted bilaterally.  . INGUINAL HERNIA   . Pneumonia ~ 2013  . Renal insufficiency   . Tobacco abuse   . Valvular heart disease    a. Echo 05/2011: moderate to severe eccentric MR  and mild to moderate AI with prolapsing left coronary cusp. b. Echo 01/2012: mild-mod AI, mild dilitation of aortic root, mild MR.;  c. Echo 1/16: Severe LVH consistent with hypertrophic cardio myopathy, EF 50%, no RWMA, mod AI, mild MR, mild RAE, dilated Ao root (40 mm);     Past Surgical History:  Procedure Laterality Date  . ANKLE SURGERY Bilateral    Fractures bilaterally  . AORTIC VALVE SURGERY  09/2014  . FOOT FRACTURE SURGERY Bilateral 2004-2010   "got pins in both of them"  . HEMORRHOID SURGERY N/A 06/15/2015   Procedure: HEMORRHOIDECTOMY;  Surgeon: Stark Klein, MD;  Location: [facility];  Service: General;  Laterality: N/A;  . INGUINAL HERNIA REPAIR Right  ~ 1996  . LEFT HEART CATHETERIZATION WITH CORONARY ANGIOGRAM N/A 06/21/2014   Procedure: LEFT HEART CATHETERIZATION WITH CORONARY ANGIOGRAM;  Surgeon: Larey Dresser, MD;  Location: [facility] CATH LAB;  Service: Cardiovascular;  Laterality: N/A;  . TEE WITHOUT CARDIOVERSION N/A 03/12/2016   Procedure: TRANSESOPHAGEAL ECHOCARDIOGRAM (TEE);  Surgeon: Sanda Klein, MD;  Location: [facility];  Service: Cardiovascular;  Laterality: N/A;    Vitals:   03/31/16 1541  BP: (!) 181/97  Pulse: 74        Subjective Assessment - 03/31/16 1541    Subjective  My BP has been running so low is that why I feel tired (BP was actually 181/97 and pt realized he had  missed his last dose of BP meds)                      OT Treatments/Exercises (OP) - 03/31/16 0001      ADLs   ADL Comments Pt arrived today stating he feels very fatigued and "foggy" and that his BP at home has been running low (pt reports 120/65). Informed pt that this was actuallly a healthy BP however when vitals taken in the clinic BP actually 181/97.  Taken twice. Pt has been using an app on his phone to take BP and when measured against today's BP is not accurate.  Pt given readings in writing and instructed to call primary MD today with BP readings. Pt verbalized understanding and stated he would follow through.      Fine Motor Coordination   Other Fine Motor Exercises Therapeutic activities to address fine motor coordination and in hand coordination. Grooved peg board with min difficulty and Adult nurse with hand (not tweezer) with min difficulty. Pt has some spotty sensory return in R hand for light touch, temperature. Proprioception appears WNL's at this time in R hand. Activities focused on handling and manipulating multiple items in hand to complete a functional task.  Pt with moderate diffculty and attempting to use compensation - improved with practice and feedback.                   OT Short Term Goals -  03/31/16 1617      OT SHORT TERM GOAL #1   Title Pt will be mod I with HEP - 04/15/2016   Status On-going     OT SHORT TERM GOAL #2   Title Pt will need no more than min vc's to use vision to compensate for sensory loss of R hand   Status On-going     OT SHORT TERM GOAL #3   Title Pt will verbalize understanding of safety precautions for sensory loss of R hand   Status Achieved     OT SHORT TERM GOAL #4  Title Pt will be supervision for simple hot meal prep    Status On-going     OT SHORT TERM GOAL #5   Title Pt will be supervision with shaving with electric razor   Status Achieved           OT Long Term Goals - 03/31/16 1618      OT LONG TERM GOAL #1   Title Pt will be mod I with upgraded HEP - 05/13/2016   Status On-going     OT LONG TERM GOAL #2   Title Pt will be mod I with simple hot meal prep AE prn for safety due to loss of sensory of R hand   Status On-going     OT LONG TERM GOAL #3   Title Pt will be mod I for shaving   Status Achieved     OT LONG TERM GOAL #4   Title Pt will decrease time on 9 hole peg by at least 5 seconds (baseline=41.01) as evidenced of improved fine motor control for functional tasks.    Status On-going     OT LONG TERM GOAL #5   Title Pt will improve on box and blocks by at least 4 blocks (baseline=44) as evidence of improved functional use of RUE   Status On-going     OT LONG TERM GOAL #6   Title Pt will use screwdriver mod I for simple home mgmt tasks as prep for return to part time work    Status On-going               Plan - 03/31/16 1618    Clinical Impression Statement Pt progressing toward goals. Pt has improved in sensation, coordination and functional use of R hand. Pt is now shaving independently   Rehab Potential Good   OT Frequency 2x / week   OT Duration 8 weeks   OT Treatment/Interventions Self-care/ADL training;Ultrasound;Moist Heat;Electrical Stimulation;Fluidtherapy;DME and/or AE  instruction;Neuromuscular education;Therapeutic exercise;Therapeutic activities;Patient/family education   Plan cooking task, coordination, functional task of R hand as dominant, writing.   Consulted and Agree with Plan of Care Patient      Patient will benefit from skilled therapeutic intervention in order to improve the following deficits and impairments:  Decreased activity tolerance, Decreased knowledge of precautions, Decreased coordination, Decreased knowledge of use of DME, Impaired UE functional use, Impaired sensation, Impaired vision/preception, Pain  Visit Diagnosis: Other lack of coordination  Apraxia  Other symptoms and signs involving the nervous system  Visuospatial deficit    Problem List Patient Active Problem List   Diagnosis Date Noted  . Palpitations   . Cerebrovascular accident (CVA) due to embolism of left middle cerebral artery ([place])   . Malnutrition of moderate degree 02/29/2016  . Seizure ([place]) 02/28/2016  . HLD (hyperlipidemia) 08/28/2015  . Thrombosed hemorrhoids 06/14/2015  . Rectal bleeding 06/14/2015  . Rectal pain 06/14/2015  . 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[place]) 06/07/2015  . Cardiomyopathy ([place]) 03/01/2015  . Acid reflux 03/01/2015  . Essential hypertension 03/01/2015  . Cardiac chest pain 12/10/2014  . Abdominal pain 10/12/2014  . Injury of kidney 10/03/2014  . S/P aortic dissection repair 10/03/2014  . H/O aortic valve replacement 10/03/2014  . Pre-op testing 06/15/2014  . CKD (chronic kidney disease) 05/15/2014  . Hypoxia   . AKI (acute kidney injury) ([place])   . Aortic dissection, thoracoabdominal ([place])   . Dissecting aneurysm of thoracic aorta, Stanford type B ([place]) 04/24/2014  . Aortic dissection, thoracic ([place]) 04/24/2014  .  Aortic dissection ([place]) 04/23/2014  . Aneurysm of ascending aorta (HCC) 03/28/2014  . Dyspnea 03/27/2014  . Prolonged Q-T interval on ECG 03/27/2014  . ETOH abuse 09/20/2013  . Hypertensive crisis  08/08/2013  . Sinusitis, bacterial 08/08/2013  . Malignant hypertension 02/20/2013  . Hypertensive urgency 01/27/2012  . Cardiomyopathy, hypertensive ([place]) 01/26/2012  . AI (aortic insufficiency) 01/26/2012  . MR (mitral regurgitation) 01/26/2012  . Acute renal failure superimposed on stage 3 chronic kidney disease ([place]) 01/26/2012  . Systolic CHF, chronic ([place]) 06/10/2011  . Chronic bronchitis 05/23/2011  . Hypertensive emergency 05/22/2011  . Cardiomegaly - hypertensive 05/22/2011  . Tobacco abuse 05/22/2011  . Marijuana use 05/22/2011  . Abdominal pain 05/22/2011  . Hypertension, malignant 05/22/2011  . Anxiety and depression 05/22/2011  . Hyperlipidemia 08/26/2009  . Tobacco use 08/26/2009  . Headache(784.0) 08/26/2009  . Aortic valve disorder 03/11/2009  . INGUINAL HERNIA 02/18/2009  . Uncontrolled hypertension 01/16/2009  . MURMUR 01/16/2009    Quay Burow, OTR/L 03/31/2016, 4:21 PM  [facility] [address] [place], [place], [address] Phone: [phone]   Fax:  [phone]  Name: [name] MRN: [id number] Date of Birth: [birth year]

## 2016-04-02 ENCOUNTER — Encounter: Payer: Medicare Other | Admitting: Occupational Therapy

## 2016-04-05 IMAGING — CR DG CHEST 2V
2 series · 2 of 2 positions shown · non-contrast
Comparison: DG CHEST 2 VIEW dated 05/05/2013

CLINICAL DATA: Cough.

EXAM:
CHEST  2 VIEW

[w chest pa]
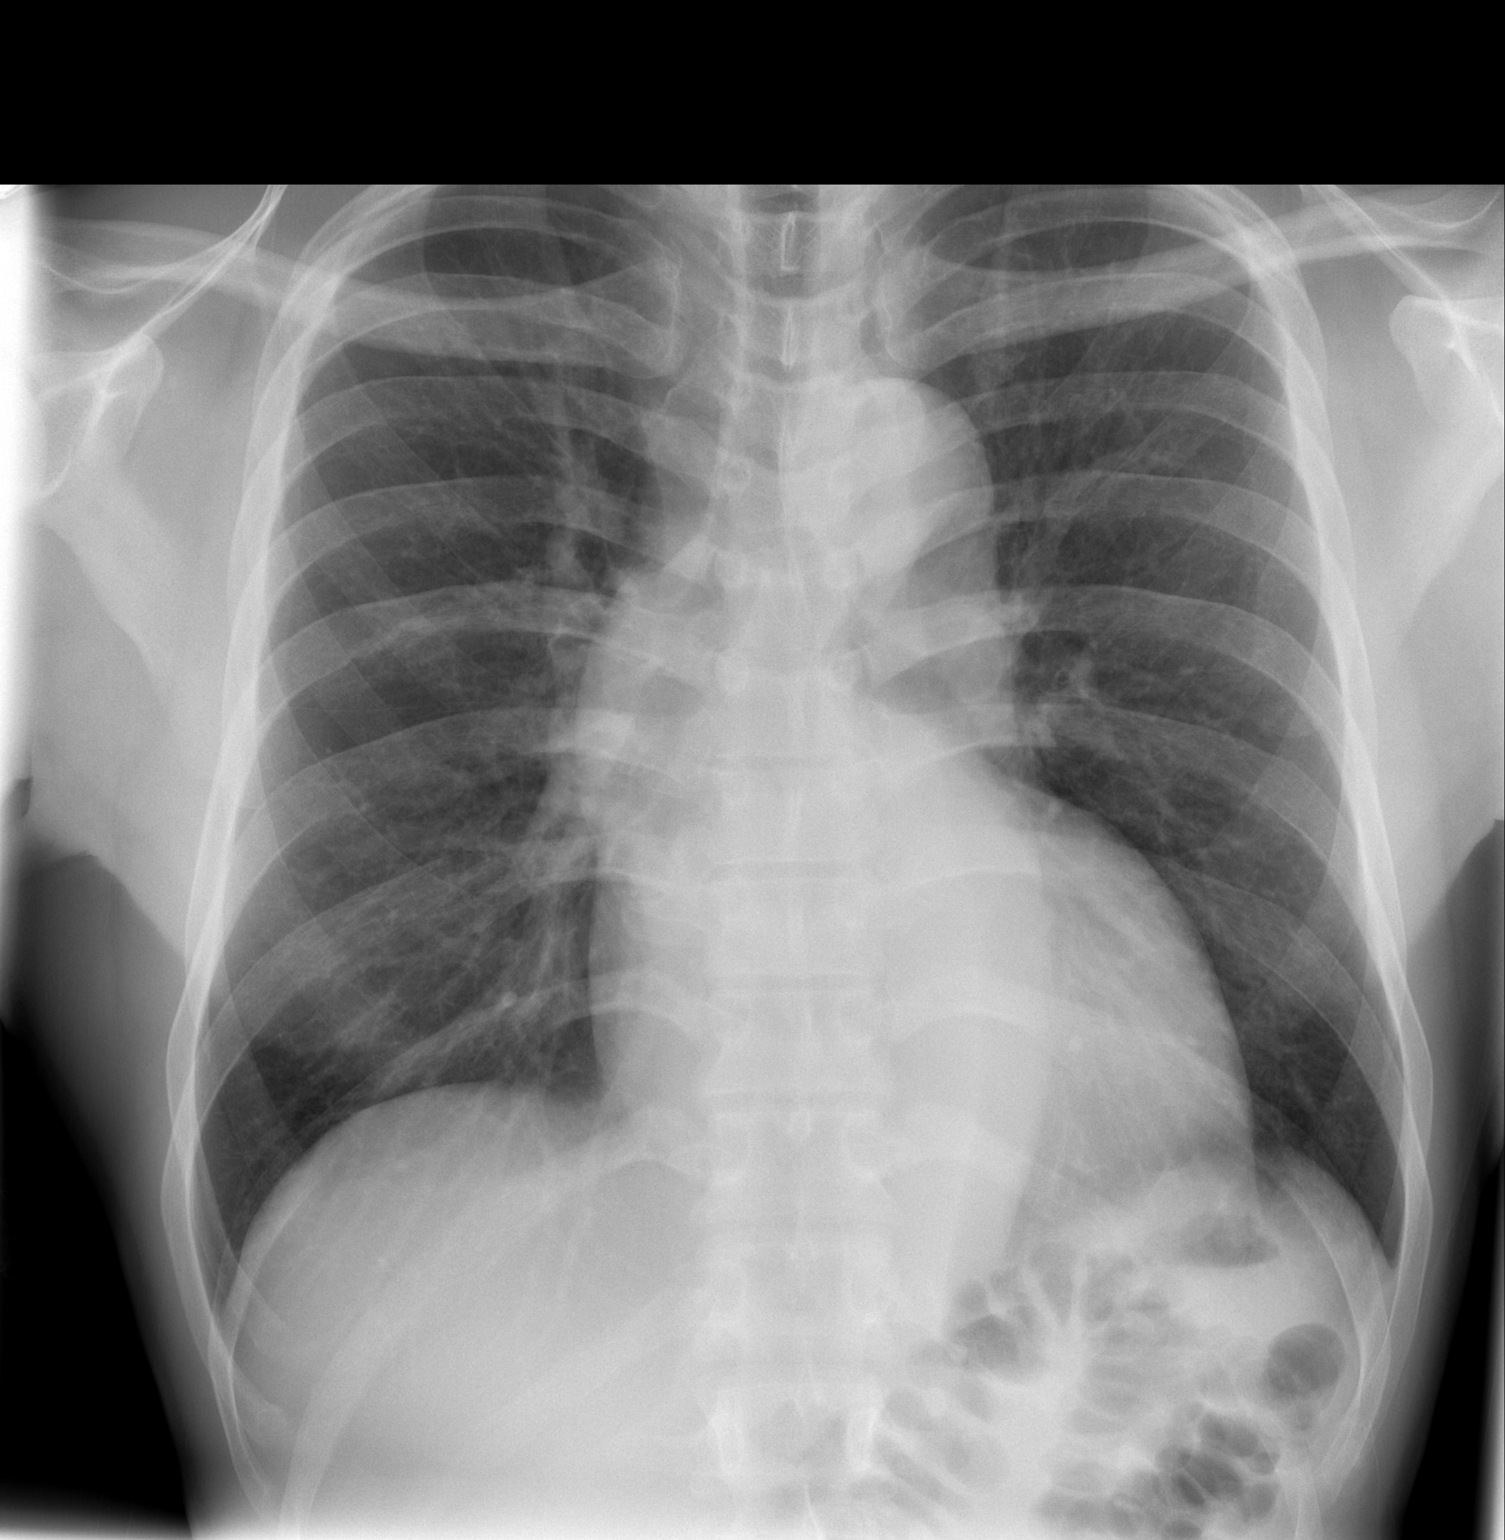

[w chest lat]
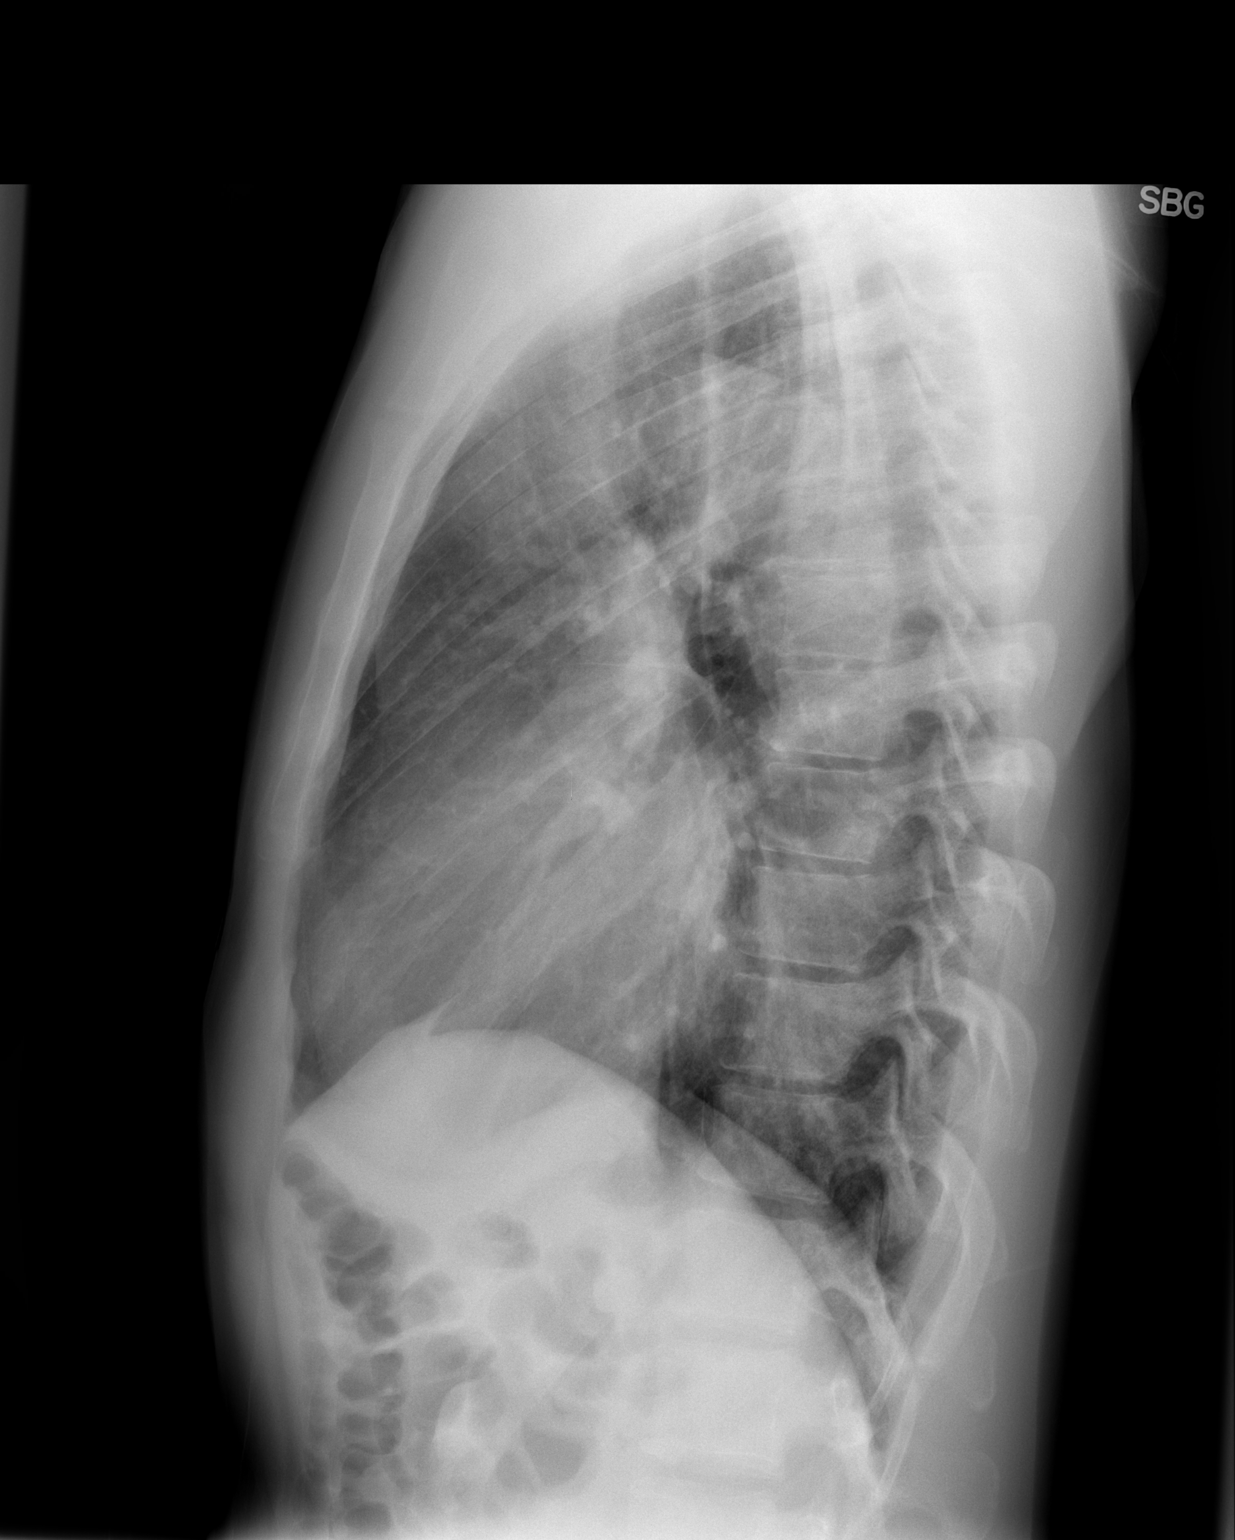

[2 of 2 positions shown; findings below may reference images not displayed]

FINDINGS: Mediastinum hilar structures are normal. The lungs are clear.
Cardiomegaly, no CHF. No pleural effusion or pneumothorax.
IMPRESSION: Cardiomegaly. This is stable. No evidence of overt congestive heart
failure.

## 2016-04-05 IMAGING — CT CT HEAD W/O CM
1 series · 16 of 30 positions shown, 20 images · non-contrast
Comparison: Head CT 05/06/2013.

CLINICAL DATA: Vomiting.

EXAM:
CT HEAD WITHOUT CONTRAST
TECHNIQUE: Contiguous axial images were obtained from the base of the skull
through the vertex without intravenous contrast.

[Series 2: head 5.0 h30s · axial · 0.44mm/px · z∈[-171,-36]mm · 16 of 31 slices shown, 20 images]
[im 2/31  brain]
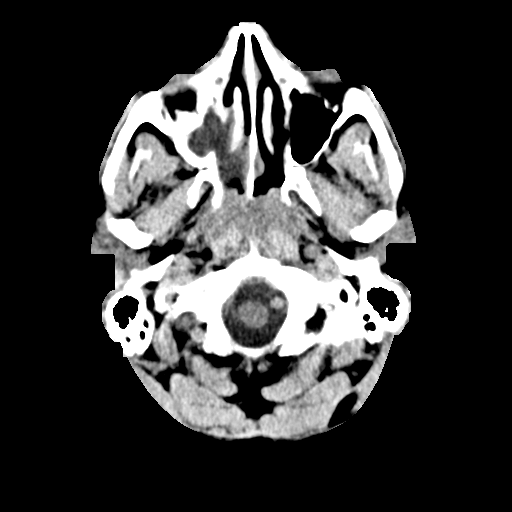
[im 2/31  bone]
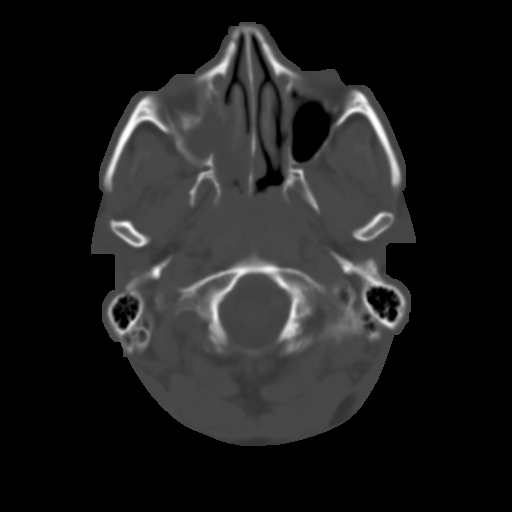
[im 4/31  brain]
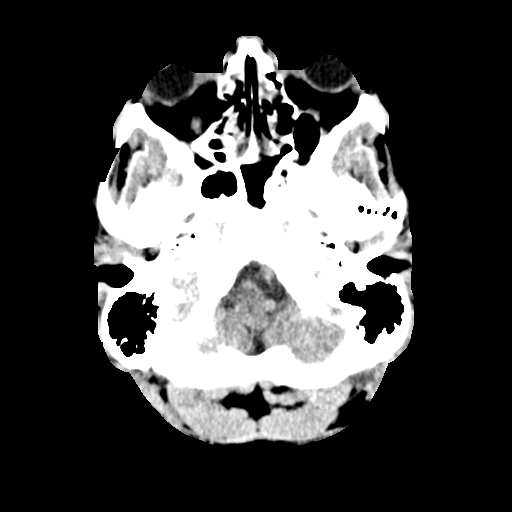
[im 6/31  brain]
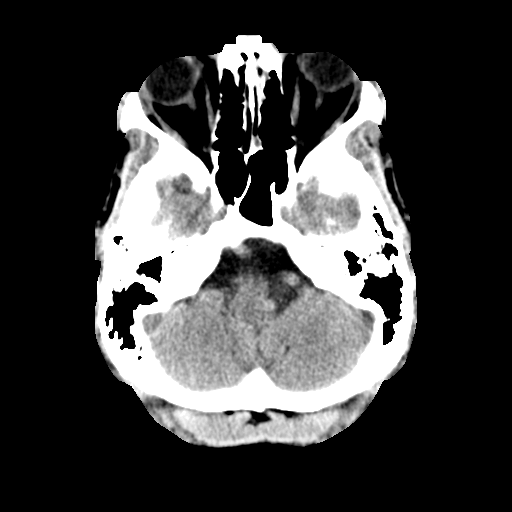
[im 8/31  brain]
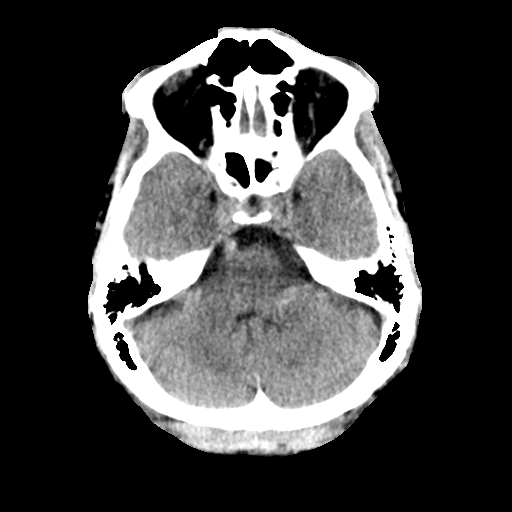
[im 9/31  brain]
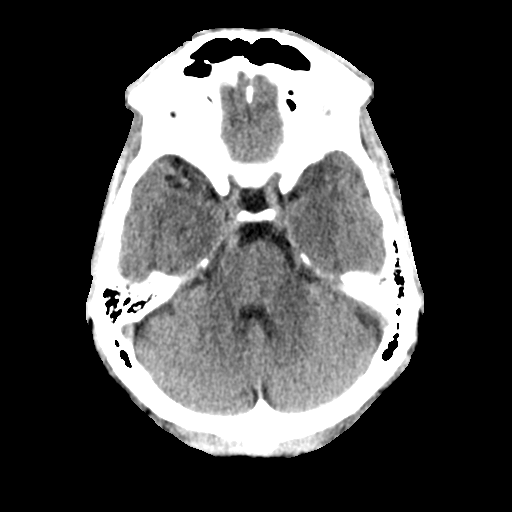
[im 9/31  bone]
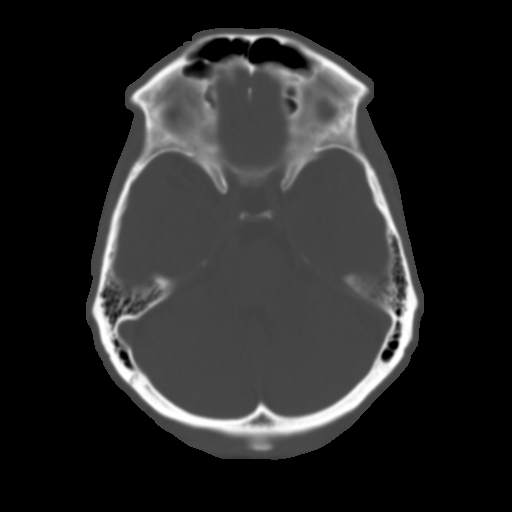
[im 11/31  brain]
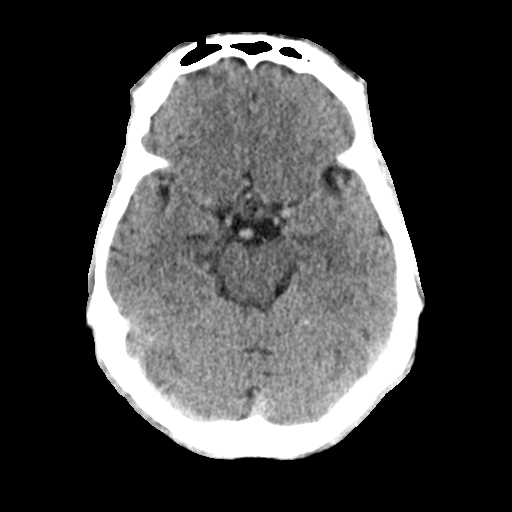
[im 13/31  brain]
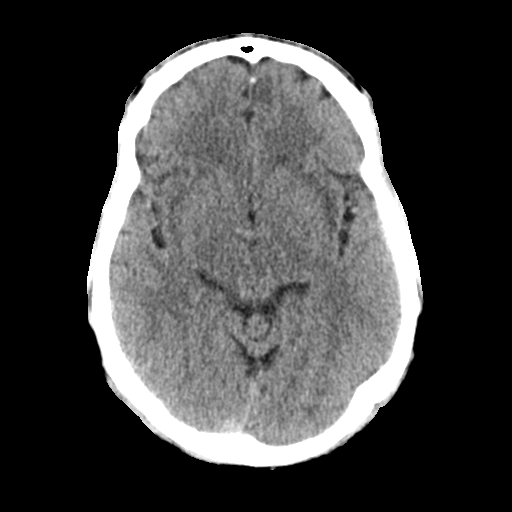
[im 15/31  brain]
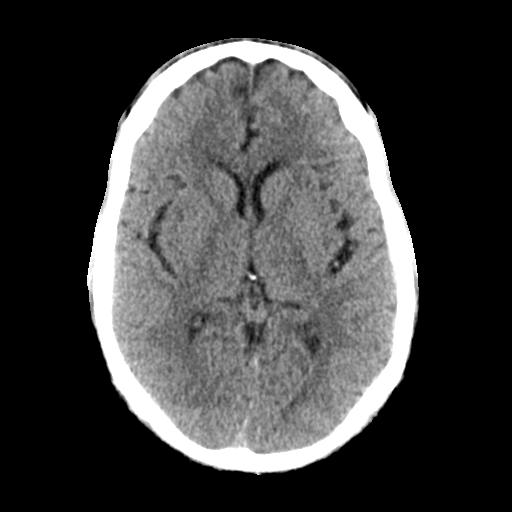
[im 16/31  brain]
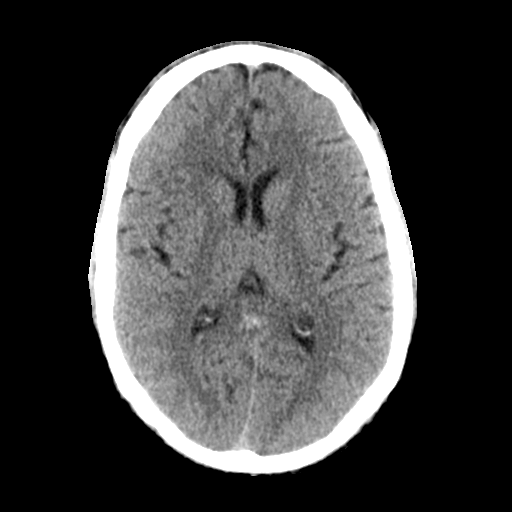
[im 16/31  bone]
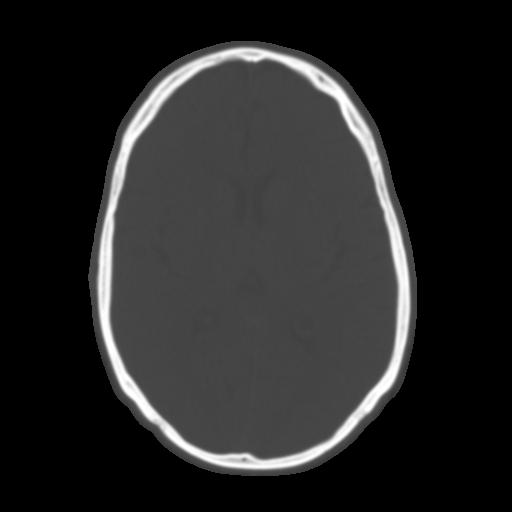
[im 18/31  brain]
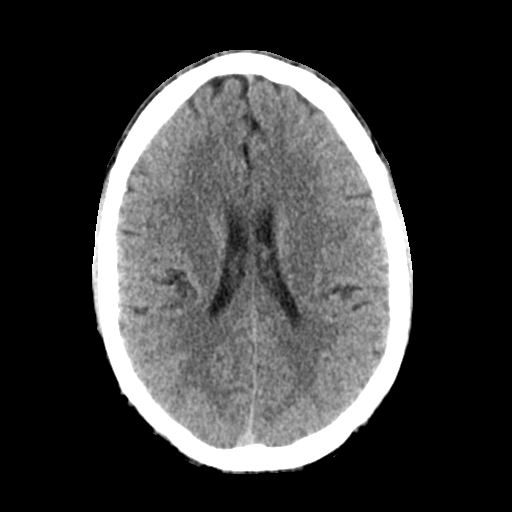
[im 20/31  brain]
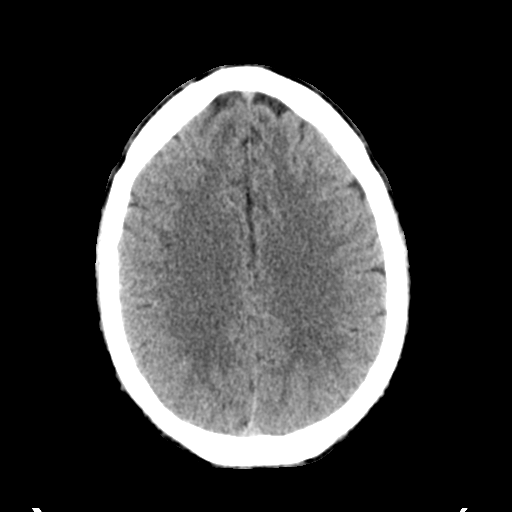
[im 22/31  brain]
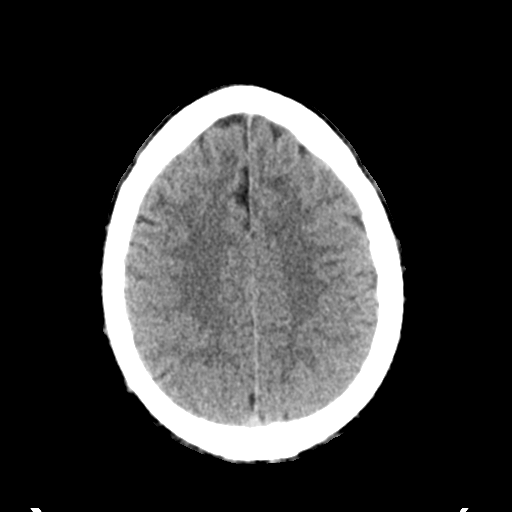
[im 23/31  brain]
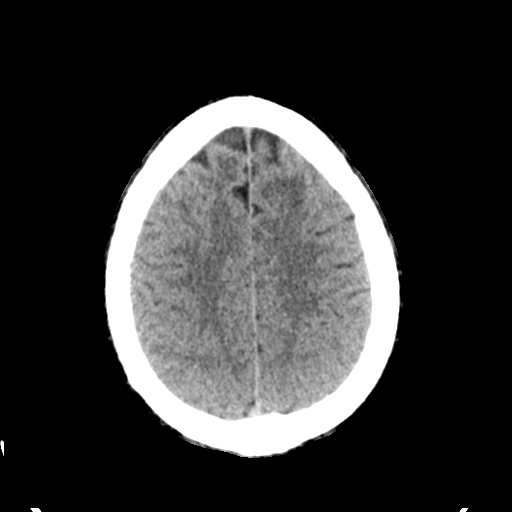
[im 23/31  bone]
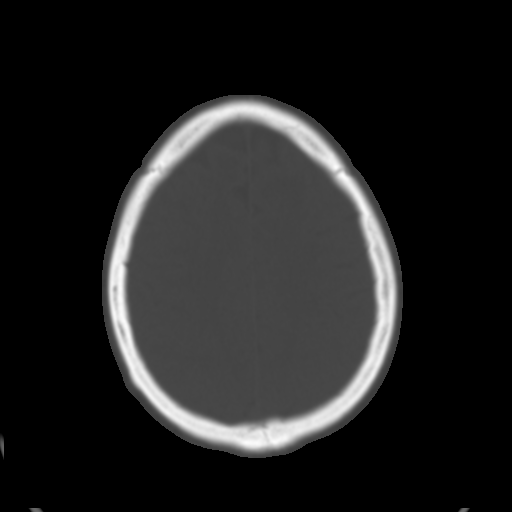
[im 25/31  brain]
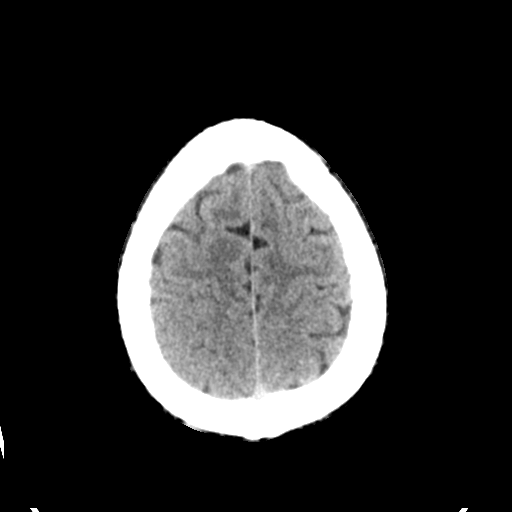
[im 27/31  brain]
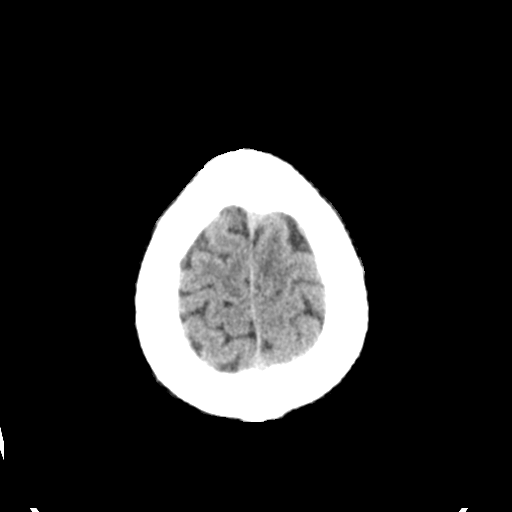
[im 29/31  brain]
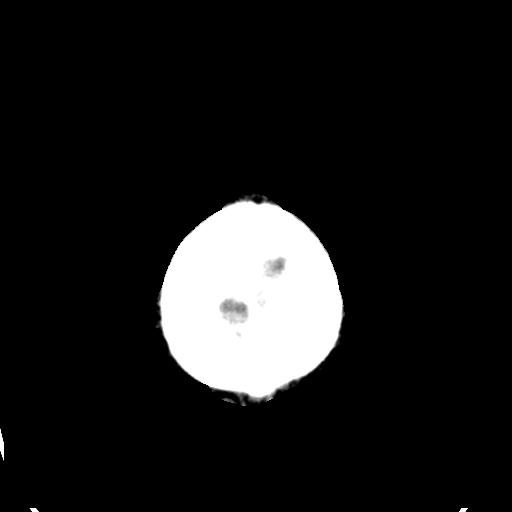

[16 of 30 positions shown; findings below may reference images not displayed]

FINDINGS: No mass. No hydrocephalus. No hemorrhage. No acute bony abnormality.
Opacification of the right maxillary sinus and the right
nasopharynx. Diffuse mucosal thickening of the ethmoidal sinuses. No
evidence of fracture or dislocation.
IMPRESSION: 1. No acute intracranial abnormality.
2. Opacification of the right maxillary sinus with mild mucosal
thickening in the ethmoid sinuses and mucosal thickening noted in
the right nasopharynx. These findings are most consistent with
sinusitis. Follow-up imaging is suggested to exclude a
nasopharyngeal or sinus mass.

## 2016-04-06 IMAGING — CR DG ABD PORTABLE 1V
1 series · 1 of 1 positions shown · non-contrast
Comparison: 05/22/2011

CLINICAL DATA: Abdominal pain

EXAM:
PORTABLE ABDOMEN - 1 VIEW

[supine ap]
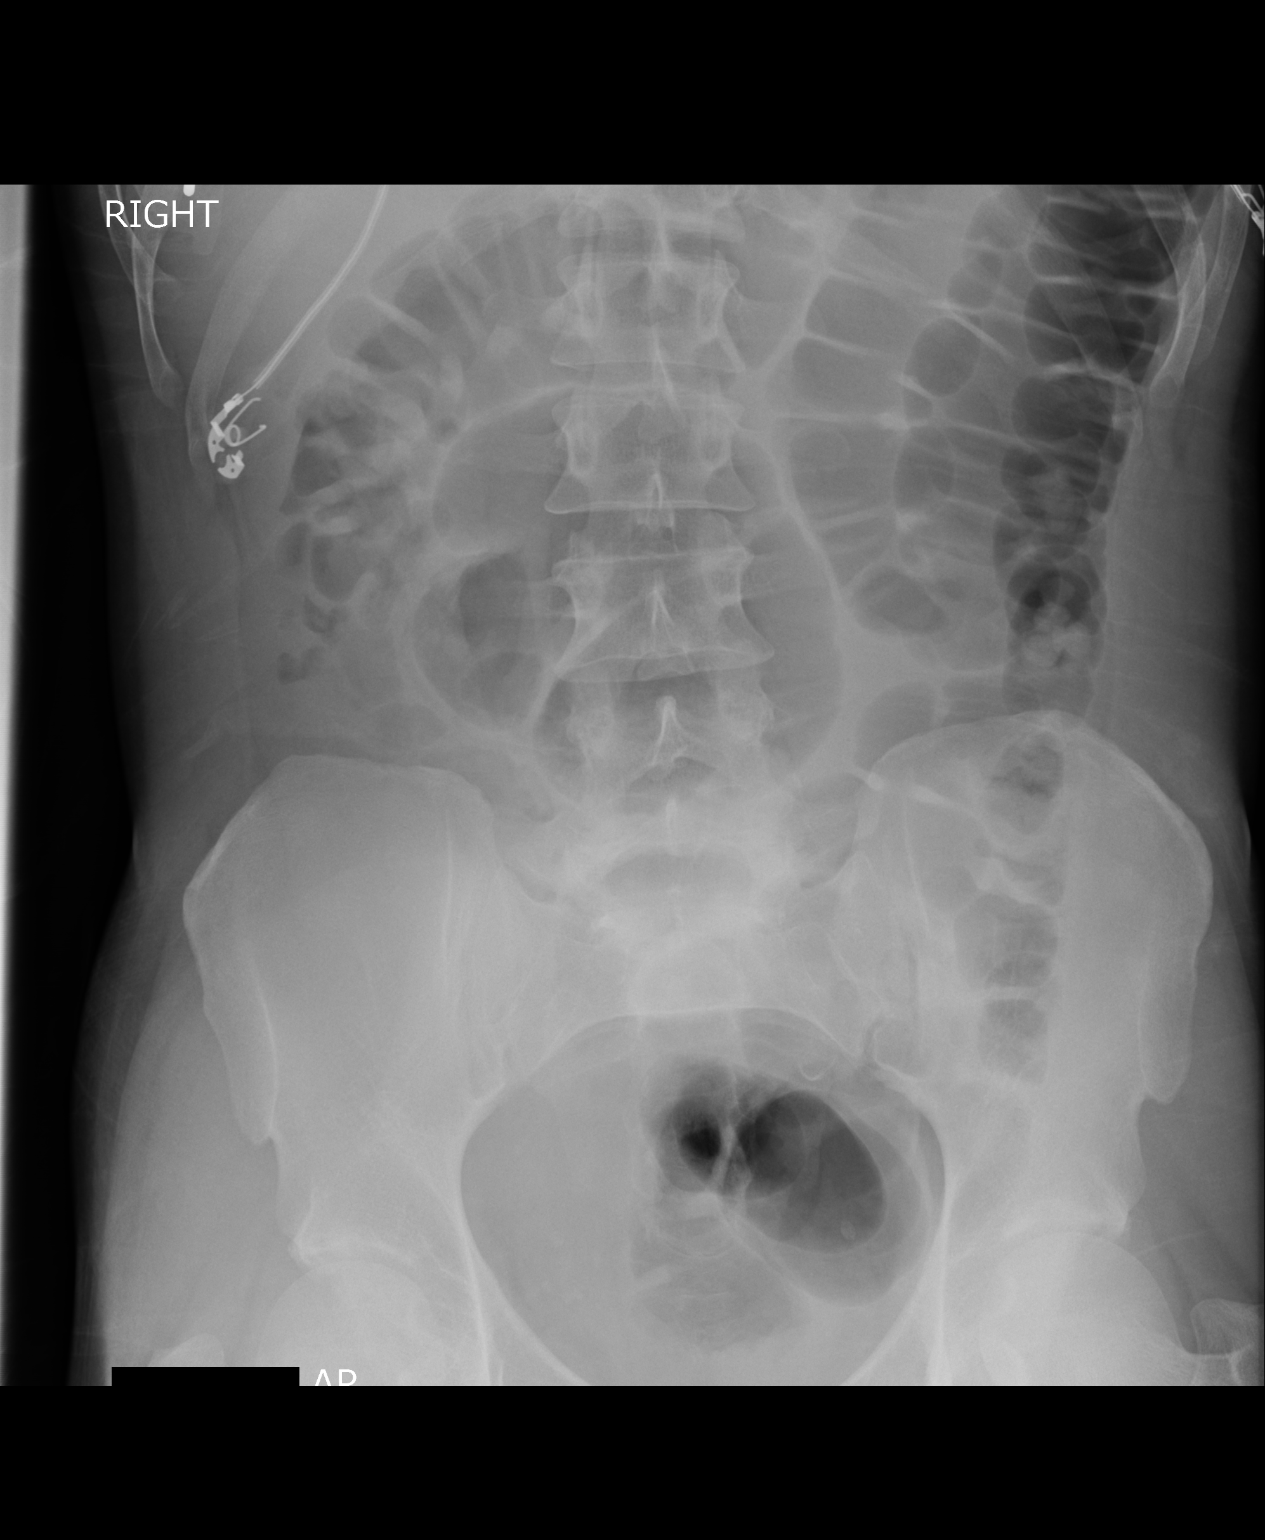

[1 of 1 positions shown; findings below may reference images not displayed]

FINDINGS: Scattered large and small bowel gas is noted. Paucity of bowel gas
is noted in the right lower quadrant and appears to be related to a
partially mobile cecum. No abnormal mass or abnormal calcifications
are noted. No gross bony abnormality is seen.
IMPRESSION: Nonspecific abdomen.

## 2016-04-07 ENCOUNTER — Ambulatory Visit: Payer: Medicare Other | Admitting: Occupational Therapy

## 2016-04-07 ENCOUNTER — Encounter: Payer: Self-pay | Admitting: Occupational Therapy

## 2016-04-07 VITALS — BP 150/97

## 2016-04-07 DIAGNOSIS — R278 Other lack of coordination: Secondary | ICD-10-CM | POA: Diagnosis not present

## 2016-04-07 DIAGNOSIS — R41842 Visuospatial deficit: Secondary | ICD-10-CM

## 2016-04-07 DIAGNOSIS — R482 Apraxia: Secondary | ICD-10-CM | POA: Diagnosis not present

## 2016-04-07 DIAGNOSIS — R29818 Other symptoms and signs involving the nervous system: Secondary | ICD-10-CM | POA: Diagnosis not present

## 2016-04-07 DIAGNOSIS — R4701 Aphasia: Secondary | ICD-10-CM | POA: Diagnosis not present

## 2016-04-07 NOTE — Therapy (Signed)
[facility] [address] [place], [place], [address] Phone: [phone]   Fax:  [phone]  Occupational Therapy Treatment  Patient Details  Name: [name] MRN: [id number] Date of Birth: May 27, 1970 Referring Provider: Dr. Lottie Rater  Encounter Date: 04/07/2016      OT End of Session - 04/07/16 1652    Visit Number 4   Number of Visits 16   Date for OT Re-Evaluation 05/13/16   Authorization Type medicare will need G code and PN every 10th visit   Authorization Time Period 60 days   Authorization - Visit Number 4   Authorization - Number of Visits 10   OT Start Time 0240   OT Stop Time 1610   OT Time Calculation (min) 39 min   Activity Tolerance Patient tolerated treatment well      Past Medical History:  Diagnosis Date  . Adenomatous colon polyp 08/2015  . Anxiety   . Aortic disease ([place])   . Aortic dissection (HCC)    a. admx 04/2014 >> L renal infarct; a/c renal failure >> b.  s/p Bioprosthetic Bentall and total arch replacement and staged endovascular repair of descending aortic aneurysm ([facility] - Dr. [name])  . CAD (coronary artery disease)    a. LHC 4/16:  oD1 60%  . Cardiomyopathy ([place])    a. non-ischemic - probably related to untreated HTN and ETOH abuse - Echo 3/13 with EF 35-40% >> b. Echo 4/16: Severe LVH, EF 55-60%, moderate AI, moderate MR, mild LAE, trivial effusion, known type B dissection with communication between true and false lumens with suprasternal images suggesting dissection plane may propagate to at least left subclavian takeoff, root above aortic valve okay    . Chronic abdominal pain   . Chronic combined systolic and diastolic congestive heart failure ([place])    a. 05/2011: Adm with pulm edema/HTN urgency, EF 35-40% with diffuse hypokinesis and moderate to severe mitral regurgitation. Cardiomyopathy likely due to uncontrolled HTN and ETOH abuse - cath deferred due to renal insufficiency  (felt due to uncontrolled HTN). bJodie Echevaria MV 06/2011: EF 37% and no ischemia or infarction. c. EF 45-50% by echo 01/2012.  [name] Chronic sinusitis   . CKD (chronic kidney disease)    a. 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  peak creatinine 3.46 during admx for aortic dissection 2/16.  sees Dr [name]  . Colon polyps 09/11/2015   Descending, sigmoid polyps  . DDD (degenerative disc disease), lumbar   . Descending thoracic aortic aneurysm ([place])   . Dissecting aneurysm of thoracic aorta ([place])   . ETOH abuse    a. Reported to have quit 05/2011.  [name] GERD (gastroesophageal reflux disease)   . Headache(784.0)    "q other day" (08/08/2013)  . Heart murmur   . Hemorrhoid thrombosis   . History of echocardiogram    Echo 1/17:  Severe LVH, EF 55-60%, no RWMA, Gr 2 DD, AVR ok, mild to mod MR, mild LAE, mild reduced RVSF, mod RAE  . History of medication noncompliance   . HYPERLIPIDEMIA   . Hypertension    a. Hx of HTN urgency secondary to noncompliance. b. urinary metanephrine and catecholeamine levels normal 2013.  c. Renal art [name] 1/16:  No evidence of renal artery stenosis noted bilaterally.  . INGUINAL HERNIA   . Pneumonia ~ 2013  . Renal insufficiency   . Tobacco abuse   . Valvular heart disease    a. Echo 05/2011: moderate to severe eccentric MR  and mild to moderate AI with prolapsing left coronary cusp. b. Echo 01/2012: mild-mod AI, mild dilitation of aortic root, mild MR.;  c. Echo 1/16: Severe LVH consistent with hypertrophic cardio myopathy, EF 50%, no RWMA, mod AI, mild MR, mild RAE, dilated Ao root (40 mm);     Past Surgical History:  Procedure Laterality Date  . ANKLE SURGERY Bilateral    Fractures bilaterally  . AORTIC VALVE SURGERY  09/2014  . FOOT FRACTURE SURGERY Bilateral 2004-2010   "got pins in both of them"  . HEMORRHOID SURGERY N/A 06/15/2015   Procedure: HEMORRHOIDECTOMY;  Surgeon: Stark Klein, MD;  Location: [facility];  Service: General;  Laterality: N/A;  . INGUINAL HERNIA REPAIR Right  ~ 1996  . LEFT HEART CATHETERIZATION WITH CORONARY ANGIOGRAM N/A 06/21/2014   Procedure: LEFT HEART CATHETERIZATION WITH CORONARY ANGIOGRAM;  Surgeon: Larey Dresser, MD;  Location: [facility] CATH LAB;  Service: Cardiovascular;  Laterality: N/A;  . TEE WITHOUT CARDIOVERSION N/A 03/12/2016   Procedure: TRANSESOPHAGEAL ECHOCARDIOGRAM (TEE);  Surgeon: Sanda Klein, MD;  [facility] ENDOSCOPY;  Service: Cardiovascular;  Laterality: N/A;    Vitals:   04/07/16 1534  BP: (!) 150/97        Subjective Assessment - 04/07/16 1538    Subjective  I have been trying to see my dr I am hoping to get my BP meds straightened out today. (Pt is aware that he needs to addresss asap)   Pertinent History see epic    Patient Stated Goals I want more ability to use my right arm   Currently in Pain? No/denies                      OT Treatments/Exercises (OP) - 04/07/16 0001      ADLs   ADL Comments checked goals - see goal section for details.  Pt has met or exceeded all goals.  Pt states he is pleased with his progress. Pt reports he has resumed cooking at home as well as home maintainence tasks and using tools. Pt finds no more than minimal difficulty working with tools on simple home mgmt tasks.      Fine Motor Coordination   Other Fine Motor Exercises Tweezer dexterity board - pt iniitally with moderate difficulty due to mild apraxia and impaired sensation however both improved with practice and repetition.                   OT Short Term Goals - 04/07/16 1651      OT SHORT TERM GOAL #1   Title Pt will be mod I with HEP - 04/15/2016   Status Achieved     OT SHORT TERM GOAL #2   Title Pt will need no more than min vc's to use vision to compensate for sensory loss of R hand   Status Achieved     OT SHORT TERM GOAL #3   Title Pt will verbalize understanding of safety precautions for sensory loss of R hand   Status Achieved     OT SHORT TERM GOAL #4   Title Pt will be  supervision for simple hot meal prep    Status Achieved     OT SHORT TERM GOAL #5   Title Pt will be supervision with shaving with electric razor   Status Achieved           OT Long Term Goals - 04/07/16 1651      OT LONG TERM GOAL #1  Title Pt will be mod I with upgraded HEP - 05/13/2016   Status Achieved     OT LONG TERM GOAL #2   Title Pt will be mod I with simple hot meal prep AE prn for safety due to loss of sensory of R hand   Status Achieved     OT LONG TERM GOAL #3   Title Pt will be mod I for shaving   Status Achieved     OT LONG TERM GOAL #4   Title Pt will decrease time on 9 hole peg by at least 5 seconds (baseline=41.01) as evidenced of improved fine motor control for functional tasks.    Status Achieved  22.85     OT LONG TERM GOAL #5   Title Pt will improve on box and blocks by at least 4 blocks (baseline=44) as evidence of improved functional use of RUE   Status Achieved  60     OT LONG TERM GOAL #6   Title Pt will use screwdriver mod I for simple home mgmt tasks as prep for return to part time work    Status Achieved               Plan - Apr 21, 2016 1652    Clinical Impression Statement Pt has progressed rapidly and has met all goals. Pt ready for d/c   Rehab Potential Good   OT Frequency 2x / week   OT Duration 8 weeks   OT Treatment/Interventions Self-care/ADL training;Ultrasound;Moist Heat;Electrical Stimulation;Fluidtherapy;DME and/or AE instruction;Neuromuscular education;Therapeutic exercise;Therapeutic activities;Patient/family education   Plan d/c from OT   Consulted and Agree with Plan of Care Patient      Patient will benefit from skilled therapeutic intervention in order to improve the following deficits and impairments:  Decreased activity tolerance, Decreased knowledge of precautions, Decreased coordination, Decreased knowledge of use of DME, Impaired UE functional use, Impaired sensation, Impaired vision/preception, Pain  Visit  Diagnosis: Other lack of coordination  Apraxia  Other symptoms and signs involving the nervous system  Visuospatial deficit      G-Codes - 04-21-2016 1653    Functional Assessment Tool Used 9 hole peg, box and blocks   Functional Limitation Carrying, moving and handling objects   Carrying, Moving and Handling Objects Current Status (C5852) At least 1 percent but less than 20 percent impaired, limited or restricted   Carrying, Moving and Handling Objects Goal Status (D7824) At least 1 percent but less than 20 percent impaired, limited or restricted   Carrying, Moving and Handling Objects Discharge Status [phone]) At least 1 percent but less than 20 percent impaired, limited or restricted      Problem List Patient Active Problem List   Diagnosis Date Noted  . Palpitations   . Cerebrovascular accident (CVA) due to embolism of left middle cerebral artery ([place])   . Malnutrition of moderate degree 02/29/2016  . Seizure ([place]) 02/28/2016  . HLD (hyperlipidemia) 08/28/2015  . Thrombosed hemorrhoids 06/14/2015  . Rectal bleeding 06/14/2015  . Rectal pain 06/14/2015  . 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[place]) 06/07/2015  . Cardiomyopathy ([place]) 03/01/2015  . Acid reflux 03/01/2015  . Essential hypertension 03/01/2015  . Cardiac chest pain 12/10/2014  . Abdominal pain 10/12/2014  . Injury of kidney 10/03/2014  . S/P aortic dissection repair 10/03/2014  . H/O aortic valve replacement 10/03/2014  . Pre-op testing 06/15/2014  . CKD (chronic kidney disease) 05/15/2014  . Hypoxia   . AKI (acute kidney injury) ([place])   . Aortic dissection, thoracoabdominal ([place])   .  Dissecting aneurysm of thoracic aorta, Stanford type B ([place]) 04/24/2014  . Aortic dissection, thoracic ([place]) 04/24/2014  . Aortic dissection ([place]) 04/23/2014  . Aneurysm of ascending aorta (HCC) 03/28/2014  . Dyspnea 03/27/2014  . Prolonged Q-T interval on ECG 03/27/2014  . ETOH abuse 09/20/2013  . Hypertensive crisis  08/08/2013  . Sinusitis, bacterial 08/08/2013  . Malignant hypertension 02/20/2013  . Hypertensive urgency 01/27/2012  . Cardiomyopathy, hypertensive ([place]) 01/26/2012  . AI (aortic insufficiency) 01/26/2012  . MR (mitral regurgitation) 01/26/2012  . Acute renal failure superimposed on stage 3 chronic kidney disease ([place]) 01/26/2012  . Systolic CHF, chronic ([place]) 06/10/2011  . Chronic bronchitis 05/23/2011  . Hypertensive emergency 05/22/2011  . Cardiomegaly - hypertensive 05/22/2011  . Tobacco abuse 05/22/2011  . Marijuana use 05/22/2011  . Abdominal pain 05/22/2011  . Hypertension, malignant 05/22/2011  . Anxiety and depression 05/22/2011  . Hyperlipidemia 08/26/2009  . Tobacco use 08/26/2009  . Headache(784.0) 08/26/2009  . Aortic valve disorder 03/11/2009  . INGUINAL HERNIA 02/18/2009  . Uncontrolled hypertension 01/16/2009  . MURMUR 01/16/2009   OCCUPATIONAL THERAPY DISCHARGE SUMMARY  Visits from Start of Care: 4  Current functional level related to goals / functional outcomes: See above   Remaining deficits: Mild sensory impairment, mild apraxia   Education / Equipment: HEP Plan: Patient agrees to discharge.  Patient goals were met. Patient is being discharged due to meeting the stated rehab goals.  ?????       Quay Burow, OTR/L 04/07/2016, 4:54 PM  [facility] [address] [place], [place], [address] Phone: [phone]   Fax:  [phone]  Name: [name] MRN: [id number] Date of Birth: August 16, 1970

## 2016-04-08 ENCOUNTER — Ambulatory Visit: Payer: Medicare Other | Admitting: Occupational Therapy

## 2016-04-09 DIAGNOSIS — G40909 Epilepsy, unspecified, not intractable, without status epilepticus: Secondary | ICD-10-CM | POA: Diagnosis not present

## 2016-04-15 ENCOUNTER — Encounter: Payer: Medicare Other | Admitting: Occupational Therapy

## 2016-04-15 ENCOUNTER — Ambulatory Visit: Payer: Medicare Other

## 2016-04-15 DIAGNOSIS — R482 Apraxia: Secondary | ICD-10-CM | POA: Diagnosis not present

## 2016-04-15 DIAGNOSIS — R41842 Visuospatial deficit: Secondary | ICD-10-CM | POA: Diagnosis not present

## 2016-04-15 DIAGNOSIS — R4701 Aphasia: Secondary | ICD-10-CM | POA: Diagnosis not present

## 2016-04-15 DIAGNOSIS — R29818 Other symptoms and signs involving the nervous system: Secondary | ICD-10-CM | POA: Diagnosis not present

## 2016-04-15 DIAGNOSIS — R278 Other lack of coordination: Secondary | ICD-10-CM | POA: Diagnosis not present

## 2016-04-15 NOTE — Patient Instructions (Signed)
  For 20 minutes twice each day (at least): Write down each word 10 times. Say each letter as you write it, then say the word ========================  Don't worry about using the microphone to text

## 2016-04-16 ENCOUNTER — Encounter: Payer: Medicare Other | Admitting: Occupational Therapy

## 2016-04-16 NOTE — Therapy (Signed)
[facility] [address] [place], [place], [address] Phone: [phone]   Fax:  [phone]  Speech Language Pathology Evaluation  Patient Details  Name: [name] MRN: [id number] Date of Birth: 1970/10/29 Referring Provider: Rosalin Hawking MD  Encounter Date: 04/15/2016      End of Session - 04/16/16 1301    Visit Number 1   Number of Visits 17   Date for SLP Re-Evaluation 06/19/16   Authorization Type medicaid secondary   SLP Start Time [phone]   SLP Stop Time  0930   SLP Time Calculation (min) 43 min   Activity Tolerance Patient tolerated treatment well      Past Medical History:  Diagnosis Date  . Adenomatous colon polyp 08/2015  . Anxiety   . Aortic disease ([place])   . Aortic dissection (HCC)    a. admx 04/2014 >> L renal infarct; a/c renal failure >> b.  s/p Bioprosthetic Bentall and total arch replacement and staged endovascular repair of descending aortic aneurysm ([facility] - Dr. [name])  . CAD (coronary artery disease)    a. LHC 4/16:  oD1 60%  . Cardiomyopathy ([place])    a. non-ischemic - probably related to untreated HTN and ETOH abuse - Echo 3/13 with EF 35-40% >> b. Echo 4/16: Severe LVH, EF 55-60%, moderate AI, moderate MR, mild LAE, trivial effusion, known type B dissection with communication between true and false lumens with suprasternal images suggesting dissection plane may propagate to at least left subclavian takeoff, root above aortic valve okay    . Chronic abdominal pain   . Chronic combined systolic and diastolic congestive heart failure ([place])    a. 05/2011: Adm with pulm edema/HTN urgency, EF 35-40% with diffuse hypokinesis and moderate to severe mitral regurgitation. Cardiomyopathy likely due to uncontrolled HTN and ETOH abuse - cath deferred due to renal insufficiency (felt due to uncontrolled HTN). bJodie Echevaria MV 06/2011: EF 37% and no ischemia or infarction. c. EF 45-50% by echo 01/2012.  [name] Chronic  sinusitis   . CKD (chronic kidney disease)    a. 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  peak creatinine 3.46 during admx for aortic dissection 2/16.  sees Dr [name]  . Colon polyps 09/11/2015   Descending, sigmoid polyps  . DDD (degenerative disc disease), lumbar   . Descending thoracic aortic aneurysm ([place])   . Dissecting aneurysm of thoracic aorta ([place])   . ETOH abuse    a. Reported to have quit 05/2011.  [name] GERD (gastroesophageal reflux disease)   . Headache(784.0)    "q other day" (08/08/2013)  . Heart murmur   . Hemorrhoid thrombosis   . History of echocardiogram    Echo 1/17:  Severe LVH, EF 55-60%, no RWMA, Gr 2 DD, AVR ok, mild to mod MR, mild LAE, mild reduced RVSF, mod RAE  . History of medication noncompliance   . HYPERLIPIDEMIA   . Hypertension    a. Hx of HTN urgency secondary to noncompliance. b. urinary metanephrine and catecholeamine levels normal 2013.  c. Renal art [name] 1/16:  No evidence of renal artery stenosis noted bilaterally.  . INGUINAL HERNIA   . Pneumonia ~ 2013  . Renal insufficiency   . Tobacco abuse   . Valvular heart disease    a. Echo 05/2011: moderate to severe eccentric MR and mild to moderate AI with prolapsing left coronary cusp. b. Echo 01/2012: mild-mod AI, mild dilitation of aortic root, mild MR.;  c. Echo 1/16: Severe LVH consistent  with hypertrophic cardio myopathy, EF 50%, no RWMA, mod AI, mild MR, mild RAE, dilated Ao root (40 mm);     Past Surgical History:  Procedure Laterality Date  . ANKLE SURGERY Bilateral    Fractures bilaterally  . AORTIC VALVE SURGERY  09/2014  . FOOT FRACTURE SURGERY Bilateral 2004-2010   "got pins in both of them"  . HEMORRHOID SURGERY N/A 06/15/2015   Procedure: HEMORRHOIDECTOMY;  Surgeon: Stark Klein, MD;  Location: [facility];  Service: General;  Laterality: N/A;  . INGUINAL HERNIA REPAIR Right ~ 1996  . LEFT HEART CATHETERIZATION WITH CORONARY ANGIOGRAM N/A 06/21/2014   Procedure: LEFT HEART CATHETERIZATION WITH CORONARY  ANGIOGRAM;  Surgeon: Larey Dresser, MD;  Location[facility] CATH LAB;  Service: Cardiovascular;  Laterality: N/A;  . TEE WITHOUT CARDIOVERSION N/A 03/12/2016   Procedure: TRANSESOPHAGEAL ECHOCARDIOGRAM (TEE);  Surgeon: Sanda Klein, MD;  Location: [facility] ENDOSCOPY;  Service: Cardiovascular;  Laterality: N/A;    There were no vitals filed for this visit.      Subjective Assessment - 04/15/16 1243    Subjective Pt reports what appears to be most like written aphasia with texting and writing.   Currently in Pain? No/denies            SLP Evaluation [facility] - 04/16/16 0001      SLP Visit Information   SLP Received On 04/15/16   Referring Provider Rosalin Hawking MD   Onset Date 02-28-16   Medical Diagnosis lt CVA     General Information   HPI Pt presented to ED with symptoms lt CVA on 02-28-16. He reports difficulty with texting and writing. Some difficulty with talking "It's like I have to think really hard about how (the words come) out."     Prior Functional Status   Cognitive/Linguistic Baseline Within functional limits   Vocation On disability  works part time as Runner, broadcasting/film/video Status Within Functional Limits for tasks assessed     Auditory Comprehension   Overall [place] within functional limits for tasks assessed     Reading Comprehension   Reading Status Within funtional limits     Expression   Primary Mode of Expression Verbal     Verbal Expression   Overall Verbal Expression Impaired   Level of Generative/Spontaneous Verbalization Conversation   Other Verbal Expression Comments Mild verbal expression deficit in conversation most like transcortical motor aphasia, as pt was able to repeat multisyllable words and sentences 100% success. Difficulty was seen in repeating increasing length words (i.e., "please pleasing pleasingly", "sit, city, citizen")     Written Expression   Dominant Hand Right   Written Expression  Exceptions to [facility]   Dictation Ability Word   Self Formulation Ability --  name/address   Overall Writen Expression Moderately-severely impaired. Pt was able to write common words to dictation with extra time, with 70% success. Pt demonstrated error awareness 100% of the time. Success increased to 77% with pt self-correction.     Oral Motor/Sensory Function   Overall Oral Motor/Sensory Function Appears within functional limits for tasks assessed     Motor Speech   Overall Motor Speech Appears within functional limits for tasks assessed   Motor Planning --                         SLP Education - 04/16/16 1300    Education provided Yes   Education Details HEP, compensatory measures for  written expression   Person(s) Educated Patient   Methods Explanation;Demonstration;Handout   Comprehension Verbalized understanding;Returned demonstration          SLP Short Term Goals - 2016/04/19 1304      SLP SHORT TERM GOAL #1   Title pt will write 4-5 word sentence responses 90% success with occasional min A    Time 4   Period Weeks   Status New     SLP SHORT TERM GOAL #2   Title pt will complete HEP for written language with independence, procedurally   Time 4   Period Weeks   Status New     SLP SHORT TERM GOAL #3   Title pt will report, subjectively, incr'd fluidity in his conversational speech compared to pre-ST   Time 4   Period Weeks   Status New          SLP Long Term Goals - 04/19/2016 1314      SLP LONG TERM GOAL #1   Title pt will write or text 2-3 sentence functional material with modified indpendence    Time 8   Period Weeks   Status New     SLP LONG TERM GOAL #2   Title pt will report incr'd QOL, due to better written language, than prior to ST course   Time 8   Period Weeks   Status New          Plan - Apr 19, 2016 1302    Clinical Impression Statement Pt presents today with expressive aphasia, specifically a mod-severe written expression  deficit and mild verbal expression deficit. Receptive language appears at least [facility] during evaluation tasks today and in conversation. Pt is consistently aware of errors and reports decr'd QOL from this. Pt would benift from skilled ST addressing expressive aphasia.   Speech Therapy Frequency 2x / week   Duration --  8 weeks   Treatment/Interventions Language facilitation;Compensatory techniques;Internal/external aids;SLP instruction and feedback;Multimodal communcation approach;Patient/family education;Functional tasks   Potential to Achieve Goals Good      Patient will benefit from skilled therapeutic intervention in order to improve the following deficits and impairments:   Aphasia      G-Codes - Apr 19, 2016 1316    Functional Assessment Tool Used NOMS -6   Functional Limitations Spoken language expressive   Spoken Language Expression Current Status [phone]) At least 1 percent but less than 20 percent impaired, limited or restricted   Spoken Language Expression Goal Status (Z9935) At least 1 percent but less than 20 percent impaired, limited or restricted      Problem List Patient Active Problem List   Diagnosis Date Noted  . Palpitations   . Cerebrovascular accident (CVA) due to embolism of left middle cerebral artery ([place])   . Malnutrition of moderate degree 02/29/2016  . Seizure ([place]) 02/28/2016  . HLD (hyperlipidemia) 08/28/2015  . Thrombosed hemorrhoids 06/14/2015  . Rectal bleeding 06/14/2015  . Rectal pain 06/14/2015  . 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[place]) 06/07/2015  . Cardiomyopathy ([place]) 03/01/2015  . Acid reflux 03/01/2015  . Essential hypertension 03/01/2015  . Cardiac chest pain 12/10/2014  . Abdominal pain 10/12/2014  . Injury of kidney 10/03/2014  . S/P aortic dissection repair 10/03/2014  . H/O aortic valve replacement 10/03/2014  . Pre-op testing 06/15/2014  . CKD (chronic kidney disease) 05/15/2014  . Hypoxia   . AKI (acute kidney injury) ([place])   .  Aortic dissection, thoracoabdominal ([place])   . 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  Aortic dissection, thoracic ([place]) 04/24/2014  . Aortic dissection ([place]) 04/23/2014  . Aneurysm of ascending aorta (HCC) 03/28/2014  . Dyspnea 03/27/2014  . Prolonged Q-T interval on ECG 03/27/2014  . ETOH abuse 09/20/2013  . Hypertensive crisis 08/08/2013  . Sinusitis, bacterial 08/08/2013  . Malignant hypertension 02/20/2013  . Hypertensive urgency 01/27/2012  . Cardiomyopathy, hypertensive ([place]) 01/26/2012  . AI (aortic insufficiency) 01/26/2012  . MR (mitral regurgitation) 01/26/2012  . Acute renal failure superimposed on stage 3 chronic kidney disease ([place]) 01/26/2012  . Systolic CHF, chronic ([place]) 06/10/2011  . Chronic bronchitis 05/23/2011  . Hypertensive emergency 05/22/2011  . Cardiomegaly - hypertensive 05/22/2011  . Tobacco abuse 05/22/2011  . Marijuana use 05/22/2011  . Abdominal pain 05/22/2011  . Hypertension, malignant 05/22/2011  . Anxiety and depression 05/22/2011  . Hyperlipidemia 08/26/2009  . Tobacco use 08/26/2009  . Headache(784.0) 08/26/2009  . Aortic valve disorder 03/11/2009  . INGUINAL HERNIA 02/18/2009  . Uncontrolled hypertension 01/16/2009  . MURMUR 01/16/2009    [place] ,[place], CCC-SLP  04/16/2016, 1:18 PM  [facility] [address] [place], [place], [address] Phone: [phone]   Fax:  [phone]  Name: [name] MRN: [id number] Date of Birth: [birth year]

## 2016-04-20 ENCOUNTER — Encounter: Payer: Medicare Other | Admitting: Occupational Therapy

## 2016-04-20 ENCOUNTER — Ambulatory Visit: Payer: Medicare Other | Admitting: Speech Pathology

## 2016-04-23 ENCOUNTER — Ambulatory Visit: Payer: Medicare Other | Attending: Internal Medicine | Admitting: Speech Pathology

## 2016-04-23 ENCOUNTER — Encounter: Payer: Medicare Other | Admitting: Occupational Therapy

## 2016-04-23 DIAGNOSIS — R482 Apraxia: Secondary | ICD-10-CM | POA: Diagnosis not present

## 2016-04-23 DIAGNOSIS — R4701 Aphasia: Secondary | ICD-10-CM | POA: Insufficient documentation

## 2016-04-23 NOTE — Therapy (Signed)
[facility] [address] [place], [place], [address] Phone: [phone]   Fax:  [phone]  Speech Language Pathology Treatment  Patient Details  Name: [name] MRN: [id number] Date of Birth: [birth year] Referring Provider: Rosalin Hawking MD  Encounter Date: 04/23/2016      End of Session - 04/23/16 1804    Visit Number 2   Number of Visits 17   Date for SLP Re-Evaluation 06/19/16   Authorization Type medicaid secondary   SLP Start Time 1532   SLP Stop Time  1615   SLP Time Calculation (min) 43 min   Activity Tolerance Patient tolerated treatment well      Past Medical History:  Diagnosis Date  . Adenomatous colon polyp 08/2015  . Anxiety   . Aortic disease ([place])   . Aortic dissection ([facility])    a. admx 04/2014 >> L renal infarct; a/c renal failure >> b.  s/p Bioprosthetic [place] and total arch replacement and staged endovascular repair of descending aortic aneurysm ([facility] - Dr. [name])  . CAD (coronary artery disease)    a. LHC 4/16:  oD1 60%  . Cardiomyopathy ([place])    a. non-ischemic - probably related to untreated HTN and ETOH abuse - Echo 3/13 with EF 35-40% >> b. Echo 4/16: Severe LVH, EF 55-60%, moderate AI, moderate MR, mild LAE, trivial effusion, known type B dissection with communication between true and false lumens with suprasternal images suggesting dissection plane may propagate to at least left subclavian takeoff, root above aortic valve okay    . Chronic abdominal pain   . Chronic combined systolic and diastolic congestive heart failure ([place])    a. 05/2011: Adm with pulm edema/HTN urgency, EF 35-40% with diffuse hypokinesis and moderate to severe mitral regurgitation. Cardiomyopathy likely due to uncontrolled HTN and ETOH abuse - cath deferred due to renal insufficiency (felt due to uncontrolled HTN). bJodie Echevaria MV 06/2011: EF 37% and no ischemia or infarction. c. EF 45-50% by echo 01/2012.  [name] Chronic  sinusitis   . CKD (chronic kidney disease)    a. 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  peak creatinine 3.46 during admx for aortic dissection 2/16.  sees Dr [name]  . Colon polyps 09/11/2015   Descending, sigmoid polyps  . DDD (degenerative disc disease), lumbar   . Descending thoracic aortic aneurysm ([place])   . Dissecting aneurysm of thoracic aorta ([place])   . ETOH abuse    a. Reported to have quit 05/2011.  [name] GERD (gastroesophageal reflux disease)   . Headache(784.0)    "q other day" (08/08/2013)  . Heart murmur   . Hemorrhoid thrombosis   . History of echocardiogram    Echo 1/17:  Severe LVH, EF 55-60%, no RWMA, Gr 2 DD, AVR ok, mild to mod MR, mild LAE, mild reduced RVSF, mod RAE  . History of medication noncompliance   . HYPERLIPIDEMIA   . Hypertension    a. Hx of HTN urgency secondary to noncompliance. b. urinary metanephrine and catecholeamine levels normal 2013.  c. Renal art [name] 1/16:  No evidence of renal artery stenosis noted bilaterally.  . INGUINAL HERNIA   . Pneumonia ~ 2013  . Renal insufficiency   . Tobacco abuse   . Valvular heart disease    a. Echo 05/2011: moderate to severe eccentric MR and mild to moderate AI with prolapsing left coronary cusp. b. Echo 01/2012: mild-mod AI, mild dilitation of aortic root, mild MR.;  c. Echo 1/16: Severe LVH consistent  with hypertrophic cardio myopathy, EF 50%, no RWMA, mod AI, mild MR, mild RAE, dilated Ao root (40 mm);     Past Surgical History:  Procedure Laterality Date  . ANKLE SURGERY Bilateral    Fractures bilaterally  . AORTIC VALVE SURGERY  09/2014  . FOOT FRACTURE SURGERY Bilateral 2004-2010   "got pins in both of them"  . HEMORRHOID SURGERY N/A 06/15/2015   Procedure: HEMORRHOIDECTOMY;  Surgeon: Stark Klein, MD;  Location: [facility];  Service: General;  Laterality: N/A;  . INGUINAL HERNIA REPAIR Right ~ 1996  . LEFT HEART CATHETERIZATION WITH CORONARY ANGIOGRAM N/A 06/21/2014   Procedure: LEFT HEART CATHETERIZATION WITH CORONARY  ANGIOGRAM;  Surgeon: Larey Dresser, MD;  Location: [facility] CATH LAB;  Service: Cardiovascular;  Laterality: N/A;  . TEE WITHOUT CARDIOVERSION N/A 03/12/2016   Procedure: TRANSESOPHAGEAL ECHOCARDIOGRAM (TEE);  Surgeon: Sanda Klein, MD;  Location: [facility] ENDOSCOPY;  Service: Cardiovascular;  Laterality: N/A;    There were no vitals filed for this visit.      Subjective Assessment - 04/23/16 1621    Subjective "I've been trying to help my daughter with her homework but I can't like I used to."   Currently in Pain? No/denies               ADULT SLP TREATMENT - 04/23/16 0001      General Information   Behavior/Cognition Alert;Cooperative;Pleasant mood     Treatment Provided   Treatment provided Cognitive-Linquistic     Pain Assessment   Pain Assessment No/denies pain     Cognitive-Linquistic Treatment   Treatment focused on Aphasia;Patient/family/caregiver education   Skilled Treatment Engaged pt in written category naming task to target spelling/writing. Patient named 3 items in 5 categories independently. Patient often began to write the word, but required cues and encouragement to continue. He stated, "See, this is what happens to me; I don't know why I can't spell it." Provided education regarding impact of aphasia on writing, neuroplasticity, and purpose of therapeutic tasks to challenge and improve areas of difficulty. Patient verbalized understanding. He benefitted from verbal cues and encouragement to continue righting, even if he was unsure of accuracy. Following this instruction, patient was able to write 1-3 word level responses, with moderate verbal cues to correct spelling errors. Instructed patient in phonomotor treatment task for aphasia to promote phonemic awareness. Introduced 3 paired consonant sounds (p/b, t/d, f/v)with visual cues and modeling. Following instruction and clinician modeling, patient is able to produce and describe articulatory placement, manner of  production and voicing with 80% accuracy. Plan to progress to vowels and begin non word phonemic awareness tasks next session.      Assessment / Recommendations / Plan   Plan Continue with current plan of care          SLP Education - 04/23/16 1804    Education provided Yes   Education Details neuroplasticity, HEP and compensatory measures for written expression   Person(s) Educated Patient   Methods Explanation;Demonstration;Handout   Comprehension Verbalized understanding;Returned demonstration          SLP Short Term Goals - 04/23/16 1812      SLP SHORT TERM GOAL #1   Title pt will write 4-5 word sentence responses 90% success with occasional min A    Time 4   Period Weeks   Status On-going     SLP SHORT TERM GOAL #2   Title pt will complete HEP for written language with independence, procedurally   Baseline 2.8  Time 4   Period Weeks   Status On-going     SLP SHORT TERM GOAL #3   Title pt will report, subjectively, incr'd fluidity in his conversational speech compared to pre-ST   Time 4   Period Weeks   Status On-going          SLP Long Term Goals - 04/23/16 1813      SLP LONG TERM GOAL #1   Title pt will write or text 2-3 sentence functional material with modified indpendence    Time 8   Period Weeks   Status On-going     SLP LONG TERM GOAL #2   Title pt will report incr'd QOL, due to better written language, than prior to ST course   Time 8   Period Weeks   Status On-going          Plan - 04/23/16 1805    Clinical Impression Statement Patient presents with expressive aphasia, moderate-severe written and mild verbal expression deficits. Patient with initially low frustration tolerance for errors. When SLP provided education, encouragement and instruction in strategies to improve written communication (verbalization and writing words first before checking spelling), frustration tolerance improved. Patient is receptive and eager to use strategies  for improvement. He was very stimulable with phonomotor treatment tasks; anticipate these techniques may help to improve phonemic awareness. Pt will benefit from skilled ST to address expressive aphasia, improve verbal and written communication and QOL.   Speech Therapy Frequency 2x / week   Duration Other (comment)   Treatment/Interventions Language facilitation;Compensatory techniques;Internal/external aids;SLP instruction and feedback;Multimodal communcation approach;Patient/family education;Functional tasks   Potential to Achieve Goals Good   SLP Home Exercise Plan writing exercise   Consulted and Agree with Plan of Care Patient      Patient will benefit from skilled therapeutic intervention in order to improve the following deficits and impairments:   Aphasia    Problem List Patient Active Problem List   Diagnosis Date Noted  . Palpitations   . Cerebrovascular accident (CVA) due to embolism of left middle cerebral artery ([place])   . Malnutrition of moderate degree 02/29/2016  . Seizure ([place]) 02/28/2016  . HLD (hyperlipidemia) 08/28/2015  . Thrombosed hemorrhoids 06/14/2015  . Rectal bleeding 06/14/2015  . Rectal pain 06/14/2015  . 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[place]) 06/07/2015  . Cardiomyopathy ([place]) 03/01/2015  . Acid reflux 03/01/2015  . Essential hypertension 03/01/2015  . Cardiac chest pain 12/10/2014  . Abdominal pain 10/12/2014  . Injury of kidney 10/03/2014  . S/P aortic dissection repair 10/03/2014  . H/O aortic valve replacement 10/03/2014  . Pre-op testing 06/15/2014  . CKD (chronic kidney disease) 05/15/2014  . Hypoxia   . AKI (acute kidney injury) ([place])   . Aortic dissection, thoracoabdominal ([place])   . Dissecting aneurysm of thoracic aorta, [place] type B ([place]) 04/24/2014  . Aortic dissection, thoracic ([place]) 04/24/2014  . Aortic dissection ([place]) 04/23/2014  . Aneurysm of ascending aorta (HCC) 03/28/2014  . Dyspnea 03/27/2014  . Prolonged Q-T  interval on ECG 03/27/2014  . ETOH abuse 09/20/2013  . Hypertensive crisis 08/08/2013  . Sinusitis, bacterial 08/08/2013  . Malignant hypertension 02/20/2013  . Hypertensive urgency 01/27/2012  . Cardiomyopathy, hypertensive ([place]) 01/26/2012  . AI (aortic insufficiency) 01/26/2012  . MR (mitral regurgitation) 01/26/2012  . Acute renal failure superimposed on stage 3 chronic kidney disease ([place]) 01/26/2012  . Systolic CHF, chronic ([place]) 06/10/2011  . Chronic bronchitis 05/23/2011  . Hypertensive emergency 05/22/2011  . Cardiomegaly - hypertensive 05/22/2011  .  Tobacco abuse 05/22/2011  . Marijuana use 05/22/2011  . Abdominal pain 05/22/2011  . Hypertension, malignant 05/22/2011  . Anxiety and depression 05/22/2011  . Hyperlipidemia 08/26/2009  . Tobacco use 08/26/2009  . Headache(784.0) 08/26/2009  . Aortic valve disorder 03/11/2009  . INGUINAL HERNIA 02/18/2009  . Uncontrolled hypertension 01/16/2009  . MURMUR 01/16/2009   Deneise Lever, MS CF-SLP Speech-Language Pathologist  [facility] Altes 04/23/2016, [facility] [address] [place], [place], [address] Phone: [phone]   Fax:  [phone]   Name: ISIAC BREIGHNER MRN: [id number] Date of Birth: [birth year]

## 2016-04-27 ENCOUNTER — Encounter: Payer: Medicare Other | Admitting: Occupational Therapy

## 2016-04-27 ENCOUNTER — Telehealth: Payer: Self-pay

## 2016-04-27 ENCOUNTER — Ambulatory Visit: Payer: Medicare Other | Admitting: Speech Pathology

## 2016-04-27 NOTE — Telephone Encounter (Signed)
Rn call patient about his cardiac monitor. Rn stated the cardiac monitor was negative for any atrial fibrillation. Continue treatment plan. Pt verbalized understanding.

## 2016-04-27 NOTE — Telephone Encounter (Signed)
-----   Message from Rosalin Hawking, MD sent at 04/24/2016  2:44 PM EST ----- Could you please let the patient know that the cardiac event monitoring test done recently was negative for atrial fibrillation. Please continue current treatment. Thanks.  Rosalin Hawking, MD PhD Stroke Neurology 04/24/2016 2:44 PM

## 2016-04-28 ENCOUNTER — Encounter: Payer: Medicare Other | Admitting: Occupational Therapy

## 2016-04-30 ENCOUNTER — Ambulatory Visit: Payer: Medicare Other | Admitting: Speech Pathology

## 2016-05-01 ENCOUNTER — Ambulatory Visit (INDEPENDENT_AMBULATORY_CARE_PROVIDER_SITE_OTHER): Payer: Medicare Other | Admitting: Family

## 2016-05-01 ENCOUNTER — Encounter: Payer: Self-pay | Admitting: Family

## 2016-05-01 VITALS — BP 142/88 | HR 67 | Temp 97.9°F | Resp 16 | Ht 69.0 in | Wt 159.0 lb

## 2016-05-01 DIAGNOSIS — I10 Essential (primary) hypertension: Secondary | ICD-10-CM | POA: Diagnosis not present

## 2016-05-01 DIAGNOSIS — E785 Hyperlipidemia, unspecified: Secondary | ICD-10-CM | POA: Diagnosis not present

## 2016-05-01 DIAGNOSIS — I63412 Cerebral infarction due to embolism of left middle cerebral artery: Secondary | ICD-10-CM

## 2016-05-01 DIAGNOSIS — I639 Cerebral infarction, unspecified: Secondary | ICD-10-CM | POA: Diagnosis not present

## 2016-05-01 MED ORDER — PANTOPRAZOLE SODIUM 40 MG PO TBEC: 40.0000 mg | DELAYED_RELEASE_TABLET | Freq: Every day | ORAL | 0 refills | Status: DC

## 2016-05-01 MED ORDER — SILDENAFIL CITRATE 20 MG PO TABS: 20.0000 mg | ORAL_TABLET | Freq: Every day | ORAL | 1 refills | Status: DC | PRN

## 2016-05-01 MED ORDER — HYDRALAZINE HCL 50 MG PO TABS: 50.0000 mg | ORAL_TABLET | Freq: Two times a day (BID) | ORAL | 0 refills | Status: DC

## 2016-05-01 NOTE — Assessment & Plan Note (Signed)
LDL goal less than 70 and currently maintained on atorvastatin with no adverse side effects. Obtain lipid profile. Continue current dosage of atorvastatin pending lipid profile results.

## 2016-05-01 NOTE — Patient Instructions (Addendum)
Thank you for choosing [facility].  SUMMARY AND INSTRUCTIONS:  Please continue to monitor your blood pressure at home.  Follow up with Dr. Erlinda Hong as scheduled.  Medication:  Please continue to take your medications as prescribed.  Your prescription(s) have been submitted to your pharmacy or been printed and provided for you. Please take as directed and contact our office if you believe you are having problem(s) with the medication(s) or have any questions.  Labs:  Please stop by the lab on the lower level of the building for your blood work. Your results will be released to [place] (or called to you) after review, usually within 72 hours after test completion. If any changes need to be made, you will be notified at that same time.  1.) The lab is open from 7:30am to 5:30 pm Monday-Friday 2.) No appointment is necessary 3.) Fasting (if needed) is 6-8 hours after food and drink; black coffee and water are okay   Follow up:  If your symptoms worsen or fail to improve, please contact our office for further instruction, or in case of emergency go directly to the emergency room at the closest medical facility.     Hypertension Hypertension, commonly called high blood pressure, is when the force of blood pumping through your arteries is too strong. Your arteries are the blood vessels that carry blood from your heart throughout your body. A blood pressure reading consists of a higher number over a lower number, such as 110/72. The higher number (systolic) is the pressure inside your arteries when your heart pumps. The lower number (diastolic) is the pressure inside your arteries when your heart relaxes. Ideally you want your blood pressure below 120/80. Hypertension forces your heart to work harder to pump blood. Your arteries may become narrow or stiff. Having untreated or uncontrolled hypertension can cause heart attack, stroke, kidney disease, and other problems. What increases the  risk? Some risk factors for high blood pressure are controllable. Others are not. Risk factors you cannot control include:  Race. You may be at higher risk if you are African American.  Age. Risk increases with age.  Gender. Men are at higher risk than women before age 78 years. After age 39, women are at higher risk than men. Risk factors you can control include:  Not getting enough exercise or physical activity.  Being overweight.  Getting too much fat, sugar, calories, or salt in your diet.  Drinking too much alcohol. What are the signs or symptoms? Hypertension does not usually cause signs or symptoms. Extremely high blood pressure (hypertensive crisis) may cause headache, anxiety, shortness of breath, and nosebleed. How is this diagnosed? To check if you have hypertension, your health care provider will measure your blood pressure while you are seated, with your arm held at the level of your heart. It should be measured at least twice using the same arm. Certain conditions can cause a difference in blood pressure between your right and left arms. A blood pressure reading that is higher than normal on one occasion does not mean that you need treatment. If it is not clear whether you have high blood pressure, you may be asked to return on a different day to have your blood pressure checked again. Or, you may be asked to monitor your blood pressure at home for 1 or more weeks. How is this treated? Treating high blood pressure includes making lifestyle changes and possibly taking medicine. Living a healthy lifestyle can help lower high blood  pressure. You may need to change some of your habits. Lifestyle changes may include:  Following the DASH diet. This diet is high in fruits, vegetables, and whole grains. It is low in salt, red meat, and added sugars.  Keep your sodium intake below 2,300 mg per day.  Getting at least 30-45 minutes of aerobic exercise at least 4 times per  week.  Losing weight if necessary.  Not smoking.  Limiting alcoholic beverages.  Learning ways to reduce stress. Your health care provider may prescribe medicine if lifestyle changes are not enough to get your blood pressure under control, and if one of the following is true:  You are 81-56 years of age and your systolic blood pressure is above 140.  You are 36 years of age or older, and your systolic blood pressure is above 150.  Your diastolic blood pressure is above 90.  You have diabetes, and your systolic blood pressure is over 916 or your diastolic blood pressure is over 90.  You have kidney disease and your blood pressure is above 140/90.  You have heart disease and your blood pressure is above 140/90. Your personal target blood pressure may vary depending on your medical conditions, your age, and other factors. Follow these instructions at home:  Have your blood pressure rechecked as directed by your health care provider.  Take medicines only as directed by your health care provider. Follow the directions carefully. Blood pressure medicines must be taken as prescribed. The medicine does not work as well when you skip doses. Skipping doses also puts you at risk for problems.  Do not smoke.  Monitor your blood pressure at home as directed by your health care provider. Contact a health care provider if:  You think you are having a reaction to medicines taken.  You have recurrent headaches or feel dizzy.  You have swelling in your ankles.  You have trouble with your vision. Get help right away if:  You develop a severe headache or confusion.  You have unusual weakness, numbness, or feel faint.  You have severe chest or abdominal pain.  You vomit repeatedly.  You have trouble breathing. This information is not intended to replace advice given to you by your health care provider. Make sure you discuss any questions you have with your health care  provider. Document Released: 03/02/2005 Document Revised: 08/08/2015 Document Reviewed: 12/23/2012 Elsevier Interactive Patient Education  2017 [place] DASH stands for "Dietary Approaches to Stop Hypertension." The DASH eating plan is a healthy eating plan that has been shown to reduce high blood pressure (hypertension). Additional health benefits may include reducing the risk of type 2 diabetes mellitus, heart disease, and stroke. The DASH eating plan may also help with weight loss. What do I need to know about the DASH eating plan? For the DASH eating plan, you will follow these general guidelines:  Choose foods with less than 150 milligrams of sodium per serving (as listed on the food label).  Use salt-free seasonings or herbs instead of table salt or sea salt.  Check with your health care provider or pharmacist before using salt substitutes.  Eat lower-sodium products. These are often labeled as "low-sodium" or "no salt added."  Eat fresh foods. Avoid eating a lot of canned foods.  Eat more vegetables, fruits, and low-fat dairy products.  Choose whole grains. Look for the word "whole" as the first word in the ingredient list.  Choose fish and skinless chicken or [place] more  often than red meat. Limit fish, poultry, and meat to 6 oz (170 g) each day.  Limit sweets, desserts, sugars, and sugary drinks.  Choose heart-healthy fats.  Eat more home-cooked food and less restaurant, buffet, and fast food.  Limit fried foods.  Do not fry foods. Cook foods using methods such as baking, boiling, grilling, and broiling instead.  When eating at a restaurant, ask that your food be prepared with less salt, or no salt if possible. What foods can I eat? Seek help from a dietitian for individual calorie needs. Grains  Whole grain or whole wheat bread. Brown rice. Whole grain or whole wheat pasta. Quinoa, bulgur, and whole grain cereals. Low-sodium cereals. Corn or  whole wheat flour tortillas. Whole grain cornbread. Whole grain crackers. Low-sodium crackers. Vegetables  Fresh or frozen vegetables (raw, steamed, roasted, or grilled). Low-sodium or reduced-sodium tomato and vegetable juices. Low-sodium or reduced-sodium tomato sauce and paste. Low-sodium or reduced-sodium canned vegetables. Fruits  All fresh, canned (in natural juice), or frozen fruits. Meat and Other Protein Products  Ground beef (85% or leaner), grass-fed beef, or beef trimmed of fat. Skinless chicken or [place]. Ground chicken or [place]. Pork trimmed of fat. All fish and seafood. Eggs. Dried beans, peas, or lentils. Unsalted nuts and seeds. Unsalted canned beans. Dairy  Low-fat dairy products, such as skim or 1% milk, 2% or reduced-fat cheeses, low-fat ricotta or cottage cheese, or plain low-fat yogurt. Low-sodium or reduced-sodium cheeses. Fats and Oils  Tub margarines without trans fats. Light or reduced-fat mayonnaise and salad dressings (reduced sodium). Avocado. Safflower, olive, or canola oils. Natural peanut or almond butter. Other  Unsalted popcorn and pretzels. The items listed above may not be a complete list of recommended foods or beverages. Contact your dietitian for more options.  What foods are not recommended? Grains  White bread. White pasta. White rice. Refined cornbread. Bagels and croissants. Crackers that contain trans fat. Vegetables  Creamed or fried vegetables. Vegetables in a cheese sauce. Regular canned vegetables. Regular canned tomato sauce and paste. Regular tomato and vegetable juices. Fruits  Canned fruit in light or heavy syrup. Fruit juice. Meat and Other Protein Products  Fatty cuts of meat. Ribs, chicken wings, bacon, sausage, bologna, salami, chitterlings, fatback, hot dogs, bratwurst, and packaged luncheon meats. Salted nuts and seeds. Canned beans with salt. Dairy  Whole or 2% milk, cream, half-and-half, and cream cheese. Whole-fat or sweetened  yogurt. Full-fat cheeses or blue cheese. Nondairy creamers and whipped toppings. Processed cheese, cheese spreads, or cheese curds. Condiments  Onion and garlic salt, seasoned salt, table salt, and sea salt. Canned and packaged gravies. Worcestershire sauce. Tartar sauce. Barbecue sauce. Teriyaki sauce. Soy sauce, including reduced sodium. Steak sauce. Fish sauce. Oyster sauce. Cocktail sauce. Horseradish. Ketchup and mustard. Meat flavorings and tenderizers. Bouillon cubes. Hot sauce. Tabasco sauce. Marinades. Taco seasonings. Relishes. Fats and Oils  Butter, stick margarine, lard, shortening, ghee, and bacon fat. Coconut, palm kernel, or palm oils. Regular salad dressings. Other  Pickles and olives. Salted popcorn and pretzels. The items listed above may not be a complete list of foods and beverages to avoid. Contact your dietitian for more information.  Where can I find more information? National Heart, Lung, and Blood Institute: travelstabloid.com This information is not intended to replace advice given to you by your health care provider. Make sure you discuss any questions you have with your health care provider. Document Released: 02/19/2011 Document Revised: 08/08/2015 Document Reviewed: 01/04/2013 Elsevier Interactive Patient Education  2017  Elsevier Inc.   Smoking Hazards Smoking cigarettes is extremely bad for your health. Tobacco smoke has over 200 known poisons in it. It contains the poisonous gases nitrogen oxide and carbon monoxide. There are over 60 chemicals in tobacco smoke that cause cancer. Some of the chemicals found in cigarette smoke include:   Cyanide.   Benzene.   Formaldehyde.   Methanol (wood alcohol).   Acetylene (fuel used in welding torches).   Ammonia.  Even smoking lightly shortens your life expectancy by several years. You can greatly reduce the risk of medical problems for you and your family by stopping now.  Smoking is the most preventable cause of death and disease in our society. Within days of quitting smoking, your circulation improves, you decrease the risk of having a heart attack, and your lung capacity improves. There may be some increased phlegm in the first few days after quitting, and it may take months for your lungs to clear up completely. Quitting for 10 years reduces your risk of developing lung cancer to almost that of a nonsmoker.  WHAT ARE THE RISKS OF SMOKING? Cigarette smokers have an increased risk of many serious medical problems, including:  Lung cancer.   Lung disease (such as pneumonia, bronchitis, and emphysema).   Heart attack and chest pain due to the heart not getting enough oxygen (angina).   Heart disease and peripheral blood vessel disease.   Hypertension.   Stroke.   Oral cancer (cancer of the lip, mouth, or voice box).   Bladder cancer.   Pancreatic cancer.   Cervical cancer.   Pregnancy complications, including premature birth.   Stillbirths and smaller newborn babies, birth defects, and genetic damage to sperm.   Early menopause.   Lower estrogen level for women.   Infertility.   Facial wrinkles.   Blindness.   Increased risk of broken bones (fractures).   Senile dementia.   Stomach ulcers and internal bleeding.   Delayed wound healing and increased risk of complications during surgery. Because of secondhand smoke exposure, children of smokers have an increased risk of the following:   Sudden infant death syndrome (SIDS).   Respiratory infections.   Lung cancer.   Heart disease.   Ear infections.  WHY IS SMOKING ADDICTIVE? Nicotine is the chemical agent in tobacco that is capable of causing addiction or dependence. When you smoke and inhale, nicotine is absorbed rapidly into the bloodstream through your lungs. Both inhaled and noninhaled nicotine may be addictive.  WHAT ARE THE BENEFITS OF QUITTING?   There are many health benefits to quitting smoking. Some are:   The likelihood of developing cancer and heart disease decreases. Health improvements are seen almost immediately.   Blood pressure, pulse rate, and breathing patterns start returning to normal soon after quitting.   People who quit may see an improvement in their overall quality of life.  HOW DO YOU QUIT SMOKING? Smoking is an addiction with both physical and psychological effects, and longtime habits can be hard to change. Your health care provider can recommend:  Programs and community resources, which may include group support, education, or therapy.  Replacement products, such as patches, gum, and nasal sprays. Use these products only as directed. Do not replace cigarette smoking with electronic cigarettes (commonly called e-cigarettes). The safety of e-cigarettes is unknown, and some may contain harmful chemicals. FOR MORE INFORMATION  American Lung Association: www.lung.org  American Cancer Society: www.cancer.org   This information is not intended to replace advice given to you  by your health care provider. Make sure you discuss any questions you have with your health care provider.   Document Released: 04/09/2004 Document Revised: 12/21/2012 Document Reviewed: 08/22/2012 Elsevier Interactive Patient Education 2016 [employer].  Steps to Quit Smoking  Smoking tobacco can be harmful to your health and can affect almost every organ in your body. Smoking puts you, and those around you, at risk for developing many serious chronic diseases. Quitting smoking is difficult, but it is one of the best things that you can do for your health. It is never too late to quit. WHAT ARE THE BENEFITS OF QUITTING SMOKING? When you quit smoking, you lower your risk of developing serious diseases and conditions, such as:  Lung cancer or lung disease, such as COPD.  Heart disease.  Stroke.  Heart  attack.  Infertility.  Osteoporosis and bone fractures. Additionally, symptoms such as coughing, wheezing, and shortness of breath may get better when you quit. You may also find that you get sick less often because your body is stronger at fighting off colds and infections. If you are pregnant, quitting smoking can help to reduce your chances of having a baby of low birth weight. HOW DO I GET READY TO QUIT? When you decide to quit smoking, create a plan to make sure that you are successful. Before you quit:  Pick a date to quit. Set a date within the next two weeks to give you time to prepare.  Write down the reasons why you are quitting. Keep this list in places where you will see it often, such as on your bathroom mirror or in your car or wallet.  Identify the people, places, things, and activities that make you want to smoke (triggers) and avoid them. Make sure to take these actions:  Throw away all cigarettes at home, at work, and in your car.  Throw away smoking accessories, such as Scientist, research (medical).  Clean your car and make sure to empty the ashtray.  Clean your home, including curtains and carpets.  Tell your family, friends, and coworkers that you are quitting. Support from your loved ones can make quitting easier.  Talk with your health care provider about your options for quitting smoking.  Find out what treatment options are covered by your health insurance. WHAT STRATEGIES CAN I USE TO QUIT SMOKING?  Talk with your healthcare provider about different strategies to quit smoking. Some strategies include:  Quitting smoking altogether instead of gradually lessening how much you smoke over a period of time. Research shows that quitting "cold [place]" is more successful than gradually quitting.  Attending in-person counseling to help you build problem-solving skills. You are more likely to have success in quitting if you attend several counseling sessions. Even short  sessions of 10 minutes can be effective.  Finding resources and support systems that can help you to quit smoking and remain smoke-free after you quit. These resources are most helpful when you use them often. They can include:  Online chats with a Social worker.  Telephone quitlines.  Printed Furniture conservator/restorer.  Support groups or group counseling.  Text messaging programs.  Mobile phone applications.  Taking medicines to help you quit smoking. (If you are pregnant or breastfeeding, talk with your health care provider first.) Some medicines contain nicotine and some do not. Both types of medicines help with cravings, but the medicines that include nicotine help to relieve withdrawal symptoms. Your health care provider may recommend:  Nicotine patches, gum, or lozenges.  Nicotine inhalers or sprays.  Non-nicotine medicine that is taken by mouth. Talk with your health care provider about combining strategies, such as taking medicines while you are also receiving in-person counseling. Using these two strategies together makes you more likely to succeed in quitting than if you used either strategy on its own. If you are pregnant or breastfeeding, talk with your health care provider about finding counseling or other support strategies to quit smoking. Do not take medicine to help you quit smoking unless told to do so by your health care provider. WHAT THINGS CAN I DO TO MAKE IT EASIER TO QUIT? Quitting smoking might feel overwhelming at first, but there is a lot that you can do to make it easier. Take these important actions:  Reach out to your family and friends and ask that they support and encourage you during this time. Call telephone quitlines, reach out to support groups, or work with a counselor for support.  Ask people who smoke to avoid smoking around you.  Avoid places that trigger you to smoke, such as bars, parties, or smoke-break areas at work.  Spend time around people who do  not smoke.  Lessen stress in your life, because stress can be a smoking trigger for some people. To lessen stress, try:  Exercising regularly.  Deep-breathing exercises.  Yoga.  Meditating.  Performing a body scan. This involves closing your eyes, scanning your body from head to toe, and noticing which parts of your body are particularly tense. Purposefully relax the muscles in those areas.  Download or purchase mobile phone or tablet apps (applications) that can help you stick to your quit plan by providing reminders, tips, and encouragement. There are many free apps, such as QuitGuide from the [employer] Office manager for Disease Control and Prevention). You can find other support for quitting smoking (smoking cessation) through smokefree.gov and other websites. HOW WILL I FEEL WHEN I QUIT SMOKING? Within the first 24 hours of quitting smoking, you may start to feel some withdrawal symptoms. These symptoms are usually most noticeable 2-3 days after quitting, but they usually do not last beyond 2-3 weeks. Changes or symptoms that you might experience include:  Mood swings.  Restlessness, anxiety, or irritation.  Difficulty concentrating.  Dizziness.  Strong cravings for sugary foods in addition to nicotine.  Mild weight gain.  Constipation.  Nausea.  Coughing or a sore throat.  Changes in how your medicines work in your body.  A depressed mood.  Difficulty sleeping (insomnia). After the first 2-3 weeks of quitting, you may start to notice more positive results, such as:  Improved sense of smell and taste.  Decreased coughing and sore throat.  Slower heart rate.  Lower blood pressure.  Clearer skin.  The ability to breathe more easily.  Fewer sick days. Quitting smoking is very challenging for most people. Do not get discouraged if you are not successful the first time. Some people need to make many attempts to quit before they achieve long-term success. Do your best to  stick to your quit plan, and talk with your health care provider if you have any questions or concerns.   This information is not intended to replace advice given to you by your health care provider. Make sure you discuss any questions you have with your health care provider.   Document Released: 02/24/2001 Document Revised: 07/17/2014 Document Reviewed: 07/17/2014 Elsevier Interactive Patient Education 2016 [employer].  Smoking Cessation, Tips for Success If you are ready to quit smoking,  congratulations! You have chosen to help yourself be healthier. Cigarettes bring nicotine, tar, carbon monoxide, and other irritants into your body. Your lungs, heart, and blood vessels will be able to work better without these poisons. There are many different ways to quit smoking. Nicotine gum, nicotine patches, a nicotine inhaler, or nicotine nasal spray can help with physical craving. Hypnosis, support groups, and medicines help break the habit of smoking. WHAT THINGS CAN I DO TO MAKE QUITTING EASIER?  Here are some tips to help you quit for good:  Pick a date when you will quit smoking completely. Tell all of your friends and family about your plan to quit on that date.  Do not try to slowly cut down on the number of cigarettes you are smoking. Pick a quit date and quit smoking completely starting on that day.  Throw away all cigarettes.   Clean and remove all ashtrays from your home, work, and car.  On a card, write down your reasons for quitting. Carry the card with you and read it when you get the urge to smoke.  Cleanse your body of nicotine. Drink enough water and fluids to keep your urine clear or pale yellow. Do this after quitting to flush the nicotine from your body.  Learn to predict your moods. Do not let a bad situation be your excuse to have a cigarette. Some situations in your life might tempt you into wanting a cigarette.  Never have "just one" cigarette. It leads to wanting  another and another. Remind yourself of your decision to quit.  Change habits associated with smoking. If you smoked while driving or when feeling stressed, try other activities to replace smoking. Stand up when drinking your coffee. Brush your teeth after eating. Sit in a different chair when you read the paper. Avoid alcohol while trying to quit, and try to drink fewer caffeinated beverages. Alcohol and caffeine may urge you to smoke.  Avoid foods and drinks that can trigger a desire to smoke, such as sugary or spicy foods and alcohol.  Ask people who smoke not to smoke around you.  Have something planned to do right after eating or having a cup of coffee. For example, plan to take a walk or exercise.  Try a relaxation exercise to calm you down and decrease your stress. Remember, you may be tense and nervous for the first 2 weeks after you quit, but this will pass.  Find new activities to keep your hands busy. Play with a pen, coin, or rubber band. Doodle or draw things on paper.  Brush your teeth right after eating. This will help cut down on the craving for the taste of tobacco after meals. You can also try mouthwash.   Use oral substitutes in place of cigarettes. Try using lemon drops, carrots, cinnamon sticks, or chewing gum. Keep them handy so they are available when you have the urge to smoke.  When you have the urge to smoke, try deep breathing.  Designate your home as a nonsmoking area.  If you are a heavy smoker, ask your health care provider about a prescription for nicotine chewing gum. It can ease your withdrawal from nicotine.  Reward yourself. Set aside the cigarette money you save and buy yourself something nice.  Look for support from others. Join a support group or smoking cessation program. Ask someone at home or at work to help you with your plan to quit smoking.  Always ask yourself, "Do I need this cigarette  or is this just a reflex?" Tell yourself, "Today, I  choose not to smoke," or "I do not want to smoke." You are reminding yourself of your decision to quit.  Do not replace cigarette smoking with electronic cigarettes (commonly called e-cigarettes). The safety of e-cigarettes is unknown, and some may contain harmful chemicals.  If you relapse, do not give up! Plan ahead and think about what you will do the next time you get the urge to smoke. HOW WILL I FEEL WHEN I QUIT SMOKING? You may have symptoms of withdrawal because your body is used to nicotine (the addictive substance in cigarettes). You may crave cigarettes, be irritable, feel very hungry, cough often, get headaches, or have difficulty concentrating. The withdrawal symptoms are only temporary. They are strongest when you first quit but will go away within 10-14 days. When withdrawal symptoms occur, stay in control. Think about your reasons for quitting. Remind yourself that these are signs that your body is healing and getting used to being without cigarettes. Remember that withdrawal symptoms are easier to treat than the major diseases that smoking can cause.  Even after the withdrawal is over, expect periodic urges to smoke. However, these cravings are generally short lived and will go away whether you smoke or not. Do not smoke! WHAT RESOURCES ARE AVAILABLE TO HELP ME QUIT SMOKING? Your health care provider can direct you to community resources or hospitals for support, which may include:  Group support.  Education.  Hypnosis.  Therapy.   This information is not intended to replace advice given to you by your health care provider. Make sure you discuss any questions you have with your health care provider.   Document Released: 11/29/2003 Document Revised: 03/23/2014 Document Reviewed: 08/18/2012 Elsevier Interactive Patient Education [employer].

## 2016-05-01 NOTE — Progress Notes (Signed)
Subjective:    Patient ID: [name], male    DOB: 03-15-1971, 46 y.o.   MRN: [id number]  Chief Complaint  Patient presents with  . Establish Care    states he has been having weakness since he had a stroke last month    HPI:  [name] is a 46 y.o. male who  has a past medical history of Adenomatous colon polyp (08/2015); Anxiety; Aortic disease ([place]); Aortic dissection (HCC); CAD (coronary artery disease); Cardiomyopathy ([place]); Chronic abdominal pain; Chronic combined systolic and diastolic congestive heart failure ([place]); Chronic sinusitis; CKD (chronic kidney disease); Colon polyps (09/11/2015); DDD (degenerative disc disease), lumbar; Depression; Descending thoracic aortic aneurysm ([place]); Dissecting aneurysm of thoracic aorta ([place]); ETOH abuse; Frequent headaches; GERD (gastroesophageal reflux disease); Headache(784.0); Heart murmur; Hemorrhoid thrombosis; History of echocardiogram; History of medication noncompliance; HYPERLIPIDEMIA; Hypertension; INGUINAL HERNIA; Pneumonia (~ 2013); Renal insufficiency; Stroke [facility]); Tobacco abuse; and Valvular heart disease. and presents today for an an office visit to establish care.  1.)  Stroke - Recently evaluated in the emergency department and admitted to the hospital with new onset right-sided upper and lower extremity weakness. CT scan was negative for intracranial pathology. During CT scan he was noticed to have a seizure lasting approximately 2 minutes described as tonic-clonic. 6 was treated with Ativan with no further seizures. CTA of the head and neck showed moderate narrowing of the left MCA M2 segments and moderate to severe narrowing of the right PCA P2 segment. He was discharged on Plavix for anticoagulation. TEE attempted but unsuccessful. He was also discharged on Keppra for seizure prevention. All hospital records, labs, and imaging reviewed in detail.  He continues to work on cognitive function with speech therapy and has  completed his occupational and physical therapy. Continues to experience right sided numbness of his hand and notes that he cannot use it to the full capacity that he would like. He is ambidextrous. Describes easily being fatigued by his medications which make him feel drowsy at times. He is not currently driving. Reports taking his medications as prescribed and denies adverse side effects. His next neurology appointment is on 3/20 with Dr. Erlinda Hong of [facility].   2.) Hypertension - currently maintained on amlodipine, clonidine, labetalol, and hydralazine. Notes he has been out of hydralazine for 4 days. Reports taking the other medications as prescribed and denies adverse side effects. Blood pressures at home have been ranging in the 120 to 130s on average. Denies worse headache of life with no new symptoms of end organ damage. Working on following a low-sodium diet.  BP Readings from Last 3 Encounters:  05/01/16 (!) 142/88  04/07/16 (!) 150/97  03/31/16 (!) 181/97    3.) GERD - Currently maintained on omeprazole. Reports taking the medications as prescribed and denies adverse side effects. Symptoms are generally well controlled with the current medication regimen.   4.) Goal LDL of less than 70 - currently maintained on atorvastatin. Reports taking medication as prescribed and denies adverse side effects or myalgias. No chest pain, heart palpitations, shortness of breath, or paroxysmal nocturnal dyspnea.  Lab Results  Component Value Date   CHOL 178 03/04/2016   HDL 30 (L) 03/04/2016   LDLCALC 124 (H) 03/04/2016   TRIG 119 03/04/2016   CHOLHDL 5.9 03/04/2016     Allergies  Allergen Reactions  . Imdur [Isosorbide Nitrate] Other (See Comments)    headache  . Tramadol Other (See Comments)    "headaches" per pt  Outpatient Medications Prior to Visit  Medication Sig Dispense Refill  . acetaminophen (TYLENOL) 500 MG tablet Take 1,000 mg by mouth 3 (three) times daily.    [name]  acetaminophen-codeine (TYLENOL #3) 300-30 MG tablet Take 1 tablet by mouth every 8 (eight) hours as needed for moderate pain. 15 tablet 0  . amLODipine (NORVASC) 10 MG tablet Take 10 mg by mouth daily.    [name] atorvastatin (LIPITOR) 20 MG tablet Take 1 tablet (20 mg total) by mouth daily. 30 tablet 0  . cloNIDine (CATAPRES) 0.2 MG tablet Take 1 tablet (0.2 mg total) by mouth 3 (three) times daily. (Patient taking differently: Take 0.2 mg by mouth 2 (two) times daily. )    . clopidogrel (PLAVIX) 75 MG tablet Take 1 tablet (75 mg total) by mouth daily. 30 tablet 0  . DULoxetine (CYMBALTA) 30 MG capsule Take 30 mg by mouth daily.    [name] labetalol (NORMODYNE) 300 MG tablet Take 2 tablets (600 mg total) by mouth 2 (two) times daily. (Patient taking differently: Take 300 mg by mouth 2 (two) times daily. ) 240 tablet 6  . levETIRAcetam (KEPPRA) 500 MG tablet Take 1 tablet (500 mg total) by mouth 2 (two) times daily. 60 tablet 0  . lidocaine (LIDODERM) 5 % Place 1 patch onto the skin daily. Remove & Discard patch within 12 hours or as directed by MD    . meclizine (ANTIVERT) 25 MG tablet Take 25 mg by mouth 3 (three) times daily as needed for nausea.    [name] omeprazole (PRILOSEC) 20 MG capsule Take 1 capsule (20 mg total) by mouth daily. 30 capsule 0  . hydrALAZINE (APRESOLINE) 50 MG tablet Take 1 tablet (50 mg total) by mouth 2 (two) times daily. 60 tablet 0   No facility-administered medications prior to visit.      Past Medical History:  Diagnosis Date  . Adenomatous colon polyp 08/2015  . Anxiety   . Aortic disease ([place])   . Aortic dissection (HCC)    a. admx 04/2014 >> L renal infarct; a/c renal failure >> b.  s/p Bioprosthetic [place] and total arch replacement and staged endovascular repair of descending aortic aneurysm ([facility] - Dr. [name])  . CAD (coronary artery disease)    a. LHC 4/16:  oD1 60%  . Cardiomyopathy ([place])    a. non-ischemic - probably related to untreated HTN and ETOH abuse - Echo  3/13 with EF 35-40% >> b. Echo 4/16: Severe LVH, EF 55-60%, moderate AI, moderate MR, mild LAE, trivial effusion, known type B dissection with communication between true and false lumens with suprasternal images suggesting dissection plane may propagate to at least left subclavian takeoff, root above aortic valve okay    . Chronic abdominal pain   . Chronic combined systolic and diastolic congestive heart failure ([place])    a. 05/2011: Adm with pulm edema/HTN urgency, EF 35-40% with diffuse hypokinesis and moderate to severe mitral regurgitation. Cardiomyopathy likely due to uncontrolled HTN and ETOH abuse - cath deferred due to renal insufficiency (felt due to uncontrolled HTN). bJodie Echevaria MV 06/2011: EF 37% and no ischemia or infarction. c. EF 45-50% by echo 01/2012.  [name] Chronic sinusitis   . CKD (chronic kidney disease)    a. 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  peak creatinine 3.46 during admx for aortic dissection 2/16.  sees Dr [name]  . Colon polyps 09/11/2015   Descending, sigmoid polyps  . DDD (degenerative disc disease), lumbar   . Depression   .  Descending thoracic aortic aneurysm ([place])   . Dissecting aneurysm of thoracic aorta ([place])   . ETOH abuse    a. Reported to have quit 05/2011.  . Frequent headaches   . GERD (gastroesophageal reflux disease)   . Headache(784.0)    "q other day" (08/08/2013)  . Heart murmur   . Hemorrhoid thrombosis   . History of echocardiogram    Echo 1/17:  Severe LVH, EF 55-60%, no RWMA, Gr 2 DD, AVR ok, mild to mod MR, mild LAE, mild reduced RVSF, mod RAE  . History of medication noncompliance   . HYPERLIPIDEMIA   . Hypertension    a. Hx of HTN urgency secondary to noncompliance. b. urinary metanephrine and catecholeamine levels normal 2013.  c. Renal art [name] 1/16:  No evidence of renal artery stenosis noted bilaterally.  . INGUINAL HERNIA   . Pneumonia ~ 2013  . Renal insufficiency   . Stroke ([place])   . Tobacco abuse   . Valvular heart disease    a. Echo  05/2011: moderate to severe eccentric MR and mild to moderate AI with prolapsing left coronary cusp. b. Echo 01/2012: mild-mod AI, mild dilitation of aortic root, mild MR.;  c. Echo 1/16: Severe LVH consistent with hypertrophic cardio myopathy, EF 50%, no RWMA, mod AI, mild MR, mild RAE, dilated Ao root (40 mm);       Past Surgical History:  Procedure Laterality Date  . ANKLE SURGERY Bilateral    Fractures bilaterally  . AORTIC VALVE SURGERY  09/2014  . FOOT FRACTURE SURGERY Bilateral 2004-2010   "got pins in both of them"  . HEMORRHOID SURGERY N/A 06/15/2015   Procedure: HEMORRHOIDECTOMY;  Surgeon: Stark Klein, MD;  Location: [place];  Service: General;  Laterality: N/A;  . INGUINAL HERNIA REPAIR Right ~ 1996  . LEFT HEART CATHETERIZATION WITH CORONARY ANGIOGRAM N/A 06/21/2014   Procedure: LEFT HEART CATHETERIZATION WITH CORONARY ANGIOGRAM;  Surgeon: Larey Dresser, MD;  Location: [facility] CATH LAB;  Service: Cardiovascular;  Laterality: N/A;  . TEE WITHOUT CARDIOVERSION N/A 03/12/2016   Procedure: TRANSESOPHAGEAL ECHOCARDIOGRAM (TEE);  Surgeon: Sanda Klein, MD;  Location: [facility] ENDOSCOPY;  Service: Cardiovascular;  Laterality: N/A;      Family History  Problem Relation Age of Onset  . Hypertension Mother   . Hypertension    . Colon cancer Paternal Uncle   . Stroke Maternal Aunt   . Heart attack Brother   . Diabetes Maternal Aunt   . Lung cancer Maternal Uncle   . Throat cancer Neg Hx   . Pancreatic cancer Neg Hx   . Esophageal cancer Neg Hx   . Kidney disease Neg Hx   . Liver disease Neg Hx       Social History   Social History  . Marital status: Married    Spouse name: N/A  . Number of children: 5  . Years of education: 12   Occupational History  . Disabled, part-time Programmer, systems     Disability   Social History Main Topics  . Smoking status: Current Some Day Smoker    Packs/day: 0.25    Years: 23.00    Types: Cigarettes  . Smokeless tobacco: Former Systems developer    Types:  Chew     Comment: as of 05-22-14 down to 3-4 cigs daily- 06/14/15- 4 cigs  . Alcohol use No     Comment: + previous use  . Drug use: Yes    Types: Marijuana     Comment: 08/08/2013 "twice a  month"  last time 06/13/15  . Sexual activity: Yes   Other Topics Concern  . Not on file   Social History Narrative   Fun: Enjoy his children      Review of Systems  Constitutional: Negative for chills and fever.  Eyes:       Negative for changes in vision  Respiratory: Negative for cough, chest tightness and wheezing.   Cardiovascular: Negative for chest pain, palpitations and leg swelling.  Neurological: Negative for dizziness, weakness and light-headedness.       Objective:    BP (!) 142/88 (BP Location: Left Arm, Patient Position: Sitting, Cuff Size: Large)   Pulse 67   Temp 97.9 F (36.6 C) (Oral)   Resp 16   Ht 5\' 9"  (1.753 m)   Wt 159 lb (72.1 kg)   SpO2 96%   BMI 23.48 kg/m  Nursing note and vital signs reviewed.  Physical Exam  Constitutional: He is oriented to person, place, and time. He appears well-developed and well-nourished. No distress.  Eyes: Conjunctivae and EOM are normal. Pupils are equal, round, and reactive to light.  Cardiovascular: Normal rate, regular rhythm, normal heart sounds and intact distal pulses.   Pulmonary/Chest: Effort normal and breath sounds normal.  Neurological: He is alert and oriented to person, place, and time. No cranial nerve deficit.  Neurological exam appears intact with normal muscle strength in his bilateral upper and lower extremities. Range of motion is normal. No obvious throughout droop.  Skin: Skin is warm and dry.  Psychiatric: He has a normal mood and affect. His behavior is normal. Judgment and thought content normal.       Assessment & Plan:   Problem List Items Addressed This Visit      Cardiovascular and Mediastinum   Essential hypertension - Primary    Blood pressure close to goal 140/90 with current medication  regimen without hydralazine 3 days. Denies worse headache of life with no new symptoms of end organ damage noted on physical exam. Encouraged to monitor blood pressure at home and follow low-sodium diet. Obtain complete metabolic profile and CBC. Continue current dosage of hydralazine, clonidine, labetalol, and amlodipine.      Relevant Medications   hydrALAZINE (APRESOLINE) 50 MG tablet   sildenafil (REVATIO) 20 MG tablet   Cerebrovascular accident (CVA) due to embolism of left middle cerebral artery ([place])    Continues to work with speech language pathology has been released from occupational and physical therapy. Neurological exam is very good with minimal weakness noted in the right upper and lower extremity. Sensation is generally intact and appropriate with mild decrease in the right upper extremity. Continue to work on modified will risk factors including tobacco use, hypertension, and elevated cholesterol levels. He continues to work on tobacco cessation with information provided in AVS. Follow up with Neurology as scheduled. Continue to monitor.       Relevant Medications   hydrALAZINE (APRESOLINE) 50 MG tablet   sildenafil (REVATIO) 20 MG tablet   Other Relevant Orders   CBC   Comprehensive metabolic panel   Lipid panel     Other   Hyperlipidemia LDL goal <70    LDL goal less than 70 and currently maintained on atorvastatin with no adverse side effects. Obtain lipid profile. Continue current dosage of atorvastatin pending lipid profile results.      Relevant Medications   hydrALAZINE (APRESOLINE) 50 MG tablet   sildenafil (REVATIO) 20 MG tablet       I am  having Mr. Brumett start on sildenafil and pantoprazole. I am also having him maintain his labetalol, acetaminophen-codeine, cloNIDine, acetaminophen, amLODipine, lidocaine, DULoxetine, meclizine, clopidogrel, levETIRAcetam, atorvastatin, omeprazole, and hydrALAZINE.   Meds ordered this encounter  Medications  .  hydrALAZINE (APRESOLINE) 50 MG tablet    Sig: Take 1 tablet (50 mg total) by mouth 2 (two) times daily.    Dispense:  180 tablet    Refill:  0    Order Specific Question:   Supervising Provider    Answer:   Pricilla Holm A [[id number]]  . sildenafil (REVATIO) 20 MG tablet    Sig: Take 1-5 tablets (20-100 mg total) by mouth daily as needed.    Dispense:  50 tablet    Refill:  1    Order Specific Question:   Supervising Provider    Answer:   Pricilla Holm A [[id number]]  . pantoprazole (PROTONIX) 40 MG tablet    Sig: Take 1 tablet (40 mg total) by mouth daily.    Dispense:  90 tablet    Refill:  0    Order Specific Question:   Supervising Provider    Answer:   Pricilla Holm A [[id number]]     Follow-up: Return in about 2 months (around 06/29/2016), or if symptoms worsen or fail to improve.  Mauricio Po, FNP

## 2016-05-01 NOTE — Assessment & Plan Note (Signed)
Blood pressure close to goal 140/90 with current medication regimen without hydralazine 3 days. Denies worse headache of life with no new symptoms of end organ damage noted on physical exam. Encouraged to monitor blood pressure at home and follow low-sodium diet. Obtain complete metabolic profile and CBC. Continue current dosage of hydralazine, clonidine, labetalol, and amlodipine.

## 2016-05-01 NOTE — Assessment & Plan Note (Signed)
Continues to work with speech language pathology has been released from occupational and physical therapy. Neurological exam is very good with minimal weakness noted in the right upper and lower extremity. Sensation is generally intact and appropriate with mild decrease in the right upper extremity. Continue to work on modified will risk factors including tobacco use, hypertension, and elevated cholesterol levels. He continues to work on tobacco cessation with information provided in AVS. Follow up with Neurology as scheduled. Continue to monitor.

## 2016-05-04 ENCOUNTER — Ambulatory Visit: Payer: Medicare Other | Admitting: Speech Pathology

## 2016-05-04 ENCOUNTER — Encounter: Payer: Medicare Other | Admitting: Occupational Therapy

## 2016-05-04 DIAGNOSIS — R4701 Aphasia: Secondary | ICD-10-CM

## 2016-05-04 DIAGNOSIS — R482 Apraxia: Secondary | ICD-10-CM

## 2016-05-04 NOTE — Therapy (Signed)
[facility] [address] [place], [place], [address] Phone: [phone]   Fax:  [phone]  Speech Language Pathology Treatment  Patient Details  Name: [name] MRN: [id number] Date of Birth: 05/26/1970 Referring Provider: Rosalin Hawking MD  Encounter Date: 05/04/2016      End of Session - 05/04/16 1636    Visit Number 3   Number of Visits 17   Date for SLP Re-Evaluation 06/19/16   Authorization Type medicaid secondary   SLP Start Time 1531   SLP Stop Time  1617   SLP Time Calculation (min) 46 min   Activity Tolerance Patient tolerated treatment well      Past Medical History:  Diagnosis Date  . Adenomatous colon polyp 08/2015  . Anxiety   . Aortic disease ([place])   . Aortic dissection (H[facility])    a. admx 04/2014 >> L renal infarct; a/c renal failure >> b.  s/p Bioprosthetic [place] and total arch replacement and staged endovascular repair of descending aortic aneurysm ([facility] - Dr. [name])  . CAD (coronary artery disease)    a. LHC 4/16:  oD1 60%  . Cardiomyopathy ([place])    a. non-ischemic - probably related to untreated HTN and ETOH abuse - Echo 3/13 with EF 35-40% >> b. Echo 4/16: Severe LVH, EF 55-60%, moderate AI, moderate MR, mild LAE, trivial effusion, known type B dissection with communication between true and false lumens with suprasternal images suggesting dissection plane may propagate to at least left subclavian takeoff, root above aortic valve okay    . Chronic abdominal pain   . Chronic combined systolic and diastolic congestive heart failure ([place])    a. 05/2011: Adm with pulm edema/HTN urgency, EF 35-40% with diffuse hypokinesis and moderate to severe mitral regurgitation. Cardiomyopathy likely due to uncontrolled HTN and ETOH abuse - cath deferred due to renal insufficiency (felt due to uncontrolled HTN). bJodie Echevaria MV 06/2011: EF 37% and no ischemia or infarction. c. EF 45-50% by echo 01/2012.  [name] Chronic  sinusitis   . CKD (chronic kidney disease)    a. 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  peak creatinine 3.46 during admx for aortic dissection 2/16.  sees Dr [name]  . Colon polyps 09/11/2015   Descending, sigmoid polyps  . DDD (degenerative disc disease), lumbar   . Depression   . Descending thoracic aortic aneurysm ([place])   . Dissecting aneurysm of thoracic aorta ([place])   . ETOH abuse    a. Reported to have quit 05/2011.  . Frequent headaches   . GERD (gastroesophageal reflux disease)   . Headache(784.0)    "q other day" (08/08/2013)  . Heart murmur   . Hemorrhoid thrombosis   . History of echocardiogram    Echo 1/17:  Severe LVH, EF 55-60%, no RWMA, Gr 2 DD, AVR ok, mild to mod MR, mild LAE, mild reduced RVSF, mod RAE  . History of medication noncompliance   . HYPERLIPIDEMIA   . Hypertension    a. Hx of HTN urgency secondary to noncompliance. b. urinary metanephrine and catecholeamine levels normal 2013.  c. Renal art [name] 1/16:  No evidence of renal artery stenosis noted bilaterally.  . INGUINAL HERNIA   . Pneumonia ~ 2013  . Renal insufficiency   . Stroke ([place])   . Tobacco abuse   . Valvular heart disease    a. Echo 05/2011: moderate to severe eccentric MR and mild to moderate AI with prolapsing left coronary cusp. b. Echo 01/2012: mild-mod AI,  mild dilitation of aortic root, mild MR.;  c. Echo 1/16: Severe LVH consistent with hypertrophic cardio myopathy, EF 50%, no RWMA, mod AI, mild MR, mild RAE, dilated Ao root (40 mm);     Past Surgical History:  Procedure Laterality Date  . ANKLE SURGERY Bilateral    Fractures bilaterally  . AORTIC VALVE SURGERY  09/2014  . FOOT FRACTURE SURGERY Bilateral 2004-2010   "got pins in both of them"  . HEMORRHOID SURGERY N/A 06/15/2015   Procedure: HEMORRHOIDECTOMY;  Surgeon: Stark Klein, MD;  Location: [facility];  Service: General;  Laterality: N/A;  . INGUINAL HERNIA REPAIR Right ~ 1996  . LEFT HEART CATHETERIZATION WITH CORONARY ANGIOGRAM N/A 06/21/2014    Procedure: LEFT HEART CATHETERIZATION WITH CORONARY ANGIOGRAM;  Surgeon: Larey Dresser, MD;  Location[facility] CATH LAB;  Service: Cardiovascular;  Laterality: N/A;  . TEE WITHOUT CARDIOVERSION N/A 03/12/2016   Procedure: TRANSESOPHAGEAL ECHOCARDIOGRAM (TEE);  Surgeon: Sanda Klein, MD;  Location: [facility] ENDOSCOPY;  Service: Cardiovascular;  Laterality: N/A;    There were no vitals filed for this visit.      Subjective Assessment - 05/04/16 1532    Subjective "Little things coming together slowly, I been noticing I'm getting better each week." re: communication   Currently in Pain? No/denies               ADULT SLP TREATMENT - 05/04/16 0001      General Information   Behavior/Cognition Alert;Cooperative;Pleasant mood   Patient Positioning Upright in chair     Treatment Provided   Treatment provided Cognitive-Linquistic     Pain Assessment   Pain Assessment No/denies pain     Cognitive-Linquistic Treatment   Treatment focused on Aphasia;Patient/family/caregiver education   Skilled Treatment Pt states he has been texting a lot and has noticed he is able to write more quickly. He missed 2 previous sessions, one due to illness and the second due to a funeral. States he completed home tasks which he forgot to bring today. Patient states he has been "challenging" himself when texting by avoiding predictive text feature so that he writes complete words. Provided education regarding additional features for his phone to continue writing practice including handwriting to text application. For written expression, prompted patient to provide written responses to typical questions posed via text message. Patient composed 5-6 word responses with min A to review message to identify and correct spelling mistakes. Progressed to multi-sentence level writing task. Patient composed instructions for a basic recipe with min A for initiation, cues to verbalize instructions prior to writing, and to review  instructions to correct spelling errors and remove extraneous words. Patient required occasional cues to utilize verbs in his instructions. Instructed patient in sentence-level verb-strengthening task to continue for home exercise, in addition to writing basic recipes and work-related instructions.     Assessment / Recommendations / Plan   Plan Continue with current plan of care     Progression Toward Goals   Progression toward goals Progressing toward goals          SLP Education - 05/04/16 1635    Education provided Yes   Education Details HEP, multimodal communication tools   Person(s) Educated Patient   Methods Explanation;Demonstration   Comprehension Verbalized understanding;Returned demonstration          SLP Short Term Goals - 05/04/16 1640      SLP SHORT TERM GOAL #1   Title pt will write 4-5 word sentence responses 90% success with occasional min A  Time 3   Period Weeks   Status Partially Met     SLP SHORT TERM GOAL #2   Title pt will complete HEP for written language with independence, procedurally   Baseline 2.8   Time 3   Period Weeks   Status On-going     SLP SHORT TERM GOAL #3   Title pt will report, subjectively, incr'd fluidity in his conversational speech compared to pre-ST   Time 3   Period Weeks   Status Partially Met          SLP Long Term Goals - 05/04/16 1642      SLP LONG TERM GOAL #1   Title pt will write or text 2-3 sentence functional material with modified indpendence    Time 7   Period Weeks   Status On-going     SLP LONG TERM GOAL #2   Title pt will report incr'd QOL, due to better written language, than prior to ST course   Time 7   Period Weeks   Status On-going          Plan - 05/04/16 1636    Clinical Impression Statement Patient continues to present with verbal and written expressive language deficits. His speech and writing are notably more fluid today, and he was able to progress to multi-sentence level writing  task. He is motivated to improve his writing and reports he has been making efforts at home. Given rate of progress, plan to target writing via functional tasks vs. phonemic awareness level activities. With verbal cues, patient shows emerging capabilities to review and correct written mistakes. Pt will benefit from skilled ST to address expressive aphasia, improve verbal and written communication and QOL.   Speech Therapy Frequency 2x / week   Duration Other (comment)   Treatment/Interventions Language facilitation;Compensatory techniques;Internal/external aids;SLP instruction and feedback;Multimodal communcation approach;Patient/family education;Functional tasks   Potential to Achieve Goals Good   SLP Home Exercise Plan writing exercise   Consulted and Agree with Plan of Care Patient      Patient will benefit from skilled therapeutic intervention in order to improve the following deficits and impairments:   Aphasia  Apraxia    Problem List Patient Active Problem List   Diagnosis Date Noted  . Palpitations   . Cerebrovascular accident (CVA) due to embolism of left middle cerebral artery ([place])   . Malnutrition of moderate degree 02/29/2016  . Seizure ([place]) 02/28/2016  . HLD (hyperlipidemia) 08/28/2015  . Thrombosed hemorrhoids 06/14/2015  . Rectal bleeding 06/14/2015  . Rectal pain 06/14/2015  . 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[place]) 06/07/2015  . Cardiomyopathy ([place]) 03/01/2015  . Acid reflux 03/01/2015  . Essential hypertension 03/01/2015  . Cardiac chest pain 12/10/2014  . Abdominal pain 10/12/2014  . Injury of kidney 10/03/2014  . S/P aortic dissection repair 10/03/2014  . H/O aortic valve replacement 10/03/2014  . Pre-op testing 06/15/2014  . CKD (chronic kidney disease) 05/15/2014  . Hypoxia   . AKI (acute kidney injury) ([place])   . Aortic dissection, thoracoabdominal ([place])   . Dissecting aneurysm of thoracic aorta, Stanford type B ([place]) 04/24/2014  . Aortic  dissection, thoracic ([place]) 04/24/2014  . Aortic dissection ([place]) 04/23/2014  . Aneurysm of ascending aorta (HCC) 03/28/2014  . Dyspnea 03/27/2014  . Prolonged Q-T interval on ECG 03/27/2014  . ETOH abuse 09/20/2013  . Hypertensive crisis 08/08/2013  . Sinusitis, bacterial 08/08/2013  . Malignant hypertension 02/20/2013  . Hypertensive urgency 01/27/2012  . Cardiomyopathy, hypertensive ([place]) 01/26/2012  .  AI (aortic insufficiency) 01/26/2012  . MR (mitral regurgitation) 01/26/2012  . Acute renal failure superimposed on stage 3 chronic kidney disease ([place]) 01/26/2012  . Systolic CHF, chronic ([place]) 06/10/2011  . Chronic bronchitis 05/23/2011  . Hypertensive emergency 05/22/2011  . Cardiomegaly - hypertensive 05/22/2011  . Tobacco abuse 05/22/2011  . Marijuana use 05/22/2011  . Abdominal pain 05/22/2011  . Hypertension, malignant 05/22/2011  . Anxiety and depression 05/22/2011  . Hyperlipidemia LDL goal <70 08/26/2009  . Tobacco use 08/26/2009  . Headache(784.0) 08/26/2009  . Aortic valve disorder 03/11/2009  . INGUINAL HERNIA 02/18/2009  . Uncontrolled hypertension 01/16/2009  . MURMUR 01/16/2009   Deneise Lever, [place] CF-SLP Speech-Language Pathologist  Aliene Altes 05/04/2016, 4:42 PM  [facility] [address] [place], [place], [address] Phone: [phone]   Fax:  [phone]   Name: DONAVYN FECHER MRN: [id number] Date of Birth: 22-Jan-1971

## 2016-05-07 ENCOUNTER — Encounter: Payer: Medicare Other | Admitting: Occupational Therapy

## 2016-05-07 ENCOUNTER — Ambulatory Visit: Payer: Medicare Other | Admitting: Speech Pathology

## 2016-05-09 IMAGING — CT CT HEAD W/O CM
2 series · 15 of 30 positions shown, 19 images · non-contrast
Comparison: 08/08/2013, 09/09/2010

CLINICAL DATA: Worsened headache

EXAM:
CT HEAD WITHOUT CONTRAST
TECHNIQUE: Contiguous axial images were obtained from the base of the skull
through the vertex without intravenous contrast.

[Series 201: head w/o, idose (1) · axial · non-contrast · 0.44mm/px · z∈[+74,+194]mm · 13 of 30 slices shown, 17 images]
[im 3/30  brain]
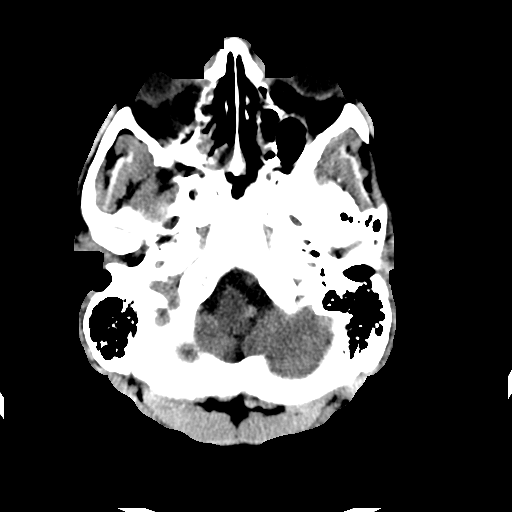
[im 3/30  bone]
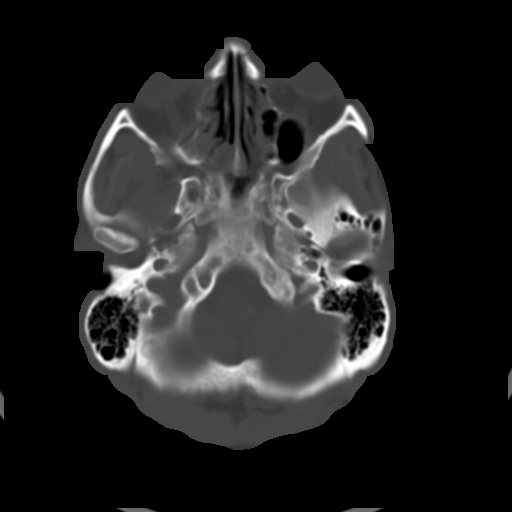
[im 5/30  brain]
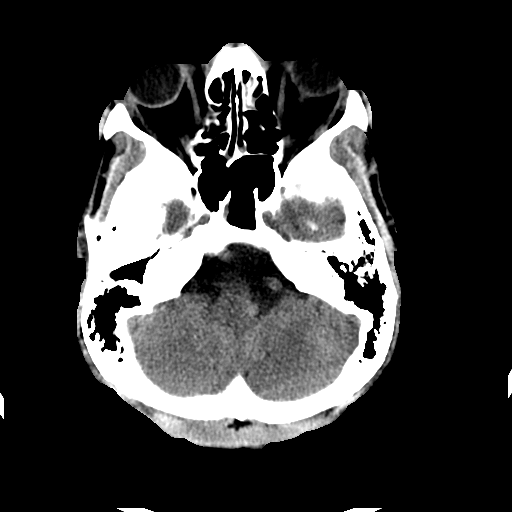
[im 7/30  brain]
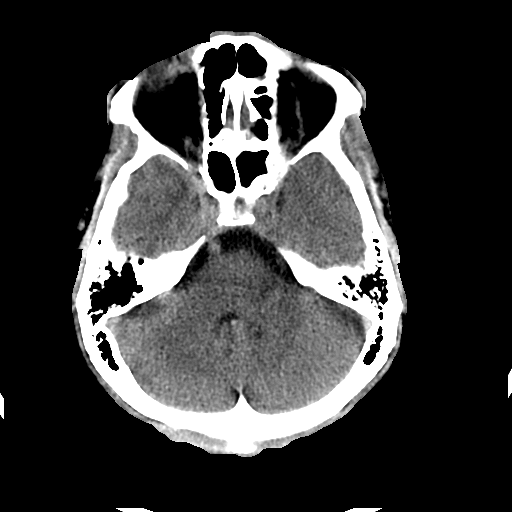
[im 9/30  brain]
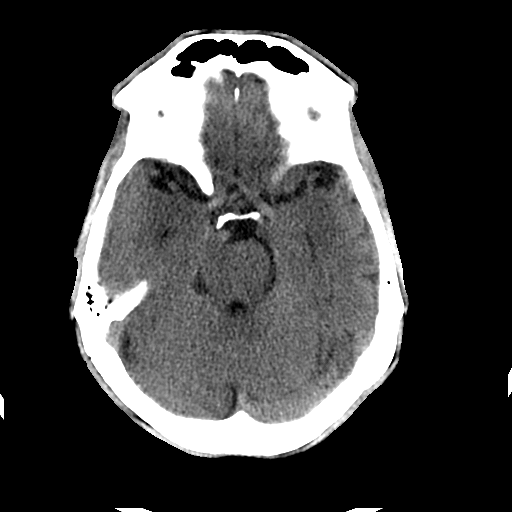
[im 11/30  brain]
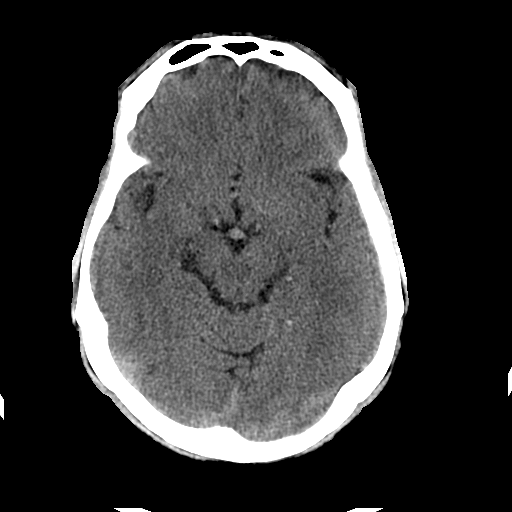
[im 11/30  bone]
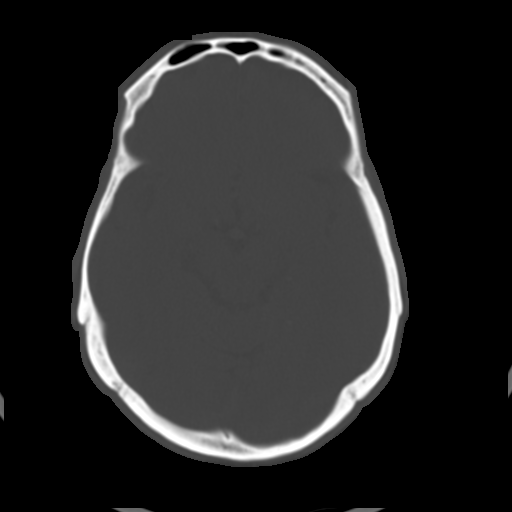
[im 13/30  brain]
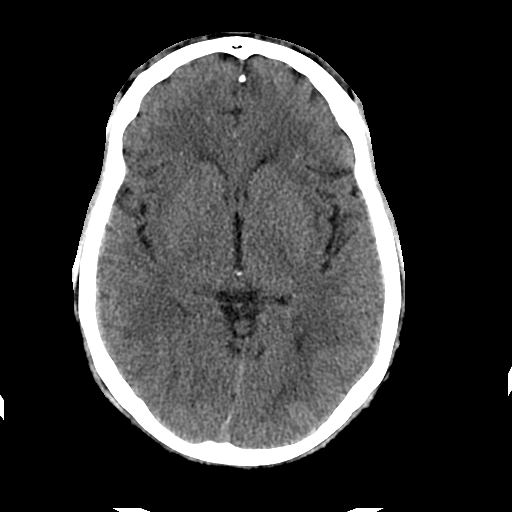
[im 15/30  brain]
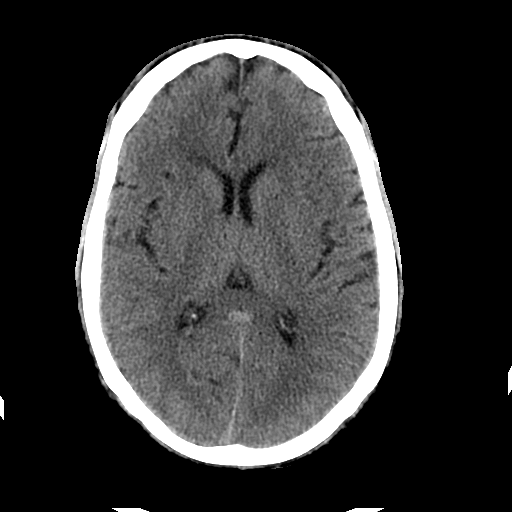
[im 17/30  brain]
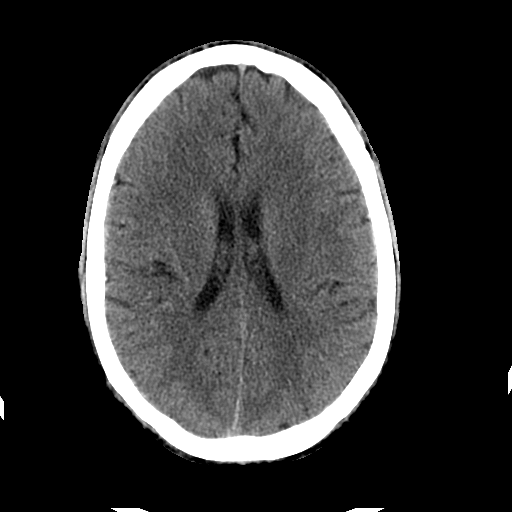
[im 19/30  brain]
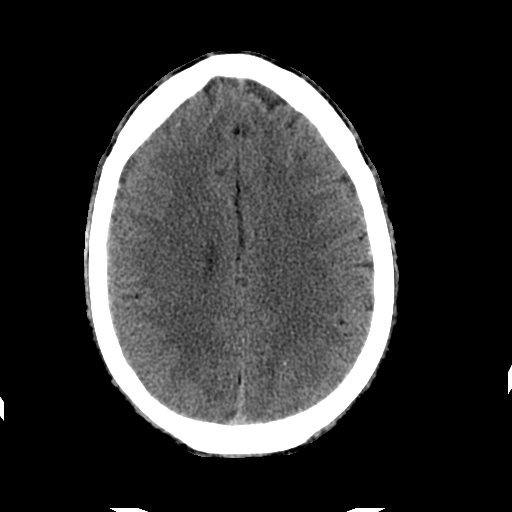
[im 19/30  bone]
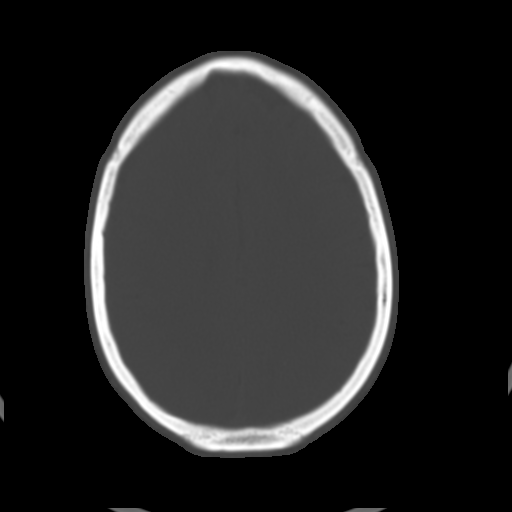
[im 21/30  brain]
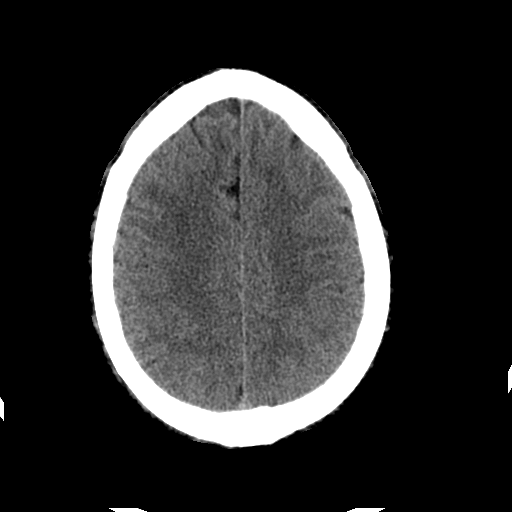
[im 23/30  brain]
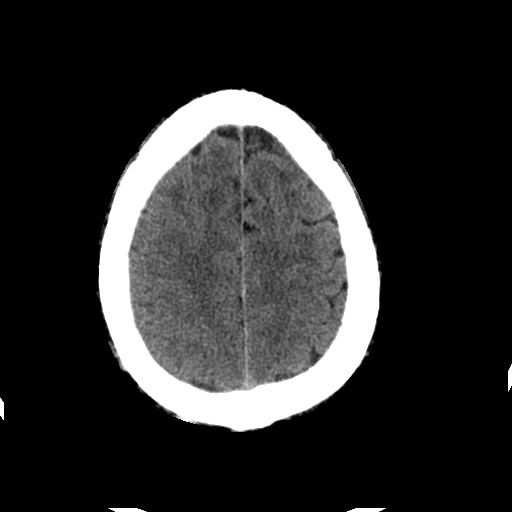
[im 25/30  brain]
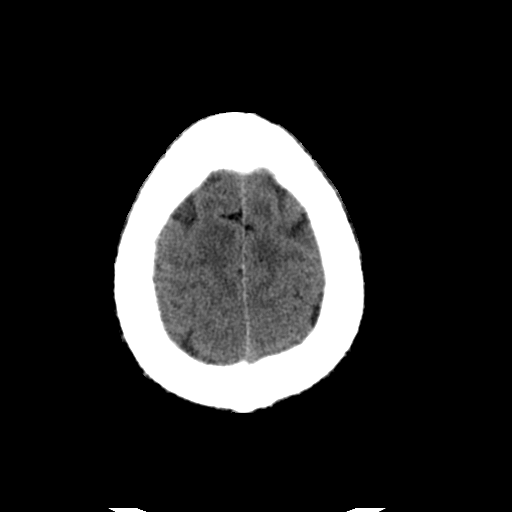
[im 27/30  brain]
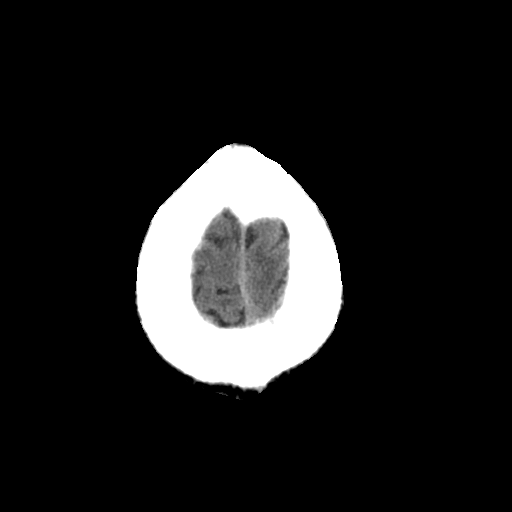
[im 27/30  bone]
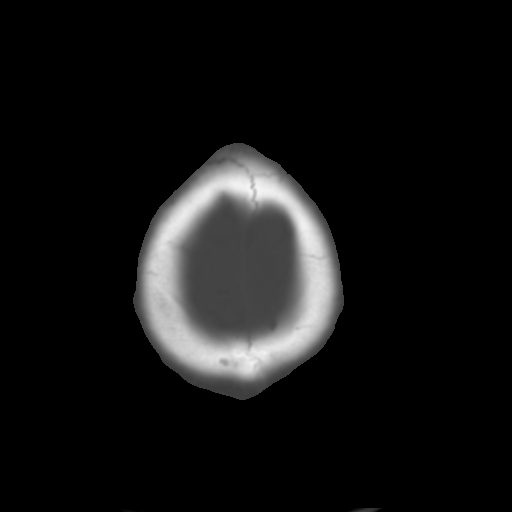

[Series 202: head w/o bone, idose (1) · axial · non-contrast · 0.44mm/px · z∈[+74,+94]mm · 2 of 30 slices shown]
[im 3/30  bone]
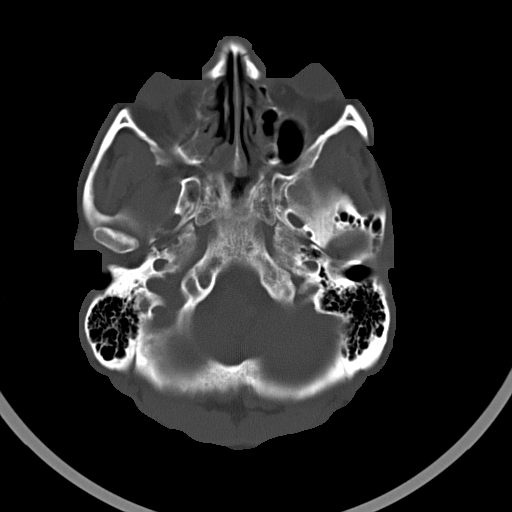
[im 7/30  bone]
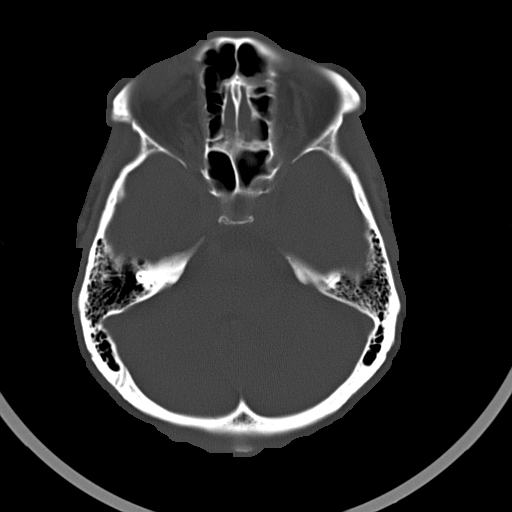

[15 of 30 positions shown; findings below may reference images not displayed]

FINDINGS: There is no evidence of mass effect, midline shift or extra-axial
fluid collections. There is no evidence of a space-occupying lesion
or intracranial hemorrhage. There is no evidence of a cortical-based
area of acute infarction.

The ventricles and sulci are appropriate for the patient's age. The
basal cisterns are patent.

Visualized portions of the orbits are unremarkable. Bilateral
ethmoid sinus mucosal thickening. Complete opacification of the
visualized right maxillary sinus with low attenuation extending into
the right nasal passage which may represent a large mucous retention
cyst which is similar to the prior exams.

The osseous structures are unremarkable.
IMPRESSION: 1.   No acute intracranial pathology.

2. Bilateral ethmoid sinus mucosal thickening. Complete
opacification of the visualized right maxillary sinus with low
attenuation extending into the right nasal passage which may
represent a large mucous retention cyst which is similar to the
prior exams.

## 2016-05-09 IMAGING — CR DG CHEST 2V
2 series · 2 of 2 positions shown · non-contrast
Comparison: PA and lateral chest and CT chest 09/19/2012. PA and
lateral chest 08/08/2013.

CLINICAL DATA: Chest pain and shortness of breath.

EXAM:
CHEST  2 VIEW

[w chest pa]
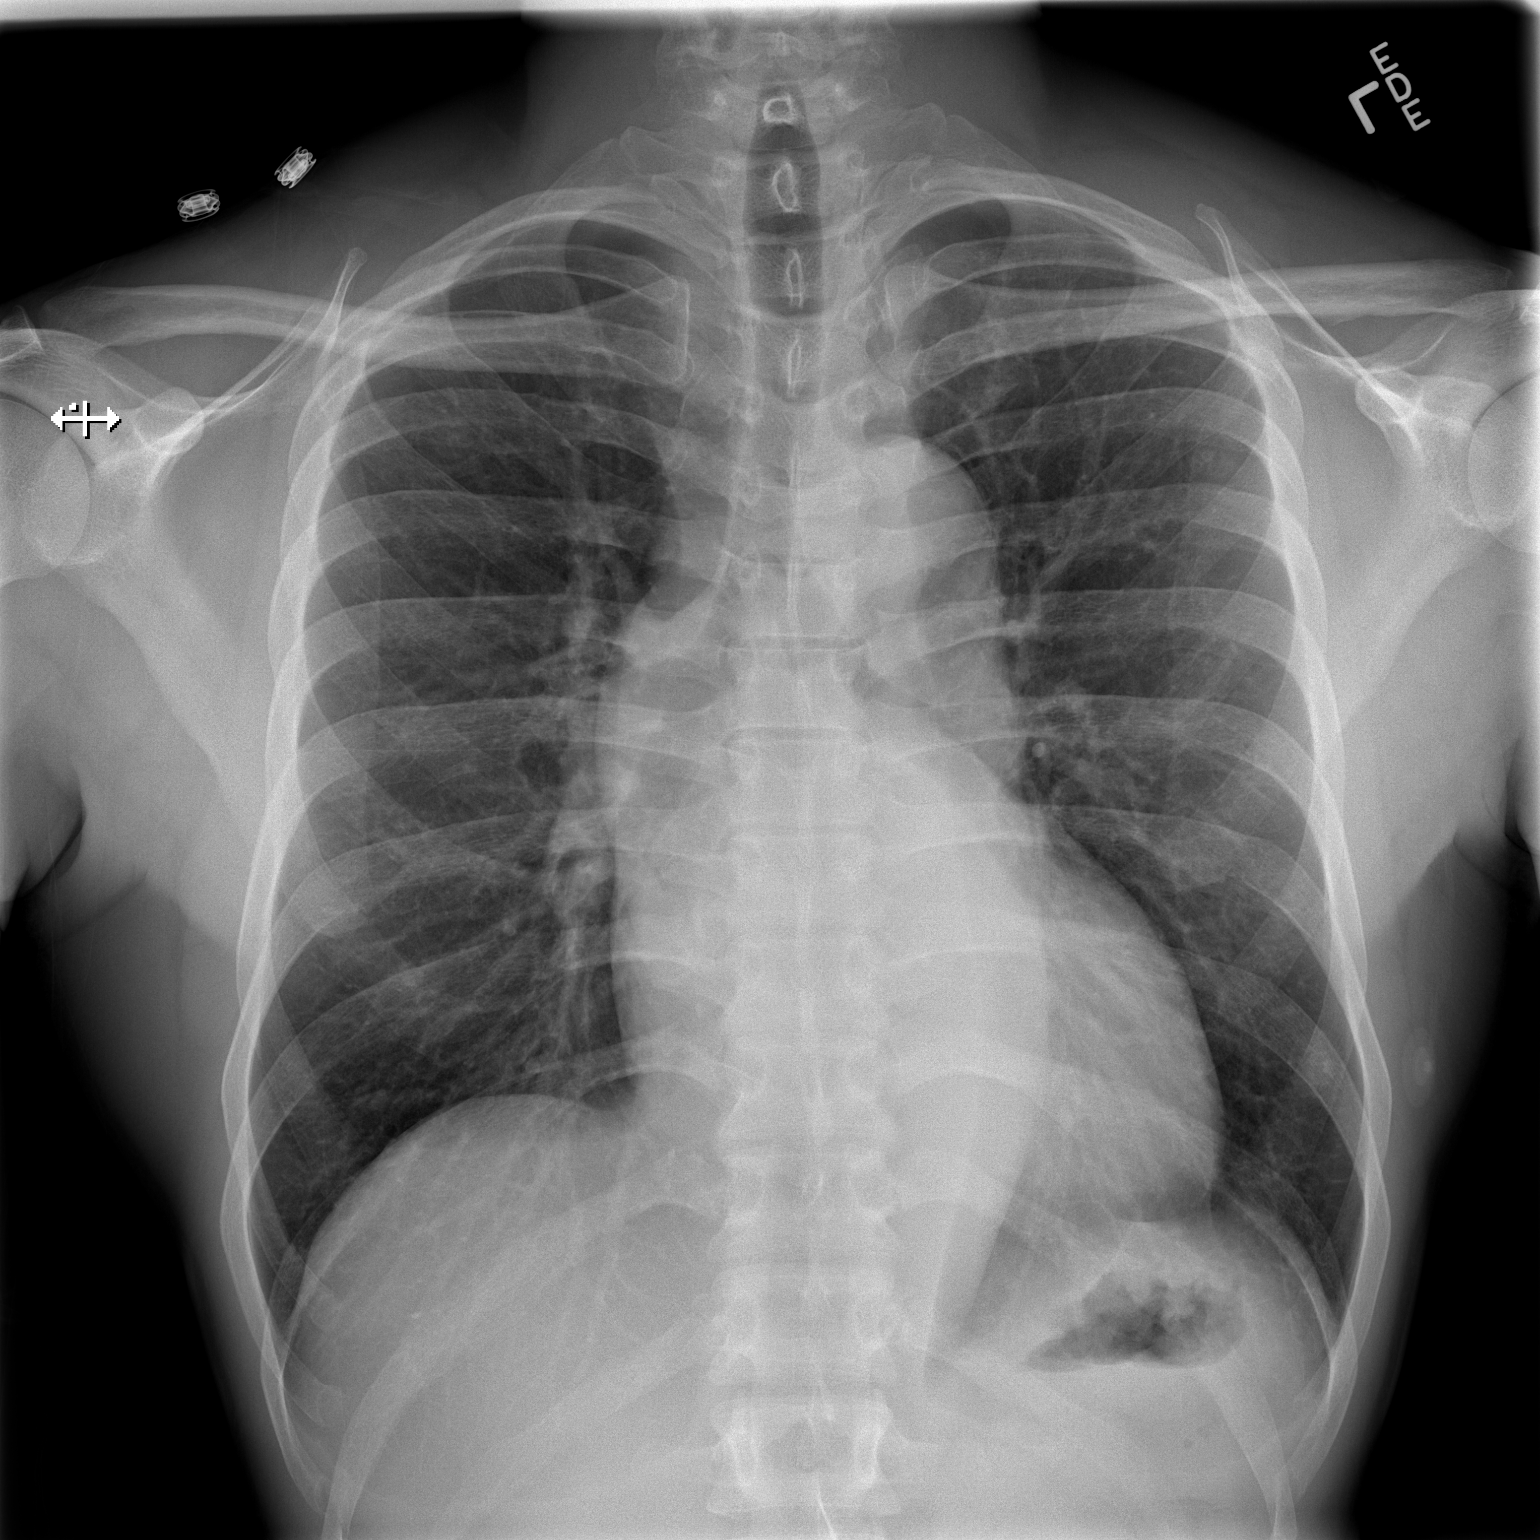

[w chest lat]
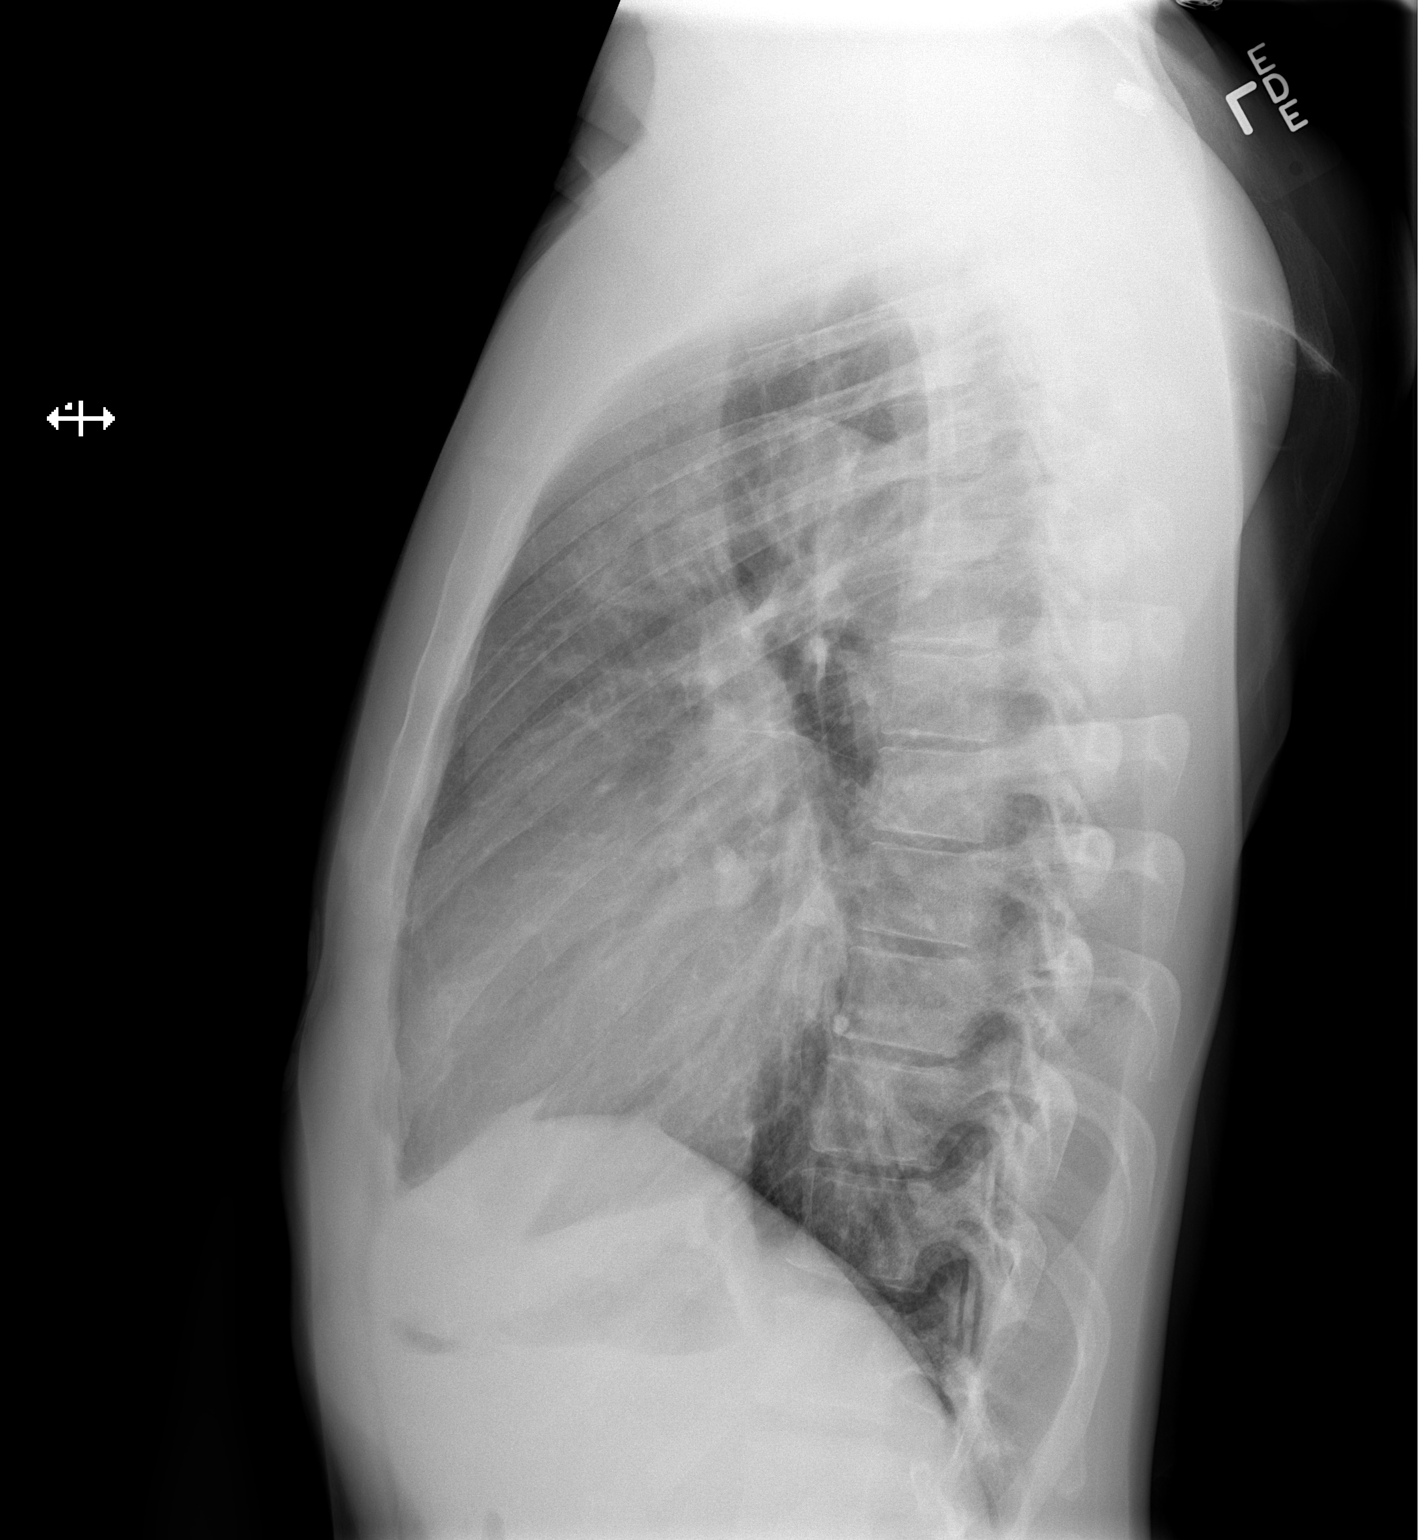

[2 of 2 positions shown; findings below may reference images not displayed]

FINDINGS: There is cardiomegaly without edema. Ectatic aorta is identified.
Lungs are clear. No pneumothorax or pleural effusion.
IMPRESSION: Cardiomegaly without acute disease.  Stable compared to prior exam.

## 2016-05-11 ENCOUNTER — Ambulatory Visit: Payer: Medicare Other | Admitting: Speech Pathology

## 2016-05-11 ENCOUNTER — Encounter: Payer: Medicare Other | Admitting: Occupational Therapy

## 2016-05-12 ENCOUNTER — Telehealth: Payer: Self-pay | Admitting: Physician Assistant

## 2016-05-12 NOTE — Telephone Encounter (Signed)
Late entry: In light of no AF noted on his EM, Dr. Caryl Comes suggested scheduling for ILR.  I called the patient yesterday to re-discuss this further. His wife Scot Jun took the message, stated she recalled the discussion regarding an implantable monitor during his hospital stay at the time of his stroke, she would give the message to her husband to call and discuss further, will schedule if he is agreeable.  Tommye Standard, PA-C

## 2016-05-14 ENCOUNTER — Ambulatory Visit: Payer: Medicare Other | Attending: Internal Medicine | Admitting: Speech Pathology

## 2016-05-14 ENCOUNTER — Encounter: Payer: Medicare Other | Admitting: Occupational Therapy

## 2016-05-14 DIAGNOSIS — R482 Apraxia: Secondary | ICD-10-CM

## 2016-05-14 DIAGNOSIS — R4701 Aphasia: Secondary | ICD-10-CM

## 2016-05-14 NOTE — Therapy (Signed)
[facility] [address] [place], [place], [address] Phone: [phone]   Fax:  [phone]  Speech Language Pathology Treatment  Patient Details  Name: [name] MRN: [id number] Date of Birth: Jul 22, 1970 Referring Provider: Rosalin Hawking MD  Encounter Date: 05/14/2016      End of Session - 05/14/16 1631    Visit Number 4   Number of Visits 17   Date for SLP Re-Evaluation 06/19/16   Authorization Type medicaid secondary   SLP Start Time 1530   SLP Stop Time  1618   SLP Time Calculation (min) 48 min   Activity Tolerance Patient tolerated treatment well      Past Medical History:  Diagnosis Date  . Adenomatous colon polyp 08/2015  . Anxiety   . Aortic disease ([place])   . Aortic dissection ([facility])    a. admx 04/2014 >> L renal infarct; a/c renal failure >> b.  s/p Bioprosthetic [place] and total arch replacement and staged endovascular repair of descending aortic aneurysm ([facility] - Dr. [name])  . CAD (coronary artery disease)    a. LHC 4/16:  oD1 60%  . Cardiomyopathy ([facility])    a. non-ischemic - probably related to untreated HTN and ETOH abuse - Echo 3/13 with EF 35-40% >> b. Echo 4/16: Severe LVH, EF 55-60%, moderate AI, moderate MR, mild LAE, trivial effusion, known type B dissection with communication between true and false lumens with suprasternal images suggesting dissection plane may propagate to at least left subclavian takeoff, root above aortic valve okay    . Chronic abdominal pain   . Chronic combined systolic and diastolic congestive heart failure ([place])    a. 05/2011: Adm with pulm edema/HTN urgency, EF 35-40% with diffuse hypokinesis and moderate to severe mitral regurgitation. Cardiomyopathy likely due to uncontrolled HTN and ETOH abuse - cath deferred due to renal insufficiency (felt due to uncontrolled HTN). bJodie Echevaria MV 06/2011: EF 37% and no ischemia or infarction. c. EF 45-50% by echo 01/2012.  [name] Chronic  sinusitis   . CKD (chronic kidney disease)    a. 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  peak creatinine 3.46 during admx for aortic dissection 2/16.  sees Dr [name]  . Colon polyps 09/11/2015   Descending, sigmoid polyps  . DDD (degenerative disc disease), lumbar   . Depression   . Descending thoracic aortic aneurysm ([place])   . Dissecting aneurysm of thoracic aorta ([place])   . ETOH abuse    a. Reported to have quit 05/2011.  . Frequent headaches   . GERD (gastroesophageal reflux disease)   . Headache(784.0)    "q other day" (08/08/2013)  . Heart murmur   . Hemorrhoid thrombosis   . History of echocardiogram    Echo 1/17:  Severe LVH, EF 55-60%, no RWMA, Gr 2 DD, AVR ok, mild to mod MR, mild LAE, mild reduced RVSF, mod RAE  . History of medication noncompliance   . HYPERLIPIDEMIA   . Hypertension    a. Hx of HTN urgency secondary to noncompliance. b. urinary metanephrine and catecholeamine levels normal 2013.  c. Renal art [name] 1/16:  No evidence of renal artery stenosis noted bilaterally.  . INGUINAL HERNIA   . Pneumonia ~ 2013  . Renal insufficiency   . Stroke ([place])   . Tobacco abuse   . Valvular heart disease    a. Echo 05/2011: moderate to severe eccentric MR and mild to moderate AI with prolapsing left coronary cusp. b. Echo 01/2012: mild-mod AI,  mild dilitation of aortic root, mild MR.;  c. Echo 1/16: Severe LVH consistent with hypertrophic cardio myopathy, EF 50%, no RWMA, mod AI, mild MR, mild RAE, dilated Ao root (40 mm);     Past Surgical History:  Procedure Laterality Date  . ANKLE SURGERY Bilateral    Fractures bilaterally  . AORTIC VALVE SURGERY  09/2014  . FOOT FRACTURE SURGERY Bilateral 2004-2010   "got pins in both of them"  . HEMORRHOID SURGERY N/A 06/15/2015   Procedure: HEMORRHOIDECTOMY;  Surgeon: Stark Klein, MD;  Location: [facility];  Service: General;  Laterality: N/A;  . INGUINAL HERNIA REPAIR Right ~ 1996  . LEFT HEART CATHETERIZATION WITH CORONARY ANGIOGRAM N/A 06/21/2014    Procedure: LEFT HEART CATHETERIZATION WITH CORONARY ANGIOGRAM;  Surgeon: Larey Dresser, MD;  Location: [facility] CATH LAB;  Service: Cardiovascular;  Laterality: N/A;  . TEE WITHOUT CARDIOVERSION N/A 03/12/2016   Procedure: TRANSESOPHAGEAL ECHOCARDIOGRAM (TEE);  Surgeon: Sanda Klein, MD;  Location: [facility] ENDOSCOPY;  Service: Cardiovascular;  Laterality: N/A;    There were no vitals filed for this visit.      Subjective Assessment - 05/14/16 1535    Subjective Patient reports he had a meeting with a landlord this week and noticed he was comunicating more fluidly.               ADULT SLP TREATMENT - 05/14/16 1535      General Information   Behavior/Cognition Alert;Cooperative;Pleasant mood     Treatment Provided   Treatment provided Cognitive-Linquistic     Pain Assessment   Pain Assessment 0-10   Pain Score 6    Pain Location right hip radiating to ankle   Pain Descriptors / Indicators Constant  sharp   Pain Intervention(s) Monitored during session;Premedicated before session     Cognitive-Linquistic Treatment   Treatment focused on Aphasia;Patient/family/caregiver education   Skilled Treatment Patient states he missed recent appointment because he was helping his daughter move from [place] and had his appointment day confused. Informed patient of clinic policy to call in advance if he is unable to make his appointment. Patient states he has been completing home tasks which he did not bring with him to his session today. Targeted written expression with multi-sentence level story completion task. Patient required min-moderate cues to recheck his work and read each word aloud in order to identify and correct errors. Pt wrote 2-sentence responses with 80% accuracy, and self-corrects for 100% accuracy with min cues. Benefits from verbal cues to read sentences aloud to identify errors, primarily omission of function words. Progressed to paragraph-level functional writing task. Patient  wrote an email draft by hand with 80% accuracy, requiring extended processing time and moderate cues including occasional phonemic cues for spelling accuracy. Patient fixated on spelling error and started the task over twice. Required verbal cues to persist in the task. Writing task assigned for home exercise.     Assessment / Recommendations / Plan   Plan Continue with current plan of care     Progression Toward Goals   Progression toward goals Progressing toward goals          SLP Education - 05/14/16 1631    Education provided Yes   Education Details strategies to improve written communication   Person(s) Educated Patient   Methods Explanation;Demonstration   Comprehension Verbalized understanding;Returned demonstration          SLP Short Term Goals - 05/14/16 1633      SLP SHORT TERM GOAL #1   Title  pt will write 4-5 word sentence responses 90% success with occasional min A    Time 3   Period Weeks   Status Achieved     SLP SHORT TERM GOAL #2   Title pt will complete HEP for written language with independence, procedurally   Baseline 2.8   Time 3   Period Weeks   Status On-going     SLP SHORT TERM GOAL #3   Title pt will report, subjectively, incr'd fluidity in his conversational speech compared to pre-ST   Time 3   Period Weeks   Status Achieved     SLP SHORT TERM GOAL #4   Title pt will write 2-4 sentence responses 90% success with occasional min A    Time 4   Period Weeks   Status New          SLP Long Term Goals - 05/14/16 1634      SLP LONG TERM GOAL #1   Title pt will write or text 2-4 sentence functional material with modified independence    Time 7   Period Weeks   Status Revised     SLP LONG TERM GOAL #2   Title pt will report incr'd QOL, due to better written language, than prior to ST course   Time 7   Period Weeks   Status Partially Met          Plan - 05/14/16 1631    Clinical Impression Statement Patient presents with mild  verbal and written expressive language deficits.  With verbal cues, patient continues to improve ability to review and correct written mistakes. Pt will benefit from skilled ST to address expressive aphasia, improve verbal and written communication and QOL.   Speech Therapy Frequency 2x / week   Duration Other (comment)   Treatment/Interventions Language facilitation;Compensatory techniques;Internal/external aids;SLP instruction and feedback;Multimodal communcation approach;Patient/family education;Functional tasks   Potential to Achieve Goals Good   SLP Home Exercise Plan writing exercise   Consulted and Agree with Plan of Care Patient      Patient will benefit from skilled therapeutic intervention in order to improve the following deficits and impairments:   Aphasia  Apraxia    Problem List Patient Active Problem List   Diagnosis Date Noted  . Palpitations   . Cerebrovascular accident (CVA) due to embolism of left middle cerebral artery ([place])   . Malnutrition of moderate degree 02/29/2016  . Seizure ([place]) 02/28/2016  . HLD (hyperlipidemia) 08/28/2015  . Thrombosed hemorrhoids 06/14/2015  . Rectal bleeding 06/14/2015  . Rectal pain 06/14/2015  . 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[place]) 06/07/2015  . Cardiomyopathy ([place]) 03/01/2015  . Acid reflux 03/01/2015  . Essential hypertension 03/01/2015  . Cardiac chest pain 12/10/2014  . Abdominal pain 10/12/2014  . Injury of kidney 10/03/2014  . S/P aortic dissection repair 10/03/2014  . H/O aortic valve replacement 10/03/2014  . Pre-op testing 06/15/2014  . CKD (chronic kidney disease) 05/15/2014  . Hypoxia   . AKI (acute kidney injury) ([place])   . Aortic dissection, thoracoabdominal ([place])   . Dissecting aneurysm of thoracic aorta, Stanford type B ([place]) 04/24/2014  . Aortic dissection, thoracic ([place]) 04/24/2014  . Aortic dissection ([place]) 04/23/2014  . Aneurysm of ascending aorta (HCC) 03/28/2014  . Dyspnea 03/27/2014  .  Prolonged Q-T interval on ECG 03/27/2014  . ETOH abuse 09/20/2013  . Hypertensive crisis 08/08/2013  . Sinusitis, bacterial 08/08/2013  . Malignant hypertension 02/20/2013  . Hypertensive urgency 01/27/2012  . Cardiomyopathy, hypertensive ([place]) 01/26/2012  .  AI (aortic insufficiency) 01/26/2012  . MR (mitral regurgitation) 01/26/2012  . Acute renal failure superimposed on stage 3 chronic kidney disease ([place]) 01/26/2012  . Systolic CHF, chronic ([place]) 06/10/2011  . Chronic bronchitis 05/23/2011  . Hypertensive emergency 05/22/2011  . Cardiomegaly - hypertensive 05/22/2011  . Tobacco abuse 05/22/2011  . Marijuana use 05/22/2011  . Abdominal pain 05/22/2011  . Hypertension, malignant 05/22/2011  . Anxiety and depression 05/22/2011  . Hyperlipidemia LDL goal <70 08/26/2009  . Tobacco use 08/26/2009  . Headache(784.0) 08/26/2009  . Aortic valve disorder 03/11/2009  . INGUINAL HERNIA 02/18/2009  . Uncontrolled hypertension 01/16/2009  . MURMUR 01/16/2009   Deneise Lever, MS CF-SLP Speech-Language Pathologist   Aliene Altes 05/14/2016, 4:36 PM  [facility] [address] [place], [place], [address] Phone: [phone]   Fax:  [phone]   Name: TRAMOND SLINKER MRN: [id number] Date of Birth: 16-Dec-1970

## 2016-05-18 ENCOUNTER — Ambulatory Visit: Payer: Medicare Other | Admitting: Speech Pathology

## 2016-05-19 DIAGNOSIS — Z8679 Personal history of other diseases of the circulatory system: Secondary | ICD-10-CM | POA: Diagnosis not present

## 2016-05-19 DIAGNOSIS — Z9889 Other specified postprocedural states: Secondary | ICD-10-CM | POA: Diagnosis not present

## 2016-05-19 DIAGNOSIS — D649 Anemia, unspecified: Secondary | ICD-10-CM | POA: Diagnosis not present

## 2016-05-19 DIAGNOSIS — I10 Essential (primary) hypertension: Secondary | ICD-10-CM | POA: Diagnosis not present

## 2016-05-19 DIAGNOSIS — N280 Ischemia and infarction of kidney: Secondary | ICD-10-CM | POA: Diagnosis not present

## 2016-05-19 DIAGNOSIS — Z720 Tobacco use: Secondary | ICD-10-CM | POA: Diagnosis not present

## 2016-05-19 DIAGNOSIS — I7100 Dissection of unspecified site of aorta: Secondary | ICD-10-CM | POA: Diagnosis not present

## 2016-05-19 DIAGNOSIS — I639 Cerebral infarction, unspecified: Secondary | ICD-10-CM | POA: Diagnosis not present

## 2016-05-19 DIAGNOSIS — N183 Chronic kidney disease, stage 3 (moderate): Secondary | ICD-10-CM | POA: Diagnosis not present

## 2016-05-21 ENCOUNTER — Ambulatory Visit: Payer: Medicare Other | Admitting: Speech Pathology

## 2016-05-22 ENCOUNTER — Telehealth: Payer: Self-pay | Admitting: Physician Assistant

## 2016-05-22 NOTE — Telephone Encounter (Signed)
Spoke to the patient about pursuing ILR given his EM did not disclose any arrhythmia.  He did in-fact remember our conversation in the hospital, we re-capped this today given his stroke and monitoring for AFib with reports of palpitations as well.  At this time, he would prefer to have a follow up visit with Dr. Caryl Comes to discuss further, and tells me he would like to hold off on the ILR at least for now.  I will ask an office scheduler to give the patient a call to schedule at Dr. Olin Pia next available appointment.  Tommye Standard, PA-C

## 2016-05-25 ENCOUNTER — Encounter: Payer: Medicare Other | Admitting: Speech Pathology

## 2016-05-28 ENCOUNTER — Encounter: Payer: Medicare Other | Admitting: Speech Pathology

## 2016-06-01 ENCOUNTER — Encounter: Payer: Medicare Other | Admitting: Speech Pathology

## 2016-06-02 ENCOUNTER — Ambulatory Visit (INDEPENDENT_AMBULATORY_CARE_PROVIDER_SITE_OTHER): Payer: Medicare Other | Admitting: Neurology

## 2016-06-02 ENCOUNTER — Encounter: Payer: Self-pay | Admitting: Neurology

## 2016-06-02 VITALS — BP 112/72 | HR 72 | Ht 71.0 in | Wt 165.2 lb

## 2016-06-02 DIAGNOSIS — R002 Palpitations: Secondary | ICD-10-CM | POA: Diagnosis not present

## 2016-06-02 DIAGNOSIS — Z8679 Personal history of other diseases of the circulatory system: Secondary | ICD-10-CM | POA: Diagnosis not present

## 2016-06-02 DIAGNOSIS — R569 Unspecified convulsions: Secondary | ICD-10-CM | POA: Diagnosis not present

## 2016-06-02 DIAGNOSIS — I63412 Cerebral infarction due to embolism of left middle cerebral artery: Secondary | ICD-10-CM | POA: Diagnosis not present

## 2016-06-02 DIAGNOSIS — I10 Essential (primary) hypertension: Secondary | ICD-10-CM

## 2016-06-02 NOTE — Patient Instructions (Signed)
-   continue plavix and lipitor for stroke prevention - follow up with Dr. Caryl Comes for loop recorder placement - quit smoking completely - follow up with Dr. [name] in [place] - check BP at home and record - Follow up with your primary care physician for stroke risk factor modification. Recommend maintain blood pressure goal <130/80, diabetes with hemoglobin A1c goal below 7.0% and lipids with LDL cholesterol goal below 70 mg/dL.  - healthy diet and regular exercise - According to [place] law, you can not drive until seizure free for 6 months and under physician's care.  - Please maintain seizure precautions. Do not participate in activities where a loss of awareness could hurt you or someone else.  No swimming alone, no tub bathing, no hot tubs, no driving, no operating motorized vehicles(cars, ATVs, motorbikes, etc), lawnmowers or power tools.  No standing at heights, such as rooftops, ladders or stairs. No sliding boards, monkey bars or swings, or climbing trees.  Avoid hot objects such as stoves, heaters, open fires.  Wear a helmet when riding a bicycle, scooter, skateboard, etc. and avoid areas of traffic.  Set water heater to 120 degrees or less. - follow up in 3-4 months with me

## 2016-06-03 DIAGNOSIS — I63412 Cerebral infarction due to embolism of left middle cerebral artery: Secondary | ICD-10-CM

## 2016-06-03 DIAGNOSIS — Z8679 Personal history of other diseases of the circulatory system: Secondary | ICD-10-CM | POA: Insufficient documentation

## 2016-06-03 HISTORY — DX: Cerebral infarction due to embolism of left middle cerebral artery: I63.412

## 2016-06-03 NOTE — Progress Notes (Signed)
STROKE NEUROLOGY FOLLOW UP NOTE  NAME: [name] DOB: May 07, 1970  REASON FOR VISIT: stroke follow up HISTORY FROM: pt and chart  Today we had the pleasure of seeing [name] in follow-up at our [facility]rology [facility]. Pt was accompanied by no one.   History Summary [name] is a 46 y.o. male with history of aortic dissection s/p repair and reconstruction, systolic and diastolic heart failure, and HTN admitted on 02/28/16 for confusion and right-sided weakness. In ED, had seizure and subsequent cardiac arrest. CT negative for bleeding. CTA without clear LVO, but moderate narrowing of left M2 and right P2. He was not considered for IV TPA due to seizure at onset with non-stroke presentation. Intubated and started on Keppra. Post extubation had mild right hemiparesis and word finding difficulty. MRI showed left MCA embolic stroke. EF 55-60%. Negative DVT. LDL 124 and A1C 5.3. Cardiology recommended outpt TEE and 30 day cardiac event monitoring. If monitoring negative, will consider loop recorder. He was discharged with ASA and lipitor without neuro deficit.  Interval History During the interval time, the patient has been doing well. No recurrent stroke symptoms. Had 30 day cardiac event monitoring did not show afib. Had outpt TEE which was unremarkable. He was scheduled with Dr. Caryl Comes for loop recorder. Pt still feels intermittent palpitation. Cut down smoking but not quit yet.  REVIEW OF SYSTEMS: Full 14 system review of systems performed and notable only for those listed below and in HPI above, all others are negative:  Constitutional:  Appetite change Cardiovascular: murmur Ear/Nose/Throat:  Hearing loss, runny nose Skin:  Eyes:  Eye itching, loss of vision Respiratory:   Gastroitestinal:   Genitourinary:  Hematology/Lymphatic:   Endocrine:  Musculoskeletal:  Back pain Allergy/Immunology:   Neurological:  Memory loss, seizure Psychiatric:  Sleep: restless leg, daytime  sleepiness  The following represents the patient's updated allergies and side effects list: Allergies  Allergen Reactions  . Imdur [Isosorbide Nitrate] Other (See Comments)    headache  . Tramadol Other (See Comments)    "headaches" per pt    The neurologically relevant items on the patient's problem list were reviewed on today's visit.  Neurologic Examination  A problem focused neurological exam (12 or more points of the single system neurologic examination, vital signs counts as 1 point, cranial nerves count for 8 points) was performed.  Blood pressure 112/72, pulse 72, height 5\' 11"  (1.803 m), weight 165 lb 3.2 oz (74.9 kg).  General - Well nourished, well developed, in no apparent distress.  Ophthalmologic - Sharp disc margins OU.  Cardiovascular - Regular rate and rhythm with no murmur.  Mental Status -  Level of arousal and orientation to time, place, and person were intact Language including expression, naming, repetition, comprehension was assessed and found intact. Attention span and concentration were normal. Fund of Knowledge was assessed and was intact.  Cranial Nerves II - XII - II - Visual field intact OU. III, IV, VI - Extraocular movements intact. V - Facial sensation intact bilaterally. VII - Facial movement intact bilaterally. VIII - Hearing & vestibular intact bilaterally. X - Palate elevates symmetrically. XI - Chin turning & shoulder shrug intact bilaterally. XII - Tongue protrusion intact.  Motor Strength - The patient's strength was normal in all extremities and pronator drift was absent.  Bulk was normal and fasciculations were absent.   Motor Tone - Muscle tone was assessed at the neck and appendages and was normal.  Reflexes - The patient's  reflexes were 1+ in all extremities and he had no pathological reflexes.  Sensory - Light touch, temperature/pinprick were assessed and were normal.    Coordination - The patient had normal movements in the  hands and feet with no ataxia or dysmetria.  Tremor was absent.  Gait and Station - The patient's transfers, posture, gait, station, and turns were observed as normal.   Functional score  mRS = 0   0 - No symptoms.   1 - No significant disability. Able to carry out all usual activities, despite some symptoms.   2 - Slight disability. Able to look after own affairs without assistance, but unable to carry out all previous activities.   3 - Moderate disability. Requires some help, but able to walk unassisted.   4 - Moderately severe disability. Unable to attend to own bodily needs without assistance, and unable to walk unassisted.   5 - Severe disability. Requires constant nursing care and attention, bedridden, incontinent.   6 - Dead.   NIH Stroke Scale = 0   Data reviewed: I personally reviewed the images and agree with the radiology interpretations.  Ct Head Code Stroke W/o Cm 02/28/2016 1. No acute intracranial abnormality. Slight worsening of bilateral frontal predominant white matter disease, likely chronic microvascular ischemia. 2. ASPECTS is 10.   Ct Angio Head W Or Wo Contrast Ct Angio Neck W Or Wo Contrast 02/28/2016 1. No intracranial arterial occlusion. 2. Moderate narrowing of the left MCA M2 segments. 3. Moderate to severe narrowing of the right PCA P2 segment. 4. Basilar artery dolichoectasia. 5. Bilateral pulmonary upper lobe consolidation, likely aspiration or pneumonia. 6. Normal CTA of the neck. 7. Aortic atherosclerosis.    Mr Brain Wo Contrast 03/03/2016 1. Large acute left MCA branch infarct. 2. Chronic white matter disease and susceptibility foci, with differential discussion above. 3. Arteriomegaly and vertebrobasilar tortuosity.   2D Echocardiogram  - Left ventricle: The cavity size was normal. Wall thickness wasincreased in a pattern of severe LVH. Systolic function wasnormal. The estimated ejection fraction was in the range of 55%to 60%. Wall  motion was normal; there were no regional wallmotion abnormalities. Features are consistent with a pseudonormalleft ventricular filling pattern, with concomitant abnormalrelaxation and increased filling pressure (grade 2 diastolicdysfunction). - Aortic valve: A bioprosthesis was present and functioningnormally. Valve area (VTI): 1.74 cm^2. Valve area (Vmax): 1.56cm^2. Valve area (Vmean): 1.69 cm^2. - Aorta: The proximal aorta is dilated. In one view what appears tobe a disseection flap is seen more distally . Suggest TEE iffurther eval needed. - Mitral valve: There was mild to moderate regurgitation. - Left atrium: The atrium was moderately dilated.  LE venous doppler - No evidence of deep vein thrombosis or baker's cysts bilaterally.  TEE - Left ventricle: The cavity size was normal. Wall thickness was   increased in a pattern of severe LVH. There was concentric   hypertrophy. Systolic function was normal. The estimated ejection   fraction was in the range of 55% to 60%. Wall motion was normal;   there were no regional wall motion abnormalities. - Aortic valve: A bioprosthesis was present and functioning   normally. No evidence of vegetation. - Aorta: S/P ascending aorta graft repair. The sinuses are   preserved and appear to be at the upper limit of normal in size.   The proximal graft suture line is very prominen, but appears   intact. The aortic arch (graft replacement) is not well seen due   to graft shadowing. There is  minimal residual false lumen in the   proximal descending thoracic aorta. In the distal descending   thoracic aorta there is a sizeable false lumen (up to 2.4 cm   thickness), but it appears to be fully thrombosed. - Mitral valve: No evidence of vegetation. There was mild   regurgitation directed centrally. - Left atrium: The atrium was mildly to moderately dilated. No   evidence of thrombus in the atrial cavity or appendage. No   spontaneous echo contrast was  observed. - Right atrium: No evidence of thrombus in the atrial cavity or   appendage. No evidence of thrombus in the atrial cavity or   appendage. - Atrial septum: No defect or patent foramen ovale was identified. - Tricuspid valve: No evidence of vegetation. - Pulmonic valve: No evidence of vegetation.  30 day cardiac event monitoring - no afib  Component     Latest Ref Rng & Units 03/04/2016  Cholesterol     0 - 200 mg/dL 178  Triglycerides     <150 mg/dL 119  HDL Cholesterol     >40 mg/dL 30 (L)  Total CHOL/HDL Ratio     RATIO 5.9  VLDL     0 - 40 mg/dL 24  LDL (calc)     0 - 99 mg/dL 124 (H)  Hemoglobin A1C     4.8 - 5.6 % 5.3  Mean Plasma Glucose     mg/dL 105    Assessment: As you may recall, he is a 46 y.o. African American male with PMH of aortic dissection s/p repair and reconstruction, systolic and diastolic heart failure, and HTN admitted on 02/28/16 for confusion and right-sided weakness. In ED, had seizure and subsequent cardiac arrest. CT negative for bleeding. CTA without clear LVO, but moderate narrowing of left M2 and right P2. MRI showed left MCA embolic stroke. EF 55-60%. Negative DVT. LDL 124 and A1C 5.3. He was discharged with ASA and lipitor and keppra without neuro deficit. Had 30 day cardiac event monitoring did not show afib. Had outpt TEE which was unremarkable. He was scheduled with Dr. Caryl Comes for loop recorder. Pt still feels intermittent palpitation. Cut down smoking but not quit yet.  Plan:  - continue plavix and lipitor for stroke prevention - follow up with Dr. Caryl Comes for loop recorder placement - quit smoking completely - follow up with Dr. [name] in [place] - check BP at home and record - Follow up with your primary care physician for stroke risk factor modification. Recommend maintain blood pressure goal <130/80, diabetes with hemoglobin A1c goal below 7.0% and lipids with LDL cholesterol goal below 70 mg/dL.  - healthy diet and regular  exercise - According to [place] law, you can not drive until seizure free for 6 months and under physician's care.  - Please maintain seizure precautions.  - follow up in 3-4 months with me  I spent more than 25 minutes of face to face time with the patient. Greater than 50% of time was spent in counseling and coordination of care. We discussed cardiology follow up for heart monitoring, follow up with [facility], no driving and seizure precautions.   No orders of the defined types were placed in this encounter.   Meds ordered this encounter  Medications  . DISCONTD: DULoxetine (CYMBALTA) 30 MG capsule    Sig: Take by mouth.  [name] atorvastatin (LIPITOR) 20 MG tablet    Sig: Take by mouth.  . DISCONTD: cloNIDine (CATAPRES) 0.3 MG tablet    Sig:  Take by mouth.  . DISCONTD: pantoprazole (PROTONIX) 20 MG tablet    Sig: Take by mouth.  [name] omeprazole (PRILOSEC) 20 MG capsule    Sig: Take by mouth.  . DISCONTD: labetalol (NORMODYNE) 200 MG tablet    Sig: Take by mouth.  . DISCONTD: hydrALAZINE (APRESOLINE) 100 MG tablet    Sig: Take by mouth.    Patient Instructions  - continue plavix and lipitor for stroke prevention - follow up with Dr. Caryl Comes for loop recorder placement - quit smoking completely - follow up with Dr. [name] in [place] - check BP at home and record - Follow up with your primary care physician for stroke risk factor modification. Recommend maintain blood pressure goal <130/80, diabetes with hemoglobin A1c goal below 7.0% and lipids with LDL cholesterol goal below 70 mg/dL.  - healthy diet and regular exercise - According to [place] law, you can not drive until seizure free for 6 months and under physician's care.  - Please maintain seizure precautions. Do not participate in activities where a loss of awareness could hurt you or someone else.  No swimming alone, no tub bathing, no hot tubs, no driving, no operating motorized vehicles(cars, ATVs, motorbikes, etc), lawnmowers or power tools.   No standing at heights, such as rooftops, ladders or stairs. No sliding boards, monkey bars or swings, or climbing trees.  Avoid hot objects such as stoves, heaters, open fires.  Wear a helmet when riding a bicycle, scooter, skateboard, etc. and avoid areas of traffic.  Set water heater to 120 degrees or less. - follow up in 3-4 months with me     Rosalin Hawking, MD PhD [facility] [address] [place], [place] [address] [phone]

## 2016-06-12 ENCOUNTER — Encounter: Payer: Self-pay | Admitting: Internal Medicine

## 2016-06-12 ENCOUNTER — Ambulatory Visit (INDEPENDENT_AMBULATORY_CARE_PROVIDER_SITE_OTHER): Payer: Medicare Other | Admitting: Internal Medicine

## 2016-06-12 VITALS — BP 160/90 | HR 68 | Ht 71.0 in | Wt 162.0 lb

## 2016-06-12 DIAGNOSIS — I639 Cerebral infarction, unspecified: Secondary | ICD-10-CM | POA: Diagnosis not present

## 2016-06-12 MED ORDER — CLONIDINE HCL 0.3 MG/24HR TD PTWK: 0.3000 mg | MEDICATED_PATCH | TRANSDERMAL | 12 refills | Status: DC

## 2016-06-12 NOTE — Patient Instructions (Signed)
Medication Instructions: - Your physician has recommended you make the following change in your medication: 1) Norvasc (amlodipine) 10 mg - take one tablet at BEDTIME 2) Stop clonidine tablets 3) Start clonidine 0.3 mg/24 hours- apply one patch once a week  Labwork: - none ordered  Procedures/Testing: - Your physician has recommended that you have a loop recorder (LINQ) monitor placed  - You are scheduled for Friday 06/19/16 with Dr. Caryl Comes - Arrive at the [facility], Entrance "A" of [facility] at 6:30 am - You may have a light breakfast the morning of your procedure - You may have take all of your regular medications with enough water to get them down safely  Follow-Up: - Your physician recommends that you schedule a follow-up appointment in: about 10-14 days (from 06/19/16) with the device clinic for a wound check  Any Additional Special Instructions Will Be Listed Below (If Applicable).     If you need a refill on your cardiac medications before your next appointment, please call your pharmacy.

## 2016-06-12 NOTE — Progress Notes (Signed)
ELECTROPHYSIOLOGY CONSULT NOTE  Patient ID: [name], MRN: [id number], DOB/AGE: 1970/08/07 46 y.o. Admit date: (Not on file) Date of Consult: 06/13/2016  Primary Physician: [name], MD Primary Cardiologist: DM Consulting Physician SWeaver  Chief Complaint:Cryptogenic Stroke    HPI [name] is a 46 y.o. male  Referred for consideration of a loop recorder.  He has a history of poorly controlled hypertension with cardiovascular consequences including severe LVH and a history of an aortic dissection requiring a stent graft surgery with a bioprosthetic aortic valve>>>.  In 09/2014 he underwent aortic valved conduit root replacement with coronary reconstruction (button Bentall procedure) with #27 mm Sorin Mitroflow bovine pericardial vlaved conduit and total arch replacement with individual head replacement by Dr. [name]. He returned to the OR several days later for staged endovascular repair of the descending thoracic aortic aneurysm with intentional L subclavian artery coverage <<<   He is followed closely by A Powell for A/C renal insufficiency   Echo 12/17  LVEF 55-60% LAE ( 48/2.5/42)  Still with residual weakness of right hand, with some neglect.  No chest pain, mild exertional shortness of breath. No edema or PND  Fatigue is huge issue and wonders whether related to meds  30 recorder Personally reviewed  Without atrial fib  Past Medical History:  Diagnosis Date  . Adenomatous colon polyp 08/2015  . Anxiety   . Aortic disease ([place])   . Aortic dissection ([facility])    a. admx 04/2014 >> L renal infarct; a/c renal failure >> b.  s/p Bioprosthetic Bentall and total arch replacement and staged endovascular repair of descending aortic aneurysm ([facility] - Dr. [name])  . CAD (coronary artery disease)    a. LHC 4/16:  oD1 60%  . Cardiomyopathy ([place])    a. non-ischemic - probably related to untreated HTN and ETOH abuse - Echo 3/13 with EF 35-40% >> b. Echo 4/16: Severe  LVH, EF 55-60%, moderate AI, moderate MR, mild LAE, trivial effusion, known type B dissection with communication between true and false lumens with suprasternal images suggesting dissection plane may propagate to at least left subclavian takeoff, root above aortic valve okay    . Chronic abdominal pain   . Chronic combined systolic and diastolic congestive heart failure ([place])    a. 05/2011: Adm with pulm edema/HTN urgency, EF 35-40% with diffuse hypokinesis and moderate to severe mitral regurgitation. Cardiomyopathy likely due to uncontrolled HTN and ETOH abuse - cath deferred due to renal insufficiency (felt due to uncontrolled HTN). bJodie Echevaria MV 06/2011: EF 37% and no ischemia or infarction. c. EF 45-50% by echo 01/2012.  [name] Chronic sinusitis   . CKD (chronic kidney disease)    a. 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  peak creatinine 3.46 during admx for aortic dissection 2/16.  sees Dr [name]  . Colon polyps 09/11/2015   Descending, sigmoid polyps  . DDD (degenerative disc disease), lumbar   . Depression   . Descending thoracic aortic aneurysm ([place])   . Dissecting aneurysm of thoracic aorta ([place])   . ETOH abuse    a. Reported to have quit 05/2011.  . Frequent headaches   . GERD (gastroesophageal reflux disease)   . Headache(784.0)    "q other day" (08/08/2013)  . Heart murmur   . Hemorrhoid thrombosis   . History of echocardiogram    Echo 1/17:  Severe LVH, EF 55-60%, no RWMA, Gr 2 DD, AVR ok, mild to mod MR, mild LAE, mild reduced RVSF, mod  RAE  . History of medication noncompliance   . HYPERLIPIDEMIA   . Hypertension    a. Hx of HTN urgency secondary to noncompliance. b. urinary metanephrine and catecholeamine levels normal 2013.  c. Renal art [name] 1/16:  No evidence of renal artery stenosis noted bilaterally.  . INGUINAL HERNIA   . Pneumonia ~ 2013  . Renal insufficiency   . Stroke ([place])   . Tobacco abuse   . Valvular heart disease    a. Echo 05/2011: moderate to severe eccentric MR and  mild to moderate AI with prolapsing left coronary cusp. b. Echo 01/2012: mild-mod AI, mild dilitation of aortic root, mild MR.;  c. Echo 1/16: Severe LVH consistent with hypertrophic cardio myopathy, EF 50%, no RWMA, mod AI, mild MR, mild RAE, dilated Ao root (40 mm);       Surgical History:  Past Surgical History:  Procedure Laterality Date  . ANKLE SURGERY Bilateral    Fractures bilaterally  . AORTIC VALVE SURGERY  09/2014  . FOOT FRACTURE SURGERY Bilateral 2004-2010   "got pins in both of them"  . HEMORRHOID SURGERY N/A 06/15/2015   Procedure: HEMORRHOIDECTOMY;  Surgeon: Stark Ashelynn Marks, MD;  Location: [facility];  Service: General;  Laterality: N/A;  . INGUINAL HERNIA REPAIR Right ~ 1996  . LEFT HEART CATHETERIZATION WITH CORONARY ANGIOGRAM N/A 06/21/2014   Procedure: LEFT HEART CATHETERIZATION WITH CORONARY ANGIOGRAM;  Surgeon: Larey Dresser, MD;  Location: [facility] CATH LAB;  Service: Cardiovascular;  Laterality: N/A;  . TEE WITHOUT CARDIOVERSION N/A 03/12/2016   Procedure: TRANSESOPHAGEAL ECHOCARDIOGRAM (TEE);  Surgeon: Sanda Hong Timm, MD;  Location[facility] ENDOSCOPY;  Service: Cardiovascular;  Laterality: N/A;     Home Meds: Prior to Admission medications   Medication Sig Start Date End Date Taking? Authorizing Provider  acetaminophen (TYLENOL) 500 MG tablet Take 1,000 mg by mouth 3 (three) times daily.   Yes Historical Provider, MD  amLODipine (NORVASC) 10 MG tablet Take 10 mg by mouth daily.   Yes Historical Provider, MD  atorvastatin (LIPITOR) 20 MG tablet Take 1 tablet (20 mg total) by mouth daily. 03/18/16  Yes Pixie Casino, MD  atorvastatin (LIPITOR) 20 MG tablet Take by mouth. 02/07/15  Yes Historical Provider, MD  cloNIDine (CATAPRES) 0.2 MG tablet Take 1 tablet (0.2 mg total) by mouth 3 (three) times daily. Patient taking differently: Take 0.2 mg by mouth 2 (two) times daily.  09/09/15  Yes Marijean Heath, NP  clopidogrel (PLAVIX) 75 MG tablet Take 1 tablet (75 mg total) by mouth  daily. 03/07/16  Yes Verlee Monte, MD  DULoxetine (CYMBALTA) 30 MG capsule Take 30 mg by mouth daily.   Yes Historical Provider, MD  hydrALAZINE (APRESOLINE) 50 MG tablet Take 1 tablet (50 mg total) by mouth 2 (two) times daily. 05/01/16  Yes Golden Circle, FNP  labetalol (NORMODYNE) 300 MG tablet Take 2 tablets (600 mg total) by mouth 2 (two) times daily. Patient taking differently: Take 300 mg by mouth 2 (two) times daily.  12/14/14  Yes Scott T Kathlen Mody, PA-C  levETIRAcetam (KEPPRA) 500 MG tablet Take 1 tablet (500 mg total) by mouth 2 (two) times daily. 03/06/16  Yes Verlee Monte, MD  lidocaine (LIDODERM) 5 % Place 1 patch onto the skin daily. Remove & Discard patch within 12 hours or as directed by MD   Yes Historical Provider, MD  pantoprazole (PROTONIX) 40 MG tablet Take 1 tablet (40 mg total) by mouth daily. 05/01/16  Yes Golden Circle, FNP  Allergies:  Allergies  Allergen Reactions  . Imdur [Isosorbide Nitrate] Other (See Comments)    headache  . Tramadol Other (See Comments)    "headaches" per pt    Social History   Social History  . Marital status: Married    Spouse name: N/A  . Number of children: 5  . Years of education: 12   Occupational History  . Disabled, part-time Programmer, systems     Disability   Social History Main Topics  . Smoking status: Current Some Day Smoker    Packs/day: 0.25    Years: 23.00    Types: Cigarettes  . Smokeless tobacco: Never Used     Comment: 3 to 4 per day  . Alcohol use 0.6 oz/week    1 Cans of beer per week     Comment: occasionally  . Drug use: Yes    Types: Marijuana     Comment: 08/08/2013 "twice a month"  last time 06/13/15  . Sexual activity: Yes   Other Topics Concern  . Not on file   Social History Narrative   Fun: Enjoy his children     Family History  Problem Relation Age of Onset  . Hypertension Mother   . Hypertension    . Colon cancer Paternal Uncle   . Stroke Maternal Aunt   . Heart attack Brother     . Diabetes Maternal Aunt   . Lung cancer Maternal Uncle   . Throat cancer Neg Hx   . Pancreatic cancer Neg Hx   . Esophageal cancer Neg Hx   . Kidney disease Neg Hx   . Liver disease Neg Hx      ROS:  Please see the history of present illness.    All other systems reviewed and negative.    Physical Exam:  Blood pressure (!) 160/90, pulse 68, height 5\' 11"  (1.803 m), weight 162 lb (73.5 kg), SpO2 98 %. General: Well developed, well nourished male in no acute distress. Head: Normocephalic, atraumatic, sclera non-icteric, no xanthomas, nares are without discharge. EENT: normal  Lymph Nodes:  none Neck: Negative for carotid bruits. JVD not elevated. Back:without scoliosis kyphosis Lungs: Clear bilaterally to auscultation without wheezes, rales, or rhonchi. Breathing is unlabored. Heart: RRR with S1 K8.1/2 systolic  murmur . No rubs, or gallops appreciated. Abdomen: Soft, non-tender, non-distended with normoactive bowel sounds. No hepatomegaly. No rebound/guarding. No obvious abdominal masses. Msk:  Strength and tone appear normal for age. Extremities: No clubbing or cyanosis. No  edema.  Distal pedal pulses are 2+ and equal bilaterally. Skin: Warm and Dry Neuro: Alert and oriented X 3. CN III-XII intact Grossly normal sensory and motor function . R hand weakness  Psych:  Responds to questions appropriately with a normal affect.      Labs: Cardiac Enzymes No results for input(s): CKTOTAL, CKMB, TROPONINI in the last 72 hours. CBC Lab Results  Component Value Date   WBC 5.4 03/06/2016   HGB 11.8 (L) 03/06/2016   HCT 35.9 (L) 03/06/2016   MCV 92.1 03/06/2016   PLT 118 (L) 03/06/2016   PROTIME: No results for input(s): LABPROT, INR in the last 72 hours. Chemistry No results for input(s): NA, K, CL, CO2, BUN, CREATININE, CALCIUM, PROT, BILITOT, ALKPHOS, ALT, AST, GLUCOSE in the last 168 hours.  Invalid input(s): LABALBU Lipids Lab Results  Component Value Date   CHOL 178  03/04/2016   HDL 30 (L) 03/04/2016   LDLCALC 124 (H) 03/04/2016   TRIG 119 03/04/2016  BNP Pro B Natriuretic peptide (BNP)  Date/Time Value Ref Range Status  09/11/2013 10:20 AM 1,981.0 (H) 0 - 125 pg/mL Final  08/08/2013 11:45 AM 1,031.0 (H) 0 - 125 pg/mL Final  05/05/2013 03:06 PM 1,915.0 (H) 0 - 125 pg/mL Final  02/20/2013 11:30 AM 795.2 (H) 0 - 125 pg/mL Final   Thyroid Function Tests: No results for input(s): TSH, T4TOTAL, T3FREE, THYROIDAB in the last 72 hours.  Invalid input(s): FREET3 Miscellaneous Lab Results  Component Value Date   DDIMER >20.00 (H) 03/03/2016    Radiology/Studies:  No results found.  EKG:  Sinus with LVH  ECG personally reviewed   Assessment and Plan Cryptogenic Stroke  Hypertension refractory  S/P Aortic dissection  Renal Insufficiency  L Atrial enlargent  Hypertensive heart disease   Pts event recorder jnegative    Reasonable for LINQ to look for AFib;  He has moderate LAE and is modest risk of AFib  Spoken to  Dr AP re antihypertensives, and will change clonidine to TTS-3 and consider aldactone        [name]

## 2016-06-19 ENCOUNTER — Ambulatory Visit (HOSPITAL_COMMUNITY)
Admission: RE | Admit: 2016-06-19 | Discharge: 2016-06-19 | Disposition: A | Discharge status: Home / Self Care | Payer: Medicare Other | Source: Ambulatory Visit | Attending: Internal Medicine | Admitting: Internal Medicine

## 2016-06-19 ENCOUNTER — Encounter (HOSPITAL_COMMUNITY)
Admission: RE | Disposition: A | Discharge status: Home / Self Care | Payer: Self-pay | Source: Ambulatory Visit | Attending: Internal Medicine

## 2016-06-19 ENCOUNTER — Encounter (HOSPITAL_COMMUNITY): Payer: Self-pay | Admitting: *Deleted

## 2016-06-19 DIAGNOSIS — I429 Cardiomyopathy, unspecified: Secondary | ICD-10-CM | POA: Diagnosis not present

## 2016-06-19 DIAGNOSIS — F419 Anxiety disorder, unspecified: Secondary | ICD-10-CM | POA: Insufficient documentation

## 2016-06-19 DIAGNOSIS — F1290 Cannabis use, unspecified, uncomplicated: Secondary | ICD-10-CM | POA: Insufficient documentation

## 2016-06-19 DIAGNOSIS — I639 Cerebral infarction, unspecified: Secondary | ICD-10-CM | POA: Diagnosis not present

## 2016-06-19 DIAGNOSIS — I340 Nonrheumatic mitral (valve) insufficiency: Secondary | ICD-10-CM | POA: Insufficient documentation

## 2016-06-19 DIAGNOSIS — I130 Hypertensive heart and chronic kidney disease with heart failure and stage 1 through stage 4 chronic kidney disease, or unspecified chronic kidney disease: Secondary | ICD-10-CM | POA: Diagnosis not present

## 2016-06-19 DIAGNOSIS — F329 Major depressive disorder, single episode, unspecified: Secondary | ICD-10-CM | POA: Diagnosis not present

## 2016-06-19 DIAGNOSIS — I63412 Cerebral infarction due to embolism of left middle cerebral artery: Secondary | ICD-10-CM | POA: Diagnosis present

## 2016-06-19 DIAGNOSIS — Z9114 Patient's other noncompliance with medication regimen: Secondary | ICD-10-CM | POA: Insufficient documentation

## 2016-06-19 DIAGNOSIS — Z823 Family history of stroke: Secondary | ICD-10-CM | POA: Insufficient documentation

## 2016-06-19 DIAGNOSIS — Z7902 Long term (current) use of antithrombotics/antiplatelets: Secondary | ICD-10-CM | POA: Insufficient documentation

## 2016-06-19 DIAGNOSIS — N189 Chronic kidney disease, unspecified: Secondary | ICD-10-CM | POA: Insufficient documentation

## 2016-06-19 DIAGNOSIS — J329 Chronic sinusitis, unspecified: Secondary | ICD-10-CM | POA: Insufficient documentation

## 2016-06-19 DIAGNOSIS — I638 Other cerebral infarction: Secondary | ICD-10-CM | POA: Insufficient documentation

## 2016-06-19 DIAGNOSIS — K219 Gastro-esophageal reflux disease without esophagitis: Secondary | ICD-10-CM | POA: Insufficient documentation

## 2016-06-19 DIAGNOSIS — G8929 Other chronic pain: Secondary | ICD-10-CM | POA: Diagnosis not present

## 2016-06-19 DIAGNOSIS — I5042 Chronic combined systolic (congestive) and diastolic (congestive) heart failure: Secondary | ICD-10-CM | POA: Diagnosis not present

## 2016-06-19 DIAGNOSIS — M5136 Other intervertebral disc degeneration, lumbar region: Secondary | ICD-10-CM | POA: Insufficient documentation

## 2016-06-19 DIAGNOSIS — I2510 Atherosclerotic heart disease of native coronary artery without angina pectoris: Secondary | ICD-10-CM | POA: Insufficient documentation

## 2016-06-19 DIAGNOSIS — Z8249 Family history of ischemic heart disease and other diseases of the circulatory system: Secondary | ICD-10-CM | POA: Insufficient documentation

## 2016-06-19 DIAGNOSIS — R109 Unspecified abdominal pain: Secondary | ICD-10-CM | POA: Insufficient documentation

## 2016-06-19 DIAGNOSIS — F17210 Nicotine dependence, cigarettes, uncomplicated: Secondary | ICD-10-CM | POA: Insufficient documentation

## 2016-06-19 DIAGNOSIS — R531 Weakness: Secondary | ICD-10-CM | POA: Diagnosis not present

## 2016-06-19 DIAGNOSIS — E785 Hyperlipidemia, unspecified: Secondary | ICD-10-CM | POA: Diagnosis not present

## 2016-06-19 HISTORY — PX: LOOP RECORDER INSERTION: EP1214

## 2016-06-19 SURGERY — LOOP RECORDER INSERTION
Anesthesia: LOCAL

## 2016-06-19 MED ORDER — LIDOCAINE HCL (PF) 1 % IJ SOLN
INTRAMUSCULAR | Status: DC | PRN
  Administered 2016-06-19: 5 mL

## 2016-06-19 MED ORDER — LIDOCAINE HCL (PF) 1 % IJ SOLN
INTRAMUSCULAR | Status: AC
  Filled 2016-06-19: qty 30

## 2016-06-19 SURGICAL SUPPLY — 2 items
LOOP REVEAL LINQSYS (Prosthesis & Implant Heart) ×1 IMPLANT
PACK LOOP INSERTION (CUSTOM PROCEDURE TRAY) ×2 IMPLANT

## 2016-06-19 NOTE — Discharge Instructions (Signed)
See hand out.

## 2016-06-19 NOTE — Interval H&P Note (Signed)
History and Physical Interval Note:  06/19/2016 7:19 AM  [name]  has presented today for surgery, with the diagnosis of stroke  The various methods of treatment have been discussed with the patient and family. After consideration of risks, benefits and other options for treatment, the patient has consented to  Procedure(s): Loop Recorder Insertion (N/A) as a surgical intervention .  The patient's history has been reviewed, patient examined, no change in status, stable for surgery.  I have reviewed the patient's chart and labs.  Questions were answered to the patient's satisfaction.     [name]

## 2016-06-19 NOTE — H&P (View-Only) (Signed)
ELECTROPHYSIOLOGY CONSULT NOTE  Patient ID: [name], MRN: [id number], DOB/AGE: 44-Nov-1972 44 y.o. Admit date: (Not on file) Date of Consult: 06/13/2016  Primary Physician: [name], MD Primary Cardiologist: DM Consulting Physician SWeaver  Chief Complaint:Cryptogenic Stroke    HPI [name] is a 46 y.o. male  Referred for consideration of a loop recorder.  He has a history of poorly controlled hypertension with cardiovascular consequences including severe LVH and a history of an aortic dissection requiring a stent graft surgery with a bioprosthetic aortic valve>>>.  In 09/2014 he underwent aortic valved conduit root replacement with coronary reconstruction (button Bentall procedure) with #27 mm Sorin Mitroflow bovine pericardial vlaved conduit and total arch replacement with individual head replacement by Dr. [name]. He returned to the OR several days later for staged endovascular repair of the descending thoracic aortic aneurysm with intentional L subclavian artery coverage <<<   He is followed closely by A Powell for A/C renal insufficiency   Echo 12/17  LVEF 55-60% LAE ( 48/2.5/42)  Still with residual weakness of right hand, with some neglect.  No chest pain, mild exertional shortness of breath. No edema or PND  Fatigue is huge issue and wonders whether related to meds  30 recorder Personally reviewed  Without atrial fib  Past Medical History:  Diagnosis Date  . Adenomatous colon polyp 08/2015  . Anxiety   . Aortic disease ([place])   . Aortic dissection (H[facility])    a. admx 04/2014 >> L renal infarct; a/c renal failure >> b.  s/p Bioprosthetic Bentall and total arch replacement and staged endovascular repair of descending aortic aneurysm ([facility] - Dr. [name])  . CAD (coronary artery disease)    a. LHC 4/16:  oD1 60%  . Cardiomyopathy ([place])    a. non-ischemic - probably related to untreated HTN and ETOH abuse - Echo 3/13 with EF 35-40% >> b. Echo 4/16: Severe  LVH, EF 55-60%, moderate AI, moderate MR, mild LAE, trivial effusion, known type B dissection with communication between true and false lumens with suprasternal images suggesting dissection plane may propagate to at least left subclavian takeoff, root above aortic valve okay    . Chronic abdominal pain   . Chronic combined systolic and diastolic congestive heart failure ([place])    a. 05/2011: Adm with pulm edema/HTN urgency, EF 35-40% with diffuse hypokinesis and moderate to severe mitral regurgitation. Cardiomyopathy likely due to uncontrolled HTN and ETOH abuse - cath deferred due to renal insufficiency (felt due to uncontrolled HTN). bJodie Echevaria MV 06/2011: EF 37% and no ischemia or infarction. c. EF 45-50% by echo 01/2012.  [name] Chronic sinusitis   . CKD (chronic kidney disease)    a. 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  peak creatinine 3.46 during admx for aortic dissection 2/16.  sees Dr [name]  . Colon polyps 09/11/2015   Descending, sigmoid polyps  . DDD (degenerative disc disease), lumbar   . Depression   . Descending thoracic aortic aneurysm ([place])   . Dissecting aneurysm of thoracic aorta ([place])   . ETOH abuse    a. Reported to have quit 05/2011.  . Frequent headaches   . GERD (gastroesophageal reflux disease)   . Headache(784.0)    "q other day" (08/08/2013)  . Heart murmur   . Hemorrhoid thrombosis   . History of echocardiogram    Echo 1/17:  Severe LVH, EF 55-60%, no RWMA, Gr 2 DD, AVR ok, mild to mod MR, mild LAE, mild reduced RVSF, mod  RAE  . History of medication noncompliance   . HYPERLIPIDEMIA   . Hypertension    a. Hx of HTN urgency secondary to noncompliance. b. urinary metanephrine and catecholeamine levels normal 2013.  c. Renal art [name] 1/16:  No evidence of renal artery stenosis noted bilaterally.  . INGUINAL HERNIA   . Pneumonia ~ 2013  . Renal insufficiency   . Stroke ([place])   . Tobacco abuse   . Valvular heart disease    a. Echo 05/2011: moderate to severe eccentric MR and  mild to moderate AI with prolapsing left coronary cusp. b. Echo 01/2012: mild-mod AI, mild dilitation of aortic root, mild MR.;  c. Echo 1/16: Severe LVH consistent with hypertrophic cardio myopathy, EF 50%, no RWMA, mod AI, mild MR, mild RAE, dilated Ao root (40 mm);       Surgical History:  Past Surgical History:  Procedure Laterality Date  . ANKLE SURGERY Bilateral    Fractures bilaterally  . AORTIC VALVE SURGERY  09/2014  . FOOT FRACTURE SURGERY Bilateral 2004-2010   "got pins in both of them"  . HEMORRHOID SURGERY N/A 06/15/2015   Procedure: HEMORRHOIDECTOMY;  Surgeon: Stark Klein, MD;  Location: [facility];  Service: General;  Laterality: N/A;  . INGUINAL HERNIA REPAIR Right ~ 1996  . LEFT HEART CATHETERIZATION WITH CORONARY ANGIOGRAM N/A 06/21/2014   Procedure: LEFT HEART CATHETERIZATION WITH CORONARY ANGIOGRAM;  Surgeon: Larey Dresser, MD;  Location: [facility] CATH LAB;  Service: Cardiovascular;  Laterality: N/A;  . TEE WITHOUT CARDIOVERSION N/A 03/12/2016   Procedure: TRANSESOPHAGEAL ECHOCARDIOGRAM (TEE);  Surgeon: Sanda Klein, MD;  Location: [facility] ENDOSCOPY;  Service: Cardiovascular;  Laterality: N/A;     Home Meds: Prior to Admission medications   Medication Sig Start Date End Date Taking? Authorizing Provider  acetaminophen (TYLENOL) 500 MG tablet Take 1,000 mg by mouth 3 (three) times daily.   Yes Historical Provider, MD  amLODipine (NORVASC) 10 MG tablet Take 10 mg by mouth daily.   Yes Historical Provider, MD  atorvastatin (LIPITOR) 20 MG tablet Take 1 tablet (20 mg total) by mouth daily. 03/18/16  Yes Pixie Casino, MD  atorvastatin (LIPITOR) 20 MG tablet Take by mouth. 02/07/15  Yes Historical Provider, MD  cloNIDine (CATAPRES) 0.2 MG tablet Take 1 tablet (0.2 mg total) by mouth 3 (three) times daily. Patient taking differently: Take 0.2 mg by mouth 2 (two) times daily.  09/09/15  Yes Marijean Heath, NP  clopidogrel (PLAVIX) 75 MG tablet Take 1 tablet (75 mg total) by mouth  daily. 03/07/16  Yes Verlee Monte, MD  DULoxetine (CYMBALTA) 30 MG capsule Take 30 mg by mouth daily.   Yes Historical Provider, MD  hydrALAZINE (APRESOLINE) 50 MG tablet Take 1 tablet (50 mg total) by mouth 2 (two) times daily. 05/01/16  Yes Golden Circle, FNP  labetalol (NORMODYNE) 300 MG tablet Take 2 tablets (600 mg total) by mouth 2 (two) times daily. Patient taking differently: Take 300 mg by mouth 2 (two) times daily.  12/14/14  Yes Scott T Kathlen Mody, PA-C  levETIRAcetam (KEPPRA) 500 MG tablet Take 1 tablet (500 mg total) by mouth 2 (two) times daily. 03/06/16  Yes Verlee Monte, MD  lidocaine (LIDODERM) 5 % Place 1 patch onto the skin daily. Remove & Discard patch within 12 hours or as directed by MD   Yes Historical Provider, MD  pantoprazole (PROTONIX) 40 MG tablet Take 1 tablet (40 mg total) by mouth daily. 05/01/16  Yes Golden Circle, FNP  Allergies:  Allergies  Allergen Reactions  . Imdur [Isosorbide Nitrate] Other (See Comments)    headache  . Tramadol Other (See Comments)    "headaches" per pt    Social History   Social History  . Marital status: Married    Spouse name: N/A  . Number of children: 5  . Years of education: 12   Occupational History  . Disabled, part-time Programmer, systems     Disability   Social History Main Topics  . Smoking status: Current Some Day Smoker    Packs/day: 0.25    Years: 23.00    Types: Cigarettes  . Smokeless tobacco: Never Used     Comment: 3 to 4 per day  . Alcohol use 0.6 oz/week    1 Cans of beer per week     Comment: occasionally  . Drug use: Yes    Types: Marijuana     Comment: 08/08/2013 "twice a month"  last time 06/13/15  . Sexual activity: Yes   Other Topics Concern  . Not on file   Social History Narrative   Fun: Enjoy his children     Family History  Problem Relation Age of Onset  . Hypertension Mother   . Hypertension    . Colon cancer Paternal Uncle   . Stroke Maternal Aunt   . Heart attack Brother     . Diabetes Maternal Aunt   . Lung cancer Maternal Uncle   . Throat cancer Neg Hx   . Pancreatic cancer Neg Hx   . Esophageal cancer Neg Hx   . Kidney disease Neg Hx   . Liver disease Neg Hx      ROS:  Please see the history of present illness.    All other systems reviewed and negative.    Physical Exam:  Blood pressure (!) 160/90, pulse 68, height 5\' 11"  (1.803 m), weight 162 lb (73.5 kg), SpO2 98 %. General: Well developed, well nourished male in no acute distress. Head: Normocephalic, atraumatic, sclera non-icteric, no xanthomas, nares are without discharge. EENT: normal  Lymph Nodes:  none Neck: Negative for carotid bruits. JVD not elevated. Back:without scoliosis kyphosis Lungs: Clear bilaterally to auscultation without wheezes, rales, or rhonchi. Breathing is unlabored. Heart: RRR with S1 S2.8/3 systolic  murmur . No rubs, or gallops appreciated. Abdomen: Soft, non-tender, non-distended with normoactive bowel sounds. No hepatomegaly. No rebound/guarding. No obvious abdominal masses. Msk:  Strength and tone appear normal for age. Extremities: No clubbing or cyanosis. No  edema.  Distal pedal pulses are 2+ and equal bilaterally. Skin: Warm and Dry Neuro: Alert and oriented X 3. CN III-XII intact Grossly normal sensory and motor function . R hand weakness  Psych:  Responds to questions appropriately with a normal affect.      Labs: Cardiac Enzymes No results for input(s): CKTOTAL, CKMB, TROPONINI in the last 72 hours. CBC Lab Results  Component Value Date   WBC 5.4 03/06/2016   HGB 11.8 (L) 03/06/2016   HCT 35.9 (L) 03/06/2016   MCV 92.1 03/06/2016   PLT 118 (L) 03/06/2016   PROTIME: No results for input(s): LABPROT, INR in the last 72 hours. Chemistry No results for input(s): NA, K, CL, CO2, BUN, CREATININE, CALCIUM, PROT, BILITOT, ALKPHOS, ALT, AST, GLUCOSE in the last 168 hours.  Invalid input(s): LABALBU Lipids Lab Results  Component Value Date   CHOL 178  03/04/2016   HDL 30 (L) 03/04/2016   LDLCALC 124 (H) 03/04/2016   TRIG 119 03/04/2016  BNP Pro B Natriuretic peptide (BNP)  Date/Time Value Ref Range Status  09/11/2013 10:20 AM 1,981.0 (H) 0 - 125 pg/mL Final  08/08/2013 11:45 AM 1,031.0 (H) 0 - 125 pg/mL Final  05/05/2013 03:06 PM 1,915.0 (H) 0 - 125 pg/mL Final  02/20/2013 11:30 AM 795.2 (H) 0 - 125 pg/mL Final   Thyroid Function Tests: No results for input(s): TSH, T4TOTAL, T3FREE, THYROIDAB in the last 72 hours.  Invalid input(s): FREET3 Miscellaneous Lab Results  Component Value Date   DDIMER >20.00 (H) 03/03/2016    Radiology/Studies:  No results found.  EKG:  Sinus with LVH  ECG personally reviewed   Assessment and Plan Cryptogenic Stroke  Hypertension refractory  S/P Aortic dissection  Renal Insufficiency  L Atrial enlargent  Hypertensive heart disease   Pts event recorder jnegative    Reasonable for LINQ to look for AFib;  He has moderate LAE and is modest risk of AFib  Spoken to  Dr AP re antihypertensives, and will change clonidine to TTS-3 and consider aldactone        [name]

## 2016-07-01 ENCOUNTER — Ambulatory Visit (INDEPENDENT_AMBULATORY_CARE_PROVIDER_SITE_OTHER): Payer: Self-pay | Admitting: *Deleted

## 2016-07-01 DIAGNOSIS — I639 Cerebral infarction, unspecified: Secondary | ICD-10-CM

## 2016-07-01 DIAGNOSIS — Z4509 Encounter for adjustment and management of other cardiac device: Secondary | ICD-10-CM

## 2016-07-01 LAB — CUP PACEART INCLINIC DEVICE CHECK
Date Time Interrogation Session: 20180418095131
MDC IDC PG IMPLANT DT: 20180406
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
## 2016-07-20 ENCOUNTER — Ambulatory Visit (INDEPENDENT_AMBULATORY_CARE_PROVIDER_SITE_OTHER): Payer: Medicare Other | Admitting: *Deleted

## 2016-07-20 DIAGNOSIS — I639 Cerebral infarction, unspecified: Secondary | ICD-10-CM

## 2016-07-21 NOTE — Progress Notes (Signed)
[facility] Summary Report / Loop Recorder 

## 2016-07-29 ENCOUNTER — Ambulatory Visit: Payer: Medicare Other | Admitting: Family

## 2016-08-03 LAB — CUP PACEART REMOTE DEVICE CHECK
Date Time Interrogation Session: 20180506173916
MDC IDC PG IMPLANT DT: 20180406

## 2016-08-11 ENCOUNTER — Encounter (HOSPITAL_COMMUNITY): Payer: Self-pay

## 2016-08-11 ENCOUNTER — Emergency Department (HOSPITAL_COMMUNITY): Payer: Medicare Other

## 2016-08-11 ENCOUNTER — Emergency Department (HOSPITAL_COMMUNITY)
Admission: EM | Admit: 2016-08-11 | Discharge: 2016-08-11 | Disposition: A | Discharge status: Home / Self Care | Payer: Medicare Other | Attending: Emergency Medicine | Admitting: Emergency Medicine

## 2016-08-11 DIAGNOSIS — I10 Essential (primary) hypertension: Secondary | ICD-10-CM | POA: Diagnosis not present

## 2016-08-11 DIAGNOSIS — R079 Chest pain, unspecified: Secondary | ICD-10-CM | POA: Diagnosis present

## 2016-08-11 DIAGNOSIS — K219 Gastro-esophageal reflux disease without esophagitis: Secondary | ICD-10-CM | POA: Diagnosis not present

## 2016-08-11 DIAGNOSIS — Z79899 Other long term (current) drug therapy: Secondary | ICD-10-CM | POA: Diagnosis not present

## 2016-08-11 DIAGNOSIS — Z720 Tobacco use: Secondary | ICD-10-CM

## 2016-08-11 DIAGNOSIS — I2510 Atherosclerotic heart disease of native coronary artery without angina pectoris: Secondary | ICD-10-CM | POA: Diagnosis not present

## 2016-08-11 DIAGNOSIS — N189 Chronic kidney disease, unspecified: Secondary | ICD-10-CM | POA: Diagnosis not present

## 2016-08-11 DIAGNOSIS — I5042 Chronic combined systolic (congestive) and diastolic (congestive) heart failure: Secondary | ICD-10-CM | POA: Insufficient documentation

## 2016-08-11 DIAGNOSIS — I130 Hypertensive heart and chronic kidney disease with heart failure and stage 1 through stage 4 chronic kidney disease, or unspecified chronic kidney disease: Secondary | ICD-10-CM | POA: Insufficient documentation

## 2016-08-11 DIAGNOSIS — R0789 Other chest pain: Secondary | ICD-10-CM | POA: Diagnosis not present

## 2016-08-11 DIAGNOSIS — F17210 Nicotine dependence, cigarettes, uncomplicated: Secondary | ICD-10-CM | POA: Insufficient documentation

## 2016-08-11 DIAGNOSIS — R109 Unspecified abdominal pain: Secondary | ICD-10-CM | POA: Diagnosis not present

## 2016-08-11 LAB — URINALYSIS, ROUTINE W REFLEX MICROSCOPIC
BACTERIA UA: NONE SEEN
BILIRUBIN URINE: NEGATIVE
Glucose, UA: NEGATIVE mg/dL
KETONES UR: NEGATIVE mg/dL
LEUKOCYTES UA: NEGATIVE
NITRITE: NEGATIVE
PROTEIN: 100 mg/dL — AB
SQUAMOUS EPITHELIAL / LPF: NONE SEEN
Specific Gravity, Urine: 1.021 (ref 1.005–1.030)
pH: 5 (ref 5.0–8.0)

## 2016-08-11 LAB — CBC
HCT: 38.9 % — ABNORMAL LOW (ref 39.0–52.0)
HEMOGLOBIN: 12.8 g/dL — AB (ref 13.0–17.0)
MCH: 31.2 pg (ref 26.0–34.0)
MCHC: 32.9 g/dL (ref 30.0–36.0)
MCV: 94.9 fL (ref 78.0–100.0)
Platelets: 129 10*3/uL — ABNORMAL LOW (ref 150–400)
RBC: 4.1 MIL/uL — AB (ref 4.22–5.81)
RDW: 14 % (ref 11.5–15.5)
WBC: 6.6 10*3/uL (ref 4.0–10.5)

## 2016-08-11 LAB — BASIC METABOLIC PANEL
Anion gap: 7 (ref 5–15)
BUN: 20 mg/dL (ref 6–20)
CHLORIDE: 107 mmol/L (ref 101–111)
CO2: 22 mmol/L (ref 22–32)
CREATININE: 1.56 mg/dL — AB (ref 0.61–1.24)
Calcium: 9.5 mg/dL (ref 8.9–10.3)
GFR calc Af Amer: >60 mL/min (ref >60)
GFR calc non Af Amer: 52 mL/min — ABNORMAL LOW (ref >60)
GLUCOSE: 87 mg/dL (ref 65–99)
POTASSIUM: 3.8 mmol/L (ref 3.5–5.1)
SODIUM: 136 mmol/L (ref 135–145)

## 2016-08-11 LAB — RAPID URINE DRUG SCREEN, HOSP PERFORMED
Amphetamines: NOT DETECTED
BARBITURATES: NOT DETECTED
BENZODIAZEPINES: NOT DETECTED
COCAINE: NOT DETECTED
OPIATES: POSITIVE — AB
Tetrahydrocannabinol: POSITIVE — AB

## 2016-08-11 LAB — I-STAT TROPONIN, ED
TROPONIN I, POC: 0.03 ng/mL (ref 0.00–0.08)
Troponin i, poc: 0.02 ng/mL (ref 0.00–0.08)

## 2016-08-11 LAB — LIPASE, BLOOD: LIPASE: 25 U/L (ref 11–51)

## 2016-08-11 LAB — ETHANOL: Alcohol, Ethyl (B): <5 mg/dL (ref <5)

## 2016-08-11 MED ORDER — ONDANSETRON HCL 4 MG/2ML IJ SOLN
4.0000 mg | Freq: Once | INTRAMUSCULAR | Status: AC
  Administered 2016-08-11: 4 mg via INTRAVENOUS
  Filled 2016-08-11: qty 2

## 2016-08-11 MED ORDER — HYDROCODONE-ACETAMINOPHEN 5-325 MG PO TABS: 1.0000 | ORAL_TABLET | ORAL | 0 refills | Status: DC | PRN

## 2016-08-11 MED ORDER — HYDROMORPHONE HCL 1 MG/ML IJ SOLN
1.0000 mg | Freq: Once | INTRAMUSCULAR | Status: AC
  Administered 2016-08-11: 1 mg via INTRAVENOUS
  Filled 2016-08-11: qty 1

## 2016-08-11 MED ORDER — HYDRALAZINE HCL 20 MG/ML IJ SOLN
20.0000 mg | Freq: Once | INTRAMUSCULAR | Status: AC
  Administered 2016-08-11: 20 mg via INTRAVENOUS
  Filled 2016-08-11: qty 1

## 2016-08-11 MED ORDER — LABETALOL HCL 5 MG/ML IV SOLN
20.0000 mg | Freq: Once | INTRAVENOUS | Status: AC
  Administered 2016-08-11: 20 mg via INTRAVENOUS
  Filled 2016-08-11: qty 4

## 2016-08-11 MED ORDER — MORPHINE SULFATE (PF) 4 MG/ML IV SOLN
4.0000 mg | Freq: Once | INTRAVENOUS | Status: AC
  Administered 2016-08-11: 4 mg via INTRAVENOUS
  Filled 2016-08-11: qty 1

## 2016-08-11 MED ORDER — IOPAMIDOL (ISOVUE-370) INJECTION 76%
INTRAVENOUS | Status: AC
  Administered 2016-08-11: 100 mL
  Filled 2016-08-11: qty 100

## 2016-08-11 MED ORDER — FAMOTIDINE IN NACL 20-0.9 MG/50ML-% IV SOLN
20.0000 mg | Freq: Once | INTRAVENOUS | Status: AC
  Administered 2016-08-11: 20 mg via INTRAVENOUS
  Filled 2016-08-11: qty 50

## 2016-08-11 MED ORDER — SODIUM CHLORIDE 0.9 % IV BOLUS (SEPSIS)
1000.0000 mL | Freq: Once | INTRAVENOUS | Status: AC
  Administered 2016-08-11: 1000 mL via INTRAVENOUS

## 2016-08-11 MED ORDER — FAMOTIDINE 20 MG PO TABS: 20.0000 mg | ORAL_TABLET | Freq: Two times a day (BID) | ORAL | 0 refills | Status: DC

## 2016-08-11 NOTE — Discharge Instructions (Signed)
Stop smoking

## 2016-08-11 NOTE — ED Notes (Signed)
To CT at this time.

## 2016-08-11 NOTE — ED Provider Notes (Signed)
[facility] DEPT Provider Note   CSN: [id number] Arrival date & time: 08/11/16  1406     History   Chief Complaint Chief Complaint  Patient presents with  . Chest Pain    HPI [name] is a 46 y.o. male.  Pt presents to the ED today with left sided CP.  He has a complicated past medical hx which includes CAD, CM, Aortic dissection, CHF, CRI, HTN.  Pt said it has been going on for a few days.  He also has associated sob.  He said the pain is worse when he lays on his left side.  Pt is hypertensive upon arrival.  He did not take his meds today.  He said his chest burns when he takes them which makes his pain worse.  Pt did have a loop recorder placed about 1 month ago.  He did not take anything for pain at home.  He does continue to smoke.        Past Medical History:  Diagnosis Date  . Adenomatous colon polyp 08/2015  . Anxiety   . Aortic disease ([place])   . Aortic dissection (HCC)    a. admx 04/2014 >> L renal infarct; a/c renal failure >> b.  s/p Bioprosthetic [place] and total arch replacement and staged endovascular repair of descending aortic aneurysm ([facility] - Dr. [name])  . CAD (coronary artery disease)    a. LHC 4/16:  oD1 60%  . Cardiomyopathy ([place])    a. non-ischemic - probably related to untreated HTN and ETOH abuse - Echo 3/13 with EF 35-40% >> b. Echo 4/16: Severe LVH, EF 55-60%, moderate AI, moderate MR, mild LAE, trivial effusion, known type B dissection with communication between true and false lumens with suprasternal images suggesting dissection plane may propagate to at least left subclavian takeoff, root above aortic valve okay    . Chronic abdominal pain   . Chronic combined systolic and diastolic congestive heart failure ([place])    a. 05/2011: Adm with pulm edema/HTN urgency, EF 35-40% with diffuse hypokinesis and moderate to severe mitral regurgitation. Cardiomyopathy likely due to uncontrolled HTN and ETOH abuse - cath deferred due to renal insufficiency  (felt due to uncontrolled HTN). bJodie Echevaria MV 06/2011: EF 37% and no ischemia or infarction. c. EF 45-50% by echo 01/2012.  [name] Chronic sinusitis   . CKD (chronic kidney disease)    a. 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  peak creatinine 3.46 during admx for aortic dissection 2/16.  sees Dr [name]  . Colon polyps 09/11/2015   Descending, sigmoid polyps  . DDD (degenerative disc disease), lumbar   . Depression   . Descending thoracic aortic aneurysm ([place])   . Dissecting aneurysm of thoracic aorta ([place])   . ETOH abuse    a. Reported to have quit 05/2011.  . Frequent headaches   . GERD (gastroesophageal reflux disease)   . Headache(784.0)    "q other day" (08/08/2013)  . Heart murmur   . Hemorrhoid thrombosis   . History of echocardiogram    Echo 1/17:  Severe LVH, EF 55-60%, no RWMA, Gr 2 DD, AVR ok, mild to mod MR, mild LAE, mild reduced RVSF, mod RAE  . History of medication noncompliance   . HYPERLIPIDEMIA   . Hypertension    a. Hx of HTN urgency secondary to noncompliance. b. urinary metanephrine and catecholeamine levels normal 2013.  c. Renal art [name] 1/16:  No evidence of renal artery stenosis noted bilaterally.  . INGUINAL HERNIA   .  Pneumonia ~ 2013  . Renal insufficiency   . Stroke ([place])   . Tobacco abuse   . Valvular heart disease    a. Echo 05/2011: moderate to severe eccentric MR and mild to moderate AI with prolapsing left coronary cusp. b. Echo 01/2012: mild-mod AI, mild dilitation of aortic root, mild MR.;  c. Echo 1/16: Severe LVH consistent with hypertrophic cardio myopathy, EF 50%, no RWMA, mod AI, mild MR, mild RAE, dilated Ao root (40 mm);     Patient Active Problem List   Diagnosis Date Noted  . Cerebral infarction due to embolism of left middle cerebral artery ([place]) 06/03/2016  . History of aortic dissection 06/03/2016  . Palpitation   . Cerebrovascular accident (CVA) due to embolism of left middle cerebral artery ([place])   . Seizures ([place]) 02/28/2016  . HLD  (hyperlipidemia) 08/28/2015  . 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[place]) 06/07/2015  . Cardiomyopathy ([place]) 03/01/2015  . Acid reflux 03/01/2015  . Essential hypertension 03/01/2015  . Cardiac chest pain 12/10/2014  . Abdominal pain 10/12/2014  . S/P aortic dissection repair 10/03/2014  . H/O aortic valve replacement 10/03/2014  . CKD (chronic kidney disease) 05/15/2014  . AKI (acute kidney injury) ([place])   . Aortic dissection, thoracoabdominal ([place])   . Dissecting aneurysm of thoracic aorta, Stanford type B ([place]) 04/24/2014  . Aortic dissection, thoracic ([place]) 04/24/2014  . Aortic dissection ([place]) 04/23/2014  . Aneurysm of ascending aorta (HCC) 03/28/2014  . Dyspnea 03/27/2014  . ETOH abuse 09/20/2013  . Malignant hypertension 02/20/2013  . Hypertensive urgency 01/27/2012  . Cardiomyopathy, hypertensive ([place]) 01/26/2012  . AI (aortic insufficiency) 01/26/2012  . MR (mitral regurgitation) 01/26/2012  . Acute renal failure superimposed on stage 3 chronic kidney disease ([place]) 01/26/2012  . Systolic CHF, chronic ([place]) 06/10/2011  . Chronic bronchitis 05/23/2011  . Cardiomegaly - hypertensive 05/22/2011  . Marijuana use 05/22/2011  . Abdominal pain 05/22/2011  . Hypertension, malignant 05/22/2011  . Anxiety and depression 05/22/2011  . Hyperlipidemia LDL goal <70 08/26/2009  . Tobacco use 08/26/2009  . Headache(784.0) 08/26/2009  . Aortic valve disorder 03/11/2009  . INGUINAL HERNIA 02/18/2009  . Uncontrolled hypertension 01/16/2009    Past Surgical History:  Procedure Laterality Date  . ANKLE SURGERY Bilateral    Fractures bilaterally  . AORTIC VALVE SURGERY  09/2014  . FOOT FRACTURE SURGERY Bilateral 2004-2010   "got pins in both of them"  . HEMORRHOID SURGERY N/A 06/15/2015   Procedure: HEMORRHOIDECTOMY;  Surgeon: Stark Klein, MD;  Location: [facility];  Service: General;  Laterality: N/A;  . INGUINAL HERNIA REPAIR Right ~ 1996  . LEFT HEART CATHETERIZATION WITH CORONARY  ANGIOGRAM N/A 06/21/2014   Procedure: LEFT HEART CATHETERIZATION WITH CORONARY ANGIOGRAM;  Surgeon: Larey Dresser, MD;  Location[facility] CATH LAB;  Service: Cardiovascular;  Laterality: N/A;  . LOOP RECORDER INSERTION N/A 06/19/2016   Procedure: Loop Recorder Insertion;  Surgeon: Deboraha Sprang, MD;  Location: [facility] CV LAB;  Service: Cardiovascular;  Laterality: N/A;  . TEE WITHOUT CARDIOVERSION N/A 03/12/2016   Procedure: TRANSESOPHAGEAL ECHOCARDIOGRAM (TEE);  Surgeon: Sanda Klein, MD;  Location: [facility] ENDOSCOPY;  Service: Cardiovascular;  Laterality: N/A;       Home Medications    Prior to Admission medications   Medication Sig Start Date End Date Taking? Authorizing Provider  acetaminophen (TYLENOL) 500 MG tablet Take 1,000 mg by mouth every 8 (eight) hours as needed for mild pain or moderate pain.    Yes [provider]  amLODipine (  NORVASC) 10 MG tablet Take 10 mg by mouth at bedtime.   Yes [provider]  cloNIDine (CATAPRES - DOSED IN MG/24 HR) 0.3 mg/24hr patch Place 1 patch (0.3 mg total) onto the skin once a week. 06/12/16  Yes Deboraha Sprang, MD  clopidogrel (PLAVIX) 75 MG tablet Take 1 tablet (75 mg total) by mouth daily. 03/07/16  Yes Verlee Monte, MD  DULoxetine (CYMBALTA) 30 MG capsule Take 30 mg by mouth daily.   Yes [provider]  hydrALAZINE (APRESOLINE) 50 MG tablet Take 1 tablet (50 mg total) by mouth 2 (two) times daily. 05/01/16  Yes Golden Circle, FNP  labetalol (NORMODYNE) 300 MG tablet Take 2 tablets (600 mg total) by mouth 2 (two) times daily. Patient taking differently: Take 300 mg by mouth 2 (two) times daily.  12/14/14  Yes Weaver, Scott T, PA-C  levETIRAcetam (KEPPRA) 500 MG tablet Take 1 tablet (500 mg total) by mouth 2 (two) times daily. 03/06/16  Yes Verlee Monte, MD  lidocaine (LIDODERM) 5 % Place 1 patch onto the skin daily as needed. Remove & Discard patch within 12 hours or as directed by MD    Yes [provider]   pantoprazole (PROTONIX) 40 MG tablet Take 1 tablet (40 mg total) by mouth daily. 05/01/16  Yes Golden Circle, FNP  atorvastatin (LIPITOR) 20 MG tablet Take 1 tablet (20 mg total) by mouth daily. Patient not taking: Reported on 08/11/2016 03/18/16   Pixie Casino, MD  famotidine (PEPCID) 20 MG tablet Take 1 tablet (20 mg total) by mouth 2 (two) times daily. 08/11/16   Isla Pence, MD  HYDROcodone-acetaminophen (NORCO/VICODIN) 5-325 MG tablet Take 1 tablet by mouth every 4 (four) hours as needed. 08/11/16   Isla Pence, MD    Family History Family History  Problem Relation Age of Onset  . Hypertension Mother   . Hypertension Unknown   . Colon cancer Paternal Uncle   . Stroke Maternal Aunt   . Heart attack Brother   . Diabetes Maternal Aunt   . Lung cancer Maternal Uncle   . Throat cancer Neg Hx   . Pancreatic cancer Neg Hx   . Esophageal cancer Neg Hx   . Kidney disease Neg Hx   . Liver disease Neg Hx     Social History Social History  Substance Use Topics  . Smoking status: Current Some Day Smoker    Packs/day: 0.25    Years: 23.00    Types: Cigarettes  . Smokeless tobacco: Never Used     Comment: 3 to 4 per day  . Alcohol use 0.6 oz/week    1 Cans of beer per week     Comment: occasionally     Allergies   Imdur [isosorbide nitrate] and Tramadol   Review of Systems Review of Systems  Respiratory: Positive for shortness of breath.   Cardiovascular: Positive for chest pain.  All other systems reviewed and are negative.    Physical Exam Updated Vital Signs BP (!) 177/96   Pulse 77   Temp 98.8 F (37.1 C) (Oral)   Resp 18   Ht 5\' 11"  (1.803 m)   Wt 75.8 kg (167 lb)   SpO2 97%   BMI 23.29 kg/m   Physical Exam  Constitutional: He is oriented to person, place, and time. He appears well-developed and well-nourished.  HENT:  Head: Normocephalic and atraumatic.  Right Ear: External ear normal.  Left Ear: External ear normal.  Nose: Nose normal.  Mouth/Throat: Oropharynx is clear and moist.  Eyes: Conjunctivae and EOM are normal. Pupils are equal, round, and reactive to light.  Neck: Normal range of motion. Neck supple.  Cardiovascular: Normal rate, regular rhythm, normal heart sounds and intact distal pulses.   Pulmonary/Chest: Effort normal and breath sounds normal.  Abdominal: Soft. Bowel sounds are normal.  Musculoskeletal: Normal range of motion.       Arms: Neurological: He is alert and oriented to person, place, and time.  Skin: Skin is warm.  Psychiatric: He has a normal mood and affect. His behavior is normal. Judgment and thought content normal.  Nursing note and vitals reviewed.    ED Treatments / Results  Labs (all labs ordered are listed, but only abnormal results are displayed) Labs Reviewed  BASIC METABOLIC PANEL - Abnormal; Notable for the following:       Result Value   Creatinine, Ser 1.56 (*)    GFR calc non Af Amer 52 (*)    All other components within normal limits  CBC - Abnormal; Notable for the following:    RBC 4.10 (*)    Hemoglobin 12.8 (*)    HCT 38.9 (*)    Platelets 129 (*)    All other components within normal limits  URINALYSIS, ROUTINE W REFLEX MICROSCOPIC - Abnormal; Notable for the following:    Color, Urine STRAW (*)    Hgb urine dipstick SMALL (*)    Protein, ur 100 (*)    All other components within normal limits  RAPID URINE DRUG SCREEN, HOSP PERFORMED - Abnormal; Notable for the following:    Opiates POSITIVE (*)    Tetrahydrocannabinol POSITIVE (*)    All other components within normal limits  LIPASE, BLOOD  ETHANOL  I-STAT TROPOININ, ED  I-STAT TROPOININ, ED    EKG  EKG Interpretation  Date/Time:  Tuesday Aug 11 2016 14:13:54 EDT Ventricular Rate:  65 PR Interval:  154 QRS Duration: 90 QT Interval:  424 QTC Calculation: 440 R Axis:   65 Text Interpretation:  Normal sinus rhythm Biatrial enlargement Left ventricular hypertrophy with repolarization abnormality  Abnormal ECG worsened t wave changes laterally when compared to prior Confirmed by Isla Pence [phone]) on 08/11/2016 4:22:24 PM       Radiology Dg Chest 2 View  Result Date: 08/11/2016 CLINICAL DATA:  Left-sided chest pain and dyspnea x1 week EXAM: CHEST  2 VIEW COMPARISON:  03/01/2016 FINDINGS: Heart size is top-normal. Median sternotomy sutures, prosthetic aortic valve and aortic stent graft are again visualized without significant interval change. Lungs are free of pneumonic consolidation, CHF, effusion or pneumothorax. No acute osseous abnormality. Surgical clips project over the right shoulder. IMPRESSION: No active cardiopulmonary disease. Electronically Signed   By: Ashley Royalty M.D.   On: 08/11/2016 15:02   Ct Angio Chest/abd/pel For Dissection W And/or Wo Contrast  Result Date: 08/11/2016 CLINICAL DATA:  Left chest and abdomen pain, worsening over the past 3 days. Aortic stent graft placed 3 years ago. EXAM: CT ANGIOGRAPHY CHEST, ABDOMEN AND PELVIS TECHNIQUE: Multidetector CT imaging through the chest, abdomen and pelvis was performed using the standard protocol during bolus administration of intravenous contrast. Multiplanar reconstructed images and MIPs were obtained and reviewed to evaluate the vascular anatomy. CONTRAST:  100 cc Isovue 370 COMPARISON:  Chest radiographs obtained earlier today and previous chest, abdomen and pelvis CT examinations. FINDINGS: CTA CHEST FINDINGS Cardiovascular: Prosthetic aortic valve, reimplanted brachiocephalic vessels arising from the proximal aorta and stent graft in the remainder of the  thoracic aorta without significant change since 10/13/2015. Stable thrombosis in the descending thoracic aorta false lumen with no contrast seen in the thrombosed false lumen distally on today's examination. The heart remains borderline enlarged. Mediastinum/Nodes: Stable mildly prominent mediastinal nodes with no abnormally enlarged nodes seen. Normal appearing thyroid  gland. Lungs/Pleura: Mild right lower lobe bullous change. Mild left upper lobe bullous change. No lung nodules or pleural fluid. Mildly improved atelectasis adjacent to the false lumen of the descending thoracic aorta. Musculoskeletal: Minimal thoracic spine degenerative changes. Review of the MIP images confirms the above findings. CTA ABDOMEN AND PELVIS FINDINGS VASCULAR Aorta: Again demonstrated is dissection involving the proximal abdominal aorta from just below the diaphragmatic hiatus to below the origin of the left renal artery. There has been interval partial thrombosis of the false lumen with some persistent opacification of the false lumen. Celiac: The celiac axis again arises from the opacified false lumen and appears normal. SMA: Arises from the true lumen and appears normal. Renals: Arise from the true lumen, normal appearing on the right in with a proximal stent on the left. IMA: Arises from the true lumen with a focal high-grade proximal stenosis best seen on sagittal image number 88 of series 14. This is also seen on axial image number 138 of series 8. Inflow: Bilateral by external and internal iliac artery plaque formation without significant luminal narrowing. Veins: No obvious venous abnormality within the limitations of this arterial phase study. Review of the MIP images confirms the above findings. NON-VASCULAR Hepatobiliary: No focal liver abnormality is seen. No gallstones, gallbladder wall thickening, or biliary dilatation. Pancreas: Unremarkable. No pancreatic ductal dilatation or surrounding inflammatory changes. Spleen: Normal in size without focal abnormality. Adrenals/Urinary Tract: Adrenal glands are unremarkable. Kidneys are normal, without renal calculi, focal lesion, or hydronephrosis. Bladder is unremarkable. Stomach/Bowel: Multiple small sigmoid colon diverticulum. No gastric or small bowel abnormalities. Normal appearing appendix. Lymphatic: No enlarged lymph nodes. Reproductive:  Moderately enlarged prostate gland with some coarse central calcification. Other: None. Musculoskeletal: Minimal lumbar spine degenerative changes. Review of the MIP images confirms the above findings. IMPRESSION: 1. No acute abnormality. 2. Interval complete thrombosis of the false lumen of the thoracic portion of the thoracic aortic dissection with previous stent graft placement. 3. Interval partial thrombosis of the false lumen of the proximal abdominal aortic component of the aortic dissection with continued normal opacification of the celiac axis from the false lumen. 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 5. Sigmoid diverticulosis. 6. Moderately enlarged prostate gland. 7. Mild changes of COPD. Electronically Signed   By: Claudie Revering M.D.   On: 08/11/2016 18:29    Procedures Procedures (including critical care time)  Medications Ordered in ED Medications  sodium chloride 0.9 % bolus 1,000 mL (1,000 mLs Intravenous New Bag/Given 08/11/16 1721)  labetalol (NORMODYNE,TRANDATE) injection 20 mg (20 mg Intravenous Given 08/11/16 1722)  morphine 4 MG/ML injection 4 mg (4 mg Intravenous Given 08/11/16 1731)  ondansetron (ZOFRAN) injection 4 mg (4 mg Intravenous Given 08/11/16 1730)  famotidine (PEPCID) IVPB 20 mg premix (0 mg Intravenous Stopped 08/11/16 1924)  iopamidol (ISOVUE-370) 76 % injection (100 mLs  Contrast Given 08/11/16 1748)  HYDROmorphone (DILAUDID) injection 1 mg (1 mg Intravenous Given 08/11/16 1828)  hydrALAZINE (APRESOLINE) injection 20 mg (20 mg Intravenous Given 08/11/16 1922)     Initial Impression / Assessment and Plan / ED Course  I have reviewed the triage vital signs and the nursing notes.  Pertinent labs & imaging results  that were available during my care of the patient were reviewed by me and considered in my medical decision making (see chart for details).    Loop recorder interrogated and there was no abnormalities.  Pt spoke with Dr.  Trula Slade (vascular) regarding stenosis of IMA.  He said no intervention needed.  Pt is feeling better.  BP has improved.  [name] sx very atypical for CAD.  They sound more like GERD sx.  Pt encouraged to take meds.  He is encouraged to stop smoking.  He knows to return if worse.    Final Clinical Impressions(s) / ED Diagnoses   Final diagnoses:  Chest wall pain  Gastroesophageal reflux disease, esophagitis presence not specified  Essential hypertension  Tobacco abuse    New Prescriptions New Prescriptions   FAMOTIDINE (PEPCID) 20 MG TABLET    Take 1 tablet (20 mg total) by mouth 2 (two) times daily.   HYDROCODONE-ACETAMINOPHEN (NORCO/VICODIN) 5-325 MG TABLET    Take 1 tablet by mouth every 4 (four) hours as needed.     Isla Pence, MD 08/11/16 2039

## 2016-08-11 NOTE — ED Triage Notes (Signed)
Pt reports left side chest pain with shortness of breath. Pt denies any other symptoms. Skin warm and dry. Pt has significant cardiac hx and reports he had a loop recorder placed approximately 1 month ago. No acute distress noted at this time.

## 2016-08-11 NOTE — ED Notes (Signed)
Lab called cbc tube is clotted and will need recollect

## 2016-08-11 NOTE — ED Notes (Signed)
Pt returned from CT placed back on cardiac monitor.  St's no relief from pain med.  MD made aware.

## 2016-08-18 ENCOUNTER — Ambulatory Visit (INDEPENDENT_AMBULATORY_CARE_PROVIDER_SITE_OTHER): Payer: Medicare Other | Admitting: *Deleted

## 2016-08-18 DIAGNOSIS — I639 Cerebral infarction, unspecified: Secondary | ICD-10-CM

## 2016-08-19 NOTE — Progress Notes (Signed)
[facility] Summary Report / Loop Recorder 

## 2016-08-24 LAB — CUP PACEART REMOTE DEVICE CHECK
Date Time Interrogation Session: 20180605173942
MDC IDC PG IMPLANT DT: 20180406

## 2016-08-24 NOTE — Progress Notes (Signed)
[facility] summary report received. Battery status OK. Normal device function. No new symptom episodes, tachy episodes, brady, or pause episodes. No new AF episodes. Monthly summary reports and ROV/PRN 

## 2016-09-11 ENCOUNTER — Encounter (HOSPITAL_COMMUNITY): Payer: Self-pay | Admitting: *Deleted

## 2016-09-11 ENCOUNTER — Emergency Department (HOSPITAL_COMMUNITY)
Admission: EM | Admit: 2016-09-11 | Discharge: 2016-09-11 | Disposition: A | Discharge status: Home / Self Care | Payer: Medicare Other | Attending: Emergency Medicine | Admitting: Emergency Medicine

## 2016-09-11 DIAGNOSIS — N183 Chronic kidney disease, stage 3 (moderate): Secondary | ICD-10-CM | POA: Diagnosis not present

## 2016-09-11 DIAGNOSIS — Z8673 Personal history of transient ischemic attack (TIA), and cerebral infarction without residual deficits: Secondary | ICD-10-CM | POA: Diagnosis not present

## 2016-09-11 DIAGNOSIS — I130 Hypertensive heart and chronic kidney disease with heart failure and stage 1 through stage 4 chronic kidney disease, or unspecified chronic kidney disease: Secondary | ICD-10-CM | POA: Diagnosis not present

## 2016-09-11 DIAGNOSIS — I2510 Atherosclerotic heart disease of native coronary artery without angina pectoris: Secondary | ICD-10-CM | POA: Diagnosis not present

## 2016-09-11 DIAGNOSIS — L03211 Cellulitis of face: Secondary | ICD-10-CM

## 2016-09-11 DIAGNOSIS — F17210 Nicotine dependence, cigarettes, uncomplicated: Secondary | ICD-10-CM | POA: Insufficient documentation

## 2016-09-11 DIAGNOSIS — Z79899 Other long term (current) drug therapy: Secondary | ICD-10-CM | POA: Insufficient documentation

## 2016-09-11 DIAGNOSIS — I5042 Chronic combined systolic (congestive) and diastolic (congestive) heart failure: Secondary | ICD-10-CM | POA: Insufficient documentation

## 2016-09-11 MED ORDER — SULFAMETHOXAZOLE-TRIMETHOPRIM 800-160 MG PO TABS
1.0000 | ORAL_TABLET | Freq: Once | ORAL | Status: AC
  Administered 2016-09-11: 1 via ORAL
  Filled 2016-09-11: qty 1

## 2016-09-11 MED ORDER — SULFAMETHOXAZOLE-TRIMETHOPRIM 800-160 MG PO TABS: 1.0000 | ORAL_TABLET | Freq: Two times a day (BID) | ORAL | 0 refills | Status: AC

## 2016-09-11 MED ORDER — OXYCODONE-ACETAMINOPHEN 5-325 MG PO TABS
1.0000 | ORAL_TABLET | Freq: Once | ORAL | Status: AC
  Administered 2016-09-11: 1 via ORAL
  Filled 2016-09-11: qty 1

## 2016-09-11 NOTE — ED Provider Notes (Signed)
[facility] DEPT Provider Note   CSN: [id number] Arrival date & time: 09/11/16  4917   By signing my name below, I, [name], attest that this documentation has been prepared under the direction and in the presence of Virgel Manifold, MD . Electronically Signed: Evelene Ewing, Scribe. 09/11/2016. 9:12 AM.   History   Chief Complaint Chief Complaint  Patient presents with  . Facial Swelling   The history is provided by the patient. No language interpreter was used.    HPI Comments:  [name] is a 46 y.o. male who presents to the Emergency Department complaining of moderate pain to left face x 2 days with associated facial swelling. He states he was bitten by a mosquito prior to symptom onset. He also notes mashing the site led to mild pus drainage yesterday. He denies dental pain, fever, and chills.  No alleviating factors noted.   Past Medical History:  Diagnosis Date  . Adenomatous colon polyp 08/2015  . Anxiety   . Aortic disease ([place])   . Aortic dissection (HCC)    a. admx 04/2014 >> L renal infarct; a/c renal failure >> b.  s/p Bioprosthetic [place] and total arch replacement and staged endovascular repair of descending aortic aneurysm ([facility] - Dr. [name])  . CAD (coronary artery disease)    a. LHC 4/16:  oD1 60%  . Cardiomyopathy ([place])    a. non-ischemic - probably related to untreated HTN and ETOH abuse - Echo 3/13 with EF 35-40% >> b. Echo 4/16: Severe LVH, EF 55-60%, moderate AI, moderate MR, mild LAE, trivial effusion, known type B dissection with communication between true and false lumens with suprasternal images suggesting dissection plane may propagate to at least left subclavian takeoff, root above aortic valve okay    . Chronic abdominal pain   . Chronic combined systolic and diastolic congestive heart failure ([place])    a. 05/2011: Adm with pulm edema/HTN urgency, EF 35-40% with diffuse hypokinesis and moderate to severe mitral regurgitation. Cardiomyopathy  likely due to uncontrolled HTN and ETOH abuse - cath deferred due to renal insufficiency (felt due to uncontrolled HTN). bJodie Echevaria MV 06/2011: EF 37% and no ischemia or infarction. c. EF 45-50% by echo 01/2012.  [name] Chronic sinusitis   . CKD (chronic kidney disease)    a. 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  peak creatinine 3.46 during admx for aortic dissection 2/16.  sees Dr [name]  . Colon polyps 09/11/2015   Descending, sigmoid polyps  . DDD (degenerative disc disease), lumbar   . Depression   . Descending thoracic aortic aneurysm ([place])   . Dissecting aneurysm of thoracic aorta ([place])   . ETOH abuse    a. Reported to have quit 05/2011.  . Frequent headaches   . GERD (gastroesophageal reflux disease)   . Headache(784.0)    "q other day" (08/08/2013)  . Heart murmur   . Hemorrhoid thrombosis   . History of echocardiogram    Echo 1/17:  Severe LVH, EF 55-60%, no RWMA, Gr 2 DD, AVR ok, mild to mod MR, mild LAE, mild reduced RVSF, mod RAE  . History of medication noncompliance   . HYPERLIPIDEMIA   . Hypertension    a. Hx of HTN urgency secondary to noncompliance. b. urinary metanephrine and catecholeamine levels normal 2013.  c. Renal art [name] 1/16:  No evidence of renal artery stenosis noted bilaterally.  . INGUINAL HERNIA   . Pneumonia ~ 2013  . Renal insufficiency   . Stroke ([place])   .  Tobacco abuse   . Valvular heart disease    a. Echo 05/2011: moderate to severe eccentric MR and mild to moderate AI with prolapsing left coronary cusp. b. Echo 01/2012: mild-mod AI, mild dilitation of aortic root, mild MR.;  c. Echo 1/16: Severe LVH consistent with hypertrophic cardio myopathy, EF 50%, no RWMA, mod AI, mild MR, mild RAE, dilated Ao root (40 mm);     Patient Active Problem List   Diagnosis Date Noted  . Cerebral infarction due to embolism of left middle cerebral artery ([place]) 06/03/2016  . History of aortic dissection 06/03/2016  . Palpitation   . Cerebrovascular accident (CVA) due to  embolism of left middle cerebral artery ([place])   . Seizures ([place]) 02/28/2016  . HLD (hyperlipidemia) 08/28/2015  . 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[place]) 06/07/2015  . Cardiomyopathy ([place]) 03/01/2015  . Acid reflux 03/01/2015  . Essential hypertension 03/01/2015  . Cardiac chest pain 12/10/2014  . Abdominal pain 10/12/2014  . S/P aortic dissection repair 10/03/2014  . H/O aortic valve replacement 10/03/2014  . CKD (chronic kidney disease) 05/15/2014  . AKI (acute kidney injury) ([place])   . Aortic dissection, thoracoabdominal ([place])   . 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 Aortic dissection, thoracic ([place]) 04/24/2014  . Aortic dissection ([place]) 04/23/2014  . Aneurysm of ascending aorta (HCC) 03/28/2014  . Dyspnea 03/27/2014  . ETOH abuse 09/20/2013  . Malignant hypertension 02/20/2013  . Hypertensive urgency 01/27/2012  . Cardiomyopathy, hypertensive ([place]) 01/26/2012  . AI (aortic insufficiency) 01/26/2012  . MR (mitral regurgitation) 01/26/2012  . Acute renal failure superimposed on stage 3 chronic kidney disease ([place]) 01/26/2012  . Systolic CHF, chronic ([place]) 06/10/2011  . Chronic bronchitis 05/23/2011  . Cardiomegaly - hypertensive 05/22/2011  . Marijuana use 05/22/2011  . Abdominal pain 05/22/2011  . Hypertension, malignant 05/22/2011  . Anxiety and depression 05/22/2011  . Hyperlipidemia LDL goal <70 08/26/2009  . Tobacco use 08/26/2009  . Headache(784.0) 08/26/2009  . Aortic valve disorder 03/11/2009  . INGUINAL HERNIA 02/18/2009  . Uncontrolled hypertension 01/16/2009    Past Surgical History:  Procedure Laterality Date  . ANKLE SURGERY Bilateral    Fractures bilaterally  . AORTIC VALVE SURGERY  09/2014  . FOOT FRACTURE SURGERY Bilateral 2004-2010   "got pins in both of them"  . HEMORRHOID SURGERY N/A 06/15/2015   Procedure: HEMORRHOIDECTOMY;  Surgeon: Stark Klein, MD;  Location: [facility];  Service: General;  Laterality: N/A;  .  INGUINAL HERNIA REPAIR Right ~ 1996  . LEFT HEART CATHETERIZATION WITH CORONARY ANGIOGRAM N/A 06/21/2014   Procedure: LEFT HEART CATHETERIZATION WITH CORONARY ANGIOGRAM;  Surgeon: Larey Dresser, MD;  Location: [facility] CATH LAB;  Service: Cardiovascular;  Laterality: N/A;  . LOOP RECORDER INSERTION N/A 06/19/2016   Procedure: Loop Recorder Insertion;  Surgeon: Deboraha Sprang, MD;  Location: [facility] CV LAB;  Service: Cardiovascular;  Laterality: N/A;  . TEE WITHOUT CARDIOVERSION N/A 03/12/2016   Procedure: TRANSESOPHAGEAL ECHOCARDIOGRAM (TEE);  Surgeon: Sanda Klein, MD;  Location: [facility] ENDOSCOPY;  Service: Cardiovascular;  Laterality: N/A;       Home Medications    Prior to Admission medications   Medication Sig Start Date End Date Taking? Authorizing Provider  acetaminophen (TYLENOL) 500 MG tablet Take 1,000 mg by mouth every 8 (eight) hours as needed for mild pain or moderate pain.    Yes [provider]  amLODipine (NORVASC) 10 MG tablet Take 10 mg by mouth at bedtime.   Yes [provider]  atorvastatin (LIPITOR) 20 MG tablet Take 1 tablet (20 mg total) by mouth daily. 03/18/16  Yes Hilty, Nadean Corwin, MD  cloNIDine (CATAPRES) 0.3 MG tablet Take 0.3 mg by mouth 2 (two) times daily.   Yes [provider]  DULoxetine (CYMBALTA) 30 MG capsule Take 30 mg by mouth daily.   Yes [provider]  famotidine (PEPCID) 20 MG tablet Take 1 tablet (20 mg total) by mouth 2 (two) times daily. 08/11/16  Yes Isla Pence, MD  hydrALAZINE (APRESOLINE) 50 MG tablet Take 1 tablet (50 mg total) by mouth 2 (two) times daily. 05/01/16  Yes Golden Circle, FNP  HYDROcodone-acetaminophen (NORCO/VICODIN) 5-325 MG tablet Take 1 tablet by mouth every 4 (four) hours as needed. 08/11/16  Yes Isla Pence, MD  labetalol (NORMODYNE) 300 MG tablet Take 2 tablets (600 mg total) by mouth 2 (two) times daily. Patient taking differently: Take 300 mg by mouth 2 (two) times daily.  12/14/14   Yes Weaver, Scott T, PA-C  levETIRAcetam (KEPPRA) 500 MG tablet Take 1 tablet (500 mg total) by mouth 2 (two) times daily. 03/06/16  Yes Verlee Monte, MD  pantoprazole (PROTONIX) 40 MG tablet Take 1 tablet (40 mg total) by mouth daily. 05/01/16  Yes Golden Circle, FNP  cloNIDine (CATAPRES - DOSED IN MG/24 HR) 0.3 mg/24hr patch Place 1 patch (0.3 mg total) onto the skin once a week. Patient not taking: Reported on 09/11/2016 06/12/16   Deboraha Sprang, MD  clopidogrel (PLAVIX) 75 MG tablet Take 1 tablet (75 mg total) by mouth daily. Patient not taking: Reported on 09/11/2016 03/07/16   Verlee Monte, MD    Family History Family History  Problem Relation Age of Onset  . Hypertension Mother   . Hypertension Unknown   . Colon cancer Paternal Uncle   . Stroke Maternal Aunt   . Heart attack Brother   . Diabetes Maternal Aunt   . Lung cancer Maternal Uncle   . Throat cancer Neg Hx   . Pancreatic cancer Neg Hx   . Esophageal cancer Neg Hx   . Kidney disease Neg Hx   . Liver disease Neg Hx     Social History Social History  Substance Use Topics  . Smoking status: Current Some Day Smoker    Packs/day: 0.25    Years: 23.00    Types: Cigarettes  . Smokeless tobacco: Never Used     Comment: 3 to 4 per day  . Alcohol use 0.6 oz/week    1 Cans of beer per week     Comment: occasionally     Allergies   Imdur [isosorbide nitrate] and Tramadol   Review of Systems Review of Systems  Constitutional: Negative for chills and fever.  HENT: Positive for facial swelling (and facial pain ).   Respiratory: Negative for shortness of breath.   Cardiovascular: Negative for chest pain.  All other systems reviewed and are negative.    Physical Exam Updated Vital Signs BP (!) 221/113   Pulse (!) 56   Temp 98.4 F (36.9 C) (Oral)   Resp 14   Ht 5\' 11"  (1.803 m)   Wt 167 lb (75.8 kg)   SpO2 100%   BMI 23.29 kg/m   Physical Exam  Constitutional: He is oriented to person, place, and  time. He appears well-developed and well-nourished. No distress.  HENT:  Head: Normocephalic and atraumatic.  Tenderness and mild swelling anterior to left ear  No fluctuance or induration  No  intraoral lesion  External auditory canal is normal  Mild tenderness just underneath the angle of the left mandible; no swelling, no trismus   Eyes: Conjunctivae are normal.  Cardiovascular: Normal rate and regular rhythm.   Murmur (systolic) heard. Pulmonary/Chest: Effort normal and breath sounds normal. No respiratory distress.  Abdominal: He exhibits no distension.  Musculoskeletal: Normal range of motion.  Neurological: He is alert and oriented to person, place, and time.  Skin: Skin is warm and dry.  Nursing note and vitals reviewed.    ED Treatments / Results  DIAGNOSTIC STUDIES:  Oxygen Saturation is 100% on RA, normal by my interpretation.    COORDINATION OF CARE:  9:12 AM Discussed treatment plan with pt at bedside and pt agreed to plan.  Labs (all labs ordered are listed, but only abnormal results are displayed) Labs Reviewed - No data to display  EKG  EKG Interpretation None       Radiology No results found.  Procedures Procedures (including critical care time)  Medications Ordered in ED Medications - No data to display   Initial Impression / Assessment and Plan / ED Course  I have reviewed the triage vital signs and the nursing notes.  Pertinent labs & imaging results that were available during my care of the patient were reviewed by me and considered in my medical decision making (see chart for details).     46yM with L facial swelling/pain. He reports mosquito bite and got some pus out when squeezing in this area. I do not see any wounds or areas that look like they having been draining though. Does have tenderness, faint erythema and swelling just in front of L ear. Interestingly has some mild TTP just below body/angle of L mandible although no swelling or  other skin changes in this area. Will treat for possible cellulitis. There is nothing to drain at this point. Consider parotitis with location of symptoms as well. I do not think this is an odontologic process. L ear looks fine. No findings to suggest airway compromise.    Final Clinical Impressions(s) / ED Diagnoses   Final diagnoses:  Facial cellulitis    New Prescriptions New Prescriptions   No medications on file   I personally preformed the services scribed in my presence. The recorded information has been reviewed is accurate. Virgel Manifold, MD.     Virgel Manifold, MD 09/11/16 [phone]

## 2016-09-11 NOTE — ED Triage Notes (Signed)
Pt reports being bitten by a mosquito on the R side of his face x2 days ago with swelling to L face afterwards, denies ear pain & dental pain, denies injury to the area, pt hypertensive in triage with hx of the same, pt denies other symptoms, A&O x4

## 2016-09-17 ENCOUNTER — Ambulatory Visit (INDEPENDENT_AMBULATORY_CARE_PROVIDER_SITE_OTHER): Payer: Medicare Other | Admitting: *Deleted

## 2016-09-17 DIAGNOSIS — I639 Cerebral infarction, unspecified: Secondary | ICD-10-CM | POA: Diagnosis not present

## 2016-09-18 NOTE — Progress Notes (Signed)
[facility] Summary Report / Loop Recorder 

## 2016-09-24 LAB — CUP PACEART REMOTE DEVICE CHECK
Implantable Pulse Generator Implant Date: 20180406
MDC IDC SESS DTM: 20180705201034

## 2016-09-27 ENCOUNTER — Other Ambulatory Visit: Payer: Self-pay | Admitting: Family

## 2016-10-05 ENCOUNTER — Ambulatory Visit (INDEPENDENT_AMBULATORY_CARE_PROVIDER_SITE_OTHER): Payer: Medicare Other | Admitting: Neurology

## 2016-10-05 ENCOUNTER — Encounter: Payer: Self-pay | Admitting: Neurology

## 2016-10-05 VITALS — BP 162/91 | HR 77 | Ht 71.0 in | Wt 167.6 lb

## 2016-10-05 DIAGNOSIS — R569 Unspecified convulsions: Secondary | ICD-10-CM

## 2016-10-05 DIAGNOSIS — I63412 Cerebral infarction due to embolism of left middle cerebral artery: Secondary | ICD-10-CM

## 2016-10-05 DIAGNOSIS — Z8679 Personal history of other diseases of the circulatory system: Secondary | ICD-10-CM

## 2016-10-05 DIAGNOSIS — I10 Essential (primary) hypertension: Secondary | ICD-10-CM

## 2016-10-05 NOTE — Progress Notes (Signed)
STROKE NEUROLOGY FOLLOW UP NOTE  NAME: [name] DOB: [birth year]  REASON FOR VISIT: stroke follow up HISTORY FROM: pt and chart  Today we had the pleasure of seeing [name] in follow-up at our Neurology Clinic. Pt was accompanied by no one.   History Summary Mr. [name] is a 46 y.o. male with history of aortic dissection s/p repair and reconstruction, systolic and diastolic heart failure, and HTN admitted on 02/28/16 for confusion and right-sided weakness. In ED, had seizure and subsequent cardiac arrest. CT negative for bleeding. CTA without clear LVO, but moderate narrowing of left M2 and right P2. He was not considered for IV TPA due to seizure at onset with non-stroke presentation. Intubated and started on Keppra. Post extubation had mild right hemiparesis and word finding difficulty. MRI showed left MCA embolic stroke. EF 55-60%. Negative DVT. LDL 124 and A1C 5.3. Cardiology recommended outpt TEE and 30 day cardiac event monitoring. If monitoring negative, will consider loop recorder. He was discharged with ASA and lipitor without neuro deficit.  06/02/16 follow up - the patient has been doing well. No recurrent stroke symptoms. Had 30 day cardiac event monitoring did not show afib. Had outpt TEE which was unremarkable. He was scheduled with Dr. Caryl Comes for loop recorder. Pt still feels intermittent palpitation. Cut down smoking but not quit yet.  Interval History During the interval time, pt has been doing well from stroke standpoint. He follows with Dr. [name] in [facility] and repeat TTE with EF 50%. Bp goal 120/80 due to hx of aortic dissection s/p repair. He continues on Keppra and no more seizure episode. Back to driving earlier this month. Has not quit smoking yet. BP still high 162/91. He said his BP at home similar to this. He had loop recorder with Dr. Caryl Comes and currently no Afib.   REVIEW OF SYSTEMS: Full 14 system review of systems performed and notable only for those  listed below and in HPI above, all others are negative:  Constitutional:   Cardiovascular: Ear/Nose/Throat:  Hearing loss Skin:  Eyes:  Blurry vision, loss of vision, double vision Respiratory:   Gastroitestinal:   Genitourinary:  Hematology/Lymphatic:   Endocrine:  Musculoskeletal:  Allergy/Immunology:   Neurological:  Memory loss, seizure, HA, numbness, weakness Psychiatric: depression, nervous, anxiety Sleep: restless leg  The following represents the patient's updated allergies and side effects list: Allergies  Allergen Reactions  . Imdur [Isosorbide Nitrate] Other (See Comments)    headache  . Tramadol Other (See Comments)    "headaches" per pt    The neurologically relevant items on the patient's problem list were reviewed on today's visit.  Neurologic Examination  A problem focused neurological exam (12 or more points of the single system neurologic examination, vital signs counts as 1 point, cranial nerves count for 8 points) was performed.  Blood pressure (!) 162/91, pulse 77, height 5\' 11"  (1.803 m), weight 167 lb 9.6 oz (76 kg).  General - Well nourished, well developed, in no apparent distress.  Ophthalmologic - Sharp disc margins OU.  Cardiovascular - Regular rate and rhythm with no murmur.  Mental Status -  Level of arousal and orientation to time, place, and person were intact Language including expression, naming, repetition, comprehension was assessed and found intact. Attention span and concentration were normal. Fund of Knowledge was assessed and was intact.  Cranial Nerves II - XII - II - Visual field intact OU. III, IV, VI - Extraocular movements intact. V - Facial  sensation intact bilaterally. VII - Facial movement intact bilaterally. VIII - Hearing & vestibular intact bilaterally. X - Palate elevates symmetrically. XI - Chin turning & shoulder shrug intact bilaterally. XII - Tongue protrusion intact.  Motor Strength - The patient's  strength was normal in all extremities and pronator drift was absent.  Bulk was normal and fasciculations were absent.   Motor Tone - Muscle tone was assessed at the neck and appendages and was normal.  Reflexes - The patient's reflexes were 1+ in all extremities and he had no pathological reflexes.  Sensory - Light touch, temperature/pinprick were assessed and were subjectively decreased on the right, about 70% of the left.    Coordination - The patient had normal movements in the hands and feet with no ataxia or dysmetria.  Tremor was absent.  Gait and Station - The patient's transfers, posture, gait, station, and turns were observed as normal.   Data reviewed: I personally reviewed the images and agree with the radiology interpretations.  Ct Head Code Stroke W/o Cm 02/28/2016 1. No acute intracranial abnormality. Slight worsening of bilateral frontal predominant white matter disease, likely chronic microvascular ischemia. 2. ASPECTS is 10.   Ct Angio Head W Or Wo Contrast Ct Angio Neck W Or Wo Contrast 02/28/2016 1. No intracranial arterial occlusion. 2. Moderate narrowing of the left MCA M2 segments. 3. Moderate to severe narrowing of the right PCA P2 segment. 4. Basilar artery dolichoectasia. 5. Bilateral pulmonary upper lobe consolidation, likely aspiration or pneumonia. 6. Normal CTA of the neck. 7. Aortic atherosclerosis.    Mr Brain Wo Contrast 03/03/2016 1. Large acute left MCA branch infarct. 2. Chronic white matter disease and susceptibility foci, with differential discussion above. 3. Arteriomegaly and vertebrobasilar tortuosity.   2D Echocardiogram  - Left ventricle: The cavity size was normal. Wall thickness wasincreased in a pattern of severe LVH. Systolic function wasnormal. The estimated ejection fraction was in the range of 55%to 60%. Wall motion was normal; there were no regional wallmotion abnormalities. Features are consistent with a pseudonormalleft  ventricular filling pattern, with concomitant abnormalrelaxation and increased filling pressure (grade 2 diastolicdysfunction). - Aortic valve: A bioprosthesis was present and functioningnormally. Valve area (VTI): 1.74 cm^2. Valve area (Vmax): 1.56cm^2. Valve area (Vmean): 1.69 cm^2. - Aorta: The proximal aorta is dilated. In one view what appears tobe a disseection flap is seen more distally . Suggest TEE iffurther eval needed. - Mitral valve: There was mild to moderate regurgitation. - Left atrium: The atrium was moderately dilated.  LE venous doppler - No evidence of deep vein thrombosis or baker's cysts bilaterally.  TEE - Left ventricle: The cavity size was normal. Wall thickness was   increased in a pattern of severe LVH. There was concentric   hypertrophy. Systolic function was normal. The estimated ejection   fraction was in the range of 55% to 60%. Wall motion was normal;   there were no regional wall motion abnormalities. - Aortic valve: A bioprosthesis was present and functioning   normally. No evidence of vegetation. - Aorta: S/P ascending aorta graft repair. The sinuses are   preserved and appear to be at the upper limit of normal in size.   The proximal graft suture line is very prominen, but appears   intact. The aortic arch (graft replacement) is not well seen due   to graft shadowing. There is minimal residual false lumen in the   proximal descending thoracic aorta. In the distal descending   thoracic aorta there is a  sizeable false lumen (up to 2.4 cm   thickness), but it appears to be fully thrombosed. - Mitral valve: No evidence of vegetation. There was mild   regurgitation directed centrally. - Left atrium: The atrium was mildly to moderately dilated. No   evidence of thrombus in the atrial cavity or appendage. No   spontaneous echo contrast was observed. - Right atrium: No evidence of thrombus in the atrial cavity or   appendage. No evidence of thrombus in  the atrial cavity or   appendage. - Atrial septum: No defect or patent foramen ovale was identified. - Tricuspid valve: No evidence of vegetation. - Pulmonic valve: No evidence of vegetation.  30 day cardiac event monitoring - no afib  Loop recorder - so far no afib  Component     Latest Ref Rng & Units 03/04/2016  Cholesterol     0 - 200 mg/dL 178  Triglycerides     <150 mg/dL 119  HDL Cholesterol     >40 mg/dL 30 (L)  Total CHOL/HDL Ratio     RATIO 5.9  VLDL     0 - 40 mg/dL 24  LDL (calc)     0 - 99 mg/dL 124 (H)  Hemoglobin A1C     4.8 - 5.6 % 5.3  Mean Plasma Glucose     mg/dL 105    Assessment: As you may recall, he is a 46 y.o. African American male with PMH of aortic dissection s/p repair and reconstruction, systolic and diastolic heart failure, and HTN admitted on 02/28/16 for confusion and right-sided weakness. In ED, had seizure and subsequent cardiac arrest. CT negative for bleeding. CTA without clear LVO, but moderate narrowing of left M2 and right P2. MRI showed left MCA embolic stroke. EF 55-60%. Negative DVT. LDL 124 and A1C 5.3. He was discharged with ASA and lipitor and keppra without neuro deficit. Had 30 day cardiac event monitoring did not show afib. Had outpt TEE which was unremarkable. He had loop recorder placed with Dr. Caryl Comes and so far no afib. Cut down smoking but not quit yet. No more seizure now and back to driving. BP not in good control, goal 120/80 due to hx of aortic dissection s/p repair.   Plan:  - continue plavix and lipitor for stroke prevention - continue loop recorder monitoring - quit smoking completely - follow up with Dr. [name] in [place] - check BP at home and record and bring over or call Dr. Harlow Mares for medication adjustment. BP goal 120/80 due to hx of aortic dissection s/p repair - Follow up with your primary care physician for stroke risk factor modification. Recommend maintain blood pressure goal 120/80, diabetes with hemoglobin A1c  goal below 7.0% and lipids with LDL cholesterol goal below 70 mg/dL.  - healthy diet and regular exercise - Please maintain seizure precautions and continue keppra for now. If no more seizure, can consider taper off after 2 years.  - follow up in 6 months with carolyn.   I spent more than 25 minutes of face to face time with the patient. Greater than 50% of time was spent in counseling and coordination of care. We discussed loop recorder monitoring, Bp control, quit smoking and seizure precautions.   No orders of the defined types were placed in this encounter.   Meds ordered this encounter  Medications  . DISCONTD: acetaminophen (TYLENOL) 325 MG tablet    Sig: Take by mouth.  . DISCONTD: DULoxetine (CYMBALTA) 30 MG capsule  Sig: Take by mouth.  . meclizine (ANTIVERT) 25 MG tablet    Sig: meclizine 25 mg tablet  Take 1 tablet 3 times a day by oral route as needed.  [name] DISCONTD: atorvastatin (LIPITOR) 20 MG tablet    Sig: Take by mouth.  . DISCONTD: cloNIDine (CATAPRES - DOSED IN MG/24 HR) 0.3 mg/24hr patch    Sig: clonidine 0.3 mg/24 hr weekly transdermal patch  . DISCONTD: hydrALAZINE (APRESOLINE) 50 MG tablet    Sig: hydralazine 50 mg tablet  . DISCONTD: labetalol (NORMODYNE) 300 MG tablet    Sig: labetalol 300 mg tablet  . DISCONTD: pantoprazole (PROTONIX) 40 MG tablet    Sig: pantoprazole 40 mg tablet,delayed release  TAKE 1 TABLET BY MOUTH DAILY    Patient Instructions  - continue plavix and lipitor for stroke prevention - continue loop recorder monitoring - quit smoking completely - follow up with Dr. [name] in [place] - check BP at home and record and bring over or call Dr. Harlow Mares for medication adjustment. BP goal 120/80 - Follow up with your primary care physician for stroke risk factor modification. Recommend maintain blood pressure goal 120/80, diabetes with hemoglobin A1c goal below 7.0% and lipids with LDL cholesterol goal below 70 mg/dL.  - healthy diet and regular  exercise - Please maintain seizure precautions and continue keppra for now.  - follow up in 6 months    Rosalin Hawking, MD PhD [facility] [address], [place], [place] [address] [phone]

## 2016-10-05 NOTE — Patient Instructions (Addendum)
-   continue plavix and lipitor for stroke prevention - continue loop recorder monitoring - quit smoking completely - follow up with Dr. [name] in [place] - check BP at home and record and bring over or call Dr. Harlow Mares for medication adjustment. BP goal 120/80 - Follow up with your primary care physician for stroke risk factor modification. Recommend maintain blood pressure goal 120/80, diabetes with hemoglobin A1c goal below 7.0% and lipids with LDL cholesterol goal below 70 mg/dL.  - healthy diet and regular exercise - Please maintain seizure precautions and continue keppra for now.  - follow up in 6 months

## 2016-10-19 ENCOUNTER — Ambulatory Visit (INDEPENDENT_AMBULATORY_CARE_PROVIDER_SITE_OTHER): Payer: Medicare Other | Admitting: *Deleted

## 2016-10-19 DIAGNOSIS — I639 Cerebral infarction, unspecified: Secondary | ICD-10-CM | POA: Diagnosis not present

## 2016-10-20 NOTE — Progress Notes (Signed)
[facility] Summary Report / Loop Recorder 

## 2016-10-21 IMAGING — CR DG CHEST 2V
2 series · 2 of 2 positions shown · non-contrast
Comparison: 09/11/2013 and earlier.

CLINICAL DATA: 43-year-old male chest pain shortness of breath and
cough for 1 week. Initial encounter.

EXAM:
CHEST  2 VIEW

[chest pa]
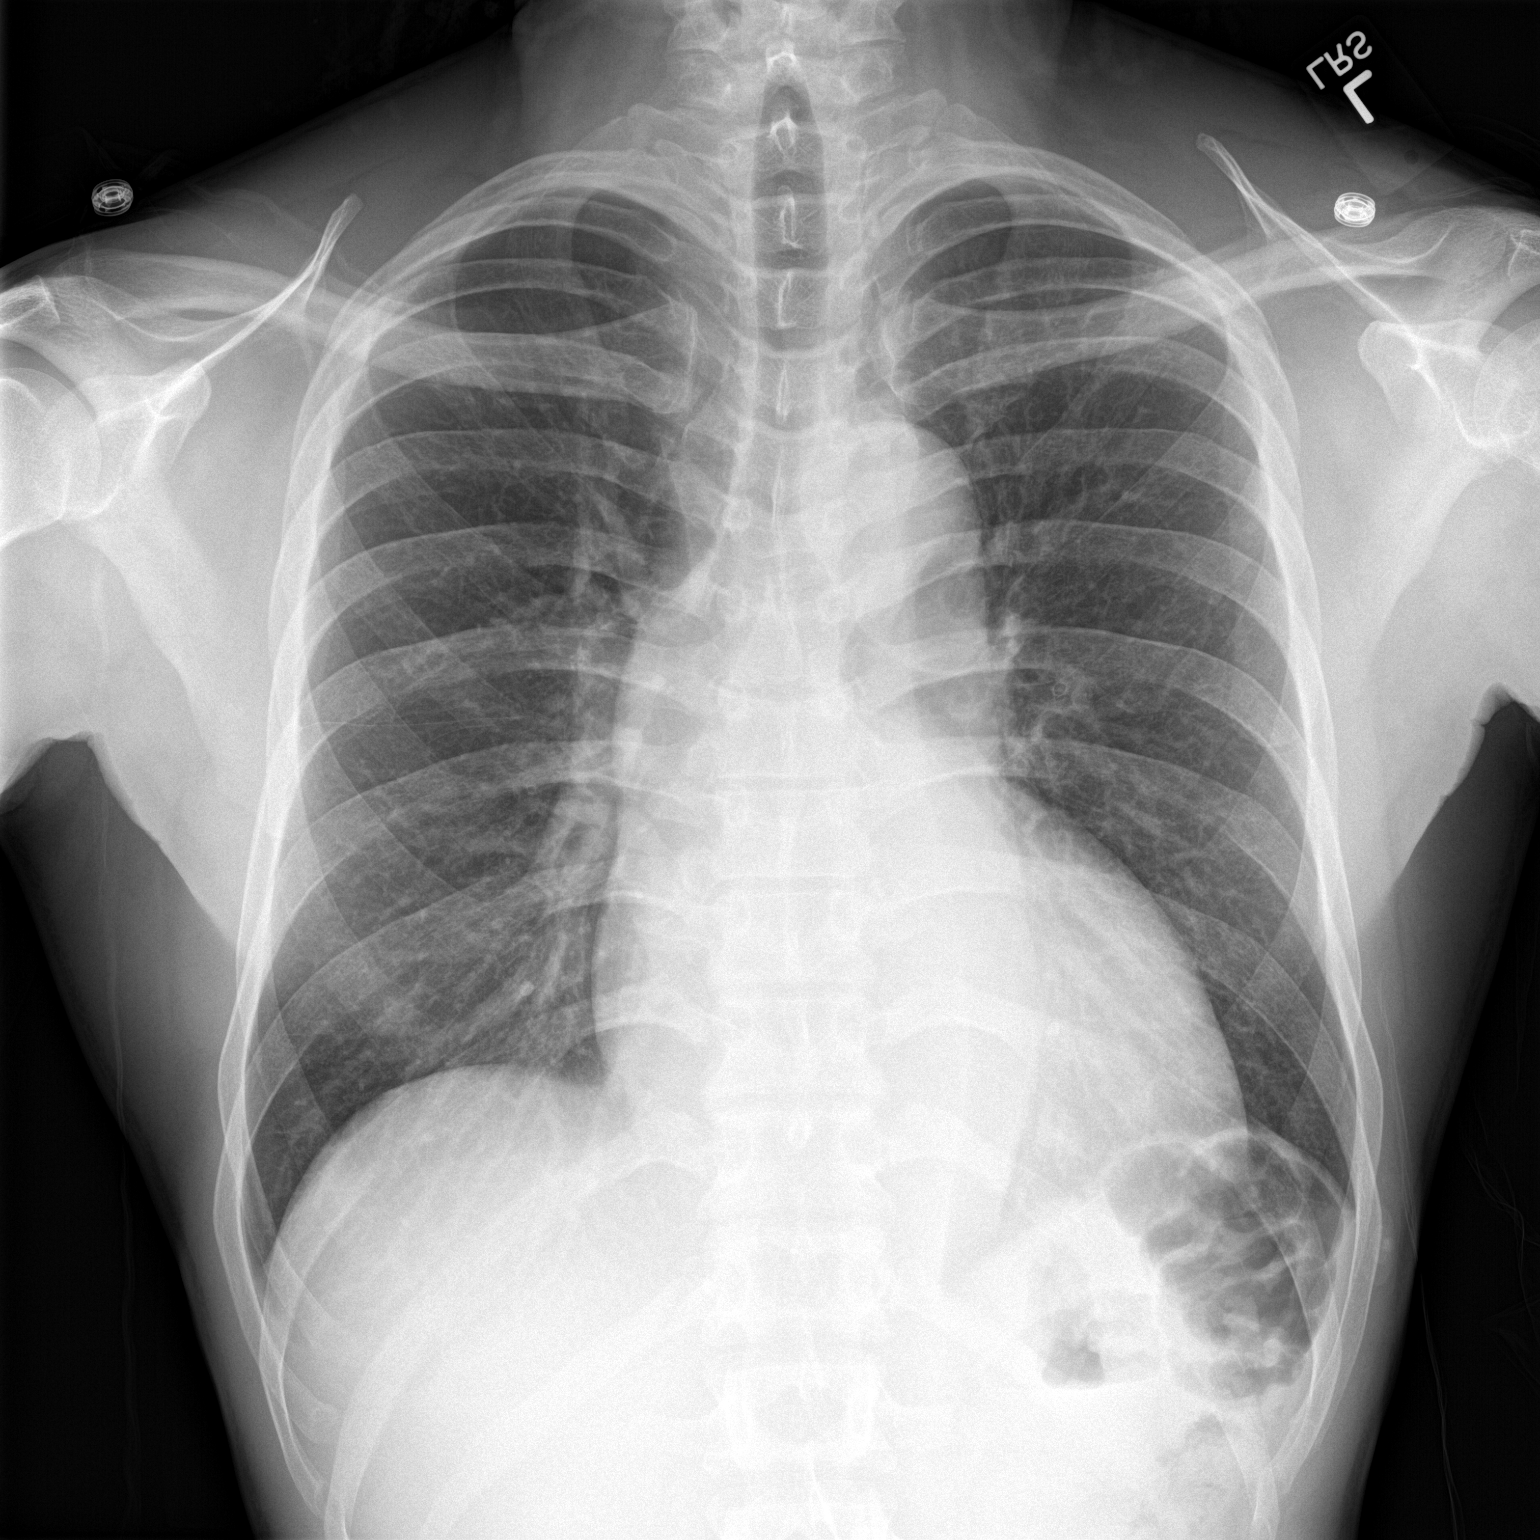

[chest lat]
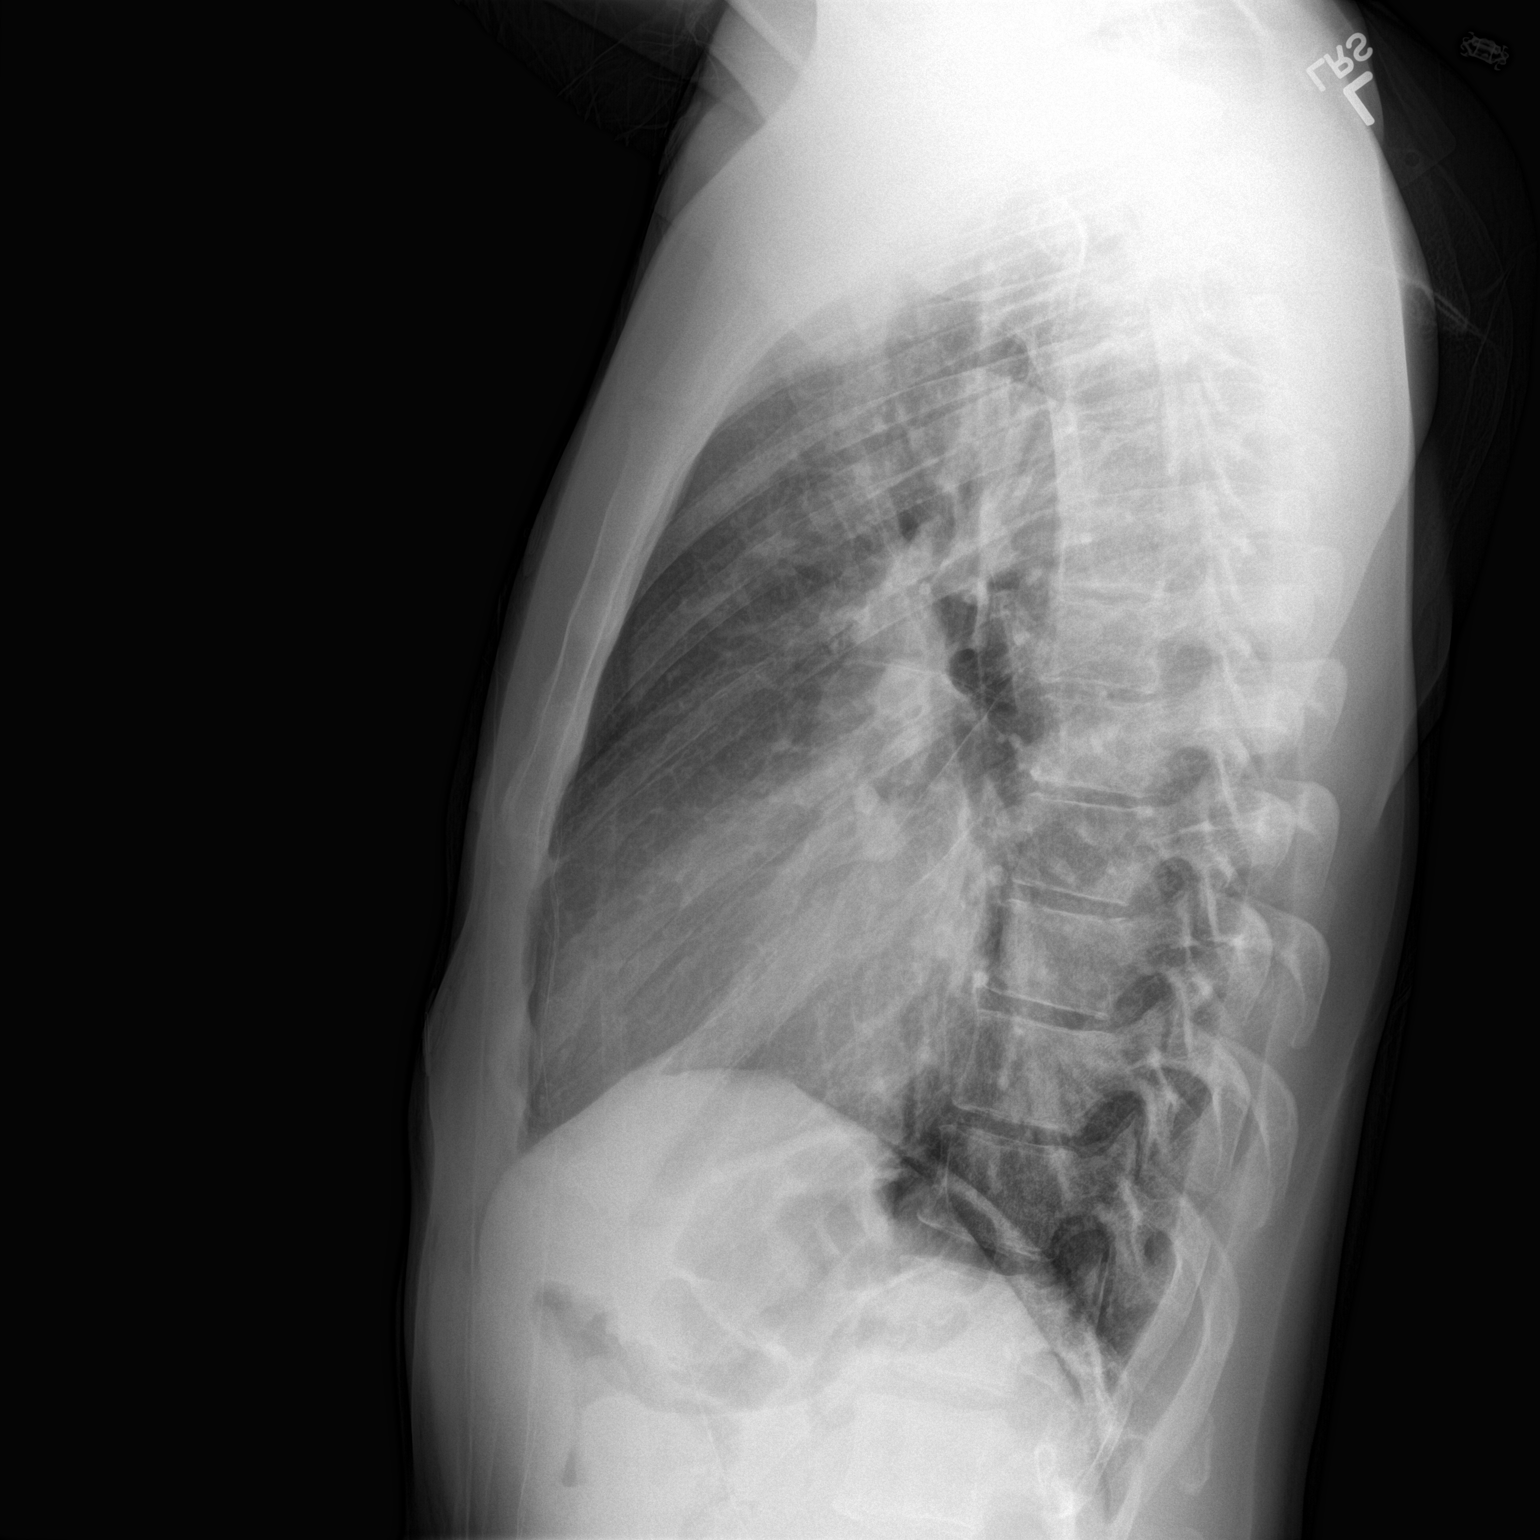

[2 of 2 positions shown; findings below may reference images not displayed]

FINDINGS: Stable cardiomegaly and mediastinal contours. Stable lung volumes.
Visualized tracheal air column is within normal limits. No
pneumothorax, pulmonary edema, pleural effusion or confluent
pulmonary opacity. Mildly increased interstitial markings diffusely.
No osseous abnormality identified.
IMPRESSION: Stable cardiomegaly. Mildly increased interstitial markings could
reflect viral or atypical respiratory infection. No focal pneumonia.

## 2016-10-28 LAB — CUP PACEART REMOTE DEVICE CHECK
Date Time Interrogation Session: 20180804204129
Implantable Pulse Generator Implant Date: 20180406

## 2016-11-17 ENCOUNTER — Ambulatory Visit (INDEPENDENT_AMBULATORY_CARE_PROVIDER_SITE_OTHER): Payer: Medicare Other | Admitting: *Deleted

## 2016-11-17 DIAGNOSIS — I639 Cerebral infarction, unspecified: Secondary | ICD-10-CM | POA: Diagnosis not present

## 2016-11-18 NOTE — Progress Notes (Signed)
[facility] Summary Report / Loop Recorder 

## 2016-11-20 DIAGNOSIS — I7103 Dissection of thoracoabdominal aorta: Secondary | ICD-10-CM | POA: Diagnosis not present

## 2016-11-20 DIAGNOSIS — R0789 Other chest pain: Secondary | ICD-10-CM | POA: Diagnosis not present

## 2016-11-20 DIAGNOSIS — J432 Centrilobular emphysema: Secondary | ICD-10-CM | POA: Diagnosis not present

## 2016-11-20 DIAGNOSIS — Z8673 Personal history of transient ischemic attack (TIA), and cerebral infarction without residual deficits: Secondary | ICD-10-CM | POA: Diagnosis not present

## 2016-11-20 DIAGNOSIS — F17210 Nicotine dependence, cigarettes, uncomplicated: Secondary | ICD-10-CM | POA: Diagnosis not present

## 2016-11-20 DIAGNOSIS — Z952 Presence of prosthetic heart valve: Secondary | ICD-10-CM | POA: Diagnosis not present

## 2016-11-20 DIAGNOSIS — Z95828 Presence of other vascular implants and grafts: Secondary | ICD-10-CM | POA: Diagnosis not present

## 2016-11-20 DIAGNOSIS — R0609 Other forms of dyspnea: Secondary | ICD-10-CM | POA: Diagnosis not present

## 2016-11-22 IMAGING — CT CT ANGIO CHEST
2 of 8 series · 18 of 36 positions shown · IV contrast (Iohexol (Omnipaque 350))
Comparison: 09/19/2012

CLINICAL DATA: Shortness of breath for 3 days, elevated D-dimer

EXAM:
CT ANGIOGRAPHY CHEST WITH CONTRAST
TECHNIQUE: Multidetector CT imaging of the chest was performed using the
standard protocol during bolus administration of intravenous
contrast. Multiplanar CT image reconstructions and MIPs were
obtained to evaluate the vascular anatomy.
CONTRAST:  80mL OMNIPAQUE IOHEXOL 350 MG/ML SOLN

[Series 403: thins pacs · axial · 0.68mm/px · z∈[-304,-20]mm · 17 of 320 slices shown]
[im 18/320  lung]
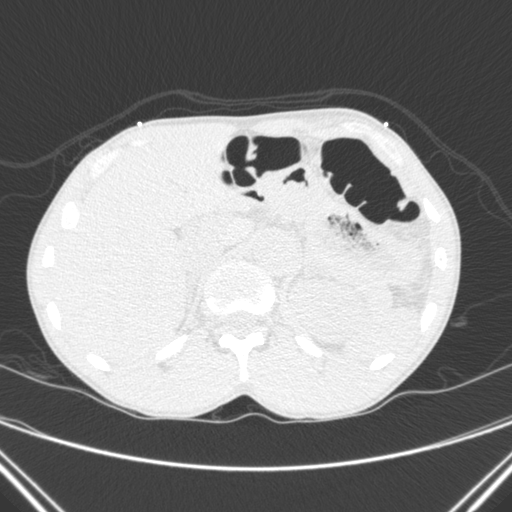
[im 36/320  mediastinal]
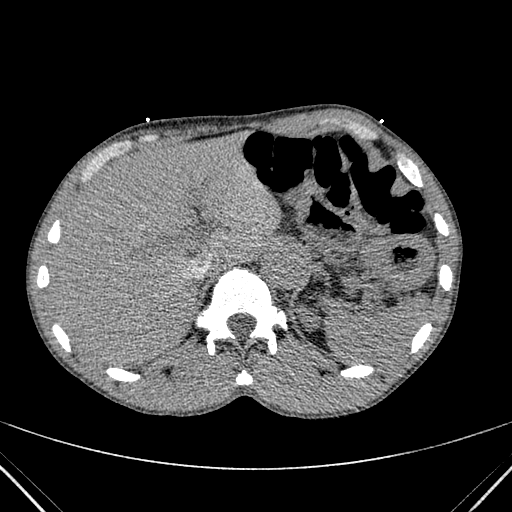
[im 54/320  lung]
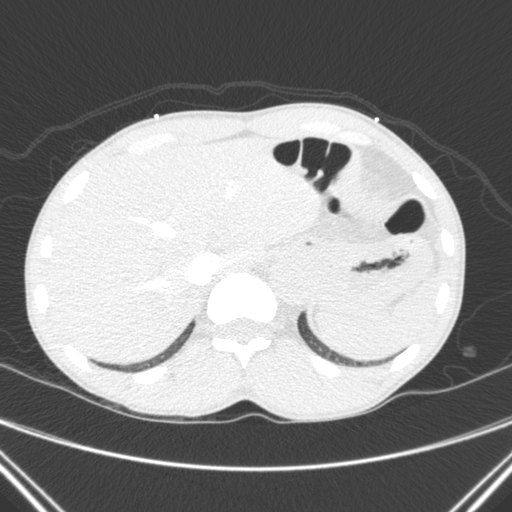
[im 71/320  mediastinal]
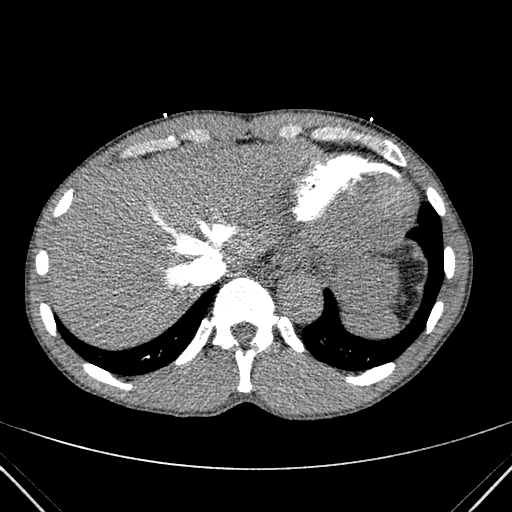
[im 89/320  lung]
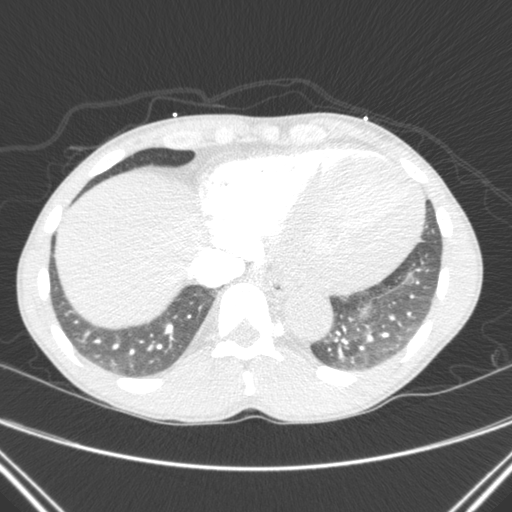
[im 107/320  mediastinal]
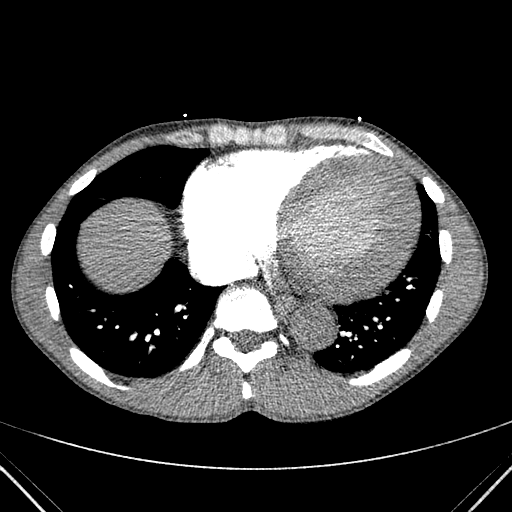
[im 125/320  lung]
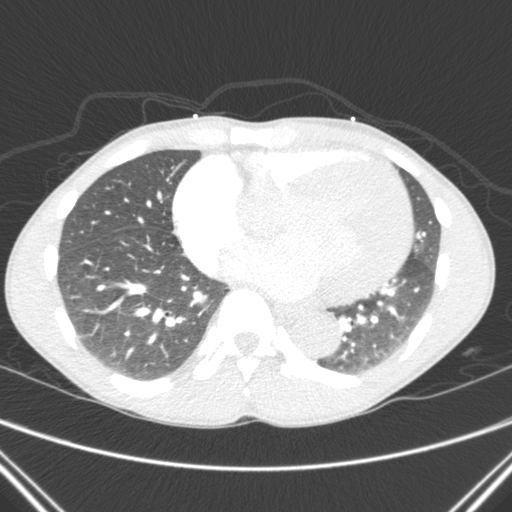
[im 142/320  mediastinal]
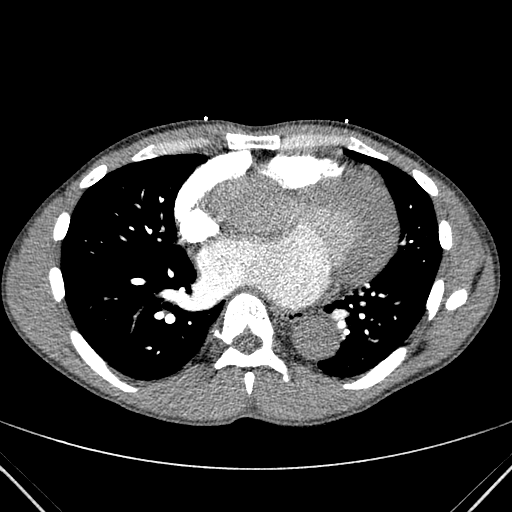
[im 160/320  lung]
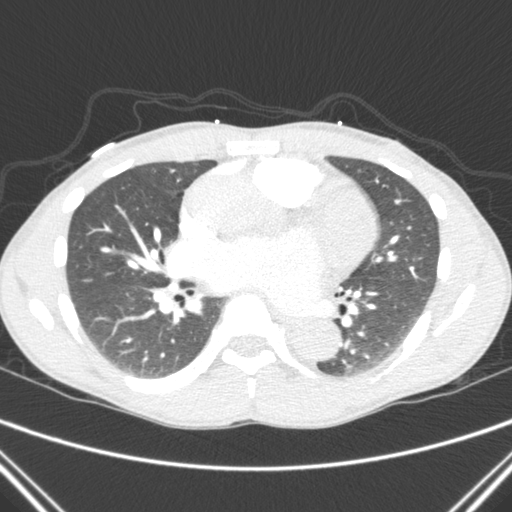
[im 178/320  mediastinal]
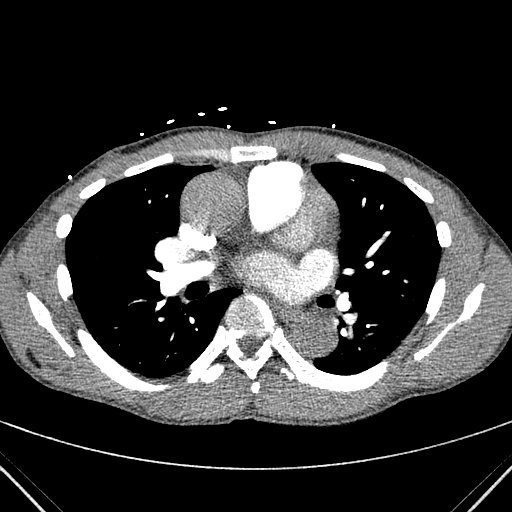
[im 195/320  lung]
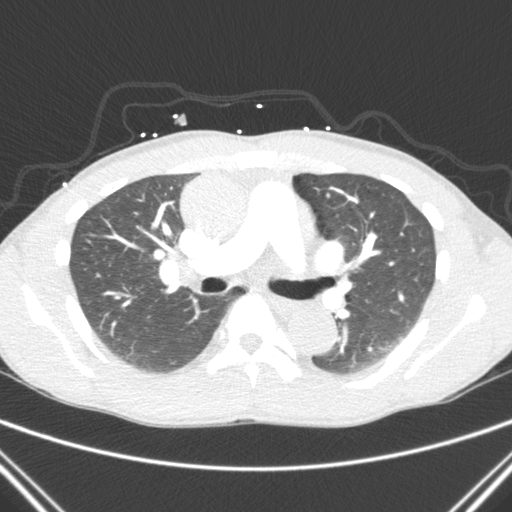
[im 213/320  mediastinal]
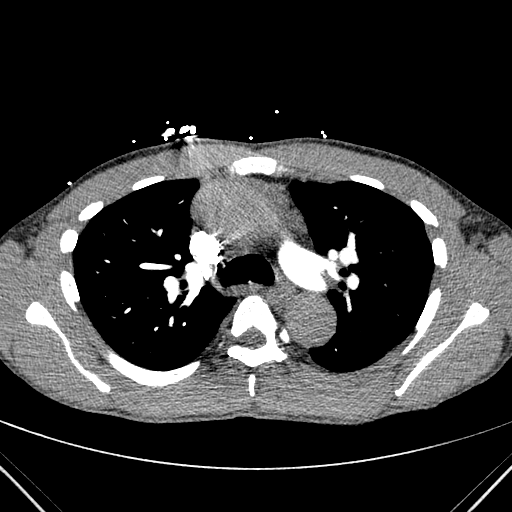
[im 231/320  lung]
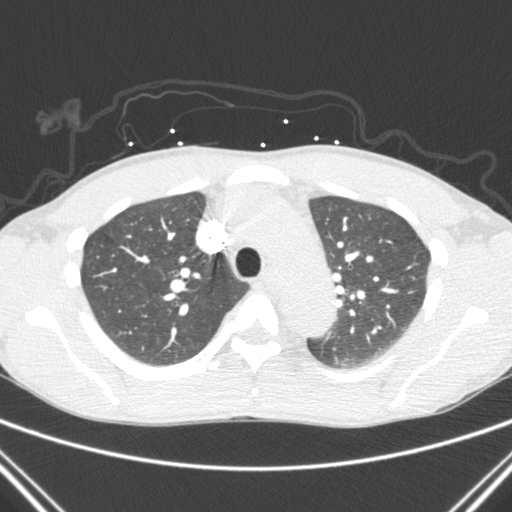
[im 249/320  mediastinal]
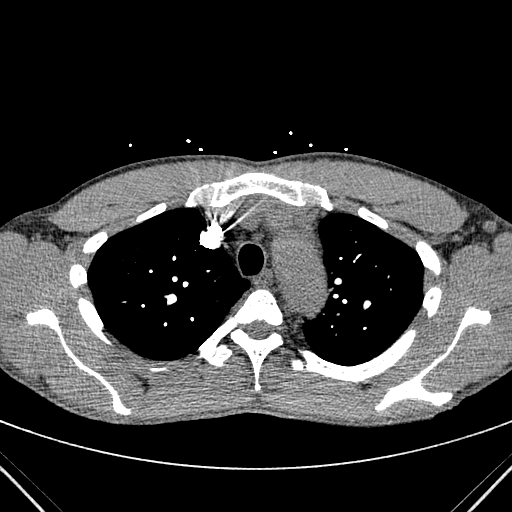
[im 266/320  lung]
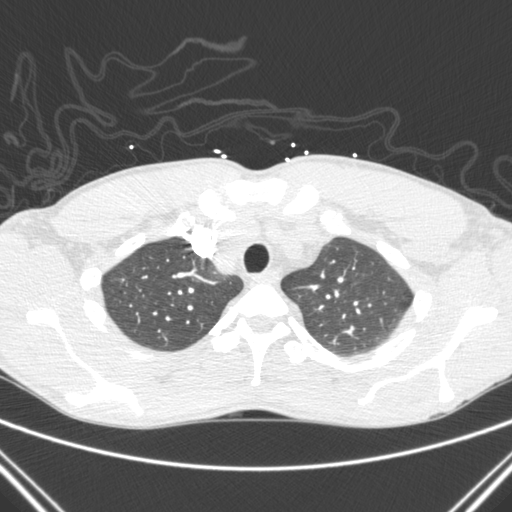
[im 284/320  mediastinal]
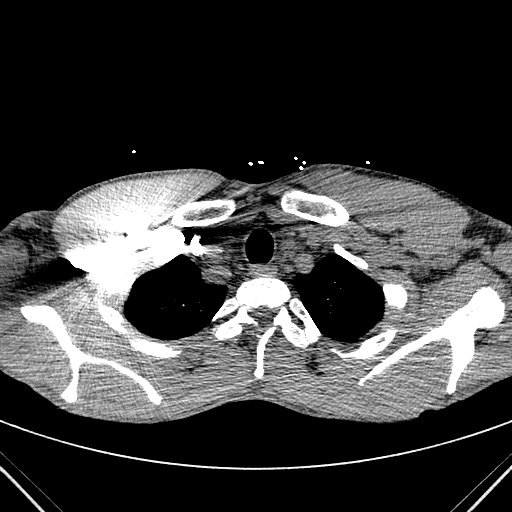
[im 302/320  lung]
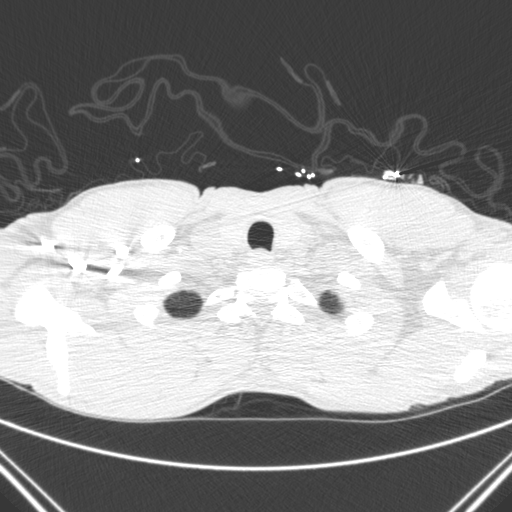

[Series 404: cor · coronal · 0.68mm/px · 1 of 100 slices shown]
[im 50/100  mediastinal]
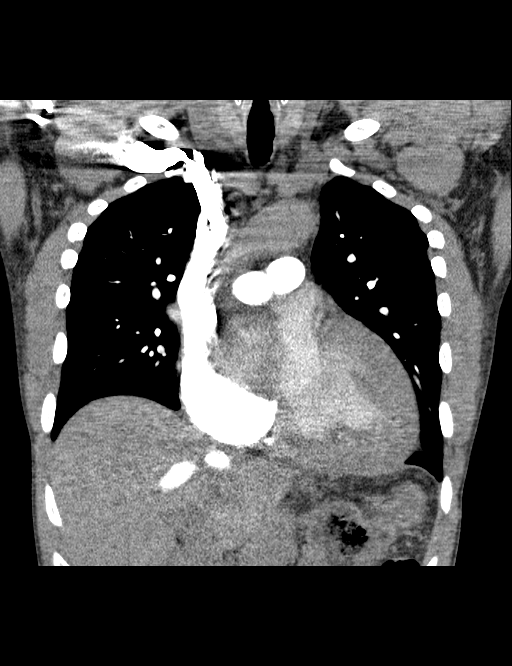

[18 of 36 positions shown; findings below may reference images not displayed]

FINDINGS: Sagittal images of the spine shows minimal degenerative changes
thoracic spine. Sagittal view of the sternum is unremarkable. Images
of the thoracic inlet are unremarkable. Central airways are patent.
No mediastinal hematoma or adenopathy. The study is of excellent
technical quality. No pulmonary embolus is noted.

Again noted aneurysmal dilatation of ascending aorta measures 4.2 cm
in diameter. Stable in size in appearance from prior exam.
Descending aorta measures 3 cm in diameter.

The visualized upper abdomen is unremarkable.

Images of the lung parenchyma shows no acute infiltrate or pulmonary
edema. No pneumothorax.

Review of the MIP images confirms the above findings.
IMPRESSION: 1. No pulmonary embolus is noted.
2. No mediastinal hematoma or adenopathy.
3. Again noted aneurysmal dilatation of ascending aorta measures
cm in diameter. This is stable in size in appearance from prior
exam. Descending aorta measures 3 cm in diameter.
4. No acute infiltrate or pulmonary edema.

## 2016-11-22 IMAGING — DX DG CHEST 2V
2 series · 2 of 2 positions shown · non-contrast
Comparison: None.

CLINICAL DATA: Shortness of breath, cough 02/23/2014

EXAM:
CHEST  2 VIEW

[chest pa]
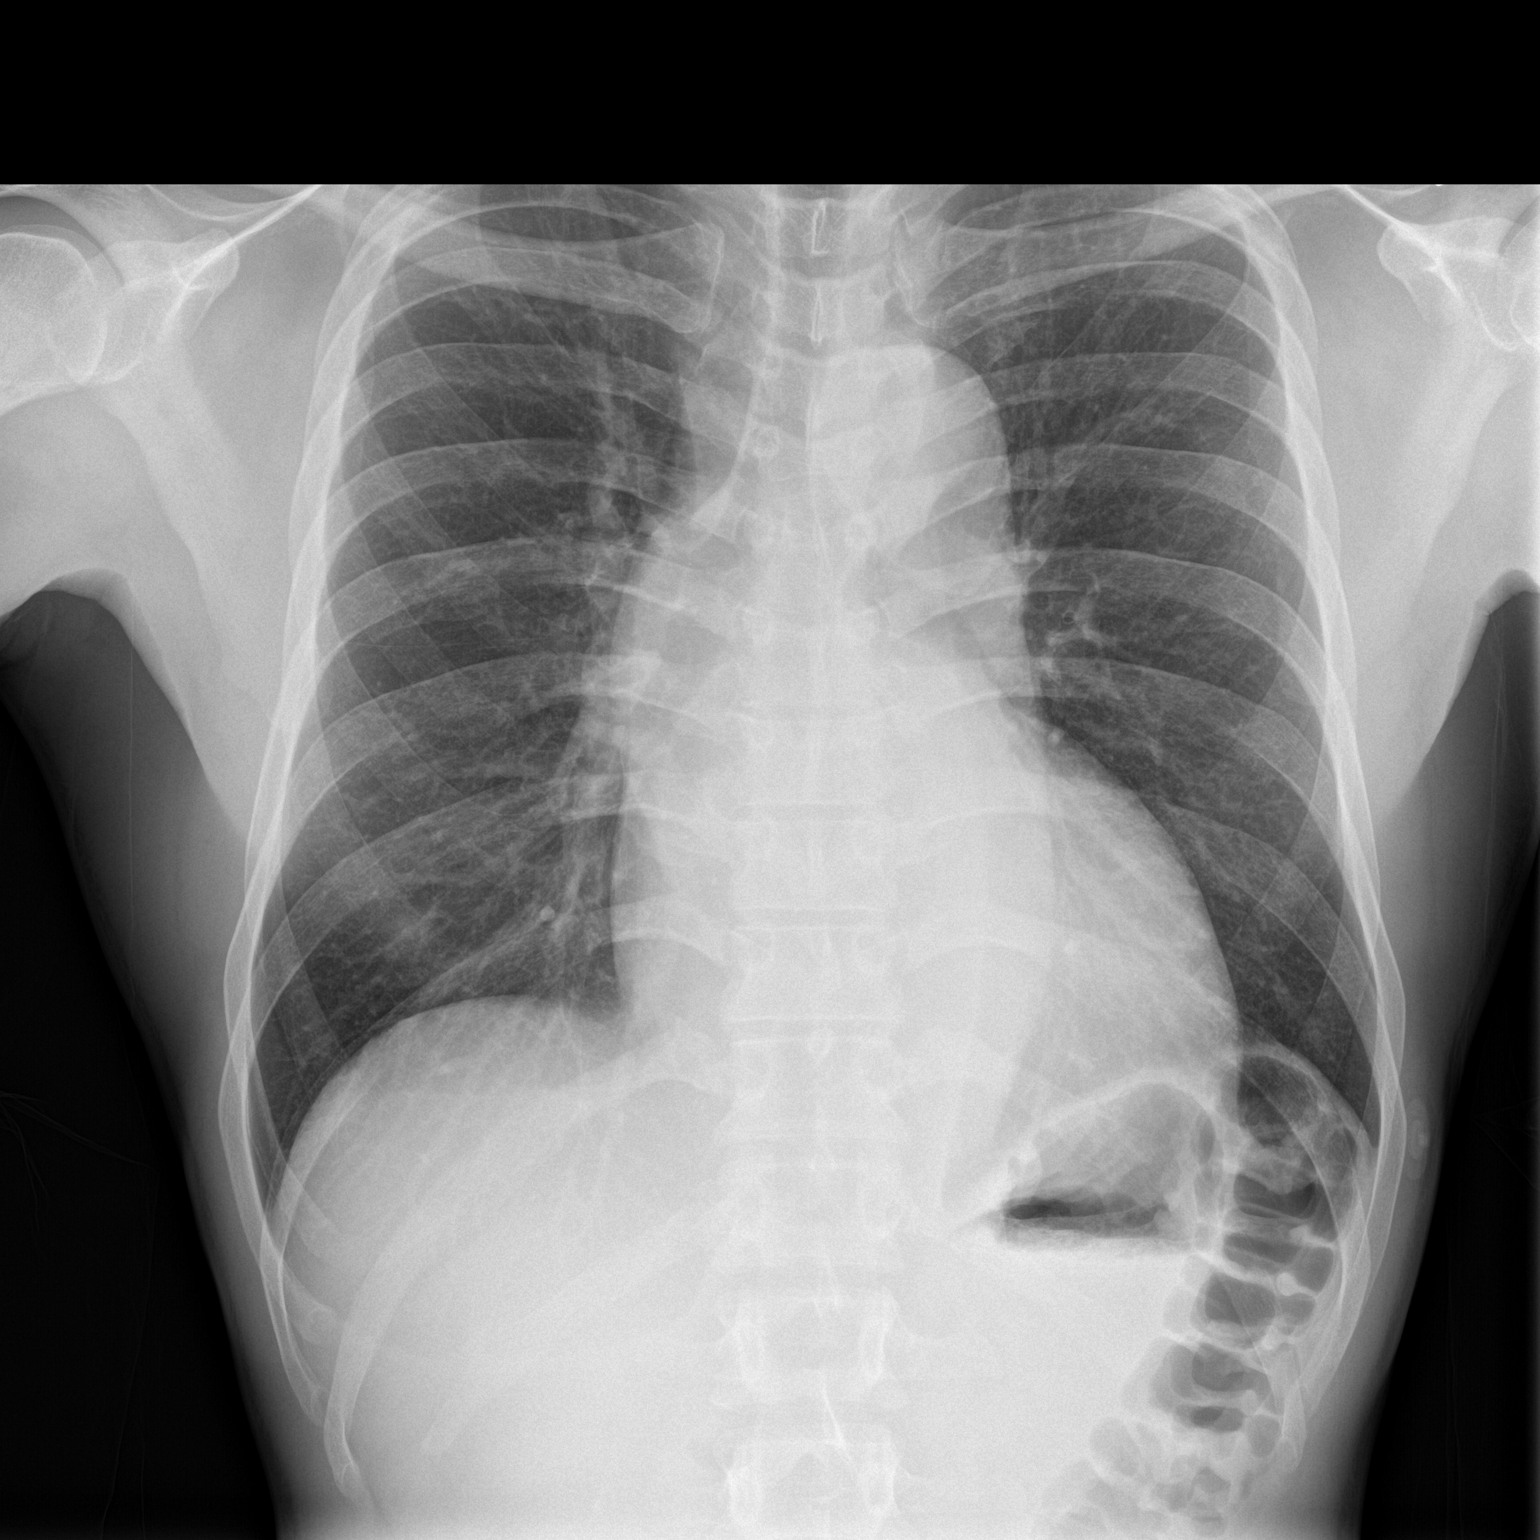

[chest lat]
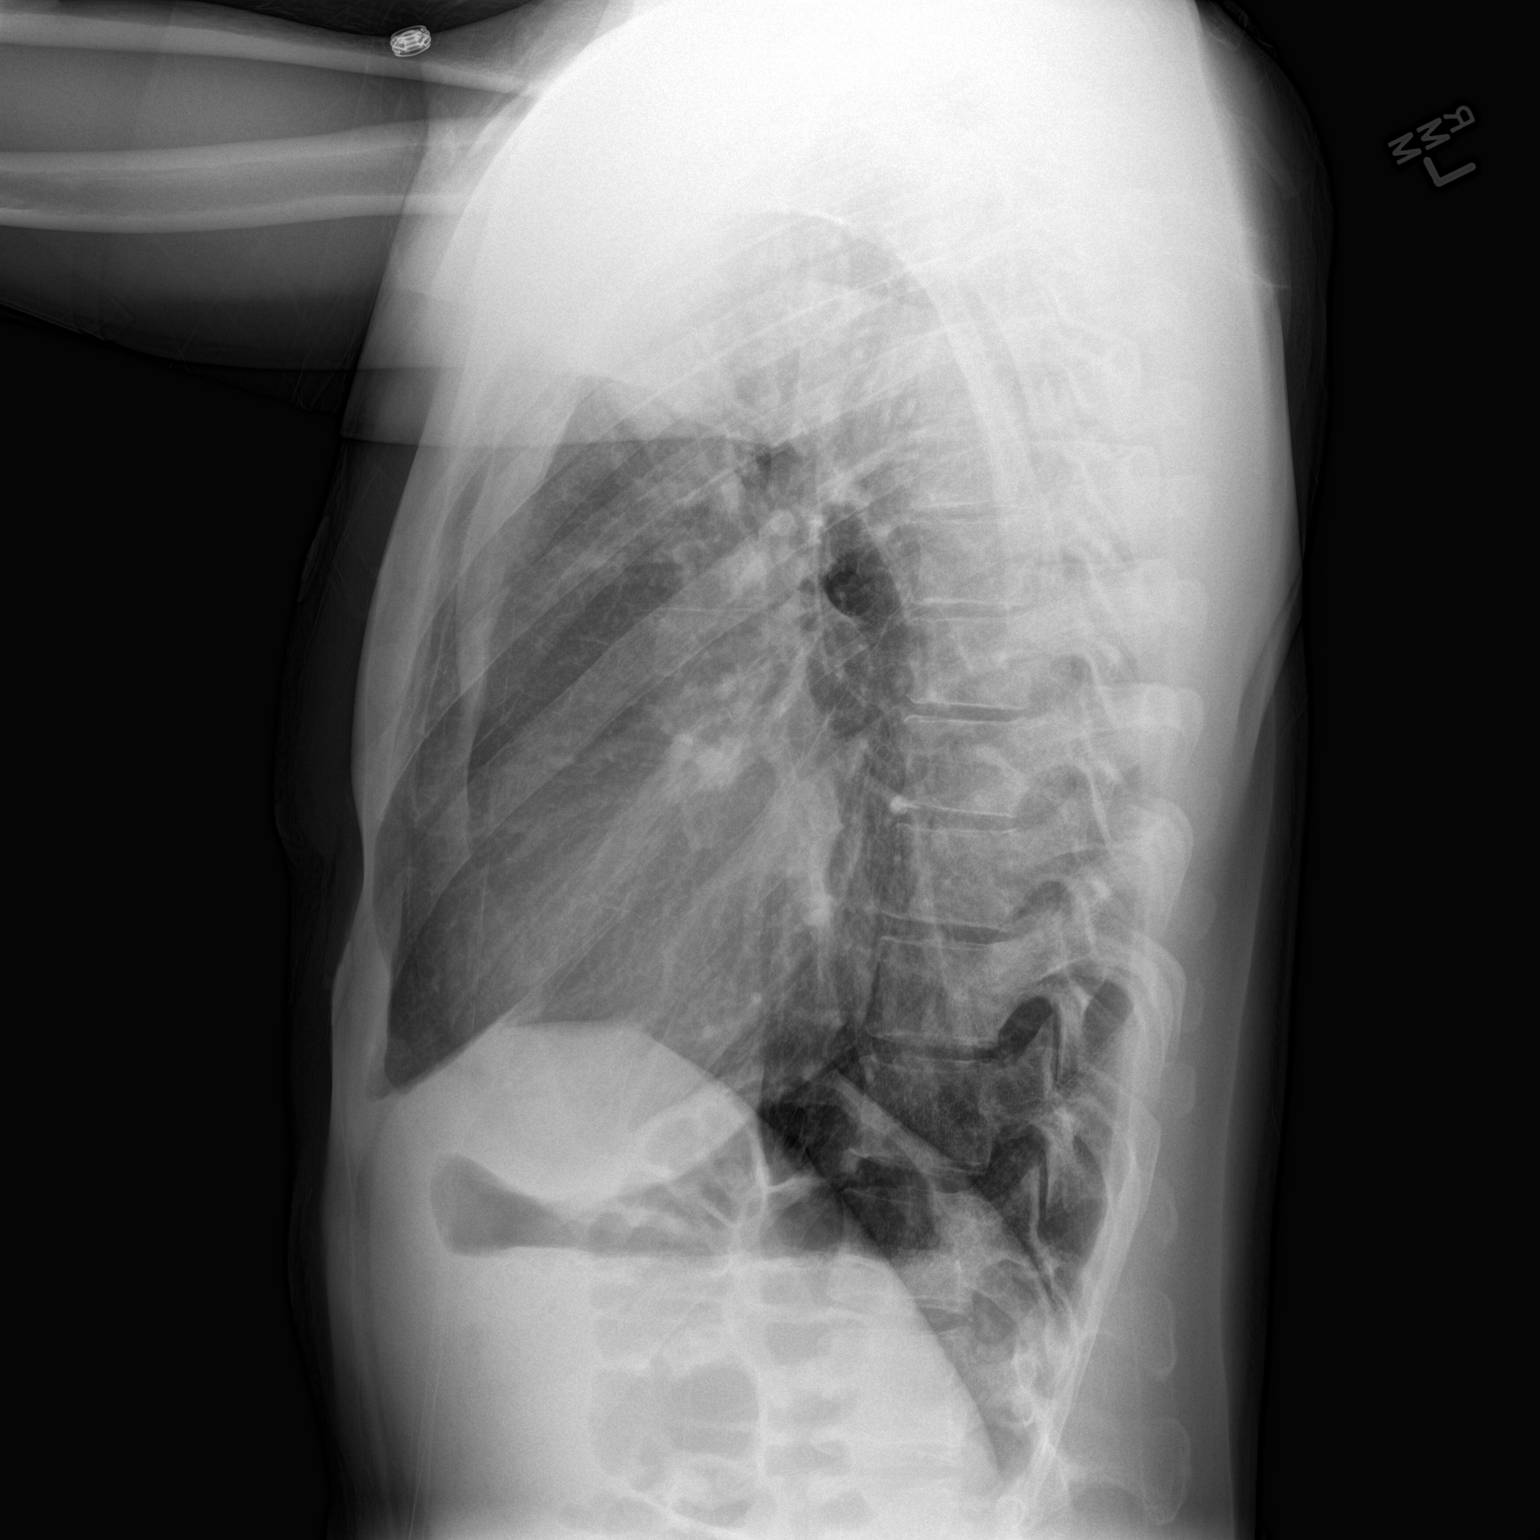

[2 of 2 positions shown; findings below may reference images not displayed]

FINDINGS: There is no focal parenchymal opacity, pleural effusion, or
pneumothorax. The heart size is top normal.

The osseous structures are unremarkable.
IMPRESSION: No active cardiopulmonary disease.

## 2016-11-23 LAB — CUP PACEART REMOTE DEVICE CHECK
Date Time Interrogation Session: 20180903214018
MDC IDC PG IMPLANT DT: 20180406

## 2016-11-26 ENCOUNTER — Encounter: Payer: Self-pay | Admitting: Speech Pathology

## 2016-11-26 NOTE — Therapy (Signed)
SPEECH THERAPY DISCHARGE SUMMARY  Visits from Start of Care: 4  Current functional level related to goals / functional outcomes: At time of last visit, pt required verbal cues for compensations for anomia, correction of written expression errors.   Remaining deficits: At time of last visit, patient presented with mild verbal and written expressive language deficits.     Education / Equipment: none Plan: Patient agrees to discharge.  Patient goals were not met. Patient is being discharged due to not returning since the last visit.  ?????       SLP Short Term Goals - 05/14/16 1633      SLP SHORT TERM GOAL #1   Title pt will write 4-5 word sentence responses 90% success with occasional min A    Time 3   Period Weeks   Status Achieved     SLP SHORT TERM GOAL #2   Title pt will complete HEP for written language with independence, procedurally   Baseline 2.8   Time 3   Period Weeks   Status On-going     SLP SHORT TERM GOAL #3   Title pt will report, subjectively, incr'd fluidity in his conversational speech compared to pre-ST   Time 3   Period Weeks   Status Achieved     SLP SHORT TERM GOAL #4   Title pt will write 2-4 sentence responses 90% success with occasional min A    Time 4   Period Weeks   Status New           SLP Long Term Goals - 05/14/16 1634      SLP LONG TERM GOAL #1   Title pt will write or text 2-4 sentence functional material with modified independence    Time 7   Period Weeks   Status Revised     SLP LONG TERM GOAL #2   Title pt will report incr'd QOL, due to better written language, than prior to ST course   Time 7   Period Weeks   Status [place] [address] [place], [place], [address] Phone: [phone]   Fax:  [phone]  Patient Details  Name: [name] MRN: [id number] Date of Birth: 06-03-1970 Referring Provider:  No ref. provider  found  Encounter Date: 11/26/2016  Deneise Lever, [place], [place] 11/26/2016, 9:23 AM  [facility] [address] [place], [place], [address] Phone: [phone]   Fax:  [phone]

## 2016-12-16 ENCOUNTER — Ambulatory Visit (INDEPENDENT_AMBULATORY_CARE_PROVIDER_SITE_OTHER): Payer: Medicare Other | Admitting: *Deleted

## 2016-12-16 DIAGNOSIS — I639 Cerebral infarction, unspecified: Secondary | ICD-10-CM

## 2016-12-17 NOTE — Progress Notes (Signed)
[facility] Summary Report / Loop Recorder 

## 2016-12-18 LAB — CUP PACEART REMOTE DEVICE CHECK
Date Time Interrogation Session: 20181003221420
Implantable Pulse Generator Implant Date: 20180406

## 2016-12-19 IMAGING — CT CT CTA ABD/PEL W/CM AND/OR W/O CM
1 of 8 series · 1 of 36 positions shown, 3 images · IV contrast (omnipaque)
Comparison: CT chest 09/08/2010.  CT abdomen and pelvis 01/24/2012

CLINICAL DATA: Chest and back pain.

EXAM:
CT ANGIOGRAPHY CHEST, ABDOMEN AND PELVIS
TECHNIQUE: Multidetector CT imaging through the chest, abdomen and pelvis was
performed using the standard protocol during bolus administration of
intravenous contrast. Multiplanar reconstructed images and MIPs were
obtained and reviewed to evaluate the vascular anatomy.
CONTRAST:  100mL OMNIPAQUE IOHEXOL 350 MG/ML SOLN

[Series 300: locator · axial · 0.68mm/px · z∈[+689,+689]mm · 1 of 1 slices shown, 3 images]
[im 1/1  mediastinal]
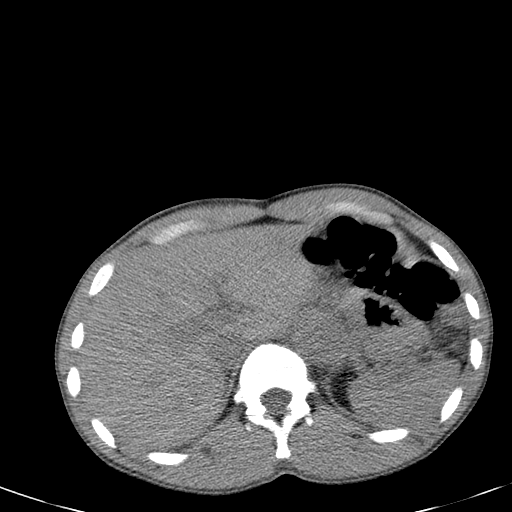
[im 1/1  lung]
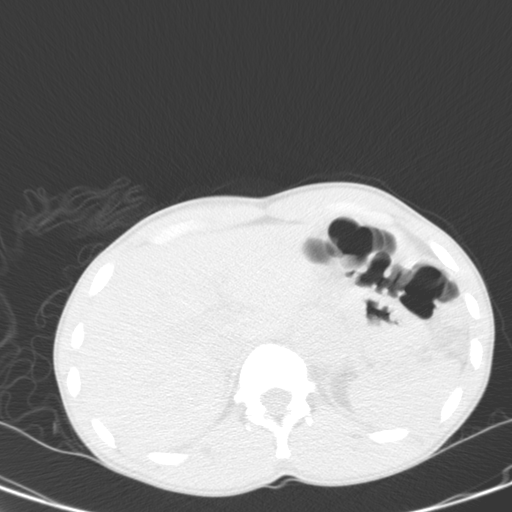
[im 1/1  bone]
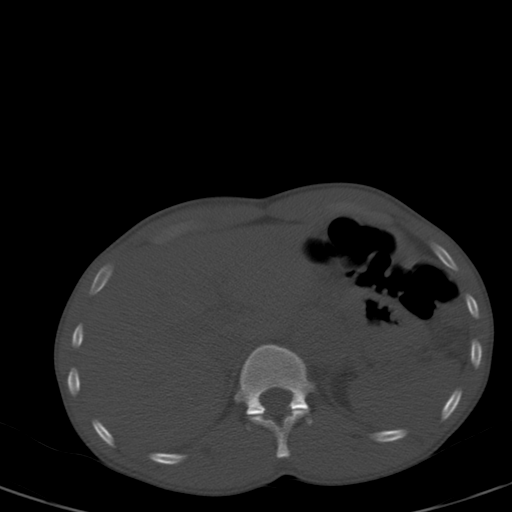

[1 of 36 positions shown; findings below may reference images not displayed]

FINDINGS: CTA CHEST FINDINGS

Noncontrast images of the chest demonstrate no significant aortic or
Coronary artery calcification. No evidence of intramural hematoma.

Images obtained during arterial phase of contrast injection
demonstrate aortic dissection arising in the posterior aortic arch
just distal to the origin of the left subclavian artery. The
dissection extends throughout the descending thoracic aorta and into
the abdomen. Flow is demonstrated in both the true and false lumens.
No evidence of dissection in the ascending aorta. Dilated ascending
aorta with maximal diameter of 4.2 cm. No abnormal mesenteric fluid
collections. Great vessel origins are patent without evidence of
dissection.

Diffuse cardiac enlargement. Esophagus is decompressed. No
significant lymphadenopathy in the chest. Atelectasis in both lung
bases. No pneumothorax. No pleural effusions.

Review of the MIP images confirms the above findings.

CTA ABDOMEN AND PELVIS FINDINGS

Aortic dissection extends from the thoracic aorta into the abdominal
aorta down to the level below the renal arteries. Dissection extends
into the base of the superior mesenteric artery. The false lumen
appears to be larger in size and have more brisk flow than the true
lumen. The celiac axis and left renal artery appear to arise from
the false lumen. The superior mesenteric artery and right renal
artery appear to arise from the true lumen. Distal abdominal aorta
and aortic bifurcation appear intact and patent with mild
calcification. No aortic aneurysm. Asymmetry of renal nephrograms
with no flow demonstrated to the left kidney. Left renal artery
thrombosis is suspected.

The liver, spleen, gallbladder, pancreas, adrenal glands, inferior
vena cava, and retroperitoneal lymph nodes are unremarkable.
Stomach, small bowel, and colon are decompressed. No free air or
free fluid in the abdomen.

Pelvis: Bladder wall is not thickened. Prostate gland is enlarged.
No free or loculated pelvic fluid collections. No pelvic mass or
lymphadenopathy. Appendix is not identified.

Bones: Normal alignment of the thoracic and lumbar spine. No
destructive bone lesions appreciated.

Review of the MIP images confirms the above findings.
IMPRESSION: Type B aortic dissection arising just distal to the origin of the
left subclavian artery and extending into the abdominal aorta below
the level of the renal arteries. Devascularization of the left
kidney. Left renal artery origin and celiac axis origin appear from
the false lumen. Superior mesenteric artery and right renal artery
origins appear from the true lumen.

These results were called by telephone at the time of interpretation
on 04/23/2014 at [DATE] to Dr. EVOL DOSS , who verbally
acknowledged these results.

## 2016-12-20 IMAGING — CR DG CHEST 1V PORT
1 series · 1 of 1 positions shown · non-contrast
Comparison: CTA of the chest performed 04/23/2014

CLINICAL DATA: Known aortic dissection. Assess for underlying
airspace disease. Subsequent encounter.

EXAM:
PORTABLE CHEST - 1 VIEW

[AP]
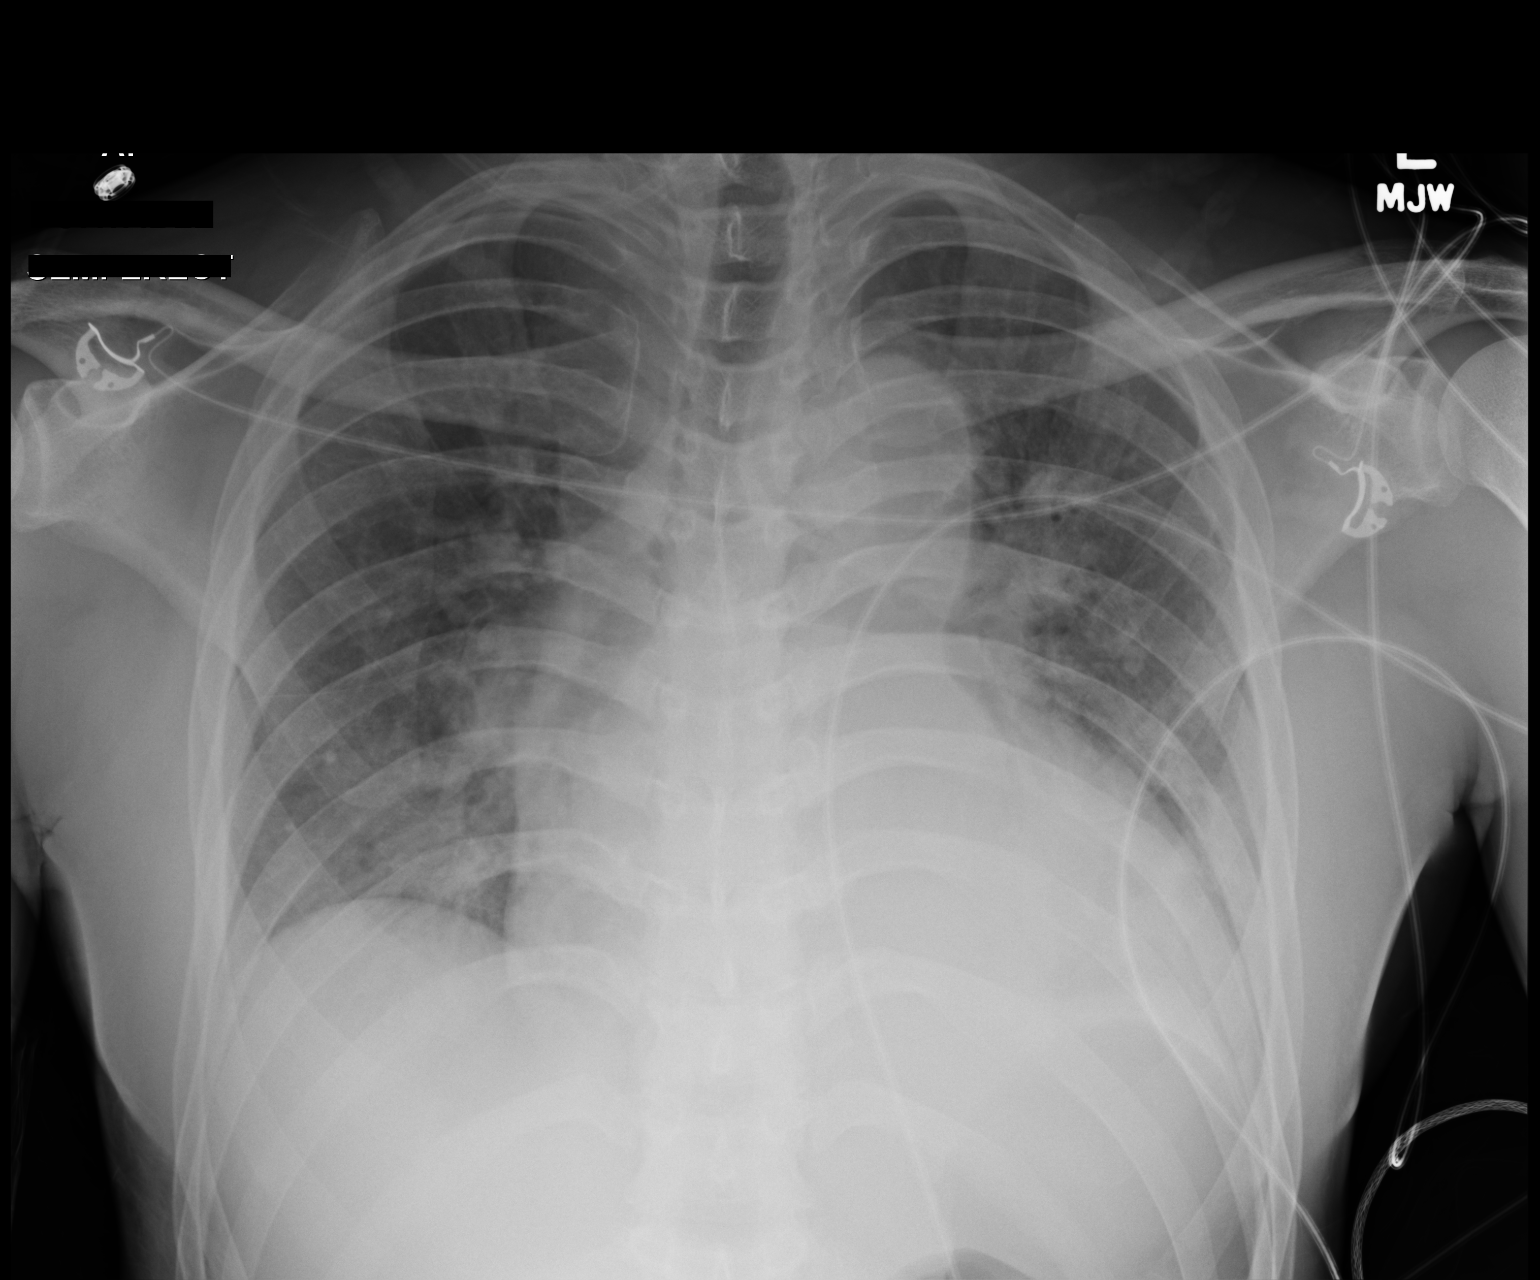

[1 of 1 positions shown; findings below may reference images not displayed]

FINDINGS: Bibasilar airspace opacities are noted, with underlying vascular
congestion, concerning for mild pulmonary edema. This is mostly new
from the prior CTA. No definite pleural effusion or pneumothorax is
seen.

The cardiomediastinal silhouette is enlarged. No acute osseous
abnormalities are identified.
IMPRESSION: 1. Bibasilar airspace opacities, with underlying vascular
congestion, concerning for mild pulmonary edema, mostly new from the
prior study.
2. Enlarged cardiomediastinal silhouette, as previously noted.

## 2016-12-21 IMAGING — CR DG CHEST 1V PORT
1 series · 1 of 1 positions shown · non-contrast
Comparison: 04/24/2014

CLINICAL DATA: Hypoxia and respiratory failure

EXAM:
PORTABLE CHEST - 1 VIEW

[AP]
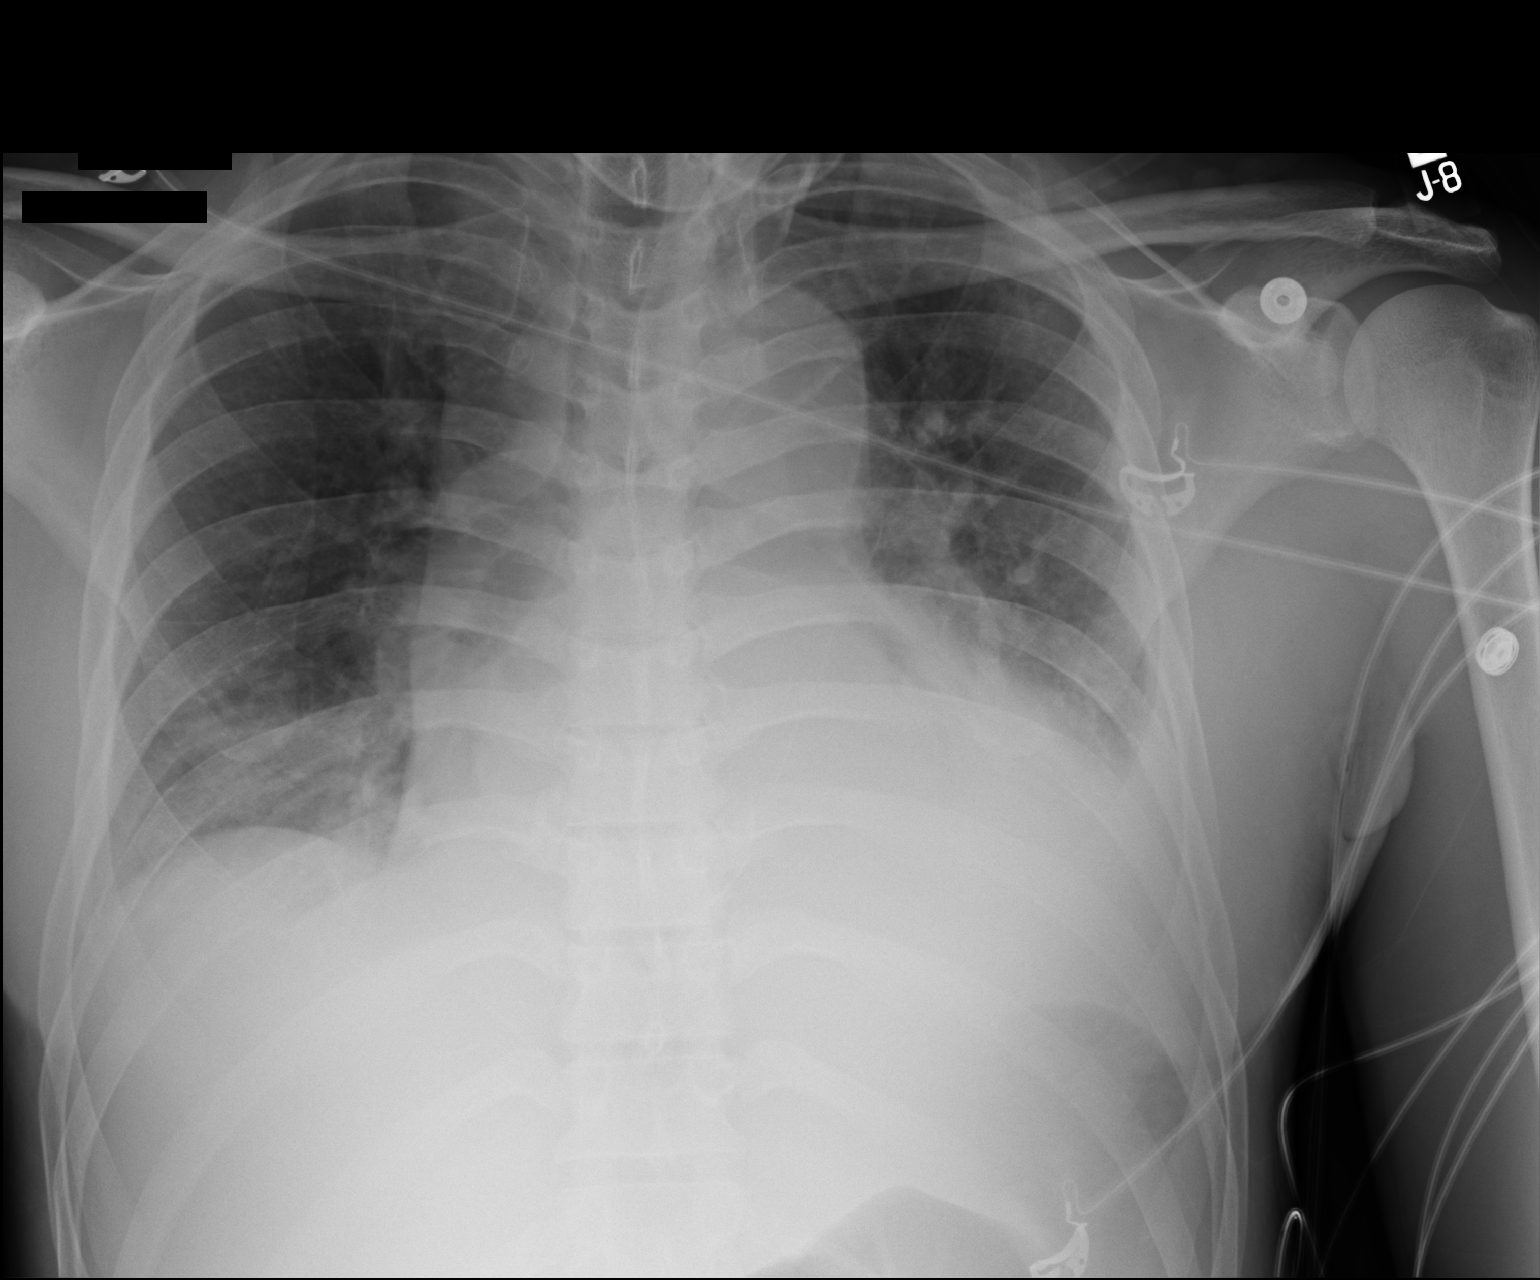

[1 of 1 positions shown; findings below may reference images not displayed]

FINDINGS: Cardiac shadow remains mildly enlarged. Mild right basilar
atelectasis is again seen. Increasing left basilar infiltrate is
noted with associated effusion. No acute bony abnormality is noted.
IMPRESSION: Increasing left basilar infiltrate with associated effusion when
compared with the prior exam.

## 2016-12-22 IMAGING — CR DG CHEST 1V PORT
1 series · 1 of 1 positions shown · non-contrast
Comparison: Portable chest x-ray April 25, 2014

CLINICAL DATA: Thoracic aortic dissection, pulmonary edema.

EXAM:
PORTABLE CHEST - 1 VIEW

[AP]
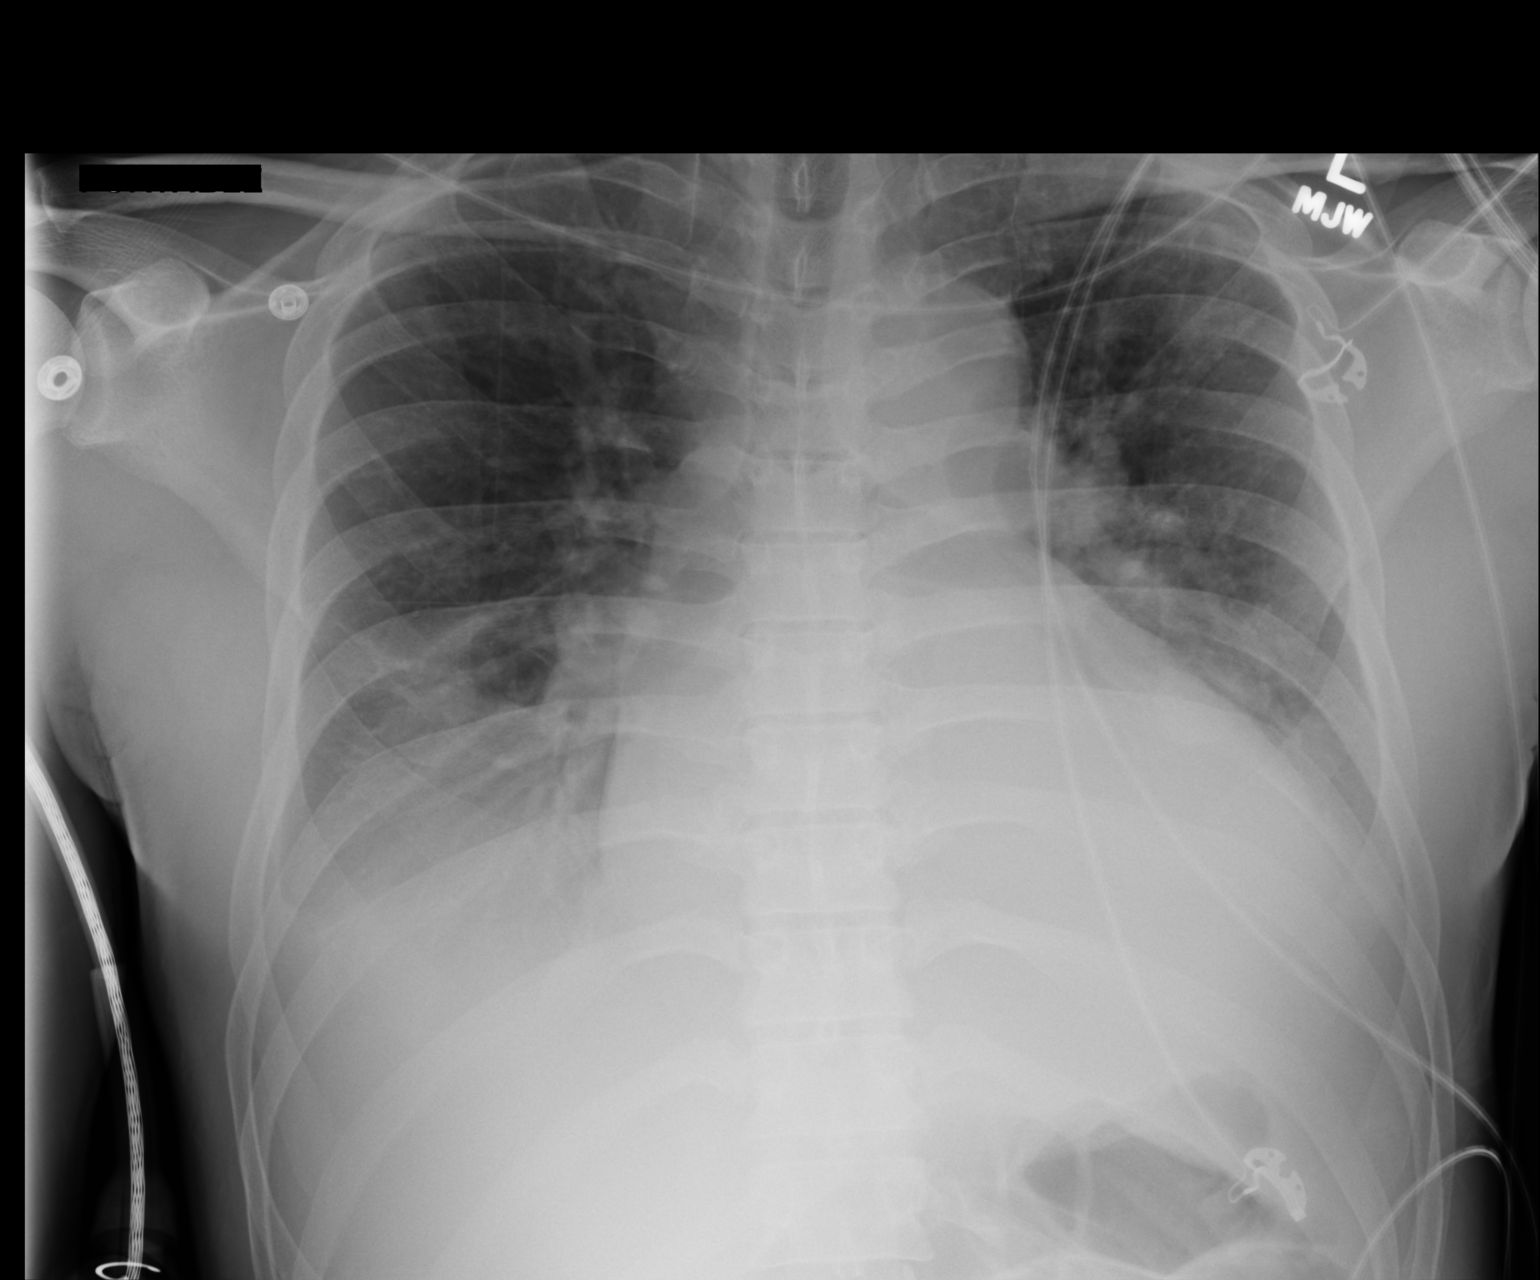

[1 of 1 positions shown; findings below may reference images not displayed]

FINDINGS: The lungs are adequately inflated. Increasing density at the right
lung base is consistent with atelectasis. A small right pleural
effusion has developed. On the left there is persistent lower lobe
atelectasis. A small left pleural effusion is suspected as well. The
cardiac silhouette is enlarged. The central pulmonary vascularity is
prominent but there is no definite cephalization. The mediastinum is
normal in width.
IMPRESSION: Increasing bibasilar atelectasis and/or pneumonia. Small bilateral
pleural effusions are present.

## 2016-12-22 IMAGING — CR DG CHEST 1V PORT
1 series · 1 of 1 positions shown · non-contrast
Comparison: 04/26/2014.

CLINICAL DATA: Respiratory failure.  Central line placement.

EXAM:
PORTABLE CHEST - 1 VIEW

[ap]
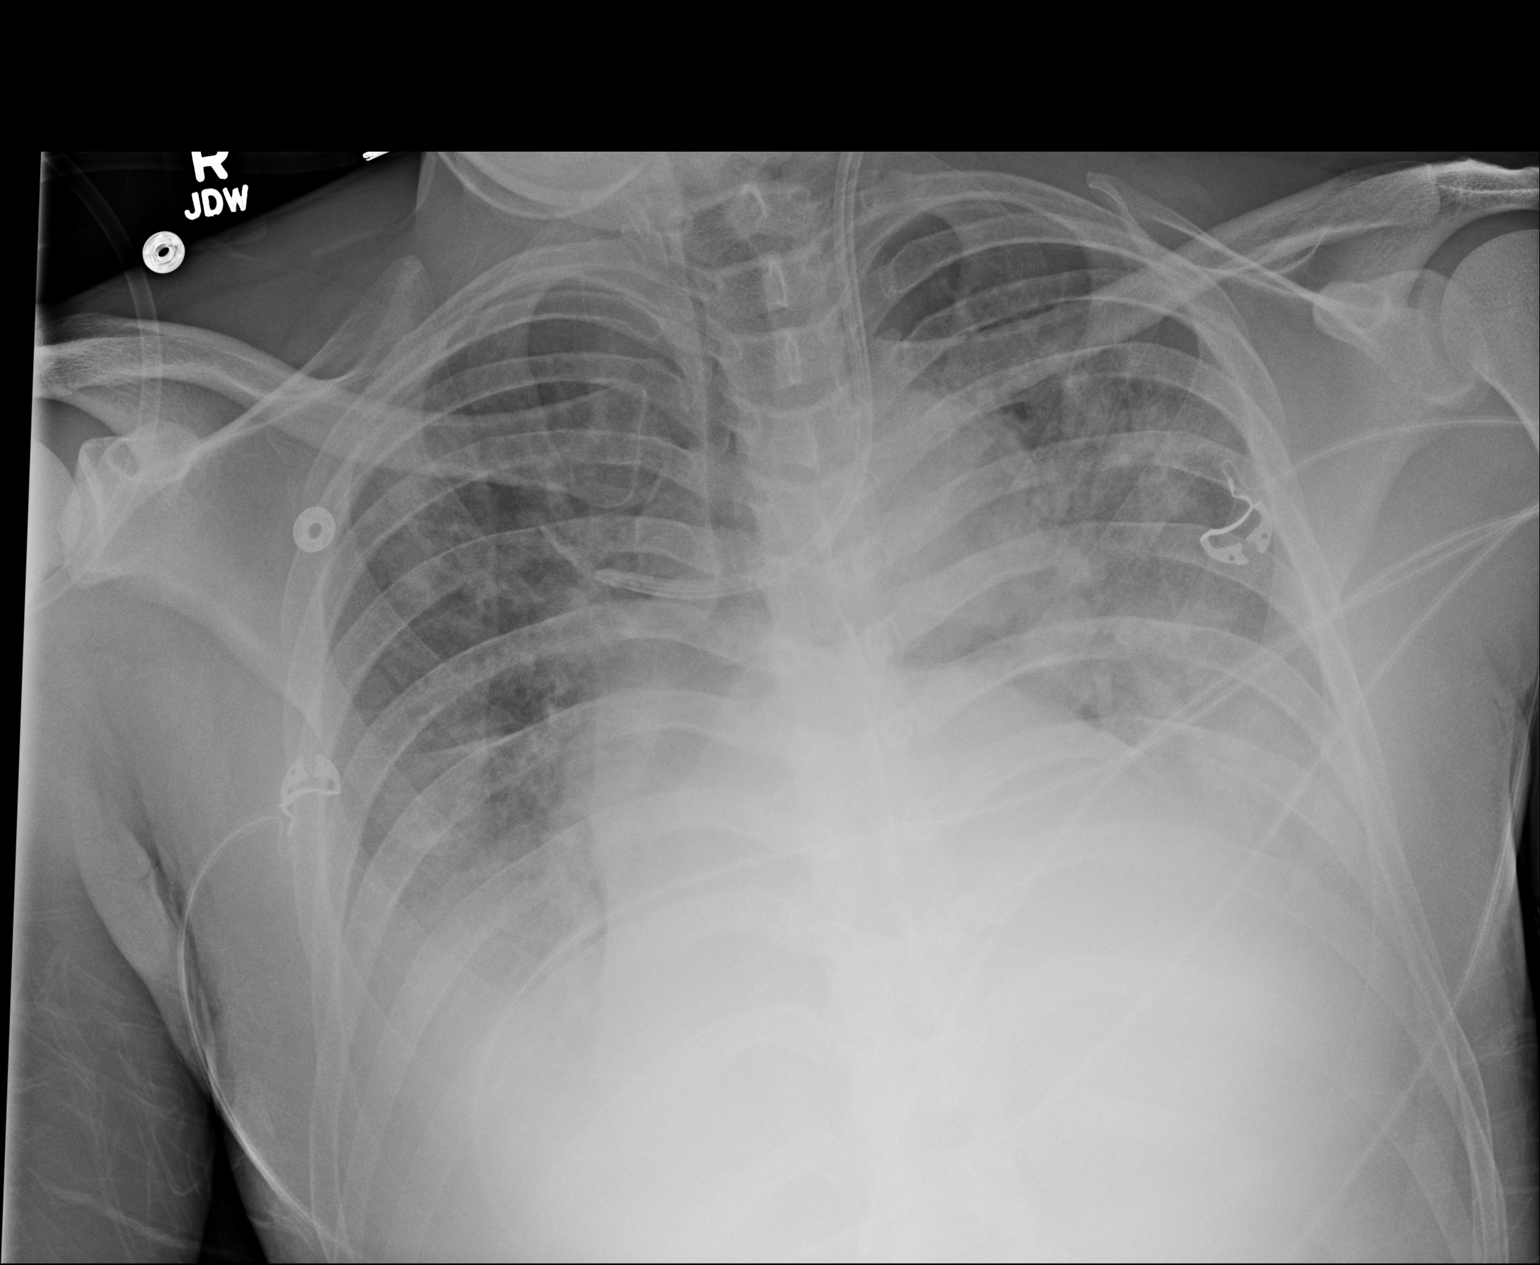

[1 of 1 positions shown; findings below may reference images not displayed]

FINDINGS: Left IJ line noted with tip projected over superior vena cava. B
stone hilar structures are stable. Stable cardiomegaly. Diffuse
progressive bilateral pulmonary alveolar infiltrates. These could be
related to pulmonary edema or pneumonia. Bilateral effusions are
present. No pneumothorax.
IMPRESSION: 1. Left IJ line with tip projected over superior vena cava.
2. Progressive bilateral pulmonary alveolar infiltrates consistent
with pulmonary edema and/or pneumonia. Associated bilateral
effusions.
3. Cardiomegaly

## 2016-12-27 ENCOUNTER — Encounter (HOSPITAL_COMMUNITY): Payer: Self-pay | Admitting: Emergency Medicine

## 2016-12-27 ENCOUNTER — Emergency Department (HOSPITAL_COMMUNITY)
Admission: EM | Admit: 2016-12-27 | Discharge: 2016-12-28 | Disposition: A | Discharge status: Home / Self Care | Payer: Medicare Other | Attending: Emergency Medicine | Admitting: Emergency Medicine

## 2016-12-27 DIAGNOSIS — I259 Chronic ischemic heart disease, unspecified: Secondary | ICD-10-CM | POA: Insufficient documentation

## 2016-12-27 DIAGNOSIS — I712 Thoracic aortic aneurysm, without rupture: Secondary | ICD-10-CM | POA: Diagnosis not present

## 2016-12-27 DIAGNOSIS — I71019 Dissection of thoracic aorta, unspecified: Secondary | ICD-10-CM

## 2016-12-27 DIAGNOSIS — R109 Unspecified abdominal pain: Secondary | ICD-10-CM | POA: Diagnosis present

## 2016-12-27 DIAGNOSIS — I7101 Dissection of thoracic aorta: Secondary | ICD-10-CM | POA: Diagnosis not present

## 2016-12-27 DIAGNOSIS — I5042 Chronic combined systolic (congestive) and diastolic (congestive) heart failure: Secondary | ICD-10-CM | POA: Insufficient documentation

## 2016-12-27 DIAGNOSIS — N183 Chronic kidney disease, stage 3 (moderate): Secondary | ICD-10-CM | POA: Insufficient documentation

## 2016-12-27 DIAGNOSIS — F17210 Nicotine dependence, cigarettes, uncomplicated: Secondary | ICD-10-CM | POA: Diagnosis not present

## 2016-12-27 DIAGNOSIS — Z7902 Long term (current) use of antithrombotics/antiplatelets: Secondary | ICD-10-CM | POA: Insufficient documentation

## 2016-12-27 DIAGNOSIS — R1084 Generalized abdominal pain: Secondary | ICD-10-CM | POA: Diagnosis not present

## 2016-12-27 DIAGNOSIS — I130 Hypertensive heart and chronic kidney disease with heart failure and stage 1 through stage 4 chronic kidney disease, or unspecified chronic kidney disease: Secondary | ICD-10-CM | POA: Insufficient documentation

## 2016-12-27 DIAGNOSIS — I714 Abdominal aortic aneurysm, without rupture: Secondary | ICD-10-CM | POA: Diagnosis not present

## 2016-12-27 LAB — COMPREHENSIVE METABOLIC PANEL
ALBUMIN: 3.4 g/dL — AB (ref 3.5–5.0)
ALK PHOS: 111 U/L (ref 38–126)
ALT: 14 U/L — AB (ref 17–63)
AST: 17 U/L (ref 15–41)
Anion gap: 8 (ref 5–15)
BILIRUBIN TOTAL: 0.7 mg/dL (ref 0.3–1.2)
BUN: 16 mg/dL (ref 6–20)
CALCIUM: 8.7 mg/dL — AB (ref 8.9–10.3)
CO2: 21 mmol/L — AB (ref 22–32)
Chloride: 103 mmol/L (ref 101–111)
Creatinine, Ser: 1.79 mg/dL — ABNORMAL HIGH (ref 0.61–1.24)
GFR calc Af Amer: 51 mL/min — ABNORMAL LOW (ref >60)
GFR calc non Af Amer: 44 mL/min — ABNORMAL LOW (ref >60)
GLUCOSE: 99 mg/dL (ref 65–99)
POTASSIUM: 3.4 mmol/L — AB (ref 3.5–5.1)
SODIUM: 132 mmol/L — AB (ref 135–145)
TOTAL PROTEIN: 7.1 g/dL (ref 6.5–8.1)

## 2016-12-27 LAB — CBC
HEMATOCRIT: 41.8 % (ref 39.0–52.0)
Hemoglobin: 14.1 g/dL (ref 13.0–17.0)
MCH: 31.5 pg (ref 26.0–34.0)
MCHC: 33.7 g/dL (ref 30.0–36.0)
MCV: 93.5 fL (ref 78.0–100.0)
Platelets: 158 10*3/uL (ref 150–400)
RBC: 4.47 MIL/uL (ref 4.22–5.81)
RDW: 14.6 % (ref 11.5–15.5)
WBC: 6.5 10*3/uL (ref 4.0–10.5)

## 2016-12-27 LAB — LIPASE, BLOOD: Lipase: 20 U/L (ref 11–51)

## 2016-12-27 IMAGING — CR DG CHEST 1V PORT
1 series · 1 of 1 positions shown · non-contrast
Comparison: Portable chest x-ray April 28, 2014

CLINICAL DATA: Hypoxia, thoracoabdominal aortic dissection, acute
renal failure. , history of aortic valve disease, CHF, and tobacco
use.

EXAM:
PORTABLE CHEST - 1 VIEW

[AP]
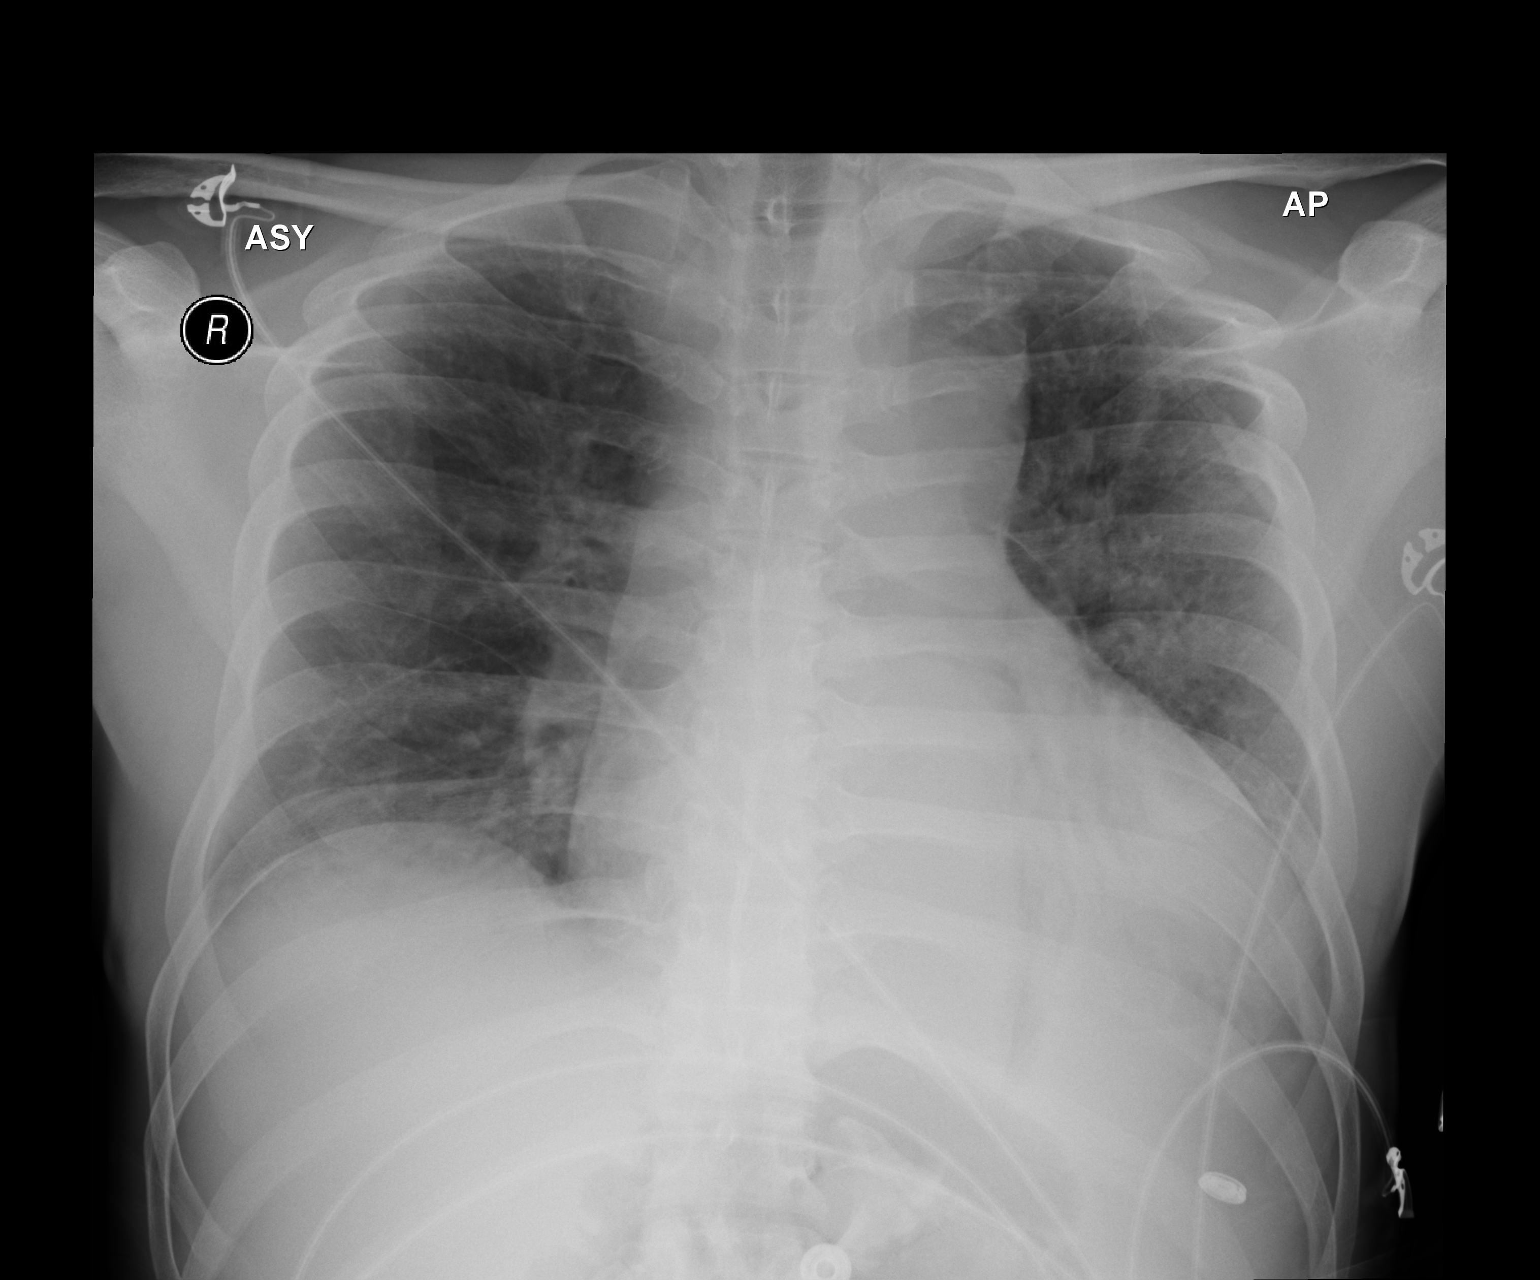

[1 of 1 positions shown; findings below may reference images not displayed]

FINDINGS: The lungs are adequately inflated. The pulmonary interstitial
markings have improved sincethe earlier study. Left lower lobe
atelectasis or infiltrate persists but has improved slightly. Small
bilateral pleural effusions are suspected but are less conspicuous
today. The cardiac silhouette remains enlarged. The mediastinum is
widened but stable. The left internal jugular venous catheter has
been removed. The bony thorax exhibits no acute abnormality.
IMPRESSION: Slight interval improvement in the left lower lobe atelectasis or
pneumonia. Small pleural effusions are less conspicuous. Mild
mediastinal widening is unchanged.

## 2016-12-27 MED ORDER — ONDANSETRON HCL 4 MG/2ML IJ SOLN
4.0000 mg | Freq: Once | INTRAMUSCULAR | Status: AC
  Administered 2016-12-27: 4 mg via INTRAVENOUS
  Filled 2016-12-27: qty 2

## 2016-12-27 MED ORDER — HYDROMORPHONE HCL 1 MG/ML IJ SOLN
1.0000 mg | Freq: Once | INTRAMUSCULAR | Status: AC
  Administered 2016-12-27: 1 mg via INTRAVENOUS
  Filled 2016-12-27: qty 1

## 2016-12-27 MED ORDER — IOPAMIDOL (ISOVUE-370) INJECTION 76%
INTRAVENOUS | Status: AC
  Administered 2016-12-28: 100 mL
  Filled 2016-12-27: qty 100

## 2016-12-27 NOTE — ED Provider Notes (Signed)
[facility] DEPT Provider Note   CSN: [id number] Arrival date & time: 12/27/16  2138     History   Chief Complaint Chief Complaint  Patient presents with  . Abdominal Pain  . thoracic aortic aneurysm    HPI [name] is a 46 y.o. male.  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 Patient reports 2 to 3 day history of worsening abdominal pain. He states that is sharp and radiates around to his back. Currently it is 10 out of 10.  He reports that he is due to have elective aortic repair in one week at [facility].  However, his pain has increased.He spoke to his surgeon at [facility] who recommended he go to the nearest ER. Denies any chest pain, shortness of breath. Does endorse nausea. No vomiting or diarrhea. Denies any weakness, numbness, tingling of the lower extremities.  Past Medical History:  Diagnosis Date  . Adenomatous colon polyp 08/2015  . Anxiety   . Aortic disease ([place])   . Aortic dissection (HCC)    a. admx 04/2014 >> L renal infarct; a/c renal failure >> b.  s/p Bioprosthetic [place] and total arch replacement and staged endovascular repair of descending aortic aneurysm ([facility] - Dr. [name])  . CAD (coronary artery disease)    a. LHC 4/16:  oD1 60%  . Cardiomyopathy ([place])    a. non-ischemic - probably related to untreated HTN and ETOH abuse - Echo 3/13 with EF 35-40% >> b. Echo 4/16: Severe LVH, EF 55-60%, moderate AI, moderate MR, mild LAE, trivial effusion, known type B dissection with communication between true and false lumens with suprasternal images suggesting dissection plane may propagate to at least left subclavian takeoff, root above aortic valve okay    . Chronic abdominal pain   . Chronic combined systolic and diastolic congestive heart failure ([place])    a. 05/2011: Adm with pulm edema/HTN urgency, EF 35-40% with  diffuse hypokinesis and moderate to severe mitral regurgitation. Cardiomyopathy likely due to uncontrolled HTN and ETOH abuse - cath deferred due to renal insufficiency (felt due to uncontrolled HTN). bJodie Echevaria MV 06/2011: EF 37% and no ischemia or infarction. c. EF 45-50% by echo 01/2012.  [name] Chronic sinusitis   . CKD (chronic kidney disease)    a. 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  peak creatinine 3.46 during admx for aortic dissection 2/16.  sees Dr [name]  . Colon polyps 09/11/2015   Descending, sigmoid polyps  . DDD (degenerative disc disease), lumbar   . Depression   . Descending thoracic aortic aneurysm ([place])   . Dissecting aneurysm of thoracic aorta ([place])   . ETOH abuse    a. Reported to have quit 05/2011.  . Frequent headaches   . GERD (gastroesophageal reflux disease)   . Headache(784.0)    "q other day" (08/08/2013)  . Heart murmur   . Hemorrhoid thrombosis   . History of echocardiogram    Echo 1/17:  Severe LVH, EF 55-60%, no RWMA, Gr 2 DD, AVR ok, mild to mod MR, mild LAE, mild reduced RVSF, mod RAE  . History of medication noncompliance   . HYPERLIPIDEMIA   . Hypertension    a. Hx of HTN urgency secondary to noncompliance. b. urinary metanephrine and catecholeamine levels normal 2013.  c. Renal art [name] 1/16:  No evidence of renal artery stenosis noted bilaterally.  . INGUINAL HERNIA   .  Pneumonia ~ 2013  . Renal insufficiency   . Stroke ([place])   . Tobacco abuse   . Valvular heart disease    a. Echo 05/2011: moderate to severe eccentric MR and mild to moderate AI with prolapsing left coronary cusp. b. Echo 01/2012: mild-mod AI, mild dilitation of aortic root, mild MR.;  c. Echo 1/16: Severe LVH consistent with hypertrophic cardio myopathy, EF 50%, no RWMA, mod AI, mild MR, mild RAE, dilated Ao root (40 mm);     Patient Active Problem List   Diagnosis Date Noted  . Cerebral infarction due to embolism of left middle cerebral artery ([place]) 06/03/2016  . History of aortic  dissection 06/03/2016  . Palpitation   . Cerebrovascular accident (CVA) due to embolism of left middle cerebral artery ([place])   . Seizures ([place]) 02/28/2016  . HLD (hyperlipidemia) 08/28/2015  . 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[place]) 06/07/2015  . Cardiomyopathy ([place]) 03/01/2015  . Acid reflux 03/01/2015  . Essential hypertension 03/01/2015  . Cardiac chest pain 12/10/2014  . Abdominal pain 10/12/2014  . S/P aortic dissection repair 10/03/2014  . H/O aortic valve replacement 10/03/2014  . CKD (chronic kidney disease) 05/15/2014  . AKI (acute kidney injury) ([place])   . Aortic dissection, thoracoabdominal ([place])   . Dissecting aneurysm of thoracic aorta, Stanford type B ([place]) 04/24/2014  . Aortic dissection, thoracic ([place]) 04/24/2014  . Aortic dissection ([place]) 04/23/2014  . Aneurysm of ascending aorta (HCC) 03/28/2014  . Dyspnea 03/27/2014  . ETOH abuse 09/20/2013  . Malignant hypertension 02/20/2013  . Hypertensive urgency 01/27/2012  . Cardiomyopathy, hypertensive ([place]) 01/26/2012  . AI (aortic insufficiency) 01/26/2012  . MR (mitral regurgitation) 01/26/2012  . Acute renal failure superimposed on stage 3 chronic kidney disease ([place]) 01/26/2012  . Systolic CHF, chronic ([place]) 06/10/2011  . Chronic bronchitis 05/23/2011  . Cardiomegaly - hypertensive 05/22/2011  . Marijuana use 05/22/2011  . Abdominal pain 05/22/2011  . Hypertension, malignant 05/22/2011  . Anxiety and depression 05/22/2011  . Hyperlipidemia LDL goal <70 08/26/2009  . Tobacco use 08/26/2009  . Headache(784.0) 08/26/2009  . Aortic valve disorder 03/11/2009  . INGUINAL HERNIA 02/18/2009  . Uncontrolled hypertension 01/16/2009    Past Surgical History:  Procedure Laterality Date  . ANKLE SURGERY Bilateral    Fractures bilaterally  . AORTIC VALVE SURGERY  09/2014  . FOOT FRACTURE SURGERY Bilateral 2004-2010   "got pins in both of them"  . HEMORRHOID SURGERY N/A 06/15/2015   Procedure: HEMORRHOIDECTOMY;   Surgeon: Stark Klein, MD;  Location: [facility];  Service: General;  Laterality: N/A;  . INGUINAL HERNIA REPAIR Right ~ 1996  . LEFT HEART CATHETERIZATION WITH CORONARY ANGIOGRAM N/A 06/21/2014   Procedure: LEFT HEART CATHETERIZATION WITH CORONARY ANGIOGRAM;  Surgeon: Larey Dresser, MD;  Location: [facility] CATH LAB;  Service: Cardiovascular;  Laterality: N/A;  . LOOP RECORDER INSERTION N/A 06/19/2016   Procedure: Loop Recorder Insertion;  Surgeon: Deboraha Sprang, MD;  Location: [facility] CV LAB;  Service: Cardiovascular;  Laterality: N/A;  . TEE WITHOUT CARDIOVERSION N/A 03/12/2016   Procedure: TRANSESOPHAGEAL ECHOCARDIOGRAM (TEE);  Surgeon: Sanda Klein, MD;  Location: [facility] ENDOSCOPY;  Service: Cardiovascular;  Laterality: N/A;       Home Medications    Prior to Admission medications   Medication Sig Start Date End Date Taking? Authorizing Provider  acetaminophen (TYLENOL) 500 MG tablet Take 1,000 mg by mouth every 8 (eight) hours as needed for mild pain or moderate pain.    Yes [provider]  amLODipine (  NORVASC) 10 MG tablet Take 10 mg by mouth at bedtime.   Yes [provider]  atorvastatin (LIPITOR) 20 MG tablet Take 1 tablet (20 mg total) by mouth daily. 03/18/16  Yes Hilty, Nadean Corwin, MD  DULoxetine (CYMBALTA) 30 MG capsule Take 30 mg by mouth daily.   Yes [provider]  famotidine (PEPCID) 20 MG tablet Take 1 tablet (20 mg total) by mouth 2 (two) times daily. 08/11/16  Yes Isla Pence, MD  hydrALAZINE (APRESOLINE) 50 MG tablet Take 1 tablet (50 mg total) by mouth 2 (two) times daily. 05/01/16  Yes Golden Circle, FNP  HYDROcodone-acetaminophen (NORCO/VICODIN) 5-325 MG tablet Take 1 tablet by mouth every 4 (four) hours as needed. Patient taking differently: Take 1 tablet by mouth every 4 (four) hours as needed for moderate pain.  08/11/16  Yes Isla Pence, MD  labetalol (NORMODYNE) 300 MG tablet Take 2 tablets (600 mg total) by mouth 2 (two) times  daily. Patient taking differently: Take 300 mg by mouth 2 (two) times daily.  12/14/14  Yes Weaver, Scott T, PA-C  levETIRAcetam (KEPPRA) 500 MG tablet Take 1 tablet (500 mg total) by mouth 2 (two) times daily. 03/06/16  Yes Verlee Monte, MD  meclizine (ANTIVERT) 25 MG tablet Take 1 tablet 3 times a day by oral route as needed for dizzyness   Yes [provider]  pantoprazole (PROTONIX) 40 MG tablet Take 1 tablet (40 mg total) by mouth daily. Overdue Follow-up appt is due must see provider for refills 09/28/16  Yes Golden Circle, FNP  clopidogrel (PLAVIX) 75 MG tablet Take 1 tablet (75 mg total) by mouth daily. 03/07/16   Verlee Monte, MD  oxyCODONE-acetaminophen (PERCOCET/ROXICET) 5-325 MG tablet Take 1 tablet by mouth every 6 (six) hours as needed for severe pain. 12/28/16   Doss Cybulski, Barbette Hair, MD    Family History Family History  Problem Relation Age of Onset  . Hypertension Mother   . Hypertension Unknown   . Colon cancer Paternal Uncle   . Stroke Maternal Aunt   . Heart attack Brother   . Diabetes Maternal Aunt   . Lung cancer Maternal Uncle   . Throat cancer Neg Hx   . Pancreatic cancer Neg Hx   . Esophageal cancer Neg Hx   . Kidney disease Neg Hx   . Liver disease Neg Hx     Social History Social History  Substance Use Topics  . Smoking status: Current Some Day Smoker    Packs/day: 0.25    Years: 23.00    Types: Cigarettes  . Smokeless tobacco: Never Used     Comment: 3 to 4 per day  . Alcohol use 0.6 oz/week    1 Cans of beer per week     Comment: occasionally     Allergies   Imdur [isosorbide nitrate] and Tramadol   Review of Systems Review of Systems  Constitutional: Negative for fever.  Respiratory: Negative for shortness of breath.   Cardiovascular: Negative for chest pain.  Gastrointestinal: Positive for abdominal pain and nausea. Negative for vomiting.  Neurological: Negative for weakness and numbness.  All other systems reviewed and are  negative.    Physical Exam Updated Vital Signs BP (!) 163/100   Pulse 81   Temp 98.3 F (36.8 C) (Oral)   Resp (!) 21   Ht 6' (1.829 m)   Wt 68.9 kg (152 lb)   SpO2 95%   BMI 20.61 kg/m   Physical Exam  Constitutional: He  is oriented to person, place, and time.  Chronically ill-appearing appears older than stated age, tearful  HENT:  Head: Normocephalic and atraumatic.  Cardiovascular: Normal rate and regular rhythm.   Murmur heard. Systolic ejection murmur  Pulmonary/Chest: Effort normal and breath sounds normal. No respiratory distress. He has no wheezes.  Abdominal: Soft. Bowel sounds are normal. There is tenderness. There is no rebound.  Diffuse tenderness to palpation, no palpable mass  Musculoskeletal: He exhibits no edema.  2+ DP pulse bilaterally  Neurological: He is alert and oriented to person, place, and time.  Skin: Skin is warm and dry.  Psychiatric: He has a normal mood and affect.  Nursing note and vitals reviewed.    ED Treatments / Results  Labs (all labs ordered are listed, but only abnormal results are displayed) Labs Reviewed  COMPREHENSIVE METABOLIC PANEL - Abnormal; Notable for the following:       Result Value   Sodium 132 (*)    Potassium 3.4 (*)    CO2 21 (*)    Creatinine, Ser 1.79 (*)    Calcium 8.7 (*)    Albumin 3.4 (*)    ALT 14 (*)    GFR calc non Af Amer 44 (*)    GFR calc Af Amer 51 (*)    All other components within normal limits  URINALYSIS, ROUTINE W REFLEX MICROSCOPIC - Abnormal; Notable for the following:    Protein, ur 100 (*)    All other components within normal limits  LIPASE, BLOOD  CBC    EKG  EKG Interpretation  Date/Time:  Monday December 28 2016 00:06:05 EDT Ventricular Rate:  77 PR Interval:    QRS Duration: 101 QT Interval:  418 QTC Calculation: 474 R Axis:   -7 Text Interpretation:  Sinus rhythm Left atrial enlargement LVH with secondary repolarization abnormality Deep T waves laterally Confirmed by  Thayer Jew [phone]) on 12/28/2016 12:43:43 AM       Radiology Ct Angio Chest/abd/pel For Dissection W And/or Wo Contrast  Result Date: 12/28/2016 CLINICAL DATA:  Follow-up of known thoracoabdominal aortic aneurysm. EXAM: CT ANGIOGRAPHY CHEST, ABDOMEN AND PELVIS TECHNIQUE: Multidetector CT imaging through the chest, abdomen and pelvis was performed using the standard protocol during bolus administration of intravenous contrast. Multiplanar reconstructed images and MIPs were obtained and reviewed to evaluate the vascular anatomy. CONTRAST:  100 mL Isovue 370 COMPARISON:  08/11/2016 FINDINGS: CTA CHEST FINDINGS Cardiovascular: Noncontrast CT images of the chest demonstrate postoperative changes with median sternotomy. Apparent aortic valve replacement with ascending, arch, and descending aortic stent graft. No intramural hematoma. Scattered vascular calcifications. Images obtained during the arterial phase following contrast administration demonstrate patency of the thoracic aorta and great vessels. The lumen of the stent graft is patent. The false lumen/ aneurysm sac is thrombosed. No evidence of endo leak. Visualized central pulmonary arteries appear patent. No large central pulmonary embolus. Mild cardiac enlargement. No pericardial effusion. Mediastinum/Nodes: Esophagus is decompressed. No significant lymphadenopathy in the chest. Scattered lymph nodes are likely reactive and are unchanged since previous study. Lungs/Pleura: There is atelectasis in the lung bases. No focal consolidation or edema. No pleural effusions. No pneumothorax. Airways are patent. Musculoskeletal: Sternotomy wires. Normal alignment of the thoracic spine. No vertebral compression deformities. No destructive bone lesions. Review of the MIP images confirms the above findings. CTA ABDOMEN AND PELVIS FINDINGS VASCULAR Aorta: Tortuous and ectatic aorta. The stent graft terminates that the level of the low descending thoracic aorta  just above the abdominal hiatus. Below  this level, there is persistent aneurysmal dissection of the upper thoracic aorta with aneurysmal dilatation at the level of the celiac axis measuring 4.5 x 6.1 cm. This measurement is increased since the previous study. Direct correlation is limited since the previous study was performed without contrast. The infrarenal abdominal aorta is tortuous with normal caliber. The dissection ends just below the level of the renal artery origins. Celiac: The celiac axis arises from the false limb and and is patent. SMA: The superior mesenteric artery arises from the true lumen and is patent. Renals: Single right renal artery arises from the true lumen and is patent. There is a stent or graft connecting the single left renal artery to the true lumen. The left renal artery is patent. IMA: The inferior mesenteric artery arises from the true lumen below the level of the dissection and is patent. Inflow: Left iliac artery aneurysm measuring 2 cm diameter. No change since previous study. Right iliac artery aneurysm measuring 1.6 cm diameter. No change since previous study. Calcification of the common iliac, internal and external iliac artery's. Calcific and non calcific stenosis of the origins of the internal iliac artery's bilaterally but the vessels remain patent. Veins: No obvious venous abnormality within the limitations of this arterial phase study. Review of the MIP images confirms the above findings. NON-VASCULAR Hepatobiliary: No focal liver abnormality is seen. No gallstones, gallbladder wall thickening, or biliary dilatation. Pancreas: Unremarkable. No pancreatic ductal dilatation or surrounding inflammatory changes. Spleen: Normal in size without focal abnormality. Adrenals/Urinary Tract: Adrenal glands are unremarkable. Kidneys are normal, without renal calculi, focal lesion, or hydronephrosis. Bladder is unremarkable. Stomach/Bowel: Stomach, small bowel, and colon are mostly  decompressed. Scattered stool throughout the colon. No small or large bowel distention or inflammatory changes identified. Appendix is not definitively visualized. Lymphatic: No significant lymphadenopathy. Reproductive: Prostate gland is enlarged, measuring 5.5 cm diameter. Other: No free air or free fluid in the abdomen. No retroperitoneal hematomas. Musculoskeletal: No acute or significant osseous findings. Review of the MIP images confirms the above findings. IMPRESSION: 1. Postoperative changes with aortic valve replacement and stent graft of the thoracic aorta. Graft appears patent. False lumen/aneurysm sac is thrombosed. No endoleak identified. 2. No evidence of active pulmonary disease. 3. Residual aneurysm and dissection of the upper abdominal aorta with dissection extending to the level just below the renal artery origins. Aortic diameter at the level of the celiac artery origin is 4.5 x 6.1 cm, increased since previous study. Persistent flow in the true and false lumens at this level. No evidence of aneurysm rupture or retroperitoneal hematoma. 4. The celiac axis arises from the false lumen and is patent. Major branch vessels of the abdominal aorta otherwise arise from the true lumen and are patent. 5. Atherosclerotic disease causes narrowing of the internal iliac artery's bilaterally. 6. Bilateral iliac artery aneurysms, measuring 1.6 cm on the right and 2 cm on the left. 7. No acute process is identified involving the internal abdominal organs. Prostate gland is enlarged. Electronically Signed   By: Lucienne Capers M.D.   On: 12/28/2016 00:49    Procedures Procedures (including critical care time)  Medications Ordered in ED Medications  oxyCODONE-acetaminophen (PERCOCET/ROXICET) 5-325 MG per tablet 1 tablet (not administered)  HYDROmorphone (DILAUDID) injection 1 mg (1 mg Intravenous Given 12/27/16 2351)  ondansetron (ZOFRAN) injection 4 mg (4 mg Intravenous Given 12/27/16 2352)  iopamidol  (ISOVUE-370) 76 % injection (100 mLs  Contrast Given 12/28/16 0014)  HYDROmorphone (DILAUDID) injection 1 mg (1 mg  Intravenous Given 12/28/16 0149)     Initial Impression / Assessment and Plan / ED Course  I have reviewed the triage vital signs and the nursing notes.  Pertinent labs & imaging results that were available during my care of the patient were reviewed by me and considered in my medical decision making (see chart for details).     Patient presents with abdominal pain. History of aortic arch repair but persistent chronic thoracic dissection. He is nontoxic on exam. He is hypertensive but reports that this is his normal. He is otherwise nontoxic-appearing. He has diffuse tenderness to palpation without rebound or guarding. Basic labwork obtained. Renal function at baseline and lab work is largely reassuring. Repeat CT angiogram obtained. Radiology read greatest diameter of 4.5 x 6.1 cm. No rupture noted. Chronic thrombus. I have reviewed his notes from [place]. Report his largest diameter of 5.4 cm in early September. He is due to have surgical repair in one week. Consulted with his cardiothoracic surgeon at [facility], Dr. [name]. He reports that the patient has a history of chronic abdominal pain and that likely his CT scan is not much changed.It is difficult to compare as I do not have his images available. Without rupture he would not emergently take to the OR and he does not feel he needs emergent transport. He does recommend giving the patient a disc of his images and having patient come to clinic later today for review. Given that patient's vital signs are stable and lab work as well as physical exam is reassuring, feel this is reasonable.  There is no evidence of rupture at this time and patient is appropriately scheduled for definitive repair. Will discharge with a short course of pain medication. [place] narcotic database is reviewed. No recent prescriptions (last in May).  I discussed this with the  patient and his family. He was also given strict return precautions including abrupt or worsening increasing pain.  After history, exam, and medical workup I feel the patient has been appropriately medically screened and is safe for discharge home. Pertinent diagnoses were discussed with the patient. Patient was given return precautions.   Final Clinical Impressions(s) / ED Diagnoses   Final diagnoses:  Generalized abdominal pain  Chronic thoracic aortic dissection (HCC)    New Prescriptions New Prescriptions   OXYCODONE-ACETAMINOPHEN (PERCOCET/ROXICET) 5-325 MG TABLET    Take 1 tablet by mouth every 6 (six) hours as needed for severe pain.     Merryl Hacker, MD 12/28/16 [phone]

## 2016-12-27 NOTE — ED Triage Notes (Signed)
PT reports severe abd pain with nausea that radiates around L side to back x 3 days.  Reports known descending thoracic aortic aneurysm that he is scheduled to have surgery on at [facility] in 1 week.

## 2016-12-27 NOTE — ED Notes (Signed)
Pt still in triage rm3

## 2016-12-28 ENCOUNTER — Emergency Department (HOSPITAL_COMMUNITY): Payer: Medicare Other

## 2016-12-28 ENCOUNTER — Encounter (HOSPITAL_COMMUNITY): Payer: Self-pay | Admitting: Emergency Medicine

## 2016-12-28 ENCOUNTER — Emergency Department (HOSPITAL_COMMUNITY)
Admission: EM | Admit: 2016-12-28 | Discharge: 2016-12-28 | Disposition: A | Discharge status: Home / Self Care | Payer: Medicare Other | Source: Home / Self Care | Attending: Emergency Medicine | Admitting: Emergency Medicine

## 2016-12-28 ENCOUNTER — Encounter (HOSPITAL_COMMUNITY): Payer: Self-pay | Admitting: Radiology

## 2016-12-28 DIAGNOSIS — F329 Major depressive disorder, single episode, unspecified: Secondary | ICD-10-CM | POA: Insufficient documentation

## 2016-12-28 DIAGNOSIS — I2510 Atherosclerotic heart disease of native coronary artery without angina pectoris: Secondary | ICD-10-CM

## 2016-12-28 DIAGNOSIS — I7103 Dissection of thoracoabdominal aorta: Secondary | ICD-10-CM | POA: Diagnosis not present

## 2016-12-28 DIAGNOSIS — N183 Chronic kidney disease, stage 3 (moderate): Secondary | ICD-10-CM

## 2016-12-28 DIAGNOSIS — I716 Thoracoabdominal aortic aneurysm, without rupture: Secondary | ICD-10-CM | POA: Diagnosis not present

## 2016-12-28 DIAGNOSIS — F419 Anxiety disorder, unspecified: Secondary | ICD-10-CM

## 2016-12-28 DIAGNOSIS — R1084 Generalized abdominal pain: Secondary | ICD-10-CM | POA: Insufficient documentation

## 2016-12-28 DIAGNOSIS — Z79899 Other long term (current) drug therapy: Secondary | ICD-10-CM

## 2016-12-28 DIAGNOSIS — F17210 Nicotine dependence, cigarettes, uncomplicated: Secondary | ICD-10-CM | POA: Insufficient documentation

## 2016-12-28 DIAGNOSIS — I130 Hypertensive heart and chronic kidney disease with heart failure and stage 1 through stage 4 chronic kidney disease, or unspecified chronic kidney disease: Secondary | ICD-10-CM

## 2016-12-28 DIAGNOSIS — Z8673 Personal history of transient ischemic attack (TIA), and cerebral infarction without residual deficits: Secondary | ICD-10-CM | POA: Insufficient documentation

## 2016-12-28 DIAGNOSIS — I712 Thoracic aortic aneurysm, without rupture: Secondary | ICD-10-CM | POA: Diagnosis not present

## 2016-12-28 DIAGNOSIS — I5042 Chronic combined systolic (congestive) and diastolic (congestive) heart failure: Secondary | ICD-10-CM

## 2016-12-28 DIAGNOSIS — Z7902 Long term (current) use of antithrombotics/antiplatelets: Secondary | ICD-10-CM | POA: Insufficient documentation

## 2016-12-28 DIAGNOSIS — I714 Abdominal aortic aneurysm, without rupture: Secondary | ICD-10-CM | POA: Diagnosis not present

## 2016-12-28 LAB — CBC
HEMATOCRIT: 41.8 % (ref 39.0–52.0)
HEMOGLOBIN: 13.9 g/dL (ref 13.0–17.0)
MCH: 31.2 pg (ref 26.0–34.0)
MCHC: 33.3 g/dL (ref 30.0–36.0)
MCV: 93.7 fL (ref 78.0–100.0)
PLATELETS: 150 10*3/uL (ref 150–400)
RBC: 4.46 MIL/uL (ref 4.22–5.81)
RDW: 14.6 % (ref 11.5–15.5)
WBC: 6.8 10*3/uL (ref 4.0–10.5)

## 2016-12-28 LAB — BASIC METABOLIC PANEL
ANION GAP: 8 (ref 5–15)
BUN: 11 mg/dL (ref 6–20)
CHLORIDE: 105 mmol/L (ref 101–111)
CO2: 23 mmol/L (ref 22–32)
CREATININE: 1.57 mg/dL — AB (ref 0.61–1.24)
Calcium: 9 mg/dL (ref 8.9–10.3)
GFR calc non Af Amer: 52 mL/min — ABNORMAL LOW (ref >60)
Glucose, Bld: 124 mg/dL — ABNORMAL HIGH (ref 65–99)
POTASSIUM: 3.3 mmol/L — AB (ref 3.5–5.1)
SODIUM: 136 mmol/L (ref 135–145)

## 2016-12-28 LAB — URINALYSIS, ROUTINE W REFLEX MICROSCOPIC
Bacteria, UA: NONE SEEN
Bilirubin Urine: NEGATIVE
Glucose, UA: NEGATIVE mg/dL
Hgb urine dipstick: NEGATIVE
Ketones, ur: NEGATIVE mg/dL
Leukocytes, UA: NEGATIVE
Nitrite: NEGATIVE
Protein, ur: 100 mg/dL — AB
SPECIFIC GRAVITY, URINE: 1.029 (ref 1.005–1.030)
SQUAMOUS EPITHELIAL / LPF: NONE SEEN
pH: 6 (ref 5.0–8.0)

## 2016-12-28 LAB — I-STAT TROPONIN, ED: Troponin i, poc: 0.02 ng/mL (ref 0.00–0.08)

## 2016-12-28 MED ORDER — HYDROMORPHONE HCL 1 MG/ML IJ SOLN
1.0000 mg | Freq: Once | INTRAMUSCULAR | Status: AC
  Administered 2016-12-28: 1 mg via INTRAVENOUS
  Filled 2016-12-28: qty 1

## 2016-12-28 MED ORDER — OXYCODONE-ACETAMINOPHEN 5-325 MG PO TABS
2.0000 | ORAL_TABLET | Freq: Once | ORAL | Status: AC
  Administered 2016-12-28: 2 via ORAL
  Filled 2016-12-28: qty 2

## 2016-12-28 MED ORDER — OXYCODONE-ACETAMINOPHEN 5-325 MG PO TABS
1.0000 | ORAL_TABLET | Freq: Once | ORAL | Status: AC
  Administered 2016-12-28: 1 via ORAL
  Filled 2016-12-28: qty 1

## 2016-12-28 MED ORDER — OXYCODONE-ACETAMINOPHEN 5-325 MG PO TABS: 1.0000 | ORAL_TABLET | Freq: Four times a day (QID) | ORAL | 0 refills | Status: DC | PRN

## 2016-12-28 NOTE — ED Notes (Signed)
Provider at bedside

## 2016-12-28 NOTE — ED Notes (Signed)
Pt taken to CT.

## 2016-12-28 NOTE — Discharge Instructions (Signed)
CALL DR[facility]IN[facility] NOW TO BE SEEN TODAY. BRING YOUR DISC OF IMAGES WITH YOU. REPORT DIRECTLY TO [facility] IF YOU HAVE ANY VOMITING, FEVER, OR WORSENING SYMPTOMS PRIOR TO YOUR CLINIC APPOINTMENT.

## 2016-12-28 NOTE — ED Provider Notes (Signed)
[facility] DEPT Provider Note   CSN: [id number] Arrival date & time: 12/28/16  1028     History   Chief Complaint Chief Complaint  Patient presents with  . Chest Pain  . Abdominal Pain    HPI [name] is a 46 y.o. male.  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 Patient presented last night with 2-3 days of severe, sharp, and diffuse abdominal pain radiating to his back. During last night's evaluation he had lab work and CTA which were reassuring and his surgeon over the phone recommended clinic follow up. He states he went home and when he woke up his pain had worsened even more and he came back to the ER. He and significant other state that they were told by office to come here for transfer. He did not fill his prescription for pain medications yet. No medications prior to arrival since he was last discharged.he's had nausea but no vomiting or diarrhea. No fevers.   The history is provided by the patient.  Chest Pain   Associated symptoms include abdominal pain.  Abdominal Pain      Past Medical History:  Diagnosis Date  . Adenomatous colon polyp 08/2015  . Anxiety   . Aortic disease ([place])   . Aortic dissection (HCC)    a. admx 04/2014 >> L renal infarct; a/c renal failure >> b.  s/p Bioprosthetic [place] and total arch replacement and staged endovascular repair of descending aortic aneurysm ([facility] - Dr. [name])  . CAD (coronary artery disease)    a. LHC 4/16:  oD1 60%  . Cardiomyopathy ([place])    a. non-ischemic - probably related to untreated HTN and ETOH abuse - Echo 3/13 with EF 35-40% >> b. Echo 4/16: Severe LVH, EF 55-60%, moderate AI, moderate MR, mild LAE, trivial effusion, known type B dissection with communication between true and false lumens with suprasternal images suggesting dissection plane may propagate to at least left  subclavian takeoff, root above aortic valve okay    . Chronic abdominal pain   . Chronic combined systolic and diastolic congestive heart failure ([place])    a. 05/2011: Adm with pulm edema/HTN urgency, EF 35-40% with diffuse hypokinesis and moderate to severe mitral regurgitation. Cardiomyopathy likely due to uncontrolled HTN and ETOH abuse - cath deferred due to renal insufficiency (felt due to uncontrolled HTN). bJodie Echevaria MV 06/2011: EF 37% and no ischemia or infarction. c. EF 45-50% by echo 01/2012.  [name] Chronic sinusitis   . CKD (chronic kidney disease)    a. 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  peak creatinine 3.46 during admx for aortic dissection 2/16.  sees Dr [name]  . Colon polyps 09/11/2015   Descending, sigmoid polyps  . DDD (degenerative disc disease), lumbar   . Depression   . Descending thoracic aortic aneurysm ([place])   . Dissecting aneurysm of thoracic aorta ([place])   . ETOH abuse    a. Reported to have quit 05/2011.  . Frequent headaches   . GERD (gastroesophageal reflux disease)   . Headache(784.0)    "q other day" (08/08/2013)  . Heart murmur   . Hemorrhoid thrombosis   . History of echocardiogram    Echo 1/17:  Severe LVH, EF 55-60%, no RWMA, Gr 2 DD, AVR ok, mild to mod MR, mild LAE, mild reduced RVSF, mod RAE  . History of medication noncompliance   .  HYPERLIPIDEMIA   . Hypertension    a. Hx of HTN urgency secondary to noncompliance. b. urinary metanephrine and catecholeamine levels normal 2013.  c. Renal art [name] 1/16:  No evidence of renal artery stenosis noted bilaterally.  . INGUINAL HERNIA   . Pneumonia ~ 2013  . Renal insufficiency   . Stroke ([place])   . Tobacco abuse   . Valvular heart disease    a. Echo 05/2011: moderate to severe eccentric MR and mild to moderate AI with prolapsing left coronary cusp. b. Echo 01/2012: mild-mod AI, mild dilitation of aortic root, mild MR.;  c. Echo 1/16: Severe LVH consistent with hypertrophic cardio myopathy, EF 50%, no RWMA, mod AI, mild  MR, mild RAE, dilated Ao root (40 mm);     Patient Active Problem List   Diagnosis Date Noted  . Cerebral infarction due to embolism of left middle cerebral artery ([place]) 06/03/2016  . History of aortic dissection 06/03/2016  . Palpitation   . Cerebrovascular accident (CVA) due to embolism of left middle cerebral artery ([place])   . Seizures ([place]) 02/28/2016  . HLD (hyperlipidemia) 08/28/2015  . 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[place]) 06/07/2015  . Cardiomyopathy ([place]) 03/01/2015  . Acid reflux 03/01/2015  . Essential hypertension 03/01/2015  . Cardiac chest pain 12/10/2014  . Abdominal pain 10/12/2014  . S/P aortic dissection repair 10/03/2014  . H/O aortic valve replacement 10/03/2014  . CKD (chronic kidney disease) 05/15/2014  . AKI (acute kidney injury) ([place])   . Aortic dissection, thoracoabdominal ([place])   . Dissecting aneurysm of thoracic aorta, Stanford type B ([place]) 04/24/2014  . Aortic dissection, thoracic ([place]) 04/24/2014  . Aortic dissection ([place]) 04/23/2014  . Aneurysm of ascending aorta (HCC) 03/28/2014  . Dyspnea 03/27/2014  . ETOH abuse 09/20/2013  . Malignant hypertension 02/20/2013  . Hypertensive urgency 01/27/2012  . Cardiomyopathy, hypertensive ([place]) 01/26/2012  . AI (aortic insufficiency) 01/26/2012  . MR (mitral regurgitation) 01/26/2012  . Acute renal failure superimposed on stage 3 chronic kidney disease ([place]) 01/26/2012  . Systolic CHF, chronic ([place]) 06/10/2011  . Chronic bronchitis 05/23/2011  . Cardiomegaly - hypertensive 05/22/2011  . Marijuana use 05/22/2011  . Abdominal pain 05/22/2011  . Hypertension, malignant 05/22/2011  . Anxiety and depression 05/22/2011  . Hyperlipidemia LDL goal <70 08/26/2009  . Tobacco use 08/26/2009  . Headache(784.0) 08/26/2009  . Aortic valve disorder 03/11/2009  . INGUINAL HERNIA 02/18/2009  . Uncontrolled hypertension 01/16/2009    Past Surgical History:  Procedure Laterality Date  . ANKLE SURGERY Bilateral      Fractures bilaterally  . AORTIC VALVE SURGERY  09/2014  . FOOT FRACTURE SURGERY Bilateral 2004-2010   "got pins in both of them"  . HEMORRHOID SURGERY N/A 06/15/2015   Procedure: HEMORRHOIDECTOMY;  Surgeon: Stark Klein, MD;  Location: [facility];  Service: General;  Laterality: N/A;  . INGUINAL HERNIA REPAIR Right ~ 1996  . LEFT HEART CATHETERIZATION WITH CORONARY ANGIOGRAM N/A 06/21/2014   Procedure: LEFT HEART CATHETERIZATION WITH CORONARY ANGIOGRAM;  Surgeon: Larey Dresser, MD;  Location: [facility] CATH LAB;  Service: Cardiovascular;  Laterality: N/A;  . LOOP RECORDER INSERTION N/A 06/19/2016   Procedure: Loop Recorder Insertion;  Surgeon: Deboraha Sprang, MD;  Location: [facility] CV LAB;  Service: Cardiovascular;  Laterality: N/A;  . TEE WITHOUT CARDIOVERSION N/A 03/12/2016   Procedure: TRANSESOPHAGEAL ECHOCARDIOGRAM (TEE);  Surgeon: Sanda Klein, MD;  Location: [facility] ENDOSCOPY;  Service: Cardiovascular;  Laterality: N/A;       Home Medications  Prior to Admission medications   Medication Sig Start Date End Date Taking? Authorizing Provider  acetaminophen (TYLENOL) 500 MG tablet Take 1,000 mg by mouth every 8 (eight) hours as needed for mild pain or moderate pain.    Yes [provider]  amLODipine (NORVASC) 10 MG tablet Take 10 mg by mouth at bedtime.   Yes [provider]  atorvastatin (LIPITOR) 20 MG tablet Take 1 tablet (20 mg total) by mouth daily. 03/18/16  Yes Hilty, Nadean Corwin, MD  DULoxetine (CYMBALTA) 30 MG capsule Take 30 mg by mouth daily.   Yes [provider]  famotidine (PEPCID) 20 MG tablet Take 1 tablet (20 mg total) by mouth 2 (two) times daily. 08/11/16  Yes Isla Pence, MD  hydrALAZINE (APRESOLINE) 50 MG tablet Take 1 tablet (50 mg total) by mouth 2 (two) times daily. 05/01/16  Yes Golden Circle, FNP  HYDROcodone-acetaminophen (NORCO/VICODIN) 5-325 MG tablet Take 1 tablet by mouth every 4 (four) hours as needed. Patient taking differently: Take  1 tablet by mouth every 4 (four) hours as needed for moderate pain.  08/11/16  Yes Isla Pence, MD  labetalol (NORMODYNE) 300 MG tablet Take 2 tablets (600 mg total) by mouth 2 (two) times daily. Patient taking differently: Take 300 mg by mouth 2 (two) times daily.  12/14/14  Yes Weaver, Scott T, PA-C  levETIRAcetam (KEPPRA) 500 MG tablet Take 1 tablet (500 mg total) by mouth 2 (two) times daily. 03/06/16  Yes Verlee Monte, MD  meclizine (ANTIVERT) 25 MG tablet Take 1 tablet 3 times a day by oral route as needed for dizzyness   Yes [provider]  pantoprazole (PROTONIX) 40 MG tablet Take 1 tablet (40 mg total) by mouth daily. Overdue Follow-up appt is due must see provider for refills 09/28/16  Yes Golden Circle, FNP  clopidogrel (PLAVIX) 75 MG tablet Take 1 tablet (75 mg total) by mouth daily. 03/07/16   Verlee Monte, MD  oxyCODONE-acetaminophen (PERCOCET/ROXICET) 5-325 MG tablet Take 1 tablet by mouth every 6 (six) hours as needed for severe pain. 12/28/16   Horton, Barbette Hair, MD    Family History Family History  Problem Relation Age of Onset  . Hypertension Mother   . Hypertension Unknown   . Colon cancer Paternal Uncle   . Stroke Maternal Aunt   . Heart attack Brother   . Diabetes Maternal Aunt   . Lung cancer Maternal Uncle   . Throat cancer Neg Hx   . Pancreatic cancer Neg Hx   . Esophageal cancer Neg Hx   . Kidney disease Neg Hx   . Liver disease Neg Hx     Social History Social History  Substance Use Topics  . Smoking status: Current Some Day Smoker    Packs/day: 0.25    Years: 23.00    Types: Cigarettes  . Smokeless tobacco: Never Used     Comment: 3 to 4 per day  . Alcohol use 0.6 oz/week    1 Cans of beer per week     Comment: occasionally     Allergies   Imdur [isosorbide nitrate] and Tramadol   Review of Systems Review of Systems  Cardiovascular: Positive for chest pain.  Gastrointestinal: Positive for abdominal pain.   All other  systems reviewed and are negative except that which was mentioned in HPI   Physical Exam Updated Vital Signs BP (!) 168/118   Pulse 84   Temp 97.8 F (36.6 C) (Oral)   Resp 20  SpO2 100%   Physical Exam  Constitutional: He is oriented to person, place, and time. He appears well-developed and well-nourished. No distress.  uncomfortable  HENT:  Head: Normocephalic and atraumatic.  Moist mucous membranes  Eyes:  B/l conjunctival injection  Neck: Neck supple.  Cardiovascular: Normal rate and regular rhythm.   Murmur heard. Pulmonary/Chest: Effort normal and breath sounds normal.  Abdominal: Soft. Bowel sounds are normal. He exhibits no distension and no mass. There is tenderness. There is no rebound and no guarding.  No pulsatile mass; tenderness to even very light palpation throughout entire abdomen  Musculoskeletal: He exhibits no edema.  Neurological: He is alert and oriented to person, place, and time.  Fluent speech  Skin: Skin is warm and dry.  Psychiatric:  Agitated, aggressive  Nursing note and vitals reviewed.    ED Treatments / Results  Labs (all labs ordered are listed, but only abnormal results are displayed) Labs Reviewed  BASIC METABOLIC PANEL - Abnormal; Notable for the following:       Result Value   Potassium 3.3 (*)    Glucose, Bld 124 (*)    Creatinine, Ser 1.57 (*)    GFR calc non Af Amer 52 (*)    All other components within normal limits  CBC  I-STAT TROPONIN, ED    EKG  EKG Interpretation  Date/Time:  Monday December 28 2016 10:28:02 EDT Ventricular Rate:  83 PR Interval:  150 QRS Duration: 92 QT Interval:  400 QTC Calculation: 470 R Axis:   34 Text Interpretation:  Normal sinus rhythm Biatrial enlargement Left ventricular hypertrophy with repolarization abnormality Cannot rule out Septal infarct , age undetermined Abnormal ECG No significant change since last tracing Confirmed by Theotis Burrow [phone]) on 12/28/2016 11:13:54 AM        Radiology Ct Angio Chest/abd/pel For Dissection W And/or Wo Contrast  Result Date: 12/28/2016 CLINICAL DATA:  Follow-up of known thoracoabdominal aortic aneurysm. EXAM: CT ANGIOGRAPHY CHEST, ABDOMEN AND PELVIS TECHNIQUE: Multidetector CT imaging through the chest, abdomen and pelvis was performed using the standard protocol during bolus administration of intravenous contrast. Multiplanar reconstructed images and MIPs were obtained and reviewed to evaluate the vascular anatomy. CONTRAST:  100 mL Isovue 370 COMPARISON:  08/11/2016 FINDINGS: CTA CHEST FINDINGS Cardiovascular: Noncontrast CT images of the chest demonstrate postoperative changes with median sternotomy. Apparent aortic valve replacement with ascending, arch, and descending aortic stent graft. No intramural hematoma. Scattered vascular calcifications. Images obtained during the arterial phase following contrast administration demonstrate patency of the thoracic aorta and great vessels. The lumen of the stent graft is patent. The false lumen/ aneurysm sac is thrombosed. No evidence of endo leak. Visualized central pulmonary arteries appear patent. No large central pulmonary embolus. Mild cardiac enlargement. No pericardial effusion. Mediastinum/Nodes: Esophagus is decompressed. No significant lymphadenopathy in the chest. Scattered lymph nodes are likely reactive and are unchanged since previous study. Lungs/Pleura: There is atelectasis in the lung bases. No focal consolidation or edema. No pleural effusions. No pneumothorax. Airways are patent. Musculoskeletal: Sternotomy wires. Normal alignment of the thoracic spine. No vertebral compression deformities. No destructive bone lesions. Review of the MIP images confirms the above findings. CTA ABDOMEN AND PELVIS FINDINGS VASCULAR Aorta: Tortuous and ectatic aorta. The stent graft terminates that the level of the low descending thoracic aorta just above the abdominal hiatus. Below this level, there  is persistent aneurysmal dissection of the upper thoracic aorta with aneurysmal dilatation at the level of the celiac axis measuring 4.5 x 6.1  cm. This measurement is increased since the previous study. Direct correlation is limited since the previous study was performed without contrast. The infrarenal abdominal aorta is tortuous with normal caliber. The dissection ends just below the level of the renal artery origins. Celiac: The celiac axis arises from the false limb and and is patent. SMA: The superior mesenteric artery arises from the true lumen and is patent. Renals: Single right renal artery arises from the true lumen and is patent. There is a stent or graft connecting the single left renal artery to the true lumen. The left renal artery is patent. IMA: The inferior mesenteric artery arises from the true lumen below the level of the dissection and is patent. Inflow: Left iliac artery aneurysm measuring 2 cm diameter. No change since previous study. Right iliac artery aneurysm measuring 1.6 cm diameter. No change since previous study. Calcification of the common iliac, internal and external iliac artery's. Calcific and non calcific stenosis of the origins of the internal iliac artery's bilaterally but the vessels remain patent. Veins: No obvious venous abnormality within the limitations of this arterial phase study. Review of the MIP images confirms the above findings. NON-VASCULAR Hepatobiliary: No focal liver abnormality is seen. No gallstones, gallbladder wall thickening, or biliary dilatation. Pancreas: Unremarkable. No pancreatic ductal dilatation or surrounding inflammatory changes. Spleen: Normal in size without focal abnormality. Adrenals/Urinary Tract: Adrenal glands are unremarkable. Kidneys are normal, without renal calculi, focal lesion, or hydronephrosis. Bladder is unremarkable. Stomach/Bowel: Stomach, small bowel, and colon are mostly decompressed. Scattered stool throughout the colon. No small  or large bowel distention or inflammatory changes identified. Appendix is not definitively visualized. Lymphatic: No significant lymphadenopathy. Reproductive: Prostate gland is enlarged, measuring 5.5 cm diameter. Other: No free air or free fluid in the abdomen. No retroperitoneal hematomas. Musculoskeletal: No acute or significant osseous findings. Review of the MIP images confirms the above findings. IMPRESSION: 1. Postoperative changes with aortic valve replacement and stent graft of the thoracic aorta. Graft appears patent. False lumen/aneurysm sac is thrombosed. No endoleak identified. 2. No evidence of active pulmonary disease. 3. Residual aneurysm and dissection of the upper abdominal aorta with dissection extending to the level just below the renal artery origins. Aortic diameter at the level of the celiac artery origin is 4.5 x 6.1 cm, increased since previous study. Persistent flow in the true and false lumens at this level. No evidence of aneurysm rupture or retroperitoneal hematoma. 4. The celiac axis arises from the false lumen and is patent. Major branch vessels of the abdominal aorta otherwise arise from the true lumen and are patent. 5. Atherosclerotic disease causes narrowing of the internal iliac artery's bilaterally. 6. Bilateral iliac artery aneurysms, measuring 1.6 cm on the right and 2 cm on the left. 7. No acute process is identified involving the internal abdominal organs. Prostate gland is enlarged. Electronically Signed   By: Lucienne Capers M.D.   On: 12/28/2016 00:49    Procedures Procedures (including critical care time)  Medications Ordered in ED Medications  oxyCODONE-acetaminophen (PERCOCET/ROXICET) 5-325 MG per tablet 2 tablet (2 tablets Oral Given 12/28/16 1157)     Initial Impression / Assessment and Plan / ED Course  I have reviewed the triage vital signs and the nursing notes.  Pertinent labs & imaging results that were available during my care of the patient  were reviewed by me and considered in my medical decision making (see chart for details).     PT w/ known aortic aneurysm scheduled for repair  at [facility] in 1 week comes back after he was evaluated earlier tonight for several days of abdominal pain. I reviewed notes from my colleague, Dr. Dina Rich, who did a thorough workup including CT angiogram, lab work, and personal discussion with his primary cardiothoracic surgeon, Dr. [name] at [facility]. At that time Dr. [name] felt that the pain was unlikely to be related to his aneurysm and he had recommended discharge with imaging on a disc and contacting the clinic today for follow-up appointment. The patient now tells me that he was supposed to be transferred. I contacted the transfer line at [facility] and spoke with nurse who was working with Dr. [name] in [place]. He confirmed that patient did not require transfer but needed to call the clinic to schedule appointment for today. He was already given imaging on disc at time of previous discharge. Currently his VS are stable, he is hypertensive but hasn't taken medications this morning and is frequently hypertensive. Labs obtained from triage show stable hemoglobin and pt reports that this is the same pain he had previously, only worse, thus I do not feel he needs any repeat imaging. I had a long discussion with the patient and his significant other regarding Dr. Wilson Singer recommendations. I provided with dose of percocet but explained that I would not be admitting for IV narcotics or transfer. Pt left prior to receiving discharge papers/return precautions.   Final Clinical Impressions(s) / ED Diagnoses   Final diagnoses:  Generalized abdominal pain    New Prescriptions Discharge Medication List as of 12/28/2016 12:06 PM       Little, Wenda Overland, MD 12/28/16 1452

## 2016-12-28 NOTE — ED Notes (Signed)
Pt states has not taken bp meds today.

## 2016-12-28 NOTE — ED Notes (Signed)
Requested disc for patient from CT

## 2016-12-28 NOTE — ED Triage Notes (Signed)
Pt reports diagnosed with aortic aneurysm, scheduled to have surgery next Monday, states he was here last night for cp and abd pain, was given pain meds but did not get them filled and pain is worse today.

## 2016-12-28 NOTE — Discharge Instructions (Signed)
You were seen today for abdominal pain. You need to follow-up closely with your cardiothoracic surgeon. Call clinic later today and take the provided images so that they may be reviewed. If you develop acute worsening of pain weakness, numbness, and the lower extremities or any new or worsening symptoms you need to be reevaluated.

## 2016-12-28 NOTE — ED Notes (Signed)
Pt not in room when this nurse went to discharge

## 2017-01-03 DIAGNOSIS — J811 Chronic pulmonary edema: Secondary | ICD-10-CM | POA: Diagnosis not present

## 2017-01-03 DIAGNOSIS — J9811 Atelectasis: Secondary | ICD-10-CM | POA: Diagnosis not present

## 2017-01-03 DIAGNOSIS — I517 Cardiomegaly: Secondary | ICD-10-CM | POA: Diagnosis not present

## 2017-01-03 DIAGNOSIS — D62 Acute posthemorrhagic anemia: Secondary | ICD-10-CM | POA: Diagnosis not present

## 2017-01-03 DIAGNOSIS — E785 Hyperlipidemia, unspecified: Secondary | ICD-10-CM | POA: Diagnosis present

## 2017-01-03 DIAGNOSIS — R1310 Dysphagia, unspecified: Secondary | ICD-10-CM | POA: Diagnosis present

## 2017-01-03 DIAGNOSIS — D72829 Elevated white blood cell count, unspecified: Secondary | ICD-10-CM | POA: Diagnosis not present

## 2017-01-03 DIAGNOSIS — K219 Gastro-esophageal reflux disease without esophagitis: Secondary | ICD-10-CM | POA: Diagnosis present

## 2017-01-03 DIAGNOSIS — R262 Difficulty in walking, not elsewhere classified: Secondary | ICD-10-CM | POA: Diagnosis not present

## 2017-01-03 DIAGNOSIS — R918 Other nonspecific abnormal finding of lung field: Secondary | ICD-10-CM | POA: Diagnosis not present

## 2017-01-03 DIAGNOSIS — I7100 Dissection of unspecified site of aorta: Secondary | ICD-10-CM | POA: Diagnosis not present

## 2017-01-03 DIAGNOSIS — I129 Hypertensive chronic kidney disease with stage 1 through stage 4 chronic kidney disease, or unspecified chronic kidney disease: Secondary | ICD-10-CM | POA: Diagnosis present

## 2017-01-03 DIAGNOSIS — I716 Thoracoabdominal aortic aneurysm, without rupture: Secondary | ICD-10-CM | POA: Diagnosis not present

## 2017-01-03 DIAGNOSIS — J90 Pleural effusion, not elsewhere classified: Secondary | ICD-10-CM | POA: Diagnosis not present

## 2017-01-03 DIAGNOSIS — Z7902 Long term (current) use of antithrombotics/antiplatelets: Secondary | ICD-10-CM | POA: Diagnosis not present

## 2017-01-03 DIAGNOSIS — N189 Chronic kidney disease, unspecified: Secondary | ICD-10-CM | POA: Diagnosis not present

## 2017-01-03 DIAGNOSIS — D696 Thrombocytopenia, unspecified: Secondary | ICD-10-CM | POA: Diagnosis not present

## 2017-01-03 DIAGNOSIS — F17210 Nicotine dependence, cigarettes, uncomplicated: Secondary | ICD-10-CM | POA: Diagnosis not present

## 2017-01-03 DIAGNOSIS — I69351 Hemiplegia and hemiparesis following cerebral infarction affecting right dominant side: Secondary | ICD-10-CM | POA: Diagnosis not present

## 2017-01-03 DIAGNOSIS — Z95828 Presence of other vascular implants and grafts: Secondary | ICD-10-CM | POA: Diagnosis not present

## 2017-01-03 DIAGNOSIS — Z4682 Encounter for fitting and adjustment of non-vascular catheter: Secondary | ICD-10-CM | POA: Diagnosis not present

## 2017-01-03 DIAGNOSIS — Z79891 Long term (current) use of opiate analgesic: Secondary | ICD-10-CM | POA: Diagnosis not present

## 2017-01-03 DIAGNOSIS — Z22322 Carrier or suspected carrier of Methicillin resistant Staphylococcus aureus: Secondary | ICD-10-CM | POA: Diagnosis not present

## 2017-01-03 DIAGNOSIS — G40909 Epilepsy, unspecified, not intractable, without status epilepticus: Secondary | ICD-10-CM | POA: Diagnosis present

## 2017-01-03 DIAGNOSIS — J810 Acute pulmonary edema: Secondary | ICD-10-CM | POA: Diagnosis not present

## 2017-01-03 DIAGNOSIS — I429 Cardiomyopathy, unspecified: Secondary | ICD-10-CM | POA: Diagnosis present

## 2017-01-03 DIAGNOSIS — F17200 Nicotine dependence, unspecified, uncomplicated: Secondary | ICD-10-CM | POA: Diagnosis present

## 2017-01-03 DIAGNOSIS — G8918 Other acute postprocedural pain: Secondary | ICD-10-CM | POA: Diagnosis not present

## 2017-01-03 DIAGNOSIS — Z452 Encounter for adjustment and management of vascular access device: Secondary | ICD-10-CM | POA: Diagnosis not present

## 2017-01-03 DIAGNOSIS — Z953 Presence of xenogenic heart valve: Secondary | ICD-10-CM | POA: Diagnosis not present

## 2017-01-03 DIAGNOSIS — I10 Essential (primary) hypertension: Secondary | ICD-10-CM | POA: Diagnosis not present

## 2017-01-03 DIAGNOSIS — I7103 Dissection of thoracoabdominal aorta: Secondary | ICD-10-CM | POA: Diagnosis not present

## 2017-01-13 ENCOUNTER — Telehealth: Payer: Self-pay | Admitting: *Deleted

## 2017-01-13 NOTE — Telephone Encounter (Signed)
LVMOM requesting manual transmission to review Full report and symptoms associated with pause episodes with no ECGs. [facility] phone number to call back.

## 2017-01-14 NOTE — Telephone Encounter (Signed)
Spoke with patient regarding sending manual transmission. Patient states transmission was successful. Patient also states his surgery was on 01/03/17. Advised patient once I receive the manual transmission will review all the information with Dr. Caryl Comes and will give him a call back if recommendations are given. Patient verbalized understanding.

## 2017-01-14 NOTE — Telephone Encounter (Signed)
Spoke with patient regarding sending manual transmission, patient requested a call back around 1300 due to being in a lot of pain. Advised I would give him a call back this afternoon.  Inquired about any syncope or presyncope related to 9 pause episodes. Patient denied any episodes. Patient states he had an aortic aneurysm repair done 2 weeks ago, but could not recall the exact date of surgery.

## 2017-01-15 ENCOUNTER — Ambulatory Visit (INDEPENDENT_AMBULATORY_CARE_PROVIDER_SITE_OTHER): Payer: Medicare Other | Admitting: *Deleted

## 2017-01-15 DIAGNOSIS — R1310 Dysphagia, unspecified: Secondary | ICD-10-CM | POA: Diagnosis not present

## 2017-01-15 DIAGNOSIS — I716 Thoracoabdominal aortic aneurysm, without rupture: Secondary | ICD-10-CM | POA: Diagnosis not present

## 2017-01-15 DIAGNOSIS — I63412 Cerebral infarction due to embolism of left middle cerebral artery: Secondary | ICD-10-CM

## 2017-01-15 DIAGNOSIS — I129 Hypertensive chronic kidney disease with stage 1 through stage 4 chronic kidney disease, or unspecified chronic kidney disease: Secondary | ICD-10-CM | POA: Diagnosis not present

## 2017-01-15 DIAGNOSIS — N189 Chronic kidney disease, unspecified: Secondary | ICD-10-CM | POA: Diagnosis not present

## 2017-01-15 DIAGNOSIS — I69351 Hemiplegia and hemiparesis following cerebral infarction affecting right dominant side: Secondary | ICD-10-CM | POA: Diagnosis not present

## 2017-01-15 DIAGNOSIS — Z48812 Encounter for surgical aftercare following surgery on the circulatory system: Secondary | ICD-10-CM | POA: Diagnosis not present

## 2017-01-18 NOTE — Progress Notes (Signed)
[facility] Summary Report / Loop Recorder 

## 2017-01-19 DIAGNOSIS — N189 Chronic kidney disease, unspecified: Secondary | ICD-10-CM | POA: Diagnosis not present

## 2017-01-19 DIAGNOSIS — I129 Hypertensive chronic kidney disease with stage 1 through stage 4 chronic kidney disease, or unspecified chronic kidney disease: Secondary | ICD-10-CM | POA: Diagnosis not present

## 2017-01-19 DIAGNOSIS — I716 Thoracoabdominal aortic aneurysm, without rupture: Secondary | ICD-10-CM | POA: Diagnosis not present

## 2017-01-19 DIAGNOSIS — I69351 Hemiplegia and hemiparesis following cerebral infarction affecting right dominant side: Secondary | ICD-10-CM | POA: Diagnosis not present

## 2017-01-19 DIAGNOSIS — R1310 Dysphagia, unspecified: Secondary | ICD-10-CM | POA: Diagnosis not present

## 2017-01-19 DIAGNOSIS — Z48812 Encounter for surgical aftercare following surgery on the circulatory system: Secondary | ICD-10-CM | POA: Diagnosis not present

## 2017-01-19 LAB — CUP PACEART REMOTE DEVICE CHECK
MDC IDC PG IMPLANT DT: 20180406
MDC IDC SESS DTM: 20181102223909

## 2017-01-20 IMAGING — US US ABDOMEN COMPLETE
1 series · 13 of 25 positions shown · non-contrast
Comparison: 03/21/2015

CLINICAL DATA: Abdominal pain, nausea, history of aortic dissection

EXAM:
ABDOMEN ULTRASOUND COMPLETE

[Series 1: us abdomen complete · 0.19mm/px · 13 of 88 slices shown]
[im 1/88]
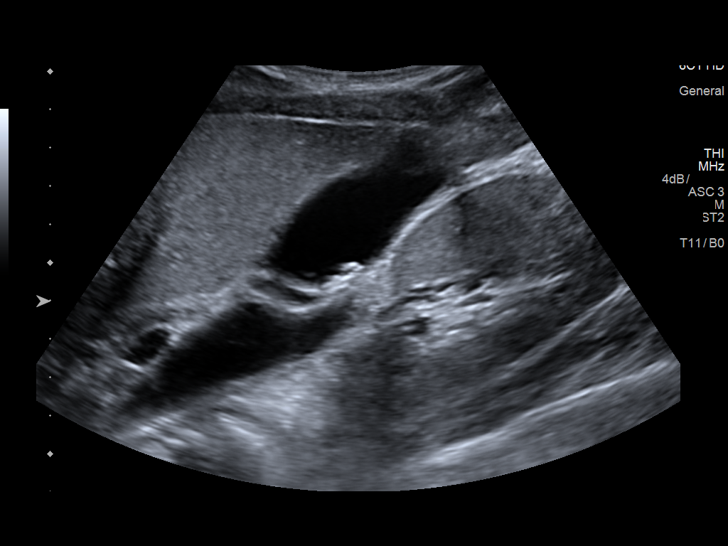
[im 8/88]
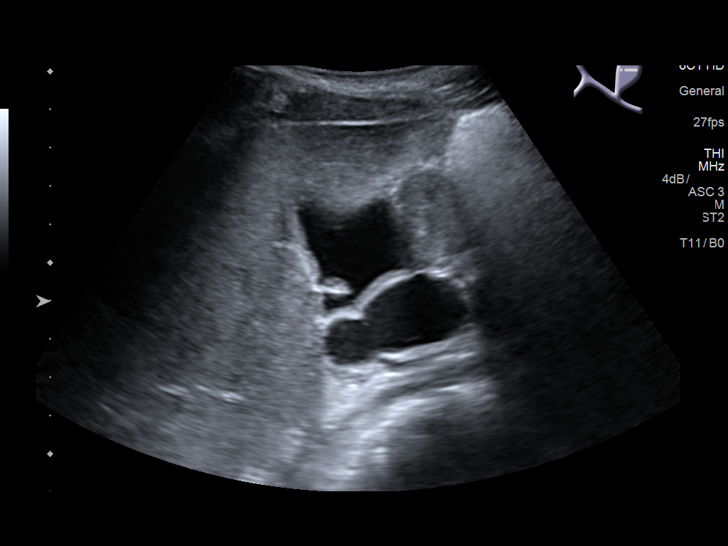
[im 15/88]
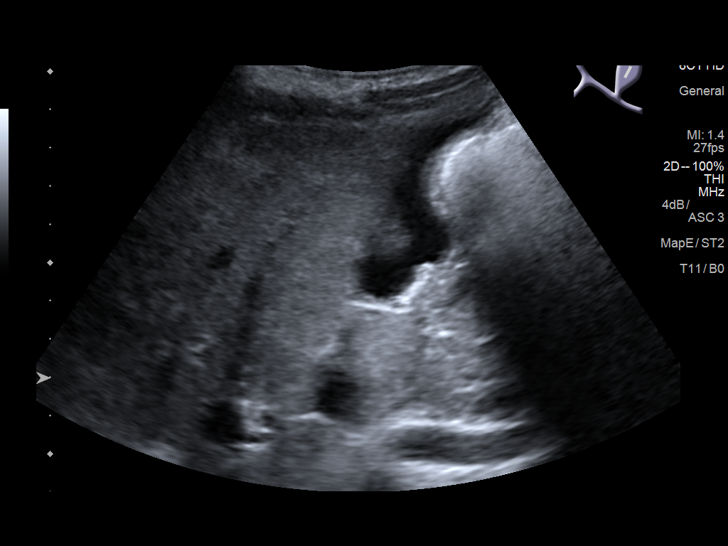
[im 22/88]
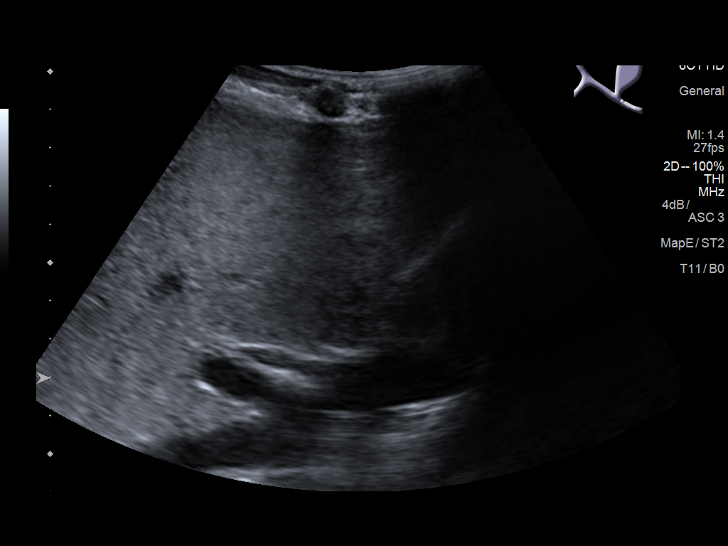
[im 30/88]
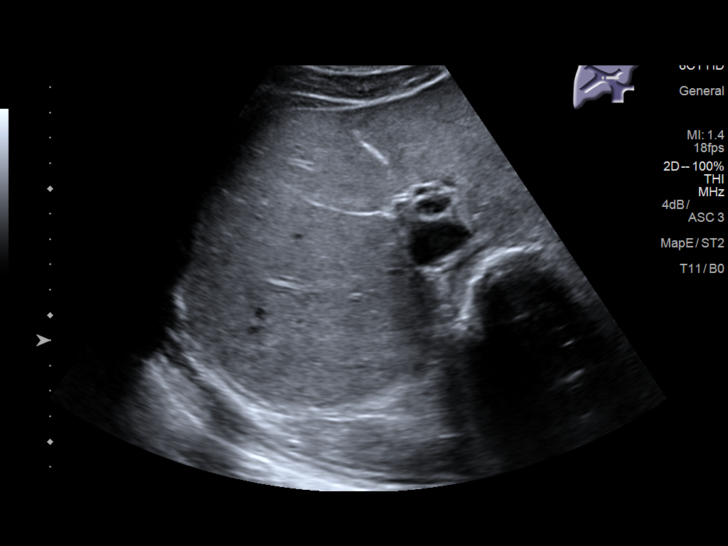
[im 37/88]
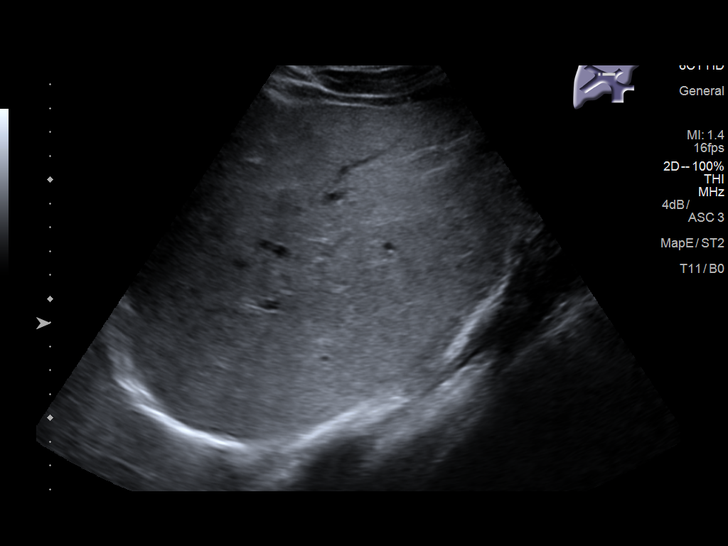
[im 44/88]
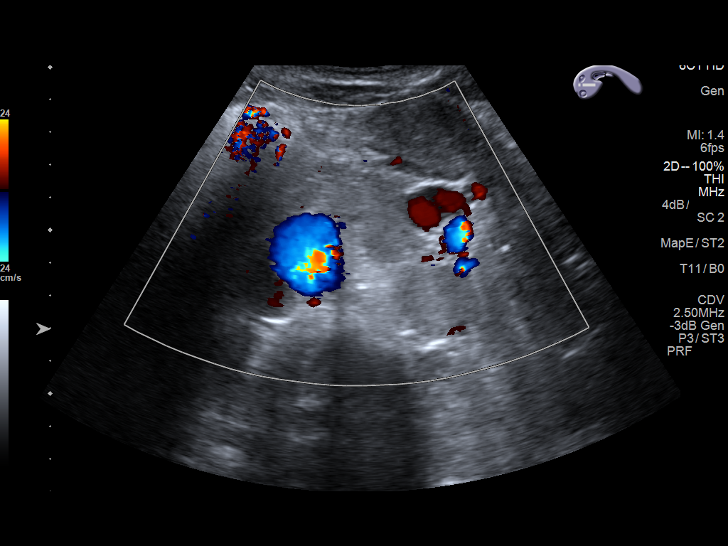
[im 51/88]
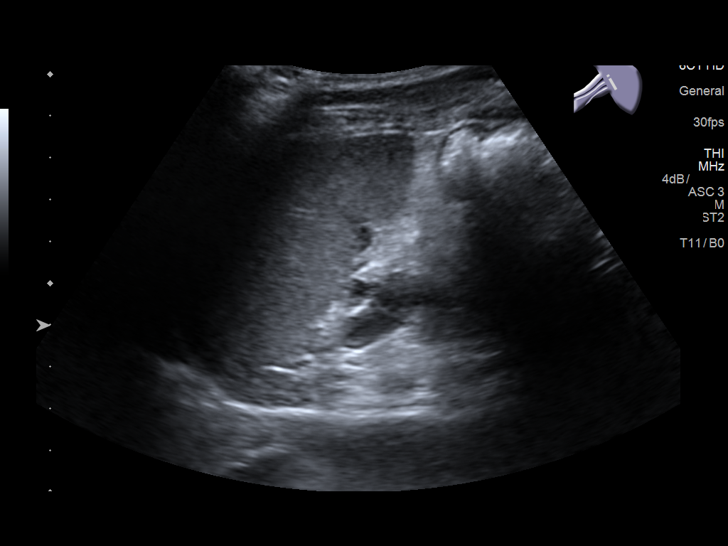
[im 59/88]
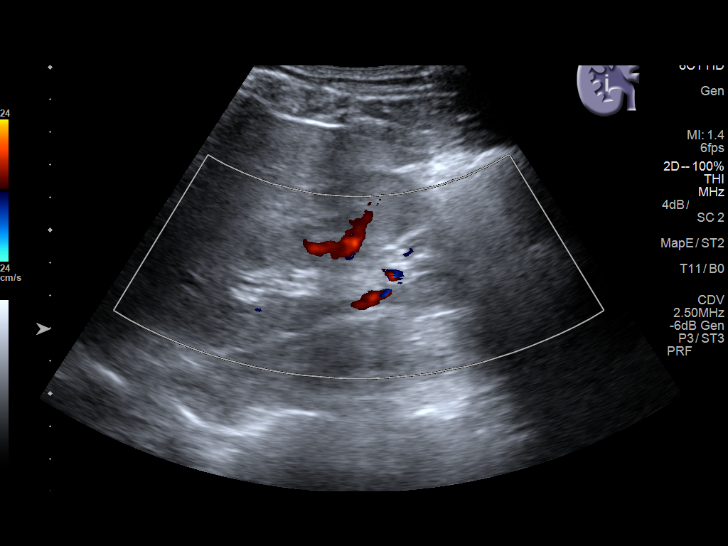
[im 66/88]
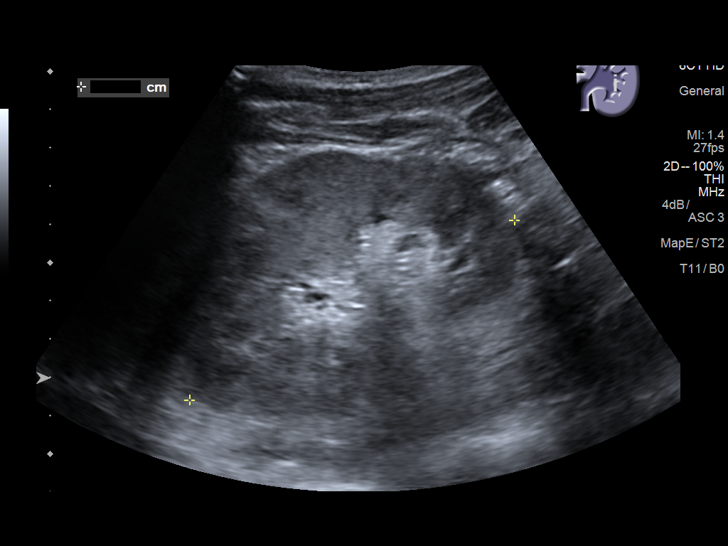
[im 73/88]
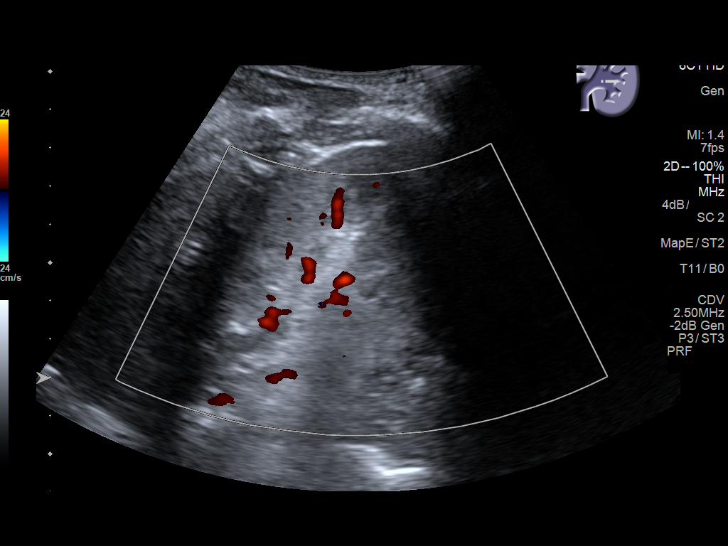
[im 80/88]
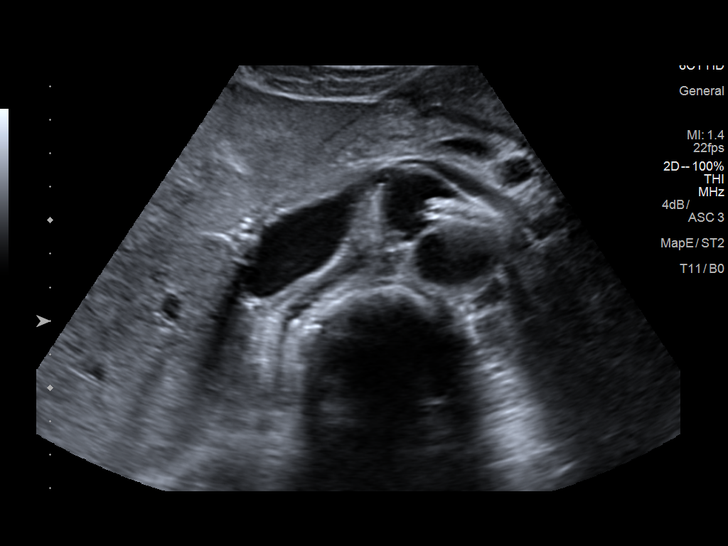
[im 88/88]
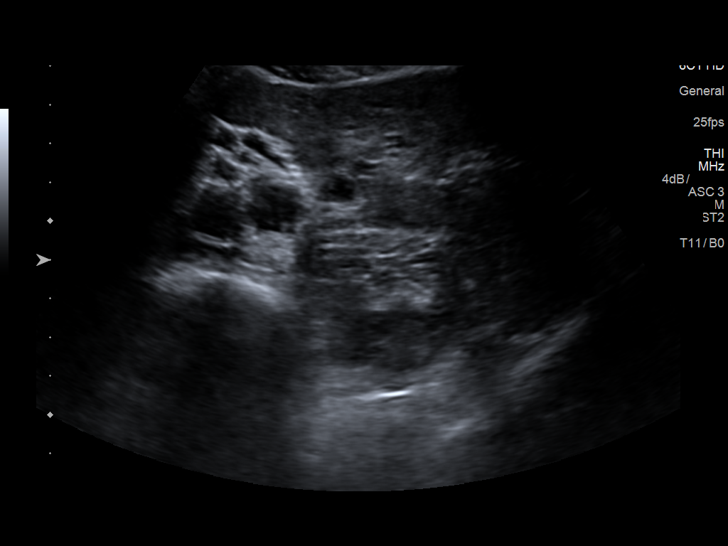

[13 of 25 positions shown; findings below may reference images not displayed]

FINDINGS: Gallbladder: Floating echogenic debris and probable noncalcified
gallstones are noted within gallbladder the largest measures 6 mm.
No sonographic Murphy's sign. No thickening of gallbladder wall.

Common bile duct: Diameter: 4.4 mm in diameter within normal limits.

Liver: No focal lesion identified. Within normal limits in
parenchymal echogenicity. Suboptimal evaluation of left hepatic lobe

IVC: No abnormality visualized.

Pancreas: Visualized portion unremarkable. Suboptimal evaluation of
the pancreatic at.

Spleen: Size and appearance within normal limits. Measures 6 cm in
length

Right Kidney: Length: 10 cm length. Echogenicity within normal
limits. No mass or hydronephrosis visualized.

Left Kidney: Length: 9.7 cm. Echogenicity within normal limits. No
mass or hydronephrosis visualized.

Abdominal aorta: Abdominal aortic stent partially visualized.
Abdominal aorta measures 4.4 cm in diameter.

Other findings: None.
IMPRESSION: 1. Floating debris and probable noncalcified gallstones are noted
within gallbladder the largest measures 6 mm. No sonographic
Murphy's sign. No thickening of gallbladder wall.
2. Normal CBD.
3. Abdominal aortic stent partially visualized. Abdominal aorta
measures 4.4 cm in diameter.
4. No hydronephrosis.

## 2017-01-21 DIAGNOSIS — I69351 Hemiplegia and hemiparesis following cerebral infarction affecting right dominant side: Secondary | ICD-10-CM | POA: Diagnosis not present

## 2017-01-21 DIAGNOSIS — N189 Chronic kidney disease, unspecified: Secondary | ICD-10-CM | POA: Diagnosis not present

## 2017-01-21 DIAGNOSIS — I129 Hypertensive chronic kidney disease with stage 1 through stage 4 chronic kidney disease, or unspecified chronic kidney disease: Secondary | ICD-10-CM | POA: Diagnosis not present

## 2017-01-21 DIAGNOSIS — Z48812 Encounter for surgical aftercare following surgery on the circulatory system: Secondary | ICD-10-CM | POA: Diagnosis not present

## 2017-01-21 DIAGNOSIS — R1310 Dysphagia, unspecified: Secondary | ICD-10-CM | POA: Diagnosis not present

## 2017-01-21 DIAGNOSIS — I716 Thoracoabdominal aortic aneurysm, without rupture: Secondary | ICD-10-CM | POA: Diagnosis not present

## 2017-01-25 DIAGNOSIS — K5909 Other constipation: Secondary | ICD-10-CM | POA: Diagnosis not present

## 2017-01-25 DIAGNOSIS — Z09 Encounter for follow-up examination after completed treatment for conditions other than malignant neoplasm: Secondary | ICD-10-CM | POA: Diagnosis not present

## 2017-01-25 DIAGNOSIS — R1111 Vomiting without nausea: Secondary | ICD-10-CM | POA: Diagnosis not present

## 2017-01-25 DIAGNOSIS — G8918 Other acute postprocedural pain: Secondary | ICD-10-CM | POA: Diagnosis not present

## 2017-01-25 DIAGNOSIS — Z8679 Personal history of other diseases of the circulatory system: Secondary | ICD-10-CM | POA: Diagnosis not present

## 2017-01-25 DIAGNOSIS — I716 Thoracoabdominal aortic aneurysm, without rupture: Secondary | ICD-10-CM | POA: Diagnosis not present

## 2017-01-25 DIAGNOSIS — I517 Cardiomegaly: Secondary | ICD-10-CM | POA: Diagnosis not present

## 2017-01-25 DIAGNOSIS — Z954 Presence of other heart-valve replacement: Secondary | ICD-10-CM | POA: Diagnosis not present

## 2017-01-25 DIAGNOSIS — Z79899 Other long term (current) drug therapy: Secondary | ICD-10-CM | POA: Diagnosis not present

## 2017-01-25 DIAGNOSIS — J9811 Atelectasis: Secondary | ICD-10-CM | POA: Diagnosis not present

## 2017-01-27 ENCOUNTER — Telehealth: Payer: Self-pay | Admitting: Cardiology

## 2017-01-27 DIAGNOSIS — R1310 Dysphagia, unspecified: Secondary | ICD-10-CM | POA: Diagnosis not present

## 2017-01-27 DIAGNOSIS — Z48812 Encounter for surgical aftercare following surgery on the circulatory system: Secondary | ICD-10-CM | POA: Diagnosis not present

## 2017-01-27 DIAGNOSIS — N189 Chronic kidney disease, unspecified: Secondary | ICD-10-CM | POA: Diagnosis not present

## 2017-01-27 DIAGNOSIS — I129 Hypertensive chronic kidney disease with stage 1 through stage 4 chronic kidney disease, or unspecified chronic kidney disease: Secondary | ICD-10-CM | POA: Diagnosis not present

## 2017-01-27 DIAGNOSIS — I69351 Hemiplegia and hemiparesis following cerebral infarction affecting right dominant side: Secondary | ICD-10-CM | POA: Diagnosis not present

## 2017-01-27 DIAGNOSIS — I716 Thoracoabdominal aortic aneurysm, without rupture: Secondary | ICD-10-CM | POA: Diagnosis not present

## 2017-01-27 NOTE — Telephone Encounter (Signed)
LMOVM requesting that pt send manual transmission b/c home monitor has not updated in at least 14 days.    

## 2017-01-31 ENCOUNTER — Other Ambulatory Visit: Payer: Self-pay

## 2017-01-31 ENCOUNTER — Emergency Department (HOSPITAL_COMMUNITY): Payer: Medicare Other

## 2017-01-31 ENCOUNTER — Emergency Department (HOSPITAL_COMMUNITY)
Admission: EM | Admit: 2017-01-31 | Discharge: 2017-01-31 | Disposition: A | Discharge status: Home / Self Care | Payer: Medicare Other | Attending: Emergency Medicine | Admitting: Emergency Medicine

## 2017-01-31 ENCOUNTER — Encounter (HOSPITAL_COMMUNITY): Payer: Self-pay | Admitting: *Deleted

## 2017-01-31 DIAGNOSIS — I130 Hypertensive heart and chronic kidney disease with heart failure and stage 1 through stage 4 chronic kidney disease, or unspecified chronic kidney disease: Secondary | ICD-10-CM | POA: Diagnosis not present

## 2017-01-31 DIAGNOSIS — R188 Other ascites: Secondary | ICD-10-CM | POA: Diagnosis not present

## 2017-01-31 DIAGNOSIS — Z79899 Other long term (current) drug therapy: Secondary | ICD-10-CM | POA: Diagnosis not present

## 2017-01-31 DIAGNOSIS — J90 Pleural effusion, not elsewhere classified: Secondary | ICD-10-CM | POA: Diagnosis not present

## 2017-01-31 DIAGNOSIS — R079 Chest pain, unspecified: Secondary | ICD-10-CM | POA: Diagnosis not present

## 2017-01-31 DIAGNOSIS — N183 Chronic kidney disease, stage 3 (moderate): Secondary | ICD-10-CM | POA: Insufficient documentation

## 2017-01-31 DIAGNOSIS — Z8679 Personal history of other diseases of the circulatory system: Secondary | ICD-10-CM | POA: Insufficient documentation

## 2017-01-31 DIAGNOSIS — I2510 Atherosclerotic heart disease of native coronary artery without angina pectoris: Secondary | ICD-10-CM | POA: Diagnosis not present

## 2017-01-31 DIAGNOSIS — G8918 Other acute postprocedural pain: Secondary | ICD-10-CM | POA: Insufficient documentation

## 2017-01-31 DIAGNOSIS — I5042 Chronic combined systolic (congestive) and diastolic (congestive) heart failure: Secondary | ICD-10-CM | POA: Insufficient documentation

## 2017-01-31 DIAGNOSIS — F17210 Nicotine dependence, cigarettes, uncomplicated: Secondary | ICD-10-CM | POA: Insufficient documentation

## 2017-01-31 DIAGNOSIS — R109 Unspecified abdominal pain: Secondary | ICD-10-CM | POA: Diagnosis not present

## 2017-01-31 LAB — HEPATIC FUNCTION PANEL
ALT: 17 U/L (ref 17–63)
AST: 21 U/L (ref 15–41)
Albumin: 3.2 g/dL — ABNORMAL LOW (ref 3.5–5.0)
Alkaline Phosphatase: 110 U/L (ref 38–126)
BILIRUBIN INDIRECT: 0.1 mg/dL — AB (ref 0.3–0.9)
Bilirubin, Direct: 0.2 mg/dL (ref 0.1–0.5)
TOTAL PROTEIN: 7.8 g/dL (ref 6.5–8.1)
Total Bilirubin: 0.3 mg/dL (ref 0.3–1.2)

## 2017-01-31 LAB — BASIC METABOLIC PANEL
ANION GAP: 9 (ref 5–15)
BUN: 11 mg/dL (ref 6–20)
CALCIUM: 9.6 mg/dL (ref 8.9–10.3)
CO2: 23 mmol/L (ref 22–32)
Chloride: 108 mmol/L (ref 101–111)
Creatinine, Ser: 1.61 mg/dL — ABNORMAL HIGH (ref 0.61–1.24)
GFR, EST AFRICAN AMERICAN: 59 mL/min — AB (ref >60)
GFR, EST NON AFRICAN AMERICAN: 51 mL/min — AB (ref >60)
Glucose, Bld: 98 mg/dL (ref 65–99)
Potassium: 4.1 mmol/L (ref 3.5–5.1)
Sodium: 140 mmol/L (ref 135–145)

## 2017-01-31 LAB — PROTIME-INR
INR: 1.09
PROTHROMBIN TIME: 14 s (ref 11.4–15.2)

## 2017-01-31 LAB — URINALYSIS, ROUTINE W REFLEX MICROSCOPIC
Bacteria, UA: NONE SEEN
Bilirubin Urine: NEGATIVE
GLUCOSE, UA: NEGATIVE mg/dL
HGB URINE DIPSTICK: NEGATIVE
Ketones, ur: NEGATIVE mg/dL
Leukocytes, UA: NEGATIVE
NITRITE: NEGATIVE
Protein, ur: 100 mg/dL — AB
pH: 5 (ref 5.0–8.0)

## 2017-01-31 LAB — RAPID URINE DRUG SCREEN, HOSP PERFORMED
AMPHETAMINES: NOT DETECTED
BARBITURATES: NOT DETECTED
Benzodiazepines: NOT DETECTED
Cocaine: NOT DETECTED
Opiates: NOT DETECTED
TETRAHYDROCANNABINOL: POSITIVE — AB

## 2017-01-31 LAB — CBC
HCT: 36.1 % — ABNORMAL LOW (ref 39.0–52.0)
HEMOGLOBIN: 11.9 g/dL — AB (ref 13.0–17.0)
MCH: 30.7 pg (ref 26.0–34.0)
MCHC: 33 g/dL (ref 30.0–36.0)
MCV: 93 fL (ref 78.0–100.0)
PLATELETS: 318 10*3/uL (ref 150–400)
RBC: 3.88 MIL/uL — AB (ref 4.22–5.81)
RDW: 13.8 % (ref 11.5–15.5)
WBC: 7.4 10*3/uL (ref 4.0–10.5)

## 2017-01-31 LAB — CBC WITH DIFFERENTIAL/PLATELET
Basophils Absolute: 0 10*3/uL (ref 0.0–0.1)
Basophils Relative: 1 %
EOS ABS: 0.3 10*3/uL (ref 0.0–0.7)
EOS PCT: 4 %
HCT: 32.9 % — ABNORMAL LOW (ref 39.0–52.0)
Hemoglobin: 11 g/dL — ABNORMAL LOW (ref 13.0–17.0)
LYMPHS ABS: 1.7 10*3/uL (ref 0.7–4.0)
Lymphocytes Relative: 24 %
MCH: 31.1 pg (ref 26.0–34.0)
MCHC: 33.4 g/dL (ref 30.0–36.0)
MCV: 92.9 fL (ref 78.0–100.0)
Monocytes Absolute: 0.6 10*3/uL (ref 0.1–1.0)
Monocytes Relative: 8 %
Neutro Abs: 4.7 10*3/uL (ref 1.7–7.7)
Neutrophils Relative %: 63 %
PLATELETS: 304 10*3/uL (ref 150–400)
RBC: 3.54 MIL/uL — AB (ref 4.22–5.81)
RDW: 14 % (ref 11.5–15.5)
WBC: 7.3 10*3/uL (ref 4.0–10.5)

## 2017-01-31 LAB — I-STAT TROPONIN, ED: Troponin i, poc: 0.01 ng/mL (ref 0.00–0.08)

## 2017-01-31 MED ORDER — IOPAMIDOL (ISOVUE-370) INJECTION 76%
INTRAVENOUS | Status: AC
  Administered 2017-01-31: 100 mL
  Filled 2017-01-31: qty 100

## 2017-01-31 MED ORDER — LABETALOL HCL 5 MG/ML IV SOLN
20.0000 mg | Freq: Once | INTRAVENOUS | Status: AC
  Administered 2017-01-31: 20 mg via INTRAVENOUS
  Filled 2017-01-31: qty 4

## 2017-01-31 MED ORDER — AMLODIPINE BESYLATE 5 MG PO TABS
10.0000 mg | ORAL_TABLET | Freq: Once | ORAL | Status: AC
  Administered 2017-01-31: 10 mg via ORAL
  Filled 2017-01-31: qty 2

## 2017-01-31 MED ORDER — OXYCODONE-ACETAMINOPHEN 5-325 MG PO TABS
2.0000 | ORAL_TABLET | Freq: Once | ORAL | Status: AC
  Administered 2017-01-31: 2 via ORAL
  Filled 2017-01-31: qty 2

## 2017-01-31 MED ORDER — LABETALOL HCL 200 MG PO TABS
600.0000 mg | ORAL_TABLET | Freq: Once | ORAL | Status: AC
  Administered 2017-01-31: 600 mg via ORAL
  Filled 2017-01-31: qty 3

## 2017-01-31 MED ORDER — HYDRALAZINE HCL 25 MG PO TABS
50.0000 mg | ORAL_TABLET | Freq: Once | ORAL | Status: AC
  Administered 2017-01-31: 50 mg via ORAL
  Filled 2017-01-31: qty 2

## 2017-01-31 NOTE — ED Notes (Signed)
Patient transported to X-ray 

## 2017-01-31 NOTE — ED Notes (Signed)
Pt reminded that a urine sample is needed; pt verbalized understanding

## 2017-01-31 NOTE — Discharge Instructions (Signed)
Follow up with your surgeon and primary care doctor.

## 2017-01-31 NOTE — ED Notes (Signed)
Pt states he understands instructions home stable with sife.

## 2017-01-31 NOTE — ED Provider Notes (Addendum)
[facility] EMERGENCY DEPARTMENT Provider Note   CSN: [id number] Arrival date & time: 01/31/17  2094     History   Chief Complaint Chief Complaint  Patient presents with  . Abdominal Pain  . Chest Pain    HPI [name] is a 46 y.o. male.  HPI  46 year old man history of aortic replacement 1021 presents today complaining of left-sided chest and left-sided abdominal pain since surgery.  He states that the pain has been ongoing but worsening.  He also is complaining of constipation with last bowel movement on Monday.  He states his abdomen is swollen and distended.  He has had nausea but has not vomited.  His p.o. intake has decreased and he is currently drinking only juice.  He states he was previously on Percocet for pain but took the last tablet last Monday.  The pain has been increasing in severity.  It is throbbing and sharp and worsens with movement.  He was seen in the cardiothoracic office and states that he did discuss the pain with them and they felt that it was due to his significant surgery.  He states that he did not take his blood pressure medications today but has been taking them on a regular basis previously.  Pain in the chest is worse with movement and is in the left side area.  He denies any increased dyspnea but states that the pain does take his breath away.  He has not noted any redness or swelling of the incision line.  He denies any headache, head injury, fever, diarrhea, or lateralized swelling.  He states he has had some chills.  Past Medical History:  Diagnosis Date  . Adenomatous colon polyp 08/2015  . Anxiety   . Aortic disease ([place])   . Aortic dissection (HCC)    a. admx 04/2014 >> L renal infarct; a/c renal failure >> b.  s/p Bioprosthetic [place] and total arch replacement and staged endovascular repair of descending aortic aneurysm ([facility] - Dr. [name])  . CAD (coronary artery disease)    a. LHC 4/16:  oD1 60%  . Cardiomyopathy ([place])     a. non-ischemic - probably related to untreated HTN and ETOH abuse - Echo 3/13 with EF 35-40% >> b. Echo 4/16: Severe LVH, EF 55-60%, moderate AI, moderate MR, mild LAE, trivial effusion, known type B dissection with communication between true and false lumens with suprasternal images suggesting dissection plane may propagate to at least left subclavian takeoff, root above aortic valve okay    . Chronic abdominal pain   . Chronic combined systolic and diastolic congestive heart failure ([place])    a. 05/2011: Adm with pulm edema/HTN urgency, EF 35-40% with diffuse hypokinesis and moderate to severe mitral regurgitation. Cardiomyopathy likely due to uncontrolled HTN and ETOH abuse - cath deferred due to renal insufficiency (felt due to uncontrolled HTN). bJodie Echevaria MV 06/2011: EF 37% and no ischemia or infarction. c. EF 45-50% by echo 01/2012.  [name] Chronic sinusitis   . CKD (chronic kidney disease)    a. 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  peak creatinine 3.46 during admx for aortic dissection 2/16.  sees Dr [name]  . Colon polyps 09/11/2015   Descending, sigmoid polyps  . DDD (degenerative disc disease), lumbar   . Depression   . Descending thoracic aortic aneurysm ([place])   . Dissecting aneurysm of thoracic aorta ([place])   . ETOH abuse    a. Reported to have quit 05/2011.  . Frequent headaches   .  GERD (gastroesophageal reflux disease)   . Headache(784.0)    "q other day" (08/08/2013)  . Heart murmur   . Hemorrhoid thrombosis   . History of echocardiogram    Echo 1/17:  Severe LVH, EF 55-60%, no RWMA, Gr 2 DD, AVR ok, mild to mod MR, mild LAE, mild reduced RVSF, mod RAE  . History of medication noncompliance   . HYPERLIPIDEMIA   . Hypertension    a. Hx of HTN urgency secondary to noncompliance. b. urinary metanephrine and catecholeamine levels normal 2013.  c. Renal art [name] 1/16:  No evidence of renal artery stenosis noted bilaterally.  . INGUINAL HERNIA   . Pneumonia ~ 2013  . Renal insufficiency     . Stroke ([place])   . Tobacco abuse   . Valvular heart disease    a. Echo 05/2011: moderate to severe eccentric MR and mild to moderate AI with prolapsing left coronary cusp. b. Echo 01/2012: mild-mod AI, mild dilitation of aortic root, mild MR.;  c. Echo 1/16: Severe LVH consistent with hypertrophic cardio myopathy, EF 50%, no RWMA, mod AI, mild MR, mild RAE, dilated Ao root (40 mm);     Patient Active Problem List   Diagnosis Date Noted  . Cerebral infarction due to embolism of left middle cerebral artery ([place]) 06/03/2016  . History of aortic dissection 06/03/2016  . Palpitation   . Cerebrovascular accident (CVA) due to embolism of left middle cerebral artery ([place])   . Seizures ([place]) 02/28/2016  . HLD (hyperlipidemia) 08/28/2015  . 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[place]) 06/07/2015  . Cardiomyopathy ([place]) 03/01/2015  . Acid reflux 03/01/2015  . Essential hypertension 03/01/2015  . Cardiac chest pain 12/10/2014  . Abdominal pain 10/12/2014  . S/P aortic dissection repair 10/03/2014  . H/O aortic valve replacement 10/03/2014  . CKD (chronic kidney disease) 05/15/2014  . AKI (acute kidney injury) ([place])   . Aortic dissection, thoracoabdominal ([place])   . 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 Aortic dissection, thoracic ([place]) 04/24/2014  . Aortic dissection ([place]) 04/23/2014  . Aneurysm of ascending aorta (HCC) 03/28/2014  . Dyspnea 03/27/2014  . ETOH abuse 09/20/2013  . Malignant hypertension 02/20/2013  . Hypertensive urgency 01/27/2012  . Cardiomyopathy, hypertensive ([place]) 01/26/2012  . AI (aortic insufficiency) 01/26/2012  . MR (mitral regurgitation) 01/26/2012  . Acute renal failure superimposed on stage 3 chronic kidney disease ([place]) 01/26/2012  . Systolic CHF, chronic ([place]) 06/10/2011  . Chronic bronchitis 05/23/2011  . Cardiomegaly - hypertensive 05/22/2011  . Marijuana use 05/22/2011  . Abdominal pain 05/22/2011  . Hypertension,  malignant 05/22/2011  . Anxiety and depression 05/22/2011  . Hyperlipidemia LDL goal <70 08/26/2009  . Tobacco use 08/26/2009  . Headache(784.0) 08/26/2009  . Aortic valve disorder 03/11/2009  . INGUINAL HERNIA 02/18/2009  . Uncontrolled hypertension 01/16/2009    Past Surgical History:  Procedure Laterality Date  . Aborted TEE  03/04/2016   Performed by Pixie Casino, MD at [facility]  . ANKLE SURGERY Bilateral    Fractures bilaterally  . AORTIC VALVE SURGERY  09/2014  . FOOT FRACTURE SURGERY Bilateral 2004-2010   "got pins in both of them"  . HEMORRHOIDECTOMY N/A 06/15/2015   Performed by Stark Klein, MD at [facility]  . INGUINAL HERNIA REPAIR Right ~ 1996  . LEFT HEART CATHETERIZATION WITH CORONARY ANGIOGRAM N/A 06/21/2014   Performed by Larey Dresser, MD at [facility] CATH LAB  . Loop Recorder Insertion N/A 06/19/2016   Performed  by Deboraha Sprang, MD at [facility] CV LAB  . TRANSESOPHAGEAL ECHOCARDIOGRAM (TEE) N/A 03/12/2016   Performed by Sanda Klein, MD at [facility]    Prior to Admission medications   Medication Sig Start Date End Date Taking? Authorizing Provider  acetaminophen (TYLENOL) 500 MG tablet Take 1,000 mg by mouth every 8 (eight) hours as needed for mild pain or moderate pain.     [provider]  amLODipine (NORVASC) 10 MG tablet Take 10 mg by mouth at bedtime.    [provider]  atorvastatin (LIPITOR) 20 MG tablet Take 1 tablet (20 mg total) by mouth daily. 03/18/16   Hilty, Nadean Corwin, MD  clopidogrel (PLAVIX) 75 MG tablet Take 1 tablet (75 mg total) by mouth daily. 03/07/16   Verlee Monte, MD  DULoxetine (CYMBALTA) 30 MG capsule Take 30 mg by mouth daily.    [provider]  famotidine (PEPCID) 20 MG tablet Take 1 tablet (20 mg total) by mouth 2 (two) times daily. 08/11/16   Isla Pence, MD  hydrALAZINE (APRESOLINE) 50 MG tablet Take 1 tablet (50 mg total) by mouth 2 (two) times daily. 05/01/16    Golden Circle, FNP  HYDROcodone-acetaminophen (NORCO/VICODIN) 5-325 MG tablet Take 1 tablet by mouth every 4 (four) hours as needed. Patient taking differently: Take 1 tablet by mouth every 4 (four) hours as needed for moderate pain.  08/11/16   Isla Pence, MD  labetalol (NORMODYNE) 300 MG tablet Take 2 tablets (600 mg total) by mouth 2 (two) times daily. Patient taking differently: Take 300 mg by mouth 2 (two) times daily.  12/14/14   Richardson Dopp T, PA-C  levETIRAcetam (KEPPRA) 500 MG tablet Take 1 tablet (500 mg total) by mouth 2 (two) times daily. 03/06/16   Verlee Monte, MD  meclizine (ANTIVERT) 25 MG tablet Take 1 tablet 3 times a day by oral route as needed for dizzyness    [provider]  oxyCODONE-acetaminophen (PERCOCET/ROXICET) 5-325 MG tablet Take 1 tablet by mouth every 6 (six) hours as needed for severe pain. 12/28/16   Horton, Barbette Hair, MD  pantoprazole (PROTONIX) 40 MG tablet Take 1 tablet (40 mg total) by mouth daily. Overdue Follow-up appt is due must see provider for refills 09/28/16   Golden Circle, FNP    Family History Family History  Problem Relation Age of Onset  . Hypertension Mother   . Hypertension Unknown   . Colon cancer Paternal Uncle   . Stroke Maternal Aunt   . Heart attack Brother   . Diabetes Maternal Aunt   . Lung cancer Maternal Uncle   . Throat cancer Neg Hx   . Pancreatic cancer Neg Hx   . Esophageal cancer Neg Hx   . Kidney disease Neg Hx   . Liver disease Neg Hx     Social History Social History   Tobacco Use  . Smoking status: Current Some Day Smoker    Packs/day: 0.25    Years: 23.00    Pack years: 5.75    Types: Cigarettes  . Smokeless tobacco: Never Used  . Tobacco comment: 3 to 4 per day  Substance Use Topics  . Alcohol use: Yes    Alcohol/week: 0.6 oz    Types: 1 Cans of beer per week    Comment: occasionally  . Drug use: Yes    Types: Marijuana    Comment: 3 times a month for pain  Allergies     Imdur [isosorbide nitrate] and Tramadol   Review of Systems Review of Systems  Constitutional: Positive for activity change, appetite change and chills. Negative for diaphoresis and fever.  HENT: Negative.  Negative for sneezing.   Eyes: Negative.   Respiratory: Negative.   Cardiovascular: Positive for chest pain.  Gastrointestinal: Positive for abdominal distention and abdominal pain.  Endocrine: Negative.   Genitourinary: Negative.   Musculoskeletal: Positive for back pain.  Skin: Negative.   Neurological: Negative.   Hematological: Negative.   Psychiatric/Behavioral: Negative.   All other systems reviewed and are negative.    Physical Exam Updated Vital Signs BP (!) 192/87   Pulse (!) 55   Temp 98 F (36.7 C) (Oral)   Resp 20   Ht 1.829 m (6')   Wt 63.5 kg (140 lb)   SpO2 99%   BMI 18.99 kg/m   Physical Exam  Constitutional: He appears well-developed and well-nourished.  HENT:  Head: Normocephalic and atraumatic.  Mouth/Throat: Oropharynx is clear and moist.  Eyes: EOM are normal. Pupils are equal, round, and reactive to light.  Cardiovascular: Normal rate and regular rhythm.  Pulmonary/Chest: Effort normal and breath sounds normal.  Abdominal: Normal appearance and bowel sounds are normal.  Healing incision- no signs of infection  Neurological: He is alert.  Skin: Skin is warm.  Psychiatric: He has a normal mood and affect.  Nursing note and vitals reviewed.    ED Treatments / Results  Labs (all labs ordered are listed, but only abnormal results are displayed) Labs Reviewed  BASIC METABOLIC PANEL - Abnormal; Notable for the following components:      Result Value   Creatinine, Ser 1.61 (*)    GFR calc non Af Amer 51 (*)    GFR calc Af Amer 59 (*)    All other components within normal limits  CBC - Abnormal; Notable for the following components:   RBC 3.88 (*)    Hemoglobin 11.9 (*)    HCT 36.1 (*)    All other components within normal limits   CBC WITH DIFFERENTIAL/PLATELET - Abnormal; Notable for the following components:   RBC 3.54 (*)    Hemoglobin 11.0 (*)    HCT 32.9 (*)    All other components within normal limits  HEPATIC FUNCTION PANEL - Abnormal; Notable for the following components:   Albumin 3.2 (*)    Indirect Bilirubin 0.1 (*)    All other components within normal limits  PROTIME-INR  URINALYSIS, ROUTINE W REFLEX MICROSCOPIC  RAPID URINE DRUG SCREEN, HOSP PERFORMED  I-STAT TROPONIN, ED    EKG  EKG Interpretation  Date/Time:  Sunday January 31 2017 07:47:00 EST Ventricular Rate:  85 PR Interval:  144 QRS Duration: 98 QT Interval:  384 QTC Calculation: 456 R Axis:   47 Text Interpretation:  Normal sinus rhythm Biatrial enlargement ST & T wave abnormality, consider lateral ischemia Abnormal ECG No significant change since last tracing Confirmed by Pattricia Boss [phone]) on 01/31/2017 8:47:48 AM       Radiology Dg Chest 2 View  Result Date: 01/31/2017 CLINICAL DATA:  PT states L chest and abdominal pain ever since his aortic aneurism repair in October. Hx of stroke,casd,pne,heart mummur EXAM: CHEST - 2 VIEW COMPARISON:  08/11/2016 FINDINGS: Some increase in subsegmental atelectasis versus infiltrate in basilar segments of the left lower lobe. Right lung remains clear. Previous median sternotomy and valve surgery. Stable appearance of thoracic stent graft. Implanted event monitor overlies the left hemithorax.  Small a left pleural effusion.  No pneumothorax. IMPRESSION: 1. New small left pleural effusion and adjacent left lower lung consolidation/atelectasis. 2. Stable postop changes. Electronically Signed   By: Lucrezia Europe M.D.   On: 01/31/2017 08:52   Ct Angio Chest/abd/pel For Dissection W And/or Wo Contrast  Result Date: 01/31/2017 CLINICAL DATA:  Chest pain.  Previous aortic stent graft deployment. EXAM: CT ANGIOGRAPHY CHEST, ABDOMEN AND PELVIS TECHNIQUE: Multidetector CT imaging through the chest,  abdomen and pelvis was performed using the standard protocol during bolus administration of intravenous contrast. Multiplanar reconstructed images and MIPs were obtained and reviewed to evaluate the vascular anatomy. CONTRAST:  172mL ISOVUE-370 IOPAMIDOL (ISOVUE-370) INJECTION 76% COMPARISON:  12/28/2016 and previous FINDINGS: CTA CHEST FINDINGS Cardiovascular: Left arm IV contrast administration. High-grade stenosis in the left innominate vein with thyrocervical collaterals providing left upper extremity drainage via the azygos venous system. The SVC is patent. Right ventricle is nondilated. Satisfactory opacification of pulmonary arteries noted, and there is no evidence of pulmonary emboli. Patent bilateral pulmonary veins drain into the left atrium. Left ventricular hypertrophy. Stable changes of AVR with ascending aortic and arch stent graft reconstruction, and patent surgical trunk grafts to native brachiocephalic vessels. Stent graft extends through the length of the descending thoracic aorta. No definite endoleak into distal descending thoracic aneurysm, approximately 4.8 cm diameter at the level of the coronary sinus, previously 5 cm by my measurement. Mediastinum/Nodes: No pericardial effusion. No definite hilar or mediastinal adenopathy. Lungs/Pleura: New small loculated left pleural effusion with posterior and medial components, as well as a small amount of fluid in the left major fissure. There is inch progressive consolidation/atelectasis/ scarring in the left lower lobe adjacent to the effusion. No pneumothorax. Right lung clear. Musculoskeletal: Previous median sternotomy and left thoracotomy. Negative for fracture or worrisome bone lesion. Review of the MIP images confirms the above findings. CTA ABDOMEN AND PELVIS FINDINGS VASCULAR Aorta: There has been interval placement of an aortic graft from the lower margin of the thoracic stent graft to the infrarenal segment. Patent surgical trunk grafts to  the celiac axis, SMA, and bilateral renal arteries. Tortuous ectatic native infrarenal aorta measured up to 2.5 cm diameter. No residual dissection or stenosis. There is fluid and a few gas bubbles around the proximal abdominal aortic graft within the walls of the native aorta. No evidence of pseudoaneurysm or retrograde filling of the excluded false lumen of previous aortic dissection. Celiac: Patent trunk graft to the celiac axis, with patent distal branch anatomy. SMA: Patent trunk graft to the proximal SMA, patent distally with classic distal branch anatomy. Renals: Patent trunk graft to the right renal artery, widely patent distally. Patent somewhat tortuous trunk graft to the left renal artery. Residual stent at the distal anastomosis appears patent, and distal native branches appearing patent. IMA: Patent without evidence of aneurysm, dissection, vasculitis or significant stenosis. Inflow: Ectatic native common iliac arteries, measured up to a 2 cm diameter left, 1.5 cm right. Ectatic tortuous atheromatous external iliac arteries, without stenosis or dissection. High-grade short-segment origin stenosis of the left internal iliac artery. Right internal iliac artery atheromatous but patent. Veins: Patent hepatic veins, portal vein, SMV, splenic vein, renal veins. The IVC is incompletely distended. Bilateral pelvic phleboliths. Review of the MIP images confirms the above findings. NON-VASCULAR Hepatobiliary: Stable subcentimeter hepatic segment 6 probable cyst. No new liver abnormality is seen. No gallstones, gallbladder wall thickening, or biliary dilatation. Pancreas: Unremarkable. No pancreatic ductal dilatation or surrounding inflammatory changes. Spleen: Normal in  size without focal abnormality. There is a crescentic 5.5 x3.8 x 1 cm perisplenic fluid collection posteriorly. Adrenals/Urinary Tract: Adrenals unremarkable. Focal areas of parenchymal loss in left kidney, stable. No hydronephrosis. Urinary  bladder incompletely distended. Stomach/Bowel: Stomach is nondistended. Small bowel and colon are nondilated. The appendix is not discretely identified. Lymphatic: No definite abdominal or pelvic adenopathy. Reproductive: Prostatic enlargement with central coarse calcification. Other: No definite ascites.  No free air. Musculoskeletal: Healing presumed incision in the left anterior body wall. Negative for fracture or worrisome bone lesion. Review of the MIP images confirms the above findings. IMPRESSION: 1. Stable changes of AVR, aortic stent graft. Patent trunk grafts to brachiocephalic arteries. 2. Interval aortic graft repair of dissection/aneurysm, with patent trunk grafts to celiac axis, SMA, and bilateral renal arteries. 3. Crescentic 5.5 cm posterior perisplenic fluid collection, possibly resolving hematoma but nonspecific. 4. 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 Electronically Signed   By: Lucrezia Europe M.D.   On: 01/31/2017 11:42    Procedures Procedures (including critical care time)  Medications Ordered in ED Medications  labetalol (NORMODYNE,TRANDATE) injection 20 mg (not administered)  amLODipine (NORVASC) tablet 10 mg (not administered)  hydrALAZINE (APRESOLINE) tablet 50 mg (not administered)  labetalol (NORMODYNE) tablet 600 mg (not administered)     Initial Impression / Assessment and Plan / ED Course  I have reviewed the triage vital signs and the nursing notes.  Pertinent labs & imaging results that were available during my care of the patient were reviewed by me and considered in my medical decision making (see chart for details).    46 year old man status post repair of thoracic abdominal aortic aneurysm at [facility] on 1021 presents today with worsening left-sided pain.  CT here shows a 5.5 cm perisplenic fluid collection likely hematoma.  Only anemic with hemoglobin of 11.11 otherwise he is hypertensive but he dynamically stable.   He did not take his a.m. medications.  Plan consultation with cardiothoracic surgeon due to arrange for follow-up.  Discussed with Dr. [name] who advises the patient should be referred to pain management.  Patient given 2 Percocet here and advised to follow-up with surgeon and primary care. Final Clinical Impressions(s) / ED Diagnoses   Final diagnoses:  Post-op pain  Abdominal fluid collection    ED Discharge Orders    None       Pattricia Boss, MD 01/31/17 1258 Physical exam was omitted on original documentation and added at later date   Pattricia Boss, MD 02/17/17 1325

## 2017-01-31 NOTE — ED Triage Notes (Signed)
PT states L chest and abdominal pain ever since his aortic aneurism repair in October.  Pt came in today b/c he is unable to sleep d/t the pain and he is constipated.  Last BM last Monday.

## 2017-02-01 DIAGNOSIS — R1310 Dysphagia, unspecified: Secondary | ICD-10-CM | POA: Diagnosis not present

## 2017-02-01 DIAGNOSIS — I716 Thoracoabdominal aortic aneurysm, without rupture: Secondary | ICD-10-CM | POA: Diagnosis not present

## 2017-02-01 DIAGNOSIS — N189 Chronic kidney disease, unspecified: Secondary | ICD-10-CM | POA: Diagnosis not present

## 2017-02-01 DIAGNOSIS — I69351 Hemiplegia and hemiparesis following cerebral infarction affecting right dominant side: Secondary | ICD-10-CM | POA: Diagnosis not present

## 2017-02-01 DIAGNOSIS — I129 Hypertensive chronic kidney disease with stage 1 through stage 4 chronic kidney disease, or unspecified chronic kidney disease: Secondary | ICD-10-CM | POA: Diagnosis not present

## 2017-02-01 DIAGNOSIS — Z48812 Encounter for surgical aftercare following surgery on the circulatory system: Secondary | ICD-10-CM | POA: Diagnosis not present

## 2017-02-03 DIAGNOSIS — R1310 Dysphagia, unspecified: Secondary | ICD-10-CM | POA: Diagnosis not present

## 2017-02-03 DIAGNOSIS — I69351 Hemiplegia and hemiparesis following cerebral infarction affecting right dominant side: Secondary | ICD-10-CM | POA: Diagnosis not present

## 2017-02-03 DIAGNOSIS — Z48812 Encounter for surgical aftercare following surgery on the circulatory system: Secondary | ICD-10-CM | POA: Diagnosis not present

## 2017-02-03 DIAGNOSIS — N189 Chronic kidney disease, unspecified: Secondary | ICD-10-CM | POA: Diagnosis not present

## 2017-02-03 DIAGNOSIS — I716 Thoracoabdominal aortic aneurysm, without rupture: Secondary | ICD-10-CM | POA: Diagnosis not present

## 2017-02-03 DIAGNOSIS — I129 Hypertensive chronic kidney disease with stage 1 through stage 4 chronic kidney disease, or unspecified chronic kidney disease: Secondary | ICD-10-CM | POA: Diagnosis not present

## 2017-02-10 DIAGNOSIS — I716 Thoracoabdominal aortic aneurysm, without rupture: Secondary | ICD-10-CM | POA: Diagnosis not present

## 2017-02-10 DIAGNOSIS — I69351 Hemiplegia and hemiparesis following cerebral infarction affecting right dominant side: Secondary | ICD-10-CM | POA: Diagnosis not present

## 2017-02-10 DIAGNOSIS — Z48812 Encounter for surgical aftercare following surgery on the circulatory system: Secondary | ICD-10-CM | POA: Diagnosis not present

## 2017-02-10 DIAGNOSIS — I129 Hypertensive chronic kidney disease with stage 1 through stage 4 chronic kidney disease, or unspecified chronic kidney disease: Secondary | ICD-10-CM | POA: Diagnosis not present

## 2017-02-10 DIAGNOSIS — R1310 Dysphagia, unspecified: Secondary | ICD-10-CM | POA: Diagnosis not present

## 2017-02-10 DIAGNOSIS — N189 Chronic kidney disease, unspecified: Secondary | ICD-10-CM | POA: Diagnosis not present

## 2017-02-10 NOTE — Telephone Encounter (Signed)
Dr. Caryl Comes reviewed ECGs on 01/20/17 and agreed that they indicate A-fib, but as episodes were short in duration and occurred post-operatively (TAAA repair at [facility]), plan to continue to monitor via [facility] for recurrent episodes/longer duration.  No changes at this time per Dr. Caryl Comes.

## 2017-02-15 ENCOUNTER — Ambulatory Visit (INDEPENDENT_AMBULATORY_CARE_PROVIDER_SITE_OTHER): Payer: Medicare Other | Admitting: *Deleted

## 2017-02-15 DIAGNOSIS — I63412 Cerebral infarction due to embolism of left middle cerebral artery: Secondary | ICD-10-CM | POA: Diagnosis not present

## 2017-02-15 NOTE — Progress Notes (Signed)
[facility] Summary Report / Loop Recorder 

## 2017-02-23 LAB — CUP PACEART REMOTE DEVICE CHECK
Implantable Pulse Generator Implant Date: 20180406
MDC IDC SESS DTM: 20181202230922

## 2017-02-24 IMAGING — CT CT CTA ABD/PEL W/CM AND/OR W/O CM
1 of 8 series · 10 of 36 positions shown · IV contrast (75CC ISOVUE 370)
Comparison: Chest CT- 04/23/2014; 03/27/2014 ; 09/19/2012

CLINICAL DATA: History of thoracic aortic and abdominal aortic
aneurysm as well as height B thoracic aortic dissection. History of
partial left nephrectomy due to renal infarct. History of smoking.

EXAM:
CT ANGIOGRAPHY CHEST, ABDOMEN AND PELVIS
TECHNIQUE: Multidetector CT imaging through the chest, abdomen and pelvis was
performed using the standard protocol during bolus administration of
intravenous contrast. Multiplanar reconstructed images and MIPs were
obtained and reviewed to evaluate the vascular anatomy.
CONTRAST:  75 cc Isovue 370

[Series 4: angio (id) · axial · 0.70mm/px · z∈[-566,-61]mm · 10 of 248 slices shown]
[im 23/248  lung]
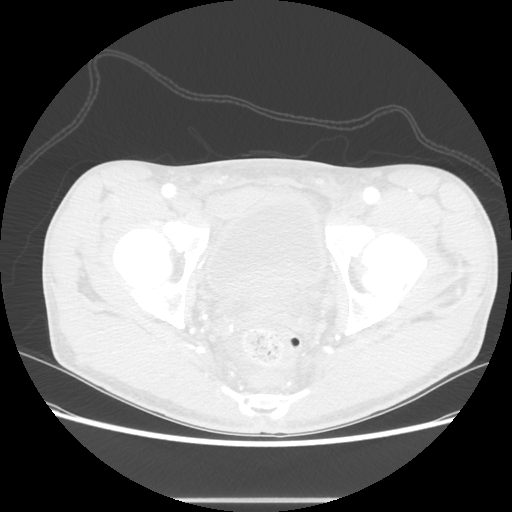
[im 45/248  mediastinal]
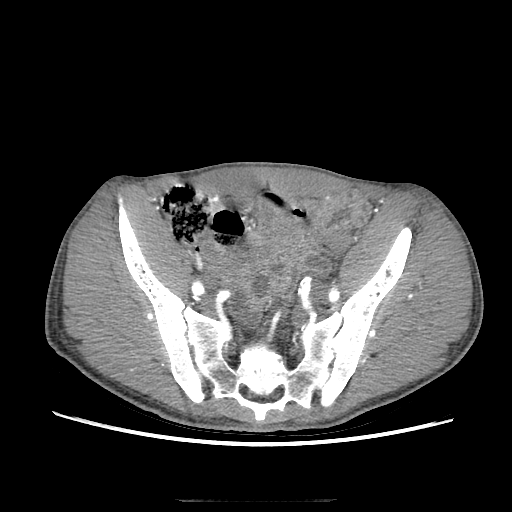
[im 68/248  lung]
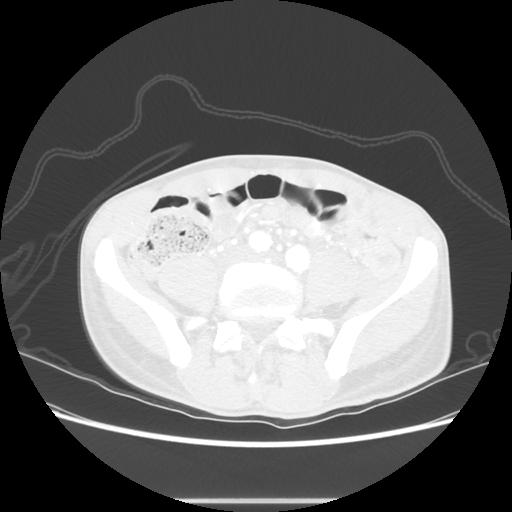
[im 90/248  mediastinal]
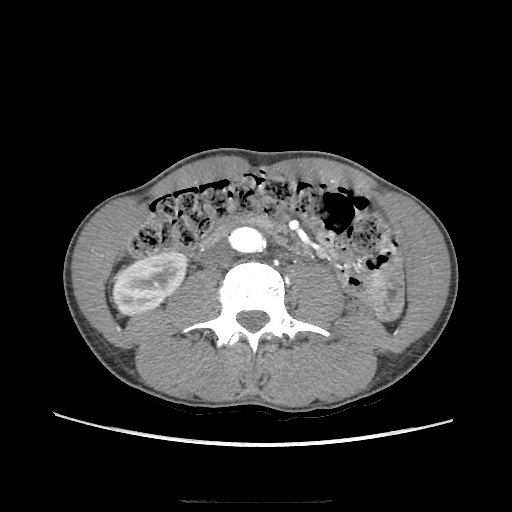
[im 113/248  lung]
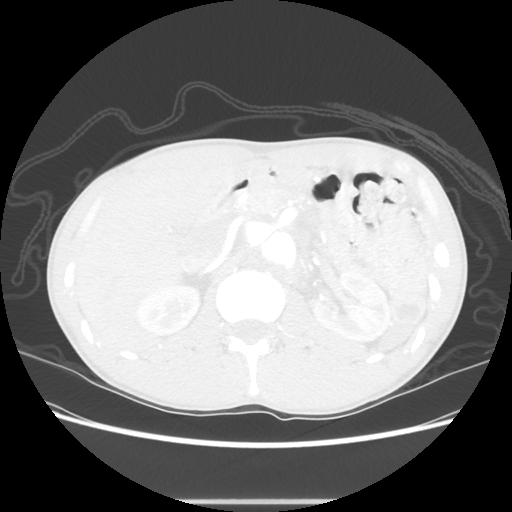
[im 135/248  mediastinal]
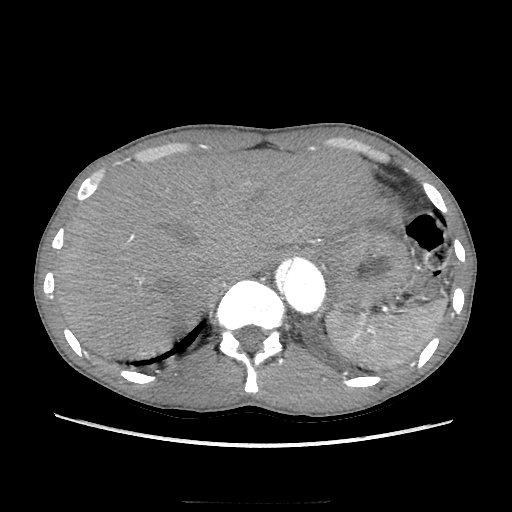
[im 158/248  lung]
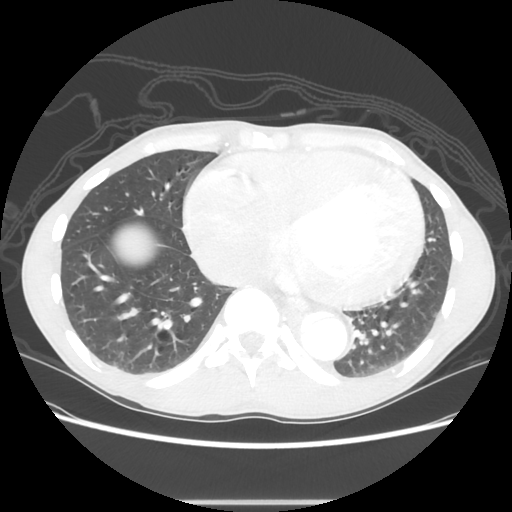
[im 180/248  mediastinal]
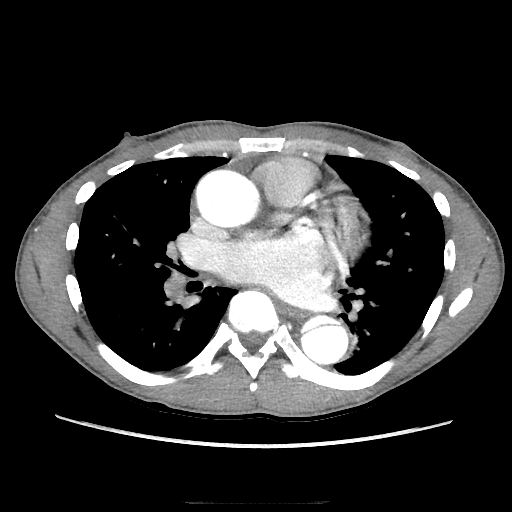
[im 203/248  lung]
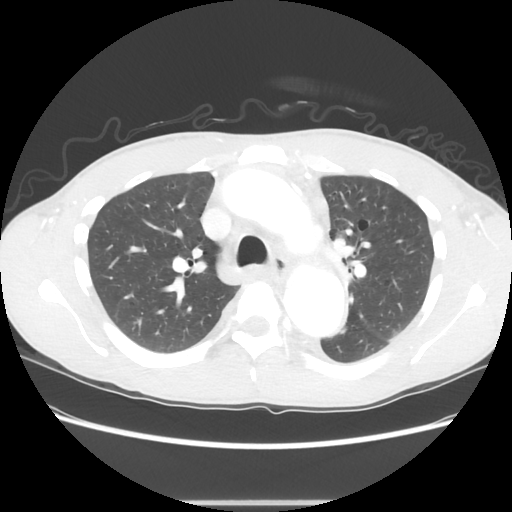
[im 225/248  mediastinal]
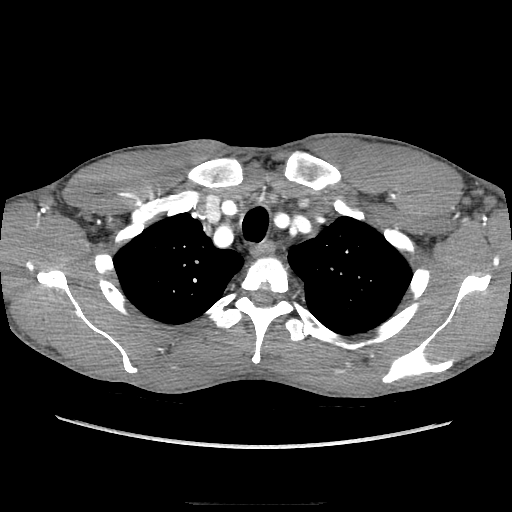

[10 of 36 positions shown; findings below may reference images not displayed]

FINDINGS: Vascular Findings of the chest:

Similar findings of a type B thoracic aortic dissection with origin
regional to the takeoff of the left subclavian artery
(representative axial image 45, series 4, coronal images 73 and 84,
series 602). No definite dissection of the ascending thoracic aorta
given pulsation artifact.

Both the true and false lumens of the dissection remain patent to
the level of the abdominal aorta, though there has been interval
enlargement of the false lumen in relation to the true lumen. No
definite periaortic stranding.

There is unchanged aneurysmal dilatation of the ascending thoracic
aorta though there has been interval enlargement of aneurysmal
dilatation of the aortic arch, currently measuring 51 mm in maximal
diameter, previously, 42 mm.

Conventional configuration of the aortic arch. There is no extension
of the thoracic aortic dissection to involve any of the branch
vessels of the aortic arch. The branch vessels of the aortic arch
remain widely patent throughout their imaged course. Non-opacified
blood is seen within the central aspect of the left internal jugular
vein, less likely a nonocclusive DVT.

Cardiomegaly.  No pericardial effusion.

Although this examination was not tailored for the evaluation of the
pulmonary arteries, there are no discrete filling defects within the
central pulmonary arterial tree to suggest central pulmonary
embolism. Borderline enlarged caliber the main pulmonary artery
measuring approximately 36 mm in diameter (image 61, series 4).

-------------------------------------------------------------

Thoracic aortic measurements:

Aortic root:

49 mm in greatest oblique coronal dimension diameter (image 63,
series 601), grossly unchanged.

Sinotubular junction

43 mm as measured in greatest oblique coronal dimension (image 61,
series 601), grossly unchanged.

Proximal ascending aorta

44 mm as measured in greatest oblique axial dimension at the level
of the main pulmonary artery (image 61, series 4) an approximately
44 mm in greatest oblique coronal dimension (coronal image 57,
series 601), grossly unchanged.

Aortic arch aorta

48 mm as measured in greatest oblique sagittal dimension (sagittal
image 89, series 602), previously, 41 mm, and approximately 51 mm in
greatest oblique axial diameter (image 38, series 4), previously, 42
mm

Proximal descending thoracic aorta

36 mm as measured in greatest oblique axial dimension at the level
of the main pulmonary artery (image 61, series 4), minimally
increased in size in the interval, previously, 34 mm

Distal descending thoracic aorta

33 mm as measured in greatest oblique axial dimension at the level
of the diaphragmatic hiatus (image 122, series 4) an approximately
34 mm in greatest oblique sagittal diameter (image 90, series 602,
previously, 32 mm.

Review of the MIP images confirms the above findings.

-------------------------------------------------------------

Non-Vascular Findings of the chest:

There is minimal dependent subpleural ground-glass atelectasis. No
discrete focal airspace opacities. No pleural effusion or
pneumothorax. The central pulmonary airways appear widely patent.

No discrete pulmonary nodules.

Ill-defined soft tissue within the anterior mediastinum appears
slightly more confluent on the present examination with dominant
component now measuring approximately 5.8 x 1.3 cm in maximal
diameter (image 51, series 4), previously, dominant conglomeration
measured approximately 4.1 x 0.8 cm, though note, exact measurements
are difficult secondary to ill-defined borders of this soft tissue.
Scattered shotty mediastinal lymph nodes individually not enlarged
by size criteria with index pretracheal lymph node measuring 0.8 cm
in greatest short axis diameter (image 45, series 4). No
mediastinal, hilar or axillary lymphadenopathy.

No acute or aggressive osseous abnormalities within the chest.

Regional soft tissues appear normal. Normal appearance of the
thyroid gland.

---------------------------------------------------------------

Vascular Findings of the abdomen and pelvis:

Abdominal aorta: There is persistent extension of the type B
thoracic aortic dissection through the proximal aspect of the
abdominal aorta. Previously, the dissection was noted to terminate
at the mid aspect of the infrarenal abdominal aorta however
presently the dissection extends at the origin of the left renal
artery with there has been development of an eccentric saccular
outpouching about the left renal artery ostia (representative
coronal image 69, series 601)

There has been interval enlargement of the supra and perirenal
abdominal aorta with the perirenal abdominal aorta now measuring
approximately 31 mm in greatest oblique coronal diameter (image 69,
series 601), previously, 2.7 cm and approximately 3.2 cm in greatest
oblique short axis axial diameter (image 139, series 4, previously,
2.4 cm.

There has been interval development of a moderate amount of
eccentric mural thrombus within the distal aspect of the false lumen
(image 140, series 4). No definitive periaortic stranding.

There is a minimal amount of eccentric mixed calcified and
noncalcified atherosclerotic plaque within the distal aspect of the
abdominal aorta which remains tortuous but of normal caliber.

Celiac artery: Supplied by the false lumen. Widely patent without
hemodynamically significant narrowing. Conventional branching
pattern.

SMA: The dissection abuts the origin of the SMA and mildly narrows
the vessel at this location (representative axial image 134, series
4, sagittal image 80, series 602) though does not definitely result
in a hemodynamically significant stenosis. There is no extension of
the dissection into the SMA. Conventional branching pattern. The
distal tributaries of the SMA remain patent.

Right Renal artery: Solitary; supplied by the true lumen. Widely
patent without a hemodynamically significant narrowing.

Left Renal artery: Solitary; as detailed above, there is extension
of the dissection to the level of the left renal artery. The
dissection abuts and narrows the origin of the left renal artery
(axial image 144, series 4, coronal image 68, series 601). There is
no definitive extension of the dissection into the left renal
artery.

IMA: Remains patent throughout its imaged course.

Pelvic vasculature: There is a moderate amount of eccentric mixed
calcified and noncalcified atherosclerotic plaque scatter within in
the bilateral common and external iliac arteries, not definitely
resulting in hemodynamically significant stenosis. There is mild
ectasia of the left common iliac artery measuring approximately
cm in maximal diameter (image 182, series 4). There is unchanged
ectasia involving the proximal aspect the left internal iliac artery
measuring approximately 1 cm in diameter (image 201, series 4).

Review of the MIP images confirms the above findings.

--------------------------------------------------------------------------------

Nonvascular Findings of the abdomen and pelvis:

Evaluation of the abdominal organs is largely limited to the
arterial phase of enhancement.

Normal hepatic contour. No discrete hyper enhancing hepatic lesions.
Normal appearance of the gallbladder given degree distention. No
radiopaque gallstones. No intra extrahepatic biliary duct
dilatation. No ascites.

There is apparent delayed enhancement involving the superior pole of
the left kidney (image 89, series 601) though overall, the
enhancement of the left kidney is markedly improved since the
[DATE] examination. No definitive asymmetric left-sided renal
atrophy. No definite renal stones in this postcontrast examination.
No urinary obstruction or perinephric stranding.

Normal appearance of the bilateral adrenal glands, pancreas and
spleen.

Moderate colonic stool burden without evidence of enteric
obstruction. Normal appearance of the retrocecal appendix. No
pneumoperitoneum, pneumatosis or portal venous gas.

Scattered shotty retroperitoneal lymph nodes are individually not
enlarged by size criteria with index left-sided periaortic lymph
node measuring approximately 0.7 cm in greatest short axis diameter
(image 151, series 4) though note, evaluation is degraded secondary
to the arterial phase of enhancement as well as lack of significant
mesenteric fat. No definitive retroperitoneal, mesenteric, pelvic or
inguinal lymphadenopathy.

Dystrophic calcification within normal sized prostate gland. Normal
appearance of the urinary bladder given degree distention. A small
amount of free fluid is noted within the pelvic cul-de-sac.

No acute or aggressive osseous abnormalities within the abdomen or
pelvis.

Regional soft tissues appear normal.
IMPRESSION: Vascular Impression of the chest:

1. Chronic type B thoracic aortic dissection extending throughout
the thoracic aorta. There is no extension of the thoracic aortic
dissection to involve the great vessels of the aortic arch.
2. Increased size of the false lumen of the dissection with
corresponding increased aneurysmal dilatation of the aortic arch and
descending thoracic aorta. The aortic arch has most significantly
increased in size in the interval, currently measuring 51 mm in
greatest diameter, previously, 41 mm. No definite periaortic
stranding.
3. Unchanged fusiform aneurysmal dilatation of the ascending
thoracic aorta measuring 44 mm in diameter. No definite dissection
involving the ascending thoracic aorta on this nongated examination.
4. Cardiomegaly with enlargement of the caliber the main pulmonary
artery, nonspecific though could be seen in the setting of pulmonary
arterial hypertension. Further evaluation cardiac echo could be
performed as clinically indicated.
Nonvascular Impression of the chest:

1. Increased conspicuity and confluence of ill-defined soft tissue
within the anterior mediastinum, currently measuring approximately
5.8 x 1.3 cm, previously, 4.1 x 0.8 cm - the while potentially
reactive adenopathy, additional differential considerations include
thymoma and lymphoma. Continued attention on follow-up is
recommended.
-------------------------------------------------------------

Vascular Impression of the abdomen and pelvis:

1. Persistent extension of the type B thoracic aortic dissection now
terminating at the takeoff of the left renal artery with development
of an irregular saccular outpouching about the left renal arteries
ostia. Interval development of a moderate amount of eccentric
irregular thrombus within the distal most aspect of the false lumen.
No definite periaortic stranding.
2. Increased aneurysmal dilatation of the supra and perirenal
abdominal aorta now measuring 3.2 cm in diameter, previously, 2.4 cm
3. There is extension of the dissection to about the origin of the
left renal artery resulting in an approximately 50% luminal
narrowing though, there is no definitive extension of the dissection
into the left renal artery.
4. There is extension of the dissection to abut and mildly narrow
the origin of the SMA, not definitely resulting in hemodynamically
significant stenosis. There is no extension of the dissection into
the SMA.
Nonvascular Impression of the abdomen and pelvis:

1. Mildly delayed enhancement involving the superior pole of the
left kidney, markedly improved since the [DATE] examination and
without evidence of asymmetric left-sided renal atrophy.
These results will be called to the ordering clinician or
representative by the Radiologist Assistant, and communication
documented in the PACS or zVision Dashboard.

## 2017-02-25 DIAGNOSIS — I716 Thoracoabdominal aortic aneurysm, without rupture: Secondary | ICD-10-CM | POA: Diagnosis not present

## 2017-02-25 DIAGNOSIS — Z8679 Personal history of other diseases of the circulatory system: Secondary | ICD-10-CM | POA: Diagnosis not present

## 2017-02-25 DIAGNOSIS — N183 Chronic kidney disease, stage 3 (moderate): Secondary | ICD-10-CM | POA: Diagnosis not present

## 2017-02-25 DIAGNOSIS — Z9889 Other specified postprocedural states: Secondary | ICD-10-CM | POA: Diagnosis not present

## 2017-02-25 DIAGNOSIS — Z952 Presence of prosthetic heart valve: Secondary | ICD-10-CM | POA: Diagnosis not present

## 2017-02-25 DIAGNOSIS — I129 Hypertensive chronic kidney disease with stage 1 through stage 4 chronic kidney disease, or unspecified chronic kidney disease: Secondary | ICD-10-CM | POA: Diagnosis not present

## 2017-02-25 DIAGNOSIS — I639 Cerebral infarction, unspecified: Secondary | ICD-10-CM | POA: Diagnosis not present

## 2017-02-25 DIAGNOSIS — N280 Ischemia and infarction of kidney: Secondary | ICD-10-CM | POA: Diagnosis not present

## 2017-03-02 DIAGNOSIS — I716 Thoracoabdominal aortic aneurysm, without rupture: Secondary | ICD-10-CM | POA: Diagnosis not present

## 2017-03-02 DIAGNOSIS — I69351 Hemiplegia and hemiparesis following cerebral infarction affecting right dominant side: Secondary | ICD-10-CM | POA: Diagnosis not present

## 2017-03-02 DIAGNOSIS — Z48812 Encounter for surgical aftercare following surgery on the circulatory system: Secondary | ICD-10-CM | POA: Diagnosis not present

## 2017-03-02 DIAGNOSIS — R1310 Dysphagia, unspecified: Secondary | ICD-10-CM | POA: Diagnosis not present

## 2017-03-02 DIAGNOSIS — I129 Hypertensive chronic kidney disease with stage 1 through stage 4 chronic kidney disease, or unspecified chronic kidney disease: Secondary | ICD-10-CM | POA: Diagnosis not present

## 2017-03-02 DIAGNOSIS — N189 Chronic kidney disease, unspecified: Secondary | ICD-10-CM | POA: Diagnosis not present

## 2017-03-09 IMAGING — CT CT CHEST W/O CM
1 of 3 series · 4 of 36 positions shown, 5 images · non-contrast
Comparison: Prior CT scan of the chest 06/29/2014

CLINICAL DATA: 43-year-old male with left-sided chest pain worse
with inspiration and cough. Medical history includes known thoracic
dissection, acute on chronic renal failure and cardiomyopathy

EXAM:
CT CHEST WITHOUT CONTRAST
TECHNIQUE: Multidetector CT imaging of the chest was performed following the
standard protocol without IV contrast.

[Series 204: cor · coronal · 0.50mm/px · 4 of 108 slices shown, 5 images]
[im 22/108  mediastinal]
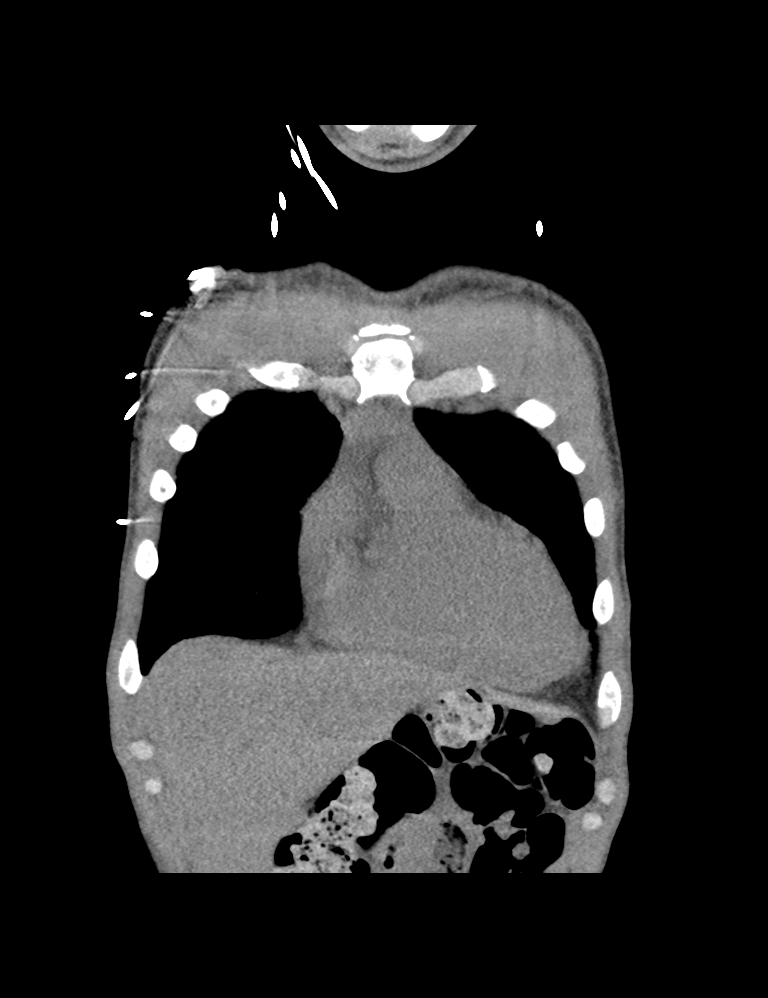
[im 22/108  lung]
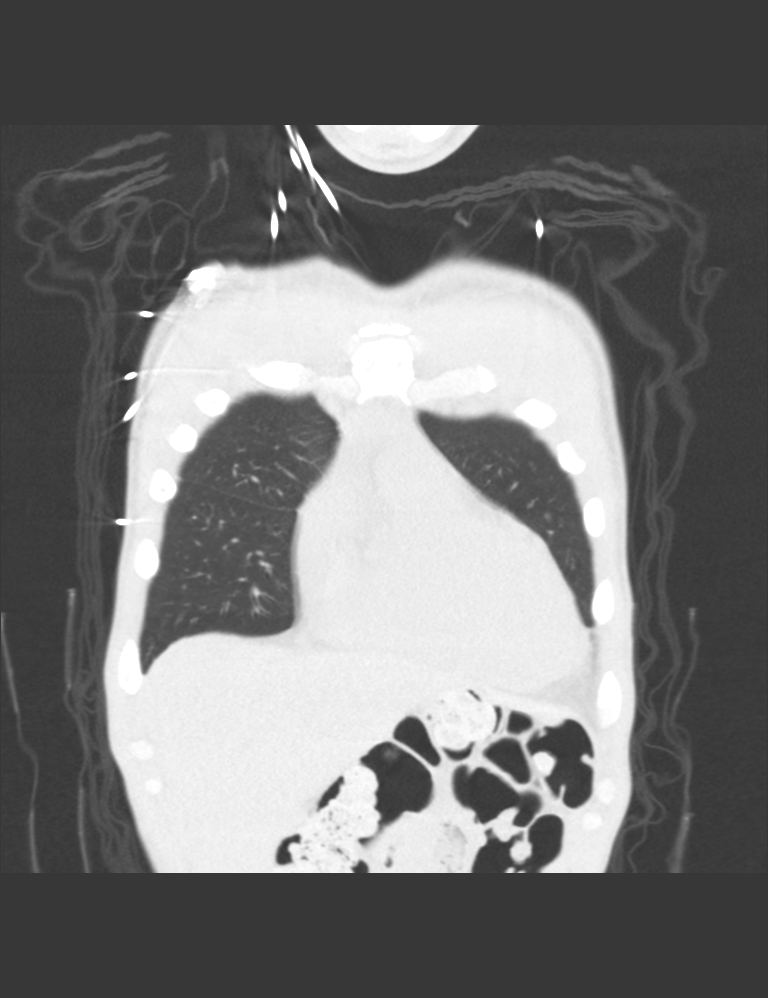
[im 43/108  lung]
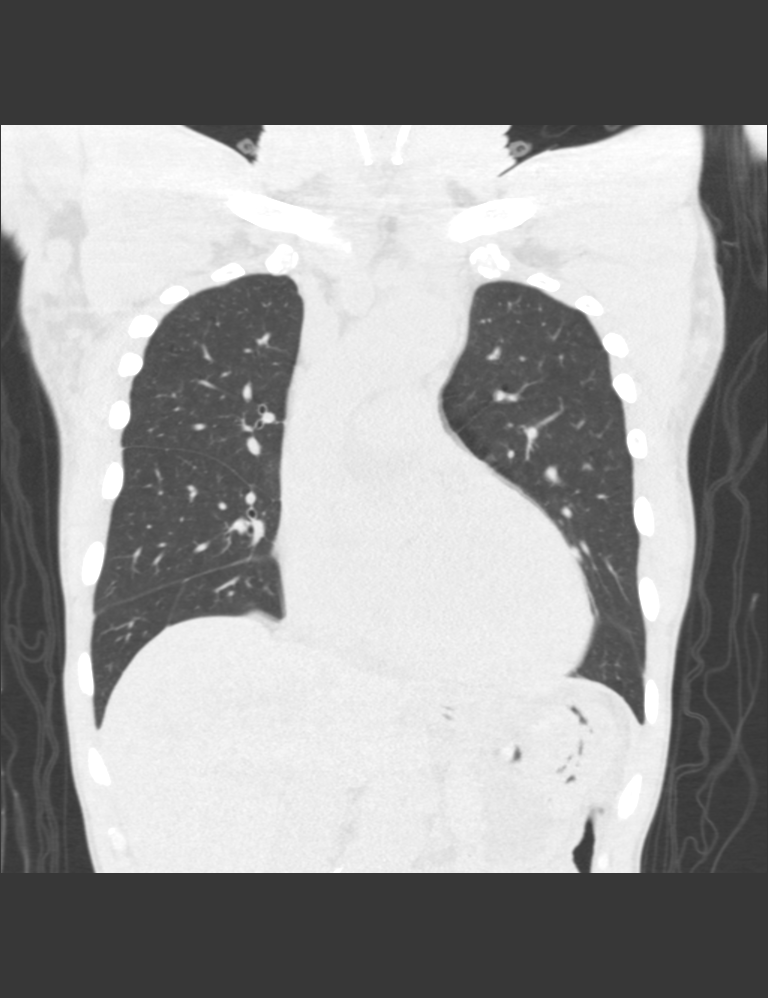
[im 65/108  lung]
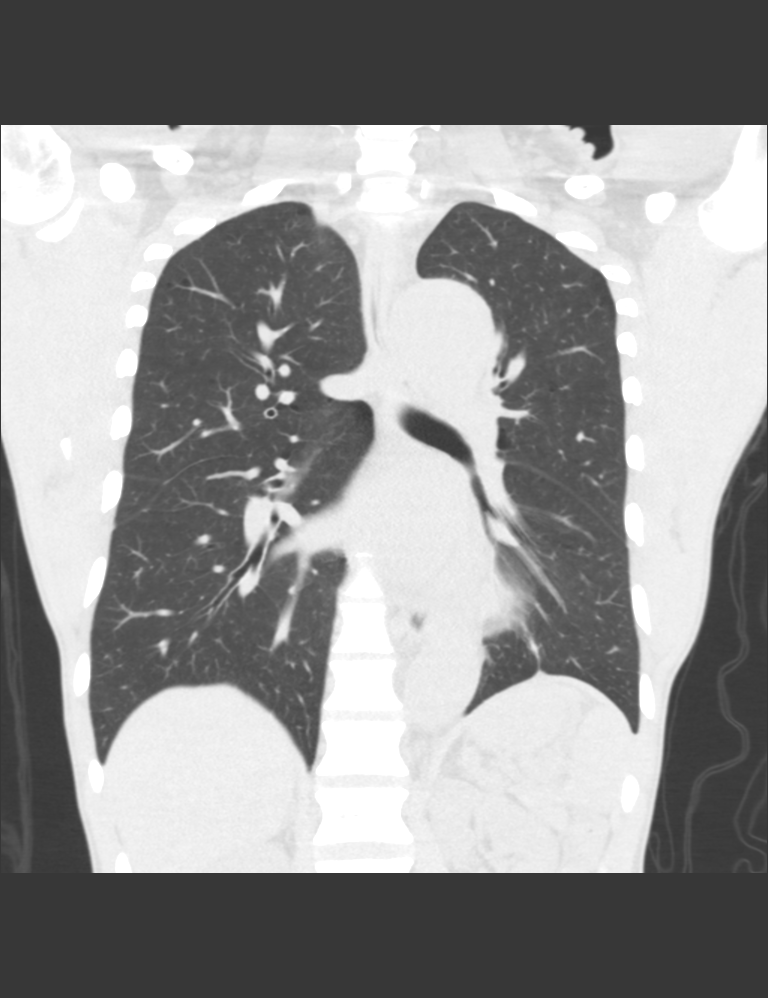
[im 86/108  lung]
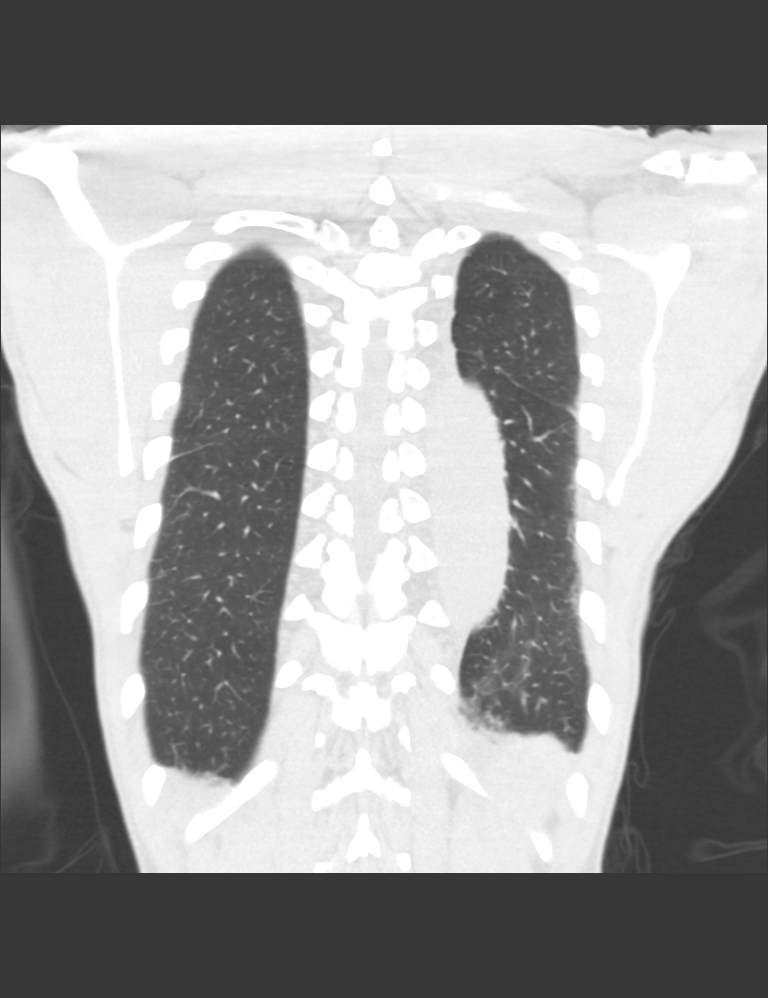

[4 of 36 positions shown; findings below may reference images not displayed]

FINDINGS: Mediastinum: Similar to slightly decreased nonspecific mediastinal
adenopathy. The anterior mediastinal nodal conglomerate measures
x 1.3 cm compared to 5.8 x 1.3 cm previously. No mediastinal
hematoma. Unremarkable thoracic esophagus.

Heart/Vascular: Limited evaluation in the absence of intravenous
contrast. No significant interval enlargement in the diameter of the
transverse aorta the which again measures 5.1 cm in maximal
dimension. No change in the contour or size of the ascending
thoracic aorta to suggest retrograde propagation of the dissection
flap. Ascending thoracic aorta again measures 4.4 cm in diameter. No
evidence of intramural hematoma. Proximal descending thoracic aorta
is unchanged at 4.3 cm. The more distal descending thoracic aorta
remains within normal limits in caliber. Stable cardiomegaly with
left heart enlargement.

Lungs/Pleura: The lungs remain clear.

Bones/Soft Tissues: No acute fracture or aggressive appearing lytic
or blastic osseous lesion.

Upper Abdomen: Visualized upper abdominal organs are unremarkable.
IMPRESSION: 1. Limited evaluation in the absence of intravenous contrast.
2. No significant interval change in the diameter, or configuration
of the thoracic aorta when compared with the prior study from
06/29/2014. No evidence of new mediastinal hematoma or pleural
effusion.
3. Cardiomegaly with left ventricular enlargement.
4. Anterior mediastinal nodal conglomerate is slightly decreased in
size today suggesting reactive adenopathy.

## 2017-03-10 ENCOUNTER — Other Ambulatory Visit: Payer: Self-pay | Admitting: Internal Medicine

## 2017-03-11 DIAGNOSIS — N189 Chronic kidney disease, unspecified: Secondary | ICD-10-CM | POA: Diagnosis not present

## 2017-03-11 DIAGNOSIS — R1310 Dysphagia, unspecified: Secondary | ICD-10-CM | POA: Diagnosis not present

## 2017-03-11 DIAGNOSIS — Z48812 Encounter for surgical aftercare following surgery on the circulatory system: Secondary | ICD-10-CM | POA: Diagnosis not present

## 2017-03-11 DIAGNOSIS — I716 Thoracoabdominal aortic aneurysm, without rupture: Secondary | ICD-10-CM | POA: Diagnosis not present

## 2017-03-11 DIAGNOSIS — I129 Hypertensive chronic kidney disease with stage 1 through stage 4 chronic kidney disease, or unspecified chronic kidney disease: Secondary | ICD-10-CM | POA: Diagnosis not present

## 2017-03-11 DIAGNOSIS — I69351 Hemiplegia and hemiparesis following cerebral infarction affecting right dominant side: Secondary | ICD-10-CM | POA: Diagnosis not present

## 2017-03-14 NOTE — Progress Notes (Signed)
Cardiology Office Note Date:  03/17/2017  Patient ID:  [name], [name] [birth year], MRN [id number] PCP:  [name], MD  Electrophysologist:  Dr. Caryl Comes Nephrologist: Dr. [name]   Chief Complaint:  F/u ILR findings  History of Present Illness: [name] is a 46 y.o. male with history of HTN, hypertensive heart disease with severe LVH, aortic dissection w/hx of stent graft surgery with a bioprosthetic aortic valve>>>. In 09/2014 he underwent aortic valved conduit root replacement with coronary reconstruction (button Bentall procedure) with #27 mm Sorin Mitroflow bovine pericardial vlaved conduit and total arch replacement with individual head replacement by Dr. [name]. He returned to the OR several days later for staged endovascular repair of the descending thoracic aortic aneurysm with intentional L subclavian artery coverage.  He also has CRI, non-obstructive CAD, CM w/recovered EF, anxiety/depression, HLD, remote ETOH abuse, CVA that led to ILR implant.  Most recently is status post open repair of an Extent III TAAA secondary to chronic dissection procedure with a # 41mm Vascutek "Coselli" multibranch Dacron graft with individual celiac, SMA, and bilateral renal artery implantation on 01/04/2017 at [facility]. (Prior Bio-Bentall and total arch replacement followed by TEVAR for distal arch, proximal descending, and aortic root aneurysm repair in setting of chronic Type B dissection 09/2014)  He comes in today to be seen for Dr. Caryl Comes.  I note on 01/20/17 he had brief AF episode, Dr. Caryl Comes felt given brief duration of episode and appeared to be pos-op (re-operation as above) no a/c and monitor for further/longer episodes.  Since his surgery he has had persistent L sided discomfort, is tender and particularly when he has been in any one positions for an extended period, he gives an example of being in a car for a while seems to aggravate this pain.  He is pending a pain management referral via his  PMD office.  He describes occasionally feeling like he gets weak, has to sit to collect himself, feels like his legs turn to rubber (b/l). He worries about his BP, says he gets numbers that are all over the place,  160-170 SBP, as well as 110's.  He saw nephrology a week or so ago who made adjustments to his labetalol, though he has not actually started the new regime yet, he picks up the new Rx today.  We discussed the importance of this.  No syncope.  No CP outside of the left sided torso pain. No palpitations, no SOB.  Device information: MDT ILR implanted 06/19/16, 2/2 cryptogenic stroke   Past Medical History:  Diagnosis Date  . Adenomatous colon polyp 08/2015  . Anxiety   . Aortic disease ([place])   . Aortic dissection ([facility])    a. admx 04/2014 >> L renal infarct; a/c renal failure >> b.  s/p Bioprosthetic Bentall and total arch replacement and staged endovascular repair of descending aortic aneurysm ([facility] - Dr. [name])  . CAD (coronary artery disease)    a. LHC 4/16:  oD1 60%  . Cardiomyopathy ([place])    a. non-ischemic - probably related to untreated HTN and ETOH abuse - Echo 3/13 with EF 35-40% >> b. Echo 4/16: Severe LVH, EF 55-60%, moderate AI, moderate MR, mild LAE, trivial effusion, known type B dissection with communication between true and false lumens with suprasternal images suggesting dissection plane may propagate to at least left subclavian takeoff, root above aortic valve okay    . Chronic abdominal pain   . Chronic combined systolic and diastolic congestive heart failure (  [place])    a. 05/2011: Adm with pulm edema/HTN urgency, EF 35-40% with diffuse hypokinesis and moderate to severe mitral regurgitation. Cardiomyopathy likely due to uncontrolled HTN and ETOH abuse - cath deferred due to renal insufficiency (felt due to uncontrolled HTN). bJodie Echevaria MV 06/2011: EF 37% and no ischemia or infarction. c. EF 45-50% by echo 01/2012.  [name] Chronic sinusitis   . CKD (chronic kidney disease)    a.  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  peak creatinine 3.46 during admx for aortic dissection 2/16.  sees Dr [name]  . Colon polyps 09/11/2015   Descending, sigmoid polyps  . DDD (degenerative disc disease), lumbar   . Depression   . Descending thoracic aortic aneurysm ([place])   . Dissecting aneurysm of thoracic aorta ([place])   . ETOH abuse    a. Reported to have quit 05/2011.  . Frequent headaches   . GERD (gastroesophageal reflux disease)   . Headache(784.0)    "q other day" (08/08/2013)  . Heart murmur   . Hemorrhoid thrombosis   . History of echocardiogram    Echo 1/17:  Severe LVH, EF 55-60%, no RWMA, Gr 2 DD, AVR ok, mild to mod MR, mild LAE, mild reduced RVSF, mod RAE  . History of medication noncompliance   . HYPERLIPIDEMIA   . Hypertension    a. Hx of HTN urgency secondary to noncompliance. b. urinary metanephrine and catecholeamine levels normal 2013.  c. Renal art [name] 1/16:  No evidence of renal artery stenosis noted bilaterally.  . INGUINAL HERNIA   . Pneumonia ~ 2013  . Renal insufficiency   . Stroke ([place])   . Tobacco abuse   . Valvular heart disease    a. Echo 05/2011: moderate to severe eccentric MR and mild to moderate AI with prolapsing left coronary cusp. b. Echo 01/2012: mild-mod AI, mild dilitation of aortic root, mild MR.;  c. Echo 1/16: Severe LVH consistent with hypertrophic cardio myopathy, EF 50%, no RWMA, mod AI, mild MR, mild RAE, dilated Ao root (40 mm);     Past Surgical History:  Procedure Laterality Date  . ANKLE SURGERY Bilateral    Fractures bilaterally  . AORTIC VALVE SURGERY  09/2014  . FOOT FRACTURE SURGERY Bilateral 2004-2010   "got pins in both of them"  . HEMORRHOID SURGERY N/A 06/15/2015   Procedure: HEMORRHOIDECTOMY;  Surgeon: Stark Klein, MD;  Location: [facility];  Service: General;  Laterality: N/A;  . INGUINAL HERNIA REPAIR Right ~ 1996  . LEFT HEART CATHETERIZATION WITH CORONARY ANGIOGRAM N/A 06/21/2014   Procedure: LEFT HEART CATHETERIZATION WITH  CORONARY ANGIOGRAM;  Surgeon: Larey Dresser, MD;  Location: [facility] CATH LAB;  Service: Cardiovascular;  Laterality: N/A;  . LOOP RECORDER INSERTION N/A 06/19/2016   Procedure: Loop Recorder Insertion;  Surgeon: Deboraha Sprang, MD;  Location: [facility] CV LAB;  Service: Cardiovascular;  Laterality: N/A;  . TEE WITHOUT CARDIOVERSION N/A 03/12/2016   Procedure: TRANSESOPHAGEAL ECHOCARDIOGRAM (TEE);  Surgeon: Sanda Klein, MD;  Location: [facility] ENDOSCOPY;  Service: Cardiovascular;  Laterality: N/A;    Current Outpatient Medications  Medication Sig Dispense Refill  . amLODipine (NORVASC) 10 MG tablet Take 10 mg by mouth at bedtime.    [name] aspirin 81 MG chewable tablet Chew 81 tablets daily by mouth.    [name] atorvastatin (LIPITOR) 20 MG tablet Take 1 tablet (20 mg total) by mouth daily. 30 tablet 0  . cloNIDine (CATAPRES - DOSED IN MG/24 HR) 0.3 mg/24hr patch Place 0.3 patches once a week  onto the skin.  0  . famotidine (PEPCID) 20 MG tablet Take 1 tablet (20 mg total) by mouth 2 (two) times daily. 30 tablet 0  . hydrALAZINE (APRESOLINE) 50 MG tablet Take 1 tablet (50 mg total) by mouth 2 (two) times daily. 180 tablet 0  . labetalol (NORMODYNE) 300 MG tablet Take 1 tablet by mouth daily.  0  . levETIRAcetam (KEPPRA) 500 MG tablet Take 1 tablet (500 mg total) by mouth 2 (two) times daily. 60 tablet 0  . meclizine (ANTIVERT) 25 MG tablet Take 1 tablet 3 times a day by oral route as needed for dizzyness    . omeprazole (PRILOSEC) 20 MG capsule Take 20 mg daily by mouth.    . pantoprazole (PROTONIX) 40 MG tablet Take 1 tablet (40 mg total) by mouth daily. Overdue Follow-up appt is due must see provider for refills 30 tablet 0  . senna-docusate (SENOKOT-S) 8.6-50 MG tablet Take 2 tablets 2 (two) times daily by mouth.     No current facility-administered medications for this visit.     Allergies:   Imdur [isosorbide nitrate] and Tramadol   Social History:  The patient  reports that he has been smoking  cigarettes.  He has a 5.75 pack-year smoking history. he has never used smokeless tobacco. He reports that he drinks about 0.6 oz of alcohol per week. He reports that he uses drugs. Drug: Marijuana.   Family History:  The patient's family history includes Colon cancer in his paternal uncle; Diabetes in his maternal aunt; Heart attack in his brother; Hypertension in his mother and unknown relative; Lung cancer in his maternal uncle; Stroke in his maternal aunt.  ROS:  Please see the history of present illness.    All other systems are reviewed and otherwise negative.   PHYSICAL EXAM:  VS:  BP (!) 148/88   Pulse 72   Ht 6' (1.829 m)   Wt 150 lb 6.4 oz (68.2 kg)   SpO2 98%   BMI 20.40 kg/m  BMI: Body mass index is 20.4 kg/m. Well nourished, well developed, in no acute distress  HEENT: normocephalic, atraumatic  Neck: no JVD, carotid bruits or masses Cardiac:  RRR; valve is appreciated, prominent, no rubs, or gallops Lungs:  CTA b/l, no wheezing, rhonchi or rales  Abd: soft, nontender MS: no deformity or atrophy Ext: no edema  Skin: warm and dry, no rash Neuro:  No gross deficits appreciated Psych: euthymic mood, full affect  ILR site is stable, no tethering or discomfort   EKG:  Not done today ILR interrogation done today and reviewed by myself: battery is good, R wave is 0.9.  01/04/17 the day of his surgery he had 3 AF episodes and 9 pause episodes.  Pause episodes appear artifactual or perhaps loss of signal, AF appears true. None of either since the day of OR  03/12/16: TEE Study Conclusions - Left ventricle: The cavity size was normal. Wall thickness was   increased in a pattern of severe LVH. There was concentric   hypertrophy. Systolic function was normal. The estimated ejection   fraction was in the range of 55% to 60%. Wall motion was normal;   there were no regional wall motion abnormalities. - Aortic valve: A bioprosthesis was present and functioning   normally. No  evidence of vegetation. - Aorta: S/P ascending aorta graft repair. The sinuses are   preserved and appear to be at the upper limit of normal in size.   The proximal graft  suture line is very prominen, but appears   intact. The aortic arch (graft replacement) is not well seen due   to graft shadowing. There is minimal residual false lumen in the   proximal descending thoracic aorta. In the distal descending   thoracic aorta there is a sizeable false lumen (up to 2.4 cm   thickness), but it appears to be fully thrombosed. - Mitral valve: No evidence of vegetation. There was mild   regurgitation directed centrally. - Left atrium: The atrium was mildly to moderately dilated. No   evidence of thrombus in the atrial cavity or appendage. No   spontaneous echo contrast was observed. - Right atrium: No evidence of thrombus in the atrial cavity or   appendage. No evidence of thrombus in the atrial cavity or   appendage. - Atrial septum: No defect or patent foramen ovale was identified. - Tricuspid valve: No evidence of vegetation. - Pulmonic valve: No evidence of vegetation.  Recent Labs: 01/31/2017: ALT 17; BUN 11; Creatinine, Ser 1.61; Hemoglobin 11.0; Platelets 304; Potassium 4.1; Sodium 140  No results found for requested labs within last 8760 hours.   CrCl cannot be calculated (Patient's most recent lab result is older than the maximum 21 days allowed.).   Wt Readings from Last 3 Encounters:  03/17/17 150 lb 6.4 oz (68.2 kg)  01/31/17 140 lb (63.5 kg)  12/27/16 152 lb (68.9 kg)     Other studies reviewed: Additional studies/records reviewed today include: summarized above  ASSESSMENT AND PLAN:  1. Cryptogenic stroke w/ILR     PAF noted     Dr. Caryl Comes was already aware, given occurred in the environment of his surgery, no new recommendations     Continue monthly ILR transmissions  2. Hypertensive heart disease     Orthostatic symptoms     BP not controlled     Orthostatic  vitals noted with clear change in his BP, increase in HR with drop in BP noted.     I discussed with Dr. Caryl Comes who recommended add on small aldactone dose, though after that the patient made me aware he sees Dr. [name] his nephrologist who has been managing his BP     He saw him only a week or so ago with adjustments to his labetalol though the patient had not yet started the new regime.     He will today start the new labetalol dose (higher), and follow up with Dr. [name].  Recommend he continue close f/u of this with Dr. [name], he reports he sees him again in 2-3 weeks.    Disposition: F/u with monthly ILR transmissions, 6 months in-clinic, sooner if needed.  Current medicines are reviewed at length with the patient today.  The patient did not have any concerns regarding medicines.  Venetia Night, PA-C 03/17/2017 9:56 AM     [facility] [address] [place] [address] [phone] (office)  [phone] (fax)

## 2017-03-17 ENCOUNTER — Ambulatory Visit (INDEPENDENT_AMBULATORY_CARE_PROVIDER_SITE_OTHER): Payer: Medicare HMO | Admitting: *Deleted

## 2017-03-17 ENCOUNTER — Ambulatory Visit (INDEPENDENT_AMBULATORY_CARE_PROVIDER_SITE_OTHER): Payer: Medicare HMO | Admitting: Physician Assistant

## 2017-03-17 ENCOUNTER — Encounter: Payer: Self-pay | Admitting: Physician Assistant

## 2017-03-17 VITALS — BP 148/88 | HR 72 | Ht 72.0 in | Wt 150.4 lb

## 2017-03-17 DIAGNOSIS — I10 Essential (primary) hypertension: Secondary | ICD-10-CM

## 2017-03-17 DIAGNOSIS — Z4509 Encounter for adjustment and management of other cardiac device: Secondary | ICD-10-CM | POA: Diagnosis not present

## 2017-03-17 DIAGNOSIS — R42 Dizziness and giddiness: Secondary | ICD-10-CM

## 2017-03-17 DIAGNOSIS — I639 Cerebral infarction, unspecified: Secondary | ICD-10-CM | POA: Diagnosis not present

## 2017-03-17 DIAGNOSIS — I63412 Cerebral infarction due to embolism of left middle cerebral artery: Secondary | ICD-10-CM

## 2017-03-17 NOTE — Patient Instructions (Addendum)
Medication Instructions:   Your physician recommends that you continue on your current medications as directed. Please refer to the Current Medication list given to you today.   If you need a refill on your cardiac medications before your next appointment, please call your pharmacy.  Labwork:  NONE ORDERED  TODAY    Testing/Procedures: NONE ORDERED  TODAY    Follow-Up:  Your physician wants you to follow-up in:  IN  6  MONTHS WITH DR KLEIN  You will receive a reminder letter in the mail two months in advance. If you don't receive a letter, please call our office to schedule the follow-up appointment.      Any Other Special Instructions Will Be Listed Below (If Applicable).                                                                                                                                                   

## 2017-03-18 NOTE — Progress Notes (Signed)
[facility] Summary Report / Loop Recorder 

## 2017-03-30 LAB — CUP PACEART REMOTE DEVICE CHECK
Implantable Pulse Generator Implant Date: 20180406
MDC IDC SESS DTM: 20190101230923

## 2017-04-06 NOTE — Progress Notes (Signed)
[facility]  PATIENT: [name] DOB: 03-Aug-1970   REASON FOR VISIT: Follow-up for stroke 02/2016 HISTORY FROM: Patient    HISTORY OF PRESENT ILLNESS:Andre Cordova [name] a 47 y.o.malewith history of aortic dissection s/p repair and reconstruction, systolic and diastolic heart failure, and HTN admitted on 02/28/16 for confusion and right-sided weakness. In ED, had seizure and subsequent cardiac arrest. CT negative for bleeding. CTA without clear LVO, but moderate narrowing of left M2 and right P2. Hewas not considered for IV TPA due to seizure at onset with non-stroke presentation. Intubated and started on Keppra. Post extubation had mild right hemiparesis and word finding difficulty. MRI showed left MCA embolic stroke. EF 55-60%. Negative DVT. LDL 124 and A1C 5.3. Cardiology recommended outpt TEE and 30 day cardiac event monitoring. If monitoring negative, will consider loop recorder. He was discharged with ASA and lipitor without neuro deficit.  3/20/18DR. Xu follow up - the patient has been doing well. No recurrent stroke symptoms. Had 30 day cardiac event monitoring did not show afib. Had outpt TEE which was unremarkable. He was scheduled with [name] for loop recorder. Pt still feels intermittent palpitation. Cut down smoking but not quit yet.  Interval History7/23/18 [name] During the interval time, pt has been doing well from stroke standpoint. He follows with [name] in [facility] and repeat TTE with EF 50%. Bp goal 120/80 due to hx of aortic dissection s/p repair. He continues on Keppra and no more seizure episode. Back to driving earlier this month. Has not quit smoking yet. BP still high 162/91. He said his BP at home similar to this. He had loop recorder with [name] and currently no Afib.  UPDATE 1/23/2019CM [name], 47 year old male returns for follow-up for stroke event in December 2017.  He has been doing well from a stroke standpoint.  He had aortic  aneurysm repair at [facility] in October.  He remains on aspirin and Lipitor for secondary stroke prevention.  He has no bruising and no bleeding.  In addition he denies myalgias.  Blood pressure in the office today 141/80.  He continues to have adjustments in his medication by [name].  He has another appointment tomorrow.  He remains on Keppra 500 twice daily for seizure  prevention without further seizure activity.  He denies side effects to the medication.  He complains of intermittent visual difficulty particularly blurring of vision.  He says he has never had an eye exam.  Loop recorder so far no atrial fibrillation.  Continues to smoke and was encouraged to quit.  Complains of daytime drowsiness memory loss and snoring and depression.  He has never had a sleep study.  He is also trying to get disability.  He returns for reevaluation   REVIEW OF SYSTEMS: Full 14 system review of systems performed and notable only for those listed, all others are neg:  Constitutional: neg  Cardiovascular: neg Ear/Nose/Throat: Hearing loss Skin: neg Eyes: Blurry vision Respiratory: neg Gastroitestinal: neg  Hematology/Lymphatic: neg  Endocrine: Intolerance to heat Musculoskeletal:neg Allergy/Immunology: neg Neurological: Memory loss, seizure weakness Psychiatric: Depression  Sleep : Daytime sleepiness snoring   ALLERGIES: Allergies  Allergen Reactions  . Imdur [Isosorbide Nitrate] Other (See Comments)    headache  . Tramadol Other (See Comments)    "headaches" per pt    HOME MEDICATIONS: Outpatient Medications Prior to Visit  Medication Sig Dispense Refill  . amLODipine (NORVASC) 10 MG tablet Take 10 mg by mouth at bedtime.    [name]  aspirin 81 MG chewable tablet Chew 81 tablets daily by mouth.    [name] atorvastatin (LIPITOR) 20 MG tablet Take 1 tablet (20 mg total) by mouth daily. 30 tablet 0  . cloNIDine (CATAPRES - DOSED IN MG/24 HR) 0.3 mg/24hr patch Place 0.3 patches once a week onto the skin.  0  .  hydrALAZINE (APRESOLINE) 50 MG tablet Take 1 tablet (50 mg total) by mouth 2 (two) times daily. 180 tablet 0  . labetalol (NORMODYNE) 300 MG tablet Take 1 tablet by mouth daily.  0  . levETIRAcetam (KEPPRA) 500 MG tablet Take 1 tablet (500 mg total) by mouth 2 (two) times daily. 60 tablet 0  . omeprazole (PRILOSEC) 20 MG capsule Take 20 mg daily by mouth.    . famotidine (PEPCID) 20 MG tablet Take 1 tablet (20 mg total) by mouth 2 (two) times daily. (Patient not taking: Reported on 04/07/2017) 30 tablet 0  . meclizine (ANTIVERT) 25 MG tablet Take 1 tablet 3 times a day by oral route as needed for dizzyness    . pantoprazole (PROTONIX) 40 MG tablet Take 1 tablet (40 mg total) by mouth daily. Overdue Follow-up appt is due must see provider for refills (Patient not taking: Reported on 04/07/2017) 30 tablet 0  . senna-docusate (SENOKOT-S) 8.6-50 MG tablet Take 2 tablets 2 (two) times daily by mouth.     No facility-administered medications prior to visit.     PAST MEDICAL HISTORY: Past Medical History:  Diagnosis Date  . Adenomatous colon polyp 08/2015  . Anxiety   . Aortic disease ([place])   . Aortic dissection ([facility])    a. admx 04/2014 >> L renal infarct; a/c renal failure >> b.  s/p Bioprosthetic [place] and total arch replacement and staged endovascular repair of descending aortic aneurysm ([facility] - [name])  . CAD (coronary artery disease)    a. LHC 4/16:  oD1 60%  . Cardiomyopathy ([place])    a. non-ischemic - probably related to untreated HTN and ETOH abuse - Echo 3/13 with EF 35-40% >> b. Echo 4/16: Severe LVH, EF 55-60%, moderate AI, moderate MR, mild LAE, trivial effusion, known type B dissection with communication between true and false lumens with suprasternal images suggesting dissection plane may propagate to at least left subclavian takeoff, root above aortic valve okay    . Chronic abdominal pain   . Chronic combined systolic and diastolic congestive heart failure ([place])    a. 05/2011: Adm  with pulm edema/HTN urgency, EF 35-40% with diffuse hypokinesis and moderate to severe mitral regurgitation. Cardiomyopathy likely due to uncontrolled HTN and ETOH abuse - cath deferred due to renal insufficiency (felt due to uncontrolled HTN). bJodie Echevaria MV 06/2011: EF 37% and no ischemia or infarction. c. EF 45-50% by echo 01/2012.  [name] Chronic sinusitis   . CKD (chronic kidney disease)    a. 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  peak creatinine 3.46 during admx for aortic dissection 2/16.  sees Dr [name]  . Colon polyps 09/11/2015   Descending, sigmoid polyps  . DDD (degenerative disc disease), lumbar   . Depression   . Descending thoracic aortic aneurysm ([place])   . Dissecting aneurysm of thoracic aorta ([place])   . ETOH abuse    a. Reported to have quit 05/2011.  . Frequent headaches   . GERD (gastroesophageal reflux disease)   . Headache(784.0)    "q other day" (08/08/2013)  . Heart murmur   . Hemorrhoid thrombosis   . History of echocardiogram  Echo 1/17:  Severe LVH, EF 55-60%, no RWMA, Gr 2 DD, AVR ok, mild to mod MR, mild LAE, mild reduced RVSF, mod RAE  . History of medication noncompliance   . HYPERLIPIDEMIA   . Hypertension    a. Hx of HTN urgency secondary to noncompliance. b. urinary metanephrine and catecholeamine levels normal 2013.  c. Renal art [name] 1/16:  No evidence of renal artery stenosis noted bilaterally.  . INGUINAL HERNIA   . Pneumonia ~ 2013  . Renal insufficiency   . Stroke ([place])   . Tobacco abuse   . Valvular heart disease    a. Echo 05/2011: moderate to severe eccentric MR and mild to moderate AI with prolapsing left coronary cusp. b. Echo 01/2012: mild-mod AI, mild dilitation of aortic root, mild MR.;  c. Echo 1/16: Severe LVH consistent with hypertrophic cardio myopathy, EF 50%, no RWMA, mod AI, mild MR, mild RAE, dilated Ao root (40 mm);     PAST SURGICAL HISTORY: Past Surgical History:  Procedure Laterality Date  . ANKLE SURGERY Bilateral    Fractures  bilaterally  . AORTIC VALVE SURGERY  09/2014  . FOOT FRACTURE SURGERY Bilateral 2004-2010   "got pins in both of them"  . HEMORRHOID SURGERY N/A 06/15/2015   Procedure: HEMORRHOIDECTOMY;  Surgeon: Stark Klein, MD;  Location: [facility];  Service: General;  Laterality: N/A;  . INGUINAL HERNIA REPAIR Right ~ 1996  . LEFT HEART CATHETERIZATION WITH CORONARY ANGIOGRAM N/A 06/21/2014   Procedure: LEFT HEART CATHETERIZATION WITH CORONARY ANGIOGRAM;  Surgeon: Larey Dresser, MD;  Location: [facility] CATH LAB;  Service: Cardiovascular;  Laterality: N/A;  . LOOP RECORDER INSERTION N/A 06/19/2016   Procedure: Loop Recorder Insertion;  Surgeon: Deboraha Sprang, MD;  Location: [facility] CV LAB;  Service: Cardiovascular;  Laterality: N/A;  . TEE WITHOUT CARDIOVERSION N/A 03/12/2016   Procedure: TRANSESOPHAGEAL ECHOCARDIOGRAM (TEE);  Surgeon: Sanda Klein, MD;  Location: [facility] ENDOSCOPY;  Service: Cardiovascular;  Laterality: N/A;    FAMILY HISTORY: Family History  Problem Relation Age of Onset  . Hypertension Mother   . Hypertension Unknown   . Colon cancer Paternal Uncle   . Stroke Maternal Aunt   . Heart attack Brother   . Diabetes Maternal Aunt   . Lung cancer Maternal Uncle   . Throat cancer Neg Hx   . Pancreatic cancer Neg Hx   . Esophageal cancer Neg Hx   . Kidney disease Neg Hx   . Liver disease Neg Hx     SOCIAL HISTORY: Social History   Socioeconomic History  . Marital status: Married    Spouse name: Not on file  . Number of children: 5  . Years of education: 18  . Highest education level: Not on file  Social Needs  . Financial resource strain: Not on file  . Food insecurity - worry: Not on file  . Food insecurity - inability: Not on file  . Transportation needs - medical: Not on file  . Transportation needs - non-medical: Not on file  Occupational History  . Occupation: Disabled, part-time Programmer, systems    Comment: Disability  Tobacco Use  . Smoking status: Current Some Day Smoker     Packs/day: 0.25    Years: 23.00    Pack years: 5.75    Types: Cigarettes  . Smokeless tobacco: Never Used  . Tobacco comment: 3 to 4 per day  Substance and Sexual Activity  . Alcohol use: Yes    Alcohol/week: 0.6 oz  Types: 1 Cans of beer per week    Comment: occasionally  . Drug use: Yes    Types: Marijuana    Comment: 3 times a month for pain  . Sexual activity: Yes  Other Topics Concern  . Not on file  Social History Narrative   Fun: Enjoy his children     PHYSICAL EXAM  Vitals:   04/07/17 1017  BP: (!) 141/80  Pulse: 89  Weight: 151 lb (68.5 kg)   Body mass index is 20.48 kg/m.  Generalized: Well developed, in no acute distress  Head: normocephalic and atraumatic,. Oropharynx benign  Neck: Supple, no carotid bruits  Cardiac: Regular rate rhythm, no murmur  Musculoskeletal: No deformity   Neurological examination   Mentation: Alert oriented to time, place, history taking. Attention span and concentration appropriate. Recent and remote memory intact.  Follows all commands speech and language fluent.   Cranial nerve II-XII: Fundoscopic exam reveals sharp disc margins.Pupils were equal round reactive to light extraocular movements were full, visual field were full on confrontational test. Facial sensation and strength were normal. hearing was intact to finger rubbing bilaterally. Uvula tongue midline. head turning and shoulder shrug were normal and symmetric.Tongue protrusion into cheek strength was normal. Motor: normal bulk and tone, full strength in the BUE, BLE, fine finger movements normal, no pronator drift. No focal weakness Sensory: normal and symmetric to light touch, pinprick, and  Vibration, and were subjectively decreased on the right normal on the left   Coordination: finger-nose-finger, heel-to-shin bilaterally, no dysmetria, no tremor Reflexes: 1+ upper lobe and symmetric plantar responses were flexor bilaterally. Gait and Station: Rising up from  seated position without assistance, normal stance,  moderate stride, good arm swing, smooth turning, able to perform tiptoe, and heel walking without difficulty. Tandem gait is mildly unsteady.   DIAGNOSTIC DATA (LABS, IMAGING, TESTING) - I reviewed patient records, labs, notes, testing and imaging myself where available.  Lab Results  Component Value Date   WBC 7.3 01/31/2017   HGB 11.0 (L) 01/31/2017   HCT 32.9 (L) 01/31/2017   MCV 92.9 01/31/2017   PLT 304 01/31/2017      Component Value Date/Time   NA 140 01/31/2017 0845   K 4.1 01/31/2017 0845   CL 108 01/31/2017 0845   CO2 23 01/31/2017 0845   GLUCOSE 98 01/31/2017 0845   BUN 11 01/31/2017 0845   CREATININE 1.61 (H) 01/31/2017 0845   CREATININE 1.52 (H) 06/08/2014 1132   CALCIUM 9.6 01/31/2017 0845   PROT 7.8 01/31/2017 0932   ALBUMIN 3.2 (L) 01/31/2017 0932   AST 21 01/31/2017 0932   ALT 17 01/31/2017 0932   ALKPHOS 110 01/31/2017 0932   BILITOT 0.3 01/31/2017 0932   GFRNONAA 51 (L) 01/31/2017 0845   GFRAA 59 (L) 01/31/2017 0845   Lab Results  Component Value Date   CHOL 178 03/04/2016   HDL 30 (L) 03/04/2016   LDLCALC 124 (H) 03/04/2016   TRIG 119 03/04/2016   CHOLHDL 5.9 03/04/2016   Lab Results  Component Value Date   HGBA1C 5.3 03/04/2016      ASSESSMENT AND PLAN 47 y.o. African American male with PMH of aortic dissection s/p repair and reconstruction, systolic and diastolic heart failure, and HTN admitted on 02/28/16 for confusion and right-sided weakness. In ED, had seizure and subsequent cardiac arrest. CT negative for bleeding. CTA without clear LVO, but moderate narrowing of left M2 and right P2. MRI showed left MCA embolic stroke. EF 55-60%. Negative DVT.  LDL 124 and A1C 5.3. He was discharged with ASA and lipitor and keppra without neuro deficit. Had 30 day cardiac event monitoring did not show afib. Had outpt TEE which was unremarkable. He had loop recorder placed with [name] and so far no afib.  Cut down smoking but not quit yet. No more seizure now and back to driving. BP not in good control, goal 120/80 due to hx of aortic dissection s/p repair.   New complaint of daytime somnolence and snoring.  Plan: Stressed the importance of management of risk factors to prevent further stroke Continue aspirin and Lipitor for secondary stroke prevention Stop smoking Loop recorder so far no atrial fibrillation Check BP at home and record and bring over or call [name] for medication adjustment. BP goal 120/80 due to hx of aortic dissection s/p repair today's reading 141/80  continue antihypertensive medications Follow up with your primary care physician for stroke risk factor modification. Recommend maintain blood pressure goal 120/80, diabetes with hemoglobin A1c goal below 7.0% and lipids with LDL cholesterol goal below 70 mg/dL.   exercise by walking,   eat healthy diet with whole grains,  fresh fruits and vegetables See ophthalmologist for vision exam follow up with [name] in [place] 500 mg twice daily,Please maintain seizure precautions, If no more seizure, can consider taper off in  1.5 years  We will check sleep study for daytime somnolence Follow-up in 6 months I explained in particular the risks and ramifications of untreated moderate to severe OSA, especially with respect to cardiovascular disease  including congestive heart failure, difficult to treat hypertension, cardiac arrhythmias, or stroke. Even type 2 diabetes has, in part, been linked to untreated OSA. Symptoms of untreated OSA include daytime sleepiness, memory problems, mood irritability and mood disorder such as depression and anxiety, lack of energy, as well as recurrent headaches, especially morning headaches. I spent 40 minutes in total face to face time with the patient more than 50% of which was spent counseling and coordination of care, reviewing test results reviewing medications and discussing and  reviewing the diagnosis of, throat management of risk factors untreated obstructive sleep apnea and briefly explained the sleep study and further treatment options.  Patient will be evaluated by the sleep physician,answered all questions Dennie Bible, [facility], APRN  [facility] [address], [name], [place] [address] [phone]

## 2017-04-07 ENCOUNTER — Ambulatory Visit: Payer: Medicare HMO | Admitting: Nurse Practitioner

## 2017-04-07 ENCOUNTER — Encounter: Payer: Self-pay | Admitting: Nurse Practitioner

## 2017-04-07 VITALS — BP 141/80 | HR 89 | Wt 151.0 lb

## 2017-04-07 DIAGNOSIS — Z720 Tobacco use: Secondary | ICD-10-CM

## 2017-04-07 DIAGNOSIS — R569 Unspecified convulsions: Secondary | ICD-10-CM

## 2017-04-07 DIAGNOSIS — E785 Hyperlipidemia, unspecified: Secondary | ICD-10-CM | POA: Diagnosis not present

## 2017-04-07 DIAGNOSIS — I10 Essential (primary) hypertension: Secondary | ICD-10-CM | POA: Diagnosis not present

## 2017-04-07 DIAGNOSIS — R0683 Snoring: Secondary | ICD-10-CM | POA: Diagnosis not present

## 2017-04-07 DIAGNOSIS — R400 Somnolence: Secondary | ICD-10-CM

## 2017-04-07 DIAGNOSIS — I63412 Cerebral infarction due to embolism of left middle cerebral artery: Secondary | ICD-10-CM | POA: Diagnosis not present

## 2017-04-07 NOTE — Patient Instructions (Addendum)
Stressed the importance of management of risk factors to prevent further stroke Continue aspirin and Lipitor for secondary stroke prevention Stop smoking Loop recorder so far no atrial fibrillation Check BP at home and record and bring over or call Dr. Harlow Mares for medication adjustment. BP goal 120/80 due to hx of aortic dissection s/p repair today's reading 141/80  continue antihypertensive medications Follow up with your primary care physician for stroke risk factor modification. Recommend maintain blood pressure goal 120/80, diabetes with hemoglobin A1c goal below 7.0% and lipids with LDL cholesterol goal below 70 mg/dL.   exercise by walking,   eat healthy diet with whole grains,  fresh fruits and vegetables See ophthalmologist for vision exam follow up with Dr. [name] in [place] 500 mg twice daily,Please maintain seizure precautions, If no more seizure, can consider taper off in  1.5 years  We will check sleep study for daytime somnolence Follow-up in 6 months I explained in particular the risks and ramifications of untreated moderate to severe OSA, especially with respect to cardiovascular disease  including congestive heart failure, difficult to treat hypertension, cardiac arrhythmias, or stroke. Even type 2 diabetes has, in part, been linked to untreated OSA. Symptoms of untreated OSA include daytime sleepiness, memory problems, mood irritability and mood disorder such as depression and anxiety, lack of energy, as well as recurrent headaches, especially morning headaches.

## 2017-04-08 ENCOUNTER — Emergency Department (HOSPITAL_COMMUNITY)
Admission: EM | Admit: 2017-04-08 | Discharge: 2017-04-08 | Disposition: A | Discharge status: Home / Self Care | Payer: Medicare HMO | Attending: Emergency Medicine | Admitting: Emergency Medicine

## 2017-04-08 ENCOUNTER — Emergency Department (HOSPITAL_COMMUNITY): Payer: Medicare HMO

## 2017-04-08 ENCOUNTER — Other Ambulatory Visit: Payer: Self-pay

## 2017-04-08 ENCOUNTER — Encounter (HOSPITAL_COMMUNITY): Payer: Self-pay | Admitting: *Deleted

## 2017-04-08 DIAGNOSIS — Z79899 Other long term (current) drug therapy: Secondary | ICD-10-CM | POA: Insufficient documentation

## 2017-04-08 DIAGNOSIS — I2510 Atherosclerotic heart disease of native coronary artery without angina pectoris: Secondary | ICD-10-CM | POA: Insufficient documentation

## 2017-04-08 DIAGNOSIS — G8929 Other chronic pain: Secondary | ICD-10-CM | POA: Diagnosis not present

## 2017-04-08 DIAGNOSIS — N189 Chronic kidney disease, unspecified: Secondary | ICD-10-CM | POA: Insufficient documentation

## 2017-04-08 DIAGNOSIS — I129 Hypertensive chronic kidney disease with stage 1 through stage 4 chronic kidney disease, or unspecified chronic kidney disease: Secondary | ICD-10-CM | POA: Diagnosis not present

## 2017-04-08 DIAGNOSIS — Z7982 Long term (current) use of aspirin: Secondary | ICD-10-CM | POA: Insufficient documentation

## 2017-04-08 DIAGNOSIS — F17210 Nicotine dependence, cigarettes, uncomplicated: Secondary | ICD-10-CM | POA: Insufficient documentation

## 2017-04-08 DIAGNOSIS — R109 Unspecified abdominal pain: Secondary | ICD-10-CM | POA: Insufficient documentation

## 2017-04-08 DIAGNOSIS — I160 Hypertensive urgency: Secondary | ICD-10-CM

## 2017-04-08 DIAGNOSIS — R079 Chest pain, unspecified: Secondary | ICD-10-CM | POA: Diagnosis present

## 2017-04-08 LAB — CBC WITH DIFFERENTIAL/PLATELET
Basophils Absolute: 0 10*3/uL (ref 0.0–0.1)
Basophils Relative: 1 %
EOS ABS: 0.2 10*3/uL (ref 0.0–0.7)
Eosinophils Relative: 4 %
HEMATOCRIT: 37.8 % — AB (ref 39.0–52.0)
HEMOGLOBIN: 12.4 g/dL — AB (ref 13.0–17.0)
LYMPHS ABS: 1.2 10*3/uL (ref 0.7–4.0)
Lymphocytes Relative: 27 %
MCH: 30.2 pg (ref 26.0–34.0)
MCHC: 32.8 g/dL (ref 30.0–36.0)
MCV: 92 fL (ref 78.0–100.0)
MONOS PCT: 10 %
Monocytes Absolute: 0.4 10*3/uL (ref 0.1–1.0)
NEUTROS ABS: 2.5 10*3/uL (ref 1.7–7.7)
NEUTROS PCT: 58 %
Platelets: 122 10*3/uL — ABNORMAL LOW (ref 150–400)
RBC: 4.11 MIL/uL — AB (ref 4.22–5.81)
RDW: 13.8 % (ref 11.5–15.5)
WBC: 4.3 10*3/uL (ref 4.0–10.5)

## 2017-04-08 LAB — I-STAT CHEM 8, ED
BUN: 12 mg/dL (ref 6–20)
CHLORIDE: 105 mmol/L (ref 101–111)
CREATININE: 1.6 mg/dL — AB (ref 0.61–1.24)
Calcium, Ion: 1.23 mmol/L (ref 1.15–1.40)
GLUCOSE: 84 mg/dL (ref 65–99)
HEMATOCRIT: 36 % — AB (ref 39.0–52.0)
HEMOGLOBIN: 12.2 g/dL — AB (ref 13.0–17.0)
POTASSIUM: 4.2 mmol/L (ref 3.5–5.1)
Sodium: 142 mmol/L (ref 135–145)
TCO2: 25 mmol/L (ref 22–32)

## 2017-04-08 LAB — I-STAT TROPONIN, ED: TROPONIN I, POC: 0.02 ng/mL (ref 0.00–0.08)

## 2017-04-08 MED ORDER — HYDRALAZINE HCL 20 MG/ML IJ SOLN
10.0000 mg | Freq: Once | INTRAMUSCULAR | Status: AC
Start: 2017-04-08 — End: 2017-04-08
  Administered 2017-04-08: 10 mg via INTRAVENOUS
  Filled 2017-04-08: qty 1

## 2017-04-08 MED ORDER — IOPAMIDOL (ISOVUE-370) INJECTION 76%
INTRAVENOUS | Status: AC
  Administered 2017-04-08: 100 mL
  Filled 2017-04-08: qty 100

## 2017-04-08 MED ORDER — SODIUM CHLORIDE 0.9 % IV BOLUS (SEPSIS)
500.0000 mL | Freq: Once | INTRAVENOUS | Status: AC
  Administered 2017-04-08: 500 mL via INTRAVENOUS

## 2017-04-08 NOTE — ED Notes (Signed)
Patient transported to X-ray 

## 2017-04-08 NOTE — ED Triage Notes (Signed)
States he was given clonidine  In the doctor office.

## 2017-04-08 NOTE — Discharge Instructions (Signed)
Please follow up closely with your doctor for close management of your blood pressure.  Your aneurysm will need to be monitor by your specialist at [facility].  Return if you have any concerns.  Take your blood pressure medications as prescribed.

## 2017-04-08 NOTE — Progress Notes (Signed)
I reviewed above note and agree with the assessment and plan.   Rosalin Hawking, MD PhD Stroke Neurology 04/08/2017 4:21 PM

## 2017-04-08 NOTE — ED Triage Notes (Signed)
States he went to "kidney " doctor sent him to the ed for elevation of hypertension and post -op  followup of surgery in Oct.

## 2017-04-08 NOTE — ED Provider Notes (Signed)
[facility] EMERGENCY DEPARTMENT Provider Note   CSN: [id number] Arrival date & time: 04/08/17  1037     History   Chief Complaint Chief Complaint  Patient presents with  . Hypertension    HPI [name] is a 47 y.o. male.  HPI   47 year old male with history of aortic disease, CAD, cardiomyopathy, chronic abdominal pain, hypertension, alcohol abuse sent here for management of his hypertension.  Pt has hx of aortic dissection s/p repair and reconstruction in 2017, had ?renal aneurysm repair 3 months ago at [facility] and since the surgery he has had recurrent pain to his L chest/abdomen at the surgical site.  Pain worse with movement and sometimes with breathing.  He was seen by his nephrologist a week ago for a check up.  His BP was elevated at that time, he was recommended to return again today for recheck.  BP was found to be in the 606T systolic, pt was given clonidine and recommend to come to the ER for further management.  His target goal BP is 120/80 due to hx of aortic dissection s/p repair.  Pt did report a mild frontal headache this AM but that has since resolved.  He still endorse recurrent pain to L side of chest. Felt that his ribs is sticking out of place.  He denies any new numbness or weakness, no SOB.  Denies lightheadedness or dizziness.  Pt is actively trying to quit smoking, consuming approximately 2-3 cigarette daily.    Past Medical History:  Diagnosis Date  . Adenomatous colon polyp 08/2015  . Anxiety   . Aortic disease ([place])   . Aortic dissection (H[facility])    a. admx 04/2014 >> L renal infarct; a/c renal failure >> b.  s/p Bioprosthetic [place] and total arch replacement and staged endovascular repair of descending aortic aneurysm ([facility] - Dr. [name])  . CAD (coronary artery disease)    a. LHC 4/16:  oD1 60%  . Cardiomyopathy ([place])    a. non-ischemic - probably related to untreated HTN and ETOH abuse - Echo 3/13 with EF 35-40% >> b. Echo 4/16: Severe  LVH, EF 55-60%, moderate AI, moderate MR, mild LAE, trivial effusion, known type B dissection with communication between true and false lumens with suprasternal images suggesting dissection plane may propagate to at least left subclavian takeoff, root above aortic valve okay    . Chronic abdominal pain   . Chronic combined systolic and diastolic congestive heart failure ([place])    a. 05/2011: Adm with pulm edema/HTN urgency, EF 35-40% with diffuse hypokinesis and moderate to severe mitral regurgitation. Cardiomyopathy likely due to uncontrolled HTN and ETOH abuse - cath deferred due to renal insufficiency (felt due to uncontrolled HTN). bJodie Echevaria MV 06/2011: EF 37% and no ischemia or infarction. c. EF 45-50% by echo 01/2012.  [name] Chronic sinusitis   . CKD (chronic kidney disease)    a. 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  peak creatinine 3.46 during admx for aortic dissection 2/16.  sees Dr [name]  . Colon polyps 09/11/2015   Descending, sigmoid polyps  . DDD (degenerative disc disease), lumbar   . Depression   . Descending thoracic aortic aneurysm ([place])   . Dissecting aneurysm of thoracic aorta ([place])   . ETOH abuse    a. Reported to have quit 05/2011.  . Frequent headaches   . GERD (gastroesophageal reflux disease)   . Headache(784.0)    "q other day" (08/08/2013)  . Heart murmur   .  Hemorrhoid thrombosis   . History of echocardiogram    Echo 1/17:  Severe LVH, EF 55-60%, no RWMA, Gr 2 DD, AVR ok, mild to mod MR, mild LAE, mild reduced RVSF, mod RAE  . History of medication noncompliance   . HYPERLIPIDEMIA   . Hypertension    a. Hx of HTN urgency secondary to noncompliance. b. urinary metanephrine and catecholeamine levels normal 2013.  c. Renal art [name] 1/16:  No evidence of renal artery stenosis noted bilaterally.  . INGUINAL HERNIA   . Pneumonia ~ 2013  . Renal insufficiency   . Stroke ([place])   . Tobacco abuse   . Valvular heart disease    a. Echo 05/2011: moderate to severe eccentric MR and  mild to moderate AI with prolapsing left coronary cusp. b. Echo 01/2012: mild-mod AI, mild dilitation of aortic root, mild MR.;  c. Echo 1/16: Severe LVH consistent with hypertrophic cardio myopathy, EF 50%, no RWMA, mod AI, mild MR, mild RAE, dilated Ao root (40 mm);     Patient Active Problem List   Diagnosis Date Noted  . Somnolence, daytime 04/07/2017  . Snoring 04/07/2017  . Cerebral infarction due to embolism of left middle cerebral artery ([place]) 06/03/2016  . History of aortic dissection 06/03/2016  . Palpitation   . Cerebrovascular accident (CVA) due to embolism of left middle cerebral artery ([place])   . Seizures ([place]) 02/28/2016  . HLD (hyperlipidemia) 08/28/2015  . 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[place]) 06/07/2015  . Cardiomyopathy ([place]) 03/01/2015  . Acid reflux 03/01/2015  . Essential hypertension 03/01/2015  . Cardiac chest pain 12/10/2014  . Abdominal pain 10/12/2014  . S/P aortic dissection repair 10/03/2014  . H/O aortic valve replacement 10/03/2014  . CKD (chronic kidney disease) 05/15/2014  . AKI (acute kidney injury) ([place])   . Aortic dissection, thoracoabdominal ([place])   . 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 Aortic dissection, thoracic ([place]) 04/24/2014  . Aortic dissection ([place]) 04/23/2014  . Aneurysm of ascending aorta (HCC) 03/28/2014  . Dyspnea 03/27/2014  . ETOH abuse 09/20/2013  . Malignant hypertension 02/20/2013  . Hypertensive urgency 01/27/2012  . Cardiomyopathy, hypertensive ([place]) 01/26/2012  . AI (aortic insufficiency) 01/26/2012  . MR (mitral regurgitation) 01/26/2012  . Acute renal failure superimposed on stage 3 chronic kidney disease ([place]) 01/26/2012  . Systolic CHF, chronic ([place]) 06/10/2011  . Chronic bronchitis 05/23/2011  . Cardiomegaly - hypertensive 05/22/2011  . Marijuana use 05/22/2011  . Abdominal pain 05/22/2011  . Hypertension, malignant 05/22/2011  . Anxiety and depression 05/22/2011  .  Hyperlipidemia LDL goal <70 08/26/2009  . Tobacco use 08/26/2009  . Headache(784.0) 08/26/2009  . Aortic valve disorder 03/11/2009  . INGUINAL HERNIA 02/18/2009  . Uncontrolled hypertension 01/16/2009    Past Surgical History:  Procedure Laterality Date  . ANKLE SURGERY Bilateral    Fractures bilaterally  . AORTIC VALVE SURGERY  09/2014  . FOOT FRACTURE SURGERY Bilateral 2004-2010   "got pins in both of them"  . HEMORRHOID SURGERY N/A 06/15/2015   Procedure: HEMORRHOIDECTOMY;  Surgeon: Stark Klein, MD;  Location: [facility];  Service: General;  Laterality: N/A;  . INGUINAL HERNIA REPAIR Right ~ 1996  . LEFT HEART CATHETERIZATION WITH CORONARY ANGIOGRAM N/A 06/21/2014   Procedure: LEFT HEART CATHETERIZATION WITH CORONARY ANGIOGRAM;  Surgeon: Larey Dresser, MD;  Location: [facility] CATH LAB;  Service: Cardiovascular;  Laterality: N/A;  . LOOP RECORDER INSERTION N/A 06/19/2016   Procedure: Loop Recorder Insertion;  Surgeon: Revonda Standard  Caryl Comes, MD;  Location: [facility] CV LAB;  Service: Cardiovascular;  Laterality: N/A;  . TEE WITHOUT CARDIOVERSION N/A 03/12/2016   Procedure: TRANSESOPHAGEAL ECHOCARDIOGRAM (TEE);  Surgeon: Sanda Klein, MD;  Location: [facility] ENDOSCOPY;  Service: Cardiovascular;  Laterality: N/A;       Home Medications    Prior to Admission medications   Medication Sig Start Date End Date Taking? Authorizing Provider  amLODipine (NORVASC) 10 MG tablet Take 10 mg by mouth at bedtime.    [provider]  aspirin 81 MG chewable tablet Chew 81 tablets daily by mouth. 01/12/17   [provider]  atorvastatin (LIPITOR) 20 MG tablet Take 1 tablet (20 mg total) by mouth daily. 03/18/16   Hilty, Nadean Corwin, MD  cloNIDine (CATAPRES - DOSED IN MG/24 HR) 0.3 mg/24hr patch Place 0.3 patches once a week onto the skin. 12/29/16   [provider]  famotidine (PEPCID) 20 MG tablet Take 1 tablet (20 mg total) by mouth 2 (two) times daily. Patient not taking: Reported on 04/07/2017  08/11/16   Isla Pence, MD  hydrALAZINE (APRESOLINE) 50 MG tablet Take 1 tablet (50 mg total) by mouth 2 (two) times daily. 05/01/16   Golden Circle, FNP  labetalol (NORMODYNE) 300 MG tablet Take 1 tablet by mouth daily. 03/01/17   [provider]  levETIRAcetam (KEPPRA) 500 MG tablet Take 1 tablet (500 mg total) by mouth 2 (two) times daily. 03/06/16   Verlee Monte, MD  meclizine (ANTIVERT) 25 MG tablet Take 1 tablet 3 times a day by oral route as needed for dizzyness    [provider]  omeprazole (PRILOSEC) 20 MG capsule Take 20 mg daily by mouth. 10/30/15   [provider]  pantoprazole (PROTONIX) 40 MG tablet Take 1 tablet (40 mg total) by mouth daily. Overdue Follow-up appt is due must see provider for refills Patient not taking: Reported on 04/07/2017 09/28/16   Golden Circle, FNP  senna-docusate (SENOKOT-S) 8.6-50 MG tablet Take 2 tablets 2 (two) times daily by mouth. 01/10/17 01/10/18  [provider]    Family History Family History  Problem Relation Age of Onset  . Hypertension Mother   . Hypertension Unknown   . Colon cancer Paternal Uncle   . Stroke Maternal Aunt   . Heart attack Brother   . Diabetes Maternal Aunt   . Lung cancer Maternal Uncle   . Throat cancer Neg Hx   . Pancreatic cancer Neg Hx   . Esophageal cancer Neg Hx   . Kidney disease Neg Hx   . Liver disease Neg Hx     Social History Social History   Tobacco Use  . Smoking status: Current Some Day Smoker    Packs/day: 0.25    Years: 23.00    Pack years: 5.75    Types: Cigarettes  . Smokeless tobacco: Never Used  . Tobacco comment: 3 to 4 per day  Substance Use Topics  . Alcohol use: Yes    Alcohol/week: 0.6 oz    Types: 1 Cans of beer per week    Comment: occasionally  . Drug use: Yes    Types: Marijuana    Comment: 3 times a month for pain     Allergies   Imdur [isosorbide nitrate] and Tramadol   Review of Systems Review of Systems  All other  systems reviewed and are negative.    Physical Exam Updated Vital Signs BP (!) 168/94 (BP Location: Right Arm)   Pulse 67   Temp  99 F (37.2 C) (Oral)   Resp 14   Ht 6' (1.829 m)   Wt 68.5 kg (151 lb)   SpO2 100%   BMI 20.48 kg/m   Physical Exam  Constitutional: He is oriented to person, place, and time. He appears well-developed and well-nourished. No distress.  HENT:  Head: Atraumatic.  Eyes: Conjunctivae are normal.  Neck: Neck supple.  Cardiovascular: Normal rate, regular rhythm and intact distal pulses.  Murmur heard. Pulmonary/Chest: Effort normal and breath sounds normal. No respiratory distress. He has no wheezes. He has no rales. He exhibits tenderness (tenderness to L chest on gentle palpation, no focal point tenderness).  Abdominal: Soft. There is tenderness (tenderness to L upper abdomen on palpation without focal point tenderness.  normal appearing abdominal surgical scar).  Musculoskeletal: Normal range of motion. He exhibits no edema.  Neurological: He is alert and oriented to person, place, and time.  Skin: No rash noted.  Psychiatric: He has a normal mood and affect.  Nursing note and vitals reviewed.    ED Treatments / Results  Labs (all labs ordered are listed, but only abnormal results are displayed) Labs Reviewed  CBC WITH DIFFERENTIAL/PLATELET - Abnormal; Notable for the following components:      Result Value   RBC 4.11 (*)    Hemoglobin 12.4 (*)    HCT 37.8 (*)    Platelets 122 (*)    All other components within normal limits  I-STAT CHEM 8, ED - Abnormal; Notable for the following components:   Creatinine, Ser 1.60 (*)    Hemoglobin 12.2 (*)    HCT 36.0 (*)    All other components within normal limits  I-STAT TROPONIN, ED    EKG  EKG Interpretation None     ED ECG REPORT   Date: 04/08/2017  Rate: 64  Rhythm: normal sinus rhythm  QRS Axis: left  Intervals: normal  ST/T Wave abnormalities: anterior Q waves, possibly due to LVH   Conduction Disutrbances:none  Narrative Interpretation:   Old EKG Reviewed: unchanged  I have personally reviewed the EKG tracing and agree with the computerized printout as noted.   Radiology Dg Chest 2 View  Result Date: 04/08/2017 CLINICAL DATA:  Chest pain.  Hypertension. EXAM: CHEST  2 VIEW COMPARISON:  01/31/2017, 08/11/2016.  Chest CT 12/28/2016. FINDINGS: Mediastinum stable. Aortic stent graft noted in stable position. Cardiac monitor device noted. Prior cardiac valve replacement. Stable cardiomegaly. Partial clearing of mild left base atelectasis. Small left pleural effusion. No acute bony abnormality. Surgical clips right chest. IMPRESSION: 1. Partial clearing of mild left base atelectasis. Persistent small left pleural effusion. 2. Aortic stent graft in stable position. Prior cardiac valve replacement. Stable cardiomegaly. No pulmonary venous congestion. Electronically Signed   By: Marcello Moores  Register   On: 04/08/2017 13:20    Procedures Procedures (including critical care time)  Medications Ordered in ED Medications  hydrALAZINE (APRESOLINE) injection 10 mg (not administered)  sodium chloride 0.9 % bolus 500 mL (500 mLs Intravenous New Bag/Given 04/08/17 1245)  iopamidol (ISOVUE-370) 76 % injection (100 mLs  Contrast Given 04/08/17 1320)     Initial Impression / Assessment and Plan / ED Course  I have reviewed the triage vital signs and the nursing notes.  Pertinent labs & imaging results that were available during my care of the patient were reviewed by me and considered in my medical decision making (see chart for details).     BP (!) 179/105   Pulse 65   Temp 99  F (37.2 C) (Oral)   Resp (!) 21   Ht 6' (1.829 m)   Wt 68.5 kg (151 lb)   SpO2 100%   BMI 20.48 kg/m    Final Clinical Impressions(s) / ED Diagnoses   Final diagnoses:  Hypertensive urgency    ED Discharge Orders    None     11:46 AM This pt has significant hx of aortic dissection involving the  renal artery, here with elevated BP as well as recurrent L chest/abd pain since the surgery.  Pain likely 2/2 scar formation. Will obtain Chest/Abd/Pelvis CTA for further care.  [name] current BP is 172/116.  His goal BP is 120/80.    Brief hx, Pt was status post first stage aortic root and total arch replacement followed by second stage distal endovascular repair and left renal artery covered stent placement all performed in 2016.  He had a stroke in Nov 2017.  At [facility] he was diagnosed of Crawford extent type III aneurysm in setting of chronic Type B aortic dissection.    Pt had thoracoabdominal aortic aneurysm repair at [facility] in [facility] 2018.  Pt has recurrent pain to chest/abdomen since surgery without reevaluation.  Now is hypertensive.  Current BP in the 170s, similar to his baseline from prior ER visits. Cr is 1.6.  I discussed with DR. Mesner, and consider obtain chest/abd/pelvis CT to assess for his pain.  IVF given.  Pt otherwise resting comfortably, in no acute discomfort.  3:39 PM CT angiogram of the chest abdomen and pelvis show no acute finding.  Patient's blood pressure improves with 10 mg of hydralazine.  He is asymptomatic. Encourage pt f/u closely with his PCP for close management of his BP.  Also recommend outpt f/u with [facility] for his aneurysm.  Return precaution given.  Pt stable for discharge.     Domenic Moras, PA-C 04/08/17 1546    Mesner, Corene Cornea, MD 04/08/17 [phone]

## 2017-04-14 ENCOUNTER — Telehealth: Payer: Self-pay | Admitting: Cardiology

## 2017-04-14 NOTE — Telephone Encounter (Signed)
LMOVM requesting that pt send manual transmission b/c home monitor has not updated in at least 14 days.    

## 2017-04-15 ENCOUNTER — Ambulatory Visit (INDEPENDENT_AMBULATORY_CARE_PROVIDER_SITE_OTHER): Payer: Medicare HMO | Admitting: *Deleted

## 2017-04-15 DIAGNOSIS — I639 Cerebral infarction, unspecified: Secondary | ICD-10-CM

## 2017-04-16 NOTE — Progress Notes (Signed)
[facility] Summary Report / Loop Recorder 

## 2017-04-21 ENCOUNTER — Encounter: Payer: Self-pay | Admitting: Cardiology

## 2017-04-27 LAB — CUP PACEART REMOTE DEVICE CHECK
Date Time Interrogation Session: 20190131234041
MDC IDC PG IMPLANT DT: 20180406

## 2017-04-28 ENCOUNTER — Encounter: Payer: Self-pay | Admitting: Neurology

## 2017-04-28 ENCOUNTER — Ambulatory Visit (INDEPENDENT_AMBULATORY_CARE_PROVIDER_SITE_OTHER): Payer: Medicare HMO | Admitting: Neurology

## 2017-04-28 VITALS — BP 146/85 | HR 84 | Ht 72.0 in | Wt 146.0 lb

## 2017-04-28 DIAGNOSIS — R351 Nocturia: Secondary | ICD-10-CM

## 2017-04-28 DIAGNOSIS — R519 Headache, unspecified: Secondary | ICD-10-CM

## 2017-04-28 DIAGNOSIS — R0683 Snoring: Secondary | ICD-10-CM

## 2017-04-28 DIAGNOSIS — R400 Somnolence: Secondary | ICD-10-CM

## 2017-04-28 DIAGNOSIS — R0681 Apnea, not elsewhere classified: Secondary | ICD-10-CM

## 2017-04-28 DIAGNOSIS — I63412 Cerebral infarction due to embolism of left middle cerebral artery: Secondary | ICD-10-CM | POA: Diagnosis not present

## 2017-04-28 DIAGNOSIS — R569 Unspecified convulsions: Secondary | ICD-10-CM | POA: Diagnosis not present

## 2017-04-28 DIAGNOSIS — F17200 Nicotine dependence, unspecified, uncomplicated: Secondary | ICD-10-CM

## 2017-04-28 DIAGNOSIS — Z8679 Personal history of other diseases of the circulatory system: Secondary | ICD-10-CM | POA: Diagnosis not present

## 2017-04-28 DIAGNOSIS — R51 Headache: Secondary | ICD-10-CM

## 2017-04-28 NOTE — Progress Notes (Signed)
Subjective:    Patient ID: [name] is a 47 y.o. male.  HPI     Star Age, MD, PhD [facility] [address]O. [place], [place] [address]  Dear [name] and [name],   I saw your patient, [name], upon your kind request in my clinic today for initial consultation of his sleep disorder, in particular, concern for underlying obstructive sleep apnea. The patient is unaccompanied today. As you know, Mr. [name] is a 47 year old right-handed gentleman with an underlying complex medical history of left MCA stroke in 2017, history of seizure, heart disease, history of aortic dissection, anxiety, chronic kidney disease, alcohol abuse, reflux disease, chronic combined systolic and diastolic congestive heart failure, and smoking, who reports snoring and excessive daytime somnolence. I reviewed your office note from 04/07/2017. His wife has reported apneic pauses in his breathing while he is asleep. He has had difficulty sleeping for the past few years, starting with his first aortic aneurysm surgery. He has a TV on in his bedroom but does try to turn the TV off around 10. Bedtime is generally between 10 and 10:30 PM. Right time around 5. He works part-time as a Production designer, theatre/television/film. He is trying to quit smoking. Years ago he was drinking alcohol on a regular basis but reports that he quit about a year ago. He does drink caffeine in the form of soda, 2 16 ounce bottles per day on average. He smokes about 3 cigarettes per day and smokes marijuana about twice a week. His Epworth sleepiness score is 11 out of 24, fatigue score is 43 out of 63. Of note, he has significant nocturia about 4-5 times per average night, he has woken up with a headache. He's not aware of any family history of OSA. He lives at home with his wife and family, he has 5 children, 2 in the household currently. He has lost weight since his surgeries.  His Past Medical History Is Significant  For: Past Medical History:  Diagnosis Date  . Adenomatous colon polyp 08/2015  . Anxiety   . Aortic disease ([place])   . Aortic dissection ([facility])    a. admx 04/2014 >> L renal infarct; a/c renal failure >> b.  s/p Bioprosthetic Bent[place] and total arch replacement and staged endovascular repair of descending aortic aneurysm ([facility] - Dr. [name])  . CAD (coronary artery disease)    a. LHC 4/16:  oD1 60%  . Cardiomyopathy ([place])    a. non-ischemic - probably related to untreated HTN and ETOH abuse - Echo 3/13 with EF 35-40% >> b. Echo 4/16: Severe LVH, EF 55-60%, moderate AI, moderate MR, mild LAE, trivial effusion, known type B dissection with communication between true and false lumens with suprasternal images suggesting dissection plane may propagate to at least left subclavian takeoff, root above aortic valve okay    . Chronic abdominal pain   . Chronic combined systolic and diastolic congestive heart failure ([place])    a. 05/2011: Adm with pulm edema/HTN urgency, EF 35-40% with diffuse hypokinesis and moderate to severe mitral regurgitation. Cardiomyopathy likely due to uncontrolled HTN and ETOH abuse - cath deferred due to renal insufficiency (felt due to uncontrolled HTN). bJodie Echevaria MV 06/2011: EF 37% and no ischemia or infarction. c. EF 45-50% by echo 01/2012.  [name] Chronic sinusitis   . CKD (chronic kidney disease)    a. 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  peak creatinine 3.46 during admx for aortic dissection 2/16.  sees  Dr [name]  . Colon polyps 09/11/2015   Descending, sigmoid polyps  . DDD (degenerative disc disease), lumbar   . Depression   . Descending thoracic aortic aneurysm ([place])   . Dissecting aneurysm of thoracic aorta ([place])   . ETOH abuse    a. Reported to have quit 05/2011.  . Frequent headaches   . GERD (gastroesophageal reflux disease)   . Headache(784.0)    "q other day" (08/08/2013)  . Heart murmur   . Hemorrhoid thrombosis   . History of echocardiogram    Echo 1/17:  Severe LVH,  EF 55-60%, no RWMA, Gr 2 DD, AVR ok, mild to mod MR, mild LAE, mild reduced RVSF, mod RAE  . History of medication noncompliance   . HYPERLIPIDEMIA   . Hypertension    a. Hx of HTN urgency secondary to noncompliance. b. urinary metanephrine and catecholeamine levels normal 2013.  c. Renal art [name] 1/16:  No evidence of renal artery stenosis noted bilaterally.  . INGUINAL HERNIA   . Pneumonia ~ 2013  . Renal insufficiency   . Stroke ([place])   . Tobacco abuse   . Valvular heart disease    a. Echo 05/2011: moderate to severe eccentric MR and mild to moderate AI with prolapsing left coronary cusp. b. Echo 01/2012: mild-mod AI, mild dilitation of aortic root, mild MR.;  c. Echo 1/16: Severe LVH consistent with hypertrophic cardio myopathy, EF 50%, no RWMA, mod AI, mild MR, mild RAE, dilated Ao root (40 mm);     His Past Surgical History Is Significant For: Past Surgical History:  Procedure Laterality Date  . ANKLE SURGERY Bilateral    Fractures bilaterally  . AORTIC VALVE SURGERY  09/2014  . FOOT FRACTURE SURGERY Bilateral 2004-2010   "got pins in both of them"  . HEMORRHOID SURGERY N/A 06/15/2015   Procedure: HEMORRHOIDECTOMY;  Surgeon: Stark Klein, MD;  Location: [facility];  Service: General;  Laterality: N/A;  . INGUINAL HERNIA REPAIR Right ~ 1996  . LEFT HEART CATHETERIZATION WITH CORONARY ANGIOGRAM N/A 06/21/2014   Procedure: LEFT HEART CATHETERIZATION WITH CORONARY ANGIOGRAM;  Surgeon: Larey Dresser, MD;  Location: [facility] CATH LAB;  Service: Cardiovascular;  Laterality: N/A;  . LOOP RECORDER INSERTION N/A 06/19/2016   Procedure: Loop Recorder Insertion;  Surgeon: Deboraha Sprang, MD;  Location: [facility] CV LAB;  Service: Cardiovascular;  Laterality: N/A;  . TEE WITHOUT CARDIOVERSION N/A 03/12/2016   Procedure: TRANSESOPHAGEAL ECHOCARDIOGRAM (TEE);  Surgeon: Sanda Klein, MD;  Location: [facility] ENDOSCOPY;  Service: Cardiovascular;  Laterality: N/A;    His Family History Is Significant For: Family  History  Problem Relation Age of Onset  . Hypertension Mother   . Hypertension Unknown   . Colon cancer Paternal Uncle   . Stroke Maternal Aunt   . Heart attack Brother   . Diabetes Maternal Aunt   . Lung cancer Maternal Uncle   . Throat cancer Neg Hx   . Pancreatic cancer Neg Hx   . Esophageal cancer Neg Hx   . Kidney disease Neg Hx   . Liver disease Neg Hx     His Social History Is Significant For: Social History   Socioeconomic History  . Marital status: Married    Spouse name: None  . Number of children: 5  . Years of education: 46  . Highest education level: None  Social Needs  . Financial resource strain: None  . Food insecurity - worry: None  . Food insecurity - inability: None  . Transportation  needs - medical: None  . Transportation needs - non-medical: None  Occupational History  . Occupation: Disabled, part-time Programmer, systems    Comment: Disability  Tobacco Use  . Smoking status: Current Some Day Smoker    Packs/day: 0.25    Years: 23.00    Pack years: 5.75    Types: Cigarettes  . Smokeless tobacco: Never Used  . Tobacco comment: 3 to 4 per day  Substance and Sexual Activity  . Alcohol use: Yes    Alcohol/week: 0.6 oz    Types: 1 Cans of beer per week    Comment: occasionally  . Drug use: Yes    Types: Marijuana    Comment: 2 times/week  . Sexual activity: Yes  Other Topics Concern  . None  Social History Narrative   Fun: Enjoy his children    His Allergies Are:  Allergies  Allergen Reactions  . Imdur [Isosorbide Nitrate] Other (See Comments)    headache  . Tramadol Other (See Comments)    "headaches" per pt  :   His Current Medications Are:  Outpatient Encounter Medications as of 04/28/2017  Medication Sig  . amLODipine (NORVASC) 10 MG tablet Take 10 mg by mouth daily.   [name] aspirin 81 MG chewable tablet Chew 81 tablets daily by mouth.  [name] atorvastatin (LIPITOR) 20 MG tablet Take 1 tablet (20 mg total) by mouth daily.  [name] labetalol  (NORMODYNE) 300 MG tablet Take 300 mg by mouth daily.   [name] levETIRAcetam (KEPPRA) 500 MG tablet Take 1 tablet (500 mg total) by mouth 2 (two) times daily.  . meclizine (ANTIVERT) 25 MG tablet Take 1 tablet 3 times a day by oral route as needed for dizzyness  . omeprazole (PRILOSEC) 20 MG capsule Take 20 mg daily by mouth.  . senna-docusate (SENOKOT-S) 8.6-50 MG tablet Take 2 tablets 2 (two) times daily by mouth.  . [DISCONTINUED] nitroGLYCERIN (NITRODUR - DOSED IN MG/24 HR) 0.4 mg/hr patch Place 1 patch (0.4 mg total) onto the skin daily.   No facility-administered encounter medications on file as of 04/28/2017.   :  Review of Systems:  Out of a complete 14 point review of systems, all are reviewed and negative with the exception of these symptoms as listed below:  Review of Systems  Neurological:       Pt presents today to discuss his sleep. Pt has never had a sleep study but does endorse snoring.  Epworth Sleepiness Scale 0= would never doze 1= slight chance of dozing 2= moderate chance of dozing 3= high chance of dozing  Sitting and reading: 2 Watching TV: 3 Sitting inactive in a public place (ex. Theater or meeting): 2 As a passenger in a car for an hour without a break: 0 Lying down to rest in the afternoon: 3 Sitting and talking to someone:0 Sitting quietly after lunch (no alcohol): 1 In a car, while stopped in traffic: 0 Total: 11    Objective:  Neurological Exam  Physical Exam Physical Examination:   Vitals:   04/28/17 1332  BP: (!) 146/85  Pulse: 84   General Examination: The patient is a very pleasant 47 y.o. male in no acute distress. He appears well-developed and well-nourished and adequately groomed.   HEENT: Normocephalic, atraumatic, pupils are equal, round and reactive to light and accommodation. Extraocular tracking is good without limitation to gaze excursion or nystagmus noted. Normal smooth pursuit is noted. Hearing is grossly intact. Face is symmetric  with normal facial animation and normal facial  sensation. Speech is clear with no dysarthria noted. There is no hypophonia. There is no lip, neck/head, jaw or voice tremor. Neck is supple with full range of passive and active motion. There are no carotid bruits on auscultation. Oropharynx exam reveals: mild mouth dryness, adequate dental hygiene , some marginal dental hygiene, several missing teeth and moderate airway crowding due to larger tongue, large uvula and tonsils in place of about 1+ bilaterally. Mallampati is class II. Neck circumference is a slender 14-7/8 inches.   Chest: Clear to auscultation without wheezing, rhonchi or crackles noted.  Heart: S1+S2+0, regular and normal without murmurs, rubs or gallops noted.   Abdomen: Soft, non-tender and non-distended with normal bowel sounds appreciated on auscultation.  Extremities: There is no pitting edema in the distal lower extremities bilaterally.  Skin: Warm and dry without trophic changes noted.  Musculoskeletal: exam reveals no obvious joint deformities, tenderness or joint swelling or erythema.   Neurologically:  Mental status: The patient is awake, alert and oriented in all 4 spheres. His immediate and remote memory, attention, language skills and fund of knowledge are appropriate. There is no evidence of aphasia, agnosia, apraxia or anomia. Speech is clear with normal prosody and enunciation. Thought process is linear. Mood is normal and affect is normal.  Cranial nerves II - XII are as described above under HEENT exam. In addition: shoulder shrug is normal with equal shoulder height noted. Motor exam: Normal bulk, strength and tone is noted. There is no drift, tremor or rebound. Romberg is negative. Fine motor skills and coordination: grossly intact.  Cerebellar testing: No dysmetria or intention tremor. There is no truncal or gait ataxia.  Sensory exam: intact to light touch in the upper and lower extremities.  Gait, station and  balance: He stands easily. No veering to one side is noted. No leaning to one side is noted. Posture is age-appropriate and stance is narrow based. No issues walking but tandem walk is challenging for the patient.  Assessment and Plan:   In summary, EMMANUELLE COXE is a very pleasant 47 y.o.-year old male with an underlying complex medical history of left MCA stroke in 2017, history of seizure, heart disease, history of aortic dissection, anxiety, chronic kidney disease, alcohol abuse, reflux disease, chronic combined systolic and diastolic congestive heart failure, and smoking, whose history and physical exam are concerning for obstructive sleep apnea (OSA). I had a long chat with the patient about my findings and the diagnosis of OSA, its prognosis and treatment options. We talked about medical treatments, surgical interventions and non-pharmacological approaches. I explained in particular the risks and ramifications of untreated moderate to severe OSA, especially with respect to developing cardiovascular disease down the Road, including congestive heart failure, difficult to treat hypertension, cardiac arrhythmias, or stroke. Even type 2 diabetes has, in part, been linked to untreated OSA. Symptoms of untreated OSA include daytime sleepiness, memory problems, mood irritability and mood disorder such as depression and anxiety, lack of energy, as well as recurrent headaches, especially morning headaches. We talked about smoking and Marijuana cessation and trying to maintain a healthy lifestyle in general, as well as the importance of weight control. I encouraged the patient to eat healthy, exercise daily and keep well hydrated, to keep a scheduled bedtime and wake time routine, to not skip any meals and eat healthy snacks in between meals. I advised the patient not to drive when feeling sleepy. I recommended the following at this time: sleep study with potential positive airway pressure  titration. (We will  score hypopneas at 4%).   I explained the sleep test procedure to the patient and also outlined possible surgical and non-surgical treatment options of OSA, including the use of a custom-made dental device (which would require a referral to a specialist dentist or oral surgeon), upper airway surgical options, such as pillar implants, radiofrequency surgery, tongue base surgery, and UPPP (which would involve a referral to an ENT surgeon). Rarely, jaw surgery such as mandibular advancement may be considered.  I also explained the CPAP treatment option to the patient, who indicated that he would be willing to try CPAP if the need arises. I explained the importance of being compliant with PAP treatment, not only for insurance purposes but primarily to improve His symptoms, and for the patient's long term health benefit, including to reduce His cardiovascular risks. I answered all his questions today and the patient was in agreement. I would like to see him back after the sleep study is completed and encouraged him to call with any interim questions, concerns, problems or updates.   Thank you very much for allowing me to participate in the care of this nice patient. If I can be of any further assistance to you please do not hesitate to talk to me.  Sincerely,   Star Age, MD, PhD

## 2017-04-28 NOTE — Patient Instructions (Signed)
Thank you for choosing [facility] for your sleep related care! It was nice to meet you today! I appreciate that you entrust me with your sleep related healthcare concerns. I hope, I was able to address at least some of your concerns today, and that I can help you feel reassured and also get better.    Here is what we discussed today and what we came up with as our plan for you:    Based on your symptoms and your exam I believe you are at risk for obstructive sleep apnea or OSA, and I think we should proceed with a sleep study to determine whether you do or do not have OSA and how severe it is. If you have more than mild OSA, I want you to consider treatment with CPAP. Please remember, the risks and ramifications of moderate to severe obstructive sleep apnea or OSA are: Cardiovascular disease, including congestive heart failure, stroke, difficult to control hypertension, arrhythmias, and even type 2 diabetes has been linked to untreated OSA. Sleep apnea causes disruption of sleep and sleep deprivation in most cases, which, in turn, can cause recurrent headaches, problems with memory, mood, concentration, focus, and vigilance. Most people with untreated sleep apnea report excessive daytime sleepiness, which can affect their ability to drive. Please do not drive if you feel sleepy.   I will likely see you back after your sleep study to go over the test results and where to go from there. We will call you after your sleep study to advise about the results (most likely, you will hear from Kristen, my nurse) and to set up an appointment at the time, as necessary.    Our sleep lab administrative assistant will call you to schedule your sleep study. If you don't hear back from her by about 2 weeks from now, please feel free to call her at [phone]. You can leave a message with your phone number and concerns, if you get the voicemail box. She will call back as soon as possible.   

## 2017-04-30 IMAGING — CT CT ANGIO CHEST
2 of 8 series · 13 of 46 positions shown · IV contrast (omnipaque)
Comparison: CTA chest 04/23/2014; 06/29/2014

CLINICAL DATA: Patient with known dissection. Left upper chest and
left arm pain for 7-8 days.

EXAM:
CT ANGIOGRAPHY CHEST, ABDOMEN AND PELVIS
TECHNIQUE: Multidetector CT imaging through the chest, abdomen and pelvis was
performed using the standard protocol during bolus administration of
intravenous contrast. Multiplanar reconstructed images and MIPs were
obtained and reviewed to evaluate the vascular anatomy.
CONTRAST:  100mL OMNIPAQUE IOHEXOL 350 MG/ML SOLN

[Series 5: dissection 2.0 i30f 1 · axial · 0.59mm/px · z∈[+902,+1484]mm · 10 of 347 slices shown]
[im 28/347  lung]
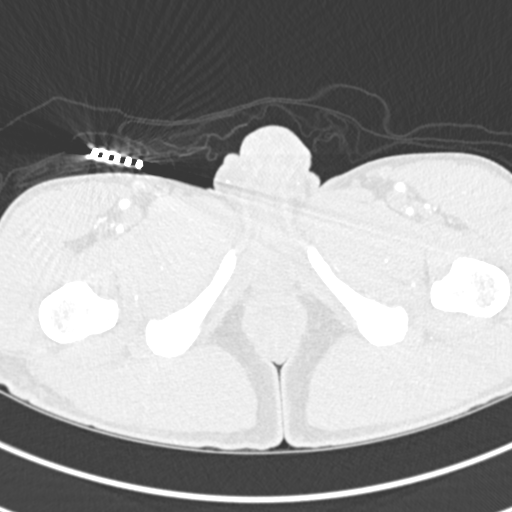
[im 56/347  soft-tissue]
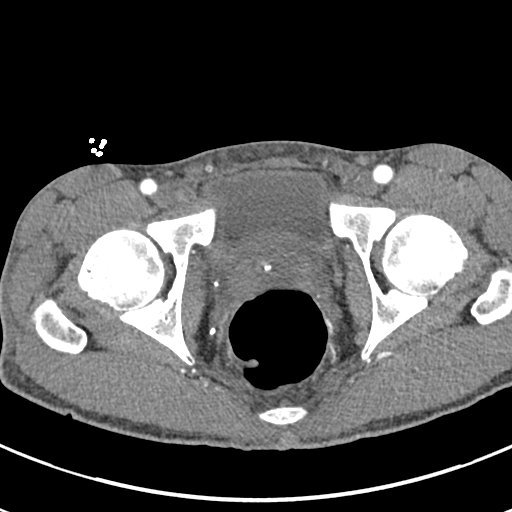
[im 97/347  lung]
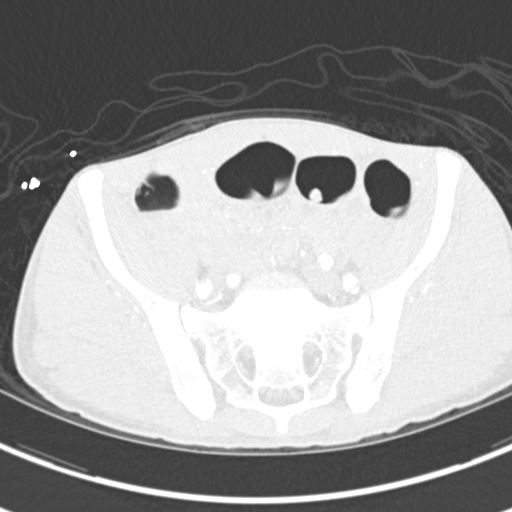
[im 125/347  soft-tissue]
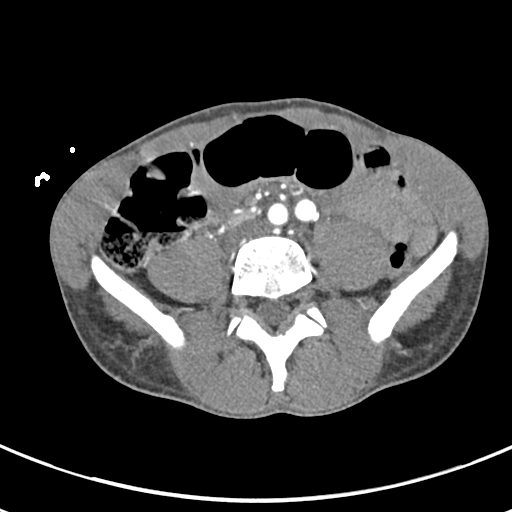
[im 153/347  lung]
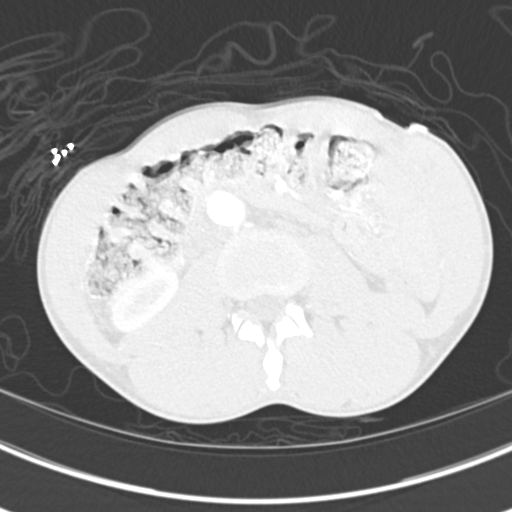
[im 194/347  soft-tissue]
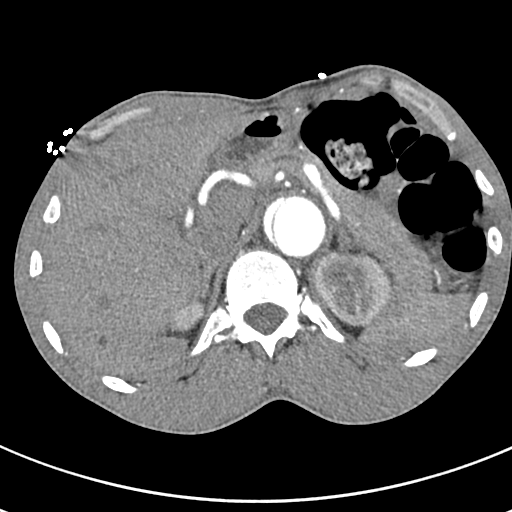
[im 222/347  lung]
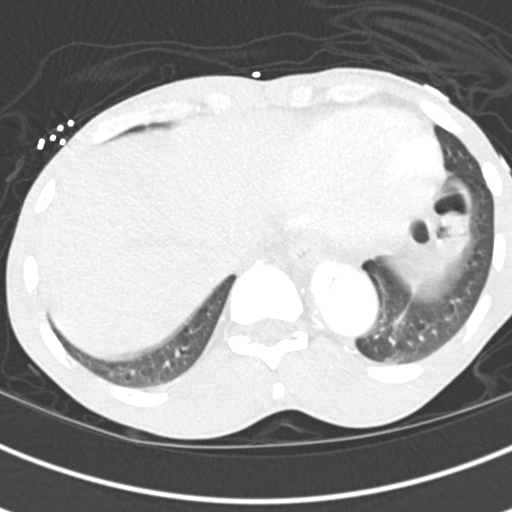
[im 263/347  soft-tissue]
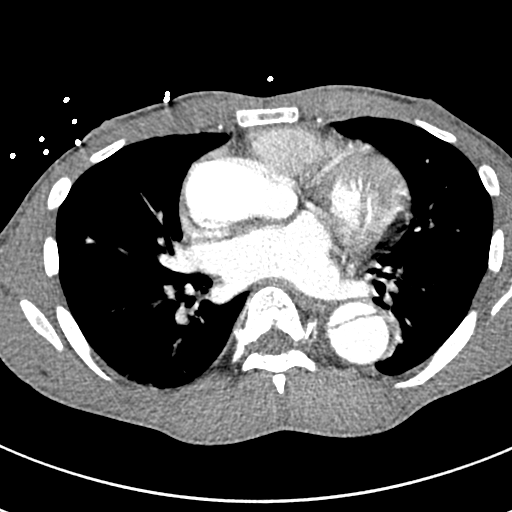
[im 291/347  lung]
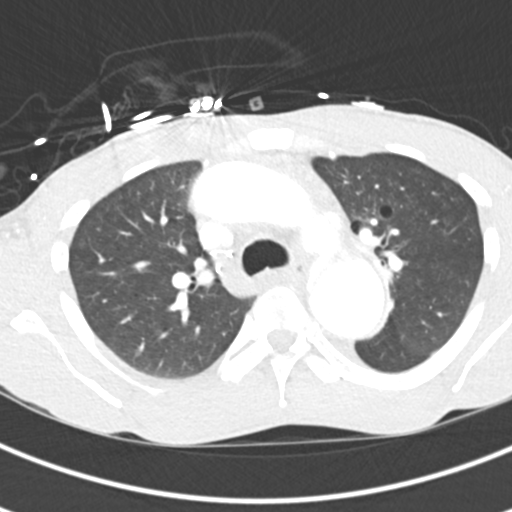
[im 319/347  soft-tissue]
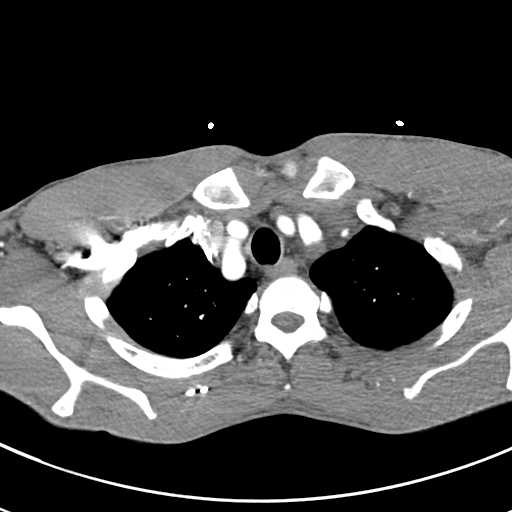

[Series 7: coronal mpr · coronal · 0.57mm/px · 3 of 96 slices shown]
[im 24/96  soft-tissue]
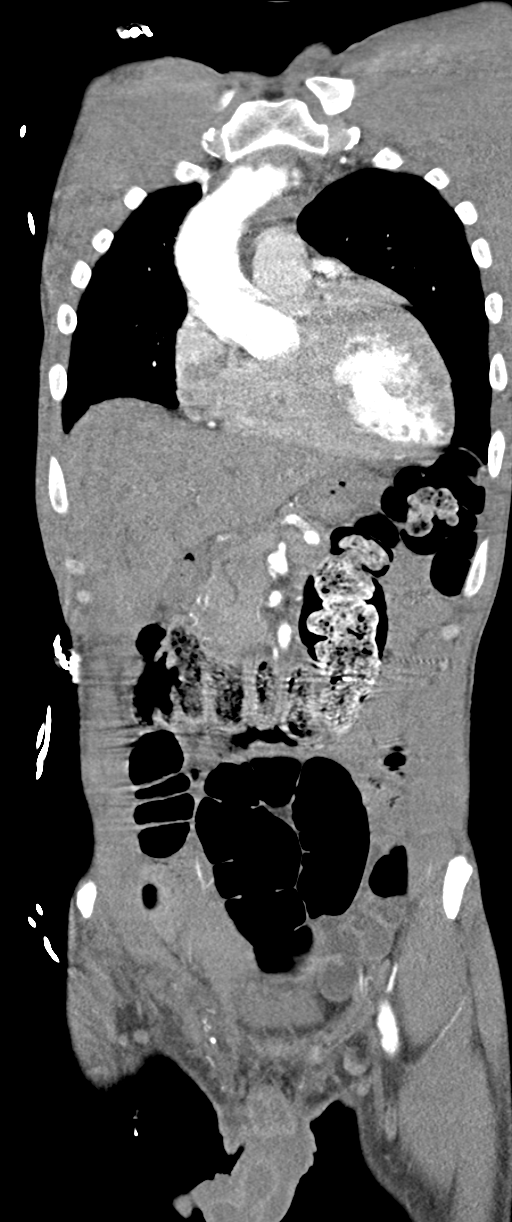
[im 48/96  soft-tissue]
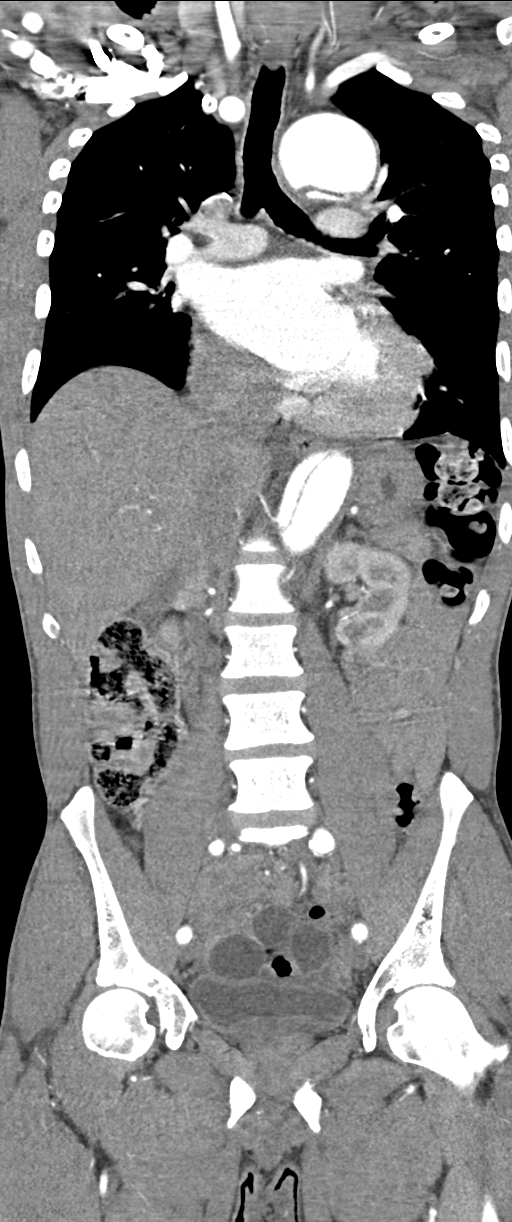
[im 72/96  soft-tissue]
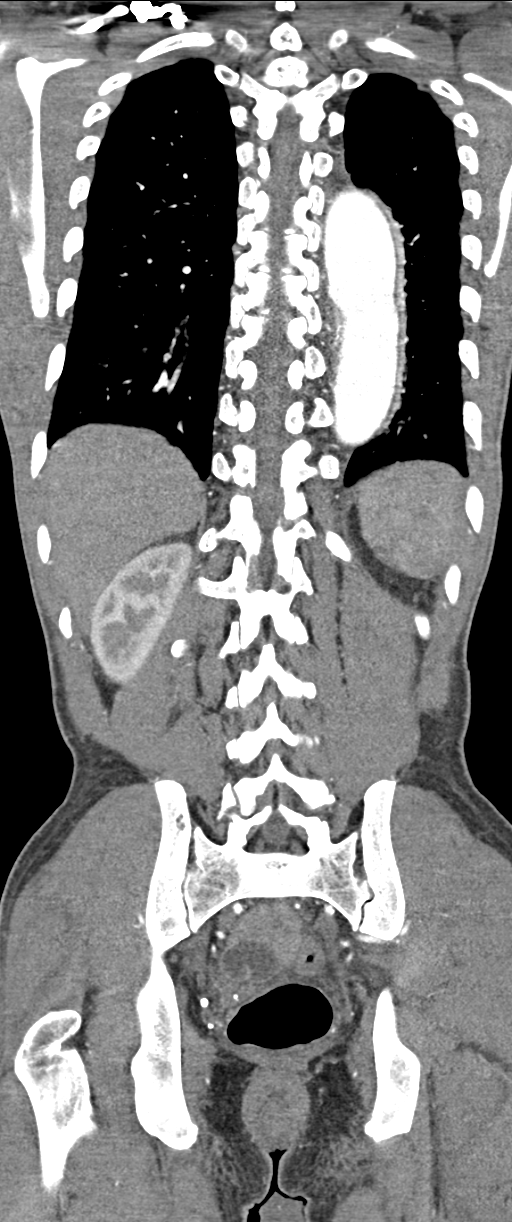

[13 of 46 positions shown; findings below may reference images not displayed]

FINDINGS: CTA CHEST FINDINGS

Re- demonstrated findings compatible with type B dissection. Overall
descending thoracic aorta is grossly similar in size and
configuration compared to prior examination. Proximal descending
thoracic aorta measures 4 cm (image 71; series 5), previously
cm. Transverse thoracic aorta measures 5.1 cm (image 47; series 5),
previously 5.1 cm. Ascending thoracic aorta measures 4.4 cm,
unchanged. Normal configuration of the takeoff of the great vessels.
No evidence for dissection flap extending into the great vessels.
Additionally there is no evidence for ascending thoracic aortic
dissection. The true and false lumens remain patent.

Visualized thyroid is unremarkable. No enlarged axillary,
mediastinal or hilar lymphadenopathy. Grossly unchanged prevascular
soft tissue measuring 5.8 x 1.8 cm (image 60; series 5). Normal
heart size. No pericardial effusion.

Central airways are patent. No large consolidative pulmonary
opacities. No pleural effusion or pneumothorax.

Review of the MIP images confirms the above findings.

CTA ABDOMEN AND PELVIS FINDINGS

Type B dissection appears to terminate at an eccentric saccular
outpouching about the left renal artery, grossly unchanged from
prior. Overall size of the abdominal aorta is grossly similar
compared to prior examination measuring 3.3 cm within the proximal
abdomen (image 147; series 5) and 3.2 cm within the mid abdomen
(image 176; series 5). Unchanged thrombus within the distal false
lumen. There is suggestion of possible extension of the dissection
flap into the proximal superior mesenteric artery (image 171 ;
series 5). The celiac axis remains patent and is supplied by the
false lumen. The left renal artery abuts the dissection which
narrows the origin however the artery is patent. Right renal artery
is patent and supplied by the true lumen. Re- demonstrated ectasia
of the left common iliac artery. Re- demonstrated left internal
iliac artery ectasia.

Phase of contrast limits evaluation of abdomen.

Liver is normal in size and contour without focal hepatic lesion
identified. Gallbladder is unremarkable. Pancreas and spleen are
unremarkable. Normal bilateral adrenal glands. Mild residual delayed
enhancement of superior pole of the left kidney. No hydronephrosis.
No retroperitoneal lymphadenopathy. Urinary bladder is grossly
unremarkable. Prostate unremarkable. No definite evidence for bowel
obstruction. No definite free fluid or free intraperitoneal air. No
aggressive or acute appearing osseous lesions.

Review of the MIP images confirms the above findings.
IMPRESSION: Re- demonstrated Type B thoracic dissection extending throughout the
thoracic aorta into the abdominal aorta, terminating at the level of
the iliac arteries. Suggestion of possible small dissection flap
extending into proximal aspect of the superior mesenteric artery,
new from prior. The superior mesenteric artery is patent.

Otherwise overall similar appearance to dissection and size of the
abdominal aorta without evidence for rupture.

Re- demonstrated ill defined soft tissue within the anterior
mediastinum, nonspecific, potentially representing reactive
adenopathy or thymoma. Continued attention on followup is
recommended.

## 2017-05-05 ENCOUNTER — Telehealth: Payer: Self-pay | Admitting: Cardiology

## 2017-05-05 NOTE — Telephone Encounter (Signed)
Spoke w/ pt and requested that he send a manual transmission b/c his home monitor has not updated in at least 14 days.   

## 2017-05-18 ENCOUNTER — Ambulatory Visit (INDEPENDENT_AMBULATORY_CARE_PROVIDER_SITE_OTHER): Payer: Medicare Other | Admitting: *Deleted

## 2017-05-18 DIAGNOSIS — I639 Cerebral infarction, unspecified: Secondary | ICD-10-CM

## 2017-05-19 ENCOUNTER — Emergency Department (HOSPITAL_COMMUNITY): Payer: Medicare Other

## 2017-05-19 ENCOUNTER — Encounter (HOSPITAL_COMMUNITY): Payer: Self-pay

## 2017-05-19 ENCOUNTER — Telehealth: Payer: Self-pay | Admitting: Cardiology

## 2017-05-19 ENCOUNTER — Other Ambulatory Visit: Payer: Self-pay

## 2017-05-19 ENCOUNTER — Emergency Department (HOSPITAL_COMMUNITY)
Admission: EM | Admit: 2017-05-19 | Discharge: 2017-05-19 | Disposition: A | Discharge status: Home / Self Care | Payer: Medicare Other | Attending: Emergency Medicine | Admitting: Emergency Medicine

## 2017-05-19 DIAGNOSIS — R079 Chest pain, unspecified: Secondary | ICD-10-CM

## 2017-05-19 DIAGNOSIS — I259 Chronic ischemic heart disease, unspecified: Secondary | ICD-10-CM | POA: Insufficient documentation

## 2017-05-19 DIAGNOSIS — R6884 Jaw pain: Secondary | ICD-10-CM

## 2017-05-19 DIAGNOSIS — Z7982 Long term (current) use of aspirin: Secondary | ICD-10-CM | POA: Insufficient documentation

## 2017-05-19 DIAGNOSIS — F17210 Nicotine dependence, cigarettes, uncomplicated: Secondary | ICD-10-CM | POA: Insufficient documentation

## 2017-05-19 DIAGNOSIS — Z79899 Other long term (current) drug therapy: Secondary | ICD-10-CM | POA: Insufficient documentation

## 2017-05-19 DIAGNOSIS — N183 Chronic kidney disease, stage 3 (moderate): Secondary | ICD-10-CM | POA: Diagnosis not present

## 2017-05-19 DIAGNOSIS — I130 Hypertensive heart and chronic kidney disease with heart failure and stage 1 through stage 4 chronic kidney disease, or unspecified chronic kidney disease: Secondary | ICD-10-CM | POA: Insufficient documentation

## 2017-05-19 DIAGNOSIS — I5042 Chronic combined systolic (congestive) and diastolic (congestive) heart failure: Secondary | ICD-10-CM | POA: Diagnosis not present

## 2017-05-19 LAB — I-STAT TROPONIN, ED
TROPONIN I, POC: 0 ng/mL (ref 0.00–0.08)
Troponin i, poc: 0.01 ng/mL (ref 0.00–0.08)

## 2017-05-19 LAB — CBC
HCT: 38.8 % — ABNORMAL LOW (ref 39.0–52.0)
Hemoglobin: 12.9 g/dL — ABNORMAL LOW (ref 13.0–17.0)
MCH: 30.3 pg (ref 26.0–34.0)
MCHC: 33.2 g/dL (ref 30.0–36.0)
MCV: 91.1 fL (ref 78.0–100.0)
PLATELETS: 130 10*3/uL — AB (ref 150–400)
RBC: 4.26 MIL/uL (ref 4.22–5.81)
RDW: 14 % (ref 11.5–15.5)
WBC: 4.9 10*3/uL (ref 4.0–10.5)

## 2017-05-19 LAB — BASIC METABOLIC PANEL
Anion gap: 6 (ref 5–15)
BUN: 12 mg/dL (ref 6–20)
CALCIUM: 9.2 mg/dL (ref 8.9–10.3)
CO2: 24 mmol/L (ref 22–32)
CREATININE: 1.59 mg/dL — AB (ref 0.61–1.24)
Chloride: 109 mmol/L (ref 101–111)
GFR, EST AFRICAN AMERICAN: 59 mL/min — AB (ref >60)
GFR, EST NON AFRICAN AMERICAN: 51 mL/min — AB (ref >60)
Glucose, Bld: 105 mg/dL — ABNORMAL HIGH (ref 65–99)
Potassium: 3.7 mmol/L (ref 3.5–5.1)
SODIUM: 139 mmol/L (ref 135–145)

## 2017-05-19 MED ORDER — OXYCODONE-ACETAMINOPHEN 5-325 MG PO TABS
1.0000 | ORAL_TABLET | Freq: Once | ORAL | Status: AC
  Administered 2017-05-19: 1 via ORAL
  Filled 2017-05-19: qty 1

## 2017-05-19 MED ORDER — MORPHINE SULFATE (PF) 4 MG/ML IV SOLN
4.0000 mg | Freq: Once | INTRAVENOUS | Status: AC
  Administered 2017-05-19: 4 mg via INTRAMUSCULAR
  Filled 2017-05-19: qty 1

## 2017-05-19 MED ORDER — AMOXICILLIN 500 MG PO CAPS: 500.0000 mg | ORAL_CAPSULE | Freq: Three times a day (TID) | ORAL | 0 refills | Status: DC

## 2017-05-19 NOTE — ED Triage Notes (Signed)
Pt endorses left sided chest pain that has been intermittent since last night. Pt had recent teeth pulled and is having jaw pain since then. Pt has hx of aortic aneurysm that was repaired. VSS.

## 2017-05-19 NOTE — ED Provider Notes (Signed)
[facility] EMERGENCY DEPARTMENT Provider Note   CSN: [id number] Arrival date & time: 05/19/17  1039     History   Chief Complaint Chief Complaint  Patient presents with  . Chest Pain    HPI [name] is a 47 y.o. male  with a history of aortic disease s/p surgical intervention, CAD, cardiomyopathy, hypertension, and alcohol abuse who presents the emergency department today for chest pain that has been ongoing since a surgical procedure in October 2018.  Patient with a history of aortic dissection status post repair and reconstruction in 2017 with additional surgical procedure for renal aneurysm repair in October 2018 at [facility].  Patient since states that since his surgical procedure he has had persistent pain at the surgical site, describes this as a pressure, worse with twisting/turning as well as with standing.  States somewhat improved with Tylenol at home.  States it is the same quality pain, states he is here for pain medication regarding his chest.  He states that he has spoken to his surgeon at [facility] who has recommended pain management but has not provided a referral for him.  States he has also had some mild dyspnea which has been present since the surgical procedure, no worsening of this.  Patient denies nausea, vomiting, or diaphoresis.  Patient with additional complaint of left jaw/dental pain radiating to L ear status post multi-tooth extraction 2 weeks prior.  States he has had persistent pain to the jaw the region and feels as if there is a swollen lymph node. States pain is an aching sensation.  Denies fever, chills, neck stiffness, change in voice, or drooling.   Patient has had problems with hypertension over the past few months, states that his systolic blood pressure runs 170-200 typically.  Denies weakness, headache, change in vision.  He has baseline right hand numbness status post CVA, and unchanged.   HPI  Past Medical History:  Diagnosis Date  .  Adenomatous colon polyp 08/2015  . Anxiety   . Aortic disease ([place])   . Aortic dissection (HCC)    a. admx 04/2014 >> L renal infarct; a/c renal failure >> b.  s/p Bioprosthetic Bent[place] and total arch replacement and staged endovascular repair of descending aortic aneurysm ([facility] - Dr. [name])  . CAD (coronary artery disease)    a. LHC 4/16:  oD1 60%  . Cardiomyopathy ([place])    a. non-ischemic - probably related to untreated HTN and ETOH abuse - Echo 3/13 with EF 35-40% >> b. Echo 4/16: Severe LVH, EF 55-60%, moderate AI, moderate MR, mild LAE, trivial effusion, known type B dissection with communication between true and false lumens with suprasternal images suggesting dissection plane may propagate to at least left subclavian takeoff, root above aortic valve okay    . Chronic abdominal pain   . Chronic combined systolic and diastolic congestive heart failure ([place])    a. 05/2011: Adm with pulm edema/HTN urgency, EF 35-40% with diffuse hypokinesis and moderate to severe mitral regurgitation. Cardiomyopathy likely due to uncontrolled HTN and ETOH abuse - cath deferred due to renal insufficiency (felt due to uncontrolled HTN). bJodie Echevaria MV 06/2011: EF 37% and no ischemia or infarction. c. EF 45-50% by echo 01/2012.  [name] Chronic sinusitis   . CKD (chronic kidney disease)    a. 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  peak creatinine 3.46 during admx for aortic dissection 2/16.  sees Dr [name]  . Colon polyps 09/11/2015   Descending, sigmoid polyps  .  DDD (degenerative disc disease), lumbar   . Depression   . Descending thoracic aortic aneurysm ([place])   . Dissecting aneurysm of thoracic aorta ([place])   . ETOH abuse    a. Reported to have quit 05/2011.  . Frequent headaches   . GERD (gastroesophageal reflux disease)   . Headache(784.0)    "q other day" (08/08/2013)  . Heart murmur   . Hemorrhoid thrombosis   . History of echocardiogram    Echo 1/17:  Severe LVH, EF 55-60%, no RWMA, Gr 2 DD, AVR ok, mild to mod  MR, mild LAE, mild reduced RVSF, mod RAE  . History of medication noncompliance   . HYPERLIPIDEMIA   . Hypertension    a. Hx of HTN urgency secondary to noncompliance. b. urinary metanephrine and catecholeamine levels normal 2013.  c. Renal art [name] 1/16:  No evidence of renal artery stenosis noted bilaterally.  . INGUINAL HERNIA   . Pneumonia ~ 2013  . Renal insufficiency   . Stroke ([place])   . Tobacco abuse   . Valvular heart disease    a. Echo 05/2011: moderate to severe eccentric MR and mild to moderate AI with prolapsing left coronary cusp. b. Echo 01/2012: mild-mod AI, mild dilitation of aortic root, mild MR.;  c. Echo 1/16: Severe LVH consistent with hypertrophic cardio myopathy, EF 50%, no RWMA, mod AI, mild MR, mild RAE, dilated Ao root (40 mm);     Patient Active Problem List   Diagnosis Date Noted  . Somnolence, daytime 04/07/2017  . Snoring 04/07/2017  . Cerebral infarction due to embolism of left middle cerebral artery ([place]) 06/03/2016  . History of aortic dissection 06/03/2016  . Palpitation   . Cerebrovascular accident (CVA) due to embolism of left middle cerebral artery ([place])   . Seizures ([place]) 02/28/2016  . HLD (hyperlipidemia) 08/28/2015  . 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[place]) 06/07/2015  . Cardiomyopathy ([place]) 03/01/2015  . Acid reflux 03/01/2015  . Essential hypertension 03/01/2015  . Cardiac chest pain 12/10/2014  . Abdominal pain 10/12/2014  . S/P aortic dissection repair 10/03/2014  . H/O aortic valve replacement 10/03/2014  . CKD (chronic kidney disease) 05/15/2014  . AKI (acute kidney injury) ([place])   . Aortic dissection, thoracoabdominal ([place])   . Dissecting aneurysm of thoracic aorta, Stanford type B ([place]) 04/24/2014  . Aortic dissection, thoracic ([place]) 04/24/2014  . Aortic dissection ([place]) 04/23/2014  . Aneurysm of ascending aorta (HCC) 03/28/2014  . Dyspnea 03/27/2014  . ETOH abuse 09/20/2013  . Malignant hypertension 02/20/2013  . Hypertensive  urgency 01/27/2012  . Cardiomyopathy, hypertensive ([place]) 01/26/2012  . AI (aortic insufficiency) 01/26/2012  . MR (mitral regurgitation) 01/26/2012  . Acute renal failure superimposed on stage 3 chronic kidney disease ([place]) 01/26/2012  . Systolic CHF, chronic ([place]) 06/10/2011  . Chronic bronchitis 05/23/2011  . Cardiomegaly - hypertensive 05/22/2011  . Marijuana use 05/22/2011  . Abdominal pain 05/22/2011  . Hypertension, malignant 05/22/2011  . Anxiety and depression 05/22/2011  . Hyperlipidemia LDL goal <70 08/26/2009  . Tobacco use 08/26/2009  . Headache(784.0) 08/26/2009  . Aortic valve disorder 03/11/2009  . INGUINAL HERNIA 02/18/2009  . Uncontrolled hypertension 01/16/2009    Past Surgical History:  Procedure Laterality Date  . ANKLE SURGERY Bilateral    Fractures bilaterally  . AORTIC VALVE SURGERY  09/2014  . FOOT FRACTURE SURGERY Bilateral 2004-2010   "got pins in both of them"  . HEMORRHOID SURGERY N/A 06/15/2015   Procedure: HEMORRHOIDECTOMY;  Surgeon: Stark Klein, MD;  Location:  [facility] OR;  Service: General;  Laterality: N/A;  . INGUINAL HERNIA REPAIR Right ~ 1996  . LEFT HEART CATHETERIZATION WITH CORONARY ANGIOGRAM N/A 06/21/2014   Procedure: LEFT HEART CATHETERIZATION WITH CORONARY ANGIOGRAM;  Surgeon: Larey Dresser, MD;  Location: [facility] CATH LAB;  Service: Cardiovascular;  Laterality: N/A;  . LOOP RECORDER INSERTION N/A 06/19/2016   Procedure: Loop Recorder Insertion;  Surgeon: Deboraha Sprang, MD;  Location: [facility] CV LAB;  Service: Cardiovascular;  Laterality: N/A;  . TEE WITHOUT CARDIOVERSION N/A 03/12/2016   Procedure: TRANSESOPHAGEAL ECHOCARDIOGRAM (TEE);  Surgeon: Sanda Klein, MD;  Location: [facility] ENDOSCOPY;  Service: Cardiovascular;  Laterality: N/A;       Home Medications    Prior to Admission medications   Medication Sig Start Date End Date Taking? Authorizing Provider  amLODipine (NORVASC) 10 MG tablet Take 10 mg by mouth daily.     [provider]  aspirin 81 MG chewable tablet Chew 81 tablets daily by mouth. 01/12/17   [provider]  atorvastatin (LIPITOR) 20 MG tablet Take 1 tablet (20 mg total) by mouth daily. 03/18/16   Hilty, Nadean Corwin, MD  labetalol (NORMODYNE) 300 MG tablet Take 300 mg by mouth daily.  03/01/17   [provider]  levETIRAcetam (KEPPRA) 500 MG tablet Take 1 tablet (500 mg total) by mouth 2 (two) times daily. 03/06/16   Verlee Monte, MD  meclizine (ANTIVERT) 25 MG tablet Take 1 tablet 3 times a day by oral route as needed for dizzyness    [provider]  omeprazole (PRILOSEC) 20 MG capsule Take 20 mg daily by mouth. 10/30/15   [provider]  senna-docusate (SENOKOT-S) 8.6-50 MG tablet Take 2 tablets 2 (two) times daily by mouth. 01/10/17 01/10/18  [provider]  nitroGLYCERIN (NITRODUR - DOSED IN MG/24 HR) 0.4 mg/hr patch Place 1 patch (0.4 mg total) onto the skin daily. 05/03/14 11/19/14  Orson Eva, MD    Family History Family History  Problem Relation Age of Onset  . Hypertension Mother   . Hypertension Unknown   . Colon cancer Paternal Uncle   . Stroke Maternal Aunt   . Heart attack Brother   . Diabetes Maternal Aunt   . Lung cancer Maternal Uncle   . Throat cancer Neg Hx   . Pancreatic cancer Neg Hx   . Esophageal cancer Neg Hx   . Kidney disease Neg Hx   . Liver disease Neg Hx     Social History Social History   Tobacco Use  . Smoking status: Current Some Day Smoker    Packs/day: 0.25    Years: 23.00    Pack years: 5.75    Types: Cigarettes  . Smokeless tobacco: Never Used  . Tobacco comment: 3 to 4 per day  Substance Use Topics  . Alcohol use: Yes    Alcohol/week: 0.6 oz    Types: 1 Cans of beer per week    Comment: occasionally  . Drug use: Yes    Types: Marijuana    Comment: 2 times/week     Allergies   Imdur [isosorbide nitrate] and Tramadol   Review of Systems Review of Systems  Constitutional: Negative for  fever.  HENT: Positive for congestion, dental problem and ear pain (L). Negative for drooling, sore throat, trouble swallowing and voice change.   Eyes: Negative for visual disturbance.  Respiratory: Positive for shortness of breath (ongoing since surgical procedure).   Cardiovascular: Positive for chest pain (ongoing since surgical procedure).  Gastrointestinal: Negative for nausea and vomiting.  Musculoskeletal: Negative for neck stiffness.  Neurological: Positive for numbness (R hand, chronic unchanged). Negative for dizziness, syncope, weakness and headaches.  All other systems reviewed and are negative.  Physical Exam Updated Vital Signs BP (!) 197/98   Pulse 65   Temp 98 F (36.7 C) (Oral)   Resp 18   Ht 6' (1.829 m)   Wt 66.2 kg (146 lb)   SpO2 99%   BMI 19.80 kg/m   Physical Exam  Constitutional: He appears well-developed and well-nourished.  Non-toxic appearance. No distress.  HENT:  Head: Normocephalic and atraumatic.  Right Ear: Tympanic membrane normal.  Left Ear: Tympanic membrane normal.  Nose: Nose normal.  Mouth/Throat: Uvula is midline and oropharynx is clear and moist. No oropharyngeal exudate or posterior oropharyngeal erythema.  Patient has poor dentition throughout with multiple missing teeth.  There is no gingival erythema, swelling, or palpable fluctuance.  No gross abscess.  Patient is tolerating his own secretions without difficulty.  There is no drooling or trismus.  Submandibular compartment is soft.  Eyes: Conjunctivae are normal. Pupils are equal, round, and reactive to light. Right eye exhibits no discharge. Left eye exhibits no discharge.  Neck: Normal range of motion. Neck supple.  Left submandibular lymphadenopathy  Cardiovascular: Normal rate and regular rhythm.  Murmur (systolic) heard. Pulses:      Radial pulses are 2+ on the right side, and 2+ on the left side.  Pulmonary/Chest: Effort normal and breath sounds normal. No respiratory  distress. He has no wheezes. He has no rhonchi. He has no rales.  There is tenderness to palpation over the left chest and left upper abdomen, no focal tenderness, tenderness locations are consistent with patient's well healed scar.     Abdominal: Soft. He exhibits no distension. There is no rigidity, no rebound and no guarding.  Musculoskeletal: He exhibits no edema.  Lymphadenopathy:    He has no cervical adenopathy.  Neurological: He is alert.  Clear speech.   Skin: Skin is warm and dry. No rash noted.  Psychiatric: He has a normal mood and affect. His behavior is normal.  Nursing note and vitals reviewed.   ED Treatments / Results  Labs Results for orders placed or performed during the hospital encounter of 62/13/08  Basic metabolic panel  Result Value Ref Range   Sodium 139 135 - 145 mmol/L   Potassium 3.7 3.5 - 5.1 mmol/L   Chloride 109 101 - 111 mmol/L   CO2 24 22 - 32 mmol/L   Glucose, Bld 105 (H) 65 - 99 mg/dL   BUN 12 6 - 20 mg/dL   Creatinine, Ser 1.59 (H) 0.61 - 1.24 mg/dL   Calcium 9.2 8.9 - 10.3 mg/dL   GFR calc non Af Amer 51 (L) >60 mL/min   GFR calc Af Amer 59 (L) >60 mL/min   Anion gap 6 5 - 15  CBC  Result Value Ref Range   WBC 4.9 4.0 - 10.5 K/uL   RBC 4.26 4.22 - 5.81 MIL/uL   Hemoglobin 12.9 (L) 13.0 - 17.0 g/dL   HCT 38.8 (L) 39.0 - 52.0 %   MCV 91.1 78.0 - 100.0 fL   MCH 30.3 26.0 - 34.0 pg   MCHC 33.2 30.0 - 36.0 g/dL   RDW 14.0 11.5 - 15.5 %   Platelets 130 (L) 150 - 400 K/uL  I-stat troponin, ED  Result Value Ref Range   Troponin i, poc 0.00 0.00 - 0.08  ng/mL   Comment 3          I-stat troponin, ED  Result Value Ref Range   Troponin i, poc 0.01 0.00 - 0.08 ng/mL   Comment 3           EKG  EKG Interpretation  Date/Time:  Wednesday May 19 2017 11:01:13 EST Ventricular Rate:  77 PR Interval:  154 QRS Duration: 90 QT Interval:  388 QTC Calculation: 439 R Axis:   -17 Text Interpretation:  Normal sinus rhythm Biatrial enlargement  Left ventricular hypertrophy with repolarization abnormality Anterior infarct , age undetermined Abnormal ECG No significant change since last tracing Confirmed by Addison Lank [phone]) on 05/19/2017 11:06:18 AM Also confirmed by Addison Lank [phone]), editor Philomena Doheny [phone])  on 05/19/2017 11:31:13 AM       Radiology Dg Chest 2 View  Result Date: 05/19/2017 CLINICAL DATA:  Left-sided chest pain. Intermittent pain since last night. EXAM: CHEST - 2 VIEW COMPARISON:  CT chest 04/08/2017 FINDINGS: There is blunting of the left costophrenic angle likely reflecting scarring. There is no focal consolidation. There is no pleural effusion or pneumothorax. The heart and mediastinal contours are unremarkable. There is a thoracic aortic stent graft present. There is an aortic valve replacement. There is evidence of prior median sternotomy. There is a implantable loop recorder in the anterior chest wall. The osseous structures are unremarkable. IMPRESSION: No active cardiopulmonary disease. Electronically Signed   By: Kathreen Devoid   On: 05/19/2017 11:58    Procedures Procedures (including critical care time)  Medications Ordered in ED Medications - No data to display   Initial Impression / Assessment and Plan / ED Course  I have reviewed the triage vital signs and the nursing notes.  Pertinent labs & imaging results that were available during my care of the patient were reviewed by me and considered in my medical decision making (see chart for details).   Patient presents with complaint of chest pain.  Patient is nontoxic-appearing, in no apparent distress, vitals are WNL other than elevated blood pressure-per discussion with patient he has had difficulty with blood pressure control recently which is being worked on by his outpatient doctors, states his systolic blood pressure runs 170-200, doubt hypertensive emergency.  His chest pain has been ongoing for several months after a surgical procedure, it  is reproducible with palpation of the surgical scar.  Workup initiated with lab work as well as an EKG and chest x-ray.  Chest x-ray without active cardiopulmonary disease.  EKG appears consistent with previous.  Lab work appears consistent with patient's baseline including hemoglobin of 12.9 as well as creatinine of 1.59. Patient's pain is not exertional, EKG appears unchanged from previous, troponin negative x2, doubt ACS. Patient is PERC negative- doubt pulmonary embolism.  Patient's pain has been persistent since surgical procedure, no significant change, do not suspect acute dissection. Will refer to pain management which patient is in agreement with.   Left lower dental pain and lymphadenopathy-  No gross abscess.  Exam unconcerning for Ludwig's angina or spread of infection.  Will treat with Amoxicillin. Urged patient to follow-up with dentist, dental resources were provided.    Discussed treatment plan and need for follow up as well as return precautions. Provided opportunity for questions, patient confirmed understanding and is agreeable to plan.  Findings and plan of care discussed with supervising physician Dr. Tomi Bamberger who is in agreement.    Final Clinical Impressions(s) / ED Diagnoses   Final diagnoses:  Chest  pain, unspecified type  Jaw pain    ED Discharge Orders        Ordered    amoxicillin (AMOXIL) 500 MG capsule  3 times daily     05/19/17 1725       Cathren Sween, [place], PA-C 05/19/17 1817    Dorie Rank, MD 05/20/17 [phone]

## 2017-05-19 NOTE — Progress Notes (Signed)
[facility] Summary Report / Loop Recorder 

## 2017-05-19 NOTE — Telephone Encounter (Signed)
LMOVM requesting that pt send manual transmission b/c home monitor has not updated in at least 14 days.    

## 2017-05-19 NOTE — ED Notes (Signed)
Pt discharged from ED; instructions provided and scripts given; Pt encouraged to return to ED if symptoms worsen and to f/u with PCP; Pt verbalized understanding of all instructions 

## 2017-05-19 NOTE — Discharge Instructions (Signed)
You were seen in the emergency department today for pain in your chest as well as in your jaw/dental region.  In terms of your chest pain that you are lab work, EKG, and chest x-ray were all reassuring and did not appear significantly different from previous workups you have had.  Provided you with contact information for a pain management clinic, call their office in order to set up an appointment.  Be sure to follow-up with your surgeon as scheduled, or sooner if you feel necessary.  In terms of your jaw/dental pain and swollen lymph nodes we are treating you with amoxicillin, this is an antibiotic- Please take all of your antibiotics until finished. You may develop abdominal discomfort or diarrhea from the antibiotic.  You may help offset this with probiotics which you can buy at the store (ask your pharmacist if unable to find) or get probiotics in the form of eating yogurt. Do not eat or take the probiotics until 2 hours after your antibiotic. If you are unable to tolerate these side effects follow-up with your primary care provider or return to the emergency department.   If you begin to experience any blistering, rashes, swelling, or difficulty breathing seek medical care for evaluation of potentially more serious side effects.   Please be aware that this medication may interact with other medications you are taking, please be sure to discuss your medication list with your pharmacist.    Follow-up with your dentist or with any of the dental clinics provided in your discharge instructions for reevaluation in the next 48 hours.  Be sure to follow-up with the individuals managing her blood pressure as this was elevated in the emergency department today.  Return to the emergency department for any new or worsening symptoms or concerns that you have.

## 2017-05-25 DIAGNOSIS — Z720 Tobacco use: Secondary | ICD-10-CM | POA: Diagnosis not present

## 2017-05-25 DIAGNOSIS — N183 Chronic kidney disease, stage 3 (moderate): Secondary | ICD-10-CM | POA: Diagnosis not present

## 2017-05-25 DIAGNOSIS — I129 Hypertensive chronic kidney disease with stage 1 through stage 4 chronic kidney disease, or unspecified chronic kidney disease: Secondary | ICD-10-CM | POA: Diagnosis not present

## 2017-05-25 DIAGNOSIS — N280 Ischemia and infarction of kidney: Secondary | ICD-10-CM | POA: Diagnosis not present

## 2017-05-25 DIAGNOSIS — I639 Cerebral infarction, unspecified: Secondary | ICD-10-CM | POA: Diagnosis not present

## 2017-05-25 DIAGNOSIS — Z9889 Other specified postprocedural states: Secondary | ICD-10-CM | POA: Diagnosis not present

## 2017-05-25 DIAGNOSIS — I716 Thoracoabdominal aortic aneurysm, without rupture: Secondary | ICD-10-CM | POA: Diagnosis not present

## 2017-05-25 DIAGNOSIS — Z8679 Personal history of other diseases of the circulatory system: Secondary | ICD-10-CM | POA: Diagnosis not present

## 2017-05-25 DIAGNOSIS — Z952 Presence of prosthetic heart valve: Secondary | ICD-10-CM | POA: Diagnosis not present

## 2017-05-25 IMAGING — CT CT ANGIO HEAD
1 of 11 series · 5 of 33 positions shown · IV contrast (OMNI 350)
Comparison: CT head 09/11/2013

CLINICAL DATA: Left-sided headache and neck pain

EXAM:
CT ANGIOGRAPHY HEAD AND NECK
TECHNIQUE: Multidetector CT imaging of the head and neck was performed using
the standard protocol during bolus administration of intravenous
contrast. Multiplanar CT image reconstructions and MIPs were
obtained to evaluate the vascular anatomy. Carotid stenosis
measurements (when applicable) are obtained utilizing NASCET
criteria, using the distal internal carotid diameter as the
denominator.
CONTRAST:  50mL OMNIPAQUE IOHEXOL 350 MG/ML SOLN

[Series 9: axial 1x1 · axial · 0.33mm/px · z∈[+961,+1177]mm · 5 of 326 slices shown]
[im 55/326  soft-tissue]
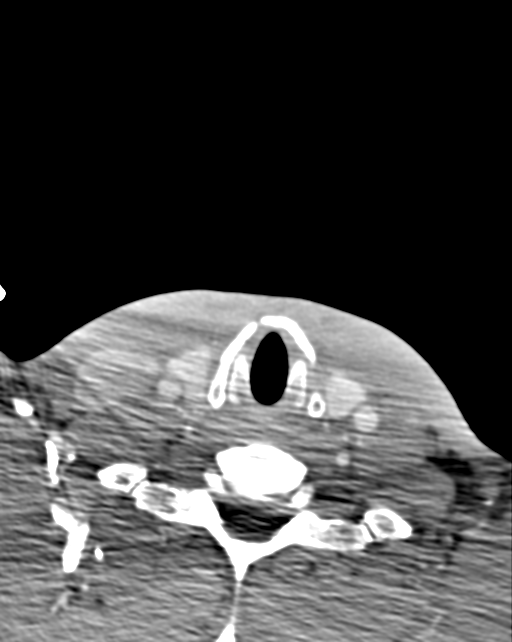
[im 109/326  bone]
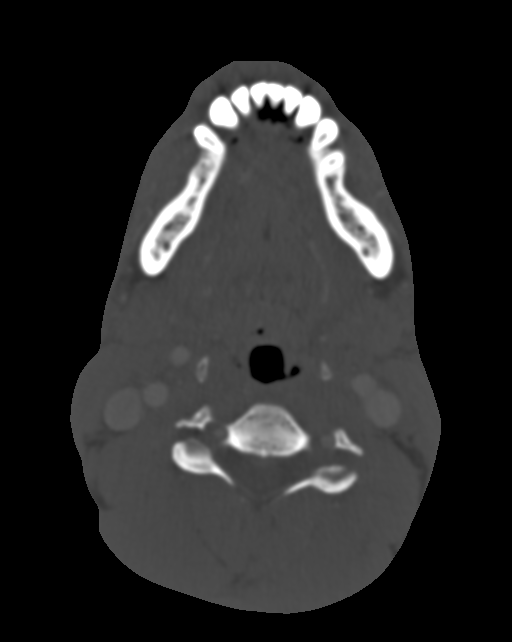
[im 163/326  soft-tissue]
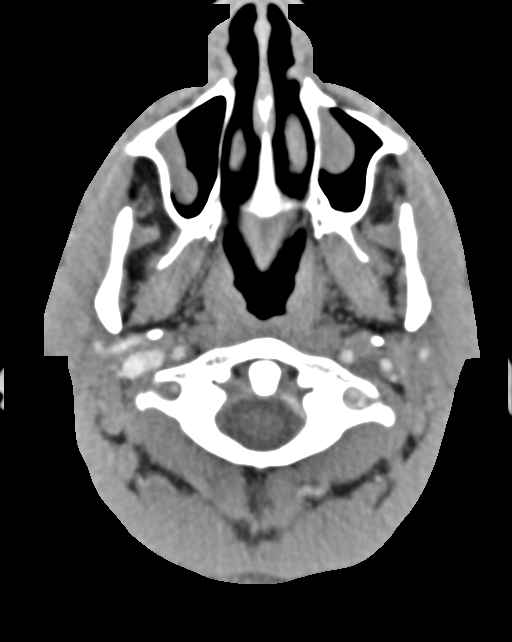
[im 217/326  bone]
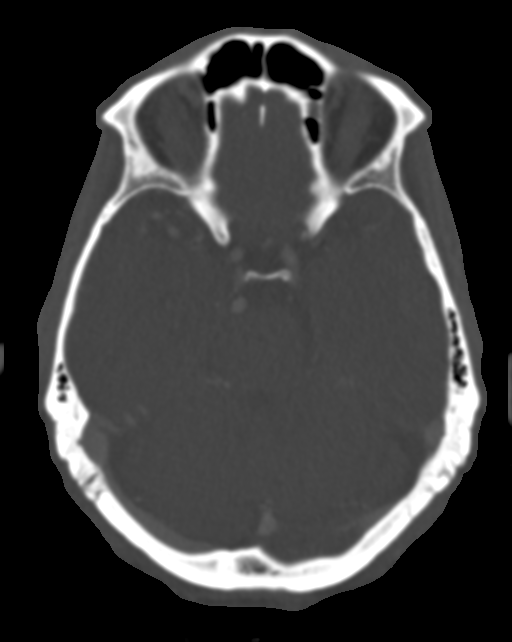
[im 271/326  soft-tissue]
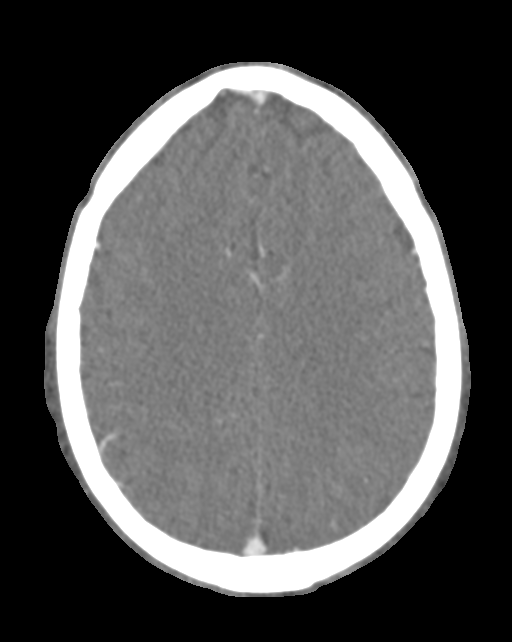

[5 of 33 positions shown; findings below may reference images not displayed]

FINDINGS: CT HEAD

Brain: Ventricle size is normal. Mild chronic ischemic changes in
the frontal lobes bilaterally. Negative for acute infarct. Negative
for hemorrhage or mass lesion.

Calvarium and skull base: Negative

Paranasal sinuses: Mucosal edema in the ethmoid and maxillary
sinuses bilaterally.

Orbits: Negative

CTA NECK

Aortic arch: Aortic arch not evaluated. This scan begins at the very
top of the arch. Proximal great vessels widely patent.

Right carotid system: Negative for right carotid stenosis or
dissection. Small area of atherosclerotic calcification right
lateral carotid bulb. Tortuosity of the cervical internal carotid
artery

Left carotid system: Negative for left carotid stenosis. Negative
for dissection or atherosclerotic disease. Tortuosity of the
cervical internal carotid artery

Vertebral arteries:Left vertebral dominant and widely patent the
basilar without stenosis or dissection. Right vertebral artery is
patent to right PICA without evidence of stenosis or dissection.

Skeleton: No focal bony abnormality.

Other neck: Negative for mass or adenopathy in the neck.

CTA HEAD

Anterior circulation: Cavernous carotid artery appears widely
patent. Evaluation is somewhat limited due to venous enhancement in
the cavernous sinus. Anterior and middle cerebral arteries are
widely patent without stenosis or aneurysm

Posterior circulation: Dominant left vertebral artery which supplies
the basilar. Right vertebral artery ends in PICA. Basilar is
tortuous and widely patent. Superior cerebellar and posterior
cerebral arteries are widely patent without significant stenosis. No
aneurysm in the posterior circulation.

Venous sinuses: Patent

Anatomic variants: None

Delayed phase: Normal enhancement.
IMPRESSION: Mild chronic microvascular ischemia in the frontal white matter
bilaterally. No acute intracranial abnormality.

Negative for carotid or vertebral artery stenosis or dissection. No
significant intracranial stenosis.

## 2017-06-02 ENCOUNTER — Ambulatory Visit (INDEPENDENT_AMBULATORY_CARE_PROVIDER_SITE_OTHER): Payer: Medicare Other | Admitting: Neurology

## 2017-06-02 DIAGNOSIS — G4761 Periodic limb movement disorder: Secondary | ICD-10-CM | POA: Diagnosis not present

## 2017-06-02 DIAGNOSIS — R0681 Apnea, not elsewhere classified: Secondary | ICD-10-CM

## 2017-06-02 DIAGNOSIS — F17200 Nicotine dependence, unspecified, uncomplicated: Secondary | ICD-10-CM

## 2017-06-02 DIAGNOSIS — R51 Headache: Secondary | ICD-10-CM

## 2017-06-02 DIAGNOSIS — Z8679 Personal history of other diseases of the circulatory system: Secondary | ICD-10-CM

## 2017-06-02 DIAGNOSIS — R0683 Snoring: Secondary | ICD-10-CM

## 2017-06-02 DIAGNOSIS — R400 Somnolence: Secondary | ICD-10-CM

## 2017-06-02 DIAGNOSIS — I63412 Cerebral infarction due to embolism of left middle cerebral artery: Secondary | ICD-10-CM

## 2017-06-02 DIAGNOSIS — G4720 Circadian rhythm sleep disorder, unspecified type: Secondary | ICD-10-CM

## 2017-06-02 DIAGNOSIS — R351 Nocturia: Secondary | ICD-10-CM

## 2017-06-02 DIAGNOSIS — R519 Headache, unspecified: Secondary | ICD-10-CM

## 2017-06-02 DIAGNOSIS — R569 Unspecified convulsions: Secondary | ICD-10-CM

## 2017-06-04 NOTE — Procedures (Signed)
PATIENT'S NAME:  [name], [name] DOB:      [birth year]      MR#:    [id number]     DATE OF RECORDING: 06/02/2017 REFERRING M.D.:  Cecille Rubin, NP Study Performed:   Baseline Polysomnogram HISTORY: 47 year old man with a history of stroke in 2017, history of seizure, heart disease, history of aortic dissection, anxiety, chronic kidney disease, reflux disease, congestive heart failure, and smoking, who reports snoring and excessive daytime somnolence. The patient endorsed the Epworth Sleepiness Scale at 11/24 points. The patient's weight 146 pounds with a height of 72 (inches), resulting in a BMI of 19.7 kg/m2. The patient's neck circumference measured 15 inches.  CURRENT MEDICATIONS: Norvasc, Lipitor, Normodyne, Keppra, Antivert, Prilosec, Senokot.   PROCEDURE:  This is a multichannel digital polysomnogram utilizing the Somnostar 11.2 system.  Electrodes and sensors were applied and monitored per AASM Specifications.   EEG, EOG, Chin and Limb EMG, were sampled at 200 Hz.  ECG, Snore and Nasal Pressure, Thermal Airflow, Respiratory Effort, CPAP Flow and Pressure, Oximetry was sampled at 50 Hz. Digital video and audio were recorded.      BASELINE STUDY  Lights Out was at 22:41 and Lights On at 04:49.  Total recording time (TRT) was 368.5 minutes, with a total sleep time (TST) of  304.5 minutes.   The patient's sleep latency was 27 minutes, which is delayed. REM latency was 164.5 minutes, which is delayed. The sleep efficiency was 82.6%.     SLEEP ARCHITECTURE: WASO (Wake after sleep onset) was 33.5 minutes with mild sleep fragmentation noted.  There were 13 minutes in Stage N1, 210.5 minutes Stage N2, 47.5 minutes Stage N3 and 33.5 minutes in Stage REM.  The percentage of Stage N1 was 4.3%, Stage N2 was 69.1%, which is increased, Stage N3 was 15.6% and Stage R (REM sleep) was 11.%, which is reduced. The arousals were noted as: 28 were spontaneous, 6 were associated with PLMs, 0 were associated with  respiratory events.  Audio and video analysis did not show any abnormal or unusual movements, behaviors, phonations or vocalizations. The patient took no bathroom breaks. Moderate snoring was noted. The EKG showed frequesnt PVCs.   RESPIRATORY ANALYSIS:  There were a total of 7 respiratory events:  6 obstructive apneas, 0 central apneas and 0 mixed apneas with a total of 6 apneas and an apnea index (AI) of 1.2 /hour. There were 1 hypopneas with a hypopnea index of .2 /hour. The patient also had 0 respiratory event related arousals (RERAs).      The total APNEA/HYPOPNEA INDEX (AHI) was 1.4/hour and the total RESPIRATORY DISTURBANCE INDEX was 1.4 /hour.  5 events occurred in REM sleep and 2 events in NREM. The REM AHI was 9. /hour, versus a non-REM AHI of .4. The patient spent 168.5 minutes of total sleep time in the supine position and 136 minutes in non-supine.. The supine AHI was 2.1 versus a non-supine AHI of 0.4.  OXYGEN SATURATION & C02:  The Wake baseline 02 saturation was 93%, with the lowest being 85%. Time spent below 89% saturation equaled 1 minutes.  PERIODIC LIMB MOVEMENTS: The patient had a total of 155 Periodic Limb Movements.  The Periodic Limb Movement (PLM) index was 30.5 and the PLM Arousal index was 1.2/hour.  Post-study, the patient indicated that sleep was better than usual.   IMPRESSION:  1. Periodic Limb Movement Disorder (PLMD) 2. Primary Snoring 3. Dysfunctions associated with sleep stages or arousal from sleep  RECOMMENDATIONS:  1.  This study does not demonstrate any significant obstructive or central sleep disordered breathing with the exception of mild REM related OSA and snoring. For disturbing snoring, an oral appliance (through a qualified dentist) can be considered. CPAP therapy is not warranted; sleeping off the back and smoking cessation is recommended.  2. Moderate PLMs (periodic limb movements of sleep) were noted during this study with no significant  arousals; clinical correlation is recommended.  3. This study shows sleep fragmentation and abnormal sleep stage percentages; these are nonspecific findings and per se do not signify an intrinsic sleep disorder or a cause for the patient's sleep-related symptoms. Causes include (but are not limited to) the first night effect of the sleep study, circadian rhythm disturbances, medication effect or an underlying mood disorder or medical problem.  4. The patient should be cautioned not to drive, work at heights, or operate dangerous or heavy equipment when tired or sleepy. Review and reiteration of good sleep hygiene measures should be pursued with any patient. 5. The patient can follow-up with is referring provider, who will be notified of the test results.  I certify that I have reviewed the entire raw data recording prior to the issuance of this report in accordance with the Standards of Accreditation of the American Academy of Sleep Medicine (AASM)   Star Age, MD, PhD Diplomat, American Board of Psychiatry and Neurology (Neurology and Sleep Medicine)

## 2017-06-04 NOTE — Progress Notes (Signed)
Patient referred by CM, seen by me on 04/28/17, diagnostic PSG on 06/02/17.   Please call and notify the patient that the recent sleep study did not show any significant obstructive sleep apnea, snoring was noted. He is advised to sleep off his back and quit smoking. For disturbing snoring, an oral appliance (through a qualified dentist) can be considered.   He had a cluster of leg movements in sleep, but not disruptive to sleep.  Please remind patient to try to maintain good sleep hygiene, which means: Keep a regular sleep and wake schedule and make enough time for sleep (7 1/2 to 8 1/2 hours for the average adult), try not to exercise or have a meal within 2 hours of your bedtime, try to keep your bedroom conducive for sleep, that is, cool and dark, without light distractors such as an illuminated alarm clock, and refrain from watching TV right before sleep or in the middle of the night and do not keep the TV or radio on during the night. If a nightlight is used, have it away from the visual field. Also, try not to use or play on electronic devices at bedtime, such as your cell phone, tablet PC or laptop. If you like to read at bedtime on an electronic device, try to dim the background light as much as possible. Do not eat in the middle of the night. Keep pets away from the bedroom environment. For stress relief, try meditation, deep breathing exercises (there are many books and CDs available), a white noise machine or fan can help to diffuse other noise distractors, such as traffic noise. Do not drink alcohol before bedtime, as it can disturb sleep and cause middle of the night awakenings. Never mix alcohol and sedating medications! Avoid narcotic pain medication close to bedtime, as opioids/narcotics can suppress breathing drive and breathing effort.   Please inform patient that he can FU with CM as planned.   Thanks,  Star Age, MD, PhD [facility])

## 2017-06-07 ENCOUNTER — Telehealth: Payer: Self-pay

## 2017-06-07 NOTE — Telephone Encounter (Signed)
I called pt. I advised pt that Dr. Rexene Alberts reviewed pt's sleep study and found that pt did not show any significant osa, some snoring was noted. Dr. Rexene Alberts recommends that pt avoid sleeping on his back and to try and quit smoking. I recommended that pt speak with his dentist regarding an oral appliance if snoring is disturbing to him.  I reviewed sleep hygiene recommendations with the pt, including trying to keep a regular sleep wake schedule, avoiding electronics in the bedroom, keeping the bedroom cool, dark, and quiet, and avoiding eating or exercising within 2 hours of bedtime as well as eating in the middle of the night. I advised pt to keep pets out of the bedroom. I discussed with pt the importance of stress relief and to try meditation, deep breathing exercises, and/or a white noise machine or fan to diffuse other noise distractors. I advised pt to not drink alcohol before bedtime and to never mix alcohol and sedating medications. Pt was advised to avoid narcotic pain medication close to bedtime. I advised pt that a copy of these sleep study results will be sent to [facility], NP. Pt verbalized understanding of results. Pt had no questions at this time but was encouraged to call back if questions arise.

## 2017-06-07 NOTE — Telephone Encounter (Signed)
-----   Message from Star Age, MD sent at 06/04/2017  1:44 PM EDT ----- Patient referred by CM, seen by me on 04/28/17, diagnostic PSG on 06/02/17.   Please call and notify the patient that the recent sleep study did not show any significant obstructive sleep apnea, snoring was noted. He is advised to sleep off his back and quit smoking. For disturbing snoring, an oral appliance (through a qualified dentist) can be considered.   He had a cluster of leg movements in sleep, but not disruptive to sleep.  Please remind patient to try to maintain good sleep hygiene, which means: Keep a regular sleep and wake schedule and make enough time for sleep (7 1/2 to 8 1/2 hours for the average adult), try not to exercise or have a meal within 2 hours of your bedtime, try to keep your bedroom conducive for sleep, that is, cool and dark, without light distractors such as an illuminated alarm clock, and refrain from watching TV right before sleep or in the middle of the night and do not keep the TV or radio on during the night. If a nightlight is used, have it away from the visual field. Also, try not to use or play on electronic devices at bedtime, such as your cell phone, tablet PC or laptop. If you like to read at bedtime on an electronic device, try to dim the background light as much as possible. Do not eat in the middle of the night. Keep pets away from the bedroom environment. For stress relief, try meditation, deep breathing exercises (there are many books and CDs available), a white noise machine or fan can help to diffuse other noise distractors, such as traffic noise. Do not drink alcohol before bedtime, as it can disturb sleep and cause middle of the night awakenings. Never mix alcohol and sedating medications! Avoid narcotic pain medication close to bedtime, as opioids/narcotics can suppress breathing drive and breathing effort.   Please inform patient that he can FU with CM as planned.   Thanks,  Star Age,  MD, PhD [facility])

## 2017-06-17 ENCOUNTER — Other Ambulatory Visit: Payer: Self-pay | Admitting: Internal Medicine

## 2017-06-17 MED ORDER — CLONIDINE 0.3 MG/24HR TD PTWK: 0.3000 mg | MEDICATED_PATCH | TRANSDERMAL | 0 refills | Status: DC

## 2017-06-21 ENCOUNTER — Ambulatory Visit (INDEPENDENT_AMBULATORY_CARE_PROVIDER_SITE_OTHER): Payer: Medicare Other | Admitting: *Deleted

## 2017-06-21 DIAGNOSIS — I639 Cerebral infarction, unspecified: Secondary | ICD-10-CM | POA: Diagnosis not present

## 2017-06-21 DIAGNOSIS — Z0271 Encounter for disability determination: Secondary | ICD-10-CM

## 2017-06-22 ENCOUNTER — Encounter (INDEPENDENT_AMBULATORY_CARE_PROVIDER_SITE_OTHER): Payer: Self-pay | Admitting: Physician Assistant

## 2017-06-22 ENCOUNTER — Ambulatory Visit (INDEPENDENT_AMBULATORY_CARE_PROVIDER_SITE_OTHER): Payer: Medicare Other | Admitting: Physician Assistant

## 2017-06-22 ENCOUNTER — Other Ambulatory Visit: Payer: Self-pay

## 2017-06-22 VITALS — BP 154/84 | HR 64 | Temp 98.0°F | Ht 72.0 in | Wt 150.8 lb

## 2017-06-22 DIAGNOSIS — F418 Other specified anxiety disorders: Secondary | ICD-10-CM

## 2017-06-22 DIAGNOSIS — I10 Essential (primary) hypertension: Secondary | ICD-10-CM

## 2017-06-22 DIAGNOSIS — M792 Neuralgia and neuritis, unspecified: Secondary | ICD-10-CM | POA: Diagnosis not present

## 2017-06-22 DIAGNOSIS — R5383 Other fatigue: Secondary | ICD-10-CM | POA: Diagnosis not present

## 2017-06-22 DIAGNOSIS — I639 Cerebral infarction, unspecified: Secondary | ICD-10-CM | POA: Diagnosis not present

## 2017-06-22 MED ORDER — AMITRIPTYLINE HCL 25 MG PO TABS: 25.0000 mg | ORAL_TABLET | Freq: Every day | ORAL | 2 refills | Status: DC

## 2017-06-22 MED ORDER — LABETALOL HCL 300 MG PO TABS: 300.0000 mg | ORAL_TABLET | Freq: Every day | ORAL | 5 refills | Status: DC

## 2017-06-22 MED ORDER — GABAPENTIN 300 MG PO CAPS: 600.0000 mg | ORAL_CAPSULE | Freq: Three times a day (TID) | ORAL | 2 refills | Status: DC

## 2017-06-22 NOTE — Progress Notes (Signed)
Pt complains of sharp pain that radiate through the top of his feet through his toes

## 2017-06-22 NOTE — Progress Notes (Signed)
[facility] Summary Report / Loop Recorder 

## 2017-06-22 NOTE — Progress Notes (Signed)
Subjective:  Patient ID: [name], male    DOB: [birth year]  Age: 47 y.o. MRN: [id number]  CC: pain since surgery  HPI [name] is a 47 y.o. 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 Complains of left flank pain that is described as sharp, stinging, and burning along the large incisional scar from surgery. Aggravated with movements of the torso. 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 Does not endorse suicidal ideation or intent. Remains hopeful he will heal and recuperate his losses. BP noted to be elevated today. Goes to cardiology for regular follow ups. Does not endorse any cardiovascular or constitutional symptoms.     Outpatient Medications Prior to Visit  Medication Sig Dispense Refill  . amLODipine (NORVASC) 10 MG tablet Take 10 mg by mouth daily.     [name] aspirin 81 MG chewable tablet Chew 81 tablets daily by mouth.    [name] atorvastatin (LIPITOR) 20 MG tablet Take 1 tablet (20 mg total) by mouth daily. 30 tablet 0  . cloNIDine (CATAPRES - DOSED IN MG/24 HR) 0.3 mg/24hr patch Place 1 patch (0.3 mg total) onto the skin once a week. Please make overdue yearly appt with Dr. Caryl Comes before anymore refills. 1st attempt 4 patch 0  . labetalol (NORMODYNE) 300 MG tablet Take 300 mg by mouth daily.   0  . lidocaine (XYLOCAINE) 2 % solution Use as directed 5 mLs in the mouth or throat daily as needed. Mouth pain  0  . oxyCODONE-acetaminophen (PERCOCET) 10-325 MG tablet Take 1 tablet by mouth every 6 (six) hours as needed for pain.  0  . lisinopril (PRINIVIL,ZESTRIL) 10 MG tablet Take 10 mg  by mouth daily.    [name] ALPRAZolam (XANAX) 1 MG tablet Take 1 mg by mouth daily as needed for anxiety.  0  . amoxicillin (AMOXIL) 500 MG capsule Take 1 capsule (500 mg total) by mouth 3 (three) times daily. 21 capsule 0  . levETIRAcetam (KEPPRA) 500 MG tablet Take 1 tablet (500 mg total) by mouth 2 (two) times daily. 60 tablet 0  . meclizine (ANTIVERT) 25 MG tablet 25mg  by mouth three times daily as needed for dizziness    . omeprazole (PRILOSEC) 20 MG capsule Take 20 mg daily by mouth.     No facility-administered medications prior to visit.      ROS Review of Systems  Constitutional: Negative for chills, fever and malaise/fatigue.  Eyes: Negative for blurred vision.  Respiratory: Negative for shortness of breath.   Cardiovascular: Negative for chest pain and palpitations.  Gastrointestinal: Negative for abdominal pain and nausea.  Genitourinary: Negative for dysuria and hematuria.  Musculoskeletal: Negative for joint pain and myalgias.  Skin: Negative for rash.  Neurological: Negative for tingling and headaches.       Stinging pain in the left flank   Psychiatric/Behavioral: Negative for depression. The patient is not nervous/anxious.     Objective:  BP (!) 154/84 (BP Location: Left Arm, Patient Position: Sitting, Cuff Size: Normal)   Pulse 64   Temp 98 F (36.7 C) (Oral)   Ht 6' (1.829 m)  Wt 150 lb 12.8 oz (68.4 kg)   SpO2 99%   BMI 20.45 kg/m   BP/Weight 06/22/2017 05/19/2017 05/09/8248  Systolic BP 037 048 889  Diastolic BP 84 91 85  Wt. (Lbs) 150.8 146 146  BMI 20.45 19.8 19.8      Physical Exam  Constitutional: He is oriented to person, place, and time. He appears well-developed and well-nourished.  HENT:  Head: Normocephalic and atraumatic.  Eyes: Conjunctivae are normal. No scleral icterus.  Neck: No thyromegaly present.  Cardiovascular: Normal rate and regular rhythm.  Murmur (4/6 machine like murmur with loud snap) heard. Pulmonary/Chest: Effort normal and  breath sounds normal.  Abdominal: Soft. Bowel sounds are normal.  Musculoskeletal: He exhibits no edema.  Lymphadenopathy:    He has no cervical adenopathy.  Neurological: He is alert and oriented to person, place, and time. No cranial nerve deficit.  Skin: Skin is warm and dry. No rash noted. No erythema. No pallor.  TTP along the incisional scar on left abdomen/flank  Psychiatric: He has a normal mood and affect. His behavior is normal. Thought content normal.     Assessment & Plan:    1. Neuralgia of flank, left - Begin gabapentin (NEURONTIN) 300 MG capsule; Take 2 capsules (600 mg total) by mouth 3 (three) times daily.  Dispense: 180 capsule; Refill: 2 - Begin amitriptyline (ELAVIL) 25 MG tablet; Take 1 tablet (25 mg total) by mouth at bedtime.  Dispense: 30 tablet; Refill: 2  2. Depression with anxiety - Begin amitriptyline (ELAVIL) 25 MG tablet; Take 1 tablet (25 mg total) by mouth at bedtime.  Dispense: 30 tablet; Refill: 2  3. Fatigue, unspecified type - CBC with Differential - Comprehensive metabolic panel - TSH  4. Hypertension, unspecified type - Uncontrolled today. Cardiology recently increased Labetalol to 300 mg qday and referred to Nephrology for further BP control. No nephrology notes to review. Patient states he has not picked up lisinopril from pharmacy yet.    Meds ordered this encounter  Medications  . gabapentin (NEURONTIN) 300 MG capsule    Sig: Take 2 capsules (600 mg total) by mouth 3 (three) times daily.    Dispense:  180 capsule    Refill:  2    Order Specific Question:   Supervising Provider    Answer:   Tresa Garter [id number]  . amitriptyline (ELAVIL) 25 MG tablet    Sig: Take 1 tablet (25 mg total) by mouth at bedtime.    Dispense:  30 tablet    Refill:  2    Order Specific Question:   Supervising Provider    Answer:   Tresa Garter [id number]    Follow-up: Return in about 1 month (around 07/20/2017) for Depression and neuralgia.    Clent Demark PA

## 2017-06-23 ENCOUNTER — Telehealth: Payer: Self-pay | Admitting: Cardiology

## 2017-06-23 LAB — COMPREHENSIVE METABOLIC PANEL
A/G RATIO: 1.3 (ref 1.2–2.2)
ALK PHOS: 109 IU/L (ref 39–117)
ALT: 13 IU/L (ref 0–44)
AST: 15 IU/L (ref 0–40)
Albumin: 4.1 g/dL (ref 3.5–5.5)
BILIRUBIN TOTAL: 0.2 mg/dL (ref 0.0–1.2)
BUN/Creatinine Ratio: 12 (ref 9–20)
BUN: 20 mg/dL (ref 6–24)
CHLORIDE: 105 mmol/L (ref 96–106)
CO2: 21 mmol/L (ref 20–29)
Calcium: 9.2 mg/dL (ref 8.7–10.2)
Creatinine, Ser: 1.7 mg/dL — ABNORMAL HIGH (ref 0.76–1.27)
GFR calc Af Amer: 55 mL/min/{1.73_m2} — ABNORMAL LOW (ref >59)
GFR calc non Af Amer: 48 mL/min/{1.73_m2} — ABNORMAL LOW (ref >59)
Globulin, Total: 3.1 g/dL (ref 1.5–4.5)
Glucose: 80 mg/dL (ref 65–99)
POTASSIUM: 4.5 mmol/L (ref 3.5–5.2)
Sodium: 141 mmol/L (ref 134–144)
Total Protein: 7.2 g/dL (ref 6.0–8.5)

## 2017-06-23 LAB — CBC WITH DIFFERENTIAL/PLATELET
BASOS: 1 %
Basophils Absolute: 0 10*3/uL (ref 0.0–0.2)
EOS (ABSOLUTE): 0.1 10*3/uL (ref 0.0–0.4)
Eos: 4 %
HEMATOCRIT: 38.1 % (ref 37.5–51.0)
Hemoglobin: 12.2 g/dL — ABNORMAL LOW (ref 13.0–17.7)
IMMATURE GRANULOCYTES: 0 %
Immature Grans (Abs): 0 10*3/uL (ref 0.0–0.1)
LYMPHS ABS: 1.4 10*3/uL (ref 0.7–3.1)
Lymphs: 34 %
MCH: 29.8 pg (ref 26.6–33.0)
MCHC: 32 g/dL (ref 31.5–35.7)
MCV: 93 fL (ref 79–97)
MONOS ABS: 0.5 10*3/uL (ref 0.1–0.9)
Monocytes: 11 %
NEUTROS ABS: 2 10*3/uL (ref 1.4–7.0)
NEUTROS PCT: 50 %
PLATELETS: 156 10*3/uL (ref 150–379)
RBC: 4.1 x10E6/uL — ABNORMAL LOW (ref 4.14–5.80)
RDW: 14.9 % (ref 12.3–15.4)
WBC: 4 10*3/uL (ref 3.4–10.8)

## 2017-06-23 LAB — TSH: TSH: 1.49 u[IU]/mL (ref 0.450–4.500)

## 2017-06-23 NOTE — Telephone Encounter (Signed)
Spoke w/ pt and requested that he send a manual transmission b/c his home monitor has not updated in at least 14 days.   

## 2017-06-24 ENCOUNTER — Telehealth (INDEPENDENT_AMBULATORY_CARE_PROVIDER_SITE_OTHER): Payer: Self-pay

## 2017-06-24 NOTE — Telephone Encounter (Signed)
Left voicemail for patient detailing that results show mild but stable anemia likely due to impaired kidney function. Nat Christen, CMA

## 2017-06-24 NOTE — Telephone Encounter (Signed)
-----   Message from Clent Demark, PA-C sent at 06/23/2017  1:48 PM EDT ----- Mild but stable anemia likely due to impaired kidney function.

## 2017-06-29 LAB — CUP PACEART REMOTE DEVICE CHECK
Date Time Interrogation Session: 20190306003551
MDC IDC PG IMPLANT DT: 20180406

## 2017-06-29 IMAGING — DX DG CHEST 2V
3 series · 3 of 3 positions shown · non-contrast
Comparison: CT chest abdomen pelvis 09/02/2014; chest radiograph
09/02/2014.

CLINICAL DATA: Patient status post aortic stent placement 1 month
prior. Centralized chest pain.

EXAM:
CHEST  2 VIEW

[chest lat]
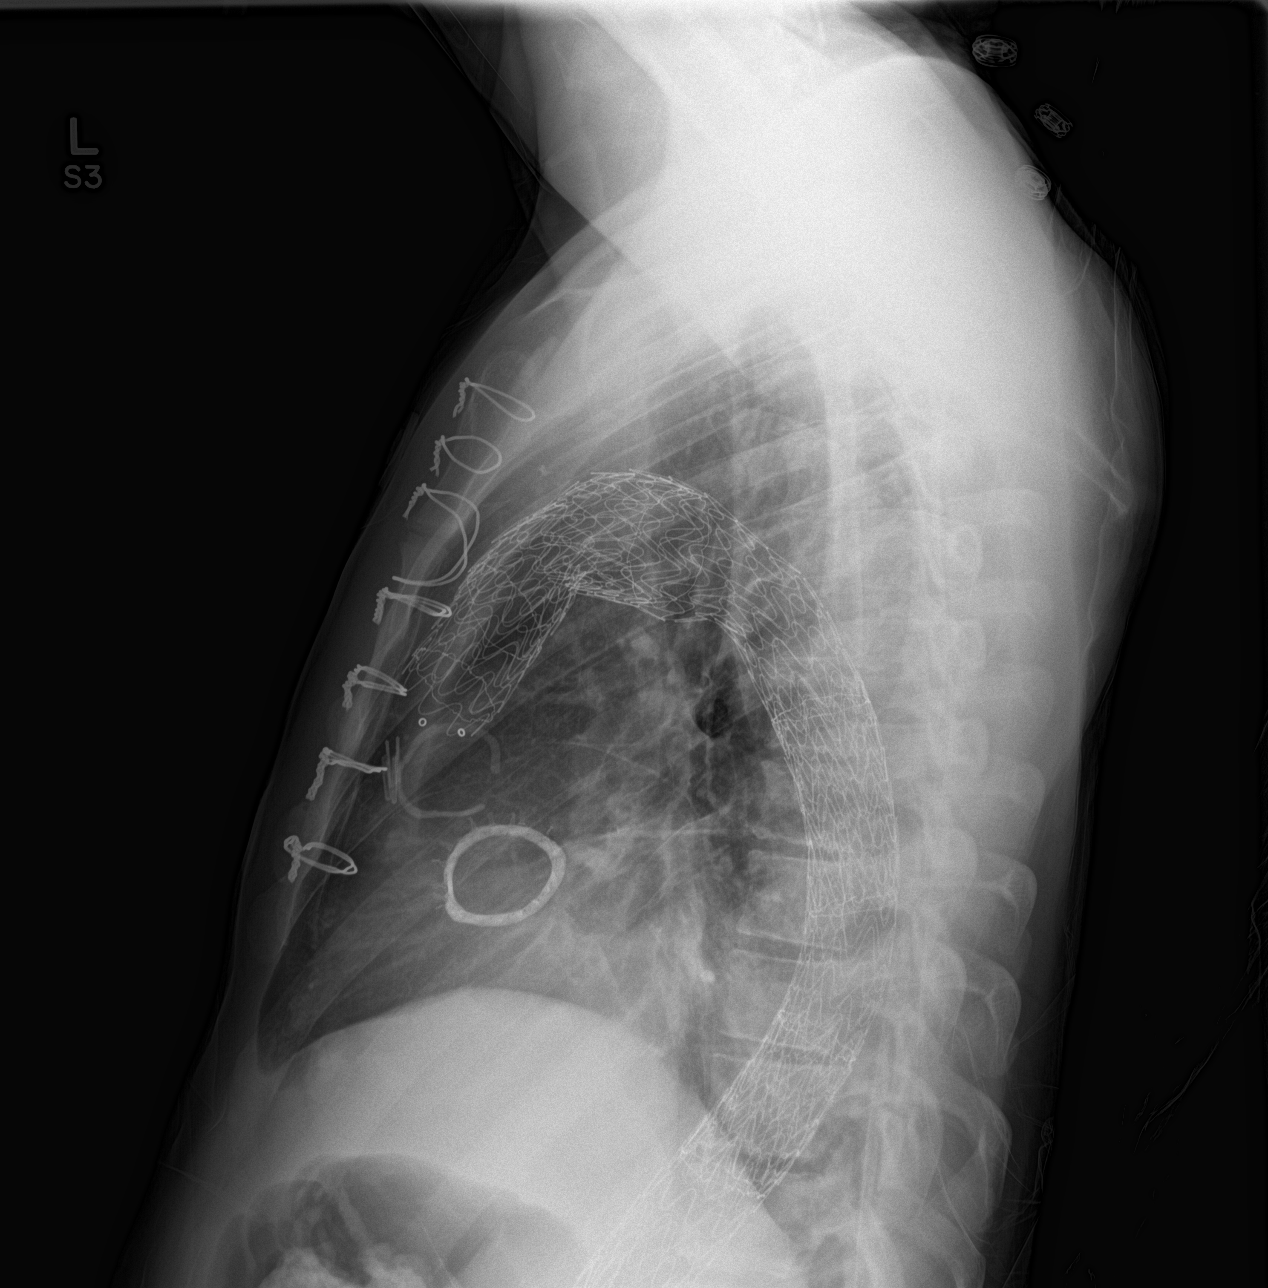

[chest ap (1 of 2)]
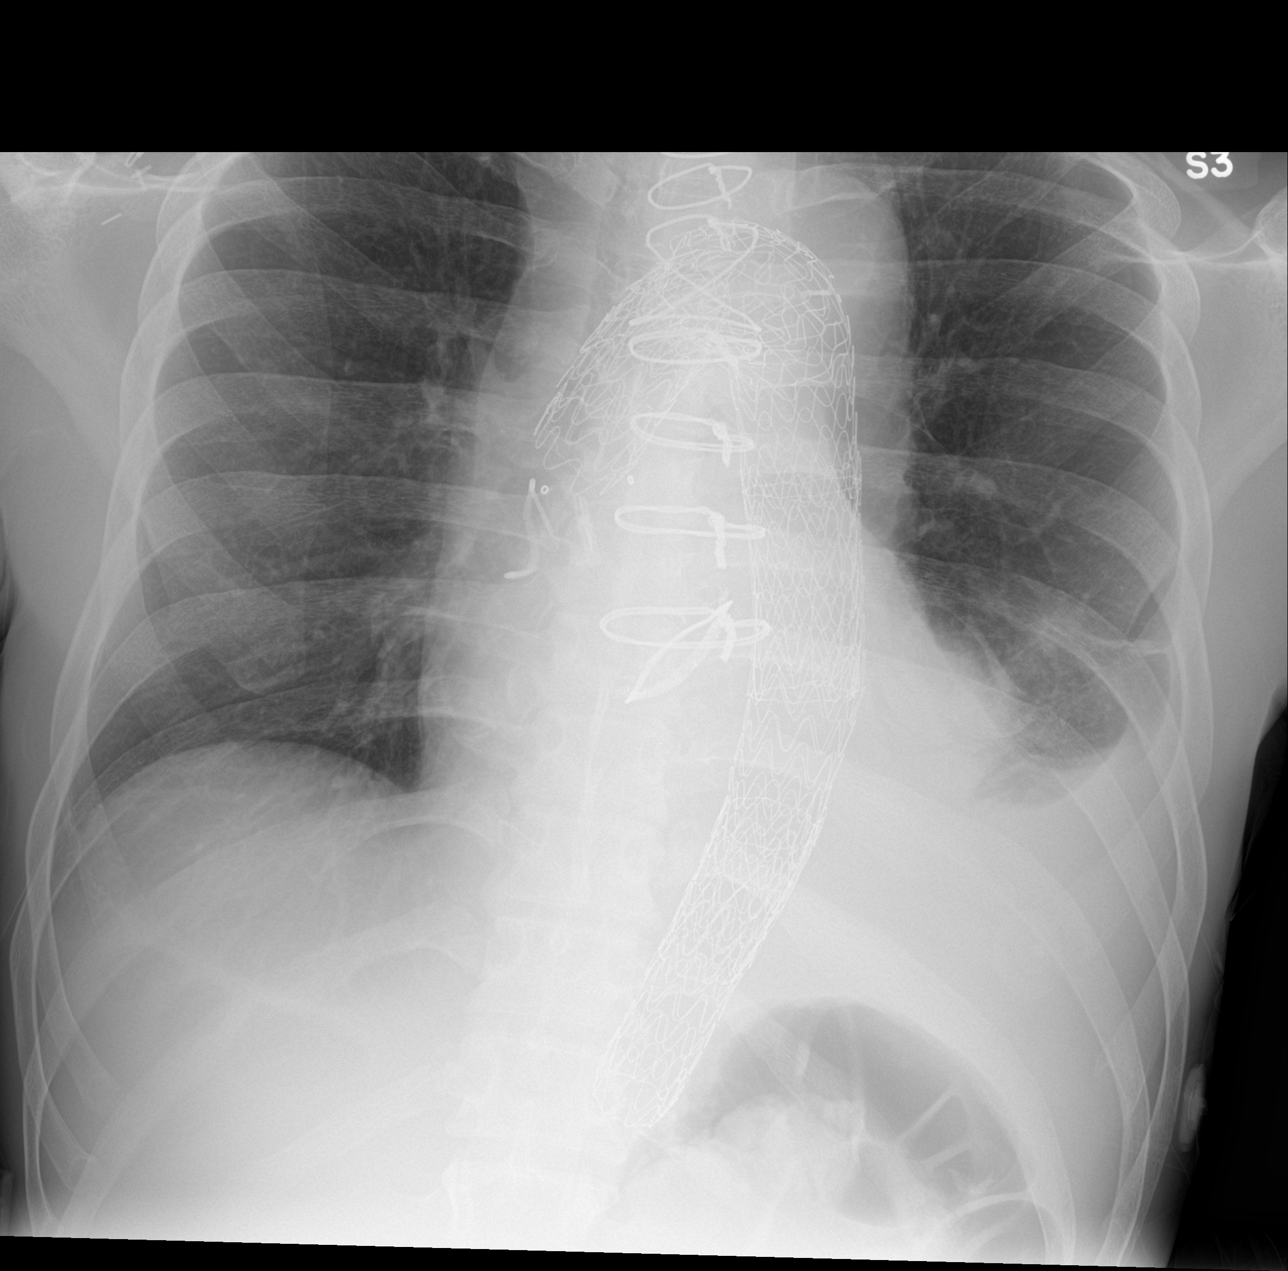

[chest ap (2 of 2)]
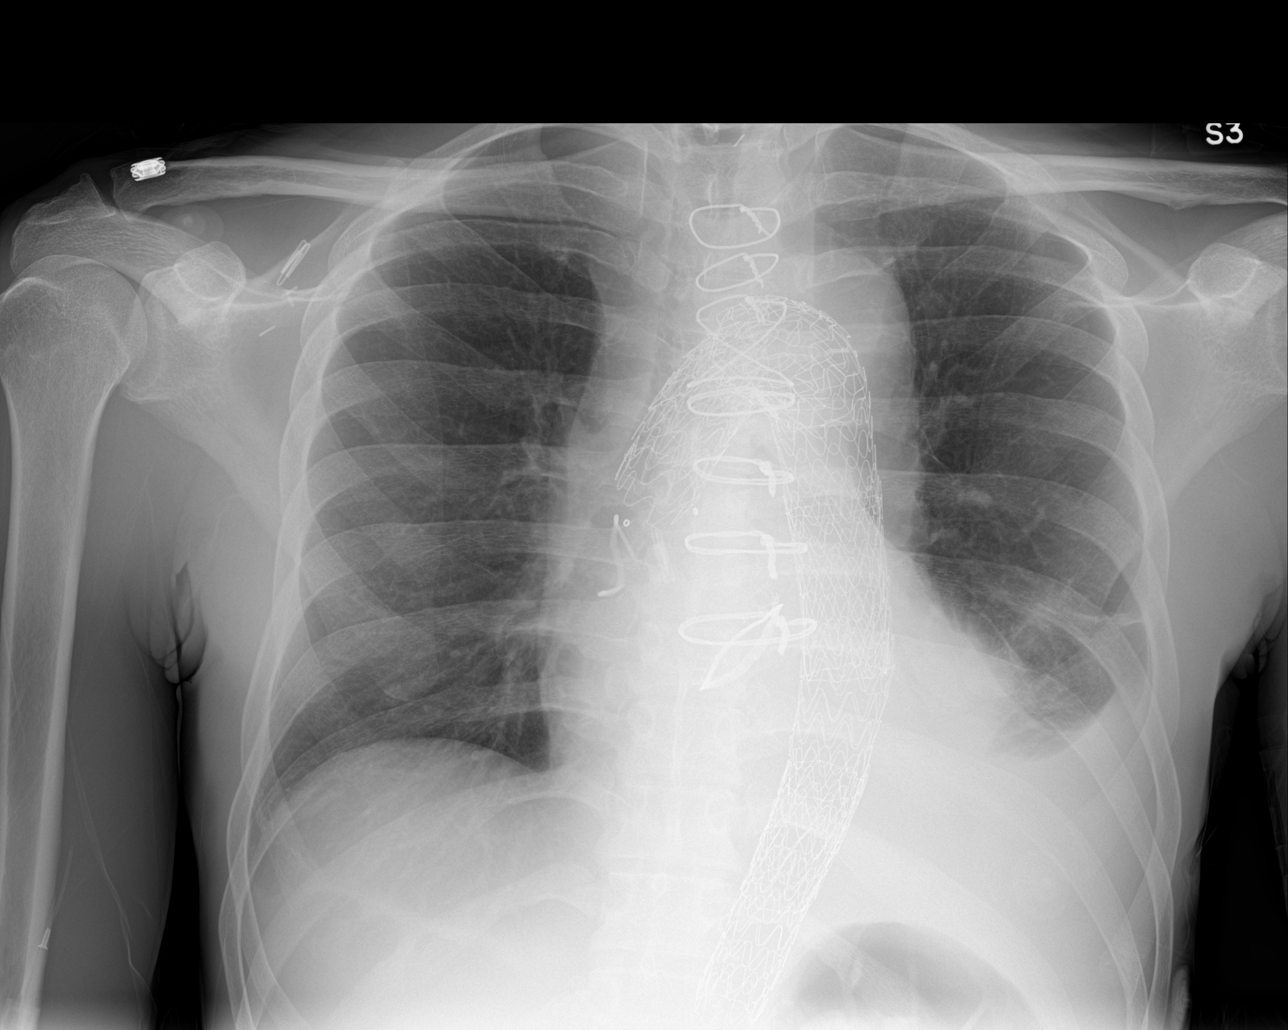

[3 of 3 positions shown; findings below may reference images not displayed]

FINDINGS: Re- demonstrated enlarged cardiac contours. Patient status post
aortic stent graft placement. Patient status post median sternotomy.
There is a small left pleural effusion with underlying pulmonary
consolidation. No definite pneumothorax. Regional skeleton is
unremarkable.
IMPRESSION: Patient status post interval placement of aortic stent graft.

Small left pleural effusion with underlying pulmonary consolidation.
Considerations include atelectasis or infection.

## 2017-07-01 ENCOUNTER — Encounter: Payer: Self-pay | Admitting: Cardiology

## 2017-07-06 IMAGING — CT CT ANGIO CHEST
1 of 9 series · 1 of 36 positions shown · IV contrast (Iohexol (Omnipaque 350))
Comparison: 09/02/2014

CLINICAL DATA: Aortic dissection repair

EXAM:
CT ANGIOGRAPHY CHEST, ABDOMEN AND PELVIS
TECHNIQUE: Multidetector CT imaging through the chest, abdomen and pelvis was
performed using the standard protocol during bolus administration of
intravenous contrast. Multiplanar reconstructed images and MIPs were
obtained and reviewed to evaluate the vascular anatomy.
CONTRAST:  100mL OMNIPAQUE IOHEXOL 350 MG/ML SOLN

[Series 300: locator · axial · 0.68mm/px · 1 of 1 slices shown]
[im 1/1  lung]
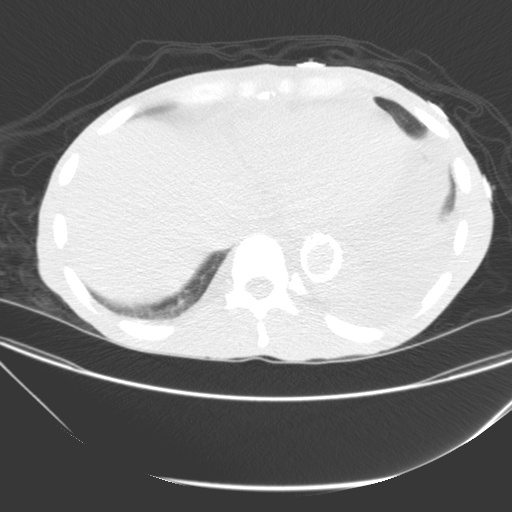

[1 of 36 positions shown; findings below may reference images not displayed]

FINDINGS: CTA CHEST FINDINGS

A stent graft and aortic root replacement have been placed in the
thoracic aorta for repair. An artificial valve is now in place.
Above the valve, the lumen of the aorta is dilated measuring 4.5 cm.
This is most likely graft or native aorta above the valve. There is
no operative note available for correlation. Just above the valve,
it is probably graft material.

There is an anastomosis to a short segment of native aorta in the
ascending aorta. At this level, there is a common origin for the 3
great vessels. This anastomosis is widely patent. The innominate
artery, right subclavian artery, right common carotid artery, left
common carotid artery, and left subclavian artery are all widely
patent via this anastomosis and bypass. They great vessels are
positioned anterior to the left innominate vein which is severely
narrowed.

Beyond this anastomosis to the great vessels, there is the proximal
extent of the stent graft which extends throughout the thoracic
aorta to the level of the diaphragmatic hiatus. It is positioned
within the true lumen of the aorta. The false lumen is still present
and partially opacifies. And intercostal artery is seen opacified
with contrast and feeding the false lumen. This is best appreciated
on images 90, 91, and 92 of series 501. Other intercostal vessels
may also be contributing to this opacification. This is behaving
similar to a type 2 endoleak. Maximal AP and transverse diameter of
the mid descending thoracic aorta are 5.2 and 4.3 cm. Previously,
these measurements were 4.9 and 4.5 cm. There is no contrast
extravasation outside the true or false lumen of the aorta

There is fluid density throughout the mediastinum compatible with
postoperative changes. Sternotomy repair is noted. Moderate size
left pleural effusion and volume loss in the left lung are present.
The heart is noted to be enlarged with left ventricular myocardial
hypertrophy.

No obvious evidence of pulmonary thromboembolism.

Minimal dependent atelectasis in the right lung. No lung mass. No
evidence of interstitial edema. Airway is patent. Tiny right pleural
effusion.

Previously describe soft tissue mass anterior to the aortic arch is
obscured by fluid density.

Review of the MIP images confirms the above findings.

CTA ABDOMEN AND PELVIS FINDINGS

The aortic dissection within the abdominal aorta is again noted.
Previously, the true lumen was very narrow. After stent graft
repair, the true lumen has markedly improved in caliber. The false
lumen continues to opacify and hands in the distal aorta. There is a
fenestration distally. This can be seen on image 178 of series 501
and image 88 of series 503.

Celiac takeoff occurs from the false lumen. It is patent. Branch
vessels are patent and non aneurysmal.

SMA takeoff occur is from the true lumen. It is widely patent. There
is no evidence of extension of dissection into the SMA.

IMA is severely diminutive.  Branch vessels are grossly patent.

Right renal artery is patent from the true lumen. Left renal artery
takeoff occurs from the aorta at the level of the fenestration.
There is severe narrowing at the origin of the left renal artery.
This is best appreciated on sagittal reconstruction images. See
images 90-94 of series 503.

Maximal AP and transverse diameters of the aorta are 3.6 and 5.3 cm.
Based on my direct measurements of the prior study, this has
increased from 2.9 and 4.3 cm.

Right and left common iliac arteries are patent and stable in
appearance with mild ectasia left greater than right. Maximal left
common iliac artery diameter is 1.9 cm. That of the right is 1.5 cm.
Scattered atherosclerotic changes are present.

Left common femoral artery is ectatic measuring 12 mm in caliber.
There is a 4.4 x 5.0 cm. Fluid collection in the right inguinal
region which is new and likely postoperative. There is no evidence
of pseudoaneurysm.

Liver, spleen, pancreas, adrenal glands, and kidneys are within
normal limits. Small amount of fluid adjacent to the gallbladder and
liver is likely related to edema. There is minimal scattered
free-fluid in the abdomen and pelvis.

No vertebral compression deformity.

Review of the MIP images confirms the above findings.
IMPRESSION: The patient is now status post aortic valve replacement, aortic
stent graft repair, and great vessel re- attachment to the ascending
aorta. The great vessels are patent. The artificial aortic valve is
in place, and the stent graft is in place with improvement in the
true lumen. There continues to be opacification of the false lumen
due to fenestration in the abdomen as well as and inter costal
feeding vessel within the thorax. AP diameter of the thoracic aorta
has increased from 4.9 cm to 5.2 cm.

Moderate left pleural effusion has developed with volume loss in the
lungs.

In the abdomen, the true lumen is improved with a larger diameter.
The false lumen continues to opacified.

There continues to be narrowing at the origin of the left renal
artery as described above.

Maximal transverse diameter of the abdominal aorta has increased
from 4.3 cm to 5.3 cm.

There is a 5.0 cm fluid collection in the right groin that is likely
postoperative. No evidence of pseudoaneurysm in the right groin.

## 2017-07-06 IMAGING — CR DG CHEST 2V
2 series · 2 of 2 positions shown · non-contrast
Comparison: November 01, 2014.

CLINICAL DATA: Chest pain.

EXAM:
CHEST  2 VIEW

[chest pa]
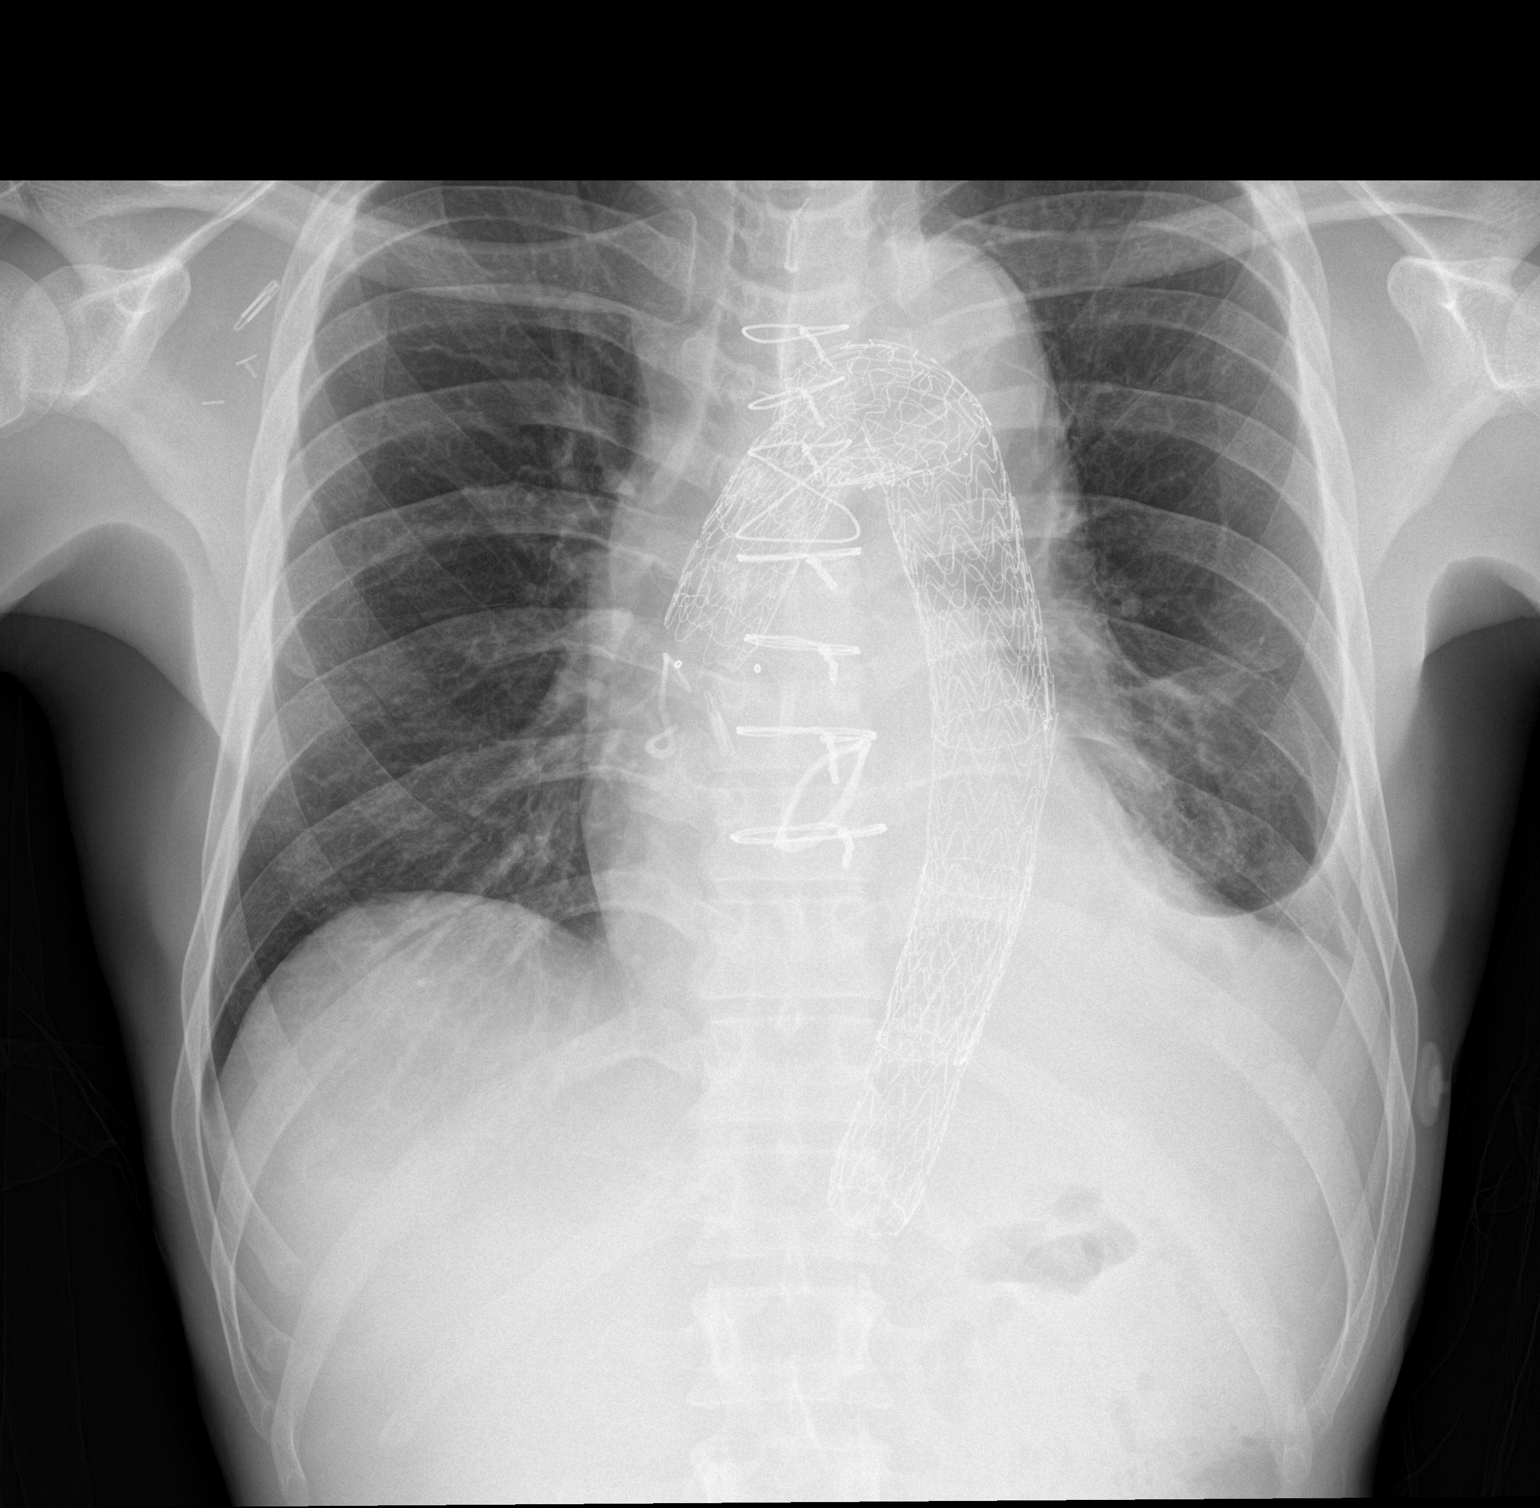

[chest lat]
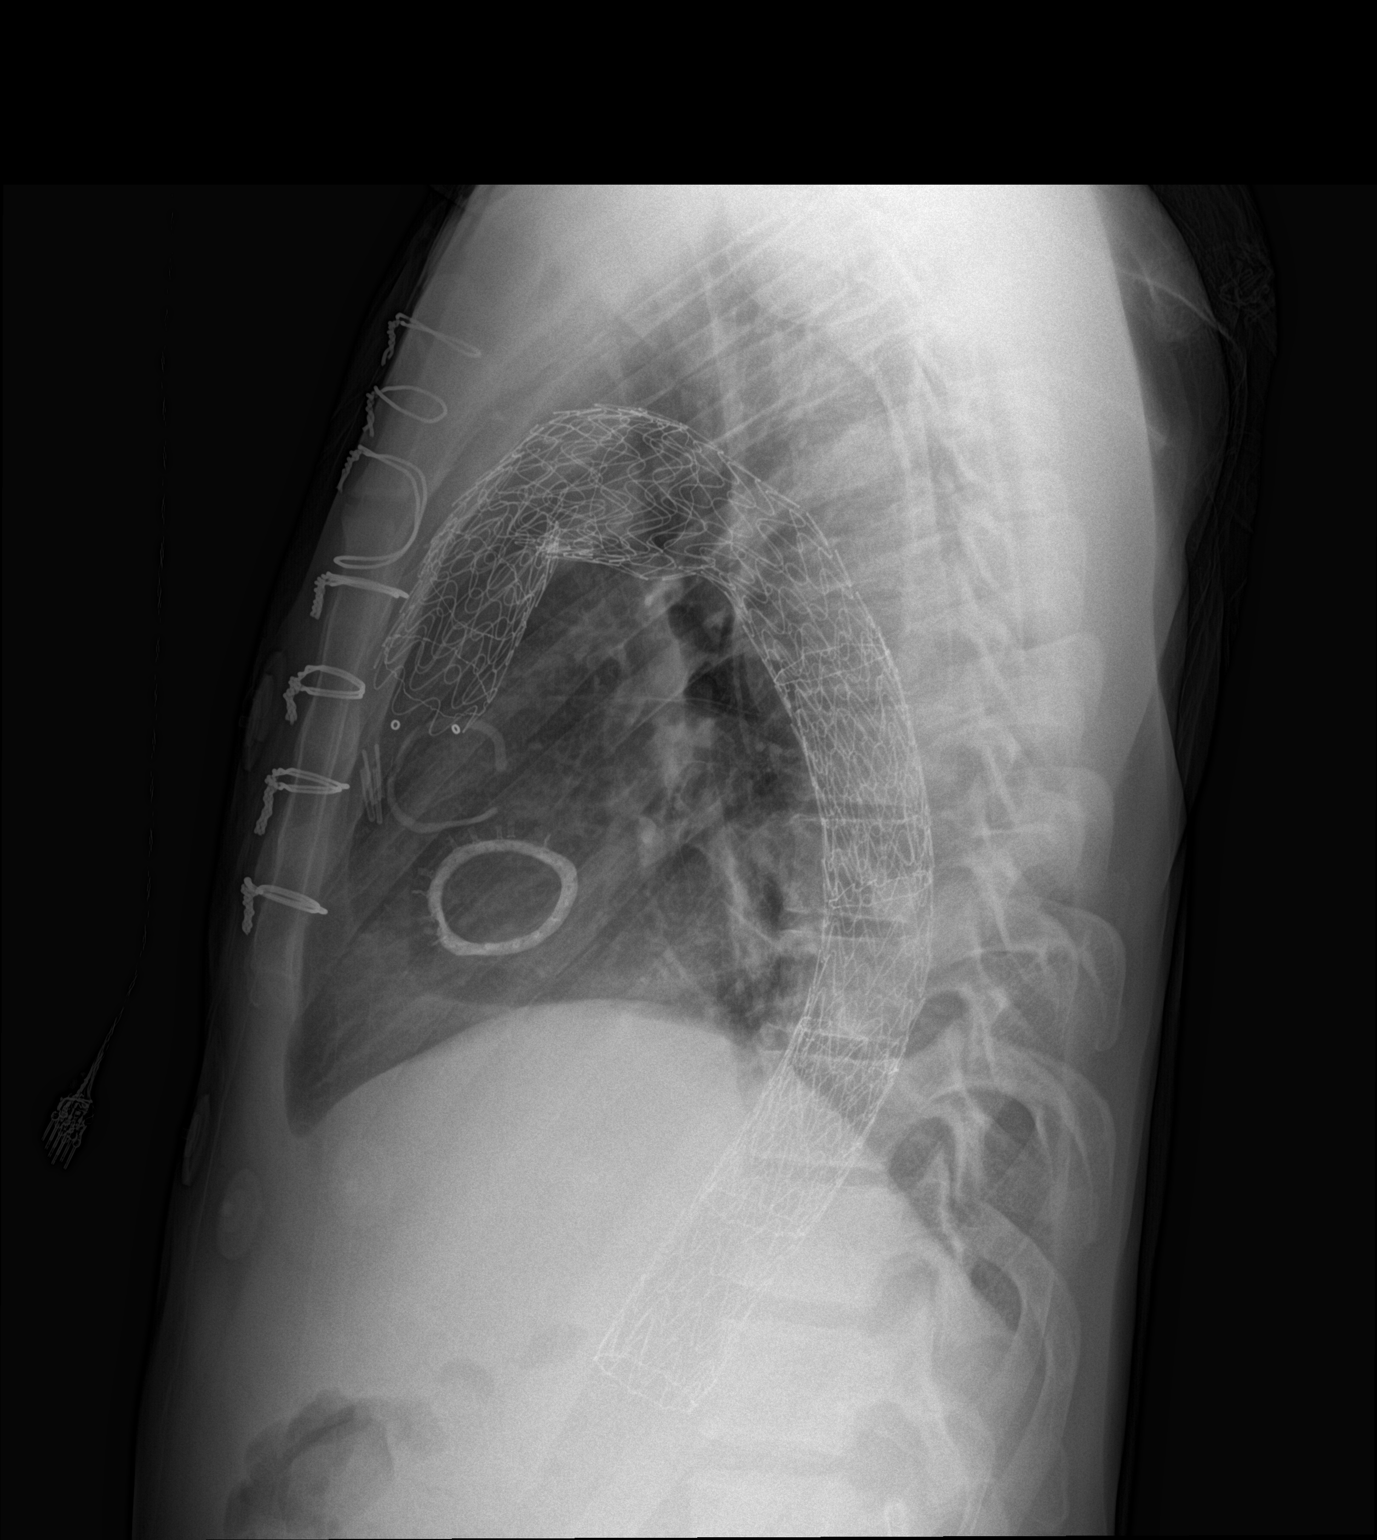

[2 of 2 positions shown; findings below may reference images not displayed]

FINDINGS: Stable cardiomegaly. Status post stent graft repair of thoracic
aortic aneurysm which is unchanged compared to prior exam. No
pneumothorax is noted. Stable effusion or scarring is noted in the
left lung base. Status post aortic valve repair is well. Right lung
is clear. Bony thorax is intact.
IMPRESSION: Postsurgical changes as described above. Stable mild left pleural
effusion or left basilar scarring is noted compared to prior exam.
No significant changes noted compared to prior exam.

## 2017-07-13 IMAGING — CR DG CHEST 2V
2 series · 2 of 2 positions shown · non-contrast
Comparison: 11/08/2014

CLINICAL DATA: Chest pain, fever, cough. History of thoracic aortic
stent graft.

EXAM:
CHEST  2 VIEW

[chest pa]
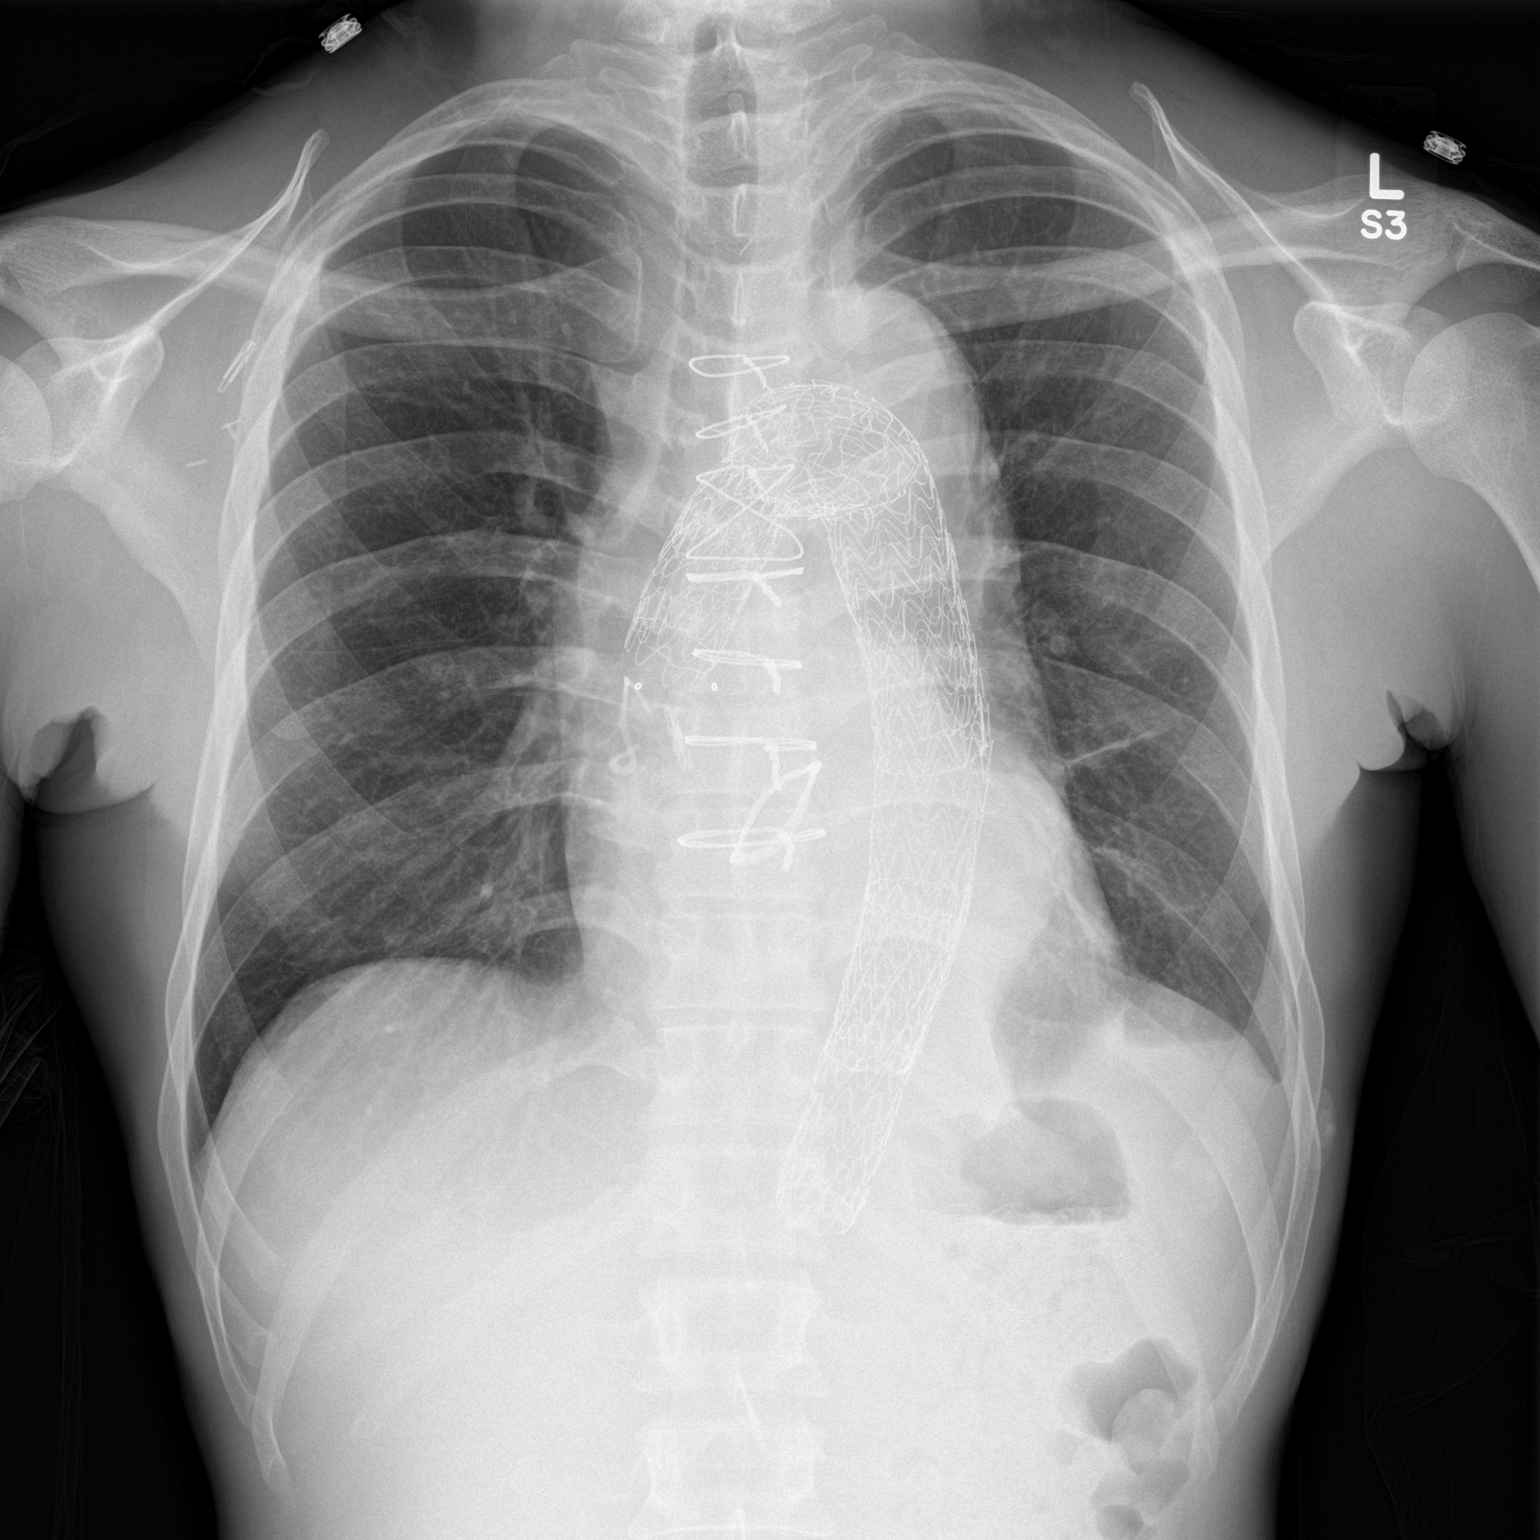

[chest lat]
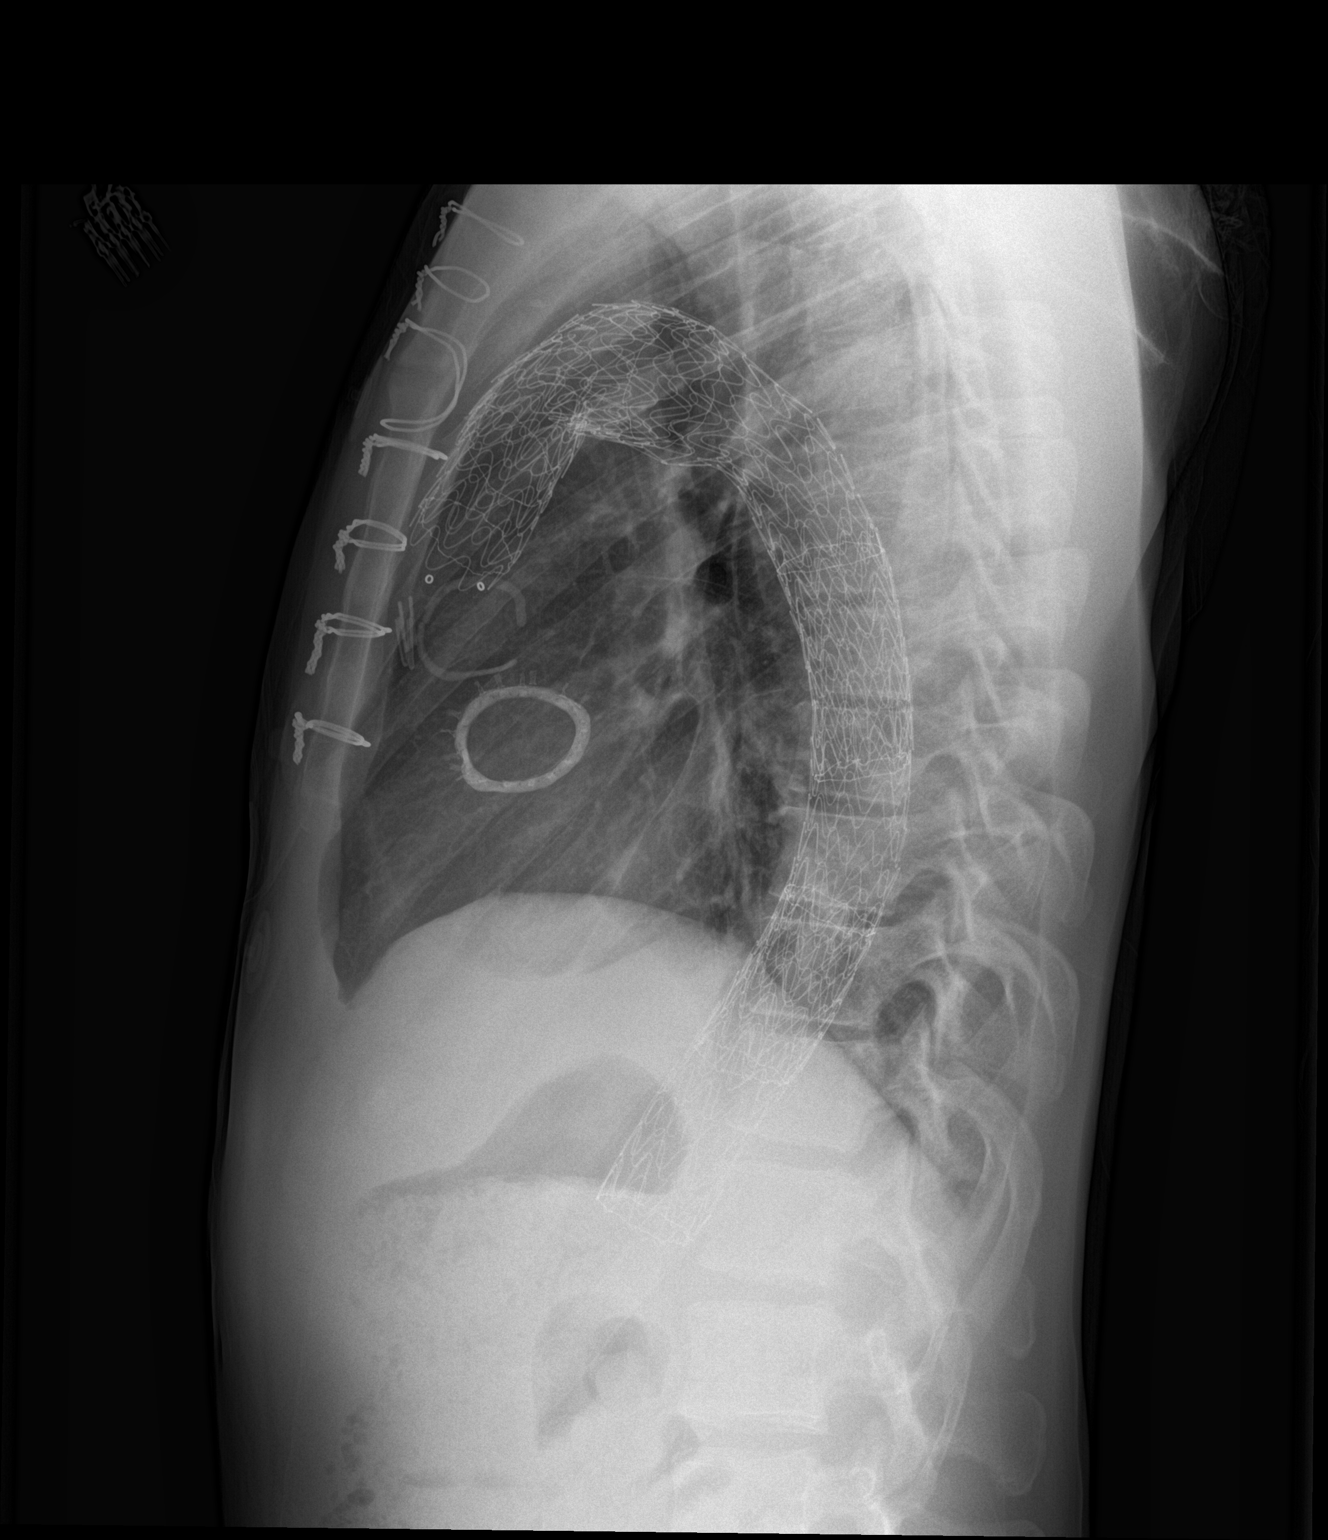

[2 of 2 positions shown; findings below may reference images not displayed]

FINDINGS: The aortic stent graft appears stable in position. The heart is
normal in size. The pulmonary hila appear normal. The left pleural
effusion has resolved. No edema or infiltrates.
IMPRESSION: No acute cardiopulmonary findings.

Stable postoperative changes with thoracic aortic stent graft.

## 2017-07-13 IMAGING — CT CT CTA ABD/PEL W/CM AND/OR W/O CM
2 of 9 series · 12 of 46 positions shown, 14 images · IV contrast (omnipaque)
Comparison: 11/08/2014

CLINICAL DATA: 44-year-old with chest and abdominal pain. History
of thoracic aortic stent graft placement and aortic valve
replacement in September 2014. Swelling in the right groin. History of
aortic dissection.

EXAM:
CT ANGIOGRAPHY CHEST, ABDOMEN AND PELVIS
TECHNIQUE: Multidetector CT imaging through the chest, abdomen and pelvis was
performed using the standard protocol during bolus administration of
intravenous contrast. Multiplanar reconstructed images and MIPs were
obtained and reviewed to evaluate the vascular anatomy.
CONTRAST:  100mL OMNIPAQUE IOHEXOL 350 MG/ML SOLN

[Series 4: dissection 2.0 i30f 1 · axial · 0.73mm/px · z∈[+890,+1476]mm · 9 of 335 slices shown, 11 images]
[im 21/335  soft-tissue]
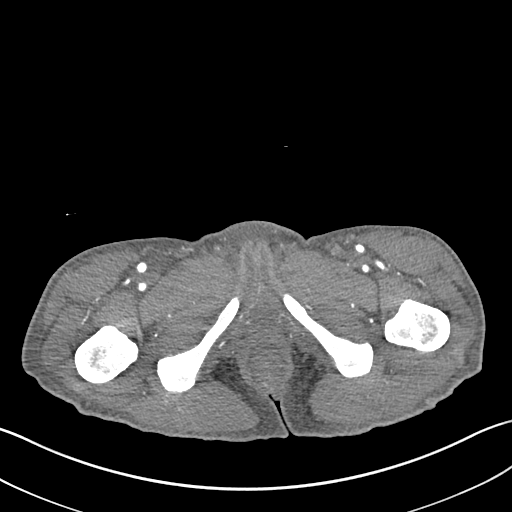
[im 21/335  bone]
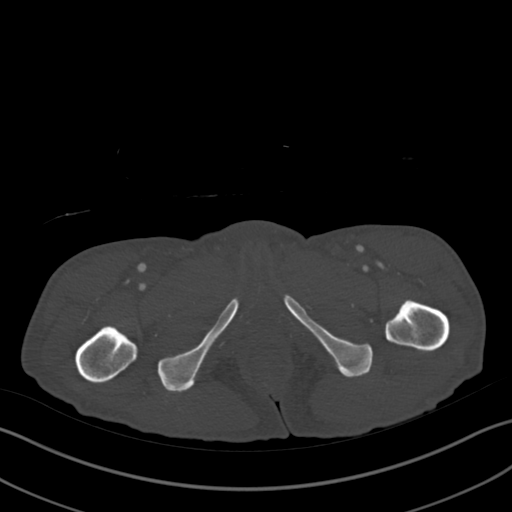
[im 63/335  soft-tissue]
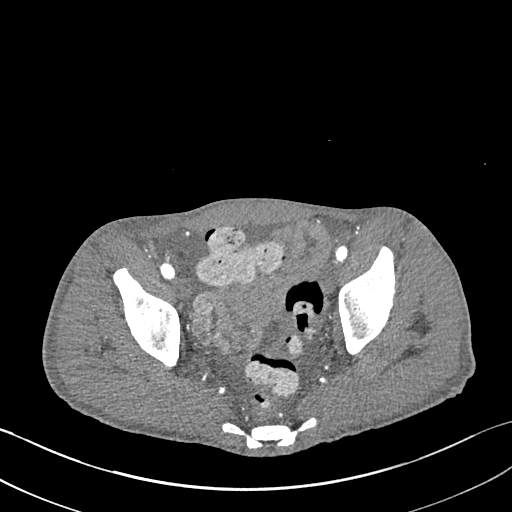
[im 105/335  soft-tissue]
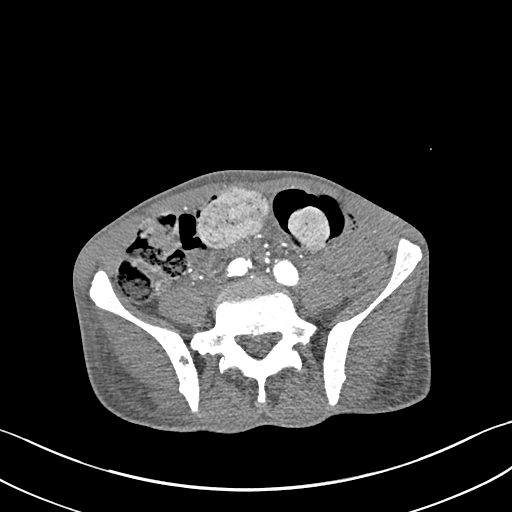
[im 126/335  soft-tissue]
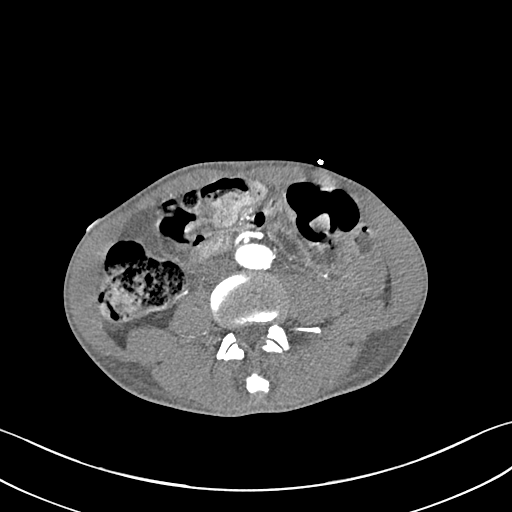
[im 168/335  soft-tissue]
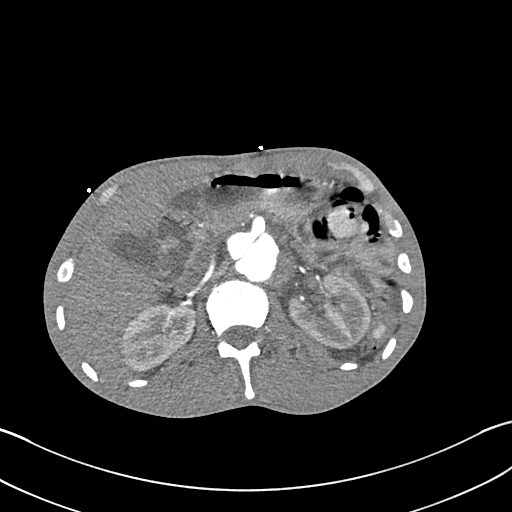
[im 209/335  soft-tissue]
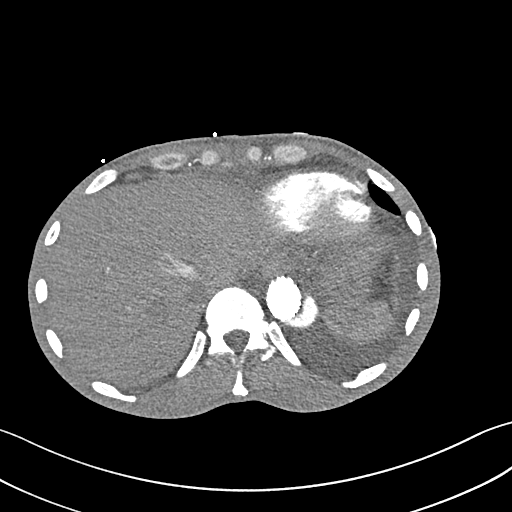
[im 230/335  soft-tissue]
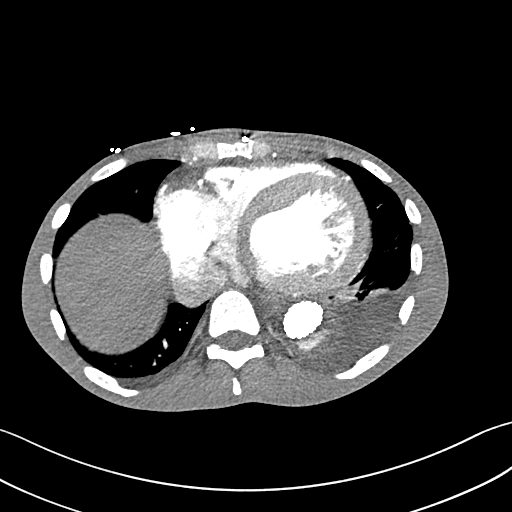
[im 272/335  soft-tissue]
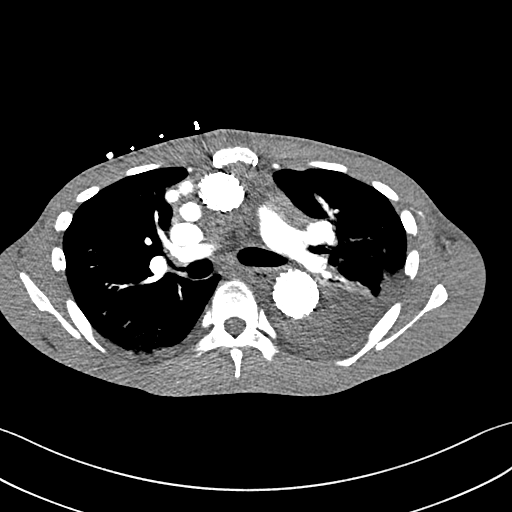
[im 314/335  soft-tissue]
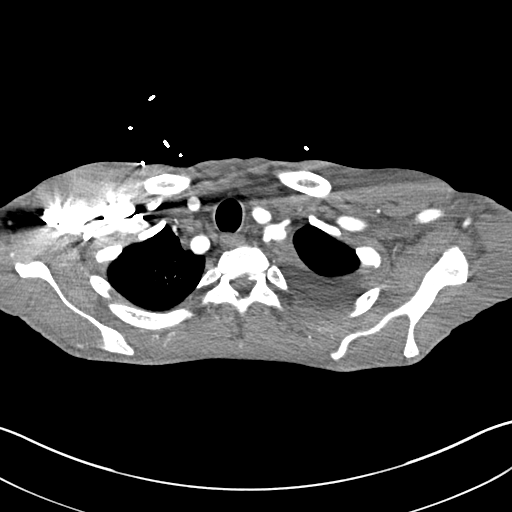
[im 314/335  bone]
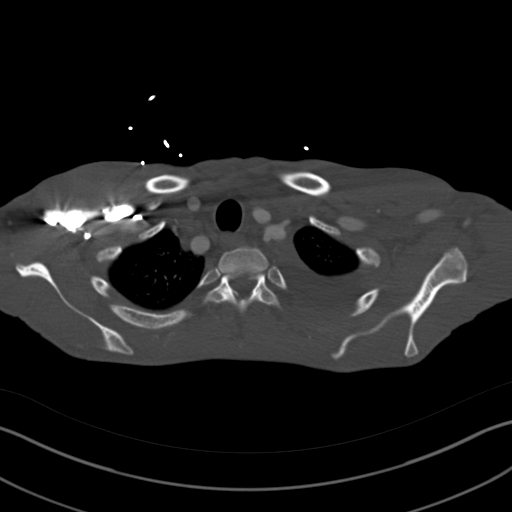

[Series 7: coronal mpr · coronal · 0.93mm/px · 3 of 120 slices shown]
[im 30/120  soft-tissue]
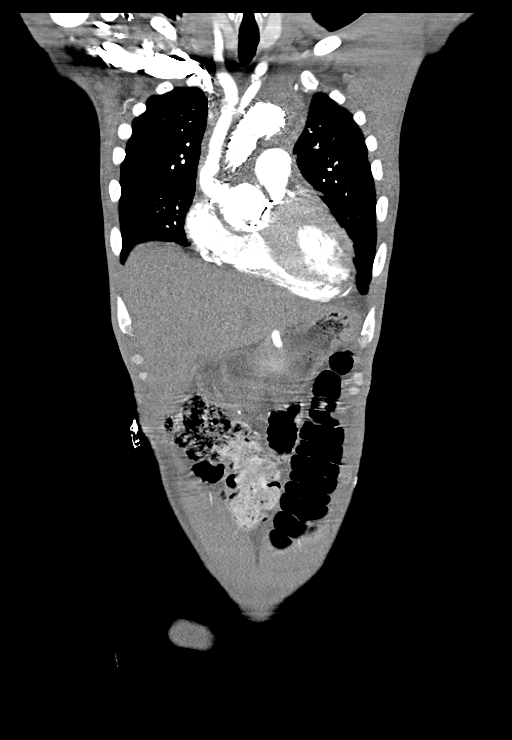
[im 60/120  soft-tissue]
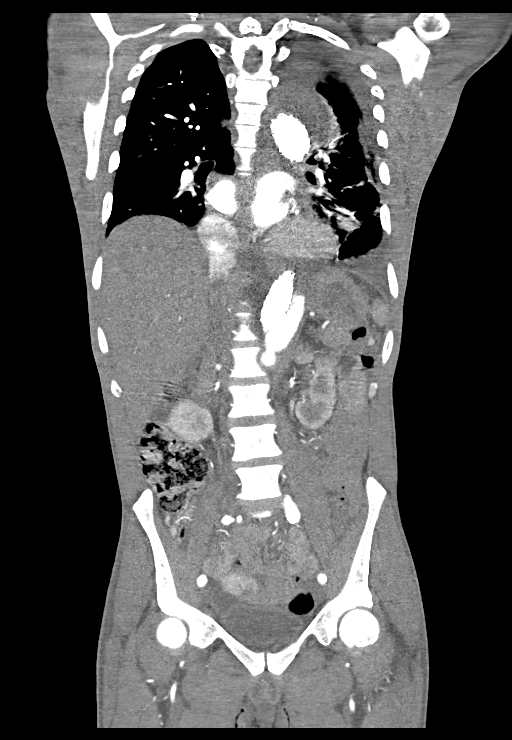
[im 90/120  soft-tissue]
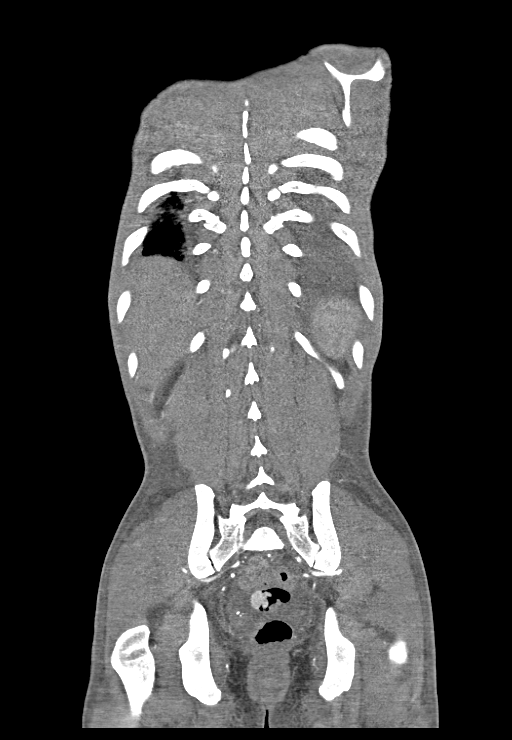

[12 of 46 positions shown; findings below may reference images not displayed]

FINDINGS: CTA CHEST FINDINGS

Postsurgical changes include aortic valve replacement with an aortic
stent graft involving the ascending thoracic aorta, aortic arch and
descending thoracic aorta. There is a surgical graft originating
from the ascending thoracic aorta just proximal to the stent graft.
The surgical graft is patent and supplies blood flow to the great
vessels. Aortic root at sinuses of Valsalva measure up to 4.3 cm and
essentially unchanged. There is flow in the main left and right
coronary arteries. The aortic stent graft is widely patent. There is
flow in bilateral proximal vertebral arteries. The dissection
involving the descending thoracic aorta is difficult to evaluate due
to poor opacification of the false lumen.Descending thoracic aorta
measures 5.3 x 4.3 cm on sequence 4, image 66, previously measured
4.3 x 5.2 cm in a similar level. There appears to be retrograde
filling of the dissection false lumen. The aortic stent graft
terminates near the aortic hiatus.

There continues to be a moderate to large sized left pleural
effusion with compressive atelectasis. No significant pericardial
fluid. No significant chest lymphadenopathy. No evidence for
pulmonary embolism.

The trachea and mainstem bronchi are patent. Volume loss and
compressive atelectasis in the left lower lobe. Patchy ground-glass
densities in the left upper lobe may be related to volume loss.
Probable atelectasis and volume loss in the right lower lobe.

Review of the MIP images confirms the above findings.

CTA ABDOMEN AND PELVIS FINDINGS

There is an abdominal aortic dissection with retrograde filling of
the thoracic false lumen. The proximal abdominal aorta at the level
of the celiac trunk origin measures roughly 4 cm and unchanged.
Celiac trunk is fed by the false lumen there is flow artifact versus
dissection in the celiac trunk. The SMA is fed by the true lumen and
patent. Right renal artery is supplied by the true lumen and patent.
The left renal artery is supplied by the false lumen and patent.
There continues to be narrowing at the origin the left renal artery
likely related to the configuration of the dissection. The
juxtarenal abdominal aorta roughly measures 5.0 x 3.1 cm and
minimally changed. The infrarenal abdominal aorta is tortuous. The
inferior mesenteric artery is patent. Focal ectasia of the IMA on
sequence 4, image 254 is unchanged. Dissection terminates in the
infrarenal abdominal aorta. The left iliac arteries and proximal
left femoral arteries are patent the with stable atherosclerotic
disease involving the external and internal iliac arteries. The
right iliac arteries and proximal right femoral arteries are patent.
Again noted is a low-density collection superficial to the right
common femoral artery that measures 5.0 x 4.4 cm and stable in size.

Evaluation of the solid origins is limited due to the arterial phase
of contrast. There is no gross abnormality to the liver or
gallbladder. No gross abnormality to the spleen or pancreas. Adrenal
glands are not well visualized. No gross abnormality to the kidneys.
There is symmetric profusion to both kidneys. There appears to be
fluid in the lower abdomen and pelvis. Very difficult to evaluate
the bowel structures due to the lack of fat and the timing of the
contrast. There is a large amount of stool in the colon. Difficult
to exclude bowel wall thickening particularly in the sigmoid colon
on sequence 4, image 268. There are small irregular glass
collections in the left lower abdomen on sequence 4, image 240 which
are indeterminate. It is unclear if these are intraluminal. No other
clear evidence for pneumoperitoneum.

No acute bone abnormality. There is a stable small lucency involving
the right ilium along the right side of the SI joint. This is
nonspecific and probably incidental finding.

Review of the MIP images confirms the above findings.
IMPRESSION: Stable postsurgical changes in the chest. The surgical graft and
aortic stent graft are patent. There is no significant change in the
size of the descending thoracic aorta although difficult to measure
due to poor visualization of the false lumen in the upper chest.

No evidence for pulmonary embolism.

Stable moderate to large sized left pleural effusion. Evidence for
atelectasis throughout the lung, particularly in the left lower
lobe.

Stable configuration of the aortic dissection in the abdomen which
terminates in the infrarenal abdominal aorta.

There appears to be fluid in the lower abdomen and pelvis of
uncertain etiology. Limited evaluation of the small and large bowel
structures. There is concern for possible bowel wall thickening
involving the sigmoid colon. Questionable gas collections in the
left lower quadrant. Further evaluation of the distal colon could be
performed with a water-soluble barium enema. Alternatively, further
evaluation of the abdomen and pelvis could be performed with a
repeat CT during the portal venous phase of contrast.

There is a stable low-density collection superficial to the right
common femoral artery. Suspect this represents a postoperative fluid
collection.

These results were called by telephone at the time of interpretation
on 11/15/2014 at [DATE] to NAHUUEL AYBAR , who verbally
acknowledged these results.

## 2017-07-17 IMAGING — DX DG CHEST 1V
1 series · 1 of 1 positions shown · non-contrast
Comparison: 11/15/2014; 11/08/2014; chest CT - 11/15/2014

CLINICAL DATA: Chest pain and shortness of breath for 3 days.
History of stent and aortic valve surgery.

EXAM:
CHEST  1 VIEW

[chest ap]
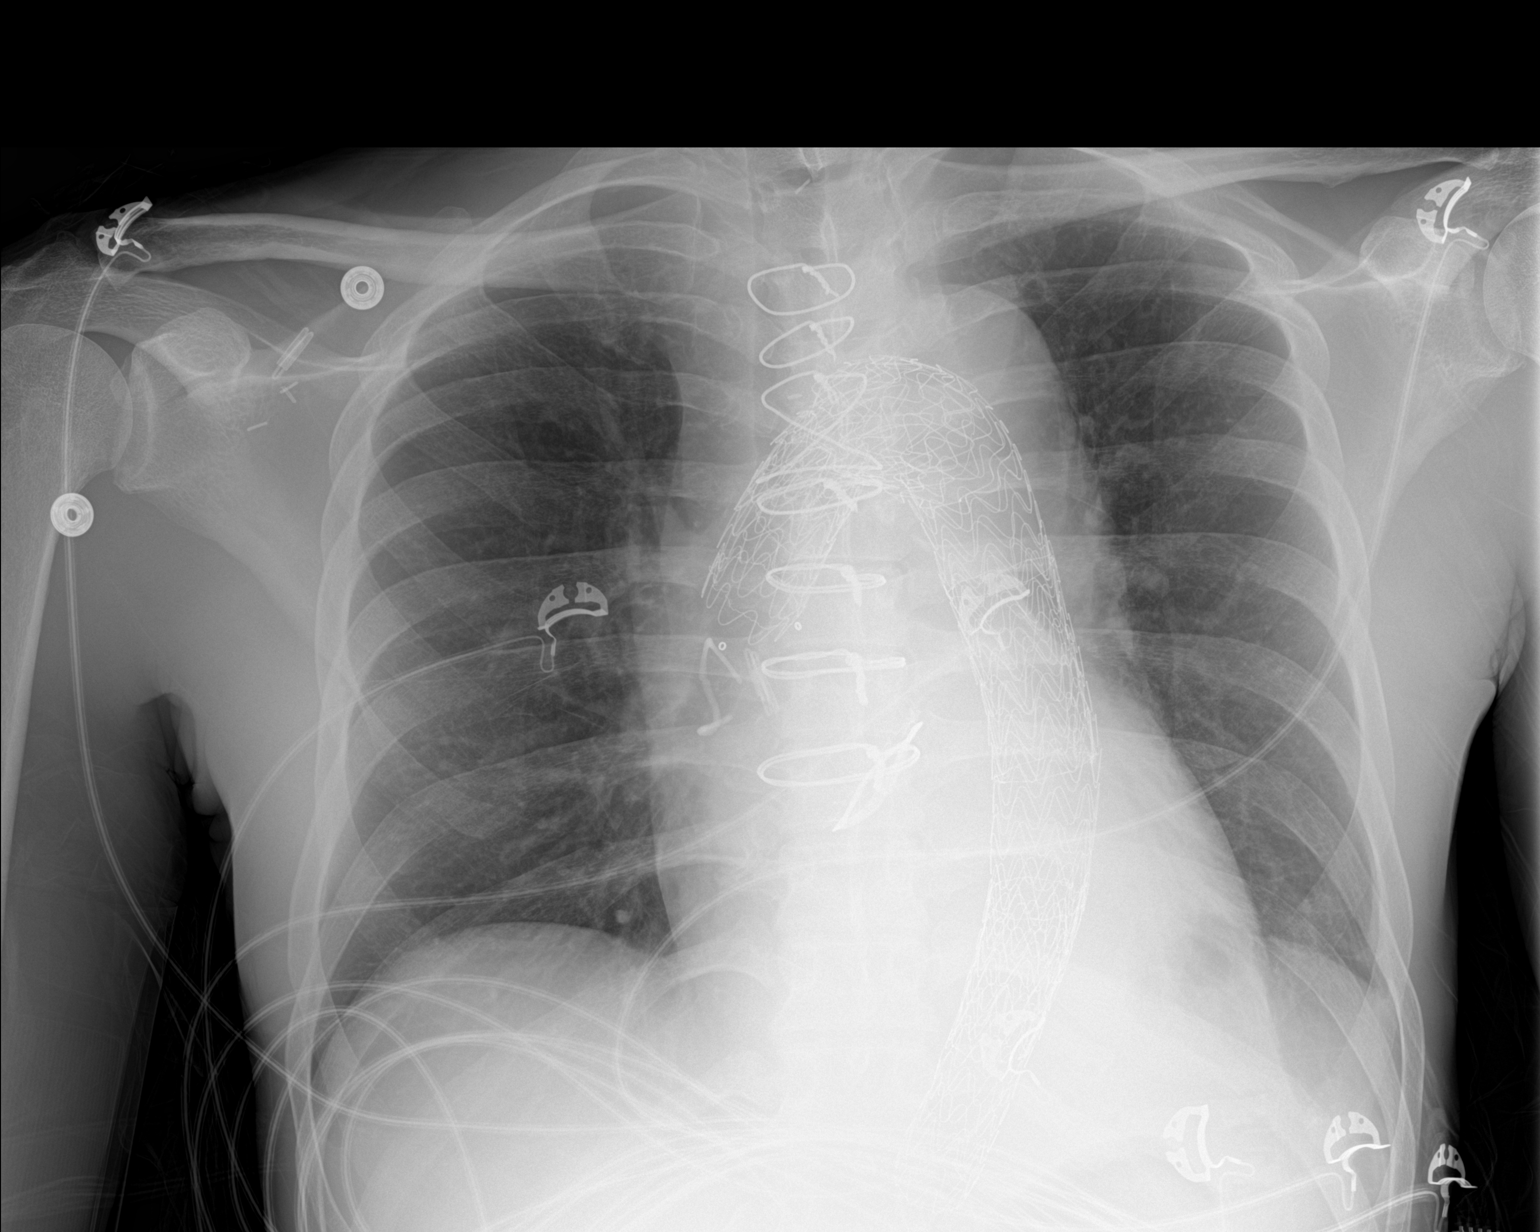

[1 of 1 positions shown; findings below may reference images not displayed]

FINDINGS: Grossly unchanged cardiac silhouette and mediastinal contours post
median sternotomy, CABG and valve replacement. Post endovascular
repair of thoracic aortic aneurysm. No focal airspace opacities. No
pleural effusion or pneumothorax. No evidence of edema. Surgical
clips overlie the right axilla. No acute osseous abnormalities.
IMPRESSION: Stable extensive postsurgical change of the chest without acute
cardiopulmonary disease.

## 2017-07-20 ENCOUNTER — Telehealth: Payer: Self-pay | Admitting: Cardiology

## 2017-07-20 ENCOUNTER — Ambulatory Visit (INDEPENDENT_AMBULATORY_CARE_PROVIDER_SITE_OTHER): Payer: Medicare Other | Admitting: Physician Assistant

## 2017-07-20 NOTE — Telephone Encounter (Signed)
Spoke w/ pt and requested that he send a manual transmission b/c his home monitor has not updated in at least 14 days.   

## 2017-07-22 LAB — CUP PACEART REMOTE DEVICE CHECK
Date Time Interrogation Session: 20190408013931
MDC IDC PG IMPLANT DT: 20180406

## 2017-07-23 ENCOUNTER — Other Ambulatory Visit: Payer: Self-pay | Admitting: Cardiology

## 2017-07-23 ENCOUNTER — Other Ambulatory Visit: Payer: Self-pay | Admitting: Internal Medicine

## 2017-07-23 ENCOUNTER — Ambulatory Visit (INDEPENDENT_AMBULATORY_CARE_PROVIDER_SITE_OTHER): Payer: Medicare Other | Admitting: *Deleted

## 2017-07-23 DIAGNOSIS — Z95828 Presence of other vascular implants and grafts: Secondary | ICD-10-CM | POA: Diagnosis not present

## 2017-07-23 DIAGNOSIS — I1310 Hypertensive heart and chronic kidney disease without heart failure, with stage 1 through stage 4 chronic kidney disease, or unspecified chronic kidney disease: Secondary | ICD-10-CM | POA: Diagnosis not present

## 2017-07-23 DIAGNOSIS — I639 Cerebral infarction, unspecified: Secondary | ICD-10-CM

## 2017-07-23 DIAGNOSIS — Z8679 Personal history of other diseases of the circulatory system: Secondary | ICD-10-CM | POA: Diagnosis not present

## 2017-07-23 DIAGNOSIS — I790 Aneurysm of aorta in diseases classified elsewhere: Secondary | ICD-10-CM | POA: Diagnosis not present

## 2017-07-23 DIAGNOSIS — Z79899 Other long term (current) drug therapy: Secondary | ICD-10-CM | POA: Diagnosis not present

## 2017-07-23 DIAGNOSIS — Z09 Encounter for follow-up examination after completed treatment for conditions other than malignant neoplasm: Secondary | ICD-10-CM | POA: Diagnosis not present

## 2017-07-23 DIAGNOSIS — N189 Chronic kidney disease, unspecified: Secondary | ICD-10-CM | POA: Diagnosis not present

## 2017-07-23 DIAGNOSIS — F17210 Nicotine dependence, cigarettes, uncomplicated: Secondary | ICD-10-CM | POA: Diagnosis not present

## 2017-07-23 DIAGNOSIS — J432 Centrilobular emphysema: Secondary | ICD-10-CM | POA: Diagnosis not present

## 2017-07-23 DIAGNOSIS — I10 Essential (primary) hypertension: Secondary | ICD-10-CM | POA: Diagnosis not present

## 2017-07-23 DIAGNOSIS — I716 Thoracoabdominal aortic aneurysm, without rupture: Secondary | ICD-10-CM | POA: Diagnosis not present

## 2017-07-23 DIAGNOSIS — Z953 Presence of xenogenic heart valve: Secondary | ICD-10-CM | POA: Diagnosis not present

## 2017-07-23 DIAGNOSIS — I7103 Dissection of thoracoabdominal aorta: Secondary | ICD-10-CM | POA: Diagnosis not present

## 2017-07-23 MED ORDER — CLONIDINE 0.3 MG/24HR TD PTWK: 0.3000 mg | MEDICATED_PATCH | TRANSDERMAL | 0 refills | Status: DC

## 2017-07-26 NOTE — Progress Notes (Signed)
[facility] Summary Report / Loop Recorder 

## 2017-07-29 DIAGNOSIS — Z7183 Encounter for nonprocreative genetic counseling: Secondary | ICD-10-CM | POA: Diagnosis not present

## 2017-07-29 DIAGNOSIS — I716 Thoracoabdominal aortic aneurysm, without rupture: Secondary | ICD-10-CM | POA: Diagnosis not present

## 2017-08-02 DIAGNOSIS — I716 Thoracoabdominal aortic aneurysm, without rupture: Secondary | ICD-10-CM | POA: Diagnosis not present

## 2017-08-02 DIAGNOSIS — Z952 Presence of prosthetic heart valve: Secondary | ICD-10-CM | POA: Diagnosis not present

## 2017-08-02 DIAGNOSIS — Z720 Tobacco use: Secondary | ICD-10-CM | POA: Diagnosis not present

## 2017-08-02 DIAGNOSIS — I639 Cerebral infarction, unspecified: Secondary | ICD-10-CM | POA: Diagnosis not present

## 2017-08-02 DIAGNOSIS — N280 Ischemia and infarction of kidney: Secondary | ICD-10-CM | POA: Diagnosis not present

## 2017-08-02 DIAGNOSIS — Z8679 Personal history of other diseases of the circulatory system: Secondary | ICD-10-CM | POA: Diagnosis not present

## 2017-08-02 DIAGNOSIS — I129 Hypertensive chronic kidney disease with stage 1 through stage 4 chronic kidney disease, or unspecified chronic kidney disease: Secondary | ICD-10-CM | POA: Diagnosis not present

## 2017-08-02 DIAGNOSIS — Z9889 Other specified postprocedural states: Secondary | ICD-10-CM | POA: Diagnosis not present

## 2017-08-02 DIAGNOSIS — N183 Chronic kidney disease, stage 3 (moderate): Secondary | ICD-10-CM | POA: Diagnosis not present

## 2017-08-04 ENCOUNTER — Telehealth: Payer: Self-pay | Admitting: Internal Medicine

## 2017-08-04 ENCOUNTER — Ambulatory Visit (INDEPENDENT_AMBULATORY_CARE_PROVIDER_SITE_OTHER): Payer: Self-pay | Admitting: *Deleted

## 2017-08-04 ENCOUNTER — Telehealth: Payer: Self-pay | Admitting: Cardiology

## 2017-08-04 DIAGNOSIS — R002 Palpitations: Secondary | ICD-10-CM

## 2017-08-04 LAB — CUP PACEART INCLINIC DEVICE CHECK
MDC IDC PG IMPLANT DT: 20180406
MDC IDC SESS DTM: 20190522160127

## 2017-08-04 NOTE — Telephone Encounter (Signed)
New message    Patient c/o Palpitations:  High priority if patient c/o lightheadedness, shortness of breath, or chest pain  1) How long have you had palpitations/irregular HR/ Afib? Are you having the symptoms now?  Palpitations for more than 5 days  2) Are you currently experiencing lightheadedness, SOB or CP? Dizziness, pain on right side  3) Do you have a history of afib (atrial fibrillation) or irregular heart rhythm? yes  4) Have you checked your BP or HR? (document readings if available): no  5) Are you experiencing any other symptoms? Patient states he feels like his heart is always racing

## 2017-08-04 NOTE — Progress Notes (Signed)
Loop check in clinic d/t c/o dizziness with increased HRs. Patient states that he can feel his heart "racing" with minimal exertion. Patient states that he doesn't check his HR when the episodes occur, so he is unsure how fast it gets. Patient states that he's been taking his medications as Rx'd. Battery status: GOOD. R-waves 0.20mV. 0 symptom episodes, 0 tachy episodes, 0 pause episodes, 0 brady episodes. 0 AF episodes (0% burden). Patient was given symptom activator - demonstration performed, patient verbalized understanding. Tachy detection decreased to 167bpm - ok per SK. Monthly summary reports and ROV with SK as scheduled.

## 2017-08-04 NOTE — Telephone Encounter (Signed)
LMTCB/sss  Attempted to notify patient of recommendations.

## 2017-08-04 NOTE — Telephone Encounter (Signed)
See phone note from 5/22 @ 1052 for details.

## 2017-08-04 NOTE — Telephone Encounter (Signed)
Patient presented to the office. See appointment notes for details.

## 2017-08-04 NOTE — Telephone Encounter (Signed)
Spoke w/ pt and requested that he send a manual transmission b/c his home monitor has not updated in at least 14 days. Pt stated that his heart has been racing, he feels like he could pass out when his heart is racing. He stated that he feels fine for now. Pt is going to send a remote transmission. Pt is aware that once the transmission is received a Device Tech RN will review and call him back w/ the results.

## 2017-08-04 NOTE — Telephone Encounter (Signed)
ILR remote reviewed. No episodes recorded since last transmission (07/20/17).  Called patient and informed him of remote results. Patient states that his heart races from minor activities. Patient states that he feels like he's going to pass out when this occurs. Patient states that he's been taking his medications as Rx'd.   I informed Dr.Klein of this information. Plan to reduce tachy detection to 167bpm and to provide patient with symptom activator.

## 2017-08-06 IMAGING — CT CT ANGIO CHEST
2 of 7 series · 12 of 46 positions shown · IV contrast (OMNI 350)
Comparison: CTA of the chest abdomen and pelvis 11/15/2014.

CLINICAL DATA: 44-year-old male with recurrent chest pain since
aortic procedure September 2014. History of aortic dissection.

EXAM:
CT ANGIOGRAPHY CHEST, ABDOMEN AND PELVIS
TECHNIQUE: Multidetector CT imaging through the chest, abdomen and pelvis was
performed using the standard protocol during bolus administration of
intravenous contrast. Multiplanar reconstructed images and MIPs were
obtained and reviewed to evaluate the vascular anatomy.
CONTRAST:  100mL OMNIPAQUE IOHEXOL 350 MG/ML SOLN

[Series 9: dissection 2mm · axial · 0.69mm/px · z∈[+842,+1438]mm · 9 of 336 slices shown]
[im 19/336  lung]
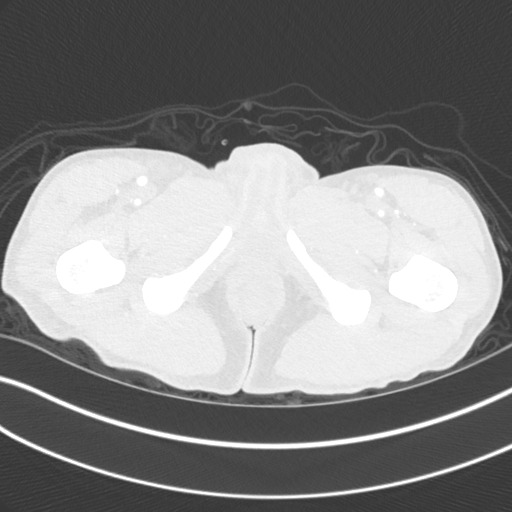
[im 56/336  soft-tissue]
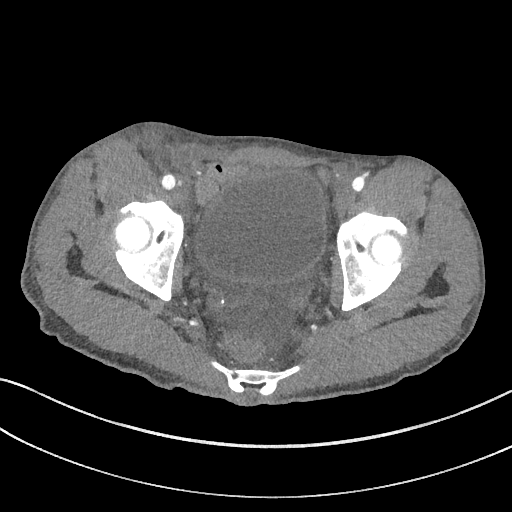
[im 94/336  lung]
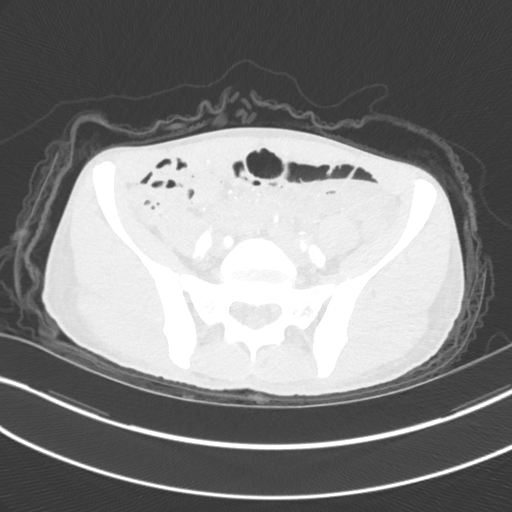
[im 131/336  soft-tissue]
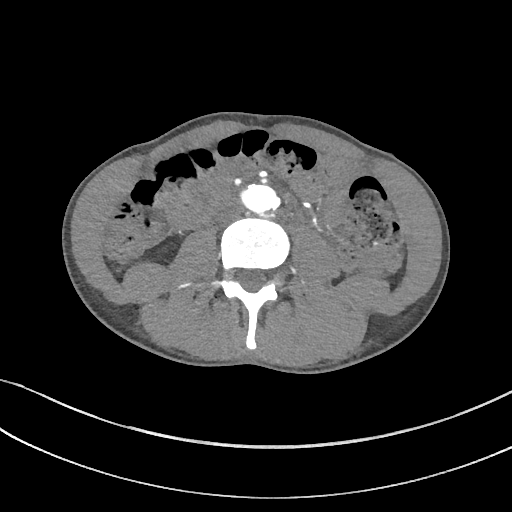
[im 168/336  lung]
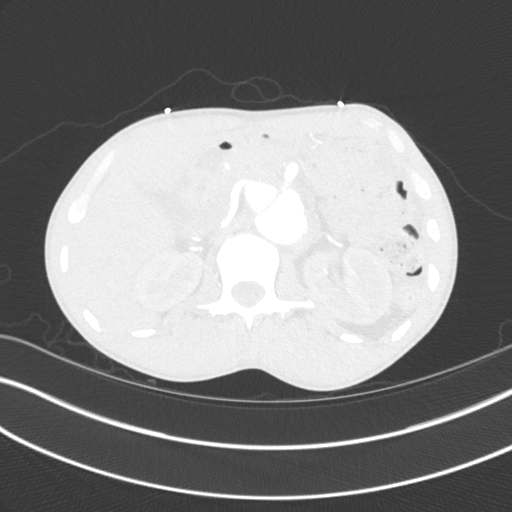
[im 205/336  soft-tissue]
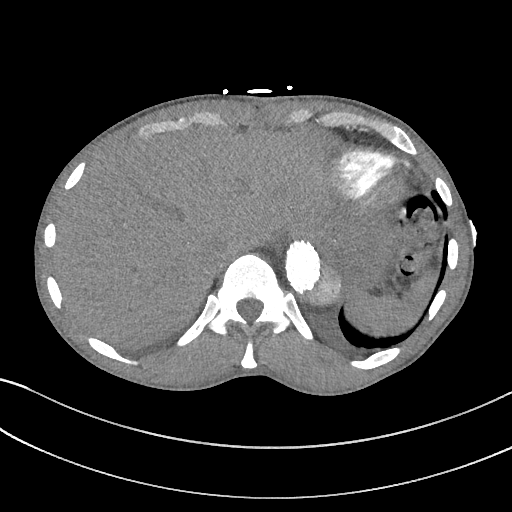
[im 242/336  lung]
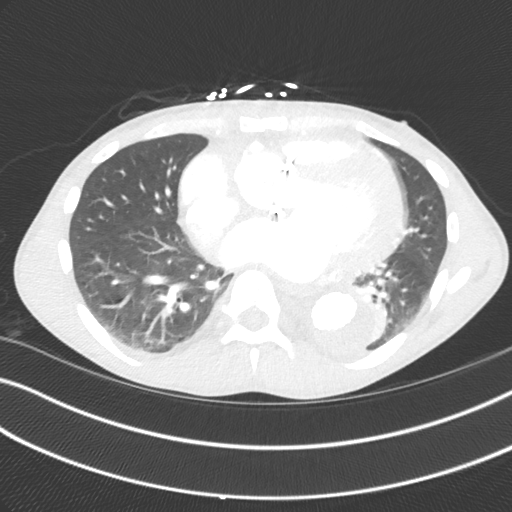
[im 280/336  soft-tissue]
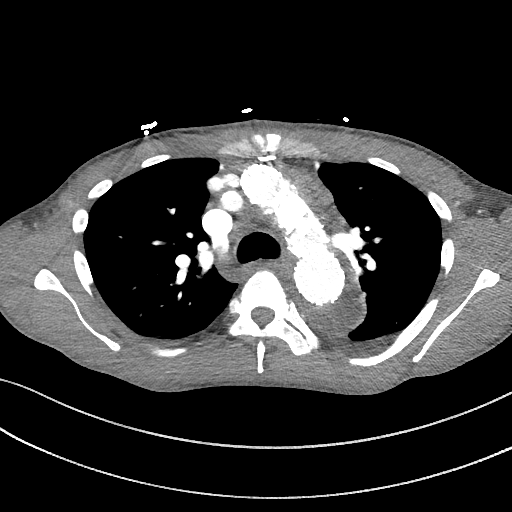
[im 317/336  lung]
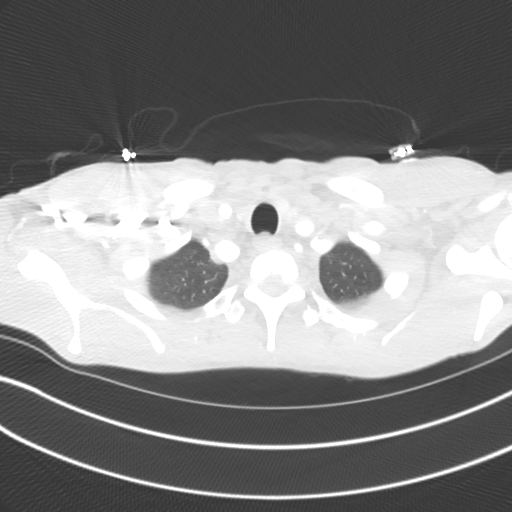

[Series 12: dissection 2mm cor · coronal · 0.71mm/px · 3 of 106 slices shown]
[im 27/106  soft-tissue]
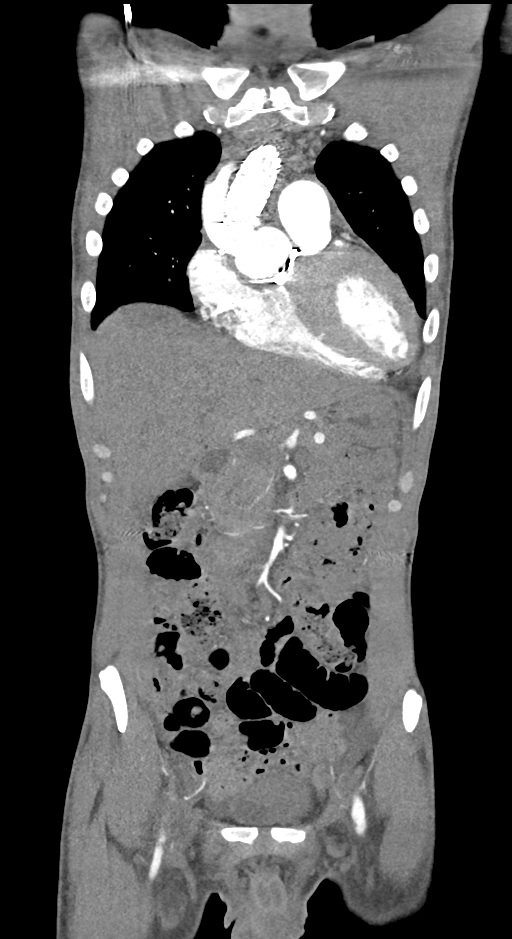
[im 53/106  soft-tissue]
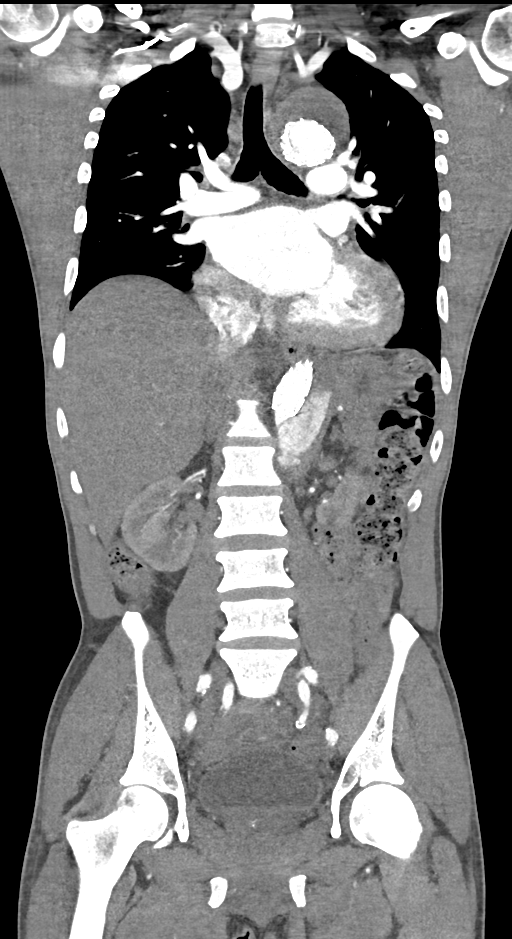
[im 79/106  soft-tissue]
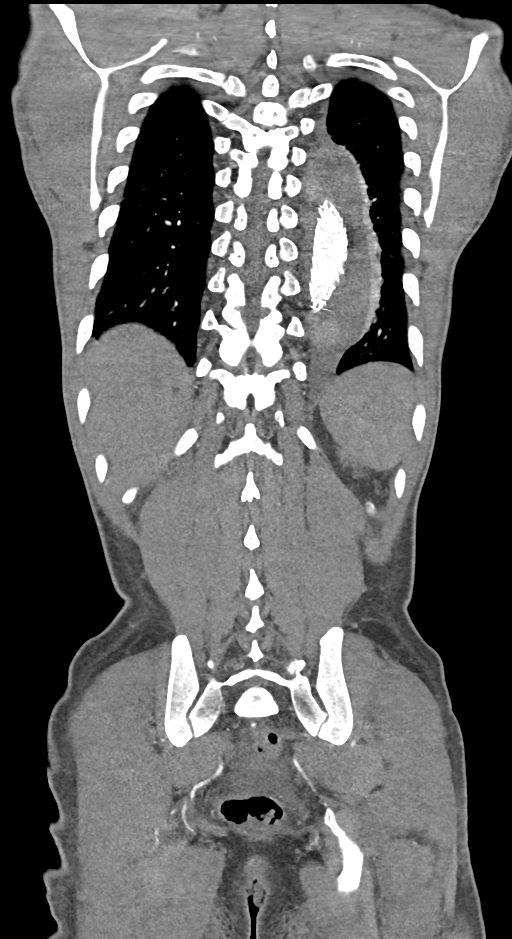

[12 of 46 positions shown; findings below may reference images not displayed]

FINDINGS: CTA CHEST FINDINGS

Mediastinum/Lymph Nodes: Status post median sternotomy for aortic
valve replacement with a stented bioprosthesis. Sinuses of Valsalva
are again mildly aneurysmal measuring up to 4.5 cm. There is also a
surgically placed graft extending from the proximal ascending
thoracic aorta to the great vessels of the mediastinum, which is
grossly patent without hemodynamically significant stenosis. In the
native thoracic aorta there is a stent graft which extends from the
proximal ascending thoracic aorta (originating just above the
previously mentioned surgical graft), extending throughout the
aortic arch and into the distal descending thoracic aorta. The stent
graft is grossly patent without evidence of hemodynamically
significant stenosis. The stent graft occupies the true lumen, and
there is some reflux of contrast material up into the distal aspect
of the false lumen, presumably from fenestrations in the dissection
flap within the upper abdomen. The outer diameter of the descending
thoracic aorta remains aneurysmal measuring up to 5.1 x 4.7 cm
(image 112 of series 9) distally on today's examination (slightly
increased when compared to the prior examination, where this was
estimated to measure approximately 4.9 x 4.3 cm on image 111 of
series 4 when measured in a similar fashion, although direct
comparison is limited by presence of left pleural fluid on the prior
study). Heart size is normal. There is no significant pericardial
fluid, thickening or pericardial calcification. No pathologically
enlarged mediastinal or hilar lymph nodes. Esophagus is unremarkable
in appearance. No axillary lymphadenopathy.

Lungs/Pleura: No acute consolidative airspace disease. Small left
pleural effusion in the medial aspect of the left hemithorax has
significantly decreased compared to the prior study. No suspicious
appearing pulmonary nodules or masses. Areas of mild subsegmental
atelectasis are noted in the lower lobes of the lungs bilaterally
(left greater than right). Small thin-walled cyst in the posterior
aspect of the left lower lobe.

Musculoskeletal/Soft Tissues: Median sternotomy wires. There are no
aggressive appearing lytic or blastic lesions noted in the
visualized portions of the skeleton.

Review of the MIP images confirms the above findings.

CTA ABDOMEN AND PELVIS FINDINGS

Hepatobiliary: Sub cm low-attenuation lesion in segment 6 of the
liver is too small to definitively characterize, but is
statistically likely a small cyst. No intra or extrahepatic biliary
ductal dilatation. Gallbladder is nearly completely decompressed,
but otherwise unremarkable in appearance.

Pancreas: No pancreatic mass. No pancreatic ductal dilatation. No
pancreatic or peripancreatic fluid or inflammatory changes.

Spleen: Unremarkable.

Adrenals/Urinary Tract: Bilateral adrenal glands and bilateral
kidneys are grossly normal in appearance. No hydroureteronephrosis
or perinephric stranding to indicate urinary tract obstruction at
this time. Urinary bladder is normal in appearance.

Stomach/Bowel: Stomach is normal in appearance. No pathologic
dilatation of small bowel or colon.

Vascular/Lymphatic: There is continuation of the thoracic aortic
dissection into the upper abdomen, with the dissection flap
terminating at a level shortly beneath the superior mesenteric
artery origin, and before the inferior mesenteric artery origin. The
celiac axis arises off of the false lumen. There is focal aneurysmal
dilatation of the distal celiac axis measuring up to 12 mm in
diameter. Superior mesenteric artery arises off of the true lumen
and is widely patent. The IMA arises off the infrarenal abdominal
aorta well below the level of the aortic dissection and is patent.
Single renal arteries bilaterally. Right renal artery is widely
patent arising off of the true lumen. Left renal artery appears
mildly stenotic at its origin off the false lumen (image 179 of
series 9). Suprarenal abdominal aorta remains aneurysmal measuring
up to 3.9 x 4.0 cm. Below the level of the dissection, the aorta
returns to normal caliber measuring 1.9 x 1.9 cm. Aneurysmal
dilatation of the left common iliac artery measuring up to 1.7 cm in
diameter. Moderate stenosis of the origin of the left internal iliac
artery. No lymphadenopathy noted in the abdomen or pelvis.

Other: Interval enlargement of a 3.8 x 6.3 cm low-attenuation lesion
in the right groin immediately anterior to the right common femoral
artery. No significant volume of ascites. No pneumoperitoneum.

Musculoskeletal: There are no aggressive appearing lytic or blastic
lesions noted in the visualized portions of the skeleton.

Review of the MIP images confirms the above findings.
IMPRESSION: 1. Postoperative changes of aortic valve replacement with a stented
bioprosthesis, vascular graft extending to the great vessels of the
mediastinum (which is grossly patent), and stent graft extending
from the proximal ascending thoracic aorta into the distal
descending thoracic aorta for treatment of type A dissection. This
graft remains widely patent. There is reflux of contrast material
into the false lumen in the distal descending thoracic aorta, and
the aneurysmal dilatation of the descending thoracic aorta appears
slightly increased (measuring up to 5.1 x 4.7 cm on today's
examination), as discussed above.
2. The aneurysm/dissection continues into the upper abdomen, where
the suprarenal abdominal aorta remains aneurysmal measuring up to
3.9 x 4.0 cm (similar to the prior study).
3. Mild stenosis at the origin of the left renal artery which arises
off of the false lumen.
4. Aneurysmal dilatation of the left common iliac artery measuring
up to 1.7 cm in diameter. Moderate stenosis of the origin of the
left internal iliac artery again noted.
5. Postoperative fluid collection in the right groin immediately
anterior to the right common femoral artery appears slightly larger
than the prior study, but remains low-attenuation, presumably a
postoperative seroma.
6. Near complete interval resolution of the previously noted
left-sided pleural effusion.
7. Additional incidental findings, as above.

## 2017-08-17 ENCOUNTER — Telehealth: Payer: Self-pay | Admitting: Cardiology

## 2017-08-17 LAB — CUP PACEART REMOTE DEVICE CHECK
Date Time Interrogation Session: 20190511020934
Implantable Pulse Generator Implant Date: 20180406

## 2017-08-17 NOTE — Telephone Encounter (Signed)
Spoke w/ pt and requested that he send a manual transmission b/c his home monitor has not updated in at least 14 days.   

## 2017-08-18 ENCOUNTER — Telehealth: Payer: Self-pay | Admitting: *Deleted

## 2017-08-18 NOTE — Telephone Encounter (Signed)
Received manual transmission from patient due to disconnected ILR monitor.  Transmission shows 2 "symptom" episodes.  Symptom ECG from 08/08/17 at 1437 shows ST w/artifact.  Patient reports he was mowing his lawn and felt like his heart was racing.  Symptom ECG from 08/05/17 shows SR/SB.  Patient denies presyncope/syncope.  He does experience ongoing constant chest discomfort in his center chest, non-exertional, stabbing pain, worse when lying on his stomach or when he is in the same position for too long.  Incisional pain has been well-documented in previous notes.  He is taking gabapentin and cardiac meds as prescribed, though patient states ASA was d/c at a recent [facility] f/u appt.  Advised patient I will review episodes with MD and call him back if any additional recommendations.  Patient is agreeable to this plan.

## 2017-08-20 NOTE — Telephone Encounter (Signed)
Spoke with patient, advised that Dr. Caryl Comes does not feel that the pain is cardiac.  No changes based on the "symptom" episode ECGs.  Keep current f/u plan.  Patient reports that the pain in his chest is more on the R-center chest area now.  It comes and goes, but is mostly noticeable when he is laying on his stomach.  It goes away when he lays on his back or his side.  He is able to reproduce the pain by pressing on that area.  Advised patient that this is not likely cardiac.  Encouraged him to discuss with his PCP.  Advised patient that symptom activator is to be used for new or worsening symptoms, specifically heart-racing sensation that he has reported before.  Advised that he should call our office if experiencing worsened symptom episodes.  Advised that we will continue monitoring remotely, and call him if abnormalities are noted.  Patient verbalizes understanding and denies questions or concerns a this time.

## 2017-08-23 ENCOUNTER — Other Ambulatory Visit: Payer: Self-pay | Admitting: Internal Medicine

## 2017-08-23 MED ORDER — CLONIDINE 0.3 MG/24HR TD PTWK: 0.3000 mg | MEDICATED_PATCH | TRANSDERMAL | 6 refills | Status: DC

## 2017-08-25 ENCOUNTER — Ambulatory Visit (INDEPENDENT_AMBULATORY_CARE_PROVIDER_SITE_OTHER): Payer: Medicare Other | Admitting: *Deleted

## 2017-08-25 DIAGNOSIS — I639 Cerebral infarction, unspecified: Secondary | ICD-10-CM

## 2017-08-26 NOTE — Progress Notes (Signed)
[facility] Summary Report / Loop Recorder 

## 2017-08-30 ENCOUNTER — Other Ambulatory Visit: Payer: Self-pay | Admitting: Internal Medicine

## 2017-08-30 IMAGING — DX DG CHEST 2V
2 series · 2 of 2 positions shown · non-contrast
Comparison: CT chest 12/09/2014 and chest radiograph 11/19/2014.

CLINICAL DATA: Right upper chest pain radiating to his back and
abdomen for 3 days. Hypertension, aortic valve replacement and
history of aortic dissection.

EXAM:
CHEST  2 VIEW

[w chest pa]
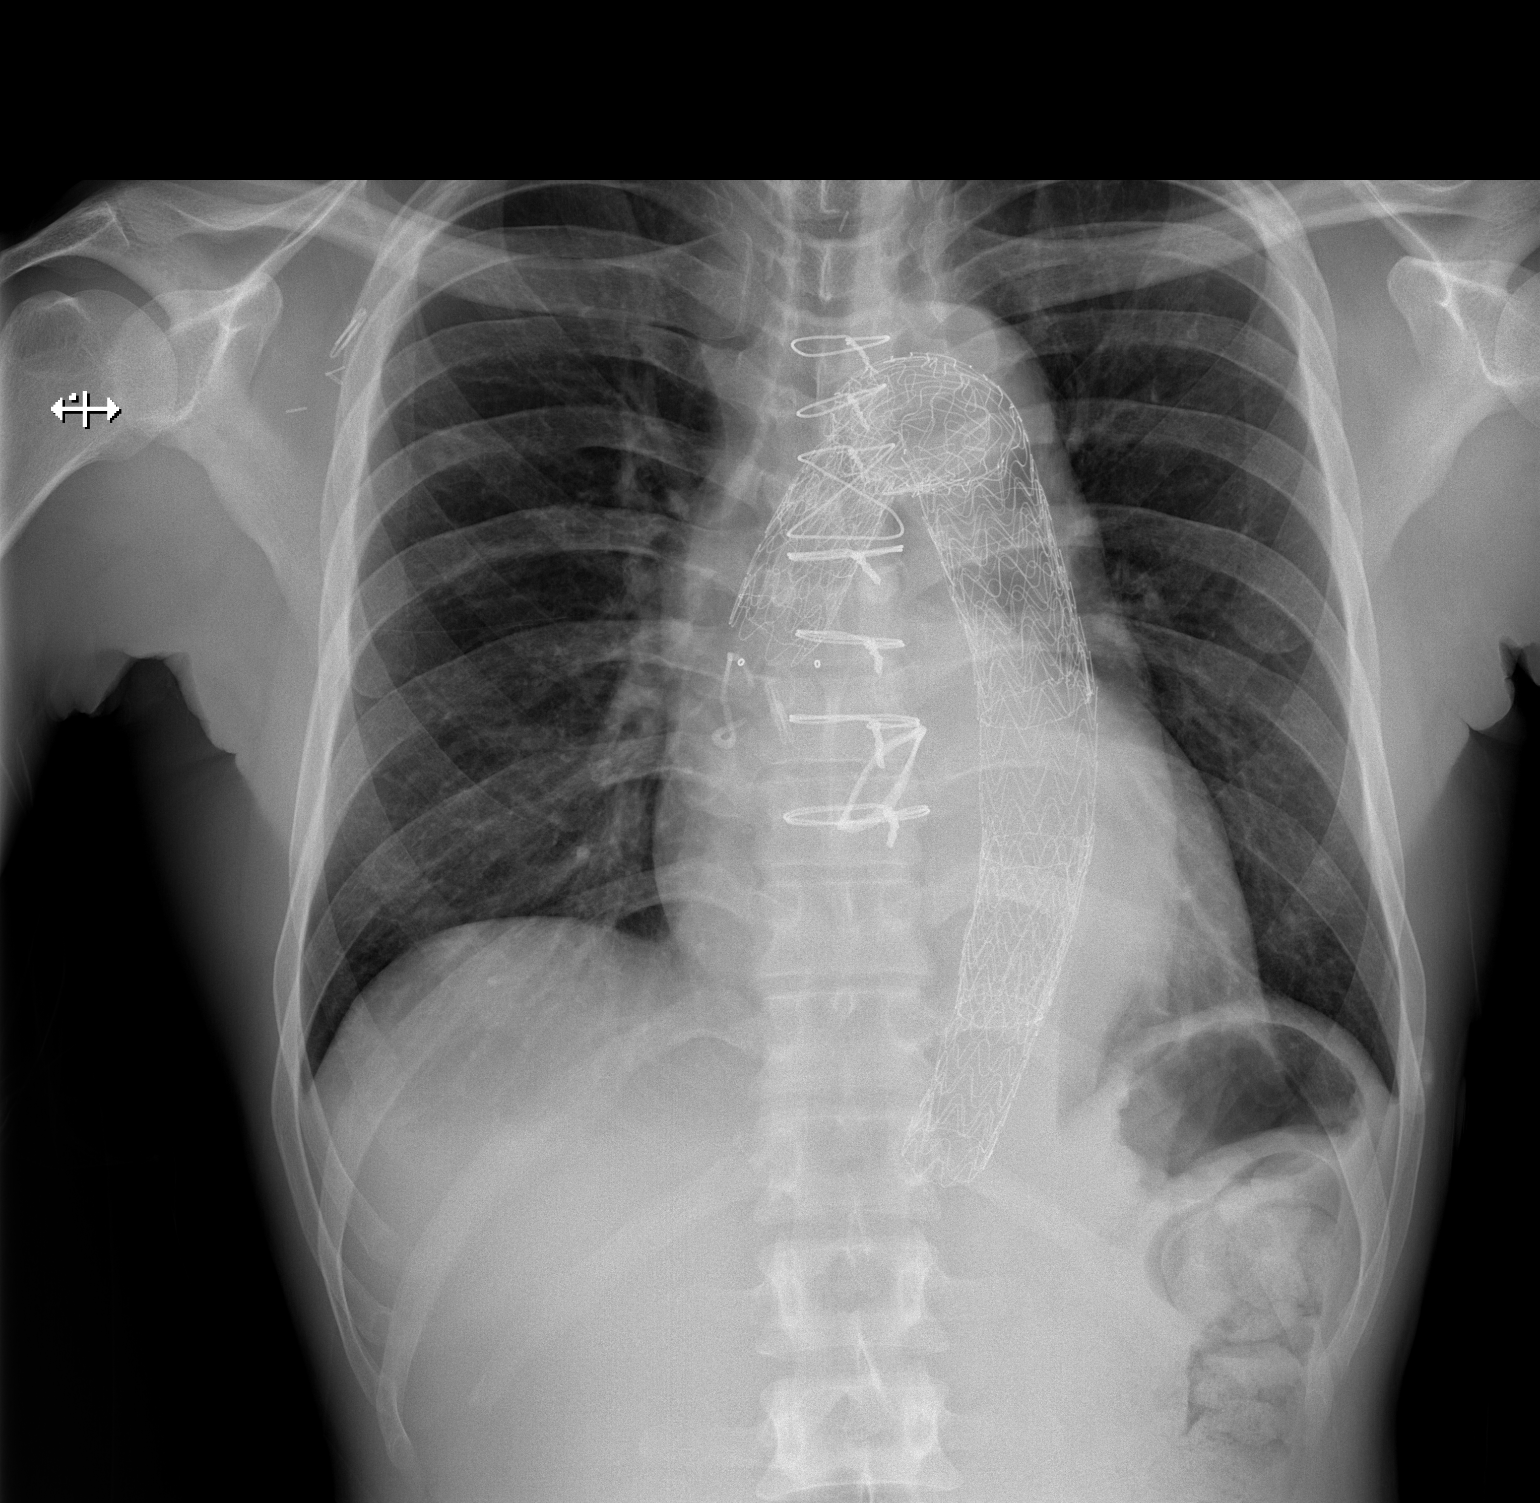

[w chest lat]
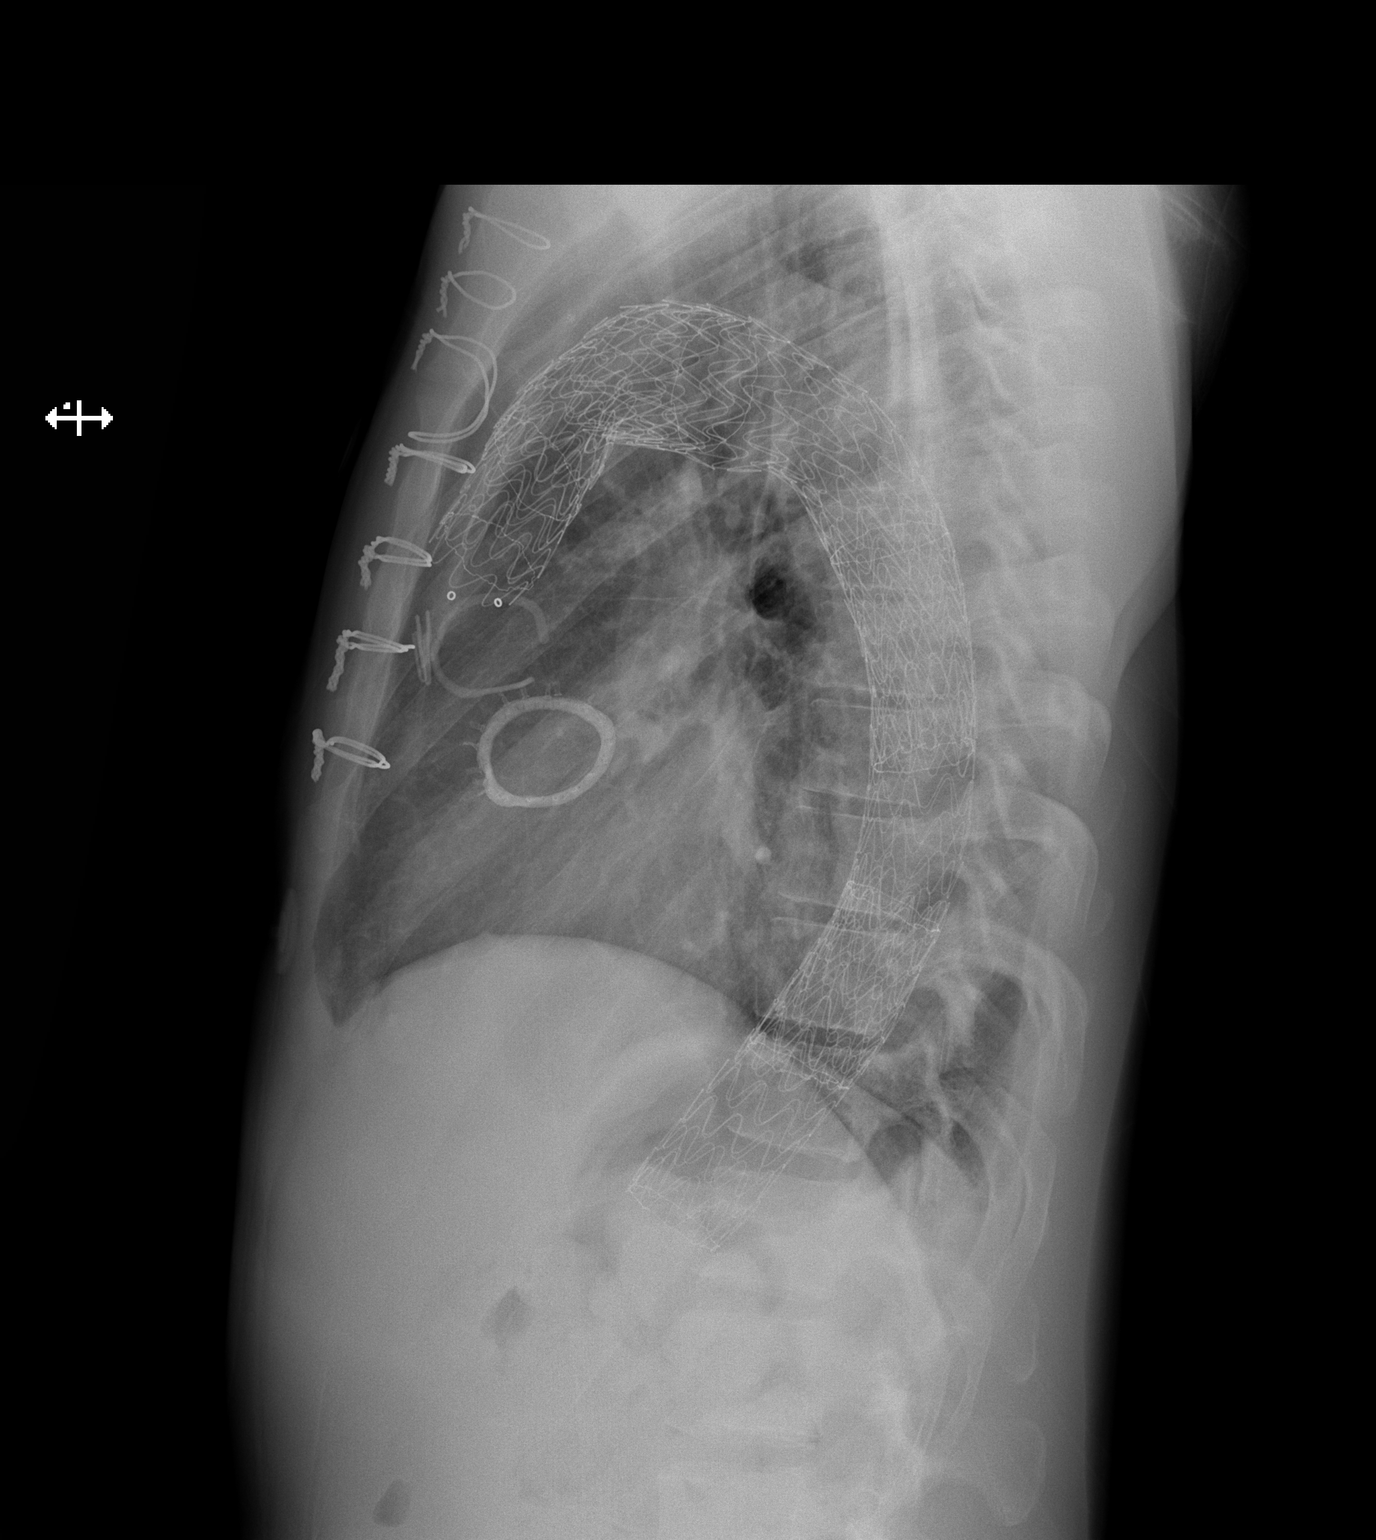

[2 of 2 positions shown; findings below may reference images not displayed]

FINDINGS: Trachea is midline. Heart size stable. Endovascular stent graft is
seen in the entire thoracic aorta. Aortic valve replacement. Lungs
are clear. No pleural fluid.
IMPRESSION: 1. No acute findings.
2. Endovascular stent graft repair of previous type B aortic
dissection.

## 2017-08-31 ENCOUNTER — Other Ambulatory Visit: Payer: Self-pay | Admitting: Internal Medicine

## 2017-09-15 DIAGNOSIS — N183 Chronic kidney disease, stage 3 (moderate): Secondary | ICD-10-CM | POA: Diagnosis not present

## 2017-09-15 DIAGNOSIS — Z9889 Other specified postprocedural states: Secondary | ICD-10-CM | POA: Diagnosis not present

## 2017-09-15 DIAGNOSIS — Z952 Presence of prosthetic heart valve: Secondary | ICD-10-CM | POA: Diagnosis not present

## 2017-09-15 DIAGNOSIS — Z8679 Personal history of other diseases of the circulatory system: Secondary | ICD-10-CM | POA: Diagnosis not present

## 2017-09-15 DIAGNOSIS — I716 Thoracoabdominal aortic aneurysm, without rupture: Secondary | ICD-10-CM | POA: Diagnosis not present

## 2017-09-15 DIAGNOSIS — I639 Cerebral infarction, unspecified: Secondary | ICD-10-CM | POA: Diagnosis not present

## 2017-09-15 DIAGNOSIS — I10 Essential (primary) hypertension: Secondary | ICD-10-CM | POA: Diagnosis not present

## 2017-09-15 DIAGNOSIS — Z720 Tobacco use: Secondary | ICD-10-CM | POA: Diagnosis not present

## 2017-09-15 DIAGNOSIS — N280 Ischemia and infarction of kidney: Secondary | ICD-10-CM | POA: Diagnosis not present

## 2017-09-27 ENCOUNTER — Ambulatory Visit (INDEPENDENT_AMBULATORY_CARE_PROVIDER_SITE_OTHER): Payer: Medicare Other | Admitting: *Deleted

## 2017-09-27 DIAGNOSIS — I639 Cerebral infarction, unspecified: Secondary | ICD-10-CM | POA: Diagnosis not present

## 2017-09-28 NOTE — Progress Notes (Signed)
[facility] Summary Report / Loop Recorder 

## 2017-09-30 ENCOUNTER — Other Ambulatory Visit: Payer: Self-pay

## 2017-09-30 ENCOUNTER — Emergency Department (HOSPITAL_COMMUNITY)
Admission: EM | Admit: 2017-09-30 | Discharge: 2017-10-01 | Disposition: A | Discharge status: Home / Self Care | Payer: Medicare Other | Attending: Emergency Medicine | Admitting: Emergency Medicine

## 2017-09-30 ENCOUNTER — Encounter (HOSPITAL_COMMUNITY): Payer: Self-pay | Admitting: Emergency Medicine

## 2017-09-30 ENCOUNTER — Emergency Department (HOSPITAL_COMMUNITY): Payer: Medicare Other

## 2017-09-30 DIAGNOSIS — I714 Abdominal aortic aneurysm, without rupture: Secondary | ICD-10-CM | POA: Diagnosis not present

## 2017-09-30 DIAGNOSIS — I5022 Chronic systolic (congestive) heart failure: Secondary | ICD-10-CM | POA: Insufficient documentation

## 2017-09-30 DIAGNOSIS — I2510 Atherosclerotic heart disease of native coronary artery without angina pectoris: Secondary | ICD-10-CM | POA: Diagnosis not present

## 2017-09-30 DIAGNOSIS — Z7982 Long term (current) use of aspirin: Secondary | ICD-10-CM | POA: Insufficient documentation

## 2017-09-30 DIAGNOSIS — Z8673 Personal history of transient ischemic attack (TIA), and cerebral infarction without residual deficits: Secondary | ICD-10-CM | POA: Diagnosis not present

## 2017-09-30 DIAGNOSIS — Z79899 Other long term (current) drug therapy: Secondary | ICD-10-CM | POA: Diagnosis not present

## 2017-09-30 DIAGNOSIS — I130 Hypertensive heart and chronic kidney disease with heart failure and stage 1 through stage 4 chronic kidney disease, or unspecified chronic kidney disease: Secondary | ICD-10-CM | POA: Insufficient documentation

## 2017-09-30 DIAGNOSIS — R0789 Other chest pain: Secondary | ICD-10-CM | POA: Insufficient documentation

## 2017-09-30 DIAGNOSIS — N183 Chronic kidney disease, stage 3 (moderate): Secondary | ICD-10-CM | POA: Diagnosis not present

## 2017-09-30 DIAGNOSIS — R079 Chest pain, unspecified: Secondary | ICD-10-CM | POA: Diagnosis not present

## 2017-09-30 DIAGNOSIS — F17210 Nicotine dependence, cigarettes, uncomplicated: Secondary | ICD-10-CM | POA: Diagnosis not present

## 2017-09-30 LAB — BASIC METABOLIC PANEL
ANION GAP: 6 (ref 5–15)
BUN: 16 mg/dL (ref 6–20)
CALCIUM: 9.2 mg/dL (ref 8.9–10.3)
CO2: 26 mmol/L (ref 22–32)
Chloride: 109 mmol/L (ref 98–111)
Creatinine, Ser: 1.85 mg/dL — ABNORMAL HIGH (ref 0.61–1.24)
GFR, EST AFRICAN AMERICAN: 49 mL/min — AB (ref >60)
GFR, EST NON AFRICAN AMERICAN: 42 mL/min — AB (ref >60)
GLUCOSE: 113 mg/dL — AB (ref 70–99)
POTASSIUM: 4.2 mmol/L (ref 3.5–5.1)
Sodium: 141 mmol/L (ref 135–145)

## 2017-09-30 LAB — CBC
HEMATOCRIT: 40.1 % (ref 39.0–52.0)
HEMOGLOBIN: 12.8 g/dL — AB (ref 13.0–17.0)
MCH: 31.1 pg (ref 26.0–34.0)
MCHC: 31.9 g/dL (ref 30.0–36.0)
MCV: 97.3 fL (ref 78.0–100.0)
Platelets: 86 10*3/uL — ABNORMAL LOW (ref 150–400)
RBC: 4.12 MIL/uL — AB (ref 4.22–5.81)
RDW: 13.1 % (ref 11.5–15.5)
WBC: 6.6 10*3/uL (ref 4.0–10.5)

## 2017-09-30 IMAGING — CT CT ANGIO CHEST
2 of 6 series · 16 of 46 positions shown · IV contrast (omnipaque)
Comparison: 01/02/2015

CLINICAL DATA: Patient has had severe low back pain since about
8;30 last night. The last time he has had pain like this was when
the repaired his heart valve at [facility] in September 2014. Patient states he
is suppose to have stomach surgery on [REDACTED] of next week. History
of hypertension.

EXAM:
CT ANGIOGRAPHY CHEST, ABDOMEN AND PELVIS
TECHNIQUE: Multidetector CT imaging through the chest, abdomen and pelvis was
performed using the standard protocol during bolus administration of
intravenous contrast. Multiplanar reconstructed images and MIPs were
obtained and reviewed to evaluate the vascular anatomy.
CONTRAST:  100mL OMNIPAQUE IOHEXOL 350 MG/ML SOLN

[Series 6: dissection 2mm · axial · 0.60mm/px · z∈[+792,+1392]mm · 13 of 326 slices shown]
[im 13/326  lung]
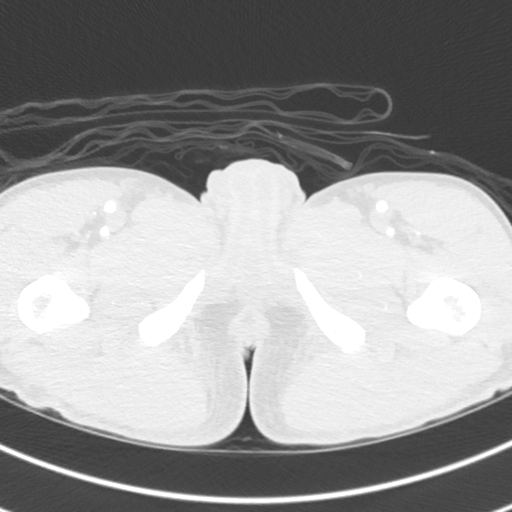
[im 38/326  soft-tissue]
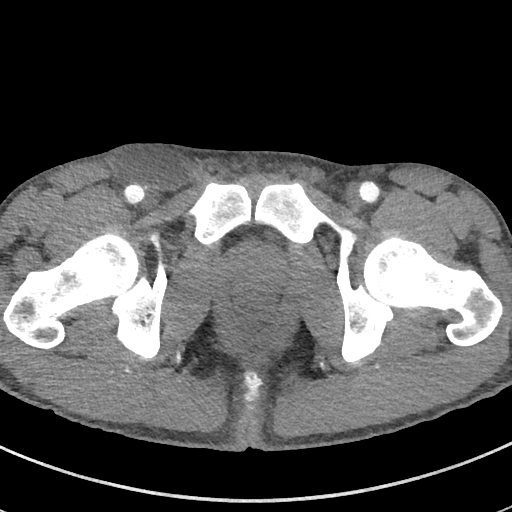
[im 63/326  lung]
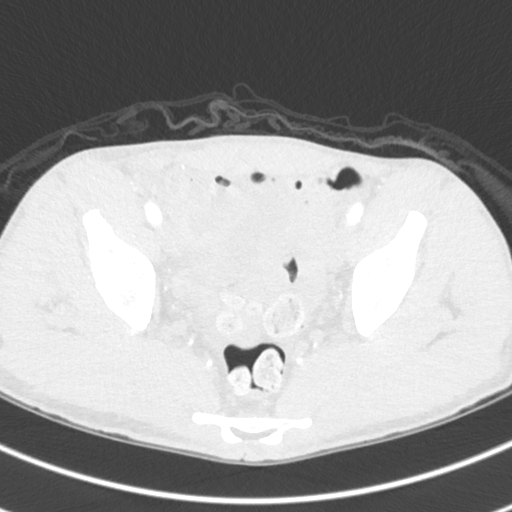
[im 88/326  soft-tissue]
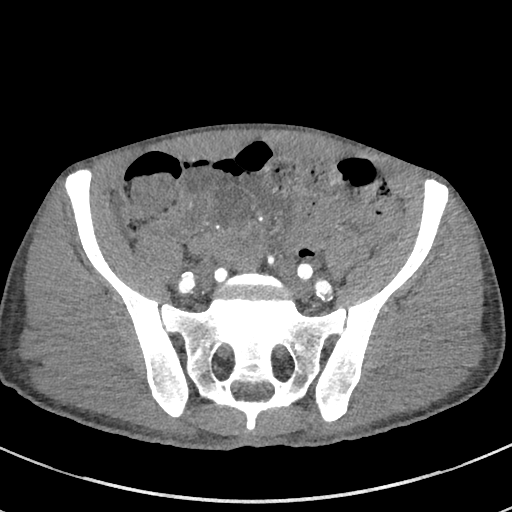
[im 113/326  lung]
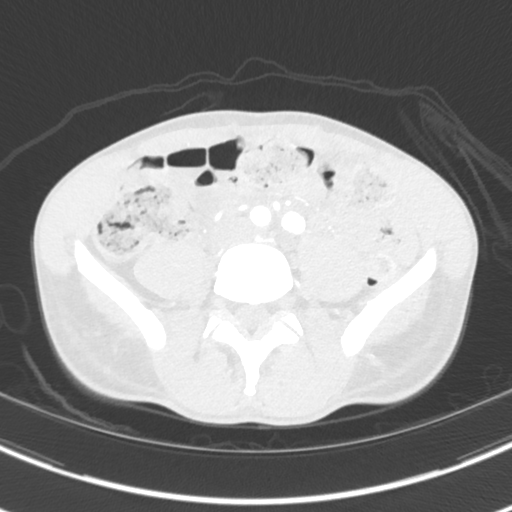
[im 138/326  soft-tissue]
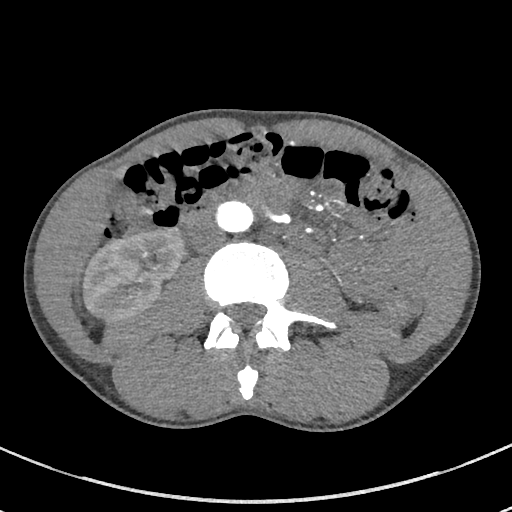
[im 163/326  lung]
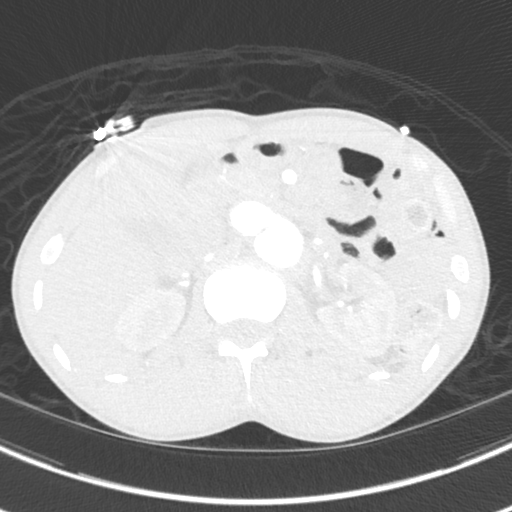
[im 188/326  soft-tissue]
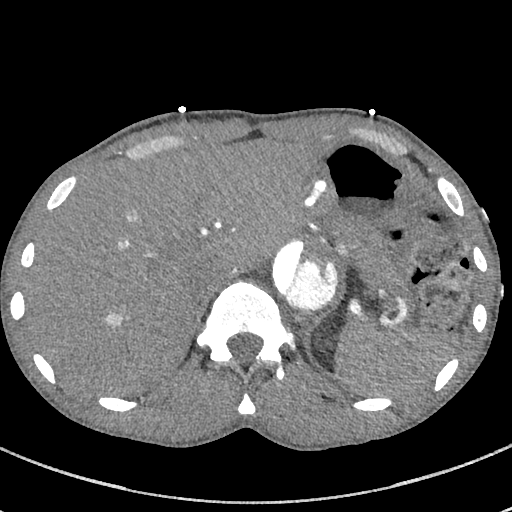
[im 213/326  lung]
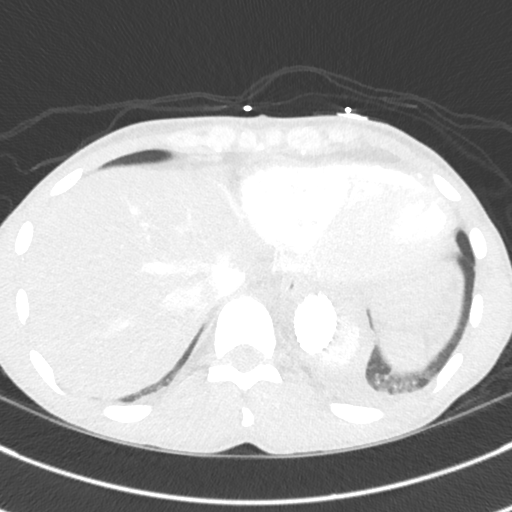
[im 238/326  soft-tissue]
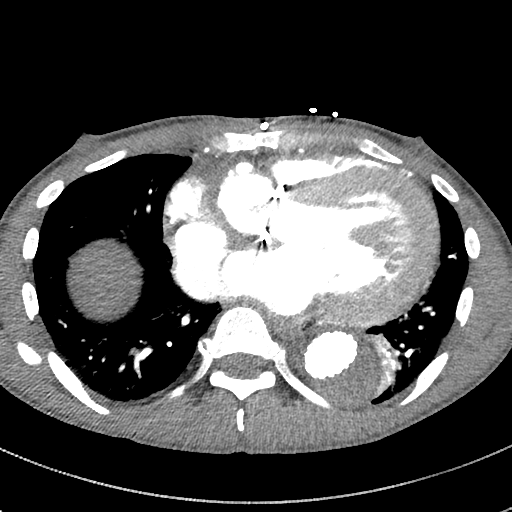
[im 263/326  lung]
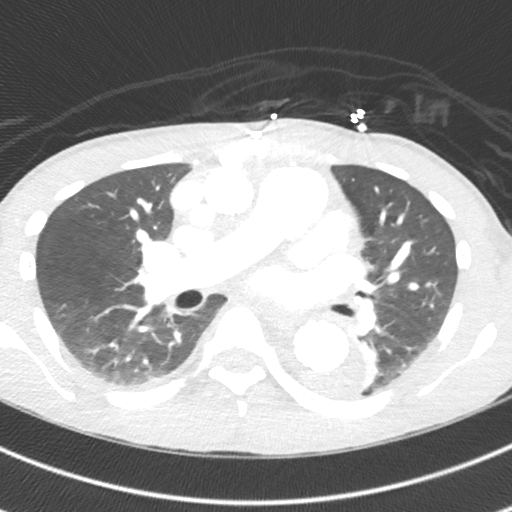
[im 288/326  soft-tissue]
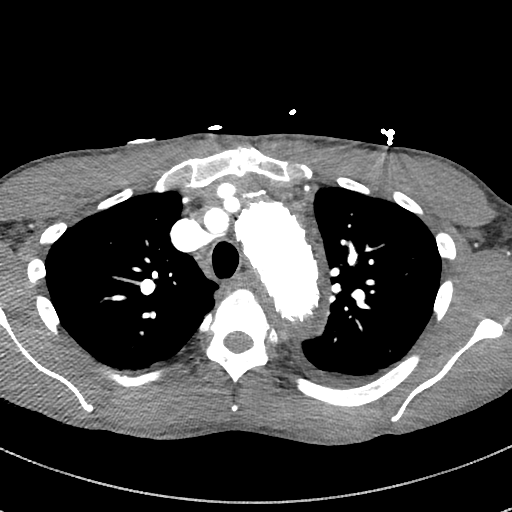
[im 313/326  lung]
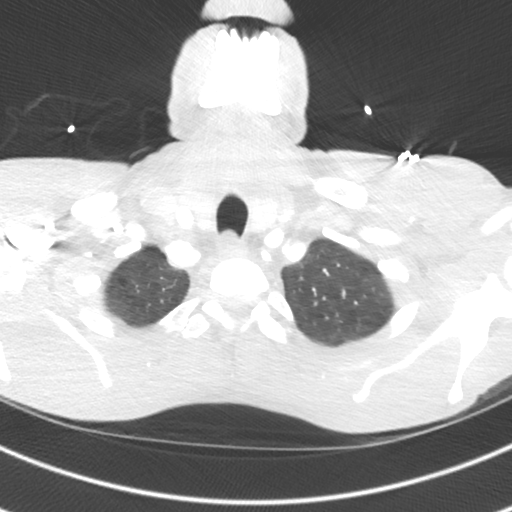

[Series 9: dissection 2mm cor · coronal · 1.27mm/px · 3 of 149 slices shown]
[im 38/149  soft-tissue]
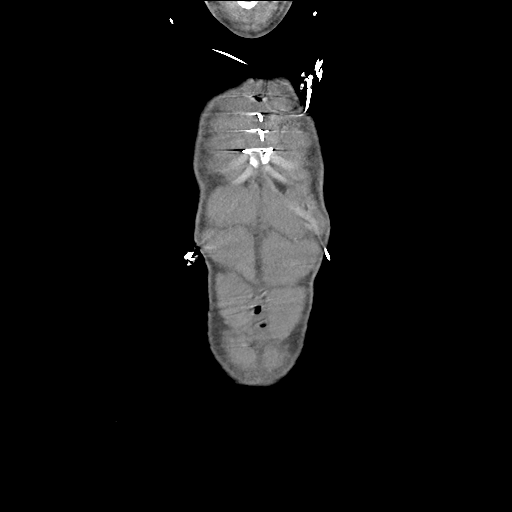
[im 75/149  soft-tissue]
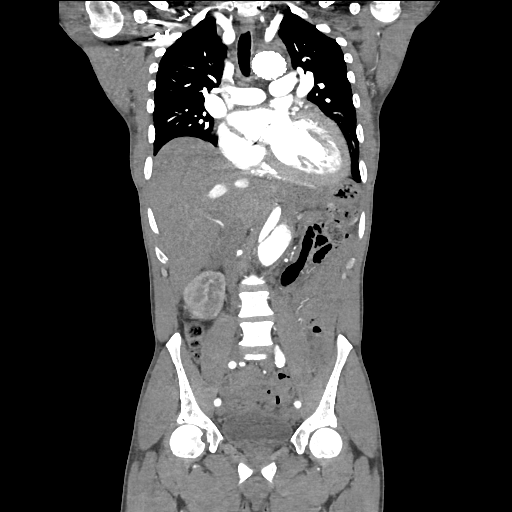
[im 112/149  soft-tissue]
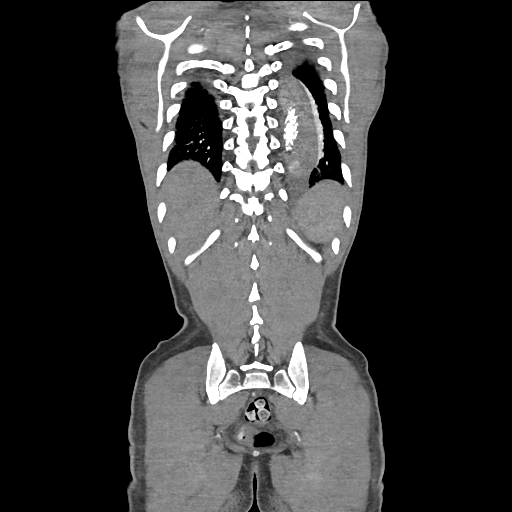

[16 of 46 positions shown; findings below may reference images not displayed]

FINDINGS: CTA CHEST FINDINGS

There is an indwelling stent extends from ascending aorta crosses
the arch to the level of the aortic hiatus. This is stable from the
prior CT. These new new new the aortic arch vessels have been
transplanted to arise from the lower ascending aorta just above the
prosthetic aortic valve. These vessels are widely patent. The lumen
of the thoracic aorta is widely patent. Along the lower descending
thoracic aorta, contrast extends into the false lumen. Less dense
contrast is seen in this portion of the aorta than was present on
the prior exam. The native aorta is dilated. Remainder of the aorta
external to the stent is thrombosed.

NON ANGIOGRAPHIC CT CHEST FINDINGS

Thoracic inlet: No mass or enlarged lymph nodes. Thyroid is
unremarkable.

Mediastinum and hila: Moderate enlargement of the heart with
evidence of left ventricular hypertrophy. There are sub cm shotty
lymph nodes throughout the mediastinum with generalized increased
attenuation throughout the mediastinal fat consistent with edema.
This is similar to the prior study. No mediastinal or hilar masses.

Lungs and pleura: All minimal effusions. Mild dependent atelectasis.
No convincing pneumonia and no evidence of edema. No pneumothorax.

Review of the MIP images confirms the above findings.

CTA ABDOMEN AND PELVIS FINDINGS

The dissection becomes apparent along the lower thoracic descending
aorta into the abdominal aorta. Contrast enhances the false lumen,
less dense than that in the true lumen. The celiac axis, which is
narrowed at its origin, arises from the false lumen. The superior
mesenteric artery and right renal artery arise from the true lumen.
The left renal artery arises from the false lumen. Just below the
origin of the left renal artery, the dissection terminates. Below
this the aorta is widely patent. The inferior mesenteric artery is
widely patent. There is minor calcified plaque along the infrarenal
abdominal aorta. There is dilation of the common iliac artery on the
left to 1.6 cm. Right common iliac artery has a diameter of 1.4 cm.
Partly calcified plaque is noted along the iliac vessels without
significant stenosis. The appearance of the aorta and iliac vessels
is similar to the prior exam.

NON ANGIOGRAPHIC CT ABDOMEN AND PELVIS FINDINGS

Liver, spleen, gallbladder, pancreas are grossly unremarkable. No
adrenal masses.

Kidneys, ureters, bladder: Unremarkable.

Lymph nodes:  No apparent enlarged lymph nodes.

Ascites: Trace free fluid in the pelvis. Generalized increased
attenuation throughout the mesenteric fat. The latter is consistent
with edema.

Abdominal wall: Fluid collections noted in the right inguinal
region, not particularly well-defined, measuring approximately 4.7 x
2.5 cm in greatest transverse dimensions, decreased from 5.8 x
on the prior study.

MUSCULOSKELETAL FINDINGS

No significant abnormality.

Review of the MIP images confirms the above findings.
IMPRESSION: 1. Patient has a known dissection of the aorta. This has been
previously treated with a thoracic aortic stent. The lumen within
the stent is widely patent. Branch vessels of the aortic arch, which
have been transplanted to originate from the lower ascending aorta,
are all widely patent.
2. There is no new dissection.
3. The dissection extends into the abdominal aorta. Some contrast
enhancement extends into the lower thoracic aorta false lumen, less
than was present on the prior study. Vessels in the abdomen arise
from both the true and false lumen as detailed above, unchanged the
prior exam.
4. Moderate cardiomegaly with left ventricular hypertrophy. This is
stable.
5. There is evidence of mild diffuse edema with increased
attenuation in the fat of the mediastinum and in the peritoneal
cavity, with minimal effusions and trace ascites. This is similar to
the prior study.
6. No acute findings in the lungs. No acute findings below the
diaphragm.
7. Right inguinal region fluid collection is smaller than it was
previously, likely postprocedure seroma.

## 2017-09-30 MED ORDER — GI COCKTAIL ~~LOC~~
30.0000 mL | Freq: Once | ORAL | Status: AC
  Administered 2017-10-01: 30 mL via ORAL
  Filled 2017-09-30: qty 30

## 2017-09-30 MED ORDER — LISINOPRIL 10 MG PO TABS
10.0000 mg | ORAL_TABLET | Freq: Once | ORAL | Status: AC
  Administered 2017-10-01: 10 mg via ORAL
  Filled 2017-09-30: qty 1

## 2017-09-30 MED ORDER — LABETALOL HCL 200 MG PO TABS
300.0000 mg | ORAL_TABLET | Freq: Once | ORAL | Status: DC
  Filled 2017-09-30: qty 2

## 2017-09-30 NOTE — ED Triage Notes (Signed)
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 Pt reports sob and nausea as well. Pt has hx of heart cath and loop recorder insertion.

## 2017-10-01 ENCOUNTER — Emergency Department (HOSPITAL_COMMUNITY): Payer: Medicare Other

## 2017-10-01 DIAGNOSIS — R0789 Other chest pain: Secondary | ICD-10-CM | POA: Diagnosis not present

## 2017-10-01 LAB — CUP PACEART REMOTE DEVICE CHECK
Implantable Pulse Generator Implant Date: 20180406
MDC IDC SESS DTM: 20190613020704

## 2017-10-01 LAB — HEPATIC FUNCTION PANEL
ALT: 13 U/L (ref 0–44)
AST: 13 U/L — ABNORMAL LOW (ref 15–41)
Albumin: 3.6 g/dL (ref 3.5–5.0)
Alkaline Phosphatase: 90 U/L (ref 38–126)
BILIRUBIN DIRECT: 0.1 mg/dL (ref 0.0–0.2)
BILIRUBIN INDIRECT: 0.8 mg/dL (ref 0.3–0.9)
BILIRUBIN TOTAL: 0.9 mg/dL (ref 0.3–1.2)
Total Protein: 6.9 g/dL (ref 6.5–8.1)

## 2017-10-01 LAB — I-STAT TROPONIN, ED: TROPONIN I, POC: 0.04 ng/mL (ref 0.00–0.08)

## 2017-10-01 LAB — LIPASE, BLOOD: Lipase: 31 U/L (ref 11–51)

## 2017-10-01 MED ORDER — IOPAMIDOL (ISOVUE-370) INJECTION 76%
100.0000 mL | Freq: Once | INTRAVENOUS | Status: AC | PRN
  Administered 2017-10-01: 100 mL via INTRAVENOUS

## 2017-10-01 MED ORDER — HYDRALAZINE HCL 20 MG/ML IJ SOLN
5.0000 mg | Freq: Once | INTRAMUSCULAR | Status: AC
  Administered 2017-10-01: 5 mg via INTRAVENOUS
  Filled 2017-10-01: qty 1

## 2017-10-01 MED ORDER — IOPAMIDOL (ISOVUE-370) INJECTION 76%
INTRAVENOUS | Status: AC
  Filled 2017-10-01: qty 100

## 2017-10-01 NOTE — Discharge Instructions (Signed)
Your aortic dissection appears to be stable.  There is no evidence of damage to your heart.  Take your medications as prescribed and follow-up with your doctor.  Return to the ED if you develop new or worsening symptoms.

## 2017-10-01 NOTE — ED Provider Notes (Signed)
[facility] EMERGENCY DEPARTMENT Provider Note   CSN: [id number] Arrival date & time: 09/30/17  1819     History   Chief Complaint Chief Complaint  Patient presents with  . Chest Pain    HPI [name] is a 47 y.o. male.  Patient presents with right-sided chest pain that radiates across to his left chest.  This started about 4 days ago and has been constant.  Is worse with twisting of his torso and worse with bending forward or as well as eating. Reports a "hole" sensation in the R side of his chest that spreads to the L.  Patient has a complex medical history including repair of aortic dissection and aortic valve replacement.  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 Patient reports he had chronic chest and abdominal pain since his surgery but this is a new pain in the past several days.  It is associated with some nausea.  It is not pleuritic or exertional.  He is been taking gabapentin at home without relief.  He denies any abdominal pain worse than his chronic pain.  He denies any fever, chills, cough.  He is hypertensive and has not taken his evening blood pressure medication today.  Does not take any anticoagulation.  The history is provided by the patient.  Chest Pain   Associated symptoms include abdominal pain. Pertinent negatives include no dizziness, no headaches, no nausea, no vomiting and no weakness.    Past Medical History:  Diagnosis Date  . Adenomatous colon polyp 08/2015  . Anxiety   . Aortic disease ([place])   . Aortic dissection (HCC)    a. admx 04/2014 >> L renal infarct; a/c  renal failure >> b.  s/p Bioprosthetic Bentall and total arch replacement and staged endovascular repair of descending aortic aneurysm ([facility] - Dr. [name])  . CAD (coronary artery disease)    a. LHC 4/16:  oD1 60%  . Cardiomyopathy ([place])    a. non-ischemic - probably related to untreated HTN and ETOH abuse - Echo 3/13 with EF 35-40% >> b. Echo 4/16: Severe LVH, EF 55-60%, moderate AI, moderate MR, mild LAE, trivial effusion, known type B dissection with communication between true and false lumens with suprasternal images suggesting dissection plane may propagate to at least left subclavian takeoff, root above aortic valve okay    . Chronic abdominal pain   . Chronic combined systolic and diastolic congestive heart failure ([place])    a. 05/2011: Adm with pulm edema/HTN urgency, EF 35-40% with diffuse hypokinesis and moderate to severe mitral regurgitation. Cardiomyopathy likely due to uncontrolled HTN and ETOH abuse - cath deferred due to renal insufficiency (felt due to uncontrolled HTN). bJodie Echevaria MV 06/2011: EF 37% and no ischemia or infarction. c. EF 45-50% by echo 01/2012.  [name] Chronic sinusitis   . CKD (chronic kidney disease)    a. 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  peak creatinine 3.46 during admx for aortic dissection 2/16.  sees Dr [name]  . Colon polyps 09/11/2015   Descending, sigmoid polyps  . DDD (degenerative disc disease), lumbar   . Depression   . Descending thoracic aortic aneurysm ([place])   .  Dissecting aneurysm of thoracic aorta ([place])   . ETOH abuse    a. Reported to have quit 05/2011.  . Frequent headaches   . GERD (gastroesophageal reflux disease)   . Headache(784.0)    "q other day" (08/08/2013)  . Heart murmur   . Hemorrhoid thrombosis   . History of echocardiogram    Echo 1/17:  Severe LVH, EF 55-60%, no RWMA, Gr 2 DD, AVR ok, mild to mod MR, mild LAE, mild reduced RVSF, mod RAE  . History of medication noncompliance   . HYPERLIPIDEMIA   . Hypertension    a. Hx of HTN  urgency secondary to noncompliance. b. urinary metanephrine and catecholeamine levels normal 2013.  c. Renal art [name] 1/16:  No evidence of renal artery stenosis noted bilaterally.  . INGUINAL HERNIA   . Pneumonia ~ 2013  . Renal insufficiency   . Stroke ([place])   . Tobacco abuse   . Valvular heart disease    a. Echo 05/2011: moderate to severe eccentric MR and mild to moderate AI with prolapsing left coronary cusp. b. Echo 01/2012: mild-mod AI, mild dilitation of aortic root, mild MR.;  c. Echo 1/16: Severe LVH consistent with hypertrophic cardio myopathy, EF 50%, no RWMA, mod AI, mild MR, mild RAE, dilated Ao root (40 mm);     Patient Active Problem List   Diagnosis Date Noted  . Somnolence, daytime 04/07/2017  . Snoring 04/07/2017  . Cerebral infarction due to embolism of left middle cerebral artery ([place]) 06/03/2016  . History of aortic dissection 06/03/2016  . Palpitation   . Cerebrovascular accident (CVA) due to embolism of left middle cerebral artery ([place])   . Seizures ([place]) 02/28/2016  . HLD (hyperlipidemia) 08/28/2015  . 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[place]) 06/07/2015  . Cardiomyopathy ([place]) 03/01/2015  . Acid reflux 03/01/2015  . Essential hypertension 03/01/2015  . Cardiac chest pain 12/10/2014  . Abdominal pain 10/12/2014  . S/P aortic dissection repair 10/03/2014  . H/O aortic valve replacement 10/03/2014  . CKD (chronic kidney disease) 05/15/2014  . AKI (acute kidney injury) ([place])   . Aortic dissection, thoracoabdominal ([place])   . Dissecting aneurysm of thoracic aorta, Stanford type B ([facility]) 04/24/2014  . Aortic dissection, thoracic ([place]) 04/24/2014  . Aortic dissection ([place]) 04/23/2014  . Aneurysm of ascending aorta (HCC) 03/28/2014  . Dyspnea 03/27/2014  . ETOH abuse 09/20/2013  . Malignant hypertension 02/20/2013  . Hypertensive urgency 01/27/2012  . Cardiomyopathy, hypertensive ([place]) 01/26/2012  . AI (aortic insufficiency) 01/26/2012  . MR (mitral  regurgitation) 01/26/2012  . Acute renal failure superimposed on stage 3 chronic kidney disease ([place]) 01/26/2012  . Systolic CHF, chronic ([place]) 06/10/2011  . Chronic bronchitis 05/23/2011  . Cardiomegaly - hypertensive 05/22/2011  . Marijuana use 05/22/2011  . Abdominal pain 05/22/2011  . Hypertension, malignant 05/22/2011  . Anxiety and depression 05/22/2011  . Hyperlipidemia LDL goal <70 08/26/2009  . Tobacco use 08/26/2009  . Headache(784.0) 08/26/2009  . Aortic valve disorder 03/11/2009  . INGUINAL HERNIA 02/18/2009  . Uncontrolled hypertension 01/16/2009    Past Surgical History:  Procedure Laterality Date  . ANKLE SURGERY Bilateral    Fractures bilaterally  . AORTIC VALVE SURGERY  09/2014  . FOOT FRACTURE SURGERY Bilateral 2004-2010   "got pins in both of them"  . HEMORRHOID SURGERY N/A 06/15/2015   Procedure: HEMORRHOIDECTOMY;  Surgeon: Stark Klein, MD;  Location: [facility];  Service: General;  Laterality: N/A;  . INGUINAL HERNIA REPAIR Right ~ 1996  . LEFT HEART  CATHETERIZATION WITH CORONARY ANGIOGRAM N/A 06/21/2014   Procedure: LEFT HEART CATHETERIZATION WITH CORONARY ANGIOGRAM;  Surgeon: Larey Dresser, MD;  Location: [facility] CATH LAB;  Service: Cardiovascular;  Laterality: N/A;  . LOOP RECORDER INSERTION N/A 06/19/2016   Procedure: Loop Recorder Insertion;  Surgeon: Deboraha Sprang, MD;  Location: [facility] CV LAB;  Service: Cardiovascular;  Laterality: N/A;  . TEE WITHOUT CARDIOVERSION N/A 03/12/2016   Procedure: TRANSESOPHAGEAL ECHOCARDIOGRAM (TEE);  Surgeon: Sanda Klein, MD;  Location: [facility] ENDOSCOPY;  Service: Cardiovascular;  Laterality: N/A;        Home Medications    Prior to Admission medications   Medication Sig Start Date End Date Taking? Authorizing Provider  amitriptyline (ELAVIL) 25 MG tablet Take 1 tablet (25 mg total) by mouth at bedtime. 06/22/17   Clent Demark, PA-C  amLODipine (NORVASC) 10 MG tablet Take 10 mg by mouth daily.     [provider]  aspirin 81 MG chewable tablet Chew 81 tablets daily by mouth. 01/12/17   [provider]  atorvastatin (LIPITOR) 20 MG tablet Take 1 tablet (20 mg total) by mouth daily. 03/18/16   Hilty, Nadean Corwin, MD  cloNIDine (CATAPRES - DOSED IN MG/24 HR) 0.3 mg/24hr patch Place 1 patch (0.3 mg total) onto the skin once a week. 08/23/17   Evans Lance, MD  gabapentin (NEURONTIN) 300 MG capsule Take 2 capsules (600 mg total) by mouth 3 (three) times daily. 06/22/17   Clent Demark, PA-C  labetalol (NORMODYNE) 300 MG tablet Take 1 tablet (300 mg total) by mouth daily. 06/22/17   Clent Demark, PA-C  lidocaine (XYLOCAINE) 2 % solution Use as directed 5 mLs in the mouth or throat daily as needed. Mouth pain 04/29/17   [provider]  lisinopril (PRINIVIL,ZESTRIL) 10 MG tablet Take 10 mg by mouth daily. 06/19/17   [provider]    Family History Family History  Problem Relation Age of Onset  . Hypertension Mother   . Hypertension Unknown   . Colon cancer Paternal Uncle   . Stroke Maternal Aunt   . Heart attack Brother   . Diabetes Maternal Aunt   . Lung cancer Maternal Uncle   . Throat cancer Neg Hx   . Pancreatic cancer Neg Hx   . Esophageal cancer Neg Hx   . Kidney disease Neg Hx   . Liver disease Neg Hx     Social History Social History   Tobacco Use  . Smoking status: Current Some Day Smoker    Packs/day: 0.25    Years: 23.00    Pack years: 5.75    Types: Cigarettes  . Smokeless tobacco: Never Used  . Tobacco comment: 3 to 4 per day  Substance Use Topics  . Alcohol use: Yes    Alcohol/week: 0.6 oz    Types: 1 Cans of beer per week    Comment: occasionally  . Drug use: Yes    Types: Marijuana    Comment: 2 times/week     Allergies   Imdur [isosorbide nitrate] and Tramadol   Review of Systems Review of Systems  Constitutional: Negative for activity change and appetite change.  HENT: Negative for congestion and sinus pain.   Eyes: Negative  for visual disturbance.  Respiratory: Positive for chest tightness.   Cardiovascular: Positive for chest pain.  Gastrointestinal: Positive for abdominal pain. Negative for nausea and vomiting.  Endocrine: Negative for polyuria.  Genitourinary: Negative for dysuria, hematuria and urgency.  Musculoskeletal: Positive for  arthralgias and myalgias.  Skin: Negative for rash.  Neurological: Negative for dizziness, weakness and headaches.    all other systems are negative except as noted in the HPI and PMH.    Physical Exam Updated Vital Signs BP (!) 179/96   Pulse 73   Temp 99 F (37.2 C) (Oral)   Resp 17   Ht 6' (1.829 m)   Wt 66.7 kg (147 lb)   SpO2 100%   BMI 19.94 kg/m   Physical Exam  Constitutional: He is oriented to person, place, and time. He appears well-developed and well-nourished. No distress.  HENT:  Head: Normocephalic and atraumatic.  Mouth/Throat: Oropharynx is clear and moist. No oropharyngeal exudate.  Eyes: Pupils are equal, round, and reactive to light. Conjunctivae and EOM are normal.  Neck: Normal range of motion. Neck supple.  No meningismus.  Cardiovascular: Normal rate, regular rhythm and intact distal pulses.  Murmur heard. 3/6 holosystolic murmur Equal radial, femoral, DP and PT pulses bilaterally  Well healed surgical scars  Pulmonary/Chest: Effort normal and breath sounds normal. No respiratory distress. He exhibits no tenderness.  Abdominal: Soft. There is no tenderness. There is no rebound and no guarding.  Well healed surgical scars  Musculoskeletal: Normal range of motion. He exhibits no edema or tenderness.  Neurological: He is alert and oriented to person, place, and time. No cranial nerve deficit. He exhibits normal muscle tone. Coordination normal.  No ataxia on finger to nose bilaterally. No pronator drift. 5/5 strength throughout. CN 2-12 intact.Equal grip strength. Sensation intact.   Skin: Skin is warm.  Psychiatric: He has a normal  mood and affect. His behavior is normal.  Nursing note and vitals reviewed.    ED Treatments / Results  Labs (all labs ordered are listed, but only abnormal results are displayed) Labs Reviewed  BASIC METABOLIC PANEL - Abnormal; Notable for the following components:      Result Value   Glucose, Bld 113 (*)    Creatinine, Ser 1.85 (*)    GFR calc non Af Amer 42 (*)    GFR calc Af Amer 49 (*)    All other components within normal limits  CBC - Abnormal; Notable for the following components:   RBC 4.12 (*)    Hemoglobin 12.8 (*)    Platelets 86 (*)    All other components within normal limits  HEPATIC FUNCTION PANEL  LIPASE, BLOOD  I-STAT TROPONIN, ED  I-STAT TROPONIN, ED    EKG EKG Interpretation  Date/Time:  Thursday September 30 2017 18:23:34 EDT Ventricular Rate:  83 PR Interval:  150 QRS Duration: 84 QT Interval:  382 QTC Calculation: 448 R Axis:   -7 Text Interpretation:  Normal sinus rhythm Biatrial enlargement Left ventricular hypertrophy with repolarization abnormality Abnormal ECG no sig change from previous Confirmed by Charlesetta Shanks [phone]) on 09/30/2017 6:29:39 PM   Radiology Dg Chest 2 View  Result Date: 09/30/2017 CLINICAL DATA:  Chest pain EXAM: CHEST - 2 VIEW COMPARISON:  05/19/2017 FINDINGS: Stent graft repair of the thoracic aorta. Prior median sternotomy, CABG and valve replacement. Mild cardiomegaly. No confluent airspace opacities or effusions. No acute bony abnormality. IMPRESSION: No active cardiopulmonary disease. Electronically Signed   By: Rolm Baptise M.D.   On: 09/30/2017 19:15   Ct Angio Chest/abd/pel For Dissection W And/or Wo Contrast  Result Date: 10/01/2017 CLINICAL DATA:  Generalized chest pain starting 5-6 days ago radiating to the left abdomen near surgical incision from previous aneurysm 2 years ago. EXAM: CT  ANGIOGRAPHY CHEST, ABDOMEN AND PELVIS TECHNIQUE: Multidetector CT imaging through the chest, abdomen and pelvis was performed using  the standard protocol during bolus administration of intravenous contrast. Multiplanar reconstructed images and MIPs were obtained and reviewed to evaluate the vascular anatomy. CONTRAST:  146mL ISOVUE-370 IOPAMIDOL (ISOVUE-370) INJECTION 76% COMPARISON:  11/10/2015 CT angiography of the chest, abdomen and pelvis FINDINGS: CTA CHEST FINDINGS Cardiovascular: Cardiomegaly is redemonstrated without pericardial effusion. The patient is status post aortic valve replacement. Biatrial enlargement, stable in appearance with left ventricular hypertrophy. Non dilated right ventricle. Right arm injection was performed. The SVC is patent without occlusion or thrombosis. Trifurcation off the ascending aorta is again noted, patent in appearance and supplying the great vessels. A stent graft from the mid ascending thoracic aorta through descending aorta is stable and patent with interval decrease in size of the native descending thoracic aortic aneurysm and dissection, previously 4.3 cm and now 3.3 cm. Mediastinum/Nodes: Patent trachea and mainstem bronchi. Esophagus is unremarkable. No thyromegaly or mass. No adenopathy. No evidence of mediastinal hemorrhage or hematoma. Lungs/Pleura: Mild centrilobular emphysema at the apices. Dependent passive atelectasis at the lung bases. Subpleural scarring is noted in the lingula and left lower lobe. No effusion or pneumothorax. Musculoskeletal: Median sternotomy. Negative for aggressive osseous lesions or fracture. Loop recording device along the anterior left chest wall. Review of the MIP images confirms the above findings. CTA ABDOMEN AND PELVIS FINDINGS VASCULAR Aorta: The false lumen is no longer opacified nor fills with contrast. The upper abdominal aortic aneurysm on my measurement is approximately 4.4 cm. True lumen now feet the celiac axis, SMA and both renal arteries. Celiac: Slightly ectatic at its origin measuring 12 mm in caliber. No stenosis or dissection. SMA: Patent without  significant stenosis. Renals: Left renal artery stent is noted. Single renal arteries are noted to both kidneys without significant stenosis or aneurysm. IMA: Patent Inflow: Ectasia of the common iliac arteries, on the left measuring 16 mm and on the right 13 mm. Mild scattered atherosclerotic plaque of both external iliac arteries. Mild plaque noted in the proximal left internal iliac artery. Veins: No obvious venous abnormality within the limitations of this arterial phase study. Review of the MIP images confirms the above findings. NON-VASCULAR Hepatobiliary: No enhancing mass or biliary dilatation. Gallbladder is free of stones. Pancreas: Negative Spleen: Negative Adrenals/Urinary Tract: Symmetric cortical enhancement of both kidneys. No enhancing renal masses nor obstructive uropathy. Normal bilateral adrenal glands. The urinary bladder is unremarkable for the degree of distention. Stomach/Bowel: Assessment somewhat limited due to lack of intra-abdominal fat. Moderate stool retention is seen within colon. Nonobstructed, nondistended small bowel. Appendix is normal. Nondistended stomach. Lymphatic: No adenopathy. Reproductive: Mild prostatic enlargement with central zone calcifications. Other: No ascites or free air. Musculoskeletal: No aggressive osseous lesions. Review of the MIP images confirms the above findings. IMPRESSION: 1. No acute pulmonary embolus.  No active pulmonary disease. 2. Stable cardiomegaly with biatrial enlargement and left ventricular hypertrophy. 3. Stable patent ascending aortic bypass graft feeding the great vessels with patent thoracic aortic stent graft. 4. Previous filling of the false lumen of the distal descending thoracic aortic dissection is no longer present. Smaller native descending thoracic aortic aneurysm surrounding the stent graft since prior. 5. Patent branch vessels off the abdominal aorta. 6. No acute solid nor hollow visceral organ abnormality. Electronically Signed    By: Ashley Royalty M.D.   On: 10/01/2017 01:59    Procedures Procedures (including critical care time)  Medications Ordered in ED  Medications  gi cocktail (Maalox,Lidocaine,Donnatal) (has no administration in time range)  labetalol (NORMODYNE) tablet 300 mg (has no administration in time range)  lisinopril (PRINIVIL,ZESTRIL) tablet 10 mg (has no administration in time range)     Initial Impression / Assessment and Plan / ED Course  I have reviewed the triage vital signs and the nursing notes.  Pertinent labs & imaging results that were available during my care of the patient were reviewed by me and considered in my medical decision making (see chart for details).    Patient with complex medical history including aortic and abdominal aneurysm repair and aortic valve replacement presenting with 5 days of constant chest pain that is worse with twisting of his torso and palpation and bending forward.  EKG shows unchanged lateral ST depressions and T wave inversions consistent with LVH.  Chest pain appears atypical for ACS.  He will be given his home blood pressure medications, will obtain CTA to evaluate for any acute aortic pathology.  CT scan shows stable aortic pathology.  Discussed with Dr. Randel Pigg. False lumen is no longer filling and. Native dissection pathology appears smaller. No pulmonary embolism.   Troponin negative x2.  Low suspicion for ACS.  Pain is atypical and worse with bending forward and twisting of his torso.  Patient given his home blood pressure medication. BP improving.  He is anxious to leave and agrees to follow-up with his [employer] and primary doctor this week. Return precautions discussed.  Final Clinical Impressions(s) / ED Diagnoses   Final diagnoses:  Atypical chest pain    ED Discharge Orders    None       Talissa Apple, Annie Main, MD 10/01/17 [phone]

## 2017-10-04 NOTE — Progress Notes (Signed)
[facility]  PATIENT: [name] DOB: Jul 04, 1970   REASON FOR VISIT: Follow-up for stroke 02/2016 HISTORY FROM: Patient    HISTORY OF PRESENT ILLNESS:Mr.[name] a 47 y.o.malewith history of aortic dissection s/p repair and reconstruction, systolic and diastolic heart failure, and HTN admitted on 02/28/16 for confusion and right-sided weakness. In ED, had seizure and subsequent cardiac arrest. CT negative for bleeding. CTA without clear LVO, but moderate narrowing of left M2 and right P2. Hewas not considered for IV TPA due to seizure at onset with non-stroke presentation. Intubated and started on Keppra. Post extubation had mild right hemiparesis and word finding difficulty. MRI showed left MCA embolic stroke. EF 55-60%. Negative DVT. LDL 124 and A1C 5.3. Cardiology recommended outpt TEE and 30 day cardiac event monitoring. If monitoring negative, will consider loop recorder. He was discharged with ASA and lipitor without neuro deficit.  3/20/18DR. Xu follow up - the patient has been doing well. No recurrent stroke symptoms. Had 30 day cardiac event monitoring did not show afib. Had outpt TEE which was unremarkable. He was scheduled with Dr. Caryl Comes for loop recorder. Pt still feels intermittent palpitation. Cut down smoking but not quit yet.  Interval History7/23/18 Dr. Erlinda Hong During the interval time, pt has been doing well from stroke standpoint. He follows with Dr. [name] in [facility] and repeat TTE with EF 50%. Bp goal 120/80 due to hx of aortic dissection s/p repair. He continues on Keppra and no more seizure episode. Back to driving earlier this month. Has not quit smoking yet. BP still high 162/91. He said his BP at home similar to this. He had loop recorder with Dr. Caryl Comes and currently no Afib.  UPDATE 1/23/2019CM Mr. [name], 46 year old male returns for follow-up for stroke event in December 2017.  He has been doing well from a stroke standpoint.  He had aortic  aneurysm repair at [facility] in October.  He remains on aspirin and Lipitor for secondary stroke prevention.  He has no bruising and no bleeding.  In addition he denies myalgias.  Blood pressure in the office today 141/80.  He continues to have adjustments in his medication by Dr. Harlow Mares.  He has another appointment tomorrow.  He remains on Keppra 500 twice daily for seizure  prevention without further seizure activity.  He denies side effects to the medication.  He complains of intermittent visual difficulty particularly blurring of vision.  He says he has never had an eye exam.  Loop recorder so far no atrial fibrillation.  Continues to smoke and was encouraged to quit.  Complains of daytime drowsiness memory loss and snoring and depression.  He has never had a sleep study.  He is also trying to get disability.  He returns for reevaluation UPDATE 7/23/2019CM Mr. [name], 47 year old male returns for follow-up with history of stroke event in December 2017.  He also had an isolated seizure event at that time and  cardiac arrest.  He remains on aspirin for secondary stroke prevention without bruising or bleeding and no further stroke or TIA symptoms.  In addition he is on Lipitor without myalgias.  Blood pressure continues to be elevated at 180/90.  He is compliant with his for blood pressure medications he claims.  He was advised to see Dr. Harlow Mares who manages his hypertension.  He is off Keppra now without further seizure events.  He recently got glasses but unfortunately they fell off the car and were broken.  Loop recorder so far no atrial  fibrillation.  Continues to smoke 2 cigarettes a day was encouraged to quit.  Sleep study after last visit was negative for obstructive sleep apnea.  He returns for reevaluation  REVIEW OF SYSTEMS: Full 14 system review of systems performed and notable only for those listed, all others are neg:  Constitutional: neg  Cardiovascular: neg Ear/Nose/Throat: Hearing loss Skin:  neg Eyes: Blurry vision Respiratory: neg Gastroitestinal: neg  Hematology/Lymphatic: neg  Endocrine: Intolerance to heat Musculoskeletal:neg Allergy/Immunology: neg Neurological: Memory loss, history of isolated seizure  Psychiatric: Depression  Sleep : neg  ALLERGIES: Allergies  Allergen Reactions  . Imdur [Isosorbide Nitrate] Other (See Comments)    headache  . Tramadol Other (See Comments)    "headaches" per pt    HOME MEDICATIONS: Outpatient Medications Prior to Visit  Medication Sig Dispense Refill  . amitriptyline (ELAVIL) 25 MG tablet Take 1 tablet (25 mg total) by mouth at bedtime. 30 tablet 2  . amLODipine (NORVASC) 10 MG tablet Take 10 mg by mouth daily.     [name] aspirin 81 MG chewable tablet Chew 81 tablets daily by mouth.    [name] atorvastatin (LIPITOR) 20 MG tablet Take 1 tablet (20 mg total) by mouth daily. 30 tablet 0  . cloNIDine (CATAPRES - DOSED IN MG/24 HR) 0.3 mg/24hr patch Place 1 patch (0.3 mg total) onto the skin once a week. 4 patch 6  . gabapentin (NEURONTIN) 300 MG capsule Take 2 capsules (600 mg total) by mouth 3 (three) times daily. 180 capsule 2  . labetalol (NORMODYNE) 300 MG tablet Take 1 tablet (300 mg total) by mouth daily. 30 tablet 5  . lidocaine (LIDODERM) 5 % apply 1 patch to the affected area daily. Leave on for 12 hours and then off for 12 hours.    . lidocaine (XYLOCAINE) 2 % solution Use as directed 5 mLs in the mouth or throat daily as needed. Mouth pain  0  . lisinopril (PRINIVIL,ZESTRIL) 30 MG tablet 30 mg daily.  6  . lisinopril (PRINIVIL,ZESTRIL) 10 MG tablet Take 10 mg by mouth daily.     No facility-administered medications prior to visit.     PAST MEDICAL HISTORY: Past Medical History:  Diagnosis Date  . Adenomatous colon polyp 08/2015  . Anxiety   . Aortic disease ([place])   . Aortic dissection ([facility])    a. admx 04/2014 >> L renal infarct; a/c renal failure >> b.  s/p Bioprosthetic [place] and total arch replacement and staged  endovascular repair of descending aortic aneurysm ([facility] - Dr. [name])  . CAD (coronary artery disease)    a. LHC 4/16:  oD1 60%  . Cardiomyopathy ([place])    a. non-ischemic - probably related to untreated HTN and ETOH abuse - Echo 3/13 with EF 35-40% >> b. Echo 4/16: Severe LVH, EF 55-60%, moderate AI, moderate MR, mild LAE, trivial effusion, known type B dissection with communication between true and false lumens with suprasternal images suggesting dissection plane may propagate to at least left subclavian takeoff, root above aortic valve okay    . Chronic abdominal pain   . Chronic combined systolic and diastolic congestive heart failure ([place])    a. 05/2011: Adm with pulm edema/HTN urgency, EF 35-40% with diffuse hypokinesis and moderate to severe mitral regurgitation. Cardiomyopathy likely due to uncontrolled HTN and ETOH abuse - cath deferred due to renal insufficiency (felt due to uncontrolled HTN). bJodie Echevaria MV 06/2011: EF 37% and no ischemia or infarction. c. EF 45-50% by echo 01/2012.  [name] Chronic  sinusitis   . CKD (chronic kidney disease)    a. 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  peak creatinine 3.46 during admx for aortic dissection 2/16.  sees Dr [name]  . Colon polyps 09/11/2015   Descending, sigmoid polyps  . DDD (degenerative disc disease), lumbar   . Depression   . Descending thoracic aortic aneurysm ([place])   . Dissecting aneurysm of thoracic aorta ([place])   . ETOH abuse    a. Reported to have quit 05/2011.  . Frequent headaches   . GERD (gastroesophageal reflux disease)   . Headache(784.0)    "q other day" (08/08/2013)  . Heart murmur   . Hemorrhoid thrombosis   . History of echocardiogram    Echo 1/17:  Severe LVH, EF 55-60%, no RWMA, Gr 2 DD, AVR ok, mild to mod MR, mild LAE, mild reduced RVSF, mod RAE  . History of medication noncompliance   . HYPERLIPIDEMIA   . Hypertension    a. Hx of HTN urgency secondary to noncompliance. b. urinary metanephrine and catecholeamine levels  normal 2013.  c. Renal art [name] 1/16:  No evidence of renal artery stenosis noted bilaterally.  . INGUINAL HERNIA   . Pneumonia ~ 2013  . Renal insufficiency   . Stroke ([place])   . Tobacco abuse   . Valvular heart disease    a. Echo 05/2011: moderate to severe eccentric MR and mild to moderate AI with prolapsing left coronary cusp. b. Echo 01/2012: mild-mod AI, mild dilitation of aortic root, mild MR.;  c. Echo 1/16: Severe LVH consistent with hypertrophic cardio myopathy, EF 50%, no RWMA, mod AI, mild MR, mild RAE, dilated Ao root (40 mm);     PAST SURGICAL HISTORY: Past Surgical History:  Procedure Laterality Date  . ANKLE SURGERY Bilateral    Fractures bilaterally  . AORTIC VALVE SURGERY  09/2014  . FOOT FRACTURE SURGERY Bilateral 2004-2010   "got pins in both of them"  . HEMORRHOID SURGERY N/A 06/15/2015   Procedure: HEMORRHOIDECTOMY;  Surgeon: Stark Klein, MD;  Location: [place];  Service: General;  Laterality: N/A;  . INGUINAL HERNIA REPAIR Right ~ 1996  . LEFT HEART CATHETERIZATION WITH CORONARY ANGIOGRAM N/A 06/21/2014   Procedure: LEFT HEART CATHETERIZATION WITH CORONARY ANGIOGRAM;  Surgeon: Larey Dresser, MD;  [facility] CATH LAB;  Service: Cardiovascular;  Laterality: N/A;  . LOOP RECORDER INSERTION N/A 06/19/2016   Procedure: Loop Recorder Insertion;  Surgeon: Deboraha Sprang, MD;  Location: [place] CV LAB;  Service: Cardiovascular;  Laterality: N/A;  . TEE WITHOUT CARDIOVERSION N/A 03/12/2016   Procedure: TRANSESOPHAGEAL ECHOCARDIOGRAM (TEE);  Surgeon: Sanda Klein, MD;  Location: [facility] ENDOSCOPY;  Service: Cardiovascular;  Laterality: N/A;    FAMILY HISTORY: Family History  Problem Relation Age of Onset  . Hypertension Mother   . Hypertension Unknown   . Colon cancer Paternal Uncle   . Stroke Maternal Aunt   . Heart attack Brother   . Diabetes Maternal Aunt   . Lung cancer Maternal Uncle   . Other Sister        "breathing machine at night"  . Headache Sister   .  Thyroid disease Sister   . Throat cancer Neg Hx   . Pancreatic cancer Neg Hx   . Esophageal cancer Neg Hx   . Kidney disease Neg Hx   . Liver disease Neg Hx     SOCIAL HISTORY: Social History   Socioeconomic History  . Marital status: Married    Spouse name: Not on  file  . Number of children: 5  . Years of education: 16  . Highest education level: Not on file  Occupational History  . Occupation: Disabled, part-time Programmer, systems    Comment: Disability  Social Needs  . Financial resource strain: Not on file  . Food insecurity:    Worry: Not on file    Inability: Not on file  . Transportation needs:    Medical: Not on file    Non-medical: Not on file  Tobacco Use  . Smoking status: Current Some Day Smoker    Packs/day: 0.25    Years: 23.00    Pack years: 5.75    Types: Cigarettes  . Smokeless tobacco: Never Used  . Tobacco comment: 3 to 4 per day  Substance and Sexual Activity  . Alcohol use: Yes    Alcohol/week: 0.6 oz    Types: 1 Cans of beer per week    Comment: occasionally  . Drug use: Yes    Types: Marijuana    Comment: 2 times/week  . Sexual activity: Yes  Lifestyle  . Physical activity:    Days per week: Not on file    Minutes per session: Not on file  . Stress: Not on file  Relationships  . Social connections:    Talks on phone: Not on file    Gets together: Not on file    Attends religious service: Not on file    Active member of club or organization: Not on file    Attends meetings of clubs or organizations: Not on file    Relationship status: Not on file  . Intimate partner violence:    Fear of current or ex partner: Not on file    Emotionally abused: Not on file    Physically abused: Not on file    Forced sexual activity: Not on file  Other Topics Concern  . Not on file  Social History Narrative   Fun: Enjoy his children     PHYSICAL EXAM  Vitals:   10/05/17 1102 10/05/17 1112  BP: (!) 199/103 (!) 180/90  Pulse: 72 71  Weight:  149 lb 6.4 oz (67.8 kg)   Height: 6' (1.829 m)    Body mass index is 20.26 kg/m.  Generalized: Well developed, in no acute distress  Head: normocephalic and atraumatic,. Oropharynx benign  Neck: Supple, no carotid bruits  Cardiac: Regular rate rhythm, murmur audible Musculoskeletal: No deformity   Neurological examination   Mentation: Alert oriented to time, place, history taking. Attention span and concentration appropriate. Recent and remote memory intact.  Follows all commands speech and language fluent.   Cranial nerve II-XII: Pupils were equal round reactive to light extraocular movements were full, visual field were full on confrontational test. Facial sensation and strength were normal. hearing was intact to finger rubbing bilaterally. Uvula tongue midline. head turning and shoulder shrug were normal and symmetric.Tongue protrusion into cheek strength was normal. Motor: normal bulk and tone, full strength in the BUE, BLE, fine finger movements normal, no pronator drift. No focal weakness Sensory: normal and symmetric to light touch, pinprick, and  Vibration,  Coordination: finger-nose-finger, heel-to-shin bilaterally, no dysmetria, no tremor Reflexes: 1+ upper lower and symmetric plantar responses were flexor bilaterally. Gait and Station: Rising up from seated position without assistance, normal stance,  moderate stride, good arm swing, smooth turning, able to perform tiptoe, and heel walking without difficulty. Tandem gait is mildly unsteady.   DIAGNOSTIC DATA (LABS, IMAGING, TESTING) - I reviewed patient records,  labs, notes, testing and imaging myself where available.  Lab Results  Component Value Date   WBC 6.6 09/30/2017   HGB 12.8 (L) 09/30/2017   HCT 40.1 09/30/2017   MCV 97.3 09/30/2017   PLT 86 (L) 09/30/2017      Component Value Date/Time   NA 141 09/30/2017 1827   NA 141 06/22/2017 1220   K 4.2 09/30/2017 1827   CL 109 09/30/2017 1827   CO2 26 09/30/2017  1827   GLUCOSE 113 (H) 09/30/2017 1827   BUN 16 09/30/2017 1827   BUN 20 06/22/2017 1220   CREATININE 1.85 (H) 09/30/2017 1827   CREATININE 1.52 (H) 06/08/2014 1132   CALCIUM 9.2 09/30/2017 1827   PROT 6.9 10/01/2017 0026   PROT 7.2 06/22/2017 1220   ALBUMIN 3.6 10/01/2017 0026   ALBUMIN 4.1 06/22/2017 1220   AST 13 (L) 10/01/2017 0026   ALT 13 10/01/2017 0026   ALKPHOS 90 10/01/2017 0026   BILITOT 0.9 10/01/2017 0026   BILITOT 0.2 06/22/2017 1220   GFRNONAA 42 (L) 09/30/2017 1827   GFRAA 49 (L) 09/30/2017 1827   Lab Results  Component Value Date   CHOL 178 03/04/2016   HDL 30 (L) 03/04/2016   LDLCALC 124 (H) 03/04/2016   TRIG 119 03/04/2016   CHOLHDL 5.9 03/04/2016   Lab Results  Component Value Date   HGBA1C 5.3 03/04/2016      ASSESSMENT AND PLAN 47 y.o. African American male with PMH of aortic dissection s/p repair and reconstruction, systolic and diastolic heart failure, and HTN admitted on 02/28/16 for confusion and right-sided weakness. In ED, had seizure and subsequent cardiac arrest. CT negative for bleeding. CTA without clear LVO, but moderate narrowing of left M2 and right P2. MRI showed left MCA embolic stroke. EF 55-60%. Negative DVT. LDL 124 and A1C 5.3. He was discharged with ASA and lipitor and keppra without neuro deficit. Had 30 day cardiac event monitoring did not show afib. Had outpt TEE which was unremarkable. He had loop recorder placed with Dr. Caryl Comes and so far no afib. Cut down smoking but not quit yet. No more seizure now and back to driving.  He has been titrated off of Keppra.  BP not in good control, goal 120/80 due to hx of aortic dissection s/p repair.   Sleep study negative for obstructive sleep apnea. The patient is a current patient of Dr. Erlinda Hong  who has left our practice.  This note is sent to the work in doctor.      Plan: Stressed the importance of management of risk factors to prevent further stroke Continue aspirin and Lipitor for secondary  stroke prevention Stop smoking, down to 2 daily Loop recorder so far no atrial fibrillation Check BP at home and record and bring over or call Dr. Harlow Mares for medication adjustment. BP goal 120/80 due to hx of aortic dissection s/p repair today's reading 180/90  continue antihypertensive medications(4) Follow up with your primary care physician for stroke risk factor modification. Recommend maintain blood pressure goal 120/80, diabetes with hemoglobin A1c goal below 7.0% and lipids with LDL cholesterol goal below 70 mg/dL.   exercise by walking,   eat healthy diet with whole grains,  fresh fruits and vegetables Continue follow up with Dr. [name] in [place] Please maintain seizure precautions,pt has tapered off Keppra sleep study no OSA Follow-up in 1 year if continued stable will discharge  Dennie Bible, [facility], [facility], APRN  [facility] [address], [place],  [place] [address] [phone]

## 2017-10-05 ENCOUNTER — Encounter: Payer: Self-pay | Admitting: Nurse Practitioner

## 2017-10-05 ENCOUNTER — Ambulatory Visit (INDEPENDENT_AMBULATORY_CARE_PROVIDER_SITE_OTHER): Payer: Medicare Other | Admitting: Nurse Practitioner

## 2017-10-05 VITALS — BP 180/90 | HR 71 | Ht 72.0 in | Wt 149.4 lb

## 2017-10-05 DIAGNOSIS — Z8673 Personal history of transient ischemic attack (TIA), and cerebral infarction without residual deficits: Secondary | ICD-10-CM | POA: Diagnosis not present

## 2017-10-05 DIAGNOSIS — Z720 Tobacco use: Secondary | ICD-10-CM

## 2017-10-05 DIAGNOSIS — I639 Cerebral infarction, unspecified: Secondary | ICD-10-CM | POA: Diagnosis not present

## 2017-10-05 DIAGNOSIS — I10 Essential (primary) hypertension: Secondary | ICD-10-CM

## 2017-10-05 DIAGNOSIS — E785 Hyperlipidemia, unspecified: Secondary | ICD-10-CM | POA: Diagnosis not present

## 2017-10-05 NOTE — Patient Instructions (Signed)
Stressed the importance of management of risk factors to prevent further stroke Continue aspirin and Lipitor for secondary stroke prevention Stop smoking, down to 2 daily Loop recorder so far no atrial fibrillation Check BP at home and record and bring over or call Dr. Harlow Mares for medication adjustment. BP goal 120/80 due to hx of aortic dissection s/p repair today's reading 180/90  continue antihypertensive medications(4) Follow up with your primary care physician for stroke risk factor modification. Recommend maintain blood pressure goal 120/80, diabetes with hemoglobin A1c goal below 7.0% and lipids with LDL cholesterol goal below 70 mg/dL.   exercise by walking,   eat healthy diet with whole grains,  fresh fruits and vegetables Continue follow up with Dr. [name] in [place] Please maintain seizure precautions,pt has tapered off Keppra sleep study no OSA Follow-up in 1 year

## 2017-10-21 DIAGNOSIS — I10 Essential (primary) hypertension: Secondary | ICD-10-CM | POA: Diagnosis not present

## 2017-10-21 DIAGNOSIS — Z952 Presence of prosthetic heart valve: Secondary | ICD-10-CM | POA: Diagnosis not present

## 2017-10-21 DIAGNOSIS — I639 Cerebral infarction, unspecified: Secondary | ICD-10-CM | POA: Diagnosis not present

## 2017-10-21 DIAGNOSIS — Z8679 Personal history of other diseases of the circulatory system: Secondary | ICD-10-CM | POA: Diagnosis not present

## 2017-10-21 DIAGNOSIS — Z720 Tobacco use: Secondary | ICD-10-CM | POA: Diagnosis not present

## 2017-10-21 DIAGNOSIS — N183 Chronic kidney disease, stage 3 (moderate): Secondary | ICD-10-CM | POA: Diagnosis not present

## 2017-10-21 DIAGNOSIS — N280 Ischemia and infarction of kidney: Secondary | ICD-10-CM | POA: Diagnosis not present

## 2017-10-21 DIAGNOSIS — I716 Thoracoabdominal aortic aneurysm, without rupture: Secondary | ICD-10-CM | POA: Diagnosis not present

## 2017-10-21 DIAGNOSIS — Z9889 Other specified postprocedural states: Secondary | ICD-10-CM | POA: Diagnosis not present

## 2017-10-31 IMAGING — CT CT CTA ABD/PEL W/CM AND/OR W/O CM
2 of 6 series · 12 of 46 positions shown, 14 images · IV contrast (omnipaque)
Comparison: CTA CAP 02/02/2015; 01/02/2015.

CLINICAL DATA: Patient with history of dissection status post
repair. Patient presents with acute chest pain.

EXAM:
CT ANGIOGRAPHY CHEST, ABDOMEN AND PELVIS
TECHNIQUE: Multidetector CT imaging through the chest, abdomen and pelvis was
performed using the standard protocol during bolus administration of
intravenous contrast. Multiplanar reconstructed images and MIPs were
obtained and reviewed to evaluate the vascular anatomy.
CONTRAST:  100mL OMNIPAQUE IOHEXOL 350 MG/ML SOLN

[Series 7: dissection 2mm · axial · 0.63mm/px · z∈[+855,+1415]mm · 9 of 332 slices shown, 11 images]
[im 26/332  soft-tissue]
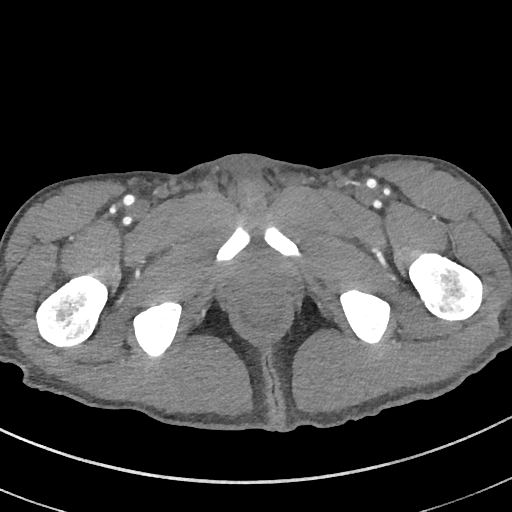
[im 26/332  bone]
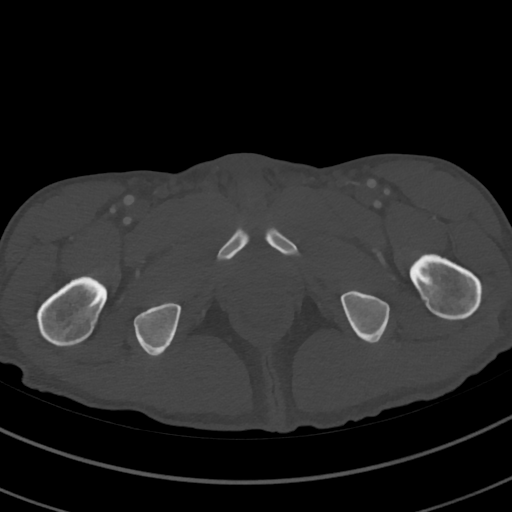
[im 64/332  soft-tissue]
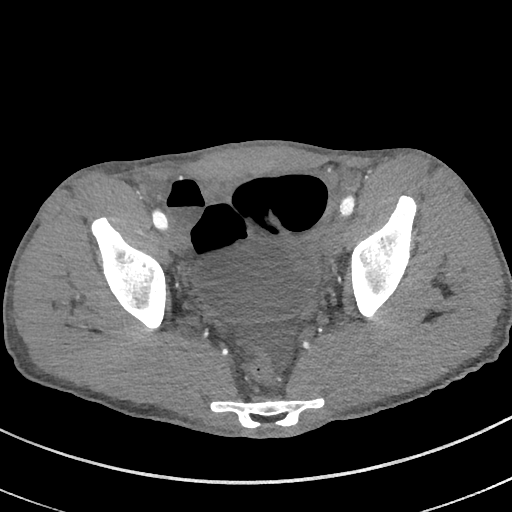
[im 90/332  soft-tissue]
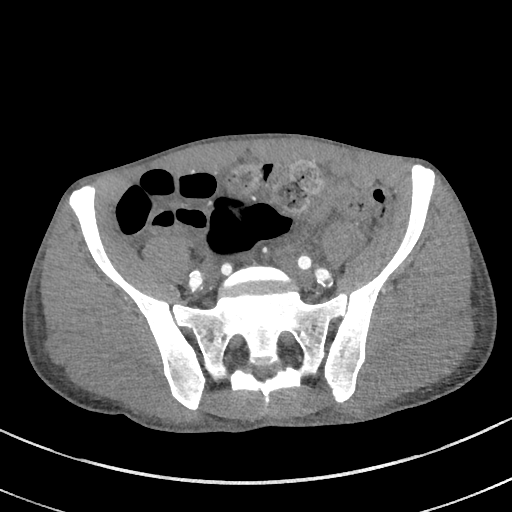
[im 128/332  soft-tissue]
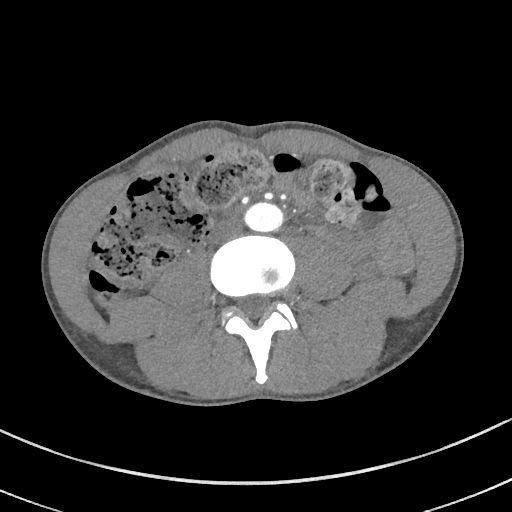
[im 166/332  soft-tissue]
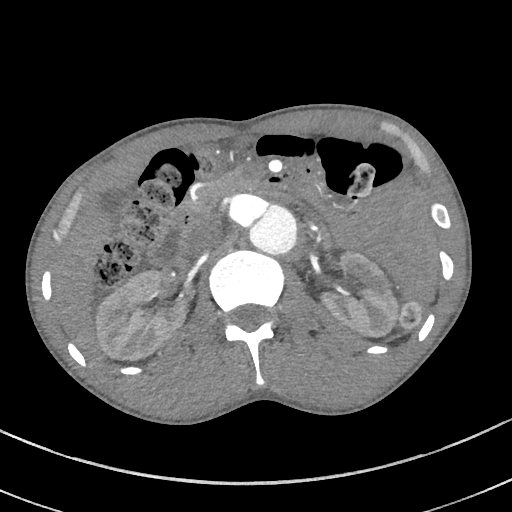
[im 204/332  soft-tissue]
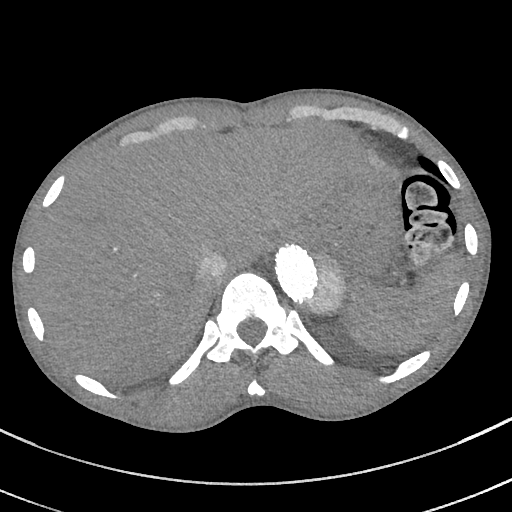
[im 242/332  soft-tissue]
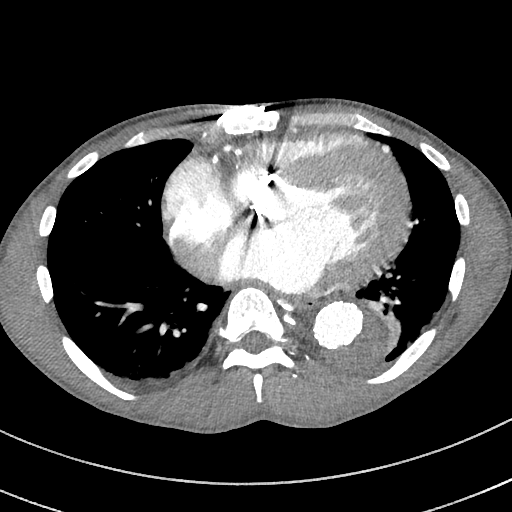
[im 268/332  soft-tissue]
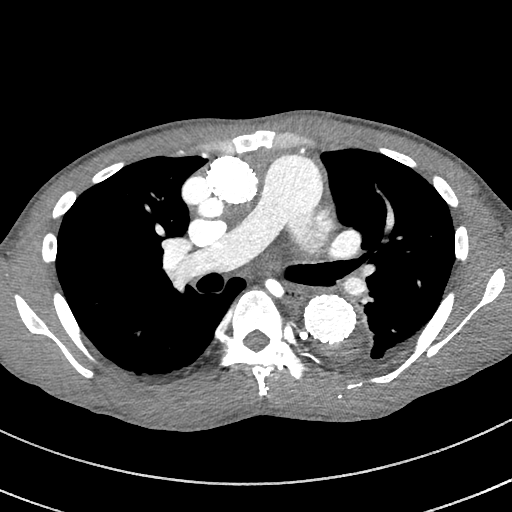
[im 306/332  soft-tissue]
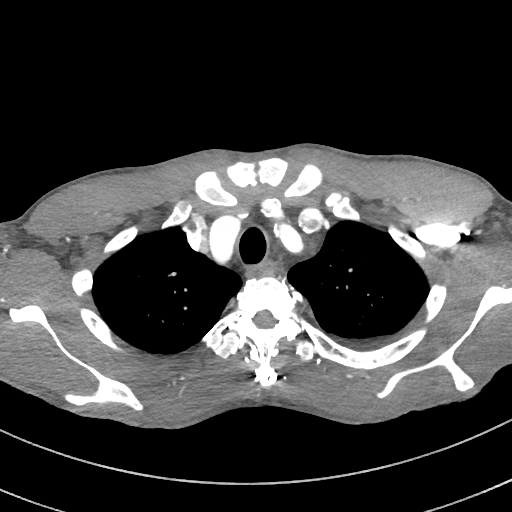
[im 306/332  bone]
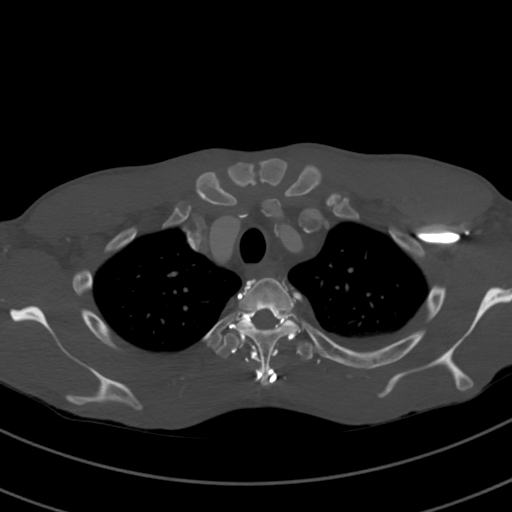

[Series 9: dissection 2mm cor · coronal · 0.65mm/px · 3 of 95 slices shown]
[im 24/95  soft-tissue]
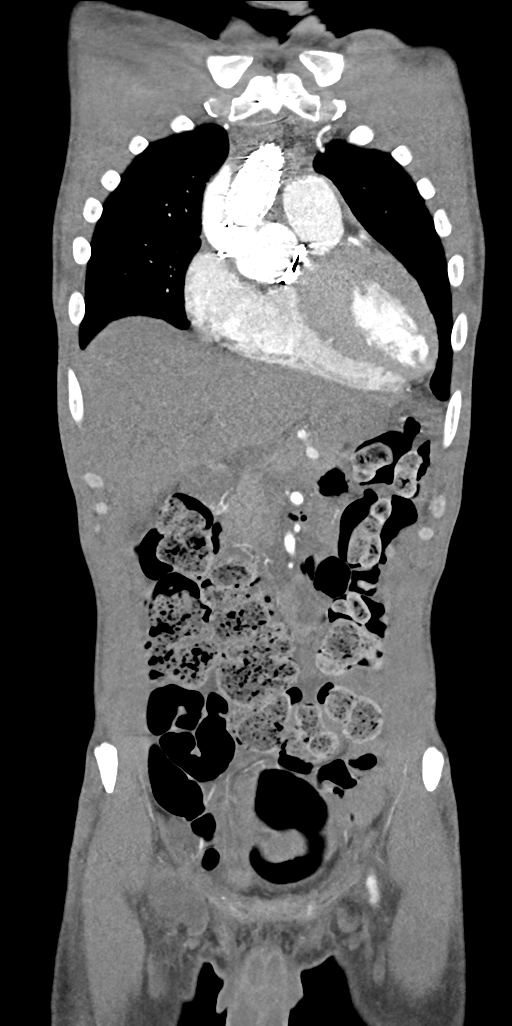
[im 48/95  soft-tissue]
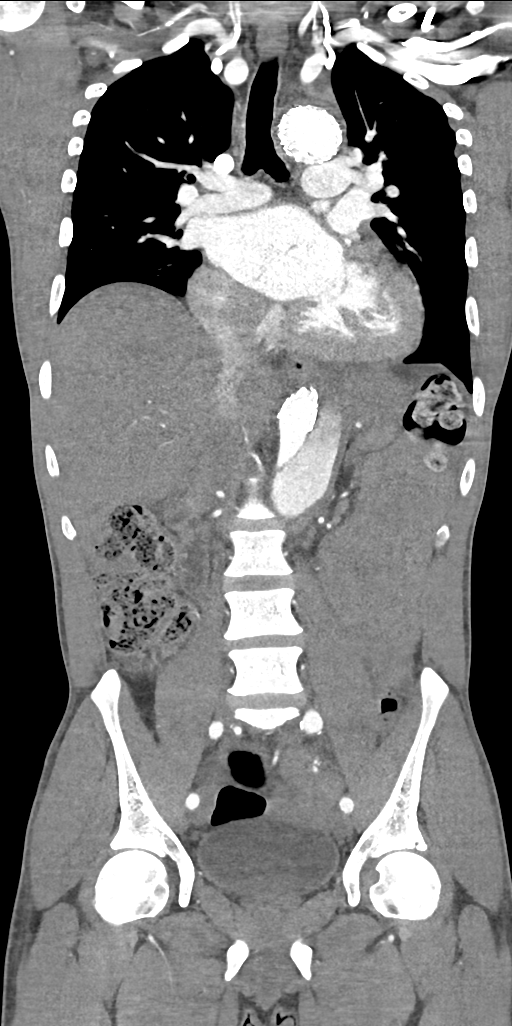
[im 71/95  soft-tissue]
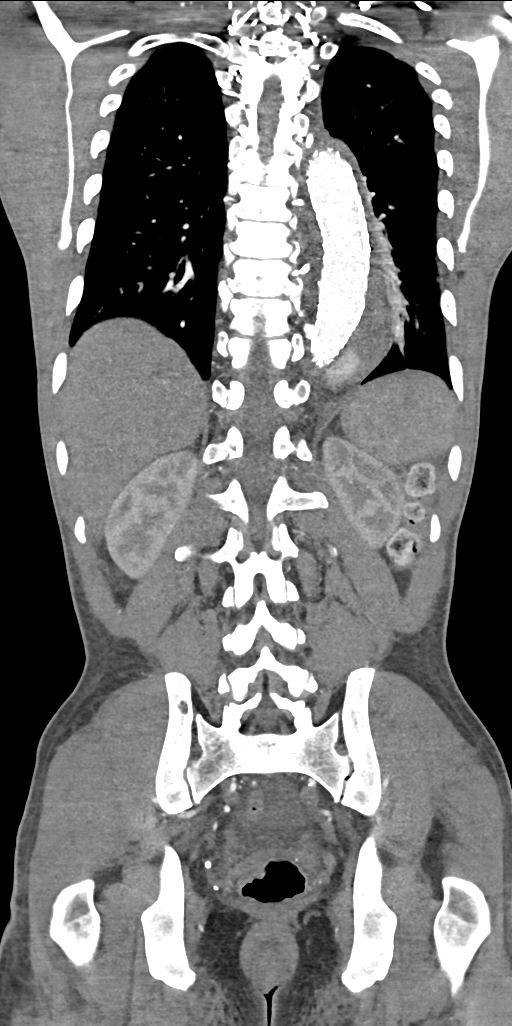

[12 of 46 positions shown; findings below may reference images not displayed]

FINDINGS: CTA CHEST FINDINGS

Mediastinum/Nodes: Patient status post aortic valve replacement with
mild aneurysmal dilatation at the sinuses of Valsalva measuring
cm, unchanged from prior. Re- demonstrated graft extending from the
proximal ascending thoracic aorta to the great vessels, grossly
patent. The proximal aspect of the left coronary artery measures up
to 1.3 cm, stable from prior. Endovascular stent graft repair is
demonstrated within the ascending aorta as well as the transverse
and descending thoracic aorta, unchanged from prior. The descending
thoracic aorta measures 5.1 x 4.8 cm, unchanged from prior were it
measured 5.2 x 4.7 cm (image 102; series 7). Re- demonstrated
contrast reflux into the descending thoracic aorta, into the false
lumen. Heart is enlarged. No pericardial effusion. No axillary,
mediastinal or hilar lymphadenopathy. Peripheral low attenuation
within the left brachiocephalic vein (image 31; series 7).

Lungs/Pleura: Small bilateral pleural effusions with dependent
atelectasis in the dependent aspects of the lower lobes bilaterally.
No pneumothorax. No large area of pulmonary consolidation.

Review of the MIP images confirms the above findings.

CTA ABDOMEN AND PELVIS FINDINGS

Arterial phase images limit evaluation of the solid parenchymal
organs.

Hepatobiliary: Liver is normal in size contour. Gallbladder is
unremarkable.

Pancreas: Unremarkable

Spleen: Unremarkable

Adrenals/Urinary Tract: Focal area of hypodensity within the mid
aspect of the left kidney (image 62; series 9). No hydronephrosis.
The urinary bladder is unremarkable.

Stomach/Bowel: No evidence for bowel dilatation. No evidence for
bowel obstruction. No bowel wall thickening. No free fluid or free
intraperitoneal air.

Vascular/Lymphatic: The thoracic aortic dissection continues into
the abdominal aorta with the dissection flap terminating at the
level below superior mesenteric artery origin. The celiac axis
arises from the false lumen. The superior mesenteric artery arises
from the true lumen. The right renal artery is patent and arises
from the true lumen. Interval placement of a stent within the
proximal left renal artery which arises from the true lumen. Grossly
unchanged aneurysmal dilatation of abdominal aorta measuring up to
4.7 x 3.7 cm. Re- demonstrated aneurysmal dilatation of the scratch
the re- demonstrated dilatation of the left common iliac artery
measuring 1.7 cm. Re- demonstrated moderate stenosis at the origin
of the left internal iliac artery. Mild narrowing at the origin of
the left external iliac artery.

Other: Slight interval decrease in size of low-attenuation lesion in
the right groin anterior to the right common femoral artery
measuring 4.4 x 2.3 cm (image 290; series 7). Small amount of free
fluid in the pelvis.

Musculoskeletal: No aggressive or acute appearing osseous lesions.

Review of the MIP images confirms the above findings.
IMPRESSION: Interval placement of proximal left renal artery stent. In the
interval there has been development of focal hypodensity within the
interpolar region of the left kidney which may represent renal
infarct. Focal pyelonephritis in the appropriate clinical setting is
not excluded.

Re- demonstrated postoperative changes compatible with aortic valve
replacement and vascular stent graft repair of aortic dissection.
The great vessels are patent. Re- demonstrated reflux of contrast
into the descending thoracic aorta which is grossly stable in
caliber when compared to prior examination.

Peripheral low attenuation within the left brachiocephalic vein may
represent non-opacified blood products. Thrombus is not excluded.

Unchanged dissection within the upper abdomen with associated
aneurysmal dilatation.

Re- demonstrated probable postoperative fluid collection within the
right inguinal region.

Small amount of free fluid in the pelvis.

These results were called by telephone at the time of interpretation
on 03/05/2015 at [DATE] to Dr. Adipah , who verbally acknowledged
these results.

## 2017-11-01 ENCOUNTER — Ambulatory Visit (INDEPENDENT_AMBULATORY_CARE_PROVIDER_SITE_OTHER): Payer: Medicare Other | Admitting: *Deleted

## 2017-11-01 DIAGNOSIS — I639 Cerebral infarction, unspecified: Secondary | ICD-10-CM | POA: Diagnosis not present

## 2017-11-02 NOTE — Progress Notes (Signed)
[facility] Summary Report / Loop Recorder 

## 2017-11-12 DIAGNOSIS — I7103 Dissection of thoracoabdominal aorta: Secondary | ICD-10-CM | POA: Diagnosis not present

## 2017-11-12 DIAGNOSIS — Z8679 Personal history of other diseases of the circulatory system: Secondary | ICD-10-CM | POA: Diagnosis not present

## 2017-11-12 DIAGNOSIS — I716 Thoracoabdominal aortic aneurysm, without rupture: Secondary | ICD-10-CM | POA: Diagnosis not present

## 2017-11-12 DIAGNOSIS — Z953 Presence of xenogenic heart valve: Secondary | ICD-10-CM | POA: Diagnosis not present

## 2017-11-12 DIAGNOSIS — Z48812 Encounter for surgical aftercare following surgery on the circulatory system: Secondary | ICD-10-CM | POA: Diagnosis not present

## 2017-11-12 DIAGNOSIS — F17210 Nicotine dependence, cigarettes, uncomplicated: Secondary | ICD-10-CM | POA: Diagnosis not present

## 2017-11-12 DIAGNOSIS — Z95828 Presence of other vascular implants and grafts: Secondary | ICD-10-CM | POA: Diagnosis not present

## 2017-11-15 LAB — CUP PACEART REMOTE DEVICE CHECK
Implantable Pulse Generator Implant Date: 20180406
MDC IDC SESS DTM: 20190716024013

## 2017-11-15 IMAGING — DX DG CHEST 2V
2 series · 2 of 2 positions shown · non-contrast
Comparison: Most recent thoracic imaging chest CT 03/13/2015

CLINICAL DATA: Chest pain and shortness of breath for 2 days.

EXAM:
CHEST  2 VIEW

[chest pa]
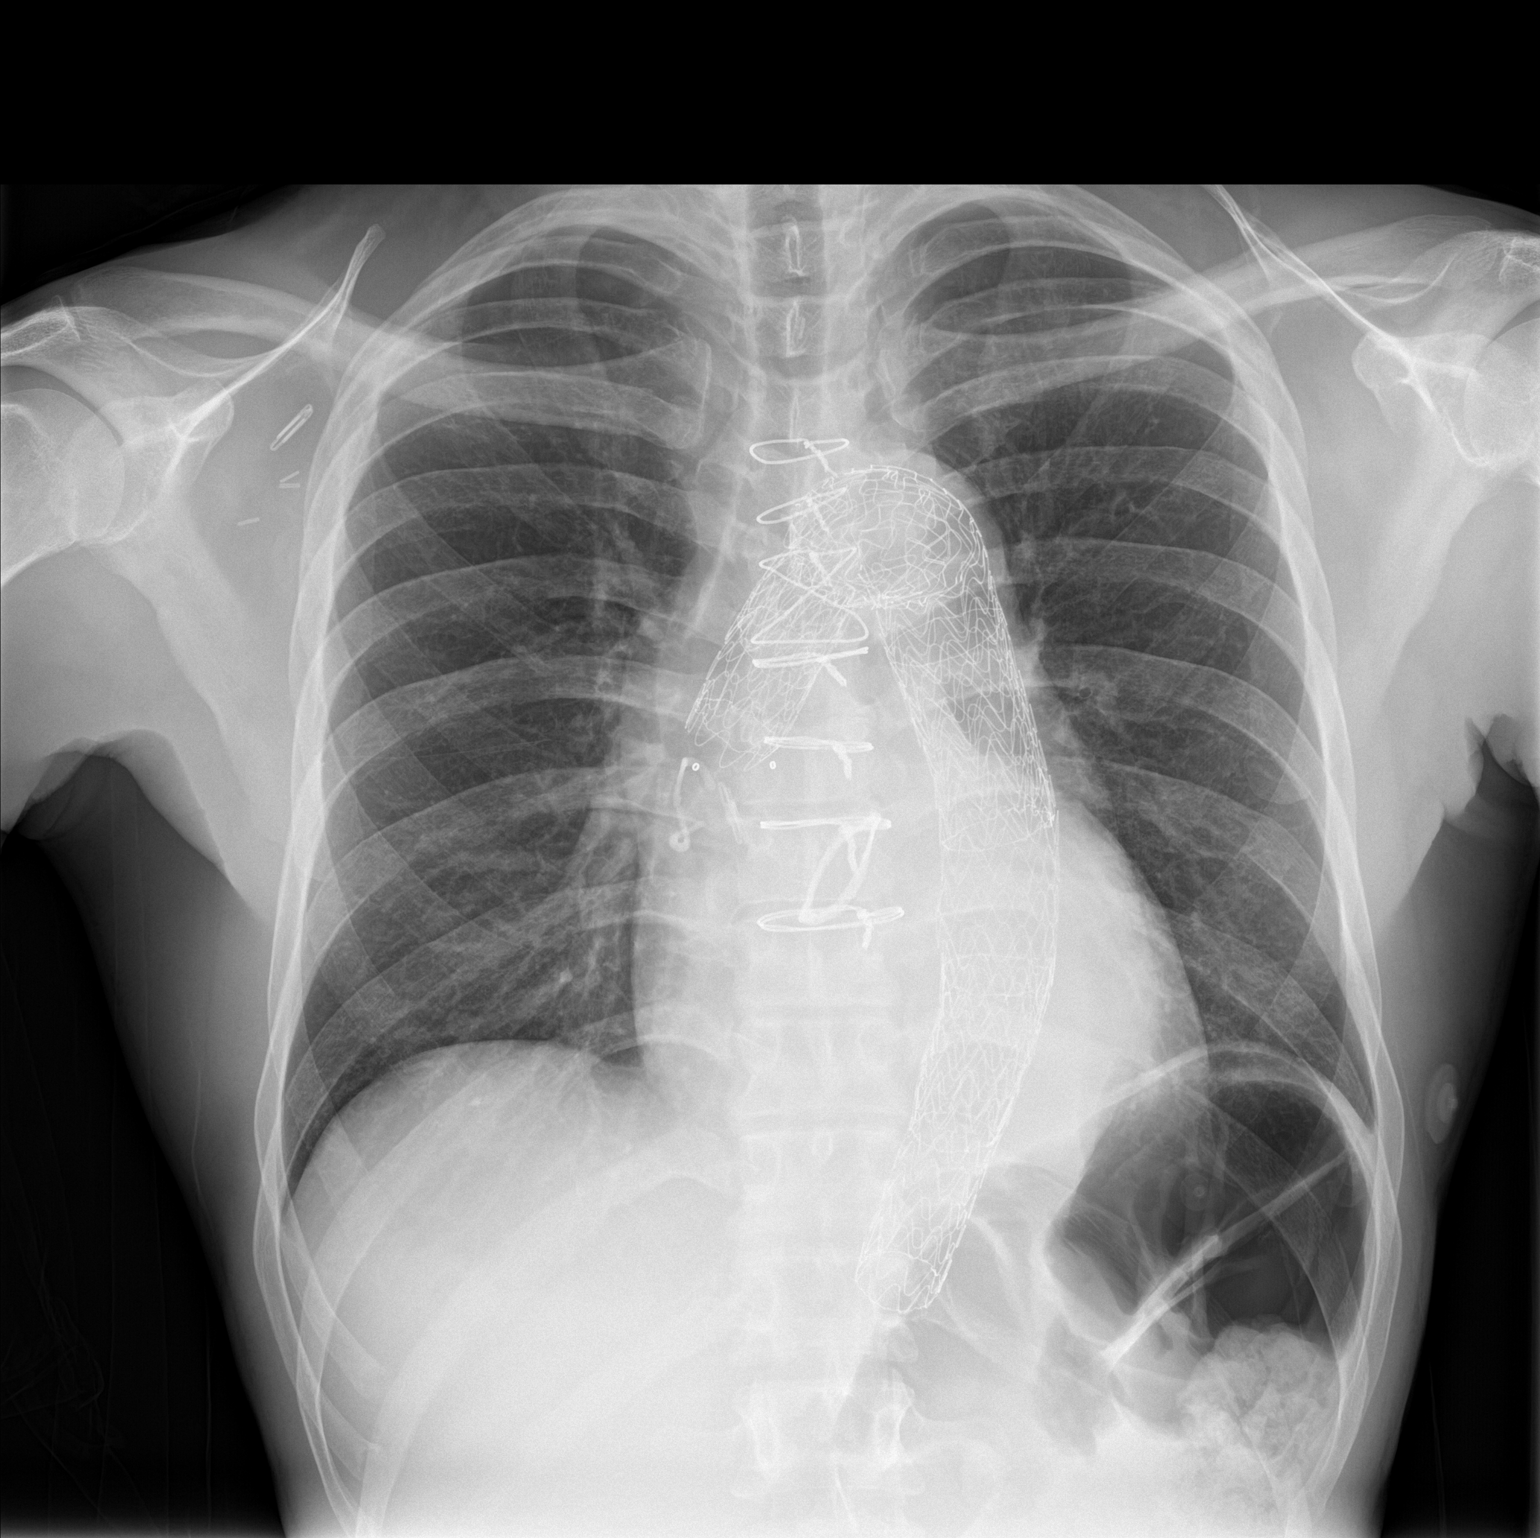

[chest lat]
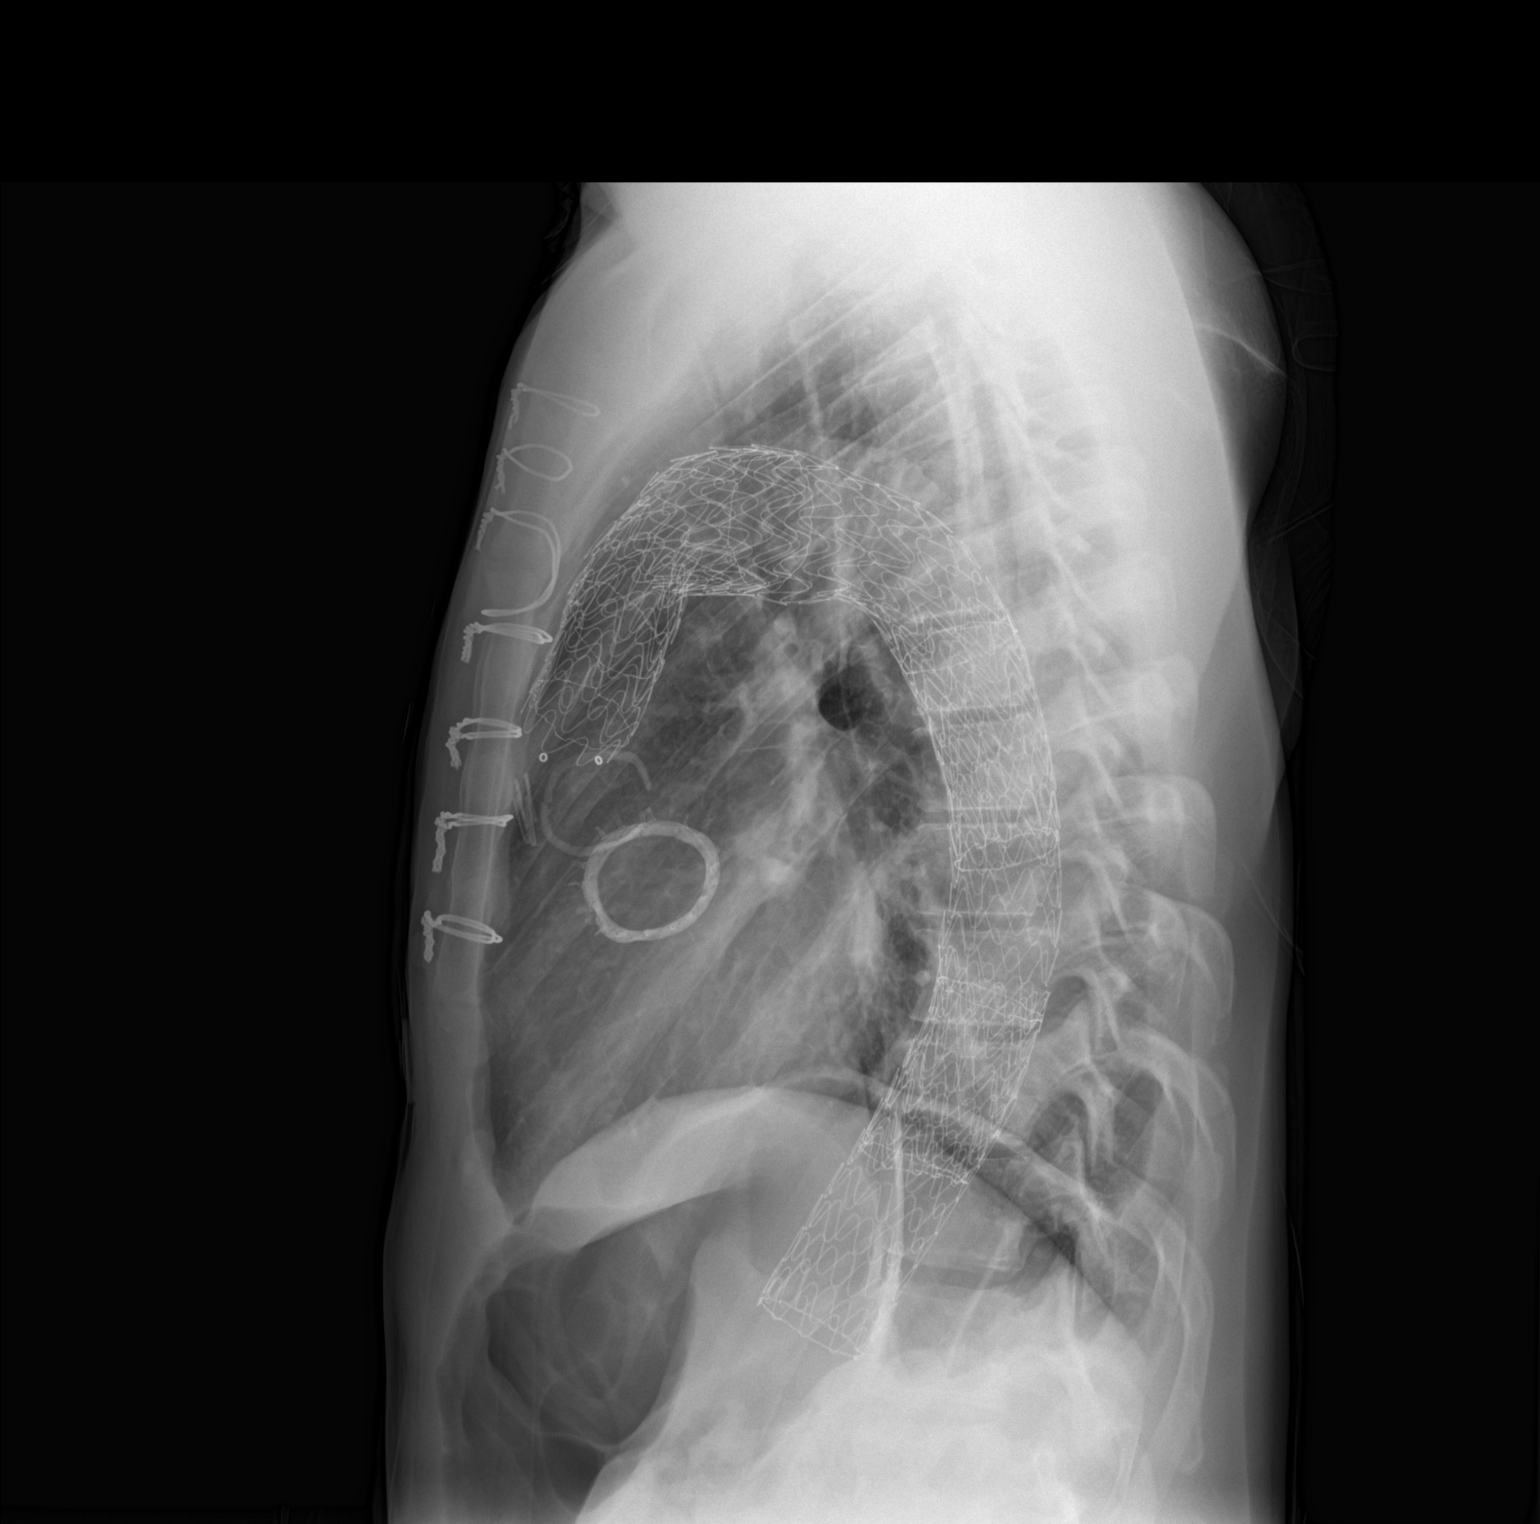

[2 of 2 positions shown; findings below may reference images not displayed]

FINDINGS: Post stent graft repair of aortic dissection and aortic valve
replacement, cardiomediastinal contours are unchanged in appearance.
Patient is post median sternotomy. No pulmonary edema, confluent
airspace disease, pleural effusion or pneumothorax. No acute osseous
abnormalities are seen. Surgical clips in the right axilla.
IMPRESSION: No acute process. Post aortic valve replacement and repair of
thoracic aortic dissection, unchanged in appearance from prior CT 2
weeks prior.

## 2017-11-16 IMAGING — CT CT ANGIO CHEST
2 of 7 series · 15 of 46 positions shown · IV contrast (omnipaque)
Comparison: Multiple prior exams most recently 2 weeks prior
03/05/2015

CLINICAL DATA: Left-sided chest pain. Shortness of breath. History
of aortic dissection post repair.

EXAM:
CT ANGIOGRAPHY CHEST, ABDOMEN AND PELVIS
TECHNIQUE: Multidetector CT imaging through the chest, abdomen and pelvis was
performed using the standard protocol during bolus administration of
intravenous contrast. Multiplanar reconstructed images and MIPs were
obtained and reviewed to evaluate the vascular anatomy.
CONTRAST:  100mL OMNIPAQUE IOHEXOL 350 MG/ML SOLN

[Series 6: dissection 2mm · axial · 0.78mm/px · z∈[-620,-14]mm · 12 of 341 slices shown]
[im 19/341  lung]
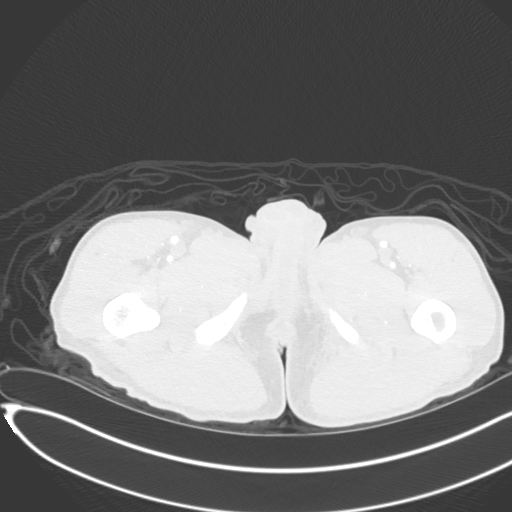
[im 57/341  soft-tissue]
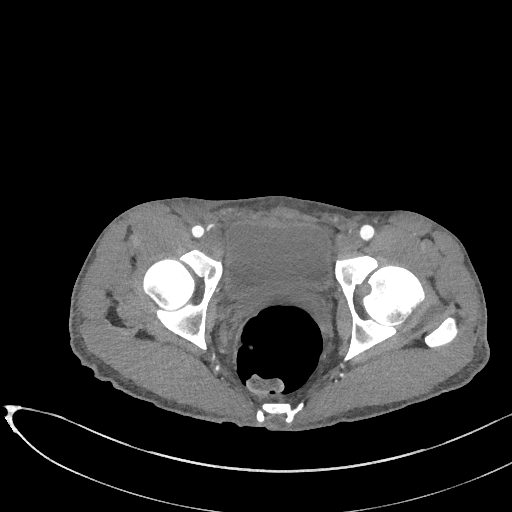
[im 76/341  lung]
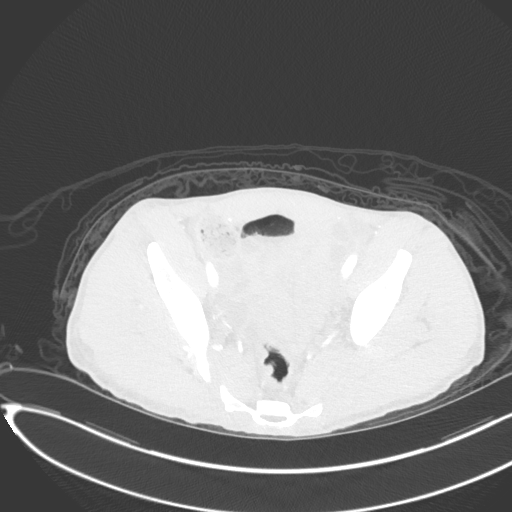
[im 95/341  soft-tissue]
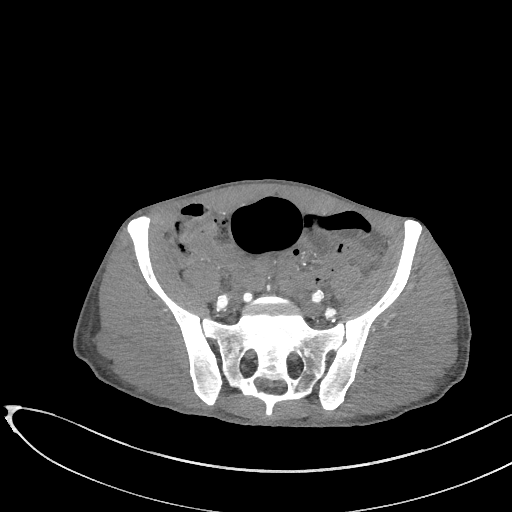
[im 133/341  lung]
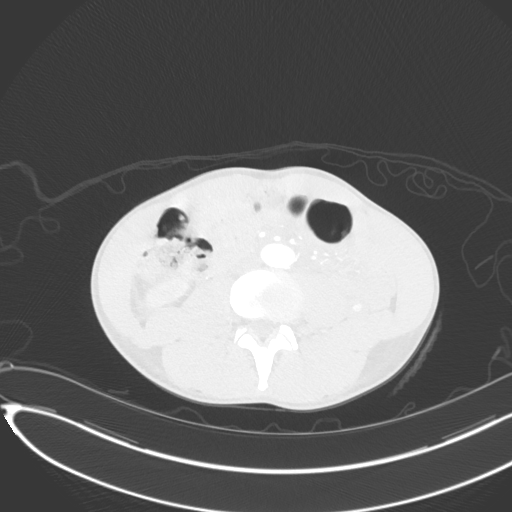
[im 152/341  soft-tissue]
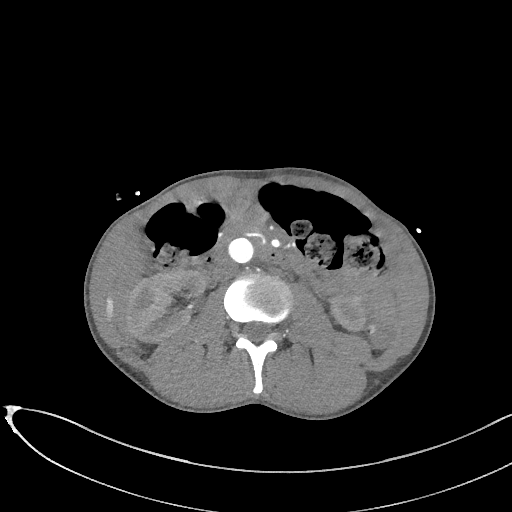
[im 189/341  lung]
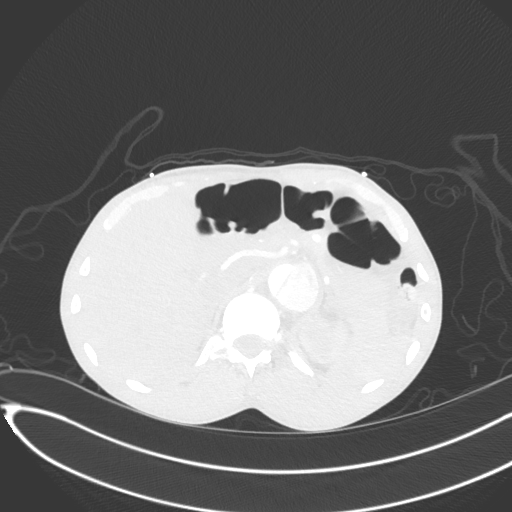
[im 208/341  soft-tissue]
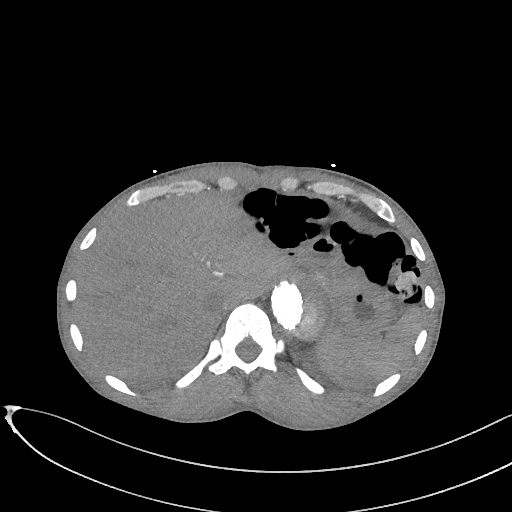
[im 246/341  lung]
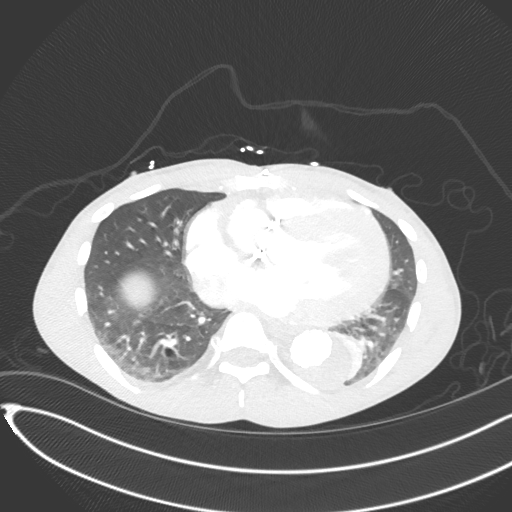
[im 265/341  soft-tissue]
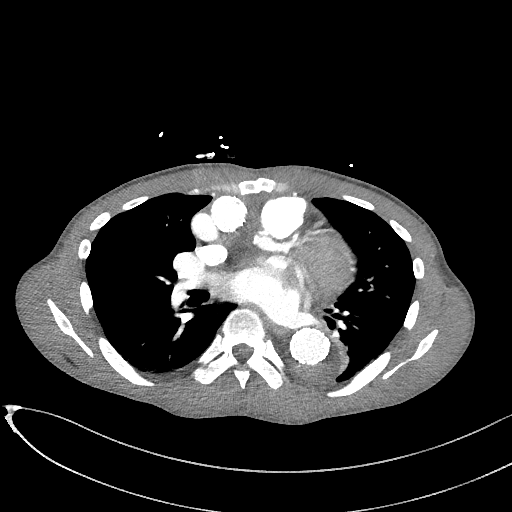
[im 284/341  lung]
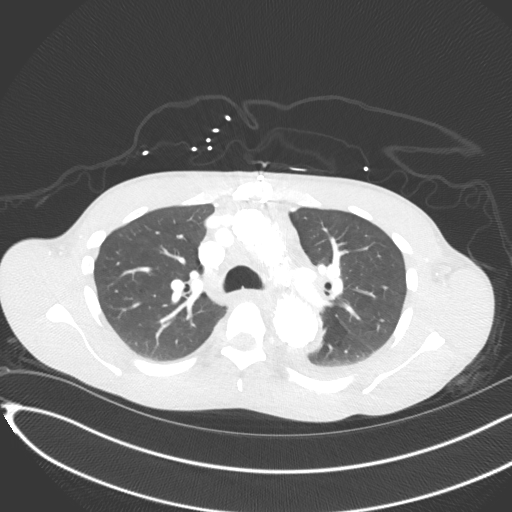
[im 322/341  soft-tissue]
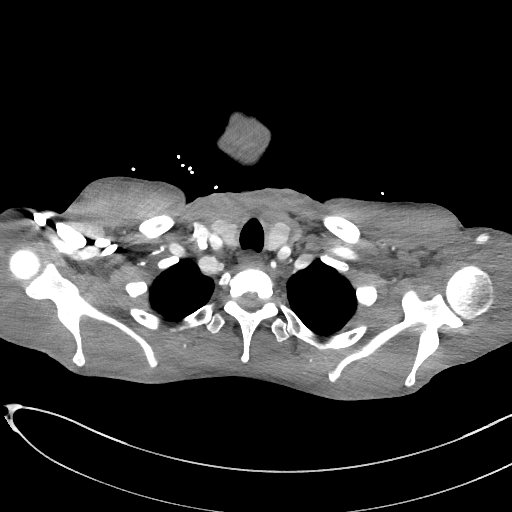

[Series 9: dissection 2mm cor · coronal · 0.70mm/px · 3 of 104 slices shown]
[im 26/104  soft-tissue]
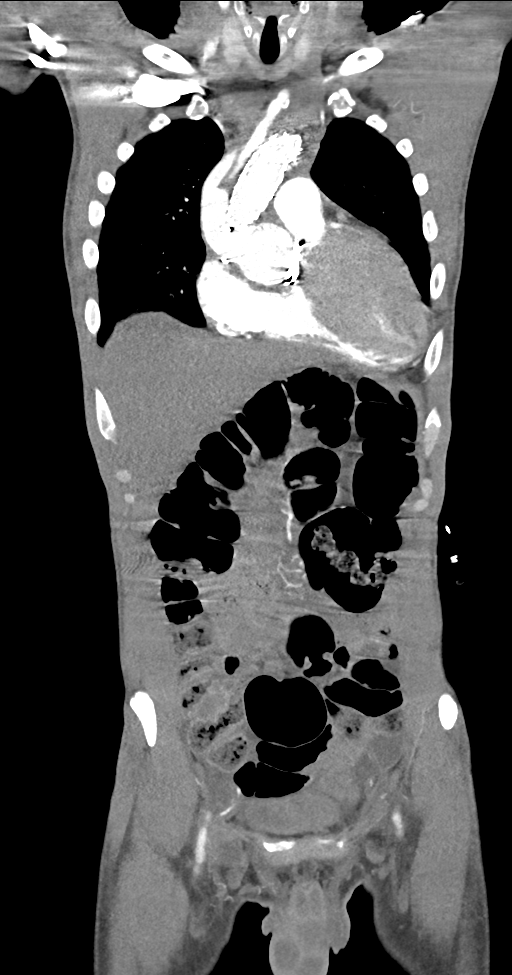
[im 52/104  soft-tissue]
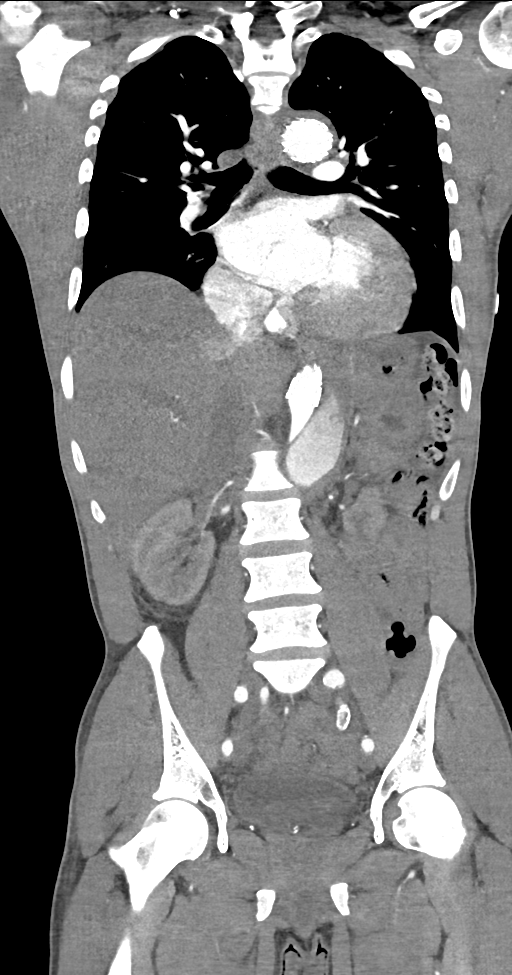
[im 78/104  soft-tissue]
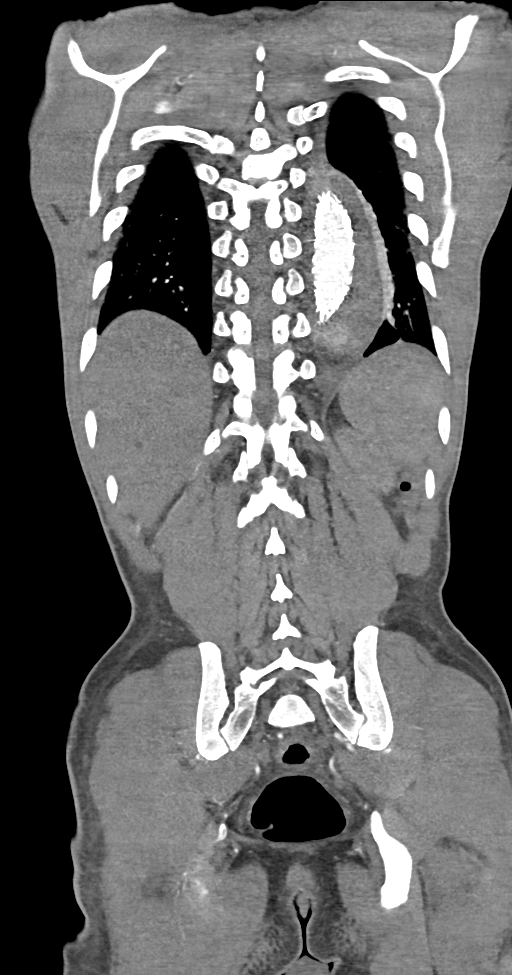

[15 of 46 positions shown; findings below may reference images not displayed]

FINDINGS: CTA CHEST FINDINGS

Post aortic valve replacement and stent graft repair of aortic
dissection. Graft is unchanged in appearance and position. Graft
extends from the ascending aorta through the proximal abdominal
aorta. Great vessels are seen proximal to the graft and patent.
Proximal left coronary artery is again noted to be dilated measuring
1.3 cm. No periaortic soft tissue stranding. Contrast within the
false lumen of the descending thoracic aorta is similar in
appearance to prior exam. Multi chamber cardiomegaly is grossly
stable. No pericardial effusion. Trace bilateral pleural effusions.

There is compressive atelectasis in the left lung adjacent to the
descending thoracic aorta. Minimal dependent atelectasis in the
right lower lobe. No pulmonary edema. No consolidation.

There are no acute or suspicious osseous abnormalities.

Review of the MIP images confirms the above findings.

CTA ABDOMEN AND PELVIS FINDINGS

The thoracic aorta dissection flap again extends into the upper
abdominal aorta O contrast opacifying the true and false lumen. The
celiac artery arises from the false lumen. Superior mesenteric
artery arises from the true lumen, right renal artery from the true
lumen and patent. Left renal artery stent arising from the true
lumen. Inferior mesenteric artery is patent. Aneurysmal dimensions
of the aorta appear stable from prior exam. Aneurysmal dilatation of
the left common iliac artery up to 1.7 cm. Mild of stenosis at the
origin of the left external iliac artery, stable.

Evaluation of the remaining solid and hollow viscera is limited
given arterial phase imaging and paucity of intra-abdominal fat.

Minimal contrast refluxing into the hepatic veins and IVC.
Gallbladder is obscured and not well evaluated. Pancreas and spleen
are grossly normal. Symmetric renal enhancement. Adrenal glands are
not well seen.

Markedly limited bowel evaluation given arterial phase imaging,
paucity of intra-abdominal fat and lack of enteric contrast.
Moderate stool is seen throughout the colon.

Within the pelvis the bladder is physiologically distended. Ovoid
low-attenuation density anterior to the right femoral artery has
decreased in size current measurement 4.0 x 2.1 cm, previously 4.4 x
2.3 cm. Fluid-filled bowel loops in the pelvis limiting assessment
for free fluid.

There are no acute or suspicious osseous abnormalities.

Review of the MIP images confirms the above findings.
IMPRESSION: 1. Thoracoabdominal dissection post aortic valve replacement and
repair with stent graft. The overall appearance is stable from prior
exams.
2. Cardiomegaly. Minimal contrast refluxing into the hepatic veins
and IVC may reflect fluid overload/right heart failure.
3. Trace pleural effusions, unchanged.
4. Limited assessment of intra-abdominal organs due to arterial
phase imaging and paucity of intra-abdominal fat. No definite acute
abnormality is seen.

## 2017-11-17 ENCOUNTER — Telehealth: Payer: Self-pay

## 2017-11-17 ENCOUNTER — Telehealth: Payer: Self-pay | Admitting: Cardiology

## 2017-11-17 NOTE — Telephone Encounter (Signed)
Patient called back and stated that he could not send a manual transmission due to error code [id number]. Patient stated that the reader never leaves the base and he has gotten this error code ever since he has had the monitor. I informed pt that I sent a message to [employer] to call him to discuss a replacement monitor. I informed him that since the nurse wanted a manual transmission I would have her call him to discuss weather or not he could wait until he received a new monitor to send a manual transmission. Pt verbalized understanding.

## 2017-11-17 NOTE — Telephone Encounter (Signed)
LMOVM reminding pt to send remote transmission.   

## 2017-12-02 ENCOUNTER — Other Ambulatory Visit: Payer: Self-pay

## 2017-12-02 ENCOUNTER — Emergency Department (HOSPITAL_COMMUNITY): Payer: Medicare Other

## 2017-12-02 ENCOUNTER — Encounter (HOSPITAL_COMMUNITY): Payer: Self-pay | Admitting: Emergency Medicine

## 2017-12-02 ENCOUNTER — Inpatient Hospital Stay (HOSPITAL_COMMUNITY)
Admission: EM | Admit: 2017-12-02 | Discharge: 2017-12-11 | DRG: 246 | Disposition: A | Discharge status: Home / Self Care | Payer: Medicare Other | Attending: Cardiology | Admitting: Cardiology

## 2017-12-02 DIAGNOSIS — I712 Thoracic aortic aneurysm, without rupture: Secondary | ICD-10-CM | POA: Diagnosis not present

## 2017-12-02 DIAGNOSIS — I130 Hypertensive heart and chronic kidney disease with heart failure and stage 1 through stage 4 chronic kidney disease, or unspecified chronic kidney disease: Secondary | ICD-10-CM | POA: Diagnosis present

## 2017-12-02 DIAGNOSIS — E782 Mixed hyperlipidemia: Secondary | ICD-10-CM | POA: Diagnosis present

## 2017-12-02 DIAGNOSIS — E785 Hyperlipidemia, unspecified: Secondary | ICD-10-CM | POA: Diagnosis present

## 2017-12-02 DIAGNOSIS — Z79899 Other long term (current) drug therapy: Secondary | ICD-10-CM

## 2017-12-02 DIAGNOSIS — F17210 Nicotine dependence, cigarettes, uncomplicated: Secondary | ICD-10-CM | POA: Diagnosis present

## 2017-12-02 DIAGNOSIS — Z8249 Family history of ischemic heart disease and other diseases of the circulatory system: Secondary | ICD-10-CM

## 2017-12-02 DIAGNOSIS — N179 Acute kidney failure, unspecified: Secondary | ICD-10-CM | POA: Diagnosis present

## 2017-12-02 DIAGNOSIS — I2510 Atherosclerotic heart disease of native coronary artery without angina pectoris: Secondary | ICD-10-CM | POA: Diagnosis not present

## 2017-12-02 DIAGNOSIS — I161 Hypertensive emergency: Secondary | ICD-10-CM | POA: Diagnosis present

## 2017-12-02 DIAGNOSIS — M792 Neuralgia and neuritis, unspecified: Secondary | ICD-10-CM

## 2017-12-02 DIAGNOSIS — I5043 Acute on chronic combined systolic (congestive) and diastolic (congestive) heart failure: Secondary | ICD-10-CM | POA: Diagnosis present

## 2017-12-02 DIAGNOSIS — I25119 Atherosclerotic heart disease of native coronary artery with unspecified angina pectoris: Secondary | ICD-10-CM | POA: Diagnosis not present

## 2017-12-02 DIAGNOSIS — N183 Chronic kidney disease, stage 3 unspecified: Secondary | ICD-10-CM | POA: Diagnosis present

## 2017-12-02 DIAGNOSIS — R51 Headache: Secondary | ICD-10-CM | POA: Diagnosis not present

## 2017-12-02 DIAGNOSIS — I10 Essential (primary) hypertension: Secondary | ICD-10-CM | POA: Diagnosis not present

## 2017-12-02 DIAGNOSIS — Z953 Presence of xenogenic heart valve: Secondary | ICD-10-CM

## 2017-12-02 DIAGNOSIS — Z833 Family history of diabetes mellitus: Secondary | ICD-10-CM | POA: Diagnosis not present

## 2017-12-02 DIAGNOSIS — I7121 Aneurysm of the ascending aorta, without rupture: Secondary | ICD-10-CM | POA: Diagnosis present

## 2017-12-02 DIAGNOSIS — K219 Gastro-esophageal reflux disease without esophagitis: Secondary | ICD-10-CM | POA: Diagnosis present

## 2017-12-02 DIAGNOSIS — Z720 Tobacco use: Secondary | ICD-10-CM | POA: Diagnosis present

## 2017-12-02 DIAGNOSIS — I711 Thoracic aortic aneurysm, ruptured: Secondary | ICD-10-CM | POA: Diagnosis not present

## 2017-12-02 DIAGNOSIS — Z8673 Personal history of transient ischemic attack (TIA), and cerebral infarction without residual deficits: Secondary | ICD-10-CM

## 2017-12-02 DIAGNOSIS — R110 Nausea: Secondary | ICD-10-CM | POA: Diagnosis not present

## 2017-12-02 DIAGNOSIS — I214 Non-ST elevation (NSTEMI) myocardial infarction: Secondary | ICD-10-CM | POA: Diagnosis present

## 2017-12-02 DIAGNOSIS — Z955 Presence of coronary angioplasty implant and graft: Secondary | ICD-10-CM

## 2017-12-02 DIAGNOSIS — N170 Acute kidney failure with tubular necrosis: Secondary | ICD-10-CM | POA: Diagnosis not present

## 2017-12-02 DIAGNOSIS — I119 Hypertensive heart disease without heart failure: Secondary | ICD-10-CM | POA: Diagnosis present

## 2017-12-02 DIAGNOSIS — I359 Nonrheumatic aortic valve disorder, unspecified: Secondary | ICD-10-CM | POA: Diagnosis not present

## 2017-12-02 DIAGNOSIS — I43 Cardiomyopathy in diseases classified elsewhere: Secondary | ICD-10-CM | POA: Diagnosis present

## 2017-12-02 DIAGNOSIS — G4440 Drug-induced headache, not elsewhere classified, not intractable: Secondary | ICD-10-CM | POA: Diagnosis present

## 2017-12-02 DIAGNOSIS — T463X5A Adverse effect of coronary vasodilators, initial encounter: Secondary | ICD-10-CM | POA: Diagnosis present

## 2017-12-02 DIAGNOSIS — Z952 Presence of prosthetic heart valve: Secondary | ICD-10-CM | POA: Diagnosis not present

## 2017-12-02 DIAGNOSIS — R0789 Other chest pain: Secondary | ICD-10-CM | POA: Diagnosis not present

## 2017-12-02 DIAGNOSIS — I340 Nonrheumatic mitral (valve) insufficiency: Secondary | ICD-10-CM | POA: Diagnosis not present

## 2017-12-02 DIAGNOSIS — R079 Chest pain, unspecified: Secondary | ICD-10-CM | POA: Diagnosis not present

## 2017-12-02 DIAGNOSIS — I7100 Dissection of unspecified site of aorta: Secondary | ICD-10-CM | POA: Diagnosis present

## 2017-12-02 DIAGNOSIS — I639 Cerebral infarction, unspecified: Secondary | ICD-10-CM | POA: Diagnosis not present

## 2017-12-02 LAB — CBC WITH DIFFERENTIAL/PLATELET
Abs Immature Granulocytes: 0 10*3/uL (ref 0.0–0.1)
Basophils Absolute: 0 10*3/uL (ref 0.0–0.1)
Basophils Relative: 1 %
Eosinophils Absolute: 0.3 10*3/uL (ref 0.0–0.7)
Eosinophils Relative: 5 %
HCT: 41.4 % (ref 39.0–52.0)
Hemoglobin: 13.2 g/dL (ref 13.0–17.0)
Immature Granulocytes: 0 %
Lymphocytes Relative: 25 %
Lymphs Abs: 1.3 10*3/uL (ref 0.7–4.0)
MCH: 31.1 pg (ref 26.0–34.0)
MCHC: 31.9 g/dL (ref 30.0–36.0)
MCV: 97.6 fL (ref 78.0–100.0)
Monocytes Absolute: 0.5 10*3/uL (ref 0.1–1.0)
Monocytes Relative: 10 %
Neutro Abs: 3.1 10*3/uL (ref 1.7–7.7)
Neutrophils Relative %: 59 %
Platelets: 112 10*3/uL — ABNORMAL LOW (ref 150–400)
RBC: 4.24 MIL/uL (ref 4.22–5.81)
RDW: 13.7 % (ref 11.5–15.5)
WBC: 5.2 10*3/uL (ref 4.0–10.5)

## 2017-12-02 LAB — COMPREHENSIVE METABOLIC PANEL
ALT: 10 U/L (ref 0–44)
AST: 18 U/L (ref 15–41)
Albumin: 3.4 g/dL — ABNORMAL LOW (ref 3.5–5.0)
Alkaline Phosphatase: 83 U/L (ref 38–126)
Anion gap: 7 (ref 5–15)
BUN: 18 mg/dL (ref 6–20)
CO2: 24 mmol/L (ref 22–32)
Calcium: 8.9 mg/dL (ref 8.9–10.3)
Chloride: 108 mmol/L (ref 98–111)
Creatinine, Ser: 2.07 mg/dL — ABNORMAL HIGH (ref 0.61–1.24)
GFR calc Af Amer: 43 mL/min — ABNORMAL LOW (ref >60)
GFR calc non Af Amer: 37 mL/min — ABNORMAL LOW (ref >60)
Glucose, Bld: 105 mg/dL — ABNORMAL HIGH (ref 70–99)
Potassium: 4 mmol/L (ref 3.5–5.1)
Sodium: 139 mmol/L (ref 135–145)
Total Bilirubin: 0.8 mg/dL (ref 0.3–1.2)
Total Protein: 6.6 g/dL (ref 6.5–8.1)

## 2017-12-02 LAB — I-STAT TROPONIN, ED: Troponin i, poc: 1.69 ng/mL (ref 0.00–0.08)

## 2017-12-02 LAB — TROPONIN I: Troponin I: 2.14 ng/mL (ref <0.03)

## 2017-12-02 MED ORDER — CLEVIDIPINE BUTYRATE 0.5 MG/ML IV EMUL
INTRAVENOUS | Status: AC
  Administered 2017-12-02: 16 mg via INTRAVENOUS
  Filled 2017-12-02: qty 50

## 2017-12-02 MED ORDER — HYDRALAZINE HCL 20 MG/ML IJ SOLN
5.0000 mg | INTRAMUSCULAR | Status: DC | PRN
  Administered 2017-12-03 – 2017-12-04 (×2): 5 mg via INTRAVENOUS
  Filled 2017-12-02 (×2): qty 1

## 2017-12-02 MED ORDER — ACETAMINOPHEN 325 MG PO TABS
650.0000 mg | ORAL_TABLET | Freq: Four times a day (QID) | ORAL | Status: DC | PRN
  Administered 2017-12-02 – 2017-12-08 (×14): 650 mg via ORAL
  Filled 2017-12-02 (×15): qty 2

## 2017-12-02 MED ORDER — FENTANYL CITRATE (PF) 100 MCG/2ML IJ SOLN: 50.0000 ug | Freq: Once | INTRAMUSCULAR | Status: DC

## 2017-12-02 MED ORDER — NITROGLYCERIN 0.4 MG SL SUBL
0.4000 mg | SUBLINGUAL_TABLET | SUBLINGUAL | Status: DC | PRN
  Administered 2017-12-02 – 2017-12-07 (×2): 0.4 mg via SUBLINGUAL
  Filled 2017-12-02 (×2): qty 1

## 2017-12-02 MED ORDER — LABETALOL HCL 200 MG PO TABS
300.0000 mg | ORAL_TABLET | Freq: Every day | ORAL | Status: DC
  Administered 2017-12-03: 300 mg via ORAL

## 2017-12-02 MED ORDER — IOPAMIDOL (ISOVUE-370) INJECTION 76%
80.0000 mL | Freq: Once | INTRAVENOUS | Status: AC | PRN
  Administered 2017-12-02: 100 mL via INTRAVENOUS

## 2017-12-02 MED ORDER — AMLODIPINE BESYLATE 10 MG PO TABS
10.0000 mg | ORAL_TABLET | Freq: Every day | ORAL | Status: DC
  Administered 2017-12-03 – 2017-12-11 (×9): 10 mg via ORAL
  Filled 2017-12-02 (×9): qty 1

## 2017-12-02 MED ORDER — CLEVIDIPINE BUTYRATE 0.5 MG/ML IV EMUL
0.0000 mg/h | INTRAVENOUS | Status: DC
  Administered 2017-12-02 (×2): 16 mg/h via INTRAVENOUS
  Administered 2017-12-02: 16 mg via INTRAVENOUS
  Administered 2017-12-02: 1 mg/h via INTRAVENOUS
  Filled 2017-12-02 (×4): qty 50

## 2017-12-02 MED ORDER — FENTANYL CITRATE (PF) 100 MCG/2ML IJ SOLN
100.0000 ug | Freq: Once | INTRAMUSCULAR | Status: AC
  Administered 2017-12-02: 100 ug via INTRAVENOUS
  Filled 2017-12-02: qty 2

## 2017-12-02 MED ORDER — ONDANSETRON HCL 4 MG/2ML IJ SOLN: 4.0000 mg | Freq: Four times a day (QID) | INTRAMUSCULAR | Status: DC | PRN

## 2017-12-02 MED ORDER — CLONIDINE HCL 0.3 MG/24HR TD PTWK
0.3000 mg | MEDICATED_PATCH | TRANSDERMAL | Status: DC
  Administered 2017-12-03 – 2017-12-10 (×2): 0.3 mg via TRANSDERMAL
  Filled 2017-12-02 (×2): qty 1

## 2017-12-02 MED ORDER — IOPAMIDOL (ISOVUE-370) INJECTION 76%
INTRAVENOUS | Status: AC
  Filled 2017-12-02: qty 100

## 2017-12-02 MED ORDER — HEPARIN (PORCINE) IN NACL 100-0.45 UNIT/ML-% IJ SOLN
1250.0000 [IU]/h | INTRAMUSCULAR | Status: DC
  Administered 2017-12-02: 800 [IU]/h via INTRAVENOUS
  Administered 2017-12-03: 1100 [IU]/h via INTRAVENOUS
  Administered 2017-12-05 – 2017-12-06 (×2): 1250 [IU]/h via INTRAVENOUS
  Filled 2017-12-02 (×5): qty 250

## 2017-12-02 MED ORDER — HEPARIN BOLUS VIA INFUSION
4000.0000 [IU] | Freq: Once | INTRAVENOUS | Status: AC
  Administered 2017-12-02: 4000 [IU] via INTRAVENOUS
  Filled 2017-12-02: qty 4000

## 2017-12-02 MED ORDER — ASPIRIN EC 81 MG PO TBEC
81.0000 mg | DELAYED_RELEASE_TABLET | Freq: Every day | ORAL | Status: DC
  Administered 2017-12-03 – 2017-12-11 (×9): 81 mg via ORAL
  Filled 2017-12-02 (×10): qty 1

## 2017-12-02 MED ORDER — ATORVASTATIN CALCIUM 20 MG PO TABS
20.0000 mg | ORAL_TABLET | Freq: Every day | ORAL | Status: DC
  Administered 2017-12-03 – 2017-12-05 (×3): 20 mg via ORAL
  Filled 2017-12-02 (×3): qty 1

## 2017-12-02 MED ORDER — SODIUM CHLORIDE 0.9 % IV SOLN
INTRAVENOUS | Status: DC
  Administered 2017-12-02 – 2017-12-06 (×5): via INTRAVENOUS

## 2017-12-02 MED ORDER — ASPIRIN 81 MG PO CHEW
324.0000 mg | CHEWABLE_TABLET | Freq: Once | ORAL | Status: AC
  Administered 2017-12-02: 324 mg via ORAL
  Filled 2017-12-02: qty 4

## 2017-12-02 MED ORDER — NITROGLYCERIN IN D5W 200-5 MCG/ML-% IV SOLN
2.0000 ug/min | INTRAVENOUS | Status: DC
  Administered 2017-12-02: 5 ug/min via INTRAVENOUS
  Filled 2017-12-02: qty 250

## 2017-12-02 NOTE — ED Notes (Signed)
Attempted to call report. Paged cardiologist

## 2017-12-02 NOTE — ED Notes (Signed)
Attempted to call report

## 2017-12-02 NOTE — H&P (Signed)
Cardiology Admission History and Physical:   Patient ID: [name] MRN: [id number]; DOB: April 29, 1970   Admission date: 12/02/2017  Primary Care Provider: Clent Demark, PA-C Primary Cardiologist: Dr Aundra Dubin Primary Electrophysiologist:  Dr Caryl Comes  Chief Complaint:  Chest pain  Patient Profile:   [name] is a 47 y.o. male with past medical history of aortic dissection repair, severe hypertension, prior substance abuse, hyperlipidemia, chronic chest pain for evaluation of non-ST elevation myocardial infarction.  History of Present Illness:   Patient is status post aortic dissection in February 2016.  Left heart catheterization April 2016 demonstrated a 60% first diagonal.  He was transferred to [facility] and underwent aortic valve replacement with a bovine pericardial tissue valve and coronary reconstruction.  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  He has had intermittent chest pain since that time by his report.  He presents with 48 hours of left-sided chest pain radiating to substernal area.  He describes the pain as a sharp pain and gets worse with lying on his right side.  It also increases with inspiration.  It is similar to his previous pain but did not completely resolved.  He therefore presented to the emergency room.  Symptoms improved with pain medications.  Troponin elevated and cardiology asked to evaluate.  Last cardiogram December 2017 showed ejection fraction 55 to 60%, prior aortic valve replacement, prior ascending aortic graft repair, mild mitral regurgitation, mild to moderate left atrial enlargement.   Past Medical History:  Diagnosis Date  . Adenomatous colon polyp 08/2015  . Anxiety   . Aortic disease ([place])   . Aortic dissection ([facility])    a. admx 04/2014 >> L renal infarct; a/c renal failure >> b.  s/p Bioprosthetic Bentall and total arch replacement and staged endovascular repair of descending aortic  aneurysm ([facility] - Dr. [name])  . CAD (coronary artery disease)    a. LHC 4/16:  oD1 60%  . Cardiomyopathy ([place])    a. non-ischemic - probably related to untreated HTN and ETOH abuse - Echo 3/13 with EF 35-40% >> b. Echo 4/16: Severe LVH, EF 55-60%, moderate AI, moderate MR, mild LAE, trivial effusion, known type B dissection with communication between true and false lumens with suprasternal images suggesting dissection plane may propagate to at least left subclavian takeoff, root above aortic valve okay    . Chronic abdominal pain   . Chronic combined systolic and diastolic congestive heart failure ([place])    a. 05/2011: Adm with pulm edema/HTN urgency, EF 35-40% with diffuse hypokinesis and moderate to severe mitral regurgitation. Cardiomyopathy likely due to uncontrolled HTN and ETOH abuse - cath deferred due to renal insufficiency (felt due to uncontrolled HTN). bJodie Echevaria MV 06/2011: EF 37% and no ischemia or infarction. c. EF 45-50% by echo 01/2012.  [name] Chronic sinusitis   . CKD (chronic kidney disease)    a. 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  peak creatinine 3.46 during admx for aortic dissection 2/16.  sees Dr [name]  . Colon polyps 09/11/2015   Descending, sigmoid polyps  . DDD (degenerative disc disease), lumbar   . Depression   . Descending thoracic aortic aneurysm ([place])   . Dissecting aneurysm of thoracic aorta ([place])   . ETOH abuse    a. Reported to have quit 05/2011.  . Frequent headaches   . GERD (gastroesophageal reflux disease)   . Headache(784.0)    "q other day" (08/08/2013)  . Heart murmur   .  Hemorrhoid thrombosis   . History of echocardiogram    Echo 1/17:  Severe LVH, EF 55-60%, no RWMA, Gr 2 DD, AVR ok, mild to mod MR, mild LAE, mild reduced RVSF, mod RAE  . History of medication noncompliance   . HYPERLIPIDEMIA   . Hypertension    a. Hx of HTN urgency secondary to noncompliance. b. urinary metanephrine and catecholeamine levels normal 2013.  c. Renal art [name] 1/16:  No  evidence of renal artery stenosis noted bilaterally.  . INGUINAL HERNIA   . Pneumonia ~ 2013  . Renal insufficiency   . Stroke ([place])   . Tobacco abuse   . Valvular heart disease    a. Echo 05/2011: moderate to severe eccentric MR and mild to moderate AI with prolapsing left coronary cusp. b. Echo 01/2012: mild-mod AI, mild dilitation of aortic root, mild MR.;  c. Echo 1/16: Severe LVH consistent with hypertrophic cardio myopathy, EF 50%, no RWMA, mod AI, mild MR, mild RAE, dilated Ao root (40 mm);     Past Surgical History:  Procedure Laterality Date  . ANKLE SURGERY Bilateral    Fractures bilaterally  . AORTIC VALVE SURGERY  09/2014  . FOOT FRACTURE SURGERY Bilateral 2004-2010   "got pins in both of them"  . HEMORRHOID SURGERY N/A 06/15/2015   Procedure: HEMORRHOIDECTOMY;  Surgeon: Stark Klein, MD;  Location: [facility];  Service: General;  Laterality: N/A;  . INGUINAL HERNIA REPAIR Right ~ 1996  . LEFT HEART CATHETERIZATION WITH CORONARY ANGIOGRAM N/A 06/21/2014   Procedure: LEFT HEART CATHETERIZATION WITH CORONARY ANGIOGRAM;  Surgeon: Larey Dresser, MD;  Location: [facility] CATH LAB;  Service: Cardiovascular;  Laterality: N/A;  . LOOP RECORDER INSERTION N/A 06/19/2016   Procedure: Loop Recorder Insertion;  Surgeon: Deboraha Sprang, MD;  Location: [facility] CV LAB;  Service: Cardiovascular;  Laterality: N/A;  . TEE WITHOUT CARDIOVERSION N/A 03/12/2016   Procedure: TRANSESOPHAGEAL ECHOCARDIOGRAM (TEE);  Surgeon: Sanda Klein, MD;  Location: [facility] ENDOSCOPY;  Service: Cardiovascular;  Laterality: N/A;     Medications Prior to Admission: Prior to Admission medications   Medication Sig Start Date End Date Taking? Authorizing Provider  acetaminophen (TYLENOL) 325 MG tablet Take 650 mg by mouth every 6 (six) hours as needed (for pain or headaches).    Yes [provider]  amLODipine (NORVASC) 10 MG tablet Take 10 mg by mouth daily.    Yes [provider]  atorvastatin (LIPITOR) 20 MG  tablet Take 1 tablet (20 mg total) by mouth daily. 03/18/16  Yes Hilty, Nadean Corwin, MD  cloNIDine (CATAPRES - DOSED IN MG/24 HR) 0.3 mg/24hr patch Place 1 patch (0.3 mg total) onto the skin once a week. Patient taking differently: Place 0.3 mg onto the skin every Friday.  08/23/17  Yes Evans Lance, MD  gabapentin (NEURONTIN) 300 MG capsule Take 2 capsules (600 mg total) by mouth 3 (three) times daily. 06/22/17  Yes Clent Demark, PA-C  labetalol (NORMODYNE) 300 MG tablet Take 1 tablet (300 mg total) by mouth daily. 06/22/17  Yes Clent Demark, PA-C  lisinopril (PRINIVIL,ZESTRIL) 30 MG tablet Take 30 mg by mouth daily.  09/15/17  Yes [provider]  amitriptyline (ELAVIL) 25 MG tablet Take 1 tablet (25 mg total) by mouth at bedtime. Patient not taking: Reported on 12/02/2017 06/22/17   Clent Demark, PA-C     Allergies:    Allergies  Allergen Reactions  . Imdur [Isosorbide Nitrate] Other (See Comments)    Headaches   .  Tramadol Other (See Comments)    Headaches     Social History:   Social History   Socioeconomic History  . Marital status: Married    Spouse name: Not on file  . Number of children: 5  . Years of education: 82  . Highest education level: Not on file  Occupational History  . Occupation: Disabled, part-time Programmer, systems    Comment: Disability  Social Needs  . Financial resource strain: Not on file  . Food insecurity:    Worry: Not on file    Inability: Not on file  . Transportation needs:    Medical: Not on file    Non-medical: Not on file  Tobacco Use  . Smoking status: Current Some Day Smoker    Packs/day: 0.25    Years: 23.00    Pack years: 5.75    Types: Cigarettes  . Smokeless tobacco: Never Used  . Tobacco comment: 3 to 4 per day  Substance and Sexual Activity  . Alcohol use: Yes    Alcohol/week: 1.0 standard drinks    Types: 1 Cans of beer per week    Comment: occasionally  . Drug use: Yes    Types: Marijuana    Comment: 2  times/week  . Sexual activity: Yes  Lifestyle  . Physical activity:    Days per week: Not on file    Minutes per session: Not on file  . Stress: Not on file  Relationships  . Social connections:    Talks on phone: Not on file    Gets together: Not on file    Attends religious service: Not on file    Active member of club or organization: Not on file    Attends meetings of clubs or organizations: Not on file    Relationship status: Not on file  . Intimate partner violence:    Fear of current or ex partner: Not on file    Emotionally abused: Not on file    Physically abused: Not on file    Forced sexual activity: Not on file  Other Topics Concern  . Not on file  Social History Narrative   Fun: Enjoy his children    Family History:   The patient's family history includes Colon cancer in his paternal uncle; Diabetes in his maternal aunt; Headache in his sister; Heart attack in his brother; Hypertension in his mother and unknown relative; Lung cancer in his maternal uncle; Other in his sister; Stroke in his maternal aunt; Thyroid disease in his sister. There is no history of Throat cancer, Pancreatic cancer, Esophageal cancer, Kidney disease, or Liver disease.    ROS:  Patient describes productive cough that is chronic.  No hemoptysis. No melena or hematuria.  All other ROS reviewed and negative.     Physical Exam/Data:   Vitals:   12/02/17 1615 12/02/17 1630 12/02/17 1645 12/02/17 1825  BP: (!) 193/106 (!) 198/104 (!) 183/91 (!) 191/99  Pulse: (!) 56 81 70 60  Resp: 14 (!) 24 17 19   Temp:      TempSrc:      SpO2: 96% 97% 97% 97%  Weight:      Height:       No intake or output data in the 24 hours ending 12/02/17 1831 Filed Weights   12/02/17 1440  Weight: 68.9 kg   Body mass index is 20.61 kg/m.  General:  Well nourished, well developed, in no acute distress HEENT: normal Lymph: no adenopathy Neck: no JVD Endocrine:  No thryomegaly Vascular: No carotid bruits; FA  pulses 2+ bilaterally without bruits  Cardiac:  normal S1, S2; RRR; 2/6 systolic murmur LSB, no DM Lungs:  clear to auscultation bilaterally, no wheezing, rhonchi or rales Prior sternotomy Abd: soft, nontender, no hepatomegaly  Ext: no edema Musculoskeletal:  No deformities, BUE and BLE strength normal and equal Skin: warm and dry  Neuro:  CNs 2-12 intact, no focal abnormalities noted Psych:  Normal affect    EKG:  The ECG personally reviewed and demonstrates NSR, LVH with repolarization abnormality, biatrial enlargement  Laboratory Data:  Chemistry Recent Labs  Lab 12/02/17 1457  NA 139  K 4.0  CL 108  CO2 24  GLUCOSE 105*  BUN 18  CREATININE 2.07*  CALCIUM 8.9  GFRNONAA 37*  GFRAA 43*  ANIONGAP 7    Recent Labs  Lab 12/02/17 1457  PROT 6.6  ALBUMIN 3.4*  AST 18  ALT 10  ALKPHOS 83  BILITOT 0.8   Hematology Recent Labs  Lab 12/02/17 1457  WBC 5.2  RBC 4.24  HGB 13.2  HCT 41.4  MCV 97.6  MCH 31.1  MCHC 31.9  RDW 13.7  PLT 112*    Recent Labs  Lab 12/02/17 1505  TROPIPOC 1.69*    Radiology/Studies:  Ct Head Wo Contrast  Result Date: 12/02/2017 CLINICAL DATA:  Chest pain for 2 days with nausea and headache. EXAM: CT HEAD WITHOUT CONTRAST TECHNIQUE: Contiguous axial images were obtained from the base of the skull through the vertex without intravenous contrast. COMPARISON:  Brain MRI 03/03/2016 FINDINGS: Brain: There is a chronic moderate-sized left MCA infarct in the parietal lobe, acute on the prior MRI. There is no evidence of acute infarct, intracranial hemorrhage, mass, midline shift, or extra-axial fluid collection. Patchy cerebral white matter hypodensities are most conspicuous in the subcortical frontal white matter, nonspecific but compatible with chronic small vessel ischemic disease. The ventricles are normal. Vascular: No hyperdense vessel. Vertebrobasilar dolichoectasia. Skull: No fracture or focal osseous lesion. Sinuses/Orbits: Moderate  bilateral ethmoid, left frontal, and left sphenoid sinus mucosal thickening. Clear mastoid air cells. Unremarkable included orbits. Other: None. IMPRESSION: 1. No evidence of acute intracranial abnormality. 2. Chronic left MCA infarct. Electronically Signed   By: Logan Bores M.D.   On: 12/02/2017 16:41   Dg Chest Port 1 View  Result Date: 12/02/2017 CLINICAL DATA:  Constant chest pressure for 2 days. Shortness of breath. EXAM: PORTABLE CHEST 1 VIEW COMPARISON:  Chest radiograph September 30, 2017 FINDINGS: Stable cardiomegaly. Aortic stent graft. Status post median sternotomy, cardiac valve replacement. Loop monitor projects LEFT chest. No pleural effusion or focal consolidation. LEFT lung base scarring with pleural thickening. Surgical clips project RIGHT axilla. Osseous structures are normal. IMPRESSION: Stable cardiomegaly. No acute pulmonary process. Stable LEFT lung base pleural thickening and scarring. Electronically Signed   By: Elon Alas M.D.   On: 12/02/2017 16:06   Ct Angio Chest/abd/pel For Dissection W And/or W/wo  Result Date: 12/02/2017 CLINICAL DATA:  Chest pain radiating into left chest with nausea and headache. Prior Bentall procedure with arch replacement and debranching followed by TEVAR to treat chronic type B aortic dissection. Status post more recent thoracoabdominal aortic repair and placement of multi branch graft to the celiac, SMA and bilateral renal arteries in October, 2018. All of the vascular procedures were performed at [facility]. EXAM: CT ANGIOGRAPHY CHEST, ABDOMEN AND PELVIS TECHNIQUE: Multidetector CT imaging through the chest, abdomen and pelvis was performed using the standard protocol during bolus administration  of intravenous contrast. Multiplanar reconstructed images and MIPs were obtained and reviewed to evaluate the vascular anatomy. CONTRAST:  114mL ISOVUE-370 IOPAMIDOL (ISOVUE-370) INJECTION 76% COMPARISON:  10/01/2017 as well as multiple prior studies.  FINDINGS: CTA CHEST FINDINGS Cardiovascular: Prosthetic aortic valve shows stable appearance. The aortic root demonstrates stable diameter of approximately 4.3 cm. Ascending aortic graft and thoracic endograft shows stable appearance and patency. On the arterial phase of imaging, no endoleak or dissection is identified. No evidence of intramural aortic hemorrhage. Elephant graft supplying the great vessels shows normal and stable patency with normal patency noted of the visualized proximal great vessels. Stable heart size with evidence of stable left ventricular hypertrophy. No pericardial fluid identified. The pulmonary arteries are not adequately opacified to assess for pulmonary embolism. Central pulmonary arteries are normal in caliber. Mediastinum/Nodes: No enlarged mediastinal, hilar, or axillary lymph nodes. Thyroid gland, trachea, and esophagus demonstrate no significant findings. Lungs/Pleura: Lungs show stable mild scarring at the left base. There is no evidence of pulmonary edema, consolidation, pneumothorax, nodule or pleural fluid. Musculoskeletal: No chest wall abnormality. No acute or significant osseous findings. Review of the MIP images confirms the above findings. CTA ABDOMEN AND PELVIS FINDINGS VASCULAR Aorta: Stable appearance of the proximal abdominal aorta status post graft placement with stable grafts to the celiac axis, superior mesenteric artery and bilateral renal arteries. All of the visceral arteries are normally patent. The aneurysmal portion of the native aorta previously representing a patent false lumen after aortic dissection remains successfully excluded and thrombosed. Aortic diameter at this level is approximately 4.6 cm including the thrombosed excluded aorta. The distal aorta is normally patent. No evidence of acute dissection. Celiac: Normally patent. SMA: Normally patent. Renals: Normally patent. IMA: Normally patent. Inflow: Stable aneurysmal disease of the common iliac  arteries with maximal caliber of 2 cm on the left and 1.6 cm on the right. Internal and external iliac arteries show stable and normal patency. The common femoral arteries and femoral bifurcations are normally patent. Review of the MIP images confirms the above findings. NON-VASCULAR Hepatobiliary: No focal liver abnormality is seen. No gallstones, gallbladder wall thickening, or biliary dilatation. Pancreas: Unremarkable. No pancreatic ductal dilatation or surrounding inflammatory changes. Spleen: Normal in size without focal abnormality. Adrenals/Urinary Tract: Adrenal glands are unremarkable. Kidneys are normal, without renal calculi, focal lesion, or hydronephrosis. Bladder is unremarkable. Stomach/Bowel: No bowel abnormalities identified on the arterial phase study without oral contrast. No free intraperitoneal air identified. Lymphatic: No enlarged lymph nodes identified. Reproductive: Prostate is unremarkable. Other: No abdominal wall hernia or abnormality. No abdominopelvic ascites. Musculoskeletal: No acute or significant osseous findings. Review of the MIP images confirms the above findings. IMPRESSION: Stable postoperative appearance of the thoracic and abdominal aorta after prior extensive procedures including aortic valve replacement, aortic root replacement, debranching of the great vessels with elephant graft, TEVAR and thoracoabdominal aortic repair with grafting to the celiac axis, SMA and bilateral renal arteries. No evidence of acute dissection, hemorrhage or other acute findings demonstrated. Electronically Signed   By: Aletta Edouard M.D.   On: 12/02/2017 17:15    Assessment and Plan:   1. Non-ST elevation myocardial infarction-patient presents with chest pain that is very atypical.  It has been chronic and intermittent since his surgery in 2016.  Cardiac catheterization at that time did not reveal obstructive coronary disease.  However his troponin is elevated.  We will admit to stepdown  unit.  Continue to cycle enzymes.  Treat with aspirin and continue statin and  labetalol.  Add IV heparin.  We will need to make a decision concerning further cardiac work-up based on follow-up enzymes.  If they are indeed elevated he will likely require cardiac catheterization.  However he has baseline renal insufficiency with a creatinine of 2 and had a CTA to evaluate his previous aortic root repair in the emergency room.  I am concerned about the risk of contrast nephropathy and therefore would like to delay catheterization if possible.  Arrange echocardiogram for LV function.  Note ECG unchanged compared to previous. 2. Chest pain-follow-up CTA of thoracic aorta shows prior repair intact. 3. Hypertension-patient with severely elevated blood pressure at time of admission.  Would resume amlodipine, clonidine patch and labetalol.  Hold ACE inhibitor in setting of baseline renal insufficiency and contrast.  Follow blood pressure and adjust regimen as needed. 4. Hyperlipidemia-continue statin. 5. Tobacco abuse-patient counseled on discontinuing. 6. Chronic stage III kidney disease-patient received IV contrast in the emergency room.  Will hydrate gently with normal saline at 75 cc/h.  Follow renal function closely.  For questions or updates, please contact [place] Please consult [email or web address] for contact info under        Signed, Kirk Ruths, MD  12/02/2017 6:31 PM

## 2017-12-02 NOTE — ED Triage Notes (Addendum)
Pt has loop recorder implanted. Reports chest pain 2 days ago radiating to left side of chest. Endorses SOB, headache, nausea.

## 2017-12-02 NOTE — ED Provider Notes (Signed)
[facility] EMERGENCY DEPARTMENT Provider Note   CSN: [id number] Arrival date & time: 12/02/17  1431     History   Chief Complaint Chief Complaint  Patient presents with  . Chest Pain    HPI [name] is a 47 y.o. male.  The history is provided by the patient. No language interpreter was used.  Chest Pain     [name] is a 47 y.o. male who presents to the Emergency Department complaining of chest pain. He presents to the emergency department complaining of chest pain that began two days ago. It is located in his left chest and described as burning and pressure type sensation. It is constant in nature and radiates to the right chest. The pain is worse with movement and worse at night. He reports associated headache that is been present for 2 to 4 days. He denies any nausea, cough, fevers, vomiting. He has chronic intermittent abdominal pain since his aortic repair. His abdominal pain is unchanged from baseline. He also endorses intermittent shortness of breath. Denies any diaphoresis. He is compliant with his blood pressure medications but states that he has had trouble with uncontrolled hypertension for the last several months. He does not take any blood thinners. He states that his pain is similar to his prior dissection pain but less intense. Past Medical History:  Diagnosis Date  . Adenomatous colon polyp 08/2015  . Anxiety   . Aortic disease ([place])   . Aortic dissection (HCC)    a. admx 04/2014 >> L renal infarct; a/c renal failure >> b.  s/p Bioprosthetic Bentall and total arch replacement and staged endovascular repair of descending aortic aneurysm ([facility] - Dr. [name])  . CAD (coronary artery disease)    a. LHC 4/16:  oD1 60%  . Cardiomyopathy ([place])    a. non-ischemic - probably related to untreated HTN and ETOH abuse - Echo 3/13 with EF 35-40% >> b. Echo 4/16: Severe LVH, EF 55-60%, moderate AI, moderate MR, mild LAE, trivial effusion, known type B  dissection with communication between true and false lumens with suprasternal images suggesting dissection plane may propagate to at least left subclavian takeoff, root above aortic valve okay    . Chronic abdominal pain   . Chronic combined systolic and diastolic congestive heart failure ([place])    a. 05/2011: Adm with pulm edema/HTN urgency, EF 35-40% with diffuse hypokinesis and moderate to severe mitral regurgitation. Cardiomyopathy likely due to uncontrolled HTN and ETOH abuse - cath deferred due to renal insufficiency (felt due to uncontrolled HTN). bJodie Echevaria MV 06/2011: EF 37% and no ischemia or infarction. c. EF 45-50% by echo 01/2012.  [name] Chronic sinusitis   . CKD (chronic kidney disease)    a. 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  peak creatinine 3.46 during admx for aortic dissection 2/16.  sees Dr [name]  . Colon polyps 09/11/2015   Descending, sigmoid polyps  . DDD (degenerative disc disease), lumbar   . Depression   . Descending thoracic aortic aneurysm ([place])   . Dissecting aneurysm of thoracic aorta ([place])   . ETOH abuse    a. Reported to have quit 05/2011.  . Frequent headaches   . GERD (gastroesophageal reflux disease)   . Headache(784.0)    "q other day" (08/08/2013)  . Heart murmur   . Hemorrhoid thrombosis   . History of echocardiogram    Echo 1/17:  Severe LVH, EF 55-60%, no RWMA, Gr 2 DD, AVR ok, mild to mod  MR, mild LAE, mild reduced RVSF, mod RAE  . History of medication noncompliance   . HYPERLIPIDEMIA   . Hypertension    a. Hx of HTN urgency secondary to noncompliance. b. urinary metanephrine and catecholeamine levels normal 2013.  c. Renal art [name] 1/16:  No evidence of renal artery stenosis noted bilaterally.  . INGUINAL HERNIA   . Pneumonia ~ 2013  . Renal insufficiency   . Stroke ([place])   . Tobacco abuse   . Valvular heart disease    a. Echo 05/2011: moderate to severe eccentric MR and mild to moderate AI with prolapsing left coronary cusp. b. Echo 01/2012: mild-mod AI,  mild dilitation of aortic root, mild MR.;  c. Echo 1/16: Severe LVH consistent with hypertrophic cardio myopathy, EF 50%, no RWMA, mod AI, mild MR, mild RAE, dilated Ao root (40 mm);     Patient Active Problem List   Diagnosis Date Noted  . NSTEMI (non-ST elevated myocardial infarction) ([place]) 12/02/2017  . Hypertensive emergency 12/02/2017  . History of stroke 10/05/2017  . Somnolence, daytime 04/07/2017  . Snoring 04/07/2017  . Cerebral infarction due to embolism of left middle cerebral artery ([place]) 06/03/2016  . History of aortic dissection 06/03/2016  . Palpitation   . Cerebrovascular accident (CVA) due to embolism of left middle cerebral artery ([place])   . Seizures ([place]) 02/28/2016  . HLD (hyperlipidemia) 08/28/2015  . 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[place]) 06/07/2015  . Cardiomyopathy ([place]) 03/01/2015  . Acid reflux 03/01/2015  . Essential hypertension 03/01/2015  . Cardiac chest pain 12/10/2014  . Abdominal pain 10/12/2014  . S/P aortic dissection repair 10/03/2014  . H/O aortic valve replacement 10/03/2014  . CKD (chronic kidney disease) 05/15/2014  . AKI (acute kidney injury) ([place])   . Aortic dissection, thoracoabdominal ([place])   . 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 Aortic dissection, thoracic ([place]) 04/24/2014  . Aortic dissection ([place]) 04/23/2014  . Aneurysm of ascending aorta (HCC) 03/28/2014  . Dyspnea 03/27/2014  . ETOH abuse 09/20/2013  . Malignant hypertension 02/20/2013  . Hypertensive urgency 01/27/2012  . Cardiomyopathy, hypertensive ([place]) 01/26/2012  . AI (aortic insufficiency) 01/26/2012  . MR (mitral regurgitation) 01/26/2012  . Acute renal failure superimposed on stage 3 chronic kidney disease ([place]) 01/26/2012  . Systolic CHF, chronic ([place]) 06/10/2011  . Chronic bronchitis 05/23/2011  . Cardiomegaly - hypertensive 05/22/2011  . Marijuana use 05/22/2011  . Abdominal pain 05/22/2011  . Hypertension, malignant  05/22/2011  . Anxiety and depression 05/22/2011  . Hyperlipidemia LDL goal <70 08/26/2009  . Tobacco use 08/26/2009  . Headache(784.0) 08/26/2009  . Aortic valve disorder 03/11/2009  . INGUINAL HERNIA 02/18/2009  . Uncontrolled hypertension 01/16/2009    Past Surgical History:  Procedure Laterality Date  . ANKLE SURGERY Bilateral    Fractures bilaterally  . AORTIC VALVE SURGERY  09/2014  . FOOT FRACTURE SURGERY Bilateral 2004-2010   "got pins in both of them"  . HEMORRHOID SURGERY N/A 06/15/2015   Procedure: HEMORRHOIDECTOMY;  Surgeon: Stark Klein, MD;  Location: [facility];  Service: General;  Laterality: N/A;  . INGUINAL HERNIA REPAIR Right ~ 1996  . LEFT HEART CATHETERIZATION WITH CORONARY ANGIOGRAM N/A 06/21/2014   Procedure: LEFT HEART CATHETERIZATION WITH CORONARY ANGIOGRAM;  Surgeon: Larey Dresser, MD;  Location: [facility] CATH LAB;  Service: Cardiovascular;  Laterality: N/A;  . LOOP RECORDER INSERTION N/A 06/19/2016   Procedure: Loop Recorder Insertion;  Surgeon: Deboraha Sprang, MD;  Location: [facility] CV LAB;  Service: Cardiovascular;  Laterality: N/A;  . TEE WITHOUT CARDIOVERSION N/A 03/12/2016   Procedure: TRANSESOPHAGEAL ECHOCARDIOGRAM (TEE);  Surgeon: Sanda Klein, MD;  Location: [facility] ENDOSCOPY;  Service: Cardiovascular;  Laterality: N/A;        Home Medications    Prior to Admission medications   Medication Sig Start Date End Date Taking? Authorizing Provider  acetaminophen (TYLENOL) 325 MG tablet Take 650 mg by mouth every 6 (six) hours as needed (for pain or headaches).    Yes [provider]  amLODipine (NORVASC) 10 MG tablet Take 10 mg by mouth daily.    Yes [provider]  atorvastatin (LIPITOR) 20 MG tablet Take 1 tablet (20 mg total) by mouth daily. 03/18/16  Yes Hilty, Nadean Corwin, MD  cloNIDine (CATAPRES - DOSED IN MG/24 HR) 0.3 mg/24hr patch Place 1 patch (0.3 mg total) onto the skin once a week. Patient taking differently: Place 0.3 mg onto the skin  every Friday.  08/23/17  Yes Evans Lance, MD  gabapentin (NEURONTIN) 300 MG capsule Take 2 capsules (600 mg total) by mouth 3 (three) times daily. 06/22/17  Yes Clent Demark, PA-C  labetalol (NORMODYNE) 300 MG tablet Take 1 tablet (300 mg total) by mouth daily. 06/22/17  Yes Clent Demark, PA-C  lisinopril (PRINIVIL,ZESTRIL) 30 MG tablet Take 30 mg by mouth daily.  09/15/17  Yes [provider]    Family History Family History  Problem Relation Age of Onset  . Hypertension Mother   . Hypertension Unknown   . Colon cancer Paternal Uncle   . Stroke Maternal Aunt   . Heart attack Brother   . Diabetes Maternal Aunt   . Lung cancer Maternal Uncle   . Other Sister        "breathing machine at night"  . Headache Sister   . Thyroid disease Sister   . Throat cancer Neg Hx   . Pancreatic cancer Neg Hx   . Esophageal cancer Neg Hx   . Kidney disease Neg Hx   . Liver disease Neg Hx     Social History Social History   Tobacco Use  . Smoking status: Current Some Day Smoker    Packs/day: 0.25    Years: 23.00    Pack years: 5.75    Types: Cigarettes  . Smokeless tobacco: Never Used  . Tobacco comment: 3 to 4 per day  Substance Use Topics  . Alcohol use: Yes    Alcohol/week: 1.0 standard drinks    Types: 1 Cans of beer per week    Comment: occasionally  . Drug use: Yes    Types: Marijuana    Comment: 2 times/week     Allergies   Imdur [isosorbide nitrate] and Tramadol   Review of Systems Review of Systems  Cardiovascular: Positive for chest pain.  All other systems reviewed and are negative.    Physical Exam Updated Vital Signs BP (!) 159/84   Pulse 70   Temp 99 F (37.2 C) (Oral)   Resp (!) 27   Ht 6' (1.829 m)   Wt 69.1 kg   SpO2 98%   BMI 20.66 kg/m   Physical Exam  Constitutional: He is oriented to person, place, and time. He appears well-developed and well-nourished.  HENT:  Head: Normocephalic and atraumatic.  Cardiovascular: Normal  rate and regular rhythm.  Systolic ejection murmur with click  Pulmonary/Chest: Effort normal and breath sounds normal. No respiratory distress. He exhibits no tenderness.  Abdominal: Soft. There is no rebound  and no guarding.  Mild generalized abdominal tenderness  Musculoskeletal: He exhibits no edema or tenderness.  2+ radial pulses bilaterally. 2+ DP pulses bilaterally.  Neurological: He is alert and oriented to person, place, and time.  Five out of five strength in all four extremities. No facial asymmetry. Altered sensation to light touch over the right hand and forearm(patient states this is chronic).  Skin: Skin is warm and dry.  Psychiatric: He has a normal mood and affect. His behavior is normal.  Nursing note and vitals reviewed.    ED Treatments / Results  Labs (all labs ordered are listed, but only abnormal results are displayed) Labs Reviewed  COMPREHENSIVE METABOLIC PANEL - Abnormal; Notable for the following components:      Result Value   Glucose, Bld 105 (*)    Creatinine, Ser 2.07 (*)    Albumin 3.4 (*)    GFR calc non Af Amer 37 (*)    GFR calc Af Amer 43 (*)    All other components within normal limits  CBC WITH DIFFERENTIAL/PLATELET - Abnormal; Notable for the following components:   Platelets 112 (*)    All other components within normal limits  TROPONIN I - Abnormal; Notable for the following components:   Troponin I 2.14 (*)    All other components within normal limits  I-STAT TROPONIN, ED - Abnormal; Notable for the following components:   Troponin i, poc 1.69 (*)    All other components within normal limits  MRSA PCR SCREENING  HEPARIN LEVEL (UNFRACTIONATED)  CBC  HIV ANTIBODY (ROUTINE TESTING W REFLEX)  BASIC METABOLIC PANEL    EKG EKG Interpretation  Date/Time:  Thursday December 02 2017 14:40:47 EDT Ventricular Rate:  74 PR Interval:  154 QRS Duration: 96 QT Interval:  430 QTC Calculation: 477 R Axis:   63 Text Interpretation:   Normal sinus rhythm Biatrial enlargement Left ventricular hypertrophy with repolarization abnormality Abnormal ECG No significant change since last tracing Confirmed by Quintella Reichert [phone]) on 12/02/2017 3:23:34 PM   Radiology Ct Head Wo Contrast  Result Date: 12/02/2017 CLINICAL DATA:  Chest pain for 2 days with nausea and headache. EXAM: CT HEAD WITHOUT CONTRAST TECHNIQUE: Contiguous axial images were obtained from the base of the skull through the vertex without intravenous contrast. COMPARISON:  Brain MRI 03/03/2016 FINDINGS: Brain: There is a chronic moderate-sized left MCA infarct in the parietal lobe, acute on the prior MRI. There is no evidence of acute infarct, intracranial hemorrhage, mass, midline shift, or extra-axial fluid collection. Patchy cerebral white matter hypodensities are most conspicuous in the subcortical frontal white matter, nonspecific but compatible with chronic small vessel ischemic disease. The ventricles are normal. Vascular: No hyperdense vessel. Vertebrobasilar dolichoectasia. Skull: No fracture or focal osseous lesion. Sinuses/Orbits: Moderate bilateral ethmoid, left frontal, and left sphenoid sinus mucosal thickening. Clear mastoid air cells. Unremarkable included orbits. Other: None. IMPRESSION: 1. No evidence of acute intracranial abnormality. 2. Chronic left MCA infarct. Electronically Signed   By: Logan Bores M.D.   On: 12/02/2017 16:41   Dg Chest Port 1 View  Result Date: 12/02/2017 CLINICAL DATA:  Constant chest pressure for 2 days. Shortness of breath. EXAM: PORTABLE CHEST 1 VIEW COMPARISON:  Chest radiograph September 30, 2017 FINDINGS: Stable cardiomegaly. Aortic stent graft. Status post median sternotomy, cardiac valve replacement. Loop monitor projects LEFT chest. No pleural effusion or focal consolidation. LEFT lung base scarring with pleural thickening. Surgical clips project RIGHT axilla. Osseous structures are normal. IMPRESSION: Stable cardiomegaly. No acute  pulmonary process. Stable LEFT lung base pleural thickening and scarring. Electronically Signed   By: Elon Alas M.D.   On: 12/02/2017 16:06   Ct Angio Chest/abd/pel For Dissection W And/or W/wo  Result Date: 12/02/2017 CLINICAL DATA:  Chest pain radiating into left chest with nausea and headache. Prior Bentall procedure with arch replacement and debranching followed by TEVAR to treat chronic type B aortic dissection. Status post more recent thoracoabdominal aortic repair and placement of multi branch graft to the celiac, SMA and bilateral renal arteries in October, 2018. All of the vascular procedures were performed at [facility]. EXAM: CT ANGIOGRAPHY CHEST, ABDOMEN AND PELVIS TECHNIQUE: Multidetector CT imaging through the chest, abdomen and pelvis was performed using the standard protocol during bolus administration of intravenous contrast. Multiplanar reconstructed images and MIPs were obtained and reviewed to evaluate the vascular anatomy. CONTRAST:  129mL ISOVUE-370 IOPAMIDOL (ISOVUE-370) INJECTION 76% COMPARISON:  10/01/2017 as well as multiple prior studies. FINDINGS: CTA CHEST FINDINGS Cardiovascular: Prosthetic aortic valve shows stable appearance. The aortic root demonstrates stable diameter of approximately 4.3 cm. Ascending aortic graft and thoracic endograft shows stable appearance and patency. On the arterial phase of imaging, no endoleak or dissection is identified. No evidence of intramural aortic hemorrhage. Elephant graft supplying the great vessels shows normal and stable patency with normal patency noted of the visualized proximal great vessels. Stable heart size with evidence of stable left ventricular hypertrophy. No pericardial fluid identified. The pulmonary arteries are not adequately opacified to assess for pulmonary embolism. Central pulmonary arteries are normal in caliber. Mediastinum/Nodes: No enlarged mediastinal, hilar, or axillary lymph nodes. Thyroid gland, trachea,  and esophagus demonstrate no significant findings. Lungs/Pleura: Lungs show stable mild scarring at the left base. There is no evidence of pulmonary edema, consolidation, pneumothorax, nodule or pleural fluid. Musculoskeletal: No chest wall abnormality. No acute or significant osseous findings. Review of the MIP images confirms the above findings. CTA ABDOMEN AND PELVIS FINDINGS VASCULAR Aorta: Stable appearance of the proximal abdominal aorta status post graft placement with stable grafts to the celiac axis, superior mesenteric artery and bilateral renal arteries. All of the visceral arteries are normally patent. The aneurysmal portion of the native aorta previously representing a patent false lumen after aortic dissection remains successfully excluded and thrombosed. Aortic diameter at this level is approximately 4.6 cm including the thrombosed excluded aorta. The distal aorta is normally patent. No evidence of acute dissection. Celiac: Normally patent. SMA: Normally patent. Renals: Normally patent. IMA: Normally patent. 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 Internal and external iliac arteries show stable and normal patency. The common femoral arteries and femoral bifurcations are normally patent. Review of the MIP images confirms the above findings. NON-VASCULAR Hepatobiliary: No focal liver abnormality is seen. No gallstones, gallbladder wall thickening, or biliary dilatation. Pancreas: Unremarkable. No pancreatic ductal dilatation or surrounding inflammatory changes. Spleen: Normal in size without focal abnormality. Adrenals/Urinary Tract: Adrenal glands are unremarkable. Kidneys are normal, without renal calculi, focal lesion, or hydronephrosis. Bladder is unremarkable. Stomach/Bowel: No bowel abnormalities identified on the arterial phase study without oral contrast. No free intraperitoneal air identified. Lymphatic:  No enlarged lymph nodes identified. Reproductive: Prostate is unremarkable. Other: No abdominal wall hernia or abnormality. No abdominopelvic ascites. Musculoskeletal: No acute or significant osseous findings. Review of the MIP images confirms the above findings. IMPRESSION: Stable postoperative appearance of the thoracic and abdominal aorta after prior extensive  procedures including aortic valve replacement, aortic root replacement, debranching of the great vessels with elephant graft, TEVAR and thoracoabdominal aortic repair with grafting to the celiac axis, SMA and bilateral renal arteries. No evidence of acute dissection, hemorrhage or other acute findings demonstrated. Electronically Signed   By: Aletta Edouard M.D.   On: 12/02/2017 17:15    Procedures Procedures (including critical care time) CRITICAL CARE Performed by: Quintella Reichert   Total critical care time: 45 minutes  Critical care time was exclusive of separately billable procedures and treating other patients.  Critical care was necessary to treat or prevent imminent or life-threatening deterioration.  Critical care was time spent personally by me on the following activities: development of treatment plan with patient and/or surrogate as well as nursing, discussions with consultants, evaluation of patient's response to treatment, examination of patient, obtaining history from patient or surrogate, ordering and performing treatments and interventions, ordering and review of laboratory studies, ordering and review of radiographic studies, pulse oximetry and re-evaluation of patient's condition.  Medications Ordered in ED Medications  iopamidol (ISOVUE-370) 76 % injection (has no administration in time range)  acetaminophen (TYLENOL) tablet 650 mg (650 mg Oral Given 12/02/17 2031)  amLODipine (NORVASC) tablet 10 mg (has no administration in time range)  atorvastatin (LIPITOR) tablet 20 mg (has no administration in time range)    cloNIDine (CATAPRES - Dosed in mg/24 hr) patch 0.3 mg (has no administration in time range)  labetalol (NORMODYNE) tablet 300 mg (has no administration in time range)  aspirin EC tablet 81 mg (has no administration in time range)  nitroGLYCERIN (NITROSTAT) SL tablet 0.4 mg (0.4 mg Sublingual Given 12/02/17 1957)  ondansetron (ZOFRAN) injection 4 mg (has no administration in time range)  0.9 %  sodium chloride infusion ( Intravenous Rate/Dose Verify 12/03/17 0000)  hydrALAZINE (APRESOLINE) injection 5 mg (has no administration in time range)  heparin ADULT infusion 100 units/mL (25000 units/29mL sodium chloride 0.45%) (800 Units/hr Intravenous Rate/Dose Verify 12/03/17 0000)  nitroGLYCERIN 50 mg in dextrose 5 % 250 mL (0.2 mg/mL) infusion (0 mcg/min Intravenous Stopped 12/02/17 2303)  clevidipine (CLEVIPREX) infusion 0.5 mg/mL (has no administration in time range)  fentaNYL (SUBLIMAZE) injection 100 mcg (100 mcg Intravenous Given 12/02/17 1552)  iopamidol (ISOVUE-370) 76 % injection 80 mL (100 mLs Intravenous Contrast Given 12/02/17 1611)  aspirin chewable tablet 324 mg (324 mg Oral Given 12/02/17 1956)  heparin bolus via infusion 4,000 Units (4,000 Units Intravenous Bolus from Bag 12/02/17 2004)     Initial Impression / Assessment and Plan / ED Course  I have reviewed the triage vital signs and the nursing notes.  Pertinent labs & imaging results that were available during my care of the patient were reviewed by me and considered in my medical decision making (see chart for details).     Patient with history of aortic aneurysm, status post repair here for evaluation of chest pain for the last two days. EKG without acute ischemic changes. He is markedly hypertensive on ED arrival. Concern for possible dissection or change in his aneurysm repair and an urgent CTA was obtained and he was placed on a carding drip. CTA without acute changes. Troponin is elevated concerning for acute cardiac event. He  was treated with aspirin. Cardiology consulted for further evaluation and treatment.  Final Clinical Impressions(s) / ED Diagnoses   Final diagnoses:  None    ED Discharge Orders    None       Quintella Reichert, MD 12/03/17 [phone]

## 2017-12-02 NOTE — Progress Notes (Signed)
ANTICOAGULATION CONSULT NOTE - Follow Up Consult  Pharmacy Consult for heparin Indication: chest pain/ACS  Allergies  Allergen Reactions  . Imdur [Isosorbide Nitrate] Other (See Comments)    Headaches   . Tramadol Other (See Comments)    Headaches     Patient Measurements: Height: 6' (182.9 cm) Weight: 152 lb (68.9 kg) IBW/kg (Calculated) : 77.6 Heparin Dosing Weight: 69  Vital Signs: Temp: 99 F (37.2 C) (09/19 1440) Temp Source: Oral (09/19 1440) BP: 161/90 (09/19 1845) Pulse Rate: 66 (09/19 1845)  Labs: Recent Labs    12/02/17 1457  HGB 13.2  HCT 41.4  PLT 112*  CREATININE 2.07*    Estimated Creatinine Clearance: 43.9 mL/min (A) (by C-G formula based on SCr of 2.07 mg/dL (H)).   Medications:    Assessment: 47 yo M with PMH of aortic dissection repair admitted for chest pain. Heparin started for ACS/NSTEMI. Not on anticoagulation PTA. H&H WNL , plts 112  Goal of Therapy:  Heparin level 0.3-0.7 units/ml Monitor platelets by anticoagulation protocol: Yes   Plan:  Heparin 4000 unit IV bolus, then 800 unit/hr IV infusion 6 hr heparin level  Daily heparin level, CBC, monitor for s/s of bleeding  Harrietta Guardian, PharmD PGY1 Pharmacy Resident 12/02/2017    8:53 PM

## 2017-12-03 ENCOUNTER — Other Ambulatory Visit: Payer: Self-pay

## 2017-12-03 ENCOUNTER — Inpatient Hospital Stay (HOSPITAL_COMMUNITY): Payer: Medicare Other

## 2017-12-03 ENCOUNTER — Ambulatory Visit (INDEPENDENT_AMBULATORY_CARE_PROVIDER_SITE_OTHER): Payer: Medicare Other | Admitting: *Deleted

## 2017-12-03 DIAGNOSIS — I639 Cerebral infarction, unspecified: Secondary | ICD-10-CM | POA: Diagnosis not present

## 2017-12-03 DIAGNOSIS — I340 Nonrheumatic mitral (valve) insufficiency: Secondary | ICD-10-CM

## 2017-12-03 LAB — HEPARIN LEVEL (UNFRACTIONATED)
Heparin Unfractionated: <0.1 IU/mL — ABNORMAL LOW (ref 0.30–0.70)
Heparin Unfractionated: 0.44 IU/mL (ref 0.30–0.70)
Heparin Unfractionated: 0.23 IU/mL — ABNORMAL LOW (ref 0.30–0.70)

## 2017-12-03 LAB — BASIC METABOLIC PANEL
ANION GAP: 11 (ref 5–15)
BUN: 15 mg/dL (ref 6–20)
CALCIUM: 8.8 mg/dL — AB (ref 8.9–10.3)
CHLORIDE: 103 mmol/L (ref 98–111)
CO2: 21 mmol/L — AB (ref 22–32)
Creatinine, Ser: 1.58 mg/dL — ABNORMAL HIGH (ref 0.61–1.24)
GFR calc Af Amer: 59 mL/min — ABNORMAL LOW (ref >60)
GFR, EST NON AFRICAN AMERICAN: 51 mL/min — AB (ref >60)
GLUCOSE: 82 mg/dL (ref 70–99)
POTASSIUM: 4.1 mmol/L (ref 3.5–5.1)
Sodium: 135 mmol/L (ref 135–145)

## 2017-12-03 LAB — CBC
HEMATOCRIT: 43.2 % (ref 39.0–52.0)
Hemoglobin: 15.5 g/dL (ref 13.0–17.0)
MCH: 33.8 pg (ref 26.0–34.0)
MCHC: 35.9 g/dL (ref 30.0–36.0)
MCV: 94.1 fL (ref 78.0–100.0)
Platelets: 130 10*3/uL — ABNORMAL LOW (ref 150–400)
RBC: 4.59 MIL/uL (ref 4.22–5.81)
RDW: 13.5 % (ref 11.5–15.5)
WBC: 6.2 10*3/uL (ref 4.0–10.5)

## 2017-12-03 LAB — MRSA PCR SCREENING: MRSA by PCR: NEGATIVE

## 2017-12-03 LAB — TROPONIN I
Troponin I: 2.39 ng/mL (ref <0.03)
Troponin I: 1.77 ng/mL (ref <0.03)

## 2017-12-03 LAB — HIV ANTIBODY (ROUTINE TESTING W REFLEX): HIV Screen 4th Generation wRfx: NONREACTIVE

## 2017-12-03 LAB — ECHOCARDIOGRAM COMPLETE
Height: 72 in
Weight: 2409.19 oz

## 2017-12-03 MED ORDER — OXYCODONE HCL 5 MG PO TABS
5.0000 mg | ORAL_TABLET | Freq: Once | ORAL | Status: AC
  Administered 2017-12-03: 5 mg via ORAL
  Filled 2017-12-03: qty 1

## 2017-12-03 MED ORDER — SODIUM CHLORIDE 0.9 % IV SOLN: 250.0000 mL | INTRAVENOUS | Status: DC | PRN

## 2017-12-03 MED ORDER — SODIUM CHLORIDE 0.9 % WEIGHT BASED INFUSION: 1.0000 mL/kg/h | INTRAVENOUS | Status: DC

## 2017-12-03 MED ORDER — CLEVIDIPINE BUTYRATE 0.5 MG/ML IV EMUL
0.0000 mg/h | INTRAVENOUS | Status: DC
  Administered 2017-12-03 (×2): 21 mg/h via INTRAVENOUS
  Administered 2017-12-03: 16 mg/h via INTRAVENOUS
  Administered 2017-12-03: 21 mg/h via INTRAVENOUS
  Filled 2017-12-03 (×5): qty 100

## 2017-12-03 MED ORDER — SODIUM CHLORIDE 0.9% FLUSH
3.0000 mL | Freq: Two times a day (BID) | INTRAVENOUS | Status: DC
  Administered 2017-12-04 – 2017-12-05 (×2): 3 mL via INTRAVENOUS

## 2017-12-03 MED ORDER — SODIUM CHLORIDE 0.9 % WEIGHT BASED INFUSION
3.0000 mL/kg/h | INTRAVENOUS | Status: DC
  Administered 2017-12-06: 3 mL/kg/h via INTRAVENOUS

## 2017-12-03 MED ORDER — ASPIRIN 81 MG PO CHEW
81.0000 mg | CHEWABLE_TABLET | ORAL | Status: AC
  Administered 2017-12-06: 81 mg via ORAL
  Filled 2017-12-03: qty 1

## 2017-12-03 MED ORDER — HEPARIN BOLUS VIA INFUSION
2000.0000 [IU] | Freq: Once | INTRAVENOUS | Status: AC
  Administered 2017-12-03: 2000 [IU] via INTRAVENOUS
  Filled 2017-12-03: qty 2000

## 2017-12-03 MED ORDER — LABETALOL HCL 200 MG PO TABS
400.0000 mg | ORAL_TABLET | Freq: Every day | ORAL | Status: DC
  Administered 2017-12-04 – 2017-12-05 (×2): 400 mg via ORAL
  Filled 2017-12-03 (×3): qty 2

## 2017-12-03 MED ORDER — SODIUM CHLORIDE 0.9% FLUSH: 3.0000 mL | INTRAVENOUS | Status: DC | PRN

## 2017-12-03 NOTE — Progress Notes (Signed)
ANTICOAGULATION CONSULT NOTE - Follow Up Consult  Pharmacy Consult for heparin Indication: chest pain/ACS  Labs: Recent Labs    12/02/17 1457 12/02/17 1757 12/03/17 0214  HGB 13.2  --  15.5  HCT 41.4  --  43.2  PLT 112*  --  130*  HEPARINUNFRC  --   --  <0.10*  CREATININE 2.07*  --   --   TROPONINI  --  2.14*  --     Assessment: 47yo male undetectable on heparin with initial dosing for CP.  Goal of Therapy:  Heparin level 0.3-0.7 units/ml   Plan:  Will rebolus with heparin 2000 units and increase heparin gtt by 4 units/kg/hr to 1100 units/hr and check level in 8 hours.    Wynona Neat, PharmD, BCPS  12/03/2017,3:12 AM

## 2017-12-03 NOTE — Progress Notes (Signed)
ANTICOAGULATION CONSULT NOTE - Follow Up Consult  Pharmacy Consult for heparin Indication: chest pain/ACS  Labs: Recent Labs    12/02/17 1457 12/02/17 1757 12/03/17 0214 12/03/17 0653 12/03/17 1214 12/03/17 2005  HGB 13.2  --  15.5  --   --   --   HCT 41.4  --  43.2  --   --   --   PLT 112*  --  130*  --   --   --   HEPARINUNFRC  --   --  <0.10*  --  0.44 0.23*  CREATININE 2.07*  --  1.58*  --   --   --   TROPONINI  --  2.14*  --  2.39* 1.77*  --     Assessment: 47 yo M with PMH of aortic dissection repair (bioprosthetic valve 04/2014, no PTA anticoagulation) admitted for chest pain. Pharmacy has been consulted for heparin dosing for NSTEMI. Troponins remain elevated but trending down.  Heparin level slightly below goal range.  Per RN, no issues with IV infusion.    Goal of Therapy:  Heparin level 0.3-0.7 units/ml Monitor platelets by anticoagulation protocol: Yes   Plan:  Increase IV heparin to 1250 units/hr. Daily HL, CBC while on heparin Monitor for s/sx bleeding  Marguerite Olea, [facility] Clinical Pharmacist Phone [phone]  12/03/2017 9:31 PM

## 2017-12-03 NOTE — Care Management Note (Signed)
Case Management Note  Patient Details  Name: HAKEEM FRAZZINI MRN: [id number] Date of Birth: 02-23-1971  Subjective/Objective:   From home, presents with NSTEMI, CKD 3, HTN, HLD, HA, per MD patient has baseline renal insuff and has received contrast for his CTA, plan for cath Monday conts with heparin drip.                 Action/Plan: NCM will follow for transition of care needs.   Expected Discharge Date:                  Expected Discharge Plan:  Home/Self Care  In-House Referral:     Discharge planning Services  CM Consult  Post Acute Care Choice:    Choice offered to:     DME Arranged:    DME Agency:     HH Arranged:    HH Agency:     Status of Service:  In process, will continue to follow  If discussed at Long Length of Stay Meetings, dates discussed:    Additional Comments:  Zenon Mayo, RN 12/03/2017, 1:27 PM

## 2017-12-03 NOTE — Progress Notes (Signed)
  Echocardiogram 2D Echocardiogram has been performed.  [name] [name] 12/03/2017, 3:05 PM

## 2017-12-03 NOTE — Progress Notes (Signed)
Progress Note  Patient Name: [name] Date of Encounter: 12/03/2017  Primary Cardiologist: Dr Aundra Dubin  Subjective   Complains of HA; no dyspnea or CP  Inpatient Medications    Scheduled Meds: . amLODipine  10 mg Oral Daily  . aspirin EC  81 mg Oral Daily  . atorvastatin  20 mg Oral q1800  . cloNIDine  0.3 mg Transdermal Q Fri  . labetalol  300 mg Oral Daily   Continuous Infusions: . sodium chloride 75 mL/hr at 12/03/17 0600  . clevidipine 21 mg/hr (12/03/17 0818)  . heparin 1,100 Units/hr (12/03/17 0600)  . nitroGLYCERIN Stopped (12/02/17 2303)   PRN Meds: acetaminophen, hydrALAZINE, nitroGLYCERIN, ondansetron (ZOFRAN) IV   Vital Signs    Vitals:   12/03/17 0530 12/03/17 0800 12/03/17 0838 12/03/17 0900  BP: (!) 165/88 (!) 167/83  (!) 165/83  Pulse: 88 84  88  Resp: 20 17  15   Temp:   98.8 F (37.1 C)   TempSrc:   Oral   SpO2: 95% 98%  94%  Weight:      Height:        Intake/Output Summary (Last 24 hours) at 12/03/2017 0933 Last data filed at 12/03/2017 0600 Gross per 24 hour  Intake 1085.85 ml  Output 950 ml  Net 135.85 ml   Filed Weights   12/02/17 1440 12/02/17 2218 12/03/17 0439  Weight: 68.9 kg 69.1 kg 68.3 kg    Telemetry    Sinus with PVCs- Personally Reviewed  Physical Exam   GEN: No acute distress.   Neck: No JVD Cardiac: RRR, 2/6 systolic murmur Respiratory: Clear to auscultation bilaterally. GI: Soft, nontender, non-distended  MS: No edema Neuro:  Nonfocal  Psych: Normal affect   Labs    Chemistry Recent Labs  Lab 12/02/17 1457 12/03/17 0214  NA 139 135  K 4.0 4.1  CL 108 103  CO2 24 21*  GLUCOSE 105* 82  BUN 18 15  CREATININE 2.07* 1.58*  CALCIUM 8.9 8.8*  PROT 6.6  --   ALBUMIN 3.4*  --   AST 18  --   ALT 10  --   ALKPHOS 83  --   BILITOT 0.8  --   GFRNONAA 37* 51*  GFRAA 43* 59*  ANIONGAP 7 11     Hematology Recent Labs  Lab 12/02/17 1457 12/03/17 0214  WBC 5.2 6.2  RBC 4.24 4.59  HGB 13.2 15.5    HCT 41.4 43.2  MCV 97.6 94.1  MCH 31.1 33.8  MCHC 31.9 35.9  RDW 13.7 13.5  PLT 112* 130*    Cardiac Enzymes Recent Labs  Lab 12/02/17 1757 12/03/17 0653  TROPONINI 2.14* 2.39*    Recent Labs  Lab 12/02/17 1505  TROPIPOC 1.69*     Radiology    Ct Head Wo Contrast  Result Date: 12/02/2017 CLINICAL DATA:  Chest pain for 2 days with nausea and headache. EXAM: CT HEAD WITHOUT CONTRAST TECHNIQUE: Contiguous axial images were obtained from the base of the skull through the vertex without intravenous contrast. COMPARISON:  Brain MRI 03/03/2016 FINDINGS: Brain: There is a chronic moderate-sized left MCA infarct in the parietal lobe, acute on the prior MRI. There is no evidence of acute infarct, intracranial hemorrhage, mass, midline shift, or extra-axial fluid collection. Patchy cerebral white matter hypodensities are most conspicuous in the subcortical frontal white matter, nonspecific but compatible with chronic small vessel ischemic disease. The ventricles are normal. Vascular: No hyperdense vessel. Vertebrobasilar dolichoectasia. Skull: No fracture or focal  osseous lesion. Sinuses/Orbits: Moderate bilateral ethmoid, left frontal, and left sphenoid sinus mucosal thickening. Clear mastoid air cells. Unremarkable included orbits. Other: None. IMPRESSION: 1. No evidence of acute intracranial abnormality. 2. Chronic left MCA infarct. Electronically Signed   By: Logan Bores M.D.   On: 12/02/2017 16:41   Dg Chest Port 1 View  Result Date: 12/02/2017 CLINICAL DATA:  Constant chest pressure for 2 days. Shortness of breath. EXAM: PORTABLE CHEST 1 VIEW COMPARISON:  Chest radiograph September 30, 2017 FINDINGS: Stable cardiomegaly. Aortic stent graft. Status post median sternotomy, cardiac valve replacement. Loop monitor projects LEFT chest. No pleural effusion or focal consolidation. LEFT lung base scarring with pleural thickening. Surgical clips project RIGHT axilla. Osseous structures are normal.  IMPRESSION: Stable cardiomegaly. No acute pulmonary process. Stable LEFT lung base pleural thickening and scarring. Electronically Signed   By: Elon Alas M.D.   On: 12/02/2017 16:06   Ct Angio Chest/abd/pel For Dissection W And/or W/wo  Result Date: 12/02/2017 CLINICAL DATA:  Chest pain radiating into left chest with nausea and headache. Prior Bentall procedure with arch replacement and debranching followed by TEVAR to treat chronic type B aortic dissection. Status post more recent thoracoabdominal aortic repair and placement of multi branch graft to the celiac, SMA and bilateral renal arteries in October, 2018. All of the vascular procedures were performed at [facility]. EXAM: CT ANGIOGRAPHY CHEST, ABDOMEN AND PELVIS TECHNIQUE: Multidetector CT imaging through the chest, abdomen and pelvis was performed using the standard protocol during bolus administration of intravenous contrast. Multiplanar reconstructed images and MIPs were obtained and reviewed to evaluate the vascular anatomy. CONTRAST:  116mL ISOVUE-370 IOPAMIDOL (ISOVUE-370) INJECTION 76% COMPARISON:  10/01/2017 as well as multiple prior studies. FINDINGS: CTA CHEST FINDINGS Cardiovascular: Prosthetic aortic valve shows stable appearance. The aortic root demonstrates stable diameter of approximately 4.3 cm. Ascending aortic graft and thoracic endograft shows stable appearance and patency. On the arterial phase of imaging, no endoleak or dissection is identified. No evidence of intramural aortic hemorrhage. Elephant graft supplying the great vessels shows normal and stable patency with normal patency noted of the visualized proximal great vessels. Stable heart size with evidence of stable left ventricular hypertrophy. No pericardial fluid identified. The pulmonary arteries are not adequately opacified to assess for pulmonary embolism. Central pulmonary arteries are normal in caliber. Mediastinum/Nodes: No enlarged mediastinal, hilar, or  axillary lymph nodes. Thyroid gland, trachea, and esophagus demonstrate no significant findings. Lungs/Pleura: Lungs show stable mild scarring at the left base. There is no evidence of pulmonary edema, consolidation, pneumothorax, nodule or pleural fluid. Musculoskeletal: No chest wall abnormality. No acute or significant osseous findings. Review of the MIP images confirms the above findings. CTA ABDOMEN AND PELVIS FINDINGS VASCULAR Aorta: Stable appearance of the proximal abdominal aorta status post graft placement with stable grafts to the celiac axis, superior mesenteric artery and bilateral renal arteries. All of the visceral arteries are normally patent. The aneurysmal portion of the native aorta previously representing a patent false lumen after aortic dissection remains successfully excluded and thrombosed. Aortic diameter at this level is approximately 4.6 cm including the thrombosed excluded aorta. The distal aorta is normally patent. No evidence of acute dissection. Celiac: Normally patent. SMA: Normally patent. Renals: Normally patent. IMA: Normally patent. 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 Internal and external iliac arteries show stable and normal patency. The common femoral arteries and femoral bifurcations are  normally patent. Review of the MIP images confirms the above findings. NON-VASCULAR Hepatobiliary: No focal liver abnormality is seen. No gallstones, gallbladder wall thickening, or biliary dilatation. Pancreas: Unremarkable. No pancreatic ductal dilatation or surrounding inflammatory changes. Spleen: Normal in size without focal abnormality. Adrenals/Urinary Tract: Adrenal glands are unremarkable. Kidneys are normal, without renal calculi, focal lesion, or hydronephrosis. Bladder is unremarkable. Stomach/Bowel: No bowel abnormalities identified on the arterial phase study without oral contrast. No  free intraperitoneal air identified. Lymphatic: No enlarged lymph nodes identified. Reproductive: Prostate is unremarkable. Other: No abdominal wall hernia or abnormality. No abdominopelvic ascites. Musculoskeletal: No acute or significant osseous findings. Review of the MIP images confirms the above findings. IMPRESSION: Stable postoperative appearance of the thoracic and abdominal aorta after prior extensive procedures including aortic valve replacement, aortic root replacement, debranching of the great vessels with elephant graft, TEVAR and thoracoabdominal aortic repair with grafting to the celiac axis, SMA and bilateral renal arteries. No evidence of acute dissection, hemorrhage or other acute findings demonstrated. Electronically Signed   By: Aletta Edouard M.D.   On: 12/02/2017 17:15    Patient Profile     47 y.o. male with past medical history of aortic dissection repair, severe hypertension, prior substance abuse, hyperlipidemia, chronic chest pain for evaluation of non-ST elevation myocardial infarction. Patient is status post aortic dissection in February 2016.  Left heart catheterization April 2016 demonstrated a 60% first diagonal.  He was transferred to [facility] and underwent aortic valve replacement with a bovine pericardial tissue valve and coronary reconstruction.  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  He has had intermittent chest pain since that time by his report.  Chest CT shows stable previous thoracic and abdominal aorta repairs.  Assessment & Plan    1 non-ST elevation myocardial infarction-patient is pain-free this morning.  Continue aspirin, statin, heparin and beta-blocker.  He will need cardiac catheterization.  However he has baseline renal insufficiency and also received contrast for his CTA of thoracic and abdominal aorta at time of admission.  I would therefore like to delay catheterization until Monday unless he  develops recurrent chest pain.  We will hydrate prior to the procedure.  The risks and benefits including myocardial infarction, CVA, death and contrast nephropathy discussed and patient agrees to proceed.  We will proceed Monday as long as he remains stable.  Note he has a long history of chronic chest pain since his previous dissection repair.  Echocardiogram to assess LV function.  2 chronic stage III kidney disease-follow renal function closely as he had a CTA of his aorta at time of admission.  3 hypertension-DC cleviprex; increase labetalol to 400 mg BID; follow BP and adjust as needed.  4  prior aortic dissection repair-stable on follow-up CTA.  5 hyperlipidemia-continue statin.  6 headache-DC nitroglycerin and follow.  For questions or updates, please contact [place] Please consult [email or web address] for contact info under        Signed, Kirk Ruths, MD  12/03/2017, 9:33 AM

## 2017-12-03 NOTE — Plan of Care (Signed)
  Problem: Health Behavior/Discharge Planning: Goal: Ability to manage health-related needs will improve Outcome: Progressing   Problem: Clinical Measurements: Goal: Ability to maintain clinical measurements within normal limits will improve Outcome: Progressing Goal: Cardiovascular complication will be avoided Outcome: Progressing   Problem: Activity: Goal: Risk for activity intolerance will decrease Outcome: Progressing   Problem: Pain Managment: Goal: General experience of comfort will improve Outcome: Progressing   Problem: Safety: Goal: Ability to remain free from injury will improve Outcome: Progressing   Problem: Education: Goal: Knowledge of General Education information will improve Description Including pain rating scale, medication(s)/side effects and non-pharmacologic comfort measures Outcome: Completed/Met   Problem: Nutrition: Goal: Adequate nutrition will be maintained Outcome: Completed/Met   Problem: Coping: Goal: Level of anxiety will decrease Outcome: Completed/Met   Problem: Elimination: Goal: Will not experience complications related to urinary retention Outcome: Completed/Met   Problem: Skin Integrity: Goal: Risk for impaired skin integrity will decrease Outcome: Completed/Met

## 2017-12-03 NOTE — Progress Notes (Signed)
[facility] Summary Report / Loop Recorder 

## 2017-12-03 NOTE — Progress Notes (Signed)
ANTICOAGULATION CONSULT NOTE - Follow Up Consult  Pharmacy Consult for heparin Indication: chest pain/ACS  Labs: Recent Labs    12/02/17 1457 12/02/17 1757 12/03/17 0214 12/03/17 0653 12/03/17 1214  HGB 13.2  --  15.5  --   --   HCT 41.4  --  43.2  --   --   PLT 112*  --  130*  --   --   HEPARINUNFRC  --   --  <0.10*  --  0.44  CREATININE 2.07*  --  1.58*  --   --   TROPONINI  --  2.14*  --  2.39* 1.77*    Assessment: 47 yo M with PMH of aortic dissection repair (bioprosthetic valve 04/2014, no PTA anticoagulation) admitted for chest pain. Pharmacy has been consulted for heparin dosing for NSTEMI. Troponins remain elevated but trending down.  Heparin level this afternoon is therapeutic at 0.44 s/p increase in rate this morning. CBC stable with Hgb 15.5 and platelets 132, and no documented bleeding.  Goal of Therapy:  Heparin level 0.3-0.7 units/ml Monitor platelets by anticoagulation protocol: Yes   Plan:  Continue heparin rate at 1100 units/hr HL in 6 hours Daily HL, CBC while on heparin Monitor for s/sx bleeding   Brendolyn Patty, PharmD PGY1 Pharmacy Resident Phone [phone]  12/03/2017   4:26 PM

## 2017-12-04 LAB — CBC
HCT: 41.5 % (ref 39.0–52.0)
HEMOGLOBIN: 13.7 g/dL (ref 13.0–17.0)
MCH: 31.5 pg (ref 26.0–34.0)
MCHC: 33 g/dL (ref 30.0–36.0)
MCV: 95.4 fL (ref 78.0–100.0)
PLATELETS: 136 10*3/uL — AB (ref 150–400)
RBC: 4.35 MIL/uL (ref 4.22–5.81)
RDW: 13.6 % (ref 11.5–15.5)
WBC: 5.6 10*3/uL (ref 4.0–10.5)

## 2017-12-04 LAB — HEPARIN LEVEL (UNFRACTIONATED)
Heparin Unfractionated: 0.34 IU/mL (ref 0.30–0.70)
Heparin Unfractionated: 0.39 IU/mL (ref 0.30–0.70)

## 2017-12-04 MED ORDER — HYDRALAZINE HCL 25 MG PO TABS
25.0000 mg | ORAL_TABLET | Freq: Two times a day (BID) | ORAL | Status: DC
  Administered 2017-12-04 – 2017-12-05 (×4): 25 mg via ORAL
  Filled 2017-12-04 (×4): qty 1

## 2017-12-04 NOTE — Progress Notes (Signed)
Received report from RN; Patient arrived from 7M to 4E06; A/O x4; IV medications running; Room air; No c/o of pain; BP 183/100, PRN given; Will continue to monitor

## 2017-12-04 NOTE — Progress Notes (Addendum)
ANTICOAGULATION CONSULT NOTE - Follow Up Consult  Pharmacy Consult for heparin Indication: chest pain/ACS  Labs: Recent Labs    12/02/17 1457 12/02/17 1757  12/03/17 0214 12/03/17 0653 12/03/17 1214 12/03/17 2005 12/04/17 0301  HGB 13.2  --   --  15.5  --   --   --  13.7  HCT 41.4  --   --  43.2  --   --   --  41.5  PLT 112*  --   --  130*  --   --   --  136*  HEPARINUNFRC  --   --    < > <0.10*  --  0.44 0.23* 0.34  CREATININE 2.07*  --   --  1.58*  --   --   --   --   TROPONINI  --  2.14*  --   --  2.39* 1.77*  --   --    < > = values in this interval not displayed.    Assessment: 47 yo M with PMH of aortic dissection repair (bioprosthetic valve 04/2014, no PTA anticoagulation) admitted for chest pain. Pharmacy has been consulted for heparin dosing for NSTEMI. Troponins remain elevated but trending down.  Heparin level therapeutic. Hg wnl, plt low stable 136. No bleed documented.  Goal of Therapy:  Heparin level 0.3-0.7 units/ml Monitor platelets by anticoagulation protocol: Yes   Plan:  Continue IV heparin at 1250 units/hr 6h heparin level to confirm Monitor daily heparin level and CBC, s/sx bleeding Cath scheduled for 9/23  Elicia Lamp, PharmD, BCPS Clinical Pharmacist Clinical phone [phone] Please check AMION for all [place] contact numbers  12/04/2017       10:57 AM     ADDENDUM:  Confirmatory heparin level therapeutic. No bleed documented.  Plan: Continue IV heparin at 1250 units/hr Monitor daily heparin level and CBC, s/sx bleeding Cath scheduled for 9/23  Elicia Lamp, PharmD, BCPS Please check AMION for all [place] contact numbers Clinical Pharmacist 12/04/2017 1:02 PM

## 2017-12-04 NOTE — Progress Notes (Signed)
Subjective:  No complaints of chest pain or shortness of breath.  Blood pressure remains elevated.  Objective:  Vital Signs in the last 24 hours: BP (!) 202/110 (BP Location: Right Arm)   Pulse 62   Temp 98.5 F (36.9 C) (Oral)   Resp 15   Ht 6' (1.829 m)   Wt 67.7 kg   SpO2 99%   BMI 20.25 kg/m   Physical Exam: Thin black male currently in no acute distress Lungs:  Clear Cardiac:  Regular rhythm, normal S1 and S2, no S3 Abdomen:  Soft, nontender, no masses Extremities:  No edema present  Intake/Output from previous day: 09/20 0701 - 09/21 0700 In: 1512.1 [I.V.:1512.1] Out: 2300 [Urine:2300]  Weight Filed Weights   12/02/17 2218 12/03/17 0439 12/04/17 0253  Weight: 69.1 kg 68.3 kg 67.7 kg    Lab Results: Basic Metabolic Panel: Recent Labs    12/02/17 1457 12/03/17 0214  NA 139 135  K 4.0 4.1  CL 108 103  CO2 24 21*  GLUCOSE 105* 82  BUN 18 15  CREATININE 2.07* 1.58*   CBC: Recent Labs    12/02/17 1457 12/03/17 0214 12/04/17 0301  WBC 5.2 6.2 5.6  NEUTROABS 3.1  --   --   HGB 13.2 15.5 13.7  HCT 41.4 43.2 41.5  MCV 97.6 94.1 95.4  PLT 112* 130* 136*   Cardiac Enzymes: Troponin (Point of Care Test) Recent Labs    12/02/17 1505  TROPIPOC 1.69*   Cardiac Panel (last 3 results) Recent Labs    12/02/17 1757 12/03/17 0653 12/03/17 1214  TROPONINI 2.14* 2.39* 1.77*    Telemetry: Sinus rhythm  Assessment/Plan:  1.  Non-STEMI 2.  Stage III chronic kidney disease 3.  Hypertensive heart disease 4.  Prior aortic dissection repair stable  Recommendations:  Recheck renal function tomorrow and continue hydration.  Catheterization planned for Monday.  And hydralazine for control of blood pressure.  With prior history of dissection will be very important to have good blood pressure control.  May eventually need either higher dose beta-blocker or addition of minoxidil.   Kerry Hough  MD [facility] Cardiology  12/04/2017, 8:56 AM

## 2017-12-05 LAB — CBC
HCT: 37.6 % — ABNORMAL LOW (ref 39.0–52.0)
Hemoglobin: 12.5 g/dL — ABNORMAL LOW (ref 13.0–17.0)
MCH: 32.1 pg (ref 26.0–34.0)
MCHC: 33.2 g/dL (ref 30.0–36.0)
MCV: 96.4 fL (ref 78.0–100.0)
PLATELETS: 126 10*3/uL — AB (ref 150–400)
RBC: 3.9 MIL/uL — ABNORMAL LOW (ref 4.22–5.81)
RDW: 14 % (ref 11.5–15.5)
WBC: 4.3 10*3/uL (ref 4.0–10.5)

## 2017-12-05 LAB — BASIC METABOLIC PANEL
ANION GAP: 9 (ref 5–15)
BUN: 22 mg/dL — AB (ref 6–20)
CALCIUM: 8.6 mg/dL — AB (ref 8.9–10.3)
CO2: 21 mmol/L — ABNORMAL LOW (ref 22–32)
Chloride: 109 mmol/L (ref 98–111)
Creatinine, Ser: 1.93 mg/dL — ABNORMAL HIGH (ref 0.61–1.24)
GFR calc Af Amer: 47 mL/min — ABNORMAL LOW (ref >60)
GFR, EST NON AFRICAN AMERICAN: 40 mL/min — AB (ref >60)
Glucose, Bld: 87 mg/dL (ref 70–99)
Potassium: 3.8 mmol/L (ref 3.5–5.1)
SODIUM: 139 mmol/L (ref 135–145)

## 2017-12-05 LAB — HEPARIN LEVEL (UNFRACTIONATED): Heparin Unfractionated: 0.42 IU/mL (ref 0.30–0.70)

## 2017-12-05 MED ORDER — SODIUM CHLORIDE 0.9 % IV SOLN: 250.0000 mL | INTRAVENOUS | Status: DC | PRN

## 2017-12-05 MED ORDER — ASPIRIN 81 MG PO CHEW: 81.0000 mg | CHEWABLE_TABLET | ORAL | Status: DC

## 2017-12-05 MED ORDER — SODIUM CHLORIDE 0.9% FLUSH: 3.0000 mL | INTRAVENOUS | Status: DC | PRN

## 2017-12-05 MED ORDER — SODIUM CHLORIDE 0.9 % IV SOLN: INTRAVENOUS | Status: DC

## 2017-12-05 MED ORDER — SODIUM CHLORIDE 0.9% FLUSH: 3.0000 mL | Freq: Two times a day (BID) | INTRAVENOUS | Status: DC

## 2017-12-05 NOTE — Progress Notes (Signed)
ANTICOAGULATION CONSULT NOTE - Follow Up Consult  Pharmacy Consult for heparin Indication: chest pain/ACS  Labs: Recent Labs    12/02/17 1457 12/02/17 1757  12/03/17 0214 12/03/17 0653 12/03/17 1214  12/04/17 0301 12/04/17 1122 12/05/17 0321  HGB 13.2  --   --  15.5  --   --   --  13.7  --  12.5*  HCT 41.4  --   --  43.2  --   --   --  41.5  --  37.6*  PLT 112*  --   --  130*  --   --   --  136*  --  126*  HEPARINUNFRC  --   --    < > <0.10*  --  0.44   < > 0.34 0.39 0.42  CREATININE 2.07*  --   --  1.58*  --   --   --   --   --  1.93*  TROPONINI  --  2.14*  --   --  2.39* 1.77*  --   --   --   --    < > = values in this interval not displayed.    Assessment: 47 yo M with PMH of aortic dissection repair (bioprosthetic valve 04/2014, no PTA anticoagulation) admitted for chest pain. Pharmacy has been consulted for heparin dosing for NSTEMI. Troponins remain elevated but trended down.  Heparin level therapeutic. Hg down a bit to 12.5, plt low stable 126. No bleed documented.  Goal of Therapy:  Heparin level 0.3-0.7 units/ml Monitor platelets by anticoagulation protocol: Yes   Plan:  Continue IV heparin at 1250 units/hr Monitor daily heparin level and CBC, s/sx bleeding Cath scheduled for 9/23  Elicia Lamp, PharmD, BCPS Clinical Pharmacist Clinical phone [phone] Please check AMION for all [place] contact numbers 12/05/2017 10:24 AM

## 2017-12-05 NOTE — Progress Notes (Signed)
Subjective:  Complains of some vague left-sided chest pain but has had chest pain a lot.  Blood pressure remains somewhat elevated.  He is frustrated over the fact that his blood pressure is really not been able to be controlled well and spent a long time asking questions about this.  Hydralazine was added yesterday.  He might be a candidate for minoxidil.  Objective:  Vital Signs in the last 24 hours: BP (!) 178/106   Pulse 73   Temp 98.3 F (36.8 C) (Oral)   Resp 15   Ht 6' (1.829 m)   Wt 68.3 kg   SpO2 98%   BMI 20.43 kg/m   Physical Exam: Thin black male currently in no acute distress Lungs:  Clear Cardiac:  Regular rhythm, normal S1 and S2, no S3 Abdomen:  Soft, nontender, no masses Extremities:  No edema present  Intake/Output from previous day: 09/21 0701 - 09/22 0700 In: 483 [P.O.:480; I.V.:3] Out: 1600 [Urine:1600]  Weight Filed Weights   12/03/17 0439 12/04/17 0253 12/05/17 0309  Weight: 68.3 kg 67.7 kg 68.3 kg    Lab Results: Basic Metabolic Panel: Recent Labs    12/03/17 0214 12/05/17 0321  NA 135 139  K 4.1 3.8  CL 103 109  CO2 21* 21*  GLUCOSE 82 87  BUN 15 22*  CREATININE 1.58* 1.93*   CBC: Recent Labs    12/02/17 1457  12/04/17 0301 12/05/17 0321  WBC 5.2   < > 5.6 4.3  NEUTROABS 3.1  --   --   --   HGB 13.2   < > 13.7 12.5*  HCT 41.4   < > 41.5 37.6*  MCV 97.6   < > 95.4 96.4  PLT 112*   < > 136* 126*   < > = values in this interval not displayed.   Cardiac Enzymes: Troponin (Point of Care Test) Recent Labs    12/02/17 1505  TROPIPOC 1.69*   Cardiac Panel (last 3 results) Recent Labs    12/02/17 1757 12/03/17 0653 12/03/17 1214  TROPONINI 2.14* 2.39* 1.77*    Telemetry: Sinus rhythm  Assessment/Plan:  1.  Non-STEMI 2.  Stage III chronic kidney disease-creatinine is 1.93 today.  Will recheck in the morning.  3.  Hypertensive heart disease 4.  Prior aortic dissection repair stable  Recommendations:  Recheck renal  function tomorrow and continue hydration.  Catheterization planned for Monday.  Discussed catheterization either through the radial approach or the femoral approach but in light of his previous complex surgery not sure which is going to be the best way for him to go since he has had a total arch replacement.    Kerry Hough  MD [facility] Card[facility]  12/05/2017, 10:06 AM

## 2017-12-05 NOTE — H&P (View-Only) (Signed)
Subjective:  Complains of some vague left-sided chest pain but has had chest pain a lot.  Blood pressure remains somewhat elevated.  He is frustrated over the fact that his blood pressure is really not been able to be controlled well and spent a long time asking questions about this.  Hydralazine was added yesterday.  He might be a candidate for minoxidil.  Objective:  Vital Signs in the last 24 hours: BP (!) 178/106   Pulse 73   Temp 98.3 F (36.8 C) (Oral)   Resp 15   Ht 6' (1.829 m)   Wt 68.3 kg   SpO2 98%   BMI 20.43 kg/m   Physical Exam: Thin black male currently in no acute distress Lungs:  Clear Cardiac:  Regular rhythm, normal S1 and S2, no S3 Abdomen:  Soft, nontender, no masses Extremities:  No edema present  Intake/Output from previous day: 09/21 0701 - 09/22 0700 In: 483 [P.O.:480; I.V.:3] Out: 1600 [Urine:1600]  Weight Filed Weights   12/03/17 0439 12/04/17 0253 12/05/17 0309  Weight: 68.3 kg 67.7 kg 68.3 kg    Lab Results: Basic Metabolic Panel: Recent Labs    12/03/17 0214 12/05/17 0321  NA 135 139  K 4.1 3.8  CL 103 109  CO2 21* 21*  GLUCOSE 82 87  BUN 15 22*  CREATININE 1.58* 1.93*   CBC: Recent Labs    12/02/17 1457  12/04/17 0301 12/05/17 0321  WBC 5.2   < > 5.6 4.3  NEUTROABS 3.1  --   --   --   HGB 13.2   < > 13.7 12.5*  HCT 41.4   < > 41.5 37.6*  MCV 97.6   < > 95.4 96.4  PLT 112*   < > 136* 126*   < > = values in this interval not displayed.   Cardiac Enzymes: Troponin (Point of Care Test) Recent Labs    12/02/17 1505  TROPIPOC 1.69*   Cardiac Panel (last 3 results) Recent Labs    12/02/17 1757 12/03/17 0653 12/03/17 1214  TROPONINI 2.14* 2.39* 1.77*    Telemetry: Sinus rhythm  Assessment/Plan:  1.  Non-STEMI 2.  Stage III chronic kidney disease-creatinine is 1.93 today.  Will recheck in the morning.  3.  Hypertensive heart disease 4.  Prior aortic dissection repair stable  Recommendations:  Recheck renal  function tomorrow and continue hydration.  Catheterization planned for Monday.  Discussed catheterization either through the radial approach or the femoral approach but in light of his previous complex surgery not sure which is going to be the best way for him to go since he has had a total arch replacement.    Kerry Hough  MD [facility]  12/05/2017, 10:06 AM

## 2017-12-06 ENCOUNTER — Encounter (HOSPITAL_COMMUNITY): Payer: Self-pay | Admitting: Interventional Cardiology

## 2017-12-06 ENCOUNTER — Encounter (HOSPITAL_COMMUNITY)
Admission: EM | Disposition: A | Discharge status: Home / Self Care | Payer: Self-pay | Source: Home / Self Care | Attending: Cardiology

## 2017-12-06 DIAGNOSIS — I712 Thoracic aortic aneurysm, without rupture: Secondary | ICD-10-CM

## 2017-12-06 DIAGNOSIS — I2510 Atherosclerotic heart disease of native coronary artery without angina pectoris: Secondary | ICD-10-CM

## 2017-12-06 DIAGNOSIS — Z952 Presence of prosthetic heart valve: Secondary | ICD-10-CM

## 2017-12-06 DIAGNOSIS — I161 Hypertensive emergency: Secondary | ICD-10-CM

## 2017-12-06 DIAGNOSIS — I25119 Atherosclerotic heart disease of native coronary artery with unspecified angina pectoris: Secondary | ICD-10-CM | POA: Diagnosis present

## 2017-12-06 HISTORY — PX: CORONARY ANGIOGRAPHY: CATH118303

## 2017-12-06 LAB — BASIC METABOLIC PANEL
ANION GAP: 10 (ref 5–15)
BUN: 18 mg/dL (ref 6–20)
CO2: 22 mmol/L (ref 22–32)
Calcium: 8.9 mg/dL (ref 8.9–10.3)
Chloride: 107 mmol/L (ref 98–111)
Creatinine, Ser: 1.84 mg/dL — ABNORMAL HIGH (ref 0.61–1.24)
GFR, EST AFRICAN AMERICAN: 49 mL/min — AB (ref >60)
GFR, EST NON AFRICAN AMERICAN: 43 mL/min — AB (ref >60)
Glucose, Bld: 81 mg/dL (ref 70–99)
POTASSIUM: 3.6 mmol/L (ref 3.5–5.1)
SODIUM: 139 mmol/L (ref 135–145)

## 2017-12-06 LAB — CUP PACEART REMOTE DEVICE CHECK
Implantable Pulse Generator Implant Date: 20180406
MDC IDC SESS DTM: 20190818041125

## 2017-12-06 LAB — CBC
HCT: 41.4 % (ref 39.0–52.0)
Hemoglobin: 13 g/dL (ref 13.0–17.0)
MCH: 31.3 pg (ref 26.0–34.0)
MCHC: 31.4 g/dL (ref 30.0–36.0)
MCV: 99.5 fL (ref 78.0–100.0)
Platelets: 119 10*3/uL — ABNORMAL LOW (ref 150–400)
RBC: 4.16 MIL/uL — AB (ref 4.22–5.81)
RDW: 14.4 % (ref 11.5–15.5)
WBC: 3.6 10*3/uL — AB (ref 4.0–10.5)

## 2017-12-06 LAB — HEPARIN LEVEL (UNFRACTIONATED): Heparin Unfractionated: 0.39 IU/mL (ref 0.30–0.70)

## 2017-12-06 SURGERY — CORONARY ANGIOGRAPHY (CATH LAB)
Anesthesia: LOCAL

## 2017-12-06 MED ORDER — HEPARIN (PORCINE) IN NACL 100-0.45 UNIT/ML-% IJ SOLN
1350.0000 [IU]/h | INTRAMUSCULAR | Status: DC
  Administered 2017-12-06: 1250 [IU]/h via INTRAVENOUS
  Filled 2017-12-06: qty 250

## 2017-12-06 MED ORDER — VERAPAMIL HCL 2.5 MG/ML IV SOLN
INTRAVENOUS | Status: AC
  Filled 2017-12-06: qty 2

## 2017-12-06 MED ORDER — HEPARIN SODIUM (PORCINE) 1000 UNIT/ML IJ SOLN
INTRAMUSCULAR | Status: AC
  Filled 2017-12-06: qty 1

## 2017-12-06 MED ORDER — SODIUM CHLORIDE 0.9 % IV SOLN: 250.0000 mL | INTRAVENOUS | Status: DC | PRN

## 2017-12-06 MED ORDER — NITROGLYCERIN 0.4 MG SL SUBL
SUBLINGUAL_TABLET | SUBLINGUAL | Status: DC | PRN
  Administered 2017-12-06: .4 mg via SUBLINGUAL

## 2017-12-06 MED ORDER — SODIUM CHLORIDE 0.9 % WEIGHT BASED INFUSION
1.0000 mL/kg/h | INTRAVENOUS | Status: DC
  Administered 2017-12-06: 1 mL/kg/h via INTRAVENOUS

## 2017-12-06 MED ORDER — SODIUM CHLORIDE 0.9% FLUSH: 3.0000 mL | INTRAVENOUS | Status: DC | PRN

## 2017-12-06 MED ORDER — LABETALOL HCL 5 MG/ML IV SOLN
10.0000 mg | INTRAVENOUS | Status: DC | PRN
  Administered 2017-12-06 – 2017-12-08 (×5): 10 mg via INTRAVENOUS
  Filled 2017-12-06 (×5): qty 4

## 2017-12-06 MED ORDER — ONDANSETRON HCL 4 MG/2ML IJ SOLN
4.0000 mg | Freq: Four times a day (QID) | INTRAMUSCULAR | Status: DC | PRN
  Administered 2017-12-06 – 2017-12-08 (×2): 4 mg via INTRAVENOUS
  Filled 2017-12-06 (×2): qty 2

## 2017-12-06 MED ORDER — LIDOCAINE HCL (PF) 1 % IJ SOLN
INTRAMUSCULAR | Status: DC | PRN
  Administered 2017-12-06: 2 mL

## 2017-12-06 MED ORDER — HYDRALAZINE HCL 20 MG/ML IJ SOLN
10.0000 mg | INTRAMUSCULAR | Status: DC | PRN
  Administered 2017-12-06 – 2017-12-07 (×2): 10 mg via INTRAVENOUS
  Filled 2017-12-06 (×2): qty 1

## 2017-12-06 MED ORDER — FENTANYL CITRATE (PF) 100 MCG/2ML IJ SOLN
INTRAMUSCULAR | Status: AC
  Filled 2017-12-06: qty 2

## 2017-12-06 MED ORDER — HEPARIN (PORCINE) IN NACL 1000-0.9 UT/500ML-% IV SOLN
INTRAVENOUS | Status: AC
  Filled 2017-12-06: qty 1000

## 2017-12-06 MED ORDER — NITROGLYCERIN 0.4 MG SL SUBL
SUBLINGUAL_TABLET | SUBLINGUAL | Status: AC
  Filled 2017-12-06: qty 1

## 2017-12-06 MED ORDER — ATORVASTATIN CALCIUM 80 MG PO TABS
80.0000 mg | ORAL_TABLET | Freq: Every day | ORAL | Status: DC
  Administered 2017-12-06 – 2017-12-10 (×5): 80 mg via ORAL
  Filled 2017-12-06 (×5): qty 1

## 2017-12-06 MED ORDER — HYDRALAZINE HCL 20 MG/ML IJ SOLN
INTRAMUSCULAR | Status: DC | PRN
  Administered 2017-12-06: 10 mg via INTRAVENOUS

## 2017-12-06 MED ORDER — SODIUM CHLORIDE 0.9% FLUSH
3.0000 mL | Freq: Two times a day (BID) | INTRAVENOUS | Status: DC
  Administered 2017-12-06 – 2017-12-07 (×2): 3 mL via INTRAVENOUS

## 2017-12-06 MED ORDER — FENTANYL CITRATE (PF) 100 MCG/2ML IJ SOLN
INTRAMUSCULAR | Status: DC | PRN
  Administered 2017-12-06 (×2): 50 ug via INTRAVENOUS

## 2017-12-06 MED ORDER — IOHEXOL 350 MG/ML SOLN
INTRAVENOUS | Status: DC | PRN
  Administered 2017-12-06: 140 mL via INTRA_ARTERIAL

## 2017-12-06 MED ORDER — CARVEDILOL 12.5 MG PO TABS
12.5000 mg | ORAL_TABLET | Freq: Two times a day (BID) | ORAL | Status: DC
  Administered 2017-12-06 (×2): 12.5 mg via ORAL
  Filled 2017-12-06 (×2): qty 1

## 2017-12-06 MED ORDER — HYDRALAZINE HCL 20 MG/ML IJ SOLN
INTRAMUSCULAR | Status: AC
  Filled 2017-12-06: qty 1

## 2017-12-06 MED ORDER — HYDRALAZINE HCL 50 MG PO TABS
50.0000 mg | ORAL_TABLET | Freq: Two times a day (BID) | ORAL | Status: DC
  Administered 2017-12-06 (×2): 50 mg via ORAL
  Filled 2017-12-06 (×2): qty 1

## 2017-12-06 MED ORDER — MIDAZOLAM HCL 2 MG/2ML IJ SOLN
INTRAMUSCULAR | Status: DC | PRN
  Administered 2017-12-06: 1 mg via INTRAVENOUS

## 2017-12-06 MED ORDER — HEPARIN (PORCINE) IN NACL 1000-0.9 UT/500ML-% IV SOLN
INTRAVENOUS | Status: DC | PRN
  Administered 2017-12-06 (×2): 500 mL

## 2017-12-06 MED ORDER — MORPHINE SULFATE (PF) 2 MG/ML IV SOLN
2.0000 mg | INTRAVENOUS | Status: DC | PRN
  Administered 2017-12-06 – 2017-12-11 (×21): 2 mg via INTRAVENOUS
  Filled 2017-12-06 (×20): qty 1

## 2017-12-06 MED ORDER — HEPARIN SODIUM (PORCINE) 1000 UNIT/ML IJ SOLN
INTRAMUSCULAR | Status: DC | PRN
  Administered 2017-12-06: 3500 [IU] via INTRAVENOUS

## 2017-12-06 MED ORDER — CLOPIDOGREL BISULFATE 75 MG PO TABS
75.0000 mg | ORAL_TABLET | Freq: Every day | ORAL | Status: DC
  Administered 2017-12-07 – 2017-12-10 (×4): 75 mg via ORAL
  Filled 2017-12-06 (×4): qty 1

## 2017-12-06 MED ORDER — MORPHINE SULFATE (PF) 2 MG/ML IV SOLN
2.0000 mg | INTRAVENOUS | Status: DC | PRN
  Administered 2017-12-06: 2 mg via INTRAVENOUS
  Filled 2017-12-06 (×2): qty 1

## 2017-12-06 MED ORDER — LIDOCAINE HCL (PF) 1 % IJ SOLN
INTRAMUSCULAR | Status: AC
  Filled 2017-12-06: qty 30

## 2017-12-06 MED ORDER — CLOPIDOGREL BISULFATE 75 MG PO TABS
300.0000 mg | ORAL_TABLET | Freq: Once | ORAL | Status: AC
  Administered 2017-12-06: 300 mg via ORAL
  Filled 2017-12-06: qty 4

## 2017-12-06 MED ORDER — NITROGLYCERIN IN D5W 200-5 MCG/ML-% IV SOLN
0.0000 ug/min | INTRAVENOUS | Status: DC
  Administered 2017-12-06: 5 ug/min via INTRAVENOUS
  Administered 2017-12-06: 26.667 ug/min via INTRAVENOUS
  Administered 2017-12-07: 25 ug/min via INTRAVENOUS
  Filled 2017-12-06 (×3): qty 250

## 2017-12-06 MED ORDER — MIDAZOLAM HCL 2 MG/2ML IJ SOLN
INTRAMUSCULAR | Status: AC
  Filled 2017-12-06: qty 2

## 2017-12-06 SURGICAL SUPPLY — 11 items
CATH INFINITI 5FR MULTPACK ANG (CATHETERS) ×1 IMPLANT
CATH LAUNCHER 5F EBU4.0 (CATHETERS) ×1 IMPLANT
DEVICE RAD COMP TR BAND LRG (VASCULAR PRODUCTS) ×1 IMPLANT
GLIDESHEATH SLEND A-KIT 6F 22G (SHEATH) ×1 IMPLANT
GUIDEWIRE INQWIRE 1.5J.035X260 (WIRE) IMPLANT
INQWIRE 1.5J .035X260CM (WIRE) ×2
KIT HEART LEFT (KITS) ×2 IMPLANT
PACK CARDIAC CATHETERIZATION (CUSTOM PROCEDURE TRAY) ×2 IMPLANT
SHEATH PROBE COVER 6X72 (BAG) ×1 IMPLANT
TRANSDUCER W/STOPCOCK (MISCELLANEOUS) ×2 IMPLANT
TUBING CIL FLEX 10 FLL-RA (TUBING) ×2 IMPLANT

## 2017-12-06 NOTE — Progress Notes (Addendum)
Progress Note  Patient Name: [name] Date of Encounter: 12/06/2017  Primary Cardiologist: [name], MD  EP: Dr. Caryl Comes   Subjective   Currently CP free, but has intermittent bouts of feeling winded at rest and some palpitations. Tele checked. No arrhthymias to correlate.   Inpatient Medications    Scheduled Meds: . amLODipine  10 mg Oral Daily  . aspirin EC  81 mg Oral Daily  . atorvastatin  20 mg Oral q1800  . carvedilol  12.5 mg Oral BID WC  . cloNIDine  0.3 mg Transdermal Q Fri  . hydrALAZINE  50 mg Oral BID  . sodium chloride flush  3 mL Intravenous Q12H  . sodium chloride flush  3 mL Intravenous Q12H   Continuous Infusions: . sodium chloride 75 mL/hr at 12/06/17 0400  . sodium chloride    . sodium chloride    . sodium chloride    . sodium chloride    . heparin 1,250 Units/hr (12/06/17 0815)   PRN Meds: sodium chloride, sodium chloride, acetaminophen, hydrALAZINE, nitroGLYCERIN, ondansetron (ZOFRAN) IV, sodium chloride flush, sodium chloride flush   Vital Signs    Vitals:   12/06/17 0340 12/06/17 0349 12/06/17 0431 12/06/17 0812  BP: (!) 174/97   (!) 193/95  Pulse: 66  (!) 59 68  Resp: 15  (!) 21 16  Temp: 98.2 F (36.8 C)  98 F (36.7 C) 97.6 F (36.4 C)  TempSrc: Oral  Oral Oral  SpO2: 99%  98% 99%  Weight:  68.5 kg    Height:        Intake/Output Summary (Last 24 hours) at 12/06/2017 0932 Last data filed at 12/06/2017 0400 Gross per 24 hour  Intake 5717.42 ml  Output 2900 ml  Net 2817.42 ml   Filed Weights   12/04/17 0253 12/05/17 0309 12/06/17 0349  Weight: 67.7 kg 68.3 kg 68.5 kg    Telemetry    NSR - Personally Reviewed  ECG    Normal sinus rhythm, Left axis deviation ST & T wave abnormality in lateral leads- Personally Reviewed  Physical Exam   GEN: physically fit appearing AAM in no acute distress.   Neck: No JVD Cardiac: RRR, murmur heard throughout precordium anteriorly, as well as the right posterior  thorax. Respiratory: Clear to auscultation bilaterally. GI: Soft, nontender, non-distended  MS: No edema; No deformity. Neuro:  Nonfocal  Psych: Normal affect   Labs    Chemistry Recent Labs  Lab 12/02/17 1457 12/03/17 0214 12/05/17 0321 12/06/17 0422  NA 139 135 139 139  K 4.0 4.1 3.8 3.6  CL 108 103 109 107  CO2 24 21* 21* 22  GLUCOSE 105* 82 87 81  BUN 18 15 22* 18  CREATININE 2.07* 1.58* 1.93* 1.84*  CALCIUM 8.9 8.8* 8.6* 8.9  PROT 6.6  --   --   --   ALBUMIN 3.4*  --   --   --   AST 18  --   --   --   ALT 10  --   --   --   ALKPHOS 83  --   --   --   BILITOT 0.8  --   --   --   GFRNONAA 37* 51* 40* 43*  GFRAA 43* 59* 47* 49*  ANIONGAP 7 11 9 10      Hematology Recent Labs  Lab 12/04/17 0301 12/05/17 0321 12/06/17 0404  WBC 5.6 4.3 3.6*  RBC 4.35 3.90* 4.16*  HGB 13.7 12.5* 13.0  HCT 41.5 37.6* 41.4  MCV 95.4 96.4 99.5  MCH 31.5 32.1 31.3  MCHC 33.0 33.2 31.4  RDW 13.6 14.0 14.4  PLT 136* 126* 119*    Cardiac Enzymes Recent Labs  Lab 12/02/17 1757 12/03/17 0653 12/03/17 1214  TROPONINI 2.14* 2.39* 1.77*    Recent Labs  Lab 12/02/17 1505  TROPIPOC 1.69*     BNPNo results for input(s): BNP, PROBNP in the last 168 hours.   DDimer No results for input(s): DDIMER in the last 168 hours.   Radiology    No results found.  Cardiac Studies    2D Echo 12/03/2017: Severe LVH -speckled appearance consider infiltrative myopathy.  EF 35 and 40% (reduced from previous).  Diffuse hypokinesis.  GR 1 DD.  High filling pressures.  Bioprosthetic aortic valve present.  Trivial regurgitation.  Mild MR.  Moderate LA dilation.  Mild RA dilation.   LHC 12/06/17 -today (12/06/2017)   Conclusion  95 % proximal ramus intermedius.  Normal, widely patent LM  Mid 30-50% LAD  40% proximal circumflex within a bend.  Proximal RCA within a bend.  No LV gram performed to decrease contrast exposure.  Recommendation Hydrate. Follow kidney  function.  Staged PCI on ramus in 1- 3 days depending on kidney function.  Plavix load.  Recommend dual antiplatelet therapy with Aspirin 81mg  daily long-term (beyond 12 months) because of Plavix.    Patient Profile     [name] is a 47 y.o. AA male with past medical history of aortic dissection s/p Bentall procedure w/ tissue AVR at [facility], CAD w/ [place] in 2016 showing 60% ostial D1, severe hypertension, h/o left real artery stenting,  prior substance abuse, hyperlipidemia, h/o cryptogenic stroke (has implantable loop recorder) and chronic chest pain admitted for evaluation/managment of non-ST elevation myocardial infarction.  Assessment & Plan    1. NSTEMI: troponin peaked at 2.39 and now trending downward at 1.77. His CP was atypical and sharp/pleuritic in nature. 2D echo shows reduced LVEF, down to 35% (previously 55-60% in 2017) and diffuse hypokinesis. He is currently CP free and on IV heparin. Given troponin trend and reduced EF on echo, plan is for LHC today to assess coronaries to r/o obstructive CAD. Keep NPO until post cath. Continue ASA, statin and BB.     Cardiac cath today revealing ramus intermedius disease.  Plan for staged PCI  Heparin restarted.  Start nitroglycerin infusion.  2.  Accelerated hypertension: Difficult to control despite multiple antihypertensives. BB was just changed today from labetolol to Coreg, 12.5 mg BID. Monitor response. He is also on amlodipine 10 mg, clonidine 0.3 patch and hydralazine 50 mg TID.  He dose have a h/o left renal artery stenting. If BP remains elevated w/o + response to coreg addition, consider renal artery dopplers to reassess patency of stent and r/o RAS. Given reduced LVEF, may also consider addition of nitrate therapy. No ACE/ARB due to abnormal renal function.  Findings  Once renal function stabilized, will need an ARB -> PRN IV hydralazine and labetalol ordered.  Anticipate titrating carvedilol up to 25 twice daily  tomorrow.  Adding IV NTG infusion -> had intolerance to Isordil listed (bad headache)  3. Cardiomyopathy (likely hypertensive heart disease, not nonischemic -existing CAD lesion would not explain this reduced EF.): EF reduced, down from 55-60% in 2017 to now 35-40% with diffuse hypokinesis. Plan LHC today to r/o coronary ischemia as etiology. Also noted on echo is severe LVH and LV w/ speckled appearance. ? Infiltrative cardiomyopathy. ?  Amyloid. May need a cardiac MRI. Will defer to MD. From a volume standpoint, he appears euvolemic. Telemetry reviewed. No arrhythmias. Continue medical management for systolic HF. He is on BB therapy with Coreg, 12.5 mg BID. No ACE/ARB or Ent[place]o at this time given abnormal renal function. Continue hydralazine. Given he is AA, he may also benefit from addition of nitrate therapy. .  Monitor for signs of progressive heart failure symptoms with post-cath hydration  Will probably require diuretic, -> wait till post-cath to follow renal function.  4. AKI: improving. SCr down overnight from 1.93>>1.84 (peaked at 2.07 this admit). Lisinopril is on hold. Limit contrast dye exposure during cath. No LV gram. Continue to monitor. Baseline SCr ~1.6-1.8. He is followed by Dr. [name], nephrologist.   5. HLD: on statin therapy w/ Lipitor 20. Last LP on file is from 2017. LDL was elevated at 124 at that time. Will repeat FLP to reassess levels. Given history, would target LDL goal < 70 mg/dL.  --> Atorvastatin increased to 80 mg p.o  6. H/o Aortic Dissection: s/p Bentall Procedure with TEVAR. Followed at [facility]. CT of chest abd/pelvis this admit shows stable appearance of the proximal abdominal aorta status postgraft placement with stable grafts to the celiac axis, superior mesenteric artery and bilateral renal arteries. All of the visceral arteries are normally patent. The aneurysmal portion of the native aorta previously representing a patent false lumen after aortic dissection  remains successfully excluded and thrombosed. Aortic diameter at this level is approximately 4.6 cm including the thrombosed excluded aorta. The distal aorta is normally patent. No evidence of acute dissection. Continue to work on BP control as outlined above.   7. S/P AVR: tissue aortic valve prosthesis, done at time of dissection repair. Echo this admit shows normal velocity (2.2 m/s) and trace AI.  8. Mitral Regurgitation: mild MR noted on echo.   9. CAD: per records, LHC in 2016 showed 60% D1. Plan LHC today given CP, elevated troponin and reduced EF. Currently CP free. Continue IV heparin until cath. Continue medical therapy, w/ ASA, statin and BB. Will check FLP. Goal LDL < 70 mg/dL.   Cardiac cath images from today reviewed: 95% proximal RI.  30% mid LAD.  40% proximal circumflex. - planned staged PCI based upon renal fxn.  7. H/o Cryptogenic Stroke: s/p implantable loop recorder, followed by Dr. Caryl Comes. Per records, no detection of afib.   MD to follow with further recommendations.    For questions or updates, please contact [facility] Please consult [email or web address] for contact info under        Signed, Lyda Jester, PA-C  12/06/2017, 9:32 AM    I have seen, examined and evaluated the patient this AM/PM along with Ellen Henri, PA-C right just prior to his cardiac catheterization and again following cardiac catheterization. .  After reviewing all the available data and chart, we discussed the patients laboratory, study & physical findings as well as symptoms in detail. I agree with her findings, examination as well as impression recommendations as per our discussion.    Attending adjustments noted in italics.   Blood pressure remains difficult to control.  Cath revealed progression of ramus intermedius disease to now most significant napkin ring lesion in the proximal ramus.  Plan is to hydrate overnight, reassess renal function and potentially consider staged PCI  tomorrow with Dr. Daneen Schick.  Will need to follow renal function. Continue to adjust blood pressure medications, anticipate titrating up carvedilol and hydralazine.  Would like  to see stable blood pressure prior to discharge.    Glenetta Hew, M.D., M.S. Interventional Cardiologist   Pager # [phone] Phone # [phone] [place], [place] [address]

## 2017-12-06 NOTE — Progress Notes (Addendum)
ANTICOAGULATION CONSULT NOTE - Follow Up Consult  Pharmacy Consult for heparin Indication: chest pain/ACS  Labs: Recent Labs    12/03/17 1214  12/04/17 0301 12/04/17 1122 12/05/17 0321 12/06/17 0404 12/06/17 0422  HGB  --    < > 13.7  --  12.5* 13.0  --   HCT  --   --  41.5  --  37.6* 41.4  --   PLT  --   --  136*  --  126* 119*  --   HEPARINUNFRC 0.44   < > 0.34 0.39 0.42 0.39  --   CREATININE  --   --   --   --  1.93*  --  1.84*  TROPONINI 1.77*  --   --   --   --   --   --    < > = values in this interval not displayed.    Assessment: 47 yo M with PMH of aortic dissection repair (bioprosthetic valve 04/2014, no PTA anticoagulation) admitted for chest pain. Pharmacy has been consulted for heparin dosing for NSTEMI. Plans for cath today. -heparin level =0.39  Goal of Therapy:  Heparin level 0.3-0.7 units/ml Monitor platelets by anticoagulation protocol: Yes   Plan:  Continue IV heparin at 1250 units/hr Monitor daily heparin level and CBC  Hildred Laser, PharmD Clinical Pharmacist Please check Amion for pharmacy contact number   Addendum -s/p cath for staged PCI to restart heparin 8 hours post sheath removal (removed ~ 12:15pm)  Plan -Restart heparin at 8:30pm as long as there are no TR band complications -Heparin level in 6 hours and daily wth CBC daily  Hildred Laser, PharmD Clinical Pharmacist Please check Amion for pharmacy contact number

## 2017-12-06 NOTE — Interval H&P Note (Signed)
Cath Lab Visit (complete for each Cath Lab visit)  Clinical Evaluation Leading to the Procedure:   ACS: Yes.    Non-ACS:    Anginal Classification: CCS III  Anti-ischemic medical therapy: Minimal Therapy (1 class of medications)  Non-Invasive Test Results: No non-invasive testing performed  Prior CABG: Previous CABG      History and Physical Interval Note:  12/06/2017 11:10 AM  [name]  has presented today for surgery, with the diagnosis of NSTEMI  The various methods of treatment have been discussed with the patient and family. After consideration of risks, benefits and other options for treatment, the patient has consented to  Procedure(s): LEFT HEART CATH AND CORONARY ANGIOGRAPHY (N/A) as a surgical intervention .  The patient's history has been reviewed, patient examined, no change in status, stable for surgery.  I have reviewed the patient's chart and labs.  Questions were answered to the patient's satisfaction.     Belva Crome III

## 2017-12-06 NOTE — Progress Notes (Signed)
Pt back from cath lab, VSS, back on tele. TR Band w/ 13 cc on at 12:15 pm. 2cc removed @ 12:48 pm and bleeding noted. TR Band inflated back to 13cc w/ bleeding stopped. Will recheck in 30 minutes.

## 2017-12-07 ENCOUNTER — Encounter (HOSPITAL_COMMUNITY)
Admission: EM | Disposition: A | Discharge status: Home / Self Care | Payer: Self-pay | Source: Home / Self Care | Attending: Cardiology

## 2017-12-07 DIAGNOSIS — I359 Nonrheumatic aortic valve disorder, unspecified: Secondary | ICD-10-CM

## 2017-12-07 DIAGNOSIS — I5043 Acute on chronic combined systolic (congestive) and diastolic (congestive) heart failure: Secondary | ICD-10-CM

## 2017-12-07 DIAGNOSIS — I43 Cardiomyopathy in diseases classified elsewhere: Secondary | ICD-10-CM

## 2017-12-07 DIAGNOSIS — E785 Hyperlipidemia, unspecified: Secondary | ICD-10-CM

## 2017-12-07 DIAGNOSIS — I10 Essential (primary) hypertension: Secondary | ICD-10-CM

## 2017-12-07 DIAGNOSIS — I119 Hypertensive heart disease without heart failure: Secondary | ICD-10-CM

## 2017-12-07 LAB — BASIC METABOLIC PANEL
ANION GAP: 8 (ref 5–15)
BUN: 18 mg/dL (ref 6–20)
CO2: 25 mmol/L (ref 22–32)
Calcium: 9.2 mg/dL (ref 8.9–10.3)
Chloride: 104 mmol/L (ref 98–111)
Creatinine, Ser: 1.94 mg/dL — ABNORMAL HIGH (ref 0.61–1.24)
GFR calc Af Amer: 46 mL/min — ABNORMAL LOW (ref >60)
GFR, EST NON AFRICAN AMERICAN: 40 mL/min — AB (ref >60)
GLUCOSE: 101 mg/dL — AB (ref 70–99)
POTASSIUM: 4 mmol/L (ref 3.5–5.1)
Sodium: 137 mmol/L (ref 135–145)

## 2017-12-07 LAB — HEPARIN LEVEL (UNFRACTIONATED)
Heparin Unfractionated: 0.21 IU/mL — ABNORMAL LOW (ref 0.30–0.70)
Heparin Unfractionated: 0.35 IU/mL (ref 0.30–0.70)

## 2017-12-07 LAB — CBC
HEMATOCRIT: 40.6 % (ref 39.0–52.0)
Hemoglobin: 13.2 g/dL (ref 13.0–17.0)
MCH: 31.4 pg (ref 26.0–34.0)
MCHC: 32.5 g/dL (ref 30.0–36.0)
MCV: 96.4 fL (ref 78.0–100.0)
Platelets: 139 10*3/uL — ABNORMAL LOW (ref 150–400)
RBC: 4.21 MIL/uL — AB (ref 4.22–5.81)
RDW: 14.1 % (ref 11.5–15.5)
WBC: 5.2 10*3/uL (ref 4.0–10.5)

## 2017-12-07 SURGERY — INVASIVE LAB ABORTED CASE

## 2017-12-07 MED ORDER — SODIUM CHLORIDE 0.9 % IV SOLN
INTRAVENOUS | Status: DC
  Administered 2017-12-07: 10:00:00 via INTRAVENOUS

## 2017-12-07 MED ORDER — HEPARIN (PORCINE) IN NACL 100-0.45 UNIT/ML-% IJ SOLN
1350.0000 [IU]/h | INTRAMUSCULAR | Status: DC
  Administered 2017-12-07 – 2017-12-09 (×3): 1350 [IU]/h via INTRAVENOUS
  Filled 2017-12-07 (×4): qty 250

## 2017-12-07 MED ORDER — HYDRALAZINE HCL 50 MG PO TABS
100.0000 mg | ORAL_TABLET | Freq: Two times a day (BID) | ORAL | Status: DC
  Administered 2017-12-07 – 2017-12-08 (×3): 100 mg via ORAL
  Filled 2017-12-07 (×3): qty 2

## 2017-12-07 MED ORDER — FUROSEMIDE 10 MG/ML IJ SOLN
INTRAMUSCULAR | Status: AC
  Filled 2017-12-07: qty 4

## 2017-12-07 MED ORDER — ASPIRIN 81 MG PO CHEW: 81.0000 mg | CHEWABLE_TABLET | ORAL | Status: DC

## 2017-12-07 MED ORDER — SODIUM CHLORIDE 0.9 % WEIGHT BASED INFUSION: 3.0000 mL/kg/h | INTRAVENOUS | Status: DC

## 2017-12-07 MED ORDER — LIDOCAINE HCL (PF) 1 % IJ SOLN
INTRAMUSCULAR | Status: AC
  Filled 2017-12-07: qty 30

## 2017-12-07 MED ORDER — SODIUM CHLORIDE 0.9% FLUSH: 3.0000 mL | Freq: Two times a day (BID) | INTRAVENOUS | Status: DC

## 2017-12-07 MED ORDER — SODIUM CHLORIDE 0.9 % IV SOLN: 250.0000 mL | INTRAVENOUS | Status: DC | PRN

## 2017-12-07 MED ORDER — SODIUM CHLORIDE 0.9 % WEIGHT BASED INFUSION: 1.0000 mL/kg/h | INTRAVENOUS | Status: DC

## 2017-12-07 MED ORDER — SODIUM CHLORIDE 0.9% FLUSH: 3.0000 mL | INTRAVENOUS | Status: DC | PRN

## 2017-12-07 MED ORDER — CARVEDILOL 12.5 MG PO TABS
25.0000 mg | ORAL_TABLET | Freq: Two times a day (BID) | ORAL | Status: DC
  Administered 2017-12-07 – 2017-12-11 (×9): 25 mg via ORAL
  Filled 2017-12-07: qty 1
  Filled 2017-12-07: qty 2
  Filled 2017-12-07: qty 1
  Filled 2017-12-07: qty 2
  Filled 2017-12-07 (×5): qty 1

## 2017-12-07 MED ORDER — FUROSEMIDE 10 MG/ML IJ SOLN
INTRAMUSCULAR | Status: DC | PRN
  Administered 2017-12-07: 40 mg via INTRAVENOUS

## 2017-12-07 MED ORDER — ASPIRIN 81 MG PO CHEW
81.0000 mg | CHEWABLE_TABLET | ORAL | Status: AC
  Administered 2017-12-07: 81 mg via ORAL
  Filled 2017-12-07: qty 1

## 2017-12-07 MED FILL — Verapamil HCl IV Soln 2.5 MG/ML: INTRAVENOUS | Qty: 2 | Status: AC

## 2017-12-07 SURGICAL SUPPLY — 8 items
GLIDESHEATH SLEND A-KIT 6F 22G (SHEATH) IMPLANT
GUIDEWIRE INQWIRE 1.5J.035X260 (WIRE) IMPLANT
INQWIRE 1.5J .035X260CM (WIRE)
KIT ENCORE 26 ADVANTAGE (KITS) IMPLANT
KIT HEART LEFT (KITS) ×1 IMPLANT
PACK CARDIAC CATHETERIZATION (CUSTOM PROCEDURE TRAY) ×1 IMPLANT
TRANSDUCER W/STOPCOCK (MISCELLANEOUS) ×1 IMPLANT
TUBING CIL FLEX 10 FLL-RA (TUBING) ×1 IMPLANT

## 2017-12-07 NOTE — Progress Notes (Addendum)
Progress Note  Patient Name: [name] Date of Encounter: 12/07/2017  Primary Cardiologist: [name], MD  EP: Dr. Caryl Comes (follows Implantable Loop Recorder)  Subjective   Feeling ok today. Slight HA, but currently on IV nitro. No chest pain. Denies dyspnea. Awaiting 2nd cath for staged PCI.   Inpatient Medications    Scheduled Meds: . amLODipine  10 mg Oral Daily  . aspirin  81 mg Oral Pre-Cath  . aspirin EC  81 mg Oral Daily  . atorvastatin  80 mg Oral q1800  . carvedilol  25 mg Oral BID WC  . cloNIDine  0.3 mg Transdermal Q Fri  . clopidogrel  75 mg Oral Daily  . hydrALAZINE  100 mg Oral BID  . sodium chloride flush  3 mL Intravenous Q12H  . sodium chloride flush  3 mL Intravenous Q12H  . sodium chloride flush  3 mL Intravenous Q12H   Continuous Infusions: . sodium chloride    . sodium chloride    . sodium chloride    . sodium chloride Stopped (12/06/17 2056)  . sodium chloride     Followed by  . sodium chloride    . heparin 1,350 Units/hr (12/07/17 0540)  . nitroGLYCERIN 26.667 mcg/min (12/06/17 2312)   PRN Meds: sodium chloride, sodium chloride, sodium chloride, acetaminophen, hydrALAZINE, labetalol, morphine injection, nitroGLYCERIN, ondansetron (ZOFRAN) IV, sodium chloride flush, sodium chloride flush, sodium chloride flush   Vital Signs    Vitals:   12/07/17 0500 12/07/17 0800 12/07/17 0830 12/07/17 0900  BP: (!) 162/108 (!) 134/93 (!) 174/105   Pulse: 81 63 68 69  Resp: 13 11 11 16   Temp: 98.2 F (36.8 C)  98.2 F (36.8 C)   TempSrc: Oral  Oral   SpO2: 97% 96% 97% 98%  Weight: 67.2 kg     Height:        Intake/Output Summary (Last 24 hours) at 12/07/2017 1010 Last data filed at 12/07/2017 0540 Gross per 24 hour  Intake 1076.46 ml  Output 2065 ml  Net -988.54 ml   Filed Weights   12/05/17 0309 12/06/17 0349 12/07/17 0500  Weight: 68.3 kg 68.5 kg 67.2 kg    Telemetry    NSR - Personally Reviewed  ECG    Normal sinus rhythm,  Left axis deviation, ST & T wave abnormality, consider lateral ischemia - Personally Reviewed  Physical Exam   GEN: physically fit appearing, AAM in No acute distress.   Neck: No JVD Cardiac: RRR, loud clicking murmur, heard throughout anterior precardium and radiation to posterior thorax.    Respiratory: Clear to auscultation bilaterally. GI: Soft, nontender, non-distended  MS: No edema; No deformity. Neuro:  Nonfocal  Psych: Normal affect   Labs    Chemistry Recent Labs  Lab 12/02/17 1457  12/05/17 0321 12/06/17 0422 12/07/17 0358  NA 139   < > 139 139 137  K 4.0   < > 3.8 3.6 4.0  CL 108   < > 109 107 104  CO2 24   < > 21* 22 25  GLUCOSE 105*   < > 87 81 101*  BUN 18   < > 22* 18 18  CREATININE 2.07*   < > 1.93* 1.84* 1.94*  CALCIUM 8.9   < > 8.6* 8.9 9.2  PROT 6.6  --   --   --   --   ALBUMIN 3.4*  --   --   --   --   AST 18  --   --   --   --  ALT 10  --   --   --   --   ALKPHOS 83  --   --   --   --   BILITOT 0.8  --   --   --   --   GFRNONAA 37*   < > 40* 43* 40*  GFRAA 43*   < > 47* 49* 46*  ANIONGAP 7   < > 9 10 8    < > = values in this interval not displayed.     Hematology Recent Labs  Lab 12/05/17 0321 12/06/17 0404 12/07/17 0358  WBC 4.3 3.6* 5.2  RBC 3.90* 4.16* 4.21*  HGB 12.5* 13.0 13.2  HCT 37.6* 41.4 40.6  MCV 96.4 99.5 96.4  MCH 32.1 31.3 31.4  MCHC 33.2 31.4 32.5  RDW 14.0 14.4 14.1  PLT 126* 119* 139*    Cardiac Enzymes Recent Labs  Lab 12/02/17 1757 12/03/17 0653 12/03/17 1214  TROPONINI 2.14* 2.39* 1.77*    Recent Labs  Lab 12/02/17 1505  TROPIPOC 1.69*     BNPNo results for input(s): BNP, PROBNP in the last 168 hours.   DDimer No results for input(s): DDIMER in the last 168 hours.   Radiology    No results found.  Cardiac Studies    2D Echo 12/03/2017: Severe LVH -speckled appearance consider infiltrative myopathy.  EF 35 and 40% (reduced from previous).  Diffuse hypokinesis.  GR 1 DD.  High filling pressures.   Bioprosthetic aortic valve present.  Trivial regurgitation.  Mild MR.  Moderate LA dilation.  Mild RA dilation. ______________________  [facility] 12/06/17   Conclusion 95 % proximal ramus intermedius. Normal, widely patent LM Mid 30-50% LAD 40% proximal circumflex within a bend. Proximal RCA within a bend. No LV gram performed to decrease contrast exposure.  Recommendation Hydrate. Follow kidney function. Staged PCI on ramus in 1- 3 days depending on kidney function.  Plavix load.  Recommend dual antiplatelet therapy with Aspirin 81mg  dailylong-term (beyond 12 months) because of Plavix.  _________________________  Staged PCI 12/07/17 - pending    Patient Profile     [name] a 47 y.o. [name] past medical history of aortic dissection s/p Bentall procedure w/ tissue AVR at [facility], CAD w/ LHC in 2016 showing 60% ostial D1, severe hypertension, h/o left real artery stenting,  prior substance abuse, hyperlipidemia, h/o cryptogenic stroke (has implantable loop recorder) and chronic chest pain admitted for evaluation/managment of non-ST elevation myocardial infarction.  W/u this admission showed reduced LVEF, 35-40%, and single vessel obstructive CAD involving the RI. Plan is for staged PCI. Additional hospital problems include HTN and renal insuffiencey.   Assessment & Plan    1. NSTEMI: troponin peaked at 2.39 and now trending downward at 1.77. His CP was atypical and sharp/pleuritic in nature. 2D echo shows reduced LVEF, down to 35% (previously 55-60% in 2017) and diffuse hypokinesis.  Diagnostic LHC yesteday showed severe single vessel obstructive CAD with a 95% stenosed proximal ramus intermedius. Plan is for staged intervention today (PCI delayed 9/23 due to renal function). He is currently CP free and on IV heparin and nitro. He is getting IVF hydration. Hopeful PCI today. Continue ASA, statin and BB.     2.  Accelerated hypertension: Difficult to control despite multiple  antihypertensives. BB changed  from labetolol to Coreg, 25 mg BID. IV nitro also added post cath. He is also on amlodipine 10 mg, clonidine 0.3 patch and hydralazine 100 mg BID.  He dose have a h/o left  renal artery stenting and BP remains poorly controlled this morning at 181/123. Consider renal artery dopplers to reassess patency of stent and r/o RAS. Given reduced LVEF, may also consider addition of oral nitrate therapy, once IV nitro is discontinued, if able to tolerate (history of intolerance to Bidil- HA>>consider retrial w/ low dose Imdur). No ACE/ARB due to abnormal renal function.  can consider adding later once renal function improves.   3. Cardiomyopathy (likely hypertensive heart disease, not nonischemic -existing CAD lesion would not explain this reduced EF.): EF reduced, down from 55-60% in 2017 to now 35-40% with diffuse hypokinesis. LHC 9/23 showed single vessel obstructive CAD involving the RI as noted above, as well as mild-moderate, nonobstructive disease in the mid LAD (30-50% stenosis). 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  Also noted on echo is severe LVH and LV w/ speckled appearance. ? Infiltrative cardiomyopathy. ? Amyloid. May need a cardiac MRI. Will defer to MD. From a volume standpoint, he appears euvolemic. Telemetry reviewed. No arrhythmias. Continue medical management for systolic HF. He is on BB therapy with Coreg, 25 mg BID. No ACE/ARB or Entreso at this time given abnormal renal function. Continue hydralazine, 100 mg BID. Given he is AA, he may also benefit from addition of nitrate therapy (if able to tolerate w/o HA). Despite IVF hydration, his volume status appears stable currently, but will need close monitoring. Continue Strict I/Os. Fluid balance currently net negative 682 cc. Weight is down today compared to yesterday. Continue to monitor.   4. AKI:  pump in SCr in the last 24 hrs post  diagnostic LHC yesterday, up from 1.84>>1.94. Plan is for return to cath lab for staged PCI today. He is currently getting precath IVF hydration. Will need close monitoring of renal function post PCI. Scr peaked at 2.07 this admit. Lisinopril is on hold. Limit contrast dye exposure during cath. No LV gram. Continue to monitor. Baseline SCr ~1.6-1.8. He is followed by Dr. [name], nephrologist.   5. HLD: on statin therapy w/ Lipitor. Last LP on file is from 2017. LDL was elevated at 124 at that time. His Lipitor dose has been increased this admission from 20 to 80 mg. Goal LDL, in the setting of CAD is < 70 mg/dL.  Will repeat FLP to reassess baseline and again in 6-8 weeks, to check response on high dose and will also need HFTs in 6-8 weeks.    6. H/o Aortic Dissection: s/p Bentall Procedure with TEVAR. Followed at [facility]. CT of chest abd/pelvis this admit shows stable appearance of the proximal abdominal aorta status postgraft placement with stable grafts to the celiac axis, superior mesenteric artery and bilateral renal arteries. All of the visceral arteries are normally patent. The aneurysmal portion of the native aorta previously representing a patent false lumen after aortic dissection remains successfully excluded and thrombosed. Aortic diameter at this level is approximately 4.6 cm including the thrombosed excluded aorta. The distal aorta is normally patent. No evidence of acute dissection. Continue to work on BP control as outlined above.   7. S/P AVR: tissue aortic valve prosthesis, done at time of dissection repair. Echo this admit shows normal velocity (2.2 m/s) and trace AI.  8. Mitral Regurgitation: mild MR noted on echo.   9. CAD: per records, LHC in 2016 showed 60% D1. Diagnostic LHC yesterday with 95% proximal RI and mild-moderate 30-50% mid LAD (medical management). Plan for staged PCI  of RI today. Currently CP free. Continue IV heparin until cath. Continue medical therapy, w/ ASA,  statin and BB. Will check FLP. Goal LDL < 70 mg/dL.   7. H/o Cryptogenic Stroke: s/p implantable loop recorder, followed by Dr. Caryl Comes. Per records, no detection of afib.    MD to follow with further recommendations.      Signed, Lyda Jester, PA-C  12/07/2017, 10:10 AM     I have seen, examined and evaluated the patient this AM in the Cath Lab after being seen by Ellen Henri, PA-C.  After reviewing all the available data and chart, we discussed the patients laboratory, study & physical findings as well as symptoms in detail. I agree with her findings, examination as well as impression recommendations as per our discussion.    He was doing relatively well and seen by [place], however I saw him as he was going downstairs to the Cath Lab when he started noticing feeling dyspneic.  Definitely has orthopnea and JVD now on exam but no rales. Discussed with Dr. Tamala Julian.  We think the probably more his course of action is to give him some IV Lasix a day reassess and potentially another dose this afternoon.  We will hold off on IV fluid for now.  Place him on nasal cannula oxygen.  Reevaluate renal function tomorrow the next day to determine the best timing for staged PCI. This clearly indicates that he is susceptible to volume overload with his systolic and diastolic heart failure.  We will reassess his renal function tomorrow, but hold off on PCI today.   Has been loaded on Plavix, will continue 75 mg Plavix.  Restart IV heparin.  I have increased his carvedilol dose to 25 twice daily and his hydralazine 200 twice daily.    Glenetta Hew, M.D., M.S. Interventional Cardiologist   Pager # [phone] Phone # [phone] [address]. [place], [place] [address]    For questions or updates, please contact [place] Please consult [email or web address] for contact info under

## 2017-12-07 NOTE — Progress Notes (Signed)
After nitro 0.4 SL given Pt states "pain is better, just feeling sob". Brooke Therapist, sports at bedside.

## 2017-12-07 NOTE — Progress Notes (Signed)
Called to room pt having active chest pain. EKG done, Nitro x 1 given for pain of 7-10 to chest. BP: 180/98. 3L O2 on pt. Dr. Ellyn Hack at bedside upon arrival of pt to room. Cath lab RN at bedside.

## 2017-12-07 NOTE — H&P (Signed)
Came to lab for PCI attempt but was dyspneic. Suspect acute on chronic diastolic and systolic heart failure due to hydration and CKD. Procedure postponed for hydration and f/u kidney function.

## 2017-12-08 DIAGNOSIS — I5043 Acute on chronic combined systolic (congestive) and diastolic (congestive) heart failure: Secondary | ICD-10-CM

## 2017-12-08 HISTORY — DX: Acute on chronic combined systolic (congestive) and diastolic (congestive) heart failure: I50.43

## 2017-12-08 LAB — BASIC METABOLIC PANEL
ANION GAP: 11 (ref 5–15)
BUN: 24 mg/dL — ABNORMAL HIGH (ref 6–20)
CALCIUM: 9.3 mg/dL (ref 8.9–10.3)
CO2: 23 mmol/L (ref 22–32)
CREATININE: 2.2 mg/dL — AB (ref 0.61–1.24)
Chloride: 101 mmol/L (ref 98–111)
GFR calc Af Amer: 40 mL/min — ABNORMAL LOW (ref >60)
GFR, EST NON AFRICAN AMERICAN: 34 mL/min — AB (ref >60)
GLUCOSE: 99 mg/dL (ref 70–99)
Potassium: 3.9 mmol/L (ref 3.5–5.1)
Sodium: 135 mmol/L (ref 135–145)

## 2017-12-08 LAB — LIPID PANEL
CHOL/HDL RATIO: 3.2 ratio
CHOLESTEROL: 150 mg/dL (ref 0–200)
HDL: 47 mg/dL (ref >40)
LDL CALC: 89 mg/dL (ref 0–99)
TRIGLYCERIDES: 72 mg/dL (ref <150)
VLDL: 14 mg/dL (ref 0–40)

## 2017-12-08 LAB — HEPARIN LEVEL (UNFRACTIONATED): Heparin Unfractionated: 0.54 IU/mL (ref 0.30–0.70)

## 2017-12-08 MED ORDER — HYDRALAZINE HCL 50 MG PO TABS
100.0000 mg | ORAL_TABLET | Freq: Three times a day (TID) | ORAL | Status: DC
  Administered 2017-12-08 – 2017-12-11 (×8): 100 mg via ORAL
  Filled 2017-12-08 (×9): qty 2

## 2017-12-08 MED ORDER — MINOXIDIL 2.5 MG PO TABS
2.5000 mg | ORAL_TABLET | Freq: Every day | ORAL | Status: DC
  Administered 2017-12-08 – 2017-12-11 (×4): 2.5 mg via ORAL
  Filled 2017-12-08 (×5): qty 1

## 2017-12-08 MED ORDER — FUROSEMIDE 40 MG PO TABS
40.0000 mg | ORAL_TABLET | Freq: Every day | ORAL | Status: DC
  Administered 2017-12-08 – 2017-12-09 (×2): 40 mg via ORAL
  Filled 2017-12-08 (×3): qty 1

## 2017-12-08 MED ORDER — SODIUM CHLORIDE 0.9 % IV SOLN
INTRAVENOUS | Status: AC
  Administered 2017-12-08: 10:00:00 via INTRAVENOUS

## 2017-12-08 NOTE — Progress Notes (Signed)
Progress Note 12/08/2017, 10:10 AM    Patient Name: [name] Date of Encounter: 12/08/2017  Primary Cardiologist: Andre Champagne, MD  EP: Dr. Caryl Comes (follows Implantable Loop Recorder)  Subjective   Stage PCI yesterday was aborted due to exacerbation of his CHF with dyspnea following IV fluid infusion for renal protection. The was returned to the unit and given IV Lasix as well as additional blood pressure agents.  Today he feels pretty much better.  His breathing is notably improved.  He still has a headache and is quite concerned about his kidney function.  Inpatient Medications    Scheduled Meds: . amLODipine  10 mg Oral Daily  . aspirin EC  81 mg Oral Daily  . atorvastatin  80 mg Oral q1800  . carvedilol  25 mg Oral BID WC  . cloNIDine  0.3 mg Transdermal Q Fri  . clopidogrel  75 mg Oral Daily  . furosemide  40 mg Oral Daily  . hydrALAZINE  100 mg Oral TID  . minoxidil  2.5 mg Oral Daily   Continuous Infusions: . sodium chloride    . sodium chloride    . heparin 1,350 Units/hr (12/08/17 0800)  . nitroGLYCERIN 25 mcg/min (12/08/17 0800)   PRN Meds: sodium chloride, acetaminophen, hydrALAZINE, labetalol, morphine injection, nitroGLYCERIN, ondansetron (ZOFRAN) IV   Vital Signs    Vitals:   12/08/17 0822 12/08/17 0826 12/08/17 0830 12/08/17 1000  BP: (!) 181/110 (!) 158/106 (!) 152/98 (!) 170/104  Pulse: 72 71 74 78  Resp: 12 19 12 18   Temp:      TempSrc:      SpO2: 98% 100% 99% 98%  Weight:      Height:        Intake/Output Summary (Last 24 hours) at 12/08/2017 1010 Last data filed at 12/08/2017 0800 Gross per 24 hour  Intake 776.2 ml  Output 720 ml  Net 56.2 ml   Filed Weights   12/07/17 0500 12/07/17 1005 12/08/17 0300  Weight: 67.2 kg 67.2 kg 65.2 kg    Telemetry    NSR - Personally Reviewed  ECG    Normal sinus rhythm, Left axis deviation, ST & T wave abnormality, consider lateral ischemia - Personally Reviewed  Physical Exam    Physical Exam  Constitutional: He is oriented to person, place, and time. He appears well-developed and well-nourished. No distress.  Fit, healthy-appearing.  HENT:  Head: Normocephalic and atraumatic.  Neck: No hepatojugular reflux and no JVD present. Carotid bruit is not present.  Cardiovascular: Normal rate, regular rhythm and intact distal pulses.  No extrasystoles are present. PMI is not displaced. Exam reveals gallop and S4.  Murmur heard.  Harsh crescendo-decrescendo midsystolic murmur is present with a grade of 3/6 at the upper right sternal border radiating to the neck. Normal S1 with crisp S2  Pulmonary/Chest: Breath sounds normal. No respiratory distress. He has no wheezes. He has no rales.  Abdominal: Soft. Bowel sounds are normal. He exhibits no distension. There is no tenderness.  Musculoskeletal: Normal range of motion. He exhibits no edema.  Neurological: He is alert and oriented to person, place, and time.  Psychiatric: He has a normal mood and affect. His behavior is normal. Judgment and thought content normal.  Vitals reviewed.  Labs    Chemistry Recent Labs  Lab 12/02/17 1457  12/06/17 0422 12/07/17 0358 12/08/17 0325  NA 139   < > 139 137 135  K 4.0   < > 3.6 4.0 3.9  CL  108   < > 107 104 101  CO2 24   < > 22 25 23   GLUCOSE 105*   < > 81 101* 99  BUN 18   < > 18 18 24*  CREATININE 2.07*   < > 1.84* 1.94* 2.20*  CALCIUM 8.9   < > 8.9 9.2 9.3  PROT 6.6  --   --   --   --   ALBUMIN 3.4*  --   --   --   --   AST 18  --   --   --   --   ALT 10  --   --   --   --   ALKPHOS 83  --   --   --   --   BILITOT 0.8  --   --   --   --   GFRNONAA 37*   < > 43* 40* 34*  GFRAA 43*   < > 49* 46* 40*  ANIONGAP 7   < > 10 8 11    < > = values in this interval not displayed.     Hematology Recent Labs  Lab 12/05/17 0321 12/06/17 0404 12/07/17 0358  WBC 4.3 3.6* 5.2  RBC 3.90* 4.16* 4.21*  HGB 12.5* 13.0 13.2  HCT 37.6* 41.4 40.6  MCV 96.4 99.5 96.4  MCH  32.1 31.3 31.4  MCHC 33.2 31.4 32.5  RDW 14.0 14.4 14.1  PLT 126* 119* 139*    Cardiac Enzymes Recent Labs  Lab 12/02/17 1757 12/03/17 0653 12/03/17 1214  TROPONINI 2.14* 2.39* 1.77*    Recent Labs  Lab 12/02/17 1505  TROPIPOC 1.69*     BNPNo results for input(s): BNP, PROBNP in the last 168 hours.   DDimer No results for input(s): DDIMER in the last 168 hours.   Radiology    No results found.  Cardiac Studies    2D Echo 12/03/2017: Severe LVH -speckled appearance consider infiltrative myopathy.  EF 35 and 40% (reduced from previous).  Diffuse hypokinesis.  GR 1 DD.  High filling pressures.  Bioprosthetic aortic valve present.  Trivial regurgitation.  Mild MR.  Moderate LA dilation.  Mild RA dilation. ______________________   LHC 12/06/17 : 95%pRI.  30-50%m LAD.  40%pCx  Staged PCI pending stabilization of renal function  Patient Profile     [name] a 47 y.o. [name] past medical history of aortic dissection s/p Bentall procedure w/ tissue AVR at [facility], CAD w/ LHC in 2016 showing 60% ostial D1, severe hypertension, h/o left real artery stenting,  prior substance abuse, hyperlipidemia, h/o cryptogenic stroke (has implantable loop recorder) and chronic chest pain admitted for evaluation/managment of non-ST elevation myocardial infarction.  W/u this admission showed reduced LVEF, 35-40%, and single vessel obstructive CAD involving the RI. Plan is for staged PCI. Additional hospital problems include HTN and renal insuffiencey.   Assessment & Plan    Principal Problem:   NSTEMI (non-ST elevated myocardial infarction) ([place]) Active Problems:   Hypertensive cardiomyopathy, without heart failure ([place]): EF reduced to 35-40% from 55%.   Malignant hypertension   Coronary artery disease involving native coronary artery of native heart with angina pectoris (HCC)   Hyperlipidemia LDL goal <70   Tobacco use   Aortic valve disorder   Acute renal failure  superimposed on stage 3 chronic kidney disease (HCC)   Aneurysm of ascending aorta (HCC)   Aortic dissection (HCC)   H/O aortic valve replacement   HLD (hyperlipidemia)  Principal Problem:  NSTEMI (non-ST elevated myocardial infarction) ([place]) W/Coronary artery disease involving native coronary artery of native heart with angina pectoris ([place])  Catheter reveals 95% proximal ramus intermedius.  Planning staged PCI once his renal function stabilizes.  We will give gentle IV fluids today in addition to Lasix oral -> reassess creatinine in the morning tentatively scheduled PCI  Is on aspirin plus Plavix having been loaded with Plavix post-cath day.    Malignant hypertension /Hypertensive cardiomyopathy, without heart failure ([place]): EF reduced to 35-40% from 55%. --Complicated by acute on chronic combined systolic and diastolic heart failure yesterday  Blood pressure remains very difficult to control on max dose carvedilol, amlodipine, 100 mg twice daily hydralazine and nitroglycerin infusion.  Will increase hydralazine to 3 times daily and start minoxidil today  Not able to use ARB/ACE inhibitor or even Entresto because of renal insufficiency. ->  Using hydralazine and currently IV nitroglycerin for afterload reduction.  Unfortunately he has intolerance of long-acting nitrates and thus making Imdur or Isordil not viable options.  Add Lasix 40 mg daily to avoid volume overload.  Until yesterday, he did not have signs of CHF.   Also noted on echo is severe LVH and LV w/ speckled appearance. ? Infiltrative cardiomyopathy. ? Amyloid. May need a cardiac MRI but can be done outpatient  We will check renal artery Dopplers today -Dopplers were normal in 2012 and 2016      Hyperlipidemia LDL goal <70   Tobacco use cessation counseling.  Has not had a cigarette craving in the hospital     Acute renal failure superimposed on stage 3 chronic kidney disease ([place]):  Baseline SCr ~1.6-1.8. He is  followed by Dr. [name], nephrologist.   I suspect this is a combination of his underlying renal insufficiency and contrast-induced nephropathy ([place])  This is a day but we will probably see the effect of her On Monday.  Will need to continue to monitor.  Will gently hydrate today with IV fluids but also add oral Lasix to avoid volume overload yesterday.    Aortic dissection (HCC) / Aneurysm of ascending aorta (HCC) -partial endovascular and partial open repair.  Large stent graft noted on    Aortic valve disorder -  H/O aortic valve replacement --.  Appears okay on echo    HLD (hyperlipidemia)   High-dose atorvastatin  H/o Cryptogenic Stroke: s/p implantable loop recorder, followed by Dr. Caryl Comes. Per records, no detection of afib.    Signed, Glenetta Hew, MD   Glenetta Hew, M.D., M.S. Interventional Cardiologist   Pager # [phone] Phone # [phone] [address] [place], [place] [address]    For questions or updates, please contact [place] Please consult [email or web address] for contact info under

## 2017-12-08 NOTE — Progress Notes (Signed)
ANTICOAGULATION CONSULT NOTE - Follow Up Consult  Pharmacy Consult for heparin Indication: chest pain/ACS  Labs: Recent Labs    12/06/17 0404 12/06/17 0422 12/07/17 0358 12/07/17 1141 12/08/17 0325  HGB 13.0  --  13.2  --   --   HCT 41.4  --  40.6  --   --   PLT 119*  --  139*  --   --   HEPARINUNFRC 0.39  --  0.21* 0.35 0.54  CREATININE  --  1.84* 1.94*  --  2.20*    Assessment: 47 yo M with PMH of aortic dissection repair (bioprosthetic valve 04/2014, no PTA anticoagulation) admitted for chest pain. Pharmacy has been consulted for heparin dosing for NSTEMI.   Cath cancelled yesterday to sob in lab. Heparin level continues to be at goal this morning. Plans are to reconsider cath in am  Goal of Therapy:  Heparin level 0.3-0.7 units/ml Monitor platelets by anticoagulation protocol: Yes   Plan:  Continue IV heparin at 1250 units/hr Monitor daily heparin level and CBC  Erin Hearing PharmD., BCPS Clinical Pharmacist 12/08/2017 11:49 AM

## 2017-12-08 NOTE — Progress Notes (Addendum)
During bedside reporting pt complained of right arm pain, with some tightness in right hand fingers. Denies any numbness and tingling, or chest pain. Able to move fingers, cap refill <3 sec. Breathing even and unlabored in 2l o2 via [place].  Right radial heart cath site pulsating, +3 palpable pulse with a pea size clot. The clot has gotten bigger since last time per day shift RN, She administered prn morphine. Right arm elevated in the pillow, clot marked. When  Rechecked in 30 min, pt stated his right arm feels better. Insisted to remove piv from right forearm and start new one  on left arm , but pt refused and stated its not bothering him, family in the room. Call light within reach.

## 2017-12-08 NOTE — Progress Notes (Signed)
Pt started bleeding from his right radial heart cath site, pressure held for 8 min till hemostasis obtained. Heparin running at 13.32ml/hr. Pt has strong pulse +3 radial pulse. NO complain of numbness/tingling sensation. Cap refill < 3 sec. Gauze and medipore tape applied to the site, elevated. Call light in reach. Will continue to monitor.

## 2017-12-09 LAB — BASIC METABOLIC PANEL
Anion gap: 8 (ref 5–15)
BUN: 25 mg/dL — ABNORMAL HIGH (ref 6–20)
CO2: 23 mmol/L (ref 22–32)
Calcium: 9.1 mg/dL (ref 8.9–10.3)
Chloride: 105 mmol/L (ref 98–111)
Creatinine, Ser: 2.14 mg/dL — ABNORMAL HIGH (ref 0.61–1.24)
GFR calc Af Amer: 41 mL/min — ABNORMAL LOW (ref >60)
GFR calc non Af Amer: 36 mL/min — ABNORMAL LOW (ref >60)
Glucose, Bld: 97 mg/dL (ref 70–99)
Potassium: 4.2 mmol/L (ref 3.5–5.1)
Sodium: 136 mmol/L (ref 135–145)

## 2017-12-09 LAB — CBC
HCT: 41.8 % (ref 39.0–52.0)
Hemoglobin: 13.5 g/dL (ref 13.0–17.0)
MCH: 31.5 pg (ref 26.0–34.0)
MCHC: 32.3 g/dL (ref 30.0–36.0)
MCV: 97.7 fL (ref 78.0–100.0)
Platelets: 142 10*3/uL — ABNORMAL LOW (ref 150–400)
RBC: 4.28 MIL/uL (ref 4.22–5.81)
RDW: 13.8 % (ref 11.5–15.5)
WBC: 4.4 10*3/uL (ref 4.0–10.5)

## 2017-12-09 LAB — HEPARIN LEVEL (UNFRACTIONATED): Heparin Unfractionated: 0.63 IU/mL (ref 0.30–0.70)

## 2017-12-09 MED ORDER — HYDROCODONE-ACETAMINOPHEN 5-325 MG PO TABS
1.0000 | ORAL_TABLET | Freq: Three times a day (TID) | ORAL | Status: DC | PRN
  Administered 2017-12-09: 1 via ORAL
  Filled 2017-12-09: qty 1

## 2017-12-09 MED ORDER — POLYETHYLENE GLYCOL 3350 17 G PO PACK
17.0000 g | PACK | Freq: Every day | ORAL | Status: DC | PRN
  Administered 2017-12-09 – 2017-12-11 (×2): 17 g via ORAL
  Filled 2017-12-09 (×2): qty 1

## 2017-12-09 NOTE — Progress Notes (Signed)
ANTICOAGULATION CONSULT NOTE - Follow Up Consult  Pharmacy Consult for heparin Indication: chest pain/ACS  Labs: Recent Labs    12/07/17 0358 12/07/17 1141 12/08/17 0325 12/09/17 0307  HGB 13.2  --   --  13.5  HCT 40.6  --   --  41.8  PLT 139*  --   --  142*  HEPARINUNFRC 0.21* 0.35 0.54 0.63  CREATININE 1.94*  --  2.20* 2.14*    Assessment: 47 yo M with PMH of aortic dissection repair (bioprosthetic valve 04/2014, no PTA anticoagulation) admitted for chest pain. Pharmacy has been consulted for heparin dosing for NSTEMI.   Cath cancelled 9/24 d/t sob in lab. Heparin level remains at goal today. Plans are to reconsider cath as renal function improves and respiratory status stabilizes. CBC stable, no bleeding issues noted.   Goal of Therapy:  Heparin level 0.3-0.7 units/ml Monitor platelets by anticoagulation protocol: Yes   Plan:  Continue IV heparin at 1250 units/hr Monitor daily heparin level and CBC  Erin Hearing PharmD., BCPS Clinical Pharmacist 12/09/2017 12:15 PM

## 2017-12-09 NOTE — Progress Notes (Signed)
Received report from RN; Introduce staff to pt; Review care plan; VSS; IV fluids running; A/O x 4; Pain 3/10; Bed low position; Call bell within reach; Family visiting; Will continue to monitor.

## 2017-12-09 NOTE — Progress Notes (Signed)
Received report from RN;VSS; A/O x 4; IV fluids running; Pain 3/10; No c/o of chest pain; No respiratory distress; Family visiting; Will continue to monitor.

## 2017-12-09 NOTE — Progress Notes (Signed)
Progress Note 12/08/2017, 10:10 AM    Patient Name: [name] Date of Encounter: 12/08/2017  Primary Cardiologist: Andre Champagne, MD  EP: Dr. Caryl Ewing (follows Implantable Loop Recorder)  Subjective   Feeling better today.  His only complaint is that he has some fullness in his right ear making it hard to hear.  A little bit better now than when he saw Andre Deforest, PA an hour ago. No further chest pain or dyspnea.  His blood pressures are great and he feels very happy about this.  Inpatient Medications    Scheduled Meds: . amLODipine  10 mg Oral Daily  . aspirin EC  81 mg Oral Daily  . atorvastatin  80 mg Oral q1800  . carvedilol  25 mg Oral BID WC  . cloNIDine  0.3 mg Transdermal Q Fri  . clopidogrel  75 mg Oral Daily  . furosemide  40 mg Oral Daily  . hydrALAZINE  100 mg Oral TID  . minoxidil  2.5 mg Oral Daily   Continuous Infusions: . sodium chloride    . sodium chloride    . heparin 1,350 Units/hr (12/08/17 0800)  . nitroGLYCERIN 25 mcg/min (12/08/17 0800)   PRN Meds: sodium chloride, acetaminophen, hydrALAZINE, labetalol, morphine injection, nitroGLYCERIN, ondansetron (ZOFRAN) IV   Vital Signs    Vitals:   12/08/17 0822 12/08/17 0826 12/08/17 0830 12/08/17 1000  BP: (!) 181/110 (!) 158/106 (!) 152/98 (!) 170/104  Pulse: 72 71 74 78  Resp: 12 19 12 18   Temp:      TempSrc:      SpO2: 98% 100% 99% 98%  Weight:      Height:        Intake/Output Summary (Last 24 hours) at 12/08/2017 1010 Last data filed at 12/08/2017 0800 Gross per 24 hour  Intake 776.2 ml  Output 720 ml  Net 56.2 ml   Filed Weights   12/07/17 0500 12/07/17 1005 12/08/17 0300  Weight: 67.2 kg 67.2 kg 65.2 kg    Telemetry    NSR - Personally Reviewed  ECG    No new EKG  Physical Exam   Physical Exam  Constitutional: He is oriented to person, place, and time. He appears well-developed and well-nourished. No distress.  Healthy-appearing.  HENT:  Head: Normocephalic and  atraumatic.  Notes somewhat decreased hearing in the right ear, improved with clear nasal passage using ear popping technique  Neck: No hepatojugular reflux and no JVD present. Carotid bruit is not present.  Cardiovascular: Normal rate, regular rhythm and intact distal pulses.  No extrasystoles are present. PMI is not displaced. Exam reveals gallop and S4.  Murmur heard.  Harsh crescendo-decrescendo midsystolic murmur is present with a grade of 3/6 at the upper right sternal border radiating to the neck. Normal S1 with crisp S2  Pulmonary/Chest: Breath sounds normal. No respiratory distress. He has no wheezes. He has no rales.  Musculoskeletal: Normal range of motion. He exhibits no edema.  Neurological: He is alert and oriented to person, place, and time.  Psychiatric: He has a normal mood and affect. His behavior is normal. Judgment and thought content normal.  Happy mood  Vitals reviewed.  Labs    Chemistry Recent Labs  Lab 12/02/17 1457  12/06/17 0422 12/07/17 0358 12/08/17 0325  NA 139   < > 139 137 135  K 4.0   < > 3.6 4.0 3.9  CL 108   < > 107 104 101  CO2 24   < > 22  25 23  GLUCOSE 105*   < > 81 101* 99  BUN 18   < > 18 18 24*  CREATININE 2.07*   < > 1.84* 1.94* 2.20*  CALCIUM 8.9   < > 8.9 9.2 9.3  PROT 6.6  --   --   --   --   ALBUMIN 3.4*  --   --   --   --   AST 18  --   --   --   --   ALT 10  --   --   --   --   ALKPHOS 83  --   --   --   --   BILITOT 0.8  --   --   --   --   GFRNONAA 37*   < > 43* 40* 34*  GFRAA 43*   < > 49* 46* 40*  ANIONGAP 7   < > 10 8 11    < > = values in this interval not displayed.     Hematology Recent Labs  Lab 12/05/17 0321 12/06/17 0404 12/07/17 0358  WBC 4.3 3.6* 5.2  RBC 3.90* 4.16* 4.21*  HGB 12.5* 13.0 13.2  HCT 37.6* 41.4 40.6  MCV 96.4 99.5 96.4  MCH 32.1 31.3 31.4  MCHC 33.2 31.4 32.5  RDW 14.0 14.4 14.1  PLT 126* 119* 139*    Cardiac Enzymes Recent Labs  Lab 12/02/17 1757 12/03/17 0653 12/03/17 1214    TROPONINI 2.14* 2.39* 1.77*    Recent Labs  Lab 12/02/17 1505  TROPIPOC 1.69*     BNPNo results for input(s): BNP, PROBNP in the last 168 hours.   DDimer No results for input(s): DDIMER in the last 168 hours.   Radiology    No results found.  Cardiac Studies    2D Echo 12/03/2017: Severe LVH -speckled appearance consider infiltrative myopathy.  EF 35 and 40% (reduced from previous).  Diffuse hypokinesis.  GR 1 DD.  High filling pressures.  Bioprosthetic aortic valve present.  Trivial regurgitation.  Mild MR.  Moderate LA dilation.  Mild RA dilation. ______________________   LHC 12/06/17 : 95%pRI.  30-50%m LAD.  40%pCx  Staged PCI pending stabilization of renal function  Patient Profile     [name] a 47 y.o. [name] past medical history of aortic dissection s/p Bentall procedure w/ tissue AVR at [facility], CAD w/ LHC in 2016 showing 60% ostial D1, severe hypertension, h/o left real artery stenting,  prior substance abuse, hyperlipidemia, h/o cryptogenic stroke (has implantable loop recorder) and chronic chest pain admitted for evaluation/managment of non-ST elevation myocardial infarction.  W/u this admission showed reduced LVEF, 35-40%, and single vessel obstructive CAD involving the RI. Plan is for staged PCI. Additional hospital problems include HTN and renal insuffiencey.   Assessment & Plan    Principal Problem:   NSTEMI (non-ST elevated myocardial infarction) ([place]) Active Problems:   Hypertensive cardiomyopathy, without heart failure ([place]): EF reduced to 35-40% from 55%.   Malignant hypertension   Coronary artery disease involving native coronary artery of native heart with angina pectoris (HCC)   Hyperlipidemia LDL goal <70   Tobacco use   Aortic valve disorder   Acute renal failure superimposed on stage 3 chronic kidney disease (HCC)   Aneurysm of ascending aorta (HCC)   Aortic dissection (HCC)   H/O aortic valve replacement   HLD  (hyperlipidemia)  Principal Problem:   NSTEMI (non-ST elevated myocardial infarction) ([place]) W/Coronary artery disease involving native coronary artery of native  heart with angina pectoris (HCC) --95% proximal RI noted on cath  Planning staged PCI once his renal function stabilizes. -Anticipate that this should be tomorrow at noon.  Will reassess creatinine in the morning.  Continue IV nitroglycerin and IV heparin until cath.  Continue aspirin and Plavix.  Scheduled for cath tomorrow afternoon, will start gentle IV fluids in the morning.  N.p.o. after breakfast    Malignant hypertension /Hypertensive cardiomyopathy, without heart failure ([place]): EF reduced to 35-40% from 55%. --Most likely hypertensive cardiomyopathy, however echo revealed severe LVH and LV w/ speckled appearance. ? Infiltrative cardiomyopathy. ? Amyloid. May need a cardiac MRI but can be done outpatient --Complicated by acute on chronic combined systolic and diastolic heart failure on 12/07/2017  BP finally better controlled on high-dose carvedilol, amlodipine, hydralazine and nitroglycerin infusion (we will convert from nitro glycerin infusion to 60 mg nightly Imdur.  Yesterday hydralazine was titrated 200 mg 3 times daily, we also added minoxidil.  Would like to see renal function stabilized, at which point we could potentially consider ARB versus Entresto in the outpatient setting.  Continue low-dose standing oral Lasix.  Did not have renal artery stenosis on initial evaluation.      Hyperlipidemia LDL goal <70  --on 80 mg atorvastatin   Tobacco use cessation counseling.  Has not had a cigarette craving in the hospital -we will discuss possible use of patch in the outpatient setting.     Acute renal failure superimposed on stage 3 chronic kidney disease ([place]):  Baseline SCr ~1.6-1.8. He is followed by Dr. [name], nephrologist. --ARF most likely related to hypertension and possible mild contrast-induced nephropathy  (CIN).  Creatinine has reached its peak now trending back down again.  I suspect that by tomorrow should be stable enough for cardiac catheterization.  Will likely need to monitor over the weekend post PCI to ensure stable creatinine    Aortic dissection (HCC) / Aneurysm of ascending aorta ([place]) -partial endovascular and partial open repair.  Large aortic stent graft noted on cath     Aortic valve disorder -  H/O aortic valve replacement --.  Appears okay on echo  H/o Cryptogenic Stroke: s/p implantable loop recorder, followed by Dr. Caryl Ewing. Per records, no detection of afib.   Nitroglycerin related to headache.  Will add low-dose Norco.   Signed, Glenetta Hew, MD   Glenetta Hew, M.D., M.S. Interventional Cardiologist   Pager # [phone] Phone # [phone] [address] [place], [place] [address]    For questions or updates, please contact [place] Please consult [email or web address] for contact info under

## 2017-12-10 ENCOUNTER — Other Ambulatory Visit: Payer: Self-pay

## 2017-12-10 ENCOUNTER — Encounter (HOSPITAL_COMMUNITY)
Admission: EM | Disposition: A | Discharge status: Home / Self Care | Payer: Self-pay | Source: Home / Self Care | Attending: Cardiology

## 2017-12-10 DIAGNOSIS — N170 Acute kidney failure with tubular necrosis: Secondary | ICD-10-CM

## 2017-12-10 DIAGNOSIS — N183 Chronic kidney disease, stage 3 (moderate): Secondary | ICD-10-CM

## 2017-12-10 HISTORY — PX: CORONARY STENT INTERVENTION: CATH118234

## 2017-12-10 LAB — CBC
HCT: 38.6 % — ABNORMAL LOW (ref 39.0–52.0)
Hemoglobin: 12.5 g/dL — ABNORMAL LOW (ref 13.0–17.0)
MCH: 31.4 pg (ref 26.0–34.0)
MCHC: 32.4 g/dL (ref 30.0–36.0)
MCV: 97 fL (ref 78.0–100.0)
Platelets: 148 10*3/uL — ABNORMAL LOW (ref 150–400)
RBC: 3.98 MIL/uL — ABNORMAL LOW (ref 4.22–5.81)
RDW: 13.4 % (ref 11.5–15.5)
WBC: 4.9 10*3/uL (ref 4.0–10.5)

## 2017-12-10 LAB — BASIC METABOLIC PANEL
Anion gap: 10 (ref 5–15)
BUN: 30 mg/dL — AB (ref 6–20)
CHLORIDE: 101 mmol/L (ref 98–111)
CO2: 25 mmol/L (ref 22–32)
CREATININE: 2.23 mg/dL — AB (ref 0.61–1.24)
Calcium: 9.5 mg/dL (ref 8.9–10.3)
GFR calc Af Amer: 39 mL/min — ABNORMAL LOW (ref >60)
GFR calc non Af Amer: 34 mL/min — ABNORMAL LOW (ref >60)
Glucose, Bld: 93 mg/dL (ref 70–99)
Potassium: 3.9 mmol/L (ref 3.5–5.1)
Sodium: 136 mmol/L (ref 135–145)

## 2017-12-10 LAB — POCT ACTIVATED CLOTTING TIME: Activated Clotting Time: 340 seconds

## 2017-12-10 LAB — TROPONIN I: TROPONIN I: 0.11 ng/mL — AB (ref <0.03)

## 2017-12-10 LAB — HEPARIN LEVEL (UNFRACTIONATED): Heparin Unfractionated: 0.58 IU/mL (ref 0.30–0.70)

## 2017-12-10 SURGERY — CORONARY STENT INTERVENTION
Anesthesia: LOCAL

## 2017-12-10 MED ORDER — HEPARIN (PORCINE) IN NACL 1000-0.9 UT/500ML-% IV SOLN
INTRAVENOUS | Status: AC
  Filled 2017-12-10: qty 1000

## 2017-12-10 MED ORDER — SODIUM CHLORIDE 0.9 % IV SOLN: 250.0000 mL | INTRAVENOUS | Status: DC | PRN

## 2017-12-10 MED ORDER — FUROSEMIDE 10 MG/ML IJ SOLN
40.0000 mg | Freq: Once | INTRAMUSCULAR | Status: AC
  Administered 2017-12-10: 40 mg via INTRAVENOUS
  Filled 2017-12-10: qty 4

## 2017-12-10 MED ORDER — ASPIRIN 81 MG PO CHEW: 81.0000 mg | CHEWABLE_TABLET | Freq: Every day | ORAL | Status: DC

## 2017-12-10 MED ORDER — CLOPIDOGREL BISULFATE 75 MG PO TABS
75.0000 mg | ORAL_TABLET | Freq: Every day | ORAL | Status: DC
  Administered 2017-12-11: 09:00:00 75 mg via ORAL
  Filled 2017-12-10: qty 1

## 2017-12-10 MED ORDER — SODIUM CHLORIDE 0.9 % IV SOLN
INTRAVENOUS | Status: DC
  Administered 2017-12-10: 07:00:00 via INTRAVENOUS

## 2017-12-10 MED ORDER — ISOSORBIDE MONONITRATE ER 60 MG PO TB24
60.0000 mg | ORAL_TABLET | Freq: Every day | ORAL | Status: DC
  Administered 2017-12-10 – 2017-12-11 (×2): 60 mg via ORAL
  Filled 2017-12-10 (×2): qty 1

## 2017-12-10 MED ORDER — BIVALIRUDIN TRIFLUOROACETATE 250 MG IV SOLR
INTRAVENOUS | Status: AC
  Filled 2017-12-10: qty 250

## 2017-12-10 MED ORDER — MIDAZOLAM HCL 2 MG/2ML IJ SOLN
INTRAMUSCULAR | Status: AC
  Filled 2017-12-10: qty 2

## 2017-12-10 MED ORDER — SODIUM CHLORIDE 0.9 % IV SOLN
INTRAVENOUS | Status: DC
  Administered 2017-12-11: via INTRAVENOUS

## 2017-12-10 MED ORDER — FENTANYL CITRATE (PF) 100 MCG/2ML IJ SOLN
INTRAMUSCULAR | Status: AC
  Filled 2017-12-10: qty 2

## 2017-12-10 MED ORDER — SODIUM CHLORIDE 0.9% FLUSH
3.0000 mL | Freq: Two times a day (BID) | INTRAVENOUS | Status: DC
  Administered 2017-12-10: 3 mL via INTRAVENOUS

## 2017-12-10 MED ORDER — OXYCODONE-ACETAMINOPHEN 5-325 MG PO TABS
1.0000 | ORAL_TABLET | Freq: Four times a day (QID) | ORAL | Status: DC | PRN
  Administered 2017-12-10 (×2): 1 via ORAL
  Filled 2017-12-10 (×2): qty 1

## 2017-12-10 MED ORDER — HYDRALAZINE HCL 20 MG/ML IJ SOLN: 5.0000 mg | INTRAMUSCULAR | Status: AC | PRN

## 2017-12-10 MED ORDER — NITROGLYCERIN 1 MG/10 ML FOR IR/CATH LAB
INTRA_ARTERIAL | Status: DC | PRN
  Administered 2017-12-10 (×2): 100 ug via INTRACORONARY

## 2017-12-10 MED ORDER — NITROGLYCERIN 1 MG/10 ML FOR IR/CATH LAB
INTRA_ARTERIAL | Status: AC
  Filled 2017-12-10: qty 10

## 2017-12-10 MED ORDER — SODIUM CHLORIDE 0.9 % IV SOLN
INTRAVENOUS | Status: AC | PRN
  Administered 2017-12-10 (×2): 1.75 mg/kg/h via INTRAVENOUS

## 2017-12-10 MED ORDER — VERAPAMIL HCL 2.5 MG/ML IV SOLN
INTRAVENOUS | Status: DC | PRN
  Administered 2017-12-10: 10 mL via INTRA_ARTERIAL

## 2017-12-10 MED ORDER — LABETALOL HCL 5 MG/ML IV SOLN: 10.0000 mg | INTRAVENOUS | Status: AC | PRN

## 2017-12-10 MED ORDER — CLOPIDOGREL BISULFATE 75 MG PO TABS
ORAL_TABLET | ORAL | Status: AC
  Filled 2017-12-10: qty 1

## 2017-12-10 MED ORDER — ACETAMINOPHEN 325 MG PO TABS
650.0000 mg | ORAL_TABLET | ORAL | Status: DC | PRN
  Administered 2017-12-11: 02:00:00 650 mg via ORAL
  Filled 2017-12-10: qty 2

## 2017-12-10 MED ORDER — HEPARIN (PORCINE) IN NACL 1000-0.9 UT/500ML-% IV SOLN
INTRAVENOUS | Status: AC
  Filled 2017-12-10: qty 500

## 2017-12-10 MED ORDER — ONDANSETRON HCL 4 MG/2ML IJ SOLN: 4.0000 mg | Freq: Four times a day (QID) | INTRAMUSCULAR | Status: DC | PRN

## 2017-12-10 MED ORDER — BIVALIRUDIN BOLUS VIA INFUSION - CUPID
INTRAVENOUS | Status: DC | PRN
  Administered 2017-12-10: 49.95 mg via INTRAVENOUS

## 2017-12-10 MED ORDER — LIDOCAINE HCL (PF) 1 % IJ SOLN
INTRAMUSCULAR | Status: DC | PRN
  Administered 2017-12-10: 2 mL via INTRADERMAL

## 2017-12-10 MED ORDER — FENTANYL CITRATE (PF) 100 MCG/2ML IJ SOLN
INTRAMUSCULAR | Status: DC | PRN
  Administered 2017-12-10 (×3): 25 ug via INTRAVENOUS

## 2017-12-10 MED ORDER — VERAPAMIL HCL 2.5 MG/ML IV SOLN
INTRAVENOUS | Status: AC
  Filled 2017-12-10: qty 2

## 2017-12-10 MED ORDER — HEPARIN (PORCINE) IN NACL 1000-0.9 UT/500ML-% IV SOLN
INTRAVENOUS | Status: DC | PRN
  Administered 2017-12-10 (×3): 500 mL

## 2017-12-10 MED ORDER — HEPARIN SODIUM (PORCINE) 1000 UNIT/ML IJ SOLN
INTRAMUSCULAR | Status: AC
  Filled 2017-12-10: qty 1

## 2017-12-10 MED ORDER — DIAZEPAM 5 MG PO TABS: 5.0000 mg | ORAL_TABLET | Freq: Four times a day (QID) | ORAL | Status: DC | PRN

## 2017-12-10 MED ORDER — CLOPIDOGREL BISULFATE 300 MG PO TABS
ORAL_TABLET | ORAL | Status: DC | PRN
  Administered 2017-12-10: 75 mg via ORAL

## 2017-12-10 MED ORDER — MIDAZOLAM HCL 2 MG/2ML IJ SOLN
INTRAMUSCULAR | Status: DC | PRN
  Administered 2017-12-10: 1 mg via INTRAVENOUS
  Administered 2017-12-10: 2 mg via INTRAVENOUS

## 2017-12-10 MED ORDER — SODIUM CHLORIDE 0.9% FLUSH: 3.0000 mL | INTRAVENOUS | Status: DC | PRN

## 2017-12-10 MED ORDER — LIDOCAINE HCL (PF) 1 % IJ SOLN
INTRAMUSCULAR | Status: AC
  Filled 2017-12-10: qty 30

## 2017-12-10 SURGICAL SUPPLY — 17 items
BALLN SAPPHIRE ~~LOC~~ 4.0X8 (BALLOONS) ×1 IMPLANT
BALLN WOLVERINE 2.50X6 (BALLOONS) ×2
BALLOON WOLVERINE 2.50X6 (BALLOONS) IMPLANT
CATH LAUNCHER 5F EBU4.0 (CATHETERS) ×1 IMPLANT
CATH LAUNCHER 6FR EBU3.5 (CATHETERS) ×1 IMPLANT
CATH VISTA GUIDE 6FR XBLAD3.5 (CATHETERS) ×1 IMPLANT
DEVICE RAD COMP TR BAND LRG (VASCULAR PRODUCTS) ×1 IMPLANT
GLIDESHEATH SLEND A-KIT 6F 22G (SHEATH) ×1 IMPLANT
GUIDEWIRE INQWIRE 1.5J.035X260 (WIRE) IMPLANT
INQWIRE 1.5J .035X260CM (WIRE) ×2
KIT ENCORE 26 ADVANTAGE (KITS) ×1 IMPLANT
KIT HEART LEFT (KITS) ×2 IMPLANT
PACK CARDIAC CATHETERIZATION (CUSTOM PROCEDURE TRAY) ×2 IMPLANT
STENT RESOLUTE ONYX 4.0X12 (Permanent Stent) ×1 IMPLANT
TRANSDUCER W/STOPCOCK (MISCELLANEOUS) ×2 IMPLANT
TUBING CIL FLEX 10 FLL-RA (TUBING) ×2 IMPLANT
WIRE PT2 MS 185 (WIRE) ×1 IMPLANT

## 2017-12-10 NOTE — Interval H&P Note (Signed)
Cath Lab Visit (complete for each Cath Lab visit)  Clinical Evaluation Leading to the Procedure:   ACS: No.  Non-ACS:    Anginal Classification: CCS III  Anti-ischemic medical therapy: Maximal Therapy (2 or more classes of medications)  Non-Invasive Test Results: No non-invasive testing performed  Prior CABG: No previous CABG      History and Physical Interval Note:  12/10/2017 4:14 PM  [name]  has presented today for surgery, with the diagnosis of NSTEMI  The various methods of treatment have been discussed with the patient and family. After consideration of risks, benefits and other options for treatment, the patient has consented to  Procedure(s): CORONARY STENT INTERVENTION (N/A) as a surgical intervention .  The patient's history has been reviewed, patient examined, no change in status, stable for surgery.  I have reviewed the patient's chart and labs.  Questions were answered to the patient's satisfaction.     Shelva Majestic

## 2017-12-10 NOTE — Care Management Important Message (Signed)
Important Message  Patient Details  Name: [name] MRN: [id number] Date of Birth: [birth year]   Medicare Important Message Given:  Yes    [name] P [name] 12/10/2017, 4:35 PM

## 2017-12-10 NOTE — Progress Notes (Signed)
[name] wife bathed him. On tele standby; Continued to monitor him. Wife was Non-compliant when trying to help and place leads back on.

## 2017-12-10 NOTE — Progress Notes (Signed)
Progress Note 12/08/2017, 10:10 AM    Patient Name: [name] Date of Encounter: 12/08/2017  Primary Cardiologist: Andre Champagne, MD  EP: Dr. Caryl Ewing (follows Implantable Loop Recorder)  Subjective   Feeling better today.  His only complaint is that he has some fullness in his right ear making it hard to hear.  A little bit better now than when he saw Andre Deforest, PA an hour ago. No further chest pain or dyspnea.  His blood pressures are great and he feels very happy about this.  Inpatient Medications    Scheduled Meds: . amLODipine  10 mg Oral Daily  . aspirin EC  81 mg Oral Daily  . atorvastatin  80 mg Oral q1800  . carvedilol  25 mg Oral BID WC  . cloNIDine  0.3 mg Transdermal Q Fri  . clopidogrel  75 mg Oral Daily  . furosemide  40 mg Oral Daily  . hydrALAZINE  100 mg Oral TID  . minoxidil  2.5 mg Oral Daily   Continuous Infusions: . sodium chloride    . sodium chloride    . heparin 1,350 Units/hr (12/08/17 0800)  . nitroGLYCERIN 25 mcg/min (12/08/17 0800)   PRN Meds: sodium chloride, acetaminophen, hydrALAZINE, labetalol, morphine injection, nitroGLYCERIN, ondansetron (ZOFRAN) IV   Vital Signs    Vitals:   12/08/17 0822 12/08/17 0826 12/08/17 0830 12/08/17 1000  BP: (!) 181/110 (!) 158/106 (!) 152/98 (!) 170/104  Pulse: 72 71 74 78  Resp: 12 19 12 18   Temp:      TempSrc:      SpO2: 98% 100% 99% 98%  Weight:      Height:        Intake/Output Summary (Last 24 hours) at 12/08/2017 1010 Last data filed at 12/08/2017 0800 Gross per 24 hour  Intake 776.2 ml  Output 720 ml  Net 56.2 ml   Filed Weights   12/07/17 0500 12/07/17 1005 12/08/17 0300  Weight: 67.2 kg 67.2 kg 65.2 kg    Telemetry    NSR - STABLE a- Personally Reviewed  ECG    No new EKG  Physical Exam   Physical Exam  Constitutional: He is oriented to person, place, and time. He appears well-developed and well-nourished. No distress.  Healthy-appearing.  HENT:  Head: Normocephalic and  atraumatic.  Notes somewhat decreased hearing in the right ear, improved with clear nasal passage using ear popping technique  Eyes: Pupils are equal, round, and reactive to light. EOM are normal.  Neck: No hepatojugular reflux and no JVD present. Carotid bruit is not present.  Cardiovascular: Normal rate, regular rhythm and intact distal pulses.  No extrasystoles are present. PMI is not displaced. Exam reveals gallop and S4.  Murmur heard.  Harsh crescendo-decrescendo midsystolic murmur is present with a grade of 3/6 at the upper right sternal border radiating to the neck. Normal S1 with crisp S2  Pulmonary/Chest: Breath sounds normal. No respiratory distress. He has no wheezes. He has no rales.  Abdominal: Soft. Bowel sounds are normal. He exhibits no distension. There is no tenderness. There is no rebound.  Musculoskeletal: Normal range of motion. He exhibits no edema.  Neurological: He is alert and oriented to person, place, and time. No cranial nerve deficit.  Psychiatric: He has a normal mood and affect. His behavior is normal. Judgment and thought content normal.  Happy mood  Vitals reviewed.  Labs    Chemistry Recent Labs  Lab 12/02/17 1457  12/06/17 0422 12/07/17 7412 12/08/17 8786  NA 139   < > 139 137 135  K 4.0   < > 3.6 4.0 3.9  CL 108   < > 107 104 101  CO2 24   < > 22 25 23   GLUCOSE 105*   < > 81 101* 99  BUN 18   < > 18 18 24*  CREATININE 2.07*   < > 1.84* 1.94* 2.20*  CALCIUM 8.9   < > 8.9 9.2 9.3  PROT 6.6  --   --   --   --   ALBUMIN 3.4*  --   --   --   --   AST 18  --   --   --   --   ALT 10  --   --   --   --   ALKPHOS 83  --   --   --   --   BILITOT 0.8  --   --   --   --   GFRNONAA 37*   < > 43* 40* 34*  GFRAA 43*   < > 49* 46* 40*  ANIONGAP 7   < > 10 8 11    < > = values in this interval not displayed.     Hematology Recent Labs  Lab 12/05/17 0321 12/06/17 0404 12/07/17 0358  WBC 4.3 3.6* 5.2  RBC 3.90* 4.16* 4.21*  HGB 12.5* 13.0 13.2    HCT 37.6* 41.4 40.6  MCV 96.4 99.5 96.4  MCH 32.1 31.3 31.4  MCHC 33.2 31.4 32.5  RDW 14.0 14.4 14.1  PLT 126* 119* 139*    Cardiac Enzymes Recent Labs  Lab 12/02/17 1757 12/03/17 0653 12/03/17 1214  TROPONINI 2.14* 2.39* 1.77*    Recent Labs  Lab 12/02/17 1505  TROPIPOC 1.69*     BNPNo results for input(s): BNP, PROBNP in the last 168 hours.   DDimer No results for input(s): DDIMER in the last 168 hours.   Radiology    No results found.  Cardiac Studies    2D Echo 12/03/2017: Severe LVH -speckled appearance consider infiltrative myopathy.  EF 35 and 40% (reduced from previous).  Diffuse hypokinesis.  GR 1 DD.  High filling pressures.  Bioprosthetic aortic valve present.  Trivial regurgitation.  Mild MR.  Moderate LA dilation.  Mild RA dilation. ______________________   LHC 12/06/17 : 95%pRI.  30-50%m LAD.  40%pCx  Staged PCI pending stabilization of renal function  Patient Profile     [name] a 47 y.o. [name] past medical history of aortic dissection s/p Bentall procedure w/ tissue AVR at [facility], CAD w/ LHC in 2016 showing 60% ostial D1, severe hypertension, h/o left real artery stenting,  prior substance abuse, hyperlipidemia, h/o cryptogenic stroke (has implantable loop recorder) and chronic chest pain admitted for evaluation/managment of non-ST elevation myocardial infarction.  W/u this admission showed reduced LVEF, 35-40%, and single vessel obstructive CAD involving the RI. Plan is for staged PCI. Additional hospital problems include HTN and renal insuffiencey.   Assessment & Plan    Principal Problem:   NSTEMI (non-ST elevated myocardial infarction) ([place]) Active Problems:   Hypertensive cardiomyopathy, without heart failure ([place]): EF reduced to 35-40% from 55%.   Malignant hypertension   Coronary artery disease involving native coronary artery of native heart with angina pectoris (HCC)   Hyperlipidemia LDL goal <70   Tobacco use   Aortic  valve disorder   Acute renal failure superimposed on stage 3 chronic kidney disease (HCC)   Aneurysm of ascending  aorta (HCC)   Aortic dissection (HCC)   H/O aortic valve replacement   HLD (hyperlipidemia)  Principal Problem:   NSTEMI (non-ST elevated myocardial infarction) ([place]) W/Coronary artery disease involving native coronary artery of native heart with angina pectoris ([place]) --95% proximal RI noted on cath  Plan is for staged PCI of the ramus intermedius: Since the renal function seems to have plateaued and stabilized, we will proceed with staged PCI today.  Gentle hydration precath.  Continue IV nitroglycerin and IV heparin until cath.-->  Convert from IV nitroglycerin to 60 mg Imdur nightly post-cath  Continue aspirin and Plavix.    Malignant hypertension /Hypertensive cardiomyopathy, without heart failure ([place]): EF reduced to 35-40% from 55%. --Most likely hypertensive cardiomyopathy, however echo revealed severe LVH and LV w/ speckled appearance. ? Infiltrative cardiomyopathy. ? Amyloid. May need a cardiac MRI but can be done outpatient --Complicated by acute on chronic combined systolic and diastolic heart failure on 12/07/2017  BP finally stabilized now on multiple medications. -->  As per above convert from nitroglycerin drip to 60 mg Imdur nightly post-cath.  May want to consider ARB and even possibly Entresto in the outpatient setting once his renal function is stabilized.  Continue low-dose standing oral Lasix.  Did not have renal artery stenosis on initial evaluation.      Hyperlipidemia LDL goal <70  --on 80 mg atorvastatin   Tobacco use cessation counseling.  Has not had a cigarette craving in the hospital -we will discuss possible use of patch in the outpatient setting.     Acute renal failure superimposed on stage 3 chronic kidney disease ([place]):  Baseline SCr ~1.6-1.8. He is followed by Dr. [name], nephrologist. --ARF most likely related to hypertension and possible  mild contrast-induced nephropathy (CIN).  Creatinine seems to have plateaued at this point.  I think her far enough out from the diagnostic catheterization -- this is likely peak effect   Creatinine did improve with gentle hydration --> will start gentle hydration precath/PCI today and gentle hydration at low-dose 50 mL/hour post cath.  Follow for signs of volume overload.    Aortic dissection (HCC) / Aneurysm of ascending aorta (HCC) -partial endovascular and partial open repair.  Large aortic stent graft noted on cath     Aortic valve disorder -  H/O aortic valve replacement --.  Appears okay on echo  H/o Cryptogenic Stroke: s/p implantable loop recorder, followed by Dr. Caryl Ewing. Per records, no detection of afib.   Nitroglycerin related to headache.  Will add low-dose Norco.   Signed, Glenetta Hew, MD   Glenetta Hew, M.D., M.S. Interventional Cardiologist   Pager # [phone] Phone # [phone] [address] [place], [place] [address]    For questions or updates, please contact [place] Please consult [email or web address] for contact info under

## 2017-12-10 NOTE — H&P (View-Only) (Signed)
Progress Note 12/08/2017, 10:10 AM    Patient Name: [name] Date of Encounter: 12/08/2017  Primary Cardiologist: [name], MD  EP: Dr. Caryl Comes (follows Implantable Loop Recorder)  Subjective   Feeling better today.  His only complaint is that he has some fullness in his right ear making it hard to hear.  A little bit better now than when he saw Almyra Deforest, PA an hour ago. No further chest pain or dyspnea.  His blood pressures are great and he feels very happy about this.  Inpatient Medications    Scheduled Meds: . amLODipine  10 mg Oral Daily  . aspirin EC  81 mg Oral Daily  . atorvastatin  80 mg Oral q1800  . carvedilol  25 mg Oral BID WC  . cloNIDine  0.3 mg Transdermal Q Fri  . clopidogrel  75 mg Oral Daily  . furosemide  40 mg Oral Daily  . hydrALAZINE  100 mg Oral TID  . minoxidil  2.5 mg Oral Daily   Continuous Infusions: . sodium chloride    . sodium chloride    . heparin 1,350 Units/hr (12/08/17 0800)  . nitroGLYCERIN 25 mcg/min (12/08/17 0800)   PRN Meds: sodium chloride, acetaminophen, hydrALAZINE, labetalol, morphine injection, nitroGLYCERIN, ondansetron (ZOFRAN) IV   Vital Signs    Vitals:   12/08/17 0822 12/08/17 0826 12/08/17 0830 12/08/17 1000  BP: (!) 181/110 (!) 158/106 (!) 152/98 (!) 170/104  Pulse: 72 71 74 78  Resp: 12 19 12 18   Temp:      TempSrc:      SpO2: 98% 100% 99% 98%  Weight:      Height:        Intake/Output Summary (Last 24 hours) at 12/08/2017 1010 Last data filed at 12/08/2017 0800 Gross per 24 hour  Intake 776.2 ml  Output 720 ml  Net 56.2 ml   Filed Weights   12/07/17 0500 12/07/17 1005 12/08/17 0300  Weight: 67.2 kg 67.2 kg 65.2 kg    Telemetry    NSR - STABLE a- Personally Reviewed  ECG    No new EKG  Physical Exam   Physical Exam  Constitutional: He is oriented to person, place, and time. He appears well-developed and well-nourished. No distress.  Healthy-appearing.  HENT:  Head: Normocephalic and  atraumatic.  Notes somewhat decreased hearing in the right ear, improved with clear nasal passage using ear popping technique  Eyes: Pupils are equal, round, and reactive to light. EOM are normal.  Neck: No hepatojugular reflux and no JVD present. Carotid bruit is not present.  Cardiovascular: Normal rate, regular rhythm and intact distal pulses.  No extrasystoles are present. PMI is not displaced. Exam reveals gallop and S4.  Murmur heard.  Harsh crescendo-decrescendo midsystolic murmur is present with a grade of 3/6 at the upper right sternal border radiating to the neck. Normal S1 with crisp S2  Pulmonary/Chest: Breath sounds normal. No respiratory distress. He has no wheezes. He has no rales.  Abdominal: Soft. Bowel sounds are normal. He exhibits no distension. There is no tenderness. There is no rebound.  Musculoskeletal: Normal range of motion. He exhibits no edema.  Neurological: He is alert and oriented to person, place, and time. No cranial nerve deficit.  Psychiatric: He has a normal mood and affect. His behavior is normal. Judgment and thought content normal.  Happy mood  Vitals reviewed.  Labs    Chemistry Recent Labs  Lab 12/02/17 1457  12/06/17 0422 12/07/17 2536 12/08/17 6440  NA 139   < > 139 137 135  K 4.0   < > 3.6 4.0 3.9  CL 108   < > 107 104 101  CO2 24   < > 22 25 23   GLUCOSE 105*   < > 81 101* 99  BUN 18   < > 18 18 24*  CREATININE 2.07*   < > 1.84* 1.94* 2.20*  CALCIUM 8.9   < > 8.9 9.2 9.3  PROT 6.6  --   --   --   --   ALBUMIN 3.4*  --   --   --   --   AST 18  --   --   --   --   ALT 10  --   --   --   --   ALKPHOS 83  --   --   --   --   BILITOT 0.8  --   --   --   --   GFRNONAA 37*   < > 43* 40* 34*  GFRAA 43*   < > 49* 46* 40*  ANIONGAP 7   < > 10 8 11    < > = values in this interval not displayed.     Hematology Recent Labs  Lab 12/05/17 0321 12/06/17 0404 12/07/17 0358  WBC 4.3 3.6* 5.2  RBC 3.90* 4.16* 4.21*  HGB 12.5* 13.0 13.2    HCT 37.6* 41.4 40.6  MCV 96.4 99.5 96.4  MCH 32.1 31.3 31.4  MCHC 33.2 31.4 32.5  RDW 14.0 14.4 14.1  PLT 126* 119* 139*    Cardiac Enzymes Recent Labs  Lab 12/02/17 1757 12/03/17 0653 12/03/17 1214  TROPONINI 2.14* 2.39* 1.77*    Recent Labs  Lab 12/02/17 1505  TROPIPOC 1.69*     BNPNo results for input(s): BNP, PROBNP in the last 168 hours.   DDimer No results for input(s): DDIMER in the last 168 hours.   Radiology    No results found.  Cardiac Studies    2D Echo 12/03/2017: Severe LVH -speckled appearance consider infiltrative myopathy.  EF 35 and 40% (reduced from previous).  Diffuse hypokinesis.  GR 1 DD.  High filling pressures.  Bioprosthetic aortic valve present.  Trivial regurgitation.  Mild MR.  Moderate LA dilation.  Mild RA dilation. ______________________   LHC 12/06/17 : 95%pRI.  30-50%m LAD.  40%pCx  Staged PCI pending stabilization of renal function  Patient Profile     [name] a 47 y.o. [name] past medical history of aortic dissection s/p Bentall procedure w/ tissue AVR at [facility], CAD w/ LHC in 2016 showing 60% ostial D1, severe hypertension, h/o left real artery stenting,  prior substance abuse, hyperlipidemia, h/o cryptogenic stroke (has implantable loop recorder) and chronic chest pain admitted for evaluation/managment of non-ST elevation myocardial infarction.  W/u this admission showed reduced LVEF, 35-40%, and single vessel obstructive CAD involving the RI. Plan is for staged PCI. Additional hospital problems include HTN and renal insuffiencey.   Assessment & Plan    Principal Problem:   NSTEMI (non-ST elevated myocardial infarction) ([place]) Active Problems:   Hypertensive cardiomyopathy, without heart failure ([place]): EF reduced to 35-40% from 55%.   Malignant hypertension   Coronary artery disease involving native coronary artery of native heart with angina pectoris (HCC)   Hyperlipidemia LDL goal <70   Tobacco use   Aortic  valve disorder   Acute renal failure superimposed on stage 3 chronic kidney disease (HCC)   Aneurysm of ascending  aorta (HCC)   Aortic dissection (HCC)   H/O aortic valve replacement   HLD (hyperlipidemia)  Principal Problem:   NSTEMI (non-ST elevated myocardial infarction) ([place]) W/Coronary artery disease involving native coronary artery of native heart with angina pectoris ([place]) --95% proximal RI noted on cath  Plan is for staged PCI of the ramus intermedius: Since the renal function seems to have plateaued and stabilized, we will proceed with staged PCI today.  Gentle hydration precath.  Continue IV nitroglycerin and IV heparin until cath.-->  Convert from IV nitroglycerin to 60 mg Imdur nightly post-cath  Continue aspirin and Plavix.    Malignant hypertension /Hypertensive cardiomyopathy, without heart failure ([place]): EF reduced to 35-40% from 55%. --Most likely hypertensive cardiomyopathy, however echo revealed severe LVH and LV w/ speckled appearance. ? Infiltrative cardiomyopathy. ? Amyloid. May need a cardiac MRI but can be done outpatient --Complicated by acute on chronic combined systolic and diastolic heart failure on 12/07/2017  BP finally stabilized now on multiple medications. -->  As per above convert from nitroglycerin drip to 60 mg Imdur nightly post-cath.  May want to consider ARB and even possibly Entresto in the outpatient setting once his renal function is stabilized.  Continue low-dose standing oral Lasix.  Did not have renal artery stenosis on initial evaluation.      Hyperlipidemia LDL goal <70  --on 80 mg atorvastatin   Tobacco use cessation counseling.  Has not had a cigarette craving in the hospital -we will discuss possible use of patch in the outpatient setting.     Acute renal failure superimposed on stage 3 chronic kidney disease ([place]):  Baseline SCr ~1.6-1.8. He is followed by Dr. [name], nephrologist. --ARF most likely related to hypertension and possible  mild contrast-induced nephropathy (CIN).  Creatinine seems to have plateaued at this point.  I think her far enough out from the diagnostic catheterization -- this is likely peak effect   Creatinine did improve with gentle hydration --> will start gentle hydration precath/PCI today and gentle hydration at low-dose 50 mL/hour post cath.  Follow for signs of volume overload.    Aortic dissection (HCC) / Aneurysm of ascending aorta (HCC) -partial endovascular and partial open repair.  Large aortic stent graft noted on cath     Aortic valve disorder -  H/O aortic valve replacement --.  Appears okay on echo  H/o Cryptogenic Stroke: s/p implantable loop recorder, followed by Dr. Caryl Comes. Per records, no detection of afib.   Nitroglycerin related to headache.  Will add low-dose Norco.   Signed, Glenetta Hew, MD   Glenetta Hew, M.D., M.S. Interventional Cardiologist   Pager # [phone] Phone # [phone] [address]. [place], [place] [address]    For questions or updates, please contact [place] Please consult [email or web address] for contact info under

## 2017-12-10 NOTE — Progress Notes (Signed)
ANTICOAGULATION CONSULT NOTE - Follow Up Consult  Pharmacy Consult for heparin Indication: chest pain/ACS  Labs: Recent Labs    12/08/17 0325 12/09/17 0307 12/10/17 0304 12/10/17 0402  HGB  --  13.5 12.5*  --   HCT  --  41.8 38.6*  --   PLT  --  142* 148*  --   HEPARINUNFRC 0.54 0.63 0.58  --   CREATININE 2.20* 2.14*  --  2.23*    Assessment: 47 yo M with PMH of aortic dissection repair (bioprosthetic valve 04/2014, no PTA anticoagulation) admitted for chest pain. Pharmacy has been consulted for heparin dosing for NSTEMI. Cath cancelled 9/24 d/t sob in lab and to go to cath today -Heparin level remains at goal today.  Goal of Therapy:  Heparin level 0.3-0.7 units/ml Monitor platelets by anticoagulation protocol: Yes   Plan:  Continue IV heparin at 1250 units/hr Will follow plans post cath  Hildred Laser, PharmD Clinical Pharmacist Please check Amion for pharmacy contact number

## 2017-12-10 NOTE — Progress Notes (Signed)
Troponin 0.11

## 2017-12-11 ENCOUNTER — Other Ambulatory Visit: Payer: Self-pay | Admitting: Physician Assistant

## 2017-12-11 DIAGNOSIS — N183 Chronic kidney disease, stage 3 unspecified: Secondary | ICD-10-CM

## 2017-12-11 LAB — BASIC METABOLIC PANEL
Anion gap: 10 (ref 5–15)
BUN: 34 mg/dL — AB (ref 6–20)
CHLORIDE: 98 mmol/L (ref 98–111)
CO2: 24 mmol/L (ref 22–32)
CREATININE: 2.31 mg/dL — AB (ref 0.61–1.24)
Calcium: 9.4 mg/dL (ref 8.9–10.3)
GFR calc Af Amer: 38 mL/min — ABNORMAL LOW (ref >60)
GFR calc non Af Amer: 32 mL/min — ABNORMAL LOW (ref >60)
GLUCOSE: 95 mg/dL (ref 70–99)
Potassium: 4 mmol/L (ref 3.5–5.1)
SODIUM: 132 mmol/L — AB (ref 135–145)

## 2017-12-11 LAB — CBC
HCT: 37.9 % — ABNORMAL LOW (ref 39.0–52.0)
Hemoglobin: 12.3 g/dL — ABNORMAL LOW (ref 13.0–17.0)
MCH: 31.2 pg (ref 26.0–34.0)
MCHC: 32.5 g/dL (ref 30.0–36.0)
MCV: 96.2 fL (ref 78.0–100.0)
Platelets: 144 10*3/uL — ABNORMAL LOW (ref 150–400)
RBC: 3.94 MIL/uL — ABNORMAL LOW (ref 4.22–5.81)
RDW: 13.4 % (ref 11.5–15.5)
WBC: 4 10*3/uL (ref 4.0–10.5)

## 2017-12-11 MED ORDER — NICOTINE 14 MG/24HR TD PT24: 14.0000 mg | MEDICATED_PATCH | Freq: Every day | TRANSDERMAL | 0 refills | Status: DC

## 2017-12-11 MED ORDER — ATORVASTATIN CALCIUM 80 MG PO TABS: 80.0000 mg | ORAL_TABLET | Freq: Every day | ORAL | 3 refills | Status: DC

## 2017-12-11 MED ORDER — ISOSORBIDE MONONITRATE ER 60 MG PO TB24: 60.0000 mg | ORAL_TABLET | Freq: Every day | ORAL | 3 refills | Status: DC

## 2017-12-11 MED ORDER — FUROSEMIDE 40 MG PO TABS: ORAL_TABLET | ORAL | 1 refills | Status: DC

## 2017-12-11 MED ORDER — ANGIOPLASTY BOOK
Freq: Once | Status: DC
  Filled 2017-12-11: qty 1

## 2017-12-11 MED ORDER — ASPIRIN 81 MG PO TBEC: 81.0000 mg | DELAYED_RELEASE_TABLET | Freq: Every day | ORAL | 3 refills | Status: AC

## 2017-12-11 MED ORDER — CLOPIDOGREL BISULFATE 75 MG PO TABS: 75.0000 mg | ORAL_TABLET | Freq: Every day | ORAL | 3 refills | Status: DC

## 2017-12-11 MED ORDER — NICOTINE 14 MG/24HR TD PT24
14.0000 mg | MEDICATED_PATCH | Freq: Every day | TRANSDERMAL | Status: DC
  Administered 2017-12-11: 14 mg via TRANSDERMAL
  Filled 2017-12-11: qty 1

## 2017-12-11 MED ORDER — CARVEDILOL 25 MG PO TABS: 25.0000 mg | ORAL_TABLET | Freq: Two times a day (BID) | ORAL | 3 refills | Status: DC

## 2017-12-11 MED ORDER — HYDRALAZINE HCL 100 MG PO TABS: 100.0000 mg | ORAL_TABLET | Freq: Three times a day (TID) | ORAL | 3 refills | Status: DC

## 2017-12-11 NOTE — Discharge Summary (Addendum)
The patient has been seen in conjunction with Fabian Sharp, PA-C. All aspects of care have been considered and discussed. The patient has been personally interviewed, examined, and all clinical data has been reviewed.   Angina has been resolved with ramus intermedius PCI.  Kidney function today is stable compared to pre-PCI creatinine.  Patient has no clinical evidence of heart failure.  Renin angiotensin blockade has been temporarily held until kidney function stabilizes.  Right radial access site does not reveal evidence of hematoma.  Lungs and cardiac exam are consistent with his known Bentall prosthetic device.  Plan discharge today.  Hold diuretics.  Basic metabolic panel on Monday morning to follow kidney function.  Clinical follow-up within 7 days of discharge.  Mandatory to call if dyspnea or chest discomfort.  Given furosemide prescription to use if dyspnea develops.  He would need to call and receive proper instruction should he develop dyspnea.   Discharge Summary    Patient ID: [name] MRN: [id number]; DOB: 1970-03-22  Admit date: 12/02/2017 Discharge date: 12/11/2017  Primary Care Provider: Clent Demark, PA-C  Primary Cardiologist: [name], MD  Primary Electrophysiologist:  None   Discharge Diagnoses    Principal Problem:   NSTEMI (non-ST elevated myocardial infarction) [facility]) Active Problems:   Hyperlipidemia LDL goal <70   Tobacco use   Aortic valve disorder   Hypertensive cardiomyopathy, without heart failure ([place]): EF reduced to 35-40% from 55%.   Acute renal failure superimposed on stage 3 chronic kidney disease (HCC)   Malignant hypertension   Aneurysm of ascending aorta (HCC)   Aortic dissection (HCC)   H/O aortic valve replacement   HLD (hyperlipidemia)   Coronary artery disease involving native coronary artery of native heart with angina pectoris (HCC)   Acute on chronic combined systolic and diastolic CHF (congestive heart failure)  (HCC)   Allergies Allergies  Allergen Reactions  . Imdur [Isosorbide Nitrate] Other (See Comments)    Headaches   . Tramadol Other (See Comments)    Headaches     Diagnostic Studies/Procedures    Coronary angiography 12/06/17:  95 % proximal ramus intermedius.  Normal, widely patent LM  Mid 30-50% LAD  40% proximal circumflex within a bend.  Proximal RCA within a bend.  No LV gram performed to decrease contrast exposure.  Recommendation   Hydrate. Follow kidney function.  Staged PCI on ramus in 1- 3 days depending on kidney function.  Plavix load.  Recommend dual antiplatelet therapy with Aspirin 81mg  daily long-term (beyond 12 months) because of plvix.   Coronary stent intervention 12/10/17:  Prox LAD to Mid LAD lesion is 30% stenosed.  Prox Cx lesion is 50% stenosed.  Ost Ramus lesion is 90% stenosed.  Post intervention, there is a 0% residual stenosis.  A stent was successfully placed.   Very difficult but successful PCI to a 90% napkin ring near ostial stenosis in the  ramus intermediate vessel treated  with Cutting Balloon and ultimate stenting with a 4.0 x 12 mm Resolute DES stent postdilated to 4.14 mm with the stenosis being reduced to 0%.  RECOMMENDATION: Patient will be hydrated post procedure.  In several hours he will be given a dose of intravenous furosemide to reduce potential for volume overload.  He had baseline kidney insufficiency with creatinine of 2.23.  Renal function will need to be monitored very closely.  High potency statin therapy.  Medical therapy for concomitant CAD and heart failure with reduced ejection fraction.  Smoking cessation is  imperative.  Recommend uninterrupted dual antiplatelet therapy with Aspirin 81mg  daily and Clopidogrel 75mg  daily for a minimum of 12 months (ACS - Class I recommendation).     History of Present Illness     Patient is status post aortic dissection in February 2016.  Left heart  catheterization April 2016 demonstrated a 60% first diagonal.  He was transferred to [facility] and underwent aortic valve replacement with a bovine pericardial tissue valve and coronary reconstruction.  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  He has had intermittent chest pain since that time by his report.  He presents with 48 hours of left-sided chest pain radiating to substernal area.  He describes the pain as a sharp pain and gets worse with lying on his right side.  It also increases with inspiration.  It is similar to his previous pain but did not completely resolved.  He therefore presented to the emergency room.  Symptoms improved with pain medications.  Troponin elevated and cardiology asked to evaluate.  Last cardiogram December 2017 showed ejection fraction 55 to 60%, prior aortic valve replacement, prior ascending aortic graft repair, mild mitral regurgitation, mild to moderate left atrial enlargement.  He presented with atypical chest pain with elevated troponin.   Hospital Course     Consultants: none  NSTEMI, CAD with DES to ramus intermedius He was admitted to [facility] for NSTEMI with plans for heart catheterization. Given his CKD, he was first hydrated. Heart cath revealed 90% in the ramus intermedius. Decision was made to hydrate further for staged intervention. Returned to lab on 12/07/17 but was dyspneic. Suspected acute on chronic diastolic and diastolic heart failure dur to hydration and CKD. Procedure was aborted/postponed. He was diuresed and returned to the cath lab on 12/10/17 with successful DES to RI. He is discharged on ASA and plavix.    HTN On amlodipine, coreg, clonidine patch, imdur, and hydralazine. Home labetatalol and lisinopril held for renal function.   HLD Continue statin. 12/08/2017: Cholesterol 150; HDL 47; LDL Cholesterol 89; Triglycerides 72; VLDL 14   Tobacco abuse Continue to encourage cessation.  Prescribed nicotine patch.   CKD stage III sCr 2.31. Baseline appears to be near 1.6. Creatinine today appears stable from prior to heart cath.  May need outpatient nephrology follow up. Will check BMP in office on Monday.   Pt seen and examined by Dr. Tamala Julian and deemed ready for discharge. Follow up has been made.  _____________  Discharge Vitals Blood pressure (!) 141/79, pulse 73, temperature 98.2 F (36.8 C), resp. rate 15, height 6' (1.829 m), weight 66.6 kg, SpO2 100 %.  Filed Weights   12/07/17 1005 12/08/17 0300 12/09/17 0349  Weight: 67.2 kg 65.2 kg 66.6 kg    Labs & Radiologic Studies    CBC Recent Labs    12/10/17 0304 12/11/17 0241  WBC 4.9 4.0  HGB 12.5* 12.3*  HCT 38.6* 37.9*  MCV 97.0 96.2  PLT 148* 161*   Basic Metabolic Panel Recent Labs    12/10/17 0402 12/11/17 0241  NA 136 132*  K 3.9 4.0  CL 101 98  CO2 25 24  GLUCOSE 93 95  BUN 30* 34*  CREATININE 2.23* 2.31*  CALCIUM 9.5 9.4   Liver Function Tests No results for input(s): AST, ALT, ALKPHOS, BILITOT, PROT, ALBUMIN in the last 72 hours. No results for input(s): LIPASE, AMYLASE in the last 72 hours. Cardiac Enzymes Recent Labs    12/10/17 1850  TROPONINI 0.11*   BNP Invalid input(s): POCBNP D-Dimer No results for input(s): DDIMER in the last 72 hours. Hemoglobin A1C No results for input(s): HGBA1C in the last 72 hours. Fasting Lipid Panel No results for input(s): CHOL, HDL, LDLCALC, TRIG, CHOLHDL, LDLDIRECT in the last 72 hours. Thyroid Function Tests No results for input(s): TSH, T4TOTAL, T3FREE, THYROIDAB in the last 72 hours.  Invalid input(s): FREET3 _____________  Ct Head Wo Contrast  Result Date: 12/02/2017 CLINICAL DATA:  Chest pain for 2 days with nausea and headache. EXAM: CT HEAD WITHOUT CONTRAST TECHNIQUE: Contiguous axial images were obtained from the base of the skull through the vertex without intravenous contrast. COMPARISON:  Brain MRI 03/03/2016 FINDINGS: Brain:  There is a chronic moderate-sized left MCA infarct in the parietal lobe, acute on the prior MRI. There is no evidence of acute infarct, intracranial hemorrhage, mass, midline shift, or extra-axial fluid collection. Patchy cerebral white matter hypodensities are most conspicuous in the subcortical frontal white matter, nonspecific but compatible with chronic small vessel ischemic disease. The ventricles are normal. Vascular: No hyperdense vessel. Vertebrobasilar dolichoectasia. Skull: No fracture or focal osseous lesion. Sinuses/Orbits: Moderate bilateral ethmoid, left frontal, and left sphenoid sinus mucosal thickening. Clear mastoid air cells. Unremarkable included orbits. Other: None. IMPRESSION: 1. No evidence of acute intracranial abnormality. 2. Chronic left MCA infarct. Electronically Signed   By: Logan Bores M.D.   On: 12/02/2017 16:41   Dg Chest Port 1 View  Result Date: 12/02/2017 CLINICAL DATA:  Constant chest pressure for 2 days. Shortness of breath. EXAM: PORTABLE CHEST 1 VIEW COMPARISON:  Chest radiograph September 30, 2017 FINDINGS: Stable cardiomegaly. Aortic stent graft. Status post median sternotomy, cardiac valve replacement. Loop monitor projects LEFT chest. No pleural effusion or focal consolidation. LEFT lung base scarring with pleural thickening. Surgical clips project RIGHT axilla. Osseous structures are normal. IMPRESSION: Stable cardiomegaly. No acute pulmonary process. Stable LEFT lung base pleural thickening and scarring. Electronically Signed   By: Elon Alas M.D.   On: 12/02/2017 16:06   Ct Angio Chest/abd/pel For Dissection W And/or W/wo  Result Date: 12/02/2017 CLINICAL DATA:  Chest pain radiating into left chest with nausea and headache. Prior Bentall procedure with arch replacement and debranching followed by TEVAR to treat chronic type B aortic dissection. Status post more recent thoracoabdominal aortic repair and placement of multi branch graft to the celiac, SMA and  bilateral renal arteries in October, 2018. All of the vascular procedures were performed at [facility]. EXAM: CT ANGIOGRAPHY CHEST, ABDOMEN AND PELVIS TECHNIQUE: Multidetector CT imaging through the chest, abdomen and pelvis was performed using the standard protocol during bolus administration of intravenous contrast. Multiplanar reconstructed images and MIPs were obtained and reviewed to evaluate the vascular anatomy. CONTRAST:  142mL ISOVUE-370 IOPAMIDOL (ISOVUE-370) INJECTION 76% COMPARISON:  10/01/2017 as well as multiple prior studies. FINDINGS: CTA CHEST FINDINGS Cardiovascular: Prosthetic aortic valve shows stable appearance. The aortic root demonstrates stable diameter of approximately 4.3 cm. Ascending aortic graft and thoracic endograft shows stable appearance and patency. On the arterial phase of imaging, no endoleak or dissection is identified. No evidence of intramural aortic hemorrhage. Elephant graft supplying the great vessels shows normal and stable patency with normal patency noted of the visualized proximal great vessels. Stable heart size with evidence of stable left ventricular hypertrophy. No pericardial fluid identified. The pulmonary arteries are not adequately opacified to assess for pulmonary embolism. Central pulmonary arteries are normal in caliber. Mediastinum/Nodes: No enlarged mediastinal, hilar, or axillary lymph nodes.  Thyroid gland, trachea, and esophagus demonstrate no significant findings. Lungs/Pleura: Lungs show stable mild scarring at the left base. There is no evidence of pulmonary edema, consolidation, pneumothorax, nodule or pleural fluid. Musculoskeletal: No chest wall abnormality. No acute or significant osseous findings. Review of the MIP images confirms the above findings. CTA ABDOMEN AND PELVIS FINDINGS VASCULAR Aorta: Stable appearance of the proximal abdominal aorta status post graft placement with stable grafts to the celiac axis, superior mesenteric artery and  bilateral renal arteries. All of the visceral arteries are normally patent. The aneurysmal portion of the native aorta previously representing a patent false lumen after aortic dissection remains successfully excluded and thrombosed. Aortic diameter at this level is approximately 4.6 cm including the thrombosed excluded aorta. The distal aorta is normally patent. No evidence of acute dissection. Celiac: Normally patent. SMA: Normally patent. Renals: Normally patent. IMA: Normally patent. 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 Internal and external iliac arteries show stable and normal patency. The common femoral arteries and femoral bifurcations are normally patent. Review of the MIP images confirms the above findings. NON-VASCULAR Hepatobiliary: No focal liver abnormality is seen. No gallstones, gallbladder wall thickening, or biliary dilatation. Pancreas: Unremarkable. No pancreatic ductal dilatation or surrounding inflammatory changes. Spleen: Normal in size without focal abnormality. Adrenals/Urinary Tract: Adrenal glands are unremarkable. Kidneys are normal, without renal calculi, focal lesion, or hydronephrosis. Bladder is unremarkable. Stomach/Bowel: No bowel abnormalities identified on the arterial phase study without oral contrast. No free intraperitoneal air identified. Lymphatic: No enlarged lymph nodes identified. Reproductive: Prostate is unremarkable. Other: No abdominal wall hernia or abnormality. No abdominopelvic ascites. Musculoskeletal: No acute or significant osseous findings. Review of the MIP images confirms the above findings. IMPRESSION: Stable postoperative appearance of the thoracic and abdominal aorta after prior extensive procedures including aortic valve replacement, aortic root replacement, debranching of the great vessels with elephant graft, TEVAR and thoracoabdominal aortic repair with grafting  to the celiac axis, SMA and bilateral renal arteries. No evidence of acute dissection, hemorrhage or other acute findings demonstrated. Electronically Signed   By: Aletta Edouard M.D.   On: 12/02/2017 17:15   Disposition   Pt is being discharged home today in good condition.  Follow-up Plans & Appointments    Follow-up Information    Clent Demark, PA-C .   Specialty:  Physician Assistant Contact information: [place] [address] [phone]        Almyra Def[place], [place] Follow up on 12/23/2017.   Specialties:  Cardiology, Radiology Why:  2:30PM. Cardiology visit Contact information: [address] [place] [address] [phone]        [place] Follow up on 12/13/2017.   Specialty:  Cardiology Why:  BMP Contact information: [address] [place] [phone]         Discharge Instructions    Amb Referral to Cardiac Rehabilitation   Complete by:  As directed    Diagnosis:   Coronary Stents NSTEMI     Diet - low sodium heart healthy   Complete by:  As directed    Discharge instructions   Complete by:  As directed    No driving for 2 days. No lifting over 5 lbs for 1 week. No sexual activity for 1 week. You may return to work in 1 week. Keep procedure site clean & dry.  If you notice increased pain, swelling, bleeding or pus, call/return!  You may shower, but no soaking baths/hot tubs/pools for 1 week.   Increase activity slowly   Complete by:  As directed       Discharge Medications   Allergies as of 12/11/2017      Reactions   Imdur [isosorbide Nitrate] Other (See Comments)   Headaches   Tramadol Other (See Comments)   Headaches      Medication List    STOP taking these medications   labetalol 300 MG tablet Commonly known as:  NORMODYNE   lisinopril 30 MG tablet Commonly known as:  PRINIVIL,ZESTRIL     TAKE these medications   acetaminophen 325 MG  tablet Commonly known as:  TYLENOL Take 650 mg by mouth every 6 (six) hours as needed (for pain or headaches).   amLODipine 10 MG tablet Commonly known as:  NORVASC Take 10 mg by mouth daily. Notes to patient:  For blood pressure   aspirin 81 MG EC tablet Take 1 tablet (81 mg total) by mouth daily. Start taking on:  12/12/2017   atorvastatin 80 MG tablet Commonly known as:  LIPITOR Take 1 tablet (80 mg total) by mouth daily. What changed:    medication strength  how much to take Notes to patient:  For cholesterol   carvedilol 25 MG tablet Commonly known as:  COREG Take 1 tablet (25 mg total) by mouth 2 (two) times daily with a meal. Notes to patient:  For blood pressure and heart pump stonger   cloNIDine 0.3 mg/24hr patch Commonly known as:  CATAPRES - Dosed in mg/24 hr Place 1 patch (0.3 mg total) onto the skin once a week. What changed:  when to take this   clopidogrel 75 MG tablet Commonly known as:  PLAVIX Take 1 tablet (75 mg total) by mouth daily with breakfast. Start taking on:  12/12/2017 Notes to patient:  Because you have a stent   furosemide 40 MG tablet Commonly known as:  LASIX Take one tablet daily as needed for shortness of breath or weight gain of more than 3 lbs in one day. Notes to patient:  As needed for any shortness of breath   gabapentin 300 MG capsule Commonly known as:  NEURONTIN Take 2 capsules (600 mg total) by mouth 3 (three) times daily.   hydrALAZINE 100 MG tablet Commonly known as:  APRESOLINE Take 1 tablet (100 mg total) by mouth 3 (three) times daily. Notes to patient:  For blood pressure   isosorbide mononitrate 60 MG 24 hr tablet Commonly known as:  IMDUR Take 1 tablet (60 mg total) by mouth daily. Start taking on:  12/12/2017 Notes to patient:  Long acting nitro pill   nicotine 14 mg/24hr patch Commonly known as:  NICODERM CQ - dosed in mg/24 hours Place 1 patch (14 mg total) onto the skin daily.        Acute coronary  syndrome (MI, NSTEMI, STEMI, etc) this admission?: Yes.     AHA/ACC Clinical Performance & Quality Measures: 7. Aspirin prescribed? - Yes 8. ADP Receptor Inhibitor (Plavix/Clopidogrel, Brilinta/Ticagrelor or Effient/Prasugrel) prescribed (includes medically managed patients)? - Yes 9. Beta Blocker prescribed? - Yes 10. High Intensity Statin (Lipitor 40-80mg  or Crestor 20-40mg ) prescribed? - Yes 11. EF assessed during THIS hospitalization? - Yes 12. For EF <40%, was ACEI/ARB prescribed? - No - Reason:  ckd 13. For EF <40%, Aldosterone Antagonist (Spironolactone or Eplerenone) prescribed? - No - Reason:  ckd 14. Cardiac Rehab  Phase II ordered (Included Medically managed Patients)? - Yes     Outstanding Labs/Studies   BMP on Monday  Duration of Discharge Encounter   Greater than 30 minutes including physician time.  Signed, Tami Lin Duke, PA 12/11/2017, 9:51 AM

## 2017-12-11 NOTE — Progress Notes (Signed)
CARDIAC REHAB PHASE I   PRE:  Rate/Rhythm: 84 SR  BP:  Sitting: 166/64      SaO2: 97 RA  MODE:  Ambulation: 600 ft   POST:  Rate/Rhythm: 96 SR  BP:  Sitting: 156/83    SaO2: 98 RA   Pt ambulated 627ft in hallway independently with steady gait. Pt denies CP or SOB, does c/o some dizziness. Pt and family member educated on importance of ASA, Plavix, statin, and NTG. Pt given MI book. Reviewed restrictions and exercise guidelines. Talked to pt at length about need for smoking cessation, pt agreeable, tip sheet given. Will refer to CRP II [place] Rufina Falco, RN BSN 12/11/2017 9:21 AM

## 2017-12-11 NOTE — Progress Notes (Addendum)
CARDIOLOGY PROGRESS NOTE   The patient underwent complex PCI yesterday related to tortuosity in aorta and innominate artery.  He has had no angina since the procedure was completed.  Denies orthopnea and PND.  Because of renal insufficiency renin angiotensin blockade (lisinopril) has been discontinued and long-acting nitrates/hydralazine started.  Also note the patient was not on diuretic therapy upon admission.  Blood pressure 140/70 mmHg.  Systolic murmur heard related to his prosthetic valve.  Creatinine stable today compared to pre-procedure at 2.3.  Unknown amount of contrast used yesterday but urine output has been great.  Digital images reviewed.  Nice result on ramus intermedius.  Impression: Status post PCI of ramus intermedius with success.  Stable kidney function now 24 hours out from PCI.  Creatinine is higher now than on admission related to contrast exposure x2 during this hospital stay.  ACE inhibitor therapy has been discontinued.  Will hold diuretic therapy and use only if the patient develops dyspnea.  He was not on diuretic prior to admission.  PLAN: Discharged today on amlodipine, hydralazine, Imdur, clonidine, minoxidil, and carvedilol.  Will need a prescription for furosemide 40 mg to be taken once if he develops shortness of breath.  PRN nitroglycerin, high intensity statin therapy, and we will prescribe a nicotine patch.  He needs a basic metabolic panel on Monday morning to the attention of Dr. Daneen Schick or Ellouise Newer had a copy needs to be sent to Dr. [name] his primary physician.  Senna Prill has been discontinued at least for the time being until kidney function completely recovers.

## 2017-12-13 ENCOUNTER — Encounter (HOSPITAL_COMMUNITY): Payer: Self-pay | Admitting: Cardiovascular Disease

## 2017-12-13 ENCOUNTER — Telehealth (HOSPITAL_COMMUNITY): Payer: Self-pay

## 2017-12-13 ENCOUNTER — Other Ambulatory Visit: Payer: Medicare Other | Admitting: *Deleted

## 2017-12-13 ENCOUNTER — Telehealth (INDEPENDENT_AMBULATORY_CARE_PROVIDER_SITE_OTHER): Payer: Self-pay | Admitting: Physician Assistant

## 2017-12-13 ENCOUNTER — Other Ambulatory Visit: Payer: Self-pay

## 2017-12-13 DIAGNOSIS — N183 Chronic kidney disease, stage 3 unspecified: Secondary | ICD-10-CM

## 2017-12-13 DIAGNOSIS — I25119 Atherosclerotic heart disease of native coronary artery with unspecified angina pectoris: Secondary | ICD-10-CM

## 2017-12-13 LAB — CUP PACEART REMOTE DEVICE CHECK
Implantable Pulse Generator Implant Date: 20180406
MDC IDC SESS DTM: 20190920043526

## 2017-12-13 LAB — BASIC METABOLIC PANEL
BUN / CREAT RATIO: 15 (ref 9–20)
BUN: 40 mg/dL — ABNORMAL HIGH (ref 6–24)
CO2: 21 mmol/L (ref 20–29)
CREATININE: 2.62 mg/dL — AB (ref 0.76–1.27)
Calcium: 9.5 mg/dL (ref 8.7–10.2)
Chloride: 102 mmol/L (ref 96–106)
GFR calc Af Amer: 33 mL/min/{1.73_m2} — ABNORMAL LOW (ref >59)
GFR, EST NON AFRICAN AMERICAN: 28 mL/min/{1.73_m2} — AB (ref >59)
GLUCOSE: 69 mg/dL (ref 65–99)
POTASSIUM: 4.5 mmol/L (ref 3.5–5.2)
SODIUM: 137 mmol/L (ref 134–144)

## 2017-12-13 MED FILL — Heparin Sodium (Porcine) Inj 1000 Unit/ML: INTRAMUSCULAR | Qty: 30 | Status: AC

## 2017-12-13 NOTE — Telephone Encounter (Signed)
Attempted to make initial call to patient in regards to Cardiac Rehab - lm on vm

## 2017-12-13 NOTE — Telephone Encounter (Signed)
Patient wife called to request a medication refill for   amLODipine (NORVASC) 10 MG tablet   Patient uses [facility] [phone] - [place], [place] [address]  Patient was in the hospital for 9 days and was advice to continue taking amlodipine but should get RX thru PCP.  Please Advice [phone]  Thank you Emmit Pomfret

## 2017-12-13 NOTE — Telephone Encounter (Signed)
Patients insurance is active and benefits verified through Medicare A/B - No co-pay, deductible amount of $185.00/$185.00 has been met, no out of pocket, 20% co-insurance, and no pre-authorization is required. Passport/reference [phone]  Patients insurance is active through [facility] -Reference [id number] Faxed over Medicaid Reimbursement form to [place].  Will contact patient in regards to Cardiac Rehab. If interested, patient will need to complete follow up appt. Once completed, patient will be contacted for scheduling.

## 2017-12-14 ENCOUNTER — Telehealth: Payer: Self-pay | Admitting: Cardiology

## 2017-12-14 ENCOUNTER — Other Ambulatory Visit (INDEPENDENT_AMBULATORY_CARE_PROVIDER_SITE_OTHER): Payer: Self-pay | Admitting: Physician Assistant

## 2017-12-14 MED ORDER — AMLODIPINE BESYLATE 10 MG PO TABS: 10.0000 mg | ORAL_TABLET | Freq: Every day | ORAL | 5 refills | Status: DC

## 2017-12-14 NOTE — Telephone Encounter (Signed)
Refill sent.

## 2017-12-14 NOTE — Telephone Encounter (Signed)
LMOVM requesting that pt send manual transmission b/c home monitor has not updated in at least 14 days.    

## 2017-12-15 ENCOUNTER — Telehealth (HOSPITAL_COMMUNITY): Payer: Self-pay | Admitting: Vascular Surgery

## 2017-12-15 NOTE — Telephone Encounter (Signed)
Left pt message giving new pt apt w/ APP/NP , 10/24 @ 11am, asked pt to call back to confirm appt

## 2017-12-15 NOTE — Telephone Encounter (Signed)
LMOVM requesting manual transmission from [facility] monitor. [place] phone number for questions/concerns.  Monitor last updated on 12/02/17.

## 2017-12-17 ENCOUNTER — Other Ambulatory Visit: Payer: Medicare Other | Admitting: *Deleted

## 2017-12-17 DIAGNOSIS — I25119 Atherosclerotic heart disease of native coronary artery with unspecified angina pectoris: Secondary | ICD-10-CM | POA: Diagnosis not present

## 2017-12-18 LAB — BASIC METABOLIC PANEL
BUN / CREAT RATIO: 16 (ref 9–20)
BUN: 35 mg/dL — AB (ref 6–24)
CALCIUM: 9.6 mg/dL (ref 8.7–10.2)
CHLORIDE: 103 mmol/L (ref 96–106)
CO2: 21 mmol/L (ref 20–29)
Creatinine, Ser: 2.18 mg/dL — ABNORMAL HIGH (ref 0.76–1.27)
GFR, EST AFRICAN AMERICAN: 41 mL/min/{1.73_m2} — AB (ref >59)
GFR, EST NON AFRICAN AMERICAN: 35 mL/min/{1.73_m2} — AB (ref >59)
Glucose: 104 mg/dL — ABNORMAL HIGH (ref 65–99)
Potassium: 4.5 mmol/L (ref 3.5–5.2)
Sodium: 140 mmol/L (ref 134–144)

## 2017-12-21 NOTE — Telephone Encounter (Signed)
LMOVM requesting call back to the [facility]. Gave direct number.

## 2017-12-23 ENCOUNTER — Encounter: Payer: Self-pay | Admitting: Physician Assistant

## 2017-12-23 ENCOUNTER — Ambulatory Visit (INDEPENDENT_AMBULATORY_CARE_PROVIDER_SITE_OTHER): Payer: Medicare Other | Admitting: Physician Assistant

## 2017-12-23 VITALS — BP 160/84 | HR 64 | Ht 72.0 in | Wt 153.6 lb

## 2017-12-23 DIAGNOSIS — Z8673 Personal history of transient ischemic attack (TIA), and cerebral infarction without residual deficits: Secondary | ICD-10-CM

## 2017-12-23 DIAGNOSIS — I639 Cerebral infarction, unspecified: Secondary | ICD-10-CM | POA: Diagnosis not present

## 2017-12-23 DIAGNOSIS — I25119 Atherosclerotic heart disease of native coronary artery with unspecified angina pectoris: Secondary | ICD-10-CM

## 2017-12-23 DIAGNOSIS — N183 Chronic kidney disease, stage 3 unspecified: Secondary | ICD-10-CM

## 2017-12-23 DIAGNOSIS — E785 Hyperlipidemia, unspecified: Secondary | ICD-10-CM

## 2017-12-23 DIAGNOSIS — I10 Essential (primary) hypertension: Secondary | ICD-10-CM

## 2017-12-23 DIAGNOSIS — Z9889 Other specified postprocedural states: Secondary | ICD-10-CM

## 2017-12-23 MED ORDER — ISOSORBIDE MONONITRATE ER 120 MG PO TB24: 120.0000 mg | ORAL_TABLET | Freq: Every day | ORAL | 4 refills | Status: DC

## 2017-12-23 NOTE — Patient Instructions (Addendum)
Medication Instructions:  INCREASE Imdur to 120mg  Take 1 tablet once a day  If you need a refill on your cardiac medications before your next appointment, please call your pharmacy.   Lab work: Your physician recommends that you return for lab work in: TODAY-BMET If you have labs (blood work) drawn today and your tests are completely normal, you will receive your results only by: [name] MyChart Message (if you have MyChart) OR . A paper copy in the mail If you have any lab test that is abnormal or we need to change your treatment, we will call you to review the results.  Testing/Procedures: NONE   Follow-Up: At [facility], you and your health needs are our priority.  As part of our continuing mission to provide you with exceptional heart care, we have created designated Provider Care Teams.  These Care Teams include your primary Cardiologist (physician) and Advanced Practice Providers (APPs -  Physician Assistants and Nurse Practitioners) who all work together to provide you with the care you need, when you need it. Your physician recommends that you schedule a follow-up appointment in: 3 MONTHS with DR [facility]   Any Other Special Instructions Will Be Listed Below (If Applicable). START CARDIAC REHAB

## 2017-12-23 NOTE — Progress Notes (Signed)
Cardiology Office Note    Date:  12/23/2017   ID:  [name], DOB February 19, 1971, MRN [id number]  PCP:  Clent Demark, PA-C  Cardiologist: Dr. Aundra Dubin Primary electrophysiologist: Dr. Caryl Comes Nephrologist: [name]  Chief Complaint  Patient presents with  . Follow-up    seen for Dr. Aundra Dubin    History of Present Illness:  [name] is a 47 y.o. male with PMH of prior substance abuse, HLD, HTN, aortic dissection repair, h/o cryptogenic stroke s/p loop recorder, chronic chest pain who recently was admitted for NSTEMI.  He was found to have aortic dissection in February 2016.  Cardiac catheterization in April 2016 demonstrated 60% first diagonal lesion.  He underwent aortic valve replacement with bovine pericardial tissue valve and reconstruction along with aortic root replacement and endovascular repair of the descending thoracic aortic aneurysm at [facility].  He has had intermittent chest discomfort since then.  Last echocardiogram obtained in December 2017 showed EF 55 to 60%, prior aortic valve replacement, prior a sending aortic graft repair, mild MR, mild to moderate LAE.  He recently presented to the hospital on 12/02/2017 with 48-hour onset of chest pain.  He was ruled in for NSTEMI, troponin peaked at 2.39 before trending back down.  Repeat echocardiogram obtained on 12/03/2017 showed EF 35 to 40%, grade 1 DD, bioprosthetic aortic valve present, mild MR.  The left ventricle did have speckled appearance, consider infiltrative cardiomyopathy.  He eventually underwent cardiac catheterization on 12/06/2017 which showed a 95% proximal ramus intermedius lesion, 30 to 50% mid LAD lesion, widely patent RCA.  Staged PCI was planned at a later time due to renal function.  Stage PCI we will see him eventually performed on 12/10/2017 which showed a 30% proximal to mid LAD lesion, 50% proximal left circumflex lesion, 90% ostial ramus lesion treated with 4.0 x 12 mm resolute DES.  Patient was placed on aspirin  and Plavix and was recommended he continue dual antiplatelet therapy for at least one month.  Patient presents today for cardiology office visit.  He denies any further chest pain.  He did have 2 episodes of right arm pain.  Right wrist cath site appears to be stable.  He does describe some dyspnea on exertion, I recommended for him to start on the cardiac rehab.  EKG continued to show T wave inversion in the lateral leads unchanged from the hospital.  We discussed compliance with dual antiplatelet therapy during today's visit.  Otherwise he appears to be euvolemic.  He took Lasix twice since that his discharge and is currently only on Lasix as needed.  Renal function is improving however he has been instructed to follow-up with his nephrologist Dr. [name] closely.    Past Medical History:  Diagnosis Date  . Adenomatous colon polyp 08/2015  . Anxiety   . Aortic disease ([place])   . Aortic dissection (HCC)    a. admx 04/2014 >> L renal infarct; a/c renal failure >> b.  s/p Bioprosthetic [place] and total arch replacement and staged endovascular repair of descending aortic aneurysm ([facility] - Dr. [name])  . CAD (coronary artery disease)    a. LHC 4/16:  oD1 60%  . Cardiomyopathy ([place])    a. non-ischemic - probably related to untreated HTN and ETOH abuse - Echo 3/13 with EF 35-40% >> b. Echo 4/16: Severe LVH, EF 55-60%, moderate AI, moderate MR, mild LAE, trivial effusion, known type B dissection with communication between true and false lumens with suprasternal images suggesting dissection  plane may propagate to at least left subclavian takeoff, root above aortic valve okay    . Chronic abdominal pain   . Chronic combined systolic and diastolic congestive heart failure ([place])    a. 05/2011: Adm with pulm edema/HTN urgency, EF 35-40% with diffuse hypokinesis and moderate to severe mitral regurgitation. Cardiomyopathy likely due to uncontrolled HTN and ETOH abuse - cath deferred due to renal insufficiency  (felt due to uncontrolled HTN). bJodie Echevaria MV 06/2011: EF 37% and no ischemia or infarction. c. EF 45-50% by echo 01/2012.  [name] Chronic sinusitis   . CKD (chronic kidney disease)    a. 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  peak creatinine 3.46 during admx for aortic dissection 2/16.  sees Dr [name]  . Colon polyps 09/11/2015   Descending, sigmoid polyps  . DDD (degenerative disc disease), lumbar   . Depression   . Descending thoracic aortic aneurysm ([place])   . Dissecting aneurysm of thoracic aorta ([place])   . ETOH abuse    a. Reported to have quit 05/2011.  . Frequent headaches   . GERD (gastroesophageal reflux disease)   . Headache(784.0)    "q other day" (08/08/2013)  . Heart murmur   . Hemorrhoid thrombosis   . History of echocardiogram    Echo 1/17:  Severe LVH, EF 55-60%, no RWMA, Gr 2 DD, AVR ok, mild to mod MR, mild LAE, mild reduced RVSF, mod RAE  . History of medication noncompliance   . HYPERLIPIDEMIA   . Hypertension    a. Hx of HTN urgency secondary to noncompliance. b. urinary metanephrine and catecholeamine levels normal 2013.  c. Renal art [name] 1/16:  No evidence of renal artery stenosis noted bilaterally.  . INGUINAL HERNIA   . Pneumonia ~ 2013  . Renal insufficiency   . Stroke ([place])   . Tobacco abuse   . Valvular heart disease    a. Echo 05/2011: moderate to severe eccentric MR and mild to moderate AI with prolapsing left coronary cusp. b. Echo 01/2012: mild-mod AI, mild dilitation of aortic root, mild MR.;  c. Echo 1/16: Severe LVH consistent with hypertrophic cardio myopathy, EF 50%, no RWMA, mod AI, mild MR, mild RAE, dilated Ao root (40 mm);     Past Surgical History:  Procedure Laterality Date  . ANKLE SURGERY Bilateral    Fractures bilaterally  . AORTIC VALVE SURGERY  09/2014  . CORONARY ANGIOGRAPHY N/A 12/06/2017   Procedure: CORONARY ANGIOGRAPHY;  Surgeon: Belva Crome, MD;  Location: [place] [facility];  Service: Cardiovascular;  Laterality: N/A;  . CORONARY STENT  INTERVENTION N/A 12/10/2017   Procedure: CORONARY STENT INTERVENTION;  Surgeon: [name] Sine, MD;  Location: [place] [facility];  Service: Cardiovascular;  Laterality: N/A;  . FOOT FRACTURE SURGERY Bilateral 2004-2010   "got pins in both of them"  . HEMORRHOID SURGERY N/A 06/15/2015   Procedure: HEMORRHOIDECTOMY;  Surgeon: Stark Klein, MD;  Location: [place];  Service: General;  Laterality: N/A;  . INGUINAL HERNIA REPAIR Right ~ 1996  . LEFT HEART CATHETERIZATION WITH CORONARY ANGIOGRAM N/A 06/21/2014   Procedure: LEFT HEART CATHETERIZATION WITH CORONARY ANGIOGRAM;  Surgeon: Larey Dresser, MD;  Location: [facility] C[facility];  Service: Cardiovascular;  Laterality: N/A;  . LOOP RECORDER INSERTION N/A 06/19/2016   Procedure: Loop Recorder Insertion;  Surgeon: Deboraha Sprang, MD;  Location: [place] [facility];  Service: Cardiovascular;  Laterality: N/A;  . TEE WITHOUT CARDIOVERSION N/A 03/12/2016   Procedure: TRANSESOPHAGEAL ECHOCARDIOGRAM (TEE);  Surgeon:  Sanda Klein, MD;  Location: [facility];  Service: Cardiovascular;  Laterality: N/A;    Current Medications: Outpatient Medications Prior to Visit  Medication Sig Dispense Refill  . acetaminophen (TYLENOL) 325 MG tablet Take 650 mg by mouth every 6 (six) hours as needed (for pain or headaches).     [name] amLODipine (NORVASC) 10 MG tablet Take 1 tablet (10 mg total) by mouth daily. 30 tablet 5  . aspirin EC 81 MG EC tablet Take 1 tablet (81 mg total) by mouth daily. 90 tablet 3  . atorvastatin (LIPITOR) 80 MG tablet Take 1 tablet (80 mg total) by mouth daily. 90 tablet 3  . carvedilol (COREG) 25 MG tablet Take 1 tablet (25 mg total) by mouth 2 (two) times daily with a meal. 180 tablet 3  . cloNIDine (CATAPRES - DOSED IN MG/24 HR) 0.3 mg/24hr patch Place 1 patch (0.3 mg total) onto the skin once a week. (Patient taking differently: Place 0.3 mg onto the skin every Friday. ) 4 patch 6  . clopidogrel (PLAVIX) 75 MG tablet Take 1 tablet (75 mg total) by mouth  daily with breakfast. 90 tablet 3  . furosemide (LASIX) 40 MG tablet Take one tablet daily as needed for shortness of breath or weight gain of more than 3 lbs in one day. 30 tablet 1  . gabapentin (NEURONTIN) 300 MG capsule Take 2 capsules (600 mg total) by mouth 3 (three) times daily. 180 capsule 2  . hydrALAZINE (APRESOLINE) 100 MG tablet Take 1 tablet (100 mg total) by mouth 3 (three) times daily. 270 tablet 3  . nicotine (NICODERM CQ - DOSED IN MG/24 HOURS) 14 mg/24hr patch Place 1 patch (14 mg total) onto the skin daily. 28 patch 0  . isosorbide mononitrate (IMDUR) 60 MG 24 hr tablet Take 1 tablet (60 mg total) by mouth daily. 90 tablet 3   No facility-administered medications prior to visit.      Allergies:   Imdur [isosorbide nitrate] and Tramadol   Social History   Socioeconomic History  . Marital status: Married    Spouse name: Not on file  . Number of children: 5  . Years of education: 33  . Highest education level: Not on file  Occupational History  . Occupation: Disabled, part-time Programmer, systems    Comment: Disability  Social Needs  . Financial resource strain: Not on file  . Food insecurity:    Worry: Not on file    Inability: Not on file  . Transportation needs:    Medical: Not on file    Non-medical: Not on file  Tobacco Use  . Smoking status: Current Some Day Smoker    Packs/day: 0.25    Years: 23.00    Pack years: 5.75    Types: Cigarettes  . Smokeless tobacco: Never Used  . Tobacco comment: 3 to 4 per day  Substance and Sexual Activity  . Alcohol use: Yes    Alcohol/week: 1.0 standard drinks    Types: 1 Cans of beer per week    Comment: occasionally  . Drug use: Yes    Types: Marijuana    Comment: 2 times/week  . Sexual activity: Yes  Lifestyle  . Physical activity:    Days per week: Not on file    Minutes per session: Not on file  . Stress: Not on file  Relationships  . Social connections:    Talks on phone: Not on file    Gets together:  Not on file  Attends religious service: Not on file    Active member of club or organization: Not on file    Attends meetings of clubs or organizations: Not on file    Relationship status: Not on file  Other Topics Concern  . Not on file  Social History Narrative   Fun: Enjoy his children     Family History:  The patient's family history includes Colon cancer in his paternal uncle; Diabetes in his maternal aunt; Headache in his sister; Heart attack in his brother; Hypertension in his mother and unknown relative; Lung cancer in his maternal uncle; Other in his sister; Stroke in his maternal aunt; Thyroid disease in his sister.   ROS:   Please see the history of present illness.    ROS All other systems reviewed and are negative.   PHYSICAL EXAM:   VS:  BP (!) 160/84   Pulse 64   Ht 6' (1.829 m)   Wt 153 lb 9.6 oz (69.7 kg)   BMI 20.83 kg/m    GEN: Well nourished, well developed, in no acute distress  HEENT: normal  Neck: no JVD, carotid bruits, or masses Cardiac: RRR; no murmurs, rubs, or gallops,no edema  Respiratory:  clear to auscultation bilaterally, normal work of breathing GI: soft, nontender, nondistended, + BS MS: no deformity or atrophy  Skin: warm and dry, no rash Neuro:  Alert and Oriented x 3, Strength and sensation are intact Psych: euthymic mood, full affect  Wt Readings from Last 3 Encounters:  12/23/17 153 lb 9.6 oz (69.7 kg)  12/09/17 146 lb 12.8 oz (66.6 kg)  10/05/17 149 lb 6.4 oz (67.8 kg)      Studies/Labs Reviewed:   EKG:  EKG is ordered today.  The ekg ordered today demonstrates normal sinus rhythm, T wave inversion in lateral leads.  Unchanged from hospitalization.  Recent Labs: 06/22/2017: TSH 1.490 12/02/2017: ALT 10 12/11/2017: Hemoglobin 12.3; Platelets 144 12/17/2017: BUN 35; Creatinine, Ser 2.18; Potassium 4.5; Sodium 140   Lipid Panel    Component Value Date/Time   CHOL 150 12/08/2017 0325   TRIG 72 12/08/2017 0325   HDL 47  12/08/2017 0325   CHOLHDL 3.2 12/08/2017 0325   VLDL 14 12/08/2017 0325   LDLCALC 89 12/08/2017 0325    Additional studies/ records that were reviewed today include:   Echo 12/03/2017 LV EF: 35% -   40% Study Conclusions  - Left ventricle: The cavity size was normal. Wall thickness was   increased in a pattern of severe LVH. Systolic function was   moderately reduced. The estimated ejection fraction was in the   range of 35% to 40%. Diffuse hypokinesis. Doppler parameters are   consistent with abnormal left ventricular relaxation (grade 1   diastolic dysfunction). Doppler parameters are consistent with   high ventricular filling pressure. - Aortic valve: A bioprosthesis was present. There was trivial   regurgitation. - Mitral valve: There was mild regurgitation. - Left atrium: The atrium was moderately dilated. - Right atrium: The atrium was mildly dilated.  Impressions:  - Moderate global reduction in LV systolic function; EF 35; severe   LVH; LV with speckled appearance; consider infiltrative   cardiomyopathy; mild diastolic dysfunction; s/p AVR with normal   velocity (2.2 m/s) and trace AI; mildly dilated aortic root; mild   MR; moderate LAE; mild RAE.     Cath 12/06/2017  95 % proximal ramus intermedius.  Normal, widely patent LM  Mid 30-50% LAD  40% proximal circumflex within a bend.  Proximal RCA within a bend.  No LV gram performed to decrease contrast exposure.  Recommendation   Hydrate. Follow kidney function.  Staged PCI on ramus in 1- 3 days depending on kidney function.  Plavix load.   Recommend dual antiplatelet therapy with Aspirin 81mg  daily long-term (beyond 12 months) because of plvix.     Cath 12/10/2017  Prox LAD to Mid LAD lesion is 30% stenosed.  Prox Cx lesion is 50% stenosed.  Ost Ramus lesion is 90% stenosed.  Post intervention, there is a 0% residual stenosis.  A stent was successfully placed.   Very difficult  but successful PCI to a 90% napkin ring near ostial stenosis in the  ramus intermediate vessel treated  with Cutting Balloon and ultimate stenting with a 4.0 x 12 mm Resolute DES stent postdilated to 4.14 mm with the stenosis being reduced to 0%.  RECOMMENDATION: Patient will be hydrated post procedure.  In several hours he will be given a dose of intravenous furosemide to reduce potential for volume overload.  He had baseline kidney insufficiency with creatinine of 2.23.  Renal function will need to be monitored very closely.  High potency statin therapy.  Medical therapy for concomitant CAD and heart failure with reduced ejection fraction.  Smoking cessation is imperative.  Recommend uninterrupted dual antiplatelet therapy with Aspirin 81mg  daily and Clopidogrel 75mg  daily for a minimum of 12 months (ACS - Class I recommendation).   ASSESSMENT:    1. Coronary artery disease involving native coronary artery of native heart with angina pectoris (HCC)   2. Stage 3 chronic kidney disease ([place])   3. Essential hypertension   4. Hyperlipidemia, unspecified hyperlipidemia type   5. S/P aortic dissection repair   6. H/O: CVA (cerebrovascular accident)      PLAN:  In order of problems listed above:  1. CAD: On aspirin and Plavix.  Continue with high-dose statin.  T wave inversion in lateral leads noted on EKG.  Otherwise no significant chest pain since discharge.  Patient did complain of right arm pain after cardiac cath, however he has very good radial artery pulse.  I did not see any obvious vascular injury on physical exam  2. CKD stage III: Need to be followed closely by his nephrologist  3. Hypertension: Blood pressure elevated, increase Imdur to 120 mg daily.  Not on ARB due to significant renal dysfunction.  4. Hyperlipidemia: Continue on Lipitor 80 mg daily.  5. History of aortic dissection repair: Systolic BP goal 106-269S  6. History of CVA: No recurrence.    Medication  Adjustments/Labs and Tests Ordered: Current medicines are reviewed at length with the patient today.  Concerns regarding medicines are outlined above.  Medication changes, Labs and Tests ordered today are listed in the Patient Instructions below. Patient Instructions  Medication Instructions:  INCREASE Imdur to 120mg  Take 1 tablet once a day  If you need a refill on your cardiac medications before your next appointment, please call your pharmacy.   Lab work: Your physician recommends that you return for lab work in: TODAY-BMET If you have labs (blood work) drawn today and your tests are completely normal, you will receive your results only by: [name] MyChart Message (if you have MyChart) OR . A paper copy in the mail If you have any lab test that is abnormal or we need to change your treatment, we will call you to review the results.  Testing/Procedures: NONE   Follow-Up: At [facility], you and your health needs  are our priority.  As part of our continuing mission to provide you with exceptional heart care, we have created designated Provider Care Teams.  These Care Teams include your primary Cardiologist (physician) and Advanced Practice Providers (APPs -  Physician Assistants and Nurse Practitioners) who all work together to provide you with the care you need, when you need it. Your physician recommends that you schedule a follow-up appointment in: 3 MONTHS with DR [facility]   Any Other Special Instructions Will Be Listed Below (If Applicable). START CARDIAC [address]    Weston Brass Almyra Deforest, [place]  12/23/2017 5:10 PM    [facility] [place], [place], [place]  [address] Phone: [phone]; Fax: [phone]

## 2017-12-24 ENCOUNTER — Telehealth (HOSPITAL_COMMUNITY): Payer: Self-pay | Admitting: *Deleted

## 2017-12-24 LAB — BASIC METABOLIC PANEL
BUN/Creatinine Ratio: 16 (ref 9–20)
BUN: 33 mg/dL — ABNORMAL HIGH (ref 6–24)
CALCIUM: 9.5 mg/dL (ref 8.7–10.2)
CHLORIDE: 107 mmol/L — AB (ref 96–106)
CO2: 24 mmol/L (ref 20–29)
Creatinine, Ser: 2.11 mg/dL — ABNORMAL HIGH (ref 0.76–1.27)
GFR calc Af Amer: 42 mL/min/{1.73_m2} — ABNORMAL LOW (ref >59)
GFR calc non Af Amer: 37 mL/min/{1.73_m2} — ABNORMAL LOW (ref >59)
Glucose: 73 mg/dL (ref 65–99)
POTASSIUM: 4.7 mmol/L (ref 3.5–5.2)
SODIUM: 143 mmol/L (ref 134–144)

## 2017-12-24 NOTE — Telephone Encounter (Signed)
Clinical review of pt for cardiac rehab after follow up appt on 12/23/17 cardiologist office note by Almyra Deforest, PA. Per note, [name] SBP is not within goal of 110-120s. [name] BP was 160/84 per OV note.  Medication changes made.  Next f/u 01/06/18 with HF Clinic.  Will continue to follow along with BP management. [name]

## 2017-12-30 ENCOUNTER — Encounter (HOSPITAL_COMMUNITY): Payer: Self-pay | Admitting: *Deleted

## 2017-12-31 ENCOUNTER — Encounter: Payer: Self-pay | Admitting: Cardiology

## 2018-01-05 ENCOUNTER — Encounter: Payer: Medicare Other | Admitting: *Deleted

## 2018-01-06 ENCOUNTER — Ambulatory Visit (HOSPITAL_COMMUNITY)
Admission: RE | Admit: 2018-01-06 | Discharge: 2018-01-06 | Disposition: A | Discharge status: Home / Self Care | Payer: Medicare Other | Source: Ambulatory Visit | Attending: Internal Medicine | Admitting: Internal Medicine

## 2018-01-06 ENCOUNTER — Encounter (HOSPITAL_COMMUNITY): Payer: Self-pay

## 2018-01-06 ENCOUNTER — Telehealth: Payer: Self-pay | Admitting: Internal Medicine

## 2018-01-06 VITALS — BP 154/74 | HR 88 | Wt 158.4 lb

## 2018-01-06 DIAGNOSIS — E785 Hyperlipidemia, unspecified: Secondary | ICD-10-CM | POA: Insufficient documentation

## 2018-01-06 DIAGNOSIS — I429 Cardiomyopathy, unspecified: Secondary | ICD-10-CM | POA: Diagnosis not present

## 2018-01-06 DIAGNOSIS — Z79899 Other long term (current) drug therapy: Secondary | ICD-10-CM | POA: Insufficient documentation

## 2018-01-06 DIAGNOSIS — K219 Gastro-esophageal reflux disease without esophagitis: Secondary | ICD-10-CM | POA: Insufficient documentation

## 2018-01-06 DIAGNOSIS — I252 Old myocardial infarction: Secondary | ICD-10-CM | POA: Diagnosis not present

## 2018-01-06 DIAGNOSIS — Z955 Presence of coronary angioplasty implant and graft: Secondary | ICD-10-CM | POA: Diagnosis not present

## 2018-01-06 DIAGNOSIS — I5043 Acute on chronic combined systolic (congestive) and diastolic (congestive) heart failure: Secondary | ICD-10-CM

## 2018-01-06 DIAGNOSIS — Z888 Allergy status to other drugs, medicaments and biological substances status: Secondary | ICD-10-CM | POA: Insufficient documentation

## 2018-01-06 DIAGNOSIS — I130 Hypertensive heart and chronic kidney disease with heart failure and stage 1 through stage 4 chronic kidney disease, or unspecified chronic kidney disease: Secondary | ICD-10-CM | POA: Diagnosis not present

## 2018-01-06 DIAGNOSIS — N183 Chronic kidney disease, stage 3 unspecified: Secondary | ICD-10-CM

## 2018-01-06 DIAGNOSIS — E859 Amyloidosis, unspecified: Secondary | ICD-10-CM | POA: Diagnosis present

## 2018-01-06 DIAGNOSIS — Z8349 Family history of other endocrine, nutritional and metabolic diseases: Secondary | ICD-10-CM | POA: Diagnosis not present

## 2018-01-06 DIAGNOSIS — F17210 Nicotine dependence, cigarettes, uncomplicated: Secondary | ICD-10-CM | POA: Insufficient documentation

## 2018-01-06 DIAGNOSIS — Z9889 Other specified postprocedural states: Secondary | ICD-10-CM

## 2018-01-06 DIAGNOSIS — Z95828 Presence of other vascular implants and grafts: Secondary | ICD-10-CM | POA: Insufficient documentation

## 2018-01-06 DIAGNOSIS — I10 Essential (primary) hypertension: Secondary | ICD-10-CM | POA: Diagnosis not present

## 2018-01-06 DIAGNOSIS — Z7902 Long term (current) use of antithrombotics/antiplatelets: Secondary | ICD-10-CM | POA: Insufficient documentation

## 2018-01-06 DIAGNOSIS — Z7982 Long term (current) use of aspirin: Secondary | ICD-10-CM | POA: Insufficient documentation

## 2018-01-06 DIAGNOSIS — I5022 Chronic systolic (congestive) heart failure: Secondary | ICD-10-CM | POA: Insufficient documentation

## 2018-01-06 DIAGNOSIS — Z8249 Family history of ischemic heart disease and other diseases of the circulatory system: Secondary | ICD-10-CM | POA: Insufficient documentation

## 2018-01-06 DIAGNOSIS — I25119 Atherosclerotic heart disease of native coronary artery with unspecified angina pectoris: Secondary | ICD-10-CM

## 2018-01-06 DIAGNOSIS — Z8673 Personal history of transient ischemic attack (TIA), and cerebral infarction without residual deficits: Secondary | ICD-10-CM | POA: Diagnosis not present

## 2018-01-06 DIAGNOSIS — Z953 Presence of xenogenic heart valve: Secondary | ICD-10-CM | POA: Diagnosis not present

## 2018-01-06 DIAGNOSIS — I2510 Atherosclerotic heart disease of native coronary artery without angina pectoris: Secondary | ICD-10-CM | POA: Insufficient documentation

## 2018-01-06 DIAGNOSIS — Z8781 Personal history of (healed) traumatic fracture: Secondary | ICD-10-CM | POA: Diagnosis not present

## 2018-01-06 LAB — BASIC METABOLIC PANEL
Anion gap: 7 (ref 5–15)
BUN: 19 mg/dL (ref 6–20)
CHLORIDE: 111 mmol/L (ref 98–111)
CO2: 23 mmol/L (ref 22–32)
CREATININE: 1.78 mg/dL — AB (ref 0.61–1.24)
Calcium: 9.3 mg/dL (ref 8.9–10.3)
GFR calc Af Amer: 51 mL/min — ABNORMAL LOW (ref >60)
GFR calc non Af Amer: 44 mL/min — ABNORMAL LOW (ref >60)
GLUCOSE: 89 mg/dL (ref 70–99)
Potassium: 4.1 mmol/L (ref 3.5–5.1)
Sodium: 141 mmol/L (ref 135–145)

## 2018-01-06 MED ORDER — NICOTINE 7 MG/24HR TD PT24: 7.0000 mg | MEDICATED_PATCH | TRANSDERMAL | 0 refills | Status: DC

## 2018-01-06 MED ORDER — NICOTINE 14 MG/24HR TD PT24: 14.0000 mg | MEDICATED_PATCH | TRANSDERMAL | 0 refills | Status: DC

## 2018-01-06 NOTE — Patient Instructions (Signed)
Today you have been seen at the Heart failure clinic at [facility]   Medication changes: Nicoderm Patch 14mg  for 2 weeks then 7mg  for 4 weeks  You had Lab work done today  We will call you if your lab work is abnormal.. No news is good news!!   You had an EKG DONE TODAY   Follow up with dr.McLean  You have been referred to [place] [facility] for a PYP Scan  Do the following things EVERYDAY: 1) Weigh yourself in the morning before breakfast. Write it down and keep it in a log. 2) Take your medicines as prescribed 3) Eat low salt foods-Limit salt (sodium) to 2000 mg per day.  4) Stay as active as you can everyday 5) Limit all fluids for the day to less than 2 liters

## 2018-01-06 NOTE — Telephone Encounter (Signed)
   1. Has your device fired?no  2. Is you device beeping? no  3. Are you experiencing draining or swelling at device site? no  4. Are you calling to see if we received your device transmission? no  5. Have you passed out? No  Patient called to notify that is device is not working so he can't transmit. He is going to contact the company    Please route to [employer]

## 2018-01-06 NOTE — Progress Notes (Signed)
Pt completed SDOH screening during clinic appt- no concerns identified at this time  CSW will continue to follow in clinic and assist as needed  Jorge Ny, [place] Worker [facility] [phone]

## 2018-01-06 NOTE — Progress Notes (Signed)
ADVANCED HF CLINIC CONSULT NOTE  Referring Physician: Almyra Deforest PA-C Primary Care: Domenica Fail PA-C  Primary Cardiologist: Dr Aundra Dubin  Nephrology: Dr [name] [facility]: Dr [name]  CT Surgeon  HPI: [name] is a 47 year old with a history of CAD, NSTEMI 11/2017,  smoker, CKD Stage III, HTN, AAA 2016 at [facility] , open repair of TAAA secondary to chronic dissection with multibranch dacron  graft 12/2016, CVA 2017, loop recorder, bioprothetic AVR 2016, HLD.   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  The descending thoracic aorta has dilated to 5.1 cm.  Cardiac catheterization in April 2016 demonstrated 60% first diagonal lesion.  He underwent aortic valve replacement with bovine pericardial tissue valve and reconstruction along with aortic root replacement and endovascular repair of the descending thoracic aortic aneurysm at [facility].  He has had intermittent chest discomfort since then.  Admitted in 12/02/2017 chest pain. Had NSTEMI. He had LHC with stent placed proximal ramus. He underwent staged PCI of of ramus intermedius. Continue asa 81 mg daily + plavix.    Today he presents for HF follow up. Overall feeling fine. Denies SOB/PND/Orthopnea. occasionally SOB with steps. Appetite ok. No fever or chills. Weight at home has been stable. Taking all medications. Rarely drinks alcohol. Smokes 4 cigarettes a day. Want to try nicoderm patches.   Rarely drinks alcohol. Smokes 4 cigarettes/day.   Labs (10/19): K 4.1, creatinine 1.78  ECG: NSR, LVH with repolarization abnormality (personally reviewed).   Review of Systems: All systems reviewed and negative except as per HPI.   PMH: 1. H/o ankle fractures bilaterally 2. GERD 3. HTN: Negative urinary catecholeamine collection for pheochromocytoma.  4. CKD: Stage 3. Suspect hypertensive nephropathy.  5.  Cardiomyopathy: Most likely due to heavy ETOH ingestion and uncontrolled HTN.   Echo (3/13) with EF 35-40%, diffuse HK, mild LVH, moderate to severe eccentric Andre, mild to moderate AI with left coronary cusp prolapse, PA systolic pressure 38 mmHg.  Lexiscan myoview (4/13): EF 37%, global hypokinesis, no ischemic or infarction.  Echo (12/14) with EF 45-50%, diffuse hypokinesis, moderate LVH, mild Andre, moderate AI.  Echo (4/16) with EF 55-60%, severe LVH, mild LV dilation, moderate AI and moderate Andre.   - Echo (9/19): EF 40-45% with diffuse hypokinesis, severe LVH, bioprosthetic aortic valve functioning normally.  6. ETOH abuse: Quit 3/13.  7. Active smoker. 8. Valvular heart disease: Echo in 3/13 showed moderate to severe eccentric Andre and mild to moderate AI with prolapsing left coronary cusp. Echo 12/14 with mild Andre and moderate AI.  Echo 4/16 with moderate AI and moderate Andre.  - S/p Bentall procedure with bioprosthetic aortic valve 9/16.  9.  CVA: Has ILR. 10. Type B aortic dissection: Occurred in 2/16, from just distal to the left subclavian.  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    - 10/03/2014 First stage median sternotomy with right axillary cannulation for aortic valved conduit root replacement with coronary reconstruction (button Bentall Procedure), 27 mm Sorin Mitroflow bovine pericardial valved conduit, total arch replacement (73mm Vascutek [place] multibranch arch graft with individual head vessel reimplantation. - Status post open repair of an Extent III TAAA secondary to chronic dissection with a # 70mm Vascutek "Coselli" multibranch Dacron graft with individual celiac, SMA, and bilateral renal artery implantationon 01/04/2017, [facility].  11. CAD: LHC (4/16) with 60% ostial  D1.   - NSTEMI 9/19: LHC (9/19) showed 50% proximal LCx, 90% ramus => DES to ramus.    Current Outpatient Medications  Medication Sig Dispense Refill  . acetaminophen (TYLENOL) 325 MG tablet Take  650 mg by mouth every 6 (six) hours as needed (for pain or headaches).     [name] amLODipine (NORVASC) 10 MG tablet Take 1 tablet (10 mg total) by mouth daily. 30 tablet 5  . aspirin EC 81 MG EC tablet Take 1 tablet (81 mg total) by mouth daily. 90 tablet 3  . atorvastatin (LIPITOR) 80 MG tablet Take 1 tablet (80 mg total) by mouth daily. 90 tablet 3  . carvedilol (COREG) 25 MG tablet Take 1 tablet (25 mg total) by mouth 2 (two) times daily with a meal. 180 tablet 3  . cloNIDine (CATAPRES - DOSED IN MG/24 HR) 0.3 mg/24hr patch Place 1 patch (0.3 mg total) onto the skin once a week. (Patient taking differently: Place 0.3 mg onto the skin every Friday. ) 4 patch 6  . clopidogrel (PLAVIX) 75 MG tablet Take 1 tablet (75 mg total) by mouth daily with breakfast. 90 tablet 3  . furosemide (LASIX) 40 MG tablet Take one tablet daily as needed for shortness of breath or weight gain of more than 3 lbs in one day. 30 tablet 1  . isosorbide mononitrate (IMDUR) 120 MG 24 hr tablet Take 1 tablet (120 mg total) by mouth daily. 30 tablet 4  . gabapentin (NEURONTIN) 300 MG capsule Take 2 capsules (600 mg total) by mouth 3 (three) times daily. 180 capsule 2   No current facility-administered medications for this encounter.     Allergies  Allergen Reactions  . Imdur [Isosorbide Nitrate] Other (See Comments)    Headaches   . Tramadol Other (See Comments)    Headaches       Social History   Socioeconomic History  . Marital status: Married    Spouse name: Not on file  . Number of children: 5  . Years of education: 96  . Highest education level: Not on file  Occupational History  . Occupation: Disabled, part-time Programmer, systems    Comment: Disability  Social Needs  . Financial resource strain: Not on file  . Food insecurity:    Worry: Not on file    Inability: Not on file  . Transportation needs:    Medical: Not on file    Non-medical: Not on file  Tobacco Use  . Smoking status: Current Some Day  Smoker    Packs/day: 0.25    Years: 23.00    Pack years: 5.75    Types: Cigarettes  . Smokeless tobacco: Never Used  . Tobacco comment: 3 to 4 per day  Substance and Sexual Activity  . Alcohol use: Yes    Alcohol/week: 1.0 standard drinks    Types: 1 Cans of beer per week    Comment: occasionally  . Drug use: Yes    Types: Marijuana    Comment: 2 times/week  . Sexual activity: Yes  Lifestyle  . Physical activity:    Days per week: Not on file    Minutes per session: Not on file  . Stress: Not on file  Relationships  . Social connections:    Talks on phone: Not on file    Gets together: Not on file    Attends religious service: Not on file    Active member of club or organization: Not on file    Attends meetings  of clubs or organizations: Not on file    Relationship status: Not on file  . Intimate partner violence:    Fear of current or ex partner: Not on file    Emotionally abused: Not on file    Physically abused: Not on file    Forced sexual activity: Not on file  Other Topics Concern  . Not on file  Social History Narrative   Fun: Enjoy his children      Family History  Problem Relation Age of Onset  . Hypertension Mother   . Hypertension Unknown   . Colon cancer Paternal Uncle   . Stroke Maternal Aunt   . Heart attack Brother   . Diabetes Maternal Aunt   . Lung cancer Maternal Uncle   . Other Sister        "breathing machine at night"  . Headache Sister   . Thyroid disease Sister   . Throat cancer Neg Hx   . Pancreatic cancer Neg Hx   . Esophageal cancer Neg Hx   . Kidney disease Neg Hx   . Liver disease Neg Hx     Vitals:   01/06/18 1105  BP: (!) 154/74  Pulse: 88  SpO2: 92%  Weight: 71.8 kg (158 lb 6.4 oz)   Wt Readings from Last 3 Encounters:  01/06/18 71.8 kg (158 lb 6.4 oz)  12/23/17 69.7 kg (153 lb 9.6 oz)  12/09/17 66.6 kg (146 lb 12.8 oz)    PHYSICAL EXAM: General:  Well appearing. No respiratory difficulty HEENT: normal Neck:  supple. no JVD. Carotids 2+ bilat; no bruits. No lymphadenopathy or thryomegaly appreciated. Cor: PMI nondisplaced. Regular rate & rhythm. No rubs, gallops.  Lungs: clear Abdomen: soft, nontender, nondistended. No hepatosplenomegaly. No bruits or masses. Good bowel sounds. Extremities: no cyanosis, clubbing, rash, edema Neuro: alert & oriented x 3, cranial nerves grossly intact. moves all 4 extremities w/o difficulty. Affect pleasant.  ASSESSMENT & PLAN: 1. CAD Had [facility] 12/06/2017 S/P Stent Proximal Ramus with PCI to intermidus ramus  On statin, asa, plavix.  No s/s ischemia  Cardiac Rehab pending.  2. Chronic Systolic Heart Failure ECHO EF down from previous 55% -->35-40% with severe LVH and concern for infiltrative process. Suspect due to HTN but will check SPEP/UPEP and PYP scan for completeness.  -No CMRI due to elevated creatinine.  Volume status stable. Continue lasix as needed.  On goal dose carvedilol. No ace or spiro with elevated creatinine. Check Myeloma panel and set up PYP. No CMRI with creatinine >2.  3. HTN BP elevated but he just had medications.  Continue current regimen. Hydralazine was recently increased.  4. S/P Bioprothetic AVR 5. S/P 2016 and 2018 aneurysm repair.  6. CKD Stage III.  Recent creatinine 2 7. Current tobacco Abuse Discussed smoking cessation. He requested nicoderm patch.   Follow up with Dr Aundra Dubin in 8 weeks  [place], NP-C 01/06/2018   Patient seen with NP, agree with the above note.  He is doing well symptomatically s/p admission for NSTEMI.    On exam, he is not volume overloaded.  Clear lungs.  2/6 SEM RUSB.  No edema.   1. CAD: S/p NSTEMI with DES to ramus.  No further chest pain.  - Needs to quit smoking, discussed.  - Continue ASA and Plavix.  - Continue atorvastatin, check lipids next appt.  2. Chronic systolic CHF: Echo from 7/74 reviewed, EF 40-45% range with severe LVH.  It is possible that LVH is related to prolonged  hypertension, but cardiac amyloidosis would also be a consideration. He does not look volume overloaded on exam.  - Cannot get MRI with contrast with his degree of CKD.  Will send myeloma panel and get PYP scan.  - Continue Coreg 25 mg bid.  - Continue hydralazine 100 mg tid with Imdur 120 daily.  - Hold off on ACEI/ARNI/ARB/spironolactone today with elevated creatinine, may be able to add low dose spironolactone in future as creatinine today is down to 1.78.  - He takes Lasix as needed.  3. CKD: Stage 3.  Creatinine 1.78 today (lower than past).  4. HTN: BP high in office today but has not had his morning meds.  He is on clonidine, amlodipine, Coreg, and hydralazine.  I will not change today.  5. Bioprosthetic AVR: With Bentall procedure.  Valve looked stable on last echo in 9/19.  6. S/p repair of Type B aortic dissection: Followed by Dr. [name] at [facility].  7. Smoking: Strongly encouraged him to quit smoking.  8. Hyperlipidemia: Continue atorvastatin, check lipids next appt.   [name] 01/06/2018 4:44 PM

## 2018-01-07 ENCOUNTER — Encounter (HOSPITAL_COMMUNITY): Payer: Self-pay

## 2018-01-07 ENCOUNTER — Telehealth (HOSPITAL_COMMUNITY): Payer: Self-pay

## 2018-01-07 NOTE — Telephone Encounter (Signed)
Attempted to contact pt in regards to CR. Unable to leave VM, full.  Mailed letter

## 2018-01-10 ENCOUNTER — Encounter: Payer: Self-pay | Admitting: Cardiology

## 2018-01-10 DIAGNOSIS — N280 Ischemia and infarction of kidney: Secondary | ICD-10-CM | POA: Diagnosis not present

## 2018-01-10 DIAGNOSIS — I129 Hypertensive chronic kidney disease with stage 1 through stage 4 chronic kidney disease, or unspecified chronic kidney disease: Secondary | ICD-10-CM | POA: Diagnosis not present

## 2018-01-10 DIAGNOSIS — N183 Chronic kidney disease, stage 3 (moderate): Secondary | ICD-10-CM | POA: Diagnosis not present

## 2018-01-10 DIAGNOSIS — D649 Anemia, unspecified: Secondary | ICD-10-CM | POA: Diagnosis not present

## 2018-01-10 DIAGNOSIS — N2581 Secondary hyperparathyroidism of renal origin: Secondary | ICD-10-CM | POA: Diagnosis not present

## 2018-01-10 DIAGNOSIS — Z9889 Other specified postprocedural states: Secondary | ICD-10-CM | POA: Diagnosis not present

## 2018-01-10 DIAGNOSIS — Z952 Presence of prosthetic heart valve: Secondary | ICD-10-CM | POA: Diagnosis not present

## 2018-01-10 DIAGNOSIS — I639 Cerebral infarction, unspecified: Secondary | ICD-10-CM | POA: Diagnosis not present

## 2018-01-10 DIAGNOSIS — Z8679 Personal history of other diseases of the circulatory system: Secondary | ICD-10-CM | POA: Diagnosis not present

## 2018-01-10 DIAGNOSIS — Z720 Tobacco use: Secondary | ICD-10-CM | POA: Diagnosis not present

## 2018-01-10 DIAGNOSIS — I716 Thoracoabdominal aortic aneurysm, without rupture: Secondary | ICD-10-CM | POA: Diagnosis not present

## 2018-01-10 LAB — PROTEIN ELECTROPHORESIS, SERUM
A/G RATIO SPE: 1.3 (ref 0.7–1.7)
ALBUMIN ELP: 3.7 g/dL (ref 2.9–4.4)
Alpha-1-Globulin: 0.1 g/dL (ref 0.0–0.4)
Alpha-2-Globulin: 0.4 g/dL (ref 0.4–1.0)
BETA GLOBULIN: 1 g/dL (ref 0.7–1.3)
GLOBULIN, TOTAL: 2.8 g/dL (ref 2.2–3.9)
Gamma Globulin: 1.2 g/dL (ref 0.4–1.8)
TOTAL PROTEIN ELP: 6.5 g/dL (ref 6.0–8.5)

## 2018-01-10 LAB — MULTIPLE MYELOMA PANEL, SERUM
ALBUMIN SERPL ELPH-MCNC: 3.4 g/dL (ref 2.9–4.4)
ALPHA2 GLOB SERPL ELPH-MCNC: 0.5 g/dL (ref 0.4–1.0)
Albumin/Glob SerPl: 1.1 (ref 0.7–1.7)
Alpha 1: 0.2 g/dL (ref 0.0–0.4)
B-GLOBULIN SERPL ELPH-MCNC: 1.1 g/dL (ref 0.7–1.3)
GAMMA GLOB SERPL ELPH-MCNC: 1.4 g/dL (ref 0.4–1.8)
GLOBULIN, TOTAL: 3.3 g/dL (ref 2.2–3.9)
IGG (IMMUNOGLOBIN G), SERUM: 1348 mg/dL (ref 700–1600)
IgA: 294 mg/dL (ref 90–386)
IgM (Immunoglobulin M), Srm: 83 mg/dL (ref 20–172)
Total Protein ELP: 6.7 g/dL (ref 6.0–8.5)

## 2018-01-13 ENCOUNTER — Telehealth: Payer: Self-pay

## 2018-01-13 NOTE — Telephone Encounter (Signed)
Pt called because he received a certified letter about sending in a remote transmission from his home monitor. His monitor is not working properly and he said he been waiting on someone to call about his monitor. I gave him the number to [employer] so he can order him another monitor.

## 2018-01-14 ENCOUNTER — Telehealth (HOSPITAL_COMMUNITY): Payer: Self-pay

## 2018-01-14 NOTE — Telephone Encounter (Signed)
Per equipment order tracker in [place], new monitor ordered by tech services on 01/13/18.

## 2018-01-14 NOTE — Telephone Encounter (Signed)
2nd attempt to contact pt - lm on vm. Letter was previously sent.

## 2018-01-14 NOTE — Telephone Encounter (Signed)
Per equipment order tracker in [place], new monitor ordered by tech services on 01/13/18.

## 2018-01-14 NOTE — Addendum Note (Signed)
Encounter addended by: Larey Dresser, MD on: 01/14/2018 7:36 AM  Actions taken: Visit diagnoses modified

## 2018-01-21 ENCOUNTER — Telehealth (HOSPITAL_COMMUNITY): Payer: Self-pay

## 2018-01-21 NOTE — Telephone Encounter (Signed)
Manual transmission received and reviewed--all "symptom" episodes appear SR/ST, intermittent oversensing/artifact noted.

## 2018-01-21 NOTE — Telephone Encounter (Signed)
3rd attempt to contact patient in regards to CR - lm on vm

## 2018-01-29 IMAGING — CR DG CHEST 2V
2 series · 2 of 2 positions shown · non-contrast
Comparison: CT chest 03/21/2015.  PA and lateral chest 03/20/2015.

CLINICAL DATA: Right chest and upper abdominal pain for several
days. Initial encounter.

EXAM:
CHEST  2 VIEW

[chest pa]
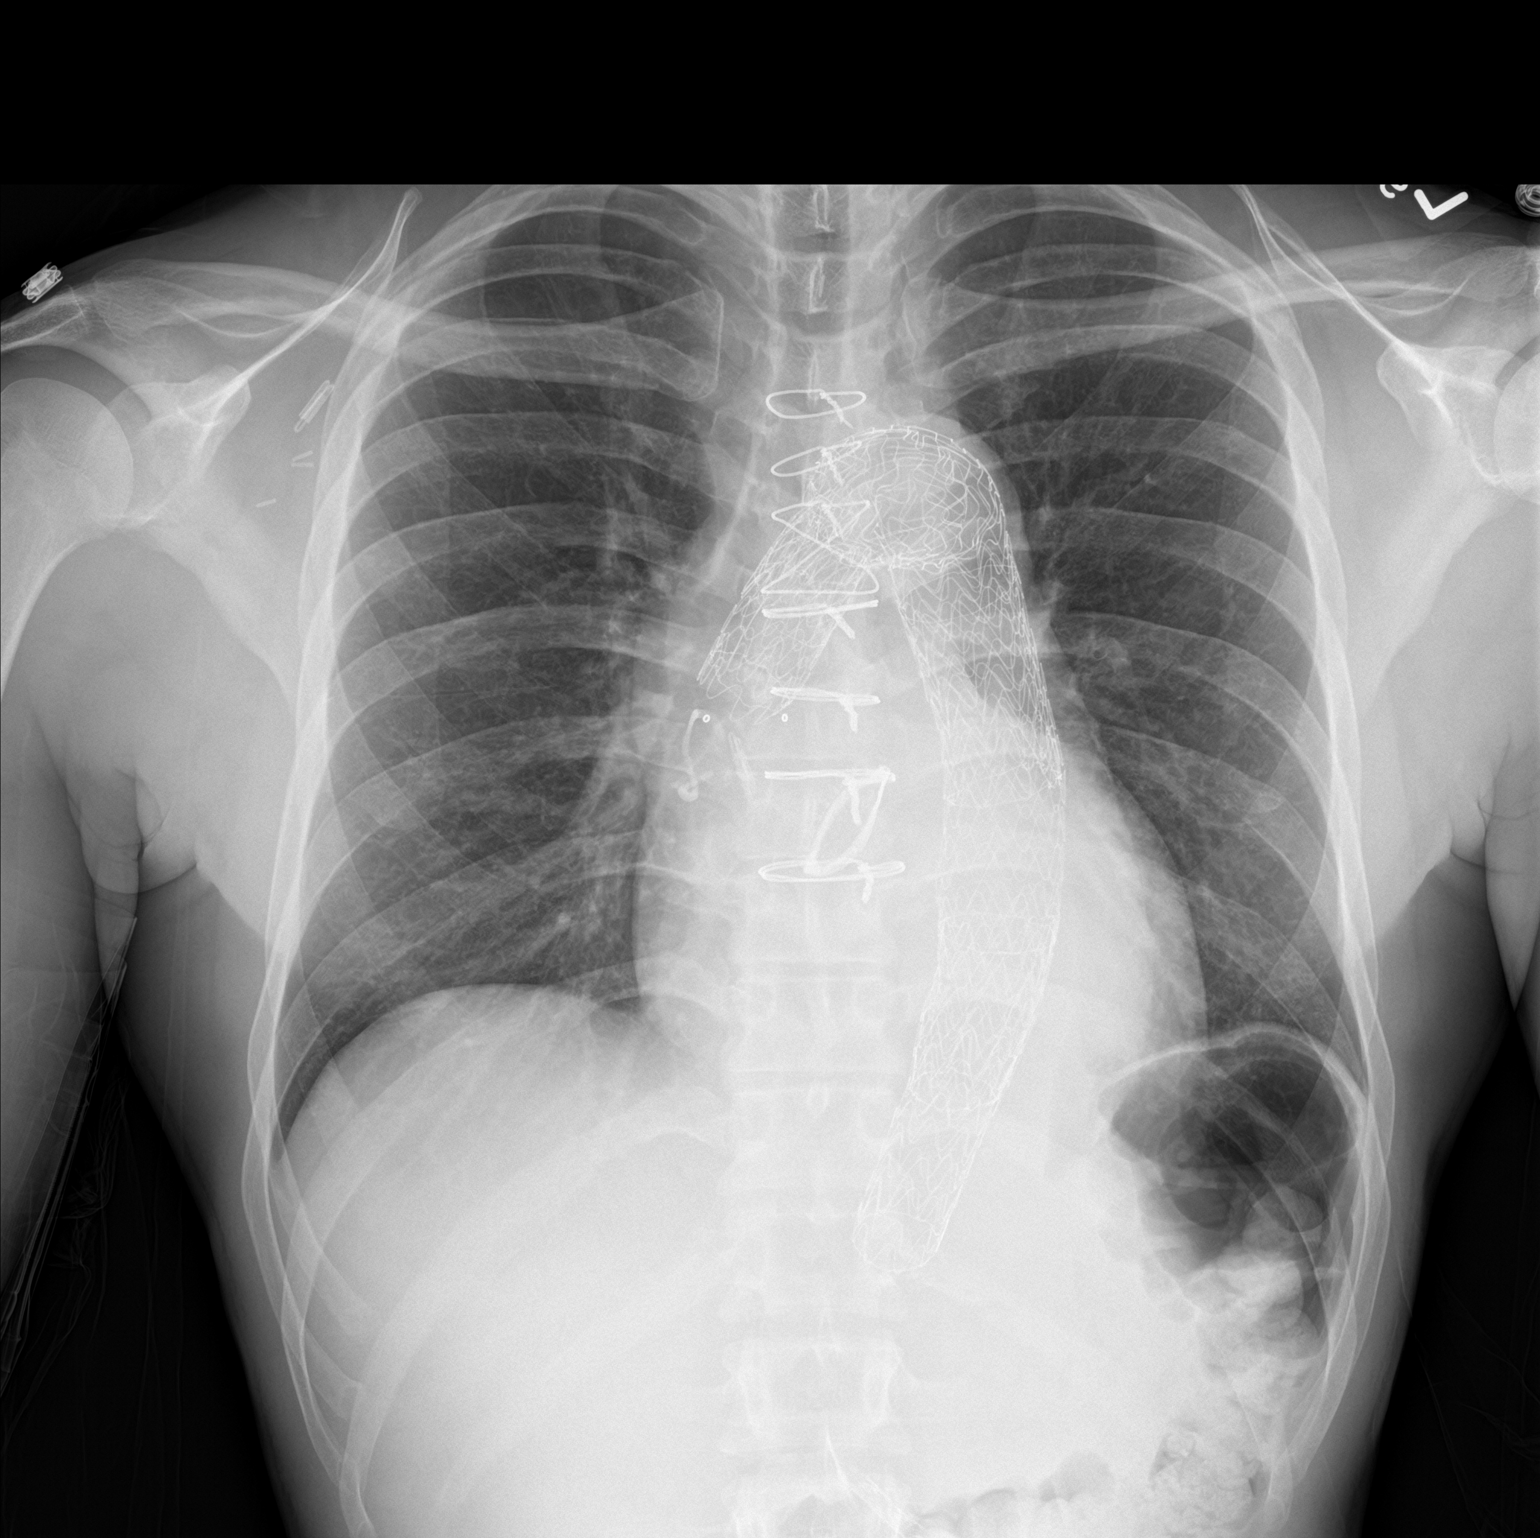

[chest lat]
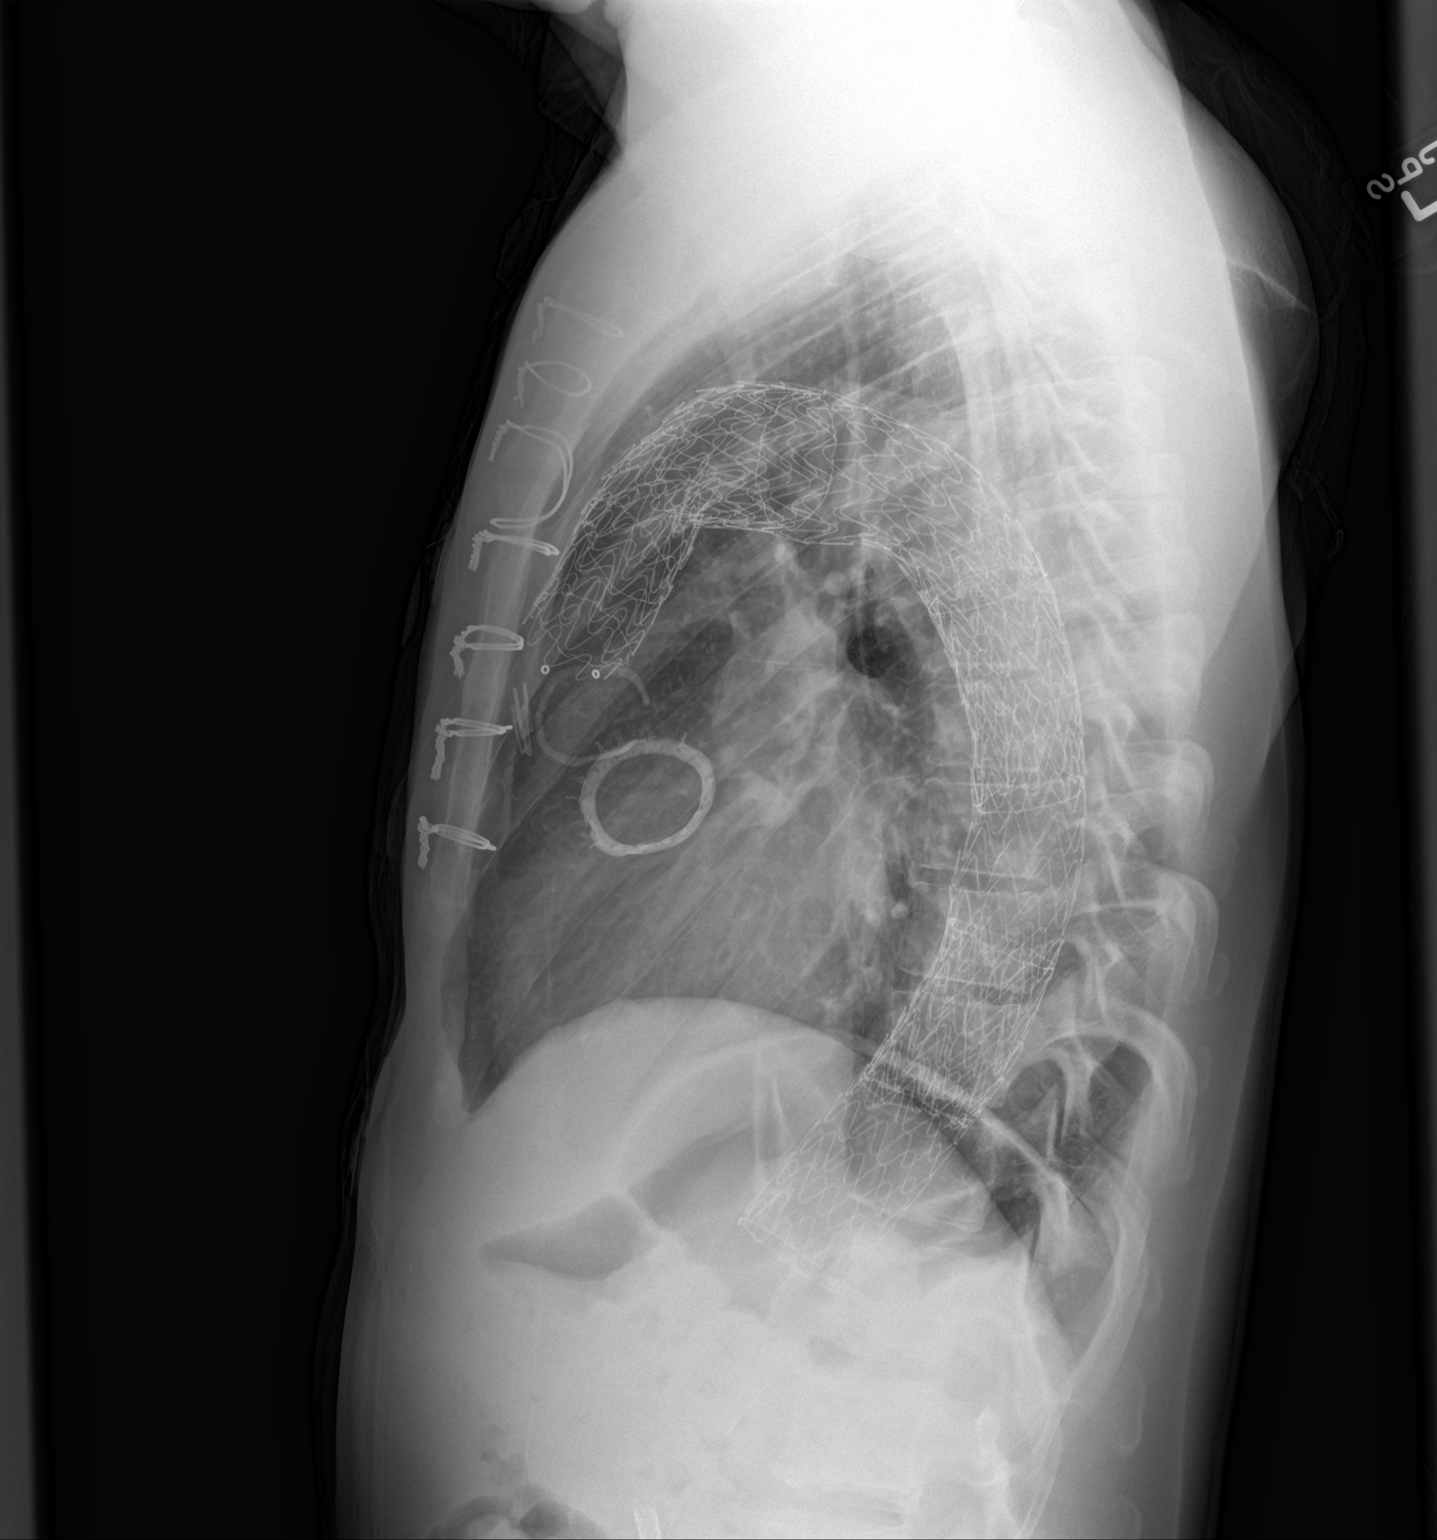

[2 of 2 positions shown; findings below may reference images not displayed]

FINDINGS: Prosthetic aortic valve and aortic stent graft are are again seen.
The lungs are clear. Heart size is mildly enlarged. No pneumothorax
or pleural effusion. No focal bony abnormality. Surgical clips right
axilla noted.
IMPRESSION: No acute disease.

## 2018-01-31 DIAGNOSIS — Z0289 Encounter for other administrative examinations: Secondary | ICD-10-CM

## 2018-02-07 ENCOUNTER — Ambulatory Visit (INDEPENDENT_AMBULATORY_CARE_PROVIDER_SITE_OTHER): Payer: Medicare Other

## 2018-02-07 DIAGNOSIS — I639 Cerebral infarction, unspecified: Secondary | ICD-10-CM | POA: Diagnosis not present

## 2018-02-07 DIAGNOSIS — R002 Palpitations: Secondary | ICD-10-CM

## 2018-02-07 NOTE — Progress Notes (Signed)
[facility] Summary Report / Loop Recorder 

## 2018-03-14 ENCOUNTER — Ambulatory Visit (INDEPENDENT_AMBULATORY_CARE_PROVIDER_SITE_OTHER): Payer: Medicare Other

## 2018-03-14 DIAGNOSIS — I639 Cerebral infarction, unspecified: Secondary | ICD-10-CM

## 2018-03-14 NOTE — Progress Notes (Signed)
[facility] Summary Report / Loop Recorder 

## 2018-03-15 LAB — CUP PACEART REMOTE DEVICE CHECK
Date Time Interrogation Session: 20191228130946
MDC IDC PG IMPLANT DT: 20180406

## 2018-03-25 ENCOUNTER — Telehealth (HOSPITAL_COMMUNITY): Payer: Self-pay

## 2018-03-25 ENCOUNTER — Encounter (HOSPITAL_COMMUNITY): Payer: Self-pay | Admitting: Cardiology

## 2018-03-25 ENCOUNTER — Ambulatory Visit (HOSPITAL_COMMUNITY)
Admission: RE | Admit: 2018-03-25 | Discharge: 2018-03-25 | Disposition: A | Discharge status: Home / Self Care | Payer: Medicare HMO | Source: Ambulatory Visit | Attending: Cardiology | Admitting: Cardiology

## 2018-03-25 VITALS — BP 160/80 | HR 87 | Wt 151.6 lb

## 2018-03-25 DIAGNOSIS — Z953 Presence of xenogenic heart valve: Secondary | ICD-10-CM | POA: Diagnosis not present

## 2018-03-25 DIAGNOSIS — J42 Unspecified chronic bronchitis: Secondary | ICD-10-CM | POA: Diagnosis not present

## 2018-03-25 DIAGNOSIS — I509 Heart failure, unspecified: Secondary | ICD-10-CM

## 2018-03-25 DIAGNOSIS — I429 Cardiomyopathy, unspecified: Secondary | ICD-10-CM | POA: Insufficient documentation

## 2018-03-25 DIAGNOSIS — I712 Thoracic aortic aneurysm, without rupture: Secondary | ICD-10-CM | POA: Diagnosis not present

## 2018-03-25 DIAGNOSIS — Z7902 Long term (current) use of antithrombotics/antiplatelets: Secondary | ICD-10-CM | POA: Diagnosis not present

## 2018-03-25 DIAGNOSIS — F17210 Nicotine dependence, cigarettes, uncomplicated: Secondary | ICD-10-CM | POA: Diagnosis not present

## 2018-03-25 DIAGNOSIS — I5022 Chronic systolic (congestive) heart failure: Secondary | ICD-10-CM | POA: Insufficient documentation

## 2018-03-25 DIAGNOSIS — I2510 Atherosclerotic heart disease of native coronary artery without angina pectoris: Secondary | ICD-10-CM | POA: Insufficient documentation

## 2018-03-25 DIAGNOSIS — Z79899 Other long term (current) drug therapy: Secondary | ICD-10-CM | POA: Insufficient documentation

## 2018-03-25 DIAGNOSIS — Z885 Allergy status to narcotic agent status: Secondary | ICD-10-CM | POA: Insufficient documentation

## 2018-03-25 DIAGNOSIS — G8929 Other chronic pain: Secondary | ICD-10-CM | POA: Diagnosis not present

## 2018-03-25 DIAGNOSIS — N183 Chronic kidney disease, stage 3 unspecified: Secondary | ICD-10-CM

## 2018-03-25 DIAGNOSIS — I252 Old myocardial infarction: Secondary | ICD-10-CM | POA: Insufficient documentation

## 2018-03-25 DIAGNOSIS — Z8249 Family history of ischemic heart disease and other diseases of the circulatory system: Secondary | ICD-10-CM | POA: Diagnosis not present

## 2018-03-25 DIAGNOSIS — Z8673 Personal history of transient ischemic attack (TIA), and cerebral infarction without residual deficits: Secondary | ICD-10-CM | POA: Insufficient documentation

## 2018-03-25 DIAGNOSIS — Z9889 Other specified postprocedural states: Secondary | ICD-10-CM | POA: Diagnosis not present

## 2018-03-25 DIAGNOSIS — E785 Hyperlipidemia, unspecified: Secondary | ICD-10-CM | POA: Diagnosis not present

## 2018-03-25 DIAGNOSIS — K219 Gastro-esophageal reflux disease without esophagitis: Secondary | ICD-10-CM | POA: Diagnosis not present

## 2018-03-25 DIAGNOSIS — I5032 Chronic diastolic (congestive) heart failure: Secondary | ICD-10-CM

## 2018-03-25 DIAGNOSIS — I130 Hypertensive heart and chronic kidney disease with heart failure and stage 1 through stage 4 chronic kidney disease, or unspecified chronic kidney disease: Secondary | ICD-10-CM | POA: Diagnosis not present

## 2018-03-25 DIAGNOSIS — I10 Essential (primary) hypertension: Secondary | ICD-10-CM

## 2018-03-25 DIAGNOSIS — Z7982 Long term (current) use of aspirin: Secondary | ICD-10-CM | POA: Insufficient documentation

## 2018-03-25 DIAGNOSIS — Z888 Allergy status to other drugs, medicaments and biological substances status: Secondary | ICD-10-CM | POA: Insufficient documentation

## 2018-03-25 DIAGNOSIS — I25119 Atherosclerotic heart disease of native coronary artery with unspecified angina pectoris: Secondary | ICD-10-CM

## 2018-03-25 LAB — CBC
HEMATOCRIT: 42.2 % (ref 39.0–52.0)
HEMOGLOBIN: 13.9 g/dL (ref 13.0–17.0)
MCH: 30.8 pg (ref 26.0–34.0)
MCHC: 32.9 g/dL (ref 30.0–36.0)
MCV: 93.4 fL (ref 80.0–100.0)
Platelets: 128 10*3/uL — ABNORMAL LOW (ref 150–400)
RBC: 4.52 MIL/uL (ref 4.22–5.81)
RDW: 12.8 % (ref 11.5–15.5)
WBC: 7.4 10*3/uL (ref 4.0–10.5)
nRBC: 0 % (ref 0.0–0.2)

## 2018-03-25 LAB — LIPID PANEL
Cholesterol: 177 mg/dL (ref 0–200)
HDL: 37 mg/dL — AB (ref >40)
LDL Cholesterol: 121 mg/dL — ABNORMAL HIGH (ref 0–99)
TRIGLYCERIDES: 96 mg/dL (ref <150)
Total CHOL/HDL Ratio: 4.8 RATIO
VLDL: 19 mg/dL (ref 0–40)

## 2018-03-25 LAB — BASIC METABOLIC PANEL
ANION GAP: 10 (ref 5–15)
BUN: 17 mg/dL (ref 6–20)
CALCIUM: 8.9 mg/dL (ref 8.9–10.3)
CO2: 17 mmol/L — AB (ref 22–32)
Chloride: 111 mmol/L (ref 98–111)
Creatinine, Ser: 1.9 mg/dL — ABNORMAL HIGH (ref 0.61–1.24)
GFR, EST AFRICAN AMERICAN: 48 mL/min — AB (ref >60)
GFR, EST NON AFRICAN AMERICAN: 42 mL/min — AB (ref >60)
Glucose, Bld: 81 mg/dL (ref 70–99)
Potassium: 4.3 mmol/L (ref 3.5–5.1)
Sodium: 138 mmol/L (ref 135–145)

## 2018-03-25 MED ORDER — SPIRONOLACTONE 25 MG PO TABS: 25.0000 mg | ORAL_TABLET | Freq: Every day | ORAL | 3 refills | Status: DC

## 2018-03-25 MED ORDER — ISOSORBIDE MONONITRATE ER 60 MG PO TB24: 60.0000 mg | ORAL_TABLET | Freq: Every day | ORAL | 3 refills | Status: DC

## 2018-03-25 NOTE — Telephone Encounter (Signed)
Pt called given lab results pt is taken atorvastatin 80mg  will continue to take it and will have repeat labs pt stated he will be back in a month and wants to have his labs done then,

## 2018-03-25 NOTE — Telephone Encounter (Signed)
Pt called given x-ray results

## 2018-03-25 NOTE — Telephone Encounter (Signed)
Pt called no answer unable to leave voice mail No pneumonia noted.

## 2018-03-25 NOTE — Patient Instructions (Signed)
Labs done today  Labs will need to be done in 10-14 days  EKG done today  Your physician has recommended that you have a pulmonary function test. Pulmonary Function Tests are a group of tests that measure how well air moves in and out of your lungs.  Your physician has recommended you have a PYP scan. This needs to be pre authorized by your insurance. Once that is done we will call you in order to schedule your appointment.  A chest x-ray takes a picture of the organs and structures inside the chest, including the heart, lungs, and blood vessels. This test can show several things, including, whether the heart is enlarges; whether fluid is building up in the lungs; and whether pacemaker / defibrillator leads are still in place.  RESTART Imdur 60mg  (1 tab) daily  START Spironolactone 25mg (1 tab) daily  Follow up with the [facility] in 1 month

## 2018-03-26 NOTE — Progress Notes (Signed)
ADVANCED HF CLINIC CONSULT NOTE  Referring Physician: Almyra Deforest PA-C Primary Care: Domenica Fail PA-C  Primary Cardiologist: Dr Aundra Dubin  Nephrology: Dr [name] [facility]: Dr [name]  CT Surgeon  HPI: [name] is a 48 year old with a history of CAD, NSTEMI 11/2017,  smoker, CKD Stage III, HTN, AAA 2016 at [facility] , open repair of TAAA secondary to chronic dissection with multibranch dacron  graft 12/2016, CVA 2017, loop recorder, bioprothetic AVR 2016, HLD.   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  The descending thoracic aorta has dilated to 5.1 cm.  Cardiac catheterization in April 2016 demonstrated 60% first diagonal lesion.  He underwent aortic valve replacement with bovine pericardial tissue valve and reconstruction along with aortic root replacement and endovascular repair of the descending thoracic aortic aneurysm at [facility].  He has had intermittent chest discomfort since then.  Admitted in 12/02/2017 chest pain. Had NSTEMI. He had LHC with stent placed proximal ramus. He underwent staged PCI of of ramus intermedius. Continuew asa 81 mg daily + plavix.    Today he presents for followup of CAD and CHF. He is down to smoking 2 cigarettes/day, rare ETOH.  He has been feeling bad for about a week, has had cough with yellow sputum and congestion.  He has right-sided chest pain that has been fairly constant, worse when lying on his left side.  Also has chronic abdominal pain at his prior surgical site.  He has stable dyspnea, gets short of breath walking up stairs.  He has generalized fatigue.  He has not been using Lasix.   Labs (10/19): K 4.1, creatinine 1.78, myeloma panel negative  ECG: NSR, LVH with repolarization abnormality (personally reviewed).   Review of Systems: All systems reviewed and negative except as per HPI.   PMH: 1. H/o ankle  fractures bilaterally 2. GERD 3. HTN: Negative urinary catecholeamine collection for pheochromocytoma.  4. CKD: Stage 3. Suspect hypertensive nephropathy.  5. Cardiomyopathy: Most likely due to heavy ETOH ingestion and uncontrolled HTN.   Echo (3/13) with EF 35-40%, diffuse HK, mild LVH, moderate to severe eccentric Andre, mild to moderate AI with left coronary cusp prolapse, PA systolic pressure 38 mmHg.  Lexiscan myoview (4/13): EF 37%, global hypokinesis, no ischemic or infarction.  Echo (12/14) with EF 45-50%, diffuse hypokinesis, moderate LVH, mild Andre, moderate AI.  Echo (4/16) with EF 55-60%, severe LVH, mild LV dilation, moderate AI and moderate Andre.   - Echo (9/19): EF 35-40% with diffuse hypokinesis, severe LVH, bioprosthetic aortic valve functioning normally.  6. ETOH abuse: Quit 3/13.  7. Active smoker. 8. Valvular heart disease: Echo in 3/13 showed moderate to severe eccentric Andre and mild to moderate AI with prolapsing left coronary cusp. Echo 12/14 with mild Andre and moderate AI.  Echo 4/16 with moderate AI and moderate Andre.  - S/p Bentall procedure with bioprosthetic aortic valve 9/16.  9.  CVA: Has ILR. 10. Type B aortic dissection: Occurred in 2/16, from just distal to the left subclavian.  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    - 10/03/2014 First stage median sternotomy with right axillary cannulation for aortic valved conduit root replacement with coronary reconstruction (button Bentall Procedure), 27 mm Sorin Mitroflow bovine pericardial valved conduit, total arch replacement (9mm Vascutek [place] multibranch arch graft with individual head vessel reimplantation. - Status  post open repair of an Extent III TAA secondary to chronic dissection with a # 59mm Vascutek "Coselli" multibranch Dacron graft with individual celiac, SMA, and bilateral renal artery implantationon 01/04/2017, [facility].  11. CAD: LHC (4/16) with 60% ostial D1.   - NSTEMI 9/19: LHC (9/19) showed  50% proximal LCx, 90% ramus => DES to ramus.    Current Outpatient Medications  Medication Sig Dispense Refill  . acetaminophen (TYLENOL) 325 MG tablet Take 650 mg by mouth every 6 (six) hours as needed (for pain or headaches).     [name] amLODipine (NORVASC) 10 MG tablet Take 1 tablet (10 mg total) by mouth daily. 30 tablet 5  . aspirin EC 81 MG EC tablet Take 1 tablet (81 mg total) by mouth daily. 90 tablet 3  . atorvastatin (LIPITOR) 80 MG tablet Take 1 tablet (80 mg total) by mouth daily. 90 tablet 3  . carvedilol (COREG) 25 MG tablet Take 1 tablet (25 mg total) by mouth 2 (two) times daily with a meal. 180 tablet 3  . cloNIDine (CATAPRES - DOSED IN MG/24 HR) 0.3 mg/24hr patch Place 1 patch (0.3 mg total) onto the skin once a week. (Patient taking differently: Place 0.3 mg onto the skin every Friday. ) 4 patch 6  . clopidogrel (PLAVIX) 75 MG tablet Take 1 tablet (75 mg total) by mouth daily with breakfast. 90 tablet 3  . furosemide (LASIX) 40 MG tablet Take one tablet daily as needed for shortness of breath or weight gain of more than 3 lbs in one day. 30 tablet 1  . gabapentin (NEURONTIN) 300 MG capsule Take 2 capsules (600 mg total) by mouth 3 (three) times daily. 180 capsule 2  . hydrALAZINE (APRESOLINE) 100 MG tablet Take 100 mg by mouth 2 (two) times daily.    . isosorbide mononitrate (IMDUR) 60 MG 24 hr tablet Take 1 tablet (60 mg total) by mouth daily. 30 tablet 3  . spironolactone (ALDACTONE) 25 MG tablet Take 1 tablet (25 mg total) by mouth daily. 90 tablet 3   No current facility-administered medications for this encounter.     Allergies  Allergen Reactions  . Imdur [Isosorbide Nitrate] Other (See Comments)    Headaches   . Tramadol Other (See Comments)    Headaches       Social History   Socioeconomic History  . Marital status: Married    Spouse name: Not on file  . Number of children: 5  . Years of education: 13  . Highest education level: Not on file  Occupational  History  . Occupation: Disabled, part-time Programmer, systems    Comment: Disability  Social Needs  . Financial resource strain: Not hard at all  . Food insecurity:    Worry: Never true    Inability: Never true  . Transportation needs:    Medical: No    Non-medical: No  Tobacco Use  . Smoking status: Current Some Day Smoker    Packs/day: 0.25    Years: 23.00    Pack years: 5.75    Types: Cigarettes  . Smokeless tobacco: Never Used  . Tobacco comment: 3 to 4 per day  Substance and Sexual Activity  . Alcohol use: Yes    Alcohol/week: 1.0 standard drinks    Types: 1 Cans of beer per week    Comment: occasionally  . Drug use: Yes    Types: Marijuana    Comment: 2 times/week  . Sexual activity: Yes  Lifestyle  . Physical activity:  Days per week: Not on file    Minutes per session: Not on file  . Stress: Not on file  Relationships  . Social connections:    Talks on phone: Not on file    Gets together: Not on file    Attends religious service: Not on file    Active member of club or organization: Not on file    Attends meetings of clubs or organizations: Not on file    Relationship status: Not on file  . Intimate partner violence:    Fear of current or ex partner: Not on file    Emotionally abused: Not on file    Physically abused: Not on file    Forced sexual activity: Not on file  Other Topics Concern  . Not on file  Social History Narrative   Fun: Enjoy his children      Family History  Problem Relation Age of Onset  . Hypertension Mother   . Hypertension Unknown   . Colon cancer Paternal Uncle   . Stroke Maternal Aunt   . Heart attack Brother   . Diabetes Maternal Aunt   . Lung cancer Maternal Uncle   . Other Sister        "breathing machine at night"  . Headache Sister   . Thyroid disease Sister   . Throat cancer Neg Hx   . Pancreatic cancer Neg Hx   . Esophageal cancer Neg Hx   . Kidney disease Neg Hx   . Liver disease Neg Hx    ROS: All systems  reviewed and negative except as per HPI.   Vitals:   03/25/18 0840  BP: (!) 160/80  Pulse: 87  SpO2: 98%  Weight: 68.8 kg (151 lb 9.6 oz)   Wt Readings from Last 3 Encounters:  03/25/18 68.8 kg (151 lb 9.6 oz)  01/06/18 71.8 kg (158 lb 6.4 oz)  12/23/17 69.7 kg (153 lb 9.6 oz)    PHYSICAL EXAM: General: NAD Neck: No JVD, no thyromegaly or thyroid nodule.  Lungs: Distant breath sounds.  CV: Nondisplaced PMI.  Heart regular S1/S2, no S3/S4, 3/6 early SEM RUSB.  No peripheral edema.  No carotid bruit.  Normal pedal pulses.  Abdomen: Soft, nontender, no hepatosplenomegaly, no distention.  Skin: Intact without lesions or rashes.  Neurologic: Alert and oriented x 3.  Psych: Normal affect. Extremities: No clubbing or cyanosis.  HEENT: Normal.   ASSESSMENT & PLAN: 1. CAD: 9/19 NSTEMI with PCI to proximal ramus.  No ischemic-type chest pain.  - Continue ASA 81, Plavix.  - He is on atorvastatin 80 daily, check lipids today.  2. Chronic Systolic Heart Failure: Echo in 9/19 with EF 35-40% with severe LVH and concern for infiltrative process. Myeloma panel negative, suspect due to HTN.  He is not volume overloaded on exam and is not using Lasix.  Cannot get cardiac MRI with elevated creatinine.  - Will order PYP scan to assess for TTR amyloidosis.  - Continue Coreg 25 mg bid.  - Continue hydralazine 100 mg tid, will add back Imdur 60 mg daily (headache with Imdur 120 mg daily).  - Start spironolactone 12.5 mg daily.  Check BMET today and again in 10 days.   3. HTN: BP high, adding spironolactone today (follow K and creatinine closely).   4. S/P Bioprothetic AVR: Valve was stable on 9/19 echo.  5. S/P 2016 and 2018 thoracic aortic aneurysm repair: Followed by Dr. [name] at [facility].  6. CKD Stage III: BMET today.  7. Active smoker: He is cutting back, again recommended cessation.  - I will get PFTs to see if COPD is contributing to his dyspnea as he does not appear volume overloaded.  8.  Cough/bronchitis: I will get a CXR today.    [name] 03/26/2018

## 2018-03-27 LAB — CUP PACEART REMOTE DEVICE CHECK
Implantable Pulse Generator Implant Date: 20180406
MDC IDC SESS DTM: 20191125124055

## 2018-03-29 ENCOUNTER — Emergency Department (HOSPITAL_COMMUNITY)
Admission: EM | Admit: 2018-03-29 | Discharge: 2018-03-29 | Disposition: A | Discharge status: Home / Self Care | Payer: Medicare HMO | Attending: Emergency Medicine | Admitting: Emergency Medicine

## 2018-03-29 ENCOUNTER — Encounter (HOSPITAL_COMMUNITY): Payer: Medicare Other

## 2018-03-29 ENCOUNTER — Telehealth (HOSPITAL_COMMUNITY): Payer: Self-pay | Admitting: Vascular Surgery

## 2018-03-29 DIAGNOSIS — I5042 Chronic combined systolic (congestive) and diastolic (congestive) heart failure: Secondary | ICD-10-CM | POA: Diagnosis not present

## 2018-03-29 DIAGNOSIS — I110 Hypertensive heart disease with heart failure: Secondary | ICD-10-CM | POA: Diagnosis not present

## 2018-03-29 DIAGNOSIS — Y999 Unspecified external cause status: Secondary | ICD-10-CM | POA: Insufficient documentation

## 2018-03-29 DIAGNOSIS — I2510 Atherosclerotic heart disease of native coronary artery without angina pectoris: Secondary | ICD-10-CM | POA: Diagnosis not present

## 2018-03-29 DIAGNOSIS — I130 Hypertensive heart and chronic kidney disease with heart failure and stage 1 through stage 4 chronic kidney disease, or unspecified chronic kidney disease: Secondary | ICD-10-CM | POA: Diagnosis not present

## 2018-03-29 DIAGNOSIS — W268XXA Contact with other sharp object(s), not elsewhere classified, initial encounter: Secondary | ICD-10-CM | POA: Insufficient documentation

## 2018-03-29 DIAGNOSIS — Z7901 Long term (current) use of anticoagulants: Secondary | ICD-10-CM | POA: Insufficient documentation

## 2018-03-29 DIAGNOSIS — Y929 Unspecified place or not applicable: Secondary | ICD-10-CM | POA: Insufficient documentation

## 2018-03-29 DIAGNOSIS — Z7982 Long term (current) use of aspirin: Secondary | ICD-10-CM | POA: Diagnosis not present

## 2018-03-29 DIAGNOSIS — N183 Chronic kidney disease, stage 3 (moderate): Secondary | ICD-10-CM | POA: Diagnosis not present

## 2018-03-29 DIAGNOSIS — I252 Old myocardial infarction: Secondary | ICD-10-CM | POA: Insufficient documentation

## 2018-03-29 DIAGNOSIS — S61411A Laceration without foreign body of right hand, initial encounter: Secondary | ICD-10-CM | POA: Diagnosis not present

## 2018-03-29 DIAGNOSIS — Z79899 Other long term (current) drug therapy: Secondary | ICD-10-CM | POA: Diagnosis not present

## 2018-03-29 DIAGNOSIS — Y9389 Activity, other specified: Secondary | ICD-10-CM | POA: Insufficient documentation

## 2018-03-29 DIAGNOSIS — F17210 Nicotine dependence, cigarettes, uncomplicated: Secondary | ICD-10-CM | POA: Diagnosis not present

## 2018-03-29 MED ORDER — HYDROCODONE-ACETAMINOPHEN 5-325 MG PO TABS
2.0000 | ORAL_TABLET | Freq: Once | ORAL | Status: AC
  Administered 2018-03-29: 2 via ORAL
  Filled 2018-03-29: qty 2

## 2018-03-29 MED ORDER — LIDOCAINE-EPINEPHRINE (PF) 2 %-1:200000 IJ SOLN
10.0000 mL | Freq: Once | INTRAMUSCULAR | Status: AC
  Administered 2018-03-29: 10 mL
  Filled 2018-03-29: qty 20

## 2018-03-29 NOTE — ED Triage Notes (Signed)
Patient to ED with laceration to R palm - about 30 minutes ago, he reports trying to move a TV and lifted the base, which was broken and cut his hand. Denies new numbness/tingling or weakness in fingers or hand. Bleeding currently controlled with pressure.

## 2018-03-29 NOTE — Telephone Encounter (Signed)
Left message with pt wife giving PYP date and time 04/05/18 @ 11am, pt wife states she will relay message and have pt at appt

## 2018-03-29 NOTE — Discharge Instructions (Addendum)
Suture removal in 10 days

## 2018-03-30 NOTE — ED Provider Notes (Signed)
[facility] EMERGENCY DEPARTMENT Provider Note   CSN: [id number] Arrival date & time: 03/29/18  1714     History   Chief Complaint Chief Complaint  Patient presents with  . Laceration    HPI [name] is a 48 y.o. male.  The history is provided by the patient. No language interpreter was used.  Laceration  Location:  Hand Hand laceration location:  R hand and R palm Length:  2.5  Depth:  Through dermis Laceration mechanism:  Blunt object Pain details:    Quality:  Aching   Severity:  No pain   Timing:  Constant   Progression:  Worsening Relieved by:  Nothing Worsened by:  Nothing Ineffective treatments:  None tried Pt complains of a cut to hand from base of a TV  Past Medical History:  Diagnosis Date  . Adenomatous colon polyp 08/2015  . Anxiety   . Aortic disease ([place])   . Aortic dissection (H[facility])    a. admx 04/2014 >> L renal infarct; a/c renal failure >> b.  s/p Bioprosthetic [place] and total arch replacement and staged endovascular repair of descending aortic aneurysm ([facility] - Dr. [name])  . CAD (coronary artery disease)    a. LHC 4/16:  oD1 60%  . Cardiomyopathy ([place])    a. non-ischemic - probably related to untreated HTN and ETOH abuse - Echo 3/13 with EF 35-40% >> b. Echo 4/16: Severe LVH, EF 55-60%, moderate AI, moderate MR, mild LAE, trivial effusion, known type B dissection with communication between true and false lumens with suprasternal images suggesting dissection plane may propagate to at least left subclavian takeoff, root above aortic valve okay    . Chronic abdominal pain   . Chronic combined systolic and diastolic congestive heart failure ([place])    a. 05/2011: Adm with pulm edema/HTN urgency, EF 35-40% with diffuse hypokinesis and moderate to severe mitral regurgitation. Cardiomyopathy likely due to uncontrolled HTN and ETOH abuse - cath deferred due to renal insufficiency (felt due to uncontrolled HTN). bJodie Echevaria MV 06/2011: EF  37% and no ischemia or infarction. c. EF 45-50% by echo 01/2012.  [name] Chronic sinusitis   . CKD (chronic kidney disease)    a. 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  peak creatinine 3.46 during admx for aortic dissection 2/16.  sees Dr [name]  . Colon polyps 09/11/2015   Descending, sigmoid polyps  . DDD (degenerative disc disease), lumbar   . Depression   . Descending thoracic aortic aneurysm ([place])   . Dissecting aneurysm of thoracic aorta ([place])   . ETOH abuse    a. Reported to have quit 05/2011.  . Frequent headaches   . GERD (gastroesophageal reflux disease)   . Headache(784.0)    "q other day" (08/08/2013)  . Heart murmur   . Hemorrhoid thrombosis   . History of echocardiogram    Echo 1/17:  Severe LVH, EF 55-60%, no RWMA, Gr 2 DD, AVR ok, mild to mod MR, mild LAE, mild reduced RVSF, mod RAE  . History of medication noncompliance   . HYPERLIPIDEMIA   . Hypertension    a. Hx of HTN urgency secondary to noncompliance. b. urinary metanephrine and catecholeamine levels normal 2013.  c. Renal art [name] 1/16:  No evidence of renal artery stenosis noted bilaterally.  . INGUINAL HERNIA   . Pneumonia ~ 2013  . Renal insufficiency   . Stroke ([place])   . Tobacco abuse   . Valvular heart disease    a. Echo  05/2011: moderate to severe eccentric MR and mild to moderate AI with prolapsing left coronary cusp. b. Echo 01/2012: mild-mod AI, mild dilitation of aortic root, mild MR.;  c. Echo 1/16: Severe LVH consistent with hypertrophic cardio myopathy, EF 50%, no RWMA, mod AI, mild MR, mild RAE, dilated Ao root (40 mm);     Patient Active Problem List   Diagnosis Date Noted  . Acute on chronic combined systolic and diastolic CHF (congestive heart failure) ([place]) 12/08/2017  . Coronary artery disease involving native coronary artery of native heart with angina pectoris ([place]) 12/06/2017  . NSTEMI (non-ST elevated myocardial infarction) ([place]) 12/02/2017  . Hypertensive emergency - Accelerated Hypertension  12/02/2017  . History of stroke 10/05/2017  . Somnolence, daytime 04/07/2017  . Snoring 04/07/2017  . Cerebral infarction due to embolism of left middle cerebral artery ([place]) 06/03/2016  . History of aortic dissection 06/03/2016  . Palpitation   . Cerebrovascular accident (CVA) due to embolism of left middle cerebral artery ([place])   . Seizures ([place]) 02/28/2016  . HLD (hyperlipidemia) 08/28/2015  . 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 Cardiomyopathy ([place]) 03/01/2015  . Acid reflux 03/01/2015  . Essential hypertension 03/01/2015  . S/P aortic dissection repair 10/03/2014  . H/O aortic valve replacement 10/03/2014  . CKD (chronic kidney disease) 05/15/2014  . AKI (acute kidney injury) ([place])   . 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 Aortic dissection ([place]) 04/23/2014  . Aneurysm of ascending aorta (HCC) 03/28/2014  . Dyspnea 03/27/2014  . ETOH abuse 09/20/2013  . Malignant hypertension 02/20/2013  . Hypertensive urgency 01/27/2012  . Cardiomyopathy, hypertensive ([place]) 01/26/2012  . AI (aortic insufficiency) 01/26/2012  . MR (mitral regurgitation) 01/26/2012  . Acute renal failure superimposed on stage 3 chronic kidney disease ([place]) 01/26/2012  . Hypertensive cardiomyopathy, without heart failure ([place]): EF reduced to 35-40% from 55%. 06/10/2011  . Chronic bronchitis 05/23/2011  . Cardiomegaly - hypertensive 05/22/2011  . Marijuana use 05/22/2011  . Abdominal pain 05/22/2011  . Hypertension, malignant 05/22/2011  . Anxiety and depression 05/22/2011  . Hyperlipidemia LDL goal <70 08/26/2009  . Tobacco use 08/26/2009  . Headache(784.0) 08/26/2009  . Aortic valve disorder 03/11/2009  . INGUINAL HERNIA 02/18/2009  . Uncontrolled hypertension 01/16/2009    Past Surgical History:  Procedure Laterality Date  . ANKLE SURGERY Bilateral    Fractures bilaterally  . AORTIC VALVE SURGERY  09/2014  . CORONARY ANGIOGRAPHY N/A  12/06/2017   Procedure: CORONARY ANGIOGRAPHY;  Surgeon: Belva Crome, MD;  Location: [facility];  Service: Cardiovascular;  Laterality: N/A;  . CORONARY STENT INTERVENTION N/A 12/10/2017   Procedure: CORONARY STENT INTERVENTION;  Surgeon: [name] Sine, MD;  Location: [facility];  Service: Cardiovascular;  Laterality: N/A;  . FOOT FRACTURE SURGERY Bilateral 2004-2010   "got pins in both of them"  . HEMORRHOID SURGERY N/A 06/15/2015   Procedure: HEMORRHOIDECTOMY;  Surgeon: Stark Klein, MD;  Location: [facility];  Service: General;  Laterality: N/A;  . INGUINAL HERNIA REPAIR Right ~ 1996  . LEFT HEART CATHETERIZATION WITH CORONARY ANGIOGRAM N/A 06/21/2014   Procedure: LEFT HEART CATHETERIZATION WITH CORONARY ANGIOGRAM;  Surgeon: Larey Dresser, MD;  Location: [facility] CATH LAB;  Service: Cardiovascular;  Laterality: N/A;  . LOOP RECORDER INSERTION N/A 06/19/2016   Procedure: Loop Recorder Insertion;  Surgeon: Deboraha Sprang, MD;  Location: [facility];  Service: Cardiovascular;  Laterality: N/A;  . TEE WITHOUT CARDIOVERSION N/A 03/12/2016  Procedure: TRANSESOPHAGEAL ECHOCARDIOGRAM (TEE);  Surgeon: Sanda Klein, MD;  Location: [facility] ENDOSCOPY;  Service: Cardiovascular;  Laterality: N/A;        Home Medications    Prior to Admission medications   Medication Sig Start Date End Date Taking? Authorizing Provider  acetaminophen (TYLENOL) 325 MG tablet Take 650 mg by mouth every 6 (six) hours as needed (for pain or headaches).     [provider]  amLODipine (NORVASC) 10 MG tablet Take 1 tablet (10 mg total) by mouth daily. 12/14/17   Clent Demark, PA-C  aspirin EC 81 MG EC tablet Take 1 tablet (81 mg total) by mouth daily. 12/12/17   [facility], Tami Lin, PA  atorvastatin (LIPITOR) 80 MG tablet Take 1 tablet (80 mg total) by mouth daily. 12/11/17   [facility], Tami Lin, PA  carvedilol (COREG) 25 MG tablet Take 1 tablet (25 mg total) by mouth 2 (two) times daily with a meal.  12/11/17   [facility], Tami Lin, PA  cloNIDine (CATAPRES - DOSED IN MG/24 HR) 0.3 mg/24hr patch Place 1 patch (0.3 mg total) onto the skin once a week. Patient taking differently: Place 0.3 mg onto the skin every Friday.  08/23/17   Evans Lance, MD  clopidogrel (PLAVIX) 75 MG tablet Take 1 tablet (75 mg total) by mouth daily with breakfast. 12/12/17   [facility], Tami Lin, PA  furosemide (LASIX) 40 MG tablet Take one tablet daily as needed for shortness of breath or weight gain of more than 3 lbs in one day. 12/11/17   [facility], Tami Lin, PA  gabapentin (NEURONTIN) 300 MG capsule Take 2 capsules (600 mg total) by mouth 3 (three) times daily. 06/22/17   Clent Demark, PA-C  hydrALAZINE (APRESOLINE) 100 MG tablet Take 100 mg by mouth 2 (two) times daily.    [provider]  isosorbide mononitrate (IMDUR) 60 MG 24 hr tablet Take 1 tablet (60 mg total) by mouth daily. 03/25/18   Larey Dresser, MD  spironolactone (ALDACTONE) 25 MG tablet Take 1 tablet (25 mg total) by mouth daily. 03/25/18 06/23/18  Larey Dresser, MD    Family History Family History  Problem Relation Age of Onset  . Hypertension Mother   . Hypertension Unknown   . Colon cancer Paternal Uncle   . Stroke Maternal Aunt   . Heart attack Brother   . Diabetes Maternal Aunt   . Lung cancer Maternal Uncle   . Other Sister        "breathing machine at night"  . Headache Sister   . Thyroid disease Sister   . Throat cancer Neg Hx   . Pancreatic cancer Neg Hx   . Esophageal cancer Neg Hx   . Kidney disease Neg Hx   . Liver disease Neg Hx     Social History Social History   Tobacco Use  . Smoking status: Current Some Day Smoker    Packs/day: 0.25    Years: 23.00    Pack years: 5.75    Types: Cigarettes  . Smokeless tobacco: Never Used  . Tobacco comment: 3 to 4 per day  Substance Use Topics  . Alcohol use: Yes    Alcohol/week: 1.0 standard drinks    Types: 1 Cans of beer per week    Comment: occasionally  .  Drug use: Yes    Types: Marijuana    Comment: 2 times/week     Allergies   Imdur [isosorbide nitrate] and Tramadol   Review of Systems Review  of Systems  All other systems reviewed and are negative.    Physical Exam Updated Vital Signs BP (!) 142/88 (BP Location: Left Arm)   Pulse 68   Temp 98 F (36.7 C) (Oral)   Resp 14   SpO2 100%   Physical Exam Vitals signs and nursing note reviewed.  HENT:     Head: Normocephalic.  Musculoskeletal: Normal range of motion.     Comments: 2cm laceration right hand nv and ns intact  Skin:    General: Skin is warm.  Neurological:     General: No focal deficit present.     Mental Status: He is alert.  Psychiatric:        Mood and Affect: Mood normal.      ED Treatments / Results  Labs (all labs ordered are listed, but only abnormal results are displayed) Labs Reviewed - No data to display  EKG None  Radiology No results found.  Procedures .Marland KitchenLaceration Repair Date/Time: 03/30/2018 12:26 PM Performed by: Fransico Meadow, PA-C Authorized by: Fransico Meadow, PA-C   Consent:    Consent obtained:  Verbal   Consent given by:  Patient   Risks discussed:  Infection and poor wound healing   Alternatives discussed:  No treatment Anesthesia (see MAR for exact dosages):    Anesthesia method:  Local infiltration Laceration details:    Location:  Hand   Hand location:  R palm Repair type:    Repair type:  Simple Treatment:    Area cleansed with:  Betadine   Amount of cleaning:  Standard   Irrigation solution:  Sterile saline   Visualized foreign bodies/material removed: no   Skin repair:    Repair method:  Sutures   Suture size:  5-0   Suture material:  Prolene   Suture technique:  Simple interrupted   Number of sutures:  5 Approximation:    Approximation:  Close Post-procedure details:    Patient tolerance of procedure:  Tolerated well, no immediate complications   (including critical care time)  Medications  Ordered in ED Medications  lidocaine-EPINEPHrine (XYLOCAINE W/EPI) 2 %-1:200000 (PF) injection 10 mL (10 mLs Infiltration Given by Other 03/29/18 1829)  HYDROcodone-acetaminophen (NORCO/VICODIN) 5-325 MG per tablet 2 tablet (2 tablets Oral Given 03/29/18 1933)     Initial Impression / Assessment and Plan / ED Course  I have reviewed the triage vital signs and the nursing notes.  Pertinent labs & imaging results that were available during my care of the patient were reviewed by me and considered in my medical decision making (see chart for details).       Final Clinical Impressions(s) / ED Diagnoses   Final diagnoses:  Laceration of right hand without foreign body, initial encounter    ED Discharge Orders    None    Suture removal in 10 days.  Watch for any sign of infection   Sidney Ace 03/30/18 [place], [place], DO 03/31/18 [phone]

## 2018-03-31 ENCOUNTER — Emergency Department (HOSPITAL_COMMUNITY)
Admission: EM | Admit: 2018-03-31 | Discharge: 2018-04-01 | Disposition: A | Discharge status: Home / Self Care | Payer: Medicare HMO | Attending: Emergency Medicine | Admitting: Emergency Medicine

## 2018-03-31 DIAGNOSIS — I130 Hypertensive heart and chronic kidney disease with heart failure and stage 1 through stage 4 chronic kidney disease, or unspecified chronic kidney disease: Secondary | ICD-10-CM | POA: Insufficient documentation

## 2018-03-31 DIAGNOSIS — N183 Chronic kidney disease, stage 3 (moderate): Secondary | ICD-10-CM | POA: Insufficient documentation

## 2018-03-31 DIAGNOSIS — I25119 Atherosclerotic heart disease of native coronary artery with unspecified angina pectoris: Secondary | ICD-10-CM | POA: Insufficient documentation

## 2018-03-31 DIAGNOSIS — F17210 Nicotine dependence, cigarettes, uncomplicated: Secondary | ICD-10-CM | POA: Diagnosis not present

## 2018-03-31 DIAGNOSIS — Z952 Presence of prosthetic heart valve: Secondary | ICD-10-CM | POA: Insufficient documentation

## 2018-03-31 DIAGNOSIS — Z7982 Long term (current) use of aspirin: Secondary | ICD-10-CM | POA: Diagnosis not present

## 2018-03-31 DIAGNOSIS — T148XXA Other injury of unspecified body region, initial encounter: Secondary | ICD-10-CM

## 2018-03-31 DIAGNOSIS — L7622 Postprocedural hemorrhage and hematoma of skin and subcutaneous tissue following other procedure: Secondary | ICD-10-CM | POA: Diagnosis present

## 2018-03-31 DIAGNOSIS — I5042 Chronic combined systolic (congestive) and diastolic (congestive) heart failure: Secondary | ICD-10-CM | POA: Diagnosis not present

## 2018-03-31 DIAGNOSIS — Z79899 Other long term (current) drug therapy: Secondary | ICD-10-CM | POA: Insufficient documentation

## 2018-03-31 MED ORDER — OXYCODONE-ACETAMINOPHEN 5-325 MG PO TABS
1.0000 | ORAL_TABLET | Freq: Once | ORAL | Status: AC
  Administered 2018-03-31: 1 via ORAL
  Filled 2018-03-31: qty 1

## 2018-03-31 NOTE — ED Triage Notes (Signed)
Pt has stitches in a laceration in right hand that occurred here 2 nights ago. Pt is on plavix and his hand started bleeding 2 hours ago from suture site with mild pain. VSS. Bandage in place now and bleeding controlled.

## 2018-03-31 NOTE — ED Provider Notes (Signed)
[facility] EMERGENCY DEPARTMENT Provider Note   CSN: [id number] Arrival date & time: 03/31/18  1844    History   Chief Complaint Chief Complaint  Patient presents with  . Laceration    HPI [name] is a 48 y.o. male.   48 year old male with a history of aortic dissection, CAD, aortic disease status post valve replacement, CHF, CKD presents to the emergency department for evaluation of bleeding from his laceration site.  Presented to the ED 2 days ago for laceration to the right palm.  This was repaired with sutures.  Patient states that he began to notice bleeding from his wound when working on a car this afternoon.  He tried applying pressure with 2 towels, but was unable to stop the bleeding.  No medications taken prior to arrival.  He has not had any lightheadedness, syncope.  He is on chronic Plavix.  The history is provided by the patient. No language interpreter was used.  Laceration    Past Medical History:  Diagnosis Date  . Adenomatous colon polyp 08/2015  . Anxiety   . Aortic disease ([place])   . Aortic dissection (HCC)    a. admx 04/2014 >> L renal infarct; a/c renal failure >> b.  s/p Bioprosthetic Bent[place] and total arch replacement and staged endovascular repair of descending aortic aneurysm ([facility] - Dr. [name])  . CAD (coronary artery disease)    a. LHC 4/16:  oD1 60%  . Cardiomyopathy ([place])    a. non-ischemic - probably related to untreated HTN and ETOH abuse - Echo 3/13 with EF 35-40% >> b. Echo 4/16: Severe LVH, EF 55-60%, moderate AI, moderate MR, mild LAE, trivial effusion, known type B dissection with communication between true and false lumens with suprasternal images suggesting dissection plane may propagate to at least left subclavian takeoff, root above aortic valve okay    . Chronic abdominal pain   . Chronic combined systolic and diastolic congestive heart failure ([place])    a. 05/2011: Adm with pulm edema/HTN urgency, EF 35-40% with  diffuse hypokinesis and moderate to severe mitral regurgitation. Cardiomyopathy likely due to uncontrolled HTN and ETOH abuse - cath deferred due to renal insufficiency (felt due to uncontrolled HTN). bJodie Echevaria MV 06/2011: EF 37% and no ischemia or infarction. c. EF 45-50% by echo 01/2012.  [name] Chronic sinusitis   . CKD (chronic kidney disease)    a. 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  peak creatinine 3.46 during admx for aortic dissection 2/16.  sees Dr [name]  . Colon polyps 09/11/2015   Descending, sigmoid polyps  . DDD (degenerative disc disease), lumbar   . Depression   . Descending thoracic aortic aneurysm ([place])   . Dissecting aneurysm of thoracic aorta ([place])   . ETOH abuse    a. Reported to have quit 05/2011.  . Frequent headaches   . GERD (gastroesophageal reflux disease)   . Headache(784.0)    "q other day" (08/08/2013)  . Heart murmur   . Hemorrhoid thrombosis   . History of echocardiogram    Echo 1/17:  Severe LVH, EF 55-60%, no RWMA, Gr 2 DD, AVR ok, mild to mod MR, mild LAE, mild reduced RVSF, mod RAE  . History of medication noncompliance   . HYPERLIPIDEMIA   . Hypertension    a. Hx of HTN urgency secondary to noncompliance. b. urinary metanephrine and catecholeamine levels normal 2013.  c. Renal art [name] 1/16:  No evidence of renal artery stenosis noted bilaterally.  [name]  INGUINAL HERNIA   . Pneumonia ~ 2013  . Renal insufficiency   . Stroke ([place])   . Tobacco abuse   . Valvular heart disease    a. Echo 05/2011: moderate to severe eccentric MR and mild to moderate AI with prolapsing left coronary cusp. b. Echo 01/2012: mild-mod AI, mild dilitation of aortic root, mild MR.;  c. Echo 1/16: Severe LVH consistent with hypertrophic cardio myopathy, EF 50%, no RWMA, mod AI, mild MR, mild RAE, dilated Ao root (40 mm);     Patient Active Problem List   Diagnosis Date Noted  . Acute on chronic combined systolic and diastolic CHF (congestive heart failure) ([place]) 12/08/2017  . Coronary  artery disease involving native coronary artery of native heart with angina pectoris ([place]) 12/06/2017  . NSTEMI (non-ST elevated myocardial infarction) ([place]) 12/02/2017  . Hypertensive emergency - Accelerated Hypertension 12/02/2017  . History of stroke 10/05/2017  . Somnolence, daytime 04/07/2017  . Snoring 04/07/2017  . Cerebral infarction due to embolism of left middle cerebral artery ([place]) 06/03/2016  . History of aortic dissection 06/03/2016  . Palpitation   . Cerebrovascular accident (CVA) due to embolism of left middle cerebral artery ([place])   . Seizures ([place]) 02/28/2016  . HLD (hyperlipidemia) 08/28/2015  . 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[place]) 06/07/2015  . Cardiomyopathy ([place]) 03/01/2015  . Acid reflux 03/01/2015  . Essential hypertension 03/01/2015  . S/P aortic dissection repair 10/03/2014  . H/O aortic valve replacement 10/03/2014  . CKD (chronic kidney disease) 05/15/2014  . AKI (acute kidney injury) ([place])   . Dissecting aneurysm of thoracic aorta, Stanford type B ([place]) 04/24/2014  . Aortic dissection ([place]) 04/23/2014  . Aneurysm of ascending aorta (HCC) 03/28/2014  . Dyspnea 03/27/2014  . ETOH abuse 09/20/2013  . Malignant hypertension 02/20/2013  . Hypertensive urgency 01/27/2012  . Cardiomyopathy, hypertensive ([place]) 01/26/2012  . AI (aortic insufficiency) 01/26/2012  . MR (mitral regurgitation) 01/26/2012  . Acute renal failure superimposed on stage 3 chronic kidney disease ([place]) 01/26/2012  . Hypertensive cardiomyopathy, without heart failure ([place]): EF reduced to 35-40% from 55%. 06/10/2011  . Chronic bronchitis 05/23/2011  . Cardiomegaly - hypertensive 05/22/2011  . Marijuana use 05/22/2011  . Abdominal pain 05/22/2011  . Hypertension, malignant 05/22/2011  . Anxiety and depression 05/22/2011  . Hyperlipidemia LDL goal <70 08/26/2009  . Tobacco use 08/26/2009  . Headache(784.0) 08/26/2009  . Aortic valve disorder 03/11/2009  . INGUINAL HERNIA  02/18/2009  . Uncontrolled hypertension 01/16/2009    Past Surgical History:  Procedure Laterality Date  . ANKLE SURGERY Bilateral    Fractures bilaterally  . AORTIC VALVE SURGERY  09/2014  . CORONARY ANGIOGRAPHY N/A 12/06/2017   Procedure: CORONARY ANGIOGRAPHY;  Surgeon: Belva Crome, MD;  Location: [facility];  Service: Cardiovascular;  Laterality: N/A;  . CORONARY STENT INTERVENTION N/A 12/10/2017   Procedure: CORONARY STENT INTERVENTION;  Surgeon: [name] Sine, MD;  Location: [facility];  Service: Cardiovascular;  Laterality: N/A;  . FOOT FRACTURE SURGERY Bilateral 2004-2010   "got pins in both of them"  . HEMORRHOID SURGERY N/A 06/15/2015   Procedure: HEMORRHOIDECTOMY;  Surgeon: Stark Klein, MD;  Location: [facility];  Service: General;  Laterality: N/A;  . INGUINAL HERNIA REPAIR Right ~ 1996  . LEFT HEART CATHETERIZATION WITH CORONARY ANGIOGRAM N/A 06/21/2014   Procedure: LEFT HEART CATHETERIZATION WITH CORONARY ANGIOGRAM;  Surgeon: Larey Dresser, MD;  Location[facility] CATH LAB;  Service: Cardiovascular;  Laterality: N/A;  . LOOP RECORDER INSERTION  N/A 06/19/2016   Procedure: Loop Recorder Insertion;  Surgeon: Deboraha Sprang, MD;  Location: [facility];  Service: Cardiovascular;  Laterality: N/A;  . TEE WITHOUT CARDIOVERSION N/A 03/12/2016   Procedure: TRANSESOPHAGEAL ECHOCARDIOGRAM (TEE);  Surgeon: Sanda Klein, MD;  Location: [facility] ENDOSCOPY;  Service: Cardiovascular;  Laterality: N/A;        Home Medications    Prior to Admission medications   Medication Sig Start Date End Date Taking? Authorizing Provider  acetaminophen (TYLENOL) 325 MG tablet Take 650 mg by mouth every 6 (six) hours as needed (for pain or headaches).     [provider]  amLODipine (NORVASC) 10 MG tablet Take 1 tablet (10 mg total) by mouth daily. 12/14/17   Clent Demark, PA-C  aspirin EC 81 MG EC tablet Take 1 tablet (81 mg total) by mouth daily. 12/12/17   [facility], Tami Lin,  PA  atorvastatin (LIPITOR) 80 MG tablet Take 1 tablet (80 mg total) by mouth daily. 12/11/17   [facility], Tami Lin, PA  carvedilol (COREG) 25 MG tablet Take 1 tablet (25 mg total) by mouth 2 (two) times daily with a meal. 12/11/17   [facility], Tami Lin, PA  cloNIDine (CATAPRES - DOSED IN MG/24 HR) 0.3 mg/24hr patch Place 1 patch (0.3 mg total) onto the skin once a week. Patient taking differently: Place 0.3 mg onto the skin every Friday.  08/23/17   Evans Lance, MD  clopidogrel (PLAVIX) 75 MG tablet Take 1 tablet (75 mg total) by mouth daily with breakfast. 12/12/17   [facility], Tami Lin, PA  furosemide (LASIX) 40 MG tablet Take one tablet daily as needed for shortness of breath or weight gain of more than 3 lbs in one day. 12/11/17   [facility], Tami Lin, PA  gabapentin (NEURONTIN) 300 MG capsule Take 2 capsules (600 mg total) by mouth 3 (three) times daily. 06/22/17   Clent Demark, PA-C  hydrALAZINE (APRESOLINE) 100 MG tablet Take 100 mg by mouth 2 (two) times daily.    [provider]  isosorbide mononitrate (IMDUR) 60 MG 24 hr tablet Take 1 tablet (60 mg total) by mouth daily. 03/25/18   Larey Dresser, MD  spironolactone (ALDACTONE) 25 MG tablet Take 1 tablet (25 mg total) by mouth daily. 03/25/18 06/23/18  Larey Dresser, MD    Family History Family History  Problem Relation Age of Onset  . Hypertension Mother   . Hypertension Unknown   . Colon cancer Paternal Uncle   . Stroke Maternal Aunt   . Heart attack Brother   . Diabetes Maternal Aunt   . Lung cancer Maternal Uncle   . Other Sister        "breathing machine at night"  . Headache Sister   . Thyroid disease Sister   . Throat cancer Neg Hx   . Pancreatic cancer Neg Hx   . Esophageal cancer Neg Hx   . Kidney disease Neg Hx   . Liver disease Neg Hx     Social History Social History   Tobacco Use  . Smoking status: Current Some Day Smoker    Packs/day: 0.25    Years: 23.00    Pack years: 5.75    Types:  Cigarettes  . Smokeless tobacco: Never Used  . Tobacco comment: 3 to 4 per day  Substance Use Topics  . Alcohol use: Yes    Alcohol/week: 1.0 standard drinks    Types: 1 Cans of beer per week    Comment:  occasionally  . Drug use: Yes    Types: Marijuana    Comment: 2 times/week     Allergies   Imdur [isosorbide nitrate] and Tramadol   Review of Systems Review of Systems Ten systems reviewed and are negative for acute change, except as noted in the HPI.    Physical Exam Updated Vital Signs BP 137/80 (BP Location: Left Arm)   Pulse 77   Temp 97.9 F (36.6 C) (Oral)   Resp 18   SpO2 97%   Physical Exam Vitals signs and nursing note reviewed.  Constitutional:      General: He is not in acute distress.    Appearance: He is well-developed. He is not diaphoretic.     Comments: Nontoxic-appearing and in no distress  HENT:     Head: Normocephalic and atraumatic.  Eyes:     General: No scleral icterus.    Conjunctiva/sclera: Conjunctivae normal.  Neck:     Musculoskeletal: Normal range of motion.  Cardiovascular:     Rate and Rhythm: Normal rate and regular rhythm.     Pulses: Normal pulses.     Comments: Distal radial pulse 2+ in the right upper extremity Pulmonary:     Effort: Pulmonary effort is normal. No respiratory distress.     Comments: Respirations even and unlabored Musculoskeletal: Normal range of motion.     Comments: Laceration to the palm of the right hand with brisk bleeding.  No erythema, induration, heat to touch.  No purulence.  Skin:    General: Skin is warm and dry.     Coloration: Skin is not pale.     Findings: No erythema or rash.  Neurological:     Mental Status: He is alert and oriented to person, place, and time.  Psychiatric:        Behavior: Behavior normal.      ED Treatments / Results  Labs (all labs ordered are listed, but only abnormal results are displayed) Labs Reviewed - No data to display  EKG None  Radiology No  results found.  Procedures Procedures (including critical care time)  Medications Ordered in ED Medications  oxyCODONE-acetaminophen (PERCOCET/ROXICET) 5-325 MG per tablet 1 tablet (1 tablet Oral Given 03/31/18 2300)     Initial Impression / Assessment and Plan / ED Course  I have reviewed the triage vital signs and the nursing notes.  Pertinent labs & imaging results that were available during my care of the patient were reviewed by me and considered in my medical decision making (see chart for details).     10:40 PM Patient presenting for bleeding from site of laceration which was repaired 2 days ago.  Patient neurovascularly intact.  Constant, direct pressure was applied to the area of bleeding.  This was held for at least 10 minutes.  On examination of the laceration site, bleeding has since subsided.  We will continue to monitor to ensure no repeat bleed.  11:06 PM Patient with some mild repeat bleeding.  The cap of a saline lock was applied over top of the bleeding site and secured with coband to try and apply direct, consistent pressure.  Will reassess.  11:49 PM No repeat bleeding.  Dressing clean, dry, intact.  Will keep dressing in place.  Patient to be discharged with instruction to return for worsening symptoms.   Final Clinical Impressions(s) / ED Diagnoses   Final diagnoses:  Bleeding from wound    ED Discharge Orders    None  Antonietta Breach, PA-C 04/01/18 0003    Lajean Saver, MD 04/07/18 1410

## 2018-04-05 ENCOUNTER — Other Ambulatory Visit: Payer: Self-pay | Admitting: Family Medicine

## 2018-04-05 ENCOUNTER — Ambulatory Visit (HOSPITAL_COMMUNITY)
Admission: RE | Admit: 2018-04-05 | Discharge: 2018-04-05 | Disposition: A | Discharge status: Home / Self Care | Payer: Medicare HMO | Source: Ambulatory Visit | Attending: Cardiology | Admitting: Cardiology

## 2018-04-05 DIAGNOSIS — M792 Neuralgia and neuritis, unspecified: Secondary | ICD-10-CM

## 2018-04-05 DIAGNOSIS — I509 Heart failure, unspecified: Secondary | ICD-10-CM

## 2018-04-05 MED ORDER — TECHNETIUM TC 99M PYROPHOSPHATE
21.0000 | Freq: Once | INTRAVENOUS | Status: AC
  Administered 2018-04-05: 21 via INTRAVENOUS
  Filled 2018-04-05: qty 21

## 2018-04-05 MED ORDER — GABAPENTIN 300 MG PO CAPS: 600.0000 mg | ORAL_CAPSULE | Freq: Three times a day (TID) | ORAL | 0 refills | Status: DC

## 2018-04-05 NOTE — Progress Notes (Signed)
Patient ID: [name], male   DOB: 1970/04/01, 48 y.o.   MRN: [id number]   Refill request received from [facility] on [facility] for refill of gabapentin.  Patient has not been seen in the office since 06/22/2017.  30-day supply will be sent to patient's pharmacy and patient will need to make a follow-up appointment prior to future refills.

## 2018-04-08 ENCOUNTER — Other Ambulatory Visit (HOSPITAL_COMMUNITY): Payer: Medicare Other

## 2018-04-14 ENCOUNTER — Ambulatory Visit (INDEPENDENT_AMBULATORY_CARE_PROVIDER_SITE_OTHER): Payer: Medicare HMO

## 2018-04-14 DIAGNOSIS — I639 Cerebral infarction, unspecified: Secondary | ICD-10-CM

## 2018-04-15 LAB — CUP PACEART REMOTE DEVICE CHECK
MDC IDC PG IMPLANT DT: 20180406
MDC IDC SESS DTM: 20200130134206

## 2018-04-21 NOTE — Progress Notes (Signed)
[facility] Summary Report / Loop Recorder 

## 2018-04-25 ENCOUNTER — Encounter (HOSPITAL_COMMUNITY): Payer: Self-pay

## 2018-04-25 ENCOUNTER — Other Ambulatory Visit: Payer: Self-pay

## 2018-04-25 ENCOUNTER — Telehealth (HOSPITAL_COMMUNITY): Payer: Self-pay | Admitting: Cardiology

## 2018-04-25 ENCOUNTER — Ambulatory Visit (HOSPITAL_COMMUNITY)
Admission: RE | Admit: 2018-04-25 | Discharge: 2018-04-25 | Disposition: A | Discharge status: Home / Self Care | Payer: Medicare HMO | Source: Ambulatory Visit | Attending: Cardiology | Admitting: Cardiology

## 2018-04-25 VITALS — BP 150/92 | HR 75 | Wt 150.5 lb

## 2018-04-25 DIAGNOSIS — I2510 Atherosclerotic heart disease of native coronary artery without angina pectoris: Secondary | ICD-10-CM | POA: Insufficient documentation

## 2018-04-25 DIAGNOSIS — N183 Chronic kidney disease, stage 3 (moderate): Secondary | ICD-10-CM | POA: Insufficient documentation

## 2018-04-25 DIAGNOSIS — I10 Essential (primary) hypertension: Secondary | ICD-10-CM | POA: Diagnosis not present

## 2018-04-25 DIAGNOSIS — Z953 Presence of xenogenic heart valve: Secondary | ICD-10-CM | POA: Insufficient documentation

## 2018-04-25 DIAGNOSIS — Z8249 Family history of ischemic heart disease and other diseases of the circulatory system: Secondary | ICD-10-CM | POA: Diagnosis not present

## 2018-04-25 DIAGNOSIS — Z801 Family history of malignant neoplasm of trachea, bronchus and lung: Secondary | ICD-10-CM | POA: Insufficient documentation

## 2018-04-25 DIAGNOSIS — I712 Thoracic aortic aneurysm, without rupture: Secondary | ICD-10-CM | POA: Insufficient documentation

## 2018-04-25 DIAGNOSIS — Z7902 Long term (current) use of antithrombotics/antiplatelets: Secondary | ICD-10-CM | POA: Diagnosis not present

## 2018-04-25 DIAGNOSIS — F17210 Nicotine dependence, cigarettes, uncomplicated: Secondary | ICD-10-CM | POA: Insufficient documentation

## 2018-04-25 DIAGNOSIS — Z79899 Other long term (current) drug therapy: Secondary | ICD-10-CM | POA: Diagnosis not present

## 2018-04-25 DIAGNOSIS — I43 Cardiomyopathy in diseases classified elsewhere: Secondary | ICD-10-CM

## 2018-04-25 DIAGNOSIS — I252 Old myocardial infarction: Secondary | ICD-10-CM | POA: Diagnosis not present

## 2018-04-25 DIAGNOSIS — I25119 Atherosclerotic heart disease of native coronary artery with unspecified angina pectoris: Secondary | ICD-10-CM | POA: Diagnosis not present

## 2018-04-25 DIAGNOSIS — I130 Hypertensive heart and chronic kidney disease with heart failure and stage 1 through stage 4 chronic kidney disease, or unspecified chronic kidney disease: Secondary | ICD-10-CM | POA: Insufficient documentation

## 2018-04-25 DIAGNOSIS — Z888 Allergy status to other drugs, medicaments and biological substances status: Secondary | ICD-10-CM | POA: Insufficient documentation

## 2018-04-25 DIAGNOSIS — E785 Hyperlipidemia, unspecified: Secondary | ICD-10-CM

## 2018-04-25 DIAGNOSIS — Z7982 Long term (current) use of aspirin: Secondary | ICD-10-CM | POA: Diagnosis not present

## 2018-04-25 DIAGNOSIS — I119 Hypertensive heart disease without heart failure: Secondary | ICD-10-CM

## 2018-04-25 DIAGNOSIS — I5022 Chronic systolic (congestive) heart failure: Secondary | ICD-10-CM | POA: Diagnosis not present

## 2018-04-25 DIAGNOSIS — Z800 Family history of malignant neoplasm of digestive organs: Secondary | ICD-10-CM | POA: Insufficient documentation

## 2018-04-25 DIAGNOSIS — Z885 Allergy status to narcotic agent status: Secondary | ICD-10-CM | POA: Diagnosis not present

## 2018-04-25 DIAGNOSIS — I739 Peripheral vascular disease, unspecified: Secondary | ICD-10-CM | POA: Insufficient documentation

## 2018-04-25 DIAGNOSIS — Z833 Family history of diabetes mellitus: Secondary | ICD-10-CM | POA: Diagnosis not present

## 2018-04-25 DIAGNOSIS — I429 Cardiomyopathy, unspecified: Secondary | ICD-10-CM | POA: Insufficient documentation

## 2018-04-25 LAB — BASIC METABOLIC PANEL
ANION GAP: 7 (ref 5–15)
BUN: 14 mg/dL (ref 6–20)
CALCIUM: 8.9 mg/dL (ref 8.9–10.3)
CO2: 21 mmol/L — AB (ref 22–32)
Chloride: 114 mmol/L — ABNORMAL HIGH (ref 98–111)
Creatinine, Ser: 1.75 mg/dL — ABNORMAL HIGH (ref 0.61–1.24)
GFR calc Af Amer: 53 mL/min — ABNORMAL LOW (ref >60)
GFR calc non Af Amer: 46 mL/min — ABNORMAL LOW (ref >60)
GLUCOSE: 92 mg/dL (ref 70–99)
Potassium: 4.2 mmol/L (ref 3.5–5.1)
Sodium: 142 mmol/L (ref 135–145)

## 2018-04-25 LAB — LIPID PANEL
CHOL/HDL RATIO: 5.5 ratio
Cholesterol: 181 mg/dL (ref 0–200)
HDL: 33 mg/dL — AB (ref >40)
LDL CALC: 128 mg/dL — AB (ref 0–99)
Triglycerides: 98 mg/dL (ref <150)
VLDL: 20 mg/dL (ref 0–40)

## 2018-04-25 NOTE — Telephone Encounter (Signed)
Notes recorded by Kerry Dory, CMA on 04/25/2018 at 2:58 PM EST Pt aware via wife and referral placed ------  Notes recorded by Shirley Friar, PA-C on 04/25/2018 at 10:36 AM EST Refer to lipid clinic.     [name] [address] [place], PA-C 04/25/2018 10:36 AM

## 2018-04-25 NOTE — Telephone Encounter (Signed)
-----   Message from Shirley Friar, PA-C sent at 04/25/2018 10:36 AM EST ----- Refer to lipid clinic.     Legrand Como [address] [place], PA-C 04/25/2018 10:36 AM

## 2018-04-25 NOTE — Patient Instructions (Signed)
Routine lab work today. Will notify you of abnormal results  Follow up with Dr.McLean in 3 months.  

## 2018-04-25 NOTE — Progress Notes (Signed)
Advanced Heart Failure Clinic Note   Referring Physician: Almyra Deforest PA-C Primary Care: Domenica Fail PA-C  Primary Cardiologist: Dr Aundra Dubin  Nephrology: Dr [name] [facility]: Dr [name]  [facility] Surgeon  HPI: [name] is a 48 y.o. male with a history of CAD, NSTEMI 11/2017,  smoker, CKD Stage III, HTN, AAA 2016 at [facility] , open repair of TAAA secondary to chronic dissection with multibranch dacron  graft 12/2016, CVA 2017, loop recorder, bioprothetic AVR 2016, HLD.   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  The descending thoracic aorta has dilated to 5.1 cm.  Cardiac catheterization in April 2016 demonstrated 60% first diagonal lesion.  He underwent aortic valve replacement with bovine pericardial tissue valve and reconstruction along with aortic root replacement and endovascular repair of the descending thoracic aortic aneurysm at [facility].  He has had intermittent chest discomfort since then.  Admitted in 12/02/2017 chest pain. Had NSTEMI. He had LHC with stent placed proximal ramus. He underwent staged PCI of of ramus intermedius. Continuew asa 81 mg daily + plavix.    He presents today for regular follow up. Last visit spiro added and imdur added back at decreased dose due to HA.  He did not tolerate being back on imdur. He is feeling well overall. Has his right-sided chest pain that is chronic. No changes. Does not worsen with exertion. Has chronic abdominal pain at his prior surgical site. He has intermittent ache in his right groin from a previous stent site. Will ache for about a week twice a year. Hasn't followed up with [facility] (where stents were placed) since last may. Denies claudication. He states he hasn't been SOB for 2 months. Cutting back cigarettes, down to 1 every other day. He is taking all of his other medications as directed. Report general  fatigue that is on-going.   ECG: NSR, LVH with repolarization abnormality (personally reviewed).   PYP scan 04/05/18 Grade 1, H/CL 1.1 -> NOT suggestive of TTR amyloidosis.   Review of systems complete and found to be negative unless listed in HPI.    PMH: 1. H/o ankle fractures bilaterally 2. GERD 3. HTN: Negative urinary catecholeamine collection for pheochromocytoma.  4. CKD: Stage 3. Suspect hypertensive nephropathy.  5. Cardiomyopathy: Most likely due to heavy ETOH ingestion and uncontrolled HTN.   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  Lexiscan myoview (4/13): EF 37%, global hypokinesis, no ischemic or infarction.  Echo (12/14) with EF 45-50%, diffuse hypokinesis, moderate LVH, mild MR, moderate AI.  Echo (4/16) with EF 55-60%, severe LVH, mild LV dilation, moderate AI and moderate MR.   - Echo (9/19): EF 35-40% with diffuse hypokinesis, severe LVH, bioprosthetic aortic valve functioning normally.  6. ETOH abuse: Quit 3/13.  7. Active smoker. 8. Valvular heart disease: Echo in 3/13 showed moderate to severe eccentric MR and mild to moderate AI with prolapsing left coronary cusp. Echo 12/14 with mild MR and moderate AI.  Echo 4/16 with moderate AI and moderate MR.  - S/p Bentall procedure with bioprosthetic aortic valve 9/16.  9.  CVA: Has ILR. 10. Type B aortic dissection: Occurred in 2/16, from just distal to the left subclavian.  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    -  10/03/2014 First stage median sternotomy with right axillary cannulation for aortic valved conduit root replacement with coronary reconstruction (button Bentall Procedure), 27 mm Sorin Mitroflow bovine pericardial valved conduit, total arch replacement (61mm Vascutek [place] multibranch arch graft with individual head vessel reimplantation. - Status post open repair of  an Extent III TAA secondary to chronic dissection with a # 60mm Vascutek "Coselli" multibranch Dacron graft with individual celiac, SMA, and bilateral renal artery implantationon 01/04/2017, [facility].  11. CAD: LHC (4/16) with 60% ostial D1.   - NSTEMI 9/19: LHC (9/19) showed 50% proximal LCx, 90% ramus => DES to ramus.   Current Outpatient Medications  Medication Sig Dispense Refill  . acetaminophen (TYLENOL) 325 MG tablet Take 650 mg by mouth every 6 (six) hours as needed (for pain or headaches).     [name] amLODipine (NORVASC) 10 MG tablet Take 1 tablet (10 mg total) by mouth daily. 30 tablet 5  . aspirin EC 81 MG EC tablet Take 1 tablet (81 mg total) by mouth daily. 90 tablet 3  . atorvastatin (LIPITOR) 80 MG tablet Take 1 tablet (80 mg total) by mouth daily. 90 tablet 3  . carvedilol (COREG) 25 MG tablet Take 1 tablet (25 mg total) by mouth 2 (two) times daily with a meal. 180 tablet 3  . cloNIDine (CATAPRES - DOSED IN MG/24 HR) 0.3 mg/24hr patch Place 1 patch (0.3 mg total) onto the skin once a week. 4 patch 6  . clopidogrel (PLAVIX) 75 MG tablet Take 1 tablet (75 mg total) by mouth daily with breakfast. 90 tablet 3  . furosemide (LASIX) 40 MG tablet Take one tablet daily as needed for shortness of breath or weight gain of more than 3 lbs in one day. 30 tablet 1  . gabapentin (NEURONTIN) 300 MG capsule Take 2 capsules (600 mg total) by mouth 3 (three) times daily. Needs office visit 90 capsule 0  . hydrALAZINE (APRESOLINE) 100 MG tablet Take 100 mg by mouth 2 (two) times daily.    [name] spironolactone (ALDACTONE) 25 MG tablet Take 1 tablet (25 mg total) by mouth daily. 90 tablet 3   No current facility-administered medications for this encounter.    Allergies  Allergen Reactions  . Imdur [Isosorbide Nitrate] Other (See Comments)    Headaches   . Tramadol Other (See Comments)    Headaches     Social History   Socioeconomic History  . Marital status: Married    Spouse name: Not on file  .  Number of children: 5  . Years of education: 74  . Highest education level: Not on file  Occupational History  . Occupation: Disabled, part-time Programmer, systems    Comment: Disability  Social Needs  . Financial resource strain: Not hard at all  . Food insecurity:    Worry: Never true    Inability: Never true  . Transportation needs:    Medical: No    Non-medical: No  Tobacco Use  . Smoking status: Current Some Day Smoker    Packs/day: 0.25    Years: 23.00    Pack years: 5.75    Types: Cigarettes  . Smokeless tobacco: Never Used  . Tobacco comment: 3 to 4 per day  Substance and Sexual Activity  . Alcohol use: Yes    Alcohol/week: 1.0 standard drinks    Types: 1 Cans of beer per week    Comment: occasionally  . Drug use: Yes    Types: Marijuana    Comment: 2 times/week  .  Sexual activity: Yes  Lifestyle  . Physical activity:    Days per week: Not on file    Minutes per session: Not on file  . Stress: Not on file  Relationships  . Social connections:    Talks on phone: Not on file    Gets together: Not on file    Attends religious service: Not on file    Active member of club or organization: Not on file    Attends meetings of clubs or organizations: Not on file    Relationship status: Not on file  . Intimate partner violence:    Fear of current or ex partner: Not on file    Emotionally abused: Not on file    Physically abused: Not on file    Forced sexual activity: Not on file  Other Topics Concern  . Not on file  Social History Narrative   Fun: Enjoy his children   Family History  Problem Relation Age of Onset  . Hypertension Mother   . Hypertension Unknown   . Colon cancer Paternal Uncle   . Stroke Maternal Aunt   . Heart attack Brother   . Diabetes Maternal Aunt   . Lung cancer Maternal Uncle   . Other Sister        "breathing machine at night"  . Headache Sister   . Thyroid disease Sister   . Throat cancer Neg Hx   . Pancreatic cancer Neg Hx   .  Esophageal cancer Neg Hx   . Kidney disease Neg Hx   . Liver disease Neg Hx    Vitals:   04/25/18 0827  BP: (!) 150/92  Pulse: 75  SpO2: 99%  Weight: 68.3 kg (150 lb 8 oz)    Wt Readings from Last 3 Encounters:  04/25/18 68.3 kg (150 lb 8 oz)  03/25/18 68.8 kg (151 lb 9.6 oz)  01/06/18 71.8 kg (158 lb 6.4 oz)   PHYSICAL EXAM: General: Well appearing. No resp difficulty. HEENT: Normal Neck: Supple. JVP 5-6. Carotids 2+ bilat; no bruits. No thyromegaly or nodule noted. Cor: PMI nondisplaced. RRR, 3/6 early SEM RUSB, crisp S2. Lungs: CTAB, normal effort. Abdomen: Soft, non-tender, non-distended, no HSM. No bruits or masses. +BS  Extremities: No cyanosis, clubbing, or rash. R and LLE no edema.  Neuro: Alert & orientedx3, cranial nerves grossly intact. moves all 4 extremities w/o difficulty. Affect pleasant   ASSESSMENT & PLAN: 1. CAD: 9/19 NSTEMI with PCI to proximal ramus.  - No s/s of ischemia.    - Continue ASA 81, Plavix.  - He is on atorvastatin 80 daily. Repeat lipids today.  2. Chronic Systolic Heart Failure: Echo in 9/19 with EF 35-40% with severe LVH and concern for infiltrative process. Myeloma panel negative, suspect due to HTN.  He is not volume overloaded on exam and is not using Lasix.  Cannot get cardiac MRI with elevated creatinine.  - PYP scan negative for for TTR amyloidosis.  - Continue Coreg 25 mg bid.  - Continue hydralazine 100 mg BID - Did not tolerate lower doses of Imdur with HAs.  - Continue spironolactone 25 mg daily. BMET today.  3. HTN: - Meds as above. Slightly elevated on arrival, but just took his medications.  - Continue amlodipine 10 mg daily - Continue catapres 0.3 patch once weekly. 4. S/P Bioprothetic AVR: Valve was stable on 9/19 echo.  - No change.  5. S/P 2016 and 2018 thoracic aortic aneurysm repair: Followed by Dr. [name] at [facility].  -  No change.  6. CKD Stage III:  - BMET today.  7. Active smoker:  - Encouraged complete cessation.   - He has been ordered for PFTs to see if COPD is contributing to his dyspnea as he does not appear volume overloaded.  8. PAD - Intermittent aching in groin. No discharge or erythema, chronic problem - No claudication - Offered follow up with vascular here, but he prefers to keep follow up with [facility]. Encouraged to call.   BMET and Lipids today. Will make sure he is taking spiro 25 mg daily, as it was ordered for 12.5.   Shirley Friar, PA-C  04/25/2018   Greater than 50% of the 25 minute visit was spent in counseling/coordination of care regarding disease state education, salt/fluid restriction, sliding scale diuretics, and medication compliance.

## 2018-05-05 ENCOUNTER — Ambulatory Visit (INDEPENDENT_AMBULATORY_CARE_PROVIDER_SITE_OTHER): Payer: Medicare HMO | Admitting: Pharmacist Clinician (PhC)/ Clinical Pharmacy Specialist

## 2018-05-05 DIAGNOSIS — E785 Hyperlipidemia, unspecified: Secondary | ICD-10-CM | POA: Diagnosis not present

## 2018-05-05 IMAGING — CT CT ANGIO CHEST-ABD-PELV FOR DISSECTION W/ AND WO/W CM
2 of 10 series · 11 of 46 positions shown, 15 images · IV contrast (Omni 300)
Comparison: Multiple prior CTA examinations with the latest on
03/21/2015.

CLINICAL DATA: Right upper chest pain, back pain and left lower
quadrant abdominal pain. History of TEVAR to treat aortic dissection
with known chronic abdominal aortic dissection. Hypertension.

EXAM:
CT ANGIOGRAPHY CHEST, ABDOMEN AND PELVIS
TECHNIQUE: Multidetector CT imaging through the chest, abdomen and pelvis was
performed using the standard protocol during bolus administration of
intravenous contrast. Multiplanar reconstructed images and MIPs were
obtained and reviewed to evaluate the vascular anatomy.
CONTRAST:  100 mL Isovue 370 IV

[Series 5: arterial · axial · arterial · 0.71mm/px · z∈[-698,-146]mm · 9 of 222 slices shown, 13 images]
[im 19/222  soft-tissue]
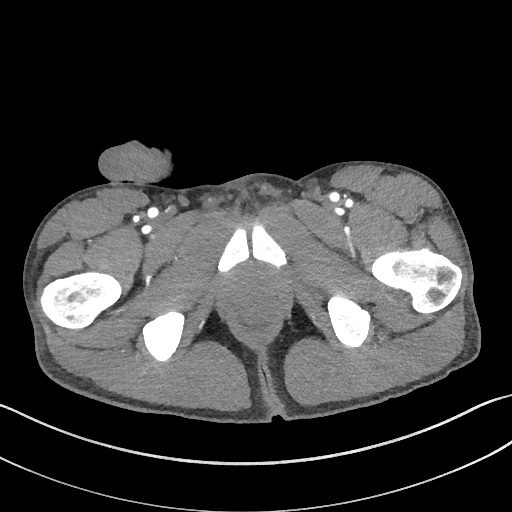
[im 19/222  bone]
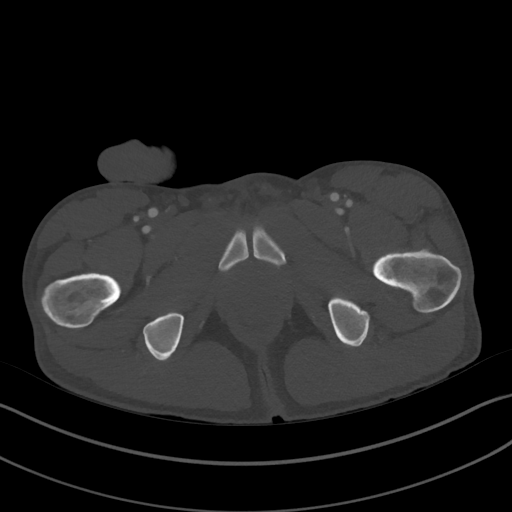
[im 56/222  soft-tissue]
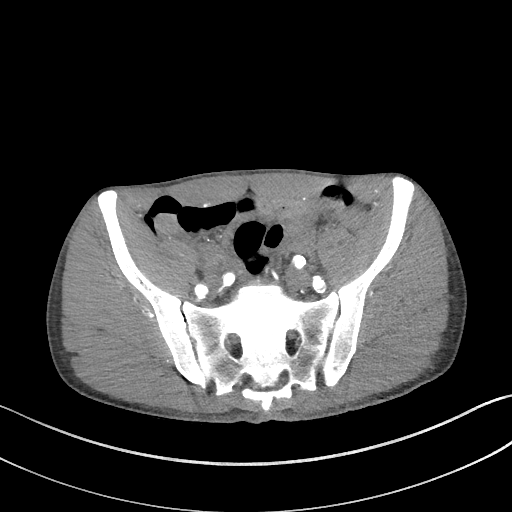
[im 74/222  soft-tissue]
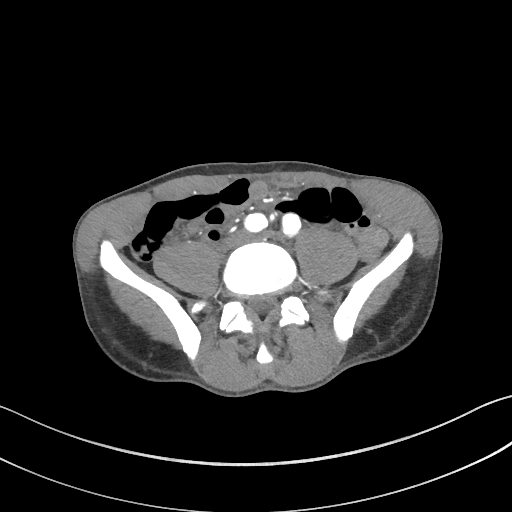
[im 93/222  soft-tissue]
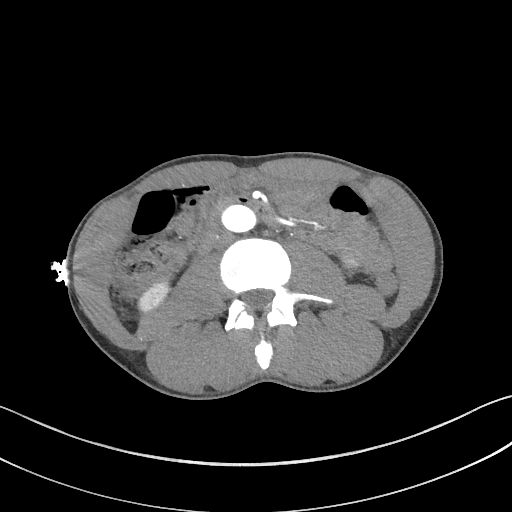
[im 129/222  soft-tissue]
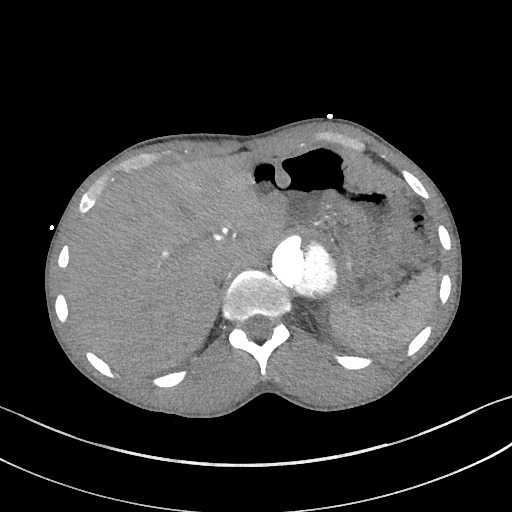
[im 148/222  soft-tissue]
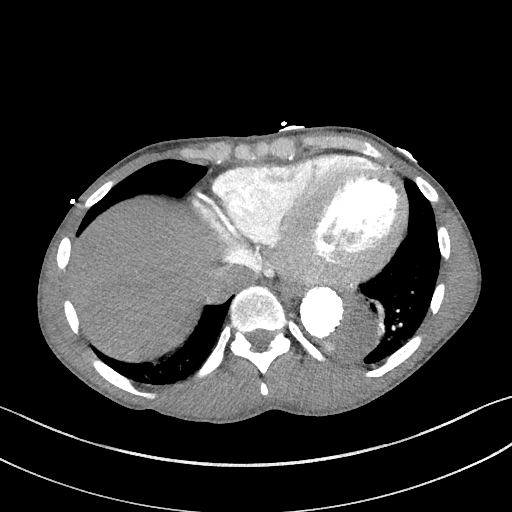
[im 148/222  lung]
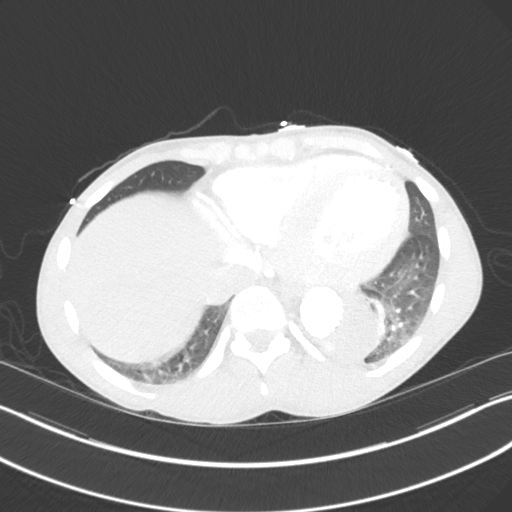
[im 166/222  soft-tissue]
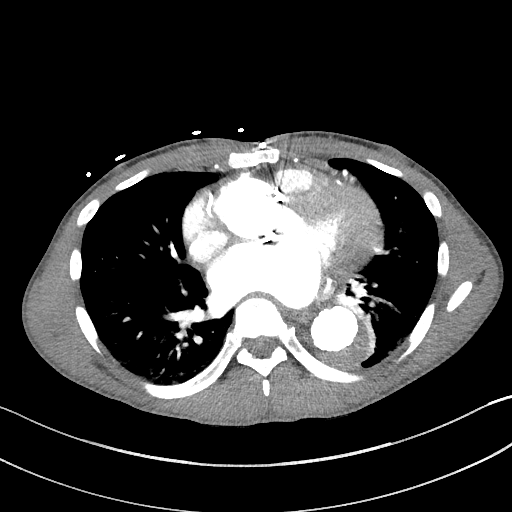
[im 166/222  lung]
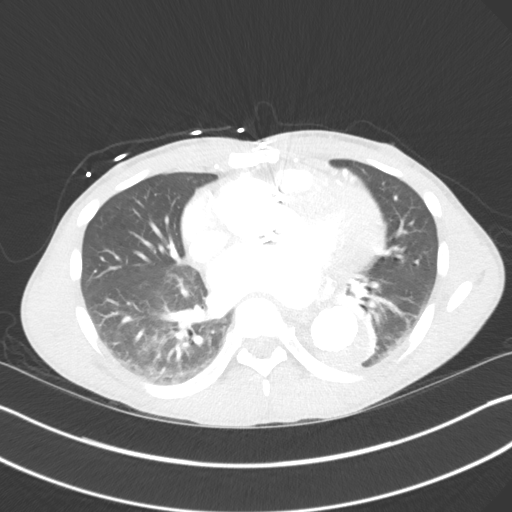
[im 185/222  lung]
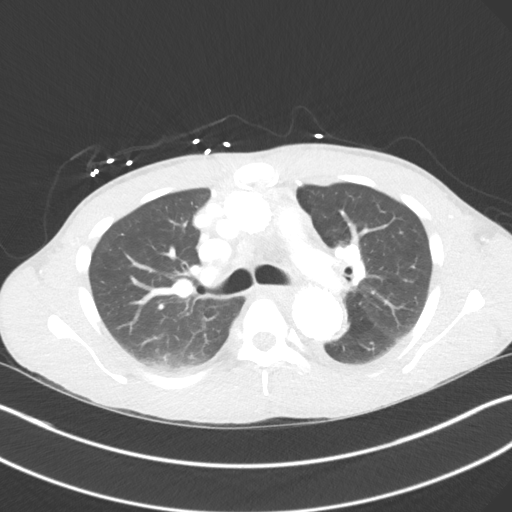
[im 203/222  soft-tissue]
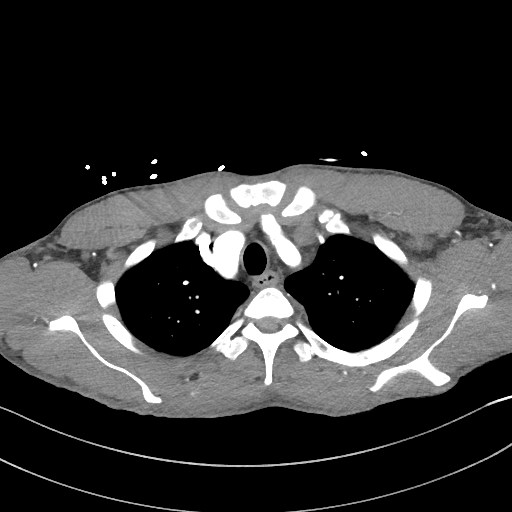
[im 203/222  lung]
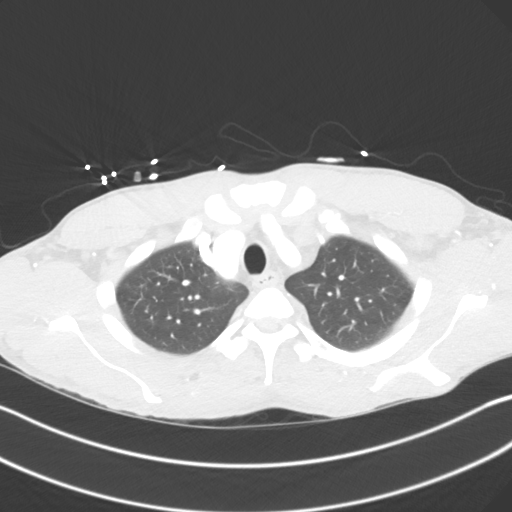

[Series 7: arterial cor · coronal · arterial · 0.78mm/px · 2 of 140 slices shown]
[im 47/140  soft-tissue]
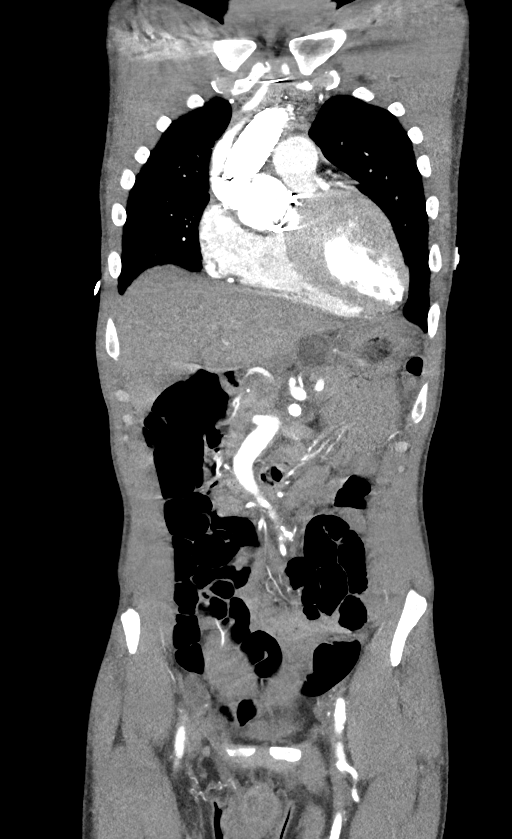
[im 93/140  soft-tissue]
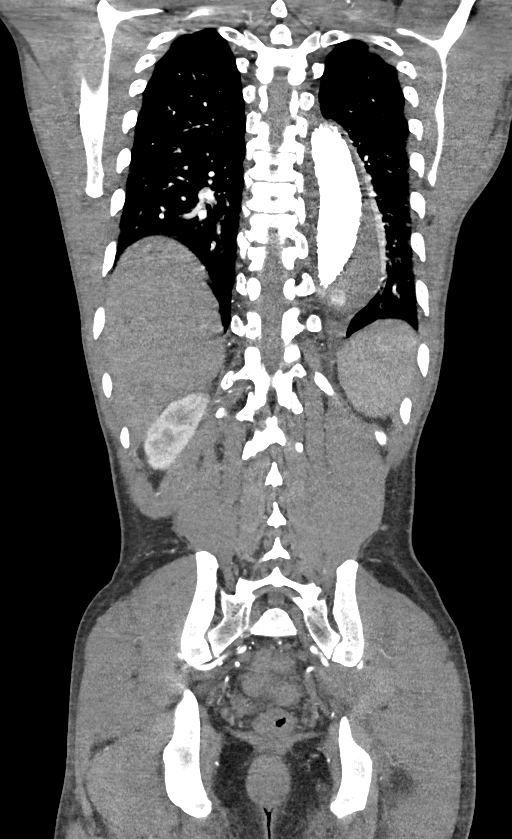

[11 of 46 positions shown; findings below may reference images not displayed]

FINDINGS: CTA CHEST FINDINGS

Appearance of prosthetic aortic valve and thoracic aortic endograft
are stable since prior CTA. Reimplanted proximal great vessels are
normally patent. No evidence of acute dissection of the thoracic
aorta. Mural thrombus adjacent to the descending thoracic endograft
appears stable. There is stable perfusion of the distal aneurysm sac
in the lower chest related to opacification of the false lumen of a
chronic aortic dissection in the abdomen.

Lungs show mild bibasilar atelectasis. No edema, infiltrate, nodule
or pneumothorax is identified. No evidence of lymphadenopathy in the
chest. Stable cardiac enlargement without pericardial fluid.

Review of the MIP images confirms the above findings.

CTA ABDOMEN AND PELVIS FINDINGS

Just below the endograft, there is evidence of chronic dissection of
the abdominal aorta. Maximal dimensions of the aorta are
approximately 3.5 x 4.7 cm. This is minimally increased since the
prior study where comparable dimensions were approximately 3.4 x
cm. Slight increase in dimensions appear to be related to slight
increase in size of the false lumen. The true lumen supplies widely
patent superior mesenteric artery and right renal artery. A left
renal artery stent off of the true lumen shows patency. The false
lumen supplies the celiac axis. Below the level of the left renal
artery, the dissection resolves and the rest of the abdominal aorta
is normally patent. There is stable aneurysmal disease of the left
common iliac artery which measures 17 mm in greatest caliber.
Bilateral internal and external iliac arteries show normal patency.
The common femoral arteries and femoral bifurcations are normally
patent.

No evidence of solid organ or bowel abnormality on the CTA study. No
abnormal fluid collections. Bony structures show no abnormalities.

Review of the MIP images confirms the above findings.
IMPRESSION: No acute findings by CTA of the chest, abdomen and pelvis. There is
no evidence of acute dissection and the thoracic endograft shows
stable patency. Caliber of the abdominal aorta at the level of
chronic dissection appears slightly larger by measurement due to
slight expansion of the false lumen. Visceral arteries remain
normally patent in the abdomen with the celiac axis supplied by the
false lumen and other major artery supplied by the true lumen.

## 2018-05-05 IMAGING — DX DG CHEST 2V
2 series · 2 of 2 positions shown · non-contrast
Comparison: 06/03/2015

CLINICAL DATA: Shortness of breath with right-sided chest pain.

EXAM:
CHEST  2 VIEW

[w chest pa]
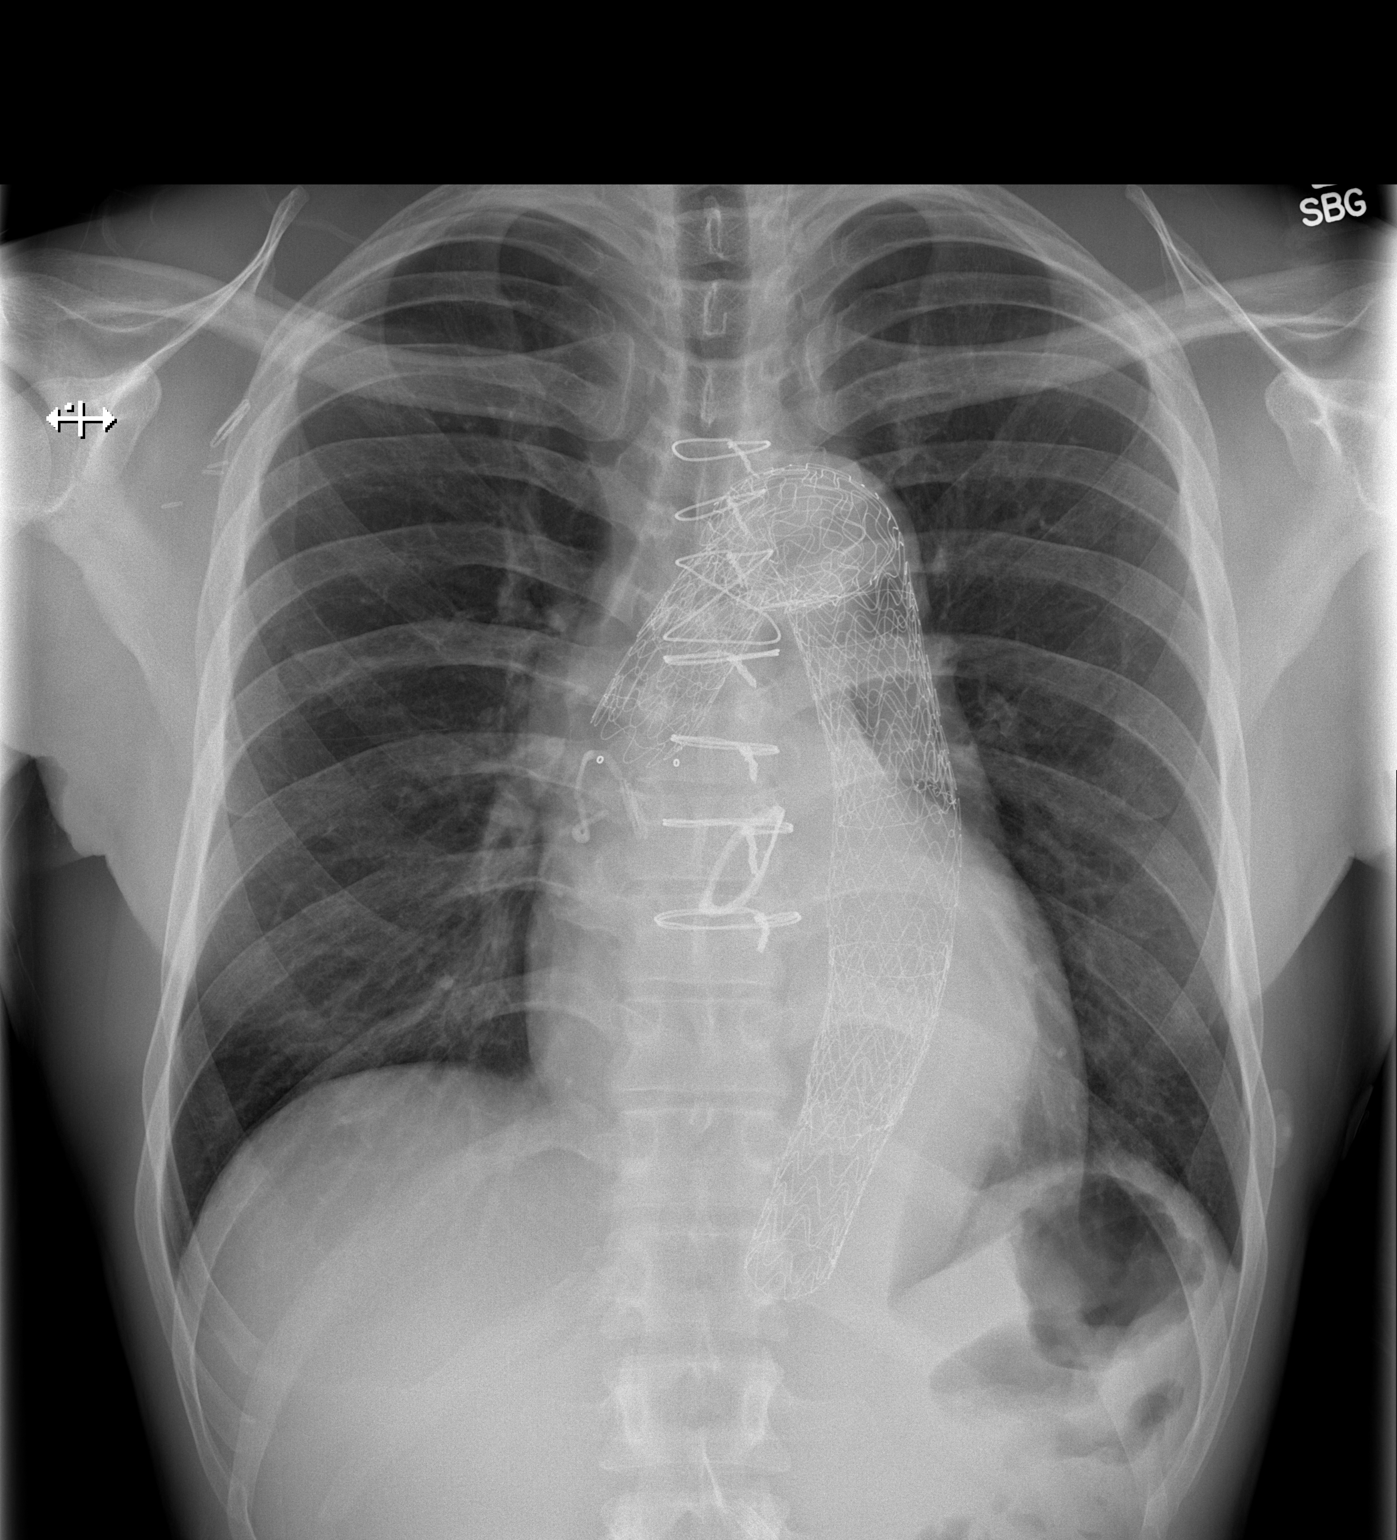

[w chest lat]
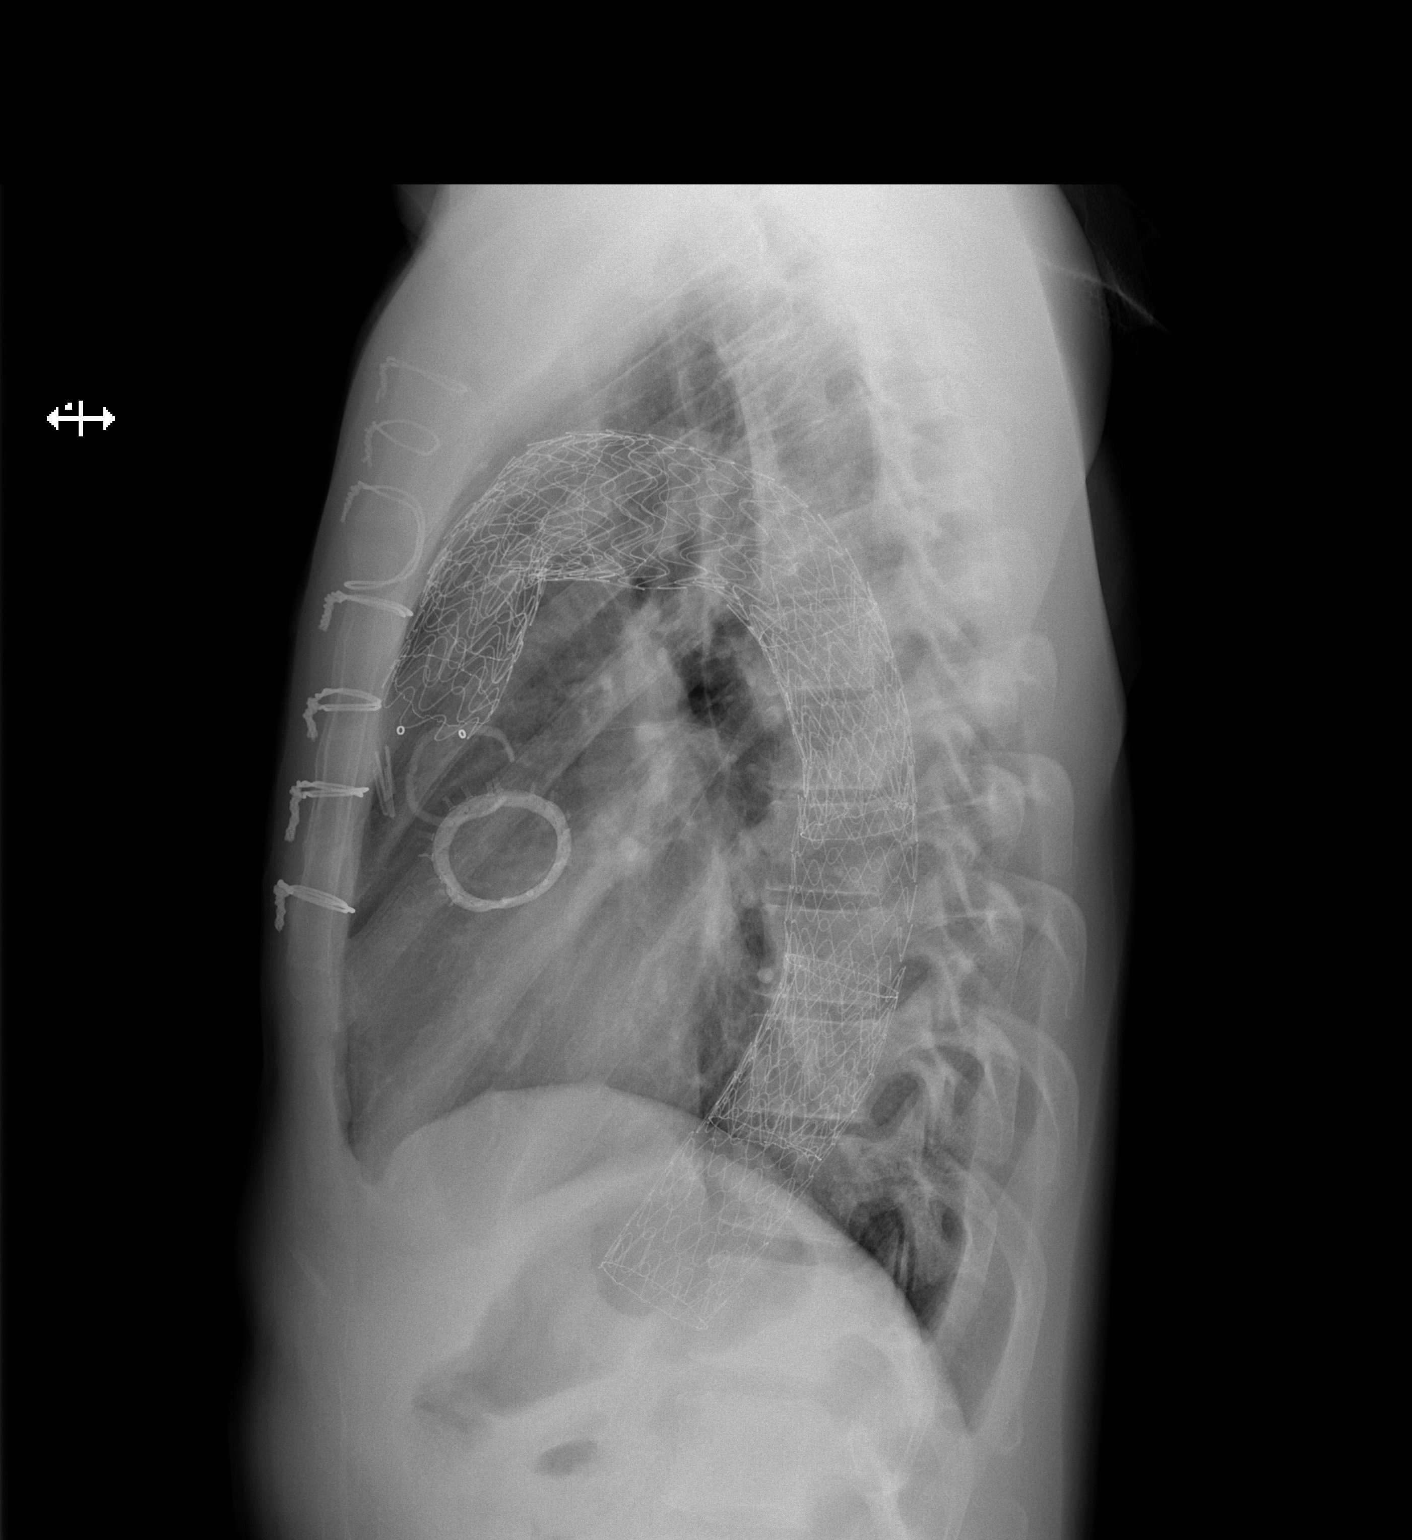

[2 of 2 positions shown; findings below may reference images not displayed]

FINDINGS: Stable appearance of the thoracic aortic stent graft. Prior median
sternotomy procedure with evidence of aortic valve and aortic root
replacement. Surgical clips in the right upper axillary region. Both
lungs are clear. Negative for a pneumothorax. Heart size is mildly
enlarged but unchanged. The trachea is midline. No large pleural
effusions. No acute bone abnormality.
IMPRESSION: No active cardiopulmonary disease.

Stable postsurgical changes.

## 2018-05-05 NOTE — Progress Notes (Signed)
05/09/2018 [name] 1970/03/23 [id number]   HPI:  [name] is a 48 y.o. male patient of Dr Aundra Dubin, who presents today for a lipid clinic evaluation.  In addition to hypertension his medical history is significant for hypertension, aortic dissection, h/o cryptogenic stroke, CAD s/p NSTEMI (DES to proximal ramus intermedius), AVR (boving pericardial tissue valve), prior alcohol abuse, and renal insufficiency (GFR 53).    Patient is in office today to discuss options to further lower his cholesterol  He takes the atorvastatin 80 mg, but admits that the tablets are difficult to swallow.  He notes that his appetite is poor and he tends to feel tired much of the time.    Current Medications: atorvastatin 80 mg qd  Cholesterol Goals: LDL < 70   Intolerant/previously tried:  Family history:   Father living at 41 - hypertension  Mother living at 43 - "healthy as a mule"  Sister healthy, brother healthy  No problems with children - oldest 48  Diet:   Mix of home and out - fast food ([employer]) several times per week  Does admit to adding salt to foods;  trying to cut back and add only at cooking  Exercise:    Starting to work out, still has problem with prior broken rib, did previously go to cardiac rehab  Labs:  04/25/2018:  TC 181, TG 98, HDL 33, LDL 128    Current Outpatient Medications  Medication Sig Dispense Refill  . acetaminophen (TYLENOL) 325 MG tablet Take 650 mg by mouth every 6 (six) hours as needed (for pain or headaches).     [name] amLODipine (NORVASC) 10 MG tablet Take 1 tablet (10 mg total) by mouth daily. 30 tablet 5  . aspirin EC 81 MG EC tablet Take 1 tablet (81 mg total) by mouth daily. 90 tablet 3  . atorvastatin (LIPITOR) 80 MG tablet Take 1 tablet (80 mg total) by mouth daily. 90 tablet 3  . carvedilol (COREG) 25 MG tablet Take 1 tablet (25 mg total) by mouth 2 (two) times daily with a meal. 180 tablet 3  . cloNIDine (CATAPRES - DOSED IN MG/24 HR) 0.3 mg/24hr  patch Place 1 patch (0.3 mg total) onto the skin once a week. 4 patch 6  . clopidogrel (PLAVIX) 75 MG tablet Take 1 tablet (75 mg total) by mouth daily with breakfast. 90 tablet 3  . furosemide (LASIX) 40 MG tablet Take one tablet daily as needed for shortness of breath or weight gain of more than 3 lbs in one day. 30 tablet 1  . gabapentin (NEURONTIN) 300 MG capsule Take 2 capsules (600 mg total) by mouth 3 (three) times daily. Needs office visit 90 capsule 0  . hydrALAZINE (APRESOLINE) 100 MG tablet Take 100 mg by mouth 2 (two) times daily.     No current facility-administered medications for this visit.     Allergies  Allergen Reactions  . Imdur [Isosorbide Nitrate] Other (See Comments)    Headaches   . Tramadol Other (See Comments)    Headaches     Past Medical History:  Diagnosis Date  . Adenomatous colon polyp 08/2015  . Anxiety   . Aortic disease ([place])   . Aortic dissection ([facility])    a. admx 04/2014 >> L renal infarct; a/c renal failure >> b.  s/p Bioprosthetic Bentall and total arch replacement and staged endovascular repair of descending aortic aneurysm ([facility] - Dr. [name])  . CAD (coronary artery disease)    a. LHC  4/16:  oD1 60%  . Cardiomyopathy ([place])    a. non-ischemic - probably related to untreated HTN and ETOH abuse - Echo 3/13 with EF 35-40% >> b. Echo 4/16: Severe LVH, EF 55-60%, moderate AI, moderate MR, mild LAE, trivial effusion, known type B dissection with communication between true and false lumens with suprasternal images suggesting dissection plane may propagate to at least left subclavian takeoff, root above aortic valve okay    . Chronic abdominal pain   . Chronic combined systolic and diastolic congestive heart failure ([place])    a. 05/2011: Adm with pulm edema/HTN urgency, EF 35-40% with diffuse hypokinesis and moderate to severe mitral regurgitation. Cardiomyopathy likely due to uncontrolled HTN and ETOH abuse - cath deferred due to renal insufficiency (felt  due to uncontrolled HTN). bJodie Echevaria MV 06/2011: EF 37% and no ischemia or infarction. c. EF 45-50% by echo 01/2012.  [name] Chronic sinusitis   . CKD (chronic kidney disease)    a. 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  peak creatinine 3.46 during admx for aortic dissection 2/16.  sees Dr [name]  . Colon polyps 09/11/2015   Descending, sigmoid polyps  . DDD (degenerative disc disease), lumbar   . Depression   . Descending thoracic aortic aneurysm ([place])   . Dissecting aneurysm of thoracic aorta ([place])   . ETOH abuse    a. Reported to have quit 05/2011.  . Frequent headaches   . GERD (gastroesophageal reflux disease)   . Headache(784.0)    "q other day" (08/08/2013)  . Heart murmur   . Hemorrhoid thrombosis   . History of echocardiogram    Echo 1/17:  Severe LVH, EF 55-60%, no RWMA, Gr 2 DD, AVR ok, mild to mod MR, mild LAE, mild reduced RVSF, mod RAE  . History of medication noncompliance   . HYPERLIPIDEMIA   . Hypertension    a. Hx of HTN urgency secondary to noncompliance. b. urinary metanephrine and catecholeamine levels normal 2013.  c. Renal art [name] 1/16:  No evidence of renal artery stenosis noted bilaterally.  . INGUINAL HERNIA   . Pneumonia ~ 2013  . Renal insufficiency   . Stroke ([place])   . Tobacco abuse   . Valvular heart disease    a. Echo 05/2011: moderate to severe eccentric MR and mild to moderate AI with prolapsing left coronary cusp. b. Echo 01/2012: mild-mod AI, mild dilitation of aortic root, mild MR.;  c. Echo 1/16: Severe LVH consistent with hypertrophic cardio myopathy, EF 50%, no RWMA, mod AI, mild MR, mild RAE, dilated Ao root (40 mm);     Blood pressure 106/68, pulse 67, resp. rate 14, height 5\' 11"  (1.803 m), weight 152 lb (68.9 kg), SpO2 97 %.   Hyperlipidemia LDL goal <70 Patient with ASCVD and LDL not at goal despite maximum statin dose.  Will start the paperwork to get him approved thru Medicaid for [place].  Will also switch his atorvastatin to rosuvastatin 40 mg -  smaller tablet will be more easy to swallow.  Once approved we will repeat labs after 5-6 doses.     Tommy Medal PharmD CPP [facility]

## 2018-05-05 NOTE — Patient Instructions (Addendum)
We will start the paperwork to get Repatha.  Once approved you will use the injections every 14 days.  We will re-check your cholesterol after the 5th or 6th dose to be sure it is working.  Finish off the atorvastatin and we will try to get an approval for rosuvastatin 40 mg.  It is equally potent, but a much smaller tablet!   Cholesterol  Cholesterol is a fat. Your body needs a small amount of cholesterol. Cholesterol (plaque) may build up in your blood vessels (arteries). That makes you more likely to have a heart attack or stroke. You cannot feel your cholesterol level. Having a blood test is the only way to find out if your level is high. Keep your test results. Work with your doctor to keep your cholesterol at a good level. What do the results mean?  Total cholesterol is how much cholesterol is in your blood.  LDL is bad cholesterol. This is the type that can build up. Try to have low LDL.  HDL is good cholesterol. It cleans your blood vessels and carries LDL away. Try to have high HDL.  Triglycerides are fat that the body can store or burn for energy. What are good levels of cholesterol?  Total cholesterol below 200.  LDL below 100 is good for people who have health risks. LDL below 70 is good for people who have very high risks.  HDL above 40 is good. It is best to have HDL of 60 or higher.  Triglycerides below 150. How can I lower my cholesterol? Diet Follow your diet program as told by your doctor.  Choose fish, white meat chicken, or [place] that is roasted or baked. Try not to eat red meat, fried foods, sausage, or lunch meats.  Eat lots of fresh fruits and vegetables.  Choose whole grains, beans, pasta, potatoes, and cereals.  Choose olive oil, corn oil, or canola oil. Only use small amounts.  Try not to eat butter, mayonnaise, shortening, or palm kernel oils.  Try not to eat foods with trans fats.  Choose low-fat or nonfat dairy foods. ? Drink skim or nonfat  milk. ? Eat low-fat or nonfat yogurt and cheeses. ? Try not to drink whole milk or cream. ? Try not to eat ice cream, egg yolks, or full-fat cheeses.  Healthy desserts include angel food cake, ginger snaps, animal crackers, hard candy, popsicles, and low-fat or nonfat frozen yogurt. Try not to eat pastries, cakes, pies, and cookies.  Exercise Follow your exercise program as told by your doctor.  Be more active. Try gardening, walking, and taking the stairs.  Ask your doctor about ways that you can be more active. Medicine  Take over-the-counter and prescription medicines only as told by your doctor. This information is not intended to replace advice given to you by your health care provider. Make sure you discuss any questions you have with your health care provider. Document Released: 05/29/2008 Document Revised: 10/02/2015 Document Reviewed: 09/12/2015 Elsevier Interactive Patient Education  2019 [place] injection What is this medicine? EVOLOCUMAB (e voe LOK ue mab) is known as a PCSK9 inhibitor. It is used to lower the level of cholesterol in the blood. It may be used alone or in combination with other cholesterol-lowering drugs. This drug may also be used to reduce the risk of heart attack, stroke, and certain types of heart surgery in patients with heart disease. This medicine may be used for other purposes; ask your health care provider  or pharmacist if you have questions. COMMON BRAND NAME(S): Repatha What should I tell my health care provider before I take this medicine? They need to know if you have any of these conditions: -an unusual or allergic reaction to evolocumab, other medicines, foods, dyes, or preservatives -pregnant or trying to get pregnant -breast-feeding How should I use this medicine? This medicine is for injection under the skin. You will be taught how to prepare and give this medicine. Use exactly as directed. Take your medicine at regular  intervals. Do not take your medicine more often than directed. It is important that you put your used needles and syringes in a special sharps container. Do not put them in a trash can. If you do not have a sharps container, call your pharmacist or health care provider to get one. Talk to your pediatrician regarding the use of this medicine in children. While this drug may be prescribed for children as young as 13 years for selected conditions, precautions do apply. Overdosage: If you think you have taken too much of this medicine contact a poison control center or emergency room at once. NOTE: This medicine is only for you. Do not share this medicine with others. What if I miss a dose? If you miss a dose, take it as soon as you can if there are more than 7 days until the next scheduled dose, or skip the missed dose and take the next dose according to your original schedule. Do not take double or extra doses. What may interact with this medicine? Interactions are not expected. This list may not describe all possible interactions. Give your health care provider a list of all the medicines, herbs, non-prescription drugs, or dietary supplements you use. Also tell them if you smoke, drink alcohol, or use illegal drugs. Some items may interact with your medicine. What should I watch for while using this medicine? You may need blood work while you are taking this medicine. What side effects may I notice from receiving this medicine? Side effects that you should report to your doctor or health care professional as soon as possible: -allergic reactions like skin rash, itching or hives, swelling of the face, lips, or tongue -signs and symptoms of high blood sugar such as dizziness; dry mouth; dry skin; fruity breath; nausea; stomach pain; increased hunger or thirst; increased urination -signs and symptoms of infection like fever or chills; cough; sore throat; pain or trouble passing urine Side effects that  usually do not require medical attention (report to your doctor or health care professional if they continue or are bothersome): -diarrhea -nausea -muscle pain -pain, redness, or irritation at site where injected This list may not describe all possible side effects. Call your doctor for medical advice about side effects. You may report side effects to FDA at 1-800-FDA-1088. Where should I keep my medicine? Keep out of the reach of children. You will be instructed on how to store this medicine. Throw away any unused medicine after the expiration date on the label. NOTE: This sheet is a summary. It may not cover all possible information. If you have questions about this medicine, talk to your doctor, pharmacist, or health care provider.  2019 Elsevier/Gold Standard (2016-10-12 13:31:00)

## 2018-05-09 ENCOUNTER — Encounter: Payer: Self-pay | Admitting: Pharmacist Clinician (PhC)/ Clinical Pharmacy Specialist

## 2018-05-09 MED ORDER — ROSUVASTATIN CALCIUM 40 MG PO TABS: 40.0000 mg | ORAL_TABLET | Freq: Every day | ORAL | 3 refills | Status: DC

## 2018-05-09 NOTE — Assessment & Plan Note (Signed)
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  Will start the paperwork to get him approved thru Medicaid for [place].  Will also switch his atorvastatin to rosuvastatin 40 mg - smaller tablet will be more easy to swallow.  Once approved we will repeat labs after 5-6 doses.

## 2018-05-17 ENCOUNTER — Ambulatory Visit (INDEPENDENT_AMBULATORY_CARE_PROVIDER_SITE_OTHER): Payer: Medicare HMO | Admitting: *Deleted

## 2018-05-17 DIAGNOSIS — I5022 Chronic systolic (congestive) heart failure: Secondary | ICD-10-CM | POA: Diagnosis not present

## 2018-05-17 DIAGNOSIS — I639 Cerebral infarction, unspecified: Secondary | ICD-10-CM

## 2018-05-18 LAB — CUP PACEART REMOTE DEVICE CHECK
Date Time Interrogation Session: 20200303143834
MDC IDC PG IMPLANT DT: 20180406

## 2018-05-24 ENCOUNTER — Telehealth: Payer: Self-pay

## 2018-05-24 NOTE — Telephone Encounter (Signed)
Pt states he sent the manual transmission because he was having some chest pains. I let him talk to the device tech nurse Raquel Sarna.

## 2018-05-24 NOTE — Telephone Encounter (Addendum)
Spoke with patient. He reports he sent a manual transmission on 05/23/18 due to chest pain. Reports chest discomfort similar to chronic symptoms reported at 04/25/18 [facility] appointment (saw [place], [place]). Advised that ILR records rhythm abnormalities but doesn't show [name] causes of chest pain. Asked pt if he has followed-up at [facility] as advised at his last appt (per notes), but line became fuzzy and call disconnected.  Called back and LMOVM advising patient to call back if he is still having symptoms, or if he has questions or concerns. Direct DC number given.

## 2018-05-24 NOTE — Progress Notes (Signed)
[facility] Summary Report / Loop Recorder 

## 2018-05-24 NOTE — Telephone Encounter (Signed)
Patient called back. Patient reports he thinks he is overdue with his cardiologist at [facility]. He reports he usually goes yearly. Explained that ILR shows NSR, no recent episodes. Clarified again that chest discomfort is right-sided, intermittent, similar to discomfort he has reported in the past, doesn't worsen with exertion. Advised of ED precautions if symptoms are acutely worsening, but encouraged patient to contact his primary cardiologist's office for f/u.  Note routed to Dr. Aundra Dubin as Juluis Rainier.

## 2018-06-01 ENCOUNTER — Telehealth: Payer: Self-pay | Admitting: Pharmacist Clinician (PhC)/ Clinical Pharmacy Specialist

## 2018-06-01 NOTE — Telephone Encounter (Signed)
Patient notes that he has had chest pain x 2-3 days, radiating down arm.  Was not specific as to whether pain was dull or stabbing.  He also noted that his arm gets numb at times.  Advised that he go to the ED  Patient voiced understanding

## 2018-06-10 IMAGING — CT CT ANGIO CHEST-ABD-PELV FOR DISSECTION W/ AND WO/W CM
1 of 10 series · 1 of 36 positions shown, 3 images · IV contrast (isovue)
Comparison: None.

CLINICAL DATA: History of dissection repair in 4099 with shortness
of breath and chest pain

EXAM:
CT ANGIOGRAPHY CHEST, ABDOMEN AND PELVIS
TECHNIQUE: Multidetector CT imaging through the chest, abdomen and pelvis was
performed using the standard protocol during bolus administration of
intravenous contrast. Multiplanar reconstructed images and MIPs were
obtained and reviewed to evaluate the vascular anatomy.
CONTRAST:  100 mL Isovue 370.

[Series 300: locator · axial · 0.38mm/px · z∈[+570,+570]mm · 1 of 1 slices shown, 3 images]
[im 1/1  mediastinal]
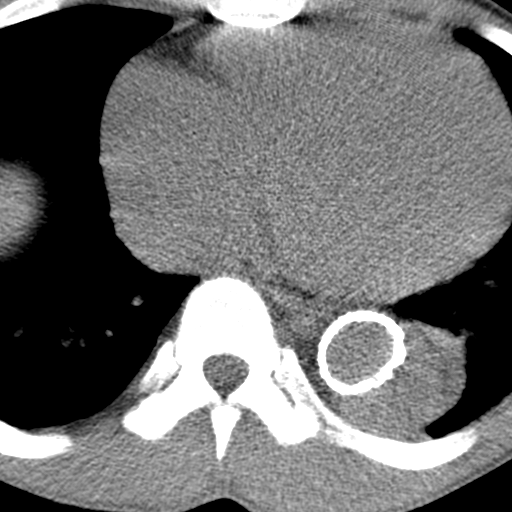
[im 1/1  lung]
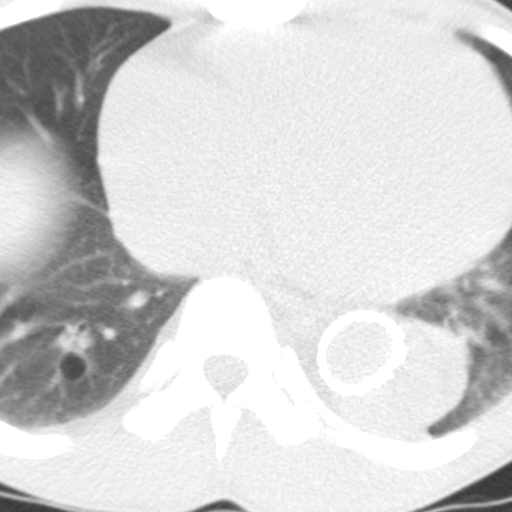
[im 1/1  bone]
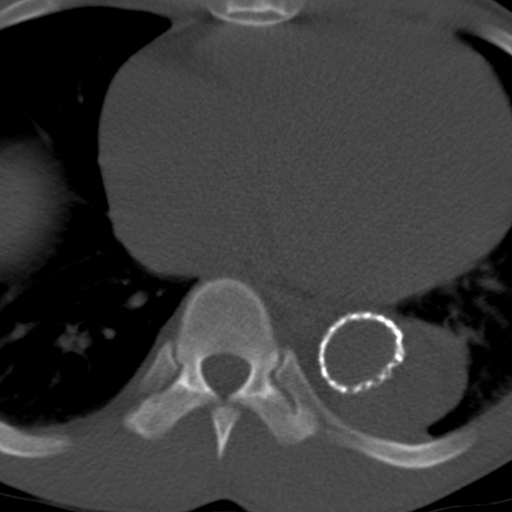

[1 of 36 positions shown; findings below may reference images not displayed]

FINDINGS: CTA CHEST FINDINGS

Vascular: The thoracic aorta demonstrates postsurgical changes with
origin of the brachiocephalic vessels from the most proximal aspect
of the ascending aorta. Stent graft is noted within almost the
entire residual thoracic aorta. Aortic valve replacement is noted.
The majority of the false lumen in the descending aorta is
thrombosed. Some minimal per fusion is again noted distally stable
from the prior exam. Pulmonary artery is visualize is within normal
limits. No evidence of pulmonary emboli is noted. Cardiac structures
are stable.

Nonvascular: Lungs are well aerated bilaterally with exception of
mild left lower lobe atelectasis along the descending aorta. This is
stable from the prior exam. No focal parenchymal nodule is seen. The
thoracic inlet is within normal limits. No acute bony abnormality is
seen. No significant hilar or mediastinal adenopathy is noted.

Review of the MIP images confirms the above findings.

CTA ABDOMEN AND PELVIS FINDINGS

Vascular: The abdominal aorta again demonstrates findings of
dissection similar to that seen on the prior exam with the
dissection terminating just below the left renal artery similar to
that seen on the prior study. The celiac axis does perfuse from the
false lumen. The maximum dimensions of the aorta at the level of the
celiac axis measure 4.6 x 3.3 cm which are roughly stable from the
prior exam.

The superior mesenteric artery, inferior mesenteric artery and renal
arteries both arise from the true lumen. Left renal artery stent is
noted and patent. The more distal aorta demonstrates calcific
changes although no evidence of dissection is noted. The iliac
arteries and femoral arteries are stable from the prior exam and
widely patent. Mild aneurysmal dilatation of the left common iliac
artery is again seen.

Nonvascular: The liver, spleen, pancreas, adrenal glands and
gallbladder are within normal limits. Kidneys are well visualized
bilaterally within normal enhancement pattern with the exception of
some mild scarring of the left kidney anteriorly. This is stable
from the prior study.

The bladder is partially distended. No pelvic mass lesion is seen.
The appendix is not well visualized although no inflammatory changes
are seen. The osseous structures show no acute abnormality.

Review of the MIP images confirms the above findings.
IMPRESSION: Stable changes of re- implantation of the brachiocephalic vessels
and stent graft placement of almost the entire residual thoracic
aorta. Aortic valve replacement is noted as well. No significant
interval change is seen from the prior study. There is persistent
opacification of the distal aspect of the false lumen which allows
for perfusion of the celiac axis. This is stable in appearance.

Extension of the dissection into the abdominal aorta is stable from
the prior study. All visceral vessels with the exception of the
celiac axis arise from the true lumen. No acute abnormality is
noted.

No significant nonvascular abnormality is noted.

## 2018-06-10 IMAGING — DX DG CHEST 2V
2 series · 2 of 2 positions shown · non-contrast
Comparison: 09/07/2015

CLINICAL DATA: Shortness of breath. General chest pain. Prior
treatment for thoracic aortic dissection.

EXAM:
CHEST  2 VIEW

[chest pa]
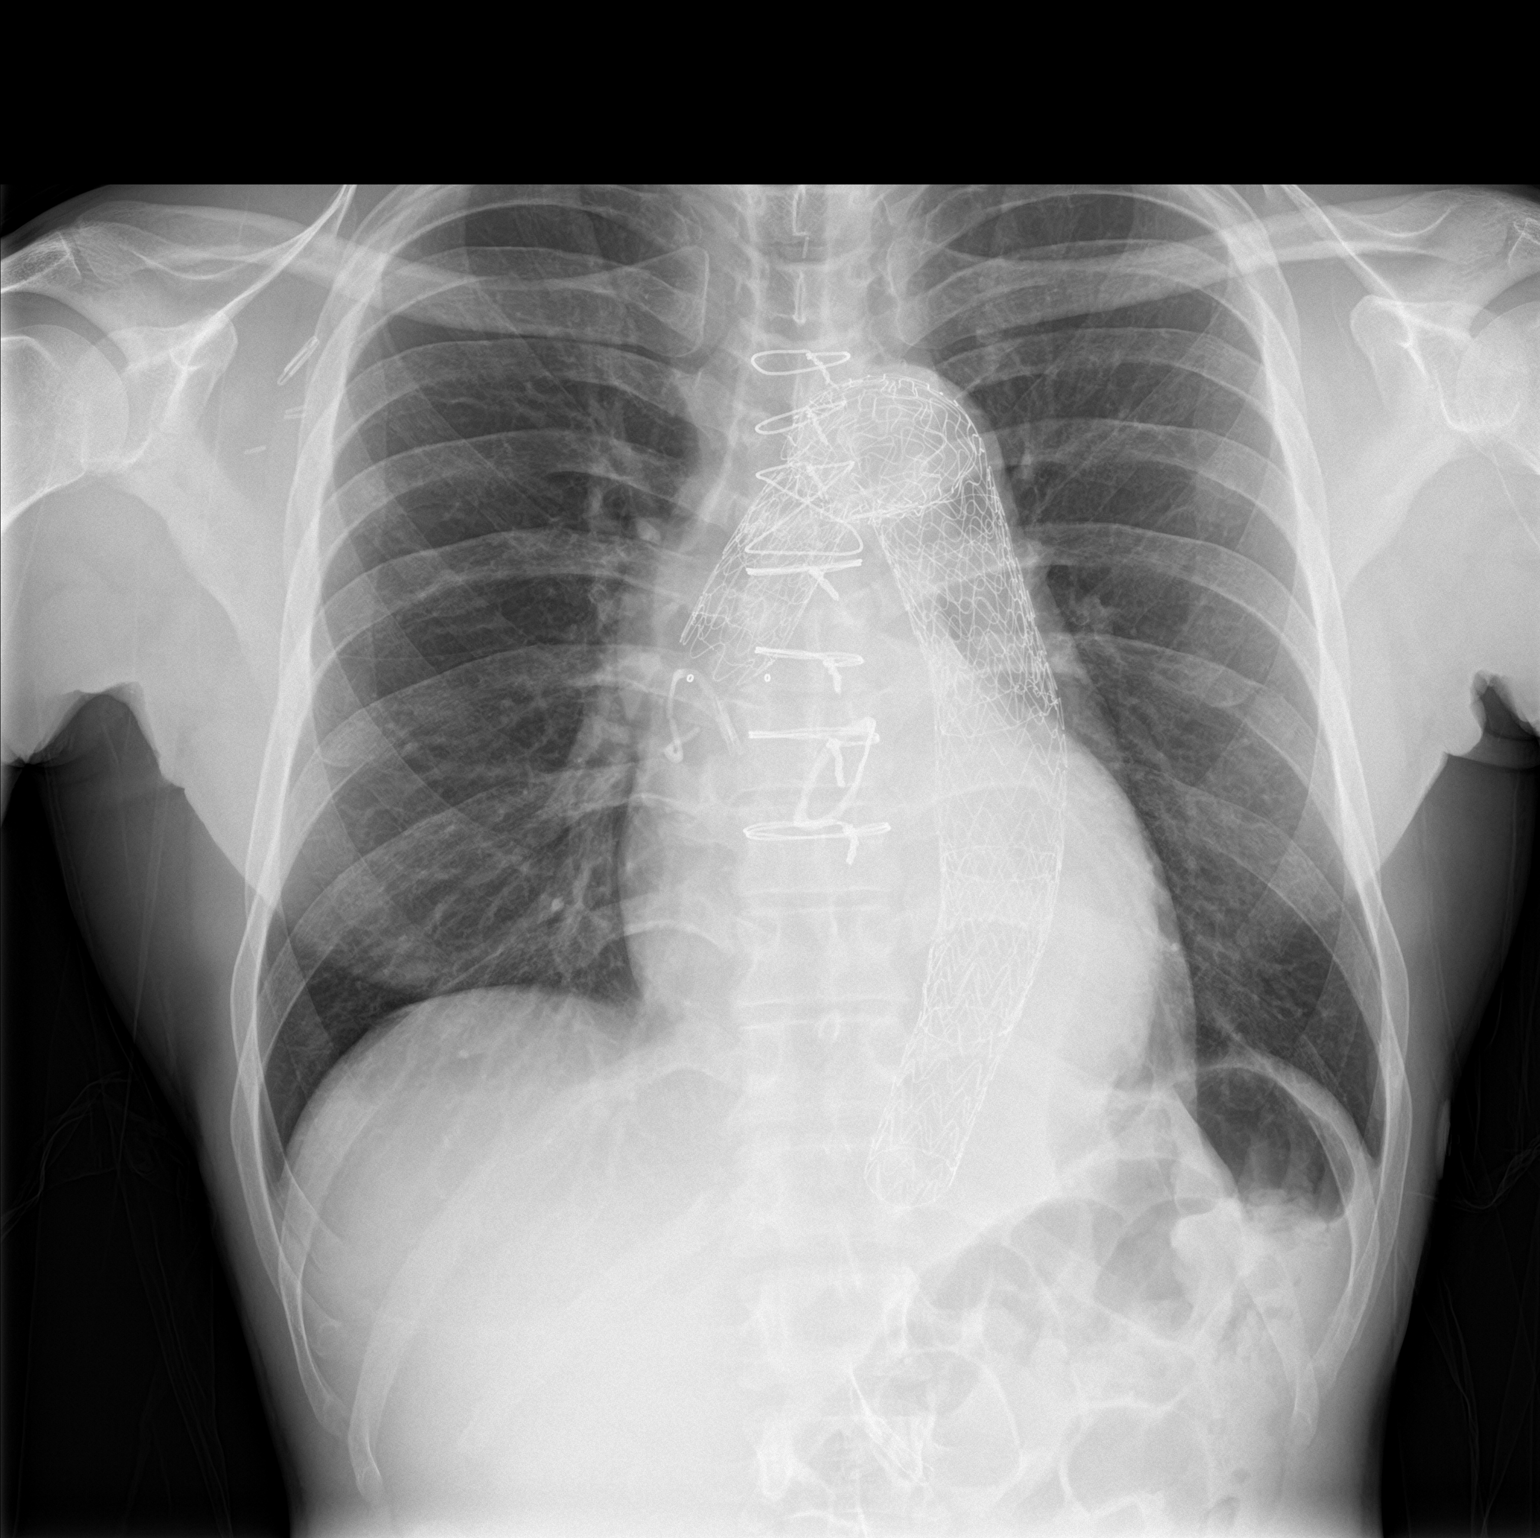

[chest lat]
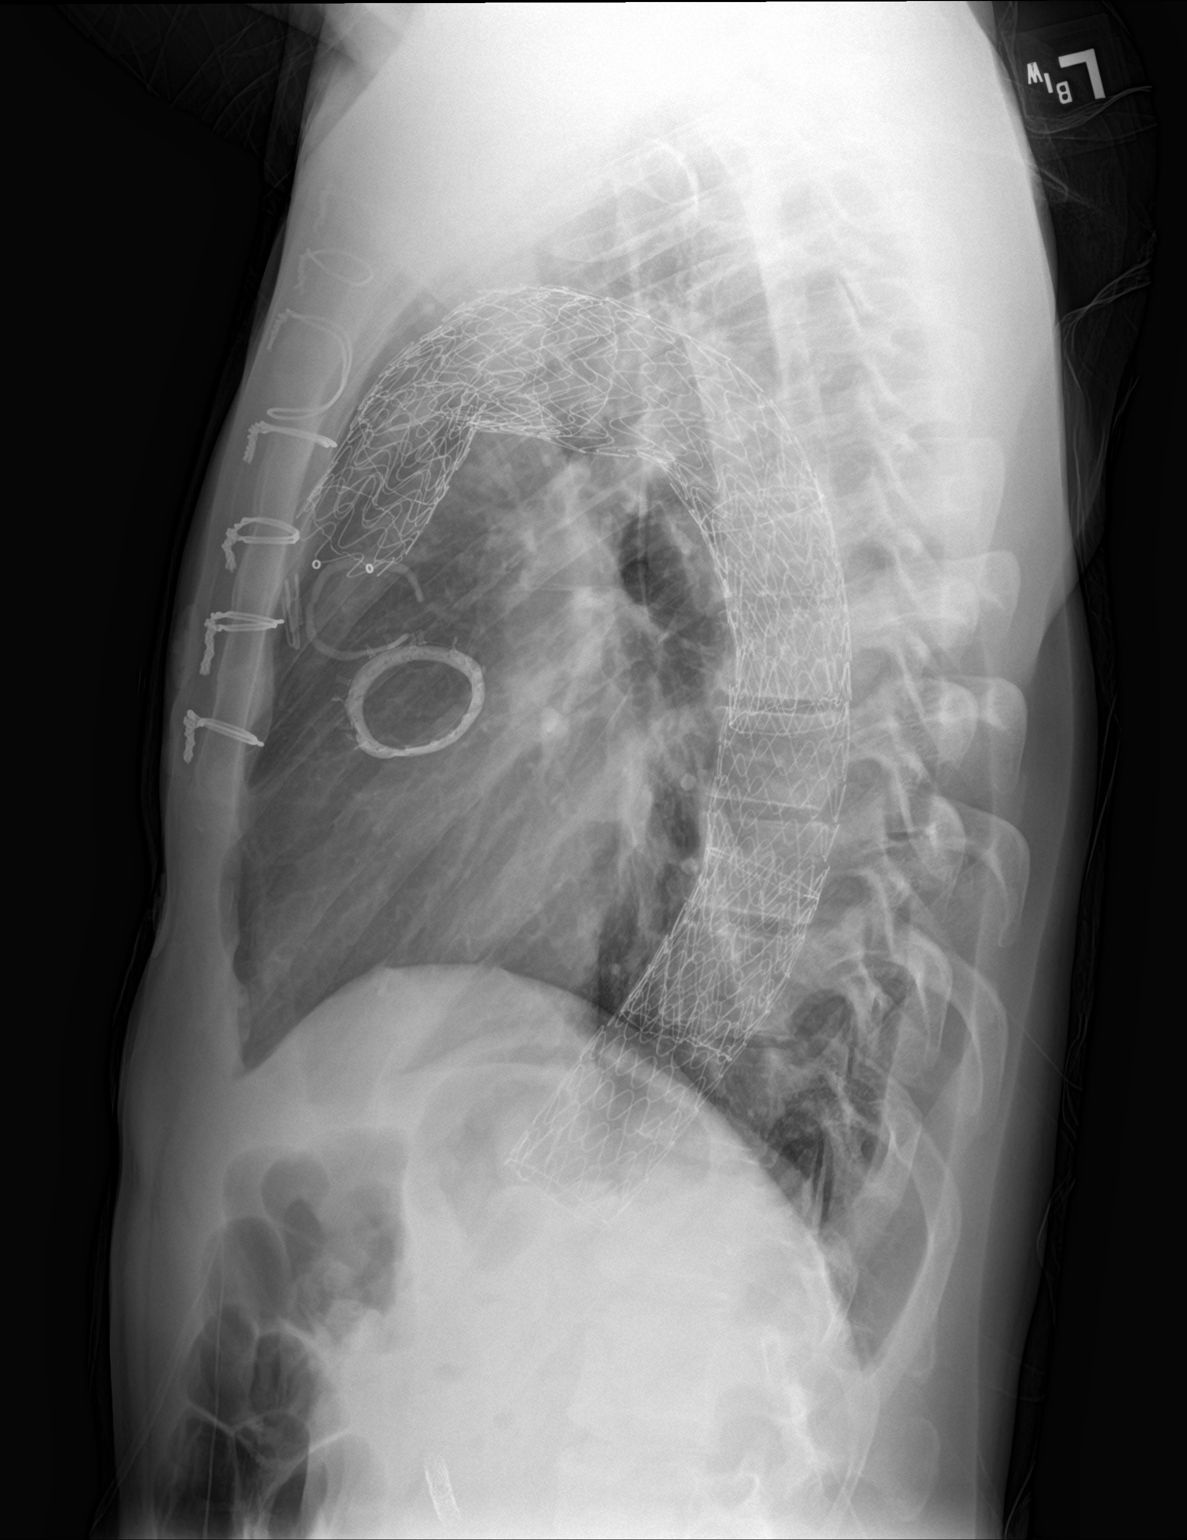

[2 of 2 positions shown; findings below may reference images not displayed]

FINDINGS: Again needed are postsurgical changes with an aortic stent graft
involving the distal ascending thoracic aorta, aortic arch and
descending thoracic aorta. Additional postsurgical changes
compatible with aortic valve surgery and great vessel bypass
procedure. Again noted are surgical clips in the right axilla. Both
lungs are clear. No pulmonary edema. Heart size is stable and within
normal limits. No pleural effusions.
IMPRESSION: No active cardiopulmonary disease.  Stable postsurgical changes.

## 2018-06-20 ENCOUNTER — Other Ambulatory Visit: Payer: Self-pay

## 2018-06-20 ENCOUNTER — Ambulatory Visit (INDEPENDENT_AMBULATORY_CARE_PROVIDER_SITE_OTHER): Payer: Medicare HMO | Admitting: *Deleted

## 2018-06-20 DIAGNOSIS — I639 Cerebral infarction, unspecified: Secondary | ICD-10-CM

## 2018-06-20 LAB — CUP PACEART REMOTE DEVICE CHECK
Date Time Interrogation Session: 20200405153920
Implantable Pulse Generator Implant Date: 20180406

## 2018-06-28 NOTE — Progress Notes (Signed)
[facility] Summary Report / Loop Recorder 

## 2018-07-01 ENCOUNTER — Telehealth: Payer: Self-pay

## 2018-07-01 NOTE — Telephone Encounter (Signed)
Unable to speak  with patient to remind of missed remote transmission 

## 2018-07-08 IMAGING — CT CT ANGIO CHEST-ABD-PELV FOR DISSECTION W/ AND WO/W CM
2 of 7 series · 12 of 46 positions shown, 14 images · IV contrast (isovue)
Comparison: 10/13/2015 and previous

CLINICAL DATA: Chest and back pain x1 week, progressive over the
past day . History CHF.

EXAM:
CT ANGIOGRAPHY CHEST, ABDOMEN, PELVIS WITH CONTRAST
TECHNIQUE: Multidetector CT imaging of the chest, abdomen, pelvis was performed
using the standard protocol during bolus administration of
intravenous contrast. Multiplanar CT image reconstructions and MIPs
were obtained to evaluate the vascular anatomy.
CONTRAST:  100 mL Isovue 370 IV

[Series 7: dissection 2mm · axial · 0.65mm/px · z∈[+720,+1278]mm · 9 of 332 slices shown, 11 images]
[im 35/332  soft-tissue]
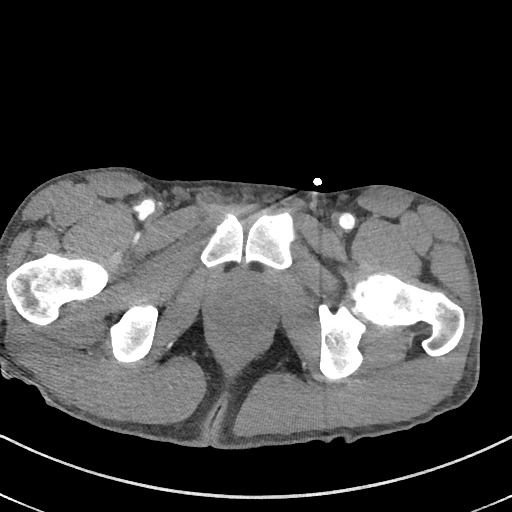
[im 35/332  bone]
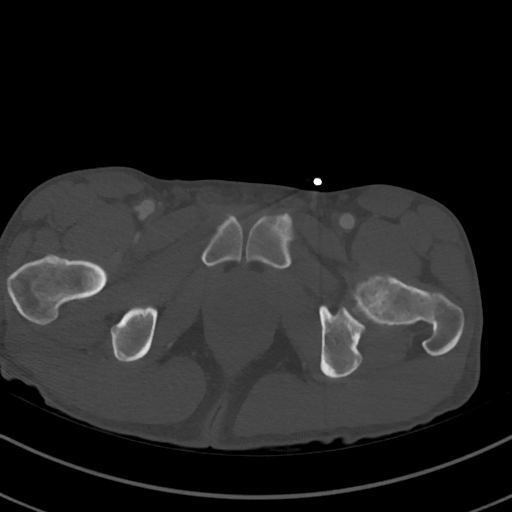
[im 70/332  soft-tissue]
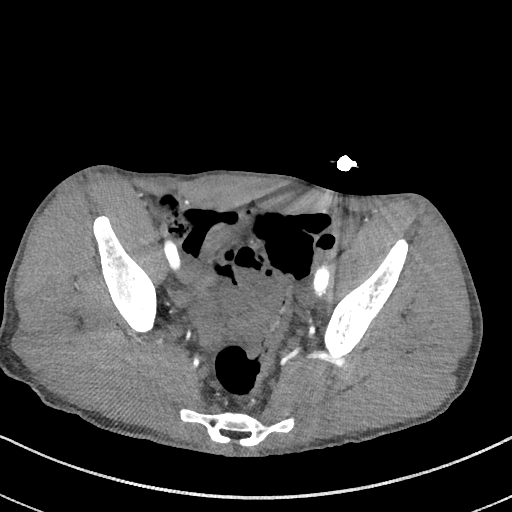
[im 105/332  soft-tissue]
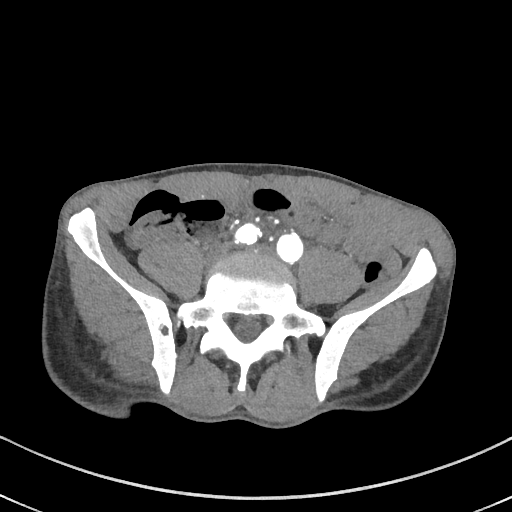
[im 140/332  soft-tissue]
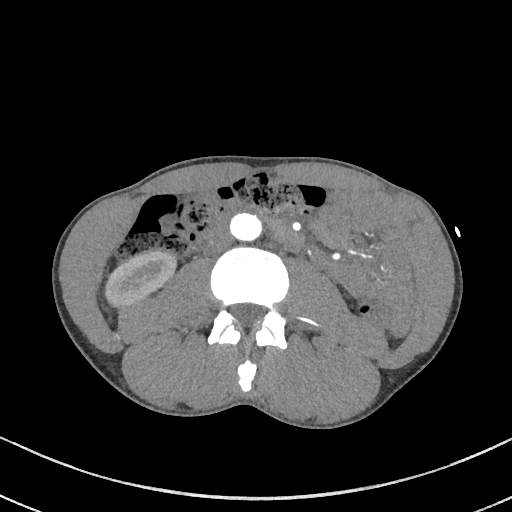
[im 175/332  soft-tissue]
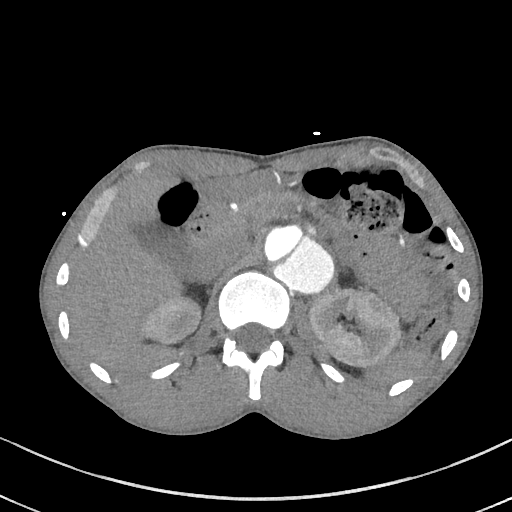
[im 210/332  soft-tissue]
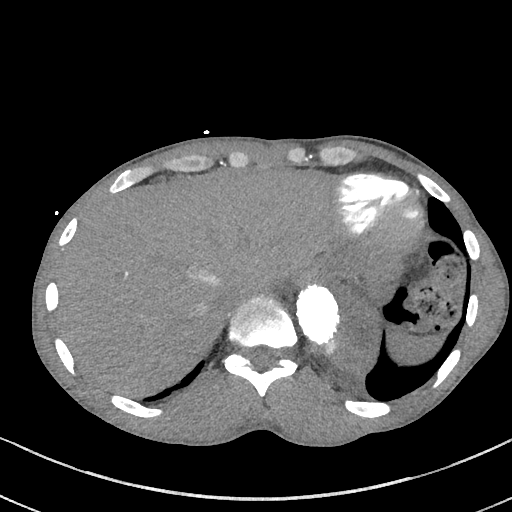
[im 244/332  soft-tissue]
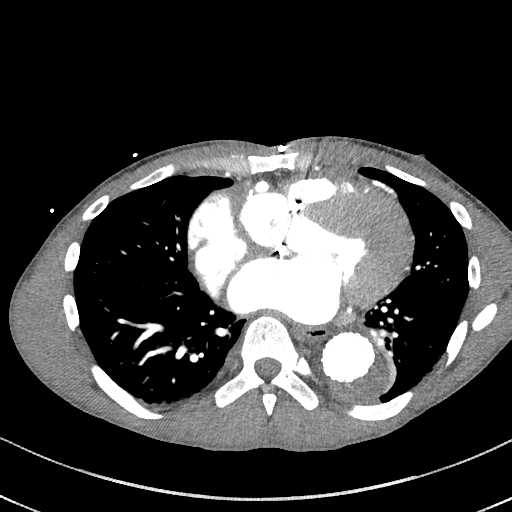
[im 279/332  soft-tissue]
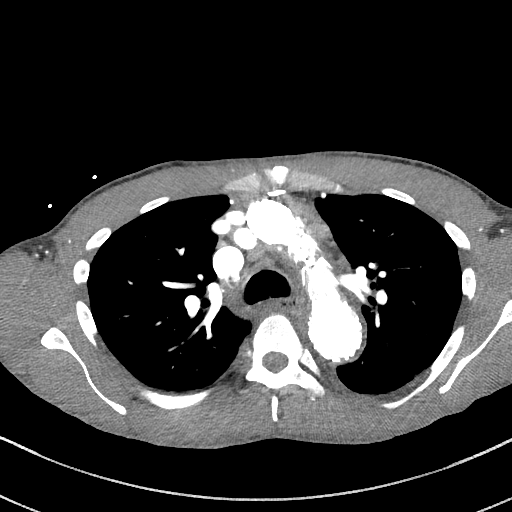
[im 314/332  soft-tissue]
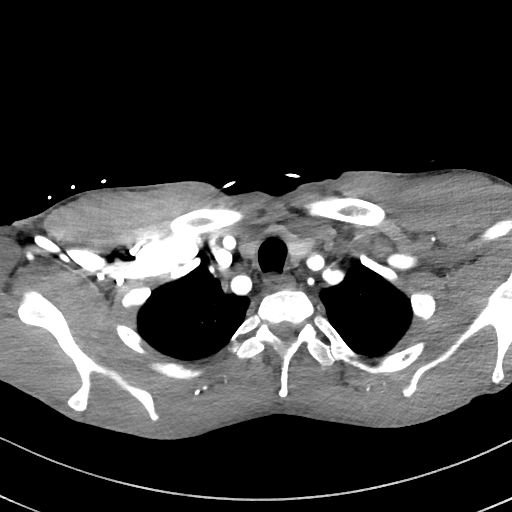
[im 314/332  bone]
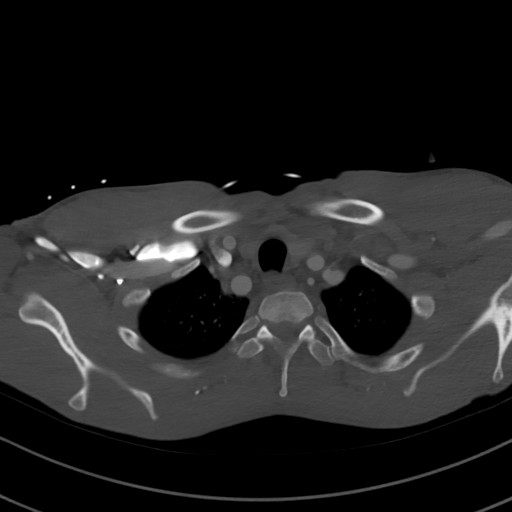

[Series 10: dissection 2mm cor · coronal · 0.56mm/px · 3 of 147 slices shown]
[im 37/147  soft-tissue]
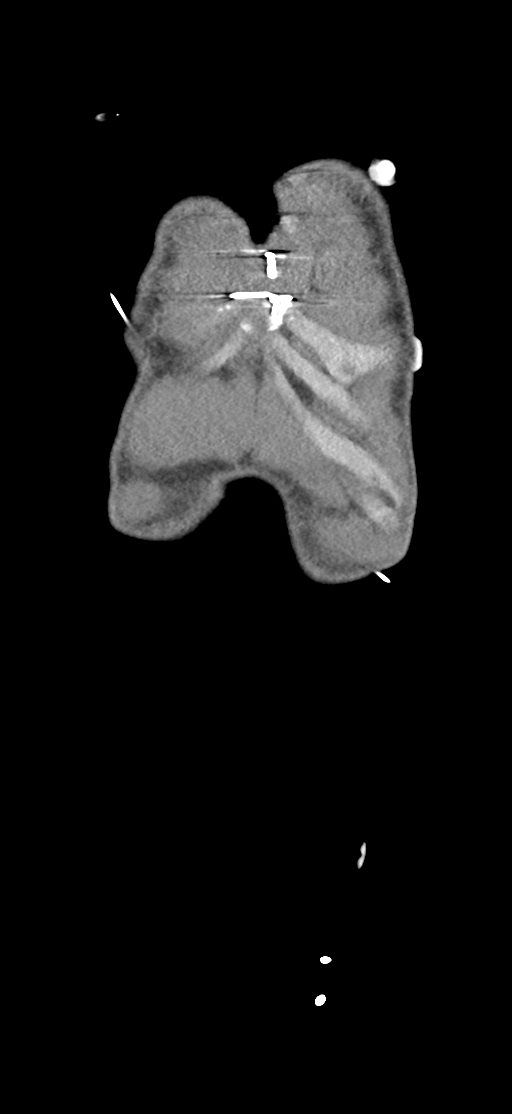
[im 74/147  soft-tissue]
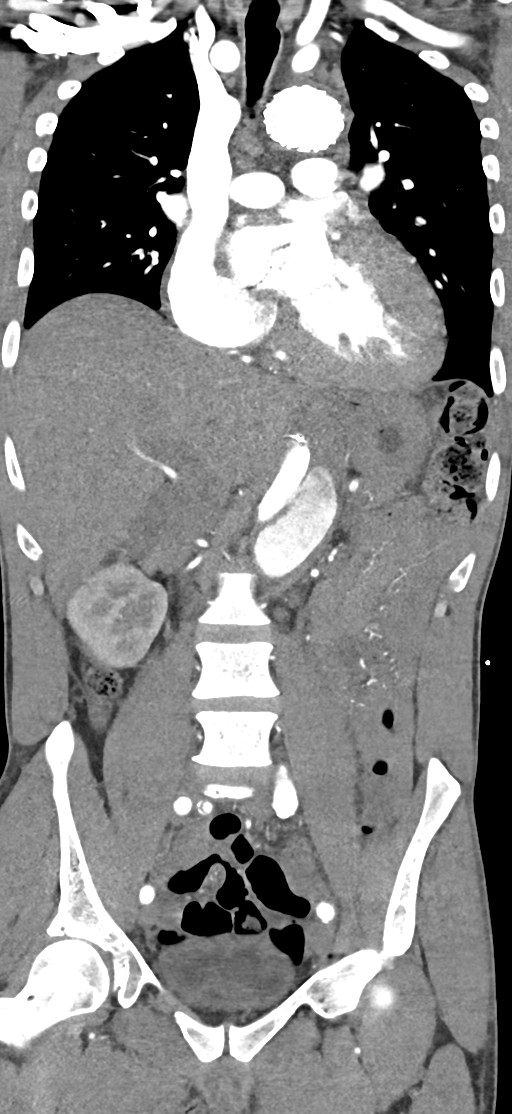
[im 110/147  soft-tissue]
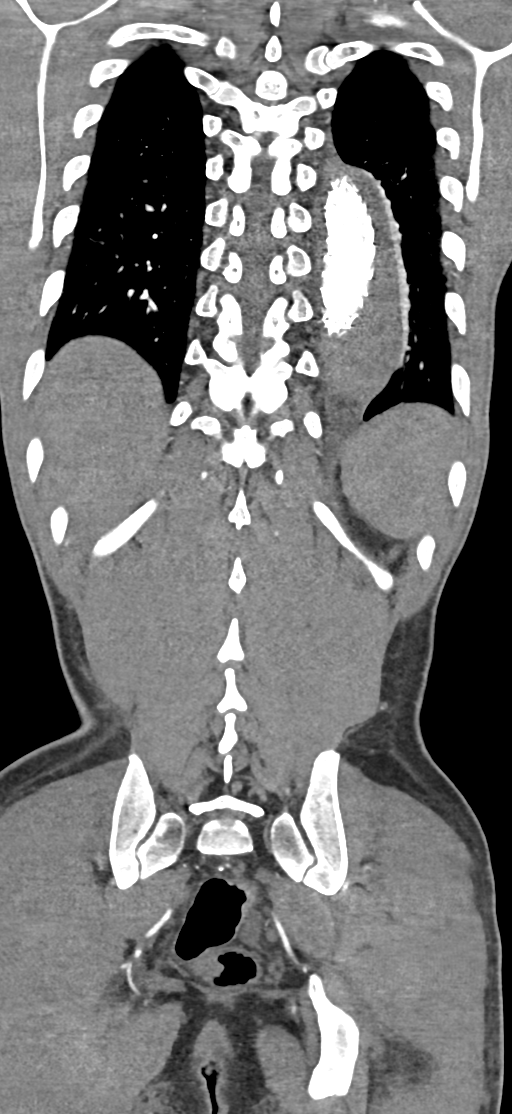

[12 of 46 positions shown; findings below may reference images not displayed]

FINDINGS: CHEST

Vascular: Right arm IV contrast administration. SVC patent. Right
atrial enlargement. The right ventricle is nondilated. Satisfactory
opacification of pulmonary arteries noted, and there is no evidence
of pulmonary emboli. Patent bilateral pulmonary veins drain into the
left atrium. Left atrial enlargement. Previous AVR. Proximal graft
from the ascending aorta trifurcates to supply brachiocephalic
vessels, with patent proximal and distal anastomoses. Stent graft
from the mid ascending aorta through the arch and descending
thoracic aorta. Probable retrograde filling of the false lumen of an
aortic dissection in the distal descending segment. The ascending
aorta is normal in caliber. Distal arch 4 cm diameter. Proximal
descending returns to a normal diameter 3.3 cm. There is eccentric
dilatation of the distal descending thoracic aorta related to the
patent false lumen, measured up to 4.7 cm in the distal descending
on the sagittal reconstructions image 105/11. The infrarenal aorta
is mildly ectatic up to 2 cm diameter with some scattered calcified
plaque.

Mediastinum/Lymph Nodes: Multiple subcentimeter prevascular, AP
window, and precarinal lymph nodes as before. No hilar adenopathy.
No mediastinal hematoma. No pericardial effusion.

Lungs/Pleura: Small scattered blebs in both upper lobes. Dependent
atelectasis posteriorly in both lower lobes. No pleural effusion. No
pneumothorax.

Musculoskeletal: Previous median sternotomy.  Negative for fracture.

ABDOMEN

Arterial

Aorta: The aortic dissection terminates below the level of renal
arteries. There is saccular left lateral dilatation of the aorta,
maximum transverse diameter 6.2 cm image 161/7, previously 5.7 cm by
my measurement. There is mild compression of the true lumen bilobed
false lumen above the level of the SMA.

Celiac axis:          Patent, supplied by false lumen.

Superior mesenteric:  Patent, supply by true lumen

Left renal: Patent stent from the true lumen into the proximal left
renal artery, patent distally.

Right renal:          Single, patent, supply by true lumen

Inferior mesenteric:  Patent, supplied by the true lumen

Left iliac: Common iliac ectatic up to 17 mm. Origin stenosis of the
internal iliac artery. Scattered plaque in the external iliac,
mildly tortuous.

Right iliac: Scattered calcified plaque. No aneurysm, stenosis, or
dissection.

Venous

Venous phase imaging not obtained.

Review of the MIP images confirms the above findings.

Nonvasular

Hepatobiliary: Probable hepatic cyst in segment 6, stable. No new
liver lesion evident on arterial phase imaging. Gallbladder is
nondilated.

Pancreas: No mass, inflammatory changes, or other significant
abnormality.

Spleen: Within normal limits in size and appearance.

Adrenals/Urinary Tract: No masses identified. No evidence of
hydronephrosis.

Stomach/Bowel: Normal adrenals. Right kidney unremarkable. Focal
parenchymal loss in the interpolar region left kidney. No mass or
hydronephrosis.

Lymphatic: No pathologically enlarged lymph nodes.

Reproductive: Mild prostatic enlargement with central coarse
calcification

Other: No ascites.  No free air.

Musculoskeletal: No suspicious bone lesions identified.
IMPRESSION: 1. No acute PE.
2. Stable ascending aorta bypass graft to brachiocephalic vessels
and thoracic aortic stent graft.
3. Residual dissection in the distal descending thoracic aorta
extending through the juxtarenal segment, with progression of
saccular aneurysm 6.2 cm diameter at the level of the SMA origin.

## 2018-07-21 ENCOUNTER — Other Ambulatory Visit: Payer: Self-pay | Admitting: Pharmacist

## 2018-07-21 MED ORDER — AMLODIPINE BESYLATE 10 MG PO TABS: 10.0000 mg | ORAL_TABLET | Freq: Every day | ORAL | 0 refills | Status: DC

## 2018-07-22 ENCOUNTER — Telehealth (HOSPITAL_COMMUNITY): Payer: Self-pay

## 2018-07-22 ENCOUNTER — Encounter: Payer: Medicare HMO | Admitting: *Deleted

## 2018-07-22 ENCOUNTER — Other Ambulatory Visit: Payer: Self-pay

## 2018-07-22 NOTE — Telephone Encounter (Signed)
error 

## 2018-07-22 NOTE — Telephone Encounter (Signed)
Pt left a message on triage VM stating he was having chest pain on both sides of chest. When called pt reports having this chest pain as well as left neck/ear pain, and left arm pain.  Denies SOB or dizziness currently. He reports he checks his blood pressure regularly and they have been normal.  Pt reports he has been having this pain sensation for approximately 6-7 months.  Spoke with NP Clegg, advised patient go to ER for evaluation as it could be an MI.  Pt advised of same, verbalized understanding.

## 2018-07-26 ENCOUNTER — Ambulatory Visit (HOSPITAL_COMMUNITY)
Admission: RE | Admit: 2018-07-26 | Discharge: 2018-07-26 | Disposition: A | Discharge status: Home / Self Care | Payer: Medicare HMO | Source: Ambulatory Visit | Attending: Cardiology | Admitting: Cardiology

## 2018-07-26 ENCOUNTER — Other Ambulatory Visit: Payer: Self-pay

## 2018-07-26 ENCOUNTER — Telehealth (HOSPITAL_COMMUNITY): Payer: Self-pay

## 2018-07-26 ENCOUNTER — Encounter (HOSPITAL_COMMUNITY): Payer: Medicare HMO | Admitting: Cardiology

## 2018-07-26 DIAGNOSIS — N179 Acute kidney failure, unspecified: Secondary | ICD-10-CM

## 2018-07-26 DIAGNOSIS — I209 Angina pectoris, unspecified: Secondary | ICD-10-CM

## 2018-07-26 DIAGNOSIS — R079 Chest pain, unspecified: Secondary | ICD-10-CM

## 2018-07-26 DIAGNOSIS — I5022 Chronic systolic (congestive) heart failure: Secondary | ICD-10-CM | POA: Diagnosis not present

## 2018-07-26 MED ORDER — AMLODIPINE BESYLATE 5 MG PO TABS: 5.0000 mg | ORAL_TABLET | Freq: Every day | ORAL | 0 refills | Status: DC

## 2018-07-26 MED ORDER — SACUBITRIL-VALSARTAN 24-26 MG PO TABS: 1.0000 | ORAL_TABLET | Freq: Two times a day (BID) | ORAL | 0 refills | Status: DC

## 2018-07-26 NOTE — Progress Notes (Addendum)
Heart Failure TeleHealth Note  Due to national recommendations of social distancing due to [place] 19, Audio/video telehealth visit is felt to be most appropriate for this patient at this time.  See MyChart message from today for patient consent regarding telehealth for [facility].  Date:  07/26/2018   ID:  [name], DOB 03/31/1970, MRN [id number]  Location: Home  Provider location: [place] [facility] Type of Visit: Established patient   PCP:  Clent Demark, PA-C  Cardiologist:  [name], MD  Chief Complaint: Chest pain   History of Present Illness: [name] is a 48 y.o. male who presents via audio/video conferencing for a telehealth visit today.     he denies symptoms worrisome for COVID 19.   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 The descending thoracic aorta has dilated to 5.1 cm. Cardiac catheterization in April 2016 demonstrated 60% first diagonal lesion. He underwent aortic valve replacement with bovine pericardial tissue valve and reconstruction along with aortic root replacement and endovascular repair of the descending thoracic aortic aneurysm at [facility]. He has had intermittent chest discomfort since then.  Admitted in 12/02/2017 chest pain. Had NSTEMI. He had LHC with stent placed proximal ramus.  He still smokes but has cut back a lot, only an occasional cigarette.  Main complaint remains chest pain.  He has aching pain on the left and right sides of his chest with radiation up his left neck.  This pain has been present on and off for  months.  It is worse with exertion but tends to be persistent even at rest.  It is better with Tylenol.  He does not have significant dyspnea walking on flat ground.  Weight is stable.    Labs (10/19): K 4.1, creatinine 1.78, myeloma panel negative Labs (2/20): K 4.2, creatinine 1.75, LDL 128  Review of Systems: All systems reviewed and negative except as per HPI.   PMH: 1. H/o ankle fractures bilaterally 2. GERD 3. HTN: Negative urinary catecholeamine collection for pheochromocytoma.  4. CKD: Stage 3. Suspect hypertensive nephropathy.  5. Cardiomyopathy: Most likely due to heavy ETOH ingestion and uncontrolled HTN. 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 Lexiscan myoview (4/13): EF 37%, global hypokinesis, no ischemic or infarction. Echo (12/14) with EF 45-50%, diffuse hypokinesis, moderate LVH, mild MR, moderate AI. Echo (4/16) with EF 55-60%, severe LVH, mild LV dilation, moderate AI and moderate MR.  - Echo (9/19): EF 35-40% with diffuse hypokinesis, severe LVH, bioprosthetic aortic valve functioning normally.  - PYP scan (1/20): No evidence for TTR amyloidosis.  6. ETOH abuse: Quit 3/13.  7. Active smoker. 8. Valvular heart disease: Echo in 3/13 showed moderate to severe eccentric MR and mild to moderate AI with prolapsing left coronary cusp. Echo 12/14 with mild MR and moderate AI. Echo 4/16 with moderate AI and moderate MR.  - S/p Bentall procedure with bioprosthetic aortic valve 9/16.  9.  CVA: Has ILR. 10.  Type B aortic dissection: Occurred in 2/16, from just distal to the left subclavian. 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   - 10/03/2014 First stage median sternotomy with right axillary cannulation for aortic valved conduit root replacement with coronary reconstruction (button Bentall Procedure), 27 mm Sorin Mitroflow  bovine pericardial valved conduit, total arch replacement (9mm Vascutek [place] multibranch arch graft with individual head vessel reimplantation. - Status post open repair of an Extent III TAA secondary to chronic dissection with a # 65mm Vascutek "Coselli" multibranch Dacron graft with individual celiac, SMA, and bilateral renal artery implantationon 01/04/2017, [facility].  11. CAD: LHC (4/16) with 60% ostial D1.  - NSTEMI 9/19: LHC (9/19) showed 50% proximal LCx, 90% ramus => DES to ramus.   Current Outpatient Medications  Medication Sig Dispense Refill  . acetaminophen (TYLENOL) 325 MG tablet Take 650 mg by mouth every 6 (six) hours as needed (for pain or headaches).     [name] amLODipine (NORVASC) 5 MG tablet Take 1 tablet (5 mg total) by mouth daily. 90 tablet 0  . aspirin EC 81 MG EC tablet Take 1 tablet (81 mg total) by mouth daily. 90 tablet 3  . atorvastatin (LIPITOR) 80 MG tablet Take 1 tablet (80 mg total) by mouth daily. 90 tablet 3  . carvedilol (COREG) 25 MG tablet Take 1 tablet (25 mg total) by mouth 2 (two) times daily with a meal. 180 tablet 3  . cloNIDine (CATAPRES - DOSED IN MG/24 HR) 0.3 mg/24hr patch Place 1 patch (0.3 mg total) onto the skin once a week. 4 patch 6  . clopidogrel (PLAVIX) 75 MG tablet Take 1 tablet (75 mg total) by mouth daily with breakfast. 90 tablet 3  . furosemide (LASIX) 40 MG tablet Take one tablet daily as needed for shortness of breath or weight gain of more than 3 lbs in one day. 30 tablet 1  . gabapentin (NEURONTIN) 300 MG capsule Take 2 capsules (600 mg total) by mouth 3 (three) times daily. Needs office visit 90 capsule 0  . hydrALAZINE (APRESOLINE) 100 MG tablet Take 100 mg by mouth 2 (two) times daily.    . rosuvastatin (CRESTOR) 40 MG tablet Take 1 tablet (40 mg total) by mouth daily. 90 tablet 3  . sacubitril-valsartan (ENTRESTO) 24-26 MG Take 1 tablet by mouth 2 (two) times daily. 180 tablet 0   No current facility-administered medications for  this encounter.     Allergies:   Imdur [isosorbide nitrate] and Tramadol   Social History:  The patient  reports that he has been smoking cigarettes. He has a 5.75 pack-year smoking history. He has never used smokeless tobacco. He reports current alcohol use of about 1.0 standard drinks of alcohol per week. He reports current drug use. Drug: Marijuana.   Family History:  The patient's family history includes Colon cancer in his paternal uncle; Diabetes in his maternal aunt; Headache in his sister; Heart attack in his brother; Hypertension in his mother and unknown relative; Lung cancer in his maternal uncle; Other in his sister; Stroke in his maternal aunt; Thyroid disease in his sister.   ROS:  Please see the history of present illness.   All other systems are personally reviewed and negative.   Exam:  (Video/Tele Health Call; Exam is subjective and or/visual.) BP 138/87 General:  Speaks in full sentences. No resp difficulty. Neck: No JVD Lungs: Normal respiratory effort with conversation.  Abdomen: Non-distended per patient report Extremities: Trace left ankle edema.  Neuro:  Alert & oriented x 3.   Recent Labs: 12/02/2017: ALT 10 03/25/2018: Hemoglobin 13.9; Platelets 128 04/25/2018: BUN 14; Creatinine, Ser 1.75; Potassium 4.2; Sodium 142  Personally reviewed   Wt Readings from Last 3 Encounters:  05/05/18 68.9 kg (152 lb)  04/25/18 68.3 kg (150 lb 8 oz)  03/25/18 68.8 kg (151 lb 9.6 oz)      ASSESSMENT AND PLAN:  1. CAD: 9/19 NSTEMI with PCI to proximal ramus.  He has atypical chest pain across his chest that radiates up his neck.  Worse with exertion but is persistent even at rest.  It has been present on and off for months.  I suspect that it may be musculoskeletal and related to his prior surgeries.  However, it is distressing to him and he worries that he has worsened coronary disease.  - I will arrange for Lexiscan Cardiolite to assess for evidence for ischemia.  - Continue  ASA 81, Plavix.  - He is now on Crestor 40 mg daily.  Will need to recheck lipids (LDL too high in 2/20, goal < 70).  2. Chronic Systolic Heart Failure: Echo in 9/19 with EF 35-40% with severe LVH and concern for infiltrative process. Myeloma panel negative and PYP scan negative, suspect due to HTN.  He is not volume overloaded on exam and is not using Lasix.  Cannot get cardiac MRI with elevated creatinine.  - Continue Coreg 25 mg bid.  - Continue hydralazine 100 mg tid, he has not been able to take even low dose Imdur without headache.  - Continue spironolactone 25 mg daily.   - Add Entresto 24/26 bid as creatinine has been stable.  Can decrease amlodipine to 5 mg daily.  Arrange for BMET now and again in 14 days.  - Repeat echo in 9/20 with aggressive medical management.  3. HTN: BP better-controlled.  As above, adding Entresto and decreasing amlodipine.  I would like to eventually get him off clonidinine.  4. S/P Bioprothetic AVR: Valve was stable on 9/19 echo.  5. S/P 2016 and 2018 thoracic aortic aneurysm repair: Followed by Dr. [name] at [facility].  6. CKD Stage III: Stable recently around 1.7, arrange for BMET.  7. Active smoker: He is cutting back, again recommended cessation.  - I will get PFTs to see if COPD is contributing to his dyspnea as he does not appear volume overloaded.   COVID screen The patient does not have any symptoms that suggest any further testing/ screening at this time.  Social distancing reinforced today.  Patient Risk: After full review of this patients clinical status, I feel that they are at moderate risk for cardiac decompensation at this time.  Relevant cardiac medications were reviewed at length with the patient today. The patient does not have concerns regarding their medications at this time.   Recommended follow-up:  2 months.   Today, I have spent 31 minutes with the patient with telehealth technology discussing the above issues .    Signed, [name], MD  07/26/2018 3:55 PM  [place] [address] [facility] [place] [address] [phone] (office) [phone] (fax)

## 2018-07-26 NOTE — Addendum Note (Signed)
Encounter addended by: Larey Dresser, MD on: 07/26/2018 4:19 PM  Actions taken: Clinical Note Signed

## 2018-07-26 NOTE — Patient Instructions (Addendum)
How to Prepare for Your Myoview Test (stress test):  1. Your remaining medications may be taken with water. 2. Nothing to eat or drink, except water, 4 hours prior to arrival time.  NO caffeine/decaffeinated products, or chocolate 12 hours prior to arrival. 3. Ladies, please do not wear dresses.  Skirts or pants are approprate, please wear a short sleeve shirt. 4. NO perfume, cologne or lotion 5. Wear comfortable walking shoes.  NO HEELS! 6. Total time is 3 to 4 hours; you may want to bring reading material for the waiting time. 7. Please report to [facility] for your test  What to expect after you arrive:  Once you arrive and check in for your appointment an IV will be started in your arm.  Then the Technoligist will inject a small amount of radioactive tracer.  There will be a 1 hour waiting period after this injection.  A series of pictures will be taken of your heart following this waiting period.  You will be prepped for the stress portion of the test.  During the stress portion of your test you will either walk on a treadmill or receive a small, safe amount of radioactive tracer injected in your IV.  After the stress portion, there is a short rest period during which time your heart and blood pressure will be monitored.  After the short rest period the Technologist will begin your second set of pictures.  Your doctor will inform you of your test results within 7-10 business days.  In preparation for your appointment, medication and supplies will be purchased.  Appointment availability is limited, so if you need to cancel or reschedule please call the office at [phone] 24 hours in advance to avoid a cancellation fee of $100.00     You will need labs drawn tomorrow, 13 May @11 :30  You will need to follow up labs in 2 weeks, 26 May @ 11:30  BEGIN taking 24/26 mg (1 tab) ENTRESTO twice a day.  DECREASE your Amlodipine to 5 mg (1 tab) daily when you begin taking your Entresto.    You have been referred to complete a Pulmonary Functions Test, this order has been placed and this office will call you when testing is available again.  Your physician recommends that you schedule a follow-up appointment in: 2 months here in the office with Dr Aundra Dubin, 14 July @ 9:40, the gate code will be 9009.

## 2018-07-26 NOTE — Telephone Encounter (Signed)
Reviewed AVS and will also mail:  How to Prepare for Your Myoview Test (stress test):  1. Your remaining medications may be taken with water. 2. Nothing to eat or drink, except water, 4 hours prior to arrival time.  NO caffeine/decaffeinated products, or chocolate 12 hours prior to arrival. 3. Ladies, please do not wear dresses.  Skirts or pants are approprate, please wear a short sleeve shirt. 4. NO perfume, cologne or lotion 5. Wear comfortable walking shoes.  NO HEELS! 6. Total time is 3 to 4 hours; you may want to bring reading material for the waiting time. 7. Please report to [facility] for your test  What to expect after you arrive:  Once you arrive and check in for your appointment an IV will be started in your arm.  Then the Technoligist will inject a small amount of radioactive tracer.  There will be a 1 hour waiting period after this injection.  A series of pictures will be taken of your heart following this waiting period.  You will be prepped for the stress portion of the test.  During the stress portion of your test you will either walk on a treadmill or receive a small, safe amount of radioactive tracer injected in your IV.  After the stress portion, there is a short rest period during which time your heart and blood pressure will be monitored.  After the short rest period the Technologist will begin your second set of pictures.  Your doctor will inform you of your test results within 7-10 business days.  In preparation for your appointment, medication and supplies will be purchased.  Appointment availability is limited, so if you need to cancel or reschedule please call the office at [phone] 24 hours in advance to avoid a cancellation fee of $100.00     You will need labs drawn tomorrow, 13 May @11 :30  You will need to follow up labs in 2 weeks, 26 May @ 11:30  BEGIN taking 24/26 mg (1 tab) ENTRESTO twice a day.  DECREASE your Amlodipine to 5 mg (1 tab) daily when  you begin taking your Entresto.   You have been referred to complete a Pulmonary Functions Test, this order has been placed and this office will call you when testing is available again.  Your physician recommends that you schedule a follow-up appointment in: 2 months here in the office with Dr Aundra Dubin, 14 July @ 9:40, the gate code will be 9009.

## 2018-07-27 ENCOUNTER — Ambulatory Visit (HOSPITAL_COMMUNITY)
Admission: RE | Admit: 2018-07-27 | Discharge: 2018-07-27 | Disposition: A | Discharge status: Home / Self Care | Payer: Medicare HMO | Source: Ambulatory Visit | Attending: Cardiology | Admitting: Cardiology

## 2018-07-27 ENCOUNTER — Other Ambulatory Visit: Payer: Self-pay

## 2018-07-27 DIAGNOSIS — I5022 Chronic systolic (congestive) heart failure: Secondary | ICD-10-CM

## 2018-07-27 LAB — BASIC METABOLIC PANEL
Anion gap: 10 (ref 5–15)
BUN: 19 mg/dL (ref 6–20)
CO2: 21 mmol/L — ABNORMAL LOW (ref 22–32)
Calcium: 9.4 mg/dL (ref 8.9–10.3)
Chloride: 108 mmol/L (ref 98–111)
Creatinine, Ser: 2.11 mg/dL — ABNORMAL HIGH (ref 0.61–1.24)
GFR calc Af Amer: 42 mL/min — ABNORMAL LOW (ref >60)
GFR calc non Af Amer: 36 mL/min — ABNORMAL LOW (ref >60)
Glucose, Bld: 85 mg/dL (ref 70–99)
Potassium: 3.8 mmol/L (ref 3.5–5.1)
Sodium: 139 mmol/L (ref 135–145)

## 2018-07-27 LAB — CBC
HCT: 45.6 % (ref 39.0–52.0)
Hemoglobin: 14.8 g/dL (ref 13.0–17.0)
MCH: 31.4 pg (ref 26.0–34.0)
MCHC: 32.5 g/dL (ref 30.0–36.0)
MCV: 96.8 fL (ref 80.0–100.0)
Platelets: 118 10*3/uL — ABNORMAL LOW (ref 150–400)
RBC: 4.71 MIL/uL (ref 4.22–5.81)
RDW: 14.6 % (ref 11.5–15.5)
WBC: 5.7 10*3/uL (ref 4.0–10.5)
nRBC: 0 % (ref 0.0–0.2)

## 2018-07-27 LAB — LIPID PANEL
Cholesterol: 165 mg/dL (ref 0–200)
HDL: 53 mg/dL (ref >40)
LDL Cholesterol: 91 mg/dL (ref 0–99)
Total CHOL/HDL Ratio: 3.1 RATIO
Triglycerides: 107 mg/dL (ref <150)
VLDL: 21 mg/dL (ref 0–40)

## 2018-07-28 ENCOUNTER — Telehealth (HOSPITAL_COMMUNITY): Payer: Self-pay

## 2018-07-28 DIAGNOSIS — I5022 Chronic systolic (congestive) heart failure: Secondary | ICD-10-CM

## 2018-07-28 NOTE — Telephone Encounter (Signed)
Pt aware of lab results. Pt advised to not start Entresto and will continue taking amlodipine daily.  Refered to lipid clinic. Verbalized understanding of all.

## 2018-07-28 NOTE — Telephone Encounter (Signed)
-----   Message from Larey Dresser, MD sent at 07/27/2018  9:58 PM EDT ----- 1. Creatinine 2.1, higher than before.  Would NOT start Entresto, keep amlodipine at 10 mg daily.   2. LDL goal < 70.  He has been on Crestor 40 mg daily, would have him seen in lipid clinic for [place].

## 2018-07-28 NOTE — Telephone Encounter (Signed)
Prior authorization through [place] was initiated for [facility] medication and sent via [place] on 07/28/2018.

## 2018-07-29 ENCOUNTER — Telehealth (HOSPITAL_COMMUNITY): Payer: Self-pay | Admitting: *Deleted

## 2018-07-29 MED ORDER — AMLODIPINE BESYLATE 10 MG PO TABS: 10.0000 mg | ORAL_TABLET | Freq: Every day | ORAL | 6 refills | Status: DC

## 2018-07-29 NOTE — Telephone Encounter (Signed)
Per Dr. Aundra Dubin, pt to increase dose of Amlodipine to 10mg  daily.  Pt aware of change and verbalized understanding.

## 2018-07-29 NOTE — Telephone Encounter (Signed)
Patient given detailed instructions per Myocardial Perfusion Study Information Sheet for the test on 08/01/18 at 8:00. Patient notified to arrive 15 minutes early and that it is imperative to arrive on time for appointment to keep from having the test rescheduled.  If you need to cancel or reschedule your appointment, please call the office within 24 hours of your appointment. . Patient verbalized understanding.[name]

## 2018-07-29 NOTE — Addendum Note (Signed)
Addended by: Valeda Malm on: 07/29/2018 01:44 PM   Modules accepted: Orders

## 2018-08-01 ENCOUNTER — Ambulatory Visit (HOSPITAL_COMMUNITY): Payer: Medicare HMO | Attending: Cardiovascular Disease

## 2018-08-01 ENCOUNTER — Other Ambulatory Visit: Payer: Self-pay

## 2018-08-01 DIAGNOSIS — R079 Chest pain, unspecified: Secondary | ICD-10-CM

## 2018-08-01 LAB — MYOCARDIAL PERFUSION IMAGING
LV dias vol: 162 mL (ref 62–150)
LV sys vol: 111 mL
Peak HR: 102 {beats}/min
Rest HR: 71 {beats}/min
SDS: 1
SRS: 1
SSS: 2
TID: 1.02

## 2018-08-01 MED ORDER — TECHNETIUM TC 99M TETROFOSMIN IV KIT
10.4000 | PACK | Freq: Once | INTRAVENOUS | Status: AC | PRN
  Administered 2018-08-01: 10.4 via INTRAVENOUS
  Filled 2018-08-01: qty 11

## 2018-08-01 MED ORDER — TECHNETIUM TC 99M TETROFOSMIN IV KIT
31.6000 | PACK | Freq: Once | INTRAVENOUS | Status: AC | PRN
  Administered 2018-08-01: 31.6 via INTRAVENOUS
  Filled 2018-08-01: qty 32

## 2018-08-01 MED ORDER — REGADENOSON 0.4 MG/5ML IV SOLN
0.4000 mg | Freq: Once | INTRAVENOUS | Status: AC
  Administered 2018-08-01: 0.4 mg via INTRAVENOUS

## 2018-08-02 ENCOUNTER — Telehealth (HOSPITAL_COMMUNITY): Payer: Self-pay

## 2018-08-02 ENCOUNTER — Telehealth: Payer: Self-pay | Admitting: Pharmacist Clinician (PhC)/ Clinical Pharmacy Specialist

## 2018-08-02 NOTE — Telephone Encounter (Signed)
Pt aware of echo results. No further questions. Pt appreciative

## 2018-08-02 NOTE — Telephone Encounter (Signed)
-----   Message from Larey Dresser, MD sent at 08/01/2018  3:15 PM EDT ----- EF low 32% but normal perfusion, no evidence for ischemia or infarction.  Think nonischemic chest pain.

## 2018-08-02 NOTE — Telephone Encounter (Signed)
Patient recently saw Dr. Aundra Dubin.  LDL cholesterol at 91 with rosuvastatin 40 mg daily.  Was previously at 128 with atorvastatin 80 mg.  Back in February we tried to get Medicaid to approve Repatha, as patient was not at goal with LDL despite being on maximum statin therapy.   Medicaid denied, as they require LDL to be > 130 on maximal therapy before approving Repatha.    Will have patient add ezetimibe 10 mg daily and repeat labs in 10-12 weeks to see if this will help him reach goal of LDL < 70.

## 2018-08-03 MED ORDER — EZETIMIBE 10 MG PO TABS: 10.0000 mg | ORAL_TABLET | Freq: Every day | ORAL | 3 refills | Status: DC

## 2018-08-03 NOTE — Telephone Encounter (Signed)
Patient agreeable to starting ezetimibe 10 mg daily.  Will repeat labs in 10-12 weeks.

## 2018-08-09 ENCOUNTER — Other Ambulatory Visit (HOSPITAL_COMMUNITY): Payer: Medicare HMO

## 2018-08-17 ENCOUNTER — Telehealth: Payer: Self-pay

## 2018-08-17 NOTE — Telephone Encounter (Signed)
Covid-19 screening questions  Have you traveled in the last 14 days? no If yes where?  Do you now or have you had a fever in the last 14 days?No  Do you have any respiratory symptoms of shortness of breath or cough now or in the last 14 days?No  Do you have any family members or close contacts with diagnosed or suspected Covid-19 in the past 14 days?No  Have you been tested for Covid-19 and found to be positive?No

## 2018-08-18 ENCOUNTER — Other Ambulatory Visit: Payer: Self-pay

## 2018-08-18 ENCOUNTER — Encounter: Payer: Self-pay | Admitting: Gastroenterology

## 2018-08-18 ENCOUNTER — Telehealth: Payer: Self-pay

## 2018-08-18 ENCOUNTER — Ambulatory Visit (INDEPENDENT_AMBULATORY_CARE_PROVIDER_SITE_OTHER): Payer: Medicare HMO | Admitting: Gastroenterology

## 2018-08-18 VITALS — BP 158/82 | HR 77 | Temp 99.2°F | Ht 71.0 in | Wt 154.0 lb

## 2018-08-18 DIAGNOSIS — K219 Gastro-esophageal reflux disease without esophagitis: Secondary | ICD-10-CM | POA: Diagnosis not present

## 2018-08-18 DIAGNOSIS — Z86010 Personal history of colonic polyps: Secondary | ICD-10-CM | POA: Diagnosis not present

## 2018-08-18 DIAGNOSIS — K5900 Constipation, unspecified: Secondary | ICD-10-CM | POA: Diagnosis not present

## 2018-08-18 MED ORDER — PANTOPRAZOLE SODIUM 40 MG PO TBEC: 40.0000 mg | DELAYED_RELEASE_TABLET | Freq: Every day | ORAL | 11 refills | Status: DC

## 2018-08-18 MED ORDER — NA SULFATE-K SULFATE-MG SULF 17.5-3.13-1.6 GM/177ML PO SOLN: 1.0000 | Freq: Once | ORAL | 0 refills | Status: AC

## 2018-08-18 NOTE — Telephone Encounter (Signed)
If he can wait until September to stop Plavix, would be ideal.  If GI feels that colonoscopy is urgent, he is > 6 months post PCI and could hold Plavix x 5 days for procedure.  If not urgent, would wait until 1 year post PCI to stop Plavix.

## 2018-08-18 NOTE — Telephone Encounter (Signed)
[facility] Pre-operative Risk Assessment     Request for surgical clearance:     Endoscopy Procedure  What type of surgery is being performed?     Colonoscopy  When is this surgery scheduled?     08/31/18  What type of clearance is required ?   Pharmacy  Are there any medications that need to be held prior to surgery and how long? Plavix x 5 days  Practice name and name of physician performing surgery?      [facility]  What is your office phone and fax number?      Phone- [phone]  [phone]  Anesthesia type (None, local, MAC, general) ?       MAC

## 2018-08-18 NOTE — Telephone Encounter (Signed)
Recieved request for colonoscopy clearance and to hold Plavix (DES placed Sept 2019).  I will route to Dr Aundra Dubin for his input on holding Plavix before contacting the patient.  Kerin Ransom PA-C 08/18/2018 2:01 PM

## 2018-08-18 NOTE — Patient Instructions (Addendum)
Patient advised to avoid spicy, acidic, citrus, chocolate, mints, fruit and fruit juices.  Limit the intake of caffeine, alcohol and Soda.  Don't exercise too soon after eating.  Don't lie down within 3-4 hours of eating.  Elevate the head of your bed.  We have sent the following medications to your pharmacy for you to pick up at your convenience: Protonix.   Start over the counter Miralax mixing 17 grams in 8 oz of water/juice daily every day.     You have been scheduled for a colonoscopy. Please follow written instructions given to you at your visit today.  Please pick up your prep supplies at the pharmacy within the next 1-3 days. If you use inhalers (even only as needed), please bring them with you on the day of your procedure.  Thank you for choosing me and [facility].  Pricilla Riffle. Dagoberto Ligas., MD., Marval Regal

## 2018-08-18 NOTE — Progress Notes (Signed)
History of Present Illness: This is a 48 year old male with multiple cardiovascular comorbidities as outlined below.  He has had chronic left-sided abdominal pain and left chest pain following AAA/TAAA repair surgeries that is unchanged. The pain is often brought on by movement and positioning.  He relates worsening ongoing difficulties with constipation and hard pellet-like stools that responds poorly to prn laxative such as Ex-Lax.  He relates frequent postprandial fullness, indigestion and takes Tums intermittently.  No other gastrointestinal complaints. Denies weight loss, diarrhea, change in stool caliber, melena, hematochezia, nausea, vomiting, dysphagia, chest pain.    Allergies  Allergen Reactions  . Imdur [Isosorbide Nitrate] Other (See Comments)    Headaches   . Tramadol Other (See Comments)    Headaches    Outpatient Medications Prior to Visit  Medication Sig Dispense Refill  . acetaminophen (TYLENOL) 325 MG tablet Take 650 mg by mouth every 6 (six) hours as needed (for pain or headaches).     [name] amLODipine (NORVASC) 10 MG tablet Take 1 tablet (10 mg total) by mouth daily. 30 tablet 6  . aspirin EC 81 MG EC tablet Take 1 tablet (81 mg total) by mouth daily. 90 tablet 3  . atorvastatin (LIPITOR) 80 MG tablet Take 1 tablet (80 mg total) by mouth daily. 90 tablet 3  . carvedilol (COREG) 25 MG tablet Take 1 tablet (25 mg total) by mouth 2 (two) times daily with a meal. 180 tablet 3  . cloNIDine (CATAPRES - DOSED IN MG/24 HR) 0.3 mg/24hr patch Place 1 patch (0.3 mg total) onto the skin once a week. 4 patch 6  . clopidogrel (PLAVIX) 75 MG tablet Take 1 tablet (75 mg total) by mouth daily with breakfast. 90 tablet 3  . ezetimibe (ZETIA) 10 MG tablet Take 1 tablet (10 mg total) by mouth daily. 90 tablet 3  . furosemide (LASIX) 40 MG tablet Take one tablet daily as needed for shortness of breath or weight gain of more than 3 lbs in one day. 30 tablet 1  . gabapentin (NEURONTIN) 300 MG  capsule Take 2 capsules (600 mg total) by mouth 3 (three) times daily. Needs office visit 90 capsule 0  . hydrALAZINE (APRESOLINE) 100 MG tablet Take 100 mg by mouth 2 (two) times daily.    . rosuvastatin (CRESTOR) 40 MG tablet Take 1 tablet (40 mg total) by mouth daily. 90 tablet 3   No facility-administered medications prior to visit.    Past Medical History:  Diagnosis Date  . Adenomatous colon polyp 08/2015  . Anxiety   . Aortic disease ([place])   . Aortic dissection ([facility])    a. admx 04/2014 >> L renal infarct; a/c renal failure >> b.  s/p Bioprosthetic [place] and total arch replacement and staged endovascular repair of descending aortic aneurysm ([facility] - Dr. [name])  . CAD (coronary artery disease)    a. LHC 4/16:  oD1 60%  . Cardiomyopathy ([place])    a. non-ischemic - probably related to untreated HTN and ETOH abuse - Echo 3/13 with EF 35-40% >> b. Echo 4/16: Severe LVH, EF 55-60%, moderate AI, moderate MR, mild LAE, trivial effusion, known type B dissection with communication between true and false lumens with suprasternal images suggesting dissection plane may propagate to at least left subclavian takeoff, root above aortic valve okay    . Chronic abdominal pain   . Chronic combined systolic and diastolic congestive heart failure ([place])    a. 05/2011: Adm with pulm edema/HTN urgency,  EF 35-40% with diffuse hypokinesis and moderate to severe mitral regurgitation. Cardiomyopathy likely due to uncontrolled HTN and ETOH abuse - cath deferred due to renal insufficiency (felt due to uncontrolled HTN). bJodie Echevaria MV 06/2011: EF 37% and no ischemia or infarction. c. EF 45-50% by echo 01/2012.  [name] Chronic sinusitis   . CKD (chronic kidney disease)    a. 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  peak creatinine 3.46 during admx for aortic dissection 2/16.  sees Dr [name]  . DDD (degenerative disc disease), lumbar   . Depression   . Descending thoracic aortic aneurysm ([place])   . Dissecting aneurysm of thoracic  aorta ([place])   . ETOH abuse    a. Reported to have quit 05/2011.  . Frequent headaches   . GERD (gastroesophageal reflux disease)   . Headache(784.0)    "q other day" (08/08/2013)  . Heart murmur   . Hemorrhoid thrombosis   . History of echocardiogram    Echo 1/17:  Severe LVH, EF 55-60%, no RWMA, Gr 2 DD, AVR ok, mild to mod MR, mild LAE, mild reduced RVSF, mod RAE  . History of medication noncompliance   . HYPERLIPIDEMIA   . Hypertension    a. Hx of HTN urgency secondary to noncompliance. b. urinary metanephrine and catecholeamine levels normal 2013.  c. Renal art [name] 1/16:  No evidence of renal artery stenosis noted bilaterally.  . INGUINAL HERNIA   . Pneumonia ~ 2013  . Renal insufficiency   . Serrated adenoma of colon 08/2015  . Stroke ([place])   . Tobacco abuse   . Valvular heart disease    a. Echo 05/2011: moderate to severe eccentric MR and mild to moderate AI with prolapsing left coronary cusp. b. Echo 01/2012: mild-mod AI, mild dilitation of aortic root, mild MR.;  c. Echo 1/16: Severe LVH consistent with hypertrophic cardio myopathy, EF 50%, no RWMA, mod AI, mild MR, mild RAE, dilated Ao root (40 mm);    Past Surgical History:  Procedure Laterality Date  . ANKLE SURGERY Bilateral    Fractures bilaterally  . AORTIC VALVE SURGERY  09/2014  . CORONARY ANGIOGRAPHY N/A 12/06/2017   Procedure: CORONARY ANGIOGRAPHY;  Surgeon: Belva Crome, MD;  Location: [facility];  Service: Cardiovascular;  Laterality: N/A;  . CORONARY STENT INTERVENTION N/A 12/10/2017   Procedure: CORONARY STENT INTERVENTION;  Surgeon: [name] Sine, MD;  Location: [facility];  Service: Cardiovascular;  Laterality: N/A;  . FOOT FRACTURE SURGERY Bilateral 2004-2010   "got pins in both of them"  . HEMORRHOID SURGERY N/A 06/15/2015   Procedure: HEMORRHOIDECTOMY;  Surgeon: Clinton Wahlberg Klein, MD;  Location: [facility];  Service: General;  Laterality: N/A;  . INGUINAL HERNIA REPAIR Right ~ 1996  . LEFT HEART  CATHETERIZATION WITH CORONARY ANGIOGRAM N/A 06/21/2014   Procedure: LEFT HEART CATHETERIZATION WITH CORONARY ANGIOGRAM;  Surgeon: Larey Dresser, MD;  Location: [facility] LAB;  Service: Cardiovascular;  Laterality: N/A;  . LOOP RECORDER INSERTION N/A 06/19/2016   Procedure: Loop Recorder Insertion;  Surgeon: Deboraha Sprang, MD;  Location: [facility];  Service: Cardiovascular;  Laterality: N/A;  . TEE WITHOUT CARDIOVERSION N/A 03/12/2016   Procedure: TRANSESOPHAGEAL ECHOCARDIOGRAM (TEE);  Surgeon: Sanda Klein, MD;  Location: [facility] ENDOSCOPY;  Service: Cardiovascular;  Laterality: N/A;   Social History   Socioeconomic History  . Marital status: Married    Spouse name: Not on file  . Number of children: 5  . Years of education: 71  .  Highest education level: Not on file  Occupational History  . Occupation: Disabled, part-time Programmer, systems    Comment: Disability  Social Needs  . Financial resource strain: Not hard at all  . Food insecurity:    Worry: Never true    Inability: Never true  . Transportation needs:    Medical: No    Non-medical: No  Tobacco Use  . Smoking status: Current Some Day Smoker    Packs/day: 0.25    Years: 23.00    Pack years: 5.75    Types: Cigarettes  . Smokeless tobacco: Never Used  . Tobacco comment: 3 to 4 per day  Substance and Sexual Activity  . Alcohol use: Yes    Alcohol/week: 1.0 standard drinks    Types: 1 Cans of beer per week    Comment: occasionally  . Drug use: Yes    Types: Marijuana    Comment: 2 times/week  . Sexual activity: Yes  Lifestyle  . Physical activity:    Days per week: Not on file    Minutes per session: Not on file  . Stress: Not on file  Relationships  . Social connections:    Talks on phone: Not on file    Gets together: Not on file    Attends religious service: Not on file    Active member of club or organization: Not on file    Attends meetings of clubs or organizations: Not on file    Relationship status:  Not on file  Other Topics Concern  . Not on file  Social History Narrative   Fun: Enjoy his children   Family History  Problem Relation Age of Onset  . Hypertension Mother   . Hypertension Other   . Colon cancer Paternal Uncle   . Stroke Maternal Aunt   . Heart attack Brother   . Diabetes Maternal Aunt   . Lung cancer Maternal Uncle   . Other Sister        "breathing machine at night"  . Headache Sister   . Thyroid disease Sister   . Throat cancer Neg Hx   . Pancreatic cancer Neg Hx   . Esophageal cancer Neg Hx   . Kidney disease Neg Hx   . Liver disease Neg Hx       Physical Exam: General: Well developed, well nourished, no acute distress Head: Normocephalic and atraumatic Eyes:  sclerae anicteric, EOMI Ears: Normal auditory acuity Mouth: No deformity or lesions Lungs: Clear throughout to auscultation Heart: Regular rate and rhythm; no murmurs, rubs or bruits Abdomen: Soft, non tender and non distended. No masses, hepatosplenomegaly or hernias noted. Normal Bowel sounds Rectal: deferred to colonosocpy Musculoskeletal: Symmetrical with no gross deformities  Pulses:  Normal pulses noted Extremities: No clubbing, cyanosis, edema or deformities noted Neurological: Alert oriented x 4, grossly nonfocal Psychological:  Alert and cooperative. Normal mood and affect    Assessment and Recommendations:  1. History of SSP/SSA and worsening constipation.  Schedule colonoscopy. The risks (including bleeding, perforation, infection, missed lesions, medication reactions and possible hospitalization or surgery if complications occur), benefits, and alternatives to colonoscopy with possible biopsy and possible polypectomy were discussed with the patient and they consent to proceed.   2. Cardiomyopathy LV EF 35-40% per echo in 11/2017 and 32% per Nuclear study in 07/2018.  3. AAA repair.   4. TAAA repair.   5. AVR.   6. CAD with stent. S/P NSTEMI.   7. Constipation. Miralax  1-2 times daily titrate dose  for a complete bowel movement daily.   8. GERD. Antireflux measures. Start Protonix 40 mg po qam.   9. Chronic left abdomen, left chest pain, unchanged. Likely related to incisions/adhesions.   10. Hold Plavix 5 days before procedure - will instruct when and how to resume after procedure. Low but real risk of cardiovascular event such as heart attack, stroke, embolism, thrombosis or ischemia/infarct of other organs off Plavix explained and need to seek urgent help if this occurs. The patient consents to proceed. Will communicate by phone or EMR with patient's prescribing provider to confirm that holding Plavix is reasonable in this case.

## 2018-08-19 NOTE — Telephone Encounter (Signed)
Notified patient of Dr. Lynne Leader and Dr. Claris Gladden recommendations. Cancelled Colonoscopy at the hospital and recall put in Epic for 11/2018.

## 2018-08-19 NOTE — Telephone Encounter (Signed)
Colonoscopy in Sept/Oct 2020 per Dr. Claris Gladden advice.

## 2018-08-22 NOTE — Telephone Encounter (Signed)
Procedure has been postponed until Sep/Oct, per physician recommendation. I will remove from preop pool.

## 2018-08-26 ENCOUNTER — Other Ambulatory Visit (HOSPITAL_COMMUNITY): Payer: Medicare HMO

## 2018-08-31 ENCOUNTER — Ambulatory Visit (HOSPITAL_COMMUNITY): Admit: 2018-08-31 | Payer: Medicare HMO | Admitting: Gastroenterology

## 2018-08-31 ENCOUNTER — Encounter (HOSPITAL_COMMUNITY): Payer: Self-pay

## 2018-08-31 SURGERY — COLONOSCOPY WITH PROPOFOL
Anesthesia: Monitor Anesthesia Care

## 2018-09-13 ENCOUNTER — Telehealth (HOSPITAL_COMMUNITY): Payer: Self-pay

## 2018-09-13 NOTE — Telephone Encounter (Signed)
-----   Message from Kara Mead sent at 09/13/2018 12:30 PM EDT ----- Regarding: Covid testing for PFTs Hey,  Pt of DM who is scheduled for PFTs on 09/19/2018 @8 :30 here at [facility] & pt is aware.  Nira Conn said to let you both know so one of you can get his Covid testing ordered & scheduled.  Pt is aware that one of you will call him with Covid appt info & instructions.   Thanks [employer]

## 2018-09-13 NOTE — Telephone Encounter (Signed)
LM X2 for patient to return call to office to schedule pre PFTs covid screen.

## 2018-09-14 ENCOUNTER — Other Ambulatory Visit: Payer: Self-pay | Admitting: Nephrology

## 2018-09-14 ENCOUNTER — Other Ambulatory Visit (HOSPITAL_COMMUNITY): Payer: Self-pay

## 2018-09-14 ENCOUNTER — Ambulatory Visit
Admission: RE | Admit: 2018-09-14 | Discharge: 2018-09-14 | Disposition: A | Discharge status: Home / Self Care | Payer: Medicare HMO | Source: Ambulatory Visit | Attending: Nephrology | Admitting: Nephrology

## 2018-09-14 DIAGNOSIS — N183 Chronic kidney disease, stage 3 unspecified: Secondary | ICD-10-CM

## 2018-09-14 NOTE — Telephone Encounter (Signed)
Spoke with patient, advised need to schedule covid screen.  appt made for 7/2. Pt advised of quarantine expectations prior to PFT's.  Pt advised of where to have testing done. Verbalized understanding.

## 2018-09-15 ENCOUNTER — Other Ambulatory Visit (HOSPITAL_COMMUNITY)
Admission: RE | Admit: 2018-09-15 | Discharge: 2018-09-15 | Disposition: A | Discharge status: Home / Self Care | Payer: Medicare HMO | Source: Ambulatory Visit | Attending: Cardiology | Admitting: Cardiology

## 2018-09-15 DIAGNOSIS — Z1159 Encounter for screening for other viral diseases: Secondary | ICD-10-CM | POA: Diagnosis present

## 2018-09-15 LAB — SARS CORONAVIRUS 2 (TAT 6-24 HRS): SARS Coronavirus 2: NEGATIVE

## 2018-09-19 ENCOUNTER — Other Ambulatory Visit: Payer: Self-pay

## 2018-09-19 ENCOUNTER — Ambulatory Visit (HOSPITAL_COMMUNITY)
Admission: RE | Admit: 2018-09-19 | Discharge: 2018-09-19 | Disposition: A | Discharge status: Home / Self Care | Payer: Medicare HMO | Source: Ambulatory Visit | Attending: Cardiology | Admitting: Cardiology

## 2018-09-19 DIAGNOSIS — I5022 Chronic systolic (congestive) heart failure: Secondary | ICD-10-CM | POA: Diagnosis not present

## 2018-09-19 LAB — PULMONARY FUNCTION TEST
DL/VA % pred: 81 %
DL/VA: 3.65 ml/min/mmHg/L
DLCO unc % pred: 48 %
DLCO unc: 14.94 ml/min/mmHg
FEF 25-75 Post: 1.45 L/sec
FEF 25-75 Pre: 1.33 L/sec
FEF2575-%Change-Post: 8 %
FEF2575-%Pred-Post: 40 %
FEF2575-%Pred-Pre: 37 %
FEV1-%Change-Post: 1 %
FEV1-%Pred-Post: 60 %
FEV1-%Pred-Pre: 59 %
FEV1-Post: 2.17 L
FEV1-Pre: 2.14 L
FEV1FVC-%Change-Post: 6 %
FEV1FVC-%Pred-Pre: 88 %
FEV6-%Change-Post: -2 %
FEV6-%Pred-Post: 65 %
FEV6-%Pred-Pre: 66 %
FEV6-Post: 2.83 L
FEV6-Pre: 2.89 L
FEV6FVC-%Change-Post: 2 %
FEV6FVC-%Pred-Post: 101 %
FEV6FVC-%Pred-Pre: 99 %
FVC-%Change-Post: -4 %
FVC-%Pred-Post: 64 %
FVC-%Pred-Pre: 67 %
FVC-Post: 2.85 L
FVC-Pre: 2.98 L
Post FEV1/FVC ratio: 76 %
Post FEV6/FVC ratio: 99 %
Pre FEV1/FVC ratio: 72 %
Pre FEV6/FVC Ratio: 97 %
RV % pred: 84 %
RV: 1.69 L
TLC % pred: 64 %
TLC: 4.6 L

## 2018-09-19 MED ORDER — ALBUTEROL SULFATE (2.5 MG/3ML) 0.083% IN NEBU
2.5000 mg | INHALATION_SOLUTION | Freq: Once | RESPIRATORY_TRACT | Status: AC
  Administered 2018-09-19: 2.5 mg via RESPIRATORY_TRACT

## 2018-09-23 ENCOUNTER — Emergency Department (HOSPITAL_COMMUNITY): Payer: Medicare HMO

## 2018-09-23 ENCOUNTER — Other Ambulatory Visit: Payer: Self-pay

## 2018-09-23 ENCOUNTER — Observation Stay (HOSPITAL_BASED_OUTPATIENT_CLINIC_OR_DEPARTMENT_OTHER)
Admission: EM | Admit: 2018-09-23 | Discharge: 2018-09-24 | Disposition: A | Discharge status: Home / Self Care | Payer: Medicare HMO | Source: Home / Self Care | Attending: Emergency Medicine | Admitting: Emergency Medicine

## 2018-09-23 ENCOUNTER — Other Ambulatory Visit (HOSPITAL_COMMUNITY): Payer: Self-pay

## 2018-09-23 ENCOUNTER — Encounter (HOSPITAL_COMMUNITY): Payer: Self-pay

## 2018-09-23 DIAGNOSIS — Z7982 Long term (current) use of aspirin: Secondary | ICD-10-CM | POA: Insufficient documentation

## 2018-09-23 DIAGNOSIS — Z8679 Personal history of other diseases of the circulatory system: Secondary | ICD-10-CM

## 2018-09-23 DIAGNOSIS — N189 Chronic kidney disease, unspecified: Secondary | ICD-10-CM | POA: Insufficient documentation

## 2018-09-23 DIAGNOSIS — Z79899 Other long term (current) drug therapy: Secondary | ICD-10-CM | POA: Insufficient documentation

## 2018-09-23 DIAGNOSIS — I130 Hypertensive heart and chronic kidney disease with heart failure and stage 1 through stage 4 chronic kidney disease, or unspecified chronic kidney disease: Secondary | ICD-10-CM | POA: Insufficient documentation

## 2018-09-23 DIAGNOSIS — R079 Chest pain, unspecified: Secondary | ICD-10-CM | POA: Insufficient documentation

## 2018-09-23 DIAGNOSIS — Z952 Presence of prosthetic heart valve: Secondary | ICD-10-CM

## 2018-09-23 DIAGNOSIS — I25119 Atherosclerotic heart disease of native coronary artery with unspecified angina pectoris: Secondary | ICD-10-CM | POA: Diagnosis present

## 2018-09-23 DIAGNOSIS — I10 Essential (primary) hypertension: Secondary | ICD-10-CM | POA: Diagnosis present

## 2018-09-23 DIAGNOSIS — Z20828 Contact with and (suspected) exposure to other viral communicable diseases: Secondary | ICD-10-CM | POA: Insufficient documentation

## 2018-09-23 DIAGNOSIS — N184 Chronic kidney disease, stage 4 (severe): Secondary | ICD-10-CM | POA: Diagnosis present

## 2018-09-23 DIAGNOSIS — I2510 Atherosclerotic heart disease of native coronary artery without angina pectoris: Secondary | ICD-10-CM | POA: Insufficient documentation

## 2018-09-23 DIAGNOSIS — I214 Non-ST elevation (NSTEMI) myocardial infarction: Secondary | ICD-10-CM | POA: Diagnosis not present

## 2018-09-23 DIAGNOSIS — I5042 Chronic combined systolic (congestive) and diastolic (congestive) heart failure: Secondary | ICD-10-CM | POA: Insufficient documentation

## 2018-09-23 LAB — BASIC METABOLIC PANEL
Anion gap: 10 (ref 5–15)
BUN: 16 mg/dL (ref 6–20)
CO2: 22 mmol/L (ref 22–32)
Calcium: 9.2 mg/dL (ref 8.9–10.3)
Chloride: 109 mmol/L (ref 98–111)
Creatinine, Ser: 2.17 mg/dL — ABNORMAL HIGH (ref 0.61–1.24)
GFR calc Af Amer: 41 mL/min — ABNORMAL LOW (ref >60)
GFR calc non Af Amer: 35 mL/min — ABNORMAL LOW (ref >60)
Glucose, Bld: 89 mg/dL (ref 70–99)
Potassium: 3.6 mmol/L (ref 3.5–5.1)
Sodium: 141 mmol/L (ref 135–145)

## 2018-09-23 LAB — CBC
HCT: 43.5 % (ref 39.0–52.0)
Hemoglobin: 14.5 g/dL (ref 13.0–17.0)
MCH: 31.5 pg (ref 26.0–34.0)
MCHC: 33.3 g/dL (ref 30.0–36.0)
MCV: 94.4 fL (ref 80.0–100.0)
Platelets: 132 10*3/uL — ABNORMAL LOW (ref 150–400)
RBC: 4.61 MIL/uL (ref 4.22–5.81)
RDW: 13.9 % (ref 11.5–15.5)
WBC: 6.8 10*3/uL (ref 4.0–10.5)
nRBC: 0 % (ref 0.0–0.2)

## 2018-09-23 LAB — TROPONIN I (HIGH SENSITIVITY): Troponin I (High Sensitivity): 53 ng/L — ABNORMAL HIGH (ref <18)

## 2018-09-23 MED ORDER — LABETALOL HCL 5 MG/ML IV SOLN
20.0000 mg | Freq: Once | INTRAVENOUS | Status: AC
  Administered 2018-09-23: 20 mg via INTRAVENOUS
  Filled 2018-09-23: qty 4

## 2018-09-23 MED ORDER — ASPIRIN 81 MG PO CHEW
324.0000 mg | CHEWABLE_TABLET | Freq: Once | ORAL | Status: AC
  Administered 2018-09-23: 324 mg via ORAL
  Filled 2018-09-23: qty 4

## 2018-09-23 MED ORDER — ONDANSETRON HCL 4 MG/2ML IJ SOLN
4.0000 mg | Freq: Once | INTRAMUSCULAR | Status: AC
  Administered 2018-09-23: 4 mg via INTRAVENOUS
  Filled 2018-09-23: qty 2

## 2018-09-23 MED ORDER — MORPHINE SULFATE (PF) 4 MG/ML IV SOLN
4.0000 mg | Freq: Once | INTRAVENOUS | Status: AC
  Administered 2018-09-23: 4 mg via INTRAVENOUS
  Filled 2018-09-23: qty 1

## 2018-09-23 MED ORDER — SODIUM CHLORIDE 0.9% FLUSH
3.0000 mL | Freq: Once | INTRAVENOUS | Status: AC
  Administered 2018-09-24: 3 mL via INTRAVENOUS

## 2018-09-23 MED ORDER — METOCLOPRAMIDE HCL 5 MG/ML IJ SOLN
10.0000 mg | Freq: Once | INTRAMUSCULAR | Status: AC
  Administered 2018-09-23: 10 mg via INTRAVENOUS
  Filled 2018-09-23: qty 2

## 2018-09-23 MED ORDER — CLONIDINE 0.3 MG/24HR TD PTWK: 0.3000 mg | MEDICATED_PATCH | TRANSDERMAL | 6 refills | Status: DC

## 2018-09-23 MED ORDER — NITROGLYCERIN 0.4 MG SL SUBL
0.4000 mg | SUBLINGUAL_TABLET | SUBLINGUAL | Status: DC | PRN
  Administered 2018-09-23: 0.4 mg via SUBLINGUAL
  Filled 2018-09-23: qty 1

## 2018-09-23 MED ORDER — DIPHENHYDRAMINE HCL 50 MG/ML IJ SOLN
25.0000 mg | Freq: Once | INTRAMUSCULAR | Status: AC
  Administered 2018-09-23: 25 mg via INTRAVENOUS
  Filled 2018-09-23: qty 1

## 2018-09-23 NOTE — ED Triage Notes (Signed)
Pt reports headache, chest pain and left arm pain for the past week. SOB on exertion. Pt has hx of MI and aneurysm. Pt a.o, nad noted

## 2018-09-23 NOTE — ED Provider Notes (Signed)
[facility] EMERGENCY DEPARTMENT Provider Note   CSN: [id number] Arrival date & time: 09/23/18  2032     History   Chief Complaint Chief Complaint  Patient presents with  . Chest Pain  . Headache    HPI [name] is a 48 y.o. male.     Patient with PMH of aortic dissection with endovascular repair at [facility] in 2016, CAD, renal insufficiency, HL, HTN, presents to the ED with a chief complaint chest pain.  He states that he has been having slowly worsening chest pain that radiates to his arm.  The symptoms have been going on for about a week.  He states that he has taken tylenol with no relief.  He reports that he has been out of his medications.  Additionally, he states he has had a headache for a while. He denies any other associated symptoms.  The history is provided by the patient. No language interpreter was used.    Past Medical History:  Diagnosis Date  . Adenomatous colon polyp 08/2015  . Anxiety   . Aortic disease ([place])   . Aortic dissection (HCC)    a. admx 04/2014 >> L renal infarct; a/c renal failure >> b.  s/p Bioprosthetic [place] and total arch replacement and staged endovascular repair of descending aortic aneurysm ([facility] - Dr. [name])  . CAD (coronary artery disease)    a. LHC 4/16:  oD1 60%  . Cardiomyopathy ([place])    a. non-ischemic - probably related to untreated HTN and ETOH abuse - Echo 3/13 with EF 35-40% >> b. Echo 4/16: Severe LVH, EF 55-60%, moderate AI, moderate MR, mild LAE, trivial effusion, known type B dissection with communication between true and false lumens with suprasternal images suggesting dissection plane may propagate to at least left subclavian takeoff, root above aortic valve okay    . Chronic abdominal pain   . Chronic combined systolic and diastolic congestive heart failure ([place])    a. 05/2011: Adm with pulm edema/HTN urgency, EF 35-40% with diffuse hypokinesis and moderate to severe mitral regurgitation. Cardiomyopathy  likely due to uncontrolled HTN and ETOH abuse - cath deferred due to renal insufficiency (felt due to uncontrolled HTN). bJodie Echevaria MV 06/2011: EF 37% and no ischemia or infarction. c. EF 45-50% by echo 01/2012.  [name] Chronic sinusitis   . CKD (chronic kidney disease)    a. 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  peak creatinine 3.46 during admx for aortic dissection 2/16.  sees Dr [name]  . DDD (degenerative disc disease), lumbar   . Depression   . Descending thoracic aortic aneurysm ([place])   . Dissecting aneurysm of thoracic aorta ([place])   . ETOH abuse    a. Reported to have quit 05/2011.  . Frequent headaches   . GERD (gastroesophageal reflux disease)   . Headache(784.0)    "q other day" (08/08/2013)  . Heart murmur   . Hemorrhoid thrombosis   . History of echocardiogram    Echo 1/17:  Severe LVH, EF 55-60%, no RWMA, Gr 2 DD, AVR ok, mild to mod MR, mild LAE, mild reduced RVSF, mod RAE  . History of medication noncompliance   . HYPERLIPIDEMIA   . Hypertension    a. Hx of HTN urgency secondary to noncompliance. b. urinary metanephrine and catecholeamine levels normal 2013.  c. Renal art [name] 1/16:  No evidence of renal artery stenosis noted bilaterally.  . INGUINAL HERNIA   . Pneumonia ~ 2013  . Renal insufficiency   .  Serrated adenoma of colon 08/2015  . Stroke ([place])   . Tobacco abuse   . Valvular heart disease    a. Echo 05/2011: moderate to severe eccentric MR and mild to moderate AI with prolapsing left coronary cusp. b. Echo 01/2012: mild-mod AI, mild dilitation of aortic root, mild MR.;  c. Echo 1/16: Severe LVH consistent with hypertrophic cardio myopathy, EF 50%, no RWMA, mod AI, mild MR, mild RAE, dilated Ao root (40 mm);     Patient Active Problem List   Diagnosis Date Noted  . Chronic systolic (congestive) heart failure ([place]) 04/25/2018  . Acute on chronic combined systolic and diastolic CHF (congestive heart failure) ([place]) 12/08/2017  . Coronary artery disease involving native  coronary artery of native heart with angina pectoris ([place]) 12/06/2017  . NSTEMI (non-ST elevated myocardial infarction) ([place]) 12/02/2017  . Hypertensive emergency - Accelerated Hypertension 12/02/2017  . History of stroke 10/05/2017  . Somnolence, daytime 04/07/2017  . Snoring 04/07/2017  . Cerebral infarction due to embolism of left middle cerebral artery ([place]) 06/03/2016  . History of aortic dissection 06/03/2016  . Palpitation   . Cerebrovascular accident (CVA) due to embolism of left middle cerebral artery ([place])   . Seizures ([place]) 02/28/2016  . HLD (hyperlipidemia) 08/28/2015  . 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[place]) 06/07/2015  . Cardiomyopathy ([place]) 03/01/2015  . Acid reflux 03/01/2015  . Essential hypertension 03/01/2015  . S/P aortic dissection repair 10/03/2014  . H/O aortic valve replacement 10/03/2014  . CKD (chronic kidney disease) 05/15/2014  . AKI (acute kidney injury) ([place])   . Dissecting aneurysm of thoracic aorta, Stanford type B ([place]) 04/24/2014  . Aortic dissection ([place]) 04/23/2014  . Aneurysm of ascending aorta (HCC) 03/28/2014  . Dyspnea 03/27/2014  . ETOH abuse 09/20/2013  . Malignant hypertension 02/20/2013  . Hypertensive urgency 01/27/2012  . Cardiomyopathy, hypertensive ([place]) 01/26/2012  . AI (aortic insufficiency) 01/26/2012  . MR (mitral regurgitation) 01/26/2012  . Acute renal failure superimposed on stage 3 chronic kidney disease ([place]) 01/26/2012  . Hypertensive cardiomyopathy, without heart failure ([place]): EF reduced to 35-40% from 55%. 06/10/2011  . Chronic bronchitis 05/23/2011  . Cardiomegaly - hypertensive 05/22/2011  . Marijuana use 05/22/2011  . Abdominal pain 05/22/2011  . Hypertension, malignant 05/22/2011  . Anxiety and depression 05/22/2011  . Hyperlipidemia LDL goal <70 08/26/2009  . Tobacco use 08/26/2009  . Headache(784.0) 08/26/2009  . Aortic valve disorder 03/11/2009  . INGUINAL HERNIA 02/18/2009  . Uncontrolled hypertension  01/16/2009    Past Surgical History:  Procedure Laterality Date  . ANKLE SURGERY Bilateral    Fractures bilaterally  . AORTIC VALVE SURGERY  09/2014  . CORONARY ANGIOGRAPHY N/A 12/06/2017   Procedure: CORONARY ANGIOGRAPHY;  Surgeon: Belva Crome, MD;  Location: [place] CV [facility];  Service: Cardiovascular;  Laterality: N/A;  . CORONARY STENT INTERVENTION N/A 12/10/2017   Procedure: CORONARY STENT INTERVENTION;  Surgeon: [name] Sine, MD;  Location: [place] CV [facility];  Service: Cardiovascular;  Laterality: N/A;  . FOOT FRACTURE SURGERY Bilateral 2004-2010   "got pins in both of them"  . HEMORRHOID SURGERY N/A 06/15/2015   Procedure: HEMORRHOIDECTOMY;  Surgeon: Stark Klein, MD;  Location: [facility];  Service: General;  Laterality: N/A;  . INGUINAL HERNIA REPAIR Right ~ 1996  . LEFT HEART CATHETERIZATION WITH CORONARY ANGIOGRAM N/A 06/21/2014   Procedure: LEFT HEART CATHETERIZATION WITH CORONARY ANGIOGRAM;  Surgeon: Larey Dresser, MD;  [facility] LAB;  Service: Cardiovascular;  Laterality: N/A;  . LOOP RECORDER  INSERTION N/A 06/19/2016   Procedure: Loop Recorder Insertion;  Surgeon: Deboraha Sprang, MD;  Location: [place] CV LAB;  Service: Cardiovascular;  Laterality: N/A;  . TEE WITHOUT CARDIOVERSION N/A 03/12/2016   Procedure: TRANSESOPHAGEAL ECHOCARDIOGRAM (TEE);  Surgeon: Sanda Klein, MD;  Location: [facility] ENDOSCOPY;  Service: Cardiovascular;  Laterality: N/A;        Home Medications    Prior to Admission medications   Medication Sig Start Date End Date Taking? Authorizing Provider  acetaminophen (TYLENOL) 325 MG tablet Take 650 mg by mouth every 6 (six) hours as needed (for pain or headaches).    Yes [provider]  amLODipine (NORVASC) 10 MG tablet Take 1 tablet (10 mg total) by mouth daily. 07/29/18  Yes Larey Dresser, MD  aspirin EC 81 MG EC tablet Take 1 tablet (81 mg total) by mouth daily. 12/12/17  Yes [facility], Tami Lin, PA  carvedilol (COREG) 25 MG tablet  Take 1 tablet (25 mg total) by mouth 2 (two) times daily with a meal. 12/11/17  Yes [facility], Tami Lin, PA  cloNIDine (CATAPRES - DOSED IN MG/24 HR) 0.3 mg/24hr patch Place 1 patch (0.3 mg total) onto the skin once a week. Patient taking differently: Place 0.3 mg onto the skin every Saturday.  09/23/18  Yes Larey Dresser, MD  ezetimibe (ZETIA) 10 MG tablet Take 1 tablet (10 mg total) by mouth daily. 08/03/18 11/01/18 Yes Larey Dresser, MD  furosemide (LASIX) 40 MG tablet Take one tablet daily as needed for shortness of breath or weight gain of more than 3 lbs in one day. Patient taking differently: Take 40 mg by mouth daily as needed (shortness of breath or weight gain of more than 3 lbs in one day).  12/11/17  Yes [facility], Tami Lin, PA  hydrALAZINE (APRESOLINE) 100 MG tablet Take 100 mg by mouth 2 (two) times daily.   Yes [provider]  pantoprazole (PROTONIX) 40 MG tablet Take 1 tablet (40 mg total) by mouth daily. 08/18/18  Yes Ladene Artist, MD  atorvastatin (LIPITOR) 80 MG tablet Take 1 tablet (80 mg total) by mouth daily. Patient not taking: Reported on 09/23/2018 12/11/17   Ledora Bottcher, PA  clopidogrel (PLAVIX) 75 MG tablet Take 1 tablet (75 mg total) by mouth daily with breakfast. Patient not taking: Reported on 09/23/2018 12/12/17   Ledora Bottcher, PA  gabapentin (NEURONTIN) 300 MG capsule Take 2 capsules (600 mg total) by mouth 3 (three) times daily. Needs office visit Patient not taking: Reported on 09/23/2018 04/05/18   Fulp, Ander Gaster, MD  rosuvastatin (CRESTOR) 40 MG tablet Take 1 tablet (40 mg total) by mouth daily. Patient not taking: Reported on 09/23/2018 05/09/18 08/07/18  Larey Dresser, MD    Family History Family History  Problem Relation Age of Onset  . Hypertension Mother   . Hypertension Other   . Colon cancer Paternal Uncle   . Stroke Maternal Aunt   . Heart attack Brother   . Diabetes Maternal Aunt   . Lung cancer Maternal Uncle   . Other  Sister        "breathing machine at night"  . Headache Sister   . Thyroid disease Sister   . Throat cancer Neg Hx   . Pancreatic cancer Neg Hx   . Esophageal cancer Neg Hx   . Kidney disease Neg Hx   . Liver disease Neg Hx     Social History Social History   Tobacco Use  .  Smoking status: Current Some Day Smoker    Packs/day: 0.25    Years: 23.00    Pack years: 5.75    Types: Cigarettes  . Smokeless tobacco: Never Used  . Tobacco comment: 3 to 4 per day  Substance Use Topics  . Alcohol use: Yes    Alcohol/week: 1.0 standard drinks    Types: 1 Cans of beer per week    Comment: occasionally  . Drug use: Yes    Types: Marijuana    Comment: 2 times/week     Allergies   Imdur [isosorbide nitrate] and Tramadol   Review of Systems Review of Systems  All other systems reviewed and are negative.    Physical Exam Updated Vital Signs BP (!) 199/107   Pulse 87   Temp 99 F (37.2 C) (Oral)   Resp 16   SpO2 100%   Physical Exam Vitals signs and nursing note reviewed.  Constitutional:      Appearance: He is well-developed.  HENT:     Head: Normocephalic and atraumatic.  Eyes:     Conjunctiva/sclera: Conjunctivae normal.  Neck:     Musculoskeletal: Neck supple.  Cardiovascular:     Rate and Rhythm: Normal rate and regular rhythm.     Heart sounds: No murmur.  Pulmonary:     Effort: Pulmonary effort is normal. No respiratory distress.     Breath sounds: Normal breath sounds.  Abdominal:     Palpations: Abdomen is soft.     Tenderness: There is no abdominal tenderness.  Musculoskeletal: Normal range of motion.     Comments: Moves all extremities  Skin:    General: Skin is warm and dry.  Neurological:     Mental Status: He is alert and oriented to person, place, and time.  Psychiatric:        Mood and Affect: Mood normal.        Behavior: Behavior normal.      ED Treatments / Results  Labs (all labs ordered are listed, but only abnormal results are  displayed) Labs Reviewed  BASIC METABOLIC PANEL - Abnormal; Notable for the following components:      Result Value   Creatinine, Ser 2.17 (*)    GFR calc non Af Amer 35 (*)    GFR calc Af Amer 41 (*)    All other components within normal limits  CBC - Abnormal; Notable for the following components:   Platelets 132 (*)    All other components within normal limits  TROPONIN I (HIGH SENSITIVITY) - Abnormal; Notable for the following components:   Troponin I (High Sensitivity) 53 (*)    All other components within normal limits  TROPONIN I (HIGH SENSITIVITY)    EKG None  Radiology Dg Chest 2 View  Result Date: 09/23/2018 CLINICAL DATA:  Chest pain EXAM: CHEST - 2 VIEW COMPARISON:  09/14/2018, 03/25/2018, CT 12/02/2017, radiograph 01/31/2017 FINDINGS: Post sternotomy changes with aortic valve and root prostheses. Similar appearance of thoracic aortic stent graft. Stable pleural and parenchymal scarring at the left base. No acute airspace disease or effusion. No pneumothorax. Stable mild cardiomegaly. IMPRESSION: No active cardiopulmonary disease. Stable postsurgical changes of the mediastinum with pleural and parenchymal scarring at the left base Electronically Signed   By: Donavan Foil M.D.   On: 09/23/2018 21:10    Procedures Procedures (including critical care time)  Medications Ordered in ED Medications  sodium chloride flush (NS) 0.9 % injection 3 mL (has no administration in time range)  morphine  4 MG/ML injection 4 mg (has no administration in time range)  aspirin chewable tablet 324 mg (has no administration in time range)  nitroGLYCERIN (NITROSTAT) SL tablet 0.4 mg (has no administration in time range)     Initial Impression / Assessment and Plan / ED Course  I have reviewed the triage vital signs and the nursing notes.  Pertinent labs & imaging results that were available during my care of the patient were reviewed by me and considered in my medical decision making  (see chart for details).        Patient with chest pain.  Has been ongoing for about a week with gradually worsening symptoms.  Troponin is elevated at 53, in a high risk patient indicating need for observation.  Initially ordered CT dissection due to hx, but received phone call from CT saying that creatinine too high for contrasted study.  Discussed with Dr. Tyrone Nine, who says that dissection unlikely due to prior endovascular repair.  Seen by and discussed with Dr. Tyrone Nine, who recommends giving labetalol for elevated BP and headache cocktail.  Agrees with plan for admission.    Will admit to medicine for monitoring.  Appreciate Dr. Alcario Drought for admitting.  Final Clinical Impressions(s) / ED Diagnoses   Final diagnoses:  Chest pain, unspecified type    ED Discharge Orders    None       Montine Circle, PA-C 09/24/18 0024    Deno Etienne, DO 10/03/18 1119

## 2018-09-23 NOTE — ED Notes (Signed)
Nurse drawing labs. 

## 2018-09-24 ENCOUNTER — Encounter (HOSPITAL_COMMUNITY): Payer: Self-pay | Admitting: *Deleted

## 2018-09-24 ENCOUNTER — Other Ambulatory Visit: Payer: Self-pay

## 2018-09-24 ENCOUNTER — Inpatient Hospital Stay (HOSPITAL_COMMUNITY): Payer: Medicare HMO

## 2018-09-24 ENCOUNTER — Emergency Department (HOSPITAL_COMMUNITY): Payer: Medicare HMO

## 2018-09-24 ENCOUNTER — Inpatient Hospital Stay (HOSPITAL_COMMUNITY)
Admission: EM | Admit: 2018-09-24 | Discharge: 2018-09-28 | DRG: 281 | Disposition: A | Discharge status: Home / Self Care | Payer: Medicare HMO | Attending: Internal Medicine | Admitting: Internal Medicine

## 2018-09-24 DIAGNOSIS — I10 Essential (primary) hypertension: Secondary | ICD-10-CM | POA: Diagnosis present

## 2018-09-24 DIAGNOSIS — I429 Cardiomyopathy, unspecified: Secondary | ICD-10-CM | POA: Diagnosis not present

## 2018-09-24 DIAGNOSIS — E785 Hyperlipidemia, unspecified: Secondary | ICD-10-CM | POA: Diagnosis present

## 2018-09-24 DIAGNOSIS — I43 Cardiomyopathy in diseases classified elsewhere: Secondary | ICD-10-CM | POA: Diagnosis present

## 2018-09-24 DIAGNOSIS — Z823 Family history of stroke: Secondary | ICD-10-CM | POA: Diagnosis not present

## 2018-09-24 DIAGNOSIS — R402142 Coma scale, eyes open, spontaneous, at arrival to emergency department: Secondary | ICD-10-CM | POA: Diagnosis present

## 2018-09-24 DIAGNOSIS — Z954 Presence of other heart-valve replacement: Secondary | ICD-10-CM

## 2018-09-24 DIAGNOSIS — Z800 Family history of malignant neoplasm of digestive organs: Secondary | ICD-10-CM

## 2018-09-24 DIAGNOSIS — Z8249 Family history of ischemic heart disease and other diseases of the circulatory system: Secondary | ICD-10-CM | POA: Diagnosis not present

## 2018-09-24 DIAGNOSIS — R079 Chest pain, unspecified: Secondary | ICD-10-CM | POA: Diagnosis present

## 2018-09-24 DIAGNOSIS — Z833 Family history of diabetes mellitus: Secondary | ICD-10-CM

## 2018-09-24 DIAGNOSIS — Z8679 Personal history of other diseases of the circulatory system: Secondary | ICD-10-CM

## 2018-09-24 DIAGNOSIS — I214 Non-ST elevation (NSTEMI) myocardial infarction: Secondary | ICD-10-CM | POA: Diagnosis present

## 2018-09-24 DIAGNOSIS — I255 Ischemic cardiomyopathy: Secondary | ICD-10-CM | POA: Diagnosis present

## 2018-09-24 DIAGNOSIS — I130 Hypertensive heart and chronic kidney disease with heart failure and stage 1 through stage 4 chronic kidney disease, or unspecified chronic kidney disease: Secondary | ICD-10-CM | POA: Diagnosis present

## 2018-09-24 DIAGNOSIS — Z20828 Contact with and (suspected) exposure to other viral communicable diseases: Secondary | ICD-10-CM | POA: Diagnosis present

## 2018-09-24 DIAGNOSIS — I5042 Chronic combined systolic (congestive) and diastolic (congestive) heart failure: Secondary | ICD-10-CM | POA: Diagnosis present

## 2018-09-24 DIAGNOSIS — Z95818 Presence of other cardiac implants and grafts: Secondary | ICD-10-CM | POA: Diagnosis not present

## 2018-09-24 DIAGNOSIS — Z8701 Personal history of pneumonia (recurrent): Secondary | ICD-10-CM

## 2018-09-24 DIAGNOSIS — N179 Acute kidney failure, unspecified: Secondary | ICD-10-CM | POA: Diagnosis present

## 2018-09-24 DIAGNOSIS — Z955 Presence of coronary angioplasty implant and graft: Secondary | ICD-10-CM

## 2018-09-24 DIAGNOSIS — Z7982 Long term (current) use of aspirin: Secondary | ICD-10-CM | POA: Diagnosis not present

## 2018-09-24 DIAGNOSIS — R402362 Coma scale, best motor response, obeys commands, at arrival to emergency department: Secondary | ICD-10-CM | POA: Diagnosis present

## 2018-09-24 DIAGNOSIS — I119 Hypertensive heart disease without heart failure: Secondary | ICD-10-CM | POA: Diagnosis present

## 2018-09-24 DIAGNOSIS — I25119 Atherosclerotic heart disease of native coronary artery with unspecified angina pectoris: Secondary | ICD-10-CM

## 2018-09-24 DIAGNOSIS — N183 Chronic kidney disease, stage 3 (moderate): Secondary | ICD-10-CM | POA: Diagnosis present

## 2018-09-24 DIAGNOSIS — N170 Acute kidney failure with tubular necrosis: Secondary | ICD-10-CM | POA: Diagnosis not present

## 2018-09-24 DIAGNOSIS — I2510 Atherosclerotic heart disease of native coronary artery without angina pectoris: Secondary | ICD-10-CM | POA: Diagnosis present

## 2018-09-24 DIAGNOSIS — Z79899 Other long term (current) drug therapy: Secondary | ICD-10-CM | POA: Diagnosis not present

## 2018-09-24 DIAGNOSIS — F1920 Other psychoactive substance dependence, uncomplicated: Secondary | ICD-10-CM | POA: Diagnosis not present

## 2018-09-24 DIAGNOSIS — K219 Gastro-esophageal reflux disease without esophagitis: Secondary | ICD-10-CM | POA: Diagnosis present

## 2018-09-24 DIAGNOSIS — I160 Hypertensive urgency: Secondary | ICD-10-CM | POA: Diagnosis present

## 2018-09-24 DIAGNOSIS — N184 Chronic kidney disease, stage 4 (severe): Secondary | ICD-10-CM | POA: Diagnosis present

## 2018-09-24 DIAGNOSIS — Z8673 Personal history of transient ischemic attack (TIA), and cerebral infarction without residual deficits: Secondary | ICD-10-CM

## 2018-09-24 DIAGNOSIS — I428 Other cardiomyopathies: Secondary | ICD-10-CM | POA: Diagnosis present

## 2018-09-24 DIAGNOSIS — R109 Unspecified abdominal pain: Secondary | ICD-10-CM | POA: Diagnosis present

## 2018-09-24 DIAGNOSIS — F1290 Cannabis use, unspecified, uncomplicated: Secondary | ICD-10-CM | POA: Diagnosis present

## 2018-09-24 DIAGNOSIS — F17210 Nicotine dependence, cigarettes, uncomplicated: Secondary | ICD-10-CM | POA: Diagnosis present

## 2018-09-24 DIAGNOSIS — G8929 Other chronic pain: Secondary | ICD-10-CM | POA: Diagnosis present

## 2018-09-24 DIAGNOSIS — Z952 Presence of prosthetic heart valve: Secondary | ICD-10-CM

## 2018-09-24 DIAGNOSIS — R402252 Coma scale, best verbal response, oriented, at arrival to emergency department: Secondary | ICD-10-CM | POA: Diagnosis present

## 2018-09-24 DIAGNOSIS — Z8349 Family history of other endocrine, nutritional and metabolic diseases: Secondary | ICD-10-CM

## 2018-09-24 DIAGNOSIS — E7800 Pure hypercholesterolemia, unspecified: Secondary | ICD-10-CM | POA: Diagnosis not present

## 2018-09-24 DIAGNOSIS — Z801 Family history of malignant neoplasm of trachea, bronchus and lung: Secondary | ICD-10-CM

## 2018-09-24 DIAGNOSIS — Z7902 Long term (current) use of antithrombotics/antiplatelets: Secondary | ICD-10-CM

## 2018-09-24 LAB — BASIC METABOLIC PANEL
Anion gap: 13 (ref 5–15)
BUN: 21 mg/dL — ABNORMAL HIGH (ref 6–20)
CO2: 21 mmol/L — ABNORMAL LOW (ref 22–32)
Calcium: 9.1 mg/dL (ref 8.9–10.3)
Chloride: 104 mmol/L (ref 98–111)
Creatinine, Ser: 2.05 mg/dL — ABNORMAL HIGH (ref 0.61–1.24)
GFR calc Af Amer: 44 mL/min — ABNORMAL LOW (ref >60)
GFR calc non Af Amer: 38 mL/min — ABNORMAL LOW (ref >60)
Glucose, Bld: 79 mg/dL (ref 70–99)
Potassium: 3.7 mmol/L (ref 3.5–5.1)
Sodium: 138 mmol/L (ref 135–145)

## 2018-09-24 LAB — TROPONIN I (HIGH SENSITIVITY)
Troponin I (High Sensitivity): 79 ng/L — ABNORMAL HIGH (ref <18)
Troponin I (High Sensitivity): 81 ng/L — ABNORMAL HIGH (ref <18)
Troponin I (High Sensitivity): 194 ng/L (ref <18)
Troponin I (High Sensitivity): 848 ng/L (ref <18)
Troponin I (High Sensitivity): 878 ng/L (ref <18)

## 2018-09-24 LAB — SARS CORONAVIRUS 2 BY RT PCR (HOSPITAL ORDER, PERFORMED IN ~~LOC~~ HOSPITAL LAB): SARS Coronavirus 2: NEGATIVE

## 2018-09-24 LAB — CBC
HCT: 46.9 % (ref 39.0–52.0)
Hemoglobin: 15.1 g/dL (ref 13.0–17.0)
MCH: 31.2 pg (ref 26.0–34.0)
MCHC: 32.2 g/dL (ref 30.0–36.0)
MCV: 96.9 fL (ref 80.0–100.0)
Platelets: 123 10*3/uL — ABNORMAL LOW (ref 150–400)
RBC: 4.84 MIL/uL (ref 4.22–5.81)
RDW: 14.1 % (ref 11.5–15.5)
WBC: 7 10*3/uL (ref 4.0–10.5)
nRBC: 0 % (ref 0.0–0.2)

## 2018-09-24 MED ORDER — IOHEXOL 350 MG/ML SOLN
100.0000 mL | Freq: Once | INTRAVENOUS | Status: AC | PRN
  Administered 2018-09-24: 80 mL via INTRAVENOUS

## 2018-09-24 MED ORDER — SODIUM CHLORIDE (PF) 0.9 % IJ SOLN
INTRAMUSCULAR | Status: AC
  Filled 2018-09-24: qty 100

## 2018-09-24 MED ORDER — CARVEDILOL 12.5 MG PO TABS
25.0000 mg | ORAL_TABLET | Freq: Two times a day (BID) | ORAL | Status: DC
  Administered 2018-09-24: 25 mg via ORAL
  Filled 2018-09-24: qty 2

## 2018-09-24 MED ORDER — CLOPIDOGREL BISULFATE 75 MG PO TABS
75.0000 mg | ORAL_TABLET | Freq: Every day | ORAL | Status: DC
  Administered 2018-09-24: 75 mg via ORAL
  Filled 2018-09-24 (×2): qty 1

## 2018-09-24 MED ORDER — EZETIMIBE 10 MG PO TABS
10.0000 mg | ORAL_TABLET | Freq: Every day | ORAL | Status: DC
  Administered 2018-09-24: 09:00:00 10 mg via ORAL
  Filled 2018-09-24: qty 1

## 2018-09-24 MED ORDER — ENOXAPARIN SODIUM 40 MG/0.4ML ~~LOC~~ SOLN: 40.0000 mg | SUBCUTANEOUS | Status: DC

## 2018-09-24 MED ORDER — AMLODIPINE BESYLATE 10 MG PO TABS
10.0000 mg | ORAL_TABLET | Freq: Every day | ORAL | Status: DC
  Administered 2018-09-24 – 2018-09-28 (×5): 10 mg via ORAL
  Filled 2018-09-24 (×3): qty 1
  Filled 2018-09-24: qty 2
  Filled 2018-09-24: qty 1

## 2018-09-24 MED ORDER — CARVEDILOL 25 MG PO TABS
25.0000 mg | ORAL_TABLET | Freq: Two times a day (BID) | ORAL | Status: DC
  Administered 2018-09-24 – 2018-09-28 (×8): 25 mg via ORAL
  Filled 2018-09-24 (×9): qty 1

## 2018-09-24 MED ORDER — POLYETHYLENE GLYCOL 3350 17 G PO PACK
17.0000 g | PACK | Freq: Every day | ORAL | Status: DC | PRN
  Administered 2018-09-27: 17 g via ORAL
  Filled 2018-09-24: qty 1

## 2018-09-24 MED ORDER — EZETIMIBE 10 MG PO TABS
10.0000 mg | ORAL_TABLET | Freq: Every day | ORAL | Status: DC
  Administered 2018-09-25 – 2018-09-28 (×4): 10 mg via ORAL
  Filled 2018-09-24 (×4): qty 1

## 2018-09-24 MED ORDER — HYDROCODONE-ACETAMINOPHEN 5-325 MG PO TABS
1.0000 | ORAL_TABLET | ORAL | Status: DC | PRN
  Administered 2018-09-25 – 2018-09-27 (×6): 2 via ORAL
  Filled 2018-09-24 (×6): qty 2

## 2018-09-24 MED ORDER — ACETAMINOPHEN 325 MG PO TABS
650.0000 mg | ORAL_TABLET | ORAL | Status: DC | PRN
  Administered 2018-09-24: 09:00:00 650 mg via ORAL
  Filled 2018-09-24: qty 2

## 2018-09-24 MED ORDER — ACETAMINOPHEN 325 MG PO TABS: 650.0000 mg | ORAL_TABLET | Freq: Four times a day (QID) | ORAL | Status: DC | PRN

## 2018-09-24 MED ORDER — HYDRALAZINE HCL 20 MG/ML IJ SOLN
20.0000 mg | INTRAMUSCULAR | Status: DC | PRN
  Administered 2018-09-24 (×2): 20 mg via INTRAVENOUS
  Filled 2018-09-24 (×2): qty 1

## 2018-09-24 MED ORDER — ROSUVASTATIN CALCIUM 20 MG PO TABS
40.0000 mg | ORAL_TABLET | Freq: Every day | ORAL | Status: DC
  Administered 2018-09-25 – 2018-09-28 (×4): 40 mg via ORAL
  Filled 2018-09-24: qty 2
  Filled 2018-09-24: qty 1
  Filled 2018-09-24: qty 2
  Filled 2018-09-24: qty 1
  Filled 2018-09-24 (×2): qty 2
  Filled 2018-09-24: qty 1

## 2018-09-24 MED ORDER — HEPARIN BOLUS VIA INFUSION
4000.0000 [IU] | Freq: Once | INTRAVENOUS | Status: AC
  Administered 2018-09-24: 4000 [IU] via INTRAVENOUS
  Filled 2018-09-24: qty 4000

## 2018-09-24 MED ORDER — ONDANSETRON HCL 4 MG/2ML IJ SOLN: 4.0000 mg | Freq: Four times a day (QID) | INTRAMUSCULAR | Status: DC | PRN

## 2018-09-24 MED ORDER — ASPIRIN EC 81 MG PO TBEC
81.0000 mg | DELAYED_RELEASE_TABLET | Freq: Every day | ORAL | Status: DC
  Administered 2018-09-24: 81 mg via ORAL
  Filled 2018-09-24: qty 1

## 2018-09-24 MED ORDER — AMLODIPINE BESYLATE 5 MG PO TABS
10.0000 mg | ORAL_TABLET | Freq: Every day | ORAL | Status: DC
  Administered 2018-09-24: 10 mg via ORAL
  Filled 2018-09-24: qty 2

## 2018-09-24 MED ORDER — PANTOPRAZOLE SODIUM 40 MG PO TBEC
40.0000 mg | DELAYED_RELEASE_TABLET | Freq: Every day | ORAL | Status: DC
  Administered 2018-09-24: 40 mg via ORAL
  Filled 2018-09-24: qty 1

## 2018-09-24 MED ORDER — CLONIDINE HCL 0.3 MG/24HR TD PTWK
0.3000 mg | MEDICATED_PATCH | TRANSDERMAL | Status: DC
  Administered 2018-09-24: 0.3 mg via TRANSDERMAL
  Filled 2018-09-24: qty 1

## 2018-09-24 MED ORDER — SODIUM CHLORIDE 0.9% FLUSH: 3.0000 mL | Freq: Once | INTRAVENOUS | Status: DC

## 2018-09-24 MED ORDER — SODIUM CHLORIDE 0.9 % IV SOLN: INTRAVENOUS | Status: DC

## 2018-09-24 MED ORDER — ASPIRIN EC 81 MG PO TBEC
81.0000 mg | DELAYED_RELEASE_TABLET | Freq: Every day | ORAL | Status: DC
  Administered 2018-09-25 – 2018-09-28 (×4): 81 mg via ORAL
  Filled 2018-09-24 (×7): qty 1

## 2018-09-24 MED ORDER — NITROGLYCERIN 2 % TD OINT
0.5000 [in_us] | TOPICAL_OINTMENT | Freq: Four times a day (QID) | TRANSDERMAL | Status: DC
  Administered 2018-09-24 – 2018-09-25 (×4): 0.5 [in_us] via TOPICAL
  Filled 2018-09-24: qty 1
  Filled 2018-09-24: qty 30

## 2018-09-24 MED ORDER — MORPHINE SULFATE (PF) 4 MG/ML IV SOLN
6.0000 mg | Freq: Once | INTRAVENOUS | Status: AC
  Administered 2018-09-24: 14:00:00 6 mg via INTRAVENOUS
  Filled 2018-09-24: qty 2

## 2018-09-24 MED ORDER — HYDRALAZINE HCL 50 MG PO TABS
100.0000 mg | ORAL_TABLET | Freq: Two times a day (BID) | ORAL | Status: DC
  Administered 2018-09-24: 100 mg via ORAL
  Filled 2018-09-24 (×2): qty 2

## 2018-09-24 MED ORDER — ROSUVASTATIN CALCIUM 20 MG PO TABS
40.0000 mg | ORAL_TABLET | Freq: Every day | ORAL | Status: DC
  Administered 2018-09-24: 40 mg via ORAL
  Filled 2018-09-24: qty 2

## 2018-09-24 MED ORDER — ENOXAPARIN SODIUM 40 MG/0.4ML ~~LOC~~ SOLN
40.0000 mg | SUBCUTANEOUS | Status: DC
  Administered 2018-09-24: 40 mg via SUBCUTANEOUS
  Filled 2018-09-24: qty 0.4

## 2018-09-24 MED ORDER — HYDRALAZINE HCL 20 MG/ML IJ SOLN: 20.0000 mg | INTRAMUSCULAR | Status: DC | PRN

## 2018-09-24 MED ORDER — ONDANSETRON HCL 4 MG PO TABS: 4.0000 mg | ORAL_TABLET | Freq: Four times a day (QID) | ORAL | Status: DC | PRN

## 2018-09-24 MED ORDER — PANTOPRAZOLE SODIUM 40 MG PO TBEC
40.0000 mg | DELAYED_RELEASE_TABLET | Freq: Every day | ORAL | Status: DC
  Administered 2018-09-25 – 2018-09-28 (×4): 40 mg via ORAL
  Filled 2018-09-24 (×3): qty 1

## 2018-09-24 MED ORDER — ACETAMINOPHEN 650 MG RE SUPP: 650.0000 mg | Freq: Four times a day (QID) | RECTAL | Status: DC | PRN

## 2018-09-24 MED ORDER — HEPARIN (PORCINE) 25000 UT/250ML-% IV SOLN
1200.0000 [IU]/h | INTRAVENOUS | Status: DC
  Administered 2018-09-24: 850 [IU]/h via INTRAVENOUS
  Administered 2018-09-25: 1050 [IU]/h via INTRAVENOUS
  Filled 2018-09-24 (×2): qty 250

## 2018-09-24 MED ORDER — LABETALOL HCL 5 MG/ML IV SOLN
10.0000 mg | INTRAVENOUS | Status: DC | PRN
  Administered 2018-09-24 – 2018-09-28 (×5): 10 mg via INTRAVENOUS
  Filled 2018-09-24 (×4): qty 4

## 2018-09-24 MED ORDER — ACETAMINOPHEN 325 MG PO TABS
650.0000 mg | ORAL_TABLET | Freq: Four times a day (QID) | ORAL | Status: DC | PRN
  Administered 2018-09-25: 650 mg via ORAL
  Filled 2018-09-24 (×2): qty 2

## 2018-09-24 NOTE — ED Notes (Signed)
Dr. Zenia Resides is aware of Troponin.

## 2018-09-24 NOTE — Progress Notes (Signed)
Pt left facility after signing AMA, stated he wanted to go to another facility.

## 2018-09-24 NOTE — Progress Notes (Addendum)
We were asked to see [name] in  consultation of chest pain.  He has a complicated past medical history with type B aortic dissection status post endoluminal stent grafting and ramus branch intervention by Dr. Claiborne Billings a year ago with very difficult anatomy.  I reviewed the films thoroughly.  This was a difficult intervention.  [name] has had chest pain and headache.  His EKG shows LVH with repolarization changes unchanged from prior EKGs.  He has moderately severe LV dysfunction.  His enzymes were initially flat but have risen.  On initial discussion I told him he had a small heart attack.  The patient wishes to leave the hospital.  He would be leaving AMA.  He voiced desire to go to [facility].  I have told him that he should be evaluated here at [facility] but he does not wish to do so.   Lorretta Harp, M.D., [place], [facility], [place] [address] [place], [place]  [id number]  [phone] 09/24/2018 11:49 AM

## 2018-09-24 NOTE — H&P (Signed)
History and Physical  [name] MLY:[id number] DOB: November 22, 1970 DOA: 09/24/2018   Patient coming from: Home & is able to ambulate  Chief Complaint: Chest pain  HPI: [name] is a 48 y.o. male with medical history significant for aortic dissection status post endoluminal stent grafting about a year ago, CAD, NICM, CHF, CKD, tobacco abuse, presents to the ED complaining of chest pain and headache.  Patient initially presented to [facility] early hours this morning, and signed AMA, later came back to [facility] long hospital ER having similar complains.  Patient reported left-sided chest pain radiating to his left arm, goes up to his left side of the neck and left side of the head, with some numbness in his arm. Patient reports symptoms have been going on for few weeks now, but was scared to come to the hospital due to COVID-19.  Patient denies any nausea/vomiting, shortness of breath, abdominal pain, fever/chills, cough.  Prior to leaving [facility], patient was advised that his troponins that were initially flat was rising, his blood pressure was poorly controlled, but patient insisted on leaving AMA and wanted to be transferred to [facility].  ED Course: Here at [facility], troponin continued to rise, noted to be in hypertensive crisis, labs showed creatinine of 2.05, EKG with no acute ST changes.  Cardiology was consulted again, recommended that patient be transferred to [facility].  [facility] hospital[facility] called for admission.  Review of Systems: Review of systems are otherwise negative   Past Medical History:  Diagnosis Date  . Adenomatous colon polyp 08/2015  . Anxiety   . Aortic disease ([place])   . Aortic dissection (HCC)    a. admx 04/2014 >> L renal infarct; a/c renal failure >> b.  s/p Bioprosthetic Bentall and total arch replacement and staged endovascular repair of descending aortic aneurysm ([facility] - Dr. [name])  . CAD (coronary artery disease)    a. LHC 4/16:  oD1 60%  .  Cardiomyopathy ([place])    a. non-ischemic - probably related to untreated HTN and ETOH abuse - Echo 3/13 with EF 35-40% >> b. Echo 4/16: Severe LVH, EF 55-60%, moderate AI, moderate MR, mild LAE, trivial effusion, known type B dissection with communication between true and false lumens with suprasternal images suggesting dissection plane may propagate to at least left subclavian takeoff, root above aortic valve okay    . Chronic abdominal pain   . Chronic combined systolic and diastolic congestive heart failure ([place])    a. 05/2011: Adm with pulm edema/HTN urgency, EF 35-40% with diffuse hypokinesis and moderate to severe mitral regurgitation. Cardiomyopathy likely due to uncontrolled HTN and ETOH abuse - cath deferred due to renal insufficiency (felt due to uncontrolled HTN). bJodie Echevaria MV 06/2011: EF 37% and no ischemia or infarction. c. EF 45-50% by echo 01/2012.  [name] Chronic sinusitis   . CKD (chronic kidney disease)    a. 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  peak creatinine 3.46 during admx for aortic dissection 2/16.  sees Dr [name]  . DDD (degenerative disc disease), lumbar   . Depression   . Descending thoracic aortic aneurysm ([place])   . Dissecting aneurysm of thoracic aorta ([place])   . ETOH abuse    a. Reported to have quit 05/2011.  . Frequent headaches   . GERD (gastroesophageal reflux disease)   . Headache(784.0)    "q other day" (08/08/2013)  . Heart murmur   . Hemorrhoid thrombosis   . History of echocardiogram    Echo  1/17:  Severe LVH, EF 55-60%, no RWMA, Gr 2 DD, AVR ok, mild to mod MR, mild LAE, mild reduced RVSF, mod RAE  . History of medication noncompliance   . HYPERLIPIDEMIA   . Hypertension    a. Hx of HTN urgency secondary to noncompliance. b. urinary metanephrine and catecholeamine levels normal 2013.  c. Renal art [name] 1/16:  No evidence of renal artery stenosis noted bilaterally.  . INGUINAL HERNIA   . Pneumonia ~ 2013  . Renal insufficiency   . Serrated adenoma of colon 08/2015   . Stroke ([place])   . Tobacco abuse   . Valvular heart disease    a. Echo 05/2011: moderate to severe eccentric MR and mild to moderate AI with prolapsing left coronary cusp. b. Echo 01/2012: mild-mod AI, mild dilitation of aortic root, mild MR.;  c. Echo 1/16: Severe LVH consistent with hypertrophic cardio myopathy, EF 50%, no RWMA, mod AI, mild MR, mild RAE, dilated Ao root (40 mm);    Past Surgical History:  Procedure Laterality Date  . ANKLE SURGERY Bilateral    Fractures bilaterally  . AORTIC VALVE SURGERY  09/2014  . CORONARY ANGIOGRAPHY N/A 12/06/2017   Procedure: CORONARY ANGIOGRAPHY;  Surgeon: Belva Crome, MD;  Location: [place] CV LAB;  Service: Cardiovascular;  Laterality: N/A;  . CORONARY STENT INTERVENTION N/A 12/10/2017   Procedure: CORONARY STENT INTERVENTION;  Surgeon: [name] Sine, MD;  Location: [place] CV LAB;  Service: Cardiovascular;  Laterality: N/A;  . FOOT FRACTURE SURGERY Bilateral 2004-2010   "got pins in both of them"  . HEMORRHOID SURGERY N/A 06/15/2015   Procedure: HEMORRHOIDECTOMY;  Surgeon: Stark Klein, MD;  Location: [place];  Service: General;  Laterality: N/A;  . INGUINAL HERNIA REPAIR Right ~ 1996  . LEFT HEART CATHETERIZATION WITH CORONARY ANGIOGRAM N/A 06/21/2014   Procedure: LEFT HEART CATHETERIZATION WITH CORONARY ANGIOGRAM;  Surgeon: Larey Dresser, MD;  Location: [facility] CATH LAB;  Service: Cardiovascular;  Laterality: N/A;  . LOOP RECORDER INSERTION N/A 06/19/2016   Procedure: Loop Recorder Insertion;  Surgeon: Deboraha Sprang, MD;  Location: [place] CV LAB;  Service: Cardiovascular;  Laterality: N/A;  . TEE WITHOUT CARDIOVERSION N/A 03/12/2016   Procedure: TRANSESOPHAGEAL ECHOCARDIOGRAM (TEE);  Surgeon: Sanda Klein, MD;  Location: [facility] ENDOSCOPY;  Service: Cardiovascular;  Laterality: N/A;    Social History:  reports that he has been smoking cigarettes. He has a 5.75 pack-year smoking history. He has never used smokeless tobacco. He reports  current alcohol use of about 1.0 standard drinks of alcohol per week. He reports current drug use. Drug: Marijuana.   Allergies  Allergen Reactions  . Imdur [Isosorbide Nitrate] Other (See Comments)    Headaches   . Tramadol Other (See Comments)    Headaches     Family History  Problem Relation Age of Onset  . Hypertension Mother   . Hypertension Other   . Colon cancer Paternal Uncle   . Stroke Maternal Aunt   . Heart attack Brother   . Diabetes Maternal Aunt   . Lung cancer Maternal Uncle   . Other Sister        "breathing machine at night"  . Headache Sister   . Thyroid disease Sister   . Throat cancer Neg Hx   . Pancreatic cancer Neg Hx   . Esophageal cancer Neg Hx   . Kidney disease Neg Hx   . Liver disease Neg Hx       Prior to Admission medications  Medication Sig Start Date End Date Taking? Authorizing Provider  hydrALAZINE (APRESOLINE) 100 MG tablet Take 100 mg by mouth 2 (two) times daily.   Yes [provider]  acetaminophen (TYLENOL) 325 MG tablet Take 650 mg by mouth every 6 (six) hours as needed (for pain or headaches).     [provider]  amLODipine (NORVASC) 10 MG tablet Take 1 tablet (10 mg total) by mouth daily. 07/29/18   Larey Dresser, MD  aspirin EC 81 MG EC tablet Take 1 tablet (81 mg total) by mouth daily. 12/12/17   [facility], Tami Lin, PA  carvedilol (COREG) 25 MG tablet Take 1 tablet (25 mg total) by mouth 2 (two) times daily with a meal. 12/11/17   [facility], Tami Lin, PA  cloNIDine (CATAPRES - DOSED IN MG/24 HR) 0.3 mg/24hr patch Place 1 patch (0.3 mg total) onto the skin once a week. Patient taking differently: Place 0.3 mg onto the skin every Saturday.  09/23/18   Larey Dresser, MD  clopidogrel (PLAVIX) 75 MG tablet Take 1 tablet (75 mg total) by mouth daily with breakfast. Patient not taking: Reported on 09/23/2018 12/12/17   Ledora Bottcher, PA  ezetimibe (ZETIA) 10 MG tablet Take 1 tablet (10 mg total) by mouth  daily. 08/03/18 11/01/18  Larey Dresser, MD  furosemide (LASIX) 40 MG tablet Take one tablet daily as needed for shortness of breath or weight gain of more than 3 lbs in one day. Patient taking differently: Take 40 mg by mouth daily as needed (shortness of breath or weight gain of more than 3 lbs in one day).  12/11/17   [facility], Tami Lin, PA  pantoprazole (PROTONIX) 40 MG tablet Take 1 tablet (40 mg total) by mouth daily. 08/18/18   Ladene Artist, MD  rosuvastatin (CRESTOR) 40 MG tablet Take 1 tablet (40 mg total) by mouth daily. 05/09/18 11/10/18  Larey Dresser, MD    Physical Exam: BP (!) 185/102   Pulse 60   Resp (!) 21   SpO2 98%   General: NAD Eyes: Normal ENT: Normal Neck: Supple Cardiovascular: S1, S2 present Respiratory: CTAB Abdomen: Soft, nontender, nondistended, bowel sounds present Skin: Normal Musculoskeletal: No pedal edema bilaterally Psychiatric: Normal mood Neurologic: No focal neurologic deficit noted          Labs on Admission:  Basic Metabolic Panel: Recent Labs  Lab 09/23/18 2050 09/24/18 1341  NA 141 138  K 3.6 3.7  CL 109 104  CO2 22 21*  GLUCOSE 89 79  BUN 16 21*  CREATININE 2.17* 2.05*  CALCIUM 9.2 9.1   Liver Function Tests: No results for input(s): AST, ALT, ALKPHOS, BILITOT, PROT, ALBUMIN in the last 168 hours. No results for input(s): LIPASE, AMYLASE in the last 168 hours. No results for input(s): AMMONIA in the last 168 hours. CBC: Recent Labs  Lab 09/23/18 2050 09/24/18 1341  WBC 6.8 7.0  HGB 14.5 15.1  HCT 43.5 46.9  MCV 94.4 96.9  PLT 132* 123*   Cardiac Enzymes: No results for input(s): CKTOTAL, CKMB, CKMBINDEX, TROPONINI in the last 168 hours.  BNP (last 3 results) No results for input(s): BNP in the last 8760 hours.  ProBNP (last 3 results) No results for input(s): PROBNP in the last 8760 hours.  CBG: No results for input(s): GLUCAP in the last 168 hours.  Radiological Exams on Admission: Dg Chest 2 View   Result Date: 09/24/2018 CLINICAL DATA:  Chest pain, headache, hypertension EXAM: CHEST - 2  VIEW COMPARISON:  09/23/2018 FINDINGS: Unchanged examination. No acute abnormality of the lungs. Cardiomegaly status post median sternotomy with surgical aortic annulus and extensive multi component stent endograft repair of the aorta. IMPRESSION: Unchanged examination. No acute abnormality of the lungs. Cardiomegaly status post median sternotomy with surgical aortic annulus and extensive multi component stent endograft repair of the aorta. Electronically Signed   By: Eddie Candle M.D.   On: 09/24/2018 13:10   Dg Chest 2 View  Result Date: 09/23/2018 CLINICAL DATA:  Chest pain EXAM: CHEST - 2 VIEW COMPARISON:  09/14/2018, 03/25/2018, CT 12/02/2017, radiograph 01/31/2017 FINDINGS: Post sternotomy changes with aortic valve and root prostheses. Similar appearance of thoracic aortic stent graft. Stable pleural and parenchymal scarring at the left base. No acute airspace disease or effusion. No pneumothorax. Stable mild cardiomegaly. IMPRESSION: No active cardiopulmonary disease. Stable postsurgical changes of the mediastinum with pleural and parenchymal scarring at the left base Electronically Signed   By: Donavan Foil M.D.   On: 09/23/2018 21:10   Ct Angio Chest/abd/pel For Dissection W And/or W/wo  Result Date: 09/24/2018 CLINICAL DATA:  History of aortic dissection with repair EXAM: CT ANGIOGRAPHY CHEST, ABDOMEN AND PELVIS TECHNIQUE: Multidetector CT imaging through the chest, abdomen and pelvis was performed using the standard protocol during bolus administration of intravenous contrast. Multiplanar reconstructed images and MIPs were obtained and reviewed to evaluate the vascular anatomy. CONTRAST:  73mL OMNIPAQUE 350 COMPARISON:  12/02/2017 FINDINGS: CTA CHEST FINDINGS Cardiovascular: Aortic valve replacement is again identified and stable in appearance. Aortic root is stable in appearance measuring approximately  4.2 cm in greatest dimension. The ascending aortic graft is again identified with a stable appearance with Elephant graft giving rise to the brachiocephalic vessels. No focal narrowing is noted. Just beyond the Elephant graft, there is evidence of stent graft placement extending through the aortic arch and into the descending thoracic aorta. No endoleak is identified. The pulmonary artery appears within normal limits although opacification is limited with regards to pulmonary embolism. No pericardial effusion is seen. No other focal abnormality is noted. Mediastinum/Nodes: Thoracic inlet shows no acute abnormality. No sizable hilar or mediastinal adenopathy is noted. The esophagus as visualized is within normal limits. Lungs/Pleura: Lungs are well aerated bilaterally. Right lung shows no focal infiltrate or sizable effusion. Mild scarring remains at the left lung base similar to that seen on prior exam. No parenchymal nodules are seen. No pneumothorax is noted. Musculoskeletal: Degenerative changes of the thoracic spine are noted. Postsurgical changes consistent with median sternotomy are seen. No acute rib abnormality is noted. Review of the MIP images confirms the above findings. CTA ABDOMEN AND PELVIS FINDINGS VASCULAR Aorta: The known thoracic aortic stent graft extends into the proximal abdominal aorta. A an implanted graft to the celiac axis is identified with patency of the branches of the celiac axis. Additionally graft to the superior mesenteric artery is noted as well. Tortuous graft to the left renal artery is noted. A more inline graft to the right renal artery is noted as well. These are patent. 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 No enhancement is identified. No recurrent dissection is seen. Calcifications of the more distal aorta are noted. Celiac: Widely patent from graft arising from the  proximal aorta. SMA: Widely patent from graft arising from the proximal aorta. Renals: Single renal arteries are identified following grafting to the proximal aorta. IMA: Patent without evidence  of aneurysm, dissection, vasculitis or significant stenosis. Iliacs: Atherosclerotic calcifications of the iliac vessels are noted. No focal stenosis is seen. 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 Veins: No venous abnormality is noted. Review of the MIP images confirms the above findings. NON-VASCULAR Hepatobiliary: No focal liver abnormality is seen. No gallstones, gallbladder wall thickening, or biliary dilatation. Pancreas: Unremarkable. No pancreatic ductal dilatation or surrounding inflammatory changes. Spleen: Normal in size without focal abnormality. Adrenals/Urinary Tract: Adrenal glands are within normal limits. Kidneys demonstrate a normal enhancement pattern bilaterally. No obstructive changes are seen. Mild scarring is noted in the left kidney stable from the previous exam. The bladder is well distended. Stomach/Bowel: The appendix is within normal limits. No obstructive or inflammatory changes of large or small bowel is noted. Stomach is within normal limits. Lymphatic: No lymphatic abnormality is noted. Reproductive: Prostate is unremarkable. Other: No abdominal wall hernia or abnormality. No abdominopelvic ascites. Musculoskeletal: No acute or significant osseous findings. Review of the MIP images confirms the above findings. IMPRESSION: Stable postoperative appearance when compared with the prior exam. Extensive reconstruction of the ascending aorta is seen with aortic valve and elephant graft supplying the brachiocephalic vessels. Thoracic stent graft is noted in place without evidence of endoleak. Extensive postoperative change in the proximal abdominal aorta with patent C2 all of the visceral grafts identified. The previously seen aneurysmal portion of the native  abdominal aorta is again identified and thrombosed also stable from the prior exam. No acute dissection is seen. No areas of focal hemorrhage are identified. No acute abnormality within the chest or abdomen is noted. Electronically Signed   By: Inez Catalina M.D.   On: 09/24/2018 17:40    EKG: Independently reviewed.  No acute ST changes noted  Assessment/Plan Present on Admission: . NSTEMI (non-ST elevated myocardial infarction) [facility])  Active Problems:   NSTEMI (non-ST elevated myocardial infarction) ([place])  NSTEMI/CAD History as above Last stent placement in September 2019, stress test in May 2020 with no acute ischemic changes Troponin rising, 81-->878, will repeat in am EKG with no acute ST changes Chest x-ray unremarkable for any acute changes CTA chest/abdomen/pelvis done as patient has a history of aortic dissection prior to starting heparin drip (discussed with cardiology, if negative for aortic dissection may begin heparin drip) Echo done in 11/2017 showed EF of 35 to 40%, with diffuse hypokinesis, grade 1 diastolic dysfunction will repeat Start heparin drip, continue statins, aspirin Monitor closely in progressive unit Cardiology on board, will see in a.m.  Hypertensive crisis BP with systolics in the 616W Restart home BP meds, labetalol as needed May consider nitro drip if still persistent  History of aortic dissection status post repair CTA chest/abdomen/pelvis negative for any acute dissection Follow-up with cardiology  Chronic systolic and diastolic HF/ischemic cardiomyopathy Appears to be compensated Repeat echo pending  CKD stage III Stable, creatinine at baseline  Tobacco abuse Advised to quit      DVT prophylaxis: Heparin drip  Code Status: Full  Family Communication: None at bedside  Disposition Plan: To be determined  Consults called: Cardiology  Admission status: Inpatient    Alma Friendly MD [facility]   If 7PM-7AM,  please contact night-coverage [email or web address]   09/24/2018, 7:38 PM

## 2018-09-24 NOTE — Progress Notes (Signed)
[name] for heparin Indication: chest pain/ACS  Allergies  Allergen Reactions  . Imdur [Isosorbide Nitrate] Other (See Comments)    Headaches   . Tramadol Other (See Comments)    Headaches     Patient Measurements: weight 69 kg, height 71 inches   Heparin Dosing Weight: 69 kg  Vital Signs: Temp: 97.6 F (36.4 C) (07/11 0846) Temp Source: Oral (07/11 0846) BP: 171/102 (07/11 1939) Pulse Rate: 59 (07/11 1939)  Labs: Recent Labs    09/23/18 2050  09/24/18 0404 09/24/18 1341 09/24/18 1430  HGB 14.5  --   --  15.1  --   HCT 43.5  --   --  46.9  --   PLT 132*  --   --  123*  --   CREATININE 2.17*  --   --  2.05*  --   TROPONINIHS 53*   < > 194* 848* 878.0*   < > = values in this interval not displayed.    Estimated Creatinine Clearance: 44.2 mL/min (A) (by C-G formula based on SCr of 2.05 mg/dL (H)).   Medical History: Past Medical History:  Diagnosis Date  . Adenomatous colon polyp 08/2015  . Anxiety   . Aortic disease ([place])   . Aortic dissection (HCC)    a. admx 04/2014 >> L renal infarct; a/c renal failure >> b.  s/p Bioprosthetic Bentall and total arch replacement and staged endovascular repair of descending aortic aneurysm ([facility] - Dr. [name])  . CAD (coronary artery disease)    a. LHC 4/16:  oD1 60%  . Cardiomyopathy ([place])    a. non-ischemic - probably related to untreated HTN and ETOH abuse - Echo 3/13 with EF 35-40% >> b. Echo 4/16: Severe LVH, EF 55-60%, moderate AI, moderate MR, mild LAE, trivial effusion, known type B dissection with communication between true and false lumens with suprasternal images suggesting dissection plane may propagate to at least left subclavian takeoff, root above aortic valve okay    . Chronic abdominal pain   . Chronic combined systolic and diastolic congestive heart failure ([place])    a. 05/2011: Adm with pulm edema/HTN urgency, EF 35-40% with diffuse hypokinesis and moderate to severe  mitral regurgitation. Cardiomyopathy likely due to uncontrolled HTN and ETOH abuse - cath deferred due to renal insufficiency (felt due to uncontrolled HTN). bJodie Echevaria MV 06/2011: EF 37% and no ischemia or infarction. c. EF 45-50% by echo 01/2012.  [name] Chronic sinusitis   . CKD (chronic kidney disease)    a. 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  peak creatinine 3.46 during admx for aortic dissection 2/16.  sees Dr [name]  . DDD (degenerative disc disease), lumbar   . Depression   . Descending thoracic aortic aneurysm ([place])   . Dissecting aneurysm of thoracic aorta ([place])   . ETOH abuse    a. Reported to have quit 05/2011.  . Frequent headaches   . GERD (gastroesophageal reflux disease)   . Headache(784.0)    "q other day" (08/08/2013)  . Heart murmur   . Hemorrhoid thrombosis   . History of echocardiogram    Echo 1/17:  Severe LVH, EF 55-60%, no RWMA, Gr 2 DD, AVR ok, mild to mod MR, mild LAE, mild reduced RVSF, mod RAE  . History of medication noncompliance   . HYPERLIPIDEMIA   . Hypertension    a. Hx of HTN urgency secondary to noncompliance. b. urinary metanephrine and catecholeamine levels normal 2013.  c. Renal art [name]  1/16:  No evidence of renal artery stenosis noted bilaterally.  . INGUINAL HERNIA   . Pneumonia ~ 2013  . Renal insufficiency   . Serrated adenoma of colon 08/2015  . Stroke ([place])   . Tobacco abuse   . Valvular heart disease    a. Echo 05/2011: moderate to severe eccentric MR and mild to moderate AI with prolapsing left coronary cusp. b. Echo 01/2012: mild-mod AI, mild dilitation of aortic root, mild MR.;  c. Echo 1/16: Severe LVH consistent with hypertrophic cardio myopathy, EF 50%, no RWMA, mod AI, mild MR, mild RAE, dilated Ao root (40 mm);     Assessment: Patient is a 48 y.o M with hx history type B aortic dissection status post endoluminal stent grafting and ramus branch intervention a year ago, left AMA from [facility] on 7/11 at noon while he was being evaluated for CP and  elevated troponin. He presented to [facility] two hours after he left [facility] with c/o CP.  To start heparin drip for ACS.  - of note, pt received lovenox 40 mg SQ at ~0600 on 7/11 at [facility]  Goal of Therapy:  Heparin level 0.3-0.7 units/ml Monitor platelets by anticoagulation protocol: Yes   Plan:  - heparin 4000 units IV x1 bolus, then drip at 850 units/hr -check 6 hr heparin level - monitor for s/s bleeding  Dia Sitter P 09/24/2018,8:09 PM

## 2018-09-24 NOTE — ED Notes (Signed)
Two attempts made to call report, rn in a pt room. Will call back in about 5 minutes.

## 2018-09-24 NOTE — ED Triage Notes (Signed)
Pt states he has a headache and chest pain. HTN noted. Pt states he was at [facility] yesterday and was told he has had a Heart attack, they did not address the headache or HTN according to pt.

## 2018-09-24 NOTE — ED Notes (Signed)
Date and time results received: 09/24/18 1418 (use smartphrase ".now" to insert current time)  Test: Troponin Critical Value: 848  Name of Provider Notified: Tim, RN  Orders Received? Or Actions Taken?: Actions Taken: Engineer, structural

## 2018-09-24 NOTE — Discharge Summary (Signed)
Brief discharge summary: Patient was admitted last night with chest pain, elevated troponin and elevated blood pressure.  Patient was admitted for further assessment and management.  Patient improved gradually during the hospital stay.  Last recorded systolic blood pressure was 163mmHg.    Cardiology team was consulted to assist with patient's management.  Despite all efforts, patient has maintained that he he would want to sign himself out of the hospital [place].  Patient communicated the same to the cardiologist as well.  Patient is awake and alert, patient is able to receive and process medical information, hence, patient has capacity to make medical decisions.  Patient prefers to discharge himself [place].

## 2018-09-24 NOTE — H&P (Signed)
History and Physical    [name] WJX:[id number] DOB: 1970/09/04 DOA: 09/23/2018  PCP: Clent Demark, PA-C  Patient coming from: Home  I have personally briefly reviewed patient's old medical records in [facility]  Chief Complaint: Chest pain  HPI: [name] is a 48 y.o. male with medical history significant of CAD s/p stents, last stent placement in Sept 2019, non-ischemic stress test in May 2020 though he did have EF 32%.  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  Patient presents to ED today with c/o CP.  He states that he has been having slowly worsening chest pain that radiates to his arm.  The symptoms have been going on for about a week.  He states that he has taken tylenol with no relief.  He reports that he has been out of his medications.  On ROS: reports headache.   ED Course: CP resolved at this time.  BP initially running 213Y systolic, improves to 865 and CP resolved after 20mg  IV labetalol.  Initial trop 53, repeat 79.  Creat 2.1 (about baseline).   Review of Systems: As per HPI, otherwise all review of systems negative.  Past Medical History:  Diagnosis Date  . Adenomatous colon polyp 08/2015  . Anxiety   . Aortic disease ([place])   . Aortic dissection (HCC)    a. admx 04/2014 >> L renal infarct; a/c renal failure >> b.  s/p Bioprosthetic [place] and total arch replacement and staged endovascular repair of descending aortic aneurysm ([facility] - Dr. [name])  . CAD (coronary artery disease)    a. LHC 4/16:  oD1 60%  . Cardiomyopathy ([place])    a. non-ischemic - probably related to untreated HTN and ETOH abuse - Echo 3/13 with EF 35-40% >> b. Echo 4/16: Severe LVH, EF 55-60%, moderate AI, moderate MR, mild LAE, trivial effusion, known type B dissection with communication between true and false lumens with suprasternal images  suggesting dissection plane may propagate to at least left subclavian takeoff, root above aortic valve okay    . Chronic abdominal pain   . Chronic combined systolic and diastolic congestive heart failure ([place])    a. 05/2011: Adm with pulm edema/HTN urgency, EF 35-40% with diffuse hypokinesis and moderate to severe mitral regurgitation. Cardiomyopathy likely due to uncontrolled HTN and ETOH abuse - cath deferred due to renal insufficiency (felt due to uncontrolled HTN). bJodie Echevaria MV 06/2011: EF 37% and no ischemia or infarction. c. EF 45-50% by echo 01/2012.  [name] Chronic sinusitis   . CKD (chronic kidney disease)    a. 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  peak creatinine 3.46 during admx for aortic dissection 2/16.  sees Dr [name]  . DDD (degenerative disc disease), lumbar   . Depression   . Descending thoracic aortic aneurysm ([place])   . Dissecting aneurysm of thoracic aorta ([place])   . ETOH abuse    a. Reported to have quit 05/2011.  . Frequent headaches   . GERD (gastroesophageal reflux disease)   . Headache(784.0)    "q other day" (08/08/2013)  . Heart murmur   . Hemorrhoid thrombosis   . History of echocardiogram    Echo 1/17:  Severe LVH, EF 55-60%, no RWMA, Gr 2 DD, AVR ok, mild to mod MR, mild LAE, mild reduced RVSF, mod RAE  . History of medication noncompliance   .  HYPERLIPIDEMIA   . Hypertension    a. Hx of HTN urgency secondary to noncompliance. b. urinary metanephrine and catecholeamine levels normal 2013.  c. Renal art [name] 1/16:  No evidence of renal artery stenosis noted bilaterally.  . INGUINAL HERNIA   . Pneumonia ~ 2013  . Renal insufficiency   . Serrated adenoma of colon 08/2015  . Stroke ([place])   . Tobacco abuse   . Valvular heart disease    a. Echo 05/2011: moderate to severe eccentric MR and mild to moderate AI with prolapsing left coronary cusp. b. Echo 01/2012: mild-mod AI, mild dilitation of aortic root, mild MR.;  c. Echo 1/16: Severe LVH consistent with hypertrophic cardio  myopathy, EF 50%, no RWMA, mod AI, mild MR, mild RAE, dilated Ao root (40 mm);     Past Surgical History:  Procedure Laterality Date  . ANKLE SURGERY Bilateral    Fractures bilaterally  . AORTIC VALVE SURGERY  09/2014  . CORONARY ANGIOGRAPHY N/A 12/06/2017   Procedure: CORONARY ANGIOGRAPHY;  Surgeon: Belva Crome, MD;  Location: [facility];  Service: Cardiovascular;  Laterality: N/A;  . CORONARY STENT INTERVENTION N/A 12/10/2017   Procedure: CORONARY STENT INTERVENTION;  Surgeon: [name] Sine, MD;  Location: [facility];  Service: Cardiovascular;  Laterality: N/A;  . FOOT FRACTURE SURGERY Bilateral 2004-2010   "got pins in both of them"  . HEMORRHOID SURGERY N/A 06/15/2015   Procedure: HEMORRHOIDECTOMY;  Surgeon: Stark Klein, MD;  Location: [facility];  Service: General;  Laterality: N/A;  . INGUINAL HERNIA REPAIR Right ~ 1996  . LEFT HEART CATHETERIZATION WITH CORONARY ANGIOGRAM N/A 06/21/2014   Procedure: LEFT HEART CATHETERIZATION WITH CORONARY ANGIOGRAM;  Surgeon: Larey Dresser, MD;  Location: [facility] CATH [facility];  Service: Cardiovascular;  Laterality: N/A;  . LOOP RECORDER INSERTION N/A 06/19/2016   Procedure: Loop Recorder Insertion;  Surgeon: Deboraha Sprang, MD;  Location: [facility];  Service: Cardiovascular;  Laterality: N/A;  . TEE WITHOUT CARDIOVERSION N/A 03/12/2016   Procedure: TRANSESOPHAGEAL ECHOCARDIOGRAM (TEE);  Surgeon: Sanda Klein, MD;  Location: [facility] ENDOSCOPY;  Service: Cardiovascular;  Laterality: N/A;     reports that he has been smoking cigarettes. He has a 5.75 pack-year smoking history. He has never used smokeless tobacco. He reports current alcohol use of about 1.0 standard drinks of alcohol per week. He reports current drug use. Drug: Marijuana.  Allergies  Allergen Reactions  . Imdur [Isosorbide Nitrate] Other (See Comments)    Headaches   . Tramadol Other (See Comments)    Headaches     Family History  Problem Relation Age of Onset  .  Hypertension Mother   . Hypertension Other   . Colon cancer Paternal Uncle   . Stroke Maternal Aunt   . Heart attack Brother   . Diabetes Maternal Aunt   . Lung cancer Maternal Uncle   . Other Sister        "breathing machine at night"  . Headache Sister   . Thyroid disease Sister   . Throat cancer Neg Hx   . Pancreatic cancer Neg Hx   . Esophageal cancer Neg Hx   . Kidney disease Neg Hx   . Liver disease Neg Hx      Prior to Admission medications   Medication Sig Start Date End Date Taking? Authorizing Provider  acetaminophen (TYLENOL) 325 MG tablet Take 650 mg by mouth every 6 (six) hours as needed (for pain or headaches).    Yes  [provider]  amLODipine (NORVASC) 10 MG tablet Take 1 tablet (10 mg total) by mouth daily. 07/29/18  Yes Larey Dresser, MD  aspirin EC 81 MG EC tablet Take 1 tablet (81 mg total) by mouth daily. 12/12/17  Yes [facility], Tami Lin, PA  carvedilol (COREG) 25 MG tablet Take 1 tablet (25 mg total) by mouth 2 (two) times daily with a meal. 12/11/17  Yes [facility], Tami Lin, PA  cloNIDine (CATAPRES - DOSED IN MG/24 HR) 0.3 mg/24hr patch Place 1 patch (0.3 mg total) onto the skin once a week. Patient taking differently: Place 0.3 mg onto the skin every Saturday.  09/23/18  Yes Larey Dresser, MD  ezetimibe (ZETIA) 10 MG tablet Take 1 tablet (10 mg total) by mouth daily. 08/03/18 11/01/18 Yes Larey Dresser, MD  furosemide (LASIX) 40 MG tablet Take one tablet daily as needed for shortness of breath or weight gain of more than 3 lbs in one day. Patient taking differently: Take 40 mg by mouth daily as needed (shortness of breath or weight gain of more than 3 lbs in one day).  12/11/17  Yes [facility], Tami Lin, PA  hydrALAZINE (APRESOLINE) 100 MG tablet Take 100 mg by mouth 2 (two) times daily.   Yes [provider]  pantoprazole (PROTONIX) 40 MG tablet Take 1 tablet (40 mg total) by mouth daily. 08/18/18  Yes Ladene Artist, MD  rosuvastatin  (CRESTOR) 40 MG tablet Take 1 tablet (40 mg total) by mouth daily. 05/09/18 11/10/18 Yes Larey Dresser, MD  clopidogrel (PLAVIX) 75 MG tablet Take 1 tablet (75 mg total) by mouth daily with breakfast. Patient not taking: Reported on 09/23/2018 12/12/17   Ledora Bottcher, PA    Physical Exam: Vitals:   09/23/18 2048 09/23/18 2215 09/24/18 0000 09/24/18 0100  BP: (!) 199/107 (!) 155/124 (!) 186/96   Pulse:  76 79   Resp:  (!) 26 (!) 22   Temp:      TempSrc:      SpO2:  100% 99%   Weight:    69.9 kg  Height:    5\' 11"  (1.803 m)    Constitutional: NAD, calm, comfortable Eyes: PERRL, lids and conjunctivae normal ENMT: Mucous membranes are moist. Posterior pharynx clear of any exudate or lesions.Normal dentition.  Neck: normal, supple, no masses, no thyromegaly Respiratory: clear to auscultation bilaterally, no wheezing, no crackles. Normal respiratory effort. No accessory muscle use.  Cardiovascular: Regular rate and rhythm, no murmurs / rubs / gallops. No extremity edema. 2+ pedal pulses. No carotid bruits.  Abdomen: no tenderness, no masses palpated. No hepatosplenomegaly. Bowel sounds positive.  Musculoskeletal: no clubbing / cyanosis. No joint deformity upper and lower extremities. Good ROM, no contractures. Normal muscle tone.  Skin: no rashes, lesions, ulcers. No induration Neurologic: CN 2-12 grossly intact. Sensation intact, DTR normal. Strength 5/5 in all 4.  Psychiatric: Normal judgment and insight. Alert and oriented x 3. Normal mood.    Labs on Admission: I have personally reviewed following labs and imaging studies  CBC: Recent Labs  Lab 09/23/18 2050  WBC 6.8  HGB 14.5  HCT 43.5  MCV 94.4  PLT 147*   Basic Metabolic Panel: Recent Labs  Lab 09/23/18 2050  NA 141  K 3.6  CL 109  CO2 22  GLUCOSE 89  BUN 16  CREATININE 2.17*  CALCIUM 9.2   GFR: Estimated Creatinine Clearance: 42.1 mL/min (A) (by C-G formula based on SCr of 2.17 mg/dL (H)).  Liver  Function Tests: No results for input(s): AST, ALT, ALKPHOS, BILITOT, PROT, ALBUMIN in the last 168 hours. No results for input(s): LIPASE, AMYLASE in the last 168 hours. No results for input(s): AMMONIA in the last 168 hours. Coagulation Profile: No results for input(s): INR, PROTIME in the last 168 hours. Cardiac Enzymes: No results for input(s): CKTOTAL, CKMB, CKMBINDEX, TROPONINI in the last 168 hours. BNP (last 3 results) No results for input(s): PROBNP in the last 8760 hours. HbA1C: No results for input(s): HGBA1C in the last 72 hours. CBG: No results for input(s): GLUCAP in the last 168 hours. Lipid Profile: No results for input(s): CHOL, HDL, LDLCALC, TRIG, CHOLHDL, LDLDIRECT in the last 72 hours. Thyroid Function Tests: No results for input(s): TSH, T4TOTAL, FREET4, T3FREE, THYROIDAB in the last 72 hours. Anemia Panel: No results for input(s): VITAMINB12, FOLATE, FERRITIN, TIBC, IRON, RETICCTPCT in the last 72 hours. Urine analysis:    Component Value Date/Time   COLORURINE YELLOW 01/31/2017 [place] 01/31/2017 1203   LABSPEC >1.046 (H) 01/31/2017 1203   PHURINE 5.0 01/31/2017 1203   GLUCOSEU NEGATIVE 01/31/2017 1203   HGBUR NEGATIVE 01/31/2017 1203   HGBUR negative 02/18/2009 0901   BILIRUBINUR NEGATIVE 01/31/2017 [place] 01/31/2017 1203   PROTEINUR 100 (A) 01/31/2017 1203   UROBILINOGEN 0.2 01/02/2015 1945   NITRITE NEGATIVE 01/31/2017 1203   LEUKOCYTESUR NEGATIVE 01/31/2017 1203    Radiological Exams on Admission: Dg Chest 2 View  Result Date: 09/23/2018 CLINICAL DATA:  Chest pain EXAM: CHEST - 2 VIEW COMPARISON:  09/14/2018, 03/25/2018, CT 12/02/2017, radiograph 01/31/2017 FINDINGS: Post sternotomy changes with aortic valve and root prostheses. Similar appearance of thoracic aortic stent graft. Stable pleural and parenchymal scarring at the left base. No acute airspace disease or effusion. No pneumothorax. Stable mild  cardiomegaly. IMPRESSION: No active cardiopulmonary disease. Stable postsurgical changes of the mediastinum with pleural and parenchymal scarring at the left base Electronically Signed   By: Donavan Foil M.D.   On: 09/23/2018 21:10    EKG: Independently reviewed.  Assessment/Plan Principal Problem:   Chest pain, rule out acute myocardial infarction Active Problems:   Malignant hypertension   CKD (chronic kidney disease) stage 3, GFR 30-59 ml/min (HCC)   H/O aortic valve replacement   History of aortic dissection   Coronary artery disease involving native coronary artery of native heart with angina pectoris (HCC)    1. CP r/o - 1. h/o CAD with last stent in Sept 2019 2. No acute ischemia findings on stress test May 2020 3. CP obs pathway 4. ACS vs HTN urgency 5. Serial trops 6. Tele monitor 7. CP free at the moment 8. Need to control BP (see HTN below) 9. Cont statin and ASA 10. Resume Plavix which it seems hes been off of since around March or so. 11. Cards eval in AM 2. Malignant HTN - 1. Continue all home BP meds 2. Add PRN hydralazine IV 3. Add PRN labetalol if hydralazine doesn't work 4. Add NTG if CP reoccurs 3. H/o aortic dissection and AVR - 1. S/p repair 2. Bioprosthetic valve 4. CKD stage 3 - 1. Chronic and stable 5. HFrEF - 1. EF 32% on stress test in May  DVT prophylaxis: Lovenox Code Status: Full Family Communication: No family in room Disposition Plan: Home after admit Consults called: Message sent to [facility]. [facility] for cards eval in AM Admission status: Place in [place], [place] Hospitalists  How to contact the [facility] Attending or Consulting provider [place] or covering provider during after hours [place], for this patient?  1. Check the care team in [facility] and look for a) attending/consulting [facility] provider listed and b) the [facility] team listed 2. Log into [email or web address]  Amion Physician Scheduling and messaging for groups and whole hospitals  On  call and physician scheduling software for group practices, residents, hospitalists and other medical providers for call, clinic, rotation and shift schedules. On[employer] is a hospital-wide system for scheduling doctors and paging doctors on call. EasyPlot is for scientific plotting and data analysis.  [email or web address]  and use [place]'s universal password to access. If you do not have the password, please contact the hospital operator.  3. Locate the [facility] provider you are looking for under [facility] and page to a number that you can be directly reached. 4. If you still have difficulty reaching the provider, please page the [facility] (Director on Call) for the Hospitalists listed on amion for assistance.  09/24/2018, 1:33 AM

## 2018-09-24 NOTE — ED Provider Notes (Signed)
[facility] DEPT Provider Note   CSN: [id number] Arrival date & time: 09/24/18  1216     History   Chief Complaint Chief Complaint  Patient presents with  . Headache  . Chest Pain    HPI [name] is a 48 y.o. male.     48 year old male presents with persistent chest pain dyspnea for several days.  Patient was admitted inpatient over [facility] and left AMA just 2 hours ago due to feeling like there was poor communication.  Patient's cardiac enzymes have bumped.  Patient characterizes his pain is midsternal persistent and not associated with diaphoresis or nausea or vomiting.  He also complains of a persistent headache which she states has been waxing and waning.  Starts in his chest and goes up into his neck the left side of his head.  No focal neurological deficits with that.  Patient has noted also that morphine relieves his pain which he did in the hospital but then his headache returns.     Past Medical History:  Diagnosis Date  . Adenomatous colon polyp 08/2015  . Anxiety   . Aortic disease ([place])   . Aortic dissection (HCC)    a. admx 04/2014 >> L renal infarct; a/c renal failure >> b.  s/p Bioprosthetic [place] and total arch replacement and staged endovascular repair of descending aortic aneurysm ([facility] - Dr. [name])  . CAD (coronary artery disease)    a. LHC 4/16:  oD1 60%  . Cardiomyopathy ([place])    a. non-ischemic - probably related to untreated HTN and ETOH abuse - Echo 3/13 with EF 35-40% >> b. Echo 4/16: Severe LVH, EF 55-60%, moderate AI, moderate MR, mild LAE, trivial effusion, known type B dissection with communication between true and false lumens with suprasternal images suggesting dissection plane may propagate to at least left subclavian takeoff, root above aortic valve okay    . Chronic abdominal pain   . Chronic combined systolic and diastolic congestive heart failure ([place])    a. 05/2011: Adm with pulm edema/HTN urgency,  EF 35-40% with diffuse hypokinesis and moderate to severe mitral regurgitation. Cardiomyopathy likely due to uncontrolled HTN and ETOH abuse - cath deferred due to renal insufficiency (felt due to uncontrolled HTN). bJodie Echevaria MV 06/2011: EF 37% and no ischemia or infarction. c. EF 45-50% by echo 01/2012.  [name] Chronic sinusitis   . CKD (chronic kidney disease)    a. 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  peak creatinine 3.46 during admx for aortic dissection 2/16.  sees Dr [name]  . DDD (degenerative disc disease), lumbar   . Depression   . Descending thoracic aortic aneurysm ([place])   . Dissecting aneurysm of thoracic aorta ([place])   . ETOH abuse    a. Reported to have quit 05/2011.  . Frequent headaches   . GERD (gastroesophageal reflux disease)   . Headache(784.0)    "q other day" (08/08/2013)  . Heart murmur   . Hemorrhoid thrombosis   . History of echocardiogram    Echo 1/17:  Severe LVH, EF 55-60%, no RWMA, Gr 2 DD, AVR ok, mild to mod MR, mild LAE, mild reduced RVSF, mod RAE  . History of medication noncompliance   . HYPERLIPIDEMIA   . Hypertension    a. Hx of HTN urgency secondary to noncompliance. b. urinary metanephrine and catecholeamine levels normal 2013.  c. Renal art [name] 1/16:  No evidence of renal artery stenosis noted bilaterally.  . INGUINAL HERNIA   .  Pneumonia ~ 2013  . Renal insufficiency   . Serrated adenoma of colon 08/2015  . Stroke ([place])   . Tobacco abuse   . Valvular heart disease    a. Echo 05/2011: moderate to severe eccentric MR and mild to moderate AI with prolapsing left coronary cusp. b. Echo 01/2012: mild-mod AI, mild dilitation of aortic root, mild MR.;  c. Echo 1/16: Severe LVH consistent with hypertrophic cardio myopathy, EF 50%, no RWMA, mod AI, mild MR, mild RAE, dilated Ao root (40 mm);     Patient Active Problem List   Diagnosis Date Noted  . Chest pain, rule out acute myocardial infarction 09/24/2018  . Chronic systolic (congestive) heart failure ([place])  04/25/2018  . Acute on chronic combined systolic and diastolic CHF (congestive heart failure) ([place]) 12/08/2017  . Coronary artery disease involving native coronary artery of native heart with angina pectoris ([place]) 12/06/2017  . NSTEMI (non-ST elevated myocardial infarction) ([place]) 12/02/2017  . Hypertensive emergency - Accelerated Hypertension 12/02/2017  . History of stroke 10/05/2017  . Somnolence, daytime 04/07/2017  . Snoring 04/07/2017  . Cerebral infarction due to embolism of left middle cerebral artery ([place]) 06/03/2016  . History of aortic dissection 06/03/2016  . Palpitation   . Cerebrovascular accident (CVA) due to embolism of left middle cerebral artery ([place])   . Seizures ([place]) 02/28/2016  . HLD (hyperlipidemia) 08/28/2015  . 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[place]) 06/07/2015  . Cardiomyopathy ([place]) 03/01/2015  . Acid reflux 03/01/2015  . Essential hypertension 03/01/2015  . S/P aortic dissection repair 10/03/2014  . H/O aortic valve replacement 10/03/2014  . CKD (chronic kidney disease) stage 3, GFR 30-59 ml/min (HCC) 05/15/2014  . AKI (acute kidney injury) ([place])   . Dissecting aneurysm of thoracic aorta, Stanford type B ([place]) 04/24/2014  . Aortic dissection ([place]) 04/23/2014  . Aneurysm of ascending aorta (HCC) 03/28/2014  . Dyspnea 03/27/2014  . ETOH abuse 09/20/2013  . Malignant hypertension 02/20/2013  . Hypertensive urgency 01/27/2012  . Cardiomyopathy, hypertensive ([place]) 01/26/2012  . AI (aortic insufficiency) 01/26/2012  . MR (mitral regurgitation) 01/26/2012  . Acute renal failure superimposed on stage 3 chronic kidney disease ([place]) 01/26/2012  . Hypertensive cardiomyopathy, without heart failure ([place]): EF reduced to 35-40% from 55%. 06/10/2011  . Chronic bronchitis 05/23/2011  . Cardiomegaly - hypertensive 05/22/2011  . Marijuana use 05/22/2011  . Abdominal pain 05/22/2011  . Hypertension, malignant 05/22/2011  . Anxiety and depression 05/22/2011  .  Hyperlipidemia LDL goal <70 08/26/2009  . Tobacco use 08/26/2009  . Headache(784.0) 08/26/2009  . Aortic valve disorder 03/11/2009  . INGUINAL HERNIA 02/18/2009  . Uncontrolled hypertension 01/16/2009    Past Surgical History:  Procedure Laterality Date  . ANKLE SURGERY Bilateral    Fractures bilaterally  . AORTIC VALVE SURGERY  09/2014  . CORONARY ANGIOGRAPHY N/A 12/06/2017   Procedure: CORONARY ANGIOGRAPHY;  Surgeon: Belva Crome, MD;  Location: [facility] CV LAB;  Service: Cardiovascular;  Laterality: N/A;  . CORONARY STENT INTERVENTION N/A 12/10/2017   Procedure: CORONARY STENT INTERVENTION;  Surgeon: [name] Sine, MD;  Location: [facility] CV LAB;  Service: Cardiovascular;  Laterality: N/A;  . FOOT FRACTURE SURGERY Bilateral 2004-2010   "got pins in both of them"  . HEMORRHOID SURGERY N/A 06/15/2015   Procedure: HEMORRHOIDECTOMY;  Surgeon: Stark Klein, MD;  Location: [facility];  Service: General;  Laterality: N/A;  . INGUINAL HERNIA REPAIR Right ~ 1996  . LEFT HEART CATHETERIZATION WITH CORONARY ANGIOGRAM N/A 06/21/2014   Procedure: LEFT  HEART CATHETERIZATION WITH CORONARY ANGIOGRAM;  Surgeon: Larey Dresser, MD;  Location[facility] C[facility] LAB;  Service: Cardiovascular;  Laterality: N/A;  . LOOP RECORDER INSERTION N/A 06/19/2016   Procedure: Loop Recorder Insertion;  Surgeon: Deboraha Sprang, MD;  Location: [facility] CV LAB;  Service: Cardiovascular;  Laterality: N/A;  . TEE WITHOUT CARDIOVERSION N/A 03/12/2016   Procedure: TRANSESOPHAGEAL ECHOCARDIOGRAM (TEE);  Surgeon: Sanda Klein, MD;  Location: [facility] ENDOSCOPY;  Service: Cardiovascular;  Laterality: N/A;        Home Medications    Prior to Admission medications   Medication Sig Start Date End Date Taking? Authorizing Provider  acetaminophen (TYLENOL) 325 MG tablet Take 650 mg by mouth every 6 (six) hours as needed (for pain or headaches).     [provider]  amLODipine (NORVASC) 10 MG tablet Take 1 tablet (10 mg total)  by mouth daily. 07/29/18   Larey Dresser, MD  aspirin EC 81 MG EC tablet Take 1 tablet (81 mg total) by mouth daily. 12/12/17   [facility], Tami Lin, PA  carvedilol (COREG) 25 MG tablet Take 1 tablet (25 mg total) by mouth 2 (two) times daily with a meal. 12/11/17   [facility], Tami Lin, PA  cloNIDine (CATAPRES - DOSED IN MG/24 HR) 0.3 mg/24hr patch Place 1 patch (0.3 mg total) onto the skin once a week. Patient taking differently: Place 0.3 mg onto the skin every Saturday.  09/23/18   Larey Dresser, MD  clopidogrel (PLAVIX) 75 MG tablet Take 1 tablet (75 mg total) by mouth daily with breakfast. Patient not taking: Reported on 09/23/2018 12/12/17   Ledora Bottcher, PA  ezetimibe (ZETIA) 10 MG tablet Take 1 tablet (10 mg total) by mouth daily. 08/03/18 11/01/18  Larey Dresser, MD  furosemide (LASIX) 40 MG tablet Take one tablet daily as needed for shortness of breath or weight gain of more than 3 lbs in one day. Patient taking differently: Take 40 mg by mouth daily as needed (shortness of breath or weight gain of more than 3 lbs in one day).  12/11/17   [facility], Tami Lin, PA  hydrALAZINE (APRESOLINE) 100 MG tablet Take 100 mg by mouth 2 (two) times daily.    [provider]  pantoprazole (PROTONIX) 40 MG tablet Take 1 tablet (40 mg total) by mouth daily. 08/18/18   Ladene Artist, MD  rosuvastatin (CRESTOR) 40 MG tablet Take 1 tablet (40 mg total) by mouth daily. 05/09/18 11/10/18  Larey Dresser, MD    Family History Family History  Problem Relation Age of Onset  . Hypertension Mother   . Hypertension Other   . Colon cancer Paternal Uncle   . Stroke Maternal Aunt   . Heart attack Brother   . Diabetes Maternal Aunt   . Lung cancer Maternal Uncle   . Other Sister        "breathing machine at night"  . Headache Sister   . Thyroid disease Sister   . Throat cancer Neg Hx   . Pancreatic cancer Neg Hx   . Esophageal cancer Neg Hx   . Kidney disease Neg Hx   . Liver disease Neg  Hx     Social History Social History   Tobacco Use  . Smoking status: Current Some Day Smoker    Packs/day: 0.25    Years: 23.00    Pack years: 5.75    Types: Cigarettes  . Smokeless tobacco: Never Used  . Tobacco comment: 3 to 4 per  day  Substance Use Topics  . Alcohol use: Yes    Alcohol/week: 1.0 standard drinks    Types: 1 Cans of beer per week    Comment: occasionally  . Drug use: Yes    Types: Marijuana    Comment: 2 times/week     Allergies   Imdur [isosorbide nitrate] and Tramadol   Review of Systems Review of Systems  All other systems reviewed and are negative.    Physical Exam Updated Vital Signs BP (!) 165/92 (BP Location: Right Arm)   Pulse 84   Resp 19   SpO2 100%   Physical Exam Vitals signs and nursing note reviewed.  Constitutional:      General: He is not in acute distress.    Appearance: Normal appearance. He is well-developed. He is not toxic-appearing.  HENT:     Head: Normocephalic and atraumatic.  Eyes:     General: Lids are normal.     Conjunctiva/sclera: Conjunctivae normal.     Pupils: Pupils are equal, round, and reactive to light.  Neck:     Musculoskeletal: Normal range of motion and neck supple.     Thyroid: No thyroid mass.     Trachea: No tracheal deviation.  Cardiovascular:     Rate and Rhythm: Normal rate and regular rhythm.     Heart sounds: Normal heart sounds. No murmur. No gallop.   Pulmonary:     Effort: Pulmonary effort is normal. No respiratory distress.     Breath sounds: Normal breath sounds. No stridor. No decreased breath sounds, wheezing, rhonchi or rales.  Abdominal:     General: Bowel sounds are normal. There is no distension.     Palpations: Abdomen is soft.     Tenderness: There is no abdominal tenderness. There is no rebound.  Musculoskeletal: Normal range of motion.        General: No tenderness.  Skin:    General: Skin is warm and dry.     Findings: No abrasion or rash.  Neurological:      Mental Status: He is alert and oriented to person, place, and time.     GCS: GCS eye subscore is 4. GCS verbal subscore is 5. GCS motor subscore is 6.     Cranial Nerves: No cranial nerve deficit.     Sensory: No sensory deficit.  Psychiatric:        Speech: Speech normal.        Behavior: Behavior normal.      ED Treatments / Results  Labs (all labs ordered are listed, but only abnormal results are displayed) Labs Reviewed  BASIC METABOLIC PANEL  CBC  TROPONIN I (HIGH SENSITIVITY)    EKG EKG Interpretation  Date/Time:  Saturday September 24 2018 12:24:32 EDT Ventricular Rate:  80 PR Interval:    QRS Duration: 95 QT Interval:  406 QTC Calculation: 469 R Axis:   -32 Text Interpretation:  Sinus rhythm Biatrial enlargement LVH with secondary repolarization abnormality No significant change since last tracing Confirmed by Lacretia Leigh ([id number]) on 09/24/2018 1:13:40 PM   Radiology Dg Chest 2 View  Result Date: 09/24/2018 CLINICAL DATA:  Chest pain, headache, hypertension EXAM: CHEST - 2 VIEW COMPARISON:  09/23/2018 FINDINGS: Unchanged examination. No acute abnormality of the lungs. Cardiomegaly status post median sternotomy with surgical aortic annulus and extensive multi component stent endograft repair of the aorta. IMPRESSION: Unchanged examination. No acute abnormality of the lungs. Cardiomegaly status post median sternotomy with surgical aortic annulus and extensive multi component  stent endograft repair of the aorta. Electronically Signed   By: Eddie Candle M.D.   On: 09/24/2018 13:10   Dg Chest 2 View  Result Date: 09/23/2018 CLINICAL DATA:  Chest pain EXAM: CHEST - 2 VIEW COMPARISON:  09/14/2018, 03/25/2018, CT 12/02/2017, radiograph 01/31/2017 FINDINGS: Post sternotomy changes with aortic valve and root prostheses. Similar appearance of thoracic aortic stent graft. Stable pleural and parenchymal scarring at the left base. No acute airspace disease or effusion. No pneumothorax.  Stable mild cardiomegaly. IMPRESSION: No active cardiopulmonary disease. Stable postsurgical changes of the mediastinum with pleural and parenchymal scarring at the left base Electronically Signed   By: Donavan Foil M.D.   On: 09/23/2018 21:10    Procedures Procedures (including critical care time)  Medications Ordered in ED Medications  sodium chloride flush (NS) 0.9 % injection 3 mL (has no administration in time range)  morphine 4 MG/ML injection 6 mg (has no administration in time range)  0.9 %  sodium chloride infusion (has no administration in time range)     Initial Impression / Assessment and Plan / ED Course  I have reviewed the triage vital signs and the nursing notes.  Pertinent labs & imaging results that were available during my care of the patient were reviewed by me and considered in my medical decision making (see chart for details).       Patient's headache treated with morphine here and he feels better.  He has no focal neurological findings.  Do not feel that he needs any intracranial imaging at this time as he states that his headache has been chronic and just recur from time to time and is not different than his prior.  Patient's blood pressure noted and will start patient on nitroglycerin. Patient is EKG is unchanged here.  His chest x-ray without acute findings.  Given his current symptoms low suspicion for dissection at this time.  Troponin continues to escalate.  Case discussed with cardiology at [facility] and states that they do not have a spot available for him.  Patient is now agreeable to staying here at [facility].  Discussed with cardiology on-call here who recommends medicine admission and they will see the patient in consultation.  CRITICAL CARE Performed by: Leota Jacobsen Total critical care time: 60 minutes Critical care time was exclusive of separately billable procedures and treating other patients. Critical care was necessary to treat or  prevent imminent or life-threatening deterioration. Critical care was time spent personally by me on the following activities: development of treatment plan with patient and/or surrogate as well as nursing, discussions with consultants, evaluation of patient's response to treatment, examination of patient, obtaining history from patient or surrogate, ordering and performing treatments and interventions, ordering and review of laboratory studies, ordering and review of radiographic studies, pulse oximetry and re-evaluation of patient's condition.   Final Clinical Impressions(s) / ED Diagnoses   Final diagnoses:  None    ED Discharge Orders    None       Lacretia Leigh, MD 09/24/18 1442

## 2018-09-24 NOTE — ED Notes (Signed)
Attempted report.  Nurse to call back when available. 

## 2018-09-24 NOTE — ED Notes (Signed)
Patient called for lab work, no answer

## 2018-09-24 NOTE — Progress Notes (Signed)
Pt wanting to leave AMA, education given to pt about positive troponins and elevated BP, MD notified, MD assessed  as well as explanationed that d/c was not advised.

## 2018-09-25 ENCOUNTER — Inpatient Hospital Stay (HOSPITAL_COMMUNITY): Payer: Medicare HMO

## 2018-09-25 DIAGNOSIS — F1920 Other psychoactive substance dependence, uncomplicated: Secondary | ICD-10-CM

## 2018-09-25 DIAGNOSIS — R109 Unspecified abdominal pain: Secondary | ICD-10-CM

## 2018-09-25 DIAGNOSIS — R079 Chest pain, unspecified: Secondary | ICD-10-CM

## 2018-09-25 DIAGNOSIS — I160 Hypertensive urgency: Secondary | ICD-10-CM

## 2018-09-25 DIAGNOSIS — I429 Cardiomyopathy, unspecified: Secondary | ICD-10-CM

## 2018-09-25 DIAGNOSIS — I214 Non-ST elevation (NSTEMI) myocardial infarction: Principal | ICD-10-CM

## 2018-09-25 DIAGNOSIS — G8929 Other chronic pain: Secondary | ICD-10-CM

## 2018-09-25 DIAGNOSIS — I10 Essential (primary) hypertension: Secondary | ICD-10-CM

## 2018-09-25 LAB — CBC
HCT: 42.5 % (ref 39.0–52.0)
Hemoglobin: 14.4 g/dL (ref 13.0–17.0)
MCH: 31.4 pg (ref 26.0–34.0)
MCHC: 33.9 g/dL (ref 30.0–36.0)
MCV: 92.8 fL (ref 80.0–100.0)
Platelets: 143 10*3/uL — ABNORMAL LOW (ref 150–400)
RBC: 4.58 MIL/uL (ref 4.22–5.81)
RDW: 13.7 % (ref 11.5–15.5)
WBC: 6.3 10*3/uL (ref 4.0–10.5)
nRBC: 0 % (ref 0.0–0.2)

## 2018-09-25 LAB — BASIC METABOLIC PANEL
Anion gap: 8 (ref 5–15)
BUN: 21 mg/dL — ABNORMAL HIGH (ref 6–20)
CO2: 24 mmol/L (ref 22–32)
Calcium: 8.9 mg/dL (ref 8.9–10.3)
Chloride: 104 mmol/L (ref 98–111)
Creatinine, Ser: 2.11 mg/dL — ABNORMAL HIGH (ref 0.61–1.24)
GFR calc Af Amer: 42 mL/min — ABNORMAL LOW (ref >60)
GFR calc non Af Amer: 36 mL/min — ABNORMAL LOW (ref >60)
Glucose, Bld: 108 mg/dL — ABNORMAL HIGH (ref 70–99)
Potassium: 3.5 mmol/L (ref 3.5–5.1)
Sodium: 136 mmol/L (ref 135–145)

## 2018-09-25 LAB — TROPONIN I (HIGH SENSITIVITY): Troponin I (High Sensitivity): 2314 ng/L (ref <18)

## 2018-09-25 LAB — HEPARIN LEVEL (UNFRACTIONATED)
Heparin Unfractionated: 0.21 IU/mL — ABNORMAL LOW (ref 0.30–0.70)
Heparin Unfractionated: 0.33 IU/mL (ref 0.30–0.70)
Heparin Unfractionated: 0.27 IU/mL — ABNORMAL LOW (ref 0.30–0.70)

## 2018-09-25 LAB — ECHOCARDIOGRAM COMPLETE
Height: 71 in
Weight: 2447.99 oz

## 2018-09-25 LAB — MRSA PCR SCREENING: MRSA by PCR: NEGATIVE

## 2018-09-25 MED ORDER — MORPHINE SULFATE (PF) 2 MG/ML IV SOLN
1.0000 mg | INTRAVENOUS | Status: DC | PRN
  Administered 2018-09-27 – 2018-09-28 (×3): 1 mg via INTRAVENOUS
  Filled 2018-09-25 (×3): qty 1

## 2018-09-25 MED ORDER — SODIUM CHLORIDE 0.9 % IV SOLN: 250.0000 mL | INTRAVENOUS | Status: DC | PRN

## 2018-09-25 MED ORDER — HYDRALAZINE HCL 50 MG PO TABS
100.0000 mg | ORAL_TABLET | Freq: Three times a day (TID) | ORAL | Status: DC
  Administered 2018-09-25 – 2018-09-28 (×9): 100 mg via ORAL
  Filled 2018-09-25 (×9): qty 2

## 2018-09-25 MED ORDER — SODIUM CHLORIDE 0.9 % WEIGHT BASED INFUSION
1.0000 mL/kg/h | INTRAVENOUS | Status: DC
  Administered 2018-09-25: 1 mL/kg/h via INTRAVENOUS

## 2018-09-25 MED ORDER — MORPHINE SULFATE (PF) 2 MG/ML IV SOLN
1.0000 mg | Freq: Four times a day (QID) | INTRAVENOUS | Status: DC | PRN
  Filled 2018-09-25: qty 1

## 2018-09-25 MED ORDER — NITROGLYCERIN 2 % TD OINT
0.5000 [in_us] | TOPICAL_OINTMENT | Freq: Three times a day (TID) | TRANSDERMAL | Status: DC
  Administered 2018-09-25 – 2018-09-26 (×4): 0.5 [in_us] via TOPICAL
  Filled 2018-09-25: qty 30

## 2018-09-25 MED ORDER — SODIUM CHLORIDE 0.9% FLUSH
3.0000 mL | Freq: Two times a day (BID) | INTRAVENOUS | Status: DC
  Administered 2018-09-25: 3 mL via INTRAVENOUS

## 2018-09-25 MED ORDER — CLONIDINE HCL 0.3 MG/24HR TD PTWK: 0.3000 mg | MEDICATED_PATCH | TRANSDERMAL | Status: DC

## 2018-09-25 MED ORDER — MORPHINE SULFATE (PF) 2 MG/ML IV SOLN
INTRAVENOUS | Status: AC
  Administered 2018-09-25: 09:00:00 1 mg via INTRAVENOUS
  Filled 2018-09-25: qty 1

## 2018-09-25 MED ORDER — CLONIDINE HCL 0.3 MG/24HR TD PTWK
0.3000 mg | MEDICATED_PATCH | TRANSDERMAL | Status: DC
  Filled 2018-09-25: qty 1

## 2018-09-25 MED ORDER — SODIUM CHLORIDE 0.9% FLUSH: 3.0000 mL | INTRAVENOUS | Status: DC | PRN

## 2018-09-25 MED ORDER — MORPHINE SULFATE (PF) 2 MG/ML IV SOLN
1.0000 mg | Freq: Once | INTRAVENOUS | Status: AC
  Administered 2018-09-25: 09:00:00 1 mg via INTRAVENOUS

## 2018-09-25 MED ORDER — BUTALBITAL-APAP-CAFFEINE 50-325-40 MG PO TABS
1.0000 | ORAL_TABLET | Freq: Four times a day (QID) | ORAL | Status: DC | PRN
  Administered 2018-09-25: 1 via ORAL
  Filled 2018-09-25 (×2): qty 1

## 2018-09-25 NOTE — Progress Notes (Signed)
Dr Gwenlyn Found went to see patient, patient was vulgur with the doctor. Calling him a "son of a B". After I went in room and he immediately asked for Morphine as that doctor made his headache come back. mentionned to him I would have to call his primary doctor for an order for his pain medication. New order rec'd for Fiorcet for his headache/migraine. Waiting for pharmacy to send medication.

## 2018-09-25 NOTE — Progress Notes (Signed)
  Echocardiogram 2D Echocardiogram has been performed.  [name] 09/25/2018, 11:21 AM

## 2018-09-25 NOTE — Progress Notes (Signed)
[place] for heparin Indication: chest pain/ACS  Allergies  Allergen Reactions  . Imdur [Isosorbide Nitrate] Other (See Comments)    Headaches   . Tramadol Other (See Comments)    Headaches     Patient Measurements: weight 69 kg, height 71 inches Height: 5\' 11"  (180.3 cm) Weight: 153 lb (69.4 kg) IBW/kg (Calculated) : 75.3 Heparin Dosing Weight: 69 kg  Vital Signs: Temp: 98 F (36.7 C) (07/12 1043) Temp Source: Oral (07/12 1043) BP: 151/98 (07/12 1100) Pulse Rate: 61 (07/12 1100)  Labs: Recent Labs    09/23/18 2050  09/24/18 1341 09/24/18 1430 09/25/18 0309 09/25/18 1129  HGB 14.5  --  15.1  --  14.4  --   HCT 43.5  --  46.9  --  42.5  --   PLT 132*  --  123*  --  143*  --   HEPARINUNFRC  --   --   --   --  0.21* 0.33  CREATININE 2.17*  --  2.05*  --  2.11*  --   TROPONINIHS 53*   < > 848* 878.0* 2,314*  --    < > = values in this interval not displayed.    Estimated Creatinine Clearance: 42.9 mL/min (A) (by C-G formula based on SCr of 2.11 mg/dL (H)).  Assessment: Patient is a 48 y.o M with hx history type B aortic dissection status post endoluminal stent grafting and ramus branch intervention a year ago, left AMA from [facility] on 7/11 at noon while he was being evaluated for CP and elevated troponin. He presented to [facility] two hours after he left [facility] with c/o CP.  Pharmacy consulted for heparin drip for ACS.  Heparin level now therapeutic at 0.33 after rate increase to 1050 units/hr. H/H wnl, pltc 143  Goal of Therapy:  Heparin level 0.3-0.7 units/ml Monitor platelets by anticoagulation protocol: Yes   Plan:  - Continue heparin drip at 1050 units/hr - Check confirmatory 6 hr heparin level - Daily heparin level and CBC - Monitor for s/s bleeding  Thanks for allowing pharmacy to be a part of this patient's care.  Vertis Kelch, PharmD PGY2 Cardiology Pharmacy Resident Phone [phone] 09/25/2018       12:25  PM  Please check [email or web address] for unit-specific pharmacist phone numbers

## 2018-09-25 NOTE — Progress Notes (Signed)
[place] for heparin Indication: chest pain/ACS  Allergies  Allergen Reactions  . Imdur [Isosorbide Nitrate] Other (See Comments)    Headaches   . Tramadol Other (See Comments)    Headaches     Patient Measurements: weight 69 kg, height 71 inches Height: 5\' 11"  (180.3 cm) Weight: 153 lb (69.4 kg) IBW/kg (Calculated) : 75.3 Heparin Dosing Weight: 69 kg  Vital Signs: Temp: 97.7 F (36.5 C) (07/12 1500) Temp Source: Oral (07/12 1500) BP: 165/102 (07/12 1500) Pulse Rate: 68 (07/12 1500)  Labs: Recent Labs    09/23/18 2050  09/24/18 1341 09/24/18 1430 09/25/18 0309 09/25/18 1129 09/25/18 1712  HGB 14.5  --  15.1  --  14.4  --   --   HCT 43.5  --  46.9  --  42.5  --   --   PLT 132*  --  123*  --  143*  --   --   HEPARINUNFRC  --   --   --   --  0.21* 0.33 0.27*  CREATININE 2.17*  --  2.05*  --  2.11*  --   --   TROPONINIHS 53*   < > 848* 878.0* 2,314*  --   --    < > = values in this interval not displayed.    Estimated Creatinine Clearance: 42.9 mL/min (A) (by C-G formula based on SCr of 2.11 mg/dL (H)).  Assessment: Patient is a 48 y.o M with hx history type B aortic dissection status post endoluminal stent grafting and ramus branch intervention a year ago, left AMA from [facility] on 7/11 at noon while he was being evaluated for CP and elevated troponin. He presented to [facility] two hours after he left [facility] with c/o CP.  Pharmacy consulted for heparin drip for ACS.  Heparin level subtherapeutic at 0.21 on 1050 units/hr.  Goal of Therapy:  Heparin level 0.3-0.7 units/ml Monitor platelets by anticoagulation protocol: Yes   Plan:  - Increase heparin drip to 1200 units/hr - Check heparin level in AM, daily CBC - Monitor for s/s bleeding  Thanks for allowing pharmacy to be a part of this patient's care.  Richardine Service, PharmD PGY1 Pharmacy Resident Direct Line: [phone] 09/25/2018       6:59 PM  Please check [email or web address] for  unit-specific pharmacist phone numbers

## 2018-09-25 NOTE — Progress Notes (Signed)
CRITICAL VALUE ALERT  Critical Value:  Troponin 2314  Date & Time Notied:  09/25/18 0530  Provider Notified: TRH Blount  Orders Received/Actions taken: none/awaiting

## 2018-09-25 NOTE — Progress Notes (Signed)
Walked in to see if fiorcet effective, pt sleeping, when he awoke complained that the medication was not effective. Will notify doctor and continue to monitor.

## 2018-09-25 NOTE — Progress Notes (Signed)
Fiorcet given to patient for his severe headache, states would prefer morphine but would try the fiorcet to help his headache.

## 2018-09-25 NOTE — Progress Notes (Signed)
[place] for heparin Indication: chest pain/ACS  Allergies  Allergen Reactions  . Imdur [Isosorbide Nitrate] Other (See Comments)    Headaches   . Tramadol Other (See Comments)    Headaches     Patient Measurements: weight 69 kg, height 71 inches Height: 5\' 11"  (180.3 cm) Weight: 153 lb (69.4 kg) IBW/kg (Calculated) : 75.3 Heparin Dosing Weight: 69 kg  Vital Signs: Temp: 98.3 F (36.8 C) (07/12 0353) Temp Source: Oral (07/12 0353) BP: 173/100 (07/12 0353) Pulse Rate: 84 (07/12 0353)  Labs: Recent Labs    09/23/18 2050  09/24/18 0404 09/24/18 1341 09/24/18 1430 09/25/18 0309  HGB 14.5  --   --  15.1  --  14.4  HCT 43.5  --   --  46.9  --  42.5  PLT 132*  --   --  123*  --  143*  HEPARINUNFRC  --   --   --   --   --  0.21*  CREATININE 2.17*  --   --  2.05*  --  2.11*  TROPONINIHS 53*   < > 194* 848* 878.0*  --    < > = values in this interval not displayed.    Estimated Creatinine Clearance: 42.9 mL/min (A) (by C-G formula based on SCr of 2.11 mg/dL (H)).  Assessment: Patient is a 48 y.o M with hx history type B aortic dissection status post endoluminal stent grafting and ramus branch intervention a year ago, left AMA from [facility] on 7/11 at noon while he was being evaluated for CP and elevated troponin. He presented to [facility] two hours after he left [facility] with c/o CP.  To start heparin drip for ACS.  Initial heparin level 0.21 units/ml  Goal of Therapy:  Heparin level 0.3-0.7 units/ml Monitor platelets by anticoagulation protocol: Yes   Plan:  - Increase heparin drip to 850 units/hr -check 6 hr heparin level - monitor for s/s bleeding  Thanks for allowing pharmacy to be a part of this patient's care.  Excell Seltzer, PharmD Clinical Pharmacist 09/25/2018,4:13 AM

## 2018-09-25 NOTE — Plan of Care (Signed)

## 2018-09-25 NOTE — Consult Note (Signed)
Cardiology Consultation:   Patient ID: [name] MRN: [id number]; DOB: 17-May-1970  Admit date: 09/24/2018 Date of Consult: 09/25/2018  Primary Care Provider: Clent Demark, PA-C Primary Cardiologist: [name], MD  Primary Electrophysiologist:  None    Patient Profile:   [name] is a 48 y.o. male with a hx of coronary disease aortic dissection who is being seen today for the evaluation of myocardial infarction at the request of Dr Earnest Conroy.  History of Present Illness:   [name] is a 48 year old male with a complex medical history with aortic dissection status post Bentall procedure at [facility] with bioprosthetic aortic valve and endoluminal stent grafting here with chest discomfort left arm pain and elevated troponin consistent with non-ST elevation myocardial infarction.  Currently chest pain-free.  He did have a headache earlier this morning relieved with morphine.  CT scan of chest was performed to exclude worsening dissection.  This was stable.  IV heparin was started.  Troponin has risen to 2000.  Yesterday he got upset and left AMA, went over to [facility] then was transported back here.  Heart Pathway Score:     Past Medical History:  Diagnosis Date   Adenomatous colon polyp 08/2015   Anxiety    Aortic disease ([place])    Aortic dissection (H[facility])    a. admx 04/2014 >> L renal infarct; a/c renal failure >> b.  s/p Bioprosthetic Bentall and total arch replacement and staged endovascular repair of descending aortic aneurysm ([facility] - Dr. [name])   CAD (coronary artery disease)    a. LHC 4/16:  oD1 60%   Cardiomyopathy ([place])    a. non-ischemic - probably related to untreated HTN and ETOH abuse - Echo 3/13 with EF 35-40% >> b. Echo 4/16: Severe LVH, EF 55-60%, moderate AI, moderate MR, mild LAE, trivial effusion, known type B dissection with communication between true and false lumens with suprasternal images suggesting dissection plane may propagate  to at least left subclavian takeoff, root above aortic valve okay     Chronic abdominal pain    Chronic combined systolic and diastolic congestive heart failure ([place])    a. 05/2011: Adm with pulm edema/HTN urgency, EF 35-40% with diffuse hypokinesis and moderate to severe mitral regurgitation. Cardiomyopathy likely due to uncontrolled HTN and ETOH abuse - cath deferred due to renal insufficiency (felt due to uncontrolled HTN). bJodie Echevaria MV 06/2011: EF 37% and no ischemia or infarction. c. EF 45-50% by echo 01/2012.   Chronic sinusitis    CKD (chronic kidney disease)    a. 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  peak creatinine 3.46 during admx for aortic dissection 2/16.  sees Dr [name]   DDD (degenerative disc disease), lumbar    Depression    Descending thoracic aortic aneurysm [facility])    Dissecting aneurysm of thoracic aorta ([place])    ETOH abuse    a. Reported to have quit 05/2011.   Frequent headaches    GERD (gastroesophageal reflux disease)    Headache(784.0)    "q other day" (08/08/2013)   Heart murmur    Hemorrhoid thrombosis    History of echocardiogram    Echo 1/17:  Severe LVH, EF 55-60%, no RWMA, Gr 2 DD, AVR ok, mild to mod MR, mild LAE, mild reduced RVSF, mod RAE   History of medication noncompliance    HYPERLIPIDEMIA    Hypertension    a. Hx of HTN urgency secondary to noncompliance. b. urinary metanephrine and catecholeamine levels normal 2013.  c. Renal art [name] 1/16:  No evidence of renal artery stenosis noted bilaterally.   INGUINAL HERNIA    Pneumonia ~ 2013   Renal insufficiency    Serrated adenoma of colon 08/2015   Stroke [facility])    Tobacco abuse    Valvular heart disease    a. Echo 05/2011: moderate to severe eccentric MR and mild to moderate AI with prolapsing left coronary cusp. b. Echo 01/2012: mild-mod AI, mild dilitation of aortic root, mild MR.;  c. Echo 1/16: Severe LVH consistent with hypertrophic cardio myopathy, EF 50%, no RWMA, mod AI, mild  MR, mild RAE, dilated Ao root (40 mm);     Past Surgical History:  Procedure Laterality Date   ANKLE SURGERY Bilateral    Fractures bilaterally   AORTIC VALVE SURGERY  09/2014   CORONARY ANGIOGRAPHY N/A 12/06/2017   Procedure: CORONARY ANGIOGRAPHY;  Surgeon: Belva Crome, MD;  Location: [place] CV LAB;  Service: Cardiovascular;  Laterality: N/A;   CORONARY STENT INTERVENTION N/A 12/10/2017   Procedure: CORONARY STENT INTERVENTION;  Surgeon: [name] Sine, MD;  Location: [place] CV LAB;  Service: Cardiovascular;  Laterality: N/A;   FOOT FRACTURE SURGERY Bilateral 2004-2010   "got pins in both of them"   HEMORRHOID SURGERY N/A 06/15/2015   Procedure: HEMORRHOIDECTOMY;  Surgeon: Stark Klein, MD;  Location: [place];  Service: General;  Laterality: N/A;   INGUINAL HERNIA REPAIR Right ~ [place] N/A 06/21/2014   Procedure: LEFT HEART CATHETERIZATION WITH CORONARY ANGIOGRAM;  Surgeon: Larey Dresser, MD;  Location: [facility] CATH LAB;  Service: Cardiovascular;  Laterality: N/A;   LOOP RECORDER INSERTION N/A 06/19/2016   Procedure: Loop Recorder Insertion;  Surgeon: Deboraha Sprang, MD;  Location: [place] CV LAB;  Service: Cardiovascular;  Laterality: N/A;   TEE WITHOUT CARDIOVERSION N/A 03/12/2016   Procedure: TRANSESOPHAGEAL ECHOCARDIOGRAM (TEE);  Surgeon: Sanda Klein, MD;  Location: [facility] ENDOSCOPY;  Service: Cardiovascular;  Laterality: N/A;     Home Medications:  Prior to Admission medications   Medication Sig Start Date End Date Taking? Authorizing Provider  hydrALAZINE (APRESOLINE) 100 MG tablet Take 100 mg by mouth 2 (two) times daily.   Yes [provider]  acetaminophen (TYLENOL) 325 MG tablet Take 650 mg by mouth every 6 (six) hours as needed (for pain or headaches).     [provider]  amLODipine (NORVASC) 10 MG tablet Take 1 tablet (10 mg total) by mouth daily. 07/29/18   Larey Dresser, MD  aspirin EC  81 MG EC tablet Take 1 tablet (81 mg total) by mouth daily. 12/12/17   [facility], Tami Lin, PA  carvedilol (COREG) 25 MG tablet Take 1 tablet (25 mg total) by mouth 2 (two) times daily with a meal. 12/11/17   [facility], Tami Lin, PA  cloNIDine (CATAPRES - DOSED IN MG/24 HR) 0.3 mg/24hr patch Place 1 patch (0.3 mg total) onto the skin once a week. Patient taking differently: Place 0.3 mg onto the skin every Saturday.  09/23/18   Larey Dresser, MD  clopidogrel (PLAVIX) 75 MG tablet Take 1 tablet (75 mg total) by mouth daily with breakfast. Patient not taking: Reported on 09/23/2018 12/12/17   Ledora Bottcher, PA  ezetimibe (ZETIA) 10 MG tablet Take 1 tablet (10 mg total) by mouth daily. 08/03/18 11/01/18  Larey Dresser, MD  furosemide (LASIX) 40 MG tablet Take one tablet daily as needed for shortness of breath or weight gain  of more than 3 lbs in one day. Patient taking differently: Take 40 mg by mouth daily as needed (shortness of breath or weight gain of more than 3 lbs in one day).  12/11/17   [facility], Tami Lin, PA  pantoprazole (PROTONIX) 40 MG tablet Take 1 tablet (40 mg total) by mouth daily. 08/18/18   Ladene Artist, MD  rosuvastatin (CRESTOR) 40 MG tablet Take 1 tablet (40 mg total) by mouth daily. 05/09/18 11/10/18  Larey Dresser, MD    Inpatient Medications: Scheduled Meds:  amLODipine  10 mg Oral Daily   aspirin EC  81 mg Oral Daily   carvedilol  25 mg Oral BID WC   ezetimibe  10 mg Oral Daily   hydrALAZINE  100 mg Oral Q8H   nitroGLYCERIN  0.5 inch Topical Q6H   pantoprazole  40 mg Oral Daily   rosuvastatin  40 mg Oral Daily   sodium chloride flush  3 mL Intravenous Once   Continuous Infusions:  sodium chloride     heparin 1,050 Units/hr (09/25/18 0416)   PRN Meds: acetaminophen **OR** acetaminophen, HYDROcodone-acetaminophen, labetalol, ondansetron **OR** ondansetron (ZOFRAN) IV, polyethylene glycol  Allergies:    Allergies  Allergen Reactions   Imdur  [Isosorbide Nitrate] Other (See Comments)    Headaches    Tramadol Other (See Comments)    Headaches     Social History:   Social History   Socioeconomic History   Marital status: Married    Spouse name: Not on file   Number of children: 5   Years of education: 12   Highest education level: Not on file  Occupational History   Occupation: Disabled, part-time Programmer, systems    Comment: Disability  Social Designer, fashion/clothing strain: Not hard at all   Food insecurity    Worry: Never true    Inability: Never true   Transportation needs    Medical: No    Non-medical: No  Tobacco Use   Smoking status: Current Some Day Smoker    Packs/day: 0.25    Years: 23.00    Pack years: 5.75    Types: Cigarettes   Smokeless tobacco: Never Used   Tobacco comment: 3 to 4 per day  Substance and Sexual Activity   Alcohol use: Yes    Alcohol/week: 1.0 standard drinks    Types: 1 Cans of beer per week    Comment: occasionally   Drug use: Yes    Types: Marijuana    Comment: 2 times/week   Sexual activity: Yes  Lifestyle   Physical activity    Days per week: Not on file    Minutes per session: Not on file   Stress: Not on file  Relationships   Social connections    Talks on phone: Not on file    Gets together: Not on file    Attends religious service: Not on file    Active member of club or organization: Not on file    Attends meetings of clubs or organizations: Not on file    Relationship status: Not on file   Intimate partner violence    Fear of current or ex partner: Not on file    Emotionally abused: Not on file    Physically abused: Not on file    Forced sexual activity: Not on file  Other Topics Concern   Not on file  Social History Narrative   Fun: Enjoy his children    Family History:    Family  History  Problem Relation Age of Onset   Hypertension Mother    Hypertension Other    Colon cancer Paternal Uncle    Stroke Maternal Aunt     Heart attack Brother    Diabetes Maternal Aunt    Lung cancer Maternal Uncle    Other Sister        "breathing machine at night"   Headache Sister    Thyroid disease Sister    Throat cancer Neg Hx    Pancreatic cancer Neg Hx    Esophageal cancer Neg Hx    Kidney disease Neg Hx    Liver disease Neg Hx      ROS:  Please see the history of present illness.  Denies fevers cough chills syncope orthopnea PND All other ROS reviewed and negative.     Physical Exam/Data:   Vitals:   09/25/18 0900 09/25/18 1000 09/25/18 1043 09/25/18 1100  BP: (!) 177/103 (!) 166/109 (!) 157/98 (!) 151/98  Pulse: 71 77 65 61  Resp:      Temp:   98 F (36.7 C)   TempSrc:   Oral   SpO2: 96% 93% 95% 96%  Weight:      Height:        Intake/Output Summary (Last 24 hours) at 09/25/2018 1347 Last data filed at 09/25/2018 1100 Gross per 24 hour  Intake 354.61 ml  Output 825 ml  Net -470.39 ml   Last 3 Weights 09/24/2018 09/24/2018 09/24/2018  Weight (lbs) 153 lb 153 lb 154 lb  Weight (kg) 69.4 kg 69.4 kg 69.854 kg     Body mass index is 21.34 kg/m.  General:  Well nourished, well developed, in no acute distress HEENT: normal Lymph: no adenopathy Neck: no JVD Endocrine:  No thryomegaly Vascular: No carotid bruits; FA pulses 2+ bilaterally without bruits  Cardiac:  normal S1, click S2 (non mechanical valve); RRR; no murmur Lungs:  clear to auscultation bilaterally, no wheezing, rhonchi or rales  Abd: soft, nontender, no hepatomegaly  Ext: no edema Musculoskeletal:  No deformities, BUE and BLE strength normal and equal Skin: warm and dry  Neuro:  CNs 2-12 intact, no focal abnormalities noted Psych:  Normal affect   EKG:  The EKG was personally reviewed and demonstrates: LVH with deep T wave inversions noted with repolarization abnormalities.  No significant change from prior.  Telemetry:  Telemetry was personally reviewed and demonstrates: Normal sinus rhythm  Relevant CV  Studies:  Cath 12/10/17:  Prox LAD to Mid LAD lesion is 30% stenosed.  Prox Cx lesion is 50% stenosed.  Ost Ramus lesion is 90% stenosed.  Post intervention, there is a 0% residual stenosis.  A stent was successfully placed.   Very difficult but successful PCI to a 90% napkin ring near ostial stenosis in the  ramus intermediate vessel treated  with Cutting Balloon and ultimate stenting with a 4.0 x 12 mm Resolute DES stent postdilated to 4.14 mm with the stenosis being reduced to 0%.  RECOMMENDATION: Patient will be hydrated post procedure.  In several hours he will be given a dose of intravenous furosemide to reduce potential for volume overload.  He had baseline kidney insufficiency with creatinine of 2.23.  Renal function will need to be monitored very closely.  High potency statin therapy.  Medical therapy for concomitant CAD and heart failure with reduced ejection fraction.  Smoking cessation is imperative.  Recommend uninterrupted dual antiplatelet therapy with Aspirin 81mg  daily and Clopidogrel 75mg  daily for a minimum of  12 months (ACS - Class I recommendation).  Diagnostic Dominance: Right  Intervention     ECHO 09/25/18   1. The left ventricle has mildly reduced systolic function, with an ejection fraction of 45-50%. The cavity size was normal. There is severe concentric left ventricular hypertrophy. Left ventricular diastolic Doppler parameters are consistent with  impaired relaxation.  2. The LV is severely thickened and has a bright speckled pattern. Consider amyloidosis.  3. The right ventricle has normal systolic function. The cavity was normal. There is moderately increased right ventricular wall thickness.  4. The mitral valve is abnormal. Mild thickening of the mitral valve leaflet. No evidence of mitral valve stenosis.  5. The tricuspid valve is grossly normal.  6. No stenosis of the aortic valve.  7. There is mild dilatation of the ascending aorta.  8. The  average left ventricular global longitudinal strain is -8.3 %.  9. The interatrial septum was not assessed.  Laboratory Data:  High Sensitivity Troponin:   Recent Labs  Lab 09/24/18 0138 09/24/18 0404 09/24/18 1341 09/24/18 1430 09/25/18 0309  TROPONINIHS 81* 194* 848* 878.0* 2,314*     Cardiac EnzymesNo results for input(s): TROPONINI in the last 168 hours. No results for input(s): TROPIPOC in the last 168 hours.  Chemistry Recent Labs  Lab 09/23/18 2050 09/24/18 1341 09/25/18 0309  NA 141 138 136  K 3.6 3.7 3.5  CL 109 104 104  CO2 22 21* 24  GLUCOSE 89 79 108*  BUN 16 21* 21*  CREATININE 2.17* 2.05* 2.11*  CALCIUM 9.2 9.1 8.9  GFRNONAA 35* 38* 36*  GFRAA 41* 44* 42*  ANIONGAP 10 13 8     No results for input(s): PROT, ALBUMIN, AST, ALT, ALKPHOS, BILITOT in the last 168 hours. Hematology Recent Labs  Lab 09/23/18 2050 09/24/18 1341 09/25/18 0309  WBC 6.8 7.0 6.3  RBC 4.61 4.84 4.58  HGB 14.5 15.1 14.4  HCT 43.5 46.9 42.5  MCV 94.4 96.9 92.8  MCH 31.5 31.2 31.4  MCHC 33.3 32.2 33.9  RDW 13.9 14.1 13.7  PLT 132* 123* 143*   BNPNo results for input(s): BNP, PROBNP in the last 168 hours.  DDimer No results for input(s): DDIMER in the last 168 hours.   Radiology/Studies:  Dg Chest 2 View  Result Date: 09/24/2018 CLINICAL DATA:  Chest pain, headache, hypertension EXAM: CHEST - 2 VIEW COMPARISON:  09/23/2018 FINDINGS: Unchanged examination. No acute abnormality of the lungs. Cardiomegaly status post median sternotomy with surgical aortic annulus and extensive multi component stent endograft repair of the aorta. IMPRESSION: Unchanged examination. No acute abnormality of the lungs. Cardiomegaly status post median sternotomy with surgical aortic annulus and extensive multi component stent endograft repair of the aorta. Electronically Signed   By: Eddie Candle M.D.   On: 09/24/2018 13:10   Dg Chest 2 View  Result Date: 09/23/2018 CLINICAL DATA:  Chest pain EXAM:  CHEST - 2 VIEW COMPARISON:  09/14/2018, 03/25/2018, CT 12/02/2017, radiograph 01/31/2017 FINDINGS: Post sternotomy changes with aortic valve and root prostheses. Similar appearance of thoracic aortic stent graft. Stable pleural and parenchymal scarring at the left base. No acute airspace disease or effusion. No pneumothorax. Stable mild cardiomegaly. IMPRESSION: No active cardiopulmonary disease. Stable postsurgical changes of the mediastinum with pleural and parenchymal scarring at the left base Electronically Signed   By: Donavan Foil M.D.   On: 09/23/2018 21:10   Ct Angio Chest/abd/pel For Dissection W And/or W/wo  Result Date: 09/24/2018 CLINICAL DATA:  History of aortic  dissection with repair EXAM: CT ANGIOGRAPHY CHEST, ABDOMEN AND PELVIS TECHNIQUE: Multidetector CT imaging through the chest, abdomen and pelvis was performed using the standard protocol during bolus administration of intravenous contrast. Multiplanar reconstructed images and MIPs were obtained and reviewed to evaluate the vascular anatomy. CONTRAST:  58mL OMNIPAQUE 350 COMPARISON:  12/02/2017 FINDINGS: CTA CHEST FINDINGS Cardiovascular: Aortic valve replacement is again identified and stable in appearance. Aortic root is stable in appearance measuring approximately 4.2 cm in greatest dimension. The ascending aortic graft is again identified with a stable appearance with Elephant graft giving rise to the brachiocephalic vessels. No focal narrowing is noted. Just beyond the Elephant graft, there is evidence of stent graft placement extending through the aortic arch and into the descending thoracic aorta. No endoleak is identified. The pulmonary artery appears within normal limits although opacification is limited with regards to pulmonary embolism. No pericardial effusion is seen. No other focal abnormality is noted. Mediastinum/Nodes: Thoracic inlet shows no acute abnormality. No sizable hilar or mediastinal adenopathy is noted. The  esophagus as visualized is within normal limits. Lungs/Pleura: Lungs are well aerated bilaterally. Right lung shows no focal infiltrate or sizable effusion. Mild scarring remains at the left lung base similar to that seen on prior exam. No parenchymal nodules are seen. No pneumothorax is noted. Musculoskeletal: Degenerative changes of the thoracic spine are noted. Postsurgical changes consistent with median sternotomy are seen. No acute rib abnormality is noted. Review of the MIP images confirms the above findings. CTA ABDOMEN AND PELVIS FINDINGS VASCULAR Aorta: The known thoracic aortic stent graft extends into the proximal abdominal aorta. A an implanted graft to the celiac axis is identified with patency of the branches of the celiac axis. Additionally graft to the superior mesenteric artery is noted as well. Tortuous graft to the left renal artery is noted. A more inline graft to the right renal artery is noted as well. These are patent. 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 No enhancement is identified. No recurrent dissection is seen. Calcifications of the more distal aorta are noted. Celiac: Widely patent from graft arising from the proximal aorta. SMA: Widely patent from graft arising from the proximal aorta. Renals: Single renal arteries are identified following grafting to the proximal aorta. IMA: Patent without evidence of aneurysm, dissection, vasculitis or significant stenosis. Iliacs: Atherosclerotic calcifications of the iliac vessels are noted. No focal stenosis is seen. 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 Veins: No venous abnormality is noted. Review of the MIP images confirms the above findings. NON-VASCULAR Hepatobiliary: No focal liver abnormality is seen. No gallstones, gallbladder wall thickening, or biliary dilatation. Pancreas: Unremarkable. No  pancreatic ductal dilatation or surrounding inflammatory changes. Spleen: Normal in size without focal abnormality. Adrenals/Urinary Tract: Adrenal glands are within normal limits. Kidneys demonstrate a normal enhancement pattern bilaterally. No obstructive changes are seen. Mild scarring is noted in the left kidney stable from the previous exam. The bladder is well distended. Stomach/Bowel: The appendix is within normal limits. No obstructive or inflammatory changes of large or small bowel is noted. Stomach is within normal limits. Lymphatic: No lymphatic abnormality is noted. Reproductive: Prostate is unremarkable. Other: No abdominal wall hernia or abnormality. No abdominopelvic ascites. Musculoskeletal: No acute or significant osseous findings. Review of the MIP images confirms the above findings. IMPRESSION: Stable postoperative appearance when compared with the prior exam. Extensive reconstruction of the  ascending aorta is seen with aortic valve and elephant graft supplying the brachiocephalic vessels. Thoracic stent graft is noted in place without evidence of endoleak. Extensive postoperative change in the proximal abdominal aorta with patent C2 all of the visceral grafts identified. 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 No acute dissection is seen. No areas of focal hemorrhage are identified. No acute abnormality within the chest or abdomen is noted. Electronically Signed   By: Inez Catalina M.D.   On: 09/24/2018 17:40    Assessment and Plan:   Non-ST elevation myocardial infarction-high-sensitivity troponin 2314 - Known CAD with ramus, ostial stent. - IV heparin currently.  No evidence of new dissection. - N.p.o. past midnight for cardiac catheterization.  Risks and benefits have been explained including stroke heart attack death renal impairment bleeding.  Creatinine is 2.1.  We will start  hydration. - Symptoms of chest discomfort with left arm discomfort reminded him of his prior symptoms. - Minimize dye exposure. -May 2020 nuclear stress test with no ischemic changes. -Continue to control blood pressure  Uncontrolled hypertension - Improved on home meds.  Needs continued aggressive management.  CKD 3 - Avoid nephrotoxic agents.  Hydrate prior to cardiac catheterization.  History of aortic dissection, bioprosthetic Bentall and total arch replacement Dr. [name] at [facility]. - Endoluminal stent grafting approximately 1 year ago.  Stable on CT.  Ischemic/nonischemic cardiomyopathy -Prior EF in the 45% range.  Appears euvolemic.        For questions or updates, please contact [place] Please consult [email or web address] for contact info under     Signed, Candee Furbish, MD  09/25/2018 1:47 PM

## 2018-09-25 NOTE — Progress Notes (Signed)
Morphine 1mg  iv given. Ten minutes after administration of morphine patient stated that his pain was relieved.

## 2018-09-25 NOTE — Progress Notes (Signed)
Pt arrived to unit via [facility]. VS and initial assessment complete. Awaiting orders. Will continue to monitor.

## 2018-09-25 NOTE — Progress Notes (Signed)
   09/25/18 1400  Vitals  BP (!) 168/95  MAP (mmHg) 115  Pulse Rate 61  ECG Heart Rate 61  Oxygen Therapy  SpO2 96 %  MEWS Score  MEWS RR 2  MEWS Pulse 0  MEWS Systolic 0  MEWS LOC 0  MEWS Temp 0  MEWS Score 2  MEWS Score Color Yellow   hydralazine 10mg  given

## 2018-09-25 NOTE — H&P (View-Only) (Signed)
Cardiology Consultation:   Patient ID: [name] MRN: [id number]; DOB: Jun 12, 1970  Admit date: 09/24/2018 Date of Consult: 09/25/2018  Primary Care Provider: Clent Demark, PA-C Primary Cardiologist: [name], MD  Primary Electrophysiologist:  None    Patient Profile:   [name] is a 48 y.o. male with a hx of coronary disease aortic dissection who is being seen today for the evaluation of myocardial infarction at the request of Dr Earnest Conroy.  History of Present Illness:   Mr. [name] is a 48 year old male with a complex medical history with aortic dissection status post Bentall procedure at [facility] with bioprosthetic aortic valve and endoluminal stent grafting here with chest discomfort left arm pain and elevated troponin consistent with non-ST elevation myocardial infarction.  Currently chest pain-free.  He did have a headache earlier this morning relieved with morphine.  CT scan of chest was performed to exclude worsening dissection.  This was stable.  IV heparin was started.  Troponin has risen to 2000.  Yesterday he got upset and left AMA, went over to [facility] long hospital then was transported back here.  Heart Pathway Score:     Past Medical History:  Diagnosis Date   Adenomatous colon polyp 08/2015   Anxiety    Aortic disease ([place])    Aortic dissection (HCC)    a. admx 04/2014 >> L renal infarct; a/c renal failure >> b.  s/p Bioprosthetic Bentall and total arch replacement and staged endovascular repair of descending aortic aneurysm ([facility] - Dr. [name])   CAD (coronary artery disease)    a. LHC 4/16:  oD1 60%   Cardiomyopathy ([place])    a. non-ischemic - probably related to untreated HTN and ETOH abuse - Echo 3/13 with EF 35-40% >> b. Echo 4/16: Severe LVH, EF 55-60%, moderate AI, moderate MR, mild LAE, trivial effusion, known type B dissection with communication between true and false lumens with suprasternal images suggesting dissection plane may propagate  to at least left subclavian takeoff, root above aortic valve okay     Chronic abdominal pain    Chronic combined systolic and diastolic congestive heart failure ([place])    a. 05/2011: Adm with pulm edema/HTN urgency, EF 35-40% with diffuse hypokinesis and moderate to severe mitral regurgitation. Cardiomyopathy likely due to uncontrolled HTN and ETOH abuse - cath deferred due to renal insufficiency (felt due to uncontrolled HTN). bJodie Echevaria MV 06/2011: EF 37% and no ischemia or infarction. c. EF 45-50% by echo 01/2012.   Chronic sinusitis    CKD (chronic kidney disease)    a. 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  peak creatinine 3.46 during admx for aortic dissection 2/16.  sees Dr [name]   DDD (degenerative disc disease), lumbar    Depression    Descending thoracic aortic aneurysm [facility])    Dissecting aneurysm of thoracic aorta ([place])    ETOH abuse    a. Reported to have quit 05/2011.   Frequent headaches    GERD (gastroesophageal reflux disease)    Headache(784.0)    "q other day" (08/08/2013)   Heart murmur    Hemorrhoid thrombosis    History of echocardiogram    Echo 1/17:  Severe LVH, EF 55-60%, no RWMA, Gr 2 DD, AVR ok, mild to mod MR, mild LAE, mild reduced RVSF, mod RAE   History of medication noncompliance    HYPERLIPIDEMIA    Hypertension    a. Hx of HTN urgency secondary to noncompliance. b. urinary metanephrine and catecholeamine levels normal 2013.  c. Renal art [name] 1/16:  No evidence of renal artery stenosis noted bilaterally.   INGUINAL HERNIA    Pneumonia ~ 2013   Renal insufficiency    Serrated adenoma of colon 08/2015   Stroke [facility])    Tobacco abuse    Valvular heart disease    a. Echo 05/2011: moderate to severe eccentric MR and mild to moderate AI with prolapsing left coronary cusp. b. Echo 01/2012: mild-mod AI, mild dilitation of aortic root, mild MR.;  c. Echo 1/16: Severe LVH consistent with hypertrophic cardio myopathy, EF 50%, no RWMA, mod AI, mild  MR, mild RAE, dilated Ao root (40 mm);     Past Surgical History:  Procedure Laterality Date   ANKLE SURGERY Bilateral    Fractures bilaterally   AORTIC VALVE SURGERY  09/2014   CORONARY ANGIOGRAPHY N/A 12/06/2017   Procedure: CORONARY ANGIOGRAPHY;  Surgeon: Belva Crome, MD;  Location: [place] CV LAB;  Service: Cardiovascular;  Laterality: N/A;   CORONARY STENT INTERVENTION N/A 12/10/2017   Procedure: CORONARY STENT INTERVENTION;  Surgeon: [name] Sine, MD;  Location: [place] CV LAB;  Service: Cardiovascular;  Laterality: N/A;   FOOT FRACTURE SURGERY Bilateral 2004-2010   "got pins in both of them"   HEMORRHOID SURGERY N/A 06/15/2015   Procedure: HEMORRHOIDECTOMY;  Surgeon: Stark Klein, MD;  Location: [place];  Service: General;  Laterality: N/A;   INGUINAL HERNIA REPAIR Right ~ [employer] N/A 06/21/2014   Procedure: LEFT HEART CATHETERIZATION WITH CORONARY ANGIOGRAM;  Surgeon: Larey Dresser, MD;  Location: [facility] CATH LAB;  Service: Cardiovascular;  Laterality: N/A;   LOOP RECORDER INSERTION N/A 06/19/2016   Procedure: Loop Recorder Insertion;  Surgeon: Deboraha Sprang, MD;  Location: [place] CV LAB;  Service: Cardiovascular;  Laterality: N/A;   TEE WITHOUT CARDIOVERSION N/A 03/12/2016   Procedure: TRANSESOPHAGEAL ECHOCARDIOGRAM (TEE);  Surgeon: Sanda Klein, MD;  Location[facility] ENDOSCOPY;  Service: Cardiovascular;  Laterality: N/A;     Home Medications:  Prior to Admission medications   Medication Sig Start Date End Date Taking? Authorizing Provider  hydrALAZINE (APRESOLINE) 100 MG tablet Take 100 mg by mouth 2 (two) times daily.   Yes [provider]  acetaminophen (TYLENOL) 325 MG tablet Take 650 mg by mouth every 6 (six) hours as needed (for pain or headaches).     [provider]  amLODipine (NORVASC) 10 MG tablet Take 1 tablet (10 mg total) by mouth daily. 07/29/18   Larey Dresser, MD  aspirin EC  81 MG EC tablet Take 1 tablet (81 mg total) by mouth daily. 12/12/17   [facility], Tami Lin, PA  carvedilol (COREG) 25 MG tablet Take 1 tablet (25 mg total) by mouth 2 (two) times daily with a meal. 12/11/17   [facility], Tami Lin, PA  cloNIDine (CATAPRES - DOSED IN MG/24 HR) 0.3 mg/24hr patch Place 1 patch (0.3 mg total) onto the skin once a week. Patient taking differently: Place 0.3 mg onto the skin every Saturday.  09/23/18   Larey Dresser, MD  clopidogrel (PLAVIX) 75 MG tablet Take 1 tablet (75 mg total) by mouth daily with breakfast. Patient not taking: Reported on 09/23/2018 12/12/17   Ledora Bottcher, PA  ezetimibe (ZETIA) 10 MG tablet Take 1 tablet (10 mg total) by mouth daily. 08/03/18 11/01/18  Larey Dresser, MD  furosemide (LASIX) 40 MG tablet Take one tablet daily as needed for shortness of breath or weight gain  of more than 3 lbs in one day. Patient taking differently: Take 40 mg by mouth daily as needed (shortness of breath or weight gain of more than 3 lbs in one day).  12/11/17   [facility], Tami Lin, PA  pantoprazole (PROTONIX) 40 MG tablet Take 1 tablet (40 mg total) by mouth daily. 08/18/18   Ladene Artist, MD  rosuvastatin (CRESTOR) 40 MG tablet Take 1 tablet (40 mg total) by mouth daily. 05/09/18 11/10/18  Larey Dresser, MD    Inpatient Medications: Scheduled Meds:  amLODipine  10 mg Oral Daily   aspirin EC  81 mg Oral Daily   carvedilol  25 mg Oral BID WC   ezetimibe  10 mg Oral Daily   hydrALAZINE  100 mg Oral Q8H   nitroGLYCERIN  0.5 inch Topical Q6H   pantoprazole  40 mg Oral Daily   rosuvastatin  40 mg Oral Daily   sodium chloride flush  3 mL Intravenous Once   Continuous Infusions:  sodium chloride     heparin 1,050 Units/hr (09/25/18 0416)   PRN Meds: acetaminophen **OR** acetaminophen, HYDROcodone-acetaminophen, labetalol, ondansetron **OR** ondansetron (ZOFRAN) IV, polyethylene glycol  Allergies:    Allergies  Allergen Reactions   Imdur  [Isosorbide Nitrate] Other (See Comments)    Headaches    Tramadol Other (See Comments)    Headaches     Social History:   Social History   Socioeconomic History   Marital status: Married    Spouse name: Not on file   Number of children: 5   Years of education: 12   Highest education level: Not on file  Occupational History   Occupation: Disabled, part-time Programmer, systems    Comment: Disability  Social Designer, fashion/clothing strain: Not hard at all   Food insecurity    Worry: Never true    Inability: Never true   Transportation needs    Medical: No    Non-medical: No  Tobacco Use   Smoking status: Current Some Day Smoker    Packs/day: 0.25    Years: 23.00    Pack years: 5.75    Types: Cigarettes   Smokeless tobacco: Never Used   Tobacco comment: 3 to 4 per day  Substance and Sexual Activity   Alcohol use: Yes    Alcohol/week: 1.0 standard drinks    Types: 1 Cans of beer per week    Comment: occasionally   Drug use: Yes    Types: Marijuana    Comment: 2 times/week   Sexual activity: Yes  Lifestyle   Physical activity    Days per week: Not on file    Minutes per session: Not on file   Stress: Not on file  Relationships   Social connections    Talks on phone: Not on file    Gets together: Not on file    Attends religious service: Not on file    Active member of club or organization: Not on file    Attends meetings of clubs or organizations: Not on file    Relationship status: Not on file   Intimate partner violence    Fear of current or ex partner: Not on file    Emotionally abused: Not on file    Physically abused: Not on file    Forced sexual activity: Not on file  Other Topics Concern   Not on file  Social History Narrative   Fun: Enjoy his children    Family History:    Family  History  Problem Relation Age of Onset   Hypertension Mother    Hypertension Other    Colon cancer Paternal Uncle    Stroke Maternal Aunt     Heart attack Brother    Diabetes Maternal Aunt    Lung cancer Maternal Uncle    Other Sister        "breathing machine at night"   Headache Sister    Thyroid disease Sister    Throat cancer Neg Hx    Pancreatic cancer Neg Hx    Esophageal cancer Neg Hx    Kidney disease Neg Hx    Liver disease Neg Hx      ROS:  Please see the history of present illness.  Denies fevers cough chills syncope orthopnea PND All other ROS reviewed and negative.     Physical Exam/Data:   Vitals:   09/25/18 0900 09/25/18 1000 09/25/18 1043 09/25/18 1100  BP: (!) 177/103 (!) 166/109 (!) 157/98 (!) 151/98  Pulse: 71 77 65 61  Resp:      Temp:   98 F (36.7 C)   TempSrc:   Oral   SpO2: 96% 93% 95% 96%  Weight:      Height:        Intake/Output Summary (Last 24 hours) at 09/25/2018 1347 Last data filed at 09/25/2018 1100 Gross per 24 hour  Intake 354.61 ml  Output 825 ml  Net -470.39 ml   Last 3 Weights 09/24/2018 09/24/2018 09/24/2018  Weight (lbs) 153 lb 153 lb 154 lb  Weight (kg) 69.4 kg 69.4 kg 69.854 kg     Body mass index is 21.34 kg/m.  General:  Well nourished, well developed, in no acute distress HEENT: normal Lymph: no adenopathy Neck: no JVD Endocrine:  No thryomegaly Vascular: No carotid bruits; FA pulses 2+ bilaterally without bruits  Cardiac:  normal S1, click S2 (non mechanical valve); RRR; no murmur Lungs:  clear to auscultation bilaterally, no wheezing, rhonchi or rales  Abd: soft, nontender, no hepatomegaly  Ext: no edema Musculoskeletal:  No deformities, BUE and BLE strength normal and equal Skin: warm and dry  Neuro:  CNs 2-12 intact, no focal abnormalities noted Psych:  Normal affect   EKG:  The EKG was personally reviewed and demonstrates: LVH with deep T wave inversions noted with repolarization abnormalities.  No significant change from prior.  Telemetry:  Telemetry was personally reviewed and demonstrates: Normal sinus rhythm  Relevant CV  Studies:  Cath 12/10/17:  Prox LAD to Mid LAD lesion is 30% stenosed.  Prox Cx lesion is 50% stenosed.  Ost Ramus lesion is 90% stenosed.  Post intervention, there is a 0% residual stenosis.  A stent was successfully placed.   Very difficult but successful PCI to a 90% napkin ring near ostial stenosis in the  ramus intermediate vessel treated  with Cutting Balloon and ultimate stenting with a 4.0 x 12 mm Resolute DES stent postdilated to 4.14 mm with the stenosis being reduced to 0%.  RECOMMENDATION: Patient will be hydrated post procedure.  In several hours he will be given a dose of intravenous furosemide to reduce potential for volume overload.  He had baseline kidney insufficiency with creatinine of 2.23.  Renal function will need to be monitored very closely.  High potency statin therapy.  Medical therapy for concomitant CAD and heart failure with reduced ejection fraction.  Smoking cessation is imperative.  Recommend uninterrupted dual antiplatelet therapy with Aspirin 81mg  daily and Clopidogrel 75mg  daily for a minimum of  12 months (ACS - Class I recommendation).  Diagnostic Dominance: Right  Intervention     ECHO 09/25/18   1. The left ventricle has mildly reduced systolic function, with an ejection fraction of 45-50%. The cavity size was normal. There is severe concentric left ventricular hypertrophy. Left ventricular diastolic Doppler parameters are consistent with  impaired relaxation.  2. The LV is severely thickened and has a bright speckled pattern. Consider amyloidosis.  3. The right ventricle has normal systolic function. The cavity was normal. There is moderately increased right ventricular wall thickness.  4. The mitral valve is abnormal. Mild thickening of the mitral valve leaflet. No evidence of mitral valve stenosis.  5. The tricuspid valve is grossly normal.  6. No stenosis of the aortic valve.  7. There is mild dilatation of the ascending aorta.  8. The  average left ventricular global longitudinal strain is -8.3 %.  9. The interatrial septum was not assessed.  Laboratory Data:  High Sensitivity Troponin:   Recent Labs  Lab 09/24/18 0138 09/24/18 0404 09/24/18 1341 09/24/18 1430 09/25/18 0309  TROPONINIHS 81* 194* 848* 878.0* 2,314*     Cardiac EnzymesNo results for input(s): TROPONINI in the last 168 hours. No results for input(s): TROPIPOC in the last 168 hours.  Chemistry Recent Labs  Lab 09/23/18 2050 09/24/18 1341 09/25/18 0309  NA 141 138 136  K 3.6 3.7 3.5  CL 109 104 104  CO2 22 21* 24  GLUCOSE 89 79 108*  BUN 16 21* 21*  CREATININE 2.17* 2.05* 2.11*  CALCIUM 9.2 9.1 8.9  GFRNONAA 35* 38* 36*  GFRAA 41* 44* 42*  ANIONGAP 10 13 8     No results for input(s): PROT, ALBUMIN, AST, ALT, ALKPHOS, BILITOT in the last 168 hours. Hematology Recent Labs  Lab 09/23/18 2050 09/24/18 1341 09/25/18 0309  WBC 6.8 7.0 6.3  RBC 4.61 4.84 4.58  HGB 14.5 15.1 14.4  HCT 43.5 46.9 42.5  MCV 94.4 96.9 92.8  MCH 31.5 31.2 31.4  MCHC 33.3 32.2 33.9  RDW 13.9 14.1 13.7  PLT 132* 123* 143*   BNPNo results for input(s): BNP, PROBNP in the last 168 hours.  DDimer No results for input(s): DDIMER in the last 168 hours.   Radiology/Studies:  Dg Chest 2 View  Result Date: 09/24/2018 CLINICAL DATA:  Chest pain, headache, hypertension EXAM: CHEST - 2 VIEW COMPARISON:  09/23/2018 FINDINGS: Unchanged examination. No acute abnormality of the lungs. Cardiomegaly status post median sternotomy with surgical aortic annulus and extensive multi component stent endograft repair of the aorta. IMPRESSION: Unchanged examination. No acute abnormality of the lungs. Cardiomegaly status post median sternotomy with surgical aortic annulus and extensive multi component stent endograft repair of the aorta. Electronically Signed   By: Eddie Candle M.D.   On: 09/24/2018 13:10   Dg Chest 2 View  Result Date: 09/23/2018 CLINICAL DATA:  Chest pain EXAM:  CHEST - 2 VIEW COMPARISON:  09/14/2018, 03/25/2018, CT 12/02/2017, radiograph 01/31/2017 FINDINGS: Post sternotomy changes with aortic valve and root prostheses. Similar appearance of thoracic aortic stent graft. Stable pleural and parenchymal scarring at the left base. No acute airspace disease or effusion. No pneumothorax. Stable mild cardiomegaly. IMPRESSION: No active cardiopulmonary disease. Stable postsurgical changes of the mediastinum with pleural and parenchymal scarring at the left base Electronically Signed   By: Donavan Foil M.D.   On: 09/23/2018 21:10   Ct Angio Chest/abd/pel For Dissection W And/or W/wo  Result Date: 09/24/2018 CLINICAL DATA:  History of aortic  dissection with repair EXAM: CT ANGIOGRAPHY CHEST, ABDOMEN AND PELVIS TECHNIQUE: Multidetector CT imaging through the chest, abdomen and pelvis was performed using the standard protocol during bolus administration of intravenous contrast. Multiplanar reconstructed images and MIPs were obtained and reviewed to evaluate the vascular anatomy. CONTRAST:  43mL OMNIPAQUE 350 COMPARISON:  12/02/2017 FINDINGS: CTA CHEST FINDINGS Cardiovascular: Aortic valve replacement is again identified and stable in appearance. Aortic root is stable in appearance measuring approximately 4.2 cm in greatest dimension. The ascending aortic graft is again identified with a stable appearance with Elephant graft giving rise to the brachiocephalic vessels. No focal narrowing is noted. Just beyond the Elephant graft, there is evidence of stent graft placement extending through the aortic arch and into the descending thoracic aorta. No endoleak is identified. The pulmonary artery appears within normal limits although opacification is limited with regards to pulmonary embolism. No pericardial effusion is seen. No other focal abnormality is noted. Mediastinum/Nodes: Thoracic inlet shows no acute abnormality. No sizable hilar or mediastinal adenopathy is noted. The  esophagus as visualized is within normal limits. Lungs/Pleura: Lungs are well aerated bilaterally. Right lung shows no focal infiltrate or sizable effusion. Mild scarring remains at the left lung base similar to that seen on prior exam. No parenchymal nodules are seen. No pneumothorax is noted. Musculoskeletal: Degenerative changes of the thoracic spine are noted. Postsurgical changes consistent with median sternotomy are seen. No acute rib abnormality is noted. Review of the MIP images confirms the above findings. CTA ABDOMEN AND PELVIS FINDINGS VASCULAR Aorta: The known thoracic aortic stent graft extends into the proximal abdominal aorta. A an implanted graft to the celiac axis is identified with patency of the branches of the celiac axis. Additionally graft to the superior mesenteric artery is noted as well. Tortuous graft to the left renal artery is noted. A more inline graft to the right renal artery is noted as well. These are patent. 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 No enhancement is identified. No recurrent dissection is seen. Calcifications of the more distal aorta are noted. Celiac: Widely patent from graft arising from the proximal aorta. SMA: Widely patent from graft arising from the proximal aorta. Renals: Single renal arteries are identified following grafting to the proximal aorta. IMA: Patent without evidence of aneurysm, dissection, vasculitis or significant stenosis. Iliacs: Atherosclerotic calcifications of the iliac vessels are noted. No focal stenosis is seen. 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 Veins: No venous abnormality is noted. Review of the MIP images confirms the above findings. NON-VASCULAR Hepatobiliary: No focal liver abnormality is seen. No gallstones, gallbladder wall thickening, or biliary dilatation. Pancreas: Unremarkable. No  pancreatic ductal dilatation or surrounding inflammatory changes. Spleen: Normal in size without focal abnormality. Adrenals/Urinary Tract: Adrenal glands are within normal limits. Kidneys demonstrate a normal enhancement pattern bilaterally. No obstructive changes are seen. Mild scarring is noted in the left kidney stable from the previous exam. The bladder is well distended. Stomach/Bowel: The appendix is within normal limits. No obstructive or inflammatory changes of large or small bowel is noted. Stomach is within normal limits. Lymphatic: No lymphatic abnormality is noted. Reproductive: Prostate is unremarkable. Other: No abdominal wall hernia or abnormality. No abdominopelvic ascites. Musculoskeletal: No acute or significant osseous findings. Review of the MIP images confirms the above findings. IMPRESSION: Stable postoperative appearance when compared with the prior exam. Extensive reconstruction of the  ascending aorta is seen with aortic valve and elephant graft supplying the brachiocephalic vessels. Thoracic stent graft is noted in place without evidence of endoleak. Extensive postoperative change in the proximal abdominal aorta with patent C2 all of the visceral grafts identified. 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 No acute dissection is seen. No areas of focal hemorrhage are identified. No acute abnormality within the chest or abdomen is noted. Electronically Signed   By: Inez Catalina M.D.   On: 09/24/2018 17:40    Assessment and Plan:   Non-ST elevation myocardial infarction-high-sensitivity troponin 2314 - Known CAD with ramus, ostial stent. - IV heparin currently.  No evidence of new dissection. - N.p.o. past midnight for cardiac catheterization.  Risks and benefits have been explained including stroke heart attack death renal impairment bleeding.  Creatinine is 2.1.  We will start  hydration. - Symptoms of chest discomfort with left arm discomfort reminded him of his prior symptoms. - Minimize dye exposure. -May 2020 nuclear stress test with no ischemic changes. -Continue to control blood pressure  Uncontrolled hypertension - Improved on home meds.  Needs continued aggressive management.  CKD 3 - Avoid nephrotoxic agents.  Hydrate prior to cardiac catheterization.  History of aortic dissection, bioprosthetic Bentall and total arch replacement Dr. [name] at [facility]. - Endoluminal stent grafting approximately 1 year ago.  Stable on CT.  Ischemic/nonischemic cardiomyopathy -Prior EF in the 45% range.  Appears euvolemic.        For questions or updates, please contact [place] Please consult [email or web address] for contact info under     Signed, Candee Furbish, MD  09/25/2018 1:47 PM

## 2018-09-25 NOTE — Progress Notes (Signed)
Went to assess patient. Patient holding his head rocking back and front in bed complaining of severe headache. When asked to describe the headache. Stated "Feels like my head his going to explode" and that it was pounding, throbbing, radiating from his arm up to his head. Denies any blurred vision. Requested Morphine for headache states they gave him morphine in the ED and it relieved it.

## 2018-09-25 NOTE — Progress Notes (Signed)
PROGRESS NOTE    [name]  GNF:[id number]  DOB: 1970-08-17  DOA: 09/24/2018 PCP: Clent Demark, PA-C  Brief Narrative:  48 y.o. male with medical history significant for aortic dissection status post endoluminal stent grafting about a year ago, CAD s/p PTCA in 2019, NICM EF 84-69%/GEXBM 1 diastolic dysfunction, CHF, CKD, tobacco abuse, presents to the ED complaining of chest pain and headache.  Patient initially presented to [facility] early hours this morning, and signed AMA, later came back to [facility] long hospital ER having similar complains.  Patient reported left-sided chest pain radiating to his left arm, goes up to his left side of the neck and left side of the head, with some numbness in his arm. Patient reports symptoms have been going on for few weeks but preferred not to come to the hospital due to [place] pandemic.  Work-up in the ED at [facility] long showed uncontrolled blood pressure, uptrending troponins but normal EKG.  Cardiology was contacted and patient transferred to [facility] for further inpatient work-up. He had -ve stress test in May 2020 and denies recent exercise tolerance.   Subjective:  Patient states he ran out of clonidine patch 2 weeks ago but claims he is compliant with his other medications.  On the initial part of my interview he pointed to the old surgical scar on left side of his abdomen and stated he has chronic pain and that he was following pain clinic in the past.  He however was unable to give me any details about the name of the pain clinic or doctor.  He claims he last saw pain clinic/MD 1 year back. He states left sided chest pain in new which started yesterday radiating to his left arm.  He was admitted with clonidine patch and nitro patch along with oral antihypertensives.  He states he has had bad headache all day today due to Nitropatch.  Patient seen by cardiology and plan for cardiac cath in a.m. given concern for rising troponins.  Patient now  denies any chest pain but wants morphine for headache.  Patient has been argumentative with staff (nurse, me and the cardiologists) and appears to be fixated on IV morphine.  He did get 1 dose earlier today on initial phone request by nurse but was given Fioricet subsequently which he states did not help.  Objective: Vitals:   09/25/18 1100 09/25/18 1200 09/25/18 1400 09/25/18 1500  BP: (!) 151/98 (!) 158/90 (!) 168/95 (!) 165/102  Pulse: 61 65 61 68  Resp:      Temp:    97.7 F (36.5 C)  TempSrc:    Oral  SpO2: 96% 95% 96% 95%  Weight:      Height:        Intake/Output Summary (Last 24 hours) at 09/25/2018 1622 Last data filed at 09/25/2018 1500 Gross per 24 hour  Intake 858.61 ml  Output 1025 ml  Net -166.39 ml   Filed Weights   09/24/18 2130  Weight: 69.4 kg    Physical Examination:  General exam: Appears calm and comfortable with no respiratory distress Respiratory system: Clear to auscultation. Respiratory effort normal. Cardiovascular system: S1 & S2 heard, RRR. No JVD, murmurs, rubs, gallops or clicks. No pedal edema. Gastrointestinal system: Abdomen is nondistended, soft and nontender.  Old surgical scar along sternal area and abdominal scar/left para umbilical region extending to midline. No organomegaly or masses felt. Normal bowel sounds heard. Central nervous system: Alert and oriented. No focal neurological deficits. Extremities:  Symmetric 5 x 5 power. Skin: No rashes, lesions or ulcers Psychiatry: Mood & affect irritable/at times agitated    Data Reviewed: I have personally reviewed following labs and imaging studies  CBC: Recent Labs  Lab 09/23/18 2050 09/24/18 1341 09/25/18 0309  WBC 6.8 7.0 6.3  HGB 14.5 15.1 14.4  HCT 43.5 46.9 42.5  MCV 94.4 96.9 92.8  PLT 132* 123* 017*   Basic Metabolic Panel: Recent Labs  Lab 09/23/18 2050 09/24/18 1341 09/25/18 0309  NA 141 138 136  K 3.6 3.7 3.5  CL 109 104 104  CO2 22 21* 24  GLUCOSE 89 79 108*   BUN 16 21* 21*  CREATININE 2.17* 2.05* 2.11*  CALCIUM 9.2 9.1 8.9   GFR: Estimated Creatinine Clearance: 42.9 mL/min (A) (by C-G formula based on SCr of 2.11 mg/dL (H)). Liver Function Tests: No results for input(s): AST, ALT, ALKPHOS, BILITOT, PROT, ALBUMIN in the last 168 hours. No results for input(s): LIPASE, AMYLASE in the last 168 hours. No results for input(s): AMMONIA in the last 168 hours. Coagulation Profile: No results for input(s): INR, PROTIME in the last 168 hours. Cardiac Enzymes: No results for input(s): CKTOTAL, CKMB, CKMBINDEX, TROPONINI in the last 168 hours. BNP (last 3 results) No results for input(s): PROBNP in the last 8760 hours. HbA1C: No results for input(s): HGBA1C in the last 72 hours. CBG: No results for input(s): GLUCAP in the last 168 hours. Lipid Profile: No results for input(s): CHOL, HDL, LDLCALC, TRIG, CHOLHDL, LDLDIRECT in the last 72 hours. Thyroid Function Tests: No results for input(s): TSH, T4TOTAL, FREET4, T3FREE, THYROIDAB in the last 72 hours. Anemia Panel: No results for input(s): VITAMINB12, FOLATE, FERRITIN, TIBC, IRON, RETICCTPCT in the last 72 hours. Sepsis Labs: No results for input(s): PROCALCITON, LATICACIDVEN in the last 168 hours.  Recent Results (from the past 240 hour(s))  SARS Coronavirus 2 (CEPHEID - Performed in [place] hospital lab), Hosp Order     Status: None   Collection Time: 09/24/18  3:02 AM   Specimen: Nasopharyngeal Swab  Result Value Ref Range Status   SARS Coronavirus 2 NEGATIVE NEGATIVE Final    Comment: (NOTE) If result is NEGATIVE SARS-CoV-2 target nucleic acids are NOT DETECTED. The SARS-CoV-2 RNA is generally detectable in upper and lower  respiratory specimens during the acute phase of infection. The lowest  concentration of SARS-CoV-2 viral copies this assay can detect is 250  copies / mL. A negative result does not preclude SARS-CoV-2 infection  and should not be used as the sole basis for  treatment or other  patient management decisions.  A negative result may occur with  improper specimen collection / handling, submission of specimen other  than nasopharyngeal swab, presence of viral mutation(s) within the  areas targeted by this assay, and inadequate number of viral copies  (<250 copies / mL). A negative result must be combined with clinical  observations, patient history, and epidemiological information. If result is POSITIVE SARS-CoV-2 target nucleic acids are DETECTED. The SARS-CoV-2 RNA is generally detectable in upper and lower  respiratory specimens dur ing the acute phase of infection.  Positive  results are indicative of active infection with SARS-CoV-2.  Clinical  correlation with patient history and other diagnostic information is  necessary to determine patient infection status.  Positive results do  not rule out bacterial infection or co-infection with other viruses. If result is PRESUMPTIVE POSTIVE SARS-CoV-2 nucleic acids MAY BE PRESENT.   A presumptive positive result was obtained on  the submitted specimen  and confirmed on repeat testing.  While 2019 novel coronavirus  (SARS-CoV-2) nucleic acids may be present in the submitted sample  additional confirmatory testing may be necessary for epidemiological  and / or clinical management purposes  to differentiate between  SARS-CoV-2 and other Sarbecovirus currently known to infect humans.  If clinically indicated additional testing with an alternate test  methodology [phone]) is advised. The SARS-CoV-2 RNA is generally  detectable in upper and lower respiratory sp ecimens during the acute  phase of infection. The expected result is Negative. Fact Sheet for Patients:  StrictlyIdeas.no Fact Sheet for Healthcare Providers: BankingDealers.co.za This test is not yet approved or cleared by the [place] FDA and has been authorized for detection and/or  diagnosis of SARS-CoV-2 by FDA under an Emergency Use Authorization (EUA).  This EUA will remain in effect (meaning this test can be used) for the duration of the COVID-19 declaration under Section 564(b)(1) of the Act, 21 U.S.C. section 360bbb-3(b)(1), unless the authorization is terminated or revoked sooner. Performed at [facility], [place] [address], [place], [place] [address]   MRSA PCR Screening     Status: None   Collection Time: 09/24/18 11:31 PM   Specimen: Nasal Mucosa; Nasopharyngeal  Result Value Ref Range Status   MRSA by PCR NEGATIVE NEGATIVE Final    Comment:        The GeneXpert MRSA Assay (FDA approved for NASAL specimens only), is one component of a comprehensive MRSA colonization surveillance program. It is not intended to diagnose MRSA infection nor to guide or monitor treatment for MRSA infections. Performed at [facility], [place] [address], [place], [place] [address]       Radiology Studies: Dg Chest 2 View  Result Date: 09/24/2018 CLINICAL DATA:  Chest pain, headache, hypertension EXAM: CHEST - 2 VIEW COMPARISON:  09/23/2018 FINDINGS: Unchanged examination. No acute abnormality of the lungs. Cardiomegaly status post median sternotomy with surgical aortic annulus and extensive multi component stent endograft repair of the aorta. IMPRESSION: Unchanged examination. No acute abnormality of the lungs. Cardiomegaly status post median sternotomy with surgical aortic annulus and extensive multi component stent endograft repair of the aorta. Electronically Signed   By: Eddie Candle M.D.   On: 09/24/2018 13:10   Dg Chest 2 View  Result Date: 09/23/2018 CLINICAL DATA:  Chest pain EXAM: CHEST - 2 VIEW COMPARISON:  09/14/2018, 03/25/2018, CT 12/02/2017, radiograph 01/31/2017 FINDINGS: Post sternotomy changes with aortic valve and root prostheses. Similar appearance of thoracic aortic stent graft. Stable pleural and parenchymal scarring at the left base. No acute  airspace disease or effusion. No pneumothorax. Stable mild cardiomegaly. IMPRESSION: No active cardiopulmonary disease. Stable postsurgical changes of the mediastinum with pleural and parenchymal scarring at the left base Electronically Signed   By: Donavan Foil M.D.   On: 09/23/2018 21:10   Ct Angio Chest/abd/pel For Dissection W And/or W/wo  Result Date: 09/24/2018 CLINICAL DATA:  History of aortic dissection with repair EXAM: CT ANGIOGRAPHY CHEST, ABDOMEN AND PELVIS TECHNIQUE: Multidetector CT imaging through the chest, abdomen and pelvis was performed using the standard protocol during bolus administration of intravenous contrast. Multiplanar reconstructed images and MIPs were obtained and reviewed to evaluate the vascular anatomy. CONTRAST:  79mL OMNIPAQUE 350 COMPARISON:  12/02/2017 FINDINGS: CTA CHEST FINDINGS Cardiovascular: Aortic valve replacement is again identified and stable in appearance. Aortic root is stable in appearance measuring approximately 4.2 cm in greatest dimension. The ascending aortic graft is again identified  with a stable appearance with Elephant graft giving rise to the brachiocephalic vessels. No focal narrowing is noted. Just beyond the Elephant graft, there is evidence of stent graft placement extending through the aortic arch and into the descending thoracic aorta. No endoleak is identified. The pulmonary artery appears within normal limits although opacification is limited with regards to pulmonary embolism. No pericardial effusion is seen. No other focal abnormality is noted. Mediastinum/Nodes: Thoracic inlet shows no acute abnormality. No sizable hilar or mediastinal adenopathy is noted. The esophagus as visualized is within normal limits. Lungs/Pleura: Lungs are well aerated bilaterally. Right lung shows no focal infiltrate or sizable effusion. Mild scarring remains at the left lung base similar to that seen on prior exam. No parenchymal nodules are seen. No pneumothorax is  noted. Musculoskeletal: Degenerative changes of the thoracic spine are noted. Postsurgical changes consistent with median sternotomy are seen. No acute rib abnormality is noted. Review of the MIP images confirms the above findings. CTA ABDOMEN AND PELVIS FINDINGS VASCULAR Aorta: The known thoracic aortic stent graft extends into the proximal abdominal aorta. A an implanted graft to the celiac axis is identified with patency of the branches of the celiac axis. Additionally graft to the superior mesenteric artery is noted as well. Tortuous graft to the left renal artery is noted. A more inline graft to the right renal artery is noted as well. These are patent. 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 No enhancement is identified. No recurrent dissection is seen. Calcifications of the more distal aorta are noted. Celiac: Widely patent from graft arising from the proximal aorta. SMA: Widely patent from graft arising from the proximal aorta. Renals: Single renal arteries are identified following grafting to the proximal aorta. IMA: Patent without evidence of aneurysm, dissection, vasculitis or significant stenosis. Iliacs: Atherosclerotic calcifications of the iliac vessels are noted. No focal stenosis is seen. 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 Veins: No venous abnormality is noted. Review of the MIP images confirms the above findings. NON-VASCULAR Hepatobiliary: No focal liver abnormality is seen. No gallstones, gallbladder wall thickening, or biliary dilatation. Pancreas: Unremarkable. No pancreatic ductal dilatation or surrounding inflammatory changes. Spleen: Normal in size without focal abnormality. Adrenals/Urinary Tract: Adrenal glands are within normal limits. Kidneys demonstrate a normal enhancement pattern bilaterally. No obstructive changes are seen. Mild scarring  is noted in the left kidney stable from the previous exam. The bladder is well distended. Stomach/Bowel: The appendix is within normal limits. No obstructive or inflammatory changes of large or small bowel is noted. Stomach is within normal limits. Lymphatic: No lymphatic abnormality is noted. Reproductive: Prostate is unremarkable. Other: No abdominal wall hernia or abnormality. No abdominopelvic ascites. Musculoskeletal: No acute or significant osseous findings. Review of the MIP images confirms the above findings. IMPRESSION: Stable postoperative appearance when compared with the prior exam. Extensive reconstruction of the ascending aorta is seen with aortic valve and elephant graft supplying the brachiocephalic vessels. Thoracic stent graft is noted in place without evidence of endoleak. Extensive postoperative change in the proximal abdominal aorta with patent C2 all of the visceral grafts identified. 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 No acute dissection is seen. No areas of focal hemorrhage are identified. No acute abnormality within the chest or abdomen is noted. Electronically Signed  By: Inez Catalina M.D.   On: 09/24/2018 17:40        Scheduled Meds:  amLODipine  10 mg Oral Daily   aspirin EC  81 mg Oral Daily   carvedilol  25 mg Oral BID WC   cloNIDine  0.3 mg Transdermal Weekly   ezetimibe  10 mg Oral Daily   hydrALAZINE  100 mg Oral Q8H   nitroGLYCERIN  0.5 inch Topical Q6H   pantoprazole  40 mg Oral Daily   rosuvastatin  40 mg Oral Daily   sodium chloride flush  3 mL Intravenous Once   sodium chloride flush  3 mL Intravenous Q12H   Continuous Infusions:  sodium chloride     sodium chloride     sodium chloride     heparin 1,050 Units/hr (09/25/18 0416)    Assessment & Plan:    1.  Chest pain/non-STEMI: Patient currently chest pain-free but complaining of  headache.  Will have morphine IV every 6 PRN available for today.  Discussed with cardiology Dr. Moshe Cipro who stated okay to reduce nitroglycerin patch frequency, continue aspirin, statins and Coreg.  Control blood pressure as discussed below.  CT chest negative for dissection on admission.  Urine drug screen sent, results pending patient admits to marijuana use but denies any cocaine use.  Recent stress test unremarkable but given extensive cardiac history and high risk for ACS, cardiology planning on cath in a.m.  Continue heparin for now.  Repeat echo this admission shows mildly reduced LV systolic function, with an ejection fraction of 45-50%. The cavity size was normal. There is severe concentric left ventricular hypertrophy likely related to uncontrolled blood pressure.  2.  Hypertensive urgency: Likely contributing to problem #1.  As discussed above, continue Nitropatch, clonidine, Coreg, hydralazine and Norvasc at this time.Labetelol prn.   3.  Headache: Secondary to uncontrolled blood pressure versus nitrates.  Patient describes it as pulsatile, occipitally going to the neck without photophobia.  He relates it to [employer].  Will reduce frequency.  Morphine PRN available.  Patient states acetaminophen or Fioricet not helping.  Tramadol listed as allergy.  4.  History of aortic dissection: Status post endoluminal stent grafting 1 year ago.  Control blood pressure.  CT chest on admission negative for any acute issues.    5. Chronic abdominal pain: Patient advised the risks of opiate dependence and the limited ability of inpatient providers to prescribe opiates upon this discharge.  He is encouraged to set up follow-up visit with pain clinic/PCP to enter a pain contract if long-term opiate prescriptions desired.  6. CKD: creatinine stable at 2.  Patient advised of the risks of repeated contrasted study/cath dye with CKD.  Monitor creatinine post-cath  7.  Polysubstance abuse: Patient states he only  smokes 3 cigarettes/day.  He also has history of alcohol use.  No signs of withdrawal currently.  Urine drug screen positive for marijuana previously.  Repeat screen pending.  DVT prophylaxis: On heparin drip Code Status: Full code Family / Patient Communication: Discussed with patient, all questions answered to her satisfaction. Disposition Plan: Home when medically cleared.     LOS: 1 day    Time spent: 35 minutes    Guilford Shi, MD [facility] Pager [phone]  If 7PM-7AM, please contact night-coverage [email or web address] Password [facility] 09/25/2018, 4:22 PM

## 2018-09-26 ENCOUNTER — Telehealth (HOSPITAL_COMMUNITY): Payer: Self-pay

## 2018-09-26 ENCOUNTER — Encounter (HOSPITAL_COMMUNITY)
Admission: EM | Disposition: A | Discharge status: Home / Self Care | Payer: Self-pay | Source: Home / Self Care | Attending: Internal Medicine

## 2018-09-26 ENCOUNTER — Other Ambulatory Visit (HOSPITAL_COMMUNITY): Payer: Self-pay

## 2018-09-26 ENCOUNTER — Ambulatory Visit (INDEPENDENT_AMBULATORY_CARE_PROVIDER_SITE_OTHER): Payer: Medicare HMO | Admitting: Primary Care

## 2018-09-26 DIAGNOSIS — I2510 Atherosclerotic heart disease of native coronary artery without angina pectoris: Secondary | ICD-10-CM

## 2018-09-26 DIAGNOSIS — I5022 Chronic systolic (congestive) heart failure: Secondary | ICD-10-CM

## 2018-09-26 HISTORY — PX: CORONARY ANGIOGRAPHY: CATH118303

## 2018-09-26 LAB — CBC
HCT: 39 % (ref 39.0–52.0)
Hemoglobin: 13.1 g/dL (ref 13.0–17.0)
MCH: 31.3 pg (ref 26.0–34.0)
MCHC: 33.6 g/dL (ref 30.0–36.0)
MCV: 93.1 fL (ref 80.0–100.0)
Platelets: 131 10*3/uL — ABNORMAL LOW (ref 150–400)
RBC: 4.19 MIL/uL — ABNORMAL LOW (ref 4.22–5.81)
RDW: 13.5 % (ref 11.5–15.5)
WBC: 5.2 10*3/uL (ref 4.0–10.5)
nRBC: 0 % (ref 0.0–0.2)

## 2018-09-26 LAB — BASIC METABOLIC PANEL
Anion gap: 8 (ref 5–15)
BUN: 19 mg/dL (ref 6–20)
CO2: 23 mmol/L (ref 22–32)
Calcium: 8.6 mg/dL — ABNORMAL LOW (ref 8.9–10.3)
Chloride: 106 mmol/L (ref 98–111)
Creatinine, Ser: 2.13 mg/dL — ABNORMAL HIGH (ref 0.61–1.24)
GFR calc Af Amer: 42 mL/min — ABNORMAL LOW (ref >60)
GFR calc non Af Amer: 36 mL/min — ABNORMAL LOW (ref >60)
Glucose, Bld: 97 mg/dL (ref 70–99)
Potassium: 3.4 mmol/L — ABNORMAL LOW (ref 3.5–5.1)
Sodium: 137 mmol/L (ref 135–145)

## 2018-09-26 LAB — HEPARIN LEVEL (UNFRACTIONATED): Heparin Unfractionated: 0.41 IU/mL (ref 0.30–0.70)

## 2018-09-26 SURGERY — CORONARY ANGIOGRAPHY (CATH LAB)
Anesthesia: LOCAL

## 2018-09-26 MED ORDER — FENTANYL CITRATE (PF) 100 MCG/2ML IJ SOLN
INTRAMUSCULAR | Status: AC
  Filled 2018-09-26: qty 2

## 2018-09-26 MED ORDER — HEPARIN (PORCINE) IN NACL 1000-0.9 UT/500ML-% IV SOLN
INTRAVENOUS | Status: AC
  Filled 2018-09-26: qty 1000

## 2018-09-26 MED ORDER — MIDAZOLAM HCL 2 MG/2ML IJ SOLN
INTRAMUSCULAR | Status: AC
  Filled 2018-09-26: qty 2

## 2018-09-26 MED ORDER — MIDAZOLAM HCL 2 MG/2ML IJ SOLN
INTRAMUSCULAR | Status: DC | PRN
  Administered 2018-09-26: 1 mg via INTRAVENOUS

## 2018-09-26 MED ORDER — SODIUM CHLORIDE 0.9 % IV SOLN: 250.0000 mL | INTRAVENOUS | Status: DC | PRN

## 2018-09-26 MED ORDER — CLOPIDOGREL BISULFATE 75 MG PO TABS
75.0000 mg | ORAL_TABLET | Freq: Every day | ORAL | Status: DC
  Administered 2018-09-26 – 2018-09-28 (×3): 75 mg via ORAL
  Filled 2018-09-26 (×2): qty 1

## 2018-09-26 MED ORDER — HEPARIN SODIUM (PORCINE) 1000 UNIT/ML IJ SOLN
INTRAMUSCULAR | Status: AC
  Filled 2018-09-26: qty 1

## 2018-09-26 MED ORDER — HEPARIN SODIUM (PORCINE) 1000 UNIT/ML IJ SOLN
INTRAMUSCULAR | Status: DC | PRN
  Administered 2018-09-26: 3500 [IU] via INTRAVENOUS

## 2018-09-26 MED ORDER — FENTANYL CITRATE (PF) 100 MCG/2ML IJ SOLN
INTRAMUSCULAR | Status: DC | PRN
  Administered 2018-09-26: 50 ug via INTRAVENOUS

## 2018-09-26 MED ORDER — SODIUM CHLORIDE 0.9% FLUSH: 3.0000 mL | INTRAVENOUS | Status: DC | PRN

## 2018-09-26 MED ORDER — HEPARIN (PORCINE) IN NACL 1000-0.9 UT/500ML-% IV SOLN
INTRAVENOUS | Status: DC | PRN
  Administered 2018-09-26 (×2): 500 mL

## 2018-09-26 MED ORDER — LIDOCAINE HCL (PF) 1 % IJ SOLN
INTRAMUSCULAR | Status: DC | PRN
  Administered 2018-09-26: 2 mL

## 2018-09-26 MED ORDER — LIDOCAINE HCL (PF) 1 % IJ SOLN
INTRAMUSCULAR | Status: AC
  Filled 2018-09-26: qty 30

## 2018-09-26 MED ORDER — IOHEXOL 350 MG/ML SOLN
INTRAVENOUS | Status: DC | PRN
  Administered 2018-09-26: 90 mL via INTRACARDIAC

## 2018-09-26 MED ORDER — HYDRALAZINE HCL 20 MG/ML IJ SOLN: 10.0000 mg | INTRAMUSCULAR | Status: AC | PRN

## 2018-09-26 MED ORDER — HEPARIN SODIUM (PORCINE) 5000 UNIT/ML IJ SOLN
5000.0000 [IU] | Freq: Three times a day (TID) | INTRAMUSCULAR | Status: DC
  Administered 2018-09-27 – 2018-09-28 (×3): 5000 [IU] via SUBCUTANEOUS
  Filled 2018-09-26 (×3): qty 1

## 2018-09-26 MED ORDER — VERAPAMIL HCL 2.5 MG/ML IV SOLN
INTRAVENOUS | Status: DC | PRN
  Administered 2018-09-26: 10 mL via INTRA_ARTERIAL

## 2018-09-26 MED ORDER — SODIUM CHLORIDE 0.9 % IV SOLN: INTRAVENOUS | Status: AC

## 2018-09-26 MED ORDER — SODIUM CHLORIDE 0.9% FLUSH
3.0000 mL | Freq: Two times a day (BID) | INTRAVENOUS | Status: DC
  Administered 2018-09-27 (×2): 3 mL via INTRAVENOUS

## 2018-09-26 MED ORDER — VERAPAMIL HCL 2.5 MG/ML IV SOLN
INTRAVENOUS | Status: AC
  Filled 2018-09-26: qty 2

## 2018-09-26 SURGICAL SUPPLY — 14 items
BAND CMPR LRG ZPHR (HEMOSTASIS) ×1
BAND ZEPHYR COMPRESS 30 LONG (HEMOSTASIS) ×1 IMPLANT
CATH IMPULSE 5F ANG/FL3.5 (CATHETERS) ×1 IMPLANT
CATH LAUNCHER 5F EBU4.0 (CATHETERS) ×1 IMPLANT
COVER DOME SNAP 22 D (MISCELLANEOUS) ×1 IMPLANT
GLIDESHEATH SLEND SS 6F .021 (SHEATH) ×1 IMPLANT
GUIDEWIRE INQWIRE 1.5J.035X260 (WIRE) IMPLANT
INQWIRE 1.5J .035X260CM (WIRE) ×2
KIT HEART LEFT (KITS) ×2 IMPLANT
PACK CARDIAC CATHETERIZATION (CUSTOM PROCEDURE TRAY) ×2 IMPLANT
SHEATH PROBE COVER 6X72 (BAG) ×1 IMPLANT
TRANSDUCER W/STOPCOCK (MISCELLANEOUS) ×2 IMPLANT
TUBING CIL FLEX 10 FLL-RA (TUBING) ×2 IMPLANT
WIRE HI TORQ VERSACORE-J 145CM (WIRE) ×1 IMPLANT

## 2018-09-26 NOTE — Telephone Encounter (Signed)
LVM to relay result notes, has appt tomorrow.  Referral to pulmonology placed.

## 2018-09-26 NOTE — Progress Notes (Signed)
amb ref °

## 2018-09-26 NOTE — Interval H&P Note (Signed)
History and Physical Interval Note:  09/26/2018 11:15 AM  [name]  has presented today for cardiac catheterization, with the diagnosis of NSTEMI.  The various methods of treatment have been discussed with the patient and family. After consideration of risks, benefits and other options for treatment, the patient has consented to  Procedure(s): LEFT HEART CATH AND CORONARY ANGIOGRAPHY (N/A) as a surgical intervention.  The patient's history has been reviewed, patient examined, no change in status, stable for surgery.  I have reviewed the patient's chart and labs.  Questions were answered to the patient's satisfaction.    Cath Lab Visit (complete for each Cath Lab visit)  Clinical Evaluation Leading to the Procedure:   ACS: Yes.    Non-ACS:  N/A  [name]

## 2018-09-26 NOTE — Progress Notes (Signed)
[place] for heparin Indication: chest pain/ACS  Allergies  Allergen Reactions  . Imdur [Isosorbide Nitrate] Other (See Comments)    Headaches   . Tramadol Other (See Comments)    Headaches     Patient Measurements: weight 69 kg, height 71 inches Height: 5\' 11"  (180.3 cm) Weight: 153 lb (69.4 kg) IBW/kg (Calculated) : 75.3 Heparin Dosing Weight: 69 kg  Vital Signs: Temp: 98 F (36.7 C) (07/13 0300) Temp Source: Oral (07/13 0300) BP: 165/94 (07/13 0737) Pulse Rate: 70 (07/13 0737)  Labs: Recent Labs    09/24/18 1341 09/24/18 1430  09/25/18 0309 09/25/18 1129 09/25/18 1712 09/26/18 0249  HGB 15.1  --   --  14.4  --   --  13.1  HCT 46.9  --   --  42.5  --   --  39.0  PLT 123*  --   --  143*  --   --  131*  HEPARINUNFRC  --   --    < > 0.21* 0.33 0.27* 0.41  CREATININE 2.05*  --   --  2.11*  --   --  2.13*  TROPONINIHS 848* 878.0*  --  2,314*  --   --   --    < > = values in this interval not displayed.    Estimated Creatinine Clearance: 42.5 mL/min (A) (by C-G formula based on SCr of 2.13 mg/dL (H)).  Assessment: Patient is a 48 y.o M with hx history type B aortic dissection status post endoluminal stent grafting and ramus branch intervention a year ago, left AMA from [facility] on 7/11 at noon while he was being evaluated for CP and elevated troponin. He presented to [facility] two hours later with c/o CP.    Heparin level at goal at 0.41 on 1200 units/hr this morning. Hgb trending down from 14.4 to 13.1, pltc low but stable at 131. No bleeding issues noted.   Goal of Therapy:  Heparin level 0.3-0.7 units/ml Monitor platelets by anticoagulation protocol: Yes   Plan:  - Continue heparin drip at 1200 units/hr - Check heparin level daily with CBC - Monitor for s/s bleeding  Thanks for allowing pharmacy to be a part of this patient's care. Erin Hearing PharmD., BCPS Clinical Pharmacist 09/26/2018 8:31 AM  Please check [email or web address] for  unit-specific pharmacist phone numbers

## 2018-09-26 NOTE — Progress Notes (Signed)
PROGRESS NOTE    [name]  OZH:[id number]  DOB: Jun 10, 1970  DOA: 09/24/2018 PCP: Clent Demark, PA-C  Brief Narrative:  48 y.o. male with medical history significant for aortic dissection status post endoluminal stent grafting about a year ago, CAD s/p PTCA in 2019, NICM EF 62-95%/MWUXL 1 diastolic dysfunction, CHF, CKD, tobacco abuse, presents to the ED complaining of chest pain and headache.  Patient initially presented to [facility] early hours this morning, and signed AMA, later came back to [facility] hospital ER having similar complains.  Patient reported left-sided chest pain radiating to his left arm, goes up to his left side of the neck and left side of the head, with some numbness in his arm. Patient reports symptoms have been going on for few weeks but preferred not to come to the hospital due to [place] pandemic. Patient states he ran out of clonidine patch 2 weeks ago but claims he is compliant with his other medications. Work-up in the ED at [facility] showed uncontrolled blood pressure, uptrending troponins but normal EKG.  Cardiology was contacted and patient transferred to [facility] for further inpatient work-up. He had -ve stress test in May 2020 and denies recent exercise tolerance.  Patient has old surgical scar on left side of his abdomen and states he has chronic pain and that he was following a pain clinic in the past.  He however is unable to give me any details about the name of the pain clinic or doctor.  He claims he last saw pain clinic/MD 1 year back.  Subjective:   Patient sleeping comfortably. He is NPO for cardiac cath today. BP improved to systolic 244W  Objective: Vitals:   09/26/18 0300 09/26/18 0539 09/26/18 0737 09/26/18 0900  BP: (!) 164/85 (!) 173/93 (!) 165/94 (!) 162/91  Pulse: 60  70 (!) 115  Resp: 18  13 (!) 32  Temp: 98 F (36.7 C)     TempSrc: Oral     SpO2: 100%  97% 100%  Weight:      Height:        Intake/Output Summary (Last  24 hours) at 09/26/2018 1031 Last data filed at 09/26/2018 0900 Gross per 24 hour  Intake 2694.01 ml  Output 1425 ml  Net 1269.01 ml   Filed Weights   09/24/18 2130  Weight: 69.4 kg    Physical Examination:  General exam: Appears calm and comfortable with no respiratory distress Respiratory system: Clear to auscultation. Respiratory effort normal. Cardiovascular system: S1 & S2 heard, RRR. No JVD, murmurs, rubs, gallops or clicks. No pedal edema. Gastrointestinal system: Abdomen is nondistended, soft and nontender.  Old surgical scar along sternal area and abdominal scar/left para umbilical region extending to midline. No organomegaly or masses felt. Normal bowel sounds heard. Central nervous system: Alert and oriented. No focal neurological deficits. Extremities: Symmetric 5 x 5 power. Skin: No rashes, lesions or ulcers Psychiatry: Mood & affect irritable/at times agitated    Data Reviewed: I have personally reviewed following labs and imaging studies  CBC: Recent Labs  Lab 09/23/18 2050 09/24/18 1341 09/25/18 0309 09/26/18 0249  WBC 6.8 7.0 6.3 5.2  HGB 14.5 15.1 14.4 13.1  HCT 43.5 46.9 42.5 39.0  MCV 94.4 96.9 92.8 93.1  PLT 132* 123* 143* 102*   Basic Metabolic Panel: Recent Labs  Lab 09/23/18 2050 09/24/18 1341 09/25/18 0309 09/26/18 0249  NA 141 138 136 137  K 3.6 3.7 3.5 3.4*  CL 109 104 104  106  CO2 22 21* 24 23  GLUCOSE 89 79 108* 97  BUN 16 21* 21* 19  CREATININE 2.17* 2.05* 2.11* 2.13*  CALCIUM 9.2 9.1 8.9 8.6*   GFR: Estimated Creatinine Clearance: 42.5 mL/min (A) (by C-G formula based on SCr of 2.13 mg/dL (H)). Liver Function Tests: No results for input(s): AST, ALT, ALKPHOS, BILITOT, PROT, ALBUMIN in the last 168 hours. No results for input(s): LIPASE, AMYLASE in the last 168 hours. No results for input(s): AMMONIA in the last 168 hours. Coagulation Profile: No results for input(s): INR, PROTIME in the last 168 hours. Cardiac Enzymes: No  results for input(s): CKTOTAL, CKMB, CKMBINDEX, TROPONINI in the last 168 hours. BNP (last 3 results) No results for input(s): PROBNP in the last 8760 hours. HbA1C: No results for input(s): HGBA1C in the last 72 hours. CBG: No results for input(s): GLUCAP in the last 168 hours. Lipid Profile: No results for input(s): CHOL, HDL, LDLCALC, TRIG, CHOLHDL, LDLDIRECT in the last 72 hours. Thyroid Function Tests: No results for input(s): TSH, T4TOTAL, FREET4, T3FREE, THYROIDAB in the last 72 hours. Anemia Panel: No results for input(s): VITAMINB12, FOLATE, FERRITIN, TIBC, IRON, RETICCTPCT in the last 72 hours. Sepsis Labs: No results for input(s): PROCALCITON, LATICACIDVEN in the last 168 hours.  Recent Results (from the past 240 hour(s))  SARS Coronavirus 2 (CEPHEID - Performed in [place] hospital lab), Hosp Order     Status: None   Collection Time: 09/24/18  3:02 AM   Specimen: Nasopharyngeal Swab  Result Value Ref Range Status   SARS Coronavirus 2 NEGATIVE NEGATIVE Final    Comment: (NOTE) If result is NEGATIVE SARS-CoV-2 target nucleic acids are NOT DETECTED. The SARS-CoV-2 RNA is generally detectable in upper and lower  respiratory specimens during the acute phase of infection. The lowest  concentration of SARS-CoV-2 viral copies this assay can detect is 250  copies / mL. A negative result does not preclude SARS-CoV-2 infection  and should not be used as the sole basis for treatment or other  patient management decisions.  A negative result may occur with  improper specimen collection / handling, submission of specimen other  than nasopharyngeal swab, presence of viral mutation(s) within the  areas targeted by this assay, and inadequate number of viral copies  (<250 copies / mL). A negative result must be combined with clinical  observations, patient history, and epidemiological information. If result is POSITIVE SARS-CoV-2 target nucleic acids are DETECTED. The SARS-CoV-2 RNA  is generally detectable in upper and lower  respiratory specimens dur ing the acute phase of infection.  Positive  results are indicative of active infection with SARS-CoV-2.  Clinical  correlation with patient history and other diagnostic information is  necessary to determine patient infection status.  Positive results do  not rule out bacterial infection or co-infection with other viruses. If result is PRESUMPTIVE POSTIVE SARS-CoV-2 nucleic acids MAY BE PRESENT.   A presumptive positive result was obtained on the submitted specimen  and confirmed on repeat testing.  While 2019 novel coronavirus  (SARS-CoV-2) nucleic acids may be present in the submitted sample  additional confirmatory testing may be necessary for epidemiological  and / or clinical management purposes  to differentiate between  SARS-CoV-2 and other Sarbecovirus currently known to infect humans.  If clinically indicated additional testing with an alternate test  methodology [id number]) is advised. The SARS-CoV-2 RNA is generally  detectable in upper and lower respiratory sp ecimens during the acute  phase of infection. The expected  result is Negative. Fact Sheet for Patients:  StrictlyIdeas.no Fact Sheet for Healthcare Providers: BankingDealers.co.za This test is not yet approved or cleared by the [place] FDA and has been authorized for detection and/or diagnosis of SARS-CoV-2 by FDA under an Emergency Use Authorization (EUA).  This EUA will remain in effect (meaning this test can be used) for the duration of the COVID-19 declaration under Section 564(b)(1) of the Act, 21 U.S.C. section 360bbb-3(b)(1), unless the authorization is terminated or revoked sooner. Performed at [facility], [place] [address] [place], [place] [address]   MRSA PCR Screening     Status: None   Collection Time: 09/24/18 11:31 PM   Specimen: Nasal Mucosa; Nasopharyngeal  Result Value  Ref Range Status   MRSA by PCR NEGATIVE NEGATIVE Final    Comment:        The GeneXpert MRSA Assay (FDA approved for NASAL specimens only), is one component of a comprehensive MRSA colonization surveillance program. It is not intended to diagnose MRSA infection nor to guide or monitor treatment for MRSA infections. Performed at [facility], [place] [address], [place], [place] [address]       Radiology Studies: Dg Chest 2 View  Result Date: 09/24/2018 CLINICAL DATA:  Chest pain, headache, hypertension EXAM: CHEST - 2 VIEW COMPARISON:  09/23/2018 FINDINGS: Unchanged examination. No acute abnormality of the lungs. Cardiomegaly status post median sternotomy with surgical aortic annulus and extensive multi component stent endograft repair of the aorta. IMPRESSION: Unchanged examination. No acute abnormality of the lungs. Cardiomegaly status post median sternotomy with surgical aortic annulus and extensive multi component stent endograft repair of the aorta. Electronically Signed   By: Eddie Candle M.D.   On: 09/24/2018 13:10   Ct Angio Chest/abd/pel For Dissection W And/or W/wo  Result Date: 09/24/2018 CLINICAL DATA:  History of aortic dissection with repair EXAM: CT ANGIOGRAPHY CHEST, ABDOMEN AND PELVIS TECHNIQUE: Multidetector CT imaging through the chest, abdomen and pelvis was performed using the standard protocol during bolus administration of intravenous contrast. Multiplanar reconstructed images and MIPs were obtained and reviewed to evaluate the vascular anatomy. CONTRAST:  26mL OMNIPAQUE 350 COMPARISON:  12/02/2017 FINDINGS: CTA CHEST FINDINGS Cardiovascular: Aortic valve replacement is again identified and stable in appearance. Aortic root is stable in appearance measuring approximately 4.2 cm in greatest dimension. The ascending aortic graft is again identified with a stable appearance with Elephant graft giving rise to the brachiocephalic vessels. No focal narrowing is noted. Just  beyond the Elephant graft, there is evidence of stent graft placement extending through the aortic arch and into the descending thoracic aorta. No endoleak is identified. The pulmonary artery appears within normal limits although opacification is limited with regards to pulmonary embolism. No pericardial effusion is seen. No other focal abnormality is noted. Mediastinum/Nodes: Thoracic inlet shows no acute abnormality. No sizable hilar or mediastinal adenopathy is noted. The esophagus as visualized is within normal limits. Lungs/Pleura: Lungs are well aerated bilaterally. Right lung shows no focal infiltrate or sizable effusion. Mild scarring remains at the left lung base similar to that seen on prior exam. No parenchymal nodules are seen. No pneumothorax is noted. Musculoskeletal: Degenerative changes of the thoracic spine are noted. Postsurgical changes consistent with median sternotomy are seen. No acute rib abnormality is noted. Review of the MIP images confirms the above findings. CTA ABDOMEN AND PELVIS FINDINGS VASCULAR Aorta: The known thoracic aortic stent graft extends into the proximal abdominal aorta. A an implanted graft to the celiac  axis is identified with patency of the branches of the celiac axis. Additionally graft to the superior mesenteric artery is noted as well. Tortuous graft to the left renal artery is noted. A more inline graft to the right renal artery is noted as well. These are patent. 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 No enhancement is identified. No recurrent dissection is seen. Calcifications of the more distal aorta are noted. Celiac: Widely patent from graft arising from the proximal aorta. SMA: Widely patent from graft arising from the proximal aorta. Renals: Single renal arteries are identified following grafting to the proximal aorta. IMA: Patent without evidence of  aneurysm, dissection, vasculitis or significant stenosis. Iliacs: Atherosclerotic calcifications of the iliac vessels are noted. No focal stenosis is seen. 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 Veins: No venous abnormality is noted. Review of the MIP images confirms the above findings. NON-VASCULAR Hepatobiliary: No focal liver abnormality is seen. No gallstones, gallbladder wall thickening, or biliary dilatation. Pancreas: Unremarkable. No pancreatic ductal dilatation or surrounding inflammatory changes. Spleen: Normal in size without focal abnormality. Adrenals/Urinary Tract: Adrenal glands are within normal limits. Kidneys demonstrate a normal enhancement pattern bilaterally. No obstructive changes are seen. Mild scarring is noted in the left kidney stable from the previous exam. The bladder is well distended. Stomach/Bowel: The appendix is within normal limits. No obstructive or inflammatory changes of large or small bowel is noted. Stomach is within normal limits. Lymphatic: No lymphatic abnormality is noted. Reproductive: Prostate is unremarkable. Other: No abdominal wall hernia or abnormality. No abdominopelvic ascites. Musculoskeletal: No acute or significant osseous findings. Review of the MIP images confirms the above findings. IMPRESSION: Stable postoperative appearance when compared with the prior exam. Extensive reconstruction of the ascending aorta is seen with aortic valve and elephant graft supplying the brachiocephalic vessels. Thoracic stent graft is noted in place without evidence of endoleak. Extensive postoperative change in the proximal abdominal aorta with patent C2 all of the visceral grafts identified. 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 No acute dissection is seen. No areas of focal hemorrhage are identified. No acute abnormality within the chest or  abdomen is noted. Electronically Signed   By: Inez Catalina M.D.   On: 09/24/2018 17:40        Scheduled Meds:  amLODipine  10 mg Oral Daily   aspirin EC  81 mg Oral Daily   carvedilol  25 mg Oral BID WC   [START ON 10/01/2018] cloNIDine  0.3 mg Transdermal Q Sat   ezetimibe  10 mg Oral Daily   hydrALAZINE  100 mg Oral Q8H   nitroGLYCERIN  0.5 inch Topical Q8H   pantoprazole  40 mg Oral Daily   rosuvastatin  40 mg Oral Daily   sodium chloride flush  3 mL Intravenous Once   sodium chloride flush  3 mL Intravenous Q12H   Continuous Infusions:  sodium chloride     sodium chloride     sodium chloride 1 mL/kg/hr (09/26/18 0900)   heparin 1,200 Units/hr (09/26/18 0900)    Assessment & Plan:    1.  Chest pain/non-STEMI: Patient currently chest pain-free and states morphine helping  headaches.  Continue Coreg, nitroglycerin patch,aspirin, heparin, statins. Can likely transition NTP to Imdur after cardiac cath.  Control blood pressure as discussed below.  CT chest negative for dissection on admission.  Urine drug screen sent, results still pending. Patient admits to marijuana use but denies any cocaine use.  Recent stress test unremarkable but given extensive cardiac history and high risk for ACS, cardiology planning on cath today.  Continue heparin for now per cards.  Repeat echo this admission shows mildly reduced LV systolic function, with an ejection fraction of 45-50%. The cavity size was normal. There is severe concentric left ventricular hypertrophy likely related to uncontrolled blood pressure.  2.  Hypertensive urgency: Likely contributing to problem #1.  As discussed above, continue Nitropatch, clonidine, Coreg, hydralazine and Norvasc at this time.Labetelol prn.   3.  Headache: Secondary to uncontrolled blood pressure versus nitrates.  Patient describes it as pulsatile, occipitally going to the neck without photophobia.  He relates it to [employer].  Will d/c post  cardiac cath and transition to Imdur.  Morphine PRN available.  Patient states acetaminophen or Fioricet not helping.  Tramadol listed as allergy. He does have h/o chronic pain and  has been argumentative during stay here with staff regarding pain meds.  4.  History of aortic dissection: Status post endoluminal stent grafting 1 year ago.  Control blood pressure.  CT chest on admission negative for any acute issues.    5. Chronic abdominal pain: Patient advised the risks of opiate dependence and the limited ability of inpatient providers to prescribe opiates upon this discharge.  He is encouraged to set up follow-up visit with pain clinic/PCP to enter a pain contract if long-term opiate prescriptions desired.  6. CKD: creatinine stable at 2.  Patient advised of the risks of repeated contrasted study/cath dye with CKD.  Monitor creatinine post-cath  7.  Polysubstance abuse: Patient states he only smokes 3 cigarettes/day.  He also has history of alcohol use.  No signs of withdrawal currently.  Urine drug screen positive for marijuana previously.  Repeat screen results pending.  DVT prophylaxis: On heparin drip Code Status: Full code Family / Patient Communication: Discussed with patient, all questions answered to her satisfaction. Disposition Plan: Home when medically cleared.     LOS: 2 days    Time spent: 35 minutes    Guilford Shi, MD [facility] Pager [phone]  If 7PM-7AM, please contact night-coverage [email or web address] Password [facility] 09/26/2018, 10:31 AM

## 2018-09-26 NOTE — Brief Op Note (Addendum)
BRIEF CARDIAC CATHETERIZATION NOTE  09/26/2018  12:18 PM  PATIENT:  [name]  48 y.o. male  PRE-OPERATIVE DIAGNOSIS:  NSTEMI  POST-OPERATIVE DIAGNOSIS:  NSTEMI  PROCEDURE:  Procedure(s): LEFT HEART CATH AND CORONARY ANGIOGRAPHY (N/A)  SURGEON:  Surgeon(s) and Role:    * Jaelee Laughter, MD - Primary  FINDINGS: 1. Large, tortuous coronary arteries, mild to moderate disease involving the main branches.  Appearance is similar to prior catheterizations in 11/2017. 2. Patent ostial ramus intermedius stent.  RECOMMENDATIONS: 1. Medical therapy and secondary prevention. 2. Restart clopidogrel 75 mg daily in addition to aspirin 81 mg daily. 3. Post-cath hydration with repeat BMP tomorrow to monitor renal function following contrast exposure today.  Nelva Bush, MD [facility] Pager: [phone]

## 2018-09-27 ENCOUNTER — Encounter (HOSPITAL_COMMUNITY): Payer: Medicare HMO | Admitting: Cardiology

## 2018-09-27 ENCOUNTER — Encounter (HOSPITAL_COMMUNITY): Payer: Self-pay | Admitting: Internal Medicine

## 2018-09-27 DIAGNOSIS — E785 Hyperlipidemia, unspecified: Secondary | ICD-10-CM

## 2018-09-27 DIAGNOSIS — E7800 Pure hypercholesterolemia, unspecified: Secondary | ICD-10-CM

## 2018-09-27 DIAGNOSIS — N183 Chronic kidney disease, stage 3 (moderate): Secondary | ICD-10-CM

## 2018-09-27 DIAGNOSIS — N170 Acute kidney failure with tubular necrosis: Secondary | ICD-10-CM

## 2018-09-27 LAB — BASIC METABOLIC PANEL
Anion gap: 10 (ref 5–15)
BUN: 19 mg/dL (ref 6–20)
CO2: 22 mmol/L (ref 22–32)
Calcium: 8.9 mg/dL (ref 8.9–10.3)
Chloride: 106 mmol/L (ref 98–111)
Creatinine, Ser: 2.3 mg/dL — ABNORMAL HIGH (ref 0.61–1.24)
GFR calc Af Amer: 38 mL/min — ABNORMAL LOW (ref >60)
GFR calc non Af Amer: 33 mL/min — ABNORMAL LOW (ref >60)
Glucose, Bld: 80 mg/dL (ref 70–99)
Potassium: 3.8 mmol/L (ref 3.5–5.1)
Sodium: 138 mmol/L (ref 135–145)

## 2018-09-27 LAB — CBC
HCT: 39.5 % (ref 39.0–52.0)
Hemoglobin: 13.1 g/dL (ref 13.0–17.0)
MCH: 31.6 pg (ref 26.0–34.0)
MCHC: 33.2 g/dL (ref 30.0–36.0)
MCV: 95.2 fL (ref 80.0–100.0)
Platelets: 132 10*3/uL — ABNORMAL LOW (ref 150–400)
RBC: 4.15 MIL/uL — ABNORMAL LOW (ref 4.22–5.81)
RDW: 13.6 % (ref 11.5–15.5)
WBC: 4.6 10*3/uL (ref 4.0–10.5)
nRBC: 0 % (ref 0.0–0.2)

## 2018-09-27 MED ORDER — ISOSORBIDE MONONITRATE ER 30 MG PO TB24
30.0000 mg | ORAL_TABLET | Freq: Every day | ORAL | Status: DC
  Administered 2018-09-27 – 2018-09-28 (×2): 30 mg via ORAL
  Filled 2018-09-27 (×2): qty 1

## 2018-09-27 NOTE — Plan of Care (Signed)
  Problem: Education: Goal: Knowledge of General Education information will improve Description Including pain rating scale, medication(s)/side effects and non-pharmacologic comfort measures Outcome: Progressing   

## 2018-09-27 NOTE — Progress Notes (Signed)
PROGRESS NOTE    [name]  FKC:[id number] DOB: [birth year] DOA: 09/24/2018 PCP: Clent Demark, PA-C    Brief Narrative:  48 year old male who presented with chest pain.  He does have significant past medical history for aortic dissection status post endoluminal stent grafting 2019, coronary artery disease, nonischemic cardiomyopathy, heart failure, chronic kidney disease and tobacco abuse.  He reported chest pain and headache.  Left-sided chest pain radiating to his left arm, associated with paresthesias.  Ongoing symptoms for a few weeks.  On his initial physical examination blood pressure 185/102, heart rate 60, respiratory rate 21, oxygen saturation 98%, his lungs are clear to auscultation bilaterally, heart S1-S2 present and rhythmic, his abdomen was soft nontender, no lower extremity edema.  Sodium 138, potassium 3.7, chloride 104, bicarb 21, glucose 79, BUN 21, creatinine 2.0, high sensitive troponin VIII 148, white count 7.0, hemoglobin 15.1, hematocrit 46.9, platelets 123.  SARS COVID-19 was negative. CT chest and chest film with no acute changes.  EKG 80 bpm, left axis deviation, normal intervals, sinus rhythm with left atrial enlargement, ST segment depression with T wave inversions in lead I, aVL, V4 through V6, positive LVH, (chronic changes).  Patient was admitted to the hospital with the working diagnosis of non-ST elevation myocardial infarction.  Assessment & Plan:   Active Problems:   NSTEMI (non-ST elevated myocardial infarction) ([place])   1. NSTEMI. Continue medical management, cardiac catheretization with unchanged anatomy. Patient with no chest pain. Continue asa and clopidogrel.   2. Uncontrolled HTN. Continue uncontrolled HRM will continue amlodipine, carvedilol, hydralazine and isosorbide, follow with nephrology recommendations. Clonidine patch.   3. Dyslipidemia. Continue ezetimibe and rosuvastatin.   DVT prophylaxis: enoxaparin   Code Status: full Family  Communication: no family at the bedside  Disposition Plan/ discharge barriers: pending clinical improvement.   Body mass index is 21.34 kg/m. Malnutrition Type:      Malnutrition Characteristics:      Nutrition Interventions:     RN Pressure Injury Documentation:     Consultants:   Cardiology   Nephrology  Procedures:     Antimicrobials:       Subjective: Patient is feeling better, blood pressure continue to be elevated, no further chest pain, no dyspnea, no nausea or vomiting.   Objective: Vitals:   09/26/18 1629 09/26/18 1939 09/27/18 0000 09/27/18 0403  BP: (!) 144/87 (!) 151/83 134/73 (!) 141/72  Pulse: 71 66 74 (!) 55  Resp: (!) 21 14 17 16   Temp: 98.2 F (36.8 C) 98.2 F (36.8 C) 97.9 F (36.6 C) 98.5 F (36.9 C)  TempSrc: Oral Oral Oral Oral  SpO2: 98% 98% 96% 96%  Weight:      Height:        Intake/Output Summary (Last 24 hours) at 09/27/2018 0808 Last data filed at 09/27/2018 7494 Gross per 24 hour  Intake 2466.02 ml  Output 2825 ml  Net -358.98 ml   Filed Weights   09/24/18 2130  Weight: 69.4 kg    Examination:   General: Not in pain or dyspnea Neurology: Awake and alert, non focal  E ENT: mild pallor, no icterus, oral mucosa moist Cardiovascular: No JVD. S1-S2 present, rhythmic, no gallops, rubs, or murmurs. No lower extremity edema. Pulmonary: positive breath sounds bilaterally, adequate air movement, no wheezing, rhonchi or rales. Gastrointestinal. Abdomen with, no organomegaly, non tender, no rebound or guarding Skin. No rashes Musculoskeletal: no joint deformities     Data Reviewed: I have personally reviewed following labs and imaging  studies  CBC: Recent Labs  Lab 09/23/18 2050 09/24/18 1341 09/25/18 0309 09/26/18 0249  WBC 6.8 7.0 6.3 5.2  HGB 14.5 15.1 14.4 13.1  HCT 43.5 46.9 42.5 39.0  MCV 94.4 96.9 92.8 93.1  PLT 132* 123* 143* 962*   Basic Metabolic Panel: Recent Labs  Lab 09/23/18 2050 09/24/18  1341 09/25/18 0309 09/26/18 0249  NA 141 138 136 137  K 3.6 3.7 3.5 3.4*  CL 109 104 104 106  CO2 22 21* 24 23  GLUCOSE 89 79 108* 97  BUN 16 21* 21* 19  CREATININE 2.17* 2.05* 2.11* 2.13*  CALCIUM 9.2 9.1 8.9 8.6*   GFR: Estimated Creatinine Clearance: 42.5 mL/min (A) (by C-G formula based on SCr of 2.13 mg/dL (H)). Liver Function Tests: No results for input(s): AST, ALT, ALKPHOS, BILITOT, PROT, ALBUMIN in the last 168 hours. No results for input(s): LIPASE, AMYLASE in the last 168 hours. No results for input(s): AMMONIA in the last 168 hours. Coagulation Profile: No results for input(s): INR, PROTIME in the last 168 hours. Cardiac Enzymes: No results for input(s): CKTOTAL, CKMB, CKMBINDEX, TROPONINI in the last 168 hours. BNP (last 3 results) No results for input(s): PROBNP in the last 8760 hours. HbA1C: No results for input(s): HGBA1C in the last 72 hours. CBG: No results for input(s): GLUCAP in the last 168 hours. Lipid Profile: No results for input(s): CHOL, HDL, LDLCALC, TRIG, CHOLHDL, LDLDIRECT in the last 72 hours. Thyroid Function Tests: No results for input(s): TSH, T4TOTAL, FREET4, T3FREE, THYROIDAB in the last 72 hours. Anemia Panel: No results for input(s): VITAMINB12, FOLATE, FERRITIN, TIBC, IRON, RETICCTPCT in the last 72 hours.    Radiology Studies: I have reviewed all of the imaging during this hospital visit personally     Scheduled Meds: . amLODipine  10 mg Oral Daily  . aspirin EC  81 mg Oral Daily  . carvedilol  25 mg Oral BID WC  . [START ON 10/01/2018] cloNIDine  0.3 mg Transdermal Q Sat  . clopidogrel  75 mg Oral Q breakfast  . ezetimibe  10 mg Oral Daily  . heparin  5,000 Units Subcutaneous Q8H  . hydrALAZINE  100 mg Oral Q8H  . nitroGLYCERIN  0.5 inch Topical Q8H  . pantoprazole  40 mg Oral Daily  . rosuvastatin  40 mg Oral Daily  . sodium chloride flush  3 mL Intravenous Once  . sodium chloride flush  3 mL Intravenous Q12H    Continuous Infusions: . sodium chloride    . sodium chloride       LOS: 3 days        Mauricio Gerome Apley, MD

## 2018-09-27 NOTE — Progress Notes (Addendum)
Progress Note  Patient Name: [name] Date of Encounter: 09/27/2018  Primary Cardiologist: [name], MD   Subjective   Feels fine this morning. Denies CP. No dyspnea. Radical cath site is stable. He is eager to go home.   Inpatient Medications    Scheduled Meds: . amLODipine  10 mg Oral Daily  . aspirin EC  81 mg Oral Daily  . carvedilol  25 mg Oral BID WC  . [START ON 10/01/2018] cloNIDine  0.3 mg Transdermal Q Sat  . clopidogrel  75 mg Oral Q breakfast  . ezetimibe  10 mg Oral Daily  . heparin  5,000 Units Subcutaneous Q8H  . hydrALAZINE  100 mg Oral Q8H  . nitroGLYCERIN  0.5 inch Topical Q8H  . pantoprazole  40 mg Oral Daily  . rosuvastatin  40 mg Oral Daily  . sodium chloride flush  3 mL Intravenous Once  . sodium chloride flush  3 mL Intravenous Q12H   Continuous Infusions: . sodium chloride    . sodium chloride     PRN Meds: sodium chloride, acetaminophen **OR** acetaminophen, HYDROcodone-acetaminophen, labetalol, morphine injection, ondansetron **OR** ondansetron (ZOFRAN) IV, polyethylene glycol, sodium chloride flush   Vital Signs    Vitals:   09/26/18 1629 09/26/18 1939 09/27/18 0000 09/27/18 0403  BP: (!) 144/87 (!) 151/83 134/73 (!) 141/72  Pulse: 71 66 74 (!) 55  Resp: (!) 21 14 17 16   Temp: 98.2 F (36.8 C) 98.2 F (36.8 C) 97.9 F (36.6 C) 98.5 F (36.9 C)  TempSrc: Oral Oral Oral Oral  SpO2: 98% 98% 96% 96%  Weight:      Height:        Intake/Output Summary (Last 24 hours) at 09/27/2018 1008 Last data filed at 09/27/2018 0900 Gross per 24 hour  Intake 1977.62 ml  Output 2475 ml  Net -497.38 ml   Last 3 Weights 09/24/2018 09/24/2018 09/24/2018  Weight (lbs) 153 lb 153 lb 154 lb  Weight (kg) 69.4 kg 69.4 kg 69.854 kg      Telemetry    NSR 80s  - Personally Reviewed  ECG    N/A not performed today - Personally Reviewed  Physical Exam   GEN: No acute distress.   Neck: No JVD Cardiac: RRR, 3/6 systolic murmur heard  throughout the precordium and radiates to the posterior thorax.  Respiratory: Clear to auscultation bilaterally. GI: Soft, nontender, non-distended  MS: No edema; No deformity. Neuro:  Nonfocal  Psych: Normal affect   Labs    High Sensitivity Troponin:   Recent Labs  Lab 09/24/18 0138 09/24/18 0404 09/24/18 1341 09/24/18 1430 09/25/18 0309  TROPONINIHS 81* 194* 848* 878.0* 2,314*      Cardiac EnzymesNo results for input(s): TROPONINI in the last 168 hours. No results for input(s): TROPIPOC in the last 168 hours.   Chemistry Recent Labs  Lab 09/25/18 0309 09/26/18 0249 09/27/18 0412  NA 136 137 138  K 3.5 3.4* 3.8  CL 104 106 106  CO2 24 23 22   GLUCOSE 108* 97 80  BUN 21* 19 19  CREATININE 2.11* 2.13* 2.30*  CALCIUM 8.9 8.6* 8.9  GFRNONAA 36* 36* 33*  GFRAA 42* 42* 38*  ANIONGAP 8 8 10      Hematology Recent Labs  Lab 09/25/18 0309 09/26/18 0249 09/27/18 0412  WBC 6.3 5.2 4.6  RBC 4.58 4.19* 4.15*  HGB 14.4 13.1 13.1  HCT 42.5 39.0 39.5  MCV 92.8 93.1 95.2  MCH 31.4 31.3 31.6  MCHC 33.9  33.6 33.2  RDW 13.7 13.5 13.6  PLT 143* 131* 132*    BNPNo results for input(s): BNP, PROBNP in the last 168 hours.   DDimer No results for input(s): DDIMER in the last 168 hours.   Radiology    No results found.  Cardiac Studies   [facility] 09/26/18 Conclusion    Ost 1st Diag lesion is 60% stenosed.   Conclusions: 1. Large, tortuous coronary arteries, mild to moderate disease involving the main branches. Appearance is similar to prior catheterizations in 11/2017. 2. Patent ostial ramus intermedius stent.  Recommendations: 1. Medical therapy and secondary prevention. 2. Restart clopidogrel 75 mg daily in addition to aspirin 81 mg daily. 3. Post-cath hydration with repeat BMP tomorrow to monitor renal function following contrast exposure today.    Coronary Diagrams  Diagnostic Dominance: Right  Intervention - none. Diagnostic only.  Medical management      Patient Profile     [name] is a 48 year old male with a complex medical history that includes aortic dissection status post Bentall procedure at [facility] with bioprosthetic aortic valve and endoluminal stent grafting, CAD s/p RI  PTCA in 0932, combined systolic and diastolic HF, NICM EF 67-12%, Stage III CKD and tobacco abuse, admitted for CP w/ elevated troponins.   Assessment & Plan   1. Chest Pain/ CAD w/ Elevated Troponin: Hs Trop rose to 2,314. Cath yesterday showed stable CAD, similar to prior cath in 11/2017 w/ patent ostial ramus intermedius stent, large tortuous coronaries with mild to moderate disease involving the main branches, 60% stenosis in the ostial 1st diag. No indication for PCI. Medical therapy recommended. Recommend uninterrupted dual antiplatelet therapy with Aspirin 81mg  daily and Clopidogrel 75mg  daily for a minimum of 12 months (ACS-Class I recommendation). Continue  blocker, statin and Zetia.   2. NICM/ Combined Systolic and Diastolic HF: Echo 4/58/09 shows slightly improved LVEF, now at 45-50%. Also noted is severely thicken LV w/ bright speckled pattern however per chart review, he had a PYP scan 03/2018 that showed no evidence for TTR amyloidosis. Per notes from the AHF clinic, his cardiomyopathy is most likely due to heavy ETOH ingestion and uncontrolled HTN. He is euvolemic on exam. No dyspnea.   3. Acute on Chronic CKD: Stage III CKD at baseline. Admit SCr was 2.05. gradual rise throughout admit. Got contrast w/ chest CT and contrast w/ cath.  SCr now 2.30 post cardiac cath 7/13. He recieved IVF hydration. Follow BMP. Avoid nephrotoxic agents.   4. H/o Aortic Dissection: status post Bentall procedure at [facility] with bioprosthetic aortic valve and endoluminal stent grafting. Chest CT in the ED showed no dissection or other acute abnormalities.   5. HTN: severely elevated this morning at in the 983J systolic and low 825K diastolic. He is asymptomatic and he is on  multiple medications. He is maxed out on Coreg, hydralazine, amlodipine and clonidine. No ACE/ARB due to CKD. Renal function slightly worse. I have discussed with Dr. Radford Pax and she has recommended nephrology consultation for further assistance. Will place consult request.      For questions or updates, please contact [place] Please consult [email or web address] for contact info under        Signed, Lyda Jester, PA-C  09/27/2018, 10:08 AM

## 2018-09-27 NOTE — Care Management Important Message (Addendum)
Important Message  Patient Details  Name: [name] MRN: [id number] Date of Birth: 01-15-1971   Medicare Important Message Given:  Yes     Memory Argue 09/27/2018, 12:23 PM    Patient have signed  IM

## 2018-09-27 NOTE — Plan of Care (Signed)

## 2018-09-27 NOTE — Consult Note (Signed)
[facility]  INPATIENT CONSULTATION  Reason for Consultation: HTN and AKI Requesting Provider: Lyda Jester PA-C  HPI: [name] is an 48 y.o. male with h/o aortic dissection s/p endoluminal stent graft, CAD s/p PCI 2019, HFrEF 35-40% + DD, CKD with baseline Cr ~2, tobacco abuse who is seen for evaluation and management of AKI on CKD and HTN.   Admitted 7/11 with chest pain, underwent LHC yesterday which showed no acute issues, similar to 11/2017 LHC and medical therapy was recommended.  It seems that the only outstanding issue is his HTN -- has chronic difficult to control HTN.  During this admission  BPs have ranged mainly 140-160s, a few outliers to 130s and 190s.    He tells me he was out of clonidine patch for about 2 weeks just prior to admission due to issue obtaining refill from MD due to doctor leaving practice.  He is very adherent to medication regimen and checks home BP frequently on validated cuff - typically 120-130.  Quit EtOH several years ago.  +MJ, no cocaine.  PMH: Past Medical History:  Diagnosis Date  . Adenomatous colon polyp 08/2015  . Anxiety   . Aortic disease ([place])   . Aortic dissection (H[facility])    a. admx 04/2014 >> L renal infarct; a/c renal failure >> b.  s/p Bioprosthetic Bentall and total arch replacement and staged endovascular repair of descending aortic aneurysm ([facility] - Dr. [name])  . CAD (coronary artery disease)    a. LHC 4/16:  oD1 60%  . Cardiomyopathy ([place])    a. non-ischemic - probably related to untreated HTN and ETOH abuse - Echo 3/13 with EF 35-40% >> b. Echo 4/16: Severe LVH, EF 55-60%, moderate AI, moderate MR, mild LAE, trivial effusion, known type B dissection with communication between true and false lumens with suprasternal images suggesting dissection plane may propagate to at least left subclavian takeoff, root above aortic valve okay    . Chronic abdominal pain   . Chronic combined systolic and diastolic congestive heart  failure ([place])    a. 05/2011: Adm with pulm edema/HTN urgency, EF 35-40% with diffuse hypokinesis and moderate to severe mitral regurgitation. Cardiomyopathy likely due to uncontrolled HTN and ETOH abuse - cath deferred due to renal insufficiency (felt due to uncontrolled HTN). bJodie Echevaria MV 06/2011: EF 37% and no ischemia or infarction. c. EF 45-50% by echo 01/2012.  [name] Chronic sinusitis   . CKD (chronic kidney disease)    a. 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  peak creatinine 3.46 during admx for aortic dissection 2/16.  sees Dr [name]  . DDD (degenerative disc disease), lumbar   . Depression   . Descending thoracic aortic aneurysm ([place])   . Dissecting aneurysm of thoracic aorta ([place])   . ETOH abuse    a. Reported to have quit 05/2011.  . Frequent headaches   . GERD (gastroesophageal reflux disease)   . Headache(784.0)    "q other day" (08/08/2013)  . Heart murmur   . Hemorrhoid thrombosis   . History of echocardiogram    Echo 1/17:  Severe LVH, EF 55-60%, no RWMA, Gr 2 DD, AVR ok, mild to mod MR, mild LAE, mild reduced RVSF, mod RAE  . History of medication noncompliance   . HYPERLIPIDEMIA   . Hypertension    a. Hx of HTN urgency secondary to noncompliance. b. urinary metanephrine and catecholeamine levels normal 2013.  c. Renal art [name] 1/16:  No evidence of renal artery stenosis noted  bilaterally.  . INGUINAL HERNIA   . Pneumonia ~ 2013  . Renal insufficiency   . Serrated adenoma of colon 08/2015  . Stroke ([place])   . Tobacco abuse   . Valvular heart disease    a. Echo 05/2011: moderate to severe eccentric MR and mild to moderate AI with prolapsing left coronary cusp. b. Echo 01/2012: mild-mod AI, mild dilitation of aortic root, mild MR.;  c. Echo 1/16: Severe LVH consistent with hypertrophic cardio myopathy, EF 50%, no RWMA, mod AI, mild MR, mild RAE, dilated Ao root (40 mm);    PSH: Past Surgical History:  Procedure Laterality Date  . ANKLE SURGERY Bilateral    Fractures bilaterally  .  AORTIC VALVE SURGERY  09/2014  . CORONARY ANGIOGRAPHY N/A 12/06/2017   Procedure: CORONARY ANGIOGRAPHY;  Surgeon: Belva Crome, MD;  Location: [place];  Service: Cardiovascular;  Laterality: N/A;  . CORONARY ANGIOGRAPHY N/A 09/26/2018   Procedure: CORONARY ANGIOGRAPHY;  Surgeon: Nelva Bush, MD;  Location: [place] [facility];  Service: Cardiovascular;  Laterality: N/A;  . CORONARY STENT INTERVENTION N/A 12/10/2017   Procedure: CORONARY STENT INTERVENTION;  Surgeon: [name] Sine, MD;  Location: [place] [facility];  Service: Cardiovascular;  Laterality: N/A;  . FOOT FRACTURE SURGERY Bilateral 2004-2010   "got pins in both of them"  . HEMORRHOID SURGERY N/A 06/15/2015   Procedure: HEMORRHOIDECTOMY;  Surgeon: Stark Klein, MD;  Location: [place];  Service: General;  Laterality: N/A;  . INGUINAL HERNIA REPAIR Right ~ 1996  . LEFT HEART CATHETERIZATION WITH CORONARY ANGIOGRAM N/A 06/21/2014   Procedure: LEFT HEART CATHETERIZATION WITH CORONARY ANGIOGRAM;  Surgeon: Larey Dresser, MD;  Location: [facility] CATH LAB;  Service: Cardiovascular;  Laterality: N/A;  . LOOP RECORDER INSERTION N/A 06/19/2016   Procedure: Loop Recorder Insertion;  Surgeon: Deboraha Sprang, MD;  Location: [place] [facility];  Service: Cardiovascular;  Laterality: N/A;  . TEE WITHOUT CARDIOVERSION N/A 03/12/2016   Procedure: TRANSESOPHAGEAL ECHOCARDIOGRAM (TEE);  Surgeon: Sanda Klein, MD;  Location: [facility] ENDOSCOPY;  Service: Cardiovascular;  Laterality: N/A;    Past Medical History:  Diagnosis Date  . Adenomatous colon polyp 08/2015  . Anxiety   . Aortic disease ([place])   . Aortic dissection (H[facility])    a. admx 04/2014 >> L renal infarct; a/c renal failure >> b.  s/p Bioprosthetic Bentall and total arch replacement and staged endovascular repair of descending aortic aneurysm ([facility] - Dr. [name])  . CAD (coronary artery disease)    a. LHC 4/16:  oD1 60%  . Cardiomyopathy ([place])    a. non-ischemic - probably related to untreated  HTN and ETOH abuse - Echo 3/13 with EF 35-40% >> b. Echo 4/16: Severe LVH, EF 55-60%, moderate AI, moderate MR, mild LAE, trivial effusion, known type B dissection with communication between true and false lumens with suprasternal images suggesting dissection plane may propagate to at least left subclavian takeoff, root above aortic valve okay    . Chronic abdominal pain   . Chronic combined systolic and diastolic congestive heart failure ([place])    a. 05/2011: Adm with pulm edema/HTN urgency, EF 35-40% with diffuse hypokinesis and moderate to severe mitral regurgitation. Cardiomyopathy likely due to uncontrolled HTN and ETOH abuse - cath deferred due to renal insufficiency (felt due to uncontrolled HTN). bJodie Echevaria MV 06/2011: EF 37% and no ischemia or infarction. c. EF 45-50% by echo 01/2012.  [name] Chronic sinusitis   . CKD (chronic kidney disease)  a. 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  peak creatinine 3.46 during admx for aortic dissection 2/16.  sees Dr [name]  . DDD (degenerative disc disease), lumbar   . Depression   . Descending thoracic aortic aneurysm ([place])   . Dissecting aneurysm of thoracic aorta ([place])   . ETOH abuse    a. Reported to have quit 05/2011.  . Frequent headaches   . GERD (gastroesophageal reflux disease)   . Headache(784.0)    "q other day" (08/08/2013)  . Heart murmur   . Hemorrhoid thrombosis   . History of echocardiogram    Echo 1/17:  Severe LVH, EF 55-60%, no RWMA, Gr 2 DD, AVR ok, mild to mod MR, mild LAE, mild reduced RVSF, mod RAE  . History of medication noncompliance   . HYPERLIPIDEMIA   . Hypertension    a. Hx of HTN urgency secondary to noncompliance. b. urinary metanephrine and catecholeamine levels normal 2013.  c. Renal art [name] 1/16:  No evidence of renal artery stenosis noted bilaterally.  . INGUINAL HERNIA   . Pneumonia ~ 2013  . Renal insufficiency   . Serrated adenoma of colon 08/2015  . Stroke ([place])   . Tobacco abuse   . Valvular heart disease    a.  Echo 05/2011: moderate to severe eccentric MR and mild to moderate AI with prolapsing left coronary cusp. b. Echo 01/2012: mild-mod AI, mild dilitation of aortic root, mild MR.;  c. Echo 1/16: Severe LVH consistent with hypertrophic cardio myopathy, EF 50%, no RWMA, mod AI, mild MR, mild RAE, dilated Ao root (40 mm);     Medications:  I have reviewed the patient's current medications.  Medications Prior to Admission  Medication Sig Dispense Refill  . hydrALAZINE (APRESOLINE) 100 MG tablet Take 100 mg by mouth 2 (two) times daily.    [name] acetaminophen (TYLENOL) 325 MG tablet Take 650 mg by mouth every 6 (six) hours as needed (for pain or headaches).     [name] amLODipine (NORVASC) 10 MG tablet Take 1 tablet (10 mg total) by mouth daily. 30 tablet 6  . aspirin EC 81 MG EC tablet Take 1 tablet (81 mg total) by mouth daily. 90 tablet 3  . carvedilol (COREG) 25 MG tablet Take 1 tablet (25 mg total) by mouth 2 (two) times daily with a meal. 180 tablet 3  . cloNIDine (CATAPRES - DOSED IN MG/24 HR) 0.3 mg/24hr patch Place 1 patch (0.3 mg total) onto the skin once a week. (Patient taking differently: Place 0.3 mg onto the skin every Saturday. ) 4 patch 6  . clopidogrel (PLAVIX) 75 MG tablet Take 1 tablet (75 mg total) by mouth daily with breakfast. (Patient not taking: Reported on 09/23/2018) 90 tablet 3  . ezetimibe (ZETIA) 10 MG tablet Take 1 tablet (10 mg total) by mouth daily. 90 tablet 3  . furosemide (LASIX) 40 MG tablet Take one tablet daily as needed for shortness of breath or weight gain of more than 3 lbs in one day. (Patient taking differently: Take 40 mg by mouth daily as needed (shortness of breath or weight gain of more than 3 lbs in one day). ) 30 tablet 1  . pantoprazole (PROTONIX) 40 MG tablet Take 1 tablet (40 mg total) by mouth daily. 30 tablet 11  . rosuvastatin (CRESTOR) 40 MG tablet Take 1 tablet (40 mg total) by mouth daily. 90 tablet 3    ALLERGIES:   Allergies  Allergen Reactions  .  Imdur [Isosorbide Nitrate] Other (See  Comments)    Headaches   . Tramadol Other (See Comments)    Headaches     FAM HX: Family History  Problem Relation Age of Onset  . Hypertension Mother   . Hypertension Other   . Colon cancer Paternal Uncle   . Stroke Maternal Aunt   . Heart attack Brother   . Diabetes Maternal Aunt   . Lung cancer Maternal Uncle   . Other Sister        "breathing machine at night"  . Headache Sister   . Thyroid disease Sister   . Throat cancer Neg Hx   . Pancreatic cancer Neg Hx   . Esophageal cancer Neg Hx   . Kidney disease Neg Hx   . Liver disease Neg Hx     Social History:   reports that he has been smoking cigarettes. He has a 5.75 pack-year smoking history. He has never used smokeless tobacco. He reports current alcohol use of about 1.0 standard drinks of alcohol per week. He reports current drug use. Drug: Marijuana.  ROS: 12 system ROS neg except per HPI above  Blood pressure (!) 192/95, pulse (!) 55, temperature 97.8 F (36.6 C), temperature source Oral, resp. rate 17, height 5\' 11"  (1.803 m), weight 69.4 kg, SpO2 96 %. PHYSICAL EXAM: Gen: well appearing  Eyes: acincteric ENT: MMM  Neck: supple no JVD CV: IV/VI SEM LUSB, borderline brady Abd: soft, nontender Lungs: clear to bases GU: no foley Extr:  No edema Neuro: nonfocal   Results for orders placed or performed during the hospital encounter of 09/24/18 (from the past 48 hour(s))  Heparin level (unfractionated)     Status: Abnormal   Collection Time: 09/25/18  5:12 PM  Result Value Ref Range   Heparin Unfractionated 0.27 (L) 0.30 - 0.70 IU/mL    Comment: (NOTE) If heparin results are below expected values, and patient dosage has  been confirmed, suggest follow up testing of antithrombin III levels. Performed at [facility], [place] [address], [place], [place] [address]   Heparin level (unfractionated)     Status: None   Collection Time: 09/26/18  2:49 AM  Result Value  Ref Range   Heparin Unfractionated 0.41 0.30 - 0.70 IU/mL    Comment: (NOTE) If heparin results are below expected values, and patient dosage has  been confirmed, suggest follow up testing of antithrombin III levels. Performed at [facility], [place] [address], [place], [place] [address]   CBC     Status: Abnormal   Collection Time: 09/26/18  2:49 AM  Result Value Ref Range   WBC 5.2 4.0 - 10.5 K/uL   RBC 4.19 (L) 4.22 - 5.81 MIL/uL   Hemoglobin 13.1 13.0 - 17.0 g/dL   HCT 39.0 39.0 - 52.0 %   MCV 93.1 80.0 - 100.0 fL   MCH 31.3 26.0 - 34.0 pg   MCHC 33.6 30.0 - 36.0 g/dL   RDW 13.5 11.5 - 15.5 %   Platelets 131 (L) 150 - 400 K/uL   nRBC 0.0 0.0 - 0.2 %    Comment: Performed at [facility], [place] [address], [place], [place] [address]  Basic metabolic panel     Status: Abnormal   Collection Time: 09/26/18  2:49 AM  Result Value Ref Range   Sodium 137 135 - 145 mmol/L   Potassium 3.4 (L) 3.5 - 5.1 mmol/L   Chloride 106 98 - 111 mmol/L   CO2 23 22 - 32  mmol/L   Glucose, Bld 97 70 - 99 mg/dL   BUN 19 6 - 20 mg/dL   Creatinine, Ser 2.13 (H) 0.61 - 1.24 mg/dL   Calcium 8.6 (L) 8.9 - 10.3 mg/dL   GFR calc non Af Amer 36 (L) >60 mL/min   GFR calc Af Amer 42 (L) >60 mL/min   Anion gap 8 5 - 15    Comment: Performed at [facility] [address] [place], [place] [address]  CBC     Status: Abnormal   Collection Time: 09/27/18  4:12 AM  Result Value Ref Range   WBC 4.6 4.0 - 10.5 K/uL   RBC 4.15 (L) 4.22 - 5.81 MIL/uL   Hemoglobin 13.1 13.0 - 17.0 g/dL   HCT 39.5 39.0 - 52.0 %   MCV 95.2 80.0 - 100.0 fL   MCH 31.6 26.0 - 34.0 pg   MCHC 33.2 30.0 - 36.0 g/dL   RDW 13.6 11.5 - 15.5 %   Platelets 132 (L) 150 - 400 K/uL    Comment: REPEATED TO VERIFY   nRBC 0.0 0.0 - 0.2 %    Comment: Performed at [facility], [place] [address], [place], [place] [address]  Basic metabolic panel     Status: Abnormal   Collection Time: 09/27/18  4:12 AM  Result Value Ref  Range   Sodium 138 135 - 145 mmol/L   Potassium 3.8 3.5 - 5.1 mmol/L   Chloride 106 98 - 111 mmol/L   CO2 22 22 - 32 mmol/L   Glucose, Bld 80 70 - 99 mg/dL   BUN 19 6 - 20 mg/dL   Creatinine, Ser 2.30 (H) 0.61 - 1.24 mg/dL   Calcium 8.9 8.9 - 10.3 mg/dL   GFR calc non Af Amer 33 (L) >60 mL/min   GFR calc Af Amer 38 (L) >60 mL/min   Anion gap 10 5 - 15    Comment: Performed at [facility] [address] [place], [place] [address]    No results found.  Assessment/Plan **HTN: per review of historic notes seems BP has been challenging to control.  Home meds on admission hydralazine 100 BID, amlodipine 10 daily, coreg 25 BID, catapres TTS 3, lasix PRN. Current meds include hydralazine 100 TID, catapres 3, coreg 25 BID, amlodipine 10 daily.  BPs have ranged mainly 140-160s during admission, a few outliers to 130s and 190s.  We had a long discussion and he's open to trying imdur 30 mg daily - has reported intolerance of HA though we discussed that is typically a limited side effect.  I think part of the issue here is that he rec'd saline load around cath and other issues with technique of checking BP may be at play.  He's very attentive to his BP and I think it's reasonable to discharge him with home monitoring with close f/u in clinic.    **AKI on CKD: baseline cr recently ~2 and in the setting of LHC yesterday was 2.3 today.  He did receive pericath hydration.  I expect this is mild contrast nephropathy.  Expect it likely will be reversible but time will tell.  Discussed factors to delay CKD progression with him today and he's aware.   **HFrEF 35-40%: follows with cardiology, medically optimized; euvolemic.    Will ensure has f/u in nephrology clinic soon to measure and discuss BP management, f/u AKI on CKD. Page with questions. Will plan to follow here.   Justin Mend  09/27/2018, 2:12 PM

## 2018-09-28 ENCOUNTER — Other Ambulatory Visit: Payer: Self-pay | Admitting: Medical

## 2018-09-28 DIAGNOSIS — I43 Cardiomyopathy in diseases classified elsewhere: Secondary | ICD-10-CM

## 2018-09-28 DIAGNOSIS — I119 Hypertensive heart disease without heart failure: Secondary | ICD-10-CM

## 2018-09-28 LAB — BASIC METABOLIC PANEL
Anion gap: 9 (ref 5–15)
BUN: 21 mg/dL — ABNORMAL HIGH (ref 6–20)
CO2: 23 mmol/L (ref 22–32)
Calcium: 9.1 mg/dL (ref 8.9–10.3)
Chloride: 105 mmol/L (ref 98–111)
Creatinine, Ser: 2.22 mg/dL — ABNORMAL HIGH (ref 0.61–1.24)
GFR calc Af Amer: 40 mL/min — ABNORMAL LOW (ref >60)
GFR calc non Af Amer: 34 mL/min — ABNORMAL LOW (ref >60)
Glucose, Bld: 92 mg/dL (ref 70–99)
Potassium: 3.7 mmol/L (ref 3.5–5.1)
Sodium: 137 mmol/L (ref 135–145)

## 2018-09-28 MED ORDER — CARVEDILOL 25 MG PO TABS: 25.0000 mg | ORAL_TABLET | Freq: Two times a day (BID) | ORAL | 0 refills | Status: DC

## 2018-09-28 MED ORDER — ISOSORBIDE MONONITRATE ER 30 MG PO TB24: 30.0000 mg | ORAL_TABLET | Freq: Every day | ORAL | 0 refills | Status: DC

## 2018-09-28 MED ORDER — HYDRALAZINE HCL 100 MG PO TABS: 100.0000 mg | ORAL_TABLET | Freq: Three times a day (TID) | ORAL | 0 refills | Status: DC

## 2018-09-28 MED ORDER — TRAMADOL HCL 50 MG PO TABS: 50.0000 mg | ORAL_TABLET | Freq: Four times a day (QID) | ORAL | Status: DC | PRN

## 2018-09-28 MED ORDER — CLONIDINE 0.3 MG/24HR TD PTWK: 0.3000 mg | MEDICATED_PATCH | TRANSDERMAL | 6 refills | Status: DC

## 2018-09-28 MED ORDER — AMLODIPINE BESYLATE 10 MG PO TABS: 10.0000 mg | ORAL_TABLET | Freq: Every day | ORAL | 0 refills | Status: DC

## 2018-09-28 NOTE — Discharge Summary (Signed)
Physician Discharge Summary  [name]:[id number] DOB: 01-06-1971 DOA: 09/24/2018  PCP: Clent Demark, PA-C  Admit date: 09/24/2018 Discharge date: 09/28/2018  Admitted From: Home  Disposition:  Home  Recommendations for Outpatient Follow-up and new medication changes:  1. Follow up with Domenica Fail in 7 days.  2. Patient has been placed on isosorbide and hydralazine increased to TID, continue home blood pressure monitoring.  3. Follow up with nephrology as scheduled.   Home Health: no   Equipment/Devices: no    Discharge Condition: stable  CODE STATUS:full  Diet recommendation: heart healthy   Brief/Interim Summary: 48 year old male who presented with chest pain.  He does have significant past medical history for aortic dissection status post endoluminal stent grafting 2019, coronary artery disease, nonischemic cardiomyopathy, heart failure, chronic kidney disease and tobacco abuse.  He reported chest pain and headache.  Left-sided chest pain radiating to his left arm, associated with paresthesias.  Ongoing symptoms for a few weeks.  On his initial physical examination blood pressure 185/102, heart rate 60, respiratory rate 21, oxygen saturation 98%, his lungs were clear to auscultation bilaterally, heart S1-S2 present and rhythmic, his abdomen was soft nontender, no lower extremity edema.  Sodium 138, potassium 3.7, chloride 104, bicarb 21, glucose 79, BUN 21, creatinine 2.0, high sensitive troponin  148, white count 7.0, hemoglobin 15.1, hematocrit 46.9, platelets 123.  SARS COVID-19 was negative. CT chest and chest film with no acute changes.  EKG 80 bpm, left axis deviation, normal intervals, sinus rhythm with left atrial enlargement, ST segment depression with T wave inversions in lead I, aVL, V4 through V6, positive LVH, (chronic changes).  Patient was admitted to the hospital with the working diagnosis of non-ST elevation myocardial infarction.  1.  Non-ST elevation  myocardial infarction, in the setting of history of coronary artery disease status post stent.  Patient was admitted to the progressive care unit, he was placed on unfractionated IV heparin.  His troponin peaked at 2,314.  No further significant chest pain. He underwent cardiac catheterization which showed large, tortuous coronary arteries, mild to moderate disease involving the main branches.  No significant change from September 2019.  Patent ostial ramus intermedius stent.  It was recommended patient to continue aggressive medical therapy with clopidogrel and aspirin.  2.  Uncontrolled hypertension.  His blood pressure was difficult to control on multiple agents, apparently he spent a few days off clonidine patch before hospitalization.  His discharge blood pressure is 165/89.  He will continue taking amlodipine, carvedilol, clonidine, hydralazine has been increased to 3 times daily and isosorbide has been added to his regimen.  3.  Chronic kidney disease 3b.  His kidney function remained stable with a creatinine 2.1-2.3.  He received saline hydration for contrast nephropathy prophylaxis.  His discharge creatinine is 2.2, serum bicarbonate 23 potassium 3.7, he will follow-up as an outpatient, continue blood pressure control.  4. Dyslipidemia.  Continue management with ezetimibe and rosuvastatin.  5.  Nonischemic cardiomyopathy, systolic heart failure.  Clinically patient had no decompensation, further work-up with echocardiography showed a left ventricle systolic function 45 to 41% with severe concentric left ventricular hypertrophy.  LV had a bright speckled pattern, work-up as an outpatient revealed no evidence of amyloidosis.    Discharge Diagnoses:  Principal Problem:   NSTEMI (non-ST elevated myocardial infarction) ([place]) Active Problems:   Hyperlipidemia LDL goal <70   Uncontrolled hypertension   Hypertensive cardiomyopathy, without heart failure ([place]): EF reduced to 35-40% from 55%.  CKD  (chronic kidney disease) stage 3, GFR 30-59 ml/min (HCC)    Discharge Instructions   Allergies as of 09/28/2018      Reactions   Imdur [isosorbide Nitrate] Other (See Comments)   Headaches   Tramadol Other (See Comments)   Headaches      Medication List    TAKE these medications   acetaminophen 325 MG tablet Commonly known as: TYLENOL Take 650 mg by mouth every 6 (six) hours as needed (for pain or headaches).   amLODipine 10 MG tablet Commonly known as: NORVASC Take 1 tablet (10 mg total) by mouth daily.   aspirin 81 MG EC tablet Take 1 tablet (81 mg total) by mouth daily. Notes to patient: 09/29/18   carvedilol 25 MG tablet Commonly known as: COREG Take 1 tablet (25 mg total) by mouth 2 (two) times daily with a meal.   cloNIDine 0.3 mg/24hr patch Commonly known as: CATAPRES - Dosed in mg/24 hr Place 1 patch (0.3 mg total) onto the skin once a week. What changed: when to take this   clopidogrel 75 MG tablet Commonly known as: PLAVIX Take 1 tablet (75 mg total) by mouth daily with breakfast. Notes to patient: 09/29/18   ezetimibe 10 MG tablet Commonly known as: ZETIA Take 1 tablet (10 mg total) by mouth daily. Notes to patient: 09/29/18   furosemide 40 MG tablet Commonly known as: LASIX Take one tablet daily as needed for shortness of breath or weight gain of more than 3 lbs in one day. What changed:   how much to take  how to take this  when to take this  reasons to take this  additional instructions   hydrALAZINE 100 MG tablet Commonly known as: APRESOLINE Take 1 tablet (100 mg total) by mouth every 8 (eight) hours. What changed: when to take this   isosorbide mononitrate 30 MG 24 hr tablet Commonly known as: IMDUR Take 1 tablet (30 mg total) by mouth daily. Start taking on: September 29, 2018   pantoprazole 40 MG tablet Commonly known as: PROTONIX Take 1 tablet (40 mg total) by mouth daily. Notes to patient: 09/29/18   rosuvastatin 40 MG  tablet Commonly known as: CRESTOR Take 1 tablet (40 mg total) by mouth daily. Notes to patient: 09/29/18      Follow-up Information    Clegg, Malachi Bonds, NP Follow up on 10/03/2018.   Specialty: Cardiology Why: Please arrive 15 minutes early for your 2:30pm post-hospital follow-up appointment. The parking code is [id number].  Contact information: [address] [place] [address] [phone]          Allergies  Allergen Reactions  . Imdur [Isosorbide Nitrate] Other (See Comments)    Headaches   . Tramadol Other (See Comments)    Headaches     Consultations:  Cardiology   Nephrology    Procedures/Studies: Dg Chest 2 View  Result Date: 09/24/2018 CLINICAL DATA:  Chest pain, headache, hypertension EXAM: CHEST - 2 VIEW COMPARISON:  09/23/2018 FINDINGS: Unchanged examination. No acute abnormality of the lungs. Cardiomegaly status post median sternotomy with surgical aortic annulus and extensive multi component stent endograft repair of the aorta. IMPRESSION: Unchanged examination. No acute abnormality of the lungs. Cardiomegaly status post median sternotomy with surgical aortic annulus and extensive multi component stent endograft repair of the aorta. Electronically Signed   By: Eddie Candle M.D.   On: 09/24/2018 13:10   Dg Chest 2 View  Result Date: 09/23/2018 CLINICAL DATA:  Chest pain EXAM:  CHEST - 2 VIEW COMPARISON:  09/14/2018, 03/25/2018, CT 12/02/2017, radiograph 01/31/2017 FINDINGS: Post sternotomy changes with aortic valve and root prostheses. Similar appearance of thoracic aortic stent graft. Stable pleural and parenchymal scarring at the left base. No acute airspace disease or effusion. No pneumothorax. Stable mild cardiomegaly. IMPRESSION: No active cardiopulmonary disease. Stable postsurgical changes of the mediastinum with pleural and parenchymal scarring at the left base Electronically Signed   By: Donavan Foil M.D.   On: 09/23/2018 21:10   Dg Chest 2 View  Result  Date: 09/14/2018 CLINICAL DATA:  Chronic kidney disease, stage III EXAM: CHEST - 2 VIEW COMPARISON:  03/25/2018 FINDINGS: The lungs are hyperinflated likely secondary to COPD. There is no focal consolidation. There is no pleural effusion or pneumothorax. The heart and mediastinal contours are unremarkable. There is evidence of prior median sternotomy. There is a thoracic aortic stent graft present. There is diffuse osteosclerosis as can be seen with renal osteodystrophy. IMPRESSION: No active cardiopulmonary disease. Electronically Signed   By: Kathreen Devoid   On: 09/14/2018 15:17   Ct Angio Chest/abd/pel For Dissection W And/or W/wo  Result Date: 09/24/2018 CLINICAL DATA:  History of aortic dissection with repair EXAM: CT ANGIOGRAPHY CHEST, ABDOMEN AND PELVIS TECHNIQUE: Multidetector CT imaging through the chest, abdomen and pelvis was performed using the standard protocol during bolus administration of intravenous contrast. Multiplanar reconstructed images and MIPs were obtained and reviewed to evaluate the vascular anatomy. CONTRAST:  39mL OMNIPAQUE 350 COMPARISON:  12/02/2017 FINDINGS: CTA CHEST FINDINGS Cardiovascular: Aortic valve replacement is again identified and stable in appearance. Aortic root is stable in appearance measuring approximately 4.2 cm in greatest dimension. The ascending aortic graft is again identified with a stable appearance with Elephant graft giving rise to the brachiocephalic vessels. No focal narrowing is noted. Just beyond the Elephant graft, there is evidence of stent graft placement extending through the aortic arch and into the descending thoracic aorta. No endoleak is identified. The pulmonary artery appears within normal limits although opacification is limited with regards to pulmonary embolism. No pericardial effusion is seen. No other focal abnormality is noted. Mediastinum/Nodes: Thoracic inlet shows no acute abnormality. No sizable hilar or mediastinal adenopathy is  noted. The esophagus as visualized is within normal limits. Lungs/Pleura: Lungs are well aerated bilaterally. Right lung shows no focal infiltrate or sizable effusion. Mild scarring remains at the left lung base similar to that seen on prior exam. No parenchymal nodules are seen. No pneumothorax is noted. Musculoskeletal: Degenerative changes of the thoracic spine are noted. Postsurgical changes consistent with median sternotomy are seen. No acute rib abnormality is noted. Review of the MIP images confirms the above findings. CTA ABDOMEN AND PELVIS FINDINGS VASCULAR Aorta: The known thoracic aortic stent graft extends into the proximal abdominal aorta. A an implanted graft to the celiac axis is identified with patency of the branches of the celiac axis. Additionally graft to the superior mesenteric artery is noted as well. Tortuous graft to the left renal artery is noted. A more inline graft to the right renal artery is noted as well. These are patent. 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 No enhancement is identified. No recurrent dissection is seen. Calcifications of the more distal aorta are noted. Celiac: Widely patent from graft arising from the proximal aorta. SMA: Widely patent from graft arising from the proximal aorta. Renals: Single renal arteries are identified following grafting  to the proximal aorta. IMA: Patent without evidence of aneurysm, dissection, vasculitis or significant stenosis. Iliacs: Atherosclerotic calcifications of the iliac vessels are noted. No focal stenosis is seen. 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 Veins: No venous abnormality is noted. Review of the MIP images confirms the above findings. NON-VASCULAR Hepatobiliary: No focal liver abnormality is seen. No gallstones, gallbladder wall thickening, or biliary dilatation. Pancreas:  Unremarkable. No pancreatic ductal dilatation or surrounding inflammatory changes. Spleen: Normal in size without focal abnormality. Adrenals/Urinary Tract: Adrenal glands are within normal limits. Kidneys demonstrate a normal enhancement pattern bilaterally. No obstructive changes are seen. Mild scarring is noted in the left kidney stable from the previous exam. The bladder is well distended. Stomach/Bowel: The appendix is within normal limits. No obstructive or inflammatory changes of large or small bowel is noted. Stomach is within normal limits. Lymphatic: No lymphatic abnormality is noted. Reproductive: Prostate is unremarkable. Other: No abdominal wall hernia or abnormality. No abdominopelvic ascites. Musculoskeletal: No acute or significant osseous findings. Review of the MIP images confirms the above findings. IMPRESSION: Stable postoperative appearance when compared with the prior exam. Extensive reconstruction of the ascending aorta is seen with aortic valve and elephant graft supplying the brachiocephalic vessels. Thoracic stent graft is noted in place without evidence of endoleak. Extensive postoperative change in the proximal abdominal aorta with patent C2 all of the visceral grafts identified. 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 No acute dissection is seen. No areas of focal hemorrhage are identified. No acute abnormality within the chest or abdomen is noted. Electronically Signed   By: Inez Catalina M.D.   On: 09/24/2018 17:40      Procedures: cardiac catheterization   Subjective: Patient is feeling better, mild headache, no nausea or vomiting, no chest pain or dyspnea.   Discharge Exam: Vitals:   09/28/18 1005 09/28/18 1036  BP:  (!) 165/89  Pulse: 69   Resp: 16   Temp:  98.7 F (37.1 C)  SpO2: 97%    Vitals:   09/28/18 0724 09/28/18 0857 09/28/18 1005 09/28/18 1036  BP: (!)  162/96 (!) 165/89  (!) 165/89  Pulse: 81  69   Resp: 16  16   Temp: 97.6 F (36.4 C)   98.7 F (37.1 C)  TempSrc: Oral   Oral  SpO2: 92%  97%   Weight:      Height:        General: Not in pain or dyspnea  Neurology: Awake and alert, non focal  E ENT: no pallor, no icterus, oral mucosa moist Cardiovascular: No JVD. S1-S2 present, rhythmic, no gallops, rubs, or murmurs. No lower extremity edema. Pulmonary: positive breath sounds bilaterally, adequate air movement, no wheezing, rhonchi or rales. Gastrointestinal. Abdomen with no organomegaly, non tender, no rebound or guarding Skin. No rashes Musculoskeletal: no joint deformities   The results of significant diagnostics from this hospitalization (including imaging, microbiology, ancillary and laboratory) are listed below for reference.     Microbiology: Recent Results (from the past 240 hour(s))  SARS Coronavirus 2 (CEPHEID - Performed in [place] hospital lab), Hosp Order     Status: None   Collection Time: 09/24/18  3:02 AM   Specimen: Nasopharyngeal Swab  Result Value Ref Range Status   SARS Coronavirus 2 NEGATIVE NEGATIVE Final    Comment: (NOTE) If result is NEGATIVE SARS-CoV-2 target nucleic acids are NOT  DETECTED. The SARS-CoV-2 RNA is generally detectable in upper and lower  respiratory specimens during the acute phase of infection. The lowest  concentration of SARS-CoV-2 viral copies this assay can detect is 250  copies / mL. A negative result does not preclude SARS-CoV-2 infection  and should not be used as the sole basis for treatment or other  patient management decisions.  A negative result may occur with  improper specimen collection / handling, submission of specimen other  than nasopharyngeal swab, presence of viral mutation(s) within the  areas targeted by this assay, and inadequate number of viral copies  (<250 copies / mL). A negative result must be combined with clinical  observations, patient history,  and epidemiological information. If result is POSITIVE SARS-CoV-2 target nucleic acids are DETECTED. The SARS-CoV-2 RNA is generally detectable in upper and lower  respiratory specimens dur ing the acute phase of infection.  Positive  results are indicative of active infection with SARS-CoV-2.  Clinical  correlation with patient history and other diagnostic information is  necessary to determine patient infection status.  Positive results do  not rule out bacterial infection or co-infection with other viruses. If result is PRESUMPTIVE POSTIVE SARS-CoV-2 nucleic acids MAY BE PRESENT.   A presumptive positive result was obtained on the submitted specimen  and confirmed on repeat testing.  While 2019 novel coronavirus  (SARS-CoV-2) nucleic acids may be present in the submitted sample  additional confirmatory testing may be necessary for epidemiological  and / or clinical management purposes  to differentiate between  SARS-CoV-2 and other Sarbecovirus currently known to infect humans.  If clinically indicated additional testing with an alternate test  methodology [phone]) is advised. The SARS-CoV-2 RNA is generally  detectable in upper and lower respiratory sp ecimens during the acute  phase of infection. The expected result is Negative. Fact Sheet for Patients:  StrictlyIdeas.no Fact Sheet for Healthcare Providers: BankingDealers.co.za This test is not yet approved or cleared by the [place] FDA and has been authorized for detection and/or diagnosis of SARS-CoV-2 by FDA under an Emergency Use Authorization (EUA).  This EUA will remain in effect (meaning this test can be used) for the duration of the COVID-19 declaration under Section 564(b)(1) of the Act, 21 U.S.C. section 360bbb-3(b)(1), unless the authorization is terminated or revoked sooner. Performed at [facility], [place] [address], [place], [place] [address]   MRSA PCR  Screening     Status: None   Collection Time: 09/24/18 11:31 PM   Specimen: Nasal Mucosa; Nasopharyngeal  Result Value Ref Range Status   MRSA by PCR NEGATIVE NEGATIVE Final    Comment:        The GeneXpert MRSA Assay (FDA approved for NASAL specimens only), is one component of a comprehensive MRSA colonization surveillance program. It is not intended to diagnose MRSA infection nor to guide or monitor treatment for MRSA infections. Performed at [facility], [place] [address] [place], [place] [address]      Labs: BNP (last 3 results) No results for input(s): BNP in the last 8760 hours. Basic Metabolic Panel: Recent Labs  Lab 09/24/18 1341 09/25/18 0309 09/26/18 0249 09/27/18 0412 09/28/18 0247  NA 138 136 137 138 137  K 3.7 3.5 3.4* 3.8 3.7  CL 104 104 106 106 105  CO2 21* 24 23 22 23   GLUCOSE 79 108* 97 80 92  BUN 21* 21* 19 19 21*  CREATININE 2.05* 2.11* 2.13* 2.30* 2.22*  CALCIUM 9.1 8.9 8.6*  8.9 9.1   Liver Function Tests: No results for input(s): AST, ALT, ALKPHOS, BILITOT, PROT, ALBUMIN in the last 168 hours. No results for input(s): LIPASE, AMYLASE in the last 168 hours. No results for input(s): AMMONIA in the last 168 hours. CBC: Recent Labs  Lab 09/23/18 2050 09/24/18 1341 09/25/18 0309 09/26/18 0249 09/27/18 0412  WBC 6.8 7.0 6.3 5.2 4.6  HGB 14.5 15.1 14.4 13.1 13.1  HCT 43.5 46.9 42.5 39.0 39.5  MCV 94.4 96.9 92.8 93.1 95.2  PLT 132* 123* 143* 131* 132*   Cardiac Enzymes: No results for input(s): CKTOTAL, CKMB, CKMBINDEX, TROPONINI in the last 168 hours. BNP: Invalid input(s): POCBNP CBG: No results for input(s): GLUCAP in the last 168 hours. D-Dimer No results for input(s): DDIMER in the last 72 hours. Hgb A1c No results for input(s): HGBA1C in the last 72 hours. Lipid Profile No results for input(s): CHOL, HDL, LDLCALC, TRIG, CHOLHDL, LDLDIRECT in the last 72 hours. Thyroid function studies No results for input(s): TSH, T4TOTAL,  T3FREE, THYROIDAB in the last 72 hours.  Invalid input(s): FREET3 Anemia work up No results for input(s): VITAMINB12, FOLATE, FERRITIN, TIBC, IRON, RETICCTPCT in the last 72 hours. Urinalysis    Component Value Date/Time   COLORURINE YELLOW 01/31/2017 [place] 01/31/2017 1203   LABSPEC >1.046 (H) 01/31/2017 1203   PHURINE 5.0 01/31/2017 1203   GLUCOSEU NEGATIVE 01/31/2017 1203   HGBUR NEGATIVE 01/31/2017 1203   HGBUR negative 02/18/2009 0901   BILIRUBINUR NEGATIVE 01/31/2017 1203   KETONESUR NEGATIVE 01/31/2017 1203   PROTEINUR 100 (A) 01/31/2017 1203   UROBILINOGEN 0.2 01/02/2015 1945   NITRITE NEGATIVE 01/31/2017 1203   LEUKOCYTESUR NEGATIVE 01/31/2017 1203   Sepsis Labs Invalid input(s): PROCALCITONIN,  WBC,  LACTICIDVEN Microbiology Recent Results (from the past 240 hour(s))  SARS Coronavirus 2 (CEPHEID - Performed in [place] hospital lab), Hosp Order     Status: None   Collection Time: 09/24/18  3:02 AM   Specimen: Nasopharyngeal Swab  Result Value Ref Range Status   SARS Coronavirus 2 NEGATIVE NEGATIVE Final    Comment: (NOTE) If result is NEGATIVE SARS-CoV-2 target nucleic acids are NOT DETECTED. The SARS-CoV-2 RNA is generally detectable in upper and lower  respiratory specimens during the acute phase of infection. The lowest  concentration of SARS-CoV-2 viral copies this assay can detect is 250  copies / mL. A negative result does not preclude SARS-CoV-2 infection  and should not be used as the sole basis for treatment or other  patient management decisions.  A negative result may occur with  improper specimen collection / handling, submission of specimen other  than nasopharyngeal swab, presence of viral mutation(s) within the  areas targeted by this assay, and inadequate number of viral copies  (<250 copies / mL). A negative result must be combined with clinical  observations, patient history, and epidemiological information. If result is  POSITIVE SARS-CoV-2 target nucleic acids are DETECTED. The SARS-CoV-2 RNA is generally detectable in upper and lower  respiratory specimens dur ing the acute phase of infection.  Positive  results are indicative of active infection with SARS-CoV-2.  Clinical  correlation with patient history and other diagnostic information is  necessary to determine patient infection status.  Positive results do  not rule out bacterial infection or co-infection with other viruses. If result is PRESUMPTIVE POSTIVE SARS-CoV-2 nucleic acids MAY BE PRESENT.   A presumptive positive result was obtained on the submitted specimen  and confirmed on repeat testing.  While 2019 novel coronavirus  (SARS-CoV-2) nucleic acids may be present in the submitted sample  additional confirmatory testing may be necessary for epidemiological  and / or clinical management purposes  to differentiate between  SARS-CoV-2 and other Sarbecovirus currently known to infect humans.  If clinically indicated additional testing with an alternate test  methodology [phone]) is advised. The SARS-CoV-2 RNA is generally  detectable in upper and lower respiratory sp ecimens during the acute  phase of infection. The expected result is Negative. Fact Sheet for Patients:  StrictlyIdeas.no Fact Sheet for Healthcare Providers: BankingDealers.co.za This test is not yet approved or cleared by the [place] FDA and has been authorized for detection and/or diagnosis of SARS-CoV-2 by FDA under an Emergency Use Authorization (EUA).  This EUA will remain in effect (meaning this test can be used) for the duration of the COVID-19 declaration under Section 564(b)(1) of the Act, 21 U.S.C. section 360bbb-3(b)(1), unless the authorization is terminated or revoked sooner. Performed at [facility], [place] [address] [place], [place] [address]   MRSA PCR Screening     Status: None   Collection Time:  09/24/18 11:31 PM   Specimen: Nasal Mucosa; Nasopharyngeal  Result Value Ref Range Status   MRSA by PCR NEGATIVE NEGATIVE Final    Comment:        The GeneXpert MRSA Assay (FDA approved for NASAL specimens only), is one component of a comprehensive MRSA colonization surveillance program. It is not intended to diagnose MRSA infection nor to guide or monitor treatment for MRSA infections. Performed at [facility], [place] [address] [place], [place] [address]      Time coordinating discharge: 45 minutes  SIGNED:   Tawni Millers, MD  [facility] 09/28/2018, 11:12 AM

## 2018-09-28 NOTE — TOC Transition Note (Signed)
Transition of Care [facility]) - CM/SW Discharge Note   Patient Details  Name: [name] MRN: [id number] Date of Birth: May 15, 1970  Transition of Care [facility]) CM/SW Contact:  Candie Chroman, LCSW Phone Number: 09/28/2018, 12:09 PM   Clinical Narrative: Patient has orders to discharge home today. Due to high volume of calls at his PCP office, CSW was unable to schedule hospital follow up appt. CSW spoke with someone who will forward the message to them and the secretary will call patient to schedule hospital follow up appt. No further concerns. CSW signing off.    Final next level of care: Home/Self Care Barriers to Discharge: Barriers Resolved   Patient Goals and CMS Choice Patient states their goals for this hospitalization and ongoing recovery are:: Blood pressure and headache control. "My blood pressure hasn't been below 130 since I've been here."      Discharge Placement                    Patient and family notified of of transfer: 09/28/18  Discharge Plan and Services     Post Acute Care Choice: NA                               Social Determinants of Health (SDOH) Interventions     Readmission Risk Interventions Readmission Risk Prevention Plan 09/28/2018  Transportation Screening Complete  HRI or [place] Complete  Social Work Consult for [place] Planning/Counseling Complete  Palliative Care Screening Not Applicable  Medication Review [employer] photographer) Complete  Some recent data might be hidden

## 2018-09-28 NOTE — TOC Initial Note (Signed)
Transition of Care [facility]) - Initial/Assessment Note    Patient Details  Name: [name] MRN: [id number] Date of Birth: 1970-04-01  Transition of Care [facility]) CM/SW Contact:    Candie Chroman, LCSW Phone Number: 09/28/2018, 9:49 AM  Clinical Narrative: Readmission prevention screen complete. Patient home with wife. He drives himself to appts and pharmacy. He gave CSW permission to schedule a hospital follow up appt once stable for discharge. Patient had no home health/DME prior to admission. Patient concerned about headaches and elevated blood pressure that started when he got to the hospital. He stated his baseline blood pressure is 133 and that since he has been in the hospital it hasn't been below 130.                 Expected Discharge Plan: Home/Self Care Barriers to Discharge: Continued Medical Work up   Patient Goals and CMS Choice Patient states their goals for this hospitalization and ongoing recovery are:: Blood pressure and headache control. "My blood pressure hasn't been below 130 since I've been here."      Expected Discharge Plan and Services Expected Discharge Plan: Home/Self Care     Post Acute Care Choice: NA Living arrangements for the past 2 months: Single Family Home                                      Prior Living Arrangements/Services Living arrangements for the past 2 months: Single Family Home Lives with:: Spouse Patient language and need for interpreter reviewed:: Yes(No needs.) Do you feel safe going back to the place where you live?: Yes      Need for Family Participation in Patient Care: No (Comment) Care giver support system in place?: Yes (comment)   Criminal Activity/Legal Involvement Pertinent to Current Situation/Hospitalization: No - Comment as needed  Activities of Daily Living Home Assistive Devices/Equipment: None ADL Screening (condition at time of admission) Patient's cognitive ability adequate to safely complete daily  activities?: Yes Is the patient deaf or have difficulty hearing?: No Does the patient have difficulty seeing, even when wearing glasses/contacts?: No Does the patient have difficulty concentrating, remembering, or making decisions?: No Patient able to express need for assistance with ADLs?: Yes Does the patient have difficulty dressing or bathing?: No Independently performs ADLs?: Yes (appropriate for developmental age) Does the patient have difficulty walking or climbing stairs?: No Weakness of Legs: None Weakness of Arms/Hands: None  Permission Sought/Granted   Permission granted to share information with : No              Emotional Assessment Appearance:: Appears stated age Attitude/Demeanor/Rapport: Engaged, Gracious Affect (typically observed): Accepting, Frustrated, Appropriate Orientation: : Oriented to Self, Oriented to Place, Oriented to  Time, Oriented to Situation Alcohol / Substance Use: Tobacco Use, Alcohol Use, Illicit Drugs(History of THC and ETOH use. Per chart quit using ETOH in 2013.) Psych Involvement: No (comment)  Admission diagnosis:  NSTEMI (non-ST elevated myocardial infarction) [facility]) [I21.4] Patient Active Problem List   Diagnosis Date Noted  . Chest pain, rule out acute myocardial infarction 09/24/2018  . Chronic systolic (congestive) heart failure ([place]) 04/25/2018  . Acute on chronic combined systolic and diastolic CHF (congestive heart failure) ([place]) 12/08/2017  . Coronary artery disease involving native coronary artery of native heart with angina pectoris ([place]) 12/06/2017  . NSTEMI (non-ST elevated myocardial infarction) ([place]) 12/02/2017  . Hypertensive emergency - Accelerated Hypertension  12/02/2017  . History of stroke 10/05/2017  . Somnolence, daytime 04/07/2017  . Snoring 04/07/2017  . Cerebral infarction due to embolism of left middle cerebral artery ([place]) 06/03/2016  . History of aortic dissection 06/03/2016  . Palpitation   .  Cerebrovascular accident (CVA) due to embolism of left middle cerebral artery ([place])   . Seizures ([place]) 02/28/2016  . HLD (hyperlipidemia) 08/28/2015  . 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[place]) 06/07/2015  . Cardiomyopathy ([place]) 03/01/2015  . Acid reflux 03/01/2015  . Essential hypertension 03/01/2015  . S/P aortic dissection repair 10/03/2014  . H/O aortic valve replacement 10/03/2014  . CKD (chronic kidney disease) stage 3, GFR 30-59 ml/min (HCC) 05/15/2014  . AKI (acute kidney injury) ([place])   . 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 Aortic dissection ([place]) 04/23/2014  . Aneurysm of ascending aorta (HCC) 03/28/2014  . Dyspnea 03/27/2014  . ETOH abuse 09/20/2013  . Malignant hypertension 02/20/2013  . Hypertensive urgency 01/27/2012  . Cardiomyopathy, hypertensive ([place]) 01/26/2012  . AI (aortic insufficiency) 01/26/2012  . MR (mitral regurgitation) 01/26/2012  . Acute renal failure superimposed on stage 3 chronic kidney disease ([place]) 01/26/2012  . Hypertensive cardiomyopathy, without heart failure ([place]): EF reduced to 35-40% from 55%. 06/10/2011  . Chronic bronchitis 05/23/2011  . Cardiomegaly - hypertensive 05/22/2011  . Marijuana use 05/22/2011  . Abdominal pain 05/22/2011  . Hypertension, malignant 05/22/2011  . Anxiety and depression 05/22/2011  . Hyperlipidemia LDL goal <70 08/26/2009  . Tobacco use 08/26/2009  . Headache(784.0) 08/26/2009  . Aortic valve disorder 03/11/2009  . INGUINAL HERNIA 02/18/2009  . Uncontrolled hypertension 01/16/2009   PCP:  Clent Demark, PA-C Pharmacy:   [facility] [place], [address] AT [address] [place] [address] Phone: [phone] Fax: [phone]     Social Determinants of Health (SDOH) Interventions    Readmission Risk Interventions Readmission Risk Prevention Plan 09/28/2018  Transportation Screening  Complete  HRI or [place] Complete  Social Work Consult for [place] Planning/Counseling Complete  Palliative Care Screening Not Applicable  Medication Review Press photographer) Complete  Some recent data might be hidden

## 2018-09-28 NOTE — Plan of Care (Signed)

## 2018-09-28 NOTE — Progress Notes (Signed)
[facility] Progress Note   Subjective:   C/o HA present since admitted, not worse after dose of Imdur yesterday.  Ready for D/C home.    Objective Vitals:   09/27/18 2300 09/28/18 0405 09/28/18 0619 09/28/18 0724  BP: (!) 160/96 (!) 150/93 136/73   Pulse: 61 68    Resp: 13 15    Temp: 98.2 F (36.8 C) 98.1 F (36.7 C)  97.6 F (36.4 C)  TempSrc: Oral Oral  Oral  SpO2: 97% 97%    Weight:      Height:       Physical Exam Gen: well appearing  Eyes: acincteric ENT: MMM  Neck: supple no JVD CV: IV/VI SEM LUSB,RRR Abd: soft, nontender Lungs: clear to bases GU: no foley Extr:  No edema Neuro: nonfocal  Additional Objective Labs: Basic Metabolic Panel: Recent Labs  Lab 09/26/18 0249 09/27/18 0412 09/28/18 0247  NA 137 138 137  K 3.4* 3.8 3.7  CL 106 106 105  CO2 23 22 23   GLUCOSE 97 80 92  BUN 19 19 21*  CREATININE 2.13* 2.30* 2.22*  CALCIUM 8.6* 8.9 9.1   Liver Function Tests: No results for input(s): AST, ALT, ALKPHOS, BILITOT, PROT, ALBUMIN in the last 168 hours. No results for input(s): LIPASE, AMYLASE in the last 168 hours. CBC: Recent Labs  Lab 09/23/18 2050 09/24/18 1341 09/25/18 0309 09/26/18 0249 09/27/18 0412  WBC 6.8 7.0 6.3 5.2 4.6  HGB 14.5 15.1 14.4 13.1 13.1  HCT 43.5 46.9 42.5 39.0 39.5  MCV 94.4 96.9 92.8 93.1 95.2  PLT 132* 123* 143* 131* 132*   Blood Culture    Component Value Date/Time   SDES URINE, RANDOM 02/29/2016 0544   SPECREQUEST NONE 02/29/2016 0544   CULT NO GROWTH 02/29/2016 0544   REPTSTATUS 03/01/2016 FINAL 02/29/2016 0544    Cardiac Enzymes: No results for input(s): CKTOTAL, CKMB, CKMBINDEX, TROPONINI in the last 168 hours. CBG: No results for input(s): GLUCAP in the last 168 hours. Iron Studies: No results for input(s): IRON, TIBC, TRANSFERRIN, FERRITIN in the last 72 hours. @lablastinr3 @ Studies/Results: No results found. Medications: . sodium chloride    . sodium chloride     .  amLODipine  10 mg Oral Daily  . aspirin EC  81 mg Oral Daily  . carvedilol  25 mg Oral BID WC  . [START ON 10/01/2018] cloNIDine  0.3 mg Transdermal Q Sat  . clopidogrel  75 mg Oral Q breakfast  . ezetimibe  10 mg Oral Daily  . heparin  5,000 Units Subcutaneous Q8H  . hydrALAZINE  100 mg Oral Q8H  . isosorbide mononitrate  30 mg Oral Daily  . pantoprazole  40 mg Oral Daily  . rosuvastatin  40 mg Oral Daily  . sodium chloride flush  3 mL Intravenous Once  . sodium chloride flush  3 mL Intravenous Q12H    Assessment/Plan: **HTN: per review of historic notes seems BP has been challenging to control but per his report were perfect on home on validated cuff.  Have been higher here - could be in part due to volume expansion from pericath hydration but after long discussion yesterday he was agreeable to starting imdur due to fairly elevated BPs here.  Started imdur 30 and overnight BPs into the 130s.   He's very attentive to his BP and I think it's reasonable to discharge him with home monitoring with close f/u in clinic.  He is aware to hold the imdur if BP < 120.  Also aware that HA with imdur usually lasts 1 week and abates.    **AKI on CKD: baseline cr recently ~2 and in the setting of LHC 7/13 was 2.3 yesterday, 2.2 today.  He did receive pericath hydration.  I expect this is mild contrast nephropathy.  Expect it likely will be reversible but time will tell.  Discussed factors to delay CKD progression with him today and he's aware.   **HFrEF 35-40%: follows with cardiology, medically optimized; euvolemic.    Arranging with clinic for 2 wk follow up -- he will be contacted with date and time; he has [facility] to [facility] before and is aware of appt pending.  OK to discharge from my perspective, will sign off.   Jannifer Hick MD 09/28/2018, 8:31 AM  [facility] Pager: [phone]

## 2018-09-29 ENCOUNTER — Telehealth (INDEPENDENT_AMBULATORY_CARE_PROVIDER_SITE_OTHER): Payer: Self-pay

## 2018-09-29 ENCOUNTER — Telehealth: Payer: Self-pay | Admitting: Internal Medicine

## 2018-09-29 NOTE — Telephone Encounter (Addendum)
Transition Care Management Follow-up Telephone Call  Date of discharge and from where: 09/28/2018, [facility]   How have you been since you were released from the hospital? He said that he is " trying to stay patient."   Any questions or concerns?no questions/concerns reported.   Items Reviewed:  Did the pt receive and understand the discharge instructions provided? He said that he has all instructions and did not have any questions. He said that they hospital staff reviewed the instructions and medications with him prior to discharge.   Medications obtained and verified? He said that he has all medications and does not have any questions. He did not want to review his medication list,   Any new allergies since your discharge? None reported   Do you have support at home? Yes, his wife and daughter  Other (ie: DME, Home Health, etc) he said that he has a scale and weighs himself every morning.  Michela Pitcher is weight this morning was 155 lbs which is the same as it has been. Reviewed when to call provider with weight gain.   No other DME in home as per patient. No home health ordered.   Functional Questionnaire: (I = Independent and D = Dependent) ADL's: *independent   Follow up appointments reviewed:    PCP Hospital f/u appt confirmed? Appointment scheduled for 10/04/2018 @ 1050 with PCP. Informed him that he would be notified if the appointment is in person or virtual.   [facility] f/u appt confirmed? He is aware of cardiology appointment tomorrow - 09/30/2018 @ 1000 - virtual appointment. He said that he has the papers with all of the appointments listed on it.  Are transportation arrangements needed?  He did not report any issues with transportation.   If their condition worsens, is the pt aware to call  their PCP or go to the ED? Yes  Was the patient provided with contact information for the PCP's office or ED? He has the phone number  Was the pt encouraged to call  back with questions or concerns? yes

## 2018-09-29 NOTE — Telephone Encounter (Signed)
New Message ° ° ° ° ° ° °Virtual Visit Pre-Appointment Phone Call ° °"(Name), I am calling you today to discuss your upcoming appointment. We are currently trying to limit exposure to the virus that causes COVID-19 by seeing patients at home rather than in the office." ° ° ° °1. Confirm consent - "In the setting of the current Covid19 crisis, you are scheduled for a (phone or video) visit with your provider on (date) at (time).  Just as we do with many in-office visits, in order for you to participate in this visit, we must obtain consent.  If you'd like, I can send this to your mychart (if signed up) or email for you to review.  Otherwise, I can obtain your verbal consent now.  All virtual visits are billed to your insurance company just like a normal visit would be.  By agreeing to a virtual visit, we'd like you to understand that the technology does not allow for your provider to perform an examination, and thus may limit your provider's ability to fully assess your condition. If your provider identifies any concerns that need to be evaluated in person, we will make arrangements to do so.  Finally, though the technology is pretty good, we cannot assure that it will always work on either your or our end, and in the setting of a video visit, we may have to convert it to a phone-only visit.  In either situation, we cannot ensure that we have a secure connection.  Are you willing to proceed?" STAFF: Did the patient verbally acknowledge consent to telehealth visit? Document YES/NO here: YES ° ° ° ° ° ° °FULL LENGTH CONSENT FOR TELE-HEALTH VISIT  ° °I hereby voluntarily request, consent and authorize [facility] and its employed or contracted physicians, physician assistants, nurse practitioners or other licensed health care professionals (the Practitioner), to provide me with telemedicine health care services (the “Services") as deemed necessary by the treating Practitioner. I acknowledge and consent to receive the  Services by the Practitioner via telemedicine. I understand that the telemedicine visit will involve communicating with the Practitioner through live audiovisual communication technology and the disclosure of certain medical information by electronic transmission. I acknowledge that I have been given the opportunity to request an in-person assessment or other available alternative prior to the telemedicine visit and am voluntarily participating in the telemedicine visit. ° °I understand that I have the right to withhold or withdraw my consent to the use of telemedicine in the course of my care at any time, without affecting my right to future care or treatment, and that the Practitioner or I may terminate the telemedicine visit at any time. I understand that I have the right to inspect all information obtained and/or recorded in the course of the telemedicine visit and may receive copies of available information for a reasonable fee.  I understand that some of the potential risks of receiving the Services via telemedicine include:  °• Delay or interruption in medical evaluation due to technological equipment failure or disruption; °• Information transmitted may not be sufficient (e.g. poor resolution of images) to allow for appropriate medical decision making by the Practitioner; and/or  °• In rare instances, security protocols could fail, causing a breach of personal health information. ° °Furthermore, I acknowledge that it is my responsibility to provide information about my medical history, conditions and care that is complete and accurate to the best of my ability. I acknowledge that Practitioner's advice, recommendations, and/or decision may be   based on factors not within their control, such as incomplete or inaccurate data provided by me or distortions of diagnostic images or specimens that may result from electronic transmissions. I understand that the practice of medicine is not an exact science and that  Practitioner makes no warranties or guarantees regarding treatment outcomes. I acknowledge that I will receive a copy of this consent concurrently upon execution via email to the email address I last provided but may also request a printed copy by calling the office of [facility].   ° °I understand that my insurance will be billed for this visit.  ° °I have read or had this consent read to me. °• I understand the contents of this consent, which adequately explains the benefits and risks of the Services being provided via telemedicine.  °• I have been provided ample opportunity to ask questions regarding this consent and the Services and have had my questions answered to my satisfaction. °• I give my informed consent for the services to be provided through the use of telemedicine in my medical care ° °By participating in this telemedicine visit I agree to the above. ° °

## 2018-09-30 ENCOUNTER — Telehealth: Payer: Self-pay | Admitting: Internal Medicine

## 2018-09-30 ENCOUNTER — Encounter: Payer: Self-pay | Admitting: Internal Medicine

## 2018-09-30 ENCOUNTER — Telehealth (INDEPENDENT_AMBULATORY_CARE_PROVIDER_SITE_OTHER): Payer: Medicare HMO | Admitting: Internal Medicine

## 2018-09-30 VITALS — BP 138/94 | HR 74 | Ht 71.0 in | Wt 156.0 lb

## 2018-09-30 DIAGNOSIS — I7123 Aneurysm of the descending thoracic aorta, without rupture: Secondary | ICD-10-CM

## 2018-09-30 DIAGNOSIS — I359 Nonrheumatic aortic valve disorder, unspecified: Secondary | ICD-10-CM

## 2018-09-30 DIAGNOSIS — I63412 Cerebral infarction due to embolism of left middle cerebral artery: Secondary | ICD-10-CM | POA: Diagnosis not present

## 2018-09-30 DIAGNOSIS — I7101 Dissection of thoracic aorta: Secondary | ICD-10-CM

## 2018-09-30 DIAGNOSIS — I214 Non-ST elevation (NSTEMI) myocardial infarction: Secondary | ICD-10-CM

## 2018-09-30 DIAGNOSIS — E785 Hyperlipidemia, unspecified: Secondary | ICD-10-CM | POA: Diagnosis not present

## 2018-09-30 DIAGNOSIS — I25119 Atherosclerotic heart disease of native coronary artery with unspecified angina pectoris: Secondary | ICD-10-CM

## 2018-09-30 DIAGNOSIS — I5022 Chronic systolic (congestive) heart failure: Secondary | ICD-10-CM

## 2018-09-30 MED ORDER — REPATHA SURECLICK 140 MG/ML ~~LOC~~ SOAJ: 1.0000 | SUBCUTANEOUS | 11 refills | Status: DC

## 2018-09-30 NOTE — Telephone Encounter (Signed)
Patient called to review e-visit instructions. Patient aware that the following changes have been made: start repatha 140mg /ml every 14 days Patient aware that they will need the following labs: fasting lipid panel in 3-4 months, prior to next visit Patient aware that they will need the following test(s): none  Recall for 3-4 month lipid clinic entered.   No further assistance needed at this time.

## 2018-09-30 NOTE — Progress Notes (Signed)
Virtual Visit via Video Note   This visit type was conducted due to national recommendations for restrictions regarding the COVID-19 Pandemic (e.g. social distancing) in an effort to limit this patient's exposure and mitigate transmission in our community.  Due to his co-morbid illnesses, this patient is at least at moderate risk for complications without adequate follow up.  This format is felt to be most appropriate for this patient at this time.  All issues noted in this document were discussed and addressed.  A limited physical exam was performed with this format.  Please refer to the patient's chart for his consent to telehealth for [facility].   Evaluation Performed:  Doxy.me video visit  Date:  09/30/2018   ID:  WM SAHAGUN, DOB 08-08-1970, MRN [id number]  Patient Location:  [place] [address]  Provider location:   [address] [place] [address], [place] [address]  PCP:  Clent Demark, PA-C  Cardiologist:  [name], MD Electrophysiologist:  None   Chief Complaint:  Lipid management  History of Present Illness:    [name] is a 48 y.o. male who presents via audio/video conferencing for a telehealth visit today.  Mr. Halley is a pleasant 48 year old male with a complex cardiovascular history.  He is a patient of Dr. Aundra Dubin.  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  He underwent bioprosthetic Bentall and total arch replacement with a staged endovascular repair of the descending aortic aneurysm by Dr. [name].  He also has coronary artery disease and had a prior stent in the LAD, severe hypertension on clonidine and other medications, dyslipidemia, chronic systolic congestive heart failure with LVEF around 40 to 45%, severe LVH, and tobacco abuse in the past.  He was recently admitted this past week for chest pain and non-ST elevation MI.  High-sensitivity  troponin was around 2300 and he underwent repeat heart catheterization.  This demonstrated a 60% stenosis and a first ostial diagonal lesion and a patent ramus intermedius stent without any new significant findings.  Overall it was thought that his symptoms might of been related to hypertensive emergency as he did run out of his clonidine patches due to a problem with home delivery.  He was referred to lipid clinic as he is on maximal lipid lowering therapy however most recent labs on Jul 27, 2018 showed total cholesterol 165, HDL 53, LDL 91 and triglycerides 107.  His goal LDL is less than 70.  The patient does not have symptoms concerning for COVID-19 infection (fever, chills, cough, or new SHORTNESS OF BREATH).    Prior CV studies:   The following studies were reviewed today:  Chart review Lab work  PMHx:  Past Medical History:  Diagnosis Date  . Adenomatous colon polyp 08/2015  . Anxiety   . Aortic disease ([place])   . Aortic dissection ([facility])    a. admx 04/2014 >> L renal infarct; a/c renal failure >> b.  s/p Bioprosthetic Bentall and total arch replacement and staged endovascular repair of descending aortic aneurysm ([facility] - Dr. [name])  . CAD (coronary artery disease)    a. LHC 4/16:  oD1 60%  . Cardiomyopathy ([place])    a. non-ischemic - probably related to untreated HTN and ETOH abuse - Echo 3/13 with EF 35-40% >> b. Echo 4/16: Severe LVH, EF 55-60%, moderate AI, moderate MR, mild LAE, trivial effusion, known type B dissection with  communication between true and false lumens with suprasternal images suggesting dissection plane may propagate to at least left subclavian takeoff, root above aortic valve okay    . Chronic abdominal pain   . Chronic combined systolic and diastolic congestive heart failure ([place])    a. 05/2011: Adm with pulm edema/HTN urgency, EF 35-40% with diffuse hypokinesis and moderate to severe mitral regurgitation. Cardiomyopathy likely due to uncontrolled HTN and ETOH abuse  - cath deferred due to renal insufficiency (felt due to uncontrolled HTN). bJodie Echevaria MV 06/2011: EF 37% and no ischemia or infarction. c. EF 45-50% by echo 01/2012.  [name] Chronic sinusitis   . CKD (chronic kidney disease)    a. 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  peak creatinine 3.46 during admx for aortic dissection 2/16.  sees Dr [name]  . DDD (degenerative disc disease), lumbar   . Depression   . Descending thoracic aortic aneurysm ([place])   . Dissecting aneurysm of thoracic aorta ([place])   . ETOH abuse    a. Reported to have quit 05/2011.  . Frequent headaches   . GERD (gastroesophageal reflux disease)   . Headache(784.0)    "q other day" (08/08/2013)  . Heart murmur   . Hemorrhoid thrombosis   . History of echocardiogram    Echo 1/17:  Severe LVH, EF 55-60%, no RWMA, Gr 2 DD, AVR ok, mild to mod MR, mild LAE, mild reduced RVSF, mod RAE  . History of medication noncompliance   . HYPERLIPIDEMIA   . Hypertension    a. Hx of HTN urgency secondary to noncompliance. b. urinary metanephrine and catecholeamine levels normal 2013.  c. Renal art [name] 1/16:  No evidence of renal artery stenosis noted bilaterally.  . INGUINAL HERNIA   . Pneumonia ~ 2013  . Renal insufficiency   . Serrated adenoma of colon 08/2015  . Stroke ([place])   . Tobacco abuse   . Valvular heart disease    a. Echo 05/2011: moderate to severe eccentric MR and mild to moderate AI with prolapsing left coronary cusp. b. Echo 01/2012: mild-mod AI, mild dilitation of aortic root, mild MR.;  c. Echo 1/16: Severe LVH consistent with hypertrophic cardio myopathy, EF 50%, no RWMA, mod AI, mild MR, mild RAE, dilated Ao root (40 mm);     Past Surgical History:  Procedure Laterality Date  . ANKLE SURGERY Bilateral    Fractures bilaterally  . AORTIC VALVE SURGERY  09/2014  . CORONARY ANGIOGRAPHY N/A 12/06/2017   Procedure: CORONARY ANGIOGRAPHY;  Surgeon: Belva Crome, MD;  Location: [place] [facility];  Service: Cardiovascular;  Laterality:  N/A;  . CORONARY ANGIOGRAPHY N/A 09/26/2018   Procedure: CORONARY ANGIOGRAPHY;  Surgeon: Nelva Bush, MD;  Location: [place] [facility];  Service: Cardiovascular;  Laterality: N/A;  . CORONARY STENT INTERVENTION N/A 12/10/2017   Procedure: CORONARY STENT INTERVENTION;  Surgeon: [name] Sine, MD;  Location: [place] [facility];  Service: Cardiovascular;  Laterality: N/A;  . FOOT FRACTURE SURGERY Bilateral 2004-2010   "got pins in both of them"  . HEMORRHOID SURGERY N/A 06/15/2015   Procedure: HEMORRHOIDECTOMY;  Surgeon: Stark Klein, MD;  Location: [place];  Service: General;  Laterality: N/A;  . INGUINAL HERNIA REPAIR Right ~ 1996  . LEFT HEART CATHETERIZATION WITH CORONARY ANGIOGRAM N/A 06/21/2014   Procedure: LEFT HEART CATHETERIZATION WITH CORONARY ANGIOGRAM;  Surgeon: Larey Dresser, MD;  Location: [facility] LAB;  Service: Cardiovascular;  Laterality: N/A;  . LOOP RECORDER INSERTION N/A 06/19/2016   Procedure:  Loop Recorder Insertion;  Surgeon: Deboraha Sprang, MD;  Location: [place] [facility];  Service: Cardiovascular;  Laterality: N/A;  . TEE WITHOUT CARDIOVERSION N/A 03/12/2016   Procedure: TRANSESOPHAGEAL ECHOCARDIOGRAM (TEE);  Surgeon: Sanda Klein, MD;  Location[facility] ENDOSCOPY;  Service: Cardiovascular;  Laterality: N/A;    FAMHx:  Family History  Problem Relation Age of Onset  . Hypertension Mother   . Hypertension Other   . Colon cancer Paternal Uncle   . Stroke Maternal Aunt   . Heart attack Brother   . Diabetes Maternal Aunt   . Lung cancer Maternal Uncle   . Other Sister        "breathing machine at night"  . Headache Sister   . Thyroid disease Sister   . Throat cancer Neg Hx   . Pancreatic cancer Neg Hx   . Esophageal cancer Neg Hx   . Kidney disease Neg Hx   . Liver disease Neg Hx     SOCHx:   reports that he has been smoking cigarettes. He has a 5.75 pack-year smoking history. He has never used smokeless tobacco. He reports current alcohol use of about 1.0  standard drinks of alcohol per week. He reports current drug use. Drug: Marijuana.  ALLERGIES:  Allergies  Allergen Reactions  . Imdur [Isosorbide Nitrate] Other (See Comments)    Headaches   . Tramadol Other (See Comments)    Headaches     MEDS:  Current Meds  Medication Sig  . acetaminophen (TYLENOL) 325 MG tablet Take 650 mg by mouth every 6 (six) hours as needed (for pain or headaches).   [name] amLODipine (NORVASC) 10 MG tablet Take 1 tablet (10 mg total) by mouth daily.  [name] aspirin EC 81 MG EC tablet Take 1 tablet (81 mg total) by mouth daily.  . carvedilol (COREG) 25 MG tablet Take 1 tablet (25 mg total) by mouth 2 (two) times daily with a meal.  . cloNIDine (CATAPRES - DOSED IN MG/24 HR) 0.3 mg/24hr patch Place 1 patch (0.3 mg total) onto the skin once a week.  . clopidogrel (PLAVIX) 75 MG tablet Take 1 tablet (75 mg total) by mouth daily with breakfast.  . ezetimibe (ZETIA) 10 MG tablet Take 1 tablet (10 mg total) by mouth daily.  . furosemide (LASIX) 40 MG tablet Take one tablet daily as needed for shortness of breath or weight gain of more than 3 lbs in one day. (Patient taking differently: Take 40 mg by mouth daily as needed (shortness of breath or weight gain of more than 3 lbs in one day). )  . hydrALAZINE (APRESOLINE) 100 MG tablet Take 1 tablet (100 mg total) by mouth every 8 (eight) hours.  . isosorbide mononitrate (IMDUR) 30 MG 24 hr tablet Take 1 tablet (30 mg total) by mouth daily.  . pantoprazole (PROTONIX) 40 MG tablet Take 1 tablet (40 mg total) by mouth daily.  . rosuvastatin (CRESTOR) 40 MG tablet Take 1 tablet (40 mg total) by mouth daily.     ROS: Pertinent items noted in HPI and remainder of comprehensive ROS otherwise negative.  Labs/Other Tests and Data Reviewed:    Recent Labs: 12/02/2017: ALT 10 09/27/2018: Hemoglobin 13.1; Platelets 132 09/28/2018: BUN 21; Creatinine, Ser 2.22; Potassium 3.7; Sodium 137   Recent Lipid Panel Lab Results  Component  Value Date/Time   CHOL 165 07/27/2018 11:45 AM   TRIG 107 07/27/2018 11:45 AM   HDL 53 07/27/2018 11:45 AM   CHOLHDL 3.1 07/27/2018 11:45 AM  LDLCALC 91 07/27/2018 11:45 AM    Wt Readings from Last 3 Encounters:  09/30/18 156 lb (70.8 kg)  09/24/18 153 lb (69.4 kg)  09/24/18 153 lb (69.4 kg)     Exam:    Vital Signs:  BP (!) 138/94   Pulse 74   Ht 5\' 11"  (1.803 m)   Wt 156 lb (70.8 kg)   BMI 21.76 kg/m    General appearance: alert and no distress Lungs: No visual respiratory difficulty Extremities: extremities normal, atraumatic, no cyanosis or edema Skin: Skin color, texture, turgor normal. No rashes or lesions Neurologic: Mental status: Alert, oriented, thought content appropriate Psych: Pleasant  ASSESSMENT & PLAN:    1. Mixed hyperlipidemia, goal LDL less than 70 2. Coronary disease with recent NSTEMI 3. History of aortic dissection and repair 4. Prior stroke 5. Systolic congestive heart failure  Mr. Liddy is on max tolerated medical therapy including ezetimibe 10 mg and rosuvastatin 40 mg daily.  LDL remains above goal less than 70.  He is a good candidate for PCSK9 inhibitor.  We will pursue Repatha prior authorization.  We will contact him with that when it is available.  He may also qualify for patient assistance.  Plan to repeat lipids in 3 to 4 months and follow-up with me afterwards.  Thanks again for the kind referral.  COVID-19 Education: The signs and symptoms of COVID-19 were discussed with the patient and how to seek care for testing (follow up with PCP or arrange E-visit).  The importance of social distancing was discussed today.  Patient Risk:   After full review of this patients clinical status, I feel that they are at least moderate risk at this time.  Time:   Today, I have spent 25 minutes with the patient with telehealth technology discussing dyslipidemia, coronary artery disease, target cholesterol therapy.     Medication Adjustments/Labs  and Tests Ordered: Current medicines are reviewed at length with the patient today.  Concerns regarding medicines are outlined above.   Tests Ordered: Orders Placed This Encounter  Procedures  . Lipid panel    Medication Changes: Meds ordered this encounter  Medications  . Evolocumab (REPATHA SURECLICK) 671 MG/ML SOAJ    Sig: Inject 1 Dose into the skin every 14 (fourteen) days.    Dispense:  2 pen    Refill:  11    Disposition:  in 4 month(s)  Pixie Casino, MD, [facility], [place] Director of the Advanced Lipid Disorders &  Cardiovascular Risk Reduction Clinic Diplomate of the American Board of Clinical Lipidology Attending Cardiologist  Direct Dial: [phone]  Fax: [phone]  Website:  [email or web address]  Pixie Casino, MD  09/30/2018 10:29 AM

## 2018-09-30 NOTE — Patient Instructions (Signed)
Medication Instructions:  Dr. Debara Pickett recommends Repatha 140mg /mL - once every 14 days (PCSK9). This is an injectable cholesterol medication. This medication will need prior approval with your insurance company, which we will work on. If the medication is not approved initially, we may need to do an appeal with your insurance. We will keep you updated on this process. This medication can be provided at some local pharmacies or be shipped to you from a specialty pharmacy.   If you need a refill on your cardiac medications before your next appointment, please call your pharmacy.   Lab work: FASTING lab work to check cholesterol in about 3-4 months If you have labs (blood work) drawn today and your tests are completely normal, you will receive your results only by: [name] MyChart Message (if you have MyChart) OR . A paper copy in the mail If you have any lab test that is abnormal or we need to change your treatment, we will call you to review the results.  Testing/Procedures: NONE  Follow-Up: Dr. Debara Pickett recommends that you schedule a follow up visit with him the in the [place] in 3-4 months.  Please have fasting blood work about 1 week prior to this visit and he will review the blood work results with you at your appointment.

## 2018-09-30 NOTE — Telephone Encounter (Signed)
PA for Repatha [employer] submitted to [facility] via [email or web address]  (Key: [id number])  Your request has been approved PA Case: [id number], Status: Approved, Coverage Starts on: 09/30/2018 12:00:00 AM, Coverage Ends on: 03/29/2019 12:00:00 AM

## 2018-10-02 LAB — OPIATES CONFIRMATION, URINE
CODEINE: NEGATIVE
MORPHINE: POSITIVE — AB
Morphine GC/MS Conf: 1823 ng/mL
OPIATES: POSITIVE — AB

## 2018-10-02 LAB — PANEL 799049
CARBOXY THC GC/MS CONF: 84 ng/mL
Cannabinoid GC/MS, Ur: POSITIVE — AB

## 2018-10-02 LAB — URINE DRUGS OF ABUSE SCREEN W ALC, ROUTINE (REF LAB)
Amphetamines, Urine: NEGATIVE ng/mL
Barbiturate, Ur: NEGATIVE ng/mL
Benzodiazepine Quant, Ur: NEGATIVE ng/mL
Cocaine (Metab.): NEGATIVE ng/mL
Ethanol U, Quan: NEGATIVE %
Methadone Screen, Urine: NEGATIVE ng/mL
Phencyclidine, Ur: NEGATIVE ng/mL
Propoxyphene, Urine: NEGATIVE ng/mL

## 2018-10-02 NOTE — Progress Notes (Signed)
Advanced Heart Failure Clinic Note   Referring Physician: Almyra Deforest PA-C Primary Care: Domenica Fail PA-C  Primary Cardiologist: Dr Aundra Dubin  Nephrology: Dr [name] [facility]: Dr [name]  [facility] Surgeon  HPI: [name] is a 48 y.o. 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  The descending thoracic aorta has dilated to 5.1 cm.  Cardiac catheterization in April 2016 demonstrated 60% first diagonal lesion.  He underwent aortic valve replacement with bovine pericardial tissue valve and reconstruction along with aortic root replacement and endovascular repair of the descending thoracic aortic aneurysm at [facility].  He has had intermittent chest discomfort since then.  Admitted in 12/02/2017 chest pain. Had NSTEMI. He had LHC with stent placed proximal ramus. He underwent staged PCI of of ramus intermedius. Continuew asa 81 mg daily + plavix.    Admitted 09/2018 with chest pain and shortness of breath. Prior to admit he had been out of clonidine for 2 weeks. HS Troponin [id number]/  LHC 7/13 completed and was unchanged. Large, tortuous coronary arteries, mild to moderate disease involving the main branches. Appearance is similar to prior catheterizations in 11/2017,Patent ostial ramus intermedius stent. ECHO was completed and showed EF 40-45% and severe LVH with bright speckled appearance.    Today he returns for HF follow up. Overall feeling fine. Having difficulty with his left eye for the last 5 months. Remains SOB with exertion. Denies PND/Orthopnea. No chest pain.  Appetite ok. No fever or chills. Weight at home 155-160  pounds.  Taking all medications. He continues to smoke 4 cigarettes per day.   PYP scan 04/05/18 Grade 1, H/CL 1.1 -> NOT suggestive of TTR amyloidosis.   Review of systems complete and found to be negative unless listed in HPI.    PMH: 1. H/o ankle fractures bilaterally 2. GERD 3. HTN: Negative urinary catecholeamine collection for pheochromocytoma.  4. CKD: Stage 3. Suspect hypertensive nephropathy.  5. Cardiomyopathy: Most likely due to heavy ETOH ingestion and uncontrolled HTN.   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  Lexiscan myoview (4/13): EF 37%, global hypokinesis, no ischemic or infarction.  Echo (12/14) with EF 45-50%, diffuse hypokinesis, moderate LVH, mild MR, moderate AI.  Echo (4/16) with EF 55-60%, severe LVH, mild LV dilation, moderate AI and moderate MR.   - Echo (9/19): EF 35-40% with diffuse hypokinesis, severe LVH, bioprosthetic aortic valve functioning normally.  6. ETOH abuse: Quit 3/13.  7. Active smoker. 8. Valvular heart disease: Echo in 3/13 showed moderate to severe eccentric MR and mild to moderate AI with prolapsing left coronary cusp. Echo 12/14 with mild MR and moderate AI.  Echo 4/16 with moderate AI and moderate MR.  - S/p Bentall procedure with bioprosthetic aortic valve 9/16.  9.  CVA: Has ILR. 10. Type B aortic dissection: Occurred in 2/16, from just distal to the left subclavian.  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    - 10/03/2014 First stage median sternotomy with right axillary cannulation for  aortic valved conduit root replacement with coronary reconstruction (button Bentall Procedure), 27 mm Sorin Mitroflow bovine pericardial valved conduit, total arch replacement (64mm Vascutek [place] multibranch arch graft with individual head vessel reimplantation. - Status post open repair of an Extent III TAA secondary to  chronic dissection with a # 36mm Vascutek "Coselli" multibranch Dacron graft with individual celiac, SMA, and bilateral renal artery implantationon 01/04/2017, [facility].  11. CAD: LHC (4/16) with 60% ostial D1.   -LHC 09/26/18- large, tortuous coronary arteries, mild to moderate disease involving the main branches. Patent ostial ramus intermedius stent.STEMI 9/19: LHC (9/19) showed 50% proximal LCx, 90% ramus => DES to ramus.  -  Current Outpatient Medications  Medication Sig Dispense Refill  . acetaminophen (TYLENOL) 325 MG tablet Take 650 mg by mouth every 6 (six) hours as needed (for pain or headaches).     [name] amLODipine (NORVASC) 10 MG tablet Take 1 tablet (10 mg total) by mouth daily. 30 tablet 0  . aspirin EC 81 MG EC tablet Take 1 tablet (81 mg total) by mouth daily. 90 tablet 3  . carvedilol (COREG) 25 MG tablet Take 1 tablet (25 mg total) by mouth 2 (two) times daily with a meal. 60 tablet 0  . cloNIDine (CATAPRES - DOSED IN MG/24 HR) 0.3 mg/24hr patch Place 1 patch (0.3 mg total) onto the skin once a week. 4 patch 6  . clopidogrel (PLAVIX) 75 MG tablet Take 1 tablet (75 mg total) by mouth daily with breakfast. 90 tablet 3  . Evolocumab (REPATHA SURECLICK) 956 MG/ML SOAJ Inject 1 Dose into the skin every 14 (fourteen) days. 2 pen 11  . ezetimibe (ZETIA) 10 MG tablet Take 1 tablet (10 mg total) by mouth daily. 90 tablet 3  . furosemide (LASIX) 40 MG tablet Take one tablet daily as needed for shortness of breath or weight gain of more than 3 lbs in one day. (Patient taking differently: Take 40 mg by mouth daily as needed (shortness of breath or weight gain of more than 3 lbs in one day). ) 30 tablet 1  . hydrALAZINE (APRESOLINE) 100 MG tablet Take 1 tablet (100 mg total) by mouth every 8 (eight) hours. 90 tablet 0  . isosorbide mononitrate (IMDUR) 30 MG 24 hr tablet Take 1 tablet (30 mg total) by mouth daily. 30 tablet 0  . pantoprazole (PROTONIX) 40 MG tablet Take 1 tablet (40 mg total) by mouth  daily. 30 tablet 11  . rosuvastatin (CRESTOR) 40 MG tablet Take 1 tablet (40 mg total) by mouth daily. 90 tablet 3   No current facility-administered medications for this encounter.    Allergies  Allergen Reactions  . Imdur [Isosorbide Nitrate] Other (See Comments)    Headaches   . Tramadol Other (See Comments)    Headaches     Social History   Socioeconomic History  . Marital status: Married    Spouse name: Not on file  . Number of children: 5  . Years of education: 42  . Highest education level: Not on file  Occupational History  . Occupation: Disabled, part-time Programmer, systems    Comment: Disability  Social Needs  . Financial resource strain: Not hard at all  . Food insecurity    Worry: Never true    Inability: Never true  . Transportation needs    Medical: No    Non-medical: No  Tobacco Use  . Smoking status: Current Some Day Smoker    Packs/day: 0.25    Years: 23.00  Pack years: 5.75    Types: Cigarettes  . Smokeless tobacco: Never Used  . Tobacco comment: 3 to 4 per day  Substance and Sexual Activity  . Alcohol use: Yes    Alcohol/week: 1.0 standard drinks    Types: 1 Cans of beer per week    Comment: occasionally  . Drug use: Yes    Types: Marijuana    Comment: 2 times/week  . Sexual activity: Yes  Lifestyle  . Physical activity    Days per week: Not on file    Minutes per session: Not on file  . Stress: Not on file  Relationships  . Social Herbalist on phone: Not on file    Gets together: Not on file    Attends religious service: Not on file    Active member of club or organization: Not on file    Attends meetings of clubs or organizations: Not on file    Relationship status: Not on file  . Intimate partner violence    Fear of current or ex partner: Not on file    Emotionally abused: Not on file    Physically abused: Not on file    Forced sexual activity: Not on file  Other Topics Concern  . Not on file  Social History  Narrative   Fun: Enjoy his children   Family History  Problem Relation Age of Onset  . Hypertension Mother   . Hypertension Other   . Colon cancer Paternal Uncle   . Stroke Maternal Aunt   . Heart attack Brother   . Diabetes Maternal Aunt   . Lung cancer Maternal Uncle   . Other Sister        "breathing machine at night"  . Headache Sister   . Thyroid disease Sister   . Throat cancer Neg Hx   . Pancreatic cancer Neg Hx   . Esophageal cancer Neg Hx   . Kidney disease Neg Hx   . Liver disease Neg Hx    Vitals:   10/03/18 1431  BP: (!) 146/82  Pulse: 78  SpO2: 98%  Weight: 69.8 kg    Wt Readings from Last 3 Encounters:  10/03/18 69.8 kg  09/30/18 70.8 kg  09/24/18 69.4 kg   PHYSICAL EXAM: General:  Well appearing. No resp difficulty HEENT: normal Neck: supple. no JVD. Carotids 2+ bilat; no bruits. No lymphadenopathy or thryomegaly appreciated. Cor: PMI nondisplaced. Regular rate & rhythm. No rubs, gallops.  Lungs: clear Abdomen: soft, nontender, nondistended. No hepatosplenomegaly. No bruits or masses. Good bowel sounds. Extremities: no cyanosis, clubbing, rash, edema Neuro: alert & orientedx3, cranial nerves grossly intact. moves all 4 extremities w/o difficulty. Affect pleasant  ASSESSMENT & PLAN: 1. CAD: 9/19 NSTEMI with PCI to proximal ramus. LHC 09/2018 unchangeed.  - No s/s of ischemia.    - Continue ASA 81, Plavix.  - He is on atorvastatin 80 daily.   2. Chronic Systolic Heart Failure: Echo in 9/19 with EF 35-40% with severe LVH and concern for infiltrative process. ECHO 09/2018 EF 45-50%  Myeloma panel negative, suspect due to HTN. Cannot get cardiac MRI with elevated creatinine.  - PYP scan negative for for TTR amyloidosis.  - NYHA IIIb but multifactorial with suspected COPD. He does not appear volume overloaded and does not need diuretics.  - Continue Coreg 25 mg bid.  - Continue hydralazine 100 mg BID -Increase imdur to 60 mg daily.  - Continue  spironolactone 25 mg daily.  3. HTN: -  Elevated.  - Continue amlodipine 10 mg daily, hydralazine 100 mg three times a day, catapres 0.3 patch once weekly. - Increase imdur to 60 mg daily  4. S/P Bioprothetic AVR: Valve was stable on 09/2018 echo.  - No change.  5. S/P 2016 and 2018 thoracic aortic aneurysm repair: Followed by Dr. [name] at [facility]  - No change.  6. CKD Stage III:  I reviewed BMET form 09/28/18 . Check BMET today.  7. Active smoker:  Discussed smoking cessation.  8. PAD -- No claudication - Offered follow up with vascular here, but he prefers to keep follow up with [facility].  9. Dyspnea  PFTs with severe restriction and obstruction. Refer to pulmonary.  10. L Eye  Referred to opthalmology.   Follow up 3 months with Dr Aundra Dubin.   Darrick Grinder, NP  10/03/2018

## 2018-10-03 ENCOUNTER — Encounter (HOSPITAL_COMMUNITY): Payer: Self-pay

## 2018-10-03 ENCOUNTER — Other Ambulatory Visit: Payer: Self-pay

## 2018-10-03 ENCOUNTER — Ambulatory Visit (HOSPITAL_COMMUNITY)
Admission: RE | Admit: 2018-10-03 | Discharge: 2018-10-03 | Disposition: A | Discharge status: Home / Self Care | Payer: Medicare HMO | Source: Ambulatory Visit | Attending: Cardiology | Admitting: Cardiology

## 2018-10-03 VITALS — BP 146/82 | HR 78 | Wt 153.8 lb

## 2018-10-03 DIAGNOSIS — I25119 Atherosclerotic heart disease of native coronary artery with unspecified angina pectoris: Secondary | ICD-10-CM

## 2018-10-03 DIAGNOSIS — I252 Old myocardial infarction: Secondary | ICD-10-CM | POA: Insufficient documentation

## 2018-10-03 DIAGNOSIS — E785 Hyperlipidemia, unspecified: Secondary | ICD-10-CM | POA: Insufficient documentation

## 2018-10-03 DIAGNOSIS — J449 Chronic obstructive pulmonary disease, unspecified: Secondary | ICD-10-CM

## 2018-10-03 DIAGNOSIS — Z7982 Long term (current) use of aspirin: Secondary | ICD-10-CM | POA: Diagnosis not present

## 2018-10-03 DIAGNOSIS — N183 Chronic kidney disease, stage 3 unspecified: Secondary | ICD-10-CM

## 2018-10-03 DIAGNOSIS — Z8673 Personal history of transient ischemic attack (TIA), and cerebral infarction without residual deficits: Secondary | ICD-10-CM | POA: Insufficient documentation

## 2018-10-03 DIAGNOSIS — Z953 Presence of xenogenic heart valve: Secondary | ICD-10-CM | POA: Insufficient documentation

## 2018-10-03 DIAGNOSIS — Z8249 Family history of ischemic heart disease and other diseases of the circulatory system: Secondary | ICD-10-CM | POA: Diagnosis not present

## 2018-10-03 DIAGNOSIS — Z7902 Long term (current) use of antithrombotics/antiplatelets: Secondary | ICD-10-CM | POA: Insufficient documentation

## 2018-10-03 DIAGNOSIS — Z79899 Other long term (current) drug therapy: Secondary | ICD-10-CM | POA: Insufficient documentation

## 2018-10-03 DIAGNOSIS — I130 Hypertensive heart and chronic kidney disease with heart failure and stage 1 through stage 4 chronic kidney disease, or unspecified chronic kidney disease: Secondary | ICD-10-CM | POA: Diagnosis not present

## 2018-10-03 DIAGNOSIS — Z885 Allergy status to narcotic agent status: Secondary | ICD-10-CM | POA: Insufficient documentation

## 2018-10-03 DIAGNOSIS — I429 Cardiomyopathy, unspecified: Secondary | ICD-10-CM | POA: Insufficient documentation

## 2018-10-03 DIAGNOSIS — I2510 Atherosclerotic heart disease of native coronary artery without angina pectoris: Secondary | ICD-10-CM | POA: Diagnosis not present

## 2018-10-03 DIAGNOSIS — F17210 Nicotine dependence, cigarettes, uncomplicated: Secondary | ICD-10-CM | POA: Diagnosis not present

## 2018-10-03 DIAGNOSIS — I5022 Chronic systolic (congestive) heart failure: Secondary | ICD-10-CM

## 2018-10-03 DIAGNOSIS — K219 Gastro-esophageal reflux disease without esophagitis: Secondary | ICD-10-CM | POA: Diagnosis not present

## 2018-10-03 DIAGNOSIS — R0602 Shortness of breath: Secondary | ICD-10-CM

## 2018-10-03 DIAGNOSIS — Z720 Tobacco use: Secondary | ICD-10-CM

## 2018-10-03 MED ORDER — ISOSORBIDE MONONITRATE ER 60 MG PO TB24: 60.0000 mg | ORAL_TABLET | Freq: Every day | ORAL | 6 refills | Status: DC

## 2018-10-03 NOTE — Telephone Encounter (Signed)
Patient notified med has been approved. He was advised to contact [facility], if they have not already contacted him. He was notified to call back if co-pay is not affordable.

## 2018-10-03 NOTE — Progress Notes (Signed)
CSW consulted to help set pt up with eye doctor appt.  CSW met with pt and was able to set up appt for August 19th at 1:30pm.  CSW will continue to follow and assist as needed  Jorge Ny, [facility] Desk#: [phone] Cell#: [phone]

## 2018-10-03 NOTE — Patient Instructions (Signed)
INCREASE Imdur to 60mg  (1 tab) daily  You have been referred to the Pulmonary Clinic for your COPD.  They will call you to schedule an appointment.   Your physician recommends that you schedule a follow-up appointment in: 3 months with Dr Aundra Dubin  At the [facility], you and your health needs are our priority. As part of our continuing mission to provide you with exceptional heart care, we have created designated Provider Care Teams. These Care Teams include your primary Cardiologist (physician) and Advanced Practice Providers (APPs- Physician Assistants and Nurse Practitioners) who all work together to provide you with the care you need, when you need it.   You may see any of the following providers on your designated Care Team at your next follow up: [name] Dr Glori Bickers . Dr [name] . Darrick Grinder, NP  '

## 2018-10-04 ENCOUNTER — Encounter (INDEPENDENT_AMBULATORY_CARE_PROVIDER_SITE_OTHER): Payer: Self-pay | Admitting: Primary Care

## 2018-10-04 ENCOUNTER — Ambulatory Visit (INDEPENDENT_AMBULATORY_CARE_PROVIDER_SITE_OTHER): Payer: Medicare HMO | Admitting: Licensed Clinical Social Worker

## 2018-10-04 ENCOUNTER — Ambulatory Visit (INDEPENDENT_AMBULATORY_CARE_PROVIDER_SITE_OTHER): Payer: Medicare HMO | Admitting: Primary Care

## 2018-10-04 VITALS — BP 142/80 | HR 74 | Temp 97.5°F | Ht 71.0 in | Wt 158.0 lb

## 2018-10-04 DIAGNOSIS — F419 Anxiety disorder, unspecified: Secondary | ICD-10-CM

## 2018-10-04 DIAGNOSIS — F5101 Primary insomnia: Secondary | ICD-10-CM

## 2018-10-04 DIAGNOSIS — Z09 Encounter for follow-up examination after completed treatment for conditions other than malignant neoplasm: Secondary | ICD-10-CM

## 2018-10-04 DIAGNOSIS — I358 Other nonrheumatic aortic valve disorders: Secondary | ICD-10-CM

## 2018-10-04 DIAGNOSIS — I5043 Acute on chronic combined systolic (congestive) and diastolic (congestive) heart failure: Secondary | ICD-10-CM

## 2018-10-04 DIAGNOSIS — Z23 Encounter for immunization: Secondary | ICD-10-CM

## 2018-10-04 DIAGNOSIS — F329 Major depressive disorder, single episode, unspecified: Secondary | ICD-10-CM

## 2018-10-04 DIAGNOSIS — F331 Major depressive disorder, recurrent, moderate: Secondary | ICD-10-CM | POA: Diagnosis not present

## 2018-10-04 DIAGNOSIS — Z720 Tobacco use: Secondary | ICD-10-CM

## 2018-10-04 MED ORDER — TRAZODONE HCL 50 MG PO TABS: 25.0000 mg | ORAL_TABLET | Freq: Every evening | ORAL | 3 refills | Status: DC | PRN

## 2018-10-04 NOTE — Patient Instructions (Signed)

## 2018-10-04 NOTE — Progress Notes (Signed)
Left eye for the past three months has been jumping. Feels like it wants to get stuck Burning sensation of top of right foot

## 2018-10-04 NOTE — Progress Notes (Signed)
+++++++++  Established Patient Office Visit  Subjective:  Patient ID: [name], male    DOB: [birth year]  Age: 48 y.o. MRN: [id number]  CC:  Chief Complaint  Patient presents with  . Hospitalization Follow-up    NSTEMI    HPI [name] presents for hospital follow up , and left eye twitches and feels as though the eye is stuck in a position and right foot has burning sensation only on the top of the foot. Past medical history significant of CAD s/p stents, last stent placement in Sept 2019, non-ischemic stress test in May 2020 though he did have EF 32%.  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 Past Medical History:  Diagnosis Date  . Adenomatous colon polyp 08/2015  . Anxiety   . Aortic disease ([place])   . Aortic dissection ([facility])    a. admx 04/2014 >> L renal infarct; a/c renal failure >> b.  s/p Bioprosthetic [place] and total arch replacement and staged endovascular repair of descending aortic aneurysm ([facility] - Dr. [name])  . CAD (coronary artery disease)    a. LHC 4/16:  oD1 60%  . Cardiomyopathy ([place])    a. non-ischemic - probably related to untreated HTN and ETOH abuse - Echo 3/13 with EF 35-40% >> b. Echo 4/16: Severe LVH, EF 55-60%, moderate AI, moderate MR, mild LAE, trivial effusion, known type B dissection with communication between true and false lumens with suprasternal images suggesting dissection plane may propagate to at least left subclavian takeoff, root above aortic valve okay    . Chronic abdominal pain   . Chronic combined systolic and diastolic congestive heart failure ([place])    a. 05/2011: Adm with pulm edema/HTN urgency, EF 35-40% with diffuse hypokinesis and moderate to severe mitral regurgitation. Cardiomyopathy likely due to uncontrolled HTN and ETOH abuse - cath deferred due to renal insufficiency (felt due to uncontrolled HTN). bJodie Echevaria MV 06/2011: EF 37% and no ischemia or infarction. c. EF 45-50% by echo 01/2012.  [name] Chronic sinusitis   . CKD (chronic kidney disease)    a. 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  peak creatinine 3.46 during admx for aortic dissection 2/16.  sees Dr [name]  . DDD (degenerative disc disease), lumbar   . Depression   . Descending thoracic aortic aneurysm ([place])   . Dissecting aneurysm of thoracic aorta ([place])   . ETOH abuse    a. Reported to have quit 05/2011.  . Frequent headaches   . GERD (gastroesophageal reflux disease)   . Headache(784.0)    "q other day" (08/08/2013)  . Heart murmur   . Hemorrhoid thrombosis   . History of echocardiogram    Echo 1/17:  Severe LVH, EF 55-60%, no RWMA, Gr 2 DD, AVR ok, mild to mod MR, mild LAE, mild reduced RVSF, mod RAE  . History of medication noncompliance   . HYPERLIPIDEMIA   . Hypertension    a. Hx of HTN urgency secondary to noncompliance. b. urinary metanephrine and catecholeamine levels normal 2013.  c. Renal art [name] 1/16:  No evidence of renal artery stenosis noted bilaterally.  . INGUINAL HERNIA   . Pneumonia ~ 2013  . Renal insufficiency   . Serrated adenoma of colon 08/2015  . Stroke ([place])   . Tobacco abuse   . Valvular heart disease    a. Echo 05/2011: moderate to  severe eccentric MR and mild to moderate AI with prolapsing left coronary cusp. b. Echo 01/2012: mild-mod AI, mild dilitation of aortic root, mild MR.;  c. Echo 1/16: Severe LVH consistent with hypertrophic cardio myopathy, EF 50%, no RWMA, mod AI, mild MR, mild RAE, dilated Ao root (40 mm);     Past Surgical History:  Procedure Laterality Date  . ANKLE SURGERY Bilateral    Fractures bilaterally  . AORTIC VALVE SURGERY  09/2014  . CORONARY ANGIOGRAPHY N/A 12/06/2017   Procedure: CORONARY ANGIOGRAPHY;  Surgeon: Belva Crome, MD;  Location: [facility];  Service: Cardiovascular;  Laterality: N/A;  . CORONARY ANGIOGRAPHY N/A 09/26/2018   Procedure: CORONARY ANGIOGRAPHY;   Surgeon: Nelva Bush, MD;  Location: [facility];  Service: Cardiovascular;  Laterality: N/A;  . CORONARY STENT INTERVENTION N/A 12/10/2017   Procedure: CORONARY STENT INTERVENTION;  Surgeon: [name] Sine, MD;  Location: [facility];  Service: Cardiovascular;  Laterality: N/A;  . FOOT FRACTURE SURGERY Bilateral 2004-2010   "got pins in both of them"  . HEMORRHOID SURGERY N/A 06/15/2015   Procedure: HEMORRHOIDECTOMY;  Surgeon: Stark Klein, MD;  Location: [facility];  Service: General;  Laterality: N/A;  . INGUINAL HERNIA REPAIR Right ~ 1996  . LEFT HEART CATHETERIZATION WITH CORONARY ANGIOGRAM N/A 06/21/2014   Procedure: LEFT HEART CATHETERIZATION WITH CORONARY ANGIOGRAM;  Surgeon: Larey Dresser, MD;  Location: [facility] CATH LAB;  Service: Cardiovascular;  Laterality: N/A;  . LOOP RECORDER INSERTION N/A 06/19/2016   Procedure: Loop Recorder Insertion;  Surgeon: Deboraha Sprang, MD;  Location: [facility];  Service: Cardiovascular;  Laterality: N/A;  . TEE WITHOUT CARDIOVERSION N/A 03/12/2016   Procedure: TRANSESOPHAGEAL ECHOCARDIOGRAM (TEE);  Surgeon: Sanda Klein, MD;  Location: [facility] ENDOSCOPY;  Service: Cardiovascular;  Laterality: N/A;    Family History  Problem Relation Age of Onset  . Hypertension Mother   . Hypertension Other   . Colon cancer Paternal Uncle   . Stroke Maternal Aunt   . Heart attack Brother   . Diabetes Maternal Aunt   . Lung cancer Maternal Uncle   . Other Sister        "breathing machine at night"  . Headache Sister   . Thyroid disease Sister   . Throat cancer Neg Hx   . Pancreatic cancer Neg Hx   . Esophageal cancer Neg Hx   . Kidney disease Neg Hx   . Liver disease Neg Hx     Social History   Socioeconomic History  . Marital status: Married    Spouse name: Not on file  . Number of children: 5  . Years of education: 64  . Highest education level: Not on file  Occupational History  . Occupation: Disabled, part-time Programmer, systems     Comment: Disability  Social Needs  . Financial resource strain: Not hard at all  . Food insecurity    Worry: Never true    Inability: Never true  . Transportation needs    Medical: No    Non-medical: No  Tobacco Use  . Smoking status: Current Some Day Smoker    Packs/day: 0.25    Years: 23.00    Pack years: 5.75    Types: Cigarettes  . Smokeless tobacco: Never Used  . Tobacco comment: 3 to 4 per day  Substance and Sexual Activity  . Alcohol use: Yes    Alcohol/week: 1.0 standard drinks    Types: 1 Cans of beer per week  Comment: occasionally  . Drug use: Yes    Types: Marijuana    Comment: 2 times/week  . Sexual activity: Yes  Lifestyle  . Physical activity    Days per week: Not on file    Minutes per session: Not on file  . Stress: Not on file  Relationships  . Social Herbalist on phone: Not on file    Gets together: Not on file    Attends religious service: Not on file    Active member of club or organization: Not on file    Attends meetings of clubs or organizations: Not on file    Relationship status: Not on file  . Intimate partner violence    Fear of current or ex partner: Not on file    Emotionally abused: Not on file    Physically abused: Not on file    Forced sexual activity: Not on file  Other Topics Concern  . Not on file  Social History Narrative   Fun: Enjoy his children    Outpatient Medications Prior to Visit  Medication Sig Dispense Refill  . acetaminophen (TYLENOL) 325 MG tablet Take 650 mg by mouth every 6 (six) hours as needed (for pain or headaches).     [name] amLODipine (NORVASC) 10 MG tablet Take 1 tablet (10 mg total) by mouth daily. 30 tablet 0  . aspirin EC 81 MG EC tablet Take 1 tablet (81 mg total) by mouth daily. 90 tablet 3  . carvedilol (COREG) 25 MG tablet Take 1 tablet (25 mg total) by mouth 2 (two) times daily with a meal. 60 tablet 0  . cloNIDine (CATAPRES - DOSED IN MG/24 HR) 0.3 mg/24hr patch Place 1 patch (0.3 mg  total) onto the skin once a week. 4 patch 6  . clopidogrel (PLAVIX) 75 MG tablet Take 1 tablet (75 mg total) by mouth daily with breakfast. 90 tablet 3  . Evolocumab (REPATHA SURECLICK) 240 MG/ML SOAJ Inject 1 Dose into the skin every 14 (fourteen) days. 2 pen 11  . ezetimibe (ZETIA) 10 MG tablet Take 1 tablet (10 mg total) by mouth daily. 90 tablet 3  . furosemide (LASIX) 40 MG tablet Take one tablet daily as needed for shortness of breath or weight gain of more than 3 lbs in one day. (Patient taking differently: Take 40 mg by mouth daily as needed (shortness of breath or weight gain of more than 3 lbs in one day). ) 30 tablet 1  . hydrALAZINE (APRESOLINE) 100 MG tablet Take 1 tablet (100 mg total) by mouth every 8 (eight) hours. 90 tablet 0  . isosorbide mononitrate (IMDUR) 60 MG 24 hr tablet Take 1 tablet (60 mg total) by mouth daily. 30 tablet 6  . pantoprazole (PROTONIX) 40 MG tablet Take 1 tablet (40 mg total) by mouth daily. 30 tablet 11  . rosuvastatin (CRESTOR) 40 MG tablet Take 1 tablet (40 mg total) by mouth daily. 90 tablet 3   No facility-administered medications prior to visit.     Allergies  Allergen Reactions  . Imdur [Isosorbide Nitrate] Other (See Comments)    Headaches   . Tramadol Other (See Comments)    Headaches     ROS Review of Systems  Constitutional: Positive for fatigue.  Eyes:       Left eye twitches   Neurological: Positive for headaches.       From Imdur patient to call cardiologist   Hematological: Bruises/bleeds easily.  Psychiatric/Behavioral: Positive for sleep disturbance.  Depression       Objective:    Physical Exam  Constitutional: He is oriented to person, place, and time. He appears well-developed and well-nourished.  HENT:  Head: Normocephalic.  Eyes: EOM are normal.  Neck: Normal range of motion.  Cardiovascular: Normal rate and regular rhythm.  Murmur heard. Pulmonary/Chest: Effort normal and breath sounds normal.   Abdominal: Soft. Bowel sounds are normal.  Musculoskeletal: Normal range of motion.  Neurological: He is alert and oriented to person, place, and time.  Skin: Skin is warm.  Psychiatric: He has a normal mood and affect.    BP (!) 142/80 (BP Location: Left Arm, Patient Position: Sitting, Cuff Size: Normal)   Pulse 74   Temp (!) 97.5 F (36.4 C) (Tympanic)   Ht 5\' 11"  (1.803 m)   Wt 158 lb (71.7 kg)   SpO2 96%   BMI 22.04 kg/m  Wt Readings from Last 3 Encounters:  10/04/18 158 lb (71.7 kg)  10/03/18 153 lb 12.8 oz (69.8 kg)  09/30/18 156 lb (70.8 kg)     There are no preventive care reminders to display for this patient.  There are no preventive care reminders to display for this patient.  Lab Results  Component Value Date   TSH 1.490 06/22/2017   Lab Results  Component Value Date   WBC 4.6 09/27/2018   HGB 13.1 09/27/2018   HCT 39.5 09/27/2018   MCV 95.2 09/27/2018   PLT 132 (L) 09/27/2018   Lab Results  Component Value Date   NA 137 09/28/2018   K 3.7 09/28/2018   CO2 23 09/28/2018   GLUCOSE 92 09/28/2018   BUN 21 (H) 09/28/2018   CREATININE 2.22 (H) 09/28/2018   BILITOT 0.8 12/02/2017   ALKPHOS 83 12/02/2017   AST 18 12/02/2017   ALT 10 12/02/2017   PROT 6.6 12/02/2017   ALBUMIN 3.4 (L) 12/02/2017   CALCIUM 9.1 09/28/2018   ANIONGAP 9 09/28/2018   GFR 66.49 08/14/2014   Lab Results  Component Value Date   CHOL 165 07/27/2018   Lab Results  Component Value Date   HDL 53 07/27/2018   Lab Results  Component Value Date   LDLCALC 91 07/27/2018   Lab Results  Component Value Date   TRIG 107 07/27/2018   Lab Results  Component Value Date   CHOLHDL 3.1 07/27/2018   Lab Results  Component Value Date   HGBA1C 5.3 03/04/2016      Assessment & Plan:   Problem List Items Addressed This Visit    Tobacco use (Chronic)   Anxiety and depression   Relevant Medications   traZODone (DESYREL) 50 MG tablet   Acute on chronic combined systolic and  diastolic CHF (congestive heart failure) ([place])    Other Visit Diagnoses    Need for Tdap vaccination    -  Primary   Relevant Orders   Tdap vaccine greater than or equal to 7yo IM (Completed)   Primary insomnia       Aortic heart murmur        [name] was seen today for hospitalization follow-up.  Diagnoses and all orders for this visit:  Need for Tdap vaccination -     Tdap vaccine greater than or equal to 7yo IM  Acute on chronic combined systolic and diastolic CHF (congestive heart failure) ([place]) CAD s/p stents, last stent placement in Sept 2019, non-ischemic stress test in May 2020 though he did have EF 32%. Followed by Dr. Aundra Dubin  Anxiety and  depression We discussed options for treatment of anxiety including therapy and/or medication. Reviewed concept of anxiety as biochemical imbalance of neurotransmitters and rationale for treatment.   Primary insomnia Secondary to anxiety and depression.  Trazodone at bedtime dual purpose for depression and insomnia.  Aortic heart murmur Aortic valve insufficiency a [place] regurgitation to the aortic valve with a pronounce murmur heard  Tobacco use Nicotine can decrease circulation in your body and affect every organ. Reseach shows increase in lung cancer and respiratory problems. When you are ready to stop let's talk. Other orders -     traZODone (DESYREL) 50 MG tablet; Take 0.5-1 tablets (25-50 mg total) by mouth at bedtime as needed for sleep.    Meds ordered this encounter  Medications  . traZODone (DESYREL) 50 MG tablet    Sig: Take 0.5-1 tablets (25-50 mg total) by mouth at bedtime as needed for sleep.    Dispense:  30 tablet    Refill:  3    Follow-up: Return in about 4 weeks (around 11/01/2018) for insomnia and depression.    Kerin Perna, NP

## 2018-10-04 NOTE — BH Specialist Note (Signed)
Integrated Behavioral Health Initial Visit  MRN: [id number] Name: [name]  Number of [place] Clinician visits:: 1/6 Session Start time: 10:40 AM  Session End time: 11:05 AM Total time: 25 minutes  Type of Service: [place] Interpretor:No. Interpretor Name and Language: NA   Warm Hand Off Completed.       SUBJECTIVE: [name] is a 48 y.o. male accompanied by self Patient was referred by NP Oletta Lamas for depression and anxiety. Patient reports the following symptoms/concerns: Pt reports difficulty managing chronic health conditions and chronic pain Duration of problem: Ongoing; Severity of problem: severe  OBJECTIVE: Mood: Anxious and Depressed and Affect: Appropriate Risk of harm to self or others: No plan to harm self or others  LIFE CONTEXT: Family and Social: Pt receives strong support from family and friends School/Work: Pt receives disability  Self-Care: Pt reports inability to do the things he loves due to chronic pain Life Changes: Pt reports difficulty managing chronic health conditions and chronic pain  STRENGTHS: Pt has good support system Pt has good insight  GOALS ADDRESSED: Patient will: 1. Reduce symptoms of: anxiety and depression 2. Increase knowledge and/or ability of: coping skills and healthy habits  3. Demonstrate ability to: Increase healthy adjustment to current life circumstances  INTERVENTIONS: Interventions utilized: Mindfulness or Psychologist, educational, Supportive Counseling and Psychoeducation and/or Health Education  Standardized Assessments completed: GAD-7 and PHQ 2&9  ASSESSMENT: Patient currently experiencing difficulty managing chronic health conditions and chronic pain. 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 Pt has noticed an increase in irritability and withdrawn behavior. He receives strong support from family  and friends. Denies SI/HI.    Patient may benefit from psychotherapy and medication management. LCSW discussed mindfulness strategies and the importance of sleep hygiene to assist in the decrease and/or management of symptoms.   PLAN: 1. Follow up with behavioral health clinician on : Pt encouraged to schedule follow up appointment, if needed 2. Behavioral recommendations: LCSW recommends pt utilize healthy coping skills discussed 3. Referral(s): [place] (In Clinic) 4. "From scale of 1-10, how likely are you to follow plan?": 8  Rebekah Chesterfield, LCSW

## 2018-10-05 ENCOUNTER — Telehealth: Payer: Self-pay

## 2018-10-05 NOTE — Telephone Encounter (Signed)
I called pt. He does not need to see Dr. Rexene Alberts but does want a yearly follow up with the stroke team. An appt was made with Janett Billow, NP on 7/27 at 1:45pm. Pt verbalized understanding of new appt date and time.

## 2018-10-05 NOTE — Telephone Encounter (Signed)
Pt is scheduled for a yearly follow up with Dr. Rexene Alberts. It appears that he is supposed to follow up yearly with the stroke team, not the sleep team.   I called pt to discuss. No answer, left a message asking him to call us back. If pt calls back, please discuss this with him and schedule his yearly f/u with the stroke team.

## 2018-10-06 ENCOUNTER — Ambulatory Visit: Payer: Medicare Other | Admitting: Nurse Practitioner

## 2018-10-06 ENCOUNTER — Ambulatory Visit: Payer: Medicare Other | Admitting: Neurology

## 2018-10-10 ENCOUNTER — Ambulatory Visit: Payer: Self-pay | Admitting: Adult Health

## 2018-10-10 NOTE — Progress Notes (Deleted)
[facility]  PATIENT: [name] DOB: 1970-05-03   REASON FOR VISIT: Follow-up for stroke 02/2016 HISTORY FROM: Patient    HISTORY OF PRESENT ILLNESS:Andre Pullin [name] a 48 y.o.malewith history of aortic dissection s/p repair and reconstruction, systolic and diastolic heart failure, and HTN admitted on 02/28/16 for confusion and right-sided weakness. In ED, had seizure and subsequent cardiac arrest. CT negative for bleeding. CTA without clear LVO, but moderate narrowing of left M2 and right P2. Hewas not considered for IV TPA due to seizure at onset with non-stroke presentation. Intubated and started on Keppra. Post extubation had mild right hemiparesis and word finding difficulty. MRI showed left MCA embolic stroke. EF 55-60%. Negative DVT. LDL 124 and A1C 5.3. Cardiology recommended outpt TEE and 30 day cardiac event monitoring. If monitoring negative, will consider loop recorder. He was discharged with ASA and lipitor without neuro deficit.  3/20/18DR. Xu follow up - the patient has been doing well. No recurrent stroke symptoms. Had 30 day cardiac event monitoring did not show afib. Had outpt TEE which was unremarkable. He was scheduled with Dr. Caryl Comes for loop recorder. Pt still feels intermittent palpitation. Cut down smoking but not quit yet.  Interval History7/23/18 Dr. Erlinda Hong During the interval time, pt has been doing well from stroke standpoint. He follows with Dr. [name] in [facility] and repeat TTE with EF 50%. Bp goal 120/80 due to hx of aortic dissection s/p repair. He continues on Keppra and no more seizure episode. Back to driving earlier this month. Has not quit smoking yet. BP still high 162/91. He said his BP at home similar to this. He had loop recorder with Dr. Caryl Comes and currently no Afib.  UPDATE 1/23/2019CM [name], 48 year old male returns for follow-up for stroke event in December 2017.  He has been doing well from a stroke standpoint.  He had aortic  aneurysm repair at [facility] in October.  He remains on aspirin and Lipitor for secondary stroke prevention.  He has no bruising and no bleeding.  In addition he denies myalgias.  Blood pressure in the office today 141/80.  He continues to have adjustments in his medication by Dr. Harlow Mares.  He has another appointment tomorrow.  He remains on Keppra 500 twice daily for seizure  prevention without further seizure activity.  He denies side effects to the medication.  He complains of intermittent visual difficulty particularly blurring of vision.  He says he has never had an eye exam.  Loop recorder so far no atrial fibrillation.  Continues to smoke and was encouraged to quit.  Complains of daytime drowsiness memory loss and snoring and depression.  He has never had a sleep study.  He is also trying to get disability.  He returns for reevaluation UPDATE 7/23/2019CM [name], 48 year old male returns for follow-up with history of stroke event in December 2017.  He also had an isolated seizure event at that time and  cardiac arrest.  He remains on aspirin for secondary stroke prevention without bruising or bleeding and no further stroke or TIA symptoms.  In addition he is on Lipitor without myalgias.  Blood pressure continues to be elevated at 180/90.  He is compliant with his for blood pressure medications he claims.  He was advised to see Dr. Harlow Mares who manages his hypertension.  He is off Keppra now without further seizure events.  He recently got glasses but unfortunately they fell off the car and were broken.  Loop recorder so far no atrial  fibrillation.  Continues to smoke 2 cigarettes a day was encouraged to quit.  Sleep study after last visit was negative for obstructive sleep apnea.  He returns for reevaluation  10/10/2018 visit: [name] is a 48 year old male who is being seen today for follow-up regarding stroke in 02/2016.  He has been stable from a neurological standpoint but has been hospitalized x2 with  most recent 09/2018 with NSTEMI and continues to follow with cardiology regularly.  He continues on aspirin and clopidogrel 75 mg for secondary stroke prevention underlying cardiac history without bleeding or bruising.  He also continues on atorvastatin without myalgias.  Blood pressure ***.  He denies any recurrent seizure activity and remains off Keppra.  Loop recorder is not shown atrial fibrillation thus far.    REVIEW OF SYSTEMS: Full 14 system review of systems performed and notable only for those listed, all others are neg:  Constitutional: neg  Cardiovascular: neg Ear/Nose/Throat: Hearing loss Skin: neg Eyes: Blurry vision Respiratory: neg Gastroitestinal: neg  Hematology/Lymphatic: neg  Endocrine: Intolerance to heat Musculoskeletal:neg Allergy/Immunology: neg Neurological: Memory loss, history of isolated seizure  Psychiatric: Depression  Sleep : neg  ALLERGIES: Allergies  Allergen Reactions  . Imdur [Isosorbide Nitrate] Other (See Comments)    Headaches   . Tramadol Other (See Comments)    Headaches     HOME MEDICATIONS: Outpatient Medications Prior to Visit  Medication Sig Dispense Refill  . acetaminophen (TYLENOL) 325 MG tablet Take 650 mg by mouth every 6 (six) hours as needed (for pain or headaches).     [name] amLODipine (NORVASC) 10 MG tablet Take 1 tablet (10 mg total) by mouth daily. 30 tablet 0  . aspirin EC 81 MG EC tablet Take 1 tablet (81 mg total) by mouth daily. 90 tablet 3  . carvedilol (COREG) 25 MG tablet Take 1 tablet (25 mg total) by mouth 2 (two) times daily with a meal. 60 tablet 0  . cloNIDine (CATAPRES - DOSED IN MG/24 HR) 0.3 mg/24hr patch Place 1 patch (0.3 mg total) onto the skin once a week. 4 patch 6  . clopidogrel (PLAVIX) 75 MG tablet Take 1 tablet (75 mg total) by mouth daily with breakfast. 90 tablet 3  . Evolocumab (REPATHA SURECLICK) 400 MG/ML SOAJ Inject 1 Dose into the skin every 14 (fourteen) days. 2 pen 11  . ezetimibe (ZETIA) 10 MG  tablet Take 1 tablet (10 mg total) by mouth daily. 90 tablet 3  . furosemide (LASIX) 40 MG tablet Take one tablet daily as needed for shortness of breath or weight gain of more than 3 lbs in one day. (Patient taking differently: Take 40 mg by mouth daily as needed (shortness of breath or weight gain of more than 3 lbs in one day). ) 30 tablet 1  . hydrALAZINE (APRESOLINE) 100 MG tablet Take 1 tablet (100 mg total) by mouth every 8 (eight) hours. 90 tablet 0  . isosorbide mononitrate (IMDUR) 60 MG 24 hr tablet Take 1 tablet (60 mg total) by mouth daily. 30 tablet 6  . pantoprazole (PROTONIX) 40 MG tablet Take 1 tablet (40 mg total) by mouth daily. 30 tablet 11  . rosuvastatin (CRESTOR) 40 MG tablet Take 1 tablet (40 mg total) by mouth daily. 90 tablet 3  . traZODone (DESYREL) 50 MG tablet Take 0.5-1 tablets (25-50 mg total) by mouth at bedtime as needed for sleep. 30 tablet 3   No facility-administered medications prior to visit.     PAST MEDICAL HISTORY: Past Medical  History:  Diagnosis Date  . Adenomatous colon polyp 08/2015  . Anxiety   . Aortic disease ([place])   . Aortic dissection ([facility])    a. admx 04/2014 >> L renal infarct; a/c renal failure >> b.  s/p Bioprosthetic [place] and total arch replacement and staged endovascular repair of descending aortic aneurysm ([facility] - Dr. [name])  . CAD (coronary artery disease)    a. LHC 4/16:  oD1 60%  . Cardiomyopathy ([place])    a. non-ischemic - probably related to untreated HTN and ETOH abuse - Echo 3/13 with EF 35-40% >> b. Echo 4/16: Severe LVH, EF 55-60%, moderate AI, moderate MR, mild LAE, trivial effusion, known type B dissection with communication between true and false lumens with suprasternal images suggesting dissection plane may propagate to at least left subclavian takeoff, root above aortic valve okay    . Chronic abdominal pain   . Chronic combined systolic and diastolic congestive heart failure ([place])    a. 05/2011: Adm with pulm edema/HTN  urgency, EF 35-40% with diffuse hypokinesis and moderate to severe mitral regurgitation. Cardiomyopathy likely due to uncontrolled HTN and ETOH abuse - cath deferred due to renal insufficiency (felt due to uncontrolled HTN). bJodie Echevaria MV 06/2011: EF 37% and no ischemia or infarction. c. EF 45-50% by echo 01/2012.  [name] Chronic sinusitis   . CKD (chronic kidney disease)    a. 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  peak creatinine 3.46 during admx for aortic dissection 2/16.  sees Dr [name]  . DDD (degenerative disc disease), lumbar   . Depression   . Descending thoracic aortic aneurysm ([place])   . Dissecting aneurysm of thoracic aorta ([place])   . ETOH abuse    a. Reported to have quit 05/2011.  . Frequent headaches   . GERD (gastroesophageal reflux disease)   . Headache(784.0)    "q other day" (08/08/2013)  . Heart murmur   . Hemorrhoid thrombosis   . History of echocardiogram    Echo 1/17:  Severe LVH, EF 55-60%, no RWMA, Gr 2 DD, AVR ok, mild to mod MR, mild LAE, mild reduced RVSF, mod RAE  . History of medication noncompliance   . HYPERLIPIDEMIA   . Hypertension    a. Hx of HTN urgency secondary to noncompliance. b. urinary metanephrine and catecholeamine levels normal 2013.  c. Renal art [name] 1/16:  No evidence of renal artery stenosis noted bilaterally.  . INGUINAL HERNIA   . Pneumonia ~ 2013  . Renal insufficiency   . Serrated adenoma of colon 08/2015  . Stroke ([place])   . Tobacco abuse   . Valvular heart disease    a. Echo 05/2011: moderate to severe eccentric MR and mild to moderate AI with prolapsing left coronary cusp. b. Echo 01/2012: mild-mod AI, mild dilitation of aortic root, mild MR.;  c. Echo 1/16: Severe LVH consistent with hypertrophic cardio myopathy, EF 50%, no RWMA, mod AI, mild MR, mild RAE, dilated Ao root (40 mm);     PAST SURGICAL HISTORY: Past Surgical History:  Procedure Laterality Date  . ANKLE SURGERY Bilateral    Fractures bilaterally  . AORTIC VALVE SURGERY  09/2014  .  CORONARY ANGIOGRAPHY N/A 12/06/2017   Procedure: CORONARY ANGIOGRAPHY;  Surgeon: Belva Crome, MD;  Location: [place] [facility];  Service: Cardiovascular;  Laterality: N/A;  . CORONARY ANGIOGRAPHY N/A 09/26/2018   Procedure: CORONARY ANGIOGRAPHY;  Surgeon: Nelva Bush, MD;  Location: [place] [facility];  Service: Cardiovascular;  Laterality: N/A;  .  CORONARY STENT INTERVENTION N/A 12/10/2017   Procedure: CORONARY STENT INTERVENTION;  Surgeon: [name] Sine, MD;  Location: [place] [facility];  Service: Cardiovascular;  Laterality: N/A;  . FOOT FRACTURE SURGERY Bilateral 2004-2010   "got pins in both of them"  . HEMORRHOID SURGERY N/A 06/15/2015   Procedure: HEMORRHOIDECTOMY;  Surgeon: Stark Klein, MD;  Location: [place];  Service: General;  Laterality: N/A;  . INGUINAL HERNIA REPAIR Right ~ 1996  . LEFT HEART CATHETERIZATION WITH CORONARY ANGIOGRAM N/A 06/21/2014   Procedure: LEFT HEART CATHETERIZATION WITH CORONARY ANGIOGRAM;  Surgeon: Larey Dresser, MD;  Location: [facility] C[facility] LAB;  Service: Cardiovascular;  Laterality: N/A;  . LOOP RECORDER INSERTION N/A 06/19/2016   Procedure: Loop Recorder Insertion;  Surgeon: Deboraha Sprang, MD;  Location: [place] [facility];  Service: Cardiovascular;  Laterality: N/A;  . TEE WITHOUT CARDIOVERSION N/A 03/12/2016   Procedure: TRANSESOPHAGEAL ECHOCARDIOGRAM (TEE);  Surgeon: Sanda Klein, MD;  Location: [facility] ENDOSCOPY;  Service: Cardiovascular;  Laterality: N/A;    FAMILY HISTORY: Family History  Problem Relation Age of Onset  . Hypertension Mother   . Hypertension Other   . Colon cancer Paternal Uncle   . Stroke Maternal Aunt   . Heart attack Brother   . Diabetes Maternal Aunt   . Lung cancer Maternal Uncle   . Other Sister        "breathing machine at night"  . Headache Sister   . Thyroid disease Sister   . Throat cancer Neg Hx   . Pancreatic cancer Neg Hx   . Esophageal cancer Neg Hx   . Kidney disease Neg Hx   . Liver disease Neg Hx      SOCIAL HISTORY: Social History   Socioeconomic History  . Marital status: Married    Spouse name: Not on file  . Number of children: 5  . Years of education: 74  . Highest education level: Not on file  Occupational History  . Occupation: Disabled, part-time Programmer, systems    Comment: Disability  Social Needs  . Financial resource strain: Not hard at all  . Food insecurity    Worry: Never true    Inability: Never true  . Transportation needs    Medical: No    Non-medical: No  Tobacco Use  . Smoking status: Current Some Day Smoker    Packs/day: 0.25    Years: 23.00    Pack years: 5.75    Types: Cigarettes  . Smokeless tobacco: Never Used  . Tobacco comment: 3 to 4 per day  Substance and Sexual Activity  . Alcohol use: Yes    Alcohol/week: 1.0 standard drinks    Types: 1 Cans of beer per week    Comment: occasionally  . Drug use: Yes    Types: Marijuana    Comment: 2 times/week  . Sexual activity: Yes  Lifestyle  . Physical activity    Days per week: Not on file    Minutes per session: Not on file  . Stress: Not on file  Relationships  . Social Herbalist on phone: Not on file    Gets together: Not on file    Attends religious service: Not on file    Active member of club or organization: Not on file    Attends meetings of clubs or organizations: Not on file    Relationship status: Not on file  . Intimate partner violence    Fear of current or ex partner: Not on  file    Emotionally abused: Not on file    Physically abused: Not on file    Forced sexual activity: Not on file  Other Topics Concern  . Not on file  Social History Narrative   Fun: Enjoy his children     PHYSICAL EXAM  There were no vitals filed for this visit. There is no height or weight on file to calculate BMI.  Generalized: Well developed, in no acute distress  Head: normocephalic and atraumatic,. Oropharynx benign  Neck: Supple, no carotid bruits  Cardiac: Regular rate  rhythm, murmur audible Musculoskeletal: No deformity   Neurological examination   Mentation: Alert oriented to time, place, history taking. Attention span and concentration appropriate. Recent and remote memory intact.  Follows all commands speech and language fluent.   Cranial nerve II-XII: Pupils were equal round reactive to light extraocular movements were full, visual field were full on confrontational test. Facial sensation and strength were normal. hearing was intact to finger rubbing bilaterally. Uvula tongue midline. head turning and shoulder shrug were normal and symmetric.Tongue protrusion into cheek strength was normal. Motor: normal bulk and tone, full strength in the BUE, BLE, fine finger movements normal, no pronator drift. No focal weakness Sensory: normal and symmetric to light touch, pinprick, and  Vibration,  Coordination: finger-nose-finger, heel-to-shin bilaterally, no dysmetria, no tremor Reflexes: 1+ upper lower and symmetric plantar responses were flexor bilaterally. Gait and Station: Rising up from seated position without assistance, normal stance,  moderate stride, good arm swing, smooth turning, able to perform tiptoe, and heel walking without difficulty. Tandem gait is mildly unsteady.   DIAGNOSTIC DATA (LABS, IMAGING, TESTING) - I reviewed patient records, labs, notes, testing and imaging myself where available.  Lab Results  Component Value Date   WBC 4.6 09/27/2018   HGB 13.1 09/27/2018   HCT 39.5 09/27/2018   MCV 95.2 09/27/2018   PLT 132 (L) 09/27/2018      Component Value Date/Time   NA 137 09/28/2018 0247   NA 143 12/23/2017 1539   K 3.7 09/28/2018 0247   CL 105 09/28/2018 0247   CO2 23 09/28/2018 0247   GLUCOSE 92 09/28/2018 0247   BUN 21 (H) 09/28/2018 0247   BUN 33 (H) 12/23/2017 1539   CREATININE 2.22 (H) 09/28/2018 0247   CREATININE 1.52 (H) 06/08/2014 1132   CALCIUM 9.1 09/28/2018 0247   PROT 6.6 12/02/2017 1457   PROT 7.2 06/22/2017 1220    ALBUMIN 3.4 (L) 12/02/2017 1457   ALBUMIN 4.1 06/22/2017 1220   AST 18 12/02/2017 1457   ALT 10 12/02/2017 1457   ALKPHOS 83 12/02/2017 1457   BILITOT 0.8 12/02/2017 1457   BILITOT 0.2 06/22/2017 1220   GFRNONAA 34 (L) 09/28/2018 0247   GFRAA 40 (L) 09/28/2018 0247   Lab Results  Component Value Date   CHOL 165 07/27/2018   HDL 53 07/27/2018   LDLCALC 91 07/27/2018   TRIG 107 07/27/2018   CHOLHDL 3.1 07/27/2018   Lab Results  Component Value Date   HGBA1C 5.3 03/04/2016      ASSESSMENT AND PLAN 48 y.o. African American male with PMH of aortic dissection s/p repair and reconstruction, systolic and diastolic heart failure, and HTN admitted on 02/28/16 for confusion and right-sided weakness with stroke work-up revealing left MCA embolic infarct. In ED, had seizure and subsequent cardiac arrest.  He had loop recorder placed with Dr. Caryl Comes which has not shown atrial fibrillation thus far.    Cut down smoking but not  quit yet. No more seizure now and back to driving.  He has been titrated off of Keppra.  BP not in good control, goal 120/80 due to hx of aortic dissection s/p repair.   Sleep study negative for obstructive sleep apnea. The patient is a current patient of Dr. Erlinda Hong  who has left our practice.  This note is sent to the work in doctor.      Plan: Stressed the importance of management of risk factors to prevent further stroke Continue aspirin and Lipitor for secondary stroke prevention Stop smoking, down to 2 daily Loop recorder so far no atrial fibrillation Check BP at home and record and bring over or call Dr. Harlow Mares for medication adjustment. BP goal 120/80 due to hx of aortic dissection s/p repair today's reading 180/90  continue antihypertensive medications(4) Follow up with your primary care physician for stroke risk factor modification. Recommend maintain blood pressure goal 120/80, diabetes with hemoglobin A1c goal below 7.0% and lipids with LDL cholesterol goal below  70 mg/dL.   exercise by walking,   eat healthy diet with whole grains,  fresh fruits and vegetables Continue follow up with Dr. [name] in [place] Please maintain seizure precautions,pt has tapered off Keppra sleep study no OSA  Follow-up in 1 year if continued stable will discharge   Venancio Poisson, AGNP-BC  [facility] [address] [place], [place] [address]  Phone [phone] Fax [phone] Note: This document was prepared with digital dictation and possible smart phrase technology. Any transcriptional errors that result from this process are unintentional.

## 2018-10-11 ENCOUNTER — Encounter: Payer: Self-pay | Admitting: Adult Health

## 2018-10-25 ENCOUNTER — Encounter: Payer: Self-pay | Admitting: Adult Health

## 2018-10-25 ENCOUNTER — Ambulatory Visit (INDEPENDENT_AMBULATORY_CARE_PROVIDER_SITE_OTHER): Payer: Medicare HMO | Admitting: Adult Health

## 2018-10-25 ENCOUNTER — Other Ambulatory Visit: Payer: Self-pay

## 2018-10-25 VITALS — BP 180/98 | HR 81 | Temp 96.9°F | Ht 72.0 in | Wt 154.2 lb

## 2018-10-25 DIAGNOSIS — G4452 New daily persistent headache (NDPH): Secondary | ICD-10-CM | POA: Diagnosis not present

## 2018-10-25 DIAGNOSIS — Z8679 Personal history of other diseases of the circulatory system: Secondary | ICD-10-CM

## 2018-10-25 DIAGNOSIS — I10 Essential (primary) hypertension: Secondary | ICD-10-CM

## 2018-10-25 DIAGNOSIS — E785 Hyperlipidemia, unspecified: Secondary | ICD-10-CM

## 2018-10-25 DIAGNOSIS — Z8673 Personal history of transient ischemic attack (TIA), and cerebral infarction without residual deficits: Secondary | ICD-10-CM

## 2018-10-25 DIAGNOSIS — R413 Other amnesia: Secondary | ICD-10-CM

## 2018-10-25 DIAGNOSIS — L720 Epidermal cyst: Secondary | ICD-10-CM

## 2018-10-25 DIAGNOSIS — R202 Paresthesia of skin: Secondary | ICD-10-CM

## 2018-10-25 DIAGNOSIS — M5481 Occipital neuralgia: Secondary | ICD-10-CM

## 2018-10-25 MED ORDER — AMITRIPTYLINE HCL 25 MG PO TABS: 25.0000 mg | ORAL_TABLET | Freq: Every day | ORAL | 3 refills | Status: DC

## 2018-10-25 NOTE — Progress Notes (Signed)
[facility]  PATIENT: [name] DOB: May 31, 1970   REASON FOR VISIT: Follow-up for stroke 02/2016 HISTORY FROM: Patient  Chief Complaint  Patient presents with  . Follow-up    treatment room , alone. Stroke/uncontrolled hypertension. "right side is weaker, forgetfull"     HISTORY OF PRESENT ILLNESS  Andre J [name] a 48 y.o.malewith history of aortic dissection s/p repair and reconstruction, systolic and diastolic heart failure, and HTN admitted on 02/28/16 for confusion and right-sided weakness. In ED, had seizure and subsequent cardiac arrest. CT negative for bleeding. CTA without clear LVO, but moderate narrowing of left M2 and right P2. Hewas not considered for IV TPA due to seizure at onset with non-stroke presentation. Intubated and started on Keppra. Post extubation had mild right hemiparesis and word finding difficulty. MRI showed left MCA embolic stroke. EF 55-60%. Negative DVT. LDL 124 and A1C 5.3. Cardiology recommended outpt TEE and 30 day cardiac event monitoring. If monitoring negative, will consider loop recorder. He was discharged with ASA and lipitor without neuro deficit. Underwent TEE on 03/12/2016 which was unremarkable therefore loop recorder placed to assess for atrial fibrillation. Underwent aortic aneurysm repair in 44/9675 without complications Underwent sleep study in 05/2017 which did not show evidence of sleep apnea Discontinue Keppra mid 2019 due to absence of seizures   10/25/2018 update: [name] is a 48 year old male who is being seen today for follow-up regarding stroke in 02/2016.  Residual stroke deficits of right-sided paresthesias with decreased sensory and subjective weakness.  The symptoms have been stable without any worsening but does feel as though memory/cognition has been worsening since his stroke.  He has difficulty remembering specific dates, conversations that recently took place and will have difficulty at times  remembering where he is out while driving despite being in no locations.  He also endorses delayed recall.  Paresthesias are worse right lower extremity distally and are typically worse at night which has been interfering with his sleep.  He has trialed gabapentin in the past without benefit.  He also endorses headaches located left-sided head along occipital nerve branch with an electrical shock type of sensation.  He has trialed Tylenol in the past with mild benefit.  Since prior visit,he has been hospitalized x2 with most recent 09/2018 with NSTEMI and continues to follow with cardiology regularly.  He continues on aspirin and clopidogrel 75 mg for secondary stroke prevention and underlying cardiac history without bleeding or bruising.  He was referred to lipid clinic for ongoing difficulty treating LDL despite max tolerated medical therapy.  Recommend initiating [place] and repeat labs in 3 to 4 months.  Blood pressure elevated at 180/98 which typically is elevated at visit but also endorses ongoing difficulty with management.  He denies any recurrent seizure activity and remains off Keppra.  Loop recorder is not shown atrial fibrillation thus far.  No further concerns at this time.    REVIEW OF SYSTEMS: Full 14 system review of systems performed and notable only for those listed, all others are neg:  Constitutional: neg  Cardiovascular: neg Ear/Nose/Throat: Headache Skin: Numbness/tingling Eyes: Blurry vision Respiratory: neg Gastroitestinal: neg  Hematology/Lymphatic: neg  Endocrine: Intolerance to heat Musculoskeletal:neg Allergy/Immunology: neg Neurological: Memory loss Psychiatric: Depression  Sleep : neg  ALLERGIES: Allergies  Allergen Reactions  . Imdur [Isosorbide Nitrate] Other (See Comments)    Headaches   . Tramadol Other (See Comments)    Headaches     HOME MEDICATIONS: Outpatient Medications Prior to Visit  Medication Sig Dispense Refill  . acetaminophen (TYLENOL) 325  MG tablet Take 650 mg by mouth every 6 (six) hours as needed (for pain or headaches).     [name] amLODipine (NORVASC) 10 MG tablet Take 1 tablet (10 mg total) by mouth daily. 30 tablet 0  . aspirin EC 81 MG EC tablet Take 1 tablet (81 mg total) by mouth daily. 90 tablet 3  . carvedilol (COREG) 25 MG tablet Take 1 tablet (25 mg total) by mouth 2 (two) times daily with a meal. 60 tablet 0  . cloNIDine (CATAPRES - DOSED IN MG/24 HR) 0.3 mg/24hr patch Place 1 patch (0.3 mg total) onto the skin once a week. 4 patch 6  . clopidogrel (PLAVIX) 75 MG tablet Take 1 tablet (75 mg total) by mouth daily with breakfast. 90 tablet 3  . Evolocumab (REPATHA SURECLICK) 811 MG/ML SOAJ Inject 1 Dose into the skin every 14 (fourteen) days. 2 pen 11  . ezetimibe (ZETIA) 10 MG tablet Take 1 tablet (10 mg total) by mouth daily. 90 tablet 3  . furosemide (LASIX) 40 MG tablet Take one tablet daily as needed for shortness of breath or weight gain of more than 3 lbs in one day. (Patient taking differently: Take 40 mg by mouth daily as needed (shortness of breath or weight gain of more than 3 lbs in one day). ) 30 tablet 1  . hydrALAZINE (APRESOLINE) 100 MG tablet Take 1 tablet (100 mg total) by mouth every 8 (eight) hours. 90 tablet 0  . isosorbide mononitrate (IMDUR) 60 MG 24 hr tablet Take 1 tablet (60 mg total) by mouth daily. (Patient taking differently: Take 60 mg by mouth daily as needed. ) 30 tablet 6  . pantoprazole (PROTONIX) 40 MG tablet Take 1 tablet (40 mg total) by mouth daily. 30 tablet 11  . rosuvastatin (CRESTOR) 40 MG tablet Take 1 tablet (40 mg total) by mouth daily. 90 tablet 3  . traZODone (DESYREL) 50 MG tablet Take 0.5-1 tablets (25-50 mg total) by mouth at bedtime as needed for sleep. 30 tablet 3   No facility-administered medications prior to visit.     PAST MEDICAL HISTORY: Past Medical History:  Diagnosis Date  . Adenomatous colon polyp 08/2015  . Anxiety   . Aortic disease ([place])   . Aortic  dissection (HCC)    a. admx 04/2014 >> L renal infarct; a/c renal failure >> b.  s/p Bioprosthetic [place] and total arch replacement and staged endovascular repair of descending aortic aneurysm ([facility] - Dr. [name])  . CAD (coronary artery disease)    a. LHC 4/16:  oD1 60%  . Cardiomyopathy ([place])    a. non-ischemic - probably related to untreated HTN and ETOH abuse - Echo 3/13 with EF 35-40% >> b. Echo 4/16: Severe LVH, EF 55-60%, moderate AI, moderate MR, mild LAE, trivial effusion, known type B dissection with communication between true and false lumens with suprasternal images suggesting dissection plane may propagate to at least left subclavian takeoff, root above aortic valve okay    . Chronic abdominal pain   . Chronic combined systolic and diastolic congestive heart failure ([place])    a. 05/2011: Adm with pulm edema/HTN urgency, EF 35-40% with diffuse hypokinesis and moderate to severe mitral regurgitation. Cardiomyopathy likely due to uncontrolled HTN and ETOH abuse - cath deferred due to renal insufficiency (felt due to uncontrolled HTN). bJodie Echevaria MV 06/2011: EF 37% and no ischemia or infarction. c. EF 45-50% by echo 01/2012.  [name]  Chronic sinusitis   . CKD (chronic kidney disease)    a. 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  peak creatinine 3.46 during admx for aortic dissection 2/16.  sees Dr [name]  . DDD (degenerative disc disease), lumbar   . Depression   . Descending thoracic aortic aneurysm ([place])   . Dissecting aneurysm of thoracic aorta ([place])   . ETOH abuse    a. Reported to have quit 05/2011.  . Frequent headaches   . GERD (gastroesophageal reflux disease)   . Headache(784.0)    "q other day" (08/08/2013)  . Heart murmur   . Hemorrhoid thrombosis   . History of echocardiogram    Echo 1/17:  Severe LVH, EF 55-60%, no RWMA, Gr 2 DD, AVR ok, mild to mod MR, mild LAE, mild reduced RVSF, mod RAE  . History of medication noncompliance   . HYPERLIPIDEMIA   . Hypertension    a. Hx of HTN  urgency secondary to noncompliance. b. urinary metanephrine and catecholeamine levels normal 2013.  c. Renal art [name] 1/16:  No evidence of renal artery stenosis noted bilaterally.  . INGUINAL HERNIA   . Pneumonia ~ 2013  . Renal insufficiency   . Serrated adenoma of colon 08/2015  . Stroke ([place])   . Tobacco abuse   . Valvular heart disease    a. Echo 05/2011: moderate to severe eccentric MR and mild to moderate AI with prolapsing left coronary cusp. b. Echo 01/2012: mild-mod AI, mild dilitation of aortic root, mild MR.;  c. Echo 1/16: Severe LVH consistent with hypertrophic cardio myopathy, EF 50%, no RWMA, mod AI, mild MR, mild RAE, dilated Ao root (40 mm);     PAST SURGICAL HISTORY: Past Surgical History:  Procedure Laterality Date  . ANKLE SURGERY Bilateral    Fractures bilaterally  . AORTIC VALVE SURGERY  09/2014  . CORONARY ANGIOGRAPHY N/A 12/06/2017   Procedure: CORONARY ANGIOGRAPHY;  Surgeon: Belva Crome, MD;  Location: [facility];  Service: Cardiovascular;  Laterality: N/A;  . CORONARY ANGIOGRAPHY N/A 09/26/2018   Procedure: CORONARY ANGIOGRAPHY;  Surgeon: Nelva Bush, MD;  Location: [facility];  Service: Cardiovascular;  Laterality: N/A;  . CORONARY STENT INTERVENTION N/A 12/10/2017   Procedure: CORONARY STENT INTERVENTION;  Surgeon: [name] Sine, MD;  Location: [facility];  Service: Cardiovascular;  Laterality: N/A;  . FOOT FRACTURE SURGERY Bilateral 2004-2010   "got pins in both of them"  . HEMORRHOID SURGERY N/A 06/15/2015   Procedure: HEMORRHOIDECTOMY;  Surgeon: Stark Klein, MD;  Location: [facility];  Service: General;  Laterality: N/A;  . INGUINAL HERNIA REPAIR Right ~ 1996  . LEFT HEART CATHETERIZATION WITH CORONARY ANGIOGRAM N/A 06/21/2014   Procedure: LEFT HEART CATHETERIZATION WITH CORONARY ANGIOGRAM;  Surgeon: Larey Dresser, MD;  Location: [facility] LAB;  Service: Cardiovascular;  Laterality: N/A;  . LOOP RECORDER INSERTION N/A 06/19/2016    Procedure: Loop Recorder Insertion;  Surgeon: Deboraha Sprang, MD;  Location: [facility];  Service: Cardiovascular;  Laterality: N/A;  . TEE WITHOUT CARDIOVERSION N/A 03/12/2016   Procedure: TRANSESOPHAGEAL ECHOCARDIOGRAM (TEE);  Surgeon: Sanda Klein, MD;  Location: [facility];  Service: Cardiovascular;  Laterality: N/A;    FAMILY HISTORY: Family History  Problem Relation Age of Onset  . Hypertension Mother   . Hypertension Other   . Colon cancer Paternal Uncle   . Stroke Maternal Aunt   . Heart attack Brother   . Diabetes Maternal Aunt   . Lung cancer Maternal  Uncle   . Other Sister        "breathing machine at night"  . Headache Sister   . Thyroid disease Sister   . Throat cancer Neg Hx   . Pancreatic cancer Neg Hx   . Esophageal cancer Neg Hx   . Kidney disease Neg Hx   . Liver disease Neg Hx     SOCIAL HISTORY: Social History   Socioeconomic History  . Marital status: Married    Spouse name: Not on file  . Number of children: 5  . Years of education: 16  . Highest education level: Not on file  Occupational History  . Occupation: Disabled, part-time Programmer, systems    Comment: Disability  Social Needs  . Financial resource strain: Not hard at all  . Food insecurity    Worry: Never true    Inability: Never true  . Transportation needs    Medical: No    Non-medical: No  Tobacco Use  . Smoking status: Current Some Day Smoker    Packs/day: 0.25    Years: 23.00    Pack years: 5.75    Types: Cigarettes  . Smokeless tobacco: Never Used  . Tobacco comment: 3 to 4 per day  Substance and Sexual Activity  . Alcohol use: Yes    Alcohol/week: 1.0 standard drinks    Types: 1 Cans of beer per week    Comment: occasionally  . Drug use: Yes    Types: Marijuana    Comment: 2 times/week  . Sexual activity: Yes  Lifestyle  . Physical activity    Days per week: Not on file    Minutes per session: Not on file  . Stress: Not on file  Relationships  .  Social Herbalist on phone: Not on file    Gets together: Not on file    Attends religious service: Not on file    Active member of club or organization: Not on file    Attends meetings of clubs or organizations: Not on file    Relationship status: Not on file  . Intimate partner violence    Fear of current or ex partner: Not on file    Emotionally abused: Not on file    Physically abused: Not on file    Forced sexual activity: Not on file  Other Topics Concern  . Not on file  Social History Narrative   Fun: Enjoy his children     PHYSICAL EXAM  Vitals:   10/25/18 1343  BP: (!) 180/98  Pulse: 81  Temp: (!) 96.9 F (36.1 C)  Weight: 154 lb 3.2 oz (69.9 kg)  Height: 6' (1.829 m)   Body mass index is 20.91 kg/m.  Generalized: Well developed, pleasant middle-aged African-American male, in no acute distress  Head: normocephalic and atraumatic,. 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 Attention span and concentration appropriate. Recent and remote memory intact.  Follows all commands speech and language fluent.   Cranial nerve II-XII: Pupils were equal round reactive to light extraocular movements were full, visual field were full on confrontational test. Facial sensation and strength were normal. hearing was intact to finger rubbing bilaterally. Uvula tongue midline. head turning and shoulder shrug were normal and symmetric.Tongue protrusion into cheek strength was normal. Motor: normal bulk and  tone, full strength in the BUE, BLE, decreased right hand finger dexterity, no pronator drift. No focal weakness Sensory: Intact to light touch throughout all extremities.  Decreased pinprick, vibration and temperature sensation in right upper and lower  extremity compared to left upper and lower extremity Coordination: finger-nose-finger, heel-to-shin bilaterally, decreased finger tapping right hand Reflexes: 1+ upper lower and symmetric plantar responses were flexor bilaterally. Gait and Station: Rising up from seated position without assistance, normal stance,  moderate  able to perform tiptoe, and heel walking without difficulty.      DIAGNOSTIC DATA (LABS, IMAGING, TESTING) - I reviewed patient records, labs, notes, testing and imaging myself where available.  Lab Results  Component Value Date   WBC 4.6 09/27/2018   HGB 13.1 09/27/2018   HCT 39.5 09/27/2018   MCV 95.2 09/27/2018   PLT 132 (L) 09/27/2018      Component Value Date/Time   NA 137 09/28/2018 0247   NA 143 12/23/2017 1539   K 3.7 09/28/2018 0247   CL 105 09/28/2018 0247   CO2 23 09/28/2018 0247   GLUCOSE 92 09/28/2018 0247   BUN 21 (H) 09/28/2018 0247   BUN 33 (H) 12/23/2017 1539   CREATININE 2.22 (H) 09/28/2018 0247   CREATININE 1.52 (H) 06/08/2014 1132   CALCIUM 9.1 09/28/2018 0247   PROT 6.6 12/02/2017 1457   PROT 7.2 06/22/2017 1220   ALBUMIN 3.4 (L) 12/02/2017 1457   ALBUMIN 4.1 06/22/2017 1220   AST 18 12/02/2017 1457   ALT 10 12/02/2017 1457   ALKPHOS 83 12/02/2017 1457   BILITOT 0.8 12/02/2017 1457   BILITOT 0.2 06/22/2017 1220   GFRNONAA 34 (L) 09/28/2018 0247   GFRAA 40 (L) 09/28/2018 0247   Lab Results  Component Value Date   CHOL 165 07/27/2018   HDL 53 07/27/2018   LDLCALC 91 07/27/2018   TRIG 107 07/27/2018   CHOLHDL 3.1 07/27/2018   Lab Results  Component Value Date   HGBA1C 5.3 03/04/2016      ASSESSMENT AND PLAN 48 y.o. African American male with PMH of aortic dissection s/p repair and reconstruction, systolic and diastolic heart failure, and HTN admitted on 02/28/16 for confusion and right-sided weakness with stroke work-up revealing left MCA embolic infarct. In ED, had seizure and subsequent cardiac arrest.  He had loop  recorder placed with Dr. Caryl Comes which has not shown atrial fibrillation thus far.  Residual stroke deficits of subjective right hemiparesis (unable to appreciate on exam), paresthesias, decreased sensory and memory loss.  He feels as though memory loss has been worsening and has also been experiencing new onset headaches.    Plan:  -Will obtain MRI wo contrast due to new onset headaches and worsening memory loss.  Unable to perform imaging with contrast due to kidney function.-possibly related to occipital neuralgia  -Referral to dermatology for possible removal of questionable pilar cyst which could be pressing on occipital nerve causing symptoms -Initiate amitriptyline 25 mg nightly due to paresthesias and headaches -Referral placed to physical therapy for possible improvement of paresthesias -Referral placed to neuropsychology for further evaluation regarding cognitive/memory loss complaints with possible need of neurocognitive evaluation -Continue aspirin and Lipitor for secondary stroke prevention -Continue to monitor loop recorder for atrial fibrillation -Advised to follow-up with PCP/cardiology in regards to elevated blood pressure and need for blood pressure management with history of aortic dissection repair -Follow up with your primary care physician for stroke risk factor modification. Recommend maintain blood pressure goal 120/80, diabetes with hemoglobin A1c  goal below 7.0% and lipids with LDL cholesterol goal below 70 mg/dL.   exercise by walking,   Follow-up in 3 months or call earlier if needed  Greater than 50% of this 40-minute visit was spent discussing residual deficits with questionable worsening, memory concerns and daily headaches.  Answered all questions to patient satisfaction along with reviewing further treatment plan and interventions.  Frann Rider, AGNP-BC  [facility] [address] [place], [place] [address]  Phone [phone]  Fax [phone] Note: This document was prepared with digital dictation and possible smart phrase technology. Any transcriptional errors that result from this process are unintentional.

## 2018-10-25 NOTE — Patient Instructions (Signed)
We will repeat MRI  due to worsening symptoms of memory, headaches, and right sided complaints and to further view cyst to see if this is pressing on nerve root  Referral placed to dermatology for further evaluation and potential removal of cyst as this could be pressing on your nerves causing pain  Referral placed to physical therapy for potential improvement of nerve pain  Referral placed for neuropsych eval with likely need of neurocognitive testing  We will start amitriptyline 25mg  nightly for nerve pain and headaches  Continue aspirin 81 mg daily and clopidogrel 75 mg daily  and Lipitor for secondary stroke prevention  Continue to follow up with PCP regarding cholesterol and blood pressure management   Continue to monitor blood pressure at home  Maintain strict control of hypertension with blood pressure goal below 130/90, diabetes with hemoglobin A1c goal below 6.5% and cholesterol with LDL cholesterol (bad cholesterol) goal below 70 mg/dL. I also advised the patient to eat a healthy diet with plenty of whole grains, cereals, fruits and vegetables, exercise regularly and maintain ideal body weight.  Followup in the future with me in 3 months or call earlier if needed       Thank you for coming to see [name] at [facility]. I hope we have been able to provide you high quality care today.  You may receive a patient satisfaction survey over the next few weeks. We would appreciate your feedback and comments so that we may continue to improve ourselves and the health of our patients.

## 2018-10-26 ENCOUNTER — Telehealth: Payer: Self-pay | Admitting: Adult Health

## 2018-10-26 IMAGING — CT CT HEAD CODE STROKE
3 of 4 series · 17 of 47 positions shown, 20 images · non-contrast
Comparison: Head CT 09/27/2014

CLINICAL DATA: Code stroke. Altered mental status. Minimal
unresponsiveness.

EXAM:
CT HEAD WITHOUT CONTRAST
TECHNIQUE: Contiguous axial images were obtained from the base of the skull
through the vertex without intravenous contrast.

[Series 201: head w/o, idose (1) · axial · non-contrast · 0.45mm/px · z∈[+1045,+1170]mm · 11 of 31 slices shown, 14 images]
[im 3/31  brain]
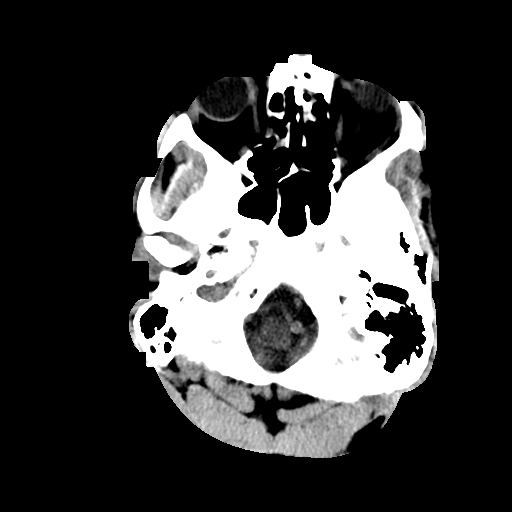
[im 3/31  bone]
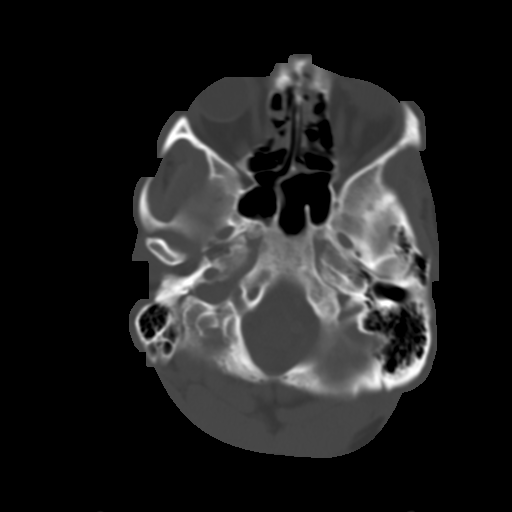
[im 5/31  brain]
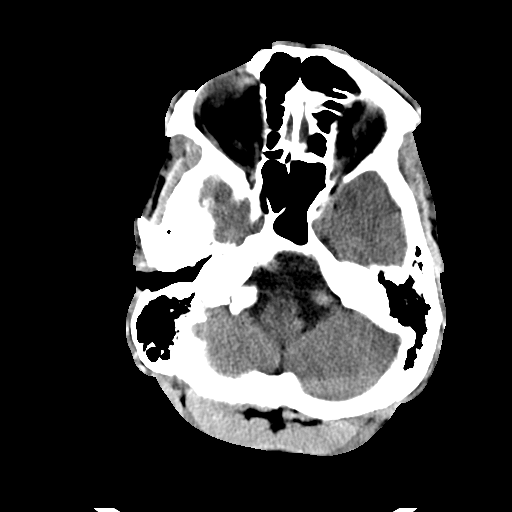
[im 7/31  brain]
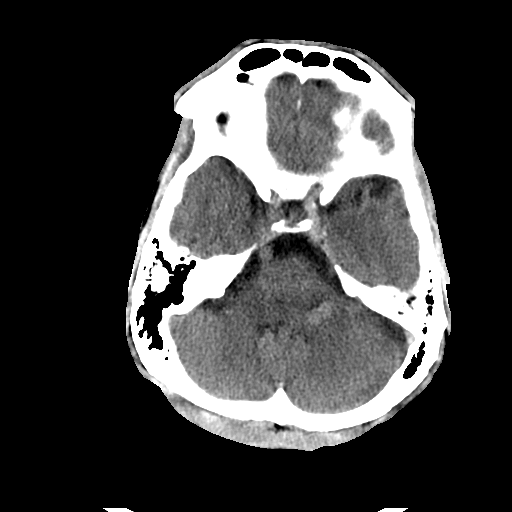
[im 11/31  brain]
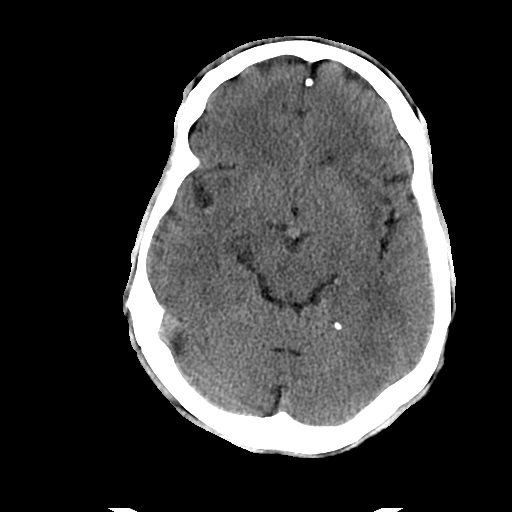
[im 13/31  brain]
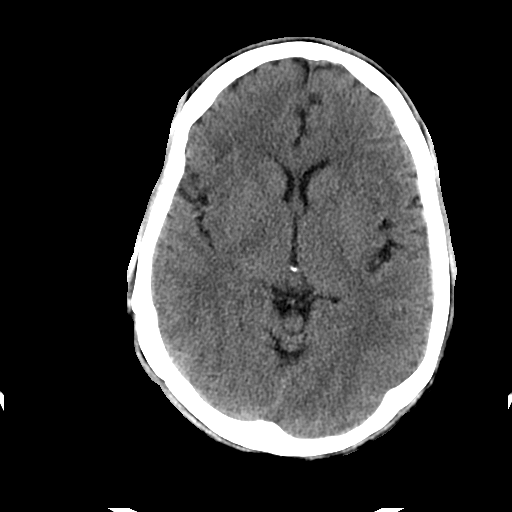
[im 13/31  bone]
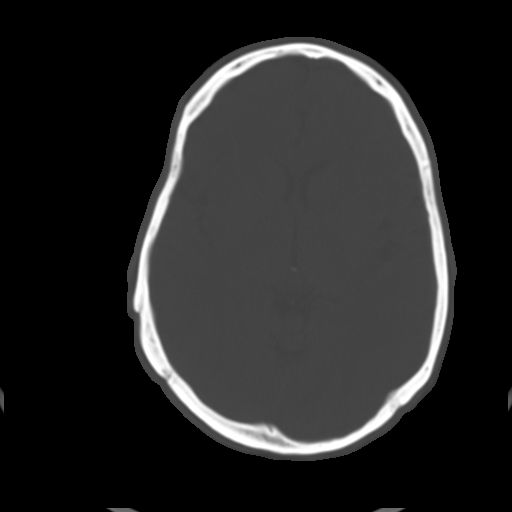
[im 16/31  brain]
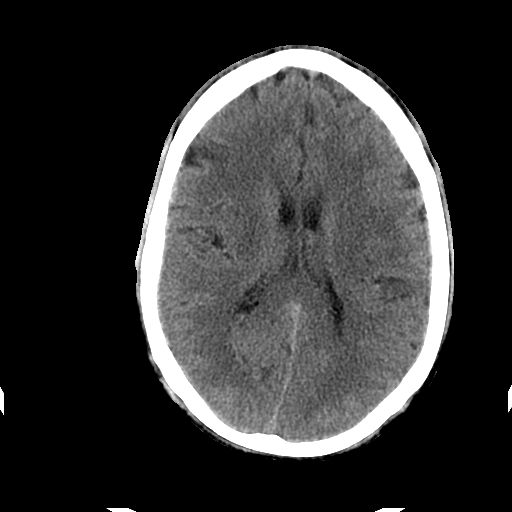
[im 18/31  brain]
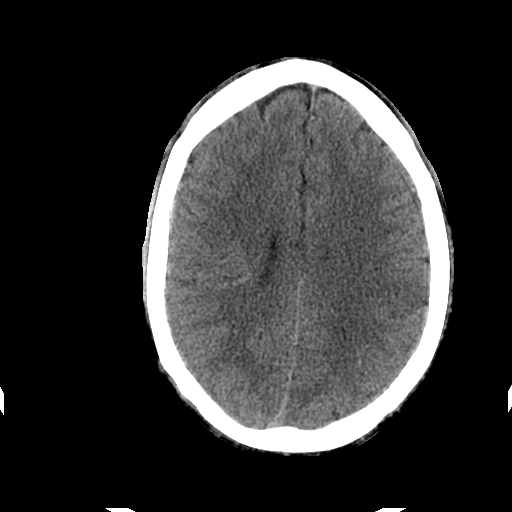
[im 20/31  brain]
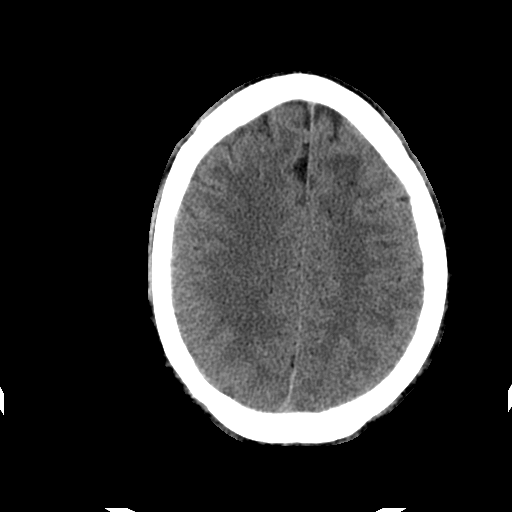
[im 24/31  brain]
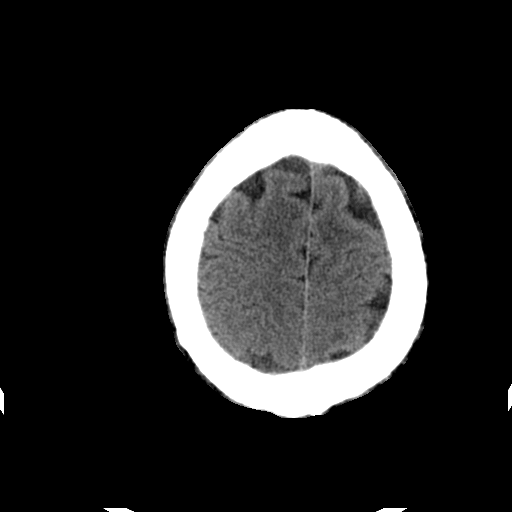
[im 24/31  bone]
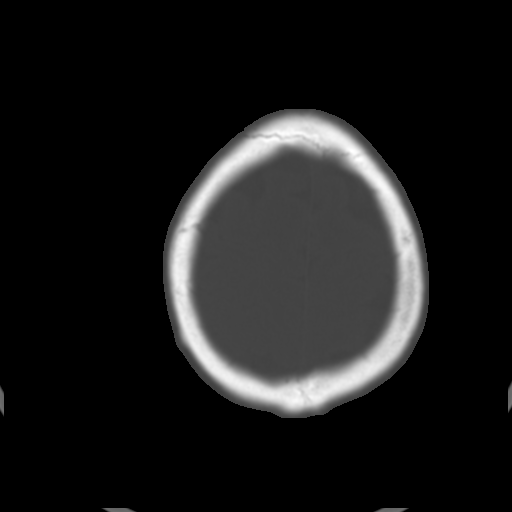
[im 26/31  brain]
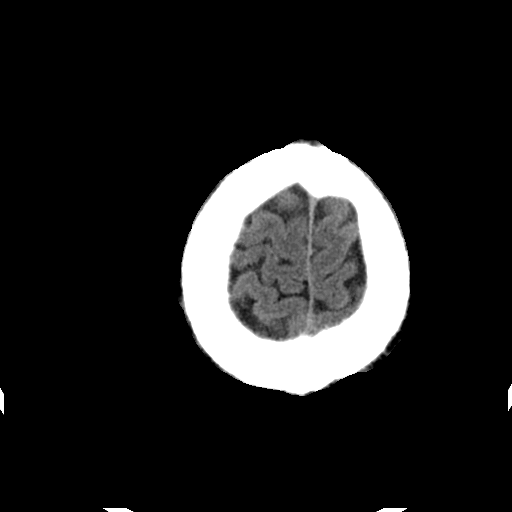
[im 28/31  brain]
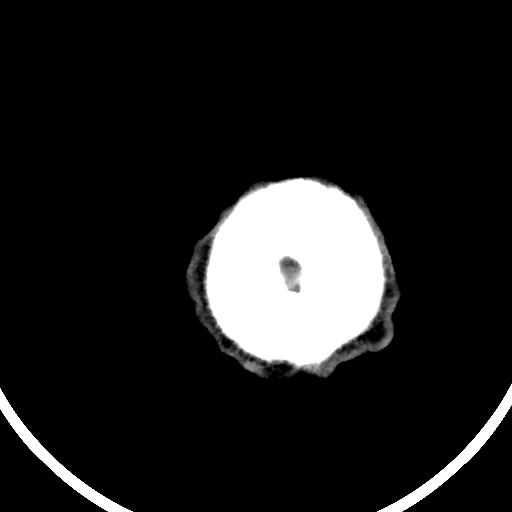

[Series 203: coronal st, idose (1) · coronal · 0.40mm/px · 3 of 69 slices shown]
[im 23/69  brain]
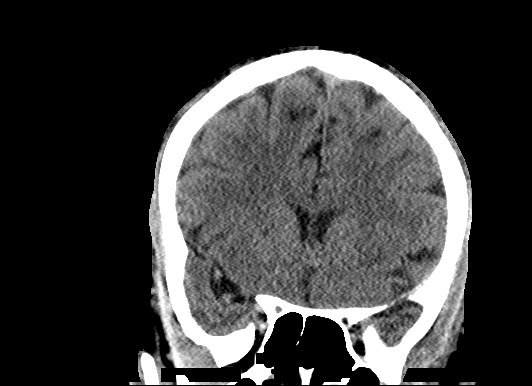
[im 31/69  brain]
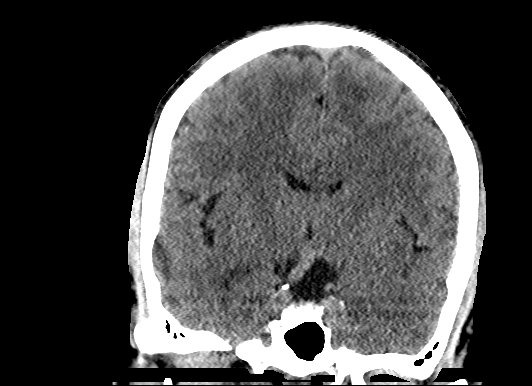
[im 38/69  brain]
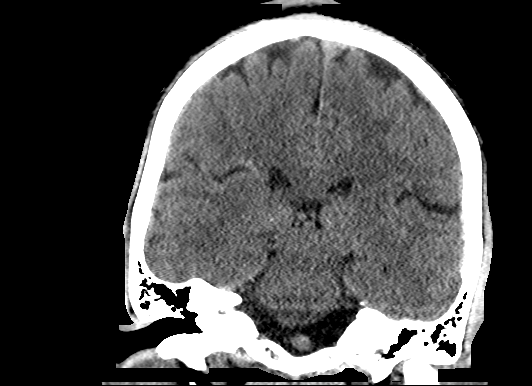

[Series 204: sagittal st, idose (1) · sagittal · 0.40mm/px · 3 of 71 slices shown]
[im 24/71  brain]
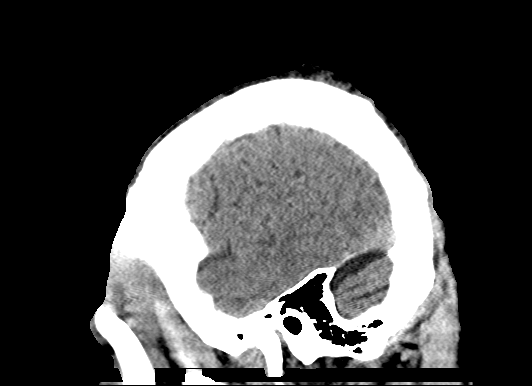
[im 36/71  brain]
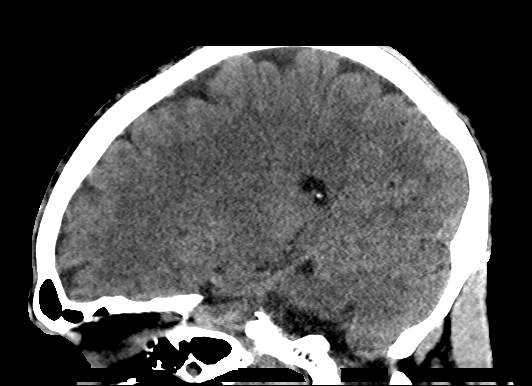
[im 47/71  brain]
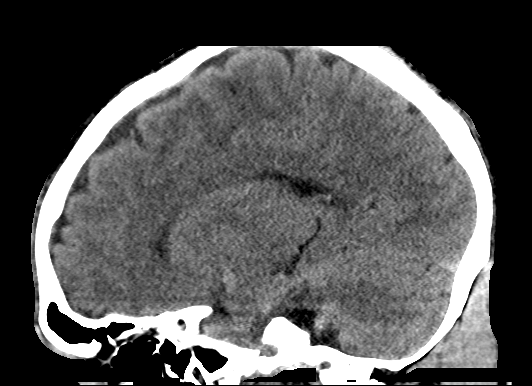

[17 of 47 positions shown; findings below may reference images not displayed]

FINDINGS: Brain: No mass lesion, intraparenchymal hemorrhage or extra-axial
collection. No evidence of acute cortical infarct. There is
periventricular hypoattenuation compatible with chronic
microvascular disease. There are areas in both frontal lobes that
appear to worsened slightly from the prior study.

Vascular: No hyperdense vessel or unexpected calcification.

Skull: Normal visualized skull base, calvarium and extracranial soft
tissues.

Sinuses/Orbits: No sinus fluid levels or advanced mucosal
thickening. No mastoid effusion. Normal orbits.

ASPECTS (Alberta Stroke Program Early CT Score)

- Ganglionic level infarction (caudate, lentiform nuclei, internal
capsule, insula, M1-M3 cortex): 7

- Supraganglionic infarction (M4-M6 cortex): 3

Total score (0-10 with 10 being normal): 10
IMPRESSION: 1. No acute intracranial abnormality. Slight worsening of bilateral
frontal predominant white matter disease, likely chronic
microvascular ischemia.
2. ASPECTS is 10.

These results were called by telephone at the time of interpretation
on 02/28/2016 at [DATE] to Dr. MAOS VIRALHADAS , who verbally
acknowledged these results.

## 2018-10-26 IMAGING — CT CT ANGIO HEAD
1 of 15 series · 1 of 33 positions shown · IV contrast (Iohexol (Omnipaque 350))
Comparison: None.

CLINICAL DATA: Suspected stroke.  Seizure during the examination.

EXAM:
CT ANGIOGRAPHY HEAD AND NECK
TECHNIQUE: Multidetector CT imaging of the head and neck was performed using
the standard protocol during bolus administration of intravenous
contrast. Multiplanar CT image reconstructions and MIPs were
obtained to evaluate the vascular anatomy. Carotid stenosis
measurements (when applicable) are obtained utilizing NASCET
criteria, using the distal internal carotid diameter as the
denominator.
CONTRAST:  50 mL Isovue 370 IV

[Series 200: locator · axial · 0.49mm/px · 1 of 1 slices shown]
[im 1/1  soft-tissue]
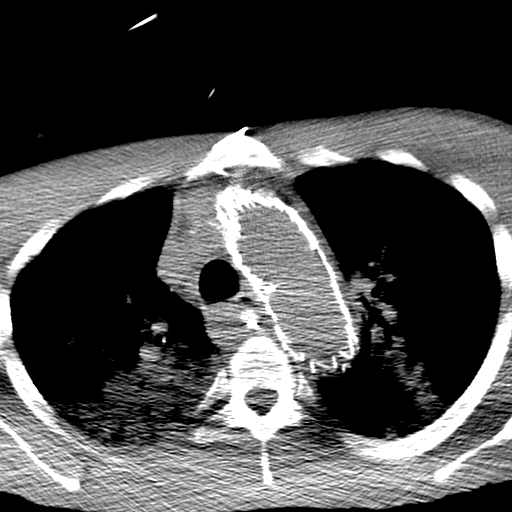

[1 of 33 positions shown; findings below may reference images not displayed]

FINDINGS: CTA NECK FINDINGS

Aortic arch: There is an aortic arch endograft stent. There is no
aneurysm or dissection of the visualized ascending aorta or aortic
arch. Proximal subclavian arteries are normal.

Right carotid system: The right common carotid origin is widely
patent. There is no common carotid or internal carotid artery
dissection or aneurysm. No hemodynamically significant stenosis.

Left carotid system: The left common carotid origin is widely
patent. There is no common carotid or internal carotid artery
dissection or aneurysm. No hemodynamically significant stenosis.

Vertebral arteries: The vertebral system is left dominant. Both
vertebral artery origins are normal. Both vertebral arteries are
normal to their confluence with the basilar artery.

Skeleton: There is no bony spinal canal stenosis. No lytic or
blastic lesions.

Other neck: The nasopharynx is clear. The oropharynx and hypopharynx
are normal. The epiglottis is normal. The supraglottic larynx,
glottis and subglottic larynx are normal. No retropharyngeal
collection. The parapharyngeal spaces are preserved. The parotid and
submandibular glands are normal. No sialolithiasis or salivary
ductal dilatation. The thyroid gland is normal. There is no cervical
lymphadenopathy.

Upper chest: There is bilateral upper lobe consolidation.

Review of the MIP images confirms the above findings

CTA HEAD FINDINGS

Anterior circulation:

--Intracranial internal carotid arteries: Normal.

--Anterior cerebral arteries: There is a congenitally absent right
A1 segment, a normal variant. Otherwise, the anterior cerebral
arteries are normal.

--Middle cerebral arteries: Moderate narrowing of the left M2
segment proximally. Mild irregularity of the Otherwise normal.

--Posterior communicating arteries: Absent bilaterally.

Posterior circulation:

--Posterior cerebral arteries: There is moderate to severe narrowing
of the P2 segment of the right PCA. There is mild multifocal
narrowing of the left PCA.

--Superior cerebellar arteries: Normal.

--Basilar artery: There is basilar dolichoectasia, with luminal
diameter measuring up to 5.5 mm.

--Anterior inferior cerebellar arteries: Not clearly visualized,
which is not uncommon.

--Posterior inferior cerebellar arteries: Normal.

Venous sinuses: As permitted by contrast timing, patent.

Anatomic variants: None

Review of the MIP images confirms the above findings
IMPRESSION: 1. No intracranial arterial occlusion.
2. Moderate narrowing of the left MCA M2 segments.
3. Moderate to severe narrowing of the right PCA P2 segment.
4. Basilar artery dolichoectasia.
5. Bilateral pulmonary upper lobe consolidation, likely aspiration
or pneumonia.
6. Normal CTA of the neck.
7. Aortic atherosclerosis.

## 2018-10-26 NOTE — Telephone Encounter (Signed)
[employer] [name]: [id number] (exp. 10/26/18 to 11/25/18) order sent to GI. They will reach out to the patient to schedule.

## 2018-10-27 IMAGING — CR DG ABD PORTABLE 1V
1 series · 1 of 1 positions shown · non-contrast
Comparison: None.

CLINICAL DATA: OG tube placement

EXAM:
PORTABLE ABDOMEN - 1 VIEW

[AP]
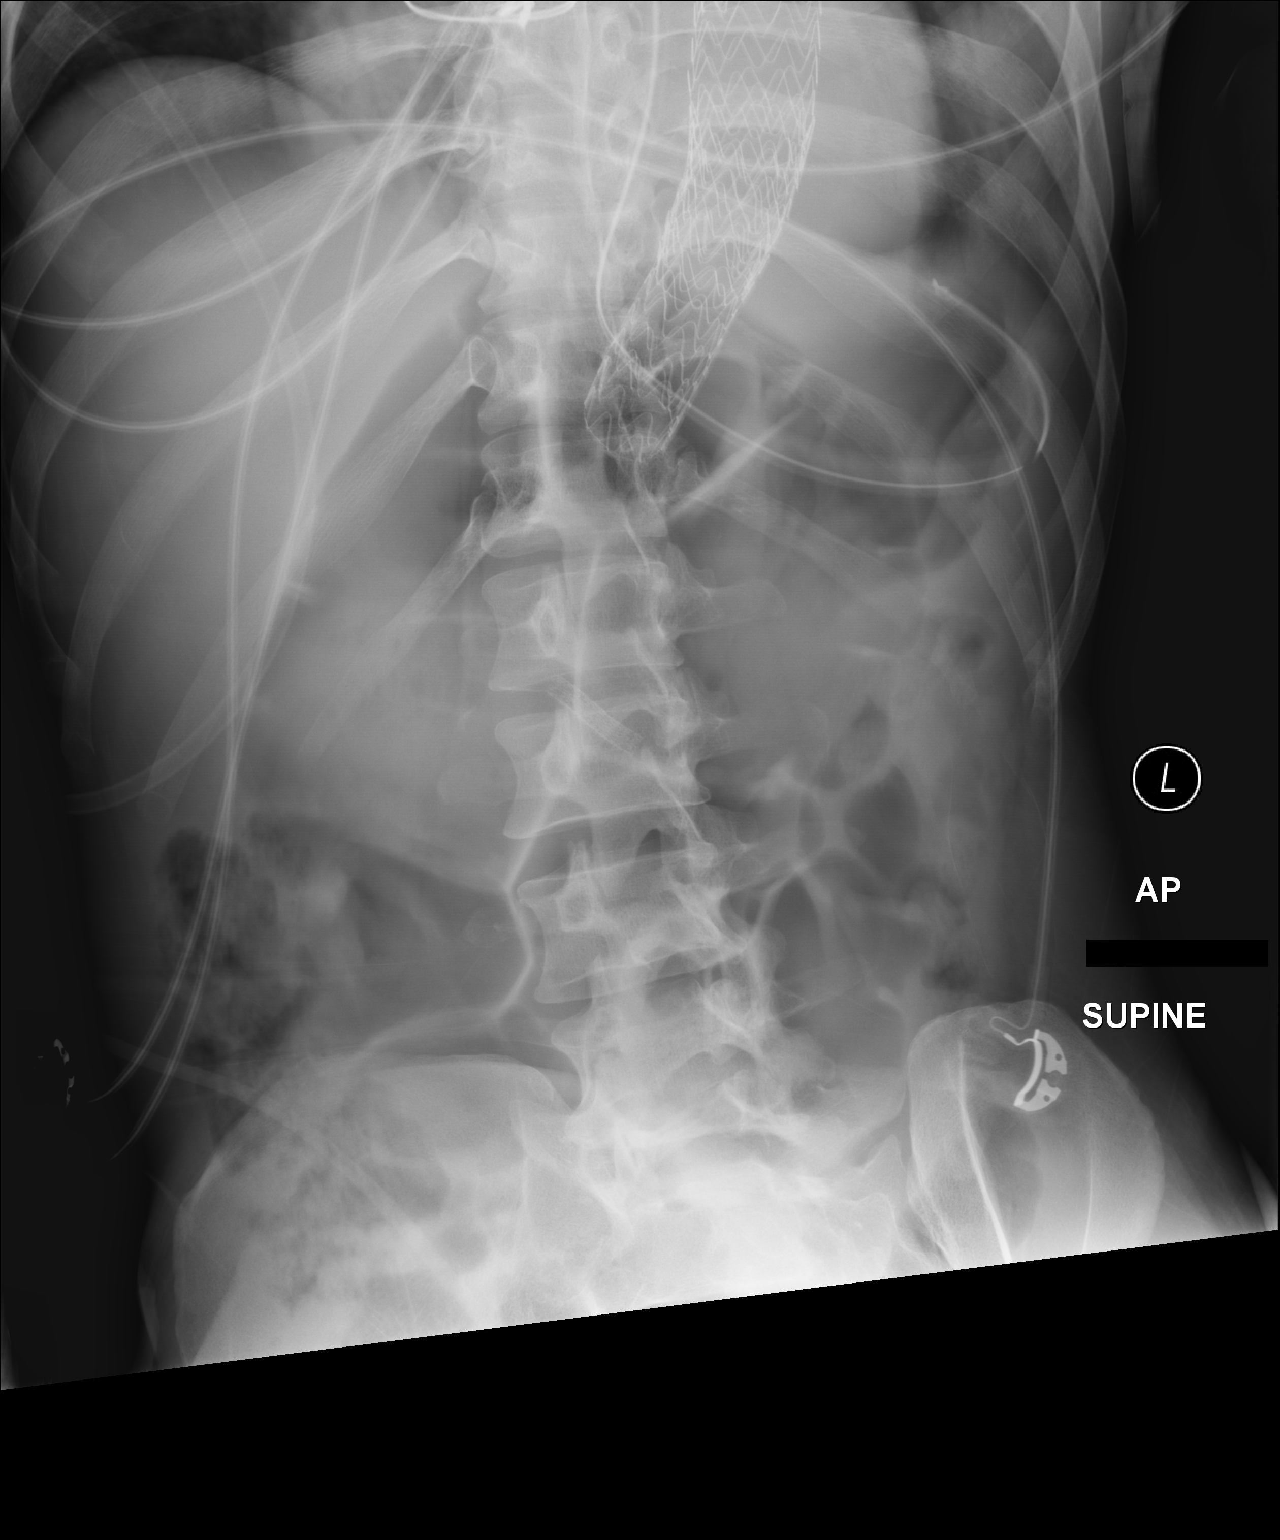

[1 of 1 positions shown; findings below may reference images not displayed]

FINDINGS: OG tube coils in the fundus of the stomach. Aortic stents noted.
Nonobstructive bowel gas pattern.
IMPRESSION: OG tube coils in the fundus of the stomach.

## 2018-10-28 IMAGING — CR DG CHEST 1V PORT
1 series · 1 of 1 positions shown · non-contrast
Comparison: 02/28/2016.

CLINICAL DATA: Respiratory failure.

EXAM:
PORTABLE CHEST 1 VIEW

[AP]
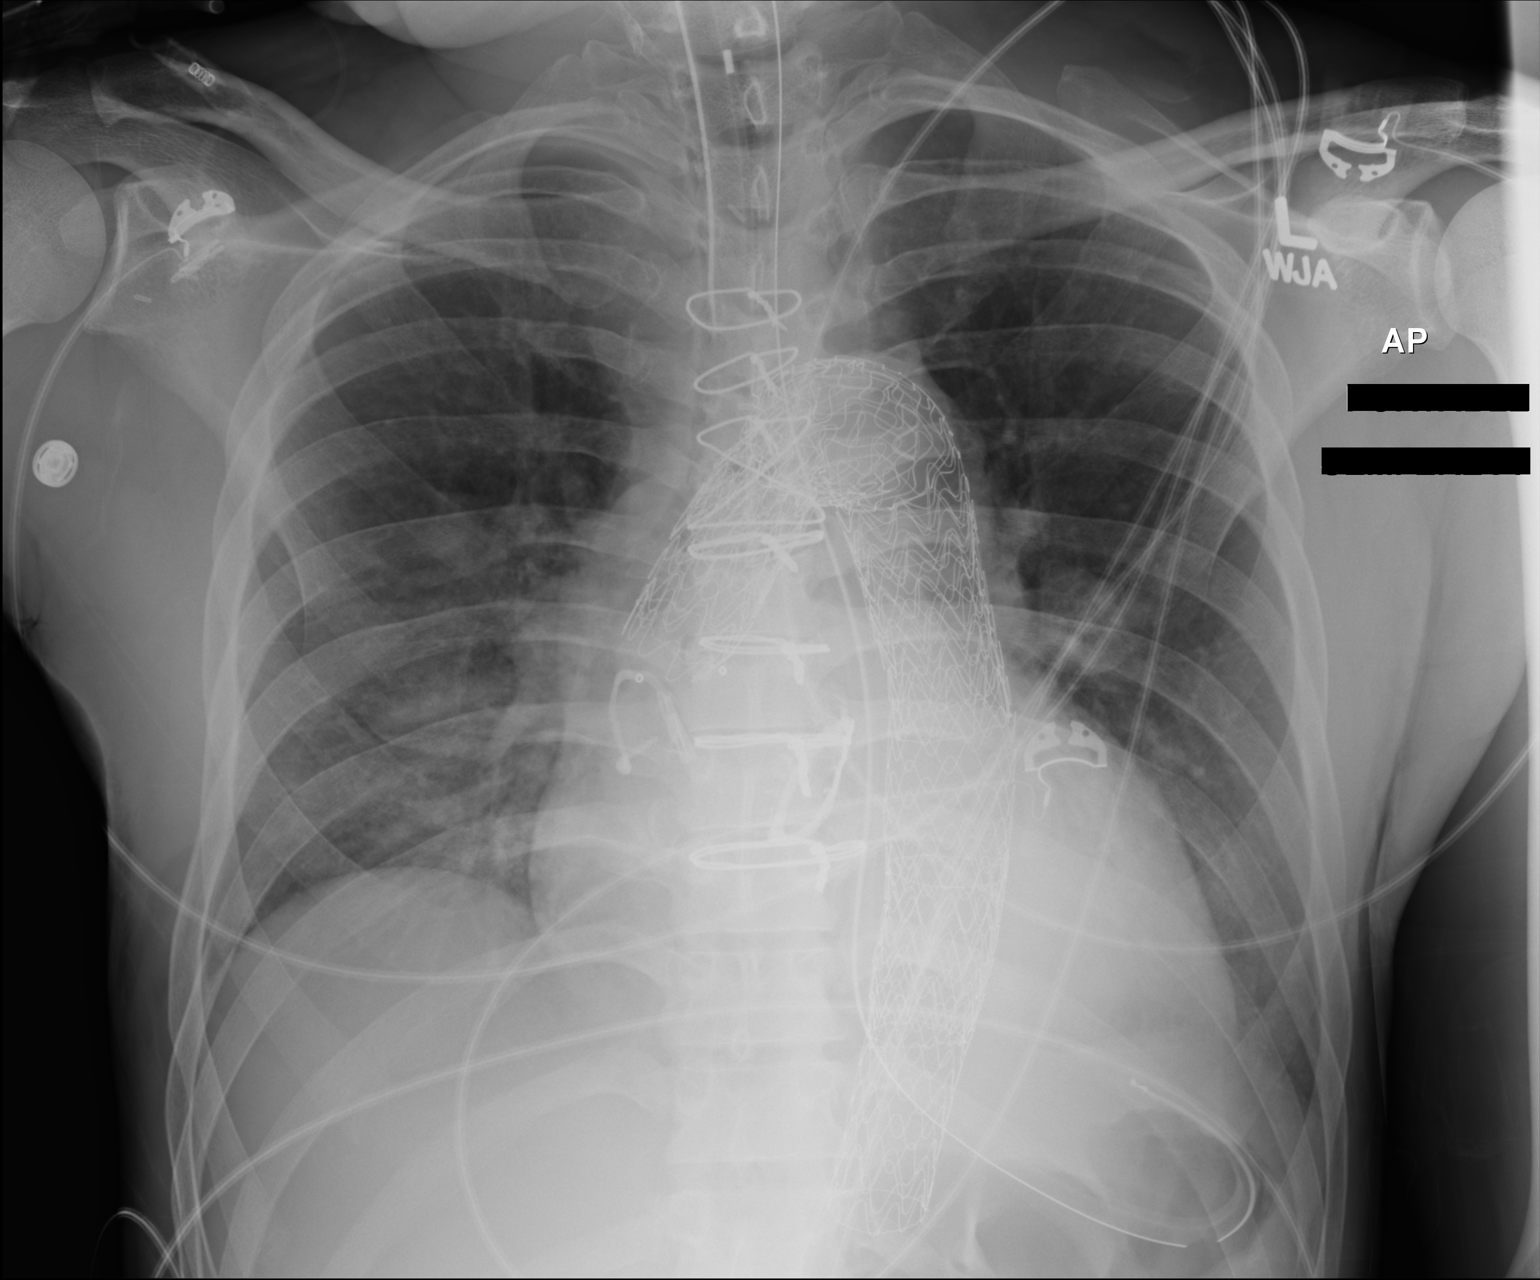

[1 of 1 positions shown; findings below may reference images not displayed]

FINDINGS: Stable enlarged cardiac silhouette, median sternotomy wires,
prosthetic aortic valve and aortic stent graft. Nasogastric tube tip
in the proximal stomach. Endotracheal tube in satisfactory position.
Increased patchy opacity at the left lung base and interval mild
patchy opacity at the right lung base. Unremarkable bones.
IMPRESSION: 1. Increased left basilar atelectasis, aspiration pneumonitis or
pneumonia.
2. Interval mild right basilar atelectasis, pneumonia or aspiration
pneumonitis.

## 2018-10-29 NOTE — Progress Notes (Signed)
I agree with the above plan 

## 2018-10-30 IMAGING — CT CT ABD-PELV W/O CM
2 of 4 series · 15 of 46 positions shown, 17 images · non-contrast
Comparison: 03/21/2015

CLINICAL DATA: Abdominal pain tonight.

EXAM:
CT ABDOMEN AND PELVIS WITHOUT CONTRAST
TECHNIQUE: Multidetector CT imaging of the abdomen and pelvis was performed
following the standard protocol without IV contrast.

[Series 2: abd/ pelvis 5.0 i30f 1 · axial · 0.75mm/px · z∈[-550,-90]mm · 12 of 102 slices shown, 14 images]
[im 5/102  soft-tissue]
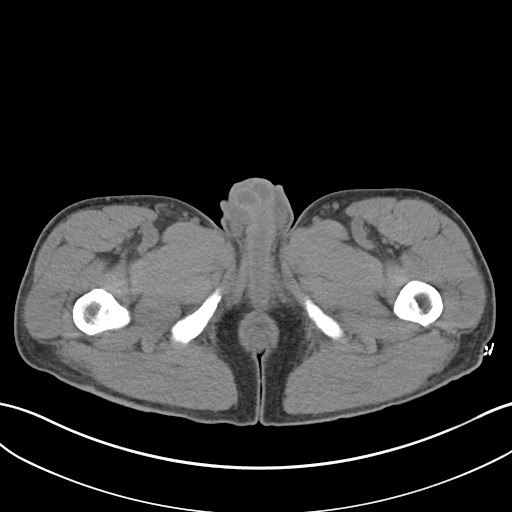
[im 5/102  bone]
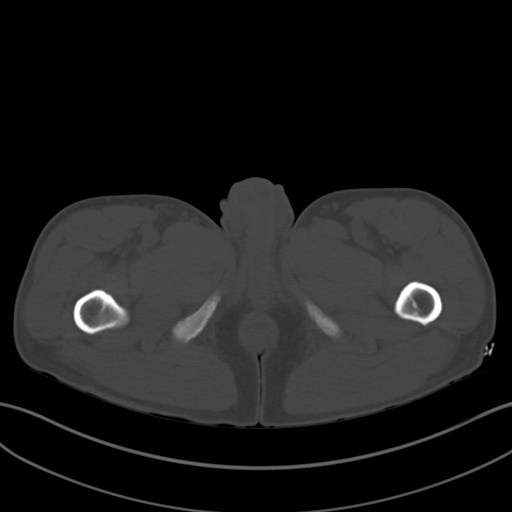
[im 13/102  soft-tissue]
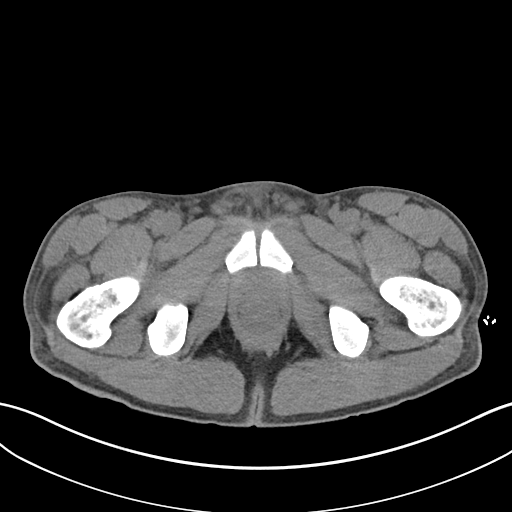
[im 22/102  soft-tissue]
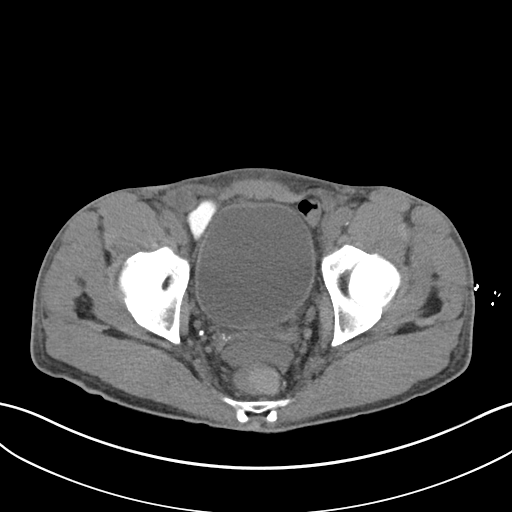
[im 30/102  soft-tissue]
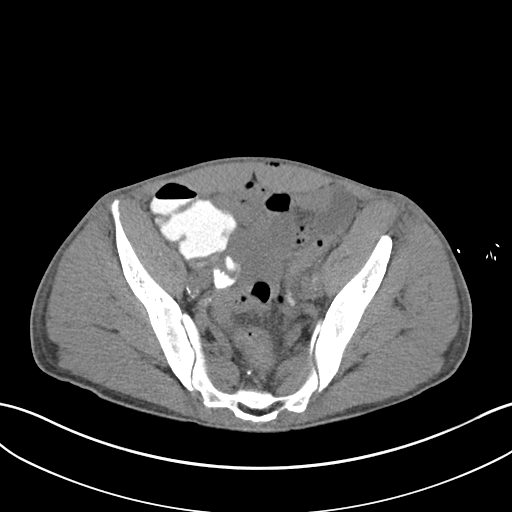
[im 38/102  soft-tissue]
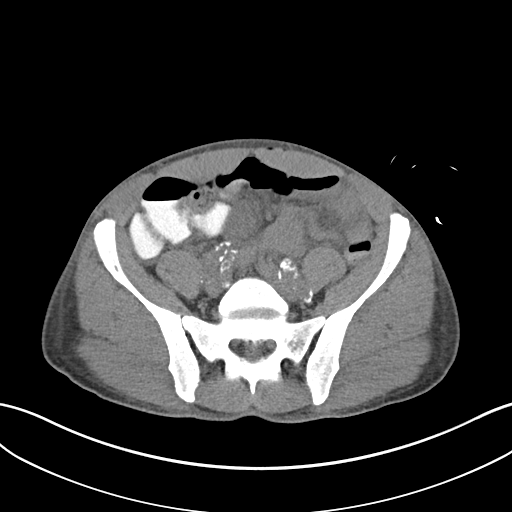
[im 47/102  soft-tissue]
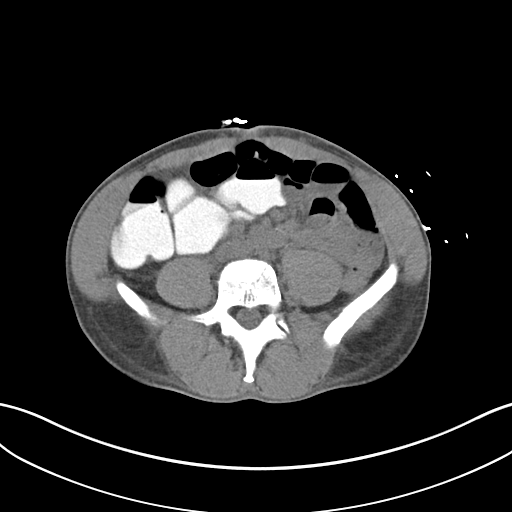
[im 55/102  soft-tissue]
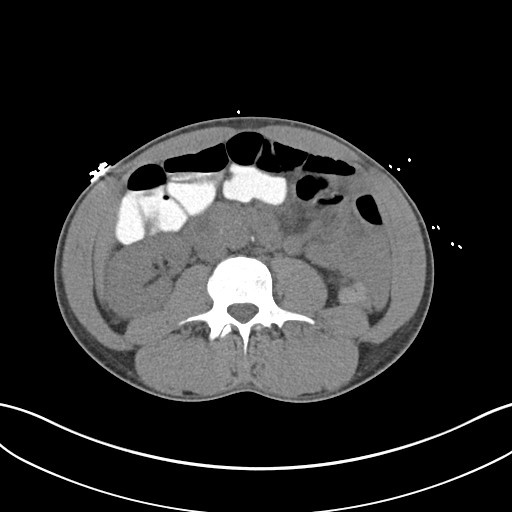
[im 64/102  soft-tissue]
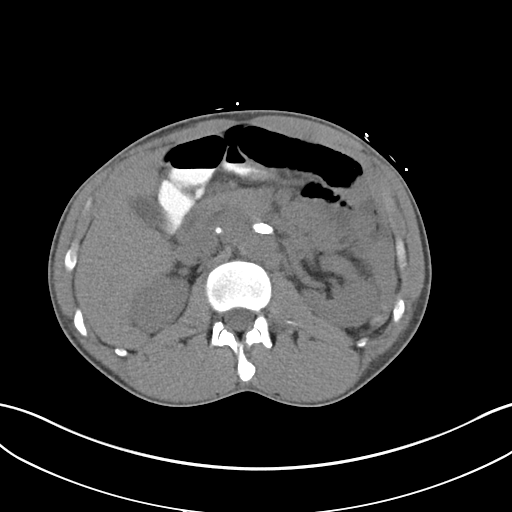
[im 72/102  soft-tissue]
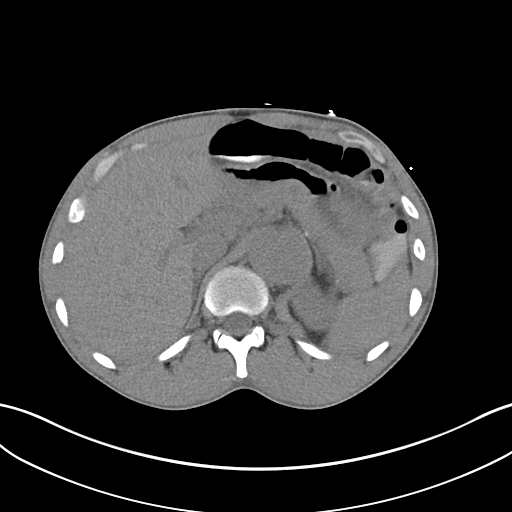
[im 72/102  bone]
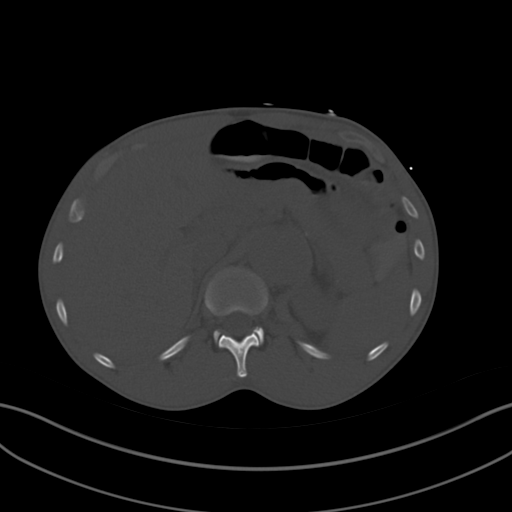
[im 80/102  soft-tissue]
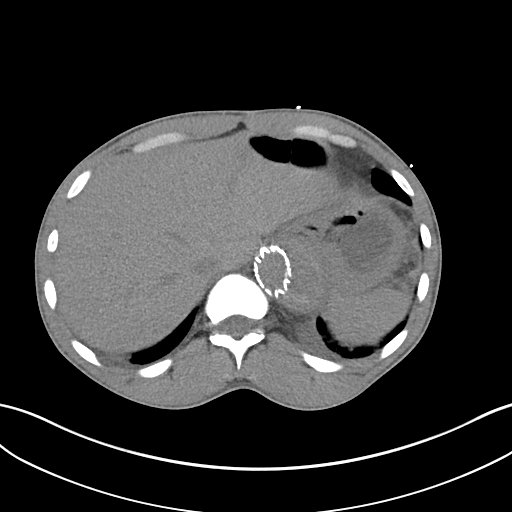
[im 89/102  soft-tissue]
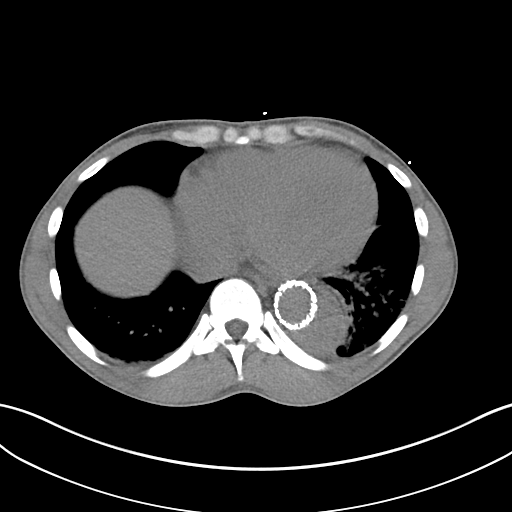
[im 97/102  soft-tissue]
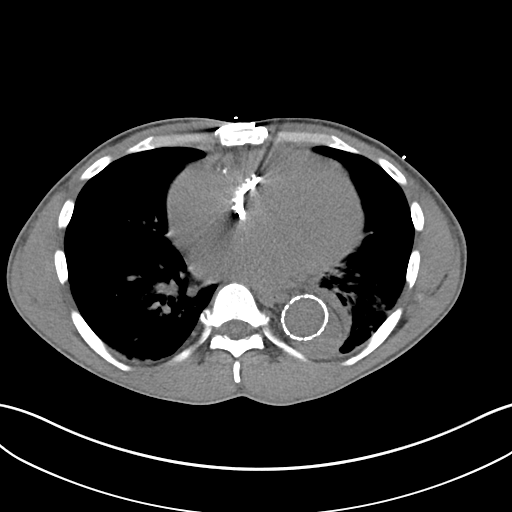

[Series 5: cor st · coronal · 0.78mm/px · 3 of 101 slices shown]
[im 34/101  soft-tissue]
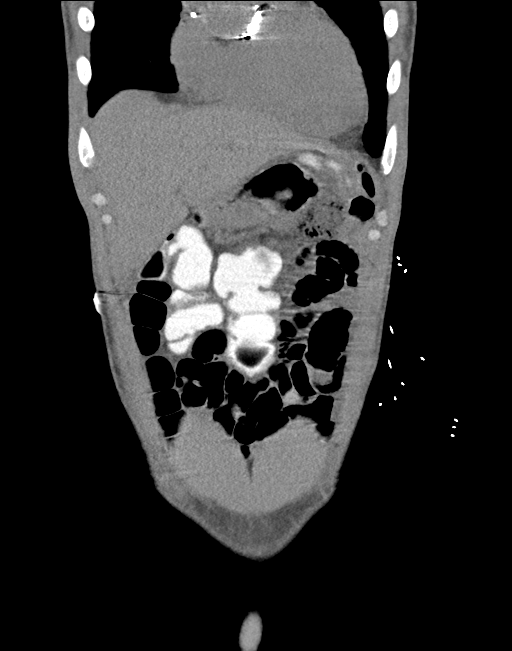
[im 45/101  soft-tissue]
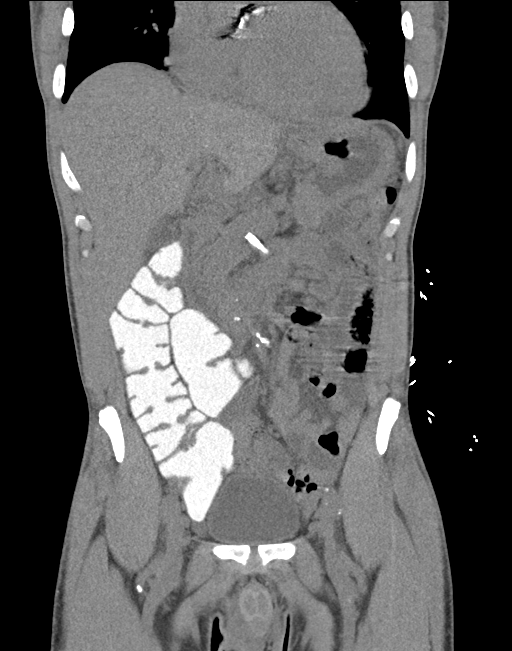
[im 56/101  soft-tissue]
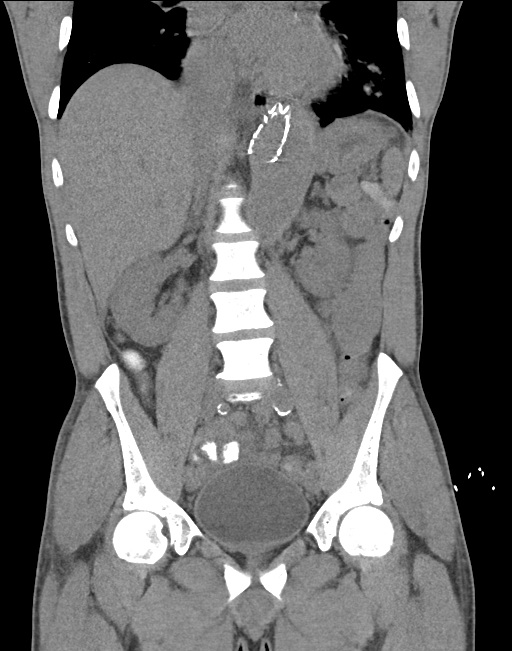

[15 of 46 positions shown; findings below may reference images not displayed]

FINDINGS: Lower chest: Descending thoracic aortic dissection with endovascular
stent graft, appearing grossly unchanged. The adjacent left lung
base atelectasis. No acute findings are evident in the lower chest.

Hepatobiliary: Cholelithiasis. No focal liver lesion is evident on
unenhanced scanning. No bile duct dilatation.

Pancreas: Unremarkable unenhanced appearances of the pancreas. No
inflammatory changes or duct dilatation.

Spleen: Normal in size without focal abnormality.

Adrenals/Urinary Tract: No urinary calculi. No hydronephrosis. No
focal parenchymal lesions are evident. Urinary bladder is
unremarkable. Adrenals are unremarkable.

Stomach/Bowel: Stomach is within normal limits. Appendix is normal.
No evidence of bowel wall thickening, distention, or inflammatory
changes.

Vascular/Lymphatic: Upper abdominal aortic aneurysm and chronic
dissection. Aortic atherosclerosis. Bilateral common iliac artery
aneurysms. No retroperitoneal hematoma, but cannot assess for new
dissection or luminal patency in the absence of intravenous
contrast.

No pathologic adenopathy is evident in the abdomen or pelvis.

Reproductive: Unremarkable

Other: Bland-appearing air bubbles in the subcutaneous tissues of
the anterior abdominal wall likely represent injection sites. No
drainable collections.

No acute inflammatory changes are evident in the abdomen or pelvis.
No ascites.

The benign-appearing low-attenuation right inguinal lesion continues
to decrease in size, now measuring 2.9 cm in longest axis compared
to 4 cm on 03/21/2015.

Musculoskeletal: No significant skeletal lesions.
IMPRESSION: 1. Extensive thoracic and abdominal aortic dissection with
endoluminal stent graft. No conclusive interval changes, but
evaluation is limited in the absence of intravenous contrast.
2. No acute inflammatory changes in the abdomen or pelvis. No bowel
obstruction or perforation. No acute urologic abnormality.
3. Cholelithiasis.

## 2018-11-01 ENCOUNTER — Other Ambulatory Visit: Payer: Self-pay

## 2018-11-01 ENCOUNTER — Encounter (INDEPENDENT_AMBULATORY_CARE_PROVIDER_SITE_OTHER): Payer: Self-pay | Admitting: Primary Care

## 2018-11-01 ENCOUNTER — Ambulatory Visit (INDEPENDENT_AMBULATORY_CARE_PROVIDER_SITE_OTHER): Payer: Medicare HMO | Admitting: Primary Care

## 2018-11-01 ENCOUNTER — Ambulatory Visit (INDEPENDENT_AMBULATORY_CARE_PROVIDER_SITE_OTHER): Payer: Medicare HMO | Admitting: Licensed Clinical Social Worker

## 2018-11-01 VITALS — BP 127/49 | HR 69 | Temp 98.0°F | Ht 71.0 in | Wt 157.0 lb

## 2018-11-01 DIAGNOSIS — F5101 Primary insomnia: Secondary | ICD-10-CM | POA: Diagnosis not present

## 2018-11-01 DIAGNOSIS — F419 Anxiety disorder, unspecified: Secondary | ICD-10-CM

## 2018-11-01 DIAGNOSIS — F17210 Nicotine dependence, cigarettes, uncomplicated: Secondary | ICD-10-CM | POA: Diagnosis not present

## 2018-11-01 DIAGNOSIS — F329 Major depressive disorder, single episode, unspecified: Secondary | ICD-10-CM | POA: Diagnosis not present

## 2018-11-01 DIAGNOSIS — Z720 Tobacco use: Secondary | ICD-10-CM

## 2018-11-01 DIAGNOSIS — F331 Major depressive disorder, recurrent, moderate: Secondary | ICD-10-CM

## 2018-11-01 MED ORDER — BUPROPION HCL ER (XL) 150 MG PO TB24: 150.0000 mg | ORAL_TABLET | Freq: Every day | ORAL | 0 refills | Status: DC

## 2018-11-01 NOTE — Patient Instructions (Signed)
Bupropion extended-release tablets (Depression/Mood Disorders) What is this medicine? BUPROPION (byoo PROE pee on) is used to treat depression. This medicine may be used for other purposes; ask your health care provider or pharmacist if you have questions. COMMON BRAND NAME(S): Aplenzin, Budeprion XL, Forfivo XL, Wellbutrin XL What should I tell my health care provider before I take this medicine? They need to know if you have any of these conditions:  an eating disorder, such as anorexia or bulimia  bipolar disorder or psychosis  diabetes or high blood sugar, treated with medication  glaucoma  head injury or brain tumor  heart disease, previous heart attack, or irregular heart beat  high blood pressure  kidney or liver disease  seizures (convulsions)  suicidal thoughts or a previous suicide attempt  Tourette's syndrome  weight loss  an unusual or allergic reaction to bupropion, other medicines, foods, dyes, or preservatives  breast-feeding  pregnant or trying to become pregnant How should I use this medicine? Take this medicine by mouth with a glass of water. Follow the directions on the prescription label. You can take it with or without food. If it upsets your stomach, take it with food. Do not crush, chew, or cut these tablets. This medicine is taken once daily at the same time each day. Do not take your medicine more often than directed. Do not stop taking this medicine suddenly except upon the advice of your doctor. Stopping this medicine too quickly may cause serious side effects or your condition may worsen. A special MedGuide will be given to you by the pharmacist with each prescription and refill. Be sure to read this information carefully each time. Talk to your pediatrician regarding the use of this medicine in children. Special care may be needed. Overdosage: If you think you have taken too much of this medicine contact a poison control center or emergency room  at once. NOTE: This medicine is only for you. Do not share this medicine with others. What if I miss a dose? If you miss a dose, skip the missed dose and take your next tablet at the regular time. Do not take double or extra doses. What may interact with this medicine? Do not take this medicine with any of the following medications:  linezolid  MAOIs like Azilect, Carbex, Eldepryl, Marplan, Nardil, and Parnate  methylene blue (injected into a vein)  other medicines that contain bupropion like Zyban This medicine may also interact with the following medications:  alcohol  certain medicines for anxiety or sleep  certain medicines for blood pressure like metoprolol, propranolol  certain medicines for depression or psychotic disturbances  certain medicines for HIV or AIDS like efavirenz, lopinavir, nelfinavir, ritonavir  certain medicines for irregular heart beat like propafenone, flecainide  certain medicines for Parkinson's disease like amantadine, levodopa  certain medicines for seizures like carbamazepine, phenytoin, phenobarbital  cimetidine  clopidogrel  cyclophosphamide  digoxin  furazolidone  isoniazid  nicotine  orphenadrine  procarbazine  steroid medicines like prednisone or cortisone  stimulant medicines for attention disorders, weight loss, or to stay awake  tamoxifen  theophylline  thiotepa  ticlopidine  tramadol  warfarin This list may not describe all possible interactions. Give your health care provider a list of all the medicines, herbs, non-prescription drugs, or dietary supplements you use. Also tell them if you smoke, drink alcohol, or use illegal drugs. Some items may interact with your medicine. What should I watch for while using this medicine? Tell your doctor if your symptoms do   not get better or if they get worse. Visit your doctor or healthcare provider for regular checks on your progress. Because it may take several weeks to  see the full effects of this medicine, it is important to continue your treatment as prescribed by your doctor. This medicine may cause serious skin reactions. They can happen weeks to months after starting the medicine. Contact your healthcare provider right away if you notice fevers or flu-like symptoms with a rash. The rash may be red or purple and then turn into blisters or peeling of the skin. Or, you might notice a red rash with swelling of the face, lips or lymph nodes in your neck or under your arms. Patients and their families should watch out for new or worsening thoughts of suicide or depression. Also watch out for sudden changes in feelings such as feeling anxious, agitated, panicky, irritable, hostile, aggressive, impulsive, severely restless, overly excited and hyperactive, or not being able to sleep. If this happens, especially at the beginning of treatment or after a change in dose, call your healthcare provider. Avoid alcoholic drinks while taking this medicine. Drinking large amounts of alcoholic beverages, using sleeping or anxiety medicines, or quickly stopping the use of these agents while taking this medicine may increase your risk for a seizure. Do not drive or use heavy machinery until you know how this medicine affects you. This medicine can impair your ability to perform these tasks. Do not take this medicine close to bedtime. It may prevent you from sleeping. Your mouth may get dry. Chewing sugarless gum or sucking hard candy, and drinking plenty of water may help. Contact your doctor if the problem does not go away or is severe. The tablet shell for some brands of this medicine does not dissolve. This is normal. The tablet shell may appear whole in the stool. This is not a cause for concern. What side effects may I notice from receiving this medicine? Side effects that you should report to your doctor or health care professional as soon as possible:  allergic reactions like  skin rash, itching or hives, swelling of the face, lips, or tongue  breathing problems  changes in vision  confusion  elevated mood, decreased need for sleep, racing thoughts, impulsive behavior  fast or irregular heartbeat  hallucinations, loss of contact with reality  increased blood pressure  rash, fever, and swollen lymph nodes  redness, blistering, peeling or loosening of the skin, including inside the mouth  seizures  suicidal thoughts or other mood changes  unusually weak or tired  vomiting Side effects that usually do not require medical attention (report to your doctor or health care professional if they continue or are bothersome):  constipation  headache  loss of appetite  nausea  tremors  weight loss This list may not describe all possible side effects. Call your doctor for medical advice about side effects. You may report side effects to FDA at 1-800-FDA-1088. Where should I keep my medicine? Keep out of the reach of children. Store at room temperature between 15 and 30 degrees C (59 and 86 degrees F). Throw away any unused medicine after the expiration date. NOTE: This sheet is a summary. It may not cover all possible information. If you have questions about this medicine, talk to your doctor, pharmacist, or health care provider.  2020 [employer] (2018-05-26 13:45:31)  

## 2018-11-01 NOTE — Progress Notes (Signed)
Established Patient Office Visit  Subjective:  Patient ID: [name], male    DOB: [birth year]  Age: 48 y.o. MRN: [id number]  CC:  Chief Complaint  Patient presents with  . Follow-up    insomnia/depression     HPI [name] presents for follow up on medication for anxiety and insomnia he states the trazodone made him feel sick. He is willing to try a different type of medication.  Past Medical History:  Diagnosis Date  . Adenomatous colon polyp 08/2015  . Anxiety   . Aortic disease ([place])   . Aortic dissection ([facility])    a. admx 04/2014 >> L renal infarct; a/c renal failure >> b.  s/p Bioprosthetic [place] and total arch replacement and staged endovascular repair of descending aortic aneurysm ([facility] - Dr. [name])  . CAD (coronary artery disease)    a. LHC 4/16:  oD1 60%  . Cardiomyopathy ([place])    a. non-ischemic - probably related to untreated HTN and ETOH abuse - Echo 3/13 with EF 35-40% >> b. Echo 4/16: Severe LVH, EF 55-60%, moderate AI, moderate MR, mild LAE, trivial effusion, known type B dissection with communication between true and false lumens with suprasternal images suggesting dissection plane may propagate to at least left subclavian takeoff, root above aortic valve okay    . Chronic abdominal pain   . Chronic combined systolic and diastolic congestive heart failure ([place])    a. 05/2011: Adm with pulm edema/HTN urgency, EF 35-40% with diffuse hypokinesis and moderate to severe mitral regurgitation. Cardiomyopathy likely due to uncontrolled HTN and ETOH abuse - cath deferred due to renal insufficiency (felt due to uncontrolled HTN). bJodie Echevaria MV 06/2011: EF 37% and no ischemia or infarction. c. EF 45-50% by echo 01/2012.  [name] Chronic sinusitis   . CKD (chronic kidney disease)    a. 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  peak creatinine 3.46 during admx for aortic dissection 2/16.  sees Dr [name]  . DDD (degenerative disc disease), lumbar   . Depression   . Descending thoracic  aortic aneurysm ([place])   . Dissecting aneurysm of thoracic aorta ([place])   . ETOH abuse    a. Reported to have quit 05/2011.  . Frequent headaches   . GERD (gastroesophageal reflux disease)   . Headache(784.0)    "q other day" (08/08/2013)  . Heart murmur   . Hemorrhoid thrombosis   . History of echocardiogram    Echo 1/17:  Severe LVH, EF 55-60%, no RWMA, Gr 2 DD, AVR ok, mild to mod MR, mild LAE, mild reduced RVSF, mod RAE  . History of medication noncompliance   . HYPERLIPIDEMIA   . Hypertension    a. Hx of HTN urgency secondary to noncompliance. b. urinary metanephrine and catecholeamine levels normal 2013.  c. Renal art [name] 1/16:  No evidence of renal artery stenosis noted bilaterally.  . INGUINAL HERNIA   . Pneumonia ~ 2013  . Renal insufficiency   . Serrated adenoma of colon 08/2015  . Stroke ([place])   . Tobacco abuse   . Valvular heart disease    a. Echo 05/2011: moderate to severe eccentric MR and mild to moderate AI with prolapsing left coronary cusp. b. Echo 01/2012: mild-mod AI, mild dilitation of aortic root, mild MR.;  c. Echo 1/16: Severe LVH consistent with hypertrophic cardio myopathy, EF 50%, no RWMA, mod AI, mild MR, mild RAE, dilated Ao root (40 mm);     Past Surgical History:  Procedure Laterality Date  .  ANKLE SURGERY Bilateral    Fractures bilaterally  . AORTIC VALVE SURGERY  09/2014  . CORONARY ANGIOGRAPHY N/A 12/06/2017   Procedure: CORONARY ANGIOGRAPHY;  Surgeon: Belva Crome, MD;  Location: [facility];  Service: Cardiovascular;  Laterality: N/A;  . CORONARY ANGIOGRAPHY N/A 09/26/2018   Procedure: CORONARY ANGIOGRAPHY;  Surgeon: Nelva Bush, MD;  Location: [facility];  Service: Cardiovascular;  Laterality: N/A;  . CORONARY STENT INTERVENTION N/A 12/10/2017   Procedure: CORONARY STENT INTERVENTION;  Surgeon: [name] Sine, MD;  Location: [facility];  Service: Cardiovascular;  Laterality: N/A;  . FOOT FRACTURE SURGERY Bilateral  2004-2010   "got pins in both of them"  . HEMORRHOID SURGERY N/A 06/15/2015   Procedure: HEMORRHOIDECTOMY;  Surgeon: Stark Klein, MD;  Location: [facility];  Service: General;  Laterality: N/A;  . INGUINAL HERNIA REPAIR Right ~ 1996  . LEFT HEART CATHETERIZATION WITH CORONARY ANGIOGRAM N/A 06/21/2014   Procedure: LEFT HEART CATHETERIZATION WITH CORONARY ANGIOGRAM;  Surgeon: Larey Dresser, MD;  Location: [facility] C[facility] LAB;  Service: Cardiovascular;  Laterality: N/A;  . LOOP RECORDER INSERTION N/A 06/19/2016   Procedure: Loop Recorder Insertion;  Surgeon: Deboraha Sprang, MD;  Location: [facility];  Service: Cardiovascular;  Laterality: N/A;  . TEE WITHOUT CARDIOVERSION N/A 03/12/2016   Procedure: TRANSESOPHAGEAL ECHOCARDIOGRAM (TEE);  Surgeon: Sanda Klein, MD;  Location: [facility] ENDOSCOPY;  Service: Cardiovascular;  Laterality: N/A;    Family History  Problem Relation Age of Onset  . Hypertension Mother   . Hypertension Other   . Colon cancer Paternal Uncle   . Stroke Maternal Aunt   . Heart attack Brother   . Diabetes Maternal Aunt   . Lung cancer Maternal Uncle   . Other Sister        "breathing machine at night"  . Headache Sister   . Thyroid disease Sister   . Throat cancer Neg Hx   . Pancreatic cancer Neg Hx   . Esophageal cancer Neg Hx   . Kidney disease Neg Hx   . Liver disease Neg Hx     Social History   Socioeconomic History  . Marital status: Married    Spouse name: Not on file  . Number of children: 5  . Years of education: 44  . Highest education level: Not on file  Occupational History  . Occupation: Disabled, part-time Programmer, systems    Comment: Disability  Social Needs  . Financial resource strain: Not hard at all  . Food insecurity    Worry: Never true    Inability: Never true  . Transportation needs    Medical: No    Non-medical: No  Tobacco Use  . Smoking status: Current Some Day Smoker    Packs/day: 0.25    Years: 23.00    Pack years: 5.75    Types:  Cigarettes  . Smokeless tobacco: Never Used  . Tobacco comment: 3 to 4 per day  Substance and Sexual Activity  . Alcohol use: Yes    Alcohol/week: 1.0 standard drinks    Types: 1 Cans of beer per week    Comment: occasionally  . Drug use: Yes    Types: Marijuana    Comment: 2 times/week  . Sexual activity: Yes  Lifestyle  . Physical activity    Days per week: Not on file    Minutes per session: Not on file  . Stress: Not on file  Relationships  . Social connections  Talks on phone: Not on file    Gets together: Not on file    Attends religious service: Not on file    Active member of club or organization: Not on file    Attends meetings of clubs or organizations: Not on file    Relationship status: Not on file  . Intimate partner violence    Fear of current or ex partner: Not on file    Emotionally abused: Not on file    Physically abused: Not on file    Forced sexual activity: Not on file  Other Topics Concern  . Not on file  Social History Narrative   Fun: Enjoy his children    Outpatient Medications Prior to Visit  Medication Sig Dispense Refill  . acetaminophen (TYLENOL) 325 MG tablet Take 650 mg by mouth every 6 (six) hours as needed (for pain or headaches).     [name] amitriptyline (ELAVIL) 25 MG tablet Take 1 tablet (25 mg total) by mouth at bedtime. 30 tablet 3  . aspirin EC 81 MG EC tablet Take 1 tablet (81 mg total) by mouth daily. 90 tablet 3  . cloNIDine (CATAPRES - DOSED IN MG/24 HR) 0.3 mg/24hr patch Place 1 patch (0.3 mg total) onto the skin once a week. 4 patch 6  . clopidogrel (PLAVIX) 75 MG tablet Take 1 tablet (75 mg total) by mouth daily with breakfast. 90 tablet 3  . Evolocumab (REPATHA SURECLICK) 185 MG/ML SOAJ Inject 1 Dose into the skin every 14 (fourteen) days. 2 pen 11  . ezetimibe (ZETIA) 10 MG tablet Take 1 tablet (10 mg total) by mouth daily. 90 tablet 3  . furosemide (LASIX) 40 MG tablet Take one tablet daily as needed for shortness of breath or  weight gain of more than 3 lbs in one day. (Patient taking differently: Take 40 mg by mouth daily as needed (shortness of breath or weight gain of more than 3 lbs in one day). ) 30 tablet 1  . isosorbide mononitrate (IMDUR) 60 MG 24 hr tablet Take 1 tablet (60 mg total) by mouth daily. (Patient taking differently: Take 60 mg by mouth daily as needed. ) 30 tablet 6  . pantoprazole (PROTONIX) 40 MG tablet Take 1 tablet (40 mg total) by mouth daily. 30 tablet 11  . rosuvastatin (CRESTOR) 40 MG tablet Take 1 tablet (40 mg total) by mouth daily. 90 tablet 3  . amLODipine (NORVASC) 10 MG tablet Take 1 tablet (10 mg total) by mouth daily. 30 tablet 0  . carvedilol (COREG) 25 MG tablet Take 1 tablet (25 mg total) by mouth 2 (two) times daily with a meal. 60 tablet 0  . hydrALAZINE (APRESOLINE) 100 MG tablet Take 1 tablet (100 mg total) by mouth every 8 (eight) hours. 90 tablet 0  . traZODone (DESYREL) 50 MG tablet Take 0.5-1 tablets (25-50 mg total) by mouth at bedtime as needed for sleep. (Patient not taking: Reported on 11/01/2018) 30 tablet 3   No facility-administered medications prior to visit.     Allergies  Allergen Reactions  . Imdur [Isosorbide Nitrate] Other (See Comments)    Headaches   . Tramadol Other (See Comments)    Headaches     ROS Review of Systems  Psychiatric/Behavioral: Positive for sleep disturbance. The patient is nervous/anxious.   All other systems reviewed and are negative.     Objective:    Physical Exam  Constitutional: He is oriented to person, place, and time. He appears well-developed and well-nourished.  HENT:  Head: Normocephalic.  Cardiovascular: Normal rate and regular rhythm.  Pulmonary/Chest: Effort normal and breath sounds normal.  Abdominal: Soft. Bowel sounds are normal. He exhibits distension.  Musculoskeletal: Normal range of motion.  Neurological: He is oriented to person, place, and time.  Skin: Skin is warm and dry.  Psychiatric: He has a  normal mood and affect.    BP (!) 127/49 (BP Location: Left Arm, Patient Position: Sitting, Cuff Size: Normal)   Pulse 69   Temp 98 F (36.7 C) (Oral)   Ht 5\' 11"  (1.803 m)   Wt 157 lb (71.2 kg)   SpO2 98%   BMI 21.90 kg/m  Wt Readings from Last 3 Encounters:  11/01/18 157 lb (71.2 kg)  10/25/18 154 lb 3.2 oz (69.9 kg)  10/04/18 158 lb (71.7 kg)     Health Maintenance Due  Topic Date Due  . INFLUENZA VACCINE  10/15/2018    There are no preventive care reminders to display for this patient.  Lab Results  Component Value Date   TSH 1.490 06/22/2017   Lab Results  Component Value Date   WBC 4.6 09/27/2018   HGB 13.1 09/27/2018   HCT 39.5 09/27/2018   MCV 95.2 09/27/2018   PLT 132 (L) 09/27/2018   Lab Results  Component Value Date   NA 137 09/28/2018   K 3.7 09/28/2018   CO2 23 09/28/2018   GLUCOSE 92 09/28/2018   BUN 21 (H) 09/28/2018   CREATININE 2.22 (H) 09/28/2018   BILITOT 0.8 12/02/2017   ALKPHOS 83 12/02/2017   AST 18 12/02/2017   ALT 10 12/02/2017   PROT 6.6 12/02/2017   ALBUMIN 3.4 (L) 12/02/2017   CALCIUM 9.1 09/28/2018   ANIONGAP 9 09/28/2018   GFR 66.49 08/14/2014   Lab Results  Component Value Date   CHOL 165 07/27/2018   Lab Results  Component Value Date   HDL 53 07/27/2018   Lab Results  Component Value Date   LDLCALC 91 07/27/2018   Lab Results  Component Value Date   TRIG 107 07/27/2018   Lab Results  Component Value Date   CHOLHDL 3.1 07/27/2018   Lab Results  Component Value Date   HGBA1C 5.3 03/04/2016      Assessment & Plan:   Problem List Items Addressed This Visit    Tobacco use (Chronic)   Anxiety and depression - Primary   Relevant Medications   buPROPion (WELLBUTRIN XL) 150 MG 24 hr tablet    Other Visit Diagnoses    Primary insomnia         [place] was seen today for follow-up.  Diagnoses and all orders for this visit:  Anxiety and depression  We discussed options for treatment of anxiety including  therapy and/or medication.  Will check basic labs to ensure thyroid is in normal range and that no other metabolic issues are obvious.  Reviewed concept of anxiety as biochemical imbalance of neurotransmitters and rationale for treatment. Discussed potential risks, expected benefits, possible side effects of the medicine. We also discussed how to take it correctly and dosing instructions. If he has any significant side effects to the medicine, he is to stop it and call for advice.  Instructed patient to contact office or on-call physician promptly should condition worsen or any new symptoms appear.    He was agreeable with this plan.   Primary insomnia Can try melatonin 5mg -15 mg at night for sleep, can also do benadryl 25-50mg  at night for sleep.  If  this does not help we can try prescription medication.  Also here is some information about good sleep hygiene.   Insomnia Insomnia is frequent trouble falling and/or staying asleep. Insomnia can be a long term problem or a short term problem. Both are common. Insomnia can be a short term problem when the wakefulness is related to a certain stress or worry. Long term insomnia is often related to ongoing stress during waking hours and/or poor sleeping habits. Overtime, sleep deprivation itself can make the problem worse. Every little thing feels more severe because you are overtired and your ability to cope is decreased. CAUSES   Stress, anxiety, and depression.  Poor sleeping habits.  Distractions such as TV in the bedroom.  Naps close to bedtime.  Engaging in emotionally charged conversations before bed.  Technical reading before sleep.  Alcohol and other sedatives. They may make the problem worse. They can hurt normal sleep patterns and normal dream activity.  Stimulants such as caffeine for several hours prior to bedtime.  Pain syndromes and shortness of breath can cause insomnia.  Exercise late at night.  Changing time zones  may cause sleeping problems (jet lag). It is sometimes helpful to have someone observe your sleeping patterns. They should look for periods of not breathing during the night (sleep apnea). They should also look to see how long those periods last. If you live alone or observers are uncertain, you can also be observed at a sleep clinic where your sleep patterns will be professionally monitored. Sleep apnea requires a checkup and treatment. Give your caregivers your medical history. Give your caregivers observations your family has made about your sleep.  SYMPTOMS   Not feeling rested in the morning.  Anxiety and restlessness at bedtime.  Difficulty falling and staying asleep. TREATMENT   Your caregiver may prescribe treatment for an underlying medical disorders. Your caregiver can give advice or help if you are using alcohol or other drugs for self-medication. Treatment of underlying problems will usually eliminate insomnia problems.  Medications can be prescribed for short time use. They are generally not recommended for lengthy use.  Over-the-counter sleep medicines are not recommended for lengthy use. They can be habit forming.  You can promote easier sleeping by making lifestyle changes such as:  Using relaxation techniques that help with breathing and reduce muscle tension.  Exercising earlier in the day.  Changing your diet and the time of your last meal. No night time snacks.  Establish a regular time to go to bed.  Counseling can help with stressful problems and worry.  Soothing music and white noise may be helpful if there are background noises you cannot remove.  Stop tedious detailed work at least one hour before bedtime. HOME CARE INSTRUCTIONS   Keep a diary. Inform your caregiver about your progress. This includes any medication side effects. See your caregiver regularly. Take note of:  Times when you are asleep.  Times when you are awake during the night.  The  quality of your sleep.  How you feel the next day. This information will help your caregiver care for you.  Get out of bed if you are still awake after 15 minutes. Read or do some quiet activity. Keep the lights down. Wait until you feel sleepy and go back to bed.  Keep regular sleeping and waking hours. Avoid naps.  Exercise regularly.  Avoid distractions at bedtime. Distractions include watching television or engaging in any intense or detailed activity like attempting to balance the  household checkbook.  Develop a bedtime ritual. Keep a familiar routine of bathing, brushing your teeth, climbing into bed at the same time each night, listening to soothing music. Routines increase the success of falling to sleep faster.  Use relaxation techniques. This can be using breathing and muscle tension release routines. It can also include visualizing peaceful scenes. You can also help control troubling or intruding thoughts by keeping your mind occupied with boring or repetitive thoughts like the old concept of counting sheep. You can make it more creative like imagining planting one beautiful flower after another in your backyard garden.  During your day, work to eliminate stress. When this is not possible use some of the previous suggestions to help reduce the anxiety that accompanies stressful situations. MAKE SURE YOU:   Understand these instructions.  Will watch your condition.  Will get help right away if you are not doing well or get worse. Document Released: 02/28/2000 Document Revised: 05/25/2011 Document Reviewed: 03/30/2007 [facility] Patient Information 2015 [place], [place]. This information is not intended to replace advice given to you by your health care provider. Make sure you discuss any questions you have with your health care provider.  Tobacco use Nicotine affect every organ in the body second leading cause of death.  Increased risk for lung cancer and other respiratory  diseases recommend cessation.  This will be reminded at each clinical visit.     No orders of the defined types were placed in this encounter.   Follow-up: Return in about 4 weeks (around 11/29/2018) for follow up on smoking cessation and sleep.    Kerin Perna, NP

## 2018-11-03 ENCOUNTER — Ambulatory Visit: Payer: Medicare HMO | Attending: Adult Health | Admitting: Physical Therapy

## 2018-11-03 DIAGNOSIS — R29818 Other symptoms and signs involving the nervous system: Secondary | ICD-10-CM | POA: Insufficient documentation

## 2018-11-03 DIAGNOSIS — R2681 Unsteadiness on feet: Secondary | ICD-10-CM | POA: Insufficient documentation

## 2018-11-03 DIAGNOSIS — R208 Other disturbances of skin sensation: Secondary | ICD-10-CM | POA: Insufficient documentation

## 2018-11-07 ENCOUNTER — Other Ambulatory Visit: Payer: Self-pay

## 2018-11-07 ENCOUNTER — Ambulatory Visit: Payer: Medicare HMO | Admitting: Physical Therapy

## 2018-11-07 ENCOUNTER — Encounter: Payer: Self-pay | Admitting: Physical Therapy

## 2018-11-07 VITALS — BP 168/98 | HR 81

## 2018-11-07 DIAGNOSIS — R29818 Other symptoms and signs involving the nervous system: Secondary | ICD-10-CM | POA: Diagnosis present

## 2018-11-07 DIAGNOSIS — R2681 Unsteadiness on feet: Secondary | ICD-10-CM | POA: Diagnosis not present

## 2018-11-07 DIAGNOSIS — R208 Other disturbances of skin sensation: Secondary | ICD-10-CM

## 2018-11-07 NOTE — Patient Instructions (Signed)
Warning Signs of a Stroke  A stroke is a medical emergency and should be treated right away-every second counts. A stroke is caused by a decrease or block in blood flow to the brain. When this occurs, certain areas of the brain do not get enough oxygen, and brain cells begin to die. A stroke can lead to brain damage and can sometimes be life-threatening. However, if someone having a stroke gets medical treatment right away, he or she has better chances of surviving and recovering from the stroke. Being able to recognize the symptoms of a stroke is very important. Types of strokes There are two main types of strokes:  Ischemic strokes. This is the most common type of stroke. These strokes happen when a blood vessel that supplies blood to the brain is being blocked.  Hemorrhagic strokes. These strokes result from bleeding in the brain due to a blood vessel leaking or bursting (rupturing). A transient ischemic attack (TIA) is a "warning stroke" that causes stroke-like symptoms that go away quickly. Unlike a stroke, a TIA does not cause permanent damage to the brain. However, the symptoms of a TIA are the same as a stroke, and they also require medical treatment right away. Having a TIA is a sign that you are at higher risk for a permanent stroke. Warning signs of a stroke The symptoms of stroke may vary and will reflect the part of the brain that is involved. Symptoms usually happen suddenly. "BE FAST" is an easy way to remember the main warning signs of a stroke. B - Balance Signs are dizziness, sudden trouble walking, or loss of balance. E - Eyes Signs are trouble seeing or a sudden change in vision. F - Face Signs are sudden weakness or numbness of the face, or the face or eyelid drooping on one side. A - Arms Signs are weakness or numbness in an arm. This happens suddenly and usually on one side of the body. S - Speech Signs are sudden trouble speaking, slurred speech, or trouble understanding  what people say. T - Time Time to call emergency services. Write down what time symptoms started. Other signs of a stroke Some less common signs of a stroke include:  A sudden, severe headache with no known cause.  Nausea or vomiting.  Seizure. A stroke may be happening even if only one "BE FAST" symptoms is present. These symptoms may represent a serious problem that is an emergency. Do not wait to see if the symptoms will go away. Get medical help right away. Call your local emergency services (911 in the [place] Do not drive yourself to the hospital. Summary  A stroke is a medical emergency and should be treated right away-every second counts.  "BE FAST" is an easy way to remember the main warning signs of a stroke.  Call local emergency services right away if you or someone else has any stroke symptoms, even if the symptoms go away.  Make note of what time the first symptoms appeared. Emergency responders or emergency room staff will need to know this information.  Do not wait to see if symptoms will go away. Call 911 even if only one of the "BE FAST" symptoms appears. This information is not intended to replace advice given to you by your health care provider. Make sure you discuss any questions you have with your health care provider. Document Released: 06/19/2016 Document Revised: 02/12/2017 Document Reviewed: 06/19/2016 [employer]vier Patient Education  2020 [employer]  

## 2018-11-08 NOTE — Therapy (Signed)
[name] [address] [place], [place], [address] Phone: [phone]   Fax:  [phone]  Physical Therapy Evaluation  Patient Details  Name: [name] MRN: [id number] Date of Birth: 12-30-1970 Referring Provider (PT): Claris Gower, NP   Encounter Date: 11/07/2018  PT End of Session - 11/08/18 1246    Visit Number  1    Number of Visits  6    Date for PT Re-Evaluation  12/27/18    Authorization Type  [employer] and Medicaid    PT Start Time  7793    PT Stop Time  1443    PT Time Calculation (min)  45 min    Activity Tolerance  Patient tolerated treatment well;Treatment limited secondary to medical complications (Comment)   eval limited secondary to high BP   Behavior During Therapy  [facility] for tasks assessed/performed       Past Medical History:  Diagnosis Date  . Adenomatous colon polyp 08/2015  . Anxiety   . Aortic disease ([place])   . Aortic dissection (H[facility])    a. admx 04/2014 >> L renal infarct; a/c renal failure >> b.  s/p Bioprosthetic [place] and total arch replacement and staged endovascular repair of descending aortic aneurysm ([facility] - Dr. [name])  . CAD (coronary artery disease)    a. LHC 4/16:  oD1 60%  . Cardiomyopathy ([place])    a. non-ischemic - probably related to untreated HTN and ETOH abuse - Echo 3/13 with EF 35-40% >> b. Echo 4/16: Severe LVH, EF 55-60%, moderate AI, moderate MR, mild LAE, trivial effusion, known type B dissection with communication between true and false lumens with suprasternal images suggesting dissection plane may propagate to at least left subclavian takeoff, root above aortic valve okay    . Chronic abdominal pain   . Chronic combined systolic and diastolic congestive heart failure ([place])    a. 05/2011: Adm with pulm edema/HTN urgency, EF 35-40% with diffuse hypokinesis and moderate to severe mitral regurgitation. Cardiomyopathy likely due to uncontrolled HTN and ETOH abuse - cath  deferred due to renal insufficiency (felt due to uncontrolled HTN). bJodie Echevaria MV 06/2011: EF 37% and no ischemia or infarction. c. EF 45-50% by echo 01/2012.  [name] Chronic sinusitis   . CKD (chronic kidney disease)    a. 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  peak creatinine 3.46 during admx for aortic dissection 2/16.  sees Dr [name]  . DDD (degenerative disc disease), lumbar   . Depression   . Descending thoracic aortic aneurysm ([place])   . Dissecting aneurysm of thoracic aorta ([place])   . ETOH abuse    a. Reported to have quit 05/2011.  . Frequent headaches   . GERD (gastroesophageal reflux disease)   . Headache(784.0)    "q other day" (08/08/2013)  . Heart murmur   . Hemorrhoid thrombosis   . History of echocardiogram    Echo 1/17:  Severe LVH, EF 55-60%, no RWMA, Gr 2 DD, AVR ok, mild to mod MR, mild LAE, mild reduced RVSF, mod RAE  . History of medication noncompliance   . HYPERLIPIDEMIA   . Hypertension    a. Hx of HTN urgency secondary to noncompliance. b. urinary metanephrine and catecholeamine levels normal 2013.  c. Renal art [name] 1/16:  No evidence of renal artery stenosis noted bilaterally.  . INGUINAL HERNIA   . Pneumonia ~ 2013  . Renal insufficiency   . Serrated adenoma of colon 08/2015  . Stroke ([place])   .  Tobacco abuse   . Valvular heart disease    a. Echo 05/2011: moderate to severe eccentric MR and mild to moderate AI with prolapsing left coronary cusp. b. Echo 01/2012: mild-mod AI, mild dilitation of aortic root, mild MR.;  c. Echo 1/16: Severe LVH consistent with hypertrophic cardio myopathy, EF 50%, no RWMA, mod AI, mild MR, mild RAE, dilated Ao root (40 mm);     Past Surgical History:  Procedure Laterality Date  . ANKLE SURGERY Bilateral    Fractures bilaterally  . AORTIC VALVE SURGERY  09/2014  . CORONARY ANGIOGRAPHY N/A 12/06/2017   Procedure: CORONARY ANGIOGRAPHY;  Surgeon: Belva Crome, MD;  Location: [facility];  Service: Cardiovascular;  Laterality: N/A;  .  CORONARY ANGIOGRAPHY N/A 09/26/2018   Procedure: CORONARY ANGIOGRAPHY;  Surgeon: Nelva Bush, MD;  Location: [facility];  Service: Cardiovascular;  Laterality: N/A;  . CORONARY STENT INTERVENTION N/A 12/10/2017   Procedure: CORONARY STENT INTERVENTION;  Surgeon: [name] Sine, MD;  Location: [facility];  Service: Cardiovascular;  Laterality: N/A;  . FOOT FRACTURE SURGERY Bilateral 2004-2010   "got pins in both of them"  . HEMORRHOID SURGERY N/A 06/15/2015   Procedure: HEMORRHOIDECTOMY;  Surgeon: Stark Klein, MD;  Location: [facility];  Service: General;  Laterality: N/A;  . INGUINAL HERNIA REPAIR Right ~ 1996  . LEFT HEART CATHETERIZATION WITH CORONARY ANGIOGRAM N/A 06/21/2014   Procedure: LEFT HEART CATHETERIZATION WITH CORONARY ANGIOGRAM;  Surgeon: Larey Dresser, MD;  Location: [facility];  Service: Cardiovascular;  Laterality: N/A;  . LOOP RECORDER INSERTION N/A 06/19/2016   Procedure: Loop Recorder Insertion;  Surgeon: Deboraha Sprang, MD;  Location: [facility];  Service: Cardiovascular;  Laterality: N/A;  . TEE WITHOUT CARDIOVERSION N/A 03/12/2016   Procedure: TRANSESOPHAGEAL ECHOCARDIOGRAM (TEE);  Surgeon: Sanda Klein, MD;  Location: [facility] ENDOSCOPY;  Service: Cardiovascular;  Laterality: N/A;    Vitals:   11/07/18 1401 11/07/18 1421  BP: (!) 164/101 (!) 168/98  Pulse: 85 81     Subjective Assessment - 11/07/18 1403    Subjective  Forgot to take some of his BP medications prior to coming into therapy - pt asymptomatic. Normally is good about taking his BP. Had prior stroke in 02/2016 that affected his R side. Reports having difficulty feeling with his R hand. States that his R leg is not as strong as the left. Has difficulties perceiving between hot and cold in the shower on RLE. Reports feeling fatigued all of the time. Has noticed he occassionally has to catch himself when he stands up. Had an almost fall where he felt his R leg got caught. States that he works  out constantly.    Pertinent History  NSTEMI 09/2018, hx of CVA 2017 with R residual deficits, aortic dissection status post endoluminal stent grafting 2019,  HTN, CAD, HLD, coronary artery disease, nonischemic cardiomyopathy, heart failure, chronic kidney disease and tobacco abuse.    Patient Stated Goals  wants to not have to worry about slipping or falling         [facility] PT Assessment - 11/07/18 1354      Assessment   Medical Diagnosis  CVA    from 02/2016, worsening R paresthesias and weakness   Referring Provider (PT)  Claris Gower, NP    Onset Date/Surgical Date  03/08/16    Hand Dominance  Right   but ambidexturous   Prior Therapy  prior at this clinic for OT and ST. only  saw PT for eval      Precautions   Precautions  Fall   high BP     Balance Screen   Has the patient fallen in the past 6 months  No   had an almost fall   Has the patient had a decrease in activity level because of a fear of falling?   No    Is the patient reluctant to leave their home because of a fear of falling?   No      Home Social worker  Private residence    Living Arrangements  Spouse/significant other;Children   daughter who is 33   Type of [place] Access  Level entry    [place]  One level    Additional Comments  Has 4 steps with no railings to get into shed      Prior Function   Level of Independence  Independent    Vocation Requirements  used to be a Engineering geologist     Leisure  likes to workout at home - bench press,lifts weights        Sensation   Light Touch  Impaired Detail    Light Touch Impaired Details  Impaired RLE    Hot/Cold  Impaired Detail    Hot/Cold Impaired Details  Impaired RUE;Impaired RLE    Additional Comments  pt states he has difficulty distinguishing between hot and cold on RLE/RUE in shower - pt can feel light touch RLE - but it feels different than L and touch is not as localized      Coordination   Gross Motor  Movements are Fluid and Coordinated  No    Coordination and Movement Description  mildly dysmetric RLE      ROM / Strength   AROM / PROM / Strength  Strength      Strength   Overall Strength Comments  all other strength grossly 5/5 between R and LLE    Strength Assessment Site  Hip    Right/Left Hip  Right;Left    Right Hip Flexion  4+/5    Right/Left Knee  --    Right/Left Ankle  --      Ambulation/Gait   Ambulation/Gait  Yes    Ambulation/Gait Assistance  5: Supervision    Ambulation Distance (Feet)  --   clinic distances for eval   Assistive device  None    Ambulation Surface  Level;Indoor    Gait Comments  will further assess gait pattern at next visit      Functional Gait  Assessment   Gait assessed   Yes    Gait Level Surface  Walks 20 ft in less than 5.5 sec, no assistive devices, good speed, no evidence for imbalance, normal gait pattern, deviates no more than 6 in outside of the 12 in walkway width.   5.45    Change in Gait Speed  Able to smoothly change walking speed without loss of balance or gait deviation. Deviate no more than 6 in outside of the 12 in walkway width.    Gait with Horizontal Head Turns  Performs head turns smoothly with slight change in gait velocity (eg, minor disruption to smooth gait path), deviates 6-10 in outside 12 in walkway width, or uses an assistive device.    Gait with Vertical Head Turns  Performs task with slight change in gait velocity (eg, minor disruption to smooth gait path), deviates 6 - 10 in outside  12 in walkway width or uses assistive device    Gait and Pivot Turn  Pivot turns safely within 3 sec and stops quickly with no loss of balance.    Step Over Obstacle  Is able to step over 2 stacked shoe boxes taped together (9 in total height) without changing gait speed. No evidence of imbalance.    Gait with Narrow Base of Support  Ambulates less than 4 steps heel to toe or cannot perform without assistance.    Gait with Eyes Closed   Walks 20 ft, slow speed, abnormal gait pattern, evidence for imbalance, deviates 10-15 in outside 12 in walkway width. Requires more than 9 sec to ambulate 20 ft.   11.2 seconds   Ambulating Backwards  Walks 20 ft, slow speed, abnormal gait pattern, evidence for imbalance, deviates 10-15 in outside 12 in walkway width.   17.55 seconds   Steps  Alternating feet, no rail.    Total Score  21    FGA comment:  21/30                Objective measurements completed on examination: See above findings.              PT Education - 11/08/18 1242    Education Details  POC, clinical findings, warning signs and symptoms of a stroke handout, importance of taking BP medication daily and BP contraindications for therapy    Person(s) Educated  Patient    Methods  Explanation;Handout    Comprehension  Verbalized understanding       PT Short Term Goals - 11/08/18 1250      PT SHORT TERM GOAL #1   Title  Patient will be independent with initial HEP in order to build upon functional gains in therapy.. ALL STGS DUE 12/06/18    Time  4    Period  Weeks    Status  New    Target Date  12/06/18      PT SHORT TERM GOAL #2   Title  Patient will verbalize understanding of risks factors and warning signs/symptoms of a stroke.    Status  New      PT SHORT TERM GOAL #3   Title  Patient will improve FGA score to at least a 24/30 in order to decrease risk of falls.    Baseline  21/30 on 11/07/18        PT Long Term Goals - 11/08/18 1252      PT LONG TERM GOAL #1   Title  Patient will be independent with final HEP in order to build upon functional gains made in therapy. ALL LTGS DUE 12/27/18.    Time  7    Period  Weeks    Status  New    Target Date  12/27/18      PT LONG TERM GOAL #2   Title  Patient will improve FGA score to at least a 27/30 in order to decrease risk of falls.      PT LONG TERM GOAL #3   Title  Patient will perform 4 steps with no handrail with step through  pattern and mod I in order to safely enter/exit his shed in his backyard.    Baseline  Not yet assessed      PT LONG TERM GOAL #4   Title  Make goal for 3 or 6MWT for endurance.    Baseline  not yet assesssed      PT LONG TERM GOAL #5  Title  Patient will ambulate at least 1000' outdoors over unlevel surfaces while scanning environment with mod I in order to improve community mobility.    Baseline  not yet assessed.             Plan - 11/08/18 1246    Clinical Impression Statement  Patient is a 48 year old male referred to Neuro OPPT for evaluation with primary concern of RLE weakness/balance deficits (more recent episode) had a CVA 02/2016.  [name] PMH is significant for: NSTEMI 09/2018, hx of CVA, aortic dissection status post endoluminal stent grafting 2019, HTN, CAD, HLD, coronary artery disease, nonischemic cardiomyopathy, heart failure, chronic kidney disease and tobacco abuse.  The following deficits were present during the exam: impaired dynamic balance with head turns, tandem stance, and eyes closed. Limited eval performed today due to pt's high BP after not taking medication (see vitals) - pt asymptomatic. [name] FGA scores indicate pt is at a medium risk for falls.  Pt would benefit from skilled PT to address these impairments and functional limitations to maximize functional mobility independence.    Personal Factors and Comorbidities  Past/Current Experience;Comorbidity 3+    Comorbidities  NSTEMI 09/2018, hx of CVA (2017), aortic dissection status post endoluminal stent grafting 2019, HTN, CAD, HLD, coronary artery disease, nonischemic cardiomyopathy, heart failure, chronic kidney disease and tobacco abuse    Examination-Activity Limitations  Locomotion Level;Stairs    Stability/Clinical Decision Making  Evolving/Moderate complexity    Clinical Decision Making  Moderate    Rehab Potential  Good    PT Frequency  1x / week    PT Duration  6 weeks    PT Treatment/Interventions   ADLs/Self Care Home Management;Gait training;Stair training;Functional mobility training;Therapeutic activities;Therapeutic exercise;Balance training;Patient/family education;Neuromuscular re-education    PT Next Visit Plan  check BP at beginning/during every session. 5x sit to stand to assess LE strength, initial HEP for balance. 3 minute or 6 minute walk test when pt has taken BP meds    Consulted and Agree with Plan of Care  Patient       Patient will benefit from skilled therapeutic intervention in order to improve the following deficits and impairments:  Decreased activity tolerance, Decreased balance, Difficulty walking, Decreased strength, Impaired sensation, Decreased coordination  Visit Diagnosis: Unsteadiness on feet  Other disturbances of skin sensation  Other symptoms and signs involving the nervous system     Problem List Patient Active Problem List   Diagnosis Date Noted  . Chest pain, rule out acute myocardial infarction 09/24/2018  . Chronic systolic (congestive) heart failure ([place]) 04/25/2018  . Acute on chronic combined systolic and diastolic CHF (congestive heart failure) ([place]) 12/08/2017  . Coronary artery disease involving native coronary artery of native heart with angina pectoris ([place]) 12/06/2017  . NSTEMI (non-ST elevated myocardial infarction) ([place]) 12/02/2017  . Hypertensive emergency - Accelerated Hypertension 12/02/2017  . History of stroke 10/05/2017  . Somnolence, daytime 04/07/2017  . Snoring 04/07/2017  . 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 History of aortic dissection 06/03/2016  . Palpitation   . Cerebrovascular accident (CVA) due to embolism of left middle cerebral artery ([place])   . Seizures ([place]) 02/28/2016  . HLD (hyperlipidemia) 08/28/2015  . 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[place]) 06/07/2015  . Cardiomyopathy ([place]) 03/01/2015  . Acid reflux 03/01/2015  . Essential hypertension 03/01/2015   . S/P aortic dissection repair 10/03/2014  . H/O aortic valve replacement 10/03/2014  . CKD (chronic kidney  disease) stage 3, GFR 30-59 ml/min (HCC) 05/15/2014  . AKI (acute kidney injury) ([place])   . Dissecting aneurysm of thoracic aorta, [place] type B ([place]) 04/24/2014  . Aortic dissection ([place]) 04/23/2014  . Aneurysm of ascending aorta (HCC) 03/28/2014  . Dyspnea 03/27/2014  . ETOH abuse 09/20/2013  . Malignant hypertension 02/20/2013  . Hypertensive urgency 01/27/2012  . Cardiomyopathy, hypertensive ([place]) 01/26/2012  . AI (aortic insufficiency) 01/26/2012  . MR (mitral regurgitation) 01/26/2012  . Acute renal failure superimposed on stage 3 chronic kidney disease ([place]) 01/26/2012  . Hypertensive cardiomyopathy, without heart failure ([place]): EF reduced to 35-40% from 55%. 06/10/2011  . Chronic bronchitis 05/23/2011  . Cardiomegaly - hypertensive 05/22/2011  . Marijuana use 05/22/2011  . Abdominal pain 05/22/2011  . Hypertension, malignant 05/22/2011  . Anxiety and depression 05/22/2011  . Hyperlipidemia LDL goal <70 08/26/2009  . Tobacco use 08/26/2009  . Headache(784.0) 08/26/2009  . Aortic valve disorder 03/11/2009  . INGUINAL HERNIA 02/18/2009  . Uncontrolled hypertension 01/16/2009    Arliss Journey, PT, DPT 11/08/2018, 1:07 PM  [facility] [address] [place], [place], [address] Phone: [phone]   Fax:  [phone]  Name: [name] MRN: [id number] Date of Birth: May 24, 1970

## 2018-11-11 NOTE — BH Specialist Note (Signed)
Integrated Behavioral Health Follow Up Visit  MRN: [id number] Name: [name]  Number of [place] Clinician visits: 2/6 Session Start time: 9:00 AM  Session End time: 9:15 AM Total time: 20 minutes  Type of Service: [place] Interpretor:No. Interpretor Name and Language: na  SUBJECTIVE: [name] is a 48 y.o. male accompanied by self Patient was referred by NP Oletta Lamas for depression and anxiety. Patient reports the following symptoms/concerns: Pt reports difficulty managing chronic health conditions and chronic pain. He is open to medication management to assist with sleep hygiene; however, he has experienced side effects to medications Duration of problem: Ongoing; Severity of problem: moderate  OBJECTIVE: Mood: Appropriate and Affect: Appropriate Risk of harm to self or others: No plan to harm self or others  LIFE CONTEXT: Family and Social: Pt receives strong support from family and friends School/Work: Pt receives disability  Self-Care: Pt reports inability to do the things he loves due to chronic pain. He has been open to medication management to assist in decrease of symptoms Life Changes: Pt reports difficulty managing chronic health conditions and chronic pain  GOALS ADDRESSED: Patient will: 1.  Reduce symptoms of: anxiety and depression  2.  Increase knowledge and/or ability of: self-management skills  3.  Demonstrate ability to: Increase healthy adjustment to current life circumstances  INTERVENTIONS: Interventions utilized:  Supportive Counseling Standardized Assessments completed: GAD-7 and PHQ 2&9  ASSESSMENT: Patient has difficulty managing chronic health conditions and chronic pain. He is open to medication management to assist with sleep hygiene; however, reports side effects to prescribed medications. He is open to a new prescription and will further discuss with PCP.   Pt is continuing to utilize  healthy coping skills and reach out to support system to assist with the management of depression and anxiety. No additional concerns noted.  PLAN: 1. Follow up with behavioral health clinician on : Schedule a follow up appointment, if needed 2. Behavioral recommendations: LCSW recommends that patient continue to comply with medication management and utilize healthy coping skills 3. Referral(s): [facility] (In Clinic) 4. "From scale of 1-10, how likely are you to follow plan?": [address]  Rebekah Chesterfield, LCSW 11/11/2018 10:32 AM

## 2018-11-14 ENCOUNTER — Other Ambulatory Visit: Payer: Self-pay

## 2018-11-14 ENCOUNTER — Encounter: Payer: Self-pay | Admitting: Physical Therapy

## 2018-11-14 ENCOUNTER — Ambulatory Visit: Payer: Medicare HMO | Admitting: Physical Therapy

## 2018-11-14 VITALS — BP 148/91 | HR 76

## 2018-11-14 DIAGNOSIS — R2681 Unsteadiness on feet: Secondary | ICD-10-CM

## 2018-11-14 DIAGNOSIS — R29818 Other symptoms and signs involving the nervous system: Secondary | ICD-10-CM

## 2018-11-14 DIAGNOSIS — R208 Other disturbances of skin sensation: Secondary | ICD-10-CM

## 2018-11-14 NOTE — Therapy (Signed)
[facility] [address] [place], [place], [address] Phone: [phone]   Fax:  [phone]  Physical Therapy Treatment  Patient Details  Name: LUNDEN MCLEISH MRN: [id number] Date of Birth: 03-Feb-1971 Referring Provider (PT): Claris Gower, NP   Encounter Date: 11/14/2018  PT End of Session - 11/14/18 3810    Visit Number  2    Number of Visits  6    Date for PT Re-Evaluation  12/27/18    Authorization Type  [employer] and Medicaid    PT Start Time  [phone]    PT Stop Time  0929    PT Time Calculation (min)  43 min    Activity Tolerance  Patient tolerated treatment well   eval limited secondary to high BP   Behavior During Therapy  [facility] for tasks assessed/performed       Past Medical History:  Diagnosis Date  . Adenomatous colon polyp 08/2015  . Anxiety   . Aortic disease ([place])   . Aortic dissection (HCC)    a. admx 04/2014 >> L renal infarct; a/c renal failure >> b.  s/p Bioprosthetic Bentall and total arch replacement and staged endovascular repair of descending aortic aneurysm ([facility] - Dr. [name])  . CAD (coronary artery disease)    a. LHC 4/16:  oD1 60%  . Cardiomyopathy ([place])    a. non-ischemic - probably related to untreated HTN and ETOH abuse - Echo 3/13 with EF 35-40% >> b. Echo 4/16: Severe LVH, EF 55-60%, moderate AI, moderate MR, mild LAE, trivial effusion, known type B dissection with communication between true and false lumens with suprasternal images suggesting dissection plane may propagate to at least left subclavian takeoff, root above aortic valve okay    . Chronic abdominal pain   . Chronic combined systolic and diastolic congestive heart failure ([place])    a. 05/2011: Adm with pulm edema/HTN urgency, EF 35-40% with diffuse hypokinesis and moderate to severe mitral regurgitation. Cardiomyopathy likely due to uncontrolled HTN and ETOH abuse - cath deferred due to renal insufficiency (felt due to uncontrolled  HTN). bJodie Echevaria MV 06/2011: EF 37% and no ischemia or infarction. c. EF 45-50% by echo 01/2012.  [name] Chronic sinusitis   . CKD (chronic kidney disease)    a. 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  peak creatinine 3.46 during admx for aortic dissection 2/16.  sees Dr [name]  . DDD (degenerative disc disease), lumbar   . Depression   . Descending thoracic aortic aneurysm ([place])   . Dissecting aneurysm of thoracic aorta ([place])   . ETOH abuse    a. Reported to have quit 05/2011.  . Frequent headaches   . GERD (gastroesophageal reflux disease)   . Headache(784.0)    "q other day" (08/08/2013)  . Heart murmur   . Hemorrhoid thrombosis   . History of echocardiogram    Echo 1/17:  Severe LVH, EF 55-60%, no RWMA, Gr 2 DD, AVR ok, mild to mod MR, mild LAE, mild reduced RVSF, mod RAE  . History of medication noncompliance   . HYPERLIPIDEMIA   . Hypertension    a. Hx of HTN urgency secondary to noncompliance. b. urinary metanephrine and catecholeamine levels normal 2013.  c. Renal art [name] 1/16:  No evidence of renal artery stenosis noted bilaterally.  . INGUINAL HERNIA   . Pneumonia ~ 2013  . Renal insufficiency   . Serrated adenoma of colon 08/2015  . Stroke ([place])   . Tobacco abuse   .  Valvular heart disease    a. Echo 05/2011: moderate to severe eccentric MR and mild to moderate AI with prolapsing left coronary cusp. b. Echo 01/2012: mild-mod AI, mild dilitation of aortic root, mild MR.;  c. Echo 1/16: Severe LVH consistent with hypertrophic cardio myopathy, EF 50%, no RWMA, mod AI, mild MR, mild RAE, dilated Ao root (40 mm);     Past Surgical History:  Procedure Laterality Date  . ANKLE SURGERY Bilateral    Fractures bilaterally  . AORTIC VALVE SURGERY  09/2014  . CORONARY ANGIOGRAPHY N/A 12/06/2017   Procedure: CORONARY ANGIOGRAPHY;  Surgeon: Belva Crome, MD;  Location: [facility];  Service: Cardiovascular;  Laterality: N/A;  . CORONARY ANGIOGRAPHY N/A 09/26/2018   Procedure: CORONARY  ANGIOGRAPHY;  Surgeon: Nelva Bush, MD;  Location: [facility];  Service: Cardiovascular;  Laterality: N/A;  . CORONARY STENT INTERVENTION N/A 12/10/2017   Procedure: CORONARY STENT INTERVENTION;  Surgeon: [name] Sine, MD;  Location: [facility];  Service: Cardiovascular;  Laterality: N/A;  . FOOT FRACTURE SURGERY Bilateral 2004-2010   "got pins in both of them"  . HEMORRHOID SURGERY N/A 06/15/2015   Procedure: HEMORRHOIDECTOMY;  Surgeon: Stark Klein, MD;  Location: [facility];  Service: General;  Laterality: N/A;  . INGUINAL HERNIA REPAIR Right ~ 1996  . LEFT HEART CATHETERIZATION WITH CORONARY ANGIOGRAM N/A 06/21/2014   Procedure: LEFT HEART CATHETERIZATION WITH CORONARY ANGIOGRAM;  Surgeon: Larey Dresser, MD;  Location[facility] CATH LAB;  Service: Cardiovascular;  Laterality: N/A;  . LOOP RECORDER INSERTION N/A 06/19/2016   Procedure: Loop Recorder Insertion;  Surgeon: Deboraha Sprang, MD;  Location: [facility];  Service: Cardiovascular;  Laterality: N/A;  . TEE WITHOUT CARDIOVERSION N/A 03/12/2016   Procedure: TRANSESOPHAGEAL ECHOCARDIOGRAM (TEE);  Surgeon: Sanda Klein, MD;  Location: [facility] ENDOSCOPY;  Service: Cardiovascular;  Laterality: N/A;    Vitals:   11/14/18 0850  BP: (!) 148/91  Pulse: 76    Subjective Assessment - 11/14/18 0848    Subjective  No changes since last visit. Nothing new to report.    Pertinent History  NSTEMI 09/2018, hx of CVA 2017 with R residual deficits, aortic dissection status post endoluminal stent grafting 2019,  HTN, CAD, HLD, coronary artery disease, nonischemic cardiomyopathy, heart failure, chronic kidney disease and tobacco abuse.    Patient Stated Goals  wants to not have to worry about slipping or falling                       [facility] Adult PT Treatment/Exercise - 11/14/18 0001      High Level Balance   High Level Balance Comments  In // bars with intermittent UE support: rocker board A/P weight shifting 1 x10 reps,  followed by 5 x 30 second (attempted 30 second) holds with eyes closed - pt with increased postural sway req min guard, pt with tendency to shift weight forward onto toes           Access Code: [email or web address]  URL: [email or web address]  Date: 11/14/2018  Prepared by: Janann August   Initiated pt's HEP:   Exercises Single Leg Stance on Foam Pad - 3 sets - 20 hold - 2x daily - 7x weekly --> 2 pillows Romberg Stance Eyes Closed on Foam Pad - 10 reps - 3 sets - 2x daily - 7x weekly --> with 1 x 10 horizontal head turns, 1 x 10 vertical head nods  Tandem Stance on Foam Pad  with Eyes Open - 20 reps - 3 sets - 2x daily - 7x weekly --> modified tandem stance with eyes closed  Toe Walking - 3 sets - 2x daily - 7x weekly Heel Walking - 3 sets - 2x daily - 7x weekly Braided Sidestepping - 3 sets - 2x daily - 7x weekly Walking Tandem Stance - 3 sets - 2x daily - 7x weekly     PT Education - 11/14/18 0952    Education Details  initial HEP for balance    Person(s) Educated  Patient    Methods  Explanation;Handout    Comprehension  Verbalized understanding;Returned demonstration       PT Short Term Goals - 11/08/18 1250      PT SHORT TERM GOAL #1   Title  Patient will be independent with initial HEP in order to build upon functional gains in therapy.. ALL STGS DUE 12/06/18    Time  4    Period  Weeks    Status  New    Target Date  12/06/18      PT SHORT TERM GOAL #2   Title  Patient will verbalize understanding of risks factors and warning signs/symptoms of a stroke.    Status  New      PT SHORT TERM GOAL #3   Title  Patient will improve FGA score to at least a 24/30 in order to decrease risk of falls.    Baseline  21/30 on 11/07/18        PT Long Term Goals - 11/08/18 1252      PT LONG TERM GOAL #1   Title  Patient will be independent with final HEP in order to build upon functional gains made in therapy. ALL LTGS DUE 12/27/18.    Time  7    Period  Weeks     Status  New    Target Date  12/27/18      PT LONG TERM GOAL #2   Title  Patient will improve FGA score to at least a 27/30 in order to decrease risk of falls.      PT LONG TERM GOAL #3   Title  Patient will perform 4 steps with no handrail with step through pattern and mod I in order to safely enter/exit his shed in his backyard.    Baseline  Not yet assessed      PT LONG TERM GOAL #4   Title  Make goal for 3 or 6MWT for endurance.    Baseline  not yet assesssed      PT LONG TERM GOAL #5   Title  Patient will ambulate at least 1000' outdoors over unlevel surfaces while scanning environment with mod I in order to improve community mobility.    Baseline  not yet assessed.            Plan - 11/14/18 0952    Clinical Impression Statement  Focus of today's session was initating HEP for balance. Pt tolerated well.  Pt with increased difficulty with SLS on LLE on compliant surfaces. Pt requiring min guard for eyes closed balance activities on rockerboard today with need for intermittent UE support. Will continue to progress towards LTGs.    Personal Factors and Comorbidities  Past/Current Experience;Comorbidity 3+    Comorbidities  NSTEMI 09/2018, hx of CVA (2017), aortic dissection status post endoluminal stent grafting 2019, HTN, CAD, HLD, coronary artery disease, nonischemic cardiomyopathy, heart failure, chronic kidney disease and tobacco abuse    Examination-Activity Limitations  Locomotion Level;Stairs    Stability/Clinical Decision Making  Evolving/Moderate complexity    Rehab Potential  Good    PT Frequency  1x / week    PT Duration  6 weeks    PT Treatment/Interventions  ADLs/Self Care Home Management;Gait training;Stair training;Functional mobility training;Therapeutic activities;Therapeutic exercise;Balance training;Patient/family education;Neuromuscular re-education    PT Next Visit Plan  check BP at beginning/during every session. 3 minute or 6 minute walk test when pt has  taken BP meds. Higher level balance on compliant surfaces with EC, SLS activities.    PT Home Exercise Plan  [email or web address]    Consulted and Agree with Plan of Care  Patient       Patient will benefit from skilled therapeutic intervention in order to improve the following deficits and impairments:  Decreased activity tolerance, Decreased balance, Difficulty walking, Decreased strength, Impaired sensation, Decreased coordination  Visit Diagnosis: Unsteadiness on feet  Other disturbances of skin sensation  Other symptoms and signs involving the nervous system     Problem List Patient Active Problem List   Diagnosis Date Noted  . Chest pain, rule out acute myocardial infarction 09/24/2018  . Chronic systolic (congestive) heart failure ([place]) 04/25/2018  . Acute on chronic combined systolic and diastolic CHF (congestive heart failure) ([place]) 12/08/2017  . Coronary artery disease involving native coronary artery of native heart with angina pectoris ([place]) 12/06/2017  . NSTEMI (non-ST elevated myocardial infarction) ([place]) 12/02/2017  . Hypertensive emergency - Accelerated Hypertension 12/02/2017  . History of stroke 10/05/2017  . Somnolence, daytime 04/07/2017  . Snoring 04/07/2017  . Cerebral infarction due to embolism of left middle cerebral artery ([place]) 06/03/2016  . History of aortic dissection 06/03/2016  . Palpitation   . Cerebrovascular accident (CVA) due to embolism of left middle cerebral artery ([place])   . Seizures ([place]) 02/28/2016  . HLD (hyperlipidemia) 08/28/2015  . 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[place]) 06/07/2015  . Cardiomyopathy ([place]) 03/01/2015  . Acid reflux 03/01/2015  . Essential hypertension 03/01/2015  . S/P aortic dissection repair 10/03/2014  . H/O aortic valve replacement 10/03/2014  . CKD (chronic kidney disease) stage 3, GFR 30-59 ml/min (HCC) 05/15/2014  . AKI (acute kidney injury) ([place])   . Dissecting aneurysm of thoracic aorta, Stanford type B ([place])  04/24/2014  . Aortic dissection ([place]) 04/23/2014  . Aneurysm of ascending aorta (HCC) 03/28/2014  . Dyspnea 03/27/2014  . ETOH abuse 09/20/2013  . Malignant hypertension 02/20/2013  . Hypertensive urgency 01/27/2012  . Cardiomyopathy, hypertensive ([place]) 01/26/2012  . AI (aortic insufficiency) 01/26/2012  . MR (mitral regurgitation) 01/26/2012  . Acute renal failure superimposed on stage 3 chronic kidney disease ([place]) 01/26/2012  . Hypertensive cardiomyopathy, without heart failure ([place]): EF reduced to 35-40% from 55%. 06/10/2011  . Chronic bronchitis 05/23/2011  . Cardiomegaly - hypertensive 05/22/2011  . Marijuana use 05/22/2011  . Abdominal pain 05/22/2011  . Hypertension, malignant 05/22/2011  . Anxiety and depression 05/22/2011  . Hyperlipidemia LDL goal <70 08/26/2009  . Tobacco use 08/26/2009  . Headache(784.0) 08/26/2009  . Aortic valve disorder 03/11/2009  . INGUINAL HERNIA 02/18/2009  . Uncontrolled hypertension 01/16/2009    Arliss Journey, PT, DPT 11/14/2018, 10:01 AM  [facility] [address] [place], [place], [address] Phone: [phone]   Fax:  [phone]  Name: MASSEY RUHLAND MRN: [id number] Date of Birth: [birth year]

## 2018-11-14 NOTE — Patient Instructions (Signed)
Access Code: [email or web address]  URL: [email or web address]  Date: 11/14/2018  Prepared by: Janann August   Exercises Single Leg Stance on Foam Pad - 3 sets - 20 hold - 2x daily - 7x weekly Romberg Stance Eyes Closed on Foam Pad - 10 reps - 3 sets - 2x daily - 7x weekly Tandem Stance on Foam Pad with Eyes Open - 20 reps - 3 sets - 2x daily - 7x weekly Toe Walking - 3 sets - 2x daily - 7x weekly Heel Walking - 3 sets - 2x daily - 7x weekly Braided Sidestepping - 3 sets - 2x daily - 7x weekly Walking Tandem Stance - 3 sets - 2x daily - 7x weekly

## 2018-11-22 ENCOUNTER — Ambulatory Visit: Payer: Medicare HMO | Admitting: Physical Therapy

## 2018-11-28 ENCOUNTER — Other Ambulatory Visit: Payer: Self-pay

## 2018-11-28 ENCOUNTER — Encounter: Payer: Self-pay | Admitting: Physical Therapy

## 2018-11-28 ENCOUNTER — Ambulatory Visit: Payer: Medicare HMO | Admitting: Physical Therapy

## 2018-11-28 VITALS — BP 202/111 | HR 67

## 2018-11-28 DIAGNOSIS — R208 Other disturbances of skin sensation: Secondary | ICD-10-CM | POA: Insufficient documentation

## 2018-11-28 DIAGNOSIS — R29818 Other symptoms and signs involving the nervous system: Secondary | ICD-10-CM | POA: Insufficient documentation

## 2018-11-28 DIAGNOSIS — R2681 Unsteadiness on feet: Secondary | ICD-10-CM | POA: Insufficient documentation

## 2018-11-28 NOTE — Therapy (Signed)
[facility] [address] [place], [place], [address] Phone: [phone]   Fax:  [phone]  Physical Therapy Treatment  Patient Details  Name: [name] MRN: [id number] Date of Birth: [birth year] Referring Provider (PT): Claris Gower, NP   Encounter Date: 11/28/2018    Past Medical History:  Diagnosis Date  . Adenomatous colon polyp 08/2015  . Anxiety   . Aortic disease ([place])   . Aortic dissection (HCC)    a. admx 04/2014 >> L renal infarct; a/c renal failure >> b.  s/p Bioprosthetic Bentall and total arch replacement and staged endovascular repair of descending aortic aneurysm ([facility] - Dr. [name])  . CAD (coronary artery disease)    a. LHC 4/16:  oD1 60%  . Cardiomyopathy ([place])    a. non-ischemic - probably related to untreated HTN and ETOH abuse - Echo 3/13 with EF 35-40% >> b. Echo 4/16: Severe LVH, EF 55-60%, moderate AI, moderate MR, mild LAE, trivial effusion, known type B dissection with communication between true and false lumens with suprasternal images suggesting dissection plane may propagate to at least left subclavian takeoff, root above aortic valve okay    . Chronic abdominal pain   . Chronic combined systolic and diastolic congestive heart failure ([place])    a. 05/2011: Adm with pulm edema/HTN urgency, EF 35-40% with diffuse hypokinesis and moderate to severe mitral regurgitation. Cardiomyopathy likely due to uncontrolled HTN and ETOH abuse - cath deferred due to renal insufficiency (felt due to uncontrolled HTN). bJodie Echevaria MV 06/2011: EF 37% and no ischemia or infarction. c. EF 45-50% by echo 01/2012.  [name] Chronic sinusitis   . CKD (chronic kidney disease)    a. 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  peak creatinine 3.46 during admx for aortic dissection 2/16.  sees Dr [name]  . DDD (degenerative disc disease), lumbar   . Depression   . Descending thoracic aortic aneurysm ([place])   . Dissecting aneurysm of  thoracic aorta ([employer])   . ETOH abuse    a. Reported to have quit 05/2011.  . Frequent headaches   . GERD (gastroesophageal reflux disease)   . Headache(784.0)    "q other day" (08/08/2013)  . Heart murmur   . Hemorrhoid thrombosis   . History of echocardiogram    Echo 1/17:  Severe LVH, EF 55-60%, no RWMA, Gr 2 DD, AVR ok, mild to mod MR, mild LAE, mild reduced RVSF, mod RAE  . History of medication noncompliance   . HYPERLIPIDEMIA   . Hypertension    a. Hx of HTN urgency secondary to noncompliance. b. urinary metanephrine and catecholeamine levels normal 2013.  c. Renal art [name] 1/16:  No evidence of renal artery stenosis noted bilaterally.  . INGUINAL HERNIA   . Pneumonia ~ 2013  . Renal insufficiency   . Serrated adenoma of colon 08/2015  . Stroke ([place])   . Tobacco abuse   . Valvular heart disease    a. Echo 05/2011: moderate to severe eccentric MR and mild to moderate AI with prolapsing left coronary cusp. b. Echo 01/2012: mild-mod AI, mild dilitation of aortic root, mild MR.;  c. Echo 1/16: Severe LVH consistent with hypertrophic cardio myopathy, EF 50%, no RWMA, mod AI, mild MR, mild RAE, dilated Ao root (40 mm);     Past Surgical History:  Procedure Laterality Date  . ANKLE SURGERY Bilateral    Fractures bilaterally  . AORTIC VALVE SURGERY  09/2014  . CORONARY ANGIOGRAPHY N/A 12/06/2017  Procedure: CORONARY ANGIOGRAPHY;  Surgeon: Belva Crome, MD;  Location: [facility];  Service: Cardiovascular;  Laterality: N/A;  . CORONARY ANGIOGRAPHY N/A 09/26/2018   Procedure: CORONARY ANGIOGRAPHY;  Surgeon: Nelva Bush, MD;  Location: [facility];  Service: Cardiovascular;  Laterality: N/A;  . CORONARY STENT INTERVENTION N/A 12/10/2017   Procedure: CORONARY STENT INTERVENTION;  Surgeon: [name] Sine, MD;  Location: [facility];  Service: Cardiovascular;  Laterality: N/A;  . FOOT FRACTURE SURGERY Bilateral 2004-2010   "got pins in both of them"  . HEMORRHOID  SURGERY N/A 06/15/2015   Procedure: HEMORRHOIDECTOMY;  Surgeon: Stark Klein, MD;  Location: [facility];  Service: General;  Laterality: N/A;  . INGUINAL HERNIA REPAIR Right ~ 1996  . LEFT HEART CATHETERIZATION WITH CORONARY ANGIOGRAM N/A 06/21/2014   Procedure: LEFT HEART CATHETERIZATION WITH CORONARY ANGIOGRAM;  Surgeon: Larey Dresser, MD;  Location: [facility] CATH LAB;  Service: Cardiovascular;  Laterality: N/A;  . LOOP RECORDER INSERTION N/A 06/19/2016   Procedure: Loop Recorder Insertion;  Surgeon: Deboraha Sprang, MD;  Location: [facility];  Service: Cardiovascular;  Laterality: N/A;  . TEE WITHOUT CARDIOVERSION N/A 03/12/2016   Procedure: TRANSESOPHAGEAL ECHOCARDIOGRAM (TEE);  Surgeon: Sanda Klein, MD;  Location: [facility];  Service: Cardiovascular;  Laterality: N/A;    Vitals:   11/28/18 0855 11/28/18 0904  BP: (!) 202/114 (!) 202/111  Pulse: 67     Subjective Assessment - 11/28/18 0855    Subjective  Pt reports he has been having increase pain in left side/abdomen at site of previous surgery.  Has been gradually increasing over the past several weeks.  Has left messages with MD at [facility] but has not heard back.  Has appt tomorrow with Family Practice MD but has not called them.  States it feels like he bumped into something but he hasnt bumped anything.    Currently in Pain?  Yes    Pain Score  5     Pain Location  Abdomen    Pain Orientation  Left    Pain Descriptors / Indicators  Pressure    Pain Type  Chronic pain    Pain Onset  More than a month ago    Pain Frequency  Constant    Aggravating Factors   unsure    Pain Relieving Factors  unsure       Pt arrived for treatment and having c/o increased L side abdominal pain.  Points to previous incision site.  States pain has increased over past several weeks.  Checked BP.  See vitals above. Immediately contacted Juluis Mire, NP, at [facility].  Spoke to [place] via phone who recommended for pt to go to ED  immediately or call 911.   Relayed information to pt to states he will go immediately to the ED and declined calling 911.  Pt denies any other symptoms (no HA, no weakness, no SOB, no chest pain, no visual changes).  Pt was going to call his daughter to meet him at the ED.   PT Education - 11/28/18 0940    Education Details  Need to go to ED immediately or option to call 911 now    Person(s) Educated  Patient    Methods  Explanation    Comprehension  Verbalized understanding       Visit Diagnosis: Other symptoms and signs involving the nervous system     Problem List Patient Active Problem List   Diagnosis Date Noted  .  Chest pain, rule out acute myocardial infarction 09/24/2018  . Chronic systolic (congestive) heart failure ([place]) 04/25/2018  . Acute on chronic combined systolic and diastolic CHF (congestive heart failure) ([place]) 12/08/2017  . Coronary artery disease involving native coronary artery of native heart with angina pectoris ([place]) 12/06/2017  . NSTEMI (non-ST elevated myocardial infarction) ([place]) 12/02/2017  . Hypertensive emergency - Accelerated Hypertension 12/02/2017  . History of stroke 10/05/2017  . Somnolence, daytime 04/07/2017  . Snoring 04/07/2017  . Cerebral infarction due to embolism of left middle cerebral artery ([place]) 06/03/2016  . History of aortic dissection 06/03/2016  . Palpitation   . Cerebrovascular accident (CVA) due to embolism of left middle cerebral artery ([place])   . Seizures ([place]) 02/28/2016  . HLD (hyperlipidemia) 08/28/2015  . 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[place]) 06/07/2015  . Cardiomyopathy ([place]) 03/01/2015  . Acid reflux 03/01/2015  . Essential hypertension 03/01/2015  . S/P aortic dissection repair 10/03/2014  . H/O aortic valve replacement 10/03/2014  . CKD (chronic kidney disease) stage 3, GFR 30-59 ml/min (HCC) 05/15/2014  . AKI (acute kidney injury) ([place])   . Dissecting aneurysm of thoracic aorta, Stanford type B ([place])  04/24/2014  . Aortic dissection ([place]) 04/23/2014  . Aneurysm of ascending aorta (HCC) 03/28/2014  . Dyspnea 03/27/2014  . ETOH abuse 09/20/2013  . Malignant hypertension 02/20/2013  . Hypertensive urgency 01/27/2012  . Cardiomyopathy, hypertensive ([place]) 01/26/2012  . AI (aortic insufficiency) 01/26/2012  . MR (mitral regurgitation) 01/26/2012  . Acute renal failure superimposed on stage 3 chronic kidney disease ([place]) 01/26/2012  . Hypertensive cardiomyopathy, without heart failure ([place]): EF reduced to 35-40% from 55%. 06/10/2011  . Chronic bronchitis 05/23/2011  . Cardiomegaly - hypertensive 05/22/2011  . Marijuana use 05/22/2011  . Abdominal pain 05/22/2011  . Hypertension, malignant 05/22/2011  . Anxiety and depression 05/22/2011  . Hyperlipidemia LDL goal <70 08/26/2009  . Tobacco use 08/26/2009  . Headache(784.0) 08/26/2009  . Aortic valve disorder 03/11/2009  . INGUINAL HERNIA 02/18/2009  . Uncontrolled hypertension 01/16/2009    Above also discussed with Janann August, PT, who agrees with above.  Narda Bonds, [place] 11/28/18 9:50 AM Phone: [phone] Fax: [facility] [address] [place], [place], [address] Phone: [phone]   Fax:  [phone]  Name: KYZER BLOWE MRN: [id number] Date of Birth: February 25, 1971

## 2018-11-29 ENCOUNTER — Ambulatory Visit (INDEPENDENT_AMBULATORY_CARE_PROVIDER_SITE_OTHER): Payer: Medicare HMO | Admitting: Primary Care

## 2018-11-29 ENCOUNTER — Telehealth (INDEPENDENT_AMBULATORY_CARE_PROVIDER_SITE_OTHER): Payer: Self-pay | Admitting: Primary Care

## 2018-11-29 ENCOUNTER — Other Ambulatory Visit: Payer: Self-pay

## 2018-11-29 ENCOUNTER — Encounter (INDEPENDENT_AMBULATORY_CARE_PROVIDER_SITE_OTHER): Payer: Self-pay | Admitting: Primary Care

## 2018-11-29 DIAGNOSIS — Z720 Tobacco use: Secondary | ICD-10-CM | POA: Diagnosis not present

## 2018-11-29 DIAGNOSIS — F5101 Primary insomnia: Secondary | ICD-10-CM

## 2018-11-29 DIAGNOSIS — I10 Essential (primary) hypertension: Secondary | ICD-10-CM | POA: Diagnosis not present

## 2018-11-29 MED ORDER — BLOOD PRESSURE MONITOR KIT: 1.0000 | PACK | Freq: Two times a day (BID) | 0 refills | Status: DC

## 2018-11-29 NOTE — Progress Notes (Signed)
Virtual Visit via Telephone Note  I connected with [name] on 11/29/18 at  8:50 AM EDT by telephone and verified that I am speaking with the correct person using two identifiers.   I discussed the limitations, risks, security and privacy concerns of performing an evaluation and management service by telephone and the availability of in person appointments. I also discussed with the patient that there may be a patient responsible charge related to this service. The patient expressed understanding and agreed to proceed.   History of Present Illness: [name] presents for follow up on medication for anxiety and insomnia he states the medicine for sleep trazodone he is unable to tell a difference the Wellbutrin has decrease his desire to smoke .   Past Medical History:  Diagnosis Date  . Adenomatous colon polyp 08/2015  . Anxiety   . Aortic disease ([place])   . Aortic dissection (HCC)    a. admx 04/2014 >> L renal infarct; a/c renal failure >> b.  s/p Bioprosthetic Bentall and total arch replacement and staged endovascular repair of descending aortic aneurysm ([facility] - Dr. [name])  . CAD (coronary artery disease)    a. LHC 4/16:  oD1 60%  . Cardiomyopathy ([place])    a. non-ischemic - probably related to untreated HTN and ETOH abuse - Echo 3/13 with EF 35-40% >> b. Echo 4/16: Severe LVH, EF 55-60%, moderate AI, moderate MR, mild LAE, trivial effusion, known type B dissection with communication between true and false lumens with suprasternal images suggesting dissection plane may propagate to at least left subclavian takeoff, root above aortic valve okay    . Chronic abdominal pain   . Chronic combined systolic and diastolic congestive heart failure ([place])    a. 05/2011: Adm with pulm edema/HTN urgency, EF 35-40% with diffuse hypokinesis and moderate to severe mitral regurgitation. Cardiomyopathy likely due to uncontrolled HTN and ETOH abuse - cath deferred due to renal insufficiency (felt due to  uncontrolled HTN). bJodie Echevaria MV 06/2011: EF 37% and no ischemia or infarction. c. EF 45-50% by echo 01/2012.  [name] Chronic sinusitis   . CKD (chronic kidney disease)    a. 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  peak creatinine 3.46 during admx for aortic dissection 2/16.  sees Dr [name]  . DDD (degenerative disc disease), lumbar   . Depression   . Descending thoracic aortic aneurysm ([place])   . Dissecting aneurysm of thoracic aorta ([place])   . ETOH abuse    a. Reported to have quit 05/2011.  . Frequent headaches   . GERD (gastroesophageal reflux disease)   . Headache(784.0)    "q other day" (08/08/2013)  . Heart murmur   . Hemorrhoid thrombosis   . History of echocardiogram    Echo 1/17:  Severe LVH, EF 55-60%, no RWMA, Gr 2 DD, AVR ok, mild to mod MR, mild LAE, mild reduced RVSF, mod RAE  . History of medication noncompliance   . HYPERLIPIDEMIA   . Hypertension    a. Hx of HTN urgency secondary to noncompliance. b. urinary metanephrine and catecholeamine levels normal 2013.  c. Renal art [name] 1/16:  No evidence of renal artery stenosis noted bilaterally.  . INGUINAL HERNIA   . Pneumonia ~ 2013  . Renal insufficiency   . Serrated adenoma of colon 08/2015  . Stroke ([place])   . Tobacco abuse   . Valvular heart disease    a. Echo 05/2011: moderate to severe eccentric MR and mild to moderate AI with  prolapsing left coronary cusp. b. Echo 01/2012: mild-mod AI, mild dilitation of aortic root, mild MR.;  c. Echo 1/16: Severe LVH consistent with hypertrophic cardio myopathy, EF 50%, no RWMA, mod AI, mild MR, mild RAE, dilated Ao root (40 mm);    Observations/Objective:[name]  Review of Systems  Psychiatric/Behavioral: The patient has insomnia.   All other systems reviewed and are negative.   Assessment and Plan: [name] was seen today for follow-up.  Diagnoses and all orders for this visit:  Primary insomnia Increased Trazodone from 25mg  to 50mg  to increase effectiveness . He will be re-evaluated in 3 weeks  on the new dose. Discussed    good sleep hygiene. Stress, anxiety, and depression.  Poor sleeping habits.  Distractions such as TV in the bedroom.  Naps close to bedtime.  Engaging in emotionally charged conversations before bed.  Technical reading before sleep. Alcohol and other sedatives. They may make the problem worst  Insomnia Insomnia is frequent trouble falling and/or staying asleep. Insomnia can be a long term problem or a short term problem. Both are common. Insomnia can be a short term problem when the wakefulness is related to a certain stress or worry. Long term insomnia is often related to ongoing stress during waking hours and/or poor sleeping habits. Overtime, sleep deprivation itself can make the problem worse. Every little thing feels more severe because you are overtired and your ability to cope is decreased.  Tobacco abuse Nicotine can decrease circulation in your body and affect every organ. Reseach shows increase in lung cancer and respiratory problems. Wellbutrin is decreasing his desire to smoke he admits to lighting a cigarette takes a few puff and stops. States he is really trying.  Essential hypertension Patient is unable to check blood pressure at home will order a BP monitor. Counseled onlow-sodium, DASH diet, medication compliance, 150 minutes of moderate intensity exercise per week. Discussed medication compliance, adverse effects.    Follow Up Instructions:    I discussed the assessment and treatment plan with the patient. The patient was provided an opportunity to ask questions and all were answered. The patient agreed with the plan and demonstrated an understanding of the instructions.   The patient was advised to call back or seek an in-person evaluation if the symptoms worsen or if the condition fails to improve as anticipated.  I provided 18 minutes of non-face-to-face time during this encounter.   Kerin Perna, NP

## 2018-11-29 NOTE — Progress Notes (Signed)
Pt states the trazadone is not really helping  Pt states his sleep pattern is the same as it was at last visit He is still down to 3-4 cigs a day

## 2018-12-05 ENCOUNTER — Ambulatory Visit: Payer: Medicare HMO | Attending: Adult Health | Admitting: Physical Therapy

## 2018-12-05 ENCOUNTER — Other Ambulatory Visit: Payer: Self-pay

## 2018-12-05 ENCOUNTER — Encounter: Payer: Self-pay | Admitting: Physical Therapy

## 2018-12-05 VITALS — BP 156/89 | HR 80

## 2018-12-05 DIAGNOSIS — R208 Other disturbances of skin sensation: Secondary | ICD-10-CM | POA: Diagnosis present

## 2018-12-05 DIAGNOSIS — R29818 Other symptoms and signs involving the nervous system: Secondary | ICD-10-CM | POA: Diagnosis not present

## 2018-12-05 DIAGNOSIS — R2681 Unsteadiness on feet: Secondary | ICD-10-CM | POA: Diagnosis present

## 2018-12-05 NOTE — Therapy (Signed)
[facility] [address] [place], [place], [address] Phone: [phone]   Fax:  [phone]  Physical Therapy Treatment  Patient Details  Name: [name] MRN: [id number] Date of Birth: December 23, 1970 Referring Provider (PT): Claris Gower, NP   Encounter Date: 12/05/2018  PT End of Session - 12/05/18 0852    Visit Number  3    Number of Visits  6    Date for PT Re-Evaluation  12/27/18    Authorization Type  [employer] and Medicaid    PT Start Time  0804    PT Stop Time  0846    PT Time Calculation (min)  42 min    Equipment Utilized During Treatment  Gait belt    Activity Tolerance  Patient tolerated treatment well   eval limited secondary to high BP   Behavior During Therapy  [facility] for tasks assessed/performed       Past Medical History:  Diagnosis Date  . Adenomatous colon polyp 08/2015  . Anxiety   . Aortic disease ([place])   . Aortic dissection (HCC)    a. admx 04/2014 >> L renal infarct; a/c renal failure >> b.  s/p Bioprosthetic Bentall and total arch replacement and staged endovascular repair of descending aortic aneurysm ([facility] - Dr. [name])  . CAD (coronary artery disease)    a. LHC 4/16:  oD1 60%  . Cardiomyopathy ([place])    a. non-ischemic - probably related to untreated HTN and ETOH abuse - Echo 3/13 with EF 35-40% >> b. Echo 4/16: Severe LVH, EF 55-60%, moderate AI, moderate MR, mild LAE, trivial effusion, known type B dissection with communication between true and false lumens with suprasternal images suggesting dissection plane may propagate to at least left subclavian takeoff, root above aortic valve okay    . Chronic abdominal pain   . Chronic combined systolic and diastolic congestive heart failure ([place])    a. 05/2011: Adm with pulm edema/HTN urgency, EF 35-40% with diffuse hypokinesis and moderate to severe mitral regurgitation. Cardiomyopathy likely due to uncontrolled HTN and ETOH abuse - cath deferred due  to renal insufficiency (felt due to uncontrolled HTN). bJodie Echevaria MV 06/2011: EF 37% and no ischemia or infarction. c. EF 45-50% by echo 01/2012.  [name] Chronic sinusitis   . CKD (chronic kidney disease)    a. 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  peak creatinine 3.46 during admx for aortic dissection 2/16.  sees Dr [name]  . DDD (degenerative disc disease), lumbar   . Depression   . Descending thoracic aortic aneurysm ([place])   . Dissecting aneurysm of thoracic aorta ([place])   . ETOH abuse    a. Reported to have quit 05/2011.  . Frequent headaches   . GERD (gastroesophageal reflux disease)   . Headache(784.0)    "q other day" (08/08/2013)  . Heart murmur   . Hemorrhoid thrombosis   . History of echocardiogram    Echo 1/17:  Severe LVH, EF 55-60%, no RWMA, Gr 2 DD, AVR ok, mild to mod MR, mild LAE, mild reduced RVSF, mod RAE  . History of medication noncompliance   . HYPERLIPIDEMIA   . Hypertension    a. Hx of HTN urgency secondary to noncompliance. b. urinary metanephrine and catecholeamine levels normal 2013.  c. Renal art [name] 1/16:  No evidence of renal artery stenosis noted bilaterally.  . INGUINAL HERNIA   . Pneumonia ~ 2013  . Renal insufficiency   . Serrated adenoma of colon 08/2015  .  Stroke ([place])   . Tobacco abuse   . Valvular heart disease    a. Echo 05/2011: moderate to severe eccentric MR and mild to moderate AI with prolapsing left coronary cusp. b. Echo 01/2012: mild-mod AI, mild dilitation of aortic root, mild MR.;  c. Echo 1/16: Severe LVH consistent with hypertrophic cardio myopathy, EF 50%, no RWMA, mod AI, mild MR, mild RAE, dilated Ao root (40 mm);     Past Surgical History:  Procedure Laterality Date  . ANKLE SURGERY Bilateral    Fractures bilaterally  . AORTIC VALVE SURGERY  09/2014  . CORONARY ANGIOGRAPHY N/A 12/06/2017   Procedure: CORONARY ANGIOGRAPHY;  Surgeon: Belva Crome, MD;  Location: [facility];  Service: Cardiovascular;  Laterality: N/A;  . CORONARY  ANGIOGRAPHY N/A 09/26/2018   Procedure: CORONARY ANGIOGRAPHY;  Surgeon: Nelva Bush, MD;  Location: [facility];  Service: Cardiovascular;  Laterality: N/A;  . CORONARY STENT INTERVENTION N/A 12/10/2017   Procedure: CORONARY STENT INTERVENTION;  Surgeon: [name] Sine, MD;  Location: [facility];  Service: Cardiovascular;  Laterality: N/A;  . FOOT FRACTURE SURGERY Bilateral 2004-2010   "got pins in both of them"  . HEMORRHOID SURGERY N/A 06/15/2015   Procedure: HEMORRHOIDECTOMY;  Surgeon: Stark Klein, MD;  Location: [facility];  Service: General;  Laterality: N/A;  . INGUINAL HERNIA REPAIR Right ~ 1996  . LEFT HEART CATHETERIZATION WITH CORONARY ANGIOGRAM N/A 06/21/2014   Procedure: LEFT HEART CATHETERIZATION WITH CORONARY ANGIOGRAM;  Surgeon: Larey Dresser, MD;  Location[facility] C[facility] LAB;  Service: Cardiovascular;  Laterality: N/A;  . LOOP RECORDER INSERTION N/A 06/19/2016   Procedure: Loop Recorder Insertion;  Surgeon: Deboraha Sprang, MD;  Location: [facility] CV LAB;  Service: Cardiovascular;  Laterality: N/A;  . TEE WITHOUT CARDIOVERSION N/A 03/12/2016   Procedure: TRANSESOPHAGEAL ECHOCARDIOGRAM (TEE);  Surgeon: Sanda Klein, MD;  Location[facility] ENDOSCOPY;  Service: Cardiovascular;  Laterality: N/A;    Vitals:   12/05/18 0814  BP: (!) 156/89  Pulse: 80    Subjective Assessment - 12/05/18 0806    Subjective  Did not go to the ER after pt was advised to when he was here last week. Did not take his hydralazine. Instead pt went home and took his medication and he reports his BP dropped to 976 systolic 73-41 minutes after taking it. Pt took his medication this morning. Has been doing his exercises every other day.    Pain Onset  More than a month ago                       [facility] Adult PT Treatment/Exercise - 12/05/18 0856      High Level Balance   High Level Balance Comments  Standing in // bars: tandem walking on foam balance beam forwards down and back 4 reps,  backwards tandem walking x2 reps - with intermittent UE support. Followed by tandem stance static holds on foam 2 x 30 second reps B. Standing on foam massed practice SLS on L (and reps on R) touching cones and colorful bubbles placed in front (4 in total) progressing from single taps to double taps. Standing on foam with feet hip width apart: eyes closed 2 x 10 reps vertical head nods, 2 x 10 reps horizontal head turns. With feet together and eyes closed 4 x 30 second reps.       Self-Care   Self-Care  Other Self-Care Comments    Other Self-Care Comments  Further discussed with pt the warning signs and symptoms of a stroke - pt able to verbalize understanding using teach back method. Discussed with pt keeping a BP log and monitoring it at least 3x daily.              PT Education - 12/05/18 0851    Education Details  BP log for monitoring BP readings at home    Person(s) Educated  Patient    Methods  Explanation;Demonstration;Handout    Comprehension  Verbalized understanding;Returned demonstration       PT Short Term Goals - 11/08/18 1250      PT SHORT TERM GOAL #1   Title  Patient will be independent with initial HEP in order to build upon functional gains in therapy.. ALL STGS DUE 12/06/18    Time  4    Period  Weeks    Status  New    Target Date  12/06/18      PT SHORT TERM GOAL #2   Title  Patient will verbalize understanding of risks factors and warning signs/symptoms of a stroke.    Status  New      PT SHORT TERM GOAL #3   Title  Patient will improve FGA score to at least a 24/30 in order to decrease risk of falls.    Baseline  21/30 on 11/07/18        PT Long Term Goals - 11/08/18 1252      PT LONG TERM GOAL #1   Title  Patient will be independent with final HEP in order to build upon functional gains made in therapy. ALL LTGS DUE 12/27/18.    Time  7    Period  Weeks    Status  New    Target Date  12/27/18      PT LONG TERM GOAL #2   Title  Patient will  improve FGA score to at least a 27/30 in order to decrease risk of falls.      PT LONG TERM GOAL #3   Title  Patient will perform 4 steps with no handrail with step through pattern and mod I in order to safely enter/exit his shed in his backyard.    Baseline  Not yet assessed      PT LONG TERM GOAL #4   Title  Make goal for 3 or 6MWT for endurance.    Baseline  not yet assesssed      PT LONG TERM GOAL #5   Title  Patient will ambulate at least 1000' outdoors over unlevel surfaces while scanning environment with mod I in order to improve community mobility.    Baseline  not yet assessed.            Plan - 12/05/18 0900    Clinical Impression Statement  Pt took all of his BP medication this morning - BP WNL to participate in PT today. Focus of today's session was ankle balance strategies on compliant surfaces - SLS, eyes open, and eyes closed. With increasing repetitions, pt able to hold feet together eyes closed for 30 seconds with no LOB. Brief standing rest breaks provided throughout session. Will continue to progress towards LTGs.    Personal Factors and Comorbidities  Past/Current Experience;Comorbidity 3+    Comorbidities  NSTEMI 09/2018, hx of CVA (2017), aortic dissection status post endoluminal stent grafting 2019, HTN, CAD, HLD, coronary artery disease, nonischemic cardiomyopathy, heart failure, chronic kidney disease and tobacco abuse    Examination-Activity Limitations  Locomotion  Level;Stairs    Stability/Clinical Decision Making  Evolving/Moderate complexity    Rehab Potential  Good    PT Frequency  1x / week    PT Duration  6 weeks    PT Treatment/Interventions  ADLs/Self Care Home Management;Gait training;Stair training;Functional mobility training;Therapeutic activities;Therapeutic exercise;Balance training;Patient/family education;Neuromuscular re-education    PT Next Visit Plan  check BP at beginning/during every session. - check STGs.  3 minute or 6 minute walk test  when pt has taken BP meds. Higher level balance on compliant surfaces with EC, SLS activities.    PT Home Exercise Plan  [id number]    Consulted and Agree with Plan of Care  Patient       Patient will benefit from skilled therapeutic intervention in order to improve the following deficits and impairments:  Decreased activity tolerance, Decreased balance, Difficulty walking, Decreased strength, Impaired sensation, Decreased coordination  Visit Diagnosis: Other symptoms and signs involving the nervous system  Unsteadiness on feet  Other disturbances of skin sensation     Problem List Patient Active Problem List   Diagnosis Date Noted  . Chest pain, rule out acute myocardial infarction 09/24/2018  . Chronic systolic (congestive) heart failure ([place]) 04/25/2018  . Acute on chronic combined systolic and diastolic CHF (congestive heart failure) ([place]) 12/08/2017  . Coronary artery disease involving native coronary artery of native heart with angina pectoris ([place]) 12/06/2017  . NSTEMI (non-ST elevated myocardial infarction) ([place]) 12/02/2017  . Hypertensive emergency - Accelerated Hypertension 12/02/2017  . History of stroke 10/05/2017  . Somnolence, daytime 04/07/2017  . Snoring 04/07/2017  . Cerebral infarction due to embolism of left middle cerebral artery ([place]) 06/03/2016  . History of aortic dissection 06/03/2016  . Palpitation   . Cerebrovascular accident (CVA) due to embolism of left middle cerebral artery ([place])   . Seizures ([place]) 02/28/2016  . HLD (hyperlipidemia) 08/28/2015  . 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[place]) 06/07/2015  . Cardiomyopathy ([place]) 03/01/2015  . Acid reflux 03/01/2015  . Essential hypertension 03/01/2015  . S/P aortic dissection repair 10/03/2014  . H/O aortic valve replacement 10/03/2014  . CKD (chronic kidney disease) stage 3, GFR 30-59 ml/min (HCC) 05/15/2014  . AKI (acute kidney injury) ([place])   . Dissecting aneurysm of thoracic aorta, [place] type B  ([place]) 04/24/2014  . Aortic dissection ([place]) 04/23/2014  . Aneurysm of ascending aorta (HCC) 03/28/2014  . Dyspnea 03/27/2014  . ETOH abuse 09/20/2013  . Malignant hypertension 02/20/2013  . Hypertensive urgency 01/27/2012  . Cardiomyopathy, hypertensive ([place]) 01/26/2012  . AI (aortic insufficiency) 01/26/2012  . MR (mitral regurgitation) 01/26/2012  . Acute renal failure superimposed on stage 3 chronic kidney disease ([place]) 01/26/2012  . Hypertensive cardiomyopathy, without heart failure ([place]): EF reduced to 35-40% from 55%. 06/10/2011  . Chronic bronchitis 05/23/2011  . Cardiomegaly - hypertensive 05/22/2011  . Marijuana use 05/22/2011  . Abdominal pain 05/22/2011  . Hypertension, malignant 05/22/2011  . Anxiety and depression 05/22/2011  . Hyperlipidemia LDL goal <70 08/26/2009  . Tobacco use 08/26/2009  . Headache(784.0) 08/26/2009  . Aortic valve disorder 03/11/2009  . INGUINAL HERNIA 02/18/2009  . Uncontrolled hypertension 01/16/2009    Arliss Journey, PT, DPT 12/05/2018, 9:03 AM  [facility] [address] [place], [place], [address] Phone: [phone]   Fax:  [phone]  Name: [name] MRN: [id number] Date of Birth: 1970-11-04

## 2018-12-05 NOTE — Patient Instructions (Signed)
Blood Pressure Record Sheet To take your blood pressure, you will need a blood pressure machine. You can buy a blood pressure machine (blood pressure monitor) at your clinic, drug store, or online. When choosing one, consider:  An automatic monitor that has an arm cuff.  A cuff that wraps snugly around your upper arm. You should be able to fit only one finger between your arm and the cuff.  A device that stores blood pressure reading results.  Do not choose a monitor that measures your blood pressure from your wrist or finger. Follow your health care provider's instructions for how to take your blood pressure. To use this form:  Get one reading in the morning (a.m.) before you take any medicines.  Get one reading in the evening (p.m.) before supper.  Take at least 2 readings with each blood pressure check. This makes sure the results are correct. Wait 1-2 minutes between measurements.  Write down the results in the spaces on this form.  Repeat this once a week, or as told by your health care provider.  Make a follow-up appointment with your health care provider to discuss the results. Blood pressure log Date: _______________________  a.m. _____________________(1st reading) _____________________(2nd reading)  p.m. _____________________(1st reading) _____________________(2nd reading) Date: _______________________  a.m. _____________________(1st reading) _____________________(2nd reading)  p.m. _____________________(1st reading) _____________________(2nd reading) Date: _______________________  a.m. _____________________(1st reading) _____________________(2nd reading)  p.m. _____________________(1st reading) _____________________(2nd reading) Date: _______________________  a.m. _____________________(1st reading) _____________________(2nd reading)  p.m. _____________________(1st reading) _____________________(2nd reading) Date: _______________________  a.m.  _____________________(1st reading) _____________________(2nd reading)  p.m. _____________________(1st reading) _____________________(2nd reading) This information is not intended to replace advice given to you by your health care provider. Make sure you discuss any questions you have with your health care provider. Document Released: 11/29/2002 Document Revised: 04/30/2017 Document Reviewed: 03/02/2017 Elsevier Patient Education  2020 [employer]  

## 2018-12-12 ENCOUNTER — Ambulatory Visit: Payer: Medicare HMO | Admitting: Physical Therapy

## 2018-12-12 ENCOUNTER — Encounter: Payer: Self-pay | Admitting: Physical Therapy

## 2018-12-12 ENCOUNTER — Other Ambulatory Visit: Payer: Self-pay

## 2018-12-12 VITALS — BP 156/94 | HR 84

## 2018-12-12 DIAGNOSIS — R208 Other disturbances of skin sensation: Secondary | ICD-10-CM

## 2018-12-12 DIAGNOSIS — R29818 Other symptoms and signs involving the nervous system: Secondary | ICD-10-CM

## 2018-12-12 DIAGNOSIS — R2681 Unsteadiness on feet: Secondary | ICD-10-CM

## 2018-12-12 NOTE — Therapy (Signed)
[address] [place], [place], [address] Phone: [phone]   Fax:  [phone]  Physical Therapy Treatment  Patient Details  Name: [name] MRN: [id number] Date of Birth: August 29, 1970 Referring Provider (PT): Claris Gower, NP   Encounter Date: 12/12/2018  PT End of Session - 12/12/18 0803    Visit Number  4    Number of Visits  6    Date for PT Re-Evaluation  12/27/18    Authorization Type  [employer] and Medicaid    PT Start Time  0717    PT Stop Time  0759    PT Time Calculation (min)  42 min    Equipment Utilized During Treatment  Gait belt    Activity Tolerance  Patient tolerated treatment well   eval limited secondary to high BP   Behavior During Therapy  [facility] for tasks assessed/performed       Past Medical History:  Diagnosis Date  . Adenomatous colon polyp 08/2015  . Anxiety   . Aortic disease ([place])   . Aortic dissection (HCC)    a. admx 04/2014 >> L renal infarct; a/c renal failure >> b.  s/p Bioprosthetic [place] and total arch replacement and staged endovascular repair of descending aortic aneurysm ([facility] - Dr. [name])  . CAD (coronary artery disease)    a. LHC 4/16:  oD1 60%  . Cardiomyopathy ([place])    a. non-ischemic - probably related to untreated HTN and ETOH abuse - Echo 3/13 with EF 35-40% >> b. Echo 4/16: Severe LVH, EF 55-60%, moderate AI, moderate MR, mild LAE, trivial effusion, known type B dissection with communication between true and false lumens with suprasternal images suggesting dissection plane may propagate to at least left subclavian takeoff, root above aortic valve okay    . Chronic abdominal pain   . Chronic combined systolic and diastolic congestive heart failure ([place])    a. 05/2011: Adm with pulm edema/HTN urgency, EF 35-40% with diffuse hypokinesis and moderate to severe mitral regurgitation. Cardiomyopathy likely due to uncontrolled HTN and ETOH abuse - cath deferred due  to renal insufficiency (felt due to uncontrolled HTN). bJodie Echevaria MV 06/2011: EF 37% and no ischemia or infarction. c. EF 45-50% by echo 01/2012.  [name] Chronic sinusitis   . CKD (chronic kidney disease)    a. 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  peak creatinine 3.46 during admx for aortic dissection 2/16.  sees Dr [name]  . DDD (degenerative disc disease), lumbar   . Depression   . Descending thoracic aortic aneurysm ([place])   . Dissecting aneurysm of thoracic aorta ([place])   . ETOH abuse    a. Reported to have quit 05/2011.  . Frequent headaches   . GERD (gastroesophageal reflux disease)   . Headache(784.0)    "q other day" (08/08/2013)  . Heart murmur   . Hemorrhoid thrombosis   . History of echocardiogram    Echo 1/17:  Severe LVH, EF 55-60%, no RWMA, Gr 2 DD, AVR ok, mild to mod MR, mild LAE, mild reduced RVSF, mod RAE  . History of medication noncompliance   . HYPERLIPIDEMIA   . Hypertension    a. Hx of HTN urgency secondary to noncompliance. b. urinary metanephrine and catecholeamine levels normal 2013.  c. Renal art [name] 1/16:  No evidence of renal artery stenosis noted bilaterally.  . INGUINAL HERNIA   . Pneumonia ~ 2013  . Renal insufficiency   . Serrated adenoma of colon 08/2015  .  Stroke ([place])   . Tobacco abuse   . Valvular heart disease    a. Echo 05/2011: moderate to severe eccentric MR and mild to moderate AI with prolapsing left coronary cusp. b. Echo 01/2012: mild-mod AI, mild dilitation of aortic root, mild MR.;  c. Echo 1/16: Severe LVH consistent with hypertrophic cardio myopathy, EF 50%, no RWMA, mod AI, mild MR, mild RAE, dilated Ao root (40 mm);     Past Surgical History:  Procedure Laterality Date  . ANKLE SURGERY Bilateral    Fractures bilaterally  . AORTIC VALVE SURGERY  09/2014  . CORONARY ANGIOGRAPHY N/A 12/06/2017   Procedure: CORONARY ANGIOGRAPHY;  Surgeon: Belva Crome, MD;  Location: [place] [facility];  Service: Cardiovascular;  Laterality: N/A;  . CORONARY  ANGIOGRAPHY N/A 09/26/2018   Procedure: CORONARY ANGIOGRAPHY;  Surgeon: Nelva Bush, MD;  Location: [place] [facility];  Service: Cardiovascular;  Laterality: N/A;  . CORONARY STENT INTERVENTION N/A 12/10/2017   Procedure: CORONARY STENT INTERVENTION;  Surgeon: [name] Sine, MD;  Location: [place] [facility];  Service: Cardiovascular;  Laterality: N/A;  . FOOT FRACTURE SURGERY Bilateral 2004-2010   "got pins in both of them"  . HEMORRHOID SURGERY N/A 06/15/2015   Procedure: HEMORRHOIDECTOMY;  Surgeon: Stark Klein, MD;  Location: [place];  Service: General;  Laterality: N/A;  . INGUINAL HERNIA REPAIR Right ~ 1996  . LEFT HEART CATHETERIZATION WITH CORONARY ANGIOGRAM N/A 06/21/2014   Procedure: LEFT HEART CATHETERIZATION WITH CORONARY ANGIOGRAM;  Surgeon: Larey Dresser, MD;  Location: [facility] CATH LAB;  Service: Cardiovascular;  Laterality: N/A;  . LOOP RECORDER INSERTION N/A 06/19/2016   Procedure: Loop Recorder Insertion;  Surgeon: Deboraha Sprang, MD;  Location: [place] [facility];  Service: Cardiovascular;  Laterality: N/A;  . TEE WITHOUT CARDIOVERSION N/A 03/12/2016   Procedure: TRANSESOPHAGEAL ECHOCARDIOGRAM (TEE);  Surgeon: Sanda Klein, MD;  Location: [facility] ENDOSCOPY;  Service: Cardiovascular;  Laterality: N/A;    Vitals:   12/12/18 0720  BP: (!) 156/94  Pulse: 84    Subjective Assessment - 12/12/18 0719    Subjective  Exercises at home are going well - some days they are harder than others. No new complaints and no falls.    Pain Onset  More than a month ago         [facility] PT Assessment - 12/12/18 0723      Functional Gait  Assessment   Gait assessed   Yes    Gait Level Surface  Walks 20 ft in less than 5.5 sec, no assistive devices, good speed, no evidence for imbalance, normal gait pattern, deviates no more than 6 in outside of the 12 in walkway width.    Change in Gait Speed  Able to smoothly change walking speed without loss of balance or gait deviation. Deviate no more than 6  in outside of the 12 in walkway width.    Gait with Horizontal Head Turns  Performs head turns smoothly with no change in gait. Deviates no more than 6 in outside 12 in walkway width    Gait with Vertical Head Turns  Performs head turns with no change in gait. Deviates no more than 6 in outside 12 in walkway width.    Gait and Pivot Turn  Pivot turns safely within 3 sec and stops quickly with no loss of balance.    Step Over Obstacle  Is able to step over 2 stacked shoe boxes taped together (9 in total height) without changing gait  speed. No evidence of imbalance.    Gait with Narrow Base of Support  Ambulates 7-9 steps.    Gait with Eyes Closed  Walks 20 ft, uses assistive device, slower speed, mild gait deviations, deviates 6-10 in outside 12 in walkway width. Ambulates 20 ft in less than 9 sec but greater than 7 sec.   7 seconds, veering to L    Ambulating Backwards  Walks 20 ft, no assistive devices, good speed, no evidence for imbalance, normal gait    Steps  Alternating feet, no rail.    Total Score  28    FGA comment:  28/30                 [facility] Adult PT Treatment/Exercise - 12/12/18 0810      High Level Balance   High Level Balance Comments  Standing in // bars on rocker board: 2 x 30 seconds static eyes closed holds, with eyes closed and wide BOS 2 x 5 reps horizontal head turns and vertical head nods - increased difficulty and LOB with vertical head nods. While standing on rocker board multiple reps SLS 16" step taps, progressing to stepping onto rocker board with SLS and double tapping 16" step, pt with decreased coordination and increased difficulty with SLS on L.          Access Code: [email or web address]  URL: [email or web address]  Date: 12/12/2018  Prepared by: Janann August   Exercises Tandem Stance on Foam Pad with Eyes Open - 3 sets - 30 hold - 2x daily - 7x weekly - new addition  Single Leg Stance on Foam Pad - 3 sets - 30 hold - 2x daily - 7x weekly -  upgraded to hold for 30 seconds  Romberg Stance Eyes Closed on Foam Pad - 10 reps - 3 sets - 2x daily - 7x weekly Tandem Stance on Foam Pad with Eyes Open - 10 reps - 3 sets - 2x daily - 7x weekly - with eyes closed, staggered stance, 10 head nods up and down and 10 head turns left and right.  Toe Walking - 3 sets - 2x daily - 7x weekly Heel Walking - 3 sets - 2x daily - 7x weekly Braided Sidestepping - 3 sets - 2x daily - 7x weekly Walking Tandem Stance - 3 sets - 2x daily - 7x weekly      PT Education - 12/12/18 0803    Education Details  additions to HEP - corner balance    Person(s) Educated  Patient    Methods  Explanation;Demonstration;Handout    Comprehension  Verbalized understanding;Returned demonstration       PT Short Term Goals - 12/12/18 0722      PT SHORT TERM GOAL #1   Title  Patient will be independent with initial HEP in order to build upon functional gains in therapy.. ALL STGS DUE 12/06/18    Baseline  pt reports he is independent with initial HEP    Time  4    Period  Weeks    Status  Achieved    Target Date  12/06/18      PT SHORT TERM GOAL #2   Title  Patient will verbalize understanding of risks factors and warning signs/symptoms of a stroke.    Baseline  pt able to verbalize.    Status  Achieved      PT SHORT TERM GOAL #3   Title  Patient will improve FGA score to at least a 24/30 in order to  decrease risk of falls.    Baseline  21/30 on 11/07/18, today 28/30 on 12/12/18    Status  Achieved        PT Long Term Goals - 12/12/18 0731      PT LONG TERM GOAL #1   Title  Patient will be independent with final HEP in order to build upon functional gains made in therapy. ALL LTGS DUE 12/27/18.    Time  7    Period  Weeks    Status  New      PT LONG TERM GOAL #2   Title  Patient will improve FGA score to at least a 27/30 in order to decrease risk of falls.    Baseline  28/30 on 12/12/18    Status  Achieved      PT LONG TERM GOAL #3   Title  Patient  will perform 4 steps with no handrail with step through pattern and mod I in order to safely enter/exit his shed in his backyard.    Baseline  Not yet assessed      PT LONG TERM GOAL #4   Title  Make goal for 3 or 6MWT for endurance.    Baseline  not yet assesssed      PT LONG TERM GOAL #5   Title  Patient will ambulate at least 1000' outdoors over unlevel surfaces while scanning environment with mod I in order to improve community mobility.    Baseline  not yet assessed.            Plan - 12/12/18 0805    Clinical Impression Statement  [name] BP WNL to participate in PT today. Focus of today's session was assessing STGs- pt as met all STGs and has met  LTG #2. Pt has improved his FGA scored by 7 points and scored a 28/30. Pt still having difficulty with tandem walking and gait with eyes closed (veered to the left). Upgraded pt's corner balance HEP today, with pt able to demonstrate correctly. Anticipate D/C at next visit and will check remaining LTGs.    Personal Factors and Comorbidities  Past/Current Experience;Comorbidity 3+    Comorbidities  NSTEMI 09/2018, hx of CVA (2017), aortic dissection status post endoluminal stent grafting 2019, HTN, CAD, HLD, coronary artery disease, nonischemic cardiomyopathy, heart failure, chronic kidney disease and tobacco abuse    Examination-Activity Limitations  Locomotion Level;Stairs    Stability/Clinical Decision Making  Evolving/Moderate complexity    Rehab Potential  Good    PT Frequency  1x / week    PT Duration  6 weeks    PT Treatment/Interventions  ADLs/Self Care Home Management;Gait training;Stair training;Functional mobility training;Therapeutic activities;Therapeutic exercise;Balance training;Patient/family education;Neuromuscular re-education    PT Next Visit Plan  check BP at beginning/during every session. finalize HEP. check remaining LTGs. prepare for D/C.    PT Home Exercise Plan  [email or web address]    Consulted and Agree with Plan of Care   Patient       Patient will benefit from skilled therapeutic intervention in order to improve the following deficits and impairments:  Decreased activity tolerance, Decreased balance, Difficulty walking, Decreased strength, Impaired sensation, Decreased coordination  Visit Diagnosis: Other symptoms and signs involving the nervous system  Unsteadiness on feet  Other disturbances of skin sensation     Problem List Patient Active Problem List   Diagnosis Date Noted  . Chest pain, rule out acute myocardial infarction 09/24/2018  . Chronic systolic (congestive) heart failure ([place]) 04/25/2018  . Acute on  chronic combined systolic and diastolic CHF (congestive heart failure) ([place]) 12/08/2017  . Coronary artery disease involving native coronary artery of native heart with angina pectoris ([place]) 12/06/2017  . NSTEMI (non-ST elevated myocardial infarction) ([place]) 12/02/2017  . Hypertensive emergency - Accelerated Hypertension 12/02/2017  . History of stroke 10/05/2017  . Somnolence, daytime 04/07/2017  . Snoring 04/07/2017  . Cerebral infarction due to embolism of left middle cerebral artery ([place]) 06/03/2016  . History of aortic dissection 06/03/2016  . Palpitation   . Cerebrovascular accident (CVA) due to embolism of left middle cerebral artery ([place])   . Seizures ([place]) 02/28/2016  . HLD (hyperlipidemia) 08/28/2015  . 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[place]) 06/07/2015  . Cardiomyopathy ([place]) 03/01/2015  . Acid reflux 03/01/2015  . Essential hypertension 03/01/2015  . S/P aortic dissection repair 10/03/2014  . H/O aortic valve replacement 10/03/2014  . CKD (chronic kidney disease) stage 3, GFR 30-59 ml/min (HCC) 05/15/2014  . AKI (acute kidney injury) ([place])   . Dissecting aneurysm of thoracic aorta, [place] type B ([place]) 04/24/2014  . Aortic dissection ([place]) 04/23/2014  . Aneurysm of ascending aorta (HCC) 03/28/2014  . Dyspnea 03/27/2014  . ETOH abuse 09/20/2013  . Malignant  hypertension 02/20/2013  . Hypertensive urgency 01/27/2012  . Cardiomyopathy, hypertensive ([place]) 01/26/2012  . AI (aortic insufficiency) 01/26/2012  . MR (mitral regurgitation) 01/26/2012  . Acute renal failure superimposed on stage 3 chronic kidney disease ([place]) 01/26/2012  . Hypertensive cardiomyopathy, without heart failure ([place]): EF reduced to 35-40% from 55%. 06/10/2011  . Chronic bronchitis 05/23/2011  . Cardiomegaly - hypertensive 05/22/2011  . Marijuana use 05/22/2011  . Abdominal pain 05/22/2011  . Hypertension, malignant 05/22/2011  . Anxiety and depression 05/22/2011  . Hyperlipidemia LDL goal <70 08/26/2009  . Tobacco use 08/26/2009  . Headache(784.0) 08/26/2009  . Aortic valve disorder 03/11/2009  . INGUINAL HERNIA 02/18/2009  . Uncontrolled hypertension 01/16/2009    Arliss Journey, PT, DPT 12/12/2018, 8:10 AM  [facility] [address] [place], [place], [address] Phone: [phone]   Fax:  [phone]  Name: [name] MRN: [id number] Date of Birth: [birth year]

## 2018-12-12 NOTE — Patient Instructions (Signed)
Access Code: [email or web address]  URL: [email or web address]  Date: 12/12/2018  Prepared by: Janann August   Exercises Tandem Stance on Foam Pad with Eyes Open - 3 sets - 30 hold - 2x daily - 7x weekly Single Leg Stance on Foam Pad - 3 sets - 30 hold - 2x daily - 7x weekly Romberg Stance Eyes Closed on Foam Pad - 10 reps - 3 sets - 2x daily - 7x weekly Tandem Stance on Foam Pad with Eyes Open - 10 reps - 3 sets - 2x daily - 7x weekly Toe Walking - 3 sets - 2x daily - 7x weekly Heel Walking - 3 sets - 2x daily - 7x weekly Braided Sidestepping - 3 sets - 2x daily - 7x weekly Walking Tandem Stance - 3 sets - 2x daily - 7x weekly

## 2018-12-15 ENCOUNTER — Other Ambulatory Visit (INDEPENDENT_AMBULATORY_CARE_PROVIDER_SITE_OTHER): Payer: Self-pay | Admitting: Primary Care

## 2018-12-19 ENCOUNTER — Ambulatory Visit: Payer: Medicare HMO | Attending: Adult Health | Admitting: Physical Therapy

## 2018-12-29 ENCOUNTER — Ambulatory Visit: Payer: Medicare HMO | Admitting: Physical Therapy

## 2019-01-03 ENCOUNTER — Encounter (HOSPITAL_COMMUNITY): Payer: Medicare HMO | Admitting: Cardiology

## 2019-01-25 ENCOUNTER — Encounter: Payer: Self-pay | Admitting: Physical Therapy

## 2019-01-25 NOTE — Therapy (Signed)
[facility] [address] [place], [place], [address] Phone: [phone]   Fax:  [phone]  Patient Details  Name: [name] MRN: [id number] Date of Birth: 03-Jun-1970 Referring Provider:  No ref. provider found  Encounter Date: 01/25/2019   Patient did not show for last 2 scheduled appointments to check remaining LTGs/finalize HEP for D/C.   PHYSICAL THERAPY DISCHARGE SUMMARY  Visits from Start of Care: 4  Current functional level related to goals / functional outcomes: See goals from note from 12/12/18   Remaining deficits: Impaired SLS, balance/gait with eyes closed, narrow BOS walking   Education / Equipment: HEP   Plan: Patient agrees to discharge.  Patient goals were partially met. Patient is being discharged due to not returning since the last visit.  ?????        Arliss Journey, PT, DPT  01/25/2019, 8:34 AM  [facility] [address] [place], [place], [address] Phone: [phone]   Fax:  [phone]

## 2019-02-01 ENCOUNTER — Other Ambulatory Visit: Payer: Self-pay

## 2019-02-01 ENCOUNTER — Ambulatory Visit (INDEPENDENT_AMBULATORY_CARE_PROVIDER_SITE_OTHER): Payer: Medicare HMO | Admitting: Adult Health

## 2019-02-01 ENCOUNTER — Encounter: Payer: Self-pay | Admitting: Adult Health

## 2019-02-01 VITALS — BP 136/82 | HR 64 | Temp 97.4°F | Ht 71.0 in | Wt 163.8 lb

## 2019-02-01 DIAGNOSIS — E785 Hyperlipidemia, unspecified: Secondary | ICD-10-CM

## 2019-02-01 DIAGNOSIS — R202 Paresthesia of skin: Secondary | ICD-10-CM | POA: Diagnosis not present

## 2019-02-01 DIAGNOSIS — M5481 Occipital neuralgia: Secondary | ICD-10-CM | POA: Diagnosis not present

## 2019-02-01 DIAGNOSIS — Z8673 Personal history of transient ischemic attack (TIA), and cerebral infarction without residual deficits: Secondary | ICD-10-CM

## 2019-02-01 DIAGNOSIS — I10 Essential (primary) hypertension: Secondary | ICD-10-CM

## 2019-02-01 NOTE — Progress Notes (Signed)
[facility]  PATIENT: [name] DOB: Aug 15, 1970   REASON FOR VISIT: Follow-up for stroke 02/2016 HISTORY FROM: Patient  Chief Complaint  Patient presents with  . Follow-up    3 mon f/u. Alone. Rm 9. No new concerns at this time.      HISTORY OF PRESENT ILLNESS  Andre J [name] a 48 y.o.malewith history of aortic dissection s/p repair and reconstruction, systolic and diastolic heart failure, and HTN admitted on 02/28/16 for confusion and right-sided weakness. In ED, had seizure and subsequent cardiac arrest. CT negative for bleeding. CTA without clear LVO, but moderate narrowing of left M2 and right P2. Hewas not considered for IV TPA due to seizure at onset with non-stroke presentation. Intubated and started on Keppra. Post extubation had mild right hemiparesis and word finding difficulty. MRI showed left MCA embolic stroke. EF 55-60%. Negative DVT. LDL 124 and A1C 5.3. Cardiology recommended outpt TEE and 30 day cardiac event monitoring. If monitoring negative, will consider loop recorder. He was discharged with ASA and lipitor without neuro deficit. Underwent TEE on 03/12/2016 which was unremarkable therefore loop recorder placed to assess for atrial fibrillation. Underwent aortic aneurysm repair in [id number]/[id number] without complications Underwent sleep study in 05/2017 which did not show evidence of sleep apnea Discontinue Keppra mid 2019 due to absence of seizures   10/25/2018 update: [name] is a 48 year old male who is being seen today for follow-up regarding stroke in 02/2016.  Residual stroke deficits of right-sided paresthesias with decreased sensory and subjective weakness.  The symptoms have been stable without any worsening but does feel as though memory/cognition has been worsening since his stroke.  He has difficulty remembering specific dates, conversations that recently took place and will have difficulty at times remembering where he is out while  driving despite being in no locations.  He also endorses delayed recall.  Paresthesias are worse right lower extremity distally and are typically worse at night which has been interfering with his sleep.  He has trialed gabapentin in the past without benefit.  He also endorses headaches located left-sided head along occipital nerve branch with an electrical shock type of sensation.  He has trialed Tylenol in the past with mild benefit.  Since prior visit,he has been hospitalized x2 with most recent 09/2018 with NSTEMI and continues to follow with cardiology regularly.  He continues on aspirin and clopidogrel 75 mg for secondary stroke prevention and underlying cardiac history without bleeding or bruising.  He was referred to lipid clinic for ongoing difficulty treating LDL despite max tolerated medical therapy.  Recommend initiating [place] and repeat labs in 3 to 4 months.  Blood pressure elevated at 180/98 which typically is elevated at visit but also endorses ongoing difficulty with management.  He denies any recurrent seizure activity and remains off Keppra.  Loop recorder is not shown atrial fibrillation thus far.  No further concerns at this time.  Update 02/01/2019: [name] is a 48 year old male who is being seen today for stroke follow-up.  Residual deficits right hemisensory deficits with paresthesias, subjective memory complaints and imbalance.  He was referred to neuropsychology at prior visit and does have initial evaluation on 02/23/2019.  Previously working with therapy with ongoing improvement.  Initiated amitriptyline at prior visit due to possible occipital headaches.  Possibly related to occipital neuralgia with evidence of cyst potentially pressing on occipital nerve.  He was referred to dermatology for further evaluation and potential removal of cyst but has not been evaluated  at this time.  Headaches have greatly improved with use of amitriptyline 25 mg nightly.  He does have ongoing  complaints of left side pain which has been ongoing since surgical procedure for renal aneurysm.  He states this greatly limits daily activity due to severe pain with certain positions consisting of sharp stabbing pain as well as burning type sensation.  Previously on amitriptyline and gabapentin without benefit.  He also endorses sensation of rib movement or catching sensation with specific movements.  He was advised by his cardiothoracic surgeon to obtain x-rays but has had difficulty due to COVID-19 restrictions.  He plans on following up with him soon regarding obtaining imaging.  He continues to be treated actively for ongoing depression by behavioral health.  Continues on aspirin and Plavix without bleeding or bruising.  Continues on Crestor and Zetia for HLD management. He is also prescribed Repatha but due to prescription issues, he has not had injections in the past 2 months.  He did not have follow-up with lipid clinic as recommended. Blood pressure today 136/82. Loop recorder has not shown atrial fibrillation thus far.  No further concerns at this time.       REVIEW OF SYSTEMS: Full 14 system review of systems performed and notable only for those listed, all others are neg:  Constitutional: neg  Cardiovascular: neg Ear/Nose/Throat: Headache Skin: Numbness/tingling Eyes: neg Respiratory: neg Gastroitestinal: neg  Hematology/Lymphatic: neg  Endocrine: neg Musculoskeletal:neg Allergy/Immunology: neg Neurological: Memory loss Psychiatric: Depression  Sleep : Insomnia  ALLERGIES: Allergies  Allergen Reactions  . Imdur [Isosorbide Nitrate] Other (See Comments)    Headaches   . Tramadol Other (See Comments)    Headaches     HOME MEDICATIONS: Outpatient Medications Prior to Visit  Medication Sig Dispense Refill  . acetaminophen (TYLENOL) 325 MG tablet Take 650 mg by mouth every 6 (six) hours as needed (for pain or headaches).     [name] amitriptyline (ELAVIL) 25 MG tablet Take 1  tablet (25 mg total) by mouth at bedtime. 30 tablet 3  . aspirin EC 81 MG EC tablet Take 1 tablet (81 mg total) by mouth daily. 90 tablet 3  . Blood Pressure Monitor KIT 1 Device by Does not apply route 2 (two) times daily. 1 kit 0  . buPROPion (WELLBUTRIN XL) 150 MG 24 hr tablet Take 2 tablets (300 mg total) by mouth daily. 180 tablet 0  . cloNIDine (CATAPRES - DOSED IN MG/24 HR) 0.3 mg/24hr patch Place 1 patch (0.3 mg total) onto the skin once a week. 4 patch 6  . clopidogrel (PLAVIX) 75 MG tablet Take 1 tablet (75 mg total) by mouth daily with breakfast. 90 tablet 3  . Evolocumab (REPATHA SURECLICK) 017 MG/ML SOAJ Inject 1 Dose into the skin every 14 (fourteen) days. 2 pen 11  . furosemide (LASIX) 40 MG tablet Take one tablet daily as needed for shortness of breath or weight gain of more than 3 lbs in one day. (Patient taking differently: Take 40 mg by mouth daily as needed (shortness of breath or weight gain of more than 3 lbs in one day). ) 30 tablet 1  . pantoprazole (PROTONIX) 40 MG tablet Take 1 tablet (40 mg total) by mouth daily. 30 tablet 11  . amLODipine (NORVASC) 10 MG tablet Take 1 tablet (10 mg total) by mouth daily. 30 tablet 0  . carvedilol (COREG) 25 MG tablet Take 1 tablet (25 mg total) by mouth 2 (two) times daily with a meal. 60 tablet 0  .  ezetimibe (ZETIA) 10 MG tablet Take 1 tablet (10 mg total) by mouth daily. 90 tablet 3  . hydrALAZINE (APRESOLINE) 100 MG tablet Take 1 tablet (100 mg total) by mouth every 8 (eight) hours. 90 tablet 0  . isosorbide mononitrate (IMDUR) 60 MG 24 hr tablet Take 1 tablet (60 mg total) by mouth daily. (Patient taking differently: Take 60 mg by mouth daily as needed. ) 30 tablet 6  . rosuvastatin (CRESTOR) 40 MG tablet Take 1 tablet (40 mg total) by mouth daily. 90 tablet 3  . traZODone (DESYREL) 50 MG tablet Take 50 mg by mouth daily.     No facility-administered medications prior to visit.     PAST MEDICAL HISTORY: Past Medical History:   Diagnosis Date  . Adenomatous colon polyp 08/2015  . Anxiety   . Aortic disease ([place])   . Aortic dissection ([facility])    a. admx 04/2014 >> L renal infarct; a/c renal failure >> b.  s/p Bioprosthetic [place] and total arch replacement and staged endovascular repair of descending aortic aneurysm ([facility] - Dr. [name])  . CAD (coronary artery disease)    a. LHC 4/16:  oD1 60%  . Cardiomyopathy ([place])    a. non-ischemic - probably related to untreated HTN and ETOH abuse - Echo 3/13 with EF 35-40% >> b. Echo 4/16: Severe LVH, EF 55-60%, moderate AI, moderate MR, mild LAE, trivial effusion, known type B dissection with communication between true and false lumens with suprasternal images suggesting dissection plane may propagate to at least left subclavian takeoff, root above aortic valve okay    . Chronic abdominal pain   . Chronic combined systolic and diastolic congestive heart failure ([place])    a. 05/2011: Adm with pulm edema/HTN urgency, EF 35-40% with diffuse hypokinesis and moderate to severe mitral regurgitation. Cardiomyopathy likely due to uncontrolled HTN and ETOH abuse - cath deferred due to renal insufficiency (felt due to uncontrolled HTN). bJodie Echevaria MV 06/2011: EF 37% and no ischemia or infarction. c. EF 45-50% by echo 01/2012.  [name] Chronic sinusitis   . CKD (chronic kidney disease)    a. 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  peak creatinine 3.46 during admx for aortic dissection 2/16.  sees Dr [name]  . DDD (degenerative disc disease), lumbar   . Depression   . Descending thoracic aortic aneurysm ([place])   . Dissecting aneurysm of thoracic aorta ([place])   . ETOH abuse    a. Reported to have quit 05/2011.  . Frequent headaches   . GERD (gastroesophageal reflux disease)   . Headache(784.0)    "q other day" (08/08/2013)  . Heart murmur   . Hemorrhoid thrombosis   . History of echocardiogram    Echo 1/17:  Severe LVH, EF 55-60%, no RWMA, Gr 2 DD, AVR ok, mild to mod MR, mild LAE, mild reduced RVSF, mod  RAE  . History of medication noncompliance   . HYPERLIPIDEMIA   . Hypertension    a. Hx of HTN urgency secondary to noncompliance. b. urinary metanephrine and catecholeamine levels normal 2013.  c. Renal art [name] 1/16:  No evidence of renal artery stenosis noted bilaterally.  . INGUINAL HERNIA   . Pneumonia ~ 2013  . Renal insufficiency   . Serrated adenoma of colon 08/2015  . Stroke ([place])   . Tobacco abuse   . Valvular heart disease    a. Echo 05/2011: moderate to severe eccentric MR and mild to moderate AI with prolapsing left coronary cusp. b. Echo 01/2012: mild-mod  AI, mild dilitation of aortic root, mild MR.;  c. Echo 1/16: Severe LVH consistent with hypertrophic cardio myopathy, EF 50%, no RWMA, mod AI, mild MR, mild RAE, dilated Ao root (40 mm);     PAST SURGICAL HISTORY: Past Surgical History:  Procedure Laterality Date  . ANKLE SURGERY Bilateral    Fractures bilaterally  . AORTIC VALVE SURGERY  09/2014  . CORONARY ANGIOGRAPHY N/A 12/06/2017   Procedure: CORONARY ANGIOGRAPHY;  Surgeon: Belva Crome, MD;  Location: [place] [facility];  Service: Cardiovascular;  Laterality: N/A;  . CORONARY ANGIOGRAPHY N/A 09/26/2018   Procedure: CORONARY ANGIOGRAPHY;  Surgeon: Nelva Bush, MD;  Location: [place] [facility];  Service: Cardiovascular;  Laterality: N/A;  . CORONARY STENT INTERVENTION N/A 12/10/2017   Procedure: CORONARY STENT INTERVENTION;  Surgeon: [name] Sine, MD;  Location: [place] [facility];  Service: Cardiovascular;  Laterality: N/A;  . FOOT FRACTURE SURGERY Bilateral 2004-2010   "got pins in both of them"  . HEMORRHOID SURGERY N/A 06/15/2015   Procedure: HEMORRHOIDECTOMY;  Surgeon: Stark Klein, MD;  Location: [place];  Service: General;  Laterality: N/A;  . INGUINAL HERNIA REPAIR Right ~ 1996  . LEFT HEART CATHETERIZATION WITH CORONARY ANGIOGRAM N/A 06/21/2014   Procedure: LEFT HEART CATHETERIZATION WITH CORONARY ANGIOGRAM;  Surgeon: Larey Dresser, MD;  Location: [facility] C[facility]  LAB;  Service: Cardiovascular;  Laterality: N/A;  . LOOP RECORDER INSERTION N/A 06/19/2016   Procedure: Loop Recorder Insertion;  Surgeon: Deboraha Sprang, MD;  Location: [place] [facility];  Service: Cardiovascular;  Laterality: N/A;  . TEE WITHOUT CARDIOVERSION N/A 03/12/2016   Procedure: TRANSESOPHAGEAL ECHOCARDIOGRAM (TEE);  Surgeon: Sanda Klein, MD;  Location: [facility] ENDOSCOPY;  Service: Cardiovascular;  Laterality: N/A;    FAMILY HISTORY: Family History  Problem Relation Age of Onset  . Hypertension Mother   . Hypertension Other   . Colon cancer Paternal Uncle   . Stroke Maternal Aunt   . Heart attack Brother   . Diabetes Maternal Aunt   . Lung cancer Maternal Uncle   . Other Sister        "breathing machine at night"  . Headache Sister   . Thyroid disease Sister   . Throat cancer Neg Hx   . Pancreatic cancer Neg Hx   . Esophageal cancer Neg Hx   . Kidney disease Neg Hx   . Liver disease Neg Hx     SOCIAL HISTORY: Social History   Socioeconomic History  . Marital status: Married    Spouse name: Not on file  . Number of children: 5  . Years of education: 48  . Highest education level: Not on file  Occupational History  . Occupation: Disabled, part-time Programmer, systems    Comment: Disability  Social Needs  . Financial resource strain: Not hard at all  . Food insecurity    Worry: Never true    Inability: Never true  . Transportation needs    Medical: No    Non-medical: No  Tobacco Use  . Smoking status: Current Some Day Smoker    Packs/day: 0.25    Years: 23.00    Pack years: 5.75    Types: Cigarettes  . Smokeless tobacco: Never Used  . Tobacco comment: 3 to 4 per day  Substance and Sexual Activity  . Alcohol use: Yes    Alcohol/week: 1.0 standard drinks    Types: 1 Cans of beer per week    Comment: occasionally  . Drug use: Yes  Types: Marijuana    Comment: 2 times/week  . Sexual activity: Yes  Lifestyle  . Physical activity    Days per week: Not  on file    Minutes per session: Not on file  . Stress: Not on file  Relationships  . Social Herbalist on phone: Not on file    Gets together: Not on file    Attends religious service: Not on file    Active member of club or organization: Not on file    Attends meetings of clubs or organizations: Not on file    Relationship status: Not on file  . Intimate partner violence    Fear of current or ex partner: Not on file    Emotionally abused: Not on file    Physically abused: Not on file    Forced sexual activity: Not on file  Other Topics Concern  . Not on file  Social History Narrative   Fun: Enjoy his children     PHYSICAL EXAM  Vitals:   02/01/19 1426  BP: 136/82  Pulse: 64  Temp: (!) 97.4 F (36.3 C)  TempSrc: Oral  Weight: 163 lb 12.8 oz (74.3 kg)  Height: '5\' 11"'  (1.803 m)   Body mass index is 22.85 kg/m.  Generalized: Well developed, pleasant middle-aged African-American male, in no acute distress  Head: normocephalic and atraumatic,. 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 Attention span and concentration appropriate. Recent and remote memory subjectively diminished.  Follows all commands speech and language fluent.  Cranial nerve II-XII: Pupils were equal round reactive to light extraocular movements were full, visual field were full on confrontational test. Facial sensation and strength were normal. hearing was intact to finger rubbing bilaterally. Uvula tongue midline. head turning and shoulder shrug were normal and symmetric.Tongue protrusion into cheek strength was normal. Motor: normal bulk and tone, full strength in the BUE, BLE, decreased right hand finger  dexterity, no pronator drift. No focal weakness Sensory: Intact to light touch throughout all extremities.  Decreased pinprick, vibration and temperature sensation in right upper and lower extremity compared to left upper and lower extremity Coordination: finger-nose-finger, heel-to-shin bilaterally, decreased finger tapping right hand Reflexes: 1+ upper lower and symmetric plantar responses were flexor bilaterally. Gait and Station: Rising up from seated position without assistance, normal stance,  moderate  able to perform tiptoe, and heel walking without difficulty.      DIAGNOSTIC DATA (LABS, IMAGING, TESTING) - I reviewed patient records, labs, notes, testing and imaging myself where available.  Lab Results  Component Value Date   WBC 4.6 09/27/2018   HGB 13.1 09/27/2018   HCT 39.5 09/27/2018   MCV 95.2 09/27/2018   PLT 132 (L) 09/27/2018      Component Value Date/Time   NA 137 09/28/2018 0247   NA 143 12/23/2017 1539   K 3.7 09/28/2018 0247   CL 105 09/28/2018 0247   CO2 23 09/28/2018 0247   GLUCOSE 92 09/28/2018 0247   BUN 21 (H) 09/28/2018 0247   BUN 33 (H) 12/23/2017 1539   CREATININE 2.22 (H) 09/28/2018 0247   CREATININE 1.52 (H) 06/08/2014 1132   CALCIUM 9.1 09/28/2018 0247   PROT 6.6 12/02/2017 1457   PROT 7.2 06/22/2017 1220   ALBUMIN  3.4 (L) 12/02/2017 1457   ALBUMIN 4.1 06/22/2017 1220   AST 18 12/02/2017 1457   ALT 10 12/02/2017 1457   ALKPHOS 83 12/02/2017 1457   BILITOT 0.8 12/02/2017 1457   BILITOT 0.2 06/22/2017 1220   GFRNONAA 34 (L) 09/28/2018 0247   GFRAA 40 (L) 09/28/2018 0247   Lab Results  Component Value Date   CHOL 165 07/27/2018   HDL 53 07/27/2018   LDLCALC 91 07/27/2018   TRIG 107 07/27/2018   CHOLHDL 3.1 07/27/2018   Lab Results  Component Value Date   HGBA1C 5.3 03/04/2016      ASSESSMENT AND PLAN 48 y.o. African American male with PMH of aortic dissection s/p repair and reconstruction, systolic and diastolic heart failure,  and HTN admitted on 02/28/16 for confusion and right-sided weakness with stroke work-up revealing left MCA embolic infarct. In ED, had seizure and subsequent cardiac arrest.  He had loop recorder placed with Dr. Caryl Comes which has not shown atrial fibrillation thus far.  Residual stroke deficits of right hemisensory impairment with paresthesias, and subjective memory/cognitive impairment.  Concern at prior visit for possible left occipital neuralgia with evidence of possible pilar cyst over occipital nerve root and was referred to dermatology.  Great benefit of headaches and occipital nerve pain with use of amitriptyline 25 mg nightly.  Continues to complain of left-sided postsurgical pain that limits daytime activity    Plan:  -Referral to dermatology for possible removal of questionable pilar cyst which could be pressing on occipital nerve sounds at prior visit.  Provided patient with dermatology office number to schedule initial evaluation -Increase amitriptyline from 25 mg nightly to 50 mg nightly for ongoing paresthesias and left-sided nerve type pains.  Advised him to follow-up with his cardiothoracic surgeon and may possibly benefit from pain management in the future -Follow-up with neuropsychology on 02/23/2019 for subjective cognitive concerns and ongoing depression -Continue aspirin for secondary stroke prevention -Advised to schedule follow-up visit with lipid clinic in regards to restarting Repatha and continue Crestor and Zetia.  He will also have lipid panel obtained at follow-up visit -Continue to monitor loop recorder for atrial fibrillation -Follow up with your primary care physician for stroke risk factor modification. Recommend maintain blood pressure goal 120/80, diabetes with hemoglobin A1c goal below 7.0% and lipids with LDL cholesterol goal below 70 mg/dL.     Follow-up in 6 months or call earlier if needed  Greater than 50% of this 25-minute visit was spent discussing residual  deficits with ongoing paresthesias, discussion regarding continued use of amitriptyline, memory concerns and left-sided postsurgical pain.  Answered all questions to patient satisfaction along with reviewing further treatment plan and interventions.  Frann Rider, AGNP-BC  [facility] [address] [place], [place] [address]  Phone [phone] Fax [phone] Note: This document was prepared with digital dictation and possible smart phrase technology. Any transcriptional errors that result from this process are unintentional.

## 2019-02-01 NOTE — Patient Instructions (Addendum)
Continue aspirin 81 mg daily and clopidogrel 75 mg daily  and Zetia, Crestor and restart Repatha for secondary stroke prevention  Follow-up with lipid clinic in regards to cholesterol management  Continue to follow up with PCP regarding blood pressure and diabetes management   Recommend increasing amitriptyline 50 mg nightly  Schedule appointment with [facility] dermatology [phone]   Maintain strict control of hypertension with blood pressure goal below 130/90, diabetes with hemoglobin A1c goal below 6.5% and cholesterol with LDL cholesterol (bad cholesterol) goal below 70 mg/dL. I also advised the patient to eat a healthy diet with plenty of whole grains, cereals, fruits and vegetables, exercise regularly and maintain ideal body weight.  Followup in the future with me in 6 months or call earlier if needed       Thank you for coming to see [name] at [facility]. I hope we have been able to provide you high quality care today.  You may receive a patient satisfaction survey over the next few weeks. We would appreciate your feedback and comments so that we may continue to improve ourselves and the health of our patients.

## 2019-02-03 NOTE — Progress Notes (Signed)
I agree with the above plan 

## 2019-02-20 ENCOUNTER — Other Ambulatory Visit: Payer: Self-pay

## 2019-02-20 DIAGNOSIS — Z20822 Contact with and (suspected) exposure to covid-19: Secondary | ICD-10-CM

## 2019-02-22 LAB — NOVEL CORONAVIRUS, NAA: SARS-CoV-2, NAA: NOT DETECTED

## 2019-02-23 ENCOUNTER — Encounter: Payer: Medicare HMO | Attending: Psychology | Admitting: Psychology

## 2019-02-23 ENCOUNTER — Encounter: Payer: Self-pay | Admitting: Psychology

## 2019-02-23 ENCOUNTER — Other Ambulatory Visit: Payer: Self-pay

## 2019-02-23 DIAGNOSIS — I6990 Unspecified sequelae of unspecified cerebrovascular disease: Secondary | ICD-10-CM | POA: Insufficient documentation

## 2019-02-23 DIAGNOSIS — F331 Major depressive disorder, recurrent, moderate: Secondary | ICD-10-CM | POA: Diagnosis not present

## 2019-02-23 DIAGNOSIS — I63412 Cerebral infarction due to embolism of left middle cerebral artery: Secondary | ICD-10-CM | POA: Diagnosis not present

## 2019-02-23 NOTE — Progress Notes (Signed)
Neuropsychological Consultation   Patient:   [name]   DOB:   April 17, 1970  MR Number:  [id number]  Location:  [facility] FOR PAIN AND [facility] MEDICINE [facility] PHYSICAL MEDICINE AND REHABILITATION [place], [place] [id number] [place] [id number] Dept: [phone]           Date of Service:   02/23/2019  Start Time:   8 AM End Time:   10 AM  Today's visit was an in person visit that was conducted with the patient myself at my outpatient clinic office.  The first hour was spent in a face-to-face clinical interview with the patient with a second additional hour for records review and report writing.  Provider/Observer:  Ilean Skill, Psy.D.       Clinical Neuropsychologist       Billing Code/Service: 22025 4 Units  Chief Complaint:    [name] is a 48 year old male referred by Frann Rider, NP for neuropsychological consultation including evaluation and therapeutic interventions.  The patient has a history of aortic dissection s/p repair and reconstruction, systolic and diastolic heart failure, hypertension.  The patient had a major event on 02/28/2016 after developing confusion and right-sided weakness.  While in the emergency department he had a seizure and subsequent cardiac arrest.  The patient was intubated and started on Keppra.  Post extubation there were mild right hemiparesis and word finding difficulties.  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  The patient has not had continued seizures and the Keppra was discontinued in 2019.  The patient has also had a number of other medical issues.  They also found an aneurysm and needed to break 5 ribs during the surgery.  The patient has continued to have pain at the site of surgical interventions.  The patient also had a significant left foot injury in the past that is the primary cause of his disability prior and has had pins placed in his foot and continues with pain.  The patient also has a  history of shoulder injury.  The patient has been dealing with symptoms of depression, difficulty with memory and cognition, difficulty times remembering where he is going when he is out driving, paracentesis for his right hand and right foot particularly and disturbance of sleep.  Reason for Service:  [name] is a 48 year old male referred by Frann Rider, NP for neuropsychological consultation including evaluation and therapeutic interventions.  The patient has a history of aortic dissection s/p repair and reconstruction, systolic and diastolic heart failure, hypertension.  The patient had a major event on 02/28/2016 after developing confusion and right-sided weakness.  While in the emergency department he had a seizure and subsequent cardiac arrest.  The patient was intubated and started on Keppra.  Post extubation there were mild right hemiparesis and word finding difficulties.  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  The patient has not had continued seizures and the Keppra was discontinued in 2019.  The patient has also had a number of other medical issues.  They also found an aneurysm and needed to break 5 ribs during the surgery.  The patient has continued to have pain at the site of surgical interventions.  The patient also had a significant left foot injury in the past that is the primary cause of his disability prior and has had pins placed in his foot and continues with pain.  The patient also has a history of shoulder injury.  The patient has been  dealing with symptoms of depression, difficulty with memory and cognition, difficulty times remembering where he is going when he is out driving, paracentesis for his right hand and right foot particularly and disturbance of sleep.  The patient did participate in rehabilitative efforts after his stroke but continues to have significant paracentesis in his right hand and right foot particular.  The patient describes significant memory changes which  make it hard when he goes places he forgets where he is supposed to be going.  The patient also describes difficulties with spelling and reading comprehension that are new since the stroke.  There is likely been involvement in his left PTO junction region and the patient describes difficulties with comprehension and putting "the pieces together."  Reliability of Information: The information is derived from 1 hour face-to-face clinical interview with the patient as well as review of available medical records.  Behavioral Observation: [name]  presents as a 48 y.o.-year-old Right African American Male who appeared his stated age. his dress was Appropriate and he was Well Groomed and his manners were Appropriate to the situation.  his participation was indicative of Appropriate and Attentive behaviors.  There were any physical disabilities noted.  he displayed an appropriate level of cooperation and motivation.     Interactions:    Active Appropriate and Attentive  Attention:   within normal limits and attention span and concentration were age appropriate  Memory:   abnormal; remote memory intact, recent memory impaired  Visuo-spatial:  not examined  Speech (Volume):  normal  Speech:   normal; normal  Thought Process:  Coherent and Relevant  Though Content:  WNL; not suicidal and not homicidal  Orientation:   person, place, time/date and situation  Judgment:   Good  Planning:   Good  Affect:    Depressed and Tearful  Mood:    Dysphoric  Insight:   Good  Intelligence:   high  Current Employment: The patient is not currently working and is on disability.  While the stated reason for his Social Security disability had to do with deficits from his left ankle injury in the past the patient continues to have significant residual cognitive deficits and memory deficits as well as paracentesis that have left him unable to work.  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  Substance Use:  There is a documented history of tobacco abuse confirmed by the patient.  No other substance abuse noted.  Education:   HS Graduate  Medical History:   Past Medical History:  Diagnosis Date  . Adenomatous colon polyp 08/2015  . Anxiety   . Aortic disease ([place])   . Aortic dissection (HCC)    a. admx 04/2014 >> L renal infarct; a/c renal failure >> b.  s/p Bioprosthetic [place] and total arch replacement and staged endovascular repair of descending aortic aneurysm ([facility] - Dr. [name])  . CAD (coronary artery disease)    a. LHC 4/16:  oD1 60%  . Cardiomyopathy ([place])    a. non-ischemic - probably related to untreated HTN and ETOH abuse - Echo 3/13 with EF 35-40% >> b. Echo 4/16: Severe LVH, EF 55-60%, moderate AI, moderate MR, mild LAE, trivial effusion, known type B dissection with communication between true and false lumens with suprasternal images suggesting dissection plane may propagate to at least left subclavian takeoff, root above aortic  valve okay    . Chronic abdominal pain   . Chronic combined systolic and diastolic congestive heart failure ([place])    a. 05/2011: Adm with pulm edema/HTN urgency, EF 35-40% with diffuse hypokinesis and moderate to severe mitral regurgitation. Cardiomyopathy likely due to uncontrolled HTN and ETOH abuse - cath deferred due to renal insufficiency (felt due to uncontrolled HTN). bJodie Echevaria MV 06/2011: EF 37% and no ischemia or infarction. c. EF 45-50% by echo 01/2012.  [name] Chronic sinusitis   . CKD (chronic kidney disease)    a. 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  peak creatinine 3.46 during admx for aortic dissection 2/16.  sees Dr [name]  . DDD (degenerative disc disease), lumbar   . Depression   . Descending thoracic aortic aneurysm ([place])   . Dissecting aneurysm of thoracic aorta ([place])   . ETOH abuse    a.  Reported to have quit 05/2011.  . Frequent headaches   . GERD (gastroesophageal reflux disease)   . Headache(784.0)    "q other day" (08/08/2013)  . Heart murmur   . Hemorrhoid thrombosis   . History of echocardiogram    Echo 1/17:  Severe LVH, EF 55-60%, no RWMA, Gr 2 DD, AVR ok, mild to mod MR, mild LAE, mild reduced RVSF, mod RAE  . History of medication noncompliance   . HYPERLIPIDEMIA   . Hypertension    a. Hx of HTN urgency secondary to noncompliance. b. urinary metanephrine and catecholeamine levels normal 2013.  c. Renal art [name] 1/16:  No evidence of renal artery stenosis noted bilaterally.  . INGUINAL HERNIA   . Pneumonia ~ 2013  . Renal insufficiency   . Serrated adenoma of colon 08/2015  . Stroke ([place])   . Tobacco abuse   . Valvular heart disease    a. Echo 05/2011: moderate to severe eccentric MR and mild to moderate AI with prolapsing left coronary cusp. b. Echo 01/2012: mild-mod AI, mild dilitation of aortic root, mild MR.;  c. Echo 1/16: Severe LVH consistent with hypertrophic cardio myopathy, EF 50%, no RWMA, mod AI, mild MR, mild RAE, dilated Ao root (40 mm);    Psychiatric History:  No prior psychiatric history before his stroke and other medical difficulties.  Family Med/Psych History:  Family History  Problem Relation Age of Onset  . Hypertension Mother   . Hypertension Other   . Colon cancer Paternal Uncle   . Stroke Maternal Aunt   . Heart attack Brother   . Diabetes Maternal Aunt   . Lung cancer Maternal Uncle   . Other Sister        "breathing machine at night"  . Headache Sister   . Thyroid disease Sister   . Throat cancer Neg Hx   . Pancreatic cancer Neg Hx   . Esophageal cancer Neg Hx   . Kidney disease Neg Hx   . Liver disease Neg Hx     Risk of Suicide/Violence: low the patient denies any suicidal or homicidal ideation.  Impression/DX:  [name] is a 48 year old male referred by Frann Rider, NP for neuropsychological consultation  including evaluation and therapeutic interventions.  The patient has a history of aortic dissection s/p repair and reconstruction, systolic and diastolic heart failure, hypertension.  The patient had a major event on 02/28/2016 after developing confusion and right-sided weakness.  While in the emergency department he had a seizure and subsequent cardiac arrest.  The patient was intubated and started on Keppra.  Post extubation there were  mild right hemiparesis and word finding difficulties.  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  The patient has not had continued seizures and the Keppra was discontinued in 2019.  The patient has also had a number of other medical issues.  They also found an aneurysm and needed to break 5 ribs during the surgery.  The patient has continued to have pain at the site of surgical interventions.  The patient also had a significant left foot injury in the past that is the primary cause of his disability prior and has had pins placed in his foot and continues with pain.  The patient also has a history of shoulder injury.  The patient has been dealing with symptoms of depression, difficulty with memory and cognition, difficulty times remembering where he is going when he is out driving, paracentesis for his right hand and right foot particularly and disturbance of sleep.  Disposition/Plan:  We have set the patient up for formal neuropsychological testing to get a more objective assessment of memory and cognitive deficits.  The patient will be administered the Wechsler memory scale-IV as well as the Wechsler adult intelligence scale-IV.  The patient has also been set up for therapeutic interventions around residual effects of his stroke and symptoms of depression and coping issues.  While the patient reports that he is continuing to work hard and has not given up his difficulties with his continued pain, cognitive difficulties affecting his cognition, awareness, spelling and reading issues  along with memory are all making it hard for him to cope.  Diagnosis:    Cerebral infarction due to embolism of left middle cerebral artery (HCC)  Late effects of cerebrovascular accident  Moderate episode of recurrent major depressive disorder ([place])         Electronically Signed   _______________________ Ilean Skill, Psy.D.

## 2019-03-03 ENCOUNTER — Encounter: Payer: Medicare HMO | Admitting: Psychology

## 2019-03-13 ENCOUNTER — Telehealth: Payer: Self-pay | Admitting: *Deleted

## 2019-03-13 DIAGNOSIS — G4452 New daily persistent headache (NDPH): Secondary | ICD-10-CM

## 2019-03-13 DIAGNOSIS — R202 Paresthesia of skin: Secondary | ICD-10-CM

## 2019-03-13 MED ORDER — AMITRIPTYLINE HCL 50 MG PO TABS: 50.0000 mg | ORAL_TABLET | Freq: Every day | ORAL | 3 refills | Status: DC

## 2019-03-13 NOTE — Telephone Encounter (Signed)
As discussed at prior visit, amitriptyline dosage increased from 25 mg to 50 mg daily due to ongoing headaches and paresthesias.

## 2019-03-13 NOTE — Telephone Encounter (Signed)
Noted  

## 2019-03-20 ENCOUNTER — Encounter: Payer: Self-pay | Admitting: Psychology

## 2019-03-20 ENCOUNTER — Encounter: Payer: Medicare HMO | Attending: Psychology | Admitting: Psychology

## 2019-03-20 ENCOUNTER — Other Ambulatory Visit: Payer: Self-pay

## 2019-03-20 DIAGNOSIS — I63412 Cerebral infarction due to embolism of left middle cerebral artery: Secondary | ICD-10-CM | POA: Diagnosis not present

## 2019-03-20 DIAGNOSIS — F331 Major depressive disorder, recurrent, moderate: Secondary | ICD-10-CM | POA: Insufficient documentation

## 2019-03-20 DIAGNOSIS — I6990 Unspecified sequelae of unspecified cerebrovascular disease: Secondary | ICD-10-CM | POA: Diagnosis present

## 2019-03-20 NOTE — Progress Notes (Signed)
The patient arrived on time to his 8:00 testing appointment, which lasted 240 minutes.   Behavioral Observations:  Appearance: Casually and appropriately dressed with good hygiene. Gait: Ambulated independently without assistance Speech: Clear, normal rate, normal tone & volume Thought process:  Linear, disorganized, concrete.   Mood/Affect:  Depressed, appropriate  Interpersonal: Polite and appropriate. Orientation: Oriented x 4 Effort/Motivation: Good   He did not appear to have difficulty seeing, hearing, or understanding test items/questions but did require additional prompting on most measures (e.g. repeated questions/instructions). He exhibited reduced distress tolerance (e.g. mildly frustrated) on questions he did not know or tasks that were more difficult.   Tests Administered: . Clock Drawing Test . Wechsler Adult Intelligence Scale, 4th Edition (WAIS-IV) . Wechsler Memory Scale, 4th edition, Adult Battery (WMS-IV-A)  Results:  Clock Drawing Test  . WNL   WAIS-IV  Composite Score Summary  Scale Sum of Scaled Scores Composite Score Percentile Rank 95% Conf. Interval Qualitative Description  Verbal Comprehension 14 VCI 70 2 66-77 Borderline  Perceptual Reasoning 20 PRI 81 10 76-88 Low Average  Working Memory 11 WMI 74 4 69-82 Borderline  Processing Speed 13 PSI 81 10 75-91 Low Average  Full Scale 58 FSIQ 72 3 68-77 Borderline  General Ability 34 GAI 73 4 69-79 Borderline   Index Level Discrepancy Comparisons  Comparison Score 1 Score 2 Difference Critical Value .05 Significant Difference Y/N Base Rate by Overall Sample  VCI - PRI 70 81 -11 7.78 Y 21.6  VCI - WMI 70 74 -4 8.31 N 38.8  VCI - PSI 70 81 -11 11.76 N 24.7  PRI - WMI 81 74 7 8.81 N 30.8  PRI - PSI 81 81 0 12.12 N   WMI - PSI 74 81 -7 12.47 N 32.5  FSIQ - GAI 72 73 -1 3.29 N 44.9    WMS-IV (Adult Battery)   Index Score Summary  Index Sum of Scaled Scores Index Score Percentile Rank 95%  Confidence Interval Qualitative Descriptor  Auditory Memory (AMI) 19 69 2 64-77 Extremely Low  Visual Memory (VMI) 29 84 14 79-90 Low Average  Visual Working Memory (VWMI) 16 88 21 82-96 Low Average  Immediate Memory (IMI) 19 65 1 61-73 Extremely Low  Delayed Memory (DMI) 29 81 10 75-89 Low Average   Primary Subtest Scaled Score Summary  Subtest Domain Raw Score Scaled Score Percentile Rank  Logical Memory I AM 11 3 1   Logical Memory II AM 10 5 5   Verbal [employer] Associates I AM 11 4 2   Verbal [employer] II AM 6 7 16   Designs I VM 41 4 2  Designs II VM 51 9 37  Visual Reproduction I VM 31 8 25   Visual Reproduction II VM 17 8 25   Spatial Addition VWM 11 9 37  Symbol Span VWM 16 7 16    PROCESS SCORE CONVERSIONS  Auditory Memory Process Score Summary  Process Score Raw Score Scaled Score Percentile Rank Cumulative Percentage (Base Rate)  LM II Recognition 19 - - 3-9%  VPA II Recognition 35 - - 10-16%   Visual Memory Process Score Summary  Process Score Raw Score Scaled Score Percentile Rank Cumulative Percentage (Base Rate)  DE I Content 24 4 2  -  DE I Spatial 13 7 16  -  DE II Content 33 10 50 -  DE II Spatial 12 10 50 -  DE II Recognition 10 - - 3-9%  VR II Recognition 6 - - 51-75%   ABILITY-MEMORY ANALYSIS  Ability Score:  PRI: 81 Date of Testing:  WAIS-IV; WMS-IV 2019/03/20  Predicted Difference Method   Index Predicted WMS-IV Index Score Actual WMS-IV Index Score Difference Critical Value  Significant Difference Y/N Base Rate  Auditory Memory 92 69 23 9.93 Y 5%  Visual Memory 88 84 4 10.21 N   Visual Working Memory 87 88 -1 11.66 N   Immediate Memory 88 65 23 11.04 Y 2-3%  Delayed Memory 90 81 9 10.84 N   Statistical significance (critical value) at the .01 level.   Contrast Scaled Scores  Score Score 1 Score 2 Contrast Scaled Score  General Ability Index vs. Auditory Memory Index 73 69 6  General Ability Index vs. Visual Memory Index 73 84 10   General Ability Index vs. Visual Working Memory Index 73 88 12  General Ability Index vs. Immediate Memory Index 73 65 6  General Ability Index vs. Delayed Memory Index 73 81 8  Verbal Comprehension Index vs. Auditory Memory Index 70 69 6  Perceptual Reasoning Index vs. Visual Memory Index 81 84 9  Perceptual Reasoning Index vs. Visual Working Memory Index 81 88 11  Working Memory Index vs. Auditory Memory Index 74 69 6  Working Memory Index vs. Visual Working Memory Index 74 88 12

## 2019-03-21 ENCOUNTER — Encounter: Payer: Self-pay | Admitting: Internal Medicine

## 2019-03-21 ENCOUNTER — Ambulatory Visit (INDEPENDENT_AMBULATORY_CARE_PROVIDER_SITE_OTHER): Payer: Medicare HMO | Admitting: Internal Medicine

## 2019-03-21 VITALS — BP 180/100 | HR 66 | Temp 97.3°F | Ht 71.0 in | Wt 160.8 lb

## 2019-03-21 DIAGNOSIS — Z720 Tobacco use: Secondary | ICD-10-CM

## 2019-03-21 DIAGNOSIS — J449 Chronic obstructive pulmonary disease, unspecified: Secondary | ICD-10-CM | POA: Diagnosis not present

## 2019-03-21 DIAGNOSIS — J441 Chronic obstructive pulmonary disease with (acute) exacerbation: Secondary | ICD-10-CM

## 2019-03-21 DIAGNOSIS — F17210 Nicotine dependence, cigarettes, uncomplicated: Secondary | ICD-10-CM | POA: Diagnosis not present

## 2019-03-21 MED ORDER — ALBUTEROL SULFATE HFA 108 (90 BASE) MCG/ACT IN AERS: 2.0000 | INHALATION_SPRAY | Freq: Four times a day (QID) | RESPIRATORY_TRACT | 5 refills | Status: DC | PRN

## 2019-03-21 MED ORDER — STIOLTO RESPIMAT 2.5-2.5 MCG/ACT IN AERS: 2.0000 | INHALATION_SPRAY | Freq: Every day | RESPIRATORY_TRACT | 5 refills | Status: DC

## 2019-03-21 NOTE — Patient Instructions (Addendum)
The patient should have follow up scheduled with myself in 5 months.  Starting [employer] - 2 puffs twice a day which you take no matter what Start taking albuterol which is a rescue inhaler.  ------------------------------------------------------------------------------------------------------------------- Metered Dose Inhaler (MDI) Instructions (Albuterol)  Before using your inhaler for the first time: 1. Take the cap off the mouthpiece. 2. Shake the inhaler for 5 seconds 3. Press down on the canister to spray the medicine into the air. 4. Repeat these steps 3 more times. If you haven't used your inhaler in more than 2 weeks, repeat these steps before using it.  To use your inhaler: 1. Take the cap off the mouthpiece 2. Shake the inhaler for 5 seconds. 3. Hold it upright with your finger on the top of the canister and your thumb on the bottom of the inhaler. 4. Breathe out. 5. Close your lips around the mouthpiece. 6. As you start to inhale the next breath, press down on the canister. 7. Inhale deeply and slowly through your mouth. 8. Hold your breath for 5 to 10 seconds to keep the medicine in your lungs. 9. Let your breath out.   10. Repeat these steps if you are supposed to take 2 puffs.  11. Put the cap back on the mouthpiece 12. Remember to rinse, gargle and spit with water after use if your inhaler has a steroid in it (Advair, Symbicort, Dulera, Qvar, Flovent)  Checking your MDI It is important that you know how much medication is left in your inhaler. The number of puffs contained in your MDI is printed on the side of the canister. After you have used that number of puffs, you must discard your inhaler even if it continues to spray. Keep track of how many puffs you have used. You also must include priming puffs in this total. If you use an MDI every day for control of symptoms, you can determine how long it will last by dividing the total number of puffs in the MDI by the total  puffs you use every day. For example: 2 puffs x 2 times per day = 4 total puffs per day. At 120 puffs, the MDI will last 30 days. If you use an inhaler only when you need to, you must keep track of how many times you spray the inhaler. Some of the newer MDIs have counting devices built in.  If your MDI does not have a dose counter, you can obtain a device that attaches to the MDI and counts down the number of puffs each time you press the inhaler. Ask your health care professional for more information about these devices, as well as how to best keep track of your medicine without an add-on device (if you prefer).     Understanding COPD   What is COPD? COPD stands for chronic obstructive pulmonary (lung) disease. COPD is a general term used for several lung diseases.  COPD is an umbrella term and encompasses other  common diseases in this group like chronic bronchitis and emphysema. Chronic asthma may also be included in this group. While some patients with COPD have only chronic bronchitis or emphysema, most patients have a combination of both.  You might hear these terms used in exchange for one another.   COPD adds to the work of the heart. Diseased lungs may reduce the amount of oxygen that goes to the blood. High blood pressure in blood vessels from the heart to the lungs makes it difficult for the heart  to pump. Lung disease can also cause the body to produce too many red blood cells which may make the blood thicker and harder to pump.   Patients who have COPD with low oxygen levels may develop an enlarged heart (cor pulmonale). This condition weakens the heart and causes increased shortness of breath and swelling in the legs and feet.   Chronic bronchitis Chronic bronchitis is irritation and inflammation (swelling) of the lining in the bronchial tubes (air passages). The irritation causes coughing and an excess amount of mucus in the airways. The swelling makes it difficult to get air in and  out of the lungs. The small, hair-like structures on the inside of the airways (called cilia) may be damaged by the irritation. The cilia are then unable to help clean mucus from the airways.  Bronchitis is generally considered to be chronic when you have: a productive cough (cough up mucus) and shortness of breath that lasts about 3 months or more each year for 2 or more years in a row. Your doctor may define chronic bronchitis differently.   Emphysema Emphysema is the destruction, or breakdown, of the walls of the alveoli (air sacs) located at the end of the bronchial tubes. The damaged alveoli are not able to exchange oxygen and carbon dioxide between the lungs and the blood. The bronchioles lose their elasticity and collapse when you exhale, trapping air in the lungs. The trapped air keeps fresh air and oxygen from entering the lungs.   Who is affected by COPD? Emphysema and chronic bronchitis affect approximately 16 million people in the [place], or close to 11 percent of the population.   Symptoms of COPD   Shortness of breath   Shortness of breath with mild exercise (walking, using the stairs, etc.)   Chronic, productive cough (with mucus)   A feeling of "tightness" in the chest   Wheezing   What causes COPD? The two primary causes of COPD are cigarette smoking and alpha1-antitrypsin (AAT) deficiency. Air pollution and occupational dusts may also contribute to COPD, especially when the person exposed to these substances is a cigarette smoker.  Cigarette smoke causes COPD by irritating the airways and creating inflammation that narrows the airways, making it more difficult to breathe. Cigarette smoke also causes the cilia to stop working properly so mucus and trapped particles are not cleaned from the airways. As a result, chronic cough and excess mucus production develop, leading to chronic bronchitis.  In some people, chronic bronchitis and infections can lead to destruction of  the small airways, or emphysema.  AAT deficiency, an inherited disorder, can also lead to emphysema. Alpha antitrypsin (AAT) is a protective material produced in the liver and transported to the lungs to help combat inflammation. When there is not enough of the chemical AAT, the body is no longer protected from an enzyme in the white blood cells.   How is COPD diagnosed?  To diagnose COPD, the physician needs to know: . Do you smoke?  . Have you had chronic exposure to dust or air pollutants?  . Do other members of your family have lung disease?  [name] Are you short of breath?  . Do you get short of breath with exercise?  [name] Do you have chronic cough and/or wheezing?  [name] Do you cough up excess mucus?  To help with the diagnosis, the physician will conduct a thorough physical exam which includes:  1. Listening to your lungs and heart  2. Checking your blood pressure  and pulse  3. Examining your nose and throat  4. Checking your feet and ankles for swelling   Laboratory and other tests Several laboratory and other tests are needed to confirm a diagnosis of COPD. These tests may include:  . Chest X-ray to look for lung changes that could be caused by COPD  .  Spirometry and pulmonary function tests (PFTs) to determine lung volume and air flow  . Pulse oximetry to measure the saturation of oxygen in the blood  . Arterial blood gases (ABGs) to determine the amount of oxygen and carbon dioxide in the blood  . Exercise testing to determine if the oxygen level in the blood drops during exercise   Treatment In the beginning stages of COPD, there is minimal shortness of breath that may be noticed only during exercise. As the disease progresses, shortness of breath may worsen and you may need to wear an oxygen device.   To help control other symptoms of COPD, the following treatments and lifestyle changes may be prescribed.  . Quitting smoking  . Avoiding cigarette smoke and other irritants  . Taking  medications including: a. bronchodilators b. anti-inflammatory agents c. oxygen d. antibiotics  . Maintaining a healthy diet  . Following a structured exercise program such as pulmonary rehabilitation . Preventing respiratory infections  . Controlling stress   If your COPD progresses, you may be eligible to be evaluated for lung volume reduction surgery or lung transplantation. You may also be eligible to participate in certain clinical trials (research studies). Ask your health care providers about studies being conducted in your hospital.   What is the outlook? Although COPD can not be cured, its symptoms can be treated and your quality of life can be improved. Your prognosis or outlook for the future will depend on how well your lungs are functioning, your symptoms, and how well you respond to and follow your treatment plan.

## 2019-03-21 NOTE — Progress Notes (Signed)
[name]    [id number]    1970-03-24  Primary Care Physician:Edwards, Milford Cage, NP  Referring Physician: Kerin Perna, NP [address] [place],  [place] [address] Reason for Consultation: COPD Date of Consultation: 03/21/2019  Chief complaint:   Chief Complaint  Patient presents with  . Consult    Patient is here for COPD. Patient also has shortness of breath that comes and goes for the last 2 years.      HPI:  Short of breath with daily activities such as grocery shopping, running errands. Seems to have gotten worse since he had his surgery. Had AAA repair 2 years ago with graft repair. Symptoms resolve when he stops to rest. If he does anything in a rush he gets sob. He denies coughing or wheezing.  His BP is 180/100 and thinks he forgot to take his medicine.    Social history:  Occupation: Engineering geologist, on disability.  Exposures: lives at home with wife and daughter Smoking history: 1 ppd x 20 years, still smoking. Has cut back Also has history of heavy alcohol use - now has cut back.   Social History   Occupational History  . Occupation: Disabled, part-time Programmer, systems    Comment: Disability  Tobacco Use  . Smoking status: Current Some Day Smoker    Packs/day: 0.25    Years: 23.00    Pack years: 5.75    Types: Cigarettes  . Smokeless tobacco: Never Used  . Tobacco comment: 3 to 4 per day  Substance and Sexual Activity  . Alcohol use: Yes    Alcohol/week: 1.0 standard drinks    Types: 1 Cans of beer per week    Comment: occasionally  . Drug use: Yes    Types: Marijuana    Comment: 2 times/week  . Sexual activity: Yes    Relevant family history:  Family History  Problem Relation Age of Onset  . Hypertension Mother   . Hypertension Other   . Colon cancer Paternal Uncle   . Stroke Maternal Aunt   . Heart attack Brother   . Diabetes Maternal Aunt   . Lung cancer Maternal Uncle   . Other Sister        "breathing  machine at night"  . Headache Sister   . Thyroid disease Sister   . Throat cancer Neg Hx   . Pancreatic cancer Neg Hx   . Esophageal cancer Neg Hx   . Kidney disease Neg Hx   . Liver disease Neg Hx     Past Medical History:  Diagnosis Date  . Adenomatous colon polyp 08/2015  . Anxiety   . Aortic disease ([place])   . Aortic dissection ([facility])    a. admx 04/2014 >> L renal infarct; a/c renal failure >> b.  s/p Bioprosthetic [place] and total arch replacement and staged endovascular repair of descending aortic aneurysm ([facility] - Dr. [name])  . CAD (coronary artery disease)    a. LHC 4/16:  oD1 60%  . Cardiomyopathy ([place])    a. non-ischemic - probably related to untreated HTN and ETOH abuse - Echo 3/13 with EF 35-40% >> b. Echo 4/16: Severe LVH, EF 55-60%, moderate AI, moderate MR, mild LAE, trivial effusion, known type B dissection with communication between true and false lumens with suprasternal images suggesting dissection plane may propagate to at least left subclavian takeoff, root above aortic valve okay    . Chronic abdominal pain   .  Chronic combined systolic and diastolic congestive heart failure ([place])    a. 05/2011: Adm with pulm edema/HTN urgency, EF 35-40% with diffuse hypokinesis and moderate to severe mitral regurgitation. Cardiomyopathy likely due to uncontrolled HTN and ETOH abuse - cath deferred due to renal insufficiency (felt due to uncontrolled HTN). bJodie Echevaria MV 06/2011: EF 37% and no ischemia or infarction. c. EF 45-50% by echo 01/2012.  [name] Chronic sinusitis   . CKD (chronic kidney disease)    a. 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  peak creatinine 3.46 during admx for aortic dissection 2/16.  sees Dr [name]  . DDD (degenerative disc disease), lumbar   . Depression   . Descending thoracic aortic aneurysm ([place])   . Dissecting aneurysm of thoracic aorta ([place])   . ETOH abuse    a. Reported to have quit 05/2011.  . Frequent headaches   . GERD (gastroesophageal reflux disease)   .  Headache(784.0)    "q other day" (08/08/2013)  . Heart murmur   . Hemorrhoid thrombosis   . History of echocardiogram    Echo 1/17:  Severe LVH, EF 55-60%, no RWMA, Gr 2 DD, AVR ok, mild to mod MR, mild LAE, mild reduced RVSF, mod RAE  . History of medication noncompliance   . HYPERLIPIDEMIA   . Hypertension    a. Hx of HTN urgency secondary to noncompliance. b. urinary metanephrine and catecholeamine levels normal 2013.  c. Renal art [name] 1/16:  No evidence of renal artery stenosis noted bilaterally.  . INGUINAL HERNIA   . Pneumonia ~ 2013  . Renal insufficiency   . Serrated adenoma of colon 08/2015  . Stroke ([place])   . Tobacco abuse   . Valvular heart disease    a. Echo 05/2011: moderate to severe eccentric MR and mild to moderate AI with prolapsing left coronary cusp. b. Echo 01/2012: mild-mod AI, mild dilitation of aortic root, mild MR.;  c. Echo 1/16: Severe LVH consistent with hypertrophic cardio myopathy, EF 50%, no RWMA, mod AI, mild MR, mild RAE, dilated Ao root (40 mm);     Past Surgical History:  Procedure Laterality Date  . ANKLE SURGERY Bilateral    Fractures bilaterally  . AORTIC VALVE SURGERY  09/2014  . CORONARY ANGIOGRAPHY N/A 12/06/2017   Procedure: CORONARY ANGIOGRAPHY;  Surgeon: Belva Crome, MD;  Location: [facility];  Service: Cardiovascular;  Laterality: N/A;  . CORONARY ANGIOGRAPHY N/A 09/26/2018   Procedure: CORONARY ANGIOGRAPHY;  Surgeon: Nelva Bush, MD;  Location: [facility];  Service: Cardiovascular;  Laterality: N/A;  . CORONARY STENT INTERVENTION N/A 12/10/2017   Procedure: CORONARY STENT INTERVENTION;  Surgeon: [name] Sine, MD;  Location: [facility];  Service: Cardiovascular;  Laterality: N/A;  . FOOT FRACTURE SURGERY Bilateral 2004-2010   "got pins in both of them"  . HEMORRHOID SURGERY N/A 06/15/2015   Procedure: HEMORRHOIDECTOMY;  Surgeon: Stark Klein, MD;  Location: [facility];  Service: General;  Laterality: N/A;  . INGUINAL  HERNIA REPAIR Right ~ 1996  . LEFT HEART CATHETERIZATION WITH CORONARY ANGIOGRAM N/A 06/21/2014   Procedure: LEFT HEART CATHETERIZATION WITH CORONARY ANGIOGRAM;  Surgeon: Larey Dresser, MD;  Location: [facility] CATH LAB;  Service: Cardiovascular;  Laterality: N/A;  . LOOP RECORDER INSERTION N/A 06/19/2016   Procedure: Loop Recorder Insertion;  Surgeon: Deboraha Sprang, MD;  Location: [facility];  Service: Cardiovascular;  Laterality: N/A;  . TEE WITHOUT CARDIOVERSION N/A 03/12/2016   Procedure: TRANSESOPHAGEAL ECHOCARDIOGRAM (TEE);  Surgeon: Sanda Klein, MD;  Location: [facility] ENDOSCOPY;  Service: Cardiovascular;  Laterality: N/A;     Review of systems: Review of Systems  Constitutional: Negative for chills, fever and weight loss.  HENT: Negative for congestion, sinus pain and sore throat.   Eyes: Negative for discharge and redness.  Respiratory: Positive for shortness of breath. Negative for cough, hemoptysis, sputum production and wheezing.   Cardiovascular: Positive for chest pain. Negative for palpitations and leg swelling.  Gastrointestinal: Negative for heartburn, nausea and vomiting.  Musculoskeletal: Negative for joint pain and myalgias.  Skin: Negative for rash.  Neurological: Negative for dizziness, tremors, focal weakness and headaches.  Endo/Heme/Allergies: Negative for environmental allergies.  Psychiatric/Behavioral: Negative for depression. The patient is not nervous/anxious.   All other systems reviewed and are negative.   Physical Exam: Blood pressure (!) 180/100, pulse 66, temperature (!) 97.3 F (36.3 C), temperature source Temporal, height 5\' 11"  (1.803 m), weight 160 lb 12.8 oz (72.9 kg), SpO2 98 %. Gen:      No acute distress Eyes: EOMI, sclera anicteric ENT:  no nasal polyps, mucus membranes moist Neck:     Supple, no thyromegaly Lungs:    No increased respiratory effort, symmetric chest wall excursion, clear to auscultation bilaterally, no wheezes or crackles CV:          There is an S4, and a soft systolic murmur.  Regular rate and rhythm; no murmurs, rubs, or gallops.  No pedal edema Abd:      + bowel sounds; soft, non-tender; no distension MSK: no acute synovitis of DIP or PIP joints, no mechanics hands.  Skin:      Warm and dry; no rashes Neuro: normal speech, no focal facial asymmetry Psych: alert and oriented x3, normal mood and affect  Data Reviewed: Echo July 2020   1. The left ventricle has mildly reduced systolic function, with an ejection fraction of 45-50%. The cavity size was normal. There is severe concentric left ventricular hypertrophy. Left ventricular diastolic Doppler parameters are consistent with  impaired relaxation.  2. The LV is severely thickened and has a bright speckled pattern. Consider amyloidosis.  3. The right ventricle has normal systolic function. The cavity was normal. There is moderately increased right ventricular wall thickness.  4. The mitral valve is abnormal. Mild thickening of the mitral valve leaflet. No evidence of mitral valve stenosis.  5. The tricuspid valve is grossly normal.  6. No stenosis of the aortic valve.  7. There is mild dilatation of the ascending aorta.  8. The average left ventricular global longitudinal strain is -8.3 %.  9. The interatrial septum was not assessed.  Imaging: I have personally reviewed the CT angio performed July 2020.  There is stable repair of an aortic aneurysm.  There is no endoleak.  The lung parenchyma are without significant nodules, emphysema, atelectasis or effusion.  I have personally reviewed the patient's PFTs and the pattern is mostly 1 of restriction which seems secondary to patient's pain with deep inspiration.  There was no significant airflow limitation, and no significant bronchodilator response.  Diffusion capacity was reduced, but this is secondary to decreased inspiratory effort. PFTs:  PFT Results Latest Ref Rng & Units 09/19/2018  FVC-Pre L 2.98    FVC-Predicted Pre % 67  FVC-Post L 2.85  FVC-Predicted Post % 64  Pre FEV1/FVC % % 72  Post FEV1/FCV % % 76  FEV1-Pre L 2.14  FEV1-Predicted Pre % 59  FEV1-Post L 2.17  DLCO UNC% % 48  DLCO COR %Predicted % 81  TLC L 4.60  TLC % Predicted % 64  RV % Predicted % 84     Labs:  Immunization status: Immunization History  Administered Date(s) Administered  . Tdap 10/04/2018     Assessment:  Shortness of breath Chest wall pain secondary to thoracic surgery Heart failure reduced ejection fraction Essential hypertension, poorly controlled Tobacco use disorder, 25 pack years Every day tobacco use  Plan/Recommendations: Mr. Frutoso Chase has shortness of breath with minimal exertion which seems to be limiting his daily activities.  Is difficult to ascertain how much of his symptoms are secondary to pain with deep inspiration, and how much may be secondary to his tobacco use and presumptive COPD.  After discussion with the patient we have elected to start treating his COPD with LAMA LABA inhaler daily controlled, as well as as needed albuterol.  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    I personally spent 5 minutes counseling the patient regarding tobacco use disorder.  Patient is symptomatic from tobacco use disorder due to the following condition: COPD.  The patient's response was contemplative and he is trying patches at home.  We discussed nicotine replacement therapy, Wellbutrin, Chantix.  We identified to gather patient specific barriers to change.  The patient is open to future discussions about tobacco cessation.  I spent 49 minutes on 03/21/2019 in care of this patient including face to face time and non-face to face time spent charting, review of outside records, and coordination of care.   Return to Care: Return in about 5 months (around 08/19/2019).  Lenice Llamas, MD Pulmonary and [place]  CC: Kerin Perna, NP

## 2019-03-23 ENCOUNTER — Encounter: Payer: Self-pay | Admitting: Psychology

## 2019-03-23 ENCOUNTER — Encounter (HOSPITAL_BASED_OUTPATIENT_CLINIC_OR_DEPARTMENT_OTHER): Payer: Medicare HMO | Admitting: Psychology

## 2019-03-23 ENCOUNTER — Other Ambulatory Visit: Payer: Self-pay

## 2019-03-23 DIAGNOSIS — I6990 Unspecified sequelae of unspecified cerebrovascular disease: Secondary | ICD-10-CM | POA: Diagnosis not present

## 2019-03-23 DIAGNOSIS — I63412 Cerebral infarction due to embolism of left middle cerebral artery: Secondary | ICD-10-CM

## 2019-03-23 DIAGNOSIS — F331 Major depressive disorder, recurrent, moderate: Secondary | ICD-10-CM

## 2019-03-23 NOTE — Progress Notes (Signed)
Neuropsychological Evaluation   Patient:  [name]   DOB: [birth year]  MR Number: [id number]  Location: [facility]IC[facility] PHYSICAL MEDICINE AND REHABILITATION [place], [place] [id number] [place] [id number] Dept: [phone]  Start: 1 PM End: 2 PM  Provider/Observer:     Edgardo Roys PsyD  Chief Complaint:      Chief Complaint  Patient presents with  . Cerebrovascular Accident  . Depression  . Pain  . Sleeping Problem  . Memory Loss    Reason For Service:     [name] is a 49 year old male referred by Frann Rider, NP for neuropsychological consultation including evaluation and therapeutic interventions.  The patient has a history of aortic dissection s/p repair and reconstruction, systolic and diastolic heart failure, hypertension.  The patient had a major event on 02/28/2016 after developing confusion and right-sided weakness.  While in the emergency department he had a seizure and subsequent cardiac arrest.  The patient was intubated and started on Keppra.  Post extubation there were mild right hemiparesis and word finding difficulties.  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  The patient has not had continued seizures and the Keppra was discontinued in 2019.  The patient has also had a number of other medical issues.  They also found an aneurysm and needed to break 5 ribs during the surgery.  The patient has continued to have pain at the site of surgical interventions.  The patient also had a significant left foot injury in the past that is the primary cause of his disability prior and has had pins placed in his foot and continues with pain.  The patient also has a history of shoulder injury.  The patient has been dealing with symptoms of depression, difficulty with memory and cognition, difficulty times remembering where he is going when he is out driving, paracentesis for his right hand and right foot  particularly and disturbance of sleep.  The patient did participate in rehabilitative efforts after his stroke but continues to have significant paracentesis in his right hand and right foot particular.  The patient describes significant memory changes which make it hard when he goes places he forgets where he is supposed to be going.  The patient also describes difficulties with spelling and reading comprehension that are new since the stroke.  There is likely been involvement in his left PTO junction region and the patient describes difficulties with comprehension and putting "the pieces together."  Behavioral Observations: Appearance:Casually and appropriately dressed with good hygiene. Gait:Ambulated independently without assistance Speech:Clear, normal rate, normal tone & volume Thought process: Linear, disorganized, concrete.   Mood/Affect: Depressed, appropriate  Interpersonal: Polite and appropriate. Orientation: Oriented x 4 Effort/Motivation: Good   He did not appear to have difficulty seeing, hearing, or understanding test items/questions but did require additional prompting on most measures (e.g. repeated questions/instructions). He exhibited reduced distress tolerance (e.g. mildly frustrated) on questions he did not know or tasks that were more difficult.   Tests Administered:  Clock Drawing [employer] Adult Intelligence Scale, 4th Edition (WAIS-IV)  Wechsler Memory Scale, 4th edition, Adult Battery (WMS-IV-A)  Test Results:   Initially, an estimation was made as to premorbid/historical cognitive functioning.  Based on his education occupational history it is estimated that the patient was likely function in the average range overall and cognitive functioning with potential strengths in the areas of visual-spatial and information processing speed components.  Composite Score  Summary  Scale Sum of Scaled Scores Composite Score Percentile Rank 95% Conf. Interval  Qualitative Description  Verbal Comprehension 14 VCI 70 2 66-77 Borderline  Perceptual Reasoning 20 PRI 81 10 76-88 Low Average  Working Memory 11 WMI 74 4 69-82 Borderline  Processing Speed 13 PSI 81 10 75-91 Low Average  Full Scale 58 FSIQ 72 3 68-77 Borderline  General Ability 34 GAI 73 4 69-79 Borderline   The patient was administered the Wechsler Adult Intelligence Scale-IV to get objective measures with strong normative data sets and wide range of cognitive domain assessments.  The patient produced composite scores including a full-scale IQ score and general abilities index scores to be in the borderline range of functioning.  The patient's full-scale IQ score is currently a 72 which falls in the 3rd percentile and is in the borderline range.  We also calculated the general abilities index score which places less emphasis on acutely sensitive measures such as working memory and information processing speed.  The patient produced a general abilities index score of 73 which falls in the 4th percentile and is in the borderline range.  Both of these composite scores are significantly below predicted levels based on his educational and occupational history.  This pattern suggest that 1 or more cognitive domains are significantly affected impacted by his current or recent cerebrovascular events.   Verbal Comprehension Subtests Summary  Subtest Raw Score Scaled Score Percentile Rank Reference Group Scaled Score SEM  Similarities 18 6 9 6  1.04  Vocabulary 15 4 2 5  0.73  Information 5 4 2 5  0.73  (Comprehension) 17 6 9 7  1.16    The patient produced a verbal comprehension index score of 70 which falls at the 2nd percentile and is in the borderline range of overall functioning.  There was little significant scatter in subtest performance with all performing in the mild to moderately impaired range.  The patient had particular difficulties recalling longstanding vocabulary knowledge and general fund of  information types of knowledge.  The patient showed deficits with regard to verbal reasoning and problem-solving as well as social judgment and comprehension tasks.   Perceptual Reasoning Subtests Summary  Subtest Raw Score Scaled Score Percentile Rank Reference Group Scaled Score SEM  Block Design 24 6 9 6  0.95  Matrix Reasoning 12 7 16 6  0.95  Visual Puzzles 10 7 16 7  0.85  (Figure Weights) 7 6 9 5  0.90  (Picture Completion) 9 7 16 7  1.24   The patient produced a perceptual reasoning index score of 81 which falls at the 10th percentile and is in the low average range.  Overall, he did somewhat better on measures of visual-spatial abilities but they continue to show some deficits.  The patient showed mild to moderate deficits with regard to visual analysis and organization, visual reasoning and problem solving, visual judgment estimation as well as his ability to identify visual anomalies within a visual gestalt.  Given the patient's occupational history is likely that there are deficits in his cognitive domain from previous functioning.   Clock Drawing Test   WNL   The patient was also administered the clock drawing test to look at visual constructional abilities.  The patient's performance on the clock drawing test was within normal limits and did not show any deficits with regard to visual constructional abilities.   Working Doctor, general practice Raw Score Scaled Score Percentile Rank Reference Group Scaled Score SEM  Digit Span 19 6 9 5  0.73  Arithmetic 8 5 5 5  0.90  (Letter-Number Seq.) 17 8 25 8  1.04    The patient produced a working memory index score of 74 which falls at the 4th percentile and is also in the borderline range.  The patient showed deficits with regard to auditory encoding and processing of information but most dramatically greater difficulty with arithmetic and mathematical equation is being performed on this encoding information.  This pattern is likely  resulting from left parietal lobe impacts as well as attention and concentration/encoding deficits.   Processing Speed Subtests Summary  Subtest Raw Score Scaled Score Percentile Rank Reference Group Scaled Score SEM  Symbol Search 21 6 9 6  1.56  Coding 48 7 16 6  1.20  (Cancellation) 25 6 9 5  1.62    The patient produced a processing speed index score of 81 which falls at the 10th percentile and is in the low average range.  The patient showed deficits with regard to visual scanning, visual searching and overall speed of mental operations with deficits in focus execute task.  Index Score Summary  Index Sum of Scaled Scores Index Score Percentile Rank 95% Confidence Interval Qualitative Descriptor  Auditory Memory (AMI) 19 69 2 64-77 Extremely Low  Visual Memory (VMI) 29 84 14 79-90 Low Average  Visual Working Memory (VWMI) 16 88 21 82-96 Low Average  Immediate Memory (IMI) 19 65 1 61-73 Extremely Low  Delayed Memory (DMI) 29 81 10 75-89 Low Average   The patient was also administered the Wechsler Memory Scale-IV.  Initially, the patient was assessed for visual working memory/visual encoding abilities.  The patient performed at the produced an index score of 88 on measures of visual working memory which while it is mildly impaired relative to predicted levels it is significantly better than his performance on measures of auditory encoding.  The patient produced an auditory memory index score of 69 which falls at the 2nd percentile and is in the extremely low range of functioning relative to a normative comparison group.  This pattern showed significant auditory memory deficits both for encoding, immediate/short-term memory as well as delayed memory recall and retrieval.  While the patient's visual memory was significantly above auditory memory he did show deficits relative to premorbid estimation and prediction of where he should be functioning for visual memory.  The patient produced a  visual memory index score of 84 which falls at the 14th percentile and is in the low average range.  The patient's immediate memory index score of 65 falls at the 1st percentile and is in the extremely low range.  This is even greater than measures of deficits for both auditory and visual encoding components and therefore indicate some significant difficulties with initially stored and organizing information beyond those that would be explained by encoding deficits only.  In contrast, the patient's delayed memory index score, while mildly impaired was significantly above his immediate memory index score.  This strongly suggest that while the patient had difficulty encoding and storing information once it was stored it was available for later retrieval.  This pattern would be consistent with some subcortical involvement in his deficits particularly hippocampal functioning but once information is able to be transferred to temporal lobe stores it is available for later retrieval.  ABILITY-MEMORY ANALYSIS  Ability Score:  GAI: 73 Date of Testing:  WAIS-IV; WMS-IV 2019/03/20  Predicted Difference Method   Index Predicted WMS-IV Index Score Actual WMS-IV Index Score Difference Critical Value  Significant Difference Y/N Base Rate  Auditory Memory 86 69 17 9.38 Y 10%  Visual Memory 84 84 0 9.36 N   Visual Working Memory 82 88 -6 11.05 N   Immediate Memory 82 65 17 10.29 Y 5-10%  Delayed Memory 84 81 3 10.13 N   Statistical significance (critical value) at the .01 level.    To further analyze the patient's memory functions relative to his current global cognitive functioning we calculated his ability-memory analysis scores.  In this measure rather than using estimations based on premorbid/historical data we utilized the patient's current general abilities index score which was impaired and indicative of a loss but we use this measure to estimate where his actual memory functions should be to get a  greater idea about the specific areas of deficits.  Utilizing this method the patient showed significant deficits with regard to both auditory memory and immediate memory functions.  These are clearly the areas that he is having the most difficulty in and are likely even playing a role in some of the other memory functions as auditory memory issues to understand instructions and self regulate during other memory tasks.  Summary of Results:   Overall, the results of the current objective neuropsychological evaluation do show indications of significant global reduction in overall cognitive functioning.  The patient's overall performance was significantly below predictions based on his educational, occupational and psychosocial variables.  In particular, the patient shows significant deficits with regard to his verbal comprehension and verbal reasoning measures as well as a loss of long-term factual and historical knowledge.  While the patient showed some weaknesses with regard to perceptual reasoning and processing and overall information processing speed these were his relatively higher areas of function.  The patient showed deficits with regard to both auditory and visual encoding abilities but auditory encoding displayed much greater impairment relative to visual encoding.  The patient showed significant deficits with regard to basic arithmetic and mathematical equations, visual reasoning and problem-solving and overall speed of mental operations.  As far as memory functions the patient showed much greater memory deficits for auditory memory versus visual memory although visual memory was below predicted levels.  The patient showed severe deficits with regard to auditory encoding, auditory storage and organization.  However, if information was encoded and organized/stored it did appear to be available for recall after period of delay.  Impression/Diagnosis:   Overall, the results of the current  neuropsychological evaluation are very consistent with cognitive deficits associated with a specific region and location of his cerebrovascular accident that occurred on 02/28/2016 related to a left MCI embolic stroke.  The majority of his deficits are primarily focused on areas that are mediated by these brain regions including language-based skills, auditory learning and memory, mathematical abilities and visual analysis and organizational abilities.  There appear to be some involvement in left frontal but to a greater degree left parietal brain regions with some likely hippocampal involvement in deficits.  The patient's left temporal lobe appears to be generally well preserved and there much less deficits associated with right hemisphere brain functioning.  There do not appear to be any progressive deterioration overall functioning.  As far as recommendations, the patient is likely outside timelines for significant improvement in recovery and various cognitive deficits that were impacted by his 2017 CVA.  However, there are specific coping strategies and adjustment strategies available.  I will be meeting with the patient early in March to go over these results and we have already set up the patient for  therapeutic interventions both around coping skills and strategies for his residual cognitive and neuropsychological deficits as well as working on significant coping mechanisms around his residual depression and the impact his functional loss and cognitive functioning and motor functioning or having in his overall life.  I will be following the patient up and working on various coping strategies to deal with these residual deficits.  Diagnosis:    Axis I: Cerebral infarction due to embolism of left middle cerebral artery ([place])  Late effects of cerebrovascular accident  Moderate episode of recurrent major depressive disorder ([place])   Ilean Skill, Psy.D. Neuropsychologist

## 2019-03-27 ENCOUNTER — Ambulatory Visit: Payer: Medicare HMO | Admitting: Internal Medicine

## 2019-04-03 ENCOUNTER — Telehealth: Payer: Self-pay | Admitting: Internal Medicine

## 2019-04-03 NOTE — Telephone Encounter (Signed)
LMTCB - need to know if he is taking repatha, if so.. he needs labs. PA request pending in [facility]

## 2019-04-09 IMAGING — DX DG CHEST 2V
2 series · 2 of 2 positions shown · non-contrast
Comparison: 03/01/2016

CLINICAL DATA: Left-sided chest pain and dyspnea x1 week

EXAM:
CHEST  2 VIEW

[chest pa]
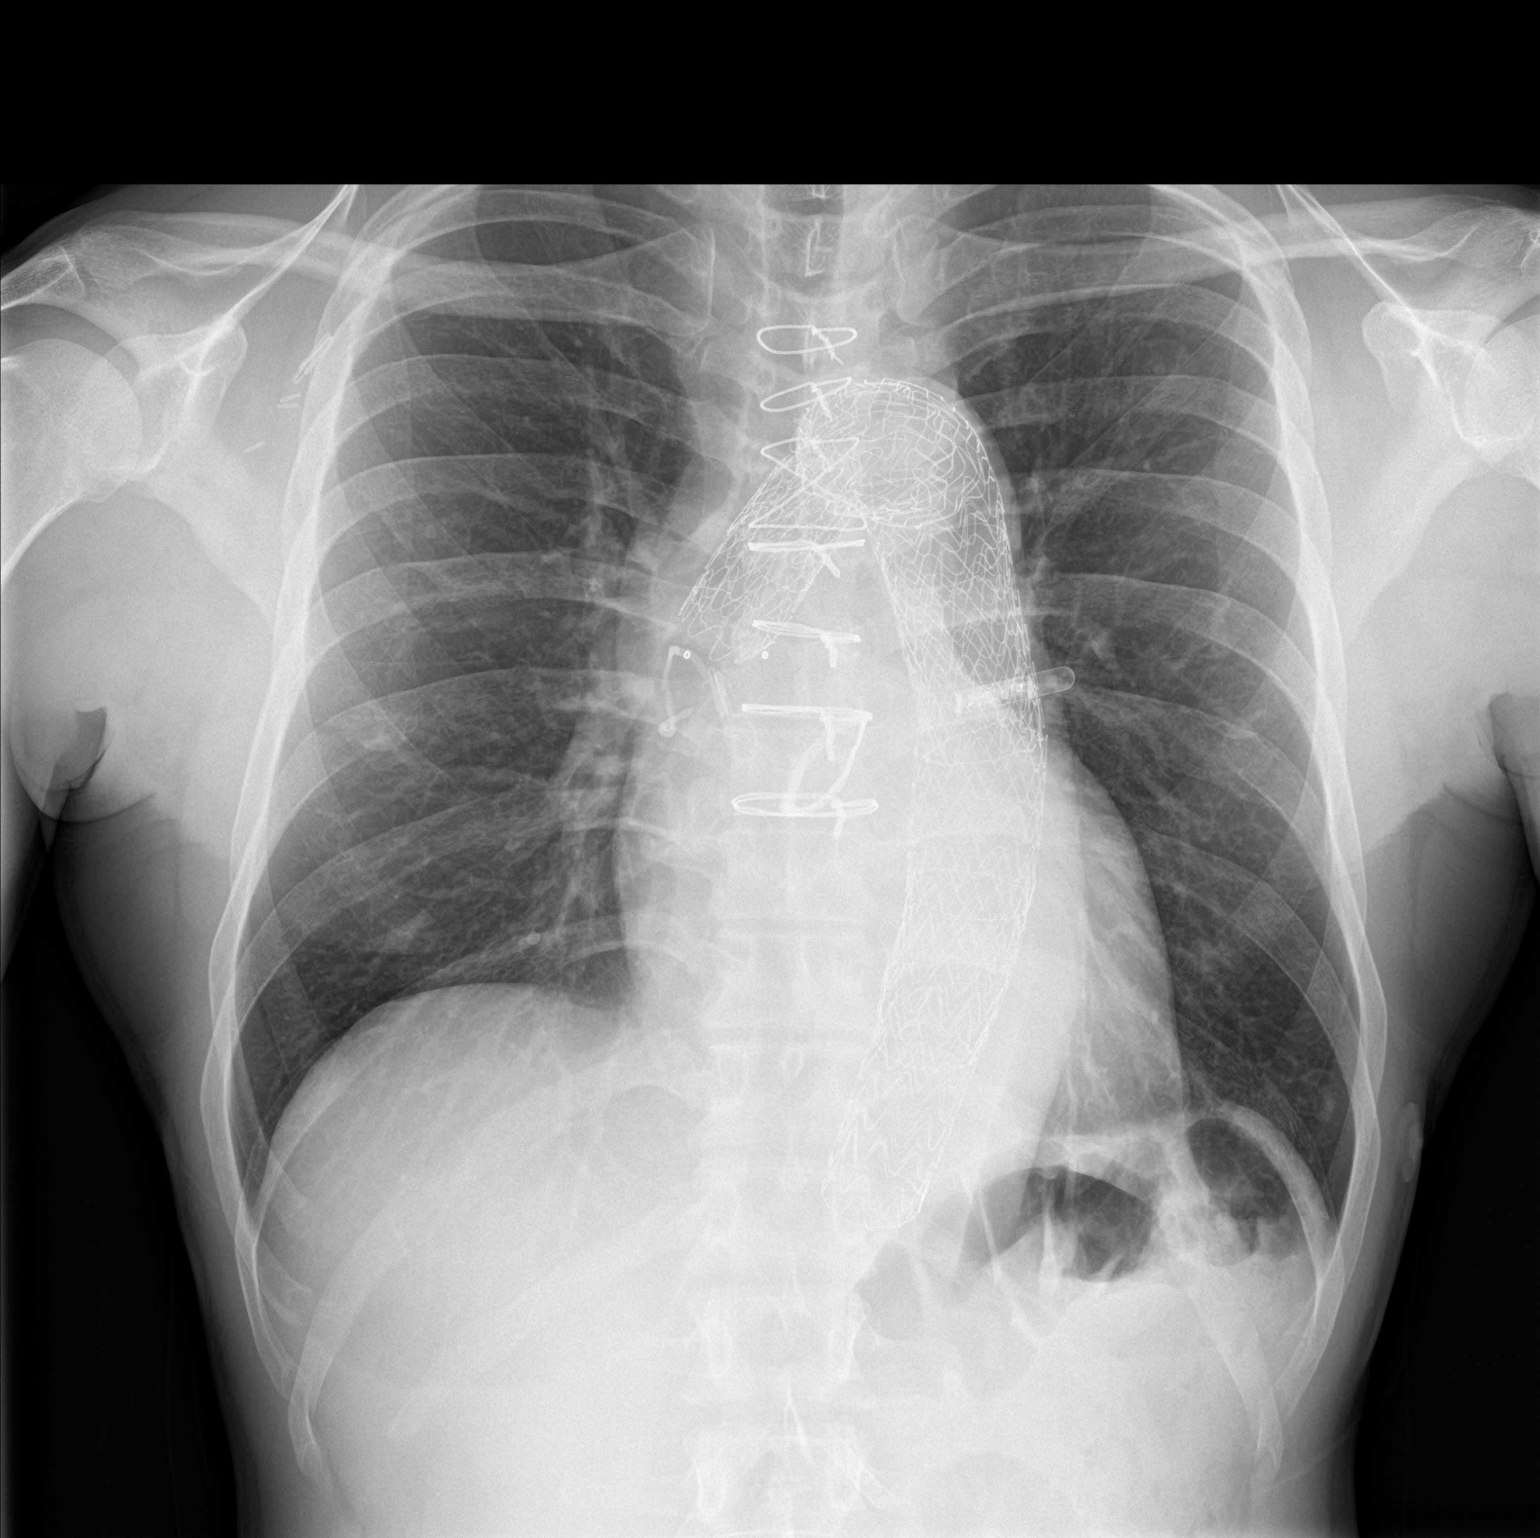

[chest lat]
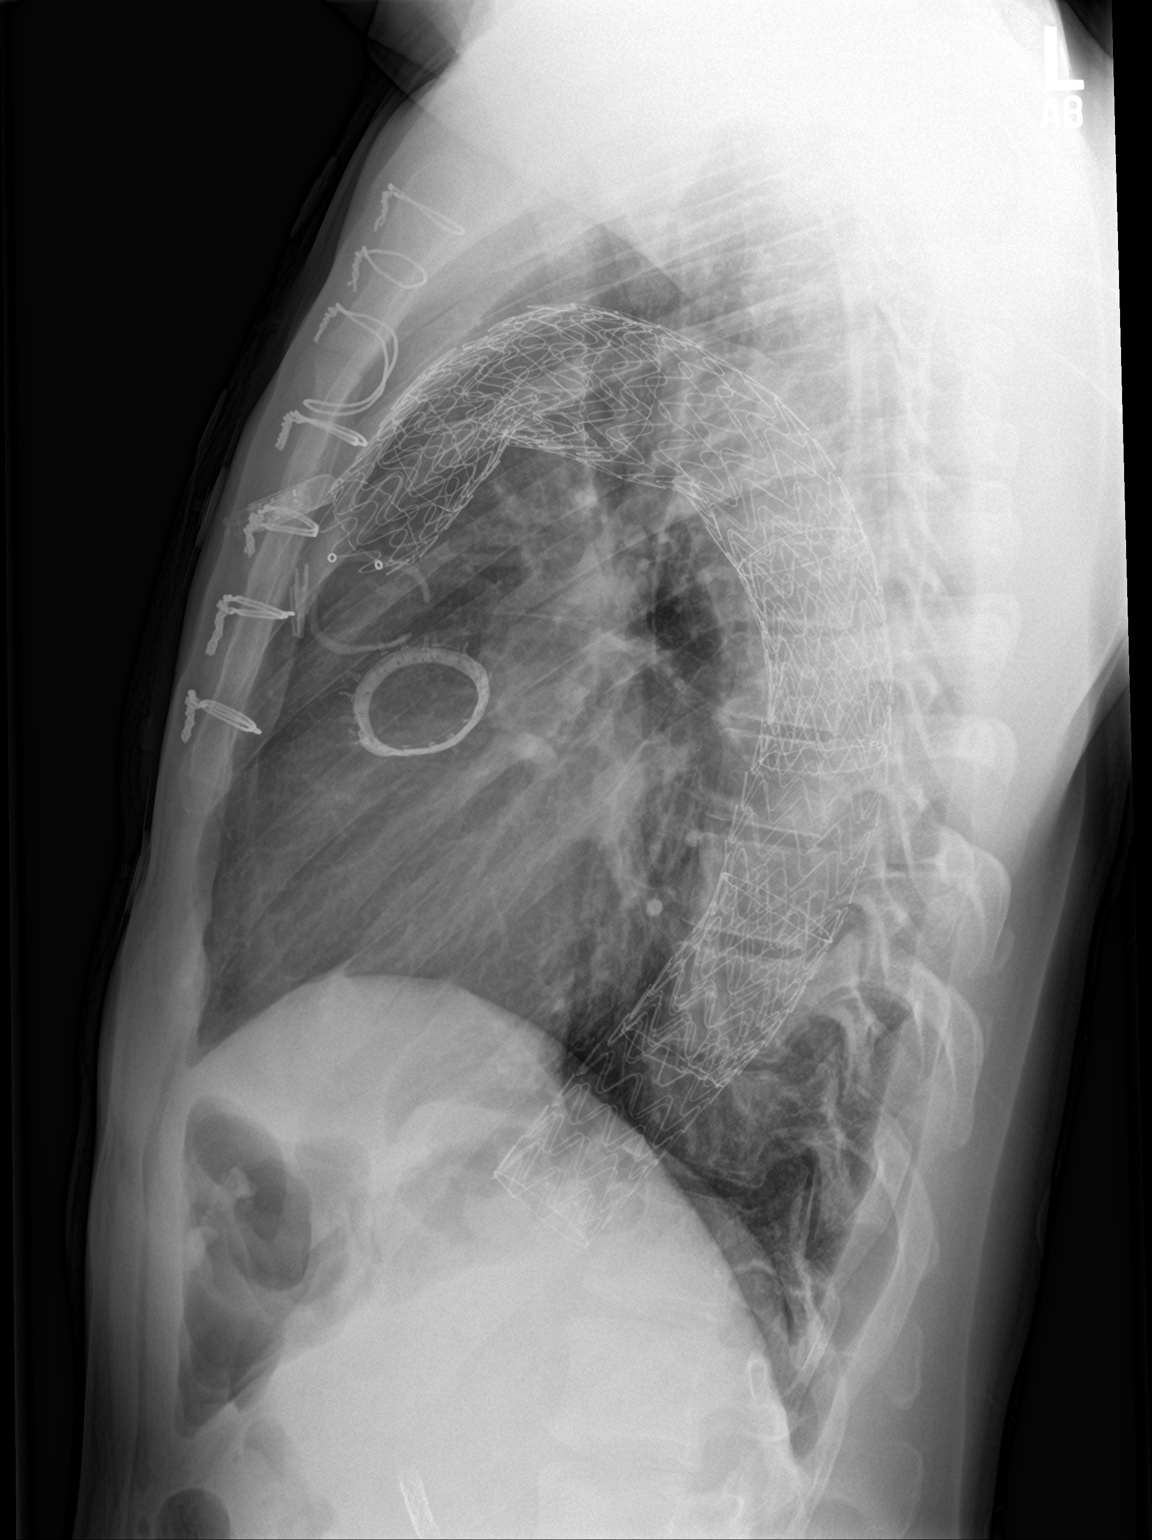

[2 of 2 positions shown; findings below may reference images not displayed]

FINDINGS: Heart size is top-normal. Median sternotomy sutures, prosthetic
aortic valve and aortic stent graft are again visualized without
significant interval change. Lungs are free of pneumonic
consolidation, CHF, effusion or pneumothorax. No acute osseous
abnormality. Surgical clips project over the right shoulder.
IMPRESSION: No active cardiopulmonary disease.

## 2019-04-09 IMAGING — CT CT ANGIO CHEST-ABD-PELV FOR DISSECTION W/ AND WO/W CM
2 of 10 series · 10 of 36 positions shown, 15 images · IV contrast (isovue)
Comparison: Chest radiographs obtained earlier today and previous
chest, abdomen and pelvis CT examinations.

CLINICAL DATA: Left chest and abdomen pain, worsening over the past
3 days. Aortic stent graft placed 3 years ago.

EXAM:
CT ANGIOGRAPHY CHEST, ABDOMEN AND PELVIS
TECHNIQUE: Multidetector CT imaging through the chest, abdomen and pelvis was
performed using the standard protocol during bolus administration of
intravenous contrast. Multiplanar reconstructed images and MIPs were
obtained and reviewed to evaluate the vascular anatomy.
CONTRAST:  100 cc Isovue 370

[Series 8: dissection 3.0 i30f 3 · axial · 0.76mm/px · z∈[+905,+1466]mm · 9 of 222 slices shown, 13 images]
[im 18/222  mediastinal]
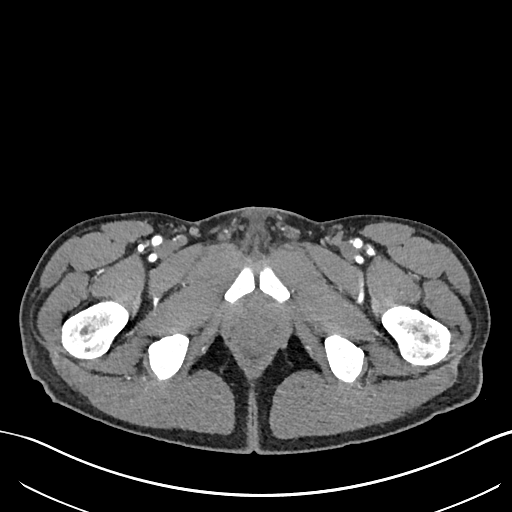
[im 18/222  bone]
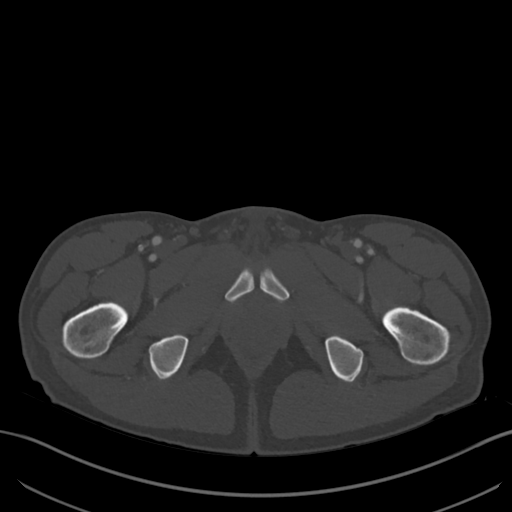
[im 52/222  mediastinal]
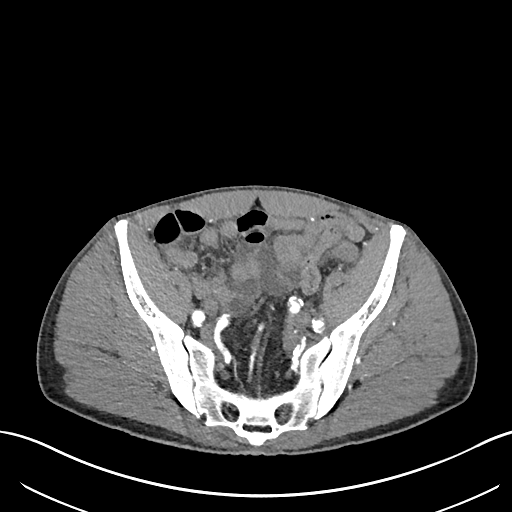
[im 69/222  mediastinal]
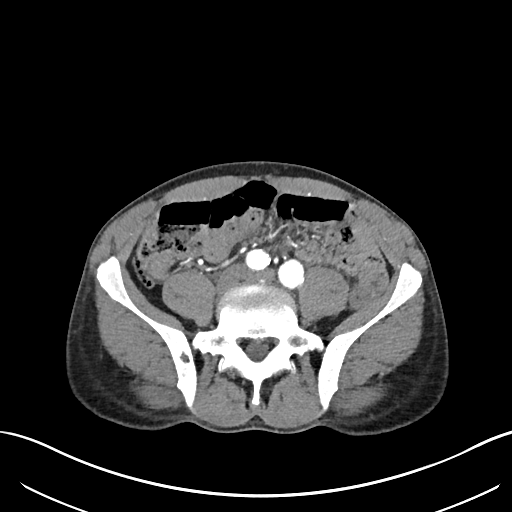
[im 103/222  mediastinal]
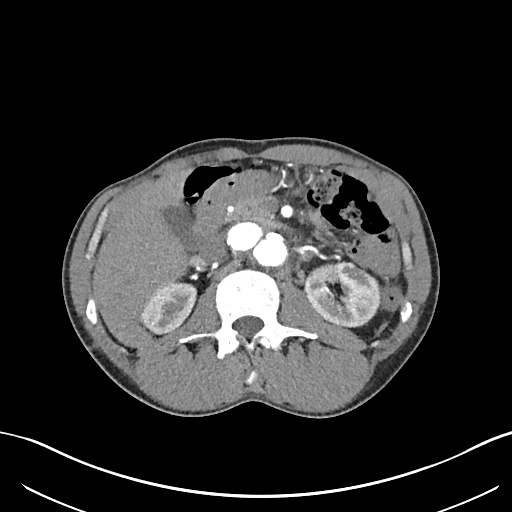
[im 120/222  mediastinal]
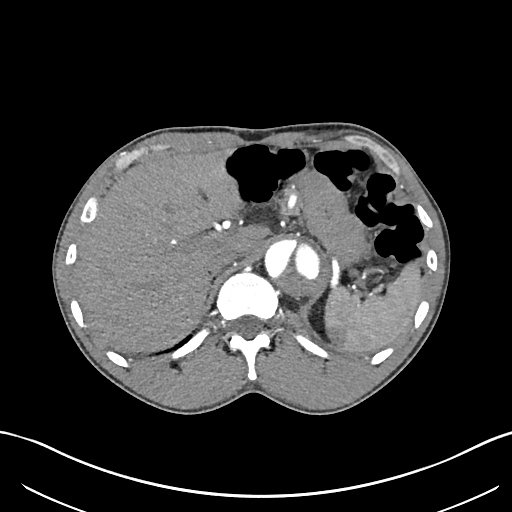
[im 154/222  mediastinal]
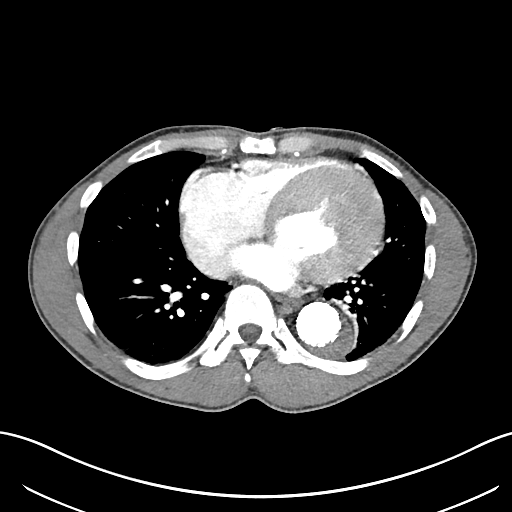
[im 154/222  lung]
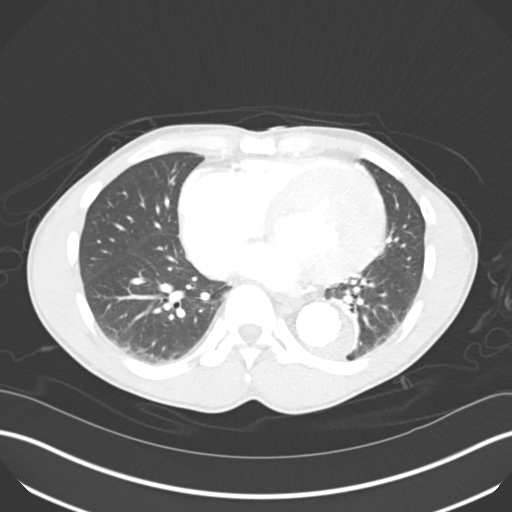
[im 171/222  mediastinal]
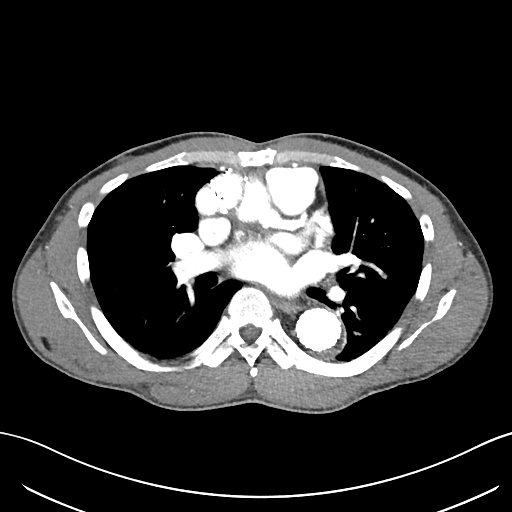
[im 171/222  lung]
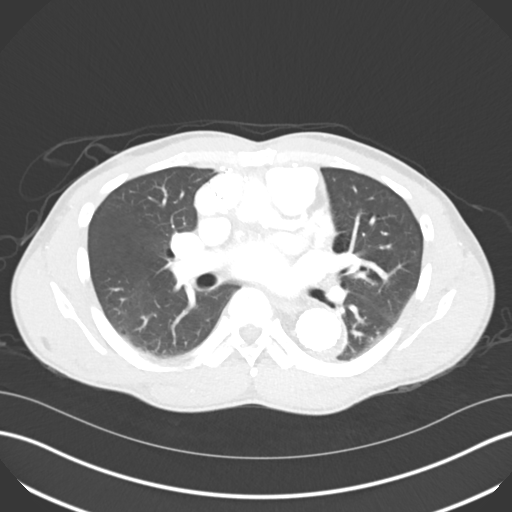
[im 188/222  lung]
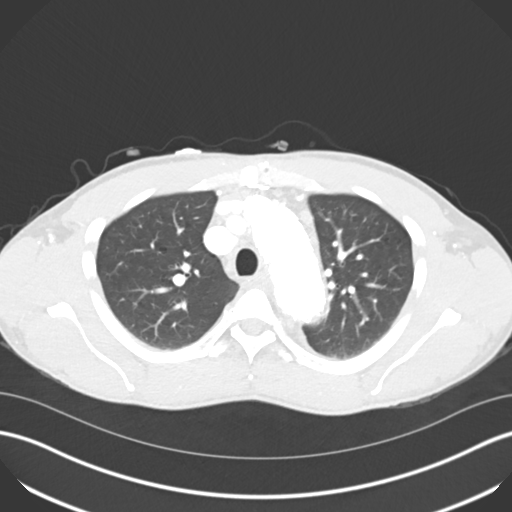
[im 205/222  mediastinal]
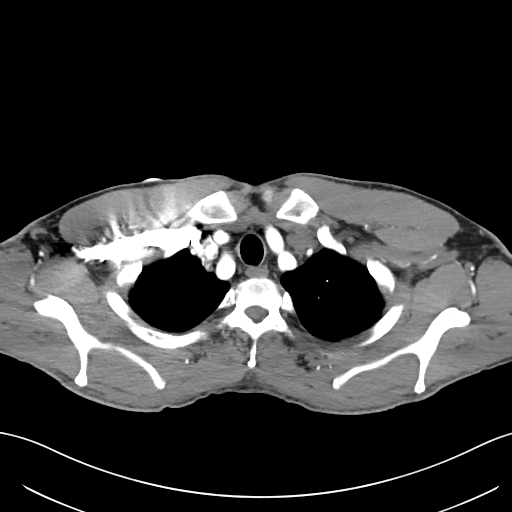
[im 205/222  lung]
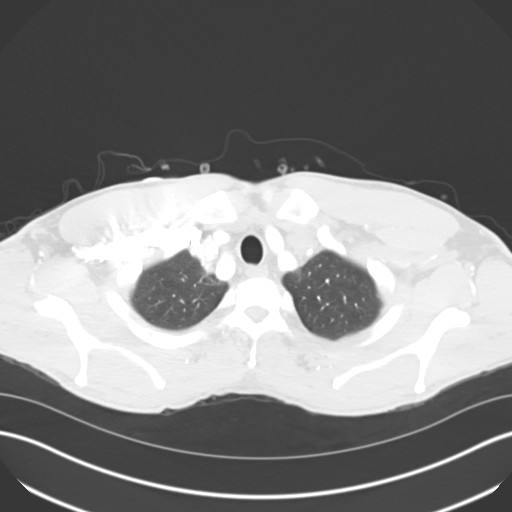

[Series 11: coronals · coronal · 0.97mm/px · 1 of 127 slices shown, 2 images]
[im 64/127  mediastinal]
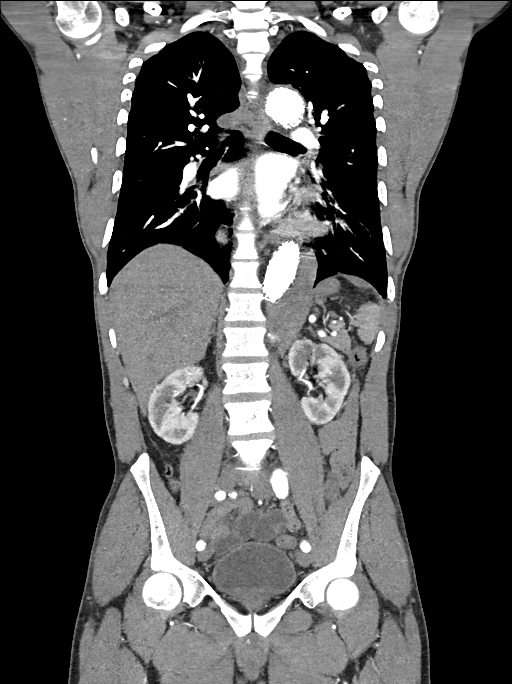
[im 64/127  bone]
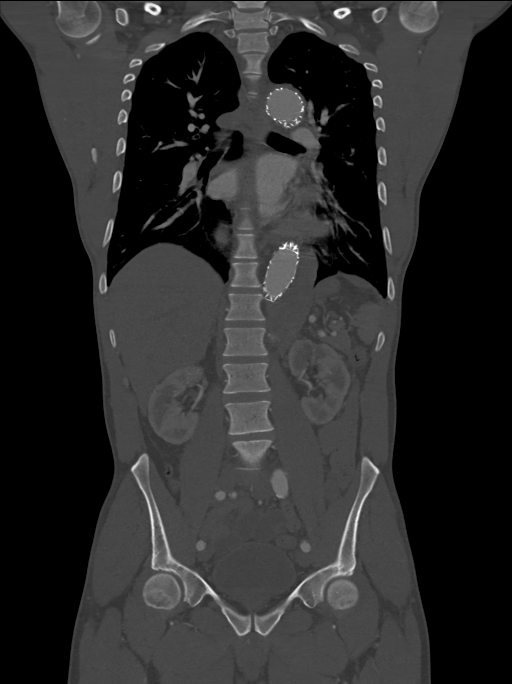

[10 of 36 positions shown; findings below may reference images not displayed]

FINDINGS: CTA CHEST FINDINGS

Cardiovascular: Prosthetic aortic valve, reimplanted brachiocephalic
vessels arising from the proximal aorta and stent graft in the
remainder of the thoracic aorta without significant change since
10/13/2015. Stable thrombosis in the descending thoracic aorta false
lumen with no contrast seen in the thrombosed false lumen distally
on today's examination.

The heart remains borderline enlarged.

Mediastinum/Nodes: Stable mildly prominent mediastinal nodes with no
abnormally enlarged nodes seen. Normal appearing thyroid gland.

Lungs/Pleura: Mild right lower lobe bullous change. Mild left upper
lobe bullous change. No lung nodules or pleural fluid. Mildly
improved atelectasis adjacent to the false lumen of the descending
thoracic aorta.

Musculoskeletal: Minimal thoracic spine degenerative changes.

Review of the MIP images confirms the above findings.

CTA ABDOMEN AND PELVIS FINDINGS

VASCULAR

Aorta: Again demonstrated is dissection involving the proximal
abdominal aorta from just below the diaphragmatic hiatus to below
the origin of the left renal artery. There has been interval partial
thrombosis of the false lumen with some persistent opacification of
the false lumen.

Celiac: The celiac axis again arises from the opacified false lumen
and appears normal.

SMA: Arises from the true lumen and appears normal.

Renals: Arise from the true lumen, normal appearing on the right in
with a proximal stent on the left.

IMA: Arises from the true lumen with a focal high-grade proximal
stenosis best seen on sagittal image number 88 of series 14. This is
also seen on axial image number 138 of series 8.

Inflow: Bilateral by external and internal iliac artery plaque
formation without significant luminal narrowing.

Veins: No obvious venous abnormality within the limitations of this
arterial phase study.

Review of the MIP images confirms the above findings.

NON-VASCULAR

Hepatobiliary: No focal liver abnormality is seen. No gallstones,
gallbladder wall thickening, or biliary dilatation.

Pancreas: Unremarkable. No pancreatic ductal dilatation or
surrounding inflammatory changes.

Spleen: Normal in size without focal abnormality.

Adrenals/Urinary Tract: Adrenal glands are unremarkable. Kidneys are
normal, without renal calculi, focal lesion, or hydronephrosis.
Bladder is unremarkable.

Stomach/Bowel: Multiple small sigmoid colon diverticulum. No gastric
or small bowel abnormalities. Normal appearing appendix.

Lymphatic: No enlarged lymph nodes.

Reproductive: Moderately enlarged prostate gland with some coarse
central calcification.

Other: None.

Musculoskeletal: Minimal lumbar spine degenerative changes.

Review of the MIP images confirms the above findings.
IMPRESSION: 1. No acute abnormality.
2. Interval complete thrombosis of the false lumen of the thoracic
portion of the thoracic aortic dissection with previous stent graft
placement.
3. Interval partial thrombosis of the false lumen of the proximal
abdominal aortic component of the aortic dissection with continued
normal opacification of the celiac axis from the false lumen.
4. High-grade stenosis of the proximal inferior mesenteric artery.
5. Sigmoid diverticulosis.
6. Moderately enlarged prostate gland.
7. Mild changes of COPD.

## 2019-04-27 ENCOUNTER — Ambulatory Visit: Payer: Medicare HMO | Admitting: Psychology

## 2019-05-04 ENCOUNTER — Encounter: Payer: Medicare HMO | Attending: Psychology | Admitting: Psychology

## 2019-05-04 ENCOUNTER — Other Ambulatory Visit: Payer: Self-pay

## 2019-05-04 DIAGNOSIS — F331 Major depressive disorder, recurrent, moderate: Secondary | ICD-10-CM | POA: Insufficient documentation

## 2019-05-04 DIAGNOSIS — I63412 Cerebral infarction due to embolism of left middle cerebral artery: Secondary | ICD-10-CM | POA: Insufficient documentation

## 2019-05-04 DIAGNOSIS — I6990 Unspecified sequelae of unspecified cerebrovascular disease: Secondary | ICD-10-CM | POA: Insufficient documentation

## 2019-05-14 ENCOUNTER — Other Ambulatory Visit (HOSPITAL_COMMUNITY): Payer: Self-pay | Admitting: Adult Health

## 2019-05-15 ENCOUNTER — Other Ambulatory Visit (HOSPITAL_COMMUNITY): Payer: Self-pay

## 2019-05-15 MED ORDER — AMLODIPINE BESYLATE 10 MG PO TABS: 10.0000 mg | ORAL_TABLET | Freq: Every day | ORAL | 4 refills | Status: DC

## 2019-05-23 NOTE — Telephone Encounter (Signed)
Patient has VV with MD on 05/25/19 Scheduler/operator was notified to advise patient to have labs prior to this appointment

## 2019-05-24 LAB — LIPID PANEL
Chol/HDL Ratio: 4.2 ratio (ref 0.0–5.0)
Cholesterol, Total: 178 mg/dL (ref 100–199)
HDL: 42 mg/dL (ref >39)
LDL Chol Calc (NIH): 107 mg/dL — ABNORMAL HIGH (ref 0–99)
Triglycerides: 165 mg/dL — ABNORMAL HIGH (ref 0–149)
VLDL Cholesterol Cal: 29 mg/dL (ref 5–40)

## 2019-05-25 ENCOUNTER — Encounter: Payer: Self-pay | Admitting: Internal Medicine

## 2019-05-25 ENCOUNTER — Telehealth: Payer: Self-pay | Admitting: Internal Medicine

## 2019-05-25 ENCOUNTER — Telehealth: Payer: Medicare HMO | Admitting: Physician Assistant

## 2019-05-25 ENCOUNTER — Telehealth (INDEPENDENT_AMBULATORY_CARE_PROVIDER_SITE_OTHER): Payer: Medicare HMO | Admitting: Internal Medicine

## 2019-05-25 VITALS — BP 143/87 | Wt 163.0 lb

## 2019-05-25 DIAGNOSIS — E785 Hyperlipidemia, unspecified: Secondary | ICD-10-CM | POA: Diagnosis not present

## 2019-05-25 DIAGNOSIS — I25119 Atherosclerotic heart disease of native coronary artery with unspecified angina pectoris: Secondary | ICD-10-CM

## 2019-05-25 DIAGNOSIS — I5022 Chronic systolic (congestive) heart failure: Secondary | ICD-10-CM

## 2019-05-25 MED ORDER — REPATHA SURECLICK 140 MG/ML ~~LOC~~ SOAJ: 1.0000 | SUBCUTANEOUS | 11 refills | Status: DC

## 2019-05-25 NOTE — Patient Instructions (Signed)
Medication Instructions:  We will work on renewing your Repatha prescription Please resume this and continue other current medications  *If you need a refill on your cardiac medications before your next appointment, please call your pharmacy*   Lab Work: FASTING lab work in 4 months to check cholesterol   If you have labs (blood work) drawn today and your tests are completely normal, you will receive your results only by: [name] MyChart Message (if you have MyChart) OR . A paper copy in the mail If you have any lab test that is abnormal or we need to change your treatment, we will call you to review the results.   Testing/Procedures: NONE   Follow-Up: At [facility], you and your health needs are our priority.  As part of our continuing mission to provide you with exceptional heart care, we have created designated Provider Care Teams.  These Care Teams include your primary Cardiologist (physician) and Advanced Practice Providers (APPs -  Physician Assistants and Nurse Practitioners) who all work together to provide you with the care you need, when you need it.  We recommend signing up for the patient portal called "MyChart".  Sign up information is provided on this After Visit Summary.  MyChart is used to connect with patients for Virtual Visits (Telemedicine).  Patients are able to view lab/test results, encounter notes, upcoming appointments, etc.  Non-urgent messages can be sent to your provider as well.   To learn more about what you can do with MyChart, go to [email or web address].    Your next appointment:   4 month(s)  The format for your next appointment:   Either In Person or Virtual  Provider:   Raliegh Ip Mali Hilty, MD - lipid clinic   Other Instructions

## 2019-05-25 NOTE — Telephone Encounter (Signed)
Attempted prior auth for repatha on [facility] Message received was that authorization is already on file
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
## 2019-05-25 NOTE — Addendum Note (Signed)
Addended by: Fidel Levy on: 05/25/2019 08:48 AM   Modules accepted: Orders

## 2019-06-01 ENCOUNTER — Encounter: Payer: Medicare HMO | Attending: Psychology | Admitting: Psychology

## 2019-06-01 DIAGNOSIS — I6990 Unspecified sequelae of unspecified cerebrovascular disease: Secondary | ICD-10-CM | POA: Insufficient documentation

## 2019-06-01 DIAGNOSIS — F331 Major depressive disorder, recurrent, moderate: Secondary | ICD-10-CM | POA: Insufficient documentation

## 2019-06-01 DIAGNOSIS — I63412 Cerebral infarction due to embolism of left middle cerebral artery: Secondary | ICD-10-CM | POA: Insufficient documentation

## 2019-06-15 ENCOUNTER — Encounter: Payer: Medicare HMO | Attending: Psychology | Admitting: Psychology

## 2019-06-15 DIAGNOSIS — I6990 Unspecified sequelae of unspecified cerebrovascular disease: Secondary | ICD-10-CM | POA: Insufficient documentation

## 2019-06-15 DIAGNOSIS — F331 Major depressive disorder, recurrent, moderate: Secondary | ICD-10-CM | POA: Insufficient documentation

## 2019-06-15 DIAGNOSIS — I63412 Cerebral infarction due to embolism of left middle cerebral artery: Secondary | ICD-10-CM | POA: Insufficient documentation

## 2019-06-19 ENCOUNTER — Other Ambulatory Visit: Payer: Self-pay | Admitting: Gastroenterology

## 2019-06-29 ENCOUNTER — Encounter: Payer: Medicare HMO | Admitting: Psychology

## 2019-07-10 ENCOUNTER — Other Ambulatory Visit: Payer: Self-pay | Admitting: Adult Health

## 2019-07-10 DIAGNOSIS — R202 Paresthesia of skin: Secondary | ICD-10-CM

## 2019-07-10 DIAGNOSIS — G4452 New daily persistent headache (NDPH): Secondary | ICD-10-CM

## 2019-07-11 ENCOUNTER — Ambulatory Visit: Payer: Medicare HMO | Admitting: Psychology

## 2019-08-01 ENCOUNTER — Encounter: Payer: Self-pay | Admitting: Adult Health

## 2019-08-01 ENCOUNTER — Telehealth: Payer: Self-pay | Admitting: *Deleted

## 2019-08-01 ENCOUNTER — Ambulatory Visit: Payer: Medicare HMO | Admitting: Adult Health

## 2019-08-01 NOTE — Telephone Encounter (Signed)
Patient was no show for follow up with NP today.  

## 2019-08-01 NOTE — Progress Notes (Deleted)
[facility]  PATIENT: [name] DOB: 1970-06-09   REASON FOR VISIT: Follow-up for stroke 02/2016 HISTORY FROM: Patient    Chief complaint: No chief complaint on file.    HPI  Today, 08/01/2019, [name] returns for follow-up with stroke history, seizures post stroke and occipital headaches.  Residual deficits of right hemisensory impairment with paresthesias and cognitive impairment have been stable.  Denies new or worsening stroke/TIA symptoms.  Underwent neurocognitive testing with Dr. Sima Matas and was found to have significant global reduction in overall cognitive functioning consistent with specific region and location of prior stroke.  Not currently on AED for seizure prophylaxis without recurrent seizure activity since 2019.  Prior complaints of occipital headache with ongoing use of amitriptyline 50 mg nightly which was increased at prior visit with benefit in regards to headaches.  Continues on aspirin 81 mg daily, Zetia, Crestor 40 mg daily and Repatha for secondary stroke prevention.  Blood pressure today ***.  Loop recorder is not shown atrial fibrillation thus far.  Ongoing routine follow-up with PCP, cardiology and cardiothoracic surgery.  No concerns at this time.       History provided for reference purposes only Update 02/01/2019 JM: [name] is a 49 year old male who is being seen today for stroke follow-up.  Residual deficits right hemisensory deficits with paresthesias, subjective memory complaints and imbalance.  He was referred to neuropsychology at prior visit and does have initial evaluation on 02/23/2019.  Previously working with therapy with ongoing improvement.  Initiated amitriptyline at prior visit due to possible occipital headaches.  Possibly related to occipital neuralgia with evidence of cyst potentially pressing on occipital nerve.  He was referred to dermatology for further evaluation and potential removal of cyst but has not  been evaluated at this time.  Headaches have greatly improved with use of amitriptyline 25 mg nightly.  He does have ongoing complaints of left side pain which has been ongoing since surgical procedure for renal aneurysm.  He states this greatly limits daily activity due to severe pain with certain positions consisting of sharp stabbing pain as well as burning type sensation.  Previously on amitriptyline and gabapentin without benefit.  He also endorses sensation of rib movement or catching sensation with specific movements.  He was advised by his cardiothoracic surgeon to obtain x-rays but has had difficulty due to COVID-19 restrictions.  He plans on following up with him soon regarding obtaining imaging.  He continues to be treated actively for ongoing depression by behavioral health.  Continues on aspirin and Plavix without bleeding or bruising.  Continues on Crestor and Zetia for HLD management. He is also prescribed Repatha but due to prescription issues, he has not had injections in the past 2 months.  He did not have follow-up with lipid clinic as recommended. Blood pressure today 136/82. Loop recorder has not shown atrial fibrillation thus far.  No further concerns at this time.  10/25/2018 update JM: [name] is a 49 year old male who is being seen today for follow-up regarding stroke in 02/2016.  Residual stroke deficits of right-sided paresthesias with decreased sensory and subjective weakness.  The symptoms have been stable without any worsening but does feel as though memory/cognition has been worsening since his stroke.  He has difficulty remembering specific dates, conversations that recently took place and will have difficulty at times remembering where he is out while driving despite being in no locations.  He also endorses delayed recall.  Paresthesias are worse right lower extremity  distally and are typically worse at night which has been interfering with his sleep.  He has trialed gabapentin  in the past without benefit.  He also endorses headaches located left-sided head along occipital nerve branch with an electrical shock type of sensation.  He has trialed Tylenol in the past with mild benefit.  Since prior visit,he has been hospitalized x2 with most recent 09/2018 with NSTEMI and continues to follow with cardiology regularly.  He continues on aspirin and clopidogrel 75 mg for secondary stroke prevention and underlying cardiac history without bleeding or bruising.  He was referred to lipid clinic for ongoing difficulty treating LDL despite max tolerated medical therapy.  Recommend initiating Lucien and repeat labs in 3 to 4 months.  Blood pressure elevated at 180/98 which typically is elevated at visit but also endorses ongoing difficulty with management.  He denies any recurrent seizure activity and remains off Keppra.  Loop recorder is not shown atrial fibrillation thus far.  No further concerns at this time.  History summary Andre J [name] a 48 y.o.malewith history of aortic dissection s/p repair and reconstruction, systolic and diastolic heart failure, and HTN admitted on 02/28/16 for confusion and right-sided weakness. In ED, had seizure and subsequent cardiac arrest. CT negative for bleeding. CTA without clear LVO, but moderate narrowing of left M2 and right P2. Hewas not considered for IV TPA due to seizure at onset with non-stroke presentation. Intubated and started on Keppra. Post extubation had mild right hemiparesis and word finding difficulty. MRI showed left MCA embolic stroke. EF 55-60%. Negative DVT. LDL 124 and A1C 5.3. Cardiology recommended outpt TEE and 30 day cardiac event monitoring. If monitoring negative, will consider loop recorder. He was discharged with ASA and lipitor without neuro deficit. Underwent TEE on 03/12/2016 which was unremarkable therefore loop recorder placed to assess for atrial fibrillation. Underwent aortic aneurysm repair in 48/5462 without  complications Underwent sleep study in 05/2017 which did not show evidence of sleep apnea Discontinue Keppra mid 2019 due to absence of seizures       REVIEW OF SYSTEMS: Full 14 system review of systems performed and notable only for those listed, all others are neg:  Constitutional: neg  Cardiovascular: neg Ear/Nose/Throat: Headache Skin: Numbness/tingling Eyes: neg Respiratory: neg Gastroitestinal: neg  Hematology/Lymphatic: neg  Endocrine: neg Musculoskeletal:neg Allergy/Immunology: neg Neurological: Memory loss Psychiatric: Depression  Sleep : Insomnia  ALLERGIES: Allergies  Allergen Reactions  . Imdur [Isosorbide Nitrate] Other (See Comments)    Headaches   . Tramadol Other (See Comments)    Headaches     HOME MEDICATIONS: Outpatient Medications Prior to Visit  Medication Sig Dispense Refill  . acetaminophen (TYLENOL) 325 MG tablet Take 650 mg by mouth every 6 (six) hours as needed (for pain or headaches).     [name] albuterol (VENTOLIN HFA) 108 (90 Base) MCG/ACT inhaler Inhale 2 puffs into the lungs every 6 (six) hours as needed for wheezing or shortness of breath. 18 g 5  . amitriptyline (ELAVIL) 50 MG tablet TAKE 1 TABLET(50 MG) BY MOUTH AT BEDTIME 30 tablet 3  . amLODipine (NORVASC) 10 MG tablet Take 1 tablet (10 mg total) by mouth daily. 30 tablet 4  . aspirin EC 81 MG EC tablet Take 1 tablet (81 mg total) by mouth daily. 90 tablet 3  . Blood Pressure Monitor KIT 1 Device by Does not apply route 2 (two) times daily. 1 kit 0  . carvedilol (COREG) 25 MG tablet Take 1 tablet (25 mg total)  by mouth 2 (two) times daily with a meal. 60 tablet 0  . cloNIDine (CATAPRES - DOSED IN MG/24 HR) 0.3 mg/24hr patch Place 1 patch (0.3 mg total) onto the skin once a week. 4 patch 6  . Evolocumab (REPATHA SURECLICK) 315 MG/ML SOAJ Inject 1 Dose into the skin every 14 (fourteen) days. 2 pen 11  . ezetimibe (ZETIA) 10 MG tablet Take 1 tablet (10 mg total) by mouth daily. 90 tablet 3    . furosemide (LASIX) 40 MG tablet Take one tablet daily as needed for shortness of breath or weight gain of more than 3 lbs in one day. (Patient taking differently: Take 40 mg by mouth daily as needed (shortness of breath or weight gain of more than 3 lbs in one day). ) 30 tablet 1  . hydrALAZINE (APRESOLINE) 100 MG tablet Take 1 tablet (100 mg total) by mouth every 8 (eight) hours. 90 tablet 0  . isosorbide mononitrate (IMDUR) 60 MG 24 hr tablet TAKE 1 TABLET BY MOUTH EVERY DAY 30 tablet 6  . minoxidil (LONITEN) 2.5 MG tablet daily.    . pantoprazole (PROTONIX) 40 MG tablet TAKE 1 TABLET(40 MG) BY MOUTH DAILY 30 tablet 3  . rosuvastatin (CRESTOR) 40 MG tablet Take 1 tablet (40 mg total) by mouth daily. 90 tablet 3  . spironolactone (ALDACTONE) 25 MG tablet daily.    . Tiotropium Bromide-Olodaterol (STIOLTO RESPIMAT) 2.5-2.5 MCG/ACT AERS Inhale 2 puffs into the lungs daily. 4 g 5   No facility-administered medications prior to visit.    PAST MEDICAL HISTORY: Past Medical History:  Diagnosis Date  . Adenomatous colon polyp 08/2015  . Anxiety   . Aortic disease ([place])   . Aortic dissection ([facility])    a. admx 04/2014 >> L renal infarct; a/c renal failure >> b.  s/p Bioprosthetic [place] and total arch replacement and staged endovascular repair of descending aortic aneurysm ([facility] - Dr. [name])  . CAD (coronary artery disease)    a. LHC 4/16:  oD1 60%  . Cardiomyopathy ([place])    a. non-ischemic - probably related to untreated HTN and ETOH abuse - Echo 3/13 with EF 35-40% >> b. Echo 4/16: Severe LVH, EF 55-60%, moderate AI, moderate MR, mild LAE, trivial effusion, known type B dissection with communication between true and false lumens with suprasternal images suggesting dissection plane may propagate to at least left subclavian takeoff, root above aortic valve okay    . Chronic abdominal pain   . Chronic combined systolic and diastolic congestive heart failure ([place])    a. 05/2011: Adm with pulm  edema/HTN urgency, EF 35-40% with diffuse hypokinesis and moderate to severe mitral regurgitation. Cardiomyopathy likely due to uncontrolled HTN and ETOH abuse - cath deferred due to renal insufficiency (felt due to uncontrolled HTN). bJodie Echevaria MV 06/2011: EF 37% and no ischemia or infarction. c. EF 45-50% by echo 01/2012.  [name] Chronic sinusitis   . CKD (chronic kidney disease)    a. 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  peak creatinine 3.46 during admx for aortic dissection 2/16.  sees Dr [name]  . DDD (degenerative disc disease), lumbar   . Depression   . Descending thoracic aortic aneurysm ([place])   . Dissecting aneurysm of thoracic aorta ([place])   . ETOH abuse    a. Reported to have quit 05/2011.  . Frequent headaches   . GERD (gastroesophageal reflux disease)   . Headache(784.0)    "q other day" (08/08/2013)  . Heart murmur   .  Hemorrhoid thrombosis   . History of echocardiogram    Echo 1/17:  Severe LVH, EF 55-60%, no RWMA, Gr 2 DD, AVR ok, mild to mod MR, mild LAE, mild reduced RVSF, mod RAE  . History of medication noncompliance   . HYPERLIPIDEMIA   . Hypertension    a. Hx of HTN urgency secondary to noncompliance. b. urinary metanephrine and catecholeamine levels normal 2013.  c. Renal art [name] 1/16:  No evidence of renal artery stenosis noted bilaterally.  . INGUINAL HERNIA   . Pneumonia ~ 2013  . Renal insufficiency   . Serrated adenoma of colon 08/2015  . Stroke ([place])   . Tobacco abuse   . Valvular heart disease    a. Echo 05/2011: moderate to severe eccentric MR and mild to moderate AI with prolapsing left coronary cusp. b. Echo 01/2012: mild-mod AI, mild dilitation of aortic root, mild MR.;  c. Echo 1/16: Severe LVH consistent with hypertrophic cardio myopathy, EF 50%, no RWMA, mod AI, mild MR, mild RAE, dilated Ao root (40 mm);     PAST SURGICAL HISTORY: Past Surgical History:  Procedure Laterality Date  . ANKLE SURGERY Bilateral    Fractures bilaterally  . AORTIC VALVE SURGERY   09/2014  . CORONARY ANGIOGRAPHY N/A 12/06/2017   Procedure: CORONARY ANGIOGRAPHY;  Surgeon: Belva Crome, MD;  Location: [facility];  Service: Cardiovascular;  Laterality: N/A;  . CORONARY ANGIOGRAPHY N/A 09/26/2018   Procedure: CORONARY ANGIOGRAPHY;  Surgeon: Nelva Bush, MD;  Location: [facility];  Service: Cardiovascular;  Laterality: N/A;  . CORONARY STENT INTERVENTION N/A 12/10/2017   Procedure: CORONARY STENT INTERVENTION;  Surgeon: [name] Sine, MD;  Location: [facility];  Service: Cardiovascular;  Laterality: N/A;  . FOOT FRACTURE SURGERY Bilateral 2004-2010   "got pins in both of them"  . HEMORRHOID SURGERY N/A 06/15/2015   Procedure: HEMORRHOIDECTOMY;  Surgeon: Stark Klein, MD;  Location: [facility];  Service: General;  Laterality: N/A;  . INGUINAL HERNIA REPAIR Right ~ 1996  . LEFT HEART CATHETERIZATION WITH CORONARY ANGIOGRAM N/A 06/21/2014   Procedure: LEFT HEART CATHETERIZATION WITH CORONARY ANGIOGRAM;  Surgeon: Larey Dresser, MD;  Location: [facility];  Service: Cardiovascular;  Laterality: N/A;  . LOOP RECORDER INSERTION N/A 06/19/2016   Procedure: Loop Recorder Insertion;  Surgeon: Deboraha Sprang, MD;  Location: [facility];  Service: Cardiovascular;  Laterality: N/A;  . TEE WITHOUT CARDIOVERSION N/A 03/12/2016   Procedure: TRANSESOPHAGEAL ECHOCARDIOGRAM (TEE);  Surgeon: Sanda Klein, MD;  Location: [facility];  Service: Cardiovascular;  Laterality: N/A;    FAMILY HISTORY: Family History  Problem Relation Age of Onset  . Hypertension Mother   . Hypertension Other   . Colon cancer Paternal Uncle   . Stroke Maternal Aunt   . Heart attack Brother   . Diabetes Maternal Aunt   . Lung cancer Maternal Uncle   . Other Sister        "breathing machine at night"  . Headache Sister   . Thyroid disease Sister   . Throat cancer Neg Hx   . Pancreatic cancer Neg Hx   . Esophageal cancer Neg Hx   . Kidney disease Neg Hx   . Liver disease Neg Hx      SOCIAL HISTORY: Social History   Socioeconomic History  . Marital status: Married    Spouse name: Not on file  . Number of children: 5  . Years of education: 39  . Highest education  level: Not on file  Occupational History  . Occupation: Disabled, part-time Programmer, systems    Comment: Disability  Tobacco Use  . Smoking status: Current Some Day Smoker    Packs/day: 0.25    Years: 23.00    Pack years: 5.75    Types: Cigarettes  . Smokeless tobacco: Never Used  . Tobacco comment: 3 to 4 per day  Substance and Sexual Activity  . Alcohol use: Yes    Alcohol/week: 1.0 standard drinks    Types: 1 Cans of beer per week    Comment: occasionally  . Drug use: Yes    Types: Marijuana    Comment: 2 times/week  . Sexual activity: Yes  Other Topics Concern  . Not on file  Social History Narrative   Fun: Enjoy his children   Social Determinants of Health   Financial Resource Strain:   . Difficulty of Paying Living Expenses:   Food Insecurity:   . Worried About Charity fundraiser in the Last Year:   . Arboriculturist in the Last Year:   Transportation Needs:   . Film/video editor (Medical):   [name] Lack of Transportation (Non-Medical):   Physical Activity:   . Days of Exercise per Week:   . Minutes of Exercise per Session:   Stress:   . Feeling of Stress :   Social Connections:   . Frequency of Communication with Friends and Family:   . Frequency of Social Gatherings with Friends and Family:   . Attends Religious Services:   . Active Member of Clubs or Organizations:   . Attends Archivist Meetings:   [name] Marital Status:   Intimate Partner Violence:   . Fear of Current or Ex-Partner:   . Emotionally Abused:   [name] Physically Abused:   . Sexually Abused:      PHYSICAL EXAM  There were no vitals filed for this visit. There is no height or weight on file to calculate BMI.  Generalized: Well developed, pleasant middle-aged African-American male, in no  acute distress  Head: normocephalic and atraumatic,. 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 Attention span and concentration appropriate. Recent and remote memory subjectively diminished.  Follows all commands speech and language fluent.  Cranial nerve II-XII: Pupils were equal round reactive to light extraocular movements were full, visual field were full on confrontational test. Facial sensation and strength were normal. hearing was intact to finger rubbing bilaterally. Uvula tongue midline. head turning and shoulder shrug were normal and symmetric.Tongue protrusion into cheek strength was normal. Motor: normal bulk and tone, full strength in the BUE, BLE, decreased right hand finger dexterity, no pronator drift. No focal weakness Sensory: Intact to light touch throughout all extremities.  Decreased pinprick, vibration and temperature sensation in right upper and lower extremity compared to left upper and lower extremity Coordination: finger-nose-finger, heel-to-shin bilaterally, decreased finger tapping right hand Reflexes: 1+ upper lower and symmetric plantar responses were flexor bilaterally. Gait and Station: Rising up from seated position without assistance, normal stance,  moderate  able to perform tiptoe, and heel walking without difficulty.        ASSESSMENT AND PLAN 49 y.o. African American male with PMH of aortic dissection s/p repair and reconstruction, systolic and diastolic  heart failure, and HTN admitted on 02/28/16 for confusion and right-sided weakness with stroke work-up revealing left MCA embolic infarct. In ED, had seizure and subsequent cardiac arrest.  He had loop recorder placed with  Dr. Caryl Comes which has not shown atrial fibrillation thus far.  Residual stroke deficits of right hemisensory impairment with paresthesias, and subjective memory/cognitive impairment.  Concern at prior visit for possible left occipital neuralgia with evidence of possible pilar cyst over occipital nerve root and was referred to dermatology.  Great benefit of headaches and occipital nerve pain with use of amitriptyline 25 mg nightly.  Continues to complain of left-sided postsurgical pain that limits daytime activity    Plan:  -Referral to dermatology for possible removal of questionable pilar cyst which could be pressing on occipital nerve sounds at prior visit.  Provided patient with dermatology office number to schedule initial evaluation -Increase amitriptyline from 25 mg nightly to 50 mg nightly for ongoing paresthesias and left-sided nerve type pains.  Advised him to follow-up with his cardiothoracic surgeon and may possibly benefit from pain management in the future -Follow-up with neuropsychology on 02/23/2019 for subjective cognitive concerns and ongoing depression -Continue aspirin for secondary stroke prevention -Advised to schedule follow-up visit with lipid clinic in regards to restarting Repatha and continue Crestor and Zetia.  He will also have lipid panel obtained at follow-up visit -Continue to monitor loop recorder for atrial fibrillation -Follow up with your primary care physician for stroke risk factor modification. Recommend maintain blood pressure goal 120/80, diabetes with hemoglobin A1c goal below 7.0% and lipids with LDL cholesterol goal below 70 mg/dL.     Follow-up in 6 months or call earlier if needed   Greater than 50% of this 25-minute visit was spent discussing residual deficits with ongoing paresthesias, discussion regarding continued use of amitriptyline, memory concerns and left-sided postsurgical pain.  Answered all questions to patient satisfaction along with reviewing  further treatment plan and interventions.    Frann Rider, AGNP-BC  [facility] [address] [place], [place] [address]  Phone [phone] Fax [phone] Note: This document was prepared with digital dictation and possible smart phrase technology. Any transcriptional errors that result from this process are unintentional.

## 2019-08-21 NOTE — Telephone Encounter (Signed)
error 

## 2019-08-26 IMAGING — CT CT ANGIO CHEST-ABD-PELV FOR DISSECTION W/ AND WO/W CM
2 of 7 series · 11 of 46 positions shown, 12 images · IV contrast (isovue)
Comparison: 08/11/2016

CLINICAL DATA: Follow-up of known thoracoabdominal aortic aneurysm.

EXAM:
CT ANGIOGRAPHY CHEST, ABDOMEN AND PELVIS
TECHNIQUE: Multidetector CT imaging through the chest, abdomen and pelvis was
performed using the standard protocol during bolus administration of
intravenous contrast. Multiplanar reconstructed images and MIPs were
obtained and reviewed to evaluate the vascular anatomy.
CONTRAST:  100 mL Isovue 370

[Series 6: arterial · axial · arterial · 0.68mm/px · z∈[+980,+1522]mm · 8 of 330 slices shown, 9 images]
[im 39/330  soft-tissue]
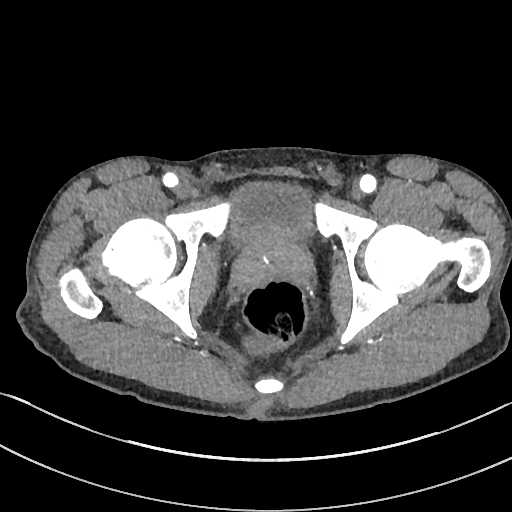
[im 39/330  bone]
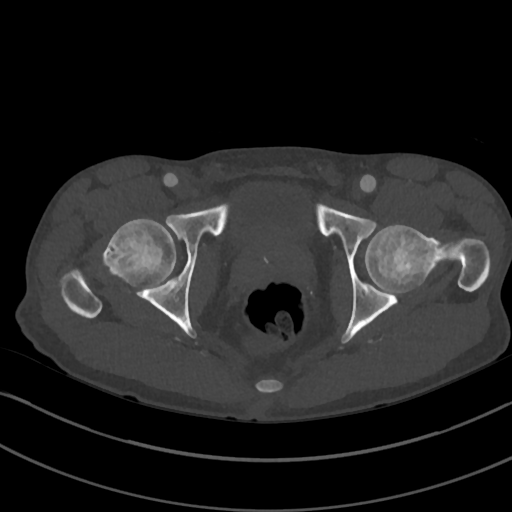
[im 78/330  soft-tissue]
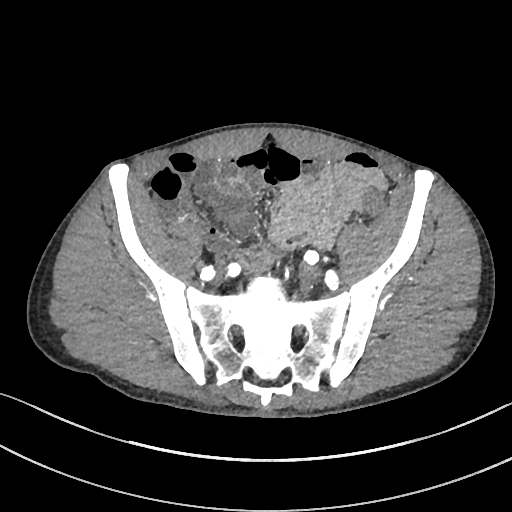
[im 117/330  soft-tissue]
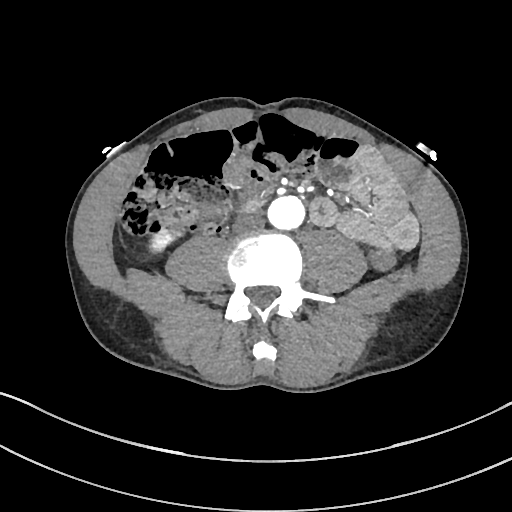
[im 155/330  soft-tissue]
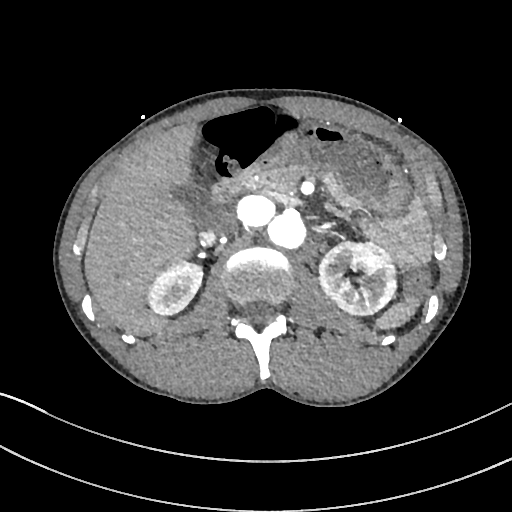
[im 194/330  soft-tissue]
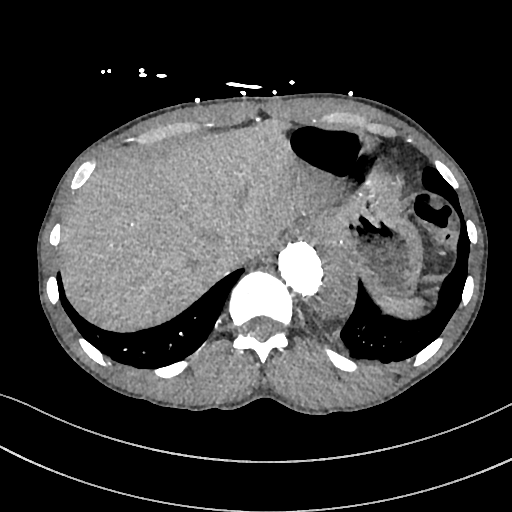
[im 233/330  soft-tissue]
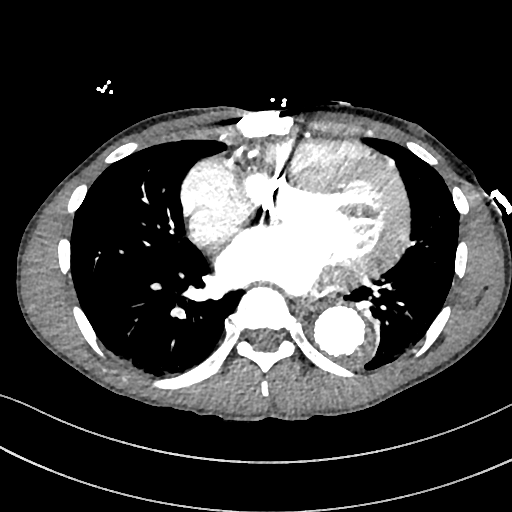
[im 271/330  soft-tissue]
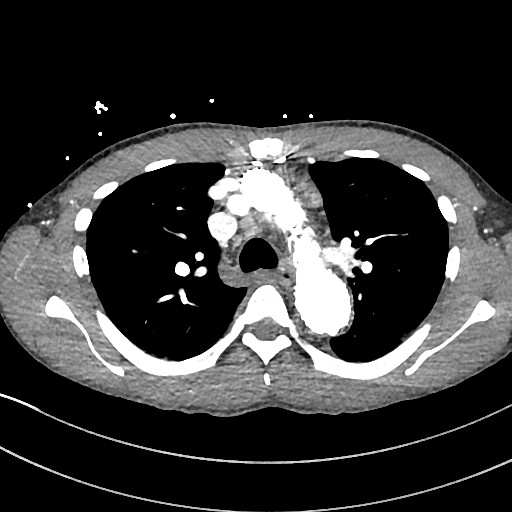
[im 310/330  soft-tissue]
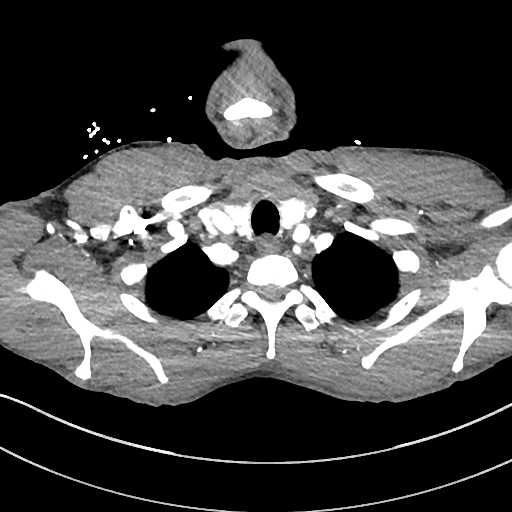

[Series 9: cor · coronal · 0.69mm/px · 3 of 125 slices shown]
[im 32/125  soft-tissue]
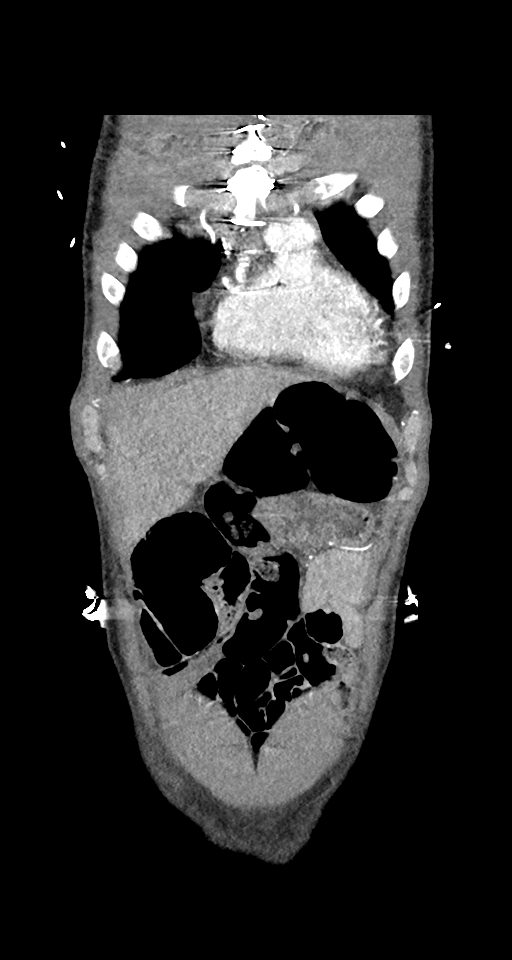
[im 63/125  soft-tissue]
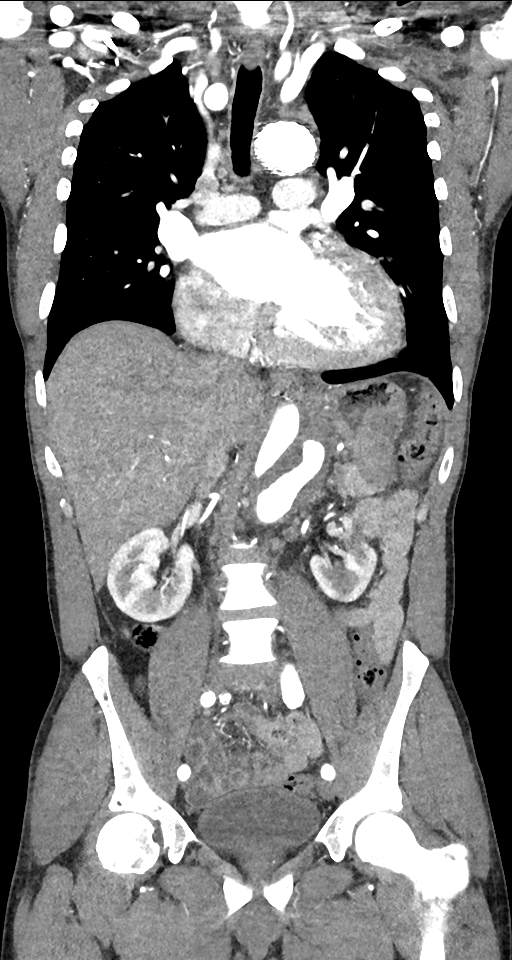
[im 94/125  soft-tissue]
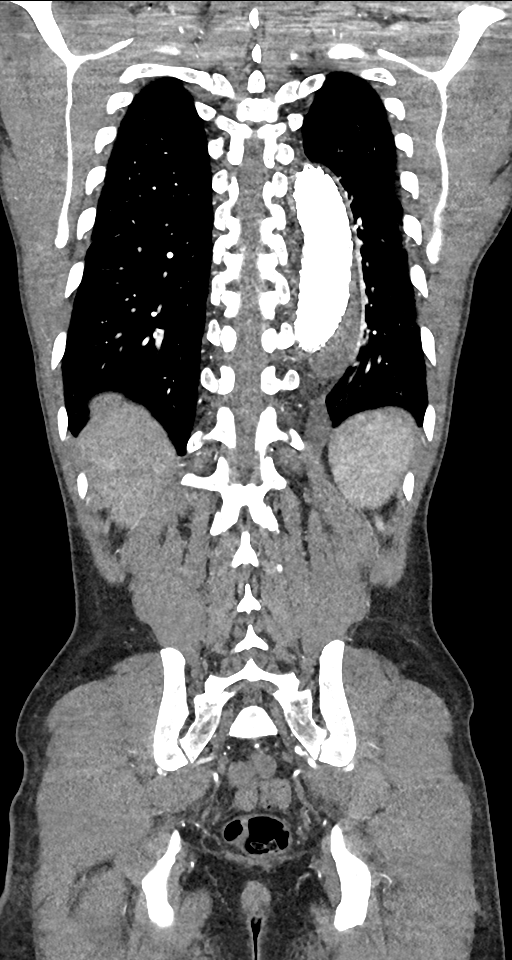

[11 of 46 positions shown; findings below may reference images not displayed]

FINDINGS: CTA CHEST FINDINGS

Cardiovascular: Noncontrast CT images of the chest demonstrate
postoperative changes with median sternotomy. Apparent aortic valve
replacement with ascending, arch, and descending aortic stent graft.
No intramural hematoma. Scattered vascular calcifications.

Images obtained during the arterial phase following contrast
administration demonstrate patency of the thoracic aorta and great
vessels. The lumen of the stent graft is patent. The false lumen/
aneurysm sac is thrombosed. No evidence of endo leak. Visualized
central pulmonary arteries appear patent. No large central pulmonary
embolus. Mild cardiac enlargement. No pericardial effusion.

Mediastinum/Nodes: Esophagus is decompressed. No significant
lymphadenopathy in the chest. Scattered lymph nodes are likely
reactive and are unchanged since previous study.

Lungs/Pleura: There is atelectasis in the lung bases. No focal
consolidation or edema. No pleural effusions. No pneumothorax.
Airways are patent.

Musculoskeletal: Sternotomy wires. Normal alignment of the thoracic
spine. No vertebral compression deformities. No destructive bone
lesions.

Review of the MIP images confirms the above findings.

CTA ABDOMEN AND PELVIS FINDINGS

VASCULAR

Aorta: Tortuous and ectatic aorta. The stent graft terminates that
the level of the low descending thoracic aorta just above the
abdominal hiatus. Below this level, there is persistent aneurysmal
dissection of the upper thoracic aorta with aneurysmal dilatation at
the level of the celiac axis measuring 4.5 x 6.1 cm. This
measurement is increased since the previous study. Direct
correlation is limited since the previous study was performed
without contrast. The infrarenal abdominal aorta is tortuous with
normal caliber. The dissection ends just below the level of the
renal artery origins.

Celiac: The celiac axis arises from the false limb and and is
patent.

SMA: The superior mesenteric artery arises from the true lumen and
is patent.

Renals: Single right renal artery arises from the true lumen and is
patent. There is a stent or graft connecting the single left renal
artery to the true lumen. The left renal artery is patent.

IMA: The inferior mesenteric artery arises from the true lumen below
the level of the dissection and is patent.

Inflow: Left iliac artery aneurysm measuring 2 cm diameter. No
change since previous study. Right iliac artery aneurysm measuring
1.6 cm diameter. No change since previous study. Calcification of
the common iliac, internal and external iliac artery's. Calcific and
non calcific stenosis of the origins of the internal iliac artery's
bilaterally but the vessels remain patent.

Veins: No obvious venous abnormality within the limitations of this
arterial phase study.

Review of the MIP images confirms the above findings.

NON-VASCULAR

Hepatobiliary: No focal liver abnormality is seen. No gallstones,
gallbladder wall thickening, or biliary dilatation.

Pancreas: Unremarkable. No pancreatic ductal dilatation or
surrounding inflammatory changes.

Spleen: Normal in size without focal abnormality.

Adrenals/Urinary Tract: Adrenal glands are unremarkable. Kidneys are
normal, without renal calculi, focal lesion, or hydronephrosis.
Bladder is unremarkable.

Stomach/Bowel: Stomach, small bowel, and colon are mostly
decompressed. Scattered stool throughout the colon. No small or
large bowel distention or inflammatory changes identified. Appendix
is not definitively visualized.

Lymphatic: No significant lymphadenopathy.

Reproductive: Prostate gland is enlarged, measuring 5.5 cm diameter.

Other: No free air or free fluid in the abdomen. No retroperitoneal
hematomas.

Musculoskeletal: No acute or significant osseous findings.

Review of the MIP images confirms the above findings.
IMPRESSION: 1. Postoperative changes with aortic valve replacement and stent
graft of the thoracic aorta. Graft appears patent. False
lumen/aneurysm sac is thrombosed. No endoleak identified.
2. No evidence of active pulmonary disease.
3. Residual aneurysm and dissection of the upper abdominal aorta
with dissection extending to the level just below the renal artery
origins. Aortic diameter at the level of the celiac artery origin is
4.5 x 6.1 cm, increased since previous study. Persistent flow in the
true and false lumens at this level. No evidence of aneurysm rupture
or retroperitoneal hematoma.
4. The celiac axis arises from the false lumen and is patent. Major
branch vessels of the abdominal aorta otherwise arise from the true
lumen and are patent.
5. Atherosclerotic disease causes narrowing of the internal iliac
artery's bilaterally.
6. Bilateral iliac artery aneurysms, measuring 1.6 cm on the right
and 2 cm on the left.
7. No acute process is identified involving the internal abdominal
organs. Prostate gland is enlarged.

## 2019-09-06 ENCOUNTER — Other Ambulatory Visit: Payer: Self-pay | Admitting: Internal Medicine

## 2019-09-29 IMAGING — DX DG CHEST 2V
3 series · 3 of 3 positions shown · non-contrast
Comparison: 08/11/2016

CLINICAL DATA: PT states L chest and abdominal pain ever since his
aortic aneurism repair in [REDACTED]. Hx of stroke,casd,pne,heart
mummur

EXAM:
CHEST - 2 VIEW

[chest pa]
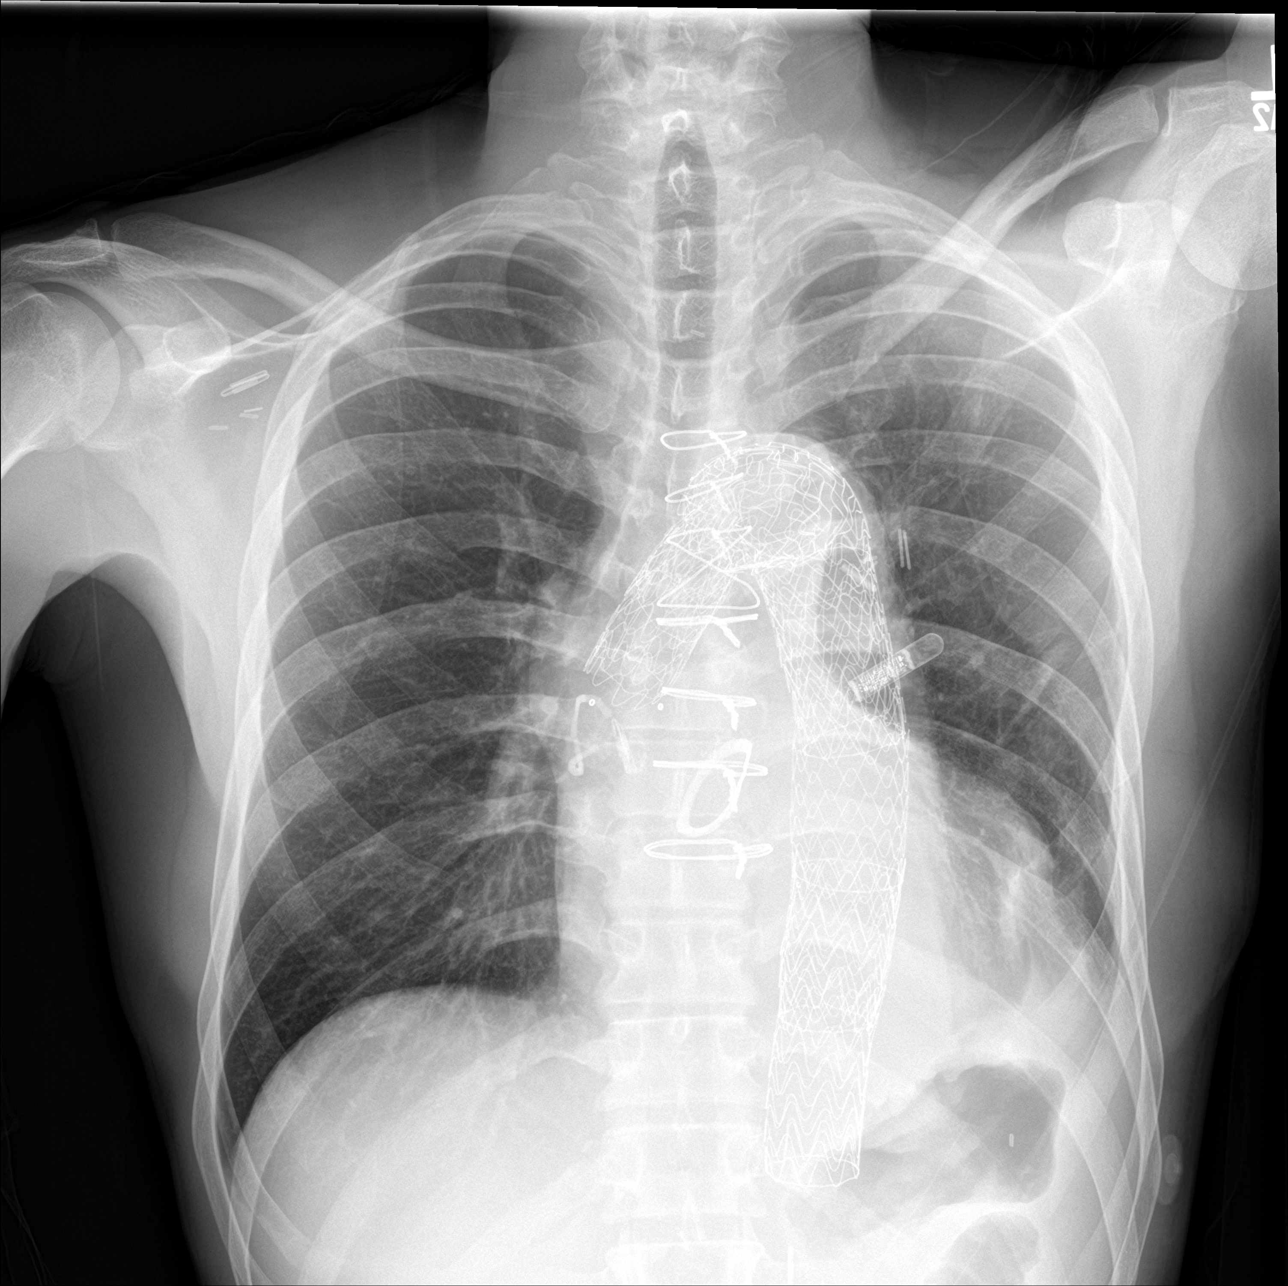

[chest lat (1 of 2)]
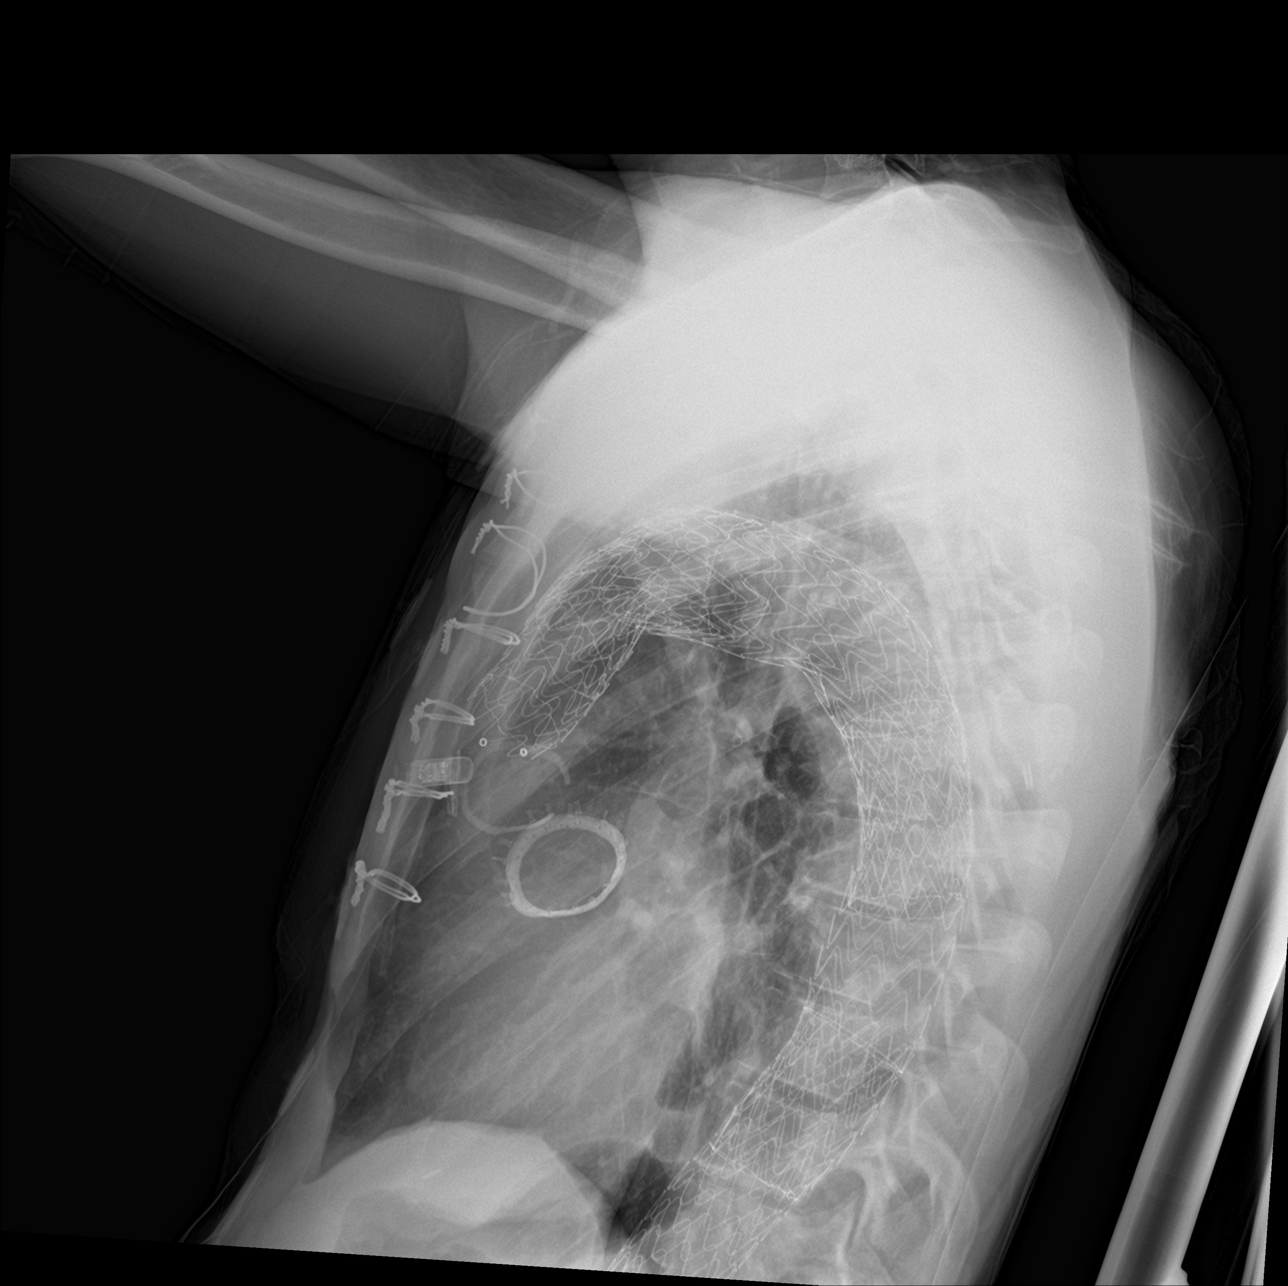

[chest lat (2 of 2)]
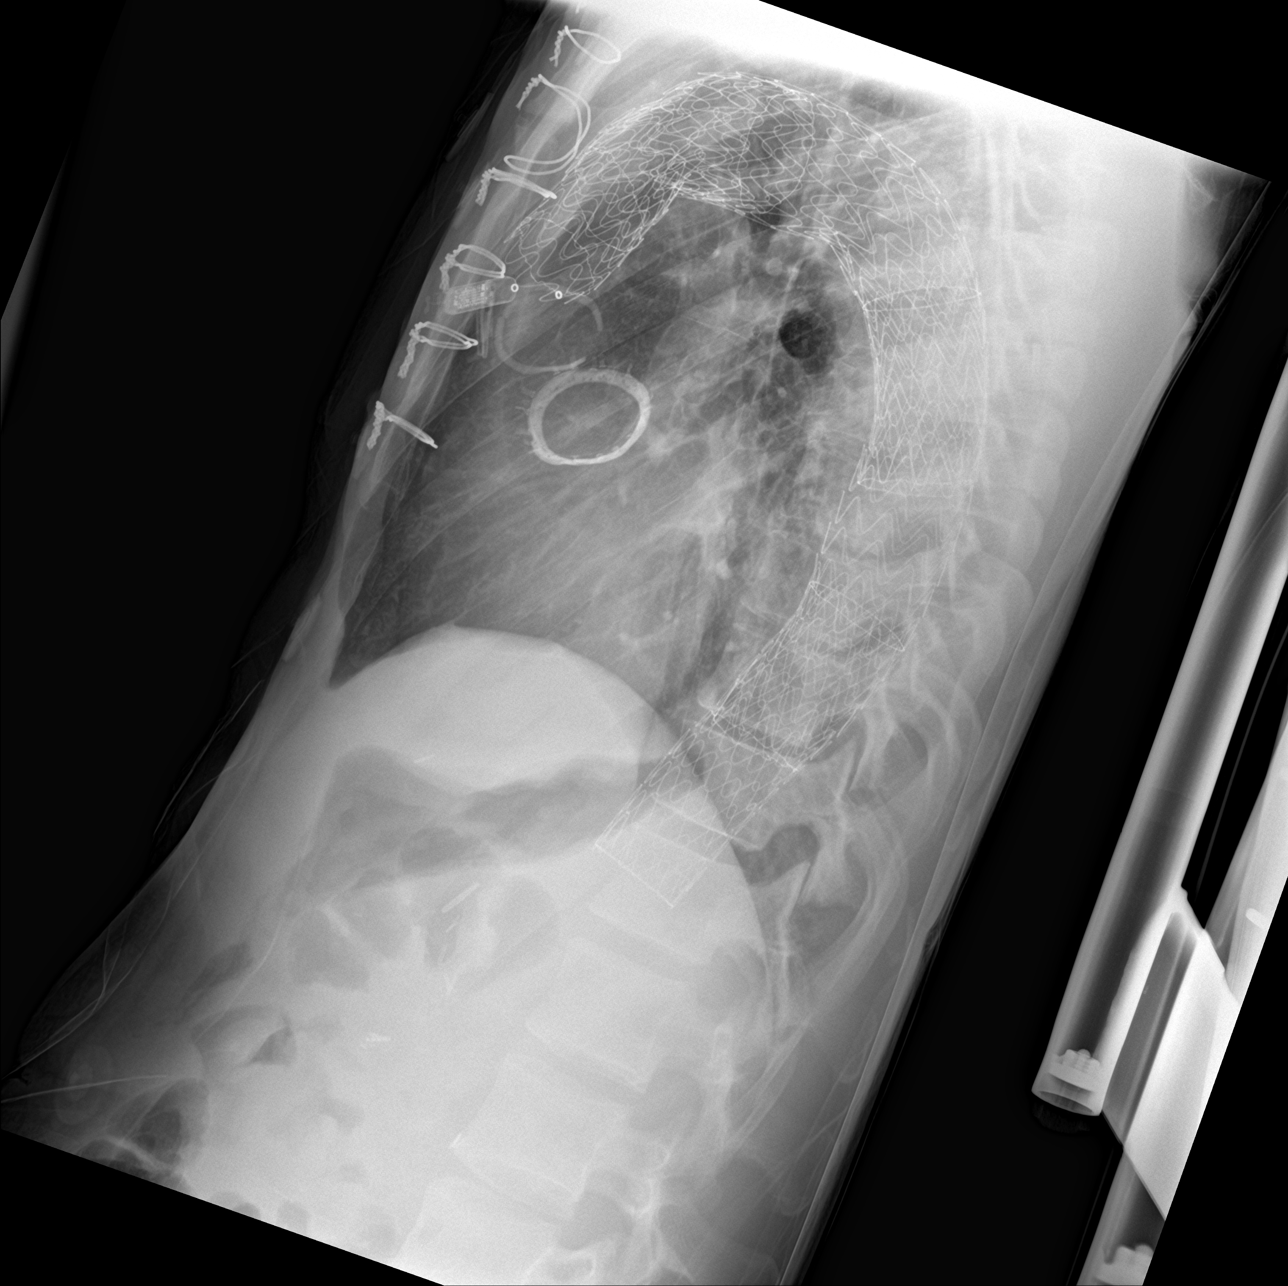

[3 of 3 positions shown; findings below may reference images not displayed]

FINDINGS: Some increase in subsegmental atelectasis versus infiltrate in
basilar segments of the left lower lobe. Right lung remains clear.

Previous median sternotomy and valve surgery. Stable appearance of
thoracic stent graft. Implanted event monitor overlies the left
hemithorax.

Small a left pleural effusion.  No pneumothorax.
IMPRESSION: 1. New small left pleural effusion and adjacent left lower lung
consolidation/atelectasis.
2. Stable postop changes.

## 2019-09-29 IMAGING — CT CT ANGIO CHEST-ABD-PELV FOR DISSECTION W/ AND WO/W CM
3 of 11 series · 10 of 46 positions shown, 14 images · IV contrast (APPLIED)
Comparison: 12/28/2016 and previous

CLINICAL DATA: Chest pain.  Previous aortic stent graft deployment.

EXAM:
CT ANGIOGRAPHY CHEST, ABDOMEN AND PELVIS
TECHNIQUE: Multidetector CT imaging through the chest, abdomen and pelvis was
performed using the standard protocol during bolus administration of
intravenous contrast. Multiplanar reconstructed images and MIPs were
obtained and reviewed to evaluate the vascular anatomy.
CONTRAST:  100mL [id number] IOPAMIDOL ([id number]) INJECTION 76%

[Series 7: arterial stent · axial · arterial · 0.61mm/px · z∈[+1025,+1247]mm · 2 of 333 slices shown, 5 images]
[im 111/333  soft-tissue]
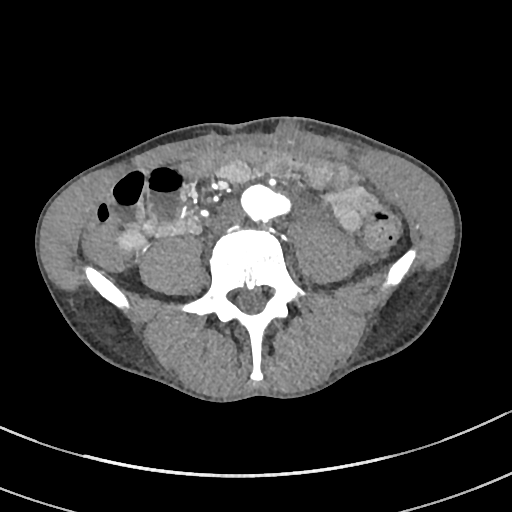
[im 111/333  lung]
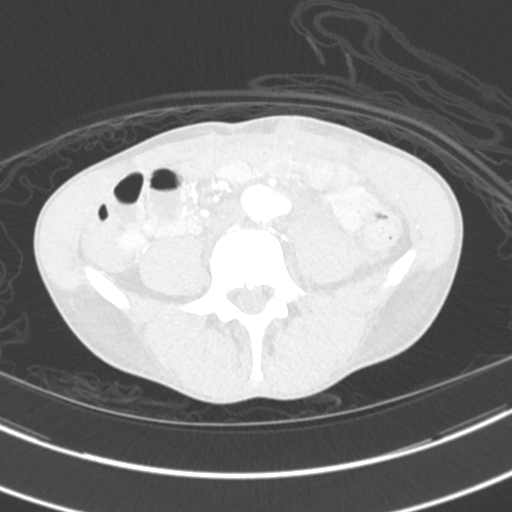
[im 111/333  bone]
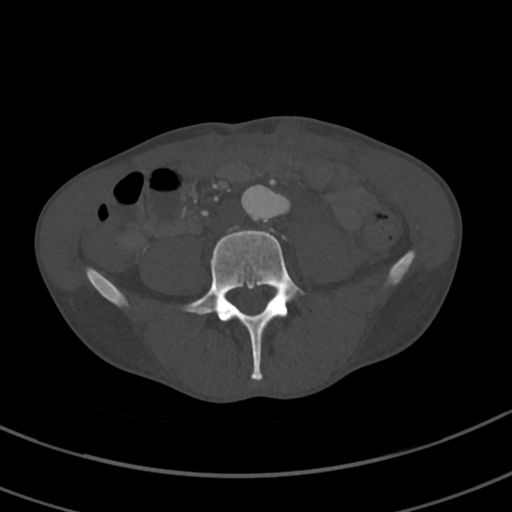
[im 222/333  soft-tissue]
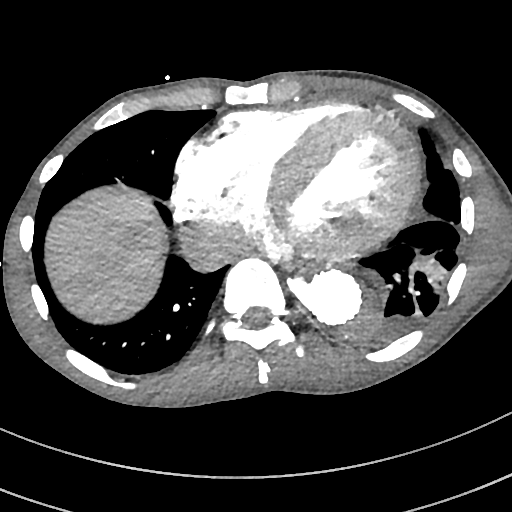
[im 222/333  lung]
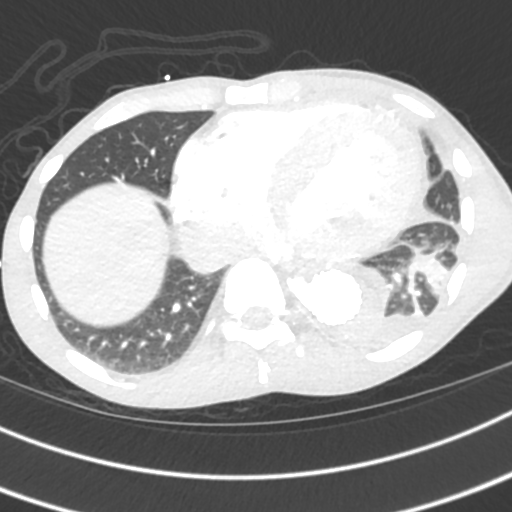

[Series 8: arterial thins · axial · arterial · 0.61mm/px · z∈[+867,+1247]mm · 6 of 1664 slices shown]
[im 159/1664  soft-tissue]
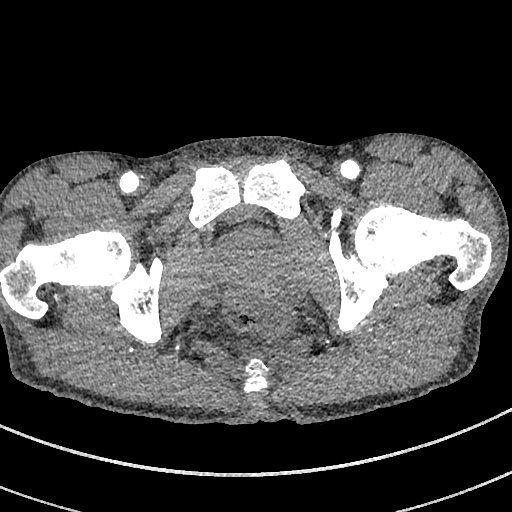
[im 317/1664  soft-tissue]
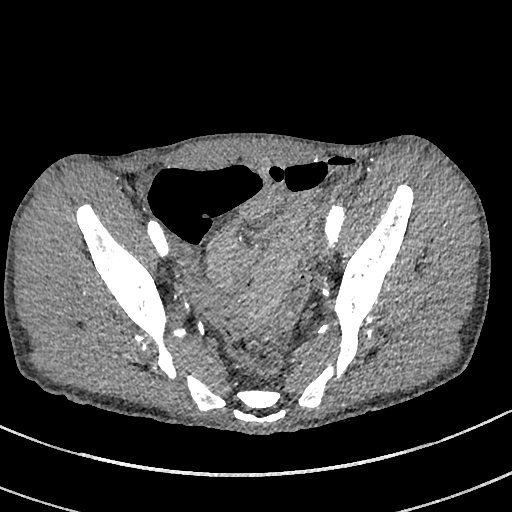
[im 555/1664  soft-tissue]
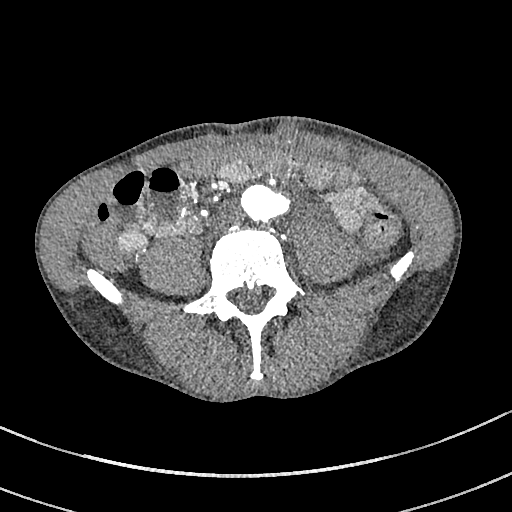
[im 713/1664  soft-tissue]
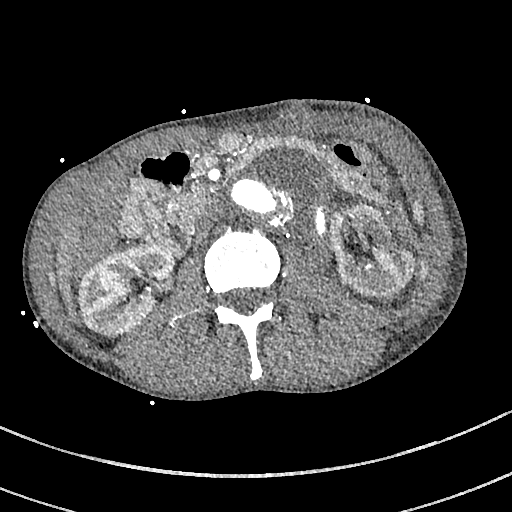
[im 951/1664  soft-tissue]
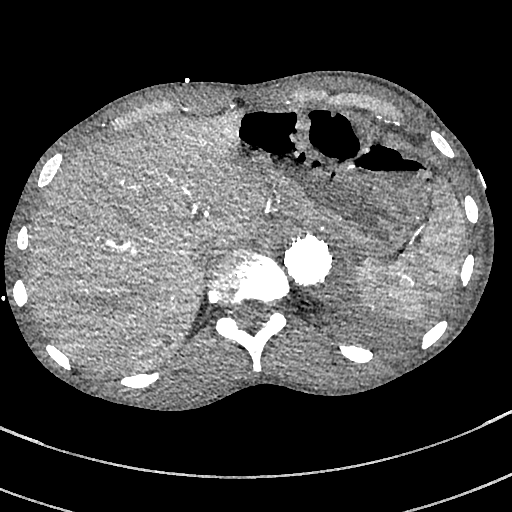
[im 1109/1664  soft-tissue]
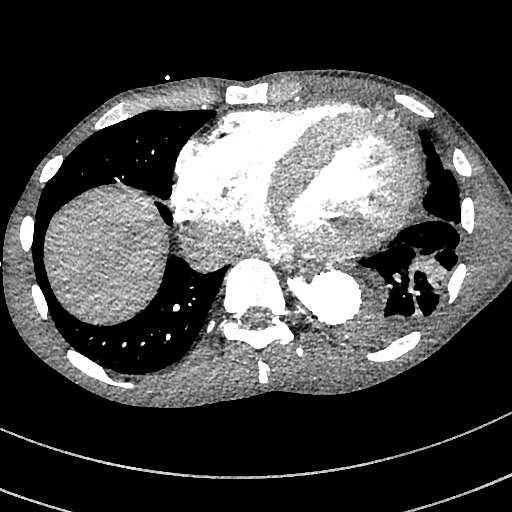

[Series 10: arterial cor · coronal · arterial · 0.78mm/px · 2 of 103 slices shown, 3 images]
[im 35/103  soft-tissue]
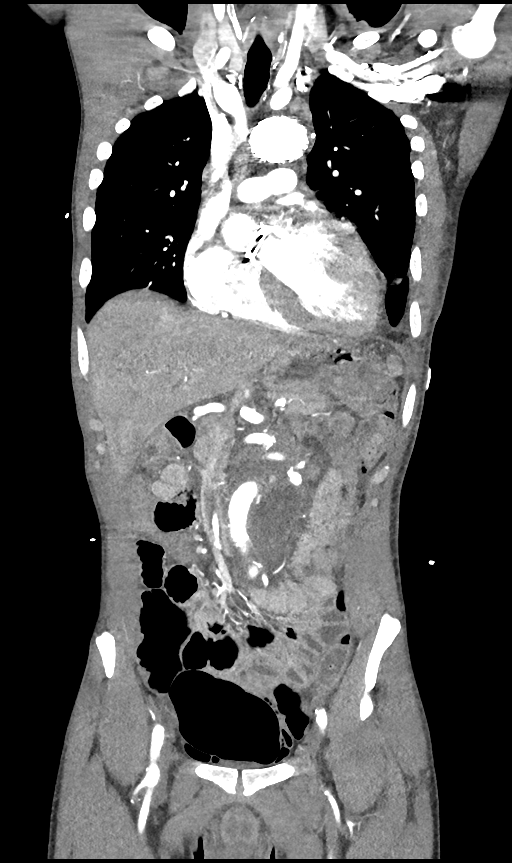
[im 35/103  bone]
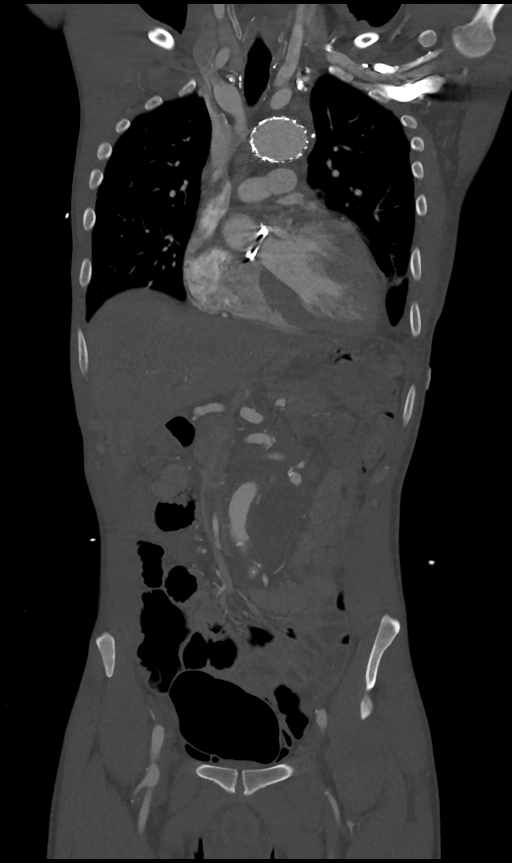
[im 69/103  soft-tissue]
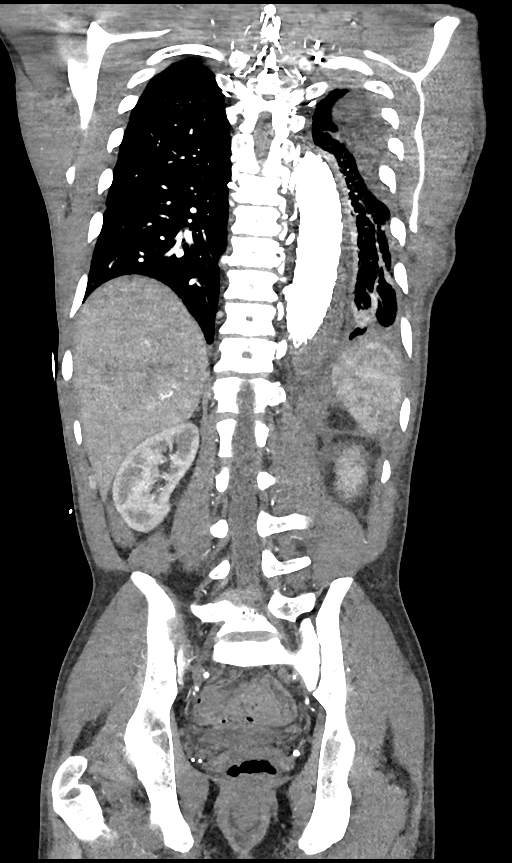

[10 of 46 positions shown; findings below may reference images not displayed]

FINDINGS: CTA CHEST FINDINGS

Cardiovascular: Left arm IV contrast administration. High-grade
stenosis in the left innominate vein with thyrocervical collaterals
providing left upper extremity drainage via the azygos venous
system. The SVC is patent. Right ventricle is nondilated.

Satisfactory opacification of pulmonary arteries noted, and there is
no evidence of pulmonary emboli. Patent bilateral pulmonary veins
drain into the left atrium. Left ventricular hypertrophy.

Stable changes of AVR with ascending aortic and arch stent graft
reconstruction, and patent surgical trunk grafts to native
brachiocephalic vessels. Stent graft extends through the length of
the descending thoracic aorta. No definite endoleak into distal
descending thoracic aneurysm, approximately 4.8 cm diameter at the
level of the coronary sinus, previously 5 cm by my measurement.

Mediastinum/Nodes: No pericardial effusion. No definite hilar or
mediastinal adenopathy.

Lungs/Pleura: New small loculated left pleural effusion with
posterior and medial components, as well as a small amount of fluid
in the left major fissure. There is inch progressive
consolidation/atelectasis/ scarring in the left lower lobe adjacent
to the effusion. No pneumothorax. Right lung clear.

Musculoskeletal: Previous median sternotomy and left thoracotomy.
Negative for fracture or worrisome bone lesion.

Review of the MIP images confirms the above findings.

CTA ABDOMEN AND PELVIS FINDINGS

VASCULAR

Aorta: There has been interval placement of an aortic graft from the
lower margin of the thoracic stent graft to the infrarenal segment.
Patent surgical trunk grafts to the celiac axis, SMA, and bilateral
renal arteries. Tortuous ectatic native infrarenal aorta measured up
to 2.5 cm diameter. No residual dissection or stenosis. There is
fluid and a few gas bubbles around the proximal abdominal aortic
graft within the walls of the native aorta. No evidence of
pseudoaneurysm or retrograde filling of the excluded false lumen of
previous aortic dissection.

Celiac: Patent trunk graft to the celiac axis, with patent distal
branch anatomy.

SMA: Patent trunk graft to the proximal SMA, patent distally with
classic distal branch anatomy.

Renals: Patent trunk graft to the right renal artery, widely patent
distally.

Patent somewhat tortuous trunk graft to the left renal artery.
Residual stent at the distal anastomosis appears patent, and distal
native branches appearing patent.

IMA: Patent without evidence of aneurysm, dissection, vasculitis or
significant stenosis.

Inflow: Ectatic native common iliac arteries, measured up to a 2 cm
diameter left, 1.5 cm right. Ectatic tortuous atheromatous external
iliac arteries, without stenosis or dissection. High-grade
short-segment origin stenosis of the left internal iliac artery.
Right internal iliac artery atheromatous but patent.

Veins: Patent hepatic veins, portal vein, SMV, splenic vein, renal
veins. The IVC is incompletely distended. Bilateral pelvic
phleboliths.

Review of the MIP images confirms the above findings.

NON-VASCULAR

Hepatobiliary: Stable subcentimeter hepatic segment 6 probable cyst.
No new liver abnormality is seen. No gallstones, gallbladder wall
thickening, or biliary dilatation.

Pancreas: Unremarkable. No pancreatic ductal dilatation or
surrounding inflammatory changes.

Spleen: Normal in size without focal abnormality. There is a
crescentic 5.5 x3.8 x 1 cm perisplenic fluid collection posteriorly.

Adrenals/Urinary Tract: Adrenals unremarkable. Focal areas of
parenchymal loss in left kidney, stable. No hydronephrosis. Urinary
bladder incompletely distended.

Stomach/Bowel: Stomach is nondistended. Small bowel and colon are
nondilated. The appendix is not discretely identified.

Lymphatic: No definite abdominal or pelvic adenopathy.

Reproductive: Prostatic enlargement with central coarse
calcification.

Other: No definite ascites.  No free air.

Musculoskeletal: Healing presumed incision in the left anterior body
wall. Negative for fracture or worrisome bone lesion.

Review of the MIP images confirms the above findings.
IMPRESSION: 1. Stable changes of AVR, aortic stent graft. Patent trunk grafts to
brachiocephalic arteries.
2. Interval aortic graft repair of dissection/aneurysm, with patent
trunk grafts to celiac axis, SMA, and bilateral renal arteries.
3. Crescentic 5.5 cm posterior perisplenic fluid collection,
possibly resolving hematoma but nonspecific.
4. 2 cm left and 1.5 cm right common iliac artery fusiform
aneurysmal dilatation, stable.

## 2019-10-09 ENCOUNTER — Ambulatory Visit (INDEPENDENT_AMBULATORY_CARE_PROVIDER_SITE_OTHER): Payer: Medicare HMO | Admitting: Internal Medicine

## 2019-10-09 ENCOUNTER — Other Ambulatory Visit: Payer: Self-pay

## 2019-10-09 ENCOUNTER — Encounter: Payer: Self-pay | Admitting: Internal Medicine

## 2019-10-09 VITALS — BP 172/89 | HR 78 | Ht 71.0 in | Wt 156.6 lb

## 2019-10-09 DIAGNOSIS — Z8673 Personal history of transient ischemic attack (TIA), and cerebral infarction without residual deficits: Secondary | ICD-10-CM

## 2019-10-09 DIAGNOSIS — I5022 Chronic systolic (congestive) heart failure: Secondary | ICD-10-CM

## 2019-10-09 DIAGNOSIS — E785 Hyperlipidemia, unspecified: Secondary | ICD-10-CM | POA: Diagnosis not present

## 2019-10-09 DIAGNOSIS — Z9889 Other specified postprocedural states: Secondary | ICD-10-CM | POA: Diagnosis not present

## 2019-10-09 DIAGNOSIS — I10 Essential (primary) hypertension: Secondary | ICD-10-CM | POA: Diagnosis not present

## 2019-10-09 LAB — LIPID PANEL
Chol/HDL Ratio: 2.5 ratio (ref 0.0–5.0)
Cholesterol, Total: 143 mg/dL (ref 100–199)
HDL: 58 mg/dL (ref >39)
LDL Chol Calc (NIH): 66 mg/dL (ref 0–99)
Triglycerides: 106 mg/dL (ref 0–149)
VLDL Cholesterol Cal: 19 mg/dL (ref 5–40)

## 2019-10-09 NOTE — Progress Notes (Signed)
Needs appointment in the office with me or NP/PA.  Also sounds like he needs paramedicine.  Should be seen in the next 2 wks.

## 2019-10-09 NOTE — Patient Instructions (Addendum)
Medication Instructions:  No Changes *If you need a refill on your cardiac medications before your next appointment, please call your pharmacy*   Lab Work: Lipid Panel Today If you have labs (blood work) drawn today and your tests are completely normal, you will receive your results only by: [name] MyChart Message (if you have MyChart) OR . A paper copy in the mail If you have any lab test that is abnormal or we need to change your treatment, we will call you to review the results.   Testing/Procedures: None Ordered   Follow-Up: At [facility], you and your health needs are our priority.  As part of our continuing mission to provide you with exceptional heart care, we have created designated Provider Care Teams.  These Care Teams include your primary Cardiologist (physician) and Advanced Practice Providers (APPs -  Physician Assistants and Nurse Practitioners) who all work together to provide you with the care you need, when you need it.  We recommend signing up for the patient portal called "MyChart".  Sign up information is provided on this After Visit Summary.  MyChart is used to connect with patients for Virtual Visits (Telemedicine).  Patients are able to view lab/test results, encounter notes, upcoming appointments, etc.  Non-urgent messages can be sent to your provider as well.   To learn more about what you can do with MyChart, go to [email or web address].    Your next appointment:   6 month(s)  The format for your next appointment:   In Person  Provider:   K. Mali Hilty, MD   Other Instructions Referral to Dr. Skeet Latch for Hypertension Clinic Follow Up with Dr. Aundra Dubin at [facility]
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
## 2019-10-19 ENCOUNTER — Ambulatory Visit: Payer: Medicare HMO | Admitting: Cardiovascular Disease

## 2019-11-02 ENCOUNTER — Encounter: Payer: Self-pay | Admitting: Cardiovascular Disease

## 2019-11-02 ENCOUNTER — Other Ambulatory Visit: Payer: Self-pay

## 2019-11-02 ENCOUNTER — Ambulatory Visit (INDEPENDENT_AMBULATORY_CARE_PROVIDER_SITE_OTHER): Payer: Medicare HMO | Admitting: Cardiovascular Disease

## 2019-11-02 VITALS — BP 182/100 | HR 63 | Ht 71.0 in | Wt 153.0 lb

## 2019-11-02 DIAGNOSIS — R400 Somnolence: Secondary | ICD-10-CM

## 2019-11-02 DIAGNOSIS — R0683 Snoring: Secondary | ICD-10-CM | POA: Diagnosis not present

## 2019-11-02 DIAGNOSIS — I10 Essential (primary) hypertension: Secondary | ICD-10-CM | POA: Diagnosis not present

## 2019-11-02 DIAGNOSIS — I43 Cardiomyopathy in diseases classified elsewhere: Secondary | ICD-10-CM

## 2019-11-02 DIAGNOSIS — I25119 Atherosclerotic heart disease of native coronary artery with unspecified angina pectoris: Secondary | ICD-10-CM

## 2019-11-02 DIAGNOSIS — I119 Hypertensive heart disease without heart failure: Secondary | ICD-10-CM

## 2019-11-02 DIAGNOSIS — Z8679 Personal history of other diseases of the circulatory system: Secondary | ICD-10-CM

## 2019-11-02 LAB — COMPREHENSIVE METABOLIC PANEL
ALT: 9 IU/L (ref 0–44)
AST: 15 IU/L (ref 0–40)
Albumin/Globulin Ratio: 1.3 (ref 1.2–2.2)
Albumin: 4.3 g/dL (ref 4.0–5.0)
Alkaline Phosphatase: 87 IU/L (ref 48–121)
BUN/Creatinine Ratio: 8 — ABNORMAL LOW (ref 9–20)
BUN: 27 mg/dL — ABNORMAL HIGH (ref 6–24)
Bilirubin Total: 0.3 mg/dL (ref 0.0–1.2)
CO2: 22 mmol/L (ref 20–29)
Calcium: 9.7 mg/dL (ref 8.7–10.2)
Chloride: 107 mmol/L — ABNORMAL HIGH (ref 96–106)
Creatinine, Ser: 3.27 mg/dL — ABNORMAL HIGH (ref 0.76–1.27)
GFR calc Af Amer: 25 mL/min/{1.73_m2} — ABNORMAL LOW (ref >59)
GFR calc non Af Amer: 21 mL/min/{1.73_m2} — ABNORMAL LOW (ref >59)
Globulin, Total: 3.2 g/dL (ref 1.5–4.5)
Glucose: 83 mg/dL (ref 65–99)
Potassium: 5 mmol/L (ref 3.5–5.2)
Sodium: 140 mmol/L (ref 134–144)
Total Protein: 7.5 g/dL (ref 6.0–8.5)

## 2019-11-02 LAB — TSH: TSH: 1.68 u[IU]/mL (ref 0.450–4.500)

## 2019-11-02 MED ORDER — DOXAZOSIN MESYLATE 4 MG PO TABS: 4.0000 mg | ORAL_TABLET | Freq: Every day | ORAL | 1 refills | Status: DC

## 2019-11-02 NOTE — Patient Instructions (Addendum)
Blood pressure medication times:  8 AM: Amlodipine 10 mg Carvedilol 25 mg  12 PM: Minoxidine  8 PM:  Carvedilol 25 mg Doxazosin 4 mg  11 PM:  Lisinopril (you could take this one at 8PM as well if you want to have fewer times when you take medicine throughout the day)  Sundays: Change Clonidine patch  Medication Instructions:  START DOXAZOSIN 4 MG AT BEDTIME   Labwork: TSH/CMET TODAY    Testing/Procedures: Your physician has recommended that you have a sleep study. This test records several body functions during sleep, including: brain activity, eye movement, oxygen and carbon dioxide blood levels, heart rate and rhythm, breathing rate and rhythm, the flow of air through your mouth and nose, snoring, body muscle movements, and chest and belly movement. THE OFFICE WILL CALL YOU TO SCHEDULE ONCE YOUR INSURANCE HAS APPROVED   YOU WILL NEED A COVID TEST 3 DAYS PRIOR TO YOU SLEEP STUDY, THIS WILL BE SCHEDULED WHEN YOUR SLEEP STUDY IS SCHEDULED    Follow-Up: 12/05/2019 AT 8:00 AM WITH PHARM D     Special Instructions:   MONITOR YOUR BLOOD PRESSURE TWICE A DAY, LOG IN BOOK PROVIDED. BRING BOOK, BLOOD PRESSURE MACHINE, AND ALL MEDICATIONS TO FOLLOW UP    DASH Eating Plan DASH stands for "Dietary Approaches to Stop Hypertension." The DASH eating plan is a healthy eating plan that has been shown to reduce high blood pressure (hypertension). It may also reduce your risk for type 2 diabetes, heart disease, and stroke. The DASH eating plan may also help with weight loss. What are tips for following this plan?  General guidelines  Avoid eating more than 2,300 mg (milligrams) of salt (sodium) a day. If you have hypertension, you may need to reduce your sodium intake to 1,500 mg a day.  Limit alcohol intake to no more than 1 drink a day for nonpregnant women and 2 drinks a day for men. One drink equals 12 oz of beer, 5 oz of wine, or 1 oz of hard liquor.  Work with your health care  provider to maintain a healthy body weight or to lose weight. Ask what an ideal weight is for you.  Get at least 30 minutes of exercise that causes your heart to beat faster (aerobic exercise) most days of the week. Activities may include walking, swimming, or biking.  Work with your health care provider or diet and nutrition specialist (dietitian) to adjust your eating plan to your individual calorie needs. Reading food labels   Check food labels for the amount of sodium per serving. Choose foods with less than 5 percent of the Daily Value of sodium. Generally, foods with less than 300 mg of sodium per serving fit into this eating plan.  To find whole grains, look for the word "whole" as the first word in the ingredient list. Shopping  Buy products labeled as "low-sodium" or "no salt added."  Buy fresh foods. Avoid canned foods and premade or frozen meals. Cooking  Avoid adding salt when cooking. Use salt-free seasonings or herbs instead of table salt or sea salt. Check with your health care provider or pharmacist before using salt substitutes.  Do not fry foods. Cook foods using healthy methods such as baking, boiling, grilling, and broiling instead.  Cook with heart-healthy oils, such as olive, canola, soybean, or sunflower oil. Meal planning  Eat a balanced diet that includes: ? 5 or more servings of fruits and vegetables each day. At each meal, try to fill half  of your plate with fruits and vegetables. ? Up to 6-8 servings of whole grains each day. ? Less than 6 oz of lean meat, poultry, or fish each day. A 3-oz serving of meat is about the same size as a deck of cards. One egg equals 1 oz. ? 2 servings of low-fat dairy each day. ? A serving of nuts, seeds, or beans 5 times each week. ? Heart-healthy fats. Healthy fats called Omega-3 fatty acids are found in foods such as flaxseeds and coldwater fish, like sardines, salmon, and mackerel.  Limit how much you eat of the  following: ? Canned or prepackaged foods. ? Food that is high in trans fat, such as fried foods. ? Food that is high in saturated fat, such as fatty meat. ? Sweets, desserts, sugary drinks, and other foods with added sugar. ? Full-fat dairy products.  Do not salt foods before eating.  Try to eat at least 2 vegetarian meals each week.  Eat more home-cooked food and less restaurant, buffet, and fast food.  When eating at a restaurant, ask that your food be prepared with less salt or no salt, if possible. What foods are recommended? The items listed may not be a complete list. Talk with your dietitian about what dietary choices are best for you. Grains Whole-grain or whole-wheat bread. Whole-grain or whole-wheat pasta. Brown rice. [place]. Bulgur. Whole-grain and low-sodium cereals. Pita bread. Low-fat, low-sodium crackers. Whole-wheat flour tortillas. Vegetables Fresh or frozen vegetables (raw, steamed, roasted, or grilled). Low-sodium or reduced-sodium tomato and vegetable juice. Low-sodium or reduced-sodium tomato sauce and tomato paste. Low-sodium or reduced-sodium canned vegetables. Fruits All fresh, dried, or frozen fruit. Canned fruit in natural juice (without added sugar). Meat and other protein foods Skinless chicken or [place]. Ground chicken or [place]. Pork with fat trimmed off. Fish and seafood. Egg whites. Dried beans, peas, or lentils. Unsalted nuts, nut butters, and seeds. Unsalted canned beans. Lean cuts of beef with fat trimmed off. Low-sodium, lean deli meat. Dairy Low-fat (1%) or fat-free (skim) milk. Fat-free, low-fat, or reduced-fat cheeses. Nonfat, low-sodium ricotta or cottage cheese. Low-fat or nonfat yogurt. Low-fat, low-sodium cheese. Fats and oils Soft margarine without trans fats. Vegetable oil. Low-fat, reduced-fat, or light mayonnaise and salad dressings (reduced-sodium). Canola, safflower, olive, soybean, and sunflower oils. Avocado. Seasoning and other  foods Herbs. Spices. Seasoning mixes without salt. Unsalted popcorn and pretzels. Fat-free sweets. What foods are not recommended? The items listed may not be a complete list. Talk with your dietitian about what dietary choices are best for you. Grains Baked goods made with fat, such as croissants, muffins, or some breads. Dry pasta or rice meal packs. Vegetables Creamed or fried vegetables. Vegetables in a cheese sauce. Regular canned vegetables (not low-sodium or reduced-sodium). Regular canned tomato sauce and paste (not low-sodium or reduced-sodium). Regular tomato and vegetable juice (not low-sodium or reduced-sodium). Angie Fava. Olives. Fruits Canned fruit in a light or heavy syrup. Fried fruit. Fruit in cream or butter sauce. Meat and other protein foods Fatty cuts of meat. Ribs. Fried meat. Berniece Salines. Sausage. Bologna and other processed lunch meats. Salami. Fatback. Hotdogs. Bratwurst. Salted nuts and seeds. Canned beans with added salt. Canned or smoked fish. Whole eggs or egg yolks. Chicken or [place] with skin. Dairy Whole or 2% milk, cream, and half-and-half. Whole or full-fat cream cheese. Whole-fat or sweetened yogurt. Full-fat cheese. Nondairy creamers. Whipped toppings. Processed cheese and cheese spreads. Fats and oils Butter. Stick margarine. Lard. Shortening. Ghee. Bacon fat. Tropical oils,  such as coconut, palm kernel, or palm oil. Seasoning and other foods Salted popcorn and pretzels. Onion salt, garlic salt, seasoned salt, table salt, and sea salt. Worcestershire sauce. Tartar sauce. Barbecue sauce. Teriyaki sauce. Soy sauce, including reduced-sodium. Steak sauce. Canned and packaged gravies. Fish sauce. Oyster sauce. Cocktail sauce. Horseradish that you find on the shelf. Ketchup. Mustard. Meat flavorings and tenderizers. Bouillon cubes. Hot sauce and Tabasco sauce. Premade or packaged marinades. Premade or packaged taco seasonings. Relishes. Regular salad dressings. Where to find  more information:  National Heart, Lung, and [place]: https://wilson-eaton.com/  American Heart Association: www.heart.org Summary  The DASH eating plan is a healthy eating plan that has been shown to reduce high blood pressure (hypertension). It may also reduce your risk for type 2 diabetes, heart disease, and stroke.  With the DASH eating plan, you should limit salt (sodium) intake to 2,300 mg a day. If you have hypertension, you may need to reduce your sodium intake to 1,500 mg a day.  When on the DASH eating plan, aim to eat more fresh fruits and vegetables, whole grains, lean proteins, low-fat dairy, and heart-healthy fats.  Work with your health care provider or diet and nutrition specialist (dietitian) to adjust your eating plan to your individual calorie needs. This information is not intended to replace advice given to you by your health care provider. Make sure you discuss any questions you have with your health care provider. Document Released: 02/19/2011 Document Revised: 02/12/2017 Document Reviewed: 02/24/2016 Elsevier Patient Education  2020 [employer].

## 2019-11-02 NOTE — Progress Notes (Signed)
Hypertension Clinic Initial Assessment:    Date:  11/03/2019   ID:  [name], DOB May 13, 1970, MRN [id number]  PCP:  Kerin Perna, NP  Cardiologist:  [name], MD  Nephrologist:  Referring MD: Pixie Casino, MD   CC: Hypertension  History of Present Illness:    [name] is a 49 y.o. male with a hx of CAD status post PCI, type I aortic dissection with Bentall, aortic arch replacement, CVA, severe LVH, and endovascular repair of the descending aorta, chronic systolic diastolic heart failure, resistant hypertension, hyperlipidemia, and prior tobacco abuse here to establish care in the hypertension clinic. He had an aortic dissection in 2016.  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  His baseline creatinine is 2.1-2.3.  He had a bioprosthetic Bentall procedure with a total arch replacement.  He also had staged endovascular repair of the descending aortic aneurysm by Dr. [name] at [facility].  He has struggled with poorly controlled hypertension.  He had an NSTEMI in the setting of running out of his clonidine patches.  He has been seen by our lipid clinic and was started on Repatha.  He last saw Dr. Debara Pickett on 7/26 and his BP was 172/89.  He was referred to the advanced hypertension clinic for further management of his BP.  For the last couple months his body has been feeling weak and tired. He feels like he did before he had his aneurysm rupture.  He has chronic chest pain that is unchanged but no shortness of breath.  He notes that his weight is fluctuates.  He denies LE edema, orthopena or PND.  His appetite has been poor.  He sometimes goes the whole day without eating.  He mostly cooks at home but he does eat out. He uses [employer] and tries to limit his sodium intake.  He gets exercise playing with his grandchildren 2-3 times per week.  He gets short of breath chasing them.  He has to stop when cutting his grass with a push mower.   His heart starts beating hard and he has to rest.  He broke 5 ribs on his left and has chronic pain. When he checks his BP at home at best it will be around 140/80.  Other days it is as high as 190.  He struggles with insomnia.  His daughter says he snores and stops breathing.  He feels very fatigued throughout the day.  He doesn't feel rested in the AM.   Current medication regimen: AM Meds 6:30-10 Amlodipine Carvedilol  11:30/12 Minoxidil  Spironolactone  Evening 4:30/5 Carvedilol  Bed before 11 Lisinopril   Clonidine Sundays  Previous antihypertensives: Imdur Hydralazine   Past Medical History:  Diagnosis Date  . Adenomatous colon polyp 08/2015  . Anxiety   . Aortic disease ([place])   . Aortic dissection ([facility])    a. admx 04/2014 >> L renal infarct; a/c renal failure >> b.  s/p Bioprosthetic Bentall and total arch replacement and staged endovascular repair of descending aortic aneurysm ([facility] - Dr. [name])  . CAD (coronary artery disease)    a. LHC 4/16:  oD1 60%  . Cardiomyopathy ([place])    a. non-ischemic - probably related to untreated HTN and ETOH abuse - Echo 3/13 with EF 35-40% >> b. Echo 4/16: Severe LVH, EF 55-60%, moderate AI, moderate MR, mild LAE, trivial effusion, known type B dissection with communication between true and false lumens with suprasternal  images suggesting dissection plane may propagate to at least left subclavian takeoff, root above aortic valve okay    . Chronic abdominal pain   . Chronic combined systolic and diastolic congestive heart failure ([place])    a. 05/2011: Adm with pulm edema/HTN urgency, EF 35-40% with diffuse hypokinesis and moderate to severe mitral regurgitation. Cardiomyopathy likely due to uncontrolled HTN and ETOH abuse - cath deferred due to renal insufficiency (felt due to uncontrolled HTN). bJodie Echevaria MV 06/2011: EF 37% and no ischemia or infarction. c. EF 45-50% by echo 01/2012.  [name] Chronic sinusitis   . CKD (chronic kidney disease)     a. 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  peak creatinine 3.46 during admx for aortic dissection 2/16.  sees Dr [name]  . DDD (degenerative disc disease), lumbar   . Depression   . Descending thoracic aortic aneurysm ([place])   . Dissecting aneurysm of thoracic aorta ([place])   . ETOH abuse    a. Reported to have quit 05/2011.  . Frequent headaches   . GERD (gastroesophageal reflux disease)   . Headache(784.0)    "q other day" (08/08/2013)  . Heart murmur   . Hemorrhoid thrombosis   . History of echocardiogram    Echo 1/17:  Severe LVH, EF 55-60%, no RWMA, Gr 2 DD, AVR ok, mild to mod MR, mild LAE, mild reduced RVSF, mod RAE  . History of medication noncompliance   . HYPERLIPIDEMIA   . Hypertension    a. Hx of HTN urgency secondary to noncompliance. b. urinary metanephrine and catecholeamine levels normal 2013.  c. Renal art [name] 1/16:  No evidence of renal artery stenosis noted bilaterally.  . INGUINAL HERNIA   . Pneumonia ~ 2013  . Renal insufficiency   . Serrated adenoma of colon 08/2015  . Stroke ([place])   . Tobacco abuse   . Valvular heart disease    a. Echo 05/2011: moderate to severe eccentric MR and mild to moderate AI with prolapsing left coronary cusp. b. Echo 01/2012: mild-mod AI, mild dilitation of aortic root, mild MR.;  c. Echo 1/16: Severe LVH consistent with hypertrophic cardio myopathy, EF 50%, no RWMA, mod AI, mild MR, mild RAE, dilated Ao root (40 mm);     Past Surgical History:  Procedure Laterality Date  . ANKLE SURGERY Bilateral    Fractures bilaterally  . AORTIC VALVE SURGERY  09/2014  . CORONARY ANGIOGRAPHY N/A 12/06/2017   Procedure: CORONARY ANGIOGRAPHY;  Surgeon: Belva Crome, MD;  Location: [place] [facility];  Service: Cardiovascular;  Laterality: N/A;  . CORONARY ANGIOGRAPHY N/A 09/26/2018   Procedure: CORONARY ANGIOGRAPHY;  Surgeon: Nelva Bush, MD;  Location: [place] [facility];  Service: Cardiovascular;  Laterality: N/A;  . CORONARY STENT INTERVENTION N/A  12/10/2017   Procedure: CORONARY STENT INTERVENTION;  Surgeon: [name] Sine, MD;  Location: [place] [facility];  Service: Cardiovascular;  Laterality: N/A;  . FOOT FRACTURE SURGERY Bilateral 2004-2010   "got pins in both of them"  . HEMORRHOID SURGERY N/A 06/15/2015   Procedure: HEMORRHOIDECTOMY;  Surgeon: Stark Klein, MD;  Location: [place];  Service: General;  Laterality: N/A;  . INGUINAL HERNIA REPAIR Right ~ 1996  . LEFT HEART CATHETERIZATION WITH CORONARY ANGIOGRAM N/A 06/21/2014   Procedure: LEFT HEART CATHETERIZATION WITH CORONARY ANGIOGRAM;  Surgeon: Larey Dresser, MD;  Location: [facility] CATH LAB;  Service: Cardiovascular;  Laterality: N/A;  . LOOP RECORDER INSERTION N/A 06/19/2016   Procedure: Loop Recorder Insertion;  Surgeon: Deboraha Sprang,  MD;  Location: [place] [facility];  Service: Cardiovascular;  Laterality: N/A;  . TEE WITHOUT CARDIOVERSION N/A 03/12/2016   Procedure: TRANSESOPHAGEAL ECHOCARDIOGRAM (TEE);  Surgeon: Sanda Klein, MD;  Location: [facility] ENDOSCOPY;  Service: Cardiovascular;  Laterality: N/A;    Current Medications: Current Meds  Medication Sig  . acetaminophen (TYLENOL) 325 MG tablet Take 650 mg by mouth every 6 (six) hours as needed (for pain or headaches).   [name] albuterol (VENTOLIN HFA) 108 (90 Base) MCG/ACT inhaler INHALE 2 PUFFS INTO THE LUNGS EVERY 6 HOURS AS NEEDED FOR WHEEZING OR SHORTNESS OF BREATH  . amitriptyline (ELAVIL) 50 MG tablet TAKE 1 TABLET(50 MG) BY MOUTH AT BEDTIME  . aspirin EC 81 MG EC tablet Take 1 tablet (81 mg total) by mouth daily.  . Blood Pressure Monitor KIT 1 Device by Does not apply route 2 (two) times daily.  . cloNIDine (CATAPRES - DOSED IN MG/24 HR) 0.3 mg/24hr patch Place 1 patch (0.3 mg total) onto the skin once a week.  . Evolocumab (REPATHA SURECLICK) 952 MG/ML SOAJ Inject 1 Dose into the skin every 14 (fourteen) days.  . minoxidil (LONITEN) 2.5 MG tablet daily.  . pantoprazole (PROTONIX) 40 MG tablet TAKE 1 TABLET(40 MG) BY MOUTH  DAILY  . STIOLTO RESPIMAT 2.5-2.5 MCG/ACT AERS INHALE 2 PUFFS INTO THE LUNGS DAILY  . [DISCONTINUED] furosemide (LASIX) 40 MG tablet Take one tablet daily as needed for shortness of breath or weight gain of more than 3 lbs in one day. (Patient taking differently: Take 40 mg by mouth daily as needed (shortness of breath or weight gain of more than 3 lbs in one day). )  . [DISCONTINUED] spironolactone (ALDACTONE) 25 MG tablet daily.     Allergies:   Imdur [isosorbide nitrate] and Tramadol   Social History   Socioeconomic History  . Marital status: Married    Spouse name: Not on file  . Number of children: 5  . Years of education: 74  . Highest education level: Not on file  Occupational History  . Occupation: Disabled, part-time Programmer, systems    Comment: Disability  Tobacco Use  . Smoking status: Current Some Day Smoker    Packs/day: 0.25    Years: 23.00    Pack years: 5.75    Types: Cigarettes  . Smokeless tobacco: Never Used  . Tobacco comment: 3 to 4 per day  Vaping Use  . Vaping Use: Never assessed  Substance and Sexual Activity  . Alcohol use: Yes    Alcohol/week: 1.0 standard drink    Types: 1 Cans of beer per week    Comment: occasionally  . Drug use: Yes    Types: Marijuana    Comment: 2 times/week  . Sexual activity: Yes  Other Topics Concern  . Not on file  Social History Narrative   Fun: Enjoy his children   Social Determinants of Health   Financial Resource Strain:   . Difficulty of Paying Living Expenses: Not on file  Food Insecurity:   . Worried About Charity fundraiser in the Last Year: Not on file  . Ran Out of Food in the Last Year: Not on file  Transportation Needs:   . Lack of Transportation (Medical): Not on file  . Lack of Transportation (Non-Medical): Not on file  Physical Activity:   . Days of Exercise per Week: Not on file  . Minutes of Exercise per Session: Not on file  Stress:   . Feeling of Stress : Not on  file  Social Connections:    . Frequency of Communication with Friends and Family: Not on file  . Frequency of Social Gatherings with Friends and Family: Not on file  . Attends Religious Services: Not on file  . Active Member of Clubs or Organizations: Not on file  . Attends Archivist Meetings: Not on file  . Marital Status: Not on file     Family History: The patient's family history includes Colon cancer in his paternal uncle; Diabetes in his maternal aunt; Headache in his sister; Heart attack in his brother; Hypertension in his brother, mother, and another family member; Lung cancer in his maternal uncle; Other in his sister; Stroke in his maternal aunt and maternal uncle; Thyroid disease in his sister. There is no history of Throat cancer, Pancreatic cancer, Esophageal cancer, Kidney disease, or Liver disease.  ROS:   Please see the history of present illness.    All other systems reviewed and are negative.  EKGs/Labs/Other Studies Reviewed:    EKG:  EKG is ordered today.  The ekg ordered today demonstrates sinus rhythm.  Rate 63 bpm.  LVH with repolarization abnormality  Recent Labs: 11/02/2019: ALT 9; BUN 27; Creatinine, Ser 3.27; Potassium 5.0; Sodium 140; TSH 1.680   Recent Lipid Panel    Component Value Date/Time   CHOL 143 10/09/2019 1029   TRIG 106 10/09/2019 1029   HDL 58 10/09/2019 1029   CHOLHDL 2.5 10/09/2019 1029   CHOLHDL 3.1 07/27/2018 1145   VLDL 21 07/27/2018 1145   LDLCALC 66 10/09/2019 1029    Physical Exam:    VS:  BP (!) 182/100   Pulse 63   Ht '5\' 11"'  (1.803 m)   Wt 153 lb (69.4 kg)   SpO2 99%   BMI 21.34 kg/m  , BMI Body mass index is 21.34 kg/m. GENERAL:  Well appearing HEENT: Pupils equal round and reactive, fundi not visualized, oral mucosa unremarkable NECK:  No jugular venous distention, waveform within normal limits, carotid upstroke brisk and symmetric, no bruits, no thyromegaly LYMPHATICS:  No cervical adenopathy LUNGS:  Clear to auscultation  bilaterally HEART:  RRR.  PMI not displaced or sustained,S1 and S2 within normal limits, no S3, no S4, no clicks, no rubs, III/VI systolic murmursABD:  Flat, positive bowel sounds normal in frequency in pitch, no bruits, no rebound, no guarding, no midline pulsatile mass, no hepatomegaly, no splenomegaly EXT:  2 plus pulses throughout, no edema, no cyanosis no clubbing SKIN:  No rashes no nodules NEURO:  Cranial nerves II through XII grossly intact, motor grossly intact throughout PSYCH:  Cognitively intact, oriented to person place and time   ASSESSMENT:    1. Uncontrolled hypertension   2. Snoring   3. Daytime somnolence   4. Coronary artery disease involving native coronary artery of native heart with angina pectoris ([place])   5. History of aortic dissection   6. Hypertensive cardiomyopathy, without heart failure ([place]): EF reduced to 35-40% from 55%.   7. Malignant hypertension     PLAN:    # Essential hypertension:  Mr. Brizzi BP is very poorly controlled here and at home.  He doesn't like taking his medications all at once.  There are several medications that he reports taking but when the pharmacy was called they have not been filled recently.  Carvedilol was last filled 11/2018.   They had no record of spironolactone and several others were out of date, though were filled more recently.  He reports that this  is because he had many old pills at home.  This makes me hesitant to increase or add more medication.  We discussed the importance of taking medication as prescribed or communicating to [name] if something is not affordable or causing side effects.  We will simplify his regimen and asked that he take his medications at the same time daily.  He will take amlodipine and carvedilol at 8 a.m.  He will take his minoxidil at noon.  He will then take carvedilol and doxazosin at 8 PM.  I suggested that he can take the lisinopril at the same time.  If he would like to keep taking that and night  before he goes to bed that would be okay.  He has difficulty with nocturia so doxazosin was added to his regimen.  Continue his clonidine patch once a week.  We will check a TSH and CMP.  In 2013 he was found to have an elevated aldosterone to rennin ratio of 50.  His aldosterone level at that time was 6.  He had a CT of the abdomen and 09/2018 that revealed a tortuous graft to the left renal artery, though it was patent.  The right renal artery was normal.  He will check his blood pressures twice daily and bring this to follow-up.  Secondary Causes of Hypertension  Medications/Herbal: OCP, steroids, stimulants, antidepressants, weight loss medication, immune suppressants, NSAIDs, sympathomimetics, alcohol, caffeine, licorice, ginseng, St. John's wort, chemo (no issues identified)  Sleep Apnea: Refer for sleep study due to snoring and apnea Renal artery stenosis- patent on CT-A Hyperaldosteronism- ratio eelvated but aldosterone is normal Hyper/hypothyroidism- check TSH Pheochromocytoma: previously tested Cushing's syndrome: (testing not indicated)  Coarctation of the aorta: negative on CT-A  Disposition:    FU with MD/PharmD in 1 month    Medication Adjustments/Labs and Tests Ordered: Current medicines are reviewed at length with the patient today.  Concerns regarding medicines are outlined above.  Orders Placed This Encounter  Procedures  . TSH  . Comprehensive metabolic panel  . EKG 12-Lead  . Split night study   Meds ordered this encounter  Medications  . doxazosin (CARDURA) 4 MG tablet    Sig: Take 1 tablet (4 mg total) by mouth at bedtime.    Dispense:  90 tablet    Refill:  1     Signed, Skeet Latch, MD  11/03/2019 5:29 PM    [facility]

## 2019-11-03 ENCOUNTER — Encounter: Payer: Self-pay | Admitting: Cardiovascular Disease

## 2019-11-08 ENCOUNTER — Telehealth: Payer: Self-pay | Admitting: *Deleted

## 2019-11-08 MED ORDER — DOXAZOSIN MESYLATE 8 MG PO TABS: 8.0000 mg | ORAL_TABLET | Freq: Every day | ORAL | 1 refills | Status: DC

## 2019-11-08 NOTE — Telephone Encounter (Signed)
-----   Message from Skeet Latch, MD sent at 11/07/2019  4:18 PM EDT ----- Thyroid normal.  Kidney function is getting worse.  This is probably mostly due to his high BP.  But we need to make sure he doesn't take any meds that can be harmful to the kidneys.  He was taking lisinopril but I don't see it on his list.  Stop taking it if he still is.  He needs to see nephrology.  If his BP is still high, recommend he increase doxazosin to 8 mg.

## 2019-11-08 NOTE — Telephone Encounter (Signed)
Spoke with patient, he is taking Lisinopril. Advised to stop Advised to increase Doxazosin to 8 mg HS  Has follow up with Dr Joelyn Oms at [facility] Patient verbalized understanding

## 2019-11-13 ENCOUNTER — Telehealth: Payer: Self-pay

## 2019-11-13 NOTE — Telephone Encounter (Signed)
[employer] PA Approved Auth # [id number] Approval Date 11/13/19 - 12/13/19 OK to schedule  Send to Barry Brunner to schedule

## 2019-11-17 ENCOUNTER — Telehealth: Payer: Self-pay | Admitting: *Deleted

## 2019-11-17 DIAGNOSIS — R400 Somnolence: Secondary | ICD-10-CM

## 2019-11-17 NOTE — Telephone Encounter (Signed)
Patient is scheduled for lab study on 12/18/19. Patient understands his sleep study will be done at [facility] sleep lab. Patient understands he will receive a sleep packet in a week or so. Patient understands to call if he does not receive the sleep packet in a timely manner. Patient agrees with treatment and thanked me for call.

## 2019-11-17 NOTE — Telephone Encounter (Signed)
From: Bobby Rumpf, CMA  Sent: 11/13/2019  1:17 PM EDT  To: Lauralee Evener, CMA, Earvin Hansen, LPN  Subject: Split NIght                    Pt has Medicaid  PA not required.  Ok to schedule Split Night

## 2019-11-24 ENCOUNTER — Inpatient Hospital Stay (HOSPITAL_COMMUNITY)
Admission: EM | Admit: 2019-11-24 | Discharge: 2019-11-26 | DRG: 303 | Disposition: A | Discharge status: Home / Self Care | Payer: Medicare HMO | Attending: Internal Medicine | Admitting: Internal Medicine

## 2019-11-24 ENCOUNTER — Emergency Department (HOSPITAL_COMMUNITY): Payer: Medicare HMO

## 2019-11-24 ENCOUNTER — Encounter (HOSPITAL_COMMUNITY): Payer: Self-pay

## 2019-11-24 DIAGNOSIS — Z86010 Personal history of colonic polyps: Secondary | ICD-10-CM

## 2019-11-24 DIAGNOSIS — J449 Chronic obstructive pulmonary disease, unspecified: Secondary | ICD-10-CM | POA: Diagnosis present

## 2019-11-24 DIAGNOSIS — I2510 Atherosclerotic heart disease of native coronary artery without angina pectoris: Secondary | ICD-10-CM | POA: Diagnosis not present

## 2019-11-24 DIAGNOSIS — I10 Essential (primary) hypertension: Secondary | ICD-10-CM | POA: Diagnosis not present

## 2019-11-24 DIAGNOSIS — F17210 Nicotine dependence, cigarettes, uncomplicated: Secondary | ICD-10-CM | POA: Diagnosis present

## 2019-11-24 DIAGNOSIS — I429 Cardiomyopathy, unspecified: Secondary | ICD-10-CM | POA: Diagnosis not present

## 2019-11-24 DIAGNOSIS — N179 Acute kidney failure, unspecified: Secondary | ICD-10-CM | POA: Diagnosis not present

## 2019-11-24 DIAGNOSIS — Z7982 Long term (current) use of aspirin: Secondary | ICD-10-CM

## 2019-11-24 DIAGNOSIS — Z8673 Personal history of transient ischemic attack (TIA), and cerebral infarction without residual deficits: Secondary | ICD-10-CM

## 2019-11-24 DIAGNOSIS — Z823 Family history of stroke: Secondary | ICD-10-CM

## 2019-11-24 DIAGNOSIS — I160 Hypertensive urgency: Secondary | ICD-10-CM | POA: Diagnosis present

## 2019-11-24 DIAGNOSIS — R011 Cardiac murmur, unspecified: Secondary | ICD-10-CM | POA: Diagnosis present

## 2019-11-24 DIAGNOSIS — R109 Unspecified abdominal pain: Secondary | ICD-10-CM | POA: Diagnosis present

## 2019-11-24 DIAGNOSIS — Z8249 Family history of ischemic heart disease and other diseases of the circulatory system: Secondary | ICD-10-CM

## 2019-11-24 DIAGNOSIS — I7100 Dissection of unspecified site of aorta: Secondary | ICD-10-CM | POA: Diagnosis present

## 2019-11-24 DIAGNOSIS — Z9889 Other specified postprocedural states: Secondary | ICD-10-CM

## 2019-11-24 DIAGNOSIS — I130 Hypertensive heart and chronic kidney disease with heart failure and stage 1 through stage 4 chronic kidney disease, or unspecified chronic kidney disease: Secondary | ICD-10-CM | POA: Diagnosis present

## 2019-11-24 DIAGNOSIS — J329 Chronic sinusitis, unspecified: Secondary | ICD-10-CM | POA: Diagnosis present

## 2019-11-24 DIAGNOSIS — N189 Chronic kidney disease, unspecified: Secondary | ICD-10-CM | POA: Diagnosis not present

## 2019-11-24 DIAGNOSIS — R079 Chest pain, unspecified: Secondary | ICD-10-CM | POA: Diagnosis not present

## 2019-11-24 DIAGNOSIS — M5136 Other intervertebral disc degeneration, lumbar region: Secondary | ICD-10-CM | POA: Diagnosis present

## 2019-11-24 DIAGNOSIS — F329 Major depressive disorder, single episode, unspecified: Secondary | ICD-10-CM | POA: Diagnosis present

## 2019-11-24 DIAGNOSIS — E785 Hyperlipidemia, unspecified: Secondary | ICD-10-CM | POA: Diagnosis present

## 2019-11-24 DIAGNOSIS — K219 Gastro-esophageal reflux disease without esophagitis: Secondary | ICD-10-CM | POA: Diagnosis present

## 2019-11-24 DIAGNOSIS — F419 Anxiety disorder, unspecified: Secondary | ICD-10-CM | POA: Diagnosis present

## 2019-11-24 DIAGNOSIS — Z20822 Contact with and (suspected) exposure to covid-19: Secondary | ICD-10-CM | POA: Diagnosis present

## 2019-11-24 DIAGNOSIS — Z888 Allergy status to other drugs, medicaments and biological substances status: Secondary | ICD-10-CM

## 2019-11-24 DIAGNOSIS — N184 Chronic kidney disease, stage 4 (severe): Secondary | ICD-10-CM | POA: Diagnosis not present

## 2019-11-24 DIAGNOSIS — G8929 Other chronic pain: Secondary | ICD-10-CM | POA: Diagnosis present

## 2019-11-24 DIAGNOSIS — I252 Old myocardial infarction: Secondary | ICD-10-CM

## 2019-11-24 DIAGNOSIS — D696 Thrombocytopenia, unspecified: Secondary | ICD-10-CM | POA: Diagnosis present

## 2019-11-24 DIAGNOSIS — Z955 Presence of coronary angioplasty implant and graft: Secondary | ICD-10-CM

## 2019-11-24 DIAGNOSIS — I5042 Chronic combined systolic (congestive) and diastolic (congestive) heart failure: Secondary | ICD-10-CM | POA: Diagnosis present

## 2019-11-24 DIAGNOSIS — Z79899 Other long term (current) drug therapy: Secondary | ICD-10-CM

## 2019-11-24 LAB — BASIC METABOLIC PANEL
Anion gap: 11 (ref 5–15)
BUN: 26 mg/dL — ABNORMAL HIGH (ref 6–20)
CO2: 22 mmol/L (ref 22–32)
Calcium: 9.3 mg/dL (ref 8.9–10.3)
Chloride: 108 mmol/L (ref 98–111)
Creatinine, Ser: 3.09 mg/dL — ABNORMAL HIGH (ref 0.61–1.24)
GFR calc Af Amer: 26 mL/min — ABNORMAL LOW (ref >60)
GFR calc non Af Amer: 23 mL/min — ABNORMAL LOW (ref >60)
Glucose, Bld: 96 mg/dL (ref 70–99)
Potassium: 3.9 mmol/L (ref 3.5–5.1)
Sodium: 141 mmol/L (ref 135–145)

## 2019-11-24 LAB — CBC
HCT: 42.6 % (ref 39.0–52.0)
Hemoglobin: 13.5 g/dL (ref 13.0–17.0)
MCH: 31.2 pg (ref 26.0–34.0)
MCHC: 31.7 g/dL (ref 30.0–36.0)
MCV: 98.4 fL (ref 80.0–100.0)
Platelets: 115 10*3/uL — ABNORMAL LOW (ref 150–400)
RBC: 4.33 MIL/uL (ref 4.22–5.81)
RDW: 13.2 % (ref 11.5–15.5)
WBC: 5.4 10*3/uL (ref 4.0–10.5)
nRBC: 0 % (ref 0.0–0.2)

## 2019-11-24 LAB — TROPONIN I (HIGH SENSITIVITY)
Troponin I (High Sensitivity): 59 ng/L — ABNORMAL HIGH (ref <18)
Troponin I (High Sensitivity): 88 ng/L — ABNORMAL HIGH (ref <18)
Troponin I (High Sensitivity): 87 ng/L — ABNORMAL HIGH (ref <18)

## 2019-11-24 LAB — SARS CORONAVIRUS 2 BY RT PCR (HOSPITAL ORDER, PERFORMED IN ~~LOC~~ HOSPITAL LAB): SARS Coronavirus 2: NEGATIVE

## 2019-11-24 MED ORDER — DOXAZOSIN MESYLATE 2 MG PO TABS
8.0000 mg | ORAL_TABLET | Freq: Every day | ORAL | Status: DC
  Administered 2019-11-25 (×2): 8 mg via ORAL
  Filled 2019-11-24: qty 4
  Filled 2019-11-24: qty 1
  Filled 2019-11-24: qty 4

## 2019-11-24 MED ORDER — CLONIDINE HCL 0.3 MG/24HR TD PTWK
0.3000 mg | MEDICATED_PATCH | TRANSDERMAL | Status: DC
  Administered 2019-11-26: 0.3 mg via TRANSDERMAL
  Filled 2019-11-24: qty 1

## 2019-11-24 MED ORDER — SODIUM CHLORIDE 0.9 % IV BOLUS
500.0000 mL | Freq: Once | INTRAVENOUS | Status: AC
  Administered 2019-11-24: 500 mL via INTRAVENOUS

## 2019-11-24 MED ORDER — MINOXIDIL 10 MG PO TABS
10.0000 mg | ORAL_TABLET | Freq: Two times a day (BID) | ORAL | Status: DC
  Administered 2019-11-24 – 2019-11-26 (×3): 10 mg via ORAL
  Filled 2019-11-24 (×5): qty 1

## 2019-11-24 MED ORDER — IOHEXOL 350 MG/ML SOLN
100.0000 mL | Freq: Once | INTRAVENOUS | Status: AC | PRN
  Administered 2019-11-24: 100 mL via INTRAVENOUS

## 2019-11-24 MED ORDER — ASPIRIN 81 MG PO CHEW
324.0000 mg | CHEWABLE_TABLET | Freq: Once | ORAL | Status: AC
  Administered 2019-11-24: 324 mg via ORAL
  Filled 2019-11-24: qty 4

## 2019-11-24 MED ORDER — UMECLIDINIUM BROMIDE 62.5 MCG/INH IN AEPB
1.0000 | INHALATION_SPRAY | Freq: Every day | RESPIRATORY_TRACT | Status: DC
  Administered 2019-11-26: 09:00:00 1 via RESPIRATORY_TRACT
  Filled 2019-11-24: qty 7

## 2019-11-24 MED ORDER — PANTOPRAZOLE SODIUM 40 MG PO TBEC
40.0000 mg | DELAYED_RELEASE_TABLET | Freq: Every day | ORAL | Status: DC
  Administered 2019-11-25 – 2019-11-26 (×2): 40 mg via ORAL
  Filled 2019-11-24 (×2): qty 1

## 2019-11-24 MED ORDER — ARFORMOTEROL TARTRATE 15 MCG/2ML IN NEBU
15.0000 ug | INHALATION_SOLUTION | Freq: Two times a day (BID) | RESPIRATORY_TRACT | Status: DC
  Administered 2019-11-26: 15 ug via RESPIRATORY_TRACT
  Filled 2019-11-24 (×4): qty 2

## 2019-11-24 MED ORDER — HEPARIN SODIUM (PORCINE) 5000 UNIT/ML IJ SOLN
5000.0000 [IU] | Freq: Three times a day (TID) | INTRAMUSCULAR | Status: DC
  Administered 2019-11-24 – 2019-11-25 (×4): 5000 [IU] via SUBCUTANEOUS
  Filled 2019-11-24 (×5): qty 1

## 2019-11-24 MED ORDER — ALBUTEROL SULFATE (2.5 MG/3ML) 0.083% IN NEBU: 3.0000 mL | INHALATION_SOLUTION | Freq: Four times a day (QID) | RESPIRATORY_TRACT | Status: DC | PRN

## 2019-11-24 MED ORDER — SODIUM CHLORIDE 0.9 % IV BOLUS: 1000.0000 mL | Freq: Once | INTRAVENOUS | Status: DC

## 2019-11-24 MED ORDER — CARVEDILOL 25 MG PO TABS
25.0000 mg | ORAL_TABLET | Freq: Two times a day (BID) | ORAL | Status: DC
  Administered 2019-11-25 – 2019-11-26 (×2): 25 mg via ORAL
  Filled 2019-11-24 (×2): qty 1

## 2019-11-24 MED ORDER — SODIUM CHLORIDE 0.9% FLUSH: 3.0000 mL | INTRAVENOUS | Status: DC | PRN

## 2019-11-24 MED ORDER — AMLODIPINE BESYLATE 10 MG PO TABS
10.0000 mg | ORAL_TABLET | Freq: Every day | ORAL | Status: DC
  Administered 2019-11-25 – 2019-11-26 (×2): 10 mg via ORAL
  Filled 2019-11-24 (×2): qty 1

## 2019-11-24 MED ORDER — SODIUM CHLORIDE 0.9% FLUSH
3.0000 mL | Freq: Two times a day (BID) | INTRAVENOUS | Status: DC
  Administered 2019-11-25 (×2): 3 mL via INTRAVENOUS

## 2019-11-24 MED ORDER — ONDANSETRON HCL 4 MG/2ML IJ SOLN: 4.0000 mg | Freq: Four times a day (QID) | INTRAMUSCULAR | Status: DC | PRN

## 2019-11-24 MED ORDER — AMITRIPTYLINE HCL 25 MG PO TABS
50.0000 mg | ORAL_TABLET | Freq: Every day | ORAL | Status: DC
  Administered 2019-11-24 – 2019-11-25 (×2): 50 mg via ORAL
  Filled 2019-11-24 (×4): qty 2

## 2019-11-24 MED ORDER — CARVEDILOL 3.125 MG PO TABS: 25.0000 mg | ORAL_TABLET | Freq: Two times a day (BID) | ORAL | Status: DC

## 2019-11-24 MED ORDER — ACETAMINOPHEN 325 MG PO TABS
650.0000 mg | ORAL_TABLET | ORAL | Status: DC | PRN
  Administered 2019-11-25: 650 mg via ORAL
  Filled 2019-11-24 (×2): qty 2

## 2019-11-24 MED ORDER — ALUM & MAG HYDROXIDE-SIMETH 200-200-20 MG/5ML PO SUSP
30.0000 mL | Freq: Once | ORAL | Status: AC
  Administered 2019-11-24: 30 mL via ORAL
  Filled 2019-11-24: qty 30

## 2019-11-24 MED ORDER — SODIUM CHLORIDE 0.9 % IV SOLN: 250.0000 mL | INTRAVENOUS | Status: DC | PRN

## 2019-11-24 MED ORDER — ASPIRIN EC 81 MG PO TBEC
81.0000 mg | DELAYED_RELEASE_TABLET | Freq: Every day | ORAL | Status: DC
  Administered 2019-11-25 – 2019-11-26 (×2): 81 mg via ORAL
  Filled 2019-11-24 (×2): qty 1

## 2019-11-24 MED ORDER — ACETAMINOPHEN 325 MG PO TABS
650.0000 mg | ORAL_TABLET | Freq: Once | ORAL | Status: AC
  Administered 2019-11-24: 650 mg via ORAL
  Filled 2019-11-24: qty 2

## 2019-11-24 MED ORDER — FENTANYL CITRATE (PF) 100 MCG/2ML IJ SOLN
50.0000 ug | Freq: Once | INTRAMUSCULAR | Status: AC
  Administered 2019-11-24: 50 ug via INTRAVENOUS
  Filled 2019-11-24: qty 2

## 2019-11-24 MED ORDER — SODIUM CHLORIDE 0.9 % IV SOLN: INTRAVENOUS | Status: DC

## 2019-11-24 MED ORDER — ROSUVASTATIN CALCIUM 20 MG PO TABS
40.0000 mg | ORAL_TABLET | Freq: Every day | ORAL | Status: DC
  Administered 2019-11-25 – 2019-11-26 (×2): 40 mg via ORAL
  Filled 2019-11-24 (×3): qty 2

## 2019-11-24 NOTE — Discharge Instructions (Addendum)
You will need to have your kidney function checked within 1-2 days given you received IV contrast today with known kidney dysfunction. Please drink plenty of water in the meantime.

## 2019-11-24 NOTE — ED Triage Notes (Signed)
Emergency Medicine Provider Triage Evaluation Note  [name] , a 49 y.o. male  was evaluated in triage.  Pt complains of sudden onset tearing left-sided chest pains since Monday.  Radiates into the abdomen.  Notes associated left-sided headache which worsens with certain movements.  Denies nausea, vomiting, shortness of breath or fevers.  States the pain feels similar to what he experienced when he had a dissection which was repaired at [facility] years ago.  He smokes 4 cigarettes daily at this time, drinks occasionally, smokes marijuana.  Has not tried anything for his symptoms.  Reports compliance with medications.  Review of Systems  Positive: Chest pain, abdominal pain, headache Negative: Fevers, syncope, shortness of breath  Physical Exam  BP (!) 177/102 (BP Location: Right Arm)   Pulse (!) 135   Temp 99.2 F (37.3 C) (Oral)   Resp 18   SpO2 99%  Gen:   Awake, appears anxious HEENT:  Atraumatic  Resp:  Normal effort  Cardiac:  Tachycardic, murmur noted, bounding peripheral pulses noted, no lower extremity edema Abd:   Nondistended, mild left-sided abdominal tenderness noted.  Surgical scars noted.  No pulsating mass MSK:   Moves extremities without difficulty  Neuro:  Speech clear and goal oriented, moves all extremities spontaneously without difficulty.  Medical Decision Making  Medically screening exam initiated at 1:10 PM.  Appropriate orders placed.  [name] was informed that the remainder of the evaluation will be completed by another provider, this initial triage assessment does not replace that evaluation, and the importance of remaining in the ED until their evaluation is complete.  Clinical Impression  History of aortic dissection with aortic arch replacement and endovascular repair of the descending aorta, resistant hypertension presenting with tearing left-sided chest pain extending to the abdomen.  Good peripheral pulses.  Symptoms present for several days.   He is tachycardic and hypertensive.  Will obtain lab work, EKG, and order dissection study as he has not had imaging since July 2020.  EKG shows prolonged QT interval.  Discussed with charge nurse [place] that patient will require next available room for assessment.    Renita Papa, PA-C 11/24/19 1313

## 2019-11-24 NOTE — ED Notes (Signed)
[name] wife [phone] would like an update on the pt

## 2019-11-24 NOTE — H&P (Signed)
History and Physical    [name] Ewing:[id number] DOB: 03-06-1971 DOA: 11/24/2019  PCP: Kerin Perna, NP   Patient coming from: Home   Chief Complaint: Chest and abdominal pain  HPI: [name] is a 48 y.o. 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  Patient reports intermittent chest pain since 11/20/2019, severe at times, tearing in character, and radiating from his left chest into his abdomen and back.  Patient is concerned that the symptoms are similar to what he was experiencing when his aorta dissected.  Symptoms seem to be worse with activity and better with rest.  He denies any fevers, cough, or leg swelling.  ED Course: Upon arrival to the ED, patient is found to be afebrile, saturating well on room air, and with blood pressure 180/100.  EKG features a sinus rhythm with LVH and repolarization abnormality.  Chest x-rays negative for acute cardiopulmonary disease.  CTA dissection study is negative for acute intra-abdominal or pelvic abnormality and notable for stable appearance of complex repair of ascending abdominal aorta without acute complication.  Chemistry panel features a creatinine of 3.09, down from 3.27 last month but up from 2.2 in July.  CBC features a thrombocytopenia with platelets 115,000, down from 132,000 in July.  High-sensitivity troponin is gone from 59-80.  Patient was given 500 cc of saline, fentanyl, acetaminophen, GI cocktail, and 324 mg of aspirin in the ED.  Cardiology was consulted by the ED physician and has recommended medical admission.  COVID-19 screening test not yet resulted.  Review of Systems:  All other systems  reviewed and apart from HPI, are negative.  Past Medical History:  Diagnosis Date  . Adenomatous colon polyp 08/2015  . Anxiety   . Aortic disease ([place])   . Aortic dissection ([facility])    a. admx 04/2014 >> L renal infarct; a/c renal failure >> b.  s/p Bioprosthetic Bentall and total arch replacement and staged endovascular repair of descending aortic aneurysm ([facility] - Dr. [name])  . CAD (coronary artery disease)    a. LHC 4/16:  oD1 60%  . Cardiomyopathy ([place])    a. non-ischemic - probably related to untreated HTN and ETOH abuse - Echo 3/13 with EF 35-40% >> b. Echo 4/16: Severe LVH, EF 55-60%, moderate AI, moderate MR, mild LAE, trivial effusion, known type B dissection with communication between true and false lumens with suprasternal images suggesting dissection plane may propagate to at least left subclavian takeoff, root above aortic valve okay    . Chronic abdominal pain   . Chronic combined systolic and diastolic congestive heart failure ([place])    a. 05/2011: Adm with pulm edema/HTN urgency, EF 35-40% with diffuse hypokinesis and moderate to severe mitral regurgitation. Cardiomyopathy likely due to uncontrolled HTN and ETOH abuse - cath deferred due to renal insufficiency (felt due to uncontrolled HTN). bJodie Echevaria MV 06/2011: EF 37% and no ischemia or infarction. c. EF 45-50% by echo 01/2012.  [name] Chronic sinusitis   . CKD (chronic kidney disease)    a. 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  peak creatinine 3.46 during admx for aortic dissection 2/16.  sees Dr [name]  . DDD (degenerative disc disease), lumbar   .  Depression   . Descending thoracic aortic aneurysm ([place])   . Dissecting aneurysm of thoracic aorta ([place])   . ETOH abuse    a. Reported to have quit 05/2011.  . Frequent headaches   . GERD (gastroesophageal reflux disease)   . Headache(784.0)    "q other day" (08/08/2013)  . Heart murmur   . Hemorrhoid thrombosis   . History of echocardiogram    Echo 1/17:  Severe LVH, EF 55-60%, no  RWMA, Gr 2 DD, AVR ok, mild to mod MR, mild LAE, mild reduced RVSF, mod RAE  . History of medication noncompliance   . HYPERLIPIDEMIA   . Hypertension    a. Hx of HTN urgency secondary to noncompliance. b. urinary metanephrine and catecholeamine levels normal 2013.  c. Renal art [name] 1/16:  No evidence of renal artery stenosis noted bilaterally.  . INGUINAL HERNIA   . Pneumonia ~ 2013  . Renal insufficiency   . Serrated adenoma of colon 08/2015  . Stroke ([place])   . Tobacco abuse   . Valvular heart disease    a. Echo 05/2011: moderate to severe eccentric MR and mild to moderate AI with prolapsing left coronary cusp. b. Echo 01/2012: mild-mod AI, mild dilitation of aortic root, mild MR.;  c. Echo 1/16: Severe LVH consistent with hypertrophic cardio myopathy, EF 50%, no RWMA, mod AI, mild MR, mild RAE, dilated Ao root (40 mm);     Past Surgical History:  Procedure Laterality Date  . ANKLE SURGERY Bilateral    Fractures bilaterally  . AORTIC VALVE SURGERY  09/2014  . CORONARY ANGIOGRAPHY N/A 12/06/2017   Procedure: CORONARY ANGIOGRAPHY;  Surgeon: Belva Crome, MD;  Location: [place] [facility];  Service: Cardiovascular;  Laterality: N/A;  . CORONARY ANGIOGRAPHY N/A 09/26/2018   Procedure: CORONARY ANGIOGRAPHY;  Surgeon: Nelva Bush, MD;  Location: [place] [facility];  Service: Cardiovascular;  Laterality: N/A;  . CORONARY STENT INTERVENTION N/A 12/10/2017   Procedure: CORONARY STENT INTERVENTION;  Surgeon: [name] Sine, MD;  Location: [place] [facility];  Service: Cardiovascular;  Laterality: N/A;  . FOOT FRACTURE SURGERY Bilateral 2004-2010   "got pins in both of them"  . HEMORRHOID SURGERY N/A 06/15/2015   Procedure: HEMORRHOIDECTOMY;  Surgeon: Stark Klein, MD;  Location: [facility];  Service: General;  Laterality: N/A;  . INGUINAL HERNIA REPAIR Right ~ 1996  . LEFT HEART CATHETERIZATION WITH CORONARY ANGIOGRAM N/A 06/21/2014   Procedure: LEFT HEART CATHETERIZATION WITH CORONARY ANGIOGRAM;   Surgeon: Larey Dresser, MD;  Location: [facility] C[facility] LAB;  Service: Cardiovascular;  Laterality: N/A;  . LOOP RECORDER INSERTION N/A 06/19/2016   Procedure: Loop Recorder Insertion;  Surgeon: Deboraha Sprang, MD;  Location: [place] [facility];  Service: Cardiovascular;  Laterality: N/A;  . TEE WITHOUT CARDIOVERSION N/A 03/12/2016   Procedure: TRANSESOPHAGEAL ECHOCARDIOGRAM (TEE);  Surgeon: Sanda Klein, MD;  Location: [facility] ENDOSCOPY;  Service: Cardiovascular;  Laterality: N/A;    Social History:   reports that he has been smoking cigarettes. He has a 5.75 pack-year smoking history. He has never used smokeless tobacco. He reports current alcohol use of about 1.0 standard drink of alcohol per week. He reports current drug use. Drug: Marijuana.  Allergies  Allergen Reactions  . Imdur [Isosorbide Nitrate] Other (See Comments)    Headaches   . Tramadol Other (See Comments)    Headaches     Family History  Problem Relation Age of Onset  . Hypertension Mother   . Hypertension Other   .  Colon cancer Paternal Uncle   . Stroke Maternal Aunt   . Heart attack Brother   . Hypertension Brother   . Diabetes Maternal Aunt   . Lung cancer Maternal Uncle   . Stroke Maternal Uncle   . Other Sister        "breathing machine at night"  . Headache Sister   . Thyroid disease Sister   . Throat cancer Neg Hx   . Pancreatic cancer Neg Hx   . Esophageal cancer Neg Hx   . Kidney disease Neg Hx   . Liver disease Neg Hx      Prior to Admission medications   Medication Sig Start Date End Date Taking? Authorizing Provider  acetaminophen (TYLENOL) 325 MG tablet Take 650 mg by mouth every 6 (six) hours as needed (for pain or headaches).    Yes [provider]  albuterol (VENTOLIN HFA) 108 (90 Base) MCG/ACT inhaler INHALE 2 PUFFS INTO THE LUNGS EVERY 6 HOURS AS NEEDED FOR WHEEZING OR SHORTNESS OF BREATH Patient taking differently: Inhale 2 puffs into the lungs every 6 (six) hours as needed for wheezing  or shortness of breath.  09/07/19  Yes Spero Geralds, MD  amitriptyline (ELAVIL) 50 MG tablet TAKE 1 TABLET(50 MG) BY MOUTH AT BEDTIME Patient taking differently: Take 50 mg by mouth at bedtime.  07/10/19  Yes McCue, Janett Billow, NP  amLODipine (NORVASC) 10 MG tablet Take 1 tablet (10 mg total) by mouth daily. 05/15/19 11/24/19 Yes Larey Dresser, MD  aspirin EC 81 MG EC tablet Take 1 tablet (81 mg total) by mouth daily. 12/12/17  Yes [facility], Tami Lin, PA  carvedilol (COREG) 25 MG tablet Take 1 tablet (25 mg total) by mouth 2 (two) times daily with a meal. 09/28/18 11/24/19 Yes Arrien, Jimmy Picket, MD  cloNIDine (CATAPRES - DOSED IN MG/24 HR) 0.3 mg/24hr patch Place 1 patch (0.3 mg total) onto the skin once a week. 09/28/18  Yes Arrien, Jimmy Picket, MD  doxazosin (CARDURA) 8 MG tablet Take 1 tablet (8 mg total) by mouth at bedtime. 11/08/19  Yes Skeet Latch, MD  ezetimibe (ZETIA) 10 MG tablet Take 1 tablet (10 mg total) by mouth daily. 08/03/18 11/24/19 Yes Larey Dresser, MD  minoxidil (LONITEN) 10 MG tablet Take 10 mg by mouth 2 (two) times daily. 07/20/19  Yes [provider]  pantoprazole (PROTONIX) 40 MG tablet TAKE 1 TABLET(40 MG) BY MOUTH DAILY Patient taking differently: Take by mouth daily.  06/20/19  Yes Ladene Artist, MD  rosuvastatin (CRESTOR) 40 MG tablet Take 1 tablet (40 mg total) by mouth daily. 05/09/18 11/24/19 Yes Larey Dresser, MD  STIOLTO RESPIMAT 2.5-2.5 MCG/ACT AERS INHALE 2 PUFFS INTO THE LUNGS DAILY 09/06/19  Yes Spero Geralds, MD  Blood Pressure Monitor KIT 1 Device by Does not apply route 2 (two) times daily. 11/29/18   Kerin Perna, NP  Evolocumab (REPATHA SURECLICK) 340 MG/ML SOAJ Inject 1 Dose into the skin every 14 (fourteen) days. Patient not taking: Reported on 11/24/2019 05/25/19   Pixie Casino, MD    Physical Exam: Vitals:   11/24/19 1630 11/24/19 1645 11/24/19 1700 11/24/19 1715  BP: (!) 159/98 (!) 161/92 (!) 162/92 (!) 167/82   Pulse: 83 85 81 82  Resp: _0 Temp:      TempSrc:      SpO2: 99% 99% 99% 99%    Constitutional: NAD, calm  Eyes: PERTLA, lids and conjunctivae normal ENMT: Mucous membranes  are moist. Posterior pharynx clear of any exudate or lesions.   Neck: normal, supple, no masses, no thyromegaly Respiratory:  no wheezing, no crackles. No accessory muscle use.  Cardiovascular: S1 & S2 heard, regular rate and rhythm. No extremity edema.   Abdomen: No distension, no tenderness, soft. Bowel sounds active.  Musculoskeletal: no clubbing / cyanosis. No joint deformity upper and lower extremities.   Skin: no significant rashes, lesions, ulcers. Warm, dry, well-perfused. Neurologic: CN 2-12 grossly intact. Sensation intact. Moving all extremities.  Psychiatric: Alert and oriented to person, place, and situation. Calm and cooperative.    Labs and Imaging on Admission: I have personally reviewed following labs and imaging studies  CBC: Recent Labs  Lab 11/24/19 1300  WBC 5.4  HGB 13.5  HCT 42.6  MCV 98.4  PLT 601*   Basic Metabolic Panel: Recent Labs  Lab 11/24/19 1300  NA 141  K 3.9  CL 108  CO2 22  GLUCOSE 96  BUN 26*  CREATININE 3.09*  CALCIUM 9.3   GFR: CrCl cannot be calculated (Unknown ideal weight.). Liver Function Tests: No results for input(s): AST, ALT, ALKPHOS, BILITOT, PROT, ALBUMIN in the last 168 hours. No results for input(s): LIPASE, AMYLASE in the last 168 hours. No results for input(s): AMMONIA in the last 168 hours. Coagulation Profile: No results for input(s): INR, PROTIME in the last 168 hours. Cardiac Enzymes: No results for input(s): CKTOTAL, CKMB, CKMBINDEX, TROPONINI in the last 168 hours. BNP (last 3 results) No results for input(s): PROBNP in the last 8760 hours. HbA1C: No results for input(s): HGBA1C in the last 72 hours. CBG: No results for input(s): GLUCAP in the last 168 hours. Lipid Profile: No results for input(s): CHOL, HDL, LDLCALC,  TRIG, CHOLHDL, LDLDIRECT in the last 72 hours. Thyroid Function Tests: No results for input(s): TSH, T4TOTAL, FREET4, T3FREE, THYROIDAB in the last 72 hours. Anemia Panel: No results for input(s): VITAMINB12, FOLATE, FERRITIN, TIBC, IRON, RETICCTPCT in the last 72 hours. Urine analysis:    Component Value Date/Time   COLORURINE YELLOW 01/31/2017 [place] 01/31/2017 1203   LABSPEC >1.046 (H) 01/31/2017 1203   PHURINE 5.0 01/31/2017 1203   GLUCOSEU NEGATIVE 01/31/2017 1203   HGBUR NEGATIVE 01/31/2017 1203   HGBUR negative 02/18/2009 0901   BILIRUBINUR NEGATIVE 01/31/2017 1203   KETONESUR NEGATIVE 01/31/2017 1203   PROTEINUR 100 (A) 01/31/2017 1203   UROBILINOGEN 0.2 01/02/2015 1945   NITRITE NEGATIVE 01/31/2017 1203   LEUKOCYTESUR NEGATIVE 01/31/2017 1203   Sepsis Labs: _0 (procalcitonin:4,lacticidven:4) )No results found for this or any previous visit (from the past 240 hour(s)).   Radiological Exams on Admission: DG Chest 2 View  Result Date: 11/24/2019 CLINICAL DATA:  Chest pain, shortness of breath. EXAM: CHEST - 2 VIEW COMPARISON:  09/24/2018 FINDINGS: Similar appearance of the chest with mildly enlarged cardiac silhouette, median sternotomy, and surgical aortic annulus and multi component stent endograft repair of the aorta. Similar mediastinal contour. Similar chronic blunting of the left costophrenic sulcus, likely related to scar. No pneumothorax. IMPRESSION: 1. No evidence of acute cardiopulmonary disease. 2. Similar cardiomegaly with extensive postoperative changes related to aortic annulus and multi component stent endograft repair of the aorta. Electronically Signed   By: Margaretha Sheffield MD   On: 11/24/2019 13:31   CT Angio Chest/Abd/Pel for Dissection W and/or Wo Contrast  Result Date: 11/24/2019 CLINICAL DATA:  49 year old male with a history of thoracic aortic dissection status post repair with left upper chest pain. EXAM: CT ANGIOGRAPHY  CHEST,  ABDOMEN AND PELVIS TECHNIQUE: Non-contrast CT of the chest was initially obtained. Multidetector CT imaging through the chest, abdomen and pelvis was performed using the standard protocol during bolus administration of intravenous contrast. Multiplanar reconstructed images and MIPs were obtained and reviewed to evaluate the vascular anatomy. CONTRAST:  121m OMNIPAQUE IOHEXOL 350 MG/ML SOLN COMPARISON:  Prior CT arteriogram 08/25/2018 FINDINGS: CTA CHEST FINDINGS Cardiovascular: Surgical changes of complex prior thoracic aortic repair with replaced aortic valve, deep branching and reimplantation of the branch arteries, [place] trunk graft repair of the ascending thoracic aorta, and stent graft repair of the transverse and descending thoracic aorta. No evidence of new dissection flap. The reimplanted brachiocephalic, left common carotid and left subclavian arteries are all widely patent. No evidence of dissection. The main pulmonary artery is normal in size. Stable cardiomegaly with marked concentric hypertrophy of the left ventricular myocardium. Mediastinum/Nodes: No enlarged mediastinal, hilar, or axillary lymph nodes. Thyroid gland, trachea, and esophagus demonstrate no significant findings. Lungs/Pleura: Lungs are clear. No pleural effusion or pneumothorax. Minimal chronic atelectasis versus scarring in the left lower lobe and periphery of the lingula. Musculoskeletal: Healed median sternotomy. No acute osseous abnormality. Review of the MIP images confirms the above findings. CTA ABDOMEN AND PELVIS FINDINGS VASCULAR Aorta: Stable appearance of complex postsurgical changes including tube graft repair of the visceral and juxtarenal abdominal aorta with probable reimplantation of the celiac axis and SMA. Additionally, there appears to be an aortic to distal renal bypass graft on the left. No evidence of complication or interval change. A circumscribed fluid collection adjacent to the repaired aortic segment  remains stable at 3.6 x 3.0 cm. 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 Celiac: Reimplanted/grafted celiac artery remains widely patent. SMA: Reimplanted/grafted SMA remains widely patent. Renals: Reimplanted/graft to bilateral renal arteries remain patent. IMA: Patent without evidence of aneurysm, dissection, vasculitis or significant stenosis. 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 Veins: No focal venous abnormality. Review of the MIP images confirms the above findings. NON-VASCULAR Hepatobiliary: No focal liver abnormality is seen. No gallstones, gallbladder wall thickening, or biliary dilatation. Pancreas: Unremarkable. No pancreatic ductal dilatation or surrounding inflammatory changes. Spleen: Normal in size without focal abnormality. Adrenals/Urinary Tract: Adrenal glands are unremarkable. Kidneys are normal, without renal calculi, focal lesion, or hydronephrosis. Bladder is unremarkable. Stomach/Bowel: Stomach is within normal limits. Appendix appears normal. No evidence of bowel wall thickening, distention, or inflammatory changes. Lymphatic: No suspicious lymphadenopathy. Reproductive: Prostate is unremarkable. Other: No abdominal wall hernia or abnormality. No abdominopelvic ascites. Musculoskeletal: No acute or significant osseous findings. Review of the MIP images confirms the above findings. IMPRESSION: 1. Stable appearance of complex prior combined open and endovascular repair of prior ascending thoracic and abdominal aortic dissection/aneurysms. Full details as above. No evidence of new dissection or acute complication. 2. Slight interval enlargement of the ectatic native distal infrarenal aorta to a maximum of 2.9 cm today compared to 2.7 cm previously. 3. Cardiomegaly with marked left  ventricular hypertrophy. 4. No acute abnormality within the abdomen or pelvis. Electronically Signed   By: HJacqulynn CadetM.D.   On: 11/24/2019 15:22    EKG: Independently reviewed. Sinus rhythm, LVH with repolarization abnormality.   Assessment/Plan   1. Chest pain; CAD  - Presents with 5 days of chest pain, had  no acute findings on dissection study and mildly elevated troponin  - Cardiology consulting and much appreciated, statin and ASA will be continued, planning to evaluate further with echo and Lexiscan Myoview    2. Hypertension  - BP elevated in ED  - Resume home medications and titrate up if needed   3. Aortic dissection s/p repair  - No acute findings on dissection study    4. CKD IIIb-IV  - SCr is 3.09 on admission, down from 3.27 three weeks ago but had been in 2.1 range prior to that  - He received IV contrast in ED and will be gently hydrated with IVF overnight, medications to be renally-dosed, and renal function will be monitored with daily chem panel while in the hospital    5. Cardiomyopathy  - Appears compensated  - He was given IV contrast in ED and will be gently hydrated with IVF overnight  - Monitor wt and I/Os, continue beta-blocker   6. History of CVA  - Continue ASA and statin    7. COPD  - No cough or wheezing on admission  - Continue inhalers     DVT prophylaxis: sq heparin  Code Status: Full  Family Communication: Discussed with patient  Disposition Plan:  Patient is from: Home  Anticipated d/c is to: TBD Anticipated d/c date is: Possibly as early as 11/25/19 Patient currently: Pending cardiology consultation  Consults called: Cardiology consulted by ED physician  Admission status: Observation     Vianne Bulls, MD [facility]  11/24/2019, 8:05 PM

## 2019-11-24 NOTE — ED Provider Notes (Signed)
Care assumed from [facility], [place]. See her note for full H&P.  Per her note, "[name] is a 49 y.o. male with PMHx aortic dissection s/p Bioprosthetic Bentall and total arch replacement and staged endovascular repair of descending aorta ([facility] in 2016), CKD stage III, CHF with EF 45%, HTN, HLD who presents to the ED today with complaint of sudden onset, constant, "tearing" left sided chest pain with radiation into epigastric/LUQ abdomen for the past 5 days. At time of triage pt reported this felt similar to his previous dissection in 2016. He has been taking Tylenol at home without relief prompting him to come to the ED today. Pt does report that he has had similar pains from "time to time" over the years however it will typically go away on its own and not last this long. Pt reports he is compliant with all of his medications. He does however still smoke cigarettes. He denies fevers, chills, shortness of breath, nausea, vomiting, diarrhea, constipation, diaphoresis, leg swelling, or any other associated symptoms.   The history is provided by the patient and medical records. "   Physical Exam  BP (!) 167/82   Pulse 82   Temp 98.1 F (36.7 C) (Oral)   Resp 18   SpO2 99%   Physical Exam Constitutional:      General: He is not in acute distress.    Appearance: He is well-developed.  Eyes:     Conjunctiva/sclera: Conjunctivae normal.  Cardiovascular:     Rate and Rhythm: Normal rate.  Pulmonary:     Effort: Pulmonary effort is normal.  Skin:    General: Skin is warm and dry.  Neurological:     Mental Status: He is alert and oriented to person, place, and time.       ED Course/Procedures     Procedures  Results for orders placed or performed during the hospital encounter of 03/24/30  Basic metabolic panel  Result Value Ref Range   Sodium 141 135 - 145 mmol/L   Potassium 3.9 3.5 - 5.1 mmol/L   Chloride 108 98 - 111 mmol/L   CO2 22 22 - 32 mmol/L   Glucose, Bld 96 70 -  99 mg/dL   BUN 26 (H) 6 - 20 mg/dL   Creatinine, Ser 3.09 (H) 0.61 - 1.24 mg/dL   Calcium 9.3 8.9 - 10.3 mg/dL   GFR calc non Af Amer 23 (L) >60 mL/min   GFR calc Af Amer 26 (L) >60 mL/min   Anion gap 11 5 - 15  CBC  Result Value Ref Range   WBC 5.4 4.0 - 10.5 K/uL   RBC 4.33 4.22 - 5.81 MIL/uL   Hemoglobin 13.5 13.0 - 17.0 g/dL   HCT 42.6 39 - 52 %   MCV 98.4 80.0 - 100.0 fL   MCH 31.2 26.0 - 34.0 pg   MCHC 31.7 30.0 - 36.0 g/dL   RDW 13.2 11.5 - 15.5 %   Platelets 115 (L) 150 - 400 K/uL   nRBC 0.0 0.0 - 0.2 %  Troponin I (High Sensitivity)  Result Value Ref Range   Troponin I (High Sensitivity) 59 (H) <18 ng/L  Troponin I (High Sensitivity)  Result Value Ref Range   Troponin I (High Sensitivity) 88 (H) <18 ng/L   DG Chest 2 View  Result Date: 11/24/2019 CLINICAL DATA:  Chest pain, shortness of breath. EXAM: CHEST - 2 VIEW COMPARISON:  09/24/2018 FINDINGS: Similar appearance of the chest with mildly enlarged cardiac  silhouette, median sternotomy, and surgical aortic annulus and multi component stent endograft repair of the aorta. Similar mediastinal contour. Similar chronic blunting of the left costophrenic sulcus, likely related to scar. No pneumothorax. IMPRESSION: 1. No evidence of acute cardiopulmonary disease. 2. Similar cardiomegaly with extensive postoperative changes related to aortic annulus and multi component stent endograft repair of the aorta. Electronically Signed   By: Margaretha Sheffield MD   On: 11/24/2019 13:31   CT Angio Chest/Abd/Pel for Dissection W and/or Wo Contrast  Result Date: 11/24/2019 CLINICAL DATA:  49 year old male with a history of thoracic aortic dissection status post repair with left upper chest pain. EXAM: CT ANGIOGRAPHY CHEST, ABDOMEN AND PELVIS TECHNIQUE: Non-contrast CT of the chest was initially obtained. Multidetector CT imaging through the chest, abdomen and pelvis was performed using the standard protocol during bolus administration of  intravenous contrast. Multiplanar reconstructed images and MIPs were obtained and reviewed to evaluate the vascular anatomy. CONTRAST:  154mL OMNIPAQUE IOHEXOL 350 MG/ML SOLN COMPARISON:  Prior CT arteriogram 08/25/2018 FINDINGS: CTA CHEST FINDINGS Cardiovascular: Surgical changes of complex prior thoracic aortic repair with replaced aortic valve, deep branching and reimplantation of the branch arteries, Elephant trunk graft repair of the ascending thoracic aorta, and stent graft repair of the transverse and descending thoracic aorta. No evidence of new dissection flap. The reimplanted brachiocephalic, left common carotid and left subclavian arteries are all widely patent. No evidence of dissection. The main pulmonary artery is normal in size. Stable cardiomegaly with marked concentric hypertrophy of the left ventricular myocardium. Mediastinum/Nodes: No enlarged mediastinal, hilar, or axillary lymph nodes. Thyroid gland, trachea, and esophagus demonstrate no significant findings. Lungs/Pleura: Lungs are clear. No pleural effusion or pneumothorax. Minimal chronic atelectasis versus scarring in the left lower lobe and periphery of the lingula. Musculoskeletal: Healed median sternotomy. No acute osseous abnormality. Review of the MIP images confirms the above findings. CTA ABDOMEN AND PELVIS FINDINGS VASCULAR Aorta: Stable appearance of complex postsurgical changes including tube graft repair of the visceral and juxtarenal abdominal aorta with probable reimplantation of the celiac axis and SMA. Additionally, there appears to be an aortic to distal renal bypass graft on the left. No evidence of complication or interval change. A circumscribed fluid collection adjacent to the repaired aortic segment remains stable at 3.6 x 3.0 cm. 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 Celiac:  Reimplanted/grafted celiac artery remains widely patent. SMA: Reimplanted/grafted SMA remains widely patent. Renals: Reimplanted/graft to bilateral renal arteries remain patent. IMA: Patent without evidence of aneurysm, dissection, vasculitis or significant stenosis. 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 Veins: No focal venous abnormality. Review of the MIP images confirms the above findings. NON-VASCULAR Hepatobiliary: No focal liver abnormality is seen. No gallstones, gallbladder wall thickening, or biliary dilatation. Pancreas: Unremarkable. No pancreatic ductal dilatation or surrounding inflammatory changes. Spleen: Normal in size without focal abnormality. Adrenals/Urinary Tract: Adrenal glands are unremarkable. Kidneys are normal, without renal calculi, focal lesion, or hydronephrosis. Bladder is unremarkable. Stomach/Bowel: Stomach is within normal limits. Appendix appears normal. No evidence of bowel wall thickening, distention, or inflammatory changes. Lymphatic: No suspicious lymphadenopathy. Reproductive: Prostate is unremarkable. Other: No abdominal wall hernia or abnormality. No abdominopelvic ascites. Musculoskeletal: No acute or significant osseous findings. Review of the MIP images confirms the above findings. IMPRESSION: 1. Stable appearance  of complex prior combined open and endovascular repair of prior ascending thoracic and abdominal aortic dissection/aneurysms. Full details as above. No evidence of new dissection or acute complication. 2. Slight interval enlargement of the ectatic native distal infrarenal aorta to a maximum of 2.9 cm today compared to 2.7 cm previously. 3. Cardiomegaly with marked left ventricular hypertrophy. 4. No acute abnormality within the abdomen or pelvis. Electronically Signed   By: Jacqulynn Cadet M.D.   On: 11/24/2019 15:22    EKG Interpretation  Date/Time:  Friday November 24 2019  12:52:16 EDT Ventricular Rate:  133 PR Interval:  148 QRS Duration: 84 QT Interval:  380 QTC Calculation: 565 R Axis:   6 Text Interpretation: Long QTc Sinus tachycardia Biatrial enlargement Left ventricular hypertrophy with repolarization abnormality ( Cornell product ) Abnormal ECG Confirmed by Quintella Reichert [phone]) on 11/24/2019 6:20:39 PM        MDM   Briefly, 49 year old male with history of aortic dissection, hyperlipidemia, CKD, CAD, aortic insufficiency, mitral regurg, presenting for evaluation of chest pain radiating to the back for 5 days.  Patient describes pain, palpitations and intermittent shortness of breath that seem to be having with his daily activities and with exertion.  States when he is mowed the lawn earlier this week he had to stop several times which is not normal for him.  Reviewed/interpreted labs CBC is without leukocytosis or anemia, thrombocytopenia present which appears chronic BNP is elevated at 26 and creatinine is also elevated which appears stable from his prior labs 3 weeks ago Initial troponin elevated at 59, delta elevated above 80.  EKG with Long QTc Sinus tachycardia Biatrial enlargement Left ventricular hypertrophy with repolarization abnormality ( Cornell product ) Abnormal ECG  Chest x-ray - 1. No evidence of acute cardiopulmonary disease. 2. Similar cardiomegaly with extensive postoperative changes related to aortic annulus and multi component stent endograft repair of the aorta.  CT dissection protocol - 1. Stable appearance of complex prior combined open and endovascular repair of prior ascending thoracic and abdominal aortic dissection/aneurysms. Full details as above. No evidence of new dissection or acute complication. 2. Slight interval enlargement of the ectatic native distal infrarenal aorta to a maximum of 2.9 cm today compared to 2.7 cm previously. 3. Cardiomegaly with marked left ventricular hypertrophy. 4. No acute abnormality  within the abdomen or pelvis.  Coronary angiography 09/2018 1. Large, tortuous coronary arteries, mild to moderate disease involving the main branches. Appearance is similar to prior catheterizations in 11/2017. 2. Patent ostial ramus intermedius stent.  Patient presenting with chest pain, dissection study negative.  Symptoms seem consistent with unstable angina.  Will consult cardiology.  7:12 PM CONSULT with Dr. Gilman Schmidt with cardiology who will evaluate the patient in the emergency department.  He recommends admission to hospitalist service with plan for possible stress test versus cast pending their evaluation.  Advised to hold on initiating heparin at this time until cardiology can evaluate.  CONSULT with Dr. Myna Hidalgo who accept pt for admission   Bishop Dublin 11/24/19 2009    Quintella Reichert, MD 11/24/19 2040

## 2019-11-24 NOTE — ED Notes (Signed)
Food given 

## 2019-11-24 NOTE — ED Provider Notes (Signed)
[facility] EMERGENCY DEPARTMENT Provider Note   CSN: [id number] Arrival date & time: 11/24/19  1246     History Chief Complaint  Patient presents with  . Chest Pain    [name] is a 49 y.o. male with PMHx aortic dissection s/p Bioprosthetic Bentall and total arch replacement and staged endovascular repair of descending aorta ([facility] in 2016), CKD stage III, CHF with EF 45%, HTN, HLD who presents to the ED today with complaint of sudden onset, constant, "tearing" left sided chest pain with radiation into epigastric/LUQ abdomen for the past 5 days. At time of triage pt reported this felt similar to his previous dissection in 2016. He has been taking Tylenol at home without relief prompting him to come to the ED today. Pt does report that he has had similar pains from "time to time" over the years however it will typically go away on its own and not last this long. Pt reports he is compliant with all of his medications. He does however still smoke cigarettes. He denies fevers, chills, shortness of breath, nausea, vomiting, diarrhea, constipation, diaphoresis, leg swelling, or any other associated symptoms.   The history is provided by the patient and medical records.       Past Medical History:  Diagnosis Date  . Adenomatous colon polyp 08/2015  . Anxiety   . Aortic disease ([place])   . Aortic dissection (HCC)    a. admx 04/2014 >> L renal infarct; a/c renal failure >> b.  s/p Bioprosthetic Bentall and total arch replacement and staged endovascular repair of descending aortic aneurysm ([facility] - Dr. [name])  . CAD (coronary artery disease)    a. LHC 4/16:  oD1 60%  . Cardiomyopathy ([place])    a. non-ischemic - probably related to untreated HTN and ETOH abuse - Echo 3/13 with EF 35-40% >> b. Echo 4/16: Severe LVH, EF 55-60%, moderate AI, moderate MR, mild LAE, trivial effusion, known type B dissection with communication between true and false lumens with suprasternal images  suggesting dissection plane may propagate to at least left subclavian takeoff, root above aortic valve okay    . Chronic abdominal pain   . Chronic combined systolic and diastolic congestive heart failure ([place])    a. 05/2011: Adm with pulm edema/HTN urgency, EF 35-40% with diffuse hypokinesis and moderate to severe mitral regurgitation. Cardiomyopathy likely due to uncontrolled HTN and ETOH abuse - cath deferred due to renal insufficiency (felt due to uncontrolled HTN). bJodie Echevaria MV 06/2011: EF 37% and no ischemia or infarction. c. EF 45-50% by echo 01/2012.  [name] Chronic sinusitis   . CKD (chronic kidney disease)    a. 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  peak creatinine 3.46 during admx for aortic dissection 2/16.  sees Dr [name]  . DDD (degenerative disc disease), lumbar   . Depression   . Descending thoracic aortic aneurysm ([place])   . Dissecting aneurysm of thoracic aorta ([place])   . ETOH abuse    a. Reported to have quit 05/2011.  . Frequent headaches   . GERD (gastroesophageal reflux disease)   . Headache(784.0)    "q other day" (08/08/2013)  . Heart murmur   . Hemorrhoid thrombosis   . History of echocardiogram    Echo 1/17:  Severe LVH, EF 55-60%, no RWMA, Gr 2 DD, AVR ok, mild to mod MR, mild LAE, mild reduced RVSF, mod RAE  . History of medication noncompliance   . HYPERLIPIDEMIA   . Hypertension  a. Hx of HTN urgency secondary to noncompliance. b. urinary metanephrine and catecholeamine levels normal 2013.  c. Renal art [name] 1/16:  No evidence of renal artery stenosis noted bilaterally.  . INGUINAL HERNIA   . Pneumonia ~ 2013  . Renal insufficiency   . Serrated adenoma of colon 08/2015  . Stroke ([place])   . Tobacco abuse   . Valvular heart disease    a. Echo 05/2011: moderate to severe eccentric MR and mild to moderate AI with prolapsing left coronary cusp. b. Echo 01/2012: mild-mod AI, mild dilitation of aortic root, mild MR.;  c. Echo 1/16: Severe LVH consistent with hypertrophic cardio  myopathy, EF 50%, no RWMA, mod AI, mild MR, mild RAE, dilated Ao root (40 mm);     Patient Active Problem List   Diagnosis Date Noted  . Chest pain, rule out acute myocardial infarction 09/24/2018  . Chronic systolic (congestive) heart failure ([place]) 04/25/2018  . Acute on chronic combined systolic and diastolic CHF (congestive heart failure) ([place]) 12/08/2017  . Coronary artery disease involving native coronary artery of native heart with angina pectoris ([place]) 12/06/2017  . NSTEMI (non-ST elevated myocardial infarction) ([place]) 12/02/2017  . Hypertensive emergency - Accelerated Hypertension 12/02/2017  . History of stroke 10/05/2017  . Somnolence, daytime 04/07/2017  . Snoring 04/07/2017  . Cerebral infarction due to embolism of left middle cerebral artery ([place]) 06/03/2016  . History of aortic dissection 06/03/2016  . Palpitation   . Cerebrovascular accident (CVA) due to embolism of left middle cerebral artery ([place])   . Seizures ([place]) 02/28/2016  . HLD (hyperlipidemia) 08/28/2015  . 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[place]) 06/07/2015  . Cardiomyopathy ([place]) 03/01/2015  . Acid reflux 03/01/2015  . Essential hypertension 03/01/2015  . S/P aortic dissection repair 10/03/2014  . H/O aortic valve replacement 10/03/2014  . CKD (chronic kidney disease) stage 3, GFR 30-59 ml/min 05/15/2014  . AKI (acute kidney injury) ([place])   . Dissecting aneurysm of thoracic aorta, Stanford type B ([place]) 04/24/2014  . Aortic dissection ([place]) 04/23/2014  . Aneurysm of ascending aorta (HCC) 03/28/2014  . Dyspnea 03/27/2014  . ETOH abuse 09/20/2013  . Malignant hypertension 02/20/2013  . Hypertensive urgency 01/27/2012  . Cardiomyopathy, hypertensive ([place]) 01/26/2012  . AI (aortic insufficiency) 01/26/2012  . MR (mitral regurgitation) 01/26/2012  . Acute renal failure superimposed on stage 3 chronic kidney disease ([place]) 01/26/2012  . Hypertensive cardiomyopathy, without heart failure ([place]): EF reduced to  35-40% from 55%. 06/10/2011  . Chronic bronchitis 05/23/2011  . Cardiomegaly - hypertensive 05/22/2011  . Marijuana use 05/22/2011  . Abdominal pain 05/22/2011  . Hypertension, malignant 05/22/2011  . Anxiety and depression 05/22/2011  . Hyperlipidemia LDL goal <70 08/26/2009  . Tobacco use 08/26/2009  . Headache(784.0) 08/26/2009  . Aortic valve disorder 03/11/2009  . INGUINAL HERNIA 02/18/2009  . Uncontrolled hypertension 01/16/2009    Past Surgical History:  Procedure Laterality Date  . ANKLE SURGERY Bilateral    Fractures bilaterally  . AORTIC VALVE SURGERY  09/2014  . CORONARY ANGIOGRAPHY N/A 12/06/2017   Procedure: CORONARY ANGIOGRAPHY;  Surgeon: Belva Crome, MD;  Location: [facility];  Service: Cardiovascular;  Laterality: N/A;  . CORONARY ANGIOGRAPHY N/A 09/26/2018   Procedure: CORONARY ANGIOGRAPHY;  Surgeon: Nelva Bush, MD;  Location: [facility];  Service: Cardiovascular;  Laterality: N/A;  . CORONARY STENT INTERVENTION N/A 12/10/2017   Procedure: CORONARY STENT INTERVENTION;  Surgeon: [name] Sine, MD;  Location: [facility];  Service: Cardiovascular;  Laterality: N/A;  . FOOT FRACTURE SURGERY Bilateral 2004-2010   "got pins in both of them"  . HEMORRHOID SURGERY N/A 06/15/2015   Procedure: HEMORRHOIDECTOMY;  Surgeon: Stark Klein, MD;  Location: [facility];  Service: General;  Laterality: N/A;  . INGUINAL HERNIA REPAIR Right ~ 1996  . LEFT HEART CATHETERIZATION WITH CORONARY ANGIOGRAM N/A 06/21/2014   Procedure: LEFT HEART CATHETERIZATION WITH CORONARY ANGIOGRAM;  Surgeon: Larey Dresser, MD;  Location[facility] CATH LAB;  Service: Cardiovascular;  Laterality: N/A;  . LOOP RECORDER INSERTION N/A 06/19/2016   Procedure: Loop Recorder Insertion;  Surgeon: Deboraha Sprang, MD;  Location: [facility];  Service: Cardiovascular;  Laterality: N/A;  . TEE WITHOUT CARDIOVERSION N/A 03/12/2016   Procedure: TRANSESOPHAGEAL ECHOCARDIOGRAM (TEE);  Surgeon: Sanda Klein, MD;  Location[facility];  Service: Cardiovascular;  Laterality: N/A;       Family History  Problem Relation Age of Onset  . Hypertension Mother   . Hypertension Other   . Colon cancer Paternal Uncle   . Stroke Maternal Aunt   . Heart attack Brother   . Hypertension Brother   . Diabetes Maternal Aunt   . Lung cancer Maternal Uncle   . Stroke Maternal Uncle   . Other Sister        "breathing machine at night"  . Headache Sister   . Thyroid disease Sister   . Throat cancer Neg Hx   . Pancreatic cancer Neg Hx   . Esophageal cancer Neg Hx   . Kidney disease Neg Hx   . Liver disease Neg Hx     Social History   Tobacco Use  . Smoking status: Current Some Day Smoker    Packs/day: 0.25    Years: 23.00    Pack years: 5.75    Types: Cigarettes  . Smokeless tobacco: Never Used  . Tobacco comment: 3 to 4 per day  Vaping Use  . Vaping Use: Never assessed  Substance Use Topics  . Alcohol use: Yes    Alcohol/week: 1.0 standard drink    Types: 1 Cans of beer per week    Comment: occasionally  . Drug use: Yes    Types: Marijuana    Comment: 2 times/week    Home Medications Prior to Admission medications   Medication Sig Start Date End Date Taking? Authorizing Provider  acetaminophen (TYLENOL) 325 MG tablet Take 650 mg by mouth every 6 (six) hours as needed (for pain or headaches).    Yes [provider]  albuterol (VENTOLIN HFA) 108 (90 Base) MCG/ACT inhaler INHALE 2 PUFFS INTO THE LUNGS EVERY 6 HOURS AS NEEDED FOR WHEEZING OR SHORTNESS OF BREATH Patient taking differently: Inhale 2 puffs into the lungs every 6 (six) hours as needed for wheezing or shortness of breath.  09/07/19  Yes Spero Geralds, MD  amitriptyline (ELAVIL) 50 MG tablet TAKE 1 TABLET(50 MG) BY MOUTH AT BEDTIME Patient taking differently: Take 50 mg by mouth at bedtime.  07/10/19  Yes McCue, Janett Billow, NP  amLODipine (NORVASC) 10 MG tablet Take 1 tablet (10 mg total) by mouth daily. 05/15/19  11/24/19 Yes Larey Dresser, MD  aspirin EC 81 MG EC tablet Take 1 tablet (81 mg total) by mouth daily. 12/12/17  Yes [facility], Tami Lin, PA  carvedilol (COREG) 25 MG tablet Take 1 tablet (25 mg total) by mouth 2 (two) times daily with a meal. 09/28/18 11/24/19 Yes Arrien, Jimmy Picket, MD  cloNIDine (CATAPRES - DOSED IN MG/24 HR) 0.3  mg/24hr patch Place 1 patch (0.3 mg total) onto the skin once a week. 09/28/18  Yes Arrien, Jimmy Picket, MD  doxazosin (CARDURA) 8 MG tablet Take 1 tablet (8 mg total) by mouth at bedtime. 11/08/19  Yes Skeet Latch, MD  ezetimibe (ZETIA) 10 MG tablet Take 1 tablet (10 mg total) by mouth daily. 08/03/18 11/24/19 Yes Larey Dresser, MD  minoxidil (LONITEN) 10 MG tablet Take 10 mg by mouth 2 (two) times daily. 07/20/19  Yes [provider]  pantoprazole (PROTONIX) 40 MG tablet TAKE 1 TABLET(40 MG) BY MOUTH DAILY Patient taking differently: Take by mouth daily.  06/20/19  Yes Ladene Artist, MD  rosuvastatin (CRESTOR) 40 MG tablet Take 1 tablet (40 mg total) by mouth daily. 05/09/18 11/24/19 Yes Larey Dresser, MD  STIOLTO RESPIMAT 2.5-2.5 MCG/ACT AERS INHALE 2 PUFFS INTO THE LUNGS DAILY 09/06/19  Yes Spero Geralds, MD  Blood Pressure Monitor KIT 1 Device by Does not apply route 2 (two) times daily. 11/29/18   Kerin Perna, NP  Evolocumab (REPATHA SURECLICK) 616 MG/ML SOAJ Inject 1 Dose into the skin every 14 (fourteen) days. Patient not taking: Reported on 11/24/2019 05/25/19   Pixie Casino, MD    Allergies    Imdur [isosorbide nitrate] and Tramadol  Review of Systems   Review of Systems  Constitutional: Negative for chills, diaphoresis and fever.  Respiratory: Negative for cough and shortness of breath.   Cardiovascular: Positive for chest pain. Negative for leg swelling.  Gastrointestinal: Positive for abdominal pain. Negative for constipation, diarrhea, nausea and vomiting.  Musculoskeletal: Positive for back pain.  All other systems  reviewed and are negative.   Physical Exam Updated Vital Signs BP (!) 177/102 (BP Location: Right Arm)   Pulse (!) 135   Temp 99.2 F (37.3 C) (Oral)   Resp 18   SpO2 99%   Physical Exam Vitals and nursing note reviewed.  Constitutional:      Appearance: He is not ill-appearing or diaphoretic.  HENT:     Head: Normocephalic and atraumatic.  Eyes:     Conjunctiva/sclera: Conjunctivae normal.  Cardiovascular:     Rate and Rhythm: Regular rhythm. Tachycardia present.     Pulses:          Radial pulses are 2+ on the right side and 2+ on the left side.       Dorsalis pedis pulses are 2+ on the right side and 2+ on the left side.     Comments: Sternotomy scar Pulmonary:     Effort: Pulmonary effort is normal.     Breath sounds: Normal breath sounds. No decreased breath sounds, wheezing, rhonchi or rales.  Chest:     Chest wall: Tenderness present.  Abdominal:     Palpations: Abdomen is soft.     Tenderness: There is abdominal tenderness. There is no guarding or rebound.  Musculoskeletal:     Cervical back: Neck supple.     Right lower leg: No tenderness. No edema.     Left lower leg: No tenderness. No edema.  Skin:    General: Skin is warm and dry.  Neurological:     Mental Status: He is alert.     ED Results / Procedures / Treatments   Labs (all labs ordered are listed, but only abnormal results are displayed) Labs Reviewed  BASIC METABOLIC PANEL - Abnormal; Notable for the following components:      Result Value   BUN 26 (*)  Creatinine, Ser 3.09 (*)    GFR calc non Af Amer 23 (*)    GFR calc Af Amer 26 (*)    All other components within normal limits  CBC - Abnormal; Notable for the following components:   Platelets 115 (*)    All other components within normal limits  TROPONIN I (HIGH SENSITIVITY) - Abnormal; Notable for the following components:   Troponin I (High Sensitivity) 59 (*)    All other components within normal limits  TROPONIN I (HIGH  SENSITIVITY) - Abnormal; Notable for the following components:   Troponin I (High Sensitivity) 88 (*)    All other components within normal limits    EKG EKG Interpretation  Date/Time:  Friday November 24 2019 12:52:16 EDT Ventricular Rate:  133 PR Interval:  148 QRS Duration: 84 QT Interval:  380 QTC Calculation: 565 R Axis:   6 Text Interpretation: Long QTc Sinus tachycardia Biatrial enlargement Left ventricular hypertrophy with repolarization abnormality ( Cornell product ) Abnormal ECG Confirmed by Quintella Reichert [phone]) on 11/24/2019 6:20:39 PM   Radiology DG Chest 2 View  Result Date: 11/24/2019 CLINICAL DATA:  Chest pain, shortness of breath. EXAM: CHEST - 2 VIEW COMPARISON:  09/24/2018 FINDINGS: Similar appearance of the chest with mildly enlarged cardiac silhouette, median sternotomy, and surgical aortic annulus and multi component stent endograft repair of the aorta. Similar mediastinal contour. Similar chronic blunting of the left costophrenic sulcus, likely related to scar. No pneumothorax. IMPRESSION: 1. No evidence of acute cardiopulmonary disease. 2. Similar cardiomegaly with extensive postoperative changes related to aortic annulus and multi component stent endograft repair of the aorta. Electronically Signed   By: Margaretha Sheffield MD   On: 11/24/2019 13:31   CT Angio Chest/Abd/Pel for Dissection W and/or Wo Contrast  Result Date: 11/24/2019 CLINICAL DATA:  49 year old male with a history of thoracic aortic dissection status post repair with left upper chest pain. EXAM: CT ANGIOGRAPHY CHEST, ABDOMEN AND PELVIS TECHNIQUE: Non-contrast CT of the chest was initially obtained. Multidetector CT imaging through the chest, abdomen and pelvis was performed using the standard protocol during bolus administration of intravenous contrast. Multiplanar reconstructed images and MIPs were obtained and reviewed to evaluate the vascular anatomy. CONTRAST:  19m OMNIPAQUE IOHEXOL 350 MG/ML  SOLN COMPARISON:  Prior CT arteriogram 08/25/2018 FINDINGS: CTA CHEST FINDINGS Cardiovascular: Surgical changes of complex prior thoracic aortic repair with replaced aortic valve, deep branching and reimplantation of the branch arteries, [place] trunk graft repair of the ascending thoracic aorta, and stent graft repair of the transverse and descending thoracic aorta. No evidence of new dissection flap. The reimplanted brachiocephalic, left common carotid and left subclavian arteries are all widely patent. No evidence of dissection. The main pulmonary artery is normal in size. Stable cardiomegaly with marked concentric hypertrophy of the left ventricular myocardium. Mediastinum/Nodes: No enlarged mediastinal, hilar, or axillary lymph nodes. Thyroid gland, trachea, and esophagus demonstrate no significant findings. Lungs/Pleura: Lungs are clear. No pleural effusion or pneumothorax. Minimal chronic atelectasis versus scarring in the left lower lobe and periphery of the lingula. Musculoskeletal: Healed median sternotomy. No acute osseous abnormality. Review of the MIP images confirms the above findings. CTA ABDOMEN AND PELVIS FINDINGS VASCULAR Aorta: Stable appearance of complex postsurgical changes including tube graft repair of the visceral and juxtarenal abdominal aorta with probable reimplantation of the celiac axis and SMA. Additionally, there appears to be an aortic to distal renal bypass graft on the left. No evidence of complication or interval change. A circumscribed fluid collection  adjacent to the repaired aortic segment remains stable at 3.6 x 3.0 cm. 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 Celiac: Reimplanted/grafted celiac artery remains widely patent. SMA: Reimplanted/grafted SMA remains widely patent. Renals: Reimplanted/graft to bilateral renal arteries remain patent.  IMA: Patent without evidence of aneurysm, dissection, vasculitis or significant stenosis. 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 Veins: No focal venous abnormality. Review of the MIP images confirms the above findings. NON-VASCULAR Hepatobiliary: No focal liver abnormality is seen. No gallstones, gallbladder wall thickening, or biliary dilatation. Pancreas: Unremarkable. No pancreatic ductal dilatation or surrounding inflammatory changes. Spleen: Normal in size without focal abnormality. Adrenals/Urinary Tract: Adrenal glands are unremarkable. Kidneys are normal, without renal calculi, focal lesion, or hydronephrosis. Bladder is unremarkable. Stomach/Bowel: Stomach is within normal limits. Appendix appears normal. No evidence of bowel wall thickening, distention, or inflammatory changes. Lymphatic: No suspicious lymphadenopathy. Reproductive: Prostate is unremarkable. Other: No abdominal wall hernia or abnormality. No abdominopelvic ascites. Musculoskeletal: No acute or significant osseous findings. Review of the MIP images confirms the above findings. IMPRESSION: 1. Stable appearance of complex prior combined open and endovascular repair of prior ascending thoracic and abdominal aortic dissection/aneurysms. Full details as above. No evidence of new dissection or acute complication. 2. Slight interval enlargement of the ectatic native distal infrarenal aorta to a maximum of 2.9 cm today compared to 2.7 cm previously. 3. Cardiomegaly with marked left ventricular hypertrophy. 4. No acute abnormality within the abdomen or pelvis. Electronically Signed   By: Jacqulynn Cadet M.D.   On: 11/24/2019 15:22    Procedures Procedures (including critical care time)  Medications Ordered in ED Medications  fentaNYL (SUBLIMAZE) injection 50 mcg (50 mcg Intravenous Given 11/24/19 1410)  sodium chloride 0.9 % bolus 500 mL (500 mLs Intravenous  New Bag/Given 11/24/19 1600)  iohexol (OMNIPAQUE) 350 MG/ML injection 100 mL (100 mLs Intravenous Contrast Given 11/24/19 1423)  acetaminophen (TYLENOL) tablet 650 mg (650 mg Oral Given 11/24/19 1735)    ED Course  I have reviewed the triage vital signs and the nursing notes.  Pertinent labs & imaging results that were available during my care of the patient were reviewed by me and considered in my medical decision making (see chart for details).    MDM Rules/Calculators/A&P                          49 year old male with a history of aortic dissection status post repair at [facility] in 2016 who presents to the ED today complaining of left-sided/substernal chest pain radiating to left-sided abdomen and through to back that he describes as a tearing sensation the past 5 days.  Reports that this feels similar to his previous dissection.  He was evaluated by provider in triage and labs were ordered, chest x-ray, dissection study.  On arrival to the ED patient is afebrile 99.2 however significantly tachycardic at 135 and hypertensive at 177/102 despite being compliant with his blood pressure medication.  Chest x-ray prior to patient coming back to room, no acute findings.  Lab work pending.  Per chart review patient did have lab work done 3 weeks ago with a kidney function of 3.27 and GFR 25.  Had initially discussed with CT technician regarding the fact that I do not want patient to  wait for his kidney function to go back and I do feel that the benefits outweigh the risks of patient going and making sure he does not have a dissection today.  I was told that we need to gain permission by nephrology.  Consulted Dr. Jim Desanctis with nephrology agrees that it outweighs the risks at this time and recommends normal saline bolus.  I was then told by CT technician that patient would need informed consent.  Attending physician Dr. Vallery Ridge evaluated patient discussed risks with patient.  He would still like to go through with  the imaging at this time.  We will plan to perform dissection study and reevaluate.  CBC without leukocytosis.  Hemoglobin stable at 13.5. BMP with a creatinine today of 3.09 and a GFR of 26.  No other electrolyte abnormalities.  CT Dissection study negative.   Initial troponin of 59, repeat trop of 88. Pt resting comfortably and without chest pain however will touch base with cardiology regarding pt. [name] cardiologist is Dr. Debara Pickett.   At shift change case signed out to [employer], PA-C, who will touch base with cards.   This note was prepared using Dragon voice recognition software and may include unintentional dictation errors due to the inherent limitations of voice recognition software.  Final Clinical Impression(s) / ED Diagnoses Final diagnoses:  Nonspecific chest pain    Rx / DC Orders ED Discharge Orders    None       Eustaquio Maize, PA-C 11/24/19 Jamse Belfast, MD 11/27/19 1836

## 2019-11-24 NOTE — ED Triage Notes (Signed)
Pt arrives to ED w/ c/o 9/10 chest and abdominal pain that he reports as an ache. Pt states pain started worse on Monday. Pt endorses SOB.

## 2019-11-24 NOTE — Consult Note (Addendum)
Cardiology Consultation:   Patient ID: [name] MRN: [id number]; DOB: [birth year]  Admit date: 11/24/2019 Date of Consult: 11/24/2019  Primary Care Provider: Kerin Perna, NP [facility] Cardiologist: [name], MD  [facility] Electrophysiologist:  None    Patient Profile:   [name] is a 49 y.o. male with a hx of aortic dissection, CKD, CAD, hypertension, hyperlipidemia, chronic systolic heart failure, tobacco use who is being seen today for the evaluation of chest pain at the request of Dr. Johnney Killian.  History of Present Illness:   [name] is a 49 year old male with a history of aortic dissection in [id number] complicated by left renal infarct and subsequent CKD.  He underwent bioprosthetic Bentall and total arch replacement with staged endovascular repair of the descending aortic aneurysm.  In addition, he has a history of CAD status post PCI to ramus, hypertension, hyperlipidemia, chronic systolic heart failure, tobacco use.  He presented with NSTEMI 09/2018, echocardiogram showed EF 45 to 50%, severe LVH (PYP scan negative for amyloid 03/2018).  Cath 09/26/2018 showed patent ostial ramus stent, 60% ostial D1 lesion.  He reports that he has been having chest pain since Monday.  Describes pain as a tearing sensation on left side of chest that radiates to his abdomen and back.  States it feels similar to his prior aortic dissection.  Reports pain has been continuous but worsens with any activity.  In the ED, initial vital signs notable for BP 177/102, pulse 135, SPO2 99% on room air.  Labs notable for creatinine 3.09 (down from 3.27 on 11/02/2019 but prior to that baseline appears to be about 2-2.2).  Labs otherwise notable for hemoglobin 13.5, WBC 5.4, high-sensitivity troponin 59 > 88.  EKG shows sinus tachycardia, rate 133, LVH with repolarization abnormalities.  CTA chest abdomen pelvis showed no evidence of new dissection or acute complication.   Past Medical History:   Diagnosis Date   Adenomatous colon polyp 08/2015   Anxiety    Aortic disease ([place])    Aortic dissection (H[facility])    a. admx 04/2014 >> L renal infarct; a/c renal failure >> b.  s/p Bioprosthetic Bentall and total arch replacement and staged endovascular repair of descending aortic aneurysm ([facility] - Dr. [name])   CAD (coronary artery disease)    a. LHC 4/16:  oD1 60%   Cardiomyopathy ([place])    a. non-ischemic - probably related to untreated HTN and ETOH abuse - Echo 3/13 with EF 35-40% >> b. Echo 4/16: Severe LVH, EF 55-60%, moderate AI, moderate MR, mild LAE, trivial effusion, known type B dissection with communication between true and false lumens with suprasternal images suggesting dissection plane may propagate to at least left subclavian takeoff, root above aortic valve okay     Chronic abdominal pain    Chronic combined systolic and diastolic congestive heart failure ([place])    a. 05/2011: Adm with pulm edema/HTN urgency, EF 35-40% with diffuse hypokinesis and moderate to severe mitral regurgitation. Cardiomyopathy likely due to uncontrolled HTN and ETOH abuse - cath deferred due to renal insufficiency (felt due to uncontrolled HTN). bJodie Echevaria MV 06/2011: EF 37% and no ischemia or infarction. c. EF 45-50% by echo 01/2012.   Chronic sinusitis    CKD (chronic kidney disease)    a. 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  peak creatinine 3.46 during admx for aortic dissection 2/16.  sees Dr [name]   DDD (degenerative disc disease), lumbar    Depression    Descending thoracic aortic aneurysm (  [place])    Dissecting aneurysm of thoracic aorta ([place])    ETOH abuse    a. Reported to have quit 05/2011.   Frequent headaches    GERD (gastroesophageal reflux disease)    Headache(784.0)    "q other day" (08/08/2013)   Heart murmur    Hemorrhoid thrombosis    History of echocardiogram    Echo 1/17:  Severe LVH, EF 55-60%, no RWMA, Gr 2 DD, AVR ok, mild to mod MR, mild LAE, mild reduced RVSF, mod  RAE   History of medication noncompliance    HYPERLIPIDEMIA    Hypertension    a. Hx of HTN urgency secondary to noncompliance. b. urinary metanephrine and catecholeamine levels normal 2013.  c. Renal art [name] 1/16:  No evidence of renal artery stenosis noted bilaterally.   INGUINAL HERNIA    Pneumonia ~ 2013   Renal insufficiency    Serrated adenoma of colon 08/2015   Stroke [facility])    Tobacco abuse    Valvular heart disease    a. Echo 05/2011: moderate to severe eccentric MR and mild to moderate AI with prolapsing left coronary cusp. b. Echo 01/2012: mild-mod AI, mild dilitation of aortic root, mild MR.;  c. Echo 1/16: Severe LVH consistent with hypertrophic cardio myopathy, EF 50%, no RWMA, mod AI, mild MR, mild RAE, dilated Ao root (40 mm);     Past Surgical History:  Procedure Laterality Date   ANKLE SURGERY Bilateral    Fractures bilaterally   AORTIC VALVE SURGERY  09/2014   CORONARY ANGIOGRAPHY N/A 12/06/2017   Procedure: CORONARY ANGIOGRAPHY;  Surgeon: Belva Crome, MD;  Location: [place] CV LAB;  Service: Cardiovascular;  Laterality: N/A;   CORONARY ANGIOGRAPHY N/A 09/26/2018   Procedure: CORONARY ANGIOGRAPHY;  Surgeon: Nelva Bush, MD;  Location: [place] [facility];  Service: Cardiovascular;  Laterality: N/A;   CORONARY STENT INTERVENTION N/A 12/10/2017   Procedure: CORONARY STENT INTERVENTION;  Surgeon: [name] Sine, MD;  Location: [place] [facility];  Service: Cardiovascular;  Laterality: N/A;   FOOT FRACTURE SURGERY Bilateral 2004-2010   "got pins in both of them"   HEMORRHOID SURGERY N/A 06/15/2015   Procedure: HEMORRHOIDECTOMY;  Surgeon: Stark Klein, MD;  Location: [place];  Service: General;  Laterality: N/A;   INGUINAL HERNIA REPAIR Right ~ [place] N/A 06/21/2014   Procedure: LEFT HEART CATHETERIZATION WITH CORONARY ANGIOGRAM;  Surgeon: Larey Dresser, MD;  Location: [facility] CATH LAB;  Service:  Cardiovascular;  Laterality: N/A;   LOOP RECORDER INSERTION N/A 06/19/2016   Procedure: Loop Recorder Insertion;  Surgeon: Deboraha Sprang, MD;  Location: [place] [facility];  Service: Cardiovascular;  Laterality: N/A;   TEE WITHOUT CARDIOVERSION N/A 03/12/2016   Procedure: TRANSESOPHAGEAL ECHOCARDIOGRAM (TEE);  Surgeon: Sanda Klein, MD;  Location: [facility] ENDOSCOPY;  Service: Cardiovascular;  Laterality: N/A;     Inpatient Medications: Scheduled Meds:  aspirin  324 mg Oral Once   Continuous Infusions:  PRN Meds:   Allergies:    Allergies  Allergen Reactions   Imdur [Isosorbide Nitrate] Other (See Comments)    Headaches    Tramadol Other (See Comments)    Headaches     Social History:   Social History   Socioeconomic History   Marital status: Married    Spouse name: Not on file   Number of children: 5   Years of education: 12   Highest education level: Not on file  Occupational History  Occupation: Disabled, part-time Programmer, systems    Comment: Disability  Tobacco Use   Smoking status: Current Some Day Smoker    Packs/day: 0.25    Years: 23.00    Pack years: 5.75    Types: Cigarettes   Smokeless tobacco: Never Used   Tobacco comment: 3 to 4 per day  Vaping Use   Vaping Use: Never assessed  Substance and Sexual Activity   Alcohol use: Yes    Alcohol/week: 1.0 standard drink    Types: 1 Cans of beer per week    Comment: occasionally   Drug use: Yes    Types: Marijuana    Comment: 2 times/week   Sexual activity: Yes  Other Topics Concern   Not on file  Social History Narrative   Fun: Enjoy his children   Social Determinants of Health   Financial Resource Strain:    Difficulty of Paying Living Expenses: Not on file  Food Insecurity:    Worried About Charity fundraiser in the Last Year: Not on file   [employer] of Food in the Last Year: Not on file  Transportation Needs:    Lack of Transportation (Medical): Not on file   Lack of  Transportation (Non-Medical): Not on file  Physical Activity:    Days of Exercise per Week: Not on file   Minutes of Exercise per Session: Not on file  Stress:    Feeling of Stress : Not on file  Social Connections:    Frequency of Communication with Friends and Family: Not on file   Frequency of Social Gatherings with Friends and Family: Not on file   Attends Religious Services: Not on file   Active Member of Clubs or Organizations: Not on file   Attends Archivist Meetings: Not on file   Marital Status: Not on file  Intimate Partner Violence:    Fear of Current or Ex-Partner: Not on file   Emotionally Abused: Not on file   Physically Abused: Not on file   Sexually Abused: Not on file    Family History:    Family History  Problem Relation Age of Onset   Hypertension Mother    Hypertension Other    Colon cancer Paternal Uncle    Stroke Maternal Aunt    Heart attack Brother    Hypertension Brother    Diabetes Maternal Aunt    Lung cancer Maternal Uncle    Stroke Maternal Uncle    Other Sister        "breathing machine at night"   Headache Sister    Thyroid disease Sister    Throat cancer Neg Hx    Pancreatic cancer Neg Hx    Esophageal cancer Neg Hx    Kidney disease Neg Hx    Liver disease Neg Hx      ROS:  Please see the history of present illness.   All other ROS reviewed and negative.     Physical Exam/Data:   Vitals:   11/24/19 1630 11/24/19 1645 11/24/19 1700 11/24/19 1715  BP: (!) 159/98 (!) 161/92 (!) 162/92 (!) 167/82  Pulse: 83 85 81 82  Resp: 20 18 18 18   Temp:      TempSrc:      SpO2: 99% 99% 99% 99%   No intake or output data in the 24 hours ending 11/24/19 1919 Last 3 Weights 11/02/2019 10/09/2019 05/25/2019  Weight (lbs) 153 lb 156 lb 9.6 oz 163 lb  Weight (kg) 69.4 kg 71.033 kg  73.936 kg     There is no height or weight on file to calculate BMI.  General:   in no acute distress HEENT: normal Neck:  no JVD Cardiac:  normal S1, S2; RRR; 3/6 systolci murmur Lungs:  clear to auscultation bilaterally, no wheezing, rhonchi or rales  Abd: soft, nontender, no hepatomegaly  Ext: no edema Musculoskeletal:  No deformities, BUE and BLE strength normal and equal Skin: warm and dry  Neuro:  no focal abnormalities noted Psych:  Normal affect   EKG:  The EKG was personally reviewed and demonstrates:  sinus tachycardia, rate 133, LVH with repolarization abnormalities Telemetry:  Telemetry was personally reviewed and demonstrates:  Currently sinus rhythm in 80s  Relevant CV Studies:   Laboratory Data:  High Sensitivity Troponin:   Recent Labs  Lab 11/24/19 1300 11/24/19 1736  TROPONINIHS 59* 88*     Chemistry Recent Labs  Lab 11/24/19 1300  NA 141  K 3.9  CL 108  CO2 22  GLUCOSE 96  BUN 26*  CREATININE 3.09*  CALCIUM 9.3  GFRNONAA 23*  GFRAA 26*  ANIONGAP 11    No results for input(s): PROT, ALBUMIN, AST, ALT, ALKPHOS, BILITOT in the last 168 hours. Hematology Recent Labs  Lab 11/24/19 1300  WBC 5.4  RBC 4.33  HGB 13.5  HCT 42.6  MCV 98.4  MCH 31.2  MCHC 31.7  RDW 13.2  PLT 115*   BNPNo results for input(s): BNP, PROBNP in the last 168 hours.  DDimer No results for input(s): DDIMER in the last 168 hours.   Radiology/Studies:  DG Chest 2 View  Result Date: 11/24/2019 CLINICAL DATA:  Chest pain, shortness of breath. EXAM: CHEST - 2 VIEW COMPARISON:  09/24/2018 FINDINGS: Similar appearance of the chest with mildly enlarged cardiac silhouette, median sternotomy, and surgical aortic annulus and multi component stent endograft repair of the aorta. Similar mediastinal contour. Similar chronic blunting of the left costophrenic sulcus, likely related to scar. No pneumothorax. IMPRESSION: 1. No evidence of acute cardiopulmonary disease. 2. Similar cardiomegaly with extensive postoperative changes related to aortic annulus and multi component stent endograft repair of the  aorta. Electronically Signed   By: Margaretha Sheffield MD   On: 11/24/2019 13:31   CT Angio Chest/Abd/Pel for Dissection W and/or Wo Contrast  Result Date: 11/24/2019 CLINICAL DATA:  49 year old male with a history of thoracic aortic dissection status post repair with left upper chest pain. EXAM: CT ANGIOGRAPHY CHEST, ABDOMEN AND PELVIS TECHNIQUE: Non-contrast CT of the chest was initially obtained. Multidetector CT imaging through the chest, abdomen and pelvis was performed using the standard protocol during bolus administration of intravenous contrast. Multiplanar reconstructed images and MIPs were obtained and reviewed to evaluate the vascular anatomy. CONTRAST:  171mL OMNIPAQUE IOHEXOL 350 MG/ML SOLN COMPARISON:  Prior CT arteriogram 08/25/2018 FINDINGS: CTA CHEST FINDINGS Cardiovascular: Surgical changes of complex prior thoracic aortic repair with replaced aortic valve, deep branching and reimplantation of the branch arteries, [place] trunk graft repair of the ascending thoracic aorta, and stent graft repair of the transverse and descending thoracic aorta. No evidence of new dissection flap. The reimplanted brachiocephalic, left common carotid and left subclavian arteries are all widely patent. No evidence of dissection. The main pulmonary artery is normal in size. Stable cardiomegaly with marked concentric hypertrophy of the left ventricular myocardium. Mediastinum/Nodes: No enlarged mediastinal, hilar, or axillary lymph nodes. Thyroid gland, trachea, and esophagus demonstrate no significant findings. Lungs/Pleura: Lungs are clear. No pleural effusion or pneumothorax. Minimal chronic atelectasis  versus scarring in the left lower lobe and periphery of the lingula. Musculoskeletal: Healed median sternotomy. No acute osseous abnormality. Review of the MIP images confirms the above findings. CTA ABDOMEN AND PELVIS FINDINGS VASCULAR Aorta: Stable appearance of complex postsurgical changes including tube graft  repair of the visceral and juxtarenal abdominal aorta with probable reimplantation of the celiac axis and SMA. Additionally, there appears to be an aortic to distal renal bypass graft on the left. No evidence of complication or interval change. A circumscribed fluid collection adjacent to the repaired aortic segment remains stable at 3.6 x 3.0 cm. 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 Celiac: Reimplanted/grafted celiac artery remains widely patent. SMA: Reimplanted/grafted SMA remains widely patent. Renals: Reimplanted/graft to bilateral renal arteries remain patent. IMA: Patent without evidence of aneurysm, dissection, vasculitis or significant stenosis. 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 Veins: No focal venous abnormality. Review of the MIP images confirms the above findings. NON-VASCULAR Hepatobiliary: No focal liver abnormality is seen. No gallstones, gallbladder wall thickening, or biliary dilatation. Pancreas: Unremarkable. No pancreatic ductal dilatation or surrounding inflammatory changes. Spleen: Normal in size without focal abnormality. Adrenals/Urinary Tract: Adrenal glands are unremarkable. Kidneys are normal, without renal calculi, focal lesion, or hydronephrosis. Bladder is unremarkable. Stomach/Bowel: Stomach is within normal limits. Appendix appears normal. No evidence of bowel wall thickening, distention, or inflammatory changes. Lymphatic: No suspicious lymphadenopathy. Reproductive: Prostate is unremarkable. Other: No abdominal wall hernia or abnormality. No abdominopelvic ascites. Musculoskeletal: No acute or significant osseous findings. Review of the MIP images confirms the above findings. IMPRESSION: 1. Stable appearance of complex prior combined open and endovascular repair  of prior ascending thoracic and abdominal aortic dissection/aneurysms. Full details as above. No evidence of new dissection or acute complication. 2. Slight interval enlargement of the ectatic native distal infrarenal aorta to a maximum of 2.9 cm today compared to 2.7 cm previously. 3. Cardiomegaly with marked left ventricular hypertrophy. 4. No acute abnormality within the abdomen or pelvis. Electronically Signed   By: Jacqulynn Cadet M.D.   On: 11/24/2019 15:22   {  Assessment and Plan:   Chest pain: Description is atypical, as describes tearing pain on left side of chest for last several days, though does worsen with any exertion.  History of aortic dissection, but CTA on admission was unremarkable.  Also with history of CAD status post stent to ramus.  EKG shows LVH with repolarization abnormalities.  Troponin mildly elevated (59>88>87).  Currently chest pain-free.  Suspect demand ischemia in setting of uncontrolled hypertension - Continue aspirin and statin - Echocardiogram - Can evaluate for ischemia with Lexiscan Myoview.    Hypertension: On amlodipine 10 mg daily, carvedilol 25 mg twice daily, clonidine 0.3 mg patch, doxazosin 8 mg daily, minoxidil 10 mg twice daily.  Recently seen in hypertension clinic.  History of noncompliance.  Given questionable compliance, would start with home meds and titrate as needed for BP control  AKI on CKD:  creatinine 3.09 (down from 3.27 on 11/02/2019 but prior to that baseline appears to be about 2-2.2).  Closely monitor renal function as received IV contrast to rule out dissection on admission  Aortic dissection:status post Bentall in 2016.  No evidence of aortic dissection on CTA.  Hyperlipidemia: On Repatha, LDL 66 10/09/2019.   For questions or  updates, please contact [place] Please consult [email or web address] for contact info under    Signed, Donato Heinz, MD  11/24/2019 7:19 PM

## 2019-11-25 ENCOUNTER — Observation Stay (HOSPITAL_COMMUNITY): Payer: Medicare HMO

## 2019-11-25 ENCOUNTER — Other Ambulatory Visit: Payer: Self-pay

## 2019-11-25 DIAGNOSIS — I2510 Atherosclerotic heart disease of native coronary artery without angina pectoris: Secondary | ICD-10-CM | POA: Diagnosis present

## 2019-11-25 DIAGNOSIS — N189 Chronic kidney disease, unspecified: Secondary | ICD-10-CM | POA: Diagnosis not present

## 2019-11-25 DIAGNOSIS — R079 Chest pain, unspecified: Secondary | ICD-10-CM

## 2019-11-25 DIAGNOSIS — E785 Hyperlipidemia, unspecified: Secondary | ICD-10-CM | POA: Diagnosis present

## 2019-11-25 DIAGNOSIS — Z952 Presence of prosthetic heart valve: Secondary | ICD-10-CM

## 2019-11-25 DIAGNOSIS — I130 Hypertensive heart and chronic kidney disease with heart failure and stage 1 through stage 4 chronic kidney disease, or unspecified chronic kidney disease: Secondary | ICD-10-CM | POA: Diagnosis present

## 2019-11-25 DIAGNOSIS — I252 Old myocardial infarction: Secondary | ICD-10-CM | POA: Diagnosis not present

## 2019-11-25 DIAGNOSIS — R109 Unspecified abdominal pain: Secondary | ICD-10-CM | POA: Diagnosis present

## 2019-11-25 DIAGNOSIS — F329 Major depressive disorder, single episode, unspecified: Secondary | ICD-10-CM | POA: Diagnosis present

## 2019-11-25 DIAGNOSIS — F17210 Nicotine dependence, cigarettes, uncomplicated: Secondary | ICD-10-CM | POA: Diagnosis present

## 2019-11-25 DIAGNOSIS — D696 Thrombocytopenia, unspecified: Secondary | ICD-10-CM | POA: Diagnosis present

## 2019-11-25 DIAGNOSIS — Z9889 Other specified postprocedural states: Secondary | ICD-10-CM | POA: Diagnosis not present

## 2019-11-25 DIAGNOSIS — R0789 Other chest pain: Secondary | ICD-10-CM | POA: Diagnosis not present

## 2019-11-25 DIAGNOSIS — N184 Chronic kidney disease, stage 4 (severe): Secondary | ICD-10-CM | POA: Diagnosis present

## 2019-11-25 DIAGNOSIS — Z8673 Personal history of transient ischemic attack (TIA), and cerebral infarction without residual deficits: Secondary | ICD-10-CM | POA: Diagnosis not present

## 2019-11-25 DIAGNOSIS — N179 Acute kidney failure, unspecified: Secondary | ICD-10-CM | POA: Diagnosis present

## 2019-11-25 DIAGNOSIS — I429 Cardiomyopathy, unspecified: Secondary | ICD-10-CM | POA: Diagnosis present

## 2019-11-25 DIAGNOSIS — R011 Cardiac murmur, unspecified: Secondary | ICD-10-CM | POA: Diagnosis present

## 2019-11-25 DIAGNOSIS — I7100 Dissection of unspecified site of aorta: Secondary | ICD-10-CM | POA: Diagnosis present

## 2019-11-25 DIAGNOSIS — I5042 Chronic combined systolic (congestive) and diastolic (congestive) heart failure: Secondary | ICD-10-CM | POA: Diagnosis present

## 2019-11-25 DIAGNOSIS — Z8249 Family history of ischemic heart disease and other diseases of the circulatory system: Secondary | ICD-10-CM | POA: Diagnosis not present

## 2019-11-25 DIAGNOSIS — J329 Chronic sinusitis, unspecified: Secondary | ICD-10-CM | POA: Diagnosis present

## 2019-11-25 DIAGNOSIS — M5136 Other intervertebral disc degeneration, lumbar region: Secondary | ICD-10-CM | POA: Diagnosis present

## 2019-11-25 DIAGNOSIS — I10 Essential (primary) hypertension: Secondary | ICD-10-CM | POA: Diagnosis not present

## 2019-11-25 DIAGNOSIS — J449 Chronic obstructive pulmonary disease, unspecified: Secondary | ICD-10-CM | POA: Diagnosis present

## 2019-11-25 DIAGNOSIS — G8929 Other chronic pain: Secondary | ICD-10-CM | POA: Diagnosis present

## 2019-11-25 DIAGNOSIS — F419 Anxiety disorder, unspecified: Secondary | ICD-10-CM | POA: Diagnosis present

## 2019-11-25 DIAGNOSIS — K219 Gastro-esophageal reflux disease without esophagitis: Secondary | ICD-10-CM | POA: Diagnosis present

## 2019-11-25 DIAGNOSIS — Z86010 Personal history of colonic polyps: Secondary | ICD-10-CM | POA: Diagnosis not present

## 2019-11-25 DIAGNOSIS — Z20822 Contact with and (suspected) exposure to covid-19: Secondary | ICD-10-CM | POA: Diagnosis present

## 2019-11-25 DIAGNOSIS — I160 Hypertensive urgency: Secondary | ICD-10-CM | POA: Diagnosis present

## 2019-11-25 LAB — ECHOCARDIOGRAM COMPLETE
AR max vel: 2.25 cm2
AV Area VTI: 2.01 cm2
AV Area mean vel: 1.94 cm2
AV Mean grad: 18 mmHg
AV Peak grad: 31.8 mmHg
Ao pk vel: 2.82 m/s
Area-P 1/2: 2.97 cm2
S' Lateral: 2.5 cm

## 2019-11-25 LAB — CBC
HCT: 36.4 % — ABNORMAL LOW (ref 39.0–52.0)
Hemoglobin: 11.9 g/dL — ABNORMAL LOW (ref 13.0–17.0)
MCH: 30.9 pg (ref 26.0–34.0)
MCHC: 32.7 g/dL (ref 30.0–36.0)
MCV: 94.5 fL (ref 80.0–100.0)
Platelets: 107 10*3/uL — ABNORMAL LOW (ref 150–400)
RBC: 3.85 MIL/uL — ABNORMAL LOW (ref 4.22–5.81)
RDW: 13.4 % (ref 11.5–15.5)
WBC: 4.3 10*3/uL (ref 4.0–10.5)
nRBC: 0 % (ref 0.0–0.2)

## 2019-11-25 LAB — BASIC METABOLIC PANEL
Anion gap: 10 (ref 5–15)
BUN: 25 mg/dL — ABNORMAL HIGH (ref 6–20)
CO2: 20 mmol/L — ABNORMAL LOW (ref 22–32)
Calcium: 8.9 mg/dL (ref 8.9–10.3)
Chloride: 110 mmol/L (ref 98–111)
Creatinine, Ser: 2.93 mg/dL — ABNORMAL HIGH (ref 0.61–1.24)
GFR calc Af Amer: 28 mL/min — ABNORMAL LOW (ref >60)
GFR calc non Af Amer: 24 mL/min — ABNORMAL LOW (ref >60)
Glucose, Bld: 80 mg/dL (ref 70–99)
Potassium: 3.7 mmol/L (ref 3.5–5.1)
Sodium: 140 mmol/L (ref 135–145)

## 2019-11-25 LAB — NM MYOCAR MULTI W/SPECT W/WALL MOTION / EF
MPHR: 173 {beats}/min
Peak HR: 125 {beats}/min
Percent HR: 72 %
Rest HR: 88 {beats}/min

## 2019-11-25 LAB — HIV ANTIBODY (ROUTINE TESTING W REFLEX): HIV Screen 4th Generation wRfx: NONREACTIVE

## 2019-11-25 LAB — TROPONIN I (HIGH SENSITIVITY): Troponin I (High Sensitivity): 78 ng/L — ABNORMAL HIGH (ref <18)

## 2019-11-25 MED ORDER — REGADENOSON 0.4 MG/5ML IV SOLN
INTRAVENOUS | Status: AC
  Filled 2019-11-25: qty 5

## 2019-11-25 MED ORDER — TECHNETIUM TC 99M TETROFOSMIN IV KIT
30.0000 | PACK | Freq: Once | INTRAVENOUS | Status: AC | PRN
  Administered 2019-11-25: 30 via INTRAVENOUS

## 2019-11-25 MED ORDER — HYDROMORPHONE HCL 1 MG/ML IJ SOLN
0.5000 mg | Freq: Once | INTRAMUSCULAR | Status: DC
  Filled 2019-11-25: qty 1

## 2019-11-25 MED ORDER — TECHNETIUM TC 99M TETROFOSMIN IV KIT
10.0000 | PACK | Freq: Once | INTRAVENOUS | Status: AC | PRN
  Administered 2019-11-25: 10 via INTRAVENOUS

## 2019-11-25 MED ORDER — REGADENOSON 0.4 MG/5ML IV SOLN
0.4000 mg | Freq: Once | INTRAVENOUS | Status: AC
  Administered 2019-11-25: 0.4 mg via INTRAVENOUS
  Filled 2019-11-25: qty 5

## 2019-11-25 NOTE — Progress Notes (Signed)
   [name] presented for a nuclear stress test today.  No immediate complications.  Stress imaging is pending at this time.  Preliminary EKG findings may be listed in the chart, but the stress test result will not be finalized until perfusion imaging is complete.  Tami Lin Rual Vermeer, PA-C 11/25/2019, 10:54 AM

## 2019-11-25 NOTE — Progress Notes (Signed)
Progress Note    [name]  UMP:[id number] DOB: 07/11/1970  DOA: 11/24/2019 PCP: Kerin Perna, NP    Brief Narrative:     Medical records reviewed and are as summarized below:  [name] is an 49 y.o. 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  Patient reports intermittent chest pain since 11/20/2019, severe at times, tearing in character, and radiating from his left chest into his abdomen and back.  Patient is concerned that the symptoms are similar to what he was experiencing when his aorta dissected.  Symptoms seem to be worse with activity and better with rest.  Assessment/Plan:   Principal Problem:   Chest pain Active Problems:   Uncontrolled hypertension   CKD (chronic kidney disease), stage IV (HCC)   Cardiomyopathy (HCC)   S/P aortic dissection repair   History of stroke   COPD (chronic obstructive pulmonary disease) (HCC)   Chest pain; CAD  - Presents with 5 days of chest pain, had no acute findings on dissection study and mildly elevated troponin  - echo done -nuclear medicine study pending -cards consult appreciated   Hypertensive urgency  -BP elevated this AM -patient c/o blurry vision and headache -vision improved with lowering of BP -monitor headache after analgesia   Aortic dissection s/p repair  - No acute findings on dissection study    CKD IIIb-IV  - SCr is 3.09 on admission, down from 3.27 three weeks ago but had been in 2.1 range prior to that  - He received IV contrast in ED  -this AM down to 2.93   History of CVA  - Continue ASA and statin    COPD  - No cough or wheezing on admission  -  Continue inhalers       Family Communication/Anticipated D/C date and plan/Code Status   DVT prophylaxis: Lovenox ordered. Code Status: Full Code.  Disposition Plan: Status is: Observation  The patient will require care spanning > 2 midnights and should be moved to inpatient because: Ongoing active pain requiring inpatient pain management  Dispo: The patient is from: Home              Anticipated d/c is to: Home              Anticipated d/c date is: 1 day              Patient currently is not medically stable to d/c.- await stress test results and improvement in BP         Medical Consultants:    cards  Subjective:   C/o headache  Objective:    Vitals:   11/25/19 1031 11/25/19 1032 11/25/19 1034 11/25/19 1147  BP: (!) 152/107 (!) 211/114 (!) 206/118 (!) 152/91  Pulse:    99  Resp:    16  Temp:    98.4 F (36.9 C)  TempSrc:    Oral  SpO2:    98%   No intake or output data in the 24 hours ending 11/25/19 1223 There were no vitals filed for this visit.  Exam:  General: Appearance:    Well developed, well nourished male in no acute distress  Lungs:    respirations unlabored  Heart:    Normal heart rate. Normal rhythm.  +murmur  MS:   All extremities are intact.   Neurologic:   Awake, alert, oriented x 3. No apparent focal neurological           defect.     Data Reviewed:   I have personally reviewed following labs and imaging studies:  Labs: Labs show the following:   Basic Metabolic Panel: Recent Labs  Lab 11/24/19 1300 11/25/19 0317  NA 141 140  K 3.9 3.7  CL 108 110  CO2 22 20*  GLUCOSE 96 80  BUN 26* 25*  CREATININE 3.09* 2.93*  CALCIUM 9.3 8.9   GFR CrCl cannot be calculated (Unknown ideal weight.). Liver Function Tests: No results for input(s): AST, ALT, ALKPHOS, BILITOT, PROT, ALBUMIN in the last 168 hours. No results for input(s): LIPASE, AMYLASE in the last 168 hours. No results for input(s): AMMONIA in the last 168  hours. Coagulation profile No results for input(s): INR, PROTIME in the last 168 hours.  CBC: Recent Labs  Lab 11/24/19 1300 11/25/19 0317  WBC 5.4 4.3  HGB 13.5 11.9*  HCT 42.6 36.4*  MCV 98.4 94.5  PLT 115* 107*   Cardiac Enzymes: No results for input(s): CKTOTAL, CKMB, CKMBINDEX, TROPONINI in the last 168 hours. BNP (last 3 results) No results for input(s): PROBNP in the last 8760 hours. CBG: No results for input(s): GLUCAP in the last 168 hours. D-Dimer: No results for input(s): DDIMER in the last 72 hours. Hgb A1c: No results for input(s): HGBA1C in the last 72 hours. Lipid Profile: No results for input(s): CHOL, HDL, LDLCALC, TRIG, CHOLHDL, LDLDIRECT in the last 72 hours. Thyroid function studies: No results for input(s): TSH, T4TOTAL, T3FREE, THYROIDAB in the last 72 hours.  Invalid input(s): FREET3 Anemia work up: No results for input(s): VITAMINB12, FOLATE, FERRITIN, TIBC, IRON, RETICCTPCT in the last 72 hours. Sepsis Labs: Recent Labs  Lab 11/24/19 1300 11/25/19 0317  WBC 5.4 4.3    Microbiology Recent Results (from the past 240 hour(s))  SARS Coronavirus 2 by RT PCR (hospital order, performed in [facility] hospital lab) Nasopharyngeal Nasopharyngeal Swab     Status: None   Collection Time: 11/24/19  9:40 PM   Specimen: Nasopharyngeal Swab  Result Value Ref Range Status   SARS Coronavirus 2 NEGATIVE NEGATIVE Final    Comment: (NOTE) SARS-CoV-2 target nucleic acids are NOT DETECTED.  The SARS-CoV-2 RNA is generally detectable in upper and lower respiratory specimens during the acute phase of infection. The lowest concentration of SARS-CoV-2 viral copies this assay can detect is 250 copies / mL. A negative result does not preclude SARS-CoV-2 infection and should not be used as the sole basis for treatment or other patient management decisions.  A negative result may occur with improper specimen collection / handling, submission of specimen other than  nasopharyngeal swab, presence of viral mutation(s) within the areas targeted by this assay, and inadequate number of viral copies (<250 copies / mL). A negative result must be combined with clinical observations, patient history, and epidemiological information.  Fact Sheet for Patients:   StrictlyIdeas.no  Fact Sheet for Healthcare Providers: BankingDealers.co.za  This test is not yet approved or  cleared by the [place] FDA and has been authorized for detection and/or diagnosis of SARS-CoV-2 by FDA under an Emergency Use Authorization (EUA).  This EUA will remain in effect (meaning this test can be used) for the duration of the  COVID-19 declaration under Section 564(b)(1) of the Act, 21 U.S.C. section 360bbb-3(b)(1), unless the authorization is terminated or revoked sooner.  Performed at [facility], [place] [address] [place], [place] [address]     Procedures and diagnostic studies:  DG Chest 2 View  Result Date: 11/24/2019 CLINICAL DATA:  Chest pain, shortness of breath. EXAM: CHEST - 2 VIEW COMPARISON:  09/24/2018 FINDINGS: Similar appearance of the chest with mildly enlarged cardiac silhouette, median sternotomy, and surgical aortic annulus and multi component stent endograft repair of the aorta. Similar mediastinal contour. Similar chronic blunting of the left costophrenic sulcus, likely related to scar. No pneumothorax. IMPRESSION: 1. No evidence of acute cardiopulmonary disease. 2. Similar cardiomegaly with extensive postoperative changes related to aortic annulus and multi component stent endograft repair of the aorta. Electronically Signed   By: Margaretha Sheffield MD   On: 11/24/2019 13:31   ECHOCARDIOGRAM COMPLETE  Result Date: 11/25/2019    ECHOCARDIOGRAM REPORT   Patient Name:   [name] Date of Exam: 11/25/2019 Medical Rec #:  [id number]     Height:       71.0 in Accession #:    [id number]    Weight:       153.0  lb Date of Birth:  Sep 26, 1970      BSA:          1.882 m Patient Age:    38 years      BP:           169/98 mmHg Patient Gender: M             HR:           96 bpm. Exam Location:  Inpatient Procedure: 2D Echo, 3D Echo and Strain Analysis Indications:    Chest Pain 786.50 / R07.9  History:        Patient has prior history of Echocardiogram examinations, most                 recent 09/25/2018. CHF CAD Aortic Valve Disease and Mitral                 Valve Disease Risk Factors: Current Smoker, Hypertension and                 Dyslipidemia. Hx of aortic dissection and arch replacement.                 Alcohol abuse. CKD.                 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 Procedure Date: 10/03/2014.  Sonographer:    Darlina Sicilian RDCS Referring Phys: [id number] [place]  1. Left ventricular ejection fraction, by estimation, is 60 to 65%. The left ventricle has normal function. The left ventricle has no regional wall motion abnormalities. There is severe concentric left ventricular hypertrophy. Left ventricular diastolic  parameters are consistent with Grade I diastolic dysfunction (impaired relaxation). Elevated left ventricular end-diastolic pressure.  2. Right ventricular systolic function is normal. The right ventricular size is normal. Tricuspid regurgitation signal is inadequate for assessing PA pressure.  3. Left atrial size was mildly dilated.  4. The mitral valve is normal in  structure. No evidence of mitral valve regurgitation. No evidence of mitral stenosis.  5. The aortic valve is normal in structure. Aortic valve regurgitation is not visualized. No aortic stenosis is present. 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 Procedure Date: 10/03/2014. Echo findings are  consistent with normal structure and function of the aortic valve prosthesis. Aortic valve area, by VTI measures 2.01 cm. Aortic valve mean gradient measures 18.0 mmHg. Aortic valve Vmax measures 2.82 m/s.  6. Aortic dilatation noted. There is mild dilatation of the ascending aorta, measuring 40 mm.  7. The inferior vena cava is normal in size with greater than 50% respiratory variability, suggesting right atrial pressure of 3 mmHg. FINDINGS  Left Ventricle: Left ventricular ejection fraction, by estimation, is 60 to 65%. The left ventricle has normal function. The left ventricle has no regional wall motion abnormalities. Global longitudinal strain performed but not reported based on interpreter judgement due to suboptimal tracking. The left ventricular internal cavity size was normal in size. There is severe concentric left ventricular hypertrophy. Left ventricular diastolic parameters are consistent with Grade I diastolic dysfunction (impaired relaxation). Elevated left ventricular end-diastolic pressure. Right Ventricle: The right ventricular size is normal. No increase in right ventricular wall thickness. Right ventricular systolic function is normal. Tricuspid regurgitation signal is inadequate for assessing PA pressure. Left Atrium: Left atrial size was mildly dilated. Right Atrium: Right atrial size was normal in size. Pericardium: There is no evidence of pericardial effusion. Mitral Valve: The mitral valve is normal in structure. No evidence of mitral valve regurgitation. No evidence of mitral valve stenosis. Tricuspid Valve: The tricuspid valve is normal in structure. Tricuspid valve regurgitation is trivial. No evidence of tricuspid stenosis. Aortic Valve: The aortic valve is normal in structure. Aortic valve regurgitation is not visualized. No aortic stenosis is present. Aortic valve mean gradient measures 18.0 mmHg. Aortic valve peak gradient measures 31.8 mmHg. Aortic valve area, by VTI measures 2.01  cm. 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 Procedure Date: 10/03/2014. Echo findings are consistent with normal structure and function of the  aortic valve prosthesis. Pulmonic Valve: The pulmonic valve was normal in structure. Pulmonic valve regurgitation is not visualized. No evidence of pulmonic stenosis. Aorta: Aortic dilatation noted. There is mild dilatation of the ascending aorta, measuring 40 mm. Venous: The inferior vena cava is normal in size with greater than 50% respiratory variability, suggesting right atrial pressure of 3 mmHg. IAS/Shunts: No atrial level shunt detected by color flow Doppler.  LEFT VENTRICLE PLAX 2D LVIDd:         4.00 cm  Diastology LVIDs:         2.50 cm  LV e' medial:    3.57 cm/s LV PW:         1.70 cm  LV E/e' medial:  16.0 LV IVS:        1.90 cm  LV e' lateral:   3.37 cm/s LVOT diam:     2.40 cm  LV E/e' lateral: 16.9 LV SV:         93 LV SV Index:   49 LVOT Area:     4.52 cm  RIGHT VENTRICLE RV S prime:     10.60 cm/s TAPSE (M-mode): 2.1 cm LEFT ATRIUM             Index  RIGHT ATRIUM           Index LA diam:        5.40 cm 2.87 cm/m  RA Area:     15.30 cm LA Vol (A2C):   69.9 ml 37.15 ml/m RA Volume:   31.80 ml  16.90 ml/m LA Vol (A4C):   76.3 ml 40.55 ml/m LA Biplane Vol: 75.3 ml 40.02 ml/m  AORTIC VALVE AV Area (Vmax):    2.25 cm AV Area (Vmean):   1.94 cm AV Area (VTI):     2.01 cm AV Vmax:           282.00 cm/s AV Vmean:          198.500 cm/s AV VTI:            0.460 m AV Peak Grad:      31.8 mmHg AV Mean Grad:      18.0 mmHg LVOT Vmax:         140.00 cm/s LVOT Vmean:        85.300 cm/s LVOT VTI:          0.205 m LVOT/AV VTI ratio: 0.45  AORTA Ao Root diam: 3.40 cm Ao Asc diam:  4.00 cm MITRAL VALVE MV Area (PHT): 2.97 cm    SHUNTS MV Decel Time: 255 msec    Systemic VTI:  0.20 m MV E velocity: 57.00 cm/s  Systemic Diam: 2.40 cm MV A velocity: 95.10 cm/s MV E/A  ratio:  0.60 Fransico Him MD Electronically signed by Fransico Him MD Signature Date/Time: 11/25/2019/9:25:38 AM    Final    CT Angio Chest/Abd/Pel for Dissection W and/or Wo Contrast  Result Date: 11/24/2019 CLINICAL DATA:  49 year old male with a history of thoracic aortic dissection status post repair with left upper chest pain. EXAM: CT ANGIOGRAPHY CHEST, ABDOMEN AND PELVIS TECHNIQUE: Non-contrast CT of the chest was initially obtained. Multidetector CT imaging through the chest, abdomen and pelvis was performed using the standard protocol during bolus administration of intravenous contrast. Multiplanar reconstructed images and MIPs were obtained and reviewed to evaluate the vascular anatomy. CONTRAST:  182mL OMNIPAQUE IOHEXOL 350 MG/ML SOLN COMPARISON:  Prior CT arteriogram 08/25/2018 FINDINGS: CTA CHEST FINDINGS Cardiovascular: Surgical changes of complex prior thoracic aortic repair with replaced aortic valve, deep branching and reimplantation of the branch arteries, [place]phant trunk graft repair of the ascending thoracic aorta, and stent graft repair of the transverse and descending thoracic aorta. No evidence of new dissection flap. The reimplanted brachiocephalic, left common carotid and left subclavian arteries are all widely patent. No evidence of dissection. The main pulmonary artery is normal in size. Stable cardiomegaly with marked concentric hypertrophy of the left ventricular myocardium. Mediastinum/Nodes: No enlarged mediastinal, hilar, or axillary lymph nodes. Thyroid gland, trachea, and esophagus demonstrate no significant findings. Lungs/Pleura: Lungs are clear. No pleural effusion or pneumothorax. Minimal chronic atelectasis versus scarring in the left lower lobe and periphery of the lingula. Musculoskeletal: Healed median sternotomy. No acute osseous abnormality. Review of the MIP images confirms the above findings. CTA ABDOMEN AND PELVIS FINDINGS VASCULAR Aorta: Stable appearance of complex  postsurgical changes including tube graft repair of the visceral and juxtarenal abdominal aorta with probable reimplantation of the celiac axis and SMA. Additionally, there appears to be an aortic to distal renal bypass graft on the left. No evidence of complication or interval change. A circumscribed fluid collection adjacent to the repaired aortic segment remains stable at 3.6 x 3.0 cm. 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 Celiac: Reimplanted/grafted celiac artery remains widely patent. SMA: Reimplanted/grafted SMA remains widely patent. Renals: Reimplanted/graft to bilateral renal arteries remain patent. IMA: Patent without evidence of aneurysm, dissection, vasculitis or significant stenosis. 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 Veins: No focal venous abnormality. Review of the MIP images confirms the above findings. NON-VASCULAR Hepatobiliary: No focal liver abnormality is seen. No gallstones, gallbladder wall thickening, or biliary dilatation. Pancreas: Unremarkable. No pancreatic ductal dilatation or surrounding inflammatory changes. Spleen: Normal in size without focal abnormality. Adrenals/Urinary Tract: Adrenal glands are unremarkable. Kidneys are normal, without renal calculi, focal lesion, or hydronephrosis. Bladder is unremarkable. Stomach/Bowel: Stomach is within normal limits. Appendix appears normal. No evidence of bowel wall thickening, distention, or inflammatory changes. Lymphatic: No suspicious lymphadenopathy. Reproductive: Prostate is unremarkable. Other: No abdominal wall hernia or abnormality. No abdominopelvic ascites. Musculoskeletal: No acute or significant osseous findings. Review of the MIP images confirms the above findings. IMPRESSION: 1. Stable appearance of complex  prior combined open and endovascular repair of prior ascending thoracic and abdominal aortic dissection/aneurysms. Full details as above. No evidence of new dissection or acute complication. 2. Slight interval enlargement of the ectatic native distal infrarenal aorta to a maximum of 2.9 cm today compared to 2.7 cm previously. 3. Cardiomegaly with marked left ventricular hypertrophy. 4. No acute abnormality within the abdomen or pelvis. Electronically Signed   By: Jacqulynn Cadet M.D.   On: 11/24/2019 15:22    Medications:    amitriptyline  50 mg Oral QHS   amLODipine  10 mg Oral Daily   arformoterol  15 mcg Nebulization BID   aspirin EC  81 mg Oral Daily   carvedilol  25 mg Oral BID WC   [START ON 11/26/2019] cloNIDine  0.3 mg Transdermal Weekly   doxazosin  8 mg Oral QHS   heparin  5,000 Units Subcutaneous Q8H    HYDROmorphone (DILAUDID) injection  0.5 mg Intravenous Once   minoxidil  10 mg Oral BID   pantoprazole  40 mg Oral Daily   regadenoson       rosuvastatin  40 mg Oral Daily   sodium chloride flush  3 mL Intravenous Q12H   umeclidinium bromide  1 puff Inhalation Daily   Continuous Infusions:  sodium chloride       LOS: 0 days   Geradine Girt  [facility]   How to contact the [facility] Attending or Consulting provider [place] or covering provider during after hours [place], for this patient?  1. Check the care team in [facility] and look for a) attending/consulting [facility] provider listed and b) the [facility] team listed 2. Log into [email or web address] and use [place]'s universal password to access. If you do not have the password, please contact the hospital operator. 3. Locate the [facility] provider you are looking for under [facility] and page to a number that you can be directly reached. 4. If you still have difficulty reaching the provider, please page the [facility] (Director on Call) for the Hospitalists listed on amion for assistance.  11/25/2019, 12:23 PM

## 2019-11-25 NOTE — Progress Notes (Signed)
Nuclear stress test was read by Radiology and read out as inferior infarct and no ischemia with moderate LV dysfunction.  I have personally reviewed the images and there is a fixed defect in the inferolateral and inferior walls that is fixed.  The gated images show normal LVF.  An inferior infarct was called but in setting of normal LVF both on nuc and echo, this defect is consistent with diaphragmatic attenuation artifact.  No ischemia.

## 2019-11-25 NOTE — Progress Notes (Signed)
Subjective:  Aching in chest and back Just back from echo has not had myovue yet   Objective:  Vitals:   11/25/19 0000 11/25/19 0030 11/25/19 0202 11/25/19 0514  BP: (!) 153/84 (!) 160/101 (!) 173/113 (!) 169/98  Pulse: 97 90 97 96  Resp: 15 15 20 20   Temp:   98.5 F (36.9 C)   TempSrc:   Oral   SpO2: 97% 97% 100% 96%    Intake/Output from previous day: No intake or output data in the 24 hours ending 11/25/19 0847  Physical Exam: Affect appropriate Chronically ill black male  HEENT: normal Neck supple with no adenopathy JVP normal no bruits no thyromegaly Lungs clear with no wheezing and good diaphragmatic motion Heart:  S1/S2 loud SEM  murmur, no rub, gallop or click PMI normal post sternotomy  Abdomen: benighn, BS positve, no tenderness, no AAA no bruit.  No HSM or HJR Distal pulses intact with no bruits No edema Neuro non-focal Skin warm and dry No muscular weakness   Lab Results: Basic Metabolic Panel: Recent Labs    11/24/19 1300 11/25/19 0317  NA 141 140  K 3.9 3.7  CL 108 110  CO2 22 20*  GLUCOSE 96 80  BUN 26* 25*  CREATININE 3.09* 2.93*  CALCIUM 9.3 8.9   Liver Function Tests: No results for input(s): AST, ALT, ALKPHOS, BILITOT, PROT, ALBUMIN in the last 72 hours. No results for input(s): LIPASE, AMYLASE in the last 72 hours. CBC: Recent Labs    11/24/19 1300 11/25/19 0317  WBC 5.4 4.3  HGB 13.5 11.9*  HCT 42.6 36.4*  MCV 98.4 94.5  PLT 115* 107*   Anemia Panel: No results for input(s): VITAMINB12, FOLATE, FERRITIN, TIBC, IRON, RETICCTPCT in the last 72 hours.  Imaging: DG Chest 2 View  Result Date: 11/24/2019 CLINICAL DATA:  Chest pain, shortness of breath. EXAM: CHEST - 2 VIEW COMPARISON:  09/24/2018 FINDINGS: Similar appearance of the chest with mildly enlarged cardiac silhouette, median sternotomy, and surgical aortic annulus and multi component stent endograft repair of the aorta. Similar mediastinal contour. Similar chronic  blunting of the left costophrenic sulcus, likely related to scar. No pneumothorax. IMPRESSION: 1. No evidence of acute cardiopulmonary disease. 2. Similar cardiomegaly with extensive postoperative changes related to aortic annulus and multi component stent endograft repair of the aorta. Electronically Signed   By: Margaretha Sheffield MD   On: 11/24/2019 13:31   CT Angio Chest/Abd/Pel for Dissection W and/or Wo Contrast  Result Date: 11/24/2019 CLINICAL DATA:  49 year old male with a history of thoracic aortic dissection status post repair with left upper chest pain. EXAM: CT ANGIOGRAPHY CHEST, ABDOMEN AND PELVIS TECHNIQUE: Non-contrast CT of the chest was initially obtained. Multidetector CT imaging through the chest, abdomen and pelvis was performed using the standard protocol during bolus administration of intravenous contrast. Multiplanar reconstructed images and MIPs were obtained and reviewed to evaluate the vascular anatomy. CONTRAST:  157mL OMNIPAQUE IOHEXOL 350 MG/ML SOLN COMPARISON:  Prior CT arteriogram 08/25/2018 FINDINGS: CTA CHEST FINDINGS Cardiovascular: Surgical changes of complex prior thoracic aortic repair with replaced aortic valve, deep branching and reimplantation of the branch arteries, [place] trunk graft repair of the ascending thoracic aorta, and stent graft repair of the transverse and descending thoracic aorta. No evidence of new dissection flap. The reimplanted brachiocephalic, left common carotid and left subclavian arteries are all widely patent. No evidence of dissection. The main pulmonary artery is normal in size. Stable cardiomegaly with marked concentric hypertrophy of  the left ventricular myocardium. Mediastinum/Nodes: No enlarged mediastinal, hilar, or axillary lymph nodes. Thyroid gland, trachea, and esophagus demonstrate no significant findings. Lungs/Pleura: Lungs are clear. No pleural effusion or pneumothorax. Minimal chronic atelectasis versus scarring in the left lower  lobe and periphery of the lingula. Musculoskeletal: Healed median sternotomy. No acute osseous abnormality. Review of the MIP images confirms the above findings. CTA ABDOMEN AND PELVIS FINDINGS VASCULAR Aorta: Stable appearance of complex postsurgical changes including tube graft repair of the visceral and juxtarenal abdominal aorta with probable reimplantation of the celiac axis and SMA. Additionally, there appears to be an aortic to distal renal bypass graft on the left. No evidence of complication or interval change. A circumscribed fluid collection adjacent to the repaired aortic segment remains stable at 3.6 x 3.0 cm. 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 Celiac: Reimplanted/grafted celiac artery remains widely patent. SMA: Reimplanted/grafted SMA remains widely patent. Renals: Reimplanted/graft to bilateral renal arteries remain patent. IMA: Patent without evidence of aneurysm, dissection, vasculitis or significant stenosis. 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 Veins: No focal venous abnormality. Review of the MIP images confirms the above findings. NON-VASCULAR Hepatobiliary: No focal liver abnormality is seen. No gallstones, gallbladder wall thickening, or biliary dilatation. Pancreas: Unremarkable. No pancreatic ductal dilatation or surrounding inflammatory changes. Spleen: Normal in size without focal abnormality. Adrenals/Urinary Tract: Adrenal glands are unremarkable. Kidneys are normal, without renal calculi, focal lesion, or hydronephrosis. Bladder is unremarkable. Stomach/Bowel: Stomach is within normal limits. Appendix appears normal. No evidence of bowel wall thickening, distention, or inflammatory changes. Lymphatic: No suspicious lymphadenopathy. Reproductive: Prostate is  unremarkable. Other: No abdominal wall hernia or abnormality. No abdominopelvic ascites. Musculoskeletal: No acute or significant osseous findings. Review of the MIP images confirms the above findings. IMPRESSION: 1. Stable appearance of complex prior combined open and endovascular repair of prior ascending thoracic and abdominal aortic dissection/aneurysms. Full details as above. No evidence of new dissection or acute complication. 2. Slight interval enlargement of the ectatic native distal infrarenal aorta to a maximum of 2.9 cm today compared to 2.7 cm previously. 3. Cardiomegaly with marked left ventricular hypertrophy. 4. No acute abnormality within the abdomen or pelvis. Electronically Signed   By: Jacqulynn Cadet M.D.   On: 11/24/2019 15:22    Cardiac Studies:  ECG: SR LVH   Telemetry:  NSR   Echo: pending today   Medications:   . amitriptyline  50 mg Oral QHS  . amLODipine  10 mg Oral Daily  . arformoterol  15 mcg Nebulization BID  . aspirin EC  81 mg Oral Daily  . carvedilol  25 mg Oral BID WC  . [START ON 11/26/2019] cloNIDine  0.3 mg Transdermal Weekly  . doxazosin  8 mg Oral QHS  . heparin  5,000 Units Subcutaneous Q8H  . minoxidil  10 mg Oral BID  . pantoprazole  40 mg Oral Daily  . regadenoson  0.4 mg Intravenous Once  . rosuvastatin  40 mg Oral Daily  . sodium chloride flush  3 mL Intravenous Q12H  . umeclidinium bromide  1 puff Inhalation Daily     . sodium chloride    . sodium chloride 90 mL/hr at 11/25/19 0235    Assessment/Plan:   1. Chest Pain.  ECG with LVH strain troponin mildly elevated  with no trend in setting of CRF not significant Will review echo Dr Nechama Guard has written for myovue today with Cr 2.93 study would have to be very high risk to consider cath. He has imdur listed as allergy but only headache. Not sure why he is not on hydralazine as opposed to clonidine and other 3 rd tier meds.   2. Bental:  Has loud murmur through AV echo pending Aortic  dissection in 2016 with PCI ramus and bioprosthetic AVR No AR murmur on exam Last cath 09/26/18 with patent ramus stent and 60% ostial D1 lesion Suspect he will be d/c in am with medical Rx  Jenkins Rouge 11/25/2019, 8:47 AM

## 2019-11-25 NOTE — Progress Notes (Signed)
  Echocardiogram 2D Echocardiogram has been performed.  Darlina Sicilian M 11/25/2019, 8:22 AM

## 2019-11-26 DIAGNOSIS — N189 Chronic kidney disease, unspecified: Secondary | ICD-10-CM

## 2019-11-26 DIAGNOSIS — I160 Hypertensive urgency: Secondary | ICD-10-CM

## 2019-11-26 LAB — BASIC METABOLIC PANEL
Anion gap: 6 (ref 5–15)
BUN: 24 mg/dL — ABNORMAL HIGH (ref 6–20)
CO2: 24 mmol/L (ref 22–32)
Calcium: 8.9 mg/dL (ref 8.9–10.3)
Chloride: 109 mmol/L (ref 98–111)
Creatinine, Ser: 3.31 mg/dL — ABNORMAL HIGH (ref 0.61–1.24)
GFR calc Af Amer: 24 mL/min — ABNORMAL LOW (ref >60)
GFR calc non Af Amer: 21 mL/min — ABNORMAL LOW (ref >60)
Glucose, Bld: 97 mg/dL (ref 70–99)
Potassium: 4.1 mmol/L (ref 3.5–5.1)
Sodium: 139 mmol/L (ref 135–145)

## 2019-11-26 LAB — CBC
HCT: 36.9 % — ABNORMAL LOW (ref 39.0–52.0)
Hemoglobin: 12.8 g/dL — ABNORMAL LOW (ref 13.0–17.0)
MCH: 32.4 pg (ref 26.0–34.0)
MCHC: 34.7 g/dL (ref 30.0–36.0)
MCV: 93.4 fL (ref 80.0–100.0)
Platelets: 107 10*3/uL — ABNORMAL LOW (ref 150–400)
RBC: 3.95 MIL/uL — ABNORMAL LOW (ref 4.22–5.81)
RDW: 13 % (ref 11.5–15.5)
WBC: 4.6 10*3/uL (ref 4.0–10.5)
nRBC: 0.4 % — ABNORMAL HIGH (ref 0.0–0.2)

## 2019-11-26 MED ORDER — BUTALBITAL-APAP-CAFFEINE 50-325-40 MG PO TABS
1.0000 | ORAL_TABLET | ORAL | Status: DC | PRN
  Administered 2019-11-26: 1 via ORAL
  Filled 2019-11-26: qty 1

## 2019-11-26 NOTE — Discharge Summary (Signed)
Physician Discharge Summary  [name] LFY:[id number] DOB: Jul 21, 1970 DOA: 11/24/2019  PCP: Kerin Perna, NP  Admit date: 11/24/2019 Discharge date: 11/26/2019  Admitted From: home Discharge disposition: home   Recommendations for Outpatient Follow-Up:   1. Titration of BP Medications 2. Close monitoring of his renal function 3. Patient being worked up for OSA   Discharge Diagnosis:   Principal Problem:   Chest pain Active Problems:   Uncontrolled hypertension   Hypertensive urgency   CKD (chronic kidney disease), stage IV (HCC)   Cardiomyopathy (HCC)   S/P aortic dissection repair   History of stroke   COPD (chronic obstructive pulmonary disease) ([place])    Discharge Condition: Improved.  Diet recommendation: Low sodium, heart healthy.  Wound care: None.  Code status: Full.   History of Present Illness:   [name] is a 49 y.o. male with medical history significant for hypertension, chronic kidney disease stage IIIb, cardiomyopathy with EF 45 to 50% 1 year ago, history of CVA, remote alcohol abuse history, and aortic dissection in 8527 complicated by left renal infarction and status post [place] and total arch replacement, now presenting to the emergency department with worsening abdominal and chest pain.  Patient reports intermittent chest pain since 11/20/2019, severe at times, tearing in character, and radiating from his left chest into his abdomen and back.  Patient is concerned that the symptoms are similar to what he was experiencing when his aorta dissected.  Symptoms seem to be worse with activity and better with rest.  He denies any fevers, cough, or leg swelling.   Hospital Course by Problem:   Chest pain; CAD -Presents with 5 days of chest pain, had no acute findings on dissection study and mildly elevated troponin -echo done -nuclear medicine study per cards: Nuclear stress test was read by Radiology and read out as inferior infarct  and no ischemia with moderate LV dysfunction.  I have personally reviewed the images and there is a fixed defect in the inferolateral and inferior walls that is fixed.  The gated images show normal LVF.  An inferior infarct was called but in setting of normal LVF both on nuc and echo, this defect is consistent with diaphragmatic attenuation artifact.  No ischemia.  -cards consult appreciated   Hypertensive urgency --resume home meds with outpatient tiration -needs OSA work up  Aortic dissection s/p repair -No acute findings on dissection study  CKD IIIb-IV -SCr is 3.09 on admission, down from 3.27 three weeks ago -close outpatient follow up with Dr. Joelyn Oms  History of CVA -Continue ASA and statin  COPD -No cough or wheezing on admission -Continue inhalers     Medical Consultants:    cardiology  Discharge Exam:   Vitals:   11/25/19 2332 11/26/19 0544  BP: (!) 154/90 (!) 160/91  Pulse: 72 83  Resp: 15 20  Temp: 98.1 F (36.7 C) 98.9 F (37.2 C)  SpO2: 99% 97%   Vitals:   11/25/19 1818 11/25/19 2100 11/25/19 2332 11/26/19 0544  BP: (!) 155/89  (!) 154/90 (!) 160/91  Pulse: 73  72 83  Resp: '16  15 20  ' Temp: 98.2 F (36.8 C)  98.1 F (36.7 C) 98.9 F (37.2 C)  TempSrc: Oral  Oral Oral  SpO2: 99%  99% 97%  Weight:  74.2 kg      General exam: Appears calm and comfortable.    The results of significant diagnostics from this hospitalization (including imaging, microbiology, ancillary and  laboratory) are listed below for reference.     Procedures and Diagnostic Studies:   DG Chest 2 View  Result Date: 11/24/2019 CLINICAL DATA:  Chest pain, shortness of breath. EXAM: CHEST - 2 VIEW COMPARISON:  09/24/2018 FINDINGS: Similar appearance of the chest with mildly enlarged cardiac silhouette, median sternotomy, and surgical aortic annulus and multi component stent endograft repair of the aorta. Similar mediastinal contour. Similar chronic  blunting of the left costophrenic sulcus, likely related to scar. No pneumothorax. IMPRESSION: 1. No evidence of acute cardiopulmonary disease. 2. Similar cardiomegaly with extensive postoperative changes related to aortic annulus and multi component stent endograft repair of the aorta. Electronically Signed   By: Margaretha Sheffield MD   On: 11/24/2019 13:31   NM Myocar Multi W/Spect Tamela Oddi Motion / EF  Result Date: 11/25/2019 CLINICAL DATA:  Chest pain.  Cardiomyopathy. EXAM: MYOCARDIAL IMAGING WITH SPECT (REST AND PHARMACOLOGIC-STRESS) GATED LEFT VENTRICULAR WALL MOTION STUDY LEFT VENTRICULAR EJECTION FRACTION TECHNIQUE: Standard myocardial SPECT imaging was performed after resting intravenous injection of 10 mCi Tc-67mtetrofosmin. Subsequently, intravenous infusion of Lexiscan was performed under the supervision of the Cardiology staff. At peak effect of the drug, 30 mCi Tc-970metrofosmin was injected intravenously and standard myocardial SPECT imaging was performed. Quantitative gated imaging was also performed to evaluate left ventricular wall motion, and estimate left ventricular ejection fraction. COMPARISON:  Two-view chest x-ray scratched at CT a of the chest 11/24/2019 FINDINGS: Perfusion: Matched defect is present at the inferior wall consistent with remote infarct. No reversible ischemia is evident. Wall Motion: Marked hypokinesis is present at the septum and base. Left Ventricular Ejection Fraction: 35 % End diastolic volume 15709l End systolic volume 10628l IMPRESSION: 1. Remote infarct involving the inferior wall. Left ventricle is dilated. 2. Marked hypokinesis involving the inferior wall and septum. 3. Left ventricular ejection fraction 35% 4. Non invasive risk stratification*: High *2012 Appropriate Use Criteria for Coronary Revascularization Focused Update: J Am Coll Cardiol. 203662;94(7):[email or web address]://[email or web address] Electronically Signed   By:  ChSan Morelle.D.   On: 11/25/2019 15:01   ECHOCARDIOGRAM COMPLETE  Result Date: 11/25/2019    ECHOCARDIOGRAM REPORT   Patient Name:   Andre JARCHOWate of Exam: 11/25/2019 Medical Rec #:  [id number]   Height:       71.0 in Accession #:    [id number]  Weight:       153.0 lb Date of Birth:  [birth year]    BSA:          1.882 m Patient Age:    [age]      BP:           169/98 mmHg Patient Gender: M             HR:           96 bpm. Exam Location:  Inpatient Procedure: 2D Echo, 3D Echo and Strain Analysis Indications:    Chest Pain 786.50 / R07.9  History:        Patient has prior history of Echocardiogram examinations, most                 recent 09/25/2018. CHF CAD Aortic Valve Disease and Mitral                 Valve Disease Risk Factors: Current Smoker, Hypertension and                 Dyslipidemia. Hx of aortic dissection  and arch replacement.                 Alcohol abuse. CKD.                 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 Procedure Date: 10/03/2014.  Sonographer:    Darlina Sicilian RDCS Referring Phys: [id number] [place]  1. Left ventricular ejection fraction, by estimation, is 60 to 65%. The left ventricle has normal function. The left ventricle has no regional wall motion abnormalities. There is severe concentric left ventricular hypertrophy. Left ventricular diastolic  parameters are consistent with Grade I diastolic dysfunction (impaired relaxation). Elevated left ventricular end-diastolic pressure.  2. Right ventricular systolic function is normal. The right ventricular size is normal. Tricuspid regurgitation signal is inadequate for assessing PA pressure.  3. Left atrial size was mildly dilated.  4. The mitral valve is normal in structure. No evidence of mitral valve regurgitation. No evidence of mitral stenosis.  5. The aortic valve  is normal in structure. Aortic valve regurgitation is not visualized. No aortic stenosis is present. 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 Procedure Date: 10/03/2014. Echo findings are consistent with normal structure and function of the aortic valve prosthesis. Aortic valve area, by VTI measures 2.01 cm. Aortic valve mean gradient measures 18.0 mmHg. Aortic valve Vmax measures 2.82 m/s.  6. Aortic dilatation noted. There is mild dilatation of the ascending aorta, measuring 40 mm.  7. The inferior vena cava is normal in size with greater than 50% respiratory variability, suggesting right atrial pressure of 3 mmHg. FINDINGS  Left Ventricle: Left ventricular ejection fraction, by estimation, is 60 to 65%. The left ventricle has normal function. The left ventricle has no regional wall motion abnormalities. Global longitudinal strain performed but not reported based on interpreter judgement due to suboptimal tracking. The left ventricular internal cavity size was normal in size. There is severe concentric left ventricular hypertrophy. Left ventricular diastolic parameters are consistent with Grade I diastolic dysfunction (impaired relaxation). Elevated left ventricular end-diastolic pressure. Right Ventricle: The right ventricular size is normal. No increase in right ventricular wall thickness. Right ventricular systolic function is normal. Tricuspid regurgitation signal is inadequate for assessing PA pressure. Left Atrium: Left atrial size was mildly dilated. Right Atrium: Right atrial size was normal in size. Pericardium: There is no evidence of pericardial effusion. Mitral Valve: The mitral valve is normal in structure. No evidence of mitral valve regurgitation. No evidence of mitral valve stenosis. Tricuspid Valve: The tricuspid valve is normal in structure. Tricuspid valve regurgitation is trivial. No evidence of  tricuspid stenosis. Aortic Valve: The aortic valve is normal in structure. Aortic valve regurgitation is not visualized. No aortic stenosis is present. Aortic valve mean gradient measures 18.0 mmHg. Aortic valve peak gradient measures 31.8 mmHg. Aortic valve area, by VTI measures 2.01 cm. 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 Procedure Date: 10/03/2014. Echo findings are consistent with normal structure and function of the  aortic valve prosthesis. Pulmonic Valve: The pulmonic valve was normal in structure.  Pulmonic valve regurgitation is not visualized. No evidence of pulmonic stenosis. Aorta: Aortic dilatation noted. There is mild dilatation of the ascending aorta, measuring 40 mm. Venous: The inferior vena cava is normal in size with greater than 50% respiratory variability, suggesting right atrial pressure of 3 mmHg. IAS/Shunts: No atrial level shunt detected by color flow Doppler.  LEFT VENTRICLE PLAX 2D LVIDd:         4.00 cm  Diastology LVIDs:         2.50 cm  LV e' medial:    3.57 cm/s LV PW:         1.70 cm  LV E/e' medial:  16.0 LV IVS:        1.90 cm  LV e' lateral:   3.37 cm/s LVOT diam:     2.40 cm  LV E/e' lateral: 16.9 LV SV:         93 LV SV Index:   49 LVOT Area:     4.52 cm  RIGHT VENTRICLE RV S prime:     10.60 cm/s TAPSE (M-mode): 2.1 cm LEFT ATRIUM             Index       RIGHT ATRIUM           Index LA diam:        5.40 cm 2.87 cm/m  RA Area:     15.30 cm LA Vol (A2C):   69.9 ml 37.15 ml/m RA Volume:   31.80 ml  16.90 ml/m LA Vol (A4C):   76.3 ml 40.55 ml/m LA Biplane Vol: 75.3 ml 40.02 ml/m  AORTIC VALVE AV Area (Vmax):    2.25 cm AV Area (Vmean):   1.94 cm AV Area (VTI):     2.01 cm AV Vmax:           282.00 cm/s AV Vmean:          198.500 cm/s AV VTI:            0.460 m AV Peak Grad:      31.8 mmHg AV Mean Grad:      18.0 mmHg LVOT Vmax:         140.00 cm/s LVOT Vmean:        85.300  cm/s LVOT VTI:          0.205 m LVOT/AV VTI ratio: 0.45  AORTA Ao Root diam: 3.40 cm Ao Asc diam:  4.00 cm MITRAL VALVE MV Area (PHT): 2.97 cm    SHUNTS MV Decel Time: 255 msec    Systemic VTI:  0.20 m MV E velocity: 57.00 cm/s  Systemic Diam: 2.40 cm MV A velocity: 95.10 cm/s MV E/A ratio:  0.60 Fransico Him MD Electronically signed by Fransico Him MD Signature Date/Time: 11/25/2019/9:25:38 AM    Final    CT Angio Chest/Abd/Pel for Dissection W and/or Wo Contrast  Result Date: 11/24/2019 CLINICAL DATA:  49 year old male with a history of thoracic aortic dissection status post repair with left upper chest pain. EXAM: CT ANGIOGRAPHY CHEST, ABDOMEN AND PELVIS TECHNIQUE: Non-contrast CT of the chest was initially obtained. Multidetector CT imaging through the chest, abdomen and pelvis was performed using the standard protocol during bolus administration of intravenous contrast. Multiplanar reconstructed images and MIPs were obtained and reviewed to evaluate the vascular anatomy. CONTRAST:  162m OMNIPAQUE IOHEXOL 350 MG/ML SOLN COMPARISON:  Prior CT arteriogram 08/25/2018 FINDINGS: CTA CHEST FINDINGS Cardiovascular: Surgical changes of complex prior thoracic aortic repair with replaced aortic valve, deep  branching and reimplantation of the branch arteries, [place] trunk graft repair of the ascending thoracic aorta, and stent graft repair of the transverse and descending thoracic aorta. No evidence of new dissection flap. The reimplanted brachiocephalic, left common carotid and left subclavian arteries are all widely patent. No evidence of dissection. The main pulmonary artery is normal in size. Stable cardiomegaly with marked concentric hypertrophy of the left ventricular myocardium. Mediastinum/Nodes: No enlarged mediastinal, hilar, or axillary lymph nodes. Thyroid gland, trachea, and esophagus demonstrate no significant findings. Lungs/Pleura: Lungs are clear. No pleural effusion or pneumothorax. Minimal chronic  atelectasis versus scarring in the left lower lobe and periphery of the lingula. Musculoskeletal: Healed median sternotomy. No acute osseous abnormality. Review of the MIP images confirms the above findings. CTA ABDOMEN AND PELVIS FINDINGS VASCULAR Aorta: Stable appearance of complex postsurgical changes including tube graft repair of the visceral and juxtarenal abdominal aorta with probable reimplantation of the celiac axis and SMA. Additionally, there appears to be an aortic to distal renal bypass graft on the left. No evidence of complication or interval change. A circumscribed fluid collection adjacent to the repaired aortic segment remains stable at 3.6 x 3.0 cm. 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 Celiac: Reimplanted/grafted celiac artery remains widely patent. SMA: Reimplanted/grafted SMA remains widely patent. Renals: Reimplanted/graft to bilateral renal arteries remain patent. IMA: Patent without evidence of aneurysm, dissection, vasculitis or significant stenosis. 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 Veins: No focal venous abnormality. Review of the MIP images confirms the above findings. NON-VASCULAR Hepatobiliary: No focal liver abnormality is seen. No gallstones, gallbladder wall thickening, or biliary dilatation. Pancreas: Unremarkable. No pancreatic ductal dilatation or surrounding inflammatory changes. Spleen: Normal in size without focal abnormality. Adrenals/Urinary Tract: Adrenal glands are unremarkable. Kidneys are normal, without renal calculi, focal lesion, or hydronephrosis. Bladder is unremarkable. Stomach/Bowel: Stomach is within normal limits. Appendix appears normal. No evidence of bowel wall thickening, distention, or inflammatory changes. Lymphatic: No suspicious  lymphadenopathy. Reproductive: Prostate is unremarkable. Other: No abdominal wall hernia or abnormality. No abdominopelvic ascites. Musculoskeletal: No acute or significant osseous findings. Review of the MIP images confirms the above findings. IMPRESSION: 1. Stable appearance of complex prior combined open and endovascular repair of prior ascending thoracic and abdominal aortic dissection/aneurysms. Full details as above. No evidence of new dissection or acute complication. 2. Slight interval enlargement of the ectatic native distal infrarenal aorta to a maximum of 2.9 cm today compared to 2.7 cm previously. 3. Cardiomegaly with marked left ventricular hypertrophy. 4. No acute abnormality within the abdomen or pelvis. Electronically Signed   By: Jacqulynn Cadet M.D.   On: 11/24/2019 15:22     Labs:   Basic Metabolic Panel: Recent Labs  Lab 11/24/19 1300 11/24/19 1300 11/25/19 0317 11/26/19 0532  NA 141  --  140 139  K 3.9   < > 3.7 4.1  CL 108  --  110 109  CO2 22  --  20* 24  GLUCOSE 96  --  80 97  BUN 26*  --  25* 24*  CREATININE 3.09*  --  2.93* 3.31*  CALCIUM 9.3  --  8.9 8.9   < > = values in this interval not displayed.   GFR Estimated Creatinine Clearance: 29 mL/min (A) (by C-G formula based on  SCr of 3.31 mg/dL (H)). Liver Function Tests: No results for input(s): AST, ALT, ALKPHOS, BILITOT, PROT, ALBUMIN in the last 168 hours. No results for input(s): LIPASE, AMYLASE in the last 168 hours. No results for input(s): AMMONIA in the last 168 hours. Coagulation profile No results for input(s): INR, PROTIME in the last 168 hours.  CBC: Recent Labs  Lab 11/24/19 1300 11/25/19 0317 11/26/19 0532  WBC 5.4 4.3 4.6  HGB 13.5 11.9* 12.8*  HCT 42.6 36.4* 36.9*  MCV 98.4 94.5 93.4  PLT 115* 107* 107*   Cardiac Enzymes: No results for input(s): CKTOTAL, CKMB, CKMBINDEX, TROPONINI in the last 168 hours. BNP: Invalid input(s): POCBNP CBG: No results for input(s): GLUCAP in  the last 168 hours. D-Dimer No results for input(s): DDIMER in the last 72 hours. Hgb A1c No results for input(s): HGBA1C in the last 72 hours. Lipid Profile No results for input(s): CHOL, HDL, LDLCALC, TRIG, CHOLHDL, LDLDIRECT in the last 72 hours. Thyroid function studies No results for input(s): TSH, T4TOTAL, T3FREE, THYROIDAB in the last 72 hours.  Invalid input(s): FREET3 Anemia work up No results for input(s): VITAMINB12, FOLATE, FERRITIN, TIBC, IRON, RETICCTPCT in the last 72 hours. Microbiology Recent Results (from the past 240 hour(s))  SARS Coronavirus 2 by RT PCR (hospital order, performed in [facility] hospital lab) Nasopharyngeal Nasopharyngeal Swab     Status: None   Collection Time: 11/24/19  9:40 PM   Specimen: Nasopharyngeal Swab  Result Value Ref Range Status   SARS Coronavirus 2 NEGATIVE NEGATIVE Final    Comment: (NOTE) SARS-CoV-2 target nucleic acids are NOT DETECTED.  The SARS-CoV-2 RNA is generally detectable in upper and lower respiratory specimens during the acute phase of infection. The lowest concentration of SARS-CoV-2 viral copies this assay can detect is 250 copies / mL. A negative result does not preclude SARS-CoV-2 infection and should not be used as the sole basis for treatment or other patient management decisions.  A negative result may occur with improper specimen collection / handling, submission of specimen other than nasopharyngeal swab, presence of viral mutation(s) within the areas targeted by this assay, and inadequate number of viral copies (<250 copies / mL). A negative result must be combined with clinical observations, patient history, and epidemiological information.  Fact Sheet for Patients:   StrictlyIdeas.no  Fact Sheet for Healthcare Providers: BankingDealers.co.za  This test is not yet approved or  cleared by the [place] FDA and has been authorized for detection and/or  diagnosis of SARS-CoV-2 by FDA under an Emergency Use Authorization (EUA).  This EUA will remain in effect (meaning this test can be used) for the duration of the COVID-19 declaration under Section 564(b)(1) of the Act, 21 U.S.C. section 360bbb-3(b)(1), unless the authorization is terminated or revoked sooner.  Performed at [facility], [place] [address] [place], [place] [address]      Discharge Instructions:   Discharge Instructions    Diet - low sodium heart healthy   Complete by: As directed    Discharge instructions   Complete by: As directed    Follow up with Dr. Joelyn Oms Keep appointment for your sleep apnea study   Increase activity slowly   Complete by: As directed      Allergies as of 11/26/2019      Reactions   Imdur [isosorbide Nitrate] Other (See Comments)   Headaches   Tramadol Other (See Comments)   Headaches      Medication List    STOP taking  these medications   Repatha SureClick 350 MG/ML [place] Generic drug: Evolocumab     TAKE these medications   acetaminophen 325 MG tablet Commonly known as: TYLENOL Take 650 mg by mouth every 6 (six) hours as needed (for pain or headaches).   albuterol 108 (90 Base) MCG/ACT inhaler Commonly known as: VENTOLIN HFA INHALE 2 PUFFS INTO THE LUNGS EVERY 6 HOURS AS NEEDED FOR WHEEZING OR SHORTNESS OF BREATH What changed: See the new instructions.   amitriptyline 50 MG tablet Commonly known as: ELAVIL TAKE 1 TABLET(50 MG) BY MOUTH AT BEDTIME What changed: See the new instructions.   amLODipine 10 MG tablet Commonly known as: NORVASC Take 1 tablet (10 mg total) by mouth daily.   aspirin 81 MG EC tablet Take 1 tablet (81 mg total) by mouth daily.   Blood Pressure Monitor Kit 1 Device by Does not apply route 2 (two) times daily.   carvedilol 25 MG tablet Commonly known as: COREG Take 1 tablet (25 mg total) by mouth 2 (two) times daily with a meal.   cloNIDine 0.3 mg/24hr patch Commonly known as:  CATAPRES - Dosed in mg/24 hr Place 1 patch (0.3 mg total) onto the skin once a week.   doxazosin 8 MG tablet Commonly known as: Cardura Take 1 tablet (8 mg total) by mouth at bedtime.   ezetimibe 10 MG tablet Commonly known as: ZETIA Take 1 tablet (10 mg total) by mouth daily.   minoxidil 10 MG tablet Commonly known as: LONITEN Take 10 mg by mouth 2 (two) times daily.   pantoprazole 40 MG tablet Commonly known as: PROTONIX TAKE 1 TABLET(40 MG) BY MOUTH DAILY What changed: See the new instructions.   rosuvastatin 40 MG tablet Commonly known as: CRESTOR Take 1 tablet (40 mg total) by mouth daily.   Stiolto Respimat 2.5-2.5 MCG/ACT Aers Generic drug: Tiotropium Bromide-Olodaterol INHALE 2 PUFFS INTO THE LUNGS DAILY       Follow-up Information    Kerin Perna, NP Follow up in 1 week(s).   Specialty: Internal Medicine Contact information: [place] [address] [phone]        Larey Dresser, MD. Go on 11/28/2019.   Specialty: Cardiology Why: '@2' :40 for hospital follow up BMP Contact information: [address] [place] [address] [phone]                Time coordinating discharge: 35 min  Signed:  Geradine Girt DO  [facility] 11/26/2019, 8:57 AM

## 2019-11-26 NOTE — Progress Notes (Signed)
Subjective:  Feels well no complaints   Objective:  Vitals:   11/25/19 1818 11/25/19 2100 11/25/19 2332 11/26/19 0544  BP: (!) 155/89  (!) 154/90 (!) 160/91  Pulse: 73  72 83  Resp: 16  15 20   Temp: 98.2 F (36.8 C)  98.1 F (36.7 C) 98.9 F (37.2 C)  TempSrc: Oral  Oral Oral  SpO2: 99%  99% 97%  Weight:  74.2 kg      Intake/Output from previous day:  Intake/Output Summary (Last 24 hours) at 11/26/2019 0919 Last data filed at 11/26/2019 0500 Gross per 24 hour  Intake 0 ml  Output 550 ml  Net -550 ml    Physical Exam: Affect appropriate Chronically ill black male  HEENT: normal Neck supple with no adenopathy JVP normal no bruits no thyromegaly Lungs clear with no wheezing and good diaphragmatic motion Heart:  S1/S2 loud SEM  murmur, no rub, gallop or click PMI normal post sternotomy  Abdomen: benighn, BS positve, no tenderness, no AAA no bruit.  No HSM or HJR Distal pulses intact with no bruits No edema Neuro non-focal Skin warm and dry No muscular weakness   Lab Results: Basic Metabolic Panel: Recent Labs    11/25/19 0317 11/26/19 0532  NA 140 139  K 3.7 4.1  CL 110 109  CO2 20* 24  GLUCOSE 80 97  BUN 25* 24*  CREATININE 2.93* 3.31*  CALCIUM 8.9 8.9   Liver Function Tests: No results for input(s): AST, ALT, ALKPHOS, BILITOT, PROT, ALBUMIN in the last 72 hours. No results for input(s): LIPASE, AMYLASE in the last 72 hours. CBC: Recent Labs    11/25/19 0317 11/26/19 0532  WBC 4.3 4.6  HGB 11.9* 12.8*  HCT 36.4* 36.9*  MCV 94.5 93.4  PLT 107* 107*   Anemia Panel: No results for input(s): VITAMINB12, FOLATE, FERRITIN, TIBC, IRON, RETICCTPCT in the last 72 hours.  Imaging: DG Chest 2 View  Result Date: 11/24/2019 CLINICAL DATA:  Chest pain, shortness of breath. EXAM: CHEST - 2 VIEW COMPARISON:  09/24/2018 FINDINGS: Similar appearance of the chest with mildly enlarged cardiac silhouette, median sternotomy, and surgical aortic annulus and  multi component stent endograft repair of the aorta. Similar mediastinal contour. Similar chronic blunting of the left costophrenic sulcus, likely related to scar. No pneumothorax. IMPRESSION: 1. No evidence of acute cardiopulmonary disease. 2. Similar cardiomegaly with extensive postoperative changes related to aortic annulus and multi component stent endograft repair of the aorta. Electronically Signed   By: Margaretha Sheffield MD   On: 11/24/2019 13:31   NM Myocar Multi W/Spect Tamela Oddi Motion / EF  Result Date: 11/25/2019 CLINICAL DATA:  Chest pain.  Cardiomyopathy. EXAM: MYOCARDIAL IMAGING WITH SPECT (REST AND PHARMACOLOGIC-STRESS) GATED LEFT VENTRICULAR WALL MOTION STUDY LEFT VENTRICULAR EJECTION FRACTION TECHNIQUE: Standard myocardial SPECT imaging was performed after resting intravenous injection of 10 mCi Tc-28m tetrofosmin. Subsequently, intravenous infusion of Lexiscan was performed under the supervision of the Cardiology staff. At peak effect of the drug, 30 mCi Tc-18m tetrofosmin was injected intravenously and standard myocardial SPECT imaging was performed. Quantitative gated imaging was also performed to evaluate left ventricular wall motion, and estimate left ventricular ejection fraction. COMPARISON:  Two-view chest x-ray scratched at CT a of the chest 11/24/2019 FINDINGS: Perfusion: Matched defect is present at the inferior wall consistent with remote infarct. No reversible ischemia is evident. Wall Motion: Marked hypokinesis is present at the septum and base. Left Ventricular Ejection Fraction: 35 % End diastolic volume 269 ml  End systolic volume 295 ml IMPRESSION: 1. Remote infarct involving the inferior wall. Left ventricle is dilated. 2. Marked hypokinesis involving the inferior wall and septum. 3. Left ventricular ejection fraction 35% 4. Non invasive risk stratification*: High *2012 Appropriate Use Criteria for Coronary Revascularization Focused Update: J Am Coll Cardiol. 1884;16(6):063-016.  http://content.airportbarriers.com.aspx?[email or web address] Electronically Signed   By: San Morelle M.D.   On: 11/25/2019 15:01   ECHOCARDIOGRAM COMPLETE  Result Date: 11/25/2019    ECHOCARDIOGRAM REPORT   Patient Name:   [name] Date of Exam: 11/25/2019 Medical Rec #:  [id number]     Height:       71.0 in Accession #:    [id number]    Weight:       153.0 lb Date of Birth:  [birth year]      BSA:          1.882 m Patient Age:    49 years      BP:           169/98 mmHg Patient Gender: M             HR:           96 bpm. Exam Location:  Inpatient Procedure: 2D Echo, 3D Echo and Strain Analysis Indications:    Chest Pain 786.50 / R07.9  History:        Patient has prior history of Echocardiogram examinations, most                 recent 09/25/2018. CHF CAD Aortic Valve Disease and Mitral                 Valve Disease Risk Factors: Current Smoker, Hypertension and                 Dyslipidemia. Hx of aortic dissection and arch replacement.                 Alcohol abuse. CKD.                 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 Procedure Date: 10/03/2014.  Sonographer:    Darlina Sicilian RDCS Referring Phys: [id number] [place]  1. Left ventricular ejection fraction, by estimation, is 60 to 65%. The left ventricle has normal function. The left ventricle has no regional wall motion abnormalities. There is severe concentric left ventricular hypertrophy. Left ventricular diastolic  parameters are consistent with Grade I diastolic dysfunction (impaired relaxation). Elevated left ventricular end-diastolic pressure.  2. Right ventricular systolic function is normal. The right ventricular size is normal. Tricuspid regurgitation signal is inadequate for assessing PA pressure.  3. Left atrial size was mildly dilated.  4. The mitral valve is normal in structure. No  evidence of mitral valve regurgitation. No evidence of mitral stenosis.  5. The aortic valve is normal in structure. Aortic valve regurgitation is not visualized. No aortic stenosis is present. 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 Procedure Date: 10/03/2014. Echo findings are consistent with normal structure and function of the aortic valve prosthesis. Aortic valve area, by VTI measures 2.01 cm. Aortic valve mean gradient measures  18.0 mmHg. Aortic valve Vmax measures 2.82 m/s.  6. Aortic dilatation noted. There is mild dilatation of the ascending aorta, measuring 40 mm.  7. The inferior vena cava is normal in size with greater than 50% respiratory variability, suggesting right atrial pressure of 3 mmHg. FINDINGS  Left Ventricle: Left ventricular ejection fraction, by estimation, is 60 to 65%. The left ventricle has normal function. The left ventricle has no regional wall motion abnormalities. Global longitudinal strain performed but not reported based on interpreter judgement due to suboptimal tracking. The left ventricular internal cavity size was normal in size. There is severe concentric left ventricular hypertrophy. Left ventricular diastolic parameters are consistent with Grade I diastolic dysfunction (impaired relaxation). Elevated left ventricular end-diastolic pressure. Right Ventricle: The right ventricular size is normal. No increase in right ventricular wall thickness. Right ventricular systolic function is normal. Tricuspid regurgitation signal is inadequate for assessing PA pressure. Left Atrium: Left atrial size was mildly dilated. Right Atrium: Right atrial size was normal in size. Pericardium: There is no evidence of pericardial effusion. Mitral Valve: The mitral valve is normal in structure. No evidence of mitral valve regurgitation. No evidence of mitral valve stenosis. Tricuspid Valve: The  tricuspid valve is normal in structure. Tricuspid valve regurgitation is trivial. No evidence of tricuspid stenosis. Aortic Valve: The aortic valve is normal in structure. Aortic valve regurgitation is not visualized. No aortic stenosis is present. Aortic valve mean gradient measures 18.0 mmHg. Aortic valve peak gradient measures 31.8 mmHg. Aortic valve area, by VTI measures 2.01 cm. 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 Procedure Date: 10/03/2014. Echo findings are consistent with normal structure and function of the  aortic valve prosthesis. Pulmonic Valve: The pulmonic valve was normal in structure. Pulmonic valve regurgitation is not visualized. No evidence of pulmonic stenosis. Aorta: Aortic dilatation noted. There is mild dilatation of the ascending aorta, measuring 40 mm. Venous: The inferior vena cava is normal in size with greater than 50% respiratory variability, suggesting right atrial pressure of 3 mmHg. IAS/Shunts: No atrial level shunt detected by color flow Doppler.  LEFT VENTRICLE PLAX 2D LVIDd:         4.00 cm  Diastology LVIDs:         2.50 cm  LV e' medial:    3.57 cm/s LV PW:         1.70 cm  LV E/e' medial:  16.0 LV IVS:        1.90 cm  LV e' lateral:   3.37 cm/s LVOT diam:     2.40 cm  LV E/e' lateral: 16.9 LV SV:         93 LV SV Index:   49 LVOT Area:     4.52 cm  RIGHT VENTRICLE RV S prime:     10.60 cm/s TAPSE (M-mode): 2.1 cm LEFT ATRIUM             Index       RIGHT ATRIUM           Index LA diam:        5.40 cm 2.87 cm/m  RA Area:     15.30 cm LA Vol (A2C):   69.9 ml 37.15 ml/m RA Volume:   31.80 ml  16.90 ml/m LA Vol (A4C):   76.3 ml 40.55 ml/m LA Biplane Vol: 75.3 ml 40.02 ml/m  AORTIC VALVE AV Area (Vmax):    2.25  cm AV Area (Vmean):   1.94 cm AV Area (VTI):     2.01 cm AV Vmax:           282.00 cm/s AV Vmean:          198.500 cm/s AV VTI:            0.460 m AV Peak Grad:       31.8 mmHg AV Mean Grad:      18.0 mmHg LVOT Vmax:         140.00 cm/s LVOT Vmean:        85.300 cm/s LVOT VTI:          0.205 m LVOT/AV VTI ratio: 0.45  AORTA Ao Root diam: 3.40 cm Ao Asc diam:  4.00 cm MITRAL VALVE MV Area (PHT): 2.97 cm    SHUNTS MV Decel Time: 255 msec    Systemic VTI:  0.20 m MV E velocity: 57.00 cm/s  Systemic Diam: 2.40 cm MV A velocity: 95.10 cm/s MV E/A ratio:  0.60 Fransico Him MD Electronically signed by Fransico Him MD Signature Date/Time: 11/25/2019/9:25:38 AM    Final    CT Angio Chest/Abd/Pel for Dissection W and/or Wo Contrast  Result Date: 11/24/2019 CLINICAL DATA:  49 year old male with a history of thoracic aortic dissection status post repair with left upper chest pain. EXAM: CT ANGIOGRAPHY CHEST, ABDOMEN AND PELVIS TECHNIQUE: Non-contrast CT of the chest was initially obtained. Multidetector CT imaging through the chest, abdomen and pelvis was performed using the standard protocol during bolus administration of intravenous contrast. Multiplanar reconstructed images and MIPs were obtained and reviewed to evaluate the vascular anatomy. CONTRAST:  139mL OMNIPAQUE IOHEXOL 350 MG/ML SOLN COMPARISON:  Prior CT arteriogram 08/25/2018 FINDINGS: CTA CHEST FINDINGS Cardiovascular: Surgical changes of complex prior thoracic aortic repair with replaced aortic valve, deep branching and reimplantation of the branch arteries, [place] trunk graft repair of the ascending thoracic aorta, and stent graft repair of the transverse and descending thoracic aorta. No evidence of new dissection flap. The reimplanted brachiocephalic, left common carotid and left subclavian arteries are all widely patent. No evidence of dissection. The main pulmonary artery is normal in size. Stable cardiomegaly with marked concentric hypertrophy of the left ventricular myocardium. Mediastinum/Nodes: No enlarged mediastinal, hilar, or axillary lymph nodes. Thyroid gland, trachea, and esophagus demonstrate no  significant findings. Lungs/Pleura: Lungs are clear. No pleural effusion or pneumothorax. Minimal chronic atelectasis versus scarring in the left lower lobe and periphery of the lingula. Musculoskeletal: Healed median sternotomy. No acute osseous abnormality. Review of the MIP images confirms the above findings. CTA ABDOMEN AND PELVIS FINDINGS VASCULAR Aorta: Stable appearance of complex postsurgical changes including tube graft repair of the visceral and juxtarenal abdominal aorta with probable reimplantation of the celiac axis and SMA. Additionally, there appears to be an aortic to distal renal bypass graft on the left. No evidence of complication or interval change. A circumscribed fluid collection adjacent to the repaired aortic segment remains stable at 3.6 x 3.0 cm. 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 Celiac: Reimplanted/grafted celiac artery remains widely patent. SMA: Reimplanted/grafted SMA remains widely patent. Renals: Reimplanted/graft to bilateral renal arteries remain patent. IMA: Patent without evidence of aneurysm, dissection, vasculitis or significant stenosis. 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 Veins: No focal venous  abnormality. Review of the MIP images confirms the above findings. NON-VASCULAR Hepatobiliary: No focal liver abnormality is seen. No gallstones, gallbladder wall thickening, or biliary dilatation. Pancreas: Unremarkable. No pancreatic ductal dilatation or surrounding inflammatory changes. Spleen: Normal in size without focal abnormality. Adrenals/Urinary Tract: Adrenal glands are unremarkable. Kidneys are normal, without renal calculi, focal lesion, or hydronephrosis. Bladder is unremarkable. Stomach/Bowel: Stomach is within normal limits. Appendix appears normal.  No evidence of bowel wall thickening, distention, or inflammatory changes. Lymphatic: No suspicious lymphadenopathy. Reproductive: Prostate is unremarkable. Other: No abdominal wall hernia or abnormality. No abdominopelvic ascites. Musculoskeletal: No acute or significant osseous findings. Review of the MIP images confirms the above findings. IMPRESSION: 1. Stable appearance of complex prior combined open and endovascular repair of prior ascending thoracic and abdominal aortic dissection/aneurysms. Full details as above. No evidence of new dissection or acute complication. 2. Slight interval enlargement of the ectatic native distal infrarenal aorta to a maximum of 2.9 cm today compared to 2.7 cm previously. 3. Cardiomegaly with marked left ventricular hypertrophy. 4. No acute abnormality within the abdomen or pelvis. Electronically Signed   By: Jacqulynn Cadet M.D.   On: 11/24/2019 15:22    Cardiac Studies:  ECG: SR LVH   Telemetry:  NSR   Echo: pending today   Medications:   . amitriptyline  50 mg Oral QHS  . amLODipine  10 mg Oral Daily  . arformoterol  15 mcg Nebulization BID  . aspirin EC  81 mg Oral Daily  . carvedilol  25 mg Oral BID WC  . cloNIDine  0.3 mg Transdermal Weekly  . doxazosin  8 mg Oral QHS  . heparin  5,000 Units Subcutaneous Q8H  .  HYDROmorphone (DILAUDID) injection  0.5 mg Intravenous Once  . minoxidil  10 mg Oral BID  . pantoprazole  40 mg Oral Daily  . rosuvastatin  40 mg Oral Daily  . sodium chloride flush  3 mL Intravenous Q12H  . umeclidinium bromide  1 puff Inhalation Daily     . sodium chloride      Assessment/Plan:   1. Chest Pain.  ECG with LVH strain troponin mildly elevated with no trend in setting of CRF not significant Myovue non ischemic suggested decreased EF but normal by echo No indication for further inpatient evaluation ok to be d/c home from cardiac perspective  2. Bental:  Has loud murmur through AV echo pending Aortic dissection in 2016  with PCI ramus and bioprosthetic AVR No AR murmur on exam Last cath 09/26/18 with patent ramus stent and 60% ostial D1 lesion Suspect he will be d/c in am with medical Rx  3. Renal failure:  Cr up to 3.3 per primary service may suggest inpatient consult nephrology needs good BP control    Jenkins Rouge 11/26/2019, 9:19 AM

## 2019-11-27 ENCOUNTER — Telehealth: Payer: Self-pay

## 2019-11-27 NOTE — Telephone Encounter (Signed)
Transition Care Management Follow-up Telephone Call  Date of discharge and from where: 11/26/2019, [facility]  How have you been since you were released from the hospital? He said he is doing pretty good.   Any questions or concerns?  none at this time  Items Reviewed:  Did the pt receive and understand the discharge instructions provided? yes  Medications obtained and verified?  he said that he has all medications and did not have any questions about the med regime  Any new allergies since your discharge?  none reported   Do you have support at home?  yes, he has help at home.  No home health or DME ordered.   Functional Questionnaire: (I = Independent and D = Dependent) ADLs: independent  Follow up appointments reviewed:   PCP Hospital f/u appt confirmed?Juluis Mire, NP 12/06/2019  Specialist Hospital f/u appt confirmed? Cardiology - 11/28/2019; sleep study 12/18/2019  Are transportation arrangements needed?  no  If their condition worsens, is the pt aware to call PCP or go to the Emergency Dept.? yes  Was the patient provided with contact information for the PCP's office or ED?  he has the phone number for the clinic  Was to pt encouraged to call back with questions or concerns?  yes

## 2019-11-28 ENCOUNTER — Other Ambulatory Visit: Payer: Self-pay

## 2019-11-28 ENCOUNTER — Ambulatory Visit (HOSPITAL_COMMUNITY)
Admission: RE | Admit: 2019-11-28 | Discharge: 2019-11-28 | Disposition: A | Discharge status: Home / Self Care | Payer: Medicare HMO | Source: Ambulatory Visit | Attending: Cardiology | Admitting: Cardiology

## 2019-11-28 VITALS — BP 145/90 | HR 99 | Wt 160.6 lb

## 2019-11-28 DIAGNOSIS — I25119 Atherosclerotic heart disease of native coronary artery with unspecified angina pectoris: Secondary | ICD-10-CM | POA: Diagnosis not present

## 2019-11-28 DIAGNOSIS — I10 Essential (primary) hypertension: Secondary | ICD-10-CM

## 2019-11-28 DIAGNOSIS — Z888 Allergy status to other drugs, medicaments and biological substances status: Secondary | ICD-10-CM | POA: Insufficient documentation

## 2019-11-28 DIAGNOSIS — Z952 Presence of prosthetic heart valve: Secondary | ICD-10-CM | POA: Insufficient documentation

## 2019-11-28 DIAGNOSIS — I429 Cardiomyopathy, unspecified: Secondary | ICD-10-CM | POA: Insufficient documentation

## 2019-11-28 DIAGNOSIS — I130 Hypertensive heart and chronic kidney disease with heart failure and stage 1 through stage 4 chronic kidney disease, or unspecified chronic kidney disease: Secondary | ICD-10-CM | POA: Insufficient documentation

## 2019-11-28 DIAGNOSIS — Z79899 Other long term (current) drug therapy: Secondary | ICD-10-CM | POA: Insufficient documentation

## 2019-11-28 DIAGNOSIS — Z7982 Long term (current) use of aspirin: Secondary | ICD-10-CM | POA: Insufficient documentation

## 2019-11-28 DIAGNOSIS — I5042 Chronic combined systolic (congestive) and diastolic (congestive) heart failure: Secondary | ICD-10-CM | POA: Diagnosis present

## 2019-11-28 DIAGNOSIS — K219 Gastro-esophageal reflux disease without esophagitis: Secondary | ICD-10-CM | POA: Insufficient documentation

## 2019-11-28 DIAGNOSIS — Z8249 Family history of ischemic heart disease and other diseases of the circulatory system: Secondary | ICD-10-CM | POA: Insufficient documentation

## 2019-11-28 DIAGNOSIS — Z885 Allergy status to narcotic agent status: Secondary | ICD-10-CM | POA: Insufficient documentation

## 2019-11-28 DIAGNOSIS — I2510 Atherosclerotic heart disease of native coronary artery without angina pectoris: Secondary | ICD-10-CM | POA: Insufficient documentation

## 2019-11-28 DIAGNOSIS — I252 Old myocardial infarction: Secondary | ICD-10-CM | POA: Diagnosis not present

## 2019-11-28 DIAGNOSIS — Z8673 Personal history of transient ischemic attack (TIA), and cerebral infarction without residual deficits: Secondary | ICD-10-CM | POA: Diagnosis not present

## 2019-11-28 DIAGNOSIS — N184 Chronic kidney disease, stage 4 (severe): Secondary | ICD-10-CM | POA: Insufficient documentation

## 2019-11-28 DIAGNOSIS — F17210 Nicotine dependence, cigarettes, uncomplicated: Secondary | ICD-10-CM | POA: Diagnosis not present

## 2019-11-28 DIAGNOSIS — R079 Chest pain, unspecified: Secondary | ICD-10-CM | POA: Diagnosis not present

## 2019-11-28 DIAGNOSIS — E785 Hyperlipidemia, unspecified: Secondary | ICD-10-CM | POA: Insufficient documentation

## 2019-11-28 MED ORDER — HYDRALAZINE HCL 25 MG PO TABS: 25.0000 mg | ORAL_TABLET | Freq: Three times a day (TID) | ORAL | 3 refills | Status: DC

## 2019-11-28 MED ORDER — PANTOPRAZOLE SODIUM 40 MG PO TBEC
40.0000 mg | DELAYED_RELEASE_TABLET | Freq: Every day | ORAL | 6 refills | Status: DC
Start: 2019-11-28 — End: 2021-02-28

## 2019-11-28 NOTE — Patient Instructions (Signed)
Start Hydralazine 25 mg Three times a day  [facility] will contact you about restarting Repatha  Please follow up with our heart failure pharmacist in 3 weeks  Your physician recommends that you schedule a follow-up appointment in: 3 months  If you have any questions or concerns before your next appointment please send [name] a message through [place] or call our office at [phone].    TO LEAVE A MESSAGE FOR THE NURSE SELECT OPTION 2, PLEASE LEAVE A MESSAGE INCLUDING: . YOUR NAME . DATE OF BIRTH . CALL BACK NUMBER . REASON FOR CALL**this is important as we prioritize the call backs  [place] AS LONG AS YOU CALL BEFORE 4:00 PM  At the [facility], you and your health needs are our priority. As part of our continuing mission to provide you with exceptional heart care, we have created designated Provider Care Teams. These Care Teams include your primary Cardiologist (physician) and Advanced Practice Providers (APPs- Physician Assistants and Nurse Practitioners) who all work together to provide you with the care you need, when you need it.   You may see any of the following providers on your designated Care Team at your next follow up: [name] Dr Glori Bickers . Dr [name] . Darrick Grinder, NP . Lyda Jester, PA . Audry Riles, PharmD   Please be sure to bring in all your medications bottles to every appointment.

## 2019-11-29 MED ORDER — REPATHA SURECLICK 140 MG/ML ~~LOC~~ SOAJ: 1.0000 | SUBCUTANEOUS | 11 refills | Status: DC

## 2019-11-29 NOTE — Progress Notes (Signed)
Date:  11/29/2019   ID:  [name], DOB [birth year], MRN [id number]   Provider location: [place] [facility] Type of Visit: Established patient   PCP:  Kerin Perna, NP  Cardiologist:  Dr. Aundra Dubin   History of Present Illness: [name] is a 49 y.o. male who has a history of CAD, NSTEMI 11/2017,  smoking, CKD Stage III, HTN, AAA repair 2016 at [facility] , open repair of TAAA secondary to chronic dissection with multibranch dacron graft 12/2016, CVA 2017, loop recorder, bioprothetic AVR 2016, HLD.   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 The descending thoracic aorta has dilated to 5.1 cm. Cardiac catheterization in April 2016 demonstrated 60% first diagonal lesion. He underwent aortic valve replacement with bovine pericardial tissue valve and reconstruction along with aortic root replacement and endovascular repair of the descending thoracic aortic aneurysm at [facility]. He has had intermittent chest discomfort since then.  Admitted in 12/02/2017 chest pain. Had NSTEMI. He had LHC with stent placed proximal ramus.  Repeat cath in 7/20 with nonobstructive disease.   He was admitted in 9/21 with atypical chest pain.  Echo showed EF 60-65% with severe LVH, normal RV, bioprosthetic aortic valve with mean gradient 18 mmHg.  Cardiolite showed no ischemia.   He presents for followup of CAD, HTN.  He still smokes but down to about 2 cigarettes/day.  Main complaint remains chest pain.  This has been present now for years.  It usually presents at rest, often when he is lying in bed. Tums sometimes helps, sometimes related to meals but not always.   He also sometimes gets chest pain with exertion/arm movement. He fatigues easily with heavier activity like push-mowing his grass. No significant exertional dyspnea. BP remains high.    ECG (personally reviewed): sinus tachy 103, LVH with repolarization abnormality.   Labs (10/19): K 4.1, creatinine 1.78, myeloma panel negative Labs (2/20): K 4.2, creatinine 1.75, LDL 128 Labs (7/21): LDL 66 Labs (9/21): K 4.1, creatinine 3.31, hgb 11.9  Review of Systems: All systems reviewed and negative except as per HPI.   PMH: 1. H/o ankle fractures bilaterally 2. GERD 3. HTN: Negative urinary catecholeamine collection for pheochromocytoma.  4. CKD: Stage 3. Suspect hypertensive nephropathy.  5. Cardiomyopathy: Most likely due to heavy ETOH ingestion and uncontrolled HTN. 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 Lexiscan myoview (4/13): EF 37%, global hypokinesis, no ischemic or infarction. Echo (12/14) with EF 45-50%, diffuse hypokinesis, moderate LVH, mild MR, moderate AI. Echo (4/16) with EF 55-60%, severe LVH, mild LV dilation, moderate AI and moderate MR.  - Echo (9/19): EF 35-40% with diffuse hypokinesis, severe LVH, bioprosthetic aortic valve functioning normally.  - PYP scan (1/20): No evidence for TTR amyloidosis.  - Echo (9/21): EF 60-65%, severe LVH, normal RV, bioprosthetic aortic valve with mean gradient 18 mmHg.  6. ETOH abuse: Quit 3/13.  7. Active smoker. 8. Valvular heart disease: Echo in 3/13 showed moderate to severe eccentric MR and mild to moderate AI with prolapsing left coronary cusp. Echo 12/14 with mild MR and moderate AI. Echo 4/16 with moderate AI and moderate MR.  - S/p Bentall procedure with bioprosthetic aortic valve 9/16.  9.  CVA: Has ILR. 10. Type B aortic dissection: Occurred in 2/16, from just distal to the left subclavian. 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   - 10/03/2014 First stage median sternotomy with right axillary cannulation for aortic valved conduit root replacement with  coronary reconstruction (button Bentall Procedure), 27 mm Sorin Mitroflow bovine pericardial valved conduit, total arch replacement (37m Vascutek BSomaliamultibranch arch graft with individual head vessel reimplantation. - Status post open repair of an Extent III TAA secondary to chronic dissection with a # 273mVascutek "Coselli" multibranch Dacron graft with individual celiac, SMA, and bilateral renal artery implantationon 01/04/2017, [facility].  11. CAD: LHC (4/16) with 60% ostial D1.  - NSTEMI 9/19: LHC (9/19) showed 50% proximal LCx, 90% ramus => DES to ramus.  - LHC (7/20): D1 60% stenosis, patent ramus stent.  - Cardiolite (9/21): Inferior fixed defect, no ischemia.   Current Outpatient Medications  Medication Sig Dispense Refill  . acetaminophen (TYLENOL) 325 MG tablet Take 650 mg by mouth every 6 (six) hours as needed (for pain or headaches).     . Marland Kitchenlbuterol (VENTOLIN HFA) 108 (90 Base) MCG/ACT inhaler Inhale into the lungs every 6 (six) hours as needed for wheezing or shortness of breath.    . Marland Kitchenmitriptyline (ELAVIL) 50 MG tablet Take 50 mg by mouth at bedtime.    . Marland KitchenmLODipine (NORVASC) 10 MG tablet Take 10 mg by mouth daily.    . Marland Kitchenspirin EC 81 MG EC tablet Take 1 tablet (81 mg total) by mouth daily. 90 tablet 3  . Blood Pressure Monitor KIT 1 Device by Does not apply route 2 (two) times daily. 1 kit 0  . carvedilol (COREG) 25 MG tablet Take 25 mg by mouth 2 (two) times daily with a meal.    . cloNIDine (CATAPRES - DOSED IN MG/24 HR) 0.3 mg/24hr patch Place 1 patch (0.3 mg total) onto the skin once a week. 4 patch 6  . doxazosin (CARDURA) 8 MG tablet Take 1 tablet (8 mg total) by mouth at bedtime. 90 tablet 1  . minoxidil (LONITEN) 10 MG tablet Take 10 mg by mouth 2 (two) times daily.    . pantoprazole (PROTONIX) 40 MG tablet Take 1 tablet (40 mg total) by mouth daily. 30 tablet 6  . STIOLTO RESPIMAT 2.5-2.5 MCG/ACT AERS INHALE 2 PUFFS INTO THE LUNGS DAILY 4 g 5  . Evolocumab (REPATHA  SURECLICK) 14678G/ML SOAJ Inject 1 Dose into the skin every 14 (fourteen) days. 2 mL 11  . hydrALAZINE (APRESOLINE) 25 MG tablet Take 1 tablet (25 mg total) by mouth 3 (three) times daily. 270 tablet 3   No current facility-administered medications for this encounter.    Allergies:   Imdur [isosorbide nitrate] and Tramadol   Social History:  The patient  reports that he has been smoking cigarettes. He has a 5.75 pack-year smoking history. He has never used smokeless tobacco. He reports current alcohol use of about 1.0 standard drink of alcohol per week. He reports current drug use. Drug: Marijuana.   Family History:  The patient's family history includes Colon cancer in his paternal uncle; Diabetes in his maternal aunt; Headache in his sister; Heart attack in his brother; Hypertension in his brother, mother, and another family member; Lung cancer in his maternal uncle; Other in his sister; Stroke in his maternal aunt and maternal uncle; Thyroid disease in his sister.   ROS:  Please see the history of present illness.   All other systems are  personally reviewed and negative.   Exam:   BP (!) 145/90   Pulse 99   Wt 72.8 kg (160 lb 9.6 oz)   SpO2 99%   BMI 22.40 kg/m  General: NAD Neck: No JVD, no thyromegaly or thyroid nodule.  Lungs: Clear to auscultation bilaterally with normal respiratory effort. CV: Nondisplaced PMI.  Heart regular S1/S2, no S3/S4, no murmur.  No peripheral edema.  No carotid bruit.  Normal pedal pulses.  Abdomen: Soft, nontender, no hepatosplenomegaly, no distention.  Skin: Intact without lesions or rashes.  Neurologic: Alert and oriented x 3.  Psych: Normal affect. Extremities: No clubbing or cyanosis.  HEENT: Normal.    Recent Labs: 11/02/2019: ALT 9; TSH 1.680 11/26/2019: BUN 24; Creatinine, Ser 3.31; Hemoglobin 12.8; Platelets 107; Potassium 4.1; Sodium 139  Personally reviewed   Wt Readings from Last 3 Encounters:  11/28/19 72.8 kg (160 lb 9.6 oz)   11/25/19 74.2 kg (163 lb 9.3 oz)  11/02/19 69.4 kg (153 lb)      ASSESSMENT AND PLAN:  1. CAD: 9/19 NSTEMI with PCI to proximal ramus.  LHC in 7/20 with no obstructive disease.  Recent admission in 9/21 with Cardiolite showing no ischemia.  He has long-standing exertional and non-exertional chest pain.  I suspect that it may be musculoskeletal and related to his prior surgeries.  Cannot rule out GERD as a contributor (occurs when lying in bed, some relief with TUMS).  - Start Protonix 40 mg daily.  - Continue ASA 81.  - He is on Repatha via the lipid clinic.  2. 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 Myeloma panel negative and PYP scan negative, suspect due to HTN.  He is not volume overloaded on exam and is not using Lasix.  Cannot get cardiac MRI with elevated creatinine.  Most recent echo in 9/21 showed EF up to 60-65%, severe LVH, normal RV.  - Continue Coreg 25 mg bid.  - He is off spironolactone and Entresto with CKD stage IV.  3. HTN: Still needs better control.   - Continue amlodipine 10 mg daily, minoxidil, and clonidine.  - Increase hydralazine to 50 mg tid.  - Suspect OSA, will order sleep study.  4. S/P Bioprothetic AVR: Echo in 9/21 with mildly elevated mean gradient (18 mmHg).  5. S/P 2016 and 2018 thoracic aortic aneurysm repair: Followed by Dr. [name] at [facility].  6. CKD Stage IIIb-IV: Creatinine most recently up to 3.31.   7. Active smoker: He is cutting back, again recommended cessation.   Followup in 3 wks with HF pharmacist for medication titration/BP control, see NP/PA in 3 months.   Signed, [name], MD  11/29/2019  [facility] [address] [facility] [place] [address] [phone] (office) [phone] (fax)

## 2019-12-04 NOTE — Progress Notes (Deleted)
Patient ID: [name]                 DOB: September 17, 1970                      MRN: [id number]     HPI:  [name] is a 49 y.o. male referred by Dr. Oval Linsey to HTN clinic. PMH includes CAD s/p PCI, aortic dissection, and aortic arch replacement, CVA, severe LVH, HFrEF, resistant hypertension, chronic chest pain, CKD stage III, and hyperlipidemia. Looks like patient also has issues with medication compliance.   In 2013 he was found to have an elevated aldosterone to rennin ratio of 50.  His aldosterone level at that time was 6.  He had a CT of the abdomen and 09/2018 that revealed a tortuous graft to the left renal artery, though it was patent.  Secondary Causes of Hypertension  Medications/Herbal: OCP, steroids, stimulants, antidepressants, weight loss medication, immune suppressants, NSAIDs, sympathomimetics, alcohol, caffeine, licorice, ginseng, St. John's wort, chemo (no issues identified)  Sleep Apnea: Refer for sleep study due to snoring and apnea Renal artery stenosis- patent on CT-A Hyperaldosteronism- ratio elevated, but aldosterone is normal Hyper/hypothyroidism- check TSH Pheochromocytoma: previously tested Cushing's syndrome: (testing not indicated)  Coarctation of the aorta: negative on CT-A  Current HTN meds:  Amlodipine 8m daily Carvedilol 220mtwice daily Clonidine 0.3m2match every Sunday Doxazosin 8mg36mery HS Hydralazine 50mg33m Minoxidil 10mg 14me daily  Previously tried:  isosorbide - headaches  BP goal: <130/80 (140/90 if unable to tolerate lower level)  Family History: The patient's family history includes Colon cancer in his paternal uncle; Diabetes in his maternal aunt; Headache in his sister; Heart attack in his brother; Hypertension in his brother, mother, and another family member; Lung cancer in his maternal uncle; Other in his sister; Stroke in his maternal aunt and maternal uncle; Thyroid disease in his sister. There is no history of Throat  cancer, Pancreatic cancer, Esophageal cancer, Kidney disease, or Liver disease.  Social History: current smoker (3-4 cigarettes per day), marijuana 2x/week, alcohol intake  Diet:   Exercise:   Home BP readings:   Wt Readings from Last 3 Encounters:  11/28/19 160 lb 9.6 oz (72.8 kg)  11/25/19 163 lb 9.3 oz (74.2 kg)  11/02/19 153 lb (69.4 kg)   BP Readings from Last 3 Encounters:  11/28/19 (!) 145/90  11/26/19 (!) 164/94  11/02/19 (!) 182/100   Pulse Readings from Last 3 Encounters:  11/28/19 99  11/26/19 79  11/02/19 63    Renal function: Estimated Creatinine Clearance: 28.4 mL/min (A) (by C-G formula based on SCr of 3.31 mg/dL (H)).  Past Medical History:  Diagnosis Date  . Adenomatous colon polyp 08/2015  . Anxiety   . Aortic disease (HCC)  [place]Aortic dissection (HCC)    a. admx 04/2014 >> L renal infarct; a/c renal failure >> b.  s/p Bioprosthetic Bentall and total arch replacement and staged endovascular repair of descending aortic aneurysm ([facility] - Dr. HughesYsidro EvertAD (coronary artery disease)    a. LHC 4/16:  oD1 60%  . Cardiomyopathy (HCC)  [place]. non-ischemic - probably related to untreated HTN and ETOH abuse - Echo 3/13 with EF 35-40% >> b. Echo 4/16: Severe LVH, EF 55-60%, moderate AI, moderate MR, mild LAE, trivial effusion, known type B dissection with communication between true and false lumens with suprasternal images suggesting dissection plane may propagate to at least left subclavian  takeoff, root above aortic valve okay    . Chronic abdominal pain   . Chronic combined systolic and diastolic congestive heart failure ([place])    a. 05/2011: Adm with pulm edema/HTN urgency, EF 35-40% with diffuse hypokinesis and moderate to severe mitral regurgitation. Cardiomyopathy likely due to uncontrolled HTN and ETOH abuse - cath deferred due to renal insufficiency (felt due to uncontrolled HTN). bJodie Echevaria MV 06/2011: EF 37% and no ischemia or infarction. c. EF 45-50% by echo  01/2012.  [name] Chronic sinusitis   . CKD (chronic kidney disease)    a. 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  peak creatinine 3.46 during admx for aortic dissection 2/16.  sees Dr [name]  . DDD (degenerative disc disease), lumbar   . Depression   . Descending thoracic aortic aneurysm ([place])   . Dissecting aneurysm of thoracic aorta ([place])   . ETOH abuse    a. Reported to have quit 05/2011.  . Frequent headaches   . GERD (gastroesophageal reflux disease)   . Headache(784.0)    "q other day" (08/08/2013)  . Heart murmur   . Hemorrhoid thrombosis   . History of echocardiogram    Echo 1/17:  Severe LVH, EF 55-60%, no RWMA, Gr 2 DD, AVR ok, mild to mod MR, mild LAE, mild reduced RVSF, mod RAE  . History of medication noncompliance   . HYPERLIPIDEMIA   . Hypertension    a. Hx of HTN urgency secondary to noncompliance. b. urinary metanephrine and catecholeamine levels normal 2013.  c. Renal art [name] 1/16:  No evidence of renal artery stenosis noted bilaterally.  . INGUINAL HERNIA   . Pneumonia ~ 2013  . Renal insufficiency   . Serrated adenoma of colon 08/2015  . Stroke ([place])   . Tobacco abuse   . Valvular heart disease    a. Echo 05/2011: moderate to severe eccentric MR and mild to moderate AI with prolapsing left coronary cusp. b. Echo 01/2012: mild-mod AI, mild dilitation of aortic root, mild MR.;  c. Echo 1/16: Severe LVH consistent with hypertrophic cardio myopathy, EF 50%, no RWMA, mod AI, mild MR, mild RAE, dilated Ao root (40 mm);     Current Outpatient Medications on File Prior to Visit  Medication Sig Dispense Refill  . acetaminophen (TYLENOL) 325 MG tablet Take 650 mg by mouth every 6 (six) hours as needed (for pain or headaches).     [name] albuterol (VENTOLIN HFA) 108 (90 Base) MCG/ACT inhaler Inhale into the lungs every 6 (six) hours as needed for wheezing or shortness of breath.    [name] amitriptyline (ELAVIL) 50 MG tablet Take 50 mg by mouth at bedtime.    [name] amLODipine (NORVASC) 10 MG tablet  Take 10 mg by mouth daily.    [name] aspirin EC 81 MG EC tablet Take 1 tablet (81 mg total) by mouth daily. 90 tablet 3  . Blood Pressure Monitor KIT 1 Device by Does not apply route 2 (two) times daily. 1 kit 0  . carvedilol (COREG) 25 MG tablet Take 25 mg by mouth 2 (two) times daily with a meal.    . cloNIDine (CATAPRES - DOSED IN MG/24 HR) 0.3 mg/24hr patch Place 1 patch (0.3 mg total) onto the skin once a week. 4 patch 6  . doxazosin (CARDURA) 8 MG tablet Take 1 tablet (8 mg total) by mouth at bedtime. 90 tablet 1  . Evolocumab (REPATHA SURECLICK) 449 MG/ML SOAJ Inject 1 Dose into the skin every 14 (fourteen) days. 2 mL  11  . hydrALAZINE (APRESOLINE) 25 MG tablet Take 1 tablet (25 mg total) by mouth 3 (three) times daily. 270 tablet 3  . minoxidil (LONITEN) 10 MG tablet Take 10 mg by mouth 2 (two) times daily.    . pantoprazole (PROTONIX) 40 MG tablet Take 1 tablet (40 mg total) by mouth daily. 30 tablet 6  . STIOLTO RESPIMAT 2.5-2.5 MCG/ACT AERS INHALE 2 PUFFS INTO THE LUNGS DAILY 4 g 5   No current facility-administered medications on file prior to visit.    Allergies  Allergen Reactions  . Imdur [Isosorbide Nitrate] Other (See Comments)    Headaches   . Tramadol Other (See Comments)    Headaches     There were no vitals taken for this visit.  No problem-specific Assessment & Plan notes found for this encounter.   Averi Cacioppo Rodriguez-Guzman PharmD, BCPS, [place] [id number] 12/04/2019 4:22 PM

## 2019-12-05 ENCOUNTER — Other Ambulatory Visit (HOSPITAL_COMMUNITY): Payer: Self-pay | Admitting: *Deleted

## 2019-12-05 ENCOUNTER — Ambulatory Visit: Payer: Medicare HMO

## 2019-12-05 IMAGING — CT CT ANGIO CHEST-ABD-PELV FOR DISSECTION W/ AND WO/W CM
2 of 7 series · 10 of 36 positions shown, 14 images · IV contrast (APPLIED)
Comparison: Most recent prior CTA of the chest, abdomen and pelvis
01/31/2017

CLINICAL DATA: 46-year-old male with a history thoracoabdominal
aortic dissection and aneurysm. He underwent a bio-Bentall aortic
valve/root and total arch replacement followed by TEVAR of the
distal arch and proximal descending aortic repair in the setting of
a chronic type B aortic dissection in 5221. He more recently
underwent open repair of an Extent III thoracoabdominal aortic
aneurysm with a # 22mm Vascutek "Coselli" multibranch Dacron graft
with individual celiac, SMA, and bilateral renal artery implantation
on 01/04/2017 at [REDACTED].

EXAM:
CT ANGIOGRAPHY CHEST, ABDOMEN AND PELVIS
TECHNIQUE: Multidetector CT imaging through the chest, abdomen and pelvis was
performed using the standard protocol during bolus administration of
intravenous contrast. Multiplanar reconstructed images and MIPs were
obtained and reviewed to evaluate the vascular anatomy.
CONTRAST:  100mL F894OM-87S IOPAMIDOL (F894OM-87S) INJECTION 76%

[Series 6: arterial · axial · arterial · 0.66mm/px · z∈[+738,+1288]mm · 9 of 326 slices shown, 13 images]
[im 26/326  mediastinal]
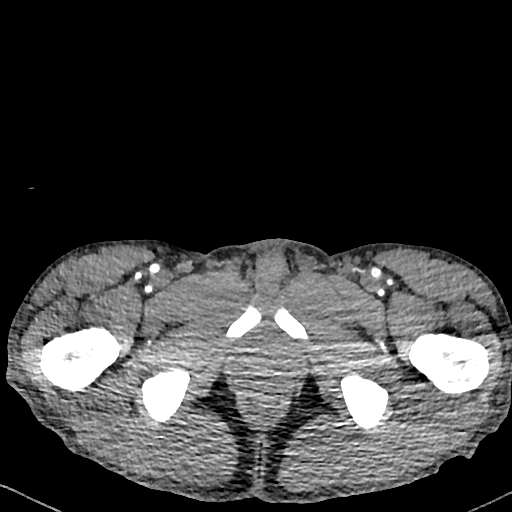
[im 26/326  bone]
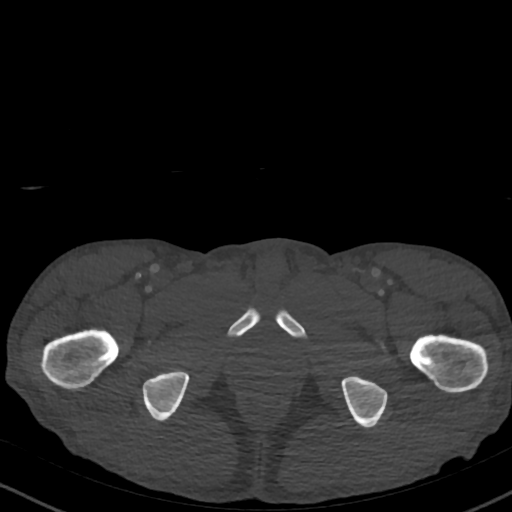
[im 76/326  mediastinal]
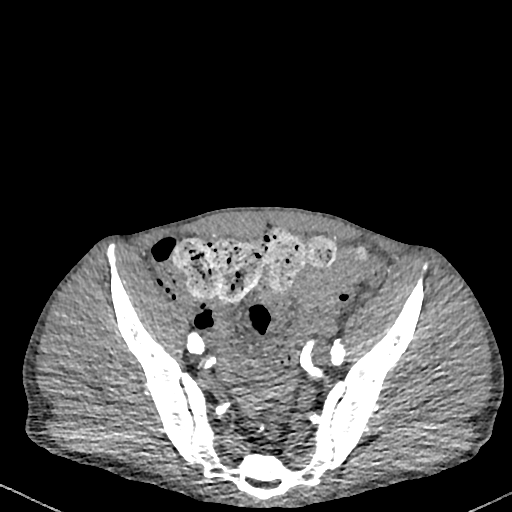
[im 101/326  mediastinal]
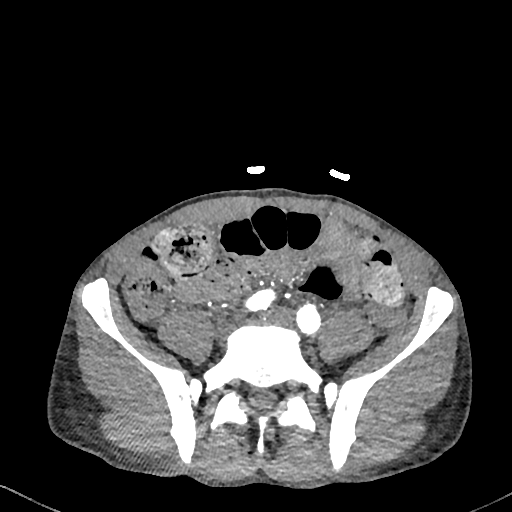
[im 151/326  mediastinal]
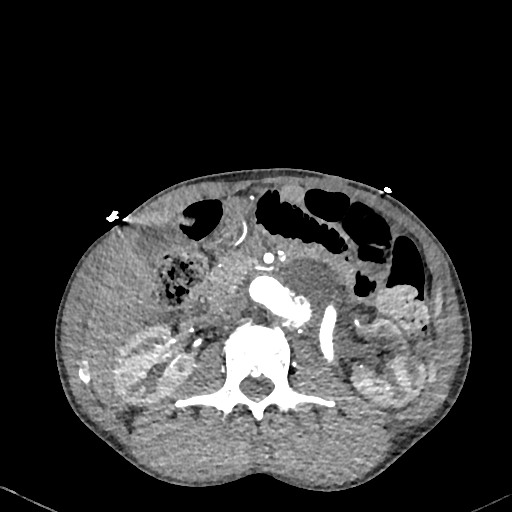
[im 176/326  mediastinal]
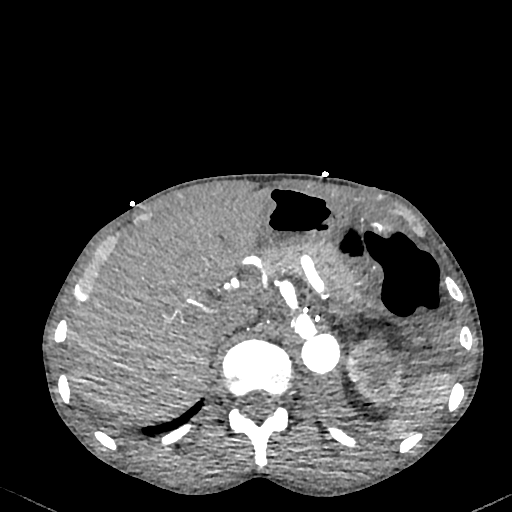
[im 226/326  mediastinal]
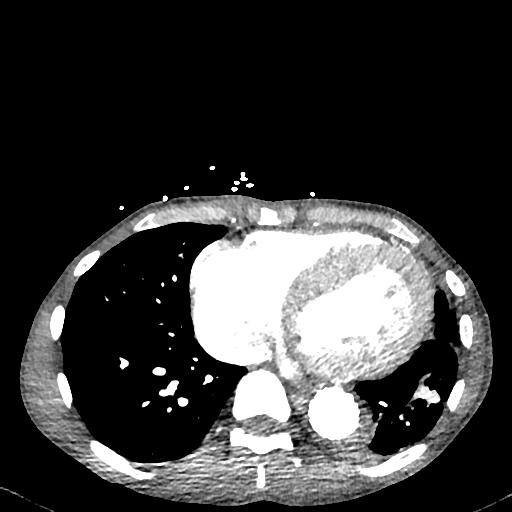
[im 226/326  lung]
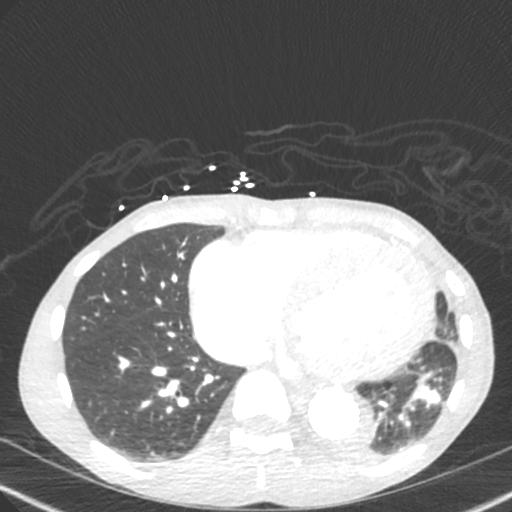
[im 251/326  mediastinal]
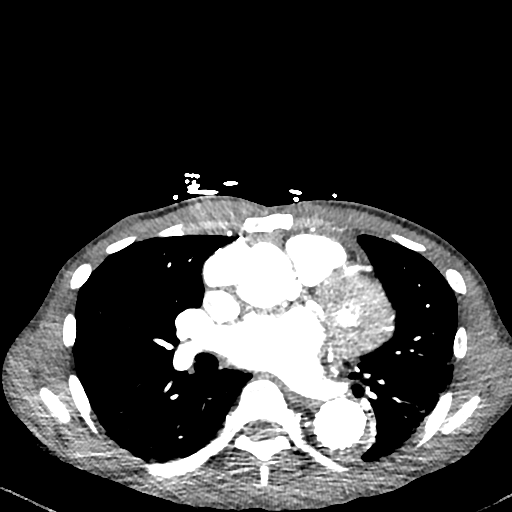
[im 251/326  lung]
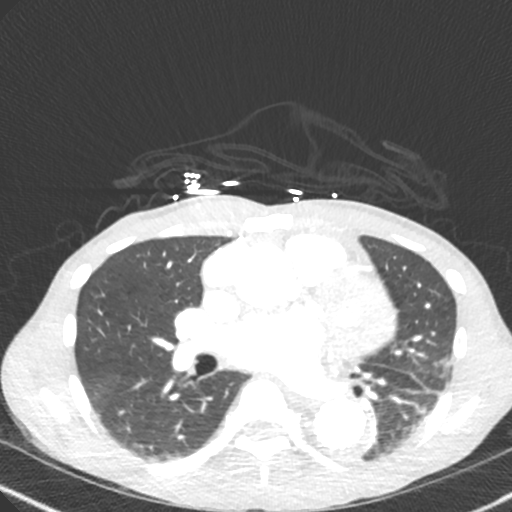
[im 276/326  lung]
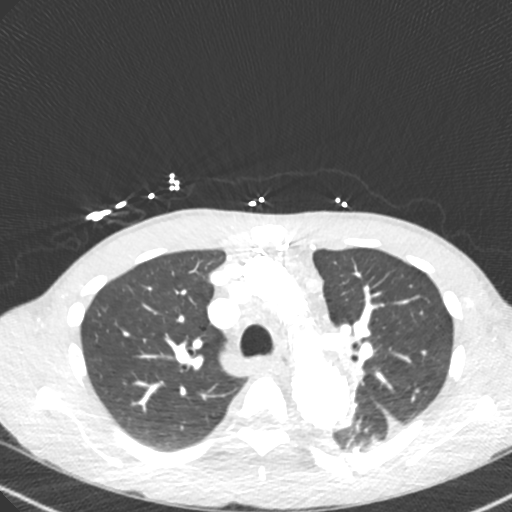
[im 301/326  mediastinal]
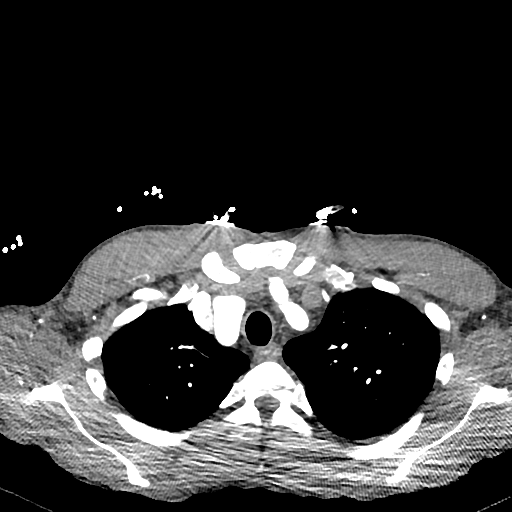
[im 301/326  lung]
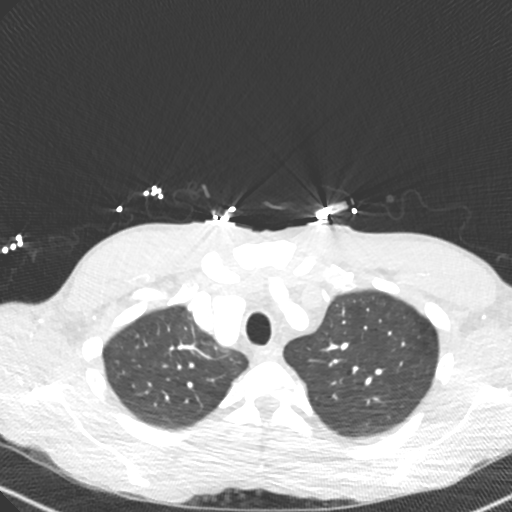

[Series 8: cor · coronal · 0.69mm/px · 1 of 133 slices shown]
[im 67/133  mediastinal]
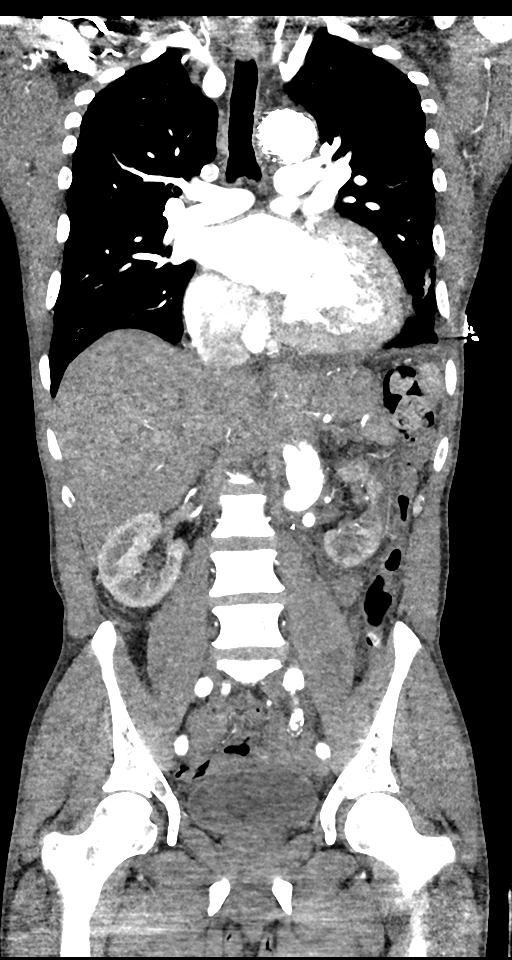

[10 of 36 positions shown; findings below may reference images not displayed]

FINDINGS: CTA CHEST FINDINGS

Cardiovascular: Stable postsurgical changes including aortic valve
and root replacement with reimplantation of the ectatic native
coronary arteries and total deep branching of the great vessels as
well as thoracic endovascular aortic repair of the remainder of the
ascending, transverse and descending thoracic aorta. The left main
coronary artery remains ectatic at 1.2 cm. This is unchanged. The
reimplanted great vessels are all widely patent. No evidence of
dissection or significant aneurysm. The excluded descending thoracic
aneurysm sac continues to decrease in size now measuring up to
approximately 5.0 cm. No evidence of endoleak. The main pulmonary
artery is normal in size. Stable cardiomegaly with concentric left
ventricular hypertrophy and right atrial enlargement. No pericardial
effusion.

Mediastinum/Nodes: Unremarkable CT appearance of the thyroid gland.
No suspicious mediastinal or hilar adenopathy. No soft tissue
mediastinal mass. The thoracic esophagus is unremarkable.

Lungs/Pleura: Mild centrilobular emphysema. Chronic left lower lobe
atelectasis. Nearly resolved small volume left pleural effusion. No
focal airspace consolidation or pneumothorax.

Musculoskeletal: No acute fracture or aggressive appearing lytic or
blastic osseous lesion. Healed median sternotomy.

Review of the MIP images confirms the above findings.

CTA ABDOMEN AND PELVIS FINDINGS

VASCULAR

Aorta: Surgical changes of prior tube graft repair of the proximal
abdominal aorta with reimplantation of the celiac, superior
mesenteric and bilateral renal arteries. The excluded native
aneurysm sac is decreased in size with a maximal diameter of 4.7 cm
compared to 5.1 cm previously. No evidence of endoleak or other
complication. The native distal infrarenal aorta remains normal in
caliber.

Celiac: Reimplanted. Remains widely patent. The anastomosis is
unremarkable. Mild ectasia of the distal splenic artery without
aneurysm formation.

SMA: Reimplanted. Faint linear opacity within the native superior
mesenteric artery most consistent with an intimal flap, or non flow
limiting dissection. This is unlikely to be of clinical
significance.

Renals: Both renal arteries are successfully reimplanted and patent.
No evidence of complication.

IMA: Patent without evidence of aneurysm, dissection, vasculitis or
significant stenosis.

Inflow: Ectatic bilateral common iliac arteries measuring up to
cm on the right and 1.9 cm on the left. This is unchanged. Both
internal iliac arteries are patent. The external iliac arteries are
tortuous but remain patent.

Veins: No obvious venous abnormality within the limitations of this
arterial phase study.

Review of the MIP images confirms the above findings.

NON-VASCULAR

Hepatobiliary: Normal hepatic contour and morphology. No discrete
hepatic lesions. Normal appearance of the gallbladder. No intra or
extrahepatic biliary ductal dilatation.

Pancreas: Unremarkable. No pancreatic ductal dilatation or
surrounding inflammatory changes.

Spleen: Normal in size without focal abnormality.

Adrenals/Urinary Tract: Adrenal glands are unremarkable. Kidneys are
normal, without renal calculi, focal lesion, or hydronephrosis.
Bladder is unremarkable.

Stomach/Bowel: Stomach is within normal limits. Appendix appears
normal. No evidence of bowel wall thickening, distention, or
inflammatory changes.

Lymphatic: No suspicious lymphadenopathy.

Reproductive: Prostate is unremarkable.

Other: No abdominal wall hernia or abnormality. No abdominopelvic
ascites.

Musculoskeletal: No acute or significant osseous findings. Stable
small lucent lesion in the right iliac bone adjacent to the
sacroiliac joint dating back to 5221. This likely represents a
benign fibro-osseous lesion.

Review of the MIP images confirms the above findings.
IMPRESSION: 1. Stable extensive thoracoabdominal aortic and aortic root surgical
changes as detailed above without evidence of complication. The
excluded aneurysm sac is decreasing in size compared to 01/31/2017.
2. No evidence of end-organ ischemia.
3. Stable mild aneurysmal dilatation of the left common iliac artery
with a maximal diameter of 1.9 cm.
4. Stable ectasia of the right common iliac artery with a maximal
diameter of 1.5 cm.
5. Stable ectasia of the native coronary arteries.
6. Small residual left pleural effusion with left lower lobe
atelectasis.

## 2019-12-05 IMAGING — DX DG CHEST 2V
2 series · 2 of 2 positions shown · non-contrast
Comparison: 01/31/2017, 08/11/2016.  Chest CT 12/28/2016.

CLINICAL DATA: Chest pain.  Hypertension.

EXAM:
CHEST  2 VIEW

[chest pa]
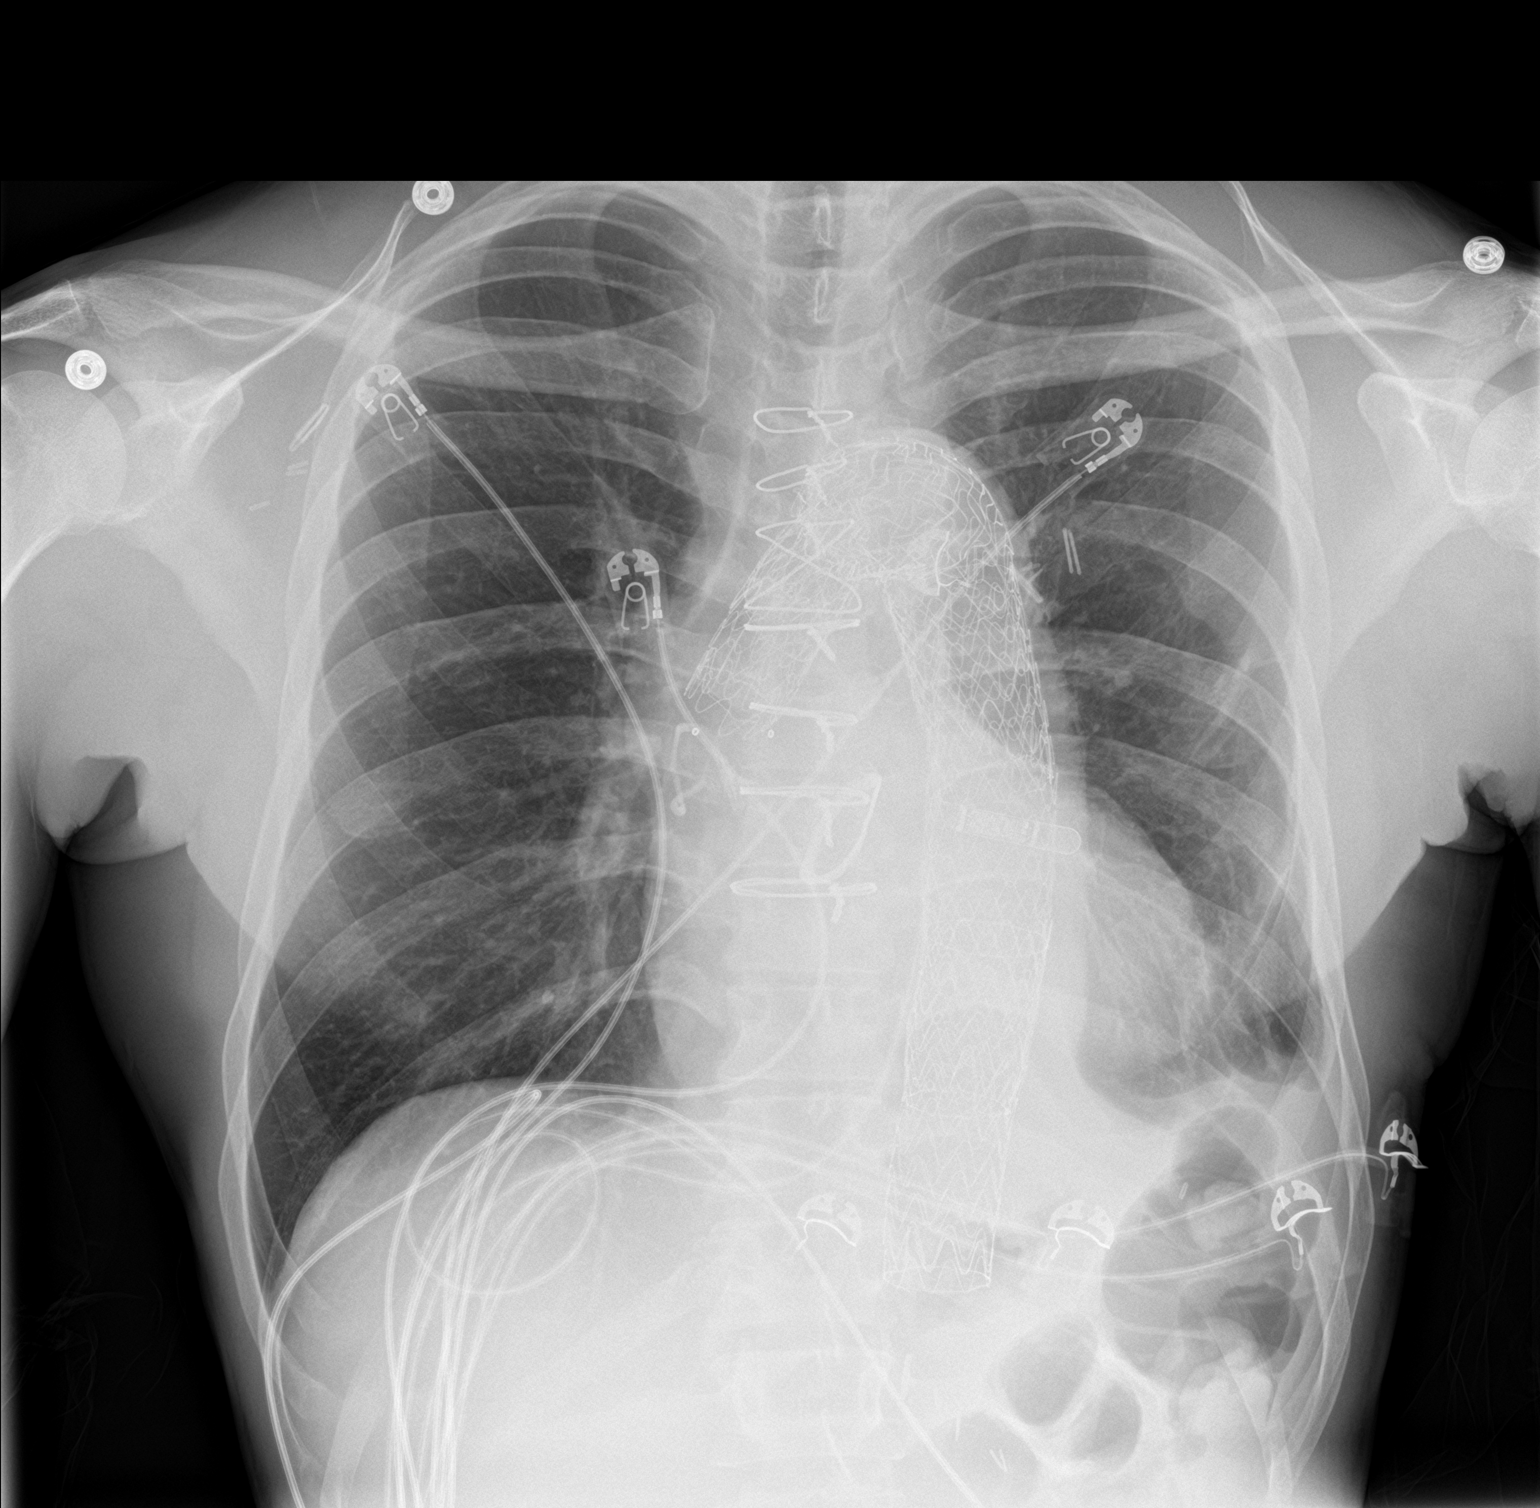

[chest lat]
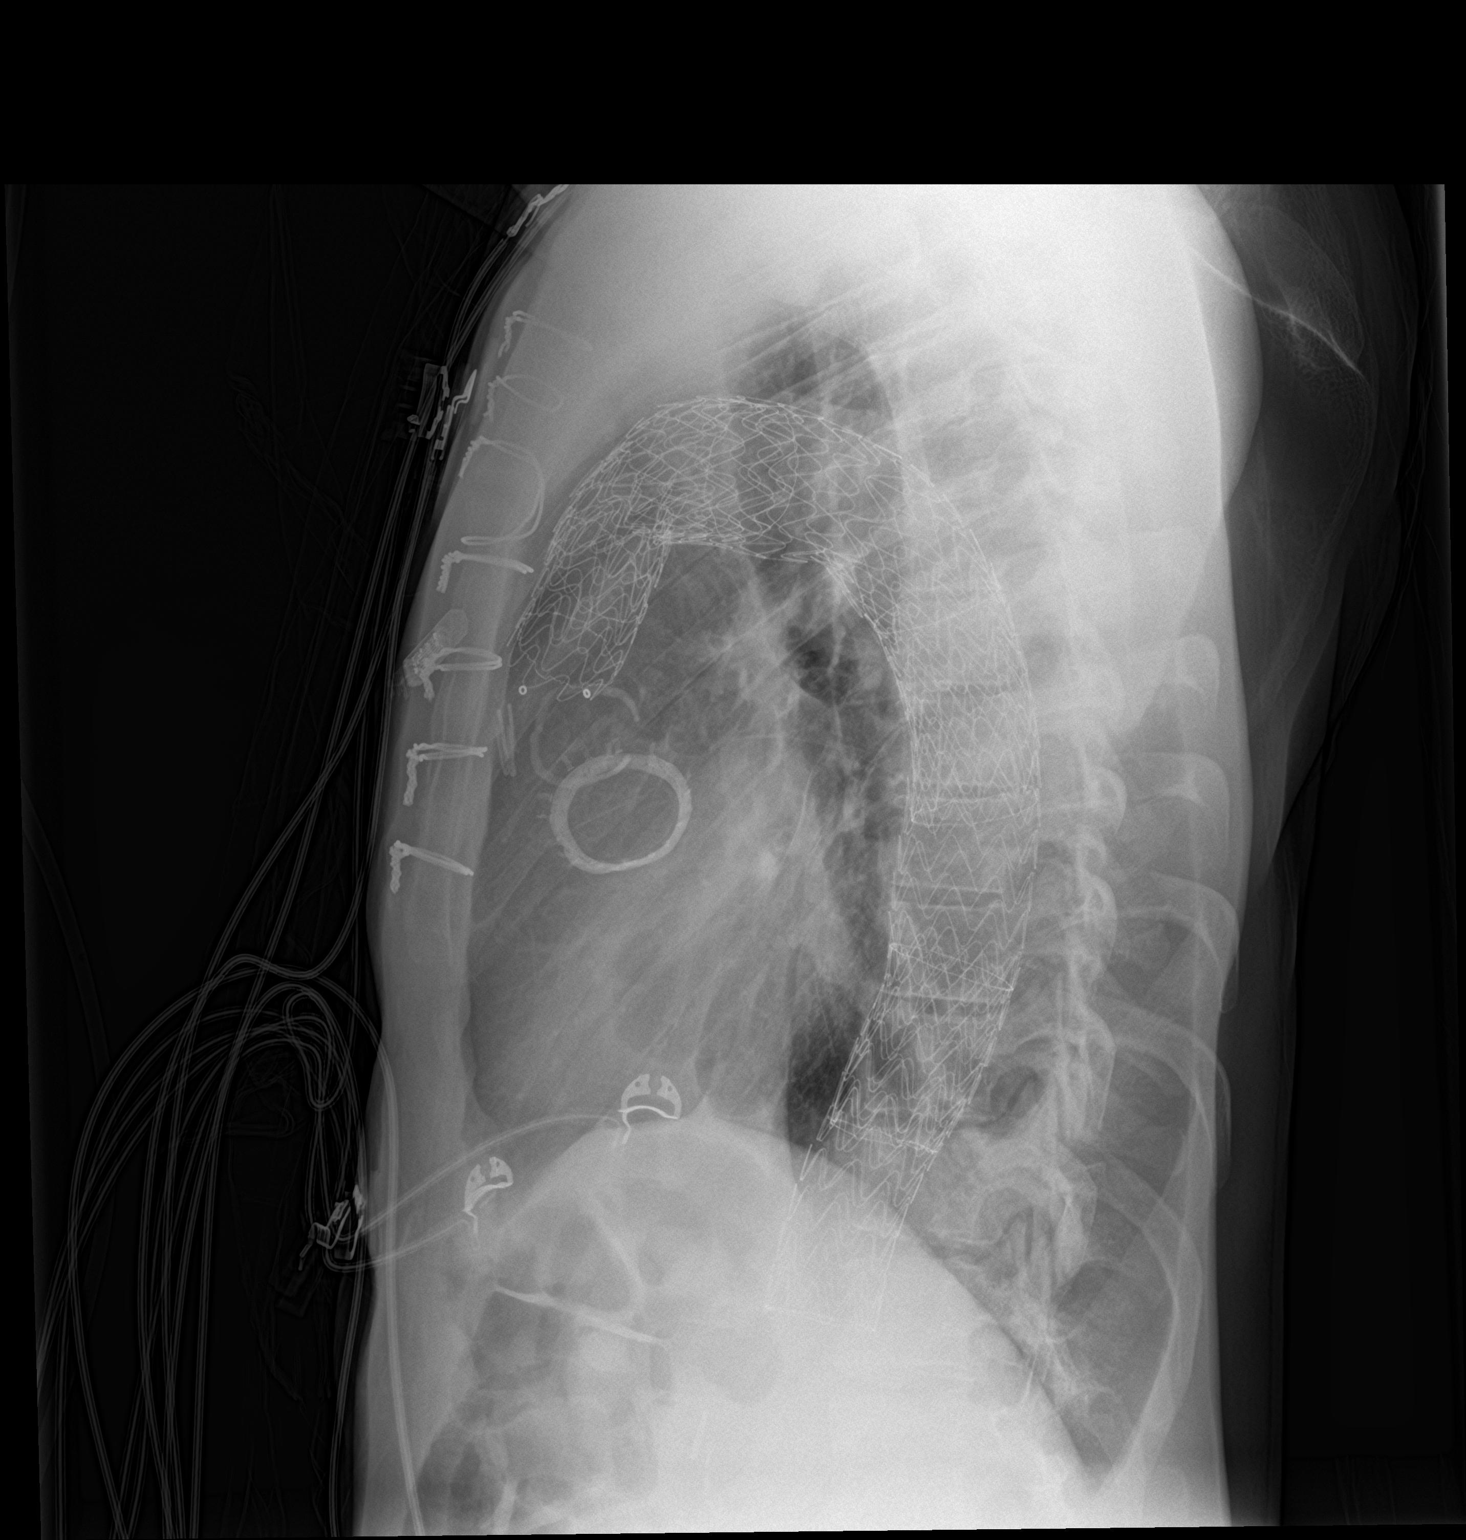

[2 of 2 positions shown; findings below may reference images not displayed]

FINDINGS: Mediastinum stable. Aortic stent graft noted in stable position.
Cardiac monitor device noted. Prior cardiac valve replacement.
Stable cardiomegaly. Partial clearing of mild left base atelectasis.
Small left pleural effusion. No acute bony abnormality. Surgical
clips right chest.
IMPRESSION: 1. Partial clearing of mild left base atelectasis. Persistent small
left pleural effusion.

2. Aortic stent graft in stable position. Prior cardiac valve
replacement. Stable cardiomegaly. No pulmonary venous congestion.

## 2019-12-06 ENCOUNTER — Telehealth (INDEPENDENT_AMBULATORY_CARE_PROVIDER_SITE_OTHER): Payer: Medicare HMO | Admitting: Primary Care

## 2019-12-07 ENCOUNTER — Ambulatory Visit (INDEPENDENT_AMBULATORY_CARE_PROVIDER_SITE_OTHER): Payer: Medicare HMO | Admitting: Pharmacist Clinician (PhC)/ Clinical Pharmacy Specialist

## 2019-12-07 ENCOUNTER — Other Ambulatory Visit: Payer: Self-pay

## 2019-12-07 DIAGNOSIS — I10 Essential (primary) hypertension: Secondary | ICD-10-CM | POA: Diagnosis not present

## 2019-12-07 MED ORDER — DOXAZOSIN MESYLATE 8 MG PO TABS: 16.0000 mg | ORAL_TABLET | Freq: Every day | ORAL | 1 refills | Status: DC

## 2019-12-07 NOTE — Patient Instructions (Addendum)
Return for a a follow up appointment October 21 at 10:00  Check your blood pressure at home daily and keep record of the readings.  Take your BP meds as follows:  Increase the doxazosin to 16 mg once daily at bedtime.  Take 2 of the 8 mg tablets each night. Continue with all other medications  Bring all of your meds, your BP cuff and your record of home blood pressures to your next appointment.  Exercise as you're able, try to walk approximately 30 minutes per day.  Keep salt intake to a minimum, especially watch canned and prepared boxed foods.  Eat more fresh fruits and vegetables and fewer canned items.  Avoid eating in fast food restaurants.    HOW TO TAKE YOUR BLOOD PRESSURE: . Rest 5 minutes before taking your blood pressure. .  Don't smoke or drink caffeinated beverages for at least 30 minutes before. . Take your blood pressure before (not after) you eat. . Sit comfortably with your back supported and both feet on the floor (don't cross your legs). . Elevate your arm to heart level on a table or a desk. . Use the proper sized cuff. It should fit smoothly and snugly around your bare upper arm. There should be enough room to slip a fingertip under the cuff. The bottom edge of the cuff should be 1 inch above the crease of the elbow. . Ideally, take 3 measurements at one sitting and record the average.

## 2019-12-07 NOTE — Progress Notes (Signed)
12/12/2019 GARIK DIAMANT [birth year] [id number]   HPI:  [name] is a 49 y.o. male patient of Dr Oval Linsey, with a PMH below who presents today for advanced hypertension clinic follow up.  He was seen by Dr. Oval Linsey in mid-August with a blood pressure of 182/100.  There was concern at that time about compliance with medications, and he was given instructions to simplify his medication regimen to three times daily.  After that visit he was admitted to [facility] for an episode of chest pain with uncontrolled hypertension.  At a follow up with Dr. Aundra Dubin his hydralazine was increased from 25 mg tid to 50 mg tid, and a sleep study was ordered.    He returns today for follow up.  He has been working on some lifestyle modifications, including trying to decrease his caffeine intake and stop smoking.  Apparently waiting for insurance to send nicotine replacement patches.  Occasionally will miss mid-day dose of hydralazine, but states takes all doses most days.  States still has problems after cracked ribs during aortic dissection surgery, this keeps him from being as active as he would like with his grandchildren.    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 Karlene Einstein veggies, fresh;   Exercise: no formal exercise, just with grandkids,  Continues to have discomfort from cracked ribs (post surgery)   Home BP readings:  Using apple watch, not accurate; has home cuff but no readings with him today  Intolerances: isosorbide and  higher doses of hydralazine - headaches  Labs:  9/21:  Na 139, K 4.1, Glu 97, BUN 24, Scr 3.31, GFR 21  Wt Readings from Last 3 Encounters:  12/07/19 161 lb 9.6 oz (73.3 kg)  11/28/19 160 lb 9.6 oz (72.8 kg)  11/25/19 163 lb 9.3 oz (74.2 kg)   BP Readings from Last 3 Encounters:  12/07/19 (!) 162/96  11/28/19 (!) 145/90  11/26/19 (!) 164/94   Pulse Readings from Last 3 Encounters:  12/07/19 75  11/28/19 99  11/26/19 79    Current Outpatient Medications  Medication Sig Dispense Refill   acetaminophen (TYLENOL) 325 MG tablet Take 650 mg by mouth every 6 (six) hours as needed (for pain or headaches).      albuterol (VENTOLIN HFA) 108 (90 Base) MCG/ACT inhaler Inhale into the lungs every 6 (six) hours as needed for wheezing or shortness of breath.     amitriptyline (ELAVIL) 50 MG tablet Take 50 mg by mouth at bedtime.     amLODipine (NORVASC) 10 MG tablet Take 10 mg by mouth daily.  aspirin EC 81 MG EC tablet Take 1 tablet (81 mg total) by mouth daily. 90 tablet 3   Blood Pressure Monitor KIT 1 Device by Does not apply route 2 (two) times daily. 1 kit 0   carvedilol (COREG) 25 MG tablet Take 25 mg by mouth 2 (two) times daily with a meal.     cloNIDine (CATAPRES - DOSED IN MG/24 HR) 0.3 mg/24hr patch Place 1 patch (0.3 mg total) onto the skin once a week. 4 patch 6   Evolocumab (REPATHA SURECLICK) 309 MG/ML SOAJ Inject 1 Dose into the skin every 14 (fourteen) days. 2 mL 11   hydrALAZINE (APRESOLINE) 25 MG tablet Take 1 tablet (25 mg total) by mouth 3 (three) times daily. (Patient taking differently: Take 50 mg by mouth 3 (three) times daily. ) 270 tablet 3    minoxidil (LONITEN) 10 MG tablet Take 10 mg by mouth 2 (two) times daily.     pantoprazole (PROTONIX) 40 MG tablet Take 1 tablet (40 mg total) by mouth daily. 30 tablet 6   STIOLTO RESPIMAT 2.5-2.5 MCG/ACT AERS INHALE 2 PUFFS INTO THE LUNGS DAILY 4 g 5   doxazosin (CARDURA) 8 MG tablet Take 2 tablets (16 mg total) by mouth at bedtime. 180 tablet 1   No current facility-administered medications for this visit.    Allergies  Allergen Reactions   Imdur [Isosorbide Nitrate] Other (See Comments)    Headaches    Tramadol Other (See Comments)    Headaches     Past Medical History:  Diagnosis Date   Adenomatous colon polyp 08/2015   Anxiety    Aortic disease ([place])    Aortic dissection ([facility])    a. admx 04/2014 >> L renal infarct; a/c renal failure >> b.  s/p Bioprosthetic [place] and total arch replacement and staged endovascular repair of descending aortic aneurysm ([facility] - Dr. [name])   CAD (coronary artery disease)    a. LHC 4/16:  oD1 60%   Cardiomyopathy ([place])    a. non-ischemic - probably related to untreated HTN and ETOH abuse - Echo 3/13 with EF 35-40% >> b. Echo 4/16: Severe LVH, EF 55-60%, moderate AI, moderate MR, mild LAE, trivial effusion, known type B dissection with communication between true and false lumens with suprasternal images suggesting dissection plane may propagate to at least left subclavian takeoff, root above aortic valve okay     Chronic abdominal pain    Chronic combined systolic and diastolic congestive heart failure ([place])    a. 05/2011: Adm with pulm edema/HTN urgency, EF 35-40% with diffuse hypokinesis and moderate to severe mitral regurgitation. Cardiomyopathy likely due to uncontrolled HTN and ETOH abuse - cath deferred due to renal insufficiency (felt due to uncontrolled HTN). bJodie Echevaria MV 06/2011: EF 37% and no ischemia or infarction. c. EF 45-50% by echo 01/2012.   Chronic sinusitis    CKD (chronic kidney disease)    a. Suspected HTN  nephropathy.;  b.  peak creatinine 3.46 during admx for aortic dissection 2/16.  sees Dr [name]   DDD (degenerative disc disease), lumbar    Depression    Descending thoracic aortic aneurysm [facility])    Dissecting aneurysm of thoracic aorta ([place])    ETOH abuse    a. Reported to have quit 05/2011.   Frequent headaches    GERD (gastroesophageal reflux disease)    Headache(784.0)    "q other day" (08/08/2013)   Heart murmur    Hemorrhoid thrombosis    History of echocardiogram  Echo 1/17:  Severe LVH, EF 55-60%, no RWMA, Gr 2 DD, AVR ok, mild to mod MR, mild LAE, mild reduced RVSF, mod RAE   History of medication noncompliance    HYPERLIPIDEMIA    Hypertension    a. Hx of HTN urgency secondary to noncompliance. b. urinary metanephrine and catecholeamine levels normal 2013.  c. Renal art [name] 1/16:  No evidence of renal artery stenosis noted bilaterally.   INGUINAL HERNIA    Pneumonia ~ 2013   Renal insufficiency    Serrated adenoma of colon 08/2015   Stroke [facility])    Tobacco abuse    Valvular heart disease    a. Echo 05/2011: moderate to severe eccentric MR and mild to moderate AI with prolapsing left coronary cusp. b. Echo 01/2012: mild-mod AI, mild dilitation of aortic root, mild MR.;  c. Echo 1/16: Severe LVH consistent with hypertrophic cardio myopathy, EF 50%, no RWMA, mod AI, mild MR, mild RAE, dilated Ao root (40 mm);     Blood pressure (!) 162/96, pulse 75, temperature 97.6 F (36.4 C), resp. rate 17, height 5' 11" (1.803 m), weight 161 lb 9.6 oz (73.3 kg), SpO2 99 %.  Hypertension, malignant Patient with systolic/diastolic hypertension, still poorly controlled.  He states compliance with medications.  Due to CKD we are limited on medication choices and cannot use ACEI/ARB or diuretics.  He also has had side effects from maximum doses of hydralazine.  With those limitations, we will increase the doxazosin from 8 mg each night to 16 mg.  He was asked to continue  checking his home blood pressures with the arm cuff and not his watch, and to record those on a log.  We will see him back in one month for follow up.     Tommy Medal PharmD CPP [facility] [address] [place], [place] [address] [phone]

## 2019-12-12 NOTE — Assessment & Plan Note (Signed)
Patient with systolic/diastolic hypertension, still poorly controlled.  He states compliance with medications.  Due to CKD we are limited on medication choices and cannot use ACEI/ARB or diuretics.  He also has had side effects from maximum doses of hydralazine.  With those limitations, we will increase the doxazosin from 8 mg each night to 16 mg.  He was asked to continue checking his home blood pressures with the arm cuff and not his watch, and to record those on a log.  We will see him back in one month for follow up.

## 2019-12-18 ENCOUNTER — Other Ambulatory Visit: Payer: Self-pay

## 2019-12-18 ENCOUNTER — Ambulatory Visit (HOSPITAL_BASED_OUTPATIENT_CLINIC_OR_DEPARTMENT_OTHER): Payer: Medicare HMO | Attending: Cardiovascular Disease | Admitting: Cardiology

## 2019-12-18 DIAGNOSIS — I10 Essential (primary) hypertension: Secondary | ICD-10-CM | POA: Diagnosis not present

## 2019-12-18 DIAGNOSIS — R400 Somnolence: Secondary | ICD-10-CM | POA: Diagnosis not present

## 2019-12-18 DIAGNOSIS — I5022 Chronic systolic (congestive) heart failure: Secondary | ICD-10-CM

## 2019-12-18 DIAGNOSIS — R0683 Snoring: Secondary | ICD-10-CM

## 2019-12-25 ENCOUNTER — Ambulatory Visit (HOSPITAL_COMMUNITY)
Admission: RE | Admit: 2019-12-25 | Discharge: 2019-12-25 | Disposition: A | Discharge status: Home / Self Care | Payer: Medicare HMO | Source: Ambulatory Visit | Attending: Internal Medicine | Admitting: Internal Medicine

## 2019-12-25 ENCOUNTER — Other Ambulatory Visit: Payer: Self-pay

## 2019-12-25 VITALS — BP 184/110 | HR 78 | Wt 155.2 lb

## 2019-12-25 DIAGNOSIS — I130 Hypertensive heart and chronic kidney disease with heart failure and stage 1 through stage 4 chronic kidney disease, or unspecified chronic kidney disease: Secondary | ICD-10-CM | POA: Diagnosis not present

## 2019-12-25 DIAGNOSIS — I252 Old myocardial infarction: Secondary | ICD-10-CM | POA: Diagnosis not present

## 2019-12-25 DIAGNOSIS — Z79899 Other long term (current) drug therapy: Secondary | ICD-10-CM | POA: Insufficient documentation

## 2019-12-25 DIAGNOSIS — F17210 Nicotine dependence, cigarettes, uncomplicated: Secondary | ICD-10-CM | POA: Insufficient documentation

## 2019-12-25 DIAGNOSIS — Z952 Presence of prosthetic heart valve: Secondary | ICD-10-CM | POA: Insufficient documentation

## 2019-12-25 DIAGNOSIS — I2510 Atherosclerotic heart disease of native coronary artery without angina pectoris: Secondary | ICD-10-CM | POA: Insufficient documentation

## 2019-12-25 DIAGNOSIS — Z955 Presence of coronary angioplasty implant and graft: Secondary | ICD-10-CM | POA: Diagnosis not present

## 2019-12-25 DIAGNOSIS — I5032 Chronic diastolic (congestive) heart failure: Secondary | ICD-10-CM | POA: Insufficient documentation

## 2019-12-25 DIAGNOSIS — N184 Chronic kidney disease, stage 4 (severe): Secondary | ICD-10-CM | POA: Diagnosis not present

## 2019-12-25 DIAGNOSIS — I712 Thoracic aortic aneurysm, without rupture: Secondary | ICD-10-CM | POA: Insufficient documentation

## 2019-12-25 DIAGNOSIS — I10 Essential (primary) hypertension: Secondary | ICD-10-CM

## 2019-12-25 MED ORDER — EMPAGLIFLOZIN 10 MG PO TABS: 10.0000 mg | ORAL_TABLET | Freq: Every day | ORAL | 11 refills | Status: DC

## 2019-12-25 MED ORDER — HYDRALAZINE HCL 50 MG PO TABS
50.0000 mg | ORAL_TABLET | Freq: Three times a day (TID) | ORAL | 3 refills | Status: DC
Start: 2019-12-25 — End: 2020-02-29

## 2019-12-25 NOTE — Progress Notes (Signed)
PCP:  Kerin Perna, NP          Cardiologist:  Dr. Aundra Dubin HPI:    History of Present Illness: [name] is a 49 y.o. male who has a history of CAD, NSTEMI 11/2017, smoking, CKD Stage III, HTN, AAA repair 2016at [facility] , open repair of TAAA secondary to chronic dissection with multibranch dacrongraft 12/2016, CVA 2017, loop recorder, bioprothetic AVR 2016, HLD.   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 The descending thoracic aorta has dilated to 5.1 cm. Cardiac catheterization in April 2016 demonstrated 60% first diagonal lesion. He underwent aortic valve replacement with bovine pericardial tissue valve and reconstruction along with aortic root replacement and endovascular repair of the descending thoracic aortic aneurysm at [facility]. He has had intermittent chest discomfort since then.  Admitted in 12/02/2017 chest pain. Had NSTEMI. He had LHC with stent placed to proximal ramus.  Repeat cath in 7/20 with nonobstructive disease.   He was admitted in 11/24/19 with atypical chest pain.  Echo showed EF 60-65% with severe LVH, normal RV, bioprosthetic aortic valve with mean gradient 18 mmHg. Cardiolite showed no ischemia.   He presented to clinic with Dr. Aundra Dubin 11/28/19 for followup of CAD, HTN.  He was down to about 2 cigarettes/day at time of the visit.  Main complaint was chest pain. This has been present now for years.  It usually presents at rest, often when he is lying in bed. Tuming sometimes helps. Sometimes related to meals but not always. He also reported getting chest pain with exertion/arm movement. He fatigues easily with heavier activity like push-mowing his grass. No significant exertional dyspnea. BP high at time of appointment, 145/90.  Today he returns to HF clinic for pharmacist medication titration. At last visit with MD,  hydralazine was increased to 50 mg TID. He also is seen in Hypertension Clinic. At visit on 12/07/19, the pharmacist increased his doxazosin to 16 mg daily. Since that visit, patient reported only taking hydralazine BID because he feels sluggish and SBP in 120s after second dose of the day.  Overall, patient is feeling pretty good. No dizziness, lightheadedness or fatigue. Patient notices aching in chest when he turns, which has been present since surgery in 2016. Patient reports occasional SOB when running after grandchildren. Weight at home is typically 165 lbs. Not taking any diuretic. No LEE, PND, orthopnea.  Sleep study performed 12/21/19, awaiting results. He follows a low salt diet, uses Mrs. Dash. BP in clinic elevated 184/111, 178/96 on repeat. He reported only having taken his medications less than an hour ago. States his SBPs at home range from 130-180. I encouraged him to start taking his medications at the same time each day and to check his BP at the same time each day to better understand his BP fluctuations.    HF/HTN Medications: Carvedilol 25 mg BID  Hydralazine 50 mg TID - reported only taking BID Amlodipine 10 mg daily  Doxazosin 16 mg QHS  Clonidine 0.3 mg/24 hrs, 1 patch weekly Minoxidil 10 mg BID  Has the patient been experiencing any side effects to the medications prescribed?  Yes - reported some sluggishness when taking hydralazine. Has had adverse effects with doses over 50 mg TID in the past.   Does the patient have any problems obtaining medications due to transportation or finances?   NO -  has [employer] of regimen: Good Understanding of indications: Good Potential of compliance: Fair Patient understands to avoid NSAIDs. Patient understands to avoid decongestants.    Pertinent Lab Values 11/26/19:  Serum creatinine 3.31, BUN 24, Potassium 4.1, Sodium 139   Vital Signs:  Weight: 155.2 lbs (last clinic weight: 160 lb)  Blood  pressure: 184/110, 178/96 on repeat.   Heart rate: 78 bpm  Assessment/Plan: 1. CAD: 9/19 NSTEMI with PCI to proximal ramus.  LHC in 7/20 with no obstructive disease.  Recent admission 11/24/19 with Cardiolite showing no ischemia.  He has long-standing exertional and non-exertional chest pain, suspected to be musculoskeletal and related to his prior surgeries.  Cannot rule out GERD as a contributor (occurs when lying in bed, some relief with TUMS).  - Continue pantoprazole 40 mg daily.  - Continue ASA 81.  - Continue Repatha via the lipid clinic.  2. Chronic systolic => diastolic CHF: Echo in 0/81 with EF35-40% with severe LVH and concern for infiltrative process.Myeloma panel negative and PYP scan negative, suspect due to HTN. He is not volume overloaded on exam and is not using furosemide. Cannot get cardiac MRI with elevated creatinine.  Most recent echo in 9/21 showed EF up to 60-65%, severe LVH, normal RV. - Not taking any diuretic.   - Continue carvedilol 25 mg BID. - Initiate empagliflozin 10 mg daily for benefits in HF, CKD and CAD. Recheck BMET in 2 weeks.  - He is off spironolactone and Entresto with CKD stage IV.  3. HTN: Still needs better control. BP in clinic 184/111,  178/96 on repeat. Patient had just taken his medications prior to clinic so no extra medication was administered. Encouraged adherence.   - Continue amlodipine 10 mg daily, carvedilol 25 mg BID, minoxidil 10 mg daily, doxazosin 16 mg nightly and clonidine 0.3 mg/24 hrs.  - Continue hydralazine 50 mg TID -- counseled on adherence to TID regimen, now only taking BID.  - Follows with Hypertension Clinic at [facility] - will see them again next week  - Suspect OSA, sleep study performed 12/21/19 4. S/P Bioprothetic AVR: Echo in 9/21 with mildly elevated mean gradient (18 mmHg).  5. 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 [name] at [facility].  6. CKD Stage IIIb-IV: Creatinine most recently up to 3.31. -  Start empagliflozin 10 mg daily for benefits in HF, CKD and CAD as above. Repeat BMET in 2 weeks.    7.Active smoker: He is cutting back   Plan: 1) Medication changes: Based on clinical presentation, vital signs and recent labs will initiate empagliflozin 10 mg daily and start taking hydralazine 50 mg TID (previously only taking BID).  2) Follow-up with Dr. Aundra Dubin in 2 months   Audry Riles, PharmD, BCPS, [facility], CPP Heart Failure Clinic Pharmacist [phone]

## 2019-12-25 NOTE — Patient Instructions (Signed)
It was a pleasure seeing you today!  MEDICATIONS: -We are changing your medications today -Start Jardiance (empagliflozin) 10 mg (1 tablet) daily -Call if you have questions about your medications.   NEXT APPOINTMENT: Return to clinic in 2 months with Dr Aundra Dubin.  In general, to take care of your heart failure: -Limit your fluid intake to 2 Liters (half-gallon) per day.   -Limit your salt intake to ideally 2-3 grams (2000-3000 mg) per day. -Weigh yourself daily and record, and bring that "weight diary" to your next appointment.  (Weight gain of 2-3 pounds in 1 day typically means fluid weight.) -The medications for your heart are to help your heart and help you live longer.   -Please contact us before stopping any of your heart medications.  Call the clinic at [phone] with questions or to reschedule future appointments.

## 2019-12-31 NOTE — Procedures (Signed)
   Patient Name: [name], [name] Date:12/18/2019 Gender: Male D.O.B: 1970/08/06 Age (years): 49 Referring Provider: Skeet Latch Height (inches): 63 Interpreting Physician: Fransico Him MD, ABSM Weight (lbs): 164 RPSGT: Jorge Ny BMI: 23 MRN: [id number] Neck Size: 15.00  CLINICAL INFORMATION Sleep Study Type: NPSG  Indication for sleep study: COPD, Fatigue, Hypertension, Morning Headaches  Epworth Sleepiness Score: 5  Most recent polysomnogram dated 06/02/2017 revealed an AHI of 1.4/h.  SLEEP STUDY TECHNIQUE As per the AASM Manual for the Scoring of Sleep and Associated Events v2.3 (April 2016) with a hypopnea requiring 4% desaturations.  The channels recorded and monitored were frontal, central and occipital EEG, electrooculogram (EOG), submentalis EMG (chin), nasal and oral airflow, thoracic and abdominal wall motion, anterior tibialis EMG, snore microphone, electrocardiogram, and pulse oximetry.  MEDICATIONS Medications self-administered by patient taken the night of the study : DOXAZOSIN MESYLATE, AMITRIPTYLINE  SLEEP ARCHITECTURE The study was initiated at 10:40:11 PM and ended at 5:21:29 AM.  Sleep onset time was 11.2 minutes and the sleep efficiency was 73.5%. The total sleep time was 295 minutes.  Stage REM latency was N/A minutes.  The patient spent 18.6% of the night in stage N1 sleep, 81.4% in stage N2 sleep, 0.0% in stage N3 and 0% in REM.  Alpha intrusion was absent.  Supine sleep was 34.58%.  RESPIRATORY PARAMETERS The overall apnea/hypopnea index (AHI) was 0.0 per hour. There were 0 total apneas, including 0 obstructive, 0 central and 0 mixed apneas. There were 0 hypopneas and 20 RERAs.  The AHI during Stage REM sleep was N/A per hour.  AHI while supine was 0.0 per hour.  The mean oxygen saturation was 97.3%. The minimum SpO2 during sleep was 94.0%.  moderate snoring was noted during this study.  CARDIAC DATA The 2 lead EKG  demonstrated sinus rhythm. The mean heart rate was 60.4 beats per minute. Other EKG findings include: Intermittent LBBB, PVCs, PACs and nonsustained ventricular tachycardia.  LEG MOVEMENT DATA The total PLMS were 0 with a resulting PLMS index of 0.0. Associated arousal with leg movement index was 0.0 .  IMPRESSIONS - No significant obstructive sleep apnea occurred during this study (AHI = 0.0/h). - No significant central sleep apnea occurred during this study (CAI = 0.0/h). - The patient had minimal or no oxygen desaturation during the study (Min O2 = 94.0%) - The patient snored with moderate snoring volume. - EKG findings include Intermittent LBBB, PVCs, PACs and nonsustained ventricular tachycardia. - Clinically significant periodic limb movements did not occur during sleep. No significant associated arousals.  DIAGNOSIS - Normal Study - Intermittent LBBB - PACs, PVCs and Nonsustained ventricular tachycardia  RECOMMENDATIONS - Avoid alcohol, sedatives and other CNS depressants that may worsen sleep apnea and disrupt normal sleep architecture. - Sleep hygiene should be reviewed to assess factors that may improve sleep quality. - Weight management and regular exercise should be initiated or continued if appropriate.  [Electronically signed] 12/31/2019 09:58 AM  Fransico Him MD, ABSM Diplomate, American Board of Sleep Medicine

## 2020-01-03 ENCOUNTER — Telehealth: Payer: Self-pay

## 2020-01-03 NOTE — Telephone Encounter (Signed)
-----   Message from Sueanne Margarita, MD sent at 12/31/2019 10:00 AM EDT ----- Please let patient know that sleep study showed no significant sleep apnea.

## 2020-01-03 NOTE — Telephone Encounter (Signed)
Per DPR left detailed message on voicemail explaining negative results Instructed patient to call back if he would like to go over results in detail or has any questions

## 2020-01-04 ENCOUNTER — Ambulatory Visit: Payer: Medicare HMO

## 2020-01-04 ENCOUNTER — Other Ambulatory Visit: Payer: Self-pay | Admitting: Adult Health

## 2020-01-04 NOTE — Progress Notes (Deleted)
HPI:  [name] is a 49 y.o. male patient of Dr Oval Linsey, with a PMH below who presents today for advanced hypertension clinic follow up.  He was seen by Dr. Oval Linsey in mid-August with a blood pressure of 182/100.  There was concern at that time about compliance with medications, and he was given instructions to simplify his medication regimen to three times daily.  After that visit he was admitted to [facility] for an episode of chest pain with uncontrolled hypertension.  At a follow up with Dr. Aundra Dubin his hydralazine was increased from 25 mg tid to 50 mg tid, and a sleep study was ordered.    He returns today for follow up.  He has been working on some lifestyle modifications, including trying to decrease his caffeine intake and stop smoking.  Apparently waiting for insurance to send nicotine replacement patches.  Occasionally will miss mid-day dose of hydralazine, but states takes all doses most days.  States still has problems after cracked ribs during aortic dissection surgery, this keeps him from being as active as he would like with his grandchildren.    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 DKarlene Einsteinveggies, fresh;   Exercise: no formal exercise, just with grandkids,  Continues to have discomfort from cracked ribs (post surgery)   Home BP readings:  Using apple watch, not accurate; has home cuff but no readings with him today  Intolerances: isosorbide and  higher doses of hydralazine - headaches  Labs:  9/21:  Na 139, K 4.1, Glu 97, BUN 24, Scr 3.31, GFR 21  Wt Readings from Last 3 Encounters:  12/25/19 155 lb 3.2 oz (70.4 kg)  12/18/19 164 lb (74.4 kg)  12/07/19 161 lb 9.6 oz (73.3 kg)   BP Readings from Last 3 Encounters:  12/25/19 (!) 184/110  12/07/19 (!) 162/96  11/28/19 (!) 145/90   Pulse Readings from Last 3 Encounters:  12/25/19 78  12/07/19 75  11/28/19 99    Current Outpatient Medications  Medication Sig Dispense Refill  . acetaminophen (TYLENOL) 325 MG tablet Take 650 mg by mouth every 6 (six) hours as needed (for pain or headaches).     .Marland Kitchenalbuterol (VENTOLIN HFA) 108 (90 Base) MCG/ACT inhaler Inhale into the lungs every 6 (six) hours as needed for wheezing or shortness of breath.    .Marland Kitchenamitriptyline (ELAVIL) 50 MG tablet Take 50 mg by mouth at bedtime.    .Marland KitchenamLODipine (NORVASC) 10 MG tablet Take 10 mg by mouth daily.    .Marland Kitchenaspirin EC 81 MG EC tablet Take 1 tablet (  81 mg total) by mouth daily. 90 tablet 3  . Blood Pressure Monitor KIT 1 Device by Does not apply route 2 (two) times daily. 1 kit 0  . carvedilol (COREG) 25 MG tablet Take 25 mg by mouth 2 (two) times daily with a meal.    . cloNIDine (CATAPRES - DOSED IN MG/24 HR) 0.3 mg/24hr patch Place 1 patch (0.3 mg total) onto the skin once a week. 4 patch 6  . doxazosin (CARDURA) 8 MG tablet Take 2 tablets (16 mg total) by mouth at bedtime. 180 tablet 1  . empagliflozin (JARDIANCE) 10 MG TABS tablet Take 1 tablet (10 mg total) by mouth daily. 30 tablet 11  . Evolocumab (REPATHA SURECLICK) 354 MG/ML SOAJ Inject 1 Dose into the skin every 14 (fourteen) days. 2 mL 11  . hydrALAZINE  (APRESOLINE) 50 MG tablet Take 1 tablet (50 mg total) by mouth 3 (three) times daily. 270 tablet 3  . minoxidil (LONITEN) 10 MG tablet Take 10 mg by mouth 2 (two) times daily.    . pantoprazole (PROTONIX) 40 MG tablet Take 1 tablet (40 mg total) by mouth daily. 30 tablet 6  . STIOLTO RESPIMAT 2.5-2.5 MCG/ACT AERS INHALE 2 PUFFS INTO THE LUNGS DAILY 4 g 5   No current facility-administered medications for this visit.    Allergies  Allergen Reactions  . Imdur [Isosorbide Nitrate] Other (See Comments)    Headaches   . Tramadol Other (See Comments)    Headaches     Past Medical History:  Diagnosis Date  . Adenomatous colon polyp 08/2015  . Anxiety   . Aortic disease ([place])   . Aortic dissection (HCC)    a. admx 04/2014 >> L renal infarct; a/c renal failure >> b.  s/p Bioprosthetic [place] and total arch replacement and staged endovascular repair of descending aortic aneurysm ([facility] - Dr. [name])  . CAD (coronary artery disease)    a. LHC 4/16:  oD1 60%  . Cardiomyopathy ([place])    a. non-ischemic - probably related to untreated HTN and ETOH abuse - Echo 3/13 with EF 35-40% >> b. Echo 4/16: Severe LVH, EF 55-60%, moderate AI, moderate MR, mild LAE, trivial effusion, known type B dissection with communication between true and false lumens with suprasternal images suggesting dissection plane may propagate to at least left subclavian takeoff, root above aortic valve okay    . Chronic abdominal pain   . Chronic combined systolic and diastolic congestive heart failure ([place])    a. 05/2011: Adm with pulm edema/HTN urgency, EF 35-40% with diffuse hypokinesis and moderate to severe mitral regurgitation. Cardiomyopathy likely due to uncontrolled HTN and ETOH abuse - cath deferred due to renal insufficiency (felt due to uncontrolled HTN). bJodie Echevaria MV 06/2011: EF 37% and no ischemia or infarction. c. EF 45-50% by echo 01/2012.  [name] Chronic sinusitis   . CKD (chronic kidney disease)    a. Suspected HTN  nephropathy.;  b.  peak creatinine 3.46 during admx for aortic dissection 2/16.  sees Dr [name]  . DDD (degenerative disc disease), lumbar   . Depression   . Descending thoracic aortic aneurysm ([place])   . Dissecting aneurysm of thoracic aorta ([place])   . ETOH abuse    a. Reported to have quit 05/2011.  . Frequent headaches   . GERD (gastroesophageal reflux disease)   . Headache(784.0)    "q other day" (08/08/2013)  . Heart murmur   . Hemorrhoid thrombosis   . History of echocardiogram  Echo 1/17:  Severe LVH, EF 55-60%, no RWMA, Gr 2 DD, AVR ok, mild to mod MR, mild LAE, mild reduced RVSF, mod RAE  . History of medication noncompliance   . HYPERLIPIDEMIA   . Hypertension    a. Hx of HTN urgency secondary to noncompliance. b. urinary metanephrine and catecholeamine levels normal 2013.  c. Renal art [name] 1/16:  No evidence of renal artery stenosis noted bilaterally.  . INGUINAL HERNIA   . Pneumonia ~ 2013  . Renal insufficiency   . Serrated adenoma of colon 08/2015  . Stroke ([place])   . Tobacco abuse   . Valvular heart disease    a. Echo 05/2011: moderate to severe eccentric MR and mild to moderate AI with prolapsing left coronary cusp. b. Echo 01/2012: mild-mod AI, mild dilitation of aortic root, mild MR.;  c. Echo 1/16: Severe LVH consistent with hypertrophic cardio myopathy, EF 50%, no RWMA, mod AI, mild MR, mild RAE, dilated Ao root (40 mm);     There were no vitals taken for this visit.  No problem-specific Assessment & Plan notes found for this encounter.   Charmayne Odell Rodriguez-Guzman PharmD, BCPS, [place] [address] 01/04/2020 8:34 AM

## 2020-01-08 ENCOUNTER — Other Ambulatory Visit (HOSPITAL_COMMUNITY): Payer: Medicare HMO

## 2020-01-10 ENCOUNTER — Ambulatory Visit (HOSPITAL_COMMUNITY)
Admission: RE | Admit: 2020-01-10 | Discharge: 2020-01-10 | Disposition: A | Discharge status: Home / Self Care | Payer: Medicare HMO | Source: Ambulatory Visit | Attending: Cardiology | Admitting: Cardiology

## 2020-01-10 ENCOUNTER — Other Ambulatory Visit: Payer: Self-pay

## 2020-01-10 DIAGNOSIS — I429 Cardiomyopathy, unspecified: Secondary | ICD-10-CM | POA: Diagnosis not present

## 2020-01-10 LAB — BASIC METABOLIC PANEL
Anion gap: 11 (ref 5–15)
BUN: 25 mg/dL — ABNORMAL HIGH (ref 6–20)
CO2: 19 mmol/L — ABNORMAL LOW (ref 22–32)
Calcium: 9.4 mg/dL (ref 8.9–10.3)
Chloride: 109 mmol/L (ref 98–111)
Creatinine, Ser: 2.96 mg/dL — ABNORMAL HIGH (ref 0.61–1.24)
GFR, Estimated: 25 mL/min — ABNORMAL LOW (ref >60)
Glucose, Bld: 91 mg/dL (ref 70–99)
Potassium: 4 mmol/L (ref 3.5–5.1)
Sodium: 139 mmol/L (ref 135–145)

## 2020-01-11 ENCOUNTER — Ambulatory Visit (INDEPENDENT_AMBULATORY_CARE_PROVIDER_SITE_OTHER): Payer: Medicare HMO | Admitting: Pharmacist

## 2020-01-11 ENCOUNTER — Other Ambulatory Visit: Payer: Self-pay | Admitting: Cardiovascular Disease

## 2020-01-11 VITALS — BP 158/96 | HR 93 | Resp 14 | Ht 71.0 in | Wt 158.0 lb

## 2020-01-11 DIAGNOSIS — I10 Essential (primary) hypertension: Secondary | ICD-10-CM | POA: Diagnosis not present

## 2020-01-11 MED ORDER — MINOXIDIL 10 MG PO TABS: 20.0000 mg | ORAL_TABLET | Freq: Two times a day (BID) | ORAL | 1 refills | Status: DC

## 2020-01-11 NOTE — Patient Instructions (Addendum)
It was good meeting you today!  We want you to continue your carvedilol 25 mg twice a day, clonidine 0.3mg  once a week, amlodipine 10mg , doxasozin 8mg  (2 tablets) once a day, hydralazine 50 mg twice a day (three times a day if you can) and start taking your Jardiance in the morning.  Start taking 2 minoxidil 10mg  tablets twice a day and continue to check your blood pressure  Give [name] a call with any questions  Karren Cobble, PharmD, [place], [place] [address], [place], [place] [address] Phone: [phone]; Fax: [phone] 01/11/2020 2:22 PM

## 2020-01-11 NOTE — Progress Notes (Signed)
Patient ID: [name]                 DOB: [birth year]                      MRN: [id number]     HPI: [name] is a 49 y.o. male referred by Dr. Oval Linsey to Advanced HTN clinic. PMH is significant for CHF, malignant hypertention, aortic dissection, hx of CVA, hx of stroke, hx of NSTEMI, and CKD.  Blood pressure has been consistently elevated.  This is is his second visit to advanced hypertension clinic.  Patient reports he checks his BP at home but did not bring log or machine.  Reports the lowest it was been is systolic of 409.  Reports compliance with medications however regimen is complicated by CKD.  Has reduced tobacco to 4-5 cigarettes a day and now has nicotine patches and gum.  Uses gum 2x a day but has not started patches yet. Says it has been helping.  Denies alcohol.  Reports he has very little appetite, however also reports eating chips, cookies, and hot dogs.    Current HTN meds: amlodipine 70m daily, carvedilol 286mBID, clonidine 0.44m5meek, doxazosin 86m45mily, hydralazine 50 mg BID, minoxidil 10 mg twice a day Previously tried: hydralazine 10 mg TID (headaches) BP goal: <130/80  Social History: 4 cigarettes a day  Home BP readings: Unknown  Wt Readings from Last 3 Encounters:  12/25/19 155 lb 3.2 oz (70.4 kg)  12/18/19 164 lb (74.4 kg)  12/07/19 161 lb 9.6 oz (73.3 kg)   BP Readings from Last 3 Encounters:  12/25/19 (!) 184/110  12/07/19 (!) 162/96  11/28/19 (!) 145/90   Pulse Readings from Last 3 Encounters:  12/25/19 78  12/07/19 75  11/28/19 99    Renal function: CrCl cannot be calculated (Unknown ideal weight.).  Past Medical History:  Diagnosis Date  . Adenomatous colon polyp 08/2015  . Anxiety   . Aortic disease (HCC)[place]. Aortic dissection (HCC)    a. admx 04/2014 >> L renal infarct; a/c renal failure >> b.  s/p Bioprosthetic Bentall and total arch replacement and staged endovascular repair of descending aortic aneurysm ([facility] - Dr. HughYsidro Evert. CAD (coronary artery disease)    a. LHC 4/16:  oD1 60%  . Cardiomyopathy (HCC)[place] a. non-ischemic - probably related to untreated HTN and ETOH abuse - Echo 3/13 with EF 35-40% >> b. Echo 4/16: Severe LVH, EF 55-60%, moderate AI, moderate MR, mild LAE, trivial effusion, known type B dissection with communication between true and false lumens with suprasternal images suggesting dissection plane may propagate to at least left subclavian takeoff, root above aortic valve okay    . Chronic abdominal pain   . Chronic combined systolic and diastolic congestive heart failure (HCC)[place] a. 05/2011: Adm with pulm edema/HTN urgency, EF 35-40% with diffuse hypokinesis and moderate to severe mitral regurgitation. Cardiomyopathy likely due to uncontrolled HTN and ETOH abuse - cath deferred due to renal insufficiency (felt due to uncontrolled HTN). b. LeJodie Echevaria4/2013: EF 37% and no ischemia or infarction. c. EF 45-50% by echo 01/2012.  . ChMarland Kitchenonic sinusitis   . CKD (chronic kidney disease)    a. 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  peak creatinine 3.46 during admx for aortic dissection 2/16.  sees Dr PoweFlorene GlenDDD (degenerative disc disease), lumbar   . Depression   . Descending thoracic aortic  aneurysm ([place])   . Dissecting aneurysm of thoracic aorta ([place])   . ETOH abuse    a. Reported to have quit 05/2011.  . Frequent headaches   . GERD (gastroesophageal reflux disease)   . Headache(784.0)    "q other day" (08/08/2013)  . Heart murmur   . Hemorrhoid thrombosis   . History of echocardiogram    Echo 1/17:  Severe LVH, EF 55-60%, no RWMA, Gr 2 DD, AVR ok, mild to mod MR, mild LAE, mild reduced RVSF, mod RAE  . History of medication noncompliance   . HYPERLIPIDEMIA   . Hypertension    a. Hx of HTN urgency secondary to noncompliance. b. urinary metanephrine and catecholeamine levels normal 2013.  c. Renal art [name] 1/16:  No evidence of renal artery stenosis noted bilaterally.  . INGUINAL HERNIA   . Pneumonia ~  2013  . Renal insufficiency   . Serrated adenoma of colon 08/2015  . Stroke ([place])   . Tobacco abuse   . Valvular heart disease    a. Echo 05/2011: moderate to severe eccentric MR and mild to moderate AI with prolapsing left coronary cusp. b. Echo 01/2012: mild-mod AI, mild dilitation of aortic root, mild MR.;  c. Echo 1/16: Severe LVH consistent with hypertrophic cardio myopathy, EF 50%, no RWMA, mod AI, mild MR, mild RAE, dilated Ao root (40 mm);     Current Outpatient Medications on File Prior to Visit  Medication Sig Dispense Refill  . acetaminophen (TYLENOL) 325 MG tablet Take 650 mg by mouth every 6 (six) hours as needed (for pain or headaches).     [name] albuterol (VENTOLIN HFA) 108 (90 Base) MCG/ACT inhaler Inhale into the lungs every 6 (six) hours as needed for wheezing or shortness of breath.    [name] amitriptyline (ELAVIL) 50 MG tablet Take 50 mg by mouth at bedtime.    [name] amLODipine (NORVASC) 10 MG tablet Take 10 mg by mouth daily.    [name] aspirin EC 81 MG EC tablet Take 1 tablet (81 mg total) by mouth daily. 90 tablet 3  . Blood Pressure Monitor KIT 1 Device by Does not apply route 2 (two) times daily. 1 kit 0  . carvedilol (COREG) 25 MG tablet Take 25 mg by mouth 2 (two) times daily with a meal.    . cloNIDine (CATAPRES - DOSED IN MG/24 HR) 0.3 mg/24hr patch Place 1 patch (0.3 mg total) onto the skin once a week. 4 patch 6  . doxazosin (CARDURA) 8 MG tablet Take 2 tablets (16 mg total) by mouth at bedtime. 180 tablet 1  . empagliflozin (JARDIANCE) 10 MG TABS tablet Take 1 tablet (10 mg total) by mouth daily. 30 tablet 11  . Evolocumab (REPATHA SURECLICK) 923 MG/ML SOAJ Inject 1 Dose into the skin every 14 (fourteen) days. 2 mL 11  . hydrALAZINE (APRESOLINE) 50 MG tablet Take 1 tablet (50 mg total) by mouth 3 (three) times daily. 270 tablet 3  . minoxidil (LONITEN) 10 MG tablet Take 10 mg by mouth 2 (two) times daily.    . pantoprazole (PROTONIX) 40 MG tablet Take 1 tablet (40 mg total) by  mouth daily. 30 tablet 6  . STIOLTO RESPIMAT 2.5-2.5 MCG/ACT AERS INHALE 2 PUFFS INTO THE LUNGS DAILY 4 g 5   No current facility-administered medications on file prior to visit.    Allergies  Allergen Reactions  . Imdur [Isosorbide Nitrate] Other (See Comments)    Headaches   . Tramadol Other (See Comments)  Headaches      Assessment/Plan:  1. Hypertension - BP in room today 162/96 which is above goal of <130/80. Likely due to tobacco abuse, diet, and questionable compliance with medication regimen.  Recommended patient avoid processed foods such as hot dogs and chips due to sodium content.  Recommended patient begin using his nicotine gum more frequently, up to every 1-2 hours as needed.  Due to CKD, unable to add ACE/ARB/spiro and patient at max dose of other hypertension medications (other than hydralazine which he reports gives him headaches at higher doses).  Recommended he try to take hydralazine three times a day if tolerated.  Will increase minoxidil at this time to 82m BID and recheck in Adv HTN clinic in 1 month.  Patient voiced understanding.  CKarren Cobble PharmD, BCACP, [place] [place] [address]Phone: [phone] Fax: [phone]/28/2021 4:38 PM

## 2020-01-12 ENCOUNTER — Other Ambulatory Visit: Payer: Self-pay | Admitting: Cardiovascular Disease

## 2020-01-12 ENCOUNTER — Telehealth: Payer: Self-pay | Admitting: Gastroenterology

## 2020-01-12 DIAGNOSIS — I10 Essential (primary) hypertension: Secondary | ICD-10-CM

## 2020-01-12 NOTE — Telephone Encounter (Signed)
Patient needs an office visit please with Dr. Fuller Plan prior to scheduling

## 2020-01-12 NOTE — Telephone Encounter (Signed)
Patient called to schedule colonoscopy at the hospital has recall stating Colon at [facility], low EF and hx of polyps

## 2020-01-15 IMAGING — DX DG CHEST 2V
2 series · 2 of 2 positions shown · non-contrast
Comparison: CT chest 04/08/2017

CLINICAL DATA: Left-sided chest pain. Intermittent pain since last
night.

EXAM:
CHEST - 2 VIEW

[w chest pa]
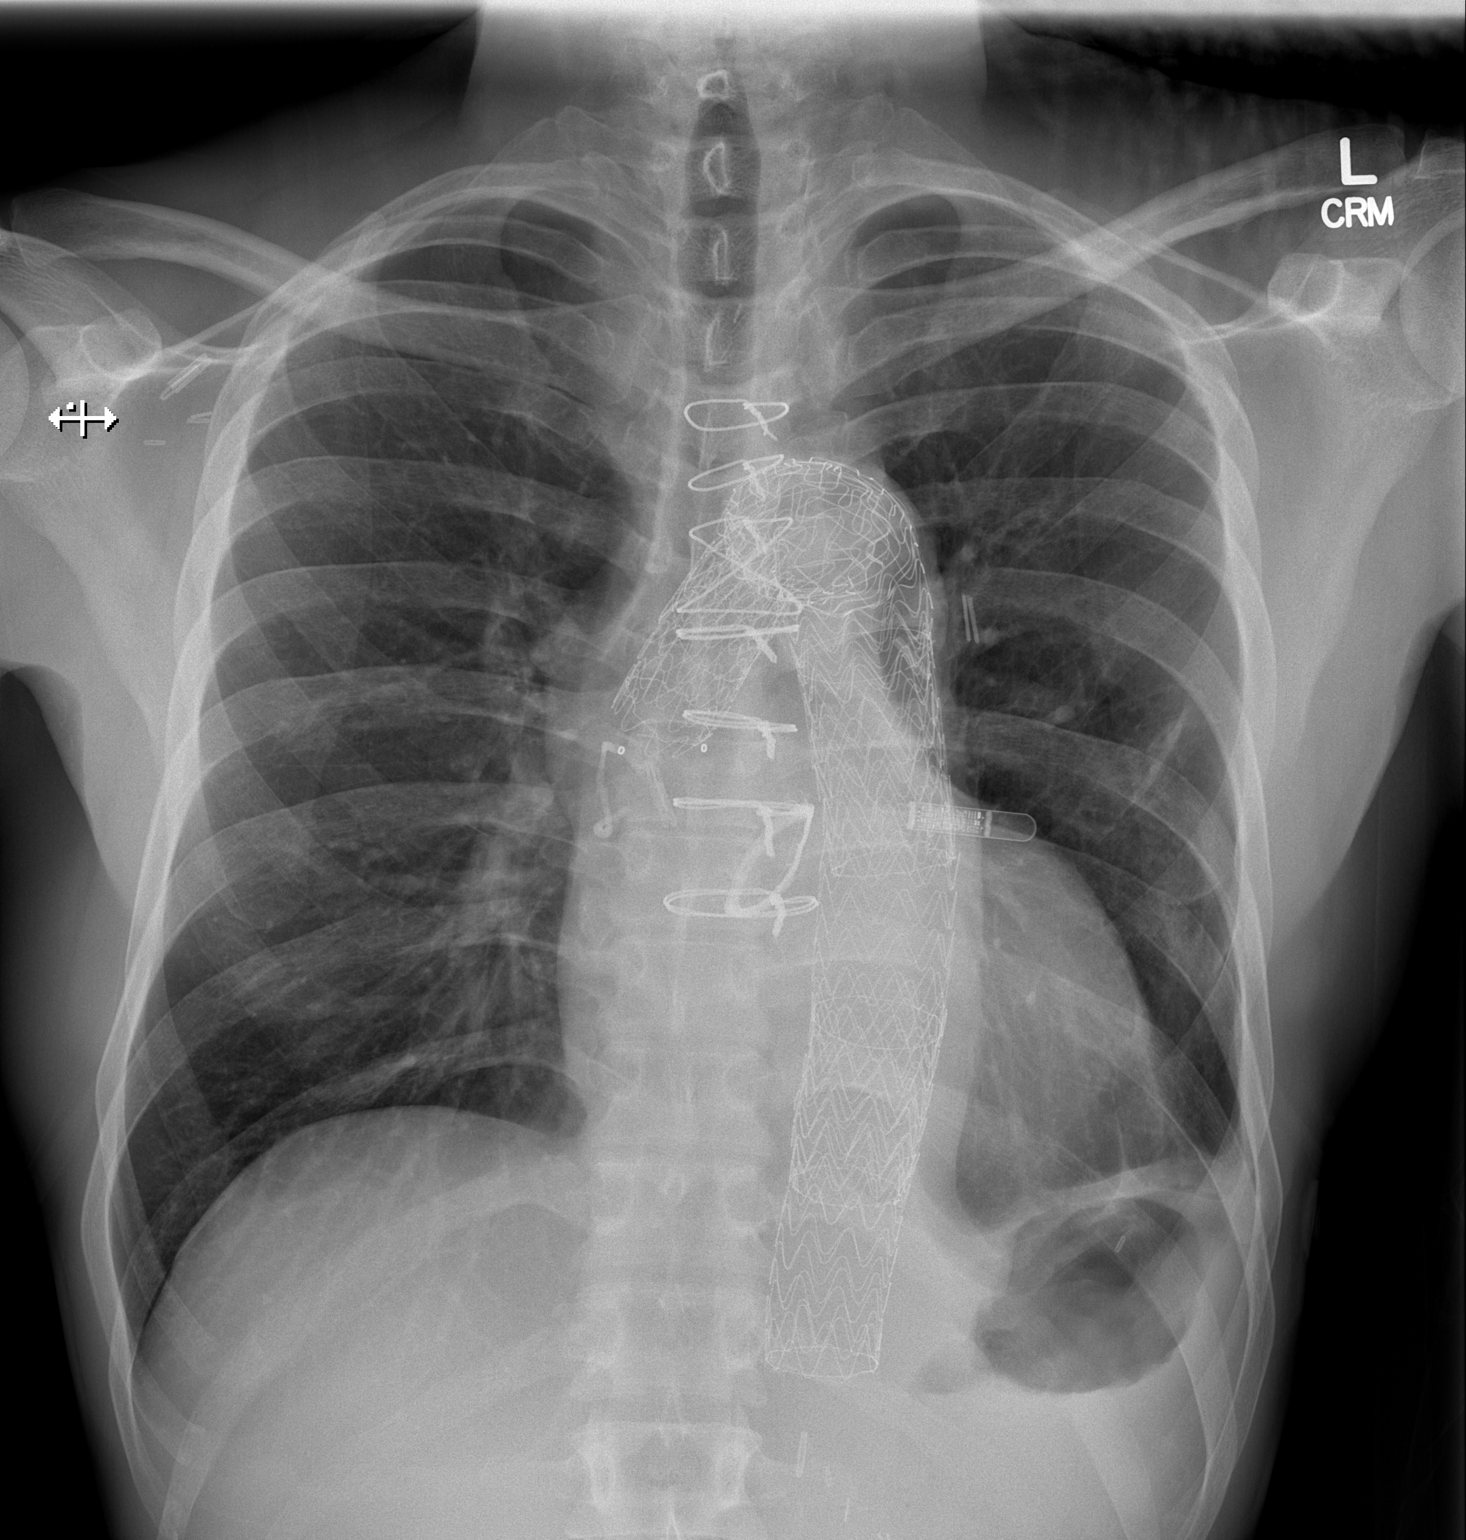

[w chest lat]
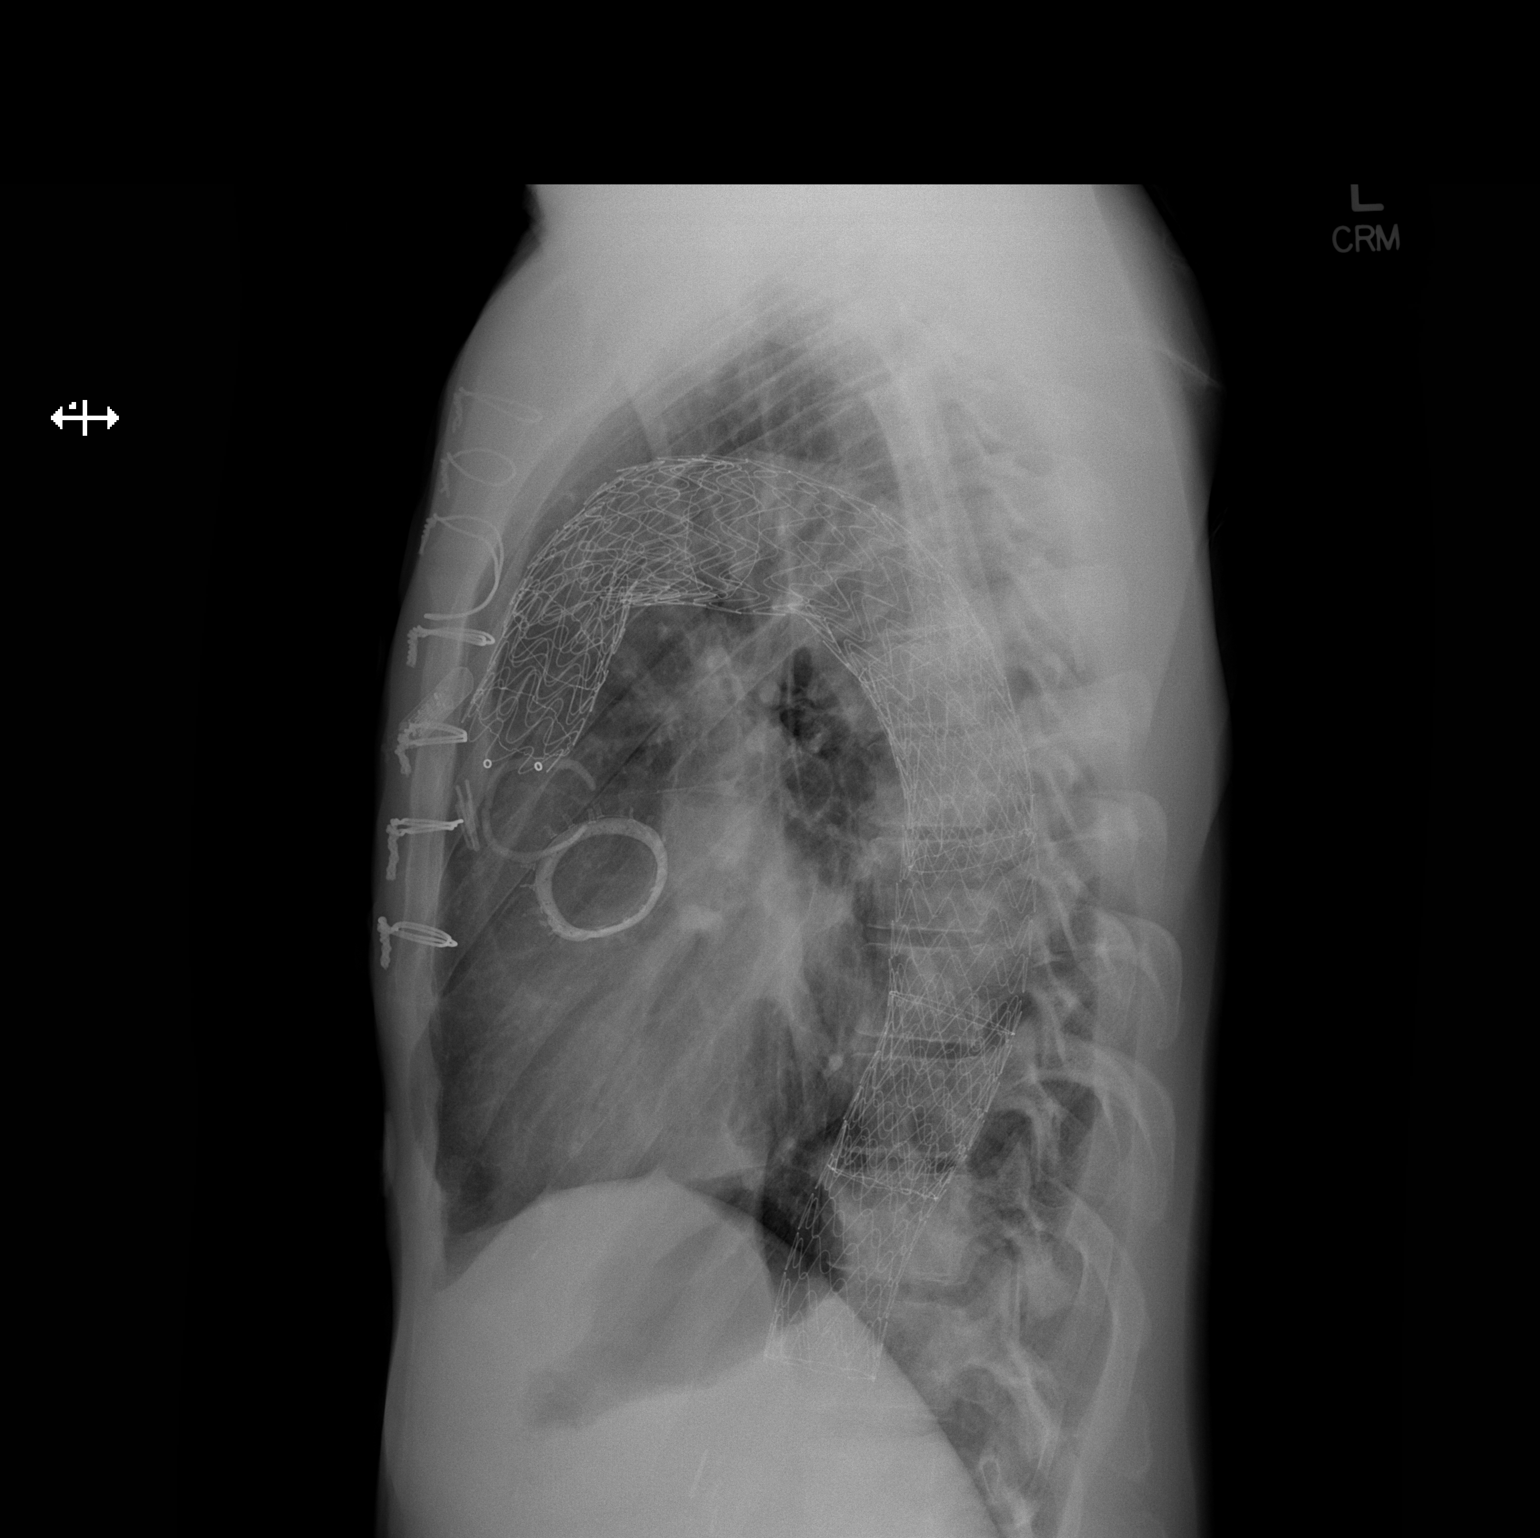

[2 of 2 positions shown; findings below may reference images not displayed]

FINDINGS: There is blunting of the left costophrenic angle likely reflecting
scarring. There is no focal consolidation. There is no pleural
effusion or pneumothorax. The heart and mediastinal contours are
unremarkable. There is a thoracic aortic stent graft present. There
is an aortic valve replacement. There is evidence of prior median
sternotomy. There is a implantable loop recorder in the anterior
chest wall.

The osseous structures are unremarkable.
IMPRESSION: No active cardiopulmonary disease.

## 2020-01-23 ENCOUNTER — Telehealth: Payer: Self-pay | Admitting: Emergency Medicine

## 2020-01-23 NOTE — Telephone Encounter (Signed)
Patient notified that Loop recoreder has reached RRT. Patient would like to discuss extraction and reimplant with DR Caryl Comes. Patient is open to Video or in person visit to discuss options.Instruicted to unplug remote monitor at this time.

## 2020-01-26 ENCOUNTER — Ambulatory Visit (INDEPENDENT_AMBULATORY_CARE_PROVIDER_SITE_OTHER): Payer: Medicare HMO

## 2020-01-26 DIAGNOSIS — I63412 Cerebral infarction due to embolism of left middle cerebral artery: Secondary | ICD-10-CM | POA: Diagnosis not present

## 2020-01-27 LAB — CUP PACEART REMOTE DEVICE CHECK
Date Time Interrogation Session: 20211112185236
Implantable Pulse Generator Implant Date: 20180406

## 2020-01-29 NOTE — Progress Notes (Signed)
[facility] Summary Report / Loop Recorder 

## 2020-02-03 ENCOUNTER — Other Ambulatory Visit (HOSPITAL_COMMUNITY): Payer: Self-pay | Admitting: Adult Health

## 2020-02-06 ENCOUNTER — Encounter: Payer: Self-pay | Admitting: Gastroenterology

## 2020-02-06 ENCOUNTER — Encounter: Payer: Medicare HMO | Admitting: Internal Medicine

## 2020-02-06 ENCOUNTER — Ambulatory Visit (INDEPENDENT_AMBULATORY_CARE_PROVIDER_SITE_OTHER): Payer: Medicare HMO | Admitting: Gastroenterology

## 2020-02-06 VITALS — BP 132/90 | HR 79 | Ht 71.0 in | Wt 163.0 lb

## 2020-02-06 DIAGNOSIS — Z86010 Personal history of colonic polyps: Secondary | ICD-10-CM

## 2020-02-06 DIAGNOSIS — K219 Gastro-esophageal reflux disease without esophagitis: Secondary | ICD-10-CM

## 2020-02-06 MED ORDER — PLENVU 140 G PO SOLR: 1.0000 | Freq: Once | ORAL | 0 refills | Status: AC

## 2020-02-06 NOTE — Progress Notes (Signed)
    History of Present Illness: This is a 49 year old male with a history of sessile serrated polyps and sessile serrated adenomas.  He notes intermittent mild constipation and occasional small amounts of bright red blood on the tissue paper following bowel movements.  His reflux symptoms are under better control taking pantoprazole on a daily basis instead of as needed.  States his appetite fluctuates and his weight is stable.  No other gastrointestinal complaints.  September 14 and September 23 cardiology visits reviewed.  His cardiovascular problems are currently stable.  Current Medications, Allergies, Past Medical History, Past Surgical History, Family History and Social History were reviewed in [employer] record.   Physical Exam: General: Well developed, well nourished, no acute distress Head: Normocephalic and atraumatic Eyes:  sclerae anicteric, EOMI Ears: Normal auditory acuity Mouth: Not examined, mask on during Covid-19 pandemic Lungs: Clear throughout to auscultation Heart: Regular rate and rhythm; no murmurs, rubs or bruits Abdomen: Soft, non tender and non distended. No masses, hepatosplenomegaly or hernias noted. Normal Bowel sounds Rectal: Deferred to colonoscopy  Musculoskeletal: Symmetrical with no gross deformities  Pulses:  Normal pulses noted Extremities: No clubbing, cyanosis, edema or deformities noted Neurological: Alert oriented x 4, grossly nonfocal Psychological:  Alert and cooperative. Normal mood and affect   Assessment and Recommendations:  1. History of SSP/SSA and small volume hematochezia. He is due for surveillance colonoscopy and his cardiovascular problems are all stable per recent cardiology evaluation. The risks (including bleeding, perforation, infection, missed lesions, medication reactions and possible hospitalization or surgery if complications occur), benefits, and alternatives to colonoscopy with possible biopsy and  possible polypectomy were discussed with the patient and they consent to proceed.   2. History of cardiomyopathy. Echocardiogram 11/2019 shows EF=60-65%  3. S/P AAA, TAAA repairs, stable.   4. S/P AVR, stable.    5. CAD with stent. History of NSTEMI. Maintained on EC ASA 81 mg qd.   6. HTN.   7. Chronic left abdomen, left chest pain.   8. GERD. Follow antireflux measures. Continue pantoprazole 40 mg po qd long term.   9. CKD.

## 2020-02-10 ENCOUNTER — Encounter (HOSPITAL_COMMUNITY): Payer: Self-pay

## 2020-02-10 ENCOUNTER — Ambulatory Visit (HOSPITAL_COMMUNITY)
Admission: EM | Admit: 2020-02-10 | Discharge: 2020-02-10 | Disposition: A | Discharge status: Home / Self Care | Payer: Medicare HMO | Attending: Emergency Medicine | Admitting: Emergency Medicine

## 2020-02-10 ENCOUNTER — Other Ambulatory Visit: Payer: Self-pay

## 2020-02-10 DIAGNOSIS — H0014 Chalazion left upper eyelid: Secondary | ICD-10-CM | POA: Diagnosis not present

## 2020-02-10 DIAGNOSIS — H6002 Abscess of left external ear: Secondary | ICD-10-CM | POA: Diagnosis not present

## 2020-02-10 MED ORDER — DOXYCYCLINE HYCLATE 100 MG PO CAPS: 100.0000 mg | ORAL_CAPSULE | Freq: Two times a day (BID) | ORAL | 0 refills | Status: AC

## 2020-02-10 MED ORDER — HYDROCODONE-ACETAMINOPHEN 5-325 MG PO TABS: 1.0000 | ORAL_TABLET | Freq: Four times a day (QID) | ORAL | 0 refills | Status: DC | PRN

## 2020-02-10 NOTE — ED Provider Notes (Signed)
[place]    CSN: [id number] Arrival date & time: 02/10/20  1211      History   Chief Complaint Chief Complaint  Patient presents with  . Belepharitis    Left eye  . Otalgia    HPI [name] is a 49 y.o. male history of tobacco use, CKD, hypertension, presenting today for evaluation of left eye and ear swelling.  Patient reports recently noticed a blackhead behind his left ear, attempted popping it.  Since he has developed increased pain swelling behind his ear and into his neck.  He denies limited range of motion but feels increased pain and pressure around this area with movement of neck.  Denies fevers.  Has had some drainage.  Denies difficulty swallowing or breathing.  Also reports area of swelling to left upper eyelid.  Has been intermittent over the past month.  Only reports some mild irritation with this.  Denies any changes in vision or eye pain.  Of note blood pressure elevated, reports did not take blood pressure medicine prior to arrival.   HPI  Past Medical History:  Diagnosis Date  . Adenomatous colon polyp 08/2015  . Anxiety   . Aortic disease ([place])   . Aortic dissection (HCC)    a. admx 04/2014 >> L renal infarct; a/c renal failure >> b.  s/p Bioprosthetic [place] and total arch replacement and staged endovascular repair of descending aortic aneurysm ([facility] - Dr. [name])  . CAD (coronary artery disease)    a. LHC 4/16:  oD1 60%  . Cardiomyopathy ([place])    a. non-ischemic - probably related to untreated HTN and ETOH abuse - Echo 3/13 with EF 35-40% >> b. Echo 4/16: Severe LVH, EF 55-60%, moderate AI, moderate MR, mild LAE, trivial effusion, known type B dissection with communication between true and false lumens with suprasternal images suggesting dissection plane may propagate to at least left subclavian takeoff, root above aortic valve okay    . Chronic abdominal pain   . Chronic combined systolic and diastolic congestive heart failure ([place])     a. 05/2011: Adm with pulm edema/HTN urgency, EF 35-40% with diffuse hypokinesis and moderate to severe mitral regurgitation. Cardiomyopathy likely due to uncontrolled HTN and ETOH abuse - cath deferred due to renal insufficiency (felt due to uncontrolled HTN). bJodie Echevaria MV 06/2011: EF 37% and no ischemia or infarction. c. EF 45-50% by echo 01/2012.  [name] Chronic sinusitis   . CKD (chronic kidney disease)    a. 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  peak creatinine 3.46 during admx for aortic dissection 2/16.  sees Dr [name]  . DDD (degenerative disc disease), lumbar   . Depression   . Descending thoracic aortic aneurysm ([place])   . Dissecting aneurysm of thoracic aorta ([place])   . ETOH abuse    a. Reported to have quit 05/2011.  . Frequent headaches   . GERD (gastroesophageal reflux disease)   . Headache(784.0)    "q other day" (08/08/2013)  . Heart murmur   . Hemorrhoid thrombosis   . History of echocardiogram    Echo 1/17:  Severe LVH, EF 55-60%, no RWMA, Gr 2 DD, AVR ok, mild to mod MR, mild LAE, mild reduced RVSF, mod RAE  . History of medication noncompliance   . HYPERLIPIDEMIA   . Hypertension    a. Hx of HTN urgency secondary to noncompliance. b. urinary metanephrine and catecholeamine levels normal 2013.  c. Renal art [name] 1/16:  No evidence of renal artery  stenosis noted bilaterally.  . INGUINAL HERNIA   . Pneumonia ~ 2013  . Renal insufficiency   . Serrated adenoma of colon 08/2015  . Stroke ([place])   . Tobacco abuse   . Valvular heart disease    a. Echo 05/2011: moderate to severe eccentric MR and mild to moderate AI with prolapsing left coronary cusp. b. Echo 01/2012: mild-mod AI, mild dilitation of aortic root, mild MR.;  c. Echo 1/16: Severe LVH consistent with hypertrophic cardio myopathy, EF 50%, no RWMA, mod AI, mild MR, mild RAE, dilated Ao root (40 mm);     Patient Active Problem List   Diagnosis Date Noted  . Chest pain 11/24/2019  . COPD (chronic obstructive pulmonary disease)  ([place]) 11/24/2019  . Nonspecific chest pain   . Chest pain, rule out acute myocardial infarction 09/24/2018  . Chronic systolic (congestive) heart failure ([place]) 04/25/2018  . Acute on chronic combined systolic and diastolic CHF (congestive heart failure) ([place]) 12/08/2017  . Coronary artery disease involving native coronary artery of native heart with angina pectoris ([place]) 12/06/2017  . NSTEMI (non-ST elevated myocardial infarction) ([place]) 12/02/2017  . Hypertensive emergency - Accelerated Hypertension 12/02/2017  . History of stroke 10/05/2017  . Somnolence, daytime 04/07/2017  . Snoring 04/07/2017  . Cerebral infarction due to embolism of left middle cerebral artery ([place]) 06/03/2016  . History of aortic dissection 06/03/2016  . Palpitation   . Cerebrovascular accident (CVA) due to embolism of left middle cerebral artery ([place])   . Seizures ([place]) 02/28/2016  . HLD (hyperlipidemia) 08/28/2015  . 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[place]) 06/07/2015  . Cardiomyopathy ([place]) 03/01/2015  . Acid reflux 03/01/2015  . Essential hypertension 03/01/2015  . S/P aortic dissection repair 10/03/2014  . H/O aortic valve replacement 10/03/2014  . CKD (chronic kidney disease), stage IV ([place]) 05/15/2014  . AKI (acute kidney injury) ([place])   . Dissecting aneurysm of thoracic aorta, Stanford type B ([place]) 04/24/2014  . Aortic dissection ([place]) 04/23/2014  . Aneurysm of ascending aorta (HCC) 03/28/2014  . Dyspnea 03/27/2014  . ETOH abuse 09/20/2013  . Malignant hypertension 02/20/2013  . Hypertensive urgency 01/27/2012  . Cardiomyopathy, hypertensive ([place]) 01/26/2012  . AI (aortic insufficiency) 01/26/2012  . MR (mitral regurgitation) 01/26/2012  . Acute renal failure superimposed on stage 3 chronic kidney disease ([place]) 01/26/2012  . Hypertensive cardiomyopathy, without heart failure ([place]): EF reduced to 35-40% from 55%. 06/10/2011  . Chronic bronchitis 05/23/2011  . Cardiomegaly - hypertensive 05/22/2011   . Marijuana use 05/22/2011  . Abdominal pain 05/22/2011  . Hypertension, malignant 05/22/2011  . Anxiety and depression 05/22/2011  . Hyperlipidemia LDL goal <70 08/26/2009  . Tobacco use 08/26/2009  . Headache(784.0) 08/26/2009  . Aortic valve disorder 03/11/2009  . INGUINAL HERNIA 02/18/2009  . Uncontrolled hypertension 01/16/2009    Past Surgical History:  Procedure Laterality Date  . ANKLE SURGERY Bilateral    Fractures bilaterally  . AORTIC VALVE SURGERY  09/2014  . CORONARY ANGIOGRAPHY N/A 12/06/2017   Procedure: CORONARY ANGIOGRAPHY;  Surgeon: Belva Crome, MD;  Location: [facility];  Service: Cardiovascular;  Laterality: N/A;  . CORONARY ANGIOGRAPHY N/A 09/26/2018   Procedure: CORONARY ANGIOGRAPHY;  Surgeon: Nelva Bush, MD;  Location: [facility];  Service: Cardiovascular;  Laterality: N/A;  . CORONARY STENT INTERVENTION N/A 12/10/2017   Procedure: CORONARY STENT INTERVENTION;  Surgeon: [name] Sine, MD;  Location: [facility];  Service: Cardiovascular;  Laterality: N/A;  . FOOT FRACTURE SURGERY Bilateral 2004-2010   "  got pins in both of them"  . HEMORRHOID SURGERY N/A 06/15/2015   Procedure: HEMORRHOIDECTOMY;  Surgeon: Stark Klein, MD;  Location: [facility];  Service: General;  Laterality: N/A;  . INGUINAL HERNIA REPAIR Right ~ 1996  . LEFT HEART CATHETERIZATION WITH CORONARY ANGIOGRAM N/A 06/21/2014   Procedure: LEFT HEART CATHETERIZATION WITH CORONARY ANGIOGRAM;  Surgeon: Larey Dresser, MD;  Location[facility];  Service: Cardiovascular;  Laterality: N/A;  . LOOP RECORDER INSERTION N/A 06/19/2016   Procedure: Loop Recorder Insertion;  Surgeon: Deboraha Sprang, MD;  Location: [facility];  Service: Cardiovascular;  Laterality: N/A;  . TEE WITHOUT CARDIOVERSION N/A 03/12/2016   Procedure: TRANSESOPHAGEAL ECHOCARDIOGRAM (TEE);  Surgeon: Sanda Klein, MD;  [facility] ENDOSCOPY;  Service: Cardiovascular;  Laterality: N/A;       Home  Medications    Prior to Admission medications   Medication Sig Start Date End Date Taking? Authorizing Provider  acetaminophen (TYLENOL) 325 MG tablet Take 650 mg by mouth every 6 (six) hours as needed (for pain or headaches).     [provider]  albuterol (VENTOLIN HFA) 108 (90 Base) MCG/ACT inhaler Inhale into the lungs every 6 (six) hours as needed for wheezing or shortness of breath.    [provider]  amitriptyline (ELAVIL) 50 MG tablet Take 50 mg by mouth at bedtime.    [provider]  amLODipine (NORVASC) 10 MG tablet Take 10 mg by mouth daily.    [provider]  aspirin EC 81 MG EC tablet Take 1 tablet (81 mg total) by mouth daily. 12/12/17   Ledora Bottcher, PA  Blood Pressure Monitor KIT 1 Device by Does not apply route 2 (two) times daily. 11/29/18   Kerin Perna, NP  carvedilol (COREG) 25 MG tablet Take 25 mg by mouth 2 (two) times daily with a meal.    [provider]  cloNIDine (CATAPRES - DOSED IN MG/24 HR) 0.3 mg/24hr patch Place 1 patch (0.3 mg total) onto the skin once a week. 09/28/18   Arrien, Jimmy Picket, MD  doxazosin (CARDURA) 8 MG tablet Take 2 tablets (16 mg total) by mouth at bedtime. 12/07/19   Skeet Latch, MD  doxycycline (VIBRAMYCIN) 100 MG capsule Take 1 capsule (100 mg total) by mouth 2 (two) times daily for 10 days. 02/10/20 02/20/20  Wieters, Hallie C, PA-C  empagliflozin (JARDIANCE) 10 MG TABS tablet Take 1 tablet (10 mg total) by mouth daily. 12/25/19   Larey Dresser, MD  Evolocumab (REPATHA SURECLICK) 256 MG/ML SOAJ Inject 1 Dose into the skin every 14 (fourteen) days. 11/29/19   Hilty, Nadean Corwin, MD  hydrALAZINE (APRESOLINE) 50 MG tablet Take 1 tablet (50 mg total) by mouth 3 (three) times daily. Patient taking differently: Take 50 mg by mouth in the morning and at bedtime.  12/25/19   Larey Dresser, MD  HYDROcodone-acetaminophen (NORCO/VICODIN) 5-325 MG tablet Take 1-2 tablets by mouth every  6 (six) hours as needed for severe pain. 02/10/20   Wieters, Hallie C, PA-C  minoxidil (LONITEN) 10 MG tablet TAKE 2 TABLETS(20 MG) BY MOUTH TWICE DAILY 01/12/20   Skeet Latch, MD  pantoprazole (PROTONIX) 40 MG tablet Take 1 tablet (40 mg total) by mouth daily. 11/28/19   Larey Dresser, MD  STIOLTO RESPIMAT 2.5-2.5 MCG/ACT AERS INHALE 2 PUFFS INTO THE LUNGS DAILY 09/06/19   Spero Geralds, MD    Family History Family History  Problem Relation Age of Onset  . Hypertension  Mother   . Hypertension Other   . Colon cancer Paternal Uncle   . Stroke Maternal Aunt   . Heart attack Brother   . Hypertension Brother   . Diabetes Maternal Aunt   . Lung cancer Maternal Uncle   . Stroke Maternal Uncle   . Other Sister        "breathing machine at night"  . Headache Sister   . Thyroid disease Sister   . Throat cancer Neg Hx   . Pancreatic cancer Neg Hx   . Esophageal cancer Neg Hx   . Kidney disease Neg Hx   . Liver disease Neg Hx     Social History Social History   Tobacco Use  . Smoking status: Current Some Day Smoker    Packs/day: 0.25    Years: 23.00    Pack years: 5.75    Types: Cigarettes  . Smokeless tobacco: Never Used  . Tobacco comment: 3 to 4 per day  Vaping Use  . Vaping Use: Never assessed  Substance Use Topics  . Alcohol use: Yes    Alcohol/week: 1.0 standard drink    Types: 1 Cans of beer per week    Comment: occasionally  . Drug use: Yes    Types: Marijuana    Comment: 2 times/week     Allergies   Imdur [isosorbide nitrate] and Tramadol   Review of Systems Review of Systems  Constitutional: Negative for fatigue and fever.  Eyes: Negative for redness, itching and visual disturbance.  Respiratory: Negative for shortness of breath.   Cardiovascular: Negative for chest pain and leg swelling.  Gastrointestinal: Negative for nausea and vomiting.  Musculoskeletal: Positive for neck pain. Negative for arthralgias and myalgias.  Skin: Positive for  color change. Negative for rash and wound.  Neurological: Negative for dizziness, syncope, weakness, light-headedness and headaches.     Physical Exam Triage Vital Signs ED Triage Vitals  Enc Vitals Group     BP 02/10/20 1307 (!) 213/88     Pulse Rate 02/10/20 1307 (!) 56     Resp 02/10/20 1307 18     Temp 02/10/20 1307 98.9 F (37.2 C)     Temp Source 02/10/20 1307 Oral     SpO2 02/10/20 1307 100 %     Weight --      Height --      Head Circumference --      Peak Flow --      Pain Score 02/10/20 1309 10     Pain Loc --      Pain Edu? --      Excl. in [place]? --    No data found.  Updated Vital Signs BP (!) 213/88 (BP Location: Right Arm)   Pulse (!) 56   Temp 98.9 F (37.2 C) (Oral)   Resp 18   SpO2 100%   Visual Acuity Right Eye Distance:   Left Eye Distance:   Bilateral Distance:    Right Eye Near:   Left Eye Near:    Bilateral Near:     Physical Exam Vitals and nursing note reviewed.  Constitutional:      Appearance: He is well-developed.     Comments: No acute distress  HENT:     Head: Normocephalic and atraumatic.     Ears:     Comments: Bilateral ears without tenderness to palpation of external auricle, tragus and mastoid, EAC's without erythema or swelling, TM's with good bony landmarks and cone of light. Non erythematous.  Nose: Nose normal.     Mouth/Throat:     Comments: Oral mucosa pink and moist, no tonsillar enlargement or exudate. Posterior pharynx patent and nonerythematous, no uvula deviation or swelling. Normal phonation.  No soft palate swelling Eyes:     Conjunctiva/sclera: Conjunctivae normal.  Neck:     Comments: Full active range of motion of neck Left postauricular area extending to left lateral neck with area of erythema and induration, no fluctuance Cardiovascular:     Rate and Rhythm: Normal rate.  Pulmonary:     Effort: Pulmonary effort is normal. No respiratory distress.  Abdominal:     General: There is no distension.    Musculoskeletal:        General: Normal range of motion.     Cervical back: Neck supple.  Skin:    General: Skin is warm and dry.  Neurological:     Mental Status: He is alert and oriented to person, place, and time.      UC Treatments / Results  Labs (all labs ordered are listed, but only abnormal results are displayed) Labs Reviewed - No data to display  EKG   Radiology No results found.  Procedures Procedures (including critical care time)  Medications Ordered in UC Medications - No data to display  Initial Impression / Assessment and Plan / UC Course  I have reviewed the triage vital signs and the nursing notes.  Pertinent labs & imaging results that were available during my care of the patient were reviewed by me and considered in my medical decision making (see chart for details).     1.  Postauricular abscess/cellulitis-initiating doxycycline, warm compresses and Tylenol, hydrocodone for severe pain.  Full active range of motion of neck, no fever tachycardia, low suspicion of deep space abscess.  Advised to continue to monitor, if symptoms progressing or worsening despite oral antibiotics patient to follow-up here or emergency room.  2.  Left upper eyelid swelling-stye versus chalazion, continue warm compresses and monitoring for improvement with antibiotics.  Discussed strict return precautions. Patient verbalized understanding and is agreeable with plan.  Final Clinical Impressions(s) / UC Diagnoses   Final diagnoses:  Abscess of external ear, left  Chalazion of left upper eyelid     Discharge Instructions     Begin doxycycline twice daily for the next week Warm compresses to area Tylenol for mild to moderate pain May use hydrocodone for severe pain-use sparingly, do not drive or work after taking Please follow-up with area not improving or worsening despite use of the above, developing fevers, difficulty swallowing or neck stiffness    ED  Prescriptions    Medication Sig Dispense Auth. Provider   doxycycline (VIBRAMYCIN) 100 MG capsule Take 1 capsule (100 mg total) by mouth 2 (two) times daily for 10 days. 20 capsule Abriel Geesey C, PA-C   HYDROcodone-acetaminophen (NORCO/VICODIN) 5-325 MG tablet Take 1-2 tablets by mouth every 6 (six) hours as needed for severe pain. 8 tablet Yeriel Mineo, [place] C, PA-C     I have reviewed the PDMP during this encounter.   Janith Lima, PA-C 02/10/20 1356

## 2020-02-10 NOTE — ED Triage Notes (Addendum)
Pt present left eyelid and ear swelling. Pt states left side of his neck and behind his ear is very painful.  Pt attempted to bust a black head and that's when the left side of his face is swollen and red. Pt states symptoms started a few days ago.

## 2020-02-10 NOTE — Discharge Instructions (Signed)
Begin doxycycline twice daily for the next week Warm compresses to area Tylenol for mild to moderate pain May use hydrocodone for severe pain-use sparingly, do not drive or work after taking Please follow-up with area not improving or worsening despite use of the above, developing fevers, difficulty swallowing or neck stiffness

## 2020-02-12 ENCOUNTER — Telehealth: Payer: Self-pay | Admitting: Gastroenterology

## 2020-02-12 NOTE — Telephone Encounter (Signed)
Called pharmacy to try and run Plenvu coupon and patient was not eligible for coupon , Called patient and switched his prep to Miralax so he could purchase his prep OTC.... We have no samples to give him. Printed new instructions for patient which he said he would view through My Chart

## 2020-02-13 DIAGNOSIS — Z9889 Other specified postprocedural states: Secondary | ICD-10-CM | POA: Insufficient documentation

## 2020-02-14 ENCOUNTER — Encounter: Payer: Medicare HMO | Admitting: Gastroenterology

## 2020-02-14 ENCOUNTER — Other Ambulatory Visit: Payer: Self-pay | Admitting: Gastroenterology

## 2020-02-15 ENCOUNTER — Other Ambulatory Visit: Payer: Self-pay

## 2020-02-15 ENCOUNTER — Telehealth (INDEPENDENT_AMBULATORY_CARE_PROVIDER_SITE_OTHER): Payer: Medicare HMO | Admitting: Internal Medicine

## 2020-02-15 ENCOUNTER — Encounter: Payer: Self-pay | Admitting: Internal Medicine

## 2020-02-15 ENCOUNTER — Ambulatory Visit: Payer: Medicare HMO

## 2020-02-15 VITALS — BP 161/103 | HR 113 | Ht 71.0 in | Wt 163.0 lb

## 2020-02-15 DIAGNOSIS — Z9889 Other specified postprocedural states: Secondary | ICD-10-CM

## 2020-02-15 DIAGNOSIS — I63412 Cerebral infarction due to embolism of left middle cerebral artery: Secondary | ICD-10-CM | POA: Diagnosis not present

## 2020-02-15 DIAGNOSIS — I429 Cardiomyopathy, unspecified: Secondary | ICD-10-CM | POA: Diagnosis not present

## 2020-02-15 LAB — SARS CORONAVIRUS 2 (TAT 6-24 HRS): SARS Coronavirus 2: NEGATIVE

## 2020-02-15 NOTE — Patient Instructions (Addendum)
Medication Instructions:  *If you need a refill on your cardiac medications before your next appointment, please call your pharmacy*  Testing/Procedures: Your physician has recommended that you have a Linq ICM (Loop recorder) explanted.   Follow-Up: At [facility], you and your health needs are our priority.  As part of our continuing mission to provide you with exceptional heart care, we have created designated Provider Care Teams.  These Care Teams include your primary Cardiologist (physician) and Advanced Practice Providers (APPs -  Physician Assistants and Nurse Practitioners) who all work together to provide you with the care you need, when you need it.  We recommend signing up for the patient portal called "MyChart".  Sign up information is provided on this After Visit Summary.  MyChart is used to connect with patients for Virtual Visits (Telemedicine).  Patients are able to view lab/test results, encounter notes, upcoming appointments, etc.  Non-urgent messages can be sent to your provider as well.   To learn more about what you can do with MyChart, go to [email or web address].    Your next appointment:   Your physician recommends that you schedule a follow-up appointment in: 05/20/20 at 8:30 am to have your Loop Recorder Explanted  The format for your next appointment:    In Person with [name], MD

## 2020-02-15 NOTE — Progress Notes (Deleted)
HPI:  [name] is a 49 y.o. male patient of Dr Oval Linsey, with a PMH below who presents today for advanced hypertension clinic follow up.  He was seen by Dr. Oval Linsey in mid-August with a blood pressure of 182/100.  There was concern at that time about compliance with medications, and he was given instructions to simplify his medication regimen to three times daily.      He returns today for follow up.  He has been working on some lifestyle modifications, including trying to decrease his caffeine intake and stop smoking.  Apparently waiting for insurance to send nicotine replacement patches.  Occasionally will miss mid-day dose of hydralazine, but states takes all doses most days.  States still has problems after cracked ribs during aortic dissection surgery, this keeps him from being as active as he would like with his grandchildren.    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  minoxidil 20 mg twice daily  Family Hx: father, brother and sister all with hypertension; (siblings younger than pt); maternal uncle with MI at 74; 55 kids, 63 yr son with hypertension,   Social Hx:  Trying to quit, [employer] to send patches, down to 7 cigarettes per day; occasional alcohol; 2 coffee per week , soda biw ([employer])  Diet:  Home cooked, using Ms. Karlene Einstein veggies, fresh;   Exercise: no formal exercise, just with grandkids,  Continues to have discomfort from cracked ribs (post surgery)   Home BP  readings:  Using apple watch, not accurate; has home cuff but no readings with him today  Intolerances: isosorbide and  higher doses of hydralazine - headaches  Labs:  9/21:  Na 139, K 4.1, Glu 97, BUN 24, Scr 3.31, GFR 21  Wt Readings from Last 3 Encounters:  02/06/20 163 lb (73.9 kg)  01/11/20 158 lb (71.7 kg)  12/25/19 155 lb 3.2 oz (70.4 kg)   BP Readings from Last 3 Encounters:  02/10/20 (!) 213/88  02/06/20 132/90  01/11/20 (!) 158/96   Pulse Readings from Last 3 Encounters:  02/10/20 (!) 56  02/06/20 79  01/11/20 93    Current Outpatient Medications  Medication Sig Dispense Refill  . acetaminophen (TYLENOL) 325 MG tablet Take 650 mg by mouth every 6 (six) hours as needed (for pain or headaches).     [name] albuterol (VENTOLIN HFA) 108 (90 Base) MCG/ACT inhaler Inhale into the lungs every 6 (six) hours as needed for wheezing or shortness of breath.    [name] amitriptyline (ELAVIL) 50 MG tablet Take 50 mg by mouth at bedtime.    [name] amLODipine (NORVASC) 10 MG tablet Take 10 mg by mouth daily.    [name] aspirin EC 81 MG EC tablet Take 1 tablet (81 mg total) by mouth daily. 90 tablet 3  . Blood Pressure Monitor KIT 1 Device by Does not apply route 2 (two) times daily. 1 kit  0  . carvedilol (COREG) 25 MG tablet Take 25 mg by mouth 2 (two) times daily with a meal.    . cloNIDine (CATAPRES - DOSED IN MG/24 HR) 0.3 mg/24hr patch Place 1 patch (0.3 mg total) onto the skin once a week. 4 patch 6  . doxazosin (CARDURA) 8 MG tablet Take 2 tablets (16 mg total) by mouth at bedtime. 180 tablet 1  . doxycycline (VIBRAMYCIN) 100 MG capsule Take 1 capsule (100 mg total) by mouth 2 (two) times daily for 10 days. 20 capsule 0  . empagliflozin (JARDIANCE) 10 MG TABS tablet Take 1 tablet (10 mg total) by mouth daily. 30 tablet 11  . Evolocumab (REPATHA SURECLICK) 782 MG/ML SOAJ Inject 1 Dose into the skin every 14 (fourteen) days. 2 mL 11  . hydrALAZINE (APRESOLINE) 50 MG tablet Take 1 tablet (50 mg total) by  mouth 3 (three) times daily. (Patient taking differently: Take 50 mg by mouth in the morning and at bedtime. ) 270 tablet 3  . HYDROcodone-acetaminophen (NORCO/VICODIN) 5-325 MG tablet Take 1-2 tablets by mouth every 6 (six) hours as needed for severe pain. 8 tablet 0  . minoxidil (LONITEN) 10 MG tablet TAKE 2 TABLETS(20 MG) BY MOUTH TWICE DAILY 90 tablet 3  . pantoprazole (PROTONIX) 40 MG tablet Take 1 tablet (40 mg total) by mouth daily. 30 tablet 6  . STIOLTO RESPIMAT 2.5-2.5 MCG/ACT AERS INHALE 2 PUFFS INTO THE LUNGS DAILY 4 g 5   No current facility-administered medications for this visit.    Allergies  Allergen Reactions  . Imdur [Isosorbide Nitrate] Other (See Comments)    Headaches   . Tramadol Other (See Comments)    Headaches     Past Medical History:  Diagnosis Date  . Adenomatous colon polyp 08/2015  . Anxiety   . Aortic disease ([place])   . Aortic dissection ([facility])    a. admx 04/2014 >> L renal infarct; a/c renal failure >> b.  s/p Bioprosthetic [place] and total arch replacement and staged endovascular repair of descending aortic aneurysm ([facility] - Dr. [name])  . CAD (coronary artery disease)    a. LHC 4/16:  oD1 60%  . Cardiomyopathy ([place])    a. non-ischemic - probably related to untreated HTN and ETOH abuse - Echo 3/13 with EF 35-40% >> b. Echo 4/16: Severe LVH, EF 55-60%, moderate AI, moderate MR, mild LAE, trivial effusion, known type B dissection with communication between true and false lumens with suprasternal images suggesting dissection plane may propagate to at least left subclavian takeoff, root above aortic valve okay    . Chronic abdominal pain   . Chronic combined systolic and diastolic congestive heart failure ([place])    a. 05/2011: Adm with pulm edema/HTN urgency, EF 35-40% with diffuse hypokinesis and moderate to severe mitral regurgitation. Cardiomyopathy likely due to uncontrolled HTN and ETOH abuse - cath deferred due to renal insufficiency (felt due to  uncontrolled HTN). bJodie Echevaria MV 06/2011: EF 37% and no ischemia or infarction. c. EF 45-50% by echo 01/2012.  [name] Chronic sinusitis   . CKD (chronic kidney disease)    a. 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  peak creatinine 3.46 during admx for aortic dissection 2/16.  sees Dr [name]  . DDD (degenerative disc disease), lumbar   . Depression   . Descending thoracic aortic aneurysm ([place])   . Dissecting aneurysm of thoracic aorta ([place])   . ETOH abuse    a. Reported to have quit 05/2011.  . Frequent headaches   .  GERD (gastroesophageal reflux disease)   . Headache(784.0)    "q other day" (08/08/2013)  . Heart murmur   . Hemorrhoid thrombosis   . History of echocardiogram    Echo 1/17:  Severe LVH, EF 55-60%, no RWMA, Gr 2 DD, AVR ok, mild to mod MR, mild LAE, mild reduced RVSF, mod RAE  . History of medication noncompliance   . HYPERLIPIDEMIA   . Hypertension    a. Hx of HTN urgency secondary to noncompliance. b. urinary metanephrine and catecholeamine levels normal 2013.  c. Renal art [name] 1/16:  No evidence of renal artery stenosis noted bilaterally.  . INGUINAL HERNIA   . Pneumonia ~ 2013  . Renal insufficiency   . Serrated adenoma of colon 08/2015  . Stroke ([place])   . Tobacco abuse   . Valvular heart disease    a. Echo 05/2011: moderate to severe eccentric MR and mild to moderate AI with prolapsing left coronary cusp. b. Echo 01/2012: mild-mod AI, mild dilitation of aortic root, mild MR.;  c. Echo 1/16: Severe LVH consistent with hypertrophic cardio myopathy, EF 50%, no RWMA, mod AI, mild MR, mild RAE, dilated Ao root (40 mm);     There were no vitals taken for this visit.  No problem-specific Assessment & Plan notes found for this encounter.

## 2020-02-15 NOTE — Progress Notes (Signed)
Electrophysiology TeleHealth Note   Due to national recommendations of social distancing due to COVID 19, an audio/video telehealth visit is felt to be most appropriate for this patient at this time.  See MyChart message from today for the patient's consent to telehealth for [facility].   Date:  02/15/2020   ID:  [name], DOB April 25, 1970, MRN [id number]  Location: patient's home  Provider location: [address] [place]  Evaluation Performed: Follow-up visit  PCP:  Kerin Perna, NP  Cardiologist:  DMc Electrophysiologist:  SK   Chief Complaint:     History of Present Illness:    [name] is a 48 y.o. male who presents via audio/video conferencing for a telehealth visit today.  Since last being seen by our clinic for cryptogenic stroke for which he has undergone a loop recorder device now at [facility] the patient reports  Doing well   History of aortic dissection in the setting of poorly controlled hypertension/LVH requiring a stent graft in the bioprosthetic aortic valve with a 27 mm valve conduit and total arch replacement and subsequent endovascular repair of a descending thoracic aortic aneurysm  Interval NSTEMI 9/19 with stenting of his proximal ramus  Currently followed in the heart failure clinic;   Date Cr K Hgb  10/21 2.96 4.0 12.8         DATE TEST EF   1/20 PYP  Negative   ratio 1.0  9/21 Myoview  35% Inf perfusion defect  9/21 Echo  60-65% LVH 19/17 mm   occ fleeting chest pains--dur up to 30 min, provoked by no specific activity No pND or orthopnea; no edema   Pt has not had Covid; has not taken Vaccine.   The patient denies symptoms of fevers, chills, cough, or new SOB worrisome for COVID 19.   Past Medical History:  Diagnosis Date  . Adenomatous colon polyp 08/2015  . Anxiety   . Aortic disease ([place])   . Aortic dissection ([facility])    a. admx 04/2014 >> L renal infarct; a/c renal failure >> b.  s/p Bioprosthetic [place] and  total arch replacement and staged endovascular repair of descending aortic aneurysm ([facility] - Dr. [name])  . CAD (coronary artery disease)    a. LHC 4/16:  oD1 60%  . Cardiomyopathy ([place])    a. non-ischemic - probably related to untreated HTN and ETOH abuse - Echo 3/13 with EF 35-40% >> b. Echo 4/16: Severe LVH, EF 55-60%, moderate AI, moderate MR, mild LAE, trivial effusion, known type B dissection with communication between true and false lumens with suprasternal images suggesting dissection plane may propagate to at least left subclavian takeoff, root above aortic valve okay    . Chronic abdominal pain   . Chronic combined systolic and diastolic congestive heart failure ([place])    a. 05/2011: Adm with pulm edema/HTN urgency, EF 35-40% with diffuse hypokinesis and moderate to severe mitral regurgitation. Cardiomyopathy likely due to uncontrolled HTN and ETOH abuse - cath deferred due to renal insufficiency (felt due to uncontrolled HTN). bJodie Echevaria MV 06/2011: EF 37% and no ischemia or infarction. c. EF 45-50% by echo 01/2012.  [name] Chronic sinusitis   . CKD (chronic kidney disease)    a. 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  peak creatinine 3.46 during admx for aortic dissection 2/16.  sees Dr [name]  . DDD (degenerative disc disease), lumbar   . Depression   . Descending thoracic aortic aneurysm ([place])   .  Dissecting aneurysm of thoracic aorta ([place])   . ETOH abuse    a. Reported to have quit 05/2011.  . Frequent headaches   . GERD (gastroesophageal reflux disease)   . Headache(784.0)    "q other day" (08/08/2013)  . Heart murmur   . Hemorrhoid thrombosis   . History of echocardiogram    Echo 1/17:  Severe LVH, EF 55-60%, no RWMA, Gr 2 DD, AVR ok, mild to mod MR, mild LAE, mild reduced RVSF, mod RAE  . History of medication noncompliance   . HYPERLIPIDEMIA   . Hypertension    a. Hx of HTN urgency secondary to noncompliance. b. urinary metanephrine and catecholeamine levels normal 2013.  c. Renal art [name]  1/16:  No evidence of renal artery stenosis noted bilaterally.  . INGUINAL HERNIA   . Pneumonia ~ 2013  . Renal insufficiency   . Serrated adenoma of colon 08/2015  . Stroke ([place])   . Tobacco abuse   . Valvular heart disease    a. Echo 05/2011: moderate to severe eccentric MR and mild to moderate AI with prolapsing left coronary cusp. b. Echo 01/2012: mild-mod AI, mild dilitation of aortic root, mild MR.;  c. Echo 1/16: Severe LVH consistent with hypertrophic cardio myopathy, EF 50%, no RWMA, mod AI, mild MR, mild RAE, dilated Ao root (40 mm);     Past Surgical History:  Procedure Laterality Date  . ANKLE SURGERY Bilateral    Fractures bilaterally  . AORTIC VALVE SURGERY  09/2014  . CORONARY ANGIOGRAPHY N/A 12/06/2017   Procedure: CORONARY ANGIOGRAPHY;  Surgeon: Belva Crome, MD;  Location: [facility];  Service: Cardiovascular;  Laterality: N/A;  . CORONARY ANGIOGRAPHY N/A 09/26/2018   Procedure: CORONARY ANGIOGRAPHY;  Surgeon: Nelva Bush, MD;  Location: [place] [facility];  Service: Cardiovascular;  Laterality: N/A;  . CORONARY STENT INTERVENTION N/A 12/10/2017   Procedure: CORONARY STENT INTERVENTION;  Surgeon: [name] Sine, MD;  Location: [facility];  Service: Cardiovascular;  Laterality: N/A;  . FOOT FRACTURE SURGERY Bilateral 2004-2010   "got pins in both of them"  . HEMORRHOID SURGERY N/A 06/15/2015   Procedure: HEMORRHOIDECTOMY;  Surgeon: Stark Arnika Larzelere, MD;  Location: [facility];  Service: General;  Laterality: N/A;  . INGUINAL HERNIA REPAIR Right ~ 1996  . LEFT HEART CATHETERIZATION WITH CORONARY ANGIOGRAM N/A 06/21/2014   Procedure: LEFT HEART CATHETERIZATION WITH CORONARY ANGIOGRAM;  Surgeon: Larey Dresser, MD;  Location: [facility] CATH LAB;  Service: Cardiovascular;  Laterality: N/A;  . LOOP RECORDER INSERTION N/A 06/19/2016   Procedure: Loop Recorder Insertion;  Surgeon: Deboraha Sprang, MD;  Location: [facility];  Service: Cardiovascular;  Laterality: N/A;  .  TEE WITHOUT CARDIOVERSION N/A 03/12/2016   Procedure: TRANSESOPHAGEAL ECHOCARDIOGRAM (TEE);  Surgeon: Sanda Clairissa Valvano, MD;  Location[facility] ENDOSCOPY;  Service: Cardiovascular;  Laterality: N/A;    Current Outpatient Medications  Medication Sig Dispense Refill  . acetaminophen (TYLENOL) 325 MG tablet Take 650 mg by mouth every 6 (six) hours as needed (for pain or headaches).     [name] albuterol (VENTOLIN HFA) 108 (90 Base) MCG/ACT inhaler Inhale into the lungs every 6 (six) hours as needed for wheezing or shortness of breath.    [name] amitriptyline (ELAVIL) 50 MG tablet Take 50 mg by mouth at bedtime.    [name] amLODipine (NORVASC) 10 MG tablet Take 10 mg by mouth daily.    [name] aspirin EC 81 MG EC tablet Take 1 tablet (81 mg total) by  mouth daily. 90 tablet 3  . Blood Pressure Monitor KIT 1 Device by Does not apply route 2 (two) times daily. 1 kit 0  . carvedilol (COREG) 25 MG tablet Take 25 mg by mouth 2 (two) times daily with a meal.    . cloNIDine (CATAPRES - DOSED IN MG/24 HR) 0.3 mg/24hr patch Place 1 patch (0.3 mg total) onto the skin once a week. 4 patch 6  . doxazosin (CARDURA) 8 MG tablet Take 2 tablets (16 mg total) by mouth at bedtime. 180 tablet 1  . doxycycline (VIBRAMYCIN) 100 MG capsule Take 1 capsule (100 mg total) by mouth 2 (two) times daily for 10 days. 20 capsule 0  . empagliflozin (JARDIANCE) 10 MG TABS tablet Take 1 tablet (10 mg total) by mouth daily. 30 tablet 11  . Evolocumab (REPATHA SURECLICK) 096 MG/ML SOAJ Inject 1 Dose into the skin every 14 (fourteen) days. 2 mL 11  . hydrALAZINE (APRESOLINE) 50 MG tablet Take 1 tablet (50 mg total) by mouth 3 (three) times daily. (Patient taking differently: Take 50 mg by mouth in the morning and at bedtime. ) 270 tablet 3  . HYDROcodone-acetaminophen (NORCO/VICODIN) 5-325 MG tablet Take 1-2 tablets by mouth every 6 (six) hours as needed for severe pain. 8 tablet 0  . minoxidil (LONITEN) 10 MG tablet TAKE 2 TABLETS(20 MG) BY MOUTH TWICE DAILY 90  tablet 3  . pantoprazole (PROTONIX) 40 MG tablet Take 1 tablet (40 mg total) by mouth daily. 30 tablet 6  . STIOLTO RESPIMAT 2.5-2.5 MCG/ACT AERS INHALE 2 PUFFS INTO THE LUNGS DAILY 4 g 5   No current facility-administered medications for this visit.    Allergies:   Imdur [isosorbide nitrate] and Tramadol   Social History:  The patient  reports that he has been smoking cigarettes. He has a 5.75 pack-year smoking history. He has never used smokeless tobacco. He reports current alcohol use of about 1.0 standard drink of alcohol per week. He reports current drug use. Drug: Marijuana.   Family History:  The patient's   family history includes Colon cancer in his paternal uncle; Diabetes in his maternal aunt; Headache in his sister; Heart attack in his brother; Hypertension in his brother, mother, and another family member; Lung cancer in his maternal uncle; Other in his sister; Stroke in his maternal aunt and maternal uncle; Thyroid disease in his sister.   ROS:  Please see the history of present illness.   All other systems are personally reviewed and negative.    Exam:    Vital Signs:  BP (!) 161/103   Pulse (!) 113   Ht _0  (1.803 m)   Wt 163 lb (73.9 kg)   BMI 22.73 kg/m     Labs/Other Tests and Data Reviewed:    Recent Labs: 11/02/2019: ALT 9; TSH 1.680 11/26/2019: Hemoglobin 12.8; Platelets 107 01/10/2020: BUN 25; Creatinine, Ser 2.96; Potassium 4.0; Sodium 139   Wt Readings from Last 3 Encounters:  02/15/20 163 lb (73.9 kg)  02/06/20 163 lb (73.9 kg)  01/11/20 158 lb (71.7 kg)     Other studies personally reviewed: Additional studies/ records that were reviewed today include   Last device remote is reviewed from [facility] PDF dated    ASSESSMENT & PLAN:   Cryptogenic Stroke  Hypertension refractory  S/P Aortic dissection  Renal Insufficiency  L Atrial enlargent  Hypertensive heart disease   LINQ at [facility] and we discussed removing-- he is inclined and will  schedule  Well compensated--  His LVH is really quite severe and sits at the extreme of what may be ascribable to HTN, even poorly controlled HTN and raises spectre for me as to whether he might have HCM     Patient not vaccinated.  Have discussed the merits of vaccination in terms of decreasing risk of infection serious and life-threatening infection particularly given the comorbidities of heart disease  Also discussed concerns about side effects     COVID 19 screen The patient denies symptoms of COVID 19 at this time.  The importance of social distancing was discussed today.  Follow-up:  Needs scheduling for loop explant Next remote discontinue  Current medicines are reviewed at length with the patient today.   The patient does not have concerns regarding his medicines.  The following changes were made today:  none  Labs/ tests ordered today include:   No orders of the defined types were placed in this encounter.     Patient Risk:  after full review of this patients clinical status, I feel that they are at moderate risk at this time.  Today, I have spent 8* minutes with the patient with telehealth technology discussing the above.  Signed, [name], MD  02/15/2020 12:56 PM     [address] [place] [address] [phone] (office) [phone] (fax)

## 2020-02-16 ENCOUNTER — Other Ambulatory Visit: Payer: Self-pay

## 2020-02-16 ENCOUNTER — Encounter: Payer: Self-pay | Admitting: Gastroenterology

## 2020-02-16 ENCOUNTER — Ambulatory Visit (AMBULATORY_SURGERY_CENTER): Payer: Medicare HMO | Admitting: Gastroenterology

## 2020-02-16 VITALS — BP 155/86 | HR 67 | Temp 98.9°F | Resp 21 | Ht 71.0 in | Wt 163.0 lb

## 2020-02-16 DIAGNOSIS — D123 Benign neoplasm of transverse colon: Secondary | ICD-10-CM

## 2020-02-16 DIAGNOSIS — K640 First degree hemorrhoids: Secondary | ICD-10-CM

## 2020-02-16 DIAGNOSIS — K921 Melena: Secondary | ICD-10-CM

## 2020-02-16 DIAGNOSIS — D124 Benign neoplasm of descending colon: Secondary | ICD-10-CM

## 2020-02-16 DIAGNOSIS — D125 Benign neoplasm of sigmoid colon: Secondary | ICD-10-CM

## 2020-02-16 DIAGNOSIS — Z86010 Personal history of colonic polyps: Secondary | ICD-10-CM

## 2020-02-16 MED ORDER — SODIUM CHLORIDE 0.9 % IV SOLN: 500.0000 mL | Freq: Once | INTRAVENOUS | Status: DC

## 2020-02-16 NOTE — Op Note (Addendum)
[name] Procedure Date: 02/16/2020 3:58 PM MRN: [id number] Endoscopist: Ladene Artist , MD Age: 49 Referring MD:  Date of Birth: Aug 08, 1970 Gender: Male Account #: [id number] Procedure:                Colonoscopy Indications:              High risk colon cancer surveillance: Personal                            history of sessile serrated colon polyp (less than                            10 mm in size) with no dysplasia. Hematochezia. Medicines:                Monitored Anesthesia Care Procedure:                Pre-Anesthesia Assessment:                           - Prior to the procedure, a History and Physical                            was performed, and patient medications and                            allergies were reviewed. The patient's tolerance of                            previous anesthesia was also reviewed. The risks                            and benefits of the procedure and the sedation                            options and risks were discussed with the patient.                            All questions were answered, and informed consent                            was obtained. Prior Anticoagulants: The patient has                            taken no previous anticoagulant or antiplatelet                            agents. ASA Grade Assessment: III - A patient with                            severe systemic disease. After reviewing the risks                            and benefits, the patient was deemed in  satisfactory condition to undergo the procedure.                           After obtaining informed consent, the colonoscope                            was passed under direct vision. Throughout the                            procedure, the patient's blood pressure, pulse, and                            oxygen saturations were monitored continuously. The                            Colonoscope was  introduced through the anus and                            advanced to the the cecum, identified by                            appendiceal orifice and ileocecal valve. The                            ileocecal valve, appendiceal orifice, and rectum                            were photographed. The quality of the bowel                            preparation was adequate after extensive lavage and                            suction. The colonoscopy was performed without                            difficulty. The patient tolerated the procedure                            well. Scope In: 4:05:53 PM Scope Out: 4:37:23 PM Scope Withdrawal Time: 0 hours 27 minutes 40 seconds  Total Procedure Duration: 0 hours 31 minutes 30 seconds  Findings:                 The perianal and digital rectal examinations were                            normal.                           A 30 mm polyp was found in the sigmoid colon. The                            polyp was pedunculated. The polyp was removed with  a hot snare. Resection and retrieval were complete.                           Nine sessile polyps were found in the sigmoid colon                            (7), descending colon (1) and transverse colon (1).                            The polyps were 6 to 9 mm in size. These polyps                            were removed with a cold snare. Resection and                            retrieval were complete.                           Internal hemorrhoids were found during                            retroflexion. The hemorrhoids were moderate and                            Grade I (internal hemorrhoids that do not prolapse).                           The exam was otherwise without abnormality on                            direct and retroflexion views. Complications:            No immediate complications. Estimated blood loss:                            None. Estimated Blood Loss:      Estimated blood loss: none. Impression:               - One 30 mm polyp in the sigmoid colon, removed                            with a hot snare. Resected and retrieved.                           - Nine 6 to 9 mm polyps in the sigmoid colon, in                            the descending colon and in the transverse colon,                            removed with a cold snare. Resected and retrieved.                           - Internal hemorrhoids.                           -  The examination was otherwise normal on direct                            and retroflexion views. Recommendation:           - Repeat colonoscopy, likely 1 - 3 years, date to                            be determined after pending pathology results are                            reviewed for surveillance based on pathology                            results with a more extensive bowel prep.                           - Patient has a contact number available for                            emergencies. The signs and symptoms of potential                            delayed complications were discussed with the                            patient. Return to normal activities tomorrow.                            Written discharge instructions were provided to the                            patient.                           - Resume previous diet.                           - Continue present medications.                           - Await pathology results.                           - No aspirin, ibuprofen, naproxen, or other                            non-steroidal anti-inflammatory drugs for 2 weeks                            after polyp removal. Ladene Artist, MD 02/16/2020 4:43:53 PM This report has been signed electronically.

## 2020-02-16 NOTE — Patient Instructions (Signed)
HANDOUTS PROVIDED ON: POLYPS & HEMORRHOIDS  The polyps removed today have been sent for pathology.  The results can take 1-3 weeks to receive.  When your next colonoscopy should occur will be based on the pathology results.    You may resume your previous diet and medication schedule.  NO ASPIRIN, IBUPROFEN, NAPROXEN, OR OTHER NSAIDs FOR 2 WEEKS AFTER POLYP REMOVAL.  Thank you for allowing us to care for you today!!!  YOU HAD AN ENDOSCOPIC PROCEDURE TODAY AT THE [facility]:   Refer to the procedure report that was given to you for any specific questions about what was found during the examination.  If the procedure report does not answer your questions, please call your gastroenterologist to clarify.  If you requested that your care partner not be given the details of your procedure findings, then the procedure report has been included in a sealed envelope for you to review at your convenience later.  YOU SHOULD EXPECT: Some feelings of bloating in the abdomen. Passage of more gas than usual.  Walking can help get rid of the air that was put into your GI tract during the procedure and reduce the bloating. If you had a lower endoscopy (such as a colonoscopy or flexible sigmoidoscopy) you may notice spotting of blood in your stool or on the toilet paper. If you underwent a bowel prep for your procedure, you may not have a normal bowel movement for a few days.  Please Note:  You might notice some irritation and congestion in your nose or some drainage.  This is from the oxygen used during your procedure.  There is no need for concern and it should clear up in a day or so.  SYMPTOMS TO REPORT IMMEDIATELY:   Following lower endoscopy (colonoscopy or flexible sigmoidoscopy):  Excessive amounts of blood in the stool  Significant tenderness or worsening of abdominal pains  Swelling of the abdomen that is new, acute  Fever of 100F or higher  For urgent or emergent issues, a  gastroenterologist can be reached at any hour by calling [phone]. Do not use MyChart messaging for urgent concerns.    DIET:  We do recommend a small meal at first, but then you may proceed to your regular diet.  Drink plenty of fluids but you should avoid alcoholic beverages for 24 hours.  ACTIVITY:  You should plan to take it easy for the rest of today and you should NOT DRIVE or use heavy machinery until tomorrow (because of the sedation medicines used during the test).    FOLLOW UP: Our staff will call the number listed on your records Tuesday morning between 7:15 am and 8:15 am to check on you and address any questions or concerns that you may have regarding the information given to you following your procedure. If we do not reach you, we will leave a message.  We will attempt to reach you two times.  During this call, we will ask if you have developed any symptoms of COVID 19. If you develop any symptoms (ie: fever, flu-like symptoms, shortness of breath, cough etc.) before then, please call [phone].  If you test positive for Covid 19 in the 2 weeks post procedure, please call and report this information to us.    If any biopsies were taken you will be contacted by phone or by letter within the next 1-3 weeks.  Please call us at [phone] if you have not heard about the biopsies in 3   3 weeks.    SIGNATURES/CONFIDENTIALITY: You and/or your care partner have signed paperwork which will be entered into your electronic medical record.  These signatures attest to the fact that that the information above on your After Visit Summary has been reviewed and is understood.  Full responsibility of the confidentiality of this discharge information lies with you and/or your care-partner.

## 2020-02-16 NOTE — Progress Notes (Signed)
To PACU, vss. Report to [email or web address] 

## 2020-02-16 NOTE — Progress Notes (Signed)
Called to room to assist during endoscopic procedure.  Patient ID and intended procedure confirmed with present staff. Received instructions for my participation in the procedure from the performing physician.  

## 2020-02-20 ENCOUNTER — Telehealth: Payer: Self-pay | Admitting: *Deleted

## 2020-02-20 NOTE — Telephone Encounter (Signed)
  Follow up Call-  Call back number 02/16/2020  Post procedure Call Back phone  # [phone]  Permission to leave phone message Yes  Some recent data might be hidden     Patient questions:  Do you have a fever, pain , or abdominal swelling? No. Pain Score  0 *  Have you tolerated food without any problems? Yes.    Have you been able to return to your normal activities? Yes.    Do you have any questions about your discharge instructions: Diet   No. Medications  No. Follow up visit  No.  Do you have questions or concerns about your Care? No.  Actions: * If pain score is 4 or above: 1. No action needed, pain <4.Have you developed a fever since your procedure? no  2.   Have you had an respiratory symptoms (SOB or cough) since your procedure? no  3.   Have you tested positive for COVID 19 since your procedure no  4.   Have you had any family members/close contacts diagnosed with the COVID 19 since your procedure?  no   If yes to any of these questions please route to Joylene John, RN and Joella Prince, RN

## 2020-02-29 ENCOUNTER — Telehealth (HOSPITAL_COMMUNITY): Payer: Self-pay | Admitting: Cardiology

## 2020-02-29 ENCOUNTER — Encounter (HOSPITAL_COMMUNITY): Payer: Medicare HMO | Admitting: Cardiology

## 2020-02-29 NOTE — Telephone Encounter (Signed)
Pt wife called said pt is having sharp pains going from neck to arm on the left side, he was told by nurse to go to the ER asap, wife requests call back from nurse

## 2020-02-29 NOTE — Telephone Encounter (Signed)
He is on multiple BP meds.  Have him check his BP daily and call in readings.

## 2020-02-29 NOTE — Telephone Encounter (Signed)
Spoke with patient and his wife. Pt said he does not feel the need to go to the emergency room. Pt states this happens every time he takes hydralazine he gets chest pain and pain on his left side. Pt said its an aching pain that lasts about 2 hours. Pt said he stopped the medication in the past and the pain went away. He is tried taking medication again and the symptoms started. I spoke with Ander Purpura (PharmD) and she said this is a true reaction to hydralazine and agrees that patient should stop the medication. Pt wants to know does he need to take another medication since he is stopping hydralazine.   Routed to Nellie for advice

## 2020-02-29 NOTE — Telephone Encounter (Signed)
Pt aware and agreeable with plan.  

## 2020-03-04 ENCOUNTER — Encounter: Payer: Self-pay | Admitting: Gastroenterology

## 2020-03-14 ENCOUNTER — Ambulatory Visit: Payer: Medicare HMO

## 2020-04-18 ENCOUNTER — Ambulatory Visit: Payer: Medicare HMO

## 2020-04-23 ENCOUNTER — Telehealth: Payer: Self-pay | Admitting: Internal Medicine

## 2020-04-23 NOTE — Telephone Encounter (Signed)
Spoke with pt and for weeks pt  has noted chest pain and heart beating fast with any activity "walking to mailbox.walking to car etc." Per pt can rest and goes away Pt also feels that he has rib pain not  sure if this causing chest pain or if this is 2 separate problems Instructed pt if has worsening S/S to go to ED for eval and tx Pt verbalized understanding. Will forward message to Dr Aundra Dubin for review and recommendations./cy

## 2020-04-23 NOTE — Telephone Encounter (Signed)
Pt c/o of Chest Pain: STAT if CP now or developed within 24 hours  1. Are you having CP right now? No   2. Are you experiencing any other symptoms (ex. SOB, nausea, vomiting, sweating)? No   3. How long have you been experiencing CP? Probably 6 months   4. Is your CP continuous or coming and going? Coming and going   5. Have you taken Nitroglycerin? No   I contacted [name] this morning due to receiving a MyChart message via patient schedule stating "Hello i know you made me a new appointment and i am sorry i missed it i been out of down i done had to deaths in the family and it had done take a toll on me now i am able to make my appointment if you can make me one more in am back in [place] i really now this so i can get back on track for my self when you loss someone you truly love it's hard so please understand my pain but need to see the doctor been having pain for a few week please if you can make me one more appointment please"   [name] states he has been having chest and rib pain for the past 6 months off and on. He states sometimes the pain becomes unbearable, but no other symptoms are present. Before ending the call [name] was also rescheduled with NL pharmD. Please advise.    ?

## 2020-04-23 NOTE — Telephone Encounter (Signed)
Work him in with me one day this week, can be when I'm in hospital late morning tomorrow if needed.

## 2020-04-24 NOTE — Telephone Encounter (Signed)
Spoke with pt-appt scheduled

## 2020-04-26 ENCOUNTER — Ambulatory Visit (HOSPITAL_COMMUNITY)
Admission: RE | Admit: 2020-04-26 | Discharge: 2020-04-26 | Disposition: A | Discharge status: Home / Self Care | Payer: Medicare HMO | Source: Ambulatory Visit | Attending: Cardiology | Admitting: Cardiology

## 2020-04-26 ENCOUNTER — Encounter (HOSPITAL_COMMUNITY): Payer: Self-pay | Admitting: Cardiology

## 2020-04-26 ENCOUNTER — Other Ambulatory Visit: Payer: Self-pay

## 2020-04-26 VITALS — BP 160/90 | HR 88 | Wt 163.0 lb

## 2020-04-26 DIAGNOSIS — E785 Hyperlipidemia, unspecified: Secondary | ICD-10-CM | POA: Diagnosis not present

## 2020-04-26 DIAGNOSIS — I2510 Atherosclerotic heart disease of native coronary artery without angina pectoris: Secondary | ICD-10-CM | POA: Diagnosis not present

## 2020-04-26 DIAGNOSIS — I130 Hypertensive heart and chronic kidney disease with heart failure and stage 1 through stage 4 chronic kidney disease, or unspecified chronic kidney disease: Secondary | ICD-10-CM | POA: Diagnosis not present

## 2020-04-26 DIAGNOSIS — F17210 Nicotine dependence, cigarettes, uncomplicated: Secondary | ICD-10-CM | POA: Diagnosis not present

## 2020-04-26 DIAGNOSIS — Z79899 Other long term (current) drug therapy: Secondary | ICD-10-CM | POA: Diagnosis not present

## 2020-04-26 DIAGNOSIS — I10 Essential (primary) hypertension: Secondary | ICD-10-CM | POA: Diagnosis not present

## 2020-04-26 DIAGNOSIS — Z7982 Long term (current) use of aspirin: Secondary | ICD-10-CM | POA: Insufficient documentation

## 2020-04-26 DIAGNOSIS — I429 Cardiomyopathy, unspecified: Secondary | ICD-10-CM | POA: Insufficient documentation

## 2020-04-26 DIAGNOSIS — K219 Gastro-esophageal reflux disease without esophagitis: Secondary | ICD-10-CM | POA: Diagnosis not present

## 2020-04-26 DIAGNOSIS — Z8673 Personal history of transient ischemic attack (TIA), and cerebral infarction without residual deficits: Secondary | ICD-10-CM | POA: Diagnosis not present

## 2020-04-26 DIAGNOSIS — I25119 Atherosclerotic heart disease of native coronary artery with unspecified angina pectoris: Secondary | ICD-10-CM | POA: Diagnosis not present

## 2020-04-26 DIAGNOSIS — I5042 Chronic combined systolic (congestive) and diastolic (congestive) heart failure: Secondary | ICD-10-CM | POA: Insufficient documentation

## 2020-04-26 DIAGNOSIS — I252 Old myocardial infarction: Secondary | ICD-10-CM | POA: Diagnosis not present

## 2020-04-26 DIAGNOSIS — Z955 Presence of coronary angioplasty implant and graft: Secondary | ICD-10-CM | POA: Diagnosis not present

## 2020-04-26 DIAGNOSIS — R0789 Other chest pain: Secondary | ICD-10-CM | POA: Insufficient documentation

## 2020-04-26 DIAGNOSIS — N184 Chronic kidney disease, stage 4 (severe): Secondary | ICD-10-CM | POA: Insufficient documentation

## 2020-04-26 DIAGNOSIS — Z953 Presence of xenogenic heart valve: Secondary | ICD-10-CM | POA: Diagnosis not present

## 2020-04-26 DIAGNOSIS — I5022 Chronic systolic (congestive) heart failure: Secondary | ICD-10-CM | POA: Diagnosis not present

## 2020-04-26 LAB — BASIC METABOLIC PANEL
Anion gap: 7 (ref 5–15)
BUN: 31 mg/dL — ABNORMAL HIGH (ref 6–20)
CO2: 24 mmol/L (ref 22–32)
Calcium: 9.1 mg/dL (ref 8.9–10.3)
Chloride: 111 mmol/L (ref 98–111)
Creatinine, Ser: 3.46 mg/dL — ABNORMAL HIGH (ref 0.61–1.24)
GFR, Estimated: 21 mL/min — ABNORMAL LOW (ref >60)
Glucose, Bld: 78 mg/dL (ref 70–99)
Potassium: 4.5 mmol/L (ref 3.5–5.1)
Sodium: 142 mmol/L (ref 135–145)

## 2020-04-26 MED ORDER — HYDRALAZINE HCL 25 MG PO TABS: 25.0000 mg | ORAL_TABLET | Freq: Three times a day (TID) | ORAL | 3 refills | Status: DC

## 2020-04-26 NOTE — Patient Instructions (Addendum)
Labs done today. We will contact you only if your labs are abnormal.  START Hydralazine 25mg  (1 tablet) by mouth 3 times daily.  No other medication changes were made. Please continue all current medications as prescribed.  Your physician recommends that you schedule a follow-up appointment in: 3 weeks for an appointment with our Clinic Pharmacist, Lauren and in 6 weeks for an appointment with our [facility].  Your physician has requested that you have a renal artery duplex. During this test, an ultrasound is used to evaluate blood flow to the kidneys. Allow one hour for this exam. Do not eat after midnight the day before and avoid carbonated beverages. Take your medications as you usually do. This has to be approved through insurance prior to scheduling, once approved we will contact you to schedule an appointment.  A chest x-ray takes a picture of the organs and structures inside the chest, including the heart, lungs, and blood vessels. This test can show several things, including, whether the heart is enlarges; whether fluid is building up in the lungs; and whether pacemaker / defibrillator leads are still in place.this can be done today before you leave in Radiology.   If you have any questions or concerns before your next appointment please send [name] a message through [place] or call our office at [phone].    TO LEAVE A MESSAGE FOR THE NURSE SELECT OPTION 2, PLEASE LEAVE A MESSAGE INCLUDING: . YOUR NAME . DATE OF BIRTH . CALL BACK NUMBER . REASON FOR CALL**this is important as we prioritize the call backs  YOU WILL RECEIVE A CALL BACK THE SAME DAY AS LONG AS YOU CALL BEFORE 4:00 PM   Do the following things EVERYDAY: 1) Weigh yourself in the morning before breakfast. Write it down and keep it in a log. 2) Take your medicines as prescribed 3) Eat low salt foods--Limit salt (sodium) to 2000 mg per day.  4) Stay as active as you can everyday 5) Limit all fluids for the day to less  than 2 liters   At the [facility], you and your health needs are our priority. As part of our continuing mission to provide you with exceptional heart care, we have created designated Provider Care Teams. These Care Teams include your primary Cardiologist (physician) and Advanced Practice Providers (APPs- Physician Assistants and Nurse Practitioners) who all work together to provide you with the care you need, when you need it.   You may see any of the following providers on your designated Care Team at your next follow up: [name] Dr Glori Bickers . Dr [name] . Darrick Grinder, NP . Lyda Jester, PA . Audry Riles, PharmD   Please be sure to bring in all your medications bottles to every appointment.

## 2020-04-28 NOTE — Progress Notes (Signed)
Date:  04/28/2020   ID:  [name], DOB January 24, 1971, MRN [id number]   Provider location: [facility] Type of Visit: Established patient   PCP:  Kerin Perna, NP  Cardiologist:  Dr. Aundra Dubin   History of Present Illness: [name] is a 50 y.o. male who has a history of CAD, NSTEMI 11/2017,  smoking, CKD Stage III, HTN, AAA repair 2016 at [facility] , open repair of TAAA secondary to chronic dissection with multibranch dacron graft 12/2016, CVA 2017, loop recorder, bioprothetic AVR 2016, HLD.   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 The descending thoracic aorta has dilated to 5.1 cm. Cardiac catheterization in April 2016 demonstrated 60% first diagonal lesion. He underwent aortic valve replacement with bovine pericardial tissue valve and reconstruction along with aortic root replacement and endovascular repair of the descending thoracic aortic aneurysm at [facility]. He has had intermittent chest discomfort since then.  Admitted in 12/02/2017 chest pain. Had NSTEMI. He had LHC with stent placed proximal ramus.  Repeat cath in 7/20 with nonobstructive disease.   He was admitted in 9/21 with atypical chest pain.  Echo showed EF 60-65% with severe LVH, normal RV, bioprosthetic aortic valve with mean gradient 18 mmHg.  Cardiolite showed no ischemia.   He presents for followup of CAD, HTN.  He still smokes but down to a few cigarettes/day.  Main complaint remains chest pain.  This has been present now for years.  It usually presents at rest, often when he is lying in bed.  It radiates to his upper back.  Chest pain is not worse with exertion.  He also gets GERD symptoms and tastes acid in his mouth.  Protonix does not totally resolve the GERD symptoms.  No exertional dyspnea, orthopnea/PND, palpitations.  BP remains  elevated.  He no longer takes hydralazine, he thinks that it may have caused a headache.   ECG (personally reviewed): NSR, LVH with repolarization abnormality.   Labs (10/19): K 4.1, creatinine 1.78, myeloma panel negative Labs (2/20): K 4.2, creatinine 1.75, LDL 128 Labs (7/21): LDL 66 Labs (9/21): K 4.1, creatinine 3.31, hgb 11.9 Labs (10/21): K 4, creatinine 2.96, hgb 12.8  Review of Systems: All systems reviewed and negative except as per HPI.   PMH: 1. H/o ankle fractures bilaterally 2. GERD 3. HTN: Negative urinary catecholeamine collection for pheochromocytoma.  4. CKD: Stage 3. Suspect hypertensive nephropathy.  5. Cardiomyopathy: Most likely due to heavy ETOH ingestion and uncontrolled HTN. 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 Lexiscan myoview (4/13): EF 37%, global hypokinesis, no ischemic or infarction. Echo (12/14) with EF 45-50%, diffuse hypokinesis, moderate LVH, mild MR, moderate AI. Echo (4/16) with EF 55-60%, severe LVH, mild LV dilation, moderate AI and moderate MR.  - Echo (9/19): EF 35-40% with diffuse hypokinesis, severe LVH, bioprosthetic aortic valve functioning normally.  - PYP scan (1/20): No evidence for TTR amyloidosis.  - Echo (9/21): EF 60-65%, severe LVH, normal RV, bioprosthetic aortic valve with mean gradient 18 mmHg.  6. ETOH abuse: Quit 3/13.  7. Active smoker. 8. Valvular heart disease: Echo in 3/13 showed moderate to severe eccentric MR and mild to moderate AI with prolapsing left coronary cusp.  Echo 12/14 with mild MR and moderate AI. Echo 4/16 with moderate AI and moderate MR.  - S/p Bentall procedure with bioprosthetic aortic valve 9/16.  9.  CVA: Has ILR. 10. Type B aortic dissection: Occurred in 2/16, from just distal to the left subclavian. 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   -  10/03/2014 First stage median sternotomy with right axillary cannulation for aortic valved conduit root replacement with coronary reconstruction (button Bentall Procedure), 27 mm Sorin Mitroflow bovine pericardial valved conduit, total arch replacement (33m Vascutek BSomaliamultibranch arch graft with individual head vessel reimplantation. - Status post open repair of an Extent III TAA secondary to chronic dissection with a # 285mVascutek "Coselli" multibranch Dacron graft with individual celiac, SMA, and bilateral renal artery implantationon 01/04/2017, [facility].  11. CAD: LHC (4/16) with 60% ostial D1.  - NSTEMI 9/19: LHC (9/19) showed 50% proximal LCx, 90% ramus => DES to ramus.  - LHC (7/20): D1 60% stenosis, patent ramus stent.  - Cardiolite (9/21): Inferior fixed defect, no ischemia.  12. Sleep study (10/21) with no significant OSA.   Current Outpatient Medications  Medication Sig Dispense Refill  . acetaminophen (TYLENOL) 325 MG tablet Take 650 mg by mouth every 6 (six) hours as needed (for pain or headaches).     . Marland Kitchenlbuterol (VENTOLIN HFA) 108 (90 Base) MCG/ACT inhaler Inhale into the lungs every 6 (six) hours as needed for wheezing or shortness of breath.    . Marland Kitchenmitriptyline (ELAVIL) 50 MG tablet Take 50 mg by mouth at bedtime.    . Marland KitchenmLODipine (NORVASC) 10 MG tablet Take 10 mg by mouth daily.    . Marland Kitchenspirin EC 81 MG EC tablet Take 1 tablet (81 mg total) by mouth daily. 90 tablet 3  . Blood Pressure Monitor KIT 1 Device by Does not apply route 2 (two) times daily. 1 kit 0  . carvedilol (COREG) 25 MG tablet Take 25 mg by mouth 2 (two) times daily with a meal.    . cloNIDine (CATAPRES - DOSED IN MG/24 HR) 0.3 mg/24hr patch Place 1 patch (0.3 mg total) onto the skin once a week. 4 patch 6  . doxazosin (CARDURA) 8 MG tablet Take 2 tablets (16 mg total) by mouth at bedtime. 180 tablet 1  . empagliflozin (JARDIANCE) 10 MG TABS tablet Take 1 tablet (10 mg total) by mouth daily. 30 tablet 11  .  Evolocumab (REPATHA SURECLICK) 14409G/ML SOAJ Inject 1 Dose into the skin every 14 (fourteen) days. 2 mL 11  . hydrALAZINE (APRESOLINE) 25 MG tablet Take 1 tablet (25 mg total) by mouth 3 (three) times daily. 270 tablet 3  . HYDROcodone-acetaminophen (NORCO/VICODIN) 5-325 MG tablet Take 1-2 tablets by mouth every 6 (six) hours as needed for severe pain. 8 tablet 0  . minoxidil (LONITEN) 10 MG tablet TAKE 2 TABLETS(20 MG) BY MOUTH TWICE DAILY 90 tablet 3  . pantoprazole (PROTONIX) 40 MG tablet Take 1 tablet (40 mg total) by mouth daily. 30 tablet 6  . STIOLTO RESPIMAT 2.5-2.5 MCG/ACT AERS INHALE 2 PUFFS INTO THE LUNGS DAILY 4 g 5   No current facility-administered medications for this encounter.    Allergies:   Imdur [isosorbide nitrate] and Tramadol   Social History:  The patient  reports that he has been smoking cigarettes. He has a 5.75 pack-year smoking history. He has never used smokeless tobacco. He reports current alcohol use of about 1.0 standard drink of alcohol per week. He reports current drug  use. Drug: Marijuana.   Family History:  The patient's family history includes Colon cancer in his paternal uncle; Diabetes in his maternal aunt; Headache in his sister; Heart attack in his brother; Hypertension in his brother, mother, and another family member; Lung cancer in his maternal uncle; Other in his sister; Stroke in his maternal aunt and maternal uncle; Thyroid disease in his sister.   ROS:  Please see the history of present illness.   All other systems are personally reviewed and negative.   Exam:   BP (!) 160/90   Pulse 88   Wt 73.9 kg (163 lb)   SpO2 98%   BMI 22.73 kg/m  General: NAD Neck: No JVD, no thyromegaly or thyroid nodule.  Lungs: Clear to auscultation bilaterally with normal respiratory effort. CV: Nondisplaced PMI.  Heart regular S1/S2, no S3/S4, 2/6 SEM RUSB.  No peripheral edema.  No carotid bruit.  Normal pedal pulses.  Abdomen: Soft, nontender, no  hepatosplenomegaly, no distention.  Skin: Intact without lesions or rashes.  Neurologic: Alert and oriented x 3.  Psych: Normal affect. Extremities: No clubbing or cyanosis.  HEENT: Normal.   Recent Labs: 11/02/2019: ALT 9; TSH 1.680 11/26/2019: Hemoglobin 12.8; Platelets 107 04/26/2020: BUN 31; Creatinine, Ser 3.46; Potassium 4.5; Sodium 142  Personally reviewed   Wt Readings from Last 3 Encounters:  04/26/20 73.9 kg (163 lb)  02/16/20 73.9 kg (163 lb)  02/15/20 73.9 kg (163 lb)      ASSESSMENT AND PLAN:  1. CAD: 9/19 NSTEMI with PCI to proximal ramus.  LHC in 7/20 with no obstructive disease.  Recent admission in 9/21 with Cardiolite showing no ischemia.  He has long-standing primarily non-exertional chest pain.  I suspect that it may be musculoskeletal and related to his prior surgeries.  GERD seems to be a contributor to his chest pain.  ECG unchanged.  - Refer to GI as PPI does not totally resolve GERD-type symptoms.  - Continue ASA 81.  - He is on Repatha via the lipid clinic.  - With long-standing chest and upper back pain, will get CXR PA/lateral.  2. 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 Myeloma panel negative and PYP scan negative, suspect due more likely to HTN than hypertrophic cardiomyopathy.  He is not volume overloaded on exam and is not using Lasix.  Cannot get cardiac MRI with elevated creatinine.  Most recent echo in 9/21 showed EF up to 60-65%, severe LVH, normal RV.  NYHA class I-II symptoms.  - Continue Coreg 25 mg bid.  - He is off spironolactone and Entresto with CKD stage IV.  3. HTN: Still needs better control.  Sleep study in 10/21 was negative.  - Continue amlodipine 10 mg daily, minoxidil, Coreg, and clonidine.  - Start back on hydralazine 25 mg tid. He does not have other good options for antihypertensives.  - Check renal artery dopplers to rule out renal artery stenosis.  4. S/P  Bioprothetic AVR: Echo in 9/21 with mildly elevated mean gradient (18 mmHg).  5. S/P 2016 and 2018 thoracic aortic aneurysm repair: Followed by Dr. [name] at [facility].  6. CKD Stage IIIb-IV: Creatinine down to 2.96 in 10/21.  - BMET today.  - Has followup with nephrology.  7. Active smoker: He is cutting back, again recommended cessation.   Followup in 3 wks with HF pharmacist for medication titration/BP control, see NP/PA in 6 wks.   Signed, [name], MD  04/28/2020  [address] [address] [phone] (office) [phone] (fax)

## 2020-04-30 ENCOUNTER — Ambulatory Visit (INDEPENDENT_AMBULATORY_CARE_PROVIDER_SITE_OTHER): Payer: Medicare HMO | Admitting: Pharmacist

## 2020-04-30 ENCOUNTER — Other Ambulatory Visit: Payer: Self-pay

## 2020-04-30 DIAGNOSIS — I10 Essential (primary) hypertension: Secondary | ICD-10-CM

## 2020-04-30 NOTE — Progress Notes (Deleted)
HPI:  [name] is a 50 y.o. male patient of Dr Oval Linsey, with a PMH below who presents today for advanced hypertension clinic follow up.  He was seen by Dr. Oval Linsey in mid-August with a blood pressure of 182/100.  There was concern at that time about compliance with medications, and he was given instructions to simplify his medication regimen to three times daily.  After that visit he was admitted to [facility] for an episode of chest pain with uncontrolled hypertension.  At a follow up with Dr. Aundra Dubin his hydralazine was increased from 25 mg tid to 50 mg tid, and a sleep study was ordered.    He returns today for follow up.  He has been working on some lifestyle modifications, including trying to decrease his caffeine intake and stop smoking.  Apparently waiting for insurance to send nicotine replacement patches.  Occasionally will miss mid-day dose of hydralazine, but states takes all doses most days.  States still has problems after cracked ribs during aortic dissection surgery, this keeps him from being as active as he would like with his grandchildren.    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  CVA 02/2016  CKD 9/21: SCr 3.31, GFR 21; Left renal infarct after aortic dissection in 2016  OSA Snores, sleep study ordered     Blood Pressure Goal:  130/80  Current Medications: Amlodipine 56m daily Carvedilol 234mtwice daily Clonidine patch 0.44m38meekly Doxazosin 41m61m bedtime Hydralazine 25mg41mee times daily Minoxidil 20mg 51me daily  Family Hx: father, brother and sister all with hypertension; (siblings younger than pt); maternal uncle with MI at 64; 6 31ds58 34 yr 65n with hypertension,   Social Hx:  Trying to quit, [employer] to send patches, down to 7 cigarettes per day; occasional alcohol; 2 coffee per week , soda biw (Pepsi)  Diet:   Home cooked, using Ms. Dash; Karlene Einsteines, fresh;   Exercise: no formal exercise, just with grandkids,  Continues to have discomfort from cracked ribs (post surgery)   Home BP readings:  Using apple watch, not accurate; has home cuff but no readings with him today  Intolerances: isosorbide and  higher doses of hydralazine - headaches  Labs:  9/21:  Na 139, K 4.1, Glu 97, BUN 24, Scr 3.31, GFR 21  Wt Readings from Last 3 Encounters:  04/26/20 163 lb (73.9 kg)  02/16/20 163 lb (73.9 kg)  02/15/20 163 lb (73.9 kg)   BP Readings from Last 3 Encounters:  04/26/20 (!) 160/90  02/16/20 (!) 155/86  02/15/20 (!) 161/103   Pulse Readings from Last 3 Encounters:  04/26/20 88  02/16/20 67  02/15/20 (!) 113    Current Outpatient Medications  Medication Sig Dispense Refill  . acetaminophen (TYLENOL) 325 MG tablet Take 650 mg by mouth every 6 (six) hours as needed (for pain or headaches).     . albuMarland Kitchenerol (VENTOLIN HFA) 108 (90 Base) MCG/ACT inhaler Inhale into the lungs every 6 (six) hours as needed for wheezing or shortness of breath.    . amitMarland Kitcheniptyline (ELAVIL) 50 MG tablet Take 50 mg by mouth at bedtime.    . amLOMarland Kitchenipine (NORVASC) 10 MG tablet Take 10 mg by mouth daily.    . aspiMarland Kitchenin EC 81 MG EC tablet Take 1 tablet (81 mg total) by mouth daily.  90 tablet 3  . Blood Pressure Monitor KIT 1 Device by Does not apply route 2 (two) times daily. 1 kit 0  . carvedilol (COREG) 25 MG tablet Take 25 mg by mouth 2 (two) times daily with a meal.    . cloNIDine (CATAPRES - DOSED IN MG/24 HR) 0.3 mg/24hr patch Place 1 patch (0.3 mg total) onto the skin once a week. 4 patch 6  . doxazosin (CARDURA) 8 MG tablet Take 2 tablets (16 mg total) by mouth at bedtime. 180 tablet 1  . empagliflozin (JARDIANCE) 10 MG TABS tablet Take 1 tablet (10 mg total) by mouth daily. 30 tablet 11  . Evolocumab (REPATHA SURECLICK) 417 MG/ML SOAJ Inject 1 Dose into the skin every 14 (fourteen) days. 2 mL 11  . hydrALAZINE  (APRESOLINE) 25 MG tablet Take 1 tablet (25 mg total) by mouth 3 (three) times daily. 270 tablet 3  . HYDROcodone-acetaminophen (NORCO/VICODIN) 5-325 MG tablet Take 1-2 tablets by mouth every 6 (six) hours as needed for severe pain. 8 tablet 0  . minoxidil (LONITEN) 10 MG tablet TAKE 2 TABLETS(20 MG) BY MOUTH TWICE DAILY 90 tablet 3  . pantoprazole (PROTONIX) 40 MG tablet Take 1 tablet (40 mg total) by mouth daily. 30 tablet 6  . STIOLTO RESPIMAT 2.5-2.5 MCG/ACT AERS INHALE 2 PUFFS INTO THE LUNGS DAILY 4 g 5   No current facility-administered medications for this visit.    Allergies  Allergen Reactions  . Imdur [Isosorbide Nitrate] Other (See Comments)    Headaches   . Tramadol Other (See Comments)    Headaches     Past Medical History:  Diagnosis Date  . Adenomatous colon polyp 08/2015  . Anxiety   . Aortic disease ([place])   . Aortic dissection ([facility])    a. admx 04/2014 >> L renal infarct; a/c renal failure >> b.  s/p Bioprosthetic Bentall and total arch replacement and staged endovascular repair of descending aortic aneurysm ([facility] - Dr. [name])  . CAD (coronary artery disease)    a. LHC 4/16:  oD1 60%  . Cardiomyopathy ([place])    a. non-ischemic - probably related to untreated HTN and ETOH abuse - Echo 3/13 with EF 35-40% >> b. Echo 4/16: Severe LVH, EF 55-60%, moderate AI, moderate MR, mild LAE, trivial effusion, known type B dissection with communication between true and false lumens with suprasternal images suggesting dissection plane may propagate to at least left subclavian takeoff, root above aortic valve okay    . Chronic abdominal pain   . Chronic combined systolic and diastolic congestive heart failure ([place])    a. 05/2011: Adm with pulm edema/HTN urgency, EF 35-40% with diffuse hypokinesis and moderate to severe mitral regurgitation. Cardiomyopathy likely due to uncontrolled HTN and ETOH abuse - cath deferred due to renal insufficiency (felt due to uncontrolled HTN). bJodie Echevaria MV  06/2011: EF 37% and no ischemia or infarction. c. EF 45-50% by echo 01/2012.  [name] Chronic sinusitis   . CKD (chronic kidney disease)    a. 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  peak creatinine 3.46 during admx for aortic dissection 2/16.  sees Dr [name]  . DDD (degenerative disc disease), lumbar   . Depression   . Descending thoracic aortic aneurysm ([place])   . Dissecting aneurysm of thoracic aorta ([place])   . ETOH abuse    a. Reported to have quit 05/2011.  . Frequent headaches   . GERD (gastroesophageal reflux disease)   . Headache(784.0)    "q other day" (08/08/2013)  .  Heart murmur   . Hemorrhoid thrombosis   . History of echocardiogram    Echo 1/17:  Severe LVH, EF 55-60%, no RWMA, Gr 2 DD, AVR ok, mild to mod MR, mild LAE, mild reduced RVSF, mod RAE  . History of medication noncompliance   . HYPERLIPIDEMIA   . Hypertension    a. Hx of HTN urgency secondary to noncompliance. b. urinary metanephrine and catecholeamine levels normal 2013.  c. Renal art [name] 1/16:  No evidence of renal artery stenosis noted bilaterally.  . INGUINAL HERNIA   . Pneumonia ~ 2013  . Renal insufficiency   . Serrated adenoma of colon 08/2015  . Stroke ([place])   . Tobacco abuse   . Valvular heart disease    a. Echo 05/2011: moderate to severe eccentric MR and mild to moderate AI with prolapsing left coronary cusp. b. Echo 01/2012: mild-mod AI, mild dilitation of aortic root, mild MR.;  c. Echo 1/16: Severe LVH consistent with hypertrophic cardio myopathy, EF 50%, no RWMA, mod AI, mild MR, mild RAE, dilated Ao root (40 mm);     There were no vitals taken for this visit.  No problem-specific Assessment & Plan notes found for this encounter.   Shawntez Dickison Rodriguez-Guzman PharmD, BCPS, [place] [id number] 04/30/2020 1:51 PM

## 2020-04-30 NOTE — Patient Instructions (Addendum)
Return for a  follow up appointment   Go to the lab in   Check your blood pressure at home daily (if able) and keep record of the readings.  Take your BP meds as follows:  Bring all of your meds, your BP cuff and your record of home blood pressures to your next appointment.  Exercise as you're able, try to walk approximately 30 minutes per day.  Keep salt intake to a minimum, especially watch canned and prepared boxed foods.  Eat more fresh fruits and vegetables and fewer canned items.  Avoid eating in fast food restaurants.    HOW TO TAKE YOUR BLOOD PRESSURE: . Rest 5 minutes before taking your blood pressure. .  Don't smoke or drink caffeinated beverages for at least 30 minutes before. . Take your blood pressure before (not after) you eat. . Sit comfortably with your back supported and both feet on the floor (don't cross your legs). . Elevate your arm to heart level on a table or a desk. . Use the proper sized cuff. It should fit smoothly and snugly around your bare upper arm. There should be enough room to slip a fingertip under the cuff. The bottom edge of the cuff should be 1 inch above the crease of the elbow. . Ideally, take 3 measurements at one sitting and record the average.

## 2020-05-03 NOTE — Progress Notes (Signed)
Patient sowed up 3 hours early for this appointement. Left office when told we had to wait until OV (patient present) completed. Never returned to complete appointment

## 2020-05-14 ENCOUNTER — Other Ambulatory Visit: Payer: Self-pay

## 2020-05-14 ENCOUNTER — Other Ambulatory Visit (INDEPENDENT_AMBULATORY_CARE_PROVIDER_SITE_OTHER): Payer: Medicare HMO | Admitting: *Deleted

## 2020-05-14 ENCOUNTER — Ambulatory Visit (HOSPITAL_COMMUNITY)
Admission: RE | Admit: 2020-05-14 | Discharge: 2020-05-14 | Disposition: A | Discharge status: Home / Self Care | Payer: Medicare HMO | Source: Ambulatory Visit | Attending: Cardiology | Admitting: Cardiology

## 2020-05-14 ENCOUNTER — Encounter: Payer: Self-pay | Admitting: Cardiology

## 2020-05-14 DIAGNOSIS — I10 Essential (primary) hypertension: Secondary | ICD-10-CM

## 2020-05-14 DIAGNOSIS — R079 Chest pain, unspecified: Secondary | ICD-10-CM | POA: Diagnosis not present

## 2020-05-14 NOTE — Progress Notes (Signed)
Patient presented for renal artery duplex today but reported chest pain.  He has a 50 y.o. male who has a history of CAD, NSTEMI 11/2017, smoking, CKD Stage III, HTN, AAA repair 2016at [facility] , open repair of TAAA secondary to chronic dissection with multibranch dacrongraft 12/2016, CVA 2017, loop recorder, bioprothetic AVR 2016, HLD.  Has history of chronic chest pain, thought to be MSK per most recent note from Dr. Marigene Ehlers.  He reported acute worsening of chest pain this morning, states it is felt more intense than his chronic chest pain.  EKG was checked and shows sinus rhythm, rate 84, LVH with repolarization abnormalities, PVC.  No changes in EKG from prior.  BP 175/90.  Given his worsening chest pain and considering his history, recommended evaluation in the ED to rule out MI.  He was agreeable to this plan.

## 2020-05-15 ENCOUNTER — Telehealth (HOSPITAL_COMMUNITY): Payer: Self-pay

## 2020-05-15 ENCOUNTER — Encounter (HOSPITAL_COMMUNITY): Payer: Self-pay | Admitting: Emergency Medicine

## 2020-05-15 ENCOUNTER — Emergency Department (HOSPITAL_COMMUNITY): Payer: Medicare HMO

## 2020-05-15 ENCOUNTER — Emergency Department (HOSPITAL_COMMUNITY)
Admission: EM | Admit: 2020-05-15 | Discharge: 2020-05-15 | Disposition: A | Discharge status: Home / Self Care | Payer: Medicare HMO | Attending: Emergency Medicine | Admitting: Emergency Medicine

## 2020-05-15 DIAGNOSIS — Z79899 Other long term (current) drug therapy: Secondary | ICD-10-CM | POA: Diagnosis not present

## 2020-05-15 DIAGNOSIS — I2510 Atherosclerotic heart disease of native coronary artery without angina pectoris: Secondary | ICD-10-CM | POA: Diagnosis not present

## 2020-05-15 DIAGNOSIS — I5022 Chronic systolic (congestive) heart failure: Secondary | ICD-10-CM | POA: Diagnosis not present

## 2020-05-15 DIAGNOSIS — F17210 Nicotine dependence, cigarettes, uncomplicated: Secondary | ICD-10-CM | POA: Insufficient documentation

## 2020-05-15 DIAGNOSIS — N184 Chronic kidney disease, stage 4 (severe): Secondary | ICD-10-CM | POA: Diagnosis not present

## 2020-05-15 DIAGNOSIS — Z8673 Personal history of transient ischemic attack (TIA), and cerebral infarction without residual deficits: Secondary | ICD-10-CM | POA: Diagnosis not present

## 2020-05-15 DIAGNOSIS — K219 Gastro-esophageal reflux disease without esophagitis: Secondary | ICD-10-CM | POA: Insufficient documentation

## 2020-05-15 DIAGNOSIS — R0789 Other chest pain: Secondary | ICD-10-CM | POA: Diagnosis not present

## 2020-05-15 DIAGNOSIS — Z7982 Long term (current) use of aspirin: Secondary | ICD-10-CM | POA: Insufficient documentation

## 2020-05-15 DIAGNOSIS — I130 Hypertensive heart and chronic kidney disease with heart failure and stage 1 through stage 4 chronic kidney disease, or unspecified chronic kidney disease: Secondary | ICD-10-CM | POA: Diagnosis not present

## 2020-05-15 DIAGNOSIS — R079 Chest pain, unspecified: Secondary | ICD-10-CM

## 2020-05-15 LAB — BASIC METABOLIC PANEL
Anion gap: 9 (ref 5–15)
BUN: 30 mg/dL — ABNORMAL HIGH (ref 6–20)
CO2: 20 mmol/L — ABNORMAL LOW (ref 22–32)
Calcium: 8.9 mg/dL (ref 8.9–10.3)
Chloride: 111 mmol/L (ref 98–111)
Creatinine, Ser: 3.9 mg/dL — ABNORMAL HIGH (ref 0.61–1.24)
GFR, Estimated: 18 mL/min — ABNORMAL LOW (ref >60)
Glucose, Bld: 97 mg/dL (ref 70–99)
Potassium: 4 mmol/L (ref 3.5–5.1)
Sodium: 140 mmol/L (ref 135–145)

## 2020-05-15 LAB — CBC
HCT: 37.8 % — ABNORMAL LOW (ref 39.0–52.0)
Hemoglobin: 12.7 g/dL — ABNORMAL LOW (ref 13.0–17.0)
MCH: 32.1 pg (ref 26.0–34.0)
MCHC: 33.6 g/dL (ref 30.0–36.0)
MCV: 95.5 fL (ref 80.0–100.0)
Platelets: 106 10*3/uL — ABNORMAL LOW (ref 150–400)
RBC: 3.96 MIL/uL — ABNORMAL LOW (ref 4.22–5.81)
RDW: 13.6 % (ref 11.5–15.5)
WBC: 5.4 10*3/uL (ref 4.0–10.5)
nRBC: 0 % (ref 0.0–0.2)

## 2020-05-15 LAB — TROPONIN I (HIGH SENSITIVITY)
Troponin I (High Sensitivity): 52 ng/L — ABNORMAL HIGH (ref <18)
Troponin I (High Sensitivity): 65 ng/L — ABNORMAL HIGH (ref <18)

## 2020-05-15 MED ORDER — MORPHINE SULFATE (PF) 4 MG/ML IV SOLN
4.0000 mg | Freq: Once | INTRAVENOUS | Status: AC
Start: 2020-05-15 — End: 2020-05-15
  Administered 2020-05-15: 4 mg via INTRAVENOUS
  Filled 2020-05-15: qty 1

## 2020-05-15 MED ORDER — AMLODIPINE BESYLATE 5 MG PO TABS
10.0000 mg | ORAL_TABLET | Freq: Once | ORAL | Status: AC
  Administered 2020-05-15: 10 mg via ORAL
  Filled 2020-05-15: qty 2

## 2020-05-15 MED ORDER — ONDANSETRON HCL 4 MG/2ML IJ SOLN
4.0000 mg | Freq: Once | INTRAMUSCULAR | Status: AC
  Administered 2020-05-15: 4 mg via INTRAVENOUS
  Filled 2020-05-15: qty 2

## 2020-05-15 MED ORDER — HYDRALAZINE HCL 25 MG PO TABS
25.0000 mg | ORAL_TABLET | Freq: Once | ORAL | Status: AC
  Administered 2020-05-15: 25 mg via ORAL
  Filled 2020-05-15: qty 1

## 2020-05-15 NOTE — ED Triage Notes (Signed)
Patient complains of chest pain that radiates into left arm that started a few days ago, states pain feels like when he had an MI a few years ago. Patient also reports nausea and persistent headache. Patient alert, oriented, and in no apparent distress at this time.

## 2020-05-15 NOTE — ED Provider Notes (Incomplete)
[facility] EMERGENCY DEPARTMENT Provider Note   CSN: [id number] Arrival date & time: 05/15/20  1416     History Chief Complaint  Patient presents with  . Chest Pain    [name] is a 50 y.o. male.  HPI      [name] is a 50 y.o. male, with a history of anxiety, aortic dissection, CAD, cardiomyopathy, CKD, GERD, heart murmur, HTN, hyperlipidemia, presenting to the ED with chest pain for at least the last week. He describes it as an aching in the left lower chest, most of the time consistent with his baseline chronic chest discomfort, but seems to be worse over the last couple days. Denies fever/chills, cough, shortness of breath, other abdominal pain, other chest pain, N/V/D, syncope, dizziness, urinary symptoms, or any other complaints.    Past Medical History:  Diagnosis Date  . Adenomatous colon polyp 08/2015  . Anxiety   . Aortic disease ([place])   . Aortic dissection (HCC)    a. admx 04/2014 >> L renal infarct; a/c renal failure >> b.  s/p Bioprosthetic [place] and total arch replacement and staged endovascular repair of descending aortic aneurysm ([facility] - Dr. [name])  . CAD (coronary artery disease)    a. LHC 4/16:  oD1 60%  . Cardiomyopathy ([place])    a. non-ischemic - probably related to untreated HTN and ETOH abuse - Echo 3/13 with EF 35-40% >> b. Echo 4/16: Severe LVH, EF 55-60%, moderate AI, moderate MR, mild LAE, trivial effusion, known type B dissection with communication between true and false lumens with suprasternal images suggesting dissection plane may propagate to at least left subclavian takeoff, root above aortic valve okay    . Chronic abdominal pain   . Chronic combined systolic and diastolic congestive heart failure ([place])    a. 05/2011: Adm with pulm edema/HTN urgency, EF 35-40% with diffuse hypokinesis and moderate to severe mitral regurgitation. Cardiomyopathy likely due to uncontrolled HTN and ETOH abuse - cath deferred due to renal  insufficiency (felt due to uncontrolled HTN). bJodie Echevaria MV 06/2011: EF 37% and no ischemia or infarction. c. EF 45-50% by echo 01/2012.  [name] Chronic sinusitis   . CKD (chronic kidney disease)    a. 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  peak creatinine 3.46 during admx for aortic dissection 2/16.  sees Dr [name]  . DDD (degenerative disc disease), lumbar   . Depression   . Descending thoracic aortic aneurysm ([place])   . Dissecting aneurysm of thoracic aorta ([place])   . ETOH abuse    a. Reported to have quit 05/2011.  . Frequent headaches   . GERD (gastroesophageal reflux disease)   . Headache(784.0)    "q other day" (08/08/2013)  . Heart murmur   . Hemorrhoid thrombosis   . History of echocardiogram    Echo 1/17:  Severe LVH, EF 55-60%, no RWMA, Gr 2 DD, AVR ok, mild to mod MR, mild LAE, mild reduced RVSF, mod RAE  . History of medication noncompliance   . HYPERLIPIDEMIA   . Hypertension    a. Hx of HTN urgency secondary to noncompliance. b. urinary metanephrine and catecholeamine levels normal 2013.  c. Renal art [name] 1/16:  No evidence of renal artery stenosis noted bilaterally.  . INGUINAL HERNIA   . Pneumonia ~ 2013  . Renal insufficiency   . Serrated adenoma of colon 08/2015  . Stroke ([place])   . Tobacco abuse   . Valvular heart disease    a.  Echo 05/2011: moderate to severe eccentric MR and mild to moderate AI with prolapsing left coronary cusp. b. Echo 01/2012: mild-mod AI, mild dilitation of aortic root, mild MR.;  c. Echo 1/16: Severe LVH consistent with hypertrophic cardio myopathy, EF 50%, no RWMA, mod AI, mild MR, mild RAE, dilated Ao root (40 mm);     Patient Active Problem List   Diagnosis Date Noted  . History of loop recorder 02/13/2020  . Chest pain 11/24/2019  . COPD (chronic obstructive pulmonary disease) ([place]) 11/24/2019  . Nonspecific chest pain   . Chest pain, rule out acute myocardial infarction 09/24/2018  . Chronic systolic (congestive) heart failure ([place]) 04/25/2018  .  Acute on chronic combined systolic and diastolic CHF (congestive heart failure) ([place]) 12/08/2017  . Coronary artery disease involving native coronary artery of native heart with angina pectoris ([place]) 12/06/2017  . NSTEMI (non-ST elevated myocardial infarction) ([place]) 12/02/2017  . Hypertensive emergency - Accelerated Hypertension 12/02/2017  . History of stroke 10/05/2017  . Somnolence, daytime 04/07/2017  . Snoring 04/07/2017  . Cerebral infarction due to embolism of left middle cerebral artery ([place]) 06/03/2016  . History of aortic dissection 06/03/2016  . Palpitation   . Cerebrovascular accident (CVA) due to embolism of left middle cerebral artery ([place])   . Seizures ([place]) 02/28/2016  . HLD (hyperlipidemia) 08/28/2015  . 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[place]) 06/07/2015  . Cardiomyopathy ([place]) 03/01/2015  . Acid reflux 03/01/2015  . Essential hypertension 03/01/2015  . S/P aortic dissection repair 10/03/2014  . H/O aortic valve replacement 10/03/2014  . CKD (chronic kidney disease), stage IV ([place]) 05/15/2014  . AKI (acute kidney injury) ([place])   . Dissecting aneurysm of thoracic aorta, Stanford type B ([place]) 04/24/2014  . Aortic dissection ([place]) 04/23/2014  . Aneurysm of ascending aorta (HCC) 03/28/2014  . Dyspnea 03/27/2014  . ETOH abuse 09/20/2013  . Malignant hypertension 02/20/2013  . Hypertensive urgency 01/27/2012  . Cardiomyopathy, hypertensive ([place]) 01/26/2012  . AI (aortic insufficiency) 01/26/2012  . MR (mitral regurgitation) 01/26/2012  . Acute renal failure superimposed on stage 3 chronic kidney disease ([place]) 01/26/2012  . Hypertensive cardiomyopathy, without heart failure ([place]): EF reduced to 35-40% from 55%. 06/10/2011  . Chronic bronchitis 05/23/2011  . Cardiomegaly - hypertensive 05/22/2011  . Marijuana use 05/22/2011  . Abdominal pain 05/22/2011  . Hypertension, malignant 05/22/2011  . Anxiety and depression 05/22/2011  . Hyperlipidemia LDL goal <70  08/26/2009  . Tobacco use 08/26/2009  . Headache(784.0) 08/26/2009  . Aortic valve disorder 03/11/2009  . INGUINAL HERNIA 02/18/2009  . Uncontrolled hypertension 01/16/2009    Past Surgical History:  Procedure Laterality Date  . ANKLE SURGERY Bilateral    Fractures bilaterally  . AORTIC VALVE SURGERY  09/2014  . CORONARY ANGIOGRAPHY N/A 12/06/2017   Procedure: CORONARY ANGIOGRAPHY;  Surgeon: Belva Crome, MD;  Location: [facility];  Service: Cardiovascular;  Laterality: N/A;  . CORONARY ANGIOGRAPHY N/A 09/26/2018   Procedure: CORONARY ANGIOGRAPHY;  Surgeon: Nelva Bush, MD;  Location: [facility];  Service: Cardiovascular;  Laterality: N/A;  . CORONARY STENT INTERVENTION N/A 12/10/2017   Procedure: CORONARY STENT INTERVENTION;  Surgeon: [name] Sine, MD;  Location: [facility];  Service: Cardiovascular;  Laterality: N/A;  . FOOT FRACTURE SURGERY Bilateral 2004-2010   "got pins in both of them"  . HEMORRHOID SURGERY N/A 06/15/2015   Procedure: HEMORRHOIDECTOMY;  Surgeon: Stark Klein, MD;  Location: [facility];  Service: General;  Laterality: N/A;  . INGUINAL HERNIA  REPAIR Right ~ 1996  . LEFT HEART CATHETERIZATION WITH CORONARY ANGIOGRAM N/A 06/21/2014   Procedure: LEFT HEART CATHETERIZATION WITH CORONARY ANGIOGRAM;  Surgeon: Larey Dresser, MD;  Location[facility] C[facility] LAB;  Service: Cardiovascular;  Laterality: N/A;  . LOOP RECORDER INSERTION N/A 06/19/2016   Procedure: Loop Recorder Insertion;  Surgeon: Deboraha Sprang, MD;  Location: [facility] CV LAB;  Service: Cardiovascular;  Laterality: N/A;  . TEE WITHOUT CARDIOVERSION N/A 03/12/2016   Procedure: TRANSESOPHAGEAL ECHOCARDIOGRAM (TEE);  Surgeon: Sanda Klein, MD;  Location: [facility];  Service: Cardiovascular;  Laterality: N/A;       Family History  Problem Relation Age of Onset  . Hypertension Mother   . Hypertension Other   . Colon cancer Paternal Uncle   . Stroke Maternal Aunt   . Heart attack  Brother   . Hypertension Brother   . Diabetes Maternal Aunt   . Lung cancer Maternal Uncle   . Stroke Maternal Uncle   . Other Sister        "breathing machine at night"  . Headache Sister   . Thyroid disease Sister   . Throat cancer Neg Hx   . Pancreatic cancer Neg Hx   . Esophageal cancer Neg Hx   . Kidney disease Neg Hx   . Liver disease Neg Hx     Social History   Tobacco Use  . Smoking status: Current Some Day Smoker    Packs/day: 0.25    Years: 23.00    Pack years: 5.75    Types: Cigarettes  . Smokeless tobacco: Never Used  . Tobacco comment: 3 to 4 per day  Substance Use Topics  . Alcohol use: Yes    Alcohol/week: 1.0 standard drink    Types: 1 Cans of beer per week    Comment: occasionally  . Drug use: Yes    Types: Marijuana    Comment: 2 times/week    Home Medications Prior to Admission medications   Medication Sig Start Date End Date Taking? Authorizing Provider  acetaminophen (TYLENOL) 325 MG tablet Take 650 mg by mouth every 6 (six) hours as needed for moderate pain (for pain or headaches).   Yes [provider]  albuterol (VENTOLIN HFA) 108 (90 Base) MCG/ACT inhaler Inhale 2 puffs into the lungs every 6 (six) hours as needed for wheezing or shortness of breath.   Yes [provider]  amitriptyline (ELAVIL) 50 MG tablet Take 50 mg by mouth at bedtime.   Yes [provider]  amLODipine (NORVASC) 10 MG tablet Take 10 mg by mouth daily.   Yes [provider]  aspirin EC 81 MG EC tablet Take 1 tablet (81 mg total) by mouth daily. 12/12/17  Yes [facility], Tami Lin, PA  carvedilol (COREG) 25 MG tablet Take 25 mg by mouth 2 (two) times daily with a meal.   Yes [provider]  cloNIDine (CATAPRES - DOSED IN MG/24 HR) 0.3 mg/24hr patch Place 1 patch (0.3 mg total) onto the skin once a week. 09/28/18  Yes Arrien, Jimmy Picket, MD  doxazosin (CARDURA) 8 MG tablet Take 2 tablets (16 mg total) by mouth at bedtime. Patient  taking differently: Take 8 mg by mouth at bedtime. 12/07/19  Yes Skeet Latch, MD  empagliflozin (JARDIANCE) 10 MG TABS tablet Take 1 tablet (10 mg total) by mouth daily. 12/25/19  Yes Larey Dresser, MD  Evolocumab (REPATHA SURECLICK) 474 MG/ML SOAJ Inject 1 Dose into the skin every 14 (fourteen) days. Patient taking  differently: Inject 140 mg into the skin every 14 (fourteen) days. 11/29/19  Yes Hilty, Nadean Corwin, MD  hydrALAZINE (APRESOLINE) 25 MG tablet Take 1 tablet (25 mg total) by mouth 3 (three) times daily. Patient taking differently: Take 25 mg by mouth 2 (two) times daily. 04/26/20 04/26/21 Yes Larey Dresser, MD  HYDROcodone-acetaminophen (NORCO/VICODIN) 5-325 MG tablet Take 1-2 tablets by mouth every 6 (six) hours as needed for severe pain. 02/10/20  Yes Wieters, Hallie C, PA-C  minoxidil (LONITEN) 10 MG tablet TAKE 2 TABLETS(20 MG) BY MOUTH TWICE DAILY Patient taking differently: Take 10 mg by mouth 2 (two) times daily. 01/12/20  Yes Skeet Latch, MD  OVER THE COUNTER MEDICATION Place 1 drop into the left eye daily. Stye Eye Drop   Yes [provider]  pantoprazole (PROTONIX) 40 MG tablet Take 1 tablet (40 mg total) by mouth daily. 11/28/19  Yes Larey Dresser, MD  STIOLTO RESPIMAT 2.5-2.5 MCG/ACT AERS INHALE 2 PUFFS INTO THE LUNGS DAILY 09/06/19  Yes Spero Geralds, MD  Blood Pressure Monitor KIT 1 Device by Does not apply route 2 (two) times daily. 11/29/18   Kerin Perna, NP    Allergies    Imdur [isosorbide nitrate] and Tramadol  Review of Systems   Review of Systems  Constitutional: Negative for chills, diaphoresis and fever.  Respiratory: Negative for cough and shortness of breath.   Cardiovascular: Positive for chest pain. Negative for leg swelling.  Gastrointestinal: Negative for abdominal pain, diarrhea, nausea and vomiting.  Genitourinary: Negative for dysuria and hematuria.  Musculoskeletal: Negative for back pain.  Neurological: Negative  for dizziness, syncope, weakness and numbness.  All other systems reviewed and are negative.   Physical Exam Updated Vital Signs BP (!) 152/88 (BP Location: Right Arm)   Pulse 72   Temp 98.6 F (37 C) (Oral)   Resp 17   SpO2 100%   Physical Exam Vitals and nursing note reviewed.  Constitutional:      General: He is not in acute distress.    Appearance: He is well-developed. He is not diaphoretic.  HENT:     Head: Normocephalic and atraumatic.     Mouth/Throat:     Mouth: Mucous membranes are moist.     Pharynx: Oropharynx is clear.  Eyes:     Conjunctiva/sclera: Conjunctivae normal.  Cardiovascular:     Rate and Rhythm: Normal rate and regular rhythm.     Pulses: Normal pulses.          Radial pulses are 2+ on the right side and 2+ on the left side.       Posterior tibial pulses are 2+ on the right side and 2+ on the left side.     Heart sounds: Normal heart sounds.     Comments: Tactile temperature in the extremities appropriate and equal bilaterally. Pulmonary:     Effort: Pulmonary effort is normal. No respiratory distress.     Breath sounds: Normal breath sounds.     Comments: No increased work of breathing.  Speaks full sentences without difficulty. Chest:     Chest wall: Tenderness present. No deformity, swelling or crepitus.    Abdominal:     Palpations: Abdomen is soft.     Tenderness: There is no abdominal tenderness. There is no guarding.  Musculoskeletal:     Cervical back: Neck supple.     Right lower leg: No edema.     Left lower leg: No edema.  Skin:    General:  Skin is warm and dry.  Neurological:     Mental Status: He is alert.     Comments: No noted acute cognitive deficit. Sensation grossly intact to light touch in the extremities.   Grip strengths equal bilaterally.   Strength 5/5 in all extremities.  Coordination intact.  Cranial nerves III-XII grossly intact.  Handles oral secretions without noted difficulty.  No noted phonation or speech  deficit. No facial droop.   Psychiatric:        Mood and Affect: Mood and affect normal.        Speech: Speech normal.        Behavior: Behavior normal.     ED Results / Procedures / Treatments   Labs (all labs ordered are listed, but only abnormal results are displayed) Labs Reviewed  BASIC METABOLIC PANEL - Abnormal; Notable for the following components:      Result Value   CO2 20 (*)    BUN 30 (*)    Creatinine, Ser 3.90 (*)    GFR, Estimated 18 (*)    All other components within normal limits  CBC - Abnormal; Notable for the following components:   RBC 3.96 (*)    Hemoglobin 12.7 (*)    HCT 37.8 (*)    Platelets 106 (*)    All other components within normal limits  TROPONIN I (HIGH SENSITIVITY) - Abnormal; Notable for the following components:   Troponin I (High Sensitivity) 52 (*)    All other components within normal limits  TROPONIN I (HIGH SENSITIVITY) - Abnormal; Notable for the following components:   Troponin I (High Sensitivity) 65 (*)    All other components within normal limits   BUN  Date Value Ref Range Status  05/15/2020 30 (H) 6 - 20 mg/dL Final  04/26/2020 31 (H) 6 - 20 mg/dL Final  01/10/2020 25 (H) 6 - 20 mg/dL Final  11/26/2019 24 (H) 6 - 20 mg/dL Final  11/02/2019 27 (H) 6 - 24 mg/dL Final  12/23/2017 33 (H) 6 - 24 mg/dL Final  12/17/2017 35 (H) 6 - 24 mg/dL Final  12/13/2017 40 (H) 6 - 24 mg/dL Final   Creat  Date Value Ref Range Status  06/08/2014 1.52 (H) 0.50 - 1.35 mg/dL Final   Creatinine, Ser  Date Value Ref Range Status  05/15/2020 3.90 (H) 0.61 - 1.24 mg/dL Final  04/26/2020 3.46 (H) 0.61 - 1.24 mg/dL Final  01/10/2020 2.96 (H) 0.61 - 1.24 mg/dL Final  11/26/2019 3.31 (H) 0.61 - 1.24 mg/dL Final   Hemoglobin  Date Value Ref Range Status  05/15/2020 12.7 (L) 13.0 - 17.0 g/dL Final  11/26/2019 12.8 (L) 13.0 - 17.0 g/dL Final  11/25/2019 11.9 (L) 13.0 - 17.0 g/dL Final  11/24/2019 13.5 13.0 - 17.0 g/dL Final  06/22/2017 12.2  (L) 13.0 - 17.7 g/dL Final    EKG EKG Interpretation  Date/Time:  Wednesday May 15 2020 14:31:29 EST Ventricular Rate:  95 PR Interval:  150 QRS Duration: 90 QT Interval:  376 QTC Calculation: 472 R Axis:   37 Text Interpretation: Normal sinus rhythm Biatrial enlargement Biventricular hypertrophy with repolarization abnormality Cannot rule out Septal infarct , age undetermined Abnormal ECG no sig change from previous Confirmed by Charlesetta Shanks [phone]) on 05/15/2020 6:59:23 PM   Radiology DG Chest 2 View  Result Date: 05/15/2020 CLINICAL DATA:  Chest pain EXAM: CHEST - 2 VIEW COMPARISON:  11/24/2019, CT 11/24/2019, 09/24/2018 FINDINGS: Post sternotomy changes. Similar appearance of aortic stent graft and  valve prostheses. Recording device over the left chest. No focal opacity or pleural effusion. Mild atelectasis or scar at the left base. Stable cardiomediastinal silhouette. No pneumothorax. IMPRESSION: No active cardiopulmonary disease. Similar postsurgical changes of the mediastinum. Electronically Signed   By: Donavan Foil M.D.   On: 05/15/2020 15:31   VAS US RENAL ARTERY DUPLEX  Result Date: 05/15/2020 ABDOMINAL VISCERAL Indications: Patient has had increasing chest pain over the last month. History              of hypertension with renal failure. High Risk Factors: Hypertension, hyperlipidemia, current smoker, prior MI,                    coronary artery disease, prior CVA. Other Factors: History of CKD stage IV                 Brachial pressure at end of exam 175/90 mmHg.                 Most recent blood work                04/26/2020                BUN 31                Creatinine 3.46. Vascular Interventions: History of AAA repair 2016 at [facility] , open repair of TAAA                         secondary to chronic dissection with multi branch dacron                         graft 12/2016,bioprosthetic AVR 2016. Limitations: Air/bowel gas and Patient unable to hold breath. Comparison Study:  Prior renal artery duplex exam on 03/28/2014 showed no evidence                   of renal artery stenosis. Right ostial arterial velocity 116                   cm/s and left mid renal artery velocity 67 cm/s. Performing Technologist: Salvadore Dom RVT, RDCS (AE), RDMS  Examination Guidelines: A complete evaluation includes B-mode imaging, spectral Doppler, color Doppler, and power Doppler as needed of all accessible portions of each vessel. Bilateral testing is considered an integral part of a complete examination. Limited examinations for reoccurring indications may be performed as noted.  Duplex Findings: +--------------------+--------+--------+------+-------------+ Mesenteric          PSV cm/sEDV cm/sPlaque  Comments    +--------------------+--------+--------+------+-------------+ Aorta Prox            105                               +--------------------+--------+--------+------+-------------+ Aorta Distal           46                               +--------------------+--------+--------+------+-------------+ Celiac Artery Origin  184                 stent present +--------------------+--------+--------+------+-------------+ SMA Origin            145      10         stent present +--------------------+--------+--------+------+-------------+ +-------------+--------+--------+  Celiac Stent PSV cm/sEDV cm/s +-------------+--------+--------+ Prox to stent  184            +-------------+--------+--------+  +---------+--------+--------+ SMA StentPSV cm/sEDV cm/s +---------+--------+--------+ Origin     145      10    +---------+--------+--------+ +-------------+--------+--------+ RRA Stent    PSV cm/sEDV cm/s +-------------+--------+--------+ Prox to stent  105            +-------------+--------+--------+ Origin         234      29    +-------------+--------+--------+ Mid            104      19    +-------------+--------+--------+ Distal          83       17    +-------------+--------+--------+   Technologist observations/stent(s): Fluid collection anterior to proximal/mid aorta measuring 5.3 x 2.9 x 3.5 cm, question post surgical  +------------------+--------+--------+-------+ Right Renal ArteryPSV cm/sEDV cm/sComment +------------------+--------+--------+-------+ Mid                 136      24           +------------------+--------+--------+-------+ Distal               59      7            +------------------+--------+--------+-------+ +-----------------+--------+--------+------------------------------------+ Left Renal ArteryPSV cm/sEDV cm/s              Comment                +-----------------+--------+--------+------------------------------------+ Origin                           not seen due to respiratory movement +-----------------+--------+--------+------------------------------------+ Proximal            23      0                                         +-----------------+--------+--------+------------------------------------+ Mid                 42      2                                         +-----------------+--------+--------+------------------------------------+ Distal              46      3                                         +-----------------+--------+--------+------------------------------------+ +------------+--------+--------+----+-----------+--------+--------+----+ Right KidneyPSV cm/sEDV cm/sRI  Left KidneyPSV cm/sEDV cm/sRI   +------------+--------+--------+----+-----------+--------+--------+----+ Upper Pole  12      0       0.99Upper Pole 17      0       0.98 +------------+--------+--------+----+-----------+--------+--------+----+ Mid         15      0       0.99Mid        12      0       0.99 +------------+--------+--------+----+-----------+--------+--------+----+ Lower Pole  13      0       0.98Lower  Pole 13      0       0.98  +------------+--------+--------+----+-----------+--------+--------+----+ Hilar       30      9       0.70Hilar      33      6       0.82 +------------+--------+--------+----+-----------+--------+--------+----+ +------------------+-------+------------------+--------+ Right Kidney             Left Kidney                +------------------+-------+------------------+--------+ RAR                      RAR                        +------------------+-------+------------------+--------+ RAR (manual)      2.2    RAR (manual)      .4       +------------------+-------+------------------+--------+ Cortex                   Cortex                     +------------------+-------+------------------+--------+ Cortex thickness  9.00 mmCorex thickness   14.00 mm +------------------+-------+------------------+--------+ Kidney length (cm)9.70   Kidney length (cm)9.80     +------------------+-------+------------------+--------+  Findings reported to DOD Dr. Gardiner Rhyme at 9:45 am . Patient instructed to go to [facility] ER. Summary: Largest Aortic Diameter: 3.2 cm  Renal:  Right: Abnormal size for the right kidney. Abnormal right Resistive        Index. RRV flow present. Normal cortical thickness of right        kidney. 1-59% stenosis of the right renal arterial proximal        stent. Hyperechoic renal echotexture with decreased overall        color flow perfusion. Left:  Abnormal size for the left kidney. Abnormal left Resisitve        Index. Normal cortical thickness of the left kidney. LRV flow        present. No evidence of left renal artery stenosis.        Hyperechoic renal echotexture with decreased overall color        flow perfusion. Mesenteric: Areas of limited visceral study include left renal artery. Limited exam due to patient's inability to hold his breath and elevated respiratory movement.  Fluid collection anterior to proximal/mid aorta measuring 5.3 x 2.9 x 3.5 cm, question post  surgical  *See table(s) above for measurements and observations.  Diagnosing physician: Carlyle Dolly MD  Electronically signed by Carlyle Dolly MD on 05/15/2020 at 10:03:26 AM.    Final     Procedures Procedures   Medications Ordered in ED Medications  hydrALAZINE (APRESOLINE) tablet 25 mg (25 mg Oral Given 05/15/20 1947)  amLODipine (NORVASC) tablet 10 mg (10 mg Oral Given 05/15/20 1947)  ondansetron (ZOFRAN) injection 4 mg (4 mg Intravenous Given 05/15/20 1949)  morphine 4 MG/ML injection 4 mg (4 mg Intravenous Given 05/15/20 1952)    ED Course  I have reviewed the triage vital signs and the nursing notes.  Pertinent labs & imaging results that were available during my care of the patient were reviewed by me and considered in my medical decision making (see chart for details).  Clinical Course as of 05/15/20 2359  Wed May 15, 2020  2107 Patient endorses resolution in his symptoms.  He now adds that he knows he gets increased  pain and discomfort when his blood pressure is elevated. [SJ]    Clinical Course User Index [SJ] Joy, Helane Gunther, PA-C   MDM Rules/Calculators/A&P                          Patient presents with left lower chest pain for at least the last week. Patient is nontoxic appearing, afebrile, not tachycardic, not tachypneic, not hypotensive, maintains excellent SPO2 on room air, and is in no apparent distress.   I have reviewed the patient's chart to obtain more information.   I reviewed and interpreted the patient's labs and radiological studies. No acute abnormalities on EKG. Renal ultrasound performed yesterday showed mild right renal artery stenosis without additional significant finding. Mild troponin elevation, but less than previous values. Patient's blood pressure was elevated, this was addressed, and patient's symptoms resolved.  Return precautions discussed.  Patient voices understanding of these instructions, accepts the plan, and is comfortable with  discharge.  Findings and plan of care discussed with attending physician, Charlesetta Shanks, MD.    Vitals:   05/15/20 1952 05/15/20 2045 05/15/20 2100 05/15/20 2230  BP: (!) 157/96 136/80 (!) 141/75 122/67  Pulse: 80 83 88 76  Resp: _0 Temp:      TempSrc:      SpO2: 99% 100% 99% 100%     Final Clinical Impression(s) / ED Diagnoses Final diagnoses:  Nonspecific chest pain    Rx / DC Orders ED Discharge Orders    None       Layla Maw 05/16/20 0013    Lorayne Bender, PA-C 05/16/20 0014    Charlesetta Shanks, MD 05/16/20 [phone]

## 2020-05-15 NOTE — Telephone Encounter (Signed)
Patient aware.

## 2020-05-15 NOTE — Discharge Instructions (Signed)
Recommend follow-up with your cardiologist on this matter.

## 2020-05-15 NOTE — Telephone Encounter (Signed)
-----   Message from Larey Dresser, MD sent at 05/14/2020  3:56 PM EST ----- Mild right renal artery stenosis.  No change to therapy.

## 2020-05-16 DIAGNOSIS — I639 Cerebral infarction, unspecified: Secondary | ICD-10-CM | POA: Insufficient documentation

## 2020-05-16 HISTORY — DX: Cerebral infarction, unspecified: I63.9

## 2020-05-17 ENCOUNTER — Encounter (HOSPITAL_COMMUNITY): Payer: Self-pay

## 2020-05-17 ENCOUNTER — Emergency Department (HOSPITAL_COMMUNITY): Payer: Medicare HMO

## 2020-05-17 ENCOUNTER — Other Ambulatory Visit: Payer: Self-pay

## 2020-05-17 ENCOUNTER — Observation Stay (HOSPITAL_COMMUNITY)
Admission: EM | Admit: 2020-05-17 | Discharge: 2020-05-19 | Disposition: A | Discharge status: Home / Self Care | Payer: Medicare HMO | Attending: Emergency Medicine | Admitting: Emergency Medicine

## 2020-05-17 ENCOUNTER — Telehealth: Payer: Self-pay | Admitting: Internal Medicine

## 2020-05-17 DIAGNOSIS — R079 Chest pain, unspecified: Secondary | ICD-10-CM | POA: Diagnosis present

## 2020-05-17 DIAGNOSIS — Z20822 Contact with and (suspected) exposure to covid-19: Secondary | ICD-10-CM | POA: Diagnosis not present

## 2020-05-17 DIAGNOSIS — Z7982 Long term (current) use of aspirin: Secondary | ICD-10-CM | POA: Insufficient documentation

## 2020-05-17 DIAGNOSIS — I5042 Chronic combined systolic (congestive) and diastolic (congestive) heart failure: Secondary | ICD-10-CM | POA: Diagnosis not present

## 2020-05-17 DIAGNOSIS — F1290 Cannabis use, unspecified, uncomplicated: Secondary | ICD-10-CM | POA: Diagnosis present

## 2020-05-17 DIAGNOSIS — E785 Hyperlipidemia, unspecified: Secondary | ICD-10-CM | POA: Diagnosis present

## 2020-05-17 DIAGNOSIS — Z952 Presence of prosthetic heart valve: Secondary | ICD-10-CM

## 2020-05-17 DIAGNOSIS — N184 Chronic kidney disease, stage 4 (severe): Secondary | ICD-10-CM | POA: Diagnosis not present

## 2020-05-17 DIAGNOSIS — I25119 Atherosclerotic heart disease of native coronary artery with unspecified angina pectoris: Principal | ICD-10-CM | POA: Insufficient documentation

## 2020-05-17 DIAGNOSIS — J42 Unspecified chronic bronchitis: Secondary | ICD-10-CM | POA: Diagnosis present

## 2020-05-17 DIAGNOSIS — Z79899 Other long term (current) drug therapy: Secondary | ICD-10-CM | POA: Insufficient documentation

## 2020-05-17 DIAGNOSIS — I119 Hypertensive heart disease without heart failure: Secondary | ICD-10-CM | POA: Diagnosis present

## 2020-05-17 DIAGNOSIS — Z8679 Personal history of other diseases of the circulatory system: Secondary | ICD-10-CM

## 2020-05-17 DIAGNOSIS — I130 Hypertensive heart and chronic kidney disease with heart failure and stage 1 through stage 4 chronic kidney disease, or unspecified chronic kidney disease: Secondary | ICD-10-CM | POA: Insufficient documentation

## 2020-05-17 DIAGNOSIS — F17210 Nicotine dependence, cigarettes, uncomplicated: Secondary | ICD-10-CM | POA: Insufficient documentation

## 2020-05-17 DIAGNOSIS — Z8673 Personal history of transient ischemic attack (TIA), and cerebral infarction without residual deficits: Secondary | ICD-10-CM | POA: Diagnosis not present

## 2020-05-17 DIAGNOSIS — Z955 Presence of coronary angioplasty implant and graft: Secondary | ICD-10-CM | POA: Diagnosis not present

## 2020-05-17 DIAGNOSIS — I10 Essential (primary) hypertension: Secondary | ICD-10-CM | POA: Diagnosis present

## 2020-05-17 DIAGNOSIS — Z720 Tobacco use: Secondary | ICD-10-CM | POA: Diagnosis present

## 2020-05-17 DIAGNOSIS — R778 Other specified abnormalities of plasma proteins: Secondary | ICD-10-CM

## 2020-05-17 LAB — TROPONIN I (HIGH SENSITIVITY)
Troponin I (High Sensitivity): 136 ng/L (ref <18)
Troponin I (High Sensitivity): 201 ng/L (ref <18)
Troponin I (High Sensitivity): 193 ng/L (ref <18)

## 2020-05-17 LAB — RESP PANEL BY RT-PCR (FLU A&B, COVID) ARPGX2
Influenza A by PCR: NEGATIVE
Influenza B by PCR: NEGATIVE
SARS Coronavirus 2 by RT PCR: NEGATIVE

## 2020-05-17 LAB — HEPATIC FUNCTION PANEL
ALT: 11 U/L (ref 0–44)
AST: 17 U/L (ref 15–41)
Albumin: 3.9 g/dL (ref 3.5–5.0)
Alkaline Phosphatase: 70 U/L (ref 38–126)
Bilirubin, Direct: 0.1 mg/dL (ref 0.0–0.2)
Indirect Bilirubin: 0.7 mg/dL (ref 0.3–0.9)
Total Bilirubin: 0.8 mg/dL (ref 0.3–1.2)
Total Protein: 7.2 g/dL (ref 6.5–8.1)

## 2020-05-17 LAB — LIPID PANEL
Cholesterol: 119 mg/dL (ref 0–200)
HDL: 57 mg/dL (ref >40)
LDL Cholesterol: 55 mg/dL (ref 0–99)
Total CHOL/HDL Ratio: 2.1 RATIO
Triglycerides: 37 mg/dL (ref <150)
VLDL: 7 mg/dL (ref 0–40)

## 2020-05-17 LAB — BASIC METABOLIC PANEL
Anion gap: 9 (ref 5–15)
BUN: 33 mg/dL — ABNORMAL HIGH (ref 6–20)
CO2: 21 mmol/L — ABNORMAL LOW (ref 22–32)
Calcium: 9 mg/dL (ref 8.9–10.3)
Chloride: 109 mmol/L (ref 98–111)
Creatinine, Ser: 3.92 mg/dL — ABNORMAL HIGH (ref 0.61–1.24)
GFR, Estimated: 18 mL/min — ABNORMAL LOW (ref >60)
Glucose, Bld: 97 mg/dL (ref 70–99)
Potassium: 3.7 mmol/L (ref 3.5–5.1)
Sodium: 139 mmol/L (ref 135–145)

## 2020-05-17 LAB — RAPID URINE DRUG SCREEN, HOSP PERFORMED
Amphetamines: NOT DETECTED
Barbiturates: NOT DETECTED
Benzodiazepines: NOT DETECTED
Cocaine: NOT DETECTED
Opiates: POSITIVE — AB
Tetrahydrocannabinol: POSITIVE — AB

## 2020-05-17 LAB — CBC
HCT: 36.1 % — ABNORMAL LOW (ref 39.0–52.0)
Hemoglobin: 12 g/dL — ABNORMAL LOW (ref 13.0–17.0)
MCH: 31.3 pg (ref 26.0–34.0)
MCHC: 33.2 g/dL (ref 30.0–36.0)
MCV: 94.3 fL (ref 80.0–100.0)
Platelets: 102 10*3/uL — ABNORMAL LOW (ref 150–400)
RBC: 3.83 MIL/uL — ABNORMAL LOW (ref 4.22–5.81)
RDW: 13.5 % (ref 11.5–15.5)
WBC: 5.4 10*3/uL (ref 4.0–10.5)
nRBC: 0 % (ref 0.0–0.2)

## 2020-05-17 LAB — APTT: aPTT: 33 seconds (ref 24–36)

## 2020-05-17 LAB — TSH: TSH: 2.804 u[IU]/mL (ref 0.350–4.500)

## 2020-05-17 LAB — PROTIME-INR
INR: 1.1 (ref 0.8–1.2)
Prothrombin Time: 13.3 seconds (ref 11.4–15.2)

## 2020-05-17 LAB — LIPASE, BLOOD: Lipase: 43 U/L (ref 11–51)

## 2020-05-17 LAB — BRAIN NATRIURETIC PEPTIDE: B Natriuretic Peptide: 323.2 pg/mL — ABNORMAL HIGH (ref 0.0–100.0)

## 2020-05-17 MED ORDER — LABETALOL HCL 5 MG/ML IV SOLN: 5.0000 mg | Freq: Once | INTRAVENOUS | Status: DC

## 2020-05-17 MED ORDER — AMLODIPINE BESYLATE 10 MG PO TABS
10.0000 mg | ORAL_TABLET | Freq: Every day | ORAL | Status: DC
  Administered 2020-05-18 – 2020-05-19 (×2): 10 mg via ORAL
  Filled 2020-05-17 (×3): qty 1

## 2020-05-17 MED ORDER — HEPARIN (PORCINE) 25000 UT/250ML-% IV SOLN
1200.0000 [IU]/h | INTRAVENOUS | Status: DC
  Administered 2020-05-17: 900 [IU]/h via INTRAVENOUS
  Filled 2020-05-17 (×2): qty 250

## 2020-05-17 MED ORDER — NICOTINE 14 MG/24HR TD PT24
14.0000 mg | MEDICATED_PATCH | TRANSDERMAL | Status: DC
  Administered 2020-05-17 – 2020-05-18 (×2): 14 mg via TRANSDERMAL
  Filled 2020-05-17 (×2): qty 1

## 2020-05-17 MED ORDER — HYDROCODONE-ACETAMINOPHEN 5-325 MG PO TABS
1.0000 | ORAL_TABLET | ORAL | Status: DC | PRN
  Administered 2020-05-17: 2 via ORAL
  Filled 2020-05-17 (×2): qty 2

## 2020-05-17 MED ORDER — ACETAMINOPHEN 325 MG PO TABS: 650.0000 mg | ORAL_TABLET | Freq: Four times a day (QID) | ORAL | Status: DC | PRN

## 2020-05-17 MED ORDER — ALBUTEROL SULFATE HFA 108 (90 BASE) MCG/ACT IN AERS: 2.0000 | INHALATION_SPRAY | Freq: Four times a day (QID) | RESPIRATORY_TRACT | Status: DC | PRN

## 2020-05-17 MED ORDER — SODIUM CHLORIDE 0.9 % IV SOLN: 250.0000 mL | INTRAVENOUS | Status: DC | PRN

## 2020-05-17 MED ORDER — HYDRALAZINE HCL 25 MG PO TABS
25.0000 mg | ORAL_TABLET | Freq: Two times a day (BID) | ORAL | Status: DC
  Administered 2020-05-17 – 2020-05-19 (×3): 25 mg via ORAL
  Filled 2020-05-17 (×5): qty 1

## 2020-05-17 MED ORDER — MORPHINE SULFATE (PF) 4 MG/ML IV SOLN
4.0000 mg | Freq: Once | INTRAVENOUS | Status: AC
  Administered 2020-05-17: 4 mg via INTRAVENOUS
  Filled 2020-05-17: qty 1

## 2020-05-17 MED ORDER — SODIUM CHLORIDE 0.9% FLUSH
3.0000 mL | INTRAVENOUS | Status: DC | PRN
Start: 2020-05-17 — End: 2020-05-19

## 2020-05-17 MED ORDER — MINOXIDIL 10 MG PO TABS
10.0000 mg | ORAL_TABLET | Freq: Two times a day (BID) | ORAL | Status: DC
  Administered 2020-05-17 – 2020-05-19 (×4): 10 mg via ORAL
  Filled 2020-05-17 (×4): qty 1

## 2020-05-17 MED ORDER — SODIUM CHLORIDE 0.9% FLUSH
3.0000 mL | Freq: Two times a day (BID) | INTRAVENOUS | Status: DC
  Administered 2020-05-17 – 2020-05-19 (×3): 3 mL via INTRAVENOUS

## 2020-05-17 MED ORDER — HEPARIN BOLUS VIA INFUSION
4000.0000 [IU] | Freq: Once | INTRAVENOUS | Status: AC
  Administered 2020-05-17: 4000 [IU] via INTRAVENOUS
  Filled 2020-05-17: qty 4000

## 2020-05-17 MED ORDER — ACETAMINOPHEN 650 MG RE SUPP: 650.0000 mg | Freq: Four times a day (QID) | RECTAL | Status: DC | PRN

## 2020-05-17 MED ORDER — AMITRIPTYLINE HCL 25 MG PO TABS
50.0000 mg | ORAL_TABLET | Freq: Every day | ORAL | Status: DC
  Administered 2020-05-17 – 2020-05-18 (×2): 50 mg via ORAL
  Filled 2020-05-17 (×3): qty 2

## 2020-05-17 MED ORDER — LABETALOL HCL 5 MG/ML IV SOLN
10.0000 mg | INTRAVENOUS | Status: DC | PRN
  Administered 2020-05-18: 10 mg via INTRAVENOUS
  Filled 2020-05-17: qty 4

## 2020-05-17 MED ORDER — HYDROMORPHONE HCL 1 MG/ML IJ SOLN
0.5000 mg | Freq: Once | INTRAMUSCULAR | Status: AC
  Administered 2020-05-17: 0.5 mg via INTRAVENOUS
  Filled 2020-05-17: qty 1

## 2020-05-17 MED ORDER — LABETALOL HCL 5 MG/ML IV SOLN
10.0000 mg | Freq: Once | INTRAVENOUS | Status: AC
  Administered 2020-05-17: 10 mg via INTRAVENOUS
  Filled 2020-05-17: qty 4

## 2020-05-17 MED ORDER — CARVEDILOL 25 MG PO TABS
25.0000 mg | ORAL_TABLET | Freq: Two times a day (BID) | ORAL | Status: DC
  Administered 2020-05-18 – 2020-05-19 (×3): 25 mg via ORAL
  Filled 2020-05-17 (×4): qty 1

## 2020-05-17 MED ORDER — CLONIDINE HCL 0.3 MG/24HR TD PTWK
0.3000 mg | MEDICATED_PATCH | TRANSDERMAL | Status: DC
  Administered 2020-05-17: 0.3 mg via TRANSDERMAL
  Filled 2020-05-17: qty 1

## 2020-05-17 MED ORDER — ASPIRIN EC 81 MG PO TBEC
81.0000 mg | DELAYED_RELEASE_TABLET | Freq: Every day | ORAL | Status: DC
  Administered 2020-05-18 – 2020-05-19 (×2): 81 mg via ORAL
  Filled 2020-05-17 (×3): qty 1

## 2020-05-17 MED ORDER — DOXAZOSIN MESYLATE 4 MG PO TABS
16.0000 mg | ORAL_TABLET | Freq: Every day | ORAL | Status: DC
  Administered 2020-05-17: 16 mg via ORAL
  Filled 2020-05-17 (×2): qty 4

## 2020-05-17 NOTE — ED Provider Notes (Signed)
[facility] DEPT Provider Note   CSN: [id number] Arrival date & time: 05/17/20  1508     History Chief Complaint  Patient presents with  . Chest Pain    MAOR MECKEL is a 50 y.o. male.  HPI Patient is a 50 year old male with past medical history significant for CAD, NSTEMI 11/2017 with stent, thoracic aortic dissection s/p repair in February 2016, last admitted 11/2019 for atypical chest pain echo showed EF with severe LVH with preserved EF.   Patient states over the past 5 years he has had chronic chest pain he states that it occurs on a daily basis and he is used to this pain however he states that over the past week he felt that his symptoms have steadily worsened.  He states that he is experiencing such severe chest pain that he is unable to walk very far and to do his normal activities of daily living.  He states that it is severe heavy pressure on his chest caused him to feel short of breath as well as nauseous.  He has had no episodes of vomiting but he has had some dry heaving.  He states that his chest pain is severe, 10/10, sternal but radiates into left shoulder associated with nausea. Denies any diaphoresis.  No lightheadedness or dizziness.     Past Medical History:  Diagnosis Date  . Adenomatous colon polyp 08/2015  . Anxiety   . Aortic disease ([place])   . Aortic dissection (HCC)    a. admx 04/2014 >> L renal infarct; a/c renal failure >> b.  s/p Bioprosthetic Bent[place] and total arch replacement and staged endovascular repair of descending aortic aneurysm ([facility] - Dr. [name])  . CAD (coronary artery disease)    a. LHC 4/16:  oD1 60%  . Cardiomyopathy ([place])    a. non-ischemic - probably related to untreated HTN and ETOH abuse - Echo 3/13 with EF 35-40% >> b. Echo 4/16: Severe LVH, EF 55-60%, moderate AI, moderate MR, mild LAE, trivial effusion, known type B dissection with communication between true and false lumens with suprasternal images  suggesting dissection plane may propagate to at least left subclavian takeoff, root above aortic valve okay    . Chronic abdominal pain   . Chronic combined systolic and diastolic congestive heart failure ([place])    a. 05/2011: Adm with pulm edema/HTN urgency, EF 35-40% with diffuse hypokinesis and moderate to severe mitral regurgitation. Cardiomyopathy likely due to uncontrolled HTN and ETOH abuse - cath deferred due to renal insufficiency (felt due to uncontrolled HTN). bJodie Echevaria MV 06/2011: EF 37% and no ischemia or infarction. c. EF 45-50% by echo 01/2012.  [name] Chronic sinusitis   . CKD (chronic kidney disease)    a. 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  peak creatinine 3.46 during admx for aortic dissection 2/16.  sees Dr [name]  . DDD (degenerative disc disease), lumbar   . Depression   . Descending thoracic aortic aneurysm ([place])   . Dissecting aneurysm of thoracic aorta ([place])   . ETOH abuse    a. Reported to have quit 05/2011.  . Frequent headaches   . GERD (gastroesophageal reflux disease)   . Headache(784.0)    "q other day" (08/08/2013)  . Heart murmur   . Hemorrhoid thrombosis   . History of echocardiogram    Echo 1/17:  Severe LVH, EF 55-60%, no RWMA, Gr 2 DD, AVR ok, mild to mod MR, mild LAE, mild reduced RVSF, mod RAE  . History  of medication noncompliance   . HYPERLIPIDEMIA   . Hypertension    a. Hx of HTN urgency secondary to noncompliance. b. urinary metanephrine and catecholeamine levels normal 2013.  c. Renal art [name] 1/16:  No evidence of renal artery stenosis noted bilaterally.  . INGUINAL HERNIA   . Pneumonia ~ 2013  . Renal insufficiency   . Serrated adenoma of colon 08/2015  . Stroke ([place])   . Tobacco abuse   . Valvular heart disease    a. Echo 05/2011: moderate to severe eccentric MR and mild to moderate AI with prolapsing left coronary cusp. b. Echo 01/2012: mild-mod AI, mild dilitation of aortic root, mild MR.;  c. Echo 1/16: Severe LVH consistent with hypertrophic cardio  myopathy, EF 50%, no RWMA, mod AI, mild MR, mild RAE, dilated Ao root (40 mm);     Patient Active Problem List   Diagnosis Date Noted  . Cryptogenic stroke ([place]) 05/16/2020  . History of loop recorder 02/13/2020  . Chest pain 11/24/2019  . COPD (chronic obstructive pulmonary disease) ([place]) 11/24/2019  . Nonspecific chest pain   . Chest pain, rule out acute myocardial infarction 09/24/2018  . Chronic systolic (congestive) heart failure ([place]) 04/25/2018  . Acute on chronic combined systolic and diastolic CHF (congestive heart failure) ([place]) 12/08/2017  . Coronary artery disease involving native coronary artery of native heart with angina pectoris ([place]) 12/06/2017  . NSTEMI (non-ST elevated myocardial infarction) ([place]) 12/02/2017  . Hypertensive emergency - Accelerated Hypertension 12/02/2017  . History of stroke 10/05/2017  . Somnolence, daytime 04/07/2017  . Snoring 04/07/2017  . Cerebral infarction due to embolism of left middle cerebral artery ([place]) 06/03/2016  . History of aortic dissection 06/03/2016  . Palpitation   . Cerebrovascular accident (CVA) due to embolism of left middle cerebral artery ([place])   . Seizures ([place]) 02/28/2016  . HLD (hyperlipidemia) 08/28/2015  . 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[place]) 06/07/2015  . Cardiomyopathy ([place]) 03/01/2015  . Acid reflux 03/01/2015  . Essential hypertension 03/01/2015  . S/P aortic dissection repair 10/03/2014  . H/O aortic valve replacement 10/03/2014  . CKD (chronic kidney disease), stage IV ([place]) 05/15/2014  . AKI (acute kidney injury) ([place])   . Dissecting aneurysm of thoracic aorta, Stanford type B ([place]) 04/24/2014  . Aortic dissection ([place]) 04/23/2014  . Aneurysm of ascending aorta (HCC) 03/28/2014  . Dyspnea 03/27/2014  . ETOH abuse 09/20/2013  . Malignant hypertension 02/20/2013  . Hypertensive urgency 01/27/2012  . Cardiomyopathy, hypertensive ([place]) 01/26/2012  . AI (aortic insufficiency) 01/26/2012  . MR (mitral  regurgitation) 01/26/2012  . Acute renal failure superimposed on stage 3 chronic kidney disease ([place]) 01/26/2012  . Hypertensive cardiomyopathy, without heart failure ([place]): EF reduced to 35-40% from 55%. 06/10/2011  . Chronic bronchitis 05/23/2011  . Cardiomegaly - hypertensive 05/22/2011  . Marijuana use 05/22/2011  . Abdominal pain 05/22/2011  . Hypertension, malignant 05/22/2011  . Anxiety and depression 05/22/2011  . Hyperlipidemia LDL goal <70 08/26/2009  . Tobacco use 08/26/2009  . Headache(784.0) 08/26/2009  . Aortic valve disorder 03/11/2009  . INGUINAL HERNIA 02/18/2009  . Uncontrolled hypertension 01/16/2009    Past Surgical History:  Procedure Laterality Date  . ANKLE SURGERY Bilateral    Fractures bilaterally  . AORTIC VALVE SURGERY  09/2014  . CORONARY ANGIOGRAPHY N/A 12/06/2017   Procedure: CORONARY ANGIOGRAPHY;  Surgeon: Belva Crome, MD;  Location: [facility];  Service: Cardiovascular;  Laterality: N/A;  . CORONARY ANGIOGRAPHY N/A 09/26/2018   Procedure: CORONARY ANGIOGRAPHY;  Surgeon: Nelva Bush, MD;  Location: [facility];  Service: Cardiovascular;  Laterality: N/A;  . CORONARY STENT INTERVENTION N/A 12/10/2017   Procedure: CORONARY STENT INTERVENTION;  Surgeon: [name] Sine, MD;  Location: [facility];  Service: Cardiovascular;  Laterality: N/A;  . FOOT FRACTURE SURGERY Bilateral 2004-2010   "got pins in both of them"  . HEMORRHOID SURGERY N/A 06/15/2015   Procedure: HEMORRHOIDECTOMY;  Surgeon: Stark Klein, MD;  Location: [facility];  Service: General;  Laterality: N/A;  . INGUINAL HERNIA REPAIR Right ~ 1996  . LEFT HEART CATHETERIZATION WITH CORONARY ANGIOGRAM N/A 06/21/2014   Procedure: LEFT HEART CATHETERIZATION WITH CORONARY ANGIOGRAM;  Surgeon: Larey Dresser, MD;  Location: [facility] CATH LAB;  Service: Cardiovascular;  Laterality: N/A;  . LOOP RECORDER INSERTION N/A 06/19/2016   Procedure: Loop Recorder Insertion;  Surgeon: Deboraha Sprang,  MD;  Location: [facility] LAB;  Service: Cardiovascular;  Laterality: N/A;  . TEE WITHOUT CARDIOVERSION N/A 03/12/2016   Procedure: TRANSESOPHAGEAL ECHOCARDIOGRAM (TEE);  Surgeon: Sanda Klein, MD;  Location: [facility] ENDOSCOPY;  Service: Cardiovascular;  Laterality: N/A;       Family History  Problem Relation Age of Onset  . Hypertension Mother   . Hypertension Other   . Colon cancer Paternal Uncle   . Stroke Maternal Aunt   . Heart attack Brother   . Hypertension Brother   . Diabetes Maternal Aunt   . Lung cancer Maternal Uncle   . Stroke Maternal Uncle   . Other Sister        "breathing machine at night"  . Headache Sister   . Thyroid disease Sister   . Throat cancer Neg Hx   . Pancreatic cancer Neg Hx   . Esophageal cancer Neg Hx   . Kidney disease Neg Hx   . Liver disease Neg Hx     Social History   Tobacco Use  . Smoking status: Current Some Day Smoker    Packs/day: 0.25    Years: 23.00    Pack years: 5.75    Types: Cigarettes  . Smokeless tobacco: Never Used  . Tobacco comment: 3 to 4 per day  Substance Use Topics  . Alcohol use: Yes    Alcohol/week: 1.0 standard drink    Types: 1 Cans of beer per week    Comment: occasionally  . Drug use: Yes    Types: Marijuana    Comment: 2 times/week    Home Medications Prior to Admission medications   Medication Sig Start Date End Date Taking? Authorizing Provider  acetaminophen (TYLENOL) 325 MG tablet Take 650 mg by mouth every 6 (six) hours as needed for moderate pain (for pain or headaches).   Yes [provider]  albuterol (VENTOLIN HFA) 108 (90 Base) MCG/ACT inhaler Inhale 2 puffs into the lungs every 6 (six) hours as needed for wheezing or shortness of breath.   Yes [provider]  amitriptyline (ELAVIL) 50 MG tablet Take 50 mg by mouth at bedtime.   Yes [provider]  amLODipine (NORVASC) 10 MG tablet Take 10 mg by mouth daily.   Yes [provider]  aspirin EC 81 MG EC  tablet Take 1 tablet (81 mg total) by mouth daily. 12/12/17  Yes [facility], Tami Lin, PA  carvedilol (COREG) 25 MG tablet Take 25 mg by mouth 2 (two) times daily with a meal.   Yes [provider]  cloNIDine (CATAPRES - DOSED IN MG/24 HR) 0.3 mg/24hr patch Place 1  patch (0.3 mg total) onto the skin once a week. 09/28/18  Yes Arrien, Jimmy Picket, MD  doxazosin (CARDURA) 8 MG tablet Take 2 tablets (16 mg total) by mouth at bedtime. Patient taking differently: Take 8 mg by mouth at bedtime. 12/07/19  Yes Skeet Latch, MD  empagliflozin (JARDIANCE) 10 MG TABS tablet Take 1 tablet (10 mg total) by mouth daily. 12/25/19  Yes Larey Dresser, MD  Evolocumab (REPATHA SURECLICK) 734 MG/ML SOAJ Inject 1 Dose into the skin every 14 (fourteen) days. Patient taking differently: Inject 140 mg into the skin every 14 (fourteen) days. 11/29/19  Yes Hilty, Nadean Corwin, MD  hydrALAZINE (APRESOLINE) 25 MG tablet Take 1 tablet (25 mg total) by mouth 3 (three) times daily. Patient taking differently: Take 25 mg by mouth 2 (two) times daily. 04/26/20 04/26/21 Yes Larey Dresser, MD  HYDROcodone-acetaminophen (NORCO/VICODIN) 5-325 MG tablet Take 1-2 tablets by mouth every 6 (six) hours as needed for severe pain. 02/10/20  Yes Wieters, Hallie C, PA-C  minoxidil (LONITEN) 10 MG tablet TAKE 2 TABLETS(20 MG) BY MOUTH TWICE DAILY Patient taking differently: Take 10 mg by mouth 2 (two) times daily. 01/12/20  Yes Skeet Latch, MD  OVER THE COUNTER MEDICATION Place 1 drop into the left eye daily. Stye Eye Drop   Yes [provider]  pantoprazole (PROTONIX) 40 MG tablet Take 1 tablet (40 mg total) by mouth daily. 11/28/19  Yes Larey Dresser, MD  STIOLTO RESPIMAT 2.5-2.5 MCG/ACT AERS INHALE 2 PUFFS INTO THE LUNGS DAILY 09/06/19  Yes Spero Geralds, MD  Blood Pressure Monitor KIT 1 Device by Does not apply route 2 (two) times daily. 11/29/18   Kerin Perna, NP    Allergies    Imdur [isosorbide  nitrate] and Tramadol  Review of Systems   Review of Systems  Constitutional: Positive for fatigue. Negative for chills and fever.  HENT: Negative for congestion.   Eyes: Negative for pain.  Respiratory: Positive for shortness of breath. Negative for cough.   Cardiovascular: Positive for chest pain. Negative for leg swelling.  Gastrointestinal: Positive for nausea. Negative for abdominal pain, diarrhea and vomiting.  Genitourinary: Negative for dysuria.  Musculoskeletal: Negative for myalgias.  Skin: Negative for rash.  Neurological: Negative for dizziness and headaches.    Physical Exam Updated Vital Signs BP (!) 148/94   Pulse 96   Temp 98.7 F (37.1 C) (Oral)   Resp 18   Ht 6' (1.829 m)   Wt 74.4 kg   SpO2 100%   BMI 22.24 kg/m   Physical Exam Vitals and nursing note reviewed.  Constitutional:      General: He is not in acute distress.    Comments: Uncomfortable appearing 50 year old male speaking full sentences no tachypnea  HENT:     Head: Normocephalic and atraumatic.     Nose: Nose normal.     Mouth/Throat:     Mouth: Mucous membranes are moist.  Eyes:     General: No scleral icterus. Cardiovascular:     Rate and Rhythm: Normal rate and regular rhythm.     Pulses: Normal pulses.     Heart sounds: Murmur heard.      Comments: Bilateral radial artery pulses and dorsalis pedis pulses are 3+ and symmetric.   Pulmonary:     Effort: Pulmonary effort is normal. No respiratory distress.     Breath sounds: Normal breath sounds. No wheezing.  Abdominal:     Palpations: Abdomen is soft.  Tenderness: There is no abdominal tenderness. There is no right CVA tenderness, left CVA tenderness, guarding or rebound.  Musculoskeletal:     Cervical back: Normal range of motion.     Right lower leg: No edema.     Left lower leg: No edema.     Comments: No lower extremity edema or calf tenderness present.  Skin:    General: Skin is warm and dry.     Capillary Refill:  Capillary refill takes less than 2 seconds.  Neurological:     Mental Status: He is alert. Mental status is at baseline.  Psychiatric:        Mood and Affect: Mood normal.        Behavior: Behavior normal.     ED Results / Procedures / Treatments   Labs (all labs ordered are listed, but only abnormal results are displayed) Labs Reviewed  BASIC METABOLIC PANEL - Abnormal; Notable for the following components:      Result Value   CO2 21 (*)    BUN 33 (*)    Creatinine, Ser 3.92 (*)    GFR, Estimated 18 (*)    All other components within normal limits  TROPONIN I (HIGH SENSITIVITY) - Abnormal; Notable for the following components:   Troponin I (High Sensitivity) 136 (*)    All other components within normal limits  CBC  BRAIN NATRIURETIC PEPTIDE  TROPONIN I (HIGH SENSITIVITY)    EKG None  Radiology DG Chest 2 View  Result Date: 05/17/2020 CLINICAL DATA:  Chest pain. Additional history provided: Patient reports chest pain which began several days ago, history of 2 previous myocardial infarctions. Patient reports 10/10 pain. EXAM: CHEST - 2 VIEW COMPARISON:  Prior chest radiographs 05/15/2020. FINDINGS: Prior median sternotomy with redemonstrated aortic stent graft. Prior aortic valve replacement. A loop recorder device projects over the left chest. Unchanged cardiomegaly. No appreciable airspace consolidation or pulmonary edema. No evidence of pleural effusion or pneumothorax. No acute bony abnormality identified. Surgical clips project in the region of the right axilla. IMPRESSION: No evidence of acute cardiopulmonary abnormality. Redemonstrated postsurgical changes, as described. Unchanged cardiomegaly. Electronically Signed   By: Kellie Simmering DO   On: 05/17/2020 16:12    Procedures Procedures   Medications Ordered in ED Medications  morphine 4 MG/ML injection 4 mg (has no administration in time range)    ED Course  I have reviewed the triage vital signs and the nursing  notes.  Pertinent labs & imaging results that were available during my care of the patient were reviewed by me and considered in my medical decision making (see chart for details).  Patient past medical history detailed in HPI presented today with chest pain is been ongoing for approximately 1 week worsening over the past 4 days gradually worsening sternal chest pain that radiates into his left shoulder. Has a history of ACS and thoracic aortic dissection. His symptoms seem to be exertional but also when he lays down. I reviewed patient's notes with Dr. [name] who posited that his chest pain may be multifactorial with reflux and some anginal symptoms.  As his symptoms are severe and his troponin has increased from baseline between 50 -- 82 and is now 11 today discussed with my attending physician.  I discussed this case with my attending physician who cosigned this note including patient's presenting symptoms, physical exam, and planned diagnostics and interventions. Attending physician stated agreement with plan or made changes to plan which were implemented.   Attending  physician assessed patient at bedside.  LFTs within normal limits doubt biliary disease. Lipase was normal without pancreatitis. Clinical Course as of 05/17/20 1816  Fri May 17, 2020  1753 BNP not significantly elevated at 323. Doubt HF as contributing factor.  CBC stable anemia no WBC elevation.  BMP with progressive worsening creat GFR of 18 [WF]  1754 GFR, Estimated(!): 18 [WF]  1754 Troponin I (High Sensitivity)(!!): 136 [WF]  1754 Troponin elevated at 136  [WF]  1814 Discussed with Dr. Marisue Ivan who recommends hosp admission for trending troponins. Recommends heparin and states likely will benefit from [facility] during hospitalization.  [WF]    Clinical Course User Index [WF] Tedd Sias, [place]   MDM Rules/Calculators/A&P                          Patient was discussed with Dr. Davina Poke of cardiology.  Patient is pending a second troponin will admit to hospitalist after second troponin. Heparin ordered by myself.  Oncoming team to talk to hospitalist after second troponin.  Final Clinical Impression(s) / ED Diagnoses Final diagnoses:  None    Rx / DC Orders ED Discharge Orders    None       Tedd Sias, [place] 05/17/20 1820    Luna Fuse, MD 05/18/20 1110

## 2020-05-17 NOTE — H&P (Signed)
History and Physical    [name] JOI:[id number] DOB: Jul 22, 1970 DOA: 05/17/2020  PCP: Kerin Perna, NP   Patient coming from: home   Chief Complaint: chest pain   HPI: [name] is a 50 y.o. male with medical history significant for CAD, NSTEMI 11/2017 with LHC with stent placed in proximal ramus. Repeat cath in 7/20 with nonobstructive disease, AAA repair in 2016 at [facility], open repair of TAAA secondary to chronic dissection with multibranch dacron graft 12/2016, CVA 2017, bioprothetic AVR 2016, HLD, CKD stage IV, malignant HTN, tobacco abuse and HTN cardiomyopathy. Last echo 9/21. Also history GERD not relieved with PPI therapy.  He has history of chronic chest pain for the past 5 years, but reports he has had worsening chest pain over the last week. Pain starts in his abdomen and goes up to his chest and radiates down his left arm. Pain is constant in nature and described as pressure. Pain rated as a 10/10 and is sharp in nature. He states he has chronic chest pain but this pain was much more intense so he came to hospital. He states he will have occssional shortness of breath and light headedness and at times nausea. If he takes tylenol and lays back he feels better and it gets worse with exertion (new for him). He feels like his heart is racing. He denies any drug use, but does smoke. He is an occasional drinker and smokes MJ. He has chronic hx of headaches which is at his baseline. No vision changes, vomiting, coughing, diarrhea, leg swelling.   ED Course: ON arrival vitals stable with bp of 148/94, HR 96, afebrile, RR of 18 and oxygen of 100% on room air. eKG done which showed no significant changes from previous ekg, no ST elevation and sinus tachycardia. Significant labs showed a troponin of 136 (baseline around 60-80) and worsening creatinine to 3.92 with slow trend upwards over last few months. CXR stable. Unchanged cardiomegaly. ER consulted cardiology with elevated tropoinins  (dr. Marisue Ivan). Recommends admit here, trend troponins and heparin/imdur. He was given dose of labetalol for BP control and pain meds.   Review of Systems: As per HPI otherwise all other systems reviewed and are negative.   Past Medical History:  Diagnosis Date  . Adenomatous colon polyp 08/2015  . Anxiety   . Aortic disease ([place])   . Aortic dissection (HCC)    a. admx 04/2014 >> L renal infarct; a/c renal failure >> b.  s/p Bioprosthetic Bentall and total arch replacement and staged endovascular repair of descending aortic aneurysm ([facility] - Dr. [name])  . CAD (coronary artery disease)    a. LHC 4/16:  oD1 60%  . Cardiomyopathy ([place])    a. non-ischemic - probably related to untreated HTN and ETOH abuse - Echo 3/13 with EF 35-40% >> b. Echo 4/16: Severe LVH, EF 55-60%, moderate AI, moderate MR, mild LAE, trivial effusion, known type B dissection with communication between true and false lumens with suprasternal images suggesting dissection plane may propagate to at least left subclavian takeoff, root above aortic valve okay    . Chronic abdominal pain   . Chronic combined systolic and diastolic congestive heart failure ([place])    a. 05/2011: Adm with pulm edema/HTN urgency, EF 35-40% with diffuse hypokinesis and moderate to severe mitral regurgitation. Cardiomyopathy likely due to uncontrolled HTN and ETOH abuse - cath deferred due to renal insufficiency (felt due to uncontrolled HTN). bJodie Echevaria MV 06/2011: EF 37% and no ischemia  or infarction. c. EF 45-50% by echo 01/2012.  [name] Chronic sinusitis   . CKD (chronic kidney disease)    a. 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  peak creatinine 3.46 during admx for aortic dissection 2/16.  sees Dr [name]  . DDD (degenerative disc disease), lumbar   . Depression   . Descending thoracic aortic aneurysm ([place])   . Dissecting aneurysm of thoracic aorta ([place])   . ETOH abuse    a. Reported to have quit 05/2011.  . Frequent headaches   . GERD (gastroesophageal reflux  disease)   . Headache(784.0)    "q other day" (08/08/2013)  . Heart murmur   . Hemorrhoid thrombosis   . History of echocardiogram    Echo 1/17:  Severe LVH, EF 55-60%, no RWMA, Gr 2 DD, AVR ok, mild to mod MR, mild LAE, mild reduced RVSF, mod RAE  . History of medication noncompliance   . HYPERLIPIDEMIA   . Hypertension    a. Hx of HTN urgency secondary to noncompliance. b. urinary metanephrine and catecholeamine levels normal 2013.  c. Renal art [name] 1/16:  No evidence of renal artery stenosis noted bilaterally.  . INGUINAL HERNIA   . Pneumonia ~ 2013  . Renal insufficiency   . Serrated adenoma of colon 08/2015  . Stroke ([place])   . Tobacco abuse   . Valvular heart disease    a. Echo 05/2011: moderate to severe eccentric MR and mild to moderate AI with prolapsing left coronary cusp. b. Echo 01/2012: mild-mod AI, mild dilitation of aortic root, mild MR.;  c. Echo 1/16: Severe LVH consistent with hypertrophic cardio myopathy, EF 50%, no RWMA, mod AI, mild MR, mild RAE, dilated Ao root (40 mm);     Past Surgical History:  Procedure Laterality Date  . ANKLE SURGERY Bilateral    Fractures bilaterally  . AORTIC VALVE SURGERY  09/2014  . CORONARY ANGIOGRAPHY N/A 12/06/2017   Procedure: CORONARY ANGIOGRAPHY;  Surgeon: Belva Crome, MD;  Location: [place] [facility];  Service: Cardiovascular;  Laterality: N/A;  . CORONARY ANGIOGRAPHY N/A 09/26/2018   Procedure: CORONARY ANGIOGRAPHY;  Surgeon: Nelva Bush, MD;  Location: [place] [facility];  Service: Cardiovascular;  Laterality: N/A;  . CORONARY STENT INTERVENTION N/A 12/10/2017   Procedure: CORONARY STENT INTERVENTION;  Surgeon: [name] Sine, MD;  Location: [place] [facility];  Service: Cardiovascular;  Laterality: N/A;  . FOOT FRACTURE SURGERY Bilateral 2004-2010   "got pins in both of them"  . HEMORRHOID SURGERY N/A 06/15/2015   Procedure: HEMORRHOIDECTOMY;  Surgeon: Stark Klein, MD;  Location: [place];  Service: General;  Laterality: N/A;   . INGUINAL HERNIA REPAIR Right ~ 1996  . LEFT HEART CATHETERIZATION WITH CORONARY ANGIOGRAM N/A 06/21/2014   Procedure: LEFT HEART CATHETERIZATION WITH CORONARY ANGIOGRAM;  Surgeon: Larey Dresser, MD;  Location: [facility] C[facility] LAB;  Service: Cardiovascular;  Laterality: N/A;  . LOOP RECORDER INSERTION N/A 06/19/2016   Procedure: Loop Recorder Insertion;  Surgeon: Deboraha Sprang, MD;  Location: [place] CV LAB;  Service: Cardiovascular;  Laterality: N/A;  . TEE WITHOUT CARDIOVERSION N/A 03/12/2016   Procedure: TRANSESOPHAGEAL ECHOCARDIOGRAM (TEE);  Surgeon: Sanda Klein, MD;  Location: [facility] ENDOSCOPY;  Service: Cardiovascular;  Laterality: N/A;    Social History  reports that he has been smoking cigarettes. He has a 5.75 pack-year smoking history. He has never used smokeless tobacco. He reports current alcohol use of about 1.0 standard drink of alcohol per week. He reports current drug use. Drug:  Marijuana.  Allergies  Allergen Reactions  . Imdur [Isosorbide Nitrate] Other (See Comments)    Headaches   . Tramadol Other (See Comments)    Headaches     Family History  Problem Relation Age of Onset  . Hypertension Mother   . Hypertension Other   . Colon cancer Paternal Uncle   . Stroke Maternal Aunt   . Heart attack Brother   . Hypertension Brother   . Diabetes Maternal Aunt   . Lung cancer Maternal Uncle   . Stroke Maternal Uncle   . Other Sister        "breathing machine at night"  . Headache Sister   . Thyroid disease Sister   . Throat cancer Neg Hx   . Pancreatic cancer Neg Hx   . Esophageal cancer Neg Hx   . Kidney disease Neg Hx   . Liver disease Neg Hx     Prior to Admission medications   Medication Sig Start Date End Date Taking? Authorizing Provider  acetaminophen (TYLENOL) 325 MG tablet Take 650 mg by mouth every 6 (six) hours as needed for moderate pain (for pain or headaches).   Yes [provider]  albuterol (VENTOLIN HFA) 108 (90 Base) MCG/ACT inhaler  Inhale 2 puffs into the lungs every 6 (six) hours as needed for wheezing or shortness of breath.   Yes [provider]  amitriptyline (ELAVIL) 50 MG tablet Take 50 mg by mouth at bedtime.   Yes [provider]  amLODipine (NORVASC) 10 MG tablet Take 10 mg by mouth daily.   Yes [provider]  aspirin EC 81 MG EC tablet Take 1 tablet (81 mg total) by mouth daily. 12/12/17  Yes [facility], Tami Lin, PA  carvedilol (COREG) 25 MG tablet Take 25 mg by mouth 2 (two) times daily with a meal.   Yes [provider]  cloNIDine (CATAPRES - DOSED IN MG/24 HR) 0.3 mg/24hr patch Place 1 patch (0.3 mg total) onto the skin once a week. 09/28/18  Yes Arrien, Jimmy Picket, MD  doxazosin (CARDURA) 8 MG tablet Take 2 tablets (16 mg total) by mouth at bedtime. Patient taking differently: Take 8 mg by mouth at bedtime. 12/07/19  Yes Skeet Latch, MD  empagliflozin (JARDIANCE) 10 MG TABS tablet Take 1 tablet (10 mg total) by mouth daily. 12/25/19  Yes Larey Dresser, MD  Evolocumab (REPATHA SURECLICK) 010 MG/ML SOAJ Inject 1 Dose into the skin every 14 (fourteen) days. Patient taking differently: Inject 140 mg into the skin every 14 (fourteen) days. 11/29/19  Yes Hilty, Nadean Corwin, MD  hydrALAZINE (APRESOLINE) 25 MG tablet Take 1 tablet (25 mg total) by mouth 3 (three) times daily. Patient taking differently: Take 25 mg by mouth 2 (two) times daily. 04/26/20 04/26/21 Yes Larey Dresser, MD  HYDROcodone-acetaminophen (NORCO/VICODIN) 5-325 MG tablet Take 1-2 tablets by mouth every 6 (six) hours as needed for severe pain. 02/10/20  Yes Wieters, Hallie C, PA-C  minoxidil (LONITEN) 10 MG tablet TAKE 2 TABLETS(20 MG) BY MOUTH TWICE DAILY Patient taking differently: Take 10 mg by mouth 2 (two) times daily. 01/12/20  Yes Skeet Latch, MD  OVER THE COUNTER MEDICATION Place 1 drop into the left eye daily. Stye Eye Drop   Yes [provider]  pantoprazole (PROTONIX) 40 MG tablet  Take 1 tablet (40 mg total) by mouth daily. 11/28/19  Yes Larey Dresser, MD  STIOLTO RESPIMAT 2.5-2.5 MCG/ACT AERS INHALE 2 PUFFS INTO THE LUNGS DAILY 09/06/19  Yes  Spero Geralds, MD  Blood Pressure Monitor KIT 1 Device by Does not apply route 2 (two) times daily. 11/29/18   Kerin Perna, NP    Physical Exam: Vitals:   05/17/20 1715 05/17/20 1745 05/17/20 1830 05/17/20 1845  BP: (!) 152/94 (!) 154/92 140/90 (!) 157/100  Pulse: 97 100 87 93  Resp: _0 Temp:      TempSrc:      SpO2: 100% 100% 95% 95%  Weight:      Height:        Constitutional: NAD, calm, comfortable Vitals:   05/17/20 1715 05/17/20 1745 05/17/20 1830 05/17/20 1845  BP: (!) 152/94 (!) 154/92 140/90 (!) 157/100  Pulse: 97 100 87 93  Resp: _1 Temp:      TempSrc:      SpO2: 100% 100% 95% 95%  Weight:      Height:       Eyes: PERRL, left lid with stye and some edema.  ENMT: Mucous membranes are moist. Posterior pharynx clear of any exudate or lesions.partial bridge on upper teeth Neck: normal, supple, no masses, no thyromegaly Respiratory: clear to auscultation bilaterally, no wheezing, no crackles. Normal respiratory effort. No accessory muscle use.  Cardiovascular: Regular rate and rhythm, 3+ systolic murmur No extremity edema. 2+ pedal pulses. No carotid bruits.  Abdomen: mild TTP over LUQ (at baseline), no masses palpated. No hepatosplenomegaly. Bowel sounds positive.  Musculoskeletal: no clubbing / cyanosis. No joint deformity upper and lower extremities. Good ROM, no contractures. Normal muscle tone.  Skin: no rashes, lesions, ulcers. No induration Neurologic: CN 2-12 grossly intact. Sensation intact, DTR normal. Strength 5/5 in all 4.  Psychiatric: Normal judgment and insight. Alert and oriented x 3. Normal mood.    Labs on Admission: I have personally reviewed following labs and imaging studies  CBC: Recent Labs  Lab 05/15/20 1441 05/17/20 1546  WBC 5.4 5.4  HGB 12.7*  12.0*  HCT 37.8* 36.1*  MCV 95.5 94.3  PLT 106* 102*    Basic Metabolic Panel: Recent Labs  Lab 05/15/20 1441 05/17/20 1546  NA 140 139  K 4.0 3.7  CL 111 109  CO2 20* 21*  GLUCOSE 97 97  BUN 30* 33*  CREATININE 3.90* 3.92*  CALCIUM 8.9 9.0    GFR: Estimated Creatinine Clearance: 24.5 mL/min (A) (by C-G formula based on SCr of 3.92 mg/dL (H)).  Liver Function Tests: Recent Labs  Lab 05/17/20 1601  AST 17  ALT 11  ALKPHOS 70  BILITOT 0.8  PROT 7.2  ALBUMIN 3.9    Urine analysis:    Component Value Date/Time   COLORURINE YELLOW 01/31/2017 [place] 01/31/2017 1203   LABSPEC >1.046 (H) 01/31/2017 1203   PHURINE 5.0 01/31/2017 1203   GLUCOSEU NEGATIVE 01/31/2017 [place] 01/31/2017 1203   HGBUR negative 02/18/2009 0901   BILIRUBINUR NEGATIVE 01/31/2017 1203   KETONESUR NEGATIVE 01/31/2017 1203   PROTEINUR 100 (A) 01/31/2017 1203   UROBILINOGEN 0.2 01/02/2015 1945   NITRITE NEGATIVE 01/31/2017 1203   LEUKOCYTESUR NEGATIVE 01/31/2017 1203    Radiological Exams on Admission: DG Chest 2 View  Result Date: 05/17/2020 CLINICAL DATA:  Chest pain. Additional history provided: Patient reports chest pain which began several days ago, history of 2 previous myocardial infarctions. Patient reports 10/10 pain. EXAM: CHEST - 2 VIEW COMPARISON:  Prior chest radiographs 05/15/2020. FINDINGS: Prior median sternotomy with redemonstrated aortic stent graft. Prior aortic valve  replacement. A loop recorder device projects over the left chest. Unchanged cardiomegaly. No appreciable airspace consolidation or pulmonary edema. No evidence of pleural effusion or pneumothorax. No acute bony abnormality identified. Surgical clips project in the region of the right axilla. IMPRESSION: No evidence of acute cardiopulmonary abnormality. Redemonstrated postsurgical changes, as described. Unchanged cardiomegaly. Electronically Signed   By: Kellie Simmering DO   On:  05/17/2020 16:12    EKG: Independently reviewed. Sinus tachycardia. No ST elevation and no change from previous EKG  Assessment/Plan Principal Problem:   Coronary artery disease involving native coronary artery of native heart with angina pectoris [facility]) CAD: 9/19 NSTEMI with PCI to proximal ramus.  LHC in 7/20 with no obstructive disease.  Recent admission in 9/21 with Cardiolite showing no ischemia.  He has long-standing primarily non-exertional chest pain -troponin elevated from baseline on admit from 80-->136 -cardiology consulted and recommended we keep at [facility], trend troponins, start heparin drip and imdur. Thought more due CKD. He can not tolerate Imdur. Went and discussed with him and he states headache is so bad and does not want it.  -place on tele with heparin drip -troponins trended: 136--->201. Discussed with Dr. Johney Frame who agrees with afore mentioned plan of care. Continue with trending troponins. -->193.  -continue asa -cardiology following.  -placed in obs as he can likely be managed outpatient if troponins do not have significant increase.    Malignant hypertension -continue outpatient drug therapy at this time.  -recent renal duplex at cardiology this month showed mild right renal artery stenosis. No change to tx. Sleep study negative in 10/21.  -confused as to what changes were made to his HTN drugs, but reviewing cardiology note from 04/26/20 it appears he was started back on hydralazine 6m TID and continued on his norvasc, minoxidil, coreg and clonidine. He tells me he is only on hydralazine 247mBID as it gives him headaches.  -labetalol IV prn for bp control. Parameters written.    Hypertensive cardiomyopathy, without heart failure ([place]  -continue medical management and bp control.  -last echo 11/2019. EF of 60-65%    CKD (chronic kidney disease), stage IV ([place]-slowly increasing from baseline. Followed by nephrology outpatient -holding jardiance since GFR  <20 -will monitor. Hold IVF for now.   gerd -not relieved with PPI. Cardiology referred to GI 04/2020.    Hyperlipidemia LDL goal <70 -on repatha.  -lipids checked 7 months ago and to goal. (LDL 66)  Will repeat today.     Tobacco use -nicotine patch and tobacco counseling. Has cut down quite a bit.     Marijuana use -uds +MJ/opiates.     Chronic bronchitis (HCC) -albuterol prn. Do not have his stiolto respimat. brovana neb if needed.    H/O aortic valve replacement -stable. 10/03/2014 First stage median sternotomy with right axillary cannulation for aortic valved conduit root replacement with coronary reconstruction (button Bentall Procedure), 27 mm Sorin Mitroflow bovine pericardial valved conduit, total arch replacement ([id number] BavSomalialtibranch arch graft with individual head vessel reimplantation.    History of aortic dissection -type B aortic dissection. Occurred in 2/16 . 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  -- Status post open repair of an Extent III TAA secondary to chronic dissection with a # [id number] "Coselli" multibranch Dacron graft with individual celiac, SMA, and bilateral renal artery implantationon 01/04/2017, [facility].   Hx of CVA -continue medical management.     DVT prophylaxis: Heparin drip  Code Status:  Full  Disposition Plan:   Patient is from:  home  Anticipated DC to:  home  Anticipated DC date:  tomorrow  Anticipated DC barriers: none  Consults called:  Cardiology in ER, Dr. Audie Box. I spoke with Dr. Johney Frame when second tropinin resulted.  Admission status:  Observation   Severity of Illness: The appropriate patient status for this patient is OBSERVATION. Observation status is judged to be reasonable and necessary in order to provide the required intensity of service to ensure the patient's safety. The patient's presenting symptoms, physical exam findings, and initial radiographic and laboratory data in the  context of their medical condition is felt to place them at decreased risk for further clinical deterioration. Furthermore, it is anticipated that the patient will be medically stable for discharge from the hospital within 2 midnights of admission.      Orma Flaming MD [facility]  How to contact the [facility] Attending or Consulting provider [place] or covering provider during after hours [place], for this patient?   1. Check the care team in [facility] and look for a) attending/consulting [facility] provider listed and b) the [facility] team listed 2. Log into [email or web address] and use [place]'s universal password to access. If you do not have the password, please contact the hospital operator. 3. Locate the [facility] provider you are looking for under [facility] and page to a number that you can be directly reached. 4. If you still have difficulty reaching the provider, please page the [facility] (Director on Call) for the Hospitalists listed on amion for assistance.  05/17/2020, 7:56 PM

## 2020-05-17 NOTE — Progress Notes (Signed)
   05/17/20 2327  Provider Notification  Provider Name/Title Sidney Ace, MD  Date Provider Notified 05/17/20  Time Provider Notified 2327  Notification Type Page  Notification Reason Critical result  Test performed and critical result Troponin 193  Date Critical Result Received 05/17/20  Time Critical Result Received 2324  Provider response No new orders

## 2020-05-17 NOTE — Progress Notes (Signed)
   05/17/20 2145  Assess: MEWS Score  Temp 98 F (36.7 C)  BP (!) 148/90  Pulse Rate 91  ECG Heart Rate 93  Resp (!) 22  Level of Consciousness Alert  SpO2 95 %  O2 Device Room Air  Assess: MEWS Score  MEWS Temp 0  MEWS Systolic 0  MEWS Pulse 0  MEWS RR 1  MEWS LOC 0  MEWS Score 1  MEWS Score Color Green   Patient admitted from ED to 1423-01. A/O x 4. Oriented to room. Skin assessment completed with Merita Norton. Multiple scars on left knee, chest and back, otherwise skin is clean/dry/intact. Complained of chest pain, 10/10, described as intermittent, achy. Norco 2 tablet PO PRN given. On reassessment, pt is observed with eyes closed, respiration rate 20, even and unlabored. Will continue to monitor.

## 2020-05-17 NOTE — ED Provider Notes (Signed)
I provided a substantive portion of the care of this patient.  I personally performed the entirety of the history for this encounter.  EKG Interpretation  Date/Time:  Friday May 17 2020 15:12:54 EST Ventricular Rate:  116 PR Interval:    QRS Duration: 97 QT Interval:  335 QTC Calculation: 466 R Axis:   8 Text Interpretation: Sinus tachycardia Ventricular tachycardia, unsustained Biatrial enlargement LVH with secondary repolarization abnormality Anterior ST elevation, probably due to LVH no sig change from previous Confirmed by Charlesetta Shanks [phone]) on 05/17/2020 5:43:22 PM  Patient has persistent chest pain.  He has complex medical history.  He reports over the past week pain quality severity has changed and increased.  He is now getting exertional chest pain and racing heart.  Reports the pain becomes an intense pressure as well as a ache throughout his central chest and up into his shoulder and left arm.  He has history of thoracic aneurysm repair as well as coronary artery disease with stents.  Patient reports he is compliant with his blood pressure medications.  He reports despite compliance sometimes his pressures vacillate quite a bit.  Patient is alert with clear mental status.  Heart is tachycardic with 3 out of 6 systolic ejection murmur.  Murmur radiates to the back.  Lungs are clear without gross wheeze rhonchi rale.  Abdomen is soft and nondistended.  Extremities with no significant peripheral edema.  Calves soft and nontender.  Patient has significant coronary artery disease risk factors.  He has had prior stents.  Patient has hypertension and continues to smoke.  He denies drugs of abuse.  For a week he has had increasing chest pain with radiation to the left arm that is concerning for angina.  Patient was seen 2 days ago and blood pressures were elevated.  Pressures were controlled and patient's pain was improved.  There was no change in troponins.  Today patient does have a mild  troponin elevation.  At this time we will plan for admission for trending troponins and ruling out unstable angina.  Patient also has significant chronic renal insufficiency.  He has history of thoracic dissection.  Last there is duction study was done approximately 5 months ago without change.    Charlesetta Shanks, MD 05/17/20 [phone]

## 2020-05-17 NOTE — ED Provider Notes (Signed)
Patient signed out to me by [place], PA-C.  Please see previous notes for further history.  Brief, patient presented for evaluation of chest pain.  He has a history of chronic chest pain, but states it has been worse over the past couple days.  Initial troponin elevated from his baseline.  However he also has worsening kidney function, which could be contributing.  Discussed with cardiology, Dr. Davina Poke, who recommended starting heparin and admission to hospitalist with trending troponins.  Repeat troponin continues to be elevated at 200.  Heparin started.  Discussed with Dr. Rogers Blocker from [facility], patient to be admitted.   Franchot [place], PA-C 05/17/20 1841    Charlesetta Shanks, MD 05/20/20 [phone]

## 2020-05-17 NOTE — ED Triage Notes (Signed)
Pt presents with c/o chest pain that started several days ago. Pt has a hx of 2 previous MI's. Pt reports pain 10/10.

## 2020-05-17 NOTE — Progress Notes (Signed)
   05/17/20 2341  Provider Notification  Date Provider Notified 05/17/20  Time Provider Notified 2341  Notification Type Page  Notification Reason Other (Comment)  Test performed and critical result Troponin 193  Date Critical Result Received 05/17/20  Time Critical Result Received 2324  Provider response No new orders  Date of Provider Response 05/17/20  Time of Provider Response 2342   Rogers Blocker, MD aware of Troponin - trending down from admission. No new orders noted. Will continue to monitor.

## 2020-05-17 NOTE — Telephone Encounter (Signed)
LINQ loop recorder reached RRT 02/03/21. Patient will be unable to send transmission.

## 2020-05-17 NOTE — Telephone Encounter (Signed)
The patient calls in today with active CP, dizziness, and racing hear when he walks even very short distances. He had to lie down because he felt like he was going to faint. He went to the ER 2 days ago and had a "horrible experience." He states "no one knows what they're doing and it took 4 hours to be seen. Reiterated to the patient he had lab work, EKG and CXR during that time. Reiterated to him that with active CP and multiple other symptoms, the ER is the safest place to be. He declined, stating he will never go there and he will only talk to doctors who know him.  Discussed with Dr. Caryl Comes. Per Dr. Caryl Comes, informed the patient his ER results were reassuring that nothing acute is occurring.  Instructed the patient to attempt to send a remote transmission (his Linq has not been removed yet). The patient reported he put it all in a box and did not "feel like" getting it out. Reiterated to the patient that a transmission will be extremely helpful. He agrees to send.  Will send a message to the [facility] for sooner appointment as well.  The patient was grateful for call and agrees with plan.

## 2020-05-17 NOTE — Telephone Encounter (Signed)
Pt is sch to see [facility] pharmacist on Mon 3/7

## 2020-05-17 NOTE — Telephone Encounter (Signed)
Pt c/o of Chest Pain: STAT if CP now or developed within 24 hours  1. Are you having CP right now? Yes   2. Are you experiencing any other symptoms (ex. SOB, nausea, vomiting, sweating)? Palpitations, dizziness, left arm pain  3. How long have you been experiencing CP? Past 2 days, since being discharged from the hospital, per patient's wife  4. Is your CP continuous or coming and going? Continuous  5. Have you taken Nitroglycerin? No, patient does not have any nitro  Patient c/o Palpitations:  High priority if patient c/o lightheadedness, shortness of breath, or chest pain  1) How long have you had palpitations/irregular HR/ Afib? Are you having the symptoms now?  Patient has been having constant palpitations for the past 2 days/is currently having symptoms  2) Are you currently experiencing lightheadedness, SOB or CP? Chest pain, lightheadedness  3) Do you have a history of afib (atrial fibrillation) or irregular heart rhythm? Yes   4) Have you checked your BP or HR? (document readings if available):  No   5) Are you experiencing any other symptoms? No   STAT if patient feels like he/she is going to faint   1) Are you dizzy now? Yes   2) Do you feel faint or have you passed out? No, patient felt faint, but he went to lay down to avoid fainting  3) Do you have any other symptoms? No   4) Have you checked your HR and BP (record if available)? No    ?

## 2020-05-17 NOTE — Telephone Encounter (Signed)
    Pt called back, he said he couldn't send a transmission because is not working right and he is on his way to the ER

## 2020-05-17 NOTE — Progress Notes (Signed)
ANTICOAGULATION CONSULT NOTE - Initial Consult  Pharmacy Consult for heparin infusion Indication: chest pain/ACS  Allergies  Allergen Reactions  . Imdur [Isosorbide Nitrate] Other (See Comments)    Headaches   . Tramadol Other (See Comments)    Headaches     Patient Measurements: Height: 6' (182.9 cm) Weight: 74.4 kg (164 lb) IBW/kg (Calculated) : 77.6 Heparin Dosing Weight: 74.4 kg  Vital Signs: Temp: 98.7 F (37.1 C) (03/04 1513) Temp Source: Oral (03/04 1513) BP: 140/90 (03/04 1830) Pulse Rate: 87 (03/04 1830)  Labs: Recent Labs    05/15/20 1441 05/15/20 1844 05/17/20 1546 05/17/20 1716  HGB 12.7*  --  12.0*  --   HCT 37.8*  --  36.1*  --   PLT 106*  --  102*  --   CREATININE 3.90*  --  3.92*  --   TROPONINIHS 52* 65* 136* 201*    Estimated Creatinine Clearance: 24.5 mL/min (A) (by C-G formula based on SCr of 3.92 mg/dL (H)).   Medical History: Past Medical History:  Diagnosis Date  . Adenomatous colon polyp 08/2015  . Anxiety   . Aortic disease ([place])   . Aortic dissection ([facility])    a. admx 04/2014 >> L renal infarct; a/c renal failure >> b.  s/p Bioprosthetic Bentall and total arch replacement and staged endovascular repair of descending aortic aneurysm ([facility] - Dr. [name])  . CAD (coronary artery disease)    a. LHC 4/16:  oD1 60%  . Cardiomyopathy ([place])    a. non-ischemic - probably related to untreated HTN and ETOH abuse - Echo 3/13 with EF 35-40% >> b. Echo 4/16: Severe LVH, EF 55-60%, moderate AI, moderate MR, mild LAE, trivial effusion, known type B dissection with communication between true and false lumens with suprasternal images suggesting dissection plane may propagate to at least left subclavian takeoff, root above aortic valve okay    . Chronic abdominal pain   . Chronic combined systolic and diastolic congestive heart failure ([place])    a. 05/2011: Adm with pulm edema/HTN urgency, EF 35-40% with diffuse hypokinesis and moderate to severe mitral  regurgitation. Cardiomyopathy likely due to uncontrolled HTN and ETOH abuse - cath deferred due to renal insufficiency (felt due to uncontrolled HTN). bJodie Echevaria MV 06/2011: EF 37% and no ischemia or infarction. c. EF 45-50% by echo 01/2012.  [name] Chronic sinusitis   . CKD (chronic kidney disease)    a. 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  peak creatinine 3.46 during admx for aortic dissection 2/16.  sees Dr [name]  . DDD (degenerative disc disease), lumbar   . Depression   . Descending thoracic aortic aneurysm ([place])   . Dissecting aneurysm of thoracic aorta ([place])   . ETOH abuse    a. Reported to have quit 05/2011.  . Frequent headaches   . GERD (gastroesophageal reflux disease)   . Headache(784.0)    "q other day" (08/08/2013)  . Heart murmur   . Hemorrhoid thrombosis   . History of echocardiogram    Echo 1/17:  Severe LVH, EF 55-60%, no RWMA, Gr 2 DD, AVR ok, mild to mod MR, mild LAE, mild reduced RVSF, mod RAE  . History of medication noncompliance   . HYPERLIPIDEMIA   . Hypertension    a. Hx of HTN urgency secondary to noncompliance. b. urinary metanephrine and catecholeamine levels normal 2013.  c. Renal art [name] 1/16:  No evidence of renal artery stenosis noted bilaterally.  . INGUINAL HERNIA   . Pneumonia ~ 2013  .  Renal insufficiency   . Serrated adenoma of colon 08/2015  . Stroke ([place])   . Tobacco abuse   . Valvular heart disease    a. Echo 05/2011: moderate to severe eccentric MR and mild to moderate AI with prolapsing left coronary cusp. b. Echo 01/2012: mild-mod AI, mild dilitation of aortic root, mild MR.;  c. Echo 1/16: Severe LVH consistent with hypertrophic cardio myopathy, EF 50%, no RWMA, mod AI, mild MR, mild RAE, dilated Ao root (40 mm);     Medications:  No PTA anticoagulation.  Assessment: Pharmacy consulted to dose heparin infusion for this patient with chest pain.  He has a history of chronic chest pain, but states it has been worse over the past couple days.  Initial  troponin elevated from his baseline. Hemoglobin low at 12 and platelets low at 102 before initiating heparin.  Goal of Therapy:  Heparin level 0.3-0.7 units/ml Monitor platelets by anticoagulation protocol: Yes   Plan:  Obtain baseline APTT and PT/INR then Heparin 4000 units IV bolus x 1 Start IV heparin infusion at 900 units/hr Check heparin level in 6 hours and daily while on heparin Continue to monitor H&H and platelets  [name] P. Legrand Como, PharmD, [place] Please utilize Amion for appropriate phone number to reach the unit pharmacist ([place]) 05/17/2020 6:47 PM

## 2020-05-18 ENCOUNTER — Observation Stay (HOSPITAL_BASED_OUTPATIENT_CLINIC_OR_DEPARTMENT_OTHER): Payer: Medicare HMO

## 2020-05-18 DIAGNOSIS — R0789 Other chest pain: Secondary | ICD-10-CM

## 2020-05-18 DIAGNOSIS — I43 Cardiomyopathy in diseases classified elsewhere: Secondary | ICD-10-CM

## 2020-05-18 DIAGNOSIS — R079 Chest pain, unspecified: Secondary | ICD-10-CM

## 2020-05-18 DIAGNOSIS — I25119 Atherosclerotic heart disease of native coronary artery with unspecified angina pectoris: Secondary | ICD-10-CM | POA: Diagnosis not present

## 2020-05-18 DIAGNOSIS — I130 Hypertensive heart and chronic kidney disease with heart failure and stage 1 through stage 4 chronic kidney disease, or unspecified chronic kidney disease: Secondary | ICD-10-CM | POA: Diagnosis not present

## 2020-05-18 DIAGNOSIS — Z952 Presence of prosthetic heart valve: Secondary | ICD-10-CM

## 2020-05-18 DIAGNOSIS — N184 Chronic kidney disease, stage 4 (severe): Secondary | ICD-10-CM

## 2020-05-18 DIAGNOSIS — I119 Hypertensive heart disease without heart failure: Secondary | ICD-10-CM

## 2020-05-18 DIAGNOSIS — I10 Essential (primary) hypertension: Secondary | ICD-10-CM

## 2020-05-18 DIAGNOSIS — Z8679 Personal history of other diseases of the circulatory system: Secondary | ICD-10-CM

## 2020-05-18 DIAGNOSIS — I5042 Chronic combined systolic (congestive) and diastolic (congestive) heart failure: Secondary | ICD-10-CM | POA: Diagnosis not present

## 2020-05-18 DIAGNOSIS — E785 Hyperlipidemia, unspecified: Secondary | ICD-10-CM

## 2020-05-18 DIAGNOSIS — R778 Other specified abnormalities of plasma proteins: Secondary | ICD-10-CM | POA: Diagnosis not present

## 2020-05-18 DIAGNOSIS — Z20822 Contact with and (suspected) exposure to covid-19: Secondary | ICD-10-CM | POA: Diagnosis not present

## 2020-05-18 LAB — ECHOCARDIOGRAM COMPLETE
AR max vel: 1.03 cm2
AV Area VTI: 0.99 cm2
AV Area mean vel: 0.98 cm2
AV Mean grad: 31.7 mmHg
AV Peak grad: 55.8 mmHg
Ao pk vel: 3.73 m/s
Area-P 1/2: 2.99 cm2
Height: 72 in
S' Lateral: 2.58 cm
Weight: 2624 oz

## 2020-05-18 LAB — GLUCOSE, CAPILLARY: Glucose-Capillary: 92 mg/dL (ref 70–99)

## 2020-05-18 LAB — HEPARIN LEVEL (UNFRACTIONATED)
Heparin Unfractionated: 0.11 IU/mL — ABNORMAL LOW (ref 0.30–0.70)
Heparin Unfractionated: 0.35 IU/mL (ref 0.30–0.70)

## 2020-05-18 LAB — COMPREHENSIVE METABOLIC PANEL
ALT: 11 U/L (ref 0–44)
AST: 16 U/L (ref 15–41)
Albumin: 3.7 g/dL (ref 3.5–5.0)
Alkaline Phosphatase: 62 U/L (ref 38–126)
Anion gap: 6 (ref 5–15)
BUN: 32 mg/dL — ABNORMAL HIGH (ref 6–20)
CO2: 23 mmol/L (ref 22–32)
Calcium: 9 mg/dL (ref 8.9–10.3)
Chloride: 110 mmol/L (ref 98–111)
Creatinine, Ser: 4.01 mg/dL — ABNORMAL HIGH (ref 0.61–1.24)
GFR, Estimated: 18 mL/min — ABNORMAL LOW (ref >60)
Glucose, Bld: 93 mg/dL (ref 70–99)
Potassium: 3.8 mmol/L (ref 3.5–5.1)
Sodium: 139 mmol/L (ref 135–145)
Total Bilirubin: 0.9 mg/dL (ref 0.3–1.2)
Total Protein: 6.7 g/dL (ref 6.5–8.1)

## 2020-05-18 LAB — CBC
HCT: 35 % — ABNORMAL LOW (ref 39.0–52.0)
Hemoglobin: 11.5 g/dL — ABNORMAL LOW (ref 13.0–17.0)
MCH: 31.7 pg (ref 26.0–34.0)
MCHC: 32.9 g/dL (ref 30.0–36.0)
MCV: 96.4 fL (ref 80.0–100.0)
Platelets: 96 10*3/uL — ABNORMAL LOW (ref 150–400)
RBC: 3.63 MIL/uL — ABNORMAL LOW (ref 4.22–5.81)
RDW: 13.8 % (ref 11.5–15.5)
WBC: 6.2 10*3/uL (ref 4.0–10.5)
nRBC: 0 % (ref 0.0–0.2)

## 2020-05-18 LAB — TROPONIN I (HIGH SENSITIVITY)
Troponin I (High Sensitivity): 159 ng/L (ref <18)
Troponin I (High Sensitivity): 150 ng/L (ref <18)

## 2020-05-18 MED ORDER — NITROGLYCERIN 0.4 MG SL SUBL
SUBLINGUAL_TABLET | SUBLINGUAL | Status: AC
  Administered 2020-05-18: 0.4 mg
  Filled 2020-05-18: qty 1

## 2020-05-18 MED ORDER — NITROGLYCERIN 0.4 MG SL SUBL
SUBLINGUAL_TABLET | SUBLINGUAL | Status: AC
  Filled 2020-05-18: qty 1

## 2020-05-18 MED ORDER — LIDOCAINE 5 % EX PTCH
1.0000 | MEDICATED_PATCH | CUTANEOUS | Status: DC
  Administered 2020-05-18 – 2020-05-19 (×2): 1 via TRANSDERMAL
  Filled 2020-05-18 (×2): qty 1

## 2020-05-18 MED ORDER — ATORVASTATIN CALCIUM 40 MG PO TABS
80.0000 mg | ORAL_TABLET | Freq: Every day | ORAL | Status: DC
  Administered 2020-05-19: 80 mg via ORAL
  Filled 2020-05-18 (×3): qty 2

## 2020-05-18 MED ORDER — NITROGLYCERIN 0.4 MG/SPRAY TL SOLN: 1.0000 | Status: DC | PRN

## 2020-05-18 MED ORDER — ATORVASTATIN CALCIUM 40 MG PO TABS
80.0000 mg | ORAL_TABLET | Freq: Every day | ORAL | Status: DC
  Administered 2020-05-18: 80 mg via ORAL
  Filled 2020-05-18: qty 2

## 2020-05-18 MED ORDER — ROSUVASTATIN CALCIUM 20 MG PO TABS: 40.0000 mg | ORAL_TABLET | Freq: Every day | ORAL | Status: DC

## 2020-05-18 MED ORDER — NITROGLYCERIN 2 % TD OINT
1.0000 [in_us] | TOPICAL_OINTMENT | Freq: Once | TRANSDERMAL | Status: AC
  Administered 2020-05-18: 1 [in_us] via TOPICAL
  Filled 2020-05-18 (×2): qty 30

## 2020-05-18 MED ORDER — MORPHINE SULFATE (PF) 2 MG/ML IV SOLN
2.0000 mg | INTRAVENOUS | Status: DC | PRN
  Filled 2020-05-18: qty 1

## 2020-05-18 MED ORDER — HEPARIN BOLUS VIA INFUSION
2000.0000 [IU] | Freq: Once | INTRAVENOUS | Status: AC
  Administered 2020-05-18: 2000 [IU] via INTRAVENOUS
  Filled 2020-05-18: qty 2000

## 2020-05-18 NOTE — Progress Notes (Signed)
  Echocardiogram 2D Echocardiogram has been performed.  [name] [name] 05/18/2020, 10:01 AM

## 2020-05-18 NOTE — Progress Notes (Signed)
   05/18/20 0418  Assess: MEWS Score  Temp 98.7 F (37.1 C)  BP (!) 153/92  Pulse Rate (!) 109  Resp (!) 24  Level of Consciousness Alert  SpO2 100 %  O2 Device Nasal Cannula  Patient Activity (if Appropriate) In bed  O2 Flow Rate (L/min) 2 L/min  Assess: MEWS Score  MEWS Temp 0  MEWS Systolic 0  MEWS Pulse 1  MEWS RR 1  MEWS LOC 0  MEWS Score 2  MEWS Score Color Yellow  Assess: if the MEWS score is Yellow or Red  Were vital signs taken at a resting state? Yes  Focused Assessment No change from prior assessment  Early Detection of Sepsis Score *See Row Information* Low  MEWS guidelines implemented *See Row Information* No, previously red, continue vital signs every 4 hours  Treat  Pain Scale 0-10  Pain Score 10  Pain Type Chronic pain  Pain Location Chest  Pain Orientation Left  Pain Radiating Towards arm  Pain Descriptors / Indicators Pressure  Pain Frequency Occasional  Pain Onset On-going  Patients Stated Pain Goal 3  Pain Intervention(s) Medication (See eMAR);RN made aware  Take Vital Signs  Increase Vital Sign Frequency  Yellow: Q 2hr X 2 then Q 4hr X 2, if remains yellow, continue Q 4hrs  Escalate  MEWS: Escalate Yellow: discuss with charge nurse/RN and consider discussing with provider and RRT  Notify: Charge Nurse/RN  Name of Charge Nurse/RN Notified catherine conrad  Date Charge Nurse/RN Notified 05/18/20  Time Charge Nurse/RN Notified 0430  Notify: Provider  Provider Name/Title Mansy  Date Provider Notified 05/18/20  Time Provider Notified 0430  Notification Type Page  Notification Reason Change in status;Critical result  Provider response At bedside  Date of Provider Response 05/18/20  Time of Provider Response 0806  Notify: Rapid Response  Name of Rapid Response RN Notified Wells Guiles, RN  Date Rapid Response Notified 05/18/20  Time Rapid Response Notified 0425

## 2020-05-18 NOTE — Progress Notes (Signed)
[place] for heparin infusion Indication: chest pain/ACS  Allergies  Allergen Reactions  . Imdur [Isosorbide Nitrate] Other (See Comments)    Headaches   . Tramadol Other (See Comments)    Headaches     Patient Measurements: Height: 6' (182.9 cm) Weight: 74.4 kg (164 lb) IBW/kg (Calculated) : 77.6 Heparin Dosing Weight: 74.4 kg  Vital Signs: Temp: 98.9 F (37.2 C) (03/05 0205) Temp Source: Oral (03/05 0205) BP: 140/68 (03/05 0205) Pulse Rate: 91 (03/05 0205)  Labs: Recent Labs    05/15/20 1441 05/15/20 1844 05/17/20 1546 05/17/20 1716 05/17/20 1849 05/17/20 2141 05/18/20 0150  HGB 12.7*  --  12.0*  --   --   --  11.5*  HCT 37.8*  --  36.1*  --   --   --  35.0*  PLT 106*  --  102*  --   --   --  96*  APTT  --   --   --   --  33  --   --   LABPROT  --   --   --   --  13.3  --   --   INR  --   --   --   --  1.1  --   --   HEPARINUNFRC  --   --   --   --   --   --  0.11*  CREATININE 3.90*  --  3.92*  --   --   --  4.01*  TROPONINIHS 52*   < > 136* 201*  --  193*  --    < > = values in this interval not displayed.    Estimated Creatinine Clearance: 24 mL/min (A) (by C-G formula based on SCr of 4.01 mg/dL (H)).   Medical History: Past Medical History:  Diagnosis Date  . Adenomatous colon polyp 08/2015  . Anxiety   . Aortic disease ([place])   . Aortic dissection ([facility])    a. admx 04/2014 >> L renal infarct; a/c renal failure >> b.  s/p Bioprosthetic Bentall and total arch replacement and staged endovascular repair of descending aortic aneurysm ([facility] - Dr. [name])  . CAD (coronary artery disease)    a. LHC 4/16:  oD1 60%  . Cardiomyopathy ([place])    a. non-ischemic - probably related to untreated HTN and ETOH abuse - Echo 3/13 with EF 35-40% >> b. Echo 4/16: Severe LVH, EF 55-60%, moderate AI, moderate MR, mild LAE, trivial effusion, known type B dissection with communication between true and false lumens with suprasternal images  suggesting dissection plane may propagate to at least left subclavian takeoff, root above aortic valve okay    . Chronic abdominal pain   . Chronic combined systolic and diastolic congestive heart failure ([place])    a. 05/2011: Adm with pulm edema/HTN urgency, EF 35-40% with diffuse hypokinesis and moderate to severe mitral regurgitation. Cardiomyopathy likely due to uncontrolled HTN and ETOH abuse - cath deferred due to renal insufficiency (felt due to uncontrolled HTN). bJodie Echevaria MV 06/2011: EF 37% and no ischemia or infarction. c. EF 45-50% by echo 01/2012.  [name] Chronic sinusitis   . CKD (chronic kidney disease)    a. 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  peak creatinine 3.46 during admx for aortic dissection 2/16.  sees Dr [name]  . DDD (degenerative disc disease), lumbar   . Depression   . Descending thoracic aortic aneurysm ([place])   . Dissecting aneurysm of thoracic aorta ([place])   .  ETOH abuse    a. Reported to have quit 05/2011.  . Frequent headaches   . GERD (gastroesophageal reflux disease)   . Headache(784.0)    "q other day" (08/08/2013)  . Heart murmur   . Hemorrhoid thrombosis   . History of echocardiogram    Echo 1/17:  Severe LVH, EF 55-60%, no RWMA, Gr 2 DD, AVR ok, mild to mod MR, mild LAE, mild reduced RVSF, mod RAE  . History of medication noncompliance   . HYPERLIPIDEMIA   . Hypertension    a. Hx of HTN urgency secondary to noncompliance. b. urinary metanephrine and catecholeamine levels normal 2013.  c. Renal art [name] 1/16:  No evidence of renal artery stenosis noted bilaterally.  . INGUINAL HERNIA   . Pneumonia ~ 2013  . Renal insufficiency   . Serrated adenoma of colon 08/2015  . Stroke ([place])   . Tobacco abuse   . Valvular heart disease    a. Echo 05/2011: moderate to severe eccentric MR and mild to moderate AI with prolapsing left coronary cusp. b. Echo 01/2012: mild-mod AI, mild dilitation of aortic root, mild MR.;  c. Echo 1/16: Severe LVH consistent with hypertrophic cardio  myopathy, EF 50%, no RWMA, mod AI, mild MR, mild RAE, dilated Ao root (40 mm);     Medications:  No PTA anticoagulation.  Assessment: Pharmacy consulted to dose heparin infusion for chest pain.  He has a history of chronic chest pain, but states it has been worse over the past couple days.  Initial troponin elevated from his baseline. Hemoglobin low at 12 and platelets low at 102 before initiating heparin. Not on anticoagulation; coags (INR, aPTT) at baseline.   05/18/2020:  Initial heparin level 0.11- subtherapeutic on heparin 900 units/hr  Hg/pltc low but stable  No bleeding or infusion related concerns per RN  Scr elevated  Goal of Therapy:  Heparin level 0.3-0.7 units/ml Monitor platelets by anticoagulation protocol: Yes   Plan:  Re-bolus Heparin 2000 units IV x 1 Increase IV heparin infusion to 1200 units/hr Recheck heparin level in 8 hours and daily while on heparin Continue to monitor H&H and platelets  Netta Cedars, PharmD, BCPS 05/18/2020@2 :44 AM

## 2020-05-18 NOTE — Progress Notes (Signed)
Rapid Response Event Note   Reason for Call : Pt c/o CP 10/10   Initial Focused Assessment: Pt found lying in bed with facial grimaces.  Pt doesn't seem SOB.  However, 2 Lnc in place.  Pt states "I have had this pain before for a bout a week and that's why I came to the hospital". Pt A/O x 4 and follows commands. Pt states "My chest pain is hurting in my arm too."  Interventions: Pt given nitro SL per primary RN.  Pt placed on monitor, see chart for VS.  Pt cbg wnl.  EKG sent to CARDS and update on pt status to evaluate.  Pt given prn Labetolol and MSO4 per MD.  After meds given pt did become pain free.  Troponin ordered per MD.   Plan of Care: AM Cardiology MD will see pt this am.  Pt will remain in current room at this time. Nursing made aware to call if any issues.   Event Summary:   MD Notified: yes Call Time: 0423 Arrival Time: 1610 End Time: [id number]  Dyann Ruddle, RN

## 2020-05-18 NOTE — Progress Notes (Signed)
Patient c/o chest pain 10/10 radiating to left arm. C/o Nausea and dull pressure mid chest. Started the patient on 2L O2. VS at this time were  152/92, 109, 100% on 2 L, 24 RR, 98.7. Gave 1 SL nitroglycerin and Completed a 12 Lead EKG. Rapid response made aware of chief complaint. Rapid response nurse came to bedside to assess the patient. Contacting rounding cardiologist for additional orders and concerns. Will cont to monitor patient with RR nurse.[name]

## 2020-05-18 NOTE — Progress Notes (Signed)
PROGRESS NOTE    [name]  PYP:[id number] DOB: 1970-08-19 DOA: 05/17/2020 PCP: Kerin Perna, NP   Brief Narrative: HPI per Dr. Rogers Blocker 05/17/2020 [name] is a 50 y.o. male with medical history significant for CAD, NSTEMI 11/2017 with LHC with stent placed in proximal ramus. Repeat cath in 7/20 with nonobstructive disease, AAA repair in 2016 at [facility], open repair of TAAA secondary to chronic dissection with multibranch dacron graft 12/2016, CVA 2017, bioprothetic AVR 2016, HLD, CKD stage IV, malignant HTN, tobacco abuse and HTN cardiomyopathy. Last echo 9/21. Also history GERD not relieved with PPI therapy.  He has history of chronic chest pain for the past 5 years, but reports he has had worsening chest pain over the last week. Pain starts in his abdomen and goes up to his chest and radiates down his left arm. Pain is constant in nature and described as pressure. Pain rated as a 10/10 and is sharp in nature. He states he has chronic chest pain but this pain was much more intense so he came to hospital. He states he will have occssional shortness of breath and light headedness and at times nausea. If he takes tylenol and lays back he feels better and it gets worse with exertion (new for him). He feels like his heart is racing. He denies any drug use, but does smoke. He is an occasional drinker and smokes MJ. He has chronic hx of headaches which is at his baseline. No vision changes, vomiting, coughing, diarrhea, leg swelling.   ED Course: ON arrival vitals stable with bp of 148/94, HR 96, afebrile, RR of 18 and oxygen of 100% on room air. eKG done which showed no significant changes from previous ekg, no ST elevation and sinus tachycardia. Significant labs showed a troponin of 136 (baseline around 60-80) and worsening creatinine to 3.92 with slow trend upwards over last few months. CXR stable. Unchanged cardiomegaly. ER consulted cardiology with elevated tropoinins (dr. Marisue Ivan). Recommends admit  here, trend troponins and heparin/imdur. He was given dose of labetalol for BP control and pain meds.   Assessment & Plan:   Principal Problem:   Coronary artery disease involving native coronary artery of native heart with angina pectoris ([place]) Active Problems:   Hyperlipidemia LDL goal <70   Tobacco use   Marijuana use   Chronic bronchitis (HCC)   Hypertensive cardiomyopathy, without heart failure ([place]): EF reduced to 35-40% from 55%.   Malignant hypertension   CKD (chronic kidney disease), stage IV (HCC)   H/O aortic valve replacement   History of aortic dissection   #1  Chest pain/elevated troponin/CAD/PCI-patient admitted with 10 out of 10 chest pain radiating to his left upper extremity.  This pain was resolved with nitrates and morphine. His troponin peaked at 201 and is trending down, elevated troponin in the setting of CKD. Continue heparin drip per cardiology. Echo done results pending. He is refusing Imdur due to severe headaches.   #2 CKD stage IV-creatinine 4.01 up from 3.92.  Monitor closely.  #3 history of bioprosthetic AVR/aortic dissection with total arch replacement and endovascular repair of descending aorta  #4 history of of malignant hypertension-with mild right renal artery stenosis.  It does not appear that he is compliant to his medications at home.  #5 hyperlipidemia LDL is 55 on Repatha and statin triglycerides 37  #6 polysubstance abuse urine tox screen positive for marijuana and opiates  #7 GERD he is supposed to follow-up with GI  #8 tobacco abuse  continue nicotine patch discussed importance of quitting.  #9 history of stroke  #10 thrombocytopenia on aspirin and heparin drip platelets have been trending downwards monitor closely.   Estimated body mass index is 22.24 kg/m as calculated from the following:   Height as of this encounter: 6' (1.829 m).   Weight as of this encounter: 74.4 kg.  DVT prophylaxis: Heparin drip Code Status: Full  code Family Communication: None at bedside Disposition Plan:  Status is: Observation  Dispo: The patient is from: Home              Anticipated d/c is to: Home              Patient currently is not medically stable to d/c.   Difficult to place patient na    Consultants: Cardiology  Procedures: None Antimicrobials: None Subjective: Patient was resting in bed chest pain-free when I saw him this morning however patient had 10 out of 10 chest pain during the early morning hours relieved with morphine and nitroglycerin.  Objective: Vitals:   05/18/20 0205 05/18/20 0418 05/18/20 0435 05/18/20 0632  BP: 140/68 (!) 153/92 (!) 154/97 138/63  Pulse: 91 (!) 109 (!) 109 94  Resp: 20 (!) 24 (!) 24 18  Temp: 98.9 F (37.2 C) 98.7 F (37.1 C)  (!) 97.4 F (36.3 C)  TempSrc: Oral Oral  Oral  SpO2: 94% 100%  96%  Weight:      Height:        Intake/Output Summary (Last 24 hours) at 05/18/2020 0946 Last data filed at 05/18/2020 0657 Gross per 24 hour  Intake 240 ml  Output 800 ml  Net -560 ml   Filed Weights   05/17/20 1513  Weight: 74.4 kg    Examination:  General exam: Appears calm and comfortable  Respiratory system: Clear to auscultation. Respiratory effort normal. Cardiovascular system: S1 & S2 heard, RRR.systolic murmer Gastrointestinal system: Abdomen is nondistended, soft and mild generalized tender. No organomegaly or masses felt. Normal bowel sounds heard. Central nervous system: Alert and oriented. No focal neurological deficits. Extremities: Symmetric 5 x 5 power. Skin: No rashes, lesions or ulcers Psychiatry: Judgement and insight appear normal. Mood & affect appropriate.     Data Reviewed: I have personally reviewed following labs and imaging studies  CBC: Recent Labs  Lab 05/15/20 1441 05/17/20 1546 05/18/20 0150  WBC 5.4 5.4 6.2  HGB 12.7* 12.0* 11.5*  HCT 37.8* 36.1* 35.0*  MCV 95.5 94.3 96.4  PLT 106* 102* 96*   Basic Metabolic Panel: Recent Labs   Lab 05/15/20 1441 05/17/20 1546 05/18/20 0150  NA 140 139 139  K 4.0 3.7 3.8  CL 111 109 110  CO2 20* 21* 23  GLUCOSE 97 97 93  BUN 30* 33* 32*  CREATININE 3.90* 3.92* 4.01*  CALCIUM 8.9 9.0 9.0   GFR: Estimated Creatinine Clearance: 24 mL/min (A) (by C-G formula based on SCr of 4.01 mg/dL (H)). Liver Function Tests: Recent Labs  Lab 05/17/20 1601 05/18/20 0150  AST 17 16  ALT 11 11  ALKPHOS 70 62  BILITOT 0.8 0.9  PROT 7.2 6.7  ALBUMIN 3.9 3.7   Recent Labs  Lab 05/17/20 1601  LIPASE 43   No results for input(s): AMMONIA in the last 168 hours. Coagulation Profile: Recent Labs  Lab 05/17/20 1849  INR 1.1   Cardiac Enzymes: No results for input(s): CKTOTAL, CKMB, CKMBINDEX, TROPONINI in the last 168 hours. BNP (last 3 results) No results for input(s): PROBNP  in the last 8760 hours. HbA1C: No results for input(s): HGBA1C in the last 72 hours. CBG: No results for input(s): GLUCAP in the last 168 hours. Lipid Profile: Recent Labs    05/17/20 2141  CHOL 119  HDL 57  LDLCALC 55  TRIG 37  CHOLHDL 2.1   Thyroid Function Tests: Recent Labs    05/17/20 2141  TSH 2.804   Anemia Panel: No results for input(s): VITAMINB12, FOLATE, FERRITIN, TIBC, IRON, RETICCTPCT in the last 72 hours. Sepsis Labs: No results for input(s): PROCALCITON, LATICACIDVEN in the last 168 hours.  Recent Results (from the past 240 hour(s))  Resp Panel by RT-PCR (Flu A&B, Covid) Nasopharyngeal Swab     Status: None   Collection Time: 05/17/20  5:47 PM   Specimen: Nasopharyngeal Swab; Nasopharyngeal(NP) swabs in vial transport medium  Result Value Ref Range Status   SARS Coronavirus 2 by RT PCR NEGATIVE NEGATIVE Final    Comment: (NOTE) SARS-CoV-2 target nucleic acids are NOT DETECTED.  The SARS-CoV-2 RNA is generally detectable in upper respiratory specimens during the acute phase of infection. The lowest concentration of SARS-CoV-2 viral copies this assay can detect is 138  copies/mL. A negative result does not preclude SARS-Cov-2 infection and should not be used as the sole basis for treatment or other patient management decisions. A negative result may occur with  improper specimen collection/handling, submission of specimen other than nasopharyngeal swab, presence of viral mutation(s) within the areas targeted by this assay, and inadequate number of viral copies(<138 copies/mL). A negative result must be combined with clinical observations, patient history, and epidemiological information. The expected result is Negative.  Fact Sheet for Patients:  EntrepreneurPulse.com.au  Fact Sheet for Healthcare Providers:  IncredibleEmployment.be  This test is no t yet approved or cleared by the [place] FDA and  has been authorized for detection and/or diagnosis of SARS-CoV-2 by FDA under an Emergency Use Authorization (EUA). This EUA will remain  in effect (meaning this test can be used) for the duration of the COVID-19 declaration under Section 564(b)(1) of the Act, 21 U.S.C.section 360bbb-3(b)(1), unless the authorization is terminated  or revoked sooner.       Influenza A by PCR NEGATIVE NEGATIVE Final   Influenza B by PCR NEGATIVE NEGATIVE Final    Comment: (NOTE) The Xpert Xpress SARS-CoV-2/FLU/RSV plus assay is intended as an aid in the diagnosis of influenza from Nasopharyngeal swab specimens and should not be used as a sole basis for treatment. Nasal washings and aspirates are unacceptable for Xpert Xpress SARS-CoV-2/FLU/RSV testing.  Fact Sheet for Patients: EntrepreneurPulse.com.au  Fact Sheet for Healthcare Providers: IncredibleEmployment.be  This test is not yet approved or cleared by the [place] FDA and has been authorized for detection and/or diagnosis of SARS-CoV-2 by FDA under an Emergency Use Authorization (EUA). This EUA will remain in effect (meaning  this test can be used) for the duration of the COVID-19 declaration under Section 564(b)(1) of the Act, 21 U.S.C. section 360bbb-3(b)(1), unless the authorization is terminated or revoked.  Performed at [facility], [place] [address], [place], [place] [address]          Radiology Studies: DG Chest 2 View  Result Date: 05/17/2020 CLINICAL DATA:  Chest pain. Additional history provided: Patient reports chest pain which began several days ago, history of 2 previous myocardial infarctions. Patient reports 10/10 pain. EXAM: CHEST - 2 VIEW COMPARISON:  Prior chest radiographs 05/15/2020. FINDINGS: Prior median sternotomy with redemonstrated aortic stent graft. Prior aortic  valve replacement. A loop recorder device projects over the left chest. Unchanged cardiomegaly. No appreciable airspace consolidation or pulmonary edema. No evidence of pleural effusion or pneumothorax. No acute bony abnormality identified. Surgical clips project in the region of the right axilla. IMPRESSION: No evidence of acute cardiopulmonary abnormality. Redemonstrated postsurgical changes, as described. Unchanged cardiomegaly. Electronically Signed   By: Kellie Simmering DO   On: 05/17/2020 16:12        Scheduled Meds: . amitriptyline  50 mg Oral QHS  . amLODipine  10 mg Oral Daily  . aspirin EC  81 mg Oral Daily  . [START ON 05/19/2020] atorvastatin  80 mg Oral Daily  . carvedilol  25 mg Oral BID WC  . cloNIDine  0.3 mg Transdermal Weekly  . doxazosin  16 mg Oral QHS  . hydrALAZINE  25 mg Oral BID  . minoxidil  10 mg Oral BID  . nicotine  14 mg Transdermal Q24H  . nitroGLYCERIN      . sodium chloride flush  3 mL Intravenous Q12H   Continuous Infusions: . sodium chloride    . heparin 1,200 Units/hr (05/18/20 0300)     LOS: 0 days     Georgette Shell, MD  05/18/2020, 9:46 AM

## 2020-05-18 NOTE — Progress Notes (Incomplete)
***In Progress*** PCP:  Kerin Perna, NP          Cardiologist:  Dr. Aundra Dubin  HPI:  [name] is a 50 y.o. 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 The descending thoracic aorta has dilated to 5.1 cm. Cardiac catheterization in April 2016 demonstrated 60% first diagonal lesion. 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 He has had intermittent chest discomfort since then.  Admitted in 12/02/2017 chest pain. Had NSTEMI. He had LHC with stent placed proximal ramus.  Repeat cath in 7/20 with nonobstructive disease. ECHO with EF 35-40% with severe LVH and concern for infiltrative process.Myeloma panel negative and PYP scan negative, suspected low EF due more likely to HTN than hypertrophic cardiomyopathy. Could not get cardiac MRI with elevated creatinine.    He was admitted in 9/21 with atypical chest pain.  Echo showed EF 60-65% with severe LVH, normal RV, bioprosthetic aortic valve with mean gradient 18 mmHg.  Cardiolite showed no ischemia.   He recently presented for followup of CAD, HTN with Dr. Aundra Dubin on 04/26/20.  He still smoked but was down to a few cigarettes/day.  Main complaint was chest pain.  This had been present now for years.  It usually presents at rest, often when he is lying in bed and radiates to his upper back.  Chest pain is not worse with exertion.  He also gets GERD symptoms and tastes acid in his mouth.  Protonix  does not totally resolve the GERD symptoms.  No exertional dyspnea, orthopnea/PND, palpitations.  BP remains elevated.  He was no longer taking hydralazine as he thought that it may have caused a headache. Hydralazine 25 mg TID was restarted at this visit.  Admitted 05/17/20-05/19/20 with chest pain. EKG did not reveal any acute changes and his pain was thought to be secondary to muscular/chest wall/rib pain. ECHO showed LVEF 60-65% with severe LVH and grade 2 diastolic dysfunction. Small pericardial effusion with no evidence of tamponade.  Today he returns to HF clinic for pharmacist medication titration. At discharge, all PTA home medications were continued with the addition of atorvastatin 80 mg.   160/90, 88, 163 lbs A - Increase hydralazine to 25 tid vs 50 bid (got it 25 bid while admitted) - Stop jardiance? Scr 4.01, GFR 18 F/u [facility] Clinic 3/28  Overall feeling ***. Dizziness, lightheadedness, fatigue:  Chest pain or palpitations:  How is your breathing?: *** SOB: Able to complete all ADLs. Activity level ***  Weight at home pounds. Takes furosemide/torsemide/bumex *** mg *** daily.  LEE PND/Orthopnea  Appetite *** Low-salt diet:   Physical Exam Cost/affordability of meds   HF Medications: Carvedilol 25 mg BID Jardiance 10 mg daily Hydralazine 25 mg TID  HTN Medications: Amlodipine 10 mg daily Clonidine 0.3 mg patch once weekly Doxazosin 16 mg QHS Minoxidil 20 mg BID  Has the patient been experiencing any side effects to the medications prescribed?  {YES NO:22349}  Does the patient have any problems obtaining medications due to  transportation or finances?   {YES NO:22349} - Has [employer] Medicare  Understanding of regimen: {excellent/good/fair/poor:19665} Understanding of indications: {excellent/good/fair/poor:19665} Potential of compliance: {excellent/good/fair/poor:19665} Patient understands to avoid NSAIDs. Patient understands to avoid decongestants.    Pertinent  Lab Values (05/18/20): [name] Serum creatinine 4.01, BUN 32, Potassium 3.8, Sodium 139  Vital Signs: . Weight: *** (last clinic weight: 163 lbs) . Blood pressure: ***  . Heart rate: ***   Assessment/Plan: 1. CAD: 9/19 NSTEMI with PCI to proximal ramus.  LHC in 7/20 with no obstructive disease.  Recent admission in 9/21 with Cardiolite showing no ischemia.  He has long-standing primarily non-exertional chest pain.  I suspect that it may be musculoskeletal and related to his prior surgeries.  GERD seems to be a contributor to his chest pain.  ECG unchanged.  - Refer to GI as PPI does not totally resolve GERD-type symptoms.  - Continue ASA 81 - Continue atorvastatin 80 mg daily - He is on Repatha via the lipid clinic.  - With long-standing chest and upper back pain, prior plans noted to get CXR PA/lateral.   2. 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  Most recent echo on 05/18/20 showed EF 60-65%, severe LVH, normal RV.He is not volume overloaded on exam and is not using furosemide. NYHA class I-II symptoms.  - Continue carvedilol 25 mg BID - Continue Jardiance 10 mg daily *** - Continue Hydralazine 25 mg TID/BID*** - He is off spironolactone and Entresto with CKD stage IV.   3. HTN: Still needs better control.  Sleep study in 10/21 was negative. Renal artery dopplers on 05/14/20 with mild right renal artery stenosis - Continue carvedilol 25 mg BID - Continue hydralazine 25 mg TID/BID *** - Continue amlodipine 10 mg daily - Continue minoxidil 20 mg BID - Continue clonidine 0.3 mg patch once weekly - Continue doxazosin 16 mg QHS - He does not have other good options for antihypertensives.   4. S/P Bioprothetic AVR: Echo on 05/18/20 with elevated mean gradient (32  mmHg)  5. 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 [name] at [facility].   6. CKD Stage IIIb-IV: Creatinine down to 2.96 in 10/21.  - Has followup with nephrology.   7.Active smoker: He is cutting back, again recommended cessation.  Richardine Service, PharmD, [place] PGY2 Cardiology Pharmacy Resident  Audry Riles, PharmD, BCPS, [facility], CPP Heart Failure Clinic Pharmacist [phone]

## 2020-05-18 NOTE — Consult Note (Signed)
Cardiology Consultation:   Patient ID: [name] MRN: [id number]; DOB: 1970-05-21  Admit date: 05/17/2020 Date of Consult: 05/18/2020  PCP:  Kerin Perna, NP   [place]  Cardiologist:  No primary care provider on file.  Advanced Practice Provider:  No care team member to display Electrophysiologist:  None  Advanced [facility] Failure Clinic:  [name], MD    {   Patient Profile:   [name] is a 49 y.o. 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 Rogers Blocker.  History of Present Illness:   [name] has a complex cardiovascular history, reviewed today. He is followed by Dr. Aundra Dubin as an outpatient.   On presentation, he reported progressive chest pain, worse than his chronic pain. It is now exertional. Also with intermittent palpations. His pain prior to presentation in the ER was 10/10, radiating to left shoulder. His chronic pain is a constant, pressure like sensation. This is a sharp, more intense pain that his baseline. Associated with nausea and shortness of breath, no diaphoresis, no lightheadedness. Worse with exertion, better with tylenol.  He tells me that he always has chronic chest pain and tenderness, but recently he had a severe sharp 10/10 pain that went down his left arm. It was better when he put his arm over his head and then would return when he placed his arm at his side. Worse when he was moving/walking. Better with pain medication.  He continues to have his chronic pain. He has chronic broken ribs that cause him significant discomfort, as well as tenderness chronically at his sternal site.  We reviewed his echo and options for  management, see below.  Denies PND, orthopnea, LE edema or unexpected weight gain. No syncope.   Past Medical History:  Diagnosis Date  . Adenomatous colon polyp 08/2015  . Anxiety   . Aortic disease ([place])   . Aortic dissection ([facility])    a. admx 04/2014 >> L renal infarct; a/c renal failure >> b.  s/p Bioprosthetic Bentall and total arch replacement and staged endovascular repair of descending aortic aneurysm ([facility] - Dr. [name])  . CAD (coronary artery disease)    a. LHC 4/16:  oD1 60%  . Cardiomyopathy ([place])    a. non-ischemic - probably related to untreated HTN and ETOH abuse - Echo 3/13 with EF 35-40% >> b. Echo 4/16: Severe LVH, EF 55-60%, moderate AI, moderate MR, mild LAE, trivial effusion, known type B dissection with communication between true and false lumens with suprasternal images suggesting dissection plane may propagate to at least left subclavian takeoff, root above aortic valve okay    . Chronic abdominal pain   . Chronic combined systolic and diastolic congestive heart failure ([place])    a. 05/2011: Adm with pulm edema/HTN urgency, EF 35-40% with diffuse hypokinesis and moderate to severe mitral regurgitation. Cardiomyopathy likely due to uncontrolled HTN and ETOH abuse - cath deferred due to renal insufficiency (felt due to uncontrolled HTN). bJodie Echevaria MV 06/2011: EF 37% and no ischemia or infarction. c. EF 45-50% by echo 01/2012.  [name] Chronic sinusitis   . CKD (chronic kidney disease)    a. 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  peak  creatinine 3.46 during admx for aortic dissection 2/16.  sees Dr [name]  . DDD (degenerative disc disease), lumbar   . Depression   . Descending thoracic aortic aneurysm ([place])   . Dissecting aneurysm of thoracic aorta ([place])   . ETOH abuse    a. Reported to have quit 05/2011.  . Frequent headaches   . GERD (gastroesophageal reflux disease)   . Headache(784.0)    "q other day" (08/08/2013)  . Heart murmur   . Hemorrhoid thrombosis   . History of  echocardiogram    Echo 1/17:  Severe LVH, EF 55-60%, no RWMA, Gr 2 DD, AVR ok, mild to mod MR, mild LAE, mild reduced RVSF, mod RAE  . History of medication noncompliance   . HYPERLIPIDEMIA   . Hypertension    a. Hx of HTN urgency secondary to noncompliance. b. urinary metanephrine and catecholeamine levels normal 2013.  c. Renal art [name] 1/16:  No evidence of renal artery stenosis noted bilaterally.  . INGUINAL HERNIA   . Pneumonia ~ 2013  . Renal insufficiency   . Serrated adenoma of colon 08/2015  . Stroke ([place])   . Tobacco abuse   . Valvular heart disease    a. Echo 05/2011: moderate to severe eccentric MR and mild to moderate AI with prolapsing left coronary cusp. b. Echo 01/2012: mild-mod AI, mild dilitation of aortic root, mild MR.;  c. Echo 1/16: Severe LVH consistent with hypertrophic cardio myopathy, EF 50%, no RWMA, mod AI, mild MR, mild RAE, dilated Ao root (40 mm);     Past Surgical History:  Procedure Laterality Date  . ANKLE SURGERY Bilateral    Fractures bilaterally  . AORTIC VALVE SURGERY  09/2014  . CORONARY ANGIOGRAPHY N/A 12/06/2017   Procedure: CORONARY ANGIOGRAPHY;  Surgeon: Belva Crome, MD;  Location: [place] [facility];  Service: Cardiovascular;  Laterality: N/A;  . CORONARY ANGIOGRAPHY N/A 09/26/2018   Procedure: CORONARY ANGIOGRAPHY;  Surgeon: Nelva Bush, MD;  Location: [place] [facility];  Service: Cardiovascular;  Laterality: N/A;  . CORONARY STENT INTERVENTION N/A 12/10/2017   Procedure: CORONARY STENT INTERVENTION;  Surgeon: [name] Sine, MD;  Location: [place] [facility];  Service: Cardiovascular;  Laterality: N/A;  . FOOT FRACTURE SURGERY Bilateral 2004-2010   "got pins in both of them"  . HEMORRHOID SURGERY N/A 06/15/2015   Procedure: HEMORRHOIDECTOMY;  Surgeon: Stark Klein, MD;  Location: [place];  Service: General;  Laterality: N/A;  . INGUINAL HERNIA REPAIR Right ~ 1996  . LEFT HEART CATHETERIZATION WITH CORONARY ANGIOGRAM N/A 06/21/2014    Procedure: LEFT HEART CATHETERIZATION WITH CORONARY ANGIOGRAM;  Surgeon: Larey Dresser, MD;  Location[facility] CATH LAB;  Service: Cardiovascular;  Laterality: N/A;  . LOOP RECORDER INSERTION N/A 06/19/2016   Procedure: Loop Recorder Insertion;  Surgeon: Deboraha Sprang, MD;  Location: [place] [facility];  Service: Cardiovascular;  Laterality: N/A;  . TEE WITHOUT CARDIOVERSION N/A 03/12/2016   Procedure: TRANSESOPHAGEAL ECHOCARDIOGRAM (TEE);  Surgeon: Sanda Klein, MD;  Location: [facility] ENDOSCOPY;  Service: Cardiovascular;  Laterality: N/A;     Home Medications:  Prior to Admission medications   Medication Sig Start Date End Date Taking? Authorizing Provider  acetaminophen (TYLENOL) 325 MG tablet Take 650 mg by mouth every 6 (six) hours as needed for moderate pain (for pain or headaches).   Yes [provider]  albuterol (VENTOLIN HFA) 108 (90 Base) MCG/ACT inhaler Inhale 2 puffs into the lungs every 6 (six) hours as needed for wheezing or  shortness of breath.   Yes [provider]  amitriptyline (ELAVIL) 50 MG tablet Take 50 mg by mouth at bedtime.   Yes [provider]  amLODipine (NORVASC) 10 MG tablet Take 10 mg by mouth daily.   Yes [provider]  aspirin EC 81 MG EC tablet Take 1 tablet (81 mg total) by mouth daily. 12/12/17  Yes [facility], Tami Lin, PA  carvedilol (COREG) 25 MG tablet Take 25 mg by mouth 2 (two) times daily with a meal.   Yes [provider]  cloNIDine (CATAPRES - DOSED IN MG/24 HR) 0.3 mg/24hr patch Place 1 patch (0.3 mg total) onto the skin once a week. 09/28/18  Yes Arrien, Jimmy Picket, MD  doxazosin (CARDURA) 8 MG tablet Take 2 tablets (16 mg total) by mouth at bedtime. Patient taking differently: Take 8 mg by mouth at bedtime. 12/07/19  Yes Skeet Latch, MD  empagliflozin (JARDIANCE) 10 MG TABS tablet Take 1 tablet (10 mg total) by mouth daily. 12/25/19  Yes Larey Dresser, MD  Evolocumab (REPATHA SURECLICK) 485 MG/ML  SOAJ Inject 1 Dose into the skin every 14 (fourteen) days. Patient taking differently: Inject 140 mg into the skin every 14 (fourteen) days. 11/29/19  Yes Hilty, Nadean Corwin, MD  hydrALAZINE (APRESOLINE) 25 MG tablet Take 1 tablet (25 mg total) by mouth 3 (three) times daily. Patient taking differently: Take 25 mg by mouth 2 (two) times daily. 04/26/20 04/26/21 Yes Larey Dresser, MD  HYDROcodone-acetaminophen (NORCO/VICODIN) 5-325 MG tablet Take 1-2 tablets by mouth every 6 (six) hours as needed for severe pain. 02/10/20  Yes Wieters, Hallie C, PA-C  minoxidil (LONITEN) 10 MG tablet TAKE 2 TABLETS(20 MG) BY MOUTH TWICE DAILY Patient taking differently: Take 10 mg by mouth 2 (two) times daily. 01/12/20  Yes Skeet Latch, MD  OVER THE COUNTER MEDICATION Place 1 drop into the left eye daily. [place]e Eye Drop   Yes [provider]  pantoprazole (PROTONIX) 40 MG tablet Take 1 tablet (40 mg total) by mouth daily. 11/28/19  Yes Larey Dresser, MD  STIOLTO RESPIMAT 2.5-2.5 MCG/ACT AERS INHALE 2 PUFFS INTO THE LUNGS DAILY 09/06/19  Yes Spero Geralds, MD  Blood Pressure Monitor KIT 1 Device by Does not apply route 2 (two) times daily. 11/29/18   Kerin Perna, NP    Inpatient Medications: Scheduled Meds: . amitriptyline  50 mg Oral QHS  . amLODipine  10 mg Oral Daily  . aspirin EC  81 mg Oral Daily  . [START ON 05/19/2020] atorvastatin  80 mg Oral Daily  . carvedilol  25 mg Oral BID WC  . cloNIDine  0.3 mg Transdermal Weekly  . doxazosin  16 mg Oral QHS  . hydrALAZINE  25 mg Oral BID  . lidocaine  1 patch Transdermal Q24H  . minoxidil  10 mg Oral BID  . nicotine  14 mg Transdermal Q24H  . nitroGLYCERIN      . sodium chloride flush  3 mL Intravenous Q12H   Continuous Infusions: . sodium chloride     PRN Meds: sodium chloride, acetaminophen **OR** acetaminophen, albuterol, HYDROcodone-acetaminophen, labetalol, nitroGLYCERIN, sodium chloride flush  Allergies:    Allergies   Allergen Reactions  . Imdur [Isosorbide Nitrate] Other (See Comments)    Headaches   . Tramadol Other (See Comments)    Headaches     Social History:   Social History   Socioeconomic History  . Marital status: Married    Spouse name: Not on file  .  Number of children: 5  . Years of education: 4  . Highest education level: Not on file  Occupational History  . Occupation: Disabled, part-time Programmer, systems    Comment: Disability  Tobacco Use  . Smoking status: Current Some Day Smoker    Packs/day: 0.25    Years: 23.00    Pack years: 5.75    Types: Cigarettes  . Smokeless tobacco: Never Used  . Tobacco comment: 3 to 4 per day  Vaping Use  . Vaping Use: Not on file  Substance and Sexual Activity  . Alcohol use: Yes    Alcohol/week: 1.0 standard drink    Types: 1 Cans of beer per week    Comment: occasionally  . Drug use: Yes    Types: Marijuana    Comment: 2 times/week  . Sexual activity: Yes  Other Topics Concern  . Not on file  Social History Narrative   Fun: Enjoy his children   Social Determinants of Radio broadcast assistant Strain: Not on file  Food Insecurity: Not on file  Transportation Needs: Not on file  Physical Activity: Not on file  Stress: Not on file  Social Connections: Not on file  Intimate Partner Violence: Not on file    Family History:    Family History  Problem Relation Age of Onset  . Hypertension Mother   . Hypertension Other   . Colon cancer Paternal Uncle   . Stroke Maternal Aunt   . Heart attack Brother   . Hypertension Brother   . Diabetes Maternal Aunt   . Lung cancer Maternal Uncle   . Stroke Maternal Uncle   . Other Sister        "breathing machine at night"  . Headache Sister   . Thyroid disease Sister   . Throat cancer Neg Hx   . Pancreatic cancer Neg Hx   . Esophageal cancer Neg Hx   . Kidney disease Neg Hx   . Liver disease Neg Hx      ROS:  Please see the history of present illness.  Constitutional:  Negative for chills, fever, night sweats, unintentional weight loss  HENT: Negative for ear pain and hearing loss.   Eyes: Negative for loss of vision and eye pain.  Respiratory: Negative for cough, sputum, wheezing.   Cardiovascular: See HPI. Gastrointestinal: Negative for abdominal pain, melena, and hematochezia.  Genitourinary: Negative for dysuria and hematuria.  Musculoskeletal: Negative for falls and myalgias. Positive for diffuse pain/tenderness across chest. Skin: Negative for itching and rash.  Neurological: Negative for focal weakness, focal sensory changes and loss of consciousness.  Endo/Heme/Allergies: Does not bruise/bleed easily.  All other ROS reviewed and negative.     Physical Exam/Data:   Vitals:   05/18/20 0435 05/18/20 0632 05/18/20 1043 05/18/20 1102  BP: (!) 154/97 138/63 (!) 161/84 (!) 155/90  Pulse: (!) 109 94 93   Resp: (!) 24 18    Temp:  (!) 97.4 F (36.3 C) 98.2 F (36.8 C)   TempSrc:  Oral Oral   SpO2:  96% 98%   Weight:      Height:        Intake/Output Summary (Last 24 hours) at 05/18/2020 1121 Last data filed at 05/18/2020 1044 Gross per 24 hour  Intake 440 ml  Output 800 ml  Net -360 ml   Last 3 Weights 05/17/2020 04/26/2020 02/16/2020  Weight (lbs) 164 lb 163 lb 163 lb  Weight (kg) 74.39 kg 73.936 kg 73.936 kg  Body mass index is 22.24 kg/m.  General:  Well nourished, well developed, in no acute distress. [place] in place. HEENT: normal Lymph: no adenopathy Neck: no JVD Endocrine:  No thryomegaly Vascular: No carotid bruits appreciated (but + transmitted murmur); RA pulses 2+ bilaterally Cardiac:  normal S1, S2; RRR; 5/6 systolic murmur loudest in RUSB, radiates throughout precordium Lungs:  clear to auscultation bilaterally, no wheezing, rhonchi or rales  Chest: tender to palpation across bilateral sternal region. Along left lower ribs, he is very tender, and there are palpable ribs that move with minor palpation Abd: soft, nontender, no  hepatomegaly  Ext: no edema Musculoskeletal:  No deformities, moves all 4 limbs independently. Skin: warm and dry  Neuro:  no focal abnormalities noted Psych:  Normal affect   EKG:  The EKG was personally reviewed and demonstrates:  Sinus tachycardia, LVH Telemetry:  Telemetry was personally reviewed and demonstrates:  Sinus rhythm/sinus tachycardia  Relevant CV Studies: Echo today, personally read: 1. Left ventricular ejection fraction, by estimation, is 60 to 65%. The  left ventricle has normal function. The left ventricle has no regional  wall motion abnormalities. There is severe left ventricular hypertrophy.  Left ventricular diastolic parameters  are consistent with Grade II diastolic dysfunction (pseudonormalization).  Elevated left ventricular end-diastolic pressure. The average left  ventricular global longitudinal strain is -14.2 %. The global longitudinal  strain is abnormal.  2. Right ventricular systolic function is normal. The right ventricular  size is normal. Tricuspid regurgitation signal is inadequate for assessing  PA pressure.  3. Left atrial size was severely dilated.  4. Right atrial size was moderately dilated.  5. A small pericardial effusion is present. There is no evidence of  cardiac tamponade.  6. The mitral valve is normal in structure. Trivial mitral valve  regurgitation. No evidence of mitral stenosis.  7. The aortic valve has been repaired/replaced. Aortic valve  regurgitation is not visualized. Moderate aortic valve stenosis. There is  a bioprosthetic valve present in the aortic position. Procedure Date:  2016. Aortic valve mean gradient measures 31.7  mmHg.  8. Aortic root/ascending aorta has been repaired/replaced.  9. The inferior vena cava is normal in size with greater than 50%  respiratory variability, suggesting right atrial pressure of 3 mmHg.   Echo 11/25/19 1. Left ventricular ejection fraction, by estimation, is 60 to 65%.  The  left ventricle has normal function. The left ventricle has no regional  wall motion abnormalities. There is severe concentric left ventricular  hypertrophy. Left ventricular diastolic  parameters are consistent with Grade I diastolic dysfunction (impaired  relaxation). Elevated left ventricular end-diastolic pressure.  2. Right ventricular systolic function is normal. The right ventricular  size is normal. Tricuspid regurgitation signal is inadequate for assessing  PA pressure.  3. Left atrial size was mildly dilated.  4. The mitral valve is normal in structure. No evidence of mitral valve  regurgitation. No evidence of mitral stenosis.  5. The aortic valve is normal in structure. Aortic valve regurgitation is  not visualized. No aortic stenosis is present. There is a 27 mm Sorin  Mitroflow followed by endovascular repair of a descending thoracic  aneurysm valve present in the aortic  position. Procedure Date: 10/03/2014. Echo findings are consistent with  normal structure and function of the aortic valve prosthesis. Aortic valve  area, by VTI measures 2.01 cm. Aortic valve mean gradient measures 18.0  mmHg. Aortic valve Vmax measures  2.82 m/s.  6. Aortic dilatation noted. There is mild  dilatation of the ascending  aorta, measuring 40 mm.  7. The inferior vena cava is normal in size with greater than 50%  respiratory variability, suggesting right atrial pressure of 3 mmHg.   Cath 09/26/2018 FINDINGS: 1. Large, tortuous coronary arteries, mild to moderate disease involving the main branches. Appearance is similar to prior catheterizations in 11/2017. 2. Patent ostial ramus intermedius stent.  RECOMMENDATIONS: 1. Medical therapy and secondary prevention. 2. Restart clopidogrel 75 mg daily in addition to aspirin 81 mg daily. 3. Post-cath hydration with repeat BMP tomorrow to monitor renal function following contrast exposure today.   Laboratory Data:  High  Sensitivity Troponin:   Recent Labs  Lab 05/17/20 1546 05/17/20 1716 05/17/20 2141 05/18/20 0525 05/18/20 0716  TROPONINIHS 136* 201* 193* 159* 150*     Chemistry Recent Labs  Lab 05/15/20 1441 05/17/20 1546 05/18/20 0150  NA 140 139 139  K 4.0 3.7 3.8  CL 111 109 110  CO2 20* 21* 23  GLUCOSE 97 97 93  BUN 30* 33* 32*  CREATININE 3.90* 3.92* 4.01*  CALCIUM 8.9 9.0 9.0  GFRNONAA 18* 18* 18*  ANIONGAP 9 9 6     Recent Labs  Lab 05/17/20 1601 05/18/20 0150  PROT 7.2 6.7  ALBUMIN 3.9 3.7  AST 17 16  ALT 11 11  ALKPHOS 70 62  BILITOT 0.8 0.9   Hematology Recent Labs  Lab 05/15/20 1441 05/17/20 1546 05/18/20 0150  WBC 5.4 5.4 6.2  RBC 3.96* 3.83* 3.63*  HGB 12.7* 12.0* 11.5*  HCT 37.8* 36.1* 35.0*  MCV 95.5 94.3 96.4  MCH 32.1 31.3 31.7  MCHC 33.6 33.2 32.9  RDW 13.6 13.5 13.8  PLT 106* 102* 96*   BNP Recent Labs  Lab 05/17/20 1546  BNP 323.2*    DDimer No results for input(s): DDIMER in the last 168 hours.   Radiology/Studies:  DG Chest 2 View  Result Date: 05/17/2020 CLINICAL DATA:  Chest pain. Additional history provided: Patient reports chest pain which began several days ago, history of 2 previous myocardial infarctions. Patient reports 10/10 pain. EXAM: CHEST - 2 VIEW COMPARISON:  Prior chest radiographs 05/15/2020. FINDINGS: Prior median sternotomy with redemonstrated aortic stent graft. Prior aortic valve replacement. A loop recorder device projects over the left chest. Unchanged cardiomegaly. No appreciable airspace consolidation or pulmonary edema. No evidence of pleural effusion or pneumothorax. No acute bony abnormality identified. Surgical clips project in the region of the right axilla. IMPRESSION: No evidence of acute cardiopulmonary abnormality. Redemonstrated postsurgical changes, as described. Unchanged cardiomegaly. Electronically Signed   By: Kellie Simmering DO   On: 05/17/2020 16:12   DG Chest 2 View  Result Date: 05/15/2020 CLINICAL  DATA:  Chest pain EXAM: CHEST - 2 VIEW COMPARISON:  11/24/2019, CT 11/24/2019, 09/24/2018 FINDINGS: Post sternotomy changes. Similar appearance of aortic stent graft and valve prostheses. Recording device over the left chest. No focal opacity or pleural effusion. Mild atelectasis or scar at the left base. Stable cardiomediastinal silhouette. No pneumothorax. IMPRESSION: No active cardiopulmonary disease. Similar postsurgical changes of the mediastinum. Electronically Signed   By: Donavan Foil M.D.   On: 05/15/2020 15:31   ECHOCARDIOGRAM COMPLETE  Result Date: 05/18/2020    ECHOCARDIOGRAM REPORT   Patient Name:   [name] Date of Exam: 05/18/2020 Medical Rec #:  [id number]     Height:       72.0 in Accession #:    [id number]    Weight:       164.0 lb  Date of Birth:  [birth year]      BSA:          1.958 m Patient Age:    69 years      BP:           138/63 mmHg Patient Gender: M             HR:           90 bpm. Exam Location:  Inpatient Procedure: 2D Echo, Cardiac Doppler, Color Doppler and Strain Analysis Indications:    R07.9* Chest pain, unspecified  History:        Patient has prior history of Echocardiogram examinations, most                 recent 11/25/2019. CHF and Cardiomyopathy, CAD, Stroke, Aortic                 Valve Disease; Risk Factors:Hypertension, Dyslipidemia and                 Current Smoker. Descending Thoracic Aorta Dissection. ETOH                 abuse. GERD. CKD.                 Aortic Valve: bioprosthetic valve is present in the aortic                 position. Procedure Date: 2016.  Sonographer:    Tiffany Dance Referring Phys: [id number] [place]  1. Left ventricular ejection fraction, by estimation, is 60 to 65%. The left ventricle has normal function. The left ventricle has no regional wall motion abnormalities. There is severe left ventricular hypertrophy. Left ventricular diastolic parameters  are consistent with Grade II diastolic dysfunction  (pseudonormalization). Elevated left ventricular end-diastolic pressure. The average left ventricular global longitudinal strain is -14.2 %. The global longitudinal strain is abnormal.  2. Right ventricular systolic function is normal. The right ventricular size is normal. Tricuspid regurgitation signal is inadequate for assessing PA pressure.  3. Left atrial size was severely dilated.  4. Right atrial size was moderately dilated.  5. A small pericardial effusion is present. There is no evidence of cardiac tamponade.  6. The mitral valve is normal in structure. Trivial mitral valve regurgitation. No evidence of mitral stenosis.  7. The aortic valve has been repaired/replaced. Aortic valve regurgitation is not visualized. Moderate aortic valve stenosis. There is a bioprosthetic valve present in the aortic position. Procedure Date: 2016. Aortic valve mean gradient measures 31.7 mmHg.  8. Aortic root/ascending aorta has been repaired/replaced.  9. The inferior vena cava is normal in size with greater than 50% respiratory variability, suggesting right atrial pressure of 3 mmHg. Comparison(s): Changes from prior study are noted. Conclusion(s)/Recommendation(s): Otherwise normal echocardiogram, with minor abnormalities described in the report. EF remains normal, though strain is abnormal. Bioprosthetic aortic valve gradient has increased since last study (prior mean gradient 18 mmHg). Severe LVH. FINDINGS  Left Ventricle: Left ventricular ejection fraction, by estimation, is 60 to 65%. The left ventricle has normal function. The left ventricle has no regional wall motion abnormalities. The average left ventricular global longitudinal strain is -14.2 %. The global longitudinal strain is abnormal. The left ventricular internal cavity size was normal in size. There is severe left ventricular hypertrophy. Left ventricular diastolic parameters are consistent with Grade II diastolic dysfunction (pseudonormalization). Elevated  left ventricular end-diastolic pressure. Right Ventricle: The right ventricular size is normal. No increase in  right ventricular wall thickness. Right ventricular systolic function is normal. Tricuspid regurgitation signal is inadequate for assessing PA pressure. Left Atrium: Left atrial size was severely dilated. Right Atrium: Right atrial size was moderately dilated. Pericardium: A small pericardial effusion is present. There is no evidence of cardiac tamponade. Mitral Valve: The mitral valve is normal in structure. There is mild thickening of the mitral valve leaflet(s). Trivial mitral valve regurgitation. No evidence of mitral valve stenosis. Tricuspid Valve: The tricuspid valve is normal in structure. Tricuspid valve regurgitation is trivial. No evidence of tricuspid stenosis. Aortic Valve: The aortic valve has been repaired/replaced. Aortic valve regurgitation is not visualized. Moderate aortic stenosis is present. Aortic valve mean gradient measures 31.7 mmHg. Aortic valve peak gradient measures 55.8 mmHg. Aortic valve area,  by VTI measures 0.99 cm. There is a bioprosthetic valve present in the aortic position. Procedure Date: 2016. Pulmonic Valve: The pulmonic valve was grossly normal. Pulmonic valve regurgitation is mild to moderate. No evidence of pulmonic stenosis. Aorta: The aortic root/ascending aorta has been repaired/replaced. Venous: The inferior vena cava is normal in size with greater than 50% respiratory variability, suggesting right atrial pressure of 3 mmHg. IAS/Shunts: The atrial septum is grossly normal.  LEFT VENTRICLE PLAX 2D LVIDd:         4.15 cm  Diastology LVIDs:         2.58 cm  LV e' lateral:   6.31 cm/s LV PW:         2.44 cm  LV E/e' lateral: 17.1 LV IVS:        2.39 cm LVOT diam:     2.00 cm  2D Longitudinal Strain LV SV:         72       2D Strain GLS (A2C):   -14.7 % LV SV Index:   37       2D Strain GLS (A3C):   -13.8 % LVOT Area:     3.14 cm 2D Strain GLS (A4C):   -14.2 %                          2D Strain GLS Avg:     -14.2 % RIGHT VENTRICLE             IVC RV Basal diam:  3.20 cm     IVC diam: 1.50 cm RV Mid diam:    1.50 cm RV S prime:     10.70 cm/s TAPSE (M-mode): 2.2 cm LEFT ATRIUM              Index       RIGHT ATRIUM           Index LA diam:        4.30 cm  2.20 cm/m  RA Area:     21.40 cm LA Vol (A2C):   97.7 ml  49.89 ml/m RA Volume:   60.90 ml  31.10 ml/m LA Vol (A4C):   111.0 ml 56.68 ml/m LA Biplane Vol: 112.0 ml 57.19 ml/m  AORTIC VALVE AV Area (Vmax):    1.03 cm AV Area (Vmean):   0.98 cm AV Area (VTI):     0.99 cm AV Vmax:           373.33 cm/s AV Vmean:          265.000 cm/s AV VTI:            0.723 m AV Peak Grad:  55.8 mmHg AV Mean Grad:      31.7 mmHg LVOT Vmax:         122.00 cm/s LVOT Vmean:        82.300 cm/s LVOT VTI:          0.228 m LVOT/AV VTI ratio: 0.32  AORTA Ao Root diam: 3.90 cm Ao Asc diam:  2.80 cm MITRAL VALVE MV Area (PHT): 2.99 cm     SHUNTS MV Decel Time: 254 msec     Systemic VTI:  0.23 m MV E velocity: 108.00 cm/s  Systemic Diam: 2.00 cm MV A velocity: 125.00 cm/s MV E/A ratio:  0.86 Buford Dresser MD Electronically signed by Buford Dresser MD Signature Date/Time: 05/18/2020/10:53:54 AM    Final      Assessment and Plan:   Chest pain Elevated troponins History of CAD with prior NSTEMI and PCI -His troponins are chronically elevated into the mid-double digits, likely secondary to his chronic CKD. His hsTnI are mildly elevated above his baseline today, and they are downtrending. Importantly, they were elevated >2300 in 09/2018, at which time he had cath showing stable disease. Therefore, the utility of cath is low, and the risk of renal injury is elevated given his baseline CKD stage 4. Cr today is 4.01. He and I discussed this, and we are both in agreement re: no cath at this time. Given that we would not cath, utility of inpatient stress test is low. -last echo with EF 60-65%, normal functioning AVR.  -echo today  with preserved EF, severe LVH, increased mean gradient of bioprosthetic AVR. -continue aspirin 81 mg daily -declines imdur as nitrates give him a terrible headache -no ischemia on myoview 11/2019. -will stop heparin -he has diffuse tenderness/pain on exam of chest, and there are palpable ribs that are displaced/pop when palpated. Will trial topical lidoderm patch to see if this helps. -his elevated troponins and pain are not consistent with NSTEMI, suspect that this is due to stress from pain and worsening CKD.  History of aortic dissection, with total arch replacement and staged endovascular repair of descending aorta S/P [place] and bioprosthetic AVR -loud murmur noted previously. 5/6 systolic murmur today. -echo today shows preserved EF. Gradient on bioprosthetic aortic valve now moderate, has progressed since prior echo  Hypertension Chronic kidney disease, stage 4 -Cr currently 4.01, GFR 18 -continue amlodipine 10 mg daily, carvedilol 25 mg BID, clonidine patch 0.3 mg, hydralazine 25 mg (he takes BID), doxazosin 16 mg daily, minoxidil 10 mg BID -with GFR now <30, will hold Jardiance -no room for ACEi/ARB/MRA/ARNI given CKD -appears euvolemic  Hyperlipidemia -on repatha as outpatient -appears at his last visit with Dr. Debara Pickett he was also on rosuvastatin 40 mg daily. This is not on his current medication list. Appears it was on his discharge medication list 11/26/19 but did not continue to his outpatient medications after that. He does not recall being on it for at least several months. -Per pharmacy, max dose of rosuvastatin with his current GFR is 10 mg/day. I will continue atorvastatin. He does not recall being on this previously.  [facility] will sign off.   Medication Recommendations:  Would return to outpatient cardiac medications, with addition of atorvastatin 80 mg. Would consider discharging with lidoderm patch if this helps his pain Other recommendations (labs, testing,  etc):  none Follow up as an outpatient:  He has pending outpatient follow up with Dr. Caryl Comes on 05/20/20 and with the pharmacy clinic on the same day. He has  heart failure clinic follow up scheduled for 06/10/20.   Risk Assessment/Risk Scores:     TIMI Risk Score for Unstable Angina or Non-ST Elevation MI:   The patient's TIMI risk score is 5, which indicates a 26% risk of all cause mortality, new or recurrent myocardial infarction or need for urgent revascularization in the next 14 days.    For questions or updates, please contact [place] Please consult [email or web address] for contact info under    Signed, Buford Dresser, MD  05/18/2020 11:21 AM

## 2020-05-19 DIAGNOSIS — I25119 Atherosclerotic heart disease of native coronary artery with unspecified angina pectoris: Secondary | ICD-10-CM | POA: Diagnosis not present

## 2020-05-19 MED ORDER — LIDOCAINE 5 % EX PTCH: 1.0000 | MEDICATED_PATCH | Freq: Two times a day (BID) | CUTANEOUS | 0 refills | Status: DC

## 2020-05-19 MED ORDER — ATORVASTATIN CALCIUM 80 MG PO TABS: 80.0000 mg | ORAL_TABLET | Freq: Every day | ORAL | 2 refills | Status: DC

## 2020-05-19 MED ORDER — NICOTINE 14 MG/24HR TD PT24: 14.0000 mg | MEDICATED_PATCH | TRANSDERMAL | 0 refills | Status: DC

## 2020-05-19 NOTE — Discharge Summary (Signed)
Physician Discharge Summary  [name] CHE:[id number] DOB: [birth year] DOA: 05/17/2020  PCP: Kerin Perna, NP  Admit date: 05/17/2020 Discharge date: 05/19/2020  Admitted From: Home Disposition: Home  Recommendations for Outpatient Follow-up:  1. Follow up with PCP in 1-2 weeks 2. Please obtain BMP/CBC in one week 3. Follow up with cardiologist  Home Health: None Equipment/Devices: None Discharge Condition: Stable CODE STATUS: Full code Diet recommendation: Cardiac diet Brief/Interim Summary:Andre J [name] a 50 y.o.malewith medical history significantfor CAD, NSTEMI 11/2017 with LHC with stent placed in proximal ramus. Repeat cath in 7/20 with nonobstructive disease, AAA repair in 2016 at [facility], open repair of TAAA secondary to chronic dissection with multibranch dacron graft 12/2016, CVA 2017, bioprothetic AVR 2016, HLD, CKD stage IV, malignant HTN, tobacco abuse and HTN cardiomyopathy. Last echo 9/21. Also history GERD not relieved with PPI therapy.  He has history of chronic chest pain for the past 5 years, butreports he has had worsening chest pain over the last week. Pain starts in his abdomen and goes up to his chest and radiates down his left arm. Pain is constant in nature and described as pressure. Pain rated as a 10/10 and is sharp in nature. He states he has chronic chest pain but this pain was much more intense so he came to hospital. He states he will have occssional shortness of breath and light headedness and at times nausea. If he takes tylenol and lays back he feels better and itgets worse with exertion(new for him). He feels like his heart is racing. He denies any drug use, but does smoke. He is an occasional drinker and smokes MJ.He has chronic hx of headaches which is at his baseline. No vision changes, vomiting, coughing, diarrhea, leg swelling.  ED Course:ON arrival vitals stable with bp of 148/94, HR 96, afebrile, RR of 18 and oxygen of 100% on room air. eKG  done which showed no significant changes from previous ekg, no ST elevation and sinus tachycardia. Significant labs showed a troponin of 136 (baseline around 60-80) and worsening creatinine to 3.92 with slow trend upwards over last few months. CXR stable. Unchanged cardiomegaly. ER consulted cardiology with elevated tropoinins (dr. Marisue Ivan). Recommends admit here, trend troponins and heparin/imdur. He was given dose of labetalol for BP control and pain meds.   Discharge Diagnoses:  Principal Problem:   Coronary artery disease involving native coronary artery of native heart with angina pectoris ([place]) Active Problems:   Hyperlipidemia LDL goal <70   Tobacco use   Marijuana use   Chronic bronchitis (HCC)   Hypertensive cardiomyopathy, without heart failure ([place]): EF reduced to 35-40% from 55%.   Malignant hypertension   CKD (chronic kidney disease), stage IV (HCC)   H/O aortic valve replacement   History of aortic dissection     #1  Chest pain/elevated troponin/CAD/PCI-patient admitted with 10 out of 10 chest pain radiating to his left upper extremity.His troponin peaked at 201 in the setting of CKD.  Was initially started on heparin drip due to his significant complex cardiac history.  His EKG did not reveal any acute changes.  His pain was thought to be secondary to muscular/chest wall/rib pain.  Patient has had his ribs occurred 5 years ago for surgery for aortic dissection.  Since then he has been having chronic pain and tenderness to the chest area. Echocardiogram done during this admission shows-ejection fraction 60 to 65%.  Severe LVH no regional wall motion abnormalities.  Grade 2 diastolic dysfunction.  Small  pericardial effusion no evidence of cardiac tamponade.   #2 CKD stage IV-follow-up with outpatient nephrologist.  #3 history of bioprosthetic AVR/aortic dissection with total arch replacement and endovascular repair of descending aorta  #4 history of of malignant  hypertension-with mild right renal artery stenosis.  It does not appear that he is compliant to his medications at home.  Continue all home meds.  #5 hyperlipidemia LDL is 55 on Repatha and statin triglycerides 37.  #6 polysubstance abuse urine tox screen positive for marijuana and opiates.  He reports he takes marijuana for chronic chest pain.  #7 GERD he is supposed to follow-up with GI   #8 tobacco abuse continue nicotine patch discussed importance of quitting.  #9 history of stroke continue statin  #10 thrombocytopenia platelets were 96 on discharge.  Estimated body mass index is 22.24 kg/m as calculated from the following:   Height as of this encounter: 6' (1.829 m).   Weight as of this encounter: 74.4 kg.  Discharge Instructions   Allergies as of 05/19/2020      Reactions   Imdur [isosorbide Nitrate] Other (See Comments)   Headaches   Tramadol Other (See Comments)   Headaches      Medication List    STOP taking these medications   OVER THE COUNTER MEDICATION     TAKE these medications   acetaminophen 325 MG tablet Commonly known as: TYLENOL Take 650 mg by mouth every 6 (six) hours as needed for moderate pain (for pain or headaches).   albuterol 108 (90 Base) MCG/ACT inhaler Commonly known as: VENTOLIN HFA Inhale 2 puffs into the lungs every 6 (six) hours as needed for wheezing or shortness of breath.   amitriptyline 50 MG tablet Commonly known as: ELAVIL Take 50 mg by mouth at bedtime.   amLODipine 10 MG tablet Commonly known as: NORVASC Take 10 mg by mouth daily.   aspirin 81 MG EC tablet Take 1 tablet (81 mg total) by mouth daily.   atorvastatin 80 MG tablet Commonly known as: LIPITOR Take 1 tablet (80 mg total) by mouth daily.   Blood Pressure Monitor Kit 1 Device by Does not apply route 2 (two) times daily.   carvedilol 25 MG tablet Commonly known as: COREG Take 25 mg by mouth 2 (two) times daily with a meal.   cloNIDine 0.3 mg/24hr  patch Commonly known as: CATAPRES - Dosed in mg/24 hr Place 1 patch (0.3 mg total) onto the skin once a week.   doxazosin 8 MG tablet Commonly known as: CARDURA Take 2 tablets (16 mg total) by mouth at bedtime. What changed: how much to take   empagliflozin 10 MG Tabs tablet Commonly known as: JARDIANCE Take 1 tablet (10 mg total) by mouth daily.   hydrALAZINE 25 MG tablet Commonly known as: APRESOLINE Take 1 tablet (25 mg total) by mouth 3 (three) times daily. What changed: when to take this   HYDROcodone-acetaminophen 5-325 MG tablet Commonly known as: NORCO/VICODIN Take 1-2 tablets by mouth every 6 (six) hours as needed for severe pain.   minoxidil 10 MG tablet Commonly known as: LONITEN TAKE 2 TABLETS(20 MG) BY MOUTH TWICE DAILY What changed: See the new instructions.   nicotine 14 mg/24hr patch Commonly known as: NICODERM CQ - dosed in mg/24 hours Place 1 patch (14 mg total) onto the skin daily.   pantoprazole 40 MG tablet Commonly known as: PROTONIX Take 1 tablet (40 mg total) by mouth daily.   Repatha SureClick 921 MG/ML So[place] Generic drug:  Evolocumab Inject 1 Dose into the skin every 14 (fourteen) days. What changed: how much to take   Stiolto Respimat 2.5-2.5 MCG/ACT Aers Generic drug: Tiotropium Bromide-Olodaterol INHALE 2 PUFFS INTO THE LUNGS DAILY       Follow-up Information    Kerin Perna, NP Follow up.   Specialty: Internal Medicine Contact information: [place] [address] [phone]        Larey Dresser, MD .   Specialty: Cardiology Contact information: [phone] [address] [place] [address] [phone]              Allergies  Allergen Reactions   Imdur [Isosorbide Nitrate] Other (See Comments)    Headaches    Tramadol Other (See Comments)    Headaches     Consultations: Cardiology  Procedures/Studies: DG Chest 2 View  Result Date: 05/17/2020 CLINICAL DATA:  Chest  pain. Additional history provided: Patient reports chest pain which began several days ago, history of 2 previous myocardial infarctions. Patient reports 10/10 pain. EXAM: CHEST - 2 VIEW COMPARISON:  Prior chest radiographs 05/15/2020. FINDINGS: Prior median sternotomy with redemonstrated aortic stent graft. Prior aortic valve replacement. A loop recorder device projects over the left chest. Unchanged cardiomegaly. No appreciable airspace consolidation or pulmonary edema. No evidence of pleural effusion or pneumothorax. No acute bony abnormality identified. Surgical clips project in the region of the right axilla. IMPRESSION: No evidence of acute cardiopulmonary abnormality. Redemonstrated postsurgical changes, as described. Unchanged cardiomegaly. Electronically Signed   By: Kellie Simmering DO   On: 05/17/2020 16:12   DG Chest 2 View  Result Date: 05/15/2020 CLINICAL DATA:  Chest pain EXAM: CHEST - 2 VIEW COMPARISON:  11/24/2019, CT 11/24/2019, 09/24/2018 FINDINGS: Post sternotomy changes. Similar appearance of aortic stent graft and valve prostheses. Recording device over the left chest. No focal opacity or pleural effusion. Mild atelectasis or scar at the left base. Stable cardiomediastinal silhouette. No pneumothorax. IMPRESSION: No active cardiopulmonary disease. Similar postsurgical changes of the mediastinum. Electronically Signed   By: Donavan Foil M.D.   On: 05/15/2020 15:31   ECHOCARDIOGRAM COMPLETE  Result Date: 05/18/2020    ECHOCARDIOGRAM REPORT   Patient Name:   AGAMJOT KILGALLON Date of Exam: 05/18/2020 Medical Rec #:  [id number]     Height:       72.0 in Accession #:    [id number]    Weight:       164.0 lb Date of Birth:  January 24, 1971      BSA:          1.958 m Patient Age:    50 years      BP:           138/63 mmHg Patient Gender: M             HR:           90 bpm. Exam Location:  Inpatient Procedure: 2D Echo, Cardiac Doppler, Color Doppler and Strain Analysis Indications:    R07.9* Chest pain,  unspecified  History:        Patient has prior history of Echocardiogram examinations, most                 recent 11/25/2019. CHF and Cardiomyopathy, CAD, Stroke, Aortic                 Valve Disease; Risk Factors:Hypertension, Dyslipidemia and                 Current Smoker.  Descending Thoracic Aorta Dissection. ETOH                 abuse. GERD. CKD.                 Aortic Valve: bioprosthetic valve is present in the aortic                 position. Procedure Date: 2016.  Sonographer:    Tiffany Dance Referring Phys: [id number] [place]  1. Left ventricular ejection fraction, by estimation, is 60 to 65%. The left ventricle has normal function. The left ventricle has no regional wall motion abnormalities. There is severe left ventricular hypertrophy. Left ventricular diastolic parameters  are consistent with Grade II diastolic dysfunction (pseudonormalization). Elevated left ventricular end-diastolic pressure. The average left ventricular global longitudinal strain is -14.2 %. The global longitudinal strain is abnormal.  2. Right ventricular systolic function is normal. The right ventricular size is normal. Tricuspid regurgitation signal is inadequate for assessing PA pressure.  3. Left atrial size was severely dilated.  4. Right atrial size was moderately dilated.  5. A small pericardial effusion is present. There is no evidence of cardiac tamponade.  6. The mitral valve is normal in structure. Trivial mitral valve regurgitation. No evidence of mitral stenosis.  7. The aortic valve has been repaired/replaced. Aortic valve regurgitation is not visualized. Moderate aortic valve stenosis. There is a bioprosthetic valve present in the aortic position. Procedure Date: 2016. Aortic valve mean gradient measures 31.7 mmHg.  8. Aortic root/ascending aorta has been repaired/replaced.  9. The inferior vena cava is normal in size with greater than 50% respiratory variability, suggesting right atrial  pressure of 3 mmHg. Comparison(s): Changes from prior study are noted. Conclusion(s)/Recommendation(s): Otherwise normal echocardiogram, with minor abnormalities described in the report. EF remains normal, though strain is abnormal. Bioprosthetic aortic valve gradient has increased since last study (prior mean gradient 18 mmHg). Severe LVH. FINDINGS  Left Ventricle: Left ventricular ejection fraction, by estimation, is 60 to 65%. The left ventricle has normal function. The left ventricle has no regional wall motion abnormalities. The average left ventricular global longitudinal strain is -14.2 %. The global longitudinal strain is abnormal. The left ventricular internal cavity size was normal in size. There is severe left ventricular hypertrophy. Left ventricular diastolic parameters are consistent with Grade II diastolic dysfunction (pseudonormalization). Elevated left ventricular end-diastolic pressure. Right Ventricle: The right ventricular size is normal. No increase in right ventricular wall thickness. Right ventricular systolic function is normal. Tricuspid regurgitation signal is inadequate for assessing PA pressure. Left Atrium: Left atrial size was severely dilated. Right Atrium: Right atrial size was moderately dilated. Pericardium: A small pericardial effusion is present. There is no evidence of cardiac tamponade. Mitral Valve: The mitral valve is normal in structure. There is mild thickening of the mitral valve leaflet(s). Trivial mitral valve regurgitation. No evidence of mitral valve stenosis. Tricuspid Valve: The tricuspid valve is normal in structure. Tricuspid valve regurgitation is trivial. No evidence of tricuspid stenosis. Aortic Valve: The aortic valve has been repaired/replaced. Aortic valve regurgitation is not visualized. Moderate aortic stenosis is present. Aortic valve mean gradient measures 31.7 mmHg. Aortic valve peak gradient measures 55.8 mmHg. Aortic valve area,  by VTI measures 0.99  cm. There is a bioprosthetic valve present in the aortic position. Procedure Date: 2016. Pulmonic Valve: The pulmonic valve was grossly normal. Pulmonic valve regurgitation is mild to moderate. No evidence of pulmonic stenosis. Aorta: The aortic root/ascending aorta has been  repaired/replaced. Venous: The inferior vena cava is normal in size with greater than 50% respiratory variability, suggesting right atrial pressure of 3 mmHg. IAS/Shunts: The atrial septum is grossly normal.  LEFT VENTRICLE PLAX 2D LVIDd:         4.15 cm  Diastology LVIDs:         2.58 cm  LV e' lateral:   6.31 cm/s LV PW:         2.44 cm  LV E/e' lateral: 17.1 LV IVS:        2.39 cm LVOT diam:     2.00 cm  2D Longitudinal Strain LV SV:         72       2D Strain GLS (A2C):   -14.7 % LV SV Index:   37       2D Strain GLS (A3C):   -13.8 % LVOT Area:     3.14 cm 2D Strain GLS (A4C):   -14.2 %                         2D Strain GLS Avg:     -14.2 % RIGHT VENTRICLE             IVC RV Basal diam:  3.20 cm     IVC diam: 1.50 cm RV Mid diam:    1.50 cm RV S prime:     10.70 cm/s TAPSE (M-mode): 2.2 cm LEFT ATRIUM              Index       RIGHT ATRIUM           Index LA diam:        4.30 cm  2.20 cm/m  RA Area:     21.40 cm LA Vol (A2C):   97.7 ml  49.89 ml/m RA Volume:   60.90 ml  31.10 ml/m LA Vol (A4C):   111.0 ml 56.68 ml/m LA Biplane Vol: 112.0 ml 57.19 ml/m  AORTIC VALVE AV Area (Vmax):    1.03 cm AV Area (Vmean):   0.98 cm AV Area (VTI):     0.99 cm AV Vmax:           373.33 cm/s AV Vmean:          265.000 cm/s AV VTI:            0.723 m AV Peak Grad:      55.8 mmHg AV Mean Grad:      31.7 mmHg LVOT Vmax:         122.00 cm/s LVOT Vmean:        82.300 cm/s LVOT VTI:          0.228 m LVOT/AV VTI ratio: 0.32  AORTA Ao Root diam: 3.90 cm Ao Asc diam:  2.80 cm MITRAL VALVE MV Area (PHT): 2.99 cm     SHUNTS MV Decel Time: 254 msec     Systemic VTI:  0.23 m MV E velocity: 108.00 cm/s  Systemic Diam: 2.00 cm MV A velocity: 125.00 cm/s MV E/A  ratio:  0.86 Buford Dresser MD Electronically signed by Buford Dresser MD Signature Date/Time: 05/18/2020/10:53:54 AM    Final    VAS US RENAL ARTERY DUPLEX  Result Date: 05/15/2020 ABDOMINAL VISCERAL Indications: Patient has had increasing chest pain over the last month. History              of hypertension with renal failure. High Risk Factors: Hypertension, hyperlipidemia,  current smoker, prior MI,                    coronary artery disease, prior CVA. Other Factors: History of CKD stage IV                 Brachial pressure at end of exam 175/90 mmHg.                 Most recent blood work                04/26/2020                BUN 31                Creatinine 3.46. Vascular Interventions: History of AAA repair 2016 at [facility] , open repair of TAAA                         secondary to chronic dissection with multi branch dacron                         graft 12/2016,bioprosthetic AVR 2016. Limitations: Air/bowel gas and Patient unable to hold breath. Comparison Study: Prior renal artery duplex exam on 03/28/2014 showed no evidence                   of renal artery stenosis. Right ostial arterial velocity 116                   cm/s and left mid renal artery velocity 67 cm/s. Performing Technologist: Salvadore Dom RVT, RDCS (AE), RDMS  Examination Guidelines: A complete evaluation includes B-mode imaging, spectral Doppler, color Doppler, and power Doppler as needed of all accessible portions of each vessel. Bilateral testing is considered an integral part of a complete examination. Limited examinations for reoccurring indications may be performed as noted.  Duplex Findings: +--------------------+--------+--------+------+-------------+  Mesenteric           PSV cm/s EDV cm/s Plaque   Comments     +--------------------+--------+--------+------+-------------+  Aorta Prox             105                                   +--------------------+--------+--------+------+-------------+  Aorta Distal             46                                   +--------------------+--------+--------+------+-------------+  Celiac Artery Origin   184                    stent present  +--------------------+--------+--------+------+-------------+  SMA Origin             145       10           stent present  +--------------------+--------+--------+------+-------------+ +-------------+--------+--------+  Celiac Stent  PSV cm/s EDV cm/s  +-------------+--------+--------+  Prox to stent   184              +-------------+--------+--------+  +---------+--------+--------+  SMA Stent PSV cm/s EDV cm/s  +---------+--------+--------+  Origin      145       10     +---------+--------+--------+ +-------------+--------+--------+  RRA Stent  PSV cm/s EDV cm/s  +-------------+--------+--------+  Prox to stent   105              +-------------+--------+--------+  Origin          234       29     +-------------+--------+--------+  Mid             104       19     +-------------+--------+--------+  Distal           83       17     +-------------+--------+--------+   Technologist observations/stent(s): Fluid collection anterior to proximal/mid aorta measuring 5.3 x 2.9 x 3.5 cm, question post surgical  +------------------+--------+--------+-------+  Right Renal Artery PSV cm/s EDV cm/s Comment  +------------------+--------+--------+-------+  Mid                  136       24             +------------------+--------+--------+-------+  Distal                59       7              +------------------+--------+--------+-------+ +-----------------+--------+--------+------------------------------------+  Left Renal Artery PSV cm/s EDV cm/s               Comment                 +-----------------+--------+--------+------------------------------------+  Origin                              not seen due to respiratory movement  +-----------------+--------+--------+------------------------------------+  Proximal             23       0                                            +-----------------+--------+--------+------------------------------------+  Mid                  42       2                                           +-----------------+--------+--------+------------------------------------+  Distal               46       3                                           +-----------------+--------+--------+------------------------------------+ +------------+--------+--------+----+-----------+--------+--------+----+  Right Kidney PSV cm/s EDV cm/s RI   Left Kidney PSV cm/s EDV cm/s RI    +------------+--------+--------+----+-----------+--------+--------+----+  Upper Pole   12       0        0.99 Upper Pole  17       0        0.98  +------------+--------+--------+----+-----------+--------+--------+----+  Mid          15       0        0.99 Mid         12  0        0.99  +------------+--------+--------+----+-----------+--------+--------+----+  Lower Pole   13       0        0.98 Lower Pole  13       0        0.98  +------------+--------+--------+----+-----------+--------+--------+----+  Hilar        30       9        0.70 Hilar       33       6        0.82  +------------+--------+--------+----+-----------+--------+--------+----+ +------------------+-------+------------------+--------+  Right Kidney               Left Kidney                  +------------------+-------+------------------+--------+  RAR                        RAR                          +------------------+-------+------------------+--------+  RAR (manual)       2.2     RAR (manual)       .4        +------------------+-------+------------------+--------+  Cortex                     Cortex                       +------------------+-------+------------------+--------+  Cortex thickness   9.00 mm Corex thickness    14.00 mm  +------------------+-------+------------------+--------+  Kidney length (cm) 9.70    Kidney length (cm) 9.80      +------------------+-------+------------------+--------+  Findings reported  to DOD Dr. Gardiner Rhyme at 9:45 am . Patient instructed to go to [facility] ER. Summary: Largest Aortic Diameter: 3.2 cm  Renal:  Right: Abnormal size for the right kidney. Abnormal right Resistive        Index. RRV flow present. Normal cortical thickness of right        kidney. 1-59% stenosis of the right renal arterial proximal        stent. Hyperechoic renal echotexture with decreased overall        color flow perfusion. Left:  Abnormal size for the left kidney. Abnormal left Resisitve        Index. Normal cortical thickness of the left kidney. LRV flow        present. No evidence of left renal artery stenosis.        Hyperechoic renal echotexture with decreased overall color        flow perfusion. Mesenteric: Areas of limited visceral study include left renal artery. Limited exam due to patient's inability to hold his breath and elevated respiratory movement.  Fluid collection anterior to proximal/mid aorta measuring 5.3 x 2.9 x 3.5 cm, question post surgical  *See table(s) above for measurements and observations.  Diagnosing physician: Carlyle Dolly MD  Electronically signed by Carlyle Dolly MD on 05/15/2020 at 10:03:26 AM.    Final     (Echo, Carotid, EGD, Colonoscopy, ERCP)    Subjective: Patient resting in bed complains of a headache otherwise no new complaints  Discharge Exam: Vitals:   05/18/20 2118 05/19/20 0453  BP: 106/62 132/79  Pulse: 70 73  Resp: 16 18  Temp: 98.9 F (37.2 C) 98.3 F (36.8 C)  SpO2: 98% 98%   Vitals:  05/18/20 1102 05/18/20 1418 05/18/20 2118 05/19/20 0453  BP: (!) 155/90 118/68 106/62 132/79  Pulse:  81 70 73  Resp:  18 16 18   Temp:  98.8 F (37.1 C) 98.9 F (37.2 C) 98.3 F (36.8 C)  TempSrc:  Oral Oral Oral  SpO2:  98% 98% 98%  Weight:      Height:        General: Pt is alert, awake, not in acute distress Cardiovascular: RRR, S1/S2 +, no rubs, no gallops, chest wall tenderness left lower ribs Respiratory: CTA bilaterally, no wheezing, no  rhonchi Abdominal: Soft, NT, ND, bowel sounds + Extremities: no edema, no cyanosis    The results of significant diagnostics from this hospitalization (including imaging, microbiology, ancillary and laboratory) are listed below for reference.     Microbiology: Recent Results (from the past 240 hour(s))  Resp Panel by RT-PCR (Flu A&B, Covid) Nasopharyngeal Swab     Status: None   Collection Time: 05/17/20  5:47 PM   Specimen: Nasopharyngeal Swab; Nasopharyngeal(NP) swabs in vial transport medium  Result Value Ref Range Status   SARS Coronavirus 2 by RT PCR NEGATIVE NEGATIVE Final    Comment: (NOTE) SARS-CoV-2 target nucleic acids are NOT DETECTED.  The SARS-CoV-2 RNA is generally detectable in upper respiratory specimens during the acute phase of infection. The lowest concentration of SARS-CoV-2 viral copies this assay can detect is 138 copies/mL. A negative result does not preclude SARS-Cov-2 infection and should not be used as the sole basis for treatment or other patient management decisions. A negative result may occur with  improper specimen collection/handling, submission of specimen other than nasopharyngeal swab, presence of viral mutation(s) within the areas targeted by this assay, and inadequate number of viral copies(<138 copies/mL). A negative result must be combined with clinical observations, patient history, and epidemiological information. The expected result is Negative.  Fact Sheet for Patients:  EntrepreneurPulse.com.au  Fact Sheet for Healthcare Providers:  IncredibleEmployment.be  This test is no t yet approved or cleared by the [place] FDA and  has been authorized for detection and/or diagnosis of SARS-CoV-2 by FDA under an Emergency Use Authorization (EUA). This EUA will remain  in effect (meaning this test can be used) for the duration of the COVID-19 declaration under Section 564(b)(1) of the Act,  21 U.S.C.section 360bbb-3(b)(1), unless the authorization is terminated  or revoked sooner.       Influenza A by PCR NEGATIVE NEGATIVE Final   Influenza B by PCR NEGATIVE NEGATIVE Final    Comment: (NOTE) The Xpert Xpress SARS-CoV-2/FLU/RSV plus assay is intended as an aid in the diagnosis of influenza from Nasopharyngeal swab specimens and should not be used as a sole basis for treatment. Nasal washings and aspirates are unacceptable for Xpert Xpress SARS-CoV-2/FLU/RSV testing.  Fact Sheet for Patients: EntrepreneurPulse.com.au  Fact Sheet for Healthcare Providers: IncredibleEmployment.be  This test is not yet approved or cleared by the [place] FDA and has been authorized for detection and/or diagnosis of SARS-CoV-2 by FDA under an Emergency Use Authorization (EUA). This EUA will remain in effect (meaning this test can be used) for the duration of the COVID-19 declaration under Section 564(b)(1) of the Act, 21 U.S.C. section 360bbb-3(b)(1), unless the authorization is terminated or revoked.  Performed at [facility], [place] [address] [place], [place] [address]      Labs: BNP (last 3 results) Recent Labs    05/17/20 1546  BNP 427.0*   Basic Metabolic Panel: Recent Labs  Lab  05/15/20 1441 05/17/20 1546 05/18/20 0150  NA 140 139 139  K 4.0 3.7 3.8  CL 111 109 110  CO2 20* 21* 23  GLUCOSE 97 97 93  BUN 30* 33* 32*  CREATININE 3.90* 3.92* 4.01*  CALCIUM 8.9 9.0 9.0   Liver Function Tests: Recent Labs  Lab 05/17/20 1601 05/18/20 0150  AST 17 16  ALT 11 11  ALKPHOS 70 62  BILITOT 0.8 0.9  PROT 7.2 6.7  ALBUMIN 3.9 3.7   Recent Labs  Lab 05/17/20 1601  LIPASE 43   No results for input(s): AMMONIA in the last 168 hours. CBC: Recent Labs  Lab 05/15/20 1441 05/17/20 1546 05/18/20 0150  WBC 5.4 5.4 6.2  HGB 12.7* 12.0* 11.5*  HCT 37.8* 36.1* 35.0*  MCV 95.5 94.3 96.4  PLT 106* 102* 96*    Cardiac Enzymes: No results for input(s): CKTOTAL, CKMB, CKMBINDEX, TROPONINI in the last 168 hours. BNP: Invalid input(s): POCBNP CBG: Recent Labs  Lab 05/18/20 0458  GLUCAP 92   D-Dimer No results for input(s): DDIMER in the last 72 hours. Hgb A1c No results for input(s): HGBA1C in the last 72 hours. Lipid Profile Recent Labs    05/17/20 2141  CHOL 119  HDL 57  LDLCALC 55  TRIG 37  CHOLHDL 2.1   Thyroid function studies Recent Labs    05/17/20 2141  TSH 2.804   Anemia work up No results for input(s): VITAMINB12, FOLATE, FERRITIN, TIBC, IRON, RETICCTPCT in the last 72 hours. Urinalysis    Component Value Date/Time   COLORURINE YELLOW 01/31/2017 [place] 01/31/2017 1203   LABSPEC >1.046 (H) 01/31/2017 1203   PHURINE 5.0 01/31/2017 1203   GLUCOSEU NEGATIVE 01/31/2017 1203   HGBUR NEGATIVE 01/31/2017 1203   HGBUR negative 02/18/2009 0901   BILIRUBINUR NEGATIVE 01/31/2017 1203   KETONESUR NEGATIVE 01/31/2017 1203   PROTEINUR 100 (A) 01/31/2017 1203   UROBILINOGEN 0.2 01/02/2015 1945   NITRITE NEGATIVE 01/31/2017 1203   LEUKOCYTESUR NEGATIVE 01/31/2017 1203   Sepsis Labs Invalid input(s): PROCALCITONIN,  WBC,  LACTICIDVEN Microbiology Recent Results (from the past 240 hour(s))  Resp Panel by RT-PCR (Flu A&B, Covid) Nasopharyngeal Swab     Status: None   Collection Time: 05/17/20  5:47 PM   Specimen: Nasopharyngeal Swab; Nasopharyngeal(NP) swabs in vial transport medium  Result Value Ref Range Status   SARS Coronavirus 2 by RT PCR NEGATIVE NEGATIVE Final    Comment: (NOTE) SARS-CoV-2 target nucleic acids are NOT DETECTED.  The SARS-CoV-2 RNA is generally detectable in upper respiratory specimens during the acute phase of infection. The lowest concentration of SARS-CoV-2 viral copies this assay can detect is 138 copies/mL. A negative result does not preclude SARS-Cov-2 infection and should not be used as the sole basis for treatment  or other patient management decisions. A negative result may occur with  improper specimen collection/handling, submission of specimen other than nasopharyngeal swab, presence of viral mutation(s) within the areas targeted by this assay, and inadequate number of viral copies(<138 copies/mL). A negative result must be combined with clinical observations, patient history, and epidemiological information. The expected result is Negative.  Fact Sheet for Patients:  EntrepreneurPulse.com.au  Fact Sheet for Healthcare Providers:  IncredibleEmployment.be  This test is no t yet approved or cleared by the [place] FDA and  has been authorized for detection and/or diagnosis of SARS-CoV-2 by FDA under an Emergency Use Authorization (EUA). This EUA will remain  in effect (meaning this test can be used)  for the duration of the COVID-19 declaration under Section 564(b)(1) of the Act, 21 U.S.C.section 360bbb-3(b)(1), unless the authorization is terminated  or revoked sooner.       Influenza A by PCR NEGATIVE NEGATIVE Final   Influenza B by PCR NEGATIVE NEGATIVE Final    Comment: (NOTE) The Xpert Xpress SARS-CoV-2/FLU/RSV plus assay is intended as an aid in the diagnosis of influenza from Nasopharyngeal swab specimens and should not be used as a sole basis for treatment. Nasal washings and aspirates are unacceptable for Xpert Xpress SARS-CoV-2/FLU/RSV testing.  Fact Sheet for Patients: EntrepreneurPulse.com.au  Fact Sheet for Healthcare Providers: IncredibleEmployment.be  This test is not yet approved or cleared by the [place] FDA and has been authorized for detection and/or diagnosis of SARS-CoV-2 by FDA under an Emergency Use Authorization (EUA). This EUA will remain in effect (meaning this test can be used) for the duration of the COVID-19 declaration under Section 564(b)(1) of the Act, 21 U.S.C. section  360bbb-3(b)(1), unless the authorization is terminated or revoked.  Performed at [facility], [place] [address] [place], [place] [address]      Time coordinating discharge:  38 minutes  SIGNED:   Georgette Shell, MD  [facility] 05/19/2020, 8:48 AM

## 2020-05-19 NOTE — Progress Notes (Signed)
Contacted the provider on call about patient's blood pressure medication due to patient taking 3 at bedtime and the patient's blood pressure being 106/62. Nurse was concerned that giving the meds would cause patient's blood pressure to bottom out. Provider on call gave parameters for the 3 blood pressure medications.

## 2020-05-19 NOTE — Plan of Care (Signed)

## 2020-05-20 ENCOUNTER — Encounter: Payer: Medicare HMO | Admitting: Internal Medicine

## 2020-05-20 ENCOUNTER — Telehealth: Payer: Self-pay

## 2020-05-20 ENCOUNTER — Inpatient Hospital Stay (HOSPITAL_COMMUNITY): Admission: RE | Admit: 2020-05-20 | Payer: Medicare HMO | Source: Ambulatory Visit

## 2020-05-20 DIAGNOSIS — I639 Cerebral infarction, unspecified: Secondary | ICD-10-CM

## 2020-05-20 NOTE — Telephone Encounter (Signed)
Transition Care Management Follow-up Telephone Call  Date of discharge and from where: 05/19/2020, [facility]   How have you been since you were released from the hospital? Doing okay, no concerns reported.   Any questions or concerns? No  Items Reviewed:  Did the pt receive and understand the discharge instructions provided? Yes   Medications obtained and verified? Yes , he said that he has all medications and did not have any questions about the med regime.   Other? No   Any new allergies since your discharge? No   Do you have support at home? Yes   Home Care and Equipment/Supplies: Were home health services ordered? no If so, what is the name of the agency? n/a  Has the agency set up a time to come to the patient's home? not applicable Were any new equipment or medical supplies ordered?  No What is the name of the medical supply agency? n/a Were you able to get the supplies/equipment? not applicable Do you have any questions related to the use of the equipment or supplies? No  Functional Questionnaire: (I = Independent and D = Dependent) ADLs:independent  Follow up appointments reviewed:   PCP Hospital f/u appt confirmed? Yes  Juluis Mire, NP 05/28/2020.   [facility] f/u appt confirmed? Yes  - CHF clinic 06/10/2020.   Are transportation arrangements needed? Yes   If their condition worsens, is the pt aware to call PCP or go to the Emergency Dept.? Yes  Was the patient provided with contact information for the PCP's office or ED? Yes  Was to pt encouraged to call back with questions or concerns? Yes

## 2020-05-21 ENCOUNTER — Telehealth: Payer: Self-pay | Admitting: Internal Medicine

## 2020-05-21 NOTE — Telephone Encounter (Signed)
Seen at hospital

## 2020-05-21 NOTE — Telephone Encounter (Signed)
PA for repatha submitted via CMM (Key: [id number])  PA Case: [id number], Status: Approved, Coverage Starts on: 03/16/2020 12:00:00 AM, Coverage Ends on: 03/15/2021 12:00:00 AM.

## 2020-05-23 ENCOUNTER — Other Ambulatory Visit (HOSPITAL_COMMUNITY): Payer: Self-pay

## 2020-05-23 MED ORDER — AMLODIPINE BESYLATE 10 MG PO TABS: 10.0000 mg | ORAL_TABLET | Freq: Every day | ORAL | 1 refills | Status: DC

## 2020-05-28 ENCOUNTER — Ambulatory Visit (INDEPENDENT_AMBULATORY_CARE_PROVIDER_SITE_OTHER): Payer: Medicare HMO | Admitting: Primary Care
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
## 2020-06-10 ENCOUNTER — Encounter (HOSPITAL_COMMUNITY): Payer: Medicare HMO

## 2020-06-20 ENCOUNTER — Other Ambulatory Visit: Payer: Self-pay | Admitting: Cardiovascular Disease

## 2020-07-23 ENCOUNTER — Ambulatory Visit (HOSPITAL_COMMUNITY)
Admission: RE | Admit: 2020-07-23 | Discharge: 2020-07-23 | Disposition: A | Discharge status: Home / Self Care | Payer: Medicare HMO | Source: Ambulatory Visit | Attending: Cardiology | Admitting: Cardiology

## 2020-07-23 ENCOUNTER — Other Ambulatory Visit (HOSPITAL_COMMUNITY): Payer: Self-pay | Admitting: Cardiology

## 2020-07-23 ENCOUNTER — Encounter (HOSPITAL_COMMUNITY): Payer: Self-pay

## 2020-07-23 ENCOUNTER — Other Ambulatory Visit: Payer: Self-pay

## 2020-07-23 VITALS — BP 148/88 | HR 71 | Wt 158.8 lb

## 2020-07-23 DIAGNOSIS — I130 Hypertensive heart and chronic kidney disease with heart failure and stage 1 through stage 4 chronic kidney disease, or unspecified chronic kidney disease: Secondary | ICD-10-CM | POA: Insufficient documentation

## 2020-07-23 DIAGNOSIS — I5022 Chronic systolic (congestive) heart failure: Secondary | ICD-10-CM

## 2020-07-23 DIAGNOSIS — Z95828 Presence of other vascular implants and grafts: Secondary | ICD-10-CM | POA: Insufficient documentation

## 2020-07-23 DIAGNOSIS — E785 Hyperlipidemia, unspecified: Secondary | ICD-10-CM | POA: Insufficient documentation

## 2020-07-23 DIAGNOSIS — F17210 Nicotine dependence, cigarettes, uncomplicated: Secondary | ICD-10-CM | POA: Insufficient documentation

## 2020-07-23 DIAGNOSIS — Z885 Allergy status to narcotic agent status: Secondary | ICD-10-CM | POA: Diagnosis not present

## 2020-07-23 DIAGNOSIS — I701 Atherosclerosis of renal artery: Secondary | ICD-10-CM | POA: Insufficient documentation

## 2020-07-23 DIAGNOSIS — Z8249 Family history of ischemic heart disease and other diseases of the circulatory system: Secondary | ICD-10-CM | POA: Insufficient documentation

## 2020-07-23 DIAGNOSIS — I2510 Atherosclerotic heart disease of native coronary artery without angina pectoris: Secondary | ICD-10-CM | POA: Insufficient documentation

## 2020-07-23 DIAGNOSIS — N184 Chronic kidney disease, stage 4 (severe): Secondary | ICD-10-CM | POA: Insufficient documentation

## 2020-07-23 DIAGNOSIS — I252 Old myocardial infarction: Secondary | ICD-10-CM | POA: Diagnosis not present

## 2020-07-23 DIAGNOSIS — Z7984 Long term (current) use of oral hypoglycemic drugs: Secondary | ICD-10-CM | POA: Insufficient documentation

## 2020-07-23 DIAGNOSIS — Z95818 Presence of other cardiac implants and grafts: Secondary | ICD-10-CM | POA: Insufficient documentation

## 2020-07-23 DIAGNOSIS — Z888 Allergy status to other drugs, medicaments and biological substances status: Secondary | ICD-10-CM | POA: Diagnosis not present

## 2020-07-23 DIAGNOSIS — I5042 Chronic combined systolic (congestive) and diastolic (congestive) heart failure: Secondary | ICD-10-CM | POA: Diagnosis not present

## 2020-07-23 DIAGNOSIS — I5032 Chronic diastolic (congestive) heart failure: Secondary | ICD-10-CM

## 2020-07-23 DIAGNOSIS — Z8673 Personal history of transient ischemic attack (TIA), and cerebral infarction without residual deficits: Secondary | ICD-10-CM | POA: Insufficient documentation

## 2020-07-23 DIAGNOSIS — Z953 Presence of xenogenic heart valve: Secondary | ICD-10-CM | POA: Insufficient documentation

## 2020-07-23 DIAGNOSIS — Z79899 Other long term (current) drug therapy: Secondary | ICD-10-CM | POA: Insufficient documentation

## 2020-07-23 DIAGNOSIS — Z7982 Long term (current) use of aspirin: Secondary | ICD-10-CM | POA: Insufficient documentation

## 2020-07-23 LAB — BASIC METABOLIC PANEL
Anion gap: 4 — ABNORMAL LOW (ref 5–15)
BUN: 39 mg/dL — ABNORMAL HIGH (ref 6–20)
CO2: 22 mmol/L (ref 22–32)
Calcium: 8.9 mg/dL (ref 8.9–10.3)
Chloride: 112 mmol/L — ABNORMAL HIGH (ref 98–111)
Creatinine, Ser: 4.43 mg/dL — ABNORMAL HIGH (ref 0.61–1.24)
GFR, Estimated: 16 mL/min — ABNORMAL LOW (ref >60)
Glucose, Bld: 84 mg/dL (ref 70–99)
Potassium: 4.3 mmol/L (ref 3.5–5.1)
Sodium: 138 mmol/L (ref 135–145)

## 2020-07-23 LAB — CBC
HCT: 38.4 % — ABNORMAL LOW (ref 39.0–52.0)
Hemoglobin: 12.9 g/dL — ABNORMAL LOW (ref 13.0–17.0)
MCH: 31.3 pg (ref 26.0–34.0)
MCHC: 33.6 g/dL (ref 30.0–36.0)
MCV: 93.2 fL (ref 80.0–100.0)
Platelets: 130 10*3/uL — ABNORMAL LOW (ref 150–400)
RBC: 4.12 MIL/uL — ABNORMAL LOW (ref 4.22–5.81)
RDW: 13.4 % (ref 11.5–15.5)
WBC: 4.8 10*3/uL (ref 4.0–10.5)
nRBC: 0 % (ref 0.0–0.2)

## 2020-07-23 LAB — BRAIN NATRIURETIC PEPTIDE: B Natriuretic Peptide: 334.1 pg/mL — ABNORMAL HIGH (ref 0.0–100.0)

## 2020-07-23 LAB — HEMOGLOBIN A1C
Hgb A1c MFr Bld: 5.4 % (ref 4.8–5.6)
Mean Plasma Glucose: 108.28 mg/dL

## 2020-07-23 MED ORDER — FUROSEMIDE 20 MG PO TABS: 20.0000 mg | ORAL_TABLET | Freq: Every day | ORAL | 11 refills | Status: DC

## 2020-07-23 MED ORDER — LIDOCAINE 5 % EX PTCH: 1.0000 | MEDICATED_PATCH | Freq: Two times a day (BID) | CUTANEOUS | 0 refills | Status: DC

## 2020-07-23 MED ORDER — HYDRALAZINE HCL 50 MG PO TABS
50.0000 mg | ORAL_TABLET | Freq: Three times a day (TID) | ORAL | 3 refills | Status: DC
Start: 2020-07-23 — End: 2020-08-30

## 2020-07-23 NOTE — Patient Instructions (Signed)
INCREASE Hydralazine to 50 mg, one tan three times per day START Lasix 20 mg, one tab daily  Labs today We will only contact you if something comes back abnormal or we need to make some changes. Otherwise no news is good news!  Labs needed in 7-10 days  Your physician recommends that you schedule a follow-up appointment in: 2-3 weeks  in the Advanced Practitioners (PA/NP) Clinic    Do the following things EVERYDAY: 1) Weigh yourself in the morning before breakfast. Write it down and keep it in a log. 2) Take your medicines as prescribed 3) Eat low salt foods--Limit salt (sodium) to 2000 mg per day.  4) Stay as active as you can everyday 5) Limit all fluids for the day to less than 2 liters  At the [facility], you and your health needs are our priority. As part of our continuing mission to provide you with exceptional heart care, we have created designated Provider Care Teams. These Care Teams include your primary Cardiologist (physician) and Advanced Practice Providers (APPs- Physician Assistants and Nurse Practitioners) who all work together to provide you with the care you need, when you need it.   You may see any of the following providers on your designated Care Team at your next follow up: [name] Dr Glori Bickers . Dr [name] . Dr Vickki Muff . Darrick Grinder, NP . Lyda Jester, [place] . Audry Riles, PharmD   Please be sure to bring in all your medications bottles to every appointment.   If you have any questions or concerns before your next appointment please send [name] a message through [place] or call our office at [phone].    TO LEAVE A MESSAGE FOR THE NURSE SELECT OPTION 2, PLEASE LEAVE A MESSAGE INCLUDING: . YOUR NAME . DATE OF BIRTH . CALL BACK NUMBER . REASON FOR CALL**this is important as we prioritize the call backs  YOU WILL RECEIVE A CALL BACK THE SAME DAY AS LONG AS YOU CALL BEFORE 4:00 PM

## 2020-07-23 NOTE — Progress Notes (Addendum)
Date:  07/25/2020   ID:  [name], DOB 01/16/1971, MRN [id number]   Provider location: [place] [facility] Type of Visit: Established patient   PCP:  Kerin Perna, NP  Cardiologist:  Dr. Aundra Dubin   History of Present Illness: [name] is a 50 y.o. 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 The descending thoracic aorta has dilated to 5.1 cm. Cardiac catheterization in April 2016 demonstrated 60% first diagonal lesion. He underwent aortic valve replacement with bovine pericardial tissue valve and reconstruction along with aortic root replacement and endovascular repair of the descending thoracic aortic aneurysm at [facility]. He has had intermittent chest discomfort since then.   Admitted in 12/02/2017 w/ chest pain. Had NSTEMI. He had LHC with stent placed proximal ramus.  Repeat cath in 7/20 with nonobstructive disease.   He was admitted in 9/21 with atypical chest pain.  Echo showed EF 60-65% with severe LVH, normal RV, bioprosthetic aortic valve with mean gradient 18 mmHg.  Cardiolite showed no ischemia.   Last visit w/ Dr. Aundra Dubin 2/22, he continued to complain of chronic non-exertional CP. Symptoms c/w GERD, however not relived w/ PPI. He was referred to GI for further evaluation. CXR was also obtained and unremarkable. His BP had been elevated and meds adjusted. Hydralazine 25 mg tid was added and he was ordered to get renal artery dopplers to assess for RAS. This showed moderate RAS on the rt w/ 1-59% stenosis. The left RA was normal. He was scheduled for return f/u w/  PharmD 3/22 for reassessment of BP and further med titration, if needed, but he no showed.   He ended up going to the ED on 3/4 for CP. EKG showed no changes. Found to have elevated HS trop of 136 (prior baseline levels 60-80). This was also in the setting of worsening SCr, up to 3.92. General cardiology was consulted. Echo was completed and showed preserved LVEFsevere LVH, increased mean gradient of bioprosthetic AVR, 31 mmHg (see full report below). It was noted that he had diffuse tenderness/pain on exam of chest, and there are palpable ribs that are displaced/pop when palpated. He was given trial of topical lidoderm patch, which helped some.  His elevated troponins and pain were not consistent with NSTEMI, suspect that this is due to stress from pain and worsening CKD. He was discharged home w/ Lidoderm patch for pain.   He returns to clinic today for routien f/u for dHF. Still w/ left sided chest/rib pain worse w/ palpation. BP improved but remains mildly elevated, 148/88.  Volume status stable on exam, though ReDS clip reading elevated. NYHA Class II. Denies resting dyspnea. Reports good med compliance. Despite Stage IV CKD, he reports normal urine output. We discussed how he was lost to f/u at [facility] and he plans to schedule an appointment to re-establish care. He has also been referred to [facility] and is awaiting appt.    Echo 05/2020  1. Left ventricular ejection fraction, by estimation, is 60 to 65%. The left ventricle has normal function. The left ventricle has no  regional wall motion abnormalities. There is severe left ventricular hypertrophy. Left ventricular diastolic parameters are consistent with Grade II diastolic dysfunction (pseudonormalization). Elevated left ventricular end-diastolic pressure. The average left ventricular global longitudinal strain is -14.2 %. The global longitudinal strain is abnormal. 2. Right ventricular systolic function is normal. The right ventricular size is  normal. Tricuspid regurgitation signal is inadequate for assessing PA pressure. 3. Left atrial size was severely dilated. 4. Right atrial size was moderately dilated. 5. A small pericardial effusion is present. There is no evidence of cardiac tamponade. 6. The mitral valve is normal in structure. Trivial mitral valve regurgitation. No evidence of mitral stenosis. 7. The aortic valve has been repaired/replaced. Aortic valve regurgitation is not visualized. Moderate aortic valve stenosis. There is a bioprosthetic valve present in the aortic position. Procedure Date: 2016. Aortic valve mean gradient measures 31.7 mmHg. 8. Aortic root/ascending aorta has been repaired/replaced. 9. The inferior vena cava is normal in size with greater than 50% respiratory variability, suggesting right atrial pressure of 3 mmHg.  ECG (personally reviewed): NSR, LVH with repolarization abnormality.   Labs (10/19): K 4.1, creatinine 1.78, myeloma panel negative Labs (2/20): K 4.2, creatinine 1.75, LDL 128 Labs (7/21): LDL 66 Labs (9/21): K 4.1, creatinine 3.31, hgb 11.9 Labs (10/21): K 4, creatinine 2.96, hgb 12.8 Labs (2/22): SCr 3.90, 4.0, hgb 12.7 Labs (3/22): SCr 3.92, 3.7, hgb 12.0 Labs (3/22): Scr 4.01, 3.8, hgb 11.5    Review of Systems: All systems reviewed and negative except as per HPI.   PMH: 1. H/o ankle fractures bilaterally 2. GERD 3. HTN: Negative urinary catecholeamine collection for pheochromocytoma.  4. CKD: Stage 3. Suspect hypertensive nephropathy.  5. Cardiomyopathy: Most likely due to heavy ETOH ingestion and uncontrolled HTN. 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 Lexiscan myoview (4/13): EF 37%, global hypokinesis, no ischemic or infarction. Echo (12/14) with EF 45-50%, diffuse hypokinesis, moderate LVH, mild MR, moderate AI. Echo (4/16) with EF  55-60%, severe LVH, mild LV dilation, moderate AI and moderate MR.  - Echo (9/19): EF 35-40% with diffuse hypokinesis, severe LVH, bioprosthetic aortic valve functioning normally.  - PYP scan (1/20): No evidence for TTR amyloidosis.  - Echo (9/21): EF 60-65%, severe LVH, normal RV, bioprosthetic aortic valve with mean gradient 18 mmHg.  6. ETOH abuse: Quit 3/13.  7. Active smoker. 8. Valvular heart disease: Echo in 3/13 showed moderate to severe eccentric MR and mild to moderate AI with prolapsing left coronary cusp. Echo 12/14 with mild MR and moderate AI. Echo 4/16 with moderate AI and moderate MR.  - S/p Bentall procedure with bioprosthetic aortic valve 9/16.  9.  CVA: Has ILR. 10. Type B aortic dissection: Occurred in 2/16, from just distal to the left subclavian. 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   - 10/03/2014 First stage median sternotomy with right axillary cannulation for aortic valved conduit root replacement with coronary reconstruction (button Bentall Procedure), 27 mm Sorin Mitroflow bovine pericardial valved conduit, total arch replacement (61m Vascutek BSomaliamultibranch arch graft with individual head vessel reimplantation. - Status post open repair of an Extent III TAA secondary to chronic dissection with a # [id number] "Coselli" multibranch Dacron graft with individual celiac, SMA, and bilateral renal artery implantationon 01/04/2017, [facility].  11. CAD: LHC (4/16) with 60% ostial D1.  - NSTEMI 9/19: LHC (9/19) showed 50% proximal LCx,  90% ramus => DES to ramus.  - LHC (7/20): D1 60% stenosis, patent ramus stent.  - Cardiolite (9/21): Inferior fixed defect, no ischemia.  12. Sleep study (10/21) with no significant OSA.   Current Outpatient Medications  Medication Sig Dispense Refill  . acetaminophen (TYLENOL) 325 MG tablet Take 650 mg by mouth every 6 (six) hours as needed for moderate pain (for pain or headaches).    [name] albuterol (VENTOLIN  HFA) 108 (90 Base) MCG/ACT inhaler Inhale 2 puffs into the lungs every 6 (six) hours as needed for wheezing or shortness of breath.    [name] amLODipine (NORVASC) 10 MG tablet Take 1 tablet (10 mg total) by mouth daily. 30 tablet 1  . aspirin EC 81 MG EC tablet Take 1 tablet (81 mg total) by mouth daily. 90 tablet 3  . atorvastatin (LIPITOR) 80 MG tablet Take 1 tablet (80 mg total) by mouth daily. 30 tablet 2  . Blood Pressure Monitor KIT 1 Device by Does not apply route 2 (two) times daily. 1 kit 0  . carvedilol (COREG) 25 MG tablet Take 25 mg by mouth 2 (two) times daily with a meal.    . cloNIDine (CATAPRES - DOSED IN MG/24 HR) 0.3 mg/24hr patch Place 1 patch (0.3 mg total) onto the skin once a week. 4 patch 6  . doxazosin (CARDURA) 8 MG tablet TAKE 2 TABLETS(16 MG) BY MOUTH AT BEDTIME 180 tablet 1  . empagliflozin (JARDIANCE) 10 MG TABS tablet Take 1 tablet (10 mg total) by mouth daily. 30 tablet 11  . Evolocumab (REPATHA SURECLICK) 753 MG/ML SOAJ Inject 1 Dose into the skin every 14 (fourteen) days. 2 mL 11  . furosemide (LASIX) 20 MG tablet Take 1 tablet (20 mg total) by mouth daily. 30 tablet 11  . MINOXIDIL PO Take 20 mg by mouth daily.    . nicotine (NICODERM CQ - DOSED IN MG/24 HOURS) 14 mg/24hr patch Place 1 patch (14 mg total) onto the skin daily. 28 patch 0  . pantoprazole (PROTONIX) 40 MG tablet Take 1 tablet (40 mg total) by mouth daily. 30 tablet 6  . STIOLTO RESPIMAT 2.5-2.5 MCG/ACT AERS INHALE 2 PUFFS INTO THE LUNGS DAILY 4 g 5  . hydrALAZINE (APRESOLINE) 50 MG tablet Take 1 tablet (50 mg total) by mouth 3 (three) times daily. 270 tablet 3  . lidocaine (LIDODERM) 5 % PLACE 1 PATCH ONTO THE SKIN EVERY 12 HOURS REMOVE AND DISCARD PATCH WITHIN 12 HOURS OR AS DIRECTED BY MD 180 patch 3   No current facility-administered medications for this encounter.    Allergies:   Imdur [isosorbide nitrate] and Tramadol   Social History:  The patient  reports that he has been smoking cigarettes. He  has a 5.75 pack-year smoking history. He has never used smokeless tobacco. He reports current alcohol use of about 1.0 standard drink of alcohol per week. He reports current drug use. Drug: Marijuana.   Family History:  The patient's family history includes Colon cancer in his paternal uncle; Diabetes in his maternal aunt; Headache in his sister; Heart attack in his brother; Hypertension in his brother, mother, and another family member; Lung cancer in his maternal uncle; Other in his sister; Stroke in his maternal aunt and maternal uncle; Thyroid disease in his sister.   ROS:  Please see the history of present illness.   All other systems are personally reviewed and negative.   Exam:   BP (!) 148/88   Pulse 71   Wt  72 kg (158 lb 12.8 oz)   SpO2 100%   BMI 21.54 kg/m  PHYSICAL EXAM: General:  Well appearing. No respiratory difficulty HEENT: normal Neck: supple. no JVD. Carotids 2+ bilat; no bruits. No lymphadenopathy or thyromegaly appreciated. Cor: PMI nondisplaced. Regular rate & rhythm. 2/6 murmur  Lungs: clear no wheezing  Abdomen: soft, nontender, nondistended. No hepatosplenomegaly. No bruits or masses. Good bowel sounds. Extremities: no cyanosis, clubbing, rash, edema Neuro: alert & oriented x 3, cranial nerves grossly intact. moves all 4 extremities w/o difficulty. Affect pleasant.   Recent Labs: 05/17/2020: TSH 2.804 05/18/2020: ALT 11 07/23/2020: B Natriuretic Peptide 334.1; BUN 39; Creatinine, Ser 4.43; Hemoglobin 12.9; Platelets 130; Potassium 4.3; Sodium 138  Personally reviewed   Wt Readings from Last 3 Encounters:  07/23/20 72 kg (158 lb 12.8 oz)  05/17/20 74.4 kg (164 lb)  04/26/20 73.9 kg (163 lb)      ASSESSMENT AND PLAN:  1. CAD: 9/19 NSTEMI with PCI to proximal ramus.  LHC in 7/20 with no obstructive disease.  Admission in 9/21 with Cardiolite showing no ischemia. Recent Admit 3/22 for CP, suspected to be musculoskeletal.  He has long-standing primarily  non-exertional chest pain.  I suspect that it may be musculoskeletal and related to his prior surgeries. No anginal symptomatology.   - he plans to return to [facility] for f/u  - ? Any benefit from nerve block for chronic pain - Continue ASA 81 +  blocker - He is on Repatha via the lipid clinic.  2. 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 Myeloma panel negative and PYP scan negative, suspect due more likely to HTN than hypertrophic cardiomyopathy.  Cannot get cardiac MRI with elevated creatinine.  Echo in 9/21 showed EF up to 60-65%, severe LVH, normal RV. Recent echo 3/22 EF 60-65%, severe LVH, RV normal. NYHA class II symptoms. Euvolemic on exam though ReDS clip reading is elevated, Doubt accurate.  - will check BNP to better assess volume status  - Continue Coreg 25 mg bid.  - He is off spironolactone and Entresto with CKD stage IV.  3. HTN: Still needs better control.  Sleep study in 10/21 was negative for OSA.  - Continue amlodipine 10 mg daily, minoxidil, Coreg, and clonidine.  - Increase hydralazine to 50 mg tid. He does not have other good options for antihypertensives.  - Renal artery dopplers completed 3/22 to rule out renal artery stenosis and showed mild Rt RAS 1-59%. No Lt sided RAS.  4. S/P Bioprothetic AVR: Echo in 3/22 with increase in mean gradient (31 mmHg). Monitor annually or sooner if clinically indicated   - SBE prophylaxis w/ dental work  5. S/P 2016 and 2018 thoracic aortic aneurysm repair: Followed by Dr. [name] at [facility].  - lost to f/u. He plans to call to make an appointment  6. CKD Stage IV: New SCr baseline closer to 4 range, nonoliguric.  BUN/K has been stable  - he has been referred to [facility] today.    7. Active smoker:  Smoking cessation advised, he has been cutting back    Signed, Lyda Jester, PA-C  07/25/2020  [facility] [address] [facility]  [place] [address] [phone] (office) [phone] (fax)

## 2020-07-23 NOTE — Progress Notes (Signed)
ReDS Vest / Clip - 07/23/20 1100      ReDS Vest / Clip   Station Marker C    Ruler Value 29    ReDS Value Range High volume overload    ReDS Actual Value 46

## 2020-07-25 ENCOUNTER — Encounter (HOSPITAL_COMMUNITY): Payer: Self-pay

## 2020-07-30 ENCOUNTER — Other Ambulatory Visit: Payer: Self-pay

## 2020-07-30 ENCOUNTER — Ambulatory Visit (HOSPITAL_COMMUNITY)
Admission: RE | Admit: 2020-07-30 | Discharge: 2020-07-30 | Disposition: A | Discharge status: Home / Self Care | Payer: Medicare HMO | Source: Ambulatory Visit | Attending: Cardiology | Admitting: Cardiology

## 2020-07-30 DIAGNOSIS — I5032 Chronic diastolic (congestive) heart failure: Secondary | ICD-10-CM | POA: Diagnosis not present

## 2020-07-30 LAB — BASIC METABOLIC PANEL
Anion gap: 8 (ref 5–15)
BUN: 49 mg/dL — ABNORMAL HIGH (ref 6–20)
CO2: 22 mmol/L (ref 22–32)
Calcium: 8.9 mg/dL (ref 8.9–10.3)
Chloride: 108 mmol/L (ref 98–111)
Creatinine, Ser: 4.8 mg/dL — ABNORMAL HIGH (ref 0.61–1.24)
GFR, Estimated: 14 mL/min — ABNORMAL LOW (ref >60)
Glucose, Bld: 103 mg/dL — ABNORMAL HIGH (ref 70–99)
Potassium: 3.9 mmol/L (ref 3.5–5.1)
Sodium: 138 mmol/L (ref 135–145)

## 2020-07-30 IMAGING — CT CT HEAD W/O CM
4 series · 16 of 47 positions shown, 18 images · non-contrast
Comparison: Brain MRI 03/03/2016

CLINICAL DATA: Chest pain for 2 days with nausea and headache.

EXAM:
CT HEAD WITHOUT CONTRAST
TECHNIQUE: Contiguous axial images were obtained from the base of the skull
through the vertex without intravenous contrast.

[Series 3: head without · axial · non-contrast · 0.39mm/px · z∈[-112,-2]mm · 7 of 30 slices shown, 9 images]
[im 4/30  brain]
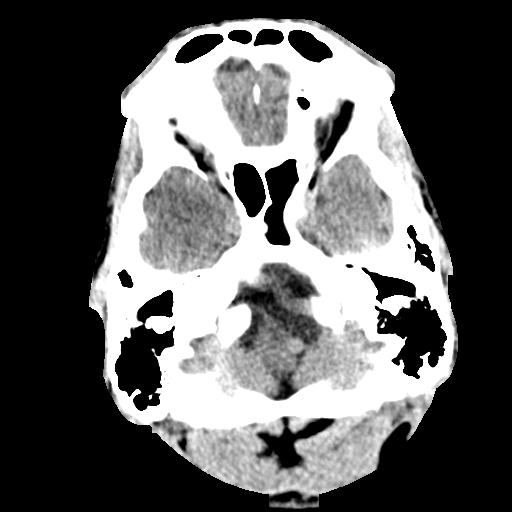
[im 4/30  bone]
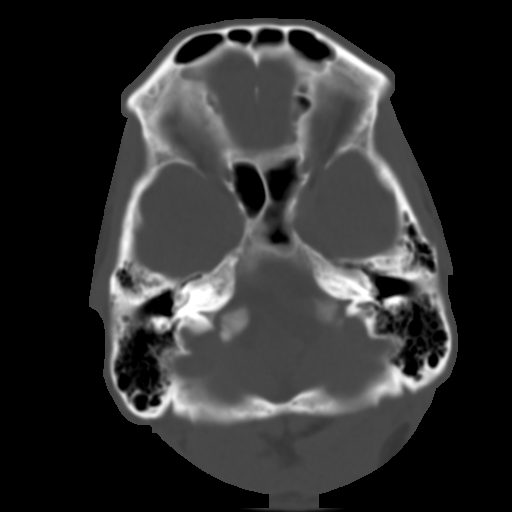
[im 8/30  brain]
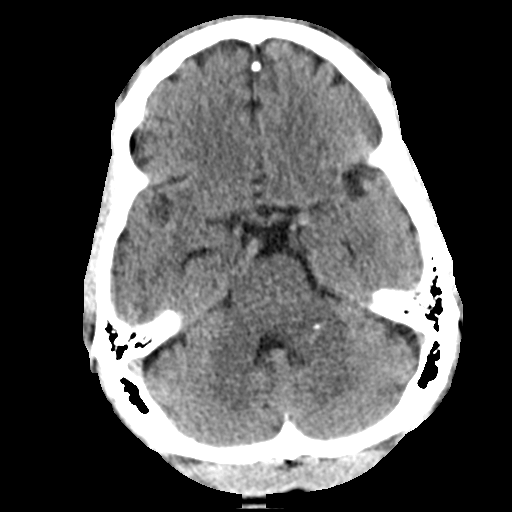
[im 11/30  brain]
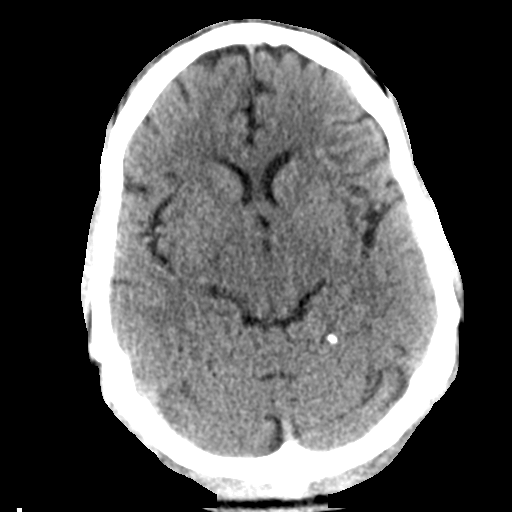
[im 15/30  brain]
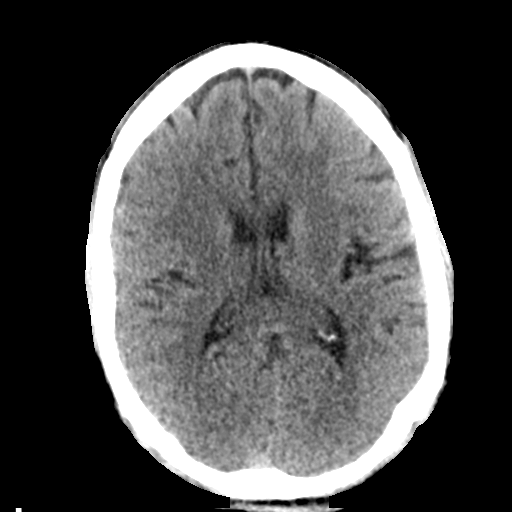
[im 19/30  brain]
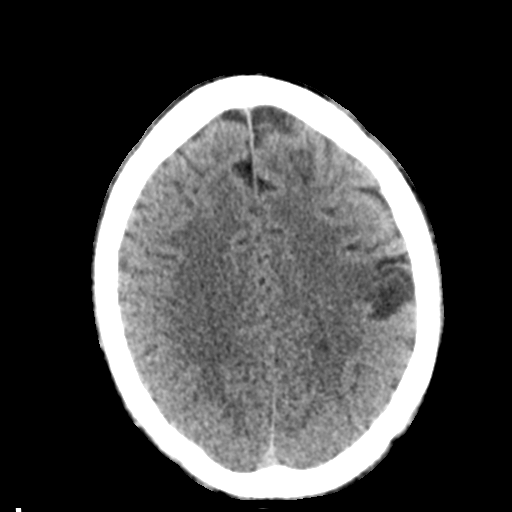
[im 19/30  bone]
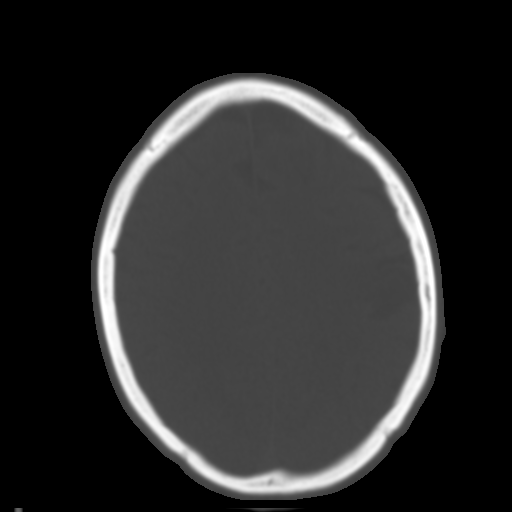
[im 22/30  brain]
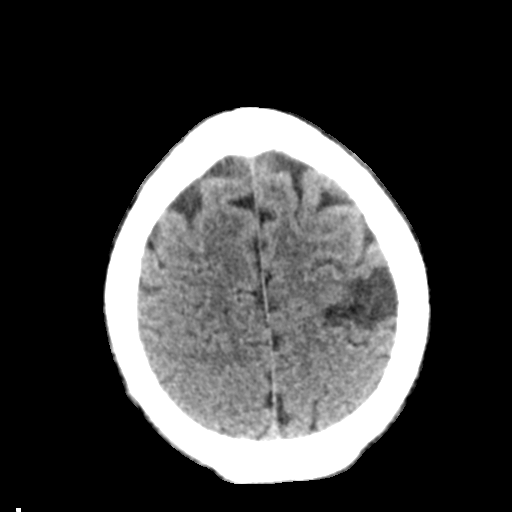
[im 26/30  brain]
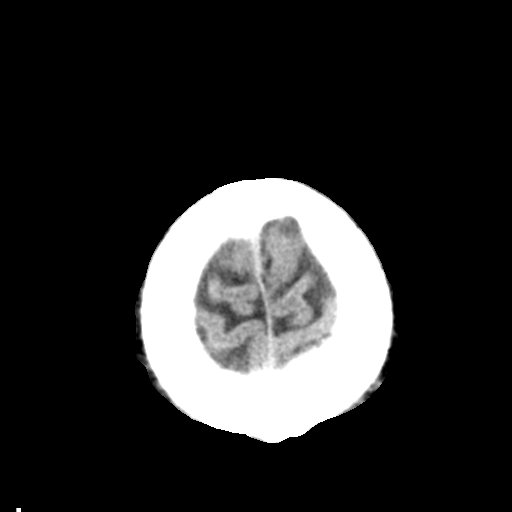

[Series 4: head bone · axial · 0.39mm/px · z∈[-113,-83]mm · 3 of 75 slices shown]
[im 8/75  bone]
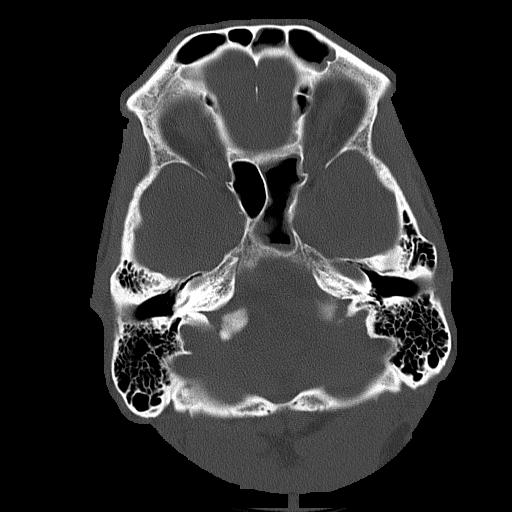
[im 15/75  bone]
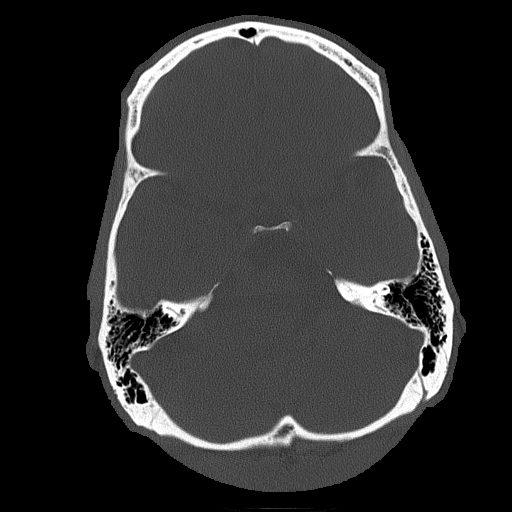
[im 23/75  bone]
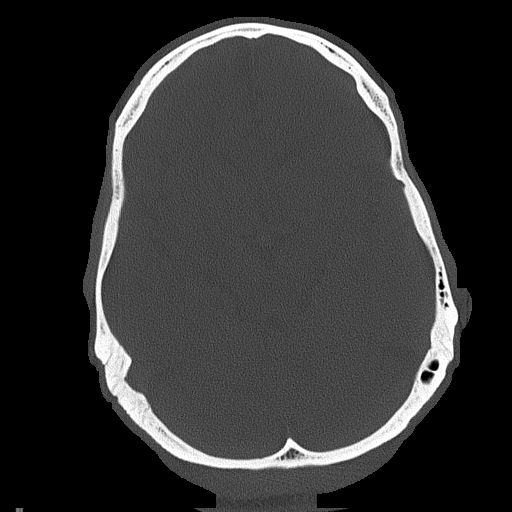

[Series 5: head without cor · coronal · non-contrast · 0.30mm/px · 3 of 69 slices shown]
[im 23/69  brain]
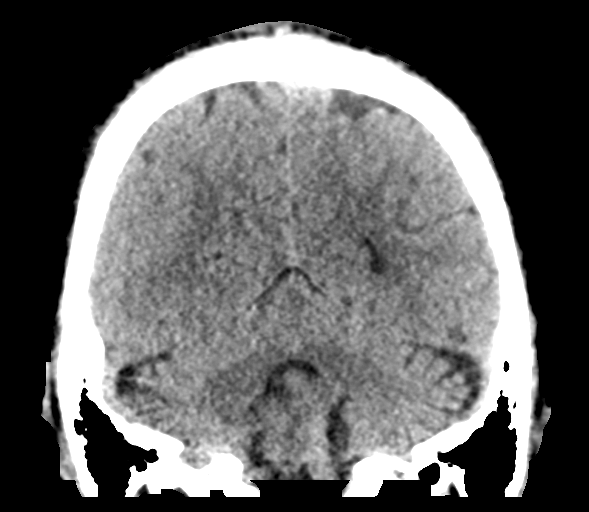
[im 31/69  brain]
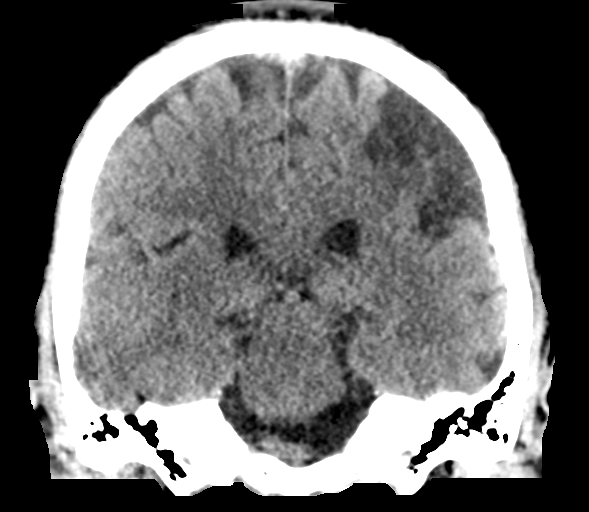
[im 38/69  brain]
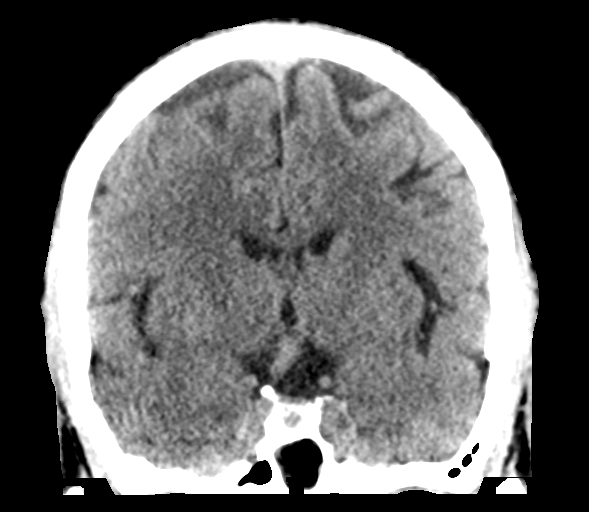

[Series 6: head without sag · sagittal · non-contrast · 0.30mm/px · 3 of 60 slices shown]
[im 20/60  brain]
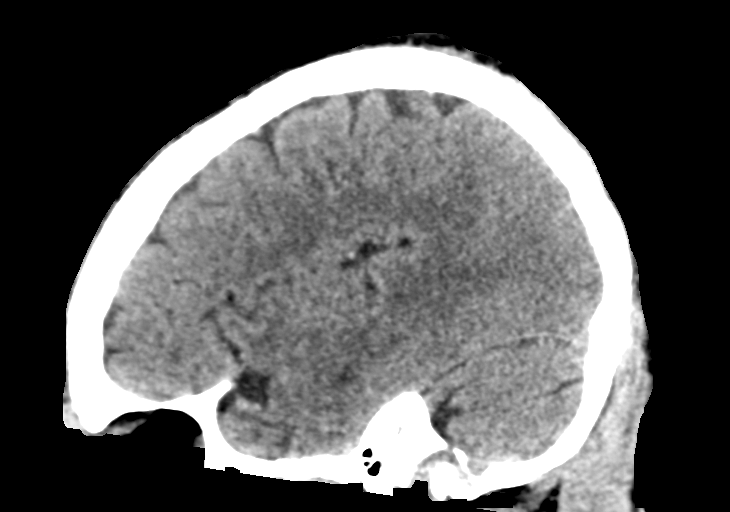
[im 30/60  brain]
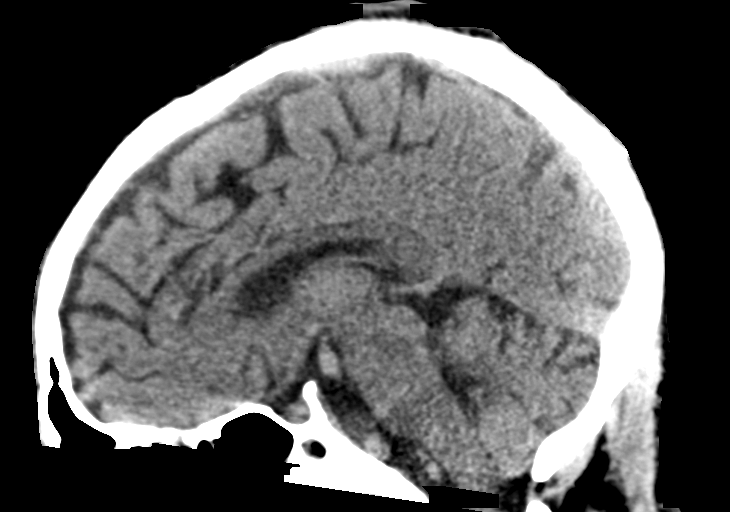
[im 40/60  brain]
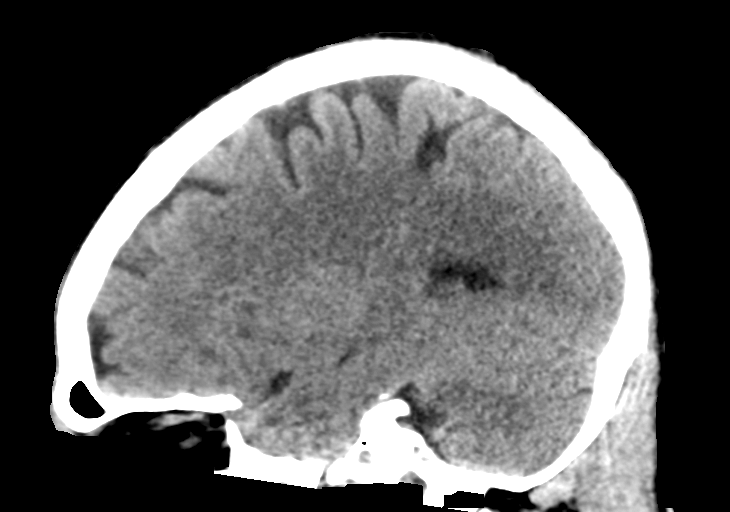

[16 of 47 positions shown; findings below may reference images not displayed]

FINDINGS: Brain: There is a chronic moderate-sized left MCA infarct in the
parietal lobe, acute on the prior MRI. There is no evidence of acute
infarct, intracranial hemorrhage, mass, midline shift, or
extra-axial fluid collection. Patchy cerebral white matter
hypodensities are most conspicuous in the subcortical frontal white
matter, nonspecific but compatible with chronic small vessel
ischemic disease. The ventricles are normal.

Vascular: No hyperdense vessel. Vertebrobasilar dolichoectasia.

Skull: No fracture or focal osseous lesion.

Sinuses/Orbits: Moderate bilateral ethmoid, left frontal, and left
sphenoid sinus mucosal thickening. Clear mastoid air cells.
Unremarkable included orbits.

Other: None.
IMPRESSION: 1. No evidence of acute intracranial abnormality.
2. Chronic left MCA infarct.

## 2020-07-30 IMAGING — CT CT ANGIO CHEST-ABD-PELV FOR DISSECTION W/ AND WO/W CM
2 of 7 series · 12 of 46 positions shown, 14 images · IV contrast (iopamidol)
Comparison: 10/01/2017 as well as multiple prior studies.

CLINICAL DATA: Chest pain radiating into left chest with nausea and
headache. Prior Bentall procedure with arch replacement and
debranching followed by TEVAR to treat chronic type B aortic
dissection. Status post more recent thoracoabdominal aortic repair
and placement of multi branch graft to the celiac, SMA and bilateral
renal arteries in December 2016. All of the vascular procedures were
performed at [REDACTED].

EXAM:
CT ANGIOGRAPHY CHEST, ABDOMEN AND PELVIS
TECHNIQUE: Multidetector CT imaging through the chest, abdomen and pelvis was
performed using the standard protocol during bolus administration of
intravenous contrast. Multiplanar reconstructed images and MIPs were
obtained and reviewed to evaluate the vascular anatomy.
CONTRAST:  100mL [id number] IOPAMIDOL (Z97FNT-B3L) INJECTION 76%

[Series 8: dissection 2mm · axial · 0.97mm/px · z∈[-879,-267]mm · 9 of 346 slices shown, 11 images]
[im 20/346  soft-tissue]
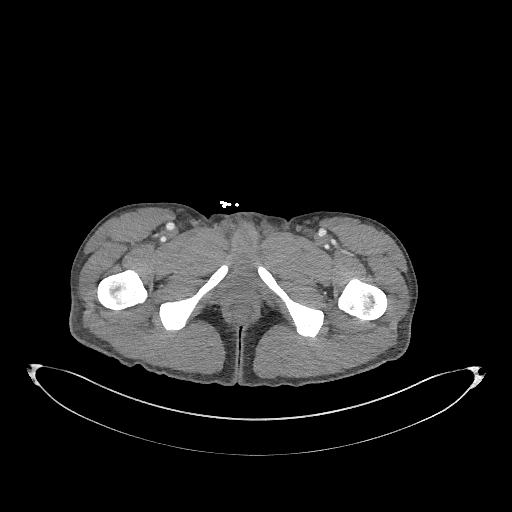
[im 20/346  bone]
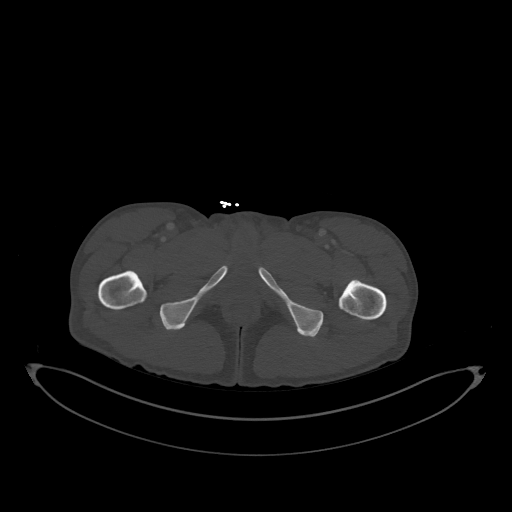
[im 58/346  soft-tissue]
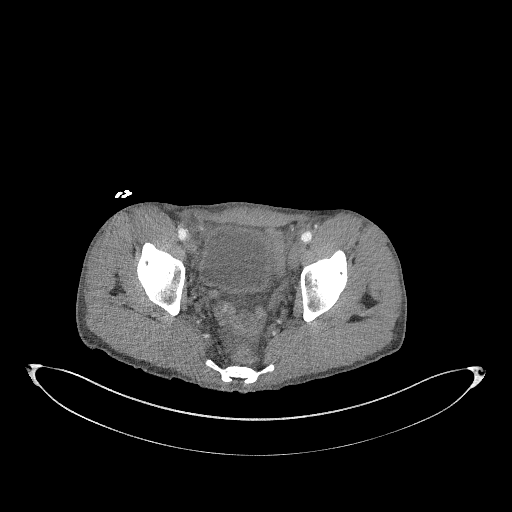
[im 96/346  soft-tissue]
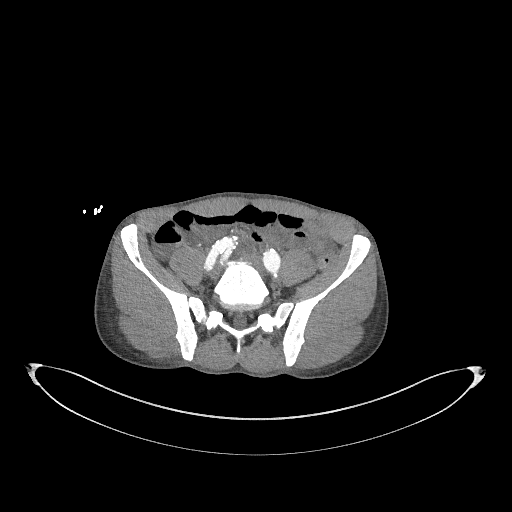
[im 135/346  soft-tissue]
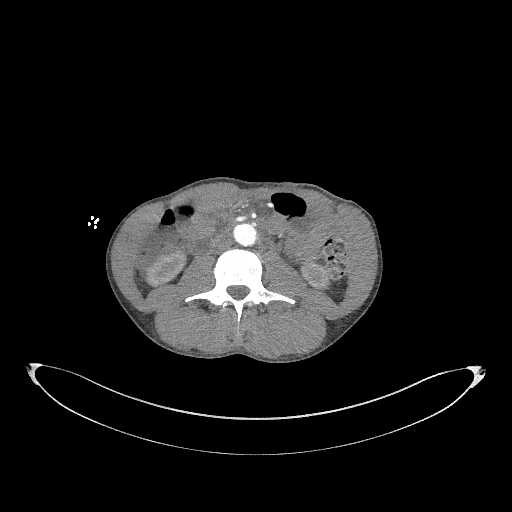
[im 173/346  soft-tissue]
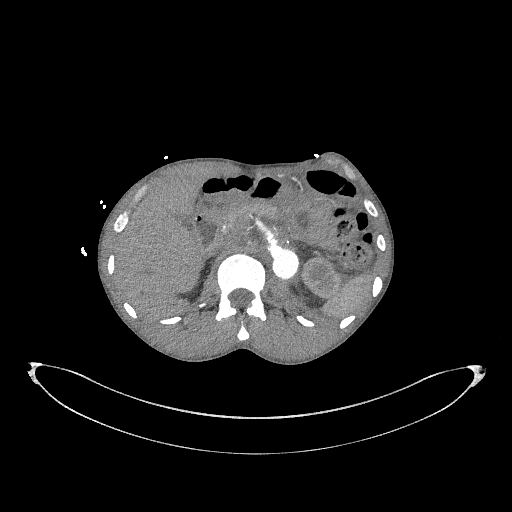
[im 211/346  soft-tissue]
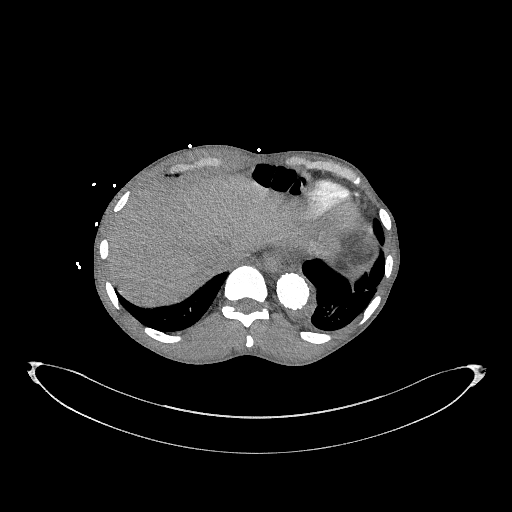
[im 250/346  soft-tissue]
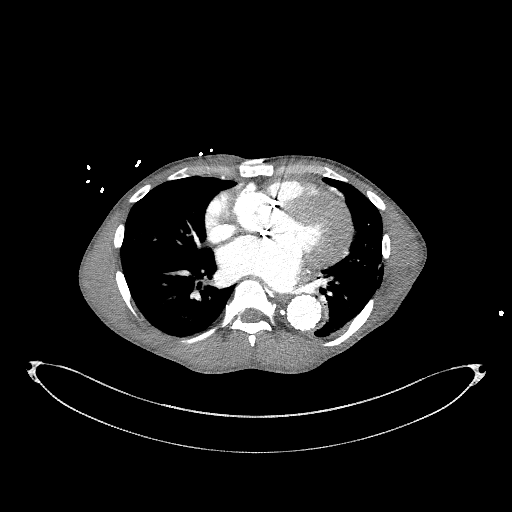
[im 288/346  soft-tissue]
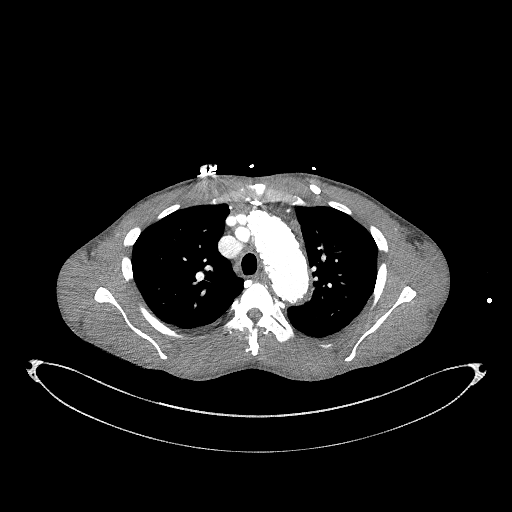
[im 326/346  soft-tissue]
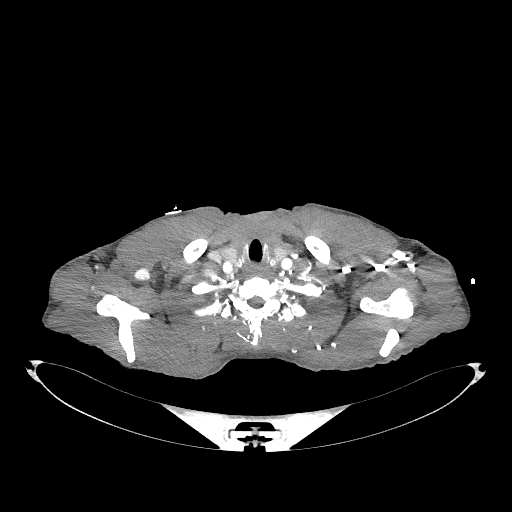
[im 326/346  bone]
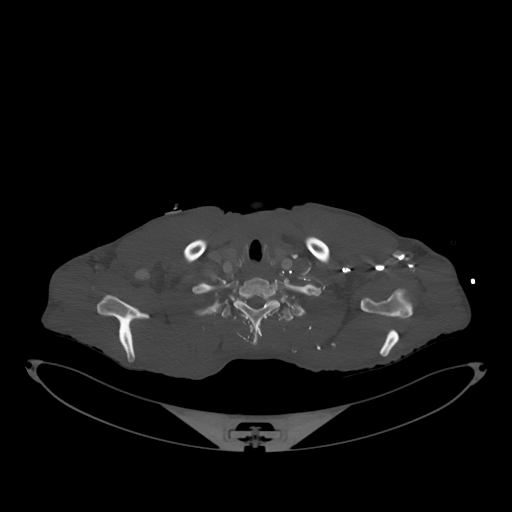

[Series 11: dissection 2mm cor · coronal · 0.69mm/px · 3 of 148 slices shown]
[im 37/148  soft-tissue]
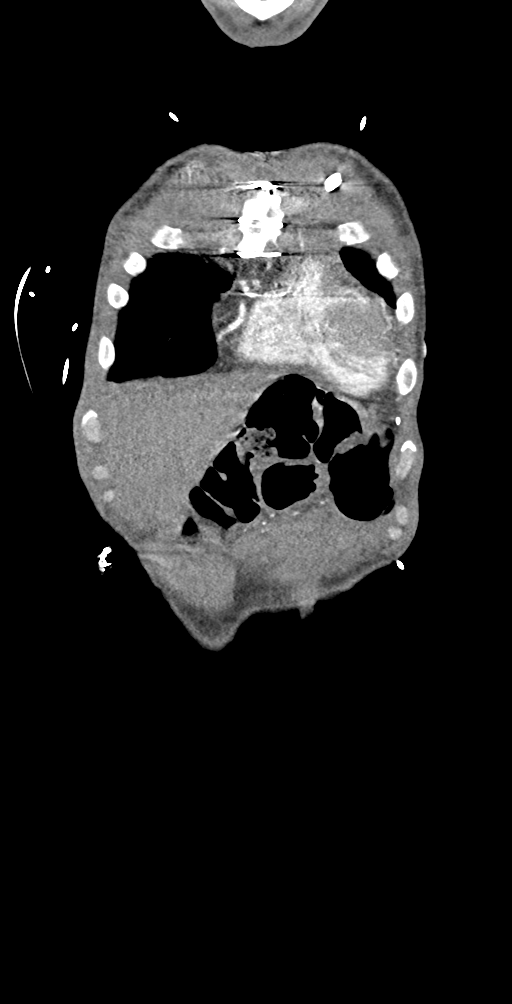
[im 74/148  soft-tissue]
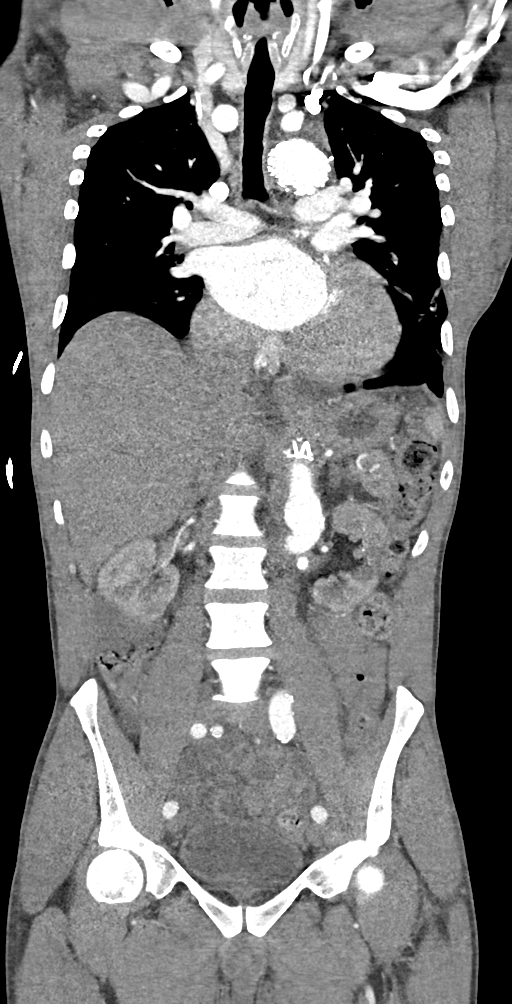
[im 111/148  soft-tissue]
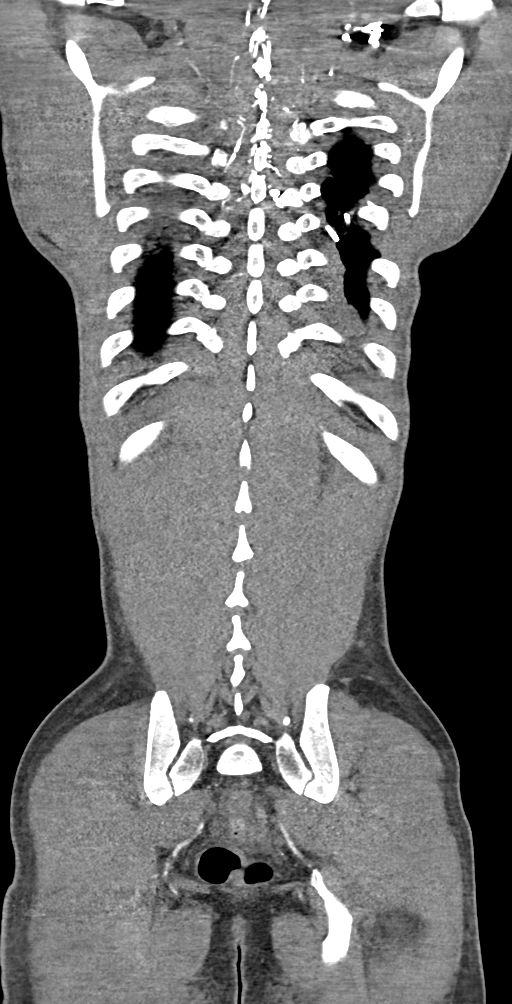

[12 of 46 positions shown; findings below may reference images not displayed]

FINDINGS: CTA CHEST FINDINGS

Cardiovascular: Prosthetic aortic valve shows stable appearance. The
aortic root demonstrates stable diameter of approximately 4.3 cm.
Ascending aortic graft and thoracic endograft shows stable
appearance and patency. On the arterial phase of imaging, no
endoleak or dissection is identified. No evidence of intramural
aortic hemorrhage. Elephant graft supplying the great vessels shows
normal and stable patency with normal patency noted of the
visualized proximal great vessels.

Stable heart size with evidence of stable left ventricular
hypertrophy. No pericardial fluid identified. The pulmonary arteries
are not adequately opacified to assess for pulmonary embolism.
Central pulmonary arteries are normal in caliber.

Mediastinum/Nodes: No enlarged mediastinal, hilar, or axillary lymph
nodes. Thyroid gland, trachea, and esophagus demonstrate no
significant findings.

Lungs/Pleura: Lungs show stable mild scarring at the left base.
There is no evidence of pulmonary edema, consolidation,
pneumothorax, nodule or pleural fluid.

Musculoskeletal: No chest wall abnormality. No acute or significant
osseous findings.

Review of the MIP images confirms the above findings.

CTA ABDOMEN AND PELVIS FINDINGS

VASCULAR

Aorta: Stable appearance of the proximal abdominal aorta status post
graft placement with stable grafts to the celiac axis, superior
mesenteric artery and bilateral renal arteries. All of the visceral
arteries are normally patent. The aneurysmal portion of the native
aorta previously representing a patent false lumen after aortic
dissection remains successfully excluded and thrombosed. Aortic
diameter at this level is approximately 4.6 cm including the
thrombosed excluded aorta. The distal aorta is normally patent. No
evidence of acute dissection.

Celiac: Normally patent.

SMA: Normally patent.

Renals: Normally patent.

IMA: Normally patent.

Inflow: Stable aneurysmal disease of the common iliac arteries with
maximal caliber of 2 cm on the left and 1.6 cm on the right.
Internal and external iliac arteries show stable and normal patency.
The common femoral arteries and femoral bifurcations are normally
patent.

Review of the MIP images confirms the above findings.

NON-VASCULAR

Hepatobiliary: No focal liver abnormality is seen. No gallstones,
gallbladder wall thickening, or biliary dilatation.

Pancreas: Unremarkable. No pancreatic ductal dilatation or
surrounding inflammatory changes.

Spleen: Normal in size without focal abnormality.

Adrenals/Urinary Tract: Adrenal glands are unremarkable. Kidneys are
normal, without renal calculi, focal lesion, or hydronephrosis.
Bladder is unremarkable.

Stomach/Bowel: No bowel abnormalities identified on the arterial
phase study without oral contrast. No free intraperitoneal air
identified.

Lymphatic: No enlarged lymph nodes identified.

Reproductive: Prostate is unremarkable.

Other: No abdominal wall hernia or abnormality. No abdominopelvic
ascites.

Musculoskeletal: No acute or significant osseous findings.

Review of the MIP images confirms the above findings.
IMPRESSION: Stable postoperative appearance of the thoracic and abdominal aorta
after prior extensive procedures including aortic valve replacement,
aortic root replacement, debranching of the great vessels with
elephant graft, TEVAR and thoracoabdominal aortic repair with
grafting to the celiac axis, SMA and bilateral renal arteries. No
evidence of acute dissection, hemorrhage or other acute findings
demonstrated.

## 2020-07-30 IMAGING — DX DG CHEST 1V PORT
1 series · 1 of 1 positions shown · non-contrast
Comparison: Chest radiograph September 30, 2017

CLINICAL DATA: Constant chest pressure for 2 days. Shortness of
breath.

EXAM:
PORTABLE CHEST 1 VIEW

[chest]
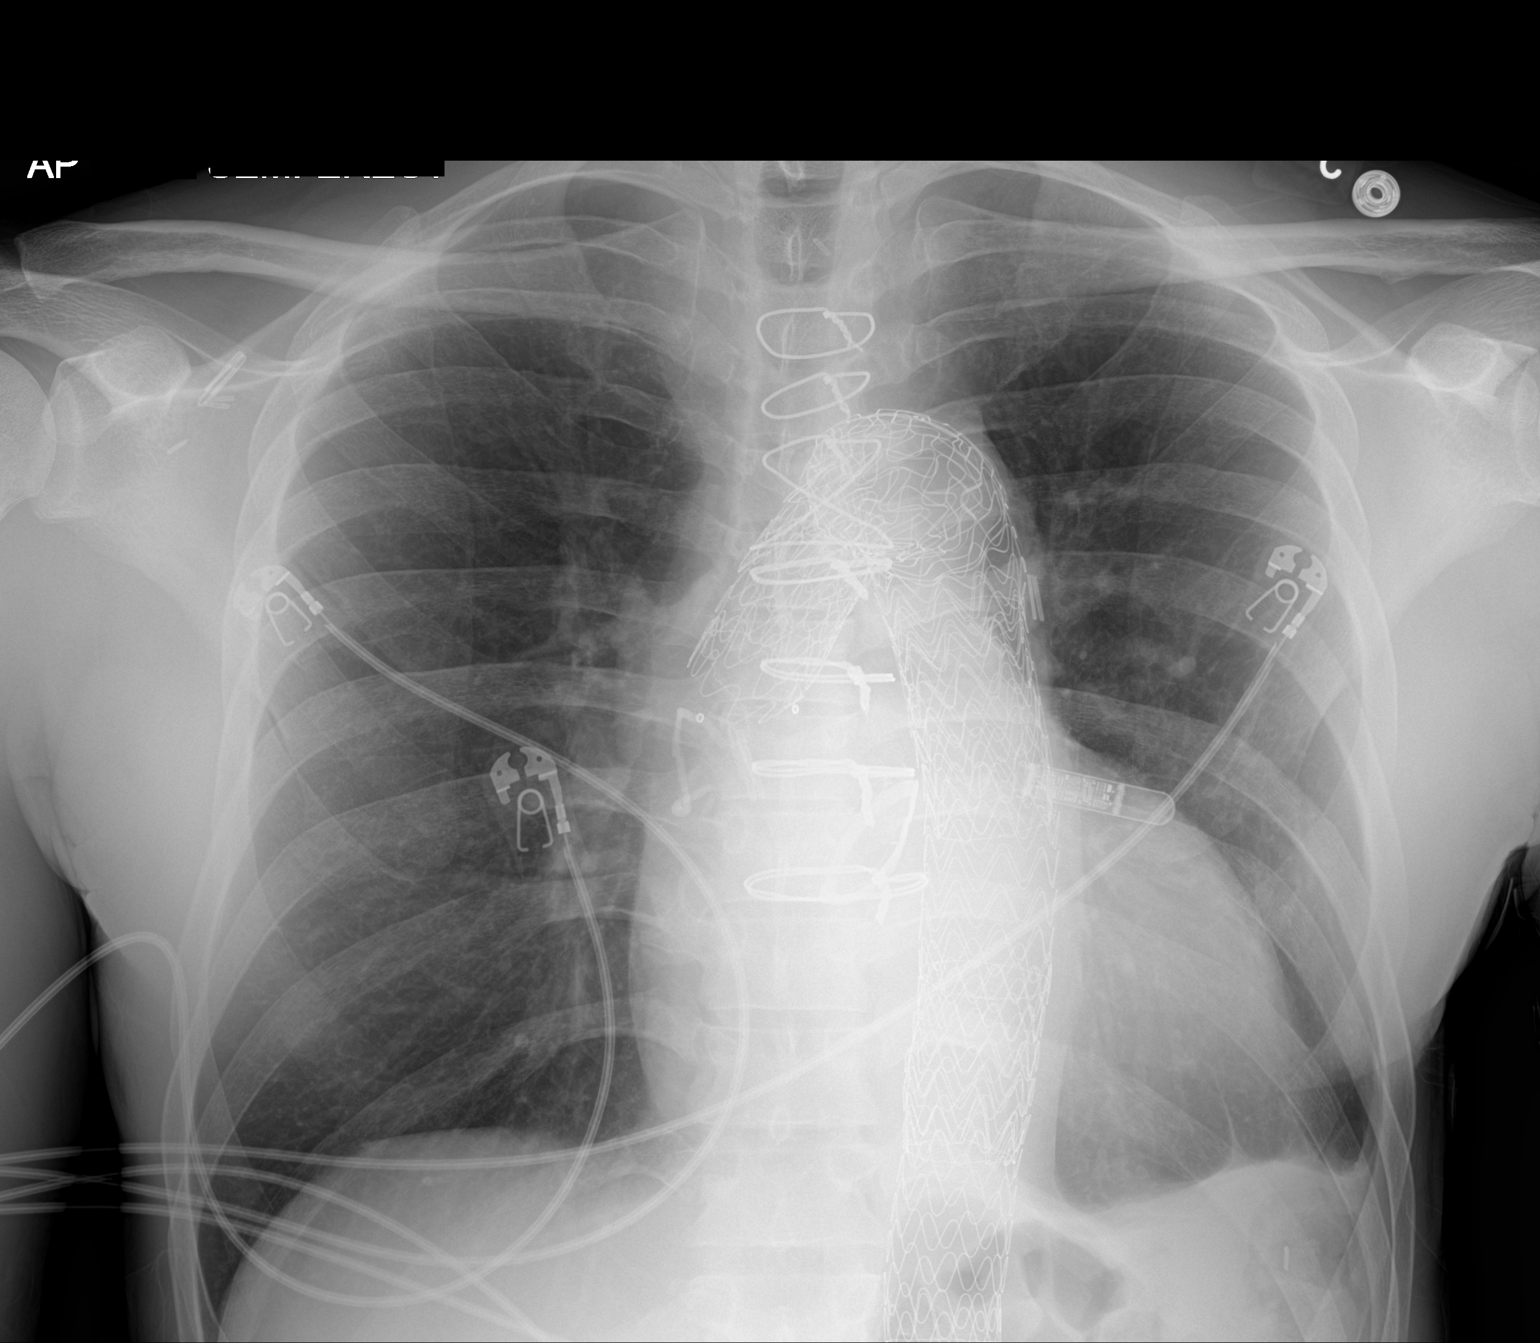

[1 of 1 positions shown; findings below may reference images not displayed]

FINDINGS: Stable cardiomegaly. Aortic stent graft. Status post median
sternotomy, cardiac valve replacement. Loop monitor projects LEFT
chest. No pleural effusion or focal consolidation. LEFT lung base
scarring with pleural thickening. Surgical clips project RIGHT
axilla. Osseous structures are normal.
IMPRESSION: Stable cardiomegaly. No acute pulmonary process. Stable LEFT lung
base pleural thickening and scarring.

## 2020-08-19 ENCOUNTER — Encounter (HOSPITAL_COMMUNITY): Payer: Medicare HMO

## 2020-08-30 ENCOUNTER — Other Ambulatory Visit: Payer: Self-pay

## 2020-08-30 ENCOUNTER — Encounter (HOSPITAL_COMMUNITY): Payer: Self-pay

## 2020-08-30 ENCOUNTER — Ambulatory Visit (HOSPITAL_COMMUNITY)
Admission: RE | Admit: 2020-08-30 | Discharge: 2020-08-30 | Disposition: A | Discharge status: Home / Self Care | Payer: Medicare HMO | Source: Ambulatory Visit | Attending: Family Medicine | Admitting: Family Medicine

## 2020-08-30 VITALS — BP 158/80 | HR 85 | Wt 152.8 lb

## 2020-08-30 DIAGNOSIS — Z7982 Long term (current) use of aspirin: Secondary | ICD-10-CM | POA: Insufficient documentation

## 2020-08-30 DIAGNOSIS — I10 Essential (primary) hypertension: Secondary | ICD-10-CM | POA: Diagnosis not present

## 2020-08-30 DIAGNOSIS — K219 Gastro-esophageal reflux disease without esophagitis: Secondary | ICD-10-CM | POA: Diagnosis not present

## 2020-08-30 DIAGNOSIS — I252 Old myocardial infarction: Secondary | ICD-10-CM | POA: Insufficient documentation

## 2020-08-30 DIAGNOSIS — Z955 Presence of coronary angioplasty implant and graft: Secondary | ICD-10-CM | POA: Insufficient documentation

## 2020-08-30 DIAGNOSIS — Z8249 Family history of ischemic heart disease and other diseases of the circulatory system: Secondary | ICD-10-CM | POA: Diagnosis not present

## 2020-08-30 DIAGNOSIS — N184 Chronic kidney disease, stage 4 (severe): Secondary | ICD-10-CM | POA: Insufficient documentation

## 2020-08-30 DIAGNOSIS — E785 Hyperlipidemia, unspecified: Secondary | ICD-10-CM | POA: Diagnosis not present

## 2020-08-30 DIAGNOSIS — Z953 Presence of xenogenic heart valve: Secondary | ICD-10-CM | POA: Diagnosis not present

## 2020-08-30 DIAGNOSIS — I5022 Chronic systolic (congestive) heart failure: Secondary | ICD-10-CM

## 2020-08-30 DIAGNOSIS — I429 Cardiomyopathy, unspecified: Secondary | ICD-10-CM | POA: Insufficient documentation

## 2020-08-30 DIAGNOSIS — F17210 Nicotine dependence, cigarettes, uncomplicated: Secondary | ICD-10-CM | POA: Insufficient documentation

## 2020-08-30 DIAGNOSIS — I359 Nonrheumatic aortic valve disorder, unspecified: Secondary | ICD-10-CM | POA: Diagnosis not present

## 2020-08-30 DIAGNOSIS — I2510 Atherosclerotic heart disease of native coronary artery without angina pectoris: Secondary | ICD-10-CM | POA: Insufficient documentation

## 2020-08-30 DIAGNOSIS — Z79899 Other long term (current) drug therapy: Secondary | ICD-10-CM | POA: Diagnosis not present

## 2020-08-30 DIAGNOSIS — Z9889 Other specified postprocedural states: Secondary | ICD-10-CM

## 2020-08-30 DIAGNOSIS — Z8673 Personal history of transient ischemic attack (TIA), and cerebral infarction without residual deficits: Secondary | ICD-10-CM | POA: Diagnosis not present

## 2020-08-30 DIAGNOSIS — Z720 Tobacco use: Secondary | ICD-10-CM

## 2020-08-30 DIAGNOSIS — I130 Hypertensive heart and chronic kidney disease with heart failure and stage 1 through stage 4 chronic kidney disease, or unspecified chronic kidney disease: Secondary | ICD-10-CM | POA: Diagnosis not present

## 2020-08-30 DIAGNOSIS — I5042 Chronic combined systolic (congestive) and diastolic (congestive) heart failure: Secondary | ICD-10-CM | POA: Insufficient documentation

## 2020-08-30 DIAGNOSIS — F1290 Cannabis use, unspecified, uncomplicated: Secondary | ICD-10-CM | POA: Diagnosis not present

## 2020-08-30 LAB — BASIC METABOLIC PANEL
Anion gap: 8 (ref 5–15)
BUN: 52 mg/dL — ABNORMAL HIGH (ref 6–20)
CO2: 21 mmol/L — ABNORMAL LOW (ref 22–32)
Calcium: 8.9 mg/dL (ref 8.9–10.3)
Chloride: 110 mmol/L (ref 98–111)
Creatinine, Ser: 6.53 mg/dL — ABNORMAL HIGH (ref 0.61–1.24)
GFR, Estimated: 10 mL/min — ABNORMAL LOW (ref >60)
Glucose, Bld: 94 mg/dL (ref 70–99)
Potassium: 4.6 mmol/L (ref 3.5–5.1)
Sodium: 139 mmol/L (ref 135–145)

## 2020-08-30 MED ORDER — HYDRALAZINE HCL 50 MG PO TABS
75.0000 mg | ORAL_TABLET | Freq: Three times a day (TID) | ORAL | 11 refills | Status: DC
Start: 2020-08-30 — End: 2020-09-11

## 2020-08-30 NOTE — Patient Instructions (Addendum)
STOP Jardiance INCREASE Hydralazine to 75 mg (one and one half tab) three times daily  Your physician recommends that you schedule a follow-up appointment in: 4 weeks  in the Advanced Practitioners (PA/NP) Clinic    Do the following things EVERYDAY: Weigh yourself in the morning before breakfast. Write it down and keep it in a log. Take your medicines as prescribed Eat low salt foods--Limit salt (sodium) to 2000 mg per day.  Stay as active as you can everyday Limit all fluids for the day to less than 2 liters  At the [facility], you and your health needs are our priority. As part of our continuing mission to provide you with exceptional heart care, we have created designated Provider Care Teams. These Care Teams include your primary Cardiologist (physician) and Advanced Practice Providers (APPs- Physician Assistants and Nurse Practitioners) who all work together to provide you with the care you need, when you need it.   You may see any of the following providers on your designated Care Team at your next follow up: Dr Glori Bickers Dr [name] Dr Patrice Paradise, NP Lyda Jester, [place] Ginnie Smart Audry Riles, PharmD   Please be sure to bring in all your medications bottles to every appointment.   If you have any questions or concerns before your next appointment please send [name] a message through [place] or call our office at [phone].    TO LEAVE A MESSAGE FOR THE NURSE SELECT OPTION 2, PLEASE LEAVE A MESSAGE INCLUDING: YOUR NAME DATE OF BIRTH CALL BACK NUMBER REASON FOR CALL**this is important as we prioritize the call backs  YOU WILL RECEIVE A CALL BACK THE SAME DAY AS LONG AS YOU CALL BEFORE 4:00 PM

## 2020-08-30 NOTE — Progress Notes (Signed)
Date:  08/30/2020   ID:  [name], DOB 1970/05/29, MRN [id number]   Provider location: [place] [facility] Type of Visit: Established patient   PCP:  Kerin Perna, NP  Cardiologist:  Dr. Aundra Dubin   History of Present Illness: [name] is a 50 y.o. 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  The descending thoracic aorta has dilated to 5.1 cm.  Cardiac catheterization in April 2016 demonstrated 60% first diagonal lesion.  He underwent aortic valve replacement with bovine pericardial tissue valve and reconstruction along with aortic root replacement and endovascular repair of the descending thoracic aortic aneurysm at [facility].  He has had intermittent chest discomfort since then.   Admitted in 12/02/2017 w/ chest pain. Had NSTEMI. He had LHC with stent placed proximal ramus.  Repeat cath in 7/20 with nonobstructive disease.   He was admitted in 9/21 with atypical chest pain.  Echo showed EF 60-65% with severe LVH, normal RV, bioprosthetic aortic valve with mean gradient 18 mmHg.  Cardiolite showed no ischemia.   Last visit w/ Dr. Aundra Dubin 2/22, he continued to complain of chronic non-exertional CP. Symptoms c/w GERD, however not relived w/ PPI. He was referred to GI for further evaluation. CXR was also obtained and unremarkable. His BP had been elevated and meds adjusted. Hydralazine 25 mg tid was added and he was ordered to get renal artery dopplers to assess for RAS. This showed moderate RAS on the rt w/ 1-59% stenosis. The left RA was normal. He was scheduled for return f/u w/  PharmD 3/22 for reassessment of BP and further med titration, if needed, but he no showed.   He ended up going to the ED on 3/4 for CP. EKG showed no changes. Found to have elevated HS trop of 136 (prior baseline levels 60-80). This was also in the setting of worsening SCr, up to 3.92. General cardiology was consulted. Echo was completed and showed preserved LVEF severe LVH, increased mean gradient of bioprosthetic AVR, 31 mmHg (see full report below). It was noted that he had diffuse tenderness/pain on exam of chest, and there are palpable ribs that are displaced/pop when palpated. He was given trial of topical lidoderm patch, which helped some.  His elevated troponins and pain were not consistent with NSTEMI, suspect that this is due to stress from pain and worsening CKD. He was discharged home w/ Lidoderm patch for pain.   Today he returns for HF follow up. Had nephro appt today, discussions had about need or HD. Overall feeling tired. SOB with stairs, general fatigue consistently. Denies increasing CP, dizziness, edema, or PND/Orthopnea. Appetite poor. No fever or chills. Weight at home 152 pounds. he reports normal urine output. Taking all medications. BP at home ~170  Echo 05/2020 1. Left ventricular ejection fraction, by estimation, is 60 to 65%. The left ventricle has normal function. The left ventricle has no regional wall motion abnormalities. There is severe left ventricular hypertrophy. Left ventricular diastolic parameters are consistent with Grade II diastolic dysfunction (pseudonormalization). Elevated left ventricular end-diastolic pressure. The  average left ventricular global longitudinal strain is -14.2 %. The global longitudinal strain is abnormal. 2. Right ventricular systolic function is normal. The right ventricular size is normal. Tricuspid regurgitation signal is inadequate for assessing PA pressure. 3. Left atrial size was severely dilated. 4. Right atrial size was moderately  dilated. 5. A small pericardial effusion is present. There is no evidence of cardiac tamponade. 6. The mitral valve is normal in structure. Trivial mitral valve regurgitation. No evidence of mitral stenosis. 7. The aortic valve has been repaired/replaced. Aortic valve regurgitation is not visualized. Moderate aortic valve stenosis. There is a bioprosthetic valve present in the aortic position. Procedure Date: 2016. Aortic valve mean gradient measures 31.7 mmHg. 8. Aortic root/ascending aorta has been repaired/replaced. 9. The inferior vena cava is normal in size with greater than 50% respiratory variability, suggesting right atrial pressure of 3 mmHg.  ECG (personally reviewed): none ordered this encounter   Labs (10/19): K 4.1, creatinine 1.78, myeloma panel negative Labs (2/20): K 4.2, creatinine 1.75, LDL 128 Labs (7/21): LDL 66 Labs (9/21): K 4.1, creatinine 3.31, hgb 11.9 Labs (10/21): K 4, creatinine 2.96, hgb 12.8 Labs (2/22): SCr 3.90, 4.0, hgb 12.7 Labs (3/22): SCr 3.92, 3.7, hgb 12.0 Labs (3/22): Scr 4.01, 3.8, hgb 11.5  Labs (5/22): K 4.6, creatinine 4.8  Review of Systems: All systems reviewed and negative except as per HPI.    PMH: 1. H/o ankle fractures bilaterally 2. GERD 3. HTN: Negative urinary catecholeamine collection for pheochromocytoma.  4. CKD: Stage 3. Suspect hypertensive nephropathy.  5. Cardiomyopathy: Most likely due to heavy ETOH ingestion and uncontrolled HTN.   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  Lexiscan myoview (4/13): EF 37%, global hypokinesis, no ischemic or infarction.  Echo (12/14) with EF 45-50%, diffuse hypokinesis, moderate LVH, mild MR, moderate AI.  Echo (4/16) with EF 55-60%, severe LVH, mild LV dilation, moderate AI and moderate MR.   - Echo (9/19): EF 35-40% with diffuse hypokinesis, severe LVH, bioprosthetic  aortic valve functioning normally.  - PYP scan (1/20): No evidence for TTR amyloidosis.  - Echo (9/21): EF 60-65%, severe LVH, normal RV, bioprosthetic aortic valve with mean gradient 18 mmHg.  - Echo (3/22): EF 60-65%, Grade II DD 6. ETOH abuse: Quit 3/13.  7. Active smoker. 8. Valvular heart disease: Echo in 3/13 showed moderate to severe eccentric MR and mild to moderate AI with prolapsing left coronary cusp. Echo 12/14 with mild MR and moderate AI.  Echo 4/16 with moderate AI and moderate MR.  - S/p Bentall procedure with bioprosthetic aortic valve 9/16.  9.  CVA: Has ILR. 10. Type B aortic dissection: Occurred in 2/16, from just distal to the left subclavian.  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    - 10/03/2014 First stage median sternotomy with right axillary cannulation for aortic valved conduit root replacement with coronary reconstruction (button Bentall Procedure), 27 mm Sorin Mitroflow bovine pericardial valved conduit, total arch replacement (99m Vascutek BSomaliamultibranch arch graft with individual head vessel reimplantation. - Status post  open repair of an Extent III TAA secondary to chronic dissection with a # 250mVascutek "Coselli" multibranch Dacron graft with individual celiac, SMA, and bilateral renal artery implantation on 01/04/2017, [facility].  11. CAD: LHC (4/16) with 60% ostial D1.   - NSTEMI 9/19: LHC (9/19) showed 50% proximal LCx, 90% ramus =>  DES to ramus.  - LHC (7/20): D1 60% stenosis, patent ramus stent.  - Cardiolite (9/21): Inferior fixed defect, no ischemia.  12. Sleep study (10/21) with no significant OSA.   Current Outpatient Medications  Medication Sig Dispense Refill   acetaminophen (TYLENOL) 325 MG tablet Take 650 mg by mouth every 6 (six) hours as needed for moderate pain (for pain or headaches).     albuterol (VENTOLIN HFA) 108 (90 Base) MCG/ACT inhaler Inhale 2 puffs into the lungs every 6 (six) hours as needed for wheezing or  shortness of breath.     amLODipine (NORVASC) 10 MG tablet Take 1 tablet (10 mg total) by mouth daily. 30 tablet 1   aspirin EC 81 MG EC tablet Take 1 tablet (81 mg total) by mouth daily. 90 tablet 3   atorvastatin (LIPITOR) 80 MG tablet Take 1 tablet (80 mg total) by mouth daily. 30 tablet 2   Blood Pressure Monitor KIT 1 Device by Does not apply route 2 (two) times daily. 1 kit 0   carvedilol (COREG) 25 MG tablet Take 25 mg by mouth 2 (two) times daily with a meal.     cloNIDine (CATAPRES - DOSED IN MG/24 HR) 0.3 mg/24hr patch Place 1 patch (0.3 mg total) onto the skin once a week. 4 patch 6   doxazosin (CARDURA) 8 MG tablet TAKE 2 TABLETS(16 MG) BY MOUTH AT BEDTIME 180 tablet 1   empagliflozin (JARDIANCE) 10 MG TABS tablet Take 1 tablet (10 mg total) by mouth daily. 30 tablet 11   Evolocumab (REPATHA SURECLICK) 948 MG/ML SOAJ Inject 1 Dose into the skin every 14 (fourteen) days. 2 mL 11   hydrALAZINE (APRESOLINE) 50 MG tablet Take 1 tablet (50 mg total) by mouth 3 (three) times daily. 270 tablet 3   lidocaine (LIDODERM) 5 % PLACE 1 PATCH ONTO THE SKIN EVERY 12 HOURS REMOVE AND DISCARD PATCH WITHIN 12 HOURS OR AS DIRECTED BY MD 180 patch 3   MINOXIDIL PO Take 20 mg by mouth daily.     pantoprazole (PROTONIX) 40 MG tablet Take 1 tablet (40 mg total) by mouth daily. 30 tablet 6   STIOLTO RESPIMAT 2.5-2.5 MCG/ACT AERS INHALE 2 PUFFS INTO THE LUNGS DAILY 4 g 5   No current facility-administered medications for this encounter.    Allergies:   Imdur [isosorbide nitrate] and Tramadol   Social History:  The patient  reports that he has been smoking cigarettes. He has a 5.75 pack-year smoking history. He has never used smokeless tobacco. He reports current alcohol use of about 1.0 standard drink of alcohol per week. He reports current drug use. Drug: Marijuana.   Family History:  The patient's family history includes Colon cancer in his paternal uncle; Diabetes in his maternal aunt; Headache in his  sister; Heart attack in his brother; Hypertension in his brother, mother, and another family member; Lung cancer in his maternal uncle; Other in his sister; Stroke in his maternal aunt and maternal uncle; Thyroid disease in his sister.   ROS:  Please see the history of present illness.   All other systems are personally reviewed and negative.   Exam:   BP (!) 158/80   Pulse 85   Wt 69.3 kg (152 lb 12.8 oz)   SpO2 98%   BMI 20.72 kg/m  PHYSICAL EXAM: General:  Well appearing. No respiratory difficulty HEENT: normal Neck: supple. no JVD. Carotids 2+ bilat; no bruits. No lymphadenopathy or thyromegaly appreciated. Cor: PMI nondisplaced. Regular rate & rhythm.  2/6 murmur  Lungs: clear no wheezing  Abdomen: soft, nontender, nondistended. No hepatosplenomegaly. No bruits or masses. Good bowel sounds. Extremities: no cyanosis, clubbing, rash, edema Neuro: alert & oriented x 3, cranial nerves grossly intact. moves all 4 extremities w/o difficulty. Affect pleasant.  Recent Labs: 05/17/2020: TSH 2.804 05/18/2020: ALT 11 07/23/2020: B Natriuretic Peptide 334.1; Hemoglobin 12.9; Platelets 130 07/30/2020: BUN 49; Creatinine, Ser 4.80; Potassium 3.9; Sodium 138  Personally reviewed   Wt Readings from Last 3 Encounters:  08/30/20 69.3 kg (152 lb 12.8 oz)  07/23/20 72 kg (158 lb 12.8 oz)  05/17/20 74.4 kg (164 lb)    ASSESSMENT AND PLAN:  1. CAD: 9/19 NSTEMI with PCI to proximal ramus.  LHC in 7/20 with no obstructive disease.  Admission in 9/21 with Cardiolite showing no ischemia. Recent Admit 3/22 for CP, suspected to be musculoskeletal.  He has long-standing primarily non-exertional chest pain.  I suspect that it may be musculoskeletal and related to his prior surgeries. No anginal symptomatology.   - he plans to return to [facility] for f/u  - ? Any benefit from nerve block for chronic pain - Continue ASA 81 + ? blocker. - He is on Repatha via the lipid clinic.  2. 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 Myeloma panel negative and PYP scan negative, suspect due more likely to HTN than hypertrophic cardiomyopathy.  Cannot get cardiac MRI with elevated creatinine.  Echo in 9/21 showed EF up to 60-65%, severe LVH, normal RV. Recent echo 3/22 EF 60-65%, severe LVH, RV normal. NYHA class II symptoms. Euvolemic on exam. - Stop Jardiance with eGFR 14. - Continue Coreg 25 mg bid.  - Keep off lasix for now, may need to add back with stopping [id number]. - He is off spironolactone and Entresto with CKD stage IV.  3. HTN: Still needs better control.  Sleep study in 10/21 was negative for OSA.  - Continue amlodipine 10 mg daily, minoxidil, Coreg, and clonidine.  - Increase hydralazine to 75 mg tid. He does not have other good options for antihypertensives.  - Renal artery dopplers completed 3/22 to rule out renal artery stenosis and showed mild Rt RAS 1-59%. No Lt sided RAS.  4. S/P Bioprothetic AVR: Echo in 3/22 with increase in mean gradient (31 mmHg). Monitor annually or sooner if clinically indicated   - SBE prophylaxis w/ dental work  5. S/P 2016 and 2018 thoracic aortic aneurysm repair: Followed by Dr. [name] at [facility].  - lost to f/u. He plans to call to make an appointment  6. CKD Stage IV: New SCr baseline closer to 4 range, nonoliguric.  BUN/K has been stable  - He is followed by [facility], discussed need for HD. - Stop Jardiance. - BMET today.    7. Active smoker:  Smoking cessation advised, he has been cutting back, down to 5 cigs/day.  Follow back in 3-4 weeks.  Signed, Rafael Bihari, FNP -[facility] 08/30/2020  [facility] [address] Heart and [place] [address] [phone] (office) [phone] (fax)

## 2020-09-02 ENCOUNTER — Encounter (HOSPITAL_COMMUNITY): Payer: Self-pay

## 2020-09-02 ENCOUNTER — Other Ambulatory Visit: Payer: Self-pay

## 2020-09-02 ENCOUNTER — Emergency Department (HOSPITAL_COMMUNITY): Payer: Medicare HMO

## 2020-09-02 ENCOUNTER — Inpatient Hospital Stay (HOSPITAL_COMMUNITY)
Admission: EM | Admit: 2020-09-02 | Discharge: 2020-09-11 | DRG: 252 | Disposition: A | Discharge status: Home Health | Payer: Medicare HMO | Attending: Internal Medicine | Admitting: Internal Medicine

## 2020-09-02 DIAGNOSIS — I132 Hypertensive heart and chronic kidney disease with heart failure and with stage 5 chronic kidney disease, or end stage renal disease: Secondary | ICD-10-CM | POA: Diagnosis not present

## 2020-09-02 DIAGNOSIS — Z20822 Contact with and (suspected) exposure to covid-19: Secondary | ICD-10-CM | POA: Diagnosis present

## 2020-09-02 DIAGNOSIS — M5136 Other intervertebral disc degeneration, lumbar region: Secondary | ICD-10-CM | POA: Diagnosis present

## 2020-09-02 DIAGNOSIS — Z8673 Personal history of transient ischemic attack (TIA), and cerebral infarction without residual deficits: Secondary | ICD-10-CM

## 2020-09-02 DIAGNOSIS — Z885 Allergy status to narcotic agent status: Secondary | ICD-10-CM

## 2020-09-02 DIAGNOSIS — Z9889 Other specified postprocedural states: Secondary | ICD-10-CM

## 2020-09-02 DIAGNOSIS — I252 Old myocardial infarction: Secondary | ICD-10-CM

## 2020-09-02 DIAGNOSIS — I2510 Atherosclerotic heart disease of native coronary artery without angina pectoris: Secondary | ICD-10-CM | POA: Diagnosis present

## 2020-09-02 DIAGNOSIS — E785 Hyperlipidemia, unspecified: Secondary | ICD-10-CM | POA: Diagnosis present

## 2020-09-02 DIAGNOSIS — Z79899 Other long term (current) drug therapy: Secondary | ICD-10-CM

## 2020-09-02 DIAGNOSIS — F17210 Nicotine dependence, cigarettes, uncomplicated: Secondary | ICD-10-CM | POA: Diagnosis present

## 2020-09-02 DIAGNOSIS — Z953 Presence of xenogenic heart valve: Secondary | ICD-10-CM

## 2020-09-02 DIAGNOSIS — N185 Chronic kidney disease, stage 5: Secondary | ICD-10-CM | POA: Diagnosis not present

## 2020-09-02 DIAGNOSIS — D631 Anemia in chronic kidney disease: Secondary | ICD-10-CM | POA: Diagnosis present

## 2020-09-02 DIAGNOSIS — R079 Chest pain, unspecified: Secondary | ICD-10-CM | POA: Diagnosis not present

## 2020-09-02 DIAGNOSIS — N186 End stage renal disease: Secondary | ICD-10-CM | POA: Diagnosis not present

## 2020-09-02 DIAGNOSIS — R0781 Pleurodynia: Secondary | ICD-10-CM

## 2020-09-02 DIAGNOSIS — U071 COVID-19: Secondary | ICD-10-CM

## 2020-09-02 DIAGNOSIS — Z8616 Personal history of COVID-19: Secondary | ICD-10-CM | POA: Diagnosis not present

## 2020-09-02 DIAGNOSIS — R072 Precordial pain: Secondary | ICD-10-CM

## 2020-09-02 DIAGNOSIS — Z888 Allergy status to other drugs, medicaments and biological substances status: Secondary | ICD-10-CM

## 2020-09-02 DIAGNOSIS — I959 Hypotension, unspecified: Secondary | ICD-10-CM | POA: Diagnosis not present

## 2020-09-02 DIAGNOSIS — E440 Moderate protein-calorie malnutrition: Secondary | ICD-10-CM | POA: Diagnosis present

## 2020-09-02 DIAGNOSIS — J449 Chronic obstructive pulmonary disease, unspecified: Secondary | ICD-10-CM | POA: Diagnosis present

## 2020-09-02 DIAGNOSIS — D696 Thrombocytopenia, unspecified: Secondary | ICD-10-CM | POA: Diagnosis not present

## 2020-09-02 DIAGNOSIS — Z992 Dependence on renal dialysis: Secondary | ICD-10-CM

## 2020-09-02 DIAGNOSIS — K219 Gastro-esophageal reflux disease without esophagitis: Secondary | ICD-10-CM | POA: Diagnosis present

## 2020-09-02 DIAGNOSIS — Z800 Family history of malignant neoplasm of digestive organs: Secondary | ICD-10-CM

## 2020-09-02 DIAGNOSIS — Z8349 Family history of other endocrine, nutritional and metabolic diseases: Secondary | ICD-10-CM

## 2020-09-02 DIAGNOSIS — I5042 Chronic combined systolic (congestive) and diastolic (congestive) heart failure: Secondary | ICD-10-CM | POA: Diagnosis present

## 2020-09-02 DIAGNOSIS — N2581 Secondary hyperparathyroidism of renal origin: Secondary | ICD-10-CM | POA: Diagnosis present

## 2020-09-02 DIAGNOSIS — H04123 Dry eye syndrome of bilateral lacrimal glands: Secondary | ICD-10-CM | POA: Diagnosis present

## 2020-09-02 DIAGNOSIS — Z955 Presence of coronary angioplasty implant and graft: Secondary | ICD-10-CM

## 2020-09-02 DIAGNOSIS — Z8249 Family history of ischemic heart disease and other diseases of the circulatory system: Secondary | ICD-10-CM

## 2020-09-02 DIAGNOSIS — I429 Cardiomyopathy, unspecified: Secondary | ICD-10-CM | POA: Diagnosis present

## 2020-09-02 DIAGNOSIS — Z833 Family history of diabetes mellitus: Secondary | ICD-10-CM

## 2020-09-02 DIAGNOSIS — Z7982 Long term (current) use of aspirin: Secondary | ICD-10-CM

## 2020-09-02 DIAGNOSIS — Z6821 Body mass index (BMI) 21.0-21.9, adult: Secondary | ICD-10-CM

## 2020-09-02 DIAGNOSIS — H35033 Hypertensive retinopathy, bilateral: Secondary | ICD-10-CM | POA: Diagnosis present

## 2020-09-02 DIAGNOSIS — Z823 Family history of stroke: Secondary | ICD-10-CM

## 2020-09-02 DIAGNOSIS — Z952 Presence of prosthetic heart valve: Secondary | ICD-10-CM | POA: Diagnosis not present

## 2020-09-02 DIAGNOSIS — Z801 Family history of malignant neoplasm of trachea, bronchus and lung: Secondary | ICD-10-CM

## 2020-09-02 LAB — BASIC METABOLIC PANEL
Anion gap: 9 (ref 5–15)
BUN: 51 mg/dL — ABNORMAL HIGH (ref 6–20)
CO2: 22 mmol/L (ref 22–32)
Calcium: 8.8 mg/dL — ABNORMAL LOW (ref 8.9–10.3)
Chloride: 111 mmol/L (ref 98–111)
Creatinine, Ser: 6.94 mg/dL — ABNORMAL HIGH (ref 0.61–1.24)
GFR, Estimated: 9 mL/min — ABNORMAL LOW (ref >60)
Glucose, Bld: 120 mg/dL — ABNORMAL HIGH (ref 70–99)
Potassium: 4.4 mmol/L (ref 3.5–5.1)
Sodium: 142 mmol/L (ref 135–145)

## 2020-09-02 LAB — CBC
HCT: 36.2 % — ABNORMAL LOW (ref 39.0–52.0)
Hemoglobin: 11.8 g/dL — ABNORMAL LOW (ref 13.0–17.0)
MCH: 30.7 pg (ref 26.0–34.0)
MCHC: 32.6 g/dL (ref 30.0–36.0)
MCV: 94.3 fL (ref 80.0–100.0)
Platelets: 78 10*3/uL — ABNORMAL LOW (ref 150–400)
RBC: 3.84 MIL/uL — ABNORMAL LOW (ref 4.22–5.81)
RDW: 13.7 % (ref 11.5–15.5)
WBC: 4.6 10*3/uL (ref 4.0–10.5)
nRBC: 0 % (ref 0.0–0.2)

## 2020-09-02 LAB — TROPONIN I (HIGH SENSITIVITY)
Troponin I (High Sensitivity): 85 ng/L — ABNORMAL HIGH (ref <18)
Troponin I (High Sensitivity): 79 ng/L — ABNORMAL HIGH (ref <18)
Troponin I (High Sensitivity): 76 ng/L — ABNORMAL HIGH (ref <18)

## 2020-09-02 LAB — RESP PANEL BY RT-PCR (FLU A&B, COVID) ARPGX2
Influenza A by PCR: NEGATIVE
Influenza B by PCR: NEGATIVE
SARS Coronavirus 2 by RT PCR: NEGATIVE

## 2020-09-02 LAB — SEDIMENTATION RATE: Sed Rate: 6 mm/hr (ref 0–16)

## 2020-09-02 LAB — C-REACTIVE PROTEIN: CRP: 0.6 mg/dL (ref <1.0)

## 2020-09-02 MED ORDER — SODIUM CHLORIDE 0.9% FLUSH
3.0000 mL | Freq: Once | INTRAVENOUS | Status: AC
  Administered 2020-09-02: 3 mL via INTRAVENOUS

## 2020-09-02 MED ORDER — HEPARIN SODIUM (PORCINE) 5000 UNIT/ML IJ SOLN
5000.0000 [IU] | Freq: Two times a day (BID) | INTRAMUSCULAR | Status: DC
  Administered 2020-09-02 – 2020-09-11 (×16): 5000 [IU] via SUBCUTANEOUS
  Filled 2020-09-02 (×19): qty 1

## 2020-09-02 MED ORDER — ACETAMINOPHEN 325 MG PO TABS: 650.0000 mg | ORAL_TABLET | Freq: Four times a day (QID) | ORAL | Status: DC | PRN

## 2020-09-02 MED ORDER — ATORVASTATIN CALCIUM 80 MG PO TABS
80.0000 mg | ORAL_TABLET | Freq: Every day | ORAL | Status: DC
  Administered 2020-09-02 – 2020-09-11 (×10): 80 mg via ORAL
  Filled 2020-09-02 (×3): qty 1
  Filled 2020-09-02: qty 2
  Filled 2020-09-02 (×6): qty 1

## 2020-09-02 MED ORDER — LIDOCAINE 5 % EX PTCH
1.0000 | MEDICATED_PATCH | CUTANEOUS | Status: DC
  Administered 2020-09-02 – 2020-09-10 (×9): 1 via TRANSDERMAL
  Filled 2020-09-02 (×9): qty 1

## 2020-09-02 MED ORDER — AMLODIPINE BESYLATE 10 MG PO TABS
10.0000 mg | ORAL_TABLET | Freq: Every day | ORAL | Status: DC
  Administered 2020-09-02 – 2020-09-10 (×9): 10 mg via ORAL
  Filled 2020-09-02: qty 2
  Filled 2020-09-02 (×8): qty 1

## 2020-09-02 MED ORDER — MINOXIDIL 10 MG PO TABS
20.0000 mg | ORAL_TABLET | Freq: Every day | ORAL | Status: DC
  Administered 2020-09-02 – 2020-09-08 (×7): 20 mg via ORAL
  Filled 2020-09-02 (×8): qty 2

## 2020-09-02 MED ORDER — MORPHINE SULFATE (PF) 4 MG/ML IV SOLN
4.0000 mg | Freq: Once | INTRAVENOUS | Status: AC
  Administered 2020-09-02: 4 mg via INTRAVENOUS
  Filled 2020-09-02: qty 1

## 2020-09-02 MED ORDER — ONDANSETRON HCL 4 MG/2ML IJ SOLN
4.0000 mg | Freq: Four times a day (QID) | INTRAMUSCULAR | Status: DC | PRN
  Administered 2020-09-02: 4 mg via INTRAVENOUS
  Filled 2020-09-02: qty 2

## 2020-09-02 MED ORDER — ASPIRIN 81 MG PO CHEW
324.0000 mg | CHEWABLE_TABLET | Freq: Once | ORAL | Status: AC
  Administered 2020-09-02: 324 mg via ORAL
  Filled 2020-09-02: qty 4

## 2020-09-02 MED ORDER — ALBUTEROL SULFATE HFA 108 (90 BASE) MCG/ACT IN AERS: 2.0000 | INHALATION_SPRAY | Freq: Four times a day (QID) | RESPIRATORY_TRACT | Status: DC | PRN

## 2020-09-02 MED ORDER — PANTOPRAZOLE SODIUM 40 MG PO TBEC
40.0000 mg | DELAYED_RELEASE_TABLET | Freq: Every day | ORAL | Status: DC
  Administered 2020-09-02 – 2020-09-11 (×10): 40 mg via ORAL
  Filled 2020-09-02 (×10): qty 1

## 2020-09-02 MED ORDER — NITROGLYCERIN 0.4 MG SL SUBL
0.4000 mg | SUBLINGUAL_TABLET | SUBLINGUAL | Status: AC | PRN
  Administered 2020-09-03 – 2020-09-04 (×4): 0.4 mg via SUBLINGUAL
  Filled 2020-09-02 (×4): qty 1

## 2020-09-02 MED ORDER — ALBUTEROL SULFATE (2.5 MG/3ML) 0.083% IN NEBU
2.5000 mg | INHALATION_SOLUTION | Freq: Four times a day (QID) | RESPIRATORY_TRACT | Status: DC | PRN
  Administered 2020-09-10 – 2020-09-11 (×2): 2.5 mg via RESPIRATORY_TRACT
  Filled 2020-09-02 (×2): qty 3

## 2020-09-02 MED ORDER — ASPIRIN 81 MG PO CHEW
81.0000 mg | CHEWABLE_TABLET | Freq: Every day | ORAL | Status: DC
  Administered 2020-09-02 – 2020-09-11 (×10): 81 mg via ORAL
  Filled 2020-09-02 (×10): qty 1

## 2020-09-02 MED ORDER — UMECLIDINIUM BROMIDE 62.5 MCG/INH IN AEPB
1.0000 | INHALATION_SPRAY | Freq: Every day | RESPIRATORY_TRACT | Status: DC
  Administered 2020-09-03 – 2020-09-11 (×8): 1 via RESPIRATORY_TRACT
  Filled 2020-09-02: qty 7

## 2020-09-02 MED ORDER — CARVEDILOL 25 MG PO TABS
25.0000 mg | ORAL_TABLET | Freq: Two times a day (BID) | ORAL | Status: DC
  Administered 2020-09-02 – 2020-09-03 (×2): 25 mg via ORAL
  Filled 2020-09-02: qty 1
  Filled 2020-09-02: qty 2

## 2020-09-02 MED ORDER — HYDROCODONE-ACETAMINOPHEN 5-325 MG PO TABS
2.0000 | ORAL_TABLET | Freq: Once | ORAL | Status: AC
  Administered 2020-09-02: 2 via ORAL
  Filled 2020-09-02: qty 2

## 2020-09-02 MED ORDER — HYDRALAZINE HCL 50 MG PO TABS
75.0000 mg | ORAL_TABLET | Freq: Three times a day (TID) | ORAL | Status: DC
  Administered 2020-09-02 – 2020-09-07 (×16): 75 mg via ORAL
  Filled 2020-09-02 (×7): qty 1
  Filled 2020-09-02: qty 3
  Filled 2020-09-02 (×9): qty 1

## 2020-09-02 MED ORDER — CLONIDINE HCL 0.3 MG/24HR TD PTWK
0.3000 mg | MEDICATED_PATCH | TRANSDERMAL | Status: DC
  Administered 2020-09-02: 0.3 mg via TRANSDERMAL
  Filled 2020-09-02: qty 1

## 2020-09-02 MED ORDER — DOXAZOSIN MESYLATE 8 MG PO TABS
16.0000 mg | ORAL_TABLET | Freq: Every day | ORAL | Status: DC
  Administered 2020-09-03 – 2020-09-08 (×6): 16 mg via ORAL
  Filled 2020-09-02 (×9): qty 2

## 2020-09-02 MED ORDER — ACETAMINOPHEN 325 MG PO TABS
650.0000 mg | ORAL_TABLET | ORAL | Status: DC | PRN
  Administered 2020-09-07 – 2020-09-10 (×3): 650 mg via ORAL
  Filled 2020-09-02 (×4): qty 2

## 2020-09-02 MED ORDER — ARFORMOTEROL TARTRATE 15 MCG/2ML IN NEBU
15.0000 ug | INHALATION_SOLUTION | Freq: Two times a day (BID) | RESPIRATORY_TRACT | Status: DC
  Administered 2020-09-03 – 2020-09-10 (×15): 15 ug via RESPIRATORY_TRACT
  Filled 2020-09-02 (×20): qty 2

## 2020-09-02 NOTE — ED Notes (Signed)
[name] wife [phone] requesting an update

## 2020-09-02 NOTE — ED Notes (Signed)
No answer for triage.

## 2020-09-02 NOTE — ED Provider Notes (Signed)
[facility] EMERGENCY DEPARTMENT Provider Note   CSN: [id number] Arrival date & time: 09/02/20  0004     History Chief Complaint  Patient presents with   Chest Pain    [name] is a 50 y.o. male.  The history is provided by the patient.  Chest Pain Pain location:  L chest Pain quality: pressure   Pain radiates to:  L shoulder Pain severity:  Moderate Onset quality:  Gradual Duration:  3 days Timing:  Constant Progression:  Unchanged Chronicity:  Recurrent Relieved by:  Nothing Worsened by:  Certain positions and movement Associated symptoms: fatigue and shortness of breath   Associated symptoms: no abdominal pain, no back pain, no cough, no diaphoresis, no fever and no vomiting   Risk factors: aortic disease, coronary artery disease and male sex      Patient with extensive medical history including CAD, cardiomyopathy, aortic valve repair, previous aortic dissection presents with chest pain.  Patient reports persistent left-sided chest pain for the past 3 days.  There radiates to his left shoulder.  It is worse with position as well as palpation He reports persistent shortness of breath.  No fevers or vomiting.  No coughing.  He reports generalized weakness Past Medical History:  Diagnosis Date   Adenomatous colon polyp 08/2015   Anxiety    Aortic disease (HCC)    Aortic dissection (HCC)    a. admx 04/2014 >> L renal infarct; a/c renal failure >> b.  s/p Bioprosthetic [place] and total arch replacement and staged endovascular repair of descending aortic aneurysm ([facility] - Dr. [name])   CAD (coronary artery disease)    a. LHC 4/16:  oD1 60%   Cardiomyopathy ([place])    a. non-ischemic - probably related to untreated HTN and ETOH abuse - Echo 3/13 with EF 35-40% >> b. Echo 4/16: Severe LVH, EF 55-60%, moderate AI, moderate MR, mild LAE, trivial effusion, known type B dissection with communication between true and false lumens with suprasternal images  suggesting dissection plane may propagate to at least left subclavian takeoff, root above aortic valve okay     Chronic abdominal pain    Chronic combined systolic and diastolic congestive heart failure ([place])    a. 05/2011: Adm with pulm edema/HTN urgency, EF 35-40% with diffuse hypokinesis and moderate to severe mitral regurgitation. Cardiomyopathy likely due to uncontrolled HTN and ETOH abuse - cath deferred due to renal insufficiency (felt due to uncontrolled HTN). bJodie Echevaria MV 06/2011: EF 37% and no ischemia or infarction. c. EF 45-50% by echo 01/2012.   Chronic sinusitis    CKD (chronic kidney disease)    a. 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  peak creatinine 3.46 during admx for aortic dissection 2/16.  sees Dr [name]   DDD (degenerative disc disease), lumbar    Depression    Descending thoracic aortic aneurysm [facility])    Dissecting aneurysm of thoracic aorta ([place])    ETOH abuse    a. Reported to have quit 05/2011.   Frequent headaches    GERD (gastroesophageal reflux disease)    Headache(784.0)    "q other day" (08/08/2013)   Heart murmur    Hemorrhoid thrombosis    History of echocardiogram    Echo 1/17:  Severe LVH, EF 55-60%, no RWMA, Gr 2 DD, AVR ok, mild to mod MR, mild LAE, mild reduced RVSF, mod RAE   History of medication noncompliance    HYPERLIPIDEMIA    Hypertension    a. Hx of  HTN urgency secondary to noncompliance. b. urinary metanephrine and catecholeamine levels normal 2013.  c. Renal art [name] 1/16:  No evidence of renal artery stenosis noted bilaterally.   INGUINAL HERNIA    Pneumonia ~ 2013   Renal insufficiency    Serrated adenoma of colon 08/2015   Stroke [facility])    Tobacco abuse    Valvular heart disease    a. Echo 05/2011: moderate to severe eccentric MR and mild to moderate AI with prolapsing left coronary cusp. b. Echo 01/2012: mild-mod AI, mild dilitation of aortic root, mild MR.;  c. Echo 1/16: Severe LVH consistent with hypertrophic cardio myopathy, EF 50%, no  RWMA, mod AI, mild MR, mild RAE, dilated Ao root (40 mm);     Patient Active Problem List   Diagnosis Date Noted   Cryptogenic stroke ([place]) 05/16/2020   History of loop recorder 02/13/2020   Chest pain 11/24/2019   COPD (chronic obstructive pulmonary disease) ([place]) 11/24/2019   Nonspecific chest pain    Chest pain, rule out acute myocardial infarction 58/11/9831   Chronic systolic (congestive) heart failure ([place]) 04/25/2018   Acute on chronic combined systolic and diastolic CHF (congestive heart failure) ([place]) 12/08/2017   Coronary artery disease involving native coronary artery of native heart with angina pectoris ([place]) 12/06/2017   NSTEMI (non-ST elevated myocardial infarction) ([place]) 12/02/2017   Hypertensive emergency - Accelerated Hypertension 12/02/2017   History of stroke 10/05/2017   Somnolence, daytime 04/07/2017   Snoring 04/07/2017   Cerebral infarction due to embolism of left middle cerebral artery ([place]) 06/03/2016   History of aortic dissection 06/03/2016   Palpitation    Cerebrovascular accident (CVA) due to embolism of left middle cerebral artery ([place])    Seizures ([place]) 02/28/2016   HLD (hyperlipidemia) 08/28/2015   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[place]) 06/07/2015   Cardiomyopathy ([place]) 03/01/2015   Acid reflux 03/01/2015   Essential hypertension 03/01/2015   S/P aortic dissection repair 10/03/2014   H/O aortic valve replacement 10/03/2014   CKD (chronic kidney disease), stage IV ([place]) 05/15/2014   AKI (acute kidney injury) ([place])    Dissecting aneurysm of thoracic aorta, Stanford type B ([place]) 04/24/2014   Aortic dissection ([place]) 04/23/2014   Aneurysm of ascending aorta ([place]) 03/28/2014   Dyspnea 03/27/2014   ETOH abuse 09/20/2013   Malignant hypertension 02/20/2013   Hypertensive urgency 01/27/2012   Cardiomyopathy, hypertensive ([place]) 01/26/2012   AI (aortic insufficiency) 01/26/2012   MR (mitral regurgitation) 01/26/2012   Acute renal failure  superimposed on stage 3 chronic kidney disease ([place]) 01/26/2012   Hypertensive cardiomyopathy, without heart failure ([place]): EF reduced to 35-40% from 55%. 06/10/2011   Chronic bronchitis ([place]) 05/23/2011   Cardiomegaly - hypertensive 05/22/2011   Marijuana use 05/22/2011   Abdominal pain 05/22/2011   Hypertension, malignant 05/22/2011   Anxiety and depression 05/22/2011   Hyperlipidemia LDL goal <70 08/26/2009   Tobacco use 08/26/2009   Headache(784.0) 08/26/2009   Aortic valve disorder 03/11/2009   INGUINAL HERNIA 02/18/2009   Uncontrolled hypertension 01/16/2009    Past Surgical History:  Procedure Laterality Date   ANKLE SURGERY Bilateral    Fractures bilaterally   AORTIC VALVE SURGERY  09/2014   CORONARY ANGIOGRAPHY N/A 12/06/2017   Procedure: CORONARY ANGIOGRAPHY;  Surgeon: Belva Crome, MD;  Location: [place] [facility];  Service: Cardiovascular;  Laterality: N/A;   CORONARY ANGIOGRAPHY N/A 09/26/2018   Procedure: CORONARY ANGIOGRAPHY;  Surgeon: Nelva Bush, MD;  Location: [place] [facility];  Service: Cardiovascular;  Laterality:  N/A;   CORONARY STENT INTERVENTION N/A 12/10/2017   Procedure: CORONARY STENT INTERVENTION;  Surgeon: [name] Sine, MD;  Location: [place] [facility];  Service: Cardiovascular;  Laterality: N/A;   FOOT FRACTURE SURGERY Bilateral 2004-2010   "got pins in both of them"   HEMORRHOID SURGERY N/A 06/15/2015   Procedure: HEMORRHOIDECTOMY;  Surgeon: Stark Klein, MD;  Location: [place];  Service: General;  Laterality: N/A;   INGUINAL HERNIA REPAIR Right ~ [place] N/A 06/21/2014   Procedure: LEFT HEART CATHETERIZATION WITH CORONARY ANGIOGRAM;  Surgeon: Larey Dresser, MD;  Location: [facility] CATH LAB;  Service: Cardiovascular;  Laterality: N/A;   LOOP RECORDER INSERTION N/A 06/19/2016   Procedure: Loop Recorder Insertion;  Surgeon: Deboraha Sprang, MD;  Location: [place] CV LAB;  Service: Cardiovascular;   Laterality: N/A;   TEE WITHOUT CARDIOVERSION N/A 03/12/2016   Procedure: TRANSESOPHAGEAL ECHOCARDIOGRAM (TEE);  Surgeon: Sanda Klein, MD;  Location: [facility] ENDOSCOPY;  Service: Cardiovascular;  Laterality: N/A;       Family History  Problem Relation Age of Onset   Hypertension Mother    Hypertension Other    Colon cancer Paternal Uncle    Stroke Maternal Aunt    Heart attack Brother    Hypertension Brother    Diabetes Maternal Aunt    Lung cancer Maternal Uncle    Stroke Maternal Uncle    Other Sister        "breathing machine at night"   Headache Sister    Thyroid disease Sister    Throat cancer Neg Hx    Pancreatic cancer Neg Hx    Esophageal cancer Neg Hx    Kidney disease Neg Hx    Liver disease Neg Hx     Social History   Tobacco Use   Smoking status: Some Days    Packs/day: 0.25    Years: 23.00    Pack years: 5.75    Types: Cigarettes   Smokeless tobacco: Never   Tobacco comments:    3 to 4 per day  Substance Use Topics   Alcohol use: Yes    Alcohol/week: 1.0 standard drink    Types: 1 Cans of beer per week    Comment: occasionally   Drug use: Yes    Types: Marijuana    Comment: 2 times/week    Home Medications Prior to Admission medications   Medication Sig Start Date End Date Taking? Authorizing Provider  acetaminophen (TYLENOL) 325 MG tablet Take 650 mg by mouth every 6 (six) hours as needed for moderate pain (for pain or headaches).    [provider]  albuterol (VENTOLIN HFA) 108 (90 Base) MCG/ACT inhaler Inhale 2 puffs into the lungs every 6 (six) hours as needed for wheezing or shortness of breath.    [provider]  amLODipine (NORVASC) 10 MG tablet Take 1 tablet (10 mg total) by mouth daily. 05/23/20   Larey Dresser, MD  aspirin EC 81 MG EC tablet Take 1 tablet (81 mg total) by mouth daily. 12/12/17   [facility], Tami Lin, PA  atorvastatin (LIPITOR) 80 MG tablet Take 1 tablet (80 mg total) by mouth daily. 05/19/20   Georgette Shell, MD  Blood Pressure Monitor KIT 1 Device by Does not apply route 2 (two) times daily. 11/29/18   Kerin Perna, NP  carvedilol (COREG) 25 MG tablet Take 25 mg by mouth 2 (two) times daily with a meal.    [provider]  cloNIDine (CATAPRES - DOSED IN MG/24 HR) 0.3 mg/24hr patch Place 1 patch (0.3 mg total) onto the skin once a week. 09/28/18   Arrien, Jimmy Picket, MD  doxazosin (CARDURA) 8 MG tablet TAKE 2 TABLETS(16 MG) BY MOUTH AT BEDTIME 06/21/20   Skeet Latch, MD  Evolocumab (REPATHA SURECLICK) 941 MG/ML SOAJ Inject 1 Dose into the skin every 14 (fourteen) days. 11/29/19   Hilty, Nadean Corwin, MD  hydrALAZINE (APRESOLINE) 50 MG tablet Take 1.5 tablets (75 mg total) by mouth 3 (three) times daily. 08/30/20 08/30/21  Rafael Bihari, FNP  lidocaine (LIDODERM) 5 % PLACE 1 PATCH ONTO THE SKIN EVERY 12 HOURS REMOVE AND DISCARD PATCH WITHIN 12 HOURS OR AS DIRECTED BY MD 07/23/20   Lyda Jester M, PA-C  MINOXIDIL PO Take 20 mg by mouth daily.    [provider]  pantoprazole (PROTONIX) 40 MG tablet Take 1 tablet (40 mg total) by mouth daily. 11/28/19   Larey Dresser, MD  STIOLTO RESPIMAT 2.5-2.5 MCG/ACT AERS INHALE 2 PUFFS INTO THE LUNGS DAILY 09/06/19   Spero Geralds, MD    Allergies    Imdur [isosorbide nitrate] and Tramadol  Review of Systems   Review of Systems  Constitutional:  Positive for fatigue. Negative for diaphoresis and fever.  Respiratory:  Positive for shortness of breath. Negative for cough.   Cardiovascular:  Positive for chest pain.  Gastrointestinal:  Negative for abdominal pain and vomiting.  Musculoskeletal:  Negative for back pain.  All other systems reviewed and are negative.  Physical Exam Updated Vital Signs BP 129/80   Pulse 97   Temp 98.8 F (37.1 C) (Oral)   Resp 16   Ht 1.803 m (_0 )   Wt 68.9 kg   SpO2 97%   BMI 21.20 kg/m   Physical Exam CONSTITUTIONAL: Well developed/well nourished HEAD:  Normocephalic/atraumatic EYES: EOMI/PERRL ENMT: Mucous membranes moist NECK: supple no meningeal signs SPINE/BACK:entire spine nontender CV: S1/S2 noted, murmur noted LUNGS: Lungs are clear to auscultation bilaterally, no apparent distress Chest-diffuse left-sided chest wall tenderness without crepitus ABDOMEN: soft, nontender, no rebound or guarding, bowel sounds noted throughout abdomen GU:no cva tenderness NEURO: Pt is awake/alert/appropriate, moves all extremitiesx4.  No facial droop.  No focal arm or leg weakness EXTREMITIES: pulses normal/equalx4, full ROM, no calf tenderness SKIN: warm, color normal Midline sternotomy scar noted PSYCH: no abnormalities of mood noted, alert and oriented to situation  ED Results / Procedures / Treatments   Labs (all labs ordered are listed, but only abnormal results are displayed) Labs Reviewed  BASIC METABOLIC PANEL - Abnormal; Notable for the following components:      Result Value   Glucose, Bld 120 (*)    BUN 51 (*)    Creatinine, Ser 6.94 (*)    Calcium 8.8 (*)    GFR, Estimated 9 (*)    All other components within normal limits  CBC - Abnormal; Notable for the following components:   RBC 3.84 (*)    Hemoglobin 11.8 (*)    HCT 36.2 (*)    Platelets 78 (*)    All other components within normal limits  TROPONIN I (HIGH SENSITIVITY) - Abnormal; Notable for the following components:   Troponin I (High Sensitivity) 85 (*)    All other components within normal limits  TROPONIN I (HIGH SENSITIVITY)    EKG EKG Interpretation  Date/Time:  Monday September 02 2020 00:41:29 EDT Ventricular Rate:  104 PR Interval:  148 QRS Duration:  88 QT Interval:  358 QTC Calculation: 470 R Axis:   21 Text Interpretation: Sinus tachycardia Possible Left atrial enlargement Left ventricular hypertrophy with repolarization abnormality ( Cornell product ) Abnormal ECG Confirmed by Ripley Fraise [phone]) on 09/02/2020 3:06:36 AM  Radiology DG Chest 2  View  Result Date: 09/02/2020 CLINICAL DATA:  Chest pain EXAM: CHEST - 2 VIEW COMPARISON:  05/17/2020 FINDINGS: Status post median sternotomy. Previous stent graft repair of thoracic aorta. Loop recorder is noted along the left heart border. Heart size normal. Parenchymal scarring scratch set pleural and parenchymal scarring within the left mid and left lower lung noted. No airspace consolidation identified. IMPRESSION: No acute cardiopulmonary abnormalities. Electronically Signed   By: Kerby Moors M.D.   On: 09/02/2020 02:25    Procedures Procedures   Medications Ordered in ED Medications  nitroGLYCERIN (NITROSTAT) SL tablet 0.4 mg (has no administration in time range)  sodium chloride flush (NS) 0.9 % injection 3 mL (3 mLs Intravenous Given 09/02/20 0569)  aspirin chewable tablet 324 mg (324 mg Oral Given 09/02/20 0336)  HYDROcodone-acetaminophen (NORCO/VICODIN) 5-325 MG per tablet 2 tablet (2 tablets Oral Given 09/02/20 7948)    ED Course  I have reviewed the triage vital signs and the nursing notes.  Pertinent labs & imaging results that were available during my care of the patient were reviewed by me and considered in my medical decision making (see chart for details).    MDM Rules/Calculators/A&P                          3:36 AM Patient with extensive history including CAD, aortic dissection, AVR presents with chest pain Patient reports the pain is been present for 3 days constantly No acute EKG changes.  I reviewed the chest x-ray personally there are no acute changes Troponin is mildly elevated though near baseline.  Patient does have worsening renal function but reports he is scheduled to start dialysis soon When I review his chart, patient has frequent episodes of chest pain.  The last cardiology note reveals patient essentially has chronic chest pain that is typically reproducible. This seems consistent with tonight's presentation.  We will treat patient's pain and  reassess 5:42 AM Troponin is essentially unchanged, but patient reports continued pain.  Patient reports he does have pain frequently, but feels that this is different.  He reports "I know something is wrong" Patient has such a significant cardiac history it is very challenging to determine the acuity of this pain.  Currently his vital signs are appropriate, he has distal pulses intact/no focal weakness to suggest aortic dissection.  No pleuritic pain is reported to suggest acute PE.  Discussed the case with cardiology fellow Dr. Renella Cunas. He recommends repeat troponin to further trend for any signs of ischemia, will have the cardiology team see patient later this morning 7:04 AM Signed out to Dr Tyrone Nine at shift change Pt updated on plan Final Clinical Impression(s) / ED Diagnoses Final diagnoses:  Precordial pain  ESRD (end stage renal disease) [facility])    Rx / DC Orders ED Discharge Orders     None        Ripley Fraise, MD 09/02/20 [phone]

## 2020-09-02 NOTE — ED Notes (Signed)
Provider at bedside

## 2020-09-02 NOTE — H&P (Signed)
History and Physical    [name] AST:[id number] DOB: 25-Jan-1971 DOA: 09/02/2020  PCP: Kerin Perna, NP (Confirm with patient/family/NH records and if not entered, this has to be entered at [facility] point of entry) Patient coming from: Home  I have personally briefly reviewed patient's old medical records in [place]  Chief Complaint: Chest pain  HPI: [name] is a 50 y.o. 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  Patient has had multiple episodes of chest pain this year, all these episodes always considered not ACS etiology.  This time, patient started to have left-sided chest pains, dull to pressure-like episodic, each episode last for 5 to 10 minutes, worsening with upper torso movement and left arm movement, relieved with sitting down.  Denied any associated symptoms such as feeling nauseous vomiting, palpitations or sweating.  She has been taking around-the-clock Tylenol to prevent this episode from happening however he still has 5/10 pain this morning, and came to ED.  Telemetry is cardiology increased hydralazine dosage, he used to take Imdur which was removed.  He still taking clonidine patch and he was told by his nephrology need to start hemodialysis in the next 39-month  ED Course: trop 85>79>76, EKG showed LVH with secondary changes chronic.  Blood pressure mildly elevated, done fairly controlled with resuming p.o. BP meds at home.  Review of Systems: As per HPI otherwise 14 point review of systems negative.    Past Medical History:  Diagnosis Date   Adenomatous colon polyp 08/2015   Anxiety    Aortic disease ([facility]    Aortic dissection (HCC)    a. admx 04/2014  >> L renal infarct; a/c renal failure >> b.  s/p Bioprosthetic Bentall and total arch replacement and staged endovascular repair of descending aortic aneurysm ([facility] - Dr. HYsidro Evert   CAD (coronary artery disease)    a. LHC 4/16:  oD1 60%   Cardiomyopathy ([facility]    a. non-ischemic - probably related to untreated HTN and ETOH abuse - Echo 3/13 with EF 35-40% >> b. Echo 4/16: Severe LVH, EF 55-60%, moderate AI, moderate MR, mild LAE, trivial effusion, known type B dissection with communication between true and false lumens with suprasternal images suggesting dissection plane may propagate to at least left subclavian takeoff, root above aortic valve okay     Chronic abdominal pain    Chronic combined systolic and diastolic congestive heart failure ([facility]    a. 05/2011: Adm with pulm edema/HTN urgency, EF 35-40% with diffuse hypokinesis and moderate to severe mitral regurgitation. Cardiomyopathy likely due to uncontrolled HTN and ETOH abuse - cath deferred due to renal insufficiency (felt due to uncontrolled HTN). b.Jodie EchevariaMV 06/2011: EF 37% and no ischemia or infarction. c. EF 45-50% by echo 01/2012.   Chronic sinusitis    CKD (chronic kidney disease)    a. 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  peak creatinine 3.46 during admx for aortic dissection 2/16.  sees Dr PFlorene Glen  DDD (degenerative disc disease), lumbar    Depression    Descending thoracic aortic aneurysm ([facility]    Dissecting aneurysm of thoracic aorta ([facility]    ETOH abuse    a. Reported to have quit 05/2011.  Frequent headaches    GERD (gastroesophageal reflux disease)    Headache(784.0)    "q other day" (08/08/2013)   Heart murmur    Hemorrhoid thrombosis    History of echocardiogram    Echo 1/17:  Severe LVH, EF 55-60%, no RWMA, Gr 2 DD, AVR ok, mild to mod MR, mild LAE, mild reduced RVSF, mod RAE   History of medication noncompliance    HYPERLIPIDEMIA    Hypertension    a. Hx of HTN urgency secondary to noncompliance. b. urinary metanephrine  and catecholeamine levels normal 2013.  c. Renal art [name] 1/16:  No evidence of renal artery stenosis noted bilaterally.   INGUINAL HERNIA    Pneumonia ~ 2013   Renal insufficiency    Serrated adenoma of colon 08/2015   Stroke [facility])    Tobacco abuse    Valvular heart disease    a. Echo 05/2011: moderate to severe eccentric MR and mild to moderate AI with prolapsing left coronary cusp. b. Echo 01/2012: mild-mod AI, mild dilitation of aortic root, mild MR.;  c. Echo 1/16: Severe LVH consistent with hypertrophic cardio myopathy, EF 50%, no RWMA, mod AI, mild MR, mild RAE, dilated Ao root (40 mm);     Past Surgical History:  Procedure Laterality Date   ANKLE SURGERY Bilateral    Fractures bilaterally   AORTIC VALVE SURGERY  09/2014   CORONARY ANGIOGRAPHY N/A 12/06/2017   Procedure: CORONARY ANGIOGRAPHY;  Surgeon: Belva Crome, MD;  Location: [place] CV LAB;  Service: Cardiovascular;  Laterality: N/A;   CORONARY ANGIOGRAPHY N/A 09/26/2018   Procedure: CORONARY ANGIOGRAPHY;  Surgeon: Nelva Bush, MD;  Location: [place] CV LAB;  Service: Cardiovascular;  Laterality: N/A;   CORONARY STENT INTERVENTION N/A 12/10/2017   Procedure: CORONARY STENT INTERVENTION;  Surgeon: [name] Sine, MD;  Location: [place] CV LAB;  Service: Cardiovascular;  Laterality: N/A;   FOOT FRACTURE SURGERY Bilateral 2004-2010   "got pins in both of them"   HEMORRHOID SURGERY N/A 06/15/2015   Procedure: HEMORRHOIDECTOMY;  Surgeon: Stark Klein, MD;  Location: [place];  Service: General;  Laterality: N/A;   INGUINAL HERNIA REPAIR Right ~ [place] N/A 06/21/2014   Procedure: LEFT HEART CATHETERIZATION WITH CORONARY ANGIOGRAM;  Surgeon: Larey Dresser, MD;  Location: [facility] CATH LAB;  Service: Cardiovascular;  Laterality: N/A;   LOOP RECORDER INSERTION N/A 06/19/2016   Procedure: Loop Recorder Insertion;  Surgeon: Deboraha Sprang, MD;  Location: [place] CV LAB;  Service:  Cardiovascular;  Laterality: N/A;   TEE WITHOUT CARDIOVERSION N/A 03/12/2016   Procedure: TRANSESOPHAGEAL ECHOCARDIOGRAM (TEE);  Surgeon: Sanda Klein, MD;  Location: [facility] ENDOSCOPY;  Service: Cardiovascular;  Laterality: N/A;     reports that he has been smoking cigarettes. He has a 5.75 pack-year smoking history. He has never used smokeless tobacco. He reports current alcohol use of about 1.0 standard drink of alcohol per week. He reports current drug use. Drug: Marijuana.  Allergies  Allergen Reactions   Imdur [Isosorbide Nitrate] Other (See Comments)    Headaches    Tramadol Other (See Comments)    Headaches     Family History  Problem Relation Age of Onset   Hypertension Mother    Hypertension Other    Colon cancer Paternal Uncle    Stroke Maternal Aunt    Heart attack Brother    Hypertension Brother    Diabetes Maternal Aunt    Lung cancer Maternal Uncle  Stroke Maternal Uncle    Other Sister        "breathing machine at night"   Headache Sister    Thyroid disease Sister    Throat cancer Neg Hx    Pancreatic cancer Neg Hx    Esophageal cancer Neg Hx    Kidney disease Neg Hx    Liver disease Neg Hx      Prior to Admission medications   Medication Sig Start Date End Date Taking? Authorizing Provider  acetaminophen (TYLENOL) 325 MG tablet Take 650 mg by mouth every 6 (six) hours as needed for moderate pain (for pain or headaches).   Yes [provider]  albuterol (VENTOLIN HFA) 108 (90 Base) MCG/ACT inhaler Inhale 2 puffs into the lungs every 6 (six) hours as needed for wheezing or shortness of breath.   Yes [provider]  amLODipine (NORVASC) 10 MG tablet Take 1 tablet (10 mg total) by mouth daily. 05/23/20  Yes Larey Dresser, MD  aspirin EC 81 MG EC tablet Take 1 tablet (81 mg total) by mouth daily. 12/12/17  Yes [facility], Tami Lin, PA  atorvastatin (LIPITOR) 80 MG tablet Take 1 tablet (80 mg total) by mouth daily. 05/19/20  Yes Georgette Shell, MD  Blood Pressure Monitor KIT 1 Device by Does not apply route 2 (two) times daily. 11/29/18  Yes Kerin Perna, NP  carvedilol (COREG) 25 MG tablet Take 25 mg by mouth 2 (two) times daily with a meal.   Yes [provider]  cloNIDine (CATAPRES - DOSED IN MG/24 HR) 0.3 mg/24hr patch Place 1 patch (0.3 mg total) onto the skin once a week. 09/28/18  Yes Arrien, Jimmy Picket, MD  doxazosin (CARDURA) 8 MG tablet TAKE 2 TABLETS(16 MG) BY MOUTH AT BEDTIME Patient taking differently: Take 16 mg by mouth at bedtime. 06/21/20  Yes Skeet Latch, MD  Evolocumab (REPATHA SURECLICK) 368 MG/ML SOAJ Inject 1 Dose into the skin every 14 (fourteen) days. 11/29/19  Yes Hilty, Nadean Corwin, MD  hydrALAZINE (APRESOLINE) 50 MG tablet Take 1.5 tablets (75 mg total) by mouth 3 (three) times daily. 08/30/20 08/30/21 Yes [place], Maricela Bo, FNP  lidocaine (LIDODERM) 5 % PLACE 1 PATCH ONTO THE SKIN EVERY 12 HOURS REMOVE AND DISCARD PATCH WITHIN 12 HOURS OR AS DIRECTED BY MD Patient taking differently: Place 1 patch onto the skin every 12 (twelve) hours. 07/23/20  Yes Lyda Jester M, PA-C  MINOXIDIL PO Take 20 mg by mouth daily.   Yes [provider]  pantoprazole (PROTONIX) 40 MG tablet Take 1 tablet (40 mg total) by mouth daily. 11/28/19  Yes Larey Dresser, MD  STIOLTO RESPIMAT 2.5-2.5 MCG/ACT AERS INHALE 2 PUFFS INTO THE LUNGS DAILY 09/06/19  Yes Spero Geralds, MD    Physical Exam: Vitals:   09/02/20 1345 09/02/20 1400 09/02/20 1415 09/02/20 1430  BP: (!) 147/96 (!) 166/96 (!) 153/92 (!) 144/89  Pulse: 90 90 85 88  Resp: 15 (!) '22 15 14  ' Temp:      TempSrc:      SpO2: 97% 99% 97% 96%  Weight:      Height:        Constitutional: NAD, calm, comfortable Vitals:   09/02/20 1345 09/02/20 1400 09/02/20 1415 09/02/20 1430  BP: (!) 147/96 (!) 166/96 (!) 153/92 (!) 144/89  Pulse: 90 90 85 88  Resp: 15 (!) '22 15 14  ' Temp:      TempSrc:      SpO2: 97% 99%  97% 96%   Weight:      Height:       Eyes: PERRL, lids and conjunctivae normal ENMT: Mucous membranes are moist. Posterior pharynx clear of any exudate or lesions.Normal dentition.  Neck: normal, supple, no masses, no thyromegaly Respiratory: clear to auscultation bilaterally, no wheezing, no crackles. Normal respiratory effort. No accessory muscle use. Tenderness on left lower rib cage on palpation. Cardiovascular: Regular rate and rhythm, loud blowing murmur on heart base, increased S2. No extremity edema. 2+ pedal pulses. No carotid bruits.  Abdomen: no tenderness, no masses palpated. No hepatosplenomegaly. Bowel sounds positive.  Musculoskeletal: no clubbing / cyanosis. No joint deformity upper and lower extremities. Good ROM, no contractures. Normal muscle tone.  Skin: no rashes, lesions, ulcers. No induration Neurologic: CN 2-12 grossly intact. Sensation intact, DTR normal. Strength 5/5 in all 4.  Psychiatric: Normal judgment and insight. Alert and oriented x 3. Normal mood.    Labs on Admission: I have personally reviewed following labs and imaging studies  CBC: Recent Labs  Lab 09/02/20 0118  WBC 4.6  HGB 11.8*  HCT 36.2*  MCV 94.3  PLT 78*   Basic Metabolic Panel: Recent Labs  Lab 08/30/20 1037 09/02/20 0118  NA 139 142  K 4.6 4.4  CL 110 111  CO2 21* 22  GLUCOSE 94 120*  BUN 52* 51*  CREATININE 6.53* 6.94*  CALCIUM 8.9 8.8*   GFR: Estimated Creatinine Clearance: 12.7 mL/min (A) (by C-G formula based on SCr of 6.94 mg/dL (H)). Liver Function Tests: No results for input(s): AST, ALT, ALKPHOS, BILITOT, PROT, ALBUMIN in the last 168 hours. No results for input(s): LIPASE, AMYLASE in the last 168 hours. No results for input(s): AMMONIA in the last 168 hours. Coagulation Profile: No results for input(s): INR, PROTIME in the last 168 hours. Cardiac Enzymes: No results for input(s): CKTOTAL, CKMB, CKMBINDEX, TROPONINI in the last 168 hours. BNP (last 3 results) No  results for input(s): PROBNP in the last 8760 hours. HbA1C: No results for input(s): HGBA1C in the last 72 hours. CBG: No results for input(s): GLUCAP in the last 168 hours. Lipid Profile: No results for input(s): CHOL, HDL, LDLCALC, TRIG, CHOLHDL, LDLDIRECT in the last 72 hours. Thyroid Function Tests: No results for input(s): TSH, T4TOTAL, FREET4, T3FREE, THYROIDAB in the last 72 hours. Anemia Panel: No results for input(s): VITAMINB12, FOLATE, FERRITIN, TIBC, IRON, RETICCTPCT in the last 72 hours. Urine analysis:    Component Value Date/Time   COLORURINE YELLOW 01/31/2017 [place] 01/31/2017 1203   LABSPEC >1.046 (H) 01/31/2017 1203   PHURINE 5.0 01/31/2017 1203   GLUCOSEU NEGATIVE 01/31/2017 [place] 01/31/2017 1203   HGBUR negative 02/18/2009 0901   BILIRUBINUR NEGATIVE 01/31/2017 [place] 01/31/2017 1203   PROTEINUR 100 (A) 01/31/2017 1203   UROBILINOGEN 0.2 01/02/2015 1945   NITRITE NEGATIVE 01/31/2017 1203   LEUKOCYTESUR NEGATIVE 01/31/2017 1203    Radiological Exams on Admission: DG Chest 2 View  Result Date: 09/02/2020 CLINICAL DATA:  Chest pain EXAM: CHEST - 2 VIEW COMPARISON:  05/17/2020 FINDINGS: Status post median sternotomy. Previous stent graft repair of thoracic aorta. Loop recorder is noted along the left heart border. Heart size normal. Parenchymal scarring scratch set pleural and parenchymal scarring within the left mid and left lower lung noted. No airspace consolidation identified. IMPRESSION: No acute cardiopulmonary abnormalities. Electronically Signed   By: Kerby Moors M.D.   On: 09/02/2020 02:25    EKG: Independently  reviewed.  Sinus, LVH, chronic nonspecific ST changes.  Assessment/Plan Active Problems:   Chest pain, rule out acute myocardial infarction   Chest pain  (please populate well all problems here in Problem List. (For example, if patient is on BP meds at home and you resume or decide to hold  them, it is a problem that needs to be her. Same for CAD, COPD, HLD and so on)  Chest pain -Recurrent, ACS unlikely given the character of his chest pain as well as flat pattern of troponin levels.  Etiology considered more likely skeletal muscular related, as patient reported he has had chest pain since 2016 after at open heart surgery for the AAA and valve replacement.  However, other etiologies such as uremic pericarditis cannot be ruled out given his worsening kidney function and uremia although not overwhelmingly high.  Agreed with cardiology to check echocardiogram. -Trial of lidocaine patch.  Refractory HTN -Continue current regimen, including clonidine patch, consider increase hydralazine dosage if indicated.  X2 and Imdur if indicated.  Will monitor and decide.  CKD stage IV -As above, ProTrach echocardiograms if there is a signs of pericarditis, will consider consult nephrology to start HD inpatient.  Chronic systolic CHF -Euvolemic, no indication for diuresis.  Biosynthetic AVR and mitral valve -Appears to be stable.  HLD -Outpatient Repatha  Chronic thrombocytopenia -Stable, monitoring.  DVT prophylaxis: Heparin subcu Code Status: Full code Family Communication: None at bedside Disposition Plan: Expect less than 2 midnight hospital stay Consults called: Cardiology Admission status: Telemetry observation   Lequita Halt MD [facility] Pager [phone]  09/02/2020, 3:25 PM

## 2020-09-02 NOTE — ED Notes (Signed)
Cardiology at bedside.

## 2020-09-02 NOTE — Consult Note (Addendum)
Cardiology Consultation:   Patient ID: TERRIS GERMANO; [id number]; [birth year]   Admit date: 09/02/2020 Date of Consult: 09/02/2020  Primary Care Provider: Kerin Perna, NP Primary Cardiologist: Dr. [name], MD   Patient Profile:   [name] is a 50 y.o. male with a hx of CAD with NSTEMI 11/2017 s/p PCI to ramus, tobacco use, CKD Stage III, HTN, AAA repair 2016 at 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 Christy Gentles.  History of Present Illness:   [name] is a 50yo M with a hx as stated above who presented to [facility] 09/02/20 with a 3 day hx of persistent chest pain with radiation to his left arm. He has a long cardiac hx as outlined below however he reports that his most recent episode of chest pain has been worse and more intense than what he has experienced in the past. He states that he was recently sick with COVID about 2-3 weeks ago and states that he was doing well until 3 days ago when he began having very sharp chest pain that seemed worse when laying flat and with deep inspiration. He states he has been taking Tylenol which would ease the pain slightly but it would return. He denies SOB, LE edema, orthopnea, palpitations, or syncope. Given his intense symptoms, he presented to the ED for further evaluation.   In the ED, HsT found to be 85>>79>>76. Creatinine found to be 6.94>>follows with OP nephrology with plans to start HD in the next two weeks. EKG with no acute ST changes, LVH and repolarization abnormalities. Pain was found to be reproducible on EDP exam. CXR with no cardiac abnormalities.  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 Descending thoracic aorta was dilated to 5.1cm.  LHC performed 06/2014 showed a 60% 1st Diag lesion. He underwent AVR with bovine tissue and aortic root replacement and endovascular repair of the descending thoracic aortic aneurysm at [facility]. He has had intermittent issues with chest pain since that time.   In 2019 he was seen for NSTEMI with subsequent LHC with PCI/DES to proximal Ramus vessel. Repeat cath in 09/2018 with non-obstructive disease. Most recent evaluation for chest pain 11/2019 with stress testing that showed no ischemia. Echocardiogram at that time with and LVEF at 60-65% with severe LVH, normal RV, and bioprosthetic aortic valve with mean gradient 18 mmHg.   He was then seen by Dr. Aundra Dubin 04/2020 with continued c/o chest pain felt to be consistent with GERD. He was referred to GI for further evaluation. BP meds were adjusted and Hydralazine 52m TID was added to his regimen. Renal artery doppler study was ordered to r/o RAS which showed moderate RAS on the right with 1-59% stenosis. He eventually went to the ED 05/17/20 for chest pain symptoms with a HsT at 136 however creatinine was elevated at 3.92. Repeat echo performed that showed preserved LVEF severe LVH, increased mean gradient of bioprosthetic AVR, 31 mmHg. He had localized chest tenderness that was responsive to topical lidoderm patch. HsT not felt to be consistent with NSTEMI.   In recent follow up with AHF clinic 08/30/20 he continued to have generalized fatigue but no escalating chest pain. Weight was 152lb. He did  follow with renal that same day with discussion the possible need to HD.   Past Medical History:  Diagnosis Date   Adenomatous colon polyp 08/2015   Anxiety    Aortic disease ([place])    Aortic dissection (HCC)    a. admx 04/2014 >> L renal infarct; a/c renal failure >> b.  s/p Bioprosthetic Bentall and total arch replacement and staged endovascular repair of descending aortic aneurysm ([facility] - Dr. [name])   CAD (coronary artery disease)    a. LHC 4/16:  oD1 60%    Cardiomyopathy ([place])    a. non-ischemic - probably related to untreated HTN and ETOH abuse - Echo 3/13 with EF 35-40% >> b. Echo 4/16: Severe LVH, EF 55-60%, moderate AI, moderate MR, mild LAE, trivial effusion, known type B dissection with communication between true and false lumens with suprasternal images suggesting dissection plane may propagate to at least left subclavian takeoff, root above aortic valve okay     Chronic abdominal pain    Chronic combined systolic and diastolic congestive heart failure ([place])    a. 05/2011: Adm with pulm edema/HTN urgency, EF 35-40% with diffuse hypokinesis and moderate to severe mitral regurgitation. Cardiomyopathy likely due to uncontrolled HTN and ETOH abuse - cath deferred due to renal insufficiency (felt due to uncontrolled HTN). bJodie Echevaria MV 06/2011: EF 37% and no ischemia or infarction. c. EF 45-50% by echo 01/2012.   Chronic sinusitis    CKD (chronic kidney disease)    a. 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  peak creatinine 3.46 during admx for aortic dissection 2/16.  sees Dr [name]   DDD (degenerative disc disease), lumbar    Depression    Descending thoracic aortic aneurysm [facility])    Dissecting aneurysm of thoracic aorta ([place])    ETOH abuse    a. Reported to have quit 05/2011.   Frequent headaches    GERD (gastroesophageal reflux disease)    Headache(784.0)    "q other day" (08/08/2013)   Heart murmur    Hemorrhoid thrombosis    History of echocardiogram    Echo 1/17:  Severe LVH, EF 55-60%, no RWMA, Gr 2 DD, AVR ok, mild to mod MR, mild LAE, mild reduced RVSF, mod RAE   History of medication noncompliance    HYPERLIPIDEMIA    Hypertension    a. Hx of HTN urgency secondary to noncompliance. b. urinary metanephrine and catecholeamine levels normal 2013.  c. Renal art [name] 1/16:  No evidence of renal artery stenosis noted bilaterally.   INGUINAL HERNIA    Pneumonia ~ 2013   Renal insufficiency    Serrated adenoma of colon 08/2015   Stroke [facility])     Tobacco abuse    Valvular heart disease    a. Echo 05/2011: moderate to severe eccentric MR and mild to moderate AI with prolapsing left coronary cusp. b. Echo 01/2012: mild-mod AI, mild dilitation of aortic root, mild MR.;  c. Echo 1/16: Severe LVH consistent with hypertrophic cardio myopathy, EF 50%, no RWMA, mod AI, mild MR, mild RAE, dilated Ao root (40 mm);     Past Surgical History:  Procedure Laterality Date   ANKLE SURGERY Bilateral    Fractures bilaterally   AORTIC VALVE SURGERY  09/2014   CORONARY ANGIOGRAPHY N/A 12/06/2017   Procedure: CORONARY ANGIOGRAPHY;  Ewing: Belva Crome, MD;  Location: [place] [facility];  Service: Cardiovascular;  Laterality: N/A;   CORONARY ANGIOGRAPHY N/A 09/26/2018   Procedure: CORONARY ANGIOGRAPHY;  Ewing: Nelva Bush, MD;  Location: [place] CV LAB;  Service: Cardiovascular;  Laterality: N/A;   CORONARY STENT INTERVENTION N/A 12/10/2017   Procedure: CORONARY STENT INTERVENTION;  Ewing: [name] Sine, MD;  Location: [place] CV [facility];  Service: Cardiovascular;  Laterality: N/A;   FOOT FRACTURE SURGERY Bilateral 2004-2010   "got pins in both of them"   HEMORRHOID SURGERY N/A 06/15/2015   Procedure: HEMORRHOIDECTOMY;  Ewing: Stark Klein, MD;  Location: [place];  Service: General;  Laterality: N/A;   INGUINAL HERNIA REPAIR Right ~ [place] N/A 06/21/2014   Procedure: LEFT HEART CATHETERIZATION WITH CORONARY ANGIOGRAM;  Ewing: Larey Dresser, MD;  Location: [facility] CATH LAB;  Service: Cardiovascular;  Laterality: N/A;   LOOP RECORDER INSERTION N/A 06/19/2016   Procedure: Loop Recorder Insertion;  Ewing: Deboraha Sprang, MD;  Location: [place] CV LAB;  Service: Cardiovascular;  Laterality: N/A;   TEE WITHOUT CARDIOVERSION N/A 03/12/2016   Procedure: TRANSESOPHAGEAL ECHOCARDIOGRAM (TEE);  Ewing: Sanda Klein, MD;  Location: [facility] ENDOSCOPY;  Service: Cardiovascular;  Laterality: N/A;      Prior to Admission medications   Medication Sig Start Date End Date Taking? Authorizing Provider  acetaminophen (TYLENOL) 325 MG tablet Take 650 mg by mouth every 6 (six) hours as needed for moderate pain (for pain or headaches).    [provider]  albuterol (VENTOLIN HFA) 108 (90 Base) MCG/ACT inhaler Inhale 2 puffs into the lungs every 6 (six) hours as needed for wheezing or shortness of breath.    [provider]  amLODipine (NORVASC) 10 MG tablet Take 1 tablet (10 mg total) by mouth daily. 05/23/20   Larey Dresser, MD  aspirin EC 81 MG EC tablet Take 1 tablet (81 mg total) by mouth daily. 12/12/17   [facility], Tami Lin, PA  atorvastatin (LIPITOR) 80 MG tablet Take 1 tablet (80 mg total) by mouth daily. 05/19/20   Georgette Shell, MD  Blood Pressure Monitor KIT 1 Device by Does not apply route 2 (two) times daily. 11/29/18   Kerin Perna, NP  carvedilol (COREG) 25 MG tablet Take 25 mg by mouth 2 (two) times daily with a meal.    [provider]  cloNIDine (CATAPRES - DOSED IN MG/24 HR) 0.3 mg/24hr patch Place 1 patch (0.3 mg total) onto the skin once a week. 09/28/18   Arrien, Jimmy Picket, MD  doxazosin (CARDURA) 8 MG tablet TAKE 2 TABLETS(16 MG) BY MOUTH AT BEDTIME 06/21/20   Skeet Latch, MD  Evolocumab (REPATHA SURECLICK) 707 MG/ML SOAJ Inject 1 Dose into the skin every 14 (fourteen) days. 11/29/19   Hilty, Nadean Corwin, MD  hydrALAZINE (APRESOLINE) 50 MG tablet Take 1.5 tablets (75 mg total) by mouth 3 (three) times daily. 08/30/20 08/30/21  Rafael Bihari, FNP  lidocaine (LIDODERM) 5 % PLACE 1 PATCH ONTO THE SKIN EVERY 12 HOURS REMOVE AND DISCARD PATCH WITHIN 12 HOURS OR AS DIRECTED BY MD 07/23/20   Lyda Jester M, PA-C  MINOXIDIL PO Take 20 mg by mouth daily.    [provider]  pantoprazole (PROTONIX) 40 MG tablet Take 1 tablet (40 mg total) by mouth daily. 11/28/19   Larey Dresser, MD  STIOLTO RESPIMAT 2.5-2.5 MCG/ACT AERS  INHALE 2 PUFFS INTO THE LUNGS DAILY 09/06/19   Spero Geralds, MD    Inpatient Medications: Scheduled Meds:  Continuous Infusions:  PRN Meds: nitroGLYCERIN  Allergies:    Allergies  Allergen Reactions   Imdur [Isosorbide Nitrate]  Other (See Comments)    Headaches    Tramadol Other (See Comments)    Headaches     Social History:   Social History   Socioeconomic History   Marital status: Married    Spouse name: Not on file   Number of children: 5   Years of education: 12   Highest education level: Not on file  Occupational History   Occupation: Disabled, part-time Programmer, systems    Comment: Disability  Tobacco Use   Smoking status: Some Days    Packs/day: 0.25    Years: 23.00    Pack years: 5.75    Types: Cigarettes   Smokeless tobacco: Never   Tobacco comments:    3 to 4 per day  Vaping Use   Vaping Use: Not on file  Substance and Sexual Activity   Alcohol use: Yes    Alcohol/week: 1.0 standard drink    Types: 1 Cans of beer per week    Comment: occasionally   Drug use: Yes    Types: Marijuana    Comment: 2 times/week   Sexual activity: Yes  Other Topics Concern   Not on file  Social History Narrative   Fun: Enjoy his children   Social Determinants of Radio broadcast assistant Strain: Not on file  Food Insecurity: Not on file  Transportation Needs: Not on file  Physical Activity: Not on file  Stress: Not on file  Social Connections: Not on file  Intimate Partner Violence: Not on file    Family History:   Family History  Problem Relation Age of Onset   Hypertension Mother    Hypertension Other    Colon cancer Paternal Uncle    Stroke Maternal Aunt    Heart attack Brother    Hypertension Brother    Diabetes Maternal Aunt    Lung cancer Maternal Uncle    Stroke Maternal Uncle    Other Sister        "breathing machine at night"   Headache Sister    Thyroid disease Sister    Throat cancer Neg Hx    Pancreatic cancer Neg Hx     Esophageal cancer Neg Hx    Kidney disease Neg Hx    Liver disease Neg Hx    Family Status:  Family Status  Relation Name Status   Mother  Alive   Father  Alive   Other  (Not Specified)   Psychiatrist  (Not Specified)   Mat Aunt  (Not Specified)   Brother 3 Alive   Mat Aunt  (Not Specified)   Mat Uncle  (Not Specified)   MGM  Deceased   MGF  Deceased   PGM  Deceased   PGF  Deceased   Sister 1 Alive   Neg Hx  (Not Specified)    ROS:  Please see the history of present illness.  All other ROS reviewed and negative.     Physical Exam/Data:   Vitals:   09/02/20 0900 09/02/20 0915 09/02/20 0930 09/02/20 1000  BP: (!) 137/95 135/87 135/90 (!) 146/91  Pulse: 97 92 92 89  Resp: _0 Temp:      TempSrc:      SpO2: 97% 97% 97% 97%  Weight:      Height:       No intake or output data in the 24 hours ending 09/02/20 1148 Filed Weights   09/02/20 0043  Weight: 68.9 kg   Body mass index is 21.2  kg/m.   General: Well developed, well nourished, NAD Neck: Negative for carotid bruits. No JVD Lungs:Clear to ausculation bilaterally.No wheezes, rales, or rhonchi. Breathing is unlabored. Cardiovascular: RRR with S1 S2. + murmur Abdomen: Soft, non-tender, non-distended. No obvious abdominal masses. Extremities: No edema.  Neuro: Alert and oriented. No focal deficits. No facial asymmetry. MAE spontaneously. Psych: Responds to questions appropriately with normal affect.    EKG:  The EKG was personally reviewed and demonstrates: 09/02/20 ST with HR 104 with repolarization abnormalities and LVH.  Telemetry:  Telemetry was personally reviewed and demonstrates:  09/02/20 NSR with HR in the 80-90s  Relevant CV Studies:  Echocardiogram 05/18/20:   1. Left ventricular ejection fraction, by estimation, is 60 to 65%. The  left ventricle has normal function. The left ventricle has no regional  wall motion abnormalities. There is severe left ventricular hypertrophy.  Left ventricular  diastolic parameters   are consistent with Grade II diastolic dysfunction (pseudonormalization).  Elevated left ventricular end-diastolic pressure. The average left  ventricular global longitudinal strain is -14.2 %. The global longitudinal  strain is abnormal.   2. Right ventricular systolic function is normal. The right ventricular  size is normal. Tricuspid regurgitation signal is inadequate for assessing  PA pressure.   3. Left atrial size was severely dilated.   4. Right atrial size was moderately dilated.   5. A small pericardial effusion is present. There is no evidence of  cardiac tamponade.   6. The mitral valve is normal in structure. Trivial mitral valve  regurgitation. No evidence of mitral stenosis.   7. The aortic valve has been repaired/replaced. Aortic valve  regurgitation is not visualized. Moderate aortic valve stenosis. There is  a bioprosthetic valve present in the aortic position. Procedure Date:  2016. Aortic valve mean gradient measures 31.7  mmHg.   8. Aortic root/ascending aorta has been repaired/replaced.   9. The inferior vena cava is normal in size with greater than 50%  respiratory variability, suggesting right atrial pressure of 3 mmHg.   Coronary angiography:  Ost 1st Diag lesion is 60% stenosed.   Conclusions: Large, tortuous coronary arteries, mild to moderate disease involving the main branches.  Appearance is similar to prior catheterizations in 11/2017. Patent ostial ramus intermedius stent.   Recommendations: Medical therapy and secondary prevention. Restart clopidogrel 75 mg daily in addition to aspirin 81 mg daily. Post-cath hydration with repeat BMP tomorrow to monitor renal function following contrast exposure today.   Nelva Bush, MD  Stress test 11/25/2019:  1. Remote infarct involving the inferior wall. Left ventricle is dilated.   2. Marked hypokinesis involving the inferior wall and septum.   3. Left ventricular ejection  fraction 35%   4. Non invasive risk stratification*: High  Laboratory Data:  Chemistry Recent Labs  Lab 08/30/20 1037 09/02/20 0118  NA 139 142  K 4.6 4.4  CL 110 111  CO2 21* 22  GLUCOSE 94 120*  BUN 52* 51*  CREATININE 6.53* 6.94*  CALCIUM 8.9 8.8*  GFRNONAA 10* 9*  ANIONGAP 8 9    Total Protein  Date Value Ref Range Status  05/18/2020 6.7 6.5 - 8.1 g/dL Final  11/02/2019 7.5 6.0 - 8.5 g/dL Final   Albumin  Date Value Ref Range Status  05/18/2020 3.7 3.5 - 5.0 g/dL Final  11/02/2019 4.3 4.0 - 5.0 g/dL Final   AST  Date Value Ref Range Status  05/18/2020 16 15 - 41 U/L Final   ALT  Date Value Ref Range  Status  05/18/2020 11 0 - 44 U/L Final   Alkaline Phosphatase  Date Value Ref Range Status  05/18/2020 62 38 - 126 U/L Final   Total Bilirubin  Date Value Ref Range Status  05/18/2020 0.9 0.3 - 1.2 mg/dL Final   Bilirubin Total  Date Value Ref Range Status  11/02/2019 0.3 0.0 - 1.2 mg/dL Final   Hematology Recent Labs  Lab 09/02/20 0118  WBC 4.6  RBC 3.84*  HGB 11.8*  HCT 36.2*  MCV 94.3  MCH 30.7  MCHC 32.6  RDW 13.7  PLT 78*   Cardiac EnzymesNo results for input(s): TROPONINI in the last 168 hours. No results for input(s): TROPIPOC in the last 168 hours.  BNPNo results for input(s): BNP, PROBNP in the last 168 hours.  DDimer No results for input(s): DDIMER in the last 168 hours. TSH:  Lab Results  Component Value Date   TSH 2.804 05/17/2020   Lipids: Lab Results  Component Value Date   CHOL 119 05/17/2020   HDL 57 05/17/2020   LDLCALC 55 05/17/2020   TRIG 37 05/17/2020   CHOLHDL 2.1 05/17/2020   HgbA1c: Lab Results  Component Value Date   HGBA1C 5.4 07/23/2020    Radiology/Studies:  DG Chest 2 View  Result Date: 09/02/2020 CLINICAL DATA:  Chest pain EXAM: CHEST - 2 VIEW COMPARISON:  05/17/2020 FINDINGS: Status post median sternotomy. Previous stent graft repair of thoracic aorta. Loop recorder is noted along the left heart  border. Heart size normal. Parenchymal scarring scratch set pleural and parenchymal scarring within the left mid and left lower lung noted. No airspace consolidation identified. IMPRESSION: No acute cardiopulmonary abnormalities. Electronically Signed   By: Kerby Moors M.D.   On: 09/02/2020 02:25    Assessment and Plan:   1. Persistent chest pain with known CAD: -He has a long cardiac hx as outlined above however he reports that his most recent episode of chest pain has been worse and more intense than what he has experienced in the past. He states that he was recently sick with COVID about 2-3 weeks ago and states that he was doing well until 3 days ago when he began having very sharp chest pain that seemed worse when laying flat and with deep inspiration. He states he has been taking Tylenol which would ease the pain slightly but it would return.  -HsT found to be 85>>79>>76.  -Creatinine found to be 6.94>>follows with OP nephrology with plans to start HD in the next two weeks.  -EKG with no acute ST changes, LVH and repolarization abnormalities -CXR with no cardiac abnormalities. -Hx: LHC fpr NSTEMI 11/2017 with showed 50% proximal LCx, 90% ramus => DES to ramus. Last LHC 09/2018 with non obstructive CAD and patent ramus stent  -Stress test 11/2019 with no ischemic, remote infarct  -Low suspicion for ACS however with recent COVID along with reproducible symptoms and pleuritic pain there is concern for pericarditis/myocarditis -Obtain CRP, sed rate -Repeat echo to reassess small pericardial effusion previously present in 05/2020.  -Continue carvedilol, ASA  2. Cardiomyopathy with chronic combined systolic and diastolic CHF -Initial echocardiogram from 2013 with LVEF at 35-40%>>>normalized by 2016 with EF at 55-60% however reduced again in 11/2017 to 35-40% and most recent echo 05/2020 with normalized EF at 60-65% with G2DD -Appears euvolemic on exam  -GDMT limited due to CKD   3. S/p AVR: -Echo  in 3/13 showed moderate to severe eccentric MR and mild to moderate AI with prolapsing left coronary  cusp which was followed for several years>>now s/p Bentall procedure with bioprosthetic aortic valve 9/16 at [facility]   4. 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 Hx of CVA: -Has loop implantation  -Continue Repatha, statin    6. GERD: -Continue Protonix   7. HTN: -Elevated, 126/92>>151/95>>147/97 -Continue PTA amlodipine, carvedilol, hydralazine, clonidine, doxazosin   For questions or updates, please contact [place] Please consult [email or web address] for contact info under Cardiology/STEMI.   Lyndel Safe NP-C [facility] Pager: [phone] 09/02/2020 11:48 AM    Patient seen and examined. Agree with assessment and plan.  [name] is a 50 year old African-American male who has a complicated medical history and suffered a non-STEMI in September 2019 and underwent PCI to his ramus immediate vessel.  The patient has aggressive stage V renal disease and is approaching dialysis.  He is status post abdominal aortic aneurysm repair in 2016, with subsequent open repair secondary to chronic dissection with multibranch Dacron grafting in 2018.  He status post aortic valve replacement with Bentall procedure performed at [facility].  He has been followed by Dr. Aundra Dubin.  Several weeks ago he tested positive for COVID.  He was unvaccinated.  He subsequently tested negative last week.  He has had chronic chest pain and has been felt to have GERD.  An echo Doppler study in March 2022 for chest pain showed preserved LV function with EF 60 to 03%, grade 2 diastolic dysfunction, abnormal global longitudinal strain, severe left atrial dilatation with moderate right atrial dilatation, and a bioprosthetic aortic valve with moderate aortic stenosis with a mean  gradient of 31.7 mm.  He was also noted to have a small pericardial effusion.  Recently, the patient has continued to experience chest pain.  His chest pain is not exertional.  He often notes chest pain being worse with lying supine and also when taking a deep breath.  It does improve somewhat with sitting up.  On exam he is in no acute distress.  Heart rate is elevated at 95 to 105 bpm.  Blood pressure earlier was 146/91.  JVD is mildly increased.  His lungs are clear.  Rhythm was regular.  There was a 2- 3/6 systolic murmur I could not appreciate a pericardial friction rub.  He had lower sternal and left chest wall/upper abdominal tenderness.  There is no edema.  ECG shows tachycardia 104 bpm.  LVH with significant repolarization changes.  High-sensitivity troponins are minimally elevated at 76, 79, 85, but improved from 3 months previously when troponins ranged from 150-201.  Creatinine is further increased to 6.94 today.  Hemoglobin 11.8/hematocrit 36.2 with thrombocytopenia with a platelet count of 78,000.  Potassium 4.4.  The patient's chest pain does not sound ischemic in etiology and seems more consistent with possible pleuritic chest pain.  We will repeat 2D echo Doppler study for reassessment of systolic and diastolic function, and previously noted small pericardial effusion.  The patient's cardiac murmur is consistent with his bioprosthetic valve with moderate stenosis.  Will check erythrocyte sedimentation rate, CRP, and closely follow electrolytes and CBC.  With his recent COVID positivity and no prior vaccinations may need to assess for possible myocarditis with cardiac MRI but we will not schedule this presently.  We will follow.   [name] Sine, MD, [facility] 09/02/2020 2:03 PM

## 2020-09-02 NOTE — ED Provider Notes (Signed)
Emergency Medicine Provider Triage Evaluation Note  DESHAWN WITTY , a 50 y.o. male  was evaluated in triage.  Pt complains of CP x 3 days.  Hx of aortic valve replacement.  Worsening pain.  Radiates to left arm.  Denies treatment PTA.  Review of Systems  Positive: CP, arm pain Negative: Fever, cough  Physical Exam  BP (!) 143/91 (BP Location: Right Arm)   Pulse (!) 106   Temp 98.8 F (37.1 C) (Oral)   Resp 18   Ht 5\' 11"  (1.803 m)   Wt 68.9 kg   SpO2 100%   BMI 21.20 kg/m  Gen:   Awake, no distress   Resp:  Normal effort  MSK:   Moves extremities without difficulty  Other:  Aortic murmur  Medical Decision Making  Medically screening exam initiated at 1:08 AM.  Appropriate orders placed.  AMAREON PHUNG was informed that the remainder of the evaluation will be completed by another provider, this initial triage assessment does not replace that evaluation, and the importance of remaining in the ED until their evaluation is complete.  CP.  Requested next available bed from charge RN.   Montine Circle, PA-C 09/02/20 0112    Palumbo, April, MD 09/02/20 [id number]

## 2020-09-02 NOTE — ED Triage Notes (Signed)
Patient reports L sided chest pain x 3 days radiating down his L arm, hx of MI with stent placement, denies N/V/D

## 2020-09-02 NOTE — ED Notes (Signed)
1345 medications not yet verified by pharmacy.

## 2020-09-02 NOTE — ED Notes (Signed)
Dr Celesta Gentile notified of patient c/o returning left chest pain with HA.

## 2020-09-02 NOTE — ED Provider Notes (Signed)
Received in signout from Dr. Christy Gentles.  Patient with a complicated medical history including prior MI and aortic disease comes in with a chief complaints of chest discomfort.  Plan for eval by the morning cardiology team. Cards seen recommends med admit.    Deno Etienne, DO 09/02/20 1506

## 2020-09-02 NOTE — ED Notes (Signed)
Report attempted rn to attempt again  

## 2020-09-02 NOTE — ED Notes (Signed)
Patient's spouse called and was updated on patient status with following his verbal permission.

## 2020-09-03 ENCOUNTER — Observation Stay (HOSPITAL_BASED_OUTPATIENT_CLINIC_OR_DEPARTMENT_OTHER): Payer: Medicare HMO

## 2020-09-03 DIAGNOSIS — Z952 Presence of prosthetic heart valve: Secondary | ICD-10-CM | POA: Diagnosis not present

## 2020-09-03 DIAGNOSIS — R079 Chest pain, unspecified: Secondary | ICD-10-CM

## 2020-09-03 DIAGNOSIS — I350 Nonrheumatic aortic (valve) stenosis: Secondary | ICD-10-CM

## 2020-09-03 DIAGNOSIS — N185 Chronic kidney disease, stage 5: Secondary | ICD-10-CM | POA: Diagnosis not present

## 2020-09-03 DIAGNOSIS — I517 Cardiomegaly: Secondary | ICD-10-CM | POA: Diagnosis not present

## 2020-09-03 LAB — ECHOCARDIOGRAM COMPLETE
AR max vel: 1.19 cm2
AV Area VTI: 1.3 cm2
AV Area mean vel: 1.16 cm2
AV Mean grad: 33.5 mmHg
AV Peak grad: 55.2 mmHg
Ao pk vel: 3.72 m/s
Area-P 1/2: 4.21 cm2
Height: 71 in
S' Lateral: 2.7 cm
Weight: 2432 oz

## 2020-09-03 MED ORDER — PROCHLORPERAZINE EDISYLATE 10 MG/2ML IJ SOLN
10.0000 mg | Freq: Four times a day (QID) | INTRAMUSCULAR | Status: DC | PRN
  Administered 2020-09-04: 10 mg via INTRAVENOUS
  Filled 2020-09-03 (×2): qty 2

## 2020-09-03 MED ORDER — OXYCODONE HCL 5 MG PO TABS
5.0000 mg | ORAL_TABLET | Freq: Four times a day (QID) | ORAL | Status: DC | PRN
  Administered 2020-09-03 – 2020-09-07 (×9): 5 mg via ORAL
  Filled 2020-09-03 (×10): qty 1

## 2020-09-03 MED ORDER — HYDROMORPHONE HCL 1 MG/ML IJ SOLN
0.5000 mg | INTRAMUSCULAR | Status: DC | PRN
  Administered 2020-09-03: 0.5 mg via INTRAVENOUS
  Filled 2020-09-03: qty 1

## 2020-09-03 MED ORDER — CARVEDILOL 25 MG PO TABS
37.5000 mg | ORAL_TABLET | Freq: Two times a day (BID) | ORAL | Status: DC
  Administered 2020-09-03 – 2020-09-04 (×2): 37.5 mg via ORAL
  Filled 2020-09-03 (×2): qty 1

## 2020-09-03 NOTE — Progress Notes (Addendum)
Progress Note  Patient Name: [name] Date of Encounter: 09/03/2020  [facility] Cardiologist: None   Subjective   Still with ongoing chest pain, concerned. Lots of questions. Unhappy with care overnight. Seemed better after discussion this morning.   Inpatient Medications    Scheduled Meds:  amLODipine  10 mg Oral Daily   arformoterol  15 mcg Nebulization BID   And   umeclidinium bromide  1 puff Inhalation Daily   aspirin  81 mg Oral Daily   atorvastatin  80 mg Oral Daily   carvedilol  25 mg Oral BID WC   cloNIDine  0.3 mg Transdermal Weekly   doxazosin  16 mg Oral QHS   heparin  5,000 Units Subcutaneous Q12H   hydrALAZINE  75 mg Oral TID   lidocaine  1 patch Transdermal Q24H   minoxidil  20 mg Oral Daily   pantoprazole  40 mg Oral Daily   Continuous Infusions:  PRN Meds: acetaminophen, albuterol, HYDROmorphone (DILAUDID) injection, nitroGLYCERIN, ondansetron (ZOFRAN) IV   Vital Signs    Vitals:   09/03/20 0804 09/03/20 0833 09/03/20 1033 09/03/20 1034  BP:  (!) 146/73 (!) 155/98 (!) 155/98  Pulse:  96  90  Resp:      Temp:  98.7 F (37.1 C)    TempSrc:      SpO2: 98% 96%  93%  Weight:      Height:        Intake/Output Summary (Last 24 hours) at 09/03/2020 1047 Last data filed at 09/03/2020 0400 Gross per 24 hour  Intake --  Output 1150 ml  Net -1150 ml   Last 3 Weights 09/02/2020 08/30/2020 07/23/2020  Weight (lbs) 152 lb 152 lb 12.8 oz 158 lb 12.8 oz  Weight (kg) 68.947 kg 69.31 kg 72.031 kg      Telemetry    SR - Personally Reviewed  ECG    SR, 98 bpm LVH with repol - Personally Reviewed  Physical Exam   GEN: No acute distress.   Neck: No JVD Cardiac: RRR, + systolic murmur, no rubs, or gallops.  Respiratory: Clear to auscultation bilaterally. GI: Soft, nontender, non-distended  MS: No edema; No deformity. Neuro:  Nonfocal  Psych: Normal affect   Labs    High Sensitivity Troponin:   Recent Labs  Lab 09/02/20 0118  09/02/20 0308 09/02/20 0840  TROPONINIHS 85* 79* 76*      Chemistry Recent Labs  Lab 08/30/20 1037 09/02/20 0118  NA 139 142  K 4.6 4.4  CL 110 111  CO2 21* 22  GLUCOSE 94 120*  BUN 52* 51*  CREATININE 6.53* 6.94*  CALCIUM 8.9 8.8*  GFRNONAA 10* 9*  ANIONGAP 8 9     Hematology Recent Labs  Lab 09/02/20 0118  WBC 4.6  RBC 3.84*  HGB 11.8*  HCT 36.2*  MCV 94.3  MCH 30.7  MCHC 32.6  RDW 13.7  PLT 78*   Lipid Panel     Component Value Date/Time   CHOL 119 05/17/2020 2141   CHOL 143 10/09/2019 1029   TRIG 37 05/17/2020 2141   HDL 57 05/17/2020 2141   HDL 58 10/09/2019 1029   CHOLHDL 2.1 05/17/2020 2141   VLDL 7 05/17/2020 2141   LDLCALC 55 05/17/2020 2141   LDLCALC 66 10/09/2019 1029   LABVLDL 19 10/09/2019 1029     BNPNo results for input(s): BNP, PROBNP in the last 168 hours.   DDimer No results for input(s): DDIMER in the last 168 hours.  Radiology    DG Chest 2 View  Result Date: 09/02/2020 CLINICAL DATA:  Chest pain EXAM: CHEST - 2 VIEW COMPARISON:  05/17/2020 FINDINGS: Status post median sternotomy. Previous stent graft repair of thoracic aorta. Loop recorder is noted along the left heart border. Heart size normal. Parenchymal scarring scratch set pleural and parenchymal scarring within the left mid and left lower lung noted. No airspace consolidation identified. IMPRESSION: No acute cardiopulmonary abnormalities. Electronically Signed   By: Kerby Moors M.D.   On: 09/02/2020 02:25    Cardiac Studies   Echo: pending  Patient Profile     50 y.o. 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 Christy Gentles.  Assessment & Plan    Persistent chest pain with known CAD: He  has a long cardiac hx as outlined above however he reports that his most recent episode of chest pain has been worse and more intense than what he has experienced in the past. He states that he was recently sick with COVID about 2-3 weeks ago and states that he was doing well until 3 days ago when he began having very sharp chest pain that seemed worse when laying flat and with deep inspiration. --HsT found to be 85>>79>>76, in the setting of CKD stage IV/V -- EKG with no acute ST changes, LVH and repolarization abnormalities -- initial concern for pericarditis/myocarditis, but inflammatory markers are normal therefore not sure of utility cardiac MRI -- echo pending read   2. 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 S/p AVR: Echo in 3/13 showed moderate to severe eccentric MR and mild to moderate AI with prolapsing left coronary cusp which was followed for several years>>now s/p Bentall procedure with bioprosthetic aortic valve 9/16 at [facility] -- echo pending   4. 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 Hx of CVA: Has loop implantation -- Continue Repatha, statin     6. GERD: -- Continue Protonix    7. HTN: blood pressures are labile  -- Continue PTA amlodipine, carvedilol, hydralazine, clonidine, doxazosin  For questions or updates, please contact [place] Please consult [email or web address] for contact info under     Signed, Reino Bellis, NP  09/03/2020, 10:47 AM       Patient seen and examined. Agree with assessment and plan.  Patient continues to have some of his pleuritic-like chest pain.  Remotely he states he had experienced more significant but somewhat similar type of pain when he had a dissection.  Pulmonary review reveals severe left ventricular hypertrophy with normal systolic function.  There is not appear to be any significant effusion.  Await formal analysis and report.  Continues to be mildly elevated with  resting pulse in the mid 90s.  We will further titrate carvedilol to 37.5 mg twice daily but may need further titration to 50 mg twice a day.  Monitor blood pressure.  May need to further titrate hydralazine pending 6.94 today with future dialysis imminent followed by Dr. Joelyn Oms.  Inflammatory markers are negative with CRP 0.6; ESR 6.     [name] Sine, MD, [facility] 09/03/2020 11:20 AM

## 2020-09-03 NOTE — Progress Notes (Addendum)
    Echo today with speckled appearance, concern for amyloid. He has been followed in the AHF and had a PYP scan in 2020 which was negative. Has been unable to undergo cardiac MRI with worsening renal function. Brief curbside with HF, will order urine immunofixation. Not sure indication for further testing at this point as he has undergone PYP scan. It was felt LVH related to ETOH use and poorly controlled HTN. Further discuss with MD regarding work up. Patient updated on plan.  SignedReino Bellis, NP-C 09/03/2020, 3:48 PM

## 2020-09-03 NOTE — Progress Notes (Signed)
PROGRESS NOTE  [name]  ZDG:[id number] DOB: 1971-03-10 DOA: 09/02/2020 PCP: Kerin Perna, NP   Brief Narrative: [name] is a 50 y.o. 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   Patient has had multiple episodes of chest pain this year, all these episodes always considered not ACS etiology.  This time, patient started to have left-sided chest pains, dull to pressure-like episodic, each episode last for 5 to 10 minutes, worsening with upper torso movement and left arm movement, relieved with sitting down.  Denied any associated symptoms such as feeling nauseous vomiting, palpitations or sweating.  She has been taking around-the-clock Tylenol to prevent this episode from happening however he still has 5/10 pain this morning, and came to ED.   Telemetry is cardiology increased hydralazine dosage, he used to take Imdur which was removed.  He still taking clonidine patch and he was told by his nephrology need to start hemodialysis in the next 7-month   ED Course: trop 85>79>76, EKG showed LVH with secondary changes chronic.  Blood pressure mildly elevated, done fairly controlled with resuming p.o. BP meds at home.  Assessment & Plan: Active Problems:   Chest pain, rule out acute myocardial infarction   Chest pain  Chest pain: Not typical of ACS with no ischemic ECG changes, though he has known CAD. Mild troponin elevation especially in light of severe renal impairment. This may be more flaring of chronic chest pain he associates with open heart surgery for MVR/AAA Tx.  - Pericarditis considered, will follow up echocardiogram, though CRP and ESR are wnl.  - Lidocaine patch, tylenol prn.   - Further work up to be determined per cardiology.    Chronic combined HFrEF: LVEF 2013 was 35-40% and has been up and down since, most recently normalized to 60-65% with G2DD in March 2022.  - Per cardiology  Resistant HTN:  - Continue clonidine patch, amlodipine, carvedilol, hydralazine, doxazosin. May augment dosing based on cardiology discussion.   -Continue current regimen, including clonidine patch, consider increase hydralazine dosage if indicated.  X2 and Imdur if indicated.  Will monitor and decide.   CKD stage V: No indications for acute initiation of hemodialysis at this time. Will continue outpatient nephrology follow up for timing of HD initiation.   Chronic systolic CHF -Euvolemic, no indication for diuresis.   Biosynthetic AVR and mitral valve - Appears to be stable, f/u echo.    HLD - Outpatient Repatha   History of CVA: s/p loop recorder.  - Follow up with cardiology for loop recorder management - Continue statin, repatha  Chronic thrombocytopenia - Stable, monitoring.  GERD:  - PPI  DVT prophylaxis: Heparin Code Status: Full Family Communication: None at bedside Disposition Plan:  Status is: Observation - it is unclear whether cardiology wishes for this patient to remain inpatient for further work up and/or monitoring or if discharge is recommended today.  Dispo: The patient is from: Home              Anticipated d/c is to: Home          Consultants:  Cardiology  Procedures:  None  Antimicrobials: None   Subjective: Chest discomfort starts in left lower chest, radiates to sternum,  upward, then to left shoulder and down the arm. It's sharp and similar to, but worse than, chronic pain since 2016. He is irritated I'm asking redundant questions from prior medical staff. Keeps stating no one is telling him anything, though appears to be satisfied after our conversation.  Objective: Vitals:   09/03/20 0833 09/03/20 1033 09/03/20 1034 09/03/20 1320   BP: (!) 146/73 (!) 155/98 (!) 155/98 103/62  Pulse: 96  90 91  Resp:    18  Temp: 98.7 F (37.1 C)   97.8 F (36.6 C)  TempSrc:    Oral  SpO2: 96%  93% 96%  Weight:      Height:        Intake/Output Summary (Last 24 hours) at 09/03/2020 1444 Last data filed at 09/03/2020 0400 Gross per 24 hour  Intake --  Output 1150 ml  Net -1150 ml   Filed Weights   09/02/20 0043  Weight: 68.9 kg    Gen: 50 y.o. male in no distress  Pulm: Non-labored breathing room air. Clear to auscultation bilaterally.  CV: Regular rate and rhythm. No murmur, rub, or gallop. No JVD, no pitting pedal edema. Left chest wall is tender to palpation. GI: Abdomen soft, non-tender, non-distended, with normoactive bowel sounds. No organomegaly or masses felt. Ext: Warm, no deformities Skin: No wounds on visualized skin Neuro: Alert and oriented. No focal neurological deficits. Psych: Judgement and insight appear normal. Mood & affect appropriate.   Data Reviewed: I have personally reviewed following labs and imaging studies  CBC: Recent Labs  Lab 09/02/20 0118  WBC 4.6  HGB 11.8*  HCT 36.2*  MCV 94.3  PLT 78*   Basic Metabolic Panel: Recent Labs  Lab 08/30/20 1037 09/02/20 0118  NA 139 142  K 4.6 4.4  CL 110 111  CO2 21* 22  GLUCOSE 94 120*  BUN 52* 51*  CREATININE 6.53* 6.94*  CALCIUM 8.9 8.8*   GFR: Estimated Creatinine Clearance: 12.7 mL/min (A) (by C-G formula based on SCr of 6.94 mg/dL (H)).   Recent Results (from the past 240 hour(s))  Resp Panel by RT-PCR (Flu A&B, Covid) Nasopharyngeal Swab     Status: None   Collection Time: 09/02/20  7:33 PM   Specimen: Nasopharyngeal Swab; Nasopharyngeal(NP) swabs in vial transport medium  Result Value Ref Range Status   SARS Coronavirus 2 by RT PCR NEGATIVE NEGATIVE Final    Comment: (NOTE) SARS-CoV-2 target nucleic acids are NOT DETECTED.  The SARS-CoV-2 RNA is generally detectable in upper respiratory specimens during the acute phase  of infection. The lowest concentration of SARS-CoV-2 viral copies this assay can detect is 138 copies/mL. A negative result does not preclude SARS-Cov-2 infection and should not be used as the sole basis for treatment or other patient management decisions. A negative result may occur with  improper specimen collection/handling, submission of specimen other than nasopharyngeal swab, presence of viral mutation(s) within the areas targeted by this assay, and inadequate number of viral copies(<138 copies/mL). A negative result must be combined with clinical observations, patient history, and epidemiological information. The expected result is Negative.  Fact Sheet for Patients:  EntrepreneurPulse.com.au  Fact Sheet for Healthcare Providers:  IncredibleEmployment.be  This test is no t yet approved or cleared by the [place] FDA and  has been authorized for detection and/or diagnosis of SARS-CoV-2 by FDA under an Emergency Use Authorization (EUA). This EUA will remain  in effect (meaning this test can be used) for the duration of the COVID-19  declaration under Section 564(b)(1) of the Act, 21 U.S.C.section 360bbb-3(b)(1), unless the authorization is terminated  or revoked sooner.       Influenza A by PCR NEGATIVE NEGATIVE Final   Influenza B by PCR NEGATIVE NEGATIVE Final    Comment: (NOTE) The Xpert Xpress SARS-CoV-2/FLU/RSV plus assay is intended as an aid in the diagnosis of influenza from Nasopharyngeal swab specimens and should not be used as a sole basis for treatment. Nasal washings and aspirates are unacceptable for Xpert Xpress SARS-CoV-2/FLU/RSV testing.  Fact Sheet for Patients: EntrepreneurPulse.com.au  Fact Sheet for Healthcare Providers: IncredibleEmployment.be  This test is not yet approved or cleared by the [place] FDA and has been authorized for detection and/or diagnosis of  SARS-CoV-2 by FDA under an Emergency Use Authorization (EUA). This EUA will remain in effect (meaning this test can be used) for the duration of the COVID-19 declaration under Section 564(b)(1) of the Act, 21 U.S.C. section 360bbb-3(b)(1), unless the authorization is terminated or revoked.  Performed at [facility], [place] [address] [place], [place] [address]       Radiology Studies: DG Chest 2 View  Result Date: 09/02/2020 CLINICAL DATA:  Chest pain EXAM: CHEST - 2 VIEW COMPARISON:  05/17/2020 FINDINGS: Status post median sternotomy. Previous stent graft repair of thoracic aorta. Loop recorder is noted along the left heart border. Heart size normal. Parenchymal scarring scratch set pleural and parenchymal scarring within the left mid and left lower lung noted. No airspace consolidation identified. IMPRESSION: No acute cardiopulmonary abnormalities. Electronically Signed   By: Kerby Moors M.D.   On: 09/02/2020 02:25   ECHOCARDIOGRAM COMPLETE  Result Date: 09/03/2020    ECHOCARDIOGRAM REPORT   Patient Name:   SHAMARION COOTS Date of Exam: 09/03/2020 Medical Rec #:  [id number]     Height:       71.0 in Accession #:    [id number]    Weight:       152.0 lb Date of Birth:  17-Apr-1970      BSA:          1.876 m Patient Age:    72 years      BP:           146/73 mmHg Patient Gender: M             HR:           94 bpm. Exam Location:  Inpatient Procedure: Cardiac Doppler and Color Doppler Indications:    R07.9* Chest pain, unspecified  History:        Patient has prior history of Echocardiogram examinations, most                 recent 05/18/2020. Descending thoraciic disection repaired; AVR                 2006. Patient states he has had chest pain for six years since                 his surgery but this is different.                 Aortic Valve: bioprosthetic valve is present in the aortic                 position.  Sonographer:    Merrie Roof RDCS Referring Phys: [id number] [place]  1.  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 S/P AVR with elevated mean gradient of 33.5 mmHg (similar to gradient  of 31 mmHg on 05/18/20 study).  2. Left ventricular ejection fraction, by estimation, is >75%. The left ventricle has hyperdynamic function. The left ventricle has no regional wall motion abnormalities. Left ventricular diastolic parameters are consistent with Grade I diastolic dysfunction (impaired relaxation). Elevated left atrial pressure.  3. Right ventricular systolic function is normal. The right ventricular size is normal.  4. Left atrial size was moderately dilated.  5. Right atrial size was moderately dilated.  6. The mitral valve is normal in structure. Trivial mitral valve regurgitation. No evidence of mitral stenosis.  7. The aortic valve has been repaired/replaced. Aortic valve regurgitation is not visualized. Moderate aortic valve stenosis. There is a bioprosthetic valve present in the aortic position. FINDINGS  Left Ventricle: Left ventricular ejection fraction, by estimation, is >75%. The left ventricle has hyperdynamic function. The left ventricle has no regional wall motion abnormalities. The left ventricular internal cavity size was normal in size. There is no left ventricular hypertrophy. Left ventricular diastolic parameters are consistent with Grade I diastolic dysfunction (impaired relaxation). Elevated left atrial pressure. Right Ventricle: The right ventricular size is normal. Right ventricular systolic function is normal. Left Atrium: Left atrial size was moderately dilated. Right Atrium: Right atrial size was moderately dilated. Pericardium: There is no evidence of pericardial effusion. Mitral Valve: The mitral valve is normal in structure. Mild mitral annular calcification. Trivial mitral valve regurgitation. No evidence of mitral valve stenosis. Tricuspid Valve: The tricuspid valve is normal in structure. Tricuspid valve regurgitation is trivial. No evidence of  tricuspid stenosis. Aortic Valve: The aortic valve has been repaired/replaced. Aortic valve regurgitation is not visualized. Moderate aortic stenosis is present. Aortic valve mean gradient measures 33.5 mmHg. Aortic valve peak gradient measures 55.2 mmHg. Aortic valve area,  by VTI measures 1.30 cm. There is a bioprosthetic valve present in the aortic position. Pulmonic Valve: The pulmonic valve was normal in structure. Pulmonic valve regurgitation is trivial. No evidence of pulmonic stenosis. Aorta: The aortic root is normal in size and structure. Venous: The inferior vena cava was not well visualized. IAS/Shunts: No atrial level shunt detected by color flow Doppler. 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 S/P AVR with elevated mean gradient of 33.5 mmHg (similar to gradient of 31 mmHg on 05/18/20 study).  LEFT VENTRICLE PLAX 2D LVIDd:         4.30 cm  Diastology LVIDs:         2.70 cm  LV e' medial:    3.59 cm/s LV PW:         1.90 cm  LV E/e' medial:  25.4 LV IVS:        1.80 cm  LV e' lateral:   6.09 cm/s LVOT diam:     2.10 cm  LV E/e' lateral: 15.0 LV SV:         79 LV SV Index:   42 LVOT Area:     3.46 cm  RIGHT VENTRICLE RV Basal diam:  4.00 cm LEFT ATRIUM             Index       RIGHT ATRIUM           Index LA diam:        4.50 cm 2.40 cm/m  RA Area:     22.50 cm LA Vol (A2C):   83.1 ml 44.29 ml/m RA Volume:   73.30 ml  39.06 ml/m LA Vol (A4C):   60.3 ml 32.14 ml/m LA Biplane Vol: 75.4  ml 40.18 ml/m  AORTIC VALVE AV Area (Vmax):    1.19 cm AV Area (Vmean):   1.16 cm AV Area (VTI):     1.30 cm AV Vmax:           371.50 cm/s AV Vmean:          273.500 cm/s AV VTI:            0.610 m AV Peak Grad:      55.2 mmHg AV Mean Grad:      33.5 mmHg LVOT Vmax:         128.00 cm/s LVOT Vmean:        91.700 cm/s LVOT VTI:          0.229 m LVOT/AV VTI ratio: 0.38  AORTA Ao Root diam: 3.70 cm Ao Asc diam:  3.70 cm MITRAL VALVE MV Area (PHT): 4.21 cm     SHUNTS MV Decel Time:  180 msec     Systemic VTI:  0.23 m MV E velocity: 91.20 cm/s   Systemic Diam: 2.10 cm MV A velocity: 133.00 cm/s MV E/A ratio:  0.69 Kirk Ruths MD Electronically signed by Kirk Ruths MD Signature Date/Time: 09/03/2020/12:15:17 PM    Final     Scheduled Meds:  amLODipine  10 mg Oral Daily   arformoterol  15 mcg Nebulization BID   And   umeclidinium bromide  1 puff Inhalation Daily   aspirin  81 mg Oral Daily   atorvastatin  80 mg Oral Daily   carvedilol  37.5 mg Oral BID WC   cloNIDine  0.3 mg Transdermal Weekly   doxazosin  16 mg Oral QHS   heparin  5,000 Units Subcutaneous Q12H   hydrALAZINE  75 mg Oral TID   lidocaine  1 patch Transdermal Q24H   minoxidil  20 mg Oral Daily   pantoprazole  40 mg Oral Daily   Continuous Infusions:   LOS: 0 days   Time spent: 25 minutes.  Patrecia Pour, MD [facility] [email or web address] 09/03/2020, 2:44 PM

## 2020-09-04 DIAGNOSIS — I132 Hypertensive heart and chronic kidney disease with heart failure and with stage 5 chronic kidney disease, or end stage renal disease: Secondary | ICD-10-CM | POA: Diagnosis not present

## 2020-09-04 DIAGNOSIS — I517 Cardiomegaly: Secondary | ICD-10-CM | POA: Diagnosis not present

## 2020-09-04 DIAGNOSIS — Z952 Presence of prosthetic heart valve: Secondary | ICD-10-CM | POA: Diagnosis not present

## 2020-09-04 DIAGNOSIS — N185 Chronic kidney disease, stage 5: Secondary | ICD-10-CM | POA: Diagnosis not present

## 2020-09-04 DIAGNOSIS — Z20822 Contact with and (suspected) exposure to covid-19: Secondary | ICD-10-CM | POA: Diagnosis not present

## 2020-09-04 DIAGNOSIS — R079 Chest pain, unspecified: Secondary | ICD-10-CM

## 2020-09-04 DIAGNOSIS — Z8616 Personal history of COVID-19: Secondary | ICD-10-CM | POA: Diagnosis not present

## 2020-09-04 DIAGNOSIS — N186 End stage renal disease: Secondary | ICD-10-CM | POA: Diagnosis not present

## 2020-09-04 LAB — IMMUNOFIXATION, URINE

## 2020-09-04 MED ORDER — CARVEDILOL 25 MG PO TABS
50.0000 mg | ORAL_TABLET | Freq: Two times a day (BID) | ORAL | Status: DC
  Administered 2020-09-04 – 2020-09-07 (×6): 50 mg via ORAL
  Filled 2020-09-04 (×7): qty 2

## 2020-09-04 MED ORDER — NITROGLYCERIN 0.4 MG SL SUBL
SUBLINGUAL_TABLET | SUBLINGUAL | Status: AC
  Administered 2020-09-04: 0.4 mg via SUBLINGUAL
  Filled 2020-09-04: qty 3

## 2020-09-04 MED ORDER — ISOSORBIDE MONONITRATE ER 30 MG PO TB24
15.0000 mg | ORAL_TABLET | Freq: Every day | ORAL | Status: DC
  Administered 2020-09-04 – 2020-09-05 (×2): 15 mg via ORAL
  Filled 2020-09-04 (×2): qty 1

## 2020-09-04 MED ORDER — NITROGLYCERIN 0.4 MG SL SUBL
0.4000 mg | SUBLINGUAL_TABLET | SUBLINGUAL | Status: DC | PRN
  Administered 2020-09-04 – 2020-09-10 (×8): 0.4 mg via SUBLINGUAL
  Filled 2020-09-04 (×4): qty 1

## 2020-09-04 NOTE — Progress Notes (Signed)
Cross-coverage note:    Called regarding patient's chest pain. He woke this morning with recurrent chest pain.    He was admitted with sharp non-exertional chest pain, worse with lying supine, had mild troponin elevation, no acute EKG changes, normal ESR and CRP, and echo yesterday without wall motion abnormalities or pericardial effusion.   EKG reviewed and not significantly changed. Low-suspicion for ACS. Medications being adjusted by cardiology. Plan to continue pain-control, medication titration per cardiology.

## 2020-09-04 NOTE — Progress Notes (Signed)
PROGRESS NOTE  [name]  WFU:[id number] DOB: June 11, 1970 DOA: 09/02/2020 PCP: Kerin Perna, NP   Brief Narrative: [name] is a 50 y.o. 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   Patient has had multiple episodes of chest pain this year, all these episodes always considered not ACS etiology.  This time, patient started to have left-sided chest pains, dull to pressure-like episodic, each episode last for 5 to 10 minutes, worsening with upper torso movement and left arm movement, relieved with sitting down.  Denied any associated symptoms such as feeling nauseous vomiting, palpitations or sweating.  She has been taking around-the-clock Tylenol to prevent this episode from happening however he still has 5/10 pain this morning, and came to ED.   Telemetry is cardiology increased hydralazine dosage, he used to take Imdur which was removed.  He still taking clonidine patch and he was told by his nephrology need to start hemodialysis in the next 12-month.   ED Course: trop 85>79>76, EKG showed LVH with secondary changes chronic.  Blood pressure mildly elevated, done fairly controlled with resuming p.o. BP meds at home.  Assessment & Plan: Active Problems:   Chest pain, rule out acute myocardial infarction   Chest pain  Chest pain: Not typical of ACS with no ischemic ECG changes, though he has known CAD. Mild troponin elevation especially in light of severe renal impairment. This may be more flaring of chronic chest pain he associates with open heart surgery for MVR/AAA Tx.  - Responsive to nitro, not narcotics > cardiology adding low dose isosorbide. - Continue ASA, statin, beta blocker -  Lidocaine patch, tylenol prn.  - Further work up to be determined per cardiology.    Chronic combined HFrEF: LVEF 2013 was 35-40% and has been up and down since, most recently normalized to 60-65% with G2DD in March 2022.  - Echo this admission with hyperdynamic LVEF >75%, G1DD, moderate biatrial enlargement, no pericardial effusion.  - Echo with speckled LVH > renal impairment not yet on HD precludes contrast (MRI or CT). Immunofixation in process. - Per cardiology  Resistant HTN:  - Continue clonidine patch, minoxidil, amlodipine, carvedilol, hydralazine, doxazosin. Coreg titrated upward per cardiology. Adding low dose isosorbide today, will monitor for HA (remote hx of this in the past).    CKD stage V: No indications for acute initiation of hemodialysis at this time. Will continue outpatient nephrology follow up for timing of HD initiation.   Chronic systolic CHF -Euvolemic, no indication for diuresis.   Biosynthetic AVR and mitral valve - Appears to be stable, f/u echo.    HLD - Outpatient Repatha   History of CVA: s/p loop recorder.  - Follow up with cardiology for loop recorder management - Continue statin, repatha  Chronic thrombocytopenia - Stable, monitoring.  COPD: Quiescent.  - continue BDs.   GERD:  - PPI  DVT prophylaxis: Heparin Rossiter Code Status: Full Family Communication: None at bedside Disposition Plan:  Status is: Observation. Disposition timing per cardiology.   Dispo: The patient is from: Home              Anticipated d/c is to: Home          Consultants:  Cardiology  Procedures:  None  Antimicrobials: None   Subjective: Recurrent chest pain overnight improved with NTG. No dyspnea currently. Was put on supplemental oxygen.   Objective: Vitals:   09/04/20 0900 09/04/20 0904 09/04/20 0909 09/04/20 1229  BP: 140/77 117/65 121/70 (!) 88/53  Pulse: 93  100 75  Resp:    20  Temp:      TempSrc:      SpO2: 100%  100% 100%  Weight:       Height:        Intake/Output Summary (Last 24 hours) at 09/04/2020 1457 Last data filed at 09/03/2020 1700 Gross per 24 hour  Intake --  Output 400 ml  Net -400 ml   Filed Weights   09/02/20 0043  Weight: 68.9 kg   Gen: 50 y.o. male in no distress Pulm: Nonlabored breathing, clear. CV: Regular rate and rhythm. No murmur, rub, or gallop. No JVD, no dependent edema. GI: Abdomen soft, non-tender, non-distended, with normoactive bowel sounds.  Ext: Warm, no deformities Skin: No rashes, lesions or ulcers on visualized skin. Neuro: Alert and oriented. No focal neurological deficits. Psych: Judgement and insight appear fair. Mood euthymic & affect congruent. Behavior is appropriate.    Data Reviewed: I have personally reviewed following labs and imaging studies  CBC: Recent Labs  Lab 09/02/20 0118  WBC 4.6  HGB 11.8*  HCT 36.2*  MCV 94.3  PLT 78*   Basic Metabolic Panel: Recent Labs  Lab 08/30/20 1037 09/02/20 0118  NA 139 142  K 4.6 4.4  CL 110 111  CO2 21* 22  GLUCOSE 94 120*  BUN 52* 51*  CREATININE 6.53* 6.94*  CALCIUM 8.9 8.8*   GFR: Estimated Creatinine Clearance: 12.7 mL/min (A) (by C-G formula based on SCr of 6.94 mg/dL (H)).   Recent Results (from the past 240 hour(s))  Resp Panel by RT-PCR (Flu A&B, Covid) Nasopharyngeal Swab     Status: None   Collection Time: 09/02/20  7:33 PM   Specimen: Nasopharyngeal Swab; Nasopharyngeal(NP) swabs in vial transport medium  Result Value Ref Range Status   SARS Coronavirus 2 by RT PCR NEGATIVE NEGATIVE Final    Comment: (NOTE) SARS-CoV-2 target nucleic acids are NOT DETECTED.  The SARS-CoV-2 RNA is generally detectable in upper respiratory specimens during the acute phase of infection. The lowest concentration of SARS-CoV-2 viral copies this assay can detect is 138 copies/mL. A negative result does not preclude SARS-Cov-2 infection and should not be used as the sole basis for treatment or other patient  management decisions. A negative result may occur with  improper specimen collection/handling, submission of specimen other than nasopharyngeal swab, presence of viral mutation(s) within the areas targeted by this assay, and inadequate number of viral copies(<138 copies/mL). A negative result must be combined with clinical observations, patient history, and epidemiological information. The expected result is Negative.  Fact Sheet for Patients:  EntrepreneurPulse.com.au  Fact Sheet for Healthcare Providers:  IncredibleEmployment.be  This test is no t yet approved or cleared by the [place] FDA and  has been authorized for detection and/or diagnosis of SARS-CoV-2 by FDA under an Emergency Use Authorization (EUA). This EUA will remain  in effect (meaning this test can be used) for the duration of the COVID-19 declaration under Section 564(b)(1) of the Act, 21 U.S.C.section 360bbb-3(b)(1), unless the authorization is terminated  or revoked sooner.       Influenza A by PCR NEGATIVE NEGATIVE Final   Influenza B by PCR NEGATIVE NEGATIVE Final  Comment: (NOTE) The Xpert Xpress SARS-CoV-2/FLU/RSV plus assay is intended as an aid in the diagnosis of influenza from Nasopharyngeal swab specimens and should not be used as a sole basis for treatment. Nasal washings and aspirates are unacceptable for Xpert Xpress SARS-CoV-2/FLU/RSV testing.  Fact Sheet for Patients: EntrepreneurPulse.com.au  Fact Sheet for Healthcare Providers: IncredibleEmployment.be  This test is not yet approved or cleared by the [place] FDA and has been authorized for detection and/or diagnosis of SARS-CoV-2 by FDA under an Emergency Use Authorization (EUA). This EUA will remain in effect (meaning this test can be used) for the duration of the COVID-19 declaration under Section 564(b)(1) of the Act, 21 U.S.C. section 360bbb-3(b)(1),  unless the authorization is terminated or revoked.  Performed at [facility], [place] [address], [place], [place] [address]       Radiology Studies: ECHOCARDIOGRAM COMPLETE  Result Date: 09/03/2020    ECHOCARDIOGRAM REPORT   Patient Name:   ABRIAN HANOVER Date of Exam: 09/03/2020 Medical Rec #:  [id number]     Height:       71.0 in Accession #:    [id number]    Weight:       152.0 lb Date of Birth:  1970-11-01      BSA:          1.876 m Patient Age:    69 years      BP:           146/73 mmHg Patient Gender: M             HR:           94 bpm. Exam Location:  Inpatient Procedure: Cardiac Doppler and Color Doppler Indications:    R07.9* Chest pain, unspecified  History:        Patient has prior history of Echocardiogram examinations, most                 recent 05/18/2020. Descending thoraciic disection repaired; AVR                 2006. Patient states he has had chest pain for six years since                 his surgery but this is different.                 Aortic Valve: bioprosthetic valve is present in the aortic                 position.  Sonographer:    Merrie Roof RDCS Referring Phys: [id number] [place]  1. 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 S/P AVR with elevated mean gradient of 33.5 mmHg (similar to gradient of 31 mmHg on 05/18/20 study).  2. Left ventricular ejection fraction, by estimation, is >75%. The left ventricle has hyperdynamic function. The left ventricle has no regional wall motion abnormalities. Left ventricular diastolic parameters are consistent with Grade I diastolic dysfunction (impaired relaxation). Elevated left atrial pressure.  3. Right ventricular systolic function is normal. The right ventricular size is normal.  4. Left atrial size was moderately dilated.  5. Right atrial size was moderately dilated.  6. The mitral valve is normal in structure. Trivial mitral valve regurgitation. No evidence of mitral stenosis.  7. The aortic valve has been  repaired/replaced. Aortic valve regurgitation is not visualized. Moderate aortic valve stenosis. There is a bioprosthetic valve present in the aortic position. FINDINGS  Left Ventricle: Left ventricular ejection  fraction, by estimation, is >75%. The left ventricle has hyperdynamic function. The left ventricle has no regional wall motion abnormalities. The left ventricular internal cavity size was normal in size. There is no left ventricular hypertrophy. Left ventricular diastolic parameters are consistent with Grade I diastolic dysfunction (impaired relaxation). Elevated left atrial pressure. Right Ventricle: The right ventricular size is normal. Right ventricular systolic function is normal. Left Atrium: Left atrial size was moderately dilated. Right Atrium: Right atrial size was moderately dilated. Pericardium: There is no evidence of pericardial effusion. Mitral Valve: The mitral valve is normal in structure. Mild mitral annular calcification. Trivial mitral valve regurgitation. No evidence of mitral valve stenosis. Tricuspid Valve: The tricuspid valve is normal in structure. Tricuspid valve regurgitation is trivial. No evidence of tricuspid stenosis. Aortic Valve: The aortic valve has been repaired/replaced. Aortic valve regurgitation is not visualized. Moderate aortic stenosis is present. Aortic valve mean gradient measures 33.5 mmHg. Aortic valve peak gradient measures 55.2 mmHg. Aortic valve area,  by VTI measures 1.30 cm. There is a bioprosthetic valve present in the aortic position. Pulmonic Valve: The pulmonic valve was normal in structure. Pulmonic valve regurgitation is trivial. No evidence of pulmonic stenosis. Aorta: The aortic root is normal in size and structure. Venous: The inferior vena cava was not well visualized. IAS/Shunts: No atrial level shunt detected by color flow Doppler. 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 S/P AVR with elevated mean gradient of  33.5 mmHg (similar to gradient of 31 mmHg on 05/18/20 study).  LEFT VENTRICLE PLAX 2D LVIDd:         4.30 cm  Diastology LVIDs:         2.70 cm  LV e' medial:    3.59 cm/s LV PW:         1.90 cm  LV E/e' medial:  25.4 LV IVS:        1.80 cm  LV e' lateral:   6.09 cm/s LVOT diam:     2.10 cm  LV E/e' lateral: 15.0 LV SV:         79 LV SV Index:   42 LVOT Area:     3.46 cm  RIGHT VENTRICLE RV Basal diam:  4.00 cm LEFT ATRIUM             Index       RIGHT ATRIUM           Index LA diam:        4.50 cm 2.40 cm/m  RA Area:     22.50 cm LA Vol (A2C):   83.1 ml 44.29 ml/m RA Volume:   73.30 ml  39.06 ml/m LA Vol (A4C):   60.3 ml 32.14 ml/m LA Biplane Vol: 75.4 ml 40.18 ml/m  AORTIC VALVE AV Area (Vmax):    1.19 cm AV Area (Vmean):   1.16 cm AV Area (VTI):     1.30 cm AV Vmax:           371.50 cm/s AV Vmean:          273.500 cm/s AV VTI:            0.610 m AV Peak Grad:      55.2 mmHg AV Mean Grad:      33.5 mmHg LVOT Vmax:         128.00 cm/s LVOT Vmean:        91.700 cm/s LVOT VTI:          0.229 m LVOT/AV VTI ratio:  0.38  AORTA Ao Root diam: 3.70 cm Ao Asc diam:  3.70 cm MITRAL VALVE MV Area (PHT): 4.21 cm     SHUNTS MV Decel Time: 180 msec     Systemic VTI:  0.23 m MV E velocity: 91.20 cm/s   Systemic Diam: 2.10 cm MV A velocity: 133.00 cm/s MV E/A ratio:  0.69 Kirk Ruths MD Electronically signed by Kirk Ruths MD Signature Date/Time: 09/03/2020/12:15:17 PM    Final     Scheduled Meds:  amLODipine  10 mg Oral Daily   arformoterol  15 mcg Nebulization BID   And   umeclidinium bromide  1 puff Inhalation Daily   aspirin  81 mg Oral Daily   atorvastatin  80 mg Oral Daily   carvedilol  50 mg Oral BID WC   cloNIDine  0.3 mg Transdermal Weekly   doxazosin  16 mg Oral QHS   heparin  5,000 Units Subcutaneous Q12H   hydrALAZINE  75 mg Oral TID   lidocaine  1 patch Transdermal Q24H   minoxidil  20 mg Oral Daily   pantoprazole  40 mg Oral Daily   Continuous Infusions:   LOS: 0 days   Time spent:  25 minutes.  Patrecia Pour, MD [facility] [email or web address] 09/04/2020, 2:57 PM

## 2020-09-04 NOTE — Progress Notes (Addendum)
Progress Note  Patient Name: [name] Date of Encounter: 09/04/2020  [facility] Cardiologist: None   Subjective   Still having episodes of chest pain this morning, left sided radiating down into his left arm. Received oxy and SL nitro. Says oxy did not help, nitro helps to bring the pain down, but then returns.   Very frustrated, concerned over his pain. Just received SL nitro with some improvement, rating 7/10 from 10/10. Wife at the bedside with questions as well.   Inpatient Medications    Scheduled Meds:  amLODipine  10 mg Oral Daily   arformoterol  15 mcg Nebulization BID   And   umeclidinium bromide  1 puff Inhalation Daily   aspirin  81 mg Oral Daily   atorvastatin  80 mg Oral Daily   carvedilol  37.5 mg Oral BID WC   cloNIDine  0.3 mg Transdermal Weekly   doxazosin  16 mg Oral QHS   heparin  5,000 Units Subcutaneous Q12H   hydrALAZINE  75 mg Oral TID   lidocaine  1 patch Transdermal Q24H   minoxidil  20 mg Oral Daily   pantoprazole  40 mg Oral Daily   Continuous Infusions:  PRN Meds: acetaminophen, albuterol, nitroGLYCERIN, oxyCODONE, prochlorperazine   Vital Signs    Vitals:   09/04/20 0605 09/04/20 0615 09/04/20 0758 09/04/20 0759  BP: 105/61 116/65 136/75 136/75  Pulse: (!) 107 (!) 103 96 96  Resp:   20   Temp:   (!) 97.4 F (36.3 C)   TempSrc:   Oral   SpO2: 99% 100% 100% 100%  Weight:      Height:        Intake/Output Summary (Last 24 hours) at 09/04/2020 0851 Last data filed at 09/03/2020 1700 Gross per 24 hour  Intake --  Output 400 ml  Net -400 ml   Last 3 Weights 09/02/2020 08/30/2020 07/23/2020  Weight (lbs) 152 lb 152 lb 12.8 oz 158 lb 12.8 oz  Weight (kg) 68.947 kg 69.31 kg 72.031 kg      Telemetry    SR- ST rates 90s-100s - Personally Reviewed  ECG    09/04/20 0804am NSR 93 bpm, LVH, does have new TWI in v4 when compared to prior tracing - Personally Reviewed  Physical Exam   GEN: No acute distress.   Neck: No  JVD Cardiac: RRR, + systolic murmur, no rubs, or gallops.  Respiratory: Clear to auscultation bilaterally. GI: Soft, nontender, non-distended  MS: No edema; No deformity. Neuro:  Nonfocal  Psych: Normal affect   Labs    High Sensitivity Troponin:   Recent Labs  Lab 09/02/20 0118 09/02/20 0308 09/02/20 0840  TROPONINIHS 85* 79* 76*      Chemistry Recent Labs  Lab 08/30/20 1037 09/02/20 0118  NA 139 142  K 4.6 4.4  CL 110 111  CO2 21* 22  GLUCOSE 94 120*  BUN 52* 51*  CREATININE 6.53* 6.94*  CALCIUM 8.9 8.8*  GFRNONAA 10* 9*  ANIONGAP 8 9     Hematology Recent Labs  Lab 09/02/20 0118  WBC 4.6  RBC 3.84*  HGB 11.8*  HCT 36.2*  MCV 94.3  MCH 30.7  MCHC 32.6  RDW 13.7  PLT 78*    BNPNo results for input(s): BNP, PROBNP in the last 168 hours.   DDimer No results for input(s): DDIMER in the last 168 hours.   Radiology    ECHOCARDIOGRAM COMPLETE  Result Date: 09/03/2020    ECHOCARDIOGRAM REPORT  Patient Name:   [name] Date of Exam: 09/03/2020 Medical Rec #:  [id number]     Height:       71.0 in Accession #:    [id number]    Weight:       152.0 lb Date of Birth:  Jan 25, 1971      BSA:          1.876 m Patient Age:    50 years      BP:           146/73 mmHg Patient Gender: M             HR:           94 bpm. Exam Location:  Inpatient Procedure: Cardiac Doppler and Color Doppler Indications:    R07.9* Chest pain, unspecified  History:        Patient has prior history of Echocardiogram examinations, most                 recent 05/18/2020. Descending thoraciic disection repaired; AVR                 2006. Patient states he has had chest pain for six years since                 his surgery but this is different.                 Aortic Valve: bioprosthetic valve is present in the aortic                 position.  Sonographer:    Merrie Roof RDCS Referring Phys: [id number]  1. 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 S/P AVR with  elevated mean gradient of 33.5 mmHg (similar to gradient of 31 mmHg on 05/18/20 study).  2. Left ventricular ejection fraction, by estimation, is >75%. The left ventricle has hyperdynamic function. The left ventricle has no regional wall motion abnormalities. Left ventricular diastolic parameters are consistent with Grade I diastolic dysfunction (impaired relaxation). Elevated left atrial pressure.  3. Right ventricular systolic function is normal. The right ventricular size is normal.  4. Left atrial size was moderately dilated.  5. Right atrial size was moderately dilated.  6. The mitral valve is normal in structure. Trivial mitral valve regurgitation. No evidence of mitral stenosis.  7. The aortic valve has been repaired/replaced. Aortic valve regurgitation is not visualized. Moderate aortic valve stenosis. There is a bioprosthetic valve present in the aortic position. FINDINGS  Left Ventricle: Left ventricular ejection fraction, by estimation, is >75%. The left ventricle has hyperdynamic function. The left ventricle has no regional wall motion abnormalities. The left ventricular internal cavity size was normal in size. There is no left ventricular hypertrophy. Left ventricular diastolic parameters are consistent with Grade I diastolic dysfunction (impaired relaxation). Elevated left atrial pressure. Right Ventricle: The right ventricular size is normal. Right ventricular systolic function is normal. Left Atrium: Left atrial size was moderately dilated. Right Atrium: Right atrial size was moderately dilated. Pericardium: There is no evidence of pericardial effusion. Mitral Valve: The mitral valve is normal in structure. Mild mitral annular calcification. Trivial mitral valve regurgitation. No evidence of mitral valve stenosis. Tricuspid Valve: The tricuspid valve is normal in structure. Tricuspid valve regurgitation is trivial. No evidence of tricuspid stenosis. Aortic Valve: The aortic valve has been  repaired/replaced. Aortic valve regurgitation is not visualized. Moderate aortic stenosis is present. Aortic valve mean gradient measures 33.5 mmHg. Aortic  valve peak gradient measures 55.2 mmHg. Aortic valve area,  by VTI measures 1.30 cm. There is a bioprosthetic valve present in the aortic position. Pulmonic Valve: The pulmonic valve was normal in structure. Pulmonic valve regurgitation is trivial. No evidence of pulmonic stenosis. Aorta: The aortic root is normal in size and structure. Venous: The inferior vena cava was not well visualized. IAS/Shunts: No atrial level shunt detected by color flow Doppler. 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 S/P AVR with elevated mean gradient of 33.5 mmHg (similar to gradient of 31 mmHg on 05/18/20 study).  LEFT VENTRICLE PLAX 2D LVIDd:         4.30 cm  Diastology LVIDs:         2.70 cm  LV e' medial:    3.59 cm/s LV PW:         1.90 cm  LV E/e' medial:  25.4 LV IVS:        1.80 cm  LV e' lateral:   6.09 cm/s LVOT diam:     2.10 cm  LV E/e' lateral: 15.0 LV SV:         79 LV SV Index:   42 LVOT Area:     3.46 cm  RIGHT VENTRICLE RV Basal diam:  4.00 cm LEFT ATRIUM             Index       RIGHT ATRIUM           Index LA diam:        4.50 cm 2.40 cm/m  RA Area:     22.50 cm LA Vol (A2C):   83.1 ml 44.29 ml/m RA Volume:   73.30 ml  39.06 ml/m LA Vol (A4C):   60.3 ml 32.14 ml/m LA Biplane Vol: 75.4 ml 40.18 ml/m  AORTIC VALVE AV Area (Vmax):    1.19 cm AV Area (Vmean):   1.16 cm AV Area (VTI):     1.30 cm AV Vmax:           371.50 cm/s AV Vmean:          273.500 cm/s AV VTI:            0.610 m AV Peak Grad:      55.2 mmHg AV Mean Grad:      33.5 mmHg LVOT Vmax:         128.00 cm/s LVOT Vmean:        91.700 cm/s LVOT VTI:          0.229 m LVOT/AV VTI ratio: 0.38  AORTA Ao Root diam: 3.70 cm Ao Asc diam:  3.70 cm MITRAL VALVE MV Area (PHT): 4.21 cm     SHUNTS MV Decel Time: 180 msec     Systemic VTI:  0.23 m MV E velocity: 91.20 cm/s    Systemic Diam: 2.10 cm MV A velocity: 133.00 cm/s MV E/A ratio:  0.69 Kirk Ruths MD Electronically signed by Kirk Ruths MD Signature Date/Time: 09/03/2020/12:15:17 PM    Final     Cardiac Studies   Echo: 09/03/20  IMPRESSIONS     1. 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 S/P AVR with  elevated mean gradient of 33.5 mmHg (similar to gradient of 31 mmHg on  05/18/20 study).   2. Left ventricular ejection fraction, by estimation, is >75%. The left  ventricle has hyperdynamic function. The left ventricle has no regional  wall motion abnormalities. Left ventricular diastolic parameters are  consistent with Grade I diastolic  dysfunction (impaired relaxation). Elevated left atrial pressure.   3. Right ventricular systolic function is normal. The right ventricular  size is normal.   4. Left atrial size was moderately dilated.   5. Right atrial size was moderately dilated.   6. The mitral valve is normal in structure. Trivial mitral valve  regurgitation. No evidence of mitral stenosis.   7. The aortic valve has been repaired/replaced. Aortic valve  regurgitation is not visualized. Moderate aortic valve stenosis. There is  a bioprosthetic valve present in the aortic position.   Patient Profile     50 y.o. 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 Christy Gentles.  Assessment & Plan    Persistent chest pain with known CAD: He has a long cardiac hx as outlined above however he reports that his most recent episode of chest pain has been worse and more intense than what he has experienced in the past. He states that he was recently sick with COVID about 2-3 weeks ago and states that he was doing  well until 3 days ago when he began having very sharp chest pain that seemed worse when laying flat and with deep inspiration. -- HsT found to be 85>>79>>76, in the setting of CKD stage IV/V -- EKG this morning SR 93 bpm, LVH, new TWI in v4 -- initial concern for pericarditis/myocarditis, but inflammatory markers are normal therefore not sure of utility cardiac MRI -- echo showed hyperdynamic function >75% with G1DD, moderate biatrial enlargement, no pericardial effusion -- does have improvement in symptoms with nitro, had been tried on Imdur in the past, but unable to tolerate 2/2 headaches   2. 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 It is felt LVH is related to prior ETOH use and uncontrolled HTN per AHF notes   3. AAA s/p Bentall: Echo in 3/13 showed moderate to severe eccentric MR and mild to moderate AI with prolapsing left coronary cusp which was followed for several years>>now s/p Bentall procedure with bioprosthetic aortic valve 9/16 at [facility] -- stable valve position on echo -- He continues to complain of chest pain in the left chest saying this is similar to what he experienced with his AAA, but is notably tender to the left upper chest with palpation   4. 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    5. Hx of CVA: Has loop implantation -- Continue Repatha,  statin     6. GERD: -- Continue Protonix    7. HTN: blood pressures are labile  -- Continue PTA amlodipine, carvedilol, hydralazine, clonidine, doxazosin -- HR in  the low 100s this morning, per discussion with Dr. Claiborne Billings will continue to titrate coreg to 50mg  BID  For questions or updates, please contact Sebastian Please consult [email or web address] for contact info under        Signed, Reino Bellis, NP  09/04/2020, 8:51 AM     Patient seen and examined. Agree with assessment and plan. He continues to have chest wall pain.  HR in mid 90s, will further titrate coreg to 50 mg bid today. His echo Doppler study shows marked LVH possible speckled pattern raising concern for amyloidosis.  However in the past the patient had a technetium pyrophosphate scan and negative myeloma evaluation. Due to his renal insufficiency he has not been able to get a cardiac MRI.  Similarly, unable to assess with CT imaging is more chronic chest pain.  With his known mild CAD we will try adding very low-dose isosorbide 15 mg today.  Remotely,  he had developed nitrate induced headache.   [name] Sine, MD, [facility] 09/04/2020 9:33 AM

## 2020-09-04 NOTE — Progress Notes (Signed)
Pt stated the worst pain ever 15/10 , Left mis chest , localized, just after having a nap, VSS. EKG done. Mancel Bale, NP aware. New order in [facility]. Pain 2/10 after Lidocaine patch, Oxycodone, Nitroglycerin administration. [name]

## 2020-09-05 ENCOUNTER — Observation Stay (HOSPITAL_COMMUNITY): Payer: Medicare HMO

## 2020-09-05 DIAGNOSIS — I10 Essential (primary) hypertension: Secondary | ICD-10-CM

## 2020-09-05 DIAGNOSIS — Z20822 Contact with and (suspected) exposure to covid-19: Secondary | ICD-10-CM | POA: Diagnosis not present

## 2020-09-05 DIAGNOSIS — N186 End stage renal disease: Secondary | ICD-10-CM | POA: Diagnosis not present

## 2020-09-05 DIAGNOSIS — I517 Cardiomegaly: Secondary | ICD-10-CM | POA: Diagnosis not present

## 2020-09-05 DIAGNOSIS — I132 Hypertensive heart and chronic kidney disease with heart failure and with stage 5 chronic kidney disease, or end stage renal disease: Secondary | ICD-10-CM | POA: Diagnosis not present

## 2020-09-05 DIAGNOSIS — Z952 Presence of prosthetic heart valve: Secondary | ICD-10-CM | POA: Diagnosis not present

## 2020-09-05 DIAGNOSIS — N185 Chronic kidney disease, stage 5: Secondary | ICD-10-CM | POA: Diagnosis not present

## 2020-09-05 DIAGNOSIS — Z8616 Personal history of COVID-19: Secondary | ICD-10-CM | POA: Diagnosis not present

## 2020-09-05 DIAGNOSIS — R079 Chest pain, unspecified: Secondary | ICD-10-CM | POA: Diagnosis not present

## 2020-09-05 LAB — TSH: TSH: 3.472 u[IU]/mL (ref 0.350–4.500)

## 2020-09-05 LAB — COMPREHENSIVE METABOLIC PANEL
ALT: 14 U/L (ref 0–44)
AST: 19 U/L (ref 15–41)
Albumin: 3 g/dL — ABNORMAL LOW (ref 3.5–5.0)
Alkaline Phosphatase: 59 U/L (ref 38–126)
Anion gap: 7 (ref 5–15)
BUN: 59 mg/dL — ABNORMAL HIGH (ref 6–20)
CO2: 23 mmol/L (ref 22–32)
Calcium: 8.8 mg/dL — ABNORMAL LOW (ref 8.9–10.3)
Chloride: 106 mmol/L (ref 98–111)
Creatinine, Ser: 8.56 mg/dL — ABNORMAL HIGH (ref 0.61–1.24)
GFR, Estimated: 7 mL/min — ABNORMAL LOW (ref >60)
Glucose, Bld: 125 mg/dL — ABNORMAL HIGH (ref 70–99)
Potassium: 4.3 mmol/L (ref 3.5–5.1)
Sodium: 136 mmol/L (ref 135–145)
Total Bilirubin: 0.7 mg/dL (ref 0.3–1.2)
Total Protein: 5.5 g/dL — ABNORMAL LOW (ref 6.5–8.1)

## 2020-09-05 LAB — CBC
HCT: 27 % — ABNORMAL LOW (ref 39.0–52.0)
Hemoglobin: 9 g/dL — ABNORMAL LOW (ref 13.0–17.0)
MCH: 30.6 pg (ref 26.0–34.0)
MCHC: 33.3 g/dL (ref 30.0–36.0)
MCV: 91.8 fL (ref 80.0–100.0)
Platelets: 67 10*3/uL — ABNORMAL LOW (ref 150–400)
RBC: 2.94 MIL/uL — ABNORMAL LOW (ref 4.22–5.81)
RDW: 12.7 % (ref 11.5–15.5)
WBC: 3.2 10*3/uL — ABNORMAL LOW (ref 4.0–10.5)
nRBC: 0 % (ref 0.0–0.2)

## 2020-09-05 LAB — GLUCOSE, CAPILLARY: Glucose-Capillary: 124 mg/dL — ABNORMAL HIGH (ref 70–99)

## 2020-09-05 MED ORDER — ISOSORBIDE MONONITRATE ER 30 MG PO TB24
30.0000 mg | ORAL_TABLET | Freq: Every day | ORAL | Status: DC
  Administered 2020-09-06 – 2020-09-11 (×6): 30 mg via ORAL
  Filled 2020-09-05 (×6): qty 1

## 2020-09-05 MED ORDER — ISOSORBIDE MONONITRATE ER 30 MG PO TB24
15.0000 mg | ORAL_TABLET | Freq: Once | ORAL | Status: AC
  Administered 2020-09-05: 15 mg via ORAL
  Filled 2020-09-05: qty 1

## 2020-09-05 NOTE — Progress Notes (Addendum)
Pt with complaint of pain rated 8/10 to left chest described as aching and stabbing. VS TAKEN WITH HR OF 108, BP-131/79, O2-97% ON RA. Respiratory in room to give patient scheduled breathing treatments. Paged cardiologist np. Awaiting further orders. Will continue to monitor pt.  Patient with pain rated 0/10 5 minutes after receiving nitro sublingual x1.

## 2020-09-05 NOTE — Progress Notes (Signed)
Progress Note  Patient Name: [name] Date of Encounter: 09/05/2020  [facility] Cardiologist: Dr. Aundra Dubin  Subjective   Patient feels improved today.  Currently not having chest pain  Inpatient Medications    Scheduled Meds:  amLODipine  10 mg Oral Daily   arformoterol  15 mcg Nebulization BID   And   umeclidinium bromide  1 puff Inhalation Daily   aspirin  81 mg Oral Daily   atorvastatin  80 mg Oral Daily   carvedilol  50 mg Oral BID WC   cloNIDine  0.3 mg Transdermal Weekly   doxazosin  16 mg Oral QHS   heparin  5,000 Units Subcutaneous Q12H   hydrALAZINE  75 mg Oral TID   isosorbide mononitrate  15 mg Oral Daily   lidocaine  1 patch Transdermal Q24H   minoxidil  20 mg Oral Daily   pantoprazole  40 mg Oral Daily   Continuous Infusions:  PRN Meds: acetaminophen, albuterol, nitroGLYCERIN, oxyCODONE, prochlorperazine   Vital Signs    Vitals:   09/05/20 0431 09/05/20 0820 09/05/20 0859 09/05/20 0905  BP: 113/61 120/67  131/79  Pulse: 100 100 (!) 108   Resp: _0 Temp: 98.9 F (37.2 C) 98.5 F (36.9 C)    TempSrc: Oral Oral    SpO2: 95% 97% 100%   Weight:      Height:        Intake/Output Summary (Last 24 hours) at 09/05/2020 1040 Last data filed at 09/04/2020 1827 Gross per 24 hour  Intake --  Output 500 ml  Net -500 ml   Last 3 Weights 09/02/2020 08/30/2020 07/23/2020  Weight (lbs) 152 lb 152 lb 12.8 oz 158 lb 12.8 oz  Weight (kg) 68.947 kg 69.31 kg 72.031 kg      Telemetry    Sinus rhythm now in the 70s- Personally Reviewed  ECG    Sinus rhythm at 98 bpm with LVH and repolarization changes.- Personally Reviewed  Physical Exam   GEN: No acute distress.   Neck: No JVD Chest wall: Improvement in prior pain Cardiac: Rate and rhythm, 8-1/0 systolic murmur; no friction rub Respiratory: Clear to auscultation bilaterally. GI: Soft, nontender, non-distended  MS: No edema; No deformity. Neuro:  Nonfocal  Psych: Normal affect   Labs     High Sensitivity Troponin:   Recent Labs  Lab 09/02/20 0118 09/02/20 0308 09/02/20 0840  TROPONINIHS 85* 79* 76*      Chemistry Recent Labs  Lab 08/30/20 1037 09/02/20 0118  NA 139 142  K 4.6 4.4  CL 110 111  CO2 21* 22  GLUCOSE 94 120*  BUN 52* 51*  CREATININE 6.53* 6.94*  CALCIUM 8.9 8.8*  GFRNONAA 10* 9*  ANIONGAP 8 9     Hematology Recent Labs  Lab 09/02/20 0118  WBC 4.6  RBC 3.84*  HGB 11.8*  HCT 36.2*  MCV 94.3  MCH 30.7  MCHC 32.6  RDW 13.7  PLT 78*    BNPNo results for input(s): BNP, PROBNP in the last 168 hours.   DDimer No results for input(s): DDIMER in the last 168 hours.   Radiology    No results found.  Cardiac Studies   Echo: 09/03/20   IMPRESSIONS     1. 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 S/P AVR with  elevated mean gradient of 33.5 mmHg (similar to gradient of 31 mmHg on  05/18/20 study).   2. Left ventricular ejection fraction, by estimation, is >75%. The left  ventricle has hyperdynamic function. The left ventricle has no regional  wall motion abnormalities. Left ventricular diastolic parameters are  consistent with Grade I diastolic  dysfunction (impaired relaxation). Elevated left atrial pressure.   3. Right ventricular systolic function is normal. The right ventricular  size is normal.   4. Left atrial size was moderately dilated.   5. Right atrial size was moderately dilated.   6. The mitral valve is normal in structure. Trivial mitral valve  regurgitation. No evidence of mitral stenosis.   7. The aortic valve has been repaired/replaced. Aortic valve  regurgitation is not visualized. Moderate aortic valve stenosis. There is  a bioprosthetic valve present in the aortic position.  Patient Profile     50 y.o. 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 Christy Gentles.  Assessment & Plan    Persistent chest pain with known CAD: HsT found to be 85>>79>>76, in the setting of CKD stage IV/V. EKG with severe LVH (similar to prior tracings) -- initial concern for pericarditis/myocarditis, but inflammatory markers are normal therefore not sure of utility cardiac MRI -- echo showed hyperdynamic function >75% with G1DD, moderate biatrial enlargement, no pericardial effusion -- does have improvement in symptoms with nitro, had been tried on Imdur in the past, but unable to tolerate 2/2 headaches. Started on low dose Imdyr 67m daily yesterday   2. 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 It is felt LVH is related to prior ETOH use and uncontrolled HTN per AHF notes. Urine immunofixation neg. No further work up   3. AAA s/p Bentall: Echo in 3/13 showed moderate to severe eccentric MR and mild to moderate AI with prolapsing left coronary cusp which was followed for several years>>now s/[facility] Bentall [facility] stable valve position on echo -- He continues to complain of chest pain which seems more so chronic in nature saying this is similar to what he experienced with his AAA, but is notably tender to the left upper chest with palpation   4. 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  --  patient is concerned regarding AAA hx, no CT ordered on admission suspect 2/2 renal disease which has been relayed to the patient.    5. Hx of CVA: Has loop implantation, followed by EP -- Continue Repatha, statin     6. GERD: -- Continue Protonix    7. HTN: blood pressures stable -- Continue PTA amlodipine, carvedilol, hydralazine, clonidine, doxazosin -- coreg increased to 526mBID yesterday  For questions or updates, please contact [facility] consult [email or web address] for contact info under       Signed, LiReino BellisNP  09/05/2020, 10:40 AM  Patient seen and examined. Agree with assessment and plan.  Patient feels improved today.  His pulse is now in the 70s and he is tolerating increased carvedilol to 50 mg twice a day.  Low-dose nitrates were added yesterday at 15 mg.  We will try to increase to 30 mg today.  He is not had recent laboratory.  We will recheck c-Met, CBC with previous low platelets, as well as TSH.  Otherwise echo Doppler study showed severe LVH with possible speckled pattern worrisome for amyloid, he had undergone prior evaluation with negative technetium pyrophosphate scan and myeloma work-up.  He is tolerating hydralazine 75 mg 3 times daily with nitrate addition.   [name] Sine, MD, [facility] 09/05/2020 11:29 AM

## 2020-09-05 NOTE — Progress Notes (Signed)
PROGRESS NOTE  [name]  DSK:[id number] DOB: Jul 23, 1970 DOA: 09/02/2020 PCP: Kerin Perna, NP   Brief Narrative: [name] is a 50 y.o. 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   Patient has had multiple episodes of chest pain this year, all these episodes always considered not ACS etiology. This time, patient started to have left-sided chest pains, dull to pressure-like episodic, each episode last for 5 to 10 minutes, worsening with upper torso movement and left arm movement, relieved with sitting down.  Denied any associated symptoms such as feeling nauseous vomiting, palpitations or sweating.  She has been taking around-the-clock Tylenol to prevent this episode from happening however he still has 5/10 pain this morning, and came to ED. Trop 85>79>76, EKG showed LVH with secondary changes chronic.    Cardiology was consulted and is driving medication titration.   Assessment & Plan: Active Problems:   Chest pain, rule out acute myocardial infarction   Chest pain  Blurry vision: Predominant right eye without other focal deficits on exam this afternoon. Onset said to have been last night. ?if cerebral hypoperfusion with variable blood pressures.  - With hx CVA, will check CT r/o CVA.  - Aim to equilibrate blood pressure, avoid hypotension. - Neurochecks - Check CBG  - May need dedicated ophtho exam if no CT findings to suggest etiology.  Chest pain: Not typical of ACS with no ischemic ECG changes, though he has known CAD. Mild troponin elevation especially in light of severe renal impairment. This may be more flaring of chronic chest pain he associates with open heart surgery for  MVR/AAA Tx.  - Responsive to nitro, not narcotics > cardiology started low dose imdur 6/22, increasing to 20mg  today, also increase coreg to 50mg  po BID.  - Continue ASA, statin, beta blocker - Lidocaine patch, tylenol prn.    Chronic combined HFrEF: LVEF 2013 was 35-40% and has been up and down since, most recently normalized to 60-65% with G2DD in March 2022.  - Echo this admission with hyperdynamic LVEF >75%, G1DD, moderate biatrial enlargement, no pericardial effusion.  - Echo with speckled LVH > renal impairment not yet on HD precludes contrast (MRI or CT). Immunofixation negative. No further work up currently planned per cardiology  Resistant HTN:  - Continue clonidine patch, minoxidil, amlodipine, carvedilol, hydralazine, doxazosin. Coreg and imdur being titrated upward by cardiology.   CKD stage V: No indications for acute initiation of hemodialysis at this time. Will continue outpatient nephrology follow up for timing of HD initiation.  Anemia of CKD: Seems to be progressive with worsening renal function. No evidence of bleeding.  - Continue monitoring. Will check FOBT.    Chronic systolic CHF -Euvolemic, no indication for diuresis.   Biosynthetic AVR and mitral valve - Appears to be stable, f/u echo.    HLD - Outpatient Repatha   History of CVA: s/p loop recorder.  - Follow up with cardiology for loop recorder management - Continue statin, repatha  Chronic thrombocytopenia - Stable, monitoring.  COPD: Quiescent.  - Continue BDs.   GERD:  - PPI  DVT prophylaxis: Heparin q12h Code Status: Full Family Communication: None at bedside Disposition Plan:  Status is: Observation. Disposition timing per cardiology.  Dispo: The patient is from: Home              Anticipated d/c is to: Home          Consultants:  Cardiology  Procedures:  None  Antimicrobials: None   Subjective: No chest pain currently. This afternoon he reports blurry vision and "something  ain't right, doc." Finding it difficult to elaborate. Unsure of which eye or both, but on exam seems to be right eye. No blackout of vision. +Some lightheadedness. No leg swelling. Denies any bleeding.   Objective: Vitals:   09/05/20 0820 09/05/20 0859 09/05/20 0905 09/05/20 1253  BP: 120/67  131/79 (!) 99/53  Pulse: 100 (!) 108  78  Resp: 16 20  20   Temp: 98.5 F (36.9 C)   97.8 F (36.6 C)  TempSrc: Oral   Oral  SpO2: 97% 100%  96%  Weight:      Height:        Intake/Output Summary (Last 24 hours) at 09/05/2020 1315 Last data filed at 09/04/2020 1827 Gross per 24 hour  Intake --  Output 500 ml  Net -500 ml   Filed Weights   09/02/20 0043  Weight: 68.9 kg   Gen: 50 y.o. male in no distress Pulm: Nonlabored breathing room air. Clear. CV: Regular rate and rhythm. No murmur, rub, or gallop. No JVD, no dependent edema. GI: Abdomen soft, non-tender, non-distended, with normoactive bowel sounds.  Ext: Warm, no deformities Skin: No rashes, lesions or ulcers on visualized skin. Neuro: Alert and oriented. Decreased gross visual acuity on R. No cranial nerve deficits 2-12. No sensory or motor deficits on exam at this time. Speech normal. Psych: Judgement and insight appear fair. Mood euthymic & affect congruent. Behavior is appropriate.    Data Reviewed: I have personally reviewed following labs and imaging studies  CBC: Recent Labs  Lab 09/02/20 0118  WBC 4.6  HGB 11.8*  HCT 36.2*  MCV 94.3  PLT 78*   Basic Metabolic Panel: Recent Labs  Lab 08/30/20 1037 09/02/20 0118  NA 139 142  K 4.6 4.4  CL 110 111  CO2 21* 22  GLUCOSE 94 120*  BUN 52* 51*  CREATININE 6.53* 6.94*  CALCIUM 8.9 8.8*   GFR: Estimated Creatinine Clearance: 12.7 mL/min (A) (by C-G formula based on SCr of 6.94 mg/dL (H)).   Recent Results (from the past 240 hour(s))  Resp Panel by RT-PCR (Flu A&B, Covid) Nasopharyngeal Swab     Status: None   Collection Time: 09/02/20  7:33 PM   Specimen:  Nasopharyngeal Swab; Nasopharyngeal(NP) swabs in vial transport medium  Result Value Ref Range Status   SARS Coronavirus 2 by RT PCR NEGATIVE NEGATIVE Final    Comment: (NOTE) SARS-CoV-2 target nucleic acids are NOT DETECTED.  The SARS-CoV-2 RNA is generally detectable in upper respiratory specimens during the acute phase of infection. The lowest concentration of SARS-CoV-2 viral copies this assay can detect is 138 copies/mL. A negative result does not preclude SARS-Cov-2 infection and should not be used as the sole basis for treatment or other patient management decisions. A negative result may occur with  improper specimen collection/handling, submission of specimen other than nasopharyngeal swab, presence of viral mutation(s) within the areas targeted by this assay, and inadequate number of viral copies(<138 copies/mL). A negative result must be combined with clinical observations, patient history, and epidemiological information. The expected result is Negative.  Fact Sheet for Patients:  EntrepreneurPulse.com.au  Fact Sheet for Healthcare Providers:  IncredibleEmployment.be  This test is no t yet approved or cleared by the [place] and  has been authorized for detection and/or diagnosis of SARS-CoV-2 by FDA under an Emergency Use Authorization (EUA). This EUA will remain  in effect (meaning this test can be used) for the duration of the COVID-19 declaration under Section 564(b)(1) of the Act, 21 U.S.C.section 360bbb-3(b)(1), unless the authorization is terminated  or revoked sooner.       Influenza A by PCR NEGATIVE NEGATIVE Final   Influenza B by PCR NEGATIVE NEGATIVE Final    Comment: (NOTE) The Xpert Xpress SARS-CoV-2/FLU/RSV plus assay is intended as an aid in the diagnosis of influenza from Nasopharyngeal swab specimens and should not be used as a sole basis for treatment. Nasal washings and aspirates are unacceptable for  Xpert Xpress SARS-CoV-2/FLU/RSV testing.  Fact Sheet for Patients: EntrepreneurPulse.com.au  Fact Sheet for Healthcare Providers: IncredibleEmployment.be  This test is not yet approved or cleared by the [place] FDA and has been authorized for detection and/or diagnosis of SARS-CoV-2 by FDA under an Emergency Use Authorization (EUA). This EUA will remain in effect (meaning this test can be used) for the duration of the COVID-19 declaration under Section 564(b)(1) of the Act, 21 U.S.C. section 360bbb-3(b)(1), unless the authorization is terminated or revoked.  Performed at [facility], [place] [address], [place], [place] [address]       Radiology Studies: No results found.  Scheduled Meds:  amLODipine  10 mg Oral Daily   arformoterol  15 mcg Nebulization BID   And   umeclidinium bromide  1 puff Inhalation Daily   aspirin  81 mg Oral Daily   atorvastatin  80 mg Oral Daily   carvedilol  50 mg Oral BID WC   cloNIDine  0.3 mg Transdermal Weekly   doxazosin  16 mg Oral QHS   heparin  5,000 Units Subcutaneous Q12H   hydrALAZINE  75 mg Oral TID   [START ON 09/06/2020] isosorbide mononitrate  30 mg Oral Daily   lidocaine  1 patch Transdermal Q24H   minoxidil  20 mg Oral Daily   pantoprazole  40 mg Oral Daily   Continuous Infusions:   LOS: 0 days   Time spent: 35 minutes.  Patrecia Pour, MD [facility] [email or web address] 09/05/2020, 1:15 PM

## 2020-09-06 ENCOUNTER — Observation Stay (HOSPITAL_COMMUNITY): Payer: Medicare HMO

## 2020-09-06 DIAGNOSIS — I959 Hypotension, unspecified: Secondary | ICD-10-CM | POA: Diagnosis not present

## 2020-09-06 DIAGNOSIS — D631 Anemia in chronic kidney disease: Secondary | ICD-10-CM | POA: Diagnosis present

## 2020-09-06 DIAGNOSIS — E785 Hyperlipidemia, unspecified: Secondary | ICD-10-CM | POA: Diagnosis present

## 2020-09-06 DIAGNOSIS — N185 Chronic kidney disease, stage 5: Secondary | ICD-10-CM | POA: Diagnosis not present

## 2020-09-06 DIAGNOSIS — E440 Moderate protein-calorie malnutrition: Secondary | ICD-10-CM | POA: Diagnosis present

## 2020-09-06 DIAGNOSIS — I132 Hypertensive heart and chronic kidney disease with heart failure and with stage 5 chronic kidney disease, or end stage renal disease: Secondary | ICD-10-CM | POA: Diagnosis present

## 2020-09-06 DIAGNOSIS — I2510 Atherosclerotic heart disease of native coronary artery without angina pectoris: Secondary | ICD-10-CM | POA: Diagnosis present

## 2020-09-06 DIAGNOSIS — Z8249 Family history of ischemic heart disease and other diseases of the circulatory system: Secondary | ICD-10-CM | POA: Diagnosis not present

## 2020-09-06 DIAGNOSIS — R079 Chest pain, unspecified: Secondary | ICD-10-CM | POA: Diagnosis present

## 2020-09-06 DIAGNOSIS — K219 Gastro-esophageal reflux disease without esophagitis: Secondary | ICD-10-CM | POA: Diagnosis present

## 2020-09-06 DIAGNOSIS — D696 Thrombocytopenia, unspecified: Secondary | ICD-10-CM | POA: Diagnosis present

## 2020-09-06 DIAGNOSIS — Z992 Dependence on renal dialysis: Secondary | ICD-10-CM

## 2020-09-06 DIAGNOSIS — Z952 Presence of prosthetic heart valve: Secondary | ICD-10-CM | POA: Diagnosis not present

## 2020-09-06 DIAGNOSIS — Z955 Presence of coronary angioplasty implant and graft: Secondary | ICD-10-CM | POA: Diagnosis not present

## 2020-09-06 DIAGNOSIS — I69334 Monoplegia of upper limb following cerebral infarction affecting left non-dominant side: Secondary | ICD-10-CM | POA: Diagnosis not present

## 2020-09-06 DIAGNOSIS — N186 End stage renal disease: Secondary | ICD-10-CM

## 2020-09-06 DIAGNOSIS — N2581 Secondary hyperparathyroidism of renal origin: Secondary | ICD-10-CM | POA: Diagnosis present

## 2020-09-06 DIAGNOSIS — Z8616 Personal history of COVID-19: Secondary | ICD-10-CM | POA: Diagnosis not present

## 2020-09-06 DIAGNOSIS — I429 Cardiomyopathy, unspecified: Secondary | ICD-10-CM | POA: Diagnosis present

## 2020-09-06 DIAGNOSIS — J449 Chronic obstructive pulmonary disease, unspecified: Secondary | ICD-10-CM | POA: Diagnosis present

## 2020-09-06 DIAGNOSIS — I10 Essential (primary) hypertension: Secondary | ICD-10-CM | POA: Diagnosis not present

## 2020-09-06 DIAGNOSIS — I517 Cardiomegaly: Secondary | ICD-10-CM | POA: Diagnosis not present

## 2020-09-06 DIAGNOSIS — Z20822 Contact with and (suspected) exposure to covid-19: Secondary | ICD-10-CM | POA: Diagnosis present

## 2020-09-06 DIAGNOSIS — Z8673 Personal history of transient ischemic attack (TIA), and cerebral infarction without residual deficits: Secondary | ICD-10-CM | POA: Diagnosis not present

## 2020-09-06 DIAGNOSIS — I252 Old myocardial infarction: Secondary | ICD-10-CM

## 2020-09-06 DIAGNOSIS — I120 Hypertensive chronic kidney disease with stage 5 chronic kidney disease or end stage renal disease: Secondary | ICD-10-CM

## 2020-09-06 DIAGNOSIS — Z823 Family history of stroke: Secondary | ICD-10-CM | POA: Diagnosis not present

## 2020-09-06 DIAGNOSIS — M5136 Other intervertebral disc degeneration, lumbar region: Secondary | ICD-10-CM | POA: Diagnosis present

## 2020-09-06 DIAGNOSIS — Z953 Presence of xenogenic heart valve: Secondary | ICD-10-CM | POA: Diagnosis not present

## 2020-09-06 DIAGNOSIS — Z800 Family history of malignant neoplasm of digestive organs: Secondary | ICD-10-CM | POA: Diagnosis not present

## 2020-09-06 DIAGNOSIS — I5042 Chronic combined systolic (congestive) and diastolic (congestive) heart failure: Secondary | ICD-10-CM | POA: Diagnosis present

## 2020-09-06 DIAGNOSIS — H35033 Hypertensive retinopathy, bilateral: Secondary | ICD-10-CM | POA: Diagnosis present

## 2020-09-06 LAB — BASIC METABOLIC PANEL
Anion gap: 8 (ref 5–15)
BUN: 65 mg/dL — ABNORMAL HIGH (ref 6–20)
CO2: 22 mmol/L (ref 22–32)
Calcium: 8.6 mg/dL — ABNORMAL LOW (ref 8.9–10.3)
Chloride: 106 mmol/L (ref 98–111)
Creatinine, Ser: 9.1 mg/dL — ABNORMAL HIGH (ref 0.61–1.24)
GFR, Estimated: 7 mL/min — ABNORMAL LOW (ref >60)
Glucose, Bld: 145 mg/dL — ABNORMAL HIGH (ref 70–99)
Potassium: 4 mmol/L (ref 3.5–5.1)
Sodium: 136 mmol/L (ref 135–145)

## 2020-09-06 LAB — IRON AND TIBC
Iron: 109 ug/dL (ref 45–182)
Saturation Ratios: 40 % — ABNORMAL HIGH (ref 17.9–39.5)
TIBC: 273 ug/dL (ref 250–450)
UIBC: 164 ug/dL

## 2020-09-06 MED ORDER — KIDNEY FAILURE BOOK: Freq: Once | Status: AC

## 2020-09-06 MED ORDER — RENA-VITE PO TABS
1.0000 | ORAL_TABLET | Freq: Every day | ORAL | Status: DC
  Administered 2020-09-06 – 2020-09-10 (×5): 1 via ORAL
  Filled 2020-09-06 (×5): qty 1

## 2020-09-06 MED ORDER — POLYVINYL ALCOHOL 1.4 % OP SOLN
1.0000 [drp] | OPHTHALMIC | Status: DC | PRN
  Administered 2020-09-10: 1 [drp] via OPHTHALMIC
  Filled 2020-09-06: qty 15

## 2020-09-06 MED ORDER — ENSURE ENLIVE PO LIQD
237.0000 mL | Freq: Two times a day (BID) | ORAL | Status: DC
  Administered 2020-09-07 – 2020-09-11 (×8): 237 mL via ORAL

## 2020-09-06 NOTE — Consult Note (Signed)
[facility] Renal Consultation Note  Requesting MD:  Indication for Consultation: Maintenance of euvolemia, assessment treatment of chronic kidney disease stage V, assessment of anemia, assessment of electrolyte abnormalities,  HPI:  [name] is a 50 y.o. male.  Admitted 09/02/2020.  This is a gentleman with chronic kidney disease followed by Dr. Joelyn Oms at [facility].  He has been diagnosed with stage V chronic kidney disease and was last seen several weeks ago by the physician assistant.  This was after a significant time of being lost to follow-up.  It was found that his creatinine is about 6 to 8 mg/dL.  Remarkably he is displaying no uremic signs or symptoms.  He presented with chest pain.  He has been evaluated by cardiology.  It is felt the chest pain is noncardiac in origin.  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  His refractory hypertension.   Blood pressure 127/79 pulse 76 temperature 98 O2 sats 98% room air.  Urine output 500 cc 09/05/2020  Sodium 136 potassium 4.3 chloride 106 CO2 23 BUN 59 creatinine 8.56 glucose 125 calcium 8.8 albumin 3.0 hemoglobin 9.0 TSH 3.4.  CT head no acute intracranial abnormalities remote left parietal infarct remote right orbital wall fracture  Chest x-ray no acute cardiopulmonary abnormalities  Amlodipine 10 mg daily, aspirin 81 mg daily, Lipitor 80 mg daily, Coreg 50 mg twice daily, clonidine 0.3 patch weekly, Cardura 60 mg daily, hydralazine 75 mg 3 times daily isosorbide 30 mg daily minoxidil 20 mg daily multivitamins 1 daily, Protonix 40 mg daily     PMHx:   Past Medical History:  Diagnosis Date   Adenomatous colon polyp 08/2015   Anxiety    Aortic disease (HCC)    Aortic dissection (HCC)    a. admx 04/2014 >> L renal infarct; a/c renal failure >> b.  s/p Bioprosthetic  [place] and total arch replacement and staged endovascular repair of descending aortic aneurysm ([facility] - Dr. [name])   CAD (coronary artery disease)    a. LHC 4/16:  oD1 60%   Cardiomyopathy ([place])    a. non-ischemic - probably related to untreated HTN and ETOH abuse - Echo 3/13 with EF 35-40% >> b. Echo 4/16: Severe LVH, EF 55-60%, moderate AI, moderate MR, mild LAE, trivial effusion, known type B dissection with communication between true and false lumens with suprasternal images suggesting dissection plane may propagate to at least left subclavian takeoff, root above aortic valve okay     Chronic abdominal pain    Chronic combined systolic and diastolic congestive heart failure ([place])    a. 05/2011: Adm with pulm edema/HTN urgency, EF 35-40% with diffuse hypokinesis and moderate to severe mitral regurgitation. Cardiomyopathy likely due to uncontrolled HTN and ETOH abuse - cath deferred due to renal insufficiency (felt due to uncontrolled HTN). bJodie Echevaria MV 06/2011: EF 37% and no ischemia or infarction. c. EF 45-50% by echo 01/2012.   Chronic sinusitis    CKD (chronic kidney disease)    a. 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  peak creatinine 3.46 during admx for aortic dissection 2/16.  sees Dr [name]   DDD (degenerative disc disease), lumbar    Depression    Descending thoracic aortic aneurysm [facility])    Dissecting aneurysm of thoracic aorta ([place])    ETOH abuse    a. Reported to have quit 05/2011.  Frequent headaches    GERD (gastroesophageal reflux disease)    Headache(784.0)    "q other day" (08/08/2013)   Heart murmur    Hemorrhoid thrombosis    History of echocardiogram    Echo 1/17:  Severe LVH, EF 55-60%, no RWMA, Gr 2 DD, AVR ok, mild to mod MR, mild LAE, mild reduced RVSF, mod RAE   History of medication noncompliance    HYPERLIPIDEMIA    Hypertension    a. Hx of HTN urgency secondary to noncompliance. b. urinary metanephrine and catecholeamine levels normal 2013.  c. Renal art [name] 1/16:   No evidence of renal artery stenosis noted bilaterally.   INGUINAL HERNIA    Pneumonia ~ 2013   Renal insufficiency    Serrated adenoma of colon 08/2015   Stroke [facility])    Tobacco abuse    Valvular heart disease    a. Echo 05/2011: moderate to severe eccentric MR and mild to moderate AI with prolapsing left coronary cusp. b. Echo 01/2012: mild-mod AI, mild dilitation of aortic root, mild MR.;  c. Echo 1/16: Severe LVH consistent with hypertrophic cardio myopathy, EF 50%, no RWMA, mod AI, mild MR, mild RAE, dilated Ao root (40 mm);     Past Surgical History:  Procedure Laterality Date   ANKLE SURGERY Bilateral    Fractures bilaterally   AORTIC VALVE SURGERY  09/2014   CORONARY ANGIOGRAPHY N/A 12/06/2017   Procedure: CORONARY ANGIOGRAPHY;  Surgeon: Belva Crome, MD;  [facility];  Service: Cardiovascular;  Laterality: N/A;   CORONARY ANGIOGRAPHY N/A 09/26/2018   Procedure: CORONARY ANGIOGRAPHY;  Surgeon: Nelva Bush, MD;  Location: [place];  Service: Cardiovascular;  Laterality: N/A;   CORONARY STENT INTERVENTION N/A 12/10/2017   Procedure: CORONARY STENT INTERVENTION;  Surgeon: [name] Sine, MD;  Location: [place] [facility];  Service: Cardiovascular;  Laterality: N/A;   FOOT FRACTURE SURGERY Bilateral 2004-2010   "got pins in both of them"   HEMORRHOID SURGERY N/A 06/15/2015   Procedure: HEMORRHOIDECTOMY;  Surgeon: Stark Klein, MD;  Location: [facility];  Service: General;  Laterality: N/A;   INGUINAL HERNIA REPAIR Right ~ [place] N/A 06/21/2014   Procedure: LEFT HEART CATHETERIZATION WITH CORONARY ANGIOGRAM;  Surgeon: Larey Dresser, MD;  Location: [facility] CATH LAB;  Service: Cardiovascular;  Laterality: N/A;   LOOP RECORDER INSERTION N/A 06/19/2016   Procedure: Loop Recorder Insertion;  Surgeon: Deboraha Sprang, MD;  Location: [place] [facility];  Service: Cardiovascular;  Laterality: N/A;   TEE WITHOUT CARDIOVERSION  N/A 03/12/2016   Procedure: TRANSESOPHAGEAL ECHOCARDIOGRAM (TEE);  Surgeon: Sanda Klein, MD;  Location: [facility] ENDOSCOPY;  Service: Cardiovascular;  Laterality: N/A;    Family Hx:  Family History  Problem Relation Age of Onset   Hypertension Mother    Hypertension Other    Colon cancer Paternal Uncle    Stroke Maternal Aunt    Heart attack Brother    Hypertension Brother    Diabetes Maternal Aunt    Lung cancer Maternal Uncle    Stroke Maternal Uncle    Other Sister        "breathing machine at night"   Headache Sister    Thyroid disease Sister    Throat cancer Neg Hx    Pancreatic cancer Neg Hx    Esophageal cancer Neg Hx    Kidney disease Neg Hx    Liver disease Neg Hx     Social  History:  reports that he has been smoking cigarettes. He has a 5.75 pack-year smoking history. He has never used smokeless tobacco. He reports current alcohol use of about 1.0 standard drink of alcohol per week. He reports current drug use. Drug: Marijuana.  Allergies:  Allergies  Allergen Reactions   Imdur [Isosorbide Nitrate] Other (See Comments)    Headaches    Tramadol Other (See Comments)    Headaches     Medications: Prior to Admission medications   Medication Sig Start Date End Date Taking? Authorizing Provider  acetaminophen (TYLENOL) 325 MG tablet Take 650 mg by mouth every 6 (six) hours as needed for moderate pain (for pain or headaches).   Yes [provider]  albuterol (VENTOLIN HFA) 108 (90 Base) MCG/ACT inhaler Inhale 2 puffs into the lungs every 6 (six) hours as needed for wheezing or shortness of breath.   Yes [provider]  amLODipine (NORVASC) 10 MG tablet Take 1 tablet (10 mg total) by mouth daily. 05/23/20  Yes Larey Dresser, MD  aspirin EC 81 MG EC tablet Take 1 tablet (81 mg total) by mouth daily. 12/12/17  Yes [facility], Tami Lin, PA  atorvastatin (LIPITOR) 80 MG tablet Take 1 tablet (80 mg total) by mouth daily. 05/19/20  Yes Georgette Shell, MD   Blood Pressure Monitor KIT 1 Device by Does not apply route 2 (two) times daily. 11/29/18  Yes Kerin Perna, NP  carvedilol (COREG) 25 MG tablet Take 25 mg by mouth 2 (two) times daily with a meal.   Yes [provider]  cloNIDine (CATAPRES - DOSED IN MG/24 HR) 0.3 mg/24hr patch Place 1 patch (0.3 mg total) onto the skin once a week. 09/28/18  Yes Arrien, Jimmy Picket, MD  doxazosin (CARDURA) 8 MG tablet TAKE 2 TABLETS(16 MG) BY MOUTH AT BEDTIME Patient taking differently: Take 16 mg by mouth at bedtime. 06/21/20  Yes Skeet Latch, MD  Evolocumab (REPATHA SURECLICK) 193 MG/ML SOAJ Inject 1 Dose into the skin every 14 (fourteen) days. 11/29/19  Yes Hilty, Nadean Corwin, MD  hydrALAZINE (APRESOLINE) 50 MG tablet Take 1.5 tablets (75 mg total) by mouth 3 (three) times daily. 08/30/20 08/30/21 Yes [place], Maricela Bo, FNP  lidocaine (LIDODERM) 5 % PLACE 1 PATCH ONTO THE SKIN EVERY 12 HOURS REMOVE AND DISCARD PATCH WITHIN 12 HOURS OR AS DIRECTED BY MD Patient taking differently: Place 1 patch onto the skin every 12 (twelve) hours. 07/23/20  Yes Lyda Jester M, PA-C  MINOXIDIL PO Take 20 mg by mouth daily.   Yes [provider]  pantoprazole (PROTONIX) 40 MG tablet Take 1 tablet (40 mg total) by mouth daily. 11/28/19  Yes Larey Dresser, MD  STIOLTO RESPIMAT 2.5-2.5 MCG/ACT AERS INHALE 2 PUFFS INTO THE LUNGS DAILY 09/06/19  Yes Spero Geralds, MD      Labs:  Results for orders placed or performed during the hospital encounter of 09/02/20 (from the past 48 hour(s))  TSH     Status: None   Collection Time: 09/05/20 11:50 AM  Result Value Ref Range   TSH 3.472 0.350 - 4.500 uIU/mL    Comment: Performed by a 3rd Generation assay with a functional sensitivity of <=0.01 uIU/mL. Performed at [facility], [place] [address] [place], [place] [address]   CBC     Status: Abnormal   Collection Time: 09/05/20 11:50 AM  Result Value Ref Range   WBC 3.2 (L) 4.0 - 10.5 K/uL    RBC 2.94 (  L) 4.22 - 5.81 MIL/uL   Hemoglobin 9.0 (L) 13.0 - 17.0 g/dL   HCT 27.0 (L) 39.0 - 52.0 %   MCV 91.8 80.0 - 100.0 fL   MCH 30.6 26.0 - 34.0 pg   MCHC 33.3 30.0 - 36.0 g/dL   RDW 12.7 11.5 - 15.5 %   Platelets 67 (L) 150 - 400 K/uL    Comment: Immature Platelet Fraction may be clinically indicated, consider ordering this additional test [id number] CONSISTENT WITH PREVIOUS RESULT REPEATED TO VERIFY    nRBC 0.0 0.0 - 0.2 %    Comment: Performed at [facility], [place] [address], [place], [place] [address]  Comprehensive metabolic panel     Status: Abnormal   Collection Time: 09/05/20 11:50 AM  Result Value Ref Range   Sodium 136 135 - 145 mmol/L   Potassium 4.3 3.5 - 5.1 mmol/L   Chloride 106 98 - 111 mmol/L   CO2 23 22 - 32 mmol/L   Glucose, Bld 125 (H) 70 - 99 mg/dL    Comment: Glucose reference range applies only to samples taken after fasting for at least 8 hours.   BUN 59 (H) 6 - 20 mg/dL   Creatinine, Ser 8.56 (H) 0.61 - 1.24 mg/dL   Calcium 8.8 (L) 8.9 - 10.3 mg/dL   Total Protein 5.5 (L) 6.5 - 8.1 g/dL   Albumin 3.0 (L) 3.5 - 5.0 g/dL   AST 19 15 - 41 U/L   ALT 14 0 - 44 U/L   Alkaline Phosphatase 59 38 - 126 U/L   Total Bilirubin 0.7 0.3 - 1.2 mg/dL   GFR, Estimated 7 (L) >60 mL/min    Comment: (NOTE) Calculated using the CKD-EPI Creatinine Equation (2021)    Anion gap 7 5 - 15    Comment: Performed at [facility], [place] [address] [place], [place] [address]  Glucose, capillary     Status: Abnormal   Collection Time: 09/05/20 12:44 PM  Result Value Ref Range   Glucose-Capillary 124 (H) 70 - 99 mg/dL    Comment: Glucose reference range applies only to samples taken after fasting for at least 8 hours.     ROS:  General: Denies fatigue fever sweats chills Eyes: Denies visual complaints blurred vision loss of vision Ears nose throat denies hearing loss epistaxis sore throat Cardiovascular: As per HPI Respiratory: No shortness of  breath Abdominal: No abdominal pain nausea vomiting bowels moving normally  Urogenital: No urgency frequency dysuria Neurologic: Admits to some confusion and intermittent. Dermatologic no skin rash no itching Endocrine no diabetes thyroid or adrenal disease  Physical Exam: Vitals:   09/06/20 0854 09/06/20 0855  BP:    Pulse:    Resp:    Temp:    SpO2: 99% 99%     General: Pleasant alert well-developed well-nourished gentleman nondistressed HEENT: Normocephalic atraumatic oropharynx was clear with moist mucous membranes Eyes: Pupils round equal reactive extraocular movements are intact Neck: JVP not elevated no thyromegaly appreciated Heart: Regular rate and rhythm faint systolic murmur 2/6 left sternal edge Lungs: Clear to auscultation no wheeze rales Abdomen: Soft nontender bowel sounds present Extremities: No cyanosis clubbing or edema Skin: Tattooed but no evidence of any bruises Neuro: Grossly intact.  Assessment/Plan: 1.Renal-stage V chronic kidney disease.  It appears that patient is going to need to have vascular access.  I discussed the case with vein and vascular surgery.  They are going to evaluate him.  At minimum we need to  have a catheter placed.  And start dialysis.  I would like a fistula placed prior to him being discharged as well.  We will call the clip office and have him evaluated for placement. 2. Hypertension/volume  -appears to be well controlled today on multiple medications. 3.  Anemia we will check iron studies. 4.  Bones will check PTH   Sherril Croon 09/06/2020, 10:40 AM

## 2020-09-06 NOTE — Consult Note (Addendum)
Hospital Consult    Reason for Consult:  dialysis access Requesting Physician:  TDC/permanent access MRN #:  [id number]  History of Present Illness: This is a 50 y.o. male with CKD V nearing ESRD in need of dialysis access.  He was admitted to the hospital about 4 days ago with chest pain.  He was evaluated by cardiology and this was felt to be non cardiac origin.    Pt has hx of CHF with EF of 60-65% in March 2022.  He states he has had two heart attacks and two strokes.  He states he has some residual left hand/arm numbness.    He has hx of AVR with bioprosthetic valve in 2016.   Pt has scar on abdomen and he states he had aneurysm fixed at the same time as his valve.  Appears to be thoracoabdominal incision.    His creatinine is worsening and [facility] is consulted for [facility] placement and permanent access.  When speaking to Dr. Justin Mend, it is felt that HD will be started once [facility] is placed.    Pt uses both hands but predominantly uses right.    The pt is on a statin for cholesterol management.  The pt is on a daily aspirin.   Other AC:  none The pt is on CCB, BB for hypertension.   The pt is not diabetic.   Tobacco hx:  current  Past Medical History:  Diagnosis Date   Adenomatous colon polyp 08/2015   Anxiety    Aortic disease ([place])    Aortic dissection (HCC)    a. admx 04/2014 >> L renal infarct; a/c renal failure >> b.  s/p Bioprosthetic [place] and total arch replacement and staged endovascular repair of descending aortic aneurysm ([facility] - Dr. [name])   CAD (coronary artery disease)    a. LHC 4/16:  oD1 60%   Cardiomyopathy ([place])    a. non-ischemic - probably related to untreated HTN and ETOH abuse - Echo 3/13 with EF 35-40% >> b. Echo 4/16: Severe LVH, EF 55-60%, moderate AI, moderate MR, mild LAE, trivial effusion, known type B dissection with communication between true and false lumens with suprasternal images suggesting dissection plane may propagate to at least left subclavian  takeoff, root above aortic valve okay     Chronic abdominal pain    Chronic combined systolic and diastolic congestive heart failure ([place])    a. 05/2011: Adm with pulm edema/HTN urgency, EF 35-40% with diffuse hypokinesis and moderate to severe mitral regurgitation. Cardiomyopathy likely due to uncontrolled HTN and ETOH abuse - cath deferred due to renal insufficiency (felt due to uncontrolled HTN). bJodie Echevaria MV 06/2011: EF 37% and no ischemia or infarction. c. EF 45-50% by echo 01/2012.   Chronic sinusitis    CKD (chronic kidney disease)    a. 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  peak creatinine 3.46 during admx for aortic dissection 2/16.  sees Dr [name]   DDD (degenerative disc disease), lumbar    Depression    Descending thoracic aortic aneurysm [facility])    Dissecting aneurysm of thoracic aorta ([place])    ETOH abuse    a. Reported to have quit 05/2011.   Frequent headaches    GERD (gastroesophageal reflux disease)    Headache(784.0)    "q other day" (08/08/2013)   Heart murmur    Hemorrhoid thrombosis    History of echocardiogram    Echo 1/17:  Severe LVH, EF 55-60%, no RWMA, Gr 2 DD, AVR ok, mild to mod  MR, mild LAE, mild reduced RVSF, mod RAE   History of medication noncompliance    HYPERLIPIDEMIA    Hypertension    a. Hx of HTN urgency secondary to noncompliance. b. urinary metanephrine and catecholeamine levels normal 2013.  c. Renal art [name] 1/16:  No evidence of renal artery stenosis noted bilaterally.   INGUINAL HERNIA    Pneumonia ~ 2013   Renal insufficiency    Serrated adenoma of colon 08/2015   Stroke [facility])    Tobacco abuse    Valvular heart disease    a. Echo 05/2011: moderate to severe eccentric MR and mild to moderate AI with prolapsing left coronary cusp. b. Echo 01/2012: mild-mod AI, mild dilitation of aortic root, mild MR.;  c. Echo 1/16: Severe LVH consistent with hypertrophic cardio myopathy, EF 50%, no RWMA, mod AI, mild MR, mild RAE, dilated Ao root (40 mm);     Past  Surgical History:  Procedure Laterality Date   ANKLE SURGERY Bilateral    Fractures bilaterally   AORTIC VALVE SURGERY  09/2014   CORONARY ANGIOGRAPHY N/A 12/06/2017   Procedure: CORONARY ANGIOGRAPHY;  Surgeon: Belva Crome, MD;  Location: [place] CV LAB;  Service: Cardiovascular;  Laterality: N/A;   CORONARY ANGIOGRAPHY N/A 09/26/2018   Procedure: CORONARY ANGIOGRAPHY;  Surgeon: Nelva Bush, MD;  Location: [place] CV LAB;  Service: Cardiovascular;  Laterality: N/A;   CORONARY STENT INTERVENTION N/A 12/10/2017   Procedure: CORONARY STENT INTERVENTION;  Surgeon: [name] Sine, MD;  Location: [place] CV LAB;  Service: Cardiovascular;  Laterality: N/A;   FOOT FRACTURE SURGERY Bilateral 2004-2010   "got pins in both of them"   HEMORRHOID SURGERY N/A 06/15/2015   Procedure: HEMORRHOIDECTOMY;  Surgeon: Stark Klein, MD;  Location: [place];  Service: General;  Laterality: N/A;   INGUINAL HERNIA REPAIR Right ~ [place] N/A 06/21/2014   Procedure: LEFT HEART CATHETERIZATION WITH CORONARY ANGIOGRAM;  Surgeon: Larey Dresser, MD;  Location: [facility] CATH LAB;  Service: Cardiovascular;  Laterality: N/A;   LOOP RECORDER INSERTION N/A 06/19/2016   Procedure: Loop Recorder Insertion;  Surgeon: Deboraha Sprang, MD;  Location: [place] CV LAB;  Service: Cardiovascular;  Laterality: N/A;   TEE WITHOUT CARDIOVERSION N/A 03/12/2016   Procedure: TRANSESOPHAGEAL ECHOCARDIOGRAM (TEE);  Surgeon: Sanda Klein, MD;  Location: [facility] ENDOSCOPY;  Service: Cardiovascular;  Laterality: N/A;    Allergies  Allergen Reactions   Imdur [Isosorbide Nitrate] Other (See Comments)    Headaches    Tramadol Other (See Comments)    Headaches     Prior to Admission medications   Medication Sig Start Date End Date Taking? Authorizing Provider  acetaminophen (TYLENOL) 325 MG tablet Take 650 mg by mouth every 6 (six) hours as needed for moderate pain (for pain or  headaches).   Yes [provider]  albuterol (VENTOLIN HFA) 108 (90 Base) MCG/ACT inhaler Inhale 2 puffs into the lungs every 6 (six) hours as needed for wheezing or shortness of breath.   Yes [provider]  amLODipine (NORVASC) 10 MG tablet Take 1 tablet (10 mg total) by mouth daily. 05/23/20  Yes Larey Dresser, MD  aspirin EC 81 MG EC tablet Take 1 tablet (81 mg total) by mouth daily. 12/12/17  Yes [facility], Tami Lin, PA  atorvastatin (LIPITOR) 80 MG tablet Take 1 tablet (80 mg total) by mouth daily. 05/19/20  Yes Georgette Shell, MD  Blood Pressure Monitor KIT 1 Device by  Does not apply route 2 (two) times daily. 11/29/18  Yes Kerin Perna, NP  carvedilol (COREG) 25 MG tablet Take 25 mg by mouth 2 (two) times daily with a meal.   Yes [provider]  cloNIDine (CATAPRES - DOSED IN MG/24 HR) 0.3 mg/24hr patch Place 1 patch (0.3 mg total) onto the skin once a week. 09/28/18  Yes Arrien, Jimmy Picket, MD  doxazosin (CARDURA) 8 MG tablet TAKE 2 TABLETS(16 MG) BY MOUTH AT BEDTIME Patient taking differently: Take 16 mg by mouth at bedtime. 06/21/20  Yes Skeet Latch, MD  Evolocumab (REPATHA SURECLICK) 974 MG/ML SOAJ Inject 1 Dose into the skin every 14 (fourteen) days. 11/29/19  Yes Hilty, Nadean Corwin, MD  hydrALAZINE (APRESOLINE) 50 MG tablet Take 1.5 tablets (75 mg total) by mouth 3 (three) times daily. 08/30/20 08/30/21 Yes [place], Maricela Bo, FNP  lidocaine (LIDODERM) 5 % PLACE 1 PATCH ONTO THE SKIN EVERY 12 HOURS REMOVE AND DISCARD PATCH WITHIN 12 HOURS OR AS DIRECTED BY MD Patient taking differently: Place 1 patch onto the skin every 12 (twelve) hours. 07/23/20  Yes Lyda Jester M, PA-C  MINOXIDIL PO Take 20 mg by mouth daily.   Yes [provider]  pantoprazole (PROTONIX) 40 MG tablet Take 1 tablet (40 mg total) by mouth daily. 11/28/19  Yes Larey Dresser, MD  STIOLTO RESPIMAT 2.5-2.5 MCG/ACT AERS INHALE 2 PUFFS INTO THE LUNGS DAILY 09/06/19   Yes Spero Geralds, MD    Social History   Socioeconomic History   Marital status: Married    Spouse name: Not on file   Number of children: 5   Years of education: 12   Highest education level: Not on file  Occupational History   Occupation: Disabled, part-time Programmer, systems    Comment: Disability  Tobacco Use   Smoking status: Some Days    Packs/day: 0.25    Years: 23.00    Pack years: 5.75    Types: Cigarettes   Smokeless tobacco: Never   Tobacco comments:    3 to 4 per day  Vaping Use   Vaping Use: Not on file  Substance and Sexual Activity   Alcohol use: Yes    Alcohol/week: 1.0 standard drink    Types: 1 Cans of beer per week    Comment: On special occasionally   Drug use: Yes    Types: Marijuana    Comment: 2 times/week   Sexual activity: Yes  Other Topics Concern   Not on file  Social History Narrative   Fun: Enjoy his children   Social Determinants of Radio broadcast assistant Strain: Not on file  Food Insecurity: Not on file  Transportation Needs: Not on file  Physical Activity: Not on file  Stress: Not on file  Social Connections: Not on file  Intimate Partner Violence: Not on file     Family History  Problem Relation Age of Onset   Hypertension Mother    Hypertension Other    Colon cancer Paternal Uncle    Stroke Maternal Aunt    Heart attack Brother    Hypertension Brother    Diabetes Maternal Aunt    Lung cancer Maternal Uncle    Stroke Maternal Uncle    Other Sister        "breathing machine at night"   Headache Sister    Thyroid disease Sister    Throat cancer Neg Hx    Pancreatic cancer Neg Hx    Esophageal cancer Neg  Hx    Kidney disease Neg Hx    Liver disease Neg Hx     ROS: '[x]'  Positive   '[ ]'  Negative   '[ ]'  All sytems reviewed and are negative  Cardiac: '[x]'  chest pain/pressure '[x]'  CHF '[x]'  hx AVR and thoracoabdominal aneurysm repair  Vascular: '[]'  pain in legs while walking '[]'  swelling in legs  Pulmonary: '[]'   asthma/wheezing '[]'  home O2  Neurologic: '[]'  hx of CVA  '[x]'  headache    Hematologic: '[]'  hx of cancer  Endocrine:   '[]'  diabetes '[]'  thyroid disease  GI '[x]'  GERD  GU: '[x]'  CKD/renal failure '[]'  HD--'[]'  M/W/F or '[]'  T/T/S  Psychiatric: '[]'  anxiety '[]'  depression  Musculoskeletal: '[x]'  DDD   Integumentary: '[]'  rashes '[]'  ulcers  Constitutional: '[]'  fever  '[]'  chills   Physical Examination  Vitals:   09/06/20 0854 09/06/20 0855  BP:    Pulse:    Resp:    Temp:    SpO2: 99% 99%   Body mass index is 21.2 kg/m.  General:  WDWN in NAD Gait: Not observed HENT: WNL, normocephalic Pulmonary: normal non-labored breathing Cardiac: regular, with harsh Murmur; with carotid bruits (most likely radiating from harsh heart murmur.  Well healed sternotomy incision Abdomen:  soft, NT/ND; aortic pulse is not palpable; thoracoabdominal incision present. Skin: without rashes Vascular Exam/Pulses:  Right Left  Radial 2+ (normal) 2+ (normal)  DP 2+ (normal) 2+ (normal)  PT Unable to palpate Unable to palpate   Extremities: without ischemic changes, without Gangrene , without cellulitis; without open wounds Musculoskeletal: no muscle wasting or atrophy  Neurologic: A&O X 3; speech is fluent/normal Psychiatric:  The pt has Normal affect.   CBC    Component Value Date/Time   WBC 3.2 (L) 09/05/2020 1150   RBC 2.94 (L) 09/05/2020 1150   HGB 9.0 (L) 09/05/2020 1150   HGB 12.2 (L) 06/22/2017 1220   HCT 27.0 (L) 09/05/2020 1150   HCT 38.1 06/22/2017 1220   PLT 67 (L) 09/05/2020 1150   PLT 156 06/22/2017 1220   MCV 91.8 09/05/2020 1150   MCV 93 06/22/2017 1220   MCH 30.6 09/05/2020 1150   MCHC 33.3 09/05/2020 1150   RDW 12.7 09/05/2020 1150   RDW 14.9 06/22/2017 1220   LYMPHSABS 1.3 12/02/2017 1457   LYMPHSABS 1.4 06/22/2017 1220   MONOABS 0.5 12/02/2017 1457   EOSABS 0.3 12/02/2017 1457   EOSABS 0.1 06/22/2017 1220   BASOSABS 0.0 12/02/2017 1457   BASOSABS 0.0 06/22/2017 1220     BMET    Component Value Date/Time   NA 136 09/05/2020 1150   NA 140 11/02/2019 0927   K 4.3 09/05/2020 1150   CL 106 09/05/2020 1150   CO2 23 09/05/2020 1150   GLUCOSE 125 (H) 09/05/2020 1150   BUN 59 (H) 09/05/2020 1150   BUN 27 (H) 11/02/2019 0927   CREATININE 8.56 (H) 09/05/2020 1150   CREATININE 1.52 (H) 06/08/2014 1132   CALCIUM 8.8 (L) 09/05/2020 1150   GFRNONAA 7 (L) 09/05/2020 1150   GFRAA 24 (L) 11/26/2019 0532    COAGS: Lab Results  Component Value Date   INR 1.1 05/17/2020   INR 1.09 01/31/2017   INR 1.07 03/04/2016     Non-Invasive Vascular Imaging:   BUE Vein Mapping  Ordered 09/06/2020   ASSESSMENT/PLAN: This is a 50 y.o. male with CKD V nearing ESRD in need of permanent dialysis access and TDC placement.  -Pt uses both hands but predominantly uses right.   He  has residual numbness in the left arm/hand from stroke.  Most likely for left arm AVF vs AVG. -may possibly be able to place [facility] tomorrow and possibly access if vein mapping is done at that time.  Cannot do this today as he has eaten recently.   -discussed AVF vs AVG with pt.  Discussed that it may not mature adequately and may need further procedures for this.  Also explained that HD access does not last forever and will most likely need intervention or new access in the future. We discussed steal syndrome from mild to severe symptoms.  He expressed understanding.  -will restrict left upper extremity at this time.   -npo after MN.   -Dr. Donzetta Matters to see pt this afternoon and determine when TDC/access can be done.     Leontine Locket, PA-C [facility] [phone]   I have independently interviewed and examined patient and agree with PA assessment and plan above.  Patient will need TDC and access.  I discussed with Dr. Justin Mend and will plan for 1 anesthetic on Monday with Dr. Oneida Alar.  If patient needs more urgent dialysis over the weekend I will be available to place tunneled catheter  and we can plan for fistula or graft on the left upper extremity early next week.  I discussed plan with the patient he will be n.p.o. past Sunday night.  Vein mapping pending  Suzana Sohail C. Donzetta Matters, MD [facility] [place] Office: [phone] Pager: [phone]

## 2020-09-06 NOTE — Consult Note (Signed)
Subjective:  50yo M with no sig past ocular hx notes blurry vision OD for past 2 days, lost his glasses. Of notes, PMH significant for CAD s/p stenting in 2019 and 2020, AAA repair in 2016 2/2 chronic dissection of multiple branches, CVA 2017, biosynthetic AVR 2016, CKD stage IV, refractory hypertension, chronic systolic CHF (with recovered LVEF 60 to 65% in March 2022), recurrent chest pains, presented with chest pains and currently admitted for cardiac work-up and likely initiation of HD.  Objective: VAsc (near): OD 20/40 initially but improved to 20/20 w/ blinking, OS 20/20 Pupils: miotic OU, 2.5-->1.7mm OU, ERRL, neg APD OU IOP (Tp): OD 30mmHg, OS: 69mmHg CVF: full to CF OU EOM: full OU  Anterior segment:  wnl OU except mild fluorescein staining of K c/w dryness/irregular tear film OD only  Dilated OU w/ 1% tropicamide/2.5% phenylephrine at 5:42pm  Dilated fundus exam: Vitreous clear OD, tiny nasal vitreous opacity OS, optic nerve pink/flat w/ c/d 0.4 OD and 0.2 OS, macula flat/wnl OU, vessels w/ arteriolar attenuation OU, periphery flat/attached OU  A/P: Blurry vision OD- 2/2 dry eye syndrome, recommend art tears OD 2-4x/day PRN, can f/u PRN with regular eye doctor as outpatient and to get updated Mrx/glasses Mild hypertensive retinopathy OU- cont HTN/BP control, no treatment indicated  Alroy Dust, MD

## 2020-09-06 NOTE — Progress Notes (Signed)
Renal Navigator received notification from Dr. Webb/Nephrologist that patient will have HD initiated during this hospitalization for ESRD and need referral for outpatient HD. Navigator will completed referral once there are labs, orders, etc to submit.  Navigator met with patient at bedside to introduce self and discuss outpatient HD treatment. He had a family member visiting with him that he gave permission to speak openly in front of. Navigator discussed the difference between starting in a traditional 3x per week clinic and the 4x per week Transitional Care Unit (TCU) at [facility]. He was pleasant and attentive. Navigator discussed the special attention and education new patients receive in the TCU and that this is the recommended course for new patients. Navigator also acknowledges that patient has been given a lot of information in a short amount of time. Patient agrees and asked to be able to think about his options and have Navigator follow up on Monday to discuss further. Navigator agreed. Patient reports that he has his own transportation and a great support system.   [name], [facility] Renal Navigator [phone]

## 2020-09-06 NOTE — Progress Notes (Signed)
PROGRESS NOTE  [name]  BMW:[id number] DOB: June 18, 1970 DOA: 09/02/2020 PCP: Kerin Perna, NP   Brief Narrative: [name] is a 50 y.o. 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   Patient has had multiple episodes of chest pain this year, all these episodes always considered not ACS etiology. This time, patient started to have left-sided chest pains, dull to pressure-like episodic, each episode last for 5 to 10 minutes, worsening with upper torso movement and left arm movement, relieved with sitting down.  Denied any associated symptoms such as feeling nauseous vomiting, palpitations or sweating.  She has been taking around-the-clock Tylenol to prevent this episode from happening however he still has 5/10 pain this morning, and came to ED. Trop 85>79>76, EKG showed LVH with secondary changes chronic.    Cardiology was consulted and assisted with medication titration. On 6/23 renal function was noted to have worsened, so nephrology consulted and is requesting vascular surgery consultation for [facility] and placement of AVF vs. AVG.   Assessment & Plan: Active Problems:   Chest pain, rule out acute myocardial infarction   Chest pain   Malnutrition of moderate degree  Blurry vision: Predominant right eye (not right visual fields) with relatively acute onset. CT head without new ischemic findings.  - Discussed with ophthalmology, Dr. Stacie Glaze, who will kindly evaluate the patient in the hospital.   Chest pain: Not typical of ACS with no ischemic ECG changes, though he has known CAD. Mild troponin elevation especially in light of severe renal impairment. This may be more flaring of chronic chest  pain he associates with open heart surgery for MVR/AAA Tx.  - Responsive to nitro, not narcotics > cardiology started low dose imdur 6/22, increasing to 20mg  today, also increase coreg to 50mg  po BID.  - Continue ASA, statin, beta blocker - Lidocaine patch, tylenol prn.    Chronic combined HFrEF: LVEF 2013 was 35-40% and has been up and down since, most recently normalized to 60-65% with G2DD in March 2022.  - Echo this admission with hyperdynamic LVEF >75%, G1DD, moderate biatrial enlargement, no pericardial effusion.  - Echo with speckled LVH > renal impairment not yet on HD precludes contrast (MRI or CT). Immunofixation negative. No further work up currently planned per cardiology  Resistant HTN:  - Continue clonidine patch, minoxidil, amlodipine, carvedilol, hydralazine, doxazosin. Coreg and imdur being titrated upward by cardiology.   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 VVS consulted, planning TDC and AVF vs. AVG.   Anemia of CKD: Seems to be progressive with worsening renal function. No evidence of bleeding.  - Continue monitoring. Will check FOBT.    Chronic systolic CHF -Euvolemic, no indication for diuresis.   Biosynthetic AVR and mitral valve - Appears to be stable, f/u echo.    HLD - Outpatient Repatha   History of CVA: s/p loop recorder.  - Follow up with cardiology for loop recorder management - Continue statin, repatha  Chronic thrombocytopenia - Stable, monitoring.  COPD: Quiescent.  - Continue BDs.   GERD:  - PPI  DVT prophylaxis: Heparin q12h Code Status: Full Family Communication: Uncle  at bedside Disposition Plan:  Status is: Inpatient.   Dispo: The patient is from: Home              Anticipated d/c is to: Home          Consultants:  Cardiology Nephrology Ophthalmology  Procedures:  None  Antimicrobials: None    Subjective: No chest pain. Wanted to leave AMA this morning but was notified of nephrology's recommendation to initiate dialysis and is overwhelmed but willing to stay. Right eye blurry vision is worse than left, constant but waxing and waning. No bleeding reported.   Objective: Vitals:   09/06/20 0740 09/06/20 0854 09/06/20 0855 09/06/20 1143  BP:    (!) 97/55  Pulse: 96   76  Resp: 19   16  Temp: 98 F (36.7 C)   97.7 F (36.5 C)  TempSrc: Oral   Oral  SpO2: 98% 99% 99% 99%  Weight:      Height:        Intake/Output Summary (Last 24 hours) at 09/06/2020 1235 Last data filed at 09/06/2020 1000 Gross per 24 hour  Intake 240 ml  Output 750 ml  Net -510 ml   Filed Weights   09/02/20 0043  Weight: 68.9 kg   Gen: 50 y.o. male in no distress HEENT: Ophthalmoscopic exam limited by miosis.  Pulm: Nonlabored breathing room air. Clear. CV: Regular rate and rhythm. 3/6 systolic murmur, now +S4, no rub appreciated. No pitting dependent edema. GI: Abdomen soft, non-tender, non-distended, with normoactive bowel sounds.  Ext: Warm, no deformities Skin: No new rashes, lesions or ulcers on visualized skin. Neuro: Alert and oriented. Left sided sensory impairment is stable, no new focal neurological deficits. Psych: Judgement and insight appear fair. Mood euthymic & affect congruent. Behavior is appropriate.    Data Reviewed: I have personally reviewed following labs and imaging studies  CBC: Recent Labs  Lab 09/02/20 0118 09/05/20 1150  WBC 4.6 3.2*  HGB 11.8* 9.0*  HCT 36.2* 27.0*  MCV 94.3 91.8  PLT 78* 67*   Basic Metabolic Panel: Recent Labs  Lab 09/02/20 0118 09/05/20 1150 09/06/20 1009  NA 142 136 136  K 4.4 4.3 4.0  CL 111 106 106  CO2 22 23 22   GLUCOSE 120* 125* 145*  BUN 51* 59* 65*  CREATININE 6.94* 8.56* 9.10*  CALCIUM 8.8* 8.8* 8.6*   GFR: Estimated Creatinine Clearance: 9.7 mL/min (A) (by C-G formula based on SCr of 9.1 mg/dL (H)).   Recent  Results (from the past 240 hour(s))  Resp Panel by RT-PCR (Flu A&B, Covid) Nasopharyngeal Swab     Status: None   Collection Time: 09/02/20  7:33 PM   Specimen: Nasopharyngeal Swab; Nasopharyngeal(NP) swabs in vial transport medium  Result Value Ref Range Status   SARS Coronavirus 2 by RT PCR NEGATIVE NEGATIVE Final    Comment: (NOTE) SARS-CoV-2 target nucleic acids are NOT DETECTED.  The SARS-CoV-2 RNA is generally detectable in upper respiratory specimens during the acute phase of infection. The lowest concentration of SARS-CoV-2 viral copies this assay can detect is 138 copies/mL. A negative result does not preclude SARS-Cov-2 infection and should not be used as the sole basis for treatment or other patient management decisions. A negative result may occur with  improper specimen collection/handling, submission of specimen other than nasopharyngeal swab, presence of viral mutation(s) within the areas targeted by this assay, and inadequate number of viral copies(<138 copies/mL). A negative result must be combined with clinical observations, patient history, and  epidemiological information. The expected result is Negative.  Fact Sheet for Patients:  EntrepreneurPulse.com.au  Fact Sheet for Healthcare Providers:  IncredibleEmployment.be  This test is no t yet approved or cleared by the [place] FDA and  has been authorized for detection and/or diagnosis of SARS-CoV-2 by FDA under an Emergency Use Authorization (EUA). This EUA will remain  in effect (meaning this test can be used) for the duration of the COVID-19 declaration under Section 564(b)(1) of the Act, 21 U.S.C.section 360bbb-3(b)(1), unless the authorization is terminated  or revoked sooner.       Influenza A by PCR NEGATIVE NEGATIVE Final   Influenza B by PCR NEGATIVE NEGATIVE Final    Comment: (NOTE) The Xpert Xpress SARS-CoV-2/FLU/RSV plus assay is intended as an aid in the  diagnosis of influenza from Nasopharyngeal swab specimens and should not be used as a sole basis for treatment. Nasal washings and aspirates are unacceptable for Xpert Xpress SARS-CoV-2/FLU/RSV testing.  Fact Sheet for Patients: EntrepreneurPulse.com.au  Fact Sheet for Healthcare Providers: IncredibleEmployment.be  This test is not yet approved or cleared by the [place] FDA and has been authorized for detection and/or diagnosis of SARS-CoV-2 by FDA under an Emergency Use Authorization (EUA). This EUA will remain in effect (meaning this test can be used) for the duration of the COVID-19 declaration under Section 564(b)(1) of the Act, 21 U.S.C. section 360bbb-3(b)(1), unless the authorization is terminated or revoked.  Performed at [facility], [place] [address], [place], [place] [address]       Radiology Studies: CT HEAD WO CONTRAST  Result Date: 09/05/2020 CLINICAL DATA:  Neuro deficit, acute, stroke suspected acute blurry vision in right eye, hx CVA, no other deficits EXAM: CT HEAD WITHOUT CONTRAST TECHNIQUE: Contiguous axial images were obtained from the base of the skull through the vertex without intravenous contrast. COMPARISON:  Head CT 12/02/2017 FINDINGS: Brain: Remote left parietal infarct is unchanged from prior imaging. No evidence of acute ischemia. Normal brain volume with minor periventricular chronic small vessel ischemia. No intracranial hemorrhage, mass effect, or midline shift. No hydrocephalus. The basilar cisterns are patent. No extra-axial or intracranial fluid collection. Vascular: No hyperdense vessel. Skull: No fracture or focal lesion. Sinuses/Orbits: Mucosal thickening throughout the ethmoid air cells and left frontal sinus. Mucous retention cysts in the maxillary sinuses. Remote right orbital fracture. No acute orbital findings Other: None. IMPRESSION: 1. No acute intracranial abnormality. 2. Remote left parietal  infarct. 3. Remote right orbital wall fracture. Electronically Signed   By: Keith Rake M.D.   On: 09/05/2020 18:53    Scheduled Meds:  amLODipine  10 mg Oral Daily   arformoterol  15 mcg Nebulization BID   And   umeclidinium bromide  1 puff Inhalation Daily   aspirin  81 mg Oral Daily   atorvastatin  80 mg Oral Daily   carvedilol  50 mg Oral BID WC   cloNIDine  0.3 mg Transdermal Weekly   doxazosin  16 mg Oral QHS   feeding supplement  237 mL Oral BID BM   heparin  5,000 Units Subcutaneous Q12H   hydrALAZINE  75 mg Oral TID   isosorbide mononitrate  30 mg Oral Daily   kidney failure book   Does not apply Once   lidocaine  1 patch Transdermal Q24H   minoxidil  20 mg Oral Daily   multivitamin  1 tablet Oral QHS   pantoprazole  40 mg Oral Daily   Continuous Infusions:   LOS: 0 days  Time spent: 35 minutes.  Patrecia Pour, MD [facility] [email or web address] 09/06/2020, 12:35 PM

## 2020-09-06 NOTE — Progress Notes (Addendum)
Initial Nutrition Assessment  DOCUMENTATION CODES:   Non-severe (moderate) malnutrition in context of chronic illness  INTERVENTION:   Ensure Enlive po BID, each supplement provides 350 kcal and 20 grams of protein. Since K is WNL, will use Ensure supplement. Can change supplement to low K/low Phos in the future if K or Phos become elevated. Renal MVI daily.  NUTRITION DIAGNOSIS:   Moderate Malnutrition related to chronic illness (CKD) as evidenced by mild muscle depletion, mild fat depletion, percent weight loss (7.5% weight loss within 3 months).  GOAL:   Patient will meet greater than or equal to 90% of their needs  MONITOR:   PO intake, Supplement acceptance, Labs  REASON FOR ASSESSMENT:   Malnutrition Screening Tool    ASSESSMENT:   50 yo male admitted with chest pain, severe LVH. PMH includes CAD, AAA repair, CVA, biosynthetic AVR, CKD stage IV, HTN, CHF, tobacco use.  Creatinine rising. Nephrology consulting today. Plans to begin HD this admission. Patient will need access. Patient is not ready for diet education at this time.   Patient c/o poor appetite for several months. He has been eating 50-75% of his usual intake for 2-3 months. Wife states that patient drinks Ensure at home if his intake is poor.  Currently on a renal diet.  Meal intakes: 70% of breakfast today  Labs reviewed. Creatinine 8.56, BUN 59, K 4.3 WNL  Medications reviewed and include protonix.   Weight history reviewed. Weight is down by ~7.5% over the past 3 months.  Patient meets criteria for moderate malnutrition with mild depletion of muscle and subcutaneous fat mass, and 7.5% weight loss within 3 months.  NUTRITION - FOCUSED PHYSICAL EXAM:  Flowsheet Row Most Recent Value  Orbital Region No depletion  Upper Arm Region No depletion  Thoracic and Lumbar Region Mild depletion  Buccal Region Mild depletion  Temple Region No depletion  Clavicle Bone Region Mild depletion  Clavicle and  Acromion Bone Region Mild depletion  Scapular Bone Region Mild depletion  Dorsal Hand Mild depletion  Patellar Region Moderate depletion  Anterior Thigh Region Moderate depletion  Posterior Calf Region Moderate depletion  Edema (RD Assessment) None  Hair Reviewed  Eyes Reviewed  Mouth Reviewed  Skin Reviewed  Nails Reviewed       Diet Order:   Diet Order             Diet renal with fluid restriction Fluid restriction: 1200 mL Fluid; Room service appropriate? Yes; Fluid consistency: Thin  Diet effective now                   EDUCATION NEEDS:   Not appropriate for education at this time  Skin:  Skin Assessment: Reviewed RN Assessment  Last BM:  6/22  Height:   Ht Readings from Last 1 Encounters:  09/02/20 5\' 11"  (6.440 m)    Weight:   Wt Readings from Last 1 Encounters:  09/02/20 68.9 kg    Ideal Body Weight:  78.2 kg  BMI:  Body mass index is 21.2 kg/m.  Estimated Nutritional Needs:   Kcal:  2200-2400  Protein:  85-100 gm  Fluid:  2-2.2 L    Lucas Mallow, RD, LDN, CNSC Please refer to Amion for contact information.

## 2020-09-06 NOTE — Progress Notes (Signed)
Rounded on patient today in correlation to transition to outpatient HD. Patient's nephew at the bedside. Granted permission to speak in front of the nephew. Patient has already spoken with dietician. Kidney Failure book ordered. Patient educated at the bedside regarding care of tunneled dialysis catheter, AV fistula/graft site care, assessment of thrill daily and proper medication administration on HD days.  Patient also educated on the importance of adhering to scheduled dialysis treatments, the effects of fluid overload, hyperkalemia and hyperphosphatemia. Patient capable of re-verbalizing via teach back method, but verbalizes that this is a lot to take in. This RN is empathetic to patient's feelings. Plan to round on patient Monday. Handouts and contact information provided to patient for any further assistance.  Dorthey Sawyer, RN  Dialysis Nurse Coordinator Phone: [phone]

## 2020-09-06 NOTE — Progress Notes (Addendum)
Progress Note  Patient Name: [name] Date of Encounter: 09/06/2020  [facility] Cardiologist: [name], MD   Subjective   Pt feels much better, no further CP or headache. He wants to go home.  Inpatient Medications    Scheduled Meds:  amLODipine  10 mg Oral Daily   arformoterol  15 mcg Nebulization BID   And   umeclidinium bromide  1 puff Inhalation Daily   aspirin  81 mg Oral Daily   atorvastatin  80 mg Oral Daily   carvedilol  50 mg Oral BID WC   cloNIDine  0.3 mg Transdermal Weekly   doxazosin  16 mg Oral QHS   heparin  5,000 Units Subcutaneous Q12H   hydrALAZINE  75 mg Oral TID   isosorbide mononitrate  30 mg Oral Daily   lidocaine  1 patch Transdermal Q24H   minoxidil  20 mg Oral Daily   pantoprazole  40 mg Oral Daily   Continuous Infusions:  PRN Meds: acetaminophen, albuterol, nitroGLYCERIN, oxyCODONE, prochlorperazine   Vital Signs    Vitals:   09/06/20 0626 09/06/20 0740 09/06/20 0854 09/06/20 0855  BP: (!) 107/58     Pulse: 87 96    Resp: 20 19    Temp: 97.7 F (36.5 C) 98 F (36.7 C)    TempSrc: Oral Oral    SpO2: 97% 98% 99% 99%  Weight:      Height:        Intake/Output Summary (Last 24 hours) at 09/06/2020 0923 Last data filed at 09/05/2020 1544 Gross per 24 hour  Intake --  Output 500 ml  Net -500 ml   Last 3 Weights 09/02/2020 08/30/2020 07/23/2020  Weight (lbs) 152 lb 152 lb 12.8 oz 158 lb 12.8 oz  Weight (kg) 68.947 kg 69.31 kg 72.031 kg      Telemetry    Sinus rhythm HR 80-90s - Personally Reviewed  ECG    No new tracings - Personally Reviewed  Physical Exam   GEN: No acute distress.   Neck: No JVD Cardiac: RRR, 5/6 systolic murmur with valve click Respiratory: Clear to auscultation bilaterally. GI: Soft, nontender, non-distended  MS: No edema; No deformity. Neuro:  Nonfocal  Psych: Normal affect   Labs    High Sensitivity Troponin:   Recent Labs  Lab 09/02/20 0118 09/02/20 0308 09/02/20 0840   TROPONINIHS 85* 79* 76*      Chemistry Recent Labs  Lab 08/30/20 1037 09/02/20 0118 09/05/20 1150  NA 139 142 136  K 4.6 4.4 4.3  CL 110 111 106  CO2 21* 22 23  GLUCOSE 94 120* 125*  BUN 52* 51* 59*  CREATININE 6.53* 6.94* 8.56*  CALCIUM 8.9 8.8* 8.8*  PROT  --   --  5.5*  ALBUMIN  --   --  3.0*  AST  --   --  19  ALT  --   --  14  ALKPHOS  --   --  59  BILITOT  --   --  0.7  GFRNONAA 10* 9* 7*  ANIONGAP 8 9 7      Hematology Recent Labs  Lab 09/02/20 0118 09/05/20 1150  WBC 4.6 3.2*  RBC 3.84* 2.94*  HGB 11.8* 9.0*  HCT 36.2* 27.0*  MCV 94.3 91.8  MCH 30.7 30.6  MCHC 32.6 33.3  RDW 13.7 12.7  PLT 78* 67*    BNPNo results for input(s): BNP, PROBNP in the last 168 hours.   DDimer No results for input(s): DDIMER  in the last 168 hours.   Radiology    CT HEAD WO CONTRAST  Result Date: 09/05/2020 CLINICAL DATA:  Neuro deficit, acute, stroke suspected acute blurry vision in right eye, hx CVA, no other deficits EXAM: CT HEAD WITHOUT CONTRAST TECHNIQUE: Contiguous axial images were obtained from the base of the skull through the vertex without intravenous contrast. COMPARISON:  Head CT 12/02/2017 FINDINGS: Brain: Remote left parietal infarct is unchanged from prior imaging. No evidence of acute ischemia. Normal brain volume with minor periventricular chronic small vessel ischemia. No intracranial hemorrhage, mass effect, or midline shift. No hydrocephalus. The basilar cisterns are patent. No extra-axial or intracranial fluid collection. Vascular: No hyperdense vessel. Skull: No fracture or focal lesion. Sinuses/Orbits: Mucosal thickening throughout the ethmoid air cells and left frontal sinus. Mucous retention cysts in the maxillary sinuses. Remote right orbital fracture. No acute orbital findings Other: None. IMPRESSION: 1. No acute intracranial abnormality. 2. Remote left parietal infarct. 3. Remote right orbital wall fracture. Electronically Signed   By: Keith Rake M.D.   On: 09/05/2020 18:53    Cardiac Studies   Echo 09/03/20:  1. 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 S/P AVR with  elevated mean gradient of 33.5 mmHg (similar to gradient of 31 mmHg on  05/18/20 study).   2. Left ventricular ejection fraction, by estimation, is >75%. The left  ventricle has hyperdynamic function. The left ventricle has no regional  wall motion abnormalities. Left ventricular diastolic parameters are  consistent with Grade I diastolic  dysfunction (impaired relaxation). Elevated left atrial pressure.   3. Right ventricular systolic function is normal. The right ventricular  size is normal.   4. Left atrial size was moderately dilated.   5. Right atrial size was moderately dilated.   6. The mitral valve is normal in structure. Trivial mitral valve  regurgitation. No evidence of mitral stenosis.   7. The aortic valve has been repaired/replaced. Aortic valve  regurgitation is not visualized. Moderate aortic valve stenosis. There is  a bioprosthetic valve present in the aortic position.   Patient Profile     50 y.o. 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   Assessment & Plan    Persistent chest pain Known CAD - hs troponin low and flat - CKD stage IV-V - inflammatory markers are WNL makign pericarditis/myocarditis less likely - echo with grade 1DD, hyperdynamic LVEF, moderate biatrial enlargement - has been intolerant to imdur in the past - tender to palpation in left upper chest   Cardiomyopathy  Chronic combined systolic and diastolic heart failure - EF hyperdynamic with grade 1 DD - previously EF was 35-40% in 2013, then normalized in 2016, reduced to 35-40% in 2019,  normalized again in 05/2020 - GDMT limited by renal disease - does not appear volume up - echo with concern for speckled appearance, PYP scan in 2020 negative for amyloid - suspect role for prior ETOH use in CM   Hypertension - continue amlodipine, hydralazine, clonidine, doxazosin, coreg - started 15 mg imdur --> increased to 30 mg   CKD stage IV-V - sCr has risen to 8.56 from 6.5-6.9 - baseline appears near 4 - I have repeated a stat BMP - I have contacted  Dr. Justin Mend who will see him today, appreciate his recommendations   AAA s/p [place] - 11/2014 at [facility] bioprosthetic aortic valve - last evaluation with CTA 11/2019   Hx of CVA - s/p ILR in place - non transmission since 01/2020   PT has follow up scheduled.     For questions or updates, please contact [place] Please consult [email or web address] for contact info under        Signed, Ledora Bottcher, PA  09/06/2020, 9:23 AM     Patient seen and examined. Agree with assessment and plan.  Clinically, patient feels improved.  He is not having a residual chest pain.  Heart rate is much better controlled now on carvedilol 50 mg twice a day.  He is on nitrates/hydralazine and appears to be tolerating this.  In addition he is on minoxidil, doxazosin, and clonidine.  He has severe LVH and suggestion of a speckled pattern but apparently had a negative work-up with a technetium pyrophosphate scan and myeloma work-up remotely.  He has not been able to have a cardiac MRI due to his renal issues.  Repeat creatinine today is further increased from 6.94 to 8.56.  We have contacted nephrology and Dr. Justin Mend will see.  Cardiac wise he appears stable.  He had a head CT yesterday which did not show acute intracranial abnormality.  There was evidence for remote left parietal infarct as well as remote right orbital wall fracture.   [name] Sine, MD, [facility] 09/06/2020 9:50 AM

## 2020-09-07 DIAGNOSIS — Z992 Dependence on renal dialysis: Secondary | ICD-10-CM

## 2020-09-07 DIAGNOSIS — N186 End stage renal disease: Secondary | ICD-10-CM

## 2020-09-07 DIAGNOSIS — E440 Moderate protein-calorie malnutrition: Secondary | ICD-10-CM

## 2020-09-07 DIAGNOSIS — I429 Cardiomyopathy, unspecified: Secondary | ICD-10-CM

## 2020-09-07 LAB — RENAL FUNCTION PANEL
Albumin: 2.9 g/dL — ABNORMAL LOW (ref 3.5–5.0)
Anion gap: 7 (ref 5–15)
BUN: 63 mg/dL — ABNORMAL HIGH (ref 6–20)
CO2: 23 mmol/L (ref 22–32)
Calcium: 8.7 mg/dL — ABNORMAL LOW (ref 8.9–10.3)
Chloride: 106 mmol/L (ref 98–111)
Creatinine, Ser: 9.58 mg/dL — ABNORMAL HIGH (ref 0.61–1.24)
GFR, Estimated: 6 mL/min — ABNORMAL LOW (ref >60)
Glucose, Bld: 107 mg/dL — ABNORMAL HIGH (ref 70–99)
Phosphorus: 5.3 mg/dL — ABNORMAL HIGH (ref 2.5–4.6)
Potassium: 4 mmol/L (ref 3.5–5.1)
Sodium: 136 mmol/L (ref 135–145)

## 2020-09-07 LAB — PARATHYROID HORMONE, INTACT (NO CA): PTH: 132 pg/mL — ABNORMAL HIGH (ref 15–65)

## 2020-09-07 MED ORDER — OXYCODONE HCL 5 MG PO TABS
5.0000 mg | ORAL_TABLET | ORAL | Status: DC | PRN
  Administered 2020-09-07 – 2020-09-10 (×10): 5 mg via ORAL
  Filled 2020-09-07 (×11): qty 1

## 2020-09-07 MED ORDER — DARBEPOETIN ALFA 40 MCG/0.4ML IJ SOSY
40.0000 ug | PREFILLED_SYRINGE | INTRAMUSCULAR | Status: DC
  Administered 2020-09-07: 40 ug via SUBCUTANEOUS
  Filled 2020-09-07: qty 0.4

## 2020-09-07 MED ORDER — CLONIDINE HCL 0.1 MG/24HR TD PTWK: 0.1000 mg | MEDICATED_PATCH | TRANSDERMAL | Status: DC

## 2020-09-07 NOTE — Progress Notes (Signed)
[facility] ROUNDING NOTE   Subjective:   Interval History: [name] is a 50 y.o. male.  Admitted 09/02/2020.  This is a gentleman with chronic kidney disease followed by Dr. Joelyn Oms at [facility].  He has been diagnosed with stage V chronic kidney disease and was last seen several weeks ago by the physician assistant.  This was after a significant time of being lost to follow-up.  It was found that his creatinine is about 6 to 8 mg/dL.  Remarkably he is displaying no uremic signs or symptoms.  He presented with chest pain.  He has been evaluated by cardiology.  It is felt the chest pain is noncardiac in origin.  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  His refractory hypertension.  He is to undergo placement of AV fistula and catheter 09/09/2020 appreciate assistance of Dr. Siri Cole.  Blood pressure 98/59 pulse 70 temperature 98.1 O2 sats 95% 2 L nasal cannula.  Urine output 450 cc  Sodium 136 potassium 4 chloride 106 CO2 23 BUN 63 creatinine 9.58 glucose 107 calcium 8.7 phosphorus 5.3 albumin 2.9 iron saturations 40% hemoglobin 9.0   Objective:  Vital signs in last 24 hours:  Temp:  [97.6 F (36.4 C)-98.1 F (36.7 C)] 98.1 F (36.7 C) (06/25 0500) Pulse Rate:  [71-84] 71 (06/25 1100) Resp:  [16-19] 16 (06/25 1100) BP: (97-123)/(55-72) 98/59 (06/25 1100) SpO2:  [94 %-100 %] 94 % (06/25 0905) FiO2 (%):  [21 %] 21 % (06/25 0905)  Weight change:  Filed Weights   09/02/20 0043  Weight: 68.9 kg    Intake/Output: I/O last 3 completed shifts: In: 960 [P.O.:960] Out: 450 [Urine:450]   Intake/Output this shift:  No intake/output data recorded.  General: Pleasant alert well-developed well-nourished gentleman nondistressed HEENT: Normocephalic atraumatic oropharynx was clear with moist mucous membranes Eyes:  Pupils round equal reactive extraocular movements are intact Neck: JVP not elevated no thyromegaly appreciated Heart: Regular rate and rhythm faint systolic murmur 2/6 left sternal edge Lungs: Clear to auscultation no wheeze rales Abdomen: Soft nontender bowel sounds present Extremities: No cyanosis clubbing or edema Skin: Tattooed but no evidence of any bruises Neuro: Grossly intact.   Basic Metabolic Panel: Recent Labs  Lab 09/02/20 0118 09/05/20 1150 09/06/20 1009 09/07/20 0206  NA 142 136 136 136  K 4.4 4.3 4.0 4.0  CL 111 106 106 106  CO2 22 23 22 23   GLUCOSE 120* 125* 145* 107*  BUN 51* 59* 65* 63*  CREATININE 6.94* 8.56* 9.10* 9.58*  CALCIUM 8.8* 8.8* 8.6* 8.7*  PHOS  --   --   --  5.3*    Liver Function Tests: Recent Labs  Lab 09/05/20 1150 09/07/20 0206  AST 19  --   ALT 14  --   ALKPHOS 59  --   BILITOT 0.7  --   PROT 5.5*  --   ALBUMIN 3.0* 2.9*   No results for input(s): LIPASE, AMYLASE in the last 168 hours. No results for input(s): AMMONIA in the last 168 hours.  CBC: Recent Labs  Lab 09/02/20 0118 09/05/20 1150  WBC 4.6 3.2*  HGB 11.8* 9.0*  HCT 36.2* 27.0*  MCV 94.3 91.8  PLT 78* 67*    Cardiac Enzymes: No results for input(s): CKTOTAL, CKMB, CKMBINDEX, TROPONINI in the last 168 hours.  BNP: Invalid input(s): POCBNP  CBG: Recent  Labs  Lab 09/05/20 1244  GLUCAP 124*    Microbiology: Results for orders placed or performed during the hospital encounter of 09/02/20  Resp Panel by RT-PCR (Flu A&B, Covid) Nasopharyngeal Swab     Status: None   Collection Time: 09/02/20  7:33 PM   Specimen: Nasopharyngeal Swab; Nasopharyngeal(NP) swabs in vial transport medium  Result Value Ref Range Status   SARS Coronavirus 2 by RT PCR NEGATIVE NEGATIVE Final    Comment: (NOTE) SARS-CoV-2 target nucleic acids are NOT DETECTED.  The SARS-CoV-2 RNA is generally detectable in upper respiratory specimens during the acute phase of infection. The  lowest concentration of SARS-CoV-2 viral copies this assay can detect is 138 copies/mL. A negative result does not preclude SARS-Cov-2 infection and should not be used as the sole basis for treatment or other patient management decisions. A negative result may occur with  improper specimen collection/handling, submission of specimen other than nasopharyngeal swab, presence of viral mutation(s) within the areas targeted by this assay, and inadequate number of viral copies(<138 copies/mL). A negative result must be combined with clinical observations, patient history, and epidemiological information. The expected result is Negative.  Fact Sheet for Patients:  EntrepreneurPulse.com.au  Fact Sheet for Healthcare Providers:  IncredibleEmployment.be  This test is no t yet approved or cleared by the [place] FDA and  has been authorized for detection and/or diagnosis of SARS-CoV-2 by FDA under an Emergency Use Authorization (EUA). This EUA will remain  in effect (meaning this test can be used) for the duration of the COVID-19 declaration under Section 564(b)(1) of the Act, 21 U.S.C.section 360bbb-3(b)(1), unless the authorization is terminated  or revoked sooner.       Influenza A by PCR NEGATIVE NEGATIVE Final   Influenza B by PCR NEGATIVE NEGATIVE Final    Comment: (NOTE) The Xpert Xpress SARS-CoV-2/FLU/RSV plus assay is intended as an aid in the diagnosis of influenza from Nasopharyngeal swab specimens and should not be used as a sole basis for treatment. Nasal washings and aspirates are unacceptable for Xpert Xpress SARS-CoV-2/FLU/RSV testing.  Fact Sheet for Patients: EntrepreneurPulse.com.au  Fact Sheet for Healthcare Providers: IncredibleEmployment.be  This test is not yet approved or cleared by the [place] FDA and has been authorized for detection and/or diagnosis of SARS-CoV-2 by FDA under  an Emergency Use Authorization (EUA). This EUA will remain in effect (meaning this test can be used) for the duration of the COVID-19 declaration under Section 564(b)(1) of the Act, 21 U.S.C. section 360bbb-3(b)(1), unless the authorization is terminated or revoked.  Performed at [facility], [place] [address], [place], [place] [address]     Coagulation Studies: No results for input(s): LABPROT, INR in the last 72 hours.  Urinalysis: No results for input(s): COLORURINE, LABSPEC, PHURINE, GLUCOSEU, HGBUR, BILIRUBINUR, KETONESUR, PROTEINUR, UROBILINOGEN, NITRITE, LEUKOCYTESUR in the last 72 hours.  Invalid input(s): APPERANCEUR    Imaging: CT HEAD WO CONTRAST  Result Date: 09/05/2020 CLINICAL DATA:  Neuro deficit, acute, stroke suspected acute blurry vision in right eye, hx CVA, no other deficits EXAM: CT HEAD WITHOUT CONTRAST TECHNIQUE: Contiguous axial images were obtained from the base of the skull through the vertex without intravenous contrast. COMPARISON:  Head CT 12/02/2017 FINDINGS: Brain: Remote left parietal infarct is unchanged from prior imaging. No evidence of acute ischemia. Normal brain volume with minor periventricular chronic small vessel ischemia. No intracranial hemorrhage, mass effect, or midline shift. No hydrocephalus. The basilar cisterns are patent. No extra-axial or intracranial fluid collection. Vascular:  No hyperdense vessel. Skull: No fracture or focal lesion. Sinuses/Orbits: Mucosal thickening throughout the ethmoid air cells and left frontal sinus. Mucous retention cysts in the maxillary sinuses. Remote right orbital fracture. No acute orbital findings Other: None. IMPRESSION: 1. No acute intracranial abnormality. 2. Remote left parietal infarct. 3. Remote right orbital wall fracture. Electronically Signed   By: Keith Rake M.D.   On: 09/05/2020 18:53   VAS [name] UPPER EXT VEIN MAPPING (PRE-OP AVF)  Result Date: 09/06/2020 UPPER EXTREMITY VEIN MAPPING  Patient Name:  AJAHNI NAY  Date of Exam:   09/06/2020 Medical Rec #: [id number]      Accession #:    [id number] Date of Birth: 10-Feb-1971       Patient Gender: M Patient Age:   67Y Exam Location:  [facility] Procedure:      VAS [name] UPPER EXT VEIN MAPPING (PRE-OP AVF) Referring Phys: [id number] SAMANTHA J RHYNE --------------------------------------------------------------------------------  Indications: Pre-access. Comparison Study: No prior Performing Technologist: Oda Cogan RDMS, RVT  Examination Guidelines: A complete evaluation includes B-mode imaging, spectral Doppler, color Doppler, and power Doppler as needed of all accessible portions of each vessel. Bilateral testing is considered an integral part of a complete examination. Limited examinations for reoccurring indications may be performed as noted. +-----------------+-------------+----------+--------------+ Right Cephalic   Diameter (cm)Depth (cm)   Findings    +-----------------+-------------+----------+--------------+ Shoulder                                not visualized +-----------------+-------------+----------+--------------+ Prox upper arm                          not visualized +-----------------+-------------+----------+--------------+ Mid upper arm                           not visualized +-----------------+-------------+----------+--------------+ Dist upper arm                          not visualized +-----------------+-------------+----------+--------------+ Antecubital fossa                       not visualized +-----------------+-------------+----------+--------------+ Prox forearm                            not visualized +-----------------+-------------+----------+--------------+ Mid forearm          0.21                 branching    +-----------------+-------------+----------+--------------+ Dist forearm         0.21                               +-----------------+-------------+----------+--------------+ Wrist                0.20                              +-----------------+-------------+----------+--------------+ +-----------------+-------------+----------+--------+ Right Basilic    Diameter (cm)Depth (cm)Findings +-----------------+-------------+----------+--------+ Shoulder             0.44                        +-----------------+-------------+----------+--------+ Prox upper arm  0.44                        +-----------------+-------------+----------+--------+ Mid upper arm        0.37                        +-----------------+-------------+----------+--------+ Dist upper arm       0.45                        +-----------------+-------------+----------+--------+ Antecubital fossa    0.41                        +-----------------+-------------+----------+--------+ Prox forearm         0.27                        +-----------------+-------------+----------+--------+ Mid forearm          0.18                        +-----------------+-------------+----------+--------+ Distal forearm       0.13                        +-----------------+-------------+----------+--------+ Wrist                0.17                        +-----------------+-------------+----------+--------+ +-----------------+-------------+----------+--------+ Left Cephalic    Diameter (cm)Depth (cm)Findings +-----------------+-------------+----------+--------+ Prox upper arm       0.20                        +-----------------+-------------+----------+--------+ Mid upper arm        0.12                        +-----------------+-------------+----------+--------+ Dist upper arm       0.18                        +-----------------+-------------+----------+--------+ Antecubital fossa    0.19                        +-----------------+-------------+----------+--------+ Prox forearm         0.17                         +-----------------+-------------+----------+--------+ Mid forearm          0.18                        +-----------------+-------------+----------+--------+ Dist forearm         0.15                        +-----------------+-------------+----------+--------+ Wrist                0.17                        +-----------------+-------------+----------+--------+ +-----------------+-------------+----------+--------------+ Left Basilic     Diameter (cm)Depth (cm)   Findings    +-----------------+-------------+----------+--------------+ Prox upper arm       0.49                              +-----------------+-------------+----------+--------------+  Mid upper arm        0.36                              +-----------------+-------------+----------+--------------+ Dist upper arm       0.35                              +-----------------+-------------+----------+--------------+ Antecubital fossa    0.29                 branching    +-----------------+-------------+----------+--------------+ Prox forearm         0.13                              +-----------------+-------------+----------+--------------+ Mid forearm                             not visualized +-----------------+-------------+----------+--------------+ Distal forearm                          not visualized +-----------------+-------------+----------+--------------+ *See table(s) above for measurements and observations.  Diagnosing physician: Servando Snare MD Electronically signed by Servando Snare MD on 09/06/2020 at 2:41:12 PM.    Final      Medications:     amLODipine  10 mg Oral Daily   arformoterol  15 mcg Nebulization BID   And   umeclidinium bromide  1 puff Inhalation Daily   aspirin  81 mg Oral Daily   atorvastatin  80 mg Oral Daily   carvedilol  50 mg Oral BID WC   cloNIDine  0.3 mg Transdermal Weekly   doxazosin  16 mg Oral QHS   feeding supplement  237 mL Oral BID BM    heparin  5,000 Units Subcutaneous Q12H   hydrALAZINE  75 mg Oral TID   isosorbide mononitrate  30 mg Oral Daily   lidocaine  1 patch Transdermal Q24H   minoxidil  20 mg Oral Daily   multivitamin  1 tablet Oral QHS   pantoprazole  40 mg Oral Daily   acetaminophen, albuterol, nitroGLYCERIN, oxyCODONE, polyvinyl alcohol, prochlorperazine  Assessment/ Plan:  1.Renal-stage V chronic kidney disease.  It appears that patient is going to need to have vascular access.  Appreciate assistance from vein and vascular surgery.  Placement of catheter and fistula 09/09/2020    Appreciate assistance from social worker Ms. Terri Piedra 2. Hypertension/volume  -appears to be well controlled today on multiple medications. 3.  Anemia iron studies intact will start ESA 4.  Bones PTH 132    LOS: 1 Sherril Croon @TODAY @11 :05 AM

## 2020-09-07 NOTE — Progress Notes (Signed)
PROGRESS NOTE  [name]  OIZ:[id number] DOB: [birth year] DOA: 09/02/2020 PCP: Kerin Perna, NP   Brief Narrative: [name] is a 50 y.o. 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   Patient has had multiple episodes of chest pain this year, all these episodes always considered not ACS etiology. This time, patient started to have left-sided chest pains, dull to pressure-like episodic, each episode last for 5 to 10 minutes, worsening with upper torso movement and left arm movement, relieved with sitting down.  Denied any associated symptoms such as feeling nauseous vomiting, palpitations or sweating.  She has been taking around-the-clock Tylenol to prevent this episode from happening however he still has 5/10 pain this morning, and came to ED. Trop 85>79>76, EKG showed LVH with secondary changes chronic.    Cardiology was consulted and assisted with medication titration. On 6/23 renal function was noted to have worsened, so nephrology consulted and is requesting vascular surgery consultation for [facility] and placement of AVF vs. AVG.   Assessment & Plan: Active Problems:   Chest pain, rule out acute myocardial infarction   Chest pain   Malnutrition of moderate degree   ESRD needing dialysis ([place])  Blurry vision: Predominant right eye (not right visual fields) with relatively acute onset. CT head without new ischemic findings. Seen by ophthalmology, Dr. Stacie Glaze on 09/06/20, appreciate recs: artifical tears OD 2-4x/day PRN, can f/u PRN with regular eye doctor as outpatient and to get updated Mrx/glasses,in addition to BP control.  Chest pain: Not typical of ACS with no ischemic ECG changes, though  he has known CAD. Mild troponin elevation especially in light of severe renal impairment. This may be more flaring of chronic chest pain he associates with open heart surgery for MVR/AAA Tx.  - Responsive to nitro, not narcotics  - Cardiology started Imdur, increasing Coreg  - Continue ASA, statin, beta blocker - Lidocaine patch, tylenol prn.    Chronic combined HFrEF: LVEF 2013 was 35-40% and has been up and down since, most recently normalized to 60-65% with G2DD in March 2022.  - Echo this admission with hyperdynamic LVEF >75%, G1DD, moderate biatrial enlargement, no pericardial effusion.  - Echo with speckled LVH > renal impairment not yet on HD precludes contrast (MRI or CT). Immunofixation negative. No further work up currently planned per cardiology  Episodic Hypotension: 6/25 in setting of up-titrating antihypertensives  Resistant HTN:  - Continue clonidine patch, minoxidil, amlodipine, carvedilol, hydralazine, doxazosin. Coreg and imdur being titrated upward by cardiology.   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  VVS consulted, planning TDC and AVF vs. AVG.   Anemia of CKD: Seems to be progressive with worsening renal function. No evidence of bleeding.  - Continue monitoring.    Chronic systolic CHF -Euvolemic, no indication for diuresis. Echo as below   Biosynthetic AVR and mitral valve - Appears to be stable - Echo 09/03/20: EF >33%, grade 1 diastolic dysfunction, biatrial moderate dilation, moderate AS   HLD - Outpatient [facility]   History of CVA: s/p loop recorder.  - Follow up with cardiology for loop recorder management - Continue statin,  repatha  Chronic thrombocytopenia - Stable, monitoring.  COPD: Quiescent.  - Continue BDs.   GERD:  - PPI  DVT prophylaxis: Heparin q12h Code Status: Full Family Communication: Uncle at  bedside Disposition Plan:  Status is: Inpatient.   Dispo: The patient is from: Home              Anticipated d/c is to: Home          Consultants:  Cardiology Nephrology Ophthalmology  Procedures:  None  Antimicrobials: None   Subjective: Pt says feeling well today.  No chest pain or SOB at rest.  No F/C, N/V or other acute complaints.  Confirms plan for dialysis being started this admission.    Objective: Vitals:   09/07/20 0905 09/07/20 1100 09/07/20 1300 09/07/20 1339  BP:  (!) 98/59 (!) 88/51 (!) 101/52  Pulse:  71  68  Resp:  16    Temp:      TempSrc:      SpO2: 94%   93%  Weight:      Height:        Intake/Output Summary (Last 24 hours) at 09/07/2020 1539 Last data filed at 09/07/2020 0228 Gross per 24 hour  Intake 480 ml  Output 200 ml  Net 280 ml   Filed Weights   09/02/20 0043  Weight: 68.9 kg   Gen: 50 y.o. male in no distress Pulm: CTAB, normal respiratory effort, on room air. CV: RRR, 3/6 systolic murmur, no peripheral edema. GI: soft, NT, ND.  Skin: No new rashes, lesions or ulcers on visualized skin. Neuro: Alert and oriented. Normal speech. No gross focal motor deficits. Psych: Judgement and insight appear fair. Mood euthymic & affect congruent. Behavior is appropriate.    Data Reviewed: I have personally reviewed following labs and imaging studies  CBC: Recent Labs  Lab 09/02/20 0118 09/05/20 1150  WBC 4.6 3.2*  HGB 11.8* 9.0*  HCT 36.2* 27.0*  MCV 94.3 91.8  PLT 78* 67*   Basic Metabolic Panel: Recent Labs  Lab 09/02/20 0118 09/05/20 1150 09/06/20 1009 09/07/20 0206  NA 142 136 136 136  K 4.4 4.3 4.0 4.0  CL 111 106 106 106  CO2 22 23 22 23   GLUCOSE 120* 125* 145* 107*  BUN 51* 59* 65* 63*  CREATININE 6.94* 8.56* 9.10* 9.58*  CALCIUM 8.8* 8.8* 8.6* 8.7*  PHOS  --   --   --  5.3*   GFR: Estimated Creatinine Clearance: 9.2 mL/min (A) (by C-G formula based on SCr of 9.58 mg/dL (H)).   Recent Results (from the past 240  hour(s))  Resp Panel by RT-PCR (Flu A&B, Covid) Nasopharyngeal Swab     Status: None   Collection Time: 09/02/20  7:33 PM   Specimen: Nasopharyngeal Swab; Nasopharyngeal(NP) swabs in vial transport medium  Result Value Ref Range Status   SARS Coronavirus 2 by RT PCR NEGATIVE NEGATIVE Final    Comment: (NOTE) SARS-CoV-2 target nucleic acids are NOT DETECTED.  The SARS-CoV-2 RNA is generally detectable in upper respiratory specimens during the acute phase of infection. The lowest concentration of SARS-CoV-2 viral copies this assay can detect is 138 copies/mL. A negative result does not preclude SARS-Cov-2 infection and should not be used as the sole basis for treatment or other patient management decisions. A negative result may occur with  improper specimen collection/handling, submission of specimen other than nasopharyngeal swab, presence of viral mutation(s) within the areas targeted by this assay, and inadequate number of viral copies(<138 copies/mL).  A negative result must be combined with clinical observations, patient history, and epidemiological information. The expected result is Negative.  Fact Sheet for Patients:  EntrepreneurPulse.com.au  Fact Sheet for Healthcare Providers:  IncredibleEmployment.be  This test is no t yet approved or cleared by the [place] FDA and  has been authorized for detection and/or diagnosis of SARS-CoV-2 by FDA under an Emergency Use Authorization (EUA). This EUA will remain  in effect (meaning this test can be used) for the duration of the COVID-19 declaration under Section 564(b)(1) of the Act, 21 U.S.C.section 360bbb-3(b)(1), unless the authorization is terminated  or revoked sooner.       Influenza A by PCR NEGATIVE NEGATIVE Final   Influenza B by PCR NEGATIVE NEGATIVE Final    Comment: (NOTE) The Xpert Xpress SARS-CoV-2/FLU/RSV plus assay is intended as an aid in the diagnosis of influenza from  Nasopharyngeal swab specimens and should not be used as a sole basis for treatment. Nasal washings and aspirates are unacceptable for Xpert Xpress SARS-CoV-2/FLU/RSV testing.  Fact Sheet for Patients: EntrepreneurPulse.com.au  Fact Sheet for Healthcare Providers: IncredibleEmployment.be  This test is not yet approved or cleared by the [place] FDA and has been authorized for detection and/or diagnosis of SARS-CoV-2 by FDA under an Emergency Use Authorization (EUA). This EUA will remain in effect (meaning this test can be used) for the duration of the COVID-19 declaration under Section 564(b)(1) of the Act, 21 U.S.C. section 360bbb-3(b)(1), unless the authorization is terminated or revoked.  Performed at [facility], [place] [address], [place], [place] [address]       Radiology Studies: CT HEAD WO CONTRAST  Result Date: 09/05/2020 CLINICAL DATA:  Neuro deficit, acute, stroke suspected acute blurry vision in right eye, hx CVA, no other deficits EXAM: CT HEAD WITHOUT CONTRAST TECHNIQUE: Contiguous axial images were obtained from the base of the skull through the vertex without intravenous contrast. COMPARISON:  Head CT 12/02/2017 FINDINGS: Brain: Remote left parietal infarct is unchanged from prior imaging. No evidence of acute ischemia. Normal brain volume with minor periventricular chronic small vessel ischemia. No intracranial hemorrhage, mass effect, or midline shift. No hydrocephalus. The basilar cisterns are patent. No extra-axial or intracranial fluid collection. Vascular: No hyperdense vessel. Skull: No fracture or focal lesion. Sinuses/Orbits: Mucosal thickening throughout the ethmoid air cells and left frontal sinus. Mucous retention cysts in the maxillary sinuses. Remote right orbital fracture. No acute orbital findings Other: None. IMPRESSION: 1. No acute intracranial abnormality. 2. Remote left parietal infarct. 3. Remote right orbital  wall fracture. Electronically Signed   By: Keith Rake M.D.   On: 09/05/2020 18:53   VAS [name] UPPER EXT VEIN MAPPING (PRE-OP AVF)  Result Date: 09/06/2020 UPPER EXTREMITY VEIN MAPPING Patient Name:  ELERY CADENHEAD  Date of Exam:   09/06/2020 Medical Rec #: [id number]      Accession #:    [id number] Date of Birth: Mar 10, 1971       Patient Gender: M Patient Age:   51Y Exam Location:  [facility] Procedure:      VAS [name] UPPER EXT VEIN MAPPING (PRE-OP AVF) Referring Phys: [id number] SAMANTHA J RHYNE --------------------------------------------------------------------------------  Indications: Pre-access. Comparison Study: No prior Performing Technologist: Oda Cogan RDMS, RVT  Examination Guidelines: A complete evaluation includes B-mode imaging, spectral Doppler, color Doppler, and power Doppler as needed of all accessible portions of each vessel. Bilateral testing is considered an integral part of a complete examination. Limited examinations for reoccurring indications may be performed  as noted. +-----------------+-------------+----------+--------------+ Right Cephalic   Diameter (cm)Depth (cm)   Findings    +-----------------+-------------+----------+--------------+ Shoulder                                not visualized +-----------------+-------------+----------+--------------+ Prox upper arm                          not visualized +-----------------+-------------+----------+--------------+ Mid upper arm                           not visualized +-----------------+-------------+----------+--------------+ Dist upper arm                          not visualized +-----------------+-------------+----------+--------------+ Antecubital fossa                       not visualized +-----------------+-------------+----------+--------------+ Prox forearm                            not visualized +-----------------+-------------+----------+--------------+ Mid forearm          0.21                  branching    +-----------------+-------------+----------+--------------+ Dist forearm         0.21                              +-----------------+-------------+----------+--------------+ Wrist                0.20                              +-----------------+-------------+----------+--------------+ +-----------------+-------------+----------+--------+ Right Basilic    Diameter (cm)Depth (cm)Findings +-----------------+-------------+----------+--------+ Shoulder             0.44                        +-----------------+-------------+----------+--------+ Prox upper arm       0.44                        +-----------------+-------------+----------+--------+ Mid upper arm        0.37                        +-----------------+-------------+----------+--------+ Dist upper arm       0.45                        +-----------------+-------------+----------+--------+ Antecubital fossa    0.41                        +-----------------+-------------+----------+--------+ Prox forearm         0.27                        +-----------------+-------------+----------+--------+ Mid forearm          0.18                        +-----------------+-------------+----------+--------+ Distal forearm       0.13                        +-----------------+-------------+----------+--------+  Wrist                0.17                        +-----------------+-------------+----------+--------+ +-----------------+-------------+----------+--------+ Left Cephalic    Diameter (cm)Depth (cm)Findings +-----------------+-------------+----------+--------+ Prox upper arm       0.20                        +-----------------+-------------+----------+--------+ Mid upper arm        0.12                        +-----------------+-------------+----------+--------+ Dist upper arm       0.18                        +-----------------+-------------+----------+--------+  Antecubital fossa    0.19                        +-----------------+-------------+----------+--------+ Prox forearm         0.17                        +-----------------+-------------+----------+--------+ Mid forearm          0.18                        +-----------------+-------------+----------+--------+ Dist forearm         0.15                        +-----------------+-------------+----------+--------+ Wrist                0.17                        +-----------------+-------------+----------+--------+ +-----------------+-------------+----------+--------------+ Left Basilic     Diameter (cm)Depth (cm)   Findings    +-----------------+-------------+----------+--------------+ Prox upper arm       0.49                              +-----------------+-------------+----------+--------------+ Mid upper arm        0.36                              +-----------------+-------------+----------+--------------+ Dist upper arm       0.35                              +-----------------+-------------+----------+--------------+ Antecubital fossa    0.29                 branching    +-----------------+-------------+----------+--------------+ Prox forearm         0.13                              +-----------------+-------------+----------+--------------+ Mid forearm                             not visualized +-----------------+-------------+----------+--------------+ Distal forearm                          not visualized +-----------------+-------------+----------+--------------+ *  See table(s) above for measurements and observations.  Diagnosing physician: Servando Snare MD Electronically signed by Servando Snare MD on 09/06/2020 at 2:41:12 PM.    Final     Scheduled Meds:  amLODipine  10 mg Oral Daily   arformoterol  15 mcg Nebulization BID   And   umeclidinium bromide  1 puff Inhalation Daily   aspirin  81 mg Oral Daily   atorvastatin  80 mg Oral Daily    carvedilol  50 mg Oral BID WC   [START ON 09/09/2020] cloNIDine  0.1 mg Transdermal Weekly   darbepoetin (ARANESP) injection - NON-DIALYSIS  40 mcg Subcutaneous Q Sat-1800   doxazosin  16 mg Oral QHS   feeding supplement  237 mL Oral BID BM   heparin  5,000 Units Subcutaneous Q12H   hydrALAZINE  75 mg Oral TID   isosorbide mononitrate  30 mg Oral Daily   lidocaine  1 patch Transdermal Q24H   minoxidil  20 mg Oral Daily   multivitamin  1 tablet Oral QHS   pantoprazole  40 mg Oral Daily   Continuous Infusions:   LOS: 1 day   Time spent: 25 minutes.  Ezekiel Slocumb, DO [facility] [email or web address] 09/07/2020, 3:39 PM

## 2020-09-07 NOTE — Progress Notes (Signed)
   Called by RN. BP running low (94-80 systolic) He is feeling sleepy.  I asked RN to check with Nephrology to make sure ok to give 1/2 NS 250 cc bolus now. Hold next dose of Hydralazine and Carvedilol. Change Clonidine patch to 0.1 mg.  Richardson Dopp, PA-C    09/07/2020 1:29 PM

## 2020-09-07 NOTE — Progress Notes (Signed)
Progress Note  Patient Name: [name] Date of Encounter: 09/07/2020  [facility] Cardiologist: [name], MD   Subjective   Reports intermittent chest pain, no dyspnea  Inpatient Medications    Scheduled Meds:  amLODipine  10 mg Oral Daily   arformoterol  15 mcg Nebulization BID   And   umeclidinium bromide  1 puff Inhalation Daily   aspirin  81 mg Oral Daily   atorvastatin  80 mg Oral Daily   carvedilol  50 mg Oral BID WC   cloNIDine  0.3 mg Transdermal Weekly   doxazosin  16 mg Oral QHS   feeding supplement  237 mL Oral BID BM   heparin  5,000 Units Subcutaneous Q12H   hydrALAZINE  75 mg Oral TID   isosorbide mononitrate  30 mg Oral Daily   lidocaine  1 patch Transdermal Q24H   minoxidil  20 mg Oral Daily   multivitamin  1 tablet Oral QHS   pantoprazole  40 mg Oral Daily   Continuous Infusions:  PRN Meds: acetaminophen, albuterol, nitroGLYCERIN, oxyCODONE, polyvinyl alcohol, prochlorperazine   Vital Signs    Vitals:   09/06/20 1948 09/07/20 0500 09/07/20 0824 09/07/20 0905  BP: 106/72 109/64 112/70   Pulse: 76 84    Resp: 16 18    Temp: 97.9 F (36.6 C) 98.1 F (36.7 C)    TempSrc: Oral Oral    SpO2: 96% 100%  94%  Weight:      Height:        Intake/Output Summary (Last 24 hours) at 09/07/2020 1037 Last data filed at 09/07/2020 0228 Gross per 24 hour  Intake 720 ml  Output 200 ml  Net 520 ml    Last 3 Weights 09/02/2020 08/30/2020 07/23/2020  Weight (lbs) 152 lb 152 lb 12.8 oz 158 lb 12.8 oz  Weight (kg) 68.947 kg 69.31 kg 72.031 kg      Telemetry    Sinus rhythm HR 70-80s - Personally Reviewed  ECG    No new tracings - Personally Reviewed  Physical Exam   GEN: No acute distress.   Neck: No JVD Cardiac: RRR, 3/6 systolic murmur  Respiratory: Clear to auscultation bilaterally. GI: Soft, nontender, non-distended  MS: No edema; No deformity. Neuro:  Nonfocal  Psych: Normal affect   Labs    High Sensitivity Troponin:    Recent Labs  Lab 09/02/20 0118 09/02/20 0308 09/02/20 0840  TROPONINIHS 85* 79* 76*       Chemistry Recent Labs  Lab 09/05/20 1150 09/06/20 1009 09/07/20 0206  NA 136 136 136  K 4.3 4.0 4.0  CL 106 106 106  CO2 23 22 23   GLUCOSE 125* 145* 107*  BUN 59* 65* 63*  CREATININE 8.56* 9.10* 9.58*  CALCIUM 8.8* 8.6* 8.7*  PROT 5.5*  --   --   ALBUMIN 3.0*  --  2.9*  AST 19  --   --   ALT 14  --   --   ALKPHOS 59  --   --   BILITOT 0.7  --   --   GFRNONAA 7* 7* 6*  ANIONGAP 7 8 7       Hematology Recent Labs  Lab 09/02/20 0118 09/05/20 1150  WBC 4.6 3.2*  RBC 3.84* 2.94*  HGB 11.8* 9.0*  HCT 36.2* 27.0*  MCV 94.3 91.8  MCH 30.7 30.6  MCHC 32.6 33.3  RDW 13.7 12.7  PLT 78* 67*     BNPNo results for input(s): BNP, PROBNP in  the last 168 hours.   DDimer No results for input(s): DDIMER in the last 168 hours.   Radiology    CT HEAD WO CONTRAST  Result Date: 09/05/2020 CLINICAL DATA:  Neuro deficit, acute, stroke suspected acute blurry vision in right eye, hx CVA, no other deficits EXAM: CT HEAD WITHOUT CONTRAST TECHNIQUE: Contiguous axial images were obtained from the base of the skull through the vertex without intravenous contrast. COMPARISON:  Head CT 12/02/2017 FINDINGS: Brain: Remote left parietal infarct is unchanged from prior imaging. No evidence of acute ischemia. Normal brain volume with minor periventricular chronic small vessel ischemia. No intracranial hemorrhage, mass effect, or midline shift. No hydrocephalus. The basilar cisterns are patent. No extra-axial or intracranial fluid collection. Vascular: No hyperdense vessel. Skull: No fracture or focal lesion. Sinuses/Orbits: Mucosal thickening throughout the ethmoid air cells and left frontal sinus. Mucous retention cysts in the maxillary sinuses. Remote right orbital fracture. No acute orbital findings Other: None. IMPRESSION: 1. No acute intracranial abnormality. 2. Remote left parietal infarct. 3. Remote  right orbital wall fracture. Electronically Signed   By: Keith Rake M.D.   On: 09/05/2020 18:53   VAS [name] UPPER EXT VEIN MAPPING (PRE-OP AVF)  Result Date: 09/06/2020 UPPER EXTREMITY VEIN MAPPING Patient Name:  RADWAN COWLEY  Date of Exam:   09/06/2020 Medical Rec #: [id number]      Accession #:    [id number] Date of Birth: Dec 08, 1970       Patient Gender: M Patient Age:   82Y Exam Location:  [facility] Procedure:      VAS [name] UPPER EXT VEIN MAPPING (PRE-OP AVF) Referring Phys: [id number] SAMANTHA J RHYNE --------------------------------------------------------------------------------  Indications: Pre-access. Comparison Study: No prior Performing Technologist: Oda Cogan RDMS, RVT  Examination Guidelines: A complete evaluation includes B-mode imaging, spectral Doppler, color Doppler, and power Doppler as needed of all accessible portions of each vessel. Bilateral testing is considered an integral part of a complete examination. Limited examinations for reoccurring indications may be performed as noted. +-----------------+-------------+----------+--------------+ Right Cephalic   Diameter (cm)Depth (cm)   Findings    +-----------------+-------------+----------+--------------+ Shoulder                                not visualized +-----------------+-------------+----------+--------------+ Prox upper arm                          not visualized +-----------------+-------------+----------+--------------+ Mid upper arm                           not visualized +-----------------+-------------+----------+--------------+ Dist upper arm                          not visualized +-----------------+-------------+----------+--------------+ Antecubital fossa                       not visualized +-----------------+-------------+----------+--------------+ Prox forearm                            not visualized +-----------------+-------------+----------+--------------+ Mid forearm           0.21                 branching    +-----------------+-------------+----------+--------------+ Dist forearm         0.21                              +-----------------+-------------+----------+--------------+  Wrist                0.20                              +-----------------+-------------+----------+--------------+ +-----------------+-------------+----------+--------+ Right Basilic    Diameter (cm)Depth (cm)Findings +-----------------+-------------+----------+--------+ Shoulder             0.44                        +-----------------+-------------+----------+--------+ Prox upper arm       0.44                        +-----------------+-------------+----------+--------+ Mid upper arm        0.37                        +-----------------+-------------+----------+--------+ Dist upper arm       0.45                        +-----------------+-------------+----------+--------+ Antecubital fossa    0.41                        +-----------------+-------------+----------+--------+ Prox forearm         0.27                        +-----------------+-------------+----------+--------+ Mid forearm          0.18                        +-----------------+-------------+----------+--------+ Distal forearm       0.13                        +-----------------+-------------+----------+--------+ Wrist                0.17                        +-----------------+-------------+----------+--------+ +-----------------+-------------+----------+--------+ Left Cephalic    Diameter (cm)Depth (cm)Findings +-----------------+-------------+----------+--------+ Prox upper arm       0.20                        +-----------------+-------------+----------+--------+ Mid upper arm        0.12                        +-----------------+-------------+----------+--------+ Dist upper arm       0.18                         +-----------------+-------------+----------+--------+ Antecubital fossa    0.19                        +-----------------+-------------+----------+--------+ Prox forearm         0.17                        +-----------------+-------------+----------+--------+ Mid forearm          0.18                        +-----------------+-------------+----------+--------+ Dist forearm         0.15                        +-----------------+-------------+----------+--------+  Wrist                0.17                        +-----------------+-------------+----------+--------+ +-----------------+-------------+----------+--------------+ Left Basilic     Diameter (cm)Depth (cm)   Findings    +-----------------+-------------+----------+--------------+ Prox upper arm       0.49                              +-----------------+-------------+----------+--------------+ Mid upper arm        0.36                              +-----------------+-------------+----------+--------------+ Dist upper arm       0.35                              +-----------------+-------------+----------+--------------+ Antecubital fossa    0.29                 branching    +-----------------+-------------+----------+--------------+ Prox forearm         0.13                              +-----------------+-------------+----------+--------------+ Mid forearm                             not visualized +-----------------+-------------+----------+--------------+ Distal forearm                          not visualized +-----------------+-------------+----------+--------------+ *See table(s) above for measurements and observations.  Diagnosing physician: Servando Snare MD Electronically signed by Servando Snare MD on 09/06/2020 at 2:41:12 PM.    Final     Cardiac Studies   Echo 09/03/20:  1. 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 S/P AVR with  elevated mean gradient of 33.5 mmHg (similar to  gradient of 31 mmHg on  05/18/20 study).   2. Left ventricular ejection fraction, by estimation, is >75%. The left  ventricle has hyperdynamic function. The left ventricle has no regional  wall motion abnormalities. Left ventricular diastolic parameters are  consistent with Grade I diastolic  dysfunction (impaired relaxation). Elevated left atrial pressure.   3. Right ventricular systolic function is normal. The right ventricular  size is normal.   4. Left atrial size was moderately dilated.   5. Right atrial size was moderately dilated.   6. The mitral valve is normal in structure. Trivial mitral valve  regurgitation. No evidence of mitral stenosis.   7. The aortic valve has been repaired/replaced. Aortic valve  regurgitation is not visualized. Moderate aortic valve stenosis. There is  a bioprosthetic valve present in the aortic position.   Patient Profile     50 y.o. male with a hx of CAD with NSTEMI 11/2017 s/p PCI to ramus, tobacco use, CKD Stage III, HTN, AAA repair 2016 at 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   Assessment & Plan    Persistent chest pain Known CAD - hs troponin low and flat - CKD stage V - inflammatory markers  are WNL makign pericarditis/myocarditis less likely - echo with grade 1DD, hyperdynamic LVEF, moderate biatrial enlargement - has been intolerant to imdur in the past - tender to palpation in left upper chest - No further cardiac work-up recommended   Cardiomyopathy  Chronic combined systolic and diastolic heart failure - EF hyperdynamic with grade 1 DD - previously EF was 35-40% in 2013, then normalized in 2016, reduced to 35-40% in 2019, normalized again in 05/2020 - GDMT limited by renal disease - does not appear volume up - echo with concern for speckled appearance, PYP scan in 2020  negative for amyloid.  Would plan repeat evaluation, will check light chains/SPEP/UPEP and can plan repeat PYP as outpatient.  - suspect role for prior ETOH use in CM   Hypertension - continue amlodipine, hydralazine, clonidine, doxazosin, coreg, imdur.  Appears controlled  ESRD - sCr has risen to 9.58, planning to start HD  AAA s/p [place] - 11/2014 at [facility] bioprosthetic aortic valve.  Now with moderate AS - last evaluation with CTA 11/2019  Hx of CVA - s/p ILR in place - no transmission since 01/2020    For questions or updates, please contact [place] Please consult [email or web address] for contact info under        Signed, Donato Heinz, MD  09/07/2020, 10:37 AM

## 2020-09-08 DIAGNOSIS — N185 Chronic kidney disease, stage 5: Secondary | ICD-10-CM

## 2020-09-08 DIAGNOSIS — I5042 Chronic combined systolic (congestive) and diastolic (congestive) heart failure: Secondary | ICD-10-CM

## 2020-09-08 LAB — CBC
HCT: 25.3 % — ABNORMAL LOW (ref 39.0–52.0)
Hemoglobin: 8.7 g/dL — ABNORMAL LOW (ref 13.0–17.0)
MCH: 31.2 pg (ref 26.0–34.0)
MCHC: 34.4 g/dL (ref 30.0–36.0)
MCV: 90.7 fL (ref 80.0–100.0)
Platelets: 68 10*3/uL — ABNORMAL LOW (ref 150–400)
RBC: 2.79 MIL/uL — ABNORMAL LOW (ref 4.22–5.81)
RDW: 12.8 % (ref 11.5–15.5)
WBC: 3.9 10*3/uL — ABNORMAL LOW (ref 4.0–10.5)
nRBC: 0 % (ref 0.0–0.2)

## 2020-09-08 LAB — RENAL FUNCTION PANEL
Albumin: 3.1 g/dL — ABNORMAL LOW (ref 3.5–5.0)
Anion gap: 8 (ref 5–15)
BUN: 64 mg/dL — ABNORMAL HIGH (ref 6–20)
CO2: 22 mmol/L (ref 22–32)
Calcium: 8.9 mg/dL (ref 8.9–10.3)
Chloride: 106 mmol/L (ref 98–111)
Creatinine, Ser: 9.85 mg/dL — ABNORMAL HIGH (ref 0.61–1.24)
GFR, Estimated: 6 mL/min — ABNORMAL LOW (ref >60)
Glucose, Bld: 98 mg/dL (ref 70–99)
Phosphorus: 5.5 mg/dL — ABNORMAL HIGH (ref 2.5–4.6)
Potassium: 4.3 mmol/L (ref 3.5–5.1)
Sodium: 136 mmol/L (ref 135–145)

## 2020-09-08 MED ORDER — HYDRALAZINE HCL 50 MG PO TABS
50.0000 mg | ORAL_TABLET | Freq: Three times a day (TID) | ORAL | Status: DC
  Administered 2020-09-08 – 2020-09-11 (×9): 50 mg via ORAL
  Filled 2020-09-08 (×9): qty 1

## 2020-09-08 MED ORDER — CLONIDINE HCL 0.2 MG/24HR TD PTWK
0.2000 mg | MEDICATED_PATCH | TRANSDERMAL | Status: DC
  Administered 2020-09-08: 0.2 mg via TRANSDERMAL
  Filled 2020-09-08: qty 1

## 2020-09-08 MED ORDER — DICLOFENAC SODIUM 1 % EX GEL
4.0000 g | Freq: Four times a day (QID) | CUTANEOUS | Status: DC
  Administered 2020-09-08 – 2020-09-11 (×7): 4 g via TOPICAL
  Filled 2020-09-08: qty 100

## 2020-09-08 MED ORDER — SODIUM CHLORIDE 0.9 % IV SOLN
1.5000 g | INTRAVENOUS | Status: AC
  Administered 2020-09-09: 1.5 g via INTRAVENOUS
  Filled 2020-09-08: qty 1.5

## 2020-09-08 MED ORDER — CARVEDILOL 25 MG PO TABS
25.0000 mg | ORAL_TABLET | Freq: Two times a day (BID) | ORAL | Status: DC
  Administered 2020-09-08 – 2020-09-11 (×6): 25 mg via ORAL
  Filled 2020-09-08 (×6): qty 1

## 2020-09-08 NOTE — Plan of Care (Signed)

## 2020-09-08 NOTE — Progress Notes (Addendum)
  Progress Note    09/08/2020 9:11 AM * No surgery date entered *  Subjective: No complaints this morning  Vitals:   09/08/20 0735 09/08/20 0800  BP:  (!) 144/78  Pulse:  89  Resp:  15  Temp:    SpO2: 93% 95%    Physical Exam: Awake alert oriented Nonlabored respirations Left radial artery is palpable for  CBC    Component Value Date/Time   WBC 3.9 (L) 09/08/2020 0257   RBC 2.79 (L) 09/08/2020 0257   HGB 8.7 (L) 09/08/2020 0257   HGB 12.2 (L) 06/22/2017 1220   HCT 25.3 (L) 09/08/2020 0257   HCT 38.1 06/22/2017 1220   PLT 68 (L) 09/08/2020 0257   PLT 156 06/22/2017 1220   MCV 90.7 09/08/2020 0257   MCV 93 06/22/2017 1220   MCH 31.2 09/08/2020 0257   MCHC 34.4 09/08/2020 0257   RDW 12.8 09/08/2020 0257   RDW 14.9 06/22/2017 1220   LYMPHSABS 1.3 12/02/2017 1457   LYMPHSABS 1.4 06/22/2017 1220   MONOABS 0.5 12/02/2017 1457   EOSABS 0.3 12/02/2017 1457   EOSABS 0.1 06/22/2017 1220   BASOSABS 0.0 12/02/2017 1457   BASOSABS 0.0 06/22/2017 1220    BMET    Component Value Date/Time   NA 136 09/08/2020 0257   NA 140 11/02/2019 0927   K 4.3 09/08/2020 0257   CL 106 09/08/2020 0257   CO2 22 09/08/2020 0257   GLUCOSE 98 09/08/2020 0257   BUN 64 (H) 09/08/2020 0257   BUN 27 (H) 11/02/2019 0927   CREATININE 9.85 (H) 09/08/2020 0257   CREATININE 1.52 (H) 06/08/2014 1132   CALCIUM 8.9 09/08/2020 0257   GFRNONAA 6 (L) 09/08/2020 0257   GFRAA 24 (L) 11/26/2019 0532    INR    Component Value Date/Time   INR 1.1 05/17/2020 1849     Intake/Output Summary (Last 24 hours) at 09/08/2020 0911 Last data filed at 09/07/2020 2234 Gross per 24 hour  Intake --  Output 300 ml  Net -300 ml     Assessment/plan:  50 y.o. male is ambidextrous though uses his right arm more.  Patient now in need of permanent and temporary dialysis access.  We will plan for tunneled dialysis catheter and left arm fistula versus possible graft tomorrow.  I discussed the risk benefits and the  alternatives and he demonstrates good understanding.    Amalie Koran C. Donzetta Matters, MD [facility] Office: [phone] Pager: [phone]  09/08/2020 9:11 AM

## 2020-09-08 NOTE — Progress Notes (Signed)
PROGRESS NOTE  [name]  UXN:[id number] DOB: 01/24/1971 DOA: 09/02/2020 PCP: Kerin Perna, NP   Brief Narrative: [name] is a 50 y.o. 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   Patient has had multiple episodes of chest pain this year, all these episodes always considered not ACS etiology. This time, patient started to have left-sided chest pains, dull to pressure-like episodic, each episode last for 5 to 10 minutes, worsening with upper torso movement and left arm movement, relieved with sitting down.  Denied any associated symptoms such as feeling nauseous vomiting, palpitations or sweating.  She has been taking around-the-clock Tylenol to prevent this episode from happening however he still has 5/10 pain this morning, and came to ED. Trop 85>79>76, EKG showed LVH with secondary changes chronic.    Cardiology was consulted and assisted with medication titration. On 6/23 renal function was noted to have worsened, so nephrology consulted and is requesting vascular surgery consultation for [facility] and placement of AVF vs. AVG.   Assessment & Plan: Active Problems:   Chest pain, rule out acute myocardial infarction   Chest pain   Malnutrition of moderate degree   ESRD needing dialysis ([place])  Blurry vision: Predominant right eye (not right visual fields) with relatively acute onset. CT head without new ischemic findings. Seen by ophthalmology, Dr. Stacie Glaze on 09/06/20, appreciate recs: artifical tears OD 2-4x/day PRN, can f/u PRN with regular eye doctor as outpatient and to get updated Mrx/glasses,in addition to BP control.  Atypical Chest pain, pleuritic: Not typical of ACS with no ischemic  ECG changes, though he has known CAD. Mild troponin elevation especially in light of severe renal impairment. This may be more flaring of chronic chest pain he associates with open heart surgery for MVR/AAA Tx.  - Responsive to nitro, not narcotics  - Cardiology started Imdur, increasing Coreg  - Continue ASA, statin, beta blocker - Lidocaine patch, tylenol prn.    Chronic combined HFrEF: LVEF 2013 was 35-40% and has been up and down since, most recently normalized to 60-65% with G2DD in March 2022.  - Echo this admission with hyperdynamic LVEF >75%, G1DD, moderate biatrial enlargement, no pericardial effusion.  - Echo with speckled LVH > renal impairment not yet on HD precludes contrast (MRI or CT). Immunofixation negative. No further work up currently planned per cardiology  Episodic Hypotension: 6/25 in setting of up-titrating antihypertensives  Resistant HTN:  - Continue clonidine patch, minoxidil, amlodipine, carvedilol, hydralazine, doxazosin. Coreg and imdur being titrated upward by cardiology.   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  VVS consulted and plan for AV fistula and temporary dialysis catheter 09/09/2020   Anemia of CKD: Seems to be progressive with worsening renal function. No evidence of bleeding.  - Continue monitoring.    Chronic systolic CHF -Euvolemic, no indication for diuresis. Echo as below   Biosynthetic AVR and mitral valve - Appears to be stable - Echo 09/03/20: EF >25%, grade 1 diastolic dysfunction, biatrial moderate dilation, moderate AS   HLD - Outpatient Repatha   History of CVA: s/p loop recorder.  - Follow up with cardiology for  loop recorder management - Continue statin, repatha  Chronic thrombocytopenia - Stable, monitoring.  COPD: Quiescent.  - Continue BDs.   GERD:  - PPI  DVT prophylaxis: Heparin  q12h Code Status: Full Family Communication: Uncle at bedside Disposition Plan:  Status is: Inpatient.   Dispo: The patient is from: Home              Anticipated d/c is to: Home          Consultants:  Cardiology Nephrology Ophthalmology  Procedures:  None  Antimicrobials: None   Subjective: Pt having left-sided pleuritic CP again this AM.  Denies other acute complaints.  Says sleeping well, appetite fair.  No F/C, N/V or SOB.  Objective: Vitals:   09/08/20 0735 09/08/20 0800 09/08/20 1040 09/08/20 1139  BP:  (!) 144/78 112/61 110/61  Pulse:  89 81   Resp:  15 12   Temp:      TempSrc:      SpO2: 93% 95% 95%   Weight:      Height:        Intake/Output Summary (Last 24 hours) at 09/08/2020 1635 Last data filed at 09/08/2020 1100 Gross per 24 hour  Intake 240 ml  Output 700 ml  Net -460 ml   Filed Weights   09/02/20 0043  Weight: 68.9 kg   Gen: awake, drowsy, no distress but appears uncomfortable Pulm: clear b/l, normal respiratory effort, on room air. CV: RRR, 3/6 systolic murmur, no peripheral edema. GI: soft, NT, ND.  Neuro: Alert and oriented. Normal speech. No gross focal motor deficits. Psych: Judgement and insight appear fair. Mood euthymic & affect congruent. Behavior is appropriate.    Data Reviewed: I have personally reviewed following labs and imaging studies  CBC: Recent Labs  Lab 09/02/20 0118 09/05/20 1150 09/08/20 0257  WBC 4.6 3.2* 3.9*  HGB 11.8* 9.0* 8.7*  HCT 36.2* 27.0* 25.3*  MCV 94.3 91.8 90.7  PLT 78* 67* 68*   Basic Metabolic Panel: Recent Labs  Lab 09/02/20 0118 09/05/20 1150 09/06/20 1009 09/07/20 0206 09/08/20 0257  NA 142 136 136 136 136  K 4.4 4.3 4.0 4.0 4.3  CL 111 106 106 106 106  CO2 22 23 22 23 22   GLUCOSE 120* 125* 145* 107* 98  BUN 51* 59* 65* 63* 64*  CREATININE 6.94* 8.56* 9.10* 9.58* 9.85*  CALCIUM 8.8* 8.8* 8.6* 8.7* 8.9  PHOS  --   --   --  5.3* 5.5*   GFR: Estimated Creatinine Clearance: 8.9  mL/min (A) (by C-G formula based on SCr of 9.85 mg/dL (H)).   Recent Results (from the past 240 hour(s))  Resp Panel by RT-PCR (Flu A&B, Covid) Nasopharyngeal Swab     Status: None   Collection Time: 09/02/20  7:33 PM   Specimen: Nasopharyngeal Swab; Nasopharyngeal(NP) swabs in vial transport medium  Result Value Ref Range Status   SARS Coronavirus 2 by RT PCR NEGATIVE NEGATIVE Final    Comment: (NOTE) SARS-CoV-2 target nucleic acids are NOT DETECTED.  The SARS-CoV-2 RNA is generally detectable in upper respiratory specimens during the acute phase of infection. The lowest concentration of SARS-CoV-2 viral copies this assay can detect is 138 copies/mL. A negative result does not preclude SARS-Cov-2 infection and should not be used as the sole basis for treatment or other patient management decisions. A negative result may occur with  improper specimen collection/handling, submission of specimen other than nasopharyngeal swab, presence of viral mutation(s) within the areas targeted by this assay,  and inadequate number of viral copies(<138 copies/mL). A negative result must be combined with clinical observations, patient history, and epidemiological information. The expected result is Negative.  Fact Sheet for Patients:  EntrepreneurPulse.com.au  Fact Sheet for Healthcare Providers:  IncredibleEmployment.be  This test is no t yet approved or cleared by the [place] FDA and  has been authorized for detection and/or diagnosis of SARS-CoV-2 by FDA under an Emergency Use Authorization (EUA). This EUA will remain  in effect (meaning this test can be used) for the duration of the COVID-19 declaration under Section 564(b)(1) of the Act, 21 U.S.C.section 360bbb-3(b)(1), unless the authorization is terminated  or revoked sooner.       Influenza A by PCR NEGATIVE NEGATIVE Final   Influenza B by PCR NEGATIVE NEGATIVE Final    Comment: (NOTE) The  Xpert Xpress SARS-CoV-2/FLU/RSV plus assay is intended as an aid in the diagnosis of influenza from Nasopharyngeal swab specimens and should not be used as a sole basis for treatment. Nasal washings and aspirates are unacceptable for Xpert Xpress SARS-CoV-2/FLU/RSV testing.  Fact Sheet for Patients: EntrepreneurPulse.com.au  Fact Sheet for Healthcare Providers: IncredibleEmployment.be  This test is not yet approved or cleared by the [place] FDA and has been authorized for detection and/or diagnosis of SARS-CoV-2 by FDA under an Emergency Use Authorization (EUA). This EUA will remain in effect (meaning this test can be used) for the duration of the COVID-19 declaration under Section 564(b)(1) of the Act, 21 U.S.C. section 360bbb-3(b)(1), unless the authorization is terminated or revoked.  Performed at [facility], [place] [address], [place], [place] [address]       Radiology Studies: No results found.  Scheduled Meds:  amLODipine  10 mg Oral Daily   arformoterol  15 mcg Nebulization BID   And   umeclidinium bromide  1 puff Inhalation Daily   aspirin  81 mg Oral Daily   atorvastatin  80 mg Oral Daily   carvedilol  25 mg Oral BID WC   cloNIDine  0.2 mg Transdermal Weekly   darbepoetin (ARANESP) injection - NON-DIALYSIS  40 mcg Subcutaneous Q Sat-1800   doxazosin  16 mg Oral QHS   feeding supplement  237 mL Oral BID BM   heparin  5,000 Units Subcutaneous Q12H   hydrALAZINE  50 mg Oral TID   isosorbide mononitrate  30 mg Oral Daily   lidocaine  1 patch Transdermal Q24H   minoxidil  20 mg Oral Daily   multivitamin  1 tablet Oral QHS   pantoprazole  40 mg Oral Daily   Continuous Infusions:  [START ON 09/09/2020] cefUROXime (ZINACEF)  IV       LOS: 2 days   Time spent: 25 minutes >50% spent in coordination of care and at bedside.   Ezekiel Slocumb, DO [facility] [email or web address] 09/08/2020, 4:35 PM

## 2020-09-08 NOTE — Progress Notes (Signed)
[facility] ROUNDING NOTE   Subjective:   Interval History: [name] is a 50 y.o. male.  Admitted 09/02/2020.  This is a gentleman with chronic kidney disease followed by Dr. Joelyn Oms at [facility].  He has been diagnosed with stage V chronic kidney disease and was last seen several weeks ago by the physician assistant.  This was after a significant time of being lost to follow-up.  It was found that his creatinine is about 6 to 8 mg/dL.  Remarkably he is displaying no uremic signs or symptoms.  He presented with chest pain.  He has been evaluated by cardiology.  It is felt the chest pain is noncardiac in origin.  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  His refractory hypertension.  He is to undergo placement of AV fistula and catheter 09/09/2020 appreciate assistance of Dr. Siri Cole.  Blood pressure 144/78 pulse 87 temperature 98.6 O2 sats 99% room air  Sodium 136 potassium 4.3 chloride 106 CO2 22 BUN 64 creatinine 9.85 glucose 98 calcium 8.9 phosphorus 5.5 albumin 3.1 hemoglobin 8.7   Objective:  Vital signs in last 24 hours:  Temp:  [97.8 F (36.6 C)-98.6 F (37 C)] 98.6 F (37 C) (06/26 0337) Pulse Rate:  [68-89] 89 (06/26 0800) Resp:  [14-16] 15 (06/26 0800) BP: (88-144)/(51-78) 144/78 (06/26 0800) SpO2:  [93 %-100 %] 95 % (06/26 0800) FiO2 (%):  [21 %] 21 % (06/26 0735)  Weight change:  Filed Weights   09/02/20 0043  Weight: 68.9 kg    Intake/Output: I/O last 3 completed shifts: In: 240 [P.O.:240] Out: 300 [Urine:300]   Intake/Output this shift:  No intake/output data recorded.  General: Pleasant alert well-developed well-nourished gentleman nondistressed HEENT: Normocephalic atraumatic oropharynx was clear with moist mucous membranes Eyes: Pupils round equal reactive extraocular movements are  intact Neck: JVP not elevated no thyromegaly appreciated Heart: Regular rate and rhythm faint systolic murmur 2/6 left sternal edge Lungs: Clear to auscultation no wheeze rales Abdomen: Soft nontender bowel sounds present Extremities: No cyanosis clubbing or edema Skin: Tattooed but no evidence of any bruises Neuro: Grossly intact.   Basic Metabolic Panel: Recent Labs  Lab 09/02/20 0118 09/05/20 1150 09/06/20 1009 09/07/20 0206 09/08/20 0257  NA 142 136 136 136 136  K 4.4 4.3 4.0 4.0 4.3  CL 111 106 106 106 106  CO2 22 23 22 23 22   GLUCOSE 120* 125* 145* 107* 98  BUN 51* 59* 65* 63* 64*  CREATININE 6.94* 8.56* 9.10* 9.58* 9.85*  CALCIUM 8.8* 8.8* 8.6* 8.7* 8.9  PHOS  --   --   --  5.3* 5.5*     Liver Function Tests: Recent Labs  Lab 09/05/20 1150 09/07/20 0206 09/08/20 0257  AST 19  --   --   ALT 14  --   --   ALKPHOS 59  --   --   BILITOT 0.7  --   --   PROT 5.5*  --   --   ALBUMIN 3.0* 2.9* 3.1*    No results for input(s): LIPASE, AMYLASE in the last 168 hours. No results for input(s): AMMONIA in the last 168 hours.  CBC: Recent Labs  Lab 09/02/20 0118 09/05/20 1150 09/08/20 0257  WBC 4.6 3.2* 3.9*  HGB 11.8* 9.0* 8.7*  HCT 36.2* 27.0* 25.3*  MCV 94.3 91.8 90.7  PLT 78*  67* 68*     Cardiac Enzymes: No results for input(s): CKTOTAL, CKMB, CKMBINDEX, TROPONINI in the last 168 hours.  BNP: Invalid input(s): POCBNP  CBG: Recent Labs  Lab 09/05/20 1244  GLUCAP 124*     Microbiology: Results for orders placed or performed during the hospital encounter of 09/02/20  Resp Panel by RT-PCR (Flu A&B, Covid) Nasopharyngeal Swab     Status: None   Collection Time: 09/02/20  7:33 PM   Specimen: Nasopharyngeal Swab; Nasopharyngeal(NP) swabs in vial transport medium  Result Value Ref Range Status   SARS Coronavirus 2 by RT PCR NEGATIVE NEGATIVE Final    Comment: (NOTE) SARS-CoV-2 target nucleic acids are NOT DETECTED.  The SARS-CoV-2 RNA is  generally detectable in upper respiratory specimens during the acute phase of infection. The lowest concentration of SARS-CoV-2 viral copies this assay can detect is 138 copies/mL. A negative result does not preclude SARS-Cov-2 infection and should not be used as the sole basis for treatment or other patient management decisions. A negative result may occur with  improper specimen collection/handling, submission of specimen other than nasopharyngeal swab, presence of viral mutation(s) within the areas targeted by this assay, and inadequate number of viral copies(<138 copies/mL). A negative result must be combined with clinical observations, patient history, and epidemiological information. The expected result is Negative.  Fact Sheet for Patients:  EntrepreneurPulse.com.au  Fact Sheet for Healthcare Providers:  IncredibleEmployment.be  This test is no t yet approved or cleared by the [place] FDA and  has been authorized for detection and/or diagnosis of SARS-CoV-2 by FDA under an Emergency Use Authorization (EUA). This EUA will remain  in effect (meaning this test can be used) for the duration of the COVID-19 declaration under Section 564(b)(1) of the Act, 21 U.S.C.section 360bbb-3(b)(1), unless the authorization is terminated  or revoked sooner.       Influenza A by PCR NEGATIVE NEGATIVE Final   Influenza B by PCR NEGATIVE NEGATIVE Final    Comment: (NOTE) The Xpert Xpress SARS-CoV-2/FLU/RSV plus assay is intended as an aid in the diagnosis of influenza from Nasopharyngeal swab specimens and should not be used as a sole basis for treatment. Nasal washings and aspirates are unacceptable for Xpert Xpress SARS-CoV-2/FLU/RSV testing.  Fact Sheet for Patients: EntrepreneurPulse.com.au  Fact Sheet for Healthcare Providers: IncredibleEmployment.be  This test is not yet approved or cleared by the [place] FDA and has been authorized for detection and/or diagnosis of SARS-CoV-2 by FDA under an Emergency Use Authorization (EUA). This EUA will remain in effect (meaning this test can be used) for the duration of the COVID-19 declaration under Section 564(b)(1) of the Act, 21 U.S.C. section 360bbb-3(b)(1), unless the authorization is terminated or revoked.  Performed at [facility], [place] [address], [place], [place] [address]     Coagulation Studies: No results for input(s): LABPROT, INR in the last 72 hours.  Urinalysis: No results for input(s): COLORURINE, LABSPEC, PHURINE, GLUCOSEU, HGBUR, BILIRUBINUR, KETONESUR, PROTEINUR, UROBILINOGEN, NITRITE, LEUKOCYTESUR in the last 72 hours.  Invalid input(s): APPERANCEUR    Imaging: VAS [name] UPPER EXT VEIN MAPPING (PRE-OP AVF)  Result Date: 09/06/2020 UPPER EXTREMITY VEIN MAPPING Patient Name:  [name]  Date of Exam:   09/06/2020 Medical Rec #: [id number]      Accession #:    [id number] Date of Birth: [birth year]       Patient Gender: M Patient Age:   45Y Exam Location:  [facility] Procedure:  VAS [name] UPPER EXT VEIN MAPPING (PRE-OP AVF) Referring Phys: [place] --------------------------------------------------------------------------------  Indications: Pre-access. Comparison Study: No prior Performing Technologist: Oda Cogan RDMS, RVT  Examination Guidelines: A complete evaluation includes B-mode imaging, spectral Doppler, color Doppler, and power Doppler as needed of all accessible portions of each vessel. Bilateral testing is considered an integral part of a complete examination. Limited examinations for reoccurring indications may be performed as noted. +-----------------+-------------+----------+--------------+ Right Cephalic   Diameter (cm)Depth (cm)   Findings    +-----------------+-------------+----------+--------------+ Shoulder                                not visualized  +-----------------+-------------+----------+--------------+ Prox upper arm                          not visualized +-----------------+-------------+----------+--------------+ Mid upper arm                           not visualized +-----------------+-------------+----------+--------------+ Dist upper arm                          not visualized +-----------------+-------------+----------+--------------+ Antecubital fossa                       not visualized +-----------------+-------------+----------+--------------+ Prox forearm                            not visualized +-----------------+-------------+----------+--------------+ Mid forearm          0.21                 branching    +-----------------+-------------+----------+--------------+ Dist forearm         0.21                              +-----------------+-------------+----------+--------------+ Wrist                0.20                              +-----------------+-------------+----------+--------------+ +-----------------+-------------+----------+--------+ Right Basilic    Diameter (cm)Depth (cm)Findings +-----------------+-------------+----------+--------+ Shoulder             0.44                        +-----------------+-------------+----------+--------+ Prox upper arm       0.44                        +-----------------+-------------+----------+--------+ Mid upper arm        0.37                        +-----------------+-------------+----------+--------+ Dist upper arm       0.45                        +-----------------+-------------+----------+--------+ Antecubital fossa    0.41                        +-----------------+-------------+----------+--------+ Prox forearm         0.27                        +-----------------+-------------+----------+--------+  Mid forearm          0.18                        +-----------------+-------------+----------+--------+ Distal  forearm       0.13                        +-----------------+-------------+----------+--------+ Wrist                0.17                        +-----------------+-------------+----------+--------+ +-----------------+-------------+----------+--------+ Left Cephalic    Diameter (cm)Depth (cm)Findings +-----------------+-------------+----------+--------+ Prox upper arm       0.20                        +-----------------+-------------+----------+--------+ Mid upper arm        0.12                        +-----------------+-------------+----------+--------+ Dist upper arm       0.18                        +-----------------+-------------+----------+--------+ Antecubital fossa    0.19                        +-----------------+-------------+----------+--------+ Prox forearm         0.17                        +-----------------+-------------+----------+--------+ Mid forearm          0.18                        +-----------------+-------------+----------+--------+ Dist forearm         0.15                        +-----------------+-------------+----------+--------+ Wrist                0.17                        +-----------------+-------------+----------+--------+ +-----------------+-------------+----------+--------------+ Left Basilic     Diameter (cm)Depth (cm)   Findings    +-----------------+-------------+----------+--------------+ Prox upper arm       0.49                              +-----------------+-------------+----------+--------------+ Mid upper arm        0.36                              +-----------------+-------------+----------+--------------+ Dist upper arm       0.35                              +-----------------+-------------+----------+--------------+ Antecubital fossa    0.29                 branching    +-----------------+-------------+----------+--------------+ Prox forearm         0.13                               +-----------------+-------------+----------+--------------+  Mid forearm                             not visualized +-----------------+-------------+----------+--------------+ Distal forearm                          not visualized +-----------------+-------------+----------+--------------+ *See table(s) above for measurements and observations.  Diagnosing physician: Servando Snare MD Electronically signed by Servando Snare MD on 09/06/2020 at 2:41:12 PM.    Final      Medications:    [START ON 09/09/2020] cefUROXime (ZINACEF)  IV      amLODipine  10 mg Oral Daily   arformoterol  15 mcg Nebulization BID   And   umeclidinium bromide  1 puff Inhalation Daily   aspirin  81 mg Oral Daily   atorvastatin  80 mg Oral Daily   carvedilol  25 mg Oral BID WC   cloNIDine  0.2 mg Transdermal Weekly   darbepoetin (ARANESP) injection - NON-DIALYSIS  40 mcg Subcutaneous Q Sat-1800   doxazosin  16 mg Oral QHS   feeding supplement  237 mL Oral BID BM   heparin  5,000 Units Subcutaneous Q12H   hydrALAZINE  50 mg Oral TID   isosorbide mononitrate  30 mg Oral Daily   lidocaine  1 patch Transdermal Q24H   minoxidil  20 mg Oral Daily   multivitamin  1 tablet Oral QHS   pantoprazole  40 mg Oral Daily   acetaminophen, albuterol, nitroGLYCERIN, oxyCODONE, polyvinyl alcohol, prochlorperazine  Assessment/ Plan:  1.Renal-stage V chronic kidney disease.  It appears that patient is going to need to have vascular access.  Appreciate assistance from vein and vascular surgery.  Placement of catheter and fistula 09/09/2020    Appreciate assistance from social worker Ms. Terri Piedra 2. Hypertension/volume  -appears to be well controlled today on multiple medications. 3.  Anemia iron studies intact will start ESA 4.  Bones PTH 132    LOS: 2 Sherril Croon @TODAY @9 :29 AM

## 2020-09-08 NOTE — Progress Notes (Addendum)
Progress Note  Patient Name: [name] Date of Encounter: 09/08/2020  [facility] Cardiologist: [name], MD   Subjective   BP down to 88/51 yesterday.  Clonidine patch was removed.  P.m. dose of carvedilol and hydralazine were held.  BP 144/78 this morning.  Reports has continued to have intermittent chest pain, denies any shortness of breath  Inpatient Medications    Scheduled Meds:  amLODipine  10 mg Oral Daily   arformoterol  15 mcg Nebulization BID   And   umeclidinium bromide  1 puff Inhalation Daily   aspirin  81 mg Oral Daily   atorvastatin  80 mg Oral Daily   carvedilol  50 mg Oral BID WC   [START ON 09/09/2020] cloNIDine  0.1 mg Transdermal Weekly   darbepoetin (ARANESP) injection - NON-DIALYSIS  40 mcg Subcutaneous Q Sat-1800   doxazosin  16 mg Oral QHS   feeding supplement  237 mL Oral BID BM   heparin  5,000 Units Subcutaneous Q12H   hydrALAZINE  75 mg Oral TID   isosorbide mononitrate  30 mg Oral Daily   lidocaine  1 patch Transdermal Q24H   minoxidil  20 mg Oral Daily   multivitamin  1 tablet Oral QHS   pantoprazole  40 mg Oral Daily   Continuous Infusions:  PRN Meds: acetaminophen, albuterol, nitroGLYCERIN, oxyCODONE, polyvinyl alcohol, prochlorperazine   Vital Signs    Vitals:   09/07/20 1943 09/07/20 2224 09/08/20 0337 09/08/20 0338  BP:  119/71  121/73  Pulse:  83 87 89  Resp: 16  16 14   Temp:  97.8 F (36.6 C) 98.6 F (37 C)   TempSrc:  Oral Oral   SpO2: 98% 95% 98% 100%  Weight:      Height:        Intake/Output Summary (Last 24 hours) at 09/08/2020 0749 Last data filed at 09/07/2020 2234 Gross per 24 hour  Intake --  Output 300 ml  Net -300 ml    Last 3 Weights 09/02/2020 08/30/2020 07/23/2020  Weight (lbs) 152 lb 152 lb 12.8 oz 158 lb 12.8 oz  Weight (kg) 68.947 kg 69.31 kg 72.031 kg      Telemetry    Sinus rhythm HR 80s - Personally Reviewed  ECG    No new tracings - Personally Reviewed  Physical Exam    GEN: No acute distress.   Neck: No JVD Cardiac: RRR, 3/6 systolic murmur  Respiratory: Clear to auscultation bilaterally. GI: Soft, nontender, non-distended  MS: No edema; No deformity. Neuro:  Nonfocal  Psych: Normal affect   Labs    High Sensitivity Troponin:   Recent Labs  Lab 09/02/20 0118 09/02/20 0308 09/02/20 0840  TROPONINIHS 85* 79* 76*       Chemistry Recent Labs  Lab 09/05/20 1150 09/06/20 1009 09/07/20 0206 09/08/20 0257  NA 136 136 136 136  K 4.3 4.0 4.0 4.3  CL 106 106 106 106  CO2 23 22 23 22   GLUCOSE 125* 145* 107* 98  BUN 59* 65* 63* 64*  CREATININE 8.56* 9.10* 9.58* 9.85*  CALCIUM 8.8* 8.6* 8.7* 8.9  PROT 5.5*  --   --   --   ALBUMIN 3.0*  --  2.9* 3.1*  AST 19  --   --   --   ALT 14  --   --   --   ALKPHOS 59  --   --   --   BILITOT 0.7  --   --   --  GFRNONAA 7* 7* 6* 6*  ANIONGAP 7 8 7 8       Hematology Recent Labs  Lab 09/02/20 0118 09/05/20 1150 09/08/20 0257  WBC 4.6 3.2* 3.9*  RBC 3.84* 2.94* 2.79*  HGB 11.8* 9.0* 8.7*  HCT 36.2* 27.0* 25.3*  MCV 94.3 91.8 90.7  MCH 30.7 30.6 31.2  MCHC 32.6 33.3 34.4  RDW 13.7 12.7 12.8  PLT 78* 67* 68*     BNPNo results for input(s): BNP, PROBNP in the last 168 hours.   DDimer No results for input(s): DDIMER in the last 168 hours.   Radiology    VAS [name] UPPER EXT VEIN MAPPING (PRE-OP AVF)  Result Date: 09/06/2020 UPPER EXTREMITY VEIN MAPPING Patient Name:  [name]  Date of Exam:   09/06/2020 Medical Rec #: [id number]      Accession #:    [id number] Date of Birth: [birth year]       Patient Gender: M Patient Age:   50Y Exam Location:  [facility] Procedure:      VAS [name] UPPER EXT VEIN MAPPING (PRE-OP AVF) Referring Phys: [id number] SAMANTHA J RHYNE --------------------------------------------------------------------------------  Indications: Pre-access. Comparison Study: No prior Performing Technologist: Oda Cogan RDMS, RVT  Examination Guidelines: A complete evaluation  includes B-mode imaging, spectral Doppler, color Doppler, and power Doppler as needed of all accessible portions of each vessel. Bilateral testing is considered an integral part of a complete examination. Limited examinations for reoccurring indications may be performed as noted. +-----------------+-------------+----------+--------------+ Right Cephalic   Diameter (cm)Depth (cm)   Findings    +-----------------+-------------+----------+--------------+ Shoulder                                not visualized +-----------------+-------------+----------+--------------+ Prox upper arm                          not visualized +-----------------+-------------+----------+--------------+ Mid upper arm                           not visualized +-----------------+-------------+----------+--------------+ Dist upper arm                          not visualized +-----------------+-------------+----------+--------------+ Antecubital fossa                       not visualized +-----------------+-------------+----------+--------------+ Prox forearm                            not visualized +-----------------+-------------+----------+--------------+ Mid forearm          0.21                 branching    +-----------------+-------------+----------+--------------+ Dist forearm         0.21                              +-----------------+-------------+----------+--------------+ Wrist                0.20                              +-----------------+-------------+----------+--------------+ +-----------------+-------------+----------+--------+ Right Basilic    Diameter (cm)Depth (cm)Findings +-----------------+-------------+----------+--------+ Shoulder  0.44                        +-----------------+-------------+----------+--------+ Prox upper arm       0.44                        +-----------------+-------------+----------+--------+ Mid upper arm        0.37                         +-----------------+-------------+----------+--------+ Dist upper arm       0.45                        +-----------------+-------------+----------+--------+ Antecubital fossa    0.41                        +-----------------+-------------+----------+--------+ Prox forearm         0.27                        +-----------------+-------------+----------+--------+ Mid forearm          0.18                        +-----------------+-------------+----------+--------+ Distal forearm       0.13                        +-----------------+-------------+----------+--------+ Wrist                0.17                        +-----------------+-------------+----------+--------+ +-----------------+-------------+----------+--------+ Left Cephalic    Diameter (cm)Depth (cm)Findings +-----------------+-------------+----------+--------+ Prox upper arm       0.20                        +-----------------+-------------+----------+--------+ Mid upper arm        0.12                        +-----------------+-------------+----------+--------+ Dist upper arm       0.18                        +-----------------+-------------+----------+--------+ Antecubital fossa    0.19                        +-----------------+-------------+----------+--------+ Prox forearm         0.17                        +-----------------+-------------+----------+--------+ Mid forearm          0.18                        +-----------------+-------------+----------+--------+ Dist forearm         0.15                        +-----------------+-------------+----------+--------+ Wrist                0.17                        +-----------------+-------------+----------+--------+ +-----------------+-------------+----------+--------------+ Left Basilic  Diameter (cm)Depth (cm)   Findings    +-----------------+-------------+----------+--------------+ Prox upper arm        0.49                              +-----------------+-------------+----------+--------------+ Mid upper arm        0.36                              +-----------------+-------------+----------+--------------+ Dist upper arm       0.35                              +-----------------+-------------+----------+--------------+ Antecubital fossa    0.29                 branching    +-----------------+-------------+----------+--------------+ Prox forearm         0.13                              +-----------------+-------------+----------+--------------+ Mid forearm                             not visualized +-----------------+-------------+----------+--------------+ Distal forearm                          not visualized +-----------------+-------------+----------+--------------+ *See table(s) above for measurements and observations.  Diagnosing physician: Servando Snare MD Electronically signed by Servando Snare MD on 09/06/2020 at 2:41:12 PM.    Final     Cardiac Studies   Echo 09/03/20:  1. 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 S/P AVR with  elevated mean gradient of 33.5 mmHg (similar to gradient of 31 mmHg on  05/18/20 study).   2. Left ventricular ejection fraction, by estimation, is >75%. The left  ventricle has hyperdynamic function. The left ventricle has no regional  wall motion abnormalities. Left ventricular diastolic parameters are  consistent with Grade I diastolic  dysfunction (impaired relaxation). Elevated left atrial pressure.   3. Right ventricular systolic function is normal. The right ventricular  size is normal.   4. Left atrial size was moderately dilated.   5. Right atrial size was moderately dilated.   6. The mitral valve is normal in structure. Trivial mitral valve  regurgitation. No evidence of mitral stenosis.   7. The aortic valve has been repaired/replaced. Aortic valve  regurgitation is not visualized. Moderate aortic valve  stenosis. There is  a bioprosthetic valve present in the aortic position.   Patient Profile     50 y.o. 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   Assessment & Plan    Persistent chest pain Known CAD - hs troponin low and flat - CKD stage V - inflammatory markers are WNL makign pericarditis/myocarditis less likely - echo with grade 1DD, hyperdynamic LVEF, moderate biatrial enlargement - has been intolerant to imdur in the past - tender to palpation in left upper chest - No further cardiac work-up recommended  Cardiomyopathy  Chronic combined systolic and diastolic  heart failure - EF hyperdynamic with grade 1 DD - previously EF was 35-40% in 2013, then normalized in 2016, reduced to 35-40% in 2019, normalized again in 05/2020 - GDMT limited by renal disease - does not appear volume up - echo with concern for speckled appearance, PYP scan in 2020 negative for amyloid.  Would plan repeat evaluation, will check light chains/SPEP/UPEP and can plan repeat PYP as outpatient.  - suspect role for prior ETOH use in CM  Hypertension - On amlodipine, hydralazine, clonidine, doxazosin, coreg, imdur.  Hypotensive yesterday and clonidine patch was removed.  Would be hesitant to stop clonidine given concern for rebound hypertension.  Will restart clonidine patch at 0.2.  Will reduce hydralazine to 50 mg 3 times daily.  Reduce coreg dose to 25 mg BID.  ESRD - sCr has risen to 9.85, planning to start HD  AAA s/p [place] - 11/2014 at [facility] bioprosthetic aortic valve.  Now with moderate AS - last evaluation with CTA 11/2019  Hx of CVA - s/p ILR in place   For questions or updates, please contact [facility] Please consult  [email or web address] for contact info under      Signed, Donato Heinz, MD  09/08/2020, 7:49 AM

## 2020-09-09 ENCOUNTER — Inpatient Hospital Stay (HOSPITAL_COMMUNITY): Payer: Medicare HMO | Admitting: Certified Registered Nurse Anesthetist

## 2020-09-09 ENCOUNTER — Encounter (HOSPITAL_COMMUNITY)
Admission: EM | Disposition: A | Discharge status: Home Health | Payer: Self-pay | Source: Home / Self Care | Attending: Family Medicine

## 2020-09-09 ENCOUNTER — Inpatient Hospital Stay (HOSPITAL_COMMUNITY): Payer: Medicare HMO

## 2020-09-09 ENCOUNTER — Encounter (HOSPITAL_COMMUNITY): Payer: Self-pay | Admitting: Family Medicine

## 2020-09-09 DIAGNOSIS — Z992 Dependence on renal dialysis: Secondary | ICD-10-CM

## 2020-09-09 DIAGNOSIS — N186 End stage renal disease: Secondary | ICD-10-CM

## 2020-09-09 HISTORY — PX: INSERTION OF DIALYSIS CATHETER: SHX1324

## 2020-09-09 HISTORY — PX: AV FISTULA PLACEMENT: SHX1204

## 2020-09-09 LAB — SURGICAL PCR SCREEN
MRSA, PCR: NEGATIVE
Staphylococcus aureus: NEGATIVE

## 2020-09-09 LAB — RENAL FUNCTION PANEL
Albumin: 3.1 g/dL — ABNORMAL LOW (ref 3.5–5.0)
Anion gap: 10 (ref 5–15)
BUN: 68 mg/dL — ABNORMAL HIGH (ref 6–20)
CO2: 21 mmol/L — ABNORMAL LOW (ref 22–32)
Calcium: 9 mg/dL (ref 8.9–10.3)
Chloride: 103 mmol/L (ref 98–111)
Creatinine, Ser: 9.56 mg/dL — ABNORMAL HIGH (ref 0.61–1.24)
GFR, Estimated: 6 mL/min — ABNORMAL LOW (ref >60)
Glucose, Bld: 81 mg/dL (ref 70–99)
Phosphorus: 6.2 mg/dL — ABNORMAL HIGH (ref 2.5–4.6)
Potassium: 4.4 mmol/L (ref 3.5–5.1)
Sodium: 134 mmol/L — ABNORMAL LOW (ref 135–145)

## 2020-09-09 SURGERY — ARTERIOVENOUS (AV) FISTULA CREATION
Anesthesia: Monitor Anesthesia Care

## 2020-09-09 MED ORDER — FENTANYL CITRATE (PF) 100 MCG/2ML IJ SOLN
INTRAMUSCULAR | Status: AC
  Filled 2020-09-09: qty 2

## 2020-09-09 MED ORDER — PHENYLEPHRINE HCL-NACL 10-0.9 MG/250ML-% IV SOLN
INTRAVENOUS | Status: DC | PRN
  Administered 2020-09-09: 25 ug/min via INTRAVENOUS

## 2020-09-09 MED ORDER — SODIUM CHLORIDE 0.9 % IV SOLN
INTRAVENOUS | Status: AC
  Filled 2020-09-09: qty 1.2

## 2020-09-09 MED ORDER — ACETAMINOPHEN 10 MG/ML IV SOLN
1000.0000 mg | Freq: Once | INTRAVENOUS | Status: DC | PRN
  Administered 2020-09-09: 1000 mg via INTRAVENOUS

## 2020-09-09 MED ORDER — DARBEPOETIN ALFA 60 MCG/0.3ML IJ SOSY: 60.0000 ug | PREFILLED_SYRINGE | INTRAMUSCULAR | Status: DC

## 2020-09-09 MED ORDER — OXYCODONE HCL 5 MG PO TABS
ORAL_TABLET | ORAL | Status: AC
  Filled 2020-09-09: qty 1

## 2020-09-09 MED ORDER — ACETAMINOPHEN 160 MG/5ML PO SOLN: 1000.0000 mg | Freq: Once | ORAL | Status: DC | PRN

## 2020-09-09 MED ORDER — MIDAZOLAM HCL 2 MG/2ML IJ SOLN
INTRAMUSCULAR | Status: DC | PRN
  Administered 2020-09-09: 2 mg via INTRAVENOUS

## 2020-09-09 MED ORDER — CHLORHEXIDINE GLUCONATE CLOTH 2 % EX PADS: 6.0000 | MEDICATED_PAD | Freq: Every day | CUTANEOUS | Status: DC

## 2020-09-09 MED ORDER — LIDOCAINE HCL (PF) 1 % IJ SOLN
INTRAMUSCULAR | Status: AC
  Filled 2020-09-09: qty 30

## 2020-09-09 MED ORDER — BISACODYL 5 MG PO TBEC
10.0000 mg | DELAYED_RELEASE_TABLET | Freq: Every day | ORAL | Status: AC | PRN
  Administered 2020-09-09: 10 mg via ORAL
  Filled 2020-09-09: qty 2

## 2020-09-09 MED ORDER — HEMOSTATIC AGENTS (NO CHARGE) OPTIME
TOPICAL | Status: DC | PRN
  Administered 2020-09-09: 1 via TOPICAL

## 2020-09-09 MED ORDER — LIDOCAINE HCL (PF) 1 % IJ SOLN
INTRAMUSCULAR | Status: DC | PRN
  Administered 2020-09-09: 11 mL

## 2020-09-09 MED ORDER — ORAL CARE MOUTH RINSE: 15.0000 mL | Freq: Once | OROMUCOSAL | Status: AC

## 2020-09-09 MED ORDER — FENTANYL CITRATE (PF) 250 MCG/5ML IJ SOLN
INTRAMUSCULAR | Status: AC
  Filled 2020-09-09: qty 5

## 2020-09-09 MED ORDER — ACETAMINOPHEN 500 MG PO TABS: 1000.0000 mg | ORAL_TABLET | Freq: Once | ORAL | Status: DC | PRN

## 2020-09-09 MED ORDER — PHENYLEPHRINE HCL (PRESSORS) 10 MG/ML IV SOLN
INTRAVENOUS | Status: AC
  Filled 2020-09-09: qty 1

## 2020-09-09 MED ORDER — ACETAMINOPHEN 10 MG/ML IV SOLN
INTRAVENOUS | Status: AC
  Filled 2020-09-09: qty 100

## 2020-09-09 MED ORDER — HEPARIN SODIUM (PORCINE) 1000 UNIT/ML IJ SOLN
INTRAMUSCULAR | Status: AC
  Filled 2020-09-09: qty 1

## 2020-09-09 MED ORDER — PHENYLEPHRINE 40 MCG/ML (10ML) SYRINGE FOR IV PUSH (FOR BLOOD PRESSURE SUPPORT)
PREFILLED_SYRINGE | INTRAVENOUS | Status: DC | PRN
Start: 2020-09-09 — End: 2020-09-09
  Administered 2020-09-09 (×5): 80 ug via INTRAVENOUS

## 2020-09-09 MED ORDER — FENTANYL CITRATE (PF) 100 MCG/2ML IJ SOLN
25.0000 ug | INTRAMUSCULAR | Status: DC | PRN
  Administered 2020-09-09: 25 ug via INTRAVENOUS
  Administered 2020-09-09: 50 ug via INTRAVENOUS

## 2020-09-09 MED ORDER — MIDAZOLAM HCL 2 MG/2ML IJ SOLN
INTRAMUSCULAR | Status: AC
  Filled 2020-09-09: qty 2

## 2020-09-09 MED ORDER — CHLORHEXIDINE GLUCONATE 0.12 % MT SOLN
OROMUCOSAL | Status: AC
  Administered 2020-09-09: 15 mL via OROMUCOSAL
  Filled 2020-09-09: qty 15

## 2020-09-09 MED ORDER — OXYCODONE HCL 5 MG/5ML PO SOLN: 5.0000 mg | Freq: Once | ORAL | Status: AC | PRN

## 2020-09-09 MED ORDER — CHLORHEXIDINE GLUCONATE CLOTH 2 % EX PADS
6.0000 | MEDICATED_PAD | Freq: Every day | CUTANEOUS | Status: DC
  Administered 2020-09-09: 6 via TOPICAL

## 2020-09-09 MED ORDER — SODIUM CHLORIDE 0.9 % IV SOLN: INTRAVENOUS | Status: DC

## 2020-09-09 MED ORDER — 0.9 % SODIUM CHLORIDE (POUR BTL) OPTIME
TOPICAL | Status: DC | PRN
  Administered 2020-09-09: 1000 mL

## 2020-09-09 MED ORDER — MUPIROCIN 2 % EX OINT
1.0000 "application " | TOPICAL_OINTMENT | Freq: Two times a day (BID) | CUTANEOUS | Status: DC
  Administered 2020-09-09 – 2020-09-11 (×5): 1 via NASAL
  Filled 2020-09-09 (×4): qty 22

## 2020-09-09 MED ORDER — OXYCODONE HCL 5 MG PO TABS
5.0000 mg | ORAL_TABLET | Freq: Once | ORAL | Status: AC | PRN
Start: 2020-09-09 — End: 2020-09-09
  Administered 2020-09-09: 5 mg via ORAL

## 2020-09-09 MED ORDER — PROPOFOL 500 MG/50ML IV EMUL
INTRAVENOUS | Status: DC | PRN
  Administered 2020-09-09: 75 ug/kg/min via INTRAVENOUS

## 2020-09-09 MED ORDER — SODIUM CHLORIDE 0.9 % IV SOLN
250.0000 mg | Freq: Every day | INTRAVENOUS | Status: AC
  Administered 2020-09-10 – 2020-09-11 (×2): 250 mg via INTRAVENOUS
  Filled 2020-09-09 (×2): qty 20

## 2020-09-09 MED ORDER — HEPARIN SODIUM (PORCINE) 1000 UNIT/ML IJ SOLN
INTRAMUSCULAR | Status: DC | PRN
  Administered 2020-09-09: 1000 [IU] via INTRAVENOUS

## 2020-09-09 MED ORDER — EPHEDRINE SULFATE-NACL 50-0.9 MG/10ML-% IV SOSY
PREFILLED_SYRINGE | INTRAVENOUS | Status: DC | PRN
  Administered 2020-09-09 (×5): 5 mg via INTRAVENOUS

## 2020-09-09 MED ORDER — EPHEDRINE 5 MG/ML INJ
INTRAVENOUS | Status: AC
  Filled 2020-09-09: qty 10

## 2020-09-09 MED ORDER — ALBUMIN HUMAN 5 % IV SOLN: INTRAVENOUS | Status: DC | PRN

## 2020-09-09 MED ORDER — SODIUM CHLORIDE 0.9 % IV SOLN: INTRAVENOUS | Status: DC | PRN

## 2020-09-09 MED ORDER — VASOPRESSIN 20 UNIT/ML IV SOLN
INTRAVENOUS | Status: AC
  Filled 2020-09-09: qty 1

## 2020-09-09 MED ORDER — VASOPRESSIN 20 UNIT/ML IV SOLN
INTRAVENOUS | Status: DC | PRN
  Administered 2020-09-09 (×3): 1 [IU] via INTRAVENOUS

## 2020-09-09 MED ORDER — PHENYLEPHRINE 40 MCG/ML (10ML) SYRINGE FOR IV PUSH (FOR BLOOD PRESSURE SUPPORT)
PREFILLED_SYRINGE | INTRAVENOUS | Status: AC
  Filled 2020-09-09: qty 10

## 2020-09-09 MED ORDER — CHLORHEXIDINE GLUCONATE 0.12 % MT SOLN: 15.0000 mL | Freq: Once | OROMUCOSAL | Status: AC

## 2020-09-09 SURGICAL SUPPLY — 63 items
ADH SKN CLS APL DERMABOND .7 (GAUZE/BANDAGES/DRESSINGS) ×4
APL PRP STRL LF DISP 70% ISPRP (MISCELLANEOUS) ×4
APL SKNCLS STERI-STRIP NONHPOA (GAUZE/BANDAGES/DRESSINGS)
ARMBAND PINK RESTRICT EXTREMIT (MISCELLANEOUS) ×3 IMPLANT
BAG DECANTER FOR FLEXI CONT (MISCELLANEOUS) ×3 IMPLANT
BENZOIN TINCTURE PRP APPL 2/3 (GAUZE/BANDAGES/DRESSINGS) ×2 IMPLANT
BIOPATCH RED 1 DISK 7.0 (GAUZE/BANDAGES/DRESSINGS) ×3 IMPLANT
CANISTER SUCT 3000ML PPV (MISCELLANEOUS) ×3 IMPLANT
CANNULA VESSEL 3MM 2 BLNT TIP (CANNULA) ×3 IMPLANT
CATH PALINDROME-P 19CM W/VT (CATHETERS) ×1 IMPLANT
CATH PALINDROME-P 23CM W/VT (CATHETERS) IMPLANT
CATH PALINDROME-P 28CM W/VT (CATHETERS) IMPLANT
CHLORAPREP W/TINT 26 (MISCELLANEOUS) ×4 IMPLANT
CLIP VESOCCLUDE MED 6/CT (CLIP) ×3 IMPLANT
CLIP VESOCCLUDE SM WIDE 6/CT (CLIP) ×3 IMPLANT
COVER PROBE W GEL 5X96 (DRAPES) ×3 IMPLANT
COVER SURGICAL LIGHT HANDLE (MISCELLANEOUS) ×2 IMPLANT
DERMABOND ADVANCED (GAUZE/BANDAGES/DRESSINGS) ×2
DERMABOND ADVANCED .7 DNX12 (GAUZE/BANDAGES/DRESSINGS) IMPLANT
DRAPE C-ARM 42X72 X-RAY (DRAPES) ×2 IMPLANT
DRAPE CHEST BREAST 15X10 FENES (DRAPES) ×2 IMPLANT
DRAPE EXTREMITY T 121X128X90 (DISPOSABLE) ×3 IMPLANT
ELECT REM PT RETURN 9FT ADLT (ELECTROSURGICAL) ×3
ELECTRODE REM PT RTRN 9FT ADLT (ELECTROSURGICAL) ×2 IMPLANT
GAUZE 4X4 16PLY RFD (DISPOSABLE) ×3 IMPLANT
GAUZE SPONGE 4X4 12PLY STRL (GAUZE/BANDAGES/DRESSINGS) ×3 IMPLANT
GLOVE SURG POLYISO LF SZ8 (GLOVE) ×3 IMPLANT
GOWN STRL REUS W/ TWL LRG LVL3 (GOWN DISPOSABLE) ×4 IMPLANT
GOWN STRL REUS W/ TWL XL LVL3 (GOWN DISPOSABLE) ×2 IMPLANT
GOWN STRL REUS W/TWL LRG LVL3 (GOWN DISPOSABLE) ×6
GOWN STRL REUS W/TWL XL LVL3 (GOWN DISPOSABLE) ×6
HEMOSTAT SNOW SURGICEL 2X4 (HEMOSTASIS) ×1 IMPLANT
INSERT FOGARTY SM (MISCELLANEOUS) IMPLANT
KIT BASIN OR (CUSTOM PROCEDURE TRAY) ×3 IMPLANT
KIT MICROPUNCTURE NIT STIFF (SHEATH) ×1 IMPLANT
KIT PALINDROME-P 55CM (CATHETERS) IMPLANT
KIT TURNOVER KIT B (KITS) ×3 IMPLANT
NDL 18GX1X1/2 (RX/OR ONLY) (NEEDLE) ×2 IMPLANT
NDL HYPO 25GX1X1/2 BEV (NEEDLE) ×2 IMPLANT
NEEDLE 18GX1X1/2 (RX/OR ONLY) (NEEDLE) ×3 IMPLANT
NEEDLE HYPO 25GX1X1/2 BEV (NEEDLE) ×3 IMPLANT
NS IRRIG 1000ML POUR BTL (IV SOLUTION) ×3 IMPLANT
PACK CV ACCESS (CUSTOM PROCEDURE TRAY) ×3 IMPLANT
PACK SURGICAL SETUP 50X90 (CUSTOM PROCEDURE TRAY) ×3 IMPLANT
PAD ARMBOARD 7.5X6 YLW CONV (MISCELLANEOUS) ×6 IMPLANT
SET MICROPUNCTURE 5F STIFF (MISCELLANEOUS) ×3 IMPLANT
SOAP 2 % CHG 4 OZ (WOUND CARE) ×2 IMPLANT
STRIP CLOSURE SKIN 1/2X4 (GAUZE/BANDAGES/DRESSINGS) ×2 IMPLANT
SUT ETHILON 3 0 PS 1 (SUTURE) ×3 IMPLANT
SUT MNCRL AB 4-0 PS2 18 (SUTURE) ×3 IMPLANT
SUT PROLENE 6 0 BV (SUTURE) ×3 IMPLANT
SUT PROLENE 6 0 CC (SUTURE) ×2 IMPLANT
SUT VIC AB 3-0 SH 27 (SUTURE) ×3
SUT VIC AB 3-0 SH 27X BRD (SUTURE) ×2 IMPLANT
SYR 10ML LL (SYRINGE) ×3 IMPLANT
SYR 20ML LL LF (SYRINGE) ×6 IMPLANT
SYR 3ML LL SCALE MARK (SYRINGE) IMPLANT
SYR 5ML LL (SYRINGE) ×3 IMPLANT
SYR CONTROL 10ML LL (SYRINGE) ×3 IMPLANT
TOWEL GREEN STERILE (TOWEL DISPOSABLE) ×3 IMPLANT
TOWEL GREEN STERILE FF (TOWEL DISPOSABLE) ×6 IMPLANT
UNDERPAD 30X36 HEAVY ABSORB (UNDERPADS AND DIAPERS) ×3 IMPLANT
WATER STERILE IRR 1000ML POUR (IV SOLUTION) ×3 IMPLANT

## 2020-09-09 NOTE — Op Note (Signed)
DATE OF SERVICE: 09/09/2020  PATIENT:  [name]  50 y.o. male  PRE-OPERATIVE DIAGNOSIS:  stage V chronic kidney disease  POST-OPERATIVE DIAGNOSIS:  Same  PROCEDURE:   1) placement of 19cm tunneled dialysis catheter (right internal jugular vein) 2) left first stage brachio-basilic arteriovenous fistula  SURGEON:  Surgeon(s) and Role:    * Cherre Robins, MD - Primary  ASSISTANT: Gerri Lins, PA-C  An assistant was required to facilitate exposure and expedite the case.  ANESTHESIA:   local and MAC  EBL: min  BLOOD ADMINISTERED:none  DRAINS: none   LOCAL MEDICATIONS USED:  LIDOCAINE   SPECIMEN:  none  COUNTS: confirmed correct.  TOURNIQUET:  none  PATIENT DISPOSITION:  PACU - hemodynamically stable.   Delay start of Pharmacological VTE agent (>24hrs) due to surgical blood loss or risk of bleeding: no  INDICATION FOR PROCEDURE: [name] is a 50 y.o. male with CKDV. After careful discussion of risks, benefits, and alternatives the patient was offered tunneled dialysis catheter placement and fistula creation. We specifically discussed risk of steal syndrome. The patient understood and wished to proceed.  OPERATIVE FINDINGS: unremarkable RIJ TDC. Left basilic vein healthy. Left brachial artery generous. Palpable left radial pulse and thrill upon completion.  DESCRIPTION OF PROCEDURE: After identification of the patient in the pre-operative holding area, the patient was transferred to the operating room. The patient was positioned supine on the operating room table. Anesthesia was induced. The neck and left arm were prepped and draped in standard fashion. A surgical pause was performed confirming correct patient, procedure, and operative location.  Using ultrasound guidance the right internal jugular vein was accessed with micropuncture technique.  Through the micropuncture sheath a floppy J-wire was advanced into the superior vena cava.  A small incision was made  around the skin access point.  The access point was serially dilated under direct fluoroscopic guidance.  A peel-away sheath was introduced into the superior vena cava under fluoroscopic guidance.  A counterincision was made in the chest under the clavicle.  A 23 cm tunnel dialysis catheter was then tunneled under the skin, over the clavicle into the incision in the neck.  The tunneling device was removed and the catheter fed through the peel-away sheath into the superior vena cava.  The peel-away sheath was removed and the catheter gently pulled back.  Adequate position was confirmed with x-ray.  The catheter was tested and found to flush and draw back well.  Catheter was heparin locked.  Caps were applied.  Catheter was sutured to the skin.  The neck incision was closed with 4-0 Monocryl.  Using intraoperative ultrasound the left brachial artery and basilic vein were mapped.  An incision was planned over the course of the two vessels to allow fistula creation.  Incision was created.  Incision was carried down through subcutaneous tissue.  The aponeurosis of the biceps tendon was divided.  The brachial sheath was identified.  The brachial artery was skeletonized.  The artery was encircled with 2 Silastic Vesseloops.  Next attention was turned to the basilic vein.  This was identified in the medial arm in its typical position.  The vein was mobilized throughout the length of the incision to allow tension-free arteriovenous fistula.  The distal end of the vein was clamped with a right angle.  The proximal end of the vein was clamped with a bulldog.  The vein was transected distally.  The stump was oversewn with a 2-0 silk.  The cut  end of the vein was spatulated and distended with a mosquito clamp.  The brachial artery was clamped proximally and distally.  The basilic vein was anastomosed to the brachial artery end-to-side using continuous running suture of 6-0 Prolene.  Immediately prior to completion the  anastomosis was flushed and de-aired.  The anastomosis was completed.  Clamps were released.  Hemostasis was achieved.  An audible bruit was heard in the fistula.  Palpable pulse was felt in the wrist.  Stasis was achieved in the surgical bed.  The wound was closed with 3-0 Vicryl and 4-0 Monocryl.  Upon completion of the case instrument and sharps counts were confirmed correct. The patient was transferred to the PACU in good condition. I was present for all portions of the procedure.  [name]. Stanford Breed, MD [facility] Phone Number: [phone] 09/09/2020 11:34 AM

## 2020-09-09 NOTE — Progress Notes (Signed)
0530: Pt c/o left upper CP 10/10.  Asking for pain medication, stating "my heart hurts, I can't breathe, I feel like I'm dying."  EKG obtained, and patient drifting off to sleep.  No significant changes noted on EKG.  Voltaren gel applied to left upper chest.  Patient informed that his oxycodone was last given at 0318, and he can have it again at 0718.  Patient verbalizes understanding and states that his pain has now resolved.  He describes it as "coming and going." 0600: Patient asleep with NAD noted.  Will continue to monitor.  Jodell Cipro

## 2020-09-09 NOTE — Transfer of Care (Signed)
Immediate Anesthesia Transfer of Care Note  Patient: [name]  Procedure(s) Performed: LEFT ARM ARTERIOVENOUS (AV) FISTULA CREATION (Left) INSERTION OF TUNNELED DIALYSIS PALINDROME 19cm CATHETER  Patient Location: PACU  Anesthesia Type:MAC  Level of Consciousness: drowsy and patient cooperative  Airway & Oxygen Therapy: Patient Spontanous Breathing and Patient connected to face mask oxygen  Post-op Assessment: Report given to RN, Post -op Vital signs reviewed and stable and Patient moving all extremities  Post vital signs: Reviewed and stable  Last Vitals:  Vitals Value Taken Time  BP 106/56 09/09/20 1141  Temp    Pulse 64 09/09/20 1142  Resp 16 09/09/20 1142  SpO2 100 % 09/09/20 1142  Vitals shown include unvalidated device data.  Last Pain:  Vitals:   09/09/20 0904  TempSrc:   PainSc: 7       Patients Stated Pain Goal: 0 (81/84/03 7543)  Complications: No notable events documented.

## 2020-09-09 NOTE — Plan of Care (Signed)

## 2020-09-09 NOTE — Progress Notes (Signed)
[name]  OEV:[id number] DOB: March 22, 1970 DOA: 09/02/2020 PCP: Kerin Perna, NP    Brief Narrative:  50 year old with a history of CAD status post stenting 2019 and 2020, AAA repair 2016 for chronic dissection, CVA 2017, bioprosthetic AVR 2016, CKD stage IV, HTN, chronic systolic CHF with recovered EF, and recurrent chest pain who presented to the ED 6/20 with chest pain.  Significant Events:  Following admission the patient's renal function was noted to be worsening.  As result Nephrology was consulted and steps are being taken to initiate dialysis treatment.  Consultants:  None  Code Status: FULL CODE  Antimicrobials:  None  DVT prophylaxis: Subcutaneous heparin  Subjective: Afebrile.  Vital signs stable.  Seen in his room postoperatively.  Reports pain in his arm.  Remains groggy and cannot provide full history.  No obvious abnormality of extremity.  Respirations unlabored and calm.  Assessment & Plan:  CKD stage V >newly appreciated ESRD AV fistula and temporary dialysis catheter placed 6/27 with goal to initiate HD as inpatient 6/28  Atypical chest pain Imdur initiated per cardiology -Coreg increased -continue aspirin, statin, beta-blocker -no further work-up indicated presently  Anemia of CKD Care per nephrology  Bioprosthetic AVR and mitral valve Clinically stable with no acute findings on recent echocardiogram  Resistant hypertension Medications being titrated -initiation of dialysis should be helpful  HLD On Repatha as outpatient  History of CVA Has a loop recorder which is being managed by cardiology  Chronic thrombocytopenia Stable  COPD without acute exacerbation  GERD Continue PPI  Acute onset OD blurry vision CT head without new ischemic findings. Seen by ophthalmology, Dr. Stacie Glaze on 09/06/20, appreciate recs: artifical tears OD 2-4x/day PRN, can f/u PRN with regular eye doctor as outpatient and to get updated Mrx/glasses,in addition to BP  control.    Family Communication: Spoke with wife at bedside Status is: Inpatient  Remains inpatient appropriate because:Inpatient level of care appropriate due to severity of illness  Dispo: The patient is from: Home              Anticipated d/c is to: Home              Patient currently is not medically stable to d/c.   Difficult to place patient No   Objective: Blood pressure 113/60, pulse 67, temperature 98 F (36.7 C), temperature source Oral, resp. rate 13, height 5\' 11"  (1.803 m), weight 68.9 kg, SpO2 95 %.  Intake/Output Summary (Last 24 hours) at 09/09/2020 1647 Last data filed at 09/09/2020 1500 Gross per 24 hour  Intake 2161.67 ml  Output 450 ml  Net 1711.67 ml   Filed Weights   09/02/20 0043 09/09/20 0840  Weight: 68.9 kg 68.9 kg    Examination: General: No acute respiratory distress Lungs: Clear to auscultation bilaterally without wheezes or crackles Cardiovascular: Regular rate and rhythm without murmur gallop or rub normal S1 and S2 Abdomen: Nontender, nondistended, soft, bowel sounds positive, no rebound, no ascites, no appreciable mass Extremities: No significant cyanosis, clubbing, or edema bilateral lower extremities  CBC: Recent Labs  Lab 09/05/20 1150 09/08/20 0257  WBC 3.2* 3.9*  HGB 9.0* 8.7*  HCT 27.0* 25.3*  MCV 91.8 90.7  PLT 67* 68*   Basic Metabolic Panel: Recent Labs  Lab 09/07/20 0206 09/08/20 0257 09/09/20 0258  NA 136 136 134*  K 4.0 4.3 4.4  CL 106 106 103  CO2 23 22 21*  GLUCOSE 107* 98 81  BUN 63* 64* 68*  CREATININE 9.58* 9.85* 9.56*  CALCIUM 8.7* 8.9 9.0  PHOS 5.3* 5.5* 6.2*   GFR: Estimated Creatinine Clearance: 9.2 mL/min (A) (by C-G formula based on SCr of 9.56 mg/dL (H)).  Liver Function Tests: Recent Labs  Lab 09/05/20 1150 09/07/20 0206 09/08/20 0257 09/09/20 0258  AST 19  --   --   --   ALT 14  --   --   --   ALKPHOS 59  --   --   --   BILITOT 0.7  --   --   --   PROT 5.5*  --   --   --   ALBUMIN  3.0* 2.9* 3.1* 3.1*     HbA1C: Hgb A1c MFr Bld  Date/Time Value Ref Range Status  07/23/2020 11:10 AM 5.4 4.8 - 5.6 % Final    Comment:    (NOTE) Pre diabetes:          5.7%-6.4%  Diabetes:              >6.4%  Glycemic control for   <7.0% adults with diabetes   03/04/2016 05:12 AM 5.3 4.8 - 5.6 % Final    Comment:    (NOTE)         Pre-diabetes: 5.7 - 6.4         Diabetes: >6.4         Glycemic control for adults with diabetes: <7.0     CBG: Recent Labs  Lab 09/05/20 1244  GLUCAP 124*    Recent Results (from the past 240 hour(s))  Resp Panel by RT-PCR (Flu A&B, Covid) Nasopharyngeal Swab     Status: None   Collection Time: 09/02/20  7:33 PM   Specimen: Nasopharyngeal Swab; Nasopharyngeal(NP) swabs in vial transport medium  Result Value Ref Range Status   SARS Coronavirus 2 by RT PCR NEGATIVE NEGATIVE Final    Comment: (NOTE) SARS-CoV-2 target nucleic acids are NOT DETECTED.  The SARS-CoV-2 RNA is generally detectable in upper respiratory specimens during the acute phase of infection. The lowest concentration of SARS-CoV-2 viral copies this assay can detect is 138 copies/mL. A negative result does not preclude SARS-Cov-2 infection and should not be used as the sole basis for treatment or other patient management decisions. A negative result may occur with  improper specimen collection/handling, submission of specimen other than nasopharyngeal swab, presence of viral mutation(s) within the areas targeted by this assay, and inadequate number of viral copies(<138 copies/mL). A negative result must be combined with clinical observations, patient history, and epidemiological information. The expected result is Negative.  Fact Sheet for Patients:  EntrepreneurPulse.com.au  Fact Sheet for Healthcare Providers:  IncredibleEmployment.be  This test is no t yet approved or cleared by the [place] FDA and  has been authorized  for detection and/or diagnosis of SARS-CoV-2 by FDA under an Emergency Use Authorization (EUA). This EUA will remain  in effect (meaning this test can be used) for the duration of the COVID-19 declaration under Section 564(b)(1) of the Act, 21 U.S.C.section 360bbb-3(b)(1), unless the authorization is terminated  or revoked sooner.       Influenza A by PCR NEGATIVE NEGATIVE Final   Influenza B by PCR NEGATIVE NEGATIVE Final    Comment: (NOTE) The Xpert Xpress SARS-CoV-2/FLU/RSV plus assay is intended as an aid in the diagnosis of influenza from Nasopharyngeal swab specimens and should not be used as a sole basis for treatment. Nasal washings and aspirates are unacceptable for Xpert Xpress SARS-CoV-2/FLU/RSV testing.  Fact Sheet  for Patients: EntrepreneurPulse.com.au  Fact Sheet for Healthcare Providers: IncredibleEmployment.be  This test is not yet approved or cleared by the [place] FDA and has been authorized for detection and/or diagnosis of SARS-CoV-2 by FDA under an Emergency Use Authorization (EUA). This EUA will remain in effect (meaning this test can be used) for the duration of the COVID-19 declaration under Section 564(b)(1) of the Act, 21 U.S.C. section 360bbb-3(b)(1), unless the authorization is terminated or revoked.  Performed at [facility], [place] [address], [place], [place] [address]   Surgical PCR screen     Status: None   Collection Time: 09/09/20  5:49 AM   Specimen: Nasal Mucosa; Nasal Swab  Result Value Ref Range Status   MRSA, PCR NEGATIVE NEGATIVE Final   Staphylococcus aureus NEGATIVE NEGATIVE Final    Comment: (NOTE) The Xpert SA Assay (FDA approved for NASAL specimens in patients 67 years of age and older), is one component of a comprehensive surveillance program. It is not intended to diagnose infection nor to guide or monitor treatment. Performed at [facility], [place] [address]  [place], [place] [address]      Scheduled Meds:  amLODipine  10 mg Oral Daily   arformoterol  15 mcg Nebulization BID   And   umeclidinium bromide  1 puff Inhalation Daily   aspirin  81 mg Oral Daily   atorvastatin  80 mg Oral Daily   carvedilol  25 mg Oral BID WC   Chlorhexidine Gluconate Cloth  6 each Topical Daily   [START ON 09/10/2020] Chlorhexidine Gluconate Cloth  6 each Topical Q0600   cloNIDine  0.2 mg Transdermal Weekly   [START ON 09/14/2020] darbepoetin (ARANESP) injection - DIALYSIS  60 mcg Intravenous Q Sat-HD   diclofenac Sodium  4 g Topical QID   feeding supplement  237 mL Oral BID BM   fentaNYL       heparin  5,000 Units Subcutaneous Q12H   hydrALAZINE  50 mg Oral TID   isosorbide mononitrate  30 mg Oral Daily   lidocaine  1 patch Transdermal Q24H   multivitamin  1 tablet Oral QHS   mupirocin ointment  1 application Nasal BID   oxyCODONE       pantoprazole  40 mg Oral Daily   Continuous Infusions:  acetaminophen     [START ON 09/10/2020] ferric gluconate (FERRLECIT) IVPB       LOS: 3 days   Cherene Altes, MD [facility] Office  [phone] Pager - Text Page per Shea Evans  If 7PM-7AM, please contact night-coverage per Amion 09/09/2020, 4:47 PM

## 2020-09-09 NOTE — Progress Notes (Signed)
[facility] [place] PROGRESS NOTE  ASSESSMENT / PLAN: WOLFE CAMARENA is a 50 y.o. male with deteriorating renal function in need of permanent dialysis access. He is ambidextrous, but uses his right hand preferentially. He has suitable left basilic vein. I explained the need for two stages for basilic vein fistula creation. He his understanding. Will place [facility] and do first stage left basilic vein transposition today in OR.   SUBJECTIVE: No complaints. Explained operative plan in detail.  OBJECTIVE: BP (!) 143/65   Pulse 83   Temp 98.3 F (36.8 C) (Oral)   Resp 18   Ht 5\' 11"  (1.803 m)   Wt 68.9 kg   SpO2 96%   BMI 21.19 kg/m   Intake/Output Summary (Last 24 hours) at 09/09/2020 0944 Last data filed at 09/09/2020 0530 Gross per 24 hour  Intake 798 ml  Output 850 ml  Net -52 ml   R neck without significant scarring L arm 2+ radial and brachial pulse  CBC Latest Ref Rng & Units 09/08/2020 09/05/2020 09/02/2020  WBC 4.0 - 10.5 K/uL 3.9(L) 3.2(L) 4.6  Hemoglobin 13.0 - 17.0 g/dL 8.7(L) 9.0(L) 11.8(L)  Hematocrit 39.0 - 52.0 % 25.3(L) 27.0(L) 36.2(L)  Platelets 150 - 400 K/uL 68(L) 67(L) 78(L)     CMP Latest Ref Rng & Units 09/09/2020 09/08/2020 09/07/2020  Glucose 70 - 99 mg/dL 81 98 107(H)  BUN 6 - 20 mg/dL 68(H) 64(H) 63(H)  Creatinine 0.61 - 1.24 mg/dL 9.56(H) 9.85(H) 9.58(H)  Sodium 135 - 145 mmol/L 134(L) 136 136  Potassium 3.5 - 5.1 mmol/L 4.4 4.3 4.0  Chloride 98 - 111 mmol/L 103 106 106  CO2 22 - 32 mmol/L 21(L) 22 23  Calcium 8.9 - 10.3 mg/dL 9.0 8.9 8.7(L)  Total Protein 6.5 - 8.1 g/dL - - -  Total Bilirubin 0.3 - 1.2 mg/dL - - -  Alkaline Phos 38 - 126 U/L - - -  AST 15 - 41 U/L - - -  ALT 0 - 44 U/L - - -    Estimated Creatinine Clearance: 9.2 mL/min (A) (by C-G formula based on SCr of 9.56 mg/dL (H)).  [name]. Stanford Breed, MD [facility] Phone Number: [phone] 09/09/2020 9:44 AM

## 2020-09-09 NOTE — Progress Notes (Signed)
TRH night shift.  The patient is requesting something for constipation.  Dulcolax 10 mg p.o. x1 ordered.  He also wanted extra analgesics, but the patient is often somnolent and this would also worsen his constipation.  Tennis Must, MD.

## 2020-09-09 NOTE — Progress Notes (Signed)
Patient requested for Renal Navigator to follow up with him today when we initially met on Friday to discuss outpatient HD placement. He is determining whether he would like placement in the Transitional Care Unit (TCU) or a traditional 3x per week clinic. Navigator recommends TCU for new patient, especially since he is interested in learning about home therapy.  Navigator attempted to follow up today, but he is in the OR for TDU and permanent access. Navigator will follow up at a later time.   Alphonzo Cruise, [place] Renal Navigator [phone]

## 2020-09-09 NOTE — Progress Notes (Signed)
[facility] NEPHROLOGY PROGRESS NOTE  Assessment/ Plan: Pt is a 50 y.o. yo male with hypertension, CAD status post stent, AAA repair, stroke, biosynthetic AVR, CKD stage V followed by Dr. Joelyn Oms at CKD, admitted with chest pain now progressed to new ESRD.  #CKD stage V now progressed to new ESRD: Status post right IJ TDC and left first stage brachiobasilic AV fistula created by Dr. Stanford Breed on 09/09/2000.  He just came from PACU and is still under some anesthesia effect.  He remains NPO.  On room air and volume status acceptable.  Plan for first HD tomorrow morning.  Social worker is already following for arranging outpatient dialysis.  # Anemia of CKD: Iron saturation 40%, serum iron 109.  Start IV iron and ESA.  Monitor hemoglobin.  # Secondary hyperparathyroidism: Phosphorus level elevated which is expected to improve with dialysis.  Calcium at goal.  # HTN/volume: Blood pressure soft.  DC doxazosin and minoxidil.  Continue amlodipine, carvedilol, clonidine, hydralazine, Imdur.  May need to further titrate down the medication after initiation of dialysis.  Patient with history of refractory hypertension in the past.  #Recurrent chest pain: Continue cardiac medication.  Seen by cardiology.  Discussed with the primary team.  Subjective: Seen and examined at bedside.  Patient came back from the surgery after placement of tunneled catheter and creation of fistula.  He is still under anesthesia however alert awake and asking for pain medication.  Review of system limited. Objective Vital signs in last 24 hours: Vitals:   09/09/20 1245 09/09/20 1255 09/09/20 1315 09/09/20 1347  BP: (!) 96/56 (!) 95/54 (!) 94/55 (!) 97/58  Pulse: 71 70 71 69  Resp: 20 18 16 13   Temp:   97.7 F (36.5 C)   TempSrc:      SpO2: 98% 95% 95% 95%  Weight:      Height:       Weight change:   Intake/Output Summary (Last 24 hours) at 09/09/2020 1419 Last data filed at 09/09/2020 1132 Gross per 24  hour  Intake 1158 ml  Output 450 ml  Net 708 ml       Labs: Basic Metabolic Panel: Recent Labs  Lab 09/07/20 0206 09/08/20 0257 09/09/20 0258  NA 136 136 134*  K 4.0 4.3 4.4  CL 106 106 103  CO2 23 22 21*  GLUCOSE 107* 98 81  BUN 63* 64* 68*  CREATININE 9.58* 9.85* 9.56*  CALCIUM 8.7* 8.9 9.0  PHOS 5.3* 5.5* 6.2*   Liver Function Tests: Recent Labs  Lab 09/05/20 1150 09/07/20 0206 09/08/20 0257 09/09/20 0258  AST 19  --   --   --   ALT 14  --   --   --   ALKPHOS 59  --   --   --   BILITOT 0.7  --   --   --   PROT 5.5*  --   --   --   ALBUMIN 3.0* 2.9* 3.1* 3.1*   No results for input(s): LIPASE, AMYLASE in the last 168 hours. No results for input(s): AMMONIA in the last 168 hours. CBC: Recent Labs  Lab 09/05/20 1150 09/08/20 0257  WBC 3.2* 3.9*  HGB 9.0* 8.7*  HCT 27.0* 25.3*  MCV 91.8 90.7  PLT 67* 68*   Cardiac Enzymes: No results for input(s): CKTOTAL, CKMB, CKMBINDEX, TROPONINI in the last 168 hours. CBG: Recent Labs  Lab 09/05/20 1244  GLUCAP 124*    Iron Studies: No results for input(s): IRON, TIBC,  TRANSFERRIN, FERRITIN in the last 72 hours. Studies/Results: DG Chest Port 1 View  Result Date: 09/09/2020 CLINICAL DATA:  Dialysis catheter insertion EXAM: PORTABLE CHEST 1 VIEW COMPARISON:  09/02/2020 FINDINGS: Interval placement of right jugular central venous catheter tip in the right atrium. No pneumothorax. Aortic valve replacement. Aortic stent graft unchanged. Cardiac loop recorder unchanged. Interval development of diffuse bilateral airspace disease most likely edema. Mild bibasilar atelectasis IMPRESSION: Central line placement to the right atrium.  No pneumothorax Interval development of diffuse bilateral airspace disease likely pulmonary edema. Electronically Signed   By: Franchot Gallo M.D.   On: 09/09/2020 13:40   HYBRID OR IMAGING ([facility] ONLY)  Result Date: 09/09/2020 There is no interpretation for this exam.  This order is for images  obtained during a surgical procedure.  Please See "Surgeries" Tab for more information regarding the procedure.    Medications: Infusions:  acetaminophen      Scheduled Medications:  amLODipine  10 mg Oral Daily   arformoterol  15 mcg Nebulization BID   And   umeclidinium bromide  1 puff Inhalation Daily   aspirin  81 mg Oral Daily   atorvastatin  80 mg Oral Daily   carvedilol  25 mg Oral BID WC   cloNIDine  0.2 mg Transdermal Weekly   darbepoetin (ARANESP) injection - NON-DIALYSIS  40 mcg Subcutaneous Q Sat-1800   diclofenac Sodium  4 g Topical QID   doxazosin  16 mg Oral QHS   feeding supplement  237 mL Oral BID BM   fentaNYL       heparin  5,000 Units Subcutaneous Q12H   hydrALAZINE  50 mg Oral TID   isosorbide mononitrate  30 mg Oral Daily   lidocaine  1 patch Transdermal Q24H   minoxidil  20 mg Oral Daily   multivitamin  1 tablet Oral QHS   mupirocin ointment  1 application Nasal BID   oxyCODONE       pantoprazole  40 mg Oral Daily    have reviewed scheduled and prn medications.  Physical Exam: General:NAD, comfortable, confused due to anesthesia Heart:RRR, s1s2 nl Lungs:clear b/l, no crackle Abdomen:soft, Non-tender, non-distended Extremities:No LE edema Dialysis Access: Right IJ TDC and left AV fistula site clean without bleeding.  Foster Frericks Prasad Kaylor Simenson 09/09/2020,2:19 PM  LOS: 3 days

## 2020-09-09 NOTE — Anesthesia Procedure Notes (Signed)
Procedure Name: MAC Date/Time: 09/09/2020 9:57 AM Performed by: Fulton Reek, CRNA Pre-anesthesia Checklist: Patient identified, Emergency Drugs available, Suction available and Patient being monitored Patient Re-evaluated:Patient Re-evaluated prior to induction Oxygen Delivery Method: Simple face mask Dental Injury: Teeth and Oropharynx as per pre-operative assessment

## 2020-09-10 ENCOUNTER — Encounter (HOSPITAL_COMMUNITY): Payer: Self-pay | Admitting: Vascular Surgery

## 2020-09-10 LAB — CBC
HCT: 24.1 % — ABNORMAL LOW (ref 39.0–52.0)
HCT: 23.5 % — ABNORMAL LOW (ref 39.0–52.0)
Hemoglobin: 8.1 g/dL — ABNORMAL LOW (ref 13.0–17.0)
Hemoglobin: 8.1 g/dL — ABNORMAL LOW (ref 13.0–17.0)
MCH: 31 pg (ref 26.0–34.0)
MCH: 31 pg (ref 26.0–34.0)
MCHC: 33.6 g/dL (ref 30.0–36.0)
MCHC: 34.5 g/dL (ref 30.0–36.0)
MCV: 92.3 fL (ref 80.0–100.0)
MCV: 90 fL (ref 80.0–100.0)
Platelets: 75 10*3/uL — ABNORMAL LOW (ref 150–400)
Platelets: 72 10*3/uL — ABNORMAL LOW (ref 150–400)
RBC: 2.61 MIL/uL — ABNORMAL LOW (ref 4.22–5.81)
RBC: 2.61 MIL/uL — ABNORMAL LOW (ref 4.22–5.81)
RDW: 13.4 % (ref 11.5–15.5)
RDW: 13.3 % (ref 11.5–15.5)
WBC: 5 10*3/uL (ref 4.0–10.5)
WBC: 4.9 10*3/uL (ref 4.0–10.5)
nRBC: 0 % (ref 0.0–0.2)
nRBC: 0 % (ref 0.0–0.2)

## 2020-09-10 LAB — RENAL FUNCTION PANEL
Albumin: 3.2 g/dL — ABNORMAL LOW (ref 3.5–5.0)
Albumin: 3 g/dL — ABNORMAL LOW (ref 3.5–5.0)
Anion gap: 10 (ref 5–15)
Anion gap: 7 (ref 5–15)
BUN: 76 mg/dL — ABNORMAL HIGH (ref 6–20)
BUN: 49 mg/dL — ABNORMAL HIGH (ref 6–20)
CO2: 20 mmol/L — ABNORMAL LOW (ref 22–32)
CO2: 25 mmol/L (ref 22–32)
Calcium: 9 mg/dL (ref 8.9–10.3)
Calcium: 9.1 mg/dL (ref 8.9–10.3)
Chloride: 103 mmol/L (ref 98–111)
Chloride: 103 mmol/L (ref 98–111)
Creatinine, Ser: 9.39 mg/dL — ABNORMAL HIGH (ref 0.61–1.24)
Creatinine, Ser: 7.02 mg/dL — ABNORMAL HIGH (ref 0.61–1.24)
GFR, Estimated: 6 mL/min — ABNORMAL LOW (ref >60)
GFR, Estimated: 9 mL/min — ABNORMAL LOW (ref >60)
Glucose, Bld: 74 mg/dL (ref 70–99)
Glucose, Bld: 105 mg/dL — ABNORMAL HIGH (ref 70–99)
Phosphorus: 5.7 mg/dL — ABNORMAL HIGH (ref 2.5–4.6)
Phosphorus: 4.2 mg/dL (ref 2.5–4.6)
Potassium: 4.9 mmol/L (ref 3.5–5.1)
Potassium: 4.1 mmol/L (ref 3.5–5.1)
Sodium: 133 mmol/L — ABNORMAL LOW (ref 135–145)
Sodium: 135 mmol/L (ref 135–145)

## 2020-09-10 LAB — GLUCOSE, CAPILLARY: Glucose-Capillary: 145 mg/dL — ABNORMAL HIGH (ref 70–99)

## 2020-09-10 LAB — HEPATITIS B SURFACE ANTIGEN: Hepatitis B Surface Ag: NONREACTIVE

## 2020-09-10 LAB — KAPPA/LAMBDA LIGHT CHAINS
Kappa free light chain: 151.6 mg/L — ABNORMAL HIGH (ref 3.3–19.4)
Kappa, lambda light chain ratio: 2.13 — ABNORMAL HIGH (ref 0.26–1.65)
Lambda free light chains: 71.1 mg/L — ABNORMAL HIGH (ref 5.7–26.3)

## 2020-09-10 LAB — PROTEIN ELECTROPHORESIS, SERUM
A/G Ratio: 1.3 (ref 0.7–1.7)
Albumin ELP: 3.4 g/dL (ref 2.9–4.4)
Alpha-1-Globulin: 0.2 g/dL (ref 0.0–0.4)
Alpha-2-Globulin: 0.5 g/dL (ref 0.4–1.0)
Beta Globulin: 0.9 g/dL (ref 0.7–1.3)
Gamma Globulin: 1.1 g/dL (ref 0.4–1.8)
Globulin, Total: 2.6 g/dL (ref 2.2–3.9)
Total Protein ELP: 6 g/dL (ref 6.0–8.5)

## 2020-09-10 LAB — HEPATITIS B CORE ANTIBODY, TOTAL: Hep B Core Total Ab: NONREACTIVE

## 2020-09-10 MED ORDER — HEPARIN SODIUM (PORCINE) 1000 UNIT/ML DIALYSIS
20.0000 [IU]/kg | INTRAMUSCULAR | Status: DC | PRN
  Filled 2020-09-10: qty 2

## 2020-09-10 MED ORDER — ALTEPLASE 2 MG IJ SOLR
2.0000 mg | Freq: Once | INTRAMUSCULAR | Status: DC | PRN
  Filled 2020-09-10: qty 2

## 2020-09-10 MED ORDER — OXYCODONE HCL 5 MG PO TABS: 5.0000 mg | ORAL_TABLET | ORAL | Status: DC | PRN

## 2020-09-10 MED ORDER — ACETAMINOPHEN 325 MG PO TABS
650.0000 mg | ORAL_TABLET | Freq: Four times a day (QID) | ORAL | Status: DC
  Administered 2020-09-10 – 2020-09-11 (×2): 650 mg via ORAL
  Filled 2020-09-10 (×4): qty 2

## 2020-09-10 MED ORDER — PENTAFLUOROPROP-TETRAFLUOROETH EX AERO: 1.0000 "application " | INHALATION_SPRAY | CUTANEOUS | Status: DC | PRN

## 2020-09-10 MED ORDER — LIDOCAINE HCL (PF) 1 % IJ SOLN: 5.0000 mL | INTRAMUSCULAR | Status: DC | PRN

## 2020-09-10 MED ORDER — SODIUM CHLORIDE 0.9 % IV SOLN: 100.0000 mL | INTRAVENOUS | Status: DC | PRN

## 2020-09-10 MED ORDER — HEPARIN SODIUM (PORCINE) 1000 UNIT/ML IJ SOLN
INTRAMUSCULAR | Status: AC
  Administered 2020-09-10: 1000 [IU]
  Filled 2020-09-10: qty 4

## 2020-09-10 MED ORDER — HYDROMORPHONE HCL 1 MG/ML IJ SOLN: 0.5000 mg | INTRAMUSCULAR | Status: DC | PRN

## 2020-09-10 MED ORDER — HEPARIN SODIUM (PORCINE) 1000 UNIT/ML DIALYSIS
1000.0000 [IU] | INTRAMUSCULAR | Status: DC | PRN
  Filled 2020-09-10: qty 1

## 2020-09-10 MED ORDER — LIDOCAINE-PRILOCAINE 2.5-2.5 % EX CREA
1.0000 "application " | TOPICAL_CREAM | CUTANEOUS | Status: DC | PRN
  Filled 2020-09-10: qty 5

## 2020-09-10 MED ORDER — CHLORHEXIDINE GLUCONATE CLOTH 2 % EX PADS
6.0000 | MEDICATED_PAD | Freq: Every day | CUTANEOUS | Status: DC
  Administered 2020-09-10: 6 via TOPICAL

## 2020-09-10 NOTE — Progress Notes (Addendum)
Renal Navigator met with patient at bedside. He is much more alert than he was 2 hours ago and welcomes the visit. He states he has thought about his outpatient HD options and would like to be placed at the Transitional Care Unit at discharge. He understands this is 4x per week and is agreeable. He reports that he has his own transportation, and a good support system. He also has Medicaid and Navigator advised that he enroll in Medicaid Transportation now so that it is available to him should he need it in the future. He was very agreeable to this suggestion and says he will call tomorrow. Navigator texted him the phone number/[phone] and explained the process. He was very appreciative. Navigator completed referral to request outpatient HD seat at Transitional Care Unit (TCU) (awaiting HepB lab results to be complete) and will follow for discharge planning.   Shaw, Colleen Elizabeth, LCSW Renal Navigator [phone] 

## 2020-09-10 NOTE — Progress Notes (Signed)
Renal Navigator met with patient at HD bedside as report from RN this morning was that patient was very sleepy. Navigator observed the same at the end of HD tx. Patient could hardly open his eyes and was so soft spoken that Navigator could not understand what he was saying. HD RN at bedside. Navigator told patient that I will attempt to see him in an hour or so to see if he is feeling better and more able to communicate. He nodded yes.   Alphonzo Cruise, [place] Renal Navigator [phone]

## 2020-09-10 NOTE — Care Management Important Message (Signed)
Important Message  Patient Details  Name: [name] MRN: [id number] Date of Birth: Nov 27, 1970   Medicare Important Message Given:  Yes     Orbie Pyo 09/10/2020, 2:59 PM

## 2020-09-10 NOTE — Progress Notes (Addendum)
Progress Note  Patient Name: [name] Date of Encounter: 09/10/2020  [facility] Cardiologist: [name], MD   Subjective   Patient states he is tired, continue to have intermittent left sided chest pain radiating to his left arm. Palpation of left chest and arm reproduce aching and tenderness. He reports SOB with exertion, denied syncope. He states he is started on dialysis this admission. No longer drinking ETOH for 4-5 years, he is trying to quit smoking.    Inpatient Medications    Scheduled Meds:  acetaminophen  650 mg Oral Q6H   amLODipine  10 mg Oral Daily   arformoterol  15 mcg Nebulization BID   And   umeclidinium bromide  1 puff Inhalation Daily   aspirin  81 mg Oral Daily   atorvastatin  80 mg Oral Daily   carvedilol  25 mg Oral BID WC   Chlorhexidine Gluconate Cloth  6 each Topical Q0600   cloNIDine  0.2 mg Transdermal Weekly   [START ON 09/14/2020] darbepoetin (ARANESP) injection - DIALYSIS  60 mcg Intravenous Q Sat-HD   diclofenac Sodium  4 g Topical QID   feeding supplement  237 mL Oral BID BM   heparin  5,000 Units Subcutaneous Q12H   heparin sodium (porcine)       hydrALAZINE  50 mg Oral TID   isosorbide mononitrate  30 mg Oral Daily   lidocaine  1 patch Transdermal Q24H   multivitamin  1 tablet Oral QHS   mupirocin ointment  1 application Nasal BID   pantoprazole  40 mg Oral Daily   Continuous Infusions:  ferric gluconate (FERRLECIT) IVPB 250 mg (09/10/20 1030)   PRN Meds: albuterol, HYDROmorphone (DILAUDID) injection, nitroGLYCERIN, oxyCODONE, polyvinyl alcohol, prochlorperazine   Vital Signs    Vitals:   09/10/20 0912 09/10/20 0917 09/10/20 0930 09/10/20 1000  BP: 128/68 (!) 121/59 101/60 (!) 108/55  Pulse: 81 78 77 79  Resp:      Temp: 98.6 F (37 C)     TempSrc: Oral     SpO2: 95%     Weight: 75.1 kg     Height:        Intake/Output Summary (Last 24 hours) at 09/10/2020 1041 Last data filed at 09/09/2020 1500 Gross per 24  hour  Intake 1821.67 ml  Output --  Net 1821.67 ml   Last 3 Weights 09/10/2020 09/09/2020 09/02/2020  Weight (lbs) 165 lb 9.1 oz 151 lb 14.4 oz 152 lb  Weight (kg) 75.1 kg 68.9 kg 68.947 kg      Telemetry    N/A - Personally Reviewed  ECG    N/A - Personally Reviewed  Physical Exam   GEN: No acute distress.  Fatigued. Chronic ill appearing.  Neck: No JVD Cardiac: RRR, grade III systolic murmur of L/RSB ; mid-sternum surgical scar noted.  Respiratory: Clear to auscultation bilaterally. On room air.  GI: Soft, nontender, non-distended  MS: No BLE edema; No deformity. Neuro:  Nonfocal  Psych: Normal affect  Right chest temporary dialysis catheter  in use currently for HD, dressing is dry and intact   Labs    High Sensitivity Troponin:   Recent Labs  Lab 09/02/20 0118 09/02/20 0308 09/02/20 0840  TROPONINIHS 85* 79* 76*      Chemistry Recent Labs  Lab 09/05/20 1150 09/06/20 1009 09/08/20 0257 09/09/20 0258 09/10/20 0313  NA 136   < > 136 134* 133*  K 4.3   < > 4.3 4.4 4.9  CL 106   < >  106 103 103  CO2 23   < > 22 21* 20*  GLUCOSE 125*   < > 98 81 74  BUN 59*   < > 64* 68* 76*  CREATININE 8.56*   < > 9.85* 9.56* 9.39*  CALCIUM 8.8*   < > 8.9 9.0 9.0  PROT 5.5*  --   --   --   --   ALBUMIN 3.0*   < > 3.1* 3.1* 3.2*  AST 19  --   --   --   --   ALT 14  --   --   --   --   ALKPHOS 59  --   --   --   --   BILITOT 0.7  --   --   --   --   GFRNONAA 7*   < > 6* 6* 6*  ANIONGAP 7   < > '8 10 10   ' < > = values in this interval not displayed.     Hematology Recent Labs  Lab 09/05/20 1150 09/08/20 0257 09/10/20 0313  WBC 3.2* 3.9* 5.0  RBC 2.94* 2.79* 2.61*  HGB 9.0* 8.7* 8.1*  HCT 27.0* 25.3* 24.1*  MCV 91.8 90.7 92.3  MCH 30.6 31.2 31.0  MCHC 33.3 34.4 33.6  RDW 12.7 12.8 13.4  PLT 67* 68* 75*    BNPNo results for input(s): BNP, PROBNP in the last 168 hours.   DDimer No results for input(s): DDIMER in the last 168 hours.   Radiology    DG  Chest Port 1 View  Result Date: 09/09/2020 CLINICAL DATA:  Dialysis catheter insertion EXAM: PORTABLE CHEST 1 VIEW COMPARISON:  09/02/2020 FINDINGS: Interval placement of right jugular central venous catheter tip in the right atrium. No pneumothorax. Aortic valve replacement. Aortic stent graft unchanged. Cardiac loop recorder unchanged. Interval development of diffuse bilateral airspace disease most likely edema. Mild bibasilar atelectasis IMPRESSION: Central line placement to the right atrium.  No pneumothorax Interval development of diffuse bilateral airspace disease likely pulmonary edema. Electronically Signed   By: Franchot Gallo M.D.   On: 09/09/2020 13:40   HYBRID OR IMAGING ([facility] ONLY)  Result Date: 09/09/2020 There is no interpretation for this exam.  This order is for images obtained during a surgical procedure.  Please See "Surgeries" Tab for more information regarding the procedure.    Cardiac Studies   Echo from 09/03/20:   1. 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 S/P AVR with  elevated mean gradient of 33.5 mmHg (similar to gradient of 31 mmHg on  05/18/20 study).   2. Left ventricular ejection fraction, by estimation, is >75%. The left  ventricle has hyperdynamic function. The left ventricle has no regional  wall motion abnormalities. Left ventricular diastolic parameters are  consistent with Grade I diastolic  dysfunction (impaired relaxation). Elevated left atrial pressure.   3. Right ventricular systolic function is normal. The right ventricular  size is normal.   4. Left atrial size was moderately dilated.   5. Right atrial size was moderately dilated.   6. The mitral valve is normal in structure. Trivial mitral valve  regurgitation. No evidence of mitral stenosis.   7. The aortic valve has been repaired/replaced. Aortic valve  regurgitation is not visualized. Moderate aortic valve stenosis. There is  a bioprosthetic valve present in the aortic position.     Echo from 05/18/20:   1. Left ventricular ejection fraction, by estimation, is 60 to 65%. The  left ventricle has normal function. The  left ventricle has no regional  wall motion abnormalities. There is severe left ventricular hypertrophy.  Left ventricular diastolic parameters   are consistent with Grade II diastolic dysfunction (pseudonormalization).  Elevated left ventricular end-diastolic pressure. The average left  ventricular global longitudinal strain is -14.2 %. The global longitudinal  strain is abnormal.   2. Right ventricular systolic function is normal. The right ventricular  size is normal. Tricuspid regurgitation signal is inadequate for assessing  PA pressure.   3. Left atrial size was severely dilated.   4. Right atrial size was moderately dilated.   5. A small pericardial effusion is present. There is no evidence of  cardiac tamponade.   6. The mitral valve is normal in structure. Trivial mitral valve  regurgitation. No evidence of mitral stenosis.   7. The aortic valve has been repaired/replaced. Aortic valve  regurgitation is not visualized. Moderate aortic valve stenosis. There is  a bioprosthetic valve present in the aortic position. Procedure Date:  2016. Aortic valve mean gradient measures 31.7  mmHg.   8. Aortic root/ascending aorta has been repaired/replaced.   9. The inferior vena cava is normal in size with greater than 50%  respiratory variability, suggesting right atrial pressure of 3 mmHg.    Loop recorder device check on 02/04/20:  ILR summary report received. Battery status OK. Normal device function. 1 Pause event due to undersensing.  1 tachy events due to T wave oversensing.  No new symptom, brady, episodes. No new AF episodes.     Cardiac cath on 09/26/18:  Large, tortuous coronary arteries, mild to moderate disease involving the main branches.  Appearance is similar to prior catheterizations in 11/2017. Patent ostial ramus intermedius  stent.   Recommendations: Medical therapy and secondary prevention. Restart clopidogrel 75 mg daily in addition to aspirin 81 mg daily. Post-cath hydration with repeat BMP tomorrow to monitor renal function following contrast exposure today     Patient Profile     50 y.o. 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   Assessment & Plan    Chronic chest pain, atypical  CAD with history of PCI to proximal ramus 2019 -Per records, patient has longstanding history of nonexertional chest pain -Presented with persistent chest pain radiating to left arm; Pain is  reproducible on exam -High sensitive troponin 85 > 79> 76, in the setting of CKD progressing to ESRD this admission -EKG without acute ischemic change, LVH and repolarization abnormalities  - CRP 0.6 and ESR 6 WNL, less likely pericarditis/myocarditis -Echo from 6/21//22 with Myocardium with speckled appearance; consider amyloid; LVEF above 75%, LV hyperdynamic function, no RWMA, grade I DD, moderate dilatation of LA and RA, trivial MR, moderate aortic stenosis, bioprosthetic valve present in the aortic position -Cardiac cath on 09/26/2018 with large torturous coronary artery, mild to moderate disease involving the main branches, similar to cath in 11/2017, patent ostial ramus intermedius stent; he was recommended medical therapy for secondary prevention, continue aspirin and Plavix at the time -Pain is felt noncardiac in origin, no further work-up has been recommended -Continue medical therapy with aspirin 52m, Atorvastatin 868m Carvedilol 2578mID, and Imdur 30 mg currently , tolerating  above regimen  Cardiomyopathy of unclear etiology  Chronic diastolic heart failure, compensated  - query etiology of ETOH induced CM  -Previous EF 35 to 40% in 2013, normalized in 2016, reduced 35 to 40% in 2019, normalized again in 05/2020 -Echo from 09/03/2020 EF > 75%, grade 1 DD, LV hyperdynamic function; concern for speckled appearance, PYP scan in 2020 negative for amyloid.  Pending light chains/SPEP/UPEP and can plan repeat PYP as outpatient. - clinically euvolemic, on HD for volume management, not anuric, not on diuretics prior to admission  -GDMT was limited due to CKD progressing to ESRD, unable to add MRA/ARNI/SGLT2i, continued on coreg 53m BID and imdur 328mand hydralazine 5053mID  HTN - BP low normal now on amlodipine 38m50moreg 25mg58m, clonidine 0.2mg /33mys, hydralazine 50mg T33mImdur 30mg da62m consider discontinue amlodipine or clonidine if BP remains low to avoid hypotension   HLD - LDL 55 on 05/17/20, at goal - continued on statin at current dose and repatha outpatient   ESRD - newly progressed to ESRD this admission,started on HD per nephrology this admission  AAA s/p repair in 2016 [place] [facility] [place] Moderate AS - no acute issue, endorse chronic SOB with exertion, outpatient serial Echo for surveillance and follow up with [facility]    Hx of CVA /s,p loop recorder  Anemia of CKD  COPD - managed per primary team    Follow up appointment with cardiology/CHF clinic on 10/02/20    For questions or updates, please contact [facility] Please consult [email or web address] for contact info under     Signed, Xika ZhaMargie Billet28/2022, 10:41 AM    Patient seen, examined. Available data reviewed. Agree with findings, assessment, and plan as outlined by Xika ZhaMargie Billethe patient is independently interviewed and examined in the hemodialysis unit.  He is somnolent but answers questions appropriately.  He complains of chest discomfort.  Lungs are clear,  heart is regular rate and rhythm with a 2/6 systolic murmur heard throughout, abdomen soft nontender, extremities have no edema.  The patient has chronic chest pain with a flat, low-level troponin trend here in the hospital.  No plans for further ischemic evaluation at this time.  His chest pain is highly atypical and nearly constant, not suggestive of ischemic cardiac pain.  Plans as above for repeat outpatient PYP scan to assess for cardiac amyloid.  Protein electrophoresis studies pending.  No other inpatient cardiac issues identified at this time.  We will sign off today.  Please call if any questions.  [facility] will sign off.   Medication Recommendations:  none Other recommendations (labs, testing, etc):  Will arrange PYP scan Follow up as an outpatient:  will schedule   Melony Tenpas Sherren Mocha/28/2022 2:00 PM

## 2020-09-10 NOTE — Progress Notes (Signed)
[name]  LGX:[id number] DOB: 29-Apr-1970 DOA: 09/02/2020 PCP: Kerin Perna, NP    Brief Narrative:  50 year old with a history of CAD status post stenting 2019 and 2020, AAA repair 2016 for chronic dissection, CVA 2017, bioprosthetic AVR 2016, CKD stage IV, HTN, chronic systolic CHF with recovered EF, and recurrent chest pain who presented to the ED 6/20 with chest pain.  Significant Events:  Following admission the patient's renal function was noted to be worsening.  As result Nephrology was consulted and steps are being taken to initiate dialysis treatment.  Consultants:  None  Code Status: FULL CODE  Antimicrobials:  None  DVT prophylaxis: Subcutaneous heparin  Subjective: No acute issues or events overnight, evaluated at dialysis, states his has some mild neck pain but otherwise tolerating dialysis quite well.  Assessment & Plan:  CKD stage V - newly appreciated ESRD AV fistula and temporary dialysis catheter placed 6/27 HD today -tolerated well  Atypical chest pain Imdur initiated per cardiology  Coreg increased  Continue aspirin, statin   Anemia of CKD Care per nephrology  Bioprosthetic AVR and mitral valve Clinically stable with no acute findings on recent echocardiogram  Hypertension, essential. Poorly controlled Continue current regimen - HD to assist in volume management  HLD On Repatha as outpatient  History of CVA Has a loop recorder which is being managed by cardiology  Chronic thrombocytopenia Stable  COPD without acute exacerbation  GERD Continue PPI  Acute onset OD blurry vision CT head without new ischemic findings. Seen by ophthalmology, Dr. Stacie Glaze on 09/06/20, appreciate recs: artifical tears OD 2-4x/day PRN, can f/u PRN with regular eye doctor as outpatient and to get updated Mrx/glasses in addition to BP control.  Family Communication: Wife at bedside Status is: Inpatient  Remains inpatient appropriate because:Inpatient  level of care appropriate due to severity of illness  Dispo: The patient is from: Home              Anticipated d/c is to: Home              Patient currently is not medically stable to d/c.   Difficult to place patient No   Objective: Blood pressure 128/67, pulse 83, temperature 98.9 F (37.2 C), temperature source Oral, resp. rate 17, height 5\' 11"  (1.803 m), weight 68.9 kg, SpO2 94 %.  Intake/Output Summary (Last 24 hours) at 09/10/2020 0748 Last data filed at 09/09/2020 1500 Gross per 24 hour  Intake 1921.67 ml  Output --  Net 1921.67 ml    Filed Weights   09/02/20 0043 09/09/20 0840  Weight: 68.9 kg 68.9 kg    Examination: General: No acute respiratory distress Lungs: Clear to auscultation bilaterally without wheezes or crackles Cardiovascular: Regular rate and rhythm without murmur gallop or rub normal S1 and S2 Abdomen: Nontender, nondistended, soft, bowel sounds positive, no rebound, no ascites, no appreciable mass Extremities: No significant cyanosis, clubbing, or edema bilateral lower extremities  CBC: Recent Labs  Lab 09/05/20 1150 09/08/20 0257 09/10/20 0313  WBC 3.2* 3.9* 5.0  HGB 9.0* 8.7* 8.1*  HCT 27.0* 25.3* 24.1*  MCV 91.8 90.7 92.3  PLT 67* 68* 75*    Basic Metabolic Panel: Recent Labs  Lab 09/08/20 0257 09/09/20 0258 09/10/20 0313  NA 136 134* 133*  K 4.3 4.4 4.9  CL 106 103 103  CO2 22 21* 20*  GLUCOSE 98 81 74  BUN 64* 68* 76*  CREATININE 9.85* 9.56* 9.39*  CALCIUM 8.9 9.0 9.0  PHOS 5.5*  6.2* 5.7*    GFR: Estimated Creatinine Clearance: 9.4 mL/min (A) (by C-G formula based on SCr of 9.39 mg/dL (H)).  Liver Function Tests: Recent Labs  Lab 09/05/20 1150 09/07/20 0206 09/08/20 0257 09/09/20 0258 09/10/20 0313  AST 19  --   --   --   --   ALT 14  --   --   --   --   ALKPHOS 59  --   --   --   --   BILITOT 0.7  --   --   --   --   PROT 5.5*  --   --   --   --   ALBUMIN 3.0* 2.9* 3.1* 3.1* 3.2*      HbA1C: Hgb A1c MFr  Bld  Date/Time Value Ref Range Status  07/23/2020 11:10 AM 5.4 4.8 - 5.6 % Final    Comment:    (NOTE) Pre diabetes:          5.7%-6.4%  Diabetes:              >6.4%  Glycemic control for   <7.0% adults with diabetes   03/04/2016 05:12 AM 5.3 4.8 - 5.6 % Final    Comment:    (NOTE)         Pre-diabetes: 5.7 - 6.4         Diabetes: >6.4         Glycemic control for adults with diabetes: <7.0     CBG: Recent Labs  Lab 09/05/20 1244  GLUCAP 124*     Recent Results (from the past 240 hour(s))  Resp Panel by RT-PCR (Flu A&B, Covid) Nasopharyngeal Swab     Status: None   Collection Time: 09/02/20  7:33 PM   Specimen: Nasopharyngeal Swab; Nasopharyngeal(NP) swabs in vial transport medium  Result Value Ref Range Status   SARS Coronavirus 2 by RT PCR NEGATIVE NEGATIVE Final    Comment: (NOTE) SARS-CoV-2 target nucleic acids are NOT DETECTED.  The SARS-CoV-2 RNA is generally detectable in upper respiratory specimens during the acute phase of infection. The lowest concentration of SARS-CoV-2 viral copies this assay can detect is 138 copies/mL. A negative result does not preclude SARS-Cov-2 infection and should not be used as the sole basis for treatment or other patient management decisions. A negative result may occur with  improper specimen collection/handling, submission of specimen other than nasopharyngeal swab, presence of viral mutation(s) within the areas targeted by this assay, and inadequate number of viral copies(<138 copies/mL). A negative result must be combined with clinical observations, patient history, and epidemiological information. The expected result is Negative.  Fact Sheet for Patients:  EntrepreneurPulse.com.au  Fact Sheet for Healthcare Providers:  IncredibleEmployment.be  This test is no t yet approved or cleared by the [place] FDA and  has been authorized for detection and/or diagnosis of SARS-CoV-2  by FDA under an Emergency Use Authorization (EUA). This EUA will remain  in effect (meaning this test can be used) for the duration of the COVID-19 declaration under Section 564(b)(1) of the Act, 21 U.S.C.section 360bbb-3(b)(1), unless the authorization is terminated  or revoked sooner.       Influenza A by PCR NEGATIVE NEGATIVE Final   Influenza B by PCR NEGATIVE NEGATIVE Final    Comment: (NOTE) The Xpert Xpress SARS-CoV-2/FLU/RSV plus assay is intended as an aid in the diagnosis of influenza from Nasopharyngeal swab specimens and should not be used as a sole basis for treatment. Nasal washings and aspirates are  unacceptable for Xpert Xpress SARS-CoV-2/FLU/RSV testing.  Fact Sheet for Patients: EntrepreneurPulse.com.au  Fact Sheet for Healthcare Providers: IncredibleEmployment.be  This test is not yet approved or cleared by the [place] FDA and has been authorized for detection and/or diagnosis of SARS-CoV-2 by FDA under an Emergency Use Authorization (EUA). This EUA will remain in effect (meaning this test can be used) for the duration of the COVID-19 declaration under Section 564(b)(1) of the Act, 21 U.S.C. section 360bbb-3(b)(1), unless the authorization is terminated or revoked.  Performed at [facility], [place] [address] [place], [place] [address]   Surgical PCR screen     Status: None   Collection Time: 09/09/20  5:49 AM   Specimen: Nasal Mucosa; Nasal Swab  Result Value Ref Range Status   MRSA, PCR NEGATIVE NEGATIVE Final   Staphylococcus aureus NEGATIVE NEGATIVE Final    Comment: (NOTE) The Xpert SA Assay (FDA approved for NASAL specimens in patients 71 years of age and older), is one component of a comprehensive surveillance program. It is not intended to diagnose infection nor to guide or monitor treatment. Performed at [facility], [place] [address] [place], [place] [address]       Scheduled Meds:   amLODipine  10 mg Oral Daily   arformoterol  15 mcg Nebulization BID   And   umeclidinium bromide  1 puff Inhalation Daily   aspirin  81 mg Oral Daily   atorvastatin  80 mg Oral Daily   carvedilol  25 mg Oral BID WC   Chlorhexidine Gluconate Cloth  6 each Topical Daily   cloNIDine  0.2 mg Transdermal Weekly   [START ON 09/14/2020] darbepoetin (ARANESP) injection - DIALYSIS  60 mcg Intravenous Q Sat-HD   diclofenac Sodium  4 g Topical QID   feeding supplement  237 mL Oral BID BM   heparin  5,000 Units Subcutaneous Q12H   hydrALAZINE  50 mg Oral TID   isosorbide mononitrate  30 mg Oral Daily   lidocaine  1 patch Transdermal Q24H   multivitamin  1 tablet Oral QHS   mupirocin ointment  1 application Nasal BID   pantoprazole  40 mg Oral Daily   Continuous Infusions:  ferric gluconate (FERRLECIT) IVPB       LOS: 4 days   Holli Humbles DO Pager - Secure Chat  If 7PM-7AM, please contact night-coverage per Amion 09/10/2020, 7:48 AM

## 2020-09-10 NOTE — Progress Notes (Signed)
Pt completed HD tx after that c/o: I feels chest tight. Nitroglycerin 0.4 mg SL given. O2 2L sat 97%. Dr. Carolin Sicks notified and no order received. Pt BP 127/63 HR 86. Report given to [facility]. Sarah Therapist, sports. Pt sent to room in stable condition.

## 2020-09-10 NOTE — Progress Notes (Addendum)
[facility]  Subjective  - Left arm incisiion pain.   Objective 128/67 83 98.9 F (37.2 C) (Oral) 17 94%  Intake/Output Summary (Last 24 hours) at 09/10/2020 0728 Last data filed at 09/09/2020 1500 Gross per 24 hour  Intake 1921.67 ml  Output --  Net 1921.67 ml    Mild forearm edema, weakness secondary to pain with grip, palpable radial pulse.  Palpable thrill near anastomosis of fistula. TDC right IJ dressing clean and dry Lungs non labored breathing   Assessment/Planning: POD # left basilic vein fistula creation first stage Right IJ TDC by Dr. Stanford Breed 09/09/20  He has abnormally heightened pain in the left UE.  He has good arterial flow to the hand with a palpable radial pulse.  Hopefully with time his pain will improve.  His incisional pain does not appear to be frank steal.   We will see him again tomorrow.  We will schedule a f/u in 4-5 weeks with a fistula duplex and plan second stage transposition of the basilic vein fistula.  Do not use fistula for 12 weeks.  Roxy Horseman 09/10/2020 7:28 AM --  Laboratory Lab Results: Recent Labs    09/08/20 0257 09/10/20 0313  WBC 3.9* 5.0  HGB 8.7* 8.1*  HCT 25.3* 24.1*  PLT 68* 75*   BMET Recent Labs    09/09/20 0258 09/10/20 0313  NA 134* 133*  K 4.4 4.9  CL 103 103  CO2 21* 20*  GLUCOSE 81 74  BUN 68* 76*  CREATININE 9.56* 9.39*  CALCIUM 9.0 9.0    COAG Lab Results  Component Value Date   INR 1.1 05/17/2020   INR 1.09 01/31/2017   INR 1.07 03/04/2016   No results found for: PTT  VASCULAR STAFF ADDENDUM: I have independently interviewed and examined the patient. I agree with the above.  More incisional pain than expected No evidence of steal syndrome.  Added scheduled acetaminophen; increased PRN narcotics. OK to use catheter at any time. Fistula will need transposition in 4-6 weeks.  [name]. Stanford Breed, MD [facility] Phone Number: [phone] 09/10/2020 8:14 AM

## 2020-09-10 NOTE — Procedures (Signed)
Patient was seen on dialysis and the procedure was supervised.  BFR 250  Via TDC BP is  108/55.   Patient appears to be tolerating treatment well. Plan for next HD tomorrow. SW following for CLIP.   [name] Tanna Furry 09/10/2020

## 2020-09-11 ENCOUNTER — Encounter (HOSPITAL_COMMUNITY): Payer: Self-pay | Admitting: Vascular Surgery

## 2020-09-11 ENCOUNTER — Other Ambulatory Visit (HOSPITAL_COMMUNITY): Payer: Self-pay

## 2020-09-11 LAB — RENAL FUNCTION PANEL
Albumin: 2.9 g/dL — ABNORMAL LOW (ref 3.5–5.0)
Anion gap: 8 (ref 5–15)
BUN: 57 mg/dL — ABNORMAL HIGH (ref 6–20)
CO2: 24 mmol/L (ref 22–32)
Calcium: 8.8 mg/dL — ABNORMAL LOW (ref 8.9–10.3)
Chloride: 101 mmol/L (ref 98–111)
Creatinine, Ser: 7.68 mg/dL — ABNORMAL HIGH (ref 0.61–1.24)
GFR, Estimated: 8 mL/min — ABNORMAL LOW (ref >60)
Glucose, Bld: 114 mg/dL — ABNORMAL HIGH (ref 70–99)
Phosphorus: 4.6 mg/dL (ref 2.5–4.6)
Potassium: 4.1 mmol/L (ref 3.5–5.1)
Sodium: 133 mmol/L — ABNORMAL LOW (ref 135–145)

## 2020-09-11 LAB — IMMUNOFIXATION, URINE

## 2020-09-11 LAB — HEPATITIS B SURFACE ANTIBODY, QUANTITATIVE: Hep B S AB Quant (Post): <3.1 m[IU]/mL — ABNORMAL LOW (ref >9.9)

## 2020-09-11 MED ORDER — CLONIDINE 0.2 MG/24HR TD PTWK
0.2000 mg | MEDICATED_PATCH | TRANSDERMAL | 0 refills | Status: DC
  Filled 2020-09-11: qty 4, 28d supply, fill #0

## 2020-09-11 MED ORDER — HYDRALAZINE HCL 50 MG PO TABS
50.0000 mg | ORAL_TABLET | Freq: Three times a day (TID) | ORAL | 0 refills | Status: DC
  Filled 2020-09-11: qty 90, 30d supply, fill #0

## 2020-09-11 MED ORDER — ISOSORBIDE MONONITRATE ER 30 MG PO TB24
30.0000 mg | ORAL_TABLET | Freq: Every day | ORAL | 0 refills | Status: DC
  Filled 2020-09-11: qty 30, 30d supply, fill #0

## 2020-09-11 MED ORDER — HEPARIN SODIUM (PORCINE) 1000 UNIT/ML IJ SOLN
INTRAMUSCULAR | Status: AC
  Administered 2020-09-11: 1000 [IU]
  Filled 2020-09-11: qty 4

## 2020-09-11 MED ORDER — CHLORHEXIDINE GLUCONATE CLOTH 2 % EX PADS: 6.0000 | MEDICATED_PAD | Freq: Every day | CUTANEOUS | Status: DC

## 2020-09-11 NOTE — Progress Notes (Signed)
Renal Navigator faxed HepB lab results to [facility] to complete referral and will monitor closely for acceptance and seat schedule.   Alphonzo Cruise, [place] Renal Navigator [phone]

## 2020-09-11 NOTE — Progress Notes (Signed)
Patient has been accepted at the Transitional Care Unit (TCU) at the [facility] HD clinic with a 9:40am seat time, M/T/Th/S. He needs to arrive at 9:20am to his appointments. Navigator has been informed that patient is medically ready to discharge today by Attending and Nephrologist. Per discussion with Dr. Carolin Sicks and TCU Manager, we will plan for his first outpt HD tx to be this Friday, 09/13/20. He needs to arrive at 9:00am on his first day in order to sign intake paperwork prior to his first appointment. Renal NP/C. Anderson to send outpt orders to clinic.  Navigator met with patient and his wife at bedside to discuss the above verbally and provide letter in writing. They are appreciative and understanding. She states she will drive him to/from HD until he is feeling like himself again. Patient tells Navigator that he is feeling very weak today. Navigator asks if he has been out of bed. He says "a little." Wife reports that she helped him walk with a walker in the room. They ask if they can get a walker to take home. Navigator asked Attending about this and the possibility of HHPT, with patient is agreeable to. He agreed and ordered. TOC CM aware. Patient and wife are in no rush to leave and stated that patient would like to take a rest before going home today.   Alphonzo Cruise, [place] Renal Navigator [phone]

## 2020-09-11 NOTE — Progress Notes (Addendum)
Patient complains of shortness of breath.  Breathing treatment administered. Patient states that after the treatment he feels better. Will continue to monitor.   Donah Driver, RN

## 2020-09-11 NOTE — TOC Initial Note (Signed)
Transition of Care [facility]) - Initial/Assessment Note    Patient Details  Name: [name] MRN: [id number] Date of Birth: [birth year]  Transition of [facility]) CM/SW Contact:    Bethena Roys, RN Phone Number: 09/11/2020, 2:59 PM  Clinical Narrative:  Risk for readmission assessment completed. Prior to arrival patient was from home with spouse. Plan to return home with home health PT. Patient did not have a preference for home health services. Patient is agreeable to home health with [facility]. Referral made and start of care to begin within 24-48 hours post transition home. Patient in need of a rolling walker- ordered via [employer] and it was delivered to the room. Wife to provide transportation home. No further needs from Case Manager at this time.           Expected Discharge Plan: [place] Barriers to Discharge: No Barriers Identified   Patient Goals and CMS Choice Patient states their goals for this hospitalization and ongoing recovery are:: to return home with home health Physical Therapy   Choice offered to / list presented to : NA  Expected Discharge Plan and Services Expected Discharge Plan: [place]   Discharge Planning Services: CM Consult Post Acute Care Choice: [place] arrangements for the past 2 months: Single Family Home Expected Discharge Date: 09/11/20               DME Arranged: Gilford Rile rolling DME Agency: [employer] Date DME Agency Contacted: 09/11/20 Time DME Agency Contacted: 1430 Representative spoke with at DME Agency: [place] Arranged: PT [place]: [place] Date [place]: 09/11/20 Time [place]: [place] Representative spoke with at [place]: Tommi Rumps  Prior Living Arrangements/Services Living arrangements for the past 2 months: [place] with:: Self, Spouse Patient language and need for interpreter reviewed:: Yes Do you feel safe going back to the  place where you live?: Yes      Need for Family Participation in Patient Care: Yes (Comment) Care giver support system in place?: Yes (comment)   Criminal Activity/Legal Involvement Pertinent to Current Situation/Hospitalization: No - Comment as needed  Activities of Daily Living Home Assistive Devices/Equipment: None ADL Screening (condition at time of admission) Patient's cognitive ability adequate to safely complete daily activities?: Yes Is the patient deaf or have difficulty hearing?: No Does the patient have difficulty seeing, even when wearing glasses/contacts?: Yes (wears glasses but they are currently broken, working on getting new ones) Does the patient have difficulty concentrating, remembering, or making decisions?: Yes (difficutly remembering at times. 2 strokes in the past) Patient able to express need for assistance with ADLs?: No Does the patient have difficulty dressing or bathing?: No Independently performs ADLs?: Yes (appropriate for developmental age) Does the patient have difficulty walking or climbing stairs?: No Weakness of Legs: None Weakness of Arms/Hands: None  Permission Sought/Granted Permission sought to share information with : Case Manager, Family Supports, Chartered certified accountant granted to share information with : Yes, Verbal Permission Granted     Permission granted to share info w AGENCY: [place], [employer]        Emotional Assessment Appearance:: Appears stated age Attitude/Demeanor/Rapport: Engaged Affect (typically observed): Appropriate Orientation: : Oriented to Situation, Oriented to  Time, Oriented to Place, Oriented to Self Alcohol / Substance Use: Not Applicable Psych Involvement: No (comment)  Admission diagnosis:  Precordial pain [R07.2] ESRD (end stage renal disease) ([place]) [N18.6] Chest pain [R07.9] ESRD needing  dialysis [facility]) [N18.6, Z99.2] Patient Active Problem List   Diagnosis Date Noted   Malnutrition of  moderate degree 09/06/2020   ESRD needing dialysis ([place]) 09/06/2020   Cryptogenic stroke ([place]) 05/16/2020   History of loop recorder 02/13/2020   Chest pain 11/24/2019   COPD (chronic obstructive pulmonary disease) ([place]) 11/24/2019   Nonspecific chest pain    Chest pain, rule out acute myocardial infarction 02/63/7858   Chronic systolic (congestive) heart failure ([place]) 04/25/2018   Acute on chronic combined systolic and diastolic CHF (congestive heart failure) ([place]) 12/08/2017   Coronary artery disease involving native coronary artery of native heart with angina pectoris ([place]) 12/06/2017   NSTEMI (non-ST elevated myocardial infarction) ([place]) 12/02/2017   Hypertensive emergency - Accelerated Hypertension 12/02/2017   History of stroke 10/05/2017   Somnolence, daytime 04/07/2017   Snoring 04/07/2017   Cerebral infarction due to embolism of left middle cerebral artery ([place]) 06/03/2016   History of aortic dissection 06/03/2016   Palpitation    Cerebrovascular accident (CVA) due to embolism of left middle cerebral artery ([place])    Seizures ([place]) 02/28/2016   HLD (hyperlipidemia) 08/28/2015   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   Cardiomyopathy ([place]) 03/01/2015   Acid reflux 03/01/2015   Essential hypertension 03/01/2015   S/P aortic dissection repair 10/03/2014   H/O aortic valve replacement 10/03/2014   CKD (chronic kidney disease), stage IV ([place]) 05/15/2014   AKI (acute kidney injury) ([place])    Dissecting aneurysm of thoracic aorta, 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 06/10/2011   Chronic bronchitis ([place]) 05/23/2011   Cardiomegaly - hypertensive 05/22/2011   Marijuana use 05/22/2011   Abdominal pain 05/22/2011   Hypertension, malignant 05/22/2011   Anxiety and depression 05/22/2011   Hyperlipidemia LDL goal <70 08/26/2009   Tobacco use 08/26/2009   Headache(784.0) 08/26/2009   Aortic valve disorder 03/11/2009   INGUINAL HERNIA 02/18/2009   Uncontrolled hypertension 01/16/2009   PCP:  Kerin Perna, NP Pharmacy:   [facility] [place] [address] [place] [address] Phone: [phone] Fax: [phone]  [facility] [address] [address] Phone: [phone] Fax: [phone]   Readmission Risk Interventions Readmission Risk Prevention Plan 09/11/2020 09/28/2018  Transportation Screening Complete Complete  PCP or Specialist Appt within 3-5 Days - Complete  HRI or [place] - Complete  Social Work Consult for [place] Planning/Counseling - Complete  Palliative Care Screening - Not Applicable  Medication Review Press photographer) Complete Complete  PCP or Specialist appointment within 3-5 days of discharge Complete -  [place] or Home Care Consult Complete -  SW Recovery Care/Counseling Consult Complete -  Palliative Care Screening Not Applicable -  [place] Not Applicable -  Some recent data might be hidden

## 2020-09-11 NOTE — Progress Notes (Addendum)
Patient complains of shortness of breath, breathing treatment administered. Patient states that he feels a little better after the breathing treatment. Will continue to monitor.   Donah Driver, RN

## 2020-09-11 NOTE — Discharge Summary (Signed)
Physician Discharge Summary  [name] QQP:[id number] DOB: Sep 26, 1970 DOA: 09/02/2020  PCP: Kerin Perna, NP  Admit date: 09/02/2020 Discharge date: 09/11/2020  Admitted From: Home Disposition:  Home  Recommendations for Outpatient Follow-up:  Follow up with PCP in 1-2 weeks Follow up with scheduled HD  Discharge Condition:Stable CODE STATUS:Full Diet recommendation: Renal   Brief/Interim Summary: 50 year old with a history of CAD status post stenting 2019 and 2020, AAA repair 2016 for chronic dissection, CVA 2017, bioprosthetic AVR 2016, CKD stage IV, HTN, chronic systolic CHF with recovered EF, and recurrent chest pain who presented to the ED 6/20 with chest pain.  Discharge Diagnoses:  Active Problems:   Chest pain, rule out acute myocardial infarction   Chest pain   Malnutrition of moderate degree   ESRD needing dialysis ([place])  CKD stage V - newly appreciated ESRD AV fistula and temporary dialysis catheter placed 6/27 Tolerated HD in hospital Outpt HD has since been arranged by social work   Atypical chest pain Imdur initiated per cardiology Coreg was titrated Continue aspirin, statin   Anemia of CKD Care per nephrology   Bioprosthetic AVR and mitral valve Clinically stable with no acute findings on recent echocardiogram   Hypertension, essential. Poorly controlled Continue current regimen - HD to assist in volume management   HLD On Repatha as outpatient   History of CVA Has a loop recorder which is being managed by cardiology   Chronic thrombocytopenia Stable   COPD without acute exacerbation   GERD Continue PPI   Acute onset OD blurry vision CT head without new ischemic findings. Seen by ophthalmology, Dr. Stacie Glaze on 09/06/20, appreciate recs: artifical tears OD 2-4x/day PRN, can f/u PRN with regular eye doctor as outpatient and to get updated Mrx/glasses in addition to BP control.    Discharge Instructions   Allergies as of 09/11/2020        Reactions   Imdur [isosorbide Nitrate] Other (See Comments)   Headaches   Tramadol Other (See Comments)   Headaches        Medication List     STOP taking these medications    amLODipine 10 MG tablet Commonly known as: NORVASC   cloNIDine 0.3 mg/24hr patch Commonly known as: CATAPRES - Dosed in mg/24 hr Replaced by: cloNIDine 0.2 mg/24hr patch   doxazosin 8 MG tablet Commonly known as: CARDURA   MINOXIDIL PO       TAKE these medications    acetaminophen 325 MG tablet Commonly known as: TYLENOL Take 650 mg by mouth every 6 (six) hours as needed for moderate pain (for pain or headaches).   albuterol 108 (90 Base) MCG/ACT inhaler Commonly known as: VENTOLIN HFA Inhale 2 puffs into the lungs every 6 (six) hours as needed for wheezing or shortness of breath.   aspirin 81 MG EC tablet Take 1 tablet (81 mg total) by mouth daily.   atorvastatin 80 MG tablet Commonly known as: LIPITOR Take 1 tablet (80 mg total) by mouth daily.   Blood Pressure Monitor Kit 1 Device by Does not apply route 2 (two) times daily.   carvedilol 25 MG tablet Commonly known as: COREG Take 25 mg by mouth 2 (two) times daily with a meal.   cloNIDine 0.2 mg/24hr patch Commonly known as: CATAPRES - Dosed in mg/24 hr Place 1 patch (0.2 mg total) onto the skin once a week. Start taking on: September 15, 2020 Replaces: cloNIDine 0.3 mg/24hr patch   hydrALAZINE 50 MG tablet Commonly known as: APRESOLINE  Take 1 tablet (50 mg total) by mouth 3 (three) times daily. What changed: how much to take   isosorbide mononitrate 30 MG 24 hr tablet Commonly known as: IMDUR Take 1 tablet (30 mg total) by mouth daily. Start taking on: September 12, 2020   lidocaine 5 % Commonly known as: LIDODERM PLACE 1 PATCH ONTO THE SKIN EVERY 12 HOURS REMOVE AND DISCARD PATCH WITHIN 12 HOURS OR AS DIRECTED BY MD What changed: See the new instructions.   pantoprazole 40 MG tablet Commonly known as: PROTONIX Take 1  tablet (40 mg total) by mouth daily.   Repatha SureClick 497 MG/ML [place] Generic drug: Evolocumab Inject 1 Dose into the skin every 14 (fourteen) days.   Stiolto Respimat 2.5-2.5 MCG/ACT Aers Generic drug: Tiotropium Bromide-Olodaterol INHALE 2 PUFFS INTO THE LUNGS DAILY        Follow-up Information     Cherre Robins, MD Follow up in 5 week(s).   Specialties: Vascular Surgery, Interventional Cardiology Why: Office will call you to arrange your appt (sent) Contact information: [place] [address] [phone]         Kerin Perna, NP. Schedule an appointment as soon as possible for a visit in 1 week(s).   Specialty: Internal Medicine Why: Hospital follow up Contact information: [place] [address] [phone]         Larey Dresser, MD .   Specialty: Cardiology Contact information: [phone] [address] [address] [phone]         Larey Dresser, MD .   Specialty: Cardiology Contact information: [phone] [address] [place] [address] [phone]                Allergies  Allergen Reactions   Imdur [Isosorbide Nitrate] Other (See Comments)    Headaches    Tramadol Other (See Comments)    Headaches     Consultations: Cardiology Nephrology  Procedures/Studies: DG Chest 2 View  Result Date: 09/02/2020 CLINICAL DATA:  Chest pain EXAM: CHEST - 2 VIEW COMPARISON:  05/17/2020 FINDINGS: Status post median sternotomy. Previous stent graft repair of thoracic aorta. Loop recorder is noted along the left heart border. Heart size normal. Parenchymal scarring scratch set pleural and parenchymal scarring within the left mid and left lower lung noted. No airspace consolidation identified. IMPRESSION: No acute cardiopulmonary abnormalities. Electronically Signed   By: Kerby Moors M.D.   On: 09/02/2020 02:25   CT HEAD WO CONTRAST  Result Date:  09/05/2020 CLINICAL DATA:  Neuro deficit, acute, stroke suspected acute blurry vision in right eye, hx CVA, no other deficits EXAM: CT HEAD WITHOUT CONTRAST TECHNIQUE: Contiguous axial images were obtained from the base of the skull through the vertex without intravenous contrast. COMPARISON:  Head CT 12/02/2017 FINDINGS: Brain: Remote left parietal infarct is unchanged from prior imaging. No evidence of acute ischemia. Normal brain volume with minor periventricular chronic small vessel ischemia. No intracranial hemorrhage, mass effect, or midline shift. No hydrocephalus. The basilar cisterns are patent. No extra-axial or intracranial fluid collection. Vascular: No hyperdense vessel. Skull: No fracture or focal lesion. Sinuses/Orbits: Mucosal thickening throughout the ethmoid air cells and left frontal sinus. Mucous retention cysts in the maxillary sinuses. Remote right orbital fracture. No acute orbital findings Other: None. IMPRESSION: 1. No acute intracranial abnormality. 2. Remote left parietal infarct. 3. Remote right orbital wall fracture. Electronically Signed   By: Keith Rake M.D.   On: 09/05/2020 18:53  DG Chest Port 1 View  Result Date: 09/09/2020 CLINICAL DATA:  Dialysis catheter insertion EXAM: PORTABLE CHEST 1 VIEW COMPARISON:  09/02/2020 FINDINGS: Interval placement of right jugular central venous catheter tip in the right atrium. No pneumothorax. Aortic valve replacement. Aortic stent graft unchanged. Cardiac loop recorder unchanged. Interval development of diffuse bilateral airspace disease most likely edema. Mild bibasilar atelectasis IMPRESSION: Central line placement to the right atrium.  No pneumothorax Interval development of diffuse bilateral airspace disease likely pulmonary edema. Electronically Signed   By: Franchot Gallo M.D.   On: 09/09/2020 13:40   ECHOCARDIOGRAM COMPLETE  Result Date: 09/03/2020    ECHOCARDIOGRAM REPORT   Patient Name:   [name] Date of Exam:  09/03/2020 Medical Rec #:  [id number]     Height:       71.0 in Accession #:    [id number]    Weight:       152.0 lb Date of Birth:  1970/05/17      BSA:          1.876 m Patient Age:    76 years      BP:           146/73 mmHg Patient Gender: M             HR:           94 bpm. Exam Location:  Inpatient Procedure: Cardiac Doppler and Color Doppler Indications:    R07.9* Chest pain, unspecified  History:        Patient has prior history of Echocardiogram examinations, most                 recent 05/18/2020. Descending thoraciic disection repaired; AVR                 2006. Patient states he has had chest pain for six years since                 his surgery but this is different.                 Aortic Valve: bioprosthetic valve is present in the aortic                 position.  Sonographer:    Merrie Roof RDCS Referring Phys: [id number] [place]  1. 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 S/P AVR with elevated mean gradient of 33.5 mmHg (similar to gradient of 31 mmHg on 05/18/20 study).  2. Left ventricular ejection fraction, by estimation, is >75%. The left ventricle has hyperdynamic function. The left ventricle has no regional wall motion abnormalities. Left ventricular diastolic parameters are consistent with Grade I diastolic dysfunction (impaired relaxation). Elevated left atrial pressure.  3. Right ventricular systolic function is normal. The right ventricular size is normal.  4. Left atrial size was moderately dilated.  5. Right atrial size was moderately dilated.  6. The mitral valve is normal in structure. Trivial mitral valve regurgitation. No evidence of mitral stenosis.  7. The aortic valve has been repaired/replaced. Aortic valve regurgitation is not visualized. Moderate aortic valve stenosis. There is a bioprosthetic valve present in the aortic position. FINDINGS  Left Ventricle: Left ventricular ejection fraction, by estimation, is >75%. The left ventricle has hyperdynamic  function. The left ventricle has no regional wall motion abnormalities. The left ventricular internal cavity size was normal in size. There is no left ventricular hypertrophy. Left ventricular diastolic parameters are consistent with Grade I diastolic dysfunction (  impaired relaxation). Elevated left atrial pressure. Right Ventricle: The right ventricular size is normal. Right ventricular systolic function is normal. Left Atrium: Left atrial size was moderately dilated. Right Atrium: Right atrial size was moderately dilated. Pericardium: There is no evidence of pericardial effusion. Mitral Valve: The mitral valve is normal in structure. Mild mitral annular calcification. Trivial mitral valve regurgitation. No evidence of mitral valve stenosis. Tricuspid Valve: The tricuspid valve is normal in structure. Tricuspid valve regurgitation is trivial. No evidence of tricuspid stenosis. Aortic Valve: The aortic valve has been repaired/replaced. Aortic valve regurgitation is not visualized. Moderate aortic stenosis is present. Aortic valve mean gradient measures 33.5 mmHg. Aortic valve peak gradient measures 55.2 mmHg. Aortic valve area,  by VTI measures 1.30 cm. There is a bioprosthetic valve present in the aortic position. Pulmonic Valve: The pulmonic valve was normal in structure. Pulmonic valve regurgitation is trivial. No evidence of pulmonic stenosis. Aorta: The aortic root is normal in size and structure. Venous: The inferior vena cava was not well visualized. IAS/Shunts: No atrial level shunt detected by color flow Doppler. 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 S/P AVR with elevated mean gradient of 33.5 mmHg (similar to gradient of 31 mmHg on 05/18/20 study).  LEFT VENTRICLE PLAX 2D LVIDd:         4.30 cm  Diastology LVIDs:         2.70 cm  LV e' medial:    3.59 cm/s LV PW:         1.90 cm  LV E/e' medial:  25.4 LV IVS:        1.80 cm  LV e' lateral:   6.09 cm/s LVOT diam:      2.10 cm  LV E/e' lateral: 15.0 LV SV:         79 LV SV Index:   42 LVOT Area:     3.46 cm  RIGHT VENTRICLE RV Basal diam:  4.00 cm LEFT ATRIUM             Index       RIGHT ATRIUM           Index LA diam:        4.50 cm 2.40 cm/m  RA Area:     22.50 cm LA Vol (A2C):   83.1 ml 44.29 ml/m RA Volume:   73.30 ml  39.06 ml/m LA Vol (A4C):   60.3 ml 32.14 ml/m LA Biplane Vol: 75.4 ml 40.18 ml/m  AORTIC VALVE AV Area (Vmax):    1.19 cm AV Area (Vmean):   1.16 cm AV Area (VTI):     1.30 cm AV Vmax:           371.50 cm/s AV Vmean:          273.500 cm/s AV VTI:            0.610 m AV Peak Grad:      55.2 mmHg AV Mean Grad:      33.5 mmHg LVOT Vmax:         128.00 cm/s LVOT Vmean:        91.700 cm/s LVOT VTI:          0.229 m LVOT/AV VTI ratio: 0.38  AORTA Ao Root diam: 3.70 cm Ao Asc diam:  3.70 cm MITRAL VALVE MV Area (PHT): 4.21 cm     SHUNTS MV Decel Time: 180 msec     Systemic VTI:  0.23 m MV E velocity: 91.20 cm/s  Systemic Diam: 2.10 cm MV A velocity: 133.00 cm/s MV E/A ratio:  0.69 Kirk Ruths MD Electronically signed by Kirk Ruths MD Signature Date/Time: 09/03/2020/12:15:17 PM    Final    VAS [name] UPPER EXT VEIN MAPPING (PRE-OP AVF)  Result Date: 09/06/2020 UPPER EXTREMITY VEIN MAPPING Patient Name:  [name]  Date of Exam:   09/06/2020 Medical Rec #: [id number]      Accession #:    [id number] Date of Birth: [birth year]       Patient Gender: M Patient Age:   20Y Exam Location:  [facility] Procedure:      VAS [name] UPPER EXT VEIN MAPPING (PRE-OP AVF) Referring Phys: [id number] SAMANTHA J RHYNE --------------------------------------------------------------------------------  Indications: Pre-access. Comparison Study: No prior Performing Technologist: Oda Cogan RDMS, RVT  Examination Guidelines: A complete evaluation includes B-mode imaging, spectral Doppler, color Doppler, and power Doppler as needed of all accessible portions of each vessel. Bilateral testing is considered an integral part of  a complete examination. Limited examinations for reoccurring indications may be performed as noted. +-----------------+-------------+----------+--------------+ Right Cephalic   Diameter (cm)Depth (cm)   Findings    +-----------------+-------------+----------+--------------+ Shoulder                                not visualized +-----------------+-------------+----------+--------------+ Prox upper arm                          not visualized +-----------------+-------------+----------+--------------+ Mid upper arm                           not visualized +-----------------+-------------+----------+--------------+ Dist upper arm                          not visualized +-----------------+-------------+----------+--------------+ Antecubital fossa                       not visualized +-----------------+-------------+----------+--------------+ Prox forearm                            not visualized +-----------------+-------------+----------+--------------+ Mid forearm          0.21                 branching    +-----------------+-------------+----------+--------------+ Dist forearm         0.21                              +-----------------+-------------+----------+--------------+ Wrist                0.20                              +-----------------+-------------+----------+--------------+ +-----------------+-------------+----------+--------+ Right Basilic    Diameter (cm)Depth (cm)Findings +-----------------+-------------+----------+--------+ Shoulder             0.44                        +-----------------+-------------+----------+--------+ Prox upper arm       0.44                        +-----------------+-------------+----------+--------+ Mid upper arm        0.37                        +-----------------+-------------+----------+--------+  Dist upper arm       0.45                         +-----------------+-------------+----------+--------+ Antecubital fossa    0.41                        +-----------------+-------------+----------+--------+ Prox forearm         0.27                        +-----------------+-------------+----------+--------+ Mid forearm          0.18                        +-----------------+-------------+----------+--------+ Distal forearm       0.13                        +-----------------+-------------+----------+--------+ Wrist                0.17                        +-----------------+-------------+----------+--------+ +-----------------+-------------+----------+--------+ Left Cephalic    Diameter (cm)Depth (cm)Findings +-----------------+-------------+----------+--------+ Prox upper arm       0.20                        +-----------------+-------------+----------+--------+ Mid upper arm        0.12                        +-----------------+-------------+----------+--------+ Dist upper arm       0.18                        +-----------------+-------------+----------+--------+ Antecubital fossa    0.19                        +-----------------+-------------+----------+--------+ Prox forearm         0.17                        +-----------------+-------------+----------+--------+ Mid forearm          0.18                        +-----------------+-------------+----------+--------+ Dist forearm         0.15                        +-----------------+-------------+----------+--------+ Wrist                0.17                        +-----------------+-------------+----------+--------+ +-----------------+-------------+----------+--------------+ Left Basilic     Diameter (cm)Depth (cm)   Findings    +-----------------+-------------+----------+--------------+ Prox upper arm       0.49                              +-----------------+-------------+----------+--------------+ Mid upper arm        0.36                               +-----------------+-------------+----------+--------------+ Dist  upper arm       0.35                              +-----------------+-------------+----------+--------------+ Antecubital fossa    0.29                 branching    +-----------------+-------------+----------+--------------+ Prox forearm         0.13                              +-----------------+-------------+----------+--------------+ Mid forearm                             not visualized +-----------------+-------------+----------+--------------+ Distal forearm                          not visualized +-----------------+-------------+----------+--------------+ *See table(s) above for measurements and observations.  Diagnosing physician: Servando Snare MD Electronically signed by Servando Snare MD on 09/06/2020 at 2:41:12 PM.    Final    HYBRID OR IMAGING ([place])  Result Date: 09/09/2020 There is no interpretation for this exam.  This order is for images obtained during a surgical procedure.  Please See "Surgeries" Tab for more information regarding the procedure.    Subjective: Eager to go home  Discharge Exam: Vitals:   09/11/20 1112 09/11/20 1125  BP: 129/75 127/66  Pulse: 84 81  Resp: 18   Temp: 98.5 F (36.9 C)   SpO2: 99%    Vitals:   09/11/20 1030 09/11/20 1100 09/11/20 1112 09/11/20 1125  BP: 118/71 124/68 129/75 127/66  Pulse: 79 85 84 81  Resp: 20 (!) 21 18   Temp:   98.5 F (36.9 C)   TempSrc:   Oral   SpO2: 97% 97% 99%   Weight:   69.6 kg   Height:        General: Pt is alert, awake, not in acute distress Cardiovascular: RRR, S1/S2 + Respiratory: CTA bilaterally, no wheezing, no rhonchi Abdominal: Soft, NT, ND, bowel sounds + Extremities: no edema, no cyanosis   The results of significant diagnostics from this hospitalization (including imaging, microbiology, ancillary and laboratory) are listed below for reference.     Microbiology: Recent  Results (from the past 240 hour(s))  Resp Panel by RT-PCR (Flu A&B, Covid) Nasopharyngeal Swab     Status: None   Collection Time: 09/02/20  7:33 PM   Specimen: Nasopharyngeal Swab; Nasopharyngeal(NP) swabs in vial transport medium  Result Value Ref Range Status   SARS Coronavirus 2 by RT PCR NEGATIVE NEGATIVE Final    Comment: (NOTE) SARS-CoV-2 target nucleic acids are NOT DETECTED.  The SARS-CoV-2 RNA is generally detectable in upper respiratory specimens during the acute phase of infection. The lowest concentration of SARS-CoV-2 viral copies this assay can detect is 138 copies/mL. A negative result does not preclude SARS-Cov-2 infection and should not be used as the sole basis for treatment or other patient management decisions. A negative result may occur with  improper specimen collection/handling, submission of specimen other than nasopharyngeal swab, presence of viral mutation(s) within the areas targeted by this assay, and inadequate number of viral copies(<138 copies/mL). A negative result must be combined with clinical observations, patient history, and epidemiological information. The expected result is Negative.  Fact Sheet for Patients:  EntrepreneurPulse.com.au  Fact Sheet for Healthcare  Providers:  IncredibleEmployment.be  This test is no t yet approved or cleared by the [place] and  has been authorized for detection and/or diagnosis of SARS-CoV-2 by FDA under an Emergency Use Authorization (EUA). This EUA will remain  in effect (meaning this test can be used) for the duration of the COVID-19 declaration under Section 564(b)(1) of the Act, 21 U.S.C.section 360bbb-3(b)(1), unless the authorization is terminated  or revoked sooner.       Influenza A by PCR NEGATIVE NEGATIVE Final   Influenza B by PCR NEGATIVE NEGATIVE Final    Comment: (NOTE) The Xpert Xpress SARS-CoV-2/FLU/RSV plus assay is intended as an aid in the  diagnosis of influenza from Nasopharyngeal swab specimens and should not be used as a sole basis for treatment. Nasal washings and aspirates are unacceptable for Xpert Xpress SARS-CoV-2/FLU/RSV testing.  Fact Sheet for Patients: EntrepreneurPulse.com.au  Fact Sheet for Healthcare Providers: IncredibleEmployment.be  This test is not yet approved or cleared by the [place] FDA and has been authorized for detection and/or diagnosis of SARS-CoV-2 by FDA under an Emergency Use Authorization (EUA). This EUA will remain in effect (meaning this test can be used) for the duration of the COVID-19 declaration under Section 564(b)(1) of the Act, 21 U.S.C. section 360bbb-3(b)(1), unless the authorization is terminated or revoked.  Performed at [facility], [place] [address] [place], [place] [address]   Surgical PCR screen     Status: None   Collection Time: 09/09/20  5:49 AM   Specimen: Nasal Mucosa; Nasal Swab  Result Value Ref Range Status   MRSA, PCR NEGATIVE NEGATIVE Final   Staphylococcus aureus NEGATIVE NEGATIVE Final    Comment: (NOTE) The Xpert SA Assay (FDA approved for NASAL specimens in patients 40 years of age and older), is one component of a comprehensive surveillance program. It is not intended to diagnose infection nor to guide or monitor treatment. Performed at [facility], [place] [address] [place], [place] [address]      Labs: BNP (last 3 results) Recent Labs    05/17/20 1546 07/23/20 1110  BNP 323.2* 165.5*   Basic Metabolic Panel: Recent Labs  Lab 09/08/20 0257 09/09/20 0258 09/10/20 0313 09/10/20 1601 09/11/20 0316  NA 136 134* 133* 135 133*  K 4.3 4.4 4.9 4.1 4.1  CL 106 103 103 103 101  CO2 22 21* 20* 25 24  GLUCOSE 98 81 74 105* 114*  BUN 64* 68* 76* 49* 57*  CREATININE 9.85* 9.56* 9.39* 7.02* 7.68*  CALCIUM 8.9 9.0 9.0 9.1 8.8*  PHOS 5.5* 6.2* 5.7* 4.2 4.6   Liver Function Tests: Recent  Labs  Lab 09/05/20 1150 09/07/20 0206 09/08/20 0257 09/09/20 0258 09/10/20 0313 09/10/20 1601 09/11/20 0316  AST 19  --   --   --   --   --   --   ALT 14  --   --   --   --   --   --   ALKPHOS 59  --   --   --   --   --   --   BILITOT 0.7  --   --   --   --   --   --   PROT 5.5*  --   --   --   --   --   --   ALBUMIN 3.0*   < > 3.1* 3.1* 3.2* 3.0* 2.9*   < > = values in this interval not displayed.  No results for input(s): LIPASE, AMYLASE in the last 168 hours. No results for input(s): AMMONIA in the last 168 hours. CBC: Recent Labs  Lab 09/05/20 1150 09/08/20 0257 09/10/20 0313 09/10/20 1601  WBC 3.2* 3.9* 5.0 4.9  HGB 9.0* 8.7* 8.1* 8.1*  HCT 27.0* 25.3* 24.1* 23.5*  MCV 91.8 90.7 92.3 90.0  PLT 67* 68* 75* 72*   Cardiac Enzymes: No results for input(s): CKTOTAL, CKMB, CKMBINDEX, TROPONINI in the last 168 hours. BNP: Invalid input(s): POCBNP CBG: Recent Labs  Lab 09/05/20 1244 09/10/20 2008  GLUCAP 124* 145*   D-Dimer No results for input(s): DDIMER in the last 72 hours. Hgb A1c No results for input(s): HGBA1C in the last 72 hours. Lipid Profile No results for input(s): CHOL, HDL, LDLCALC, TRIG, CHOLHDL, LDLDIRECT in the last 72 hours. Thyroid function studies No results for input(s): TSH, T4TOTAL, T3FREE, THYROIDAB in the last 72 hours.  Invalid input(s): FREET3 Anemia work up No results for input(s): VITAMINB12, FOLATE, FERRITIN, TIBC, IRON, RETICCTPCT in the last 72 hours. Urinalysis    Component Value Date/Time   COLORURINE YELLOW 01/31/2017 [place] 01/31/2017 1203   LABSPEC >1.046 (H) 01/31/2017 1203   PHURINE 5.0 01/31/2017 1203   GLUCOSEU NEGATIVE 01/31/2017 1203   HGBUR NEGATIVE 01/31/2017 1203   HGBUR negative 02/18/2009 0901   BILIRUBINUR NEGATIVE 01/31/2017 1203   KETONESUR NEGATIVE 01/31/2017 1203   PROTEINUR 100 (A) 01/31/2017 1203   UROBILINOGEN 0.2 01/02/2015 1945   NITRITE NEGATIVE 01/31/2017 1203    LEUKOCYTESUR NEGATIVE 01/31/2017 1203   Sepsis Labs Invalid input(s): PROCALCITONIN,  WBC,  LACTICIDVEN Microbiology Recent Results (from the past 240 hour(s))  Resp Panel by RT-PCR (Flu A&B, Covid) Nasopharyngeal Swab     Status: None   Collection Time: 09/02/20  7:33 PM   Specimen: Nasopharyngeal Swab; Nasopharyngeal(NP) swabs in vial transport medium  Result Value Ref Range Status   SARS Coronavirus 2 by RT PCR NEGATIVE NEGATIVE Final    Comment: (NOTE) SARS-CoV-2 target nucleic acids are NOT DETECTED.  The SARS-CoV-2 RNA is generally detectable in upper respiratory specimens during the acute phase of infection. The lowest concentration of SARS-CoV-2 viral copies this assay can detect is 138 copies/mL. A negative result does not preclude SARS-Cov-2 infection and should not be used as the sole basis for treatment or other patient management decisions. A negative result may occur with  improper specimen collection/handling, submission of specimen other than nasopharyngeal swab, presence of viral mutation(s) within the areas targeted by this assay, and inadequate number of viral copies(<138 copies/mL). A negative result must be combined with clinical observations, patient history, and epidemiological information. The expected result is Negative.  Fact Sheet for Patients:  EntrepreneurPulse.com.au  Fact Sheet for Healthcare Providers:  IncredibleEmployment.be  This test is no t yet approved or cleared by the [place] FDA and  has been authorized for detection and/or diagnosis of SARS-CoV-2 by FDA under an Emergency Use Authorization (EUA). This EUA will remain  in effect (meaning this test can be used) for the duration of the COVID-19 declaration under Section 564(b)(1) of the Act, 21 U.S.C.section 360bbb-3(b)(1), unless the authorization is terminated  or revoked sooner.       Influenza A by PCR NEGATIVE NEGATIVE Final   Influenza B  by PCR NEGATIVE NEGATIVE Final    Comment: (NOTE) The Xpert Xpress SARS-CoV-2/FLU/RSV plus assay is intended as an aid in the diagnosis of influenza from Nasopharyngeal swab specimens and should not be used as a  sole basis for treatment. Nasal washings and aspirates are unacceptable for Xpert Xpress SARS-CoV-2/FLU/RSV testing.  Fact Sheet for Patients: EntrepreneurPulse.com.au  Fact Sheet for Healthcare Providers: IncredibleEmployment.be  This test is not yet approved or cleared by the [place] FDA and has been authorized for detection and/or diagnosis of SARS-CoV-2 by FDA under an Emergency Use Authorization (EUA). This EUA will remain in effect (meaning this test can be used) for the duration of the COVID-19 declaration under Section 564(b)(1) of the Act, 21 U.S.C. section 360bbb-3(b)(1), unless the authorization is terminated or revoked.  Performed at [facility], [place] [address], [place], [place] [address]   Surgical PCR screen     Status: None   Collection Time: 09/09/20  5:49 AM   Specimen: Nasal Mucosa; Nasal Swab  Result Value Ref Range Status   MRSA, PCR NEGATIVE NEGATIVE Final   Staphylococcus aureus NEGATIVE NEGATIVE Final    Comment: (NOTE) The Xpert SA Assay (FDA approved for NASAL specimens in patients 97 years of age and older), is one component of a comprehensive surveillance program. It is not intended to diagnose infection nor to guide or monitor treatment. Performed at [facility], [place] [address], [place], [place] [address]    Time spent: 30 min  SIGNED:   Marylu Lund, MD  [facility] 09/11/2020, 1:46 PM  If 7PM-7AM, please contact night-coverage

## 2020-09-11 NOTE — Plan of Care (Signed)
  Problem: Education: Goal: Knowledge of General Education information will improve Description: Including pain rating scale, medication(s)/side effects and non-pharmacologic comfort measures 09/11/2020 1302 by Camillia Herter, RN Outcome: Adequate for Discharge 09/11/2020 0740 by Camillia Herter, RN Outcome: Progressing   Problem: Health Behavior/Discharge Planning: Goal: Ability to manage health-related needs will improve 09/11/2020 1302 by Camillia Herter, RN Outcome: Adequate for Discharge 09/11/2020 0740 by Camillia Herter, RN Outcome: Progressing   Problem: Clinical Measurements: Goal: Ability to maintain clinical measurements within normal limits will improve 09/11/2020 1302 by Camillia Herter, RN Outcome: Adequate for Discharge 09/11/2020 0740 by Camillia Herter, RN Outcome: Progressing Goal: Will remain free from infection 09/11/2020 1302 by Camillia Herter, RN Outcome: Adequate for Discharge 09/11/2020 0740 by Camillia Herter, RN Outcome: Progressing Goal: Diagnostic test results will improve 09/11/2020 1302 by Camillia Herter, RN Outcome: Adequate for Discharge 09/11/2020 0740 by Camillia Herter, RN Outcome: Progressing Goal: Respiratory complications will improve 09/11/2020 1302 by Camillia Herter, RN Outcome: Adequate for Discharge 09/11/2020 0740 by Camillia Herter, RN Outcome: Progressing Goal: Cardiovascular complication will be avoided 09/11/2020 1302 by Camillia Herter, RN Outcome: Adequate for Discharge 09/11/2020 0740 by Camillia Herter, RN Outcome: Progressing   Problem: Activity: Goal: Risk for activity intolerance will decrease 09/11/2020 1302 by Camillia Herter, RN Outcome: Adequate for Discharge 09/11/2020 0740 by Camillia Herter, RN Outcome: Progressing   Problem: Nutrition: Goal: Adequate nutrition will be maintained 09/11/2020 1302 by Camillia Herter, RN Outcome: Adequate for Discharge 09/11/2020 0740 by Camillia Herter, RN Outcome: Progressing   Problem:  Coping: Goal: Level of anxiety will decrease 09/11/2020 1302 by Camillia Herter, RN Outcome: Adequate for Discharge 09/11/2020 0740 by Camillia Herter, RN Outcome: Progressing   Problem: Elimination: Goal: Will not experience complications related to bowel motility 09/11/2020 1302 by Camillia Herter, RN Outcome: Adequate for Discharge 09/11/2020 0740 by Camillia Herter, RN Outcome: Progressing Goal: Will not experience complications related to urinary retention 09/11/2020 1302 by Camillia Herter, RN Outcome: Adequate for Discharge 09/11/2020 0740 by Camillia Herter, RN Outcome: Progressing   Problem: Pain Managment: Goal: General experience of comfort will improve 09/11/2020 1302 by Camillia Herter, RN Outcome: Adequate for Discharge 09/11/2020 0740 by Camillia Herter, RN Outcome: Progressing   Problem: Safety: Goal: Ability to remain free from injury will improve 09/11/2020 1302 by Camillia Herter, RN Outcome: Adequate for Discharge 09/11/2020 0740 by Camillia Herter, RN Outcome: Progressing   Problem: Skin Integrity: Goal: Risk for impaired skin integrity will decrease 09/11/2020 1302 by Camillia Herter, RN Outcome: Adequate for Discharge 09/11/2020 0740 by Camillia Herter, RN Outcome: Progressing

## 2020-09-11 NOTE — Progress Notes (Signed)
[facility] NEPHROLOGY PROGRESS NOTE  Assessment/ Plan: Pt is a 50 y.o. yo male with hypertension, CAD status post stent, AAA repair, stroke, biosynthetic AVR, CKD stage V followed by Dr. Joelyn Oms at CKD, admitted with chest pain now progressed to new ESRD.  #CKD stage V now progressed to new ESRD: Status post right IJ TDC and left first stage brachiobasilic AV fistula created by Dr. Stanford Breed on 09/09/2000.   Started HD on 6/28 and tolerated well. Receiving second HD Today and doing well so far. SW is arranging OP HD center. He can be discharged from renal perspective if OP HD center is arranged, d/w renal navigator. Will plan for 3rd HD tomorrow if remains in the hospital.   # Anemia of CKD: Iron saturation 40%, serum iron 109.  Start IV iron and ESA.  Monitor hemoglobin.  # Secondary hyperparathyroidism: Phosphorus level improved after HD. Calcium at goal.  # HTN/volume: Blood pressure soft.  DC doxazosin, minoxidil before and will dc amlodipine today.  Continue carvedilol, clonidine, hydralazine, Imdur.  May need to further titrate down the medication gradually.  Patient with history of refractory hypertension in the past.  #Recurrent chest pain: Continue cardiac medication.  Seen by cardiology.  Subjective: Seen and examined at HD unit. Tolerating dialysis well. Asking when he can go home. No CP, SOB, N or vomiting.  Objective Vital signs in last 24 hours: Vitals:   09/11/20 0835 09/11/20 0837 09/11/20 0842 09/11/20 0900  BP: 122/66 122/66 120/64 120/62  Pulse: 72 72 73 73  Resp: 16 16  18   Temp: 98.4 F (36.9 C) 98.4 F (36.9 C)    TempSrc: Oral Oral    SpO2: 97% 97%  100%  Weight:  72.4 kg    Height:       Weight change: 6.2 kg  Intake/Output Summary (Last 24 hours) at 09/11/2020 0930 Last data filed at 09/10/2020 1117 Gross per 24 hour  Intake --  Output 2500 ml  Net -2500 ml        Labs: Basic Metabolic Panel: Recent Labs  Lab 09/10/20 0313  09/10/20 1601 09/11/20 0316  NA 133* 135 133*  K 4.9 4.1 4.1  CL 103 103 101  CO2 20* 25 24  GLUCOSE 74 105* 114*  BUN 76* 49* 57*  CREATININE 9.39* 7.02* 7.68*  CALCIUM 9.0 9.1 8.8*  PHOS 5.7* 4.2 4.6    Liver Function Tests: Recent Labs  Lab 09/05/20 1150 09/07/20 0206 09/10/20 0313 09/10/20 1601 09/11/20 0316  AST 19  --   --   --   --   ALT 14  --   --   --   --   ALKPHOS 59  --   --   --   --   BILITOT 0.7  --   --   --   --   PROT 5.5*  --   --   --   --   ALBUMIN 3.0*   < > 3.2* 3.0* 2.9*   < > = values in this interval not displayed.    No results for input(s): LIPASE, AMYLASE in the last 168 hours. No results for input(s): AMMONIA in the last 168 hours. CBC: Recent Labs  Lab 09/05/20 1150 09/08/20 0257 09/10/20 0313 09/10/20 1601  WBC 3.2* 3.9* 5.0 4.9  HGB 9.0* 8.7* 8.1* 8.1*  HCT 27.0* 25.3* 24.1* 23.5*  MCV 91.8 90.7 92.3 90.0  PLT 67* 68* 75* 72*    Cardiac Enzymes: No results for  input(s): CKTOTAL, CKMB, CKMBINDEX, TROPONINI in the last 168 hours. CBG: Recent Labs  Lab 09/05/20 1244 09/10/20 2008  GLUCAP 124* 145*     Iron Studies: No results for input(s): IRON, TIBC, TRANSFERRIN, FERRITIN in the last 72 hours. Studies/Results: DG Chest Port 1 View  Result Date: 09/09/2020 CLINICAL DATA:  Dialysis catheter insertion EXAM: PORTABLE CHEST 1 VIEW COMPARISON:  09/02/2020 FINDINGS: Interval placement of right jugular central venous catheter tip in the right atrium. No pneumothorax. Aortic valve replacement. Aortic stent graft unchanged. Cardiac loop recorder unchanged. Interval development of diffuse bilateral airspace disease most likely edema. Mild bibasilar atelectasis IMPRESSION: Central line placement to the right atrium.  No pneumothorax Interval development of diffuse bilateral airspace disease likely pulmonary edema. Electronically Signed   By: Franchot Gallo M.D.   On: 09/09/2020 13:40   HYBRID OR IMAGING ([facility] ONLY)  Result Date:  09/09/2020 There is no interpretation for this exam.  This order is for images obtained during a surgical procedure.  Please See "Surgeries" Tab for more information regarding the procedure.    Medications: Infusions:  sodium chloride     sodium chloride     ferric gluconate (FERRLECIT) IVPB Stopped (09/10/20 1246)    Scheduled Medications:  acetaminophen  650 mg Oral Q6H   amLODipine  10 mg Oral Daily   arformoterol  15 mcg Nebulization BID   And   umeclidinium bromide  1 puff Inhalation Daily   aspirin  81 mg Oral Daily   atorvastatin  80 mg Oral Daily   carvedilol  25 mg Oral BID WC   Chlorhexidine Gluconate Cloth  6 each Topical Q0600   cloNIDine  0.2 mg Transdermal Weekly   [START ON 09/14/2020] darbepoetin (ARANESP) injection - DIALYSIS  60 mcg Intravenous Q Sat-HD   diclofenac Sodium  4 g Topical QID   feeding supplement  237 mL Oral BID BM   heparin  5,000 Units Subcutaneous Q12H   hydrALAZINE  50 mg Oral TID   isosorbide mononitrate  30 mg Oral Daily   lidocaine  1 patch Transdermal Q24H   multivitamin  1 tablet Oral QHS   mupirocin ointment  1 application Nasal BID   pantoprazole  40 mg Oral Daily    have reviewed scheduled and prn medications.  Physical Exam: General:NAD, comfortable, confused due to anesthesia Heart:RRR, s1s2 nl, no rub Lungs:clear b/l, no crackle Abdomen:soft, Non-tender, non-distended Extremities:No LE edema Dialysis Access: Right IJ TDC and left AV fistula site clean without bleeding.  Adanely Reynoso Prasad Lorella Gomez 09/11/2020,9:30 AM  LOS: 5 days

## 2020-09-11 NOTE — Plan of Care (Signed)

## 2020-09-12 ENCOUNTER — Telehealth: Payer: Self-pay

## 2020-09-12 NOTE — Telephone Encounter (Signed)
Transition Care Management Unsuccessful Follow-up Telephone Call  Date of discharge and from where:  [facility] on 09/11/2020  Attempts:  1st Attempt  Reason for unsuccessful TCM follow-up call:  Left voice message unable to reach at this time/\   Pt need appt with PCP.

## 2020-09-13 ENCOUNTER — Telehealth: Payer: Self-pay

## 2020-09-13 NOTE — Telephone Encounter (Signed)
Transition Care Management Unsuccessful Follow-up Telephone Call   Date of discharge and from where:  [facility] on 09/11/2020   Attempts:  2nd  Attempt   Reason for unsuccessful TCM follow-up call:  Left voice message unable to reach at this time/\    Pt need appt with PCP.

## 2020-09-16 ENCOUNTER — Other Ambulatory Visit: Payer: Self-pay

## 2020-09-16 ENCOUNTER — Encounter (HOSPITAL_COMMUNITY): Payer: Self-pay | Admitting: Emergency Medicine

## 2020-09-16 ENCOUNTER — Emergency Department (HOSPITAL_COMMUNITY)
Admission: EM | Admit: 2020-09-16 | Discharge: 2020-09-16 | Disposition: A | Discharge status: Home / Self Care | Payer: Medicare HMO | Attending: Student | Admitting: Student

## 2020-09-16 DIAGNOSIS — R29810 Facial weakness: Secondary | ICD-10-CM | POA: Diagnosis not present

## 2020-09-16 DIAGNOSIS — Z5321 Procedure and treatment not carried out due to patient leaving prior to being seen by health care provider: Secondary | ICD-10-CM | POA: Insufficient documentation

## 2020-09-16 LAB — CBC WITH DIFFERENTIAL/PLATELET
Abs Immature Granulocytes: 0.01 10*3/uL (ref 0.00–0.07)
Basophils Absolute: 0 10*3/uL (ref 0.0–0.1)
Basophils Relative: 1 %
Eosinophils Absolute: 0.2 10*3/uL (ref 0.0–0.5)
Eosinophils Relative: 3 %
HCT: 25.5 % — ABNORMAL LOW (ref 39.0–52.0)
Hemoglobin: 8.1 g/dL — ABNORMAL LOW (ref 13.0–17.0)
Immature Granulocytes: 0 %
Lymphocytes Relative: 23 %
Lymphs Abs: 1.1 10*3/uL (ref 0.7–4.0)
MCH: 31.2 pg (ref 26.0–34.0)
MCHC: 31.8 g/dL (ref 30.0–36.0)
MCV: 98.1 fL (ref 80.0–100.0)
Monocytes Absolute: 0.7 10*3/uL (ref 0.1–1.0)
Monocytes Relative: 15 %
Neutro Abs: 2.8 10*3/uL (ref 1.7–7.7)
Neutrophils Relative %: 58 %
Platelets: 87 10*3/uL — ABNORMAL LOW (ref 150–400)
RBC: 2.6 MIL/uL — ABNORMAL LOW (ref 4.22–5.81)
RDW: 15.2 % (ref 11.5–15.5)
WBC: 4.8 10*3/uL (ref 4.0–10.5)
nRBC: 0 % (ref 0.0–0.2)

## 2020-09-16 LAB — BASIC METABOLIC PANEL
Anion gap: 6 (ref 5–15)
BUN: 25 mg/dL — ABNORMAL HIGH (ref 6–20)
CO2: 32 mmol/L (ref 22–32)
Calcium: 8.3 mg/dL — ABNORMAL LOW (ref 8.9–10.3)
Chloride: 101 mmol/L (ref 98–111)
Creatinine, Ser: 4.39 mg/dL — ABNORMAL HIGH (ref 0.61–1.24)
GFR, Estimated: 16 mL/min — ABNORMAL LOW (ref >60)
Glucose, Bld: 107 mg/dL — ABNORMAL HIGH (ref 70–99)
Potassium: 4.1 mmol/L (ref 3.5–5.1)
Sodium: 139 mmol/L (ref 135–145)

## 2020-09-16 NOTE — ED Notes (Signed)
Called pt multiple times and checked outside pt is no where to be seen

## 2020-09-16 NOTE — ED Triage Notes (Signed)
Pt c/o right facial droop, inability to close his right eye all the way or raise his right eyebrow. Symptoms started 4 days ago. Pt A&O x 4, no weakness noted.

## 2020-09-16 NOTE — ED Provider Notes (Signed)
Emergency Medicine Provider Triage Evaluation Note  [name] , a 50 y.o. male  was evaluated in triage.  Pt complains of right-sided facial droop.  Symptoms present for the last 3 days, history of prior stroke, went to dialysis and they were concerned he may have had a stroke again.  Reports that he has had some intermittent vision changes but otherwise no other neurologic symptoms.  No numbness or weakness.  No headaches.  Review of Systems  Positive: Facial droop, vision changes Negative: Numbness, weakness  Physical Exam  BP (!) 167/74 (BP Location: Right Arm)   Pulse 84   Temp 98.4 F (36.9 C)   Resp 16   SpO2 99%  Gen:   Awake, no distress   Resp:  Normal effort  MSK:   Moves extremities without difficulty  Other:  On neurologic exam patient has right-sided facial paralysis that involves the forehead, not able to keep the right eyes closed against resistance.  5/5 strength in bilateral upper and lower extremities, sensation intact throughout, normal speech  Medical Decision Making  Medically screening exam initiated at 5:27 PM.  Appropriate orders placed.  LATROY GAYMON was informed that the remainder of the evaluation will be completed by another provider, this initial triage assessment does not replace that evaluation, and the importance of remaining in the ED until their evaluation is complete.  Exam seems most consistent with Bell's palsy, last known well time 3 days ago so patient is outside of the window for acute stroke intervention regardless, we will hold off on ordering additional imaging at this time.   Jacqlyn Larsen, PA-C 09/16/20 1735    Daleen Bo, MD 09/16/20 [phone]

## 2020-09-17 ENCOUNTER — Telehealth (INDEPENDENT_AMBULATORY_CARE_PROVIDER_SITE_OTHER): Payer: Self-pay | Admitting: Primary Care

## 2020-09-17 ENCOUNTER — Telehealth: Payer: Self-pay

## 2020-09-17 NOTE — Telephone Encounter (Signed)
Home Health Verbal Orders - Caller/Agency: Eliezer Lofts   with [facility] Number: [phone]  Requesting OT/PT/Skilled Nursing/Social Work/Speech Therapy: PT verbal order for 2 x week for 4 weeks to work on [place], balance, pain mgmt , mobility   Also request a nurse eval for med mgmt

## 2020-09-17 NOTE — TOC Transition Note (Signed)
Transition of care contact from inpatient facility  Date of discharge: 09/11/20 Date of contact: 09/13/20 Method: Phone Spoke to: No answer  Attempted to call Mr. Amor to discuss recent inpatient hospitalization, medications, and follow-up appointments; however, patient did not pick up the phone. A voicemail was left with callback number.  Tobie Poet, NP

## 2020-09-17 NOTE — Telephone Encounter (Signed)
Transition Care Management Unsuccessful Follow-up Telephone Call  Date of discharge and from where:  09/11/2020, [facility]. Patient also presented to ED 09/16/2020.   Attempts:  3rd Attempt  Reason for unsuccessful TCM follow-up call:  Left voice message on # [phone].  Letter sent to patient requesting he contact [facility] to schedule a follow up appointment

## 2020-09-18 ENCOUNTER — Telehealth: Payer: Self-pay

## 2020-09-18 ENCOUNTER — Ambulatory Visit (INDEPENDENT_AMBULATORY_CARE_PROVIDER_SITE_OTHER): Payer: Self-pay | Admitting: *Deleted

## 2020-09-18 NOTE — Telephone Encounter (Signed)
[name] with [facility] calling in. He has fistula  left arm arm put in 20 days ago.  His arm is swollen.   Not using it yet.   10/10 pain.  Difficulty moving his arm too.     Reason for Disposition  Ran out of BP medications    [facility] nurse calling in his BP readings.  He just took his BP medications while she was there on the visit.  Not missed any doses.  Says his BP runs high when they check it at dialysis.  Answer Assessment - Initial Assessment Questions 1. BLOOD PRESSURE: "What is the blood pressure?" "Did you take at least two measurements 5 minutes apart?"     190/94 home health nurse took it.    He just took his BP medications while nurse there.    Not missed any doses. 2. ONSET: "When did you take your blood pressure?"     Just now the home health nurse 3. HOW: "How did you obtain the blood pressure?" (e.g., visiting nurse, automatic home BP monitor)     Home health nurse  150-160/not remember when it's taken during dialysis. 4. HISTORY: "Do you have a history of high blood pressure?"     Yes 5. MEDICATIONS: "Are you taking any medications for blood pressure?" "Have you missed any doses recently?"     No 6. OTHER SYMPTOMS: "Do you have any symptoms?" (e.g., headache, chest pain, blurred vision, difficulty breathing, weakness)     AAsymptomatic 7. PREGNANCY: "Is there any chance you are pregnant?" "When was your last menstrual period?"     N/A  Protocols used: Blood Pressure - High-A-AH

## 2020-09-18 NOTE — Telephone Encounter (Signed)
Mary from [facility] calls today to report that patient c/o 10/10 pain in his left arm s/p AVF. He is able to move his arm and make a fist - he is having some limited ROM though as far as lifting his arm up and down. Says there are no s/s of infection and fingers do not hurt. She can palpate a thrill. Discussed with PA, patient to continue elevation and may try warm compresses. Advised patient of this and instructed to call back if he developed pain in his fingers, or any other issues. Patient verbalizes understanding.

## 2020-09-18 NOTE — Anesthesia Preprocedure Evaluation (Signed)
Anesthesia Evaluation  Patient identified by MRN, date of birth, ID band Patient awake    Reviewed: Allergy & Precautions, NPO status , Patient's Chart, lab work & pertinent test results  History of Anesthesia Complications Negative for: history of anesthetic complications  Airway Mallampati: II  TM Distance: >3 FB Neck ROM: Full    Dental  (+) Dental Advisory Given   Pulmonary shortness of breath, COPD, Current Smoker and Patient abstained from smoking.,    breath sounds clear to auscultation       Cardiovascular hypertension, Pt. on medications + angina + CAD, + Past MI and +CHF  + Valvular Problems/Murmurs AI  Rhythm:Regular     Neuro/Psych  Headaches, Seizures -,  PSYCHIATRIC DISORDERS Anxiety Depression CVA    GI/Hepatic GERD  Medicated and Controlled,  Endo/Other    Renal/GU Renal disease     Musculoskeletal   Abdominal   Peds  Hematology   Anesthesia Other Findings   Reproductive/Obstetrics                             Anesthesia Physical Anesthesia Plan  ASA: 4  Anesthesia Plan: MAC   Post-op Pain Management:    Induction: Intravenous  PONV Risk Score and Plan: 0 and Propofol infusion and Treatment may vary due to age or medical condition  Airway Management Planned: Nasal Cannula  Additional Equipment: None  Intra-op Plan:   Post-operative Plan:   Informed Consent: I have reviewed the patients History and Physical, chart, labs and discussed the procedure including the risks, benefits and alternatives for the proposed anesthesia with the patient or authorized representative who has indicated his/her understanding and acceptance.     Dental advisory given  Plan Discussed with: CRNA  Anesthesia Plan Comments:         Anesthesia Quick Evaluation

## 2020-09-18 NOTE — Telephone Encounter (Addendum)
Karna Dupes with [facility] calling in her report.    Pt has a new fistula that was put in 20 days ago (09/09/2020 per chart) in his Left upper extremity.  Pt is c/o of pain 10/10 on pain scale and he is having difficulty moving his arm due to the pain.   There is some swelling of the arm.   Temperature is equal to right arm.  Not used for dialysis yet.  His BP at her visit is 190/94.   Asymptomatic per [facility], no headaches, dizziness, blurry vision.    He commented in the background that he always has a high BP when they check it at the dialysis center.   Top number 150-160/ and he could not recall the bottom numbers but they always say it's high.  Mary asked if he had an appt set up with [facility] and when I looked I let her know he did not.   He does have a f/u with his vascular surgeon on 10/08/2020.   She asked for his name.   She mentioned she is going to call him also regarding the fistula.  Her name and contact:  Karna Dupes with [facility].   [phone] is her direct number.  I sent all this information to [facility] for Juluis Mire, NP.  I called the office and let them know I had sent this information to them as it doesn't look like he has been seen by them for a long time.  He hasn't been seen there since 2020.

## 2020-09-18 NOTE — Anesthesia Postprocedure Evaluation (Signed)
Anesthesia Post Note  Patient: KEYAAN LEDERMAN  Procedure(s) Performed: LEFT ARM ARTERIOVENOUS (AV) FISTULA CREATION (Left) INSERTION OF TUNNELED DIALYSIS PALINDROME 19cm CATHETER     Patient location during evaluation: PACU Anesthesia Type: MAC Level of consciousness: patient cooperative and awake Pain management: pain level controlled Vital Signs Assessment: post-procedure vital signs reviewed and stable Respiratory status: spontaneous breathing, nonlabored ventilation, respiratory function stable and patient connected to nasal cannula oxygen Cardiovascular status: blood pressure returned to baseline and stable Postop Assessment: no apparent nausea or vomiting Anesthetic complications: no   No notable events documented.  Last Vitals:  Vitals:   09/11/20 1112 09/11/20 1125  BP: 129/75 127/66  Pulse: 84 81  Resp: 18   Temp: 36.9 C   SpO2: 99%     Last Pain:  Vitals:   09/11/20 1125  TempSrc:   PainSc: 0-No pain                 Madalin Hughart

## 2020-09-18 NOTE — Telephone Encounter (Signed)
Number documented by [facility] agent is not for [facility].

## 2020-09-19 ENCOUNTER — Telehealth: Payer: Self-pay

## 2020-09-19 NOTE — Telephone Encounter (Signed)
Called [facility] to inform patient has not been seen in over a year -needs to be refer to vascular, cardiology or nephrology- she stated she had contacted Vascular

## 2020-09-19 NOTE — Telephone Encounter (Signed)
Jolayne Haines from [facility] calls today to report that she thinks patient needs to be seen sooner than follow up appt. He is still having a lot of pain and his arm is very swollen. He is able to move his arm and make a fist. Says the area is warm, but not hot and there is no drainage. Palpable thrill. She says she is very concerned about the fistula. He is currently dialzying via [place]. Advised her I had spoken to the patient yesterday; she was unaware of this. She would still like patient seen. Placed on schedule for evaluation.

## 2020-09-22 NOTE — Progress Notes (Deleted)
VASC[facility] SPECIALISTS OF [place] PROGRESS NOTE  ASSESSMENT / PLAN: [name] is a 50 y.o. male status post left first stage BVT and TDC placement 09/09/20. He has had persistent pain in his left arm since surgery. He has no evidence of steal syndrome. Ischemic monomelic neuropathy is a consideration, but the patient has no clear nerve deficit, nor does he have the typical risk factors (DM, advanced age, PAD, peripheral neuropathy), but his access is brachially based. I offered the patient ligation of the fistula with elective creation of new access in the near future. ***  SUBJECTIVE: ***  OBJECTIVE: There were no vitals taken for this visit. @INTAKEOUTPUTBRIEF @  Urine output over past 24 hours: ***  Constitutional: *** appearing. *** acute distress. CNS: *** Cardiac: ***. Pulmonary: *** Abdomen: *** Vascular: ***  CBC Latest Ref Rng & Units 09/16/2020 09/10/2020 09/10/2020  WBC 4.0 - 10.5 K/uL 4.8 4.9 5.0  Hemoglobin 13.0 - 17.0 g/dL 8.1(L) 8.1(L) 8.1(L)  Hematocrit 39.0 - 52.0 % 25.5(L) 23.5(L) 24.1(L)  Platelets 150 - 400 K/uL 87(L) 72(L) 75(L)     CMP Latest Ref Rng & Units 09/16/2020 09/11/2020 09/10/2020  Glucose 70 - 99 mg/dL 107(H) 114(H) 105(H)  BUN 6 - 20 mg/dL 25(H) 57(H) 49(H)  Creatinine 0.61 - 1.24 mg/dL 4.39(H) 7.68(H) 7.02(H)  Sodium 135 - 145 mmol/L 139 133(L) 135  Potassium 3.5 - 5.1 mmol/L 4.1 4.1 4.1  Chloride 98 - 111 mmol/L 101 101 103  CO2 22 - 32 mmol/L 32 24 25  Calcium 8.9 - 10.3 mg/dL 8.3(L) 8.8(L) 9.1  Total Protein 6.5 - 8.1 g/dL - - -  Total Bilirubin 0.3 - 1.2 mg/dL - - -  Alkaline Phos 38 - 126 U/L - - -  AST 15 - 41 U/L - - -  ALT 0 - 44 U/L - - -    Estimated Creatinine Clearance: 20.3 mL/min (A) (by C-G formula based on SCr of 4.39 mg/dL (H)).  ***  [name]. Stanford Breed, MD [facility] Specialists of [facility] Phone Number: [phone] 09/22/2020 2:34 PM

## 2020-09-24 ENCOUNTER — Ambulatory Visit (INDEPENDENT_AMBULATORY_CARE_PROVIDER_SITE_OTHER): Payer: Medicare HMO | Admitting: Vascular Surgery

## 2020-09-24 ENCOUNTER — Encounter: Payer: Self-pay | Admitting: Vascular Surgery

## 2020-09-24 ENCOUNTER — Ambulatory Visit: Payer: Medicare HMO | Admitting: Vascular Surgery

## 2020-09-24 ENCOUNTER — Other Ambulatory Visit: Payer: Self-pay

## 2020-09-24 VITALS — BP 152/91 | HR 122 | Temp 98.0°F | Resp 20 | Ht 71.0 in | Wt 139.9 lb

## 2020-09-24 DIAGNOSIS — Z992 Dependence on renal dialysis: Secondary | ICD-10-CM

## 2020-09-24 DIAGNOSIS — N186 End stage renal disease: Secondary | ICD-10-CM

## 2020-09-24 NOTE — Progress Notes (Signed)
VASCULAR AND VEIN SPECIALISTS OF [place] PROGRESS NOTE  ASSESSMENT / PLAN: [name] is a 50 y.o. male with postop hematoma after first stage basilic vein transposition. Counseled him that this will resolve slowly on its own. He is not interested in operative washout. We will see him again in 2 weeks.   SUBJECTIVE: Left arm painful about the surgical site. Swelling about the surgical site. No problem in the hand.  OBJECTIVE: BP (!) 152/91 (BP Location: Right Arm, Patient Position: Sitting, Cuff Size: Normal)   Pulse (!) 122   Temp 98 F (36.7 C) (Temporal)   Resp 20   Ht 5\' 11"  (1.803 m)   Wt 139 lb 14.4 oz (63.5 kg)   SpO2 98%   BMI 19.51 kg/m   Moderate (2-3cm) incisional hematoma in left medial arm Good thrill in fistla 2+ radial pulse  CBC Latest Ref Rng & Units 09/16/2020 09/10/2020 09/10/2020  WBC 4.0 - 10.5 K/uL 4.8 4.9 5.0  Hemoglobin 13.0 - 17.0 g/dL 8.1(L) 8.1(L) 8.1(L)  Hematocrit 39.0 - 52.0 % 25.5(L) 23.5(L) 24.1(L)  Platelets 150 - 400 K/uL 87(L) 72(L) 75(L)     CMP Latest Ref Rng & Units 09/16/2020 09/11/2020 09/10/2020  Glucose 70 - 99 mg/dL 107(H) 114(H) 105(H)  BUN 6 - 20 mg/dL 25(H) 57(H) 49(H)  Creatinine 0.61 - 1.24 mg/dL 4.39(H) 7.68(H) 7.02(H)  Sodium 135 - 145 mmol/L 139 133(L) 135  Potassium 3.5 - 5.1 mmol/L 4.1 4.1 4.1  Chloride 98 - 111 mmol/L 101 101 103  CO2 22 - 32 mmol/L 32 24 25  Calcium 8.9 - 10.3 mg/dL 8.3(L) 8.8(L) 9.1  Total Protein 6.5 - 8.1 g/dL - - -  Total Bilirubin 0.3 - 1.2 mg/dL - - -  Alkaline Phos 38 - 126 U/L - - -  AST 15 - 41 U/L - - -  ALT 0 - 44 U/L - - -    Estimated Creatinine Clearance: 18.5 mL/min (A) (by C-G formula based on SCr of 4.39 mg/dL (H)).  [name]. Stanford Breed, MD Vascular and Vein Specialists of [facility] Phone Number: [phone] 09/24/2020 4:38 PM

## 2020-09-27 ENCOUNTER — Other Ambulatory Visit: Payer: Self-pay | Admitting: Physician Assistant

## 2020-09-27 MED ORDER — OXYCODONE-ACETAMINOPHEN 5-325 MG PO TABS: 1.0000 | ORAL_TABLET | ORAL | 0 refills | Status: DC | PRN

## 2020-09-30 ENCOUNTER — Other Ambulatory Visit: Payer: Self-pay

## 2020-09-30 DIAGNOSIS — N186 End stage renal disease: Secondary | ICD-10-CM

## 2020-10-01 NOTE — H&P (View-Only) (Signed)
Date:  10/02/2020   ID:  [name], DOB September 29, 1970, MRN [id number]   Provider location: [place] Advanced [facility] Failure Type of Visit: Established patient   PCP:  Kerin Perna, NP  HF Cardiologist:  Dr. Aundra Dubin   History of Present Illness: [name] is a 50 y.o. 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  The descending thoracic aorta has dilated to 5.1 cm.  Cardiac catheterization in April 2016 demonstrated 60% first diagonal lesion.  He underwent aortic valve replacement with bovine pericardial tissue valve and reconstruction along with aortic root replacement and endovascular repair of the descending thoracic aortic aneurysm at [facility].  He has had intermittent chest discomfort since then.   Admitted in 12/02/2017 w/ chest pain. Had NSTEMI. He had LHC with stent placed proximal ramus.  Repeat cath in 7/20 with nonobstructive disease.   He was admitted in 9/21 with atypical chest pain.  Echo showed EF 60-65% with severe LVH, normal RV, bioprosthetic aortic valve with mean gradient 18 mmHg.  Cardiolite showed no ischemia.   Last visit w/ Dr. Aundra Dubin 2/22, he continued to complain of chronic non-exertional CP. Symptoms c/w GERD, however not relived w/ PPI. He was referred to GI for further evaluation. CXR was also obtained and unremarkable. His BP had been elevated and meds adjusted. Hydralazine 25 mg tid was added and he was ordered to get renal artery dopplers to assess for RAS. This showed moderate RAS on the rt w/ 1-59% stenosis. The left RA was normal. He was scheduled for return f/u  w/ PharmD 3/22 for reassessment of BP and further med titration, if needed, but he no showed.   He ended up going to the ED on 05/17/20 for CP. EKG showed no changes. Found to have elevated HS trop of 136 (prior baseline levels 60-80). This was also in the setting of worsening SCr, up to 3.92. General cardiology was consulted. Echo was completed and showed preserved LVEF severe LVH, increased mean gradient of bioprosthetic AVR, 31 mmHg (see full report below). It was noted that he had diffuse tenderness/pain on exam of chest, and there are palpable ribs that are displaced/pop when palpated. He was given trial of topical lidoderm patch, which helped some.  His elevated troponins and pain were not consistent with NSTEMI, suspect that this is due to stress from pain and worsening CKD. He was discharged home w/ Lidoderm patch for pain.   He returned to clinic 6/22 for HF follow up. He had seen nephrology recently, discussions had about needing HD. Overall feeling tired. SOB with stairs, general fatigue consistently. Weight at home 152 pounds. BP at home ~170. Jardiance was stopped this visit with GFR 14, and hydralazine was increased for elevated BP.  He presented to [facility] 09/02/20 with CP. HsT 85 > 79> 76, in the setting of CKD progressing to ESRD. EKG without acute ischemic change, LVH and repolarization abnormalities. CRP 0.6 and ESR 6 WNL, less likely pericarditis/myocarditis. Repeat echo showed EF>75%, hyperdynamic, Grade I DD, RV ok, moderate bi-atrial enlargement, moderate AS. Cardiology felt  pain was non-cardiac in origin. Nephrology was consulted as patient's SCr was 6.9 on admission; he displayed no uremia symptoms. AV fistula and temporary dialysis catheter were placed 09/09/20 by [facility]. Doxazosin, minoxidil and amlodipine were discontinued due to soft BP. He was continued on carvedilol, clonidine, hydralazine, Imdur.  He tolerated iHD in the hospital well and was set up for M/T/Th/S at discharge. He was  evaluated by ophthalmology for new bilateral blurred vision. Felt to have dry eye, recommended artifical tears, new Rx for glasses, and control of blood pressure. Discharge weight was 153.7 lbs.   He presented back to [facility] 09/16/20 with complaints of right facial droop after being seen for dialysis. Exam was consistent with Bell's palsy, last known well time 3 days prior so patient was outside of the window for acute stroke intervention. He was asked to stay for further evaluation however he left.  Today he returns for post hospitalization HF follow up. Overall feeling fine. Adjusting to dialysis. Denies increasing SOB, CP, dizziness, edema, or PND/Orthopnea. Appetite ok. No fever or chills. His dry weight is 143 lbs at dialysis. Taking all medications. BP at home ~160-170. He is still urinating daily.  ECG (personally reviewed): ST with LVH.  Labs (10/19): K 4.1, creatinine 1.78, myeloma panel negative Labs (2/20): K 4.2, creatinine 1.75, LDL 128 Labs (7/21): LDL 66 Labs (9/21): K 4.1, creatinine 3.31, hgb 11.9 Labs (10/21): K 4, creatinine 2.96, hgb 12.8 Labs (2/22): SCr 3.90, 4.0, hgb 12.7 Labs (3/22): SCr 3.92, 3.7, hgb 12.0 Labs (3/22): Scr 4.01, 3.8, hgb 11.5  Labs (5/22): K 4.6, creatinine 4.8 Labs (7/22): K 4.1, creatinine 4.39  Review of Systems: All systems reviewed and negative except as per HPI.    PMH: 1. H/o ankle fractures bilaterally 2. GERD 3. HTN: Negative urinary catecholeamine collection for pheochromocytoma.  4. CKD: Now ESRD. Suspect hypertensive nephropathy. Now s/p TDC and AV fistula 6/22. 5. Cardiomyopathy: Most likely due to heavy ETOH ingestion and uncontrolled HTN.   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  Lexiscan myoview (4/13): EF 37%, global hypokinesis, no ischemic or infarction.  Echo (12/14) with EF 45-50%, diffuse hypokinesis,  moderate LVH, mild MR, moderate AI.  Echo (4/16) with EF 55-60%, severe LVH, mild LV dilation, moderate AI and moderate MR.   - Echo (9/19): EF 35-40% with diffuse hypokinesis, severe LVH, bioprosthetic aortic valve functioning normally.  - PYP scan (1/20): No evidence for TTR amyloidosis.  - Echo (9/21): EF 60-65%, severe LVH, normal RV, bioprosthetic aortic valve with mean gradient 18 mmHg.  - Echo (3/22): EF 60-65%, severe LV dysfunction, Grade II DD, RV ok, severe LAE, moderate RAE, small pericardial effusion, moderate AS, Aortic valve mean gradient measures 31.7 mmHg. - Echo (6/22): EF>75%, hyperdynamic, Grade I DD, RV ok, moderate bi-atrial enlargement, moderate AS, mean gradient measures 33.5 mmHg. 6. ETOH abuse: Quit 3/13.  7. Active smoker. 8. Valvular heart disease: Echo in 3/13 showed moderate to severe eccentric MR and mild to moderate AI with prolapsing left coronary cusp. Echo 12/14 with mild MR and moderate AI.  Echo 4/16 with moderate AI and moderate MR.  - S/p Bentall procedure with bioprosthetic aortic valve 9/16.  9.  CVA: Has ILR. 10. Type B aortic dissection: Occurred in 2/16, from just distal to the left subclavian.  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    -  10/03/2014 First stage median sternotomy with right axillary cannulation for aortic valved conduit root replacement with coronary reconstruction (button Bentall Procedure), 27 mm Sorin Mitroflow bovine pericardial valved conduit, total arch replacement (25m Vascutek BSomaliamultibranch arch graft with individual head vessel reimplantation. - Status post  open repair of an Extent III TAA secondary to chronic dissection with a # [id number] "Coselli" multibranch Dacron graft with individual celiac, SMA, and bilateral renal artery implantation on 01/04/2017, [facility].  11. CAD: LHC (4/16) with 60% ostial D1.   - NSTEMI 9/19: LHC (9/19) showed 50% proximal LCx, 90% ramus => DES to ramus.  - LHC (7/20): D1  60% stenosis, patent ramus stent.  - Cardiolite (9/21): Inferior fixed defect, no ischemia.  12. Sleep study (10/21) with no significant OSA.   Current Outpatient Medications  Medication Sig Dispense Refill   acetaminophen (TYLENOL) 325 MG tablet Take 650 mg by mouth every 6 (six) hours as needed for moderate pain (for pain or headaches).     albuterol (VENTOLIN HFA) 108 (90 Base) MCG/ACT inhaler Inhale 2 puffs into the lungs every 6 (six) hours as needed for wheezing or shortness of breath.     aspirin EC 81 MG EC tablet Take 1 tablet (81 mg total) by mouth daily. 90 tablet 3   atorvastatin (LIPITOR) 80 MG tablet Take 1 tablet (80 mg total) by mouth daily. 30 tablet 2   Blood Pressure Monitor KIT 1 Device by Does not apply route 2 (two) times daily. 1 kit 0   carvedilol (COREG) 25 MG tablet Take 25 mg by mouth 2 (two) times daily with a meal.     Evolocumab (REPATHA SURECLICK) 14283G/ML SOAJ Inject 1 Dose into the skin every 14 (fourteen) days. 2 mL 11   hydrALAZINE (APRESOLINE) 50 MG tablet Take 1 tablet (50 mg total) by mouth 3 (three) times daily. 90 tablet 0   isosorbide mononitrate (IMDUR) 30 MG 24 hr tablet Take 1 tablet (30 mg total) by mouth daily. 30 tablet 0   lidocaine (LIDODERM) 5 % PLACE 1 PATCH ONTO THE SKIN EVERY 12 HOURS REMOVE AND DISCARD PATCH WITHIN 12 HOURS OR AS DIRECTED BY MD 180 patch 3   oxyCODONE-acetaminophen (PERCOCET) 5-325 MG tablet Take 1 tablet by mouth every 4 (four) hours as needed for severe pain. 12 tablet 0   pantoprazole (PROTONIX) 40 MG tablet Take 1 tablet (40 mg total) by mouth daily. 30 tablet 6   STIOLTO RESPIMAT 2.5-2.5 MCG/ACT AERS INHALE 2 PUFFS INTO THE LUNGS DAILY 4 g 5   No current facility-administered medications for this encounter.    Allergies:   Imdur [isosorbide nitrate] and Tramadol   Social History:  The patient  reports that he has been smoking cigarettes. He has a 5.75 pack-year smoking history. He has never used smokeless tobacco.  He reports current alcohol use of about 1.0 standard drink of alcohol per week. He reports current drug use. Drug: Marijuana.   Family History:  The patient's family history includes Colon cancer in his paternal uncle; Diabetes in his maternal aunt; Headache in his sister; Heart attack in his brother; Hypertension in his brother, mother, and another family member; Lung cancer in his maternal uncle; Other in his sister; Stroke in his maternal aunt and maternal uncle; Thyroid disease in his sister.   ROS:  Please see the history of present illness.   All other systems are personally reviewed and negative.   Exam:   BP (!) 170/76   Pulse (![name]  110   Wt 65.8 kg (145 lb)   SpO2 98%   BMI 20.22 kg/m  General:  NAD. No resp difficulty HEENT: Normal Neck: Supple. No JVD. Carotids 2+ bilat; no bruits. No lymphadenopathy or thryomegaly appreciated. Cor: PMI nondisplaced. Regular rate & rhythm. No rubs, gallops, III/VI systolic murmur Lungs: Clear Abdomen: Soft, nontender, nondistended. No hepatosplenomegaly. No bruits or masses. Good bowel sounds. Extremities: No cyanosis, clubbing, rash, edema; left AVF + bruit/thrill Neuro: Alert & oriented x 3, cranial nerves grossly intact. Moves all 4 extremities w/o difficulty. Affect pleasant.  Recent Labs: 07/23/2020: B Natriuretic Peptide 334.1 09/05/2020: ALT 14; TSH 3.472 09/16/2020: BUN 25; Creatinine, Ser 4.39; Hemoglobin 8.1; Platelets 87; Potassium 4.1; Sodium 139  Personally reviewed   Wt Readings from Last 3 Encounters:  10/02/20 65.8 kg (145 lb)  09/24/20 63.5 kg (139 lb 14.4 oz)  09/11/20 69.6 kg (153 lb 7 oz)    ASSESSMENT AND PLAN:  1. CAD: 9/19 NSTEMI with PCI to proximal ramus.  LHC in 7/20 with no obstructive disease.  Admission in 9/21 with Cardiolite showing no ischemia. Recent Admit 3/22 for CP, suspected to be musculoskeletal.  He has long-standing primarily non-exertional chest pain.  I suspect that it may be musculoskeletal and  related to his prior surgeries. No anginal symptomatology. Pain is relieved with acetaminophen use.   - he plans to return to [facility] for f/u  - ? Any benefit from nerve block for chronic pain - Continue ASA 81 + ? blocker. - He is on Repatha via the lipid clinic.  2. 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 Myeloma panel negative and PYP scan negative, suspect due more likely to HTN than hypertrophic cardiomyopathy.  Cannot get cardiac MRI with elevated creatinine.  Echo in 9/21 showed EF up to 60-65%, severe LVH, normal RV. Recent echo 3/22 EF 60-65%, severe LVH, RV normal. normalized again in 05/2020. Echo from 09/03/2020 EF > 75%, grade 1 DD, LV hyperdynamic function; concern for speckled appearance.  Light chains/SPEP/UPEP ordered. Consider repeating PYP. NYHA class II symptoms. Euvolemic on exam. GDMT limited due to ESRD.  - Increase hydralazine to 75 mg tid. - Continue Coreg 25 mg bid.  - Continue Imdur 30 mg daily. - He is off spironolactone, Jardiance and Entresto with ESRD. 3. HTN: Still needs better control.  Sleep study in 10/21 was negative for OSA.  - Amlodipine, minoxidil, and Cardura stopped last admission due to soft BP.  - Increase hydralazine to 75 mg tid. - Continue carvedilol, clonidine, Imdur - He does not have other good options for antihypertensives.  - Renal artery dopplers completed 3/22 to rule out renal artery stenosis and showed mild Rt RAS 1-59%. No Lt sided RAS.  4. S/P Bioprothetic AVR: Echo in 6/22 with increase in mean gradient (33.5 mmHg). Personally discussed with Dr. Aundra Dubin, will arrange for TEE. - SBE prophylaxis w/ dental work.  5. S/P 2016 and 2018 thoracic aortic aneurysm repair: Followed by Dr. [name] at [facility].  - lost to f/u. He plans to call to make an appointment. 6. ESRD: New SCr baseline closer to 4 range, nonoliguric.  He is now on iHD M/T/Th/S as of 09/10/20. - He is followed by  [facility]. - BMET today.    7. Active smoker:  Smoking cessation advised, he has been cutting back, down to 3 cigs/day.  Follow up in 6 weeks for APP for BP check and in  3 months with Dr. Aundra Dubin. Will arrange for TEE to evaluate AVR.   Signed, Rafael Bihari, FNP -[facility] 10/02/2020  [place] [address] [facility] and [place] [address] [phone] (office) [phone] (fax)

## 2020-10-01 NOTE — Progress Notes (Signed)
    Date:  10/02/2020   ID:  [name], DOB 02/10/1971, MRN [id number]   Provider location: [facility] Type of Visit: Established patient   PCP:  Edwards, Michelle P, NP  HF Cardiologist:  Dr. McLean   History of Present Illness: [name] is a 50 y.o. 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  The descending thoracic aorta has dilated to 5.1 cm.  Cardiac catheterization in April 2016 demonstrated 60% first diagonal lesion.  He underwent aortic valve replacement with bovine pericardial tissue valve and reconstruction along with aortic root replacement and endovascular repair of the descending thoracic aortic aneurysm at [facility].  He has had intermittent chest discomfort since then.   Admitted in 12/02/2017 w/ chest pain. Had NSTEMI. He had LHC with stent placed proximal ramus.  Repeat cath in 7/20 with nonobstructive disease.   He was admitted in 9/21 with atypical chest pain.  Echo showed EF 60-65% with severe LVH, normal RV, bioprosthetic aortic valve with mean gradient 18 mmHg.  Cardiolite showed no ischemia.   Last visit w/ Dr. McLean 2/22, he continued to complain of chronic non-exertional CP. Symptoms c/w GERD, however not relived w/ PPI. He was referred to GI for further evaluation. CXR was also obtained and unremarkable. His BP had been elevated and meds adjusted. Hydralazine 25 mg tid was added and he was ordered to get renal artery dopplers to assess for RAS. This showed moderate RAS on the rt w/ 1-59% stenosis. The left RA was normal. He was scheduled for return f/u  w/ PharmD 3/22 for reassessment of BP and further med titration, if needed, but he no showed.   He ended up going to the ED on 05/17/20 for CP. EKG showed no changes. Found to have elevated HS trop of 136 (prior baseline levels 60-80). This was also in the setting of worsening SCr, up to 3.92. General cardiology was consulted. Echo was completed and showed preserved LVEF severe LVH, increased mean gradient of bioprosthetic AVR, 31 mmHg (see full report below). It was noted that he had diffuse tenderness/pain on exam of chest, and there are palpable ribs that are displaced/pop when palpated. He was given trial of topical lidoderm patch, which helped some.  His elevated troponins and pain were not consistent with NSTEMI, suspect that this is due to stress from pain and worsening CKD. He was discharged home w/ Lidoderm patch for pain.   He returned to clinic 6/22 for HF follow up. He had seen nephrology recently, discussions had about needing HD. Overall feeling tired. SOB with stairs, general fatigue consistently. Weight at home 152 pounds. BP at home ~170. Jardiance was stopped this visit with GFR 14, and hydralazine was increased for elevated BP.  He presented to [facility] 09/02/20 with CP. HsT 85 > 79> 76, in the setting of CKD progressing to ESRD. EKG without acute ischemic change, LVH and repolarization abnormalities. CRP 0.6 and ESR 6 WNL, less likely pericarditis/myocarditis. Repeat echo showed EF>75%, hyperdynamic, Grade I DD, RV ok, moderate bi-atrial enlargement, moderate AS. Cardiology felt   pain was non-cardiac in origin. Nephrology was consulted as patient's SCr was 6.9 on admission; he displayed no uremia symptoms. AV fistula and temporary dialysis catheter were placed 09/09/20 by [facility]. Doxazosin, minoxidil and amlodipine were discontinued due to soft BP. He was continued on carvedilol, clonidine, hydralazine, Imdur.  He tolerated iHD in the hospital well and was set up for M/T/Th/S at discharge. He was  evaluated by ophthalmology for new bilateral blurred vision. Felt to have dry eye, recommended artifical tears, new Rx for glasses, and control of blood pressure. Discharge weight was 153.7 lbs.   He presented back to [facility] 09/16/20 with complaints of right facial droop after being seen for dialysis. Exam was consistent with Bell's palsy, last known well time 3 days prior so patient was outside of the window for acute stroke intervention. He was asked to stay for further evaluation however he left.  Today he returns for post hospitalization HF follow up. Overall feeling fine. Adjusting to dialysis. Denies increasing SOB, CP, dizziness, edema, or PND/Orthopnea. Appetite ok. No fever or chills. His dry weight is 143 lbs at dialysis. Taking all medications. BP at home ~160-170. He is still urinating daily.  ECG (personally reviewed): ST with LVH.  Labs (10/19): K 4.1, creatinine 1.78, myeloma panel negative Labs (2/20): K 4.2, creatinine 1.75, LDL 128 Labs (7/21): LDL 66 Labs (9/21): K 4.1, creatinine 3.31, hgb 11.9 Labs (10/21): K 4, creatinine 2.96, hgb 12.8 Labs (2/22): SCr 3.90, 4.0, hgb 12.7 Labs (3/22): SCr 3.92, 3.7, hgb 12.0 Labs (3/22): Scr 4.01, 3.8, hgb 11.5  Labs (5/22): K 4.6, creatinine 4.8 Labs (7/22): K 4.1, creatinine 4.39  Review of Systems: All systems reviewed and negative except as per HPI.    PMH: 1. H/o ankle fractures bilaterally 2. GERD 3. HTN: Negative urinary catecholeamine collection for pheochromocytoma.  4. CKD: Now ESRD. Suspect hypertensive nephropathy. Now s/p TDC and AV fistula 6/22. 5. Cardiomyopathy: Most likely due to heavy ETOH ingestion and uncontrolled HTN.   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  Lexiscan myoview (4/13): EF 37%, global hypokinesis, no ischemic or infarction.  Echo (12/14) with EF 45-50%, diffuse hypokinesis,  moderate LVH, mild MR, moderate AI.  Echo (4/16) with EF 55-60%, severe LVH, mild LV dilation, moderate AI and moderate MR.   - Echo (9/19): EF 35-40% with diffuse hypokinesis, severe LVH, bioprosthetic aortic valve functioning normally.  - PYP scan (1/20): No evidence for TTR amyloidosis.  - Echo (9/21): EF 60-65%, severe LVH, normal RV, bioprosthetic aortic valve with mean gradient 18 mmHg.  - Echo (3/22): EF 60-65%, severe LV dysfunction, Grade II DD, RV ok, severe LAE, moderate RAE, small pericardial effusion, moderate AS, Aortic valve mean gradient measures 31.7 mmHg. - Echo (6/22): EF>75%, hyperdynamic, Grade I DD, RV ok, moderate bi-atrial enlargement, moderate AS, mean gradient measures 33.5 mmHg. 6. ETOH abuse: Quit 3/13.  7. Active smoker. 8. Valvular heart disease: Echo in 3/13 showed moderate to severe eccentric MR and mild to moderate AI with prolapsing left coronary cusp. Echo 12/14 with mild MR and moderate AI.  Echo 4/16 with moderate AI and moderate MR.  - S/p Bentall procedure with bioprosthetic aortic valve 9/16.  9.  CVA: Has ILR. 10. Type B aortic dissection: Occurred in 2/16, from just distal to the left subclavian.  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    -   10/03/2014 First stage median sternotomy with right axillary cannulation for aortic valved conduit root replacement with coronary reconstruction (button Bentall Procedure), 27 mm Sorin Mitroflow bovine pericardial valved conduit, total arch replacement (24mm Vascutek [place] multibranch arch graft with individual head vessel reimplantation. - Status post  open repair of an Extent III TAA secondary to chronic dissection with a # 22mm Vascutek "Coselli" multibranch Dacron graft with individual celiac, SMA, and bilateral renal artery implantation on 01/04/2017, [facility].  11. CAD: LHC (4/16) with 60% ostial D1.   - NSTEMI 9/19: LHC (9/19) showed 50% proximal LCx, 90% ramus => DES to ramus.  - LHC (7/20): D1  60% stenosis, patent ramus stent.  - Cardiolite (9/21): Inferior fixed defect, no ischemia.  12. Sleep study (10/21) with no significant OSA.   Current Outpatient Medications  Medication Sig Dispense Refill   acetaminophen (TYLENOL) 325 MG tablet Take 650 mg by mouth every 6 (six) hours as needed for moderate pain (for pain or headaches).     albuterol (VENTOLIN HFA) 108 (90 Base) MCG/ACT inhaler Inhale 2 puffs into the lungs every 6 (six) hours as needed for wheezing or shortness of breath.     aspirin EC 81 MG EC tablet Take 1 tablet (81 mg total) by mouth daily. 90 tablet 3   atorvastatin (LIPITOR) 80 MG tablet Take 1 tablet (80 mg total) by mouth daily. 30 tablet 2   Blood Pressure Monitor KIT 1 Device by Does not apply route 2 (two) times daily. 1 kit 0   carvedilol (COREG) 25 MG tablet Take 25 mg by mouth 2 (two) times daily with a meal.     Evolocumab (REPATHA SURECLICK) 140 MG/ML SOAJ Inject 1 Dose into the skin every 14 (fourteen) days. 2 mL 11   hydrALAZINE (APRESOLINE) 50 MG tablet Take 1 tablet (50 mg total) by mouth 3 (three) times daily. 90 tablet 0   isosorbide mononitrate (IMDUR) 30 MG 24 hr tablet Take 1 tablet (30 mg total) by mouth daily. 30 tablet 0   lidocaine (LIDODERM) 5 % PLACE 1 PATCH ONTO THE SKIN EVERY 12 HOURS REMOVE AND DISCARD PATCH WITHIN 12 HOURS OR AS DIRECTED BY MD 180 patch 3   oxyCODONE-acetaminophen (PERCOCET) 5-325 MG tablet Take 1 tablet by mouth every 4 (four) hours as needed for severe pain. 12 tablet 0   pantoprazole (PROTONIX) 40 MG tablet Take 1 tablet (40 mg total) by mouth daily. 30 tablet 6   STIOLTO RESPIMAT 2.5-2.5 MCG/ACT AERS INHALE 2 PUFFS INTO THE LUNGS DAILY 4 g 5   No current facility-administered medications for this encounter.    Allergies:   Imdur [isosorbide nitrate] and Tramadol   Social History:  The patient  reports that he has been smoking cigarettes. He has a 5.75 pack-year smoking history. He has never used smokeless tobacco.  He reports current alcohol use of about 1.0 standard drink of alcohol per week. He reports current drug use. Drug: Marijuana.   Family History:  The patient's family history includes Colon cancer in his paternal uncle; Diabetes in his maternal aunt; Headache in his sister; Heart attack in his brother; Hypertension in his brother, mother, and another family member; Lung cancer in his maternal uncle; Other in his sister; Stroke in his maternal aunt and maternal uncle; Thyroid disease in his sister.   ROS:  Please see the history of present illness.   All other systems are personally reviewed and negative.   Exam:   BP (!) 170/76   Pulse (!)   110   Wt 65.8 kg (145 lb)   SpO2 98%   BMI 20.22 kg/m  General:  NAD. No resp difficulty HEENT: Normal Neck: Supple. No JVD. Carotids 2+ bilat; no bruits. No lymphadenopathy or thryomegaly appreciated. Cor: PMI nondisplaced. Regular rate & rhythm. No rubs, gallops, III/VI systolic murmur Lungs: Clear Abdomen: Soft, nontender, nondistended. No hepatosplenomegaly. No bruits or masses. Good bowel sounds. Extremities: No cyanosis, clubbing, rash, edema; left AVF + bruit/thrill Neuro: Alert & oriented x 3, cranial nerves grossly intact. Moves all 4 extremities w/o difficulty. Affect pleasant.  Recent Labs: 07/23/2020: B Natriuretic Peptide 334.1 09/05/2020: ALT 14; TSH 3.472 09/16/2020: BUN 25; Creatinine, Ser 4.39; Hemoglobin 8.1; Platelets 87; Potassium 4.1; Sodium 139  Personally reviewed   Wt Readings from Last 3 Encounters:  10/02/20 65.8 kg (145 lb)  09/24/20 63.5 kg (139 lb 14.4 oz)  09/11/20 69.6 kg (153 lb 7 oz)    ASSESSMENT AND PLAN:  1. CAD: 9/19 NSTEMI with PCI to proximal ramus.  LHC in 7/20 with no obstructive disease.  Admission in 9/21 with Cardiolite showing no ischemia. Recent Admit 3/22 for CP, suspected to be musculoskeletal.  He has long-standing primarily non-exertional chest pain.  I suspect that it may be musculoskeletal and  related to his prior surgeries. No anginal symptomatology. Pain is relieved with acetaminophen use.   - he plans to return to [facility] for f/u  - ? Any benefit from nerve block for chronic pain - Continue ASA 81 + ? blocker. - He is on Repatha via the lipid clinic.  2. 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 Myeloma panel negative and PYP scan negative, suspect due more likely to HTN than hypertrophic cardiomyopathy.  Cannot get cardiac MRI with elevated creatinine.  Echo in 9/21 showed EF up to 60-65%, severe LVH, normal RV. Recent echo 3/22 EF 60-65%, severe LVH, RV normal. normalized again in 05/2020. Echo from 09/03/2020 EF > 75%, grade 1 DD, LV hyperdynamic function; concern for speckled appearance.  Light chains/SPEP/UPEP ordered. Consider repeating PYP. NYHA class II symptoms. Euvolemic on exam. GDMT limited due to ESRD.  - Increase hydralazine to 75 mg tid. - Continue Coreg 25 mg bid.  - Continue Imdur 30 mg daily. - He is off spironolactone, Jardiance and Entresto with ESRD. 3. HTN: Still needs better control.  Sleep study in 10/21 was negative for OSA.  - Amlodipine, minoxidil, and Cardura stopped last admission due to soft BP.  - Increase hydralazine to 75 mg tid. - Continue carvedilol, clonidine, Imdur - He does not have other good options for antihypertensives.  - Renal artery dopplers completed 3/22 to rule out renal artery stenosis and showed mild Rt RAS 1-59%. No Lt sided RAS.  4. S/P Bioprothetic AVR: Echo in 6/22 with increase in mean gradient (33.5 mmHg). Personally discussed with Dr. McLean, will arrange for TEE. - SBE prophylaxis w/ dental work.  5. S/P 2016 and 2018 thoracic aortic aneurysm repair: Followed by Dr. Hughes at [facility].  - lost to f/u. He plans to call to make an appointment. 6. ESRD: New SCr baseline closer to 4 range, nonoliguric.  He is now on iHD M/T/Th/S as of 09/10/20. - He is followed by  [facility]. - BMET today.    7. Active smoker:  Smoking cessation advised, he has been cutting back, down to 3 cigs/day.  Follow up in 6 weeks for APP for BP check and in   3 months with Dr. McLean. Will arrange for TEE to evaluate AVR.   Signed, Reilley Valentine M Gursimran Litaker, FNP -BC 10/02/2020  [facility] [address] [facility] [place] [address] [phone] (office) [phone] (fax) 

## 2020-10-02 ENCOUNTER — Other Ambulatory Visit: Payer: Self-pay

## 2020-10-02 ENCOUNTER — Ambulatory Visit (HOSPITAL_COMMUNITY)
Admission: RE | Admit: 2020-10-02 | Discharge: 2020-10-02 | Disposition: A | Discharge status: Home / Self Care | Payer: Medicare HMO | Source: Ambulatory Visit | Attending: Family Medicine | Admitting: Family Medicine

## 2020-10-02 ENCOUNTER — Encounter (HOSPITAL_COMMUNITY): Payer: Self-pay

## 2020-10-02 VITALS — BP 170/76 | HR 110 | Wt 145.0 lb

## 2020-10-02 DIAGNOSIS — Z953 Presence of xenogenic heart valve: Secondary | ICD-10-CM | POA: Insufficient documentation

## 2020-10-02 DIAGNOSIS — Z7982 Long term (current) use of aspirin: Secondary | ICD-10-CM | POA: Diagnosis not present

## 2020-10-02 DIAGNOSIS — F17210 Nicotine dependence, cigarettes, uncomplicated: Secondary | ICD-10-CM | POA: Diagnosis not present

## 2020-10-02 DIAGNOSIS — N186 End stage renal disease: Secondary | ICD-10-CM | POA: Diagnosis not present

## 2020-10-02 DIAGNOSIS — Z952 Presence of prosthetic heart valve: Secondary | ICD-10-CM | POA: Insufficient documentation

## 2020-10-02 DIAGNOSIS — I2510 Atherosclerotic heart disease of native coronary artery without angina pectoris: Secondary | ICD-10-CM | POA: Diagnosis present

## 2020-10-02 DIAGNOSIS — I5042 Chronic combined systolic (congestive) and diastolic (congestive) heart failure: Secondary | ICD-10-CM | POA: Diagnosis not present

## 2020-10-02 DIAGNOSIS — Z9889 Other specified postprocedural states: Secondary | ICD-10-CM

## 2020-10-02 DIAGNOSIS — I359 Nonrheumatic aortic valve disorder, unspecified: Secondary | ICD-10-CM

## 2020-10-02 DIAGNOSIS — Z8249 Family history of ischemic heart disease and other diseases of the circulatory system: Secondary | ICD-10-CM | POA: Diagnosis not present

## 2020-10-02 DIAGNOSIS — I132 Hypertensive heart and chronic kidney disease with heart failure and with stage 5 chronic kidney disease, or end stage renal disease: Secondary | ICD-10-CM | POA: Insufficient documentation

## 2020-10-02 DIAGNOSIS — Z720 Tobacco use: Secondary | ICD-10-CM

## 2020-10-02 DIAGNOSIS — Z8679 Personal history of other diseases of the circulatory system: Secondary | ICD-10-CM | POA: Insufficient documentation

## 2020-10-02 DIAGNOSIS — Z8673 Personal history of transient ischemic attack (TIA), and cerebral infarction without residual deficits: Secondary | ICD-10-CM | POA: Diagnosis not present

## 2020-10-02 DIAGNOSIS — Z79899 Other long term (current) drug therapy: Secondary | ICD-10-CM | POA: Insufficient documentation

## 2020-10-02 DIAGNOSIS — I10 Essential (primary) hypertension: Secondary | ICD-10-CM

## 2020-10-02 DIAGNOSIS — I701 Atherosclerosis of renal artery: Secondary | ICD-10-CM | POA: Diagnosis not present

## 2020-10-02 DIAGNOSIS — Z992 Dependence on renal dialysis: Secondary | ICD-10-CM | POA: Diagnosis not present

## 2020-10-02 DIAGNOSIS — I5022 Chronic systolic (congestive) heart failure: Secondary | ICD-10-CM | POA: Diagnosis not present

## 2020-10-02 DIAGNOSIS — E785 Hyperlipidemia, unspecified: Secondary | ICD-10-CM | POA: Insufficient documentation

## 2020-10-02 DIAGNOSIS — I252 Old myocardial infarction: Secondary | ICD-10-CM | POA: Insufficient documentation

## 2020-10-02 LAB — BASIC METABOLIC PANEL
Anion gap: 11 (ref 5–15)
BUN: 49 mg/dL — ABNORMAL HIGH (ref 6–20)
CO2: 29 mmol/L (ref 22–32)
Calcium: 9.8 mg/dL (ref 8.9–10.3)
Chloride: 100 mmol/L (ref 98–111)
Creatinine, Ser: 6.5 mg/dL — ABNORMAL HIGH (ref 0.61–1.24)
GFR, Estimated: 10 mL/min — ABNORMAL LOW (ref >60)
Glucose, Bld: 87 mg/dL (ref 70–99)
Potassium: 4.3 mmol/L (ref 3.5–5.1)
Sodium: 140 mmol/L (ref 135–145)

## 2020-10-02 MED ORDER — HYDRALAZINE HCL 50 MG PO TABS: 75.0000 mg | ORAL_TABLET | Freq: Three times a day (TID) | ORAL | 1 refills | Status: DC

## 2020-10-02 NOTE — Patient Instructions (Signed)
INCREASE Hydralazine to 75 mg 9one and one half tba ) three times daily  Labs today We will only contact you if something comes back abnormal or we need to make some changes. Otherwise no news is good news!  Your physician recommends that you schedule a follow-up appointment in: 6 weeks  in the Advanced Practitioners (PA/NP) Clinic and in 4 months with Dr Aundra Dubin  Do the following things EVERYDAY: Weigh yourself in the morning before breakfast. Write it down and keep it in a log. Take your medicines as prescribed Eat low salt foods--Limit salt (sodium) to 2000 mg per day.  Stay as active as you can everyday Limit all fluids for the day to less than 2 liters   milAt the [facility], you and your health needs are our priority. As part of our continuing mission to provide you with exceptional heart care, we have created designated Provider Care Teams. These Care Teams include your primary Cardiologist (physician) and Advanced Practice Providers (APPs- Physician Assistants and Nurse Practitioners) who all work together to provide you with the care you need, when you need it.   You may see any of the following providers on your designated Care Team at your next follow up: Dr Glori Bickers Dr [name] Dr Patrice Paradise, NP Lyda Jester, [place] Ginnie Smart Audry Riles, PharmD   Please be sure to bring in all your medications bottles to every appointment.

## 2020-10-03 ENCOUNTER — Telehealth (HOSPITAL_COMMUNITY): Payer: Self-pay | Admitting: Family Medicine

## 2020-10-03 NOTE — Telephone Encounter (Signed)
Attempted to call patient and discuss recommendation from Dr. Aundra Dubin to complete TEE to evaluate his AVR. LMTCB.  Allena Katz, FNP-BC

## 2020-10-08 ENCOUNTER — Ambulatory Visit (INDEPENDENT_AMBULATORY_CARE_PROVIDER_SITE_OTHER): Payer: Medicare HMO | Admitting: Vascular Surgery

## 2020-10-08 ENCOUNTER — Ambulatory Visit (HOSPITAL_COMMUNITY)
Admission: RE | Admit: 2020-10-08 | Discharge: 2020-10-08 | Disposition: A | Discharge status: Home / Self Care | Payer: Medicare HMO | Source: Ambulatory Visit | Attending: Vascular Surgery | Admitting: Vascular Surgery

## 2020-10-08 ENCOUNTER — Other Ambulatory Visit: Payer: Self-pay

## 2020-10-08 ENCOUNTER — Encounter: Payer: Self-pay | Admitting: Vascular Surgery

## 2020-10-08 ENCOUNTER — Encounter (HOSPITAL_COMMUNITY): Payer: Self-pay | Admitting: Vascular Surgery

## 2020-10-08 ENCOUNTER — Telehealth (HOSPITAL_COMMUNITY): Payer: Self-pay | Admitting: Cardiology

## 2020-10-08 VITALS — BP 159/92 | HR 95 | Temp 97.9°F | Resp 20 | Ht 71.0 in | Wt 146.0 lb

## 2020-10-08 DIAGNOSIS — Z992 Dependence on renal dialysis: Secondary | ICD-10-CM | POA: Insufficient documentation

## 2020-10-08 DIAGNOSIS — N186 End stage renal disease: Secondary | ICD-10-CM | POA: Diagnosis present

## 2020-10-08 NOTE — Telephone Encounter (Signed)
Pt aware of instructions and voiced understanding 

## 2020-10-08 NOTE — Progress Notes (Signed)
[facility] [place] PROGRESS NOTE  ASSESSMENT / PLAN: [name] is a 50 y.o. male with nearly resolved hematoma after first stage basilic vein transposition. Duplex today reveals matured fistula ready for second stage transposition. We will hopefully do this in the OR tomorrow.  SUBJECTIVE: Left arm improved. No problem in the hand.  OBJECTIVE: BP (!) 159/92 (BP Location: Right Arm, Patient Position: Sitting, Cuff Size: Normal)   Pulse 95   Temp 97.9 F (36.6 C)   Resp 20   Ht 5\' 11"  (2.263 m)   Wt 146 lb (66.2 kg)   SpO2 99%   BMI 20.36 kg/m   Moderate (2-3cm) incisional hematoma in left medial arm Good thrill in fistla 2+ radial pulse  CBC Latest Ref Rng & Units 09/16/2020 09/10/2020 09/10/2020  WBC 4.0 - 10.5 K/uL 4.8 4.9 5.0  Hemoglobin 13.0 - 17.0 g/dL 8.1(L) 8.1(L) 8.1(L)  Hematocrit 39.0 - 52.0 % 25.5(L) 23.5(L) 24.1(L)  Platelets 150 - 400 K/uL 87(L) 72(L) 75(L)     CMP Latest Ref Rng & Units 10/02/2020 09/16/2020 09/11/2020  Glucose 70 - 99 mg/dL 87 107(H) 114(H)  BUN 6 - 20 mg/dL 49(H) 25(H) 57(H)  Creatinine 0.61 - 1.24 mg/dL 6.50(H) 4.39(H) 7.68(H)  Sodium 135 - 145 mmol/L 140 139 133(L)  Potassium 3.5 - 5.1 mmol/L 4.3 4.1 4.1  Chloride 98 - 111 mmol/L 100 101 101  CO2 22 - 32 mmol/L 29 32 24  Calcium 8.9 - 10.3 mg/dL 9.8 8.3(L) 8.8(L)  Total Protein 6.5 - 8.1 g/dL - - -  Total Bilirubin 0.3 - 1.2 mg/dL - - -  Alkaline Phos 38 - 126 U/L - - -  AST 15 - 41 U/L - - -  ALT 0 - 44 U/L - - -    Estimated Creatinine Clearance: 13 mL/min (A) (by C-G formula based on SCr of 6.5 mg/dL (H)).  [name]. Stanford Breed, MD [facility] Phone Number: [phone] 10/08/2020 4:16 PM

## 2020-10-08 NOTE — Telephone Encounter (Signed)
    You are scheduled for a TEE/Cardioversion/TEE Cardioversion on 10/16/20 with Dr. Aundra Dubin.  Please arrive at the [facility] (Main Entrance A) at [facility]: [address] [place], [place] [address] at 830am (1 hour prior to procedure unless lab work is needed; if lab work is needed arrive 1.5 hours ahead)  DIET: Nothing to eat or drink after midnight except a sip of water with medications (see medication instructions below)  Medication Instructions: none       You must have a responsible person to drive you home and stay in the waiting area during your procedure. Failure to do so could result in cancellation.  Bring your insurance cards.  *Special Note: Every effort is made to have your procedure done on time. Occasionally there are emergencies that occur at the hospital that may cause delays. Please be patient if a delay does occur.

## 2020-10-08 NOTE — Progress Notes (Signed)
DUE TO COVID-19 ONLY ONE VISITOR IS ALLOWED TO COME WITH YOU AND STAY IN THE WAITING ROOM ONLY DURING PRE OP AND PROCEDURE DAY OF SURGERY.   PCP - Juluis Mire, NP Cardiologist - Dr [name] [facility] - Dr Lucio Edward  Chest x-ray - 09/09/20 (1V) EKG - 10/02/20 Stress Test - 11/25/19 ECHO - 09/03/20 Cardiac Cath - 09/26/18  Loop Recorder - Yes, 06/19/16.  Last device check on Loop recorder was 01/26/20.  Sleep Study -  n/a CPAP - none  Anesthesia review: Yes-Ok per Dr Fransisco Beau via telephone.  Reviewed medical hx, no current changes since last surgery on 08/2020.  STOP now taking any Aspirin (unless otherwise instructed by your surgeon), Aleve, Naproxen, Ibuprofen, Motrin, Advil, Goody's, BC's, all herbal medications, fish oil, and all vitamins.   Coronavirus Screening Covid test n/a - Ambulatory Surgery  Do you have any of the following symptoms:  Cough yes/no: No Fever (>100.76F)  yes/no: No Runny nose yes/no: No Sore throat yes/no: No Difficulty breathing/shortness of breath  yes/no: No  Have you traveled in the last 14 days and where? yes/no: No  Patient verbalized understanding of instructions that were given via phone.

## 2020-10-08 NOTE — H&P (View-Only) (Signed)
[facility] [place] PROGRESS NOTE  ASSESSMENT / PLAN: [name] is a 50 y.o. male with nearly resolved hematoma after first stage basilic vein transposition. Duplex today reveals matured fistula ready for second stage transposition. We will hopefully do this in the OR tomorrow.  SUBJECTIVE: Left arm improved. No problem in the hand.  OBJECTIVE: BP (!) 159/92 (BP Location: Right Arm, Patient Position: Sitting, Cuff Size: Normal)   Pulse 95   Temp 97.9 F (36.6 C)   Resp 20   Ht 5\' 11"  (1.803 m)   Wt 146 lb (66.2 kg)   SpO2 99%   BMI 20.36 kg/m   Moderate (2-3cm) incisional hematoma in left medial arm Good thrill in fistla 2+ radial pulse  CBC Latest Ref Rng & Units 09/16/2020 09/10/2020 09/10/2020  WBC 4.0 - 10.5 K/uL 4.8 4.9 5.0  Hemoglobin 13.0 - 17.0 g/dL 8.1(L) 8.1(L) 8.1(L)  Hematocrit 39.0 - 52.0 % 25.5(L) 23.5(L) 24.1(L)  Platelets 150 - 400 K/uL 87(L) 72(L) 75(L)     CMP Latest Ref Rng & Units 10/02/2020 09/16/2020 09/11/2020  Glucose 70 - 99 mg/dL 87 107(H) 114(H)  BUN 6 - 20 mg/dL 49(H) 25(H) 57(H)  Creatinine 0.61 - 1.24 mg/dL 6.50(H) 4.39(H) 7.68(H)  Sodium 135 - 145 mmol/L 140 139 133(L)  Potassium 3.5 - 5.1 mmol/L 4.3 4.1 4.1  Chloride 98 - 111 mmol/L 100 101 101  CO2 22 - 32 mmol/L 29 32 24  Calcium 8.9 - 10.3 mg/dL 9.8 8.3(L) 8.8(L)  Total Protein 6.5 - 8.1 g/dL - - -  Total Bilirubin 0.3 - 1.2 mg/dL - - -  Alkaline Phos 38 - 126 U/L - - -  AST 15 - 41 U/L - - -  ALT 0 - 44 U/L - - -    Estimated Creatinine Clearance: 13 mL/min (A) (by C-G formula based on SCr of 6.5 mg/dL (H)).  [name]. Andre Breed, MD [facility] Phone Number: [phone] 10/08/2020 4:16 PM

## 2020-10-09 ENCOUNTER — Encounter (HOSPITAL_COMMUNITY): Payer: Self-pay | Admitting: Vascular Surgery

## 2020-10-09 ENCOUNTER — Other Ambulatory Visit (HOSPITAL_COMMUNITY): Payer: Self-pay

## 2020-10-09 ENCOUNTER — Ambulatory Visit (HOSPITAL_COMMUNITY): Payer: Medicare HMO | Admitting: Anesthesiology

## 2020-10-09 ENCOUNTER — Other Ambulatory Visit: Payer: Self-pay

## 2020-10-09 ENCOUNTER — Ambulatory Visit (HOSPITAL_COMMUNITY)
Admission: RE | Admit: 2020-10-09 | Discharge: 2020-10-09 | Disposition: A | Discharge status: Home / Self Care | Payer: Medicare HMO | Attending: Vascular Surgery | Admitting: Vascular Surgery

## 2020-10-09 ENCOUNTER — Encounter (HOSPITAL_COMMUNITY)
Admission: RE | Disposition: A | Discharge status: Home / Self Care | Payer: Self-pay | Source: Home / Self Care | Attending: Vascular Surgery

## 2020-10-09 DIAGNOSIS — Z452 Encounter for adjustment and management of vascular access device: Secondary | ICD-10-CM | POA: Insufficient documentation

## 2020-10-09 DIAGNOSIS — I132 Hypertensive heart and chronic kidney disease with heart failure and with stage 5 chronic kidney disease, or end stage renal disease: Secondary | ICD-10-CM | POA: Insufficient documentation

## 2020-10-09 DIAGNOSIS — F17200 Nicotine dependence, unspecified, uncomplicated: Secondary | ICD-10-CM | POA: Diagnosis not present

## 2020-10-09 DIAGNOSIS — N185 Chronic kidney disease, stage 5: Secondary | ICD-10-CM

## 2020-10-09 DIAGNOSIS — I5022 Chronic systolic (congestive) heart failure: Secondary | ICD-10-CM

## 2020-10-09 DIAGNOSIS — I120 Hypertensive chronic kidney disease with stage 5 chronic kidney disease or end stage renal disease: Secondary | ICD-10-CM | POA: Diagnosis not present

## 2020-10-09 DIAGNOSIS — N186 End stage renal disease: Secondary | ICD-10-CM | POA: Diagnosis not present

## 2020-10-09 DIAGNOSIS — I509 Heart failure, unspecified: Secondary | ICD-10-CM | POA: Diagnosis not present

## 2020-10-09 HISTORY — PX: BASCILIC VEIN TRANSPOSITION: SHX5742

## 2020-10-09 LAB — POCT I-STAT, CHEM 8
BUN: 47 mg/dL — ABNORMAL HIGH (ref 6–20)
Calcium, Ion: 1.15 mmol/L (ref 1.15–1.40)
Chloride: 101 mmol/L (ref 98–111)
Creatinine, Ser: 7.5 mg/dL — ABNORMAL HIGH (ref 0.61–1.24)
Glucose, Bld: 98 mg/dL (ref 70–99)
HCT: 34 % — ABNORMAL LOW (ref 39.0–52.0)
Hemoglobin: 11.6 g/dL — ABNORMAL LOW (ref 13.0–17.0)
Potassium: 3.8 mmol/L (ref 3.5–5.1)
Sodium: 136 mmol/L (ref 135–145)
TCO2: 25 mmol/L (ref 22–32)

## 2020-10-09 SURGERY — TRANSPOSITION, VEIN, BASILIC
Anesthesia: Monitor Anesthesia Care | Site: Arm Upper | Laterality: Left

## 2020-10-09 MED ORDER — MIDAZOLAM HCL 2 MG/2ML IJ SOLN
INTRAMUSCULAR | Status: AC
  Filled 2020-10-09: qty 2

## 2020-10-09 MED ORDER — FENTANYL CITRATE (PF) 100 MCG/2ML IJ SOLN
INTRAMUSCULAR | Status: DC | PRN
  Administered 2020-10-09 (×2): 50 ug via INTRAVENOUS

## 2020-10-09 MED ORDER — CHLORHEXIDINE GLUCONATE 0.12 % MT SOLN
15.0000 mL | Freq: Once | OROMUCOSAL | Status: AC
  Administered 2020-10-09: 15 mL via OROMUCOSAL
  Filled 2020-10-09: qty 15

## 2020-10-09 MED ORDER — PHENYLEPHRINE 40 MCG/ML (10ML) SYRINGE FOR IV PUSH (FOR BLOOD PRESSURE SUPPORT)
PREFILLED_SYRINGE | INTRAVENOUS | Status: AC
  Filled 2020-10-09: qty 10

## 2020-10-09 MED ORDER — LABETALOL HCL 5 MG/ML IV SOLN
INTRAVENOUS | Status: AC
  Filled 2020-10-09: qty 4

## 2020-10-09 MED ORDER — FENTANYL CITRATE (PF) 250 MCG/5ML IJ SOLN
INTRAMUSCULAR | Status: AC
  Filled 2020-10-09: qty 5

## 2020-10-09 MED ORDER — FENTANYL CITRATE (PF) 100 MCG/2ML IJ SOLN: 25.0000 ug | INTRAMUSCULAR | Status: DC | PRN

## 2020-10-09 MED ORDER — LIDOCAINE-EPINEPHRINE (PF) 1.5 %-1:200000 IJ SOLN
INTRAMUSCULAR | Status: DC | PRN
  Administered 2020-10-09: 30 mL via PERINEURAL

## 2020-10-09 MED ORDER — MIDAZOLAM HCL 5 MG/5ML IJ SOLN
INTRAMUSCULAR | Status: DC | PRN
  Administered 2020-10-09: 1 mg via INTRAVENOUS

## 2020-10-09 MED ORDER — LIDOCAINE-EPINEPHRINE 1 %-1:100000 IJ SOLN
INTRAMUSCULAR | Status: AC
  Filled 2020-10-09: qty 1

## 2020-10-09 MED ORDER — CHLORHEXIDINE GLUCONATE 4 % EX LIQD: 60.0000 mL | Freq: Once | CUTANEOUS | Status: DC

## 2020-10-09 MED ORDER — LIDOCAINE 2% (20 MG/ML) 5 ML SYRINGE
INTRAMUSCULAR | Status: DC | PRN
  Administered 2020-10-09: 60 mg via INTRAVENOUS

## 2020-10-09 MED ORDER — PROMETHAZINE HCL 25 MG/ML IJ SOLN: 6.2500 mg | INTRAMUSCULAR | Status: DC | PRN

## 2020-10-09 MED ORDER — MIDAZOLAM HCL 2 MG/2ML IJ SOLN: 2.0000 mg | Freq: Once | INTRAMUSCULAR | Status: AC

## 2020-10-09 MED ORDER — LABETALOL HCL 5 MG/ML IV SOLN
5.0000 mg | Freq: Once | INTRAVENOUS | Status: AC
  Administered 2020-10-09: 5 mg via INTRAVENOUS

## 2020-10-09 MED ORDER — OXYCODONE HCL 5 MG PO TABS
5.0000 mg | ORAL_TABLET | Freq: Once | ORAL | Status: DC | PRN
Start: 2020-10-09 — End: 2020-10-09

## 2020-10-09 MED ORDER — MIDAZOLAM HCL 2 MG/2ML IJ SOLN
INTRAMUSCULAR | Status: AC
  Administered 2020-10-09: 2 mg via INTRAVENOUS
  Filled 2020-10-09: qty 2

## 2020-10-09 MED ORDER — PROPOFOL 10 MG/ML IV BOLUS
INTRAVENOUS | Status: DC | PRN
  Administered 2020-10-09 (×2): 20 mg via INTRAVENOUS

## 2020-10-09 MED ORDER — HEPARIN 6000 UNIT IRRIGATION SOLUTION
Status: AC
  Filled 2020-10-09: qty 500

## 2020-10-09 MED ORDER — HEPARIN 6000 UNIT IRRIGATION SOLUTION
Status: DC | PRN
  Administered 2020-10-09: 1

## 2020-10-09 MED ORDER — PROPOFOL 500 MG/50ML IV EMUL
INTRAVENOUS | Status: DC | PRN
  Administered 2020-10-09: 75 ug/kg/min via INTRAVENOUS

## 2020-10-09 MED ORDER — ACETAMINOPHEN 10 MG/ML IV SOLN: 1000.0000 mg | Freq: Once | INTRAVENOUS | Status: DC | PRN

## 2020-10-09 MED ORDER — SODIUM CHLORIDE 0.9 % IV SOLN: INTRAVENOUS | Status: DC

## 2020-10-09 MED ORDER — OXYCODONE-ACETAMINOPHEN 5-325 MG PO TABS: 1.0000 | ORAL_TABLET | Freq: Four times a day (QID) | ORAL | 0 refills | Status: DC | PRN

## 2020-10-09 MED ORDER — LIDOCAINE HCL (PF) 1 % IJ SOLN
INTRAMUSCULAR | Status: AC
  Filled 2020-10-09: qty 30

## 2020-10-09 MED ORDER — PHENYLEPHRINE 40 MCG/ML (10ML) SYRINGE FOR IV PUSH (FOR BLOOD PRESSURE SUPPORT)
PREFILLED_SYRINGE | INTRAVENOUS | Status: DC | PRN
  Administered 2020-10-09: 80 ug via INTRAVENOUS

## 2020-10-09 MED ORDER — CEFAZOLIN SODIUM-DEXTROSE 2-4 GM/100ML-% IV SOLN
2.0000 g | INTRAVENOUS | Status: AC
  Administered 2020-10-09: 2 g via INTRAVENOUS
  Filled 2020-10-09: qty 100

## 2020-10-09 MED ORDER — LIDOCAINE 2% (20 MG/ML) 5 ML SYRINGE
INTRAMUSCULAR | Status: AC
  Filled 2020-10-09: qty 5

## 2020-10-09 MED ORDER — 0.9 % SODIUM CHLORIDE (POUR BTL) OPTIME
TOPICAL | Status: DC | PRN
  Administered 2020-10-09: 1000 mL

## 2020-10-09 MED ORDER — OXYCODONE HCL 5 MG/5ML PO SOLN: 5.0000 mg | Freq: Once | ORAL | Status: DC | PRN

## 2020-10-09 MED ORDER — FENTANYL CITRATE (PF) 100 MCG/2ML IJ SOLN
INTRAMUSCULAR | Status: AC
  Filled 2020-10-09: qty 2

## 2020-10-09 MED ORDER — ORAL CARE MOUTH RINSE: 15.0000 mL | Freq: Once | OROMUCOSAL | Status: AC

## 2020-10-09 SURGICAL SUPPLY — 47 items
APL PRP STRL LF DISP 70% ISPRP (MISCELLANEOUS) ×1
APL SKNCLS STERI-STRIP NONHPOA (GAUZE/BANDAGES/DRESSINGS) ×1
ARMBAND PINK RESTRICT EXTREMIT (MISCELLANEOUS) ×2 IMPLANT
BENZOIN TINCTURE PRP APPL 2/3 (GAUZE/BANDAGES/DRESSINGS) ×2 IMPLANT
BNDG ELASTIC 4X5.8 VLCR STR LF (GAUZE/BANDAGES/DRESSINGS) ×1 IMPLANT
BNDG ELASTIC 6X5.8 VLCR STR LF (GAUZE/BANDAGES/DRESSINGS) ×1 IMPLANT
BNDG GAUZE ELAST 4 BULKY (GAUZE/BANDAGES/DRESSINGS) ×1 IMPLANT
CANISTER SUCT 3000ML PPV (MISCELLANEOUS) ×2 IMPLANT
CANNULA VESSEL 3MM 2 BLNT TIP (CANNULA) ×2 IMPLANT
CHLORAPREP W/TINT 26 (MISCELLANEOUS) ×2 IMPLANT
CLIP VESOCCLUDE MED 6/CT (CLIP) ×2 IMPLANT
CLIP VESOCCLUDE SM WIDE 6/CT (CLIP) ×2 IMPLANT
CLSR STERI-STRIP ANTIMIC 1/2X4 (GAUZE/BANDAGES/DRESSINGS) ×1 IMPLANT
COVER PROBE W GEL 5X96 (DRAPES) IMPLANT
DRAPE EXTREMITY T 121X128X90 (DISPOSABLE) ×2 IMPLANT
DRSG COVADERM 4X6 (GAUZE/BANDAGES/DRESSINGS) ×2 IMPLANT
ELECT REM PT RETURN 9FT ADLT (ELECTROSURGICAL) ×2
ELECTRODE REM PT RTRN 9FT ADLT (ELECTROSURGICAL) ×1 IMPLANT
GAUZE 4X4 16PLY ~~LOC~~+RFID DBL (SPONGE) ×2 IMPLANT
GAUZE SPONGE 4X4 12PLY STRL (GAUZE/BANDAGES/DRESSINGS) ×1 IMPLANT
GLOVE SURG MICRO LTX SZ6 (GLOVE) ×1 IMPLANT
GLOVE SURG MICRO LTX SZ6.5 (GLOVE) ×2 IMPLANT
GLOVE SURG POLYISO LF SZ8 (GLOVE) ×2 IMPLANT
GLOVE SURG UNDER POLY LF SZ6.5 (GLOVE) ×1 IMPLANT
GOWN STRL REUS W/ TWL LRG LVL3 (GOWN DISPOSABLE) ×2 IMPLANT
GOWN STRL REUS W/ TWL XL LVL3 (GOWN DISPOSABLE) ×1 IMPLANT
GOWN STRL REUS W/TWL LRG LVL3 (GOWN DISPOSABLE) ×6
GOWN STRL REUS W/TWL XL LVL3 (GOWN DISPOSABLE) ×2
INSERT FOGARTY SM (MISCELLANEOUS) IMPLANT
KIT BASIN OR (CUSTOM PROCEDURE TRAY) ×2 IMPLANT
KIT TURNOVER KIT B (KITS) ×2 IMPLANT
NDL 18GX1X1/2 (RX/OR ONLY) (NEEDLE) IMPLANT
NEEDLE 18GX1X1/2 (RX/OR ONLY) (NEEDLE) IMPLANT
NS IRRIG 1000ML POUR BTL (IV SOLUTION) ×2 IMPLANT
PACK CV ACCESS (CUSTOM PROCEDURE TRAY) ×2 IMPLANT
PAD ARMBOARD 7.5X6 YLW CONV (MISCELLANEOUS) ×4 IMPLANT
SPONGE T-LAP 18X18 ~~LOC~~+RFID (SPONGE) ×1 IMPLANT
STRIP CLOSURE SKIN 1/2X4 (GAUZE/BANDAGES/DRESSINGS) ×2 IMPLANT
SUT MNCRL AB 4-0 PS2 18 (SUTURE) ×3 IMPLANT
SUT PROLENE 6 0 BV (SUTURE) ×4 IMPLANT
SUT SILK 2 0 SH (SUTURE) ×1 IMPLANT
SUT VIC AB 3-0 SH 27 (SUTURE) ×4
SUT VIC AB 3-0 SH 27X BRD (SUTURE) ×1 IMPLANT
SYR 3ML LL SCALE MARK (SYRINGE) IMPLANT
TOWEL GREEN STERILE (TOWEL DISPOSABLE) ×2 IMPLANT
UNDERPAD 30X36 HEAVY ABSORB (UNDERPADS AND DIAPERS) ×2 IMPLANT
WATER STERILE IRR 1000ML POUR (IV SOLUTION) ×2 IMPLANT

## 2020-10-09 NOTE — Op Note (Signed)
DATE OF SERVICE: 10/09/2020  PATIENT:  [name]  50 y.o. male  PRE-OPERATIVE DIAGNOSIS:  ESRD  POST-OPERATIVE DIAGNOSIS:  Same  PROCEDURE:   Left second stage brachiobasilic transposition  SURGEON:  Surgeon(s) and Role:    * Cherre Robins, MD - Primary  ASSISTANT: Gerri Lins, PA-C  An assistant was required to facilitate exposure and expedite the case.  ANESTHESIA:   regional and MAC  EBL: 151m  BLOOD ADMINISTERED:none  DRAINS: none   LOCAL MEDICATIONS USED:  NONE  SPECIMEN:  none  COUNTS: confirmed correct .  TOURNIQUET:  none  PATIENT DISPOSITION:  PACU - hemodynamically stable.   Delay start of Pharmacological VTE agent (>24hrs) due to surgical blood loss or risk of bleeding: no  INDICATION FOR PROCEDURE: [name] a 50y.o. male with ESRD. I created a first stage brachiobasilic arteriovenous fistula for him 09/09/20. After careful discussion of risks, benefits, and alternatives the patient was offered transposition. The patient understood and wished to proceed.  OPERATIVE FINDINGS: healthy fistula. Basilic fistula enters the fascia very close to the elbow. Fistula drained into brachial vein for most of course of arm. This was skeletonized and transposed in standard fashion. Good thrill upon completion. 2+ radial pulse on completion.  DESCRIPTION OF PROCEDURE: After identification of the patient in the pre-operative holding area, the patient was transferred to the operating room. The patient was positioned supine on the operating room table. Anesthesia was induced. The left arm was prepped and draped in standard fashion. A surgical pause was performed confirming correct patient, procedure, and operative location.  Using intraoperative ultrasound the course of the basilic vein was marked on the skin.  3 skip incisions were made over the course of the basilic vein fistula.  These were carried down through subcutaneous tissue until the fistula was  encountered.  The fascia was skeletonized from the axilla to the anastomosis, taking care to ligate and divide sidebranches, and to protect the medial antebrachial cutaneous nerve.   A subcutaneous tunnel was created over the biceps using a sheath tunneling device.  Patient was heparinized.  The fistula near the anastomosis was clamped.  The outflow in the axilla was clamped.  The fistula was divided.  The fistula was marked to ensure no twisting or kinking while delivering the fistula through the tunnel.  The fistula was tunneled through the arm and delivered near the previous anastomosis.  The fistula was spatulated proximally and distally.  The fistula was reanastomosed end-to-end using continuous running suture of 5-0 Prolene.  A palpable thrill was felt over the subcutaneous course of the tunnel.  Doppler flow was excellent in the proximal and distal fistula. There was a 2+ radial pulse.  The wounds were copiously irrigated.  Hemostasis was ensured in the surgical bed.  The wounds were closed in layers using 3-0 Vicryl and 4-0 Monocryl.  Upon completion of the case instrument and sharps counts were confirmed correct. The patient was transferred to the PACU in good condition. I was present for all portions of the procedure.  TYevonne Aline HStanford Breed MD [facility]Phone Number: ([phone]/27/2022 12:58 PM

## 2020-10-09 NOTE — Transfer of Care (Signed)
Immediate Anesthesia Transfer of Care Note  Patient: [name]  Procedure(s) Performed: LEFT SECOND STAGE BASILIC VEIN TRANSPOSITION (Left: Arm Upper)  Patient Location: PACU  Anesthesia Type:MAC and Regional  Level of Consciousness: awake  Airway & Oxygen Therapy: Patient Spontanous Breathing and Patient connected to face mask oxygen  Post-op Assessment: Report given to RN and Post -op Vital signs reviewed and stable  Post vital signs: Reviewed and stable  Last Vitals:  Vitals Value Taken Time  BP    Temp    Pulse 80 10/09/20 1309  Resp 18 10/09/20 1308  SpO2 99 % 10/09/20 1309  Vitals shown include unvalidated device data.  Last Pain:  Vitals:   10/09/20 0948  TempSrc:   PainSc: 0-No pain         Complications: No notable events documented.

## 2020-10-09 NOTE — Interval H&P Note (Signed)
History and Physical Interval Note:  10/09/2020 10:18 AM  [name]  has presented today for surgery, with the diagnosis of ESRD.  The various methods of treatment have been discussed with the patient and family. After consideration of risks, benefits and other options for treatment, the patient has consented to  Procedure(s) with comments: LEFT SECOND STAGE BASILIC VEIN TRANSPOSITION (Left) - PERIPHERAL NERVE BLOCK as a surgical intervention.  The patient's history has been reviewed, patient examined, no change in status, stable for surgery.  I have reviewed the patient's chart and labs.  Questions were answered to the patient's satisfaction.     Cherre Robins

## 2020-10-09 NOTE — Anesthesia Procedure Notes (Addendum)
Anesthesia Regional Block: Supraclavicular block   Pre-Anesthetic Checklist: , timeout performed,  Correct Patient, Correct Site, Correct Laterality,  Correct Procedure, Correct Position, site marked,  Risks and benefits discussed,  Surgical consent,  Pre-op evaluation,  At surgeon's request and post-op pain management  Laterality: Left  Prep: chloraprep       Needles:  Injection technique: Single-shot  Needle Type: Echogenic Stimulator Needle     Needle Length: 9cm  Needle Gauge: 21     Additional Needles:   Procedures:,,,, ultrasound used (permanent image in chart),,    Narrative:  Start time: 10/09/2020 10:30 AM End time: 10/09/2020 10:40 AM Injection made incrementally with aspirations every 5 mL.  Performed by: Personally  Anesthesiologist: Murvin Natal, MD  Additional Notes: Functioning IV was confirmed and monitors were applied.  A timeout was performed. Sterile prep, hand hygiene and sterile gloves were used. A 37mm 21ga Arrow echogenic stimulator needle was used. Negative aspiration and negative test dose prior to incremental administration of local anesthetic. The patient tolerated the procedure well.  Ultrasound guidance: relevent anatomy identified, needle position confirmed, local anesthetic spread visualized around nerve(s), vascular puncture avoided.  Image printed for medical record.

## 2020-10-09 NOTE — Anesthesia Procedure Notes (Signed)
Procedure Name: MAC Date/Time: 10/09/2020 11:08 AM Performed by: Lieutenant Diego, CRNA Pre-anesthesia Checklist: Patient identified, Emergency Drugs available, Suction available, Patient being monitored and Timeout performed Patient Re-evaluated:Patient Re-evaluated prior to induction Oxygen Delivery Method: Simple face mask Preoxygenation: Pre-oxygenation with 100% oxygen Induction Type: IV induction

## 2020-10-09 NOTE — Anesthesia Postprocedure Evaluation (Signed)
Anesthesia Post Note  Patient: [name]  Procedure(s) Performed: LEFT SECOND STAGE BASILIC VEIN TRANSPOSITION (Left: Arm Upper)     Patient location during evaluation: PACU Anesthesia Type: Regional Level of consciousness: awake Pain management: pain level controlled Vital Signs Assessment: post-procedure vital signs reviewed and stable Respiratory status: spontaneous breathing, nonlabored ventilation, respiratory function stable and patient connected to nasal cannula oxygen Cardiovascular status: stable and blood pressure returned to baseline Postop Assessment: no apparent nausea or vomiting Anesthetic complications: no   No notable events documented.  Last Vitals:  Vitals:   10/09/20 1335 10/09/20 1350  BP: (!) 156/96   Pulse: 82   Resp: 12   Temp:  36.7 C  SpO2: 98%     Last Pain:  Vitals:   10/09/20 1350  TempSrc:   PainSc: Asleep                 Kelisha Dall P Vickie Melnik

## 2020-10-09 NOTE — Anesthesia Preprocedure Evaluation (Addendum)
Anesthesia Evaluation  Patient identified by MRN, date of birth, ID band Patient awake    Reviewed: Allergy & Precautions, NPO status , Patient's Chart, lab work & pertinent test results  Airway Mallampati: III  TM Distance: >3 FB Neck ROM: Full    Dental   Pulmonary COPD, Current Smoker and Patient abstained from smoking.,    Pulmonary exam normal breath sounds clear to auscultation       Cardiovascular hypertension, Pt. on home beta blockers and Pt. on medications + CAD, + Past MI and +CHF  Normal cardiovascular exam Rhythm:Regular Rate:Normal  ECG: ST   Neuro/Psych  Headaches, Seizures -,  PSYCHIATRIC DISORDERS Anxiety Depression CVA    GI/Hepatic Neg liver ROS, GERD  Medicated and Controlled,  Endo/Other  negative endocrine ROS  Renal/GU ESRF and DialysisRenal disease     Musculoskeletal  (+) Arthritis ,   Abdominal   Peds  Hematology negative hematology ROS (+)   Anesthesia Other Findings ESRD  Reproductive/Obstetrics                            Anesthesia Physical Anesthesia Plan  ASA: 3  Anesthesia Plan: Regional   Post-op Pain Management:    Induction: Intravenous  PONV Risk Score and Plan: 0 and Propofol infusion and Treatment may vary due to age or medical condition  Airway Management Planned: Simple Face Mask  Additional Equipment:   Intra-op Plan:   Post-operative Plan:   Informed Consent: I have reviewed the patients History and Physical, chart, labs and discussed the procedure including the risks, benefits and alternatives for the proposed anesthesia with the patient or authorized representative who has indicated his/her understanding and acceptance.       Plan Discussed with: CRNA  Anesthesia Plan Comments:         Anesthesia Quick Evaluation

## 2020-10-09 NOTE — Discharge Instructions (Signed)
° °  [facility] ° °Discharge Instructions ° °AV Fistula or Graft Surgery for Dialysis Access ° °Please refer to the following instructions for your post-procedure care. Your surgeon or physician assistant will discuss any changes with you. ° °Activity ° °You may drive the day following your surgery, if you are comfortable and no longer taking prescription pain medication. Resume full activity as the soreness in your incision resolves. ° °Bathing/Showering ° °You may shower after you go home. Keep your incision dry for 48 hours. Do not soak in a bathtub, hot tub, or swim until the incision heals completely. You may not shower if you have a hemodialysis catheter. ° °Incision Care ° °Clean your incision with mild soap and water after 48 hours. Pat the area dry with a clean towel. You do not need a bandage unless otherwise instructed. Do not apply any ointments or creams to your incision. You may have skin glue on your incision. Do not peel it off. It will come off on its own in about one week. Your arm may swell a bit after surgery. To reduce swelling use pillows to elevate your arm so it is above your heart. Your doctor will tell you if you need to lightly wrap your arm with an ACE bandage. ° °Diet ° °Resume your normal diet. There are not special food restrictions following this procedure. In order to heal from your surgery, it is CRITICAL to get adequate nutrition. Your body requires vitamins, minerals, and protein. Vegetables are the best source of vitamins and minerals. Vegetables also provide the perfect balance of protein. Processed food has little nutritional value, so try to avoid this. ° °Medications ° °Resume taking all of your medications. If your incision is causing pain, you may take over-the counter pain relievers such as acetaminophen (Tylenol). If you were prescribed a stronger pain medication, please be aware these medications can cause nausea and constipation. Prevent  nausea by taking the medication with a snack or meal. Avoid constipation by drinking plenty of fluids and eating foods with high amount of fiber, such as fruits, vegetables, and grains. Do not take Tylenol if you are taking prescription pain medications. ° ° ° ° °Follow up °Your surgeon may want to see you in the office following your access surgery. If so, this will be arranged at the time of your surgery. ° °Please call us immediately for any of the following conditions: ° °Increased pain, redness, drainage (pus) from your incision site °Fever of 101 degrees or higher °Severe or worsening pain at your incision site °Hand pain or numbness. ° °Reduce your risk of vascular disease: ° °Stop smoking. If you would like help, call [facility] at 1-800-QUIT-NOW (1-800-784-8669) or [place] at 336-586-4000 ° °Manage your cholesterol °Maintain a desired weight °Control your diabetes °Keep your blood pressure down ° °Dialysis ° °It will take several weeks to several months for your new dialysis access to be ready for use. Your surgeon will determine when it is OK to use it. Your nephrologist will continue to direct your dialysis. You can continue to use your Permcath until your new access is ready for use. ° °If you have any questions, please call the office at [phone]. ° °

## 2020-10-10 ENCOUNTER — Encounter (HOSPITAL_COMMUNITY): Payer: Self-pay | Admitting: Vascular Surgery

## 2020-10-16 ENCOUNTER — Ambulatory Visit (HOSPITAL_BASED_OUTPATIENT_CLINIC_OR_DEPARTMENT_OTHER): Payer: Medicare Other

## 2020-10-16 ENCOUNTER — Encounter (HOSPITAL_COMMUNITY)
Admission: RE | Disposition: A | Discharge status: Home / Self Care | Payer: Self-pay | Source: Home / Self Care | Attending: Cardiology

## 2020-10-16 ENCOUNTER — Encounter (HOSPITAL_COMMUNITY): Payer: Self-pay | Admitting: Cardiology

## 2020-10-16 ENCOUNTER — Other Ambulatory Visit: Payer: Self-pay

## 2020-10-16 ENCOUNTER — Ambulatory Visit (HOSPITAL_COMMUNITY)
Admission: RE | Admit: 2020-10-16 | Discharge: 2020-10-16 | Disposition: A | Discharge status: Home / Self Care | Payer: Medicare Other | Attending: Cardiology | Admitting: Cardiology

## 2020-10-16 ENCOUNTER — Ambulatory Visit (HOSPITAL_COMMUNITY): Payer: Medicare Other | Admitting: Certified Registered"

## 2020-10-16 DIAGNOSIS — I132 Hypertensive heart and chronic kidney disease with heart failure and with stage 5 chronic kidney disease, or end stage renal disease: Secondary | ICD-10-CM | POA: Insufficient documentation

## 2020-10-16 DIAGNOSIS — Z953 Presence of xenogenic heart valve: Secondary | ICD-10-CM | POA: Insufficient documentation

## 2020-10-16 DIAGNOSIS — I5042 Chronic combined systolic (congestive) and diastolic (congestive) heart failure: Secondary | ICD-10-CM | POA: Insufficient documentation

## 2020-10-16 DIAGNOSIS — N186 End stage renal disease: Secondary | ICD-10-CM | POA: Diagnosis not present

## 2020-10-16 DIAGNOSIS — I712 Thoracic aortic aneurysm, without rupture: Secondary | ICD-10-CM | POA: Diagnosis not present

## 2020-10-16 DIAGNOSIS — I2510 Atherosclerotic heart disease of native coronary artery without angina pectoris: Secondary | ICD-10-CM | POA: Diagnosis not present

## 2020-10-16 DIAGNOSIS — I5022 Chronic systolic (congestive) heart failure: Secondary | ICD-10-CM

## 2020-10-16 DIAGNOSIS — Z888 Allergy status to other drugs, medicaments and biological substances status: Secondary | ICD-10-CM | POA: Insufficient documentation

## 2020-10-16 DIAGNOSIS — F17210 Nicotine dependence, cigarettes, uncomplicated: Secondary | ICD-10-CM | POA: Insufficient documentation

## 2020-10-16 DIAGNOSIS — Z885 Allergy status to narcotic agent status: Secondary | ICD-10-CM | POA: Insufficient documentation

## 2020-10-16 DIAGNOSIS — I080 Rheumatic disorders of both mitral and aortic valves: Secondary | ICD-10-CM | POA: Insufficient documentation

## 2020-10-16 DIAGNOSIS — Z8249 Family history of ischemic heart disease and other diseases of the circulatory system: Secondary | ICD-10-CM | POA: Insufficient documentation

## 2020-10-16 DIAGNOSIS — I340 Nonrheumatic mitral (valve) insufficiency: Secondary | ICD-10-CM

## 2020-10-16 DIAGNOSIS — I350 Nonrheumatic aortic (valve) stenosis: Secondary | ICD-10-CM | POA: Insufficient documentation

## 2020-10-16 DIAGNOSIS — Z8673 Personal history of transient ischemic attack (TIA), and cerebral infarction without residual deficits: Secondary | ICD-10-CM | POA: Insufficient documentation

## 2020-10-16 DIAGNOSIS — Z992 Dependence on renal dialysis: Secondary | ICD-10-CM | POA: Diagnosis not present

## 2020-10-16 DIAGNOSIS — Z7982 Long term (current) use of aspirin: Secondary | ICD-10-CM | POA: Diagnosis not present

## 2020-10-16 DIAGNOSIS — Z79899 Other long term (current) drug therapy: Secondary | ICD-10-CM | POA: Diagnosis not present

## 2020-10-16 HISTORY — PX: TEE WITHOUT CARDIOVERSION: SHX5443

## 2020-10-16 SURGERY — ECHOCARDIOGRAM, TRANSESOPHAGEAL
Anesthesia: Monitor Anesthesia Care

## 2020-10-16 MED ORDER — SODIUM CHLORIDE 0.9 % IV SOLN: INTRAVENOUS | Status: DC

## 2020-10-16 MED ORDER — PROPOFOL 500 MG/50ML IV EMUL
INTRAVENOUS | Status: DC | PRN
  Administered 2020-10-16: 20 mg via INTRAVENOUS
  Administered 2020-10-16: 100 ug/kg/min via INTRAVENOUS
  Administered 2020-10-16: 30 mg via INTRAVENOUS
  Administered 2020-10-16: 20 mg via INTRAVENOUS

## 2020-10-16 MED ORDER — LIDOCAINE 2% (20 MG/ML) 5 ML SYRINGE
INTRAMUSCULAR | Status: DC | PRN
  Administered 2020-10-16 (×2): 50 mg via INTRAVENOUS

## 2020-10-16 NOTE — Progress Notes (Signed)
  Echocardiogram Echocardiogram Transesophageal has been performed.  Michiel Cowboy 10/16/2020, 10:33 AM

## 2020-10-16 NOTE — Interval H&P Note (Signed)
History and Physical Interval Note:  10/16/2020 9:51 AM  [name]  has presented today for surgery, with the diagnosis of AVR.  The various methods of treatment have been discussed with the patient and family. After consideration of risks, benefits and other options for treatment, the patient has consented to  Procedure(s): TRANSESOPHAGEAL ECHOCARDIOGRAM (TEE) (N/A) as a surgical intervention.  The patient's history has been reviewed, patient examined, no change in status, stable for surgery.  I have reviewed the patient's chart and labs.  Questions were answered to the patient's satisfaction.     Alen Matheson [employer]

## 2020-10-16 NOTE — Transfer of Care (Signed)
Immediate Anesthesia Transfer of Care Note  Patient: [name]  Procedure(s) Performed: TRANSESOPHAGEAL ECHOCARDIOGRAM (TEE)  Patient Location: Endoscopy Unit  Anesthesia Type:MAC  Level of Consciousness: drowsy and patient cooperative  Airway & Oxygen Therapy: Patient Spontanous Breathing and Patient connected to nasal cannula oxygen  Post-op Assessment: Report given to RN, Post -op Vital signs reviewed and stable and Patient moving all extremities  Post vital signs: Reviewed and stable  Last Vitals:  Vitals Value Taken Time  BP 106/42 10/16/20 1015  Temp 36.1 C 10/16/20 1015  Pulse 72 10/16/20 1017  Resp 16 10/16/20 1017  SpO2 99 % 10/16/20 1017  Vitals shown include unvalidated device data.  Last Pain:  Vitals:   10/16/20 1015  TempSrc: Axillary  PainSc: 0-No pain         Complications: No notable events documented.

## 2020-10-16 NOTE — CV Procedure (Signed)
Procedure: TEE  Sedation: Per anesthesiology  Indication: Bioprosthetic aortic valve stenosis.   Findings: Please see echo section for full report.  Normal LV size with severe LV hypertrophy.  EF 60-65%, no regional wall motion abnormalities.  Normal RV size and systolic function.  Mild left and right atrial enlargement.  No LA appendage thrombus.  No PFO/ASD by color doppler.  Trivial TR.  Mild mitral regurgitation, no stenosis.  There is a bioprosthetic aortic valve.  The valve appears to open with minimal restriction, but appears small/undersized.  No perivalvular leakage/regurgitation, elevated mean gradient at 20 mmHg.  S/p aortic root replacement, stable.    Impression: Moderately elevated mean gradient across the aortic valve (lower while under anesthesia, 20 mmHg mean).  The valve appears to open with minimal restriction, suspect his chronically elevated gradient may be related to an undersized/small valve.    [name] 10/16/2020 10:15 AM

## 2020-10-16 NOTE — Anesthesia Preprocedure Evaluation (Signed)
Anesthesia Evaluation  Patient identified by MRN, date of birth, ID band Patient awake    Reviewed: Allergy & Precautions, NPO status , Patient's Chart, lab work & pertinent test results  Airway Mallampati: III  TM Distance: >3 FB Neck ROM: Full    Dental no notable dental hx.    Pulmonary COPD, Current Smoker and Patient abstained from smoking.,    Pulmonary exam normal breath sounds clear to auscultation       Cardiovascular hypertension, Pt. on home beta blockers and Pt. on medications + CAD, + Past MI and +CHF  Normal cardiovascular exam Rhythm:Regular Rate:Normal  ECG: ST   Neuro/Psych  Headaches, Seizures -,  PSYCHIATRIC DISORDERS Anxiety Depression CVA    GI/Hepatic Neg liver ROS, GERD  Medicated and Controlled,  Endo/Other  negative endocrine ROS  Renal/GU ESRF and DialysisRenal disease     Musculoskeletal  (+) Arthritis ,   Abdominal   Peds  Hematology negative hematology ROS (+)   Anesthesia Other Findings ESRD  Reproductive/Obstetrics                             Anesthesia Physical  Anesthesia Plan  ASA: 3  Anesthesia Plan: MAC   Post-op Pain Management:    Induction: Intravenous  PONV Risk Score and Plan: 0 and Propofol infusion and Treatment may vary due to age or medical condition  Airway Management Planned: Nasal Cannula  Additional Equipment:   Intra-op Plan:   Post-operative Plan:   Informed Consent: I have reviewed the patients History and Physical, chart, labs and discussed the procedure including the risks, benefits and alternatives for the proposed anesthesia with the patient or authorized representative who has indicated his/her understanding and acceptance.       Plan Discussed with: CRNA  Anesthesia Plan Comments:         Anesthesia Quick Evaluation

## 2020-10-16 NOTE — Anesthesia Postprocedure Evaluation (Signed)
Anesthesia Post Note  Patient: [name]  Procedure(s) Performed: TRANSESOPHAGEAL ECHOCARDIOGRAM (TEE)     Patient location during evaluation: PACU Anesthesia Type: MAC Level of consciousness: awake and alert Pain management: pain level controlled Vital Signs Assessment: post-procedure vital signs reviewed and stable Respiratory status: spontaneous breathing, nonlabored ventilation and respiratory function stable Cardiovascular status: blood pressure returned to baseline and stable Postop Assessment: no apparent nausea or vomiting Anesthetic complications: no   No notable events documented.  Last Vitals:  Vitals:   10/16/20 1015 10/16/20 1020  BP: (!) 106/42   Pulse: 73 71  Resp: 20 20  Temp: (!) 36.1 C 36.8 C  SpO2: 96% 99%    Last Pain:  Vitals:   10/16/20 1025  TempSrc:   PainSc: 0-No pain                 Lynda Rainwater

## 2020-10-16 NOTE — Discharge Instructions (Signed)

## 2020-10-17 ENCOUNTER — Encounter (HOSPITAL_COMMUNITY): Payer: Self-pay | Admitting: Cardiology

## 2020-10-22 ENCOUNTER — Ambulatory Visit: Payer: Medicare HMO | Admitting: Vascular Surgery

## 2020-10-23 ENCOUNTER — Telehealth: Payer: Self-pay

## 2020-10-23 ENCOUNTER — Telehealth (HOSPITAL_COMMUNITY): Payer: Self-pay | Admitting: *Deleted

## 2020-10-23 NOTE — Telephone Encounter (Signed)
[facility] called to report pts bp was 190/98 and he had a slight headache. Pt had not taken his bp medications yet. Pt took meds while I was speaking with nurse.  I called pt back an hour later to have him recheck bp and it was 144/80 and he no longer had a headache. Pt said he only takes his carvedilol every other day because it makes him nauseous. RN advised him to take his medication everyday.   Routed to [facility] to see if pt needs to make any changes

## 2020-10-23 NOTE — Telephone Encounter (Signed)
Stanton Kidney, nurse with [facility] home health called to let us know pt is having LUE pain. She was unaware he had been given a rx after surgery for narcotic and is going to let pt know. I have advised her to have pt elevate his arm often and if the pain does not improve to call us back and we can schedule an appt. She verbalized understanding and will call us if further assistance is needed.

## 2020-10-31 LAB — ECHO TEE
AR max vel: 1.78 cm2
AV Area VTI: 2.02 cm2
AV Area mean vel: 1.93 cm2
AV Mean grad: 17 mmHg
AV Peak grad: 29.7 mmHg
Ao pk vel: 2.72 m/s

## 2020-11-01 ENCOUNTER — Other Ambulatory Visit: Payer: Self-pay

## 2020-11-01 DIAGNOSIS — N186 End stage renal disease: Secondary | ICD-10-CM

## 2020-11-06 ENCOUNTER — Telehealth (INDEPENDENT_AMBULATORY_CARE_PROVIDER_SITE_OTHER): Payer: Self-pay | Admitting: Primary Care

## 2020-11-06 NOTE — Telephone Encounter (Unsigned)
Home Health Verbal Orders - Caller/Agency: Dava Najjar Number: [phone] vm can be left  Requesting PT Frequency: extend PT 1 week 1 to update his home exercise program and review safety precautions

## 2020-11-06 NOTE — Telephone Encounter (Signed)
Left voicemail on secure voicemail giving verbal orders.

## 2020-11-12 ENCOUNTER — Telehealth (HOSPITAL_COMMUNITY): Payer: Self-pay

## 2020-11-12 ENCOUNTER — Inpatient Hospital Stay (HOSPITAL_COMMUNITY): Admit: 2020-11-12 | Payer: Medicare HMO

## 2020-11-12 NOTE — Telephone Encounter (Signed)
Called and left patient a detailed message on his voice message to confirm/remind patient of their appointment at the [facility] on 11/13/20.   Patient reminded to bring all medications and/or complete list.  Confirmed patient has transportation. Gave directions, instructed to utilize [place] parking.  Confirmed appointment prior to ending call.

## 2020-11-13 ENCOUNTER — Encounter (HOSPITAL_COMMUNITY): Payer: Medicare HMO

## 2020-11-20 IMAGING — CR DG CHEST 2V
2 series · 2 of 2 positions shown · non-contrast
Comparison: 12/02/2017 chest CT and chest radiograph, and prior
studies

CLINICAL DATA: Heart failure.

EXAM:
CHEST - 2 VIEW

[chest pa]
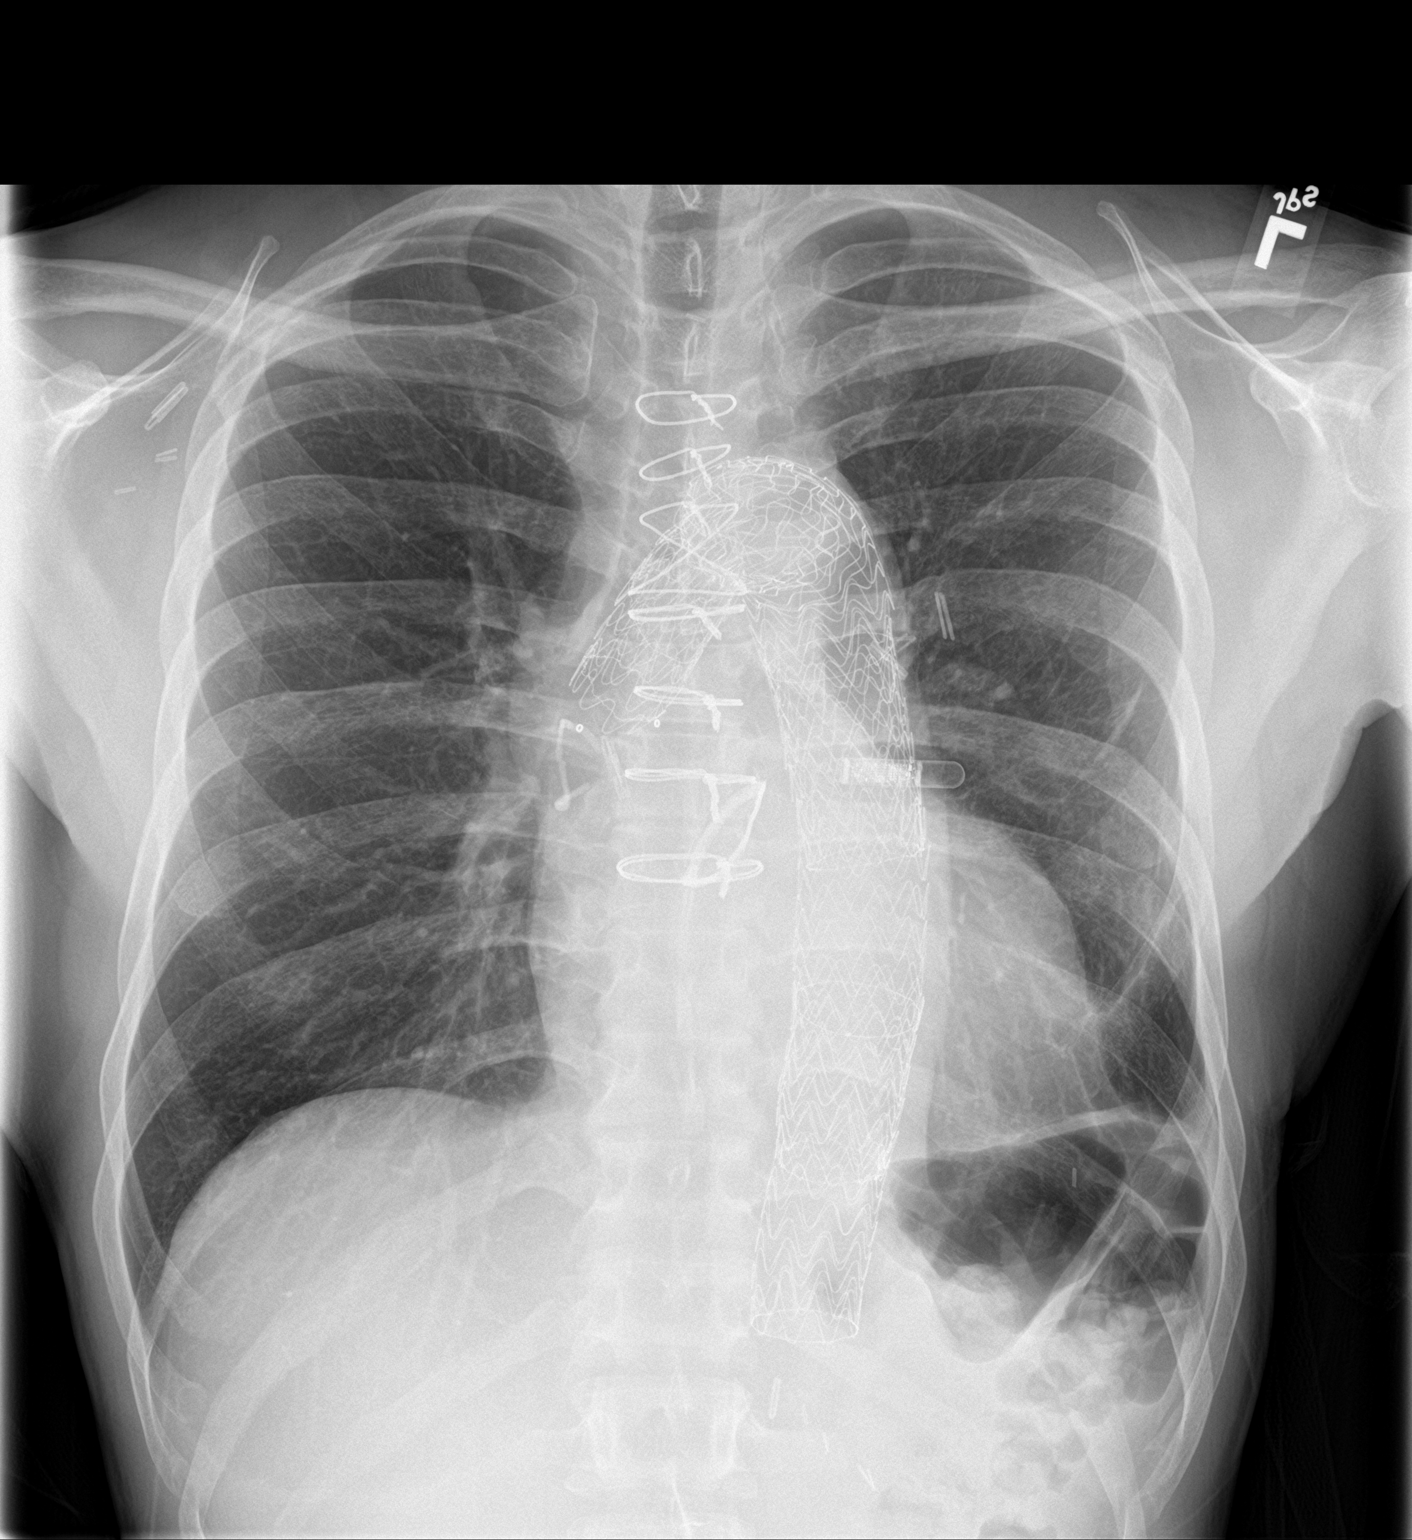

[chest lat]
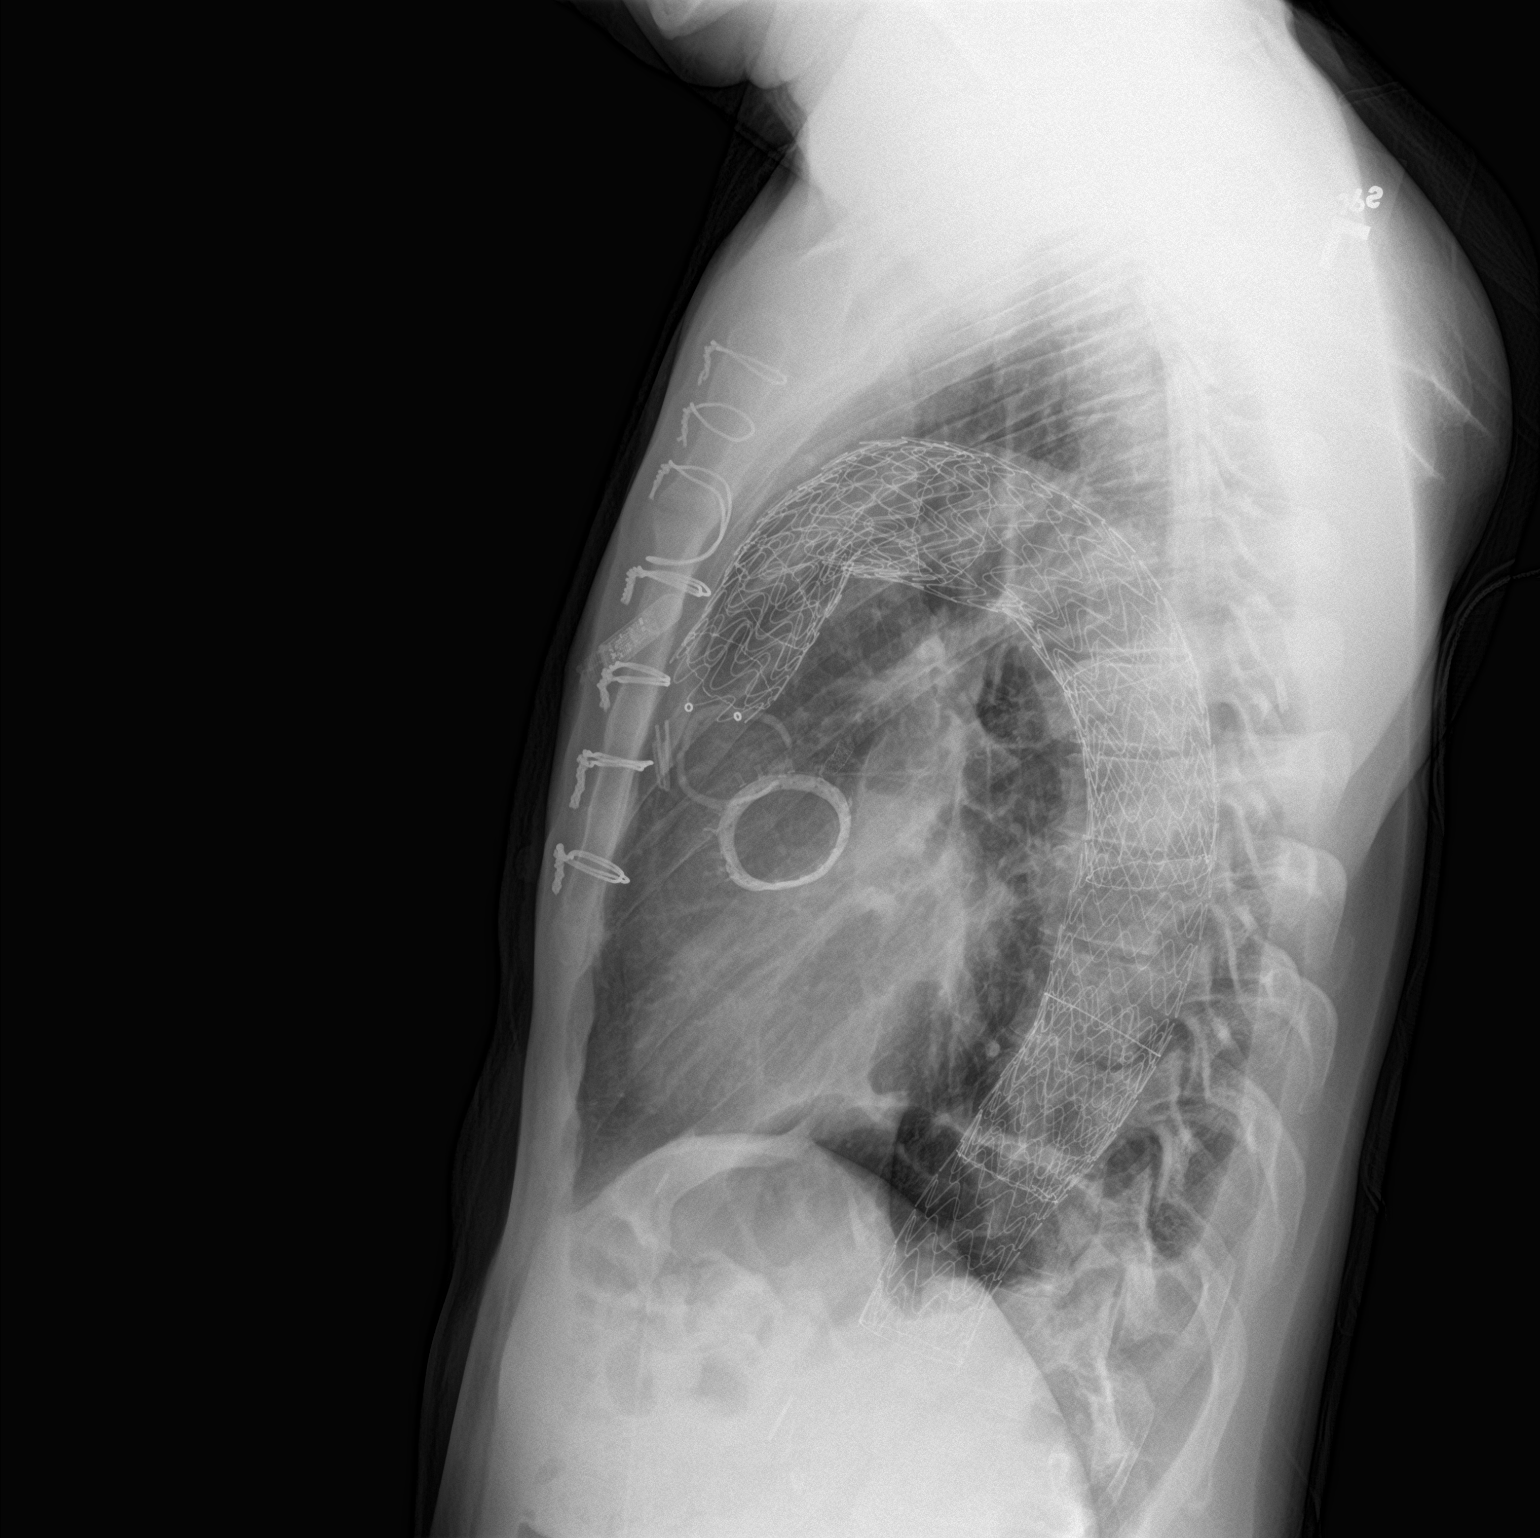

[2 of 2 positions shown; findings below may reference images not displayed]

FINDINGS: Cardiomegaly, aortic valve replacement and thoracic aortic stent
graft again noted.

A loop recorder overlying the LEFT chest is noted.

Mild LEFT basilar scarring again noted.

There is no evidence of focal airspace disease, pulmonary edema,
suspicious pulmonary nodule/mass, pleural effusion, or pneumothorax.

No acute bony abnormalities are identified.
IMPRESSION: Cardiomegaly without evidence of acute cardiopulmonary disease.

## 2020-11-22 ENCOUNTER — Telehealth (HOSPITAL_COMMUNITY): Payer: Self-pay

## 2020-11-22 NOTE — Telephone Encounter (Signed)
Called and spoke to pt's spouse to confirm/remind patient of their appointment at the [facility] on 11/25/20.   Patient reminded to bring all medications and/or complete list.  Confirmed patient has transportation. Gave directions, instructed to utilize [place] parking.  Confirmed appointment prior to ending call.

## 2020-11-25 ENCOUNTER — Other Ambulatory Visit: Payer: Self-pay

## 2020-11-25 ENCOUNTER — Ambulatory Visit (HOSPITAL_COMMUNITY)
Admission: RE | Admit: 2020-11-25 | Discharge: 2020-11-25 | Disposition: A | Discharge status: Home / Self Care | Payer: Medicare Other | Source: Ambulatory Visit | Attending: Internal Medicine | Admitting: Internal Medicine

## 2020-11-25 ENCOUNTER — Encounter (HOSPITAL_COMMUNITY): Payer: Self-pay

## 2020-11-25 VITALS — BP 170/98 | HR 75 | Wt 154.6 lb

## 2020-11-25 DIAGNOSIS — Z992 Dependence on renal dialysis: Secondary | ICD-10-CM | POA: Diagnosis not present

## 2020-11-25 DIAGNOSIS — Z09 Encounter for follow-up examination after completed treatment for conditions other than malignant neoplasm: Secondary | ICD-10-CM | POA: Diagnosis not present

## 2020-11-25 DIAGNOSIS — H538 Other visual disturbances: Secondary | ICD-10-CM | POA: Insufficient documentation

## 2020-11-25 DIAGNOSIS — Z8673 Personal history of transient ischemic attack (TIA), and cerebral infarction without residual deficits: Secondary | ICD-10-CM | POA: Insufficient documentation

## 2020-11-25 DIAGNOSIS — I359 Nonrheumatic aortic valve disorder, unspecified: Secondary | ICD-10-CM

## 2020-11-25 DIAGNOSIS — F17210 Nicotine dependence, cigarettes, uncomplicated: Secondary | ICD-10-CM | POA: Insufficient documentation

## 2020-11-25 DIAGNOSIS — I5022 Chronic systolic (congestive) heart failure: Secondary | ICD-10-CM

## 2020-11-25 DIAGNOSIS — I5042 Chronic combined systolic (congestive) and diastolic (congestive) heart failure: Secondary | ICD-10-CM | POA: Diagnosis present

## 2020-11-25 DIAGNOSIS — N186 End stage renal disease: Secondary | ICD-10-CM

## 2020-11-25 DIAGNOSIS — E785 Hyperlipidemia, unspecified: Secondary | ICD-10-CM | POA: Insufficient documentation

## 2020-11-25 DIAGNOSIS — Z7982 Long term (current) use of aspirin: Secondary | ICD-10-CM | POA: Insufficient documentation

## 2020-11-25 DIAGNOSIS — Z953 Presence of xenogenic heart valve: Secondary | ICD-10-CM | POA: Insufficient documentation

## 2020-11-25 DIAGNOSIS — I10 Essential (primary) hypertension: Secondary | ICD-10-CM

## 2020-11-25 DIAGNOSIS — Z9889 Other specified postprocedural states: Secondary | ICD-10-CM

## 2020-11-25 DIAGNOSIS — I2510 Atherosclerotic heart disease of native coronary artery without angina pectoris: Secondary | ICD-10-CM | POA: Diagnosis not present

## 2020-11-25 DIAGNOSIS — I132 Hypertensive heart and chronic kidney disease with heart failure and with stage 5 chronic kidney disease, or end stage renal disease: Secondary | ICD-10-CM | POA: Diagnosis not present

## 2020-11-25 DIAGNOSIS — Z7901 Long term (current) use of anticoagulants: Secondary | ICD-10-CM | POA: Diagnosis not present

## 2020-11-25 DIAGNOSIS — I252 Old myocardial infarction: Secondary | ICD-10-CM | POA: Insufficient documentation

## 2020-11-25 DIAGNOSIS — Z8249 Family history of ischemic heart disease and other diseases of the circulatory system: Secondary | ICD-10-CM | POA: Diagnosis not present

## 2020-11-25 DIAGNOSIS — Z79899 Other long term (current) drug therapy: Secondary | ICD-10-CM | POA: Insufficient documentation

## 2020-11-25 DIAGNOSIS — Z720 Tobacco use: Secondary | ICD-10-CM

## 2020-11-25 NOTE — Patient Instructions (Addendum)
PLEASE check Blood Pressure at home if your systolic (top number) blood pressure is over 150 please call the clinic   Keep your scheduled appointment   At the [facility], you and your health needs are our priority. As part of our continuing mission to provide you with exceptional heart care, we have created designated Provider Care Teams. These Care Teams include your primary Cardiologist (physician) and Advanced Practice Providers (APPs- Physician Assistants and Nurse Practitioners) who all work together to provide you with the care you need, when you need it.   You may see any of the following providers on your designated Care Team at your next follow up: Dr Glori Bickers Dr [name] Dr Patrice Paradise, NP Lyda Jester, [place] Ginnie Smart Audry Riles, PharmD   Please be sure to bring in all your medications bottles to every appointment.    If you have any questions or concerns before your next appointment please send [name] a message through [place] or call our office at [phone].    TO LEAVE A MESSAGE FOR THE NURSE SELECT OPTION 2, PLEASE LEAVE A MESSAGE INCLUDING: YOUR NAME DATE OF BIRTH CALL BACK NUMBER REASON FOR CALL**this is important as we prioritize the call backs  YOU WILL RECEIVE A CALL BACK THE SAME DAY AS LONG AS YOU CALL BEFORE 4:00 PM

## 2020-11-25 NOTE — Progress Notes (Signed)
Date:  11/25/2020   ID:  [name], DOB Nov 07, 1970, MRN [id number]   Provider location: [facility] Type of Visit: Established patient   PCP:  Kerin Perna, NP  HF Cardiologist:  Dr. Aundra Dubin   History of Present Illness: [name] is a 50 y.o. 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  The descending thoracic aorta has dilated to 5.1 cm.  Cardiac catheterization in April 2016 demonstrated 60% first diagonal lesion.  He underwent aortic valve replacement with bovine pericardial tissue valve and reconstruction along with aortic root replacement and endovascular repair of the descending thoracic aortic aneurysm at [facility].  He has had intermittent chest discomfort since then.   Admitted in 12/02/2017 w/ chest pain. Had NSTEMI. He had LHC with stent placed proximal ramus.  Repeat cath in 7/20 with nonobstructive disease.   He was admitted in 9/21 with atypical chest pain.  Echo showed EF 60-65% with severe LVH, normal RV, bioprosthetic aortic valve with mean gradient 18 mmHg.  Cardiolite showed no ischemia.   Follow up w/ Dr. Aundra Dubin 2/22, he continued to complain of chronic non-exertional CP. Symptoms c/w GERD, however not relived w/ PPI. He was referred to GI for further evaluation. CXR was also obtained and unremarkable. His BP had been elevated and meds adjusted. Hydralazine 25 mg tid was added and he was ordered to get renal artery dopplers to assess for RAS. This showed moderate RAS on the rt w/ 1-59% stenosis. The left RA was normal. He was scheduled for return f/u  w/ PharmD 3/22 for reassessment of BP and further med titration, if needed, but he no showed.   He ended up going to the ED on 05/17/20 for CP. EKG showed no changes. Found to have elevated HS trop of 136 (prior baseline levels 60-80). This was also in the setting of worsening SCr, up to 3.92. General cardiology was consulted. Echo was completed and showed preserved LVEF severe LVH, increased mean gradient of bioprosthetic AVR, 31 mmHg (see full report below). It was noted that he had diffuse tenderness/pain on exam of chest, and there are palpable ribs that are displaced/pop when palpated. He was given trial of topical lidoderm patch, which helped some. His elevated troponins and pain were not consistent with NSTEMI, suspect that this is due to stress from pain and worsening CKD. He was discharged home w/ Lidoderm patch for pain.   He returned to clinic 6/22 for HF follow up. He had seen nephrology recently, discussions had about needing HD. Weight at home 152 pounds. BP at home ~170. Jardiance was stopped this visit with GFR 14, and hydralazine was increased for elevated BP.  He presented to [facility] 09/02/20 with CP. HsT 85 > 79> 76, in the setting of CKD progressing to ESRD. EKG without acute ischemic change, LVH and repolarization abnormalities. CRP 0.6 and ESR 6 WNL, less likely pericarditis/myocarditis. Repeat echo showed EF>75%, hyperdynamic, Grade I DD, RV ok, moderate bi-atrial enlargement, moderate AS. Cardiology felt pain was non-cardiac in origin. Nephrology was consulted as patient's  SCr was 6.9 on admission; he displayed no uremia symptoms. AV fistula and temporary dialysis catheter were placed 09/09/20 by [facility]. Doxazosin, minoxidil and amlodipine were discontinued due to soft BP. He was continued on carvedilol, clonidine, hydralazine, Imdur.  He tolerated iHD in the hospital well and was set up for M/T/Th/S at discharge. He was evaluated by ophthalmology for new bilateral blurred vision. Felt to have  dry eye, recommended artifical tears, new Rx for glasses, and control of blood pressure. Discharge weight was 153.7 lbs.   He presented back to [facility] 09/16/20 with complaints of right facial droop after being seen for dialysis. Exam was consistent with Bell's palsy, last known well time 3 days prior so patient was outside of the window for acute stroke intervention. He was asked to stay for further evaluation however he left.  Today he returns for HF follow up. Still adjusting to HD, but feeling OK overall. No significant exertional dyspnea walking on flat ground. Denies CP, dizziness, edema, or PND/Orthopnea. Appetite ok. No fever or chills. Weight at home 152 pounds. Taking all medications. Still makes urine. BP at home ~140s. Now drinks ETOH only socially, down from drinking daily.  ECG (personally reviewed): none ordered today.  Labs (10/19): K 4.1, creatinine 1.78, myeloma panel negative Labs (2/20): K 4.2, creatinine 1.75, LDL 128 Labs (7/21): LDL 66 Labs (9/21): K 4.1, creatinine 3.31, hgb 11.9 Labs (10/21): K 4, creatinine 2.96, hgb 12.8 Labs (2/22): SCr 3.90, 4.0, hgb 12.7 Labs (3/22): SCr 3.92, 3.7, hgb 12.0 Labs (3/22): Scr 4.01, 3.8, hgb 11.5  Labs (5/22): K 4.6, creatinine 4.8 Labs (7/22): K 4.1, creatinine 4.39  Review of Systems: All systems reviewed and negative except as per HPI.    PMH: 1. H/o ankle fractures bilaterally 2. GERD 3. HTN: Negative urinary catecholeamine collection for pheochromocytoma.  4. CKD: Now ESRD. Suspect hypertensive nephropathy. Now s/p TDC and AV fistula 6/22. 5. Cardiomyopathy: Most likely due to heavy ETOH ingestion and uncontrolled HTN.   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  Lexiscan myoview (4/13): EF 37%, global hypokinesis, no ischemic or infarction.  Echo (12/14) with EF 45-50%, diffuse hypokinesis, moderate LVH,  mild MR, moderate AI.  Echo (4/16) with EF 55-60%, severe LVH, mild LV dilation, moderate AI and moderate MR.   - Echo (9/19): EF 35-40% with diffuse hypokinesis, severe LVH, bioprosthetic aortic valve functioning normally.  - PYP scan (1/20): No evidence for TTR amyloidosis.  - Echo (9/21): EF 60-65%, severe LVH, normal RV, bioprosthetic aortic valve with mean gradient 18 mmHg.  - Echo (3/22): EF 60-65%, severe LV dysfunction, Grade II DD, RV ok, severe LAE, moderate RAE, small pericardial effusion, moderate AS, Aortic valve mean gradient measures 31.7 mmHg. - Echo (6/22): EF>75%, hyperdynamic, Grade I DD, RV ok, moderate bi-atrial enlargement, moderate AS, mean gradient measures 33.5 mmHg. 6. ETOH abuse: Quit 3/13.  7. Active smoker. 8. Valvular heart disease: Echo in 3/13 showed moderate to severe eccentric MR and mild to moderate AI with prolapsing left coronary cusp. Echo 12/14 with mild MR and moderate AI.  Echo 4/16 with moderate AI and moderate MR.  - S/p Bentall procedure with bioprosthetic aortic valve 9/16.  9.  CVA: Has ILR. 10. Type B aortic dissection: Occurred in 2/16, from just distal to the left subclavian.  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    -  10/03/2014 First stage median sternotomy with right axillary cannulation for aortic valved conduit root replacement with coronary reconstruction (button Bentall Procedure), 27 mm Sorin Mitroflow bovine pericardial valved conduit, total arch replacement (75mm Vascutek [place] multibranch arch graft with individual head vessel reimplantation. - Status post  open repair of an Extent III TAA secondary to chronic dissection with a # 69mm Vascutek "Coselli" multibranch Dacron graft with individual celiac, SMA, and bilateral renal artery implantation on 01/04/2017, [facility].  11. CAD: LHC (4/16) with 60% ostial D1.   - NSTEMI 9/19: LHC (9/19) showed 50% proximal LCx, 90% ramus => DES to ramus.  - LHC (7/20): D1 60% stenosis,  patent ramus stent.  - Cardiolite (9/21): Inferior fixed defect, no ischemia.  12. Sleep study (10/21) with no significant OSA.   Current Outpatient Medications  Medication Sig Dispense Refill   acetaminophen (TYLENOL) 325 MG tablet Take 650 mg by mouth every 6 (six) hours as needed for moderate pain (for pain or headaches).     albuterol (VENTOLIN HFA) 108 (90 Base) MCG/ACT inhaler Inhale 2 puffs into the lungs every 6 (six) hours as needed for wheezing or shortness of breath.     aspirin EC 81 MG EC tablet Take 1 tablet (81 mg total) by mouth daily. 90 tablet 3   atorvastatin (LIPITOR) 80 MG tablet Take 1 tablet (80 mg total) by mouth daily. 30 tablet 2   Blood Pressure Monitor KIT 1 Device by Does not apply route 2 (two) times daily. 1 kit 0   carvedilol (COREG) 25 MG tablet Take 25 mg by mouth 2 (two) times daily with a meal.     hydrALAZINE (APRESOLINE) 50 MG tablet Take 1.5 tablets (75 mg total) by mouth 3 (three) times daily. 135 tablet 1   isosorbide mononitrate (IMDUR) 30 MG 24 hr tablet Take 1 tablet (30 mg total) by mouth daily. 30 tablet 0   oxyCODONE-acetaminophen (PERCOCET/ROXICET) 5-325 MG tablet Take 1 tablet by mouth every 6 (six) hours as needed. 30 tablet 0   pantoprazole (PROTONIX) 40 MG tablet Take 1 tablet (40 mg total) by mouth daily. 30 tablet 6   STIOLTO RESPIMAT 2.5-2.5 MCG/ACT AERS INHALE 2 PUFFS INTO THE LUNGS DAILY 4 g 5   Evolocumab (REPATHA SURECLICK) 010 MG/ML SOAJ Inject 1 Dose into the skin every 14 (fourteen) days. (Patient not taking: Reported on 11/25/2020) 2 mL 11   No current facility-administered medications for this encounter.    Allergies:   Imdur [isosorbide nitrate] and Tramadol   Social History:  The patient  reports that he has been smoking cigarettes. He has a 5.75 pack-year smoking history. He has never used smokeless tobacco. He reports that he does not currently use alcohol after a past usage of about 1.0 standard drink per week. He reports  current drug use. Drug: Marijuana.   Family History:  The patient's family history includes Colon cancer in his paternal uncle; Diabetes in his maternal aunt; Headache in his sister; Heart attack in his brother; Hypertension in his brother, mother, and another family member; Lung cancer in his maternal uncle; Other in his sister; Stroke in his maternal aunt and maternal uncle; Thyroid disease in his sister.   ROS:  Please see the history of present illness.   All other systems are personally reviewed and negative.   Exam:   BP (!) 170/98   Pulse 75   Wt 70.1 kg (154 lb 9.6 oz)   SpO2 98%   BMI 21.56 kg/m  General:  NAD. No resp difficulty HEENT: Normal Neck: Supple. No JVD. Carotids 2+ bilat; no bruits. No lymphadenopathy or thryomegaly appreciated. Cor: PMI nondisplaced. Regular rate & rhythm. No rubs, gallops, III/VI SEM RUSB Lungs: Clear; TDC Right upper chest Abdomen: Soft, nontender, nondistended. No hepatosplenomegaly. No bruits or masses. Good bowel sounds. Extremities: No cyanosis, clubbing, rash; Left AVF +/+ Neuro: Alert & oriented x 3, cranial nerves grossly intact. Moves all 4 extremities w/o difficulty. Affect pleasant.  Recent Labs: 07/23/2020: B Natriuretic Peptide 334.1 09/05/2020: ALT 14; TSH 3.472 09/16/2020: Platelets 87 10/09/2020: BUN 47; Creatinine, Ser 7.50; Hemoglobin 11.6; Potassium 3.8; Sodium 136  Personally reviewed   Wt Readings from Last 3 Encounters:  11/25/20 70.1 kg (154 lb 9.6 oz)  10/16/20 71.7 kg (158 lb)  10/09/20 66.2 kg (146 lb)    ASSESSMENT AND PLAN:  1. CAD: 9/19 NSTEMI with PCI to proximal ramus.  LHC in 7/20 with no obstructive disease.  Admission in 9/21 with Cardiolite showing no ischemia. Admit 3/22 for CP, suspected to be musculoskeletal.  He has long-standing primarily non-exertional chest pain.  I suspect that it may be musculoskeletal and related to his prior surgeries. No anginal symptomatology.  - Continue ASA 81 + ? blocker. - He  is on Repatha via the lipid clinic.  2. 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 Myeloma panel negative and PYP scan negative, suspect due more likely to HTN than hypertrophic cardiomyopathy.  Cannot get cardiac MRI with elevated creatinine.  Echo in 9/21 showed EF up to 60-65%, severe LVH, normal RV. Recent echo 3/22 EF 60-65%, severe LVH, RV normal. normalized again in 05/2020. Echo from 09/03/2020 EF > 75%, grade 1 DD, LV hyperdynamic function; concern for speckled appearance.  Light chains/SPEP/UPEP ordered. Consider repeating PYP. NYHA class II symptoms. Euvolemic on exam today, volume controlled with HD. GDMT limited due to ESRD.  - Continue hydralazine 75 mg tid. - Continue Coreg 25 mg bid.  - Continue Imdur 30 mg daily. - He is off spironolactone, Jardiance and Entresto with ESRD. 3. HTN: Still needs better control.  Sleep study in 10/21 was negative for OSA. He did not take his AM meds yet. Home BP 140s. Can consider adding back amlodipine next if BP not at goal. - Amlodipine, minoxidil, and Cardura stopped last admission due to soft BP.  - Continue hydralazine 75 mg tid, Imdur 30 mg daily & beta blocker. - He does not have other good options for antihypertensives.  - Renal artery dopplers completed 3/22 to rule out renal artery stenosis and showed mild Rt RAS 1-59%. No Lt sided RAS.  4. S/P Bioprothetic AVR: Echo in 6/22 with increase in mean gradient (33.5 mmHg). TEE (8/22) mean gradient 20 mmHg (under anesthesia); suspect his chronically elevated gradient may be related to an undersized/small valve.   - SBE prophylaxis w/ dental work.  5. S/P 2016 and 2018 thoracic aortic aneurysm repair: Followed by Dr. [name] at [facility].  - lost to f/u. He plans to call to make an appointment. 6. ESRD: New SCr baseline closer to 4 range, nonoliguric.  He is now on iHD T/Th/Sat as of 09/10/20. - He is followed by [facility]. - Labs followed  by Nephrology. 7. Active smoker:  Smoking cessation advised, he has been cutting back, down to 3 cigs/day.  Follow up in 2 months with Dr. Aundra Dubin.   Signed, Rafael Bihari, [place] 11/25/2020  [facility]  [place] [address] [phone] (office) [phone] (fax)

## 2020-12-01 IMAGING — NM NM TUMOR LOCAL/TRACER DISTR WITH SPECT
5 series · 25 of 25 positions shown · non-contrast
Comparison: none

CLINICAL DATA: HEART FAILURE. CONCERN FOR CARDIAC AMYLOIDOSIS.
47-year-old male. Aortic root replacement and endovascular repair of
descending thoracic aortic aneurysm.

EXAM:
NUCLEAR MEDICINE TUMOR LOCALIZATION. PYP CARDIAC AMYLOIDOSIS SCAN
WITH SPECT
TECHNIQUE: Following intravenous administration of radiopharmaceutical,
anterior planar images of the chest were obtained. Regions of
interest were placed on the heart and contralateral chest wall for
quantitative assessment. Additional SPECT imaging of the chest was
obtained.
RADIOPHARMACEUTICALS:  Twenty-one mCi TECHNETIUM 99 PYROPHOSPHATE

[Series 1: anterior · 4.14mm/px · 1 of 1 slices shown]
[im 1/1]
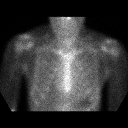

[Series 1: spect - (id)_(id)_tra · 4.1mm · 4.14mm/px · 6 of 128 frames shown (1 of 2)]
[frame 11/128]
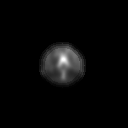
[frame 32/128]
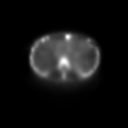
[frame 54/128]
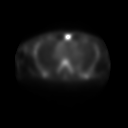
[frame 75/128]
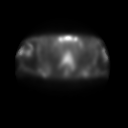
[frame 96/128]
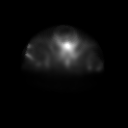
[frame 118/128]
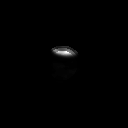

[Series 1: spect - (id)_(id)_tra · 4.1mm · 4.14mm/px · 6 of 128 frames shown (2 of 2)]
[frame 11/128]
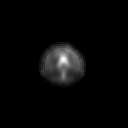
[frame 32/128]
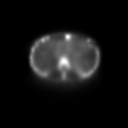
[frame 54/128]
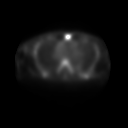
[frame 75/128]
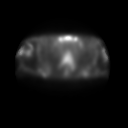
[frame 96/128]
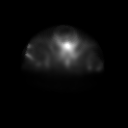
[frame 118/128]
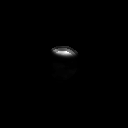

[Series 1: spect - (id)_(id)_cor · 4.1mm · 4.14mm/px · 6 of 128 frames shown]
[frame 11/128]
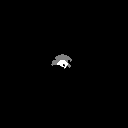
[frame 32/128]
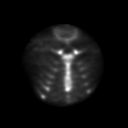
[frame 54/128]
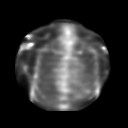
[frame 75/128]
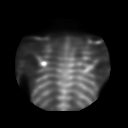
[frame 96/128]
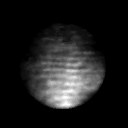
[frame 118/128]
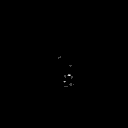

[Series 2: liver spect · 4.14mm/px · 6 of 64 frames shown]
[frame 6/64]
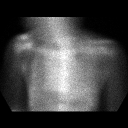
[frame 16/64]
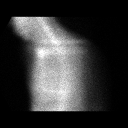
[frame 27/64]
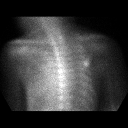
[frame 38/64]
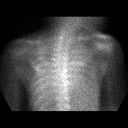
[frame 48/64]
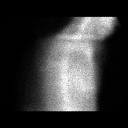
[frame 59/64]
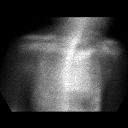

[25 of 25 positions shown; findings below may reference images not displayed]

FINDINGS: Planar Visual assessment:

Anterior planar imaging demonstrates radiotracer uptake within the
heart less than uptake within the adjacent ribs (Grade 1).

Quantitative assessment :

Quantitative assessment of the cardiac uptake compared to the
contralateral chest wall is equal to 1.1 (H/CL = 1.1).

SPECT assessment: SPECT imaging of the chest demonstrates minimal
radiotracer accumulation within the LEFT ventricle.
IMPRESSION: Visual and quantitative assessment (grade 1, H/CL equal 1.1) are NOT
suggestive of transthyretin amyloidosis.

## 2021-01-04 ENCOUNTER — Emergency Department (HOSPITAL_COMMUNITY)
Admission: EM | Admit: 2021-01-04 | Discharge: 2021-01-04 | Disposition: A | Discharge status: Home / Self Care | Payer: Medicare Other | Attending: Emergency Medicine | Admitting: Emergency Medicine

## 2021-01-04 DIAGNOSIS — F17210 Nicotine dependence, cigarettes, uncomplicated: Secondary | ICD-10-CM | POA: Diagnosis not present

## 2021-01-04 DIAGNOSIS — I5042 Chronic combined systolic (congestive) and diastolic (congestive) heart failure: Secondary | ICD-10-CM | POA: Insufficient documentation

## 2021-01-04 DIAGNOSIS — T82590A Other mechanical complication of surgically created arteriovenous fistula, initial encounter: Secondary | ICD-10-CM | POA: Insufficient documentation

## 2021-01-04 DIAGNOSIS — I132 Hypertensive heart and chronic kidney disease with heart failure and with stage 5 chronic kidney disease, or end stage renal disease: Secondary | ICD-10-CM | POA: Diagnosis not present

## 2021-01-04 DIAGNOSIS — Z992 Dependence on renal dialysis: Secondary | ICD-10-CM | POA: Diagnosis not present

## 2021-01-04 DIAGNOSIS — N186 End stage renal disease: Secondary | ICD-10-CM | POA: Diagnosis not present

## 2021-01-04 DIAGNOSIS — T829XXA Unspecified complication of cardiac and vascular prosthetic device, implant and graft, initial encounter: Secondary | ICD-10-CM

## 2021-01-04 MED ORDER — HYDROCODONE-ACETAMINOPHEN 5-325 MG PO TABS: 1.0000 | ORAL_TABLET | ORAL | 0 refills | Status: DC | PRN

## 2021-01-04 MED ORDER — MORPHINE SULFATE (PF) 4 MG/ML IV SOLN
4.0000 mg | Freq: Once | INTRAVENOUS | Status: AC
Start: 2021-01-04 — End: 2021-01-04
  Administered 2021-01-04: 4 mg via INTRAMUSCULAR
  Filled 2021-01-04: qty 1

## 2021-01-04 NOTE — Discharge Instructions (Addendum)
You will need to get dialysis through your port.  Keep arm iced and elevated.  Return for increased pain or swelling.

## 2021-01-04 NOTE — ED Triage Notes (Signed)
Pt BIB [facility] from Dialysis d/t infiltration of graft per dialysis center report to EMS. Site is swollen and painful.  Pt received 2hrs of a 4hr treatment.

## 2021-01-04 NOTE — ED Notes (Signed)
DC instructions reviewed with pt. Pt verbalized understanding.  Pt DC 

## 2021-01-04 NOTE — ED Provider Notes (Signed)
[facility] EMERGENCY DEPARTMENT Provider Note   CSN: [id number] Arrival date & time: 01/04/21  1418     History No chief complaint on file.   [name] is a 50 y.o. male.  Pt presents to the ED today with left AV fistula that has been infiltrated.  Pt had it placed in July.  It was used for the 2nd time today.  Pt said the nurse had trouble sticking it, but eventually thought the needles were in the right place and dialysis was started.  Pt had dialysis for about 2-2.5 hrs.  He said the site infiltrated shortly after dialysis start.  Pt said the site is very painful.  He denies any sob.      Past Medical History:  Diagnosis Date   Adenomatous colon polyp 08/2015   Anxiety    Aortic disease ([place])    Aortic dissection (HCC)    a. admx 04/2014 >> L renal infarct; a/c renal failure >> b.  s/p Bioprosthetic [place] and total arch replacement and staged endovascular repair of descending aortic aneurysm ([facility] - Dr. [name])   CAD (coronary artery disease)    a. LHC 4/16:  oD1 60%   Cardiomyopathy ([place])    a. non-ischemic - probably related to untreated HTN and ETOH abuse - Echo 3/13 with EF 35-40% >> b. Echo 4/16: Severe LVH, EF 55-60%, moderate AI, moderate MR, mild LAE, trivial effusion, known type B dissection with communication between true and false lumens with suprasternal images suggesting dissection plane may propagate to at least left subclavian takeoff, root above aortic valve okay     Chronic abdominal pain    Chronic combined systolic and diastolic congestive heart failure ([place])    a. 05/2011: Adm with pulm edema/HTN urgency, EF 35-40% with diffuse hypokinesis and moderate to severe mitral regurgitation. Cardiomyopathy likely due to uncontrolled HTN and ETOH abuse - cath deferred due to renal insufficiency (felt due to uncontrolled HTN). bJodie Echevaria MV 06/2011: EF 37% and no ischemia or infarction. c. EF 45-50% by echo 01/2012.   Chronic sinusitis    CKD  (chronic kidney disease)    dialysis m,t,th,fri   DDD (degenerative disc disease), lumbar    Depression    Descending thoracic aortic aneurysm (HCC)    Dissecting aneurysm of thoracic aorta (HCC)    ETOH abuse    a. Reported to have quit 05/2011.   Frequent headaches    GERD (gastroesophageal reflux disease)    Headache(784.0)    "q other day" (08/08/2013)   Heart murmur    Hemorrhoid thrombosis    History of echocardiogram    Echo 1/17:  Severe LVH, EF 55-60%, no RWMA, Gr 2 DD, AVR ok, mild to mod MR, mild LAE, mild reduced RVSF, mod RAE   History of medication noncompliance    HYPERLIPIDEMIA    Hypertension    a. Hx of HTN urgency secondary to noncompliance. b. urinary metanephrine and catecholeamine levels normal 2013.  c. Renal art [name] 1/16:  No evidence of renal artery stenosis noted bilaterally.   INGUINAL HERNIA    Pneumonia ~ 2013   Renal insufficiency    Serrated adenoma of colon 08/2015   Stroke [facility])    Tobacco abuse    Valvular heart disease    a. Echo 05/2011: moderate to severe eccentric MR and mild to moderate AI with prolapsing left coronary cusp. b. Echo 01/2012: mild-mod AI, mild dilitation of aortic root, mild MR.;  c. Echo 1/16:  Severe LVH consistent with hypertrophic cardio myopathy, EF 50%, no RWMA, mod AI, mild MR, mild RAE, dilated Ao root (40 mm);     Patient Active Problem List   Diagnosis Date Noted   Malnutrition of moderate degree 09/06/2020   ESRD needing dialysis ([place]) 09/06/2020   Cryptogenic stroke ([place]) 05/16/2020   History of loop recorder 02/13/2020   Chest pain 11/24/2019   COPD (chronic obstructive pulmonary disease) ([place]) 11/24/2019   Nonspecific chest pain    Chest pain, rule out acute myocardial infarction 31/49/7026   Chronic systolic (congestive) heart failure ([place]) 04/25/2018   Acute on chronic combined systolic and diastolic CHF (congestive heart failure) ([place]) 12/08/2017   Coronary artery disease involving native coronary artery of  native heart with angina pectoris ([place]) 12/06/2017   NSTEMI (non-ST elevated myocardial infarction) ([place]) 12/02/2017   Hypertensive emergency - Accelerated Hypertension 12/02/2017   History of stroke 10/05/2017   Somnolence, daytime 04/07/2017   Snoring 04/07/2017   Cerebral infarction due to embolism of left middle cerebral artery ([place]) 06/03/2016   History of aortic dissection 06/03/2016   Palpitation    Cerebrovascular accident (CVA) due to embolism of left middle cerebral artery ([place])    Seizures ([place]) 02/28/2016   HLD (hyperlipidemia) 08/28/2015   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[place]) 06/07/2015   Cardiomyopathy ([place]) 03/01/2015   Acid reflux 03/01/2015   Essential hypertension 03/01/2015   S/P aortic dissection repair 10/03/2014   H/O aortic valve replacement 10/03/2014   CKD (chronic kidney disease), stage IV ([place]) 05/15/2014   AKI (acute kidney injury) ([place])    Dissecting aneurysm of thoracic aorta, [place] type B 04/24/2014   Aortic dissection ([place]) 04/23/2014   Aneurysm of ascending aorta ([place]) 03/28/2014   Dyspnea 03/27/2014   ETOH abuse 09/20/2013   Malignant hypertension 02/20/2013   Hypertensive urgency 01/27/2012   Cardiomyopathy, hypertensive ([place]) 01/26/2012   AI (aortic insufficiency) 01/26/2012   MR (mitral regurgitation) 01/26/2012   Acute renal failure superimposed on stage 3 chronic kidney disease ([place]) 01/26/2012   Hypertensive cardiomyopathy, without heart failure ([place]): EF reduced to 35-40% from 55%. 06/10/2011   Chronic bronchitis ([place]) 05/23/2011   Cardiomegaly - hypertensive 05/22/2011   Marijuana use 05/22/2011   Abdominal pain 05/22/2011   Hypertension, malignant 05/22/2011   Anxiety and depression 05/22/2011   Hyperlipidemia LDL goal <70 08/26/2009   Tobacco use 08/26/2009   Headache(784.0) 08/26/2009   Aortic valve disorder 03/11/2009   INGUINAL HERNIA 02/18/2009   Uncontrolled hypertension 01/16/2009    Past Surgical History:   Procedure Laterality Date   ANKLE SURGERY Bilateral    Fractures bilaterally   AORTIC VALVE SURGERY  09/2014   AV FISTULA PLACEMENT Left 09/09/2020   Procedure: LEFT ARM ARTERIOVENOUS (AV) FISTULA CREATION;  Surgeon: Cherre Robins, MD;  Location: [facility];  Service: Vascular;  Laterality: Left;   [place] Left 10/09/2020   Procedure: LEFT SECOND STAGE BASILIC VEIN TRANSPOSITION;  Surgeon: Cherre Robins, MD;  Location: [facility];  Service: Vascular;  Laterality: Left;  PERIPHERAL NERVE BLOCK   CORONARY ANGIOGRAPHY N/A 12/06/2017   Procedure: CORONARY ANGIOGRAPHY;  Surgeon: Belva Crome, MD;  Location: [facility] CV LAB;  Service: Cardiovascular;  Laterality: N/A;   CORONARY ANGIOGRAPHY N/A 09/26/2018   Procedure: CORONARY ANGIOGRAPHY;  Surgeon: Nelva Bush, MD;  Location: [facility] CV LAB;  Service: Cardiovascular;  Laterality: N/A;   CORONARY STENT INTERVENTION N/A 12/10/2017   Procedure: CORONARY STENT INTERVENTION;  Surgeon: [name] Sine, MD;  Location: [facility] CV LAB;  Service: Cardiovascular;  Laterality: N/A;   FOOT FRACTURE SURGERY Bilateral 2004-2010   "got pins in both of them"   HEMORRHOID SURGERY N/A 06/15/2015   Procedure: HEMORRHOIDECTOMY;  Surgeon: Stark Klein, MD;  Location: [facility];  Service: General;  Laterality: N/A;   INGUINAL HERNIA REPAIR Right ~ [place] N/A 09/09/2020   Procedure: INSERTION OF TUNNELED DIALYSIS PALINDROME 19cm CATHETER;  Surgeon: Cherre Robins, MD;  Location: [facility];  Service: Vascular;  Laterality: N/A;   [place] N/A 06/21/2014   Procedure: LEFT HEART CATHETERIZATION WITH CORONARY ANGIOGRAM;  Surgeon: Larey Dresser, MD;  Location: [facility] CATH LAB;  Service: Cardiovascular;  Laterality: N/A;   LOOP RECORDER INSERTION N/A 06/19/2016   Procedure: Loop Recorder Insertion;  Surgeon: Deboraha Sprang, MD;  Location: [facility] CV LAB;  Service: Cardiovascular;   Laterality: N/A;   TEE WITHOUT CARDIOVERSION N/A 03/12/2016   Procedure: TRANSESOPHAGEAL ECHOCARDIOGRAM (TEE);  Surgeon: Sanda Klein, MD;  Location: [facility] ENDOSCOPY;  Service: Cardiovascular;  Laterality: N/A;   TEE WITHOUT CARDIOVERSION N/A 10/16/2020   Procedure: TRANSESOPHAGEAL ECHOCARDIOGRAM (TEE);  Surgeon: Larey Dresser, MD;  Location: [facility] ENDOSCOPY;  Service: Cardiovascular;  Laterality: N/A;       Family History  Problem Relation Age of Onset   Hypertension Mother    Hypertension Other    Colon cancer Paternal Uncle    Stroke Maternal Aunt    Heart attack Brother    Hypertension Brother    Diabetes Maternal Aunt    Lung cancer Maternal Uncle    Stroke Maternal Uncle    Other Sister        "breathing machine at night"   Headache Sister    Thyroid disease Sister    Throat cancer Neg Hx    Pancreatic cancer Neg Hx    Esophageal cancer Neg Hx    Kidney disease Neg Hx    Liver disease Neg Hx     Social History   Tobacco Use   Smoking status: Some Days    Packs/day: 0.25    Years: 23.00    Pack years: 5.75    Types: Cigarettes   Smokeless tobacco: Never   Tobacco comments:    3 to 4 per day  Substance Use Topics   Alcohol use: Not Currently    Alcohol/week: 1.0 standard drink    Types: 1 Cans of beer per week    Comment: On special occasion   Drug use: Yes    Types: Marijuana    Comment: 2 times/week - last use 10/06/20    Home Medications Prior to Admission medications   Medication Sig Start Date End Date Taking? Authorizing Provider  acetaminophen (TYLENOL) 325 MG tablet Take 650 mg by mouth every 6 (six) hours as needed for moderate pain (for pain or headaches).   Yes [provider]  albuterol (VENTOLIN HFA) 108 (90 Base) MCG/ACT inhaler Inhale 2 puffs into the lungs every 6 (six) hours as needed for wheezing or shortness of breath.   Yes [provider]  aspirin EC 81 MG EC tablet Take 1 tablet (81 mg total) by mouth daily. 12/12/17  Yes  [facility], Tami Lin, PA  atorvastatin (LIPITOR) 80 MG tablet Take 1 tablet (80 mg total) by mouth daily. 05/19/20  Yes Georgette Shell, MD  carvedilol (COREG) 25 MG tablet Take 25 mg by mouth 2 (two) times daily with a meal.  Yes [provider]  hydrALAZINE (APRESOLINE) 50 MG tablet Take 1.5 tablets (75 mg total) by mouth 3 (three) times daily. 10/02/20 11/25/21 Yes [place], Maricela Bo, FNP  HYDROcodone-acetaminophen (NORCO/VICODIN) 5-325 MG tablet Take 1 tablet by mouth every 4 (four) hours as needed. 01/04/21  Yes Isla Pence, MD  isosorbide mononitrate (IMDUR) 30 MG 24 hr tablet Take 1 tablet (30 mg total) by mouth daily. 09/12/20 11/25/21 Yes Donne Hazel, MD  oxyCODONE-acetaminophen (PERCOCET/ROXICET) 5-325 MG tablet Take 1 tablet by mouth every 6 (six) hours as needed. 10/09/20  Yes Ulyses Amor, PA-C  pantoprazole (PROTONIX) 40 MG tablet Take 1 tablet (40 mg total) by mouth daily. 11/28/19  Yes Larey Dresser, MD  STIOLTO RESPIMAT 2.5-2.5 MCG/ACT AERS INHALE 2 PUFFS INTO THE LUNGS DAILY Patient taking differently: Inhale 2 puffs into the lungs daily as needed (For shortness of breath). 09/06/19  Yes Spero Geralds, MD  Blood Pressure Monitor KIT 1 Device by Does not apply route 2 (two) times daily. 11/29/18   Kerin Perna, NP  Evolocumab (REPATHA SURECLICK) 638 MG/ML SOAJ Inject 1 Dose into the skin every 14 (fourteen) days. Patient not taking: No sig reported 11/29/19   Pixie Casino, MD    Allergies    Imdur [isosorbide nitrate] and Tramadol  Review of Systems   Review of Systems  Musculoskeletal:        Left upper arm pain  All other systems reviewed and are negative.  Physical Exam Updated Vital Signs BP (!) 176/93   Pulse 77   Temp 98.9 F (37.2 C) (Oral)   Resp 16   Ht _0  (1.803 m)   Wt 70.8 kg   SpO2 96%   BMI 21.76 kg/m   Physical Exam Vitals and nursing note reviewed.  Constitutional:      Appearance: Normal appearance.  HENT:      Head: Normocephalic and atraumatic.     Right Ear: External ear normal.     Left Ear: External ear normal.     Nose: Nose normal.     Mouth/Throat:     Mouth: Mucous membranes are moist.     Pharynx: Oropharynx is clear.  Eyes:     Extraocular Movements: Extraocular movements intact.     Conjunctiva/sclera: Conjunctivae normal.     Pupils: Pupils are equal, round, and reactive to light.  Cardiovascular:     Rate and Rhythm: Normal rate and regular rhythm.     Pulses: Normal pulses.     Heart sounds: Normal heart sounds.  Pulmonary:     Effort: Pulmonary effort is normal.     Breath sounds: Normal breath sounds.  Chest:     Comments: Port upper right chest Abdominal:     General: Abdomen is flat. Bowel sounds are normal.     Palpations: Abdomen is soft.  Musculoskeletal:     Cervical back: Normal range of motion and neck supple.     Comments: Left upper arm AVF with infiltration.  Tender to touch.  Skin:    General: Skin is warm.     Capillary Refill: Capillary refill takes less than 2 seconds.  Neurological:     General: No focal deficit present.     Mental Status: He is alert and oriented to person, place, and time.  Psychiatric:        Mood and Affect: Mood normal.        Behavior: Behavior normal.    ED Results / Procedures /  Treatments   Labs (all labs ordered are listed, but only abnormal results are displayed) Labs Reviewed  I-STAT CHEM 8, ED    EKG None  Radiology No results found.  Procedures Procedures   Medications Ordered in ED Medications  morphine 4 MG/ML injection 4 mg (4 mg Intramuscular Given 01/04/21 1503)  morphine 4 MG/ML injection 4 mg (4 mg Intramuscular Given 01/04/21 1701)    ED Course  I have reviewed the triage vital signs and the nursing notes.  Pertinent labs & imaging results that were available during my care of the patient were reviewed by me and considered in my medical decision making (see chart for details).    MDM  Rules/Calculators/A&P                           Pt d/w Dr. Scot Dock (vascular).  He recommends ice and elevation and to not use it for about a month and see how it is doing then.  Potassium (3.4) is normal.  Pt has a port that can be  used for dialysis.  Pt is stable for d/c.  Go to dialysis as scheduled on Tuesday.  Return if worse.  F/u with pcp/vascular.  Final Clinical Impression(s) / ED Diagnoses Final diagnoses:  Complication of AV dialysis fistula, initial encounter    Rx / DC Orders ED Discharge Orders          Ordered    HYDROcodone-acetaminophen (NORCO/VICODIN) 5-325 MG tablet  Every 4 hours PRN        01/04/21 1709             Isla Pence, MD 01/04/21 1711

## 2021-01-05 LAB — I-STAT CHEM 8, ED
BUN: 27 mg/dL — ABNORMAL HIGH (ref 6–20)
Calcium, Ion: 1.02 mmol/L — ABNORMAL LOW (ref 1.15–1.40)
Chloride: 94 mmol/L — ABNORMAL LOW (ref 98–111)
Creatinine, Ser: 5.9 mg/dL — ABNORMAL HIGH (ref 0.61–1.24)
Glucose, Bld: 76 mg/dL (ref 70–99)
HCT: 36 % — ABNORMAL LOW (ref 39.0–52.0)
Hemoglobin: 12.2 g/dL — ABNORMAL LOW (ref 13.0–17.0)
Potassium: 3.4 mmol/L — ABNORMAL LOW (ref 3.5–5.1)
Sodium: 137 mmol/L (ref 135–145)
TCO2: 30 mmol/L (ref 22–32)

## 2021-01-11 ENCOUNTER — Emergency Department (HOSPITAL_COMMUNITY)
Admission: EM | Admit: 2021-01-11 | Discharge: 2021-01-11 | Disposition: A | Discharge status: Home / Self Care | Payer: Medicare Other | Attending: Emergency Medicine | Admitting: Emergency Medicine

## 2021-01-11 ENCOUNTER — Other Ambulatory Visit: Payer: Self-pay

## 2021-01-11 ENCOUNTER — Emergency Department (HOSPITAL_COMMUNITY): Payer: Medicare Other

## 2021-01-11 ENCOUNTER — Encounter (HOSPITAL_COMMUNITY): Payer: Self-pay | Admitting: Emergency Medicine

## 2021-01-11 DIAGNOSIS — M79602 Pain in left arm: Secondary | ICD-10-CM | POA: Diagnosis present

## 2021-01-11 DIAGNOSIS — Z5321 Procedure and treatment not carried out due to patient leaving prior to being seen by health care provider: Secondary | ICD-10-CM | POA: Diagnosis not present

## 2021-01-11 LAB — CBC
HCT: 31.2 % — ABNORMAL LOW (ref 39.0–52.0)
Hemoglobin: 10.6 g/dL — ABNORMAL LOW (ref 13.0–17.0)
MCH: 33.7 pg (ref 26.0–34.0)
MCHC: 34 g/dL (ref 30.0–36.0)
MCV: 99 fL (ref 80.0–100.0)
Platelets: 156 10*3/uL (ref 150–400)
RBC: 3.15 MIL/uL — ABNORMAL LOW (ref 4.22–5.81)
RDW: 13.1 % (ref 11.5–15.5)
WBC: 5.1 10*3/uL (ref 4.0–10.5)
nRBC: 0 % (ref 0.0–0.2)

## 2021-01-11 LAB — BASIC METABOLIC PANEL
Anion gap: 11 (ref 5–15)
BUN: 31 mg/dL — ABNORMAL HIGH (ref 6–20)
CO2: 28 mmol/L (ref 22–32)
Calcium: 9.4 mg/dL (ref 8.9–10.3)
Chloride: 98 mmol/L (ref 98–111)
Creatinine, Ser: 7.96 mg/dL — ABNORMAL HIGH (ref 0.61–1.24)
GFR, Estimated: 8 mL/min — ABNORMAL LOW (ref >60)
Glucose, Bld: 108 mg/dL — ABNORMAL HIGH (ref 70–99)
Potassium: 4 mmol/L (ref 3.5–5.1)
Sodium: 137 mmol/L (ref 135–145)

## 2021-01-11 LAB — TROPONIN I (HIGH SENSITIVITY): Troponin I (High Sensitivity): 35 ng/L — ABNORMAL HIGH (ref <18)

## 2021-01-11 NOTE — ED Notes (Signed)
Pt leaving ED and stated he wanted to go to [facility] instead.

## 2021-01-11 NOTE — ED Provider Notes (Signed)
Emergency Medicine Provider Triage Evaluation Note  [name] , a 50 y.o. male  was evaluated in triage.  Pt complains of left arm pain at his fistula after being stuck multiple times at dialysis on Thursday. Patient endorses numbness and tingling in left arm, radiating pain up and down the arm, swelling and bruising of the arm. Patient endorses radiation into chest. History of ACS, known murmur. No SOB, N/V, diaphoresis.  Review of Systems  Positive: As above Negative: As above  Physical Exam  BP (!) 152/86 (BP Location: Right Arm)   Pulse 94   Temp 98.4 F (36.9 C) (Oral)   Resp 18   SpO2 97%  Gen:   Awake, no distress  Resp:  Normal effort  MSK:   Moves extremities without difficulty  Other:  Palpable thrill noted left arm, no redness or purulent drainage, arm is very TTP, some bruising noted  Medical Decision Making  Medically screening exam initiated at 11:11 AM.  Appropriate orders placed.  [name] was informed that the remainder of the evaluation will be completed by another provider, this initial triage assessment does not replace that evaluation, and the importance of remaining in the ED until their evaluation is complete.  Chest pain, fistula pain / issue   Anselmo Pickler, PA-C 01/11/21 1114    Tegeler, Gwenyth Allegra, MD 01/11/21 1131

## 2021-01-11 NOTE — ED Triage Notes (Signed)
Pt reports pain and swelling to L upper arm fistula since dialysis on 10/22.  States he is taking pain medication without relief.  Seen in ED on 10/22 for same.

## 2021-01-13 ENCOUNTER — Encounter (HOSPITAL_COMMUNITY): Payer: Medicare Other

## 2021-01-14 ENCOUNTER — Other Ambulatory Visit: Payer: Self-pay

## 2021-01-14 ENCOUNTER — Emergency Department (HOSPITAL_COMMUNITY)
Admission: EM | Admit: 2021-01-14 | Discharge: 2021-01-14 | Disposition: A | Discharge status: Home / Self Care | Payer: Medicare Other | Attending: Emergency Medicine | Admitting: Emergency Medicine

## 2021-01-14 ENCOUNTER — Emergency Department (HOSPITAL_COMMUNITY): Payer: Medicare Other

## 2021-01-14 DIAGNOSIS — Z5321 Procedure and treatment not carried out due to patient leaving prior to being seen by health care provider: Secondary | ICD-10-CM | POA: Diagnosis not present

## 2021-01-14 DIAGNOSIS — Y832 Surgical operation with anastomosis, bypass or graft as the cause of abnormal reaction of the patient, or of later complication, without mention of misadventure at the time of the procedure: Secondary | ICD-10-CM | POA: Diagnosis not present

## 2021-01-14 DIAGNOSIS — T82848A Pain from vascular prosthetic devices, implants and grafts, initial encounter: Secondary | ICD-10-CM | POA: Diagnosis present

## 2021-01-14 DIAGNOSIS — Z20822 Contact with and (suspected) exposure to covid-19: Secondary | ICD-10-CM | POA: Insufficient documentation

## 2021-01-14 LAB — BASIC METABOLIC PANEL
Anion gap: 9 (ref 5–15)
BUN: 47 mg/dL — ABNORMAL HIGH (ref 6–20)
CO2: 24 mmol/L (ref 22–32)
Calcium: 9.4 mg/dL (ref 8.9–10.3)
Chloride: 106 mmol/L (ref 98–111)
Creatinine, Ser: 9.54 mg/dL — ABNORMAL HIGH (ref 0.61–1.24)
GFR, Estimated: 6 mL/min — ABNORMAL LOW (ref >60)
Glucose, Bld: 83 mg/dL (ref 70–99)
Potassium: 4.7 mmol/L (ref 3.5–5.1)
Sodium: 139 mmol/L (ref 135–145)

## 2021-01-14 LAB — CBC WITH DIFFERENTIAL/PLATELET
Abs Immature Granulocytes: 0.01 10*3/uL (ref 0.00–0.07)
Basophils Absolute: 0 10*3/uL (ref 0.0–0.1)
Basophils Relative: 1 %
Eosinophils Absolute: 0.1 10*3/uL (ref 0.0–0.5)
Eosinophils Relative: 3 %
HCT: 30.6 % — ABNORMAL LOW (ref 39.0–52.0)
Hemoglobin: 9.9 g/dL — ABNORMAL LOW (ref 13.0–17.0)
Immature Granulocytes: 0 %
Lymphocytes Relative: 25 %
Lymphs Abs: 1.2 10*3/uL (ref 0.7–4.0)
MCH: 32.9 pg (ref 26.0–34.0)
MCHC: 32.4 g/dL (ref 30.0–36.0)
MCV: 101.7 fL — ABNORMAL HIGH (ref 80.0–100.0)
Monocytes Absolute: 0.5 10*3/uL (ref 0.1–1.0)
Monocytes Relative: 10 %
Neutro Abs: 3 10*3/uL (ref 1.7–7.7)
Neutrophils Relative %: 61 %
Platelets: 123 10*3/uL — ABNORMAL LOW (ref 150–400)
RBC: 3.01 MIL/uL — ABNORMAL LOW (ref 4.22–5.81)
RDW: 13.4 % (ref 11.5–15.5)
WBC: 4.9 10*3/uL (ref 4.0–10.5)
nRBC: 0 % (ref 0.0–0.2)

## 2021-01-14 LAB — RESP PANEL BY RT-PCR (FLU A&B, COVID) ARPGX2
Influenza A by PCR: NEGATIVE
Influenza B by PCR: NEGATIVE
SARS Coronavirus 2 by RT PCR: NEGATIVE

## 2021-01-14 MED ORDER — HYDROCODONE-ACETAMINOPHEN 5-325 MG PO TABS
1.0000 | ORAL_TABLET | Freq: Once | ORAL | Status: AC
  Administered 2021-01-14: 1 via ORAL
  Filled 2021-01-14: qty 1

## 2021-01-14 NOTE — ED Provider Notes (Signed)
Emergency Medicine Provider Triage Evaluation Note  [name] , a 50 y.o. male  was evaluated in triage.  Pt complains of left upper arm pain that has been ongoing for over a week.  Was seen here for similar symptoms in the past.  Has an AV fistula in the left upper extremity that was stuck multiple times resulting in a hematoma.  He states that swelling is better.  He denies any chest pain, shortness of breath, fever, chills.  Review of Systems  Positive:  Negative: See above   Physical Exam  BP (!) 174/94 (BP Location: Right Arm)   Pulse 77   Temp 98.4 F (36.9 C) (Oral)   Resp 18   SpO2 100%  Gen:   Awake, no distress   Resp:  Normal effort  MSK:   Moves extremities without difficulty  Other:  4/6 systolic murmur heard throughout the entire precordium  Medical Decision Making  Medically screening exam initiated at 12:49 PM.  Appropriate orders placed.  ISIDOR BROMELL was informed that the remainder of the evaluation will be completed by another provider, this initial triage assessment does not replace that evaluation, and the importance of remaining in the ED until their evaluation is complete.     Hendricks Limes, PA-C 01/14/21 1251    Truddie Hidden, MD 01/14/21 [phone]

## 2021-01-14 NOTE — ED Triage Notes (Addendum)
Pt here POV with fissula issue. New fissula placed 3 months ago. Pt states he went to HD Oct 25 for second Tx. Had trouble obtaining access. Completed treatment. Arm became swollen, red and painful to touch. Positive Thrill palpated. 10/10 pain. Limited movement of left arm d/t pain and swelling. Missed HD today.

## 2021-01-14 NOTE — ED Notes (Signed)
Registration gave me this patient labels and stated this patient left

## 2021-01-29 ENCOUNTER — Other Ambulatory Visit: Payer: Self-pay

## 2021-01-29 ENCOUNTER — Ambulatory Visit (HOSPITAL_COMMUNITY)
Admission: RE | Admit: 2021-01-29 | Discharge: 2021-01-29 | Disposition: A | Discharge status: Home / Self Care | Payer: Medicare Other | Source: Ambulatory Visit | Attending: Cardiology | Admitting: Cardiology

## 2021-01-29 ENCOUNTER — Other Ambulatory Visit (HOSPITAL_COMMUNITY): Payer: Self-pay | Admitting: *Deleted

## 2021-01-29 ENCOUNTER — Encounter (HOSPITAL_COMMUNITY): Payer: Self-pay | Admitting: Cardiology

## 2021-01-29 VITALS — BP 164/90 | HR 78 | Wt 155.4 lb

## 2021-01-29 DIAGNOSIS — I5042 Chronic combined systolic (congestive) and diastolic (congestive) heart failure: Secondary | ICD-10-CM | POA: Diagnosis present

## 2021-01-29 DIAGNOSIS — I25118 Atherosclerotic heart disease of native coronary artery with other forms of angina pectoris: Secondary | ICD-10-CM | POA: Insufficient documentation

## 2021-01-29 DIAGNOSIS — Z953 Presence of xenogenic heart valve: Secondary | ICD-10-CM | POA: Diagnosis not present

## 2021-01-29 DIAGNOSIS — F17210 Nicotine dependence, cigarettes, uncomplicated: Secondary | ICD-10-CM | POA: Diagnosis not present

## 2021-01-29 DIAGNOSIS — I2510 Atherosclerotic heart disease of native coronary artery without angina pectoris: Secondary | ICD-10-CM

## 2021-01-29 DIAGNOSIS — E785 Hyperlipidemia, unspecified: Secondary | ICD-10-CM | POA: Diagnosis not present

## 2021-01-29 DIAGNOSIS — Z8673 Personal history of transient ischemic attack (TIA), and cerebral infarction without residual deficits: Secondary | ICD-10-CM | POA: Diagnosis not present

## 2021-01-29 DIAGNOSIS — I252 Old myocardial infarction: Secondary | ICD-10-CM | POA: Diagnosis not present

## 2021-01-29 DIAGNOSIS — Z992 Dependence on renal dialysis: Secondary | ICD-10-CM | POA: Diagnosis not present

## 2021-01-29 DIAGNOSIS — N186 End stage renal disease: Secondary | ICD-10-CM | POA: Diagnosis not present

## 2021-01-29 DIAGNOSIS — I359 Nonrheumatic aortic valve disorder, unspecified: Secondary | ICD-10-CM | POA: Diagnosis not present

## 2021-01-29 DIAGNOSIS — I132 Hypertensive heart and chronic kidney disease with heart failure and with stage 5 chronic kidney disease, or end stage renal disease: Secondary | ICD-10-CM | POA: Diagnosis not present

## 2021-01-29 DIAGNOSIS — I208 Other forms of angina pectoris: Secondary | ICD-10-CM | POA: Diagnosis not present

## 2021-01-29 DIAGNOSIS — R079 Chest pain, unspecified: Secondary | ICD-10-CM

## 2021-01-29 LAB — BASIC METABOLIC PANEL
Anion gap: 11 (ref 5–15)
BUN: 22 mg/dL — ABNORMAL HIGH (ref 6–20)
CO2: 29 mmol/L (ref 22–32)
Calcium: 9.4 mg/dL (ref 8.9–10.3)
Chloride: 96 mmol/L — ABNORMAL LOW (ref 98–111)
Creatinine, Ser: 6.16 mg/dL — ABNORMAL HIGH (ref 0.61–1.24)
GFR, Estimated: 10 mL/min — ABNORMAL LOW (ref >60)
Glucose, Bld: 81 mg/dL (ref 70–99)
Potassium: 3.8 mmol/L (ref 3.5–5.1)
Sodium: 136 mmol/L (ref 135–145)

## 2021-01-29 LAB — CBC
HCT: 33.6 % — ABNORMAL LOW (ref 39.0–52.0)
Hemoglobin: 10.6 g/dL — ABNORMAL LOW (ref 13.0–17.0)
MCH: 32 pg (ref 26.0–34.0)
MCHC: 31.5 g/dL (ref 30.0–36.0)
MCV: 101.5 fL — ABNORMAL HIGH (ref 80.0–100.0)
Platelets: 148 10*3/uL — ABNORMAL LOW (ref 150–400)
RBC: 3.31 MIL/uL — ABNORMAL LOW (ref 4.22–5.81)
RDW: 14.2 % (ref 11.5–15.5)
WBC: 3.9 10*3/uL — ABNORMAL LOW (ref 4.0–10.5)
nRBC: 0 % (ref 0.0–0.2)

## 2021-01-29 MED ORDER — AMLODIPINE BESYLATE 5 MG PO TABS: 5.0000 mg | ORAL_TABLET | Freq: Every day | ORAL | 6 refills | Status: DC

## 2021-01-29 NOTE — Patient Instructions (Signed)
Start taking your amoldipine 5 mg Daily.  Labs done today, your results will be available in MyChart, we will contact you for abnormal readings.  Your physician recommends that you schedule a follow-up appointment in: 1 month.  Your physician has requested that you have a cardiac catheterization. Cardiac catheterization is used to diagnose and/or treat various heart conditions. Doctors may recommend this procedure for a number of different reasons. The most common reason is to evaluate chest pain. Chest pain can be a symptom of coronary artery disease (CAD), and cardiac catheterization can show whether plaque is narrowing or blocking your heart's arteries. This procedure is also used to evaluate the valves, as well as measure the blood flow and oxygen levels in different parts of your heart. For further information please visit [email or web address]. Please follow instruction sheet, as given.  If you have any questions or concerns before your next appointment please send [name] a message through [place] or call our office at [phone].    TO LEAVE A MESSAGE FOR THE NURSE SELECT OPTION 2, PLEASE LEAVE A MESSAGE INCLUDING: YOUR NAME DATE OF BIRTH CALL BACK NUMBER REASON FOR CALL**this is important as we prioritize the call backs  YOU WILL RECEIVE A CALL BACK THE SAME DAY AS LONG AS YOU CALL BEFORE 4:00 PM  At the [facility], you and your health needs are our priority. As part of our continuing mission to provide you with exceptional heart care, we have created designated Provider Care Teams. These Care Teams include your primary Cardiologist (physician) and Advanced Practice Providers (APPs- Physician Assistants and Nurse Practitioners) who all work together to provide you with the care you need, when you need it.   You may see any of the following providers on your designated Care Team at your next follow up: Dr Glori Bickers Dr Haynes Kerns, NP Lyda Jester,  [place] [facility] [place], [place] Audry Riles, PharmD   Please be sure to bring in all your medications bottles to every appointment.     You are scheduled for a Cardiac Catheterization on Wednesday, November 23 with Dr. [name].  1. Please arrive at the [facility] (Main Entrance A) at [facility]: [address] [place], [place] [address] at 10:00 AM (This time is two hours before your procedure to ensure your preparation). Free valet parking service is available.   Special note: Every effort is made to have your procedure done on time. Please understand that emergencies sometimes delay scheduled procedures.  2. Diet: Do not eat solid foods after midnight.  The patient may have clear liquids until 5am upon the day of the procedure.  3. Labs: done today.  4. Medication instructions in preparation for your procedure:   Contrast Allergy: No   On the morning of your procedure, take your Aspirin and any morning medicines NOT listed above.  You may use sips of water.  5. Plan for one night stay--bring personal belongings. 6. Bring a current list of your medications and current insurance cards. 7. You MUST have a responsible person to drive you home. 8. Someone MUST be with you the first 24 hours after you arrive home or your discharge will be delayed. 9. Please wear clothes that are easy to get on and off and wear slip-on shoes.  Thank you for allowing [name] to care for you!   -- [place] [facility]

## 2021-01-29 NOTE — Progress Notes (Signed)
Date:  01/29/2021   ID:  [name], DOB August 16, 1970, MRN [id number]   Provider location: [facility] Type of Visit: Established patient   PCP:  Pcp, No  Cardiologist:  Dr. Aundra Dubin   History of Present Illness: [name] is a 50 y.o. male who has a history of CAD, NSTEMI 11/2017,  smoking, CKD Stage III, HTN, AAA repair 2016 at [facility] , open repair of TAAA secondary to chronic dissection with multibranch dacron graft 12/2016, CVA 2017, loop recorder, bioprothetic AVR 2016, HLD.    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  The descending thoracic aorta has dilated to 5.1 cm.  Cardiac catheterization in April 2016 demonstrated 60% first diagonal lesion.  He underwent aortic valve replacement with bovine pericardial tissue valve and reconstruction along with aortic root replacement and endovascular repair of the descending thoracic aortic aneurysm at [facility].  He has had intermittent chest discomfort since then.   Admitted in 12/02/2017 chest pain. Had NSTEMI. He had LHC with stent placed proximal ramus.  Repeat cath in 7/20 with nonobstructive disease.   He was admitted in 9/21 with atypical chest pain.  Echo showed EF 60-65% with severe LVH, normal RV, bioprosthetic aortic valve with mean gradient 18 mmHg.  Cardiolite showed no ischemia.   Echoes in 3/22 and 6/22 showed hyperdynamic LV with aortic valve mean gradient > 30. Therefore, TEE was done in 8/22 to assess the bioprosthetic aortic valve.  This showed EF 60-65%, severe LVH, normal RV, bioprosthetic AoV that opened with minimal restriction, mean gradient 20 mmHg in setting of anesthesia and BP control, no AI.  Suspect small/undersized aortic valve.   Patient now has ESRD and is on dialysis.    He presents for followup of CAD, HTN.  He still smokes but down to a  few cigarettes/day. He reports steadily worsening chest pain over the last few months.  He gets central chest aching with exertion only, it resolves with rest.  It is happening daily, he will get chest pain after walking about 50 yards and will have to stop.  No chest pain at rest.  He does not take NTG.  No significant dyspnea.  No orthopnea/PND.  No lightheadedness/syncope. BP is elevated today.    ECG (personally reviewed): NSR, LVH with repolarization abnormality.    Labs (10/19): K 4.1, creatinine 1.78, myeloma panel negative Labs (2/20): K 4.2, creatinine 1.75, LDL 128 Labs (7/21): LDL 66 Labs (9/21): K 4.1, creatinine 3.31, hgb 11.9 Labs (10/21): K 4, creatinine 2.96, hgb 12.8 Labs (3/22): LDL 55   Review of Systems: All systems reviewed and negative except as per HPI.    PMH: 1. H/o ankle fractures bilaterally 2. GERD 3. HTN: Negative urinary catecholeamine collection for pheochromocytoma.  - Renal artery dopplers (3/22): Not suggestive of significant renal artery stenosis.  4. ESRD: Suspect hypertensive nephropathy.  5. Cardiomyopathy: Most likely due to heavy ETOH ingestion and uncontrolled HTN.   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  Lexiscan myoview (4/13): EF 37%, global hypokinesis, no ischemic or infarction.  Echo (12/14) with EF 45-50%, diffuse hypokinesis, moderate LVH, mild MR, moderate AI.  Echo (4/16) with EF 55-60%, severe LVH, mild  LV dilation, moderate AI and moderate MR.   - Echo (9/19): EF 35-40% with diffuse hypokinesis, severe LVH, bioprosthetic aortic valve functioning normally.  - PYP scan (1/20): No evidence for TTR amyloidosis.  - Echo (9/21): EF 60-65%, severe LVH, normal RV, bioprosthetic aortic valve with mean gradient 18 mmHg.  - TEE (8/22): EF 60-65%, severe LVH, normal RV, bioprosthetic AoV that opened with minimal restriction,  mean gradient 20 mmHg in setting of anesthesia and BP control, no AI.  Suspect small/undersized aortic valve. 6. ETOH abuse: Quit 3/13.  7. Active smoker. 8. Valvular heart disease: Echo in 3/13 showed moderate to severe eccentric MR and mild to moderate AI with prolapsing left coronary cusp. Echo 12/14 with mild MR and moderate AI.  Echo 4/16 with moderate AI and moderate MR.  - S/p Bentall procedure with bioprosthetic aortic valve 9/16.  - Echoes in 3/22 and 6/22 showed hyperdynamic LV with bioprosthetic aortic valve with mean gradient > 30.  - TEE (8/22): EF 60-65%, severe LVH, normal RV, bioprosthetic AoV that opened with minimal restriction, mean gradient 20 mmHg in setting of anesthesia and BP control, no AI.  Suspect small/undersized aortic valve. 9.  CVA: Has ILR. 10. Type B aortic dissection: Occurred in 2/16, from just distal to the left subclavian.  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    - 10/03/2014 First stage median sternotomy with right axillary cannulation for aortic valved conduit root replacement with coronary reconstruction (button Bentall Procedure), 27 mm Sorin Mitroflow bovine pericardial valved conduit, total arch replacement (89m Vascutek BSomaliamultibranch arch graft with individual head vessel reimplantation. - Status post  open repair of an Extent III TAA secondary to chronic dissection with a # 273mVascutek "Coselli" multibranch Dacron graft with individual celiac, SMA, and bilateral renal artery implantation on 01/04/2017, [facility].  11. CAD: LHC (4/16) with 60% ostial D1.   - NSTEMI 9/19: LHC (9/19) showed 50% proximal LCx, 90% ramus => DES to ramus.  - LHC (7/20): D1 60% stenosis, patent ramus stent.  - Cardiolite (9/21): Inferior fixed defect, no ischemia.  12. Sleep study (10/21) with no significant OSA.   Current Outpatient Medications  Medication Sig Dispense Refill   acetaminophen (TYLENOL) 325 MG tablet Take 650 mg by mouth every 6  (six) hours as needed for moderate pain (for pain or headaches).     albuterol (VENTOLIN HFA) 108 (90 Base) MCG/ACT inhaler Inhale 2 puffs into the lungs every 6 (six) hours as needed for wheezing or shortness of breath.     amLODipine (NORVASC) 5 MG tablet Take 1 tablet (5 mg total) by mouth daily. 30 tablet 6   aspirin EC 81 MG EC tablet Take 1 tablet (81 mg total) by mouth daily. 90 tablet 3   atorvastatin (LIPITOR) 80 MG tablet Take 1 tablet (80 mg total) by mouth daily. 30 tablet 2   Blood Pressure Monitor KIT 1 Device by Does not apply route 2 (two) times daily. 1 kit 0   carvedilol (COREG) 25 MG tablet Take 25 mg by mouth 2 (two) times daily with a meal.     hydrALAZINE (APRESOLINE) 50 MG tablet Take 1.5 tablets (75 mg total) by mouth 3 (three) times daily. 135 tablet 1   HYDROcodone-acetaminophen (NORCO/VICODIN) 5-325 MG tablet Take 1 tablet by mouth every 4 (four) hours as needed. 10 tablet 0   pantoprazole (PROTONIX) 40 MG tablet Take 1 tablet (40 mg total) by mouth daily. 30 tablet 6   STIOLTO  RESPIMAT 2.5-2.5 MCG/ACT AERS INHALE 2 PUFFS INTO THE LUNGS DAILY 4 g 5   Evolocumab (REPATHA SURECLICK) 379 MG/ML SOAJ Inject 1 Dose into the skin every 14 (fourteen) days. (Patient not taking: No sig reported) 2 mL 11   No current facility-administered medications for this encounter.    Allergies:   Imdur [isosorbide nitrate] and Tramadol   Social History:  The patient  reports that he has been smoking cigarettes. He has a 5.75 pack-year smoking history. He has never used smokeless tobacco. He reports that he does not currently use alcohol after a past usage of about 1.0 standard drink per week. He reports current drug use. Drug: Marijuana.   Family History:  The patient's family history includes Colon cancer in his paternal uncle; Diabetes in his maternal aunt; Headache in his sister; Heart attack in his brother; Hypertension in his brother, mother, and another family member; Lung cancer in  his maternal uncle; Other in his sister; Stroke in his maternal aunt and maternal uncle; Thyroid disease in his sister.   ROS:  Please see the history of present illness.   All other systems are personally reviewed and negative.   Exam:   BP (!) 164/90   Pulse 78   Wt 70.5 kg (155 lb 6.4 oz)   SpO2 100%   BMI 21.67 kg/m  General: NAD Neck: No JVD, no thyromegaly or thyroid nodule.  Lungs: Clear to auscultation bilaterally with normal respiratory effort. CV: Nondisplaced PMI.  Heart regular S1/S2, no S3/S4, 2/6 early SEM RUSB.  No peripheral edema.  No carotid bruit.  Normal pedal pulses.  Abdomen: Soft, nontender, no hepatosplenomegaly, no distention.  Skin: Intact without lesions or rashes.  Neurologic: Alert and oriented x 3.  Psych: Normal affect. Extremities: No clubbing or cyanosis.  HEENT: Normal.    Recent Labs: 07/23/2020: B Natriuretic Peptide 334.1 09/05/2020: ALT 14; TSH 3.472 01/29/2021: BUN 22; Creatinine, Ser 6.16; Hemoglobin 10.6; Platelets 148; Potassium 3.8; Sodium 136  Personally reviewed   Wt Readings from Last 3 Encounters:  01/29/21 70.5 kg (155 lb 6.4 oz)  01/04/21 70.8 kg (156 lb)  11/25/20 70.1 kg (154 lb 9.6 oz)      ASSESSMENT AND PLAN:  1. CAD: 9/19 NSTEMI with PCI to proximal ramus.  LHC in 7/20 with no obstructive disease.  Admission in 9/21 with Cardiolite showing no ischemia.  He has had long-standing atypical chest pain, but over the last few months, he has developed progressive exertional chest pain resolving with rest that is reproducible after walking about 50 yards.  This is quite concerning for coronary ischemia.  - Continue ASA 81.  - He has been on atorvastatin and Repatha, but has been out of Repatha.  I will contact lipid clinic to get him started back on it.  - With typical angina that has been progressive, I think that he needs coronary angiography.  I discussed risks/benefits and he agrees to procedure.   2. 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 Myeloma panel negative and PYP scan negative, suspect due more likely to HTN than hypertrophic cardiomyopathy.  He is not volume overloaded on exam and is not using Lasix.  Cannot get cardiac MRI with elevated creatinine.  Most recent study was a TEE in 8/22 showing EF 60-65% with severe LVH.  The LVH is likely due to long-standing HTN.  NYHA class II symptoms.  - Continue Coreg 25 mg  bid.  3. HTN: Still needs better control.  Renal artery dopplers in 3/22 were not suggestive of significant renal artery stenosis.  Sleep study in 10/21 was negative.  - Continue Coreg 25 mg bid.  - Continue hydralazine 75 mg tid.  - Start amlodipine 5 mg daily.  4. S/P Bioprothetic AVR: Echoes in 3/22 and 6/22 showed hyperdynamic LV with mean aortic valve gradient > 30.  TEE was done to investigate.  This showed bioprosthetic AoV that opened with minimal restriction, mean gradient 20 mmHg in setting of anesthesia and BP control, no AI.  Suspect small/undersized aortic valve.  5. S/P 2016 and 2018 thoracic aortic aneurysm repair: Followed by Dr. [name] at [facility].  - He has not been seen at [facility] for several years, needs followup of aorta => will try to arrange appt for him.  6. ESRD: Think this was due to controlled HTN.  7. Active smoker: Still smoking.  Has to quit before he can get a renal transplant.   Followup in 1 month with APP.    Signed, [name], MD  01/29/2021  [facility] [address] [facility] [place] [address] [phone] (office) [phone] (fax)

## 2021-01-29 NOTE — H&P (View-Only) (Signed)
Date:  01/29/2021   ID:  [name], DOB August 16, 1970, MRN [id number]   Provider location: [facility] Type of Visit: Established patient   PCP:  Pcp, No  Cardiologist:  Dr. Aundra Dubin   History of Present Illness: [name] is a 50 y.o. male who has a history of CAD, NSTEMI 11/2017,  smoking, CKD Stage III, HTN, AAA repair 2016 at [facility] , open repair of TAAA secondary to chronic dissection with multibranch dacron graft 12/2016, CVA 2017, loop recorder, bioprothetic AVR 2016, HLD.    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  The descending thoracic aorta has dilated to 5.1 cm.  Cardiac catheterization in April 2016 demonstrated 60% first diagonal lesion.  He underwent aortic valve replacement with bovine pericardial tissue valve and reconstruction along with aortic root replacement and endovascular repair of the descending thoracic aortic aneurysm at [facility].  He has had intermittent chest discomfort since then.   Admitted in 12/02/2017 chest pain. Had NSTEMI. He had LHC with stent placed proximal ramus.  Repeat cath in 7/20 with nonobstructive disease.   He was admitted in 9/21 with atypical chest pain.  Echo showed EF 60-65% with severe LVH, normal RV, bioprosthetic aortic valve with mean gradient 18 mmHg.  Cardiolite showed no ischemia.   Echoes in 3/22 and 6/22 showed hyperdynamic LV with aortic valve mean gradient > 30. Therefore, TEE was done in 8/22 to assess the bioprosthetic aortic valve.  This showed EF 60-65%, severe LVH, normal RV, bioprosthetic AoV that opened with minimal restriction, mean gradient 20 mmHg in setting of anesthesia and BP control, no AI.  Suspect small/undersized aortic valve.   Patient now has ESRD and is on dialysis.    He presents for followup of CAD, HTN.  He still smokes but down to a  few cigarettes/day. He reports steadily worsening chest pain over the last few months.  He gets central chest aching with exertion only, it resolves with rest.  It is happening daily, he will get chest pain after walking about 50 yards and will have to stop.  No chest pain at rest.  He does not take NTG.  No significant dyspnea.  No orthopnea/PND.  No lightheadedness/syncope. BP is elevated today.    ECG (personally reviewed): NSR, LVH with repolarization abnormality.    Labs (10/19): K 4.1, creatinine 1.78, myeloma panel negative Labs (2/20): K 4.2, creatinine 1.75, LDL 128 Labs (7/21): LDL 66 Labs (9/21): K 4.1, creatinine 3.31, hgb 11.9 Labs (10/21): K 4, creatinine 2.96, hgb 12.8 Labs (3/22): LDL 55   Review of Systems: All systems reviewed and negative except as per HPI.    PMH: 1. H/o ankle fractures bilaterally 2. GERD 3. HTN: Negative urinary catecholeamine collection for pheochromocytoma.  - Renal artery dopplers (3/22): Not suggestive of significant renal artery stenosis.  4. ESRD: Suspect hypertensive nephropathy.  5. Cardiomyopathy: Most likely due to heavy ETOH ingestion and uncontrolled HTN.   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  Lexiscan myoview (4/13): EF 37%, global hypokinesis, no ischemic or infarction.  Echo (12/14) with EF 45-50%, diffuse hypokinesis, moderate LVH, mild MR, moderate AI.  Echo (4/16) with EF 55-60%, severe LVH, mild  LV dilation, moderate AI and moderate MR.   - Echo (9/19): EF 35-40% with diffuse hypokinesis, severe LVH, bioprosthetic aortic valve functioning normally.  - PYP scan (1/20): No evidence for TTR amyloidosis.  - Echo (9/21): EF 60-65%, severe LVH, normal RV, bioprosthetic aortic valve with mean gradient 18 mmHg.  - TEE (8/22): EF 60-65%, severe LVH, normal RV, bioprosthetic AoV that opened with minimal restriction,  mean gradient 20 mmHg in setting of anesthesia and BP control, no AI.  Suspect small/undersized aortic valve. 6. ETOH abuse: Quit 3/13.  7. Active smoker. 8. Valvular heart disease: Echo in 3/13 showed moderate to severe eccentric MR and mild to moderate AI with prolapsing left coronary cusp. Echo 12/14 with mild MR and moderate AI.  Echo 4/16 with moderate AI and moderate MR.  - S/p Bentall procedure with bioprosthetic aortic valve 9/16.  - Echoes in 3/22 and 6/22 showed hyperdynamic LV with bioprosthetic aortic valve with mean gradient > 30.  - TEE (8/22): EF 60-65%, severe LVH, normal RV, bioprosthetic AoV that opened with minimal restriction, mean gradient 20 mmHg in setting of anesthesia and BP control, no AI.  Suspect small/undersized aortic valve. 9.  CVA: Has ILR. 10. Type B aortic dissection: Occurred in 2/16, from just distal to the left subclavian.  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    - 10/03/2014 First stage median sternotomy with right axillary cannulation for aortic valved conduit root replacement with coronary reconstruction (button Bentall Procedure), 27 mm Sorin Mitroflow bovine pericardial valved conduit, total arch replacement (89m Vascutek BSomaliamultibranch arch graft with individual head vessel reimplantation. - Status post  open repair of an Extent III TAA secondary to chronic dissection with a # 273mVascutek "Coselli" multibranch Dacron graft with individual celiac, SMA, and bilateral renal artery implantation on 01/04/2017, [facility].  11. CAD: LHC (4/16) with 60% ostial D1.   - NSTEMI 9/19: LHC (9/19) showed 50% proximal LCx, 90% ramus => DES to ramus.  - LHC (7/20): D1 60% stenosis, patent ramus stent.  - Cardiolite (9/21): Inferior fixed defect, no ischemia.  12. Sleep study (10/21) with no significant OSA.   Current Outpatient Medications  Medication Sig Dispense Refill   acetaminophen (TYLENOL) 325 MG tablet Take 650 mg by mouth every 6  (six) hours as needed for moderate pain (for pain or headaches).     albuterol (VENTOLIN HFA) 108 (90 Base) MCG/ACT inhaler Inhale 2 puffs into the lungs every 6 (six) hours as needed for wheezing or shortness of breath.     amLODipine (NORVASC) 5 MG tablet Take 1 tablet (5 mg total) by mouth daily. 30 tablet 6   aspirin EC 81 MG EC tablet Take 1 tablet (81 mg total) by mouth daily. 90 tablet 3   atorvastatin (LIPITOR) 80 MG tablet Take 1 tablet (80 mg total) by mouth daily. 30 tablet 2   Blood Pressure Monitor KIT 1 Device by Does not apply route 2 (two) times daily. 1 kit 0   carvedilol (COREG) 25 MG tablet Take 25 mg by mouth 2 (two) times daily with a meal.     hydrALAZINE (APRESOLINE) 50 MG tablet Take 1.5 tablets (75 mg total) by mouth 3 (three) times daily. 135 tablet 1   HYDROcodone-acetaminophen (NORCO/VICODIN) 5-325 MG tablet Take 1 tablet by mouth every 4 (four) hours as needed. 10 tablet 0   pantoprazole (PROTONIX) 40 MG tablet Take 1 tablet (40 mg total) by mouth daily. 30 tablet 6   STIOLTO  RESPIMAT 2.5-2.5 MCG/ACT AERS INHALE 2 PUFFS INTO THE LUNGS DAILY 4 g 5   Evolocumab (REPATHA SURECLICK) 681 MG/ML SOAJ Inject 1 Dose into the skin every 14 (fourteen) days. (Patient not taking: No sig reported) 2 mL 11   No current facility-administered medications for this encounter.    Allergies:   Imdur [isosorbide nitrate] and Tramadol   Social History:  The patient  reports that he has been smoking cigarettes. He has a 5.75 pack-year smoking history. He has never used smokeless tobacco. He reports that he does not currently use alcohol after a past usage of about 1.0 standard drink per week. He reports current drug use. Drug: Marijuana.   Family History:  The patient's family history includes Colon cancer in his paternal uncle; Diabetes in his maternal aunt; Headache in his sister; Heart attack in his brother; Hypertension in his brother, mother, and another family member; Lung cancer in  his maternal uncle; Other in his sister; Stroke in his maternal aunt and maternal uncle; Thyroid disease in his sister.   ROS:  Please see the history of present illness.   All other systems are personally reviewed and negative.   Exam:   BP (!) 164/90   Pulse 78   Wt 70.5 kg (155 lb 6.4 oz)   SpO2 100%   BMI 21.67 kg/m  General: NAD Neck: No JVD, no thyromegaly or thyroid nodule.  Lungs: Clear to auscultation bilaterally with normal respiratory effort. CV: Nondisplaced PMI.  Heart regular S1/S2, no S3/S4, 2/6 early SEM RUSB.  No peripheral edema.  No carotid bruit.  Normal pedal pulses.  Abdomen: Soft, nontender, no hepatosplenomegaly, no distention.  Skin: Intact without lesions or rashes.  Neurologic: Alert and oriented x 3.  Psych: Normal affect. Extremities: No clubbing or cyanosis.  HEENT: Normal.    Recent Labs: 07/23/2020: B Natriuretic Peptide 334.1 09/05/2020: ALT 14; TSH 3.472 01/29/2021: BUN 22; Creatinine, Ser 6.16; Hemoglobin 10.6; Platelets 148; Potassium 3.8; Sodium 136  Personally reviewed   Wt Readings from Last 3 Encounters:  01/29/21 70.5 kg (155 lb 6.4 oz)  01/04/21 70.8 kg (156 lb)  11/25/20 70.1 kg (154 lb 9.6 oz)      ASSESSMENT AND PLAN:  1. CAD: 9/19 NSTEMI with PCI to proximal ramus.  LHC in 7/20 with no obstructive disease.  Admission in 9/21 with Cardiolite showing no ischemia.  He has had long-standing atypical chest pain, but over the last few months, he has developed progressive exertional chest pain resolving with rest that is reproducible after walking about 50 yards.  This is quite concerning for coronary ischemia.  - Continue ASA 81.  - He has been on atorvastatin and Repatha, but has been out of Repatha.  I will contact lipid clinic to get him started back on it.  - With typical angina that has been progressive, I think that he needs coronary angiography.  I discussed risks/benefits and he agrees to procedure.   2. 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 Myeloma panel negative and PYP scan negative, suspect due more likely to HTN than hypertrophic cardiomyopathy.  He is not volume overloaded on exam and is not using Lasix.  Cannot get cardiac MRI with elevated creatinine.  Most recent study was a TEE in 8/22 showing EF 60-65% with severe LVH.  The LVH is likely due to long-standing HTN.  NYHA class II symptoms.  - Continue Coreg 25 mg  bid.  3. HTN: Still needs better control.  Renal artery dopplers in 3/22 were not suggestive of significant renal artery stenosis.  Sleep study in 10/21 was negative.  - Continue Coreg 25 mg bid.  - Continue hydralazine 75 mg tid.  - Start amlodipine 5 mg daily.  4. S/P Bioprothetic AVR: Echoes in 3/22 and 6/22 showed hyperdynamic LV with mean aortic valve gradient > 30.  TEE was done to investigate.  This showed bioprosthetic AoV that opened with minimal restriction, mean gradient 20 mmHg in setting of anesthesia and BP control, no AI.  Suspect small/undersized aortic valve.  5. S/P 2016 and 2018 thoracic aortic aneurysm repair: Followed by Dr. [name] at [facility].  - He has not been seen at [facility] for several years, needs followup of aorta => will try to arrange appt for him.  6. ESRD: Think this was due to controlled HTN.  7. Active smoker: Still smoking.  Has to quit before he can get a renal transplant.   Followup in 1 month with APP.    Signed, [name], MD  01/29/2021  [facility] [address] [facility] [place] [address] [phone] (office) [phone] (fax)

## 2021-01-31 ENCOUNTER — Telehealth (HOSPITAL_COMMUNITY): Payer: Self-pay | Admitting: *Deleted

## 2021-01-31 NOTE — Telephone Encounter (Signed)
Pt left vm requesting return call. I called pt back no answer left vm for return call.  

## 2021-02-03 ENCOUNTER — Ambulatory Visit (INDEPENDENT_AMBULATORY_CARE_PROVIDER_SITE_OTHER): Payer: Medicare Other | Admitting: Physician Assistant

## 2021-02-03 ENCOUNTER — Other Ambulatory Visit: Payer: Self-pay

## 2021-02-03 VITALS — BP 185/92 | HR 75 | Temp 97.9°F | Resp 20 | Ht 71.0 in | Wt 162.5 lb

## 2021-02-03 DIAGNOSIS — N186 End stage renal disease: Secondary | ICD-10-CM

## 2021-02-03 DIAGNOSIS — Z992 Dependence on renal dialysis: Secondary | ICD-10-CM

## 2021-02-03 NOTE — Progress Notes (Signed)
Office Note     CC:  follow up Requesting Provider:  Edrick Oh, MD  HPI: [name] is a 50 y.o. (February 05, 1971) male who presents for evaluation of left brachiobasilic transposed fistula.  Patient experienced an infiltration event about 4 to 6 weeks ago.  He continues to dialyze via right IJ [facility] on a Tuesday Thursday Saturday schedule at the [place] location.  He states that the hematoma is still present and is very slowly improving.  He denies any drainage or redness in this area.  He also denies any fevers, chills, nausea/vomiting.  He does not have any steal symptoms.   Past Medical History:  Diagnosis Date   Adenomatous colon polyp 08/2015   Anxiety    Aortic disease ([place])    Aortic dissection (HCC)    a. admx 04/2014 >> L renal infarct; a/c renal failure >> b.  s/p Bioprosthetic [place] and total arch replacement and staged endovascular repair of descending aortic aneurysm ([facility] - Dr. [name])   CAD (coronary artery disease)    a. LHC 4/16:  oD1 60%   Cardiomyopathy ([place])    a. non-ischemic - probably related to untreated HTN and ETOH abuse - Echo 3/13 with EF 35-40% >> b. Echo 4/16: Severe LVH, EF 55-60%, moderate AI, moderate MR, mild LAE, trivial effusion, known type B dissection with communication between true and false lumens with suprasternal images suggesting dissection plane may propagate to at least left subclavian takeoff, root above aortic valve okay     Chronic abdominal pain    Chronic combined systolic and diastolic congestive heart failure ([place])    a. 05/2011: Adm with pulm edema/HTN urgency, EF 35-40% with diffuse hypokinesis and moderate to severe mitral regurgitation. Cardiomyopathy likely due to uncontrolled HTN and ETOH abuse - cath deferred due to renal insufficiency (felt due to uncontrolled HTN). bJodie Echevaria MV 06/2011: EF 37% and no ischemia or infarction. c. EF 45-50% by echo 01/2012.   Chronic sinusitis    CKD (chronic kidney disease)    dialysis  m,t,th,fri   DDD (degenerative disc disease), lumbar    Depression    Descending thoracic aortic aneurysm    Dissecting aneurysm of thoracic aorta    ETOH abuse    a. Reported to have quit 05/2011.   Frequent headaches    GERD (gastroesophageal reflux disease)    Headache(784.0)    "q other day" (08/08/2013)   Heart murmur    Hemorrhoid thrombosis    History of echocardiogram    Echo 1/17:  Severe LVH, EF 55-60%, no RWMA, Gr 2 DD, AVR ok, mild to mod MR, mild LAE, mild reduced RVSF, mod RAE   History of medication noncompliance    HYPERLIPIDEMIA    Hypertension    a. Hx of HTN urgency secondary to noncompliance. b. urinary metanephrine and catecholeamine levels normal 2013.  c. Renal art [name] 1/16:  No evidence of renal artery stenosis noted bilaterally.   INGUINAL HERNIA    Pneumonia ~ 2013   Renal insufficiency    Serrated adenoma of colon 08/2015   Stroke [facility])    Tobacco abuse    Valvular heart disease    a. Echo 05/2011: moderate to severe eccentric MR and mild to moderate AI with prolapsing left coronary cusp. b. Echo 01/2012: mild-mod AI, mild dilitation of aortic root, mild MR.;  c. Echo 1/16: Severe LVH consistent with hypertrophic cardio myopathy, EF 50%, no RWMA, mod AI, mild MR, mild RAE, dilated Ao root (40  mm);     Past Surgical History:  Procedure Laterality Date   ANKLE SURGERY Bilateral    Fractures bilaterally   AORTIC VALVE SURGERY  09/2014   AV FISTULA PLACEMENT Left 09/09/2020   Procedure: LEFT ARM ARTERIOVENOUS (AV) FISTULA CREATION;  Surgeon: Cherre Robins, MD;  Location: [place];  Service: Vascular;  Laterality: Left;   BASCILIC VEIN TRANSPOSITION Left 10/09/2020   Procedure: LEFT SECOND STAGE BASILIC VEIN TRANSPOSITION;  Surgeon: Cherre Robins, MD;  Location: [place];  Service: Vascular;  Laterality: Left;  PERIPHERAL NERVE BLOCK   CORONARY ANGIOGRAPHY N/A 12/06/2017   Procedure: CORONARY ANGIOGRAPHY;  Surgeon: Belva Crome, MD;  Location: [place] CV  LAB;  Service: Cardiovascular;  Laterality: N/A;   CORONARY ANGIOGRAPHY N/A 09/26/2018   Procedure: CORONARY ANGIOGRAPHY;  Surgeon: Nelva Bush, MD;  Location: [place] CV LAB;  Service: Cardiovascular;  Laterality: N/A;   CORONARY STENT INTERVENTION N/A 12/10/2017   Procedure: CORONARY STENT INTERVENTION;  Surgeon: [name] Sine, MD;  Location: [place] CV LAB;  Service: Cardiovascular;  Laterality: N/A;   FOOT FRACTURE SURGERY Bilateral 2004-2010   "got pins in both of them"   HEMORRHOID SURGERY N/A 06/15/2015   Procedure: HEMORRHOIDECTOMY;  Surgeon: Stark Klein, MD;  Location: [place];  Service: General;  Laterality: N/A;   INGUINAL HERNIA REPAIR Right ~ [place] N/A 09/09/2020   Procedure: INSERTION OF TUNNELED DIALYSIS PALINDROME 19cm CATHETER;  Surgeon: Cherre Robins, MD;  Location: [place];  Service: Vascular;  Laterality: N/A;   [place] N/A 06/21/2014   Procedure: LEFT HEART CATHETERIZATION WITH CORONARY ANGIOGRAM;  Surgeon: Larey Dresser, MD;  Location: [facility] CATH LAB;  Service: Cardiovascular;  Laterality: N/A;   LOOP RECORDER INSERTION N/A 06/19/2016   Procedure: Loop Recorder Insertion;  Surgeon: Deboraha Sprang, MD;  Location: [place] CV LAB;  Service: Cardiovascular;  Laterality: N/A;   TEE WITHOUT CARDIOVERSION N/A 03/12/2016   Procedure: TRANSESOPHAGEAL ECHOCARDIOGRAM (TEE);  Surgeon: Sanda Klein, MD;  Location: [facility] ENDOSCOPY;  Service: Cardiovascular;  Laterality: N/A;   TEE WITHOUT CARDIOVERSION N/A 10/16/2020   Procedure: TRANSESOPHAGEAL ECHOCARDIOGRAM (TEE);  Surgeon: Larey Dresser, MD;  Location: [facility]OSCOPY;  Service: Cardiovascular;  Laterality: N/A;    Social History   Socioeconomic History   Marital status: Married    Spouse name: Not on file   Number of children: 5   Years of education: 12   Highest education level: Not on file  Occupational History   Occupation: Disabled,  part-time Programmer, systems    Comment: Disability  Tobacco Use   Smoking status: Some Days    Packs/day: 0.25    Years: 23.00    Pack years: 5.75    Types: Cigarettes   Smokeless tobacco: Never   Tobacco comments:    3 to 4 per day  Vaping Use   Vaping Use: Not on file  Substance and Sexual Activity   Alcohol use: Not Currently    Alcohol/week: 1.0 standard drink    Types: 1 Cans of beer per week    Comment: On special occasion   Drug use: Yes    Types: Marijuana    Comment: 2 times/week - last use 10/06/20   Sexual activity: Yes  Other Topics Concern   Not on file  Social History Narrative   Fun: Enjoy his children   Social Determinants of Health   Financial Resource Strain: Not on file  Food Insecurity: Not on file  Transportation Needs: Not on file  Physical Activity: Not on file  Stress: Not on file  Social Connections: Not on file  Intimate Partner Violence: Not on file    Family History  Problem Relation Age of Onset   Hypertension Mother    Hypertension Other    Colon cancer Paternal Uncle    Stroke Maternal Aunt    Heart attack Brother    Hypertension Brother    Diabetes Maternal Aunt    Lung cancer Maternal Uncle    Stroke Maternal Uncle    Other Sister        "breathing machine at night"   Headache Sister    Thyroid disease Sister    Throat cancer Neg Hx    Pancreatic cancer Neg Hx    Esophageal cancer Neg Hx    Kidney disease Neg Hx    Liver disease Neg Hx     Current Outpatient Medications  Medication Sig Dispense Refill   acetaminophen (TYLENOL) 325 MG tablet Take 650 mg by mouth every 6 (six) hours as needed for moderate pain (for pain or headaches).     albuterol (VENTOLIN HFA) 108 (90 Base) MCG/ACT inhaler Inhale 2 puffs into the lungs every 6 (six) hours as needed for wheezing or shortness of breath.     amLODipine (NORVASC) 5 MG tablet Take 1 tablet (5 mg total) by mouth daily. 30 tablet 6   aspirin EC 81 MG EC tablet Take 1 tablet (81  mg total) by mouth daily. 90 tablet 3   atorvastatin (LIPITOR) 80 MG tablet Take 1 tablet (80 mg total) by mouth daily. 30 tablet 2   b complex-vitamin c-folic acid (NEPHRO-VITE) 0.8 MG TABS tablet Take 1 tablet by mouth daily.     Blood Pressure Monitor KIT 1 Device by Does not apply route 2 (two) times daily. 1 kit 0   carvedilol (COREG) 25 MG tablet Take 25 mg by mouth 2 (two) times daily with a meal.     Evolocumab (REPATHA SURECLICK) 237 MG/ML SOAJ Inject 1 Dose into the skin every 14 (fourteen) days. 2 mL 11   heparin 1000 unit/mL SOLN injection Heparin Sodium (Porcine) 1,000 Units/mL Systemic     hydrALAZINE (APRESOLINE) 50 MG tablet Take 1.5 tablets (75 mg total) by mouth 3 (three) times daily. 135 tablet 1   HYDROcodone-acetaminophen (NORCO/VICODIN) 5-325 MG tablet Take 1 tablet by mouth every 4 (four) hours as needed. 10 tablet 0   Methoxy PEG-Epoetin Beta (MIRCERA IJ) Mircera     pantoprazole (PROTONIX) 40 MG tablet Take 1 tablet (40 mg total) by mouth daily. 30 tablet 6   STIOLTO RESPIMAT 2.5-2.5 MCG/ACT AERS INHALE 2 PUFFS INTO THE LUNGS DAILY 4 g 5   VITAMIN D PO Take by mouth.     No current facility-administered medications for this visit.    Allergies  Allergen Reactions   Imdur [Isosorbide Nitrate] Other (See Comments)    Headaches -- patient instructed to continue taking    Tramadol Other (See Comments)    Headaches      REVIEW OF SYSTEMS:   '[X]'  denotes positive finding, '[ ]'  denotes negative finding Cardiac  Comments:  Chest pain or chest pressure:    Shortness of breath upon exertion:    Short of breath when lying flat:    Irregular heart rhythm:        Vascular    Pain in calf, thigh, or hip brought on by ambulation:  Pain in feet at night that wakes you up from your sleep:     Blood clot in your veins:    Leg swelling:         Pulmonary    Oxygen at home:    Productive cough:     Wheezing:         Neurologic    Sudden weakness in arms or legs:      Sudden numbness in arms or legs:     Sudden onset of difficulty speaking or slurred speech:    Temporary loss of vision in one eye:     Problems with dizziness:         Gastrointestinal    Blood in stool:     Vomited blood:         Genitourinary    Burning when urinating:     Blood in urine:        Psychiatric    Major depression:         Hematologic    Bleeding problems:    Problems with blood clotting too easily:        Skin    Rashes or ulcers:        Constitutional    Fever or chills:      PHYSICAL EXAMINATION:  Vitals:   02/03/21 0932  BP: (!) 185/92  Pulse: 75  Resp: 20  Temp: 97.9 F (36.6 C)  TempSrc: Temporal  SpO2: 99%  Weight: 162 lb 8 oz (73.7 kg)  Height: '5\' 11"'  (1.803 m)    General:  WDWN in NAD; vital signs documented above Gait: Not observed HENT: WNL, normocephalic Pulmonary: normal non-labored breathing Cardiac: regular HR Abdomen: soft, NT, no masses Skin: without rashes Vascular Exam/Pulses:  Right Left  Radial 2+ (normal) 2+ (normal)   Extremities: without ischemic changes, without Gangrene , without cellulitis; without open wounds; palpable thrill throughout left arm brachiobasilic fistula Musculoskeletal: no muscle wasting or atrophy  Neurologic: A&O X 3;  No focal weakness or paresthesias are detected Psychiatric:  The pt has Normal affect    ASSESSMENT/PLAN:: 50 y.o. male here for follow up for evaluation of left arm fistula after infiltration of the  -Patent left arm fistula with palpable thrill throughout -Hematoma remains present in the "stick zone;" patient would prefer for this to completely resolve prior to accessing fistula and removing TDC -Reassured the patient that hematoma will continue to resolve however this will take time.  He can periodically use warm compress to help speed reabsorption -This can likely be used in another 2 to 4 weeks however will defer to patient as he has strong feelings for waiting until  hematoma completely resolves.  Patient can follow-up on an as-needed basis.  He will continue HD via [facility] for now   Dagoberto Ligas, PA-C [facility] [phone]  Clinic MD:   Virl Cagey

## 2021-02-04 ENCOUNTER — Telehealth (HOSPITAL_COMMUNITY): Payer: Self-pay | Admitting: *Deleted

## 2021-02-04 ENCOUNTER — Telehealth (HOSPITAL_COMMUNITY): Payer: Self-pay

## 2021-02-04 NOTE — Telephone Encounter (Signed)
Patient has left heart cath scheduled and needs  precert.

## 2021-02-04 NOTE — Telephone Encounter (Signed)
Auth obtained and added to referral entry

## 2021-02-05 ENCOUNTER — Ambulatory Visit (HOSPITAL_COMMUNITY)
Admission: RE | Admit: 2021-02-05 | Discharge: 2021-02-05 | Disposition: A | Discharge status: Home / Self Care | Payer: Medicare Other | Attending: Cardiology | Admitting: Cardiology

## 2021-02-05 ENCOUNTER — Encounter (HOSPITAL_COMMUNITY)
Admission: RE | Disposition: A | Discharge status: Home / Self Care | Payer: Self-pay | Source: Home / Self Care | Attending: Cardiology

## 2021-02-05 ENCOUNTER — Other Ambulatory Visit: Payer: Self-pay

## 2021-02-05 DIAGNOSIS — Z953 Presence of xenogenic heart valve: Secondary | ICD-10-CM | POA: Diagnosis not present

## 2021-02-05 DIAGNOSIS — I132 Hypertensive heart and chronic kidney disease with heart failure and with stage 5 chronic kidney disease, or end stage renal disease: Secondary | ICD-10-CM | POA: Insufficient documentation

## 2021-02-05 DIAGNOSIS — I5042 Chronic combined systolic (congestive) and diastolic (congestive) heart failure: Secondary | ICD-10-CM | POA: Insufficient documentation

## 2021-02-05 DIAGNOSIS — I25118 Atherosclerotic heart disease of native coronary artery with other forms of angina pectoris: Secondary | ICD-10-CM | POA: Insufficient documentation

## 2021-02-05 DIAGNOSIS — R079 Chest pain, unspecified: Secondary | ICD-10-CM

## 2021-02-05 DIAGNOSIS — F17210 Nicotine dependence, cigarettes, uncomplicated: Secondary | ICD-10-CM | POA: Insufficient documentation

## 2021-02-05 DIAGNOSIS — Z79899 Other long term (current) drug therapy: Secondary | ICD-10-CM | POA: Diagnosis not present

## 2021-02-05 DIAGNOSIS — Z7982 Long term (current) use of aspirin: Secondary | ICD-10-CM | POA: Insufficient documentation

## 2021-02-05 DIAGNOSIS — I252 Old myocardial infarction: Secondary | ICD-10-CM | POA: Diagnosis not present

## 2021-02-05 DIAGNOSIS — I2510 Atherosclerotic heart disease of native coronary artery without angina pectoris: Secondary | ICD-10-CM | POA: Diagnosis not present

## 2021-02-05 DIAGNOSIS — N186 End stage renal disease: Secondary | ICD-10-CM | POA: Diagnosis not present

## 2021-02-05 HISTORY — PX: LEFT HEART CATH AND CORONARY ANGIOGRAPHY: CATH118249

## 2021-02-05 SURGERY — LEFT HEART CATH AND CORONARY ANGIOGRAPHY
Anesthesia: LOCAL

## 2021-02-05 MED ORDER — SODIUM CHLORIDE 0.9 % IV SOLN: 250.0000 mL | INTRAVENOUS | Status: DC | PRN

## 2021-02-05 MED ORDER — SODIUM CHLORIDE 0.9% FLUSH: 3.0000 mL | Freq: Two times a day (BID) | INTRAVENOUS | Status: DC

## 2021-02-05 MED ORDER — SODIUM CHLORIDE 0.9 % IV SOLN: INTRAVENOUS | Status: DC

## 2021-02-05 MED ORDER — FENTANYL CITRATE (PF) 100 MCG/2ML IJ SOLN
INTRAMUSCULAR | Status: DC | PRN
  Administered 2021-02-05: 25 ug via INTRAVENOUS

## 2021-02-05 MED ORDER — HYDRALAZINE HCL 20 MG/ML IJ SOLN: 10.0000 mg | INTRAMUSCULAR | Status: DC | PRN

## 2021-02-05 MED ORDER — HEPARIN SODIUM (PORCINE) 1000 UNIT/ML IJ SOLN
INTRAMUSCULAR | Status: AC
  Filled 2021-02-05: qty 10

## 2021-02-05 MED ORDER — VERAPAMIL HCL 2.5 MG/ML IV SOLN
INTRAVENOUS | Status: AC
  Filled 2021-02-05: qty 2

## 2021-02-05 MED ORDER — LIDOCAINE HCL (PF) 1 % IJ SOLN
INTRAMUSCULAR | Status: DC | PRN
  Administered 2021-02-05: 10 mL

## 2021-02-05 MED ORDER — SODIUM CHLORIDE 0.9% FLUSH: 3.0000 mL | INTRAVENOUS | Status: DC | PRN

## 2021-02-05 MED ORDER — LABETALOL HCL 5 MG/ML IV SOLN: 10.0000 mg | INTRAVENOUS | Status: DC | PRN

## 2021-02-05 MED ORDER — LIDOCAINE HCL (PF) 1 % IJ SOLN
INTRAMUSCULAR | Status: AC
  Filled 2021-02-05: qty 30

## 2021-02-05 MED ORDER — IOHEXOL 350 MG/ML SOLN
INTRAVENOUS | Status: DC | PRN
  Administered 2021-02-05: 80 mL

## 2021-02-05 MED ORDER — HEPARIN (PORCINE) IN NACL 1000-0.9 UT/500ML-% IV SOLN
INTRAVENOUS | Status: DC | PRN
  Administered 2021-02-05 (×2): 500 mL

## 2021-02-05 MED ORDER — ONDANSETRON HCL 4 MG/2ML IJ SOLN: 4.0000 mg | Freq: Four times a day (QID) | INTRAMUSCULAR | Status: DC | PRN

## 2021-02-05 MED ORDER — FENTANYL CITRATE (PF) 100 MCG/2ML IJ SOLN
INTRAMUSCULAR | Status: AC
  Filled 2021-02-05: qty 2

## 2021-02-05 MED ORDER — MIDAZOLAM HCL 2 MG/2ML IJ SOLN
INTRAMUSCULAR | Status: AC
  Filled 2021-02-05: qty 2

## 2021-02-05 MED ORDER — HEPARIN (PORCINE) IN NACL 1000-0.9 UT/500ML-% IV SOLN
INTRAVENOUS | Status: AC
  Filled 2021-02-05: qty 500

## 2021-02-05 MED ORDER — ASPIRIN 81 MG PO CHEW
CHEWABLE_TABLET | ORAL | Status: AC
  Administered 2021-02-05: 81 mg
  Filled 2021-02-05: qty 1

## 2021-02-05 MED ORDER — MIDAZOLAM HCL 2 MG/2ML IJ SOLN
INTRAMUSCULAR | Status: DC | PRN
  Administered 2021-02-05: 1 mg via INTRAVENOUS

## 2021-02-05 MED ORDER — ACETAMINOPHEN 325 MG PO TABS: 650.0000 mg | ORAL_TABLET | ORAL | Status: DC | PRN

## 2021-02-05 SURGICAL SUPPLY — 11 items
CATH INFINITI 5 FR JL3.5 (CATHETERS) ×1 IMPLANT
CATH INFINITI 5FR MULTPACK ANG (CATHETERS) ×1 IMPLANT
CLOSURE MYNX CONTROL 5F (Vascular Products) ×1 IMPLANT
GUIDEWIRE ANGLED .035X150CM (WIRE) ×1 IMPLANT
KIT HEART LEFT (KITS) ×2 IMPLANT
PACK CARDIAC CATHETERIZATION (CUSTOM PROCEDURE TRAY) ×2 IMPLANT
SHEATH PINNACLE 5F 10CM (SHEATH) ×1 IMPLANT
TRANSDUCER W/STOPCOCK (MISCELLANEOUS) ×2 IMPLANT
TUBING CIL FLEX 10 FLL-RA (TUBING) ×2 IMPLANT
WIRE EMERALD 3MM-J .035X150CM (WIRE) ×1 IMPLANT
WIRE EMERALD 3MM-J .035X260CM (WIRE) ×1 IMPLANT

## 2021-02-05 NOTE — Interval H&P Note (Signed)
History and Physical Interval Note:  02/05/2021 1:13 PM  [name]  has presented today for surgery, with the diagnosis of chest pain.  The various methods of treatment have been discussed with the patient and family. After consideration of risks, benefits and other options for treatment, the patient has consented to  Procedure(s): LEFT HEART CATH AND CORONARY ANGIOGRAPHY (N/A) as a surgical intervention.  The patient's history has been reviewed, patient examined, no change in status, stable for surgery.  I have reviewed the patient's chart and labs.  Questions were answered to the patient's satisfaction.     Oluwatimileyin Vivier [employer]

## 2021-02-07 ENCOUNTER — Encounter (HOSPITAL_COMMUNITY): Payer: Self-pay | Admitting: Cardiology

## 2021-02-24 ENCOUNTER — Telehealth: Payer: Self-pay | Admitting: Internal Medicine

## 2021-02-24 DIAGNOSIS — E785 Hyperlipidemia, unspecified: Secondary | ICD-10-CM

## 2021-02-24 DIAGNOSIS — I2510 Atherosclerotic heart disease of native coronary artery without angina pectoris: Secondary | ICD-10-CM

## 2021-02-24 DIAGNOSIS — I25119 Atherosclerotic heart disease of native coronary artery with unspecified angina pectoris: Secondary | ICD-10-CM

## 2021-02-24 NOTE — Telephone Encounter (Signed)
PA for repatha submitted via CMM (Key: [id number]) Attached letter of medical necessity, as prior approval was w/different insurance plan

## 2021-02-26 NOTE — Telephone Encounter (Addendum)
Repatha PA denied -- reasoning that LDL was NOT less than 70mg /dL with ASCVD  Called [employer]/insurance reps and spent 20 minutes trying to reach someone to do a peer-peer to explain situation that patient has been on therapy, with different insurance, thus his LDL is at target b/c he is currently taking med. This was also explained in letter of medical necessity that was submitted with PA request in [facility]  No rep was able to assist Will manually fax appeal to [phone]

## 2021-02-27 ENCOUNTER — Telehealth: Payer: Self-pay

## 2021-02-27 ENCOUNTER — Telehealth (HOSPITAL_COMMUNITY): Payer: Self-pay

## 2021-02-27 NOTE — Telephone Encounter (Signed)
Received referral from Dr. Edrick Oh - pulling clots unable to cannulate - To scheduling.

## 2021-02-27 NOTE — Telephone Encounter (Signed)
Called to confirm/remind patient of their appointment at the [facility] on 02/28/21.   Patient reminded to bring all medications and/or complete list.  Confirmed patient has transportation. Gave directions, instructed to utilize [facility] parking.  Confirmed appointment prior to ending call.

## 2021-02-27 NOTE — Progress Notes (Signed)
Date:  02/28/2021   ID:  [name], DOB 12/10/1970, MRN [id number]   Provider location: [place] [facility] Type of Visit: Established patient   PCP:  Arthur Holms, NP  Cardiologist:  Dr. Aundra Dubin   History of Present Illness: [name] is a 50 y.o. male who has a history of CAD, NSTEMI 11/2017,  smoking, CKD Stage III, HTN, AAA repair 2016 at [facility] , open repair of TAAA secondary to chronic dissection with multibranch dacron graft 12/2016, CVA 2017, loop recorder, bioprothetic AVR 2016, HLD.    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  The descending thoracic aorta has dilated to 5.1 cm.  Cardiac catheterization in April 2016 demonstrated 60% first diagonal lesion.  He underwent aortic valve replacement with bovine pericardial tissue valve and reconstruction along with aortic root replacement and endovascular repair of the descending thoracic aortic aneurysm at [facility].  He has had intermittent chest discomfort since then.   Admitted in 12/02/2017 chest pain. Had NSTEMI. He had LHC with stent placed proximal ramus.  Repeat cath in 7/20 with nonobstructive disease.   He was admitted in 9/21 with atypical chest pain.  Echo showed EF 60-65% with severe LVH, normal RV, bioprosthetic aortic valve with mean gradient 18 mmHg.  Cardiolite showed no ischemia.   Echoes in 3/22 and 6/22 showed hyperdynamic LV with aortic valve mean gradient > 30. Therefore, TEE was done in 8/22 to assess the bioprosthetic aortic valve.  This showed EF 60-65%, severe LVH, normal RV, bioprosthetic AoV that opened with minimal restriction, mean gradient 20 mmHg in setting of anesthesia and BP control, no AI.  Suspect small/undersized aortic valve.   Patient now has ESRD and is on dialysis.   [place] 11/22 showed small high 1st diagonal with diffuse severe  disease, similar to prior. Otherwise, nonobstructive disease.  Today he returns for HF follow up of CAD and HTN. Having trouble with accessing his fistula at dialysis and now using port. No significant exertional dyspnea with walking on flat ground. Continues to have atypical chest pressure/ache. Rest makes this better. Some positional dizziness but no falls. Denies palpitations, abnormal bleeding, edema, or PND/Orthopnea. Appetite ok. No fever or chills. Weight at home 162 pounds. Taking all medications. Now drinks ETOH only socially, down from drinking daily. He is worried his chest discomfort is cancer. Smoking 4 cigarettes/day. Has been off his Stiolto.   ECG (personally reviewed): NSR, LVH     Labs (10/19): K 4.1, creatinine 1.78, myeloma panel negative Labs (2/20): K 4.2, creatinine 1.75, LDL 128 Labs (7/21): LDL 66 Labs (9/21): K 4.1, creatinine 3.31, hgb 11.9 Labs (10/21): K 4, creatinine 2.96, hgb 12.8 Labs (3/22): LDL 55 Labs (10/22): hgb 10.6 Labs (11/22): K 3.8, hgb 10.6   Review of Systems: All systems reviewed and negative except as per HPI.    PMH: 1. H/o ankle fractures bilaterally 2. GERD 3. HTN: Negative urinary catecholeamine collection for pheochromocytoma.  - Renal artery dopplers (3/22): Not suggestive of significant renal artery stenosis.  4. ESRD: Suspect hypertensive nephropathy.  5. Cardiomyopathy: Most likely due to heavy ETOH ingestion and uncontrolled HTN.   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  Lexiscan myoview (  4/13): EF 37%, global hypokinesis, no ischemic or infarction.  Echo (12/14) with EF 45-50%, diffuse hypokinesis, moderate LVH, mild MR, moderate AI.  Echo (4/16) with EF 55-60%, severe LVH, mild LV dilation, moderate AI and moderate MR.   - Echo (9/19): EF 35-40% with diffuse hypokinesis, severe LVH, bioprosthetic aortic valve  functioning normally.  - PYP scan (1/20): No evidence for TTR amyloidosis.  - Echo (9/21): EF 60-65%, severe LVH, normal RV, bioprosthetic aortic valve with mean gradient 18 mmHg.  - TEE (8/22): EF 60-65%, severe LVH, normal RV, bioprosthetic AoV that opened with minimal restriction, mean gradient 20 mmHg in setting of anesthesia and BP control, no AI.  Suspect small/undersized aortic valve. 6. ETOH abuse: Quit 3/13.  7. Active smoker. 8. Valvular heart disease: Echo in 3/13 showed moderate to severe eccentric MR and mild to moderate AI with prolapsing left coronary cusp. Echo 12/14 with mild MR and moderate AI.  Echo 4/16 with moderate AI and moderate MR.  - S/p Bentall procedure with bioprosthetic aortic valve 9/16.  - Echoes in 3/22 and 6/22 showed hyperdynamic LV with bioprosthetic aortic valve with mean gradient > 30.  - TEE (8/22): EF 60-65%, severe LVH, normal RV, bioprosthetic AoV that opened with minimal restriction, mean gradient 20 mmHg in setting of anesthesia and BP control, no AI.  Suspect small/undersized aortic valve. 9.  CVA: Has ILR. 10. Type B aortic dissection: Occurred in 2/16, from just distal to the left subclavian.  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    - 10/03/2014 First stage median sternotomy with right axillary cannulation for aortic valved conduit root replacement with coronary reconstruction (button Bentall Procedure), 27 mm Sorin Mitroflow bovine pericardial valved conduit, total arch replacement (63m Vascutek BSomaliamultibranch arch graft with individual head vessel reimplantation. - Status post  open repair of an Extent III TAA secondary to chronic dissection with a # 252mVascutek "Coselli" multibranch Dacron graft with individual celiac, SMA, and bilateral renal artery implantation on 01/04/2017, [facility].  11. CAD: LHC (4/16) with 60% ostial D1.   - NSTEMI 9/19: LHC (9/19) showed 50% proximal LCx, 90% ramus => DES to ramus.  - LHC (7/20):  D1 60% stenosis, patent ramus stent.  - Cardiolite (9/21): Inferior fixed defect, no ischemia.  - LHC (11/22): D1 95% stenosis, patient ramus stent. No interventional targets. 12. Sleep study (10/21) with no significant OSA.   Current Outpatient Medications  Medication Sig Dispense Refill   acetaminophen (TYLENOL) 325 MG tablet Take 650 mg by mouth every 6 (six) hours as needed for moderate pain (for pain or headaches).     amitriptyline (ELAVIL) 50 MG tablet Take 50 mg by mouth at bedtime.     amLODipine (NORVASC) 10 MG tablet Take 5 mg by mouth in the morning.     aspirin EC 81 MG EC tablet Take 1 tablet (81 mg total) by mouth daily. 90 tablet 3   atorvastatin (LIPITOR) 80 MG tablet Take 1 tablet (80 mg total) by mouth daily. 30 tablet 2   Blood Pressure Monitor KIT 1 Device by Does not apply route 2 (two) times daily. 1 kit 0   carvedilol (COREG) 25 MG tablet Take 25 mg by mouth in the morning.     furosemide (LASIX) 20 MG tablet Take 20 mg by mouth daily.     hydrALAZINE (APRESOLINE) 50 MG tablet Take 1.5 tablets (75 mg total) by mouth 3 (three) times daily. 135 tablet 1   minoxidil (LONITEN)  10 MG tablet Take 10 mg by mouth 2 (two) times daily.     Evolocumab (REPATHA SURECLICK) 502 MG/ML SOAJ Inject 1 Dose into the skin every 14 (fourteen) days. (Patient not taking: Reported on 02/04/2021) 2 mL 11   heparin 1000 unit/mL SOLN injection Heparin Sodium (Porcine) 1,000 Units/mL Systemic (Patient not taking: Reported on 02/28/2021)     lidocaine-prilocaine (EMLA) cream Apply 1 application topically as needed (prior to port being accessed). (Patient not taking: Reported on 02/28/2021)     Methoxy PEG-Epoetin Beta (MIRCERA IJ) Mircera     STIOLTO RESPIMAT 2.5-2.5 MCG/ACT AERS INHALE 2 PUFFS INTO THE LUNGS DAILY (Patient not taking: Reported on 02/04/2021) 4 g 5   VELPHORO 500 MG chewable tablet Chew 500 mg by mouth 3 (three) times daily. (Patient not taking: Reported on 02/28/2021)     No  current facility-administered medications for this encounter.    Allergies:   Imdur [isosorbide nitrate] and Tramadol   Social History:  The patient  reports that he has been smoking cigarettes. He has a 5.75 pack-year smoking history. He has never used smokeless tobacco. He reports that he does not currently use alcohol after a past usage of about 1.0 standard drink per week. He reports current drug use. Drug: Marijuana.   Family History:  The patient's family history includes Colon cancer in his paternal uncle; Diabetes in his maternal aunt; Headache in his sister; Heart attack in his brother; Hypertension in his brother, mother, and another family member; Lung cancer in his maternal uncle; Other in his sister; Stroke in his maternal aunt and maternal uncle; Thyroid disease in his sister.   ROS:  Please see the history of present illness.   All other systems are personally reviewed and negative.   Exam:   BP 140/88    Pulse (!) 115    Wt 70.4 kg (155 lb 3.2 oz)    SpO2 98%    BMI 21.65 kg/m  General:  NAD. No resp difficulty HEENT: Normal Neck: Supple. No JVD. Carotids 2+ bilat; no bruits. No lymphadenopathy or thryomegaly appreciated. Cor: PMI nondisplaced. Regular rate & rhythm. No rubs, gallops, 2/6 SEM RUSB Lungs: Clear, R chest TDC looks OK; TTP over lower sternum Abdomen: Soft, nontender, nondistended. No hepatosplenomegaly. No bruits or masses. Good bowel sounds. Extremities: No cyanosis, clubbing, rash, edema; L AVF +/+  w/ large hematoma Neuro: Alert & oriented x 3, cranial nerves grossly intact. Moves all 4 extremities w/o difficulty. Affect pleasant.  Recent Labs: 07/23/2020: B Natriuretic Peptide 334.1 09/05/2020: ALT 14; TSH 3.472 01/29/2021: BUN 22; Creatinine, Ser 6.16; Hemoglobin 10.6; Platelets 148; Potassium 3.8; Sodium 136  Personally reviewed   Wt Readings from Last 3 Encounters:  02/28/21 70.4 kg (155 lb 3.2 oz)  02/05/21 73.5 kg (162 lb)  02/03/21 73.7 kg (162 lb  8 oz)    ASSESSMENT AND PLAN:  1. CAD: 9/19 NSTEMI with PCI to proximal ramus.  LHC in 7/20 with no obstructive disease.  Admission in 9/21 with Cardiolite showing no ischemia.  He has had long-standing atypical chest pain, but over the last few months, he developed progressive exertional chest pain resolving with rest that is reproducible after walking about 50 yards.  Concern for coronary ischemia, he underwent LHC 11/22, showing small high 1st diagonal with diffuse severe disease, similar to prior, patent ramus stent. Otherwise, nonobstructive disease. His chest discomfort is reproducible w/ palpation today and seems to be worse with deep inhalation. Suspect this could me MSK/related  to prior surgeries.  Unable to tolerate long-acting nitrate. - Continue ASA 81.  - Continue atorvastatin and Repatha (Lipid clinic is working on PA for this).    2. 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 Myeloma panel negative and PYP scan negative, suspect due more likely to HTN than hypertrophic cardiomyopathy. Cannot get cardiac MRI with elevated creatinine.  Most recent study was a TEE in 8/22 showing EF 60-65% with severe LVH.  The LVH is likely due to long-standing HTN.  NYHA class II symptoms. He is not volume overloaded on exam, volume managed by HD. - Continue Coreg 25 mg bid. Stressed need to take twice a day. 3. HTN: BP more controlled today.  Renal artery dopplers in 3/22 were not suggestive of significant renal artery stenosis.  Sleep study in 10/21 was negative.  - Continue Coreg 25 mg bid. Take twice a day. - Continue hydralazine 75 mg tid.  - Continue amlodipine 5 mg daily. Consider increasing next if BP remains elevated. 4. S/P Bioprothetic AVR: Echoes in 3/22 and 6/22 showed hyperdynamic LV with mean aortic valve gradient > 30.  TEE was done to investigate.  This showed bioprosthetic AoV that opened with minimal restriction,  mean gradient 20 mmHg in setting of anesthesia and BP control, no AI.  Suspect small/undersized aortic valve.  5. S/P 2016 and 2018 thoracic aortic aneurysm repair: Followed by Dr. [name] at [facility].  - He has not been seen at [facility] for several years, needs followup of aorta => he has follow up 05/2021. He was given appt date/time and office number. 6. ESRD: Think this was due to controlled HTN. On iHD T/TH/Sat, followed by [facility]. 7. Active smoker: Still smoking.  Has to quit before he can get a renal transplant.  - I advised he restart his LABA/LAMA inhaler.  Followup in 6 months with Dr. Aundra Dubin   Signed, Rafael Bihari, FNP  02/28/2021  [facility] [address] [facility] and [place] [address] [phone] (office) [phone] (fax)

## 2021-02-28 ENCOUNTER — Ambulatory Visit (HOSPITAL_COMMUNITY)
Admission: RE | Admit: 2021-02-28 | Discharge: 2021-02-28 | Disposition: A | Discharge status: Home / Self Care | Payer: Medicare Other | Source: Ambulatory Visit | Attending: Family Medicine | Admitting: Family Medicine

## 2021-02-28 ENCOUNTER — Other Ambulatory Visit: Payer: Self-pay

## 2021-02-28 ENCOUNTER — Encounter (HOSPITAL_COMMUNITY): Payer: Self-pay

## 2021-02-28 ENCOUNTER — Other Ambulatory Visit: Payer: Self-pay | Admitting: Internal Medicine

## 2021-02-28 VITALS — BP 140/88 | HR 115 | Wt 155.2 lb

## 2021-02-28 DIAGNOSIS — Z79899 Other long term (current) drug therapy: Secondary | ICD-10-CM | POA: Insufficient documentation

## 2021-02-28 DIAGNOSIS — R0789 Other chest pain: Secondary | ICD-10-CM | POA: Insufficient documentation

## 2021-02-28 DIAGNOSIS — I5042 Chronic combined systolic (congestive) and diastolic (congestive) heart failure: Secondary | ICD-10-CM | POA: Insufficient documentation

## 2021-02-28 DIAGNOSIS — I252 Old myocardial infarction: Secondary | ICD-10-CM | POA: Insufficient documentation

## 2021-02-28 DIAGNOSIS — I132 Hypertensive heart and chronic kidney disease with heart failure and with stage 5 chronic kidney disease, or end stage renal disease: Secondary | ICD-10-CM | POA: Insufficient documentation

## 2021-02-28 DIAGNOSIS — Z992 Dependence on renal dialysis: Secondary | ICD-10-CM | POA: Insufficient documentation

## 2021-02-28 DIAGNOSIS — Z7901 Long term (current) use of anticoagulants: Secondary | ICD-10-CM | POA: Insufficient documentation

## 2021-02-28 DIAGNOSIS — I10 Essential (primary) hypertension: Secondary | ICD-10-CM

## 2021-02-28 DIAGNOSIS — Z720 Tobacco use: Secondary | ICD-10-CM

## 2021-02-28 DIAGNOSIS — I2510 Atherosclerotic heart disease of native coronary artery without angina pectoris: Secondary | ICD-10-CM | POA: Insufficient documentation

## 2021-02-28 DIAGNOSIS — E785 Hyperlipidemia, unspecified: Secondary | ICD-10-CM | POA: Insufficient documentation

## 2021-02-28 DIAGNOSIS — Z09 Encounter for follow-up examination after completed treatment for conditions other than malignant neoplasm: Secondary | ICD-10-CM | POA: Diagnosis not present

## 2021-02-28 DIAGNOSIS — Z8673 Personal history of transient ischemic attack (TIA), and cerebral infarction without residual deficits: Secondary | ICD-10-CM | POA: Diagnosis not present

## 2021-02-28 DIAGNOSIS — R42 Dizziness and giddiness: Secondary | ICD-10-CM | POA: Diagnosis present

## 2021-02-28 DIAGNOSIS — F17210 Nicotine dependence, cigarettes, uncomplicated: Secondary | ICD-10-CM | POA: Diagnosis not present

## 2021-02-28 DIAGNOSIS — I5022 Chronic systolic (congestive) heart failure: Secondary | ICD-10-CM | POA: Diagnosis not present

## 2021-02-28 DIAGNOSIS — Z9889 Other specified postprocedural states: Secondary | ICD-10-CM

## 2021-02-28 DIAGNOSIS — Z953 Presence of xenogenic heart valve: Secondary | ICD-10-CM | POA: Diagnosis not present

## 2021-02-28 DIAGNOSIS — N186 End stage renal disease: Secondary | ICD-10-CM | POA: Diagnosis not present

## 2021-02-28 DIAGNOSIS — I359 Nonrheumatic aortic valve disorder, unspecified: Secondary | ICD-10-CM

## 2021-02-28 MED ORDER — STIOLTO RESPIMAT 2.5-2.5 MCG/ACT IN AERS
2.0000 | INHALATION_SPRAY | Freq: Every day | RESPIRATORY_TRACT | 0 refills | Status: DC
Start: 2021-02-28 — End: 2022-12-24

## 2021-02-28 NOTE — Patient Instructions (Signed)
It was great to see you today! No medication changes are needed at this time. -be sure to pick up your inhaler  You have a  virtual/telehelth appointment with Dr Berneta Levins on 05/23/2021 Please call [phone] with any questions  Your physician recommends that you schedule a follow-up appointment in: 6 months with Dr Aundra Dubin  Do the following things EVERYDAY: Weigh yourself in the morning before breakfast. Write it down and keep it in a log. Take your medicines as prescribed Eat low salt foods--Limit salt (sodium) to 2000 mg per day.  Stay as active as you can everyday Limit all fluids for the day to less than 2 liters  At the [facility], you and your health needs are our priority. As part of our continuing mission to provide you with exceptional heart care, we have created designated Provider Care Teams. These Care Teams include your primary Cardiologist (physician) and Advanced Practice Providers (APPs- Physician Assistants and Nurse Practitioners) who all work together to provide you with the care you need, when you need it.   You may see any of the following providers on your designated Care Team at your next follow up: Dr Glori Bickers Dr Haynes Kerns, NP Lyda Jester, [place] [facility] [place], [place] Audry Riles, PharmD   Please be sure to bring in all your medications bottles to every appointment.

## 2021-03-03 ENCOUNTER — Other Ambulatory Visit: Payer: Self-pay

## 2021-03-07 ENCOUNTER — Encounter (HOSPITAL_COMMUNITY): Payer: Self-pay | Admitting: Vascular Surgery

## 2021-03-07 ENCOUNTER — Other Ambulatory Visit: Payer: Self-pay

## 2021-03-07 ENCOUNTER — Encounter (HOSPITAL_COMMUNITY)
Admission: RE | Disposition: A | Discharge status: Home / Self Care | Payer: Self-pay | Source: Home / Self Care | Attending: Vascular Surgery

## 2021-03-07 ENCOUNTER — Ambulatory Visit (HOSPITAL_COMMUNITY)
Admission: RE | Admit: 2021-03-07 | Discharge: 2021-03-07 | Disposition: A | Discharge status: Home / Self Care | Payer: Medicare Other | Attending: Vascular Surgery | Admitting: Vascular Surgery

## 2021-03-07 DIAGNOSIS — T82858A Stenosis of vascular prosthetic devices, implants and grafts, initial encounter: Secondary | ICD-10-CM | POA: Diagnosis present

## 2021-03-07 DIAGNOSIS — Y832 Surgical operation with anastomosis, bypass or graft as the cause of abnormal reaction of the patient, or of later complication, without mention of misadventure at the time of the procedure: Secondary | ICD-10-CM | POA: Diagnosis not present

## 2021-03-07 DIAGNOSIS — F17210 Nicotine dependence, cigarettes, uncomplicated: Secondary | ICD-10-CM | POA: Diagnosis not present

## 2021-03-07 DIAGNOSIS — Z992 Dependence on renal dialysis: Secondary | ICD-10-CM

## 2021-03-07 DIAGNOSIS — N186 End stage renal disease: Secondary | ICD-10-CM | POA: Diagnosis not present

## 2021-03-07 DIAGNOSIS — T82898A Other specified complication of vascular prosthetic devices, implants and grafts, initial encounter: Secondary | ICD-10-CM | POA: Diagnosis not present

## 2021-03-07 HISTORY — PX: PERIPHERAL VASCULAR BALLOON ANGIOPLASTY: CATH118281

## 2021-03-07 HISTORY — PX: A/V FISTULAGRAM: CATH118298

## 2021-03-07 LAB — POCT I-STAT, CHEM 8
BUN: 50 mg/dL — ABNORMAL HIGH (ref 6–20)
Calcium, Ion: 1.16 mmol/L (ref 1.15–1.40)
Chloride: 102 mmol/L (ref 98–111)
Creatinine, Ser: 9.1 mg/dL — ABNORMAL HIGH (ref 0.61–1.24)
Glucose, Bld: 75 mg/dL (ref 70–99)
HCT: 33 % — ABNORMAL LOW (ref 39.0–52.0)
Hemoglobin: 11.2 g/dL — ABNORMAL LOW (ref 13.0–17.0)
Potassium: 4.4 mmol/L (ref 3.5–5.1)
Sodium: 141 mmol/L (ref 135–145)
TCO2: 27 mmol/L (ref 22–32)

## 2021-03-07 SURGERY — A/V FISTULAGRAM
Anesthesia: LOCAL | Laterality: Left

## 2021-03-07 MED ORDER — SODIUM CHLORIDE 0.9% FLUSH: 3.0000 mL | Freq: Two times a day (BID) | INTRAVENOUS | Status: DC

## 2021-03-07 MED ORDER — SODIUM CHLORIDE 0.9 % IV SOLN: 250.0000 mL | INTRAVENOUS | Status: DC | PRN

## 2021-03-07 MED ORDER — MIDAZOLAM HCL 2 MG/2ML IJ SOLN
INTRAMUSCULAR | Status: DC | PRN
  Administered 2021-03-07: 1 mg via INTRAVENOUS

## 2021-03-07 MED ORDER — HEPARIN SODIUM (PORCINE) 1000 UNIT/ML IJ SOLN
INTRAMUSCULAR | Status: DC | PRN
  Administered 2021-03-07: 2000 [IU] via INTRAVENOUS

## 2021-03-07 MED ORDER — LIDOCAINE HCL (PF) 1 % IJ SOLN
INTRAMUSCULAR | Status: AC
  Filled 2021-03-07: qty 30

## 2021-03-07 MED ORDER — HEPARIN (PORCINE) IN NACL 1000-0.9 UT/500ML-% IV SOLN
INTRAVENOUS | Status: AC
  Filled 2021-03-07: qty 500

## 2021-03-07 MED ORDER — HEPARIN (PORCINE) IN NACL 1000-0.9 UT/500ML-% IV SOLN
INTRAVENOUS | Status: DC | PRN
  Administered 2021-03-07: 500 mL

## 2021-03-07 MED ORDER — IODIXANOL 320 MG/ML IV SOLN
INTRAVENOUS | Status: DC | PRN
  Administered 2021-03-07: 10:00:00 85 mL

## 2021-03-07 MED ORDER — SODIUM CHLORIDE 0.9% FLUSH: 3.0000 mL | INTRAVENOUS | Status: DC | PRN

## 2021-03-07 MED ORDER — FENTANYL CITRATE (PF) 100 MCG/2ML IJ SOLN
INTRAMUSCULAR | Status: DC | PRN
  Administered 2021-03-07: 50 ug via INTRAVENOUS

## 2021-03-07 MED ORDER — MIDAZOLAM HCL 2 MG/2ML IJ SOLN
INTRAMUSCULAR | Status: AC
  Filled 2021-03-07: qty 2

## 2021-03-07 MED ORDER — FENTANYL CITRATE (PF) 100 MCG/2ML IJ SOLN
INTRAMUSCULAR | Status: AC
  Filled 2021-03-07: qty 2

## 2021-03-07 MED ORDER — LIDOCAINE HCL (PF) 1 % IJ SOLN
INTRAMUSCULAR | Status: DC | PRN
  Administered 2021-03-07: 3 mL

## 2021-03-07 SURGICAL SUPPLY — 22 items
BALLN MUSTANG 10X60X75 (BALLOONS) ×4
BALLN MUSTANG 7.0X20 75 (BALLOONS) ×3
BALLN MUSTANG 7.0X20 75CM (BALLOONS) ×1
BALLN MUSTANG 8X60X75 (BALLOONS) ×4
BALLOON MUSTANG 10X60X75 (BALLOONS) IMPLANT
BALLOON MUSTANG 7.0X20 75 (BALLOONS) IMPLANT
BALLOON MUSTANG 8X60X75 (BALLOONS) IMPLANT
CATH ANGIO 5F BER2 65CM (CATHETERS) ×2 IMPLANT
COVER DOME SNAP 22 D (MISCELLANEOUS) ×5 IMPLANT
DEVICE TORQUE .025-.038 (MISCELLANEOUS) ×2 IMPLANT
GUIDEWIRE ANGLED .035X150CM (WIRE) ×2 IMPLANT
KIT ENCORE 26 ADVANTAGE (KITS) ×2 IMPLANT
PROTECTION STATION PRESSURIZED (MISCELLANEOUS) ×4
SHEATH PINNACLE R/O II 6F 4CM (SHEATH) ×2 IMPLANT
SHEATH PROBE COVER 6X72 (BAG) ×5 IMPLANT
STATION PROTECTION PRESSURIZED (MISCELLANEOUS) ×3 IMPLANT
STOPCOCK MORSE 400PSI 3WAY (MISCELLANEOUS) ×5 IMPLANT
TRAY PV CATH (CUSTOM PROCEDURE TRAY) ×5 IMPLANT
TUBING CIL FLEX 10 FLL-RA (TUBING) ×5 IMPLANT
WIRE BENTSON .035X145CM (WIRE) ×2 IMPLANT
WIRE MICRO SET SILHO 5FR 7 (SHEATH) ×2 IMPLANT
WIRE ROSEN-J .035X180CM (WIRE) ×2 IMPLANT

## 2021-03-07 NOTE — H&P (Signed)
ASSESSMENT & PLAN   END-STAGE RENAL DISEASE: The dialysis unit is having problems with his left basilic vein transposition.  We will proceed with a fistulogram to further evaluate.  We have discussed the indications for the procedure and the potential complications and he is agreeable to proceed.  If there is something amenable to venoplasty we would address this at the same time.    REASON FOR CONSULT:    Poorly functioning left basilic vein transposition  HPI:   [name] is a 50 y.o. male who was last seen in our office on 03/05/2021.  He has a left brachial basilic fistula.  He experienced an infiltrate about 4 to 6 weeks ago.  At that time he was dialyzing via a right IJ tunneled dialysis catheter on Tuesdays Thursdays and Saturdays.  His hematoma was slowly improving.  On exam there was a palpable thrill in the fistula.  It was felt that he would not need another 2 to 4 weeks before the fistula could be cannulated.  He was to follow-up as needed.  The patient was referred by Dr. Edrick Oh as his access was "pulling clots and they were unable to cannulate."  Past Medical History:  Diagnosis Date   Adenomatous colon polyp 08/2015   Anxiety    Aortic disease ([place])    Aortic dissection ([place])    a. admx 04/2014 >> L renal infarct; a/c renal failure >> b.  s/p Bioprosthetic Bentall and total arch replacement and staged endovascular repair of descending aortic aneurysm ([facility] - Dr. [name])   CAD (coronary artery disease)    a. LHC 4/16:  oD1 60%   Cardiomyopathy ([place])    a. non-ischemic - probably related to untreated HTN and ETOH abuse - Echo 3/13 with EF 35-40% >> b. Echo 4/16: Severe LVH, EF 55-60%, moderate AI, moderate MR, mild LAE, trivial effusion, known type B dissection with communication between true and false lumens with suprasternal images suggesting dissection plane may propagate to at least left subclavian takeoff, root above aortic valve okay     Chronic abdominal pain     Chronic combined systolic and diastolic congestive heart failure ([place])    a. 05/2011: Adm with pulm edema/HTN urgency, EF 35-40% with diffuse hypokinesis and moderate to severe mitral regurgitation. Cardiomyopathy likely due to uncontrolled HTN and ETOH abuse - cath deferred due to renal insufficiency (felt due to uncontrolled HTN). bJodie Echevaria MV 06/2011: EF 37% and no ischemia or infarction. c. EF 45-50% by echo 01/2012.   Chronic sinusitis    CKD (chronic kidney disease)    dialysis m,t,th,fri   DDD (degenerative disc disease), lumbar    Depression    Descending thoracic aortic aneurysm    Dissecting aneurysm of thoracic aorta    ETOH abuse    a. Reported to have quit 05/2011.   Frequent headaches    GERD (gastroesophageal reflux disease)    Headache(784.0)    "q other day" (08/08/2013)   Heart murmur    Hemorrhoid thrombosis    History of echocardiogram    Echo 1/17:  Severe LVH, EF 55-60%, no RWMA, Gr 2 DD, AVR ok, mild to mod MR, mild LAE, mild reduced RVSF, mod RAE   History of medication noncompliance    HYPERLIPIDEMIA    Hypertension    a. Hx of HTN urgency secondary to noncompliance. b. urinary metanephrine and catecholeamine levels normal 2013.  c. Renal art [name] 1/16:  No evidence of renal artery stenosis noted  bilaterally.   INGUINAL HERNIA    Pneumonia ~ 2013   Renal insufficiency    Serrated adenoma of colon 08/2015   Stroke [facility])    Tobacco abuse    Valvular heart disease    a. Echo 05/2011: moderate to severe eccentric MR and mild to moderate AI with prolapsing left coronary cusp. b. Echo 01/2012: mild-mod AI, mild dilitation of aortic root, mild MR.;  c. Echo 1/16: Severe LVH consistent with hypertrophic cardio myopathy, EF 50%, no RWMA, mod AI, mild MR, mild RAE, dilated Ao root (40 mm);     Family History  Problem Relation Age of Onset   Hypertension Mother    Hypertension Other    Colon cancer Paternal Uncle    Stroke Maternal Aunt    Heart attack Brother     Hypertension Brother    Diabetes Maternal Aunt    Lung cancer Maternal Uncle    Stroke Maternal Uncle    Other Sister        "breathing machine at night"   Headache Sister    Thyroid disease Sister    Throat cancer Neg Hx    Pancreatic cancer Neg Hx    Esophageal cancer Neg Hx    Kidney disease Neg Hx    Liver disease Neg Hx     SOCIAL HISTORY: Social History   Tobacco Use   Smoking status: Some Days    Packs/day: 0.25    Years: 23.00    Pack years: 5.75    Types: Cigarettes   Smokeless tobacco: Never   Tobacco comments:    3 to 4 per day  Substance Use Topics   Alcohol use: Not Currently    Alcohol/week: 1.0 standard drink    Types: 1 Cans of beer per week    Comment: On special occasion    Allergies  Allergen Reactions   Imdur [Isosorbide Nitrate] Other (See Comments)    Headaches -- patient instructed to continue taking    Tramadol Other (See Comments)    Headaches     Current Facility-Administered Medications  Medication Dose Route Frequency Provider Last Rate Last Admin   0.9 %  sodium chloride infusion  250 mL Intravenous PRN Angelia Mould, MD       sodium chloride flush (NS) 0.9 % injection 3 mL  3 mL Intravenous Q12H Angelia Mould, MD       sodium chloride flush (NS) 0.9 % injection 3 mL  3 mL Intravenous PRN Angelia Mould, MD        REVIEW OF SYSTEMS:  [X]  denotes positive finding, [ ]  denotes negative finding Cardiac  Comments:  Chest pain or chest pressure:    Shortness of breath upon exertion:    Short of breath when lying flat:    Irregular heart rhythm:        Vascular    Pain in calf, thigh, or hip brought on by ambulation:    Pain in feet at night that wakes you up from your sleep:     Blood clot in your veins:    Leg swelling:         Pulmonary    Oxygen at home:    Productive cough:     Wheezing:         Neurologic    Sudden weakness in arms or legs:     Sudden numbness in arms or legs:     Sudden  onset of difficulty speaking or slurred speech:  Temporary loss of vision in one eye:     Problems with dizziness:         Gastrointestinal    Blood in stool:     Vomited blood:         Genitourinary    Burning when urinating:     Blood in urine:        Psychiatric    Major depression:         Hematologic    Bleeding problems:    Problems with blood clotting too easily:        Skin    Rashes or ulcers:        Constitutional    Fever or chills:    -  PHYSICAL EXAM:   Vitals:   03/07/21 0639  BP: (!) 164/102  Pulse: 75  Resp: 20  Temp: 97.9 F (36.6 C)  TempSrc: Oral  SpO2: 100%  Weight: 74.4 kg  Height: 5\' 11"  (1.803 m)   Body mass index is 22.87 kg/m. GENERAL: The patient is a well-nourished male, in no acute distress. The vital signs are documented above. CARDIAC: There is a regular rate and rhythm.  VASCULAR: He has a palpable thrill in his left AV fistula. PULMONARY: There is good air exchange bilaterally without wheezing or rales. ABDOMEN: Soft and non-tender with normal pitched bowel sounds.  MUSCULOSKELETAL: There are no major deformities. NEUROLOGIC: No focal weakness or paresthesias are detected. SKIN: There are no ulcers or rashes noted. PSYCHIATRIC: The patient has a normal affect.  DATA:    LABS: His potassium today is 4.4.   [name] [facility]

## 2021-03-07 NOTE — Op Note (Signed)
° °  PATIENT: [name]      MRN: [id number] DOB: 1970/07/23    DATE OF PROCEDURE: 03/07/2021  INDICATIONS:    [name] is a 50 y.o. male with a poorly functioning left upper arm brachial basilic fistula which infiltrated.  He was set up for a fistulogram.  PROCEDURE:    Ultrasound-guided access to the left basilic vein transposition Fistulogram left basilic vein transposition Angioplasty of left brachiocephalic vein stenosis (10 x 60 Mustang balloon) Angioplasty of the left subclavian vein with a 10 x 60 Mustang balloon Angioplasty of the left basilic vein transposition with an 8 x 10 Mustang balloon and a 7 x 2 Mustang balloon  SURGEON: Judeth Cornfield. Scot Dock, MD, FACS  ANESTHESIA: Local with sedation  EBL: Minimal  TECHNIQUE: The patient was brought to the peripheral vascular lab and was sedated. The period of conscious sedation was 59 minutes.  During that time period, I was present face-to-face 100% of the time.  The patient was administered 1 mg of Versed and 50 mcg of fentanyl. The patient's heart rate, blood pressure, and oxygen saturation were monitored by the nurse continuously during the procedure.  The left arm was prepped and draped in usual sterile fashion.  Under ultrasound guidance, after the skin was anesthetized, I cannulated the proximal fistula with a micropuncture needle and a micropuncture sheath was introduced over a wire.  A fistulogram was then obtained to evaluate the fistula from the central veins to include the upper arm.  There was a long left brachiocephalic vein stenosis.  I elected to address this with angioplasty.  Ice changed to the micropuncture sheath to a short 6 [place] sheath over a Bentson wire.  Using an angled Glidewire was able to get to the stenosis in the left brachiocephalic vein.  The patient was heparinized.  Initially selected an 8 x 60 Mustang balloon which was positioned across the left brachiocephalic vein stenosis and inflated to rated  burst pressure for 2 minutes.  There was some residual stenosis.  I went back with a 10 x 60 Mustang balloon which was inflated to rated burst pressure for 2 minutes.  There was also stenosis at the subclavian vein which was addressed with the 10 x 60 Mustang balloon for 2 minutes.  In addition there was 2 areas of stenosis in the fistula itself in the mid upper arm which were addressed with an 8 x 60 Mustang balloon.  There were 2 areas that were more resilient which were treated with a 7 x 2 balloon and then the 8 x 60 balloon again.  At the completion there was an excellent thrill in the fistula which was much improved.  We did do a reflux shot which showed no significant problem at the arterial anastomosis.  FINDINGS:   Patent left basilic vein transposition No problems at the arterial anastomosis Long segment 70% stenosis of the left brachiocephalic vein which was successfully angioplastied with a 10 x 60 balloon 80% stenosis of the subclavian vein which was successfully ballooned with a 10 x 60 balloon. 2 tandem areas of 70% stenosis in the basilic vein which was successfully ballooned with an 8 x 60 balloon.  [name], MD, FACS [facility]  DATE OF DICTATION:   03/07/2021

## 2021-03-11 MED ORDER — REPATHA SURECLICK 140 MG/ML ~~LOC~~ SOAJ: 1.0000 | SUBCUTANEOUS | 11 refills | Status: DC

## 2021-03-11 NOTE — Telephone Encounter (Addendum)
Received fax from [employer] that [place] appeal was successful.  Refilled Rx

## 2021-03-11 NOTE — Addendum Note (Signed)
Addended by: Rollen Sox on: 03/11/2021 04:59 PM   Modules accepted: Orders

## 2021-04-21 ENCOUNTER — Encounter (HOSPITAL_COMMUNITY): Payer: Self-pay

## 2021-04-21 ENCOUNTER — Emergency Department (HOSPITAL_COMMUNITY): Payer: Medicare Other

## 2021-04-21 ENCOUNTER — Emergency Department (HOSPITAL_COMMUNITY)
Admission: EM | Admit: 2021-04-21 | Discharge: 2021-04-21 | Disposition: A | Discharge status: Home / Self Care | Payer: Medicare Other | Attending: Emergency Medicine | Admitting: Emergency Medicine

## 2021-04-21 DIAGNOSIS — Z5321 Procedure and treatment not carried out due to patient leaving prior to being seen by health care provider: Secondary | ICD-10-CM | POA: Diagnosis not present

## 2021-04-21 DIAGNOSIS — R079 Chest pain, unspecified: Secondary | ICD-10-CM | POA: Insufficient documentation

## 2021-04-21 LAB — BASIC METABOLIC PANEL WITH GFR
Anion gap: 12 (ref 5–15)
BUN: 57 mg/dL — ABNORMAL HIGH (ref 6–20)
CO2: 21 mmol/L — ABNORMAL LOW (ref 22–32)
Calcium: 9.3 mg/dL (ref 8.9–10.3)
Chloride: 106 mmol/L (ref 98–111)
Creatinine, Ser: 9.52 mg/dL — ABNORMAL HIGH (ref 0.61–1.24)
GFR, Estimated: 6 mL/min — ABNORMAL LOW
Glucose, Bld: 101 mg/dL — ABNORMAL HIGH (ref 70–99)
Potassium: 3.8 mmol/L (ref 3.5–5.1)
Sodium: 139 mmol/L (ref 135–145)

## 2021-04-21 LAB — I-STAT CHEM 8, ED
BUN: 59 mg/dL — ABNORMAL HIGH (ref 6–20)
Calcium, Ion: 1.23 mmol/L (ref 1.15–1.40)
Chloride: 107 mmol/L (ref 98–111)
Creatinine, Ser: 10.3 mg/dL — ABNORMAL HIGH (ref 0.61–1.24)
Glucose, Bld: 99 mg/dL (ref 70–99)
HCT: 38 % — ABNORMAL LOW (ref 39.0–52.0)
Hemoglobin: 12.9 g/dL — ABNORMAL LOW (ref 13.0–17.0)
Potassium: 3.8 mmol/L (ref 3.5–5.1)
Sodium: 141 mmol/L (ref 135–145)
TCO2: 25 mmol/L (ref 22–32)

## 2021-04-21 LAB — CBC
HCT: 37.4 % — ABNORMAL LOW (ref 39.0–52.0)
Hemoglobin: 12.2 g/dL — ABNORMAL LOW (ref 13.0–17.0)
MCH: 33 pg (ref 26.0–34.0)
MCHC: 32.6 g/dL (ref 30.0–36.0)
MCV: 101.1 fL — ABNORMAL HIGH (ref 80.0–100.0)
Platelets: 129 K/uL — ABNORMAL LOW (ref 150–400)
RBC: 3.7 MIL/uL — ABNORMAL LOW (ref 4.22–5.81)
RDW: 13.3 % (ref 11.5–15.5)
WBC: 6.1 K/uL (ref 4.0–10.5)
nRBC: 0 % (ref 0.0–0.2)

## 2021-04-21 LAB — TROPONIN I (HIGH SENSITIVITY): Troponin I (High Sensitivity): 37 ng/L — ABNORMAL HIGH (ref <18)

## 2021-04-21 NOTE — ED Triage Notes (Signed)
Pt via [facility], sudden-onset sharp, central CP that radiates to L arm approx 2030. Hx MI 37yrs ago, stents placed. Dialysis T/Th, compliant. 324 ASA, 10/10 to 7/10 after ASA. Denied NTG. No injury, no sick contact  HR 106 150/84 97% RA 20R FA

## 2021-04-21 NOTE — ED Notes (Signed)
Pt LWBS 

## 2021-05-21 IMAGING — CR CHEST - 2 VIEW
2 series · 2 of 2 positions shown · non-contrast
Comparison: 09/14/2018, 03/25/2018, CT 12/02/2017, radiograph
01/31/2017

CLINICAL DATA: Chest pain

EXAM:
CHEST - 2 VIEW

[chest pa]
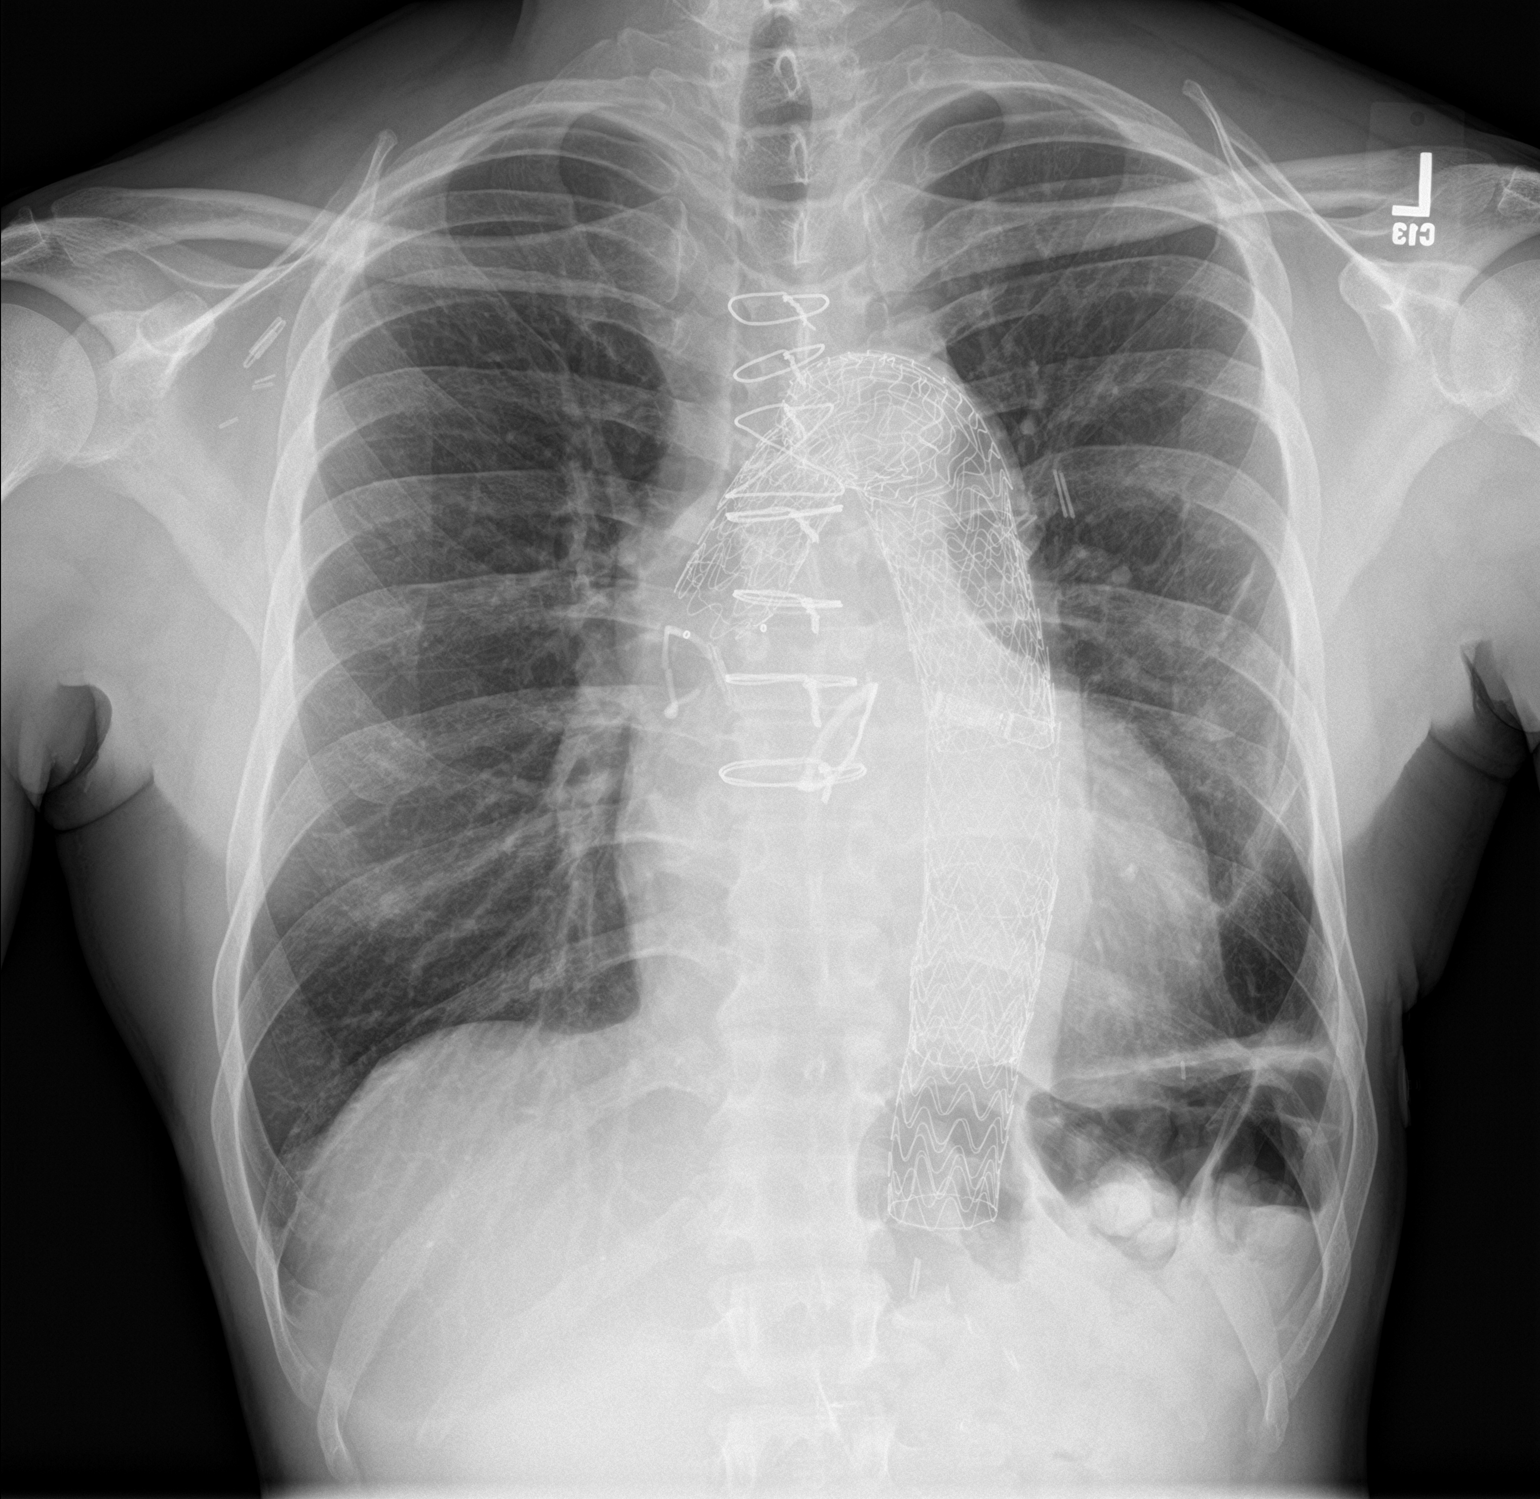

[chest lat]
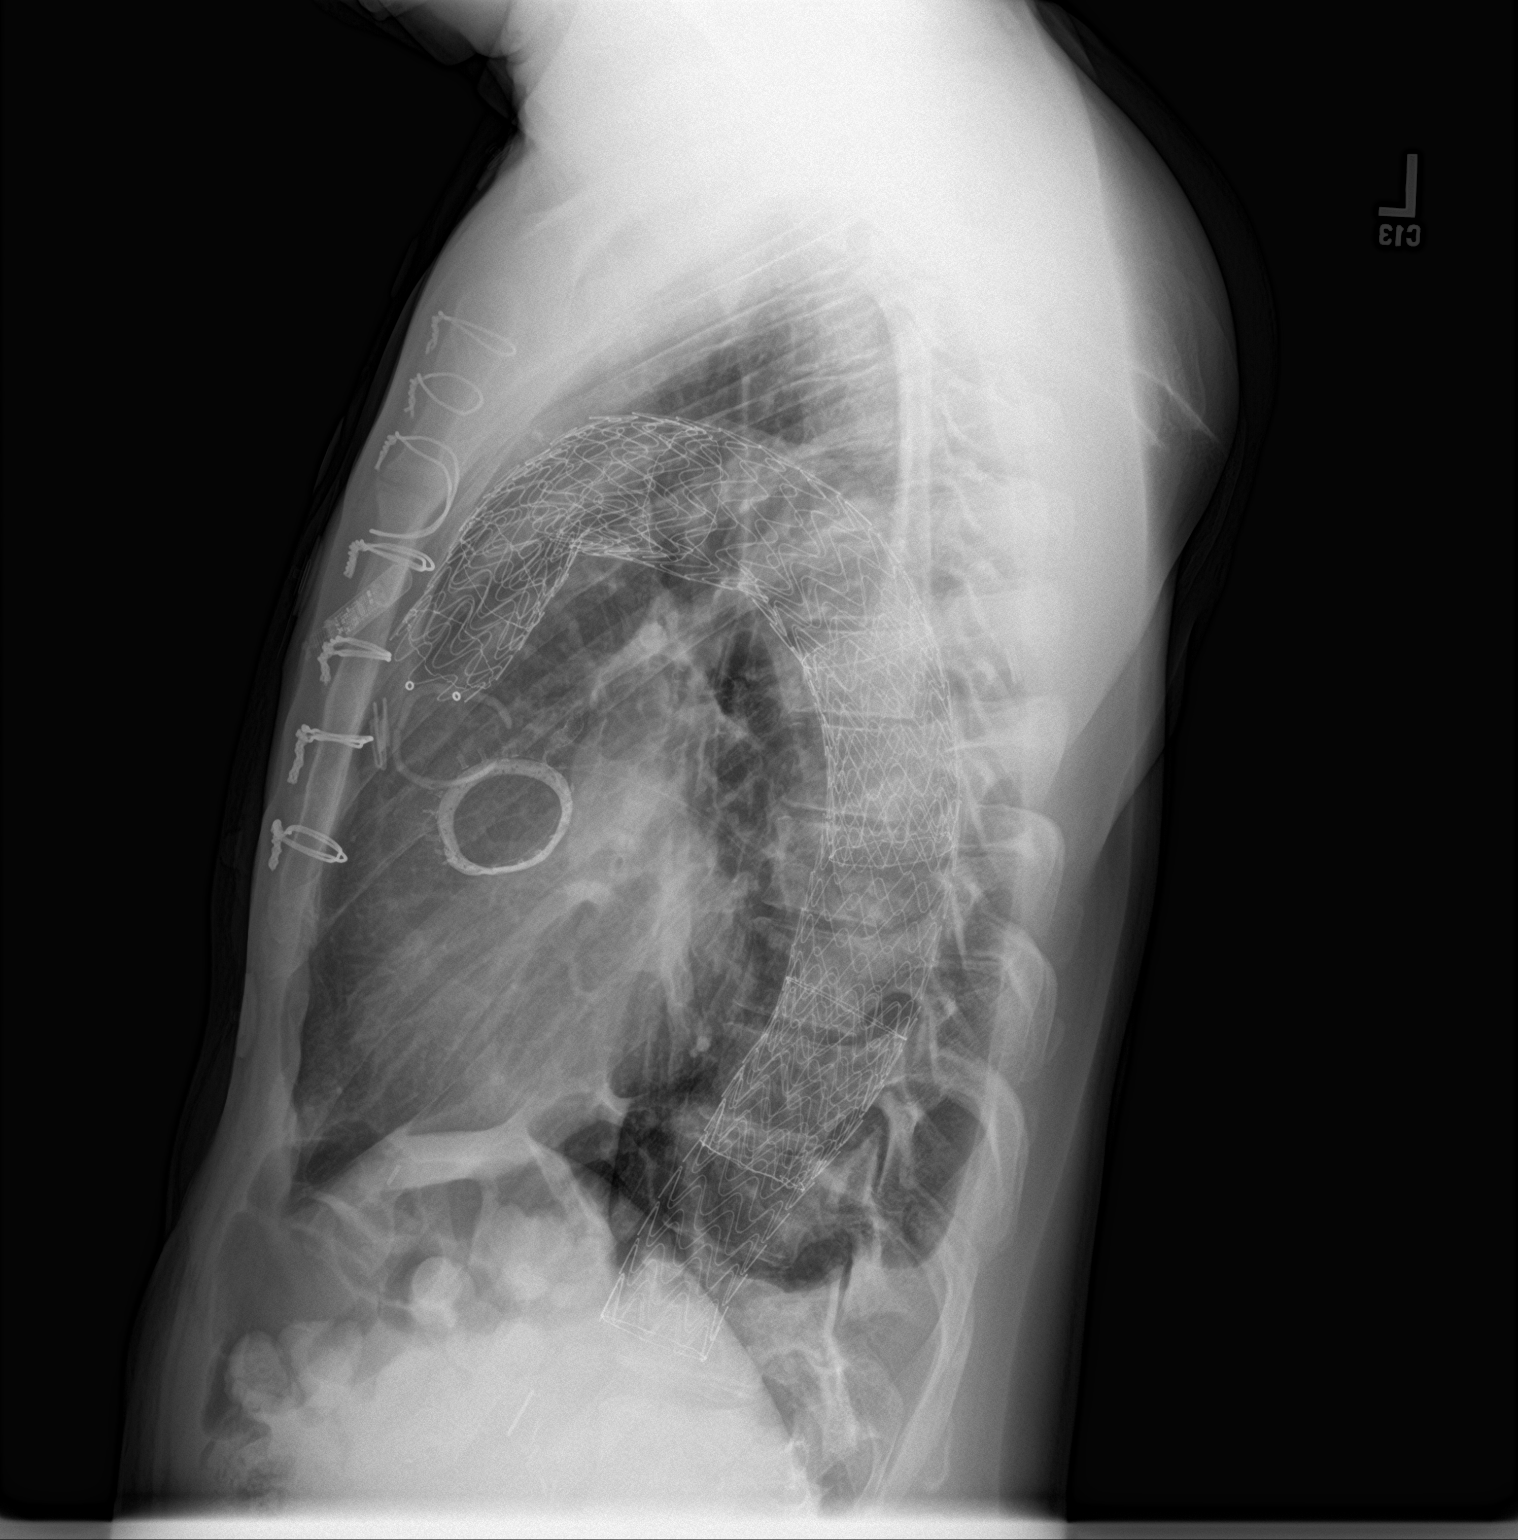

[2 of 2 positions shown; findings below may reference images not displayed]

FINDINGS: Post sternotomy changes with aortic valve and root prostheses.
Similar appearance of thoracic aortic stent graft. Stable pleural
and parenchymal scarring at the left base. No acute airspace disease
or effusion. No pneumothorax. Stable mild cardiomegaly.
IMPRESSION: No active cardiopulmonary disease. Stable postsurgical changes of
the mediastinum with pleural and parenchymal scarring at the left
base

## 2021-05-22 IMAGING — CR CHEST - 2 VIEW
2 series · 2 of 2 positions shown · non-contrast
Comparison: 09/23/2018

CLINICAL DATA: Chest pain, headache, hypertension

EXAM:
CHEST - 2 VIEW

[w chest pa]
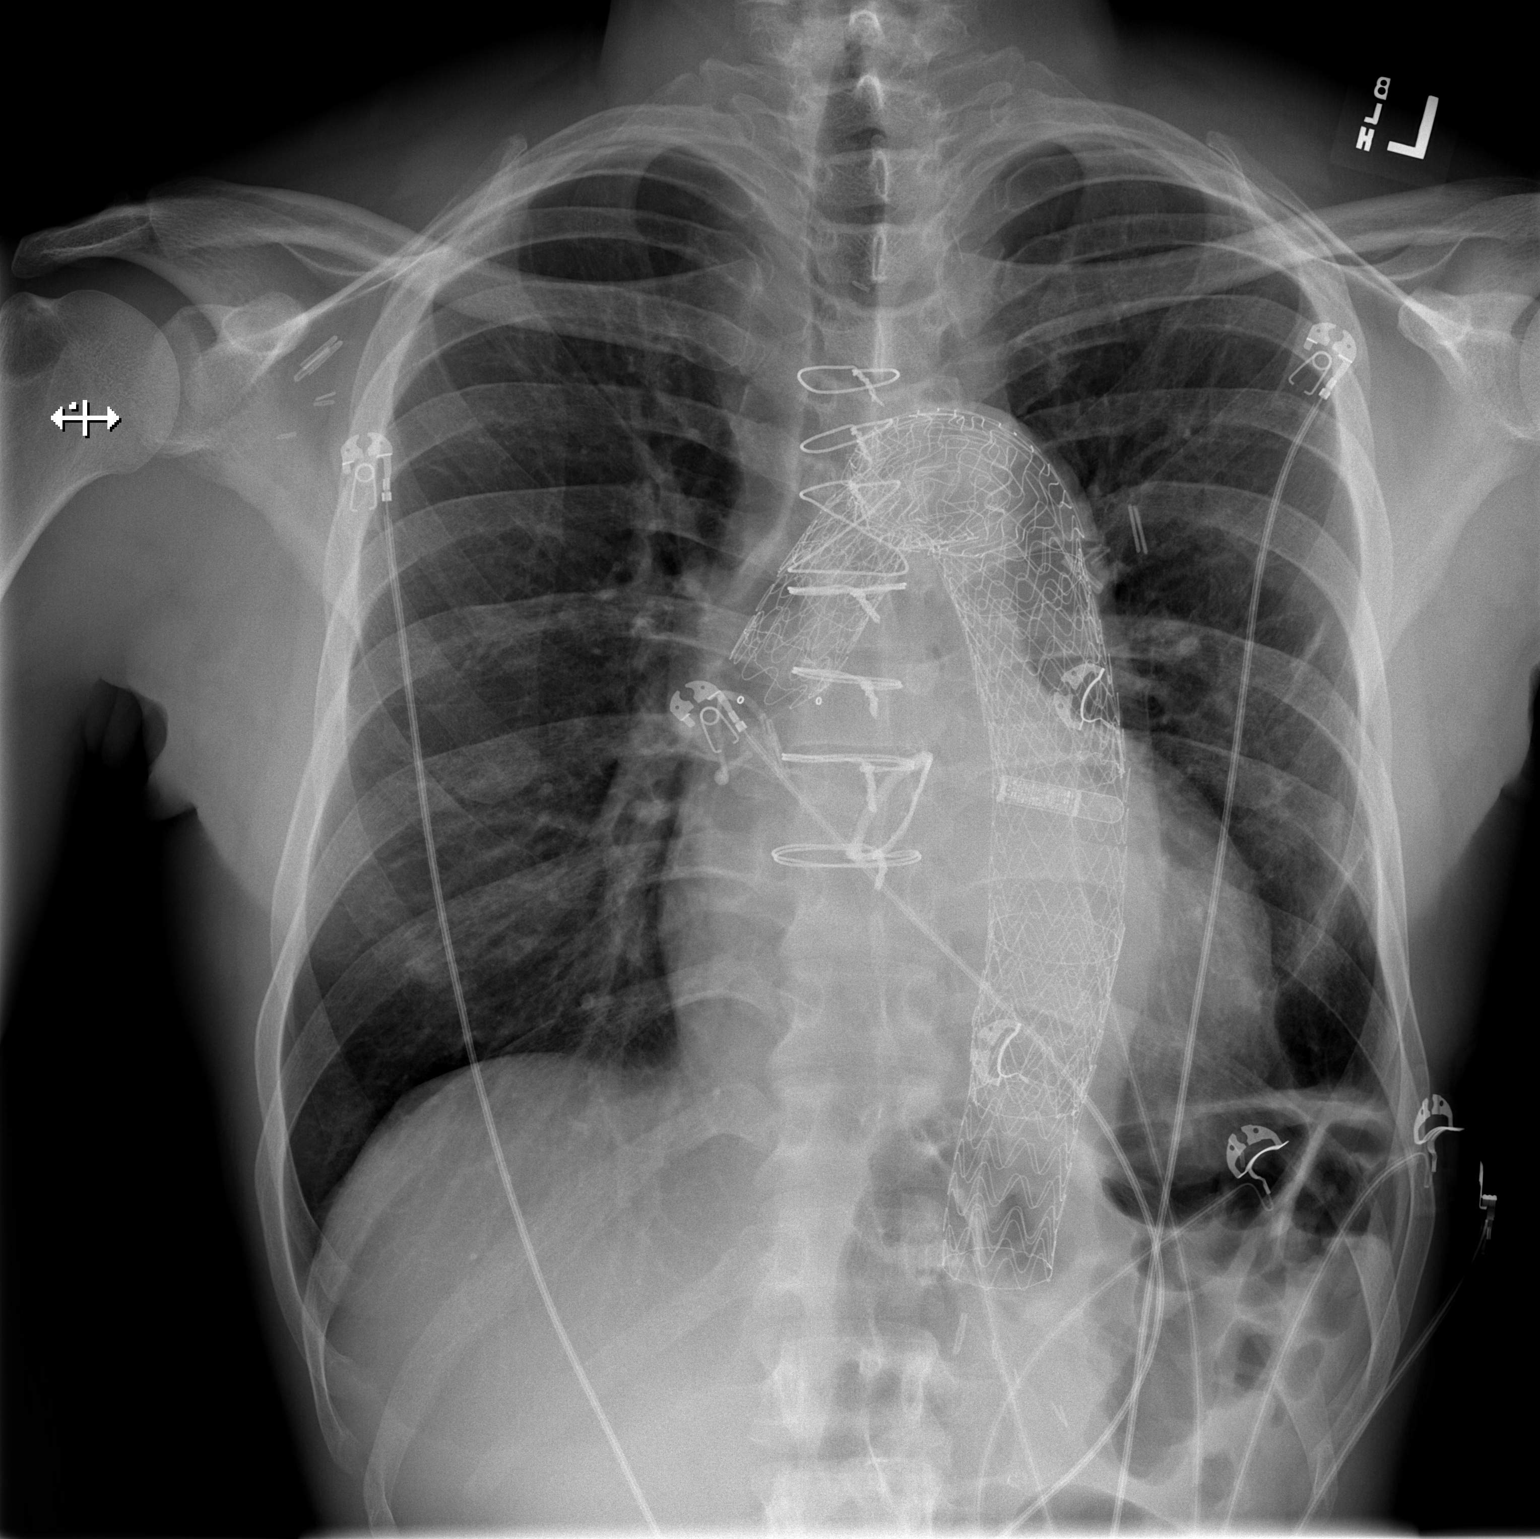

[w chest lat]
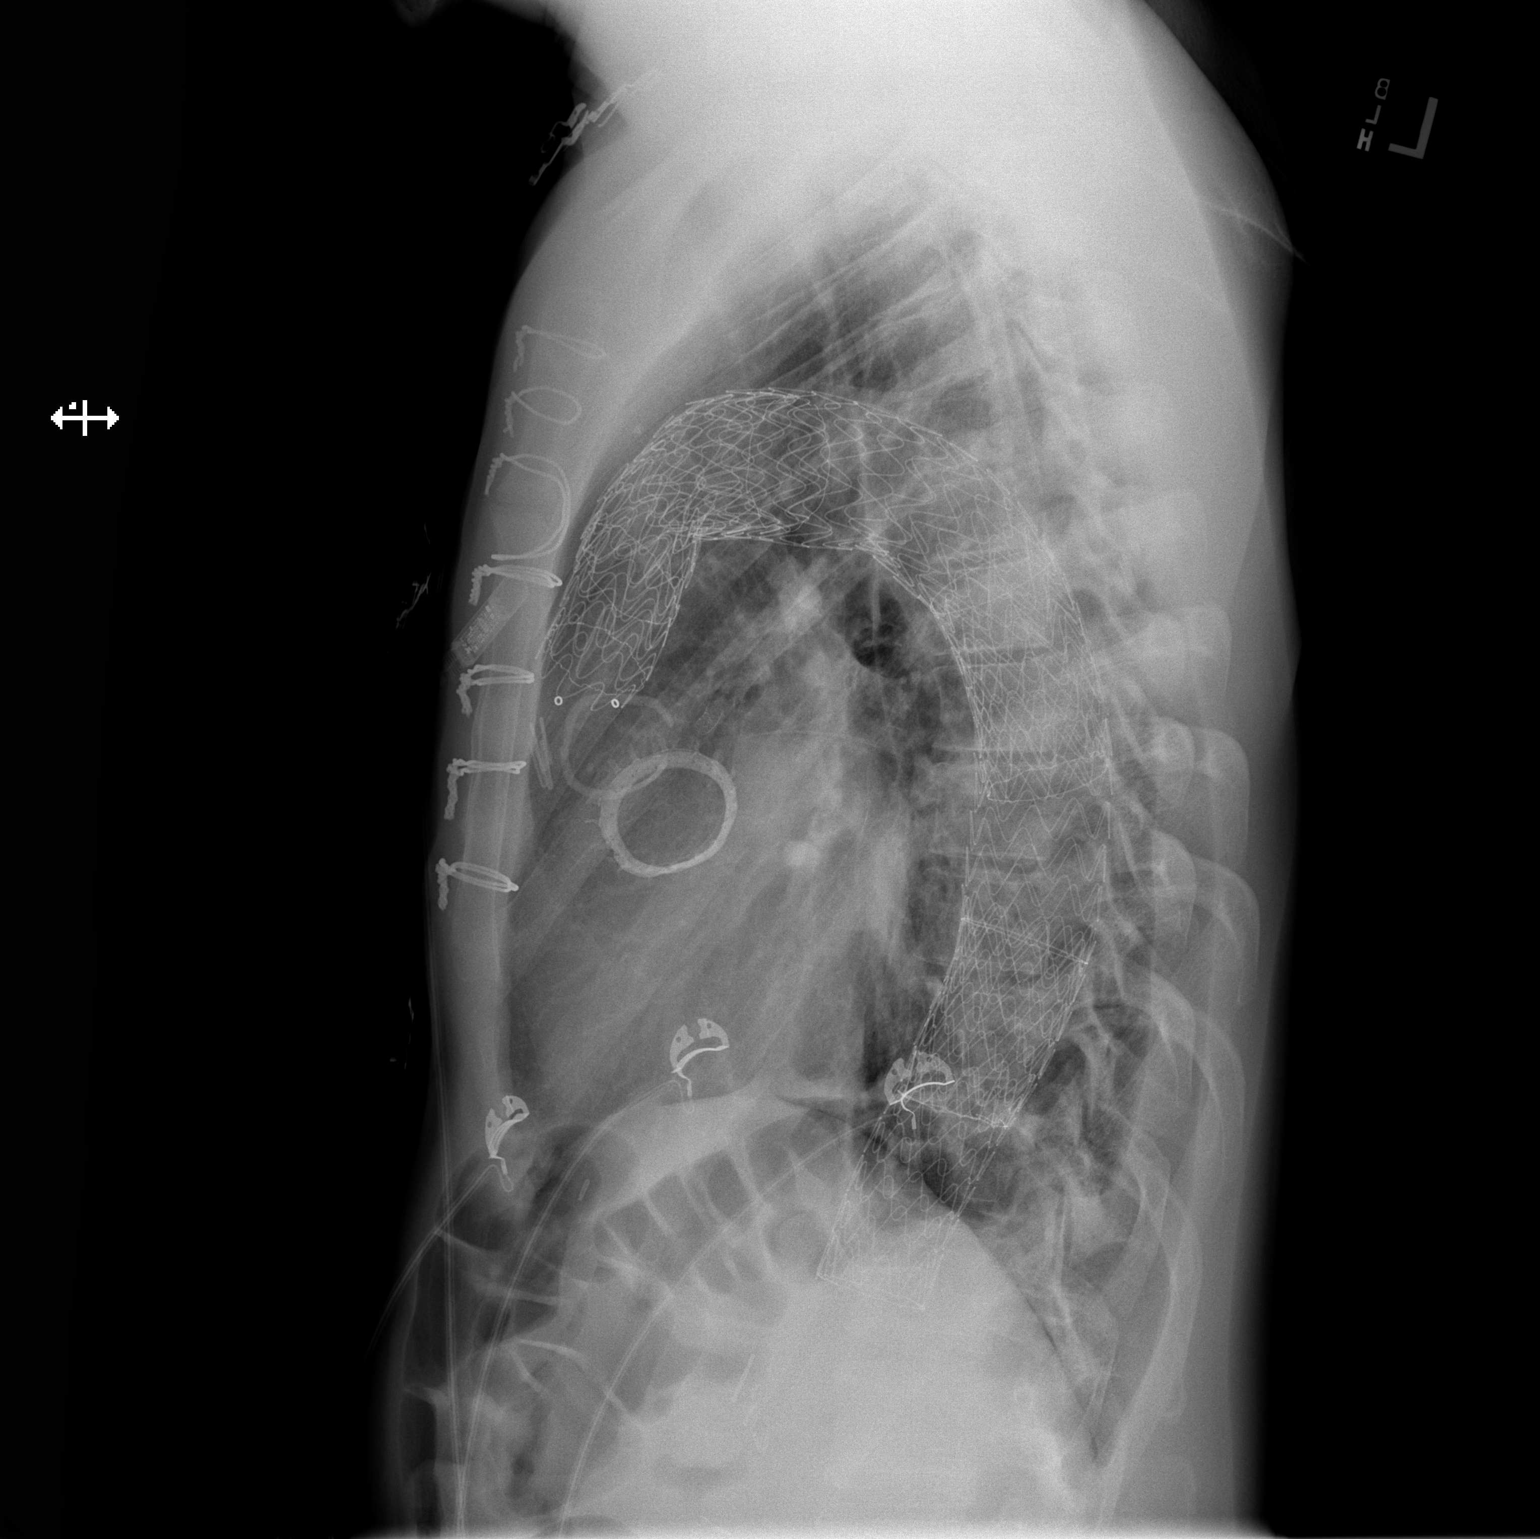

[2 of 2 positions shown; findings below may reference images not displayed]

FINDINGS: Unchanged examination. No acute abnormality of the lungs.
Cardiomegaly status post median sternotomy with surgical aortic
annulus and extensive multi component stent endograft repair of the
aorta.
IMPRESSION: Unchanged examination. No acute abnormality of the lungs.
Cardiomegaly status post median sternotomy with surgical aortic
annulus and extensive multi component stent endograft repair of the
aorta.

## 2021-05-22 IMAGING — CT CT ANGIO CHEST-ABD-PELV FOR DISSECTION W/ AND WO/W CM
2 of 8 series · 12 of 46 positions shown, 14 images · IV contrast (ISOVUE)
Comparison: 12/02/2017

CLINICAL DATA: History of aortic dissection with repair

EXAM:
CT ANGIOGRAPHY CHEST, ABDOMEN AND PELVIS
TECHNIQUE: Multidetector CT imaging through the chest, abdomen and pelvis was
performed using the standard protocol during bolus administration of
intravenous contrast. Multiplanar reconstructed images and MIPs were
obtained and reviewed to evaluate the vascular anatomy.
CONTRAST:  80mL OMNIPAQUE 350

[Series 5: axial arterial · axial · arterial · 0.83mm/px · z∈[+1198,+1732]mm · 9 of 224 slices shown, 11 images]
[im 23/224  soft-tissue]
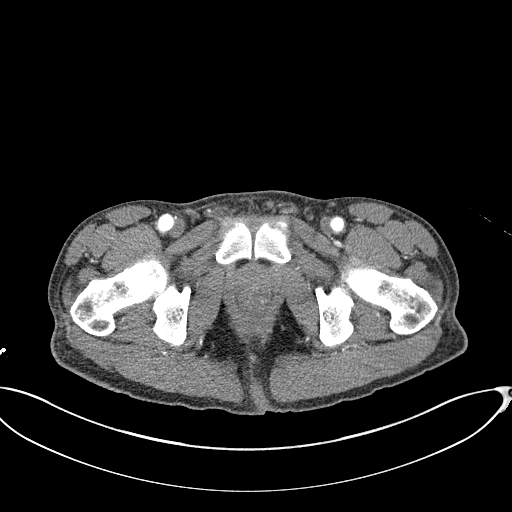
[im 23/224  bone]
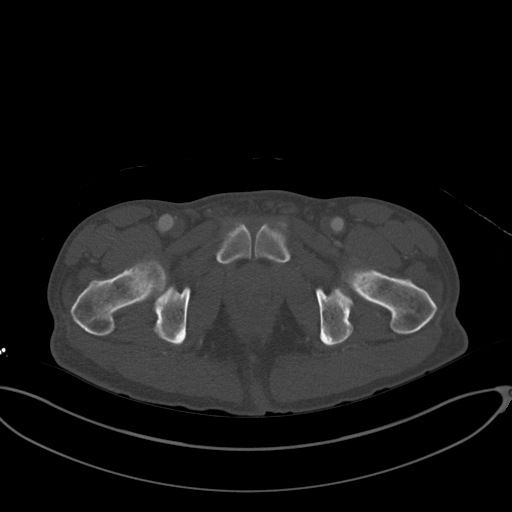
[im 45/224  soft-tissue]
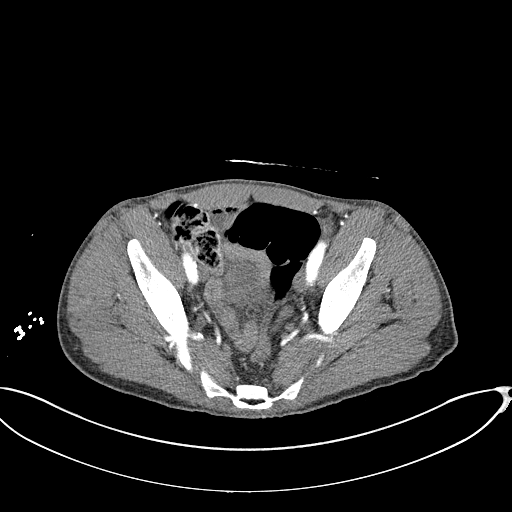
[im 67/224  soft-tissue]
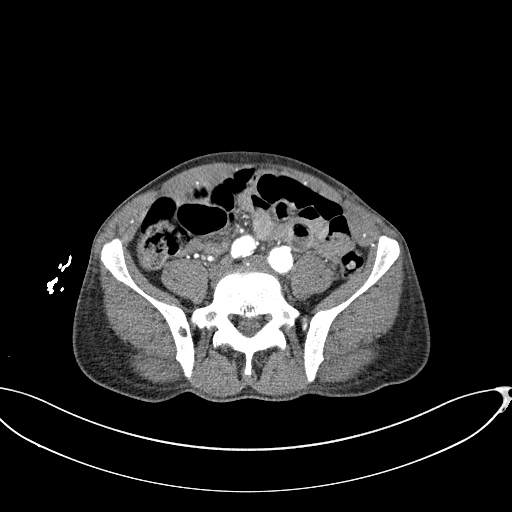
[im 90/224  soft-tissue]
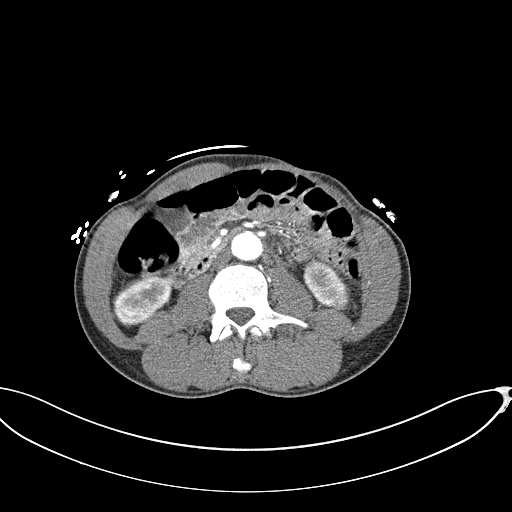
[im 112/224  soft-tissue]
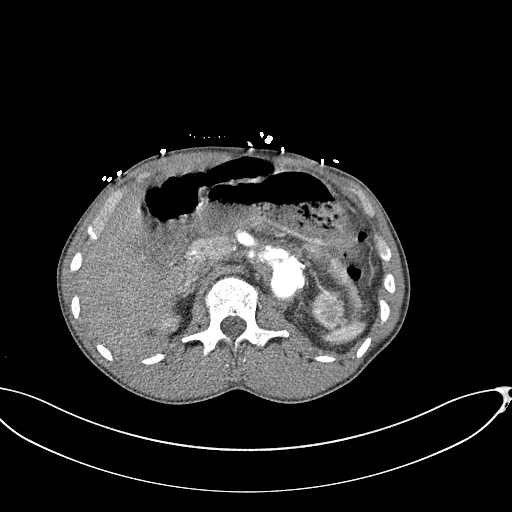
[im 134/224  soft-tissue]
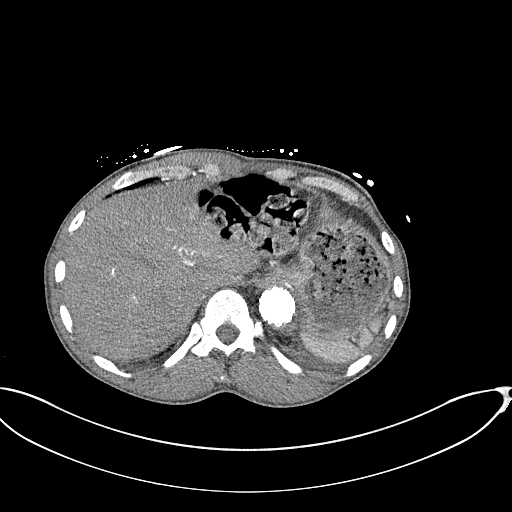
[im 157/224  soft-tissue]
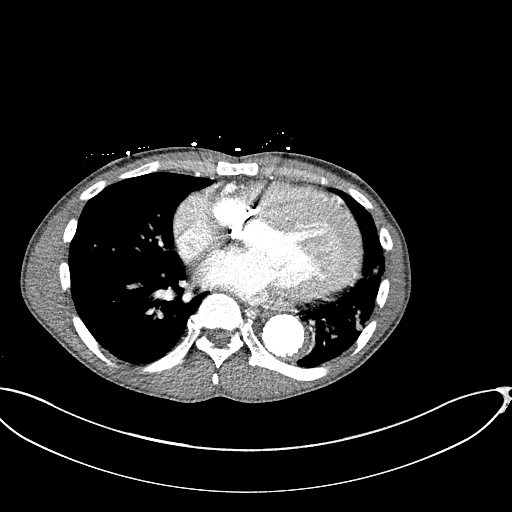
[im 179/224  soft-tissue]
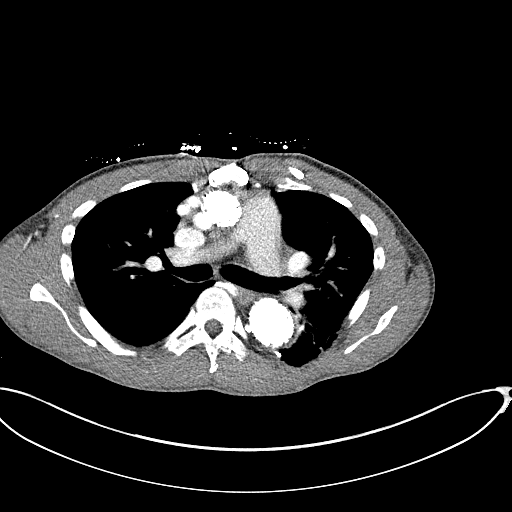
[im 201/224  soft-tissue]
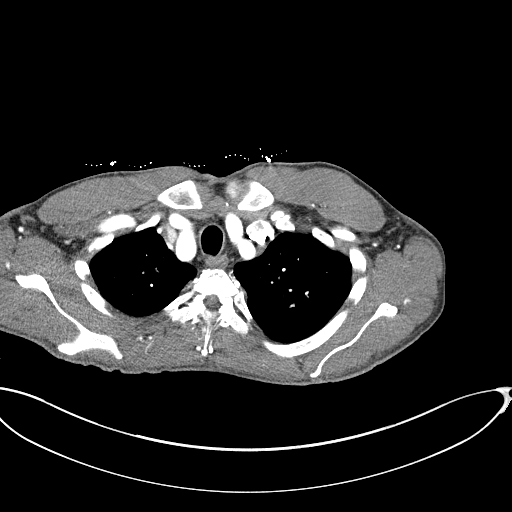
[im 201/224  bone]
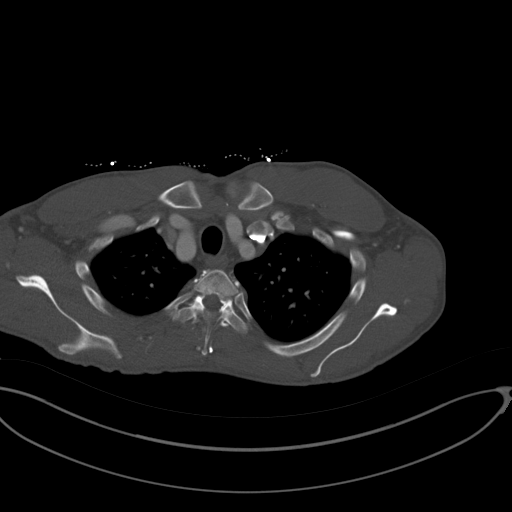

[Series 7: coronals · coronal · 0.83mm/px · 3 of 113 slices shown]
[im 29/113  soft-tissue]
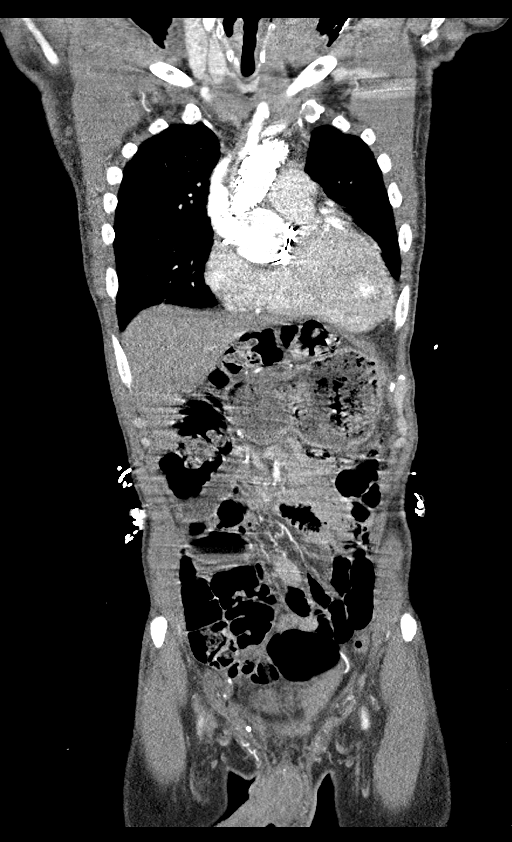
[im 57/113  soft-tissue]
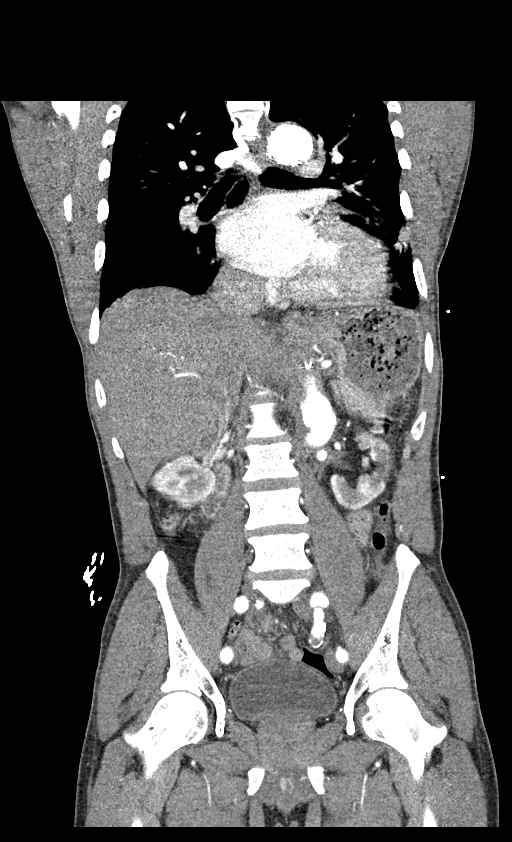
[im 85/113  soft-tissue]
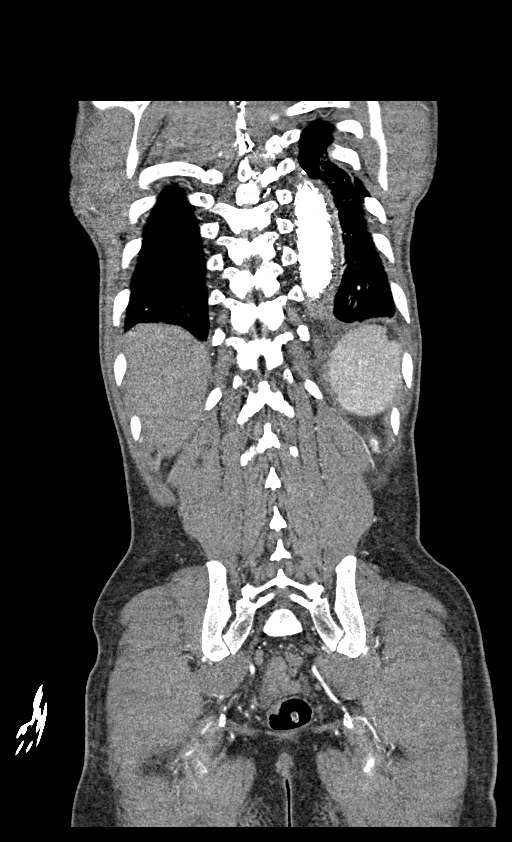

[12 of 46 positions shown; findings below may reference images not displayed]

FINDINGS: CTA CHEST FINDINGS

Cardiovascular: Aortic valve replacement is again identified and
stable in appearance. Aortic root is stable in appearance measuring
approximately 4.2 cm in greatest dimension. The ascending aortic
graft is again identified with a stable appearance with Elephant
graft giving rise to the brachiocephalic vessels. No focal narrowing
is noted. Just beyond the Elephant graft, there is evidence of stent
graft placement extending through the aortic arch and into the
descending thoracic aorta. No endoleak is identified.

The pulmonary artery appears within normal limits although
opacification is limited with regards to pulmonary embolism. No
pericardial effusion is seen. No other focal abnormality is noted.

Mediastinum/Nodes: Thoracic inlet shows no acute abnormality. No
sizable hilar or mediastinal adenopathy is noted. The esophagus as
visualized is within normal limits.

Lungs/Pleura: Lungs are well aerated bilaterally. Right lung shows
no focal infiltrate or sizable effusion. Mild scarring remains at
the left lung base similar to that seen on prior exam. No
parenchymal nodules are seen. No pneumothorax is noted.

Musculoskeletal: Degenerative changes of the thoracic spine are
noted. Postsurgical changes consistent with median sternotomy are
seen. No acute rib abnormality is noted.

Review of the MIP images confirms the above findings.

CTA ABDOMEN AND PELVIS FINDINGS

VASCULAR

Aorta: The known thoracic aortic stent graft extends into the
proximal abdominal aorta. A an implanted graft to the celiac axis is
identified with patency of the branches of the celiac axis.
Additionally graft to the superior mesenteric artery is noted as
well. Tortuous graft to the left renal artery is noted. A more
inline graft to the right renal artery is noted as well. These are
patent.

At the level of the visceral grafts, there is an aneurysmal portion
of the native aorta which again shows thrombosis following prior
dissection. No enhancement is identified. No recurrent dissection is
seen. Calcifications of the more distal aorta are noted.

Celiac: Widely patent from graft arising from the proximal aorta.

SMA: Widely patent from graft arising from the proximal aorta.

Renals: Single renal arteries are identified following grafting to
the proximal aorta.

IMA: Patent without evidence of aneurysm, dissection, vasculitis or
significant stenosis.

Iliacs: Atherosclerotic calcifications of the iliac vessels are
noted. No focal stenosis is seen. Mild aneurysmal dilatation of the
common iliac arteries is noted and stable.

Veins: No venous abnormality is noted.

Review of the MIP images confirms the above findings.

NON-VASCULAR

Hepatobiliary: No focal liver abnormality is seen. No gallstones,
gallbladder wall thickening, or biliary dilatation.

Pancreas: Unremarkable. No pancreatic ductal dilatation or
surrounding inflammatory changes.

Spleen: Normal in size without focal abnormality.

Adrenals/Urinary Tract: Adrenal glands are within normal limits.
Kidneys demonstrate a normal enhancement pattern bilaterally. No
obstructive changes are seen. Mild scarring is noted in the left
kidney stable from the previous exam. The bladder is well distended.

Stomach/Bowel: The appendix is within normal limits. No obstructive
or inflammatory changes of large or small bowel is noted. Stomach is
within normal limits.

Lymphatic: No lymphatic abnormality is noted.

Reproductive: Prostate is unremarkable.

Other: No abdominal wall hernia or abnormality. No abdominopelvic
ascites.

Musculoskeletal: No acute or significant osseous findings.

Review of the MIP images confirms the above findings.
IMPRESSION: Stable postoperative appearance when compared with the prior exam.
Extensive reconstruction of the ascending aorta is seen with aortic
valve and elephant graft supplying the brachiocephalic vessels.
Thoracic stent graft is noted in place without evidence of endoleak.

Extensive postoperative change in the proximal abdominal aorta with
patent C2 all of the visceral grafts identified. The previously seen
aneurysmal portion of the native abdominal aorta is again identified
and thrombosed also stable from the prior exam.

No acute dissection is seen. No areas of focal hemorrhage are
identified.

No acute abnormality within the chest or abdomen is noted.

## 2021-05-27 ENCOUNTER — Emergency Department (HOSPITAL_COMMUNITY): Payer: Medicare Other

## 2021-05-27 ENCOUNTER — Emergency Department (HOSPITAL_COMMUNITY)
Admission: EM | Admit: 2021-05-27 | Discharge: 2021-05-27 | Disposition: A | Discharge status: Home / Self Care | Payer: Medicare Other | Attending: Emergency Medicine | Admitting: Emergency Medicine

## 2021-05-27 ENCOUNTER — Other Ambulatory Visit: Payer: Self-pay

## 2021-05-27 DIAGNOSIS — K8020 Calculus of gallbladder without cholecystitis without obstruction: Secondary | ICD-10-CM | POA: Insufficient documentation

## 2021-05-27 DIAGNOSIS — Z7982 Long term (current) use of aspirin: Secondary | ICD-10-CM | POA: Diagnosis not present

## 2021-05-27 DIAGNOSIS — F17200 Nicotine dependence, unspecified, uncomplicated: Secondary | ICD-10-CM | POA: Diagnosis not present

## 2021-05-27 DIAGNOSIS — R0689 Other abnormalities of breathing: Secondary | ICD-10-CM | POA: Diagnosis not present

## 2021-05-27 DIAGNOSIS — I129 Hypertensive chronic kidney disease with stage 1 through stage 4 chronic kidney disease, or unspecified chronic kidney disease: Secondary | ICD-10-CM | POA: Insufficient documentation

## 2021-05-27 DIAGNOSIS — Z79899 Other long term (current) drug therapy: Secondary | ICD-10-CM | POA: Diagnosis not present

## 2021-05-27 DIAGNOSIS — I2510 Atherosclerotic heart disease of native coronary artery without angina pectoris: Secondary | ICD-10-CM | POA: Diagnosis not present

## 2021-05-27 DIAGNOSIS — N183 Chronic kidney disease, stage 3 unspecified: Secondary | ICD-10-CM | POA: Diagnosis not present

## 2021-05-27 DIAGNOSIS — R079 Chest pain, unspecified: Secondary | ICD-10-CM

## 2021-05-27 LAB — TROPONIN I (HIGH SENSITIVITY)
Troponin I (High Sensitivity): 26 ng/L — ABNORMAL HIGH (ref <18)
Troponin I (High Sensitivity): 25 ng/L — ABNORMAL HIGH (ref <18)

## 2021-05-27 LAB — CBC
HCT: 29.1 % — ABNORMAL LOW (ref 39.0–52.0)
Hemoglobin: 9.4 g/dL — ABNORMAL LOW (ref 13.0–17.0)
MCH: 32.2 pg (ref 26.0–34.0)
MCHC: 32.3 g/dL (ref 30.0–36.0)
MCV: 99.7 fL (ref 80.0–100.0)
Platelets: 233 10*3/uL (ref 150–400)
RBC: 2.92 MIL/uL — ABNORMAL LOW (ref 4.22–5.81)
RDW: 14.1 % (ref 11.5–15.5)
WBC: 4.4 10*3/uL (ref 4.0–10.5)
nRBC: 0 % (ref 0.0–0.2)

## 2021-05-27 LAB — COMPREHENSIVE METABOLIC PANEL
ALT: 10 U/L (ref 0–44)
AST: 16 U/L (ref 15–41)
Albumin: 3.5 g/dL (ref 3.5–5.0)
Alkaline Phosphatase: 52 U/L (ref 38–126)
Anion gap: 10 (ref 5–15)
BUN: 32 mg/dL — ABNORMAL HIGH (ref 6–20)
CO2: 26 mmol/L (ref 22–32)
Calcium: 8.9 mg/dL (ref 8.9–10.3)
Chloride: 103 mmol/L (ref 98–111)
Creatinine, Ser: 7.75 mg/dL — ABNORMAL HIGH (ref 0.61–1.24)
GFR, Estimated: 8 mL/min — ABNORMAL LOW (ref >60)
Glucose, Bld: 85 mg/dL (ref 70–99)
Potassium: 4 mmol/L (ref 3.5–5.1)
Sodium: 139 mmol/L (ref 135–145)
Total Bilirubin: 0.7 mg/dL (ref 0.3–1.2)
Total Protein: 6.5 g/dL (ref 6.5–8.1)

## 2021-05-27 LAB — SEDIMENTATION RATE: Sed Rate: 14 mm/hr (ref 0–16)

## 2021-05-27 LAB — CBG MONITORING, ED: Glucose-Capillary: 79 mg/dL (ref 70–99)

## 2021-05-27 LAB — LACTIC ACID, PLASMA: Lactic Acid, Venous: 0.8 mmol/L (ref 0.5–1.9)

## 2021-05-27 LAB — BRAIN NATRIURETIC PEPTIDE: B Natriuretic Peptide: 395.4 pg/mL — ABNORMAL HIGH (ref 0.0–100.0)

## 2021-05-27 LAB — LIPASE, BLOOD: Lipase: 35 U/L (ref 11–51)

## 2021-05-27 MED ORDER — MORPHINE SULFATE (PF) 4 MG/ML IV SOLN
4.0000 mg | Freq: Once | INTRAVENOUS | Status: AC
  Administered 2021-05-27: 4 mg via INTRAVENOUS
  Filled 2021-05-27: qty 1

## 2021-05-27 MED ORDER — ASPIRIN 81 MG PO CHEW
324.0000 mg | CHEWABLE_TABLET | Freq: Once | ORAL | Status: AC
  Administered 2021-05-27: 324 mg via ORAL
  Filled 2021-05-27: qty 4

## 2021-05-27 MED ORDER — IOHEXOL 350 MG/ML SOLN
100.0000 mL | Freq: Once | INTRAVENOUS | Status: AC | PRN
  Administered 2021-05-27: 100 mL via INTRAVENOUS

## 2021-05-27 NOTE — Discharge Instructions (Addendum)
Your US revealed gallstones without evidence of infection of your gallbladder. If your develop severe right upper quadrant pain and nausea and vomiting, return to the ED for repeat assessment, otherwise follow-up outpatient with a general surgeon in addition to your cardiologist. Your CTA did not reveal any acute aorta abnormality. You have no signs of acute infection of your heart today without fever or elevation in your WBC. Return to the ED for continued chest pain and fever.  ?

## 2021-05-27 NOTE — ED Triage Notes (Signed)
Pt c/o increase c/p that radiates to back and down the left the arm.  ? ?HX: aorta replacement ?

## 2021-05-27 NOTE — ED Notes (Signed)
US at bedside

## 2021-05-27 NOTE — ED Notes (Signed)
Pt verbalizes understanding of discharge instructions. Opportunity for questions and answers were provided. Pt discharged from the ED.   ?

## 2021-05-27 NOTE — ED Notes (Signed)
Pt refusing rectal temp  

## 2021-05-27 NOTE — ED Provider Notes (Addendum)
?[facility] ?Provider Note ? ? ?CSN: [id number] ?Arrival date & time: 05/27/21  0930 ? ?  ? ?History ? ?Chief Complaint  ?Patient presents with  ? Chest Pain  ? ? ?[name] is a 52 y.o. male. ? 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 ? ?The patient endorses an increase in his chest pain that radiates to his back and down his left arm.  Symptoms came on over the last few days.  No cough or shortness of breath.  No fever.  No neurologic deficits.  The patient does state that he underwent dental extraction last week but was administered antibiotics prior to the procedure. ? ?Home Medications ?Prior to Admission medications   ?Medication Sig Start Date End Date Taking? Authorizing Provider  ?acetaminophen (TYLENOL) 325 MG tablet Take 650 mg by mouth every 6 (six) hours as needed for moderate pain (for pain or headaches).    [provider]  ?amitriptyline (ELAVIL) 50 MG tablet Take 50 mg by mouth at bedtime.    [provider]  ?amLODipine (NORVASC) 5 MG tablet Take 1 tablet (5 mg total) by mouth daily. 05/29/21   Larey Dresser, MD  ?aspirin EC 81 MG EC tablet Take 1 tablet (81 mg total) by mouth daily. 12/12/17   Ledora Bottcher, PA  ?atorvastatin (LIPITOR) 80 MG tablet Take 80 mg by mouth every other day.    [provider]  ?b complex-vitamin c-folic acid (NEPHRO-VITE) 0.8 MG TABS tablet Take 1 tablet by mouth Every Tuesday,Thursday,and Saturday with dialysis.    [provider]  ?Blood Pressure Monitor KIT 1 Device by Does not apply route 2 (two) times daily. 11/29/18   Kerin Perna, NP  ?carvedilol (COREG) 25 MG tablet Take 25 mg by mouth in the  morning and at bedtime.    [provider]  ?Evolocumab (REPATHA SURECLICK) 712 MG/ML SOAJ Inject 1 Dose into the skin every 14 (fourteen) days. 03/11/21   Hilty, Nadean Corwin, MD  ?hydrALAZINE (APRESOLINE) 50 MG tablet Take 1.5 tablets (75 mg total) by mouth 3 (three) times daily. 10/02/20 11/25/21  Rafael Bihari, FNP  ?lidocaine-prilocaine (EMLA) cream Apply 1 application. topically as needed (prior to port being accessed).    [provider]  ?Methoxy PEG-Epoetin Beta (MIRCERA IJ) Mircera 01/14/21 01/13/22  [provider]  ?Tiotropium Bromide-Olodaterol (STIOLTO RESPIMAT) 2.5-2.5 MCG/ACT AERS Inhale 2 puffs into the lungs daily. 02/28/21   Rafael Bihari, FNP  ?VELPHORO 500 MG chewable tablet Chew 500 mg by mouth 3 (three) times daily with meals. 01/30/21   [provider]  ?   ? ?Allergies    ?Imdur [isosorbide nitrate] and Tramadol   ? ?Review of Systems   ?Review of Systems  ?All other systems reviewed and are negative. ? ?Physical Exam ?Updated Vital Signs ?BP (!) 153/85   Pulse 69   Temp 98.4 ?F (36.9 ?C) (Oral)   Resp 20   SpO2 98%  ?Physical Exam ?Vitals and nursing note reviewed.  ?Constitutional:   ?   General: He is not in acute distress. ?   Appearance: He is well-developed.  ?HENT:  ?   Head: Normocephalic and atraumatic.  ?Eyes:  ?  Conjunctiva/sclera: Conjunctivae normal.  ?Cardiovascular:  ?   Rate and Rhythm: Normal rate and regular rhythm.  ?   Heart sounds: No murmur heard. ?Pulmonary:  ?   Effort: Pulmonary effort is normal. No respiratory distress.  ?   Breath sounds: Normal breath sounds.  ?Abdominal:  ?   Palpations: Abdomen is soft.  ?   Tenderness: There is abdominal tenderness in the right upper quadrant.  ?Musculoskeletal:     ?   General: No swelling.  ?   Cervical back: Neck supple.  ?Skin: ?   General: Skin is warm and dry.  ?   Capillary Refill: Capillary refill takes less than 2 seconds.  ?Neurological:  ?   Mental Status: He is alert.   ?Psychiatric:     ?   Mood and Affect: Mood normal.  ? ? ?ED Results / Procedures / Treatments   ?Labs ?(all labs ordered are listed, but only abnormal results are displayed) ?Labs Reviewed  ?CBC - Abnormal; Notable for the following components:  ?    Result Value  ? RBC 2.92 (*)   ? Hemoglobin 9.4 (*)   ? HCT 29.1 (*)   ? All other components within normal limits  ?COMPREHENSIVE METABOLIC PANEL - Abnormal; Notable for the following components:  ? BUN 32 (*)   ? Creatinine, Ser 7.75 (*)   ? GFR, Estimated 8 (*)   ? All other components within normal limits  ?BRAIN NATRIURETIC PEPTIDE - Abnormal; Notable for the following components:  ? B Natriuretic Peptide 395.4 (*)   ? All other components within normal limits  ?TROPONIN I (HIGH SENSITIVITY) - Abnormal; Notable for the following components:  ? Troponin I (High Sensitivity) 26 (*)   ? All other components within normal limits  ?TROPONIN I (HIGH SENSITIVITY) - Abnormal; Notable for the following components:  ? Troponin I (High Sensitivity) 25 (*)   ? All other components within normal limits  ?CULTURE, BLOOD (ROUTINE X 2)  ?CULTURE, BLOOD (ROUTINE X 2)  ?CULTURE, BLOOD (SINGLE)  ?LIPASE, BLOOD  ?SEDIMENTATION RATE  ?LACTIC ACID, PLASMA  ?CBG MONITORING, ED  ? ? ?EKG ?EKG Interpretation ? ?Date/Time:  Tuesday May 27 2021 09:43:39 EDT ?Ventricular Rate:  82 ?PR Interval:  161 ?QRS Duration: 103 ?QT Interval:  381 ?QTC Calculation: 445 ?R Axis:   -16 ?Text Interpretation: Sinus rhythm Left atrial enlargement LVH with secondary repolarization abnormality No significant change since last tracing Confirmed by Regan Lemming ([id number]) on 05/27/2021 9:46:56 AM ? ?Radiology ?US Abdomen Limited RUQ (LIVER/GB) ? ?Result Date: 05/27/2021 ?CLINICAL DATA:  Gallstone. EXAM: ULTRASOUND ABDOMEN LIMITED RIGHT UPPER QUADRANT COMPARISON:  CTA chest abdomen and pelvis of earlier today. FINDINGS: Gallbladder: 9 mm gallstone. No wall thickening or pericholecystic fluid. Sonographic Murphy's  sign was not elicited. Common bile duct: Diameter: Normal, 5 mm. Liver: Slightly increased hepatic echogenicity. High right hepatic lobe 9 mm cyst. Portal vein is patent on color Doppler imaging with normal direction of blood flow towards the liver. Other: None. IMPRESSION: Cholelithiasis without acute cholecystitis or biliary duct dilatation. Probable mild hepatic steatosis. Electronically Signed   By: Abigail Miyamoto M.D.   On: 05/27/2021 15:10   ? ?Procedures ?Procedures  ? ? ?Medications Ordered in ED ?Medications  ?aspirin chewable tablet 324 mg (324 mg Oral Given 05/27/21 1106)  ?morphine (PF) 4 MG/ML injection 4 mg (4 mg Intravenous Given 05/27/21 1104)  ?iohexol (OMNIPAQUE) 350 MG/ML injection 100 mL (100 mLs Intravenous Contrast Given 05/27/21 1309)  ? ? ?  ED Course/ Medical Decision Making/ A&P ?  ?                        ?Medical Decision Making ?Amount and/or Complexity of Data Reviewed ?Labs: ordered. ?Radiology: ordered. ? ?Risk ?OTC drugs. ?Prescription drug management. ? ? 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 ? ?The patient endorses an increase in his chest pain that radiates to his back and down his left arm.  Symptoms came on over the last few days.  No cough or shortness of breath.  No fever.  No neurologic deficits.  The patient does state that he underwent dental extraction last week but was administered antibiotics prior to the procedure. ? ?On arrival, the patient was afebrile, hemodynamically stable, mildly hypertensive BP 169/92, Sinus rhythm noted on cardiac telemetry. ? ?Initial physical exam generally unremarkable with exception of mild right upper quadrant tenderness to palpation.  Differential diagnosis includes aortic  dissection, ACS, PE, pneumonia, pneumothorax, musculoskeletal pain, peptic ulcer disease, GERD, cholelithiasis/cholecystitis.  Lower suspicion for endocarditis at this time.   ? ?The patient is currently afebrile,

## 2021-05-29 ENCOUNTER — Encounter (HOSPITAL_COMMUNITY): Payer: Self-pay | Admitting: Cardiology

## 2021-05-29 ENCOUNTER — Ambulatory Visit (HOSPITAL_COMMUNITY)
Admission: RE | Admit: 2021-05-29 | Discharge: 2021-05-29 | Disposition: A | Discharge status: Home / Self Care | Payer: Medicare Other | Source: Ambulatory Visit | Attending: Cardiology | Admitting: Cardiology

## 2021-05-29 ENCOUNTER — Other Ambulatory Visit: Payer: Self-pay

## 2021-05-29 VITALS — BP 180/90 | HR 89 | Wt 155.4 lb

## 2021-05-29 DIAGNOSIS — I2510 Atherosclerotic heart disease of native coronary artery without angina pectoris: Secondary | ICD-10-CM | POA: Diagnosis not present

## 2021-05-29 DIAGNOSIS — Z953 Presence of xenogenic heart valve: Secondary | ICD-10-CM | POA: Diagnosis not present

## 2021-05-29 DIAGNOSIS — I5042 Chronic combined systolic (congestive) and diastolic (congestive) heart failure: Secondary | ICD-10-CM | POA: Diagnosis present

## 2021-05-29 DIAGNOSIS — Z09 Encounter for follow-up examination after completed treatment for conditions other than malignant neoplasm: Secondary | ICD-10-CM | POA: Diagnosis not present

## 2021-05-29 DIAGNOSIS — I208 Other forms of angina pectoris: Secondary | ICD-10-CM

## 2021-05-29 DIAGNOSIS — Z8673 Personal history of transient ischemic attack (TIA), and cerebral infarction without residual deficits: Secondary | ICD-10-CM | POA: Insufficient documentation

## 2021-05-29 DIAGNOSIS — I252 Old myocardial infarction: Secondary | ICD-10-CM | POA: Insufficient documentation

## 2021-05-29 DIAGNOSIS — I132 Hypertensive heart and chronic kidney disease with heart failure and with stage 5 chronic kidney disease, or end stage renal disease: Secondary | ICD-10-CM | POA: Diagnosis not present

## 2021-05-29 DIAGNOSIS — Z7982 Long term (current) use of aspirin: Secondary | ICD-10-CM | POA: Diagnosis not present

## 2021-05-29 DIAGNOSIS — Z79899 Other long term (current) drug therapy: Secondary | ICD-10-CM | POA: Insufficient documentation

## 2021-05-29 DIAGNOSIS — I25119 Atherosclerotic heart disease of native coronary artery with unspecified angina pectoris: Secondary | ICD-10-CM | POA: Diagnosis not present

## 2021-05-29 DIAGNOSIS — I5022 Chronic systolic (congestive) heart failure: Secondary | ICD-10-CM

## 2021-05-29 DIAGNOSIS — K8020 Calculus of gallbladder without cholecystitis without obstruction: Secondary | ICD-10-CM | POA: Insufficient documentation

## 2021-05-29 DIAGNOSIS — N186 End stage renal disease: Secondary | ICD-10-CM | POA: Insufficient documentation

## 2021-05-29 DIAGNOSIS — F17210 Nicotine dependence, cigarettes, uncomplicated: Secondary | ICD-10-CM | POA: Insufficient documentation

## 2021-05-29 DIAGNOSIS — Z992 Dependence on renal dialysis: Secondary | ICD-10-CM | POA: Insufficient documentation

## 2021-05-29 LAB — LIPID PANEL
Cholesterol: 111 mg/dL (ref 0–200)
HDL: 46 mg/dL (ref >40)
LDL Cholesterol: 50 mg/dL (ref 0–99)
Total CHOL/HDL Ratio: 2.4 RATIO
Triglycerides: 74 mg/dL (ref <150)
VLDL: 15 mg/dL (ref 0–40)

## 2021-05-29 MED ORDER — AMLODIPINE BESYLATE 5 MG PO TABS: 5.0000 mg | ORAL_TABLET | Freq: Every day | ORAL | 3 refills | Status: DC

## 2021-05-29 NOTE — Progress Notes (Signed)
?  ? ? ?Date:  05/29/2021  ? ?ID:  [name], DOB [birth year], MRN [id number]   ?Provider location: [place] [facility] ?Type of Visit: Established patient  ? ?PCP:  Arthur Holms, NP  ?Cardiologist:  Dr. Aundra Dubin ?  ?History of Present Illness: ?[name] is a 51 y.o. 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  ?  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  The descending thoracic aorta has dilated to 5.1 cm.  Cardiac catheterization in April 2016 demonstrated 60% first diagonal lesion.  He underwent aortic valve replacement with bovine pericardial tissue valve and reconstruction along with aortic root replacement and endovascular repair of the descending thoracic aortic aneurysm at [facility].  He has had intermittent chest discomfort since then. ?  ?Admitted in 12/02/2017 chest pain. Had NSTEMI. He had LHC with stent placed proximal ramus.  Repeat cath in 7/20 with nonobstructive disease.  ? ?He was admitted in 9/21 with atypical chest pain.  Echo showed EF 60-65% with severe LVH, normal RV, bioprosthetic aortic valve with mean gradient 18 mmHg.  Cardiolite showed no ischemia.  ? ?Echoes in 3/22 and 6/22 showed hyperdynamic LV with aortic valve mean gradient > 30. Therefore, TEE was done in 8/22 to assess the bioprosthetic aortic valve.  This showed EF 60-65%, severe LVH, normal RV, bioprosthetic AoV that opened with minimal restriction, mean gradient 20 mmHg in setting of anesthesia and BP control, no AI.  Suspect small/undersized aortic valve.  ? ?Patient now has ESRD and is on dialysis.  ? ?[place] 11/22 showed small high 1st diagonal with diffuse severe  disease, similar to prior. Otherwise, nonobstructive disease. ? ?Today he returns for HF follow up of CAD and HTN. He still has chest pain chronically.  It is left-sided and gets worse with exertion.  He says that this has not changed for years since his aortic surgery.  He was seen recently at [facility] and had a CTA chest showing stable repaired thoracic aorta.  No significant exertional dyspnea though he fatigues easily.  No orthopnea/PND. Weight stable. He has had abdominal pain and has been found to have symptomatic cholelithiasis. BP is high today and runs high when he checks at home. He has been off amlodipine and is not sure why. He is still smoking about 4 cigarettes/day.  ? ?ECG (personally reviewed): NSR, LVH with repolarization abnormality.    ?  ?Labs (10/19): K 4.1, creatinine 1.78, myeloma panel negative ?Labs (2/20): K 4.2, creatinine 1.75, LDL 128 ?Labs (7/21): LDL 66 ?Labs (9/21): K 4.1, creatinine 3.31, hgb 11.9 ?Labs (10/21): K 4, creatinine 2.96, hgb 12.8 ?Labs (3/22): LDL 55 ?Labs (10/22): hgb 10.6 ?Labs (11/22): K 3.8, hgb 10.6 ?Labs (3/23): BNP 395, hgb 9.4 ?  ?Review of Systems: All systems reviewed and negative except as per HPI.  ?  ?PMH: ?1. H/o ankle fractures bilaterally ?2. GERD ?3. HTN: Negative urinary catecholeamine collection for pheochromocytoma.  ?- Renal artery dopplers (3/22): Not suggestive of significant renal artery stenosis.  ?4. ESRD: Suspect hypertensive nephropathy.  ?5. Cardiomyopathy: Most likely due to heavy ETOH ingestion and uncontrolled HTN.   Echo (3/13)  with EF 35-40%, diffuse HK, mild LVH, moderate to severe eccentric MR, mild to moderate AI with left coronary cusp prolapse, PA systolic pressure 38 mmHg.  Lexiscan myoview (4/13): EF 37%, global hypokinesis, no ischemic or infarction.  Echo (12/14) with EF 45-50%, diffuse hypokinesis, moderate LVH, mild MR, moderate AI.  Echo (4/16) with EF 55-60%, severe LVH, mild LV dilation, moderate AI and moderate MR.   ?- Echo  (9/19): EF 35-40% with diffuse hypokinesis, severe LVH, bioprosthetic aortic valve functioning normally.  ?- PYP scan (1/20): No evidence for TTR amyloidosis.  ?- Echo (9/21): EF 60-65%, severe LVH, normal RV, bioprosthetic aortic valve with mean gradient 18 mmHg.  ?- TEE (8/22): EF 60-65%, severe LVH, normal RV, bioprosthetic AoV that opened with minimal restriction, mean gradient 20 mmHg in setting of anesthesia and BP control, no AI.  Suspect small/undersized aortic valve. ?6. ETOH abuse: Quit 3/13.  ?7. Active smoker. ?8. Valvular heart disease: Echo in 3/13 showed moderate to severe eccentric MR and mild to moderate AI with prolapsing left coronary cusp. Echo 12/14 with mild MR and moderate AI.  Echo 4/16 with moderate AI and moderate MR.  ?- S/p Bentall procedure with bioprosthetic aortic valve 9/16.  ?- Echoes in 3/22 and 6/22 showed hyperdynamic LV with bioprosthetic aortic valve with mean gradient > 30.  ?- TEE (8/22): EF 60-65%, severe LVH, normal RV, bioprosthetic AoV that opened with minimal restriction, mean gradient 20 mmHg in setting of anesthesia and BP control, no AI.  Suspect small/undersized aortic valve. ?9.  CVA: Has ILR. ?10. Type B aortic dissection: Occurred in 2/16, from just distal to the left subclavian.  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    ?- 10/03/2014 First stage median sternotomy with right axillary cannulation for aortic valved conduit root replacement with coronary reconstruction (button Bentall Procedure), 27 mm Sorin Mitroflow bovine pericardial valved conduit, total arch replacement (81m Vascutek BSomaliamultibranch arch graft with individual head vessel reimplantation. ?- Status post  open repair of an Extent III TAA secondary to chronic dissection with a # 221mVascutek "Coselli" multibranch Dacron graft with individual celiac, SMA, and bilateral renal artery implantation on 01/04/2017, [facility].  ?11. CAD: LHC (4/16) with 60% ostial D1.   ?- NSTEMI  9/19: LHC (9/19) showed 50% proximal LCx, 90% ramus => DES to ramus.  ?- LHC (7/20): D1 60% stenosis, patent ramus stent.  ?- Cardiolite (9/21): Inferior fixed defect, no ischemia.  ?- LHC (11/22): D1 95% stenosis, patient ramus stent. No interventional targets. ?12. Sleep study (10/21) with no significant OSA.  ? ?Current Outpatient Medications  ?Medication Sig Dispense Refill  ? acetaminophen (TYLENOL) 325 MG tablet Take 650 mg by mouth every 6 (six) hours as needed for moderate pain (for pain or headaches).    ? amitriptyline (ELAVIL) 50 MG tablet Take 50 mg by mouth at bedtime.    ? amLODipine (NORVASC) 5 MG tablet Take 1 tablet (5 mg total) by mouth daily. 180 tablet 3  ? aspirin EC 81 MG EC tablet Take 1 tablet (81 mg total) by mouth daily. 90 tablet 3  ? atorvastatin (LIPITOR) 80 MG tablet Take 80 mg by mouth every other day.    ? b complex-vitamin c-folic acid (NEPHRO-VITE) 0.8 MG TABS tablet Take 1 tablet by mouth Every Tuesday,Thursday,and Saturday with dialysis.    ? Blood Pressure Monitor KIT 1 Device by Does not apply route 2 (two) times daily. 1 kit 0  ? carvedilol (COREG) 25 MG  tablet Take 25 mg by mouth in the morning and at bedtime.    ? Evolocumab (REPATHA SURECLICK) 027 MG/ML SOAJ Inject 1 Dose into the skin every 14 (fourteen) days. 2 mL 11  ? hydrALAZINE (APRESOLINE) 50 MG tablet Take 1.5 tablets (75 mg total) by mouth 3 (three) times daily. 135 tablet 1  ? lidocaine-prilocaine (EMLA) cream Apply 1 application. topically as needed (prior to port being accessed).    ? Methoxy PEG-Epoetin Beta (MIRCERA IJ) Mircera    ? Tiotropium Bromide-Olodaterol (STIOLTO RESPIMAT) 2.5-2.5 MCG/ACT AERS Inhale 2 puffs into the lungs daily. 4 g 0  ? VELPHORO 500 MG chewable tablet Chew 500 mg by mouth 3 (three) times daily with meals.    ? ?No current facility-administered medications for this encounter.  ? ? ?Allergies:   Imdur [isosorbide nitrate] and Tramadol  ? ?Social History:  The patient  reports that he  has been smoking cigarettes. He has a 5.75 pack-year smoking history. He has never used smokeless tobacco. He reports that he does not currently use alcohol after a past usage of about 1.0 standard drink per w

## 2021-05-29 NOTE — Patient Instructions (Signed)
Medication Changes: ? ?Restart your Amlodipine '5mg'$  daily ? ?Lab Work: ? ?Labs done today, your results will be available in MyChart, we will contact you for abnormal readings. ? ? ?Testing/Procedures: ? ?none ? ?Referrals: ? ?Please follow up with our heart failure pharmacist in 3 weeks ? ? ?Special Instructions // Education: ? ?none ? ?Follow-Up in: 2 months  ? ?At the [facility], you and your health needs are our priority. We have a designated team specialized in the treatment of Heart Failure. This Care Team includes your primary Heart Failure Specialized Cardiologist (physician), Advanced Practice Providers (APPs- Physician Assistants and Nurse Practitioners), and Pharmacist who all work together to provide you with the care you need, when you need it.  ? ?You may see any of the following providers on your designated Care Team at your next follow up: ? ?Dr Glori Bickers ?Dr [name] ?Darrick Grinder, NP ?Lyda Jester, PA ?Jessica Milford,NP ?Marlyce Huge, PA ?Audry Riles, PharmD ? ? ?Please be sure to bring in all your medications bottles to every appointment.  ? ?Need to Contact us: ? ?If you have any questions or concerns before your next appointment please send [name] a message through [place] or call our office at [phone].   ? ?TO LEAVE A MESSAGE FOR THE NURSE SELECT OPTION 2, PLEASE LEAVE A MESSAGE INCLUDING: ?YOUR NAME ?DATE OF BIRTH ?CALL BACK NUMBER ?REASON FOR CALL**this is important as we prioritize the call backs ? ?YOU WILL RECEIVE A CALL BACK THE SAME DAY AS LONG AS YOU CALL BEFORE 4:00 PM ? ? ?

## 2021-06-01 LAB — CULTURE, BLOOD (ROUTINE X 2)
Culture: NO GROWTH
Culture: NO GROWTH
Special Requests: ADEQUATE
Special Requests: ADEQUATE

## 2021-06-09 ENCOUNTER — Observation Stay (HOSPITAL_COMMUNITY)
Admission: EM | Admit: 2021-06-09 | Discharge: 2021-06-11 | Disposition: A | Discharge status: Home / Self Care | Payer: Medicare Other | Attending: Emergency Medicine | Admitting: Emergency Medicine

## 2021-06-09 ENCOUNTER — Emergency Department (HOSPITAL_COMMUNITY): Payer: Medicare Other

## 2021-06-09 ENCOUNTER — Encounter (HOSPITAL_COMMUNITY): Payer: Self-pay | Admitting: Emergency Medicine

## 2021-06-09 ENCOUNTER — Other Ambulatory Visit: Payer: Self-pay

## 2021-06-09 DIAGNOSIS — Z992 Dependence on renal dialysis: Secondary | ICD-10-CM | POA: Diagnosis not present

## 2021-06-09 DIAGNOSIS — Z8679 Personal history of other diseases of the circulatory system: Secondary | ICD-10-CM | POA: Diagnosis not present

## 2021-06-09 DIAGNOSIS — F17210 Nicotine dependence, cigarettes, uncomplicated: Secondary | ICD-10-CM | POA: Diagnosis not present

## 2021-06-09 DIAGNOSIS — J449 Chronic obstructive pulmonary disease, unspecified: Secondary | ICD-10-CM | POA: Insufficient documentation

## 2021-06-09 DIAGNOSIS — I132 Hypertensive heart and chronic kidney disease with heart failure and with stage 5 chronic kidney disease, or end stage renal disease: Secondary | ICD-10-CM | POA: Diagnosis not present

## 2021-06-09 DIAGNOSIS — I5042 Chronic combined systolic (congestive) and diastolic (congestive) heart failure: Secondary | ICD-10-CM | POA: Diagnosis not present

## 2021-06-09 DIAGNOSIS — R0789 Other chest pain: Secondary | ICD-10-CM

## 2021-06-09 DIAGNOSIS — I82C13 Acute embolism and thrombosis of internal jugular vein, bilateral: Secondary | ICD-10-CM | POA: Diagnosis not present

## 2021-06-09 DIAGNOSIS — Z7982 Long term (current) use of aspirin: Secondary | ICD-10-CM | POA: Insufficient documentation

## 2021-06-09 DIAGNOSIS — I82409 Acute embolism and thrombosis of unspecified deep veins of unspecified lower extremity: Secondary | ICD-10-CM | POA: Diagnosis present

## 2021-06-09 DIAGNOSIS — N186 End stage renal disease: Secondary | ICD-10-CM | POA: Diagnosis not present

## 2021-06-09 DIAGNOSIS — Z7901 Long term (current) use of anticoagulants: Secondary | ICD-10-CM | POA: Insufficient documentation

## 2021-06-09 DIAGNOSIS — Z79899 Other long term (current) drug therapy: Secondary | ICD-10-CM | POA: Insufficient documentation

## 2021-06-09 DIAGNOSIS — I82C12 Acute embolism and thrombosis of left internal jugular vein: Secondary | ICD-10-CM

## 2021-06-09 DIAGNOSIS — I10 Essential (primary) hypertension: Secondary | ICD-10-CM | POA: Diagnosis present

## 2021-06-09 LAB — CBC WITH DIFFERENTIAL/PLATELET
Abs Immature Granulocytes: 0.01 10*3/uL (ref 0.00–0.07)
Basophils Absolute: 0 10*3/uL (ref 0.0–0.1)
Basophils Relative: 1 %
Eosinophils Absolute: 0.1 10*3/uL (ref 0.0–0.5)
Eosinophils Relative: 3 %
HCT: 28 % — ABNORMAL LOW (ref 39.0–52.0)
Hemoglobin: 8.9 g/dL — ABNORMAL LOW (ref 13.0–17.0)
Immature Granulocytes: 0 %
Lymphocytes Relative: 24 %
Lymphs Abs: 1.1 10*3/uL (ref 0.7–4.0)
MCH: 32.4 pg (ref 26.0–34.0)
MCHC: 31.8 g/dL (ref 30.0–36.0)
MCV: 101.8 fL — ABNORMAL HIGH (ref 80.0–100.0)
Monocytes Absolute: 0.5 10*3/uL (ref 0.1–1.0)
Monocytes Relative: 10 %
Neutro Abs: 2.9 10*3/uL (ref 1.7–7.7)
Neutrophils Relative %: 62 %
Platelets: 137 10*3/uL — ABNORMAL LOW (ref 150–400)
RBC: 2.75 MIL/uL — ABNORMAL LOW (ref 4.22–5.81)
RDW: 14.9 % (ref 11.5–15.5)
WBC: 4.7 10*3/uL (ref 4.0–10.5)
nRBC: 0 % (ref 0.0–0.2)

## 2021-06-09 LAB — COMPREHENSIVE METABOLIC PANEL
ALT: 11 U/L (ref 0–44)
AST: 19 U/L (ref 15–41)
Albumin: 3.8 g/dL (ref 3.5–5.0)
Alkaline Phosphatase: 53 U/L (ref 38–126)
Anion gap: 9 (ref 5–15)
BUN: 45 mg/dL — ABNORMAL HIGH (ref 6–20)
CO2: 24 mmol/L (ref 22–32)
Calcium: 9.1 mg/dL (ref 8.9–10.3)
Chloride: 107 mmol/L (ref 98–111)
Creatinine, Ser: 9.9 mg/dL — ABNORMAL HIGH (ref 0.61–1.24)
GFR, Estimated: 6 mL/min — ABNORMAL LOW (ref >60)
Glucose, Bld: 96 mg/dL (ref 70–99)
Potassium: 3.8 mmol/L (ref 3.5–5.1)
Sodium: 140 mmol/L (ref 135–145)
Total Bilirubin: 0.7 mg/dL (ref 0.3–1.2)
Total Protein: 6.9 g/dL (ref 6.5–8.1)

## 2021-06-09 LAB — TROPONIN I (HIGH SENSITIVITY): Troponin I (High Sensitivity): 48 ng/L — ABNORMAL HIGH (ref <18)

## 2021-06-09 NOTE — ED Provider Triage Note (Signed)
Emergency Medicine Provider Triage Evaluation Note ? ?[name] , a 51 y.o. male  was evaluated in triage.  Pt complains of chest pain worsening over the past 3 days. Patient reports pain and swelling to his left upper chest, no alleviating/aggravating factors, reports sxs started after a dental surgery 1 month prior- he feels as though the swelling in his jaw area moved down to his neck into the chest slowly over time.  ? ?Review of Systems  ?Positive: Chest pain, swelling ?Negative: Hemoptysis, fever, dental pain currently ? ?Physical Exam  ?BP (!) 159/92 (BP Location: Right Arm)   Pulse 88   Temp 98.7 ?F (37.1 ?C) (Oral)   Resp 16   Ht '5\' 11"'$  (1.803 m)   Wt 70.5 kg   SpO2 98%   BMI 21.68 kg/m?  ?Gen:   Awake, no distress   ?Resp:  Normal effort, airway patent.  ?MSK:   Moves extremities without difficult  ? ?Medical Decision Making  ?Medically screening exam initiated at 10:20 PM.  Appropriate orders placed.  [name] was informed that the remainder of the evaluation will be completed by another provider, this initial triage assessment does not replace that evaluation, and the importance of remaining in the ED until their evaluation is complete. ? ?Chest pain ?  ?Amaryllis Dyke, PA-C ?06/09/21 2222 ? ?

## 2021-06-09 NOTE — ED Triage Notes (Signed)
Increase cp radiating to his neck and back for a week. Neck swollen. ?

## 2021-06-10 ENCOUNTER — Encounter (HOSPITAL_COMMUNITY): Payer: Self-pay

## 2021-06-10 ENCOUNTER — Emergency Department (HOSPITAL_COMMUNITY): Payer: Medicare Other

## 2021-06-10 DIAGNOSIS — I82622 Acute embolism and thrombosis of deep veins of left upper extremity: Secondary | ICD-10-CM | POA: Diagnosis not present

## 2021-06-10 DIAGNOSIS — R079 Chest pain, unspecified: Secondary | ICD-10-CM

## 2021-06-10 DIAGNOSIS — I82C12 Acute embolism and thrombosis of left internal jugular vein: Secondary | ICD-10-CM

## 2021-06-10 DIAGNOSIS — N186 End stage renal disease: Secondary | ICD-10-CM | POA: Diagnosis not present

## 2021-06-10 DIAGNOSIS — I82C13 Acute embolism and thrombosis of internal jugular vein, bilateral: Secondary | ICD-10-CM | POA: Diagnosis not present

## 2021-06-10 DIAGNOSIS — T82868A Thrombosis of vascular prosthetic devices, implants and grafts, initial encounter: Secondary | ICD-10-CM | POA: Diagnosis not present

## 2021-06-10 DIAGNOSIS — I10 Essential (primary) hypertension: Secondary | ICD-10-CM | POA: Diagnosis not present

## 2021-06-10 DIAGNOSIS — Z992 Dependence on renal dialysis: Secondary | ICD-10-CM | POA: Diagnosis not present

## 2021-06-10 DIAGNOSIS — I82409 Acute embolism and thrombosis of unspecified deep veins of unspecified lower extremity: Secondary | ICD-10-CM | POA: Diagnosis present

## 2021-06-10 LAB — CBC
HCT: 28.5 % — ABNORMAL LOW (ref 39.0–52.0)
Hemoglobin: 9.1 g/dL — ABNORMAL LOW (ref 13.0–17.0)
MCH: 32.5 pg (ref 26.0–34.0)
MCHC: 31.9 g/dL (ref 30.0–36.0)
MCV: 101.8 fL — ABNORMAL HIGH (ref 80.0–100.0)
Platelets: 129 10*3/uL — ABNORMAL LOW (ref 150–400)
RBC: 2.8 MIL/uL — ABNORMAL LOW (ref 4.22–5.81)
RDW: 15 % (ref 11.5–15.5)
WBC: 3.5 10*3/uL — ABNORMAL LOW (ref 4.0–10.5)
nRBC: 0 % (ref 0.0–0.2)

## 2021-06-10 LAB — TROPONIN I (HIGH SENSITIVITY)
Troponin I (High Sensitivity): 50 ng/L — ABNORMAL HIGH (ref <18)
Troponin I (High Sensitivity): 27 ng/L — ABNORMAL HIGH (ref <18)

## 2021-06-10 LAB — HEPARIN LEVEL (UNFRACTIONATED): Heparin Unfractionated: 0.32 IU/mL (ref 0.30–0.70)

## 2021-06-10 LAB — HIV ANTIBODY (ROUTINE TESTING W REFLEX): HIV Screen 4th Generation wRfx: NONREACTIVE

## 2021-06-10 MED ORDER — SUCROFERRIC OXYHYDROXIDE 500 MG PO CHEW
500.0000 mg | CHEWABLE_TABLET | Freq: Every day | ORAL | Status: DC
  Administered 2021-06-11: 500 mg via ORAL
  Filled 2021-06-10 (×2): qty 1

## 2021-06-10 MED ORDER — NICOTINE POLACRILEX 2 MG MT GUM
2.0000 mg | CHEWING_GUM | OROMUCOSAL | Status: DC | PRN
  Filled 2021-06-10: qty 1

## 2021-06-10 MED ORDER — HYDROMORPHONE HCL 1 MG/ML IJ SOLN
0.5000 mg | Freq: Once | INTRAMUSCULAR | Status: AC
  Administered 2021-06-10: 0.5 mg via INTRAVENOUS
  Filled 2021-06-10: qty 1

## 2021-06-10 MED ORDER — ACETAMINOPHEN 500 MG PO TABS
1000.0000 mg | ORAL_TABLET | Freq: Four times a day (QID) | ORAL | Status: DC | PRN
  Administered 2021-06-11: 1000 mg via ORAL
  Filled 2021-06-10 (×3): qty 2

## 2021-06-10 MED ORDER — MORPHINE SULFATE (PF) 4 MG/ML IV SOLN
4.0000 mg | Freq: Once | INTRAVENOUS | Status: AC
  Administered 2021-06-10: 4 mg via INTRAVENOUS
  Filled 2021-06-10: qty 1

## 2021-06-10 MED ORDER — ACETAMINOPHEN 325 MG PO TABS: 650.0000 mg | ORAL_TABLET | Freq: Four times a day (QID) | ORAL | Status: DC | PRN

## 2021-06-10 MED ORDER — RENA-VITE PO TABS
1.0000 | ORAL_TABLET | Freq: Every day | ORAL | Status: DC
Start: 2021-06-10 — End: 2021-06-12
  Administered 2021-06-11: 1 via ORAL
  Filled 2021-06-10 (×2): qty 1

## 2021-06-10 MED ORDER — HEPARIN BOLUS VIA INFUSION
4000.0000 [IU] | Freq: Once | INTRAVENOUS | Status: AC
  Administered 2021-06-10: 4000 [IU] via INTRAVENOUS
  Filled 2021-06-10: qty 4000

## 2021-06-10 MED ORDER — CARVEDILOL 25 MG PO TABS
25.0000 mg | ORAL_TABLET | Freq: Two times a day (BID) | ORAL | Status: DC
  Administered 2021-06-11 (×2): 25 mg via ORAL
  Filled 2021-06-10 (×2): qty 1

## 2021-06-10 MED ORDER — ATORVASTATIN CALCIUM 80 MG PO TABS
80.0000 mg | ORAL_TABLET | ORAL | Status: DC
  Administered 2021-06-11: 80 mg via ORAL
  Filled 2021-06-10: qty 1

## 2021-06-10 MED ORDER — NICOTINE 21 MG/24HR TD PT24
21.0000 mg | MEDICATED_PATCH | Freq: Every day | TRANSDERMAL | Status: DC
Start: 2021-06-10 — End: 2021-06-12
  Administered 2021-06-10 – 2021-06-11 (×2): 21 mg via TRANSDERMAL
  Filled 2021-06-10 (×2): qty 1

## 2021-06-10 MED ORDER — ARFORMOTEROL TARTRATE 15 MCG/2ML IN NEBU
15.0000 ug | INHALATION_SOLUTION | Freq: Two times a day (BID) | RESPIRATORY_TRACT | Status: DC
  Administered 2021-06-10 – 2021-06-11 (×2): 15 ug via RESPIRATORY_TRACT
  Filled 2021-06-10 (×3): qty 2

## 2021-06-10 MED ORDER — ALBUTEROL SULFATE HFA 108 (90 BASE) MCG/ACT IN AERS: 1.0000 | INHALATION_SPRAY | Freq: Four times a day (QID) | RESPIRATORY_TRACT | Status: DC | PRN

## 2021-06-10 MED ORDER — AMITRIPTYLINE HCL 25 MG PO TABS
50.0000 mg | ORAL_TABLET | Freq: Every day | ORAL | Status: DC
  Administered 2021-06-10: 50 mg via ORAL
  Filled 2021-06-10: qty 1

## 2021-06-10 MED ORDER — HEPARIN (PORCINE) 25000 UT/250ML-% IV SOLN
1250.0000 [IU]/h | INTRAVENOUS | Status: DC
  Administered 2021-06-10: 1150 [IU]/h via INTRAVENOUS
  Administered 2021-06-11: 1250 [IU]/h via INTRAVENOUS
  Filled 2021-06-10 (×2): qty 250

## 2021-06-10 MED ORDER — HYDRALAZINE HCL 50 MG PO TABS
75.0000 mg | ORAL_TABLET | Freq: Three times a day (TID) | ORAL | Status: DC
  Administered 2021-06-10 – 2021-06-11 (×3): 75 mg via ORAL
  Filled 2021-06-10: qty 1
  Filled 2021-06-10: qty 3
  Filled 2021-06-10: qty 1

## 2021-06-10 MED ORDER — IOHEXOL 350 MG/ML SOLN
100.0000 mL | Freq: Once | INTRAVENOUS | Status: AC | PRN
  Administered 2021-06-10: 75 mL via INTRAVENOUS

## 2021-06-10 MED ORDER — ALBUTEROL SULFATE (2.5 MG/3ML) 0.083% IN NEBU: 2.5000 mg | INHALATION_SOLUTION | Freq: Four times a day (QID) | RESPIRATORY_TRACT | Status: DC | PRN

## 2021-06-10 MED ORDER — UMECLIDINIUM BROMIDE 62.5 MCG/ACT IN AEPB
1.0000 | INHALATION_SPRAY | Freq: Every day | RESPIRATORY_TRACT | Status: DC
  Administered 2021-06-11: 1 via RESPIRATORY_TRACT
  Filled 2021-06-10: qty 7

## 2021-06-10 MED ORDER — LIDOCAINE-PRILOCAINE 2.5-2.5 % EX CREA
1.0000 "application " | TOPICAL_CREAM | CUTANEOUS | Status: DC | PRN
  Filled 2021-06-10: qty 5

## 2021-06-10 MED ORDER — AMLODIPINE BESYLATE 5 MG PO TABS
5.0000 mg | ORAL_TABLET | Freq: Every day | ORAL | Status: DC
  Administered 2021-06-10 – 2021-06-11 (×2): 5 mg via ORAL
  Filled 2021-06-10 (×2): qty 1

## 2021-06-10 NOTE — ED Provider Notes (Signed)
?[facility] ?Provider Note ? ? ?CSN: [id number] ?Arrival date & time: 06/09/21  2134 ? ?  ? ?History ? ?Chief Complaint  ?Patient presents with  ? Chest Pain  ? ? ?[name] is a 51 y.o. male. ? ?HPI ?Patient with multiple medical issues including prior aortic arch repair, now presents with neck pain, chest pain.  Beyond his history of aortic arch repair, patient is on dialysis.  He notes that 3 weeks ago he had his teeth pulled.  This is secondary to prior trauma, infections.  After brief period of swelling, patient improved transiently, but now over the past few days, possibly week he has had increasing swelling in the left lateral neck, and left upper chest pain.  Pain is worse with motion, swallowing, activity.  No fever, vomiting.  No relief with Tylenol. ?  ? ?Home Medications ?Prior to Admission medications   ?Medication Sig Start Date End Date Taking? Authorizing Provider  ?acetaminophen (TYLENOL) 325 MG tablet Take 650 mg by mouth every 6 (six) hours as needed for moderate pain (for pain or headaches).   Yes [provider]  ?albuterol (VENTOLIN HFA) 108 (90 Base) MCG/ACT inhaler Inhale 1 puff into the lungs every 6 (six) hours as needed for wheezing or shortness of breath. 05/24/21  Yes [provider]  ?amitriptyline (ELAVIL) 50 MG tablet Take 50 mg by mouth at bedtime.   Yes [provider]  ?amLODipine (NORVASC) 5 MG tablet Take 1 tablet (5 mg total) by mouth daily. 05/29/21  Yes Larey Dresser, MD  ?aspirin EC 81 MG EC tablet Take 1 tablet (81 mg total) by mouth daily. 12/12/17  Yes [facility], Tami Lin, PA  ?atorvastatin (LIPITOR) 80 MG tablet Take 80 mg by mouth every other day.   Yes [provider]  ?b complex-vitamin c-folic acid (NEPHRO-VITE) 0.8 MG TABS tablet Take 1 tablet by mouth once a week.   Yes [provider]  ?carvedilol (COREG) 25 MG tablet Take 25 mg by mouth 2 (two) times daily with a meal.   Yes  [provider]  ?Evolocumab (REPATHA SURECLICK) 458 MG/ML SOAJ Inject 1 Dose into the skin every 14 (fourteen) days. 03/11/21  Yes Hilty, Nadean Corwin, MD  ?hydrALAZINE (APRESOLINE) 50 MG tablet Take 1.5 tablets (75 mg total) by mouth 3 (three) times daily. 10/02/20 11/25/21 Yes Milford, Maricela Bo, FNP  ?HYDROcodone-acetaminophen (NORCO) 10-325 MG tablet Take 1 tablet by mouth every 6 (six) hours as needed. 05/15/21  Yes [provider]  ?lidocaine-prilocaine (EMLA) cream Apply 1 application. topically as needed (prior to port being accessed). Use on dialysis days (Tues, Thurs and Sat)   Yes [provider]  ?Tiotropium Bromide-Olodaterol (STIOLTO RESPIMAT) 2.5-2.5 MCG/ACT AERS Inhale 2 puffs into the lungs daily. 02/28/21  Yes Milford, Maricela Bo, FNP  ?VELPHORO 500 MG chewable tablet Chew 500 mg by mouth daily after supper. 01/30/21  Yes [provider]  ?   ? ?Allergies    ?Imdur [isosorbide nitrate] and Tramadol   ? ?Review of Systems   ?Review of Systems  ?Constitutional:   ?     Per HPI, otherwise negative  ?HENT:    ?     Per HPI, otherwise negative  ?Respiratory:    ?     Per HPI, otherwise negative  ?Cardiovascular:   ?     Per HPI, otherwise negative  ?Gastrointestinal:  Negative for vomiting.  ?Endocrine:  ?     Negative  aside from HPI  ?Genitourinary:   ?     Neg aside from HPI   ?Musculoskeletal:   ?     Per HPI, otherwise negative  ?Skin: Negative.   ?Allergic/Immunologic: Positive for immunocompromised state.  ?Neurological:  Negative for syncope.  ? ?Physical Exam ?Updated Vital Signs ?BP (!) 159/92   Pulse 79   Temp 98.7 ?F (37.1 ?C) (Oral)   Resp 19   Ht '5\' 11"'$  (1.803 m)   Wt 70.5 kg   SpO2 98%   BMI 21.68 kg/m?  ?Physical Exam ?Vitals and nursing note reviewed.  ?Constitutional:   ?   General: He is not in acute distress. ?   Appearance: He is well-developed.  ?HENT:  ?   Head: Normocephalic and atraumatic.  ?Eyes:  ?   Conjunctiva/sclera: Conjunctivae normal.   ?Neck:  ? ?Cardiovascular:  ?   Rate and Rhythm: Normal rate and regular rhythm.  ?   Comments: Left AV fistula with palpable thrill ?Pulmonary:  ?   Effort: Pulmonary effort is normal. No respiratory distress.  ?   Breath sounds: No stridor.  ?Abdominal:  ?   General: There is no distension.  ?Skin: ?   General: Skin is warm and dry.  ?Neurological:  ?   Mental Status: He is alert and oriented to person, place, and time.  ? ? ?ED Results / Procedures / Treatments   ?Labs ?(all labs ordered are listed, but only abnormal results are displayed) ?Labs Reviewed  ?CBC WITH DIFFERENTIAL/PLATELET - Abnormal; Notable for the following components:  ?    Result Value  ? RBC 2.75 (*)   ? Hemoglobin 8.9 (*)   ? HCT 28.0 (*)   ? MCV 101.8 (*)   ? Platelets 137 (*)   ? All other components within normal limits  ?COMPREHENSIVE METABOLIC PANEL - Abnormal; Notable for the following components:  ? BUN 45 (*)   ? Creatinine, Ser 9.90 (*)   ? GFR, Estimated 6 (*)   ? All other components within normal limits  ?CBC - Abnormal; Notable for the following components:  ? WBC 3.5 (*)   ? RBC 2.80 (*)   ? Hemoglobin 9.1 (*)   ? HCT 28.5 (*)   ? MCV 101.8 (*)   ? Platelets 129 (*)   ? All other components within normal limits  ?TROPONIN I (HIGH SENSITIVITY) - Abnormal; Notable for the following components:  ? Troponin I (High Sensitivity) 48 (*)   ? All other components within normal limits  ?TROPONIN I (HIGH SENSITIVITY) - Abnormal; Notable for the following components:  ? Troponin I (High Sensitivity) 50 (*)   ? All other components within normal limits  ?HEPARIN LEVEL (UNFRACTIONATED)  ? ? ?EKG ?EKG Interpretation ? ?Date/Time:  Monday June 09 2021 22:07:28 EDT ?Ventricular Rate:  87 ?PR Interval:  162 ?QRS Duration: 96 ?QT Interval:  380 ?QTC Calculation: 457 ?R Axis:   46 ?Text Interpretation: Normal sinus rhythm Possible Left atrial enlargement Anterior infarct , age undetermined ST-t wave abnormality No significant change since last  tracing Abnormal ECG Confirmed by Carmin Muskrat [id number]) on 06/10/2021 8:48:33 AM ? ?Radiology ?DG Chest 2 View ? ?Result Date: 06/09/2021 ?CLINICAL DATA:  Chest pain EXAM: CHEST - 2 VIEW COMPARISON:  05/27/2021 FINDINGS: Cardiac shadow is enlarged but stable. Loop recorder is noted. Postsurgical changes and aortic stent graft are again seen and stable. Chronic scarring is again seen in the left base. No focal infiltrate or effusion is noted.  No bony abnormality is seen. IMPRESSION: Chronic changes without acute abnormality. Electronically Signed   By: Inez Catalina M.D.   On: 06/09/2021 22:50  ? ?CT Soft Tissue Neck W Contrast ? ?Addendum Date: 06/10/2021   ?ADDENDUM REPORT: 06/10/2021 11:08 ADDENDUM: Study discussed by telephone with Dr. Carmin Muskrat on 06/10/2021 at 1055 hours. Electronically Signed   By: Genevie Ann M.D.   On: 06/10/2021 11:08  ? ?Result Date: 06/10/2021 ?CLINICAL DATA:  51 year old male with venous distension in the left neck with swelling and pain over the past 3 days. Symptom onset after dental surgery 1 month ago. EXAM: CT NECK WITH CONTRAST TECHNIQUE: Multidetector CT imaging of the neck was performed using the standard protocol following the bolus administration of intravenous contrast. RADIATION DOSE REDUCTION: This exam was performed according to the departmental dose-optimization program which includes automated exposure control, adjustment of the mA and/or kV according to patient size and/or use of iterative reconstruction technique. CONTRAST:  29m OMNIPAQUE IOHEXOL 350 MG/ML SOLN COMPARISON:  Chest radiographs 06/09/2021. CTA chest today is reported separately. Neck CT 12/27/2012. FINDINGS: Pharynx and larynx: Laryngeal and pharyngeal soft tissue contours are stable since 2014 and within normal limits. No parapharyngeal or retropharyngeal space fluid or inflammation. Salivary glands: Sublingual space is within normal limits. Submandibular glands and parotid glands are symmetric and within  normal limits. Thyroid: Negative. Lymph nodes: Negative.  No cervical lymphadenopathy. 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

## 2021-06-10 NOTE — Progress Notes (Signed)
?  Transition of Care (TOC) Screening Note ? ? ?Patient Details  ?Name: [name] ?Date of Birth: 05-09-1970 ? ? ?Transition of Care (TOC) CM/SW Contact:    ?Cormac Wint, LCSW ?Phone Number: ?06/10/2021, 3:56 PM ? ? ? ?Transition of Care Department [facility]) has reviewed patient and no TOC needs have been identified at this time. We will continue to monitor patient advancement through interdisciplinary progression rounds. Patient will benefit from PT/OT consult for disposition recommendations. If new patient transition needs arise, please place a TOC consult. ?  ?

## 2021-06-10 NOTE — ED Notes (Signed)
Pt reports constant aching chest pain on left side of chest that radiates up throat and down left arm that's gotten worse last 2 days. He had 4 teeth pulled a month ago and has swelling on left side of neck. He reports swelling from mouth has gone done but swelling in neck has increased.  ?

## 2021-06-10 NOTE — Progress Notes (Signed)
ANTICOAGULATION CONSULT NOTE ? ?Pharmacy Consult for heparin ?Indication:  L IJ thrombus ? ?Allergies  ?Allergen Reactions  ? Imdur [Isosorbide Nitrate] Other (See Comments)  ?  Headaches -- patient instructed to continue taking ?  ? Tramadol Other (See Comments)  ?  Headaches ?  ? ? ?Patient Measurements: ?Height: '5\' 11"'$  (180.3 cm) ?Weight: 70.5 kg (155 lb 6.8 oz) ?IBW/kg (Calculated) : 75.3 ?Heparin Dosing Weight: 70.5 kg  ? ?Vital Signs: ?BP: 138/83 (03/28 1700) ?Pulse Rate: 72 (03/28 1700) ? ?Labs: ?Recent Labs  ?  06/09/21 ?2238 06/10/21 ?0117 06/10/21 ?1240 06/10/21 ?1627 06/10/21 ?2119  ?HGB 8.9*  --  9.1*  --   --   ?HCT 28.0*  --  28.5*  --   --   ?PLT 137*  --  129*  --   --   ?HEPARINUNFRC  --   --   --   --  0.32  ?CREATININE 9.90*  --   --   --   --   ?TROPONINIHS 48* 50*  --  27*  --   ? ? ? ?Estimated Creatinine Clearance: 9.1 mL/min (A) (by C-G formula based on SCr of 9.9 mg/dL (H)). ? ? ?Medical History: ?Past Medical History:  ?Diagnosis Date  ? Adenomatous colon polyp 08/2015  ? Anxiety   ? Aortic disease ([place])   ? Aortic dissection ([place])   ? a. admx 04/2014 >> L renal infarct; a/c renal failure >> b.  s/p Bioprosthetic Bentall and total arch replacement and staged endovascular repair of descending aortic aneurysm ([facility] - Dr. [name])  ? CAD (coronary artery disease)   ? a. LHC 4/16:  oD1 60%  ? Cardiomyopathy ([place])   ? a. non-ischemic - probably related to untreated HTN and ETOH abuse - Echo 3/13 with EF 35-40% >> b. Echo 4/16: Severe LVH, EF 55-60%, moderate AI, moderate MR, mild LAE, trivial effusion, known type B dissection with communication between true and false lumens with suprasternal images suggesting dissection plane may propagate to at least left subclavian takeoff, root above aortic valve okay    ? Chronic abdominal pain   ? Chronic combined systolic and diastolic congestive heart failure ([place])   ? a. 05/2011: Adm with pulm edema/HTN urgency, EF 35-40% with diffuse hypokinesis and  moderate to severe mitral regurgitation. Cardiomyopathy likely due to uncontrolled HTN and ETOH abuse - cath deferred due to renal insufficiency (felt due to uncontrolled HTN). bJodie Echevaria MV 06/2011: EF 37% and no ischemia or infarction. c. EF 45-50% by echo 01/2012.  ? Chronic sinusitis   ? CKD (chronic kidney disease)   ? dialysis m,t,th,fri  ? DDD (degenerative disc disease), lumbar   ? Depression   ? Descending thoracic aortic aneurysm   ? Dissecting aneurysm of thoracic aorta   ? ETOH abuse   ? a. Reported to have quit 05/2011.  ? Frequent headaches   ? GERD (gastroesophageal reflux disease)   ? Headache(784.0)   ? "q other day" (08/08/2013)  ? Heart murmur   ? Hemorrhoid thrombosis   ? History of echocardiogram   ? Echo 1/17:  Severe LVH, EF 55-60%, no RWMA, Gr 2 DD, AVR ok, mild to mod MR, mild LAE, mild reduced RVSF, mod RAE  ? History of medication noncompliance   ? HYPERLIPIDEMIA   ? Hypertension   ? a. Hx of HTN urgency secondary to noncompliance. b. urinary metanephrine and catecholeamine levels normal 2013.  c. Renal art [name] 1/16:  No evidence of renal artery  stenosis noted bilaterally.  ? INGUINAL HERNIA   ? Pneumonia ~ 2013  ? Renal insufficiency   ? Serrated adenoma of colon 08/2015  ? Stroke [facility])   ? Tobacco abuse   ? Valvular heart disease   ? a. Echo 05/2011: moderate to severe eccentric MR and mild to moderate AI with prolapsing left coronary cusp. b. Echo 01/2012: mild-mod AI, mild dilitation of aortic root, mild MR.;  c. Echo 1/16: Severe LVH consistent with hypertrophic cardio myopathy, EF 50%, no RWMA, mod AI, mild MR, mild RAE, dilated Ao root (40 mm);   ? ? ?Assessment: ?51 yo male presented on 06/09/2021. Pharmacy consulted to dose heparin for L IJ thrombus. CBC yesterday - Hgb 8.9, Plt 137.  ? ?Initial heparin level therapeutic at 0.32 - will empirically increase rate slightly to prevent subtherapeutic drop. ? ?Goal of Therapy:  ?Heparin level 0.3-0.7 units/ml ?Monitor platelets by  anticoagulation protocol: Yes ?  ?Plan:  ?Increase heparin to 1250 units/h ?Daily heparin level, CBC ? ?Arrie Senate, PharmD, BCPS, BCCP ?Clinical Pharmacist ?[phone] ?Please check AMION for all [place] numbers ?06/10/2021 ? ? ? ? ?

## 2021-06-10 NOTE — H&P (Addendum)
? ? ? ?Date: 06/10/2021     ?     ?     ?Patient Name:  [name] MRN: [id number]  ?DOB: 1970/06/06 Age / Sex: 51 y.o., male   ?PCP: Arthur Holms, NP    ?     ?Medical Service: Internal Medicine Teaching Service    ?     ?Attending Physician: Dr. Heber Granjeno, Rachel Moulds, DO    ?First Contact: Dr. Raymondo Band Pager: [phone]  ?Second Contact: Dr. Coy Saunas Pager: [phone]  ?     ?After Hours (After 5p/  First Contact Pager: [phone]  ?weekends / holidays): Second Contact Pager: [phone]  ? ?Chief Complaint: Neck swelling ? 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 He is presenting to the ED with symptoms of left sided chest pain, left neck swelling, and left arm swelling.  ? ?He states the neck and left arm swelling has been going on for 3 weeks after his dental procedure with 5 teeth extraction. He delayed getting care as he thought this swelling was coming from that procedure. But when the swelling in his face improved, the swelling in the neck continued to worsen.  ? ?He states his chest pain is chronic and has been dealing with it for 5 to 6 years. He did endorse a change in quality stating this is more achy and diffuse with neck pain and left arm pain present as well. States the pain is 5/10 and exertional.  ? ?His neck and left arm swelling has been getting worse since onset. He states the swelling came on suddenly and it was painful initially. It is dull achy in quality. Some improvement with pain medication but no aggravating factors identified.  ? ?On ROS he is denying fevers, chills, blurry vision, abdominal pain, n/v/d/c, no joint or muscle pain. He is endorsing dysphagia, and shortness of breath.  For the shortness of breath he states that is chronic for him and the inhalers help but for the dysphagia he sates started after the neck swelling.   ? ?PCP is [facility] and last seen on 04/2021. He sees Cardiologist, Dr. Aundra Dubin, and last OP appointment in 04/2021. He sees nephrology weekly during HD.  ? ?ED course: Patient presented to the ED with neck swelling and left arm swelling.  Lab and imaging was ordered.  Imaging showed a partial thrombus formation in the left internal jugular.  Patient was started on heparin drip.  Cardiology and vascular surgery were consulted by the ED provider.  IMTS was consulted for admission. ? ?Review of Systems: Negative unless stated otherwise in the HPI.  ? ?Past Medical History:  ?Diagnosis Date  ? Adenomatous colon polyp 08/2015  ? Anxiety   ? Aortic disease ([place])   ? Aortic dissection ([place])   ? a. admx 04/2014 >> L renal infarct; a/c renal failure >> b.  s/p Bioprosthetic Bentall and total arch replacement and staged endovascular repair of descending aortic aneurysm ([facility] - Dr. [name])  ? CAD (coronary artery disease)   ? a. LHC 4/16:  oD1 60%  ? Cardiomyopathy ([place])   ? a. non-ischemic - probably related to untreated HTN and ETOH abuse -  Echo 3/13 with EF 35-40% >> b. Echo 4/16: Severe LVH, EF 55-60%, moderate AI, moderate MR, mild LAE, trivial effusion, known type B dissection with communication between true and false lumens with suprasternal images suggesting dissection plane may propagate to at least left subclavian takeoff, root above aortic valve okay    ? Chronic abdominal pain   ? Chronic combined systolic and diastolic congestive heart failure ([place])   ? a. 05/2011: Adm with pulm edema/HTN urgency, EF 35-40% with diffuse hypokinesis and moderate to severe mitral regurgitation. Cardiomyopathy likely due to uncontrolled HTN and ETOH abuse - cath deferred due to renal insufficiency (felt due to uncontrolled HTN). bJodie Echevaria MV 06/2011: EF 37% and no ischemia or  infarction. c. EF 45-50% by echo 01/2012.  ? Chronic sinusitis   ? CKD (chronic kidney disease)   ? dialysis m,t,th,fri  ? DDD (degenerative disc disease), lumbar   ? Depression   ? Descending thoracic aortic aneurysm   ? Dissecting aneurysm of thoracic aorta   ? ETOH abuse   ? a. Reported to have quit 05/2011.  ? Frequent headaches   ? GERD (gastroesophageal reflux disease)   ? Headache(784.0)   ? "q other day" (08/08/2013)  ? Heart murmur   ? Hemorrhoid thrombosis   ? History of echocardiogram   ? Echo 1/17:  Severe LVH, EF 55-60%, no RWMA, Gr 2 DD, AVR ok, mild to mod MR, mild LAE, mild reduced RVSF, mod RAE  ? History of medication noncompliance   ? HYPERLIPIDEMIA   ? Hypertension   ? a. Hx of HTN urgency secondary to noncompliance. b. urinary metanephrine and catecholeamine levels normal 2013.  c. Renal art [name] 1/16:  No evidence of renal artery stenosis noted bilaterally.  ? INGUINAL HERNIA   ? Pneumonia ~ 2013  ? Renal insufficiency   ? Serrated adenoma of colon 08/2015  ? Stroke [facility])   ? Tobacco abuse   ? Valvular heart disease   ? a. Echo 05/2011: moderate to severe eccentric MR and mild to moderate AI with prolapsing left coronary cusp. b. Echo 01/2012: mild-mod AI, mild dilitation of aortic root, mild MR.;  c. Echo 1/16: Severe LVH consistent with hypertrophic cardio myopathy, EF 50%, no RWMA, mod AI, mild MR, mild RAE, dilated Ao root (40 mm);   ? ? ?Meds: Not currently taking Norco. Only short course prescribed.  ?Current Meds  ?Medication Sig  ? acetaminophen (TYLENOL) 325 MG tablet Take 650 mg by mouth every 6 (six) hours as needed for moderate pain (for pain or headaches).  ? albuterol (VENTOLIN HFA) 108 (90 Base) MCG/ACT inhaler Inhale 1 puff into the lungs every 6 (six) hours as needed for wheezing or shortness of breath.  ? amitriptyline (ELAVIL) 50 MG tablet Take 50 mg by mouth at bedtime.  ? amLODipine (NORVASC) 5 MG tablet Take 1 tablet (5 mg total) by mouth daily.  ? aspirin EC 81 MG EC tablet  Take 1 tablet (81 mg total) by mouth daily.  ? atorvastatin (LIPITOR) 80 MG tablet Take 80 mg by mouth every other day.  ? b complex-vitamin c-folic acid (NEPHRO-VITE) 0.8 MG TABS tablet Take 1 tablet by mouth once a week.  ? carvedilol (COREG) 25 MG tablet Take 25 mg by mouth 2 (two) times daily with a meal.  ? Evolocumab (REPATHA SURECLICK) 644 MG/ML SOAJ Inject 1 Dose into the skin every 14 (fourteen) days.  ? hydrALAZINE (APRESOLINE) 50 MG tablet Take 1.5 tablets (75 mg total) by  mouth 3 (three) times daily.  ? HYDROcodone-acetaminophen (NORCO) 10-325 MG tablet Take 1 tablet by mouth every 6 (six) hours as needed.  ? lidocaine-prilocaine (EMLA) cream Apply 1 application. topically as needed (prior to port being accessed). Use on dialysis days (Tues, Thurs and Sat)  ? Tiotropium Bromide-Olodaterol (STIOLTO RESPIMAT) 2.5-2.5 MCG/ACT AERS Inhale 2 puffs into the lungs daily.  ? VELPHORO 500 MG chewable tablet Chew 500 mg by mouth daily after supper.  ? ? ? ?Allergies: Both of the meds Imdur and Tramadol give headache.  ?Allergies as of 06/09/2021 - Review Complete 06/09/2021  ?Allergen Reaction Noted  ? Imdur [isosorbide nitrate] Other (See Comments) 09/19/2012  ? Tramadol Other (See Comments) 11/01/2014  ? ? ?Family History: Son has heart diease at 17 and states it is CHF.  ?Family History  ?Problem Relation Age of Onset  ? Hypertension Mother   ? Hypertension Other   ? Colon cancer Paternal Uncle   ? Stroke Maternal Aunt   ? Heart attack Brother   ? Hypertension Brother   ? Diabetes Maternal Aunt   ? Lung cancer Maternal Uncle   ? Stroke Maternal Uncle   ? Other Sister   ?     "breathing machine at night"  ? Headache Sister   ? Thyroid disease Sister   ? Throat cancer Neg Hx   ? Pancreatic cancer Neg Hx   ? Esophageal cancer Neg Hx   ? Kidney disease Neg Hx   ? Liver disease Neg Hx   ? ? ?Social History:  ?Lives with wife and daughter in [place]. Can do all ADLs and iADLs. He smokes but is trying to quit. Was 1 ppd  and now down to 1/4 ppd. 1 ppd for 15 years. He was enrolled in a program at [facility] to quit smoking. Drinks alcohol occasionally but previously reports alcohol use disorder 8 years ago. Marijuana use daily. Dis

## 2021-06-10 NOTE — Consult Note (Signed)
? ?ASSESSMENT & PLAN  ? ?LEFT IJ DVT: This patient has partially occlusive thrombus in the left IJ.  Given that this is symptomatic (pain and swelling) I would recommend 3 months of anticoagulation.  Since he is being admitted to the hospital plan is to put him on heparin and he can be converted to a DOAC.  I can arrange follow-up in 3 months with a duplex of his neck. ? ?END-STAGE RENAL DISEASE: This patient has a functioning left upper arm fistula.  He underwent venoplasty of a left brachiocephalic vein stenosis, left subclavian vein stenosis, and stenosis in his fistula in December of last year.  On the CT it appears that this is open.  He is at risk for recurrent central venous stenosis in the left brachiocephalic vein.  Currently the fistula has an excellent thrill and is not pulsatile so at this point I would not recommend a fistulogram unless his symptoms progress in the left upper extremity. ? ?Vascular surgery will follow. ? ?REASON FOR CONSULT:   ? ?Partially occlusive thrombus in the left IJ.  The consult is requested by Dr. Vanita Panda.  ? ?HPI:  ? ?[name] is a 51 y.o. male who presents with a chief complaint of neck pain and neck swelling in addition to some left arm pain.   ? ?He has a complicated history.  In 2016 he had presented with an aortic dissection.  I believe this was repaired at [facility].  He required replacement of his aortic arch and then had staged endovascular repair of his descending thoracic aorta.  He also has a history of combined systolic and diastolic congestive heart failure, nonischemic cardiomyopathy, and coronary artery disease. ? ?He has end-stage renal disease and dialyzes on Tuesdays Thursdays and Saturdays.  In December 2022 he was set up for a fistulogram because his fistula was not working well.  He was found to have a left brachiocephalic vein stenosis, a left subclavian vein stenosis, and stenosis within his left brachial basilic fistula.  These were all addressed with  balloon angioplasty. ? ?He tells me that on March 2 he had some teeth removed.  He says he was asleep for this so does not remember the details.  However he subsequently developed some neck pain and swelling on the left.  He presented to the emergency department and a CT scan showed partially occlusive thrombus in the left IJ.  For this reason vascular surgery was consulted. ? ?His main complaint is some swelling in the left neck and some pain in the left neck in addition to occasional pain in the left arm.  He denies chest pain.  He dialyzes on Tuesdays Thursdays and Saturdays and tells me that his fistula has been working well. ? ?Past Medical History:  ?Diagnosis Date  ? Adenomatous colon polyp 08/2015  ? Anxiety   ? Aortic disease ([place])   ? Aortic dissection ([place])   ? a. admx 04/2014 >> L renal infarct; a/c renal failure >> b.  s/p Bioprosthetic Bentall and total arch replacement and staged endovascular repair of descending aortic aneurysm ([facility] - Dr. [name])  ? CAD (coronary artery disease)   ? a. LHC 4/16:  oD1 60%  ? Cardiomyopathy ([place])   ? a. non-ischemic - probably related to untreated HTN and ETOH abuse - Echo 3/13 with EF 35-40% >> b. Echo 4/16: Severe LVH, EF 55-60%, moderate AI, moderate MR, mild LAE, trivial effusion, known type B dissection with communication between true and false lumens  with suprasternal images suggesting dissection plane may propagate to at least left subclavian takeoff, root above aortic valve okay    ? Chronic abdominal pain   ? Chronic combined systolic and diastolic congestive heart failure ([place])   ? a. 05/2011: Adm with pulm edema/HTN urgency, EF 35-40% with diffuse hypokinesis and moderate to severe mitral regurgitation. Cardiomyopathy likely due to uncontrolled HTN and ETOH abuse - cath deferred due to renal insufficiency (felt due to uncontrolled HTN). bJodie Echevaria MV 06/2011: EF 37% and no ischemia or infarction. c. EF 45-50% by echo 01/2012.  ? Chronic sinusitis   ? CKD  (chronic kidney disease)   ? dialysis m,t,th,fri  ? DDD (degenerative disc disease), lumbar   ? Depression   ? Descending thoracic aortic aneurysm   ? Dissecting aneurysm of thoracic aorta   ? ETOH abuse   ? a. Reported to have quit 05/2011.  ? Frequent headaches   ? GERD (gastroesophageal reflux disease)   ? Headache(784.0)   ? "q other day" (08/08/2013)  ? Heart murmur   ? Hemorrhoid thrombosis   ? History of echocardiogram   ? Echo 1/17:  Severe LVH, EF 55-60%, no RWMA, Gr 2 DD, AVR ok, mild to mod MR, mild LAE, mild reduced RVSF, mod RAE  ? History of medication noncompliance   ? HYPERLIPIDEMIA   ? Hypertension   ? a. Hx of HTN urgency secondary to noncompliance. b. urinary metanephrine and catecholeamine levels normal 2013.  c. Renal art [name] 1/16:  No evidence of renal artery stenosis noted bilaterally.  ? INGUINAL HERNIA   ? Pneumonia ~ 2013  ? Renal insufficiency   ? Serrated adenoma of colon 08/2015  ? Stroke [facility])   ? Tobacco abuse   ? Valvular heart disease   ? a. Echo 05/2011: moderate to severe eccentric MR and mild to moderate AI with prolapsing left coronary cusp. b. Echo 01/2012: mild-mod AI, mild dilitation of aortic root, mild MR.;  c. Echo 1/16: Severe LVH consistent with hypertrophic cardio myopathy, EF 50%, no RWMA, mod AI, mild MR, mild RAE, dilated Ao root (40 mm);   ? ? ?Family History  ?Problem Relation Age of Onset  ? Hypertension Mother   ? Hypertension Other   ? Colon cancer Paternal Uncle   ? Stroke Maternal Aunt   ? Heart attack Brother   ? Hypertension Brother   ? Diabetes Maternal Aunt   ? Lung cancer Maternal Uncle   ? Stroke Maternal Uncle   ? Other Sister   ?     "breathing machine at night"  ? Headache Sister   ? Thyroid disease Sister   ? Throat cancer Neg Hx   ? Pancreatic cancer Neg Hx   ? Esophageal cancer Neg Hx   ? Kidney disease Neg Hx   ? Liver disease Neg Hx   ? ? ?SOCIAL HISTORY: ?Social History  ? ?Tobacco Use  ? Smoking status: Some Days  ?  Packs/day: 0.25  ?  Years: 23.00   ?  Pack years: 5.75  ?  Types: Cigarettes  ? Smokeless tobacco: Never  ? Tobacco comments:  ?  3 to 4 per day  ?Substance Use Topics  ? Alcohol use: Not Currently  ?  Alcohol/week: 1.0 standard drink  ?  Types: 1 Cans of beer per week  ?  Comment: On special occasion  ? ? ?Allergies  ?Allergen Reactions  ? Imdur [Isosorbide Nitrate] Other (See Comments)  ?  Headaches -- patient  instructed to continue taking ?  ? Tramadol Other (See Comments)  ?  Headaches ?  ? ? ?Current Facility-Administered Medications  ?Medication Dose Route Frequency Provider Last Rate Last Admin  ? heparin ADULT infusion 100 units/mL (25000 units/227m)  1,150 Units/hr Intravenous Continuous BHenri Medal RPH 11.5 mL/hr at 06/10/21 1242 1,150 Units/hr at 06/10/21 1242  ? ?Current Outpatient Medications  ?Medication Sig Dispense Refill  ? acetaminophen (TYLENOL) 325 MG tablet Take 650 mg by mouth every 6 (six) hours as needed for moderate pain (for pain or headaches).    ? albuterol (VENTOLIN HFA) 108 (90 Base) MCG/ACT inhaler Inhale 1 puff into the lungs every 6 (six) hours as needed for wheezing or shortness of breath.    ? amitriptyline (ELAVIL) 50 MG tablet Take 50 mg by mouth at bedtime.    ? amLODipine (NORVASC) 5 MG tablet Take 1 tablet (5 mg total) by mouth daily. 180 tablet 3  ? aspirin EC 81 MG EC tablet Take 1 tablet (81 mg total) by mouth daily. 90 tablet 3  ? atorvastatin (LIPITOR) 80 MG tablet Take 80 mg by mouth every other day.    ? b complex-vitamin c-folic acid (NEPHRO-VITE) 0.8 MG TABS tablet Take 1 tablet by mouth once a week.    ? carvedilol (COREG) 25 MG tablet Take 25 mg by mouth 2 (two) times daily with a meal.    ? Evolocumab (REPATHA SURECLICK) 1165MG/ML SOAJ Inject 1 Dose into the skin every 14 (fourteen) days. 2 mL 11  ? hydrALAZINE (APRESOLINE) 50 MG tablet Take 1.5 tablets (75 mg total) by mouth 3 (three) times daily. 135 tablet 1  ? HYDROcodone-acetaminophen (NORCO) 10-325 MG tablet Take 1 tablet by mouth every 6  (six) hours as needed.    ? lidocaine-prilocaine (EMLA) cream Apply 1 application. topically as needed (prior to port being accessed). Use on dialysis days (Tues, Thurs and Sat)    ? Tiotropium Bromide-

## 2021-06-10 NOTE — Progress Notes (Signed)
ANTICOAGULATION CONSULT NOTE - Initial Consult ? ?Pharmacy Consult for heparin ?Indication:  L IJ thrombus ? ?Allergies  ?Allergen Reactions  ? Imdur [Isosorbide Nitrate] Other (See Comments)  ?  Headaches -- patient instructed to continue taking ?  ? Tramadol Other (See Comments)  ?  Headaches ?  ? ? ?Patient Measurements: ?Height: '5\' 11"'$  (180.3 cm) ?Weight: 70.5 kg (155 lb 6.8 oz) ?IBW/kg (Calculated) : 75.3 ?Heparin Dosing Weight: 70.5 kg  ? ?Vital Signs: ?BP: 151/69 (03/28 1100) ?Pulse Rate: 76 (03/28 1100) ? ?Labs: ?Recent Labs  ?  06/09/21 ?2238 06/10/21 ?0117  ?HGB 8.9*  --   ?HCT 28.0*  --   ?PLT 137*  --   ?CREATININE 9.90*  --   ?TROPONINIHS 48* 50*  ? ? ?Estimated Creatinine Clearance: 9.1 mL/min (A) (by C-G formula based on SCr of 9.9 mg/dL (H)). ? ? ?Medical History: ?Past Medical History:  ?Diagnosis Date  ? Adenomatous colon polyp 08/2015  ? Anxiety   ? Aortic disease ([place])   ? Aortic dissection ([place])   ? a. admx 04/2014 >> L renal infarct; a/c renal failure >> b.  s/p Bioprosthetic Bentall and total arch replacement and staged endovascular repair of descending aortic aneurysm ([facility] - Dr. [name])  ? CAD (coronary artery disease)   ? a. LHC 4/16:  oD1 60%  ? Cardiomyopathy ([place])   ? a. non-ischemic - probably related to untreated HTN and ETOH abuse - Echo 3/13 with EF 35-40% >> b. Echo 4/16: Severe LVH, EF 55-60%, moderate AI, moderate MR, mild LAE, trivial effusion, known type B dissection with communication between true and false lumens with suprasternal images suggesting dissection plane may propagate to at least left subclavian takeoff, root above aortic valve okay    ? Chronic abdominal pain   ? Chronic combined systolic and diastolic congestive heart failure ([place])   ? a. 05/2011: Adm with pulm edema/HTN urgency, EF 35-40% with diffuse hypokinesis and moderate to severe mitral regurgitation. Cardiomyopathy likely due to uncontrolled HTN and ETOH abuse - cath deferred due to renal insufficiency (felt  due to uncontrolled HTN). bJodie Echevaria MV 06/2011: EF 37% and no ischemia or infarction. c. EF 45-50% by echo 01/2012.  ? Chronic sinusitis   ? CKD (chronic kidney disease)   ? dialysis m,t,th,fri  ? DDD (degenerative disc disease), lumbar   ? Depression   ? Descending thoracic aortic aneurysm   ? Dissecting aneurysm of thoracic aorta   ? ETOH abuse   ? a. Reported to have quit 05/2011.  ? Frequent headaches   ? GERD (gastroesophageal reflux disease)   ? Headache(784.0)   ? "q other day" (08/08/2013)  ? Heart murmur   ? Hemorrhoid thrombosis   ? History of echocardiogram   ? Echo 1/17:  Severe LVH, EF 55-60%, no RWMA, Gr 2 DD, AVR ok, mild to mod MR, mild LAE, mild reduced RVSF, mod RAE  ? History of medication noncompliance   ? HYPERLIPIDEMIA   ? Hypertension   ? a. Hx of HTN urgency secondary to noncompliance. b. urinary metanephrine and catecholeamine levels normal 2013.  c. Renal art [name] 1/16:  No evidence of renal artery stenosis noted bilaterally.  ? INGUINAL HERNIA   ? Pneumonia ~ 2013  ? Renal insufficiency   ? Serrated adenoma of colon 08/2015  ? Stroke [facility])   ? Tobacco abuse   ? Valvular heart disease   ? a. Echo 05/2011: moderate to severe eccentric MR and mild to moderate AI  with prolapsing left coronary cusp. b. Echo 01/2012: mild-mod AI, mild dilitation of aortic root, mild MR.;  c. Echo 1/16: Severe LVH consistent with hypertrophic cardio myopathy, EF 50%, no RWMA, mod AI, mild MR, mild RAE, dilated Ao root (40 mm);   ? ? ?Assessment: ?51 yo male presented on 06/09/2021. Pharmacy consulted to dose heparin for L IJ thrombus. CBC yesterday - Hgb 8.9, Plt 137.  ? ?Goal of Therapy:  ?Heparin level 0.3-0.7 units/ml ?Monitor platelets by anticoagulation protocol: Yes ?  ?Plan:  ?Heparin 4000 unit bolus followed by heparin 1150 units/hr  ?Check 8 hr heparin level ?Obtain CBC now - will follow up  ?Monitor heparin level, CBC and s/s of bleeding  ? ?Cristela Felt, PharmD, BCPS ?Clinical Pharmacist ?06/10/2021 11:32  AM ? ? ? ?

## 2021-06-10 NOTE — ED Notes (Signed)
Dr. Scot Dock  CVTS at [facility] ?

## 2021-06-10 NOTE — Hospital Course (Addendum)
#  Acute Partial Left Internal Jugular Thrombosis ?#Chronic Chest Pain ?Patient presented with left neck and arm swelling along with left-sided chest pain. Given the chest pain, ACS was considered but ruled out with negative troponins and normal EKG.  Heart score was calculated and placed patient at moderate risk with 12-16% chance of 6 week mortality. It does appears his chest pains are chronic.  CTA performed showed thrombus in the internal jugular vein and revealed no acute infectious or inflammatory process of the submandibular or other neck spaces. Etiology of thrombus formation unclear but likely secondary to stenosis of the area. Lemierre syndrome was considered due to recent oral procedure/dental work, although it is less likely given no signs of infection (no signs of cellulitis, no leukocytosis, patient is afebrile). Vascular surgery and cardiology were consulted for this patient.  Patient was started on heparin and no procedure recommended by vascular surgery (recommend outpatient follow up). Will transition to Eliquis x 3 months and he will require follow up with vascular surgery for repeat imaging to determine if further anticoagulation is needed or any sort of intervention.  ? ?#ESRD on HD ?Patient has history of ESRD and is on HD T/Th/Sat.  He did not have HD on the Thursday he was admitted. Fistula is patent in LUE with palpable thrill. He had HD on Friday (off schedule) and will resume his usual schedule at discharge. ? ?#Hypertension ?Chronic. Meds include Norvasc 5 mg, hydralazine 75 mg 3 times daily, Coreg 25 mg twice daily. Norvasc was increased to 10 mg daily.  ?  ?#Combined heart failure with recovered EF ?TEE shows EF 60-65% in 10/2020. Homes meds include his antihypertensives. Euvolemic on exam and the HF team was consulted.  ?  ?#COPD ?Chronic. Patient on albuterol, Stiolto,  and Evolocumab q2 weeks. Satting well on RA. ? ?#Hyperlipidemia ?#Hx of CVA ?Chronic.  On home Lipitor 80 mg every  other day.  Patient on aspirin 81 mg daily. Lipid panel with LDL of 47 and HDL 48.  ?

## 2021-06-10 NOTE — ED Provider Notes (Signed)
5:11 PM ?Care assumed from Dr. Vanita Panda.  At time of transfer care, patient is awaiting for admission for further work-up and management in regards to the chest pain with new discovery of internal jugular occlusion.  Vascular surgery is going to see the patient and cardiology already saw the patient and agree with heparin and admission to medicine.  Patient does take dialysis as well. ? ?Vascular surgery also came and saw the patient.  They will continue to follow and agree with anticoagulation and further chest pain work-up.  They agree that he has a good thrill in his dialysis fistula but if that was to change they would consider other intervention but at this time no intervention needed. ? ?Still awaiting unassigned medicine admission. ?  ?[name], Andre Allegra, MD ?06/10/21 1825 ? ?

## 2021-06-10 NOTE — Consult Note (Addendum)
?  ?Advanced Heart Failure Team Consult Note ? ? ?Primary Physician: Andre Holms, NP ?PCP-Cardiologist:  Andre Champagne, MD ? ?Reason for Consultation: Chest pain ? ?HPI:   ? ?[name] is seen today for evaluation of chest pain at the request of Dr. Vanita Ewing with EM. 51 y.o. male who has a history of CAD, NSTEMI 11/2017,  smoking, CKD Stage III, HTN, AAA repair 2016 at [facility] , open repair of TAAA secondary to chronic dissection with multibranch dacron graft 12/2016, CVA 2017, loop recorder, bioprothetic AVR 2016, HLD.  ?  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  The descending thoracic aorta has dilated to 5.1 cm.  Cardiac catheterization in April 2016 demonstrated 60% first diagonal lesion.  He underwent aortic valve replacement with bovine pericardial tissue valve and reconstruction along with aortic root replacement and endovascular repair of the descending thoracic aortic aneurysm at [facility].  He has had intermittent chest discomfort since then. ?  ?Admitted in 12/02/2017 chest pain. Had NSTEMI. He had LHC with stent placed proximal ramus.  Repeat cath in 7/20 with nonobstructive disease.  ?  ?He was admitted in 9/21 with atypical chest pain.  Echo showed EF 60-65% with severe LVH, normal RV, bioprosthetic aortic valve with mean gradient 18 mmHg.  Cardiolite showed no ischemia.  ?  ?Echoes in 3/22 and 6/22 showed hyperdynamic LV with aortic valve mean gradient > 30. Therefore, TEE was done in 8/22 to assess the bioprosthetic aortic valve.  This showed EF 60-65%, severe LVH, normal RV, bioprosthetic AoV that opened with minimal restriction, mean gradient 20 mmHg in setting of anesthesia and BP control, no AI.  Suspect small/undersized aortic valve.  ?  ?Patient now has ESRD and is on dialysis.  ?  ?[name] 11/22 showed small high 1st diagonal with diffuse severe  disease, similar to prior. Otherwise, nonobstructive disease. ? ?Had infiltrated left upper arm brachial basilic fistula and underwent angioplasty left brachiocephalic vein stenosis, angioplasty left subclavian stenosis and angioplasty left basilic vein transposition. ?  ?Seen for follow-up 03/16. Noted chronic left-sided chest pain which has been occurring for years. He was seen recently at [facility] and had a CTA chest showing stable repaired thoracic aorta.  It was felt his CP may be MSK/related to prior surgeries. Amlodipine added to help with any angina component to pain. ? ?Reports worsening chest discomfort since last office visit. Pain worse with exertion and direct palpation. Also describes swelling in left neck associated with left neck and left arm pain after he had several teeth removed a few weeks ago. Presented to ED today for evaluation. Labs significant for HS troponin 48>50 (has chronic mild elevation), Hgb 9.1 (12.2 a month ago). ECG sinus rhythm 77 bpm NSSTTWC. He has been hypertensive but vitals otherwise stable.  ? ?CTA chest demonstrates incompletely thrombosed left IJ (new from CT 03/14), narrowing left innominate vein (stable from last exam, innominate vein passes between aortic endograft and bypassed proximal great vessels) with underlying chronic left subclavian venous hypertension, thoracic aortic endograft patent. Has been started on heparin gtt. ? ?CT neck soft tissue again confirmed incomplete thrombosis left IJ, no other acute inflammatory process in neck. ? ? ?Review of Systems: [y] = yes, '[ ]'$  = no  ? ?General: Weight gain '[ ]'$ ; Weight loss '[ ]'$ ; Anorexia '[ ]'$ ; Fatigue [ Y]; Fever '[ ]'$ ;  Chills '[ ]'$ ; Weakness '[ ]'$   ?Cardiac: Chest pain/pressure [Y ]; Resting SOB '[ ]'$ ; Exertional SOB '[ ]'$ ; Orthopnea '[ ]'$ ; Pedal Edema '[ ]'$ ; Palpitations '[ ]'$ ; Syncope '[ ]'$ ; Presyncope '[ ]'$ ; Paroxysmal nocturnal dyspnea'[ ]'$   ?Pulmonary: Cough '[ ]'$ ; Wheezing'[ ]'$ ; Hemoptysis'[ ]'$ ; Sputum '[ ]'$ ; Snoring '[ ]'$   ?GI: Vomiting'[ ]'$ ;  Dysphagia'[ ]'$ ; Melena'[ ]'$ ; Hematochezia '[ ]'$ ; Heartburn'[ ]'$ ; Abdominal pain '[ ]'$ ; Constipation '[ ]'$ ; Diarrhea '[ ]'$ ; BRBPR '[ ]'$   ?GU: Hematuria'[ ]'$ ; Dysuria '[ ]'$ ; Nocturia'[ ]'$   ?Vascular: Pain in legs with walking '[ ]'$ ; Pain in feet with lying flat '[ ]'$ ; Non-healing sores '[ ]'$ ; Stroke '[ ]'$ ; TIA '[ ]'$ ; Slurred speech '[ ]'$ ;  ?Neuro: Headaches'[ ]'$ ; Vertigo'[ ]'$ ; Seizures'[ ]'$ ; Paresthesias'[ ]'$ ;Blurred vision '[ ]'$ ; Diplopia '[ ]'$ ; Vision changes '[ ]'$   ?Ortho/Skin: Arthritis '[ ]'$ ; Joint pain '[ ]'$ ; Muscle pain '[ ]'$ ; Joint swelling '[ ]'$ ; Back Pain '[ ]'$ ; Rash '[ ]'$   ?Psych: Depression'[ ]'$ ; Anxiety'[ ]'$   ?Heme: Bleeding problems '[ ]'$ ; Clotting disorders '[ ]'$ ; Anemia [Y ]  ?Endocrine: Diabetes '[ ]'$ ; Thyroid dysfunction'[ ]'$  ? ?Home Medications ?Prior to Admission medications   ?Medication Sig Start Date End Date Taking? Authorizing Provider  ?acetaminophen (TYLENOL) 325 MG tablet Take 650 mg by mouth every 6 (six) hours as needed for moderate pain (for pain or headaches).   Yes [provider]  ?albuterol (VENTOLIN HFA) 108 (90 Base) MCG/ACT inhaler Inhale 1 puff into the lungs every 6 (six) hours as needed for wheezing or shortness of breath. 05/24/21  Yes [provider]  ?amitriptyline (ELAVIL) 50 MG tablet Take 50 mg by mouth at bedtime.   Yes [provider]  ?amLODipine (NORVASC) 5 MG tablet Take 1 tablet (5 mg total) by mouth daily. 05/29/21  Yes Larey Dresser, MD  ?aspirin EC 81 MG EC tablet Take 1 tablet (81 mg total) by mouth daily. 12/12/17  Yes [facility], Tami Lin, PA  ?atorvastatin (LIPITOR) 80 MG tablet Take 80 mg by mouth every other day.   Yes [provider]  ?b complex-vitamin c-folic acid (NEPHRO-VITE) 0.8 MG TABS tablet Take 1 tablet by mouth once a week.   Yes [provider]  ?carvedilol (COREG) 25 MG tablet Take 25 mg by mouth 2 (two) times daily with a meal.   Yes [provider]  ?Evolocumab (REPATHA SURECLICK) 694 MG/ML SOAJ Inject 1 Dose into the skin every 14 (fourteen) days. 03/11/21   Yes Hilty, Nadean Corwin, MD  ?hydrALAZINE (APRESOLINE) 50 MG tablet Take 1.5 tablets (75 mg total) by mouth 3 (three) times daily. 10/02/20 11/25/21 Yes [place], Maricela Bo, FNP  ?HYDROcodone-acetaminophen (NORCO) 10-325 MG tablet Take 1 tablet by mouth every 6 (six) hours as needed. 05/15/21  Yes [provider]  ?lidocaine-prilocaine (EMLA) cream Apply 1 application. topically as needed (prior to port being accessed). Use on dialysis days (Tues, Thurs and Sat)   Yes [provider]  ?Tiotropium Bromide-Olodaterol (STIOLTO RESPIMAT) 2.5-2.5 MCG/ACT AERS Inhale 2 puffs into the lungs daily. 02/28/21  Yes [place], Maricela Bo, FNP  ?VELPHORO 500 MG chewable tablet Chew 500 mg by mouth daily after supper. 01/30/21  Yes [provider]  ? ? ?Past Medical History: ?Past Medical History:  ?Diagnosis Date  ? Adenomatous colon polyp 08/2015  ? Anxiety   ? Aortic disease ([place])   ? Aortic dissection ([place])   ? a. admx 04/2014 >> L renal infarct; a/c renal failure >> b.  s/p  Bioprosthetic Bentall and total arch replacement and staged endovascular repair of descending aortic aneurysm ([facility] - Dr. [name])  ? CAD (coronary artery disease)   ? a. LHC 4/16:  oD1 60%  ? Cardiomyopathy ([place])   ? a. non-ischemic - probably related to untreated HTN and ETOH abuse - Echo 3/13 with EF 35-40% >> b. Echo 4/16: Severe LVH, EF 55-60%, moderate AI, moderate MR, mild LAE, trivial effusion, known type B dissection with communication between true and false lumens with suprasternal images suggesting dissection plane may propagate to at least left subclavian takeoff, root above aortic valve okay    ? Chronic abdominal pain   ? Chronic combined systolic and diastolic congestive heart failure ([place])   ? a. 05/2011: Adm with pulm edema/HTN urgency, EF 35-40% with diffuse hypokinesis and moderate to severe mitral regurgitation. Cardiomyopathy likely due to uncontrolled HTN and ETOH abuse - cath deferred due to renal insufficiency (felt  due to uncontrolled HTN). bJodie Echevaria MV 06/2011: EF 37% and no ischemia or infarction. c. EF 45-50% by echo 01/2012.  ? Chronic sinusitis   ? CKD (chronic kidney disease)   ? dialysis m,t,th,fri  ? DDD (degenerative

## 2021-06-10 NOTE — ED Notes (Signed)
Card PA at [facility] ?

## 2021-06-10 NOTE — ED Notes (Signed)
Updated pt and family with plan, process, and timeframe with rationale. Pending admitting MD.  ?

## 2021-06-11 ENCOUNTER — Other Ambulatory Visit (HOSPITAL_COMMUNITY): Payer: Self-pay

## 2021-06-11 DIAGNOSIS — R079 Chest pain, unspecified: Secondary | ICD-10-CM | POA: Diagnosis not present

## 2021-06-11 DIAGNOSIS — I82C13 Acute embolism and thrombosis of internal jugular vein, bilateral: Secondary | ICD-10-CM | POA: Diagnosis not present

## 2021-06-11 DIAGNOSIS — N186 End stage renal disease: Secondary | ICD-10-CM | POA: Diagnosis not present

## 2021-06-11 DIAGNOSIS — Z992 Dependence on renal dialysis: Secondary | ICD-10-CM

## 2021-06-11 DIAGNOSIS — I10 Essential (primary) hypertension: Secondary | ICD-10-CM | POA: Diagnosis not present

## 2021-06-11 DIAGNOSIS — T82868A Thrombosis of vascular prosthetic devices, implants and grafts, initial encounter: Secondary | ICD-10-CM | POA: Diagnosis not present

## 2021-06-11 DIAGNOSIS — I82622 Acute embolism and thrombosis of deep veins of left upper extremity: Secondary | ICD-10-CM | POA: Diagnosis not present

## 2021-06-11 DIAGNOSIS — I82C12 Acute embolism and thrombosis of left internal jugular vein: Secondary | ICD-10-CM | POA: Diagnosis not present

## 2021-06-11 DIAGNOSIS — I5042 Chronic combined systolic (congestive) and diastolic (congestive) heart failure: Secondary | ICD-10-CM

## 2021-06-11 LAB — CBC WITH DIFFERENTIAL/PLATELET
Abs Immature Granulocytes: 0.01 10*3/uL (ref 0.00–0.07)
Basophils Absolute: 0 10*3/uL (ref 0.0–0.1)
Basophils Relative: 1 %
Eosinophils Absolute: 0.2 10*3/uL (ref 0.0–0.5)
Eosinophils Relative: 5 %
HCT: 29 % — ABNORMAL LOW (ref 39.0–52.0)
Hemoglobin: 9.2 g/dL — ABNORMAL LOW (ref 13.0–17.0)
Immature Granulocytes: 0 %
Lymphocytes Relative: 31 %
Lymphs Abs: 1.2 10*3/uL (ref 0.7–4.0)
MCH: 32.2 pg (ref 26.0–34.0)
MCHC: 31.7 g/dL (ref 30.0–36.0)
MCV: 101.4 fL — ABNORMAL HIGH (ref 80.0–100.0)
Monocytes Absolute: 0.5 10*3/uL (ref 0.1–1.0)
Monocytes Relative: 13 %
Neutro Abs: 1.9 10*3/uL (ref 1.7–7.7)
Neutrophils Relative %: 50 %
Platelets: 133 10*3/uL — ABNORMAL LOW (ref 150–400)
RBC: 2.86 MIL/uL — ABNORMAL LOW (ref 4.22–5.81)
RDW: 15 % (ref 11.5–15.5)
WBC: 3.8 10*3/uL — ABNORMAL LOW (ref 4.0–10.5)
nRBC: 0 % (ref 0.0–0.2)

## 2021-06-11 LAB — HEPATITIS B SURFACE ANTIBODY,QUALITATIVE: Hep B S Ab: NONREACTIVE

## 2021-06-11 LAB — BASIC METABOLIC PANEL
Anion gap: 11 (ref 5–15)
BUN: 49 mg/dL — ABNORMAL HIGH (ref 6–20)
CO2: 19 mmol/L — ABNORMAL LOW (ref 22–32)
Calcium: 8.7 mg/dL — ABNORMAL LOW (ref 8.9–10.3)
Chloride: 107 mmol/L (ref 98–111)
Creatinine, Ser: 9.61 mg/dL — ABNORMAL HIGH (ref 0.61–1.24)
GFR, Estimated: 6 mL/min — ABNORMAL LOW (ref >60)
Glucose, Bld: 79 mg/dL (ref 70–99)
Potassium: 4.3 mmol/L (ref 3.5–5.1)
Sodium: 137 mmol/L (ref 135–145)

## 2021-06-11 LAB — PHOSPHORUS: Phosphorus: 4.4 mg/dL (ref 2.5–4.6)

## 2021-06-11 LAB — LIPID PANEL
Cholesterol: 107 mg/dL (ref 0–200)
HDL: 48 mg/dL (ref >40)
LDL Cholesterol: 47 mg/dL (ref 0–99)
Total CHOL/HDL Ratio: 2.2 RATIO
Triglycerides: 58 mg/dL (ref <150)
VLDL: 12 mg/dL (ref 0–40)

## 2021-06-11 LAB — HEPATITIS B SURFACE ANTIGEN: Hepatitis B Surface Ag: NONREACTIVE

## 2021-06-11 LAB — HEPARIN LEVEL (UNFRACTIONATED): Heparin Unfractionated: 0.31 IU/mL (ref 0.30–0.70)

## 2021-06-11 MED ORDER — APIXABAN 5 MG PO TABS
10.0000 mg | ORAL_TABLET | Freq: Once | ORAL | Status: AC
  Administered 2021-06-11: 10 mg via ORAL
  Filled 2021-06-11: qty 2

## 2021-06-11 MED ORDER — SODIUM CHLORIDE 0.9 % IV SOLN: 100.0000 mL | INTRAVENOUS | Status: DC | PRN

## 2021-06-11 MED ORDER — HEPARIN SODIUM (PORCINE) 1000 UNIT/ML DIALYSIS
3000.0000 [IU] | Freq: Once | INTRAMUSCULAR | Status: DC
  Filled 2021-06-11: qty 3

## 2021-06-11 MED ORDER — LIDOCAINE HCL (PF) 1 % IJ SOLN: 5.0000 mL | INTRAMUSCULAR | Status: DC | PRN

## 2021-06-11 MED ORDER — PENTAFLUOROPROP-TETRAFLUOROETH EX AERO: 1.0000 "application " | INHALATION_SPRAY | CUTANEOUS | Status: DC | PRN

## 2021-06-11 MED ORDER — AMLODIPINE BESYLATE 10 MG PO TABS: 10.0000 mg | ORAL_TABLET | Freq: Every day | ORAL | Status: DC

## 2021-06-11 MED ORDER — APIXABAN (ELIQUIS) VTE STARTER PACK (10MG AND 5MG)
ORAL_TABLET | ORAL | 0 refills | Status: DC
  Filled 2021-06-11: qty 74, 30d supply, fill #0

## 2021-06-11 MED ORDER — PNEUMOCOCCAL 20-VAL CONJ VACC 0.5 ML IM SUSY
0.5000 mL | PREFILLED_SYRINGE | INTRAMUSCULAR | Status: DC
Start: 2021-06-12 — End: 2021-06-12

## 2021-06-11 MED ORDER — AMLODIPINE BESYLATE 10 MG PO TABS
10.0000 mg | ORAL_TABLET | Freq: Every day | ORAL | 0 refills | Status: DC
  Filled 2021-06-11: qty 30, 30d supply, fill #0

## 2021-06-11 MED ORDER — AMLODIPINE BESYLATE 10 MG PO TABS: 10.0000 mg | ORAL_TABLET | Freq: Every day | ORAL | 0 refills | Status: DC

## 2021-06-11 MED ORDER — APIXABAN (ELIQUIS) VTE STARTER PACK (10MG AND 5MG): ORAL_TABLET | ORAL | 0 refills | Status: DC

## 2021-06-11 MED ORDER — CHLORHEXIDINE GLUCONATE CLOTH 2 % EX PADS: 6.0000 | MEDICATED_PAD | Freq: Every day | CUTANEOUS | Status: DC

## 2021-06-11 NOTE — Progress Notes (Signed)
C/o 10/10 left neck pain and requesting pain meds, stating "tylenol doesn't work."  Dr. Humphrey Rolls notified.  Dr. Humphrey Rolls recommends tylenol and a heat pack or K pad.  Pt declines tylenol, again stating that it won't help his pain; however, he is willing to try heat therapy.  Heat pack applied to left neck/shoulder.  Will continue to monitor.  Jodell Cipro ?

## 2021-06-11 NOTE — Progress Notes (Signed)
Pt did not receive TOC meds. Spoke with on call cardiology and it was advised to give pt a one time dose of Eliquis 10 mg before discharge and pt to start Eliquis starter pack in the morning. Provider to send prescription for Eliquis and amlodipine to [facility] as requested by patient and 30 day free day coupon card given to pt by Poonam, RN. Verified information to pt with provider on the phone and Poonam, RN at bedside.  ?

## 2021-06-11 NOTE — Progress Notes (Signed)
? ?  VASCULAR SURGERY ASSESSMENT & PLAN:  ? ?LEFT IJ PARTIALLY OCCLUSIVE DVT: He is on IV heparin.  His pain and swelling have improved.  He can be converted to a DOAC and I can follow up with him in 3 months with a follow-up duplex scan.  I have arranged that. ? ?END-STAGE RENAL DISEASE: His left upper arm fistula has been working well.  It has an excellent thrill and is not pulsatile.  The CT of the chest shows he appears to have good flow through the left brachiocephalic vein.  He had previous angioplasty of the segment.  If he develops swelling in the arm or the fistula becomes pulsatile then certainly we could consider repeat fistulogram and venoplasty.   ? ?SUBJECTIVE:  ? ?He tells me that his neck pain on the left has improved and the swelling has improved. ? ?PHYSICAL EXAM:  ? ?Vitals:  ? 06/10/21 2258 06/10/21 2300 06/10/21 2353 06/11/21 0339  ?BP:  (!) 149/78  (!) 155/87  ?Pulse:  81 81 84  ?Resp:  '16 19 17  '$ ?Temp:   98.3 ?F (36.8 ?C) 98.3 ?F (36.8 ?C)  ?TempSrc:   Oral Oral  ?SpO2: 97% 100% 96% 96%  ?Weight:   69.5 kg   ?Height:   '5\' 11"'$  (1.803 m)   ? ?Excellent thrill in his left upper arm AV fistula. ? ?LABS:  ? ?Lab Results  ?Component Value Date  ? WBC 3.8 (L) 06/11/2021  ? HGB 9.2 (L) 06/11/2021  ? HCT 29.0 (L) 06/11/2021  ? MCV 101.4 (H) 06/11/2021  ? PLT 133 (L) 06/11/2021  ? ?Lab Results  ?Component Value Date  ? CREATININE 9.61 (H) 06/11/2021  ? ?Lab Results  ?Component Value Date  ? INR 1.1 05/17/2020  ? ? ?PROBLEM LIST:   ? ?Principal Problem: ?  Internal jugular (IJ) vein thromboembolism, acute, left ([place]) ? ? ?CURRENT MEDS:  ? ? amitriptyline  50 mg Oral QHS  ? amLODipine  5 mg Oral Daily  ? arformoterol  15 mcg Nebulization BID  ? And  ? umeclidinium bromide  1 puff Inhalation Daily  ? atorvastatin  80 mg Oral QODAY  ? carvedilol  25 mg Oral BID WC  ? hydrALAZINE  75 mg Oral TID  ? multivitamin  1 tablet Oral QHS  ? nicotine  21 mg Transdermal Daily  ? [START ON 06/12/2021] pneumococcal  20-valent conjugate vaccine  0.5 mL Intramuscular Tomorrow-1000  ? sucroferric oxyhydroxide  500 mg Oral QPC supper  ? ? ?[name] ?Office: [phone] ?06/11/2021 ? ?

## 2021-06-11 NOTE — Progress Notes (Signed)
ANTICOAGULATION CONSULT NOTE ? ?Pharmacy Consult for heparin ?Indication:  L IJ thrombus ? ?Allergies  ?Allergen Reactions  ? Imdur [Isosorbide Nitrate] Other (See Comments)  ?  Headaches -- patient instructed to continue taking ?  ? Tramadol Other (See Comments)  ?  Headaches ?  ? ? ?Patient Measurements: ?Height: '5\' 11"'$  (180.3 cm) ?Weight: 69.5 kg (153 lb 3.2 oz) ?IBW/kg (Calculated) : 75.3 ?Heparin Dosing Weight: 70.5 kg  ? ?Vital Signs: ?Temp: 98.4 ?F (36.9 ?C) (03/29 0804) ?Temp Source: Oral (03/29 0804) ?BP: 170/89 (03/29 0804) ?Pulse Rate: 86 (03/29 0804) ? ?Labs: ?Recent Labs  ?  06/09/21 ?2238 06/10/21 ?0117 06/10/21 ?1240 06/10/21 ?1627 06/10/21 ?2119 06/11/21 ?1027  ?HGB 8.9*  --  9.1*  --   --  9.2*  ?HCT 28.0*  --  28.5*  --   --  29.0*  ?PLT 137*  --  129*  --   --  133*  ?HEPARINUNFRC  --   --   --   --  0.32 0.31  ?CREATININE 9.90*  --   --   --   --  9.61*  ?TROPONINIHS 48* 50*  --  27*  --   --   ? ? ? ?Estimated Creatinine Clearance: 9.2 mL/min (A) (by C-G formula based on SCr of 9.61 mg/dL (H)). ? ? ?Medical History: ?Past Medical History:  ?Diagnosis Date  ? Adenomatous colon polyp 08/2015  ? Anxiety   ? Aortic disease ([place])   ? Aortic dissection ([place])   ? a. admx 04/2014 >> L renal infarct; a/c renal failure >> b.  s/p Bioprosthetic Bentall and total arch replacement and staged endovascular repair of descending aortic aneurysm ([facility] - Dr. [name])  ? CAD (coronary artery disease)   ? a. LHC 4/16:  oD1 60%  ? Cardiomyopathy ([place])   ? a. non-ischemic - probably related to untreated HTN and ETOH abuse - Echo 3/13 with EF 35-40% >> b. Echo 4/16: Severe LVH, EF 55-60%, moderate AI, moderate MR, mild LAE, trivial effusion, known type B dissection with communication between true and false lumens with suprasternal images suggesting dissection plane may propagate to at least left subclavian takeoff, root above aortic valve okay    ? Chronic abdominal pain   ? Chronic combined systolic and diastolic  congestive heart failure ([place])   ? a. 05/2011: Adm with pulm edema/HTN urgency, EF 35-40% with diffuse hypokinesis and moderate to severe mitral regurgitation. Cardiomyopathy likely due to uncontrolled HTN and ETOH abuse - cath deferred due to renal insufficiency (felt due to uncontrolled HTN). bJodie Echevaria MV 06/2011: EF 37% and no ischemia or infarction. c. EF 45-50% by echo 01/2012.  ? Chronic sinusitis   ? CKD (chronic kidney disease)   ? dialysis m,t,th,fri  ? DDD (degenerative disc disease), lumbar   ? Depression   ? Descending thoracic aortic aneurysm   ? Dissecting aneurysm of thoracic aorta   ? ETOH abuse   ? a. Reported to have quit 05/2011.  ? Frequent headaches   ? GERD (gastroesophageal reflux disease)   ? Headache(784.0)   ? "q other day" (08/08/2013)  ? Heart murmur   ? Hemorrhoid thrombosis   ? History of echocardiogram   ? Echo 1/17:  Severe LVH, EF 55-60%, no RWMA, Gr 2 DD, AVR ok, mild to mod MR, mild LAE, mild reduced RVSF, mod RAE  ? History of medication noncompliance   ? HYPERLIPIDEMIA   ? Hypertension   ? a. Hx of HTN urgency secondary  to noncompliance. b. urinary metanephrine and catecholeamine levels normal 2013.  c. Renal art [name] 1/16:  No evidence of renal artery stenosis noted bilaterally.  ? INGUINAL HERNIA   ? Pneumonia ~ 2013  ? Renal insufficiency   ? Serrated adenoma of colon 08/2015  ? Stroke [facility])   ? Tobacco abuse   ? Valvular heart disease   ? a. Echo 05/2011: moderate to severe eccentric MR and mild to moderate AI with prolapsing left coronary cusp. b. Echo 01/2012: mild-mod AI, mild dilitation of aortic root, mild MR.;  c. Echo 1/16: Severe LVH consistent with hypertrophic cardio myopathy, EF 50%, no RWMA, mod AI, mild MR, mild RAE, dilated Ao root (40 mm);   ? ? ?Assessment: ?51 yo male presented on 06/09/2021. Pharmacy consulted to dose heparin for L IJ thrombus. CBC yesterday - Hgb 8.9, Plt 137.  ? ?Heparin level remains therapeutic at 0.31, CBC stable. Planning to transition to  apixaban soon. ? ?Goal of Therapy:  ?Heparin level 0.3-0.7 units/ml ?Monitor platelets by anticoagulation protocol: Yes ?  ?Plan:  ?Continue heparin 1250 units/h ?Daily heparin level and CBC ? ? ?Arrie Senate, PharmD, BCPS, BCCP ?Clinical Pharmacist ?[phone] ?Please check AMION for all [place] numbers ?06/11/2021 ? ? ? ? ?

## 2021-06-11 NOTE — TOC Benefit Eligibility Note (Signed)
Patient Advocate Encounter ? ?Insurance verification completed.   ? ?The patient is currently admitted and upon discharge could be taking Eliquis 5 mg. ? ?The current 30 day co-pay is, $0.00.  ? ?The patient is insured through [employer] Medicare Part D  ? ? ? ?Antionio Negron, CPhT ?Pharmacy Patient Advocate Specialist ?[facility] Patient Advocate Team ?Direct Number: [phone]  Fax: [phone] ? ? ? ? ? ?  ?

## 2021-06-11 NOTE — Plan of Care (Signed)
?  Problem: Clinical Measurements: ?Goal: Ability to maintain clinical measurements within normal limits will improve ?Outcome: Progressing ?Goal: Will remain free from infection ?Outcome: Progressing ?Goal: Diagnostic test results will improve ?Outcome: Progressing ?Goal: Respiratory complications will improve ?Outcome: Progressing ?Goal: Cardiovascular complication will be avoided ?Outcome: Progressing ?  ?Problem: Consults ?Goal: Pharmacy Consult for anticoagulation ?Outcome: Completed/Met ?  ?Problem: Phase I Progression Outcomes ?Goal: Voiding-avoid urinary catheter unless indicated ?Outcome: Completed/Met ?  ?

## 2021-06-11 NOTE — Progress Notes (Signed)
[name] is a 51 Y/O male with ESRD disease admitted to [facility] as observation patient 06/10/2021 with acute partial LIJ thrombosis. No HD since 06/05/2021. Will order HD today and will consult formally if patient status upgraded to in-patient.  ? ? ?HD orders: [place],S  ?4 hrs 180NRe 350/Autoflow 1.5 70.5 kg 2.0 K/2.0 Ca UF Profile 2 AVF ?-Heparin 3000 units IV TIW ?-Venofer 100 mg IV X 5 doses (Not started yet) ?-Calcitriol 2.0 mcg PO TIW ? ? ?Juanell Fairly NPC ?[place] ?[phone] ?

## 2021-06-11 NOTE — Discharge Instructions (Addendum)
Dear [name], ? ?You were hospitalized for a blood clot in a vein in your neck. We started you on an IV blood thinner while you were hospitalized, and once you go home, you will need to start Eliquis (another blood thinner) for at least 3 months. For the eliquis, you will take 10 mg twice a day for 7 days, and then after this 7 day period, you will take 5 mg twice a day. In about 3 months, you will need to see Dr. Scot Dock (the vascular surgeon) to see if you need to continue with the blood thinner or if there is any procedure they can do.  ? ?For your high blood pressure, all of your medications were resumed, however, your amlodipine/norvasc was increased from 5 to 10 mg daily. Please take this new dose and follow up with your primary care physician and cardiologist.  ? ?Continue to go to your dialysis sessions on your normal Tuesday, Thursday, Saturday schedule. Although you received dialysis today, you will need to keep your routine schedule.  ? ?If you develop any fevers, worsening chills, worsening neck swelling, or difficulty swallowing you should return to the emergency department.  ? ?Take care! ?

## 2021-06-11 NOTE — Discharge Summary (Addendum)
? ?Name: [name] ?MRN: [id number] ?DOB: [birth year] 51 y.o. ?PCP: Andre Holms, NP ? ?Date of Admission: 06/09/2021 10:05 PM ?Date of Discharge:  06/11/2021 ?Attending Physician: Dr. Angelia Mould ? ?DISCHARGE DIAGNOSIS:  ?Primary Problem: Internal jugular (IJ) vein thromboembolism, acute, left ([place])  ? ?Hospital Problems: ?Principal Problem: ?  Internal jugular (IJ) vein thromboembolism, acute, left ([place]) ?Active Problems: ?  Essential hypertension ?  COPD (chronic obstructive pulmonary disease) ([place]) ?  ESRD needing dialysis ([place]) ?  Chronic combined systolic and diastolic CHF (congestive heart failure) ([place]) ?  ? ?DISCHARGE MEDICATIONS:  ? ?Allergies as of 06/11/2021   ? ?   Reactions  ? Imdur [isosorbide Nitrate] Other (See Comments)  ? Headaches -- patient instructed to continue taking  ? Tramadol Other (See Comments)  ? Headaches  ? ?  ? ?  ?Medication List  ?  ? ?TAKE these medications   ? ?acetaminophen 325 MG tablet ?Commonly known as: TYLENOL ?Take 650 mg by mouth every 6 (six) hours as needed for moderate pain (for pain or headaches). ?  ?albuterol 108 (90 Base) MCG/ACT inhaler ?Commonly known as: VENTOLIN HFA ?Inhale 1 puff into the lungs every 6 (six) hours as needed for wheezing or shortness of breath. ?  ?amitriptyline 50 MG tablet ?Commonly known as: ELAVIL ?Take 50 mg by mouth at bedtime. ?  ?amLODipine 10 MG tablet ?Commonly known as: NORVASC ?Take 1 tablet (10 mg total) by mouth daily. ?Start taking on: June 12, 2021 ?What changed:  ?medication strength ?how much to take ?  ?aspirin 81 MG EC tablet ?Take 1 tablet (81 mg total) by mouth daily. ?  ?atorvastatin 80 MG tablet ?Commonly known as: LIPITOR ?Take 80 mg by mouth every other day. ?  ?b complex-vitamin c-folic acid 0.8 MG Tabs tablet ?Take 1 tablet by mouth once a week. ?  ?carvedilol 25 MG tablet ?Commonly known as: COREG ?Take 25 mg by mouth 2 (two) times daily with a meal. ?  ?Eliquis DVT/PE Starter Pack ?Generic drug: Apixaban Starter Pack  ('10mg'$  and '5mg'$ ) ?Take as directed on package: start with two-'5mg'$  tablets twice daily for 7 days. On day 8, switch to one-'5mg'$  tablet twice daily. ?  ?hydrALAZINE 50 MG tablet ?Commonly known as: APRESOLINE ?Take 1.5 tablets (75 mg total) by mouth 3 (three) times daily. ?  ?HYDROcodone-acetaminophen 10-325 MG tablet ?Commonly known as: NORCO ?Take 1 tablet by mouth every 6 (six) hours as needed. ?  ?lidocaine-prilocaine cream ?Commonly known as: EMLA ?Apply 1 application. topically as needed (prior to port being accessed). Use on dialysis days (Tues, Thurs and Sat) ?  ?Repatha SureClick 751 MG/ML Soaj ?Generic drug: Evolocumab ?Inject 1 Dose into the skin every 14 (fourteen) days. ?  ?Stiolto Respimat 2.5-2.5 MCG/ACT Aers ?Generic drug: Tiotropium Bromide-Olodaterol ?Inhale 2 puffs into the lungs daily. ?  ?Velphoro 500 MG chewable tablet ?Generic drug: sucroferric oxyhydroxide ?Chew 500 mg by mouth daily after supper. ?  ? ?  ? ? ?DISPOSITION AND FOLLOW-UP:  ?[name] was discharged from [facility] in Stable condition. At the hospital follow up visit please address: ? ?Left IJ vein thrombus: Started on eliquis - should take for 3 months and will need to follow up with vascular surgery (Dr. Scot Dock) at that time for repeat imaging. If swelling/pain worsens, advised to return to ED given that infectious etiology is possible, although less likely . ? ?HTN: Resumed home meds, increased norvasc to 10 mg daily. ? ?Follow-up Recommendations: ?Consults:  Cardiology, Vascular surgery ?Labs:  Renal function panel ?Studies: Duplex scan in 3 months to follow up clot ?Medications:  ?Eliquis 10 mg bid x 7 days, followed by 5 mg bid thereafter (3 month course to start) ? ?Increase amlodipine to 10 mg daily  ? ?Follow-up Appointments: ? Follow-up Information   ? ? Angelia Mould, MD Follow up on 09/08/2021.   ?Specialties: Vascular Surgery, Cardiology ?Why: call to make appointment in about 3  months ?Contact information: ?[address] ?[place] [address] ?[phone] ? ? ?  ?  ? ? Andre Holms, NP. Schedule an appointment as soon as possible for a visit.   ?Specialty: Nurse Practitioner ?Why: make appt for hospital follow up ?Contact information: ?[place] ?[place] [address] ?[phone] ? ? ?  ?  ? ? Larey Dresser, MD .   ?Specialty: Cardiology ?Contact information: ?[address] ?[address] ?[place] [address] ?[phone] ? ? ?  ?  ? ?  ?  ? ?  ? ? ?HOSPITAL COURSE:  ?Patient Summary: ?#Acute Partial Left Internal Jugular Thrombosis ?#Chronic Chest Pain ?Patient presented with left neck and arm swelling along with left-sided chest pain. Given the chest pain, ACS was considered but ruled out with negative troponins and normal EKG.  Heart score was calculated and placed patient at moderate risk with 12-16% chance of 6 week mortality. It does appears his chest pains are chronic.  CTA performed showed thrombus in the internal jugular vein and revealed no acute infectious or inflammatory process of the submandibular or other neck spaces. Etiology of thrombus formation unclear but likely secondary to stenosis of the area. Lemierre syndrome was considered due to recent oral procedure/dental work, although it is less likely given no signs of infection (no signs of cellulitis, no leukocytosis, patient is afebrile). Vascular surgery and cardiology were consulted for this patient.  Patient was started on heparin and no procedure recommended by vascular surgery (recommend outpatient follow up). Will transition to Eliquis x 3 months and he will require follow up with vascular surgery for repeat imaging to determine if further anticoagulation is needed or any sort of intervention.  ? ?#ESRD on HD ?Patient has history of ESRD and is on HD T/Th/Sat.  He did not have HD on the Thursday he was admitted. Fistula is patent in LUE with palpable thrill. He had HD on Friday (off schedule) and will  resume his usual schedule at discharge. ? ?#Hypertension ?Chronic. Meds include Norvasc 5 mg, hydralazine 75 mg 3 times daily, Coreg 25 mg twice daily. Norvasc was increased to 10 mg daily.  ?  ?#Combined heart failure with recovered EF ?TEE shows EF 60-65% in 10/2020. Homes meds include his antihypertensives. Euvolemic on exam and the HF team was consulted.  ?  ?#COPD ?Chronic. Patient on albuterol, Stiolto,  and Evolocumab q2 weeks. Satting well on RA. ? ?#Hyperlipidemia ?#Hx of CVA ?Chronic.  On home Lipitor 80 mg every other day.  Patient on aspirin 81 mg daily. Lipid panel with LDL of 47 and HDL 48.  ?  ? ?DISCHARGE INSTRUCTIONS:  ? ?Discharge Instructions   ? ? Call MD for:  difficulty breathing, headache or visual disturbances   Complete by: As directed ?  ? Call MD for:  redness, tenderness, or signs of infection (pain, swelling, redness, odor or green/yellow discharge around incision site)   Complete by: As directed ?  ? Call MD for:  severe uncontrolled pain   Complete by: As directed ?  ?  Call MD for:  temperature >100.4   Complete by: As directed ?  ? Diet - low sodium heart healthy   Complete by: As directed ?  ? Increase activity slowly   Complete by: As directed ?  ? No wound care   Complete by: As directed ?  ? ?  ? ?Dear [name], ? ?You were hospitalized for a blood clot in a vein in your neck. We started you on an IV blood thinner while you were hospitalized, and once you go home, you will need to start Eliquis (another blood thinner) for at least 3 months. For the eliquis, you will take 10 mg twice a day for 7 days, and then after this 7 day period, you will take 5 mg twice a day. In about 3 months, you will need to see Dr. Scot Dock (the vascular surgeon) to see if you need to continue with the blood thinner or if there is any procedure they can do.  ? ?For your high blood pressure, all of your medications were resumed, however, your amlodipine/norvasc was increased from 5 to 10 mg daily. Please  take this new dose and follow up with your primary care physician and cardiologist.  ? ?Continue to go to your dialysis sessions on your normal Tuesday, Thursday, Saturday schedule. Although you received dialys

## 2021-06-11 NOTE — Progress Notes (Addendum)
? ? Advanced Heart Failure Rounding Note ? ?PCP-Cardiologist: [name], MD  ? ?Subjective:   ? ?Complaining of neck discomfort. Wants something stronger than tylenol.  ? ? ? ?Objective:   ?Weight Range: ?69.5 kg ?Body mass index is 21.37 kg/m?.  ? ?Vital Signs:   ?Temp:  [98.3 ?F (36.8 ?C)-98.4 ?F (36.9 ?C)] 98.4 ?F (36.9 ?C) (03/29 0804) ?Pulse Rate:  [69-104] 86 (03/29 0804) ?Resp:  [13-29] 17 (03/29 0804) ?BP: (138-176)/(69-92) 170/89 (03/29 0804) ?SpO2:  [94 %-100 %] 94 % (03/29 0804) ?Weight:  [69.5 kg] 69.5 kg (03/28 2353) ?  ? ?Weight change: ?Filed Weights  ? 06/09/21 2216 06/10/21 2353  ?Weight: 70.5 kg 69.5 kg  ? ? ?Intake/Output:  ? ?Intake/Output Summary (Last 24 hours) at 06/11/2021 0858 ?Last data filed at 06/11/2021 0805 ?Gross per 24 hour  ?Intake 409.44 ml  ?Output 1650 ml  ?Net -1240.56 ml  ?  ? ? ?Physical Exam  ?  ?General:   No resp difficulty ?HEENT: Normal ?Neck: Supple. JVP 5-6 . Carotids 2+ bilat; no bruits. No lymphadenopathy or thyromegaly appreciated. Neck tender. ?Cor: PMI nondisplaced. Regular rate & rhythm. No rubs, gallops or murmurs. ?Lungs: Clear ?Abdomen: Soft, nontender, nondistended. No hepatosplenomegaly. No bruits or masses. Good bowel sounds. ?Extremities: No cyanosis, clubbing, rash, edema ?Neuro: Alert & orientedx3, cranial nerves grossly intact. moves all 4 extremities w/o difficulty. Affect pleasant ? ? ?Telemetry  ? ?SR 70-90s  ? ?EKG  ?  ?N/a ? ?Labs  ?  ?CBC ?Recent Labs  ?  06/09/21 ?2238 06/10/21 ?1240 06/11/21 ?0331  ?WBC 4.7 3.5* 3.8*  ?NEUTROABS 2.9  --  1.9  ?HGB 8.9* 9.1* 9.2*  ?HCT 28.0* 28.5* 29.0*  ?MCV 101.8* 101.8* 101.4*  ?PLT 137* 129* 133*  ? ?Basic Metabolic Panel ?Recent Labs  ?  06/09/21 ?2238 06/11/21 ?0331  ?NA 140 137  ?K 3.8 4.3  ?CL 107 107  ?CO2 24 19*  ?GLUCOSE 96 79  ?BUN 45* 49*  ?CREATININE 9.90* 9.61*  ?CALCIUM 9.1 8.7*  ? ?Liver Function Tests ?Recent Labs  ?  06/09/21 ?2238  ?AST 19  ?ALT 11  ?ALKPHOS 53  ?BILITOT 0.7  ?PROT 6.9   ?ALBUMIN 3.8  ? ?No results for input(s): LIPASE, AMYLASE in the last 72 hours. ?Cardiac Enzymes ?No results for input(s): CKTOTAL, CKMB, CKMBINDEX, TROPONINI in the last 72 hours. ? ?BNP: ?BNP (last 3 results) ?Recent Labs  ?  07/23/20 ?1110 05/27/21 ?1030  ?BNP 334.1* 395.4*  ? ? ?ProBNP (last 3 results) ?No results for input(s): PROBNP in the last 8760 hours. ? ? ?D-Dimer ?No results for input(s): DDIMER in the last 72 hours. ?Hemoglobin A1C ?No results for input(s): HGBA1C in the last 72 hours. ?Fasting Lipid Panel ?Recent Labs  ?  06/11/21 ?0331  ?CHOL 107  ?HDL 48  ?[place] 47  ?TRIG 58  ?CHOLHDL 2.2  ? ?Thyroid Function Tests ?No results for input(s): TSH, T4TOTAL, T3FREE, THYROIDAB in the last 72 hours. ? ?Invalid input(s): FREET3 ? ?Other results: ? ? ?Imaging  ? ? ?CT Soft Tissue Neck W Contrast ? ?Addendum Date: 06/10/2021   ?ADDENDUM REPORT: 06/10/2021 11:08 ADDENDUM: Study discussed by telephone with Dr. Carmin Muskrat on 06/10/2021 at 1055 hours. Electronically Signed   By: Genevie Ann M.D.   On: 06/10/2021 11:08  ? ?Result Date: 06/10/2021 ?CLINICAL DATA:  51 year old male with venous distension in the left neck with swelling and pain over the past 3 days. Symptom onset after dental surgery 1  month ago. EXAM: CT NECK WITH CONTRAST TECHNIQUE: Multidetector CT imaging of the neck was performed using the standard protocol following the bolus administration of intravenous contrast. RADIATION DOSE REDUCTION: This exam was performed according to the departmental dose-optimization program which includes automated exposure control, adjustment of the mA and/or kV according to patient size and/or use of iterative reconstruction technique. CONTRAST:  80m OMNIPAQUE IOHEXOL 350 MG/ML SOLN COMPARISON:  Chest radiographs 06/09/2021. CTA chest today is reported separately. Neck CT 12/27/2012. FINDINGS: Pharynx and larynx: Laryngeal and pharyngeal soft tissue contours are stable since 2014 and within normal limits. No  parapharyngeal or retropharyngeal space fluid or inflammation. Salivary glands: Sublingual space is within normal limits. Submandibular glands and parotid glands are symmetric and within normal limits. Thyroid: Negative. Lymph nodes: Negative.  No cervical lymphadenopathy. Vascular: Positive for nonocclusive thrombus in the left internal jugular vein from just above the thoracic inlet (series 3, image 70) to the level of the hyoid bone (image 58). And furthermore there appears to be focal thrombus and venous stenosis in the upper chest at the confluence of the left subclavian and internal jugular veins on series 3, image 80. However, but the left IJ remains patent. There is asymmetric enlargement of the left external jugular vein, and otherwise mild asymmetric enlargement of left retromandibular and submandibular veins. Other Major vascular structures in the neck and at the skull base are patent. Right IJ is chronically dominant (normal variant). Mild carotid atherosclerosis. Limited intracranial: Minimally included, negative. Visualized orbits: Negative. Mastoids and visualized paranasal sinuses: Chronic right lamina papyracea fracture. Mild to moderate bilateral ethmoid and maxillary sinus mucosal thickening is chronic and not significantly changed from 2014. Visible right tympanic cavity and mastoids are clear. Skeleton: Dental extraction sequelae, only residual anterior mandible dentition. Chronic right lamina papyracea fracture. No acute osseous abnormality identified. Upper chest: Reported separately. IMPRESSION: 1. Positive for Incomplete Thrombosis Of The Left Internal Jugular Vein from the confluence with the subclavian vein to the level of the hyoid bone. The left IJ remains patent, but there is asymmetric left external jugular venous distension. 2. No other acute or inflammatory process identified in the neck. Chest CTA is reported separately. Electronically Signed: By: HGenevie AnnM.D. On: 06/10/2021 10:53   ? ?CT Angio Chest Aorta W and/or Wo Contrast ? ?Result Date: 06/10/2021 ?CLINICAL DATA:  51year old male with venous distension in the left neck with swelling and pain over the past 3 days. Symptom onset after dental surgery 1 month ago. Incomplete occlusion of the left IJ on neck CT today. EXAM: CT ANGIOGRAPHY CHEST WITH CONTRAST TECHNIQUE: Multidetector CT imaging of the chest was performed using the standard protocol during bolus administration of intravenous contrast. Multiplanar CT image reconstructions and MIPs were obtained to evaluate the vascular anatomy. RADIATION DOSE REDUCTION: This exam was performed according to the departmental dose-optimization program which includes automated exposure control, adjustment of the mA and/or kV according to patient size and/or use of iterative reconstruction technique. CONTRAST:  758mOMNIPAQUE IOHEXOL 350 MG/ML SOLN in conjunction with contrast enhanced imaging of the neck reported separately. COMPARISON:  Neck CT reported separately. CTA chest dissection 05/27/2021. FINDINGS: Cardiovascular: Thrombus in the lower left IJ (series 5, image 20) as described on the neck CT today was not apparent on 05/27/2021. 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 Superimposed functional stenosis of the left innominate vein appears stable from earlier this month (innominate passes between the aortic endograft and bypassed proximal great vessels on series  5, image 55). No associated asymmetric enlargement of the left subclavian and axillary veins which is chronic to a degree. 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 This and the appearance of the proximal great vessels is stable from earlier this month. Aortic contrast timing but the central and main pulmonary arteries are enhanced and do appear to be patent. Chronic cardiomegaly.   No pericardial effusion. Mediastinum/Nodes: Postoperative changes to the mediastinum. No mediastinal mass or lymphadenopathy identified. Lungs/Pleura: Small chronic left pleural effusion is stable. Lower lun

## 2021-06-12 ENCOUNTER — Telehealth: Payer: Self-pay | Admitting: Nurse Practitioner

## 2021-06-12 ENCOUNTER — Other Ambulatory Visit (HOSPITAL_COMMUNITY): Payer: Self-pay

## 2021-06-12 LAB — HEPATITIS B SURFACE ANTIBODY, QUANTITATIVE: Hep B S AB Quant (Post): <3.1 m[IU]/mL — ABNORMAL LOW (ref >9.9)

## 2021-06-12 NOTE — Telephone Encounter (Signed)
Transition of care contact from inpatient facility ? ?Date of discharge: 06/11/2021 ?Date of contact: 06/12/2021 ?Method: Phone ?Spoke to: Patient ? ?Patient contacted to discuss transition of care from recent inpatient hospitalization. Patient was admitted to [facility] from 03/27-03/29/2023 with discharge diagnosis of thromboembolism of LIJ.  ? ?Medication changes were reviewed. ? ?Patient will follow up with his/her outpatient HD unit on: 06/12/2021. ? ? ?

## 2021-06-16 LAB — CULTURE, BLOOD (ROUTINE X 2)
Culture: NO GROWTH
Culture: NO GROWTH
Special Requests: ADEQUATE
Special Requests: ADEQUATE

## 2021-06-19 ENCOUNTER — Telehealth (HOSPITAL_COMMUNITY): Payer: Self-pay

## 2021-06-19 ENCOUNTER — Other Ambulatory Visit (HOSPITAL_COMMUNITY): Payer: Self-pay

## 2021-06-19 NOTE — Telephone Encounter (Signed)
Pharmacy Transitions of Care Follow-up Telephone Call ? ?Date of discharge: 06/11/21  ?Discharge Diagnosis: Internal jugular (IJ) vein thromboembolism ? ?How have you been since you were released from the hospital? Patient is doing OK, but is still having pain and swelling in his neck and arm. They didn't give him any pain medications in the hospital or on discharge. I advised him to follow up with his PCP regarding his pain. ? ?Medication changes made at discharge: ?START taking these medications ? ?START taking these medications  ?Apixaban Starter Pack ('10mg'$  and '5mg'$ ) ?Commonly known as: ELIQUIS STARTER PACK ?Take as directed on package: start with two-'5mg'$  tablets twice daily for 7 days. On day 8, switch to one-'5mg'$  tablet twice daily.  ? ?CHANGE how you take these medications ? ?CHANGE how you take these medications  ?amLODipine 10 MG tablet ?Commonly known as: NORVASC ?Take 1 tablet (10 mg total) by mouth daily. ?What changed:  ?medication strength ?how much to take  ? ?CONTINUE taking these medications ? ?CONTINUE taking these medications  ?acetaminophen 325 MG tablet ?Commonly known as: TYLENOL  ?albuterol 108 (90 Base) MCG/ACT inhaler ?Commonly known as: VENTOLIN HFA  ?amitriptyline 50 MG tablet ?Commonly known as: ELAVIL  ?aspirin 81 MG EC tablet ?Take 1 tablet (81 mg total) by mouth daily.  ?atorvastatin 80 MG tablet ?Commonly known as: LIPITOR  ?b complex-vitamin c-folic acid 0.8 MG Tabs tablet  ?carvedilol 25 MG tablet ?Commonly known as: COREG  ?hydrALAZINE 50 MG tablet ?Commonly known as: APRESOLINE ?Take 1.5 tablets (75 mg total) by mouth 3 (three) times daily.  ?HYDROcodone-acetaminophen 10-325 MG tablet ?Commonly known as: NORCO  ?lidocaine-prilocaine cream ?Commonly known as: EMLA  ?Repatha SureClick 026 MG/ML Soaj ?Generic drug: Evolocumab ?Inject 1 Dose into the skin every 14 (fourteen) days.  ?Stiolto Respimat 2.5-2.5 MCG/ACT Aers ?Generic drug: Tiotropium Bromide-Olodaterol ?Inhale 2 puffs into  the lungs daily.  ?Velphoro 500 MG chewable tablet ?Generic drug: sucroferric oxyhydroxide  ? ? ?Medication changes verified by the patient? yes ?  ? ?Medication Accessibility: ? ?Home Pharmacy: [facility] [address]  ? ?Was the patient provided with refills on discharged medications? Not filled at [facility]   ? ?Medication Review: ?APIXABAN (ELIQUIS)  ?Apixaban 10 mg BID initiated on 06/11/21. Will switch to apixaban 5 mg BID after 7 days (DATE 06/18/21).  ?- Discussed importance of taking medication around the same time everyday  ?- Reviewed potential DDIs with patient  ?- Advised patient of medications to avoid (NSAIDs, ASA)  ?- Educated that Tylenol (acetaminophen) will be the preferred analgesic to prevent risk of bleeding  ?- Emphasized importance of monitoring for signs and symptoms of bleeding (abnormal bruising, prolonged bleeding, nose bleeds, bleeding from gums, discolored urine, black tarry stools)  ?- Advised patient to alert all providers of anticoagulation therapy prior to starting a new medication or having a procedure  ? ?Follow-up Appointments: ?Patient has follow up on 4/11 with PCP.  ? ?If their condition worsens, is the pt aware to call PCP or go to the Emergency Dept.? yes ? ?Final Patient Assessment: ?Patient is doing OK, but is still having pain and swelling in his neck and arm. They didn't give him any pain medications in the hospital or on discharge. I advised him to follow up with his PCP regarding his pain. ? ?Darcus Austin, PharmD ?Clinical Pharmacist ?[facility] ?06/19/2021 3:20 PM  ?

## 2021-06-26 ENCOUNTER — Ambulatory Visit (HOSPITAL_COMMUNITY): Payer: Medicare Other

## 2021-07-08 ENCOUNTER — Other Ambulatory Visit: Payer: Self-pay | Admitting: Physician Assistant

## 2021-07-09 NOTE — Telephone Encounter (Signed)
Prescription refill request for Eliquis received. ?Indication:Internal jugular (IJ) vein thromboembolism ?Last office visit:05/29/21 Aundra Dubin)  ?Scr: 9.61 (06/11/21) ?Age: 51 ?Weight: 70.7kg ? ?Appropriate dose and refill sent to requested pharmacy.  ?

## 2021-07-14 ENCOUNTER — Other Ambulatory Visit: Payer: Self-pay | Admitting: Physician Assistant

## 2021-07-15 ENCOUNTER — Other Ambulatory Visit: Payer: Self-pay

## 2021-07-15 DIAGNOSIS — N186 End stage renal disease: Secondary | ICD-10-CM

## 2021-07-16 ENCOUNTER — Ambulatory Visit (HOSPITAL_COMMUNITY)
Admission: RE | Admit: 2021-07-16 | Discharge: 2021-07-16 | Disposition: A | Discharge status: Home / Self Care | Payer: Medicare Other | Source: Ambulatory Visit | Attending: Vascular Surgery | Admitting: Vascular Surgery

## 2021-07-16 DIAGNOSIS — N186 End stage renal disease: Secondary | ICD-10-CM | POA: Insufficient documentation

## 2021-07-16 DIAGNOSIS — Z992 Dependence on renal dialysis: Secondary | ICD-10-CM | POA: Insufficient documentation

## 2021-07-17 ENCOUNTER — Encounter: Payer: Self-pay | Admitting: Vascular Surgery

## 2021-07-17 ENCOUNTER — Other Ambulatory Visit: Payer: Self-pay

## 2021-07-17 ENCOUNTER — Ambulatory Visit (INDEPENDENT_AMBULATORY_CARE_PROVIDER_SITE_OTHER): Payer: Medicare Other | Admitting: Vascular Surgery

## 2021-07-17 VITALS — BP 178/93 | HR 77 | Temp 98.2°F | Resp 20 | Ht 71.0 in | Wt 157.0 lb

## 2021-07-17 DIAGNOSIS — N186 End stage renal disease: Secondary | ICD-10-CM | POA: Diagnosis not present

## 2021-07-17 DIAGNOSIS — Z992 Dependence on renal dialysis: Secondary | ICD-10-CM | POA: Diagnosis not present

## 2021-07-17 NOTE — Progress Notes (Signed)
? ? ?REASON FOR VISIT:  ? ?Left upper extremity pain which radiates to the hand.  The consult is requested by  ? ?MEDICAL ISSUES:  ? 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  He had previous venoplasty on that side.  I recommended that we proceed with a fistulogram to evaluate for any central venous stenosis that could potentially be addressed with angioplasty.  At the same time we will obtain a venogram on the right side as we may need to consider new access on the right at some point if his issues on the left progress.  He does have some mild steal based on his Doppler study but his symptoms I do not think are related to steal as they are mostly in the upper arm.  He dialyzes on Tuesdays Thursdays and Saturdays so we will proceed tomorrow which is a nondialysis day (Friday).  He was on Eliquis for left IJ DVT but stopped taking this and does not want to take this any more.  The plan was to leave him on this until end of June. ? ?HYPERTENSION: The patient's initial blood pressure today was elevated. We repeated this and this was still elevated. We have encouraged the patient to follow up with their primary care physician for management of their blood pressure. ? ? ?HPI:  ? ?[name] is a pleasant 51 y.o. male who has a left basilic vein transposition.  He presented with a poorly functioning fistula and underwent a fistulogram on 03/07/2021.  He underwent angioplasty of left brachiocephalic vein stenosis, left subclavian vein stenosis, and a stenosis within the fistula.  He did well after this.  I saw him in consult on 06/10/2021 with a left IJ DVT.  He was to be anticoagulated for 3 months.  His fistula at that time had an excellent thrill and a CT scan showed that his previously angioplastied segments of vein were opened. ? ?His chief complaint is pain in the left upper arm and shoulder and  less mobility.  He is not really complaining of pain in the hand.  He does have some numbness in his index finger on the left however. ? ?He dialyzes on Tuesdays Thursdays and Saturdays. ? ?Past Medical History:  ?Diagnosis Date  ? Adenomatous colon polyp 08/2015  ? Anxiety   ? Aortic disease ([place])   ? 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 a. admx 04/2014 >> L renal infarct; a/c renal failure >> b.  s/p Bioprosthetic Bentall and total arch replacement and staged endovascular repair of descending aortic aneurysm ([facility] - Dr. [name])  ? CAD (coronary artery disease)   ? a. LHC 4/16:  oD1 60%  ? Cardiomyopathy ([place])   ? a. non-ischemic - probably related to untreated HTN and ETOH abuse - Echo 3/13 with EF 35-40% >> b. Echo 4/16: Severe LVH, EF 55-60%, moderate AI, moderate MR, mild LAE, trivial effusion, known type B dissection with communication between true and false lumens with suprasternal images suggesting dissection plane may propagate to at least left subclavian takeoff, root above aortic valve okay    ? Chronic abdominal pain   ? Chronic combined systolic and diastolic congestive heart failure ([place])   ? a. 05/2011: Adm with pulm edema/HTN urgency, EF 35-40% with diffuse hypokinesis and moderate to severe mitral regurgitation. Cardiomyopathy likely due to uncontrolled HTN and ETOH abuse - cath deferred  due to renal insufficiency (felt due to uncontrolled HTN). bJodie Echevaria MV 06/2011: EF 37% and no ischemia or infarction. c. EF 45-50% by echo 01/2012.  ? Chronic sinusitis   ? CKD (chronic kidney disease)   ? dialysis m,t,th,fri  ? DDD (degenerative disc disease), lumbar   ? Depression   ? Descending thoracic aortic aneurysm ([place])   ? 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 ETOH abuse   ? a. Reported to have quit 05/2011.  ? Frequent headaches   ? GERD (gastroesophageal reflux disease)   ? Headache(784.0)   ? "q other day" (08/08/2013)  ? Heart murmur   ? Hemorrhoid thrombosis   ? History of echocardiogram   ? Echo 1/17:   Severe LVH, EF 55-60%, no RWMA, Gr 2 DD, AVR ok, mild to mod MR, mild LAE, mild reduced RVSF, mod RAE  ? History of medication noncompliance   ? HYPERLIPIDEMIA   ? Hypertension   ? a. Hx of HTN urgency secondary to noncompliance. b. urinary metanephrine and catecholeamine levels normal 2013.  c. Renal art [name] 1/16:  No evidence of renal artery stenosis noted bilaterally.  ? INGUINAL HERNIA   ? Pneumonia ~ 2013  ? Renal insufficiency   ? Serrated adenoma of colon 08/2015  ? Stroke [facility])   ? Tobacco abuse   ? Valvular heart disease   ? a. Echo 05/2011: moderate to severe eccentric MR and mild to moderate AI with prolapsing left coronary cusp. b. Echo 01/2012: mild-mod AI, mild dilitation of aortic root, mild MR.;  c. Echo 1/16: Severe LVH consistent with hypertrophic cardio myopathy, EF 50%, no RWMA, mod AI, mild MR, mild RAE, dilated Ao root (40 mm);   ? ? ?Family History  ?Problem Relation Age of Onset  ? Hypertension Mother   ? Hypertension Other   ? Colon cancer Paternal Uncle   ? Stroke Maternal Aunt   ? Heart attack Brother   ? Hypertension Brother   ? Diabetes Maternal Aunt   ? Lung cancer Maternal Uncle   ? Stroke Maternal Uncle   ? Other Sister   ?     "breathing machine at night"  ? Headache Sister   ? Thyroid disease Sister   ? Throat cancer Neg Hx   ? Pancreatic cancer Neg Hx   ? Esophageal cancer Neg Hx   ? Kidney disease Neg Hx   ? Liver disease Neg Hx   ? ? ?SOCIAL HISTORY: ?Social History  ? ?Tobacco Use  ? Smoking status: Some Days  ?  Packs/day: 0.25  ?  Years: 23.00  ?  Pack years: 5.75  ?  Types: Cigarettes  ? Smokeless tobacco: Never  ? Tobacco comments:  ?  3 to 4 per day  ?Substance Use Topics  ? Alcohol use: Not Currently  ?  Alcohol/week: 1.0 standard drink  ?  Types: 1 Cans of beer per week  ?  Comment: On special occasion  ? ? ?Allergies  ?Allergen Reactions  ? Imdur [Isosorbide Nitrate] Other (See Comments)  ?  Headaches -- patient instructed to continue taking ?  ? Tramadol Other (See  Comments)  ?  Headaches ?  ? ? ?Current Outpatient Medications  ?Medication Sig Dispense Refill  ? acetaminophen (TYLENOL) 325 MG tablet Take 650 mg by mouth every 6 (six) hours as needed for moderate pain (for pain or headaches).    ? albuterol (VENTOLIN HFA) 108 (90 Base) MCG/ACT inhaler Inhale 1 puff into the  lungs every 6 (six) hours as needed for wheezing or shortness of breath.    ? amitriptyline (ELAVIL) 50 MG tablet Take 50 mg by mouth at bedtime.    ? amLODipine (NORVASC) 10 MG tablet TAKE 1 TABLET(10 MG) BY MOUTH DAILY 90 tablet 3  ? aspirin EC 81 MG EC tablet Take 1 tablet (81 mg total) by mouth daily. 90 tablet 3  ? atorvastatin (LIPITOR) 80 MG tablet Take 80 mg by mouth every other day.    ? b complex-vitamin c-folic acid (NEPHRO-VITE) 0.8 MG TABS tablet Take 1 tablet by mouth once a week.    ? carvedilol (COREG) 25 MG tablet Take 25 mg by mouth 2 (two) times daily with a meal.    ? Evolocumab (REPATHA SURECLICK) 295 MG/ML SOAJ Inject 1 Dose into the skin every 14 (fourteen) days. 2 mL 11  ? hydrALAZINE (APRESOLINE) 50 MG tablet Take 1.5 tablets (75 mg total) by mouth 3 (three) times daily. 135 tablet 1  ? HYDROcodone-acetaminophen (NORCO) 10-325 MG tablet Take 1 tablet by mouth every 6 (six) hours as needed.    ? lidocaine-prilocaine (EMLA) cream Apply 1 application. topically as needed (prior to port being accessed). Use on dialysis days (Tues, Thurs and Sat)    ? Tiotropium Bromide-Olodaterol (STIOLTO RESPIMAT) 2.5-2.5 MCG/ACT AERS Inhale 2 puffs into the lungs daily. 4 g 0  ? VELPHORO 500 MG chewable tablet Chew 500 mg by mouth daily after supper.    ? apixaban (ELIQUIS) 5 MG TABS tablet Take 1 tablet (5 mg total) by mouth 2 (two) times daily. (Patient not taking: Reported on 07/17/2021) 60 tablet 2  ? ?No current facility-administered medications for this visit.  ? ? ?REVIEW OF SYSTEMS:  ?'[X]'$  denotes positive finding, '[ ]'$  denotes negative finding ?Cardiac  Comments:  ?Chest pain or chest pressure:     ?Shortness of breath upon exertion:    ?Short of breath when lying flat:    ?Irregular heart rhythm:    ?    ?Vascular    ?Pain in calf, thigh, or hip brought on by ambulation:    ?Pain in feet at night th

## 2021-07-17 NOTE — H&P (View-Only) (Signed)
? ? ?REASON FOR VISIT:  ? ?Left upper extremity pain which radiates to the hand.  The consult is requested by  ? ?MEDICAL ISSUES:  ? 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  He had previous venoplasty on that side.  I recommended that we proceed with a fistulogram to evaluate for any central venous stenosis that could potentially be addressed with angioplasty.  At the same time we will obtain a venogram on the right side as we may need to consider new access on the right at some point if his issues on the left progress.  He does have some mild steal based on his Doppler study but his symptoms I do not think are related to steal as they are mostly in the upper arm.  He dialyzes on Tuesdays Thursdays and Saturdays so we will proceed tomorrow which is a nondialysis day (Friday).  He was on Eliquis for left IJ DVT but stopped taking this and does not want to take this any more.  The plan was to leave him on this until end of June. ? ?HYPERTENSION: The patient's initial blood pressure today was elevated. We repeated this and this was still elevated. We have encouraged the patient to follow up with their primary care physician for management of their blood pressure. ? ? ?HPI:  ? ?[name] is a pleasant 51 y.o. male who has a left basilic vein transposition.  He presented with a poorly functioning fistula and underwent a fistulogram on 03/07/2021.  He underwent angioplasty of left brachiocephalic vein stenosis, left subclavian vein stenosis, and a stenosis within the fistula.  He did well after this.  I saw him in consult on 06/10/2021 with a left IJ DVT.  He was to be anticoagulated for 3 months.  His fistula at that time had an excellent thrill and a CT scan showed that his previously angioplastied segments of vein were opened. ? ?His chief complaint is pain in the left upper arm and shoulder and  less mobility.  He is not really complaining of pain in the hand.  He does have some numbness in his index finger on the left however. ? ?He dialyzes on Tuesdays Thursdays and Saturdays. ? ?Past Medical History:  ?Diagnosis Date  ? Adenomatous colon polyp 08/2015  ? Anxiety   ? Aortic disease ([place])   ? 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 a. admx 04/2014 >> L renal infarct; a/c renal failure >> b.  s/p Bioprosthetic Bentall and total arch replacement and staged endovascular repair of descending aortic aneurysm ([facility] - Dr. [name])  ? CAD (coronary artery disease)   ? a. LHC 4/16:  oD1 60%  ? Cardiomyopathy ([place])   ? a. non-ischemic - probably related to untreated HTN and ETOH abuse - Echo 3/13 with EF 35-40% >> b. Echo 4/16: Severe LVH, EF 55-60%, moderate AI, moderate MR, mild LAE, trivial effusion, known type B dissection with communication between true and false lumens with suprasternal images suggesting dissection plane may propagate to at least left subclavian takeoff, root above aortic valve okay    ? Chronic abdominal pain   ? Chronic combined systolic and diastolic congestive heart failure ([place])   ? a. 05/2011: Adm with pulm edema/HTN urgency, EF 35-40% with diffuse hypokinesis and moderate to severe mitral regurgitation. Cardiomyopathy likely due to uncontrolled HTN and ETOH abuse - cath deferred  due to renal insufficiency (felt due to uncontrolled HTN). bJodie Echevaria MV 06/2011: EF 37% and no ischemia or infarction. c. EF 45-50% by echo 01/2012.  ? Chronic sinusitis   ? CKD (chronic kidney disease)   ? dialysis m,t,th,fri  ? DDD (degenerative disc disease), lumbar   ? Depression   ? Descending thoracic aortic aneurysm ([place])   ? 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 ETOH abuse   ? a. Reported to have quit 05/2011.  ? Frequent headaches   ? GERD (gastroesophageal reflux disease)   ? Headache(784.0)   ? "q other day" (08/08/2013)  ? Heart murmur   ? Hemorrhoid thrombosis   ? History of echocardiogram   ? Echo 1/17:   Severe LVH, EF 55-60%, no RWMA, Gr 2 DD, AVR ok, mild to mod MR, mild LAE, mild reduced RVSF, mod RAE  ? History of medication noncompliance   ? HYPERLIPIDEMIA   ? Hypertension   ? a. Hx of HTN urgency secondary to noncompliance. b. urinary metanephrine and catecholeamine levels normal 2013.  c. Renal art [name] 1/16:  No evidence of renal artery stenosis noted bilaterally.  ? INGUINAL HERNIA   ? Pneumonia ~ 2013  ? Renal insufficiency   ? Serrated adenoma of colon 08/2015  ? Stroke [facility])   ? Tobacco abuse   ? Valvular heart disease   ? a. Echo 05/2011: moderate to severe eccentric MR and mild to moderate AI with prolapsing left coronary cusp. b. Echo 01/2012: mild-mod AI, mild dilitation of aortic root, mild MR.;  c. Echo 1/16: Severe LVH consistent with hypertrophic cardio myopathy, EF 50%, no RWMA, mod AI, mild MR, mild RAE, dilated Ao root (40 mm);   ? ? ?Family History  ?Problem Relation Age of Onset  ? Hypertension Mother   ? Hypertension Other   ? Colon cancer Paternal Uncle   ? Stroke Maternal Aunt   ? Heart attack Brother   ? Hypertension Brother   ? Diabetes Maternal Aunt   ? Lung cancer Maternal Uncle   ? Stroke Maternal Uncle   ? Other Sister   ?     "breathing machine at night"  ? Headache Sister   ? Thyroid disease Sister   ? Throat cancer Neg Hx   ? Pancreatic cancer Neg Hx   ? Esophageal cancer Neg Hx   ? Kidney disease Neg Hx   ? Liver disease Neg Hx   ? ? ?SOCIAL HISTORY: ?Social History  ? ?Tobacco Use  ? Smoking status: Some Days  ?  Packs/day: 0.25  ?  Years: 23.00  ?  Pack years: 5.75  ?  Types: Cigarettes  ? Smokeless tobacco: Never  ? Tobacco comments:  ?  3 to 4 per day  ?Substance Use Topics  ? Alcohol use: Not Currently  ?  Alcohol/week: 1.0 standard drink  ?  Types: 1 Cans of beer per week  ?  Comment: On special occasion  ? ? ?Allergies  ?Allergen Reactions  ? Imdur [Isosorbide Nitrate] Other (See Comments)  ?  Headaches -- patient instructed to continue taking ?  ? Tramadol Other (See  Comments)  ?  Headaches ?  ? ? ?Current Outpatient Medications  ?Medication Sig Dispense Refill  ? acetaminophen (TYLENOL) 325 MG tablet Take 650 mg by mouth every 6 (six) hours as needed for moderate pain (for pain or headaches).    ? albuterol (VENTOLIN HFA) 108 (90 Base) MCG/ACT inhaler Inhale 1 puff into the  lungs every 6 (six) hours as needed for wheezing or shortness of breath.    ? amitriptyline (ELAVIL) 50 MG tablet Take 50 mg by mouth at bedtime.    ? amLODipine (NORVASC) 10 MG tablet TAKE 1 TABLET(10 MG) BY MOUTH DAILY 90 tablet 3  ? aspirin EC 81 MG EC tablet Take 1 tablet (81 mg total) by mouth daily. 90 tablet 3  ? atorvastatin (LIPITOR) 80 MG tablet Take 80 mg by mouth every other day.    ? b complex-vitamin c-folic acid (NEPHRO-VITE) 0.8 MG TABS tablet Take 1 tablet by mouth once a week.    ? carvedilol (COREG) 25 MG tablet Take 25 mg by mouth 2 (two) times daily with a meal.    ? Evolocumab (REPATHA SURECLICK) 923 MG/ML SOAJ Inject 1 Dose into the skin every 14 (fourteen) days. 2 mL 11  ? hydrALAZINE (APRESOLINE) 50 MG tablet Take 1.5 tablets (75 mg total) by mouth 3 (three) times daily. 135 tablet 1  ? HYDROcodone-acetaminophen (NORCO) 10-325 MG tablet Take 1 tablet by mouth every 6 (six) hours as needed.    ? lidocaine-prilocaine (EMLA) cream Apply 1 application. topically as needed (prior to port being accessed). Use on dialysis days (Tues, Thurs and Sat)    ? Tiotropium Bromide-Olodaterol (STIOLTO RESPIMAT) 2.5-2.5 MCG/ACT AERS Inhale 2 puffs into the lungs daily. 4 g 0  ? VELPHORO 500 MG chewable tablet Chew 500 mg by mouth daily after supper.    ? apixaban (ELIQUIS) 5 MG TABS tablet Take 1 tablet (5 mg total) by mouth 2 (two) times daily. (Patient not taking: Reported on 07/17/2021) 60 tablet 2  ? ?No current facility-administered medications for this visit.  ? ? ?REVIEW OF SYSTEMS:  ?'[X]'$  denotes positive finding, '[ ]'$  denotes negative finding ?Cardiac  Comments:  ?Chest pain or chest pressure:     ?Shortness of breath upon exertion:    ?Short of breath when lying flat:    ?Irregular heart rhythm:    ?    ?Vascular    ?Pain in calf, thigh, or hip brought on by ambulation:    ?Pain in feet at night th

## 2021-07-18 ENCOUNTER — Encounter (HOSPITAL_COMMUNITY)
Admission: RE | Disposition: A | Discharge status: Home / Self Care | Payer: Self-pay | Source: Home / Self Care | Attending: Vascular Surgery

## 2021-07-18 ENCOUNTER — Encounter (HOSPITAL_COMMUNITY): Payer: Self-pay | Admitting: Vascular Surgery

## 2021-07-18 ENCOUNTER — Ambulatory Visit (HOSPITAL_COMMUNITY)
Admission: RE | Admit: 2021-07-18 | Discharge: 2021-07-18 | Disposition: A | Discharge status: Home / Self Care | Payer: Medicare Other | Attending: Vascular Surgery | Admitting: Vascular Surgery

## 2021-07-18 DIAGNOSIS — M79622 Pain in left upper arm: Secondary | ICD-10-CM | POA: Insufficient documentation

## 2021-07-18 DIAGNOSIS — Z7901 Long term (current) use of anticoagulants: Secondary | ICD-10-CM | POA: Insufficient documentation

## 2021-07-18 DIAGNOSIS — F17210 Nicotine dependence, cigarettes, uncomplicated: Secondary | ICD-10-CM | POA: Insufficient documentation

## 2021-07-18 DIAGNOSIS — T82858A Stenosis of vascular prosthetic devices, implants and grafts, initial encounter: Secondary | ICD-10-CM | POA: Diagnosis not present

## 2021-07-18 DIAGNOSIS — I5042 Chronic combined systolic (congestive) and diastolic (congestive) heart failure: Secondary | ICD-10-CM | POA: Diagnosis not present

## 2021-07-18 DIAGNOSIS — N186 End stage renal disease: Secondary | ICD-10-CM | POA: Insufficient documentation

## 2021-07-18 DIAGNOSIS — I132 Hypertensive heart and chronic kidney disease with heart failure and with stage 5 chronic kidney disease, or end stage renal disease: Secondary | ICD-10-CM | POA: Diagnosis not present

## 2021-07-18 DIAGNOSIS — I82C12 Acute embolism and thrombosis of left internal jugular vein: Secondary | ICD-10-CM | POA: Insufficient documentation

## 2021-07-18 DIAGNOSIS — Z992 Dependence on renal dialysis: Secondary | ICD-10-CM | POA: Insufficient documentation

## 2021-07-18 HISTORY — PX: UPPER EXTREMITY VENOGRAPHY: CATH118272

## 2021-07-18 HISTORY — PX: PERIPHERAL VASCULAR BALLOON ANGIOPLASTY: CATH118281

## 2021-07-18 HISTORY — PX: A/V FISTULAGRAM: CATH118298

## 2021-07-18 LAB — POCT I-STAT, CHEM 8
BUN: 38 mg/dL — ABNORMAL HIGH (ref 6–20)
Calcium, Ion: 1.1 mmol/L — ABNORMAL LOW (ref 1.15–1.40)
Chloride: 110 mmol/L (ref 98–111)
Creatinine, Ser: 9.3 mg/dL — ABNORMAL HIGH (ref 0.61–1.24)
Glucose, Bld: 86 mg/dL (ref 70–99)
HCT: 36 % — ABNORMAL LOW (ref 39.0–52.0)
Hemoglobin: 12.2 g/dL — ABNORMAL LOW (ref 13.0–17.0)
Potassium: 4.3 mmol/L (ref 3.5–5.1)
Sodium: 141 mmol/L (ref 135–145)
TCO2: 23 mmol/L (ref 22–32)

## 2021-07-18 SURGERY — A/V FISTULAGRAM
Anesthesia: LOCAL | Laterality: Right

## 2021-07-18 MED ORDER — LIDOCAINE HCL (PF) 1 % IJ SOLN
INTRAMUSCULAR | Status: DC | PRN
  Administered 2021-07-18: 3 mL

## 2021-07-18 MED ORDER — HEPARIN SODIUM (PORCINE) 1000 UNIT/ML IJ SOLN
INTRAMUSCULAR | Status: DC | PRN
  Administered 2021-07-18: 3000 [IU] via INTRAVENOUS

## 2021-07-18 MED ORDER — LIDOCAINE HCL (PF) 1 % IJ SOLN
INTRAMUSCULAR | Status: AC
  Filled 2021-07-18: qty 30

## 2021-07-18 MED ORDER — SODIUM CHLORIDE 0.9 % IV SOLN: 250.0000 mL | INTRAVENOUS | Status: DC | PRN

## 2021-07-18 MED ORDER — MIDAZOLAM HCL 2 MG/2ML IJ SOLN
INTRAMUSCULAR | Status: AC
  Filled 2021-07-18: qty 2

## 2021-07-18 MED ORDER — FENTANYL CITRATE (PF) 100 MCG/2ML IJ SOLN
INTRAMUSCULAR | Status: AC
  Filled 2021-07-18: qty 2

## 2021-07-18 MED ORDER — SODIUM CHLORIDE 0.9% FLUSH
3.0000 mL | Freq: Two times a day (BID) | INTRAVENOUS | Status: DC
Start: 2021-07-18 — End: 2021-07-18

## 2021-07-18 MED ORDER — MIDAZOLAM HCL 2 MG/2ML IJ SOLN
INTRAMUSCULAR | Status: DC | PRN
Start: 2021-07-18 — End: 2021-07-18
  Administered 2021-07-18: 1 mg via INTRAVENOUS

## 2021-07-18 MED ORDER — HEPARIN (PORCINE) IN NACL 1000-0.9 UT/500ML-% IV SOLN
INTRAVENOUS | Status: AC
  Filled 2021-07-18: qty 500

## 2021-07-18 MED ORDER — SODIUM CHLORIDE 0.9% FLUSH: 3.0000 mL | INTRAVENOUS | Status: DC | PRN

## 2021-07-18 MED ORDER — IODIXANOL 320 MG/ML IV SOLN
INTRAVENOUS | Status: DC | PRN
Start: 2021-07-18 — End: 2021-07-18
  Administered 2021-07-18: 70 mL

## 2021-07-18 MED ORDER — HEPARIN SODIUM (PORCINE) 1000 UNIT/ML IJ SOLN
INTRAMUSCULAR | Status: AC
  Filled 2021-07-18: qty 10

## 2021-07-18 MED ORDER — FENTANYL CITRATE (PF) 100 MCG/2ML IJ SOLN
INTRAMUSCULAR | Status: DC | PRN
  Administered 2021-07-18: 50 ug via INTRAVENOUS

## 2021-07-18 MED ORDER — HEPARIN (PORCINE) IN NACL 1000-0.9 UT/500ML-% IV SOLN
INTRAVENOUS | Status: DC | PRN
  Administered 2021-07-18: 500 mL

## 2021-07-18 SURGICAL SUPPLY — 21 items
BALLN MUSTANG 10X60X75 (BALLOONS) ×4
BALLN MUSTANG 8.0X40 75 (BALLOONS) ×4
BALLOON MUSTANG 10X60X75 (BALLOONS) IMPLANT
BALLOON MUSTANG 8.0X40 75 (BALLOONS) IMPLANT
CATH ANGIO 5F BER2 65CM (CATHETERS) ×1 IMPLANT
COVER DOME SNAP 22 D (MISCELLANEOUS) ×5 IMPLANT
DEVICE TORQUE .025-.038 (MISCELLANEOUS) ×1 IMPLANT
GUIDEWIRE ANGLED .035X150CM (WIRE) ×1 IMPLANT
KIT ENCORE 26 ADVANTAGE (KITS) ×1 IMPLANT
KIT MICROPUNCTURE NIT STIFF (SHEATH) ×1 IMPLANT
KIT PV (KITS) ×5 IMPLANT
PROTECTION STATION PRESSURIZED (MISCELLANEOUS) ×4
SHEATH PINNACLE R/O II 7F 4CM (SHEATH) ×1 IMPLANT
SHEATH PROBE COVER 6X72 (BAG) ×5 IMPLANT
STATION PROTECTION PRESSURIZED (MISCELLANEOUS) ×4 IMPLANT
STOPCOCK MORSE 400PSI 3WAY (MISCELLANEOUS) ×5 IMPLANT
TRANSDUCER W/STOPCOCK (MISCELLANEOUS) ×5 IMPLANT
TRAY PV CATH (CUSTOM PROCEDURE TRAY) ×5 IMPLANT
TUBING CIL FLEX 10 FLL-RA (TUBING) ×5 IMPLANT
WIRE BENTSON .035X145CM (WIRE) ×1 IMPLANT
WIRE ROSEN-J .035X260CM (WIRE) ×1 IMPLANT

## 2021-07-18 NOTE — Progress Notes (Signed)
Pt to resume Eliquis tomorrow morning per Dr Scot Dock. ?

## 2021-07-18 NOTE — Interval H&P Note (Signed)
History and Physical Interval Note: ? ?07/18/2021 ?9:32 AM ? ?[name]  has presented today for surgery, with the diagnosis of end stage renal disease.  The various methods of treatment have been discussed with the patient and family. After consideration of risks, benefits and other options for treatment, the patient has consented to  Procedure(s): ?A/V Fistulagram (N/A) ?UPPER EXTREMITY VENOGRAPHY (N/A) as a surgical intervention.  The patient's history has been reviewed, patient examined, no change in status, stable for surgery.  I have reviewed the patient's chart and labs.  Questions were answered to the patient's satisfaction.   ? ? ?[name] ? ? ?

## 2021-07-18 NOTE — Op Note (Signed)
? ?  PATIENT: [name]      MRN: [id number] ?DOB: [birth year]    DATE OF PROCEDURE: 07/18/2021 ? ?INDICATIONS:   ? ?[name] is a 51 y.o. male who presented to the office with left shoulder pain and neck swelling.  He has a known left IJ DVT.  He had undergone previous angioplasty of the left brachiocephalic vein and subclavian vein back in December of last year.  A fistulogram was recommended and also a central venogram of the right to be sure that this was open in case he needed right arm access in the future. ? ?PROCEDURE:   ? ?Conscious sedation ?Ultrasound-guided access to the left brachial basilic fistula ?Fistulogram ?Angioplasty of left brachiocephalic vein stenosis with up to a 10 mm x 6 cm balloon inflated to 14 atm for 2 minutes ? ?SURGEON: Judeth Cornfield. Scot Dock, MD, FACS ? ?ANESTHESIA: Local with sedation ? ?EBL: Minimal ? ?TECHNIQUE: The patient was brought to the peripheral vascular lab and was sedated. The period of conscious sedation was 41 minutes.  During that time period, I was present face-to-face 100% of the time.  The patient was administered 1 mg of Versed and 50 mcg of fentanyl. The patient's heart rate, blood pressure, and oxygen saturation were monitored by the nurse continuously during the procedure. ? ?Left arm was prepped and draped in usual sterile fashion.  Under ultrasound guidance, after the skin was anesthetized, I cannulated the fistula above the antecubital level and a micropuncture sheath was introduced over a wire. A real-time image was obtained and sent to the server.  A fistulogram was then obtained to demonstrate patency of the fistula to include the central veins.  The left brachiocephalic vein was occluded.  I elected to address this with angioplasty.  The patient received 3000 units of IV heparin after the sheath was upgraded to a 7 [place] short sheath. ? ?I was able to get an angled Glidewire through the occlusion using a Berenstein catheter.  I then exchanged for a  Rosen wire.  Initially dilated with an 8 x 4 [place] balloon.  This appeared small we went up to a 10 x 6 balloon which was inflated to 14 atm for 2 minutes.  This did cause significant discomfort and I was reluctant to go much larger than this.  There did not appear to be a significant waist.  The fistula had an excellent thrill once this was deflated.  I did use a catheter to further demonstrate the brachiocephalic vein which was patent after angioplasty. ? ?Attention was then turned to the central venogram on the right.  Contrast was injected through the vein to demonstrate patency of the brachial axillary and subclavian veins and also the brachiocephalic vein and SVC. ? ?FINDINGS:  ? ?Occlusion of the left brachiocephalic vein which was addressed with angioplasty as described above with a good result. ?No other problems with the fistula. ?The central veins on the right are patent. ? ?[name], MD, FACS ?[facility] ? ?DATE OF DICTATION:   07/18/2021 ? ? ?

## 2021-07-21 ENCOUNTER — Encounter (HOSPITAL_COMMUNITY): Payer: Self-pay | Admitting: Vascular Surgery

## 2021-07-28 NOTE — Progress Notes (Signed)
?  ? ? ?Date:  07/30/2021  ? ?ID:  [name], DOB 07/28/1970, MRN [id number]   ?Provider location: [place] [facility] ?Type of Visit: Established patient  ? ?PCP:  Arthur Holms, NP  ?HF Cardiologist:  Dr. Aundra Dubin ?  ?History of Present Illness: ?[name] is a 51 y.o. 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  ?  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  The descending thoracic aorta has dilated to 5.1 cm.  Cardiac catheterization in April 2016 demonstrated 60% first diagonal lesion.  He underwent aortic valve replacement with bovine pericardial tissue valve and reconstruction along with aortic root replacement and endovascular repair of the descending thoracic aortic aneurysm at [facility].  He has had intermittent chest discomfort since then. ?  ?Admitted in 12/02/2017 chest pain. Had NSTEMI. He had LHC with stent placed proximal ramus.  Repeat cath in 7/20 with nonobstructive disease.  ? ?He was admitted in 9/21 with atypical chest pain.  Echo showed EF 60-65% with severe LVH, normal RV, bioprosthetic aortic valve with mean gradient 18 mmHg.  Cardiolite showed no ischemia.  ? ?Echoes in 3/22 and 6/22 showed hyperdynamic LV with aortic valve mean gradient > 30. Therefore, TEE was done in 8/22 to assess the bioprosthetic aortic valve.  This showed EF 60-65%, severe LVH, normal RV, bioprosthetic AoV that opened with minimal restriction, mean gradient 20 mmHg in setting of anesthesia and BP control, no AI.  Suspect small/undersized aortic valve.  ? ?Patient now has ESRD and is on dialysis.  ? ?[place] 11/22 showed small high 1st diagonal with diffuse severe  disease, similar to prior. Otherwise, nonobstructive disease. ? ?Follow up 3/23, he had chronic left-sided chest pain which has been occurring for years. He was seen at [facility] and had a CTA chest showing stable repaired thoracic aorta.  It was felt his CP may be MSK/related to prior surgeries. Amlodipine added to help with any angina component to pain. ? ?Admitted 3/23 with CP. CTA showed incompletely thrombosed left IJ (new from CT 03/14), narrowing left innominate vein (stable from last exam, innominate vein passes between aortic endograft and bypassed proximal great vessels) with underlying chronic left subclavian venous hypertension, thoracic aortic endograft patent. He was started on heparin gtt and transitioned to Eliquis x 3 months per vascular.  ? ?S/p fistulogram 5/23 and venogram on right in case he requires access on RUE. ? ?Today he returns for HF follow up. Overall feeling fine. He had rectal bleeding and stopped Eliquis, bleeding subsequently resolved. He is not SOB with activity. No issues with iHD. DeniesCP, dizziness, edema, or PND/Orthopnea. Appetite ok. No fever or chills. Taking all medications. Smoking 3-4 cigs daily. BP at HD stays 160's. ? ?ECG (personally reviewed): NSR, LVH, 89 bpm ?  ?Labs (10/19): K 4.1, creatinine 1.78, myeloma panel negative ?Labs (2/20): K 4.2, creatinine 1.75, LDL 128 ?Labs (7/21): LDL 66 ?Labs (9/21): K 4.1, creatinine 3.31, hgb 11.9 ?Labs (10/21): K 4, creatinine 2.96, hgb 12.8 ?Labs (3/22): LDL 55 ?Labs (10/22): hgb 10.6 ?Labs (11/22): K 3.8, hgb 10.6 ?Labs (3/23): BNP 395, hgb 9.4, LDL 47, HDL 48 ?  ?  Review of Systems: All systems reviewed and negative except as per HPI.  ?  ?PMH: ?1. H/o ankle fractures bilaterally ?2. GERD ?3. HTN: Negative urinary catecholeamine collection for pheochromocytoma.  ?- Renal artery dopplers (3/22): Not suggestive of significant renal artery stenosis.  ?4. ESRD: Suspect hypertensive nephropathy.  ?5. Cardiomyopathy: Most likely due to  heavy ETOH ingestion and uncontrolled HTN.   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  Lexiscan myoview (4/13): EF 37%, global hypokinesis, no ischemic or infarction.  Echo (12/14) with EF 45-50%, diffuse hypokinesis, moderate LVH, mild MR, moderate AI.  Echo (4/16) with EF 55-60%, severe LVH, mild LV dilation, moderate AI and moderate MR.   ?- Echo (9/19): EF 35-40% with diffuse hypokinesis, severe LVH, bioprosthetic aortic valve functioning normally.  ?- PYP scan (1/20): No evidence for TTR amyloidosis.  ?- Echo (9/21): EF 60-65%, severe LVH, normal RV, bioprosthetic aortic valve with mean gradient 18 mmHg.  ?- TEE (8/22): EF 60-65%, severe LVH, normal RV, bioprosthetic AoV that opened with minimal restriction, mean gradient 20 mmHg in setting of anesthesia and BP control, no AI.  Suspect small/undersized aortic valve. ?6. ETOH abuse: Quit 3/13.  ?7. Active smoker. ?8. Valvular heart disease: Echo in 3/13 showed moderate to severe eccentric MR and mild to moderate AI with prolapsing left coronary cusp. Echo 12/14 with mild MR and moderate AI.  Echo 4/16 with moderate AI and moderate MR.  ?- S/p Bentall procedure with bioprosthetic aortic valve 9/16.  ?- Echoes in 3/22 and 6/22 showed hyperdynamic LV with bioprosthetic aortic valve with mean gradient > 30.  ?- TEE (8/22): EF 60-65%, severe LVH, normal RV, bioprosthetic AoV that opened with minimal restriction, mean gradient 20 mmHg in setting of anesthesia and BP control, no AI.  Suspect small/undersized aortic valve. ?9.  CVA: Has ILR. ?10. Type B aortic dissection: Occurred in 2/16, from just distal to the left subclavian.  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    ?- 10/03/2014 First stage median sternotomy with right axillary cannulation for aortic valved conduit root replacement with coronary reconstruction  (button Bentall Procedure), 27 mm Sorin Mitroflow bovine pericardial valved conduit, total arch replacement (84m Vascutek BSomaliamultibranch arch graft with individual head vessel reimplantation. ?- Status post  open repair of an Extent III TAA secondary to chronic dissection with a # 223mVascutek "Coselli" multibranch Dacron graft with individual celiac, SMA, and bilateral renal artery implantation on 01/04/2017, [facility].  ?11. CAD: LHC (4/16) with 60% ostial D1.   ?- NSTEMI 9/19: LHC (9/19) showed 50% proximal LCx, 90% ramus => DES to ramus.  ?- LHC (7/20): D1 60% stenosis, patent ramus stent.  ?- Cardiolite (9/21): Inferior fixed defect, no ischemia.  ?- LHC (11/22): D1 95% stenosis, patient ramus stent. No interventional targets. ?12. Sleep study (10/21) with no significant OSA.  ? ?Current Outpatient Medications  ?Medication Sig Dispense Refill  ? acetaminophen (TYLENOL) 325 MG tablet Take 650 mg by mouth every 6 (six) hours as needed for moderate pain (for pain or headaches).    ? albuterol (VENTOLIN HFA) 108 (90 Base) MCG/ACT inhaler Inhale 1 puff into the lungs every 6 (six) hours as needed for wheezing or shortness of breath.    ? amitriptyline (ELAVIL) 50 MG tablet Take 50 mg by mouth at bedtime.    ? amLODipine (NORVASC) 10 MG tablet TAKE  1 TABLET(10 MG) BY MOUTH DAILY 90 tablet 3  ? apixaban (ELIQUIS) 5 MG TABS tablet Take 1 tablet (5 mg total) by mouth 2 (two) times daily. 60 tablet 2  ? aspirin EC 81 MG EC tablet Take 1 tablet (81 mg total) by mouth daily. 90 tablet 3  ? atorvastatin (LIPITOR) 80 MG tablet Take 80 mg by mouth at bedtime.    ? b complex-vitamin c-folic acid (NEPHRO-VITE) 0.8 MG TABS tablet Take 1 tablet by mouth Every Tuesday,Thursday,and Saturday with dialysis.    ? carvedilol (COREG) 25 MG tablet Take 25 mg by mouth 2 (two) times daily with a meal.    ? Evolocumab (REPATHA SURECLICK) 096 MG/ML SOAJ Inject 1 Dose into the skin every 14 (fourteen) days. 2 mL 11  ? hydrALAZINE  (APRESOLINE) 50 MG tablet Take 1.5 tablets (75 mg total) by mouth 3 (three) times daily. 135 tablet 1  ? lidocaine-prilocaine (EMLA) cream Apply 1 application. topically as needed (prior to port being accessed). Use

## 2021-07-30 ENCOUNTER — Encounter (HOSPITAL_COMMUNITY): Payer: Self-pay

## 2021-07-30 ENCOUNTER — Telehealth (HOSPITAL_COMMUNITY): Payer: Self-pay

## 2021-07-30 ENCOUNTER — Ambulatory Visit (HOSPITAL_COMMUNITY)
Admission: RE | Admit: 2021-07-30 | Discharge: 2021-07-30 | Disposition: A | Discharge status: Home / Self Care | Payer: Medicare Other | Source: Ambulatory Visit | Attending: Family Medicine | Admitting: Family Medicine

## 2021-07-30 VITALS — BP 166/82 | HR 83 | Wt 150.2 lb

## 2021-07-30 DIAGNOSIS — Z86718 Personal history of other venous thrombosis and embolism: Secondary | ICD-10-CM | POA: Insufficient documentation

## 2021-07-30 DIAGNOSIS — I5042 Chronic combined systolic (congestive) and diastolic (congestive) heart failure: Secondary | ICD-10-CM | POA: Insufficient documentation

## 2021-07-30 DIAGNOSIS — I25119 Atherosclerotic heart disease of native coronary artery with unspecified angina pectoris: Secondary | ICD-10-CM | POA: Insufficient documentation

## 2021-07-30 DIAGNOSIS — I82C12 Acute embolism and thrombosis of left internal jugular vein: Secondary | ICD-10-CM | POA: Diagnosis not present

## 2021-07-30 DIAGNOSIS — Z9889 Other specified postprocedural states: Secondary | ICD-10-CM

## 2021-07-30 DIAGNOSIS — Z992 Dependence on renal dialysis: Secondary | ICD-10-CM | POA: Insufficient documentation

## 2021-07-30 DIAGNOSIS — Z953 Presence of xenogenic heart valve: Secondary | ICD-10-CM | POA: Insufficient documentation

## 2021-07-30 DIAGNOSIS — I132 Hypertensive heart and chronic kidney disease with heart failure and with stage 5 chronic kidney disease, or end stage renal disease: Secondary | ICD-10-CM | POA: Insufficient documentation

## 2021-07-30 DIAGNOSIS — Z720 Tobacco use: Secondary | ICD-10-CM

## 2021-07-30 DIAGNOSIS — I2510 Atherosclerotic heart disease of native coronary artery without angina pectoris: Secondary | ICD-10-CM

## 2021-07-30 DIAGNOSIS — I359 Nonrheumatic aortic valve disorder, unspecified: Secondary | ICD-10-CM

## 2021-07-30 DIAGNOSIS — G8929 Other chronic pain: Secondary | ICD-10-CM | POA: Diagnosis not present

## 2021-07-30 DIAGNOSIS — N186 End stage renal disease: Secondary | ICD-10-CM

## 2021-07-30 DIAGNOSIS — I82C22 Chronic embolism and thrombosis of left internal jugular vein: Secondary | ICD-10-CM

## 2021-07-30 DIAGNOSIS — I10 Essential (primary) hypertension: Secondary | ICD-10-CM

## 2021-07-30 DIAGNOSIS — I252 Old myocardial infarction: Secondary | ICD-10-CM | POA: Insufficient documentation

## 2021-07-30 DIAGNOSIS — Z79899 Other long term (current) drug therapy: Secondary | ICD-10-CM | POA: Insufficient documentation

## 2021-07-30 DIAGNOSIS — F17210 Nicotine dependence, cigarettes, uncomplicated: Secondary | ICD-10-CM | POA: Diagnosis not present

## 2021-07-30 NOTE — Patient Instructions (Addendum)
Thank you for coming in today ? ?No medication changes  ? ?Your physician recommends that you schedule a follow-up appointment in:  ?3-4 months with Dr. Aundra Dubin ? ?At the [facility], you and your health needs are our priority. As part of our continuing mission to provide you with exceptional heart care, we have created designated Provider Care Teams. These Care Teams include your primary Cardiologist (physician) and Advanced Practice Providers (APPs- Physician Assistants and Nurse Practitioners) who all work together to provide you with the care you need, when you need it.  ? ?You may see any of the following providers on your designated Care Team at your next follow up: ?Dr Glori Bickers ?Dr [name] ?Darrick Grinder, NP ?Lyda Jester, PA ?Jessica Milford,NP ?Marlyce Huge, PA ?Audry Riles, PharmD ? ? ?Please be sure to bring in all your medications bottles to every appointment.  ? ?If you have any questions or concerns before your next appointment please send [name] a message through [place] or call our office at [phone].   ? ?TO LEAVE A MESSAGE FOR THE NURSE SELECT OPTION 2, PLEASE LEAVE A MESSAGE INCLUDING: ?YOUR NAME ?DATE OF BIRTH ?CALL BACK NUMBER ?REASON FOR CALL**this is important as we prioritize the call backs ? ?YOU WILL RECEIVE A CALL BACK THE SAME DAY AS LONG AS YOU CALL BEFORE 4:00 PM ? ?

## 2021-07-30 NOTE — Telephone Encounter (Signed)
Called to confirm/remind patient of their appointment at the [facility] on 07/30/21.  ? ?Patient reminded to bring all medications and/or complete list. ? ?Confirmed patient has transportation. Gave directions, instructed to utilize [place] parking. ? ?Confirmed appointment prior to ending call.  ? ?

## 2021-08-23 ENCOUNTER — Other Ambulatory Visit: Payer: Self-pay

## 2021-08-23 ENCOUNTER — Emergency Department (HOSPITAL_COMMUNITY): Payer: Medicare Other

## 2021-08-23 ENCOUNTER — Emergency Department (HOSPITAL_COMMUNITY)
Admission: EM | Admit: 2021-08-23 | Discharge: 2021-08-23 | Disposition: A | Discharge status: Home / Self Care | Payer: Medicare Other | Attending: Emergency Medicine | Admitting: Emergency Medicine

## 2021-08-23 DIAGNOSIS — R011 Cardiac murmur, unspecified: Secondary | ICD-10-CM | POA: Diagnosis not present

## 2021-08-23 DIAGNOSIS — Z955 Presence of coronary angioplasty implant and graft: Secondary | ICD-10-CM | POA: Insufficient documentation

## 2021-08-23 DIAGNOSIS — Z992 Dependence on renal dialysis: Secondary | ICD-10-CM | POA: Diagnosis not present

## 2021-08-23 DIAGNOSIS — F17200 Nicotine dependence, unspecified, uncomplicated: Secondary | ICD-10-CM | POA: Insufficient documentation

## 2021-08-23 DIAGNOSIS — N189 Chronic kidney disease, unspecified: Secondary | ICD-10-CM | POA: Diagnosis not present

## 2021-08-23 DIAGNOSIS — Z79899 Other long term (current) drug therapy: Secondary | ICD-10-CM | POA: Diagnosis not present

## 2021-08-23 DIAGNOSIS — I2510 Atherosclerotic heart disease of native coronary artery without angina pectoris: Secondary | ICD-10-CM | POA: Diagnosis not present

## 2021-08-23 DIAGNOSIS — I5042 Chronic combined systolic (congestive) and diastolic (congestive) heart failure: Secondary | ICD-10-CM | POA: Insufficient documentation

## 2021-08-23 DIAGNOSIS — Z7901 Long term (current) use of anticoagulants: Secondary | ICD-10-CM | POA: Insufficient documentation

## 2021-08-23 DIAGNOSIS — I130 Hypertensive heart and chronic kidney disease with heart failure and stage 1 through stage 4 chronic kidney disease, or unspecified chronic kidney disease: Secondary | ICD-10-CM | POA: Diagnosis not present

## 2021-08-23 DIAGNOSIS — M545 Low back pain, unspecified: Secondary | ICD-10-CM | POA: Insufficient documentation

## 2021-08-23 DIAGNOSIS — Z7982 Long term (current) use of aspirin: Secondary | ICD-10-CM | POA: Diagnosis not present

## 2021-08-23 DIAGNOSIS — R519 Headache, unspecified: Secondary | ICD-10-CM | POA: Diagnosis not present

## 2021-08-23 LAB — URINALYSIS, ROUTINE W REFLEX MICROSCOPIC
Bilirubin Urine: NEGATIVE
Glucose, UA: NEGATIVE mg/dL
Hgb urine dipstick: NEGATIVE
Ketones, ur: NEGATIVE mg/dL
Leukocytes,Ua: NEGATIVE
Nitrite: NEGATIVE
Protein, ur: 100 mg/dL — AB
Specific Gravity, Urine: 1.013 (ref 1.005–1.030)
pH: 7 (ref 5.0–8.0)

## 2021-08-23 MED ORDER — LIDOCAINE 5 % EX PTCH
1.0000 | MEDICATED_PATCH | CUTANEOUS | Status: DC
  Administered 2021-08-23: 1 via TRANSDERMAL
  Filled 2021-08-23: qty 1

## 2021-08-23 MED ORDER — LIDOCAINE 5 % EX PTCH: 1.0000 | MEDICATED_PATCH | CUTANEOUS | 0 refills | Status: DC

## 2021-08-23 MED ORDER — ACETAMINOPHEN 325 MG PO TABS
650.0000 mg | ORAL_TABLET | Freq: Once | ORAL | Status: AC
Start: 2021-08-23 — End: 2021-08-23
  Administered 2021-08-23: 650 mg via ORAL
  Filled 2021-08-23: qty 2

## 2021-08-23 NOTE — ED Provider Notes (Signed)
[facility] EMERGENCY DEPARTMENT Provider Note   CSN: [id number] Arrival date & time: 08/23/21  2778     History  Chief Complaint  Patient presents with   Back Pain   Headache   Abdominal Pain    [name] is a 51 y.o. male.   Back Pain Associated symptoms: headaches   Associated symptoms: no abdominal pain, no chest pain, no dysuria and no fever   Headache Associated symptoms: back pain   Associated symptoms: no abdominal pain, no cough, no ear pain, no eye pain, no fever, no seizures, no sore throat and no vomiting   Abdominal Pain Associated symptoms: no chest pain, no chills, no cough, no dysuria, no fever, no hematuria, no shortness of breath, no sore throat and no vomiting    51 year old male presents emergency department with complaints of left-sided back pain.  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  Pain began 3 days ago.  Is exacerbated with movement/laying on affected side and is relieved with rest.  He has tried taking 1 Tylenol for it which relieved pain minimally.  He also secondarily endorses a headache for 2 days.  Headache was gradual in onset and "feels similar to others."  Denies visual changes, sensitivity to light/sound, nausea/vomiting.  Pain is located in the right parietal region without radiation.  He described as dull/achy.  He is a dialysis patient and had dialysis earlier today. Denies fever, chills, night sweats, chest pain, shortness of breath, abdominal pain, N/V/D, urinary symptoms, change in bowel habits.  Denies fever, IV drug use, saddle anesthesia, bowel/bladder dysfunction, weakness, sensory deficits, known cancer diagnosis, recent steroid use, trauma.  Past Medical History:  Diagnosis Date   Adenomatous colon polyp 08/2015   Anxiety    Aortic disease ([place])    Aortic dissection (HCC)    a. admx 04/2014 >> L renal infarct; a/c renal failure >> b.  s/p Bioprosthetic [place] and  total arch replacement and staged endovascular repair of descending aortic aneurysm ([facility] - Dr. [name])   CAD (coronary artery disease)    a. LHC 4/16:  oD1 60%   Cardiomyopathy ([place])    a. non-ischemic - probably related to untreated HTN and ETOH abuse - Echo 3/13 with EF 35-40% >> b. Echo 4/16: Severe LVH, EF 55-60%, moderate AI, moderate MR, mild LAE, trivial effusion, known type B dissection with communication between true and false lumens with suprasternal images suggesting dissection plane may propagate to at least left subclavian takeoff, root above aortic valve okay     Chronic abdominal pain    Chronic combined systolic and diastolic congestive heart failure ([place])    a. 05/2011: Adm with pulm edema/HTN urgency, EF 35-40% with diffuse hypokinesis and moderate to severe mitral regurgitation. Cardiomyopathy likely due to uncontrolled HTN and ETOH abuse - cath deferred due to renal insufficiency (felt due to uncontrolled HTN). bJodie Echevaria MV 06/2011: EF 37% and no ischemia or infarction. c. EF 45-50% by echo 01/2012.   Chronic sinusitis    CKD (chronic kidney disease)    dialysis m,t,th,fri   DDD (degenerative disc disease), lumbar    Depression    Descending thoracic aortic aneurysm (HCC)    Dissecting aneurysm of thoracic aorta (HCC)    ETOH abuse    a. Reported to have quit 05/2011.   Frequent headaches    GERD (gastroesophageal reflux disease)    Headache(784.0)    "q other day" (08/08/2013)  Heart murmur    Hemorrhoid thrombosis    History of echocardiogram    Echo 1/17:  Severe LVH, EF 55-60%, no RWMA, Gr 2 DD, AVR ok, mild to mod MR, mild LAE, mild reduced RVSF, mod RAE   History of medication noncompliance    HYPERLIPIDEMIA    Hypertension    a. Hx of HTN urgency secondary to noncompliance. b. urinary metanephrine and catecholeamine levels normal 2013.  c. Renal art [name] 1/16:  No evidence of renal artery stenosis noted bilaterally.   INGUINAL HERNIA    Pneumonia ~ 2013   Renal  insufficiency    Serrated adenoma of colon 08/2015   Stroke [facility])    Tobacco abuse    Valvular heart disease    a. Echo 05/2011: moderate to severe eccentric MR and mild to moderate AI with prolapsing left coronary cusp. b. Echo 01/2012: mild-mod AI, mild dilitation of aortic root, mild MR.;  c. Echo 1/16: Severe LVH consistent with hypertrophic cardio myopathy, EF 50%, no RWMA, mod AI, mild MR, mild RAE, dilated Ao root (40 mm);    Past Surgical History:  Procedure Laterality Date   A/V FISTULAGRAM Left 03/07/2021   Procedure: A/V FISTULAGRAM;  Surgeon: Angelia Mould, MD;  Location: [place] [facility];  Service: Cardiovascular;  Laterality: Left;   A/V FISTULAGRAM Left 07/18/2021   Procedure: A/V Fistulagram;  Surgeon: Angelia Mould, MD;  Location: [place] [facility];  Service: Cardiovascular;  Laterality: Left;   ANKLE SURGERY Bilateral    Fractures bilaterally   AORTIC VALVE SURGERY  09/2014   AV FISTULA PLACEMENT Left 09/09/2020   Procedure: LEFT ARM ARTERIOVENOUS (AV) FISTULA CREATION;  Surgeon: Cherre Robins, MD;  Location: [place];  Service: Vascular;  Laterality: Left;   BASCILIC VEIN TRANSPOSITION Left 10/09/2020   Procedure: LEFT SECOND STAGE BASILIC VEIN TRANSPOSITION;  Surgeon: Cherre Robins, MD;  Location: [place];  Service: Vascular;  Laterality: Left;  PERIPHERAL NERVE BLOCK   CORONARY ANGIOGRAPHY N/A 12/06/2017   Procedure: CORONARY ANGIOGRAPHY;  Surgeon: Belva Crome, MD;  Location: [place] [facility];  Service: Cardiovascular;  Laterality: N/A;   CORONARY ANGIOGRAPHY N/A 09/26/2018   Procedure: CORONARY ANGIOGRAPHY;  Surgeon: Nelva Bush, MD;  Location: [place] [facility];  Service: Cardiovascular;  Laterality: N/A;   CORONARY STENT INTERVENTION N/A 12/10/2017   Procedure: CORONARY STENT INTERVENTION;  Surgeon: [name] Sine, MD;  Location: [place] [facility];  Service: Cardiovascular;  Laterality: N/A;   FOOT FRACTURE SURGERY Bilateral 2004-2010    "got pins in both of them"   HEMORRHOID SURGERY N/A 06/15/2015   Procedure: HEMORRHOIDECTOMY;  Surgeon: Stark Klein, MD;  Location: [place];  Service: General;  Laterality: N/A;   INGUINAL HERNIA REPAIR Right ~ [place] N/A 09/09/2020   Procedure: INSERTION OF TUNNELED DIALYSIS PALINDROME 19cm CATHETER;  Surgeon: Cherre Robins, MD;  Location: [place];  Service: Vascular;  Laterality: N/A;   LEFT HEART CATH AND CORONARY ANGIOGRAPHY N/A 02/05/2021   Procedure: LEFT HEART CATH AND CORONARY ANGIOGRAPHY;  Surgeon: Larey Dresser, MD;  Location: [place] [facility];  Service: Cardiovascular;  Laterality: N/A;   LEFT HEART CATHETERIZATION WITH CORONARY ANGIOGRAM N/A 06/21/2014   Procedure: LEFT HEART CATHETERIZATION WITH CORONARY ANGIOGRAM;  Surgeon: Larey Dresser, MD;  Location: [facility] CATH LAB;  Service: Cardiovascular;  Laterality: N/A;   LOOP RECORDER INSERTION N/A 06/19/2016   Procedure: Loop Recorder Insertion;  Surgeon: Revonda Standard  Caryl Comes, MD;  Location: [place] [facility];  Service: Cardiovascular;  Laterality: N/A;   PERIPHERAL VASCULAR BALLOON ANGIOPLASTY  03/07/2021   Procedure: PERIPHERAL VASCULAR BALLOON ANGIOPLASTY;  Surgeon: Angelia Mould, MD;  Location: [place] [facility];  Service: Cardiovascular;;   PERIPHERAL VASCULAR BALLOON ANGIOPLASTY  07/18/2021   Procedure: PERIPHERAL VASCULAR BALLOON ANGIOPLASTY;  Surgeon: Angelia Mould, MD;  Location: [place] [facility];  Service: Cardiovascular;;   TEE WITHOUT CARDIOVERSION N/A 03/12/2016   Procedure: TRANSESOPHAGEAL ECHOCARDIOGRAM (TEE);  Surgeon: Sanda Klein, MD;  Location: [facility] ENDOSCOPY;  Service: Cardiovascular;  Laterality: N/A;   TEE WITHOUT CARDIOVERSION N/A 10/16/2020   Procedure: TRANSESOPHAGEAL ECHOCARDIOGRAM (TEE);  Surgeon: Larey Dresser, MD;  Location: [facility] ENDOSCOPY;  Service: Cardiovascular;  Laterality: N/A;   UPPER EXTREMITY VENOGRAPHY Right 07/18/2021   Procedure: UPPER EXTREMITY VENOGRAPHY;   Surgeon: Angelia Mould, MD;  Location: [place] [facility];  Service: Cardiovascular;  Laterality: Right;     Home Medications Prior to Admission medications   Medication Sig Start Date End Date Taking? Authorizing Provider  lidocaine (LIDODERM) 5 % Place 1 patch onto the skin daily. Remove & Discard patch within 12 hours or as directed by MD 08/23/21  Yes Wilnette Kales, PA  acetaminophen (TYLENOL) 325 MG tablet Take 650 mg by mouth every 6 (six) hours as needed for moderate pain (for pain or headaches).    [provider]  albuterol (VENTOLIN HFA) 108 (90 Base) MCG/ACT inhaler Inhale 1 puff into the lungs every 6 (six) hours as needed for wheezing or shortness of breath. 05/24/21   [provider]  amitriptyline (ELAVIL) 50 MG tablet Take 50 mg by mouth at bedtime.    [provider]  amLODipine (NORVASC) 10 MG tablet TAKE 1 TABLET(10 MG) BY MOUTH DAILY 07/15/21   Larey Dresser, MD  apixaban (ELIQUIS) 5 MG TABS tablet Take 1 tablet (5 mg total) by mouth 2 (two) times daily. Patient not taking: Reported on 07/30/2021 07/09/21   Larey Dresser, MD  aspirin EC 81 MG EC tablet Take 1 tablet (81 mg total) by mouth daily. 12/12/17   [facility], Tami Lin, PA  atorvastatin (LIPITOR) 80 MG tablet Take 80 mg by mouth at bedtime.    [provider]  b complex-vitamin c-folic acid (NEPHRO-VITE) 0.8 MG TABS tablet Take 1 tablet by mouth Every Tuesday,Thursday,and Saturday with dialysis.    [provider]  carvedilol (COREG) 25 MG tablet Take 25 mg by mouth 2 (two) times daily with a meal.    [provider]  Evolocumab (REPATHA SURECLICK) 628 MG/ML SOAJ Inject 1 Dose into the skin every 14 (fourteen) days. 03/11/21   [place], Nadean Corwin, MD  hydrALAZINE (APRESOLINE) 50 MG tablet Take 1.5 tablets (75 mg total) by mouth 3 (three) times daily. 10/02/20 11/25/21  Rafael Bihari, FNP  lidocaine-prilocaine (EMLA) cream Apply 1 application. topically as  needed (prior to port being accessed). Use on dialysis days (Tues, Thurs and Sat)    [provider]  Tiotropium Bromide-Olodaterol (STIOLTO RESPIMAT) 2.5-2.5 MCG/ACT AERS Inhale 2 puffs into the lungs daily. Patient taking differently: Inhale 2 puffs into the lungs daily as needed (shortness of breath). 02/28/21   [place], Maricela Bo, FNP  VELPHORO 500 MG chewable tablet Chew 500 mg by mouth 3 (three) times daily with meals. 01/30/21   [provider]      Allergies    Imdur [isosorbide nitrate] and Tramadol    Review of Systems  Review of Systems  Constitutional:  Negative for chills and fever.  HENT:  Negative for ear pain and sore throat.   Eyes:  Negative for pain and visual disturbance.  Respiratory:  Negative for cough and shortness of breath.   Cardiovascular:  Negative for chest pain and palpitations.  Gastrointestinal:  Negative for abdominal pain and vomiting.  Genitourinary:  Negative for dysuria and hematuria.  Musculoskeletal:  Positive for back pain. Negative for arthralgias.  Skin:  Negative for color change and rash.  Neurological:  Positive for headaches. Negative for seizures and syncope.  All other systems reviewed and are negative.   Physical Exam Updated Vital Signs BP (!) 173/102   Pulse 81   Temp 98.7 F (37.1 C) (Oral)   Resp 16   Ht '5\' 11"'$  (1.803 m)   Wt 74.4 kg   SpO2 100%   BMI 22.87 kg/m  Physical Exam Vitals and nursing note reviewed.  Constitutional:      General: He is not in acute distress.    Appearance: He is well-developed.  HENT:     Head: Normocephalic and atraumatic.  Eyes:     Extraocular Movements: Extraocular movements intact.     Conjunctiva/sclera: Conjunctivae normal.  Cardiovascular:     Rate and Rhythm: Normal rate and regular rhythm.     Heart sounds: Murmur heard.     Comments: Systolic murmur noted right/left upper sternal border Pulmonary:     Effort: Pulmonary effort is normal. No respiratory  distress.     Breath sounds: Normal breath sounds.  Abdominal:     Palpations: Abdomen is soft.     Tenderness: There is no abdominal tenderness.  Musculoskeletal:        General: No swelling.     Cervical back: Neck supple.       Back:     Comments: Tender to palpation in area noted above.  Pain exacerbated with horizontal rotation of back as well as extension and flexion.  No CVA tenderness bilaterally.  No overlying skin abnormalities noted.  Skin:    General: Skin is warm and dry.     Capillary Refill: Capillary refill takes less than 2 seconds.  Neurological:     Mental Status: He is alert and oriented to person, place, and time.     Motor: No pronator drift.     Coordination: Romberg sign negative. Finger-Nose-Finger Test and Heel to [facility] Test normal.     Comments: Cranial 3 through 12 grossly intact.  Muscle strength 5 out of 5 upper and lower extremities.  No sensory deficits along major nerve distributions of upper or lower extremities.  Patient able to ambulate without difficulty.  Posterior tibial and radial pulses full and intact bilaterally.  Psychiatric:        Mood and Affect: Mood normal.     ED Results / Procedures / Treatments   Labs (all labs ordered are listed, but only abnormal results are displayed) Labs Reviewed  URINALYSIS, ROUTINE W REFLEX MICROSCOPIC - Abnormal; Notable for the following components:      Result Value   Protein, ur 100 (*)    Bacteria, UA RARE (*)    All other components within normal limits    EKG None  Radiology DG Chest 2 View  Result Date: 08/23/2021 CLINICAL DATA:  Left posterior chest wall pain. EXAM: CHEST - 2 VIEW COMPARISON:  06/09/2021. FINDINGS: Stable changes from cardiac surgery and placement of a long thoracic aortic stent. Heart is mildly enlarged.  No mediastinal or hilar masses or evidence of lymphadenopathy. Clear lungs.  No pleural effusion or pneumothorax. Skeletal structures are intact. IMPRESSION: No acute  cardiopulmonary disease. Electronically Signed   By: Lajean Manes M.D.   On: 08/23/2021 15:13    Procedures Procedures    Medications Ordered in ED Medications  lidocaine (LIDODERM) 5 % 1 patch (1 patch Transdermal Patch Applied 08/23/21 1438)  acetaminophen (TYLENOL) tablet 650 mg (650 mg Oral Given 08/23/21 1437)    ED Course/ Medical Decision Making/ A&P                           Medical Decision Making Amount and/or Complexity of Data Reviewed Labs: ordered. Radiology: ordered.  Risk OTC drugs. Prescription drug management.   This patient presents to the ED for concern of back pain, this involves an extensive number of treatment options, and is a complaint that carries with it a high risk of complications and morbidity.  The differential diagnosis includes The emergent differential diagnosis for back pain includes but is not limited to fracture, muscle strain, cauda equina, spinal stenosis. DDD, ankylosing spondylitis, acute ligamentous injury, disk herniation, spondylolisthesis, Epidural compression syndrome, metastatic cancer, transverse myelitis, vertebral osteomyelitis, diskitis, kidney stone, pyelonephritis, AAA, Perforated ulcer, Retrocecal appendicitis, pancreatitis, bowel obstruction, retroperitoneal hemorrhage or mass, meningitis.   Co morbidities that complicate the patient evaluation  CKD on dialysis, CAD, alcohol abuse, GERD, hyperlipidemia, hypertension, CVA, tobacco use, cardiomyopathy   Lab Tests:  I Ordered, and personally interpreted labs.  The pertinent results include: UA with rare bacteria and 100 protein.   Imaging Studies ordered:  I ordered imaging studies including chest x-ray I independently visualized and interpreted imaging which showed no acute cardiopulmonary disease I agree with the radiologist interpretation   Cardiac Monitoring: / EKG:  The patient was maintained on a cardiac monitor.  I personally viewed and interpreted the cardiac  monitored which showed an underlying rhythm of: Sinus rhythm   Consultations Obtained:  N/a  Problem List / ED Course / Critical interventions / Medication management  Back pain I ordered medication including Lidoderm patch for pain; Tylenol for pain   Reevaluation of the patient after these medicines showed that the patient improved I have reviewed the patients home medicines and have made adjustments as needed   Social Determinants of Health:  Tobacco/alcohol use   Test / Admission - Considered:  Back pain/headache Vitals signs significant for hypertension with a blood pressure 173/102.  Patient recommended close follow-up with PCP regarding hypertension. Otherwise within normal range and stable throughout visit. Laboratory/imaging studies significant for: No acute abnormality Patient symptoms likely related to lumbar strain given significant reproducibility on physical exam as well as lack of abdominal symptoms, equal pulses in all 4 extremities.  Recommend symptomatic therapy with Lidoderm patch, Tylenol, rest, ice, stretching.  Close follow-up with PCP for blood pressure as well as musculoskeletal pain recommended.  Doubt kidney stone, pyelonephritis, cauda equina, vertebral osteomyelitis, transverse myelitis.  Worrisome signs and symptoms were discussed with the patient, and the patient acknowledged understanding to return to the ED if noticed. Patient was stable upon discharge.          Final Clinical Impression(s) / ED Diagnoses Final diagnoses:  Acute left-sided low back pain without sciatica  Nonintractable headache, unspecified chronicity pattern, unspecified headache type    Rx / DC Orders ED Discharge Orders          Ordered    lidocaine (  LIDODERM) 5 %  Every 24 hours        08/23/21 1518              Wilnette Kales, [place] 08/23/21 1611    Lucrezia Starch, MD 08/24/21 [phone]

## 2021-08-23 NOTE — Discharge Instructions (Addendum)
Continue take Tylenol as needed for your headache and back pain.  Please avoid NSAIDs given your chronic kidney condition.  You can use the Lidoderm patches as needed if you find some symptomatic relief from them.  That we did not find any positive findings on your x-ray including fluid, fracture, congestion.  Consider using ice to affected area.  This may help your symptoms.  Follow-up with your PCP in 3 to 5 days if symptoms persist.  Please do not hesitate to return to the emergency department if the worrisome signs and symptoms we discussed become apparent.

## 2021-08-23 NOTE — ED Triage Notes (Signed)
Pt. Stated, [name] had a headache and left side of abdomen pain for 2 days. The nurse in dialysis said I had inflammation there

## 2021-08-23 NOTE — ED Notes (Signed)
Pt. Unable to urinate at this time. 

## 2021-09-03 ENCOUNTER — Other Ambulatory Visit: Payer: Self-pay | Admitting: *Deleted

## 2021-09-03 DIAGNOSIS — N186 End stage renal disease: Secondary | ICD-10-CM

## 2021-09-03 DIAGNOSIS — R221 Localized swelling, mass and lump, neck: Secondary | ICD-10-CM

## 2021-09-11 ENCOUNTER — Ambulatory Visit (INDEPENDENT_AMBULATORY_CARE_PROVIDER_SITE_OTHER): Payer: Medicare Other | Admitting: Vascular Surgery

## 2021-09-11 ENCOUNTER — Ambulatory Visit (HOSPITAL_COMMUNITY)
Admission: RE | Admit: 2021-09-11 | Discharge: 2021-09-11 | Disposition: A | Discharge status: Home / Self Care | Payer: Medicare Other | Source: Ambulatory Visit | Attending: Vascular Surgery | Admitting: Vascular Surgery

## 2021-09-11 ENCOUNTER — Encounter: Payer: Self-pay | Admitting: Vascular Surgery

## 2021-09-11 ENCOUNTER — Encounter (HOSPITAL_COMMUNITY): Payer: Self-pay

## 2021-09-11 VITALS — BP 147/83 | HR 75 | Temp 98.6°F | Resp 20 | Ht 71.0 in | Wt 153.7 lb

## 2021-09-11 DIAGNOSIS — N186 End stage renal disease: Secondary | ICD-10-CM | POA: Diagnosis present

## 2021-09-11 DIAGNOSIS — Z992 Dependence on renal dialysis: Secondary | ICD-10-CM | POA: Diagnosis present

## 2021-09-11 DIAGNOSIS — R221 Localized swelling, mass and lump, neck: Secondary | ICD-10-CM | POA: Insufficient documentation

## 2021-09-11 NOTE — Progress Notes (Signed)
REASON FOR VISIT:   Follow-up of hemodialysis access.  MEDICAL ISSUES:   END-STAGE RENAL DISEASE: This patient has had a couple interventions on a left brachiocephalic stenosis.  Most recently this was on 07/18/2021.  He comes in for a follow-up visit.  He dialyzes on Tuesdays Thursdays and Saturdays and says that his fistula has been working well.  He has no specific complaints.  His DVT in the left IJ has resolved.  His fistula has an excellent thrill with no evidence of outflow obstruction although he does have some collaterals around the shoulder.  There is no evidence of DVT in the left upper extremity by duplex.  We will see him back as needed.  HPI:   [name] is a pleasant 51 y.o. male who had presented to the office with left shoulder pain and neck swelling.  He had a known left IJ DVT.  He had undergone previous angioplasty of the left brachiocephalic vein and subclavian vein back in December 2022.  He was taken to the Cath Lab on 07/18/2021 and underwent a fistulogram.  He has a brachial basilic fistula.  He underwent angioplasty of the left brachiocephalic vein stenosis.  This was done with a 10 mm balloon.  He was set up for 51-monthfollow-up visit.  Since I saw him last he tells me that his fistula has been working well.  He dialyzes on Tuesdays Thursdays and Saturdays.  He denies any chest pain.  He denies any neck swelling.  Past Medical History:  Diagnosis Date   Adenomatous colon polyp 08/2015   Anxiety    Aortic disease ([place]    Aortic dissection (HCC)    a. admx 04/2014 >> L renal infarct; a/c renal failure >> b.  s/p Bioprosthetic Bentall and total arch replacement and staged endovascular repair of descending aortic aneurysm ([facility] - Dr. HYsidro Evert   CAD (coronary artery disease)    a. LHC 4/16:  oD1 60%   Cardiomyopathy ([place]    a. non-ischemic - probably related to untreated HTN and ETOH abuse - Echo 3/13 with EF 35-40% >> b. Echo 4/16: Severe LVH, EF 55-60%,  moderate AI, moderate MR, mild LAE, trivial effusion, known type B dissection with communication between true and false lumens with suprasternal images suggesting dissection plane may propagate to at least left subclavian takeoff, root above aortic valve okay     Chronic abdominal pain    Chronic combined systolic and diastolic congestive heart failure ([place]    a. 05/2011: Adm with pulm edema/HTN urgency, EF 35-40% with diffuse hypokinesis and moderate to severe mitral regurgitation. Cardiomyopathy likely due to uncontrolled HTN and ETOH abuse - cath deferred due to renal insufficiency (felt due to uncontrolled HTN). b.Jodie EchevariaMV 06/2011: EF 37% and no ischemia or infarction. c. EF 45-50% by echo 01/2012.   Chronic sinusitis    CKD (chronic kidney disease)    dialysis m,t,th,fri   DDD (degenerative disc disease), lumbar    Depression    Descending thoracic aortic aneurysm (HCC)    Dissecting aneurysm of thoracic aorta (HCC)    ETOH abuse    a. Reported to have quit 05/2011.   Frequent headaches    GERD (gastroesophageal reflux disease)    Headache(784.0)    "q other day" (08/08/2013)   Heart murmur    Hemorrhoid thrombosis    History of echocardiogram    Echo 1/17:  Severe LVH, EF 55-60%, no RWMA, Gr 2 DD, AVR ok, mild to  mod MR, mild LAE, mild reduced RVSF, mod RAE   History of medication noncompliance    HYPERLIPIDEMIA    Hypertension    a. Hx of HTN urgency secondary to noncompliance. b. urinary metanephrine and catecholeamine levels normal 2013.  c. Renal art [name] 1/16:  No evidence of renal artery stenosis noted bilaterally.   INGUINAL HERNIA    Pneumonia ~ 2013   Renal insufficiency    Serrated adenoma of colon 08/2015   Stroke [facility])    Tobacco abuse    Valvular heart disease    a. Echo 05/2011: moderate to severe eccentric MR and mild to moderate AI with prolapsing left coronary cusp. b. Echo 01/2012: mild-mod AI, mild dilitation of aortic root, mild MR.;  c. Echo 1/16: Severe LVH  consistent with hypertrophic cardio myopathy, EF 50%, no RWMA, mod AI, mild MR, mild RAE, dilated Ao root (40 mm);     Family History  Problem Relation Age of Onset   Hypertension Mother    Hypertension Other    Colon cancer Paternal Uncle    Stroke Maternal Aunt    Heart attack Brother    Hypertension Brother    Diabetes Maternal Aunt    Lung cancer Maternal Uncle    Stroke Maternal Uncle    Other Sister        "breathing machine at night"   Headache Sister    Thyroid disease Sister    Throat cancer Neg Hx    Pancreatic cancer Neg Hx    Esophageal cancer Neg Hx    Kidney disease Neg Hx    Liver disease Neg Hx     SOCIAL HISTORY: Social History   Tobacco Use   Smoking status: Some Days    Packs/day: 0.25    Years: 23.00    Total pack years: 5.75    Types: Cigarettes    Passive exposure: Current   Smokeless tobacco: Never   Tobacco comments:    3 to 4 per day  Substance Use Topics   Alcohol use: Not Currently    Alcohol/week: 1.0 standard drink of alcohol    Types: 1 Cans of beer per week    Comment: On special occasion    Allergies  Allergen Reactions   Imdur [Isosorbide Nitrate] Other (See Comments)    Headaches -- patient instructed to continue taking    Tramadol Other (See Comments)    Headaches     Current Outpatient Medications  Medication Sig Dispense Refill   acetaminophen (TYLENOL) 325 MG tablet Take 650 mg by mouth every 6 (six) hours as needed for moderate pain (for pain or headaches).     albuterol (VENTOLIN HFA) 108 (90 Base) MCG/ACT inhaler Inhale 1 puff into the lungs every 6 (six) hours as needed for wheezing or shortness of breath.     amitriptyline (ELAVIL) 50 MG tablet Take 50 mg by mouth at bedtime.     amLODipine (NORVASC) 10 MG tablet TAKE 1 TABLET(10 MG) BY MOUTH DAILY 90 tablet 3   aspirin EC 81 MG EC tablet Take 1 tablet (81 mg total) by mouth daily. 90 tablet 3   atorvastatin (LIPITOR) 80 MG tablet Take 80 mg by mouth at bedtime.      b complex-vitamin c-folic acid (NEPHRO-VITE) 0.8 MG TABS tablet Take 1 tablet by mouth Every Tuesday,Thursday,and Saturday with dialysis.     carvedilol (COREG) 25 MG tablet Take 25 mg by mouth 2 (two) times daily with a meal.     cloNIDine (  CATAPRES) 0.1 MG tablet Take 0.1 mg by mouth daily.     Evolocumab (REPATHA SURECLICK) 106 MG/ML SOAJ Inject 1 Dose into the skin every 14 (fourteen) days. 2 mL 11   hydrALAZINE (APRESOLINE) 50 MG tablet Take 1.5 tablets (75 mg total) by mouth 3 (three) times daily. 135 tablet 1   lidocaine (LIDODERM) 5 % Place 1 patch onto the skin daily. Remove & Discard patch within 12 hours or as directed by MD 30 patch 0   lidocaine-prilocaine (EMLA) cream Apply 1 application. topically as needed (prior to port being accessed). Use on dialysis days (Tues, Thurs and Sat)     Tiotropium Bromide-Olodaterol (STIOLTO RESPIMAT) 2.5-2.5 MCG/ACT AERS Inhale 2 puffs into the lungs daily. (Patient taking differently: Inhale 2 puffs into the lungs daily as needed (shortness of breath).) 4 g 0   VELPHORO 500 MG chewable tablet Chew 500 mg by mouth 3 (three) times daily with meals.     VITAMIN D, CHOLECALCIFEROL, PO Take by mouth.     apixaban (ELIQUIS) 5 MG TABS tablet Take 1 tablet (5 mg total) by mouth 2 (two) times daily. (Patient not taking: Reported on 07/30/2021) 60 tablet 2   No current facility-administered medications for this visit.    REVIEW OF SYSTEMS:  '[X]'$  denotes positive finding, '[ ]'$  denotes negative finding Cardiac  Comments:  Chest pain or chest pressure:    Shortness of breath upon exertion:    Short of breath when lying flat:    Irregular heart rhythm:        Vascular    Pain in calf, thigh, or hip brought on by ambulation:    Pain in feet at night that wakes you up from your sleep:     Blood clot in your veins:    Leg swelling:         Pulmonary    Oxygen at home:    Productive cough:     Wheezing:         Neurologic    Sudden weakness in arms  or legs:     Sudden numbness in arms or legs:     Sudden onset of difficulty speaking or slurred speech:    Temporary loss of vision in one eye:     Problems with dizziness:         Gastrointestinal    Blood in stool:     Vomited blood:         Genitourinary    Burning when urinating:     Blood in urine:        Psychiatric    Major depression:         Hematologic    Bleeding problems:    Problems with blood clotting too easily:        Skin    Rashes or ulcers:        Constitutional    Fever or chills:     PHYSICAL EXAM:   Vitals:   09/11/21 1344  BP: (!) 147/83  Pulse: 75  Resp: 20  Temp: 98.6 F (37 C)  TempSrc: Temporal  SpO2: 97%  Weight: 153 lb 11.2 oz (69.7 kg)  Height: '5\' 11"'$  (1.803 m)    GENERAL: The patient is a well-nourished male, in no acute distress. The vital signs are documented above. CARDIAC: There is a regular rate and rhythm.  VASCULAR: He has an excellent thrill in his left upper arm fistula. He does have some collaterals around the shoulder. He has  no significant left upper extremity swelling. He has a palpable left radial pulse. PULMONARY: There is good air exchange bilaterally without wheezing or rales. SKIN: There are no ulcers or rashes noted. PSYCHIATRIC: The patient has a normal affect.  DATA:    VENOUS DUPLEX: I have independently interpreted his venous duplex scan today.  This was of the left upper extremity only.  There was no evidence of DVT in the left upper extremity.  The left IJ, brachial, axillary, and subclavian veins were all patent.   [name] [facility] [phone]

## 2021-11-06 ENCOUNTER — Ambulatory Visit (HOSPITAL_COMMUNITY)
Admission: RE | Admit: 2021-11-06 | Discharge: 2021-11-06 | Disposition: A | Discharge status: Home / Self Care | Payer: Medicare Other | Source: Ambulatory Visit | Attending: Cardiology | Admitting: Cardiology

## 2021-11-06 VITALS — BP 150/88 | HR 78 | Wt 154.0 lb

## 2021-11-06 DIAGNOSIS — Z8679 Personal history of other diseases of the circulatory system: Secondary | ICD-10-CM | POA: Insufficient documentation

## 2021-11-06 DIAGNOSIS — Z8673 Personal history of transient ischemic attack (TIA), and cerebral infarction without residual deficits: Secondary | ICD-10-CM | POA: Diagnosis not present

## 2021-11-06 DIAGNOSIS — I7123 Aneurysm of the descending thoracic aorta, without rupture: Secondary | ICD-10-CM | POA: Insufficient documentation

## 2021-11-06 DIAGNOSIS — E785 Hyperlipidemia, unspecified: Secondary | ICD-10-CM | POA: Diagnosis not present

## 2021-11-06 DIAGNOSIS — I252 Old myocardial infarction: Secondary | ICD-10-CM | POA: Insufficient documentation

## 2021-11-06 DIAGNOSIS — I132 Hypertensive heart and chronic kidney disease with heart failure and with stage 5 chronic kidney disease, or end stage renal disease: Secondary | ICD-10-CM | POA: Diagnosis not present

## 2021-11-06 DIAGNOSIS — F17210 Nicotine dependence, cigarettes, uncomplicated: Secondary | ICD-10-CM | POA: Diagnosis not present

## 2021-11-06 DIAGNOSIS — Z953 Presence of xenogenic heart valve: Secondary | ICD-10-CM | POA: Diagnosis not present

## 2021-11-06 DIAGNOSIS — I5042 Chronic combined systolic (congestive) and diastolic (congestive) heart failure: Secondary | ICD-10-CM | POA: Insufficient documentation

## 2021-11-06 DIAGNOSIS — Z79899 Other long term (current) drug therapy: Secondary | ICD-10-CM | POA: Insufficient documentation

## 2021-11-06 DIAGNOSIS — I2510 Atherosclerotic heart disease of native coronary artery without angina pectoris: Secondary | ICD-10-CM | POA: Insufficient documentation

## 2021-11-06 DIAGNOSIS — N529 Male erectile dysfunction, unspecified: Secondary | ICD-10-CM | POA: Diagnosis not present

## 2021-11-06 DIAGNOSIS — I5022 Chronic systolic (congestive) heart failure: Secondary | ICD-10-CM

## 2021-11-06 MED ORDER — SILDENAFIL CITRATE 50 MG PO TABS: 50.0000 mg | ORAL_TABLET | Freq: Every day | ORAL | 6 refills | Status: DC | PRN

## 2021-11-06 MED ORDER — HYDRALAZINE HCL 100 MG PO TABS: 100.0000 mg | ORAL_TABLET | Freq: Three times a day (TID) | ORAL | 6 refills | Status: DC

## 2021-11-06 NOTE — Progress Notes (Signed)
Date:  11/06/2021   ID:  [name], DOB 1971-02-04, MRN [id number]   Provider location: [place] [facility] Type of Visit: Established patient   PCP:  Arthur Holms, NP  Cardiologist:  Dr. Aundra Dubin   History of Present Illness: [name] is a 51 y.o. male who has a history of CAD, NSTEMI 11/2017,  smoking, CKD Stage III, HTN, AAA repair 2016 at [facility] , open repair of TAAA secondary to chronic dissection with multibranch dacron graft 12/2016, CVA 2017, loop recorder, bioprothetic AVR 2016, HLD.    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  The descending thoracic aorta has dilated to 5.1 cm.  Cardiac catheterization in April 2016 demonstrated 60% first diagonal lesion.  He underwent aortic valve replacement with bovine pericardial tissue valve and reconstruction along with aortic root replacement and endovascular repair of the descending thoracic aortic aneurysm at [facility].  He has had intermittent chest discomfort since then.   Admitted in 12/02/2017 chest pain. Had NSTEMI. He had LHC with stent placed proximal ramus.  Repeat cath in 7/20 with nonobstructive disease.   He was admitted in 9/21 with atypical chest pain.  Echo showed EF 60-65% with severe LVH, normal RV, bioprosthetic aortic valve with mean gradient 18 mmHg.  Cardiolite showed no ischemia.   Echoes in 3/22 and 6/22 showed hyperdynamic LV with aortic valve mean gradient > 30. Therefore, TEE was done in 8/22 to assess the bioprosthetic aortic valve.  This showed EF 60-65%, severe LVH, normal RV, bioprosthetic AoV that opened with minimal restriction, mean gradient 20 mmHg in setting of anesthesia and BP control, no AI.  Suspect small/undersized aortic valve.   Patient now has ESRD and is on dialysis.   [place]ill 11/22 showed small high 1st diagonal with diffuse severe  disease, similar to prior. Otherwise, nonobstructive disease.  Today he returns for HF follow up of CAD and HTN. He still has atypical left-sided chest pain chronically.  He says that this has not changed for years since his aortic surgery and may be related to scar tissue.  He is still trying to quit smoking, using nicotine patches.  No significant exertional dyspnea.  No orthopnea/PND.  He feels like his heart races with exertion.  He has his grandchildren in the house and plays with them, says he gets a lot of activity this way. Tolerating HD without problems.   ECG (personally reviewed): NSR, LVH with repolarization abnormality.       Labs (10/19): K 4.1, creatinine 1.78, myeloma panel negative Labs (2/20): K 4.2, creatinine 1.75, LDL 128 Labs (7/21): LDL 66 Labs (9/21): K 4.1, creatinine 3.31, hgb 11.9 Labs (10/21): K 4, creatinine 2.96, hgb 12.8 Labs (3/22): LDL 55 Labs (10/22): hgb 10.6 Labs (11/22): K 3.8, hgb 10.6 Labs (3/23): BNP 395, hgb 9.4, LDL 47, HDL 48   Review of Systems: All systems reviewed and negative except as per HPI.    PMH: 1. H/o ankle fractures bilaterally 2. GERD 3. HTN: Negative urinary catecholeamine collection for pheochromocytoma.  - Renal artery dopplers (3/22): Not suggestive of significant renal artery stenosis.  4. ESRD: Suspect hypertensive nephropathy.  5. Cardiomyopathy: Most likely due to heavy ETOH ingestion and uncontrolled HTN.   Echo (3/13) with EF 35-40%, diffuse HK, mild LVH, moderate to severe eccentric MR, mild to moderate  AI with left coronary cusp prolapse, PA systolic pressure 38 mmHg.  Lexiscan myoview (4/13): EF 37%, global hypokinesis, no ischemic or infarction.  Echo (12/14) with EF 45-50%, diffuse hypokinesis, moderate LVH, mild MR, moderate AI.  Echo (4/16) with EF 55-60%, severe LVH, mild LV dilation, moderate AI and moderate MR.   - Echo (9/19): EF 35-40% with diffuse hypokinesis, severe LVH, bioprosthetic aortic valve functioning  normally.  - PYP scan (1/20): No evidence for TTR amyloidosis.  - Echo (9/21): EF 60-65%, severe LVH, normal RV, bioprosthetic aortic valve with mean gradient 18 mmHg.  - TEE (8/22): EF 60-65%, severe LVH, normal RV, bioprosthetic AoV that opened with minimal restriction, mean gradient 20 mmHg in setting of anesthesia and BP control, no AI.  Suspect small/undersized aortic valve. 6. ETOH abuse: Quit 3/13.  7. Active smoker. 8. Valvular heart disease: Echo in 3/13 showed moderate to severe eccentric MR and mild to moderate AI with prolapsing left coronary cusp. Echo 12/14 with mild MR and moderate AI.  Echo 4/16 with moderate AI and moderate MR.  - S/p Bentall procedure with bioprosthetic aortic valve 9/16.  - Echoes in 3/22 and 6/22 showed hyperdynamic LV with bioprosthetic aortic valve with mean gradient > 30.  - TEE (8/22): EF 60-65%, severe LVH, normal RV, bioprosthetic AoV that opened with minimal restriction, mean gradient 20 mmHg in setting of anesthesia and BP control, no AI.  Suspect small/undersized aortic valve. 9.  CVA: Has ILR. 10. Type B aortic dissection: Occurred in 2/16, from just distal to the left subclavian.  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    - 10/03/2014 First stage median sternotomy with right axillary cannulation for aortic valved conduit root replacement with coronary reconstruction (button Bentall Procedure), 27 mm Sorin Mitroflow bovine pericardial valved conduit, total arch replacement (17m Vascutek BSomaliamultibranch arch graft with individual head vessel reimplantation. - Status post  open repair of an Extent III TAA secondary to chronic dissection with a # 271mVascutek "Coselli" multibranch Dacron graft with individual celiac, SMA, and bilateral renal artery implantation on 01/04/2017, [facility].  11. CAD: LHC (4/16) with 60% ostial D1.   - NSTEMI 9/19: LHC (9/19) showed 50% proximal LCx, 90% ramus => DES to ramus.  - LHC (7/20): D1 60%  stenosis, patent ramus stent.  - Cardiolite (9/21): Inferior fixed defect, no ischemia.  - LHC (11/22): D1 95% stenosis, patient ramus stent. No interventional targets. 12. Sleep study (10/21) with no significant OSA.   Current Outpatient Medications  Medication Sig Dispense Refill   acetaminophen (TYLENOL) 325 MG tablet Take 650 mg by mouth every 6 (six) hours as needed for moderate pain (for pain or headaches).     albuterol (VENTOLIN HFA) 108 (90 Base) MCG/ACT inhaler Inhale 1 puff into the lungs every 6 (six) hours as needed for wheezing or shortness of breath.     amitriptyline (ELAVIL) 50 MG tablet Take 50 mg by mouth at bedtime.     amLODipine (NORVASC) 10 MG tablet TAKE 1 TABLET(10 MG) BY MOUTH DAILY 90 tablet 3   aspirin EC 81 MG EC tablet Take 1 tablet (81 mg total) by mouth daily. 90 tablet 3   atorvastatin (LIPITOR) 80 MG tablet Take 80 mg by mouth at bedtime.     b complex-vitamin c-folic acid (NEPHRO-VITE) 0.8 MG TABS tablet Take 1 tablet by mouth Every Tuesday,Thursday,and Saturday with dialysis.     carvedilol (COREG) 25 MG tablet Take 25 mg by mouth 2 (two)  times daily with a meal.     cloNIDine (CATAPRES) 0.1 MG tablet Take 0.1 mg by mouth daily.     Evolocumab (REPATHA SURECLICK) 542 MG/ML SOAJ Inject 1 Dose into the skin every 14 (fourteen) days. 2 mL 11   lidocaine (LIDODERM) 5 % Place 1 patch onto the skin daily. Remove & Discard patch within 12 hours or as directed by MD 30 patch 0   lidocaine-prilocaine (EMLA) cream Apply 1 application. topically as needed (prior to port being accessed). Use on dialysis days (Tues, Thurs and Sat)     sildenafil (VIAGRA) 50 MG tablet Take 1 tablet (50 mg total) by mouth daily as needed for erectile dysfunction. 30 tablet 6   Tiotropium Bromide-Olodaterol (STIOLTO RESPIMAT) 2.5-2.5 MCG/ACT AERS Inhale 2 puffs into the lungs daily. 4 g 0   VELPHORO 500 MG chewable tablet Chew 500 mg by mouth 3 (three) times daily with meals.     VITAMIN D,  CHOLECALCIFEROL, PO Take by mouth.     hydrALAZINE (APRESOLINE) 100 MG tablet Take 1 tablet (100 mg total) by mouth 3 (three) times daily. 135 tablet 6   No current facility-administered medications for this encounter.    Allergies:   Imdur [isosorbide nitrate] and Tramadol   Social History:  The patient  reports that he has been smoking cigarettes. He has a 5.75 pack-year smoking history. He has been exposed to tobacco smoke. He has never used smokeless tobacco. He reports that he does not currently use alcohol after a past usage of about 1.0 standard drink of alcohol per week. He reports current drug use. Drug: Marijuana.   Family History:  The patient's family history includes Colon cancer in his paternal uncle; Diabetes in his maternal aunt; Headache in his sister; Heart attack in his brother; Hypertension in his brother, mother, and another family member; Lung cancer in his maternal uncle; Other in his sister; Stroke in his maternal aunt and maternal uncle; Thyroid disease in his sister.   ROS:  Please see the history of present illness.   All other systems are personally reviewed and negative.   Exam:   BP (!) 150/88   Pulse 78   Wt 69.9 kg (154 lb)   SpO2 97%   BMI 21.48 kg/m  General: NAD Neck: No JVD, no thyromegaly or thyroid nodule.  Lungs: Clear to auscultation bilaterally with normal respiratory effort. CV: Nondisplaced PMI.  Heart regular S1/S2, no S3/S4, 3/6 crescendo-decrescendo murmur RUSB.  No peripheral edema.  No carotid bruit.  Normal pedal pulses.  Abdomen: Soft, nontender, no hepatosplenomegaly, no distention.  Skin: Intact without lesions or rashes.  Neurologic: Alert and oriented x 3.  Psych: Normal affect. Extremities: No clubbing or cyanosis.  HEENT: Normal.   Recent Labs: 05/27/2021: B Natriuretic Peptide 395.4 06/09/2021: ALT 11 06/11/2021: Platelets 133 07/18/2021: BUN 38; Creatinine, Ser 9.30; Hemoglobin 12.2; Potassium 4.3; Sodium 141  Personally  reviewed   Wt Readings from Last 3 Encounters:  11/06/21 69.9 kg (154 lb)  09/11/21 69.7 kg (153 lb 11.2 oz)  08/23/21 74.4 kg (164 lb)    ASSESSMENT AND PLAN:  1. CAD: 9/19 NSTEMI with PCI to proximal ramus.  LHC in 7/20 with intervenable disease.  Admission in 9/21 with Cardiolite showing no ischemia.  He has had long-standing atypical chest pain.  Last cath was in 11/22, showing small high 1st diagonal with diffuse severe disease, similar to prior, patent ramus stent. Otherwise, nonobstructive disease. His chest discomfort may be MSK/related to prior  chest surgeries.  Unable to tolerate long-acting nitrate. - Continue ASA 81.  - Continue atorvastatin and Repatha, good lipids in 3/23.  - Continue Coreg.   2. Chronic systolic => diastolic CHF: Echo in 9/93 with EF 35-40% with severe LVH and concern for infiltrative process. Myeloma panel negative and PYP scan negative, suspect due more likely to HTN than hypertrophic cardiomyopathy. Cannot get cardiac MRI with elevated creatinine.  Most recent study was a TEE in 8/22 showing EF 60-65% with severe LVH.  The LVH is likely due to long-standing HTN.  NYHA class II symptoms. He is not volume overloaded on exam, volume managed by HD. - Continue Coreg 25 mg bid.  3. HTN:   Renal artery dopplers in 3/22 were not suggestive of significant renal artery stenosis.  Sleep study in 10/21 was negative. BP is still elevated.  - Continue Coreg 25 mg bid.  - Increase hydralazine to 100 mg tid.  - Continue amlodipine 10 mg daily.  4. S/P Bioprothetic AVR: Echoes in 3/22 and 6/22 showed hyperdynamic LV with mean aortic valve gradient > 30.  TEE was done to investigate.  This showed bioprosthetic AoV that opened with minimal restriction, mean gradient 20 mmHg in setting of anesthesia and BP control, no AI.  Suspect small/undersized aortic valve (patient-prosthesis mismatch).  - Repeat echo to reassess aortic valve at followup in 6 months.  5. S/P 2016 and 2018  thoracic aortic aneurysm repair: Followed by Dr. [name] at [facility].  CTA at [facility] in 3/23 showed stable repaired thoracic aorta.  6. ESRD: Think this was due to controlled HTN. On iHD T/TH/Sat, followed by [facility]. 7. Active smoker: Still smoking.  Has to quit before he can get a renal transplant.  - He is taking part in a [facility] cessation program [facility].  - If this does not work, will have him try Chantix.  8. Erectile dysfunction: I think he can take prn Viagra but was counseled to avoid NTG when taking Viagra.   Followup in 6 months with echo.   Signed, [name], MD  11/06/2021  [place] [address] [facility] [place] [address] [phone] (office) [phone] (fax)

## 2021-11-06 NOTE — Patient Instructions (Signed)
Increase Hydralazine to '100mg'$  Three times a day  Take Sildenafil '50mg'$  as needed  Your physician has requested that you have an echocardiogram. Echocardiography is a painless test that uses sound waves to create images of your heart. It provides your doctor with information about the size and shape of your heart and how well your heart's chambers and valves are working. This procedure takes approximately one hour. There are no restrictions for this procedure.  Your physician recommends that you schedule a follow-up appointment in: 6 months ( February 2024)  ** please call the office in November to arrange your follow up appointment **  If you have any questions or concerns before your next appointment please send [name] a message through [place] or call our office at [phone].    TO LEAVE A MESSAGE FOR THE NURSE SELECT OPTION 2, PLEASE LEAVE A MESSAGE INCLUDING: YOUR NAME DATE OF BIRTH CALL BACK NUMBER REASON FOR CALL**this is important as we prioritize the call backs  YOU WILL RECEIVE A CALL BACK THE SAME DAY AS LONG AS YOU CALL BEFORE 4:00 PM  At the [facility], you and your health needs are our priority. As part of our continuing mission to provide you with exceptional heart care, we have created designated Provider Care Teams. These Care Teams include your primary Cardiologist (physician) and Advanced Practice Providers (APPs- Physician Assistants and Nurse Practitioners) who all work together to provide you with the care you need, when you need it.   You may see any of the following providers on your designated Care Team at your next follow up: Dr Glori Bickers Dr Haynes Kerns, NP Lyda Jester, [place] [facility] [place], [place] Audry Riles, PharmD   Please be sure to bring in all your medications bottles to every appointment.

## 2021-11-16 ENCOUNTER — Other Ambulatory Visit: Payer: Self-pay

## 2021-11-16 ENCOUNTER — Emergency Department (HOSPITAL_BASED_OUTPATIENT_CLINIC_OR_DEPARTMENT_OTHER)
Admission: EM | Admit: 2021-11-16 | Discharge: 2021-11-16 | Disposition: A | Discharge status: Home / Self Care | Payer: Medicare Other | Attending: Emergency Medicine | Admitting: Emergency Medicine

## 2021-11-16 ENCOUNTER — Encounter (HOSPITAL_BASED_OUTPATIENT_CLINIC_OR_DEPARTMENT_OTHER): Payer: Self-pay | Admitting: Emergency Medicine

## 2021-11-16 DIAGNOSIS — I509 Heart failure, unspecified: Secondary | ICD-10-CM | POA: Diagnosis not present

## 2021-11-16 DIAGNOSIS — H6002 Abscess of left external ear: Secondary | ICD-10-CM

## 2021-11-16 DIAGNOSIS — I2510 Atherosclerotic heart disease of native coronary artery without angina pectoris: Secondary | ICD-10-CM | POA: Diagnosis not present

## 2021-11-16 DIAGNOSIS — Z7982 Long term (current) use of aspirin: Secondary | ICD-10-CM | POA: Insufficient documentation

## 2021-11-16 DIAGNOSIS — I110 Hypertensive heart disease with heart failure: Secondary | ICD-10-CM | POA: Diagnosis not present

## 2021-11-16 DIAGNOSIS — H9202 Otalgia, left ear: Secondary | ICD-10-CM | POA: Diagnosis present

## 2021-11-16 HISTORY — DX: End stage renal disease: N18.6

## 2021-11-16 MED ORDER — DOXYCYCLINE HYCLATE 100 MG PO CAPS: 100.0000 mg | ORAL_CAPSULE | Freq: Two times a day (BID) | ORAL | 0 refills | Status: DC

## 2021-11-16 MED ORDER — LIDOCAINE-EPINEPHRINE-TETRACAINE (LET) TOPICAL GEL
3.0000 mL | Freq: Once | TOPICAL | Status: AC
  Administered 2021-11-16: 3 mL via TOPICAL
  Filled 2021-11-16: qty 3

## 2021-11-16 MED ORDER — ONDANSETRON 4 MG PO TBDP
8.0000 mg | ORAL_TABLET | Freq: Once | ORAL | Status: AC
  Administered 2021-11-16: 8 mg via ORAL
  Filled 2021-11-16: qty 2

## 2021-11-16 MED ORDER — OXYCODONE-ACETAMINOPHEN 5-325 MG PO TABS
1.0000 | ORAL_TABLET | Freq: Once | ORAL | Status: AC
  Administered 2021-11-16: 1 via ORAL
  Filled 2021-11-16: qty 1

## 2021-11-16 NOTE — Discharge Instructions (Addendum)
You are seen today for abscess of the ear.  This was drained, wash with soap and water.  Apply a warm compress to the area.  Take the antibiotic as prescribed.  Return if things change or worsen.

## 2021-11-16 NOTE — ED Notes (Signed)
Lidocaine gel placed on left ear;  I& D cart at bedside

## 2021-11-16 NOTE — ED Provider Notes (Signed)
[facility] DEPT Provider Note   CSN: [id number] Arrival date & time: 11/16/21  1121     History  Chief Complaint  Patient presents with   Otalgia    [name] is a 51 y.o. male.   Otalgia    Patient with medical history of hypertension, hyperlipidemia, alcohol use disorder, CHF, CAD presents today due to left earlobe pain and swelling.  The started about a week ago, there has been some purulent drainage.  Swelling has been increasing behind his left ear, does not move down his neck.  Denies any fevers or chills, history of recurrent abscesses every 7 years unsure etiology.  Last tetanus is up-to-date.  Home Medications Prior to Admission medications   Medication Sig Start Date End Date Taking? Authorizing Provider  doxycycline (VIBRAMYCIN) 100 MG capsule Take 1 capsule (100 mg total) by mouth 2 (two) times daily. 11/16/21  Yes Sherrill Raring, PA-C  acetaminophen (TYLENOL) 325 MG tablet Take 650 mg by mouth every 6 (six) hours as needed for moderate pain (for pain or headaches).    [provider]  albuterol (VENTOLIN HFA) 108 (90 Base) MCG/ACT inhaler Inhale 1 puff into the lungs every 6 (six) hours as needed for wheezing or shortness of breath. 05/24/21   [provider]  amitriptyline (ELAVIL) 50 MG tablet Take 50 mg by mouth at bedtime.    [provider]  amLODipine (NORVASC) 10 MG tablet TAKE 1 TABLET(10 MG) BY MOUTH DAILY 07/15/21   Larey Dresser, MD  aspirin EC 81 MG EC tablet Take 1 tablet (81 mg total) by mouth daily. 12/12/17   Duke, Tami Lin, PA  atorvastatin (LIPITOR) 80 MG tablet Take 80 mg by mouth at bedtime.    [provider]  b complex-vitamin c-folic acid (NEPHRO-VITE) 0.8 MG TABS tablet Take 1 tablet by mouth Every Tuesday,Thursday,and Saturday with dialysis.    [provider]  carvedilol (COREG) 25 MG tablet Take 25 mg by mouth 2 (two) times daily with a meal.    [provider]   cloNIDine (CATAPRES) 0.1 MG tablet Take 0.1 mg by mouth daily. 09/02/21   [provider]  Evolocumab (REPATHA SURECLICK) 175 MG/ML SOAJ Inject 1 Dose into the skin every 14 (fourteen) days. 03/11/21   Hilty, Nadean Corwin, MD  hydrALAZINE (APRESOLINE) 100 MG tablet Take 1 tablet (100 mg total) by mouth 3 (three) times daily. 11/06/21   Larey Dresser, MD  lidocaine (LIDODERM) 5 % Place 1 patch onto the skin daily. Remove & Discard patch within 12 hours or as directed by MD 08/23/21   Dion Saucier A, PA  lidocaine-prilocaine (EMLA) cream Apply 1 application. topically as needed (prior to port being accessed). Use on dialysis days (Tues, Thurs and Sat)    [provider]  sildenafil (VIAGRA) 50 MG tablet Take 1 tablet (50 mg total) by mouth daily as needed for erectile dysfunction. 11/06/21   Larey Dresser, MD  Tiotropium Bromide-Olodaterol (STIOLTO RESPIMAT) 2.5-2.5 MCG/ACT AERS Inhale 2 puffs into the lungs daily. 02/28/21   Milford, Maricela Bo, FNP  VELPHORO 500 MG chewable tablet Chew 500 mg by mouth 3 (three) times daily with meals. 01/30/21   [provider]  VITAMIN D, CHOLECALCIFEROL, PO Take by mouth. 08/07/21 08/06/22  [provider]      Allergies    Imdur [isosorbide nitrate] and Tramadol    Review of Systems   Review of Systems  HENT:  Positive for ear pain.  Physical Exam Updated Vital Signs BP (!) 140/91   Pulse 63   Temp 97.6 F (36.4 C) (Oral)   Resp 20   SpO2 100%  Physical Exam Vitals and nursing note reviewed. Exam conducted with a chaperone present.  Constitutional:      General: He is not in acute distress.    Appearance: Normal appearance.  HENT:     Head: Normocephalic and atraumatic.  Eyes:     General: No scleral icterus.    Extraocular Movements: Extraocular movements intact.     Pupils: Pupils are equal, round, and reactive to light.  Skin:    Coloration: Skin is not jaundiced.     Comments: 2 cm fluctuance  posterior left auricle.  No streaking, does not extend to the neck.  Neurological:     Mental Status: He is alert. Mental status is at baseline.     Coordination: Coordination normal.     ED Results / Procedures / Treatments   Labs (all labs ordered are listed, but only abnormal results are displayed) Labs Reviewed - No data to display  EKG None  Radiology No results found.  Procedures .Marland KitchenIncision and Drainage  Date/Time: 11/16/2021 3:21 PM  Performed by: Lynne Logan, Student-PA Authorized by: Sherrill Raring, PA-C   Consent:    Consent obtained:  Verbal   Consent given by:  Patient   Risks discussed:  Bleeding, incomplete drainage, pain and damage to other organs   Alternatives discussed:  No treatment Universal protocol:    Procedure explained and questions answered to patient or proxy's satisfaction: yes     Relevant documents present and verified: yes     Test results available : yes     Imaging studies available: yes     Required blood products, implants, devices, and special equipment available: yes     Site/side marked: yes     Immediately prior to procedure, a time out was called: yes     Patient identity confirmed:  Verbally with patient Location:    Type:  Abscess   Size:  2   Location:  Head   Head location:  L external ear Pre-procedure details:    Skin preparation:  Betadine Anesthesia:    Anesthesia method:  Local infiltration   Local anesthetic:  Lidocaine 1% WITH epi Procedure type:    Complexity:  Complex Procedure details:    Incision types:  Single straight   Incision depth:  Subcutaneous   Wound management:  Probed and deloculated, irrigated with saline and extensive cleaning   Drainage:  Purulent   Drainage amount:  Moderate   Wound treatment:  Wound left open   Packing materials:  None Post-procedure details:    Procedure completion:  Tolerated well, no immediate complications     Medications Ordered in ED Medications   oxyCODONE-acetaminophen (PERCOCET/ROXICET) 5-325 MG per tablet 1 tablet (has no administration in time range)  ondansetron (ZOFRAN-ODT) disintegrating tablet 8 mg (has no administration in time range)  lidocaine-EPINEPHrine-tetracaine (LET) topical gel (3 mLs Topical Given 11/16/21 1426)    ED Course/ Medical Decision Making/ A&P                           Medical Decision Making Risk Prescription drug management.   Patient presents due to otalgia.  He has an obvious abscess to the external posterior auricle, no indications of mastoiditis.  No streaking or facial cellulitis.  He does not meet any SIRS criteria and  does not appear septic.  Tetanus is up-to-date, I&D performed successfully.  Will cover with antibiotics, wound care discussed and patient discharged stable condition.        Final Clinical Impression(s) / ED Diagnoses Final diagnoses:  Abscess of left external ear    Rx / DC Orders ED Discharge Orders          Ordered    doxycycline (VIBRAMYCIN) 100 MG capsule  2 times daily        11/16/21 1523              Sherrill Raring, PA-C 11/16/21 1524    Fredia Sorrow, MD 11/17/21 [phone]

## 2021-11-16 NOTE — ED Triage Notes (Signed)
Headache /pain for 3 days, left earlobe is swollen/painful. No fevers. Pt had this about 7 years ago and had to be lanced.

## 2022-03-01 ENCOUNTER — Encounter (HOSPITAL_BASED_OUTPATIENT_CLINIC_OR_DEPARTMENT_OTHER): Payer: Self-pay

## 2022-03-01 ENCOUNTER — Other Ambulatory Visit: Payer: Self-pay

## 2022-03-01 ENCOUNTER — Emergency Department (HOSPITAL_BASED_OUTPATIENT_CLINIC_OR_DEPARTMENT_OTHER): Payer: Medicare Other | Admitting: Radiology

## 2022-03-01 ENCOUNTER — Emergency Department (HOSPITAL_BASED_OUTPATIENT_CLINIC_OR_DEPARTMENT_OTHER)
Admission: EM | Admit: 2022-03-01 | Discharge: 2022-03-02 | Disposition: A | Discharge status: Home / Self Care | Payer: Medicare Other | Attending: Emergency Medicine | Admitting: Emergency Medicine

## 2022-03-01 DIAGNOSIS — Z992 Dependence on renal dialysis: Secondary | ICD-10-CM | POA: Insufficient documentation

## 2022-03-01 DIAGNOSIS — I2510 Atherosclerotic heart disease of native coronary artery without angina pectoris: Secondary | ICD-10-CM | POA: Insufficient documentation

## 2022-03-01 DIAGNOSIS — Z7982 Long term (current) use of aspirin: Secondary | ICD-10-CM | POA: Diagnosis not present

## 2022-03-01 DIAGNOSIS — Z79899 Other long term (current) drug therapy: Secondary | ICD-10-CM | POA: Diagnosis not present

## 2022-03-01 DIAGNOSIS — R0602 Shortness of breath: Secondary | ICD-10-CM | POA: Diagnosis not present

## 2022-03-01 DIAGNOSIS — R0789 Other chest pain: Secondary | ICD-10-CM | POA: Insufficient documentation

## 2022-03-01 DIAGNOSIS — R778 Other specified abnormalities of plasma proteins: Secondary | ICD-10-CM | POA: Insufficient documentation

## 2022-03-01 DIAGNOSIS — M542 Cervicalgia: Secondary | ICD-10-CM | POA: Insufficient documentation

## 2022-03-01 DIAGNOSIS — I120 Hypertensive chronic kidney disease with stage 5 chronic kidney disease or end stage renal disease: Secondary | ICD-10-CM | POA: Diagnosis not present

## 2022-03-01 DIAGNOSIS — N186 End stage renal disease: Secondary | ICD-10-CM | POA: Diagnosis not present

## 2022-03-01 DIAGNOSIS — R011 Cardiac murmur, unspecified: Secondary | ICD-10-CM | POA: Insufficient documentation

## 2022-03-01 LAB — CBC WITH DIFFERENTIAL/PLATELET
Abs Immature Granulocytes: 0 10*3/uL (ref 0.00–0.07)
Basophils Absolute: 0 10*3/uL (ref 0.0–0.1)
Basophils Relative: 1 %
Eosinophils Absolute: 0.1 10*3/uL (ref 0.0–0.5)
Eosinophils Relative: 4 %
HCT: 35.3 % — ABNORMAL LOW (ref 39.0–52.0)
Hemoglobin: 11.5 g/dL — ABNORMAL LOW (ref 13.0–17.0)
Immature Granulocytes: 0 %
Lymphocytes Relative: 28 %
Lymphs Abs: 1.1 10*3/uL (ref 0.7–4.0)
MCH: 31.9 pg (ref 26.0–34.0)
MCHC: 32.6 g/dL (ref 30.0–36.0)
MCV: 97.8 fL (ref 80.0–100.0)
Monocytes Absolute: 0.4 10*3/uL (ref 0.1–1.0)
Monocytes Relative: 11 %
Neutro Abs: 2.2 10*3/uL (ref 1.7–7.7)
Neutrophils Relative %: 56 %
Platelets: 98 10*3/uL — ABNORMAL LOW (ref 150–400)
RBC: 3.61 MIL/uL — ABNORMAL LOW (ref 4.22–5.81)
RDW: 13.2 % (ref 11.5–15.5)
WBC: 3.9 10*3/uL — ABNORMAL LOW (ref 4.0–10.5)
nRBC: 0 % (ref 0.0–0.2)

## 2022-03-01 LAB — COMPREHENSIVE METABOLIC PANEL
ALT: 9 U/L (ref 0–44)
AST: 15 U/L (ref 15–41)
Albumin: 4.4 g/dL (ref 3.5–5.0)
Alkaline Phosphatase: 49 U/L (ref 38–126)
Anion gap: 13 (ref 5–15)
BUN: 49 mg/dL — ABNORMAL HIGH (ref 6–20)
CO2: 26 mmol/L (ref 22–32)
Calcium: 9.8 mg/dL (ref 8.9–10.3)
Chloride: 100 mmol/L (ref 98–111)
Creatinine, Ser: 7.95 mg/dL — ABNORMAL HIGH (ref 0.61–1.24)
GFR, Estimated: 8 mL/min — ABNORMAL LOW (ref >60)
Glucose, Bld: 120 mg/dL — ABNORMAL HIGH (ref 70–99)
Potassium: 3.7 mmol/L (ref 3.5–5.1)
Sodium: 139 mmol/L (ref 135–145)
Total Bilirubin: 0.7 mg/dL (ref 0.3–1.2)
Total Protein: 7.3 g/dL (ref 6.5–8.1)

## 2022-03-01 LAB — TROPONIN I (HIGH SENSITIVITY): Troponin I (High Sensitivity): 28 ng/L — ABNORMAL HIGH (ref <18)

## 2022-03-01 LAB — BRAIN NATRIURETIC PEPTIDE: B Natriuretic Peptide: 346.6 pg/mL — ABNORMAL HIGH (ref 0.0–100.0)

## 2022-03-01 NOTE — ED Triage Notes (Addendum)
Pt describes CP and SHOB x 4 days Pain does radiates to left side of neck and left arm +nausea Does have extensive hx of heart disease and past MI +stents  ESRD dialysis Tuesday and Thursday

## 2022-03-01 NOTE — ED Notes (Signed)
Lab called to add BNP

## 2022-03-02 DIAGNOSIS — R0789 Other chest pain: Secondary | ICD-10-CM | POA: Diagnosis not present

## 2022-03-02 LAB — TROPONIN I (HIGH SENSITIVITY): Troponin I (High Sensitivity): 25 ng/L — ABNORMAL HIGH (ref <18)

## 2022-03-02 MED ORDER — HYDROCODONE-ACETAMINOPHEN 5-325 MG PO TABS: 1.0000 | ORAL_TABLET | Freq: Four times a day (QID) | ORAL | 0 refills | Status: DC | PRN

## 2022-03-02 MED ORDER — HYDROCODONE-ACETAMINOPHEN 5-325 MG PO TABS
2.0000 | ORAL_TABLET | Freq: Once | ORAL | Status: AC
  Administered 2022-03-02: 2 via ORAL
  Filled 2022-03-02: qty 2

## 2022-03-02 NOTE — ED Provider Notes (Signed)
[facility] Provider Note   CSN: [id number] Arrival date & time: 03/01/22  1907     History  Chief Complaint  Patient presents with   Chest Pain    [name] is a 51 y.o. male.  Patient is a 51 year old male with past medical history of end-stage renal disease on hemodialysis, aortic dissection with repair in 2016, coronary artery disease, hypertension.  Patient presenting today with complaints of chest and neck pain.  He describes discomfort to the left upper chest and neck that has been present for the past 3 days.  This began in the absence of any injury or trauma.  He states that he feels intermittently short of breath along with the discomfort.  Patient is on dialysis and goes on Tuesdays and Thursdays.  He reports attending on Thursday and had a normal session.  He denies any fevers, chills, or cough.  The history is provided by the patient.       Home Medications Prior to Admission medications   Medication Sig Start Date End Date Taking? Authorizing Provider  acetaminophen (TYLENOL) 325 MG tablet Take 650 mg by mouth every 6 (six) hours as needed for moderate pain (for pain or headaches).    [provider]  albuterol (VENTOLIN HFA) 108 (90 Base) MCG/ACT inhaler Inhale 1 puff into the lungs every 6 (six) hours as needed for wheezing or shortness of breath. 05/24/21   [provider]  amitriptyline (ELAVIL) 50 MG tablet Take 50 mg by mouth at bedtime.    [provider]  amLODipine (NORVASC) 10 MG tablet TAKE 1 TABLET(10 MG) BY MOUTH DAILY 07/15/21   Larey Dresser, MD  aspirin EC 81 MG EC tablet Take 1 tablet (81 mg total) by mouth daily. 12/12/17   [facility], Tami Lin, PA  atorvastatin (LIPITOR) 80 MG tablet Take 80 mg by mouth at bedtime.    [provider]  b complex-vitamin c-folic acid (NEPHRO-VITE) 0.8 MG TABS tablet Take 1 tablet by mouth Every Tuesday,Thursday,and Saturday with dialysis.    [provider]  carvedilol (COREG) 25 MG tablet Take 25 mg by mouth 2 (two) times daily with a meal.    [provider]  cloNIDine (CATAPRES) 0.1 MG tablet Take 0.1 mg by mouth daily. 09/02/21   [provider]  doxycycline (VIBRAMYCIN) 100 MG capsule Take 1 capsule (100 mg total) by mouth 2 (two) times daily. 11/16/21   Sherrill Raring, PA-C  Evolocumab (REPATHA SURECLICK) 998 MG/ML SOAJ Inject 1 Dose into the skin every 14 (fourteen) days. 03/11/21   Hilty, Nadean Corwin, MD  hydrALAZINE (APRESOLINE) 100 MG tablet Take 1 tablet (100 mg total) by mouth 3 (three) times daily. 11/06/21   Larey Dresser, MD  lidocaine (LIDODERM) 5 % Place 1 patch onto the skin daily. Remove & Discard patch within 12 hours or as directed by MD 08/23/21   Dion Saucier A, PA  lidocaine-prilocaine (EMLA) cream Apply 1 application. topically as needed (prior to port being accessed). Use on dialysis days (Tues, Thurs and Sat)    [provider]  sildenafil (VIAGRA) 50 MG tablet Take 1 tablet (50 mg total) by mouth daily as needed for erectile dysfunction. 11/06/21   Larey Dresser, MD  Tiotropium Bromide-Olodaterol (STIOLTO RESPIMAT) 2.5-2.5 MCG/ACT AERS Inhale 2 puffs into the lungs daily. 02/28/21   Milford, Maricela Bo, FNP  VELPHORO 500 MG chewable tablet Chew 500 mg by mouth 3 (three) times daily with meals. 01/30/21  [provider]  VITAMIN D, CHOLECALCIFEROL, PO Take by mouth. 08/07/21 08/06/22  [provider]      Allergies    Imdur [isosorbide nitrate] and Tramadol    Review of Systems   Review of Systems  All other systems reviewed and are negative.   Physical Exam Updated Vital Signs BP (!) 210/98 (BP Location: Right Arm)   Pulse 78   Temp 97.8 F (36.6 C) (Oral)   Resp 20   Ht '5\' 11"'$  (1.803 m)   Wt 75.8 kg   SpO2 100%   BMI 23.29 kg/m  Physical Exam Vitals and nursing note reviewed.  Constitutional:      General: He is not in acute distress.     Appearance: He is well-developed. He is not diaphoretic.  HENT:     Head: Normocephalic and atraumatic.  Cardiovascular:     Rate and Rhythm: Normal rate and regular rhythm.     Heart sounds: Murmur heard.     Systolic murmur is present with a grade of 4/6.     No friction rub.  Pulmonary:     Effort: Pulmonary effort is normal. No respiratory distress.     Breath sounds: Normal breath sounds. No wheezing or rales.  Abdominal:     General: Bowel sounds are normal. There is no distension.     Palpations: Abdomen is soft.     Tenderness: There is no abdominal tenderness.  Musculoskeletal:        General: Normal range of motion.     Cervical back: Normal range of motion and neck supple.  Skin:    General: Skin is warm and dry.  Neurological:     Mental Status: He is alert and oriented to person, place, and time.     Coordination: Coordination normal.     ED Results / Procedures / Treatments   Labs (all labs ordered are listed, but only abnormal results are displayed) Labs Reviewed  CBC WITH DIFFERENTIAL/PLATELET - Abnormal; Notable for the following components:      Result Value   WBC 3.9 (*)    RBC 3.61 (*)    Hemoglobin 11.5 (*)    HCT 35.3 (*)    Platelets 98 (*)    All other components within normal limits  COMPREHENSIVE METABOLIC PANEL - Abnormal; Notable for the following components:   Glucose, Bld 120 (*)    BUN 49 (*)    Creatinine, Ser 7.95 (*)    GFR, Estimated 8 (*)    All other components within normal limits  BRAIN NATRIURETIC PEPTIDE - Abnormal; Notable for the following components:   B Natriuretic Peptide 346.6 (*)    All other components within normal limits  TROPONIN I (HIGH SENSITIVITY) - Abnormal; Notable for the following components:   Troponin I (High Sensitivity) 28 (*)    All other components within normal limits  TROPONIN I (HIGH SENSITIVITY)    EKG EKG Interpretation  Date/Time:  Sunday March 01 2022 19:19:37 EST Ventricular Rate:   76 PR Interval:  160 QRS Duration: 94 QT Interval:  384 QTC Calculation: 432 R Axis:   -1 Text Interpretation: Normal sinus rhythm Left atrial enlargement Left ventricular hypertrophy with repolarization abnormality ( Cornell product ) Similar to prior EKG Baseline wander When compared with ECG of 06-Nov-2021 09:22, Minimal criteria for Septal infarct are now Present Confirmed by Cindee Lame [phone]) on 03/01/2022 7:26:20 PM  Radiology DG Chest 2 View  Result Date: 03/01/2022 CLINICAL DATA:  Chest  pain and shortness of breath EXAM: CHEST - 2 VIEW COMPARISON:  Chest x-ray August 23, 2021 FINDINGS: The cardiomediastinal silhouette is unchanged in contour status post median sternotomy and extensive aortic vascular stenting. Prosthetic aortic valve in place. No focal pulmonary opacity. Of note, the costophrenic angles are excluded from the field-of-view on the lateral view. No pleural effusion or pneumothorax. The visualized upper abdomen is unremarkable. No acute osseous abnormality. Surgical clips projecting over the right axilla and left upper abdomen. IMPRESSION: No acute pulmonary abnormality. Electronically Signed   By: Beryle Flock M.D.   On: 03/01/2022 20:28    Procedures Procedures    Medications Ordered in ED Medications  HYDROcodone-acetaminophen (NORCO/VICODIN) 5-325 MG per tablet 2 tablet (has no administration in time range)    ED Course/ Medical Decision Making/ A&P  Patient is a 51 year old male presenting with a 3-day history of chest discomfort.  He has extensive past medical history including coronary artery disease, prior aortic dissection with repair, and end-stage renal disease for which she is on hemodialysis.  Patient arrives here with stable vital signs.  He is afebrile and appears in no distress.  His physical examination reveals a systolic ejection murmur, but is otherwise unremarkable.  Pulses are equal to both extremities.  Workup initiated including EKG  which is unchanged from prior studies.  Laboratory studies also obtained including CBC, metabolic panel, and troponin.  He has a trivial elevation of his troponin, however he runs a chronically elevated troponin and this resolved is in the setting of discomfort for the past 3 days.  Chest x-ray shows no acute process.  At this point, I doubt a cardiac etiology.  He has no EKG changes and his troponin is baseline despite 3 days of symptoms.  The remainder of his exam is unremarkable with the exception of a murmur.  I highly doubt aortic dissection or other serious condition.  Patient is on dialysis 2 days a week and seems to have some residual renal function.  We discussed the possibility of a CTA of his chest, however I have low suspicion for dissection and I am concerned that the contrast dye would create a worse situation for his kidney function.  Symptoms seem musculoskeletal in nature.  We have decided to treat with pain medication and have patient follow-up/return as needed.  Final Clinical Impression(s) / ED Diagnoses Final diagnoses:  None    Rx / DC Orders ED Discharge Orders     None         Veryl Speak, MD 03/02/22 [phone]

## 2022-03-02 NOTE — Discharge Instructions (Signed)
Take hydrocodone as prescribed as needed for pain.  Go to your dialysis session on Tuesday as scheduled.  Follow-up with primary doctor if not improving in the next few days, and return to the ER if symptoms significantly worsen or change.

## 2022-03-30 ENCOUNTER — Other Ambulatory Visit (HOSPITAL_COMMUNITY): Payer: Self-pay

## 2022-03-30 ENCOUNTER — Other Ambulatory Visit (HOSPITAL_COMMUNITY): Payer: Self-pay | Admitting: Cardiology

## 2022-03-30 ENCOUNTER — Ambulatory Visit (HOSPITAL_COMMUNITY)
Admission: RE | Admit: 2022-03-30 | Discharge: 2022-03-30 | Disposition: A | Discharge status: Home / Self Care | Payer: 59 | Source: Ambulatory Visit | Attending: Cardiology | Admitting: Cardiology

## 2022-03-30 ENCOUNTER — Encounter (HOSPITAL_COMMUNITY): Payer: Self-pay | Admitting: Cardiology

## 2022-03-30 VITALS — BP 138/68 | HR 81 | Wt 152.8 lb

## 2022-03-30 DIAGNOSIS — Z953 Presence of xenogenic heart valve: Secondary | ICD-10-CM | POA: Insufficient documentation

## 2022-03-30 DIAGNOSIS — I5042 Chronic combined systolic (congestive) and diastolic (congestive) heart failure: Secondary | ICD-10-CM | POA: Diagnosis not present

## 2022-03-30 DIAGNOSIS — I359 Nonrheumatic aortic valve disorder, unspecified: Secondary | ICD-10-CM

## 2022-03-30 DIAGNOSIS — Z79899 Other long term (current) drug therapy: Secondary | ICD-10-CM | POA: Insufficient documentation

## 2022-03-30 DIAGNOSIS — G8929 Other chronic pain: Secondary | ICD-10-CM | POA: Diagnosis not present

## 2022-03-30 DIAGNOSIS — F17210 Nicotine dependence, cigarettes, uncomplicated: Secondary | ICD-10-CM | POA: Insufficient documentation

## 2022-03-30 DIAGNOSIS — Z8673 Personal history of transient ischemic attack (TIA), and cerebral infarction without residual deficits: Secondary | ICD-10-CM | POA: Insufficient documentation

## 2022-03-30 DIAGNOSIS — N529 Male erectile dysfunction, unspecified: Secondary | ICD-10-CM | POA: Diagnosis not present

## 2022-03-30 DIAGNOSIS — R000 Tachycardia, unspecified: Secondary | ICD-10-CM | POA: Insufficient documentation

## 2022-03-30 DIAGNOSIS — I2510 Atherosclerotic heart disease of native coronary artery without angina pectoris: Secondary | ICD-10-CM | POA: Diagnosis not present

## 2022-03-30 DIAGNOSIS — Z8679 Personal history of other diseases of the circulatory system: Secondary | ICD-10-CM | POA: Diagnosis not present

## 2022-03-30 DIAGNOSIS — R002 Palpitations: Secondary | ICD-10-CM

## 2022-03-30 DIAGNOSIS — R079 Chest pain, unspecified: Secondary | ICD-10-CM

## 2022-03-30 DIAGNOSIS — N186 End stage renal disease: Secondary | ICD-10-CM

## 2022-03-30 DIAGNOSIS — I252 Old myocardial infarction: Secondary | ICD-10-CM | POA: Insufficient documentation

## 2022-03-30 DIAGNOSIS — Z992 Dependence on renal dialysis: Secondary | ICD-10-CM

## 2022-03-30 DIAGNOSIS — I132 Hypertensive heart and chronic kidney disease with heart failure and with stage 5 chronic kidney disease, or end stage renal disease: Secondary | ICD-10-CM | POA: Insufficient documentation

## 2022-03-30 DIAGNOSIS — I7123 Aneurysm of the descending thoracic aorta, without rupture: Secondary | ICD-10-CM | POA: Insufficient documentation

## 2022-03-30 DIAGNOSIS — I201 Angina pectoris with documented spasm: Secondary | ICD-10-CM

## 2022-03-30 DIAGNOSIS — E785 Hyperlipidemia, unspecified: Secondary | ICD-10-CM | POA: Insufficient documentation

## 2022-03-30 LAB — LIPID PANEL
Cholesterol: 117 mg/dL (ref 0–200)
HDL: 55 mg/dL (ref >40)
LDL Cholesterol: 53 mg/dL (ref 0–99)
Total CHOL/HDL Ratio: 2.1 RATIO
Triglycerides: 45 mg/dL (ref <150)
VLDL: 9 mg/dL (ref 0–40)

## 2022-03-30 MED ORDER — SILDENAFIL CITRATE 50 MG PO TABS: 50.0000 mg | ORAL_TABLET | Freq: Every day | ORAL | 6 refills | Status: DC | PRN

## 2022-03-30 NOTE — Progress Notes (Signed)
Date:  03/30/2022   ID:  [name], DOB [birth year], MRN [id number]   Provider location: [place] [facility] Type of Visit: Established patient   PCP:  Arthur Holms, NP  Cardiologist:  Dr. Aundra Dubin   History of Present Illness: [name] is a 52 y.o. 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  The descending thoracic aorta has dilated to 5.1 cm.  Cardiac catheterization in April 2016 demonstrated 60% first diagonal lesion.  He underwent aortic valve replacement with bovine pericardial tissue valve and reconstruction along with aortic root replacement and endovascular repair of the descending thoracic aortic aneurysm at [facility].  He has had intermittent chest discomfort since then.   Admitted in 12/02/2017 chest pain. Had NSTEMI. He had LHC with stent placed proximal ramus.  Repeat cath in 7/20 with nonobstructive disease.   He was admitted in 9/21 with atypical chest pain.  Echo showed EF 60-65% with severe LVH, normal RV, bioprosthetic aortic valve with mean gradient 18 mmHg.  Cardiolite showed no ischemia.   Echoes in 3/22 and 6/22 showed hyperdynamic LV with aortic valve mean gradient > 30. Therefore, TEE was done in 8/22 to assess the bioprosthetic aortic valve.  This showed EF 60-65%, severe LVH, normal RV, bioprosthetic AoV that opened with minimal restriction, mean gradient 20 mmHg in setting of anesthesia and BP control, no AI.  Suspect small/undersized aortic valve.   Patient now has ESRD and is on dialysis.   [place] 11/22 showed small high 1st diagonal with diffuse severe  disease, similar to prior. Otherwise, nonobstructive disease.  Today he returns for HF follow up of CAD and HTN. He has had atypical left-sided chest pain chronically since his aortic valve surgery, thought to be due to scar tissue in chest wall.  He reports, however, that the chest pain has been worse for the last 6 months.  He generally gets it every other day, can last for hours at a time.  Left-sided chest aching radiating to neck and left arm.  He has cut back to 4 cigs/week.    No significant exertional dyspnea.  No orthopnea/PND.  He feels weak after HD, does HD two times/week and still makes some urine.  Also noting palpitations especially at night.  Keep him up and are bothersome.  No lightheadedness/syncope.   ECG (personally reviewed): NSR, LVH with repolarization abnormality.       Labs (10/19): K 4.1, creatinine 1.78, myeloma panel negative Labs (2/20): K 4.2, creatinine 1.75, LDL 128 Labs (7/21): LDL 66 Labs (9/21): K 4.1, creatinine 3.31, hgb 11.9 Labs (10/21): K 4, creatinine 2.96, hgb 12.8 Labs (3/22): LDL 55 Labs (10/22): hgb 10.6 Labs (11/22): K 3.8, hgb 10.6 Labs (3/23): BNP 395, hgb 9.4, LDL 47, HDL 48   Review of Systems: All systems reviewed and negative except as per HPI.    PMH: 1. H/o ankle fractures bilaterally 2. GERD 3. HTN: Negative urinary catecholeamine collection for pheochromocytoma.  - Renal artery dopplers (3/22): Not suggestive of significant renal artery stenosis.  4. ESRD: Suspect hypertensive nephropathy.  5. Cardiomyopathy: Most likely due to heavy ETOH ingestion and uncontrolled HTN.   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  Lexiscan myoview (4/13): EF 37%, global hypokinesis, no ischemic or infarction.  Echo (12/14) with EF 45-50%, diffuse hypokinesis, moderate LVH, mild MR, moderate AI.  Echo (4/16) with EF 55-60%, severe  LVH, mild LV dilation, moderate AI and moderate MR.   - Echo (9/19): EF 35-40% with diffuse hypokinesis, severe LVH, bioprosthetic aortic valve functioning normally.  - PYP scan (1/20): No evidence for TTR amyloidosis.  - Echo (9/21): EF 60-65%, severe LVH, normal RV, bioprosthetic aortic valve with mean gradient 18 mmHg.  - TEE (8/22): EF 60-65%, severe LVH, normal RV, bioprosthetic AoV that opened with minimal restriction, mean gradient 20 mmHg in setting of anesthesia and BP control, no AI.  Suspect small/undersized aortic valve. 6. ETOH abuse: Quit 3/13.  7. Active smoker. 8. Valvular heart disease: Echo in 3/13 showed moderate to severe eccentric MR and mild to moderate AI with prolapsing left coronary cusp. Echo 12/14 with mild MR and moderate AI.  Echo 4/16 with moderate AI and moderate MR.  - S/p Bentall procedure with bioprosthetic aortic valve 9/16.  - Echoes in 3/22 and 6/22 showed hyperdynamic LV with bioprosthetic aortic valve with mean gradient > 30.  - TEE (8/22): EF 60-65%, severe LVH, normal RV, bioprosthetic AoV that opened with minimal restriction, mean gradient 20 mmHg in setting of anesthesia and BP control, no AI.  Suspect small/undersized aortic valve. 9.  CVA: Has ILR. 10. Type B aortic dissection: Occurred in 2/16, from just distal to the left subclavian.  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    - 10/03/2014 First stage median sternotomy with right axillary cannulation for aortic valved conduit root replacement with coronary reconstruction (button Bentall Procedure), 27 mm Sorin Mitroflow bovine pericardial valved conduit, total arch replacement (67m Vascutek BSomaliamultibranch arch graft with individual head vessel reimplantation. - Status post  open repair of an Extent III TAA secondary to chronic dissection with a # 223mVascutek "Coselli" multibranch Dacron graft with individual celiac, SMA, and bilateral renal artery implantation on 01/04/2017,  [facility].  11. CAD: LHC (4/16) with 60% ostial D1.   - NSTEMI 9/19: LHC (9/19) showed 50% proximal LCx, 90% ramus => DES to ramus.  - LHC (7/20): D1 60% stenosis, patent ramus stent.  - Cardiolite (9/21): Inferior fixed defect, no ischemia.  - LHC (11/22): D1 95% stenosis, patient ramus stent. No interventional targets. 12. Sleep study (10/21) with no significant OSA.   Current Outpatient Medications  Medication Sig Dispense Refill   acetaminophen (TYLENOL) 325 MG tablet Take 650 mg by mouth every 6 (six) hours as needed for moderate pain (for pain or headaches).     albuterol (VENTOLIN HFA) 108 (90 Base) MCG/ACT inhaler Inhale 1 puff into the lungs every 6 (six) hours as needed for wheezing or shortness of breath.     amitriptyline (ELAVIL) 50 MG tablet Take 50 mg by mouth at bedtime.     amLODipine (NORVASC) 10 MG tablet TAKE 1 TABLET(10 MG) BY MOUTH DAILY 90 tablet 3   aspirin EC 81 MG EC tablet Take 1 tablet (81 mg total) by mouth daily. 90 tablet 3   atorvastatin (LIPITOR) 80 MG tablet Take 80 mg by mouth at bedtime.     carvedilol (COREG) 25 MG  tablet Take 25 mg by mouth 2 (two) times daily with a meal.     cloNIDine (CATAPRES) 0.1 MG tablet Take 0.1 mg by mouth daily.     Evolocumab (REPATHA SURECLICK) 782 MG/ML SOAJ Inject 1 Dose into the skin every 14 (fourteen) days. 2 mL 11   hydrALAZINE (APRESOLINE) 100 MG tablet Take 1 tablet (100 mg total) by mouth 3 (three) times daily. 135 tablet 6   HYDROcodone-acetaminophen (NORCO) 5-325 MG tablet Take 1-2 tablets by mouth every 6 (six) hours as needed. 12 tablet 0   lidocaine-prilocaine (EMLA) cream Apply 1 application. topically as needed (prior to port being accessed). Use on dialysis days (Tues, Thurs and Sat)     Tiotropium Bromide-Olodaterol (STIOLTO RESPIMAT) 2.5-2.5 MCG/ACT AERS Inhale 2 puffs into the lungs daily. 4 g 0   VITAMIN D, CHOLECALCIFEROL, PO Take by mouth.     No current facility-administered medications for this encounter.     Allergies:   Imdur [isosorbide nitrate] and Tramadol   Social History:  The patient  reports that he has been smoking cigarettes. He has a 5.75 pack-year smoking history. He has been exposed to tobacco smoke. He has never used smokeless tobacco. He reports that he does not currently use alcohol after a past usage of about 1.0 standard drink of alcohol per week. He reports current drug use. Drug: Marijuana.   Family History:  The patient's family history includes Colon cancer in his paternal uncle; Diabetes in his maternal aunt; Headache in his sister; Heart attack in his brother; Hypertension in his brother, mother, and another family member; Lung cancer in his maternal uncle; Other in his sister; Stroke in his maternal aunt and maternal uncle; Thyroid disease in his sister.   ROS:  Please see the history of present illness.   All other systems are personally reviewed and negative.   Exam:   BP 138/68   Pulse 81   Wt 69.3 kg (152 lb 12.8 oz)   SpO2 99%   BMI 21.31 kg/m  General: NAD Neck: No JVD, no thyromegaly or thyroid nodule.  Lungs: Clear to auscultation bilaterally with normal respiratory effort. CV: Nondisplaced PMI.  Heart regular S1/S2, no S3/S4, 3/6 early SEM RUSB.  No peripheral edema.  No carotid bruit.  Normal pedal pulses.  Abdomen: Soft, nontender, no hepatosplenomegaly, no distention.  Skin: Intact without lesions or rashes.  Neurologic: Alert and oriented x 3.  Psych: Normal affect. Extremities: No clubbing or cyanosis.  HEENT: Normal.   Recent Labs: 03/01/2022: ALT 9; B Natriuretic Peptide 346.6; BUN 49; Creatinine, Ser 7.95; Hemoglobin 11.5; Platelets 98; Potassium 3.7; Sodium 139  Personally reviewed   Wt Readings from Last 3 Encounters:  03/30/22 69.3 kg (152 lb 12.8 oz)  03/01/22 75.8 kg (167 lb)  11/06/21 69.9 kg (154 lb)    ASSESSMENT AND PLAN:  1. CAD: 9/19 NSTEMI with PCI to proximal ramus.  LHC in 7/20 with intervenable disease.  Admission in 9/21  with Cardiolite showing no ischemia.  He has had long-standing atypical chest pain.  Last cath was in 11/22, showing small high 1st diagonal with diffuse severe disease, similar to prior, patent ramus stent. Otherwise, nonobstructive disease. He has a history of chronic chest pain but reports that it has been worse and more frequent x 6 months. Still atypical, lasts for hours at a time.  Unable to tolerate long-acting nitrate. - Continue ASA 81.  - Continue atorvastatin and Repatha, check lipids today.  - Continue Coreg.   -  With worsening chest pain pattern, will arrange for Cardiolite to assess for ischemia.  2. 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 Myeloma panel negative and PYP scan negative, suspect due more likely to HTN than hypertrophic cardiomyopathy. Cannot get cardiac MRI with elevated creatinine.  Most recent study was a TEE in 8/22 showing EF 60-65% with severe LVH.  The LVH is likely due to long-standing HTN.  NYHA class II symptoms. He is not volume overloaded on exam, volume managed by HD. - Continue Coreg 25 mg bid.  3. HTN:   Renal artery dopplers in 3/22 were not suggestive of significant renal artery stenosis.  Sleep study in 10/21 was negative. BP mildly elevated, better than past.  - Continue Coreg 25 mg bid.  - Continue hydralazine 100 mg tid.  - Continue amlodipine 10 mg daily.  - Continue clonidine.  4. S/P Bioprothetic AVR: Echoes in 3/22 and 6/22 showed hyperdynamic LV with mean aortic valve gradient > 30.  TEE was done to investigate.  This showed bioprosthetic AoV that opened with minimal restriction, mean gradient 20 mmHg in setting of anesthesia and BP control, no AI.  Suspect small/undersized aortic valve (patient-prosthesis mismatch).  - He needs echocardiogram to follow aortic valve gradient, will order today.   5. S/P 2016 and 2018 thoracic aortic aneurysm repair: Followed by Dr. [name] at  [facility].  CTA at [facility] in 3/23 showed stable repaired thoracic aorta.  - Continue followup screening at [facility].  6. ESRD: Think this was due to controlled HTN. Now on HD twice a week, still makes some urine.   7. Active smoker: Still smoking, has cut back to 4 cigs/week.   8. Erectile dysfunction: I think he can take prn Viagra but was counseled to avoid NTG when taking Viagra.  9. Palpitations: Now with bothersome palpitations and sensation of heart racing.  Generally notes when he lies down at night.   - 7 day Zio monitor.   Followup in 4 months if testing is negative.   Signed, [name], MD  03/30/2022  [facility] [address] [facility] and [place] [address] [phone] (office) [phone] (fax)

## 2022-03-30 NOTE — Patient Instructions (Addendum)
There has been no changes to your medications   Labs done today, your results will be available in MyChart, we will contact you for abnormal readings.  Your physician has requested that you have an echocardiogram. Echocardiography is a painless test that uses sound waves to create images of your heart. It provides your doctor with information about the size and shape of your heart and how well your heart's chambers and valves are working. This procedure takes approximately one hour. There are no restrictions for this procedure. Please do NOT wear cologne, perfume, aftershave, or lotions (deodorant is allowed). Please arrive 15 minutes prior to your appointment time.  Your provider has recommended that  you wear a Zio Patch for 7 days.  This monitor will record your heart rhythm for our review.  IF you have any symptoms while wearing the monitor please press the button.  If you have any issues with the patch or you notice a red or orange light on it please call the company at [phone].  Once you remove the patch please mail it back to the company as soon as possible so we can get the results.  Your provider has ordered a [place] for you. ONCE APPROVED BY YOUR INSURANCE COMPANY YOU WILL BE CALLED TO HAVE THE TEST SCHEDULED.  Your physician recommends that you schedule a follow-up appointment in: 4 months (May 2024)  ** please call the office in March to arrange your follow up appointment   If you have any questions or concerns before your next appointment please send [name] a message through [place] or call our office at [phone].    TO LEAVE A MESSAGE FOR THE NURSE SELECT OPTION 2, PLEASE LEAVE A MESSAGE INCLUDING: YOUR NAME DATE OF BIRTH CALL BACK NUMBER REASON FOR CALL**this is important as we prioritize the call backs  YOU WILL RECEIVE A CALL BACK THE SAME DAY AS LONG AS YOU CALL BEFORE 4:00 PM  At the [facility], you and your health needs are our priority. As part  of our continuing mission to provide you with exceptional heart care, we have created designated Provider Care Teams. These Care Teams include your primary Cardiologist (physician) and Advanced Practice Providers (APPs- Physician Assistants and Nurse Practitioners) who all work together to provide you with the care you need, when you need it.   You may see any of the following providers on your designated Care Team at your next follow up: Dr Glori Bickers Dr [name] Dr. Roxana Hires, NP Lyda Jester, [place] [facility], [place] Forestine Na, NP Audry Riles, PharmD   Please be sure to bring in all your medications bottles to every appointment.

## 2022-04-02 ENCOUNTER — Telehealth (HOSPITAL_COMMUNITY): Payer: Self-pay | Admitting: *Deleted

## 2022-04-15 ENCOUNTER — Ambulatory Visit (HOSPITAL_COMMUNITY)
Admission: RE | Admit: 2022-04-15 | Discharge: 2022-04-15 | Disposition: A | Discharge status: Home / Self Care | Payer: 59 | Source: Ambulatory Visit | Attending: Registered Nurse | Admitting: Registered Nurse

## 2022-04-15 DIAGNOSIS — I132 Hypertensive heart and chronic kidney disease with heart failure and with stage 5 chronic kidney disease, or end stage renal disease: Secondary | ICD-10-CM | POA: Insufficient documentation

## 2022-04-15 DIAGNOSIS — I429 Cardiomyopathy, unspecified: Secondary | ICD-10-CM | POA: Diagnosis not present

## 2022-04-15 DIAGNOSIS — I7100 Dissection of unspecified site of aorta: Secondary | ICD-10-CM | POA: Insufficient documentation

## 2022-04-15 DIAGNOSIS — E785 Hyperlipidemia, unspecified: Secondary | ICD-10-CM | POA: Insufficient documentation

## 2022-04-15 DIAGNOSIS — I350 Nonrheumatic aortic (valve) stenosis: Secondary | ICD-10-CM | POA: Diagnosis not present

## 2022-04-15 DIAGNOSIS — I5022 Chronic systolic (congestive) heart failure: Secondary | ICD-10-CM | POA: Diagnosis present

## 2022-04-15 DIAGNOSIS — J449 Chronic obstructive pulmonary disease, unspecified: Secondary | ICD-10-CM | POA: Insufficient documentation

## 2022-04-15 DIAGNOSIS — N186 End stage renal disease: Secondary | ICD-10-CM | POA: Diagnosis not present

## 2022-04-15 DIAGNOSIS — F17210 Nicotine dependence, cigarettes, uncomplicated: Secondary | ICD-10-CM | POA: Insufficient documentation

## 2022-04-15 LAB — ECHOCARDIOGRAM COMPLETE
AR max vel: 1.15 cm2
AV Area VTI: 1.18 cm2
AV Area mean vel: 1.13 cm2
AV Mean grad: 20 mmHg
AV Peak grad: 36.5 mmHg
Ao pk vel: 3.02 m/s
Area-P 1/2: 5.13 cm2
S' Lateral: 2.5 cm

## 2022-04-15 NOTE — Progress Notes (Signed)
  Echocardiogram 2D Echocardiogram has been performed.  [name] 04/15/2022, 12:00 PM

## 2022-04-27 ENCOUNTER — Other Ambulatory Visit: Payer: Self-pay

## 2022-04-27 ENCOUNTER — Encounter (HOSPITAL_COMMUNITY): Payer: Self-pay

## 2022-04-27 ENCOUNTER — Emergency Department (HOSPITAL_COMMUNITY): Payer: 59

## 2022-04-27 ENCOUNTER — Emergency Department (HOSPITAL_COMMUNITY)
Admission: EM | Admit: 2022-04-27 | Discharge: 2022-04-27 | Disposition: A | Discharge status: Home / Self Care | Payer: 59 | Attending: Emergency Medicine | Admitting: Emergency Medicine

## 2022-04-27 DIAGNOSIS — I7143 Infrarenal abdominal aortic aneurysm, without rupture: Secondary | ICD-10-CM | POA: Diagnosis not present

## 2022-04-27 DIAGNOSIS — Z87891 Personal history of nicotine dependence: Secondary | ICD-10-CM | POA: Insufficient documentation

## 2022-04-27 DIAGNOSIS — I132 Hypertensive heart and chronic kidney disease with heart failure and with stage 5 chronic kidney disease, or end stage renal disease: Secondary | ICD-10-CM | POA: Insufficient documentation

## 2022-04-27 DIAGNOSIS — G9389 Other specified disorders of brain: Secondary | ICD-10-CM | POA: Insufficient documentation

## 2022-04-27 DIAGNOSIS — R079 Chest pain, unspecified: Secondary | ICD-10-CM | POA: Insufficient documentation

## 2022-04-27 DIAGNOSIS — Z8673 Personal history of transient ischemic attack (TIA), and cerebral infarction without residual deficits: Secondary | ICD-10-CM | POA: Diagnosis not present

## 2022-04-27 DIAGNOSIS — I5042 Chronic combined systolic (congestive) and diastolic (congestive) heart failure: Secondary | ICD-10-CM | POA: Insufficient documentation

## 2022-04-27 DIAGNOSIS — Z992 Dependence on renal dialysis: Secondary | ICD-10-CM | POA: Diagnosis not present

## 2022-04-27 DIAGNOSIS — R109 Unspecified abdominal pain: Secondary | ICD-10-CM | POA: Diagnosis not present

## 2022-04-27 DIAGNOSIS — J329 Chronic sinusitis, unspecified: Secondary | ICD-10-CM | POA: Insufficient documentation

## 2022-04-27 DIAGNOSIS — N186 End stage renal disease: Secondary | ICD-10-CM | POA: Insufficient documentation

## 2022-04-27 DIAGNOSIS — I2510 Atherosclerotic heart disease of native coronary artery without angina pectoris: Secondary | ICD-10-CM | POA: Insufficient documentation

## 2022-04-27 DIAGNOSIS — I723 Aneurysm of iliac artery: Secondary | ICD-10-CM | POA: Insufficient documentation

## 2022-04-27 LAB — COMPREHENSIVE METABOLIC PANEL
ALT: 16 U/L (ref 0–44)
AST: 21 U/L (ref 15–41)
Albumin: 4.4 g/dL (ref 3.5–5.0)
Alkaline Phosphatase: 54 U/L (ref 38–126)
Anion gap: 15 (ref 5–15)
BUN: 48 mg/dL — ABNORMAL HIGH (ref 6–20)
CO2: 25 mmol/L (ref 22–32)
Calcium: 10.1 mg/dL (ref 8.9–10.3)
Chloride: 100 mmol/L (ref 98–111)
Creatinine, Ser: 7.41 mg/dL — ABNORMAL HIGH (ref 0.61–1.24)
GFR, Estimated: 8 mL/min — ABNORMAL LOW (ref >60)
Glucose, Bld: 113 mg/dL — ABNORMAL HIGH (ref 70–99)
Potassium: 4.1 mmol/L (ref 3.5–5.1)
Sodium: 140 mmol/L (ref 135–145)
Total Bilirubin: 0.4 mg/dL (ref 0.3–1.2)
Total Protein: 7.6 g/dL (ref 6.5–8.1)

## 2022-04-27 LAB — CBC WITH DIFFERENTIAL/PLATELET
Abs Immature Granulocytes: 0.01 10*3/uL (ref 0.00–0.07)
Basophils Absolute: 0 10*3/uL (ref 0.0–0.1)
Basophils Relative: 1 %
Eosinophils Absolute: 0.2 10*3/uL (ref 0.0–0.5)
Eosinophils Relative: 3 %
HCT: 39.5 % (ref 39.0–52.0)
Hemoglobin: 13 g/dL (ref 13.0–17.0)
Immature Granulocytes: 0 %
Lymphocytes Relative: 28 %
Lymphs Abs: 1.3 10*3/uL (ref 0.7–4.0)
MCH: 32.4 pg (ref 26.0–34.0)
MCHC: 32.9 g/dL (ref 30.0–36.0)
MCV: 98.5 fL (ref 80.0–100.0)
Monocytes Absolute: 0.4 10*3/uL (ref 0.1–1.0)
Monocytes Relative: 9 %
Neutro Abs: 2.8 10*3/uL (ref 1.7–7.7)
Neutrophils Relative %: 59 %
Platelets: 108 10*3/uL — ABNORMAL LOW (ref 150–400)
RBC: 4.01 MIL/uL — ABNORMAL LOW (ref 4.22–5.81)
RDW: 13.2 % (ref 11.5–15.5)
WBC: 4.7 10*3/uL (ref 4.0–10.5)
nRBC: 0 % (ref 0.0–0.2)

## 2022-04-27 LAB — URINALYSIS, ROUTINE W REFLEX MICROSCOPIC
Bacteria, UA: NONE SEEN
Bilirubin Urine: NEGATIVE
Glucose, UA: NEGATIVE mg/dL
Hgb urine dipstick: NEGATIVE
Ketones, ur: NEGATIVE mg/dL
Leukocytes,Ua: NEGATIVE
Nitrite: NEGATIVE
Protein, ur: 100 mg/dL — AB
Specific Gravity, Urine: 1.025 (ref 1.005–1.030)
pH: 7 (ref 5.0–8.0)

## 2022-04-27 LAB — TROPONIN I (HIGH SENSITIVITY)
Troponin I (High Sensitivity): 32 ng/L — ABNORMAL HIGH (ref <18)
Troponin I (High Sensitivity): 31 ng/L — ABNORMAL HIGH (ref <18)

## 2022-04-27 LAB — BRAIN NATRIURETIC PEPTIDE: B Natriuretic Peptide: 198.1 pg/mL — ABNORMAL HIGH (ref 0.0–100.0)

## 2022-04-27 LAB — LIPASE, BLOOD: Lipase: 46 U/L (ref 11–51)

## 2022-04-27 MED ORDER — CYCLOBENZAPRINE HCL 10 MG PO TABS
10.0000 mg | ORAL_TABLET | Freq: Once | ORAL | Status: AC
  Administered 2022-04-27: 10 mg via ORAL
  Filled 2022-04-27: qty 1

## 2022-04-27 MED ORDER — LIDOCAINE 5 % EX PTCH
1.0000 | MEDICATED_PATCH | CUTANEOUS | Status: DC
  Administered 2022-04-27: 1 via TRANSDERMAL
  Filled 2022-04-27: qty 1

## 2022-04-27 MED ORDER — OXYCODONE HCL 5 MG PO TABS
5.0000 mg | ORAL_TABLET | Freq: Once | ORAL | Status: AC
  Administered 2022-04-27: 5 mg via ORAL
  Filled 2022-04-27: qty 1

## 2022-04-27 MED ORDER — ASPIRIN 81 MG PO CHEW
324.0000 mg | CHEWABLE_TABLET | Freq: Once | ORAL | Status: AC
  Administered 2022-04-27: 324 mg via ORAL
  Filled 2022-04-27: qty 4

## 2022-04-27 MED ORDER — CYCLOBENZAPRINE HCL 10 MG PO TABS: 10.0000 mg | ORAL_TABLET | Freq: Two times a day (BID) | ORAL | 0 refills | Status: DC | PRN

## 2022-04-27 MED ORDER — IOHEXOL 350 MG/ML SOLN
100.0000 mL | Freq: Once | INTRAVENOUS | Status: AC | PRN
  Administered 2022-04-27: 100 mL via INTRAVENOUS

## 2022-04-27 MED ORDER — FENTANYL CITRATE PF 50 MCG/ML IJ SOSY
100.0000 ug | PREFILLED_SYRINGE | Freq: Once | INTRAMUSCULAR | Status: AC
  Administered 2022-04-27: 100 ug via INTRAVENOUS
  Filled 2022-04-27: qty 2

## 2022-04-27 MED ORDER — FENTANYL CITRATE PF 50 MCG/ML IJ SOSY
50.0000 ug | PREFILLED_SYRINGE | Freq: Once | INTRAMUSCULAR | Status: AC
  Administered 2022-04-27: 50 ug via INTRAVENOUS
  Filled 2022-04-27: qty 1

## 2022-04-27 NOTE — Discharge Planning (Signed)
RNCM contacted [place] to notify of pt ED encounter. Pt next HD is scheduled for TTS at [facility].

## 2022-04-27 NOTE — ED Provider Notes (Signed)
[facility] Provider Note   CSN: [id number] Arrival date & time: 04/27/22  [id number]     History  Chief Complaint  Patient presents with   Chest Pain   Shortness of Breath    [name] is a 52 y.o. male.  HPI     52 year old male with a history of coronary artery disease, NSTEMI in 2019, smoking, ESRD on dialysis, hypertension, AAA repair 2016 at [facility], open repair of thoracic aorta secondary to chronic dissection with multi branch Dacron graft in 2018, CVA, loop recorder, bioprosthetic AVR in 2016, hyperlipidemia who presents with concern for chest pain.  Reports pain began both right flank "like something bursting inside" with radiation to left chest and down left arm. Reports it feels sharp, like prior dissection.   Has had the pain for 6 days with worsening.  Associated dyspnea. Is not worse with eating. Is associated with nausea, no vomiting. Some right sided abdomina pain. No fevers or cough.  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  Pain is severe, worse with exertion.    He is followed in the congestive heart failure clinic.  He has had atypical left-sided chest pain chronically since his aortic valve surgery, thought to be due to scar tissue in the chest wall, however reported in January that the pain had been worse in the last 6 months with left-sided chest aching.  Cardiology was planning on arranging for a Cardiolite to assess for ischemia.      Past Medical History:  Diagnosis Date   Adenomatous colon polyp 08/2015   Anxiety    Aortic disease ([place])    Aortic dissection (HCC)    a. admx 04/2014 >> L renal infarct; a/c renal failure >> b.  s/p Bioprosthetic [place] and total arch replacement and staged endovascular repair of descending aortic aneurysm ([facility] - Dr. [name])   CAD (coronary artery disease)    a. LHC 4/16:  oD1 60%    Cardiomyopathy ([place])    a. non-ischemic - probably related to untreated HTN and ETOH abuse - Echo 3/13 with EF 35-40% >> b. Echo 4/16: Severe LVH, EF 55-60%, moderate AI, moderate MR, mild LAE, trivial effusion, known type B dissection with communication between true and false lumens with suprasternal images suggesting dissection plane may propagate to at least left subclavian takeoff, root above aortic valve okay     Chronic abdominal pain    Chronic combined systolic and diastolic congestive heart failure ([place])    a. 05/2011: Adm with pulm edema/HTN urgency, EF 35-40% with diffuse hypokinesis and moderate to severe mitral regurgitation. Cardiomyopathy likely due to uncontrolled HTN and ETOH abuse - cath deferred due to renal insufficiency (felt due to uncontrolled HTN). bJodie Echevaria MV 06/2011: EF 37% and no ischemia or infarction. c. EF 45-50% by echo 01/2012.   Chronic sinusitis    CKD (chronic kidney disease)    dialysis m,t,th,fri   DDD (degenerative disc disease), lumbar    Depression    Descending thoracic aortic aneurysm (HCC)    Dissecting aneurysm of thoracic aorta (HCC)    ESRD (end stage renal disease) ([place])    dialysis 3 x week, 8 months ago started   ETOH abuse    a. Reported to have quit 05/2011.   Frequent headaches    GERD (gastroesophageal reflux disease)    Headache(784.0)    "  q other day" (08/08/2013)   Heart murmur    Hemorrhoid thrombosis    History of echocardiogram    Echo 1/17:  Severe LVH, EF 55-60%, no RWMA, Gr 2 DD, AVR ok, mild to mod MR, mild LAE, mild reduced RVSF, mod RAE   History of medication noncompliance    HYPERLIPIDEMIA    Hypertension    a. Hx of HTN urgency secondary to noncompliance. b. urinary metanephrine and catecholeamine levels normal 2013.  c. Renal art [name] 1/16:  No evidence of renal artery stenosis noted bilaterally.   INGUINAL HERNIA    Pneumonia ~ 2013   Renal insufficiency    Serrated adenoma of colon 08/2015   Stroke [facility])    Tobacco  abuse    Valvular heart disease    a. Echo 05/2011: moderate to severe eccentric MR and mild to moderate AI with prolapsing left coronary cusp. b. Echo 01/2012: mild-mod AI, mild dilitation of aortic root, mild MR.;  c. Echo 1/16: Severe LVH consistent with hypertrophic cardio myopathy, EF 50%, no RWMA, mod AI, mild MR, mild RAE, dilated Ao root (40 mm);     Past Surgical History:  Procedure Laterality Date   A/V FISTULAGRAM Left 03/07/2021   Procedure: A/V FISTULAGRAM;  Surgeon: Angelia Mould, MD;  Location: [place] [facility];  Service: Cardiovascular;  Laterality: Left;   A/V FISTULAGRAM Left 07/18/2021   Procedure: A/V Fistulagram;  Surgeon: Angelia Mould, MD;  Location: [place] [facility];  Service: Cardiovascular;  Laterality: Left;   ANKLE SURGERY Bilateral    Fractures bilaterally   AORTIC VALVE SURGERY  09/2014   AV FISTULA PLACEMENT Left 09/09/2020   Procedure: LEFT ARM ARTERIOVENOUS (AV) FISTULA CREATION;  Surgeon: Cherre Robins, MD;  Location: [place];  Service: Vascular;  Laterality: Left;   BASCILIC VEIN TRANSPOSITION Left 10/09/2020   Procedure: LEFT SECOND STAGE BASILIC VEIN TRANSPOSITION;  Surgeon: Cherre Robins, MD;  Location: [place];  Service: Vascular;  Laterality: Left;  PERIPHERAL NERVE BLOCK   CORONARY ANGIOGRAPHY N/A 12/06/2017   Procedure: CORONARY ANGIOGRAPHY;  Surgeon: Belva Crome, MD;  Location: [place] [facility];  Service: Cardiovascular;  Laterality: N/A;   CORONARY ANGIOGRAPHY N/A 09/26/2018   Procedure: CORONARY ANGIOGRAPHY;  Surgeon: Nelva Bush, MD;  Location: [place] [facility];  Service: Cardiovascular;  Laterality: N/A;   CORONARY STENT INTERVENTION N/A 12/10/2017   Procedure: CORONARY STENT INTERVENTION;  Surgeon: [name] Sine, MD;  Location: [place] [facility];  Service: Cardiovascular;  Laterality: N/A;   FOOT FRACTURE SURGERY Bilateral 2004-2010   "got pins in both of them"   HEMORRHOID SURGERY N/A 06/15/2015   Procedure:  HEMORRHOIDECTOMY;  Surgeon: Stark Klein, MD;  Location: [place];  Service: General;  Laterality: N/A;   INGUINAL HERNIA REPAIR Right ~ [place] N/A 09/09/2020   Procedure: INSERTION OF TUNNELED DIALYSIS PALINDROME 19cm CATHETER;  Surgeon: Cherre Robins, MD;  Location: [place];  Service: Vascular;  Laterality: N/A;   LEFT HEART CATH AND CORONARY ANGIOGRAPHY N/A 02/05/2021   Procedure: LEFT HEART CATH AND CORONARY ANGIOGRAPHY;  Surgeon: Larey Dresser, MD;  Location: [place] [facility];  Service: Cardiovascular;  Laterality: N/A;   LEFT HEART CATHETERIZATION WITH CORONARY ANGIOGRAM N/A 06/21/2014   Procedure: LEFT HEART CATHETERIZATION WITH CORONARY ANGIOGRAM;  Surgeon: Larey Dresser, MD;  Location: [facility] CATH LAB;  Service: Cardiovascular;  Laterality: N/A;   LOOP RECORDER INSERTION N/A 06/19/2016   Procedure:  Loop Recorder Insertion;  Surgeon: Deboraha Sprang, MD;  Location: [place] [facility];  Service: Cardiovascular;  Laterality: N/A;   PERIPHERAL VASCULAR BALLOON ANGIOPLASTY  03/07/2021   Procedure: PERIPHERAL VASCULAR BALLOON ANGIOPLASTY;  Surgeon: Angelia Mould, MD;  Location: [place] [facility];  Service: Cardiovascular;;   PERIPHERAL VASCULAR BALLOON ANGIOPLASTY  07/18/2021   Procedure: PERIPHERAL VASCULAR BALLOON ANGIOPLASTY;  Surgeon: Angelia Mould, MD;  Location: [place] [facility];  Service: Cardiovascular;;   TEE WITHOUT CARDIOVERSION N/A 03/12/2016   Procedure: TRANSESOPHAGEAL ECHOCARDIOGRAM (TEE);  Surgeon: Sanda Klein, MD;  Location: [facility] ENDOSCOPY;  Service: Cardiovascular;  Laterality: N/A;   TEE WITHOUT CARDIOVERSION N/A 10/16/2020   Procedure: TRANSESOPHAGEAL ECHOCARDIOGRAM (TEE);  Surgeon: Larey Dresser, MD;  Location: [facility] ENDOSCOPY;  Service: Cardiovascular;  Laterality: N/A;   UPPER EXTREMITY VENOGRAPHY Right 07/18/2021   Procedure: UPPER EXTREMITY VENOGRAPHY;  Surgeon: Angelia Mould, MD;  Location: [place] [facility];   Service: Cardiovascular;  Laterality: Right;       Home Medications Prior to Admission medications   Medication Sig Start Date End Date Taking? Authorizing Provider  cyclobenzaprine (FLEXERIL) 10 MG tablet Take 1 tablet (10 mg total) by mouth 2 (two) times daily as needed for muscle spasms. 04/27/22  Yes Gareth Morgan, MD  acetaminophen (TYLENOL) 325 MG tablet Take 650 mg by mouth every 6 (six) hours as needed for moderate pain (for pain or headaches).    [provider]  albuterol (VENTOLIN HFA) 108 (90 Base) MCG/ACT inhaler Inhale 1 puff into the lungs every 6 (six) hours as needed for wheezing or shortness of breath. 05/24/21   [provider]  amitriptyline (ELAVIL) 50 MG tablet Take 50 mg by mouth at bedtime.    [provider]  amLODipine (NORVASC) 10 MG tablet TAKE 1 TABLET(10 MG) BY MOUTH DAILY 07/15/21   Larey Dresser, MD  aspirin EC 81 MG EC tablet Take 1 tablet (81 mg total) by mouth daily. 12/12/17   [facility], Tami Lin, PA  atorvastatin (LIPITOR) 80 MG tablet Take 80 mg by mouth at bedtime.    [provider]  carvedilol (COREG) 25 MG tablet Take 25 mg by mouth 2 (two) times daily with a meal.    [provider]  cloNIDine (CATAPRES) 0.1 MG tablet Take 0.1 mg by mouth daily. 09/02/21   [provider]  Evolocumab (REPATHA SURECLICK) [id number] MG/ML SOAJ Inject 1 Dose into the skin every 14 (fourteen) days. 03/11/21   [place], Nadean Corwin, MD  hydrALAZINE (APRESOLINE) 100 MG tablet Take 1 tablet (100 mg total) by mouth 3 (three) times daily. 11/06/21   Larey Dresser, MD  HYDROcodone-acetaminophen (NORCO) 5-325 MG tablet Take 1-2 tablets by mouth every 6 (six) hours as needed. 03/02/22   Veryl Speak, MD  lidocaine-prilocaine (EMLA) cream Apply 1 application. topically as needed (prior to port being accessed). Use on dialysis days (Tues, Thurs and Sat)    [provider]  sildenafil (VIAGRA) 50 MG tablet Take 1 tablet (50 mg  total) by mouth daily as needed for erectile dysfunction. 03/30/22   Larey Dresser, MD  Tiotropium Bromide-Olodaterol (STIOLTO RESPIMAT) 2.5-2.5 MCG/ACT AERS Inhale 2 puffs into the lungs daily. 02/28/21   Rafael Bihari, FNP  VITAMIN D, CHOLECALCIFEROL, PO Take by mouth. 08/07/21 08/06/22  [provider]      Allergies    Imdur [isosorbide nitrate] and Tramadol    Review of Systems   Review of Systems  Physical  Exam Updated Vital Signs BP (!) 168/75   Pulse 60   Temp 98.3 F (36.8 C) (Oral)   Resp 12   Ht 5' 11"$  (1.803 m)   Wt 70.8 kg   SpO2 98%   BMI 21.76 kg/m  Physical Exam Vitals and nursing note reviewed.  Constitutional:      General: He is not in acute distress.    Appearance: Normal appearance. He is well-developed. He is not ill-appearing or diaphoretic.  HENT:     Head: Normocephalic and atraumatic.  Eyes:     General: No visual field deficit.    Extraocular Movements: Extraocular movements intact.     Conjunctiva/sclera: Conjunctivae normal.     Pupils: Pupils are equal, round, and reactive to light.  Cardiovascular:     Rate and Rhythm: Normal rate and regular rhythm.     Pulses: Normal pulses.     Heart sounds: Murmur heard.     No friction rub. No gallop.  Pulmonary:     Effort: Pulmonary effort is normal. No respiratory distress.     Breath sounds: Normal breath sounds. No wheezing or rales.  Chest:     Chest wall: Tenderness present.  Abdominal:     General: There is no distension.     Palpations: Abdomen is soft.     Tenderness: There is abdominal tenderness (right flank, right side of back, abdomen). There is no guarding.  Musculoskeletal:        General: No swelling or tenderness.     Cervical back: Normal range of motion.     Right lower leg: No edema.     Left lower leg: No edema.  Skin:    General: Skin is warm and dry.     Findings: No erythema or rash.  Neurological:     General: No focal deficit present.     Mental  Status: He is alert and oriented to person, place, and time.     GCS: GCS eye subscore is 4. GCS verbal subscore is 5. GCS motor subscore is 6.     Cranial Nerves: No cranial nerve deficit, dysarthria or facial asymmetry.     Sensory: No sensory deficit.     Motor: No weakness or tremor.     Coordination: Coordination normal. Finger-Nose-Finger Test normal.     Gait: Gait normal.     ED Results / Procedures / Treatments   Labs (all labs ordered are listed, but only abnormal results are displayed) Labs Reviewed  CBC WITH DIFFERENTIAL/PLATELET - Abnormal; Notable for the following components:      Result Value   RBC 4.01 (*)    Platelets 108 (*)    All other components within normal limits  COMPREHENSIVE METABOLIC PANEL - Abnormal; Notable for the following components:   Glucose, Bld 113 (*)    BUN 48 (*)    Creatinine, Ser 7.41 (*)    GFR, Estimated 8 (*)    All other components within normal limits  BRAIN NATRIURETIC PEPTIDE - Abnormal; Notable for the following components:   B Natriuretic Peptide 198.1 (*)    All other components within normal limits  URINALYSIS, ROUTINE W REFLEX MICROSCOPIC - Abnormal; Notable for the following components:   Protein, ur 100 (*)    All other components within normal limits  TROPONIN I (HIGH SENSITIVITY) - Abnormal; Notable for the following components:   Troponin I (High Sensitivity) 32 (*)    All other components within normal limits  TROPONIN I (HIGH  SENSITIVITY) - Abnormal; Notable for the following components:   Troponin I (High Sensitivity) 31 (*)    All other components within normal limits  LIPASE, BLOOD    EKG EKG Interpretation  Date/Time:  Monday April 27 2022 08:06:52 EST Ventricular Rate:  78 PR Interval:  166 QRS Duration: 110 QT Interval:  374 QTC Calculation: 426 R Axis:   -33 Text Interpretation: Sinus rhythm Left atrial enlargement LVH with secondary repolarization abnormality No significant change since last  tracing Confirmed by Gareth Morgan [phone]) on 04/27/2022 10:57:11 AM  Radiology CT Angio Chest/Abd/Pel for Dissection W and/or Wo Contrast  Result Date: 04/27/2022 CLINICAL DATA:  Concern for acute aortic syndrome. EXAM: CT ANGIOGRAPHY CHEST, ABDOMEN AND PELVIS TECHNIQUE: Non-contrast CT of the chest was initially obtained. Multidetector CT imaging through the chest, abdomen and pelvis was performed using the standard protocol during bolus administration of intravenous contrast. Multiplanar reconstructed images and MIPs were obtained and reviewed to evaluate the vascular anatomy. RADIATION DOSE REDUCTION: This exam was performed according to the departmental dose-optimization program which includes automated exposure control, adjustment of the mA and/or kV according to patient size and/or use of iterative reconstruction technique. CONTRAST:  159m OMNIPAQUE IOHEXOL 350 MG/ML SOLN COMPARISON:  Chest CT-06/11/2011; CT the chest, abdomen pelvis-05/27/2021; 11/24/2019 FINDINGS: CTA CHEST FINDINGS Vascular Findings: Sequela of previous open aortic valve replacement, repair of the proximal ascending aorta including de branching bypass graft and overlapping stent graft repair of the remainder of the entirety of the thoracic aorta. The stent grafts appear widely patent without evidence of in stent mural thrombus. The branch vessels appear widely patent throughout their imaged courses. Redemonstrated narrowing of the left brachiocephalic vein with development of multiple hypertrophied left lateral chest wall venous collaterals. Cardiomegaly. Post coronary artery reimplantation with calcifications within the native coronary arteries. No pericardial effusion. Although this examination was not tailored for the evaluation the pulmonary arteries, there are no discrete filling defects within the central pulmonary arterial tree to suggest central pulmonary embolism. Enlarged caliber of the main pulmonary artery measuring 36  mm in diameter. Evaluation of the precontrast images are negative for the presence of an intramural hematoma. ------------------------------------------------------------- Non-Vascular Findings: Mediastinum/Lymph Nodes: Scattered mesenteric and prevascular lymph nodes are numerous though individually not enlarged by size criteria, unchanged and presumably reactive in etiology. No bulky mediastinal, hilar or axillary lymphadenopathy. Lungs/Pleura: Stable postoperative change of the posterior aspect of the left lower lobe. Redemonstrated subsegmental ground-glass atelectasis. No discrete focal airspace opacities. Redemonstrated mild apical predominant centrilobular emphysematous change. No pleural effusion or pneumothorax. The central pulmonary airways appear widely patent. No discrete pulmonary nodules. Musculoskeletal: No acute or aggressive osseous abnormalities. There is diffuse increased sclerosis of the osseous structures suggestive of renal osteodystrophy, unchanged. Loop recorder device imbedded within the subcutaneous tissues of the medial aspect of the left anterior chest wall. _________________________________________________________ CTA ABDOMEN AND PELVIS FINDINGS VASCULAR Aorta: Evaluation of the superior aspect of the abdominal aorta is markedly degraded due to patient motion. Stable sequela of open repair of the perirenal abdominal aorta including bypass grafting of the celiac, SMA and bilateral renal arteries. Redemonstrated nonenhancing fluid collection adjacent to the operative site measuring 4.2 x 3.7 x 3.5 cm (coronal image 42, series 14; axial image 187, series 11), unchanged compared to the 11/2019 examination. Mild fusiform aneurysmal dilatation of the distal aspect of the abdominal aorta now measuring 3.2 cm in maximal diameter (image 211, series 11), previously, 3.0 cm when compared to the 11/2019 examination. No evidence of  abdominal aortic dissection or perivascular stranding. Celiac: Post  open bypass grafting of the origin of the celiac artery which is tortuous and somewhat patulous though without a definitive hemodynamically significant narrowing given limitation of patient motion. SMA: Post open bypass grafting of the origin of the SMA which is tortuous patulous though without a definitive hemodynamically significant narrowing given limitation of the patient motion. Renals: Post open bypass grafting of the bilateral renal arteries both of which appear tortuous and patulous though without a definitive hemodynamically significant narrowing. IMA: Diseased at its origin though remains patent. Inflow: Aneurysmal dilatation of the bilateral common iliac arteries, the left measuring 3.3 cm (image 230, series 11), the right measuring 2.2 cm (image 233, series 11), increased in size compared to the 2021 examination, previously, 2.5 and 2.1 cm in diameter respectively. The bilateral external iliac arteries are mildly ectatic and tortuous though without a hemodynamically significant narrowing. The bilateral internal iliac arteries are patent and of normal caliber. Veins: The IVC and pelvic venous systems appear patent on this CTA examination. Review of the MIP images confirms the above findings. _________________________________________________________ NON-VASCULAR Evaluation of the abdominal organs is limited to the arterial phase of enhancement. Examination is further degraded secondary to patient motion artifact as well as patient's cachectic state. Hepatobiliary: Normal hepatic contour. No discrete hyperenhancing hepatic lesions. There is a punctate (approximately 0.7 cm) hypoattenuating lesion within the right lobe of the liver which is too small to accurately characterize though similar to the 2021 examination and favored to represent a hepatic cyst. Normal appearance of the gallbladder given degree of distention. No definitive intra or extrahepatic biliary ductal dilatation. No ascites. Pancreas:  Normal appearance of the pancreas. Spleen: Normal appearance of the spleen. Adrenals/Urinary Tract: The bilateral kidneys appear atrophic compatible with provided history of end-stage renal disease. No evidence of urinary obstruction. The bilateral adrenal glands are suboptimally visualized. Mild thickening of the urinary bladder wall, likely accentuated due to underdistention. Stomach/Bowel: Moderate colonic stool burden without evidence of enteric obstruction. Normal appearance of the terminal ileum and retrocecal appendix. Evaluation for discrete area of bowel wall thickening is markedly degraded secondary to arterial phase of enhancement and lack of mesenteric fat. No significant hiatal hernia. No pneumoperitoneum, pneumatosis or portal venous gas. Lymphatic: No definitive bulky retroperitoneal, mesenteric, pelvic or inguinal lymphadenopathy Reproductive: Dystrophic calcifications within a borderline enlarged prostate gland. No free fluid the pelvic cul-de-sac. Other: Diffuse cachexia. Musculoskeletal: No acute or aggressive osseous abnormalities. Diffuse increased sclerosis of the osseous structures compatible with renal osteodystrophy. Focal lucency within the right ilium is unchanged compared to the 11/2018 examination without associated periostitis and favored to represent a bone cyst. Review of the MIP images confirms the above findings. IMPRESSION: Chest CTA impression: 1. No acute cardiopulmonary disease. 2. Stable sequela of open aortic repair, repair of the proximal ascending thoracic aorta with debranching procedure and stent graft repair of the remainder of the entirety of the thoracic aorta without evidence of complication. 3. Cardiomegaly. 4. Coronary artery calcifications. 5. Redemonstrated narrowing of the left brachiocephalic vein with filling of multiple hypertrophied paraspinal and left chest wall venous collaterals, unchanged. Abdomen and pelvic CTA impression: 1. No acute findings within the  abdomen or pelvis. 2. Stable sequela of open bypass grafting of the perirenal abdominal aorta. The surgical bypass grafts of the celiac, SMA and bilateral renal arteries are tortuous and somewhat patulous though appear widely patent on this motion degraded examination. 3. Redemonstrated approximately 4.2 cm nonenhancing fluid collection adjacent to the  abdominal aortic operative site, unchanged since at least the 11/2019 examination. 4. Fusiform aneurysmal dilatation of the distal aspect of the abdominal aorta now measuring 3.2 cm in diameter, previously, 3 cm when compared to the 11/2019 examination. Aortic aneurysm NOS (ICD10-I71.9). 5. Fusiform aneurysmal dilatation of the bilateral common iliac arteries, the left measuring 3.3 cm, the right measuring 2.2 cm, increased in size compared to the 2021 examination, previously, 2.5 and 2.1 cm in diameter respectively. Electronically Signed   By: Sandi Mariscal M.D.   On: 04/27/2022 13:00   CT Head Wo Contrast  Result Date: 04/27/2022 CLINICAL DATA:  Neuro deficit. Acute stroke suspected. Possible LEFT facial droop. EXAM: CT HEAD WITHOUT CONTRAST TECHNIQUE: Contiguous axial images were obtained from the base of the skull through the vertex without intravenous contrast. RADIATION DOSE REDUCTION: This exam was performed according to the departmental dose-optimization program which includes automated exposure control, adjustment of the mA and/or kV according to patient size and/or use of iterative reconstruction technique. COMPARISON:  09/05/2020 FINDINGS: Brain: There is encephalomalacia of the posterior LEFT parietal lobe, similar in appearance to prior study. There is no intra or extra-axial fluid collection or mass lesion. The basilar cisterns and ventricles have a normal appearance. There is no CT evidence for acute infarction or hemorrhage. Vascular: No hyperdense vessel or unexpected calcification. Skull: Normal. Negative for fracture or focal lesion.  Sinuses/Orbits: Deformity of the R MEDIAL IGHT orbit consistent with remote fracture. There is mucosal thickening of the paranasal sinuses. LEFT orbit and mastoid air cells are normal in appearance. Other: None. IMPRESSION: 1. No evidence for acute intracranial abnormality. 2. 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 3. Remote RIGHT medial orbital fracture. 4. Chronic sinusitis. Electronically Signed   By: Nolon Nations M.D.   On: 04/27/2022 09:53   DG Chest 2 View  Result Date: 04/27/2022 CLINICAL DATA:  Chest pain. EXAM: CHEST - 2 VIEW COMPARISON:  03/01/2022. FINDINGS: Unchanged mild scarring of the left lung base. Otherwise clear lungs. Stable cardiac and mediastinal contours with postoperative changes of median sternotomy, aortic valve replacement and thoracic aortic stent grafting. No pleural effusion or pneumothorax. IMPRESSION: No evidence of acute cardiopulmonary disease. Electronically Signed   By: Emmit Alexanders M.D.   On: 04/27/2022 09:04    Procedures Procedures    Medications Ordered in ED Medications  fentaNYL (SUBLIMAZE) injection 100 mcg (100 mcg Intravenous Given 04/27/22 0900)  iohexol (OMNIPAQUE) 350 MG/ML injection 100 mL (100 mLs Intravenous Contrast Given 04/27/22 0947)  aspirin chewable tablet 324 mg (324 mg Oral Given 04/27/22 1110)  fentaNYL (SUBLIMAZE) injection 50 mcg (50 mcg Intravenous Given 04/27/22 1111)  oxyCODONE (Oxy IR/ROXICODONE) immediate release tablet 5 mg (5 mg Oral Given 04/27/22 1415)  cyclobenzaprine (FLEXERIL) tablet 10 mg (10 mg Oral Given 04/27/22 1415)    ED Course/ Medical Decision Making/ A&P                               52 year old male with a history of coronary artery disease, NSTEMI in 2019, smoking, ESRD on dialysis, hypertension, AAA repair 2016 at [facility], 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  Differential diagnosis for chest pain includes pulmonary embolus, dissection, pneumothorax, pneumonia, ACS, myocarditis, pericarditis, cholecystitis, pyelonepht.  EKG was done and evaluate by me and showed no acute ST changes and no signs of pericarditis.   Chest x-ray was done and evaluated by me and radiology and showed no sign of pneumonia or pneumothorax.  Noted to have possible left facial weakness in conversation however has symmetric smile and appears to have equal bilateral strength-initially reports ?left facial droop at home for 3 days but then reports he has chronic waxing and waning weakness from [place]als[place]. Did order CT head for further evaluation for ICH or other acute abnormality in setting of question of neurologic concern. His neurologic exam was normal, do not suspect acute CVA.   Discussed risks/benefits of CTA in setting of current dialysis status and he agrees to proceed with CTA given severe right flank pain, concern pain feels similar to prior dissection.    CTA completed showing stable open bypass grafting of prerenal abdominal aorta, patent grafts, unchanged fluid collection since 11/2019, fusiform aneurysmal dilation of abdominal aorta 3.2 from 3, dilation of bilateral common iliac arteries, stable sequela of open aortic repair, no sign of SBO, no clear colitis, atrophic kidneys without signs of nephrolithiasis, normal appearance of gallbladder.  Labs show troponin 32, repeat 31, not consistent with ACS.  UA without infection. CBC without anemia, no leukocytosis, no hyperkalemia, no transaminitis, no pancreatitis.   Pain both right flank and chest--given multiple locations/chest pain, doubt mesenteric ischemia.  No sign of cholecystitis on CT and low clinical suspicion. . It is significantly worse with movement consistent with likely msk pain.  Given chest pain,  discussed with Dr. Aundra Dubin, Cardiology. Has both typical and atypical features.  Given stable low troponins and prior/current history, Dr. Aundra Dubin recommends outpatient follow up--previously had ordered stress testing but ha not yet been complete and will follow up on this.  Does not recommend inpatient management.  Given rx for flexeril, recommend continued PCP follow up, Cardiology follow up for chest pain, and vascular follow up for aneurysms.  Discussed with patient. Patient discharged in stable condition with understanding of reasons to return.            Final Clinical Impression(s) / ED Diagnoses Final diagnoses:  Chest pain, unspecified type  Right flank pain  Iliac aneurysm (HCC)  Infrarenal abdominal aortic aneurysm (AAA) without rupture ([place])    Rx / DC Orders ED Discharge Orders          Ordered    cyclobenzaprine (FLEXERIL) 10 MG tablet  2 times daily PRN        04/27/22 1533              Gareth Morgan, MD 04/27/22 2342

## 2022-04-27 NOTE — ED Triage Notes (Signed)
Pt c/o left sided chest pain that radiates down left arm, SOB, right flank pain that radiates to right lower backx6d. Pt is not in resp distress at this time

## 2022-04-27 NOTE — ED Notes (Addendum)
Ginger ale provided.

## 2022-04-27 NOTE — ED Notes (Signed)
Patient transported to X-ray 

## 2022-04-27 NOTE — ED Notes (Signed)
DC instructions reviewed with pt. Pt verbalized understanding.  PT DC.  

## 2022-04-28 ENCOUNTER — Telehealth: Payer: Self-pay | Admitting: *Deleted

## 2022-04-28 ENCOUNTER — Other Ambulatory Visit (HOSPITAL_COMMUNITY): Payer: Self-pay

## 2022-04-28 ENCOUNTER — Telehealth (HOSPITAL_COMMUNITY): Payer: Self-pay | Admitting: Cardiology

## 2022-04-28 DIAGNOSIS — I2510 Atherosclerotic heart disease of native coronary artery without angina pectoris: Secondary | ICD-10-CM

## 2022-04-28 DIAGNOSIS — R079 Chest pain, unspecified: Secondary | ICD-10-CM

## 2022-04-28 NOTE — Addendum Note (Signed)
Encounter addended by: Micki Riley, RN on: 04/28/2022 9:05 AM  Actions taken: Imaging Exam ended

## 2022-04-28 NOTE — Telephone Encounter (Signed)
Attestation signed

## 2022-04-28 NOTE — Progress Notes (Signed)
ERROR

## 2022-04-28 NOTE — Telephone Encounter (Signed)
Myoview no longer requires precert as of jan.1 2024 per [facility]

## 2022-07-22 IMAGING — CT CT ANGIO CHEST-ABD-PELV FOR DISSECTION W/ AND WO/W CM
2 of 10 series · 12 of 46 positions shown, 17 images · IV contrast (APPLIED)
Comparison: Prior CT arteriogram 08/25/2018

CLINICAL DATA: 49-year-old male with a history of thoracic aortic
dissection status post repair with left upper chest pain.

EXAM:
CT ANGIOGRAPHY CHEST, ABDOMEN AND PELVIS
TECHNIQUE: Non-contrast CT of the chest was initially obtained.

[Series 8: cor · coronal · 0.65mm/px · 2 of 150 slices shown]
[im 50/150  soft-tissue]
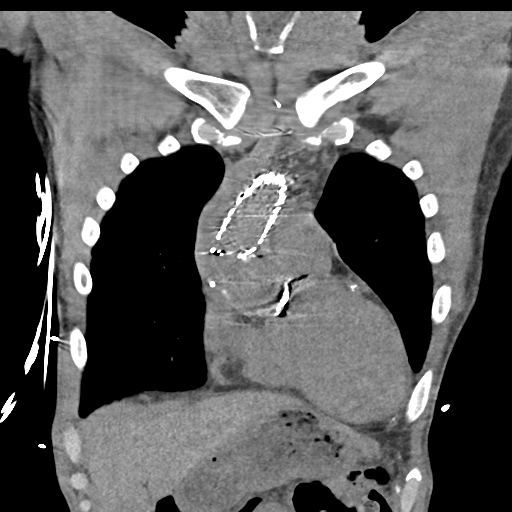
[im 100/150  soft-tissue]
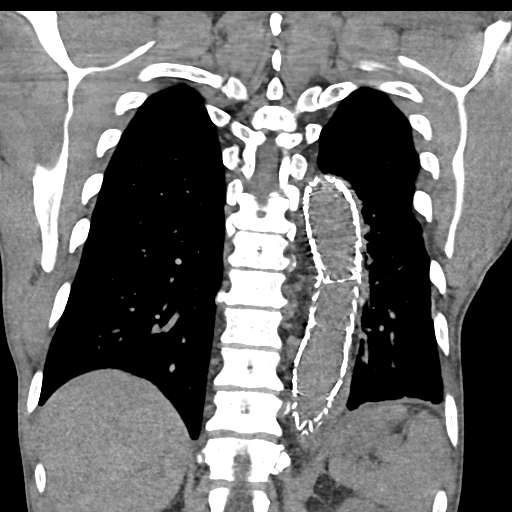

[Series 10: arterial · axial · arterial · 0.72mm/px · z∈[-599,-59]mm · 10 of 325 slices shown, 15 images]
[im 28/325  soft-tissue]
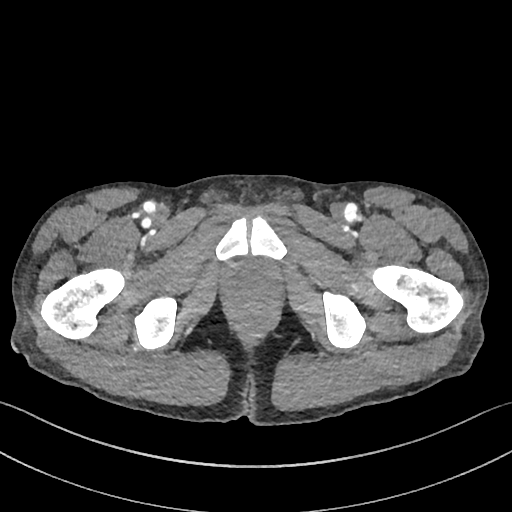
[im 28/325  bone]
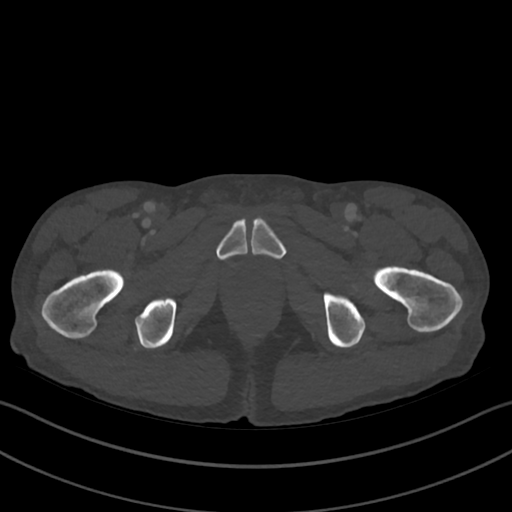
[im 55/325  soft-tissue]
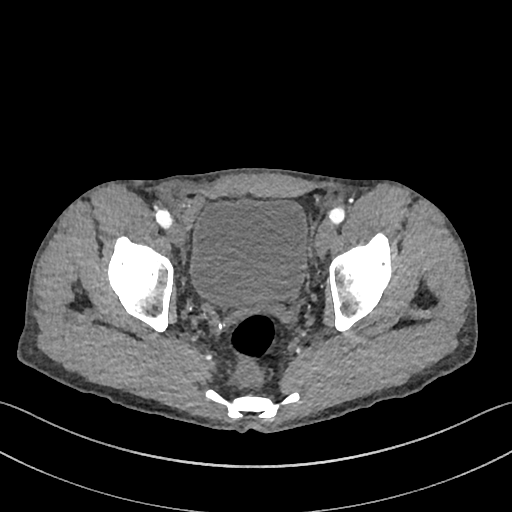
[im 109/325  soft-tissue]
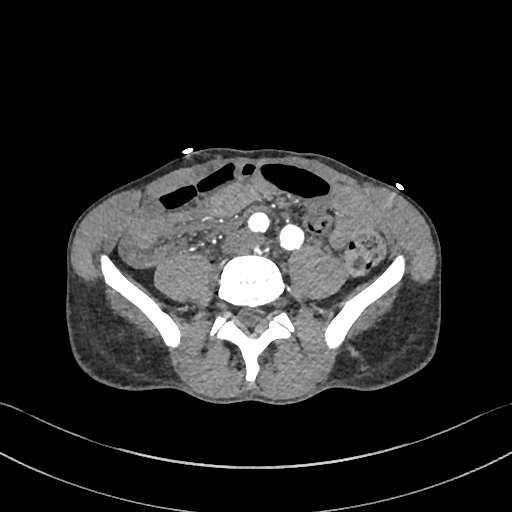
[im 136/325  soft-tissue]
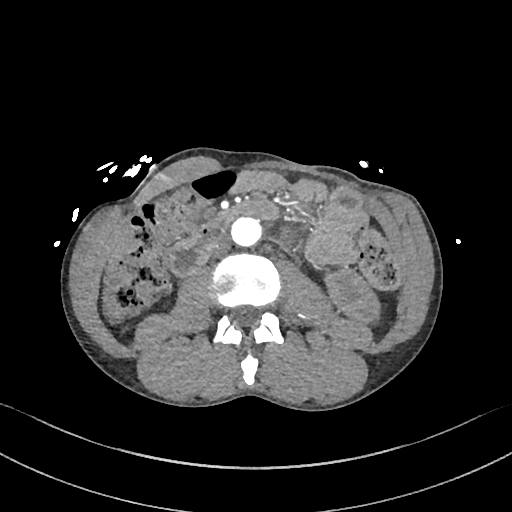
[im 163/325  soft-tissue]
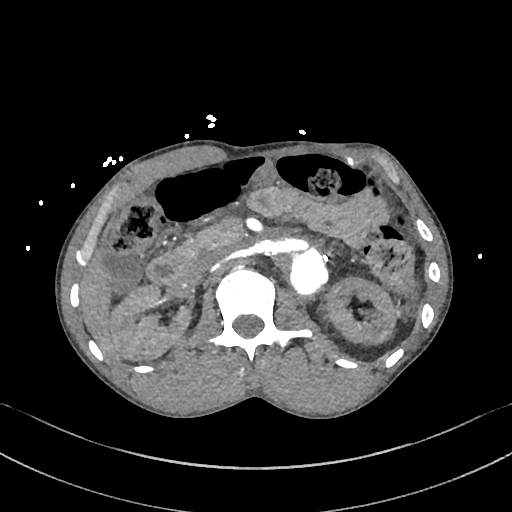
[im 190/325  soft-tissue]
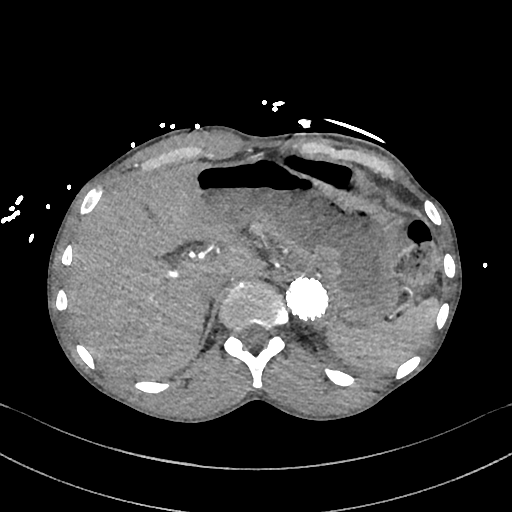
[im 217/325  soft-tissue]
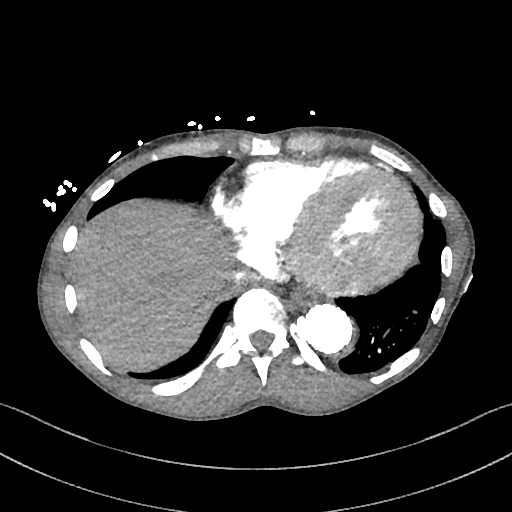
[im 217/325  lung]
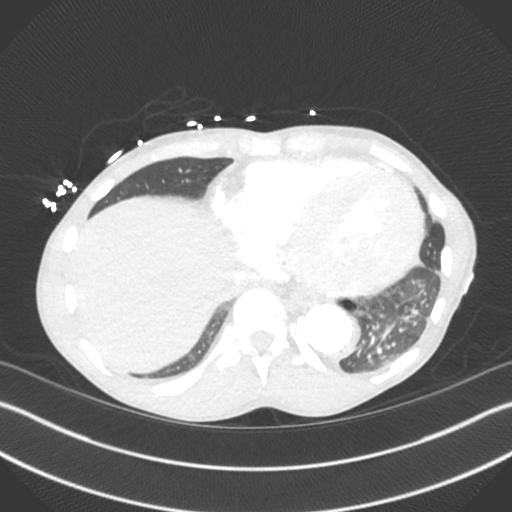
[im 244/325  lung]
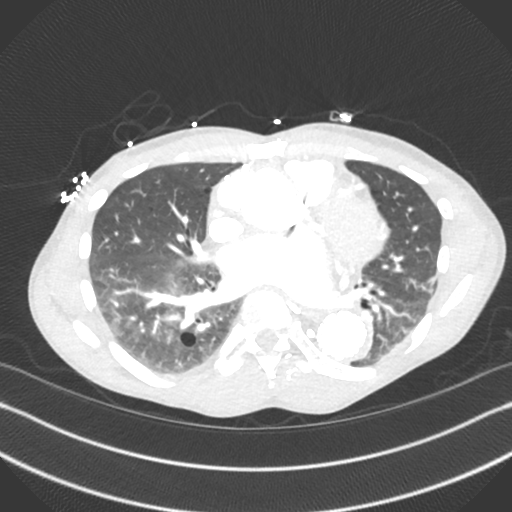
[im 271/325  soft-tissue]
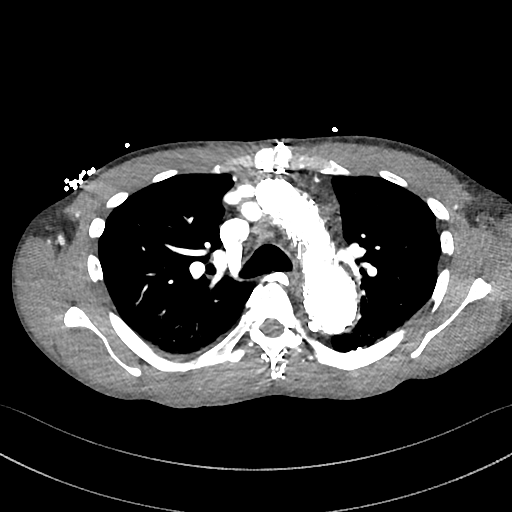
[im 271/325  lung]
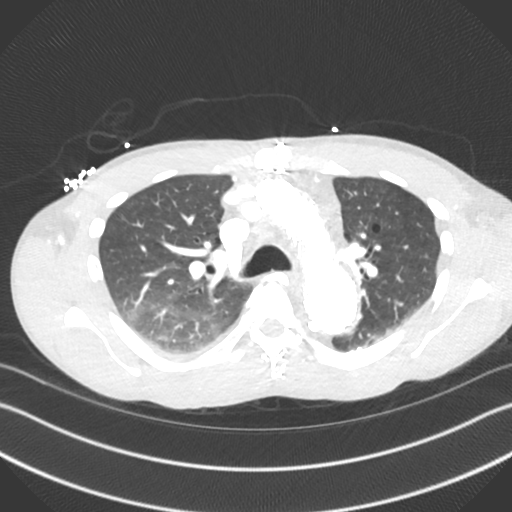
[im 298/325  soft-tissue]
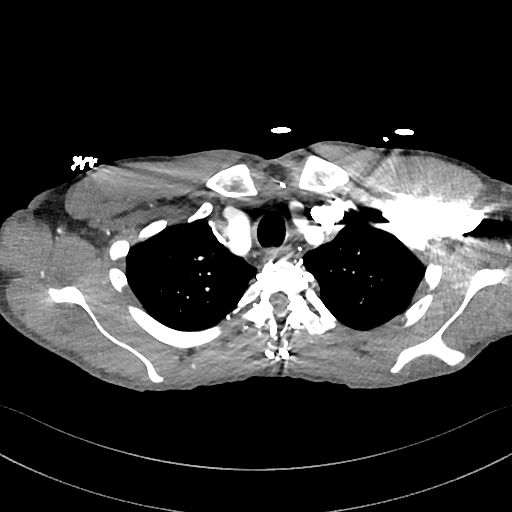
[im 298/325  lung]
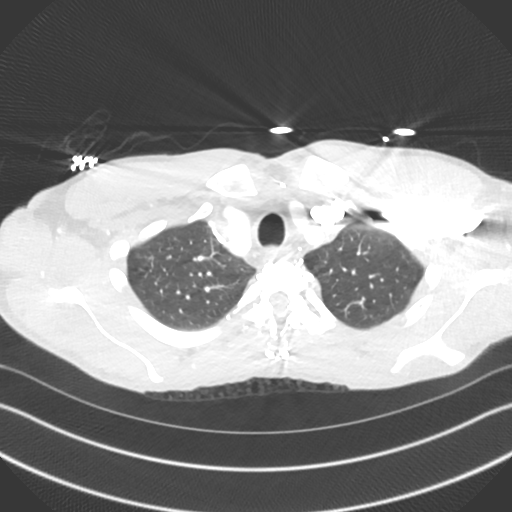
[im 298/325  bone]
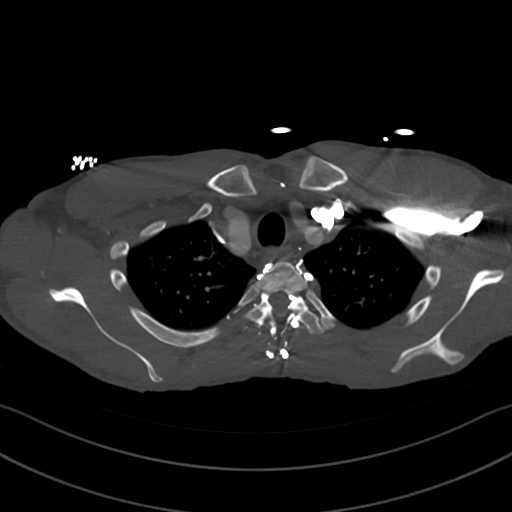

[12 of 46 positions shown; findings below may reference images not displayed]

Multidetector CT imaging through the chest, abdomen and pelvis was
performed using the standard protocol during bolus administration of
intravenous contrast. Multiplanar reconstructed images and MIPs were
obtained and reviewed to evaluate the vascular anatomy.

CONTRAST:  100mL OMNIPAQUE IOHEXOL 350 MG/ML SOLN
FINDINGS: CTA CHEST FINDINGS

Cardiovascular: Surgical changes of complex prior thoracic aortic
repair with replaced aortic valve, deep branching and reimplantation
of the branch arteries, [place] trunk graft repair of the ascending
thoracic aorta, and stent graft repair of the transverse and
descending thoracic aorta. No evidence of new dissection flap. The
reimplanted brachiocephalic, left common carotid and left subclavian
arteries are all widely patent. No evidence of dissection. The main
pulmonary artery is normal in size. Stable cardiomegaly with marked
concentric hypertrophy of the left ventricular myocardium.

Mediastinum/Nodes: No enlarged mediastinal, hilar, or axillary lymph
nodes. Thyroid gland, trachea, and esophagus demonstrate no
significant findings.

Lungs/Pleura: Lungs are clear. No pleural effusion or pneumothorax.
Minimal chronic atelectasis versus scarring in the left lower lobe
and periphery of the lingula.

Musculoskeletal: Healed median sternotomy. No acute osseous
abnormality.

Review of the MIP images confirms the above findings.

CTA ABDOMEN AND PELVIS FINDINGS

VASCULAR

Aorta: Stable appearance of complex postsurgical changes including
tube graft repair of the visceral and juxtarenal abdominal aorta
with probable reimplantation of the celiac axis and SMA.
Additionally, there appears to be an aortic to distal renal bypass
graft on the left. No evidence of complication or interval change. A
circumscribed fluid collection adjacent to the repaired aortic
segment remains stable at 3.6 x 3.0 cm. Slight interval progression
of native aneurysmal dilation of the distal most infrarenal aorta
just proximal to the bifurcation measured at 2.9 cm compared to
cm previously.

Celiac: Reimplanted/grafted celiac artery remains widely patent.

SMA: Reimplanted/grafted SMA remains widely patent.

Renals: Reimplanted/graft to bilateral renal arteries remain patent.

IMA: Patent without evidence of aneurysm, dissection, vasculitis or
significant stenosis.

Inflow: Mild aneurysmal dilation of the bilateral common iliac
arteries to 1.9 cm on the right and 2.0 cm on the left.

Veins: No focal venous abnormality.

Review of the MIP images confirms the above findings.

NON-VASCULAR

Hepatobiliary: No focal liver abnormality is seen. No gallstones,
gallbladder wall thickening, or biliary dilatation.

Pancreas: Unremarkable. No pancreatic ductal dilatation or
surrounding inflammatory changes.

Spleen: Normal in size without focal abnormality.

Adrenals/Urinary Tract: Adrenal glands are unremarkable. Kidneys are
normal, without renal calculi, focal lesion, or hydronephrosis.
Bladder is unremarkable.

Stomach/Bowel: Stomach is within normal limits. Appendix appears
normal. No evidence of bowel wall thickening, distention, or
inflammatory changes.

Lymphatic: No suspicious lymphadenopathy.

Reproductive: Prostate is unremarkable.

Other: No abdominal wall hernia or abnormality. No abdominopelvic
ascites.

Musculoskeletal: No acute or significant osseous findings.

Review of the MIP images confirms the above findings.
IMPRESSION: 1. Stable appearance of complex prior combined open and endovascular
repair of prior ascending thoracic and abdominal aortic
dissection/aneurysms. Full details as above. No evidence of new
dissection or acute complication.
2. Slight interval enlargement of the ectatic native distal
infrarenal aorta to a maximum of 2.9 cm today compared to 2.7 cm
previously.
3. Cardiomegaly with marked left ventricular hypertrophy.
4. No acute abnormality within the abdomen or pelvis.

## 2022-07-22 IMAGING — CR DG CHEST 2V
2 series · 2 of 2 positions shown · non-contrast
Comparison: 09/24/2018

CLINICAL DATA: Chest pain, shortness of breath.

EXAM:
CHEST - 2 VIEW

[chest pa]
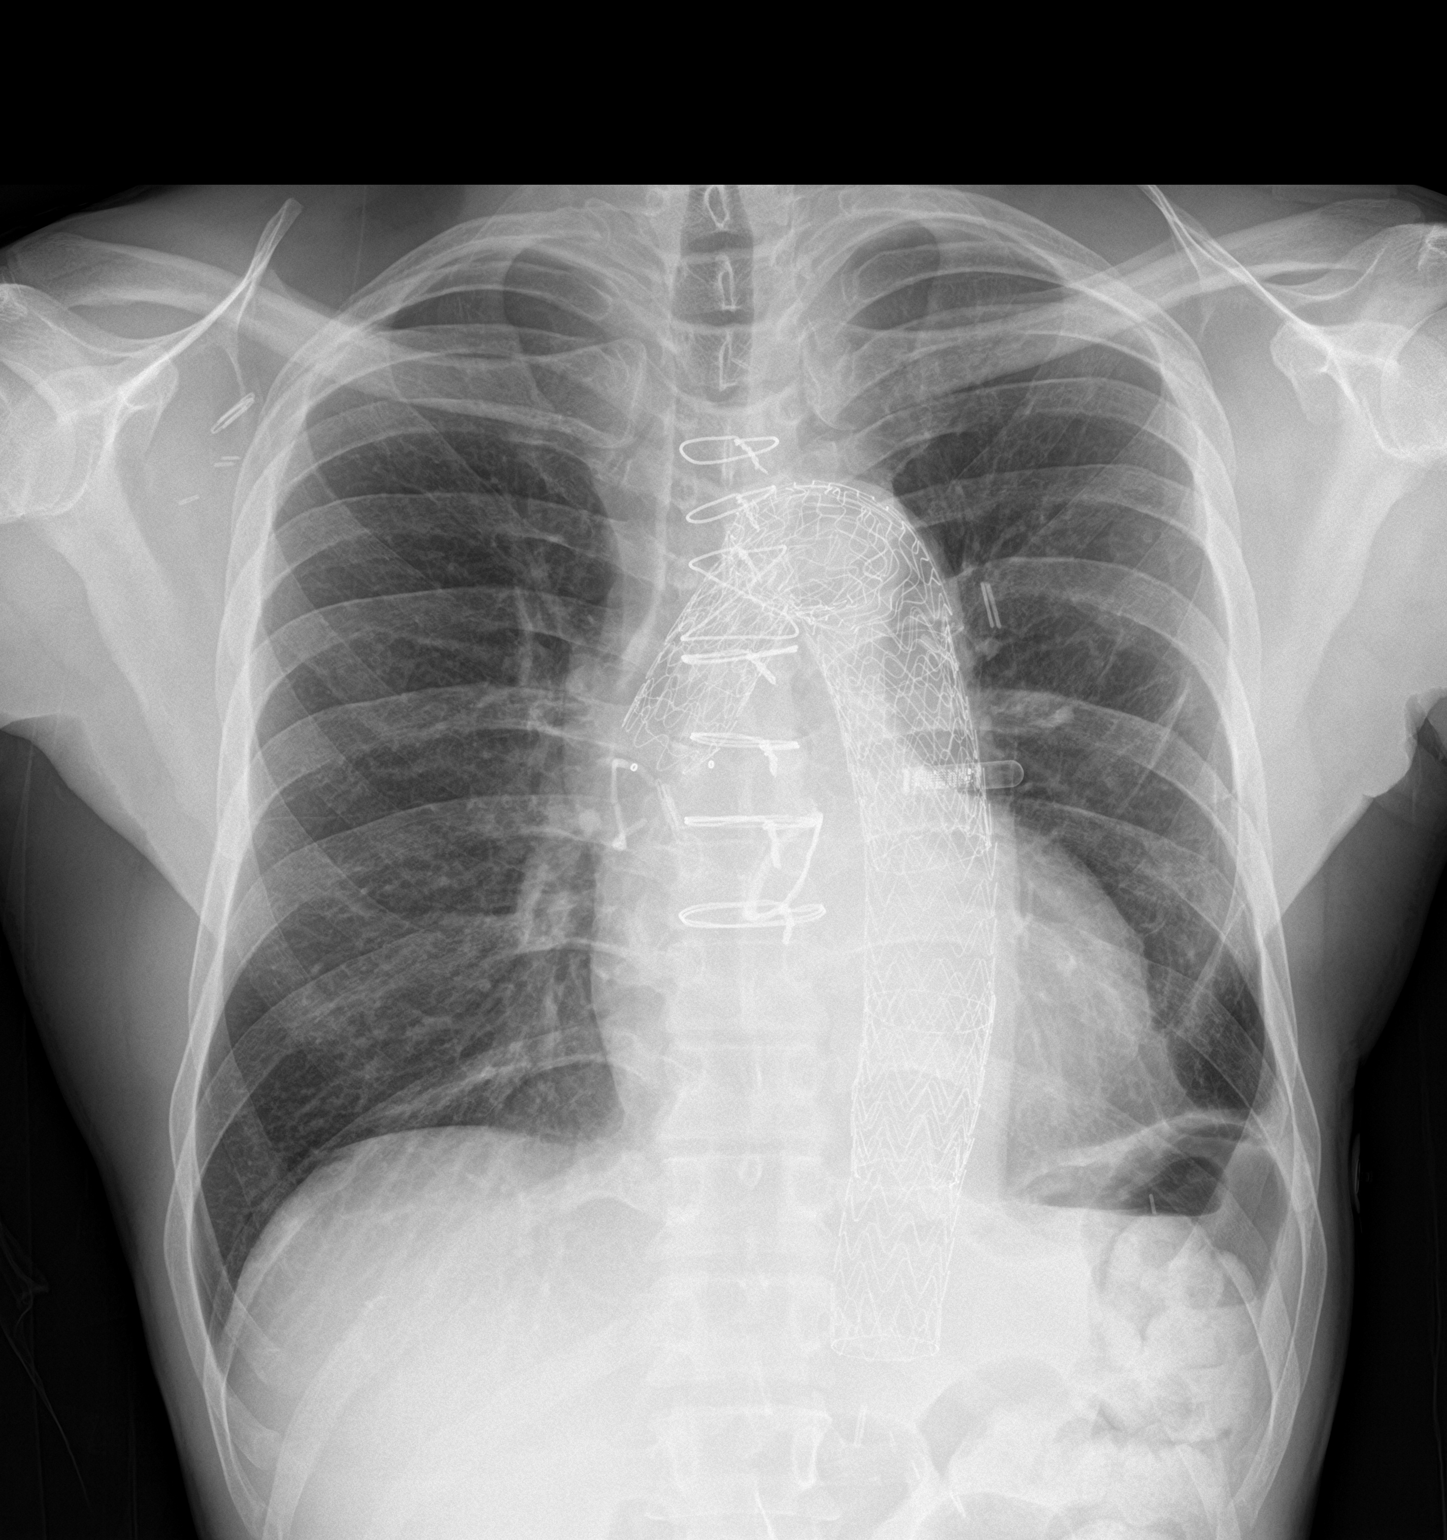

[chest lat]
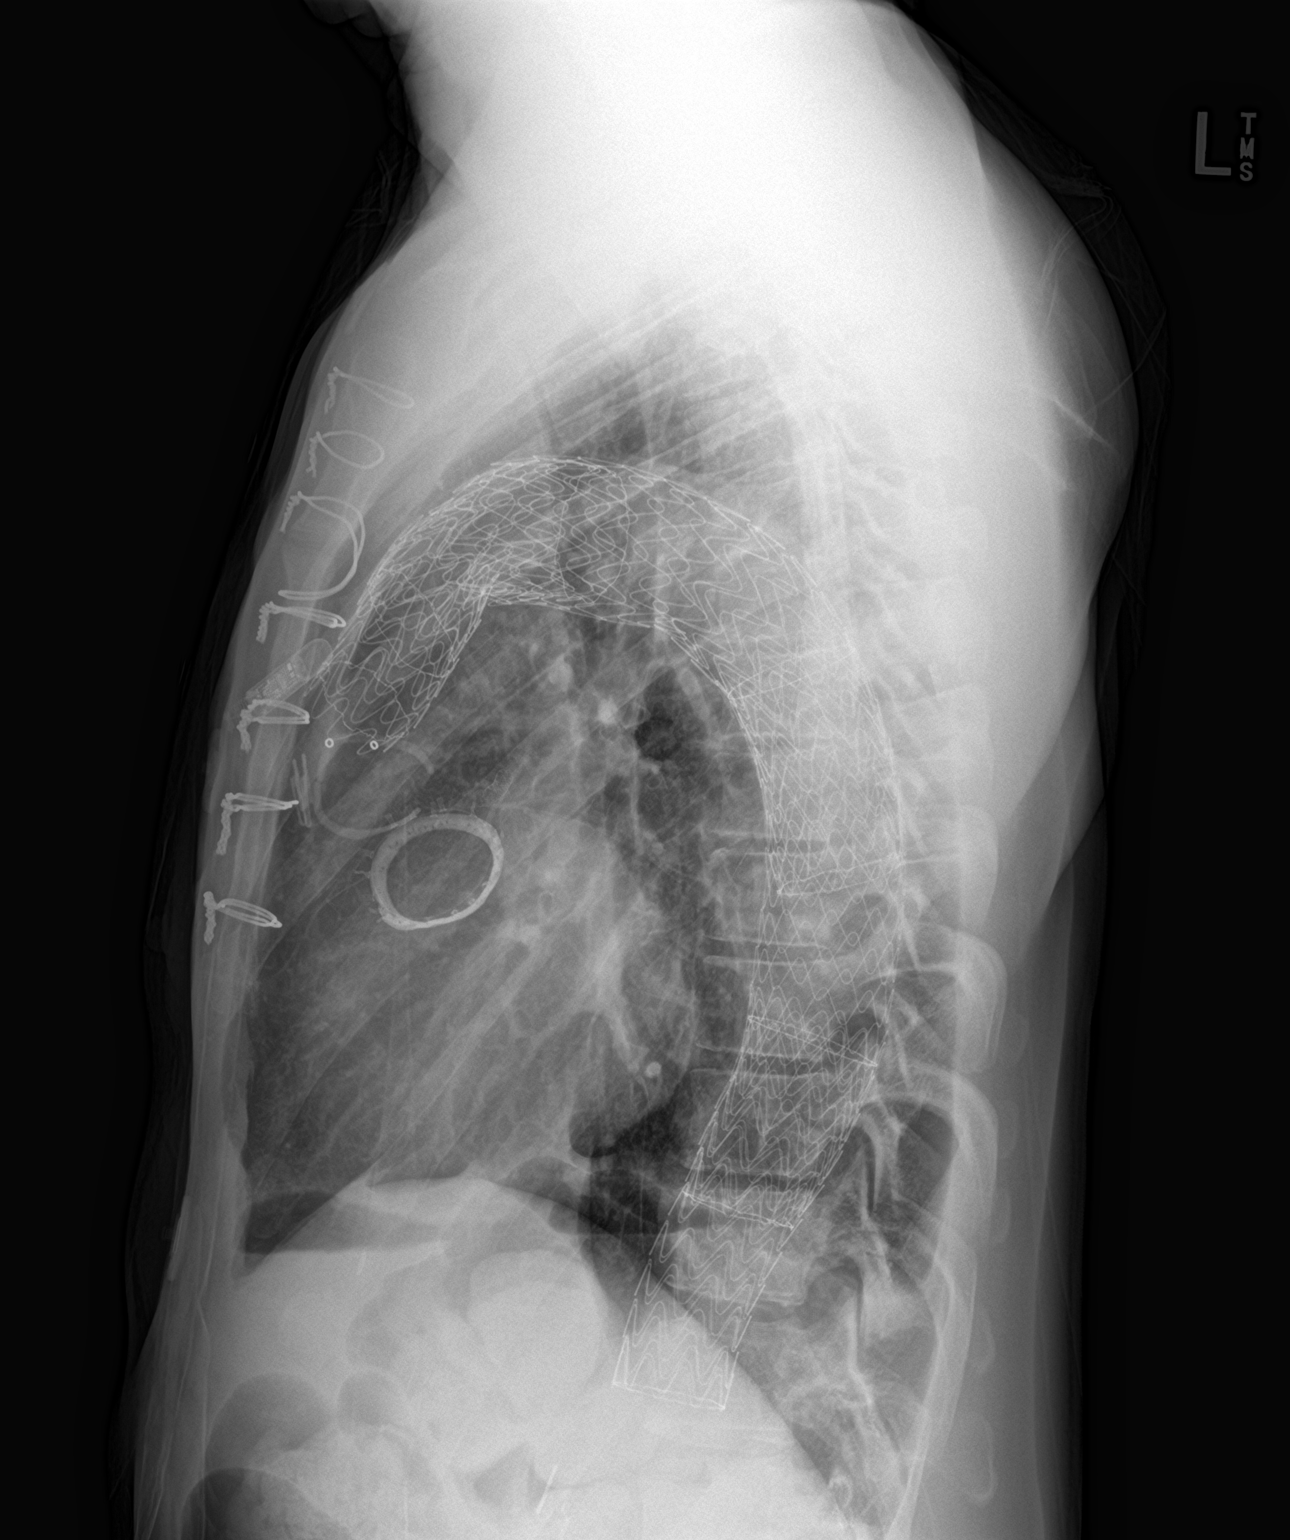

[2 of 2 positions shown; findings below may reference images not displayed]

FINDINGS: Similar appearance of the chest with mildly enlarged cardiac
silhouette, median sternotomy, and surgical aortic annulus and multi
component stent endograft repair of the aorta. Similar mediastinal
contour. Similar chronic blunting of the left costophrenic sulcus,
likely related to scar. No pneumothorax.
IMPRESSION: 1. No evidence of acute cardiopulmonary disease.
2. Similar cardiomegaly with extensive postoperative changes related
to aortic annulus and multi component stent endograft repair of the
aorta.

## 2022-09-25 ENCOUNTER — Telehealth (HOSPITAL_COMMUNITY): Payer: Self-pay | Admitting: Cardiology

## 2022-09-25 NOTE — Telephone Encounter (Signed)
Nurse visit EKG 7/15 @ 1015 Pt aware

## 2022-09-25 NOTE — Telephone Encounter (Signed)
Pt called with concerns of flutters and increased SOB -reports flutters x 2 months -accompanied with CP: described as pressure Reports R arm pain  Weight decreased was 160 now 140 Denies edema, HA,dizziness, visual changes or jaw pain  Reports compliance with medication  No recent med changes  Reports medtronic device was explanted a year ago so unable to send remote transmission  APP appt scheduled 7/25 @3   Will forward to provider ir urgent evaluation is needed and or medication adjustments etc prior to appt.

## 2022-09-25 NOTE — Telephone Encounter (Signed)
Would have him get ECG prior to appointment to make sure no atrial fibrillation, etc

## 2022-09-28 ENCOUNTER — Ambulatory Visit (HOSPITAL_COMMUNITY)
Admission: RE | Admit: 2022-09-28 | Discharge: 2022-09-28 | Disposition: A | Discharge status: Home / Self Care | Payer: 59 | Source: Ambulatory Visit | Attending: Cardiology | Admitting: Cardiology

## 2022-09-28 ENCOUNTER — Inpatient Hospital Stay (HOSPITAL_COMMUNITY)
Admission: RE | Admit: 2022-09-28 | Discharge: 2022-09-28 | Disposition: A | Discharge status: Home / Self Care | Payer: 59 | Source: Ambulatory Visit | Attending: Cardiology | Admitting: Cardiology

## 2022-09-28 DIAGNOSIS — I5042 Chronic combined systolic (congestive) and diastolic (congestive) heart failure: Secondary | ICD-10-CM

## 2022-09-28 DIAGNOSIS — R9431 Abnormal electrocardiogram [ECG] [EKG]: Secondary | ICD-10-CM | POA: Insufficient documentation

## 2022-09-28 DIAGNOSIS — I5022 Chronic systolic (congestive) heart failure: Secondary | ICD-10-CM | POA: Diagnosis present

## 2022-10-06 NOTE — Progress Notes (Signed)
Date:  10/08/2022   ID:  [name], DOB 1970/10/17, MRN [id number]   Provider location: [facility] Type of Visit: Established patient   PCP:  Loura Back, NP  Cardiologist:  Dr. Shirlee Latch Nephrology: [facility].    History of Present Illness: [name] is a 52 y.o. male who has a history of CAD, NSTEMI 11/2017,  smoking, CKD Stage III, HTN, AAA repair 2016 at [facility] , open repair of TAAA secondary to chronic dissection with multibranch dacron graft 12/2016, CVA 2017, loop recorder, bioprothetic AVR 2016, HLD.    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  The descending thoracic aorta has dilated to 5.1 cm.  Cardiac catheterization in April 2016 demonstrated 60% first diagonal lesion.  He underwent aortic valve replacement with bovine pericardial tissue valve and reconstruction along with aortic root replacement and endovascular repair of the descending thoracic aortic aneurysm at [facility].  He has had intermittent chest discomfort since then.   Admitted in 12/02/2017 chest pain. Had NSTEMI. He had LHC with stent placed proximal ramus.  Repeat cath in 7/20 with nonobstructive disease.   He was admitted in 9/21 with atypical chest pain.  Echo showed EF 60-65% with severe LVH, normal RV, bioprosthetic aortic valve with mean gradient 18 mmHg.  Cardiolite showed no ischemia.   Echoes in 3/22 and 6/22 showed hyperdynamic LV with aortic valve mean gradient > 30. Therefore, TEE was done in 8/22 to assess the bioprosthetic aortic valve.  This showed EF 60-65%, severe LVH, normal RV, bioprosthetic AoV that opened with minimal restriction, mean gradient 20 mmHg in setting of anesthesia and BP control, no AI.  Suspect small/undersized aortic valve.   Patient now has ESRD and is on dialysis.   LHC 11/22 showed small  high 1st diagonal with diffuse severe disease, similar to prior. Otherwise, nonobstructive disease.  Today he returns for HF follow up.Overall feeling fine. SBP chronically 170-200. SBP lower on dialysis day. He now only gets dialysis once a week.Takes him a couple days to recover after dialysis.  Making lots of urine. Has chronic rib pain. Denies SOB/PND/Orthopnea. No chest pain.  Appetite ok. No fever or chills. Dry weight at HD 140 pounds. Taking all medications. Having a hard time getting Repatha on time. Smoking 3 cigarettes per day which is lower than previous.     Labs (10/19): K 4.1, creatinine 1.78, myeloma panel negative Labs (2/20): K 4.2, creatinine 1.75, LDL 128 Labs (7/21): LDL 66 Labs (9/21): K 4.1, creatinine 3.31, hgb 11.9 Labs (10/21): K 4, creatinine 2.96, hgb 12.8 Labs (3/22): LDL 55 Labs (10/22): hgb 10.6 Labs (11/22): K 3.8, hgb 10.6 Labs (3/23): BNP 395, hgb 9.4, LDL 47, HDL 48 Labs (04/2022): K 4.1 Creatinine 7.4     Review of Systems: All systems reviewed and negative except as per HPI.    PMH: 1. H/o ankle fractures bilaterally 2. GERD 3. HTN: Negative urinary catecholeamine collection for pheochromocytoma.  - Renal artery dopplers (3/22): Not suggestive of significant renal artery stenosis.  4. ESRD: Suspect hypertensive nephropathy.  5. Cardiomyopathy: Most likely due to heavy ETOH ingestion and uncontrolled HTN.   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  Lexiscan myoview (4/13): EF 37%, global hypokinesis, no ischemic or infarction.  Echo (12/14) with EF 45-50%, diffuse hypokinesis, moderate LVH, mild MR, moderate AI.  Echo (4/16) with EF 55-60%, severe LVH, mild LV dilation, moderate AI and moderate MR.   - Echo (9/19): EF 35-40% with diffuse hypokinesis, severe LVH, bioprosthetic aortic valve functioning normally.  - PYP scan (1/20): No  evidence for TTR amyloidosis.  - Echo (9/21): EF 60-65%, severe LVH, normal RV, bioprosthetic aortic valve with mean gradient 18 mmHg.  - TEE (8/22): EF 60-65%, severe LVH, normal RV, bioprosthetic AoV that opened with minimal restriction, mean gradient 20 mmHg in setting of anesthesia and BP control, no AI.  Suspect small/undersized aortic valve. 6. ETOH abuse: Quit 3/13.  7. Active smoker. 8. Valvular heart disease: Echo in 3/13 showed moderate to severe eccentric MR and mild to moderate AI with prolapsing left coronary cusp. Echo 12/14 with mild MR and moderate AI.  Echo 4/16 with moderate AI and moderate MR.  - S/p Bentall procedure with bioprosthetic aortic valve 9/16.  - Echoes in 3/22 and 6/22 showed hyperdynamic LV with bioprosthetic aortic valve with mean gradient > 30.  - TEE (8/22): EF 60-65%, severe LVH, normal RV, bioprosthetic AoV that opened with minimal restriction, mean gradient 20 mmHg in setting of anesthesia and BP control, no AI.  Suspect small/undersized aortic valve. 9.  CVA: Has ILR. 10. Type B aortic dissection: Occurred in 2/16, from just distal to the left subclavian.  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    - 10/03/2014 First stage median sternotomy with right axillary cannulation for aortic valved conduit root replacement with coronary reconstruction (button Bentall Procedure), 27 mm Sorin Mitroflow bovine pericardial valved conduit, total arch replacement (24mm Vascutek [place] multibranch arch graft with individual head vessel reimplantation. - Status post  open repair of an Extent III TAA secondary to chronic dissection with a # 22mm Vascutek "Coselli" multibranch Dacron graft with individual celiac, SMA, and bilateral renal artery implantation on 01/04/2017, [facility].  11. CAD: LHC (4/16) with 60% ostial D1.   - NSTEMI 9/19: LHC (9/19) showed 50% proximal LCx, 90% ramus => DES to ramus.  - LHC (7/20): D1 60% stenosis, patent ramus stent.  -  Cardiolite (9/21): Inferior fixed defect, no ischemia.  - LHC (11/22): D1 95% stenosis, patient ramus stent. No interventional targets. 12. Sleep study (10/21) with no significant OSA.   Current Outpatient Medications  Medication Sig Dispense Refill   acetaminophen (TYLENOL) 325 MG tablet Take 650 mg by mouth every 6 (six) hours as needed for moderate pain (for pain or headaches).     albuterol (VENTOLIN HFA) 108 (90 Base) MCG/ACT inhaler Inhale 1 puff into the lungs every 6 (six) hours as needed for wheezing or shortness of breath.     amitriptyline (ELAVIL) 50 MG tablet Take 50 mg by mouth at bedtime.     amLODipine (NORVASC) 10 MG tablet TAKE 1 TABLET(10 MG) BY MOUTH DAILY 90 tablet 3   aspirin EC 81 MG EC tablet Take 1 tablet (81 mg total) by mouth daily. 90 tablet 3   carvedilol (COREG) 25 MG tablet Take 25 mg by mouth 2 (two) times daily with a meal.     cloNIDine (CATAPRES) 0.1 MG tablet Take 0.1 mg by mouth daily.     Evolocumab (REPATHA SURECLICK) 140 MG/ML SOAJ Inject 1 Dose into the skin every 14 (fourteen) days. 2 mL 11   hydrALAZINE (APRESOLINE) 100 MG tablet Take  1 tablet (100 mg total) by mouth 3 (three) times daily. 135 tablet 6   HYDROcodone-acetaminophen (NORCO) 5-325 MG tablet Take 1-2 tablets by mouth every 6 (six) hours as needed. 12 tablet 0   lidocaine-prilocaine (EMLA) cream Apply 1 application. topically as needed (prior to port being accessed). Use on dialysis days (Tues, Thurs and Sat)     sildenafil (VIAGRA) 50 MG tablet Take 1 tablet (50 mg total) by mouth daily as needed for erectile dysfunction. 30 tablet 6   Tiotropium Bromide-Olodaterol (STIOLTO RESPIMAT) 2.5-2.5 MCG/ACT AERS Inhale 2 puffs into the lungs daily. 4 g 0   atorvastatin (LIPITOR) 80 MG tablet Take 80 mg by mouth at bedtime. (Patient not taking: Reported on 10/08/2022)     No current facility-administered medications for this encounter.    Allergies:   Imdur [isosorbide nitrate] and Tramadol    Social History:  The patient  reports that he has been smoking cigarettes. He has a 5.8 pack-year smoking history. He has been exposed to tobacco smoke. He has never used smokeless tobacco. He reports that he does not currently use alcohol after a past usage of about 1.0 standard drink of alcohol per week. He reports current drug use. Drug: Marijuana.   Family History:  The patient's family history includes Colon cancer in his paternal uncle; Diabetes in his maternal aunt; Headache in his sister; Heart attack in his brother; Hypertension in his brother, mother, and another family member; Lung cancer in his maternal uncle; Other in his sister; Stroke in his maternal aunt and maternal uncle; Thyroid disease in his sister.   ROS:  Please see the history of present illness.   All other systems are personally reviewed and negative.   Exam:   BP (!) 178/84   Pulse 87   Wt 66.4 kg (146 lb 6.4 oz)   SpO2 99%   BMI 20.42 kg/m  General:  Well appearing. No resp difficulty HEENT: normal Neck: supple. no JVD. Carotids 2+ bilat; no bruits. No lymphadenopathy or thryomegaly appreciated. Wearing Zio Patch  Cor: PMI nondisplaced. Regular rate & rhythm. No rubs, gallops. RUSB 2/6.  Lungs: clear Abdomen: soft, nontender, nondistended. No hepatosplenomegaly. No bruits or masses. Good bowel sounds. Extremities: no cyanosis, clubbing, rash, edema. LUE AVF Neuro: alert & orientedx3, cranial nerves grossly intact. moves all 4 extremities w/o difficulty. Affect pleasant  Recent Labs: 04/27/2022: ALT 16; B Natriuretic Peptide 198.1; BUN 48; Creatinine, Ser 7.41; Hemoglobin 13.0; Platelets 108; Potassium 4.1; Sodium 140  Personally reviewed   Wt Readings from Last 3 Encounters:  10/08/22 66.4 kg (146 lb 6.4 oz)  04/27/22 70.8 kg (156 lb)  03/30/22 69.3 kg (152 lb 12.8 oz)    ASSESSMENT AND PLAN:  1. CAD: 9/19 NSTEMI with PCI to proximal ramus.  LHC in 7/20 with intervenable disease.  Admission in 9/21 with  Cardiolite showing no ischemia.  He has had long-standing atypical chest pain.  Last cath was in 11/22, showing small high 1st diagonal with diffuse severe disease, similar to prior, patent ramus stent. Otherwise, nonobstructive disease. He has a history of chronic chest pain. No chest pain today.    Unable to tolerate long-acting nitrate. - Continue ASA 81.  - Continue atorvastatin and Repatha.   Having a hard time getting Repatha. Will notify Lipid Pharm D.  - Continue Coreg.   2. 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 Myeloma panel negative and PYP scan  negative, suspect due more likely to HTN than hypertrophic cardiomyopathy. Cannot get cardiac MRI with elevated creatinine. -Echo 2024 normal EF. AV gradient 20 mm hg consistent with previous.   NYHA II. Volume status stable. Getting HD weekly.  - Continue Coreg 25 mg bid.  3. HTN:   Renal artery dopplers in 3/22 were not suggestive of significant renal artery stenosis.  Sleep study in 10/21 was negative. - Elevated. Increase clonidine 0.1 mg twice a day.   - Continue Coreg 25 mg bid.  - Continue hydralazine 100 mg tid.  - Continue amlodipine 10 mg daily.  4. S/P Bioprothetic AVR: Echoes in 3/22 and 6/22 showed hyperdynamic LV with mean aortic valve gradient > 30.  TEE was done to investigate.  This showed bioprosthetic AoV that opened with minimal restriction, mean gradient 20 mmHg in setting of anesthesia and BP control, no AI.  Suspect small/undersized aortic valve (patient-prosthesis mismatch).  - Echo 2024 normal EF. AV gradient 20 mm hg consistent with previous.   - Follow ECHO in 6 months.  5. S/P 2016 and 2018 thoracic aortic aneurysm repair: Followed by Dr. Kizzie Bane at [facility].  CTA at [facility] in 3/23 showed stable repaired thoracic aorta.  - Continue followup screening at [facility].  6. ESRD: Think this was due to controlled HTN. Now on HD weekly.    7. Active smoker:  Discussed smoking cessation.  8. Erectile dysfunction:  prn Viagra but was counseled to avoid NTG when taking Viagra.  9. Palpitations: Now with bothersome palpitations and sensation of heart racing.  EKG 09/28/22 - SR  - Wearing Zio Patch. Results pending.    Follow up with Dr Shirlee Latch in 6 months with an ECHO.   Waneta Martins, NP  10/08/2022  [facility] [address] [facility] V[facility] [place] [address] [phone] (office) [phone] (fax)

## 2022-10-08 ENCOUNTER — Encounter (HOSPITAL_COMMUNITY): Payer: Self-pay

## 2022-10-08 ENCOUNTER — Ambulatory Visit (HOSPITAL_COMMUNITY)
Admission: RE | Admit: 2022-10-08 | Discharge: 2022-10-08 | Disposition: A | Discharge status: Home / Self Care | Payer: 59 | Source: Ambulatory Visit | Attending: Adult Health | Admitting: Adult Health

## 2022-10-08 VITALS — BP 178/84 | HR 87 | Wt 146.4 lb

## 2022-10-08 DIAGNOSIS — I429 Cardiomyopathy, unspecified: Secondary | ICD-10-CM | POA: Diagnosis not present

## 2022-10-08 DIAGNOSIS — Z953 Presence of xenogenic heart valve: Secondary | ICD-10-CM | POA: Diagnosis not present

## 2022-10-08 DIAGNOSIS — E785 Hyperlipidemia, unspecified: Secondary | ICD-10-CM | POA: Diagnosis not present

## 2022-10-08 DIAGNOSIS — I132 Hypertensive heart and chronic kidney disease with heart failure and with stage 5 chronic kidney disease, or end stage renal disease: Secondary | ICD-10-CM | POA: Insufficient documentation

## 2022-10-08 DIAGNOSIS — I252 Old myocardial infarction: Secondary | ICD-10-CM | POA: Insufficient documentation

## 2022-10-08 DIAGNOSIS — K219 Gastro-esophageal reflux disease without esophagitis: Secondary | ICD-10-CM | POA: Diagnosis not present

## 2022-10-08 DIAGNOSIS — I7123 Aneurysm of the descending thoracic aorta, without rupture: Secondary | ICD-10-CM | POA: Diagnosis not present

## 2022-10-08 DIAGNOSIS — Z8673 Personal history of transient ischemic attack (TIA), and cerebral infarction without residual deficits: Secondary | ICD-10-CM | POA: Diagnosis not present

## 2022-10-08 DIAGNOSIS — I5042 Chronic combined systolic (congestive) and diastolic (congestive) heart failure: Secondary | ICD-10-CM | POA: Insufficient documentation

## 2022-10-08 DIAGNOSIS — F17210 Nicotine dependence, cigarettes, uncomplicated: Secondary | ICD-10-CM | POA: Insufficient documentation

## 2022-10-08 DIAGNOSIS — R002 Palpitations: Secondary | ICD-10-CM | POA: Diagnosis not present

## 2022-10-08 DIAGNOSIS — Z79899 Other long term (current) drug therapy: Secondary | ICD-10-CM | POA: Insufficient documentation

## 2022-10-08 DIAGNOSIS — I5022 Chronic systolic (congestive) heart failure: Secondary | ICD-10-CM

## 2022-10-08 DIAGNOSIS — Z8679 Personal history of other diseases of the circulatory system: Secondary | ICD-10-CM | POA: Insufficient documentation

## 2022-10-08 DIAGNOSIS — N186 End stage renal disease: Secondary | ICD-10-CM | POA: Diagnosis not present

## 2022-10-08 DIAGNOSIS — Z7982 Long term (current) use of aspirin: Secondary | ICD-10-CM | POA: Diagnosis not present

## 2022-10-08 DIAGNOSIS — I119 Hypertensive heart disease without heart failure: Secondary | ICD-10-CM

## 2022-10-08 DIAGNOSIS — F1290 Cannabis use, unspecified, uncomplicated: Secondary | ICD-10-CM | POA: Insufficient documentation

## 2022-10-08 DIAGNOSIS — Z720 Tobacco use: Secondary | ICD-10-CM

## 2022-10-08 DIAGNOSIS — G8929 Other chronic pain: Secondary | ICD-10-CM | POA: Diagnosis not present

## 2022-10-08 DIAGNOSIS — R000 Tachycardia, unspecified: Secondary | ICD-10-CM | POA: Insufficient documentation

## 2022-10-08 DIAGNOSIS — Z992 Dependence on renal dialysis: Secondary | ICD-10-CM | POA: Insufficient documentation

## 2022-10-08 DIAGNOSIS — N529 Male erectile dysfunction, unspecified: Secondary | ICD-10-CM | POA: Insufficient documentation

## 2022-10-08 DIAGNOSIS — I2510 Atherosclerotic heart disease of native coronary artery without angina pectoris: Secondary | ICD-10-CM | POA: Insufficient documentation

## 2022-10-08 DIAGNOSIS — I43 Cardiomyopathy in diseases classified elsewhere: Secondary | ICD-10-CM

## 2022-10-08 MED ORDER — CLONIDINE HCL 0.1 MG PO TABS: 0.1000 mg | ORAL_TABLET | Freq: Two times a day (BID) | ORAL | 12 refills | Status: DC

## 2022-10-08 NOTE — Patient Instructions (Addendum)
No Labs done today.   INCREASE Clonidine to 0.1mg  (1 tablet) by mouth 2 times daily.   No other medication changes were made. Please continue all current medications as prescribed.  Your physician recommends that you schedule a follow-up appointment in: 6 months with Dr. Shirlee Latch with an echo prior to your appointment. Please contact our office in November to schedule a January 2025 appointment.   If you have any questions or concerns before your next appointment please send [name] a message through [place] or call our office at [phone].    TO LEAVE A MESSAGE FOR THE NURSE SELECT OPTION 2, PLEASE LEAVE A MESSAGE INCLUDING: YOUR NAME DATE OF BIRTH CALL BACK NUMBER REASON FOR CALL**this is important as we prioritize the call backs  YOU WILL RECEIVE A CALL BACK THE SAME DAY AS LONG AS YOU CALL BEFORE 4:00 PM   Do the following things EVERYDAY: Weigh yourself in the morning before breakfast. Write it down and keep it in a log. Take your medicines as prescribed Eat low salt foods--Limit salt (sodium) to 2000 mg per day.  Stay as active as you can everyday Limit all fluids for the day to less than 2 liters   At the [facility], you and your health needs are our priority. As part of our continuing mission to provide you with exceptional heart care, we have created designated Provider Care Teams. These Care Teams include your primary Cardiologist (physician) and Advanced Practice Providers (APPs- Physician Assistants and Nurse Practitioners) who all work together to provide you with the care you need, when you need it.   You may see any of the following providers on your designated Care Team at your next follow up: Dr Arvilla Meres Dr Marca Ancona Dr. Marcos Eke, NP Robbie Lis, [place] [facility] [place], [place] Brynda Peon, NP Karle Plumber, PharmD   Please be sure to bring in all your medications bottles to every appointment.    Thank  you for choosing [facility]

## 2022-10-09 ENCOUNTER — Telehealth: Payer: Self-pay | Admitting: Pharmacist

## 2022-10-09 DIAGNOSIS — E785 Hyperlipidemia, unspecified: Secondary | ICD-10-CM

## 2022-10-09 DIAGNOSIS — I2510 Atherosclerotic heart disease of native coronary artery without angina pectoris: Secondary | ICD-10-CM

## 2022-10-09 DIAGNOSIS — I25119 Atherosclerotic heart disease of native coronary artery with unspecified angina pectoris: Secondary | ICD-10-CM

## 2022-10-09 NOTE — Telephone Encounter (Signed)
Called patient to f/u on what the issue with Repatha is. LVM for patient to call back.

## 2022-10-09 NOTE — Telephone Encounter (Signed)
-----   Message from CMA Philicia B sent at 10/08/2022  3:15 PM EDT ----- Regarding: Repatha Hey this patient states that he is not receiving his repatha on time. Is this something you can look into?

## 2022-10-27 ENCOUNTER — Other Ambulatory Visit (HOSPITAL_COMMUNITY): Payer: Self-pay

## 2022-10-27 NOTE — Telephone Encounter (Signed)
  Per test claim: The current 84 day co-pay is, $0.  No PA needed at this time. This test claim was processed through [facility]- copay amounts may vary at other pharmacies due to pharmacy/plan contracts, or as the patient moves through the different stages of their insurance plan.

## 2022-10-28 MED ORDER — REPATHA SURECLICK 140 MG/ML ~~LOC~~ SOAJ: 140.0000 mg | SUBCUTANEOUS | 3 refills | Status: AC

## 2022-10-28 NOTE — Telephone Encounter (Signed)
I have tried to call the patient 3 times. I have left 2 messages. There does not appear to be an issue with the Repatha. I have sent in a 90 DS to pharmacy. Per test claim can be processed.

## 2022-11-17 ENCOUNTER — Telehealth (HOSPITAL_COMMUNITY): Payer: Self-pay | Admitting: Cardiology

## 2022-11-17 NOTE — Telephone Encounter (Signed)
Patient called to request an appointment C/o CP and pressure Arm pain that radiates into jaw and neck  Reports symptoms off and on x 3 weeks increased past couple of days.  -slurred speech at the time of call -pt currently at dialysis unable to make add on appt 9/3 @ 12 or 3  Above reviewed with Alferd Apa Report to ER for further evaluation

## 2022-12-10 ENCOUNTER — Other Ambulatory Visit: Payer: Self-pay | Admitting: *Deleted

## 2022-12-10 DIAGNOSIS — N186 End stage renal disease: Secondary | ICD-10-CM

## 2022-12-18 ENCOUNTER — Ambulatory Visit (INDEPENDENT_AMBULATORY_CARE_PROVIDER_SITE_OTHER): Payer: 59 | Admitting: Physician Assistant

## 2022-12-18 ENCOUNTER — Ambulatory Visit (HOSPITAL_COMMUNITY)
Admission: RE | Admit: 2022-12-18 | Discharge: 2022-12-18 | Disposition: A | Discharge status: Home / Self Care | Payer: 59 | Source: Ambulatory Visit | Attending: Vascular Surgery | Admitting: Vascular Surgery

## 2022-12-18 VITALS — BP 164/88 | HR 62 | Temp 98.3°F | Resp 18 | Ht 71.0 in | Wt 145.2 lb

## 2022-12-18 DIAGNOSIS — Z992 Dependence on renal dialysis: Secondary | ICD-10-CM | POA: Diagnosis not present

## 2022-12-18 DIAGNOSIS — N186 End stage renal disease: Secondary | ICD-10-CM

## 2022-12-18 DIAGNOSIS — R221 Localized swelling, mass and lump, neck: Secondary | ICD-10-CM | POA: Diagnosis not present

## 2022-12-18 NOTE — Progress Notes (Signed)
[facility] [place] HISTORY AND PHYSICAL   History of Present Illness:  Patient is a 52 y.o. year old male who presents for exam of his left UE AV fistula with a 2 year history of left jugular venous distension and left eye bulging.  He has a history of second stage basilic transposition by Dr. Lenell Antu on 10/09/20.  He states since the fistula was first used he has had the Jugular distension and an initial episode of left arm to facial swelling.  He recently underwent veno plasty by the interventional nephrologist.  I do not have the report.  He states he has had some prolong bleeding after HD in the past couple of months.  He also has a history of TAAA repair with visceral debranching and bilateral renal implantation at [facility].     He denies problems on HD recently and only dialysis 1 day a week on Tuesdays.  He denies SOB, CP or steal symptoms on the left UE.  No edema on the left UE since the original use of the fistula  2 years ago.  He denies vision changes or loss.    He is medically managed for hypertension and  History of aortic valve replacement with bioprosthetic valve with Statin and ASA daily.     Past Medical History:  Diagnosis Date   Adenomatous colon polyp 08/2015   Anxiety    Aortic disease (HCC)    Aortic dissection (HCC)    a. admx 04/2014 >> L renal infarct; a/c renal failure >> b.  s/p Bioprosthetic Bent[place] and total arch replacement and staged endovascular repair of descending aortic aneurysm ([facility] - Dr. Kizzie Bane)   CAD (coronary artery disease)    a. LHC 4/16:  oD1 60%   Cardiomyopathy (HCC)    a. non-ischemic - probably related to untreated HTN and ETOH abuse - Echo 3/13 with EF 35-40% >> b. Echo 4/16: Severe LVH, EF 55-60%, moderate AI, moderate MR, mild LAE, trivial effusion, known type B dissection with communication between true and false lumens with suprasternal images suggesting dissection plane may propagate to at least left subclavian takeoff, root  above aortic valve okay     Chronic abdominal pain    Chronic combined systolic and diastolic congestive heart failure (HCC)    a. 05/2011: Adm with pulm edema/HTN urgency, EF 35-40% with diffuse hypokinesis and moderate to severe mitral regurgitation. Cardiomyopathy likely due to uncontrolled HTN and ETOH abuse - cath deferred due to renal insufficiency (felt due to uncontrolled HTN). bElzie Rings MV 06/2011: EF 37% and no ischemia or infarction. c. EF 45-50% by echo 01/2012.   Chronic sinusitis    CKD (chronic kidney disease)    dialysis m,t,th,fri   DDD (degenerative disc disease), lumbar    Depression    Descending thoracic aortic aneurysm (HCC)    Dissecting aneurysm of thoracic aorta (HCC)    ESRD (end stage renal disease) (HCC)    dialysis 3 x week, 8 months ago started   ETOH abuse    a. Reported to have quit 05/2011.   Frequent headaches    GERD (gastroesophageal reflux disease)    Headache(784.0)    "q other day" (08/08/2013)   Heart murmur    Hemorrhoid thrombosis    History of echocardiogram    Echo 1/17:  Severe LVH, EF 55-60%, no RWMA, Gr 2 DD, AVR ok, mild to mod MR, mild LAE, mild reduced RVSF, mod RAE   History of medication noncompliance  HYPERLIPIDEMIA    Hypertension    a. Hx of HTN urgency secondary to noncompliance. b. urinary metanephrine and catecholeamine levels normal 2013.  c. Renal art [name] 1/16:  No evidence of renal artery stenosis noted bilaterally.   INGUINAL HERNIA    Pneumonia ~ 2013   Renal insufficiency    Serrated adenoma of colon 08/2015   Stroke [facility])    Tobacco abuse    Valvular heart disease    a. Echo 05/2011: moderate to severe eccentric MR and mild to moderate AI with prolapsing left coronary cusp. b. Echo 01/2012: mild-mod AI, mild dilitation of aortic root, mild MR.;  c. Echo 1/16: Severe LVH consistent with hypertrophic cardio myopathy, EF 50%, no RWMA, mod AI, mild MR, mild RAE, dilated Ao root (40 mm);     Past Surgical History:   Procedure Laterality Date   A/V FISTULAGRAM Left 03/07/2021   Procedure: A/V FISTULAGRAM;  Surgeon: Chuck Hint, MD;  Location: [facility] INVASIVE CV LAB;  Service: Cardiovascular;  Laterality: Left;   A/V FISTULAGRAM Left 07/18/2021   Procedure: A/V Fistulagram;  Surgeon: Chuck Hint, MD;  Location: [facility] INVASIVE CV LAB;  Service: Cardiovascular;  Laterality: Left;   ANKLE SURGERY Bilateral    Fractures bilaterally   AORTIC VALVE SURGERY  09/2014   AV FISTULA PLACEMENT Left 09/09/2020   Procedure: LEFT ARM ARTERIOVENOUS (AV) FISTULA CREATION;  Surgeon: Leonie Douglas, MD;  Location: [facility] OR;  Service: Vascular;  Laterality: Left;   BASCILIC VEIN TRANSPOSITION Left 10/09/2020   Procedure: LEFT SECOND STAGE BASILIC VEIN TRANSPOSITION;  Surgeon: Leonie Douglas, MD;  Location: [facility] OR;  Service: Vascular;  Laterality: Left;  PERIPHERAL NERVE BLOCK   CORONARY ANGIOGRAPHY N/A 12/06/2017   Procedure: CORONARY ANGIOGRAPHY;  Surgeon: Lyn Records, MD;  Location: [facility] INVASIVE CV LAB;  Service: Cardiovascular;  Laterality: N/A;   CORONARY ANGIOGRAPHY N/A 09/26/2018   Procedure: CORONARY ANGIOGRAPHY;  Surgeon: Yvonne Kendall, MD;  Location: [facility] INVASIVE CV LAB;  Service: Cardiovascular;  Laterality: N/A;   CORONARY STENT INTERVENTION N/A 12/10/2017   Procedure: CORONARY STENT INTERVENTION;  Surgeon: Lennette Bihari, MD;  Location: [facility] INVASIVE CV LAB;  Service: Cardiovascular;  Laterality: N/A;   FOOT FRACTURE SURGERY Bilateral 2004-2010   "got pins in both of them"   HEMORRHOID SURGERY N/A 06/15/2015   Procedure: HEMORRHOIDECTOMY;  Surgeon: Almond Lint, MD;  Location: [facility] OR;  Service: General;  Laterality: N/A;   INGUINAL HERNIA REPAIR Right ~ 1996   INSERTION OF DIALYSIS CATHETER N/A 09/09/2020   Procedure: INSERTION OF TUNNELED DIALYSIS PALINDROME 19cm CATHETER;  Surgeon: Leonie Douglas, MD;  Location: [facility] OR;  Service: Vascular;  Laterality: N/A;   LEFT HEART CATH AND CORONARY ANGIOGRAPHY  N/A 02/05/2021   Procedure: LEFT HEART CATH AND CORONARY ANGIOGRAPHY;  Surgeon: Laurey Morale, MD;  Location: [facility] INVASIVE CV LAB;  Service: Cardiovascular;  Laterality: N/A;   LEFT HEART CATHETERIZATION WITH CORONARY ANGIOGRAM N/A 06/21/2014   Procedure: LEFT HEART CATHETERIZATION WITH CORONARY ANGIOGRAM;  Surgeon: Laurey Morale, MD;  Location: [facility] CATH LAB;  Service: Cardiovascular;  Laterality: N/A;   LOOP RECORDER INSERTION N/A 06/19/2016   Procedure: Loop Recorder Insertion;  Surgeon: [facility] Salvia, MD;  Location: [facility] INVASIVE CV LAB;  Service: Cardiovascular;  Laterality: N/A;   PERIPHERAL VASCULAR BALLOON ANGIOPLASTY  03/07/2021   Procedure: PERIPHERAL VASCULAR BALLOON ANGIOPLASTY;  Surgeon: Chuck Hint, MD;  Location: [facility] INVASIVE CV LAB;  Service: Cardiovascular;;   PERIPHERAL VASCULAR BALLOON  ANGIOPLASTY  07/18/2021   Procedure: PERIPHERAL VASCULAR BALLOON ANGIOPLASTY;  Surgeon: Chuck Hint, MD;  Location: [facility] INVASIVE CV LAB;  Service: Cardiovascular;;   TEE WITHOUT CARDIOVERSION N/A 03/12/2016   Procedure: TRANSESOPHAGEAL ECHOCARDIOGRAM (TEE);  Surgeon: Thurmon Fair, MD;  Location: [facility] ENDOSCOPY;  Service: Cardiovascular;  Laterality: N/A;   TEE WITHOUT CARDIOVERSION N/A 10/16/2020   Procedure: TRANSESOPHAGEAL ECHOCARDIOGRAM (TEE);  Surgeon: Laurey Morale, MD;  Location: [facility] ENDOSCOPY;  Service: Cardiovascular;  Laterality: N/A;   UPPER EXTREMITY VENOGRAPHY Right 07/18/2021   Procedure: UPPER EXTREMITY VENOGRAPHY;  Surgeon: Chuck Hint, MD;  Location: [facility] INVASIVE CV LAB;  Service: Cardiovascular;  Laterality: Right;     Social History Social History   Tobacco Use   Smoking status: Some Days    Current packs/day: 0.25    Average packs/day: 0.3 packs/day for 23.0 years (5.8 ttl pk-yrs)    Types: Cigarettes    Passive exposure: Current   Smokeless tobacco: Never   Tobacco comments:    3 to 4 per day  Substance Use Topics   Alcohol use: Not Currently     Alcohol/week: 1.0 standard drink of alcohol    Types: 1 Cans of beer per week    Comment: On special occasion   Drug use: Yes    Types: Marijuana    Comment: 2 times/week - last use 10/06/20    Family History Family History  Problem Relation Age of Onset   Hypertension Mother    Hypertension Other    Colon cancer Paternal Uncle    Stroke Maternal Aunt    Heart attack Brother    Hypertension Brother    Diabetes Maternal Aunt    Lung cancer Maternal Uncle    Stroke Maternal Uncle    Other Sister        "breathing machine at night"   Headache Sister    Thyroid disease Sister    Throat cancer Neg Hx    Pancreatic cancer Neg Hx    Esophageal cancer Neg Hx    Kidney disease Neg Hx    Liver disease Neg Hx     Allergies  Allergies  Allergen Reactions   Imdur [Isosorbide Nitrate] Other (See Comments)    Headaches -- patient instructed to continue taking    Tramadol Other (See Comments)    Headaches      Current Outpatient Medications  Medication Sig Dispense Refill   acetaminophen (TYLENOL) 325 MG tablet Take 650 mg by mouth every 6 (six) hours as needed for moderate pain (for pain or headaches).     albuterol (VENTOLIN HFA) 108 (90 Base) MCG/ACT inhaler Inhale 1 puff into the lungs every 6 (six) hours as needed for wheezing or shortness of breath.     amitriptyline (ELAVIL) 50 MG tablet Take 50 mg by mouth at bedtime.     amLODipine (NORVASC) 10 MG tablet TAKE 1 TABLET(10 MG) BY MOUTH DAILY 90 tablet 3   aspirin EC 81 MG EC tablet Take 1 tablet (81 mg total) by mouth daily. 90 tablet 3   atorvastatin (LIPITOR) 80 MG tablet Take 80 mg by mouth at bedtime.     carvedilol (COREG) 25 MG tablet Take 25 mg by mouth 2 (two) times daily with a meal.     cloNIDine (CATAPRES) 0.1 MG tablet Take 1 tablet (0.1 mg total) by mouth 2 (two) times daily. 60 tablet 12   Evolocumab (REPATHA SURECLICK) 140 MG/ML SOAJ Inject 140 mg into the skin every 14 (fourteen) days.  6 mL 3    hydrALAZINE (APRESOLINE) 100 MG tablet Take 1 tablet (100 mg total) by mouth 3 (three) times daily. 135 tablet 6   HYDROcodone-acetaminophen (NORCO) 5-325 MG tablet Take 1-2 tablets by mouth every 6 (six) hours as needed. 12 tablet 0   lidocaine-prilocaine (EMLA) cream Apply 1 application. topically as needed (prior to port being accessed). Use on dialysis days (Tues, Thurs and Sat)     sildenafil (VIAGRA) 50 MG tablet Take 1 tablet (50 mg total) by mouth daily as needed for erectile dysfunction. 30 tablet 6   Tiotropium Bromide-Olodaterol (STIOLTO RESPIMAT) 2.5-2.5 MCG/ACT AERS Inhale 2 puffs into the lungs daily. 4 g 0   No current facility-administered medications for this visit.    ROS:   General:  No weight loss, Fever, chills  HEENT: No recent headaches, no nasal bleeding, no visual changes, no sore throat  Neurologic: No dizziness, blackouts, seizures. No recent symptoms of stroke or mini- stroke. No recent episodes of slurred speech, or temporary blindness.  Cardiac: No recent episodes of chest pain/pressure, no shortness of breath at rest.  No shortness of breath with exertion.  Denies history of atrial fibrillation or irregular heartbeat  Vascular: No history of rest pain in feet.  No history of claudication.  No history of non-healing ulcer, No history of DVT   Pulmonary: No home oxygen, no productive cough, no hemoptysis,  No asthma or wheezing  Musculoskeletal:  [ ]  Arthritis, [ ]  Low back pain,  [ ]  Joint pain  Hematologic:No history of hypercoagulable state.  No history of easy bleeding.  No history of anemia  Gastrointestinal: No hematochezia or melena,  No gastroesophageal reflux, no trouble swallowing  Urinary: [ ]  chronic Kidney disease, [ ]  on HD - [ ]  MWF or [ ]  TTHS, [ ]  Burning with urination, [ ]  Frequent urination, [ ]  Difficulty urinating;   Skin: No rashes  Psychological: No history of anxiety,  No history of depression   Physical Examination  Vitals:    12/18/22 0921  BP: (!) 164/88  Pulse: 62  Resp: 18  Temp: 98.3 F (36.8 C)  TempSrc: Temporal  SpO2: (!) 77%  Weight: 145 lb 3.2 oz (65.9 kg)  Height: 5\' 11"  (1.803 m)    Body mass index is 20.25 kg/m.  General:  Alert and oriented, no acute distress HEENT: Normal, left eye bulging Neck: No bruit, Positive   JVD          Audible venous thrill in the jugular on the left neck Pulmonary: Clear to auscultation bilaterally Cardiac: Regular Rate and Rhythm without murmur Gastrointestinal: Soft, non-tender, non-distended, no mass, no scars Skin: No rash Extremity Pulses:  2+ radial, brachial pulses bilaterally Musculoskeletal: No deformity or edema  Neurologic: Upper and lower extremity motor 5/5 and symmetric  DATA:  Findings:  +--------------------+----------+-----------------+--------+  AVF                PSV (cm/s)Flow Vol (mL/min)Comments  +--------------------+----------+-----------------+--------+  Native artery inflow   208          2399                 +--------------------+----------+-----------------+--------+  AVF Anastomosis        205                               +--------------------+----------+-----------------+--------+     +-------------+----------+-------------+-----------+--------+  OUTFLOW VEIN PSV (cm/s)Diameter (cm)Depth (cm) Describe  +-------------+----------+-------------+-----------+--------+  Axillary vein    92        1.35        0.25              +-------------+----------+-------------+-----------+--------+  Prox UA          97        1.63        0.17              +-------------+----------+-------------+-----------+--------+  Mid UA       320 / 192  0.58 / 1.87 0.34 / 0.22          +-------------+----------+-------------+-----------+--------+  Dist UA         586        0.63        0.22              +-------------+----------+-------------+-----------+--------+        Summary:  Patent  aneurysmal Basilic vein transposition.    ASSESSMENT/PLAN: ESRD with working left UE AV fistula He has had some prolong bleeding post HD in the past few months.  He has developed increased left jugular enlargement and some left eye bulging over the past year.  I will schedule him for left UE fistulogram with possible intervention to look for central venous stenosis verses thoracic outlet syndrome.     Of note he did undergo a previous fistulogram for poorly functioning fistula on 03/07/21 by Dr. Edilia Bo requiring : Angioplasty of left brachiocephalic vein stenosis (10 x 60 Mustang balloon) Angioplasty of the left subclavian vein with a 10 x 60 Mustang balloon Angioplasty of the left basilic vein transposition with an 8 x 10 Mustang balloon and a 7 x 2 Mustang balloon   Mosetta Pigeon PA-C [facility] [place] Office: [phone]  MD in clinic [place]

## 2022-12-19 ENCOUNTER — Encounter (HOSPITAL_BASED_OUTPATIENT_CLINIC_OR_DEPARTMENT_OTHER): Payer: Self-pay | Admitting: Emergency Medicine

## 2022-12-19 ENCOUNTER — Emergency Department (HOSPITAL_BASED_OUTPATIENT_CLINIC_OR_DEPARTMENT_OTHER): Payer: 59

## 2022-12-19 ENCOUNTER — Other Ambulatory Visit: Payer: Self-pay

## 2022-12-19 ENCOUNTER — Observation Stay (HOSPITAL_BASED_OUTPATIENT_CLINIC_OR_DEPARTMENT_OTHER)
Admission: EM | Admit: 2022-12-19 | Discharge: 2022-12-20 | Discharge status: Against Medical Advice | Payer: 59 | Attending: Family Medicine | Admitting: Family Medicine

## 2022-12-19 DIAGNOSIS — Z79899 Other long term (current) drug therapy: Secondary | ICD-10-CM | POA: Insufficient documentation

## 2022-12-19 DIAGNOSIS — N186 End stage renal disease: Secondary | ICD-10-CM | POA: Diagnosis not present

## 2022-12-19 DIAGNOSIS — I5042 Chronic combined systolic (congestive) and diastolic (congestive) heart failure: Secondary | ICD-10-CM | POA: Insufficient documentation

## 2022-12-19 DIAGNOSIS — Z992 Dependence on renal dialysis: Secondary | ICD-10-CM | POA: Insufficient documentation

## 2022-12-19 DIAGNOSIS — R42 Dizziness and giddiness: Principal | ICD-10-CM | POA: Insufficient documentation

## 2022-12-19 DIAGNOSIS — Z951 Presence of aortocoronary bypass graft: Secondary | ICD-10-CM | POA: Insufficient documentation

## 2022-12-19 DIAGNOSIS — F17210 Nicotine dependence, cigarettes, uncomplicated: Secondary | ICD-10-CM | POA: Insufficient documentation

## 2022-12-19 DIAGNOSIS — Z8673 Personal history of transient ischemic attack (TIA), and cerebral infarction without residual deficits: Secondary | ICD-10-CM | POA: Insufficient documentation

## 2022-12-19 DIAGNOSIS — Z955 Presence of coronary angioplasty implant and graft: Secondary | ICD-10-CM | POA: Diagnosis not present

## 2022-12-19 DIAGNOSIS — J449 Chronic obstructive pulmonary disease, unspecified: Secondary | ICD-10-CM | POA: Diagnosis not present

## 2022-12-19 DIAGNOSIS — Z7982 Long term (current) use of aspirin: Secondary | ICD-10-CM | POA: Insufficient documentation

## 2022-12-19 DIAGNOSIS — I2510 Atherosclerotic heart disease of native coronary artery without angina pectoris: Secondary | ICD-10-CM | POA: Insufficient documentation

## 2022-12-19 DIAGNOSIS — I132 Hypertensive heart and chronic kidney disease with heart failure and with stage 5 chronic kidney disease, or end stage renal disease: Secondary | ICD-10-CM | POA: Insufficient documentation

## 2022-12-19 LAB — CBC
HCT: 30.5 % — ABNORMAL LOW (ref 39.0–52.0)
HCT: 35.8 % — ABNORMAL LOW (ref 39.0–52.0)
Hemoglobin: 10.3 g/dL — ABNORMAL LOW (ref 13.0–17.0)
Hemoglobin: 11.4 g/dL — ABNORMAL LOW (ref 13.0–17.0)
MCH: 32.8 pg (ref 26.0–34.0)
MCH: 30.7 pg (ref 26.0–34.0)
MCHC: 33.8 g/dL (ref 30.0–36.0)
MCHC: 31.8 g/dL (ref 30.0–36.0)
MCV: 97.1 fL (ref 80.0–100.0)
MCV: 96.5 fL (ref 80.0–100.0)
Platelets: 74 10*3/uL — ABNORMAL LOW (ref 150–400)
Platelets: 80 10*3/uL — ABNORMAL LOW (ref 150–400)
RBC: 3.14 MIL/uL — ABNORMAL LOW (ref 4.22–5.81)
RBC: 3.71 MIL/uL — ABNORMAL LOW (ref 4.22–5.81)
RDW: 12.8 % (ref 11.5–15.5)
RDW: 12.8 % (ref 11.5–15.5)
WBC: 3.9 10*3/uL — ABNORMAL LOW (ref 4.0–10.5)
WBC: 3.8 10*3/uL — ABNORMAL LOW (ref 4.0–10.5)
nRBC: 0 % (ref 0.0–0.2)
nRBC: 0 % (ref 0.0–0.2)

## 2022-12-19 LAB — BASIC METABOLIC PANEL
Anion gap: 10 (ref 5–15)
BUN: 65 mg/dL — ABNORMAL HIGH (ref 6–20)
CO2: 27 mmol/L (ref 22–32)
Calcium: 9.6 mg/dL (ref 8.9–10.3)
Chloride: 104 mmol/L (ref 98–111)
Creatinine, Ser: 9.02 mg/dL — ABNORMAL HIGH (ref 0.61–1.24)
GFR, Estimated: 7 mL/min — ABNORMAL LOW (ref >60)
Glucose, Bld: 78 mg/dL (ref 70–99)
Potassium: 3.8 mmol/L (ref 3.5–5.1)
Sodium: 141 mmol/L (ref 135–145)

## 2022-12-19 LAB — CREATININE, SERUM
Creatinine, Ser: 8.75 mg/dL — ABNORMAL HIGH (ref 0.61–1.24)
GFR, Estimated: 7 mL/min — ABNORMAL LOW (ref >60)

## 2022-12-19 LAB — HIV ANTIBODY (ROUTINE TESTING W REFLEX): HIV Screen 4th Generation wRfx: NONREACTIVE

## 2022-12-19 LAB — CBG MONITORING, ED: Glucose-Capillary: 86 mg/dL (ref 70–99)

## 2022-12-19 MED ORDER — CLOPIDOGREL BISULFATE 75 MG PO TABS
75.0000 mg | ORAL_TABLET | Freq: Every day | ORAL | Status: DC
  Administered 2022-12-19 – 2022-12-20 (×2): 75 mg via ORAL
  Filled 2022-12-19 (×2): qty 1

## 2022-12-19 MED ORDER — ATORVASTATIN CALCIUM 80 MG PO TABS
80.0000 mg | ORAL_TABLET | Freq: Every day | ORAL | Status: DC
  Administered 2022-12-19 – 2022-12-20 (×2): 80 mg via ORAL
  Filled 2022-12-19 (×2): qty 1

## 2022-12-19 MED ORDER — LABETALOL HCL 5 MG/ML IV SOLN: 5.0000 mg | INTRAVENOUS | Status: DC | PRN

## 2022-12-19 MED ORDER — PROCHLORPERAZINE EDISYLATE 10 MG/2ML IJ SOLN: 5.0000 mg | Freq: Four times a day (QID) | INTRAMUSCULAR | Status: DC | PRN

## 2022-12-19 MED ORDER — IOHEXOL 350 MG/ML SOLN
75.0000 mL | Freq: Once | INTRAVENOUS | Status: AC | PRN
  Administered 2022-12-19: 75 mL via INTRAVENOUS

## 2022-12-19 MED ORDER — ACETAMINOPHEN 325 MG PO TABS
650.0000 mg | ORAL_TABLET | Freq: Four times a day (QID) | ORAL | Status: DC | PRN
  Administered 2022-12-20: 650 mg via ORAL
  Filled 2022-12-19 (×2): qty 2

## 2022-12-19 MED ORDER — POLYETHYLENE GLYCOL 3350 17 G PO PACK: 17.0000 g | PACK | Freq: Every day | ORAL | Status: DC | PRN

## 2022-12-19 MED ORDER — HEPARIN SODIUM (PORCINE) 5000 UNIT/ML IJ SOLN
5000.0000 [IU] | Freq: Three times a day (TID) | INTRAMUSCULAR | Status: DC
  Administered 2022-12-19 – 2022-12-20 (×3): 5000 [IU] via SUBCUTANEOUS
  Filled 2022-12-19 (×4): qty 1

## 2022-12-19 MED ORDER — UMECLIDINIUM BROMIDE 62.5 MCG/ACT IN AEPB
1.0000 | INHALATION_SPRAY | Freq: Every day | RESPIRATORY_TRACT | Status: DC
  Filled 2022-12-19: qty 7

## 2022-12-19 MED ORDER — MELATONIN 5 MG PO TABS
5.0000 mg | ORAL_TABLET | Freq: Every evening | ORAL | Status: DC | PRN
  Administered 2022-12-20: 5 mg via ORAL
  Filled 2022-12-19: qty 1

## 2022-12-19 MED ORDER — ARFORMOTEROL TARTRATE 15 MCG/2ML IN NEBU
15.0000 ug | INHALATION_SOLUTION | Freq: Two times a day (BID) | RESPIRATORY_TRACT | Status: DC
  Filled 2022-12-19 (×3): qty 2

## 2022-12-19 MED ORDER — ASPIRIN 81 MG PO TBEC
81.0000 mg | DELAYED_RELEASE_TABLET | Freq: Every day | ORAL | Status: DC
  Administered 2022-12-20: 81 mg via ORAL
  Filled 2022-12-19: qty 1

## 2022-12-19 MED ORDER — MECLIZINE HCL 25 MG PO TABS
25.0000 mg | ORAL_TABLET | Freq: Once | ORAL | Status: AC
  Administered 2022-12-19: 25 mg via ORAL
  Filled 2022-12-19: qty 1

## 2022-12-19 NOTE — ED Provider Notes (Signed)
[place] [facility] AT [facility] Provider Note   CSN: [id number] Arrival date & time: 12/19/22  4098     History  No chief complaint on file.   [name] is a 52 y.o. male.  52 year old male present emergency department with chief complaint of vertigo.  Symptoms onset acutely last night at 5:00.  Is been constant at that time.  Room spinning sensation.  Feels off balance.  No aphasia, facial droop, unilateral weakness.  Does have history of prior stroke with some residual paresthesia to the left side of his face.  No chest pain, no shortness of breath.        Home Medications Prior to Admission medications   Medication Sig Start Date End Date Taking? Authorizing Provider  acetaminophen (TYLENOL) 325 MG tablet Take 650 mg by mouth every 6 (six) hours as needed for moderate pain (for pain or headaches).    [provider]  albuterol (VENTOLIN HFA) 108 (90 Base) MCG/ACT inhaler Inhale 1 puff into the lungs every 6 (six) hours as needed for wheezing or shortness of breath. 05/24/21   [provider]  amitriptyline (ELAVIL) 50 MG tablet Take 50 mg by mouth at bedtime.    [provider]  amLODipine (NORVASC) 10 MG tablet TAKE 1 TABLET(10 MG) BY MOUTH DAILY 07/15/21   Laurey Morale, MD  aspirin EC 81 MG EC tablet Take 1 tablet (81 mg total) by mouth daily. 12/12/17   Duke, Roe Rutherford, PA  atorvastatin (LIPITOR) 80 MG tablet Take 80 mg by mouth at bedtime.    [provider]  carvedilol (COREG) 25 MG tablet Take 25 mg by mouth 2 (two) times daily with a meal.    [provider]  cloNIDine (CATAPRES) 0.1 MG tablet Take 1 tablet (0.1 mg total) by mouth 2 (two) times daily. 10/08/22   Clegg, Amy D, NP  Evolocumab (REPATHA SURECLICK) 140 MG/ML SOAJ Inject 140 mg into the skin every 14 (fourteen) days. 10/28/22   Hilty, Lisette Abu, MD  hydrALAZINE (APRESOLINE) 100 MG tablet Take 1 tablet (100 mg total) by mouth 3 (three)  times daily. 11/06/21   Laurey Morale, MD  HYDROcodone-acetaminophen (NORCO) 5-325 MG tablet Take 1-2 tablets by mouth every 6 (six) hours as needed. 03/02/22   Geoffery Lyons, MD  lidocaine-prilocaine (EMLA) cream Apply 1 application. topically as needed (prior to port being accessed). Use on dialysis days (Tues, Thurs and Sat)    [provider]  sildenafil (VIAGRA) 50 MG tablet Take 1 tablet (50 mg total) by mouth daily as needed for erectile dysfunction. 03/30/22   Laurey Morale, MD  Tiotropium Bromide-Olodaterol (STIOLTO RESPIMAT) 2.5-2.5 MCG/ACT AERS Inhale 2 puffs into the lungs daily. 02/28/21   Jacklynn Ganong, FNP      Allergies    Imdur [isosorbide nitrate] and Tramadol    Review of Systems   Review of Systems  Physical Exam Updated Vital Signs BP (!) 179/85   Pulse 74   Temp 98 F (36.7 C) (Oral)   Resp 16   SpO2 99%  Physical Exam Vitals and nursing note reviewed.  Constitutional:      General: He is not in acute distress.    Appearance: He is not toxic-appearing.  HENT:     Head: Normocephalic.     Nose: Nose normal.     Mouth/Throat:     Mouth: Mucous membranes are moist.  Eyes:     Conjunctiva/sclera: Conjunctivae normal.  Cardiovascular:  Rate and Rhythm: Normal rate.  Pulmonary:     Effort: Pulmonary effort is normal.  Abdominal:     General: Abdomen is flat. There is no distension.     Tenderness: There is no abdominal tenderness.  Skin:    General: Skin is warm.     Capillary Refill: Capillary refill takes less than 2 seconds.  Neurological:     Mental Status: He is alert.     Comments: Mild right beating nystagmus when looking towards left.  Minor paresthesia left side of face, at baseline per patient.  No facial droop.  Equal strength bilaterally.  Coordinated movements in all extremities.  Psychiatric:        Mood and Affect: Mood normal.        Behavior: Behavior normal.     ED Results / Procedures / Treatments    Labs (all labs ordered are listed, but only abnormal results are displayed) Labs Reviewed  CBC - Abnormal; Notable for the following components:      Result Value   WBC 3.9 (*)    RBC 3.14 (*)    Hemoglobin 10.3 (*)    HCT 30.5 (*)    Platelets 74 (*)    All other components within normal limits  BASIC METABOLIC PANEL - Abnormal; Notable for the following components:   BUN 65 (*)    Creatinine, Ser 9.02 (*)    GFR, Estimated 7 (*)    All other components within normal limits  CBG MONITORING, ED    EKG EKG Interpretation Date/Time:  Saturday December 19 2022 10:12:37 EDT Ventricular Rate:  74 PR Interval:  172 QRS Duration:  106 QT Interval:  399 QTC Calculation: 443 R Axis:   -29  Text Interpretation: Sinus rhythm Left atrial enlargement LVH with secondary repolarization abnormality Confirmed by Estanislado Pandy [phone]) on 12/19/2022 1:27:39 PM  Radiology CT ANGIO HEAD NECK W WO CM  Result Date: 12/19/2022 CLINICAL DATA:  Vertigo, central. EXAM: CT ANGIOGRAPHY HEAD AND NECK WITH AND WITHOUT CONTRAST TECHNIQUE: Multidetector CT imaging of the head and neck was performed using the standard protocol during bolus administration of intravenous contrast. Multiplanar CT image reconstructions and MIPs were obtained to evaluate the vascular anatomy. Carotid stenosis measurements (when applicable) are obtained utilizing NASCET criteria, using the distal internal carotid diameter as the denominator. RADIATION DOSE REDUCTION: This exam was performed according to the departmental dose-optimization program which includes automated exposure control, adjustment of the mA and/or kV according to patient size and/or use of iterative reconstruction technique. CONTRAST:  75mL OMNIPAQUE IOHEXOL 350 MG/ML SOLN COMPARISON:  CT head without contrast 04/27/2022. MR head without contrast 03/03/2016. CT angio head 02/28/2016. FINDINGS: CT HEAD FINDINGS Brain: The remote left MCA territory infarct is again noted.  A remote infarct of the right middle frontal gyrus is also stable. Periventricular white matter changes are present. No acute infarct, hemorrhage, or mass lesion is present. The ventricles are of normal size. No significant extraaxial fluid collection is present. The brainstem is slightly displaced to the right secondary to enlarged and tortuous left vertebral artery. Brainstem and cerebellum are otherwise within. Vascular: Atherosclerotic calcifications are present the left vertebral artery and proximal basilar artery. Calcifications are also present within the cavernous internal carotid arteries bilaterally. No hyperdense vessel is present. Skull: Calvarium is intact. No focal lytic or blastic lesions are present. No significant extracranial soft tissue lesion is present. Sinuses/Orbits: A remote right medial orbital blowout fracture is stable. Paranasal sinuses and mastoid air cells  are clear. The globes and orbits are within normal limits. Review of the MIP images confirms the above findings CTA NECK FINDINGS Aortic arch: Aortic stent graft is in place. Great vessel origins are proximal to the graft. Right carotid system: Minimal atherosclerotic changes are present at the right carotid bifurcation without significant stenosis. Moderate tortuosity is present in the cervical right ICA without significant stenosis. Left carotid system: The left common carotid artery is within normal limits. Mild atherosclerotic changes are present at the bifurcation without significant stenosis. Moderate tortuosity is present cervical left ICA without significant stenosis. Vertebral arteries: The right vertebral artery is dominant vessel. Both vertebral arteries originate from the subclavian arteries without significant stenosis. The left vertebral artery is occluded proximally positive sore on enhances within and around the left foramina transversarium. A hypoplastic distal V3 segment is reconstituted at the level of C1-2.  Skeleton: Mild degenerative changes are present in the cervical spine. No focal osseous lesions are present. Other neck: Soft tissues the neck are otherwise unremarkable. Salivary glands are within normal limits. Thyroid is normal. No significant adenopathy is present. No focal mucosal or submucosal lesions are present. Prominent venous engorgement is present within the left side of the neck. Upper chest: Centrilobular emphysematous changes are present. Mild dependent atelectasis is present bilaterally. Review of the MIP images confirms the above findings CTA HEAD FINDINGS Anterior circulation: Minimal atherosclerotic calcifications are present within the left cavernous internal carotid artery without significant stenosis. The internal carotid arteries are within normal limits from the skull base to the ICA termini otherwise. Focal fusiform dilation of the distal right M1 segment is noted. The left A1 segment is dominant. Both A2 segments fill from the left. The left M1 segment is normal. Is the ACA and MCA branch vessels are normal bilaterally. Posterior circulation: The left vertebral artery is smaller at the dural margin than on the prior study. Fusiform dilation in the more distal V4 segment extending and proximal basilar artery has increased since the prior study. Basilar artery measures 8.5 mm maximally. The PICA origins are visualized and normal bilaterally. The right vertebral artery is within normal limits. The superior cerebellar arteries are patent. Both posterior cerebral arteries originate basilar tip. A high-grade stenosis is present in the distal right P2 segment. Irregularity of the PCA branch vessels is present bilaterally. Venous sinuses: The dural sinuses are patent. The straight sinus and deep cerebral veins are intact. Cortical veins are within normal limits. No significant vascular malformation is evident. Anatomic variants: None Review of the MIP images confirms the above findings IMPRESSION:  1. Stable remote left MCA territory infarct. 2. Stable remote infarct of the right middle frontal gyrus. 3. Stable atrophy and white matter disease. No acute intracranial abnormality. 4. 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 5. Fusiform dilation of the more V4 segment extending and proximal basilar artery has increased since the prior study. 6. High-grade stenosis of the distal right P2 segment is stable. 7. Irregularity of the distal PCA branch vessels bilaterally. 8. Focal fusiform dilation of the distal right M1 segment has progressed, measuring up to 6 mm. 9. Aortic stent graft in place. 10. Prominent venous engorgement within the left side of the neck. 11.  Emphysema (ICD10-J43.9). Electronically Signed   By: Marin Roberts M.D.   On: 12/19/2022 12:53   VAS US DUPLEX DIALYSIS ACCESS (AVF, AVG)  Result Date: 12/18/2022 DIALYSIS ACCESS Patient Name:  JAMMAL KHAM  Date of  Exam:   12/18/2022 Medical Rec #: [id number]      Accession #:    [id number] Date of Birth: Jun 11, 1970       Patient Gender: M Patient Age:   58 years Exam Location:  [facility] Procedure:      VAS US DUPLEX DIALYSIS ACCESS (AVF, AVG) Referring Phys: Emilie Rutter --------------------------------------------------------------------------------  Reason for Exam: Edema left upper extremity. Access Site: Left Upper Extremity. Access Type: Basilic vein transposition 2nd stage 10/09/2020. History: 07/18/2021 Angioplasty of left brachiocephalic vein stenosis.           Angioplasty of left subclavian vein with a 10 x 60 Mustang balloon.          Angioplasty of the left basilic vein transposition with an 8 x 10          Mustang balloon and a 7 x 2 Mustang balloon Performing Technologist: Elita Quick RVT  Examination Guidelines: A complete evaluation includes B-mode imaging, spectral Doppler, color Doppler, and power Doppler as needed of all accessible portions of each  vessel. Unilateral testing is considered an integral part of a complete examination. Limited examinations for reoccurring indications may be performed as noted.  Findings: +--------------------+----------+-----------------+--------+ AVF                 PSV (cm/s)Flow Vol (mL/min)Comments +--------------------+----------+-----------------+--------+ Native artery inflow   208          2399                +--------------------+----------+-----------------+--------+ AVF Anastomosis        205                              +--------------------+----------+-----------------+--------+  +-------------+----------+-------------+-----------+--------+ OUTFLOW VEIN PSV (cm/s)Diameter (cm)Depth (cm) Describe +-------------+----------+-------------+-----------+--------+ Axillary vein    92        1.35        0.25             +-------------+----------+-------------+-----------+--------+ Prox UA          97        1.63        0.17             +-------------+----------+-------------+-----------+--------+ Mid UA       320 / 192  0.58 / 1.87 0.34 / 0.22         +-------------+----------+-------------+-----------+--------+ Dist UA         586        0.63        0.22             +-------------+----------+-------------+-----------+--------+   Summary: Patent aneurysmal Basilic vein transposition.  *See table(s) above for measurements and observations.  Diagnosing physician: Carolynn Sayers Electronically signed by Carolynn Sayers on 12/18/2022 at 5:10:57 PM.   --------------------------------------------------------------------------------   Final     Procedures Procedures    Medications Ordered in ED Medications  iohexol (OMNIPAQUE) 350 MG/ML injection 75 mL (75 mLs Intravenous Contrast Given 12/19/22 1210)  meclizine (ANTIVERT) tablet 25 mg (25 mg Oral Given 12/19/22 1300)    ED Course/ Medical Decision Making/ A&P Clinical Course as of 12/19/22 1512  Sat Dec 19, 2022  1340  Reevaluated; no improvement with meclizine.  Labs with pancytopenia, but does not necessarily appear new.  Elevated creatinine.   [TY]  1421 Spoke with Dr. Amada Jupiter with neurology, reviewed images and favors stroke.  Given patient's risk factors and imaging would admit for  further stroke workup and nonemergent MRI.  Neurology will follow. [TY]    Clinical Course User Index [TY] Coral Spikes, DO                                 Medical Decision Making 52 year old male present emergency department for vertigo.  Complicated past medical history to include history of prior strokes, ESRD, history of aortic dissection status post repair, hypertension, hyperlipidemia presenting emergency department with vertigo.  No improvement with meclizine.  Has been constant, concern for central cause.  CTA with significant vascular abnormalities to posterior circulation.  Case discussed with Dr. Luisa Hart.  See ED course.  Other labs largely reassuring.  Will admit for stroke workup  Amount and/or Complexity of Data Reviewed External Data Reviewed: radiology.    Details: CT head to 04/27/22"MPRESSION: 1. No evidence for acute intracranial abnormality. 2. 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 3. Remote RIGHT medial orbital fracture. 4. Chronic sinusitis.  Labs: ordered. Decision-making details documented in ED Course. Radiology: ordered.  Risk Prescription drug management. Decision regarding hospitalization.          Final Clinical Impression(s) / ED Diagnoses Final diagnoses:  Vertigo    Rx / DC Orders ED Discharge Orders     None         Coral Spikes, DO 12/19/22 1512

## 2022-12-19 NOTE — ED Notes (Signed)
Greggory Stallion at [facility] will send transport when truck is available for Bed Ready at [facility] 3W RM# 11.-ABB(NS)

## 2022-12-19 NOTE — Consult Note (Addendum)
Neurology Consultation  Reason for Consult: Dizziness-posterior circulation TIA versus stroke given severely diseased posterior circulation on CT angiogram head and neck Referring Physician: Dr. Raphael Gibney  CC: Dizziness  History is obtained from: Patient, chart  HPI: [name] is a 52 y.o. male prior history of strokes with residual left-sided sensory deficits, tobacco abuse, hypertension, marijuana abuse, coronary artery disease status post CABG, ESRD on hemodialysis, descending thoracic aortic aneurysm, hyperlipidemia presented to the ER at [facility] with complaints of sudden onset dizziness around 5 PM yesterday.  Last known well was just before 5 PM yesterday.  He was walking to his car when he suddenly started noticing lightheadedness and dizziness which she describes as room spinning sensation.  He went to bed not making much of it but the symptoms did not completely abate which made him come to the ED. He was extremely hypertensive and has persistent dizziness.  CT angio head and neck shows extremity disease posterior circulation for which neurology was consulted and on-call neurologist recommended admission for stroke/TIA risk factor workup.  LKW: 5p 12/18/22 IV thrombolysis given?: no, outside the window EVT: No-no ELVO Premorbid modified Rankin scale (mRS): 2   ROS: Full ROS was performed and is negative except as noted in the HPI.   Past Medical History:  Diagnosis Date   Adenomatous colon polyp 08/2015   Anxiety    Aortic disease (HCC)    Aortic dissection (HCC)    a. admx 04/2014 >> L renal infarct; a/c renal failure >> b.  s/p Bioprosthetic Bentall and total arch replacement and staged endovascular repair of descending aortic aneurysm ([facility] - Dr. Kizzie Bane)   CAD (coronary artery disease)    a. LHC 4/16:  oD1 60%   Cardiomyopathy (HCC)    a. non-ischemic - probably related to untreated HTN and ETOH abuse - Echo 3/13 with EF 35-40% >> b. Echo 4/16: Severe LVH, EF 55-60%,  moderate AI, moderate MR, mild LAE, trivial effusion, known type B dissection with communication between true and false lumens with suprasternal images suggesting dissection plane may propagate to at least left subclavian takeoff, root above aortic valve okay     Chronic abdominal pain    Chronic combined systolic and diastolic congestive heart failure (HCC)    a. 05/2011: Adm with pulm edema/HTN urgency, EF 35-40% with diffuse hypokinesis and moderate to severe mitral regurgitation. Cardiomyopathy likely due to uncontrolled HTN and ETOH abuse - cath deferred due to renal insufficiency (felt due to uncontrolled HTN). bElzie Rings MV 06/2011: EF 37% and no ischemia or infarction. c. EF 45-50% by echo 01/2012.   Chronic sinusitis    CKD (chronic kidney disease)    dialysis m,t,th,fri   DDD (degenerative disc disease), lumbar    Depression    Descending thoracic aortic aneurysm (HCC)    Dissecting aneurysm of thoracic aorta (HCC)    ESRD (end stage renal disease) (HCC)    dialysis 3 x week, 8 months ago started   ETOH abuse    a. Reported to have quit 05/2011.   Frequent headaches    GERD (gastroesophageal reflux disease)    Headache(784.0)    "q other day" (08/08/2013)   Heart murmur    Hemorrhoid thrombosis    History of echocardiogram    Echo 1/17:  Severe LVH, EF 55-60%, no RWMA, Gr 2 DD, AVR ok, mild to mod MR, mild LAE, mild reduced RVSF, mod RAE   History of medication noncompliance    HYPERLIPIDEMIA  Hypertension    a. Hx of HTN urgency secondary to noncompliance. b. urinary metanephrine and catecholeamine levels normal 2013.  c. Renal art [name] 1/16:  No evidence of renal artery stenosis noted bilaterally.   INGUINAL HERNIA    Pneumonia ~ 2013   Renal insufficiency    Serrated adenoma of colon 08/2015   Stroke [facility])    Tobacco abuse    Valvular heart disease    a. Echo 05/2011: moderate to severe eccentric MR and mild to moderate AI with prolapsing left coronary cusp. b. Echo 01/2012:  mild-mod AI, mild dilitation of aortic root, mild MR.;  c. Echo 1/16: Severe LVH consistent with hypertrophic cardio myopathy, EF 50%, no RWMA, mod AI, mild MR, mild RAE, dilated Ao root (40 mm);     Family History  Problem Relation Age of Onset   Hypertension Mother    Hypertension Other    Colon cancer Paternal Uncle    Stroke Maternal Aunt    Heart attack Brother    Hypertension Brother    Diabetes Maternal Aunt    Lung cancer Maternal Uncle    Stroke Maternal Uncle    Other Sister        "breathing machine at night"   Headache Sister    Thyroid disease Sister    Throat cancer Neg Hx    Pancreatic cancer Neg Hx    Esophageal cancer Neg Hx    Kidney disease Neg Hx    Liver disease Neg Hx     Social History:   reports that he has been smoking cigarettes. He has a 5.8 pack-year smoking history. He has been exposed to tobacco smoke. He has never used smokeless tobacco. He reports that he does not currently use alcohol after a past usage of about 1.0 standard drink of alcohol per week. He reports current drug use. Drug: Marijuana.  Medications  Current Facility-Administered Medications:    arformoterol (BROVANA) nebulizer solution 15 mcg, 15 mcg, Nebulization, BID **AND** [START ON 12/20/2022] umeclidinium bromide (INCRUSE ELLIPTA) 62.5 MCG/ACT 1 puff, 1 puff, Inhalation, Daily, Hall, Carole N, DO   aspirin EC tablet 81 mg, 81 mg, Oral, Daily, Hall, Carole N, DO   atorvastatin (LIPITOR) tablet 80 mg, 80 mg, Oral, QHS, Hall, Carole N, DO   heparin injection 5,000 Units, 5,000 Units, Subcutaneous, Q8H, Hall, Carole N, DO   Exam: Current vital signs: BP (!) 171/87 (BP Location: Right Arm)   Pulse 75   Temp 99.1 F (37.3 C) (Oral)   Resp 17   SpO2 98%  Vital signs in last 24 hours: Temp:  [98 F (36.7 C)-99.1 F (37.3 C)] 99.1 F (37.3 C) (10/05 1937) Pulse Rate:  [71-77] 75 (10/05 1937) Resp:  [14-18] 17 (10/05 1937) BP: (155-179)/(83-89) 171/87 (10/05 1937) SpO2:  [98  %-100 %] 98 % (10/05 1937) General: Patient is awake alert oriented x 3 no distress HEENT: Normocephalic atraumatic Lungs: Clear Cardiovascular: Regular rhythm Abdomen nondistended nontender Neurological exam He is awake alert vented x 3 His speech is mildly dysarthric but the family says that that is his baseline He has no evidence of aphasia Cranial nerves II to XII: Pupils equal round react light, extraocular movements intact, visual fields appear full, face is grossly symmetric but there is a very subtle left lower weakness at rest-again the family thinks that that is his baseline.  Facial sensation is somewhat diminished on the left.  Tongue and palate midline. Motor examination shows no drift in any of the  4 extremities Sensation intact to light touch but subjectively feels left side has sensory deficit which has been residual since prior stroke 7 or 8 years ago. Coordination exam reveals no dysmetria. Gait testing was deferred NIHSS 1a Level of Conscious.: 0 1b LOC Questions: 0 1c LOC Commands: 0 2 Best Gaze: 0 3 Visual: 0 4 Facial Palsy: 1 5a Motor Arm - left: 0 5b Motor Arm - Right: 0 6a Motor Leg - Left: 0 6b Motor Leg - Right: 0 7 Limb Ataxia: 0 8 Sensory: 1 9 Best Language: 0 10 Dysarthria: 1 11 Extinct. and Inatten.: 0 TOTAL: 3   Labs I have reviewed labs in epic and the results pertinent to this consultation are:  CBC    Component Value Date/Time   WBC 3.9 (L) 12/19/2022 1115   RBC 3.14 (L) 12/19/2022 1115   HGB 10.3 (L) 12/19/2022 1115   HGB 12.2 (L) 06/22/2017 1220   HCT 30.5 (L) 12/19/2022 1115   HCT 38.1 06/22/2017 1220   PLT 74 (L) 12/19/2022 1115   PLT 156 06/22/2017 1220   MCV 97.1 12/19/2022 1115   MCV 93 06/22/2017 1220   MCH 32.8 12/19/2022 1115   MCHC 33.8 12/19/2022 1115   RDW 12.8 12/19/2022 1115   RDW 14.9 06/22/2017 1220   LYMPHSABS 1.3 04/27/2022 0819   LYMPHSABS 1.4 06/22/2017 1220   MONOABS 0.4 04/27/2022 0819   EOSABS 0.2  04/27/2022 0819   EOSABS 0.1 06/22/2017 1220   BASOSABS 0.0 04/27/2022 0819   BASOSABS 0.0 06/22/2017 1220    CMP     Component Value Date/Time   NA 141 12/19/2022 1115   NA 140 11/02/2019 0927   K 3.8 12/19/2022 1115   CL 104 12/19/2022 1115   CO2 27 12/19/2022 1115   GLUCOSE 78 12/19/2022 1115   BUN 65 (H) 12/19/2022 1115   BUN 27 (H) 11/02/2019 0927   CREATININE 9.02 (H) 12/19/2022 1115   CREATININE 1.52 (H) 06/08/2014 1132   CALCIUM 9.6 12/19/2022 1115   PROT 7.6 04/27/2022 0819   PROT 7.5 11/02/2019 0927   ALBUMIN 4.4 04/27/2022 0819   ALBUMIN 4.3 11/02/2019 0927   AST 21 04/27/2022 0819   ALT 16 04/27/2022 0819   ALKPHOS 54 04/27/2022 0819   BILITOT 0.4 04/27/2022 0819   BILITOT 0.3 11/02/2019 0927   GFRNONAA 7 (L) 12/19/2022 1115   GFRAA 24 (L) 11/26/2019 0532    Lipid Panel     Component Value Date/Time   CHOL 117 03/30/2022 0839   CHOL 143 10/09/2019 1029   TRIG 45 03/30/2022 0839   HDL 55 03/30/2022 0839   HDL 58 10/09/2019 1029   CHOLHDL 2.1 03/30/2022 0839   VLDL 9 03/30/2022 0839   LDLCALC 53 03/30/2022 0839   LDLCALC 66 10/09/2019 1029    Lab Results  Component Value Date   HGBA1C 5.4 07/23/2020      Imaging I have reviewed the images obtained: CT head and CT angiography head and neck shows stable left MCA territory infarct.  Stable remote infarct in the right middle frontal gyrus.  Stable atrophy and white matter disease with no acute intracranial abnormality.  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  Fusiform dilatation of the more V4 segment extending and the proximal basilar artery increasing to prior study.  High-grade stenosis of the distal right P2 segment is stable.  Irregularity of the distal PCA branch vessels bilaterally.  There is focal  fusiform dilatation of the distal right M1 segment which has progressed measuring up to 6 mm now.  Aortic stent graft is in place.   Prominent venous engorgement within the left side of the neck.  Assessment:  52 year old with multiple cerebrovascular risk factors including hypertension, tobacco abuse, ESRD, hyperlipidemia, prior strokes, CAD status post CABG amongst others presenting for evaluation of sudden onset of dizziness-that he describes as lightheadedness as well as room spinning. Has been hypertensive on arrival and has had difficult to control hypertension per report by him and family. His CT angiography reveals a severely diseased posterior circulation.  Symptoms could be related to posterior circulation TIA versus stroke.  Other etiologies are hypertensive emergency causing dizziness symptoms.  Impression: Stroke/TIA in the posterior circulation versus hypertensive urgency/emergency.  Recommendations: Admit to hospitalist Frequent neurochecks Telemetry Aspirin plus Plavix for 3 months followed by aspirin only High intensity statin MRI brain without contrast 2D echo A1c Lipid panel PT OT speech therapy BP: difficult to control BP at baseline but now with concern for this being a possible stroke/TIA allow permissive hypertension and treat only if systolic blood pressures greater than 220 on a as needed basis.  Gradually lower blood pressures, may be starting tomorrow once imaging is obtained for goal blood pressure normotension on discharge.  Stroke team will follow Plan was relayed to Dr. Margo Aye. -- Milon Dikes, MD Neurologist [facility] Pager: [phone]

## 2022-12-19 NOTE — Progress Notes (Signed)
Notified Patient placement of patient arrival to floor from [facility].

## 2022-12-19 NOTE — ED Triage Notes (Addendum)
Grips equal , pt states he has baseline decreased sensation to leftarm/left flank from prior stroke. Alert and oriented.no neuro deficits

## 2022-12-19 NOTE — ED Triage Notes (Signed)
Pt is dialysis patient, pt is still making urine and only has to be dialyzed once a week, last hd was Tuesday. Yesterday became profoundly dizzy and unbalanced around 5 pm ,has not gotten better, feels like he is going to fall when he walks.

## 2022-12-19 NOTE — H&P (Addendum)
History and Physical  [name] JXB:[id number] DOB: [birth year] DOA: 12/19/2022  Referring physician: Accepted by Dr. Jacqulyn Bath [facility], hospitalist service. PCP: Loura Back, NP  Outpatient Specialists: Cardiology, neurology, nephrology. Patient coming from: Home through [facility]  Chief Complaint: Sudden onset dizziness  HPI: [name] is a 52 y.o. male with medical history significant for coronary artery disease status post CABG, 2 MIs, 3 CVAs, ESRD on hemodialysis once a week, on Tuesdays, descending thoracic aortic aneurysm, history of aortic valve replacement with bioprosthetic valve on statin and aspirin daily, hyperlipidemia, hypertension, tobacco use disorder, severe left jugular venous distention for the past 8 months, who initially presented to [facility] ED with complaints of sudden onset dizziness around 5 PM on 12/18/2022.  He went to bed that evening and woke up around 2 AM this morning with recurrence of the dizziness to the point where he had to hold onto things in order to walk.  Associated with sensation of room spinning and feeling off balance.  This prompted him to come to the ED around 9 AM today for further evaluation.  In the ED, hypertensive with persistent dizziness.  CT angio revealed significant vascular abnormalities to the posterior circulation.  EDP discussed the case with neurology/stroke team on-call who recommended admission for stroke workup.  Admitted by [facility], hospitalist service.  Accepted by Dr. Jacqulyn Bath.  The patient was seen and examined at his bedside.  Continues to have dizziness while laying in bed.  Ongoing stroke workup with brain MRI ordered and pending.  Seen by neurology/stroke team, appreciate assistance.  ED Course: Temperature 99.1.  BP 171/87, pulse 75, respiratory 17, saturation 98% on room air.  Lab studies notable for BUN 65, creatinine 9.02, GFR 7.  WBC 3.9, hemoglobin 10.3, platelet count 74.  Review of Systems: Review of systems as noted  in the HPI. All other systems reviewed and are negative.   Past Medical History:  Diagnosis Date   Adenomatous colon polyp 08/2015   Anxiety    Aortic disease (HCC)    Aortic dissection (HCC)    a. admx 04/2014 >> L renal infarct; a/c renal failure >> b.  s/p Bioprosthetic Bentall and total arch replacement and staged endovascular repair of descending aortic aneurysm ([facility] - Dr. Kizzie Bane)   CAD (coronary artery disease)    a. LHC 4/16:  oD1 60%   Cardiomyopathy (HCC)    a. non-ischemic - probably related to untreated HTN and ETOH abuse - Echo 3/13 with EF 35-40% >> b. Echo 4/16: Severe LVH, EF 55-60%, moderate AI, moderate MR, mild LAE, trivial effusion, known type B dissection with communication between true and false lumens with suprasternal images suggesting dissection plane may propagate to at least left subclavian takeoff, root above aortic valve okay     Chronic abdominal pain    Chronic combined systolic and diastolic congestive heart failure (HCC)    a. 05/2011: Adm with pulm edema/HTN urgency, EF 35-40% with diffuse hypokinesis and moderate to severe mitral regurgitation. Cardiomyopathy likely due to uncontrolled HTN and ETOH abuse - cath deferred due to renal insufficiency (felt due to uncontrolled HTN). bElzie Rings MV 06/2011: EF 37% and no ischemia or infarction. c. EF 45-50% by echo 01/2012.   Chronic sinusitis    CKD (chronic kidney disease)    dialysis m,t,th,fri   DDD (degenerative disc disease), lumbar    Depression    Descending thoracic aortic aneurysm [facility])    Dissecting aneurysm of thoracic aorta (HCC)    ESRD (  The dural sinuses are patent. The straight sinus and deep cerebral veins are intact. Cortical veins are within normal limits. No significant vascular malformation is evident. Anatomic variants: None Review of the MIP images confirms the above findings IMPRESSION: 1. Stable remote left MCA territory infarct. 2. Stable remote infarct of the right middle frontal gyrus. 3. Stable atrophy and white matter disease. No acute intracranial abnormality. 4. 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 5. Fusiform dilation of the more V4 segment extending and proximal basilar artery has increased since the prior study. 6. High-grade stenosis of the distal right P2 segment is stable. 7. Irregularity of the distal PCA branch vessels bilaterally. 8. Focal fusiform dilation of the distal right M1 segment has progressed, measuring up to 6 mm. 9. Aortic stent graft in place. 10. Prominent venous engorgement within the left side of the neck. 11.  Emphysema (ICD10-J43.9). Electronically Signed   By: Marin Roberts M.D.   On: 12/19/2022 12:53   VAS US DUPLEX DIALYSIS ACCESS (AVF, AVG)  Result Date: 12/18/2022 DIALYSIS ACCESS Patient Name:  [name]  Date of Exam:   12/18/2022 Medical Rec #: [id number]      Accession #:    [id number] Date of Birth: [birth year]       Patient Gender: M Patient Age:   34 years Exam Location:  [facility] Procedure:      VAS US DUPLEX DIALYSIS ACCESS (AVF, AVG) Referring Phys: Emilie Rutter --------------------------------------------------------------------------------  Reason for Exam: Edema left upper extremity. Access Site: Left Upper Extremity. Access Type: Basilic  vein transposition 2nd stage 10/09/2020. History: 07/18/2021 Angioplasty of left brachiocephalic vein stenosis.           Angioplasty of left subclavian vein with a 10 x 60 Mustang balloon.          Angioplasty of the left basilic vein transposition with an 8 x 10          Mustang balloon and a 7 x 2 Mustang balloon Performing Technologist: Elita Quick RVT  Examination Guidelines: A complete evaluation includes B-mode imaging, spectral Doppler, color Doppler, and power Doppler as needed of all accessible portions of each vessel. Unilateral testing is considered an integral part of a complete examination. Limited examinations for reoccurring indications may be performed as noted.  Findings: +--------------------+----------+-----------------+--------+ AVF                 PSV (cm/s)Flow Vol (mL/min)Comments +--------------------+----------+-----------------+--------+ Native artery inflow   208          2399                +--------------------+----------+-----------------+--------+ AVF Anastomosis        205                              +--------------------+----------+-----------------+--------+  +-------------+----------+-------------+-----------+--------+ OUTFLOW VEIN PSV (cm/s)Diameter (cm)Depth (cm) Describe +-------------+----------+-------------+-----------+--------+ Axillary vein    92        1.35        0.25             +-------------+----------+-------------+-----------+--------+ Prox UA          97        1.63        0.17             +-------------+----------+-------------+-----------+--------+ Mid UA       320 / 192  0.58 /  The dural sinuses are patent. The straight sinus and deep cerebral veins are intact. Cortical veins are within normal limits. No significant vascular malformation is evident. Anatomic variants: None Review of the MIP images confirms the above findings IMPRESSION: 1. Stable remote left MCA territory infarct. 2. Stable remote infarct of the right middle frontal gyrus. 3. Stable atrophy and white matter disease. No acute intracranial abnormality. 4. 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 5. Fusiform dilation of the more V4 segment extending and proximal basilar artery has increased since the prior study. 6. High-grade stenosis of the distal right P2 segment is stable. 7. Irregularity of the distal PCA branch vessels bilaterally. 8. Focal fusiform dilation of the distal right M1 segment has progressed, measuring up to 6 mm. 9. Aortic stent graft in place. 10. Prominent venous engorgement within the left side of the neck. 11.  Emphysema (ICD10-J43.9). Electronically Signed   By: Marin Roberts M.D.   On: 12/19/2022 12:53   VAS US DUPLEX DIALYSIS ACCESS (AVF, AVG)  Result Date: 12/18/2022 DIALYSIS ACCESS Patient Name:  [name]  Date of Exam:   12/18/2022 Medical Rec #: [id number]      Accession #:    [id number] Date of Birth: [birth year]       Patient Gender: M Patient Age:   34 years Exam Location:  [facility] Procedure:      VAS US DUPLEX DIALYSIS ACCESS (AVF, AVG) Referring Phys: Emilie Rutter --------------------------------------------------------------------------------  Reason for Exam: Edema left upper extremity. Access Site: Left Upper Extremity. Access Type: Basilic  vein transposition 2nd stage 10/09/2020. History: 07/18/2021 Angioplasty of left brachiocephalic vein stenosis.           Angioplasty of left subclavian vein with a 10 x 60 Mustang balloon.          Angioplasty of the left basilic vein transposition with an 8 x 10          Mustang balloon and a 7 x 2 Mustang balloon Performing Technologist: Elita Quick RVT  Examination Guidelines: A complete evaluation includes B-mode imaging, spectral Doppler, color Doppler, and power Doppler as needed of all accessible portions of each vessel. Unilateral testing is considered an integral part of a complete examination. Limited examinations for reoccurring indications may be performed as noted.  Findings: +--------------------+----------+-----------------+--------+ AVF                 PSV (cm/s)Flow Vol (mL/min)Comments +--------------------+----------+-----------------+--------+ Native artery inflow   208          2399                +--------------------+----------+-----------------+--------+ AVF Anastomosis        205                              +--------------------+----------+-----------------+--------+  +-------------+----------+-------------+-----------+--------+ OUTFLOW VEIN PSV (cm/s)Diameter (cm)Depth (cm) Describe +-------------+----------+-------------+-----------+--------+ Axillary vein    92        1.35        0.25             +-------------+----------+-------------+-----------+--------+ Prox UA          97        1.63        0.17             +-------------+----------+-------------+-----------+--------+ Mid UA       320 / 192  0.58 /  History and Physical  [name] JXB:[id number] DOB: [birth year] DOA: 12/19/2022  Referring physician: Accepted by Dr. Jacqulyn Bath [facility], hospitalist service. PCP: Loura Back, NP  Outpatient Specialists: Cardiology, neurology, nephrology. Patient coming from: Home through [facility]  Chief Complaint: Sudden onset dizziness  HPI: [name] is a 52 y.o. male with medical history significant for coronary artery disease status post CABG, 2 MIs, 3 CVAs, ESRD on hemodialysis once a week, on Tuesdays, descending thoracic aortic aneurysm, history of aortic valve replacement with bioprosthetic valve on statin and aspirin daily, hyperlipidemia, hypertension, tobacco use disorder, severe left jugular venous distention for the past 8 months, who initially presented to [facility] ED with complaints of sudden onset dizziness around 5 PM on 12/18/2022.  He went to bed that evening and woke up around 2 AM this morning with recurrence of the dizziness to the point where he had to hold onto things in order to walk.  Associated with sensation of room spinning and feeling off balance.  This prompted him to come to the ED around 9 AM today for further evaluation.  In the ED, hypertensive with persistent dizziness.  CT angio revealed significant vascular abnormalities to the posterior circulation.  EDP discussed the case with neurology/stroke team on-call who recommended admission for stroke workup.  Admitted by [facility], hospitalist service.  Accepted by Dr. Jacqulyn Bath.  The patient was seen and examined at his bedside.  Continues to have dizziness while laying in bed.  Ongoing stroke workup with brain MRI ordered and pending.  Seen by neurology/stroke team, appreciate assistance.  ED Course: Temperature 99.1.  BP 171/87, pulse 75, respiratory 17, saturation 98% on room air.  Lab studies notable for BUN 65, creatinine 9.02, GFR 7.  WBC 3.9, hemoglobin 10.3, platelet count 74.  Review of Systems: Review of systems as noted  in the HPI. All other systems reviewed and are negative.   Past Medical History:  Diagnosis Date   Adenomatous colon polyp 08/2015   Anxiety    Aortic disease (HCC)    Aortic dissection (HCC)    a. admx 04/2014 >> L renal infarct; a/c renal failure >> b.  s/p Bioprosthetic Bentall and total arch replacement and staged endovascular repair of descending aortic aneurysm ([facility] - Dr. Kizzie Bane)   CAD (coronary artery disease)    a. LHC 4/16:  oD1 60%   Cardiomyopathy (HCC)    a. non-ischemic - probably related to untreated HTN and ETOH abuse - Echo 3/13 with EF 35-40% >> b. Echo 4/16: Severe LVH, EF 55-60%, moderate AI, moderate MR, mild LAE, trivial effusion, known type B dissection with communication between true and false lumens with suprasternal images suggesting dissection plane may propagate to at least left subclavian takeoff, root above aortic valve okay     Chronic abdominal pain    Chronic combined systolic and diastolic congestive heart failure (HCC)    a. 05/2011: Adm with pulm edema/HTN urgency, EF 35-40% with diffuse hypokinesis and moderate to severe mitral regurgitation. Cardiomyopathy likely due to uncontrolled HTN and ETOH abuse - cath deferred due to renal insufficiency (felt due to uncontrolled HTN). bElzie Rings MV 06/2011: EF 37% and no ischemia or infarction. c. EF 45-50% by echo 01/2012.   Chronic sinusitis    CKD (chronic kidney disease)    dialysis m,t,th,fri   DDD (degenerative disc disease), lumbar    Depression    Descending thoracic aortic aneurysm [facility])    Dissecting aneurysm of thoracic aorta (HCC)    ESRD (  The dural sinuses are patent. The straight sinus and deep cerebral veins are intact. Cortical veins are within normal limits. No significant vascular malformation is evident. Anatomic variants: None Review of the MIP images confirms the above findings IMPRESSION: 1. Stable remote left MCA territory infarct. 2. Stable remote infarct of the right middle frontal gyrus. 3. Stable atrophy and white matter disease. No acute intracranial abnormality. 4. 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 5. Fusiform dilation of the more V4 segment extending and proximal basilar artery has increased since the prior study. 6. High-grade stenosis of the distal right P2 segment is stable. 7. Irregularity of the distal PCA branch vessels bilaterally. 8. Focal fusiform dilation of the distal right M1 segment has progressed, measuring up to 6 mm. 9. Aortic stent graft in place. 10. Prominent venous engorgement within the left side of the neck. 11.  Emphysema (ICD10-J43.9). Electronically Signed   By: Marin Roberts M.D.   On: 12/19/2022 12:53   VAS US DUPLEX DIALYSIS ACCESS (AVF, AVG)  Result Date: 12/18/2022 DIALYSIS ACCESS Patient Name:  [name]  Date of Exam:   12/18/2022 Medical Rec #: [id number]      Accession #:    [id number] Date of Birth: [birth year]       Patient Gender: M Patient Age:   34 years Exam Location:  [facility] Procedure:      VAS US DUPLEX DIALYSIS ACCESS (AVF, AVG) Referring Phys: Emilie Rutter --------------------------------------------------------------------------------  Reason for Exam: Edema left upper extremity. Access Site: Left Upper Extremity. Access Type: Basilic  vein transposition 2nd stage 10/09/2020. History: 07/18/2021 Angioplasty of left brachiocephalic vein stenosis.           Angioplasty of left subclavian vein with a 10 x 60 Mustang balloon.          Angioplasty of the left basilic vein transposition with an 8 x 10          Mustang balloon and a 7 x 2 Mustang balloon Performing Technologist: Elita Quick RVT  Examination Guidelines: A complete evaluation includes B-mode imaging, spectral Doppler, color Doppler, and power Doppler as needed of all accessible portions of each vessel. Unilateral testing is considered an integral part of a complete examination. Limited examinations for reoccurring indications may be performed as noted.  Findings: +--------------------+----------+-----------------+--------+ AVF                 PSV (cm/s)Flow Vol (mL/min)Comments +--------------------+----------+-----------------+--------+ Native artery inflow   208          2399                +--------------------+----------+-----------------+--------+ AVF Anastomosis        205                              +--------------------+----------+-----------------+--------+  +-------------+----------+-------------+-----------+--------+ OUTFLOW VEIN PSV (cm/s)Diameter (cm)Depth (cm) Describe +-------------+----------+-------------+-----------+--------+ Axillary vein    92        1.35        0.25             +-------------+----------+-------------+-----------+--------+ Prox UA          97        1.63        0.17             +-------------+----------+-------------+-----------+--------+ Mid UA       320 / 192  0.58 /  History and Physical  [name] JXB:[id number] DOB: [birth year] DOA: 12/19/2022  Referring physician: Accepted by Dr. Jacqulyn Bath [facility], hospitalist service. PCP: Loura Back, NP  Outpatient Specialists: Cardiology, neurology, nephrology. Patient coming from: Home through [facility]  Chief Complaint: Sudden onset dizziness  HPI: [name] is a 52 y.o. male with medical history significant for coronary artery disease status post CABG, 2 MIs, 3 CVAs, ESRD on hemodialysis once a week, on Tuesdays, descending thoracic aortic aneurysm, history of aortic valve replacement with bioprosthetic valve on statin and aspirin daily, hyperlipidemia, hypertension, tobacco use disorder, severe left jugular venous distention for the past 8 months, who initially presented to [facility] ED with complaints of sudden onset dizziness around 5 PM on 12/18/2022.  He went to bed that evening and woke up around 2 AM this morning with recurrence of the dizziness to the point where he had to hold onto things in order to walk.  Associated with sensation of room spinning and feeling off balance.  This prompted him to come to the ED around 9 AM today for further evaluation.  In the ED, hypertensive with persistent dizziness.  CT angio revealed significant vascular abnormalities to the posterior circulation.  EDP discussed the case with neurology/stroke team on-call who recommended admission for stroke workup.  Admitted by [facility], hospitalist service.  Accepted by Dr. Jacqulyn Bath.  The patient was seen and examined at his bedside.  Continues to have dizziness while laying in bed.  Ongoing stroke workup with brain MRI ordered and pending.  Seen by neurology/stroke team, appreciate assistance.  ED Course: Temperature 99.1.  BP 171/87, pulse 75, respiratory 17, saturation 98% on room air.  Lab studies notable for BUN 65, creatinine 9.02, GFR 7.  WBC 3.9, hemoglobin 10.3, platelet count 74.  Review of Systems: Review of systems as noted  in the HPI. All other systems reviewed and are negative.   Past Medical History:  Diagnosis Date   Adenomatous colon polyp 08/2015   Anxiety    Aortic disease (HCC)    Aortic dissection (HCC)    a. admx 04/2014 >> L renal infarct; a/c renal failure >> b.  s/p Bioprosthetic Bentall and total arch replacement and staged endovascular repair of descending aortic aneurysm ([facility] - Dr. Kizzie Bane)   CAD (coronary artery disease)    a. LHC 4/16:  oD1 60%   Cardiomyopathy (HCC)    a. non-ischemic - probably related to untreated HTN and ETOH abuse - Echo 3/13 with EF 35-40% >> b. Echo 4/16: Severe LVH, EF 55-60%, moderate AI, moderate MR, mild LAE, trivial effusion, known type B dissection with communication between true and false lumens with suprasternal images suggesting dissection plane may propagate to at least left subclavian takeoff, root above aortic valve okay     Chronic abdominal pain    Chronic combined systolic and diastolic congestive heart failure (HCC)    a. 05/2011: Adm with pulm edema/HTN urgency, EF 35-40% with diffuse hypokinesis and moderate to severe mitral regurgitation. Cardiomyopathy likely due to uncontrolled HTN and ETOH abuse - cath deferred due to renal insufficiency (felt due to uncontrolled HTN). bElzie Rings MV 06/2011: EF 37% and no ischemia or infarction. c. EF 45-50% by echo 01/2012.   Chronic sinusitis    CKD (chronic kidney disease)    dialysis m,t,th,fri   DDD (degenerative disc disease), lumbar    Depression    Descending thoracic aortic aneurysm [facility])    Dissecting aneurysm of thoracic aorta (HCC)    ESRD (  The dural sinuses are patent. The straight sinus and deep cerebral veins are intact. Cortical veins are within normal limits. No significant vascular malformation is evident. Anatomic variants: None Review of the MIP images confirms the above findings IMPRESSION: 1. Stable remote left MCA territory infarct. 2. Stable remote infarct of the right middle frontal gyrus. 3. Stable atrophy and white matter disease. No acute intracranial abnormality. 4. 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 5. Fusiform dilation of the more V4 segment extending and proximal basilar artery has increased since the prior study. 6. High-grade stenosis of the distal right P2 segment is stable. 7. Irregularity of the distal PCA branch vessels bilaterally. 8. Focal fusiform dilation of the distal right M1 segment has progressed, measuring up to 6 mm. 9. Aortic stent graft in place. 10. Prominent venous engorgement within the left side of the neck. 11.  Emphysema (ICD10-J43.9). Electronically Signed   By: Marin Roberts M.D.   On: 12/19/2022 12:53   VAS US DUPLEX DIALYSIS ACCESS (AVF, AVG)  Result Date: 12/18/2022 DIALYSIS ACCESS Patient Name:  [name]  Date of Exam:   12/18/2022 Medical Rec #: [id number]      Accession #:    [id number] Date of Birth: [birth year]       Patient Gender: M Patient Age:   34 years Exam Location:  [facility] Procedure:      VAS US DUPLEX DIALYSIS ACCESS (AVF, AVG) Referring Phys: Emilie Rutter --------------------------------------------------------------------------------  Reason for Exam: Edema left upper extremity. Access Site: Left Upper Extremity. Access Type: Basilic  vein transposition 2nd stage 10/09/2020. History: 07/18/2021 Angioplasty of left brachiocephalic vein stenosis.           Angioplasty of left subclavian vein with a 10 x 60 Mustang balloon.          Angioplasty of the left basilic vein transposition with an 8 x 10          Mustang balloon and a 7 x 2 Mustang balloon Performing Technologist: Elita Quick RVT  Examination Guidelines: A complete evaluation includes B-mode imaging, spectral Doppler, color Doppler, and power Doppler as needed of all accessible portions of each vessel. Unilateral testing is considered an integral part of a complete examination. Limited examinations for reoccurring indications may be performed as noted.  Findings: +--------------------+----------+-----------------+--------+ AVF                 PSV (cm/s)Flow Vol (mL/min)Comments +--------------------+----------+-----------------+--------+ Native artery inflow   208          2399                +--------------------+----------+-----------------+--------+ AVF Anastomosis        205                              +--------------------+----------+-----------------+--------+  +-------------+----------+-------------+-----------+--------+ OUTFLOW VEIN PSV (cm/s)Diameter (cm)Depth (cm) Describe +-------------+----------+-------------+-----------+--------+ Axillary vein    92        1.35        0.25             +-------------+----------+-------------+-----------+--------+ Prox UA          97        1.63        0.17             +-------------+----------+-------------+-----------+--------+ Mid UA       320 / 192  0.58 /

## 2022-12-19 NOTE — H&P (View-Only) (Signed)
History and Physical  [name] JXB:[id number] DOB: [birth year] DOA: 12/19/2022  Referring physician: Accepted by Dr. Jacqulyn Ewing [facility], hospitalist service. PCP: Andre Back, NP  Outpatient Specialists: Cardiology, neurology, nephrology. Patient coming from: Home through [facility]  Chief Complaint: Sudden onset dizziness  HPI: [name] is a 52 y.o. male with medical history significant for coronary artery disease status post CABG, 2 MIs, 3 CVAs, ESRD on hemodialysis once a week, on Tuesdays, descending thoracic aortic aneurysm, history of aortic valve replacement with bioprosthetic valve on statin and aspirin daily, hyperlipidemia, hypertension, tobacco use disorder, severe left jugular venous distention for the past 8 months, who initially presented to [facility] ED with complaints of sudden onset dizziness around 5 PM on 12/18/2022.  He went to bed that evening and woke up around 2 AM this morning with recurrence of the dizziness to the point where he had to hold onto things in order to walk.  Associated with sensation of room spinning and feeling off balance.  This prompted him to come to the ED around 9 AM today for further evaluation.  In the ED, hypertensive with persistent dizziness.  CT angio revealed significant vascular abnormalities to the posterior circulation.  EDP discussed the case with neurology/stroke team on-call who recommended admission for stroke workup.  Admitted by [facility], hospitalist service.  Accepted by Dr. Jacqulyn Ewing.  The patient was seen and examined at his bedside.  Continues to have dizziness while laying in bed.  Ongoing stroke workup with brain MRI ordered and pending.  Seen by neurology/stroke team, appreciate assistance.  ED Course: Temperature 99.1.  BP 171/87, pulse 75, respiratory 17, saturation 98% on room air.  Lab studies notable for BUN 65, creatinine 9.02, GFR 7.  WBC 3.9, hemoglobin 10.3, platelet count 74.  Review of Systems: Review of systems as noted  in the HPI. All other systems reviewed and are negative.   Past Medical History:  Diagnosis Date   Adenomatous colon polyp 08/2015   Anxiety    Aortic disease (HCC)    Aortic dissection (HCC)    a. admx 04/2014 >> L renal infarct; a/c renal failure >> b.  s/p Bioprosthetic [place] and total arch replacement and staged endovascular repair of descending aortic aneurysm ([facility] - Dr. Kizzie Bane)   CAD (coronary artery disease)    a. LHC 4/16:  oD1 60%   Cardiomyopathy (HCC)    a. non-ischemic - probably related to untreated HTN and ETOH abuse - Echo 3/13 with EF 35-40% >> b. Echo 4/16: Severe LVH, EF 55-60%, moderate AI, moderate MR, mild LAE, trivial effusion, known type B dissection with communication between true and false lumens with suprasternal images suggesting dissection plane may propagate to at least left subclavian takeoff, root above aortic valve okay     Chronic abdominal pain    Chronic combined systolic and diastolic congestive heart failure (HCC)    a. 05/2011: Adm with pulm edema/HTN urgency, EF 35-40% with diffuse hypokinesis and moderate to severe mitral regurgitation. Cardiomyopathy likely due to uncontrolled HTN and ETOH abuse - cath deferred due to renal insufficiency (felt due to uncontrolled HTN). bElzie Rings MV 06/2011: EF 37% and no ischemia or infarction. c. EF 45-50% by echo 01/2012.   Chronic sinusitis    CKD (chronic kidney disease)    dialysis m,t,th,fri   DDD (degenerative disc disease), lumbar    Depression    Descending thoracic aortic aneurysm [facility])    Dissecting aneurysm of thoracic aorta (HCC)    ESRD (  end stage renal disease) (HCC)    dialysis 3 x week, 8 months ago started   ETOH abuse    a. Reported to have quit 05/2011.   Frequent headaches    GERD (gastroesophageal reflux disease)    Headache(784.0)    "q other day" (08/08/2013)   Heart murmur    Hemorrhoid thrombosis    History of echocardiogram    Echo 1/17:  Severe LVH, EF 55-60%, no RWMA, Gr 2 DD, AVR  ok, mild to mod MR, mild LAE, mild reduced RVSF, mod RAE   History of medication noncompliance    HYPERLIPIDEMIA    Hypertension    a. Hx of HTN urgency secondary to noncompliance. b. urinary metanephrine and catecholeamine levels normal 2013.  c. Renal art [name] 1/16:  No evidence of renal artery stenosis noted bilaterally.   INGUINAL HERNIA    Pneumonia ~ 2013   Renal insufficiency    Serrated adenoma of colon 08/2015   Stroke [facility])    Tobacco abuse    Valvular heart disease    a. Echo 05/2011: moderate to severe eccentric MR and mild to moderate AI with prolapsing left coronary cusp. b. Echo 01/2012: mild-mod AI, mild dilitation of aortic root, mild MR.;  c. Echo 1/16: Severe LVH consistent with hypertrophic cardio myopathy, EF 50%, no RWMA, mod AI, mild MR, mild RAE, dilated Ao root (40 mm);    Past Surgical History:  Procedure Laterality Date   A/V FISTULAGRAM Left 03/07/2021   Procedure: A/V FISTULAGRAM;  Surgeon: Chuck Hint, MD;  Location: [facility] INVASIVE CV LAB;  Service: Cardiovascular;  Laterality: Left;   A/V FISTULAGRAM Left 07/18/2021   Procedure: A/V Fistulagram;  Surgeon: Chuck Hint, MD;  Location: [facility] INVASIVE CV LAB;  Service: Cardiovascular;  Laterality: Left;   ANKLE SURGERY Bilateral    Fractures bilaterally   AORTIC VALVE SURGERY  09/2014   AV FISTULA PLACEMENT Left 09/09/2020   Procedure: LEFT ARM ARTERIOVENOUS (AV) FISTULA CREATION;  Surgeon: Leonie Douglas, MD;  Location: [facility] OR;  Service: Vascular;  Laterality: Left;   BASCILIC VEIN TRANSPOSITION Left 10/09/2020   Procedure: LEFT SECOND STAGE BASILIC VEIN TRANSPOSITION;  Surgeon: Leonie Douglas, MD;  Location: [facility] OR;  Service: Vascular;  Laterality: Left;  PERIPHERAL NERVE BLOCK   CORONARY ANGIOGRAPHY N/A 12/06/2017   Procedure: CORONARY ANGIOGRAPHY;  Surgeon: Lyn Records, MD;  Location: [facility] INVASIVE CV LAB;  Service: Cardiovascular;  Laterality: N/A;   CORONARY ANGIOGRAPHY N/A 09/26/2018    Procedure: CORONARY ANGIOGRAPHY;  Surgeon: Yvonne Kendall, MD;  Location: [facility] INVASIVE CV LAB;  Service: Cardiovascular;  Laterality: N/A;   CORONARY STENT INTERVENTION N/A 12/10/2017   Procedure: CORONARY STENT INTERVENTION;  Surgeon: Lennette Bihari, MD;  Location: [facility] INVASIVE CV LAB;  Service: Cardiovascular;  Laterality: N/A;   FOOT FRACTURE SURGERY Bilateral 2004-2010   "got pins in both of them"   HEMORRHOID SURGERY N/A 06/15/2015   Procedure: HEMORRHOIDECTOMY;  Surgeon: Almond Lint, MD;  Location: [facility] OR;  Service: General;  Laterality: N/A;   INGUINAL HERNIA REPAIR Right ~ 1996   INSERTION OF DIALYSIS CATHETER N/A 09/09/2020   Procedure: INSERTION OF TUNNELED DIALYSIS PALINDROME 19cm CATHETER;  Surgeon: Leonie Douglas, MD;  Location: [facility] OR;  Service: Vascular;  Laterality: N/A;   LEFT HEART CATH AND CORONARY ANGIOGRAPHY N/A 02/05/2021   Procedure: LEFT HEART CATH AND CORONARY ANGIOGRAPHY;  Surgeon: Laurey Morale, MD;  Location: [facility] INVASIVE CV LAB;  Service: Cardiovascular;  Laterality: N/A;  LEFT HEART CATHETERIZATION WITH CORONARY ANGIOGRAM N/A 06/21/2014   Procedure: LEFT HEART CATHETERIZATION WITH CORONARY ANGIOGRAM;  Surgeon: Laurey Morale, MD;  Location: [facility] CATH LAB;  Service: Cardiovascular;  Laterality: N/A;   LOOP RECORDER INSERTION N/A 06/19/2016   Procedure: Loop Recorder Insertion;  Surgeon: [facility] Salvia, MD;  Location: [facility] INVASIVE CV LAB;  Service: Cardiovascular;  Laterality: N/A;   PERIPHERAL VASCULAR BALLOON ANGIOPLASTY  03/07/2021   Procedure: PERIPHERAL VASCULAR BALLOON ANGIOPLASTY;  Surgeon: Chuck Hint, MD;  Location: [facility] INVASIVE CV LAB;  Service: Cardiovascular;;   PERIPHERAL VASCULAR BALLOON ANGIOPLASTY  07/18/2021   Procedure: PERIPHERAL VASCULAR BALLOON ANGIOPLASTY;  Surgeon: Chuck Hint, MD;  Location: [facility] INVASIVE CV LAB;  Service: Cardiovascular;;   TEE WITHOUT CARDIOVERSION N/A 03/12/2016   Procedure: TRANSESOPHAGEAL ECHOCARDIOGRAM (TEE);   Surgeon: Thurmon Fair, MD;  Location: [facility] ENDOSCOPY;  Service: Cardiovascular;  Laterality: N/A;   TEE WITHOUT CARDIOVERSION N/A 10/16/2020   Procedure: TRANSESOPHAGEAL ECHOCARDIOGRAM (TEE);  Surgeon: Laurey Morale, MD;  Location: [facility] ENDOSCOPY;  Service: Cardiovascular;  Laterality: N/A;   UPPER EXTREMITY VENOGRAPHY Right 07/18/2021   Procedure: UPPER EXTREMITY VENOGRAPHY;  Surgeon: Chuck Hint, MD;  Location: [facility] INVASIVE CV LAB;  Service: Cardiovascular;  Laterality: Right;    Social History:  reports that he has been smoking cigarettes. He has a 5.8 pack-year smoking history. He has been exposed to tobacco smoke. He has never used smokeless tobacco. He reports that he does not currently use alcohol after a past usage of about 1.0 standard drink of alcohol per week. He reports current drug use. Drug: Marijuana.   Allergies  Allergen Reactions   Imdur [Isosorbide Nitrate] Other (See Comments)    Headaches -- patient instructed to continue taking    Tramadol Other (See Comments)    Headaches     Family History  Problem Relation Age of Onset   Hypertension Mother    Hypertension Other    Colon cancer Paternal Uncle    Stroke Maternal Aunt    Heart attack Brother    Hypertension Brother    Diabetes Maternal Aunt    Lung cancer Maternal Uncle    Stroke Maternal Uncle    Other Sister        "breathing machine at night"   Headache Sister    Thyroid disease Sister    Throat cancer Neg Hx    Pancreatic cancer Neg Hx    Esophageal cancer Neg Hx    Kidney disease Neg Hx    Liver disease Neg Hx       Prior to Admission medications   Medication Sig Start Date End Date Taking? Authorizing Provider  acetaminophen (TYLENOL) 325 MG tablet Take 650 mg by mouth every 6 (six) hours as needed for moderate pain (for pain or headaches).    [provider]  albuterol (VENTOLIN HFA) 108 (90 Base) MCG/ACT inhaler Inhale 1 puff into the lungs every 6 (six) hours as needed for  wheezing or shortness of breath. 05/24/21   [provider]  amitriptyline (ELAVIL) 50 MG tablet Take 50 mg by mouth at bedtime.    [provider]  amLODipine (NORVASC) 10 MG tablet TAKE 1 TABLET(10 MG) BY MOUTH DAILY 07/15/21   Laurey Morale, MD  aspirin EC 81 MG EC tablet Take 1 tablet (81 mg total) by mouth daily. 12/12/17   [facility], Roe Rutherford, PA  atorvastatin (LIPITOR) 80 MG tablet Take 80 mg by mouth at bedtime.    [provider]  carvedilol (COREG) 25 MG tablet Take 25 mg by mouth 2 (two) times daily with a meal.    [provider]  cloNIDine (CATAPRES) 0.1 MG tablet Take 1 tablet (0.1 mg total) by mouth 2 (two) times daily. 10/08/22   Clegg, Amy D, NP  Evolocumab (REPATHA SURECLICK) 140 MG/ML SOAJ Inject 140 mg into the skin every 14 (fourteen) days. 10/28/22   Hilty, Lisette Abu, MD  hydrALAZINE (APRESOLINE) 100 MG tablet Take 1 tablet (100 mg total) by mouth 3 (three) times daily. 11/06/21   Laurey Morale, MD  HYDROcodone-acetaminophen (NORCO) 5-325 MG tablet Take 1-2 tablets by mouth every 6 (six) hours as needed. 03/02/22   Geoffery Lyons, MD  lidocaine-prilocaine (EMLA) cream Apply 1 application. topically as needed (prior to port being accessed). Use on dialysis days (Tues, Thurs and Sat)    [provider]  sildenafil (VIAGRA) 50 MG tablet Take 1 tablet (50 mg total) by mouth daily as needed for erectile dysfunction. 03/30/22   Laurey Morale, MD  Tiotropium Bromide-Olodaterol (STIOLTO RESPIMAT) 2.5-2.5 MCG/ACT AERS Inhale 2 puffs into the lungs daily. 02/28/21   Jacklynn Ganong, FNP    Physical Exam: BP (!) 171/87 (BP Location: Right Arm)   Pulse 75   Temp 99.1 F (37.3 C) (Oral)   Resp 17   SpO2 98%   General: 52 y.o. year-old male well developed well nourished in no acute distress.  Alert and oriented x3. Cardiovascular: Regular rate and rhythm with no rubs or gallops.  Severe left jugular venous distention noted.  Left upper  extremity AV fistula.  No lower extremity edema bilaterally. Respiratory: Clear to auscultation with no wheezes or rales. Good inspiratory effort. Abdomen: Soft nontender nondistended with normal bowel sounds x4 quadrants. Muskuloskeletal: No cyanosis, clubbing or edema noted bilaterally Neuro: CN II-XII intact, strength, sensation, reflexes Skin: No ulcerative lesions noted or rashes Psychiatry: Judgement and insight appear normal. Mood is appropriate for condition and setting          Labs on Admission:  Basic Metabolic Panel: Recent Labs  Lab 12/19/22 1115  NA 141  K 3.8  CL 104  CO2 27  GLUCOSE 78  BUN 65*  CREATININE 9.02*  CALCIUM 9.6   Liver Function Tests: No results for input(s): "AST", "ALT", "ALKPHOS", "BILITOT", "PROT", "ALBUMIN" in the last 168 hours. No results for input(s): "LIPASE", "AMYLASE" in the last 168 hours. No results for input(s): "AMMONIA" in the last 168 hours. CBC: Recent Labs  Lab 12/19/22 1115  WBC 3.9*  HGB 10.3*  HCT 30.5*  MCV 97.1  PLT 74*   Cardiac Enzymes: No results for input(s): "CKTOTAL", "CKMB", "CKMBINDEX", "TROPONINI" in the last 168 hours.  BNP (last 3 results) Recent Labs    03/01/22 1930 04/27/22 0819  BNP 346.6* 198.1*    ProBNP (last 3 results) No results for input(s): "PROBNP" in the last 8760 hours.  CBG: Recent Labs  Lab 12/19/22 1004  GLUCAP 86    Radiological Exams on Admission: CT ANGIO HEAD NECK W WO CM  Result Date: 12/19/2022 CLINICAL DATA:  Vertigo, central. EXAM: CT ANGIOGRAPHY HEAD AND NECK WITH AND WITHOUT CONTRAST TECHNIQUE: Multidetector CT imaging of the head and neck was performed using the standard protocol during bolus administration of intravenous contrast. Multiplanar CT image reconstructions and MIPs were obtained to evaluate the vascular anatomy. Carotid stenosis measurements (when applicable) are obtained utilizing NASCET criteria, using the distal internal carotid diameter as the  denominator. RADIATION  DOSE REDUCTION: This exam was performed according to the departmental dose-optimization program which includes automated exposure control, adjustment of the mA and/or kV according to patient size and/or use of iterative reconstruction technique. CONTRAST:  75mL OMNIPAQUE IOHEXOL 350 MG/ML SOLN COMPARISON:  CT head without contrast 04/27/2022. MR head without contrast 03/03/2016. CT angio head 02/28/2016. FINDINGS: CT HEAD FINDINGS Brain: The remote left MCA territory infarct is again noted. A remote infarct of the right middle frontal gyrus is also stable. Periventricular white matter changes are present. No acute infarct, hemorrhage, or mass lesion is present. The ventricles are of normal size. No significant extraaxial fluid collection is present. The brainstem is slightly displaced to the right secondary to enlarged and tortuous left vertebral artery. Brainstem and cerebellum are otherwise within. Vascular: Atherosclerotic calcifications are present the left vertebral artery and proximal basilar artery. Calcifications are also present within the cavernous internal carotid arteries bilaterally. No hyperdense vessel is present. Skull: Calvarium is intact. No focal lytic or blastic lesions are present. No significant extracranial soft tissue lesion is present. Sinuses/Orbits: A remote right medial orbital blowout fracture is stable. Paranasal sinuses and mastoid air cells are clear. The globes and orbits are within normal limits. Review of the MIP images confirms the above findings CTA NECK FINDINGS Aortic arch: Aortic stent graft is in place. Great vessel origins are proximal to the graft. Right carotid system: Minimal atherosclerotic changes are present at the right carotid bifurcation without significant stenosis. Moderate tortuosity is present in the cervical right ICA without significant stenosis. Left carotid system: The left common carotid artery is within normal limits. Mild  atherosclerotic changes are present at the bifurcation without significant stenosis. Moderate tortuosity is present cervical left ICA without significant stenosis. Vertebral arteries: The right vertebral artery is dominant vessel. Both vertebral arteries originate from the subclavian arteries without significant stenosis. The left vertebral artery is occluded proximally positive sore on enhances within and around the left foramina transversarium. A hypoplastic distal V3 segment is reconstituted at the level of C1-2. Skeleton: Mild degenerative changes are present in the cervical spine. No focal osseous lesions are present. Other neck: Soft tissues the neck are otherwise unremarkable. Salivary glands are within normal limits. Thyroid is normal. No significant adenopathy is present. No focal mucosal or submucosal lesions are present. Prominent venous engorgement is present within the left side of the neck. Upper chest: Centrilobular emphysematous changes are present. Mild dependent atelectasis is present bilaterally. Review of the MIP images confirms the above findings CTA HEAD FINDINGS Anterior circulation: Minimal atherosclerotic calcifications are present within the left cavernous internal carotid artery without significant stenosis. The internal carotid arteries are within normal limits from the skull base to the ICA termini otherwise. Focal fusiform dilation of the distal right M1 segment is noted. The left A1 segment is dominant. Both A2 segments fill from the left. The left M1 segment is normal. Is the ACA and MCA branch vessels are normal bilaterally. Posterior circulation: The left vertebral artery is smaller at the dural margin than on the prior study. Fusiform dilation in the more distal V4 segment extending and proximal basilar artery has increased since the prior study. Basilar artery measures 8.5 mm maximally. The PICA origins are visualized and normal bilaterally. The right vertebral artery is within  normal limits. The superior cerebellar arteries are patent. Both posterior cerebral arteries originate basilar tip. A high-grade stenosis is present in the distal right P2 segment. Irregularity of the PCA branch vessels is present bilaterally. Venous sinuses:  The dural sinuses are patent. The straight sinus and deep cerebral veins are intact. Cortical veins are within normal limits. No significant vascular malformation is evident. Anatomic variants: None Review of the MIP images confirms the above findings IMPRESSION: 1. Stable remote left MCA territory infarct. 2. Stable remote infarct of the right middle frontal gyrus. 3. Stable atrophy and white matter disease. No acute intracranial abnormality. 4. 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 5. Fusiform dilation of the more V4 segment extending and proximal basilar artery has increased since the prior study. 6. High-grade stenosis of the distal right P2 segment is stable. 7. Irregularity of the distal PCA branch vessels bilaterally. 8. Focal fusiform dilation of the distal right M1 segment has progressed, measuring up to 6 mm. 9. Aortic stent graft in place. 10. Prominent venous engorgement within the left side of the neck. 11.  Emphysema (ICD10-J43.9). Electronically Signed   By: Marin Roberts M.D.   On: 12/19/2022 12:53   VAS US DUPLEX DIALYSIS ACCESS (AVF, AVG)  Result Date: 12/18/2022 DIALYSIS ACCESS Patient Name:  BERTRUM HELMSTETTER  Date of Exam:   12/18/2022 Medical Rec #: [id number]      Accession #:    [id number] Date of Birth: [birth year]       Patient Gender: M Patient Age:   34 years Exam Location:  [facility] Procedure:      VAS US DUPLEX DIALYSIS ACCESS (AVF, AVG) Referring Phys: Emilie Rutter --------------------------------------------------------------------------------  Reason for Exam: Edema left upper extremity. Access Site: Left Upper Extremity. Access Type: Basilic  vein transposition 2nd stage 10/09/2020. History: 07/18/2021 Angioplasty of left brachiocephalic vein stenosis.           Angioplasty of left subclavian vein with a 10 x 60 Mustang balloon.          Angioplasty of the left basilic vein transposition with an 8 x 10          Mustang balloon and a 7 x 2 Mustang balloon Performing Technologist: Elita Quick RVT  Examination Guidelines: A complete evaluation includes B-mode imaging, spectral Doppler, color Doppler, and power Doppler as needed of all accessible portions of each vessel. Unilateral testing is considered an integral part of a complete examination. Limited examinations for reoccurring indications may be performed as noted.  Findings: +--------------------+----------+-----------------+--------+ AVF                 PSV (cm/s)Flow Vol (mL/min)Comments +--------------------+----------+-----------------+--------+ Native artery inflow   208          2399                +--------------------+----------+-----------------+--------+ AVF Anastomosis        205                              +--------------------+----------+-----------------+--------+  +-------------+----------+-------------+-----------+--------+ OUTFLOW VEIN PSV (cm/s)Diameter (cm)Depth (cm) Describe +-------------+----------+-------------+-----------+--------+ Axillary vein    92        1.35        0.25             +-------------+----------+-------------+-----------+--------+ Prox UA          97        1.63        0.17             +-------------+----------+-------------+-----------+--------+ Mid UA       320 / 192  0.58 /  1.87 0.34 / 0.22         +-------------+----------+-------------+-----------+--------+ Dist UA         586        0.63        0.22             +-------------+----------+-------------+-----------+--------+   Summary: Patent aneurysmal Basilic vein transposition.  *See table(s) above for measurements and observations.  Diagnosing physician: Carolynn Sayers Electronically signed by Carolynn Sayers on 12/18/2022 at 5:10:57 PM.   --------------------------------------------------------------------------------   Final     EKG: I independently viewed the EKG done and my findings are as followed: Sinus rhythm rate of 74.  LVH.  QTc 443.  Assessment/Plan Present on Admission: **None**  Principal Problem:   Vertigo  Vertigo, rule out CVA Stroke workup ongoing CT angio head and neck with significant vascular abnormalities Brain MRI ordered and pending Fasting lipid panel, A1c Neurochecks every 4 hours PT/OT/speech therapist evaluation Fall precautions 2D echo with bubble study Resume home aspirin and Lipitor Aspirin and Plavix for 3 months followed by aspirin alone Continue high intensity statin Neurology/stroke team consulted by EDP and following.  Appreciate assistance.  Coronary artery disease status post CABG Resume home regimen No anginal symptoms reported.  Significant vascular abnormalities seen on CT angio head and neck Severely distended left JVD History of dissecting aneurysm of thoracic aorta, aneurysm of ascending aorta Vascular surgery consulted to assist with the management  Hypertension, BP is not at goal, elevated Allow for permissive hypertension if acute CVA is present Otherwise resume home oral antihypertensives Closely monitor vital signs  Hyperlipidemia Resume home regimen  ESRD on hemodialysis once a week, Tuesdays Nephrology consulted to assist with the management Volume and electrolytes managed with hemodialysis Patient still makes urine  History of left internal jugular vein thromboembolism Not on anticoagulation prior to admission.  COPD, not on oxygen supplementation at baseline. No acute issues Resume home bronchodilators  Tobacco use disorder Counseled on the importance of tobacco cessation States he smokes several cigarettes, about 7 per day  Chronic anxiety/depression Resume home  regimen  HFpEF with grade 1 diastolic dysfunction Monitor volume status with HD and with strict I's and O's and daily weight Follow 2D echo  Chronic pancytopenia, at baseline Leukopenia, anemia and thrombocytopenia No evidence of bleeding or acute infective process. Monitor for now   Critical care time: 75 minutes.   DVT prophylaxis: Subcu heparin 3 times daily  Code Status: Full code  Family Communication: None at bedside.  Disposition Plan: Admitted to telemetry medical unit by Dr. Jacqulyn Ewing, [facility], hospitalist service.  Consults called: Vascular surgery, routine consult (Dr. Lenell Antu), nephrology, neurology.  Admission status: Observation status   Status is: Observation    Darlin Drop MD [facility] Pager [phone]  If 7PM-7AM, please contact night-coverage [email or web address] Password [email or web address]  12/19/2022, 7:50 PM

## 2022-12-19 NOTE — Progress Notes (Signed)
This is 52 year old gentleman with history of ESRD on once weekly hemodialysis since he still produces urine (per EDP), last dialysis on Tuesday, hypertension, combined systolic and diastolic heart failure, COPD history of CVA, hyperlipidemia and many other comorbidities presented to ED with a complaint of dizziness which started yesterday evening around 5 PM.  Cutting to EDP, patient does not have any other focal symptoms or any focal deficit on examination.  CT angio head and neck was done which shows several stenosis but no stroke.  Despite of getting Antivert in the ED, patient continues to have vertigo.  EDP discussed the case with on-call neurologist Dr. Amada Jupiter who recommended admission under hospitalist service for stroke workup.  Patient's blood pressure is only slightly elevated.  Patient has not received any antiplatelets in the ED.  I advised EDP to seek recommendations from neurologist about any antiplatelet that patient should be given.  Other labs are at his baseline.  Patient has been accepted under hospitalist service at [facility].  EDP to remain responsible for all patient care until patient arrives to [facility] campus and then [facility] to resume care.  Nursing staff, please call patient placement once patient arrives to the [facility].

## 2022-12-19 NOTE — ED Notes (Signed)
Nurse for Pt asked that she call me back to get report on pt when she is able. I left my name and number for a return call back. I did advise floor that pt has left already with [facility] and they are on the way.

## 2022-12-20 ENCOUNTER — Observation Stay (HOSPITAL_COMMUNITY): Payer: 59

## 2022-12-20 DIAGNOSIS — R42 Dizziness and giddiness: Secondary | ICD-10-CM

## 2022-12-20 LAB — LIPID PANEL
Cholesterol: 153 mg/dL (ref 0–200)
HDL: 47 mg/dL (ref >40)
LDL Cholesterol: 97 mg/dL (ref 0–99)
Total CHOL/HDL Ratio: 3.3 {ratio}
Triglycerides: 47 mg/dL (ref <150)
VLDL: 9 mg/dL (ref 0–40)

## 2022-12-20 LAB — RENAL FUNCTION PANEL
Albumin: 3.6 g/dL (ref 3.5–5.0)
Anion gap: 13 (ref 5–15)
BUN: 61 mg/dL — ABNORMAL HIGH (ref 6–20)
CO2: 22 mmol/L (ref 22–32)
Calcium: 9.4 mg/dL (ref 8.9–10.3)
Chloride: 105 mmol/L (ref 98–111)
Creatinine, Ser: 8.86 mg/dL — ABNORMAL HIGH (ref 0.61–1.24)
GFR, Estimated: 7 mL/min — ABNORMAL LOW (ref >60)
Glucose, Bld: 86 mg/dL (ref 70–99)
Phosphorus: 5.3 mg/dL — ABNORMAL HIGH (ref 2.5–4.6)
Potassium: 4.5 mmol/L (ref 3.5–5.1)
Sodium: 140 mmol/L (ref 135–145)

## 2022-12-20 LAB — HEMOGLOBIN A1C
Hgb A1c MFr Bld: 5.1 % (ref 4.8–5.6)
Mean Plasma Glucose: 99.67 mg/dL

## 2022-12-20 LAB — CBC
HCT: 34.3 % — ABNORMAL LOW (ref 39.0–52.0)
Hemoglobin: 10.9 g/dL — ABNORMAL LOW (ref 13.0–17.0)
MCH: 30.8 pg (ref 26.0–34.0)
MCHC: 31.8 g/dL (ref 30.0–36.0)
MCV: 96.9 fL (ref 80.0–100.0)
Platelets: 80 10*3/uL — ABNORMAL LOW (ref 150–400)
RBC: 3.54 MIL/uL — ABNORMAL LOW (ref 4.22–5.81)
RDW: 12.6 % (ref 11.5–15.5)
WBC: 4.1 10*3/uL (ref 4.0–10.5)
nRBC: 0 % (ref 0.0–0.2)

## 2022-12-20 NOTE — Progress Notes (Addendum)
STROKE TEAM PROGRESS NOTE   BRIEF HPI Andre. [name] is a 52 y.o. male with history of left-sided sensory deficits, tobacco abuse, cannabis abuse, HTN, CAD s/p CABG, ESRD on dialysis, thoracic aortic aneurysm, HLD   presented 10/5 to the [facility] ED for acute onset dizziness. He was admitted 10/5 for stroke workup.   SIGNIFICANT HOSPITAL EVENTS 10/5 - Admitted for stroke workup 10/6 - Consult for vascular surgery, recommending stenting procedure  INTERIM HISTORY/SUBJECTIVE Saw patient bedside late morning, pt was lying in bed in NAD. Pt reports no changes in his dizziness symptoms. Pt was frustrated with the lack of progress or disclosure about what is going on with his illness. He was grateful for the chance to talk through things. Shared with him the results from his CT from yesterday said that there was no sign of an acute infarct and that today's MRI showed the same. While there, consulting vascular surgeon joined for last portion of the exam to share the plan to perform additional stenting procedure tomorrow to relieve the vascular congestion on [name] left side.  OBJECTIVE  CBC    Component Value Date/Time   WBC 4.1 12/20/2022 1002   RBC 3.54 (L) 12/20/2022 1002   HGB 10.9 (L) 12/20/2022 1002   HGB 12.2 (L) 06/22/2017 1220   HCT 34.3 (L) 12/20/2022 1002   HCT 38.1 06/22/2017 1220   PLT 80 (L) 12/20/2022 1002   PLT 156 06/22/2017 1220   MCV 96.9 12/20/2022 1002   MCV 93 06/22/2017 1220   MCH 30.8 12/20/2022 1002   MCHC 31.8 12/20/2022 1002   RDW 12.6 12/20/2022 1002   RDW 14.9 06/22/2017 1220   LYMPHSABS 1.3 04/27/2022 0819   LYMPHSABS 1.4 06/22/2017 1220   MONOABS 0.4 04/27/2022 0819   EOSABS 0.2 04/27/2022 0819   EOSABS 0.1 06/22/2017 1220   BASOSABS 0.0 04/27/2022 0819   BASOSABS 0.0 06/22/2017 1220    BMET    Component Value Date/Time   NA 140 12/20/2022 1002   NA 140 11/02/2019 0927   K 4.5 12/20/2022 1002   CL 105 12/20/2022 1002   CO2 22  12/20/2022 1002   GLUCOSE 86 12/20/2022 1002   BUN 61 (H) 12/20/2022 1002   BUN 27 (H) 11/02/2019 0927   CREATININE 8.86 (H) 12/20/2022 1002   CREATININE 1.52 (H) 06/08/2014 1132   CALCIUM 9.4 12/20/2022 1002   GFRNONAA 7 (L) 12/20/2022 1002    IMAGING past 24 hours Andre BRAIN WO CONTRAST  Result Date: 12/20/2022 CLINICAL DATA:  Syncope EXAM: MRI HEAD WITHOUT CONTRAST TECHNIQUE: Multiplanar, multiecho pulse sequences of the brain and surrounding structures were obtained without intravenous contrast. COMPARISON:  None Available. FINDINGS: Brain: No acute infarct, mass effect or extra-axial collection. There is multifocal hyperintense T2-weighted signal within the white matter. Parenchymal volume and CSF spaces are normal. Old left parietal infarct. The midline structures are normal. Vascular: Vertebrobasilar dolichoectasia, unchanged. Flow voids are preserved. Skull and upper cervical spine: Normal calvarium and skull base. Visualized upper cervical spine and soft tissues are normal. Sinuses/Orbits:Trace left mastoid fluid. Mild bilateral maxillary sinus mucosal thickening. Normal orbits. IMPRESSION: 1. No acute intracranial abnormality. 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 3. Chronic small vessel ischemia. Electronically Signed   By: Deatra Robinson M.D.   On: 12/20/2022 02:02   CT ANGIO HEAD NECK W WO CM  Result Date: 12/19/2022 CLINICAL DATA:  Vertigo, central. EXAM: CT ANGIOGRAPHY HEAD AND NECK WITH AND WITHOUT CONTRAST TECHNIQUE: Multidetector CT imaging of the head  and neck was performed using the standard protocol during bolus administration of intravenous contrast. Multiplanar CT image reconstructions and MIPs were obtained to evaluate the vascular anatomy. Carotid stenosis measurements (when applicable) are obtained utilizing NASCET criteria, using the distal internal carotid diameter as the denominator. RADIATION DOSE REDUCTION: This exam was performed according to the departmental dose-optimization  program which includes automated exposure control, adjustment of the mA and/or kV according to patient size and/or use of iterative reconstruction technique. CONTRAST:  75mL OMNIPAQUE IOHEXOL 350 MG/ML SOLN COMPARISON:  CT head without contrast 04/27/2022. Andre head without contrast 03/03/2016. CT angio head 02/28/2016. FINDINGS: CT HEAD FINDINGS Brain: The remote left MCA territory infarct is again noted. A remote infarct of the right middle frontal gyrus is also stable. Periventricular white matter changes are present. No acute infarct, hemorrhage, or mass lesion is present. The ventricles are of normal size. No significant extraaxial fluid collection is present. The brainstem is slightly displaced to the right secondary to enlarged and tortuous left vertebral artery. Brainstem and cerebellum are otherwise within. Vascular: Atherosclerotic calcifications are present the left vertebral artery and proximal basilar artery. Calcifications are also present within the cavernous internal carotid arteries bilaterally. No hyperdense vessel is present. Skull: Calvarium is intact. No focal lytic or blastic lesions are present. No significant extracranial soft tissue lesion is present. Sinuses/Orbits: A remote right medial orbital blowout fracture is stable. Paranasal sinuses and mastoid air cells are clear. The globes and orbits are within normal limits. Review of the MIP images confirms the above findings CTA NECK FINDINGS Aortic arch: Aortic stent graft is in place. Great vessel origins are proximal to the graft. Right carotid system: Minimal atherosclerotic changes are present at the right carotid bifurcation without significant stenosis. Moderate tortuosity is present in the cervical right ICA without significant stenosis. Left carotid system: The left common carotid artery is within normal limits. Mild atherosclerotic changes are present at the bifurcation without significant stenosis. Moderate tortuosity is present  cervical left ICA without significant stenosis. Vertebral arteries: The right vertebral artery is dominant vessel. Both vertebral arteries originate from the subclavian arteries without significant stenosis. The left vertebral artery is occluded proximally positive sore on enhances within and around the left foramina transversarium. A hypoplastic distal V3 segment is reconstituted at the level of C1-2. Skeleton: Mild degenerative changes are present in the cervical spine. No focal osseous lesions are present. Other neck: Soft tissues the neck are otherwise unremarkable. Salivary glands are within normal limits. Thyroid is normal. No significant adenopathy is present. No focal mucosal or submucosal lesions are present. Prominent venous engorgement is present within the left side of the neck. Upper chest: Centrilobular emphysematous changes are present. Mild dependent atelectasis is present bilaterally. Review of the MIP images confirms the above findings CTA HEAD FINDINGS Anterior circulation: Minimal atherosclerotic calcifications are present within the left cavernous internal carotid artery without significant stenosis. The internal carotid arteries are within normal limits from the skull base to the ICA termini otherwise. Focal fusiform dilation of the distal right M1 segment is noted. The left A1 segment is dominant. Both A2 segments fill from the left. The left M1 segment is normal. Is the ACA and MCA branch vessels are normal bilaterally. Posterior circulation: The left vertebral artery is smaller at the dural margin than on the prior study. Fusiform dilation in the more distal V4 segment extending and proximal basilar artery has increased since the prior study. Basilar artery measures 8.5 mm maximally. The PICA origins are  visualized and normal bilaterally. The right vertebral artery is within normal limits. The superior cerebellar arteries are patent. Both posterior cerebral arteries originate basilar tip. A  high-grade stenosis is present in the distal right P2 segment. Irregularity of the PCA branch vessels is present bilaterally. Venous sinuses: The dural sinuses are patent. The straight sinus and deep cerebral veins are intact. Cortical veins are within normal limits. No significant vascular malformation is evident. Anatomic variants: None Review of the MIP images confirms the above findings IMPRESSION: 1. Stable remote left MCA territory infarct. 2. Stable remote infarct of the right middle frontal gyrus. 3. Stable atrophy and white matter disease. No acute intracranial abnormality. 4. 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 5. Fusiform dilation of the more V4 segment extending and proximal basilar artery has increased since the prior study. 6. High-grade stenosis of the distal right P2 segment is stable. 7. Irregularity of the distal PCA branch vessels bilaterally. 8. Focal fusiform dilation of the distal right M1 segment has progressed, measuring up to 6 mm. 9. Aortic stent graft in place. 10. Prominent venous engorgement within the left side of the neck. 11.  Emphysema (ICD10-J43.9). Electronically Signed   By: Marin Roberts M.D.   On: 12/19/2022 12:53    Vitals:   12/20/22 0000 12/20/22 0456 12/20/22 0542 12/20/22 0843  BP: (!) 159/74 (!) 165/99  (!) 167/92  Pulse: 64 73  73  Resp: 14 15  19   Temp: 97.9 F (36.6 C) 97.8 F (36.6 C)  (!) 97.5 F (36.4 C)  TempSrc: Axillary Oral  Oral  SpO2: 100% 100%  100%  Weight:   69.1 kg      PHYSICAL EXAM General:  Alert, well-nourished, well-developed patient in no acute distress Psych:  Mood and affect appropriate for situation CV: Regular rate and rhythm on monitor Respiratory:  Regular, unlabored respirations on room air GI: Abdomen soft and nontender  NEURO:  Mental Status: AA&Ox3, patient is able to give clear and coherent history Speech/Language: speech is without dysarthria or  aphasia.  Naming, repetition, fluency, and comprehension intact.  Cranial Nerves:  II: PERRL. Visual fields full.  III, IV, VI: EOMI. Eyelids elevate symmetrically. Left eye swollen. V: Sensation is intact to light touch and symmetrical to face.  VII: Face is symmetrical resting and smiling VIII: hearing intact to voice. IX, X: Palate elevates symmetrically. Phonation is normal.  XB:[id number] shrug 5/5. XII: tongue is midline without fasciculations. Motor: 5/5 strength to all muscle groups tested.  Tone: is normal and bulk is normal Sensation- Intact to light touch bilaterally. Extinction absent to light touch to DSS.   Coordination:  HKS: no ataxia in BLE. No drift.  Gait- deferred  Dix-Hallpike Maneuver produced no nystagmus. Patient saccades and tracking of motion were normal on exam.   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 Stable remote left MCA territory infarct. 2. Stable remote infarct of the right middle frontal gyrus. 3. Stable atrophy and white matter disease. No acute intracranial abnormality. 4. 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 5. Fusiform dilation of the more V4 segment extending and proximal basilar artery has increased since the prior study. 6. High-grade stenosis of the distal right P2 segment is stable. 7. Irregularity of the distal PCA branch vessels bilaterally.  8. Focal fusiform dilation of the distal right M1 segment has progressed, measuring up to 6 mm. 9. Aortic stent graft in place. 10. Prominent venous engorgement within the left side of the neck. 11.  Emphysema (ICD10-J43.9). MRI  12/20/2022 IMPRESSION: 1. No acute intracranial abnormality. 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 3. Chronic small vessel ischemia.  LDL 97 HgbA1c 5.1 Echo  pending. VTE prophylaxis - heparin 5000 subcutaneous Q24 aspirin 81 mg daily prior to admission, now on aspirin 81 mg daily and clopidogrel 75 mg daily for 3 weeks and then aspirin alone. Therapy recommendations:  No follow up needed  Disposition:  home  Hx of Stroke/TIA PT has hx of stroke (L parietal, R middle frontal) No signs of re-bleed.   Hypertension Home meds:  hydralazine, amlodipine, carvedilol, clonidine Unstable Blood Pressure Goal: SBP less than 160   Hyperlipidemia Home meds:  atorvastatin, repatha, atorvastatin resumed in hospital LDL 97, goal < 70 Continue meds at discharge  Tobacco Abuse Patient smokes 1 packs per day for 5.8 years       Ready to quit? Yes Nicotine replacement therapy provided  Substance Abuse Patient uses alcohol UDS positive for  THC        Ready to quit? Yes TOC consult for cessation placed  Dysphagia Patient has no post-stroke dysphagia Diet Orders (From admission, onward)     Start     Ordered   12/21/22 0001  Diet NPO time specified  Diet effective midnight        12/20/22 1151   12/20/22 1151  Diet renal with fluid restriction Fluid restriction: 1200 mL Fluid; Room service appropriate? Yes; Fluid consistency: Thin  Diet effective now       Question Answer Comment  Fluid restriction: 1200 mL Fluid   Room service appropriate? Yes   Fluid consistency: Thin      12/20/22 1151           Other Stroke Risk Factors ETOH use, alcohol level No results found for requested labs within last 1095 days., advised to drink no more than 1 drink(s) a day Coronary artery disease  Other Active Problems   Hospital day # 0    To contact Stroke Continuity provider, please refer to [email or web address]. After hours, contact General Neurology    Thierfelder K.M., Hall Busing., Wendee Copp., Armbruster M., Andi Devon., Christen Butter M.F., Dustin Folks and von Baumgarten L., Vertebral artery hypoplasia: frequency and effect on cerebellar blood flow  characteristics, Stroke 45 (2014), 1610-9604  Milus Height, Newman-Toker DE, Rip Harbour, Vira Browns, Bertholon P, Levy Pupa, Bisdorff Murrell Redden. Vascular vertigo and dizziness: Diagnostic criteria. J Vestib Res. 2022;32(3):205-222. doi: 10.3233/VES-210169. PMID: 54098119; PMCID: [id number].  ATTENDING ATTESTATION:  52 year old with acute onset of dizziness.  His exam is unremarkable today.  MRI negative for stroke.  CTA shows significant with treat vertebrobasilar insufficiency with chronic left vertebral artery occlusion fusiform dilatation of V4 segment high-grade P2 segment stenosis irregularity of the distal PCA branches.  His symptoms of dizziness worse with standing and walking appears to be consistent with this.  When laying he does not have dizziness at the moment.  Given no stroke on MRI recommend DAPT therapy for 3 weeks then aspirin alone.  Discussed adequate fluid intake as well as management of his blood pressure which she admits he is not good at controlling.  Also discussed at length about quitting smoking and tobacco cessation counseling was provided.  Dix-Hallpike was attempted by the resident as well  as therapy with no significant improvement likely indicating is not BPPV.  If echo is unremarkable neurology sign off.  Discussed with Dr. Lowell Guitar.  Dr. Viviann Spare evaluated pt independently, reviewed imaging, chart, labs. Discussed and formulated plan with the Resident/APP. Changes were made to the note where appropriate. Please see APP/resident note above for details.   Total 36 minutes spent on counseling patient and coordinating care, writing notes and reviewing chart.    Emilynn Srinivasan,MD

## 2022-12-20 NOTE — Plan of Care (Signed)

## 2022-12-20 NOTE — Consult Note (Signed)
[facility] [place]  ASSESSMENT / PLAN: 52 y.o. 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  I am concerned that he has "crowding" of the mediastinum from his prior thoracic aortic debranching and stent grafting.  I am suspicious the graft are impinging on the left brachiocephalic vein making outflow into the superior vena cava limited.  We will plan for fistulogram with central venous intervention tomorrow.  Keep n.p.o. after midnight.  Orders for consent written.  CHIEF COMPLAINT: Dizziness  HISTORY OF PRESENT ILLNESS: [name] is a 52 y.o. male admitted to the hospital for dizziness.  He is well-known to our service having undergone complex thoracic aortic reconstruction at outside hospital.  Our team has managed hemodialysis access for him.  I created a left brachiobasilic arteriovenous fistula for him 2 years ago which has required 2 fistulogram's for maintenance.  On most recent fistulogram, the patient had near occlusion of his left brachiocephalic vein.  I reviewed these findings with [name] in detail.  He saw our PA on 12/28/2022, with extensive left jugular distention and left eye congestion, which prompted recommendation for fistulogram.  My evaluation today, the findings from 12/18/2022 virtually the same.  Patient has extensive distention of his left jugular vein and protrusion of the left thigh which is bothersome to him.  I explained that I did not think this could explain his symptoms of dizziness, but we would address it nonetheless.  Past Medical History:  Diagnosis Date   Adenomatous colon polyp 08/2015   Anxiety    Aortic disease (HCC)    Aortic dissection (HCC)    a. admx 04/2014 >> L renal infarct; a/c renal failure >> b.  s/p Bioprosthetic Bentall and total arch replacement and staged endovascular repair of descending aortic aneurysm ([facility] - Dr. Kizzie Bane)   CAD (coronary artery disease)    a. LHC 4/16:  oD1 60%   Cardiomyopathy  (HCC)    a. non-ischemic - probably related to untreated HTN and ETOH abuse - Echo 3/13 with EF 35-40% >> b. Echo 4/16: Severe LVH, EF 55-60%, moderate AI, moderate MR, mild LAE, trivial effusion, known type B dissection with communication between true and false lumens with suprasternal images suggesting dissection plane may propagate to at least left subclavian takeoff, root above aortic valve okay     Chronic abdominal pain    Chronic combined systolic and diastolic congestive heart failure (HCC)    a. 05/2011: Adm with pulm edema/HTN urgency, EF 35-40% with diffuse hypokinesis and moderate to severe mitral regurgitation. Cardiomyopathy likely due to uncontrolled HTN and ETOH abuse - cath deferred due to renal insufficiency (felt due to uncontrolled HTN). bElzie Rings MV 06/2011: EF 37% and no ischemia or infarction. c. EF 45-50% by echo 01/2012.   Chronic sinusitis    CKD (chronic kidney disease)    dialysis m,t,th,fri   DDD (degenerative disc disease), lumbar    Depression    Descending thoracic aortic aneurysm (HCC)    Dissecting aneurysm of thoracic aorta (HCC)    ESRD (end stage renal disease) (HCC)    dialysis 3 x week, 8 months ago started   ETOH abuse    a. Reported to have quit 05/2011.   Frequent headaches    GERD (gastroesophageal reflux disease)    Headache(784.0)    "q other day" (08/08/2013)   Heart murmur    Hemorrhoid thrombosis    History of echocardiogram    Echo 1/17:  Severe LVH, EF 55-60%,  no RWMA, Gr 2 DD, AVR ok, mild to mod MR, mild LAE, mild reduced RVSF, mod RAE   History of medication noncompliance    HYPERLIPIDEMIA    Hypertension    a. Hx of HTN urgency secondary to noncompliance. b. urinary metanephrine and catecholeamine levels normal 2013.  c. Renal art [name] 1/16:  No evidence of renal artery stenosis noted bilaterally.   INGUINAL HERNIA    Pneumonia ~ 2013   Renal insufficiency    Serrated adenoma of colon 08/2015   Stroke [facility])    Tobacco abuse     Valvular heart disease    a. Echo 05/2011: moderate to severe eccentric MR and mild to moderate AI with prolapsing left coronary cusp. b. Echo 01/2012: mild-mod AI, mild dilitation of aortic root, mild MR.;  c. Echo 1/16: Severe LVH consistent with hypertrophic cardio myopathy, EF 50%, no RWMA, mod AI, mild MR, mild RAE, dilated Ao root (40 mm);     Past Surgical History:  Procedure Laterality Date   A/V FISTULAGRAM Left 03/07/2021   Procedure: A/V FISTULAGRAM;  Surgeon: Chuck Hint, MD;  Location: [facility] INVASIVE CV LAB;  Service: Cardiovascular;  Laterality: Left;   A/V FISTULAGRAM Left 07/18/2021   Procedure: A/V Fistulagram;  Surgeon: Chuck Hint, MD;  Location: [facility] INVASIVE CV LAB;  Service: Cardiovascular;  Laterality: Left;   ANKLE SURGERY Bilateral    Fractures bilaterally   AORTIC VALVE SURGERY  09/2014   AV FISTULA PLACEMENT Left 09/09/2020   Procedure: LEFT ARM ARTERIOVENOUS (AV) FISTULA CREATION;  Surgeon: Leonie Douglas, MD;  Location: [facility] OR;  Service: Vascular;  Laterality: Left;   BASCILIC VEIN TRANSPOSITION Left 10/09/2020   Procedure: LEFT SECOND STAGE BASILIC VEIN TRANSPOSITION;  Surgeon: Leonie Douglas, MD;  Location: [facility] OR;  Service: Vascular;  Laterality: Left;  PERIPHERAL NERVE BLOCK   CORONARY ANGIOGRAPHY N/A 12/06/2017   Procedure: CORONARY ANGIOGRAPHY;  Surgeon: Lyn Records, MD;  Location: [facility] INVASIVE CV LAB;  Service: Cardiovascular;  Laterality: N/A;   CORONARY ANGIOGRAPHY N/A 09/26/2018   Procedure: CORONARY ANGIOGRAPHY;  Surgeon: Yvonne Kendall, MD;  Location: [facility] INVASIVE CV LAB;  Service: Cardiovascular;  Laterality: N/A;   CORONARY STENT INTERVENTION N/A 12/10/2017   Procedure: CORONARY STENT INTERVENTION;  Surgeon: Lennette Bihari, MD;  Location: [facility] INVASIVE CV LAB;  Service: Cardiovascular;  Laterality: N/A;   FOOT FRACTURE SURGERY Bilateral 2004-2010   "got pins in both of them"   HEMORRHOID SURGERY N/A 06/15/2015   Procedure:  HEMORRHOIDECTOMY;  Surgeon: Almond Lint, MD;  Location: [facility] OR;  Service: General;  Laterality: N/A;   INGUINAL HERNIA REPAIR Right ~ 1996   INSERTION OF DIALYSIS CATHETER N/A 09/09/2020   Procedure: INSERTION OF TUNNELED DIALYSIS PALINDROME 19cm CATHETER;  Surgeon: Leonie Douglas, MD;  Location: [facility] OR;  Service: Vascular;  Laterality: N/A;   LEFT HEART CATH AND CORONARY ANGIOGRAPHY N/A 02/05/2021   Procedure: LEFT HEART CATH AND CORONARY ANGIOGRAPHY;  Surgeon: Laurey Morale, MD;  Location: [facility] INVASIVE CV LAB;  Service: Cardiovascular;  Laterality: N/A;   LEFT HEART CATHETERIZATION WITH CORONARY ANGIOGRAM N/A 06/21/2014   Procedure: LEFT HEART CATHETERIZATION WITH CORONARY ANGIOGRAM;  Surgeon: Laurey Morale, MD;  Location[facility] CATH LAB;  Service: Cardiovascular;  Laterality: N/A;   LOOP RECORDER INSERTION N/A 06/19/2016   Procedure: Loop Recorder Insertion;  Surgeon: [facility] Salvia, MD;  Location: [facility] INVASIVE CV LAB;  Service: Cardiovascular;  Laterality: N/A;   PERIPHERAL VASCULAR BALLOON ANGIOPLASTY  03/07/2021  Procedure: PERIPHERAL VASCULAR BALLOON ANGIOPLASTY;  Surgeon: Chuck Hint, MD;  Location: [facility] INVASIVE CV LAB;  Service: Cardiovascular;;   PERIPHERAL VASCULAR BALLOON ANGIOPLASTY  07/18/2021   Procedure: PERIPHERAL VASCULAR BALLOON ANGIOPLASTY;  Surgeon: Chuck Hint, MD;  Location: [facility] INVASIVE CV LAB;  Service: Cardiovascular;;   TEE WITHOUT CARDIOVERSION N/A 03/12/2016   Procedure: TRANSESOPHAGEAL ECHOCARDIOGRAM (TEE);  Surgeon: Thurmon Fair, MD;  Location: [facility] ENDOSCOPY;  Service: Cardiovascular;  Laterality: N/A;   TEE WITHOUT CARDIOVERSION N/A 10/16/2020   Procedure: TRANSESOPHAGEAL ECHOCARDIOGRAM (TEE);  Surgeon: Laurey Morale, MD;  Location: [facility] ENDOSCOPY;  Service: Cardiovascular;  Laterality: N/A;   UPPER EXTREMITY VENOGRAPHY Right 07/18/2021   Procedure: UPPER EXTREMITY VENOGRAPHY;  Surgeon: Chuck Hint, MD;  Location: [facility] INVASIVE CV LAB;   Service: Cardiovascular;  Laterality: Right;    Family History  Problem Relation Age of Onset   Hypertension Mother    Hypertension Other    Colon cancer Paternal Uncle    Stroke Maternal Aunt    Heart attack Brother    Hypertension Brother    Diabetes Maternal Aunt    Lung cancer Maternal Uncle    Stroke Maternal Uncle    Other Sister        "breathing machine at night"   Headache Sister    Thyroid disease Sister    Throat cancer Neg Hx    Pancreatic cancer Neg Hx    Esophageal cancer Neg Hx    Kidney disease Neg Hx    Liver disease Neg Hx     Social History   Socioeconomic History   Marital status: Married    Spouse name: Not on file   Number of children: 5   Years of education: 12   Highest education level: Not on file  Occupational History   Occupation: Disabled, part-time Corporate investment banker    Comment: Disability  Tobacco Use   Smoking status: Some Days    Current packs/day: 0.25    Average packs/day: 0.3 packs/day for 23.0 years (5.8 ttl pk-yrs)    Types: Cigarettes    Passive exposure: Current   Smokeless tobacco: Never   Tobacco comments:    3 to 4 per day  Vaping Use   Vaping status: Not on file  Substance and Sexual Activity   Alcohol use: Not Currently    Alcohol/week: 1.0 standard drink of alcohol    Types: 1 Cans of beer per week    Comment: On special occasion   Drug use: Yes    Types: Marijuana    Comment: 2 times/week - last use 10/06/20   Sexual activity: Yes  Other Topics Concern   Not on file  Social History Narrative   Fun: Enjoy his children   Social Determinants of Health   Financial Resource Strain: Low Risk  (07/07/2022)   [facility]   Overall Financial Resource Strain (CARDIA)    Difficulty of Paying Living Expenses: Not hard at all  Food Insecurity: No Food Insecurity (12/20/2022)   Hunger Vital Sign    Worried About Running Out of Food in the Last Year: Never true    Ran Out of Food in the Last  Year: Never true  Transportation Needs: No Transportation Needs (12/20/2022)   PRAPARE - Administrator, Civil Service (Medical): No    Lack of Transportation (Non-Medical): No  Physical Activity: Not on file  Stress: Not on file  Social Connections: Unknown (05/11/2022)   [facility]   Social Network    Social Network: Not on file  Intimate Partner Violence: Not At Risk (12/20/2022)   Humiliation, Afraid, Rape, and Kick questionnaire    Fear of Current or Ex-Partner: No    Emotionally Abused: No    Physically Abused: No    Sexually Abused: No    Allergies  Allergen Reactions   Imdur [Isosorbide Nitrate] Other (See Comments)    Headaches -- patient instructed to continue taking    Tramadol Other (See Comments)    Headaches     Current Facility-Administered Medications  Medication Dose Route Frequency Provider Last Rate Last Admin   acetaminophen (TYLENOL) tablet 650 mg  650 mg Oral Q6H PRN Dow Adolph N, DO   650 mg at 12/20/22 1610   arformoterol (BROVANA) nebulizer solution 15 mcg  15 mcg Nebulization BID Hall, Carole N, DO       And   umeclidinium bromide (INCRUSE ELLIPTA) 62.5 MCG/ACT 1 puff  1 puff Inhalation Daily Dow Adolph N, DO       aspirin EC tablet 81 mg  81 mg Oral Daily Darlin Drop, DO   81 mg at 12/20/22 0926   atorvastatin (LIPITOR) tablet 80 mg  80 mg Oral QHS Dow Adolph N, DO   80 mg at 12/19/22 2125   clopidogrel (PLAVIX) tablet 75 mg  75 mg Oral Daily Milon Dikes, MD   75 mg at 12/20/22 0926   heparin injection 5,000 Units  5,000 Units Subcutaneous Q8H Dow Adolph N, DO   5,000 Units at 12/20/22 9604   labetalol (NORMODYNE) injection 5 mg  5 mg Intravenous Q2H PRN Dow Adolph N, DO       melatonin tablet 5 mg  5 mg Oral QHS PRN Hall, Carole N, DO       polyethylene glycol (MIRALAX / GLYCOLAX) packet 17 g  17 g Oral Daily PRN Dow Adolph N, DO       prochlorperazine (COMPAZINE) injection 5 mg  5 mg  Intravenous Q6H PRN Dow Adolph N, DO        PHYSICAL EXAM Vitals:   12/20/22 0000 12/20/22 0456 12/20/22 0542 12/20/22 0843  BP: (!) 159/74 (!) 165/99  (!) 167/92  Pulse: 64 73  73  Resp: 14 15  19   Temp: 97.9 F (36.6 C) 97.8 F (36.6 C)  (!) 97.5 F (36.4 C)  TempSrc: Axillary Oral  Oral  SpO2: 100% 100%  100%  Weight:   69.1 kg    Middle-age man in no acute distress Regular rate and rhythm Unlabored breathing Left arm brachiobasilic arteriovenous fistula with good thrill Significant left jugular venous distention with weakly palpable thrill Left eye exophthalmos  PERTINENT LABORATORY AND RADIOLOGIC DATA  Most recent CBC    Latest Ref Rng & Units 12/20/2022   10:02 AM 12/19/2022    8:04 PM 12/19/2022   11:15 AM  CBC  WBC 4.0 - 10.5 K/uL 4.1  3.8  3.9   Hemoglobin 13.0 - 17.0 g/dL 54.0  98.1  19.1   Hematocrit 39.0 - 52.0 % 34.3  35.8  30.5   Platelets 150 - 400 K/uL 80  80  74      Most recent CMP    Latest Ref Rng & Units 12/20/2022   10:02 AM 12/19/2022    8:04 PM 12/19/2022   11:15 AM  CMP  Glucose 70 - 99 mg/dL 86   78   BUN 6 - 20 mg/dL 61  65   Creatinine 0.61 - 1.24 mg/dL 1.61  0.96  0.45   Sodium 135 - 145 mmol/L 140   141   Potassium 3.5 - 5.1 mmol/L 4.5   3.8   Chloride 98 - 111 mmol/L 105   104   CO2 22 - 32 mmol/L 22   27   Calcium 8.9 - 10.3 mg/dL 9.4   9.6     Renal function Estimated Creatinine Clearance: 9.7 mL/min (A) (by C-G formula based on SCr of 8.86 mg/dL (H)).  Hgb A1c MFr Bld (%)  Date Value  12/20/2022 5.1    LDL Chol Calc (NIH)  Date Value Ref Range Status  10/09/2019 66 0 - 99 mg/dL Final   LDL Cholesterol  Date Value Ref Range Status  12/20/2022 97 0 - 99 mg/dL Final    Comment:           Total Cholesterol/HDL:CHD Risk Coronary Heart Disease Risk Table                     Men   Women  1/2 Average Risk   3.4   3.3  Average Risk       5.0   4.4  2 X Average Risk   9.6   7.1  3 X Average Risk  23.4   11.0         Use the calculated Patient Ratio above and the CHD Risk Table to determine the patient's CHD Risk.        ATP III CLASSIFICATION (LDL):  <100     mg/dL   Optimal  409-811  mg/dL   Near or Above                    Optimal  130-159  mg/dL   Borderline  914-782  mg/dL   High  >956     mg/dL   Very High Performed at [facility], [address] [place], [place] [address]     Rande Brunt. Lenell Antu, MD FACS [facility] Phone Number: [phone] 12/20/2022 11:42 AM   Total time spent on preparing this encounter including chart review, data review, collecting history, examining the patient, coordinating care for this established patient, 30 minutes.  Portions of this report may have been transcribed using voice recognition software.  Every effort has been made to ensure accuracy; however, inadvertent computerized transcription errors may still be present.

## 2022-12-20 NOTE — Evaluation (Signed)
Clinical/Bedside Swallow Evaluation Patient Details  Name: [name] MRN: [id number] Date of Birth: 1971-02-27  Today's Date: 12/20/2022 Time: SLP Start Time (ACUTE ONLY): 1341 SLP Stop Time (ACUTE ONLY): 1351 SLP Time Calculation (min) (ACUTE ONLY): 10 min  Past Medical History:  Past Medical History:  Diagnosis Date   Adenomatous colon polyp 08/2015   Anxiety    Aortic disease (HCC)    Aortic dissection (HCC)    a. admx 04/2014 >> L renal infarct; a/c renal failure >> b.  s/p Bioprosthetic [place] and total arch replacement and staged endovascular repair of descending aortic aneurysm ([facility] - Dr. Kizzie Bane)   CAD (coronary artery disease)    a. LHC 4/16:  oD1 60%   Cardiomyopathy (HCC)    a. non-ischemic - probably related to untreated HTN and ETOH abuse - Echo 3/13 with EF 35-40% >> b. Echo 4/16: Severe LVH, EF 55-60%, moderate AI, moderate MR, mild LAE, trivial effusion, known type B dissection with communication between true and false lumens with suprasternal images suggesting dissection plane may propagate to at least left subclavian takeoff, root above aortic valve okay     Chronic abdominal pain    Chronic combined systolic and diastolic congestive heart failure (HCC)    a. 05/2011: Adm with pulm edema/HTN urgency, EF 35-40% with diffuse hypokinesis and moderate to severe mitral regurgitation. Cardiomyopathy likely due to uncontrolled HTN and ETOH abuse - cath deferred due to renal insufficiency (felt due to uncontrolled HTN). bElzie Rings MV 06/2011: EF 37% and no ischemia or infarction. c. EF 45-50% by echo 01/2012.   Chronic sinusitis    CKD (chronic kidney disease)    dialysis m,t,th,fri   DDD (degenerative disc disease), lumbar    Depression    Descending thoracic aortic aneurysm (HCC)    Dissecting aneurysm of thoracic aorta (HCC)    ESRD (end stage renal disease) (HCC)    dialysis 3 x week, 8 months ago started   ETOH abuse    a. Reported to have quit 05/2011.   Frequent  headaches    GERD (gastroesophageal reflux disease)    Headache(784.0)    "q other day" (08/08/2013)   Heart murmur    Hemorrhoid thrombosis    History of echocardiogram    Echo 1/17:  Severe LVH, EF 55-60%, no RWMA, Gr 2 DD, AVR ok, mild to mod MR, mild LAE, mild reduced RVSF, mod RAE   History of medication noncompliance    HYPERLIPIDEMIA    Hypertension    a. Hx of HTN urgency secondary to noncompliance. b. urinary metanephrine and catecholeamine levels normal 2013.  c. Renal art [name] 1/16:  No evidence of renal artery stenosis noted bilaterally.   INGUINAL HERNIA    Pneumonia ~ 2013   Renal insufficiency    Serrated adenoma of colon 08/2015   Stroke [facility])    Tobacco abuse    Valvular heart disease    a. Echo 05/2011: moderate to severe eccentric MR and mild to moderate AI with prolapsing left coronary cusp. b. Echo 01/2012: mild-mod AI, mild dilitation of aortic root, mild MR.;  c. Echo 1/16: Severe LVH consistent with hypertrophic cardio myopathy, EF 50%, no RWMA, mod AI, mild MR, mild RAE, dilated Ao root (40 mm);    Past Surgical History:  Past Surgical History:  Procedure Laterality Date   A/V FISTULAGRAM Left 03/07/2021   Procedure: A/V FISTULAGRAM;  Surgeon: Chuck Hint, MD;  Location: [facility] INVASIVE CV LAB;  Service: Cardiovascular;  Laterality:  Left;   A/V FISTULAGRAM Left 07/18/2021   Procedure: A/V Fistulagram;  Surgeon: Chuck Hint, MD;  Location: [facility] INVASIVE CV LAB;  Service: Cardiovascular;  Laterality: Left;   ANKLE SURGERY Bilateral    Fractures bilaterally   AORTIC VALVE SURGERY  09/2014   AV FISTULA PLACEMENT Left 09/09/2020   Procedure: LEFT ARM ARTERIOVENOUS (AV) FISTULA CREATION;  Surgeon: Leonie Douglas, MD;  Location: [facility] OR;  Service: Vascular;  Laterality: Left;   BASCILIC VEIN TRANSPOSITION Left 10/09/2020   Procedure: LEFT SECOND STAGE BASILIC VEIN TRANSPOSITION;  Surgeon: Leonie Douglas, MD;  Location: [facility] OR;  Service: Vascular;   Laterality: Left;  PERIPHERAL NERVE BLOCK   CORONARY ANGIOGRAPHY N/A 12/06/2017   Procedure: CORONARY ANGIOGRAPHY;  Surgeon: Lyn Records, MD;  Location: [facility] INVASIVE CV LAB;  Service: Cardiovascular;  Laterality: N/A;   CORONARY ANGIOGRAPHY N/A 09/26/2018   Procedure: CORONARY ANGIOGRAPHY;  Surgeon: Yvonne Kendall, MD;  Location: [facility] INVASIVE CV LAB;  Service: Cardiovascular;  Laterality: N/A;   CORONARY STENT INTERVENTION N/A 12/10/2017   Procedure: CORONARY STENT INTERVENTION;  Surgeon: Lennette Bihari, MD;  Location: [facility] INVASIVE CV LAB;  Service: Cardiovascular;  Laterality: N/A;   FOOT FRACTURE SURGERY Bilateral 2004-2010   "got pins in both of them"   HEMORRHOID SURGERY N/A 06/15/2015   Procedure: HEMORRHOIDECTOMY;  Surgeon: Almond Lint, MD;  Location: [facility] OR;  Service: General;  Laterality: N/A;   INGUINAL HERNIA REPAIR Right ~ 1996   INSERTION OF DIALYSIS CATHETER N/A 09/09/2020   Procedure: INSERTION OF TUNNELED DIALYSIS PALINDROME 19cm CATHETER;  Surgeon: Leonie Douglas, MD;  Location: [facility] OR;  Service: Vascular;  Laterality: N/A;   LEFT HEART CATH AND CORONARY ANGIOGRAPHY N/A 02/05/2021   Procedure: LEFT HEART CATH AND CORONARY ANGIOGRAPHY;  Surgeon: Laurey Morale, MD;  Location: [facility] INVASIVE CV LAB;  Service: Cardiovascular;  Laterality: N/A;   LEFT HEART CATHETERIZATION WITH CORONARY ANGIOGRAM N/A 06/21/2014   Procedure: LEFT HEART CATHETERIZATION WITH CORONARY ANGIOGRAM;  Surgeon: Laurey Morale, MD;  Location: [facility] CATH LAB;  Service: Cardiovascular;  Laterality: N/A;   LOOP RECORDER INSERTION N/A 06/19/2016   Procedure: Loop Recorder Insertion;  Surgeon: [facility] Salvia, MD;  Location: [facility] INVASIVE CV LAB;  Service: Cardiovascular;  Laterality: N/A;   PERIPHERAL VASCULAR BALLOON ANGIOPLASTY  03/07/2021   Procedure: PERIPHERAL VASCULAR BALLOON ANGIOPLASTY;  Surgeon: Chuck Hint, MD;  Location: [name] INVASIVE CV LAB;  Service: Cardiovascular;;   PERIPHERAL VASCULAR BALLOON  ANGIOPLASTY  07/18/2021   Procedure: PERIPHERAL VASCULAR BALLOON ANGIOPLASTY;  Surgeon: Chuck Hint, MD;  Location: [facility] INVASIVE CV LAB;  Service: Cardiovascular;;   TEE WITHOUT CARDIOVERSION N/A 03/12/2016   Procedure: TRANSESOPHAGEAL ECHOCARDIOGRAM (TEE);  Surgeon: Thurmon Fair, MD;  Location: [facility] ENDOSCOPY;  Service: Cardiovascular;  Laterality: N/A;   TEE WITHOUT CARDIOVERSION N/A 10/16/2020   Procedure: TRANSESOPHAGEAL ECHOCARDIOGRAM (TEE);  Surgeon: Laurey Morale, MD;  Location: [facility] ENDOSCOPY;  Service: Cardiovascular;  Laterality: N/A;   UPPER EXTREMITY VENOGRAPHY Right 07/18/2021   Procedure: UPPER EXTREMITY VENOGRAPHY;  Surgeon: Chuck Hint, MD;  Location: [facility] INVASIVE CV LAB;  Service: Cardiovascular;  Laterality: Right;   HPI:  [name] is a 52 yo male presenting to ED 10/5 with sudden onset dizziness. CTA with vascular abnormalities to posterior circulation. MRI Brain negative. Pt previously seen by ST to address cognitive linguistic goals in 2018. PMH includes CAD s/p CABG, MI x2, prior CVA x3, ESRD on HD 1x/week, descending thoracic aortic aneurysm, history  of aortic valve replacement, HLD, HTN, tobacco use, severe L jugular venous distention x8 months    Assessment / Plan / Recommendation  Clinical Impression  Pt reports no prior difficulty swallowing. Oral motor exam WFL. Pt wears top and bottom dentures and reports they are loose and he typically removes them when eating. He states he has no trouble with mastication even when dentures are doffed. Observed pt with thin liquids and regular texture solids from his lunch tray with no signs clinically concerning for dysphagia or aspiration. Recommend continuing current diet. No further needs identified at this time. SLP will s/o. SLP Visit Diagnosis: Dysphagia, unspecified (R13.10)    Aspiration Risk  No limitations    Diet Recommendation Regular;Thin liquid    Liquid Administration via: Cup;Straw Medication  Administration: Whole meds with liquid Supervision: Patient able to self feed Compensations: Slow rate;Small sips/bites Postural Changes: Seated upright at 90 degrees    Other  Recommendations Oral Care Recommendations: Oral care BID    Recommendations for follow up therapy are one component of a multi-disciplinary discharge planning process, led by the attending physician.  Recommendations may be updated based on patient status, additional functional criteria and insurance authorization.  Follow up Recommendations No SLP follow up      Assistance Recommended at Discharge    Functional Status Assessment Patient has not had a recent decline in their functional status  Frequency and Duration            Prognosis Prognosis for improved oropharyngeal function: Good      Swallow Study   General HPI: LAVEL ATLEE is a 52 yo male presenting to ED 10/5 with sudden onset dizziness. CTA with vascular abnormalities to posterior circulation. MRI Brain negative. Pt previously seen by ST to address cognitive linguistic goals in 2018. PMH includes CAD s/p CABG, MI x2, prior CVA x3, ESRD on HD 1x/week, descending thoracic aortic aneurysm, history of aortic valve replacement, HLD, HTN, tobacco use, severe L jugular venous distention x8 months Type of Study: Bedside Swallow Evaluation Previous Swallow Assessment: none in chart Diet Prior to this Study: Regular;Thin liquids (Level 0) Temperature Spikes Noted: No Respiratory Status: Room air History of Recent Intubation: No Behavior/Cognition: Alert;Cooperative;Pleasant mood Oral Cavity Assessment: Within Functional Limits Oral Care Completed by SLP: No Oral Cavity - Dentition: Dentures, top;Dentures, bottom Vision: Functional for self-feeding Self-Feeding Abilities: Able to feed self Patient Positioning: Upright in bed Baseline Vocal Quality: Normal Volitional Cough: Strong Volitional Swallow: Able to elicit    Oral/Motor/Sensory Function  Overall Oral Motor/Sensory Function: Within functional limits   Ice Chips Ice chips: Not tested   Thin Liquid Thin Liquid: Within functional limits Presentation: Straw;Self Fed    Nectar Thick Nectar Thick Liquid: Not tested   Honey Thick Honey Thick Liquid: Not tested   Puree Puree: Not tested   Solid     Solid: Within functional limits Presentation: Spoon;Self Fed      Gwynneth Aliment, M.A., CF-SLP Speech Language Pathology, Acute Rehabilitation Services  Secure Chat preferred [phone]  12/20/2022,1:56 PM

## 2022-12-20 NOTE — Progress Notes (Signed)
Patient requests to leave AMA states "I have pain where the heparin subcutaneous shots have been given in my abdomen and have rib pain from old fractures and do not want to stay here anymore". Pt refusing tylenol when offered. Patient is neurologically intact , no deficits. Alert, aware, ambulatory. No c/o dizziness or vertigo. Dr Arlean Hopping informed and request patient sign AMA form. Risks discussed with patient including cancel of fistulogram in am. Patient states will follow up this week with primary MD. AMA form signed. PIV removed from right forearm without incidence.

## 2022-12-20 NOTE — Evaluation (Signed)
Occupational Therapy Evaluation Patient Details Name: [name] MRN: [id number] DOB: 1970-12-09 Today's Date: 12/20/2022   History of Present Illness Pt is a 52 y/o male presenting on 10/5 with sudden onset dizziness. CT with significant vascular abnormalities in posterior circulation, stable L MCA territory infarct. MRI negative. PMH includes: CAD s/p CABG, MI, CVA, ESRD on HD Tuesdays, descending thoracic aortic aneurysm, AVR, HTN, tobacco use disorder, L jugular venous distention.   Clinical Impression   PTA patient independent and driving. Admitted for above and presents with problem list below, including dizziness, mild instability and decreased activity tolerance.  Pt completing ADLs and transfers/functional mobility in room without AD and min guard assist for safety.  Intermittent cueing for pacing and safety, using focused gaze techniques with minimal cueing.  Pt reports focusing his gaze is helping with mobility.  Anticipate he will progress well with no further OT needs identified after dc home.  Will follow acutely.     If plan is discharge home, recommend the following: A little help with walking and/or transfers;A little help with bathing/dressing/bathroom;Assistance with cooking/housework;Assist for transportation;Help with stairs or ramp for entrance    Functional Status Assessment  Patient has had a recent decline in their functional status and demonstrates the ability to make significant improvements in function in a reasonable and predictable amount of time.  Equipment Recommendations  BSC/3in1    Recommendations for Other Services       Precautions / Restrictions Precautions Precautions: Fall Precaution Comments: dizziness Restrictions Weight Bearing Restrictions: No      Mobility Bed Mobility Overal bed mobility: Needs Assistance Bed Mobility: Rolling, Sidelying to Sit, Sit to Sidelying Rolling: Supervision Sidelying to sit: Supervision     Sit to  sidelying: Supervision General bed mobility comments: supervision for safety, cueing for focused gaze    Transfers Overall transfer level: Needs assistance Equipment used: None Transfers: Sit to/from Stand Sit to Stand: Contact guard assist           General transfer comment: for safety, no loss of balance      Balance Overall balance assessment: Mild deficits observed, not formally tested                                         ADL either performed or assessed with clinical judgement   ADL Overall ADL's : Needs assistance/impaired     Grooming: Set up;Sitting           Upper Body Dressing : Contact guard assist;Sitting   Lower Body Dressing: Contact guard assist;Sit to/from stand   Toilet Transfer: Contact guard assist;Ambulation           Functional mobility during ADLs: Contact guard assist General ADL Comments: pt limited due to dizziness, but utilized focused gaze technique and able to tolerate ADLs with min guard     Vision Baseline Vision/History: 1 Wears glasses Ability to See in Adequate Light: 0 Adequate Patient Visual Report: No change from baseline Vision Assessment?: No apparent visual deficits Additional Comments: wears glasses at home but typically     Perception         Praxis         Pertinent Vitals/Pain Pain Assessment Pain Assessment: No/denies pain     Extremity/Trunk Assessment Upper Extremity Assessment Upper Extremity Assessment: Overall WFL for tasks assessed;Right hand dominant   Lower Extremity Assessment Lower Extremity Assessment: Defer to PT  evaluation   Cervical / Trunk Assessment Cervical / Trunk Assessment: Normal   Communication Communication Communication: No apparent difficulties   Cognition Arousal: Alert Behavior During Therapy: WFL for tasks assessed/performed Overall Cognitive Status: Within Functional Limits for tasks assessed                                        General Comments       Exercises     Shoulder Instructions      Home Living Family/patient expects to be discharged to:: Private residence Living Arrangements: Spouse/significant other (daughter) Available Help at Discharge: Family;Available 24 hours/day Type of Home: House Home Access: Stairs to enter [employer] of Steps: 4 Entrance Stairs-Rails: Can reach both Home Layout: One level     Bathroom Shower/Tub: Chief Strategy Officer: Standard     Home Equipment: None          Prior Functioning/Environment Prior Level of Function : Independent/Modified Independent;Driving;Working/employed               ADLs Comments: works odd jobs, on disability        OT Problem List: Decreased activity tolerance;Impaired balance (sitting and/or standing);Decreased safety awareness;Decreased knowledge of use of DME or AE;Decreased knowledge of precautions      OT Treatment/Interventions: Self-care/ADL training;DME and/or AE instruction;Therapeutic activities;Balance training;Patient/family education    OT Goals(Current goals can be found in the care plan section) Acute Rehab OT Goals Patient Stated Goal: feel better OT Goal Formulation: With patient Time For Goal Achievement: 01/03/23 Potential to Achieve Goals: Good  OT Frequency: Min 1X/week    Co-evaluation              AM-PAC OT "6 Clicks" Daily Activity     Outcome Measure Help from another person eating meals?: None Help from another person taking care of personal grooming?: A Little Help from another person toileting, which includes using toliet, bedpan, or urinal?: A Little Help from another person bathing (including washing, rinsing, drying)?: A Little Help from another person to put on and taking off regular upper body clothing?: A Little Help from another person to put on and taking off regular lower body clothing?: A Little 6 Click Score: 19   End of Session Nurse  Communication: Mobility status  Activity Tolerance: Patient tolerated treatment well Patient left: in bed;with call bell/phone within reach;with chair alarm set;with nursing/sitter in room  OT Visit Diagnosis: Unsteadiness on feet (R26.81);Dizziness and giddiness (R42)                Time: 6962-9528 OT Time Calculation (min): 28 min Charges:  OT General Charges $OT Visit: 1 Visit OT Evaluation $OT Eval Moderate Complexity: 1 Mod OT Treatments $Self Care/Home Management : 8-22 mins  Barry Brunner, OT Acute Rehabilitation Services Office [phone]   Chancy Milroy 12/20/2022, 10:39 AM

## 2022-12-20 NOTE — Progress Notes (Addendum)
PROGRESS NOTE    [name]  UJW:[id number] DOB: [birth year] DOA: 12/19/2022 PCP: Andre Back, NP  No chief complaint on file.   Brief Narrative:   [name] is [name] 52 y.o. male with medical history significant for coronary artery disease status post CABG, 2 MIs, 3 CVAs, ESRD on hemodialysis once [name] week, on Tuesdays, descending thoracic aortic aneurysm, history of aortic valve replacement with bioprosthetic valve on statin and aspirin daily, hyperlipidemia, hypertension, tobacco use disorder, severe left jugular venous distention for the past 8 months, who initially presented to [facility] with complaints of sudden onset dizziness around 5 PM on 12/18/2022.  He went to bed that evening and woke up around 2 AM this morning with recurrence of the dizziness to the point where he had to hold onto things in order to walk.  Associated with sensation of room spinning and feeling off balance.  This prompted him to come to the ED around 9 AM today for further evaluation.   In the ED, hypertensive with persistent dizziness.  CT angio revealed significant vascular abnormalities to the posterior circulation.  EDP discussed the case with neurology/stroke team on-call who recommended admission for stroke workup.  Admitted by [name], hospitalist service.  Accepted by Dr. Jacqulyn Ewing.   The patient was seen and examined at his bedside.  Continues to have dizziness while laying in bed.  Ongoing stroke workup with brain MRI ordered and pending.  Seen by neurology/stroke team, appreciate assistance.   ED Course: Temperature 99.1.  BP 171/87, pulse 75, respiratory 17, saturation 98% on room air.  Lab studies notable for BUN 65, creatinine 9.02, GFR 7.  WBC 3.9, hemoglobin 10.3, platelet count 74.  Assessment & Plan:   Principal Problem:   Vertigo  Vertigo Negative MRI brain CTA with remote L MCA territory infarct, stable remote infarct of R middle frontal gyrus, stable atrophy and white matter disease.  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  Fusiform dilation of more V4 segment extending and proximal basilar artery increased since prior study.  High grade stenosis of distal R p2 segment is stable.  Irregularity of distal PCA branch vesels bilaterally.  Focal fusiform dilation of the distal R M1 segment has progressed.  Aortic graft stent.  Prominent venous engorgement within L side of the neck.  Positive dix hallpike with therapy, now s/p epley maneuver, will follow response Neurology c/s, appreciate recs - concern for acute vascular vestibular vertigo - currently recommending DAPT x3 months followed by aspirin alone Echo pending A1c 5.1, LDL 97 PT/OT/SLP Permissive hypertension .   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   Hypertension, BP is not at goal, elevated Allow for permissive hypertension if acute CVA is present Otherwise resume home oral antihypertensives Closely monitor vital signs   Hyperlipidemia Resume home regimen   ESRD on hemodialysis once [name] week, Tuesdays Nephrology consulted to assist with the management will discuss in the AM Volume and electrolytes managed with hemodialysis Patient still makes urine   History of left internal jugular vein thromboembolism Not on anticoagulation prior to admission.   COPD, not on oxygen  supplementation at baseline. No acute issues Resume home bronchodilators   Tobacco use disorder Counseled on the importance of tobacco cessation States he smokes several cigarettes,  about 7 per day   Chronic anxiety/depression Resume home regimen   HFpEF with grade 1 diastolic dysfunction Monitor volume status with HD and with strict I's and O's and daily weight Follow 2D echo   Chronic pancytopenia, at baseline Leukopenia, anemia and thrombocytopenia No evidence of bleeding or acute infective process. Monitor for now     DVT prophylaxis: heparin Code Status: full Family Communication: none Disposition:   Status is: Observation The patient remains OBS appropriate and will d/c before 2 midnights.   Consultants:  Neurology vascular  Procedures:  none  Antimicrobials:  Anti-infectives (From admission, onward)    None       Subjective: C/o vertigo   Objective: Vitals:   12/20/22 0000 12/20/22 0456 12/20/22 0542 12/20/22 0843  BP: (!) 159/74 (!) 165/99  (!) 167/92  Pulse: 64 73  73  Resp: 14 15  19   Temp: 97.9 F (36.6 C) 97.8 F (36.6 C)  (!) 97.5 F (36.4 C)  TempSrc: Axillary Oral  Oral  SpO2: 100% 100%  100%  Weight:   69.1 kg     Intake/Output Summary (Last 24 hours) at 12/20/2022 1421 Last data filed at 12/20/2022 0536 Gross per 24 hour  Intake --  Output 250 ml  Net -250 ml   Filed Weights   12/20/22 0542  Weight: 69.1 kg    Examination:  General exam: Appears calm and comfortable  Respiratory system: unlabored Cardiovascular system: RRR Central nervous system: Alert and oriented. C/o vertigo when sitting up. Extremities: no LEE   Data Reviewed: I have personally reviewed following labs and imaging studies  CBC: Recent Labs  Lab 12/19/22 1115 12/19/22 2004 12/20/22 1002  WBC 3.9* 3.8* 4.1  HGB 10.3* 11.4* 10.9*  HCT 30.5* 35.8* 34.3*  MCV 97.1 96.5 96.9  PLT 74* 80* 80*    Basic Metabolic Panel: Recent Labs  Lab 12/19/22 1115 12/19/22 2004 12/20/22 1002  NA 141  --  140  K 3.8  --  4.5  CL 104  --  105  CO2 27  --  22  GLUCOSE 78  --  86  BUN 65*  --  61*  CREATININE 9.02* 8.75* 8.86*   CALCIUM 9.6  --  9.4  PHOS  --   --  5.3*    GFR: Estimated Creatinine Clearance: 9.7 mL/min ([name]) (by C-G formula based on SCr of 8.86 mg/dL (H)).  Liver Function Tests: Recent Labs  Lab 12/20/22 1002  ALBUMIN 3.6    CBG: Recent Labs  Lab 12/19/22 1004  GLUCAP 86     No results found for this or any previous visit (from the past 240 hour(s)).       Radiology Studies: MR BRAIN WO CONTRAST  Result Date: 12/20/2022 CLINICAL DATA:  Syncope EXAM: MRI HEAD WITHOUT CONTRAST TECHNIQUE: Multiplanar, multiecho pulse sequences of the brain and surrounding structures were obtained without intravenous contrast. COMPARISON:  None Available. FINDINGS: Brain: No acute infarct, mass effect or extra-axial collection. There is multifocal hyperintense T2-weighted signal within the white matter. Parenchymal volume and CSF spaces are normal. Old left parietal infarct. The midline structures are normal. Vascular: Vertebrobasilar dolichoectasia, unchanged. Flow voids are preserved. Skull and upper cervical spine: Normal calvarium and skull base. Visualized upper cervical spine and soft tissues are normal. Sinuses/Orbits:Trace left mastoid fluid. Mild bilateral maxillary sinus  mucosal thickening. Normal orbits. IMPRESSION: 1. No acute intracranial abnormality. 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 3. Chronic small vessel ischemia. Electronically Signed   By: Deatra Robinson M.D.   On: 12/20/2022 02:02   CT ANGIO HEAD NECK W WO CM  Result Date: 12/19/2022 CLINICAL DATA:  Vertigo, central. EXAM: CT ANGIOGRAPHY HEAD AND NECK WITH AND WITHOUT CONTRAST TECHNIQUE: Multidetector CT imaging of the head and neck was performed using the standard protocol during bolus administration of intravenous contrast. Multiplanar CT image reconstructions and MIPs were obtained to evaluate the vascular anatomy. Carotid stenosis measurements (when applicable) are obtained utilizing NASCET criteria, using the distal internal carotid  diameter as the denominator. RADIATION DOSE REDUCTION: This exam was performed according to the departmental dose-optimization program which includes automated exposure control, adjustment of the mA and/or kV according to patient size and/or use of iterative reconstruction technique. CONTRAST:  75mL OMNIPAQUE IOHEXOL 350 MG/ML SOLN COMPARISON:  CT head without contrast 04/27/2022. MR head without contrast 03/03/2016. CT angio head 02/28/2016. FINDINGS: CT HEAD FINDINGS Brain: The remote left MCA territory infarct is again noted. Nigil Braman remote infarct of the right middle frontal gyrus is also stable. Periventricular white matter changes are present. No acute infarct, hemorrhage, or mass lesion is present. The ventricles are of normal size. No significant extraaxial fluid collection is present. The brainstem is slightly displaced to the right secondary to enlarged and tortuous left vertebral artery. Brainstem and cerebellum are otherwise within. Vascular: Atherosclerotic calcifications are present the left vertebral artery and proximal basilar artery. Calcifications are also present within the cavernous internal carotid arteries bilaterally. No hyperdense vessel is present. Skull: Calvarium is intact. No focal lytic or blastic lesions are present. No significant extracranial soft tissue lesion is present. Sinuses/Orbits: Cuca Benassi remote right medial orbital blowout fracture is stable. Paranasal sinuses and mastoid air cells are clear. The globes and orbits are within normal limits. Review of the MIP images confirms the above findings CTA NECK FINDINGS Aortic arch: Aortic stent graft is in place. Great vessel origins are proximal to the graft. Right carotid system: Minimal atherosclerotic changes are present at the right carotid bifurcation without significant stenosis. Moderate tortuosity is present in the cervical right ICA without significant stenosis. Left carotid system: The left common carotid artery is within normal limits.  Mild atherosclerotic changes are present at the bifurcation without significant stenosis. Moderate tortuosity is present cervical left ICA without significant stenosis. Vertebral arteries: The right vertebral artery is dominant vessel. Both vertebral arteries originate from the subclavian arteries without significant stenosis. The left vertebral artery is occluded proximally positive sore on enhances within and around the left foramina transversarium. Shonnie Poudrier hypoplastic distal V3 segment is reconstituted at the level of C1-2. Skeleton: Mild degenerative changes are present in the cervical spine. No focal osseous lesions are present. Other neck: Soft tissues the neck are otherwise unremarkable. Salivary glands are within normal limits. Thyroid is normal. No significant adenopathy is present. No focal mucosal or submucosal lesions are present. Prominent venous engorgement is present within the left side of the neck. Upper chest: Centrilobular emphysematous changes are present. Mild dependent atelectasis is present bilaterally. Review of the MIP images confirms the above findings CTA HEAD FINDINGS Anterior circulation: Minimal atherosclerotic calcifications are present within the left cavernous internal carotid artery without significant stenosis. The internal carotid arteries are within normal limits from the skull base to the ICA termini otherwise. Focal fusiform dilation of the distal right M1 segment is noted. The left A1 segment is dominant.  Both A2 segments fill from the left. The left M1 segment is normal. Is the ACA and MCA branch vessels are normal bilaterally. Posterior circulation: The left vertebral artery is smaller at the dural margin than on the prior study. Fusiform dilation in the more distal V4 segment extending and proximal basilar artery has increased since the prior study. Basilar artery measures 8.5 mm maximally. The PICA origins are visualized and normal bilaterally. The right vertebral artery is  within normal limits. The superior cerebellar arteries are patent. Both posterior cerebral arteries originate basilar tip. Willim Turnage high-grade stenosis is present in the distal right P2 segment. Irregularity of the PCA branch vessels is present bilaterally. Venous sinuses: The dural sinuses are patent. The straight sinus and deep cerebral veins are intact. Cortical veins are within normal limits. No significant vascular malformation is evident. Anatomic variants: None Review of the MIP images confirms the above findings IMPRESSION: 1. Stable remote left MCA territory infarct. 2. Stable remote infarct of the right middle frontal gyrus. 3. Stable atrophy and white matter disease. No acute intracranial abnormality. 4. 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 5. Fusiform dilation of the more V4 segment extending and proximal basilar artery has increased since the prior study. 6. High-grade stenosis of the distal right P2 segment is stable. 7. Irregularity of the distal PCA branch vessels bilaterally. 8. Focal fusiform dilation of the distal right M1 segment has progressed, measuring up to 6 mm. 9. Aortic stent graft in place. 10. Prominent venous engorgement within the left side of the neck. 11.  Emphysema (ICD10-J43.9). Electronically Signed   By: Marin Roberts M.D.   On: 12/19/2022 12:53        Scheduled Meds:  arformoterol  15 mcg Nebulization BID   And   umeclidinium bromide  1 puff Inhalation Daily   aspirin EC  81 mg Oral Daily   atorvastatin  80 mg Oral QHS   clopidogrel  75 mg Oral Daily   heparin  5,000 Units Subcutaneous Q8H   Continuous Infusions:   LOS: 0 days    Time spent: over 30 min    Lacretia Nicks, MD [facility]   To contact the attending provider between 7A-7P or the covering provider during after hours 7P-7A, please log into the web site [email or web address] and access using universal [place] password for that  web site. If you do not have the password, please call the [facility] operator.  12/20/2022, 2:21 PM

## 2022-12-20 NOTE — Evaluation (Signed)
Physical Therapy Evaluation Patient Details Name: [name] MRN: [id number] DOB: 1971/01/21 Today's Date: 12/20/2022  History of Present Illness  Pt is a 52 y/o male presenting on 10/5 with sudden onset dizziness. CT with significant vascular abnormalities in posterior circulation, stable L MCA territory infarct. MRI negative. PMH includes: CAD s/p CABG, MI, CVA, ESRD on HD Tuesdays, descending thoracic aortic aneurysm, AVR, HTN, tobacco use disorder, L jugular venous distention.  Clinical Impression  Pt is presenting at ~ Mod I with bed mobility and sit to stand due to vertigo. Pt states he feels that his head is spinning and dizziness increases with sitting up from supine and sitting down from standing. Pt was positive for L Dix-Hallpike. Epley performed 3x with pt reporting relaxation after 3rd attempt with bed in reverse incline and increased time in each position greater than 1 min after end of symptoms. Pt educated on precautions following Epley maneuver. Currently recommending skilled physical therapy 3x/weekly on discharge with a vestibular specialist in order to address residual vertigo. Pt tolerated treatment session well; no nausea reported. Pt was negative for orthostatics.       If plan is discharge home, recommend the following: Help with stairs or ramp for entrance;A little help with walking and/or transfers;Assist for transportation     Equipment Recommendations None recommended by PT     Functional Status Assessment Patient has had a recent decline in their functional status and demonstrates the ability to make significant improvements in function in a reasonable and predictable amount of time.     Precautions / Restrictions Precautions Precautions: Fall Precaution Comments: dizziness Restrictions Weight Bearing Restrictions: No      Mobility  Bed Mobility Overal bed mobility: Modified Independent Bed Mobility: Supine to Sit, Sit to Supine Rolling: Modified  independent (Device/Increase time)   Supine to sit: Modified independent (Device/Increase time) Sit to supine: Modified independent (Device/Increase time)   General bed mobility comments: Slow movements due to vertigo, improved after Epley    Transfers Overall transfer level: Modified independent Equipment used: None Transfers: Sit to/from Stand Sit to Stand: Modified independent (Device/Increase time)           General transfer comment: pt was slow due to vertigo no LOB and no AD    Ambulation/Gait     General Gait Details: Deferred today for vestibular evaluation  Stairs Stairs:  (Deferred today for vestibular evaluation.)             Balance Overall balance assessment: Mild deficits observed, not formally tested         Pertinent Vitals/Pain Pain Assessment Pain Assessment: 0-10 Pain Score: 6  Pain Location: L brachium Pain Descriptors / Indicators: Aching, Constant Pain Intervention(s): Monitored during session    Home Living Family/patient expects to be discharged to:: Private residence Living Arrangements: Spouse/significant other (daughter) Available Help at Discharge: Family;Available 24 hours/day Type of Home: House Home Access: Stairs to enter Entrance Stairs-Rails: Can reach both Entrance Stairs-Number of Steps: 4   Home Layout: One level Home Equipment: None      Prior Function Prior Level of Function : Independent/Modified Independent;Driving;Working/employed               ADLs Comments: works odd jobs, on disability     Extremity/Trunk Assessment   Upper Extremity Assessment Upper Extremity Assessment: Defer to OT evaluation    Lower Extremity Assessment Lower Extremity Assessment: Overall WFL for tasks assessed    Cervical / Trunk Assessment Cervical / Trunk Assessment: Normal  Communication   Communication Communication: No apparent difficulties  Cognition Arousal: Alert Behavior During Therapy: WFL for tasks  assessed/performed Overall Cognitive Status: Within Functional Limits for tasks assessed        General Comments General comments (skin integrity, edema, etc.): orthostatics: Supine: 161/84, Sitting: 171/89, Standing:  173/97   Dizziness with supine to sitting, looking down and horizontal head movements. Pt states his head feels like it is spinning. VOR testing negative , VIsual tracking WNL, VOR Cancellation positive for horizontal and positive for vertical with vertical symptoms > than horizontal. Negative horizontal canal testing. Positive for torsional nystagmus with L posterior canal testing; Epley performed on the L 3x both initial movements resulted in torsional nystagmus delayed initially and quick for the second and third attempt used bed tilt for 2nd attempt and bed tilt with increased time for 3rd attempt. Pt reports he feels relaxed with no vertigo on last attempt. pt educated to stay propped up for 2 nights, avoid extreme head movements and sleeping on L side for 1 week.        Assessment/Plan    PT Assessment Patient needs continued PT services  PT Problem List Other (comment) (dizziness)       PT Treatment Interventions Functional mobility training;Balance training;Patient/family education;Therapeutic exercise;Neuromuscular re-education;Therapeutic activities;Canalith reposition    PT Goals (Current goals can be found in the Care Plan section)  Acute Rehab PT Goals Patient Stated Goal: Decrease dizziness PT Goal Formulation: With patient Time For Goal Achievement: 01/03/23 Potential to Achieve Goals: Good    Frequency Min 1X/week        AM-PAC PT "6 Clicks" Mobility  Outcome Measure Help needed turning from your back to your side while in a flat bed without using bedrails?: None Help needed moving from lying on your back to sitting on the side of a flat bed without using bedrails?: None Help needed moving to and from a bed to a chair (including a wheelchair)?:  None Help needed standing up from a chair using your arms (e.g., wheelchair or bedside chair)?: None Help needed to walk in hospital room?: A Little Help needed climbing 3-5 steps with a railing? : A Little 6 Click Score: 22    End of Session   Activity Tolerance: Patient tolerated treatment well Patient left: in bed;with call bell/phone within reach Nurse Communication: Mobility status PT Visit Diagnosis: Dizziness and giddiness (R42)    Time: 1205-1310 PT Time Calculation (min) (ACUTE ONLY): 65 min   Charges:   PT Evaluation $PT Eval Low Complexity: 1 Low PT Treatments $Therapeutic Activity: 23-37 mins $Canalith Rep Proc: 8-22 mins PT General Charges $$ ACUTE PT VISIT: 1 Visit         Harrel Carina, DPT, CLT  Acute Rehabilitation Services Office: [phone] (Secure chat preferred)   Claudia Desanctis 12/20/2022, 1:20 PM

## 2022-12-20 NOTE — Care Management Obs Status (Signed)
MEDICARE OBSERVATION STATUS NOTIFICATION   Patient Details  Name: MANJINDER BREAU MRN: [id number] Date of Birth: 1970/07/26   Medicare Observation Status Notification Given:  Yes    Lawerance Sabal, RN 12/20/2022, 10:33 AM

## 2022-12-21 ENCOUNTER — Emergency Department (HOSPITAL_COMMUNITY): Payer: 59

## 2022-12-21 ENCOUNTER — Emergency Department (HOSPITAL_COMMUNITY)
Admission: EM | Admit: 2022-12-21 | Discharge: 2022-12-21 | Disposition: A | Discharge status: Home / Self Care | Payer: 59 | Attending: Emergency Medicine | Admitting: Emergency Medicine

## 2022-12-21 DIAGNOSIS — Z7982 Long term (current) use of aspirin: Secondary | ICD-10-CM | POA: Insufficient documentation

## 2022-12-21 DIAGNOSIS — N186 End stage renal disease: Secondary | ICD-10-CM | POA: Diagnosis not present

## 2022-12-21 DIAGNOSIS — R0781 Pleurodynia: Secondary | ICD-10-CM | POA: Insufficient documentation

## 2022-12-21 DIAGNOSIS — Z992 Dependence on renal dialysis: Secondary | ICD-10-CM | POA: Diagnosis not present

## 2022-12-21 DIAGNOSIS — R42 Dizziness and giddiness: Secondary | ICD-10-CM | POA: Insufficient documentation

## 2022-12-21 DIAGNOSIS — R0789 Other chest pain: Secondary | ICD-10-CM

## 2022-12-21 DIAGNOSIS — I120 Hypertensive chronic kidney disease with stage 5 chronic kidney disease or end stage renal disease: Secondary | ICD-10-CM | POA: Diagnosis not present

## 2022-12-21 LAB — CBC WITH DIFFERENTIAL/PLATELET
Abs Immature Granulocytes: 0.02 10*3/uL (ref 0.00–0.07)
Basophils Absolute: 0 10*3/uL (ref 0.0–0.1)
Basophils Relative: 1 %
Eosinophils Absolute: 0.1 10*3/uL (ref 0.0–0.5)
Eosinophils Relative: 3 %
HCT: 35.8 % — ABNORMAL LOW (ref 39.0–52.0)
Hemoglobin: 11.5 g/dL — ABNORMAL LOW (ref 13.0–17.0)
Immature Granulocytes: 1 %
Lymphocytes Relative: 29 %
Lymphs Abs: 1 10*3/uL (ref 0.7–4.0)
MCH: 32.2 pg (ref 26.0–34.0)
MCHC: 32.1 g/dL (ref 30.0–36.0)
MCV: 100.3 fL — ABNORMAL HIGH (ref 80.0–100.0)
Monocytes Absolute: 0.3 10*3/uL (ref 0.1–1.0)
Monocytes Relative: 10 %
Neutro Abs: 2 10*3/uL (ref 1.7–7.7)
Neutrophils Relative %: 56 %
Platelets: 80 10*3/uL — ABNORMAL LOW (ref 150–400)
RBC: 3.57 MIL/uL — ABNORMAL LOW (ref 4.22–5.81)
RDW: 12.6 % (ref 11.5–15.5)
WBC: 3.4 10*3/uL — ABNORMAL LOW (ref 4.0–10.5)
nRBC: 0 % (ref 0.0–0.2)

## 2022-12-21 LAB — BASIC METABOLIC PANEL
Anion gap: 11 (ref 5–15)
BUN: 68 mg/dL — ABNORMAL HIGH (ref 6–20)
CO2: 24 mmol/L (ref 22–32)
Calcium: 9.3 mg/dL (ref 8.9–10.3)
Chloride: 104 mmol/L (ref 98–111)
Creatinine, Ser: 8.82 mg/dL — ABNORMAL HIGH (ref 0.61–1.24)
GFR, Estimated: 7 mL/min — ABNORMAL LOW (ref >60)
Glucose, Bld: 85 mg/dL (ref 70–99)
Potassium: 4.1 mmol/L (ref 3.5–5.1)
Sodium: 139 mmol/L (ref 135–145)

## 2022-12-21 LAB — TROPONIN I (HIGH SENSITIVITY)
Troponin I (High Sensitivity): 30 ng/L — ABNORMAL HIGH (ref <18)
Troponin I (High Sensitivity): 29 ng/L — ABNORMAL HIGH (ref <18)

## 2022-12-21 SURGERY — A/V FISTULAGRAM
Anesthesia: LOCAL | Laterality: Left

## 2022-12-21 MED ORDER — MECLIZINE HCL 25 MG PO TABS: 25.0000 mg | ORAL_TABLET | Freq: Three times a day (TID) | ORAL | 0 refills | Status: AC | PRN

## 2022-12-21 MED ORDER — HYDROCODONE-ACETAMINOPHEN 5-325 MG PO TABS
1.0000 | ORAL_TABLET | Freq: Once | ORAL | Status: AC
  Administered 2022-12-21: 1 via ORAL
  Filled 2022-12-21: qty 1

## 2022-12-21 MED ORDER — CLOPIDOGREL BISULFATE 75 MG PO TABS: 75.0000 mg | ORAL_TABLET | Freq: Every day | ORAL | 0 refills | Status: DC

## 2022-12-21 MED ORDER — MECLIZINE HCL 25 MG PO TABS
25.0000 mg | ORAL_TABLET | Freq: Once | ORAL | Status: AC
  Administered 2022-12-21: 25 mg via ORAL
  Filled 2022-12-21: qty 1

## 2022-12-21 MED ORDER — HYDROCODONE-ACETAMINOPHEN 5-325 MG PO TABS
1.0000 | ORAL_TABLET | Freq: Four times a day (QID) | ORAL | 0 refills | Status: DC | PRN
Start: 2022-12-21 — End: 2023-01-23

## 2022-12-21 NOTE — ED Notes (Signed)
Patient transported to X-ray 

## 2022-12-21 NOTE — Discharge Instructions (Addendum)
Follow-up with your primary care doctor to arrange an outpatient echocardiogram that is the only thing that is left to be worked up for the concerns that neurology had.  Give Dr. Randie Heinz a call to follow-up in the vascular clinic.  Information provided above.  He still feels that you need this vascular procedure of your AV fistula to evaluate the bulging internal jugular vein.  Also make an appointment to follow-up with [facility] neurology outpatient.  And make sure you keep your follow-up for dialysis as scheduled for tomorrow.  Also neurology wanted you to take Plavix and aspirin together for 3 weeks.  And then just aspirin.  Also will give you hydrocodone for the rib pain.  And Antivert for the dizziness.  Also referral information provided to physical therapy for vestibular rehab.

## 2022-12-21 NOTE — ED Triage Notes (Signed)
Pt arrived reporting headache, dizziness, rib pain. States was admitted last night and left AMA due to not feeling help. Patient has extensive cardiac hx. Pt currently on dialysis. States he was suppose to have a procedure done today

## 2022-12-21 NOTE — ED Provider Notes (Addendum)
[place] EMERGENCY DEPARTMENT AT [facility] Provider Note   CSN: [id number] Arrival date & time: 12/21/22  0935     History  Chief Complaint  Patient presents with   Headache   Chest Pain    [name] is a 52 y.o. male.  Patient presenting with persistent headache and dizziness and the new complaint of some left anterior lower rib pain.  Patient was admitted on October 5.  Patient was transferred from [facility] with concerns for possible CVA based on his head CT.  Patient was admitted to the hospitalist service at [facility].  Patient is a dialysis patient.  He receives dialysis just once a week now.  Does make some urine.  They had recommended the admission due to the dizziness and his CT angio raising concerns ongoing stroke workup with brain MRI ordered and pending.  Patient ended up leaving AMA yesterday.  Patient was being treated with Antivert.  And patient today was supposed to have a evaluation by Dr. Pascal Lux vascular surgery for his AV fistula in the left arm.  Reviewing his admission notes the vertigo was for rule out CVA brain MRI was ordered brain MRI did not have any acute findings.  Which the new prior to him leaving AMA.  Is got a history of coronary artery disease status post CABG.  Sufficient vascular abnormality seen on CT angio head and neck severely distended left JVD.  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  Appears they were planning to evaluate his AV fistula as a procedure today.  Will contact them to see if anything has to be done or if it can be rearranged as an outpatient.  He is known to have hypertension.  Hyperlipidemia we mentioned end-stage renal disease on dialysis.  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  History of COPD tobacco use disorder.  Patient's main complaint has been the left anterior lower rib  pain.  Which she says it been cracking.  He said they always did that but for some reason in the hospital started to get severe pain in that area.  That was part of his complaint on why he needed to leave because they were not addressing that.       Home Medications Prior to Admission medications   Medication Sig Start Date End Date Taking? Authorizing Provider  acetaminophen (TYLENOL) 325 MG tablet Take 650 mg by mouth every 6 (six) hours as needed for moderate pain (for pain or headaches).    [provider]  albuterol (VENTOLIN HFA) 108 (90 Base) MCG/ACT inhaler Inhale 1 puff into the lungs every 6 (six) hours as needed for wheezing or shortness of breath. 05/24/21   [provider]  amitriptyline (ELAVIL) 50 MG tablet Take 50 mg by mouth at bedtime.    [provider]  amLODipine (NORVASC) 10 MG tablet TAKE 1 TABLET(10 MG) BY MOUTH DAILY 07/15/21   Laurey Morale, MD  aspirin EC 81 MG EC tablet Take 1 tablet (81 mg total) by mouth daily. 12/12/17   Duke, Roe Rutherford, PA  atorvastatin (LIPITOR) 80 MG tablet Take 80 mg by mouth at bedtime.    [provider]  carvedilol (COREG) 25 MG tablet Take 25 mg by mouth 2 (two) times daily with a meal.    [provider]  cloNIDine (CATAPRES) 0.1 MG tablet Take 1 tablet (0.1 mg  total) by mouth 2 (two) times daily. 10/08/22   Clegg, Amy D, NP  Evolocumab (REPATHA SURECLICK) 140 MG/ML SOAJ Inject 140 mg into the skin every 14 (fourteen) days. 10/28/22   Hilty, Lisette Abu, MD  hydrALAZINE (APRESOLINE) 100 MG tablet Take 1 tablet (100 mg total) by mouth 3 (three) times daily. 11/06/21   Laurey Morale, MD  HYDROcodone-acetaminophen (NORCO) 5-325 MG tablet Take 1-2 tablets by mouth every 6 (six) hours as needed. 03/02/22   Geoffery Lyons, MD  lidocaine-prilocaine (EMLA) cream Apply 1 application. topically as needed (prior to port being accessed). Use on dialysis days (Tues, Thurs and Sat)    [provider]   sildenafil (VIAGRA) 50 MG tablet Take 1 tablet (50 mg total) by mouth daily as needed for erectile dysfunction. 03/30/22   Laurey Morale, MD  Tiotropium Bromide-Olodaterol (STIOLTO RESPIMAT) 2.5-2.5 MCG/ACT AERS Inhale 2 puffs into the lungs daily. 02/28/21   Jacklynn Ganong, FNP      Allergies    Imdur [isosorbide nitrate] and Tramadol    Review of Systems   Review of Systems  Cardiovascular:  Positive for chest pain.  Neurological:  Positive for dizziness and headaches.    Physical Exam Updated Vital Signs BP (!) 168/94   Pulse 71   Temp 97.9 F (36.6 C) (Oral)   Resp 17   SpO2 100%  Physical Exam Vitals and nursing note reviewed.  Constitutional:      General: He is not in acute distress.    Appearance: Normal appearance. He is well-developed.  HENT:     Head: Normocephalic and atraumatic.  Eyes:     Extraocular Movements: Extraocular movements intact.     Conjunctiva/sclera: Conjunctivae normal.     Pupils: Pupils are equal, round, and reactive to light.  Neck:     Comments: Marked distention of the left jugular vein. Cardiovascular:     Rate and Rhythm: Normal rate and regular rhythm.     Heart sounds: No murmur heard. Pulmonary:     Effort: Pulmonary effort is normal. No respiratory distress.     Breath sounds: Normal breath sounds. No wheezing or rhonchi.     Comments: Tenderness to palpation to left lower anterior ribs.  No tenderness left upper quadrant part of the abdomen. Chest:     Chest wall: Tenderness present.  Abdominal:     Palpations: Abdomen is soft.     Tenderness: There is no abdominal tenderness.  Musculoskeletal:        General: No swelling.     Cervical back: Normal range of motion and neck supple. No rigidity.     Comments: AV fistula left upper arm with good thrill.  Does have some dilatation of his jugular vein that you can see in the left part of his neck.  Skin:    General: Skin is warm and dry.     Capillary Refill: Capillary  refill takes less than 2 seconds.  Neurological:     Mental Status: He is alert and oriented to person, place, and time.     Cranial Nerves: No cranial nerve deficit.     Sensory: No sensory deficit.     Motor: No weakness.     Comments: Vertigo symptoms.  Psychiatric:        Mood and Affect: Mood normal.     ED Results / Procedures / Treatments   Labs (all labs ordered are listed, but only abnormal results are displayed) Labs Reviewed  CBC WITH  DIFFERENTIAL/PLATELET - Abnormal; Notable for the following components:      Result Value   WBC 3.4 (*)    RBC 3.57 (*)    Hemoglobin 11.5 (*)    HCT 35.8 (*)    MCV 100.3 (*)    Platelets 80 (*)    All other components within normal limits  BASIC METABOLIC PANEL - Abnormal; Notable for the following components:   BUN 68 (*)    Creatinine, Ser 8.82 (*)    GFR, Estimated 7 (*)    All other components within normal limits  TROPONIN I (HIGH SENSITIVITY) - Abnormal; Notable for the following components:   Troponin I (High Sensitivity) 30 (*)    All other components within normal limits  TROPONIN I (HIGH SENSITIVITY)    EKG EKG Interpretation Date/Time:  Monday December 21 2022 09:47:44 EDT Ventricular Rate:  67 PR Interval:  168 QRS Duration:  107 QT Interval:  405 QTC Calculation: 428 R Axis:   28  Text Interpretation: Sinus rhythm LAE, consider biatrial enlargement LVH with secondary repolarization abnormality Minimal ST elevation, inferior leads No significant change since last tracing Confirmed by Vanetta Mulders [phone]) on 12/21/2022 10:35:24 AM  Radiology DG Ribs Unilateral W/Chest Left  Result Date: 12/21/2022 CLINICAL DATA:  Pain. EXAM: LEFT RIBS AND CHEST - 3+ VIEW COMPARISON:  Chest radiograph dated April 27, 2022. FINDINGS: No acute osseous abnormality is identified. No obvious displaced rib fracture. There is no evidence of pneumothorax or pleural effusion. Mild scarring at the left lung base. Both lungs are  otherwise clear. Heart size and mediastinal contours are unchanged with prior median sternotomy, aortic valve replacement, and thoracic aortic stent grafting. IMPRESSION: No displaced rib fracture identified. Electronically Signed   By: Hart Robinsons M.D.   On: 12/21/2022 13:23   MR BRAIN WO CONTRAST  Result Date: 12/20/2022 CLINICAL DATA:  Syncope EXAM: MRI HEAD WITHOUT CONTRAST TECHNIQUE: Multiplanar, multiecho pulse sequences of the brain and surrounding structures were obtained without intravenous contrast. COMPARISON:  None Available. FINDINGS: Brain: No acute infarct, mass effect or extra-axial collection. There is multifocal hyperintense T2-weighted signal within the white matter. Parenchymal volume and CSF spaces are normal. Old left parietal infarct. The midline structures are normal. Vascular: Vertebrobasilar dolichoectasia, unchanged. Flow voids are preserved. Skull and upper cervical spine: Normal calvarium and skull base. Visualized upper cervical spine and soft tissues are normal. Sinuses/Orbits:Trace left mastoid fluid. Mild bilateral maxillary sinus mucosal thickening. Normal orbits. IMPRESSION: 1. No acute intracranial abnormality. 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 3. Chronic small vessel ischemia. Electronically Signed   By: Deatra Robinson M.D.   On: 12/20/2022 02:02    Procedures Procedures    Medications Ordered in ED Medications  HYDROcodone-acetaminophen (NORCO/VICODIN) 5-325 MG per tablet 1 tablet (1 tablet Oral Given 12/21/22 1117)  meclizine (ANTIVERT) tablet 25 mg (25 mg Oral Given 12/21/22 1117)    ED Course/ Medical Decision Making/ A&P                                 Medical Decision Making Amount and/or Complexity of Data Reviewed Labs: ordered. Radiology: ordered.  Risk Prescription drug management.   Patient's workup here delta troponins pending.  First troponin was 30.  But does not too far off of baseline for him.  Basic metabolic panel GFR 4 potassium 4.1.   No significant electrolyte abnormalities.  Patient CBC white count 3.4 hemoglobin 11.5 platelets at 80.  Patient's x-ray of chest with left-sided ribs no displaced rib fracture identified no significant chest abnormalities.  Waiting for delta troponin.  Will contact on-call vascular.  To see about the procedure he missed today.  Vascular consult from yesterday where they were planning to address the extensive distention of his left jugular vein they did not think it was related to his symptoms of dizziness but they were planning on addressing and I think that may be what the AV fistulogram was for today.  Will discuss with on-call vascular surgery.  Patient's MRI that was done on October 5.  No acute intracranial abnormality old left parietal infarct and chronic small vessel ischemia.  Will discuss with on-call neurology just to make sure that patient does not require additional workup or readmission from their viewpoint.  Delta troponin is 29.  So no significant change.  Discussed with vascular surgery Dr. Randie Heinz.  They can do the procedure and schedule it as an outpatient procedure.  Patient does need to have it done.  Discussed with on-call neurology and neurology just reinforced that rest of workup can be done as an outpatient he just needs an echocardiogram.  They did recommend that he take aspirin and Plavix for 3 weeks and then just aspirin.  And I will treat him with Antivert and give him some pain medicine for the rib pain.   Final Clinical Impression(s) / ED Diagnoses Final diagnoses:  Vertigo  Chest wall pain    Rx / DC Orders ED Discharge Orders     None         Vanetta Mulders, MD 12/21/22 1428    Vanetta Mulders, MD 12/21/22 1428    Vanetta Mulders, MD 12/21/22 1432    Vanetta Mulders, MD 12/21/22 1505

## 2022-12-21 NOTE — ED Notes (Signed)
Pt given crackers and soda. 

## 2022-12-23 ENCOUNTER — Other Ambulatory Visit: Payer: Self-pay

## 2022-12-23 DIAGNOSIS — N186 End stage renal disease: Secondary | ICD-10-CM

## 2022-12-23 DIAGNOSIS — R221 Localized swelling, mass and lump, neck: Secondary | ICD-10-CM

## 2022-12-23 MED ORDER — SODIUM CHLORIDE 0.9 % IV SOLN: 250.0000 mL | INTRAVENOUS | Status: AC | PRN

## 2022-12-23 MED ORDER — SODIUM CHLORIDE 0.9% FLUSH: 3.0000 mL | Freq: Two times a day (BID) | INTRAVENOUS | Status: DC

## 2022-12-25 ENCOUNTER — Encounter (HOSPITAL_COMMUNITY)
Admission: RE | Disposition: A | Discharge status: Home / Self Care | Payer: Self-pay | Source: Ambulatory Visit | Attending: Vascular Surgery

## 2022-12-25 ENCOUNTER — Ambulatory Visit (HOSPITAL_COMMUNITY)
Admission: RE | Admit: 2022-12-25 | Discharge: 2022-12-25 | Disposition: A | Discharge status: Home / Self Care | Payer: 59 | Source: Ambulatory Visit | Attending: Vascular Surgery | Admitting: Vascular Surgery

## 2022-12-25 ENCOUNTER — Other Ambulatory Visit: Payer: Self-pay

## 2022-12-25 ENCOUNTER — Ambulatory Visit (HOSPITAL_COMMUNITY): Payer: 59

## 2022-12-25 DIAGNOSIS — Y832 Surgical operation with anastomosis, bypass or graft as the cause of abnormal reaction of the patient, or of later complication, without mention of misadventure at the time of the procedure: Secondary | ICD-10-CM | POA: Insufficient documentation

## 2022-12-25 DIAGNOSIS — N185 Chronic kidney disease, stage 5: Secondary | ICD-10-CM | POA: Diagnosis not present

## 2022-12-25 DIAGNOSIS — R221 Localized swelling, mass and lump, neck: Secondary | ICD-10-CM

## 2022-12-25 DIAGNOSIS — T82858A Stenosis of vascular prosthetic devices, implants and grafts, initial encounter: Secondary | ICD-10-CM | POA: Diagnosis present

## 2022-12-25 DIAGNOSIS — N186 End stage renal disease: Secondary | ICD-10-CM | POA: Diagnosis not present

## 2022-12-25 DIAGNOSIS — T82898A Other specified complication of vascular prosthetic devices, implants and grafts, initial encounter: Secondary | ICD-10-CM | POA: Diagnosis not present

## 2022-12-25 DIAGNOSIS — Z992 Dependence on renal dialysis: Secondary | ICD-10-CM | POA: Diagnosis not present

## 2022-12-25 HISTORY — PX: A/V FISTULAGRAM: CATH118298

## 2022-12-25 HISTORY — PX: PERIPHERAL VASCULAR INTERVENTION: CATH118257

## 2022-12-25 HISTORY — PX: PERIPHERAL VASCULAR ULTRASOUND/IVUS: CATH118334

## 2022-12-25 LAB — POCT I-STAT, CHEM 8
BUN: 43 mg/dL — ABNORMAL HIGH (ref 6–20)
Calcium, Ion: 1.26 mmol/L (ref 1.15–1.40)
Chloride: 102 mmol/L (ref 98–111)
Creatinine, Ser: 9.8 mg/dL — ABNORMAL HIGH (ref 0.61–1.24)
Glucose, Bld: 87 mg/dL (ref 70–99)
HCT: 27 % — ABNORMAL LOW (ref 39.0–52.0)
Hemoglobin: 9.2 g/dL — ABNORMAL LOW (ref 13.0–17.0)
Potassium: 3.9 mmol/L (ref 3.5–5.1)
Sodium: 140 mmol/L (ref 135–145)
TCO2: 25 mmol/L (ref 22–32)

## 2022-12-25 SURGERY — A/V FISTULAGRAM
Anesthesia: LOCAL | Laterality: Left

## 2022-12-25 MED ORDER — IODIXANOL 320 MG/ML IV SOLN
INTRAVENOUS | Status: DC | PRN
  Administered 2022-12-25: 40 mL

## 2022-12-25 MED ORDER — OXYCODONE-ACETAMINOPHEN 5-325 MG PO TABS
1.0000 | ORAL_TABLET | ORAL | 0 refills | Status: DC | PRN
Start: 2022-12-25 — End: 2023-04-19

## 2022-12-25 MED ORDER — FENTANYL CITRATE (PF) 100 MCG/2ML IJ SOLN
INTRAMUSCULAR | Status: DC | PRN
  Administered 2022-12-25 (×2): 50 ug via INTRAVENOUS

## 2022-12-25 MED ORDER — MIDAZOLAM HCL 2 MG/2ML IJ SOLN
INTRAMUSCULAR | Status: DC | PRN
  Administered 2022-12-25: 1 mg via INTRAVENOUS

## 2022-12-25 MED ORDER — LIDOCAINE HCL (PF) 1 % IJ SOLN
INTRAMUSCULAR | Status: AC
  Filled 2022-12-25: qty 30

## 2022-12-25 MED ORDER — OXYCODONE-ACETAMINOPHEN 5-325 MG PO TABS
1.0000 | ORAL_TABLET | Freq: Once | ORAL | Status: AC
  Administered 2022-12-25: 1 via ORAL
  Filled 2022-12-25: qty 2

## 2022-12-25 MED ORDER — HEPARIN (PORCINE) IN NACL 1000-0.9 UT/500ML-% IV SOLN
INTRAVENOUS | Status: DC | PRN
  Administered 2022-12-25: 500 mL

## 2022-12-25 MED ORDER — OXYCODONE-ACETAMINOPHEN 5-325 MG PO TABS
1.0000 | ORAL_TABLET | Freq: Once | ORAL | Status: AC
  Administered 2022-12-25: 1 via ORAL
  Filled 2022-12-25: qty 1

## 2022-12-25 MED ORDER — FENTANYL CITRATE (PF) 100 MCG/2ML IJ SOLN
INTRAMUSCULAR | Status: AC
  Filled 2022-12-25: qty 2

## 2022-12-25 MED ORDER — LIDOCAINE HCL (PF) 1 % IJ SOLN
INTRAMUSCULAR | Status: DC | PRN
  Administered 2022-12-25: 15 mL

## 2022-12-25 MED ORDER — MIDAZOLAM HCL 2 MG/2ML IJ SOLN
INTRAMUSCULAR | Status: AC
  Filled 2022-12-25: qty 2

## 2022-12-25 MED ORDER — SODIUM CHLORIDE 0.9% FLUSH: 3.0000 mL | INTRAVENOUS | Status: DC | PRN

## 2022-12-25 SURGICAL SUPPLY — 17 items
BALLN ATLAS 12X40X75 (BALLOONS) ×1
BALLOON ATLAS 12X40X75 (BALLOONS) IMPLANT
CATH ANGIO 5F BER2 100CM (CATHETERS) IMPLANT
CATH ANGIO 5F BER2 65CM (CATHETERS) IMPLANT
CATH QUICKCROSS .035X135CM (MICROCATHETER) IMPLANT
CATH VISIONS PV .035 IVUS (CATHETERS) IMPLANT
COVER DOME SNAP 22 D (MISCELLANEOUS) ×2 IMPLANT
GLIDEWIRE ADV .035X260CM (WIRE) IMPLANT
KIT ENCORE 26 ADVANTAGE (KITS) IMPLANT
KIT MICROPUNCTURE NIT STIFF (SHEATH) IMPLANT
SHEATH PINNACLE 8F 10CM (SHEATH) IMPLANT
SHEATH PINNACLE 9F 10CM (SHEATH) IMPLANT
SHEATH PROBE COVER 6X72 (BAG) ×2 IMPLANT
STENT VENOUS ABRE 16X80 (Permanent Stent) IMPLANT
TRAY PV CATH (CUSTOM PROCEDURE TRAY) ×2 IMPLANT
TUBING CIL FLEX 10 FLL-RA (TUBING) ×2 IMPLANT
WIRE ROSEN-J .035X260CM (WIRE) IMPLANT

## 2022-12-25 NOTE — Progress Notes (Signed)
Re-evaluated patient after stenting. He has dull ache in his chest radiating to his back and L arm. He was sleeping prior to my arrival. CXR is unremarkable - no evidence of mediastinal mass. Stent in good position. EKG is unremarkable - no clear evidence of ST changes suggestive of ACS. Suspect his pain is from stent and recanalization of occluded brachiocephalic vein. Will Rx percocet. Instructed to return to care if his pain worsens.  Rande Brunt. Lenell Antu, MD [facility] Phone Number: [phone] 12/25/2022 12:18 PM

## 2022-12-25 NOTE — Op Note (Signed)
DATE OF SERVICE: 12/25/2022  PATIENT:  [name]  52 y.o. male  PRE-OPERATIVE DIAGNOSIS:  ESRD; central venous stenosis  POST-OPERATIVE DIAGNOSIS:  Same  PROCEDURE:   Ultrasound guided left AVF access Left arm fistulagram Intravascular ultrasound of the superior vena cava, left brachiocephalic vein, left subclavian vein, left axillary vein Left brachiocephalic arteriovenous angioplasty and stenting (16x74mm Abre) Conscious sedation (53 minutes)  SURGEON:  Surgeons and Role:    * Leonie Douglas, MD - Primary  ASSISTANT: none  ANESTHESIA:   local and IV sedation  EBL: minimal  BLOOD ADMINISTERED:none  DRAINS: none   LOCAL MEDICATIONS USED:  NONE  SPECIMEN:  none  COUNTS: confirmed correct.  TOURNIQUET:  none  PATIENT DISPOSITION:  PACU - hemodynamically stable.   Delay start of Pharmacological VTE agent (>24hrs) due to surgical blood loss or risk of bleeding: no  INDICATION FOR PROCEDURE: DALLIS CZAJA is a 52 y.o. male with ESRD and central venous stenosis. After careful discussion of risks, benefits, and alternatives the patient was offered fistulagram. The patient understood and wished to proceed.  OPERATIVE FINDINGS: occluded left brachiocephalic vein. Successful recanalization of left brachiocephalic vein with angioplasty and stenting. Smooth thrill in fistula at completion.  DESCRIPTION OF PROCEDURE: After identification of the patient in the pre-operative holding area, the patient was transferred to the operating room. The patient was positioned supine on the operating room table. Anesthesia was induced. The left arm was prepped and draped in standard fashion. A surgical pause was performed confirming correct patient, procedure, and operative location.  Duplex ultrasound was used to access the left arm brachiobasilic arteriovenous fistula.  Fistulogram was performed centrally.  This confirmed brachiocephalic vein occlusion.  I am suspicious that compression of  the aorto brachiocephalic ranch bypass was causing compression.  I was able to traverse the occlusion fairly easily with a Glidewire advantage.  Intravascular ultrasound was performed of the superior vena cava, left brachiocephalic vein, left subclavian vein, and left axillary vein.  A 16 x 80 mm Opry stent was brought on the field and delivered across the lesion.  This was deployed with overlap into normal vein proximally distally.  Angioplasty with a 12 x 40 mm Atlas balloon was then performed.  Follow-up fistulogram showed resolution of occlusion and much improved flow through the chest.  All endovascular equipment was removed.  A pursestring was placed around the access and rendered hemostatic.  Upon completion of the case instrument and sharps counts were confirmed correct. The patient was transferred to the PACU in good condition. I was present for all portions of the procedure.  FOLLOW UP PLAN: Assuming a normal postoperative course, I will see the patient in 2-3 weeks with AV fistula duplex.   Rande Brunt. Lenell Antu, MD [facility] Phone Number: [phone] 12/25/2022 9:11 AM

## 2022-12-25 NOTE — Progress Notes (Signed)
On arrival to short stay, patient had 2/10 pain in the upper L chest area (brachiocephalic vein stent placed). Patient later stated it was an 8/10. MD paged, new pain med orders given. VSS, procedure site level 0, arm is warm, L radial pulse +1, neck distension noticed but patient states that's been present. Now patient states 10/10 aching pain in L neck, down L shoulder, into L arm, and L hand feels cool and numb to him. Dr. Lenell Antu paged and new orders for EKG and chest xray given.

## 2022-12-28 ENCOUNTER — Encounter (HOSPITAL_COMMUNITY): Payer: Self-pay | Admitting: Vascular Surgery

## 2023-01-09 ENCOUNTER — Encounter (HOSPITAL_COMMUNITY): Payer: Self-pay | Admitting: Emergency Medicine

## 2023-01-09 ENCOUNTER — Inpatient Hospital Stay (HOSPITAL_COMMUNITY)
Admission: EM | Admit: 2023-01-09 | Discharge: 2023-01-14 | DRG: 871 | Disposition: A | Discharge status: Home / Self Care | Payer: 59 | Attending: Internal Medicine | Admitting: Internal Medicine

## 2023-01-09 ENCOUNTER — Other Ambulatory Visit: Payer: Self-pay

## 2023-01-09 ENCOUNTER — Emergency Department (HOSPITAL_COMMUNITY): Payer: 59

## 2023-01-09 DIAGNOSIS — A411 Sepsis due to other specified staphylococcus: Secondary | ICD-10-CM | POA: Diagnosis present

## 2023-01-09 DIAGNOSIS — R7989 Other specified abnormal findings of blood chemistry: Secondary | ICD-10-CM | POA: Diagnosis not present

## 2023-01-09 DIAGNOSIS — I132 Hypertensive heart and chronic kidney disease with heart failure and with stage 5 chronic kidney disease, or end stage renal disease: Secondary | ICD-10-CM | POA: Diagnosis present

## 2023-01-09 DIAGNOSIS — R634 Abnormal weight loss: Secondary | ICD-10-CM | POA: Diagnosis not present

## 2023-01-09 DIAGNOSIS — Z8349 Family history of other endocrine, nutritional and metabolic diseases: Secondary | ICD-10-CM

## 2023-01-09 DIAGNOSIS — I252 Old myocardial infarction: Secondary | ICD-10-CM | POA: Diagnosis not present

## 2023-01-09 DIAGNOSIS — D696 Thrombocytopenia, unspecified: Secondary | ICD-10-CM | POA: Diagnosis present

## 2023-01-09 DIAGNOSIS — I5042 Chronic combined systolic (congestive) and diastolic (congestive) heart failure: Secondary | ICD-10-CM | POA: Diagnosis present

## 2023-01-09 DIAGNOSIS — Z8249 Family history of ischemic heart disease and other diseases of the circulatory system: Secondary | ICD-10-CM

## 2023-01-09 DIAGNOSIS — F32A Depression, unspecified: Secondary | ICD-10-CM | POA: Diagnosis present

## 2023-01-09 DIAGNOSIS — Z95828 Presence of other vascular implants and grafts: Secondary | ICD-10-CM

## 2023-01-09 DIAGNOSIS — Z8673 Personal history of transient ischemic attack (TIA), and cerebral infarction without residual deficits: Secondary | ICD-10-CM

## 2023-01-09 DIAGNOSIS — N2581 Secondary hyperparathyroidism of renal origin: Secondary | ICD-10-CM | POA: Diagnosis present

## 2023-01-09 DIAGNOSIS — I25119 Atherosclerotic heart disease of native coronary artery with unspecified angina pectoris: Secondary | ICD-10-CM | POA: Diagnosis not present

## 2023-01-09 DIAGNOSIS — Z955 Presence of coronary angioplasty implant and graft: Secondary | ICD-10-CM

## 2023-01-09 DIAGNOSIS — R509 Fever, unspecified: Secondary | ICD-10-CM | POA: Diagnosis not present

## 2023-01-09 DIAGNOSIS — D693 Immune thrombocytopenic purpura: Secondary | ICD-10-CM | POA: Diagnosis present

## 2023-01-09 DIAGNOSIS — Z79899 Other long term (current) drug therapy: Secondary | ICD-10-CM

## 2023-01-09 DIAGNOSIS — J449 Chronic obstructive pulmonary disease, unspecified: Secondary | ICD-10-CM | POA: Diagnosis present

## 2023-01-09 DIAGNOSIS — Z681 Body mass index (BMI) 19 or less, adult: Secondary | ICD-10-CM

## 2023-01-09 DIAGNOSIS — I428 Other cardiomyopathies: Secondary | ICD-10-CM | POA: Diagnosis present

## 2023-01-09 DIAGNOSIS — R6510 Systemic inflammatory response syndrome (SIRS) of non-infectious origin without acute organ dysfunction: Secondary | ICD-10-CM

## 2023-01-09 DIAGNOSIS — J209 Acute bronchitis, unspecified: Secondary | ICD-10-CM

## 2023-01-09 DIAGNOSIS — R079 Chest pain, unspecified: Secondary | ICD-10-CM | POA: Diagnosis not present

## 2023-01-09 DIAGNOSIS — Z823 Family history of stroke: Secondary | ICD-10-CM

## 2023-01-09 DIAGNOSIS — K219 Gastro-esophageal reflux disease without esophagitis: Secondary | ICD-10-CM | POA: Diagnosis present

## 2023-01-09 DIAGNOSIS — F419 Anxiety disorder, unspecified: Secondary | ICD-10-CM | POA: Diagnosis present

## 2023-01-09 DIAGNOSIS — I10 Essential (primary) hypertension: Secondary | ICD-10-CM | POA: Diagnosis not present

## 2023-01-09 DIAGNOSIS — N186 End stage renal disease: Secondary | ICD-10-CM

## 2023-01-09 DIAGNOSIS — E43 Unspecified severe protein-calorie malnutrition: Secondary | ICD-10-CM | POA: Insufficient documentation

## 2023-01-09 DIAGNOSIS — Z992 Dependence on renal dialysis: Secondary | ICD-10-CM

## 2023-01-09 DIAGNOSIS — Y831 Surgical operation with implant of artificial internal device as the cause of abnormal reaction of the patient, or of later complication, without mention of misadventure at the time of the procedure: Secondary | ICD-10-CM | POA: Diagnosis present

## 2023-01-09 DIAGNOSIS — I340 Nonrheumatic mitral (valve) insufficiency: Secondary | ICD-10-CM | POA: Diagnosis not present

## 2023-01-09 DIAGNOSIS — T82857A Stenosis of cardiac prosthetic devices, implants and grafts, initial encounter: Secondary | ICD-10-CM | POA: Diagnosis present

## 2023-01-09 DIAGNOSIS — F17210 Nicotine dependence, cigarettes, uncomplicated: Secondary | ICD-10-CM | POA: Diagnosis present

## 2023-01-09 DIAGNOSIS — Z9861 Coronary angioplasty status: Secondary | ICD-10-CM

## 2023-01-09 DIAGNOSIS — Z7902 Long term (current) use of antithrombotics/antiplatelets: Secondary | ICD-10-CM

## 2023-01-09 DIAGNOSIS — Z1152 Encounter for screening for COVID-19: Secondary | ICD-10-CM | POA: Diagnosis not present

## 2023-01-09 DIAGNOSIS — I080 Rheumatic disorders of both mitral and aortic valves: Secondary | ICD-10-CM | POA: Diagnosis present

## 2023-01-09 DIAGNOSIS — I2510 Atherosclerotic heart disease of native coronary artery without angina pectoris: Secondary | ICD-10-CM | POA: Diagnosis present

## 2023-01-09 DIAGNOSIS — Z888 Allergy status to other drugs, medicaments and biological substances status: Secondary | ICD-10-CM

## 2023-01-09 DIAGNOSIS — E782 Mixed hyperlipidemia: Secondary | ICD-10-CM | POA: Diagnosis present

## 2023-01-09 DIAGNOSIS — Z720 Tobacco use: Secondary | ICD-10-CM | POA: Diagnosis present

## 2023-01-09 DIAGNOSIS — R0781 Pleurodynia: Secondary | ICD-10-CM

## 2023-01-09 DIAGNOSIS — Z951 Presence of aortocoronary bypass graft: Secondary | ICD-10-CM

## 2023-01-09 DIAGNOSIS — I5032 Chronic diastolic (congestive) heart failure: Secondary | ICD-10-CM | POA: Diagnosis not present

## 2023-01-09 DIAGNOSIS — M898X9 Other specified disorders of bone, unspecified site: Secondary | ICD-10-CM | POA: Diagnosis present

## 2023-01-09 DIAGNOSIS — Z953 Presence of xenogenic heart valve: Secondary | ICD-10-CM

## 2023-01-09 DIAGNOSIS — Z7982 Long term (current) use of aspirin: Secondary | ICD-10-CM

## 2023-01-09 DIAGNOSIS — Z833 Family history of diabetes mellitus: Secondary | ICD-10-CM

## 2023-01-09 DIAGNOSIS — I359 Nonrheumatic aortic valve disorder, unspecified: Secondary | ICD-10-CM | POA: Diagnosis present

## 2023-01-09 DIAGNOSIS — Z885 Allergy status to narcotic agent status: Secondary | ICD-10-CM

## 2023-01-09 DIAGNOSIS — J440 Chronic obstructive pulmonary disease with acute lower respiratory infection: Secondary | ICD-10-CM | POA: Diagnosis present

## 2023-01-09 DIAGNOSIS — R7881 Bacteremia: Secondary | ICD-10-CM | POA: Diagnosis not present

## 2023-01-09 DIAGNOSIS — Z95818 Presence of other cardiac implants and grafts: Secondary | ICD-10-CM

## 2023-01-09 DIAGNOSIS — Z801 Family history of malignant neoplasm of trachea, bronchus and lung: Secondary | ICD-10-CM

## 2023-01-09 DIAGNOSIS — I5031 Acute diastolic (congestive) heart failure: Secondary | ICD-10-CM | POA: Diagnosis not present

## 2023-01-09 DIAGNOSIS — Z800 Family history of malignant neoplasm of digestive organs: Secondary | ICD-10-CM

## 2023-01-09 HISTORY — DX: Systemic inflammatory response syndrome (sirs) of non-infectious origin without acute organ dysfunction: R65.10

## 2023-01-09 HISTORY — DX: Acute bronchitis, unspecified: J20.9

## 2023-01-09 LAB — CBC WITH DIFFERENTIAL/PLATELET
Abs Immature Granulocytes: 0.04 10*3/uL (ref 0.00–0.07)
Basophils Absolute: 0 10*3/uL (ref 0.0–0.1)
Basophils Relative: 0 %
Eosinophils Absolute: 0 10*3/uL (ref 0.0–0.5)
Eosinophils Relative: 0 %
HCT: 40.7 % (ref 39.0–52.0)
Hemoglobin: 13.4 g/dL (ref 13.0–17.0)
Immature Granulocytes: 1 %
Lymphocytes Relative: 7 %
Lymphs Abs: 0.5 10*3/uL — ABNORMAL LOW (ref 0.7–4.0)
MCH: 32.1 pg (ref 26.0–34.0)
MCHC: 32.9 g/dL (ref 30.0–36.0)
MCV: 97.6 fL (ref 80.0–100.0)
Monocytes Absolute: 0.4 10*3/uL (ref 0.1–1.0)
Monocytes Relative: 6 %
Neutro Abs: 5.7 10*3/uL (ref 1.7–7.7)
Neutrophils Relative %: 86 %
Platelets: 45 10*3/uL — ABNORMAL LOW (ref 150–400)
RBC: 4.17 MIL/uL — ABNORMAL LOW (ref 4.22–5.81)
RDW: 13.6 % (ref 11.5–15.5)
WBC: 6.6 10*3/uL (ref 4.0–10.5)
nRBC: 0 % (ref 0.0–0.2)

## 2023-01-09 LAB — COMPREHENSIVE METABOLIC PANEL
ALT: 23 U/L (ref 0–44)
AST: 39 U/L (ref 15–41)
Albumin: 4.2 g/dL (ref 3.5–5.0)
Alkaline Phosphatase: 49 U/L (ref 38–126)
Anion gap: 14 (ref 5–15)
BUN: 73 mg/dL — ABNORMAL HIGH (ref 6–20)
CO2: 22 mmol/L (ref 22–32)
Calcium: 9.9 mg/dL (ref 8.9–10.3)
Chloride: 96 mmol/L — ABNORMAL LOW (ref 98–111)
Creatinine, Ser: 10.07 mg/dL — ABNORMAL HIGH (ref 0.61–1.24)
GFR, Estimated: 6 mL/min — ABNORMAL LOW (ref >60)
Glucose, Bld: 98 mg/dL (ref 70–99)
Potassium: 3.7 mmol/L (ref 3.5–5.1)
Sodium: 132 mmol/L — ABNORMAL LOW (ref 135–145)
Total Bilirubin: 1.2 mg/dL (ref 0.3–1.2)
Total Protein: 8.1 g/dL (ref 6.5–8.1)

## 2023-01-09 LAB — TROPONIN I (HIGH SENSITIVITY)
Troponin I (High Sensitivity): 464 ng/L (ref <18)
Troponin I (High Sensitivity): 395 ng/L (ref <18)
Troponin I (High Sensitivity): 431 ng/L (ref <18)

## 2023-01-09 LAB — PROTIME-INR
INR: 1.3 — ABNORMAL HIGH (ref 0.8–1.2)
Prothrombin Time: 15.9 s — ABNORMAL HIGH (ref 11.4–15.2)

## 2023-01-09 LAB — RESP PANEL BY RT-PCR (RSV, FLU A&B, COVID)  RVPGX2
Influenza A by PCR: NEGATIVE
Influenza B by PCR: NEGATIVE
Resp Syncytial Virus by PCR: NEGATIVE
SARS Coronavirus 2 by RT PCR: NEGATIVE

## 2023-01-09 LAB — SEDIMENTATION RATE: Sed Rate: 3 mm/h (ref 0–16)

## 2023-01-09 LAB — LACTIC ACID, PLASMA: Lactic Acid, Venous: 1.5 mmol/L (ref 0.5–1.9)

## 2023-01-09 LAB — BRAIN NATRIURETIC PEPTIDE: B Natriuretic Peptide: 1044.9 pg/mL — ABNORMAL HIGH (ref 0.0–100.0)

## 2023-01-09 MED ORDER — DIPHENHYDRAMINE HCL 50 MG/ML IJ SOLN
12.5000 mg | Freq: Once | INTRAMUSCULAR | Status: AC
  Administered 2023-01-09: 12.5 mg via INTRAVENOUS
  Filled 2023-01-09: qty 1

## 2023-01-09 MED ORDER — NITROGLYCERIN 2 % TD OINT: 1.0000 [in_us] | TOPICAL_OINTMENT | Freq: Once | TRANSDERMAL | Status: DC

## 2023-01-09 MED ORDER — SODIUM CHLORIDE 0.9 % IV SOLN
2.0000 g | Freq: Once | INTRAVENOUS | Status: DC
  Filled 2023-01-09: qty 12.5

## 2023-01-09 MED ORDER — ONDANSETRON HCL 4 MG PO TABS: 4.0000 mg | ORAL_TABLET | Freq: Four times a day (QID) | ORAL | Status: DC | PRN

## 2023-01-09 MED ORDER — FENTANYL CITRATE PF 50 MCG/ML IJ SOSY
50.0000 ug | PREFILLED_SYRINGE | Freq: Once | INTRAMUSCULAR | Status: AC
  Administered 2023-01-09: 50 ug via INTRAVENOUS
  Filled 2023-01-09: qty 1

## 2023-01-09 MED ORDER — VANCOMYCIN HCL IN DEXTROSE 1-5 GM/200ML-% IV SOLN
1000.0000 mg | Freq: Once | INTRAVENOUS | Status: AC
Start: 2023-01-09 — End: 2023-01-09
  Administered 2023-01-09: 1000 mg via INTRAVENOUS
  Filled 2023-01-09: qty 200

## 2023-01-09 MED ORDER — IPRATROPIUM-ALBUTEROL 0.5-2.5 (3) MG/3ML IN SOLN: 3.0000 mL | RESPIRATORY_TRACT | Status: DC | PRN

## 2023-01-09 MED ORDER — ONDANSETRON HCL 4 MG/2ML IJ SOLN
4.0000 mg | Freq: Four times a day (QID) | INTRAMUSCULAR | Status: DC | PRN
  Administered 2023-01-12: 4 mg via INTRAVENOUS
  Filled 2023-01-09: qty 2

## 2023-01-09 MED ORDER — DM-GUAIFENESIN ER 30-600 MG PO TB12
1.0000 | ORAL_TABLET | Freq: Two times a day (BID) | ORAL | Status: DC
  Administered 2023-01-09 – 2023-01-14 (×9): 1 via ORAL
  Filled 2023-01-09 (×10): qty 1

## 2023-01-09 MED ORDER — HYDROMORPHONE HCL 1 MG/ML IJ SOLN: 0.5000 mg | Freq: Once | INTRAMUSCULAR | Status: DC | PRN

## 2023-01-09 MED ORDER — METOCLOPRAMIDE HCL 5 MG/ML IJ SOLN
10.0000 mg | Freq: Once | INTRAMUSCULAR | Status: AC
  Administered 2023-01-09: 10 mg via INTRAVENOUS
  Filled 2023-01-09: qty 2

## 2023-01-09 MED ORDER — VANCOMYCIN HCL 500 MG/100ML IV SOLN: 500.0000 mg | Freq: Once | INTRAVENOUS | Status: DC

## 2023-01-09 MED ORDER — SODIUM CHLORIDE 0.9 % IV SOLN
1.0000 g | Freq: Once | INTRAVENOUS | Status: AC
  Administered 2023-01-09: 1 g via INTRAVENOUS
  Filled 2023-01-09: qty 10

## 2023-01-09 MED ORDER — ENSURE ENLIVE PO LIQD
237.0000 mL | Freq: Two times a day (BID) | ORAL | Status: DC
  Administered 2023-01-10 – 2023-01-14 (×8): 237 mL via ORAL

## 2023-01-09 MED ORDER — ACETAMINOPHEN 500 MG PO TABS
1000.0000 mg | ORAL_TABLET | Freq: Once | ORAL | Status: AC
  Administered 2023-01-09: 1000 mg via ORAL
  Filled 2023-01-09: qty 2

## 2023-01-09 MED ORDER — METHYLPREDNISOLONE SODIUM SUCC 40 MG IJ SOLR
40.0000 mg | Freq: Two times a day (BID) | INTRAMUSCULAR | Status: DC
  Administered 2023-01-10 – 2023-01-12 (×5): 40 mg via INTRAVENOUS
  Filled 2023-01-09 (×5): qty 1

## 2023-01-09 MED ORDER — METRONIDAZOLE 500 MG/100ML IV SOLN
500.0000 mg | Freq: Once | INTRAVENOUS | Status: AC
Start: 2023-01-09 — End: 2023-01-09
  Administered 2023-01-09: 500 mg via INTRAVENOUS
  Filled 2023-01-09: qty 100

## 2023-01-09 MED ORDER — ACETAMINOPHEN 325 MG PO TABS
650.0000 mg | ORAL_TABLET | Freq: Four times a day (QID) | ORAL | Status: DC | PRN
  Administered 2023-01-10 – 2023-01-14 (×4): 650 mg via ORAL
  Filled 2023-01-09 (×5): qty 2

## 2023-01-09 MED ORDER — SODIUM CHLORIDE 0.9% FLUSH
10.0000 mL | Freq: Two times a day (BID) | INTRAVENOUS | Status: DC
  Administered 2023-01-09 – 2023-01-14 (×11): 10 mL via INTRAVENOUS

## 2023-01-09 MED ORDER — ASPIRIN 81 MG PO CHEW
324.0000 mg | CHEWABLE_TABLET | Freq: Once | ORAL | Status: AC
  Administered 2023-01-09: 324 mg via ORAL
  Filled 2023-01-09: qty 4

## 2023-01-09 MED ORDER — NITROGLYCERIN 0.4 MG SL SUBL
0.4000 mg | SUBLINGUAL_TABLET | Freq: Once | SUBLINGUAL | Status: AC
  Administered 2023-01-09: 0.4 mg via SUBLINGUAL
  Filled 2023-01-09: qty 1

## 2023-01-09 MED ORDER — SODIUM CHLORIDE 0.9 % IV SOLN
1.0000 g | Freq: Every day | INTRAVENOUS | Status: DC
  Filled 2023-01-09: qty 10

## 2023-01-09 MED ORDER — LACTATED RINGERS IV BOLUS (SEPSIS)
500.0000 mL | Freq: Once | INTRAVENOUS | Status: AC
Start: 2023-01-09 — End: 2023-01-09
  Administered 2023-01-09: 500 mL via INTRAVENOUS

## 2023-01-09 NOTE — ED Provider Notes (Addendum)
[place] EMERGENCY DEPARTMENT AT [facility] Provider Note   CSN: [id number] Arrival date & time: 01/09/23  1320     History  Chief Complaint  Patient presents with   Fever   Chest Pain   Headache    [name] is a 52 y.o. male.   Fever Associated symptoms: chest pain, headaches, nausea and vomiting   Chest Pain Associated symptoms: fatigue, fever, headache, nausea, vomiting and weakness (Generalized)   Headache Associated symptoms: fatigue, fever, nausea, neck pain, vomiting and weakness (Generalized)   Patient presents for multiple complaints.  Medical history includes HLD, HTN, aortic dissection, aortic valve replacement, CAD, inguinal hernia, anxiety, depression, alcohol abuse, ESRD, COPD, CHF, GERD, CVA.  He was seen in the ED 3 weeks ago for headache and dizziness.  The symptoms have persisted since that time.  In addition, he has had subjective fevers, postprandial nausea and vomiting with subsequent decreased p.o. intake, unintentional weight loss, chest pain radiating into left side of neck.  2 weeks ago, he underwent left brachiocephalic AV angioplasty and stenting with Dr. Lenell Antu.  This has not improved his symptoms.  He continues to get dialysis in his left arm fistula.  Typically, he gets dialysis Tuesday, Thursday, Saturday.  Last dialysis session was on Tuesday (4 days ago).  He has been on dialysis for the past 3 years.  He does continue to make urine.  He has not taken any Tylenol today.     Home Medications Prior to Admission medications   Medication Sig Start Date End Date Taking? Authorizing Provider  acetaminophen (TYLENOL) 325 MG tablet Take 650 mg by mouth every 6 (six) hours as needed for moderate pain (for pain or headaches).    [provider]  albuterol (VENTOLIN HFA) 108 (90 Base) MCG/ACT inhaler Inhale 1 puff into the lungs every 6 (six) hours as needed for wheezing or shortness of breath. 05/24/21   [provider]   amLODipine (NORVASC) 10 MG tablet TAKE 1 TABLET(10 MG) BY MOUTH DAILY 07/15/21   Laurey Morale, MD  aspirin EC 81 MG EC tablet Take 1 tablet (81 mg total) by mouth daily. 12/12/17   [facility], Roe Rutherford, PA  cloNIDine (CATAPRES) 0.1 MG tablet Take 1 tablet (0.1 mg total) by mouth 2 (two) times daily. 10/08/22   Clegg, Amy D, NP  clopidogrel (PLAVIX) 75 MG tablet Take 1 tablet (75 mg total) by mouth daily. 12/21/22   Vanetta Mulders, MD  Evolocumab (REPATHA SURECLICK) 140 MG/ML SOAJ Inject 140 mg into the skin every 14 (fourteen) days. Patient not taking: Reported on 12/24/2022 10/28/22   Chrystie Nose, MD  hydrALAZINE (APRESOLINE) 100 MG tablet Take 1 tablet (100 mg total) by mouth 3 (three) times daily. Patient taking differently: Take 50 mg by mouth 3 (three) times daily. 11/06/21   Laurey Morale, MD  HYDROcodone-acetaminophen (NORCO/VICODIN) 5-325 MG tablet Take 1 tablet by mouth every 6 (six) hours as needed for moderate pain. 12/21/22   Vanetta Mulders, MD  lidocaine-prilocaine (EMLA) cream Apply 1 application. topically as needed (prior to port being accessed). Use on dialysis days (Tues, Thurs and Sat)    [provider]  meclizine (ANTIVERT) 25 MG tablet Take 1 tablet (25 mg total) by mouth 3 (three) times daily as needed for dizziness. 12/21/22   Vanetta Mulders, MD  oxyCODONE-acetaminophen (PERCOCET) 5-325 MG tablet Take 1 tablet by mouth every 4 (four) hours as needed for severe pain. 12/25/22 12/25/23  Leonie Douglas,  MD  sildenafil (VIAGRA) 50 MG tablet Take 1 tablet (50 mg total) by mouth daily as needed for erectile dysfunction. Patient not taking: Reported on 12/24/2022 03/30/22   Laurey Morale, MD      Allergies    Imdur [isosorbide nitrate] and Tramadol    Review of Systems   Review of Systems  Constitutional:  Positive for activity change, appetite change, fatigue, fever and unexpected weight change.  Cardiovascular:  Positive for chest pain.   Gastrointestinal:  Positive for nausea and vomiting.  Musculoskeletal:  Positive for neck pain.  Neurological:  Positive for weakness (Generalized) and headaches.    Physical Exam Updated Vital Signs BP (!) 145/78   Pulse 94   Temp (!) 101.2 F (38.4 C) (Oral) Comment: MD made aware  Resp (!) 21   SpO2 98%  Physical Exam Vitals and nursing note reviewed.  Constitutional:      General: He is not in acute distress.    Appearance: He is well-developed. He is not ill-appearing, toxic-appearing or diaphoretic.  HENT:     Head: Normocephalic and atraumatic.  Eyes:     Conjunctiva/sclera: Conjunctivae normal.  Cardiovascular:     Rate and Rhythm: Regular rhythm. Tachycardia present.     Heart sounds: Murmur (Prominent) heard.  Pulmonary:     Effort: Pulmonary effort is normal. No tachypnea or respiratory distress.     Breath sounds: Normal breath sounds. No decreased breath sounds, wheezing, rhonchi or rales.  Chest:     Chest wall: No tenderness.  Abdominal:     Palpations: Abdomen is soft.     Tenderness: There is no abdominal tenderness.  Musculoskeletal:        General: No swelling. Normal range of motion.     Cervical back: Normal range of motion and neck supple.     Right lower leg: No edema.     Left lower leg: No edema.  Skin:    General: Skin is warm and dry.     Coloration: Skin is not cyanotic or pale.  Neurological:     General: No focal deficit present.     Mental Status: He is alert and oriented to person, place, and time.  Psychiatric:        Mood and Affect: Mood normal.        Behavior: Behavior normal.     ED Results / Procedures / Treatments   Labs (all labs ordered are listed, but only abnormal results are displayed) Labs Reviewed  COMPREHENSIVE METABOLIC PANEL - Abnormal; Notable for the following components:      Result Value   Sodium 132 (*)    Chloride 96 (*)    BUN 73 (*)    Creatinine, Ser 10.07 (*)    GFR, Estimated 6 (*)    All other  components within normal limits  CBC WITH DIFFERENTIAL/PLATELET - Abnormal; Notable for the following components:   RBC 4.17 (*)    Platelets 45 (*)    Lymphs Abs 0.5 (*)    All other components within normal limits  PROTIME-INR - Abnormal; Notable for the following components:   Prothrombin Time 15.9 (*)    INR 1.3 (*)    All other components within normal limits  BRAIN NATRIURETIC PEPTIDE - Abnormal; Notable for the following components:   B Natriuretic Peptide 1,044.9 (*)    All other components within normal limits  TROPONIN I (HIGH SENSITIVITY) - Abnormal; Notable for the following components:   Troponin I (High Sensitivity)  464 (*)    All other components within normal limits  RESP PANEL BY RT-PCR (RSV, FLU A&B, COVID)  [id number]  CULTURE, BLOOD (ROUTINE X 2)  CULTURE, BLOOD (ROUTINE X 2)  LACTIC ACID, PLASMA  URINALYSIS, W/ REFLEX TO CULTURE (INFECTION SUSPECTED)  LACTIC ACID, PLASMA  SEDIMENTATION RATE  C-REACTIVE PROTEIN    EKG EKG Interpretation Date/Time:  Saturday January 09 2023 16:16:27 EDT Ventricular Rate:  97 PR Interval:  168 QRS Duration:  106 QT Interval:  379 QTC Calculation: 482 R Axis:   43  Text Interpretation: Sinus rhythm Biatrial enlargement LVH with secondary repolarization abnormality Anterior Q waves, possibly due to LVH Confirmed by Gloris Manchester [phone]) on 01/09/2023 4:27:42 PM  Radiology CT CHEST ABDOMEN PELVIS WO CONTRAST  Result Date: 01/09/2023 CLINICAL DATA:  Cough, weight loss. EXAM: CT CHEST, ABDOMEN AND PELVIS WITHOUT CONTRAST TECHNIQUE: Multidetector CT imaging of the chest, abdomen and pelvis was performed following the standard protocol without IV contrast. RADIATION DOSE REDUCTION: This exam was performed according to the departmental dose-optimization program which includes automated exposure control, adjustment of the mA and/or kV according to patient size and/or use of iterative reconstruction technique. COMPARISON:  04/27/2022  FINDINGS: CT CHEST FINDINGS Cardiovascular: Interval left brachiocephalic vein stenting traversing the previously narrowed segment. Aortic stent again noted along with prosthetic aortic valve. Postoperative findings from debranching. Left anterior descending, right, and circumflex coronary artery atheromatous vascular calcification. Today's noncontrast exam does not assess patency. Mild cardiomegaly. Mediastinum/Nodes: Unremarkable Lungs/Pleura: Mild centrilobular emphysema. Mild scarring in the left lower lobe and lingula. Airway thickening is present, suggesting bronchitis or reactive airways disease. Musculoskeletal: Prior median sternotomy. Paucity of adipose tissues. CT ABDOMEN PELVIS FINDINGS Hepatobiliary: Stable 1 cm hypodense lesion in the right hepatic lobe on image 59 series 12, probably a cyst. Similar small stable hypodense lesion in the left hepatic lobe measures 4 mm in diameter on image 11 series 2. No further imaging workup of these lesions is indicated. Small gallstones in the gallbladder as on image 39 series 2. Pancreas: Slightly indistinct margins due to paucity of abdominal adipose tissue. No specific abnormality observed. Spleen: Unremarkable Adrenals/Urinary Tract: Stable moderate atrophy of the left kidney mild atrophy of the right kidney. Adrenal glands unremarkable. Urinary bladder unremarkable. Stomach/Bowel: Paucity of intra-adipose tissue and lack of oral and IV contrast makes separation of bowel loops problematic. No dilated bowel. Vascular/Lymphatic: Calcifications and postoperative findings along the surgical bypass grafts of the celiac trunk, SMA, and renal arteries, roughly similar contours 2 the 04/27/2022 exam, and with re-demonstration the 3.8 cm density just below the operative site which may reflect a postoperative fluid collection. Infrarenal abdominal aortic aneurysm 3.2 cm in diameter on image 41 of series 2, similar to previous. Left iliac artery caliber stable at 3.3 cm,  right common iliac artery caliber stable at 2.1 cm. Reproductive: Unremarkable Other: No supplemental non-categorized findings. Musculoskeletal: Unremarkable IMPRESSION: 1. A definite cause for the patient's weight loss is not identified on today's noncontrast exam. 2. Airway thickening is present, suggesting bronchitis or reactive airways disease. This may be contributing to the patient's cough. 3. Mild centrilobular emphysema. 4. Interval left brachiocephalic vein stenting traversing the previously narrowed segment. 5. Stable contours of postoperative findings in the thoracic aorta with debranching, and generally stable appearance of the abdominal systemic arterial bypasses. Chronic mildly complex postoperative fluid collection below the postoperative site in the abdominal aorta. 6. Stable 3.2 cm infrarenal abdominal aortic aneurysm. Typical recommendation would be follow-up ultrasound every 3 years,  but given the complexity of the postoperative vascular scenario in the chest and abdomen, more frequent surveillance may be indicated as per vascular surgical surveillance protocols. 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 7. Stable moderate atrophy of the left kidney and mild atrophy of the right kidney. 8. Cholelithiasis. 9. Paucity of intra-adipose tissue and lack of oral and IV contrast makes separation of bowel loops difficult. Aortic Atherosclerosis (ICD10-I70.0) and Emphysema (ICD10-J43.9). Electronically Signed   By: Gaylyn Rong M.D.   On: 01/09/2023 15:41   CT Head Wo Contrast  Result Date: 01/09/2023 CLINICAL DATA:  Headaches, increasing frequency or severity. Headache and bilateral arm pain following carotid artery stenting. EXAM: CT HEAD WITHOUT CONTRAST TECHNIQUE: Contiguous axial images were obtained from the base of the skull through the vertex without intravenous contrast. RADIATION DOSE REDUCTION: This exam was performed according to the departmental dose-optimization program  which includes automated exposure control, adjustment of the mA and/or kV according to patient size and/or use of iterative reconstruction technique. 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 No acute infarct, hemorrhage, or mass lesion is present. No significant white matter lesions are present. Deep brain nuclei are within normal limits. The ventricles are of normal size. No significant extraaxial fluid collection is present. Midline structures are within normal limits. The brainstem and cerebellum are within normal limits. Vascular: Extensive atherosclerotic calcifications are present in the cavernous internal carotid arteries bilaterally. Calcifications are present in left vertebral artery. 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 Skull: Calvarium is intact. No focal lytic or blastic lesions are present. No significant extracranial soft tissue lesion is present. Sinuses/Orbits: The paranasal sinuses and mastoid air cells are clear. The globes and orbits are within normal limits. IMPRESSION: 1. No acute intracranial abnormality or significant interval change. 2. 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 3. 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 Electronically Signed   By: Marin Roberts M.D.   On: 01/09/2023 15:32   DG Chest Port 1 View  Result Date: 01/09/2023 CLINICAL DATA:  Questionable sepsis - evaluate for abnormality EXAM: PORTABLE CHEST 1 VIEW COMPARISON:  December 25, 2022 FINDINGS: The cardiomediastinal silhouette is unchanged in contour.Status post endovascular repair of the aorta. Status post median sternotomy. Cardiac loop recorder. No pleural effusion. No pneumothorax. No acute pleuroparenchymal abnormality. Surgical clips project over the RIGHT axilla. IMPRESSION: No acute  cardiopulmonary abnormality. Electronically Signed   By: Meda Klinefelter M.D.   On: 01/09/2023 14:39    Procedures Procedures    Medications Ordered in ED Medications  sodium chloride flush (NS) 0.9 % injection 10 mL (10 mLs Intravenous Given 01/09/23 1536)  vancomycin (VANCOCIN) IVPB 1000 mg/200 mL premix (has no administration in time range)  aspirin chewable tablet 324 mg (has no administration in time range)  acetaminophen (TYLENOL) tablet 1,000 mg (1,000 mg Oral Given 01/09/23 1501)  metoCLOPramide (REGLAN) injection 10 mg (10 mg Intravenous Given 01/09/23 1532)  diphenhydrAMINE (BENADRYL) injection 12.5 mg (12.5 mg Intravenous Given 01/09/23 1533)  lactated ringers bolus 500 mL (0 mLs Intravenous Stopped 01/09/23 1630)  metroNIDAZOLE (FLAGYL) IVPB 500 mg (500 mg Intravenous New Bag/Given 01/09/23 1637)  fentaNYL (SUBLIMAZE) injection 50 mcg (50 mcg Intravenous Given 01/09/23 1549)  ceFEPIme (MAXIPIME) 1 g in sodium chloride 0.9 % 100 mL IVPB (0 g Intravenous  Stopped 01/09/23 1636)    ED Course/ Medical Decision Making/ A&P                                 Medical Decision Making Amount and/or Complexity of Data Reviewed Labs: ordered. Radiology: ordered. ECG/medicine tests: ordered.  Risk OTC drugs. Prescription drug management. Decision regarding hospitalization.   This patient presents to the ED for concern of multiple complaints, this involves an extensive number of treatment options, and is a complaint that carries with it a high risk of complications and morbidity.  The differential diagnosis includes endocarditis, graft infection, volume overload, metabolic derangements, ACS   Co morbidities that complicate the patient evaluation  HLD, HTN, aortic dissection, aortic valve replacement, CAD, inguinal hernia, anxiety, depression, alcohol abuse, ESRD, COPD, CHF, GERD, CVA   Additional history obtained:  Additional history obtained from N/A External records  from outside source obtained and reviewed including EMR   Lab Tests:  I Ordered, and personally interpreted labs.  The pertinent results include: Normal hemoglobin, no leukocytosis, elevated BUN and creatinine consistent with ESRD, normal electrolytes, elevated troponin and BNP   Imaging Studies ordered:  I ordered imaging studies including chest x-ray, CT of head, chest, abdomen, pelvis I independently visualized and interpreted imaging which showed no acute findings I agree with the radiologist interpretation   Cardiac Monitoring: / EKG:  The patient was maintained on a cardiac monitor.  I personally viewed and interpreted the cardiac monitored which showed an underlying rhythm of: Sinus rhythm   Consultations Obtained:  I requested consultation with the cardiologist, Dr. Orson Aloe,  and discussed lab and imaging findings as well as pertinent plan - they recommend: Continue medical management, antibiotics, blood cultures, echocardiogram.  Continue to trend troponins.  Further Cardiology consultation as needed. I requested consultation with the nephrologist, Dr. Juel Burrow,  and discussed lab and imaging findings as well as pertinent plan - they recommend: Admission to [facility].  Dr. Juel Burrow will arrange dialysis   Problem List / ED Course / Critical interventions / Medication management  Patient presents for ongoing symptoms of fatigue, subjective fevers, chest pain, p.o. intolerance, headache, dizziness, and generalized weakness for the past several weeks.  On arrival, he was found to have tachycardia and fever.  Tylenol was given for antipyresis.  On exam, he is overall well-appearing.  He has no focal neurologic deficits.  Left arm fistula has a strong thrill.  Distal extremities well-perfused.  On auscultation of his chest, he has prominent cardiac murmur.  This has been identified before.  Last cardiology note notes 3/6.  I feel that this murmur may have worsened since then.  In addition to  Tylenol, patient was given Reglan and Benadryl for treatment of his headache.  Broad workup was initiated.  Broad-spectrum antibiotics were ordered for empiric treatment of sepsis.  Gentle IVF started.  Flank pain and headache medication, patient did have improved symptoms.  Fortunately, CMP shows normal electrolytes.  CBC shows a normal white blood cell count.  Troponin and BNP are elevated.  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  I spoke with cardiologist on-call, Dr. Orson Aloe, who recommends continued medical management and workup.  I spoke with nephrologist, Dr. Juel Burrow, who will arrange for dialysis at [facility].  Patient was admitted for further management.  I spoke with admitting hospitalist who feels the patient would benefit for now from getting started on heparin.  He does  have ongoing chest pain and troponins are elevated.  He has not had any recent bleeding.  CT scan of head did not show any evidence of septic emboli.  Heparin was ordered.  Additionally, dose of NTG was ordered to see if this improves his chest pain.  Hospitalist to follow-up on reassessment.  Heparin GTT was ultimately held due to thrombocytopenia. I ordered medication including broad-spectrum antibiotics for empiric treatment of sepsis; Benadryl, Reglan, Tylenol for headache; fentanyl for chest pain; IVF for hydration Reevaluation of the patient after these medicines showed that the patient improved I have reviewed the patients home medicines and have made adjustments as needed   Social Determinants of Health:  Lives independently  CRITICAL CARE Performed by: Gloris Manchester   Total critical care time: 34 minutes  Critical care time was exclusive of separately billable procedures and treating other patients.  Critical care was necessary to treat or prevent imminent or life-threatening deterioration.  Critical care was time spent personally by me on the following activities: development of treatment plan  with patient and/or surrogate as well as nursing, discussions with consultants, evaluation of patient's response to treatment, examination of patient, obtaining history from patient or surrogate, ordering and performing treatments and interventions, ordering and review of laboratory studies, ordering and review of radiographic studies, pulse oximetry and re-evaluation of patient's condition.         Final Clinical Impression(s) / ED Diagnoses Final diagnoses:  Fever of unknown origin  Elevated troponin  Chest pain, unspecified type    Rx / DC Orders ED Discharge Orders     None         Gloris Manchester, MD 01/09/23 1744    Gloris Manchester, MD 01/09/23 Jaye Beagle    Gloris Manchester, MD 01/09/23 [phone]

## 2023-01-09 NOTE — Progress Notes (Addendum)
A consult was received from an ED physician for vancomycin + cefepime per pharmacy dosing.  The patient's profile has been reviewed for ht/wt/allergies/indication/available labs.    Noted ESRD on HD.   A one time order has been placed for cefepime 1 g IV once + vancomycin 1000 mg IV once.  No weight in chart to be able to calculate weight-based loading dose.  Further antibiotics/pharmacy consults should be ordered by admitting physician if indicated.                       Thank you, Cindi Carbon, PharmD 01/09/2023  2:27 PM

## 2023-01-09 NOTE — ED Triage Notes (Signed)
For the last couple of weeks he has had a cough that leads to chest pain. A stent was placed in his neck 3 wks ago.Since then he has had a headache and bilateral arm pain (ache). He also complains of weight loss (about 25 lbs.). Patient was found to be febrile here.

## 2023-01-09 NOTE — ED Notes (Signed)
When asked if the nitroglycerin helped his chest pain, he replied "I really can't tell."

## 2023-01-09 NOTE — H&P (Signed)
History and Physical    Patient: [name] QMV:[id number] DOB: 1970-04-09 DOA: 01/09/2023 DOS: the patient was seen and examined on 01/09/2023 PCP: Loura Back, NP  Patient coming from: Home  Chief Complaint:  Chief Complaint  Patient presents with   Fever   Chest Pain   Headache   HPI: [name] is a 52 y.o. male with medical history significant of hypertension, hyperlipidemia, CAD s/p CABG,  2 MIs, 3 CVAs, ESRD on hemodialysis once a week, on Tuesdays (patient still forms urine), descending thoracic aortic aneurysm, history of aortic valve replacement with bioprosthetic valve on statin and aspirin daily, severe left jugular venous distention for the past 8 months, tobacco use disorder who presents to the emergency department due to 2-week onset of midsternal chest pain described as pressure-like with radiation to both sides of the neck and bilateral arms.  Chest pain worsens on exertion, taking a deep breath and lying flat in bed.  Chest pain was reproducible and only slightly improves on sitting up.  He complained of 3-day onset of subjective fever, nausea and vomiting after eating which has resulted in decreased oral intake.patient also complained of weight loss.  Patient was admitted from 10/5 to 10/6 due to vertigo during which neurology was consulted and there was concern for acute vascular vestibular vertigo and DAPT x 3 months followed by aspirin alone was recommended.  CT angiography of head and neck done during that admission showed significant vascular abnormalities with severely distended left JVD, vascular surgery was consulted and left brachiocephalic stenosis or occlusion was suspected.  Patient underwent left arm fistulagram, Left brachiocephalic arteriovenous angioplasty and stenting (16x34mm Abre) on 10/11 with Dr. Lenell Antu.  He complained of worsening of chest pain including pain on sides of the neck few days after this procedure.   ED Course:  In the emergency  department, patient was noted to be febrile with a temperature of 102.81F cardiac with HR of 113 bpm, BP was 156/97, O2 sat was 98% and respiratory rate was 15/min.  Workup in the ED showed normal CBC except for thrombocytopenia with platelets of 45.  BMP showed sodium of 132, chloride 96, BUN/creatinine 73/10.07, BNP 1044.9 (this was 198.1 on 04/27/2022).  Troponin x 1 was 464 (this was 29 on 10/7).  Lactic acid was 1.5.  Influenza A, B, SARS, respiratory, RSV was negative. CT chest, abdomen and pelvis showed stable contours of postoperative findings in the thoracic aorta with debranching, and generally stable appearance of the abdominal systemic arterial bypasses and Interval left brachiocephalic vein stenting traversing the previously narrowed segment. CT head without contrast showed no acute intracranial abnormality Chest x-ray showed no acute cardiopulmonary abnormality Aspirin 325 mg p.o. x 1 was given, Tylenol was given due to fever.  Patient was empirically started on IV cefepime, Flagyl, vancomycin.  Nitroglycerin sublingual x 1 was also given.  Fentanyl 50 mcg was given and 500 mL of LR was provided. Nephrologist (Dr. Juel Burrow) was consulted and recommended admitting patient to [facility] with plan to follow-up on patient's dialysis needs per ED medical record Cardiologist (Dr. Orson Aloe) was consulted and recommended continue medical management, antibiotics, blood cultures and echocardiogram and to continue to trend troponins.  Further cardiology consultation as needed was also recommended per ED medical record. Hospitalist was asked to admit patient for further evaluation and management.  Review of Systems: Review of systems as noted in the HPI. All other systems reviewed and are negative.   Past Medical History:  Diagnosis Date  Adenomatous colon polyp 08/2015   Anxiety    Aortic disease (HCC)    Aortic dissection (HCC)    a. admx 04/2014 >> L renal infarct; a/c renal failure >> b.  s/p  Bioprosthetic Bentall and total arch replacement and staged endovascular repair of descending aortic aneurysm ([facility] - Dr. Kizzie Bane)   CAD (coronary artery disease)    a. LHC 4/16:  oD1 60%   Cardiomyopathy (HCC)    a. non-ischemic - probably related to untreated HTN and ETOH abuse - Echo 3/13 with EF 35-40% >> b. Echo 4/16: Severe LVH, EF 55-60%, moderate AI, moderate MR, mild LAE, trivial effusion, known type B dissection with communication between true and false lumens with suprasternal images suggesting dissection plane may propagate to at least left subclavian takeoff, root above aortic valve okay     Chronic abdominal pain    Chronic combined systolic and diastolic congestive heart failure (HCC)    a. 05/2011: Adm with pulm edema/HTN urgency, EF 35-40% with diffuse hypokinesis and moderate to severe mitral regurgitation. Cardiomyopathy likely due to uncontrolled HTN and ETOH abuse - cath deferred due to renal insufficiency (felt due to uncontrolled HTN). bElzie Rings MV 06/2011: EF 37% and no ischemia or infarction. c. EF 45-50% by echo 01/2012.   Chronic sinusitis    CKD (chronic kidney disease)    dialysis m,t,th,fri   DDD (degenerative disc disease), lumbar    Depression    Descending thoracic aortic aneurysm (HCC)    Dissecting aneurysm of thoracic aorta (HCC)    ESRD (end stage renal disease) (HCC)    dialysis 3 x week, 8 months ago started   ETOH abuse    a. Reported to have quit 05/2011.   Frequent headaches    GERD (gastroesophageal reflux disease)    Headache(784.0)    "q other day" (08/08/2013)   Heart murmur    Hemorrhoid thrombosis    History of echocardiogram    Echo 1/17:  Severe LVH, EF 55-60%, no RWMA, Gr 2 DD, AVR ok, mild to mod MR, mild LAE, mild reduced RVSF, mod RAE   History of medication noncompliance    HYPERLIPIDEMIA    Hypertension    a. Hx of HTN urgency secondary to noncompliance. b. urinary metanephrine and catecholeamine levels normal 2013.  c. Renal art [name]  1/16:  No evidence of renal artery stenosis noted bilaterally.   INGUINAL HERNIA    Pneumonia ~ 2013   Renal insufficiency    Serrated adenoma of colon 08/2015   Stroke [facility])    Tobacco abuse    Valvular heart disease    a. Echo 05/2011: moderate to severe eccentric MR and mild to moderate AI with prolapsing left coronary cusp. b. Echo 01/2012: mild-mod AI, mild dilitation of aortic root, mild MR.;  c. Echo 1/16: Severe LVH consistent with hypertrophic cardio myopathy, EF 50%, no RWMA, mod AI, mild MR, mild RAE, dilated Ao root (40 mm);    Past Surgical History:  Procedure Laterality Date   A/V FISTULAGRAM Left 03/07/2021   Procedure: A/V FISTULAGRAM;  Surgeon: Chuck Hint, MD;  Location[facility] INVASIVE CV LAB;  Service: Cardiovascular;  Laterality: Left;   A/V FISTULAGRAM Left 07/18/2021   Procedure: A/V Fistulagram;  Surgeon: Chuck Hint, MD;  Location: [facility] INVASIVE CV LAB;  Service: Cardiovascular;  Laterality: Left;   A/V FISTULAGRAM Left 12/25/2022   Procedure: A/V Fistulagram;  Surgeon: Leonie Douglas, MD;  Location: [facility] INVASIVE CV LAB;  Service: Cardiovascular;  Laterality: Left;   ANKLE SURGERY Bilateral    Fractures bilaterally   AORTIC VALVE SURGERY  09/2014   AV FISTULA PLACEMENT Left 09/09/2020   Procedure: LEFT ARM ARTERIOVENOUS (AV) FISTULA CREATION;  Surgeon: Leonie Douglas, MD;  Location: [facility] OR;  Service: Vascular;  Laterality: Left;   BASCILIC VEIN TRANSPOSITION Left 10/09/2020   Procedure: LEFT SECOND STAGE BASILIC VEIN TRANSPOSITION;  Surgeon: Leonie Douglas, MD;  Location: [facility] OR;  Service: Vascular;  Laterality: Left;  PERIPHERAL NERVE BLOCK   CORONARY ANGIOGRAPHY N/A 12/06/2017   Procedure: CORONARY ANGIOGRAPHY;  Surgeon: Lyn Records, MD;  Location: [facility] INVASIVE CV LAB;  Service: Cardiovascular;  Laterality: N/A;   CORONARY ANGIOGRAPHY N/A 09/26/2018   Procedure: CORONARY ANGIOGRAPHY;  Surgeon: Yvonne Kendall, MD;  Location: [facility] INVASIVE CV LAB;   Service: Cardiovascular;  Laterality: N/A;   CORONARY STENT INTERVENTION N/A 12/10/2017   Procedure: CORONARY STENT INTERVENTION;  Surgeon: Lennette Bihari, MD;  Location: [facility] INVASIVE CV LAB;  Service: Cardiovascular;  Laterality: N/A;   FOOT FRACTURE SURGERY Bilateral 2004-2010   "got pins in both of them"   HEMORRHOID SURGERY N/A 06/15/2015   Procedure: HEMORRHOIDECTOMY;  Surgeon: Almond Lint, MD;  Location: [facility] OR;  Service: General;  Laterality: N/A;   INGUINAL HERNIA REPAIR Right ~ 1996   INSERTION OF DIALYSIS CATHETER N/A 09/09/2020   Procedure: INSERTION OF TUNNELED DIALYSIS PALINDROME 19cm CATHETER;  Surgeon: Leonie Douglas, MD;  Location: [facility] OR;  Service: Vascular;  Laterality: N/A;   LEFT HEART CATH AND CORONARY ANGIOGRAPHY N/A 02/05/2021   Procedure: LEFT HEART CATH AND CORONARY ANGIOGRAPHY;  Surgeon: Laurey Morale, MD;  Location: [facility] INVASIVE CV LAB;  Service: Cardiovascular;  Laterality: N/A;   LEFT HEART CATHETERIZATION WITH CORONARY ANGIOGRAM N/A 06/21/2014   Procedure: LEFT HEART CATHETERIZATION WITH CORONARY ANGIOGRAM;  Surgeon: Laurey Morale, MD;  Location: [facility] CATH LAB;  Service: Cardiovascular;  Laterality: N/A;   LOOP RECORDER INSERTION N/A 06/19/2016   Procedure: Loop Recorder Insertion;  Surgeon: [facility] Salvia, MD;  Location: [facility] INVASIVE CV LAB;  Service: Cardiovascular;  Laterality: N/A;   PERIPHERAL VASCULAR BALLOON ANGIOPLASTY  03/07/2021   Procedure: PERIPHERAL VASCULAR BALLOON ANGIOPLASTY;  Surgeon: Chuck Hint, MD;  Location: [facility] INVASIVE CV LAB;  Service: Cardiovascular;;   PERIPHERAL VASCULAR BALLOON ANGIOPLASTY  07/18/2021   Procedure: PERIPHERAL VASCULAR BALLOON ANGIOPLASTY;  Surgeon: Chuck Hint, MD;  Location: [facility] INVASIVE CV LAB;  Service: Cardiovascular;;   PERIPHERAL VASCULAR INTERVENTION Left 12/25/2022   Procedure: PERIPHERAL VASCULAR INTERVENTION;  Surgeon: Leonie Douglas, MD;  Location: [facility] INVASIVE CV LAB;  Service: Cardiovascular;   Laterality: Left;  stent to brachiocephalic vein   PERIPHERAL VASCULAR ULTRASOUND/IVUS Left 12/25/2022   Procedure: Peripheral Vascular Ultrasound/IVUS;  Surgeon: Leonie Douglas, MD;  Location: [facility] INVASIVE CV LAB;  Service: Cardiovascular;  Laterality: Left;   TEE WITHOUT CARDIOVERSION N/A 03/12/2016   Procedure: TRANSESOPHAGEAL ECHOCARDIOGRAM (TEE);  Surgeon: Thurmon Fair, MD;  Location: [facility] ENDOSCOPY;  Service: Cardiovascular;  Laterality: N/A;   TEE WITHOUT CARDIOVERSION N/A 10/16/2020   Procedure: TRANSESOPHAGEAL ECHOCARDIOGRAM (TEE);  Surgeon: Laurey Morale, MD;  Location: [facility] ENDOSCOPY;  Service: Cardiovascular;  Laterality: N/A;   UPPER EXTREMITY VENOGRAPHY Right 07/18/2021   Procedure: UPPER EXTREMITY VENOGRAPHY;  Surgeon: Chuck Hint, MD;  Location: [facility] INVASIVE CV LAB;  Service: Cardiovascular;  Laterality: Right;    Social History:  reports that he has been smoking cigarettes. He has a 5.8 pack-year smoking history. He has  been exposed to tobacco smoke. He has never used smokeless tobacco. He reports that he does not currently use alcohol after a past usage of about 1.0 standard drink of alcohol per week. He reports current drug use. Drug: Marijuana.   Allergies  Allergen Reactions   Clonidine Derivatives Nausea And Vomiting and Other (See Comments)    Tablets by mouth = Tolerated  Patches on the skin = Nausea & vomiting after wearing 4-5 days    Imdur [Isosorbide Nitrate] Other (See Comments)    Headaches -- patient instructed to continue taking    Tramadol Other (See Comments)    Headaches     Family History  Problem Relation Age of Onset   Hypertension Mother    Hypertension Other    Colon cancer Paternal Uncle    Stroke Maternal Aunt    Heart attack Brother    Hypertension Brother    Diabetes Maternal Aunt    Lung cancer Maternal Uncle    Stroke Maternal Uncle    Other Sister        "breathing machine at night"   Headache Sister    Thyroid disease  Sister    Throat cancer Neg Hx    Pancreatic cancer Neg Hx    Esophageal cancer Neg Hx    Kidney disease Neg Hx    Liver disease Neg Hx      Prior to Admission medications   Medication Sig Start Date End Date Taking? Authorizing Provider  aspirin EC 81 MG EC tablet Take 1 tablet (81 mg total) by mouth daily. 12/12/17  Yes [facility], Roe Rutherford, PA  Evolocumab (REPATHA SURECLICK) 140 MG/ML SOAJ Inject 140 mg into the skin every 14 (fourteen) days. 10/28/22  Yes Hilty, Lisette Abu, MD  hydrALAZINE (APRESOLINE) 100 MG tablet Take 1 tablet (100 mg total) by mouth 3 (three) times daily. Patient taking differently: Take 50 mg by mouth 3 (three) times daily. 11/06/21  Yes Laurey Morale, MD  HYDROcodone-acetaminophen (NORCO/VICODIN) 5-325 MG tablet Take 1 tablet by mouth every 6 (six) hours as needed for moderate pain. 12/21/22  Yes Vanetta Mulders, MD  lidocaine-prilocaine (EMLA) cream Apply 1 application. topically as needed (prior to port being accessed). Use on dialysis days (Tues, Thurs and Sat)   Yes [provider]  meclizine (ANTIVERT) 25 MG tablet Take 1 tablet (25 mg total) by mouth 3 (three) times daily as needed for dizziness. 12/21/22  Yes Vanetta Mulders, MD  pantoprazole (PROTONIX) 40 MG tablet Take 40 mg by mouth daily.   Yes [provider]  STIOLTO RESPIMAT 2.5-2.5 MCG/ACT AERS Inhale 2 puffs into the lungs at bedtime. 12/29/22  Yes [provider]  albuterol (VENTOLIN HFA) 108 (90 Base) MCG/ACT inhaler Inhale 1 puff into the lungs every 6 (six) hours as needed for wheezing or shortness of breath. 05/24/21   [provider]  amLODipine (NORVASC) 10 MG tablet TAKE 1 TABLET(10 MG) BY MOUTH DAILY Patient taking differently: Take 10 mg by mouth daily. 07/15/21   Laurey Morale, MD  cloNIDine (CATAPRES) 0.1 MG tablet Take 1 tablet (0.1 mg total) by mouth 2 (two) times daily. Patient not taking: Reported on 01/09/2023 10/08/22   Tonye Becket D, NP  clopidogrel  (PLAVIX) 75 MG tablet Take 1 tablet (75 mg total) by mouth daily. 12/21/22   Vanetta Mulders, MD  oxyCODONE-acetaminophen (PERCOCET) 5-325 MG tablet Take 1 tablet by mouth every 4 (four) hours as needed for severe pain. Patient not taking: Reported on 01/09/2023  12/25/22 12/25/23  Leonie Douglas, MD  sildenafil (VIAGRA) 50 MG tablet Take 1 tablet (50 mg total) by mouth daily as needed for erectile dysfunction. 03/30/22   Laurey Morale, MD    Physical Exam: BP 122/74   Pulse 97   Temp 99.2 F (37.3 C) (Oral)   Resp (!) 22   Ht 5\' 11"  (1.803 m)   Wt 59.9 kg   SpO2 98%   BMI 18.41 kg/m   General: 52 y.o. year-old male well developed well nourished in no acute distress.  Alert and oriented x3. HEENT: NCAT, EOMI Neck: Supple, trachea medial Cardiovascular: Tachycardia.  Regular rate and rhythm.  No lower extremity edema. 2/4 pulses in all 4 extremities. Respiratory: Tachypnea.  Clear to auscultation with no wheezes or rales. Good inspiratory effort. Abdomen: Soft, nontender nondistended with normal bowel sounds x4 quadrants. Muskuloskeletal: No cyanosis, clubbing or edema noted bilaterally Neuro: CN II-XII intact, strength 5/5 x 4, sensation, reflexes intact Skin: No ulcerative lesions noted or rashes Psychiatry: Judgement and insight appear normal. Mood is appropriate for condition and setting          Labs on Admission:  Basic Metabolic Panel: Recent Labs  Lab 01/09/23 1539  NA 132*  K 3.7  CL 96*  CO2 22  GLUCOSE 98  BUN 73*  CREATININE 10.07*  CALCIUM 9.9   Liver Function Tests: Recent Labs  Lab 01/09/23 1539  AST 39  ALT 23  ALKPHOS 49  BILITOT 1.2  PROT 8.1  ALBUMIN 4.2   No results for input(s): "LIPASE", "AMYLASE" in the last 168 hours. No results for input(s): "AMMONIA" in the last 168 hours. CBC: Recent Labs  Lab 01/09/23 1539  WBC 6.6  NEUTROABS 5.7  HGB 13.4  HCT 40.7  MCV 97.6  PLT 45*   Cardiac Enzymes: No results for input(s):  "CKTOTAL", "CKMB", "CKMBINDEX", "TROPONINI" in the last 168 hours.  BNP (last 3 results) Recent Labs    03/01/22 1930 04/27/22 0819 01/09/23 1539  BNP 346.6* 198.1* 1,044.9*    ProBNP (last 3 results) No results for input(s): "PROBNP" in the last 8760 hours.  CBG: No results for input(s): "GLUCAP" in the last 168 hours.  Radiological Exams on Admission: CT CHEST ABDOMEN PELVIS WO CONTRAST  Result Date: 01/09/2023 CLINICAL DATA:  Cough, weight loss. EXAM: CT CHEST, ABDOMEN AND PELVIS WITHOUT CONTRAST TECHNIQUE: Multidetector CT imaging of the chest, abdomen and pelvis was performed following the standard protocol without IV contrast. RADIATION DOSE REDUCTION: This exam was performed according to the departmental dose-optimization program which includes automated exposure control, adjustment of the mA and/or kV according to patient size and/or use of iterative reconstruction technique. COMPARISON:  04/27/2022 FINDINGS: CT CHEST FINDINGS Cardiovascular: Interval left brachiocephalic vein stenting traversing the previously narrowed segment. Aortic stent again noted along with prosthetic aortic valve. Postoperative findings from debranching. Left anterior descending, right, and circumflex coronary artery atheromatous vascular calcification. Today's noncontrast exam does not assess patency. Mild cardiomegaly. Mediastinum/Nodes: Unremarkable Lungs/Pleura: Mild centrilobular emphysema. Mild scarring in the left lower lobe and lingula. Airway thickening is present, suggesting bronchitis or reactive airways disease. Musculoskeletal: Prior median sternotomy. Paucity of adipose tissues. CT ABDOMEN PELVIS FINDINGS Hepatobiliary: Stable 1 cm hypodense lesion in the right hepatic lobe on image 59 series 12, probably a cyst. Similar small stable hypodense lesion in the left hepatic lobe measures 4 mm in diameter on image 11 series 2. No further imaging workup of these lesions is indicated. Small gallstones in  the gallbladder as on image 39 series 2. Pancreas: Slightly indistinct margins due to paucity of abdominal adipose tissue. No specific abnormality observed. Spleen: Unremarkable Adrenals/Urinary Tract: Stable moderate atrophy of the left kidney mild atrophy of the right kidney. Adrenal glands unremarkable. Urinary bladder unremarkable. Stomach/Bowel: Paucity of intra-adipose tissue and lack of oral and IV contrast makes separation of bowel loops problematic. No dilated bowel. Vascular/Lymphatic: Calcifications and postoperative findings along the surgical bypass grafts of the celiac trunk, SMA, and renal arteries, roughly similar contours 2 the 04/27/2022 exam, and with re-demonstration the 3.8 cm density just below the operative site which may reflect a postoperative fluid collection. Infrarenal abdominal aortic aneurysm 3.2 cm in diameter on image 41 of series 2, similar to previous. Left iliac artery caliber stable at 3.3 cm, right common iliac artery caliber stable at 2.1 cm. Reproductive: Unremarkable Other: No supplemental non-categorized findings. Musculoskeletal: Unremarkable IMPRESSION: 1. A definite cause for the patient's weight loss is not identified on today's noncontrast exam. 2. Airway thickening is present, suggesting bronchitis or reactive airways disease. This may be contributing to the patient's cough. 3. Mild centrilobular emphysema. 4. Interval left brachiocephalic vein stenting traversing the previously narrowed segment. 5. Stable contours of postoperative findings in the thoracic aorta with debranching, and generally stable appearance of the abdominal systemic arterial bypasses. Chronic mildly complex postoperative fluid collection below the postoperative site in the abdominal aorta. 6. Stable 3.2 cm infrarenal abdominal aortic aneurysm. Typical recommendation would be follow-up ultrasound every 3 years, but given the complexity of the postoperative vascular scenario in the chest and abdomen,  more frequent surveillance may be indicated as per vascular surgical surveillance protocols. 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 7. Stable moderate atrophy of the left kidney and mild atrophy of the right kidney. 8. Cholelithiasis. 9. Paucity of intra-adipose tissue and lack of oral and IV contrast makes separation of bowel loops difficult. Aortic Atherosclerosis (ICD10-I70.0) and Emphysema (ICD10-J43.9). Electronically Signed   By: Gaylyn Rong M.D.   On: 01/09/2023 15:41   CT Head Wo Contrast  Result Date: 01/09/2023 CLINICAL DATA:  Headaches, increasing frequency or severity. Headache and bilateral arm pain following carotid artery stenting. EXAM: CT HEAD WITHOUT CONTRAST TECHNIQUE: Contiguous axial images were obtained from the base of the skull through the vertex without intravenous contrast. RADIATION DOSE REDUCTION: This exam was performed according to the departmental dose-optimization program which includes automated exposure control, adjustment of the mA and/or kV according to patient size and/or use of iterative reconstruction technique. 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 No acute infarct, hemorrhage, or mass lesion is present. No significant white matter lesions are present. Deep brain nuclei are within normal limits. The ventricles are of normal size. No significant extraaxial fluid collection is present. Midline structures are within normal limits. The brainstem and cerebellum are within normal limits. Vascular: Extensive atherosclerotic calcifications are present in the cavernous internal carotid arteries bilaterally. Calcifications are present in left vertebral artery. 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 Skull: Calvarium is intact. No focal lytic or blastic lesions are present. No significant extracranial soft tissue lesion is  present. Sinuses/Orbits: The paranasal sinuses and mastoid air cells are clear. The globes and orbits are within normal limits. IMPRESSION: 1. No acute intracranial abnormality or significant interval change. 2. Remote infarct of the posterior left insular cortex and left parietal lobe is  stable. 3. 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 Electronically Signed   By: Marin Roberts M.D.   On: 01/09/2023 15:32   DG Chest Port 1 View  Result Date: 01/09/2023 CLINICAL DATA:  Questionable sepsis - evaluate for abnormality EXAM: PORTABLE CHEST 1 VIEW COMPARISON:  December 25, 2022 FINDINGS: The cardiomediastinal silhouette is unchanged in contour.Status post endovascular repair of the aorta. Status post median sternotomy. Cardiac loop recorder. No pleural effusion. No pneumothorax. No acute pleuroparenchymal abnormality. Surgical clips project over the RIGHT axilla. IMPRESSION: No acute cardiopulmonary abnormality. Electronically Signed   By: Meda Klinefelter M.D.   On: 01/09/2023 14:39    EKG: I independently viewed the EKG done and my findings are as followed: Sinus tachycardia at rate of 105 bpm.  LVH with secondary repolarization abnormality  Assessment/Plan Present on Admission:  Acute febrile illness  Chest pain  Coronary artery disease involving native coronary artery of native heart with angina pectoris (HCC)  Essential hypertension  Mixed hyperlipidemia  COPD (chronic obstructive pulmonary disease) (HCC)  GERD without esophagitis  Tobacco use  Principal Problem:   Acute febrile illness Active Problems:   Coronary artery disease involving native coronary artery of native heart with angina pectoris (HCC)   Tobacco use   GERD without esophagitis   Essential hypertension   Mixed hyperlipidemia   Chest pain   COPD (chronic obstructive pulmonary disease) (HCC)   End-stage renal disease on hemodialysis (HCC)   SIRS (systemic inflammatory response  syndrome) (HCC)   Acute bronchitis   Chronic heart failure with preserved ejection fraction (HFpEF) (HCC)   Elevated troponin   Weight loss   Chronic idiopathic thrombocytopenia (HCC)   SIRS in the setting of acute febrile illness Patient met sepsis criteria due to fever, tachycardia, tachypnea on arrival to the ED The cause of fever is unknown at this time Patient was empirically started on IV cefepime, vancomycin and Flagyl, we shall continue with IV vancomycin and cefepime at this time with plan to de-escalate/discontinue based on blood culture Continue Tylenol as needed Blood culture pending  Chest pain rule out ACS Elevated troponin Troponin x 1 - 464 > 395.  Chest pain was reproducible, so this is possibly due to type II demand ischemia secondary to pleuritic chest pain from bronchitis CT chest was suggestive of bronchitis or reactive airway disease EKG personally reviewed showed Sinus tachycardia at rate of 105 bpm. Cardiology was consulted and recommended to continue medical management, antibiotics, blood cultures and echocardiogram and to continue to trend troponins. Aspirin 324 mg p.o. x 1 and and nitroglycerin 0.4 mg sublingual x 1 was given. Continue aspirin 81 mg p.o. daily; Patient does not want nitroglycerin Consider cardiology consult for worsening symptoms  Acute Bronchitis Continue duo nebs, Mucinex, Solu-Medrol Continue Protonix to prevent steroid-induced ulcer Continue incentive spirometry and flutter valve  Chronic HFpEF with grade 1 diastolic dysfunction Elevated BNP BNP 1,044.9,  (this was 198.1 on 04/27/2022) Patient does not appear fluid overloaded Chest x-ray personally reviewed showed no acute cardiopulmonary abnormality Continue total input/output, daily weights and fluid restriction Continue heart healthy diet  Echocardiogram done in January 2024 showed LVEF of 60 to 65%.  No RWMA.  Echocardiogram will be done  Weight loss Patient complained of  decreased appetite with no known cause at this time Protein supplement will be provided Dietitian will be consulted admission await further recommendation  Chronic thrombocytopenia No evidence of bleeding or acute infective process. Continue to monitor platelets  Coronary artery disease status post CABG Continue home meds  Essential hypertension (controlled) Continue amlodipine, hydralazine  Mixed hyperlipidemia Patient takes Repatha every 14 days  ESRD on hemodialysis (once a week, Tuesdays) Nephrology consulted to assist with the management Volume and electrolytes managed with hemodialysis Patient still makes urine  COPD (not in acute exacerbation) Patient is not on oxygen supplementation at baseline. No acute issues Resume home bronchodilators  GERD Continue Protonix   Tobacco use disorder Patient was counseled on the importance of tobacco cessation   DVT prophylaxis: SCDs   Advance Care Planning: CODE STATUS: Full code  Consults: Nephrology (by [facility] EDP)  Family Communication: Wife at bedside (all questions answered to satisfaction)  Severity of Illness: The appropriate patient status for this patient is INPATIENT. Inpatient status is judged to be reasonable and necessary in order to provide the required intensity of service to ensure the patient's safety. The patient's presenting symptoms, physical exam findings, and initial radiographic and laboratory data in the context of their chronic comorbidities is felt to place them at high risk for further clinical deterioration. Furthermore, it is not anticipated that the patient will be medically stable for discharge from the hospital within 2 midnights of admission.   * I certify that at the point of admission it is my clinical judgment that the patient will require inpatient hospital care spanning beyond 2 midnights from the point of admission due to high intensity of service, high risk for further deterioration and high  frequency of surveillance required.*  Author: Frankey Shown, DO 01/09/2023 9:21 PM  For on call review [email or web address].

## 2023-01-09 NOTE — Progress Notes (Signed)
Pharmacy Antibiotic Note  [name] is Ewing 52 y.o. male admitted on 01/09/2023 with sepsis and FUO.  Pharmacy has been consulted for vancomycin and cefepime dosing. Patient noted to be in ESRD on HD (although still has some residual renal function). Dialysis schedule unclear at this time.  Plan: Vancomycin 1000 mg + 500 mg IV x 1 loading dose (target 27 mg/L) Cefepime 1g IV q24 hr Plan transfer to [facility] for [facility]; f/u schedule and redose abx as appropriate  Height: 5\' 11"  (180.3 cm) Weight: 59.9 kg (132 lb) IBW/kg (Calculated) : 75.3  Temp (24hrs), Avg:101.3 F (38.5 C), Min:99.2 F (37.3 C), Max:103.3 F (39.6 C)  Recent Labs  Lab 01/09/23 1539  WBC 6.6  CREATININE 10.07*  LATICACIDVEN 1.5    Estimated Creatinine Clearance: 7.4 mL/min (Ewing) (by C-G formula based on SCr of 10.07 mg/dL (H)).    Allergies  Allergen Reactions   Clonidine Derivatives Nausea And Vomiting and Other (See Comments)    Tablets by mouth = Tolerated  Patches on the skin = Nausea & vomiting after wearing 4-5 days    Imdur [Isosorbide Nitrate] Other (See Comments)    Headaches -- patient instructed to continue taking    Tramadol Other (See Comments)    Headaches     Antimicrobials this admission: 10/26 vancomycin >>  10/26 cefepime >>   Dose adjustments this admission: N/Ewing  Microbiology results: 10/26 BCx: sent   Thank you for allowing pharmacy to be Ewing part of this patient's care.  [name] 01/09/2023 8:55 PM

## 2023-01-10 ENCOUNTER — Inpatient Hospital Stay (HOSPITAL_COMMUNITY): Payer: 59

## 2023-01-10 DIAGNOSIS — I25119 Atherosclerotic heart disease of native coronary artery with unspecified angina pectoris: Secondary | ICD-10-CM

## 2023-01-10 DIAGNOSIS — R6510 Systemic inflammatory response syndrome (SIRS) of non-infectious origin without acute organ dysfunction: Secondary | ICD-10-CM

## 2023-01-10 DIAGNOSIS — I5031 Acute diastolic (congestive) heart failure: Secondary | ICD-10-CM

## 2023-01-10 DIAGNOSIS — R7881 Bacteremia: Secondary | ICD-10-CM | POA: Diagnosis not present

## 2023-01-10 DIAGNOSIS — R7989 Other specified abnormal findings of blood chemistry: Secondary | ICD-10-CM

## 2023-01-10 DIAGNOSIS — R509 Fever, unspecified: Secondary | ICD-10-CM

## 2023-01-10 DIAGNOSIS — R079 Chest pain, unspecified: Secondary | ICD-10-CM | POA: Diagnosis not present

## 2023-01-10 LAB — BLOOD CULTURE ID PANEL (REFLEXED) - BCID2

## 2023-01-10 LAB — COMPREHENSIVE METABOLIC PANEL
ALT: 19 U/L (ref 0–44)
AST: 32 U/L (ref 15–41)
Albumin: 3.1 g/dL — ABNORMAL LOW (ref 3.5–5.0)
Alkaline Phosphatase: 42 U/L (ref 38–126)
Anion gap: 16 — ABNORMAL HIGH (ref 5–15)
BUN: 80 mg/dL — ABNORMAL HIGH (ref 6–20)
CO2: 20 mmol/L — ABNORMAL LOW (ref 22–32)
Calcium: 9.4 mg/dL (ref 8.9–10.3)
Chloride: 98 mmol/L (ref 98–111)
Creatinine, Ser: 10.64 mg/dL — ABNORMAL HIGH (ref 0.61–1.24)
GFR, Estimated: 5 mL/min — ABNORMAL LOW (ref >60)
Glucose, Bld: 128 mg/dL — ABNORMAL HIGH (ref 70–99)
Potassium: 3.9 mmol/L (ref 3.5–5.1)
Sodium: 134 mmol/L — ABNORMAL LOW (ref 135–145)
Total Bilirubin: 1 mg/dL (ref 0.3–1.2)
Total Protein: 6.2 g/dL — ABNORMAL LOW (ref 6.5–8.1)

## 2023-01-10 LAB — CBC
HCT: 31.7 % — ABNORMAL LOW (ref 39.0–52.0)
Hemoglobin: 10.6 g/dL — ABNORMAL LOW (ref 13.0–17.0)
MCH: 30.9 pg (ref 26.0–34.0)
MCHC: 33.4 g/dL (ref 30.0–36.0)
MCV: 92.4 fL (ref 80.0–100.0)
Platelets: 34 10*3/uL — ABNORMAL LOW (ref 150–400)
RBC: 3.43 MIL/uL — ABNORMAL LOW (ref 4.22–5.81)
RDW: 13.5 % (ref 11.5–15.5)
WBC: 8.4 10*3/uL (ref 4.0–10.5)
nRBC: 0 % (ref 0.0–0.2)

## 2023-01-10 LAB — C-REACTIVE PROTEIN: CRP: 21.7 mg/dL — ABNORMAL HIGH (ref <1.0)

## 2023-01-10 LAB — ECHOCARDIOGRAM COMPLETE
AR max vel: 1.13 cm2
AV Area VTI: 0.9 cm2
AV Area mean vel: 1.12 cm2
AV Mean grad: 31.2 mm[Hg]
AV Peak grad: 50.6 mm[Hg]
Ao pk vel: 3.56 m/s
Area-P 1/2: 3.99 cm2
Height: 71 in
S' Lateral: 3.5 cm
Weight: 2112 [oz_av]

## 2023-01-10 LAB — MAGNESIUM: Magnesium: 1.8 mg/dL (ref 1.7–2.4)

## 2023-01-10 LAB — TROPONIN I (HIGH SENSITIVITY)
Troponin I (High Sensitivity): 383 ng/L (ref <18)
Troponin I (High Sensitivity): 336 ng/L (ref <18)

## 2023-01-10 MED ORDER — ALBUTEROL SULFATE (2.5 MG/3ML) 0.083% IN NEBU: 2.5000 mg | INHALATION_SOLUTION | Freq: Four times a day (QID) | RESPIRATORY_TRACT | Status: DC | PRN

## 2023-01-10 MED ORDER — MORPHINE SULFATE (PF) 2 MG/ML IV SOLN
2.0000 mg | INTRAVENOUS | Status: DC | PRN
Start: 2023-01-10 — End: 2023-01-10

## 2023-01-10 MED ORDER — PANTOPRAZOLE SODIUM 40 MG PO TBEC
40.0000 mg | DELAYED_RELEASE_TABLET | Freq: Every day | ORAL | Status: DC
  Administered 2023-01-10 – 2023-01-14 (×5): 40 mg via ORAL
  Filled 2023-01-10 (×5): qty 1

## 2023-01-10 MED ORDER — ASPIRIN 81 MG PO TBEC
81.0000 mg | DELAYED_RELEASE_TABLET | Freq: Every day | ORAL | Status: DC
  Administered 2023-01-10 – 2023-01-14 (×5): 81 mg via ORAL
  Filled 2023-01-10 (×5): qty 1

## 2023-01-10 MED ORDER — FENTANYL CITRATE PF 50 MCG/ML IJ SOSY
50.0000 ug | PREFILLED_SYRINGE | INTRAMUSCULAR | Status: DC | PRN
  Administered 2023-01-10 – 2023-01-13 (×8): 50 ug via INTRAVENOUS
  Filled 2023-01-10 (×8): qty 1

## 2023-01-10 MED ORDER — HEPARIN SODIUM (PORCINE) 5000 UNIT/ML IJ SOLN: 5000.0000 [IU] | Freq: Three times a day (TID) | INTRAMUSCULAR | Status: DC

## 2023-01-10 MED ORDER — CEFAZOLIN SODIUM-DEXTROSE 1-4 GM/50ML-% IV SOLN
1.0000 g | INTRAVENOUS | Status: DC
  Administered 2023-01-10 – 2023-01-13 (×4): 1 g via INTRAVENOUS
  Filled 2023-01-10 (×4): qty 50

## 2023-01-10 MED ORDER — HYDRALAZINE HCL 50 MG PO TABS
50.0000 mg | ORAL_TABLET | Freq: Three times a day (TID) | ORAL | Status: DC
  Administered 2023-01-10 – 2023-01-14 (×13): 50 mg via ORAL
  Filled 2023-01-10 (×13): qty 1

## 2023-01-10 MED ORDER — AMLODIPINE BESYLATE 5 MG PO TABS
5.0000 mg | ORAL_TABLET | Freq: Every day | ORAL | Status: DC
  Administered 2023-01-10 – 2023-01-13 (×4): 5 mg via ORAL
  Filled 2023-01-10 (×4): qty 1

## 2023-01-10 NOTE — Progress Notes (Signed)
Patient gone to ECHO

## 2023-01-10 NOTE — Significant Event (Signed)
[facility] night coverage note:  Pt seen and evaluated at bedside.  Tele called with ST elevations on monitor.  Repeat EKG performed.  Trops in the 400s and flat so far this evening.  Pt is having chest pain, that radiates to L arm and neck.  Though does also seem somewhat pleuritic as well (worse with deep breaths).  This CP has been ongoing for ~3 weeks he says (since before Dr. 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  And is essentially unchanged today.  Patient febrile to 102.3.  RN gave Tylenol.  Dr. Orson Aloe (cardiologist on call) notified, wanted [name] to repeat EKG. Will give him a call back now that EKG repeat done Next trop ordered Morphine ordered for pain

## 2023-01-10 NOTE — Progress Notes (Signed)
PHARMACY - PHYSICIAN COMMUNICATION CRITICAL VALUE ALERT - BLOOD CULTURE IDENTIFICATION (BCID)  [name] is an 52 y.o. male who presented to [facility] on 01/09/2023 with a chief complaint of fever, N/V, chest pain, and headache. Patient admitted for treatment of sepsis workup.  Assessment: Blood cultures x2 with 4/4 bottles growing Staph epidermidis with no resistance detected  Name of physician (or Provider) Contacted: Dr. Sharl Ma  Current antibiotics: Cefepime and Vancomycin  Changes to prescribed antibiotics recommended:  Recommend discontinuing cefepime and vancomycin and de-escalating therapy to cefazolin 1g IV q24h (to be given after HD on dialysis days) Recommendations accepted by provider  Results for orders placed or performed during the hospital encounter of 01/09/23  Blood Culture ID Panel (Reflexed) (Collected: 01/09/2023  3:39 PM)  Result Value Ref Range   Enterococcus faecalis NOT DETECTED NOT DETECTED   Enterococcus Faecium NOT DETECTED NOT DETECTED   Listeria monocytogenes NOT DETECTED NOT DETECTED   Staphylococcus species DETECTED (A) NOT DETECTED   Staphylococcus aureus (BCID) NOT DETECTED NOT DETECTED   Staphylococcus epidermidis DETECTED (A) NOT DETECTED   Staphylococcus lugdunensis NOT DETECTED NOT DETECTED   Streptococcus species NOT DETECTED NOT DETECTED   Streptococcus agalactiae NOT DETECTED NOT DETECTED   Streptococcus pneumoniae NOT DETECTED NOT DETECTED   Streptococcus pyogenes NOT DETECTED NOT DETECTED   A.calcoaceticus-baumannii NOT DETECTED NOT DETECTED   Bacteroides fragilis NOT DETECTED NOT DETECTED   Enterobacterales NOT DETECTED NOT DETECTED   Enterobacter cloacae complex NOT DETECTED NOT DETECTED   Escherichia coli NOT DETECTED NOT DETECTED   Klebsiella aerogenes NOT DETECTED NOT DETECTED   Klebsiella oxytoca NOT DETECTED NOT DETECTED   Klebsiella pneumoniae NOT DETECTED NOT DETECTED   Proteus species NOT DETECTED NOT DETECTED   Salmonella  species NOT DETECTED NOT DETECTED   Serratia marcescens NOT DETECTED NOT DETECTED   Haemophilus influenzae NOT DETECTED NOT DETECTED   Neisseria meningitidis NOT DETECTED NOT DETECTED   Pseudomonas aeruginosa NOT DETECTED NOT DETECTED   Stenotrophomonas maltophilia NOT DETECTED NOT DETECTED   Candida albicans NOT DETECTED NOT DETECTED   Candida auris NOT DETECTED NOT DETECTED   Candida glabrata NOT DETECTED NOT DETECTED   Candida krusei NOT DETECTED NOT DETECTED   Candida parapsilosis NOT DETECTED NOT DETECTED   Candida tropicalis NOT DETECTED NOT DETECTED   Cryptococcus neoformans/gattii NOT DETECTED NOT DETECTED   Methicillin resistance mecA/C NOT DETECTED NOT DETECTED    Wilburn Cornelia, PharmD, BCPS Clinical Pharmacist 01/10/2023 9:04 AM   Please refer to AMION for pharmacy phone number

## 2023-01-10 NOTE — Progress Notes (Signed)
*  PRELIMINARY RESULTS* Echocardiogram 2D Echocardiogram has been performed.  [name] 01/10/2023, 9:52 AM

## 2023-01-10 NOTE — Consult Note (Signed)
[facility]       Reason for Consult:bacteremia    Referring Physician: Dr. Sharl Ma  Principal Problem:   Acute febrile illness Active Problems:   Tobacco use   Thrombocytopenia (HCC)   GERD without esophagitis   Essential hypertension   Mixed hyperlipidemia   Coronary artery disease involving native coronary artery of native heart with angina pectoris (HCC)   Chest pain   COPD (chronic obstructive pulmonary disease) (HCC)   End-stage renal disease on hemodialysis (HCC)   SIRS (systemic inflammatory response syndrome) (HCC)   Acute bronchitis   Chronic heart failure with preserved ejection fraction (HFpEF) (HCC)   Elevated troponin   Weight loss    amLODipine  5 mg Oral Daily   aspirin EC  81 mg Oral Daily   dextromethorphan-guaiFENesin  1 tablet Oral BID   feeding supplement  237 mL Oral BID BM   hydrALAZINE  50 mg Oral TID   methylPREDNISolone (SOLU-MEDROL) injection  40 mg Intravenous Q12H   pantoprazole  40 mg Oral Daily   sodium chloride flush  10 mL Intravenous Q12H    Recommendations: Continue with cefazolin Repeat blood cultures TEE   Assessment: He has bacteremia with Staph epi in both sets associated with fever c/w bacteremia.  He has a prosthetic valve so concern for possible endocarditis and recommend a TEE.    HPI: [name] is a 52 y.o. male with a history of ESRD on intermittent dialysis which is scheduled to be twice a week but he only does once weekly came in with chest pain.  Found to be febrile to 103 and now blood cultures with 4/4 with GPC and Staph epidermidis on BCID.  He recently underwent left brachiocephalic arteriovenous angioplasty with stenting by Dr. Neysa Bonito on 10/11.  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   Review of Systems:  Constitutional: negative for fevers and chills All other systems reviewed and are negative    Past Medical History:  Diagnosis Date   Adenomatous colon  polyp 08/2015   Anxiety    Aortic disease (HCC)    Aortic dissection (HCC)    a. admx 04/2014 >> L renal infarct; a/c renal failure >> b.  s/p Bioprosthetic Bentall and total arch replacement and staged endovascular repair of descending aortic aneurysm ([facility] - Dr. Kizzie Bane)   CAD (coronary artery disease)    a. LHC 4/16:  oD1 60%   Cardiomyopathy (HCC)    a. non-ischemic - probably related to untreated HTN and ETOH abuse - Echo 3/13 with EF 35-40% >> b. Echo 4/16: Severe LVH, EF 55-60%, moderate AI, moderate MR, mild LAE, trivial effusion, known type B dissection with communication between true and false lumens with suprasternal images suggesting dissection plane may propagate to at least left subclavian takeoff, root above aortic valve okay     Chronic abdominal pain    Chronic combined systolic and diastolic congestive heart failure (HCC)    a. 05/2011: Adm with pulm edema/HTN urgency, EF 35-40% with diffuse hypokinesis and moderate to severe mitral regurgitation. Cardiomyopathy likely due to uncontrolled HTN and ETOH abuse - cath deferred due to renal insufficiency (felt due to uncontrolled HTN). bElzie Rings MV 06/2011: EF 37% and no ischemia or infarction. c. EF 45-50% by echo 01/2012.   Chronic sinusitis    CKD (chronic kidney disease)    dialysis m,t,th,fri   DDD (degenerative disc disease), lumbar    Depression    Descending thoracic  aortic aneurysm [facility])    Dissecting aneurysm of thoracic aorta (HCC)    ESRD (end stage renal disease) (HCC)    dialysis 3 x week, 8 months ago started   ETOH abuse    a. Reported to have quit 05/2011.   Frequent headaches    GERD (gastroesophageal reflux disease)    Headache(784.0)    "q other day" (08/08/2013)   Heart murmur    Hemorrhoid thrombosis    History of echocardiogram    Echo 1/17:  Severe LVH, EF 55-60%, no RWMA, Gr 2 DD, AVR ok, mild to mod MR, mild LAE, mild reduced RVSF, mod RAE   History of medication noncompliance    HYPERLIPIDEMIA     Hypertension    a. Hx of HTN urgency secondary to noncompliance. b. urinary metanephrine and catecholeamine levels normal 2013.  c. Renal art [name] 1/16:  No evidence of renal artery stenosis noted bilaterally.   INGUINAL HERNIA    Pneumonia ~ 2013   Renal insufficiency    Serrated adenoma of colon 08/2015   Stroke [facility])    Tobacco abuse    Valvular heart disease    a. Echo 05/2011: moderate to severe eccentric MR and mild to moderate AI with prolapsing left coronary cusp. b. Echo 01/2012: mild-mod AI, mild dilitation of aortic root, mild MR.;  c. Echo 1/16: Severe LVH consistent with hypertrophic cardio myopathy, EF 50%, no RWMA, mod AI, mild MR, mild RAE, dilated Ao root (40 mm);     Social History   Tobacco Use   Smoking status: Some Days    Current packs/day: 0.25    Average packs/day: 0.3 packs/day for 23.0 years (5.8 ttl pk-yrs)    Types: Cigarettes    Passive exposure: Current   Smokeless tobacco: Never   Tobacco comments:    3 to 4 per day  Substance Use Topics   Alcohol use: Not Currently    Alcohol/week: 1.0 standard drink of alcohol    Types: 1 Cans of beer per week    Comment: On special occasion   Drug use: Yes    Types: Marijuana    Comment: 2 times/week - last use 10/06/20    Family History  Problem Relation Age of Onset   Hypertension Mother    Hypertension Other    Colon cancer Paternal Uncle    Stroke Maternal Aunt    Heart attack Brother    Hypertension Brother    Diabetes Maternal Aunt    Lung cancer Maternal Uncle    Stroke Maternal Uncle    Other Sister        "breathing machine at night"   Headache Sister    Thyroid disease Sister    Throat cancer Neg Hx    Pancreatic cancer Neg Hx    Esophageal cancer Neg Hx    Kidney disease Neg Hx    Liver disease Neg Hx     Allergies  Allergen Reactions   Clonidine Derivatives Nausea And Vomiting and Other (See Comments)    Tablets by mouth = Tolerated  Patches on the skin = Nausea & vomiting after  wearing 4-5 days    Imdur [Isosorbide Nitrate] Other (See Comments)    Headaches -- patient instructed to continue taking    Tramadol Other (See Comments)    Headaches     Physical Exam: Constitutional: in no apparent distress  Vitals:   01/10/23 1245 01/10/23 1531  BP: 134/80 135/79  Pulse: (!) 109 91  Resp: 16  18  Temp: 99.9 F (37.7 C) 99.7 F (37.6 C)  SpO2: 97% 98%   EYES: anicteric ENMT: no thrush Respiratory: normal respiratory effort Musculoskeletal: left arm fistula with no warmth, no erythema  Lab Results  Component Value Date   WBC 8.4 01/10/2023   HGB 10.6 (L) 01/10/2023   HCT 31.7 (L) 01/10/2023   MCV 92.4 01/10/2023   PLT 34 (L) 01/10/2023    Lab Results  Component Value Date   CREATININE 10.64 (H) 01/10/2023   BUN 80 (H) 01/10/2023   NA 134 (L) 01/10/2023   K 3.9 01/10/2023   CL 98 01/10/2023   CO2 20 (L) 01/10/2023    Lab Results  Component Value Date   ALT 19 01/10/2023   AST 32 01/10/2023   ALKPHOS 42 01/10/2023     Microbiology: Recent Results (from the past 240 hour(s))  Blood Culture (routine x 2)     Status: None (Preliminary result)   Collection Time: 01/09/23  3:39 PM   Specimen: BLOOD  Result Value Ref Range Status   Specimen Description   Final    BLOOD BLOOD RIGHT ARM Performed at [facility], [address] [place], [place] [address]    Special Requests   Final    BOTTLES DRAWN AEROBIC AND ANAEROBIC Blood Culture adequate volume Performed at [facility], [address], [place], [place] [address]    Culture  Setup Time   Final    GRAM POSITIVE COCCI IN BOTH AEROBIC AND ANAEROBIC BOTTLES CRITICAL RESULT CALLED TO, READ BACK BY AND VERIFIED WITH: ABE REINE [id number] AT 0858 BY EC Performed at [facility], [address] [place], [place] [address]    Culture GRAM POSITIVE COCCI  Final   Report Status PENDING  Incomplete  Blood Culture (routine x 2)     Status:  None (Preliminary result)   Collection Time: 01/09/23  3:39 PM   Specimen: BLOOD  Result Value Ref Range Status   Specimen Description   Final    BLOOD BLOOD RIGHT ARM Performed at [facility], [address] [place], [place] [address]    Special Requests   Final    BOTTLES DRAWN AEROBIC AND ANAEROBIC Blood Culture results may not be optimal due to an excessive volume of blood received in culture bottles Performed at [facility], [address] [place], [place] [address]    Culture  Setup Time   Final    GRAM POSITIVE COCCI IN BOTH AEROBIC AND ANAEROBIC BOTTLES CRITICAL VALUE NOTED.  VALUE IS CONSISTENT WITH PREVIOUSLY REPORTED AND CALLED VALUE. Performed at [facility], [address], [place], [place] [address]    Culture GRAM POSITIVE COCCI  Final   Report Status PENDING  Incomplete  Resp panel by RT-PCR (RSV, Flu A&B, Covid)     Status: None   Collection Time: 01/09/23  3:39 PM   Specimen: Nasal Swab  Result Value Ref Range Status   SARS Coronavirus 2 by RT PCR NEGATIVE NEGATIVE Final    Comment: (NOTE) SARS-CoV-2 target nucleic acids are NOT DETECTED.  The SARS-CoV-2 RNA is generally detectable in upper respiratory specimens during the acute phase of infection. The lowest concentration of SARS-CoV-2 viral copies this assay can detect is 138 copies/mL. A negative result does not preclude SARS-Cov-2 infection and should not be used as the sole basis for treatment or other patient management decisions. A negative result may occur with  improper specimen collection/handling, submission of specimen other than nasopharyngeal swab, presence of viral mutation(s) within the areas targeted by this assay, and inadequate number of viral copies(<138 copies/mL). A negative result must be combined with clinical observations, patient history, and epidemiological information. The expected result is Negative.  Fact Sheet for Patients:   BloggerCourse.com  Fact Sheet for Healthcare Providers:  SeriousBroker.it  This test is no t yet approved or cleared by the [place] FDA and  has been authorized for detection and/or diagnosis of SARS-CoV-2 by FDA under an Emergency Use Authorization (EUA). This EUA will remain  in effect (meaning this test can be used) for the duration of the COVID-19 declaration under Section 564(b)(1) of the Act, 21 U.S.C.section 360bbb-3(b)(1), unless the authorization is terminated  or revoked sooner.       Influenza A by PCR NEGATIVE NEGATIVE Final   Influenza B by PCR NEGATIVE NEGATIVE Final    Comment: (NOTE) The Xpert Xpress SARS-CoV-2/FLU/RSV plus assay is intended as an aid in the diagnosis of influenza from Nasopharyngeal swab specimens and should not be used as a sole basis for treatment. Nasal washings and aspirates are unacceptable for Xpert Xpress SARS-CoV-2/FLU/RSV testing.  Fact Sheet for Patients: BloggerCourse.com  Fact Sheet for Healthcare Providers: SeriousBroker.it  This test is not yet approved or cleared by the [place] FDA and has been authorized for detection and/or diagnosis of SARS-CoV-2 by FDA under an Emergency Use Authorization (EUA). This EUA will remain in effect (meaning this test can be used) for the duration of the COVID-19 declaration under Section 564(b)(1) of the Act, 21 U.S.C. section 360bbb-3(b)(1), unless the authorization is terminated or revoked.     Resp Syncytial Virus by PCR NEGATIVE NEGATIVE Final    Comment: (NOTE) Fact Sheet for Patients: BloggerCourse.com  Fact Sheet for Healthcare Providers: SeriousBroker.it  This test is not yet approved or cleared by the [place] FDA and has been authorized for detection and/or diagnosis of SARS-CoV-2 by FDA under an Emergency Use  Authorization (EUA). This EUA will remain in effect (meaning this test can be used) for the duration of the COVID-19 declaration under Section 564(b)(1) of the Act, 21 U.S.C. section 360bbb-3(b)(1), unless the authorization is terminated or revoked.  Performed at [facility], [address] [place], [place] [address]   Blood Culture ID Panel (Reflexed)     Status: Abnormal   Collection Time: 01/09/23  3:39 PM  Result Value Ref Range Status   Enterococcus faecalis NOT DETECTED NOT DETECTED Final   Enterococcus Faecium NOT DETECTED NOT DETECTED Final   Listeria monocytogenes NOT DETECTED NOT DETECTED Final   Staphylococcus species DETECTED (A) NOT DETECTED Final    Comment: CRITICAL RESULT CALLED TO, READ BACK BY AND VERIFIED WITH: PHARMD CARLA JARDIN [id number] AT 0858 BY EC    Staphylococcus aureus (BCID) NOT DETECTED NOT DETECTED Final   Staphylococcus epidermidis DETECTED (A) NOT DETECTED Final    Comment: CRITICAL RESULT CALLED TO, READ BACK BY AND VERIFIED WITH: PHARMD CARLA JARDIN [id number] AT 0858 BY EC    Staphylococcus lugdunensis NOT DETECTED NOT DETECTED Final   Streptococcus species NOT DETECTED NOT DETECTED Final   Streptococcus agalactiae NOT DETECTED NOT DETECTED Final   Streptococcus pneumoniae NOT DETECTED NOT DETECTED Final   Streptococcus pyogenes NOT DETECTED NOT DETECTED Final   A.calcoaceticus-baumannii NOT DETECTED NOT DETECTED Final   Bacteroides fragilis NOT DETECTED NOT DETECTED Final   Enterobacterales NOT DETECTED NOT DETECTED Final   Enterobacter cloacae complex  NOT DETECTED NOT DETECTED Final   Escherichia coli NOT DETECTED NOT DETECTED Final   Klebsiella aerogenes NOT DETECTED NOT DETECTED Final   Klebsiella oxytoca NOT DETECTED NOT DETECTED Final   Klebsiella pneumoniae NOT DETECTED NOT DETECTED Final   Proteus species NOT DETECTED NOT DETECTED Final   Salmonella species NOT DETECTED NOT DETECTED Final   Serratia marcescens NOT  DETECTED NOT DETECTED Final   Haemophilus influenzae NOT DETECTED NOT DETECTED Final   Neisseria meningitidis NOT DETECTED NOT DETECTED Final   Pseudomonas aeruginosa NOT DETECTED NOT DETECTED Final   Stenotrophomonas maltophilia NOT DETECTED NOT DETECTED Final   Candida albicans NOT DETECTED NOT DETECTED Final   Candida auris NOT DETECTED NOT DETECTED Final   Candida glabrata NOT DETECTED NOT DETECTED Final   Candida krusei NOT DETECTED NOT DETECTED Final   Candida parapsilosis NOT DETECTED NOT DETECTED Final   Candida tropicalis NOT DETECTED NOT DETECTED Final   Cryptococcus neoformans/gattii NOT DETECTED NOT DETECTED Final   Methicillin resistance mecA/C NOT DETECTED NOT DETECTED Final    Comment: Performed at [facility], [address] [place], [place] [address]    Gardiner Barefoot, MD [facility] [email or web address] 01/10/2023, 3:49 PM

## 2023-01-10 NOTE — Progress Notes (Signed)
Reported elevated triponin of 431 to Dr. Julian Reil.

## 2023-01-10 NOTE — Progress Notes (Addendum)
[facility]  PROGRESS NOTE  [name] GNF:[id number] DOB: [birth year] DOA: 01/09/2023 PCP: Loura Back, NP   Brief HPI:   52 y.o. male with medical history significant of hypertension, hyperlipidemia, CAD s/p CABG,  2 MIs, 3 CVAs, ESRD on hemodialysis once a week, on Tuesdays (patient still forms urine), descending thoracic aortic aneurysm, history of aortic valve replacement with bioprosthetic valve on statin and aspirin daily, severe left jugular venous distention for the past 8 months, tobacco use disorder who presents to the emergency department due to 2-week onset of midsternal chest pain described as pressure-like with radiation to both sides of the neck and bilateral arms.     Assessment/Plan:   Fever/bacteremia -Blood culture growing Staph epidermidis, started on vancomycin -Unclear source -TTE showed no valvular vegetations -Will consult ID for further recommendations  Chest pain Patient has left-sided chest pain with radiation to back -Troponin was elevated to 300, 400 -Cardiology was consulted last night, did not recommend any intervention -spirin 324 mg p.o. x 1 and and nitroglycerin 0.4 mg sublingual x 1 was given. Continue aspirin 81 mg p.o. daily; Patient does not want nitroglycerin -Called and discussed with Dr. Anne Fu, he will see patient today  Aortic valve stenosis -Echocardiogram shows severe stenosis of prosthetic aortic valve -Will follow cardiology recommendations  Acute bronchitis -Patient says he has been coughing up yellow phlegm -Continue DuoNebs, Mucinex, Solu-Medrol  CAD status post CABG -Continue aspirin  Hypertension -Continue amlodipine, hydralazine  Hyperlipidemia -Continue Repatha every 14 days  ESRD, on hemodialysis -Patient gets hemodialyzed once a week, still makes urine  GERD -Continue Protonix    Medications     amLODipine  5 mg Oral Daily   aspirin EC  81 mg Oral Daily   dextromethorphan-guaiFENesin  1 tablet Oral  BID   feeding supplement  237 mL Oral BID BM   hydrALAZINE  50 mg Oral TID   methylPREDNISolone (SOLU-MEDROL) injection  40 mg Intravenous Q12H   pantoprazole  40 mg Oral Daily   sodium chloride flush  10 mL Intravenous Q12H     Data Reviewed:   CBG:  No results for input(s): "GLUCAP" in the last 168 hours.  SpO2: 97 %    Vitals:   01/09/23 2023 01/09/23 2141 01/10/23 0443 01/10/23 0802  BP: 122/74 (!) 141/82 (!) 155/81 (!) 143/78  Pulse: 97 92 (!) 107 90  Resp: (!) 22   17  Temp: 99.2 F (37.3 C) 98.9 F (37.2 C) (!) 102.3 F (39.1 C) 99.1 F (37.3 C)  TempSrc: Oral Oral Oral Oral  SpO2: 98% 100% 97% 97%  Weight:      Height:          Data Reviewed:  Basic Metabolic Panel: Recent Labs  Lab 01/09/23 1539 01/10/23 0608  NA 132* 134*  K 3.7 3.9  CL 96* 98  CO2 22 20*  GLUCOSE 98 128*  BUN 73* 80*  CREATININE 10.07* 10.64*  CALCIUM 9.9 9.4  MG  --  1.8    CBC: Recent Labs  Lab 01/09/23 1539 01/10/23 0608  WBC 6.6 8.4  NEUTROABS 5.7  --   HGB 13.4 10.6*  HCT 40.7 31.7*  MCV 97.6 92.4  PLT 45* 34*    LFT Recent Labs  Lab 01/09/23 1539 01/10/23 0608  AST 39 32  ALT 23 19  ALKPHOS 49 42  BILITOT 1.2 1.0  PROT 8.1 6.2*  ALBUMIN 4.2 3.1*     Antibiotics: Anti-infectives (From admission, onward)  Start     Dose/Rate Route Frequency Ordered Stop   01/10/23 1600  ceFEPIme (MAXIPIME) 1 g in sodium chloride 0.9 % 100 mL IVPB  Status:  Discontinued        1 g 200 mL/hr over 30 Minutes Intravenous Daily 01/09/23 2055 01/10/23 0918   01/10/23 1600  ceFAZolin (ANCEF) IVPB 1 g/50 mL premix        1 g 100 mL/hr over 30 Minutes Intravenous Every 24 hours 01/10/23 0918     01/09/23 2200  vancomycin (VANCOREADY) IVPB 500 mg/100 mL  Status:  Discontinued        500 mg 100 mL/hr over 60 Minutes Intravenous  Once 01/09/23 2055 01/09/23 2220   01/09/23 1515  ceFEPIme (MAXIPIME) 1 g in sodium chloride 0.9 % 100 mL IVPB        1 g 200 mL/hr over 30  Minutes Intravenous  Once 01/09/23 1510 01/09/23 1636   01/09/23 1415  ceFEPIme (MAXIPIME) 2 g in sodium chloride 0.9 % 100 mL IVPB  Status:  Discontinued        2 g 200 mL/hr over 30 Minutes Intravenous  Once 01/09/23 1404 01/09/23 1510   01/09/23 1415  metroNIDAZOLE (FLAGYL) IVPB 500 mg        500 mg 100 mL/hr over 60 Minutes Intravenous  Once 01/09/23 1404 01/09/23 1737   01/09/23 1415  vancomycin (VANCOCIN) IVPB 1000 mg/200 mL premix        1,000 mg 200 mL/hr over 60 Minutes Intravenous  Once 01/09/23 1404 01/09/23 1902        DVT prophylaxis: SCDs, no heparin due to thrombocytopenia  Code Status: Full code  Family Communication: No family at bedside   CONSULTS nephrology   Subjective   Complains of left-sided chest wall pain with radiation to back   Objective    Physical Examination:  General-appears in no acute distress Heart-S1-S2, regular, no murmur auscultated Lungs-clear to auscultation bilaterally, no wheezing or crackles auscultated Abdomen-soft, nontender, no organomegaly Extremities-no edema in the lower extremities Neuro-alert, oriented x3, no focal deficit noted   Status is: Inpatient:             [name]   [facility] If 7PM-7AM, please contact night-coverage at [email or web address], Office  [phone]   01/10/2023, 9:30 AM  LOS: 1 day

## 2023-01-10 NOTE — Plan of Care (Signed)

## 2023-01-10 NOTE — Progress Notes (Signed)
Sent Dr. Julian Reil a secure text to inform him that the telemetry tech called and reported to me new ST elevation in the patient. He order a STAT EKG and consulted with Dr. Orson Aloe in Cardiology.

## 2023-01-10 NOTE — Progress Notes (Signed)
MEWS Progress Note  Patient Details Name: ASHTON SELLITTI MRN: [id number] DOB: 04-26-1970 Today's Date: 01/10/2023   MEWS Flowsheet Documentation:  Assess: MEWS Score Temp: 99.1 F (37.3 C) BP: (!) 143/78 MAP (mmHg): 96 Pulse Rate: 90 ECG Heart Rate: 97 Resp: 17 Level of Consciousness: Alert SpO2: 97 % O2 Device: Room Air Assess: MEWS Score MEWS Temp: 0 MEWS Systolic: 0 MEWS Pulse: 0 MEWS RR: 0 MEWS LOC: 0 MEWS Score: 0 MEWS Score Color: Green Assess: SIRS CRITERIA SIRS Temperature : 0 SIRS Respirations : 0 SIRS Pulse: 0 SIRS WBC: 0 SIRS Score Sum : 0          [name] 01/10/2023, 11:03 AM

## 2023-01-10 NOTE — Progress Notes (Signed)
MEWS Progress Note  Patient Details Name: [name] MRN: [id number] DOB: Dec 27, 1970 Today's Date: 01/10/2023   MEWS Flowsheet Documentation:  Assess: MEWS Score Temp: 99.9 F (37.7 C) BP: 134/80 MAP (mmHg): 97 Pulse Rate: (!) 109 ECG Heart Rate: 97 Resp: 16 Level of Consciousness: Alert SpO2: 97 % O2 Device: Room Air Assess: MEWS Score MEWS Temp: 0 MEWS Systolic: 0 MEWS Pulse: 1 MEWS RR: 0 MEWS LOC: 0 MEWS Score: 1 MEWS Score Color: Green Assess: SIRS CRITERIA SIRS Temperature : 0 SIRS Respirations : 0 SIRS Pulse: 1 SIRS WBC: 0 SIRS Score Sum : 1 SIRS Temperature : 0 SIRS Pulse: 1 SIRS Respirations : 0 SIRS WBC: 0 SIRS Score Sum : 1        [name] 01/10/2023, 1:03 PM

## 2023-01-10 NOTE — Consult Note (Signed)
[name] Renal Consultation Note    Indication for Consultation:  Management of ESRD/hemodialysis, anemia, hypertension/volume, and secondary hyperparathyroidism. PCP:  HPI: [name] is a 52 y.o. male with ESRD, HTN, Hx aortic dissection 2016 s/p AoV replacement and extensive vascular repair, CAD, COPD, Hx CVA who was admitted with fever/bacteremia.  Presented to [facility] ED on 10/26 with chest pain which has been ongoing for 3 weeks, located in mid-chest and radiating to L neck. Also endorsed fatigue, dizziness, occ cough, weight loss, and intermittent fevers. S/p fistulogram with stent placement on 10/11. This was done due to neck swelling - he thinks this has improved. In ED, was febrile and tachycardic. Labs with Na 132, K 3.7, BUN 73, Cr 10, Ca 9.9, BNP 1044, Trop 431 -> 383 ->336, WBC 6.6, Hgb 13.4, Plts 45. COVID/Flu negative. CXR without pneumonia or pulm edema. Blood Cx collected, he was started on broad-spectrum abx, and admitted to [facility].  Seen in room this AM. His blood Cx have turned positive, + Staph Epi on BCID. He underwent TTE this morning, normal EF, stenosis of AoV, no apparent endocarditis. Still having chest pains, no dyspnea at the moment. No abd pain, N/V/D. No LE edema. No wounds or rashes.  From a dialysis standpoint, his prescription is currently for only 2 days/week HD. He hates HD and feels awful during every treatment, was previously only coming sporadically. We collected a 24-hr urine for CrCl on him - measured around 10 (06/2022), so he was changed to just 2 days/week (Tues/Sat), but he almost never comes to the Saturday treatments - effectively runs only 1d/week - Tuesdays - has been doing this for months. Last HD was Tuesday 10/22. Uses L AVF.  Past Medical History:  Diagnosis Date   Adenomatous colon polyp 08/2015   Anxiety    Aortic disease (HCC)    Aortic dissection (HCC)    a. admx 04/2014 >> L renal infarct; a/c renal failure >> b.  s/p  Bioprosthetic Bentall and total arch replacement and staged endovascular repair of descending aortic aneurysm ([facility] - Dr. Kizzie Bane)   CAD (coronary artery disease)    a. LHC 4/16:  oD1 60%   Cardiomyopathy (HCC)    a. non-ischemic - probably related to untreated HTN and ETOH abuse - Echo 3/13 with EF 35-40% >> b. Echo 4/16: Severe LVH, EF 55-60%, moderate AI, moderate MR, mild LAE, trivial effusion, known type B dissection with communication between true and false lumens with suprasternal images suggesting dissection plane may propagate to at least left subclavian takeoff, root above aortic valve okay     Chronic abdominal pain    Chronic combined systolic and diastolic congestive heart failure (HCC)    a. 05/2011: Adm with pulm edema/HTN urgency, EF 35-40% with diffuse hypokinesis and moderate to severe mitral regurgitation. Cardiomyopathy likely due to uncontrolled HTN and ETOH abuse - cath deferred due to renal insufficiency (felt due to uncontrolled HTN). bElzie Rings MV 06/2011: EF 37% and no ischemia or infarction. c. EF 45-50% by echo 01/2012.   Chronic sinusitis    CKD (chronic kidney disease)    dialysis m,t,th,fri   DDD (degenerative disc disease), lumbar    Depression    Descending thoracic aortic aneurysm (HCC)    Dissecting aneurysm of thoracic aorta (HCC)    ESRD (end stage renal disease) (HCC)    dialysis 3 x week, 8 months ago started   ETOH abuse    a. Reported to have quit 05/2011.  Frequent headaches    GERD (gastroesophageal reflux disease)    Headache(784.0)    "q other day" (08/08/2013)   Heart murmur    Hemorrhoid thrombosis    History of echocardiogram    Echo 1/17:  Severe LVH, EF 55-60%, no RWMA, Gr 2 DD, AVR ok, mild to mod MR, mild LAE, mild reduced RVSF, mod RAE   History of medication noncompliance    HYPERLIPIDEMIA    Hypertension    a. Hx of HTN urgency secondary to noncompliance. b. urinary metanephrine and catecholeamine levels normal 2013.  c. Renal art [name]  1/16:  No evidence of renal artery stenosis noted bilaterally.   INGUINAL HERNIA    Pneumonia ~ 2013   Renal insufficiency    Serrated adenoma of colon 08/2015   Stroke [facility])    Tobacco abuse    Valvular heart disease    a. Echo 05/2011: moderate to severe eccentric MR and mild to moderate AI with prolapsing left coronary cusp. b. Echo 01/2012: mild-mod AI, mild dilitation of aortic root, mild MR.;  c. Echo 1/16: Severe LVH consistent with hypertrophic cardio myopathy, EF 50%, no RWMA, mod AI, mild MR, mild RAE, dilated Ao root (40 mm);    Past Surgical History:  Procedure Laterality Date   A/V FISTULAGRAM Left 03/07/2021   Procedure: A/V FISTULAGRAM;  Surgeon: Chuck Hint, MD;  Location: [facility] INVASIVE CV LAB;  Service: Cardiovascular;  Laterality: Left;   A/V FISTULAGRAM Left 07/18/2021   Procedure: A/V Fistulagram;  Surgeon: Chuck Hint, MD;  Location: [facility] INVASIVE CV LAB;  Service: Cardiovascular;  Laterality: Left;   A/V FISTULAGRAM Left 12/25/2022   Procedure: A/V Fistulagram;  Surgeon: Leonie Douglas, MD;  Location: [facility] INVASIVE CV LAB;  Service: Cardiovascular;  Laterality: Left;   ANKLE SURGERY Bilateral    Fractures bilaterally   AORTIC VALVE SURGERY  09/2014   AV FISTULA PLACEMENT Left 09/09/2020   Procedure: LEFT ARM ARTERIOVENOUS (AV) FISTULA CREATION;  Surgeon: Leonie Douglas, MD;  Location: [facility] OR;  Service: Vascular;  Laterality: Left;   BASCILIC VEIN TRANSPOSITION Left 10/09/2020   Procedure: LEFT SECOND STAGE BASILIC VEIN TRANSPOSITION;  Surgeon: Leonie Douglas, MD;  Location: [facility] OR;  Service: Vascular;  Laterality: Left;  PERIPHERAL NERVE BLOCK   CORONARY ANGIOGRAPHY N/A 12/06/2017   Procedure: CORONARY ANGIOGRAPHY;  Surgeon: Lyn Records, MD;  Location: [facility] INVASIVE CV LAB;  Service: Cardiovascular;  Laterality: N/A;   CORONARY ANGIOGRAPHY N/A 09/26/2018   Procedure: CORONARY ANGIOGRAPHY;  Surgeon: Yvonne Kendall, MD;  Location: [facility] INVASIVE CV LAB;   Service: Cardiovascular;  Laterality: N/A;   CORONARY STENT INTERVENTION N/A 12/10/2017   Procedure: CORONARY STENT INTERVENTION;  Surgeon: Lennette Bihari, MD;  Location: [facility] INVASIVE CV LAB;  Service: Cardiovascular;  Laterality: N/A;   FOOT FRACTURE SURGERY Bilateral 2004-2010   "got pins in both of them"   HEMORRHOID SURGERY N/A 06/15/2015   Procedure: HEMORRHOIDECTOMY;  Surgeon: Almond Lint, MD;  Location: [facility] OR;  Service: General;  Laterality: N/A;   INGUINAL HERNIA REPAIR Right ~ 1996   INSERTION OF DIALYSIS CATHETER N/A 09/09/2020   Procedure: INSERTION OF TUNNELED DIALYSIS PALINDROME 19cm CATHETER;  Surgeon: Leonie Douglas, MD;  Location: [facility] OR;  Service: Vascular;  Laterality: N/A;   LEFT HEART CATH AND CORONARY ANGIOGRAPHY N/A 02/05/2021   Procedure: LEFT HEART CATH AND CORONARY ANGIOGRAPHY;  Surgeon: Laurey Morale, MD;  Location: [facility] INVASIVE CV LAB;  Service: Cardiovascular;  Laterality: N/A;  LEFT HEART CATHETERIZATION WITH CORONARY ANGIOGRAM N/A 06/21/2014   Procedure: LEFT HEART CATHETERIZATION WITH CORONARY ANGIOGRAM;  Surgeon: Laurey Morale, MD;  Location: [facility] CATH LAB;  Service: Cardiovascular;  Laterality: N/A;   LOOP RECORDER INSERTION N/A 06/19/2016   Procedure: Loop Recorder Insertion;  Surgeon: [facility] Salvia, MD;  Location: [facility] INVASIVE CV LAB;  Service: Cardiovascular;  Laterality: N/A;   PERIPHERAL VASCULAR BALLOON ANGIOPLASTY  03/07/2021   Procedure: PERIPHERAL VASCULAR BALLOON ANGIOPLASTY;  Surgeon: Chuck Hint, MD;  Location: [facility] INVASIVE CV LAB;  Service: Cardiovascular;;   PERIPHERAL VASCULAR BALLOON ANGIOPLASTY  07/18/2021   Procedure: PERIPHERAL VASCULAR BALLOON ANGIOPLASTY;  Surgeon: Chuck Hint, MD;  Location: [facility] INVASIVE CV LAB;  Service: Cardiovascular;;   PERIPHERAL VASCULAR INTERVENTION Left 12/25/2022   Procedure: PERIPHERAL VASCULAR INTERVENTION;  Surgeon: Leonie Douglas, MD;  Location: [facility] INVASIVE CV LAB;  Service: Cardiovascular;   Laterality: Left;  stent to brachiocephalic vein   PERIPHERAL VASCULAR ULTRASOUND/IVUS Left 12/25/2022   Procedure: Peripheral Vascular Ultrasound/IVUS;  Surgeon: Leonie Douglas, MD;  Location: [facility] INVASIVE CV LAB;  Service: Cardiovascular;  Laterality: Left;   TEE WITHOUT CARDIOVERSION N/A 03/12/2016   Procedure: TRANSESOPHAGEAL ECHOCARDIOGRAM (TEE);  Surgeon: Thurmon Fair, MD;  Location: [facility] ENDOSCOPY;  Service: Cardiovascular;  Laterality: N/A;   TEE WITHOUT CARDIOVERSION N/A 10/16/2020   Procedure: TRANSESOPHAGEAL ECHOCARDIOGRAM (TEE);  Surgeon: Laurey Morale, MD;  Location: [facility] ENDOSCOPY;  Service: Cardiovascular;  Laterality: N/A;   UPPER EXTREMITY VENOGRAPHY Right 07/18/2021   Procedure: UPPER EXTREMITY VENOGRAPHY;  Surgeon: Chuck Hint, MD;  Location: [facility] INVASIVE CV LAB;  Service: Cardiovascular;  Laterality: Right;   Family History  Problem Relation Age of Onset   Hypertension Mother    Hypertension Other    Colon cancer Paternal Uncle    Stroke Maternal Aunt    Heart attack Brother    Hypertension Brother    Diabetes Maternal Aunt    Lung cancer Maternal Uncle    Stroke Maternal Uncle    Other Sister        "breathing machine at night"   Headache Sister    Thyroid disease Sister    Throat cancer Neg Hx    Pancreatic cancer Neg Hx    Esophageal cancer Neg Hx    Kidney disease Neg Hx    Liver disease Neg Hx    Social History:  reports that he has been smoking cigarettes. He has a 5.8 pack-year smoking history. He has been exposed to tobacco smoke. He has never used smokeless tobacco. He reports that he does not currently use alcohol after a past usage of about 1.0 standard drink of alcohol per week. He reports current drug use. Drug: Marijuana.  ROS: As per HPI otherwise negative.  Physical Exam: Vitals:   01/09/23 2023 01/09/23 2141 01/10/23 0443 01/10/23 0802  BP: 122/74 (!) 141/82 (!) 155/81 (!) 143/78  Pulse: 97 92 (!) 107 90  Resp: (!) 22   17  Temp:  99.2 F (37.3 C) 98.9 F (37.2 C) (!) 102.3 F (39.1 C) 99.1 F (37.3 C)  TempSrc: Oral Oral Oral Oral  SpO2: 98% 100% 97% 97%  Weight:      Height:         General: Well developed, well nourished, in no acute distress. Room air. Head: Normocephalic, atraumatic, sclera non-icteric, mucus membranes are moist. Neck: Supple without lymphadenopathy/masses. JVD not elevated. Lungs: Clear bilaterally to auscultation without wheezes, rales, or rhonchi. Breathing is unlabored. Heart: Tachycardic  with 5/6 systolic murmur with click Abdomen: Soft, non-tender, non-distended with normoactive bowel sounds. Musculoskeletal:  Strength and tone appear normal for age. Lower extremities: No edema or ischemic changes, no open wounds. Neuro: Alert and oriented X 3. Moves all extremities spontaneously. Psych:  Responds to questions appropriately with a normal affect. Dialysis Access: L AVF + bruit  Allergies  Allergen Reactions   Clonidine Derivatives Nausea And Vomiting and Other (See Comments)    Tablets by mouth = Tolerated  Patches on the skin = Nausea & vomiting after wearing 4-5 days    Imdur [Isosorbide Nitrate] Other (See Comments)    Headaches -- patient instructed to continue taking    Tramadol Other (See Comments)    Headaches    Prior to Admission medications   Medication Sig Start Date End Date Taking? Authorizing Provider  albuterol (VENTOLIN HFA) 108 (90 Base) MCG/ACT inhaler Inhale 1 puff into the lungs every 6 (six) hours as needed for wheezing or shortness of breath. 05/24/21  Yes [provider]  amLODipine (NORVASC) 10 MG tablet TAKE 1 TABLET(10 MG) BY MOUTH DAILY Patient taking differently: Take 10 mg by mouth daily. 07/15/21  Yes Laurey Morale, MD  aspirin EC 81 MG EC tablet Take 1 tablet (81 mg total) by mouth daily. 12/12/17  Yes [facility], Roe Rutherford, PA  Evolocumab (REPATHA SURECLICK) 140 MG/ML SOAJ Inject 140 mg into the skin every 14 (fourteen) days. 10/28/22  Yes  Hilty, Lisette Abu, MD  hydrALAZINE (APRESOLINE) 100 MG tablet Take 1 tablet (100 mg total) by mouth 3 (three) times daily. Patient taking differently: Take 50 mg by mouth 3 (three) times daily. 11/06/21  Yes Laurey Morale, MD  HYDROcodone-acetaminophen (NORCO/VICODIN) 5-325 MG tablet Take 1 tablet by mouth every 6 (six) hours as needed for moderate pain. 12/21/22  Yes Vanetta Mulders, MD  lidocaine-prilocaine (EMLA) cream Apply 1 application. topically as needed (prior to port being accessed). Use on dialysis days (Tues, Thurs and Sat)   Yes [provider]  meclizine (ANTIVERT) 25 MG tablet Take 1 tablet (25 mg total) by mouth 3 (three) times daily as needed for dizziness. 12/21/22  Yes Vanetta Mulders, MD  pantoprazole (PROTONIX) 40 MG tablet Take 40 mg by mouth daily.   Yes [provider]  STIOLTO RESPIMAT 2.5-2.5 MCG/ACT AERS Inhale 2 puffs into the lungs at bedtime. 12/29/22  Yes [provider]  cloNIDine (CATAPRES) 0.1 MG tablet Take 1 tablet (0.1 mg total) by mouth 2 (two) times daily. Patient not taking: Reported on 01/09/2023 10/08/22   Tonye Becket D, NP  clopidogrel (PLAVIX) 75 MG tablet Take 1 tablet (75 mg total) by mouth daily. Patient not taking: Reported on 01/09/2023 12/21/22   Vanetta Mulders, MD  oxyCODONE-acetaminophen (PERCOCET) 5-325 MG tablet Take 1 tablet by mouth every 4 (four) hours as needed for severe pain. Patient not taking: Reported on 01/09/2023 12/25/22 12/25/23  Leonie Douglas, MD  sildenafil (VIAGRA) 50 MG tablet Take 1 tablet (50 mg total) by mouth daily as needed for erectile dysfunction. Patient not taking: Reported on 01/09/2023 03/30/22   Laurey Morale, MD   Current Facility-Administered Medications  Medication Dose Route Frequency Provider Last Rate Last Admin   acetaminophen (TYLENOL) tablet 650 mg  650 mg Oral Q6H PRN Adefeso, Oladapo, DO   650 mg at 01/10/23 0453   albuterol (PROVENTIL) (2.5 MG/3ML) 0.083% nebulizer  solution 2.5 mg  2.5 mg Inhalation Q6H PRN Frankey Shown, DO  amLODipine (NORVASC) tablet 5 mg  5 mg Oral Daily Adefeso, Oladapo, DO   5 mg at 01/10/23 1036   aspirin EC tablet 81 mg  81 mg Oral Daily Adefeso, Oladapo, DO   81 mg at 01/10/23 1036   ceFAZolin (ANCEF) IVPB 1 g/50 mL premix  1 g Intravenous Q24H [name], MD       dextromethorphan-guaiFENesin [facility] DM) 30-600 MG per 12 hr tablet 1 tablet  1 tablet Oral BID Adefeso, Oladapo, DO   1 tablet at 01/10/23 1036   feeding supplement (ENSURE ENLIVE / ENSURE PLUS) liquid 237 mL  237 mL Oral BID BM Adefeso, Oladapo, DO   237 mL at 01/10/23 1036   fentaNYL (SUBLIMAZE) injection 50 mcg  50 mcg Intravenous Q2H PRN Hillary Bow, DO   50 mcg at 01/10/23 4401   hydrALAZINE (APRESOLINE) tablet 50 mg  50 mg Oral TID Adefeso, Oladapo, DO   50 mg at 01/10/23 1036   ipratropium-albuterol (DUONEB) 0.5-2.5 (3) MG/3ML nebulizer solution 3 mL  3 mL Nebulization Q4H PRN Adefeso, Oladapo, DO       methylPREDNISolone sodium succinate (SOLU-MEDROL) 40 mg/mL injection 40 mg  40 mg Intravenous Q12H Adefeso, Oladapo, DO   40 mg at 01/10/23 1036   ondansetron (ZOFRAN) tablet 4 mg  4 mg Oral Q6H PRN Adefeso, Oladapo, DO       Or   ondansetron (ZOFRAN) injection 4 mg  4 mg Intravenous Q6H PRN Adefeso, Oladapo, DO       pantoprazole (PROTONIX) EC tablet 40 mg  40 mg Oral Daily Adefeso, Oladapo, DO   40 mg at 01/10/23 1036   sodium chloride flush (NS) 0.9 % injection 10 mL  10 mL Intravenous Q12H Adefeso, Oladapo, DO   10 mL at 01/10/23 1036   Labs: Basic Metabolic Panel: Recent Labs  Lab 01/09/23 1539 01/10/23 0608  NA 132* 134*  K 3.7 3.9  CL 96* 98  CO2 22 20*  GLUCOSE 98 128*  BUN 73* 80*  CREATININE 10.07* 10.64*  CALCIUM 9.9 9.4   Liver Function Tests: Recent Labs  Lab 01/09/23 1539 01/10/23 0608  AST 39 32  ALT 23 19  ALKPHOS 49 42  BILITOT 1.2 1.0  PROT 8.1 6.2*  ALBUMIN 4.2 3.1*   CBC: Recent Labs  Lab 01/09/23 1539  01/10/23 0608  WBC 6.6 8.4  NEUTROABS 5.7  --   HGB 13.4 10.6*  HCT 40.7 31.7*  MCV 97.6 92.4  PLT 45* 34*   Studies/Results: ECHOCARDIOGRAM COMPLETE  Result Date: 01/10/2023    ECHOCARDIOGRAM REPORT   Patient Name:   KHAMAL JUSTINIANO Date of Exam: 01/10/2023 Medical Rec #:  [id number]     Height:       71.0 in Accession #:    [id number]    Weight:       132.0 lb Date of Birth:  [birth year]      BSA:          1.767 m Patient Age:    52 years      BP:           143/78 mmHg Patient Gender: M             HR:           86 bpm. Exam Location:  Inpatient Procedure: 2D Echo, Cardiac Doppler and Color Doppler Indications:     CHF-Acute Diastolic I50.31  History:         Patient has prior  history of Echocardiogram examinations, most                  recent 04/15/2022. Cardiomyopathy, CAD and Previous Myocardial                  Infarction, ETOH abuse, Stroke and COPD, Aortic Valve Disease                  and Mitral Valve Disease; Risk Factors:Current Smoker,                  Hypertension and Dyslipidemia. Aortic dissection repair.  Sonographer:     Dondra Prader RVT RCS Referring Phys:  [id number] [facility] ADEFESO Diagnosing Phys: Lennie Odor MD IMPRESSIONS  1. Left ventricular ejection fraction, by estimation, is 55 to 60%. The left ventricle has normal function. The left ventricle has no regional wall motion abnormalities. There is severe concentric left ventricular hypertrophy. Left ventricular diastolic  parameters are consistent with Grade II diastolic dysfunction (pseudonormalization).  2. Right ventricular systolic function is normal. The right ventricular size is normal. Tricuspid regurgitation signal is inadequate for assessing PA pressure.  3. Left atrial size was severely dilated.  4. Right atrial size was mild to moderately dilated.  5. The mitral valve is grossly normal. Moderate mitral valve regurgitation. No evidence of mitral stenosis.  6. Bioprosthetic AoV. Vmax 3.5 m/s, MG 31 mmHG, EOA 0.90cm2, DI 0.26,  AT 120 msec. 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 The aortic valve has been repaired/replaced. Aortic valve regurgitation is not visualized. Severe aortic valve stenosis. Procedure Date: 11/2014. Echo findings are consistent with stenosis of the aortic prosthesis. Aortic valve acceleration time measures 120 msec.  7. Aortic root repair measures 38 mm. Aortic root/ascending aorta has been repaired/replaced.  8. The inferior vena cava is normal in size with greater than 50% respiratory variability, suggesting right atrial pressure of 3 mmHg. Comparison(s): Changes from prior study are noted. Aortic prosthetic valve with concerns for severe stenosis on this study. FINDINGS  Left Ventricle: Left ventricular ejection fraction, by estimation, is 55 to 60%. The left ventricle has normal function. The left ventricle has no regional wall motion abnormalities. The left ventricular internal cavity size was normal in size. There is  severe concentric left ventricular hypertrophy. Abnormal (paradoxical) septal motion consistent with post-operative status. Left ventricular diastolic parameters are consistent with Grade II diastolic dysfunction (pseudonormalization). Right Ventricle: The right ventricular size is normal. No increase in right ventricular wall thickness. Right ventricular systolic function is normal. Tricuspid regurgitation signal is inadequate for assessing PA pressure. Left Atrium: Left atrial size was severely dilated. Right Atrium: Right atrial size was mild to moderately dilated. Pericardium: There is no evidence of pericardial effusion. Mitral Valve: The mitral valve is grossly normal. Moderate mitral valve regurgitation. No evidence of mitral valve stenosis. Tricuspid Valve: The tricuspid valve is grossly normal. Tricuspid valve regurgitation is mild . No evidence of tricuspid stenosis. Aortic Valve: Bioprosthetic AoV. Vmax 3.5 m/s, MG 31 mmHG, EOA 0.90cm2, DI 0.26, AT 120 msec.  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 The aortic valve has been repaired/replaced. Aortic valve regurgitation is not visualized. Severe aortic stenosis is present. Aortic valve mean gradient measures 31.2 mmHg. Aortic valve peak gradient measures 50.6 mmHg. Aortic valve area, by VTI measures 0.90 cm. There is a unknown bioprosthetic valve present in the aortic position. Pulmonic Valve: The pulmonic valve was grossly normal. Pulmonic valve regurgitation is not visualized. No evidence  of pulmonic stenosis. Aorta: Aortic root repair measures 38 mm. The aortic root/ascending aorta has been repaired/replaced. Venous: The inferior vena cava is normal in size with greater than 50% respiratory variability, suggesting right atrial pressure of 3 mmHg. IAS/Shunts: The atrial septum is grossly normal.  LEFT VENTRICLE PLAX 2D LVIDd:         5.10 cm   Diastology LVIDs:         3.50 cm   LV e' medial:    5.98 cm/s LV PW:         1.90 cm   LV E/e' medial:  13.5 LV IVS:        1.70 cm   LV e' lateral:   7.51 cm/s LVOT diam:     2.10 cm   LV E/e' lateral: 10.7 LV SV:         69 LV SV Index:   39 LVOT Area:     3.46 cm  RIGHT VENTRICLE             IVC RV Basal diam:  3.20 cm     IVC diam: 1.00 cm RV S prime:     12.90 cm/s TAPSE (M-mode): 2.3 cm LEFT ATRIUM              Index        RIGHT ATRIUM           Index LA Vol (A2C):   114.0 ml 64.51 ml/m  RA Area:     22.40 cm LA Vol (A4C):   89.7 ml  50.76 ml/m  RA Volume:   69.20 ml  39.16 ml/m LA Biplane Vol: 117.0 ml 66.21 ml/m  AORTIC VALVE                     PULMONIC VALVE AV Area (Vmax):    1.13 cm      PR End Diast Vel: 6.15 msec AV Area (Vmean):   1.12 cm AV Area (VTI):     0.90 cm AV Vmax:           355.73 cm/s AV Vmean:          238.367 cm/s AV VTI:            0.764 m AV Peak Grad:      50.6 mmHg AV Mean Grad:      31.2 mmHg LVOT Vmax:         116.00 cm/s LVOT Vmean:        76.900 cm/s LVOT VTI:          0.199 m LVOT/AV VTI ratio: 0.26  AORTA  Ao Root diam: 3.80 cm MITRAL VALVE MV Area (PHT): 3.99 cm    SHUNTS MV Decel Time: 190 msec    Systemic VTI:  0.20 m MV E velocity: 80.50 cm/s  Systemic Diam: 2.10 cm MV A velocity: 77.20 cm/s MV E/A ratio:  1.04 Lennie Odor MD Electronically signed by Lennie Odor MD Signature Date/Time: 01/10/2023/10:15:25 AM    Final (Updated)    CT CHEST ABDOMEN PELVIS WO CONTRAST  Result Date: 01/09/2023 CLINICAL DATA:  Cough, weight loss. EXAM: CT CHEST, ABDOMEN AND PELVIS WITHOUT CONTRAST TECHNIQUE: Multidetector CT imaging of the chest, abdomen and pelvis was performed following the standard protocol without IV contrast. RADIATION DOSE REDUCTION: This exam was performed according to the departmental dose-optimization program which includes automated exposure control, adjustment of the mA and/or kV according to patient size and/or use of iterative reconstruction technique.  COMPARISON:  04/27/2022 FINDINGS: CT CHEST FINDINGS Cardiovascular: Interval left brachiocephalic vein stenting traversing the previously narrowed segment. Aortic stent again noted along with prosthetic aortic valve. Postoperative findings from debranching. Left anterior descending, right, and circumflex coronary artery atheromatous vascular calcification. Today's noncontrast exam does not assess patency. Mild cardiomegaly. Mediastinum/Nodes: Unremarkable Lungs/Pleura: Mild centrilobular emphysema. Mild scarring in the left lower lobe and lingula. Airway thickening is present, suggesting bronchitis or reactive airways disease. Musculoskeletal: Prior median sternotomy. Paucity of adipose tissues. CT ABDOMEN PELVIS FINDINGS Hepatobiliary: Stable 1 cm hypodense lesion in the right hepatic lobe on image 59 series 12, probably a cyst. Similar small stable hypodense lesion in the left hepatic lobe measures 4 mm in diameter on image 11 series 2. No further imaging workup of these lesions is indicated. Small gallstones in the gallbladder as on image 39  series 2. Pancreas: Slightly indistinct margins due to paucity of abdominal adipose tissue. No specific abnormality observed. Spleen: Unremarkable Adrenals/Urinary Tract: Stable moderate atrophy of the left kidney mild atrophy of the right kidney. Adrenal glands unremarkable. Urinary bladder unremarkable. Stomach/Bowel: Paucity of intra-adipose tissue and lack of oral and IV contrast makes separation of bowel loops problematic. No dilated bowel. Vascular/Lymphatic: Calcifications and postoperative findings along the surgical bypass grafts of the celiac trunk, SMA, and renal arteries, roughly similar contours 2 the 04/27/2022 exam, and with re-demonstration the 3.8 cm density just below the operative site which may reflect a postoperative fluid collection. Infrarenal abdominal aortic aneurysm 3.2 cm in diameter on image 41 of series 2, similar to previous. Left iliac artery caliber stable at 3.3 cm, right common iliac artery caliber stable at 2.1 cm. Reproductive: Unremarkable Other: No supplemental non-categorized findings. Musculoskeletal: Unremarkable IMPRESSION: 1. A definite cause for the patient's weight loss is not identified on today's noncontrast exam. 2. Airway thickening is present, suggesting bronchitis or reactive airways disease. This may be contributing to the patient's cough. 3. Mild centrilobular emphysema. 4. Interval left brachiocephalic vein stenting traversing the previously narrowed segment. 5. Stable contours of postoperative findings in the thoracic aorta with debranching, and generally stable appearance of the abdominal systemic arterial bypasses. Chronic mildly complex postoperative fluid collection below the postoperative site in the abdominal aorta. 6. Stable 3.2 cm infrarenal abdominal aortic aneurysm. Typical recommendation would be follow-up ultrasound every 3 years, but given the complexity of the postoperative vascular scenario in the chest and abdomen, more frequent surveillance may  be indicated as per vascular surgical surveillance protocols. 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 7. Stable moderate atrophy of the left kidney and mild atrophy of the right kidney. 8. Cholelithiasis. 9. Paucity of intra-adipose tissue and lack of oral and IV contrast makes separation of bowel loops difficult. Aortic Atherosclerosis (ICD10-I70.0) and Emphysema (ICD10-J43.9). Electronically Signed   By: Gaylyn Rong M.D.   On: 01/09/2023 15:41   CT Head Wo Contrast  Result Date: 01/09/2023 CLINICAL DATA:  Headaches, increasing frequency or severity. Headache and bilateral arm pain following carotid artery stenting. EXAM: CT HEAD WITHOUT CONTRAST TECHNIQUE: Contiguous axial images were obtained from the base of the skull through the vertex without intravenous contrast. RADIATION DOSE REDUCTION: This exam was performed according to the departmental dose-optimization program which includes automated exposure control, adjustment of the mA and/or kV according to patient size and/or use of iterative reconstruction technique. 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 No acute infarct, hemorrhage, or  mass lesion is present. No significant white matter lesions are present. Deep brain nuclei are within normal limits. The ventricles are of normal size. No significant extraaxial fluid collection is present. Midline structures are within normal limits. The brainstem and cerebellum are within normal limits. Vascular: Extensive atherosclerotic calcifications are present in the cavernous internal carotid arteries bilaterally. Calcifications are present in left vertebral artery. 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 Skull: Calvarium is intact. No focal lytic or blastic lesions are present. No significant extracranial soft tissue lesion is present. Sinuses/Orbits: The  paranasal sinuses and mastoid air cells are clear. The globes and orbits are within normal limits. IMPRESSION: 1. No acute intracranial abnormality or significant interval change. 2. 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 3. 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 Electronically Signed   By: Marin Roberts M.D.   On: 01/09/2023 15:32   DG Chest Port 1 View  Result Date: 01/09/2023 CLINICAL DATA:  Questionable sepsis - evaluate for abnormality EXAM: PORTABLE CHEST 1 VIEW COMPARISON:  December 25, 2022 FINDINGS: The cardiomediastinal silhouette is unchanged in contour.Status post endovascular repair of the aorta. Status post median sternotomy. Cardiac loop recorder. No pleural effusion. No pneumothorax. No acute pleuroparenchymal abnormality. Surgical clips project over the RIGHT axilla. IMPRESSION: No acute cardiopulmonary abnormality. Electronically Signed   By: Meda Klinefelter M.D.   On: 01/09/2023 14:39    Dialysis Orders:  Tues/Sat - [facility] - last HD 10/22, he only comes for HD on Tuesdays - has been counseled extensively. 4hr, 500/800, EDW 63.7kg, 2K/2Ca, UFP #4, L AVF, UFP #4, heparin 3000 bolus - Mircera Iv q 4 weeks (last 10/15) - Calcitriol PO q HD - Venofer 50mg  IV weekly (tsat 24% on 10/15)  Assessment/Plan:  Fever/Bacteremia: Blood Cx 10/26 growing GPC. On broad spectrum abx - Vanc/Cefepime/Flagyl. Of note, he essentially only comes for HD 1d/week so cannot plan for 3d/week antibiotic course - consider engaging ID for recommendations.  Chest pain: For 3 weeks - troponin elevated, but stable. Per primary.  ESRD:  Usual sched Tues/Sat - he almost never comes to HD on Saturdays. Last HD 10/22. Volume wise, he appears ok, K/CO2 ok. Plan on HD Tuesday unless labs/symptoms change.  Hypertension/volume: BP up slightly, continue home meds. Has been losing weight, below prior EDW.  Anemia: Hgb 10.6 -  follow without ESA for now.  Metabolic bone disease: Ca ok, Phos pending.   Nutrition:  Alb 3.1 - adding protein supplements.  Weight loss: ?infection, ?low grade uremia  Thrombocytopenia  Ozzie Hoyle, PA-C 01/10/2023, 11:36 AM  [facility]

## 2023-01-11 ENCOUNTER — Encounter (HOSPITAL_COMMUNITY): Payer: Self-pay | Admitting: Internal Medicine

## 2023-01-11 ENCOUNTER — Inpatient Hospital Stay (HOSPITAL_COMMUNITY): Payer: 59

## 2023-01-11 DIAGNOSIS — E43 Unspecified severe protein-calorie malnutrition: Secondary | ICD-10-CM | POA: Insufficient documentation

## 2023-01-11 DIAGNOSIS — R7989 Other specified abnormal findings of blood chemistry: Secondary | ICD-10-CM | POA: Diagnosis not present

## 2023-01-11 DIAGNOSIS — R509 Fever, unspecified: Secondary | ICD-10-CM | POA: Diagnosis not present

## 2023-01-11 DIAGNOSIS — R6510 Systemic inflammatory response syndrome (SIRS) of non-infectious origin without acute organ dysfunction: Secondary | ICD-10-CM | POA: Diagnosis not present

## 2023-01-11 DIAGNOSIS — R079 Chest pain, unspecified: Secondary | ICD-10-CM | POA: Diagnosis not present

## 2023-01-11 LAB — URINALYSIS, W/ REFLEX TO CULTURE (INFECTION SUSPECTED)
Bilirubin Urine: NEGATIVE
Glucose, UA: 50 mg/dL — AB
Ketones, ur: NEGATIVE mg/dL
Leukocytes,Ua: NEGATIVE
Nitrite: NEGATIVE
Protein, ur: >=300 mg/dL — AB
Specific Gravity, Urine: 1.017 (ref 1.005–1.030)
pH: 5 (ref 5.0–8.0)

## 2023-01-11 LAB — HEPATITIS B SURFACE ANTIGEN: Hepatitis B Surface Ag: NONREACTIVE

## 2023-01-11 LAB — PHOSPHORUS: Phosphorus: 4.8 mg/dL — ABNORMAL HIGH (ref 2.5–4.6)

## 2023-01-11 MED ORDER — RENA-VITE PO TABS
1.0000 | ORAL_TABLET | Freq: Every day | ORAL | Status: DC
  Administered 2023-01-11 – 2023-01-13 (×3): 1 via ORAL
  Filled 2023-01-11 (×3): qty 1

## 2023-01-11 MED ORDER — CALCITRIOL 0.5 MCG PO CAPS
2.0000 ug | ORAL_CAPSULE | ORAL | Status: DC
Start: 2023-01-12 — End: 2023-01-14
  Administered 2023-01-12: 2 ug via ORAL
  Filled 2023-01-11 (×2): qty 4

## 2023-01-11 MED ORDER — CHLORHEXIDINE GLUCONATE CLOTH 2 % EX PADS
6.0000 | MEDICATED_PAD | Freq: Every day | CUTANEOUS | Status: DC
  Administered 2023-01-12 – 2023-01-13 (×2): 6 via TOPICAL

## 2023-01-11 NOTE — Consult Note (Addendum)
RBC 4.17* 3.43*  HGB 13.4 10.6*  HCT 40.7 31.7*  MCV 97.6 92.4  MCH 32.1 30.9  MCHC 32.9 33.4  RDW 13.6 13.5  PLT 45* 34*   Thyroid No results for input(s): "TSH", "FREET4" in the last 168 hours.  BNP Recent Labs  Lab 01/09/23 1539  BNP 1,044.9*    DDimer No results for input(s): "DDIMER" in the last 168 hours.   Radiology/Studies:  ECHOCARDIOGRAM COMPLETE  Result Date: 01/10/2023    ECHOCARDIOGRAM REPORT   Patient Name:   [name] Date of Exam: 01/10/2023 Medical Rec #:  [id number]     Height:       71.0 in Accession #:    [id number]    Weight:       132.0 lb Date of Birth:  [birth year]      BSA:          1.767 m Patient Age:    52 years      BP:           143/78 mmHg Patient Gender: M             HR:           86 bpm. Exam Location:  Inpatient Procedure: 2D Echo, Cardiac Doppler and Color Doppler Indications:     CHF-Acute Diastolic I50.31  History:         Patient has prior history of Echocardiogram examinations, most                  recent 04/15/2022. Cardiomyopathy, CAD and Previous Myocardial                  Infarction, ETOH abuse, Stroke and COPD, Aortic Valve Disease                  and Mitral Valve Disease; Risk Factors:Current Smoker,                  Hypertension and Dyslipidemia. Aortic dissection repair.  Sonographer:     Dondra Prader RVT RCS Referring Phys:  [id number] [facility] Diagnosing Phys: Lennie Odor MD IMPRESSIONS  1. Left ventricular ejection fraction, by estimation, is 55 to 60%. The left ventricle has normal function. The left ventricle has no regional wall motion abnormalities. There is severe concentric left ventricular hypertrophy. Left ventricular diastolic  parameters are consistent with Grade II diastolic dysfunction (pseudonormalization).  2. Right ventricular systolic function is normal. The right ventricular size is normal. Tricuspid regurgitation signal is inadequate for assessing PA pressure.  3. Left atrial size was severely dilated.  4. Right atrial size was mild to moderately dilated.  5. The mitral valve is grossly normal. Moderate mitral valve regurgitation. No evidence of mitral stenosis.  6. Bioprosthetic AoV. Vmax 3.5 m/s, MG 31 mmHG, EOA 0.90cm2, DI 0.26, AT 120 msec. 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 The aortic valve has been repaired/replaced. Aortic valve regurgitation is not visualized. Severe aortic valve stenosis. Procedure Date: 11/2014. Echo findings are consistent with stenosis of the aortic prosthesis. Aortic valve acceleration time measures 120 msec.  7. Aortic root repair measures 38 mm. Aortic root/ascending aorta has been repaired/replaced.  8. The inferior vena cava is normal in size with greater than 50% respiratory variability, suggesting right atrial pressure of 3 mmHg. Comparison(s): Changes from prior study are noted. Aortic prosthetic valve with concerns for severe stenosis on this study. FINDINGS  Left Ventricle: Left ventricular ejection fraction, by estimation, is  Cardiology Consultation   Patient ID: [name] MRN: [id number]; DOB: [birth year]  Admit date: 01/09/2023 Date of Consult: 01/11/2023  PCP:  Andre Back, NP   [facility] Providers Cardiologist:  Marca Ancona, MD  Advanced Heart Failure:  Marca Ancona, MD  {  Patient Profile:   [name] is a 52 y.o. 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 Sharl Ma.  History of Present Illness:   [name] has a longstanding history of nonischemic cardiomyopathy with chronic systolic heart failure felt secondary to hypertension and alcohol use.  He was admitted 04/2014 with hypertensive emergency found to have a type B aortic dissection just distal to the left subclavian.  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  Heart catheterization April 2016 showed 60% D1 lesion.  He has had significant intervention on ascending aorta and arch with AVR in 2016 with a staged endovascular repair of descending aortic aneurysm.  He has had intermittent chest discomfort since 2016.  He was admitted 11/2017 with chest pain found to have an NSTEMI.  LHC resulted in PCI/DES to proximal ramus.  Repeat heart catheterization 09/2018 showed nonobstructive disease.  TEE was performed 10/2020 that showed good AVR function and preserved LVEF 60-65%.  He is followed by the advanced heart failure clinic and was last seen 10/08/2022 with chronic SBP in the 1 70-200 range.  He was admitted 10/5 - 12/20/2022 with vertigo.  Neurology was consulted and there was concern for acute vascular vestibular vertigo.  DAPT  x 3 months followed by aspirin alone was recommended.  CTA of head and neck showed significant vascular abnormalities with severely distended left JVD.  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  He subsequently underwent left arm fistulogram, left brachiocephalic arteriovenous angioplasty and stenting on 12/25/2022 with Dr. Lenell Antu.  He is now admitted with chest pain and fever of unknown origin found to have bacteremia and elevated troponin. Chest pain x 3 weeks worse with deep inspiration.   Nephrology consulted - pt is noncompliant only coming to HD once per week stating that he's proven this regimen works.   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  Pt reports constant chest pain of 10/10 for three weeks since his brachiocephalic AV angioplasty. History is a bit difficult, but he states CP has been constant, but later states worse with deep inspiration. Other notes suggest he developed a persistent cough 3 weeks ago that precipitated chest pain. He has had pain radiating down both arms, lost appetite and subsequently has lost 20 lbs. Prior to 3 weeks ago, he was able to walk daily without exertional chest pain. He states his pain has been relieved over the last 24 hrs.   He is overall frustrated at his care this admission.    Past Medical History:  Diagnosis Date   Adenomatous colon polyp 08/2015   Anxiety    Aortic disease (HCC)    Aortic dissection (HCC)    a. admx 04/2014 >> L renal infarct; a/c renal failure >> b.  s/p Bioprosthetic Bentall and total arch  Cardiology Consultation   Patient ID: [name] MRN: [id number]; DOB: [birth year]  Admit date: 01/09/2023 Date of Consult: 01/11/2023  PCP:  Andre Back, NP   [facility] Providers Cardiologist:  Marca Ancona, MD  Advanced Heart Failure:  Marca Ancona, MD  {  Patient Profile:   [name] is a 52 y.o. 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 Sharl Ma.  History of Present Illness:   [name] has a longstanding history of nonischemic cardiomyopathy with chronic systolic heart failure felt secondary to hypertension and alcohol use.  He was admitted 04/2014 with hypertensive emergency found to have a type B aortic dissection just distal to the left subclavian.  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  Heart catheterization April 2016 showed 60% D1 lesion.  He has had significant intervention on ascending aorta and arch with AVR in 2016 with a staged endovascular repair of descending aortic aneurysm.  He has had intermittent chest discomfort since 2016.  He was admitted 11/2017 with chest pain found to have an NSTEMI.  LHC resulted in PCI/DES to proximal ramus.  Repeat heart catheterization 09/2018 showed nonobstructive disease.  TEE was performed 10/2020 that showed good AVR function and preserved LVEF 60-65%.  He is followed by the advanced heart failure clinic and was last seen 10/08/2022 with chronic SBP in the 1 70-200 range.  He was admitted 10/5 - 12/20/2022 with vertigo.  Neurology was consulted and there was concern for acute vascular vestibular vertigo.  DAPT  x 3 months followed by aspirin alone was recommended.  CTA of head and neck showed significant vascular abnormalities with severely distended left JVD.  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  He subsequently underwent left arm fistulogram, left brachiocephalic arteriovenous angioplasty and stenting on 12/25/2022 with Dr. Lenell Antu.  He is now admitted with chest pain and fever of unknown origin found to have bacteremia and elevated troponin. Chest pain x 3 weeks worse with deep inspiration.   Nephrology consulted - pt is noncompliant only coming to HD once per week stating that he's proven this regimen works.   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  Pt reports constant chest pain of 10/10 for three weeks since his brachiocephalic AV angioplasty. History is a bit difficult, but he states CP has been constant, but later states worse with deep inspiration. Other notes suggest he developed a persistent cough 3 weeks ago that precipitated chest pain. He has had pain radiating down both arms, lost appetite and subsequently has lost 20 lbs. Prior to 3 weeks ago, he was able to walk daily without exertional chest pain. He states his pain has been relieved over the last 24 hrs.   He is overall frustrated at his care this admission.    Past Medical History:  Diagnosis Date   Adenomatous colon polyp 08/2015   Anxiety    Aortic disease (HCC)    Aortic dissection (HCC)    a. admx 04/2014 >> L renal infarct; a/c renal failure >> b.  s/p Bioprosthetic Bentall and total arch  Cardiology Consultation   Patient ID: [name] MRN: [id number]; DOB: [birth year]  Admit date: 01/09/2023 Date of Consult: 01/11/2023  PCP:  Andre Back, NP   [facility] Providers Cardiologist:  Marca Ancona, MD  Advanced Heart Failure:  Marca Ancona, MD  {  Patient Profile:   [name] is a 52 y.o. 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 Sharl Ma.  History of Present Illness:   [name] has a longstanding history of nonischemic cardiomyopathy with chronic systolic heart failure felt secondary to hypertension and alcohol use.  He was admitted 04/2014 with hypertensive emergency found to have a type B aortic dissection just distal to the left subclavian.  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  Heart catheterization April 2016 showed 60% D1 lesion.  He has had significant intervention on ascending aorta and arch with AVR in 2016 with a staged endovascular repair of descending aortic aneurysm.  He has had intermittent chest discomfort since 2016.  He was admitted 11/2017 with chest pain found to have an NSTEMI.  LHC resulted in PCI/DES to proximal ramus.  Repeat heart catheterization 09/2018 showed nonobstructive disease.  TEE was performed 10/2020 that showed good AVR function and preserved LVEF 60-65%.  He is followed by the advanced heart failure clinic and was last seen 10/08/2022 with chronic SBP in the 1 70-200 range.  He was admitted 10/5 - 12/20/2022 with vertigo.  Neurology was consulted and there was concern for acute vascular vestibular vertigo.  DAPT  x 3 months followed by aspirin alone was recommended.  CTA of head and neck showed significant vascular abnormalities with severely distended left JVD.  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  He subsequently underwent left arm fistulogram, left brachiocephalic arteriovenous angioplasty and stenting on 12/25/2022 with Dr. Lenell Antu.  He is now admitted with chest pain and fever of unknown origin found to have bacteremia and elevated troponin. Chest pain x 3 weeks worse with deep inspiration.   Nephrology consulted - pt is noncompliant only coming to HD once per week stating that he's proven this regimen works.   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  Pt reports constant chest pain of 10/10 for three weeks since his brachiocephalic AV angioplasty. History is a bit difficult, but he states CP has been constant, but later states worse with deep inspiration. Other notes suggest he developed a persistent cough 3 weeks ago that precipitated chest pain. He has had pain radiating down both arms, lost appetite and subsequently has lost 20 lbs. Prior to 3 weeks ago, he was able to walk daily without exertional chest pain. He states his pain has been relieved over the last 24 hrs.   He is overall frustrated at his care this admission.    Past Medical History:  Diagnosis Date   Adenomatous colon polyp 08/2015   Anxiety    Aortic disease (HCC)    Aortic dissection (HCC)    a. admx 04/2014 >> L renal infarct; a/c renal failure >> b.  s/p Bioprosthetic Bentall and total arch  Cardiology Consultation   Patient ID: [name] MRN: [id number]; DOB: [birth year]  Admit date: 01/09/2023 Date of Consult: 01/11/2023  PCP:  Andre Back, NP   [facility] Providers Cardiologist:  Marca Ancona, MD  Advanced Heart Failure:  Marca Ancona, MD  {  Patient Profile:   [name] is a 52 y.o. 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 Sharl Ma.  History of Present Illness:   [name] has a longstanding history of nonischemic cardiomyopathy with chronic systolic heart failure felt secondary to hypertension and alcohol use.  He was admitted 04/2014 with hypertensive emergency found to have a type B aortic dissection just distal to the left subclavian.  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  Heart catheterization April 2016 showed 60% D1 lesion.  He has had significant intervention on ascending aorta and arch with AVR in 2016 with a staged endovascular repair of descending aortic aneurysm.  He has had intermittent chest discomfort since 2016.  He was admitted 11/2017 with chest pain found to have an NSTEMI.  LHC resulted in PCI/DES to proximal ramus.  Repeat heart catheterization 09/2018 showed nonobstructive disease.  TEE was performed 10/2020 that showed good AVR function and preserved LVEF 60-65%.  He is followed by the advanced heart failure clinic and was last seen 10/08/2022 with chronic SBP in the 1 70-200 range.  He was admitted 10/5 - 12/20/2022 with vertigo.  Neurology was consulted and there was concern for acute vascular vestibular vertigo.  DAPT  x 3 months followed by aspirin alone was recommended.  CTA of head and neck showed significant vascular abnormalities with severely distended left JVD.  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  He subsequently underwent left arm fistulogram, left brachiocephalic arteriovenous angioplasty and stenting on 12/25/2022 with Dr. Lenell Antu.  He is now admitted with chest pain and fever of unknown origin found to have bacteremia and elevated troponin. Chest pain x 3 weeks worse with deep inspiration.   Nephrology consulted - pt is noncompliant only coming to HD once per week stating that he's proven this regimen works.   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  Pt reports constant chest pain of 10/10 for three weeks since his brachiocephalic AV angioplasty. History is a bit difficult, but he states CP has been constant, but later states worse with deep inspiration. Other notes suggest he developed a persistent cough 3 weeks ago that precipitated chest pain. He has had pain radiating down both arms, lost appetite and subsequently has lost 20 lbs. Prior to 3 weeks ago, he was able to walk daily without exertional chest pain. He states his pain has been relieved over the last 24 hrs.   He is overall frustrated at his care this admission.    Past Medical History:  Diagnosis Date   Adenomatous colon polyp 08/2015   Anxiety    Aortic disease (HCC)    Aortic dissection (HCC)    a. admx 04/2014 >> L renal infarct; a/c renal failure >> b.  s/p Bioprosthetic Bentall and total arch  RBC 4.17* 3.43*  HGB 13.4 10.6*  HCT 40.7 31.7*  MCV 97.6 92.4  MCH 32.1 30.9  MCHC 32.9 33.4  RDW 13.6 13.5  PLT 45* 34*   Thyroid No results for input(s): "TSH", "FREET4" in the last 168 hours.  BNP Recent Labs  Lab 01/09/23 1539  BNP 1,044.9*    DDimer No results for input(s): "DDIMER" in the last 168 hours.   Radiology/Studies:  ECHOCARDIOGRAM COMPLETE  Result Date: 01/10/2023    ECHOCARDIOGRAM REPORT   Patient Name:   [name] Date of Exam: 01/10/2023 Medical Rec #:  [id number]     Height:       71.0 in Accession #:    [id number]    Weight:       132.0 lb Date of Birth:  [birth year]      BSA:          1.767 m Patient Age:    52 years      BP:           143/78 mmHg Patient Gender: M             HR:           86 bpm. Exam Location:  Inpatient Procedure: 2D Echo, Cardiac Doppler and Color Doppler Indications:     CHF-Acute Diastolic I50.31  History:         Patient has prior history of Echocardiogram examinations, most                  recent 04/15/2022. Cardiomyopathy, CAD and Previous Myocardial                  Infarction, ETOH abuse, Stroke and COPD, Aortic Valve Disease                  and Mitral Valve Disease; Risk Factors:Current Smoker,                  Hypertension and Dyslipidemia. Aortic dissection repair.  Sonographer:     Dondra Prader RVT RCS Referring Phys:  [id number] [facility] Diagnosing Phys: Lennie Odor MD IMPRESSIONS  1. Left ventricular ejection fraction, by estimation, is 55 to 60%. The left ventricle has normal function. The left ventricle has no regional wall motion abnormalities. There is severe concentric left ventricular hypertrophy. Left ventricular diastolic  parameters are consistent with Grade II diastolic dysfunction (pseudonormalization).  2. Right ventricular systolic function is normal. The right ventricular size is normal. Tricuspid regurgitation signal is inadequate for assessing PA pressure.  3. Left atrial size was severely dilated.  4. Right atrial size was mild to moderately dilated.  5. The mitral valve is grossly normal. Moderate mitral valve regurgitation. No evidence of mitral stenosis.  6. Bioprosthetic AoV. Vmax 3.5 m/s, MG 31 mmHG, EOA 0.90cm2, DI 0.26, AT 120 msec. 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 The aortic valve has been repaired/replaced. Aortic valve regurgitation is not visualized. Severe aortic valve stenosis. Procedure Date: 11/2014. Echo findings are consistent with stenosis of the aortic prosthesis. Aortic valve acceleration time measures 120 msec.  7. Aortic root repair measures 38 mm. Aortic root/ascending aorta has been repaired/replaced.  8. The inferior vena cava is normal in size with greater than 50% respiratory variability, suggesting right atrial pressure of 3 mmHg. Comparison(s): Changes from prior study are noted. Aortic prosthetic valve with concerns for severe stenosis on this study. FINDINGS  Left Ventricle: Left ventricular ejection fraction, by estimation, is  RBC 4.17* 3.43*  HGB 13.4 10.6*  HCT 40.7 31.7*  MCV 97.6 92.4  MCH 32.1 30.9  MCHC 32.9 33.4  RDW 13.6 13.5  PLT 45* 34*   Thyroid No results for input(s): "TSH", "FREET4" in the last 168 hours.  BNP Recent Labs  Lab 01/09/23 1539  BNP 1,044.9*    DDimer No results for input(s): "DDIMER" in the last 168 hours.   Radiology/Studies:  ECHOCARDIOGRAM COMPLETE  Result Date: 01/10/2023    ECHOCARDIOGRAM REPORT   Patient Name:   [name] Date of Exam: 01/10/2023 Medical Rec #:  [id number]     Height:       71.0 in Accession #:    [id number]    Weight:       132.0 lb Date of Birth:  [birth year]      BSA:          1.767 m Patient Age:    52 years      BP:           143/78 mmHg Patient Gender: M             HR:           86 bpm. Exam Location:  Inpatient Procedure: 2D Echo, Cardiac Doppler and Color Doppler Indications:     CHF-Acute Diastolic I50.31  History:         Patient has prior history of Echocardiogram examinations, most                  recent 04/15/2022. Cardiomyopathy, CAD and Previous Myocardial                  Infarction, ETOH abuse, Stroke and COPD, Aortic Valve Disease                  and Mitral Valve Disease; Risk Factors:Current Smoker,                  Hypertension and Dyslipidemia. Aortic dissection repair.  Sonographer:     Dondra Prader RVT RCS Referring Phys:  [id number] [facility] Diagnosing Phys: Lennie Odor MD IMPRESSIONS  1. Left ventricular ejection fraction, by estimation, is 55 to 60%. The left ventricle has normal function. The left ventricle has no regional wall motion abnormalities. There is severe concentric left ventricular hypertrophy. Left ventricular diastolic  parameters are consistent with Grade II diastolic dysfunction (pseudonormalization).  2. Right ventricular systolic function is normal. The right ventricular size is normal. Tricuspid regurgitation signal is inadequate for assessing PA pressure.  3. Left atrial size was severely dilated.  4. Right atrial size was mild to moderately dilated.  5. The mitral valve is grossly normal. Moderate mitral valve regurgitation. No evidence of mitral stenosis.  6. Bioprosthetic AoV. Vmax 3.5 m/s, MG 31 mmHG, EOA 0.90cm2, DI 0.26, AT 120 msec. 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 The aortic valve has been repaired/replaced. Aortic valve regurgitation is not visualized. Severe aortic valve stenosis. Procedure Date: 11/2014. Echo findings are consistent with stenosis of the aortic prosthesis. Aortic valve acceleration time measures 120 msec.  7. Aortic root repair measures 38 mm. Aortic root/ascending aorta has been repaired/replaced.  8. The inferior vena cava is normal in size with greater than 50% respiratory variability, suggesting right atrial pressure of 3 mmHg. Comparison(s): Changes from prior study are noted. Aortic prosthetic valve with concerns for severe stenosis on this study. FINDINGS  Left Ventricle: Left ventricular ejection fraction, by estimation, is  Cardiology Consultation   Patient ID: [name] MRN: [id number]; DOB: [birth year]  Admit date: 01/09/2023 Date of Consult: 01/11/2023  PCP:  Andre Back, NP   [facility] Providers Cardiologist:  Marca Ancona, MD  Advanced Heart Failure:  Marca Ancona, MD  {  Patient Profile:   [name] is a 52 y.o. 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 Sharl Ma.  History of Present Illness:   [name] has a longstanding history of nonischemic cardiomyopathy with chronic systolic heart failure felt secondary to hypertension and alcohol use.  He was admitted 04/2014 with hypertensive emergency found to have a type B aortic dissection just distal to the left subclavian.  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  Heart catheterization April 2016 showed 60% D1 lesion.  He has had significant intervention on ascending aorta and arch with AVR in 2016 with a staged endovascular repair of descending aortic aneurysm.  He has had intermittent chest discomfort since 2016.  He was admitted 11/2017 with chest pain found to have an NSTEMI.  LHC resulted in PCI/DES to proximal ramus.  Repeat heart catheterization 09/2018 showed nonobstructive disease.  TEE was performed 10/2020 that showed good AVR function and preserved LVEF 60-65%.  He is followed by the advanced heart failure clinic and was last seen 10/08/2022 with chronic SBP in the 1 70-200 range.  He was admitted 10/5 - 12/20/2022 with vertigo.  Neurology was consulted and there was concern for acute vascular vestibular vertigo.  DAPT  x 3 months followed by aspirin alone was recommended.  CTA of head and neck showed significant vascular abnormalities with severely distended left JVD.  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  He subsequently underwent left arm fistulogram, left brachiocephalic arteriovenous angioplasty and stenting on 12/25/2022 with Dr. Lenell Antu.  He is now admitted with chest pain and fever of unknown origin found to have bacteremia and elevated troponin. Chest pain x 3 weeks worse with deep inspiration.   Nephrology consulted - pt is noncompliant only coming to HD once per week stating that he's proven this regimen works.   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  Pt reports constant chest pain of 10/10 for three weeks since his brachiocephalic AV angioplasty. History is a bit difficult, but he states CP has been constant, but later states worse with deep inspiration. Other notes suggest he developed a persistent cough 3 weeks ago that precipitated chest pain. He has had pain radiating down both arms, lost appetite and subsequently has lost 20 lbs. Prior to 3 weeks ago, he was able to walk daily without exertional chest pain. He states his pain has been relieved over the last 24 hrs.   He is overall frustrated at his care this admission.    Past Medical History:  Diagnosis Date   Adenomatous colon polyp 08/2015   Anxiety    Aortic disease (HCC)    Aortic dissection (HCC)    a. admx 04/2014 >> L renal infarct; a/c renal failure >> b.  s/p Bioprosthetic Bentall and total arch  RBC 4.17* 3.43*  HGB 13.4 10.6*  HCT 40.7 31.7*  MCV 97.6 92.4  MCH 32.1 30.9  MCHC 32.9 33.4  RDW 13.6 13.5  PLT 45* 34*   Thyroid No results for input(s): "TSH", "FREET4" in the last 168 hours.  BNP Recent Labs  Lab 01/09/23 1539  BNP 1,044.9*    DDimer No results for input(s): "DDIMER" in the last 168 hours.   Radiology/Studies:  ECHOCARDIOGRAM COMPLETE  Result Date: 01/10/2023    ECHOCARDIOGRAM REPORT   Patient Name:   [name] Date of Exam: 01/10/2023 Medical Rec #:  [id number]     Height:       71.0 in Accession #:    [id number]    Weight:       132.0 lb Date of Birth:  [birth year]      BSA:          1.767 m Patient Age:    52 years      BP:           143/78 mmHg Patient Gender: M             HR:           86 bpm. Exam Location:  Inpatient Procedure: 2D Echo, Cardiac Doppler and Color Doppler Indications:     CHF-Acute Diastolic I50.31  History:         Patient has prior history of Echocardiogram examinations, most                  recent 04/15/2022. Cardiomyopathy, CAD and Previous Myocardial                  Infarction, ETOH abuse, Stroke and COPD, Aortic Valve Disease                  and Mitral Valve Disease; Risk Factors:Current Smoker,                  Hypertension and Dyslipidemia. Aortic dissection repair.  Sonographer:     Dondra Prader RVT RCS Referring Phys:  [id number] [facility] ADEF[facility] Diagnosing Phys: Lennie Odor MD IMPRESSIONS  1. Left ventricular ejection fraction, by estimation, is 55 to 60%. The left ventricle has normal function. The left ventricle has no regional wall motion abnormalities. There is severe concentric left ventricular hypertrophy. Left ventricular diastolic  parameters are consistent with Grade II diastolic dysfunction (pseudonormalization).  2. Right ventricular systolic function is normal. The right ventricular size is normal. Tricuspid regurgitation signal is inadequate for assessing PA pressure.  3. Left atrial size was severely dilated.  4. Right atrial size was mild to moderately dilated.  5. The mitral valve is grossly normal. Moderate mitral valve regurgitation. No evidence of mitral stenosis.  6. Bioprosthetic AoV. Vmax 3.5 m/s, MG 31 mmHG, EOA 0.90cm2, DI 0.26, AT 120 msec. 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 The aortic valve has been repaired/replaced. Aortic valve regurgitation is not visualized. Severe aortic valve stenosis. Procedure Date: 11/2014. Echo findings are consistent with stenosis of the aortic prosthesis. Aortic valve acceleration time measures 120 msec.  7. Aortic root repair measures 38 mm. Aortic root/ascending aorta has been repaired/replaced.  8. The inferior vena cava is normal in size with greater than 50% respiratory variability, suggesting right atrial pressure of 3 mmHg. Comparison(s): Changes from prior study are noted. Aortic prosthetic valve with concerns for severe stenosis on this study. FINDINGS  Left Ventricle: Left ventricular ejection fraction, by estimation, is  RBC 4.17* 3.43*  HGB 13.4 10.6*  HCT 40.7 31.7*  MCV 97.6 92.4  MCH 32.1 30.9  MCHC 32.9 33.4  RDW 13.6 13.5  PLT 45* 34*   Thyroid No results for input(s): "TSH", "FREET4" in the last 168 hours.  BNP Recent Labs  Lab 01/09/23 1539  BNP 1,044.9*    DDimer No results for input(s): "DDIMER" in the last 168 hours.   Radiology/Studies:  ECHOCARDIOGRAM COMPLETE  Result Date: 01/10/2023    ECHOCARDIOGRAM REPORT   Patient Name:   [name] Date of Exam: 01/10/2023 Medical Rec #:  [id number]     Height:       71.0 in Accession #:    [id number]    Weight:       132.0 lb Date of Birth:  [birth year]      BSA:          1.767 m Patient Age:    52 years      BP:           143/78 mmHg Patient Gender: M             HR:           86 bpm. Exam Location:  Inpatient Procedure: 2D Echo, Cardiac Doppler and Color Doppler Indications:     CHF-Acute Diastolic I50.31  History:         Patient has prior history of Echocardiogram examinations, most                  recent 04/15/2022. Cardiomyopathy, CAD and Previous Myocardial                  Infarction, ETOH abuse, Stroke and COPD, Aortic Valve Disease                  and Mitral Valve Disease; Risk Factors:Current Smoker,                  Hypertension and Dyslipidemia. Aortic dissection repair.  Sonographer:     Dondra Prader RVT RCS Referring Phys:  [id number] [facility] Diagnosing Phys: Lennie Odor MD IMPRESSIONS  1. Left ventricular ejection fraction, by estimation, is 55 to 60%. The left ventricle has normal function. The left ventricle has no regional wall motion abnormalities. There is severe concentric left ventricular hypertrophy. Left ventricular diastolic  parameters are consistent with Grade II diastolic dysfunction (pseudonormalization).  2. Right ventricular systolic function is normal. The right ventricular size is normal. Tricuspid regurgitation signal is inadequate for assessing PA pressure.  3. Left atrial size was severely dilated.  4. Right atrial size was mild to moderately dilated.  5. The mitral valve is grossly normal. Moderate mitral valve regurgitation. No evidence of mitral stenosis.  6. Bioprosthetic AoV. Vmax 3.5 m/s, MG 31 mmHG, EOA 0.90cm2, DI 0.26, AT 120 msec. 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 The aortic valve has been repaired/replaced. Aortic valve regurgitation is not visualized. Severe aortic valve stenosis. Procedure Date: 11/2014. Echo findings are consistent with stenosis of the aortic prosthesis. Aortic valve acceleration time measures 120 msec.  7. Aortic root repair measures 38 mm. Aortic root/ascending aorta has been repaired/replaced.  8. The inferior vena cava is normal in size with greater than 50% respiratory variability, suggesting right atrial pressure of 3 mmHg. Comparison(s): Changes from prior study are noted. Aortic prosthetic valve with concerns for severe stenosis on this study. FINDINGS  Left Ventricle: Left ventricular ejection fraction, by estimation, is  RBC 4.17* 3.43*  HGB 13.4 10.6*  HCT 40.7 31.7*  MCV 97.6 92.4  MCH 32.1 30.9  MCHC 32.9 33.4  RDW 13.6 13.5  PLT 45* 34*   Thyroid No results for input(s): "TSH", "FREET4" in the last 168 hours.  BNP Recent Labs  Lab 01/09/23 1539  BNP 1,044.9*    DDimer No results for input(s): "DDIMER" in the last 168 hours.   Radiology/Studies:  ECHOCARDIOGRAM COMPLETE  Result Date: 01/10/2023    ECHOCARDIOGRAM REPORT   Patient Name:   [name] Date of Exam: 01/10/2023 Medical Rec #:  [id number]     Height:       71.0 in Accession #:    [id number]    Weight:       132.0 lb Date of Birth:  [birth year]      BSA:          1.767 m Patient Age:    52 years      BP:           143/78 mmHg Patient Gender: M             HR:           86 bpm. Exam Location:  Inpatient Procedure: 2D Echo, Cardiac Doppler and Color Doppler Indications:     CHF-Acute Diastolic I50.31  History:         Patient has prior history of Echocardiogram examinations, most                  recent 04/15/2022. Cardiomyopathy, CAD and Previous Myocardial                  Infarction, ETOH abuse, Stroke and COPD, Aortic Valve Disease                  and Mitral Valve Disease; Risk Factors:Current Smoker,                  Hypertension and Dyslipidemia. Aortic dissection repair.  Sonographer:     Dondra Prader RVT RCS Referring Phys:  [id number] [facility] Diagnosing Phys: Lennie Odor MD IMPRESSIONS  1. Left ventricular ejection fraction, by estimation, is 55 to 60%. The left ventricle has normal function. The left ventricle has no regional wall motion abnormalities. There is severe concentric left ventricular hypertrophy. Left ventricular diastolic  parameters are consistent with Grade II diastolic dysfunction (pseudonormalization).  2. Right ventricular systolic function is normal. The right ventricular size is normal. Tricuspid regurgitation signal is inadequate for assessing PA pressure.  3. Left atrial size was severely dilated.  4. Right atrial size was mild to moderately dilated.  5. The mitral valve is grossly normal. Moderate mitral valve regurgitation. No evidence of mitral stenosis.  6. Bioprosthetic AoV. Vmax 3.5 m/s, MG 31 mmHG, EOA 0.90cm2, DI 0.26, AT 120 msec. 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 The aortic valve has been repaired/replaced. Aortic valve regurgitation is not visualized. Severe aortic valve stenosis. Procedure Date: 11/2014. Echo findings are consistent with stenosis of the aortic prosthesis. Aortic valve acceleration time measures 120 msec.  7. Aortic root repair measures 38 mm. Aortic root/ascending aorta has been repaired/replaced.  8. The inferior vena cava is normal in size with greater than 50% respiratory variability, suggesting right atrial pressure of 3 mmHg. Comparison(s): Changes from prior study are noted. Aortic prosthetic valve with concerns for severe stenosis on this study. FINDINGS  Left Ventricle: Left ventricular ejection fraction, by estimation, is

## 2023-01-11 NOTE — Plan of Care (Signed)

## 2023-01-11 NOTE — Progress Notes (Signed)
Initial Nutrition Assessment  DOCUMENTATION CODES:   Severe malnutrition in context of chronic illness, Underweight  INTERVENTION:  Liberalized diet Snack BID Carnation instant breakfast.  Multivitamin  Ensure BID   NUTRITION DIAGNOSIS:   Severe Malnutrition related to chronic illness as evidenced by severe fat depletion, severe muscle depletion.    GOAL:   Patient will meet greater than or equal to 90% of their needs    MONITOR:   PO intake, Skin, Supplement acceptance, Labs, Weight trends  REASON FOR ASSESSMENT:   Consult Assessment of nutrition requirement/status  ASSESSMENT:    52 y.o. M, admitted with acute febrile illness. Pmh; anxiety, CAD, ESRD, DDD, depression, GERD, ETOH abuse, HLD, Stroke.  Weight loss noted. Currently scheduled for 2 day a week dialysis although he reports that he only goes 1 day a week and skips the second day. Last HD was Tuesday 10/22. ~13% weight loss x 20 days.   RN in room at time of visit. Pt stated that he was hungry. He did not get breakfast this morning because he missed order time. Stated that I could put him down for order assistance and he was agreeable with this. Stated he got an ensure last night and really enjoyed it.  He states weight and appetite loss. Patient would benefit from daily therapeutic multivitamin as he is not receiving at this time. There is no benefit to excessive dietary restrictions related advanced age, and severe malnutrition . Patient would benefit better from liberalized diet to help meet increased needs and promote better oral intake.  Discussed snacks and carnation instant breakfast.  Will add Carnation to tray. Continue with ensure, Snack BID.   Admit weight: 59.9 kg Current weight: 59.9 kg Weight history:   01/09/23 59.9 kg  12/20/22 69.1 kg  12/18/22 65.9 kg  10/08/22 66.4 kg  04/27/22 70.8 kg  03/30/22 69.3 kg  03/01/22 75.8 kg     Average Meal Intake No current documentation.    Nutritionally Relevant Medications: Scheduled Meds:  amLODipine  5 mg Oral Daily   aspirin EC  81 mg Oral Daily   feeding supplement  237 mL Oral BID BM   hydrALAZINE  50 mg Oral TID    Labs Reviewed    NUTRITION - FOCUSED PHYSICAL EXAM:  Flowsheet Row Most Recent Value  Orbital Region Moderate depletion  Upper Arm Region Severe depletion  Thoracic and Lumbar Region Severe depletion  Buccal Region Moderate depletion  Temple Region Moderate depletion  Clavicle Bone Region Severe depletion  Clavicle and Acromion Bone Region Severe depletion  Scapular Bone Region Unable to assess  Dorsal Hand Moderate depletion  Patellar Region Severe depletion  Anterior Thigh Region Severe depletion  Posterior Calf Region Severe depletion  Edema (RD Assessment) None  Hair Reviewed  Eyes Reviewed  Mouth Reviewed  Skin Reviewed  Nails Reviewed       Diet Order:   Diet Order             Diet Heart Room service appropriate? Yes; Fluid consistency: Thin  Diet effective now                   EDUCATION NEEDS:   Education needs have been addressed  Skin:  Skin Assessment: Reviewed RN Assessment  Last BM:  PTA  Height:   Ht Readings from Last 1 Encounters:  01/09/23 5\' 11"  (1.803 m)    Weight:   Wt Readings from Last 1 Encounters:  01/09/23 59.9 kg    Ideal  Body Weight:     BMI:  Body mass index is 18.41 kg/m.  Estimated Nutritional Needs:   Kcal:  1800-2100 kcal/d  Protein:  80-90 g/d  Fluid:  1500-1800 ml/d    Jamelle Haring RDN, LDN Clinical Dietitian  RDN pager # available on Amion

## 2023-01-11 NOTE — Progress Notes (Signed)
[facility]  PROGRESS NOTE  [name] OAC:[id number] DOB: Jul 29, 1970 DOA: 01/09/2023 PCP: Loura Back, NP   Brief HPI:   52 y.o. male with medical history significant of hypertension, hyperlipidemia, CAD s/p CABG,  2 MIs, 3 CVAs, ESRD on hemodialysis once a week, on Tuesdays (patient still forms urine), descending thoracic aortic aneurysm, history of aortic valve replacement with bioprosthetic valve on statin and aspirin daily, severe left jugular venous distention for the past 8 months, tobacco use disorder who presents to the emergency department due to 2-week onset of midsternal chest pain described as pressure-like with radiation to both sides of the neck and bilateral arms.     Assessment/Plan:   Fever/bacteremia -Blood culture growing Staph epidermidis, started on vancomycin -ID changed antibiotics to cefazolin -Unclear source -TTE showed no valvular vegetations -ID consulted, recommend TEE -Will call cardiology for further recommendations  Chest pain Patient has left-sided chest pain with radiation to back -Troponin was elevated to 300, 400 -Cardiology was consulted last night, did not recommend any intervention -spirin 324 mg p.o. x 1 and and nitroglycerin 0.4 mg sublingual x 1 was given. Continue aspirin 81 mg p.o. daily; Patient does not want nitroglycerin -Cardiology consulted  Aortic valve stenosis -Echocardiogram shows severe stenosis of prosthetic aortic valve -Will follow cardiology recommendations  Acute bronchitis -Patient says he has been coughing up yellow phlegm -Continue DuoNebs, Mucinex, Solu-Medrol  CAD status post CABG -Continue aspirin  Hypertension -Continue amlodipine, hydralazine  Hyperlipidemia -Continue Repatha every 14 days  ESRD, on hemodialysis -Patient gets hemodialyzed once a week, still makes urine  GERD -Continue Protonix  Thrombocytopenia -Chronic thrombocytopenia -Platelet count is 34,000 today  Medications      amLODipine  5 mg Oral Daily   aspirin EC  81 mg Oral Daily   dextromethorphan-guaiFENesin  1 tablet Oral BID   feeding supplement  237 mL Oral BID BM   hydrALAZINE  50 mg Oral TID   methylPREDNISolone (SOLU-MEDROL) injection  40 mg Intravenous Q12H   pantoprazole  40 mg Oral Daily   sodium chloride flush  10 mL Intravenous Q12H     Data Reviewed:   CBG:  No results for input(s): "GLUCAP" in the last 168 hours.  SpO2: 97 %    Vitals:   01/10/23 1531 01/10/23 2133 01/10/23 2353 01/11/23 0450  BP: 135/79 136/77 (!) 142/77 (!) 150/81  Pulse: 91 81 77 73  Resp: 18 18 18 18   Temp: 99.7 F (37.6 C) 98.6 F (37 C) 98.8 F (37.1 C) 97.8 F (36.6 C)  TempSrc: Oral Oral Oral Oral  SpO2: 98% 99% 96% 97%  Weight:      Height:          Data Reviewed:  Basic Metabolic Panel: Recent Labs  Lab 01/09/23 1539 01/10/23 0608  NA 132* 134*  K 3.7 3.9  CL 96* 98  CO2 22 20*  GLUCOSE 98 128*  BUN 73* 80*  CREATININE 10.07* 10.64*  CALCIUM 9.9 9.4  MG  --  1.8    CBC: Recent Labs  Lab 01/09/23 1539 01/10/23 0608  WBC 6.6 8.4  NEUTROABS 5.7  --   HGB 13.4 10.6*  HCT 40.7 31.7*  MCV 97.6 92.4  PLT 45* 34*    LFT Recent Labs  Lab 01/09/23 1539 01/10/23 0608  AST 39 32  ALT 23 19  ALKPHOS 49 42  BILITOT 1.2 1.0  PROT 8.1 6.2*  ALBUMIN 4.2 3.1*     Antibiotics: Anti-infectives (From admission, onward)  Start     Dose/Rate Route Frequency Ordered Stop   01/10/23 1600  ceFEPIme (MAXIPIME) 1 g in sodium chloride 0.9 % 100 mL IVPB  Status:  Discontinued        1 g 200 mL/hr over 30 Minutes Intravenous Daily 01/09/23 2055 01/10/23 0918   01/10/23 1600  ceFAZolin (ANCEF) IVPB 1 g/50 mL premix        1 g 100 mL/hr over 30 Minutes Intravenous Every 24 hours 01/10/23 0918     01/09/23 2200  vancomycin (VANCOREADY) IVPB 500 mg/100 mL  Status:  Discontinued        500 mg 100 mL/hr over 60 Minutes Intravenous  Once 01/09/23 2055 01/09/23 2220   01/09/23 1515   ceFEPIme (MAXIPIME) 1 g in sodium chloride 0.9 % 100 mL IVPB        1 g 200 mL/hr over 30 Minutes Intravenous  Once 01/09/23 1510 01/09/23 1636   01/09/23 1415  ceFEPIme (MAXIPIME) 2 g in sodium chloride 0.9 % 100 mL IVPB  Status:  Discontinued        2 g 200 mL/hr over 30 Minutes Intravenous  Once 01/09/23 1404 01/09/23 1510   01/09/23 1415  metroNIDAZOLE (FLAGYL) IVPB 500 mg        500 mg 100 mL/hr over 60 Minutes Intravenous  Once 01/09/23 1404 01/09/23 1737   01/09/23 1415  vancomycin (VANCOCIN) IVPB 1000 mg/200 mL premix        1,000 mg 200 mL/hr over 60 Minutes Intravenous  Once 01/09/23 1404 01/09/23 1902        DVT prophylaxis: SCDs, no heparin due to thrombocytopenia  Code Status: Full code  Family Communication: No family at bedside   CONSULTS nephrology   Subjective     Objective    Physical Examination:  General-appears in no acute distress Heart-S1-S2, regular, no murmur auscultated Lungs-clear to auscultation bilaterally, no wheezing or crackles auscultated Abdomen-soft, nontender, no organomegaly Extremities-no edema in the lower extremities Neuro-alert, oriented x3, no focal deficit noted  Status is: Inpatient:        [name]   [facility] If 7PM-7AM, please contact night-coverage at [email or web address], Office  [phone]   01/11/2023, 8:42 AM  LOS: 2 days

## 2023-01-11 NOTE — Progress Notes (Signed)
[facility] Progress Note  Subjective: seen in room  Vitals:   01/10/23 2133 01/10/23 2353 01/11/23 0450 01/11/23 1006  BP: 136/77 (!) 142/77 (!) 150/81 (!) 145/82  Pulse: 81 77 73 75  Resp: 18 18 18    Temp: 98.6 F (37 C) 98.8 F (37.1 C) 97.8 F (36.6 C) 97.9 F (36.6 C)  TempSrc: Oral Oral Oral Oral  SpO2: 99% 96% 97% 100%  Weight:      Height:        Exam: General: Well developed, well nourished, in no acute distress. Room air. Lungs: Clear bilaterally to auscultation Breathing is unlabored. Heart: Tachycardic with 5/6 systolic murmur with click Abdomen: Soft, non-tender, non-distended with normoactive bowel sounds. Musculoskeletal:  Strength and tone appear normal for age. Lower extremities: No edema or ischemic changes Neuro: Alert and oriented X 3.  Dialysis Access: L AVF + bruit     Renal-related home meds: - clonidine 0.1 bid - percocet prn - norvasc 5 every day - hydralazine 50 tid  OP HD: Tues/Sat [place] - last HD 10/22, he only comes for HD on Tuesdays  4h   500/800   63.7kg   2/2 bath  P4   LUA AVF  Heparin 3000 - Mircera Iv q 4 weeks, last 10/15, due 11/05 - Calcitriol PO q HD - Venofer 50mg  IV weekly (tsat 24% on 10/15)   CXR 10/26 - no active disease   Assessment/Plan: Coag neg staph bacteremia - +blood cx's 10/26, temp 103 on admission. On IV Vanc/Cefepime/Flagyl. Of note, he essentially only comes for HD 1d/week so cannot plan for 3d/week antibiotic course. TEE is pending and ID is consulting. Pt is considering resuming 2 days of dialysis weekly (instead of once weekly), so possibly IV vanc will work. Will consult pharmacy.   Chest pain: For 3 weeks - troponin elevated, but stable. Per primary.  ESRD:  Usual sched Tues/Sat - he almost never comes to HD on Saturdays, but is now reconsidering as above. Last OP HD 10/22. Next HD 10/29. NO heparin (low plts).   Hypertension/volume: BP up slightly, continue home meds. Has been  losing weight, below prior EDW. Euvolemic on exam. Keep even next HD.   Anemia: Hgb 10.6, next esa due 11/05. Follow.  Metabolic bone disease: Ca ok, add on phos. Cont po vdra w/ hd biw.   Nutrition:  Alb 3.1 - adding protein supplements.  Weight loss: has been having fevers for the last month he says today. Wt loss probably due to infection, but can't r/o uremia also w/ creat 10 and once weekly dialysis. Is not grossly uremic on exam today.   Thrombocytopenia - in the 30- 50k range here, no bleeding noted. Hold heparin w/ HD.  H/o bioprosthetic aortic valve replacement - not sure when done.  H/o descending aortic dissection - feb 2016, s/p Bioprosthetic Bentall and total arch replacement and staged endovascular repair of descending aortic aneurysm at [facility] MD  [facility] 01/11/2023, 11:51 AM  Recent Labs  Lab 01/09/23 1539 01/10/23 0608  HGB 13.4 10.6*  ALBUMIN 4.2 3.1*  CALCIUM 9.9 9.4  CREATININE 10.07* 10.64*  K 3.7 3.9   No results for input(s): "IRON", "TIBC", "FERRITIN" in the last 168 hours. Inpatient medications:  amLODipine  5 mg Oral Daily   aspirin EC  81 mg Oral Daily   dextromethorphan-guaiFENesin  1 tablet Oral BID   feeding supplement  237 mL Oral BID BM  hydrALAZINE  50 mg Oral TID   methylPREDNISolone (SOLU-MEDROL) injection  40 mg Intravenous Q12H   multivitamin  1 tablet Oral QHS   pantoprazole  40 mg Oral Daily   sodium chloride flush  10 mL Intravenous Q12H     ceFAZolin (ANCEF) IV Stopped (01/10/23 1602)   acetaminophen, albuterol, fentaNYL (SUBLIMAZE) injection, ipratropium-albuterol, ondansetron **OR** ondansetron (ZOFRAN) IV

## 2023-01-12 DIAGNOSIS — R7989 Other specified abnormal findings of blood chemistry: Secondary | ICD-10-CM | POA: Diagnosis not present

## 2023-01-12 DIAGNOSIS — R509 Fever, unspecified: Secondary | ICD-10-CM | POA: Diagnosis not present

## 2023-01-12 DIAGNOSIS — R079 Chest pain, unspecified: Secondary | ICD-10-CM | POA: Diagnosis not present

## 2023-01-12 DIAGNOSIS — D696 Thrombocytopenia, unspecified: Secondary | ICD-10-CM | POA: Diagnosis not present

## 2023-01-12 LAB — RENAL FUNCTION PANEL
Albumin: 2.9 g/dL — ABNORMAL LOW (ref 3.5–5.0)
Anion gap: 15 (ref 5–15)
BUN: 147 mg/dL — ABNORMAL HIGH (ref 6–20)
CO2: 20 mmol/L — ABNORMAL LOW (ref 22–32)
Calcium: 10.3 mg/dL (ref 8.9–10.3)
Chloride: 97 mmol/L — ABNORMAL LOW (ref 98–111)
Creatinine, Ser: 11.45 mg/dL — ABNORMAL HIGH (ref 0.61–1.24)
GFR, Estimated: 5 mL/min — ABNORMAL LOW (ref >60)
Glucose, Bld: 167 mg/dL — ABNORMAL HIGH (ref 70–99)
Phosphorus: 5.5 mg/dL — ABNORMAL HIGH (ref 2.5–4.6)
Potassium: 4.4 mmol/L (ref 3.5–5.1)
Sodium: 132 mmol/L — ABNORMAL LOW (ref 135–145)

## 2023-01-12 LAB — CBC
HCT: 30.7 % — ABNORMAL LOW (ref 39.0–52.0)
Hemoglobin: 10.3 g/dL — ABNORMAL LOW (ref 13.0–17.0)
MCH: 31.4 pg (ref 26.0–34.0)
MCHC: 33.6 g/dL (ref 30.0–36.0)
MCV: 93.6 fL (ref 80.0–100.0)
Platelets: 51 10*3/uL — ABNORMAL LOW (ref 150–400)
RBC: 3.28 MIL/uL — ABNORMAL LOW (ref 4.22–5.81)
RDW: 14.3 % (ref 11.5–15.5)
WBC: 15.1 10*3/uL — ABNORMAL HIGH (ref 4.0–10.5)
nRBC: 0 % (ref 0.0–0.2)

## 2023-01-12 LAB — CULTURE, BLOOD (ROUTINE X 2): Special Requests: ADEQUATE

## 2023-01-12 LAB — HEPATITIS B SURFACE ANTIBODY, QUANTITATIVE: Hep B S AB Quant (Post): <3.5 m[IU]/mL — ABNORMAL LOW

## 2023-01-12 MED ORDER — SENNOSIDES-DOCUSATE SODIUM 8.6-50 MG PO TABS
2.0000 | ORAL_TABLET | Freq: Every evening | ORAL | Status: DC | PRN
  Administered 2023-01-12: 2 via ORAL
  Filled 2023-01-12: qty 2

## 2023-01-12 MED ORDER — BISACODYL 5 MG PO TBEC
5.0000 mg | DELAYED_RELEASE_TABLET | Freq: Every day | ORAL | Status: DC | PRN
  Administered 2023-01-12: 5 mg via ORAL
  Filled 2023-01-12: qty 1

## 2023-01-12 MED ORDER — PREDNISONE 20 MG PO TABS
40.0000 mg | ORAL_TABLET | Freq: Every day | ORAL | Status: DC
  Administered 2023-01-13 – 2023-01-14 (×2): 40 mg via ORAL
  Filled 2023-01-12 (×2): qty 2

## 2023-01-12 NOTE — Progress Notes (Signed)
[facility]  PROGRESS NOTE  [name] WGN:[id number] DOB: 1970/10/10 DOA: 01/09/2023 PCP: Loura Back, NP   Brief HPI:   52 y.o. male with medical history significant of hypertension, hyperlipidemia, CAD s/p CABG,  2 MIs, 3 CVAs, ESRD on hemodialysis once a week, on Tuesdays (patient still forms urine), descending thoracic aortic aneurysm, history of aortic valve replacement with bioprosthetic valve on statin and aspirin daily, severe left jugular venous distention for the past 8 months, tobacco use disorder who presents to the emergency department due to 2-week onset of midsternal chest pain described as pressure-like with radiation to both sides of the neck and bilateral arms.     Assessment/Plan:   Fever/bacteremia -Blood culture growing Staph epidermidis, started on vancomycin -ID changed antibiotics to cefazolin -Unclear source -TTE showed no valvular vegetations -ID consulted, recommend TEE -Platelet counts have improved to 51,000, plan for TEE in a.m.  Chest pain Patient has left-sided chest pain with radiation to back -Troponin was elevated to 300, 400 -Cardiology was consulted last night, did not recommend any intervention -Aspirin 324 mg p.o. x 1 and and nitroglycerin 0.4 mg sublingual x 1 was given. Continue aspirin 81 mg p.o. daily; Patient does not want nitroglycerin -Cardiology consulted  Aortic valve stenosis -Echocardiogram shows severe stenosis of prosthetic aortic valve -TEE scheduled for tomorrow  Acute bronchitis -Patient says he has been coughing up yellow phlegm -Continue DuoNebs, Mucinex, Solu-Medrol -Will discontinue Solu-Medrol and start p.o. prednisone taper from tomorrow morning  CAD status post CABG -Continue aspirin  Hypertension -Continue amlodipine, hydralazine  Hyperlipidemia -Continue Repatha every 14 days  ESRD, on hemodialysis -Patient gets hemodialyzed once a week, still makes urine  GERD -Continue  Protonix  Thrombocytopenia -Chronic thrombocytopenia with baseline around 80,000 -Platelet count is 51,000 this morning -Will consult oncology for further evaluation of thrombocytopenia  Medications     amLODipine  5 mg Oral Daily   aspirin EC  81 mg Oral Daily   calcitRIOL  2 mcg Oral Once per day on Tuesday Saturday   Chlorhexidine Gluconate Cloth  6 each Topical Q0600   dextromethorphan-guaiFENesin  1 tablet Oral BID   feeding supplement  237 mL Oral BID BM   hydrALAZINE  50 mg Oral TID   methylPREDNISolone (SOLU-MEDROL) injection  40 mg Intravenous Q12H   multivitamin  1 tablet Oral QHS   pantoprazole  40 mg Oral Daily   sodium chloride flush  10 mL Intravenous Q12H     Data Reviewed:   CBG:  No results for input(s): "GLUCAP" in the last 168 hours.  SpO2: 100 %    Vitals:   01/12/23 0500 01/12/23 0535 01/12/23 0539 01/12/23 0734  BP:  (!) 146/80  131/68  Pulse:  79  70  Resp:  18  16  Temp:  97.6 F (36.4 C)  (!) 97.4 F (36.3 C)  TempSrc:  Oral  Oral  SpO2:  100%  100%  Weight: 61.1 kg  61.1 kg   Height:          Data Reviewed:  Basic Metabolic Panel: Recent Labs  Lab 01/09/23 1539 01/10/23 0608 01/11/23 1257  NA 132* 134*  --   K 3.7 3.9  --   CL 96* 98  --   CO2 22 20*  --   GLUCOSE 98 128*  --   BUN 73* 80*  --   CREATININE 10.07* 10.64*  --   CALCIUM 9.9 9.4  --   MG  --  1.8  --  PHOS  --   --  4.8*    CBC: Recent Labs  Lab 01/09/23 1539 01/10/23 0608  WBC 6.6 8.4  NEUTROABS 5.7  --   HGB 13.4 10.6*  HCT 40.7 31.7*  MCV 97.6 92.4  PLT 45* 34*    LFT Recent Labs  Lab 01/09/23 1539 01/10/23 0608  AST 39 32  ALT 23 19  ALKPHOS 49 42  BILITOT 1.2 1.0  PROT 8.1 6.2*  ALBUMIN 4.2 3.1*     Antibiotics: Anti-infectives (From admission, onward)    Start     Dose/Rate Route Frequency Ordered Stop   01/10/23 1600  ceFEPIme (MAXIPIME) 1 g in sodium chloride 0.9 % 100 mL IVPB  Status:  Discontinued        1 g 200 mL/hr  over 30 Minutes Intravenous Daily 01/09/23 2055 01/10/23 0918   01/10/23 1600  ceFAZolin (ANCEF) IVPB 1 g/50 mL premix        1 g 100 mL/hr over 30 Minutes Intravenous Every 24 hours 01/10/23 0918     01/09/23 2200  vancomycin (VANCOREADY) IVPB 500 mg/100 mL  Status:  Discontinued        500 mg 100 mL/hr over 60 Minutes Intravenous  Once 01/09/23 2055 01/09/23 2220   01/09/23 1515  ceFEPIme (MAXIPIME) 1 g in sodium chloride 0.9 % 100 mL IVPB        1 g 200 mL/hr over 30 Minutes Intravenous  Once 01/09/23 1510 01/09/23 1636   01/09/23 1415  ceFEPIme (MAXIPIME) 2 g in sodium chloride 0.9 % 100 mL IVPB  Status:  Discontinued        2 g 200 mL/hr over 30 Minutes Intravenous  Once 01/09/23 1404 01/09/23 1510   01/09/23 1415  metroNIDAZOLE (FLAGYL) IVPB 500 mg        500 mg 100 mL/hr over 60 Minutes Intravenous  Once 01/09/23 1404 01/09/23 1737   01/09/23 1415  vancomycin (VANCOCIN) IVPB 1000 mg/200 mL premix        1,000 mg 200 mL/hr over 60 Minutes Intravenous  Once 01/09/23 1404 01/09/23 1902        DVT prophylaxis: SCDs, no heparin due to thrombocytopenia  Code Status: Full code  Family Communication: No family at bedside   CONSULTS nephrology   Subjective   Still complains of left-sided chest pain.  EKG obtained this morning showed normal sinus rhythm.  Cardiology following.  Objective    Physical Examination:  General-appears in no acute distress Heart-S1-S2, regular, no murmur auscultated Lungs-clear to auscultation bilaterally, no wheezing or crackles auscultated Abdomen-soft, nontender, no organomegaly Extremities-no edema in the lower extremities Neuro-alert, oriented x3, no focal deficit noted Status is: Inpatient:        [name]   [facility] If 7PM-7AM, please contact night-coverage at [email or web address], Office  [phone]   01/12/2023, 8:41 AM  LOS: 3 days

## 2023-01-12 NOTE — Progress Notes (Addendum)
[facility] Progress Note  Subjective: seen in room  Vitals:   01/12/23 1130 01/12/23 1200 01/12/23 1230 01/12/23 1300  BP: 125/67 133/60 134/68 (!) 148/77  Pulse:      Resp: 12 12    Temp:      TempSrc:      SpO2:      Weight:      Height:        Exam: General: Well developed, well nourished, in no acute distress. Room air. Lungs: Clear bilaterally to auscultation Breathing is unlabored. Heart: Tachycardic with 5/6 systolic murmur with click Abdomen: Soft, non-tender, non-distended with normoactive bowel sounds. Musculoskeletal:  Strength and tone appear normal for age. Lower extremities: No edema or ischemic changes Neuro: Alert and oriented X 3.  Dialysis Access: L AVF + bruit     Renal-related home meds: - clonidine 0.1 bid - percocet prn - norvasc 5 every day - hydralazine 50 tid    OP HD access history: F'gram 02/2021: Angioplasty L BC vein stenosis, angioplasty L subclavian vein, angioplasty L basilic vein transposition. F'gram 07/2021: Angioplasty L BC vein stenosis. F'gram 12/25/22: Angioplasty L BC vein with stenting   OP HD: Tues/Sat [place] - last HD 10/22, he only comes for HD on Tuesdays  4h   500/800   63.7kg   2/2 bath  P4   LUA AVF  Heparin 3000 - Mircera Iv q 4 weeks, last 10/15, due 11/05 - Calcitriol PO q HD - Venofer 50mg  IV weekly (tsat 24% on 10/15)   CXR 10/26 - no active disease   Assessment/Plan: Coag neg staph bacteremia - +blood cx's 10/26, temp 103 on admission. On IV Vanc/Cefepime/Flagyl. Of note, he essentially only comes for HD 1d/week so cannot plan for 3d/week antibiotic course. TEE is pending and ID is consulting. Pt is considering resuming 2 days of dialysis weekly (instead of once weekly), so possibly IV vanc will work.   Chest pain: For 3 weeks - troponin elevated, but stable. Per primary.  ESRD:  Usual sched Tues/Sat - he almost never comes to HD on Saturdays, but is now reconsidering as above. Last OP HD  10/22. HD today. NO heparin due to low plts.   Hypertension/volume: BP up slightly, continue home meds. Has been losing weight, below prior EDW. Euvolemic on exam. Keep even next HD.   Anemia: Hgb 10.6, next esa due 11/05. Follow.  Metabolic bone disease: CCa and phos are in range. Cont po vdra w/ hd   Nutrition:  Alb 3.1 - adding protein supplements.  Weight loss: has been having fevers for the last month he says today. Wt loss probably due to infection, but can't r/o uremia also w/ creat 10 and once weekly dialysis. Is not grossly uremic on exam today.   Thrombocytopenia - in the 30- 50k range here, no bleeding noted. Holding heparin w/ HD. Up to 50s today.  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 Inpatient medications:  amLODipine  5 mg Oral Daily   aspirin EC  81 mg Oral Daily   calcitRIOL  2 mcg Oral Once per day on Tuesday Saturday   Chlorhexidine Gluconate Cloth  6 each Topical Q0600   dextromethorphan-guaiFENesin  1 tablet Oral BID   feeding supplement  237 mL Oral BID BM   hydrALAZINE  50 mg Oral TID   methylPREDNISolone (SOLU-MEDROL) injection  40 mg Intravenous Q12H   multivitamin  1 tablet Oral QHS   pantoprazole  40 mg Oral Daily   sodium chloride flush  10 mL Intravenous Q12H     ceFAZolin (ANCEF) IV 1 g (01/11/23 1635)   acetaminophen, fentaNYL (SUBLIMAZE) injection, ipratropium-albuterol, ondansetron **OR** ondansetron (ZOFRAN) IV

## 2023-01-12 NOTE — Progress Notes (Signed)
Pt receives out-pt HD at [facility]. Will assist as needed.   Olivia Canter Renal Navigator [phone]

## 2023-01-12 NOTE — Plan of Care (Signed)

## 2023-01-12 NOTE — Progress Notes (Addendum)
Pt completed tx without adverse reaction. Pt tolerated well.Zofran 4 mg IV given before HD.  01/12/23 1314  Vitals  Temp 97.7 F (36.5 C)  Temp Source Oral  BP 138/75  BP Location Right Arm  BP Method Automatic  Patient Position (if appropriate) Lying  Resp 16  Oxygen Therapy  SpO2 96 %  O2 Device Room Air  During Treatment Monitoring  Intra-Hemodialysis Comments Tx completed  Post Treatment  Dialyzer Clearance Lightly streaked  Hemodialysis Intake (mL) 0 mL  Liters Processed 76  Fluid Removed (mL) 0 mL  Tolerated HD Treatment Yes  Post-Hemodialysis Comments Pt tolerated tx well.  AVG/AVF Arterial Site Held (minutes) 10 minutes  AVG/AVF Venous Site Held (minutes) 10 minutes  Fistula / Graft Left Upper arm Arteriovenous fistula  Placement Date/Time: 09/09/20 1117   Orientation: Left  Access Location: (c) Upper arm  Access Type: Arteriovenous fistula  Site Condition No complications  Fistula / Graft Assessment Present;Thrill;Bruit  Status Deaccessed  Needle Size 15  Drainage Description None

## 2023-01-12 NOTE — Consult Note (Signed)
[facility] CONSULT NOTE  Patient Care Team: Loura Back, NP as PCP - General (Nurse Practitioner) Laurey Morale, MD as PCP - Advanced Heart Failure (Cardiology) Laurey Morale, MD as PCP - Cardiology (Cardiology) Ginette Otto, [facility]  CHIEF COMPLAINTS/PURPOSE OF CONSULTATION:  Acute on chronic thrombocytopenia  HISTORY OF PRESENTING ILLNESS:  [name] 52 y.o. male is here because of chest pain.  Cardiology evaluated him and heart catheterization could not be performed because of thrombocytopenia.  The history of thrombocytopenia is quite chronic.  The first time his platelet counts dropped below 100  Was in December 2023.  Subsequently his platelets have been stable around 80.  However during this hospitalization they had dropped down to 34 and subsequently improved to 51 as of today.  He has not had any bleeding or bruising symptoms.  He quit alcohol 10 years ago.  He is on hemodialysis.  I reviewed her records extensively and collaborated the history with the patient.   MEDICAL HISTORY:  Past Medical History:  Diagnosis Date   Adenomatous colon polyp 08/2015   Anxiety    Aortic disease (HCC)    Aortic dissection (HCC)    a. admx 04/2014 >> L renal infarct; a/c renal failure >> b.  s/p Bioprosthetic Bentall and total arch replacement and staged endovascular repair of descending aortic aneurysm ([facility] - Dr. Kizzie Bane)   CAD (coronary artery disease)    a. LHC 4/16:  oD1 60%   Cardiomyopathy (HCC)    a. non-ischemic - probably related to untreated HTN and ETOH abuse - Echo 3/13 with EF 35-40% >> b. Echo 4/16: Severe LVH, EF 55-60%, moderate AI, moderate MR, mild LAE, trivial effusion, known type B dissection with communication between true and false lumens with suprasternal images suggesting dissection plane may propagate to at least left subclavian takeoff, root above aortic valve okay     Chronic abdominal pain    Chronic combined systolic and  diastolic congestive heart failure (HCC)    a. 05/2011: Adm with pulm edema/HTN urgency, EF 35-40% with diffuse hypokinesis and moderate to severe mitral regurgitation. Cardiomyopathy likely due to uncontrolled HTN and ETOH abuse - cath deferred due to renal insufficiency (felt due to uncontrolled HTN). bElzie Rings MV 06/2011: EF 37% and no ischemia or infarction. c. EF 45-50% by echo 01/2012.   Chronic sinusitis    DDD (degenerative disc disease), lumbar    Depression    Descending thoracic aortic aneurysm (HCC)    Dissecting aneurysm of thoracic aorta (HCC)    ESRD (end stage renal disease) (HCC)    on dialysis 1-2 days per week (2 days ordered, only coming 1 day per week as of oct 2024). on dialysis 3-4 years total   ETOH abuse    a. Reported to have quit 05/2011.   Frequent headaches    GERD (gastroesophageal reflux disease)    Headache(784.0)    "q other day" (08/08/2013)   Heart murmur    Hemorrhoid thrombosis    History of echocardiogram    Echo 1/17:  Severe LVH, EF 55-60%, no RWMA, Gr 2 DD, AVR ok, mild to mod MR, mild LAE, mild reduced RVSF, mod RAE   History of medication noncompliance    HYPERLIPIDEMIA    Hypertension    a. Hx of HTN urgency secondary to noncompliance. b. urinary metanephrine and catecholeamine levels normal 2013.  c. Renal art [name] 1/16:  No evidence of renal artery stenosis noted bilaterally.  INGUINAL HERNIA    Pneumonia ~ 2013   Serrated adenoma of colon 08/2015   Stroke [facility])    Tobacco abuse    Valvular heart disease    a. Echo 05/2011: moderate to severe eccentric MR and mild to moderate AI with prolapsing left coronary cusp. b. Echo 01/2012: mild-mod AI, mild dilitation of aortic root, mild MR.;  c. Echo 1/16: Severe LVH consistent with hypertrophic cardio myopathy, EF 50%, no RWMA, mod AI, mild MR, mild RAE, dilated Ao root (40 mm);     SURGICAL HISTORY: Past Surgical History:  Procedure Laterality Date   A/V FISTULAGRAM Left 03/07/2021   Procedure:  A/V FISTULAGRAM;  Surgeon: Chuck Hint, MD;  Location: [facility] INVASIVE CV LAB;  Service: Cardiovascular;  Laterality: Left;   A/V FISTULAGRAM Left 07/18/2021   Procedure: A/V Fistulagram;  Surgeon: Chuck Hint, MD;  Location: [facility] INVASIVE CV LAB;  Service: Cardiovascular;  Laterality: Left;   A/V FISTULAGRAM Left 12/25/2022   Procedure: A/V Fistulagram;  Surgeon: Leonie Douglas, MD;  Location: [facility] INVASIVE CV LAB;  Service: Cardiovascular;  Laterality: Left;   ANKLE SURGERY Bilateral    Fractures bilaterally   AORTIC VALVE SURGERY  09/2014   AV FISTULA PLACEMENT Left 09/09/2020   Procedure: LEFT ARM ARTERIOVENOUS (AV) FISTULA CREATION;  Surgeon: Leonie Douglas, MD;  Location: [facility] OR;  Service: Vascular;  Laterality: Left;   BASCILIC VEIN TRANSPOSITION Left 10/09/2020   Procedure: LEFT SECOND STAGE BASILIC VEIN TRANSPOSITION;  Surgeon: Leonie Douglas, MD;  Location: [facility] OR;  Service: Vascular;  Laterality: Left;  PERIPHERAL NERVE BLOCK   CORONARY ANGIOGRAPHY N/A 12/06/2017   Procedure: CORONARY ANGIOGRAPHY;  Surgeon: Lyn Records, MD;  Location: [facility] INVASIVE CV LAB;  Service: Cardiovascular;  Laterality: N/A;   CORONARY ANGIOGRAPHY N/A 09/26/2018   Procedure: CORONARY ANGIOGRAPHY;  Surgeon: Yvonne Kendall, MD;  Location: [facility] INVASIVE CV LAB;  Service: Cardiovascular;  Laterality: N/A;   CORONARY STENT INTERVENTION N/A 12/10/2017   Procedure: CORONARY STENT INTERVENTION;  Surgeon: Lennette Bihari, MD;  Location: [facility] INVASIVE CV LAB;  Service: Cardiovascular;  Laterality: N/A;   FOOT FRACTURE SURGERY Bilateral 2004-2010   "got pins in both of them"   HEMORRHOID SURGERY N/A 06/15/2015   Procedure: HEMORRHOIDECTOMY;  Surgeon: Almond Lint, MD;  Location: [facility] OR;  Service: General;  Laterality: N/A;   INGUINAL HERNIA REPAIR Right ~ 1996   INSERTION OF DIALYSIS CATHETER N/A 09/09/2020   Procedure: INSERTION OF TUNNELED DIALYSIS PALINDROME 19cm CATHETER;  Surgeon: Leonie Douglas, MD;   Location: [facility] OR;  Service: Vascular;  Laterality: N/A;   LEFT HEART CATH AND CORONARY ANGIOGRAPHY N/A 02/05/2021   Procedure: LEFT HEART CATH AND CORONARY ANGIOGRAPHY;  Surgeon: Laurey Morale, MD;  Location: [facility] INVASIVE CV LAB;  Service: Cardiovascular;  Laterality: N/A;   LEFT HEART CATHETERIZATION WITH CORONARY ANGIOGRAM N/A 06/21/2014   Procedure: LEFT HEART CATHETERIZATION WITH CORONARY ANGIOGRAM;  Surgeon: Laurey Morale, MD;  Location: [facility] CATH LAB;  Service: Cardiovascular;  Laterality: N/A;   LOOP RECORDER INSERTION N/A 06/19/2016   Procedure: Loop Recorder Insertion;  Surgeon: [facility] Salvia, MD;  Location: [facility] INVASIVE CV LAB;  Service: Cardiovascular;  Laterality: N/A;   PERIPHERAL VASCULAR BALLOON ANGIOPLASTY  03/07/2021   Procedure: PERIPHERAL VASCULAR BALLOON ANGIOPLASTY;  Surgeon: Chuck Hint, MD;  Location: [facility] INVASIVE CV LAB;  Service: Cardiovascular;;   PERIPHERAL VASCULAR BALLOON ANGIOPLASTY  07/18/2021   Procedure: PERIPHERAL VASCULAR BALLOON ANGIOPLASTY;  Surgeon: Chuck Hint,  MD;  Location: [facility] INVASIVE CV LAB;  Service: Cardiovascular;;   PERIPHERAL VASCULAR INTERVENTION Left 12/25/2022   Procedure: PERIPHERAL VASCULAR INTERVENTION;  Surgeon: Leonie Douglas, MD;  Location: [facility] INVASIVE CV LAB;  Service: Cardiovascular;  Laterality: Left;  stent to brachiocephalic vein   PERIPHERAL VASCULAR ULTRASOUND/IVUS Left 12/25/2022   Procedure: Peripheral Vascular Ultrasound/IVUS;  Surgeon: Leonie Douglas, MD;  Location: [facility] INVASIVE CV LAB;  Service: Cardiovascular;  Laterality: Left;   TEE WITHOUT CARDIOVERSION N/A 03/12/2016   Procedure: TRANSESOPHAGEAL ECHOCARDIOGRAM (TEE);  Surgeon: Thurmon Fair, MD;  Location: [facility] ENDOSCOPY;  Service: Cardiovascular;  Laterality: N/A;   TEE WITHOUT CARDIOVERSION N/A 10/16/2020   Procedure: TRANSESOPHAGEAL ECHOCARDIOGRAM (TEE);  Surgeon: Laurey Morale, MD;  Location: [facility] ENDOSCOPY;  Service: Cardiovascular;  Laterality: N/A;    UPPER EXTREMITY VENOGRAPHY Right 07/18/2021   Procedure: UPPER EXTREMITY VENOGRAPHY;  Surgeon: Chuck Hint, MD;  Location: [facility] INVASIVE CV LAB;  Service: Cardiovascular;  Laterality: Right;    SOCIAL HISTORY: Social History   Socioeconomic History   Marital status: Married    Spouse name: Not on file   Number of children: 5   Years of education: 12   Highest education level: Not on file  Occupational History   Occupation: Disabled, part-time Corporate investment banker    Comment: Disability  Tobacco Use   Smoking status: Some Days    Current packs/day: 0.25    Average packs/day: 0.3 packs/day for 23.0 years (5.8 ttl pk-yrs)    Types: Cigarettes    Passive exposure: Current   Smokeless tobacco: Never   Tobacco comments:    3 to 4 per day  Vaping Use   Vaping status: Not on file  Substance and Sexual Activity   Alcohol use: Not Currently    Alcohol/week: 1.0 standard drink of alcohol    Types: 1 Cans of beer per week    Comment: On special occasion   Drug use: Yes    Types: Marijuana    Comment: 2 times/week - last use 10/06/20   Sexual activity: Yes  Other Topics Concern   Not on file  Social History Narrative   Fun: Enjoy his children   Social Determinants of Health   Financial Resource Strain: Low Risk  (07/07/2022)   [facility] from [facility]   Overall Financial Resource Strain (CARDIA)    Difficulty of Paying Living Expenses: Not hard at all  Food Insecurity: No Food Insecurity (01/09/2023)   Hunger Vital Sign    Worried About Running Out of Food in the Last Year: Never true    Ran Out of Food in the Last Year: Never true  Transportation Needs: No Transportation Needs (01/09/2023)   PRAPARE - Administrator, Civil Service (Medical): No    Lack of Transportation (Non-Medical): No  Physical Activity: Not on file  Stress: Not on file  Social Connections: Unknown (05/11/2022)   Received from [facility], [facility]   Social Network     Social Network: Not on file  Intimate Partner Violence: Not At Risk (01/09/2023)   Humiliation, Afraid, Rape, and Kick questionnaire    Fear of Current or Ex-Partner: No    Emotionally Abused: No    Physically Abused: No    Sexually Abused: No    FAMILY HISTORY: Family History  Problem Relation Age of Onset   Hypertension Mother    Hypertension Other    Colon cancer Paternal Uncle    Stroke Maternal Aunt  Heart attack Brother    Hypertension Brother    Diabetes Maternal Aunt    Lung cancer Maternal Uncle    Stroke Maternal Uncle    Other Sister        "breathing machine at night"   Headache Sister    Thyroid disease Sister    Throat cancer Neg Hx    Pancreatic cancer Neg Hx    Esophageal cancer Neg Hx    Kidney disease Neg Hx    Liver disease Neg Hx     ALLERGIES:  is allergic to clonidine derivatives, imdur [isosorbide nitrate], and tramadol.  MEDICATIONS:  Current Facility-Administered Medications  Medication Dose Route Frequency Provider Last Rate Last Admin   acetaminophen (TYLENOL) tablet 650 mg  650 mg Oral Q6H PRN Adefeso, Oladapo, DO   650 mg at 01/10/23 1531   amLODipine (NORVASC) tablet 5 mg  5 mg Oral Daily Adefeso, Oladapo, DO   5 mg at 01/12/23 0835   aspirin EC tablet 81 mg  81 mg Oral Daily Adefeso, Oladapo, DO   81 mg at 01/12/23 9811   calcitRIOL (ROCALTROL) capsule 2 mcg  2 mcg Oral Once per day on Tuesday Saturday Delano Metz, MD   2 mcg at 01/12/23 0835   ceFAZolin (ANCEF) IVPB 1 g/50 mL premix  1 g Intravenous Q24H [name], MD 100 mL/hr at 01/11/23 1635 1 g at 01/11/23 1635   Chlorhexidine Gluconate Cloth 2 % PADS 6 each  6 each Topical Q0600 Delano Metz, MD   6 each at 01/12/23 0537   dextromethorphan-guaiFENesin (MUCINEX DM) 30-600 MG per 12 hr tablet 1 tablet  1 tablet Oral BID Adefeso, Oladapo, DO   1 tablet at 01/12/23 0834   feeding supplement (ENSURE ENLIVE / ENSURE PLUS) liquid 237 mL  237 mL Oral BID BM Adefeso, Oladapo, DO    237 mL at 01/12/23 1337   fentaNYL (SUBLIMAZE) injection 50 mcg  50 mcg Intravenous Q2H PRN Hillary Bow, DO   50 mcg at 01/12/23 1356   hydrALAZINE (APRESOLINE) tablet 50 mg  50 mg Oral TID Adefeso, Oladapo, DO   50 mg at 01/12/23 0834   ipratropium-albuterol (DUONEB) 0.5-2.5 (3) MG/3ML nebulizer solution 3 mL  3 mL Nebulization Q4H PRN Adefeso, Oladapo, DO       methylPREDNISolone sodium succinate (SOLU-MEDROL) 40 mg/mL injection 40 mg  40 mg Intravenous Q12H Adefeso, Oladapo, DO   40 mg at 01/12/23 9147   multivitamin (RENA-VIT) tablet 1 tablet  1 tablet Oral QHS [name], MD   1 tablet at 01/11/23 2205   ondansetron (ZOFRAN) tablet 4 mg  4 mg Oral Q6H PRN Frankey Shown, DO       Or   ondansetron (ZOFRAN) injection 4 mg  4 mg Intravenous Q6H PRN Adefeso, Oladapo, DO   4 mg at 01/12/23 0932   pantoprazole (PROTONIX) EC tablet 40 mg  40 mg Oral Daily Adefeso, Oladapo, DO   40 mg at 01/12/23 0834   sodium chloride flush (NS) 0.9 % injection 10 mL  10 mL Intravenous Q12H Adefeso, Oladapo, DO   10 mL at 01/12/23 8295    REVIEW OF SYSTEMS:   Constitutional: Denies fevers, chills or abnormal night sweats All other systems were reviewed with the patient and are negative.  PHYSICAL EXAMINATION: ECOG PERFORMANCE STATUS: 1 - Symptomatic but completely ambulatory  Vitals:   01/12/23 1314 01/12/23 1406  BP: 138/75 (!) 140/67  Pulse:  75  Resp: 16 18  Temp:  97.7 F (36.5 C) 97.6 F (36.4 C)  SpO2: 96% 98%   Filed Weights   01/12/23 0539 01/12/23 0957 01/12/23 1318  Weight: 134 lb 11.2 oz (61.1 kg) 134 lb 11.2 oz (61.1 kg) 134 lb 11.2 oz (61.1 kg)    GENERAL:alert, no distress and comfortable  LABORATORY DATA:  I have reviewed the data as listed Lab Results  Component Value Date   WBC 15.1 (H) 01/12/2023   HGB 10.3 (L) 01/12/2023   HCT 30.7 (L) 01/12/2023   MCV 93.6 01/12/2023   PLT 51 (L) 01/12/2023   Lab Results  Component Value Date   NA 132 (L) 01/12/2023   K  4.4 01/12/2023   CL 97 (L) 01/12/2023   CO2 20 (L) 01/12/2023    RADIOGRAPHIC STUDIES: I have personally reviewed the radiological reports and agreed with the findings in the report.  ASSESSMENT AND PLAN:   Acute on chronic thrombocytopenia: His baseline platelet count is around 80.  During this hospitalization it had dropped down to 34 and subsequently improved to 51.  I suspect that the acute drop in the platelet count is most likely related to his febrile illness. Plan: I will request immature platelet fraction to assess if the cause of the thrombocytopenia is decreased production or increased destruction.  We will also check a B12 and folic acid levels. It has been noted and patient is on hemodialysis to have episodes of thrombocytopenia which could be related to dialysis membrane induced thrombocytopenia.  I suspect that his platelet counts will recover to his baseline once the acute illness subsides. Since his platelets are greater than 50, patient may be eligible to undergo heart catheterization if needed.   All questions were answered. The patient knows to call the clinic with any problems, questions or concerns.    Tamsen Meek, MD @T @

## 2023-01-12 NOTE — Care Management Important Message (Signed)
Important Message  Patient Details  Name: [name] MRN: [id number] Date of Birth: 06/20/1970   Important Message Given:  Yes - Medicare IM     Dorena Bodo 01/12/2023, 2:13 PM

## 2023-01-12 NOTE — Progress Notes (Signed)
[facility]  Date of Admission:  01/09/2023   Total days of inpatient antibiotics 3  Principal Problem:   Acute febrile illness Active Problems:   Tobacco use   Thrombocytopenia (HCC)   GERD without esophagitis   Essential hypertension   Mixed hyperlipidemia   Coronary artery disease involving native coronary artery of native heart with angina pectoris (HCC)   Chest pain   COPD (chronic obstructive pulmonary disease) (HCC)   End-stage renal disease on hemodialysis (HCC)   SIRS (systemic inflammatory response syndrome) (HCC)   Acute bronchitis   Chronic heart failure with preserved ejection fraction (HFpEF) (HCC)   Elevated troponin   Weight loss   Protein-calorie malnutrition, severe          Assessment: 52 YM admitted with #MSSE bacteremia with prosthetic AV #low back pain -4/4 bottle MSSE( both sets from right arm) -TTE noted severe AV stenosis-->will order TEE Recommendations: -Continue cefazolin -TEE -MRI lumbar spine -Follow sens fro MSSE -Repeat blood cx to ensure clearance   Microbiology:   Antibiotics: Cefepime  ,vanc, metro 10/26 Cefazolin 10/27-  Cultures: Blood 1026 4/4 msse 10/27 Urine  Other   SUBJECTIVE: Resprots ongoing low back pain Interval: afebrilee overinght  Review of Systems: Review of Systems  All other systems reviewed and are negative.    Scheduled Meds:  amLODipine  5 mg Oral Daily   aspirin EC  81 mg Oral Daily   calcitRIOL  2 mcg Oral Once per day on Tuesday Saturday   Chlorhexidine Gluconate Cloth  6 each Topical Q0600   dextromethorphan-guaiFENesin  1 tablet Oral BID   feeding supplement  237 mL Oral BID BM   hydrALAZINE  50 mg Oral TID   methylPREDNISolone (SOLU-MEDROL) injection  40 mg Intravenous Q12H   multivitamin  1 tablet Oral QHS   pantoprazole  40 mg Oral Daily   sodium chloride flush  10 mL Intravenous Q12H   Continuous Infusions:   ceFAZolin (ANCEF) IV 1 g  (01/11/23 1635)   PRN Meds:.acetaminophen, albuterol, fentaNYL (SUBLIMAZE) injection, ipratropium-albuterol, ondansetron **OR** ondansetron (ZOFRAN) IV Allergies  Allergen Reactions   Clonidine Derivatives Nausea And Vomiting and Other (See Comments)    Tablets by mouth = Tolerated  Patches on the skin = Nausea & vomiting after wearing 4-5 days    Imdur [Isosorbide Nitrate] Other (See Comments)    Headaches -- patient instructed to continue taking    Tramadol Other (See Comments)    Headaches     OBJECTIVE: Vitals:   01/11/23 2351 01/12/23 0500 01/12/23 0535 01/12/23 0539  BP: 139/79  (!) 146/80   Pulse: 76  79   Resp: 18  18   Temp: 97.7 F (36.5 C)  97.6 F (36.4 C)   TempSrc: Oral  Oral   SpO2: 100%  100%   Weight:  61.1 kg  61.1 kg  Height:       Body mass index is 18.79 kg/m.  Physical Exam Constitutional:      General: He is not in acute distress.    Appearance: He is normal weight. He is not toxic-appearing.  HENT:     Head: Normocephalic and atraumatic.     Right Ear: External ear normal.     Left Ear: External ear normal.     Nose: No congestion or rhinorrhea.     Mouth/Throat:     Mouth: Mucous membranes are moist.     Pharynx: Oropharynx is clear.  Eyes:     Extraocular Movements: Extraocular movements intact.     Conjunctiva/sclera: Conjunctivae normal.     Pupils: Pupils are equal, round, and reactive to light.  Cardiovascular:     Rate and Rhythm: Normal rate and regular rhythm.     Heart sounds: No murmur heard.    No friction rub. No gallop.  Pulmonary:     Effort: Pulmonary effort is normal.     Breath sounds: Normal breath sounds.  Abdominal:     General: Abdomen is flat. Bowel sounds are normal.     Palpations: Abdomen is soft.  Musculoskeletal:        General: No swelling. Normal range of motion.     Cervical back: Normal range of motion and neck supple.  Skin:    General: Skin is warm and dry.  Neurological:     General: No focal  deficit present.     Mental Status: He is oriented to person, place, and time.  Psychiatric:        Mood and Affect: Mood normal.       Lab Results Lab Results  Component Value Date   WBC 8.4 01/10/2023   HGB 10.6 (L) 01/10/2023   HCT 31.7 (L) 01/10/2023   MCV 92.4 01/10/2023   PLT 34 (L) 01/10/2023    Lab Results  Component Value Date   CREATININE 10.64 (H) 01/10/2023   BUN 80 (H) 01/10/2023   NA 134 (L) 01/10/2023   K 3.9 01/10/2023   CL 98 01/10/2023   CO2 20 (L) 01/10/2023    Lab Results  Component Value Date   ALT 19 01/10/2023   AST 32 01/10/2023   ALKPHOS 42 01/10/2023   BILITOT 1.0 01/10/2023        Danelle Earthly, MD [facility] 01/12/2023, 5:59 AM I have personally spent 52 minutes involved in face-to-face and non-face-to-face activities for this patient on the day of the visit. Professional time spent includes the following activities: Preparing to see the patient (review of tests), Obtaining and/or reviewing separately obtained history (admission/discharge record), Performing a medically appropriate examination and/or evaluation , Ordering medications/tests/procedures, referring and communicating with other health care professionals, Documenting clinical information in the EMR, Independently interpreting results (not separately reported), Communicating results to the patient/family/caregiver, Counseling and educating the patient/family/caregiver and Care coordination (not separately reported).

## 2023-01-12 NOTE — Progress Notes (Signed)
[facility]  Date of Admission:  01/09/2023   Total days of inpatient antibiotics 3  Principal Problem:   Acute febrile illness Active Problems:   Tobacco use   Thrombocytopenia (HCC)   GERD without esophagitis   Essential hypertension   Mixed hyperlipidemia   Coronary artery disease involving native coronary artery of native heart with angina pectoris (HCC)   Chest pain   COPD (chronic obstructive pulmonary disease) (HCC)   End-stage renal disease on hemodialysis (HCC)   SIRS (systemic inflammatory response syndrome) (HCC)   Acute bronchitis   Chronic heart failure with preserved ejection fraction (HFpEF) (HCC)   Elevated troponin   Weight loss   Protein-calorie malnutrition, severe          Assessment: 52 YM admitted with #MSSE bacteremia with prosthetic AV #low back pain -4/4 bottle MSSE( both sets from right arm) which makes it difficult to discern contamination versus true bacteremia. -TTE noted severe AV stenosis-->will order TEE -MRI lumbar spine did not show infection Recommendations: -Continue cefazolin -TEE ordered. -Follow sens for MSSE, both sets -follow repeat blood cx to ensure clearance   Microbiology:   Antibiotics: Cefepime  ,vanc, metro 10/26 Cefazolin 10/27-  Cultures: Blood 1026 4/4 msse 10/27 Urine  Other   SUBJECTIVE: Resprots ongoing low back pain Interval: afebrilee overinght.  WBC 15.1 K today  Review of Systems: Review of Systems  All other systems reviewed and are negative.    Scheduled Meds:  amLODipine  5 mg Oral Daily   aspirin EC  81 mg Oral Daily   calcitRIOL  2 mcg Oral Once per day on Tuesday Saturday   Chlorhexidine Gluconate Cloth  6 each Topical Q0600   dextromethorphan-guaiFENesin  1 tablet Oral BID   feeding supplement  237 mL Oral BID BM   hydrALAZINE  50 mg Oral TID   methylPREDNISolone (SOLU-MEDROL) injection  40 mg Intravenous Q12H   multivitamin  1 tablet Oral  QHS   pantoprazole  40 mg Oral Daily   sodium chloride flush  10 mL Intravenous Q12H   Continuous Infusions:   ceFAZolin (ANCEF) IV 1 g (01/11/23 1635)   PRN Meds:.acetaminophen, fentaNYL (SUBLIMAZE) injection, ipratropium-albuterol, ondansetron **OR** ondansetron (ZOFRAN) IV Allergies  Allergen Reactions   Clonidine Derivatives Nausea And Vomiting and Other (See Comments)    Tablets by mouth = Tolerated  Patches on the skin = Nausea & vomiting after wearing 4-5 days    Imdur [Isosorbide Nitrate] Other (See Comments)    Headaches -- patient instructed to continue taking    Tramadol Other (See Comments)    Headaches     OBJECTIVE: Vitals:   01/12/23 1300 01/12/23 1314 01/12/23 1318 01/12/23 1406  BP: (!) 148/77 138/75  (!) 140/67  Pulse: 78   75  Resp: 16 16  18   Temp:  97.7 F (36.5 C)  97.6 F (36.4 C)  TempSrc:  Oral  Oral  SpO2:  96%  98%  Weight:   61.1 kg   Height:       Body mass index is 18.79 kg/m.  Physical Exam Constitutional:      General: He is not in acute distress.    Appearance: He is normal weight. He is not toxic-appearing.  HENT:     Head: Normocephalic and atraumatic.     Right Ear: External ear normal.     Left Ear: External ear normal.     Nose: No congestion or rhinorrhea.  Mouth/Throat:     Mouth: Mucous membranes are moist.     Pharynx: Oropharynx is clear.  Eyes:     Extraocular Movements: Extraocular movements intact.     Conjunctiva/sclera: Conjunctivae normal.     Pupils: Pupils are equal, round, and reactive to light.  Cardiovascular:     Rate and Rhythm: Normal rate and regular rhythm.     Heart sounds: No murmur heard.    No friction rub. No gallop.  Pulmonary:     Effort: Pulmonary effort is normal.     Breath sounds: Normal breath sounds.  Abdominal:     General: Abdomen is flat. Bowel sounds are normal.     Palpations: Abdomen is soft.  Musculoskeletal:        General: No swelling. Normal range of motion.      Cervical back: Normal range of motion and neck supple.  Skin:    General: Skin is warm and dry.  Neurological:     General: No focal deficit present.     Mental Status: He is oriented to person, place, and time.  Psychiatric:        Mood and Affect: Mood normal.       Lab Results Lab Results  Component Value Date   WBC 15.1 (H) 01/12/2023   HGB 10.3 (L) 01/12/2023   HCT 30.7 (L) 01/12/2023   MCV 93.6 01/12/2023   PLT 51 (L) 01/12/2023    Lab Results  Component Value Date   CREATININE 11.45 (H) 01/12/2023   BUN 147 (H) 01/12/2023   NA 132 (L) 01/12/2023   K 4.4 01/12/2023   CL 97 (L) 01/12/2023   CO2 20 (L) 01/12/2023    Lab Results  Component Value Date   ALT 19 01/10/2023   AST 32 01/10/2023   ALKPHOS 42 01/10/2023   BILITOT 1.0 01/10/2023        Danelle Earthly, MD [facility] 01/12/2023, 3:00 PM I have personally spent 51 minutes involved in face-to-face and non-face-to-face activities for this patient on the day of the visit. Professional time spent includes the following activities: Preparing to see the patient (review of tests), Obtaining and/or reviewing separately obtained history (admission/discharge record), Performing a medically appropriate examination and/or evaluation , Ordering medications/tests/procedures, referring and communicating with other health care professionals, Documenting clinical information in the EMR, Independently interpreting results (not separately reported), Communicating results to the patient/family/caregiver, Counseling and educating the patient/family/caregiver and Care coordination (not separately reported).

## 2023-01-13 ENCOUNTER — Inpatient Hospital Stay (HOSPITAL_COMMUNITY): Payer: 59

## 2023-01-13 ENCOUNTER — Encounter (HOSPITAL_COMMUNITY)
Admission: EM | Disposition: A | Discharge status: Home / Self Care | Payer: Self-pay | Source: Home / Self Care | Attending: Family Medicine

## 2023-01-13 ENCOUNTER — Encounter (HOSPITAL_COMMUNITY): Payer: Self-pay | Admitting: Cardiology

## 2023-01-13 DIAGNOSIS — I340 Nonrheumatic mitral (valve) insufficiency: Secondary | ICD-10-CM | POA: Diagnosis not present

## 2023-01-13 DIAGNOSIS — R7881 Bacteremia: Secondary | ICD-10-CM

## 2023-01-13 DIAGNOSIS — R509 Fever, unspecified: Secondary | ICD-10-CM | POA: Diagnosis not present

## 2023-01-13 HISTORY — PX: TRANSESOPHAGEAL ECHOCARDIOGRAM (CATH LAB): EP1270

## 2023-01-13 LAB — CBC WITH DIFFERENTIAL/PLATELET
Abs Immature Granulocytes: 0.4 10*3/uL — ABNORMAL HIGH (ref 0.00–0.07)
Basophils Absolute: 0 10*3/uL (ref 0.0–0.1)
Basophils Relative: 0 %
Eosinophils Absolute: 0 10*3/uL (ref 0.0–0.5)
Eosinophils Relative: 0 %
HCT: 30.4 % — ABNORMAL LOW (ref 39.0–52.0)
Hemoglobin: 10.3 g/dL — ABNORMAL LOW (ref 13.0–17.0)
Immature Granulocytes: 4 %
Lymphocytes Relative: 13 %
Lymphs Abs: 1.5 10*3/uL (ref 0.7–4.0)
MCH: 31.2 pg (ref 26.0–34.0)
MCHC: 33.9 g/dL (ref 30.0–36.0)
MCV: 92.1 fL (ref 80.0–100.0)
Monocytes Absolute: 1.4 10*3/uL — ABNORMAL HIGH (ref 0.1–1.0)
Monocytes Relative: 12 %
Neutro Abs: 8.2 10*3/uL — ABNORMAL HIGH (ref 1.7–7.7)
Neutrophils Relative %: 71 %
Platelets: 75 10*3/uL — ABNORMAL LOW (ref 150–400)
RBC: 3.3 MIL/uL — ABNORMAL LOW (ref 4.22–5.81)
RDW: 14.4 % (ref 11.5–15.5)
Smear Review: NORMAL
WBC: 11.5 10*3/uL — ABNORMAL HIGH (ref 4.0–10.5)
nRBC: 0 % (ref 0.0–0.2)

## 2023-01-13 LAB — CULTURE, BLOOD (ROUTINE X 2)

## 2023-01-13 LAB — PATHOLOGIST SMEAR REVIEW

## 2023-01-13 LAB — VITAMIN B12: Vitamin B-12: 703 pg/mL (ref 180–914)

## 2023-01-13 LAB — ECHO TEE

## 2023-01-13 LAB — IMMATURE PLATELET FRACTION: Immature Platelet Fraction: 10.3 % — ABNORMAL HIGH (ref 1.2–8.6)

## 2023-01-13 IMAGING — CR DG CHEST 2V
2 series · 2 of 2 positions shown · non-contrast
Comparison: Prior chest radiographs 05/15/2020.

CLINICAL DATA: Chest pain. Additional history provided: Patient
reports chest pain which began several days ago, history of 2
previous myocardial infarctions. Patient reports [DATE] pain.

EXAM:
CHEST - 2 VIEW

[w chest pa]
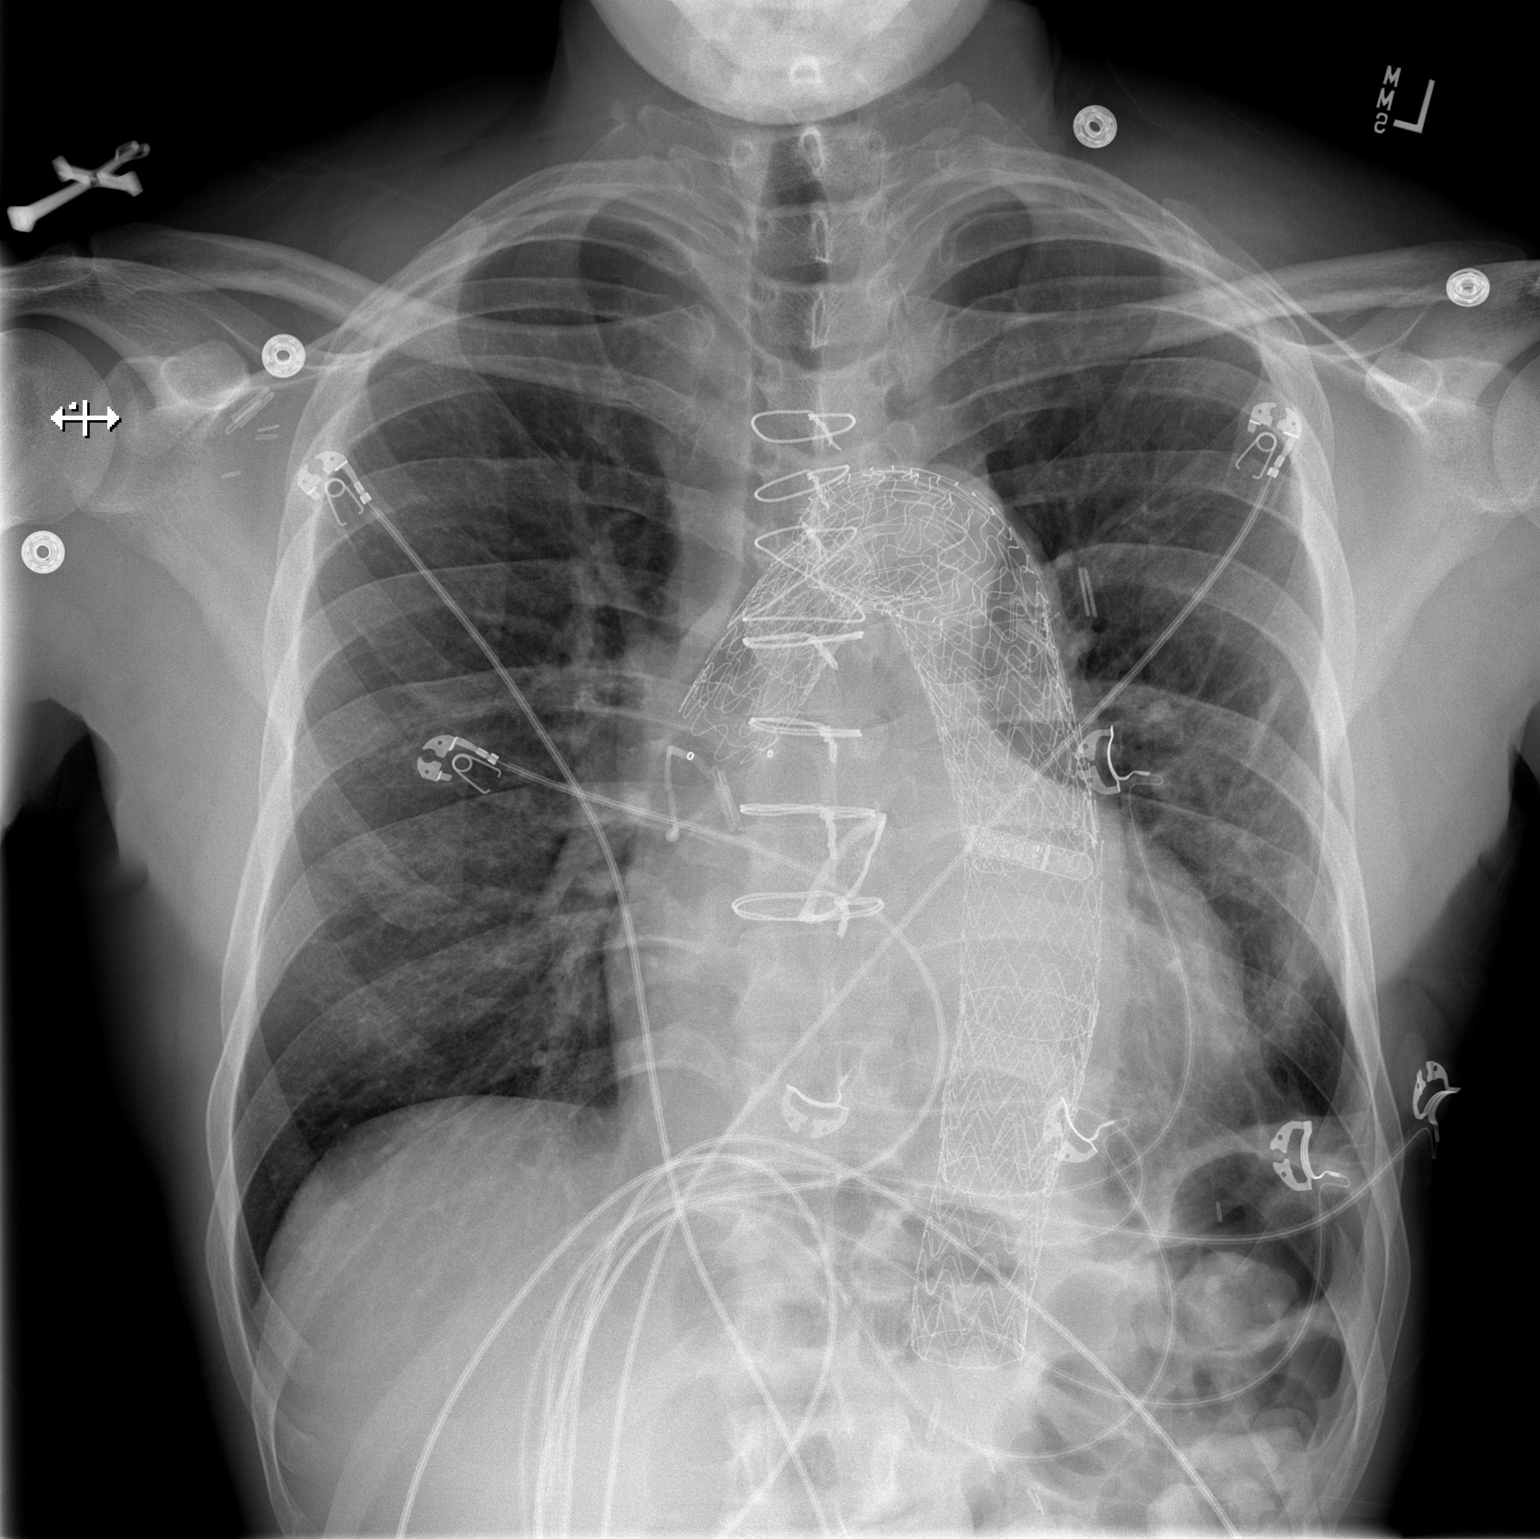

[w chest lat]
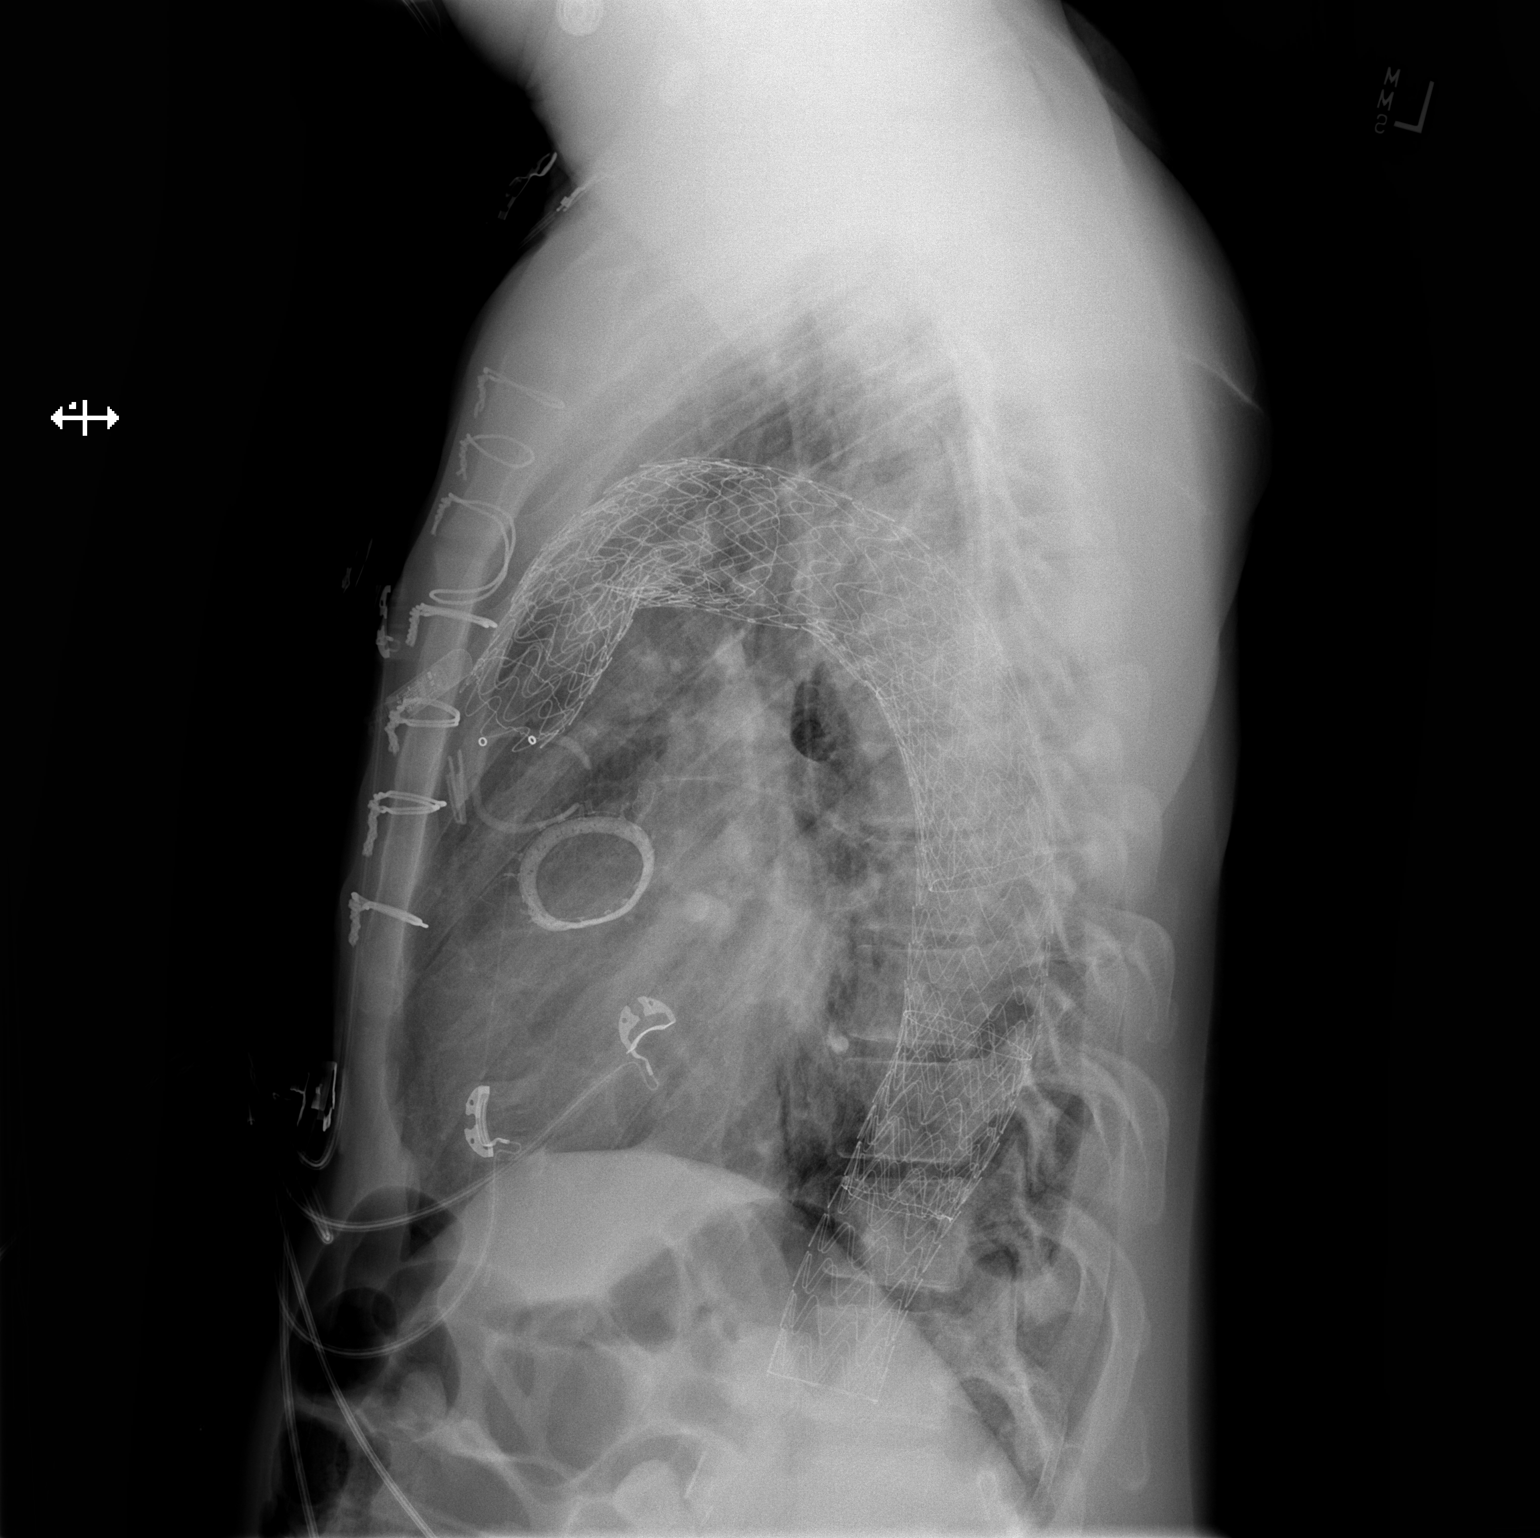

[2 of 2 positions shown; findings below may reference images not displayed]

FINDINGS: Prior median sternotomy with redemonstrated aortic stent graft.
Prior aortic valve replacement. A loop recorder device projects over
the left chest. Unchanged cardiomegaly. No appreciable airspace
consolidation or pulmonary edema. No evidence of pleural effusion or
pneumothorax. No acute bony abnormality identified. Surgical clips
project in the region of the right axilla.
IMPRESSION: No evidence of acute cardiopulmonary abnormality.

Redemonstrated postsurgical changes, as described.

Unchanged cardiomegaly.

## 2023-01-13 SURGERY — TRANSESOPHAGEAL ECHOCARDIOGRAM (TEE) (CATHLAB)
Anesthesia: Monitor Anesthesia Care

## 2023-01-13 MED ORDER — SODIUM CHLORIDE 0.9 % IV SOLN: INTRAVENOUS | Status: DC | PRN

## 2023-01-13 MED ORDER — LIDOCAINE 2% (20 MG/ML) 5 ML SYRINGE
INTRAMUSCULAR | Status: DC | PRN
  Administered 2023-01-13: 100 mg via INTRAVENOUS

## 2023-01-13 MED ORDER — PHENYLEPHRINE HCL-NACL 20-0.9 MG/250ML-% IV SOLN
INTRAVENOUS | Status: DC | PRN
  Administered 2023-01-13: 60 ug/min via INTRAVENOUS

## 2023-01-13 MED ORDER — CHLORHEXIDINE GLUCONATE CLOTH 2 % EX PADS: 6.0000 | MEDICATED_PAD | Freq: Every day | CUTANEOUS | Status: DC

## 2023-01-13 MED ORDER — CYCLOBENZAPRINE HCL 5 MG PO TABS: 7.5000 mg | ORAL_TABLET | Freq: Three times a day (TID) | ORAL | Status: DC | PRN

## 2023-01-13 MED ORDER — PROPOFOL 500 MG/50ML IV EMUL
INTRAVENOUS | Status: DC | PRN
  Administered 2023-01-13: 125 ug/kg/min via INTRAVENOUS

## 2023-01-13 MED ORDER — FENTANYL CITRATE PF 50 MCG/ML IJ SOSY
25.0000 ug | PREFILLED_SYRINGE | INTRAMUSCULAR | Status: DC | PRN
  Administered 2023-01-13 – 2023-01-14 (×5): 25 ug via INTRAVENOUS
  Filled 2023-01-13 (×6): qty 1

## 2023-01-13 MED ORDER — PROPOFOL 10 MG/ML IV BOLUS
INTRAVENOUS | Status: DC | PRN
  Administered 2023-01-13: 70 mg via INTRAVENOUS
  Administered 2023-01-13 (×2): 30 mg via INTRAVENOUS

## 2023-01-13 NOTE — Progress Notes (Signed)
Hematology    Latest Ref Rng & Units 01/13/2023    7:48 AM 01/12/2023    9:25 AM 01/10/2023    6:08 AM  CBC  WBC 4.0 - 10.5 K/uL 11.5  15.1  8.4   Hemoglobin 13.0 - 17.0 g/dL 08.6  57.8  46.9   Hematocrit 39.0 - 52.0 % 30.4  30.7  31.7   Platelets 150 - 400 K/uL 75  51  34    Thromobocytopenia: with Hgh IPF suggesting Bone marrow recovery. Recovering well. No need of additional tests or work up at this time

## 2023-01-13 NOTE — Progress Notes (Signed)
Advised by nephrologist that pt may be stable for d/c tomorrow. Inpt HD ordered for 2nd shift tomorrow in case pt stable for d/c in the am and can get to out-pt clinic for treatment to avoid day of d/c HD. Contacted [facility] and they can treat pt tomorrow. Pt would need to arrive at 12:20 for 12:35 chair time. Update provided to nephrologist and attending. Will f/u with attending in the am to see if pt stable for d/c in order to get to out-pt HD. If so, will discuss wit pt. Clinic manger confirms pt can treat 3x's a week (TTS 6:50 am chair time) at d/c and clinic advised pt will need iv abx at d/c. Will assist as needed.   Olivia Canter Renal Navigator [phone]

## 2023-01-13 NOTE — Transfer of Care (Signed)
Immediate Anesthesia Transfer of Care Note  Patient: [name]  Procedure(s) Performed: TRANSESOPHAGEAL ECHOCARDIOGRAM  Patient Location: PACU and Cath Lab  Anesthesia Type:MAC  Level of Consciousness: sedated  Airway & Oxygen Therapy: Patient Spontanous Breathing and Patient connected to nasal cannula oxygen  Post-op Assessment: Report given to RN and Post -op Vital signs reviewed and stable  Post vital signs: Reviewed and stable  Last Vitals:  Vitals Value Taken Time  BP    Temp    Pulse    Resp    SpO2      Last Pain:  Vitals:   01/13/23 0903  TempSrc: Temporal  PainSc:       Patients Stated Pain Goal: 0 (01/11/23 1638)  Complications: There were no known notable events for this encounter.

## 2023-01-13 NOTE — CV Procedure (Signed)
     PROCEDURE NOTE:  Procedure:  Transesophageal echocardiogram Operator:  Armanda Magic, MD Indications:  Bacteremia Complications: None  During this procedure the patient is administered a total of Propofol 245 mg to achieve and maintain moderate conscious sedation. He also was given 100mg  Lidocaine and of phenylephrine. The patient's heart rate, blood pressure, and oxygen saturation are monitored continuously during the procedure by anesthesia.   Results: Normal LV size and function with severe LVH Normal RV size and function Moderately dilated RA Severely dilated LA with no evidence of thrombus in the LA or LA appendage Normal TV with mild TR Normal PV with trivial PR Normal MV with moderate MR Bioprosthetic AV with thickened cusps and significantly reduced leaflet motion.  There is at least moderate aortic stenosis visually with trivial AR.  Images were sub optimal due to shadowing from Aortic root repair. Normal interatrial septum with no evidence of shunt by colorflow dopper  S/P aortic root repair  The patient tolerated the procedure well and was transferred back to their room in stable condition.  Signed: Armanda Magic, MD [facility]

## 2023-01-13 NOTE — Anesthesia Postprocedure Evaluation (Signed)
Anesthesia Post Note  Patient: ZAHKAI MERSHON  Procedure(s) Performed: TRANSESOPHAGEAL ECHOCARDIOGRAM     Patient location during evaluation: PACU Anesthesia Type: MAC Level of consciousness: awake and alert Pain management: pain level controlled Vital Signs Assessment: post-procedure vital signs reviewed and stable Respiratory status: spontaneous breathing, nonlabored ventilation, respiratory function stable and patient connected to nasal cannula oxygen Cardiovascular status: stable and blood pressure returned to baseline Postop Assessment: no apparent nausea or vomiting Anesthetic complications: no   There were no known notable events for this encounter.  Last Vitals:  Vitals:   01/13/23 1029 01/13/23 1030  BP: (!) 167/89 (!) 167/89  Pulse: 72 74  Resp:    Temp:    SpO2: 100% 100%    Last Pain:  Vitals:   01/13/23 1005  TempSrc: Temporal  PainSc: 0-No pain                 Mariann Barter

## 2023-01-13 NOTE — Anesthesia Procedure Notes (Signed)
Date/Time: 01/13/2023 9:38 AM  Performed by: Debbe Odea, CRNAOxygen Delivery Method: Nasal cannula Induction Type: IV induction

## 2023-01-13 NOTE — Progress Notes (Signed)
[facility] Progress Note  Subjective: seen in room, feels better overall  Vitals:   01/13/23 1020 01/13/23 1025 01/13/23 1029 01/13/23 1030  BP:  (!) 158/85 (!) 167/89 (!) 167/89  Pulse: 74 72 72 74  Resp:      Temp:      TempSrc:      SpO2: 100% 99% 100% 100%  Weight:      Height:        Exam: General: Well developed, well nourished, in no acute distress. Room air. Lungs: Clear bilaterally to auscultation Breathing is unlabored. Heart: Tachycardic with 5/6 systolic murmur with click Abdomen: Soft, non-tender, non-distended with normoactive bowel sounds. Musculoskeletal:  Strength and tone appear normal for age. Lower extremities: No edema or ischemic changes Neuro: Alert and oriented X 3.  Dialysis Access: L AVF + bruit     Renal-related home meds: - clonidine 0.1 bid - percocet prn - norvasc 5 every day - hydralazine 50 tid    OP HD access history: [facility] 02/2021: Angioplasty L BC vein stenosis, angioplasty L subclavian vein, angioplasty L basilic vein transposition. [facility] 07/2021: Angioplasty L BC vein stenosis. [facility] 12/25/22: Angioplasty L BC vein with stenting   [facility] HD: [facility] - last HD 10/22  4h   500/800   63.7kg   2/2 bath  P4   LUA AVF  Heparin 3000 - Mircera Iv q 4 weeks, last 10/15, due 11/05 - Calcitriol PO q HD - Venofer 50mg  IV weekly (tsat 24% on 10/15)   CXR 10/26 - no active disease   Assessment/Plan: Coag neg staph bacteremia - +blood cx's 10/26, temp 103 on admission. On IV Vanc/Cefepime/Flagyl. Of note, he essentially only comes for HD 1d/week so cannot plan for 3d/week antibiotic course. TEE was negative. ID recommending IV ancef x 6 weeks thru 02/22/23. Pt states he will go to HD 3x per week and is aware of the importance of these IV abx as rx for his blood infection.   Chest pain: For 3 weeks - troponin elevated, but stable. Per primary.  ESRD:  Usual sched Tues/Sat. Now changing to TTS as above. HD tomorrow.    Hypertension/volume: BP up slightly, continue home meds. Has been losing weight, below prior EDW. Euvolemic on exam. Keep even next HD.   Anemia: Hgb 10.6, next esa due 11/05. Follow.  Metabolic bone disease: CCa and phos are in range. Cont po vdra w/ hd   Nutrition:  Alb 3.1 - adding protein supplements.  Weight loss: has been having fevers for the last month he says today. Wt loss probably due to infection, but can't r/o uremia also w/ creat 10 and once weekly dialysis. Is not grossly uremic on exam today.   Thrombocytopenia - in the 30- 50k range here, no bleeding noted. Holding heparin w/ HD. Up to 50s today.  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 Inpatient medications:  amLODipine  5 mg Oral Daily   aspirin EC  81 mg Oral Daily   calcitRIOL  2 mcg Oral Once per day on Tuesday Saturday   Chlorhexidine Gluconate Cloth  6 each Topical Q0600   dextromethorphan-guaiFENesin  1 tablet Oral BID   feeding supplement  237 mL Oral BID BM   hydrALAZINE  50 mg Oral TID   multivitamin  1 tablet Oral QHS   pantoprazole  40 mg Oral Daily   predniSONE  40 mg Oral Q breakfast   sodium chloride flush  10 mL Intravenous Q12H     ceFAZolin (ANCEF) IV 1 g (01/12/23 1631)   acetaminophen, bisacodyl, cyclobenzaprine, fentaNYL (SUBLIMAZE) injection, ipratropium-albuterol, ondansetron **OR** ondansetron (ZOFRAN) IV, senna-docusate

## 2023-01-13 NOTE — Progress Notes (Signed)
PROGRESS NOTE    [name]  ZOX:[id number] DOB: [birth year] DOA: 01/09/2023 PCP: Loura Back, NP  52 y.o. male with medical history significant of hypertension, hyperlipidemia, CAD s/p CABG,  2 MIs, 3 CVAs, ESRD on hemodialysis once a week, on Tuesdays (patient still forms urine), descending thoracic aortic aneurysm, history of aortic valve replacement with bioprosthetic valve on statin and aspirin daily, severe left jugular venous distention for the past 8 months, tobacco use disorder who presents to the emergency department due to 2-week onset of midsternal chest pain described as pressure-like with radiation to both sides of the neck and bilateral arms.  -Admitted, noted to have Staph epidermidis bacteremia, worsening thrombocytopenia -Echo also noted severe stenosis of prosthetic aortic valve   Subjective: Patient seen and examined, Dr. Daune Perch notes and consultant notes reviewed from this week -Reports feeling okay, some chronic intermittent lower back pain  Assessment and Plan:  Fever/bacteremia, Sepsis PoA -Blood culture growing Staph epidermidis, started on vancomycin -ID changed antibiotics to cefazolin -TTE showed no valvular vegetations -ID consulted, recommend TEE, plan for today -Platelet counts pending this morning   Atypical chest pain -Troponin was elevated to 300, 400 -Cardiology was consulted last night, did not recommend any intervention -Aspirin 324 mg p.o. x 1 and and nitroglycerin 0.4 mg sublingual x 1 was given. Continue aspirin 81 mg p.o. daily; Patient does not want nitroglycerin -Cardiology consulted   Severe aortic stenosis -Echocardiogram shows severe stenosis of prosthetic aortic valve -TEE scheduled for today   Acute bronchitis -Patient says he has been coughing up yellow phlegm -Rx w/ DuoNebs, Mucinex, Solu-Medrol -Prednisone taper, supportive care   CAD status post CABG -Continue aspirin   Hypertension -Continue amlodipine, hydralazine    Hyperlipidemia -Continue Repatha every 14 days   ESRD, on hemodialysis -Patient gets hemodialyzed once a week, still makes urine   GERD -Continue Protonix   Thrombocytopenia -Chronic thrombocytopenia with baseline around 80,000 -Acute worsening likely in the setting of sepsis, bacteremia -Prescient hematology input, labs pending this morning, at 50K would be reasonable to have TEE    DVT prophylaxis: SCDs Code Status: Full code Family Communication: None present Disposition Plan: Home pending above workup  Consultants: Cards, nephrology   Procedures:   Antimicrobials:    Objective: Vitals:   01/13/23 0021 01/13/23 0358 01/13/23 0547 01/13/23 0735  BP: (!) 143/82  (!) 159/85 (!) 150/81  Pulse: 82  79 77  Resp: 18  20 18   Temp: (!) 97.4 F (36.3 C)  98.1 F (36.7 C) 97.7 F (36.5 C)  TempSrc: Oral  Oral Oral  SpO2: 99%  98% 99%  Weight:  65.7 kg    Height:        Intake/Output Summary (Last 24 hours) at 01/13/2023 0802 Last data filed at 01/12/2023 2106 Gross per 24 hour  Intake 390 ml  Output 0 ml  Net 390 ml   Filed Weights   01/12/23 0957 01/12/23 1318 01/13/23 0358  Weight: 61.1 kg 61.1 kg 65.7 kg    Examination:  General exam: Appears calm and comfortable  Respiratory system: Clear to auscultation Cardiovascular system: S1-S2, regular rhythm, systolic murmur Abd: nondistended, soft and nontender.Normal bowel sounds heard. Central nervous system: Alert and oriented. No focal neurological deficits. Extremities: no edema Skin: No rashes Psychiatry:  Mood & affect appropriate.     Data Reviewed:   CBC: Recent Labs  Lab 01/09/23 1539 01/10/23 0608 01/12/23 0925  WBC 6.6 8.4 15.1*  NEUTROABS 5.7  --   --  HGB 13.4 10.6* 10.3*  HCT 40.7 31.7* 30.7*  MCV 97.6 92.4 93.6  PLT 45* 34* 51*   Basic Metabolic Panel: Recent Labs  Lab 01/09/23 1539 01/10/23 0608 01/11/23 1257 01/12/23 0925  NA 132* 134*  --  132*  K 3.7 3.9  --  4.4   CL 96* 98  --  97*  CO2 22 20*  --  20*  GLUCOSE 98 128*  --  167*  BUN 73* 80*  --  147*  CREATININE 10.07* 10.64*  --  11.45*  CALCIUM 9.9 9.4  --  10.3  MG  --  1.8  --   --   PHOS  --   --  4.8* 5.5*   GFR: Estimated Creatinine Clearance: 7.2 mL/min (A) (by C-G formula based on SCr of 11.45 mg/dL (H)). Liver Function Tests: Recent Labs  Lab 01/09/23 1539 01/10/23 0608 01/12/23 0925  AST 39 32  --   ALT 23 19  --   ALKPHOS 49 42  --   BILITOT 1.2 1.0  --   PROT 8.1 6.2*  --   ALBUMIN 4.2 3.1* 2.9*   No results for input(s): "LIPASE", "AMYLASE" in the last 168 hours. No results for input(s): "AMMONIA" in the last 168 hours. Coagulation Profile: Recent Labs  Lab 01/09/23 1539  INR 1.3*   Cardiac Enzymes: No results for input(s): "CKTOTAL", "CKMB", "CKMBINDEX", "TROPONINI" in the last 168 hours. BNP (last 3 results) No results for input(s): "PROBNP" in the last 8760 hours. HbA1C: No results for input(s): "HGBA1C" in the last 72 hours. CBG: No results for input(s): "GLUCAP" in the last 168 hours. Lipid Profile: No results for input(s): "CHOL", "HDL", "LDLCALC", "TRIG", "CHOLHDL", "LDLDIRECT" in the last 72 hours. Thyroid Function Tests: No results for input(s): "TSH", "T4TOTAL", "FREET4", "T3FREE", "THYROIDAB" in the last 72 hours. Anemia Panel: No results for input(s): "VITAMINB12", "FOLATE", "FERRITIN", "TIBC", "IRON", "RETICCTPCT" in the last 72 hours. Urine analysis:    Component Value Date/Time   COLORURINE YELLOW 01/11/2023 2215   APPEARANCEUR CLOUDY (A) 01/11/2023 2215   LABSPEC 1.017 01/11/2023 2215   PHURINE 5.0 01/11/2023 2215   GLUCOSEU 50 (A) 01/11/2023 2215   HGBUR MODERATE (A) 01/11/2023 2215   HGBUR negative 02/18/2009 0901   BILIRUBINUR NEGATIVE 01/11/2023 2215   KETONESUR NEGATIVE 01/11/2023 2215   PROTEINUR >=300 (A) 01/11/2023 2215   UROBILINOGEN 0.2 01/02/2015 1945   NITRITE NEGATIVE 01/11/2023 2215   LEUKOCYTESUR NEGATIVE 01/11/2023  2215   Sepsis Labs: @LABRCNTIP (procalcitonin:4,lacticidven:4)  ) Recent Results (from the past 240 hour(s))  Blood Culture (routine x 2)     Status: Abnormal   Collection Time: 01/09/23  3:39 PM   Specimen: BLOOD RIGHT ARM  Result Value Ref Range Status   Specimen Description   Final    BLOOD RIGHT ARM Performed at [facility], [address] [place], [place] [address]    Special Requests   Final    BOTTLES DRAWN AEROBIC AND ANAEROBIC Blood Culture adequate volume Performed at [facility], [address], [place], [place] [address]    Culture  Setup Time   Final    GRAM POSITIVE COCCI IN BOTH AEROBIC AND ANAEROBIC BOTTLES CRITICAL RESULT CALLED TO, READ BACK BY AND VERIFIED WITH: DERWOOD PANG [id number] AT 0858 BY EC Performed at [facility], [address], [place], [place] [address]    Culture STAPHYLOCOCCUS EPIDERMIDIS (A)  Final   Report Status 01/12/2023 FINAL  Final  Organism ID, Bacteria STAPHYLOCOCCUS EPIDERMIDIS  Final      Susceptibility   Staphylococcus epidermidis - MIC*    CIPROFLOXACIN <=0.5 SENSITIVE Sensitive     ERYTHROMYCIN >=8 RESISTANT Resistant     GENTAMICIN <=0.5 SENSITIVE Sensitive     OXACILLIN <=0.25 SENSITIVE Sensitive     TETRACYCLINE >=16 RESISTANT Resistant     VANCOMYCIN 1 SENSITIVE Sensitive     TRIMETH/SULFA <=10 SENSITIVE Sensitive     CLINDAMYCIN <=0.25 SENSITIVE Sensitive     RIFAMPIN <=0.5 SENSITIVE Sensitive     Inducible Clindamycin NEGATIVE Sensitive     * STAPHYLOCOCCUS EPIDERMIDIS  Blood Culture (routine x 2)     Status: Abnormal (Preliminary result)   Collection Time: 01/09/23  3:39 PM   Specimen: BLOOD RIGHT ARM  Result Value Ref Range Status   Specimen Description   Final    BLOOD RIGHT ARM Performed at [facility], [address] [place], [place] [address]    Special Requests   Final    BOTTLES DRAWN AEROBIC AND ANAEROBIC Blood Culture results may not be optimal due to an  excessive volume of blood received in culture bottles Performed at [facility], [address] [place], [place] [address]    Culture  Setup Time   Final    GRAM POSITIVE COCCI IN BOTH AEROBIC AND ANAEROBIC BOTTLES CRITICAL VALUE NOTED.  VALUE IS CONSISTENT WITH PREVIOUSLY REPORTED AND CALLED VALUE.    Culture (A)  Final    STAPHYLOCOCCUS EPIDERMIDIS SUSCEPTIBILITIES PERFORMED ON PREVIOUS CULTURE WITHIN THE LAST 5 DAYS. Performed at [facility], [address] [place], [place] [address]    Report Status PENDING  Incomplete  Resp panel by RT-PCR (RSV, Flu A&B, Covid)     Status: None   Collection Time: 01/09/23  3:39 PM   Specimen: Nasal Swab  Result Value Ref Range Status   SARS Coronavirus 2 by RT PCR NEGATIVE NEGATIVE Final    Comment: (NOTE) SARS-CoV-2 target nucleic acids are NOT DETECTED.  The SARS-CoV-2 RNA is generally detectable in upper respiratory specimens during the acute phase of infection. The lowest concentration of SARS-CoV-2 viral copies this assay can detect is 138 copies/mL. A negative result does not preclude SARS-Cov-2 infection and should not be used as the sole basis for treatment or other patient management decisions. A negative result may occur with  improper specimen collection/handling, submission of specimen other than nasopharyngeal swab, presence of viral mutation(s) within the areas targeted by this assay, and inadequate number of viral copies(<138 copies/mL). A negative result must be combined with clinical observations, patient history, and epidemiological information. The expected result is Negative.  Fact Sheet for Patients:  BloggerCourse.com  Fact Sheet for Healthcare Providers:  SeriousBroker.it  This test is no t yet approved or cleared by the [place] FDA and  has been authorized for detection and/or diagnosis of SARS-CoV-2 by FDA under an Emergency Use  Authorization (EUA). This EUA will remain  in effect (meaning this test can be used) for the duration of the COVID-19 declaration under Section 564(b)(1) of the Act, 21 U.S.C.section 360bbb-3(b)(1), unless the authorization is terminated  or revoked sooner.       Influenza A by PCR NEGATIVE NEGATIVE Final   Influenza B by PCR NEGATIVE NEGATIVE Final    Comment: (NOTE) The Xpert Xpress SARS-CoV-2/FLU/RSV plus assay is intended as an aid in the diagnosis of influenza from Nasopharyngeal swab specimens and should not be used as a sole  basis for treatment. Nasal washings and aspirates are unacceptable for Xpert Xpress SARS-CoV-2/FLU/RSV testing.  Fact Sheet for Patients: BloggerCourse.com  Fact Sheet for Healthcare Providers: SeriousBroker.it  This test is not yet approved or cleared by the [place] FDA and has been authorized for detection and/or diagnosis of SARS-CoV-2 by FDA under an Emergency Use Authorization (EUA). This EUA will remain in effect (meaning this test can be used) for the duration of the COVID-19 declaration under Section 564(b)(1) of the Act, 21 U.S.C. section 360bbb-3(b)(1), unless the authorization is terminated or revoked.     Resp Syncytial Virus by PCR NEGATIVE NEGATIVE Final    Comment: (NOTE) Fact Sheet for Patients: BloggerCourse.com  Fact Sheet for Healthcare Providers: SeriousBroker.it  This test is not yet approved or cleared by the [place] FDA and has been authorized for detection and/or diagnosis of SARS-CoV-2 by FDA under an Emergency Use Authorization (EUA). This EUA will remain in effect (meaning this test can be used) for the duration of the COVID-19 declaration under Section 564(b)(1) of the Act, 21 U.S.C. section 360bbb-3(b)(1), unless the authorization is terminated or revoked.  Performed at [facility],  [address] [place], [place] [address]   Blood Culture ID Panel (Reflexed)     Status: Abnormal   Collection Time: 01/09/23  3:39 PM  Result Value Ref Range Status   Enterococcus faecalis NOT DETECTED NOT DETECTED Final   Enterococcus Faecium NOT DETECTED NOT DETECTED Final   Listeria monocytogenes NOT DETECTED NOT DETECTED Final   Staphylococcus species DETECTED (A) NOT DETECTED Final    Comment: CRITICAL RESULT CALLED TO, READ BACK BY AND VERIFIED WITH: PHARMD CARLA JARDIN [phone] AT 0858 BY EC    Staphylococcus aureus (BCID) NOT DETECTED NOT DETECTED Final   Staphylococcus epidermidis DETECTED (A) NOT DETECTED Final    Comment: CRITICAL RESULT CALLED TO, READ BACK BY AND VERIFIED WITH: PHARMD CARLA JARDIN [phone] AT 0858 BY EC    Staphylococcus lugdunensis NOT DETECTED NOT DETECTED Final   Streptococcus species NOT DETECTED NOT DETECTED Final   Streptococcus agalactiae NOT DETECTED NOT DETECTED Final   Streptococcus pneumoniae NOT DETECTED NOT DETECTED Final   Streptococcus pyogenes NOT DETECTED NOT DETECTED Final   A.calcoaceticus-baumannii NOT DETECTED NOT DETECTED Final   Bacteroides fragilis NOT DETECTED NOT DETECTED Final   Enterobacterales NOT DETECTED NOT DETECTED Final   Enterobacter cloacae complex NOT DETECTED NOT DETECTED Final   Escherichia coli NOT DETECTED NOT DETECTED Final   Klebsiella aerogenes NOT DETECTED NOT DETECTED Final   Klebsiella oxytoca NOT DETECTED NOT DETECTED Final   Klebsiella pneumoniae NOT DETECTED NOT DETECTED Final   Proteus species NOT DETECTED NOT DETECTED Final   Salmonella species NOT DETECTED NOT DETECTED Final   Serratia marcescens NOT DETECTED NOT DETECTED Final   Haemophilus influenzae NOT DETECTED NOT DETECTED Final   Neisseria meningitidis NOT DETECTED NOT DETECTED Final   Pseudomonas aeruginosa NOT DETECTED NOT DETECTED Final   Stenotrophomonas maltophilia NOT DETECTED NOT DETECTED Final   Candida albicans NOT DETECTED NOT  DETECTED Final   Candida auris NOT DETECTED NOT DETECTED Final   Candida glabrata NOT DETECTED NOT DETECTED Final   Candida krusei NOT DETECTED NOT DETECTED Final   Candida parapsilosis NOT DETECTED NOT DETECTED Final   Candida tropicalis NOT DETECTED NOT DETECTED Final   Cryptococcus neoformans/gattii NOT DETECTED NOT DETECTED Final   Methicillin resistance mecA/C NOT DETECTED NOT DETECTED Final    Comment: Performed at [facility], [address], [place], [place] [address]  Culture, blood (Routine X 2) w Reflex to ID Panel     Status: None (Preliminary result)   Collection Time: 01/11/23  6:11 AM   Specimen: BLOOD  Result Value Ref Range Status   Specimen Description BLOOD BLOOD RIGHT ARM  Final   Special Requests   Final    BOTTLES DRAWN AEROBIC AND ANAEROBIC Blood Culture adequate volume   Culture   Final    NO GROWTH 2 DAYS Performed at [facility], [address], [place], [place] [address]    Report Status PENDING  Incomplete  Culture, blood (Routine X 2) w Reflex to ID Panel     Status: None (Preliminary result)   Collection Time: 01/11/23  6:17 AM   Specimen: BLOOD  Result Value Ref Range Status   Specimen Description BLOOD BLOOD RIGHT HAND  Final   Special Requests   Final    BOTTLES DRAWN AEROBIC AND ANAEROBIC Blood Culture adequate volume   Culture   Final    NO GROWTH 2 DAYS Performed at [facility], [address], [place], [place] [address]    Report Status PENDING  Incomplete     Radiology Studies: MR LUMBAR SPINE WO CONTRAST  Result Date: 01/11/2023 CLINICAL DATA:  infection EXAM: MRI LUMBAR SPINE WITHOUT CONTRAST TECHNIQUE: Multiplanar, multisequence MR imaging of the lumbar spine was performed. No intravenous contrast was administered. COMPARISON:  None Available. FINDINGS: Segmentation:  Standard. Alignment:  Physiologic. Vertebrae: No abnormal bone marrow edema to suggest discitis/osteomyelitis. Diffusely low STIR and T2 signal is  likely related to patient's iron injection. Conus medullaris and cauda equina: Conus extends to the T12 level. Conus and cauda equina appear normal. Paraspinal and other soft tissues: No appreciable paraspinal edema. Known infrarenal abdominal aortic aneurysm better characterized on recent CT of the chest/abdomen/pelvis. Disc levels: T12-L1: No significant disc protrusion, foraminal stenosis, or canal stenosis. L1-L2: No significant disc protrusion, foraminal stenosis, or canal stenosis. L2-L3: No significant disc protrusion, foraminal stenosis, or canal stenosis. L3-L4: Mild disc height loss and desiccation. No significant disc protrusion, foraminal stenosis, or canal stenosis. L4-L5: Mild disc height loss and desiccation. Small disc bulge and annular fissure. No significant disc protrusion, foraminal stenosis, or canal stenosis. L5-S1: Mild disc height loss and desiccation. No significant disc protrusion, foraminal stenosis, or canal stenosis. IMPRESSION: 1. No obvious evidence of discitis/osteomyelitis. Postcontrast imaging could provide more sensitive evaluation if clinically warranted. 2. Mild lower lumbar degenerative change without significant stenosis. 3. Abnormal bone marrow signal diffusely is likely related to the patient's iron infusions. 4. Known infrarenal abdominal aortic aneurysm better characterized on recent CT of the chest/abdomen/pelvis. Electronically Signed   By: Feliberto Harts M.D.   On: 01/11/2023 17:47     Scheduled Meds:  amLODipine  5 mg Oral Daily   aspirin EC  81 mg Oral Daily   calcitRIOL  2 mcg Oral Once per day on Tuesday Saturday   Chlorhexidine Gluconate Cloth  6 each Topical Q0600   dextromethorphan-guaiFENesin  1 tablet Oral BID   feeding supplement  237 mL Oral BID BM   hydrALAZINE  50 mg Oral TID   multivitamin  1 tablet Oral QHS   pantoprazole  40 mg Oral Daily   predniSONE  40 mg Oral Q breakfast   sodium chloride flush  10 mL Intravenous Q12H   Continuous  Infusions:   ceFAZolin (ANCEF) IV 1 g (01/12/23 1631)     LOS: 4 days  Time spent:    Zannie Cove, MD [facility]   01/13/2023, 8:02 AM

## 2023-01-13 NOTE — Plan of Care (Signed)

## 2023-01-13 NOTE — Interval H&P Note (Signed)
History and Physical Interval Note:  01/13/2023 9:37 AM  [name]  has presented today for surgery, with the diagnosis of bacteremia.  The various methods of treatment have been discussed with the patient and family. After consideration of risks, benefits and other options for treatment, the patient has consented to  Procedure(s): TRANSESOPHAGEAL ECHOCARDIOGRAM (N/A) as a surgical intervention.  The patient's history has been reviewed, patient examined, no change in status, stable for surgery.  I have reviewed the patient's chart and labs.  Questions were answered to the patient's satisfaction.     Armanda Magic

## 2023-01-13 NOTE — Progress Notes (Signed)
Progress Note  Patient Name: [name] Date of Encounter: 01/13/2023 Primary Cardiologist: Marca Ancona, MD   Subjective   Platelets have improved. No CP, SOB, Palpitations. Hematology has cleared him for procedures  Vital Signs    Vitals:   01/13/23 0021 01/13/23 0358 01/13/23 0547 01/13/23 0735  BP: (!) 143/82  (!) 159/85 (!) 150/81  Pulse: 82  79 77  Resp: 18  20 18   Temp: (!) 97.4 F (36.3 C)  98.1 F (36.7 C) 97.7 F (36.5 C)  TempSrc: Oral  Oral Oral  SpO2: 99%  98% 99%  Weight:  65.7 kg    Height:        Intake/Output Summary (Last 24 hours) at 01/13/2023 0752 Last data filed at 01/12/2023 2106 Gross per 24 hour  Intake 390 ml  Output 0 ml  Net 390 ml   Filed Weights   01/12/23 0957 01/12/23 1318 01/13/23 0358  Weight: 61.1 kg 61.1 kg 65.7 kg    Physical Exam   GEN: No acute distress.  Thin Male Neck: No JVD Cardiac: RRR, no  rubs, or gallops. Systolic and diastolic murmurs noted Respiratory: Clear to auscultation bilaterally. GI: Soft, nontender, non-distended  MS: No edema  Labs   Telemetry: SR   Chemistry Recent Labs  Lab 01/09/23 1539 01/10/23 0608 01/12/23 0925  NA 132* 134* 132*  K 3.7 3.9 4.4  CL 96* 98 97*  CO2 22 20* 20*  GLUCOSE 98 128* 167*  BUN 73* 80* 147*  CREATININE 10.07* 10.64* 11.45*  CALCIUM 9.9 9.4 10.3  PROT 8.1 6.2*  --   ALBUMIN 4.2 3.1* 2.9*  AST 39 32  --   ALT 23 19  --   ALKPHOS 49 42  --   BILITOT 1.2 1.0  --   GFRNONAA 6* 5* 5*  ANIONGAP 14 16* 15     Hematology Recent Labs  Lab 01/09/23 1539 01/10/23 0608 01/12/23 0925  WBC 6.6 8.4 15.1*  RBC 4.17* 3.43* 3.28*  HGB 13.4 10.6* 10.3*  HCT 40.7 31.7* 30.7*  MCV 97.6 92.4 93.6  MCH 32.1 30.9 31.4  MCHC 32.9 33.4 33.6  RDW 13.6 13.5 14.3  PLT 45* 34* 51*    Cardiac EnzymesNo results for input(s): "TROPONINI" in the last 168 hours. No results for input(s): "TROPIPOC" in the last 168 hours.   BNP Recent Labs  Lab 01/09/23 1539   BNP 1,044.9*     DDimer No results for input(s): "DDIMER" in the last 168 hours.   Cardiac Studies   Cardiac Studies & Procedures   CARDIAC CATHETERIZATION  CARDIAC CATHETERIZATION 02/05/2021  Narrative   Prox LAD to Mid LAD lesion is 30% stenosed.   Prox RCA lesion is 40% stenosed.   Prox Cx lesion is 40% stenosed.   Mid Cx lesion is 40% stenosed.   1st Diag lesion is 95% stenosed.   Mid LAD lesion is 40% stenosed.   Non-stenotic Ost Ramus lesion was previously treated.  1. Small high 1st diagonal with diffuse severe disease, similar to prior. 2. Otherwise, nonobstructive disease.  No interventional target.  Findings Coronary Findings Diagnostic  Dominance: Right  Left Anterior Descending Prox LAD to Mid LAD lesion is 30% stenosed. Mid LAD lesion is 40% stenosed.  First Diagonal Branch 1st Diag lesion is 95% stenosed.  Ramus Intermedius Non-stenotic Ost Ramus lesion was previously treated. Previously placed stent displays no restenosis.  Left Circumflex Prox Cx lesion is 40% stenosed. Mid Cx lesion is 40% stenosed.  Right Coronary Artery Prox RCA lesion is 40% stenosed.  Intervention  No interventions have been documented.   CARDIAC CATHETERIZATION  CARDIAC CATHETERIZATION 09/26/2018  Narrative  Ost 1st Diag lesion is 60% stenosed.  Conclusions: 1. Large, tortuous coronary arteries, mild to moderate disease involving the main branches.  Appearance is similar to prior catheterizations in 11/2017. 2. Patent ostial ramus intermedius stent.  Recommendations: 1. Medical therapy and secondary prevention. 2. Restart clopidogrel 75 mg daily in addition to aspirin 81 mg daily. 3. Post-cath hydration with repeat BMP tomorrow to monitor renal function following contrast exposure today.  Yvonne Kendall, MD [facility] Pager: [phone]  Findings Coronary Findings Diagnostic  Dominance: Right  Left Anterior Descending Prox LAD to Mid LAD  lesion is 30% stenosed.  First Diagonal Branch Ost 1st Diag lesion is 60% stenosed.  Ramus Intermedius Previously placed Ost Ramus stent (unknown type) is widely patent. Previously placed stent displays no restenosis.  Left Circumflex Prox Cx lesion is 50% stenosed.  Right Coronary Artery Prox RCA lesion is 50% stenosed.  Intervention  No interventions have been documented.   STRESS TESTS  NM MYOCAR MULTI W/SPECT W 11/25/2019  Narrative CLINICAL DATA:  Chest pain.  Cardiomyopathy.  EXAM: MYOCARDIAL IMAGING WITH SPECT (REST AND PHARMACOLOGIC-STRESS)  GATED LEFT VENTRICULAR WALL MOTION STUDY  LEFT VENTRICULAR EJECTION FRACTION  TECHNIQUE: Standard myocardial SPECT imaging was performed after resting intravenous injection of 10 mCi Tc-70m tetrofosmin. Subsequently, intravenous infusion of Lexiscan was performed under the supervision of the Cardiology staff. At peak effect of the drug, 30 mCi Tc-68m tetrofosmin was injected intravenously and standard myocardial SPECT imaging was performed. Quantitative gated imaging was also performed to evaluate left ventricular wall motion, and estimate left ventricular ejection fraction.  COMPARISON:  Two-view chest x-ray scratched at CT a of the chest 11/24/2019  FINDINGS: Perfusion: Matched defect is present at the inferior wall consistent with remote infarct. No reversible ischemia is evident.  Wall Motion: Marked hypokinesis is present at the septum and base.  Left Ventricular Ejection Fraction: 35 %  End diastolic volume 159 ml  End systolic volume 103 ml  IMPRESSION: 1. Remote infarct involving the inferior wall. Left ventricle is dilated.  2. Marked hypokinesis involving the inferior wall and septum.  3. Left ventricular ejection fraction 35%  4. Non invasive risk stratification*: High  *2012 Appropriate Use Criteria for Coronary Revascularization Focused Update: J Am Coll Cardiol.  2012;59(9):857-881. http://content.dementiazones.com.aspx?articleid=1201161   Electronically Signed By: Marin Roberts M.D. On: 11/25/2019 15:01   ECHOCARDIOGRAM  ECHOCARDIOGRAM COMPLETE 01/10/2023  Narrative ECHOCARDIOGRAM REPORT    Patient Name:   [name] Date of Exam: 01/10/2023 Medical Rec #:  [id number]     Height:       71.0 in Accession #:    [id number]    Weight:       132.0 lb Date of Birth:  10-02-1970      BSA:          1.767 m Patient Age:    52 years      BP:           143/78 mmHg Patient Gender: M             HR:           86 bpm. Exam Location:  Inpatient  Procedure: 2D Echo, Cardiac Doppler and Color Doppler  Indications:     CHF-Acute Diastolic I50.31  History:         Patient has  prior history of Echocardiogram examinations, most recent 04/15/2022. Cardiomyopathy, CAD and Previous Myocardial Infarction, ETOH abuse, Stroke and COPD, Aortic Valve Disease and Mitral Valve Disease; Risk Factors:Current Smoker, Hypertension and Dyslipidemia. Aortic dissection repair.  Sonographer:     Dondra Prader RVT RCS Referring Phys:  [id number] [facility] A[facility]O Diagnosing Phys: Lennie Odor MD  IMPRESSIONS   1. Left ventricular ejection fraction, by estimation, is 55 to 60%. The left ventricle has normal function. The left ventricle has no regional wall motion abnormalities. There is severe concentric left ventricular hypertrophy. Left ventricular diastolic parameters are consistent with Grade II diastolic dysfunction (pseudonormalization). 2. Right ventricular systolic function is normal. The right ventricular size is normal. Tricuspid regurgitation signal is inadequate for assessing PA pressure. 3. Left atrial size was severely dilated. 4. Right atrial size was mild to moderately dilated. 5. The mitral valve is grossly normal. Moderate mitral valve regurgitation. No evidence of mitral stenosis. 6. Bioprosthetic AoV. Vmax 3.5 m/s, MG 31 mmHG, EOA 0.90cm2,  DI 0.26, AT 120 msec. 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 The aortic valve has been repaired/replaced. Aortic valve regurgitation is not visualized. Severe aortic valve stenosis. Procedure Date: 11/2014. Echo findings are consistent with stenosis of the aortic prosthesis. Aortic valve acceleration time measures 120 msec. 7. Aortic root repair measures 38 mm. Aortic root/ascending aorta has been repaired/replaced. 8. The inferior vena cava is normal in size with greater than 50% respiratory variability, suggesting right atrial pressure of 3 mmHg.  Comparison(s): Changes from prior study are noted. Aortic prosthetic valve with concerns for severe stenosis on this study.  FINDINGS Left Ventricle: Left ventricular ejection fraction, by estimation, is 55 to 60%. The left ventricle has normal function. The left ventricle has no regional wall motion abnormalities. The left ventricular internal cavity size was normal in size. There is severe concentric left ventricular hypertrophy. Abnormal (paradoxical) septal motion consistent with post-operative status. Left ventricular diastolic parameters are consistent with Grade II diastolic dysfunction (pseudonormalization).  Right Ventricle: The right ventricular size is normal. No increase in right ventricular wall thickness. Right ventricular systolic function is normal. Tricuspid regurgitation signal is inadequate for assessing PA pressure.  Left Atrium: Left atrial size was severely dilated.  Right Atrium: Right atrial size was mild to moderately dilated.  Pericardium: There is no evidence of pericardial effusion.  Mitral Valve: The mitral valve is grossly normal. Moderate mitral valve regurgitation. No evidence of mitral valve stenosis.  Tricuspid Valve: The tricuspid valve is grossly normal. Tricuspid valve regurgitation is mild . No evidence of tricuspid stenosis.  Aortic Valve: Bioprosthetic AoV. Vmax 3.5 m/s, MG 31 mmHG,  EOA 0.90cm2, DI 0.26, AT 120 msec. 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 The aortic valve has been repaired/replaced. Aortic valve regurgitation is not visualized. Severe aortic stenosis is present. Aortic valve mean gradient measures 31.2 mmHg. Aortic valve peak gradient measures 50.6 mmHg. Aortic valve area, by VTI measures 0.90 cm. There is a unknown bioprosthetic valve present in the aortic position.  Pulmonic Valve: The pulmonic valve was grossly normal. Pulmonic valve regurgitation is not visualized. No evidence of pulmonic stenosis.  Aorta: Aortic root repair measures 38 mm. The aortic root/ascending aorta has been repaired/replaced.  Venous: The inferior vena cava is normal in size with greater than 50% respiratory variability, suggesting right atrial pressure of 3 mmHg.  IAS/Shunts: The atrial septum is grossly normal.   LEFT VENTRICLE PLAX 2D LVIDd:         5.10  cm   Diastology LVIDs:         3.50 cm   LV e' medial:    5.98 cm/s LV PW:         1.90 cm   LV E/e' medial:  13.5 LV IVS:        1.70 cm   LV e' lateral:   7.51 cm/s LVOT diam:     2.10 cm   LV E/e' lateral: 10.7 LV SV:         69 LV SV Index:   39 LVOT Area:     3.46 cm   RIGHT VENTRICLE             IVC RV Basal diam:  3.20 cm     IVC diam: 1.00 cm RV S prime:     12.90 cm/s TAPSE (M-mode): 2.3 cm  LEFT ATRIUM              Index        RIGHT ATRIUM           Index LA Vol (A2C):   114.0 ml 64.51 ml/m  RA Area:     22.40 cm LA Vol (A4C):   89.7 ml  50.76 ml/m  RA Volume:   69.20 ml  39.16 ml/m LA Biplane Vol: 117.0 ml 66.21 ml/m AORTIC VALVE                     PULMONIC VALVE AV Area (Vmax):    1.13 cm      PR End Diast Vel: 6.15 msec AV Area (Vmean):   1.12 cm AV Area (VTI):     0.90 cm AV Vmax:           355.73 cm/s AV Vmean:          238.367 cm/s AV VTI:            0.764 m AV Peak Grad:      50.6 mmHg AV Mean Grad:      31.2 mmHg LVOT Vmax:         116.00 cm/s LVOT  Vmean:        76.900 cm/s LVOT VTI:          0.199 m LVOT/AV VTI ratio: 0.26  AORTA Ao Root diam: 3.80 cm  MITRAL VALVE MV Area (PHT): 3.99 cm    SHUNTS MV Decel Time: 190 msec    Systemic VTI:  0.20 m MV E velocity: 80.50 cm/s  Systemic Diam: 2.10 cm MV A velocity: 77.20 cm/s MV E/A ratio:  1.04  Lennie Odor MD Electronically signed by Lennie Odor MD Signature Date/Time: 01/10/2023/10:15:25 AM    Final (Updated)   TEE  ECHO TEE 10/16/2020  Narrative TRANSESOPHOGEAL ECHO REPORT    Patient Name:   [name] Date of Exam: 10/16/2020 Medical Rec #:  [id number]     Height:       71.0 in Accession #:    [id number]    Weight:       158.0 lb Date of Birth:  05/18/1970      BSA:          1.908 m Patient Age:    50 years      BP:           106/42 mmHg Patient Gender: M             HR:           83 bpm. Exam  Location:  Inpatient  Procedure: Transesophageal Echo, Cardiac Doppler and Color Doppler  Indications:     Aortic valve disease  History:         Patient has prior history of Echocardiogram examinations, most recent 09/03/2020. CHF; Risk Factors:Hypertension and Dyslipidemia. Bentall procedure. Aortic Valve: valve is present in the aortic position.  Sonographer:     Renella Cunas RDCS Referring Phys:  [id number] [facility] Diagnosing Phys: Freida Busman McleanMD  PROCEDURE: The transesophogeal probe was passed without difficulty through the esophogus of the patient. Sedation performed by different physician. The patient was monitored while under deep sedation. Anesthestetic sedation was provided intravenously by Anesthesiology: 256.42mg  of Propofol, 100mg  of Lidocaine. The patient developed no complications during the procedure.  IMPRESSIONS   1. Left ventricular ejection fraction, by estimation, is 60 to 65%. The left ventricle has normal function. The left ventricle has no regional wall motion abnormalities. There is severe left ventricular hypertrophy. 2. Right  ventricular systolic function is normal. The right ventricular size is normal. 3. Left atrial size was mildly dilated. No left atrial/left atrial appendage thrombus was detected. 4. Right atrial size was mildly dilated. 5. The mitral valve is normal in structure. Mild mitral valve regurgitation. No evidence of mitral stenosis. 6. There is a bioprosthetic aortic valve. The valve appears to open with minimal restriction, but appears small/undersized. No perivalvular leakage/regurgitation, elevated mean gradient at 20 mmHg. 7. No PFO/ASD by color doppler. 8. S/p aortic root replacement, stable. Aortic root/ascending aorta has been repaired/replaced. 9. Moderately elevated mean gradient across the aortic valve (lower while under anesthesia, 20 mmHg mean). The valve appears to open with minimal restriction, suspect his chronically elevated gradient may be related to an undersized/small valve.  FINDINGS Left Ventricle: Left ventricular ejection fraction, by estimation, is 60 to 65%. The left ventricle has normal function. The left ventricle has no regional wall motion abnormalities. The left ventricular internal cavity size was normal in size. There is severe left ventricular hypertrophy.  Right Ventricle: The right ventricular size is normal. No increase in right ventricular wall thickness. Right ventricular systolic function is normal.  Left Atrium: Left atrial size was mildly dilated. No left atrial/left atrial appendage thrombus was detected.  Right Atrium: Right atrial size was mildly dilated.  Pericardium: There is no evidence of pericardial effusion.  Mitral Valve: The mitral valve is normal in structure. Mild mitral valve regurgitation. No evidence of mitral valve stenosis.  Tricuspid Valve: The tricuspid valve is normal in structure. Tricuspid valve regurgitation is trivial.  Aortic Valve: There is a bioprosthetic aortic valve. The valve appears to open with minimal restriction, but  appears small/undersized. No perivalvular leakage/regurgitation, elevated mean gradient at 20 mmHg. The aortic valve has been repaired/replaced. Aortic valve regurgitation is not visualized. Aortic valve mean gradient measures 17.0 mmHg. Aortic valve peak gradient measures 29.7 mmHg. Aortic valve area, by VTI measures 2.02 cm. There is a valve present in the aortic position.  Pulmonic Valve: The pulmonic valve was normal in structure. Pulmonic valve regurgitation is not visualized.  Aorta: S/p aortic root replacement, stable. The aortic root/ascending aorta has been repaired/replaced.  IAS/Shunts: No PFO/ASD by color doppler.   LEFT VENTRICLE PLAX 2D LVOT diam:     2.60 cm LV SV:         109 LV SV Index:   57 LVOT Area:     5.31 cm   AORTIC VALVE AV Area (Vmax):    1.78 cm AV Area (Vmean):   1.93  cm AV Area (VTI):     2.02 cm AV Vmax:           272.33 cm/s AV Vmean:          192.000 cm/s AV VTI:            0.540 m AV Peak Grad:      29.7 mmHg AV Mean Grad:      17.0 mmHg LVOT Vmax:         91.40 cm/s LVOT Vmean:        69.900 cm/s LVOT VTI:          0.206 m LVOT/AV VTI ratio: 0.38   SHUNTS Systemic VTI:  0.21 m Systemic Diam: 2.60 cm  Dalton McleanMD Electronically signed by Wilfred Lacy Signature Date/Time: 10/31/2020/4:59:30 PM    Final   MONITORS  LONG TERM MONITOR (3-14 DAYS) 12/02/2022            Assessment & Plan   Coagulase-negative Staphylococcus bacteremia - consented for TEE, planned today, discussed with lab, confirmed he was NPO   [facility] has been requested to perform a transesophageal echocardiogram on Diannia Ruder for bacteremia and prosthetic valve dysfunction.  After careful review of history and examination, the risks and benefits of transesophageal echocardiogram have been explained including risks of esophageal damage, perforation (1:10,000 risk), bleeding, pharyngeal hematoma as well as other potential complications associated  with conscious sedation including aspiration, arrhythmia, respiratory failure and death. Alternatives to treatment were discussed, questions were answered. Patient is willing to proceed.     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 Also has moderate mitral regurgitation. - TEE as above - if we had PET inflammatory imaging at [facility], this would be an appropriate test   Severe thrombocytopenia Platelet count significantly lower than his baseline thrombocytopenia but improving -Monitor platelet count closely; treatment as per hematology - platelets are recovering, assuming no dip in labs today (pending) he had been cleared for cardiac procedures (see 10/29 H/O note)    End-stage renal disease - as per nephrology   CAD s/p CABG and ramus PCI - no chest pain and limited by thrombocytopenia - he has been cleared for diagnostic LHC by hematology; if there are plans for cardiac surgery, we will perform LHC and RHC      For questions or updates, please contact [facility] Please consult [email or web address] for contact info under Cardiology/STEMI.      Riley Lam, MD FASE [facility] Cardiologist [facility]  [address] [place], [address] [place], [place] [address] [phone]  7:52 AM

## 2023-01-13 NOTE — Anesthesia Preprocedure Evaluation (Signed)
Anesthesia Evaluation  Patient identified by MRN, date of birth, ID band Patient awake    Reviewed: Allergy & Precautions, NPO status , Patient's Chart, lab work & pertinent test results, reviewed documented beta blocker date and time   History of Anesthesia Complications Negative for: history of anesthetic complications  Airway Mallampati: III       Dental  (+) Edentulous Upper   Pulmonary shortness of breath and with exertion, pneumonia, COPD, Current Smoker   breath sounds clear to auscultation       Cardiovascular hypertension, + angina with exertion + CAD, + Past MI and +CHF  + Valvular Problems/Murmurs AS  Rhythm:Regular Rate:Normal + Systolic murmurs Severe LVH with severe prosthetic AS   Neuro/Psych  Headaches, Seizures -, Well Controlled,  PSYCHIATRIC DISORDERS Anxiety Depression    CVA, No Residual Symptoms    GI/Hepatic ,GERD  ,,  Endo/Other    Renal/GU ESRF and DialysisRenal disease     Musculoskeletal  (+) Arthritis ,    Abdominal   Peds  Hematology  (+) Blood dyscrasia, anemia   Anesthesia Other Findings   Reproductive/Obstetrics                             Anesthesia Physical Anesthesia Plan  ASA: 3  Anesthesia Plan: MAC   Post-op Pain Management:    Induction: Intravenous  PONV Risk Score and Plan: 1 and Ondansetron and Propofol infusion  Airway Management Planned:   Additional Equipment:   Intra-op Plan:   Post-operative Plan:   Informed Consent: I have reviewed the patients History and Physical, chart, labs and discussed the procedure including the risks, benefits and alternatives for the proposed anesthesia with the patient or authorized representative who has indicated his/her understanding and acceptance.     Dental advisory given  Plan Discussed with: CRNA  Anesthesia Plan Comments:        Anesthesia Quick Evaluation

## 2023-01-13 NOTE — Interval H&P Note (Signed)
History and Physical Interval Note:  01/13/2023 8:19 AM  [name]  has presented today for surgery, with the diagnosis of bacteremia.  The various methods of treatment have been discussed with the patient and family. After consideration of risks, benefits and other options for treatment, the patient has consented to  Procedure(s): TRANSESOPHAGEAL ECHOCARDIOGRAM (N/A) as a surgical intervention.  The patient's history has been reviewed, patient examined, no change in status, stable for surgery.  I have reviewed the patient's chart and labs.  Questions were answered to the patient's satisfaction.     Armanda Magic

## 2023-01-13 NOTE — Progress Notes (Signed)
PHARMACY CONSULT NOTE FOR:  OUTPATIENT  PARENTERAL ANTIBIOTIC THERAPY (OPAT)  This is an information OPAT nephrology is aware of antibiotics and will coordinate with HD   Indication: MSSE bacteremia  Regimen: Cefazolin 2 g IV TTS with HD  End date: 02/22/23  IV antibiotic discharge orders are pended. To discharging provider:  please sign these orders via discharge navigator,  Select New Orders & click on the button choice - Manage This Unsigned Work.   Thank you for allowing pharmacy to be a part of this patient's care.  Griffin Dakin 01/13/2023, 2:53 PM

## 2023-01-14 ENCOUNTER — Other Ambulatory Visit (HOSPITAL_COMMUNITY): Payer: Self-pay

## 2023-01-14 ENCOUNTER — Encounter: Payer: Self-pay | Admitting: Gastroenterology

## 2023-01-14 DIAGNOSIS — R509 Fever, unspecified: Secondary | ICD-10-CM | POA: Diagnosis not present

## 2023-01-14 LAB — BASIC METABOLIC PANEL
Anion gap: 11 (ref 5–15)
BUN: 110 mg/dL — ABNORMAL HIGH (ref 6–20)
CO2: 25 mmol/L (ref 22–32)
Calcium: 10 mg/dL (ref 8.9–10.3)
Chloride: 97 mmol/L — ABNORMAL LOW (ref 98–111)
Creatinine, Ser: 8.97 mg/dL — ABNORMAL HIGH (ref 0.61–1.24)
GFR, Estimated: 7 mL/min — ABNORMAL LOW (ref >60)
Glucose, Bld: 105 mg/dL — ABNORMAL HIGH (ref 70–99)
Potassium: 3.8 mmol/L (ref 3.5–5.1)
Sodium: 133 mmol/L — ABNORMAL LOW (ref 135–145)

## 2023-01-14 LAB — CBC WITH DIFFERENTIAL/PLATELET
Abs Immature Granulocytes: 0.65 10*3/uL — ABNORMAL HIGH (ref 0.00–0.07)
Basophils Absolute: 0.1 10*3/uL (ref 0.0–0.1)
Basophils Relative: 0 %
Eosinophils Absolute: 0 10*3/uL (ref 0.0–0.5)
Eosinophils Relative: 0 %
HCT: 29.2 % — ABNORMAL LOW (ref 39.0–52.0)
Hemoglobin: 9.9 g/dL — ABNORMAL LOW (ref 13.0–17.0)
Immature Granulocytes: 5 %
Lymphocytes Relative: 12 %
Lymphs Abs: 1.4 10*3/uL (ref 0.7–4.0)
MCH: 31.7 pg (ref 26.0–34.0)
MCHC: 33.9 g/dL (ref 30.0–36.0)
MCV: 93.6 fL (ref 80.0–100.0)
Monocytes Absolute: 1.8 10*3/uL — ABNORMAL HIGH (ref 0.1–1.0)
Monocytes Relative: 15 %
Neutro Abs: 8.3 10*3/uL — ABNORMAL HIGH (ref 1.7–7.7)
Neutrophils Relative %: 68 %
Platelets: 121 10*3/uL — ABNORMAL LOW (ref 150–400)
RBC: 3.12 MIL/uL — ABNORMAL LOW (ref 4.22–5.81)
RDW: 14.6 % (ref 11.5–15.5)
WBC: 12.3 10*3/uL — ABNORMAL HIGH (ref 4.0–10.5)
nRBC: 0 % (ref 0.0–0.2)

## 2023-01-14 MED ORDER — AMLODIPINE BESYLATE 10 MG PO TABS
10.0000 mg | ORAL_TABLET | Freq: Every day | ORAL | Status: DC
  Administered 2023-01-14: 10 mg via ORAL
  Filled 2023-01-14: qty 1

## 2023-01-14 MED ORDER — NITROGLYCERIN 0.4 MG SL SUBL
0.4000 mg | SUBLINGUAL_TABLET | SUBLINGUAL | 0 refills | Status: AC | PRN
  Filled 2023-01-14: qty 25, 7d supply, fill #0

## 2023-01-14 MED ORDER — CEFAZOLIN SODIUM-DEXTROSE 2-4 GM/100ML-% IV SOLN: 2.0000 g | INTRAVENOUS | Status: DC

## 2023-01-14 MED ORDER — NITROGLYCERIN 0.4 MG SL SUBL: 0.4000 mg | SUBLINGUAL_TABLET | SUBLINGUAL | Status: DC | PRN

## 2023-01-14 MED ORDER — SENNOSIDES-DOCUSATE SODIUM 8.6-50 MG PO TABS
2.0000 | ORAL_TABLET | Freq: Two times a day (BID) | ORAL | 0 refills | Status: DC
  Filled 2023-01-14: qty 60, 15d supply, fill #0

## 2023-01-14 MED ORDER — POLYETHYLENE GLYCOL 3350 17 GM/SCOOP PO POWD
17.0000 g | Freq: Every day | ORAL | 0 refills | Status: AC
  Filled 2023-01-14: qty 238, 14d supply, fill #0

## 2023-01-14 MED ORDER — CEFAZOLIN IV (FOR PTA / DISCHARGE USE ONLY): 2.0000 g | INTRAVENOUS | Status: DC

## 2023-01-14 MED ORDER — LABETALOL HCL 5 MG/ML IV SOLN: 10.0000 mg | INTRAVENOUS | Status: DC | PRN

## 2023-01-14 MED ORDER — PREDNISONE 20 MG PO TABS
40.0000 mg | ORAL_TABLET | Freq: Every day | ORAL | 0 refills | Status: DC
  Filled 2023-01-14: qty 6, 3d supply, fill #0

## 2023-01-14 MED ORDER — HYDRALAZINE HCL 100 MG PO TABS: 50.0000 mg | ORAL_TABLET | Freq: Three times a day (TID) | ORAL | Status: AC

## 2023-01-14 MED ORDER — ACETAMINOPHEN 325 MG PO TABS
650.0000 mg | ORAL_TABLET | ORAL | 0 refills | Status: DC | PRN
  Filled 2023-01-14: qty 60, 5d supply, fill #0

## 2023-01-14 NOTE — Progress Notes (Signed)
2D LVIDd:         5.10 cm   Diastology LVIDs:         3.50 cm   LV e' medial:    5.98 cm/s LV PW:         1.90 cm   LV E/e' medial:  13.5 LV IVS:        1.70 cm   LV e' lateral:   7.51 cm/s LVOT diam:     2.10 cm   LV E/e' lateral: 10.7 LV SV:         69 LV SV Index:   39 LVOT Area:     3.46 cm   RIGHT VENTRICLE             IVC RV Basal diam:  3.20 cm     IVC diam: 1.00 cm RV S prime:     12.90 cm/s TAPSE (M-mode): 2.3 cm  LEFT ATRIUM              Index        RIGHT ATRIUM           Index LA Vol (A2C):   114.0 ml 64.51 ml/m  RA Area:     22.40 cm LA Vol (A4C):   89.7 ml  50.76 ml/m  RA Volume:   69.20 ml  39.16 ml/m LA Biplane Vol: 117.0 ml 66.21 ml/m AORTIC VALVE                     PULMONIC VALVE AV Area (Vmax):    1.13 cm      PR End Diast Vel: 6.15 msec AV Area (Vmean):   1.12 cm AV Area (VTI):     0.90 cm AV Vmax:           355.73 cm/s AV Vmean:          238.367 cm/s AV VTI:            0.764 m AV Peak Grad:      50.6  mmHg AV Mean Grad:      31.2 mmHg LVOT Vmax:         116.00 cm/s LVOT Vmean:        76.900 cm/s LVOT VTI:          0.199 m LVOT/AV VTI ratio: 0.26  AORTA Ao Root diam: 3.80 cm  MITRAL VALVE MV Area (PHT): 3.99 cm    SHUNTS MV Decel Time: 190 msec    Systemic VTI:  0.20 m MV E velocity: 80.50 cm/s  Systemic Diam: 2.10 cm MV A velocity: 77.20 cm/s MV E/A ratio:  1.04  Lennie Odor MD Electronically signed by Lennie Odor MD Signature Date/Time: 01/10/2023/10:15:25 AM    Final (Updated)   TEE  ECHO TEE 01/13/2023  Narrative TRANSESOPHOGEAL ECHO REPORT    Patient Name:   [name] Date of Exam: 01/13/2023 Medical Rec #:  [id number]     Height:       71.0 in Accession #:    [id number]    Weight:       144.8 lb Date of Birth:  03/09/1971      BSA:          1.838 m Patient Age:    52 years      BP:           150/81 mmHg Patient Gender: M             HR:  2D LVIDd:         5.10 cm   Diastology LVIDs:         3.50 cm   LV e' medial:    5.98 cm/s LV PW:         1.90 cm   LV E/e' medial:  13.5 LV IVS:        1.70 cm   LV e' lateral:   7.51 cm/s LVOT diam:     2.10 cm   LV E/e' lateral: 10.7 LV SV:         69 LV SV Index:   39 LVOT Area:     3.46 cm   RIGHT VENTRICLE             IVC RV Basal diam:  3.20 cm     IVC diam: 1.00 cm RV S prime:     12.90 cm/s TAPSE (M-mode): 2.3 cm  LEFT ATRIUM              Index        RIGHT ATRIUM           Index LA Vol (A2C):   114.0 ml 64.51 ml/m  RA Area:     22.40 cm LA Vol (A4C):   89.7 ml  50.76 ml/m  RA Volume:   69.20 ml  39.16 ml/m LA Biplane Vol: 117.0 ml 66.21 ml/m AORTIC VALVE                     PULMONIC VALVE AV Area (Vmax):    1.13 cm      PR End Diast Vel: 6.15 msec AV Area (Vmean):   1.12 cm AV Area (VTI):     0.90 cm AV Vmax:           355.73 cm/s AV Vmean:          238.367 cm/s AV VTI:            0.764 m AV Peak Grad:      50.6  mmHg AV Mean Grad:      31.2 mmHg LVOT Vmax:         116.00 cm/s LVOT Vmean:        76.900 cm/s LVOT VTI:          0.199 m LVOT/AV VTI ratio: 0.26  AORTA Ao Root diam: 3.80 cm  MITRAL VALVE MV Area (PHT): 3.99 cm    SHUNTS MV Decel Time: 190 msec    Systemic VTI:  0.20 m MV E velocity: 80.50 cm/s  Systemic Diam: 2.10 cm MV A velocity: 77.20 cm/s MV E/A ratio:  1.04  Lennie Odor MD Electronically signed by Lennie Odor MD Signature Date/Time: 01/10/2023/10:15:25 AM    Final (Updated)   TEE  ECHO TEE 01/13/2023  Narrative TRANSESOPHOGEAL ECHO REPORT    Patient Name:   [name] Date of Exam: 01/13/2023 Medical Rec #:  [id number]     Height:       71.0 in Accession #:    [id number]    Weight:       144.8 lb Date of Birth:  03/09/1971      BSA:          1.838 m Patient Age:    52 years      BP:           150/81 mmHg Patient Gender: M             HR:  lesion is 40% stenosed. Mid Cx lesion is 40% stenosed.  Right Coronary Artery Prox RCA lesion is 40% stenosed.  Intervention  No interventions have been documented.   CARDIAC CATHETERIZATION  CARDIAC CATHETERIZATION 09/26/2018  Narrative  Ost 1st Diag lesion is 60% stenosed.  Conclusions: 1. Large, tortuous coronary arteries, mild to moderate disease involving the main branches.  Appearance is similar to prior catheterizations in 11/2017. 2. Patent ostial ramus intermedius stent.  Recommendations: 1. Medical therapy and secondary prevention. 2. Restart clopidogrel 75 mg daily in addition to aspirin 81 mg daily. 3. Post-cath hydration with repeat BMP tomorrow to monitor renal function following contrast exposure today.  Yvonne Kendall, MD [facility] Pager: [phone]  Findings Coronary Findings Diagnostic   Dominance: Right  Left Anterior Descending Prox LAD to Mid LAD lesion is 30% stenosed.  First Diagonal Branch Ost 1st Diag lesion is 60% stenosed.  Ramus Intermedius Previously placed Ost Ramus stent (unknown type) is widely patent. Previously placed stent displays no restenosis.  Left Circumflex Prox Cx lesion is 50% stenosed.  Right Coronary Artery Prox RCA lesion is 50% stenosed.  Intervention  No interventions have been documented.   STRESS TESTS  NM MYOCAR MULTI W/SPECT W 11/25/2019  Narrative CLINICAL DATA:  Chest pain.  Cardiomyopathy.  EXAM: MYOCARDIAL IMAGING WITH SPECT (REST AND PHARMACOLOGIC-STRESS)  GATED LEFT VENTRICULAR WALL MOTION STUDY  LEFT VENTRICULAR EJECTION FRACTION  TECHNIQUE: Standard myocardial SPECT imaging was performed after resting intravenous injection of 10 mCi Tc-35m tetrofosmin. Subsequently, intravenous infusion of Lexiscan was performed under the supervision of the Cardiology staff. At peak effect of the drug, 30 mCi Tc-61m tetrofosmin was injected intravenously and standard myocardial SPECT imaging was performed. Quantitative gated imaging was also performed to evaluate left ventricular wall motion, and estimate left ventricular ejection fraction.  COMPARISON:  Two-view chest x-ray scratched at CT a of the chest 11/24/2019  FINDINGS: Perfusion: Matched defect is present at the inferior wall consistent with remote infarct. No reversible ischemia is evident.  Wall Motion: Marked hypokinesis is present at the septum and base.  Left Ventricular Ejection Fraction: 35 %  End diastolic volume 159 ml  End systolic volume 103 ml  IMPRESSION: 1. Remote infarct involving the inferior wall. Left ventricle is dilated.  2. Marked hypokinesis involving the inferior wall and septum.  3. Left ventricular ejection fraction 35%  4. Non invasive risk stratification*: High  *2012 Appropriate Use Criteria for Coronary  Revascularization Focused Update: J Am Coll Cardiol. 2012;59(9):857-881. http://content.dementiazones.com.aspx?articleid=1201161   Electronically Signed By: Marin Roberts M.D. On: 11/25/2019 15:01   ECHOCARDIOGRAM  ECHOCARDIOGRAM COMPLETE 01/10/2023  Narrative ECHOCARDIOGRAM REPORT    Patient Name:   [name] Date of Exam: 01/10/2023 Medical Rec #:  [id number]     Height:       71.0 in Accession #:    [id number]    Weight:       132.0 lb Date of Birth:  16-Nov-1970      BSA:          1.767 m Patient Age:    52 years      BP:           143/78 mmHg Patient Gender: M             HR:           86 bpm. Exam Location:  Inpatient  Procedure: 2D Echo, Cardiac Doppler and Color Doppler  Indications:     CHF-Acute Diastolic I50.31  lesion is 40% stenosed. Mid Cx lesion is 40% stenosed.  Right Coronary Artery Prox RCA lesion is 40% stenosed.  Intervention  No interventions have been documented.   CARDIAC CATHETERIZATION  CARDIAC CATHETERIZATION 09/26/2018  Narrative  Ost 1st Diag lesion is 60% stenosed.  Conclusions: 1. Large, tortuous coronary arteries, mild to moderate disease involving the main branches.  Appearance is similar to prior catheterizations in 11/2017. 2. Patent ostial ramus intermedius stent.  Recommendations: 1. Medical therapy and secondary prevention. 2. Restart clopidogrel 75 mg daily in addition to aspirin 81 mg daily. 3. Post-cath hydration with repeat BMP tomorrow to monitor renal function following contrast exposure today.  Yvonne Kendall, MD [facility] Pager: [phone]  Findings Coronary Findings Diagnostic   Dominance: Right  Left Anterior Descending Prox LAD to Mid LAD lesion is 30% stenosed.  First Diagonal Branch Ost 1st Diag lesion is 60% stenosed.  Ramus Intermedius Previously placed Ost Ramus stent (unknown type) is widely patent. Previously placed stent displays no restenosis.  Left Circumflex Prox Cx lesion is 50% stenosed.  Right Coronary Artery Prox RCA lesion is 50% stenosed.  Intervention  No interventions have been documented.   STRESS TESTS  NM MYOCAR MULTI W/SPECT W 11/25/2019  Narrative CLINICAL DATA:  Chest pain.  Cardiomyopathy.  EXAM: MYOCARDIAL IMAGING WITH SPECT (REST AND PHARMACOLOGIC-STRESS)  GATED LEFT VENTRICULAR WALL MOTION STUDY  LEFT VENTRICULAR EJECTION FRACTION  TECHNIQUE: Standard myocardial SPECT imaging was performed after resting intravenous injection of 10 mCi Tc-35m tetrofosmin. Subsequently, intravenous infusion of Lexiscan was performed under the supervision of the Cardiology staff. At peak effect of the drug, 30 mCi Tc-61m tetrofosmin was injected intravenously and standard myocardial SPECT imaging was performed. Quantitative gated imaging was also performed to evaluate left ventricular wall motion, and estimate left ventricular ejection fraction.  COMPARISON:  Two-view chest x-ray scratched at CT a of the chest 11/24/2019  FINDINGS: Perfusion: Matched defect is present at the inferior wall consistent with remote infarct. No reversible ischemia is evident.  Wall Motion: Marked hypokinesis is present at the septum and base.  Left Ventricular Ejection Fraction: 35 %  End diastolic volume 159 ml  End systolic volume 103 ml  IMPRESSION: 1. Remote infarct involving the inferior wall. Left ventricle is dilated.  2. Marked hypokinesis involving the inferior wall and septum.  3. Left ventricular ejection fraction 35%  4. Non invasive risk stratification*: High  *2012 Appropriate Use Criteria for Coronary  Revascularization Focused Update: J Am Coll Cardiol. 2012;59(9):857-881. http://content.dementiazones.com.aspx?articleid=1201161   Electronically Signed By: Marin Roberts M.D. On: 11/25/2019 15:01   ECHOCARDIOGRAM  ECHOCARDIOGRAM COMPLETE 01/10/2023  Narrative ECHOCARDIOGRAM REPORT    Patient Name:   [name] Date of Exam: 01/10/2023 Medical Rec #:  [id number]     Height:       71.0 in Accession #:    [id number]    Weight:       132.0 lb Date of Birth:  16-Nov-1970      BSA:          1.767 m Patient Age:    52 years      BP:           143/78 mmHg Patient Gender: M             HR:           86 bpm. Exam Location:  Inpatient  Procedure: 2D Echo, Cardiac Doppler and Color Doppler  Indications:     CHF-Acute Diastolic I50.31  lesion is 40% stenosed. Mid Cx lesion is 40% stenosed.  Right Coronary Artery Prox RCA lesion is 40% stenosed.  Intervention  No interventions have been documented.   CARDIAC CATHETERIZATION  CARDIAC CATHETERIZATION 09/26/2018  Narrative  Ost 1st Diag lesion is 60% stenosed.  Conclusions: 1. Large, tortuous coronary arteries, mild to moderate disease involving the main branches.  Appearance is similar to prior catheterizations in 11/2017. 2. Patent ostial ramus intermedius stent.  Recommendations: 1. Medical therapy and secondary prevention. 2. Restart clopidogrel 75 mg daily in addition to aspirin 81 mg daily. 3. Post-cath hydration with repeat BMP tomorrow to monitor renal function following contrast exposure today.  Yvonne Kendall, MD [facility] Pager: [phone]  Findings Coronary Findings Diagnostic   Dominance: Right  Left Anterior Descending Prox LAD to Mid LAD lesion is 30% stenosed.  First Diagonal Branch Ost 1st Diag lesion is 60% stenosed.  Ramus Intermedius Previously placed Ost Ramus stent (unknown type) is widely patent. Previously placed stent displays no restenosis.  Left Circumflex Prox Cx lesion is 50% stenosed.  Right Coronary Artery Prox RCA lesion is 50% stenosed.  Intervention  No interventions have been documented.   STRESS TESTS  NM MYOCAR MULTI W/SPECT W 11/25/2019  Narrative CLINICAL DATA:  Chest pain.  Cardiomyopathy.  EXAM: MYOCARDIAL IMAGING WITH SPECT (REST AND PHARMACOLOGIC-STRESS)  GATED LEFT VENTRICULAR WALL MOTION STUDY  LEFT VENTRICULAR EJECTION FRACTION  TECHNIQUE: Standard myocardial SPECT imaging was performed after resting intravenous injection of 10 mCi Tc-35m tetrofosmin. Subsequently, intravenous infusion of Lexiscan was performed under the supervision of the Cardiology staff. At peak effect of the drug, 30 mCi Tc-61m tetrofosmin was injected intravenously and standard myocardial SPECT imaging was performed. Quantitative gated imaging was also performed to evaluate left ventricular wall motion, and estimate left ventricular ejection fraction.  COMPARISON:  Two-view chest x-ray scratched at CT a of the chest 11/24/2019  FINDINGS: Perfusion: Matched defect is present at the inferior wall consistent with remote infarct. No reversible ischemia is evident.  Wall Motion: Marked hypokinesis is present at the septum and base.  Left Ventricular Ejection Fraction: 35 %  End diastolic volume 159 ml  End systolic volume 103 ml  IMPRESSION: 1. Remote infarct involving the inferior wall. Left ventricle is dilated.  2. Marked hypokinesis involving the inferior wall and septum.  3. Left ventricular ejection fraction 35%  4. Non invasive risk stratification*: High  *2012 Appropriate Use Criteria for Coronary  Revascularization Focused Update: J Am Coll Cardiol. 2012;59(9):857-881. http://content.dementiazones.com.aspx?articleid=1201161   Electronically Signed By: Marin Roberts M.D. On: 11/25/2019 15:01   ECHOCARDIOGRAM  ECHOCARDIOGRAM COMPLETE 01/10/2023  Narrative ECHOCARDIOGRAM REPORT    Patient Name:   [name] Date of Exam: 01/10/2023 Medical Rec #:  [id number]     Height:       71.0 in Accession #:    [id number]    Weight:       132.0 lb Date of Birth:  16-Nov-1970      BSA:          1.767 m Patient Age:    52 years      BP:           143/78 mmHg Patient Gender: M             HR:           86 bpm. Exam Location:  Inpatient  Procedure: 2D Echo, Cardiac Doppler and Color Doppler  Indications:     CHF-Acute Diastolic I50.31  2D LVIDd:         5.10 cm   Diastology LVIDs:         3.50 cm   LV e' medial:    5.98 cm/s LV PW:         1.90 cm   LV E/e' medial:  13.5 LV IVS:        1.70 cm   LV e' lateral:   7.51 cm/s LVOT diam:     2.10 cm   LV E/e' lateral: 10.7 LV SV:         69 LV SV Index:   39 LVOT Area:     3.46 cm   RIGHT VENTRICLE             IVC RV Basal diam:  3.20 cm     IVC diam: 1.00 cm RV S prime:     12.90 cm/s TAPSE (M-mode): 2.3 cm  LEFT ATRIUM              Index        RIGHT ATRIUM           Index LA Vol (A2C):   114.0 ml 64.51 ml/m  RA Area:     22.40 cm LA Vol (A4C):   89.7 ml  50.76 ml/m  RA Volume:   69.20 ml  39.16 ml/m LA Biplane Vol: 117.0 ml 66.21 ml/m AORTIC VALVE                     PULMONIC VALVE AV Area (Vmax):    1.13 cm      PR End Diast Vel: 6.15 msec AV Area (Vmean):   1.12 cm AV Area (VTI):     0.90 cm AV Vmax:           355.73 cm/s AV Vmean:          238.367 cm/s AV VTI:            0.764 m AV Peak Grad:      50.6  mmHg AV Mean Grad:      31.2 mmHg LVOT Vmax:         116.00 cm/s LVOT Vmean:        76.900 cm/s LVOT VTI:          0.199 m LVOT/AV VTI ratio: 0.26  AORTA Ao Root diam: 3.80 cm  MITRAL VALVE MV Area (PHT): 3.99 cm    SHUNTS MV Decel Time: 190 msec    Systemic VTI:  0.20 m MV E velocity: 80.50 cm/s  Systemic Diam: 2.10 cm MV A velocity: 77.20 cm/s MV E/A ratio:  1.04  Lennie Odor MD Electronically signed by Lennie Odor MD Signature Date/Time: 01/10/2023/10:15:25 AM    Final (Updated)   TEE  ECHO TEE 01/13/2023  Narrative TRANSESOPHOGEAL ECHO REPORT    Patient Name:   [name] Date of Exam: 01/13/2023 Medical Rec #:  [id number]     Height:       71.0 in Accession #:    [id number]    Weight:       144.8 lb Date of Birth:  03/09/1971      BSA:          1.838 m Patient Age:    52 years      BP:           150/81 mmHg Patient Gender: M             HR:

## 2023-01-14 NOTE — Progress Notes (Signed)
[facility]  Date of Admission:  01/09/2023   Total days of inpatient antibiotics 4  Principal Problem:   Acute febrile illness Active Problems:   Tobacco use   Aortic valve disease   Thrombocytopenia (HCC)   GERD without esophagitis   Essential hypertension   Mixed hyperlipidemia   Coronary artery disease involving native coronary artery of native heart with angina pectoris (HCC)   Chest pain   COPD (chronic obstructive pulmonary disease) (HCC)   End-stage renal disease on hemodialysis (HCC)   SIRS (systemic inflammatory response syndrome) (HCC)   Acute bronchitis   Chronic heart failure with preserved ejection fraction (HFpEF) (HCC)   Elevated troponin   Weight loss   Protein-calorie malnutrition, severe          Assessment: 52 YM admitted with #MSSE bacteremia with prosthetic AV #low back pain -4/4 bottle MSSE( both sets from right arm) which makes it difficult to discern contamination versus true bacteremia. Sens of both sets match -TTE noted severe AV stenosis-->TEE no veg, moderate thickened AV -MRI lumbar spine did not show infection Recommendations: -Continue cefazolin with HD -follow repeat blood cx to ensure clearance -Plan on 6 weeks of antibiotics(EOT 12/8) for given MSSE bacteremia in the setting of prosthetic AV. NO vegetation noted on TEE, no suppression needed. -ID F>U on 12/3 with myself ID will sign off  Microbiology:   Antibiotics: Cefepime  ,vanc, metro 10/26 Cefazolin 10/27-  Cultures: Blood 1026 4/4 msse 10/28 NG Urine  Other   SUBJECTIVE: No new complatins Interval: afebrilee overinght.  WBC 11.5 K today  Review of Systems: Review of Systems  All other systems reviewed and are negative.    Scheduled Meds:  amLODipine  5 mg Oral Daily   aspirin EC  81 mg Oral Daily   calcitRIOL  2 mcg Oral Once per day on Tuesday Saturday   Chlorhexidine Gluconate Cloth  6 each Topical Q0600    dextromethorphan-guaiFENesin  1 tablet Oral BID   feeding supplement  237 mL Oral BID BM   hydrALAZINE  50 mg Oral TID   multivitamin  1 tablet Oral QHS   pantoprazole  40 mg Oral Daily   predniSONE  40 mg Oral Q breakfast   sodium chloride flush  10 mL Intravenous Q12H   Continuous Infusions:   ceFAZolin (ANCEF) IV 1 g (01/13/23 1635)   PRN Meds:.acetaminophen, bisacodyl, cyclobenzaprine, fentaNYL (SUBLIMAZE) injection, ipratropium-albuterol, ondansetron **OR** ondansetron (ZOFRAN) IV, senna-docusate Allergies  Allergen Reactions   Clonidine Derivatives Nausea And Vomiting and Other (See Comments)    Tablets by mouth = Tolerated  Patches on the skin = Nausea & vomiting after wearing 4-5 days    Imdur [Isosorbide Nitrate] Other (See Comments)    Headaches -- patient instructed to continue taking    Tramadol Other (See Comments)    Headaches     OBJECTIVE: Vitals:   01/13/23 1030 01/13/23 1602 01/13/23 1952 01/14/23 0223  BP: (!) 167/89 (!) 149/82 (!) 173/86   Pulse: 74 83 85   Resp:  17    Temp:  98.6 F (37 C) 97.9 F (36.6 C)   TempSrc:  Oral Oral   SpO2: 100% 98% 99%   Weight:    69.5 kg  Height:       Body mass index is 21.37 kg/m.  Physical Exam Constitutional:      General: He is not in acute distress.    Appearance: He is  normal weight. He is not toxic-appearing.  HENT:     Head: Normocephalic and atraumatic.     Right Ear: External ear normal.     Left Ear: External ear normal.     Nose: No congestion or rhinorrhea.     Mouth/Throat:     Mouth: Mucous membranes are moist.     Pharynx: Oropharynx is clear.  Eyes:     Extraocular Movements: Extraocular movements intact.     Conjunctiva/sclera: Conjunctivae normal.     Pupils: Pupils are equal, round, and reactive to light.  Cardiovascular:     Rate and Rhythm: Normal rate and regular rhythm.     Heart sounds: No murmur heard.    No friction rub. No gallop.  Pulmonary:     Effort: Pulmonary effort  is normal.     Breath sounds: Normal breath sounds.  Abdominal:     General: Abdomen is flat. Bowel sounds are normal.     Palpations: Abdomen is soft.  Musculoskeletal:        General: No swelling. Normal range of motion.     Cervical back: Normal range of motion and neck supple.  Skin:    General: Skin is warm and dry.  Neurological:     General: No focal deficit present.     Mental Status: He is oriented to person, place, and time.  Psychiatric:        Mood and Affect: Mood normal.       Lab Results Lab Results  Component Value Date   WBC 11.5 (H) 01/13/2023   HGB 10.3 (L) 01/13/2023   HCT 30.4 (L) 01/13/2023   MCV 92.1 01/13/2023   PLT 75 (L) 01/13/2023    Lab Results  Component Value Date   CREATININE 11.45 (H) 01/12/2023   BUN 147 (H) 01/12/2023   NA 132 (L) 01/12/2023   K 4.4 01/12/2023   CL 97 (L) 01/12/2023   CO2 20 (L) 01/12/2023    Lab Results  Component Value Date   ALT 19 01/10/2023   AST 32 01/10/2023   ALKPHOS 42 01/10/2023   BILITOT 1.0 01/10/2023        Danelle Earthly, MD [facility] 01/14/2023, 6:04 AM I have personally spent 52 minutes involved in face-to-face and non-face-to-face activities for this patient on the day of the visit. Professional time spent includes the following activities: Preparing to see the patient (review of tests), Obtaining and/or reviewing separately obtained history (admission/discharge record), Performing a medically appropriate examination and/or evaluation , Ordering medications/tests/procedures, referring and communicating with other health care professionals, Documenting clinical information in the EMR, Independently interpreting results (not separately reported), Communicating results to the patient/family/caregiver, Counseling and educating the patient/family/caregiver and Care coordination (not separately reported).

## 2023-01-14 NOTE — Progress Notes (Signed)
Discharge instructions given. Patient verbalized understanding and all questions were answered.  ?

## 2023-01-14 NOTE — Discharge Summary (Signed)
Physician Discharge Summary  [name]:[id number] DOB: 02/03/1971 DOA: 01/09/2023  PCP: Loura Back, NP  Admit date: 01/09/2023 Discharge date: 01/14/2023  Admitted From: Home Disposition:  Home  Discharge Condition:Stable CODE STATUS:FULL Diet recommendation: Renal  Brief/Interim Summary: Patient is a 52 year old male with history of hypertension, hyperlipidemia, coronary artery disease status post CABG, MIs, CVA, ESRD on hemodialysis once a week, descending thoracic aortic aneurysm, history of aortic valve replacement with bioprosthetic valve, severe left ventricular venous distention, tobacco use who presented to the Emergency Department with complaint of 2-week history of midsternal chest pain.  Blood cultures came out a positive for Staph epidermidis.  Hospital course remarkable for worsening thrombocytopenia.  Echo also showed moderate/severe stenosis of the posterior aortic valve.  Cardiology, ID, nephrology, hematology were consulted and following .  ID signed off recommending 6 weeks of IV antibiotics.  He will get antibiotics during dialysis.  Cardiology will follow him as an outpatient.  Medically stable for discharge home today.  Following problems were addressed during the hospitalization:  Gram-positive bacteremia: Blood cultures showed Staph epidermidis.  Currently on cefazolin.  TTE did not show any valvular vegetation.  ID consulted.  Currently afebrile, hemodynamically stable.  Leukocytosis improving.  ID recommended 6 weeks of IV antibiotics, given during dialysis   History of coronary artery disease status post CABG/PCI/atypical chest pain :Cardiology following.  Troponin elevated with flat trend.  Currently on aspirin.  He will follow-up with cardiology as an outpatient.  Continue nitrates as needed   Aortic stenosis: TEE showed severely dilated left atrium, moderate aortic stenosis.  Cardiology will follow-up as an outpatient   Thrombocytopenia: Chronic, baseline  around 29528.  Worsening secondary to sepsis, bacteremia.  Hematology following.  No need for additional workup.  Platelets level improving.   ESRD on dialysis: Gets dialysis once a week.  Still makes urine.  Nephrology following.  Now will be on 3 times dialysis a week.   Hypertension: On amlodipine, hydralazine.  Monitor blood pressure at home   GERD: Continue PPI   Acute bronchitis: Currently on prednisone taper.   Currently on room air.   Discharge Diagnoses:  Principal Problem:   Acute febrile illness Active Problems:   Coronary artery disease involving native coronary artery of native heart with angina pectoris (HCC)   Tobacco use   Aortic valve disease   Thrombocytopenia (HCC)   GERD without esophagitis   Essential hypertension   Mixed hyperlipidemia   Chest pain   COPD (chronic obstructive pulmonary disease) (HCC)   End-stage renal disease on hemodialysis (HCC)   SIRS (systemic inflammatory response syndrome) (HCC)   Acute bronchitis   Chronic heart failure with preserved ejection fraction (HFpEF) (HCC)   Elevated troponin   Weight loss   Protein-calorie malnutrition, severe    Discharge Instructions  Discharge Instructions     Diet - low sodium heart healthy   Complete by: As directed    Renal diet   Discharge instructions   Complete by: As directed    1)Please take prescribed medications as instructed 2)Please continue dialysis 3 times a week, get antibiotics during dialysis 3)You will be called by cardiology and infectious disease for follow-up appointment   Home infusion instructions   Complete by: As directed    To be given at the patient's hemodialysis center   Instructions: Flushing of vascular access device: 0.9% NaCl pre/post medication administration and prn patency; Heparin 100 u/ml, 5ml for implanted ports and Heparin 10u/ml, 5ml for all other central venous  the left side of the neck. 11.  Emphysema (ICD10-J43.9). Electronically Signed   By: Marin Roberts M.D.   On: 12/19/2022 12:53   VAS US DUPLEX DIALYSIS ACCESS (AVF, AVG)  Result Date: 12/18/2022 DIALYSIS ACCESS Patient Name:  [name]  Date of Exam:   12/18/2022 Medical Rec #: [id number]      Accession #:    [id number] Date of Birth: 1970-10-17       Patient Gender: M Patient Age:   60 years Exam Location:  [facility] Procedure:      VAS US DUPLEX DIALYSIS ACCESS (AVF, AVG) Referring Phys: Emilie Rutter --------------------------------------------------------------------------------  Reason for Exam: Edema left upper extremity. Access Site: Left Upper Extremity. Access Type: Basilic vein transposition 2nd stage 10/09/2020. History: 07/18/2021 Angioplasty of left brachiocephalic vein stenosis.           Angioplasty of left subclavian vein with a 10 x 60 Mustang balloon.          Angioplasty of the left basilic vein transposition with an 8 x 10          Mustang balloon and a 7 x 2 Mustang balloon Performing Technologist: Elita Quick RVT  Examination Guidelines: A complete evaluation includes B-mode imaging, spectral Doppler, color Doppler, and power Doppler as needed of all accessible portions of each vessel. Unilateral testing is considered an integral part of a complete examination. Limited examinations for reoccurring indications may be performed as noted.  Findings: +--------------------+----------+-----------------+--------+ AVF                  PSV (cm/s)Flow Vol (mL/min)Comments +--------------------+----------+-----------------+--------+ Native artery inflow   208          2399                +--------------------+----------+-----------------+--------+ AVF Anastomosis        205                              +--------------------+----------+-----------------+--------+  +-------------+----------+-------------+-----------+--------+ OUTFLOW VEIN PSV (cm/s)Diameter (cm)Depth (cm) Describe +-------------+----------+-------------+-----------+--------+ Axillary vein    92        1.35        0.25             +-------------+----------+-------------+-----------+--------+ Prox UA          97        1.63        0.17             +-------------+----------+-------------+-----------+--------+ Mid UA       320 / 192  0.58 / 1.87 0.34 / 0.22         +-------------+----------+-------------+-----------+--------+ Dist UA         586        0.63        0.22             +-------------+----------+-------------+-----------+--------+   Summary: Patent aneurysmal Basilic vein transposition.  *See table(s) above for measurements and observations.  Diagnosing physician: Carolynn Sayers Electronically signed by Carolynn Sayers on 12/18/2022 at 5:10:57 PM.   --------------------------------------------------------------------------------   Final       Subjective: Patient seen and examined the bedside today.  Hemodynamically stable.  On room air.  Complains of some chest pain and headache today.  Case discussed with cardiology, cardiology cleared for discharge and will follow as an outpatient.  He is going for outpatient dialysis today.  Discharge Exam: Vitals:   01/13/23  the left side of the neck. 11.  Emphysema (ICD10-J43.9). Electronically Signed   By: Marin Roberts M.D.   On: 12/19/2022 12:53   VAS US DUPLEX DIALYSIS ACCESS (AVF, AVG)  Result Date: 12/18/2022 DIALYSIS ACCESS Patient Name:  [name]  Date of Exam:   12/18/2022 Medical Rec #: [id number]      Accession #:    [id number] Date of Birth: 1970-10-17       Patient Gender: M Patient Age:   60 years Exam Location:  [facility] Procedure:      VAS US DUPLEX DIALYSIS ACCESS (AVF, AVG) Referring Phys: Emilie Rutter --------------------------------------------------------------------------------  Reason for Exam: Edema left upper extremity. Access Site: Left Upper Extremity. Access Type: Basilic vein transposition 2nd stage 10/09/2020. History: 07/18/2021 Angioplasty of left brachiocephalic vein stenosis.           Angioplasty of left subclavian vein with a 10 x 60 Mustang balloon.          Angioplasty of the left basilic vein transposition with an 8 x 10          Mustang balloon and a 7 x 2 Mustang balloon Performing Technologist: Elita Quick RVT  Examination Guidelines: A complete evaluation includes B-mode imaging, spectral Doppler, color Doppler, and power Doppler as needed of all accessible portions of each vessel. Unilateral testing is considered an integral part of a complete examination. Limited examinations for reoccurring indications may be performed as noted.  Findings: +--------------------+----------+-----------------+--------+ AVF                  PSV (cm/s)Flow Vol (mL/min)Comments +--------------------+----------+-----------------+--------+ Native artery inflow   208          2399                +--------------------+----------+-----------------+--------+ AVF Anastomosis        205                              +--------------------+----------+-----------------+--------+  +-------------+----------+-------------+-----------+--------+ OUTFLOW VEIN PSV (cm/s)Diameter (cm)Depth (cm) Describe +-------------+----------+-------------+-----------+--------+ Axillary vein    92        1.35        0.25             +-------------+----------+-------------+-----------+--------+ Prox UA          97        1.63        0.17             +-------------+----------+-------------+-----------+--------+ Mid UA       320 / 192  0.58 / 1.87 0.34 / 0.22         +-------------+----------+-------------+-----------+--------+ Dist UA         586        0.63        0.22             +-------------+----------+-------------+-----------+--------+   Summary: Patent aneurysmal Basilic vein transposition.  *See table(s) above for measurements and observations.  Diagnosing physician: Carolynn Sayers Electronically signed by Carolynn Sayers on 12/18/2022 at 5:10:57 PM.   --------------------------------------------------------------------------------   Final       Subjective: Patient seen and examined the bedside today.  Hemodynamically stable.  On room air.  Complains of some chest pain and headache today.  Case discussed with cardiology, cardiology cleared for discharge and will follow as an outpatient.  He is going for outpatient dialysis today.  Discharge Exam: Vitals:   01/13/23  Physician Discharge Summary  [name]:[id number] DOB: 02/03/1971 DOA: 01/09/2023  PCP: Loura Back, NP  Admit date: 01/09/2023 Discharge date: 01/14/2023  Admitted From: Home Disposition:  Home  Discharge Condition:Stable CODE STATUS:FULL Diet recommendation: Renal  Brief/Interim Summary: Patient is a 52 year old male with history of hypertension, hyperlipidemia, coronary artery disease status post CABG, MIs, CVA, ESRD on hemodialysis once a week, descending thoracic aortic aneurysm, history of aortic valve replacement with bioprosthetic valve, severe left ventricular venous distention, tobacco use who presented to the Emergency Department with complaint of 2-week history of midsternal chest pain.  Blood cultures came out a positive for Staph epidermidis.  Hospital course remarkable for worsening thrombocytopenia.  Echo also showed moderate/severe stenosis of the posterior aortic valve.  Cardiology, ID, nephrology, hematology were consulted and following .  ID signed off recommending 6 weeks of IV antibiotics.  He will get antibiotics during dialysis.  Cardiology will follow him as an outpatient.  Medically stable for discharge home today.  Following problems were addressed during the hospitalization:  Gram-positive bacteremia: Blood cultures showed Staph epidermidis.  Currently on cefazolin.  TTE did not show any valvular vegetation.  ID consulted.  Currently afebrile, hemodynamically stable.  Leukocytosis improving.  ID recommended 6 weeks of IV antibiotics, given during dialysis   History of coronary artery disease status post CABG/PCI/atypical chest pain :Cardiology following.  Troponin elevated with flat trend.  Currently on aspirin.  He will follow-up with cardiology as an outpatient.  Continue nitrates as needed   Aortic stenosis: TEE showed severely dilated left atrium, moderate aortic stenosis.  Cardiology will follow-up as an outpatient   Thrombocytopenia: Chronic, baseline  around 29528.  Worsening secondary to sepsis, bacteremia.  Hematology following.  No need for additional workup.  Platelets level improving.   ESRD on dialysis: Gets dialysis once a week.  Still makes urine.  Nephrology following.  Now will be on 3 times dialysis a week.   Hypertension: On amlodipine, hydralazine.  Monitor blood pressure at home   GERD: Continue PPI   Acute bronchitis: Currently on prednisone taper.   Currently on room air.   Discharge Diagnoses:  Principal Problem:   Acute febrile illness Active Problems:   Coronary artery disease involving native coronary artery of native heart with angina pectoris (HCC)   Tobacco use   Aortic valve disease   Thrombocytopenia (HCC)   GERD without esophagitis   Essential hypertension   Mixed hyperlipidemia   Chest pain   COPD (chronic obstructive pulmonary disease) (HCC)   End-stage renal disease on hemodialysis (HCC)   SIRS (systemic inflammatory response syndrome) (HCC)   Acute bronchitis   Chronic heart failure with preserved ejection fraction (HFpEF) (HCC)   Elevated troponin   Weight loss   Protein-calorie malnutrition, severe    Discharge Instructions  Discharge Instructions     Diet - low sodium heart healthy   Complete by: As directed    Renal diet   Discharge instructions   Complete by: As directed    1)Please take prescribed medications as instructed 2)Please continue dialysis 3 times a week, get antibiotics during dialysis 3)You will be called by cardiology and infectious disease for follow-up appointment   Home infusion instructions   Complete by: As directed    To be given at the patient's hemodialysis center   Instructions: Flushing of vascular access device: 0.9% NaCl pre/post medication administration and prn patency; Heparin 100 u/ml, 5ml for implanted ports and Heparin 10u/ml, 5ml for all other central venous  Date/Time: 01/13/2023/11:14:53 AM    Final    EP  STUDY  Result Date: 01/13/2023 See surgical note for result.  MR LUMBAR SPINE WO CONTRAST  Result Date: 01/11/2023 CLINICAL DATA:  infection EXAM: MRI LUMBAR SPINE WITHOUT CONTRAST TECHNIQUE: Multiplanar, multisequence MR imaging of the lumbar spine was performed. No intravenous contrast was administered. COMPARISON:  None Available. FINDINGS: Segmentation:  Standard. Alignment:  Physiologic. Vertebrae: No abnormal bone marrow edema to suggest discitis/osteomyelitis. Diffusely low STIR and T2 signal is likely related to patient's iron injection. Conus medullaris and cauda equina: Conus extends to the T12 level. Conus and cauda equina appear normal. Paraspinal and other soft tissues: No appreciable paraspinal edema. Known infrarenal abdominal aortic aneurysm better characterized on recent CT of the chest/abdomen/pelvis. Disc levels: T12-L1: No significant disc protrusion, foraminal stenosis, or canal stenosis. L1-L2: No significant disc protrusion, foraminal stenosis, or canal stenosis. L2-L3: No significant disc protrusion, foraminal stenosis, or canal stenosis. L3-L4: Mild disc height loss and desiccation. No significant disc protrusion, foraminal stenosis, or canal stenosis. L4-L5: Mild disc height loss and desiccation. Small disc bulge and annular fissure. No significant disc protrusion, foraminal stenosis, or canal stenosis. L5-S1: Mild disc height loss and desiccation. No significant disc protrusion, foraminal stenosis, or canal stenosis. IMPRESSION: 1. No obvious evidence of discitis/osteomyelitis. Postcontrast imaging could provide more sensitive evaluation if clinically warranted. 2. Mild lower lumbar degenerative change without significant stenosis. 3. Abnormal bone marrow signal diffusely is likely related to the patient's iron infusions. 4. Known infrarenal abdominal aortic aneurysm better characterized on recent CT of the chest/abdomen/pelvis. Electronically Signed   By: Feliberto Harts M.D.    On: 01/11/2023 17:47   ECHOCARDIOGRAM COMPLETE  Result Date: 01/10/2023    ECHOCARDIOGRAM REPORT   Patient Name:   [name] Date of Exam: 01/10/2023 Medical Rec #:  [id number]     Height:       71.0 in Accession #:    [id number]    Weight:       132.0 lb Date of Birth:  July 05, 1970      BSA:          1.767 m Patient Age:    52 years      BP:           143/78 mmHg Patient Gender: M             HR:           86 bpm. Exam Location:  Inpatient Procedure: 2D Echo, Cardiac Doppler and Color Doppler Indications:     CHF-Acute Diastolic I50.31  History:         Patient has prior history of Echocardiogram examinations, most                  recent 04/15/2022. Cardiomyopathy, CAD and Previous Myocardial                  Infarction, ETOH abuse, Stroke and COPD, Aortic Valve Disease                  and Mitral Valve Disease; Risk Factors:Current Smoker,                  Hypertension and Dyslipidemia. Aortic dissection repair.  Sonographer:     Dondra Prader RVT RCS Referring Phys:  [id number] [facility] ADEFESO Diagnosing Phys: Lennie Odor MD IMPRESSIONS  1. Left ventricular ejection fraction, by estimation, is 55 to 60%. The left ventricle has normal function. The left ventricle  Physician Discharge Summary  [name]:[id number] DOB: 02/03/1971 DOA: 01/09/2023  PCP: Loura Back, NP  Admit date: 01/09/2023 Discharge date: 01/14/2023  Admitted From: Home Disposition:  Home  Discharge Condition:Stable CODE STATUS:FULL Diet recommendation: Renal  Brief/Interim Summary: Patient is a 52 year old male with history of hypertension, hyperlipidemia, coronary artery disease status post CABG, MIs, CVA, ESRD on hemodialysis once a week, descending thoracic aortic aneurysm, history of aortic valve replacement with bioprosthetic valve, severe left ventricular venous distention, tobacco use who presented to the Emergency Department with complaint of 2-week history of midsternal chest pain.  Blood cultures came out a positive for Staph epidermidis.  Hospital course remarkable for worsening thrombocytopenia.  Echo also showed moderate/severe stenosis of the posterior aortic valve.  Cardiology, ID, nephrology, hematology were consulted and following .  ID signed off recommending 6 weeks of IV antibiotics.  He will get antibiotics during dialysis.  Cardiology will follow him as an outpatient.  Medically stable for discharge home today.  Following problems were addressed during the hospitalization:  Gram-positive bacteremia: Blood cultures showed Staph epidermidis.  Currently on cefazolin.  TTE did not show any valvular vegetation.  ID consulted.  Currently afebrile, hemodynamically stable.  Leukocytosis improving.  ID recommended 6 weeks of IV antibiotics, given during dialysis   History of coronary artery disease status post CABG/PCI/atypical chest pain :Cardiology following.  Troponin elevated with flat trend.  Currently on aspirin.  He will follow-up with cardiology as an outpatient.  Continue nitrates as needed   Aortic stenosis: TEE showed severely dilated left atrium, moderate aortic stenosis.  Cardiology will follow-up as an outpatient   Thrombocytopenia: Chronic, baseline  around 29528.  Worsening secondary to sepsis, bacteremia.  Hematology following.  No need for additional workup.  Platelets level improving.   ESRD on dialysis: Gets dialysis once a week.  Still makes urine.  Nephrology following.  Now will be on 3 times dialysis a week.   Hypertension: On amlodipine, hydralazine.  Monitor blood pressure at home   GERD: Continue PPI   Acute bronchitis: Currently on prednisone taper.   Currently on room air.   Discharge Diagnoses:  Principal Problem:   Acute febrile illness Active Problems:   Coronary artery disease involving native coronary artery of native heart with angina pectoris (HCC)   Tobacco use   Aortic valve disease   Thrombocytopenia (HCC)   GERD without esophagitis   Essential hypertension   Mixed hyperlipidemia   Chest pain   COPD (chronic obstructive pulmonary disease) (HCC)   End-stage renal disease on hemodialysis (HCC)   SIRS (systemic inflammatory response syndrome) (HCC)   Acute bronchitis   Chronic heart failure with preserved ejection fraction (HFpEF) (HCC)   Elevated troponin   Weight loss   Protein-calorie malnutrition, severe    Discharge Instructions  Discharge Instructions     Diet - low sodium heart healthy   Complete by: As directed    Renal diet   Discharge instructions   Complete by: As directed    1)Please take prescribed medications as instructed 2)Please continue dialysis 3 times a week, get antibiotics during dialysis 3)You will be called by cardiology and infectious disease for follow-up appointment   Home infusion instructions   Complete by: As directed    To be given at the patient's hemodialysis center   Instructions: Flushing of vascular access device: 0.9% NaCl pre/post medication administration and prn patency; Heparin 100 u/ml, 5ml for implanted ports and Heparin 10u/ml, 5ml for all other central venous  Physician Discharge Summary  [name]:[id number] DOB: 02/03/1971 DOA: 01/09/2023  PCP: Loura Back, NP  Admit date: 01/09/2023 Discharge date: 01/14/2023  Admitted From: Home Disposition:  Home  Discharge Condition:Stable CODE STATUS:FULL Diet recommendation: Renal  Brief/Interim Summary: Patient is a 52 year old male with history of hypertension, hyperlipidemia, coronary artery disease status post CABG, MIs, CVA, ESRD on hemodialysis once a week, descending thoracic aortic aneurysm, history of aortic valve replacement with bioprosthetic valve, severe left ventricular venous distention, tobacco use who presented to the Emergency Department with complaint of 2-week history of midsternal chest pain.  Blood cultures came out a positive for Staph epidermidis.  Hospital course remarkable for worsening thrombocytopenia.  Echo also showed moderate/severe stenosis of the posterior aortic valve.  Cardiology, ID, nephrology, hematology were consulted and following .  ID signed off recommending 6 weeks of IV antibiotics.  He will get antibiotics during dialysis.  Cardiology will follow him as an outpatient.  Medically stable for discharge home today.  Following problems were addressed during the hospitalization:  Gram-positive bacteremia: Blood cultures showed Staph epidermidis.  Currently on cefazolin.  TTE did not show any valvular vegetation.  ID consulted.  Currently afebrile, hemodynamically stable.  Leukocytosis improving.  ID recommended 6 weeks of IV antibiotics, given during dialysis   History of coronary artery disease status post CABG/PCI/atypical chest pain :Cardiology following.  Troponin elevated with flat trend.  Currently on aspirin.  He will follow-up with cardiology as an outpatient.  Continue nitrates as needed   Aortic stenosis: TEE showed severely dilated left atrium, moderate aortic stenosis.  Cardiology will follow-up as an outpatient   Thrombocytopenia: Chronic, baseline  around 29528.  Worsening secondary to sepsis, bacteremia.  Hematology following.  No need for additional workup.  Platelets level improving.   ESRD on dialysis: Gets dialysis once a week.  Still makes urine.  Nephrology following.  Now will be on 3 times dialysis a week.   Hypertension: On amlodipine, hydralazine.  Monitor blood pressure at home   GERD: Continue PPI   Acute bronchitis: Currently on prednisone taper.   Currently on room air.   Discharge Diagnoses:  Principal Problem:   Acute febrile illness Active Problems:   Coronary artery disease involving native coronary artery of native heart with angina pectoris (HCC)   Tobacco use   Aortic valve disease   Thrombocytopenia (HCC)   GERD without esophagitis   Essential hypertension   Mixed hyperlipidemia   Chest pain   COPD (chronic obstructive pulmonary disease) (HCC)   End-stage renal disease on hemodialysis (HCC)   SIRS (systemic inflammatory response syndrome) (HCC)   Acute bronchitis   Chronic heart failure with preserved ejection fraction (HFpEF) (HCC)   Elevated troponin   Weight loss   Protein-calorie malnutrition, severe    Discharge Instructions  Discharge Instructions     Diet - low sodium heart healthy   Complete by: As directed    Renal diet   Discharge instructions   Complete by: As directed    1)Please take prescribed medications as instructed 2)Please continue dialysis 3 times a week, get antibiotics during dialysis 3)You will be called by cardiology and infectious disease for follow-up appointment   Home infusion instructions   Complete by: As directed    To be given at the patient's hemodialysis center   Instructions: Flushing of vascular access device: 0.9% NaCl pre/post medication administration and prn patency; Heparin 100 u/ml, 5ml for implanted ports and Heparin 10u/ml, 5ml for all other central venous  Date/Time: 01/13/2023/11:14:53 AM    Final    EP  STUDY  Result Date: 01/13/2023 See surgical note for result.  MR LUMBAR SPINE WO CONTRAST  Result Date: 01/11/2023 CLINICAL DATA:  infection EXAM: MRI LUMBAR SPINE WITHOUT CONTRAST TECHNIQUE: Multiplanar, multisequence MR imaging of the lumbar spine was performed. No intravenous contrast was administered. COMPARISON:  None Available. FINDINGS: Segmentation:  Standard. Alignment:  Physiologic. Vertebrae: No abnormal bone marrow edema to suggest discitis/osteomyelitis. Diffusely low STIR and T2 signal is likely related to patient's iron injection. Conus medullaris and cauda equina: Conus extends to the T12 level. Conus and cauda equina appear normal. Paraspinal and other soft tissues: No appreciable paraspinal edema. Known infrarenal abdominal aortic aneurysm better characterized on recent CT of the chest/abdomen/pelvis. Disc levels: T12-L1: No significant disc protrusion, foraminal stenosis, or canal stenosis. L1-L2: No significant disc protrusion, foraminal stenosis, or canal stenosis. L2-L3: No significant disc protrusion, foraminal stenosis, or canal stenosis. L3-L4: Mild disc height loss and desiccation. No significant disc protrusion, foraminal stenosis, or canal stenosis. L4-L5: Mild disc height loss and desiccation. Small disc bulge and annular fissure. No significant disc protrusion, foraminal stenosis, or canal stenosis. L5-S1: Mild disc height loss and desiccation. No significant disc protrusion, foraminal stenosis, or canal stenosis. IMPRESSION: 1. No obvious evidence of discitis/osteomyelitis. Postcontrast imaging could provide more sensitive evaluation if clinically warranted. 2. Mild lower lumbar degenerative change without significant stenosis. 3. Abnormal bone marrow signal diffusely is likely related to the patient's iron infusions. 4. Known infrarenal abdominal aortic aneurysm better characterized on recent CT of the chest/abdomen/pelvis. Electronically Signed   By: Feliberto Harts M.D.    On: 01/11/2023 17:47   ECHOCARDIOGRAM COMPLETE  Result Date: 01/10/2023    ECHOCARDIOGRAM REPORT   Patient Name:   [name] Date of Exam: 01/10/2023 Medical Rec #:  [id number]     Height:       71.0 in Accession #:    [id number]    Weight:       132.0 lb Date of Birth:  July 05, 1970      BSA:          1.767 m Patient Age:    52 years      BP:           143/78 mmHg Patient Gender: M             HR:           86 bpm. Exam Location:  Inpatient Procedure: 2D Echo, Cardiac Doppler and Color Doppler Indications:     CHF-Acute Diastolic I50.31  History:         Patient has prior history of Echocardiogram examinations, most                  recent 04/15/2022. Cardiomyopathy, CAD and Previous Myocardial                  Infarction, ETOH abuse, Stroke and COPD, Aortic Valve Disease                  and Mitral Valve Disease; Risk Factors:Current Smoker,                  Hypertension and Dyslipidemia. Aortic dissection repair.  Sonographer:     Dondra Prader RVT RCS Referring Phys:  [id number] [facility] Diagnosing Phys: Lennie Odor MD IMPRESSIONS  1. Left ventricular ejection fraction, by estimation, is 55 to 60%. The left ventricle has normal function. The left ventricle  the left side of the neck. 11.  Emphysema (ICD10-J43.9). Electronically Signed   By: Marin Roberts M.D.   On: 12/19/2022 12:53   VAS US DUPLEX DIALYSIS ACCESS (AVF, AVG)  Result Date: 12/18/2022 DIALYSIS ACCESS Patient Name:  [name]  Date of Exam:   12/18/2022 Medical Rec #: [id number]      Accession #:    [id number] Date of Birth: 1970-10-17       Patient Gender: M Patient Age:   60 years Exam Location:  [facility] Procedure:      VAS US DUPLEX DIALYSIS ACCESS (AVF, AVG) Referring Phys: Emilie Rutter --------------------------------------------------------------------------------  Reason for Exam: Edema left upper extremity. Access Site: Left Upper Extremity. Access Type: Basilic vein transposition 2nd stage 10/09/2020. History: 07/18/2021 Angioplasty of left brachiocephalic vein stenosis.           Angioplasty of left subclavian vein with a 10 x 60 Mustang balloon.          Angioplasty of the left basilic vein transposition with an 8 x 10          Mustang balloon and a 7 x 2 Mustang balloon Performing Technologist: Elita Quick RVT  Examination Guidelines: A complete evaluation includes B-mode imaging, spectral Doppler, color Doppler, and power Doppler as needed of all accessible portions of each vessel. Unilateral testing is considered an integral part of a complete examination. Limited examinations for reoccurring indications may be performed as noted.  Findings: +--------------------+----------+-----------------+--------+ AVF                  PSV (cm/s)Flow Vol (mL/min)Comments +--------------------+----------+-----------------+--------+ Native artery inflow   208          2399                +--------------------+----------+-----------------+--------+ AVF Anastomosis        205                              +--------------------+----------+-----------------+--------+  +-------------+----------+-------------+-----------+--------+ OUTFLOW VEIN PSV (cm/s)Diameter (cm)Depth (cm) Describe +-------------+----------+-------------+-----------+--------+ Axillary vein    92        1.35        0.25             +-------------+----------+-------------+-----------+--------+ Prox UA          97        1.63        0.17             +-------------+----------+-------------+-----------+--------+ Mid UA       320 / 192  0.58 / 1.87 0.34 / 0.22         +-------------+----------+-------------+-----------+--------+ Dist UA         586        0.63        0.22             +-------------+----------+-------------+-----------+--------+   Summary: Patent aneurysmal Basilic vein transposition.  *See table(s) above for measurements and observations.  Diagnosing physician: Carolynn Sayers Electronically signed by Carolynn Sayers on 12/18/2022 at 5:10:57 PM.   --------------------------------------------------------------------------------   Final       Subjective: Patient seen and examined the bedside today.  Hemodynamically stable.  On room air.  Complains of some chest pain and headache today.  Case discussed with cardiology, cardiology cleared for discharge and will follow as an outpatient.  He is going for outpatient dialysis today.  Discharge Exam: Vitals:   01/13/23  Physician Discharge Summary  [name]:[id number] DOB: 02/03/1971 DOA: 01/09/2023  PCP: Loura Back, NP  Admit date: 01/09/2023 Discharge date: 01/14/2023  Admitted From: Home Disposition:  Home  Discharge Condition:Stable CODE STATUS:FULL Diet recommendation: Renal  Brief/Interim Summary: Patient is a 52 year old male with history of hypertension, hyperlipidemia, coronary artery disease status post CABG, MIs, CVA, ESRD on hemodialysis once a week, descending thoracic aortic aneurysm, history of aortic valve replacement with bioprosthetic valve, severe left ventricular venous distention, tobacco use who presented to the Emergency Department with complaint of 2-week history of midsternal chest pain.  Blood cultures came out a positive for Staph epidermidis.  Hospital course remarkable for worsening thrombocytopenia.  Echo also showed moderate/severe stenosis of the posterior aortic valve.  Cardiology, ID, nephrology, hematology were consulted and following .  ID signed off recommending 6 weeks of IV antibiotics.  He will get antibiotics during dialysis.  Cardiology will follow him as an outpatient.  Medically stable for discharge home today.  Following problems were addressed during the hospitalization:  Gram-positive bacteremia: Blood cultures showed Staph epidermidis.  Currently on cefazolin.  TTE did not show any valvular vegetation.  ID consulted.  Currently afebrile, hemodynamically stable.  Leukocytosis improving.  ID recommended 6 weeks of IV antibiotics, given during dialysis   History of coronary artery disease status post CABG/PCI/atypical chest pain :Cardiology following.  Troponin elevated with flat trend.  Currently on aspirin.  He will follow-up with cardiology as an outpatient.  Continue nitrates as needed   Aortic stenosis: TEE showed severely dilated left atrium, moderate aortic stenosis.  Cardiology will follow-up as an outpatient   Thrombocytopenia: Chronic, baseline  around 29528.  Worsening secondary to sepsis, bacteremia.  Hematology following.  No need for additional workup.  Platelets level improving.   ESRD on dialysis: Gets dialysis once a week.  Still makes urine.  Nephrology following.  Now will be on 3 times dialysis a week.   Hypertension: On amlodipine, hydralazine.  Monitor blood pressure at home   GERD: Continue PPI   Acute bronchitis: Currently on prednisone taper.   Currently on room air.   Discharge Diagnoses:  Principal Problem:   Acute febrile illness Active Problems:   Coronary artery disease involving native coronary artery of native heart with angina pectoris (HCC)   Tobacco use   Aortic valve disease   Thrombocytopenia (HCC)   GERD without esophagitis   Essential hypertension   Mixed hyperlipidemia   Chest pain   COPD (chronic obstructive pulmonary disease) (HCC)   End-stage renal disease on hemodialysis (HCC)   SIRS (systemic inflammatory response syndrome) (HCC)   Acute bronchitis   Chronic heart failure with preserved ejection fraction (HFpEF) (HCC)   Elevated troponin   Weight loss   Protein-calorie malnutrition, severe    Discharge Instructions  Discharge Instructions     Diet - low sodium heart healthy   Complete by: As directed    Renal diet   Discharge instructions   Complete by: As directed    1)Please take prescribed medications as instructed 2)Please continue dialysis 3 times a week, get antibiotics during dialysis 3)You will be called by cardiology and infectious disease for follow-up appointment   Home infusion instructions   Complete by: As directed    To be given at the patient's hemodialysis center   Instructions: Flushing of vascular access device: 0.9% NaCl pre/post medication administration and prn patency; Heparin 100 u/ml, 5ml for implanted ports and Heparin 10u/ml, 5ml for all other central venous  Date/Time: 01/13/2023/11:14:53 AM    Final    EP  STUDY  Result Date: 01/13/2023 See surgical note for result.  MR LUMBAR SPINE WO CONTRAST  Result Date: 01/11/2023 CLINICAL DATA:  infection EXAM: MRI LUMBAR SPINE WITHOUT CONTRAST TECHNIQUE: Multiplanar, multisequence MR imaging of the lumbar spine was performed. No intravenous contrast was administered. COMPARISON:  None Available. FINDINGS: Segmentation:  Standard. Alignment:  Physiologic. Vertebrae: No abnormal bone marrow edema to suggest discitis/osteomyelitis. Diffusely low STIR and T2 signal is likely related to patient's iron injection. Conus medullaris and cauda equina: Conus extends to the T12 level. Conus and cauda equina appear normal. Paraspinal and other soft tissues: No appreciable paraspinal edema. Known infrarenal abdominal aortic aneurysm better characterized on recent CT of the chest/abdomen/pelvis. Disc levels: T12-L1: No significant disc protrusion, foraminal stenosis, or canal stenosis. L1-L2: No significant disc protrusion, foraminal stenosis, or canal stenosis. L2-L3: No significant disc protrusion, foraminal stenosis, or canal stenosis. L3-L4: Mild disc height loss and desiccation. No significant disc protrusion, foraminal stenosis, or canal stenosis. L4-L5: Mild disc height loss and desiccation. Small disc bulge and annular fissure. No significant disc protrusion, foraminal stenosis, or canal stenosis. L5-S1: Mild disc height loss and desiccation. No significant disc protrusion, foraminal stenosis, or canal stenosis. IMPRESSION: 1. No obvious evidence of discitis/osteomyelitis. Postcontrast imaging could provide more sensitive evaluation if clinically warranted. 2. Mild lower lumbar degenerative change without significant stenosis. 3. Abnormal bone marrow signal diffusely is likely related to the patient's iron infusions. 4. Known infrarenal abdominal aortic aneurysm better characterized on recent CT of the chest/abdomen/pelvis. Electronically Signed   By: Feliberto Harts M.D.    On: 01/11/2023 17:47   ECHOCARDIOGRAM COMPLETE  Result Date: 01/10/2023    ECHOCARDIOGRAM REPORT   Patient Name:   [name] Date of Exam: 01/10/2023 Medical Rec #:  [id number]     Height:       71.0 in Accession #:    [id number]    Weight:       132.0 lb Date of Birth:  July 05, 1970      BSA:          1.767 m Patient Age:    52 years      BP:           143/78 mmHg Patient Gender: M             HR:           86 bpm. Exam Location:  Inpatient Procedure: 2D Echo, Cardiac Doppler and Color Doppler Indications:     CHF-Acute Diastolic I50.31  History:         Patient has prior history of Echocardiogram examinations, most                  recent 04/15/2022. Cardiomyopathy, CAD and Previous Myocardial                  Infarction, ETOH abuse, Stroke and COPD, Aortic Valve Disease                  and Mitral Valve Disease; Risk Factors:Current Smoker,                  Hypertension and Dyslipidemia. Aortic dissection repair.  Sonographer:     Dondra Prader RVT RCS Referring Phys:  [id number] [facility] Diagnosing Phys: Lennie Odor MD IMPRESSIONS  1. Left ventricular ejection fraction, by estimation, is 55 to 60%. The left ventricle has normal function. The left ventricle  the left side of the neck. 11.  Emphysema (ICD10-J43.9). Electronically Signed   By: Marin Roberts M.D.   On: 12/19/2022 12:53   VAS US DUPLEX DIALYSIS ACCESS (AVF, AVG)  Result Date: 12/18/2022 DIALYSIS ACCESS Patient Name:  [name]  Date of Exam:   12/18/2022 Medical Rec #: [id number]      Accession #:    [id number] Date of Birth: 1970-10-17       Patient Gender: M Patient Age:   60 years Exam Location:  [facility] Procedure:      VAS US DUPLEX DIALYSIS ACCESS (AVF, AVG) Referring Phys: Emilie Rutter --------------------------------------------------------------------------------  Reason for Exam: Edema left upper extremity. Access Site: Left Upper Extremity. Access Type: Basilic vein transposition 2nd stage 10/09/2020. History: 07/18/2021 Angioplasty of left brachiocephalic vein stenosis.           Angioplasty of left subclavian vein with a 10 x 60 Mustang balloon.          Angioplasty of the left basilic vein transposition with an 8 x 10          Mustang balloon and a 7 x 2 Mustang balloon Performing Technologist: Elita Quick RVT  Examination Guidelines: A complete evaluation includes B-mode imaging, spectral Doppler, color Doppler, and power Doppler as needed of all accessible portions of each vessel. Unilateral testing is considered an integral part of a complete examination. Limited examinations for reoccurring indications may be performed as noted.  Findings: +--------------------+----------+-----------------+--------+ AVF                  PSV (cm/s)Flow Vol (mL/min)Comments +--------------------+----------+-----------------+--------+ Native artery inflow   208          2399                +--------------------+----------+-----------------+--------+ AVF Anastomosis        205                              +--------------------+----------+-----------------+--------+  +-------------+----------+-------------+-----------+--------+ OUTFLOW VEIN PSV (cm/s)Diameter (cm)Depth (cm) Describe +-------------+----------+-------------+-----------+--------+ Axillary vein    92        1.35        0.25             +-------------+----------+-------------+-----------+--------+ Prox UA          97        1.63        0.17             +-------------+----------+-------------+-----------+--------+ Mid UA       320 / 192  0.58 / 1.87 0.34 / 0.22         +-------------+----------+-------------+-----------+--------+ Dist UA         586        0.63        0.22             +-------------+----------+-------------+-----------+--------+   Summary: Patent aneurysmal Basilic vein transposition.  *See table(s) above for measurements and observations.  Diagnosing physician: Carolynn Sayers Electronically signed by Carolynn Sayers on 12/18/2022 at 5:10:57 PM.   --------------------------------------------------------------------------------   Final       Subjective: Patient seen and examined the bedside today.  Hemodynamically stable.  On room air.  Complains of some chest pain and headache today.  Case discussed with cardiology, cardiology cleared for discharge and will follow as an outpatient.  He is going for outpatient dialysis today.  Discharge Exam: Vitals:   01/13/23  01/14/23 0000  Home infusion instructions       Comments: To be given at the patient's hemodialysis center  Question:  Instructions  Answer:  Flushing of vascular access device: 0.9% NaCl pre/post medication administration and prn patency; Heparin 100 u/ml, 5ml for implanted ports and Heparin 10u/ml, 5ml for all other central venous catheters.   01/14/23 4403            Follow-up Information     [place] Go on 01/14/2023.   Why: On 10/31, please arrive at 12:20 pm for 12:35 pm chair time (10/31 only).  Regular schedule will be Tuesday, Thursday, Saturday with 6:50 am chair time. Contact information: [address] [place] [address] [phone]         Loura Back, NP.  Schedule an appointment as soon as possible for a visit in 1 week(s).   Specialty: Nurse Practitioner Contact information: [address] [place] [address] [phone]                Allergies  Allergen Reactions   Clonidine Derivatives Nausea And Vomiting and Other (See Comments)    Tablets by mouth = Tolerated  Patches on the skin = Nausea & vomiting after wearing 4-5 days    Imdur [Isosorbide Nitrate] Other (See Comments)    Headaches -- patient instructed to continue taking    Tramadol Other (See Comments)    Headaches     Consultations: Nephrology, ID, cardiology, hematology   Procedures/Studies: ECHO TEE  Result Date: 01/13/2023    TRANSESOPHOGEAL ECHO REPORT   Patient Name:   [name] Date of Exam: 01/13/2023 Medical Rec #:  [id number]     Height:       71.0 in Accession #:    [id number]    Weight:       144.8 lb Date of Birth:  01-24-1971      BSA:          1.838 m Patient Age:    52 years      BP:           150/81 mmHg Patient Gender: M             HR:           92 bpm. Exam Location:  Inpatient Procedure: Transesophageal Echo, Color Doppler and Cardiac Doppler Indications:     Bacteremia  History:         Patient has prior history of Echocardiogram examinations, most                  recent 01/10/2023. CHF, CAD, Prior Cardiac Surgery, COPD and                  ESRD, h/o AVR with a 27mm Pericard Mitroflow bioprosthetic and                  subsequent total arch replacement in 2016 at [facility],                  Signs/Symptoms:Bacteremia; Risk Factors:Current Smoker,                  Dyslipidemia, Hypertension and ETOH Abuse.                  Aortic Valve: 27 mm Mitroflow pericardial valve is present in                  the aortic position.  01/14/23 0000  Home infusion instructions       Comments: To be given at the patient's hemodialysis center  Question:  Instructions  Answer:  Flushing of vascular access device: 0.9% NaCl pre/post medication administration and prn patency; Heparin 100 u/ml, 5ml for implanted ports and Heparin 10u/ml, 5ml for all other central venous catheters.   01/14/23 4403            Follow-up Information     [place] Go on 01/14/2023.   Why: On 10/31, please arrive at 12:20 pm for 12:35 pm chair time (10/31 only).  Regular schedule will be Tuesday, Thursday, Saturday with 6:50 am chair time. Contact information: [address] [place] [address] [phone]         Loura Back, NP.  Schedule an appointment as soon as possible for a visit in 1 week(s).   Specialty: Nurse Practitioner Contact information: [address] [place] [address] [phone]                Allergies  Allergen Reactions   Clonidine Derivatives Nausea And Vomiting and Other (See Comments)    Tablets by mouth = Tolerated  Patches on the skin = Nausea & vomiting after wearing 4-5 days    Imdur [Isosorbide Nitrate] Other (See Comments)    Headaches -- patient instructed to continue taking    Tramadol Other (See Comments)    Headaches     Consultations: Nephrology, ID, cardiology, hematology   Procedures/Studies: ECHO TEE  Result Date: 01/13/2023    TRANSESOPHOGEAL ECHO REPORT   Patient Name:   [name] Date of Exam: 01/13/2023 Medical Rec #:  [id number]     Height:       71.0 in Accession #:    [id number]    Weight:       144.8 lb Date of Birth:  01-24-1971      BSA:          1.838 m Patient Age:    52 years      BP:           150/81 mmHg Patient Gender: M             HR:           92 bpm. Exam Location:  Inpatient Procedure: Transesophageal Echo, Color Doppler and Cardiac Doppler Indications:     Bacteremia  History:         Patient has prior history of Echocardiogram examinations, most                  recent 01/10/2023. CHF, CAD, Prior Cardiac Surgery, COPD and                  ESRD, h/o AVR with a 27mm Pericard Mitroflow bioprosthetic and                  subsequent total arch replacement in 2016 at [facility],                  Signs/Symptoms:Bacteremia; Risk Factors:Current Smoker,                  Dyslipidemia, Hypertension and ETOH Abuse.                  Aortic Valve: 27 mm Mitroflow pericardial valve is present in                  the aortic position.  01/14/23 0000  Home infusion instructions       Comments: To be given at the patient's hemodialysis center  Question:  Instructions  Answer:  Flushing of vascular access device: 0.9% NaCl pre/post medication administration and prn patency; Heparin 100 u/ml, 5ml for implanted ports and Heparin 10u/ml, 5ml for all other central venous catheters.   01/14/23 4403            Follow-up Information     [place] Go on 01/14/2023.   Why: On 10/31, please arrive at 12:20 pm for 12:35 pm chair time (10/31 only).  Regular schedule will be Tuesday, Thursday, Saturday with 6:50 am chair time. Contact information: [address] [place] [address] [phone]         Loura Back, NP.  Schedule an appointment as soon as possible for a visit in 1 week(s).   Specialty: Nurse Practitioner Contact information: [address] [place] [address] [phone]                Allergies  Allergen Reactions   Clonidine Derivatives Nausea And Vomiting and Other (See Comments)    Tablets by mouth = Tolerated  Patches on the skin = Nausea & vomiting after wearing 4-5 days    Imdur [Isosorbide Nitrate] Other (See Comments)    Headaches -- patient instructed to continue taking    Tramadol Other (See Comments)    Headaches     Consultations: Nephrology, ID, cardiology, hematology   Procedures/Studies: ECHO TEE  Result Date: 01/13/2023    TRANSESOPHOGEAL ECHO REPORT   Patient Name:   [name] Date of Exam: 01/13/2023 Medical Rec #:  [id number]     Height:       71.0 in Accession #:    [id number]    Weight:       144.8 lb Date of Birth:  01-24-1971      BSA:          1.838 m Patient Age:    52 years      BP:           150/81 mmHg Patient Gender: M             HR:           92 bpm. Exam Location:  Inpatient Procedure: Transesophageal Echo, Color Doppler and Cardiac Doppler Indications:     Bacteremia  History:         Patient has prior history of Echocardiogram examinations, most                  recent 01/10/2023. CHF, CAD, Prior Cardiac Surgery, COPD and                  ESRD, h/o AVR with a 27mm Pericard Mitroflow bioprosthetic and                  subsequent total arch replacement in 2016 at [facility],                  Signs/Symptoms:Bacteremia; Risk Factors:Current Smoker,                  Dyslipidemia, Hypertension and ETOH Abuse.                  Aortic Valve: 27 mm Mitroflow pericardial valve is present in                  the aortic position.  Physician Discharge Summary  [name]:[id number] DOB: 02/03/1971 DOA: 01/09/2023  PCP: Loura Back, NP  Admit date: 01/09/2023 Discharge date: 01/14/2023  Admitted From: Home Disposition:  Home  Discharge Condition:Stable CODE STATUS:FULL Diet recommendation: Renal  Brief/Interim Summary: Patient is a 52 year old male with history of hypertension, hyperlipidemia, coronary artery disease status post CABG, MIs, CVA, ESRD on hemodialysis once a week, descending thoracic aortic aneurysm, history of aortic valve replacement with bioprosthetic valve, severe left ventricular venous distention, tobacco use who presented to the Emergency Department with complaint of 2-week history of midsternal chest pain.  Blood cultures came out a positive for Staph epidermidis.  Hospital course remarkable for worsening thrombocytopenia.  Echo also showed moderate/severe stenosis of the posterior aortic valve.  Cardiology, ID, nephrology, hematology were consulted and following .  ID signed off recommending 6 weeks of IV antibiotics.  He will get antibiotics during dialysis.  Cardiology will follow him as an outpatient.  Medically stable for discharge home today.  Following problems were addressed during the hospitalization:  Gram-positive bacteremia: Blood cultures showed Staph epidermidis.  Currently on cefazolin.  TTE did not show any valvular vegetation.  ID consulted.  Currently afebrile, hemodynamically stable.  Leukocytosis improving.  ID recommended 6 weeks of IV antibiotics, given during dialysis   History of coronary artery disease status post CABG/PCI/atypical chest pain :Cardiology following.  Troponin elevated with flat trend.  Currently on aspirin.  He will follow-up with cardiology as an outpatient.  Continue nitrates as needed   Aortic stenosis: TEE showed severely dilated left atrium, moderate aortic stenosis.  Cardiology will follow-up as an outpatient   Thrombocytopenia: Chronic, baseline  around 29528.  Worsening secondary to sepsis, bacteremia.  Hematology following.  No need for additional workup.  Platelets level improving.   ESRD on dialysis: Gets dialysis once a week.  Still makes urine.  Nephrology following.  Now will be on 3 times dialysis a week.   Hypertension: On amlodipine, hydralazine.  Monitor blood pressure at home   GERD: Continue PPI   Acute bronchitis: Currently on prednisone taper.   Currently on room air.   Discharge Diagnoses:  Principal Problem:   Acute febrile illness Active Problems:   Coronary artery disease involving native coronary artery of native heart with angina pectoris (HCC)   Tobacco use   Aortic valve disease   Thrombocytopenia (HCC)   GERD without esophagitis   Essential hypertension   Mixed hyperlipidemia   Chest pain   COPD (chronic obstructive pulmonary disease) (HCC)   End-stage renal disease on hemodialysis (HCC)   SIRS (systemic inflammatory response syndrome) (HCC)   Acute bronchitis   Chronic heart failure with preserved ejection fraction (HFpEF) (HCC)   Elevated troponin   Weight loss   Protein-calorie malnutrition, severe    Discharge Instructions  Discharge Instructions     Diet - low sodium heart healthy   Complete by: As directed    Renal diet   Discharge instructions   Complete by: As directed    1)Please take prescribed medications as instructed 2)Please continue dialysis 3 times a week, get antibiotics during dialysis 3)You will be called by cardiology and infectious disease for follow-up appointment   Home infusion instructions   Complete by: As directed    To be given at the patient's hemodialysis center   Instructions: Flushing of vascular access device: 0.9% NaCl pre/post medication administration and prn patency; Heparin 100 u/ml, 5ml for implanted ports and Heparin 10u/ml, 5ml for all other central venous  Physician Discharge Summary  [name]:[id number] DOB: 02/03/1971 DOA: 01/09/2023  PCP: Loura Back, NP  Admit date: 01/09/2023 Discharge date: 01/14/2023  Admitted From: Home Disposition:  Home  Discharge Condition:Stable CODE STATUS:FULL Diet recommendation: Renal  Brief/Interim Summary: Patient is a 52 year old male with history of hypertension, hyperlipidemia, coronary artery disease status post CABG, MIs, CVA, ESRD on hemodialysis once a week, descending thoracic aortic aneurysm, history of aortic valve replacement with bioprosthetic valve, severe left ventricular venous distention, tobacco use who presented to the Emergency Department with complaint of 2-week history of midsternal chest pain.  Blood cultures came out a positive for Staph epidermidis.  Hospital course remarkable for worsening thrombocytopenia.  Echo also showed moderate/severe stenosis of the posterior aortic valve.  Cardiology, ID, nephrology, hematology were consulted and following .  ID signed off recommending 6 weeks of IV antibiotics.  He will get antibiotics during dialysis.  Cardiology will follow him as an outpatient.  Medically stable for discharge home today.  Following problems were addressed during the hospitalization:  Gram-positive bacteremia: Blood cultures showed Staph epidermidis.  Currently on cefazolin.  TTE did not show any valvular vegetation.  ID consulted.  Currently afebrile, hemodynamically stable.  Leukocytosis improving.  ID recommended 6 weeks of IV antibiotics, given during dialysis   History of coronary artery disease status post CABG/PCI/atypical chest pain :Cardiology following.  Troponin elevated with flat trend.  Currently on aspirin.  He will follow-up with cardiology as an outpatient.  Continue nitrates as needed   Aortic stenosis: TEE showed severely dilated left atrium, moderate aortic stenosis.  Cardiology will follow-up as an outpatient   Thrombocytopenia: Chronic, baseline  around 29528.  Worsening secondary to sepsis, bacteremia.  Hematology following.  No need for additional workup.  Platelets level improving.   ESRD on dialysis: Gets dialysis once a week.  Still makes urine.  Nephrology following.  Now will be on 3 times dialysis a week.   Hypertension: On amlodipine, hydralazine.  Monitor blood pressure at home   GERD: Continue PPI   Acute bronchitis: Currently on prednisone taper.   Currently on room air.   Discharge Diagnoses:  Principal Problem:   Acute febrile illness Active Problems:   Coronary artery disease involving native coronary artery of native heart with angina pectoris (HCC)   Tobacco use   Aortic valve disease   Thrombocytopenia (HCC)   GERD without esophagitis   Essential hypertension   Mixed hyperlipidemia   Chest pain   COPD (chronic obstructive pulmonary disease) (HCC)   End-stage renal disease on hemodialysis (HCC)   SIRS (systemic inflammatory response syndrome) (HCC)   Acute bronchitis   Chronic heart failure with preserved ejection fraction (HFpEF) (HCC)   Elevated troponin   Weight loss   Protein-calorie malnutrition, severe    Discharge Instructions  Discharge Instructions     Diet - low sodium heart healthy   Complete by: As directed    Renal diet   Discharge instructions   Complete by: As directed    1)Please take prescribed medications as instructed 2)Please continue dialysis 3 times a week, get antibiotics during dialysis 3)You will be called by cardiology and infectious disease for follow-up appointment   Home infusion instructions   Complete by: As directed    To be given at the patient's hemodialysis center   Instructions: Flushing of vascular access device: 0.9% NaCl pre/post medication administration and prn patency; Heparin 100 u/ml, 5ml for implanted ports and Heparin 10u/ml, 5ml for all other central venous  01/14/23 0000  Home infusion instructions       Comments: To be given at the patient's hemodialysis center  Question:  Instructions  Answer:  Flushing of vascular access device: 0.9% NaCl pre/post medication administration and prn patency; Heparin 100 u/ml, 5ml for implanted ports and Heparin 10u/ml, 5ml for all other central venous catheters.   01/14/23 4403            Follow-up Information     [place], [facility] Go on 01/14/2023.   Why: On 10/31, please arrive at 12:20 pm for 12:35 pm chair time (10/31 only).  Regular schedule will be Tuesday, Thursday, Saturday with 6:50 am chair time. Contact information: [address] [place] [address] [phone]         Loura Back, NP.  Schedule an appointment as soon as possible for a visit in 1 week(s).   Specialty: Nurse Practitioner Contact information: [address] [place] [address] [phone]                Allergies  Allergen Reactions   Clonidine Derivatives Nausea And Vomiting and Other (See Comments)    Tablets by mouth = Tolerated  Patches on the skin = Nausea & vomiting after wearing 4-5 days    Imdur [Isosorbide Nitrate] Other (See Comments)    Headaches -- patient instructed to continue taking    Tramadol Other (See Comments)    Headaches     Consultations: Nephrology, ID, cardiology, hematology   Procedures/Studies: ECHO TEE  Result Date: 01/13/2023    TRANSESOPHOGEAL ECHO REPORT   Patient Name:   [name] Date of Exam: 01/13/2023 Medical Rec #:  [id number]     Height:       71.0 in Accession #:    [id number]    Weight:       144.8 lb Date of Birth:  01-24-1971      BSA:          1.838 m Patient Age:    52 years      BP:           150/81 mmHg Patient Gender: M             HR:           92 bpm. Exam Location:  Inpatient Procedure: Transesophageal Echo, Color Doppler and Cardiac Doppler Indications:     Bacteremia  History:         Patient has prior history of Echocardiogram examinations, most                  recent 01/10/2023. CHF, CAD, Prior Cardiac Surgery, COPD and                  ESRD, h/o AVR with a 27mm Pericard Mitroflow bioprosthetic and                  subsequent total arch replacement in 2016 at [facility],                  Signs/Symptoms:Bacteremia; Risk Factors:Current Smoker,                  Dyslipidemia, Hypertension and ETOH Abuse.                  Aortic Valve: 27 mm Mitroflow pericardial valve is present in                  the aortic position.  Date/Time: 01/13/2023/11:14:53 AM    Final    EP  STUDY  Result Date: 01/13/2023 See surgical note for result.  MR LUMBAR SPINE WO CONTRAST  Result Date: 01/11/2023 CLINICAL DATA:  infection EXAM: MRI LUMBAR SPINE WITHOUT CONTRAST TECHNIQUE: Multiplanar, multisequence MR imaging of the lumbar spine was performed. No intravenous contrast was administered. COMPARISON:  None Available. FINDINGS: Segmentation:  Standard. Alignment:  Physiologic. Vertebrae: No abnormal bone marrow edema to suggest discitis/osteomyelitis. Diffusely low STIR and T2 signal is likely related to patient's iron injection. Conus medullaris and cauda equina: Conus extends to the T12 level. Conus and cauda equina appear normal. Paraspinal and other soft tissues: No appreciable paraspinal edema. Known infrarenal abdominal aortic aneurysm better characterized on recent CT of the chest/abdomen/pelvis. Disc levels: T12-L1: No significant disc protrusion, foraminal stenosis, or canal stenosis. L1-L2: No significant disc protrusion, foraminal stenosis, or canal stenosis. L2-L3: No significant disc protrusion, foraminal stenosis, or canal stenosis. L3-L4: Mild disc height loss and desiccation. No significant disc protrusion, foraminal stenosis, or canal stenosis. L4-L5: Mild disc height loss and desiccation. Small disc bulge and annular fissure. No significant disc protrusion, foraminal stenosis, or canal stenosis. L5-S1: Mild disc height loss and desiccation. No significant disc protrusion, foraminal stenosis, or canal stenosis. IMPRESSION: 1. No obvious evidence of discitis/osteomyelitis. Postcontrast imaging could provide more sensitive evaluation if clinically warranted. 2. Mild lower lumbar degenerative change without significant stenosis. 3. Abnormal bone marrow signal diffusely is likely related to the patient's iron infusions. 4. Known infrarenal abdominal aortic aneurysm better characterized on recent CT of the chest/abdomen/pelvis. Electronically Signed   By: Feliberto Harts M.D.    On: 01/11/2023 17:47   ECHOCARDIOGRAM COMPLETE  Result Date: 01/10/2023    ECHOCARDIOGRAM REPORT   Patient Name:   [name] Date of Exam: 01/10/2023 Medical Rec #:  [id number]     Height:       71.0 in Accession #:    [id number]    Weight:       132.0 lb Date of Birth:  July 05, 1970      BSA:          1.767 m Patient Age:    52 years      BP:           143/78 mmHg Patient Gender: M             HR:           86 bpm. Exam Location:  Inpatient Procedure: 2D Echo, Cardiac Doppler and Color Doppler Indications:     CHF-Acute Diastolic I50.31  History:         Patient has prior history of Echocardiogram examinations, most                  recent 04/15/2022. Cardiomyopathy, CAD and Previous Myocardial                  Infarction, ETOH abuse, Stroke and COPD, Aortic Valve Disease                  and Mitral Valve Disease; Risk Factors:Current Smoker,                  Hypertension and Dyslipidemia. Aortic dissection repair.  Sonographer:     Dondra Prader RVT RCS Referring Phys:  [id number] [facility] ADEFESO Diagnosing Phys: Lennie Odor MD IMPRESSIONS  1. Left ventricular ejection fraction, by estimation, is 55 to 60%. The left ventricle has normal function. The left ventricle

## 2023-01-14 NOTE — Discharge Planning (Signed)
[facility] Patient Discharge Orders- [facility] CLINIC: [facility]  Patient's name: [name] Admit/DC Dates: 01/09/2023 - 01/14/23  Discharge Diagnoses: MSSE Bacteremia with prosthetic valve, see below for ABX details  Chest pain: troponins elevated but stable, TEE done: no evidence of infective endocarditis but moderate prosthetic stenosis Thrombocytopenia: per heme's note from 10/30: high IPF suggesting Bone marrow recovery. Recovering well. No need of additional tests or work up at this time  Aranesp: Given: No    Last Hgb: 10.3 PRBC's Given: No  ESA dose for discharge: mircera 30 mcg IV q 2 weeks  IV Iron dose at discharge: Hold weekly iron while patient is receiving ABXs. Can resume iron once ABXs are completed!  Heparin change: Yes. Give 3000 units with HD 3x/weekly while he's there getting his ABXs.  EDW Change: No. Not convinced weights here were accurate. Please notify renal team if he comes under EDW today and we will make appropriate adjustments then.  Bath Change: No  Access intervention/Change: No  Calcitriol change: No  Discharge Labs: Calcium 10.3 Phosphorus 5.5 Albumin 2.9 K+ 4.4  IV Antibiotics: Yes  Details: MSSE bacteremia with prosthetic valve, TTE noted severe AV stenosis-->TEE no veg, moderate thickened AV,  MRI lumbar spine did not show infection   Cefazolin 2GM IV with HD for 6 weeks. End date of 02/21/23 (reviewed last ID note from Dr. Thedore Mins on 01/13/23).  On Coumadin?: No   OTHER/APPTS/LAB ORDERS:  Please note: Patient will be on HD 3x/weekly (TTS) while he's receiving his ABXs. I made the clinical manager and Dr. Valentino Nose aware of this change!  For renal provider: follow repeat blood cx to ensure clearance. Pt is scheduled for f/u with ID on 02/16/23 to see Dr. Thedore Mins    D/C Meds to be reconciled by nurse after every discharge.  Completed By: Salome Holmes, NP   Reviewed by: MD:______ RN_______

## 2023-01-14 NOTE — Plan of Care (Signed)

## 2023-01-14 NOTE — Progress Notes (Signed)
Plan is for pt to d/c this morning. Spoke to pt via phone to be advised that his HD clinic is aware that he will need HD 3x's a week at d/c and will need iv abx as well. Pt advised that his normal schedule will be TTS 6:50 am chair time. Pt agreeable. Also discussed with pt the plan for d/c this morning if possible and pt to receive out-pt HD at [facility] if pt has transportation. Pt states he has transportation from hospital to clinic and advised that he will need to arrive today by 12:20 for 12:35 pm chair time. Pt voices understanding and agreeable. Contacted clinic to be advised that pt will d/c this morning with plans to come to clinic today for treatment. Renal NP has sent orders to clinic for treatment today. Arrangements added to pt's AVS as well.   Olivia Canter Renal Navigator [phone]

## 2023-01-16 ENCOUNTER — Emergency Department (HOSPITAL_COMMUNITY): Payer: 59

## 2023-01-16 ENCOUNTER — Other Ambulatory Visit: Payer: Self-pay

## 2023-01-16 ENCOUNTER — Encounter (HOSPITAL_COMMUNITY): Payer: Self-pay

## 2023-01-16 ENCOUNTER — Emergency Department (HOSPITAL_COMMUNITY)
Admission: EM | Admit: 2023-01-16 | Discharge: 2023-01-16 | Disposition: A | Discharge status: Home / Self Care | Payer: 59 | Source: Home / Self Care | Attending: Emergency Medicine | Admitting: Emergency Medicine

## 2023-01-16 DIAGNOSIS — R079 Chest pain, unspecified: Secondary | ICD-10-CM

## 2023-01-16 DIAGNOSIS — Z7982 Long term (current) use of aspirin: Secondary | ICD-10-CM | POA: Insufficient documentation

## 2023-01-16 DIAGNOSIS — Z951 Presence of aortocoronary bypass graft: Secondary | ICD-10-CM | POA: Insufficient documentation

## 2023-01-16 DIAGNOSIS — Z992 Dependence on renal dialysis: Secondary | ICD-10-CM | POA: Insufficient documentation

## 2023-01-16 DIAGNOSIS — I214 Non-ST elevation (NSTEMI) myocardial infarction: Secondary | ICD-10-CM | POA: Diagnosis not present

## 2023-01-16 DIAGNOSIS — I2510 Atherosclerotic heart disease of native coronary artery without angina pectoris: Secondary | ICD-10-CM | POA: Insufficient documentation

## 2023-01-16 DIAGNOSIS — N186 End stage renal disease: Secondary | ICD-10-CM | POA: Insufficient documentation

## 2023-01-16 DIAGNOSIS — R0789 Other chest pain: Secondary | ICD-10-CM | POA: Diagnosis not present

## 2023-01-16 LAB — CULTURE, BLOOD (ROUTINE X 2)
Culture: NO GROWTH
Culture: NO GROWTH
Special Requests: ADEQUATE
Special Requests: ADEQUATE

## 2023-01-16 LAB — CBC
HCT: 31.3 % — ABNORMAL LOW (ref 39.0–52.0)
Hemoglobin: 10.2 g/dL — ABNORMAL LOW (ref 13.0–17.0)
MCH: 31.2 pg (ref 26.0–34.0)
MCHC: 32.6 g/dL (ref 30.0–36.0)
MCV: 95.7 fL (ref 80.0–100.0)
Platelets: 264 10*3/uL (ref 150–400)
RBC: 3.27 MIL/uL — ABNORMAL LOW (ref 4.22–5.81)
RDW: 14.9 % (ref 11.5–15.5)
WBC: 17.7 10*3/uL — ABNORMAL HIGH (ref 4.0–10.5)
nRBC: 0 % (ref 0.0–0.2)

## 2023-01-16 LAB — BASIC METABOLIC PANEL
Anion gap: 13 (ref 5–15)
BUN: 87 mg/dL — ABNORMAL HIGH (ref 6–20)
CO2: 29 mmol/L (ref 22–32)
Calcium: 9.8 mg/dL (ref 8.9–10.3)
Chloride: 96 mmol/L — ABNORMAL LOW (ref 98–111)
Creatinine, Ser: 7.45 mg/dL — ABNORMAL HIGH (ref 0.61–1.24)
GFR, Estimated: 8 mL/min — ABNORMAL LOW (ref >60)
Glucose, Bld: 117 mg/dL — ABNORMAL HIGH (ref 70–99)
Potassium: 3.9 mmol/L (ref 3.5–5.1)
Sodium: 138 mmol/L (ref 135–145)

## 2023-01-16 LAB — TROPONIN I (HIGH SENSITIVITY)
Troponin I (High Sensitivity): 139 ng/L (ref <18)
Troponin I (High Sensitivity): 148 ng/L (ref <18)

## 2023-01-16 MED ORDER — FENTANYL CITRATE PF 50 MCG/ML IJ SOSY
50.0000 ug | PREFILLED_SYRINGE | INTRAMUSCULAR | Status: DC | PRN
  Administered 2023-01-16: 50 ug via INTRAVENOUS
  Filled 2023-01-16 (×2): qty 1

## 2023-01-16 MED ORDER — SODIUM CHLORIDE (PF) 0.9 % IJ SOLN
INTRAMUSCULAR | Status: AC
  Filled 2023-01-16: qty 50

## 2023-01-16 MED ORDER — IOHEXOL 350 MG/ML SOLN
100.0000 mL | Freq: Once | INTRAVENOUS | Status: AC | PRN
  Administered 2023-01-16: 100 mL via INTRAVENOUS

## 2023-01-16 MED ORDER — FENTANYL CITRATE PF 50 MCG/ML IJ SOSY
50.0000 ug | PREFILLED_SYRINGE | Freq: Once | INTRAMUSCULAR | Status: AC
  Administered 2023-01-16: 50 ug via INTRAVENOUS
  Filled 2023-01-16: qty 1

## 2023-01-16 MED ORDER — OXYCODONE HCL 5 MG PO TABS
5.0000 mg | ORAL_TABLET | Freq: Once | ORAL | Status: AC
  Administered 2023-01-16: 5 mg via ORAL
  Filled 2023-01-16: qty 1

## 2023-01-16 MED ORDER — KETOROLAC TROMETHAMINE 15 MG/ML IJ SOLN
10.0000 mg | Freq: Once | INTRAMUSCULAR | Status: AC
  Administered 2023-01-16: 10 mg via INTRAVENOUS
  Filled 2023-01-16: qty 1

## 2023-01-16 NOTE — ED Notes (Signed)
Pt requesting pain med for chest pain that radiates to back 10/10

## 2023-01-16 NOTE — ED Triage Notes (Signed)
Patient arrives with complaints of chest pain that radiates to his back and shortness of breath that started around 530am today. Patient states he was waking up to go to dialysis and the pain was there. Patient receives dialysis on T, TH, SAT. Patient was recently admitted and discharged from the ED for infection in his blood stream. Patient denies blood thinners. Patient took 2 nitros at home with no relief.

## 2023-01-16 NOTE — ED Provider Notes (Signed)
[place] EMERGENCY DEPARTMENT AT [facility] Provider Note   CSN: [id number] Arrival date & time: 01/16/23  4098     History  Chief Complaint  Patient presents with   Chest Pain    [name] is a 52 y.o. male.  HPI    52 year old male comes in with chief complaint of chest pain. Patient has history of CAD status post CABG, stroke, ESRD on hemodialysis, descending thoracic aortic aneurysm status post repair and aortic valve replacement with prosthetic valve and a recent admission to the hospital for bacteremia.  Patient is currently on 6 weeks of antibiotics coordinated with his dialysis for bacteremia.  Patient is being aggressively treated especially in the setting of him having prosthetic valve.  His TEE did not reveal any evidence of vegetation but there were some valvular changes which will need to be addressed once he is cleared off of his infection.  Patient states that he continues to have the left-sided chest pain that has been present even when he was in the hospital.  Pain was severe last night.  Pain is radiating from the left side of his chest to his back.  The pain is similar to his dissection pain.  He went for dialysis today.  They gave him nitroglycerin which did not help.  Patient was sent to the ER for further assessment.  Home Medications Prior to Admission medications   Medication Sig Start Date End Date Taking? Authorizing Provider  acetaminophen (TYLENOL) 325 MG tablet Take 2 tablets (650 mg total) by mouth every 4 (four) hours as needed. 01/14/23 01/14/24  Burnadette Pop, MD  albuterol (VENTOLIN HFA) 108 (90 Base) MCG/ACT inhaler Inhale 1 puff into the lungs every 6 (six) hours as needed for wheezing or shortness of breath. 05/24/21   [provider]  amLODipine (NORVASC) 10 MG tablet TAKE 1 TABLET(10 MG) BY MOUTH DAILY Patient taking differently: Take 10 mg by mouth daily. 07/15/21   Laurey Morale, MD  aspirin EC 81 MG EC tablet Take  1 tablet (81 mg total) by mouth daily. 12/12/17   Marcelino Duster, PA  ceFAZolin (ANCEF) IVPB Inject 2 g into the vein Every Tuesday,Thursday,and Saturday with dialysis. Indication:  MSSE bacteremia Last Day of Therapy:  02/22/23 Labs - Once weekly:  CBC/D and BMP, Labs - Once weekly: ESR and CRP Method of administration: Per hemodialysis protocol 01/14/23 02/22/23  Burnadette Pop, MD  Evolocumab (REPATHA SURECLICK) 140 MG/ML SOAJ Inject 140 mg into the skin every 14 (fourteen) days. 10/28/22   Hilty, Lisette Abu, MD  hydrALAZINE (APRESOLINE) 100 MG tablet Take 0.5 tablets (50 mg total) by mouth 3 (three) times daily. 01/14/23   Burnadette Pop, MD  HYDROcodone-acetaminophen (NORCO/VICODIN) 5-325 MG tablet Take 1 tablet by mouth every 6 (six) hours as needed for moderate pain. 12/21/22   Vanetta Mulders, MD  lidocaine-prilocaine (EMLA) cream Apply 1 application. topically as needed (prior to port being accessed). Use on dialysis days (Tues, Thurs and Sat)    [provider]  meclizine (ANTIVERT) 25 MG tablet Take 1 tablet (25 mg total) by mouth 3 (three) times daily as needed for dizziness. 12/21/22   Vanetta Mulders, MD  nitroGLYCERIN (NITROSTAT) 0.4 MG SL tablet Place 1 tablet (0.4 mg total) under the tongue every 5 (five) minutes as needed for chest pain. 01/14/23   Burnadette Pop, MD  oxyCODONE-acetaminophen (PERCOCET) 5-325 MG tablet Take 1 tablet by mouth every 4 (four) hours as needed for severe pain. Patient  not taking: Reported on 01/09/2023 12/25/22 12/25/23  Leonie Douglas, MD  pantoprazole (PROTONIX) 40 MG tablet Take 40 mg by mouth daily.    [provider]  polyethylene glycol powder (GLYCOLAX/MIRALAX) 17 GM/SCOOP powder Take 17 g by mouth daily. 01/14/23   Burnadette Pop, MD  predniSONE (DELTASONE) 20 MG tablet Take 2 tablets (40 mg total) by mouth daily with breakfast for 3 days. 01/15/23 01/18/23  Burnadette Pop, MD  senna-docusate (SENOKOT-S) 8.6-50 MG tablet  Take 2 tablets by mouth 2 (two) times daily. 01/14/23   Burnadette Pop, MD  STIOLTO RESPIMAT 2.5-2.5 MCG/ACT AERS Inhale 2 puffs into the lungs at bedtime. 12/29/22   [provider]      Allergies    Clonidine derivatives, Imdur [isosorbide nitrate], and Tramadol    Review of Systems   Review of Systems  All other systems reviewed and are negative.   Physical Exam Updated Vital Signs BP (!) 178/95   Pulse 87   Temp 98.2 F (36.8 C) (Oral)   Resp 14   SpO2 98%  Physical Exam Vitals and nursing note reviewed.  Constitutional:      Appearance: He is well-developed.  HENT:     Head: Atraumatic.  Cardiovascular:     Rate and Rhythm: Normal rate.     Pulses:          Radial pulses are 2+ on the right side.  Pulmonary:     Effort: Pulmonary effort is normal.  Musculoskeletal:     Cervical back: Neck supple.     Right lower leg: No edema.     Left lower leg: No edema.  Skin:    General: Skin is warm.  Neurological:     Mental Status: He is alert and oriented to person, place, and time.     ED Results / Procedures / Treatments   Labs (all labs ordered are listed, but only abnormal results are displayed) Labs Reviewed  BASIC METABOLIC PANEL - Abnormal; Notable for the following components:      Result Value   Chloride 96 (*)    Glucose, Bld 117 (*)    BUN 87 (*)    Creatinine, Ser 7.45 (*)    GFR, Estimated 8 (*)    All other components within normal limits  CBC - Abnormal; Notable for the following components:   WBC 17.7 (*)    RBC 3.27 (*)    Hemoglobin 10.2 (*)    HCT 31.3 (*)    All other components within normal limits  TROPONIN I (HIGH SENSITIVITY) - Abnormal; Notable for the following components:   Troponin I (High Sensitivity) 139 (*)    All other components within normal limits  TROPONIN I (HIGH SENSITIVITY) - Abnormal; Notable for the following components:   Troponin I (High Sensitivity) 148 (*)    All other components within normal limits     EKG EKG Interpretation Date/Time:  Saturday January 16 2023 06:35:26 EDT Ventricular Rate:  93 PR Interval:  165 QRS Duration:  107 QT Interval:  393 QTC Calculation: 489 R Axis:   41  Text Interpretation: Sinus rhythm LAE, consider biatrial enlargement LVH with secondary repolarization abnormality Anterior Q waves, possibly due to LVH No acute changes No significant change since last tracing ST depression more pronounced Confirmed by Derwood Kaplan [phone]) on 01/16/2023 7:49:12 AM  Radiology CT Angio Chest/Abd/Pel for Dissection W and/or Wo Contrast  Result Date: 01/16/2023 CLINICAL DATA:  Acute aortic syndrome. EXAM: CT ANGIOGRAPHY CHEST,  ABDOMEN AND PELVIS TECHNIQUE: Non-contrast CT of the chest was initially obtained. Multidetector CT imaging through the chest, abdomen and pelvis was performed using the standard protocol during bolus administration of intravenous contrast. Multiplanar reconstructed images and MIPs were obtained and reviewed to evaluate the vascular anatomy. RADIATION DOSE REDUCTION: This exam was performed according to the departmental dose-optimization program which includes automated exposure control, adjustment of the mA and/or kV according to patient size and/or use of iterative reconstruction technique. CONTRAST:  OMNIPAQUE IOHEXOL 350 MG/ML SOLN COMPARISON:  01/09/2023.  04/27/2022 FINDINGS: CTA CHEST FINDINGS Cardiovascular: The heart is enlarged. No substantial pericardial effusion. Status post aortic valve replacement. Endograft identified in the thoracic aorta, similar to prior studies and patent. Postoperative change from debranching noted with patency of the branch vessel anatomy. The lumen of the left brachiocephalic vein stent opacifies consistent with patency. Enlargement of the pulmonary outflow tract/main pulmonary arteries suggests pulmonary arterial hypertension. No large central pulmonary embolus. Mediastinum/Nodes: No mediastinal lymphadenopathy.  There is no hilar lymphadenopathy. The esophagus has normal imaging features. There is no axillary lymphadenopathy. Lungs/Pleura: Subtle changes of centrilobular and paraseptal emphysema noted. Dependent atelectasis. No pleural effusion. Musculoskeletal: No worrisome lytic or sclerotic osseous abnormality. Review of the MIP images confirms the above findings. CTA ABDOMEN AND PELVIS FINDINGS VASCULAR Aorta: Postsurgical changes noted in the abdominal aorta, distal to the stent graft. Aneurysmal dilatation of the distal abdominal aorta measured previously at 3.2 cm is stable at 3.2 cm today. Fluid collection adjacent to the operative site described previously is stable, measuring 3.7 x 3.4 cm today compared to 3.7 x 3.5 cm previously. Celiac axis, and SMA are widely patent. Origin of the left renal artery not well demonstrated and similar to prior although left renal artery does opacify. Right renal artery appears widely patent. Celiac: Postsurgical change. As before, origin appears somewhat patulous without evidence for flow limiting stenosis. SMA: Surgical changes similar to prior with patulous origin. No features to suggest flow limiting stenosis. Renals: Stable.  Both renal arteries opacified, similar to prior. IMA: IMA does opacify. Inflow: Interval small dilatation of both common iliac arteries, similar to prior. Right common iliac artery measures 2.0 cm diameter compared to 2.2 cm previously. 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 Atherosclerotic disease characterized as both external iliac arteries, patent bilaterally. Internal iliac arteries are patent bilaterally. Veins: No obvious venous abnormality within the limitations of this arterial phase study. Review of the MIP images confirms the above findings. NON-VASCULAR Hepatobiliary: Hypodensity in the right liver is too small to characterize but statistically most likely benign. Layering tiny calcified  gallstones evident (167/24). No intrahepatic or extrahepatic biliary dilation. Pancreas: No focal mass lesion. No dilatation of the main duct. No intraparenchymal cyst. No peripancreatic edema. Spleen: No splenomegaly. No suspicious focal mass lesion. Adrenals/Urinary Tract: No adrenal nodule or mass. No hydronephrosis or overtly suspicious renal mass. No hydroureter. The urinary bladder appears normal for the degree of distention. Stomach/Bowel: Stomach is unremarkable. No gastric wall thickening. No evidence of outlet obstruction. Duodenum is normally positioned as is the ligament of Treitz. No small bowel wall thickening. No small bowel dilatation. Moderate to large stool volume. Lymphatic: No discernible lymphadenopathy in the abdomen or pelvis. Reproductive: Prostate gland is enlarged. Other: None. Musculoskeletal: No worrisome lytic or sclerotic osseous abnormality. Review of the MIP images confirms the above findings. IMPRESSION: 1. Stable exam. No new or acute findings in the chest, abdomen, or pelvis. 2.  Stable postsurgical changes in the thoracic and abdominal aorta. Stable aneurysmal dilatation of the distal abdominal aorta measuring 3.2 cm diameter. Stable fluid collection adjacent to the operative site in the abdominal aorta. 3. 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 4. Enlargement of the pulmonary outflow tract/main pulmonary arteries suggests pulmonary arterial hypertension. 5. Cholelithiasis. 6. Prostatomegaly. 7. Aortic Atherosclerosis (ICD10-I70.0) and Emphysema (ICD10-J43.9). Electronically Signed   By: Kennith Center M.D.   On: 01/16/2023 10:11   DG Chest 2 View  Result Date: 01/16/2023 CLINICAL DATA:  Left-sided chest pain. EXAM: CHEST - 2 VIEW COMPARISON:  01/09/2023 FINDINGS: The lungs are clear without focal pneumonia, edema, pneumothorax or pleural effusion. The cardiopericardial silhouette is  within normal limits for size. Thoracic aortic stent graft again noted. No acute bony abnormality. Telemetry leads overlie the chest. IMPRESSION: No active cardiopulmonary disease. Electronically Signed   By: Kennith Center M.D.   On: 01/16/2023 07:17    Procedures Procedures    Medications Ordered in ED Medications  fentaNYL (SUBLIMAZE) injection 50 mcg (50 mcg Intravenous Given 01/16/23 0826)  iohexol (OMNIPAQUE) 350 MG/ML injection 100 mL (100 mLs Intravenous Contrast Given 01/16/23 0837)  oxyCODONE (Oxy IR/ROXICODONE) immediate release tablet 5 mg (5 mg Oral Given 01/16/23 1036)  ketorolac (TORADOL) 15 MG/ML injection 10 mg (10 mg Intravenous Given 01/16/23 1059)  fentaNYL (SUBLIMAZE) injection 50 mcg (50 mcg Intravenous Given 01/16/23 1059)    ED Course/ Medical Decision Making/ A&P                                 Medical Decision Making Amount and/or Complexity of Data Reviewed Labs: ordered. Radiology: ordered.  Risk Prescription drug management.   This patient presents to the ED with chief complaint(s) of chest pain, back pain with pertinent past medical history of CAD status post CABG, thoracic artery aneurysm s/p repair, prosthetic valve, recent admission for bacteremia.patient states that the pain is severe, similar to his dissection/aneurysm pain..  The complaint involves an extensive differential diagnosis and also carries with it a high risk of complications and morbidity.    The differential diagnosis includes :  Aortic dissection, acute coronary syndrome, pneumothorax, pneumonia, chest wall pain.  Doubt endocarditis is the cause of the pain, but that is a possibility as well.  Patient does not have any evidence of septic arthritis on my assessment.  He had MRI of the lumbar spine last time which was negative.  He denies any midline thoracic pain, neck pain I do not think this is discitis at this time.  The initial plan is to get basic labs, CT dissection.  He had elevated  troponins last time he was here.  We will repeat troponin this time as well.   Additional history obtained: Records reviewed previous admission documents and cardiology notes.  Independent labs interpretation:  The following labs were independently interpreted: High sensitive troponins are 139 and 148 in the ED.  Troponins are considerably lower than 400 level range troponin he had last time. No profound anemia.  White count is elevated at 17.7, but patient has bacteremia and is on to biotics.  Independent visualization and interpretation of imaging: - I independently visualized the following imaging with scope of interpretation limited to determining acute life threatening conditions related to emergency care: CT dissection, which revealed no evidence of dissection.  Treatment  and Reassessment: Results of the ED workup discussed with the patient.  He is slightly frustrated that we cannot figure out the cause of his pain.  I went over the workup in the ER and also the workup that was done while he was admitted.  I acknowledged his existing pain and informed him that although emergent issues were ruled out with dissection and the lab workup here, he could still be having an evolving situation that has not made it clear or nonemergent cause that still could be painful.  I discussed with him that the best neck step in the setting of him having bacteremia is to make sure he finishes the antibiotics and to follow-up with his PCP to see if he needs additional pain management.  We discussed return precautions.  I informed him that dissection and MI effectively have been ruled out, but if he starts having any new symptoms such as one-sided weakness, numbness, headache, fevers, chills, sweats, focal neck or back pain then he needs to return to the ER.  Patient understanding.  He is still requesting something for pain.  I will give him another round of IV fentanyl and IV Toradol 10 mg.  Patient has been on  dialysis for several years.  We will give him 1 round of Toradol only.  Final Clinical Impression(s) / ED Diagnoses Final diagnoses:  Chest pain, unspecified type  ESRD (end stage renal disease) on dialysis [facility])    Rx / DC Orders ED Discharge Orders     None         Derwood Kaplan, MD 01/16/23 1116

## 2023-01-16 NOTE — Discharge Instructions (Signed)
You are seen in the emergency room for chest pain.  The workup in the emergency room reveals no evidence of emergent condition such as aneurysmal bleed, dissection, collapsed lung, infection of the lung or heart attack.  It is unclear what is causing your pain.   We recommend that he follow-up with your primary care doctor.  Continue to follow-up with cardiology as planned for your valve issues.

## 2023-01-17 ENCOUNTER — Other Ambulatory Visit: Payer: Self-pay

## 2023-01-17 ENCOUNTER — Inpatient Hospital Stay (HOSPITAL_COMMUNITY)
Admission: EM | Admit: 2023-01-17 | Discharge: 2023-01-23 | DRG: 280 | Disposition: A | Discharge status: Home / Self Care | Payer: 59 | Attending: Internal Medicine | Admitting: Internal Medicine

## 2023-01-17 ENCOUNTER — Emergency Department (HOSPITAL_COMMUNITY): Payer: 59

## 2023-01-17 ENCOUNTER — Encounter (HOSPITAL_COMMUNITY): Payer: Self-pay

## 2023-01-17 DIAGNOSIS — I2510 Atherosclerotic heart disease of native coronary artery without angina pectoris: Secondary | ICD-10-CM | POA: Diagnosis not present

## 2023-01-17 DIAGNOSIS — R509 Fever, unspecified: Secondary | ICD-10-CM | POA: Diagnosis not present

## 2023-01-17 DIAGNOSIS — Z951 Presence of aortocoronary bypass graft: Secondary | ICD-10-CM

## 2023-01-17 DIAGNOSIS — I252 Old myocardial infarction: Secondary | ICD-10-CM

## 2023-01-17 DIAGNOSIS — I2721 Secondary pulmonary arterial hypertension: Secondary | ICD-10-CM | POA: Diagnosis present

## 2023-01-17 DIAGNOSIS — J449 Chronic obstructive pulmonary disease, unspecified: Secondary | ICD-10-CM | POA: Diagnosis present

## 2023-01-17 DIAGNOSIS — A419 Sepsis, unspecified organism: Principal | ICD-10-CM

## 2023-01-17 DIAGNOSIS — R7989 Other specified abnormal findings of blood chemistry: Secondary | ICD-10-CM | POA: Diagnosis not present

## 2023-01-17 DIAGNOSIS — I132 Hypertensive heart and chronic kidney disease with heart failure and with stage 5 chronic kidney disease, or end stage renal disease: Secondary | ICD-10-CM | POA: Diagnosis present

## 2023-01-17 DIAGNOSIS — J329 Chronic sinusitis, unspecified: Secondary | ICD-10-CM | POA: Diagnosis present

## 2023-01-17 DIAGNOSIS — Z955 Presence of coronary angioplasty implant and graft: Secondary | ICD-10-CM

## 2023-01-17 DIAGNOSIS — Z953 Presence of xenogenic heart valve: Secondary | ICD-10-CM | POA: Diagnosis not present

## 2023-01-17 DIAGNOSIS — F17210 Nicotine dependence, cigarettes, uncomplicated: Secondary | ICD-10-CM | POA: Diagnosis present

## 2023-01-17 DIAGNOSIS — D631 Anemia in chronic kidney disease: Secondary | ICD-10-CM | POA: Diagnosis present

## 2023-01-17 DIAGNOSIS — Z7982 Long term (current) use of aspirin: Secondary | ICD-10-CM

## 2023-01-17 DIAGNOSIS — D72829 Elevated white blood cell count, unspecified: Secondary | ICD-10-CM

## 2023-01-17 DIAGNOSIS — R6510 Systemic inflammatory response syndrome (SIRS) of non-infectious origin without acute organ dysfunction: Secondary | ICD-10-CM | POA: Diagnosis present

## 2023-01-17 DIAGNOSIS — I150 Renovascular hypertension: Secondary | ICD-10-CM | POA: Diagnosis not present

## 2023-01-17 DIAGNOSIS — R0789 Other chest pain: Secondary | ICD-10-CM | POA: Diagnosis present

## 2023-01-17 DIAGNOSIS — I5042 Chronic combined systolic (congestive) and diastolic (congestive) heart failure: Secondary | ICD-10-CM | POA: Diagnosis present

## 2023-01-17 DIAGNOSIS — Z7952 Long term (current) use of systemic steroids: Secondary | ICD-10-CM

## 2023-01-17 DIAGNOSIS — N186 End stage renal disease: Secondary | ICD-10-CM | POA: Diagnosis present

## 2023-01-17 DIAGNOSIS — N2581 Secondary hyperparathyroidism of renal origin: Secondary | ICD-10-CM | POA: Diagnosis present

## 2023-01-17 DIAGNOSIS — I080 Rheumatic disorders of both mitral and aortic valves: Secondary | ICD-10-CM | POA: Diagnosis present

## 2023-01-17 DIAGNOSIS — F32A Depression, unspecified: Secondary | ICD-10-CM | POA: Diagnosis present

## 2023-01-17 DIAGNOSIS — I428 Other cardiomyopathies: Secondary | ICD-10-CM | POA: Diagnosis present

## 2023-01-17 DIAGNOSIS — E785 Hyperlipidemia, unspecified: Secondary | ICD-10-CM | POA: Diagnosis present

## 2023-01-17 DIAGNOSIS — D696 Thrombocytopenia, unspecified: Secondary | ICD-10-CM | POA: Diagnosis present

## 2023-01-17 DIAGNOSIS — Z823 Family history of stroke: Secondary | ICD-10-CM

## 2023-01-17 DIAGNOSIS — Z992 Dependence on renal dialysis: Secondary | ICD-10-CM | POA: Diagnosis not present

## 2023-01-17 DIAGNOSIS — I214 Non-ST elevation (NSTEMI) myocardial infarction: Secondary | ICD-10-CM | POA: Diagnosis present

## 2023-01-17 DIAGNOSIS — I301 Infective pericarditis: Secondary | ICD-10-CM | POA: Insufficient documentation

## 2023-01-17 DIAGNOSIS — Z8249 Family history of ischemic heart disease and other diseases of the circulatory system: Secondary | ICD-10-CM

## 2023-01-17 DIAGNOSIS — G8929 Other chronic pain: Secondary | ICD-10-CM | POA: Diagnosis present

## 2023-01-17 DIAGNOSIS — Z8701 Personal history of pneumonia (recurrent): Secondary | ICD-10-CM

## 2023-01-17 DIAGNOSIS — I714 Abdominal aortic aneurysm, without rupture, unspecified: Secondary | ICD-10-CM | POA: Diagnosis present

## 2023-01-17 DIAGNOSIS — R066 Hiccough: Secondary | ICD-10-CM | POA: Diagnosis present

## 2023-01-17 DIAGNOSIS — Z8673 Personal history of transient ischemic attack (TIA), and cerebral infarction without residual deficits: Secondary | ICD-10-CM

## 2023-01-17 DIAGNOSIS — Z79899 Other long term (current) drug therapy: Secondary | ICD-10-CM

## 2023-01-17 DIAGNOSIS — R072 Precordial pain: Secondary | ICD-10-CM | POA: Diagnosis not present

## 2023-01-17 DIAGNOSIS — Z888 Allergy status to other drugs, medicaments and biological substances status: Secondary | ICD-10-CM

## 2023-01-17 DIAGNOSIS — Z8619 Personal history of other infectious and parasitic diseases: Secondary | ICD-10-CM

## 2023-01-17 DIAGNOSIS — R0781 Pleurodynia: Secondary | ICD-10-CM | POA: Diagnosis not present

## 2023-01-17 DIAGNOSIS — Z885 Allergy status to narcotic agent status: Secondary | ICD-10-CM

## 2023-01-17 DIAGNOSIS — R042 Hemoptysis: Secondary | ICD-10-CM | POA: Diagnosis not present

## 2023-01-17 DIAGNOSIS — E8889 Other specified metabolic disorders: Secondary | ICD-10-CM | POA: Diagnosis present

## 2023-01-17 DIAGNOSIS — Z860101 Personal history of adenomatous and serrated colon polyps: Secondary | ICD-10-CM

## 2023-01-17 DIAGNOSIS — R079 Chest pain, unspecified: Secondary | ICD-10-CM | POA: Diagnosis not present

## 2023-01-17 DIAGNOSIS — B3323 Viral pericarditis: Secondary | ICD-10-CM | POA: Diagnosis not present

## 2023-01-17 DIAGNOSIS — K219 Gastro-esophageal reflux disease without esophagitis: Secondary | ICD-10-CM | POA: Diagnosis present

## 2023-01-17 DIAGNOSIS — F419 Anxiety disorder, unspecified: Secondary | ICD-10-CM | POA: Diagnosis present

## 2023-01-17 LAB — URINALYSIS, W/ REFLEX TO CULTURE (INFECTION SUSPECTED)
Bacteria, UA: NONE SEEN
Bilirubin Urine: NEGATIVE
Glucose, UA: NEGATIVE mg/dL
Ketones, ur: NEGATIVE mg/dL
Leukocytes,Ua: NEGATIVE
Nitrite: NEGATIVE
Protein, ur: 100 mg/dL — AB
Specific Gravity, Urine: 1.018 (ref 1.005–1.030)
pH: 6 (ref 5.0–8.0)

## 2023-01-17 LAB — I-STAT CG4 LACTIC ACID, ED: Lactic Acid, Venous: 0.7 mmol/L (ref 0.5–1.9)

## 2023-01-17 LAB — COMPREHENSIVE METABOLIC PANEL
ALT: <5 U/L (ref 0–44)
AST: 97 U/L — ABNORMAL HIGH (ref 15–41)
Albumin: 2.9 g/dL — ABNORMAL LOW (ref 3.5–5.0)
Alkaline Phosphatase: 58 U/L (ref 38–126)
Anion gap: 13 (ref 5–15)
BUN: 110 mg/dL — ABNORMAL HIGH (ref 6–20)
CO2: 23 mmol/L (ref 22–32)
Calcium: 9.5 mg/dL (ref 8.9–10.3)
Chloride: 98 mmol/L (ref 98–111)
Creatinine, Ser: 8.98 mg/dL — ABNORMAL HIGH (ref 0.61–1.24)
GFR, Estimated: 7 mL/min — ABNORMAL LOW (ref >60)
Glucose, Bld: 84 mg/dL (ref 70–99)
Potassium: 4.6 mmol/L (ref 3.5–5.1)
Sodium: 134 mmol/L — ABNORMAL LOW (ref 135–145)
Total Bilirubin: 0.8 mg/dL (ref 0.3–1.2)
Total Protein: 6.2 g/dL — ABNORMAL LOW (ref 6.5–8.1)

## 2023-01-17 LAB — CBC WITH DIFFERENTIAL/PLATELET
Abs Immature Granulocytes: 0 10*3/uL (ref 0.00–0.07)
Basophils Absolute: 0 10*3/uL (ref 0.0–0.1)
Basophils Relative: 0 %
Eosinophils Absolute: 0 10*3/uL (ref 0.0–0.5)
Eosinophils Relative: 0 %
HCT: 27.8 % — ABNORMAL LOW (ref 39.0–52.0)
Hemoglobin: 9 g/dL — ABNORMAL LOW (ref 13.0–17.0)
Lymphocytes Relative: 5 %
Lymphs Abs: 1.6 10*3/uL (ref 0.7–4.0)
MCH: 30.8 pg (ref 26.0–34.0)
MCHC: 32.4 g/dL (ref 30.0–36.0)
MCV: 95.2 fL (ref 80.0–100.0)
Monocytes Absolute: 1.2 10*3/uL — ABNORMAL HIGH (ref 0.1–1.0)
Monocytes Relative: 4 %
Neutro Abs: 28.3 10*3/uL — ABNORMAL HIGH (ref 1.7–7.7)
Neutrophils Relative %: 91 %
Platelets: 275 10*3/uL (ref 150–400)
RBC: 2.92 MIL/uL — ABNORMAL LOW (ref 4.22–5.81)
RDW: 15.5 % (ref 11.5–15.5)
WBC: 31.1 10*3/uL — ABNORMAL HIGH (ref 4.0–10.5)
nRBC: 0 % (ref 0.0–0.2)
nRBC: 0 /100{WBCs}

## 2023-01-17 LAB — I-STAT CHEM 8, ED
BUN: 112 mg/dL — ABNORMAL HIGH (ref 6–20)
Calcium, Ion: 1.18 mmol/L (ref 1.15–1.40)
Chloride: 99 mmol/L (ref 98–111)
Creatinine, Ser: 9.8 mg/dL — ABNORMAL HIGH (ref 0.61–1.24)
Glucose, Bld: 81 mg/dL (ref 70–99)
HCT: 30 % — ABNORMAL LOW (ref 39.0–52.0)
Hemoglobin: 10.2 g/dL — ABNORMAL LOW (ref 13.0–17.0)
Potassium: 4.7 mmol/L (ref 3.5–5.1)
Sodium: 134 mmol/L — ABNORMAL LOW (ref 135–145)
TCO2: 25 mmol/L (ref 22–32)

## 2023-01-17 LAB — PROTIME-INR
INR: 1.2 (ref 0.8–1.2)
Prothrombin Time: 15.2 s (ref 11.4–15.2)

## 2023-01-17 LAB — TROPONIN I (HIGH SENSITIVITY)
Troponin I (High Sensitivity): 1215 ng/L (ref <18)
Troponin I (High Sensitivity): 1495 ng/L (ref <18)
Troponin I (High Sensitivity): 1562 ng/L (ref <18)

## 2023-01-17 LAB — BRAIN NATRIURETIC PEPTIDE: B Natriuretic Peptide: 1461.9 pg/mL — ABNORMAL HIGH (ref 0.0–100.0)

## 2023-01-17 MED ORDER — SENNOSIDES-DOCUSATE SODIUM 8.6-50 MG PO TABS: 1.0000 | ORAL_TABLET | Freq: Every evening | ORAL | Status: DC | PRN

## 2023-01-17 MED ORDER — HYDROMORPHONE HCL 1 MG/ML IJ SOLN: 1.0000 mg | Freq: Four times a day (QID) | INTRAMUSCULAR | Status: DC | PRN

## 2023-01-17 MED ORDER — VANCOMYCIN HCL 750 MG/150ML IV SOLN
750.0000 mg | INTRAVENOUS | Status: DC
  Administered 2023-01-19: 750 mg via INTRAVENOUS
  Filled 2023-01-17 (×3): qty 150

## 2023-01-17 MED ORDER — ONDANSETRON HCL 4 MG/2ML IJ SOLN
4.0000 mg | Freq: Four times a day (QID) | INTRAMUSCULAR | Status: DC | PRN
  Administered 2023-01-21: 4 mg via INTRAVENOUS
  Filled 2023-01-17: qty 2

## 2023-01-17 MED ORDER — SODIUM CHLORIDE 0.9 % IV SOLN
2.0000 g | INTRAVENOUS | Status: DC
  Administered 2023-01-18 – 2023-01-20 (×3): 2 g via INTRAVENOUS
  Filled 2023-01-17 (×4): qty 20

## 2023-01-17 MED ORDER — ONDANSETRON HCL 4 MG PO TABS: 4.0000 mg | ORAL_TABLET | Freq: Four times a day (QID) | ORAL | Status: DC | PRN

## 2023-01-17 MED ORDER — VANCOMYCIN HCL 1500 MG/300ML IV SOLN
1500.0000 mg | Freq: Once | INTRAVENOUS | Status: AC
  Administered 2023-01-17: 1500 mg via INTRAVENOUS
  Filled 2023-01-17: qty 300

## 2023-01-17 MED ORDER — HEPARIN BOLUS VIA INFUSION
4000.0000 [IU] | Freq: Once | INTRAVENOUS | Status: AC
  Administered 2023-01-17: 4000 [IU] via INTRAVENOUS
  Filled 2023-01-17: qty 4000

## 2023-01-17 MED ORDER — SODIUM CHLORIDE 0.9 % IV SOLN
1.0000 g | Freq: Once | INTRAVENOUS | Status: AC
  Administered 2023-01-17: 1 g via INTRAVENOUS
  Filled 2023-01-17: qty 10

## 2023-01-17 MED ORDER — ACETAMINOPHEN 325 MG PO TABS: 650.0000 mg | ORAL_TABLET | Freq: Four times a day (QID) | ORAL | Status: DC | PRN

## 2023-01-17 MED ORDER — NITROGLYCERIN IN D5W 200-5 MCG/ML-% IV SOLN
0.0000 ug/min | INTRAVENOUS | Status: DC
  Administered 2023-01-17: 5 ug/min via INTRAVENOUS
  Filled 2023-01-17: qty 250

## 2023-01-17 MED ORDER — VANCOMYCIN HCL IN DEXTROSE 1-5 GM/200ML-% IV SOLN: 1000.0000 mg | Freq: Once | INTRAVENOUS | Status: DC

## 2023-01-17 MED ORDER — NEPRO/CARBSTEADY PO LIQD
237.0000 mL | Freq: Two times a day (BID) | ORAL | Status: DC
  Administered 2023-01-18 – 2023-01-21 (×4): 237 mL via ORAL

## 2023-01-17 MED ORDER — MECLIZINE HCL 25 MG PO TABS: 25.0000 mg | ORAL_TABLET | Freq: Three times a day (TID) | ORAL | Status: DC | PRN

## 2023-01-17 MED ORDER — CEFTRIAXONE SODIUM 1 G IJ SOLR
1.0000 g | Freq: Once | INTRAMUSCULAR | Status: AC
  Administered 2023-01-17: 1 g via INTRAVENOUS
  Filled 2023-01-17: qty 10

## 2023-01-17 MED ORDER — OXYCODONE HCL 5 MG PO TABS
5.0000 mg | ORAL_TABLET | Freq: Four times a day (QID) | ORAL | Status: DC | PRN
  Administered 2023-01-17 – 2023-01-20 (×7): 5 mg via ORAL
  Filled 2023-01-17 (×7): qty 1

## 2023-01-17 MED ORDER — HYDROMORPHONE HCL 1 MG/ML IJ SOLN
1.0000 mg | Freq: Once | INTRAMUSCULAR | Status: AC
  Administered 2023-01-17: 1 mg via INTRAVENOUS
  Filled 2023-01-17: qty 1

## 2023-01-17 MED ORDER — ACETAMINOPHEN 650 MG RE SUPP: 650.0000 mg | Freq: Four times a day (QID) | RECTAL | Status: DC | PRN

## 2023-01-17 MED ORDER — AMLODIPINE BESYLATE 10 MG PO TABS
10.0000 mg | ORAL_TABLET | Freq: Every day | ORAL | Status: DC
  Administered 2023-01-18 – 2023-01-23 (×6): 10 mg via ORAL
  Filled 2023-01-17 (×6): qty 1

## 2023-01-17 MED ORDER — HYDRALAZINE HCL 50 MG PO TABS
50.0000 mg | ORAL_TABLET | Freq: Three times a day (TID) | ORAL | Status: DC
  Administered 2023-01-17 – 2023-01-22 (×14): 50 mg via ORAL
  Filled 2023-01-17 (×15): qty 1

## 2023-01-17 MED ORDER — HYDROMORPHONE HCL 1 MG/ML IJ SOLN
1.0000 mg | Freq: Four times a day (QID) | INTRAMUSCULAR | Status: DC | PRN
  Administered 2023-01-18 (×2): 1 mg via INTRAVENOUS
  Filled 2023-01-17 (×2): qty 1

## 2023-01-17 MED ORDER — UMECLIDINIUM BROMIDE 62.5 MCG/ACT IN AEPB
1.0000 | INHALATION_SPRAY | Freq: Every day | RESPIRATORY_TRACT | Status: DC
  Administered 2023-01-18 – 2023-01-22 (×3): 1 via RESPIRATORY_TRACT
  Filled 2023-01-17: qty 7

## 2023-01-17 MED ORDER — ARFORMOTEROL TARTRATE 15 MCG/2ML IN NEBU
15.0000 ug | INHALATION_SOLUTION | Freq: Two times a day (BID) | RESPIRATORY_TRACT | Status: DC
  Administered 2023-01-18 – 2023-01-22 (×8): 15 ug via RESPIRATORY_TRACT
  Filled 2023-01-17 (×10): qty 2

## 2023-01-17 MED ORDER — HEPARIN (PORCINE) 25000 UT/250ML-% IV SOLN
1550.0000 [IU]/h | INTRAVENOUS | Status: DC
  Administered 2023-01-17: 900 [IU]/h via INTRAVENOUS
  Administered 2023-01-18: 1150 [IU]/h via INTRAVENOUS
  Administered 2023-01-19: 1550 [IU]/h via INTRAVENOUS
  Filled 2023-01-17 (×3): qty 250

## 2023-01-17 MED ORDER — ASPIRIN 81 MG PO TBEC
81.0000 mg | DELAYED_RELEASE_TABLET | Freq: Every day | ORAL | Status: DC
  Administered 2023-01-18: 81 mg via ORAL
  Filled 2023-01-17: qty 1

## 2023-01-17 MED ORDER — HYDROMORPHONE HCL 1 MG/ML IJ SOLN
1.0000 mg | Freq: Four times a day (QID) | INTRAMUSCULAR | Status: DC | PRN
  Administered 2023-01-17: 1 mg via INTRAVENOUS
  Filled 2023-01-17: qty 1

## 2023-01-17 MED ORDER — LABETALOL HCL 5 MG/ML IV SOLN
10.0000 mg | Freq: Once | INTRAVENOUS | Status: DC
  Filled 2023-01-17: qty 4

## 2023-01-17 NOTE — H&P (Signed)
History and Physical    Patient: [name] ZOX:[id number] DOB: 18-Dec-1970 DOA: 01/17/2023 DOS: the patient was seen and examined on 01/17/2023 PCP: Loura Back, NP  Patient coming from: Home  Chief Complaint:  Chief Complaint  Patient presents with   Chest Pain   HPI: [name] is a 52 y.o. male with medical history significant of hypertension, hyperlipidemia, coronary artery disease status post CABG, MIs, CVA, ESRD on HD once a week, descending thoracic aortic aneurysm, history of aortic valve replacement with bioprosthetic valve, severe left ventricular venous distention, tobacco use and recent bacteremia presents today for evaluation of ongoing chest pain.  Patient was hospitalized from 10/26 to 10/31 with similar presentation of chest pain and had extensive workup.  He was ultimately found to have gram-positive bacteremia and was discharged home with 6 weeks of IV antibiotics during his HD sessions.  Patient tells me his chest pain has not significantly improved, he returned to the ER yesterday and was discharged home after unremarkable evaluation.  He returns today with similar midsternal chest pain that sometimes radiates to his back as well as fevers, chills and sweats.  He took his home nitroglycerin without any pain relief.  ED course: Low-grade fever of 100, RR 23, HR 115, BP 144/87 on admission.  Labs showed creatinine 8.98, K+ 4.6, sodium 134, WBC 31.1 (up from 17.7 yesterday), Hgb 9.0, troponin 1215->1495, BNP 1461. CXR with no acute cardiopulmonary process.  ID was consulted and recommended starting vancomycin and Rocephin.  Cardiology was consulted and recommended IV heparin and nitro drip.  Hospitalist was consulted for admission   Review of Systems: As mentioned in the history of present illness. All other systems reviewed and are negative. Past Medical History:  Diagnosis Date   Adenomatous colon polyp 08/2015   Anxiety    Aortic disease (HCC)    Aortic dissection  (HCC)    a. admx 04/2014 >> L renal infarct; a/c renal failure >> b.  s/p Bioprosthetic [place] and total arch replacement and staged endovascular repair of descending aortic aneurysm ([facility] - Dr. Kizzie Bane)   CAD (coronary artery disease)    a. LHC 4/16:  oD1 60%   Cardiomyopathy (HCC)    a. non-ischemic - probably related to untreated HTN and ETOH abuse - Echo 3/13 with EF 35-40% >> b. Echo 4/16: Severe LVH, EF 55-60%, moderate AI, moderate MR, mild LAE, trivial effusion, known type B dissection with communication between true and false lumens with suprasternal images suggesting dissection plane may propagate to at least left subclavian takeoff, root above aortic valve okay     Chronic abdominal pain    Chronic combined systolic and diastolic congestive heart failure (HCC)    a. 05/2011: Adm with pulm edema/HTN urgency, EF 35-40% with diffuse hypokinesis and moderate to severe mitral regurgitation. Cardiomyopathy likely due to uncontrolled HTN and ETOH abuse - cath deferred due to renal insufficiency (felt due to uncontrolled HTN). bElzie Rings MV 06/2011: EF 37% and no ischemia or infarction. c. EF 45-50% by echo 01/2012.   Chronic sinusitis    DDD (degenerative disc disease), lumbar    Depression    Descending thoracic aortic aneurysm (HCC)    Dissecting aneurysm of thoracic aorta (HCC)    ESRD (end stage renal disease) (HCC)    on dialysis 1-2 days per week (2 days ordered, only coming 1 day per week as of oct 2024). on dialysis 3-4 years total   ETOH abuse    a. Reported to  have quit 05/2011.   Frequent headaches    GERD (gastroesophageal reflux disease)    Headache(784.0)    "q other day" (08/08/2013)   Heart murmur    Hemorrhoid thrombosis    History of echocardiogram    Echo 1/17:  Severe LVH, EF 55-60%, no RWMA, Gr 2 DD, AVR ok, mild to mod MR, mild LAE, mild reduced RVSF, mod RAE   History of medication noncompliance    HYPERLIPIDEMIA    Hypertension    a. Hx of HTN urgency secondary to  noncompliance. b. urinary metanephrine and catecholeamine levels normal 2013.  c. Renal art [name] 1/16:  No evidence of renal artery stenosis noted bilaterally.   INGUINAL HERNIA    Pneumonia ~ 2013   Serrated adenoma of colon 08/2015   Stroke [facility])    Tobacco abuse    Valvular heart disease    a. Echo 05/2011: moderate to severe eccentric MR and mild to moderate AI with prolapsing left coronary cusp. b. Echo 01/2012: mild-mod AI, mild dilitation of aortic root, mild MR.;  c. Echo 1/16: Severe LVH consistent with hypertrophic cardio myopathy, EF 50%, no RWMA, mod AI, mild MR, mild RAE, dilated Ao root (40 mm);    Past Surgical History:  Procedure Laterality Date   A/V FISTULAGRAM Left 03/07/2021   Procedure: A/V FISTULAGRAM;  Surgeon: Chuck Hint, MD;  Location: [facility] INVASIVE CV LAB;  Service: Cardiovascular;  Laterality: Left;   A/V FISTULAGRAM Left 07/18/2021   Procedure: A/V Fistulagram;  Surgeon: Chuck Hint, MD;  Location: [facility] INVASIVE CV LAB;  Service: Cardiovascular;  Laterality: Left;   A/V FISTULAGRAM Left 12/25/2022   Procedure: A/V Fistulagram;  Surgeon: Leonie Douglas, MD;  Location: [facility] INVASIVE CV LAB;  Service: Cardiovascular;  Laterality: Left;   ANKLE SURGERY Bilateral    Fractures bilaterally   AORTIC VALVE SURGERY  09/2014   AV FISTULA PLACEMENT Left 09/09/2020   Procedure: LEFT ARM ARTERIOVENOUS (AV) FISTULA CREATION;  Surgeon: Leonie Douglas, MD;  Location: [facility] OR;  Service: Vascular;  Laterality: Left;   BASCILIC VEIN TRANSPOSITION Left 10/09/2020   Procedure: LEFT SECOND STAGE BASILIC VEIN TRANSPOSITION;  Surgeon: Leonie Douglas, MD;  Location: [facility] OR;  Service: Vascular;  Laterality: Left;  PERIPHERAL NERVE BLOCK   CORONARY ANGIOGRAPHY N/A 12/06/2017   Procedure: CORONARY ANGIOGRAPHY;  Surgeon: Lyn Records, MD;  Location: [facility] INVASIVE CV LAB;  Service: Cardiovascular;  Laterality: N/A;   CORONARY ANGIOGRAPHY N/A 09/26/2018   Procedure: CORONARY  ANGIOGRAPHY;  Surgeon: Yvonne Kendall, MD;  Location: [facility] INVASIVE CV LAB;  Service: Cardiovascular;  Laterality: N/A;   CORONARY STENT INTERVENTION N/A 12/10/2017   Procedure: CORONARY STENT INTERVENTION;  Surgeon: Lennette Bihari, MD;  Location: [facility] INVASIVE CV LAB;  Service: Cardiovascular;  Laterality: N/A;   FOOT FRACTURE SURGERY Bilateral 2004-2010   "got pins in both of them"   HEMORRHOID SURGERY N/A 06/15/2015   Procedure: HEMORRHOIDECTOMY;  Surgeon: Almond Lint, MD;  Location: [facility] OR;  Service: General;  Laterality: N/A;   INGUINAL HERNIA REPAIR Right ~ 1996   INSERTION OF DIALYSIS CATHETER N/A 09/09/2020   Procedure: INSERTION OF TUNNELED DIALYSIS PALINDROME 19cm CATHETER;  Surgeon: Leonie Douglas, MD;  Location: [facility] OR;  Service: Vascular;  Laterality: N/A;   LEFT HEART CATH AND CORONARY ANGIOGRAPHY N/A 02/05/2021   Procedure: LEFT HEART CATH AND CORONARY ANGIOGRAPHY;  Surgeon: Laurey Morale, MD;  Location: [facility] INVASIVE CV LAB;  Service: Cardiovascular;  Laterality: N/A;  LEFT HEART CATHETERIZATION WITH CORONARY ANGIOGRAM N/A 06/21/2014   Procedure: LEFT HEART CATHETERIZATION WITH CORONARY ANGIOGRAM;  Surgeon: Laurey Morale, MD;  Location: [facility] CATH LAB;  Service: Cardiovascular;  Laterality: N/A;   LOOP RECORDER INSERTION N/A 06/19/2016   Procedure: Loop Recorder Insertion;  Surgeon: [facility] Salvia, MD;  Location: [facility] INVASIVE CV LAB;  Service: Cardiovascular;  Laterality: N/A;   PERIPHERAL VASCULAR BALLOON ANGIOPLASTY  03/07/2021   Procedure: PERIPHERAL VASCULAR BALLOON ANGIOPLASTY;  Surgeon: Chuck Hint, MD;  Location: [facility] INVASIVE CV LAB;  Service: Cardiovascular;;   PERIPHERAL VASCULAR BALLOON ANGIOPLASTY  07/18/2021   Procedure: PERIPHERAL VASCULAR BALLOON ANGIOPLASTY;  Surgeon: Chuck Hint, MD;  Location: [facility] INVASIVE CV LAB;  Service: Cardiovascular;;   PERIPHERAL VASCULAR INTERVENTION Left 12/25/2022   Procedure: PERIPHERAL VASCULAR INTERVENTION;  Surgeon:  Leonie Douglas, MD;  Location: [facility] INVASIVE CV LAB;  Service: Cardiovascular;  Laterality: Left;  stent to brachiocephalic vein   PERIPHERAL VASCULAR ULTRASOUND/IVUS Left 12/25/2022   Procedure: Peripheral Vascular Ultrasound/IVUS;  Surgeon: Leonie Douglas, MD;  Location: [facility] INVASIVE CV LAB;  Service: Cardiovascular;  Laterality: Left;   TEE WITHOUT CARDIOVERSION N/A 03/12/2016   Procedure: TRANSESOPHAGEAL ECHOCARDIOGRAM (TEE);  Surgeon: Thurmon Fair, MD;  Location: [facility] ENDOSCOPY;  Service: Cardiovascular;  Laterality: N/A;   TEE WITHOUT CARDIOVERSION N/A 10/16/2020   Procedure: TRANSESOPHAGEAL ECHOCARDIOGRAM (TEE);  Surgeon: Laurey Morale, MD;  Location: [facility] ENDOSCOPY;  Service: Cardiovascular;  Laterality: N/A;   TRANSESOPHAGEAL ECHOCARDIOGRAM (CATH LAB) N/A 01/13/2023   Procedure: TRANSESOPHAGEAL ECHOCARDIOGRAM;  Surgeon: Quintella Reichert, MD;  Location: [facility] INVASIVE CV LAB;  Service: Cardiovascular;  Laterality: N/A;   UPPER EXTREMITY VENOGRAPHY Right 07/18/2021   Procedure: UPPER EXTREMITY VENOGRAPHY;  Surgeon: Chuck Hint, MD;  Location: [facility] INVASIVE CV LAB;  Service: Cardiovascular;  Laterality: Right;   Social History:  reports that he has been smoking cigarettes. He has a 5.8 pack-year smoking history. He has been exposed to tobacco smoke. He has never used smokeless tobacco. He reports that he does not currently use alcohol after a past usage of about 1.0 standard drink of alcohol per week. He reports current drug use. Drug: Marijuana.  Allergies  Allergen Reactions   Clonidine Derivatives Nausea And Vomiting and Other (See Comments)    Tablets by mouth = Tolerated  Patches on the skin = Nausea & vomiting after wearing 4-5 days    Imdur [Isosorbide Nitrate] Other (See Comments)    Headaches -- patient instructed to continue taking    Tramadol Other (See Comments)    Headaches     Family History  Problem Relation Age of Onset   Hypertension Mother    Hypertension  Other    Colon cancer Paternal Uncle    Stroke Maternal Aunt    Heart attack Brother    Hypertension Brother    Diabetes Maternal Aunt    Lung cancer Maternal Uncle    Stroke Maternal Uncle    Other Sister        "breathing machine at night"   Headache Sister    Thyroid disease Sister    Throat cancer Neg Hx    Pancreatic cancer Neg Hx    Esophageal cancer Neg Hx    Kidney disease Neg Hx    Liver disease Neg Hx     Prior to Admission medications   Medication Sig Start Date End Date Taking? Authorizing Provider  acetaminophen (TYLENOL) 325 MG tablet Take 2 tablets (650 mg total) by mouth every  4 (four) hours as needed. 01/14/23 01/14/24  Burnadette Pop, MD  albuterol (VENTOLIN HFA) 108 (90 Base) MCG/ACT inhaler Inhale 1 puff into the lungs every 6 (six) hours as needed for wheezing or shortness of breath. 05/24/21   [provider]  amLODipine (NORVASC) 10 MG tablet TAKE 1 TABLET(10 MG) BY MOUTH DAILY Patient taking differently: Take 10 mg by mouth daily. 07/15/21   Laurey Morale, MD  aspirin EC 81 MG EC tablet Take 1 tablet (81 mg total) by mouth daily. 12/12/17   Marcelino Duster, PA  ceFAZolin (ANCEF) IVPB Inject 2 g into the vein Every Tuesday,Thursday,and Saturday with dialysis. Indication:  MSSE bacteremia Last Day of Therapy:  02/22/23 Labs - Once weekly:  CBC/D and BMP, Labs - Once weekly: ESR and CRP Method of administration: Per hemodialysis protocol 01/14/23 02/22/23  Burnadette Pop, MD  Evolocumab (REPATHA SURECLICK) 140 MG/ML SOAJ Inject 140 mg into the skin every 14 (fourteen) days. 10/28/22   Hilty, Lisette Abu, MD  hydrALAZINE (APRESOLINE) 100 MG tablet Take 0.5 tablets (50 mg total) by mouth 3 (three) times daily. 01/14/23   Burnadette Pop, MD  HYDROcodone-acetaminophen (NORCO/VICODIN) 5-325 MG tablet Take 1 tablet by mouth every 6 (six) hours as needed for moderate pain. 12/21/22   Vanetta Mulders, MD  lidocaine-prilocaine (EMLA) cream Apply 1 application.  topically as needed (prior to port being accessed). Use on dialysis days (Tues, Thurs and Sat)    [provider]  meclizine (ANTIVERT) 25 MG tablet Take 1 tablet (25 mg total) by mouth 3 (three) times daily as needed for dizziness. 12/21/22   Vanetta Mulders, MD  nitroGLYCERIN (NITROSTAT) 0.4 MG SL tablet Place 1 tablet (0.4 mg total) under the tongue every 5 (five) minutes as needed for chest pain. 01/14/23   Burnadette Pop, MD  oxyCODONE-acetaminophen (PERCOCET) 5-325 MG tablet Take 1 tablet by mouth every 4 (four) hours as needed for severe pain. Patient not taking: Reported on 01/09/2023 12/25/22 12/25/23  Leonie Douglas, MD  pantoprazole (PROTONIX) 40 MG tablet Take 40 mg by mouth daily.    [provider]  polyethylene glycol powder (GLYCOLAX/MIRALAX) 17 GM/SCOOP powder Take 17 g by mouth daily. 01/14/23   Burnadette Pop, MD  predniSONE (DELTASONE) 20 MG tablet Take 2 tablets (40 mg total) by mouth daily with breakfast for 3 days. 01/15/23 01/18/23  Burnadette Pop, MD  senna-docusate (SENOKOT-S) 8.6-50 MG tablet Take 2 tablets by mouth 2 (two) times daily. 01/14/23   Burnadette Pop, MD  STIOLTO RESPIMAT 2.5-2.5 MCG/ACT AERS Inhale 2 puffs into the lungs at bedtime. 12/29/22   [provider]   Physical Exam: Vitals:   01/17/23 2015 01/17/23 2030 01/17/23 2108 01/17/23 2135  BP: 136/76 137/77 (!) 162/91 (!) 178/92  Pulse: (!) 106 (!) 105    Resp: 18 17 18    Temp:   98.5 F (36.9 C)   TempSrc:   Oral   SpO2: 94% 94% 94% 96%  Weight:      Height:      General: Pleasant, well-appearing middle-age man laying in bed. No acute distress. Head: Normocephalic. Atraumatic. CV: RRR. Mechanical heart sounds. No murmurs, rubs, or gallops. No LE edema. Mild chest wall tenderness. Pulmonary: Lungs CTAB. Normal effort. No wheezing or rales. Decreased breath sounds at the bases. Abdominal: Soft, nontender, nondistended. Normal bowel sounds. Extremities: Normal range of  motion. LUL AV fistula with palpable thrill Skin: Warm and dry. No obvious rash or lesions. Neuro: A&Ox3. Moves all extremities. Normal  sensation. No focal deficit. Psych: Normal mood and affect  Data Reviewed: [name] EKG shows ST depressions in the lateral leads that are more pronounced compared to yesterday.  CTA dissection study from yesterday did not reveal any acute findings in the chest, abdomen or pelvis. The aneurysmal dilation were stable. creatinine 8.98, K+ 4.6, sodium 134, WBC 31.1 (up from 17.7 yesterday), Hgb 9.0, troponin 1215->1495, BNP 1461. CXR with no acute cardiopulmonary process.  Assessment and Plan: [name] is a 52 y.o. male with medical history significant of HTN, HLD, coronary artery disease status post CABG, MIs, CVA, ESRD on HD once a week, GERD, descending thoracic aortic aneurysm, aortic valve replacement with bioprosthetic valve, severe left ventricular venous distention, tobacco use and recent bacteremia presents today for evaluation of ongoing chest pain admitted for NSTEMI and sepsis of unknown source.  # Chest pain # NSTEMI ESRD patient with significant cardiac and vascular history presented today with ongoing chest pain. Today he was found to have significant elevation in his troponin to 1200 as well as ST depression on EKG. Type II demand ischemia possible in the setting of ongoing sepsis however patient still with active chest pain and troponin that is trending up.  He reports no relief from nitro drip which points away from a true acute coronary syndrome and would likely need coronary evaluation. Cardiology has been consulted and plans to see patient for further evaluation. -Cardiology consulted, appreciate recs -Continue nitro drip -Continue heparin drip -Trend troponins until flat -Pain control with prn Oxy and Dilaudid  # Sepsis # Hx of bacteremia Patient with a recent gram-positive bacteremia currently on home IV cefazolin here with ongoing fevers  and chills found to have significant leukocytosis, WBC 31.1. Lactic acid 0.7.  Patient is at baseline mental status and hemodynamically stable. -ID consulted, appreciate recs -Continue IV vancomycin and Rocephin -Follow-up repeat blood cultures -UA pending -Trend CBC, fever curve  # ESRD on HD Usually gets HD once a week on Tuesdays but has been going 3 times weekly in order to receive his IV antibiotics for the bacteremia. -Resume HD next week  # HTN BP elevated with SBP in the 140s to 170s.  Patient currently on a nitro drip. -Resume home amlodipine and hydralazine  # Chronic HFpEF TEE on 10/30 showed EF 60-65%, severe LVH, severely dilated LA, moderately dilated RA, moderate mitral valve regurg, and moderate stenosis of the aortic valve prosthetic. BNP elevated to 1400 however patient does not significantly fluid overloaded on exam. -Supplemental O2 as needed -Strict I&O's, daily weights  # CAD status post CABG # HLD -Repatha every 14 days -ASA 81 mg daily   # COPD -Resume home inhalers   # GERD -Continue Protonix   #Tobacco use disorder States he has cut down to about 1 cigarette/day.  -Encouraged complete smoking cessation   Advance Care Planning:   Code Status: Full Code   Consults: Cardiology, infectious disease  Family Communication: Discussed admission with spouse at bedside  Severity of Illness: The appropriate patient status for this patient is INPATIENT. Inpatient status is judged to be reasonable and necessary in order to provide the required intensity of service to ensure the patient's safety. The patient's presenting symptoms, physical exam findings, and initial radiographic and laboratory data in the context of their chronic comorbidities is felt to place them at high risk for further clinical deterioration. Furthermore, it is not anticipated that the patient will be medically stable for discharge from the hospital within 2 midnights  of admission.   * I  certify that at the point of admission it is my clinical judgment that the patient will require inpatient hospital care spanning beyond 2 midnights from the point of admission due to high intensity of service, high risk for further deterioration and high frequency of surveillance required.*  Author: Steffanie Rainwater, MD 01/17/2023 7:45 PM  For on call review [email or web address].

## 2023-01-17 NOTE — ED Triage Notes (Signed)
Pt arrived via [facility] from home for c/o left sided chest pain that radiates to mid upper back, [id number]. Pt took two nitroglycerin at home. Pt took ASA 324 mg at home. EMS gave nitrox1. Per EMS, initial bp 204/82. Pt dialysis pt Tues, Thurs, Sat

## 2023-01-17 NOTE — ED Provider Notes (Signed)
[place] EMERGENCY DEPARTMENT AT [facility] Provider Note   CSN: [id number] Arrival date & time: 01/17/23  1615     History  Chief Complaint  Patient presents with   Chest Pain    RADLEY BARTO is a 52 y.o. male history of CAD status post CABG, ESRD on dialysis, thoracic aneurysm status post repair and aortic valve replacement with prosthetic valve, recent admission with MSSA bacteremia here presenting with fever and chest pain.  Patient was recently admitted and was found to have Staph epidermidis bacteremia.  Patient did not have endocarditis at that time.  Infectious disease saw the patient and recommend 6 weeks of IV cefazolin.  Patient states that he has been getting that in dialysis.  He states that his last dialysis was Thursday.  He states that since discharge on the 31st he is running persistent fevers.  He states that he has been having low-grade temperature about 100-1 01.  He came in yesterday with worsening chest pain and had a dissection study that did not show dissection or pneumonia.  Patient states that since last night he has persistent chills and left-sided chest pain.  Due to persistent symptoms, patient came back to the ER.  The history is provided by the patient.       Home Medications Prior to Admission medications   Medication Sig Start Date End Date Taking? Authorizing Provider  acetaminophen (TYLENOL) 325 MG tablet Take 2 tablets (650 mg total) by mouth every 4 (four) hours as needed. 01/14/23 01/14/24  Burnadette Pop, MD  albuterol (VENTOLIN HFA) 108 (90 Base) MCG/ACT inhaler Inhale 1 puff into the lungs every 6 (six) hours as needed for wheezing or shortness of breath. 05/24/21   [provider]  amLODipine (NORVASC) 10 MG tablet TAKE 1 TABLET(10 MG) BY MOUTH DAILY Patient taking differently: Take 10 mg by mouth daily. 07/15/21   Laurey Morale, MD  aspirin EC 81 MG EC tablet Take 1 tablet (81 mg total) by mouth daily. 12/12/17   Marcelino Duster, PA  ceFAZolin (ANCEF) IVPB Inject 2 g into the vein Every Tuesday,Thursday,and Saturday with dialysis. Indication:  MSSE bacteremia Last Day of Therapy:  02/22/23 Labs - Once weekly:  CBC/D and BMP, Labs - Once weekly: ESR and CRP Method of administration: Per hemodialysis protocol 01/14/23 02/22/23  Burnadette Pop, MD  Evolocumab (REPATHA SURECLICK) 140 MG/ML SOAJ Inject 140 mg into the skin every 14 (fourteen) days. 10/28/22   Hilty, Lisette Abu, MD  hydrALAZINE (APRESOLINE) 100 MG tablet Take 0.5 tablets (50 mg total) by mouth 3 (three) times daily. 01/14/23   Burnadette Pop, MD  HYDROcodone-acetaminophen (NORCO/VICODIN) 5-325 MG tablet Take 1 tablet by mouth every 6 (six) hours as needed for moderate pain. 12/21/22   Vanetta Mulders, MD  lidocaine-prilocaine (EMLA) cream Apply 1 application. topically as needed (prior to port being accessed). Use on dialysis days (Tues, Thurs and Sat)    [provider]  meclizine (ANTIVERT) 25 MG tablet Take 1 tablet (25 mg total) by mouth 3 (three) times daily as needed for dizziness. 12/21/22   Vanetta Mulders, MD  nitroGLYCERIN (NITROSTAT) 0.4 MG SL tablet Place 1 tablet (0.4 mg total) under the tongue every 5 (five) minutes as needed for chest pain. 01/14/23   Burnadette Pop, MD  oxyCODONE-acetaminophen (PERCOCET) 5-325 MG tablet Take 1 tablet by mouth every 4 (four) hours as needed for severe pain. Patient not taking: Reported on 01/09/2023 12/25/22 12/25/23  Leonie Douglas, MD  pantoprazole (PROTONIX) 40 MG tablet Take 40 mg by mouth daily.    [provider]  polyethylene glycol powder (GLYCOLAX/MIRALAX) 17 GM/SCOOP powder Take 17 g by mouth daily. 01/14/23   Burnadette Pop, MD  predniSONE (DELTASONE) 20 MG tablet Take 2 tablets (40 mg total) by mouth daily with breakfast for 3 days. 01/15/23 01/18/23  Burnadette Pop, MD  senna-docusate (SENOKOT-S) 8.6-50 MG tablet Take 2 tablets by mouth 2 (two) times daily. 01/14/23    Burnadette Pop, MD  STIOLTO RESPIMAT 2.5-2.5 MCG/ACT AERS Inhale 2 puffs into the lungs at bedtime. 12/29/22   [provider]      Allergies    Clonidine derivatives, Imdur [isosorbide nitrate], and Tramadol    Review of Systems   Review of Systems  Cardiovascular:  Positive for chest pain.  All other systems reviewed and are negative.   Physical Exam Updated Vital Signs BP (!) 144/87   Pulse (!) 115   Temp 100 F (37.8 C) (Oral)   Resp (!) 23   Ht 5\' 11"  (1.803 m)   Wt 69.5 kg   SpO2 94%   BMI 21.37 kg/m  Physical Exam Vitals and nursing note reviewed.  Constitutional:      Comments: Uncomfortable and chronically ill  HENT:     Head: Normocephalic.  Eyes:     Extraocular Movements: Extraocular movements intact.     Pupils: Pupils are equal, round, and reactive to light.  Cardiovascular:     Rate and Rhythm: Normal rate and regular rhythm.     Heart sounds: Normal heart sounds.  Pulmonary:     Effort: Pulmonary effort is normal.     Comments: Diminished bilateral bases Chest:     Comments: Patient has large scar on the left side of the chest. Abdominal:     General: Bowel sounds are normal.     Palpations: Abdomen is soft.  Musculoskeletal:        General: Normal range of motion.     Cervical back: Normal range of motion and neck supple.  Skin:    General: Skin is warm.  Neurological:     General: No focal deficit present.     Mental Status: He is alert and oriented to person, place, and time.  Psychiatric:        Mood and Affect: Mood normal.        Behavior: Behavior normal.     ED Results / Procedures / Treatments   Labs (all labs ordered are listed, but only abnormal results are displayed) Labs Reviewed  CBC WITH DIFFERENTIAL/PLATELET - Abnormal; Notable for the following components:      Result Value   WBC 31.1 (*)    RBC 2.92 (*)    Hemoglobin 9.0 (*)    HCT 27.8 (*)    Neutro Abs 28.3 (*)    Monocytes Absolute 1.2 (*)    All  other components within normal limits  I-STAT CHEM 8, ED - Abnormal; Notable for the following components:   Sodium 134 (*)    BUN 112 (*)    Creatinine, Ser 9.80 (*)    Hemoglobin 10.2 (*)    HCT 30.0 (*)    All other components within normal limits  CULTURE, BLOOD (ROUTINE X 2)  CULTURE, BLOOD (ROUTINE X 2)  PROTIME-INR  COMPREHENSIVE METABOLIC PANEL  URINALYSIS, W/ REFLEX TO CULTURE (INFECTION SUSPECTED)  BRAIN NATRIURETIC PEPTIDE  I-STAT CG4 LACTIC ACID, ED  TROPONIN I (HIGH SENSITIVITY)    EKG EKG  Interpretation Date/Time:  Sunday January 17 2023 16:19:39 EST Ventricular Rate:  127 PR Interval:  146 QRS Duration:  99 QT Interval:  314 QTC Calculation: 457 R Axis:   1  Text Interpretation: Sinus tachycardia Left atrial enlargement LVH with secondary repolarization abnormality No significant change since last tracing Confirmed by Richardean Canal ([id number]) on 01/17/2023 5:28:29 PM  Radiology DG Chest Port 1 View  Result Date: 01/17/2023 CLINICAL DATA:  Shortness of breath. Left chest pain and mid upper back pain for 1 week. EXAM: PORTABLE CHEST 1 VIEW COMPARISON:  Radiographs 01/16/2023 and 01/09/2023. Chest CTA 01/16/2023. FINDINGS: 1711 hours. There are extensive postsurgical changes from previous thoracic aorta stent grafting and aortic valve replacement. The heart size and mediastinal contours are stable. Stable mild scarring at the left lung base. The lungs otherwise appear clear. There is no pleural effusion or pneumothorax. Loop recorder and telemetry leads are noted. The bones appear unchanged. IMPRESSION: No evidence of acute cardiopulmonary process. Stable postsurgical changes. Electronically Signed   By: Carey Bullocks M.D.   On: 01/17/2023 17:30   CT Angio Chest/Abd/Pel for Dissection W and/or Wo Contrast  Result Date: 01/16/2023 CLINICAL DATA:  Acute aortic syndrome. EXAM: CT ANGIOGRAPHY CHEST, ABDOMEN AND PELVIS TECHNIQUE: Non-contrast CT of the chest was initially  obtained. Multidetector CT imaging through the chest, abdomen and pelvis was performed using the standard protocol during bolus administration of intravenous contrast. Multiplanar reconstructed images and MIPs were obtained and reviewed to evaluate the vascular anatomy. RADIATION DOSE REDUCTION: This exam was performed according to the departmental dose-optimization program which includes automated exposure control, adjustment of the mA and/or kV according to patient size and/or use of iterative reconstruction technique. CONTRAST:  OMNIPAQUE IOHEXOL 350 MG/ML SOLN COMPARISON:  01/09/2023.  04/27/2022 FINDINGS: CTA CHEST FINDINGS Cardiovascular: The heart is enlarged. No substantial pericardial effusion. Status post aortic valve replacement. Endograft identified in the thoracic aorta, similar to prior studies and patent. Postoperative change from debranching noted with patency of the branch vessel anatomy. The lumen of the left brachiocephalic vein stent opacifies consistent with patency. Enlargement of the pulmonary outflow tract/main pulmonary arteries suggests pulmonary arterial hypertension. No large central pulmonary embolus. Mediastinum/Nodes: No mediastinal lymphadenopathy. There is no hilar lymphadenopathy. The esophagus has normal imaging features. There is no axillary lymphadenopathy. Lungs/Pleura: Subtle changes of centrilobular and paraseptal emphysema noted. Dependent atelectasis. No pleural effusion. Musculoskeletal: No worrisome lytic or sclerotic osseous abnormality. Review of the MIP images confirms the above findings. CTA ABDOMEN AND PELVIS FINDINGS VASCULAR Aorta: Postsurgical changes noted in the abdominal aorta, distal to the stent graft. Aneurysmal dilatation of the distal abdominal aorta measured previously at 3.2 cm is stable at 3.2 cm today. Fluid collection adjacent to the operative site described previously is stable, measuring 3.7 x 3.4 cm today compared to 3.7 x 3.5 cm previously.  Celiac axis, and SMA are widely patent. Origin of the left renal artery not well demonstrated and similar to prior although left renal artery does opacify. Right renal artery appears widely patent. Celiac: Postsurgical change. As before, origin appears somewhat patulous without evidence for flow limiting stenosis. SMA: Surgical changes similar to prior with patulous origin. No features to suggest flow limiting stenosis. Renals: Stable.  Both renal arteries opacified, similar to prior. IMA: IMA does opacify. Inflow: Interval small dilatation of both common iliac arteries, similar to prior. Right common iliac artery measures 2.0 cm diameter compared to 2.2 cm previously. 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 Atherosclerotic disease characterized as both external iliac arteries, patent bilaterally. Internal iliac arteries are patent bilaterally. Veins: No obvious venous abnormality within the limitations of this arterial phase study. Review of the MIP images confirms the above findings. NON-VASCULAR Hepatobiliary: Hypodensity in the right liver is too small to characterize but statistically most likely benign. Layering tiny calcified gallstones evident (167/24). No intrahepatic or extrahepatic biliary dilation. Pancreas: No focal mass lesion. No dilatation of the main duct. No intraparenchymal cyst. No peripancreatic edema. Spleen: No splenomegaly. No suspicious focal mass lesion. Adrenals/Urinary Tract: No adrenal nodule or mass. No hydronephrosis or overtly suspicious renal mass. No hydroureter. The urinary bladder appears normal for the degree of distention. Stomach/Bowel: Stomach is unremarkable. No gastric wall thickening. No evidence of outlet obstruction. Duodenum is normally positioned as is the ligament of Treitz. No small bowel wall thickening. No small bowel dilatation. Moderate to large stool volume. Lymphatic: No discernible lymphadenopathy in the  abdomen or pelvis. Reproductive: Prostate gland is enlarged. Other: None. Musculoskeletal: No worrisome lytic or sclerotic osseous abnormality. Review of the MIP images confirms the above findings. IMPRESSION: 1. Stable exam. No new or acute findings in the chest, abdomen, or pelvis. 2. Stable postsurgical changes in the thoracic and abdominal aorta. Stable aneurysmal dilatation of the distal abdominal aorta measuring 3.2 cm diameter. Stable fluid collection adjacent to the operative site in the abdominal aorta. 3. 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 4. Enlargement of the pulmonary outflow tract/main pulmonary arteries suggests pulmonary arterial hypertension. 5. Cholelithiasis. 6. Prostatomegaly. 7. Aortic Atherosclerosis (ICD10-I70.0) and Emphysema (ICD10-J43.9). Electronically Signed   By: Kennith Center M.D.   On: 01/16/2023 10:11   DG Chest 2 View  Result Date: 01/16/2023 CLINICAL DATA:  Left-sided chest pain. EXAM: CHEST - 2 VIEW COMPARISON:  01/09/2023 FINDINGS: The lungs are clear without focal pneumonia, edema, pneumothorax or pleural effusion. The cardiopericardial silhouette is within normal limits for size. Thoracic aortic stent graft again noted. No acute bony abnormality. Telemetry leads overlie the chest. IMPRESSION: No active cardiopulmonary disease. Electronically Signed   By: Kennith Center M.D.   On: 01/16/2023 07:17    Procedures Procedures    CRITICAL CARE Performed by: Richardean Canal   Total critical care time: 39 minutes  Critical care time was exclusive of separately billable procedures and treating other patients.  Critical care was necessary to treat or prevent imminent or life-threatening deterioration.  Critical care was time spent personally by me on the following activities: development of treatment plan with patient and/or surrogate as well as nursing, discussions with  consultants, evaluation of patient's response to treatment, examination of patient, obtaining history from patient or surrogate, ordering and performing treatments and interventions, ordering and review of laboratory studies, ordering and review of radiographic studies, pulse oximetry and re-evaluation of patient's condition.   Medications Ordered in ED Medications  HYDROmorphone (DILAUDID) injection 1 mg (1 mg Intravenous Given 01/17/23 1648)    ED Course/ Medical Decision Making/ A&P                                 Medical Decision Making BROC CASPERS is a 52 y.o. male here presenting with chest pain and persistent fevers.  Patient is already on IV cefazolin.  Patient had recent admission for MSSA bacteremia.  Will  repeat the sepsis workup with CBC and CMP and lactate and cultures.  Patient just had a dissection study yesterday that did not show any pneumonia or colitis.  Plan to consult infectious disease.  5:30 pm  Patient's white blood cell count is 31,000.  It was 17,000 yesterday.  I discussed case with Dr. Thedore Mins from infectious disease.  She agreed with repeat blood cultures and wants Vanco and Rocephin and she will see the patient as consult  6:05 PM Patient's troponin is up to 1000 now up from 100 yesterday.  He also has worsening ST depressions.  I discussed case with cardiology Dr. Lajean Manes.  He agreed with heparin and he will see the patient as consult.  Likely demand ischemia.  At this point hospitalist to admit for sepsis of unclear etiology and elevated troponin   Problems Addressed: Elevated troponin: acute illness or injury Leukocytosis, unspecified type: acute illness or injury Sepsis, due to unspecified organism, unspecified whether acute organ dysfunction present [facility]): acute illness or injury  Amount and/or Complexity of Data Reviewed Labs: ordered. Decision-making details documented in ED Course. Radiology: ordered and independent interpretation performed.  Decision-making details documented in ED Course.  Risk Prescription drug management. Decision regarding hospitalization.    Final Clinical Impression(s) / ED Diagnoses Final diagnoses:  None    Rx / DC Orders ED Discharge Orders     None         Charlynne Pander, MD 01/17/23 [phone]

## 2023-01-17 NOTE — ED Notes (Signed)
ED TO INPATIENT HANDOFF REPORT  ED Nurse Name and Phone #: Kayra Crowell RN [phone]  S Name/Age/Gender [name] 52 y.o. male Room/Bed: 024C/024C  Code Status   Code Status: Full Code  Home/SNF/Other Home Patient oriented to: self, place, time, and situation Is this baseline? Yes   Triage Complete: Triage complete  Chief Complaint NSTEMI (non-ST elevated myocardial infarction) [facility]) [I21.4]  Triage Note Pt arrived via [facility] from home for c/o left sided chest pain that radiates to mid upper back, [id number]. Pt took two nitroglycerin at home. Pt took ASA 324 mg at home. EMS gave nitrox1. Per EMS, initial bp 204/82. Pt dialysis pt Tues, Thurs, Sat    Allergies Allergies  Allergen Reactions   Clonidine Derivatives Nausea And Vomiting and Other (See Comments)    Tablets by mouth = Tolerated  Patches on the skin = Nausea & vomiting after wearing 4-5 days    Imdur [Isosorbide Nitrate] Other (See Comments)    Headaches -- patient instructed to continue taking    Tramadol Other (See Comments)    Headaches     Level of Care/Admitting Diagnosis ED Disposition     ED Disposition  Admit   Condition  --   Comment  Hospital Area: [facility] [[id number]]  Level of Care: Telemetry Cardiac [[id number]]  May admit patient to [facility] or [facility] if equivalent level of care is available:: No  Covid Evaluation: Asymptomatic - no recent exposure (last 10 days) testing not required  Diagnosis: NSTEMI (non-ST elevated myocardial infarction) [facility]) [[id number]]  Admitting Physician: Steffanie Rainwater [[id number]]  Attending Physician: Steffanie Rainwater [[id number]]  Certification:: I certify this patient will need inpatient services for at least 2 midnights  Expected Medical Readiness: 01/20/2023          B Medical/Surgery History Past Medical History:  Diagnosis Date   Adenomatous colon polyp 08/2015   Anxiety    Aortic disease (HCC)    Aortic dissection (HCC)    a.  admx 04/2014 >> L renal infarct; a/c renal failure >> b.  s/p Bioprosthetic Bentall and total arch replacement and staged endovascular repair of descending aortic aneurysm ([facility] - Dr. Kizzie Bane)   CAD (coronary artery disease)    a. LHC 4/16:  oD1 60%   Cardiomyopathy (HCC)    a. non-ischemic - probably related to untreated HTN and ETOH abuse - Echo 3/13 with EF 35-40% >> b. Echo 4/16: Severe LVH, EF 55-60%, moderate AI, moderate MR, mild LAE, trivial effusion, known type B dissection with communication between true and false lumens with suprasternal images suggesting dissection plane may propagate to at least left subclavian takeoff, root above aortic valve okay     Chronic abdominal pain    Chronic combined systolic and diastolic congestive heart failure (HCC)    a. 05/2011: Adm with pulm edema/HTN urgency, EF 35-40% with diffuse hypokinesis and moderate to severe mitral regurgitation. Cardiomyopathy likely due to uncontrolled HTN and ETOH abuse - cath deferred due to renal insufficiency (felt due to uncontrolled HTN). bElzie Rings MV 06/2011: EF 37% and no ischemia or infarction. c. EF 45-50% by echo 01/2012.   Chronic sinusitis    DDD (degenerative disc disease), lumbar    Depression    Descending thoracic aortic aneurysm (HCC)    Dissecting aneurysm of thoracic aorta (HCC)    ESRD (end stage renal disease) (HCC)    on dialysis 1-2 days per week (2 days ordered, only coming 1 day per week as  of oct 2024). on dialysis 3-4 years total   ETOH abuse    a. Reported to have quit 05/2011.   Frequent headaches    GERD (gastroesophageal reflux disease)    Headache(784.0)    "q other day" (08/08/2013)   Heart murmur    Hemorrhoid thrombosis    History of echocardiogram    Echo 1/17:  Severe LVH, EF 55-60%, no RWMA, Gr 2 DD, AVR ok, mild to mod MR, mild LAE, mild reduced RVSF, mod RAE   History of medication noncompliance    HYPERLIPIDEMIA    Hypertension    a. Hx of HTN urgency secondary to  noncompliance. b. urinary metanephrine and catecholeamine levels normal 2013.  c. Renal art [name] 1/16:  No evidence of renal artery stenosis noted bilaterally.   INGUINAL HERNIA    Pneumonia ~ 2013   Serrated adenoma of colon 08/2015   Stroke [facility])    Tobacco abuse    Valvular heart disease    a. Echo 05/2011: moderate to severe eccentric MR and mild to moderate AI with prolapsing left coronary cusp. b. Echo 01/2012: mild-mod AI, mild dilitation of aortic root, mild MR.;  c. Echo 1/16: Severe LVH consistent with hypertrophic cardio myopathy, EF 50%, no RWMA, mod AI, mild MR, mild RAE, dilated Ao root (40 mm);    Past Surgical History:  Procedure Laterality Date   A/V FISTULAGRAM Left 03/07/2021   Procedure: A/V FISTULAGRAM;  Surgeon: Chuck Hint, MD;  Location: [facility] INVASIVE CV LAB;  Service: Cardiovascular;  Laterality: Left;   A/V FISTULAGRAM Left 07/18/2021   Procedure: A/V Fistulagram;  Surgeon: Chuck Hint, MD;  Location: [facility] INVASIVE CV LAB;  Service: Cardiovascular;  Laterality: Left;   A/V FISTULAGRAM Left 12/25/2022   Procedure: A/V Fistulagram;  Surgeon: Leonie Douglas, MD;  Location: [facility] INVASIVE CV LAB;  Service: Cardiovascular;  Laterality: Left;   ANKLE SURGERY Bilateral    Fractures bilaterally   AORTIC VALVE SURGERY  09/2014   AV FISTULA PLACEMENT Left 09/09/2020   Procedure: LEFT ARM ARTERIOVENOUS (AV) FISTULA CREATION;  Surgeon: Leonie Douglas, MD;  Location: [facility] OR;  Service: Vascular;  Laterality: Left;   BASCILIC VEIN TRANSPOSITION Left 10/09/2020   Procedure: LEFT SECOND STAGE BASILIC VEIN TRANSPOSITION;  Surgeon: Leonie Douglas, MD;  Location: [facility] OR;  Service: Vascular;  Laterality: Left;  PERIPHERAL NERVE BLOCK   CORONARY ANGIOGRAPHY N/A 12/06/2017   Procedure: CORONARY ANGIOGRAPHY;  Surgeon: Lyn Records, MD;  Location: [facility] INVASIVE CV LAB;  Service: Cardiovascular;  Laterality: N/A;   CORONARY ANGIOGRAPHY N/A 09/26/2018   Procedure: CORONARY  ANGIOGRAPHY;  Surgeon: Yvonne Kendall, MD;  Location: [facility] INVASIVE CV LAB;  Service: Cardiovascular;  Laterality: N/A;   CORONARY STENT INTERVENTION N/A 12/10/2017   Procedure: CORONARY STENT INTERVENTION;  Surgeon: Lennette Bihari, MD;  Location: [facility] INVASIVE CV LAB;  Service: Cardiovascular;  Laterality: N/A;   FOOT FRACTURE SURGERY Bilateral 2004-2010   "got pins in both of them"   HEMORRHOID SURGERY N/A 06/15/2015   Procedure: HEMORRHOIDECTOMY;  Surgeon: Almond Lint, MD;  Location: [facility] OR;  Service: General;  Laterality: N/A;   INGUINAL HERNIA REPAIR Right ~ 1996   INSERTION OF DIALYSIS CATHETER N/A 09/09/2020   Procedure: INSERTION OF TUNNELED DIALYSIS PALINDROME 19cm CATHETER;  Surgeon: Leonie Douglas, MD;  Location: [facility] OR;  Service: Vascular;  Laterality: N/A;   LEFT HEART CATH AND CORONARY ANGIOGRAPHY N/A 02/05/2021   Procedure: LEFT HEART CATH AND CORONARY ANGIOGRAPHY;  Surgeon:  Laurey Morale, MD;  Location: [facility] INVASIVE CV LAB;  Service: Cardiovascular;  Laterality: N/A;   LEFT HEART CATHETERIZATION WITH CORONARY ANGIOGRAM N/A 06/21/2014   Procedure: LEFT HEART CATHETERIZATION WITH CORONARY ANGIOGRAM;  Surgeon: Laurey Morale, MD;  Location: [facility] CATH LAB;  Service: Cardiovascular;  Laterality: N/A;   LOOP RECORDER INSERTION N/A 06/19/2016   Procedure: Loop Recorder Insertion;  Surgeon: [facility] Salvia, MD;  Location: [facility] INVASIVE CV LAB;  Service: Cardiovascular;  Laterality: N/A;   PERIPHERAL VASCULAR BALLOON ANGIOPLASTY  03/07/2021   Procedure: PERIPHERAL VASCULAR BALLOON ANGIOPLASTY;  Surgeon: Chuck Hint, MD;  Location: [facility] INVASIVE CV LAB;  Service: Cardiovascular;;   PERIPHERAL VASCULAR BALLOON ANGIOPLASTY  07/18/2021   Procedure: PERIPHERAL VASCULAR BALLOON ANGIOPLASTY;  Surgeon: Chuck Hint, MD;  Location: [facility] INVASIVE CV LAB;  Service: Cardiovascular;;   PERIPHERAL VASCULAR INTERVENTION Left 12/25/2022   Procedure: PERIPHERAL VASCULAR INTERVENTION;  Surgeon:  Leonie Douglas, MD;  Location: [facility] INVASIVE CV LAB;  Service: Cardiovascular;  Laterality: Left;  stent to brachiocephalic vein   PERIPHERAL VASCULAR ULTRASOUND/IVUS Left 12/25/2022   Procedure: Peripheral Vascular Ultrasound/IVUS;  Surgeon: Leonie Douglas, MD;  Location: [facility] INVASIVE CV LAB;  Service: Cardiovascular;  Laterality: Left;   TEE WITHOUT CARDIOVERSION N/A 03/12/2016   Procedure: TRANSESOPHAGEAL ECHOCARDIOGRAM (TEE);  Surgeon: Thurmon Fair, MD;  Location: [facility] ENDOSCOPY;  Service: Cardiovascular;  Laterality: N/A;   TEE WITHOUT CARDIOVERSION N/A 10/16/2020   Procedure: TRANSESOPHAGEAL ECHOCARDIOGRAM (TEE);  Surgeon: Laurey Morale, MD;  Location: [facility] ENDOSCOPY;  Service: Cardiovascular;  Laterality: N/A;   TRANSESOPHAGEAL ECHOCARDIOGRAM (CATH LAB) N/A 01/13/2023   Procedure: TRANSESOPHAGEAL ECHOCARDIOGRAM;  Surgeon: Quintella Reichert, MD;  Location: [facility] INVASIVE CV LAB;  Service: Cardiovascular;  Laterality: N/A;   UPPER EXTREMITY VENOGRAPHY Right 07/18/2021   Procedure: UPPER EXTREMITY VENOGRAPHY;  Surgeon: Chuck Hint, MD;  Location: [facility] INVASIVE CV LAB;  Service: Cardiovascular;  Laterality: Right;     A IV Location/Drains/Wounds Patient Lines/Drains/Airways Status     Active Line/Drains/Airways     Name Placement date Placement time Site Days   Peripheral IV 01/17/23 20 G Right Antecubital 01/17/23  1641  Antecubital  less than 1   Peripheral IV 01/17/23 20 G Posterior;Right Hand 01/17/23  1641  Hand  less than 1   Peripheral IV 01/17/23 20 G Posterior;Right Forearm 01/17/23  1854  Forearm  less than 1   Fistula / Graft Left Upper arm Arteriovenous fistula 09/09/20  1117  Upper arm  860   Fistula / Graft Left Upper arm --  --  Upper arm  --            Intake/Output Last 24 hours  Intake/Output Summary (Last 24 hours) at 01/17/2023 2011 Last data filed at 01/17/2023 1850 Gross per 24 hour  Intake 100.24 ml  Output --  Net 100.24 ml    Labs/Imaging Results  for orders placed or performed during the hospital encounter of 01/17/23 (from the past 48 hour(s))  Comprehensive metabolic panel     Status: Abnormal   Collection Time: 01/17/23  4:43 PM  Result Value Ref Range   Sodium 134 (L) 135 - 145 mmol/L   Potassium 4.6 3.5 - 5.1 mmol/L   Chloride 98 98 - 111 mmol/L   CO2 23 22 - 32 mmol/L   Glucose, Bld 84 70 - 99 mg/dL    Comment: Glucose reference range applies only to samples taken after fasting for at least 8 hours.   BUN 110 (  H) 6 - 20 mg/dL   Creatinine, Ser 1.61 (H) 0.61 - 1.24 mg/dL   Calcium 9.5 8.9 - 09.6 mg/dL   Total Protein 6.2 (L) 6.5 - 8.1 g/dL   Albumin 2.9 (L) 3.5 - 5.0 g/dL   AST 97 (H) 15 - 41 U/L   ALT <5 0 - 44 U/L   Alkaline Phosphatase 58 38 - 126 U/L   Total Bilirubin 0.8 0.3 - 1.2 mg/dL   GFR, Estimated 7 (L) >60 mL/min    Comment: (NOTE) Calculated using the CKD-EPI Creatinine Equation (2021)    Anion gap 13 5 - 15    Comment: Performed at [facility], [address], [place], [place] [address]  CBC with Differential     Status: Abnormal   Collection Time: 01/17/23  4:43 PM  Result Value Ref Range   WBC 31.1 (H) 4.0 - 10.5 K/uL   RBC 2.92 (L) 4.22 - 5.81 MIL/uL   Hemoglobin 9.0 (L) 13.0 - 17.0 g/dL   HCT 98.1 (L) 19.1 - 47.8 %   MCV 95.2 80.0 - 100.0 fL   MCH 30.8 26.0 - 34.0 pg   MCHC 32.4 30.0 - 36.0 g/dL   RDW 29.5 62.1 - 30.8 %   Platelets 275 150 - 400 K/uL   nRBC 0.0 0.0 - 0.2 %   Neutrophils Relative % 91 %   Neutro Abs 28.3 (H) 1.7 - 7.7 K/uL   Lymphocytes Relative 5 %   Lymphs Abs 1.6 0.7 - 4.0 K/uL   Monocytes Relative 4 %   Monocytes Absolute 1.2 (H) 0.1 - 1.0 K/uL   Eosinophils Relative 0 %   Eosinophils Absolute 0.0 0.0 - 0.5 K/uL   Basophils Relative 0 %   Basophils Absolute 0.0 0.0 - 0.1 K/uL   nRBC 0 0 /100 WBC   Abs Immature Granulocytes 0.00 0.00 - 0.07 K/uL   Schistocytes PRESENT    Polychromasia PRESENT     Comment: Performed at [facility], [address]  [place], [place] [address]  Protime-INR     Status: None   Collection Time: 01/17/23  4:43 PM  Result Value Ref Range   Prothrombin Time 15.2 11.4 - 15.2 seconds   INR 1.2 0.8 - 1.2    Comment: (NOTE) INR goal varies based on device and disease states. Performed at [facility], [address] [place], [place] [address]   Troponin I (High Sensitivity)     Status: Abnormal   Collection Time: 01/17/23  4:43 PM  Result Value Ref Range   Troponin I (High Sensitivity) 1,215 (HH) <18 ng/L    Comment: CRITICAL RESULT CALLED TO, READ BACK BY AND VERIFIED WITH BANKS, A. RN @1741  01/17/23 SATRAINR (NOTE) Elevated high sensitivity troponin I (hsTnI) values and significant  changes across serial measurements may suggest ACS but many other  chronic and acute conditions are known to elevate hsTnI results.  Refer to the "Links" section for chest pain algorithms and additional  guidance. Performed at [facility] Lab, [address], [place], [place] [address]   Brain natriuretic peptide     Status: Abnormal   Collection Time: 01/17/23  4:43 PM  Result Value Ref Range   B Natriuretic Peptide 1,461.9 (H) 0.0 - 100.0 pg/mL    Comment: Performed at [facility], [address] [place], [place] [address]  I-stat chem 8, ED (not at [facility], [facility] or [facility])     Status: Abnormal  Collection Time: 01/17/23  4:56 PM  Result Value Ref Range   Sodium 134 (L) 135 - 145 mmol/L   Potassium 4.7 3.5 - 5.1 mmol/L   Chloride 99 98 - 111 mmol/L   BUN 112 (H) 6 - 20 mg/dL   Creatinine, Ser 7.06 (H) 0.61 - 1.24 mg/dL   Glucose, Bld 81 70 - 99 mg/dL    Comment: Glucose reference range applies only to samples taken after fasting for at least 8 hours.   Calcium, Ion 1.18 1.15 - 1.40 mmol/L   TCO2 25 22 - 32 mmol/L   Hemoglobin 10.2 (L) 13.0 - 17.0 g/dL   HCT 23.7 (L) 62.8 - 31.5 %  I-Stat Lactic Acid, ED     Status: None   Collection Time: 01/17/23  4:57 PM  Result Value Ref Range   Lactic Acid, Venous 0.7  0.5 - 1.9 mmol/L  Troponin I (High Sensitivity)     Status: Abnormal   Collection Time: 01/17/23  6:50 PM  Result Value Ref Range   Troponin I (High Sensitivity) 1,495 (HH) <18 ng/L    Comment: CRITICAL VALUE NOTED. VALUE IS CONSISTENT WITH PREVIOUSLY REPORTED/CALLED VALUE (NOTE) Elevated high sensitivity troponin I (hsTnI) values and significant  changes across serial measurements may suggest ACS but many other  chronic and acute conditions are known to elevate hsTnI results.  Refer to the "Links" section for chest pain algorithms and additional  guidance. Performed at [facility], [address], [place], [place] [address]    DG Chest Port 1 View  Result Date: 01/17/2023 CLINICAL DATA:  Shortness of breath. Left chest pain and mid upper back pain for 1 week. EXAM: PORTABLE CHEST 1 VIEW COMPARISON:  Radiographs 01/16/2023 and 01/09/2023. Chest CTA 01/16/2023. FINDINGS: 1711 hours. There are extensive postsurgical changes from previous thoracic aorta stent grafting and aortic valve replacement. The heart size and mediastinal contours are stable. Stable mild scarring at the left lung base. The lungs otherwise appear clear. There is no pleural effusion or pneumothorax. Loop recorder and telemetry leads are noted. The bones appear unchanged. IMPRESSION: No evidence of acute cardiopulmonary process. Stable postsurgical changes. Electronically Signed   By: Carey Bullocks M.D.   On: 01/17/2023 17:30   CT Angio Chest/Abd/Pel for Dissection W and/or Wo Contrast  Result Date: 01/16/2023 CLINICAL DATA:  Acute aortic syndrome. EXAM: CT ANGIOGRAPHY CHEST, ABDOMEN AND PELVIS TECHNIQUE: Non-contrast CT of the chest was initially obtained. Multidetector CT imaging through the chest, abdomen and pelvis was performed using the standard protocol during bolus administration of intravenous contrast. Multiplanar reconstructed images and MIPs were obtained and reviewed to evaluate the vascular anatomy.  RADIATION DOSE REDUCTION: This exam was performed according to the departmental dose-optimization program which includes automated exposure control, adjustment of the mA and/or kV according to patient size and/or use of iterative reconstruction technique. CONTRAST:  OMNIPAQUE IOHEXOL 350 MG/ML SOLN COMPARISON:  01/09/2023.  04/27/2022 FINDINGS: CTA CHEST FINDINGS Cardiovascular: The heart is enlarged. No substantial pericardial effusion. Status post aortic valve replacement. Endograft identified in the thoracic aorta, similar to prior studies and patent. Postoperative change from debranching noted with patency of the branch vessel anatomy. The lumen of the left brachiocephalic vein stent opacifies consistent with patency. Enlargement of the pulmonary outflow tract/main pulmonary arteries suggests pulmonary arterial hypertension. No large central pulmonary embolus. Mediastinum/Nodes: No mediastinal lymphadenopathy. There is no hilar lymphadenopathy. The esophagus has normal imaging features. There is no axillary lymphadenopathy. Lungs/Pleura: Subtle changes of centrilobular  and paraseptal emphysema noted. Dependent atelectasis. No pleural effusion. Musculoskeletal: No worrisome lytic or sclerotic osseous abnormality. Review of the MIP images confirms the above findings. CTA ABDOMEN AND PELVIS FINDINGS VASCULAR Aorta: Postsurgical changes noted in the abdominal aorta, distal to the stent graft. Aneurysmal dilatation of the distal abdominal aorta measured previously at 3.2 cm is stable at 3.2 cm today. Fluid collection adjacent to the operative site described previously is stable, measuring 3.7 x 3.4 cm today compared to 3.7 x 3.5 cm previously. Celiac axis, and SMA are widely patent. Origin of the left renal artery not well demonstrated and similar to prior although left renal artery does opacify. Right renal artery appears widely patent. Celiac: Postsurgical change. As before, origin appears somewhat patulous  without evidence for flow limiting stenosis. SMA: Surgical changes similar to prior with patulous origin. No features to suggest flow limiting stenosis. Renals: Stable.  Both renal arteries opacified, similar to prior. IMA: IMA does opacify. Inflow: Interval small dilatation of both common iliac arteries, similar to prior. Right common iliac artery measures 2.0 cm diameter compared to 2.2 cm previously. 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 Atherosclerotic disease characterized as both external iliac arteries, patent bilaterally. Internal iliac arteries are patent bilaterally. Veins: No obvious venous abnormality within the limitations of this arterial phase study. Review of the MIP images confirms the above findings. NON-VASCULAR Hepatobiliary: Hypodensity in the right liver is too small to characterize but statistically most likely benign. Layering tiny calcified gallstones evident (167/24). No intrahepatic or extrahepatic biliary dilation. Pancreas: No focal mass lesion. No dilatation of the main duct. No intraparenchymal cyst. No peripancreatic edema. Spleen: No splenomegaly. No suspicious focal mass lesion. Adrenals/Urinary Tract: No adrenal nodule or mass. No hydronephrosis or overtly suspicious renal mass. No hydroureter. The urinary bladder appears normal for the degree of distention. Stomach/Bowel: Stomach is unremarkable. No gastric wall thickening. No evidence of outlet obstruction. Duodenum is normally positioned as is the ligament of Treitz. No small bowel wall thickening. No small bowel dilatation. Moderate to large stool volume. Lymphatic: No discernible lymphadenopathy in the abdomen or pelvis. Reproductive: Prostate gland is enlarged. Other: None. Musculoskeletal: No worrisome lytic or sclerotic osseous abnormality. Review of the MIP images confirms the above findings. IMPRESSION: 1. Stable exam. No new or acute findings in the chest,  abdomen, or pelvis. 2. Stable postsurgical changes in the thoracic and abdominal aorta. Stable aneurysmal dilatation of the distal abdominal aorta measuring 3.2 cm diameter. Stable fluid collection adjacent to the operative site in the abdominal aorta. 3. 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 4. Enlargement of the pulmonary outflow tract/main pulmonary arteries suggests pulmonary arterial hypertension. 5. Cholelithiasis. 6. Prostatomegaly. 7. Aortic Atherosclerosis (ICD10-I70.0) and Emphysema (ICD10-J43.9). Electronically Signed   By: Kennith Center M.D.   On: 01/16/2023 10:11   DG Chest 2 View  Result Date: 01/16/2023 CLINICAL DATA:  Left-sided chest pain. EXAM: CHEST - 2 VIEW COMPARISON:  01/09/2023 FINDINGS: The lungs are clear without focal pneumonia, edema, pneumothorax or pleural effusion. The cardiopericardial silhouette is within normal limits for size. Thoracic aortic stent graft again noted. No acute bony abnormality. Telemetry leads overlie the chest. IMPRESSION: No active cardiopulmonary disease. Electronically Signed   By: Kennith Center M.D.   On: 01/16/2023 07:17    Pending Labs [employer] (From admission, onward)     Start  Ordered   01/19/23 0500  Heparin level (unfractionated)  Daily,   R     Placed in "And" Linked Group   01/17/23 1810   01/18/23 0500  CBC  Daily,   R     Placed in "And" Linked Group   01/17/23 1810   01/18/23 0200  Heparin level (unfractionated)  Once-Timed,   URGENT        01/17/23 1810   01/17/23 1623  Culture, blood (Routine x 2)  BLOOD CULTURE X 2,   R (with STAT occurrences)      01/17/23 1623   01/17/23 1623  Urinalysis, w/ Reflex to Culture (Infection Suspected) -Urine, Clean Catch  Once,   URGENT       Question:  Specimen Source  Answer:  Urine, Clean Catch   01/17/23 1623            Vitals/Pain Today's Vitals   01/17/23 1944 01/17/23 1945  01/17/23 2000 01/17/23 2005  BP:  (!) 152/77 (!) 142/77   Pulse:  (!) 103 (!) 103   Resp:  18 18   Temp:      TempSrc:      SpO2:  98% 97%   Weight:      Height:      PainSc: 8    10-Worst pain ever    Isolation Precautions No active isolations  Medications Medications  heparin ADULT infusion 100 units/mL (25000 units/245mL) (900 Units/hr Intravenous New Bag/Given 01/17/23 1853)  nitroGLYCERIN 50 mg in dextrose 5 % 250 mL (0.2 mg/mL) infusion (10 mcg/min Intravenous Rate/Dose Change 01/17/23 1943)  acetaminophen (TYLENOL) tablet 650 mg (has no administration in time range)    Or  acetaminophen (TYLENOL) suppository 650 mg (has no administration in time range)  senna-docusate (Senokot-S) tablet 1 tablet (has no administration in time range)  ondansetron (ZOFRAN) tablet 4 mg (has no administration in time range)    Or  ondansetron (ZOFRAN) injection 4 mg (has no administration in time range)  HYDROmorphone (DILAUDID) injection 1 mg (1 mg Intravenous Given 01/17/23 2002)  HYDROmorphone (DILAUDID) injection 1 mg (1 mg Intravenous Given 01/17/23 1648)  cefTRIAXone (ROCEPHIN) 1 g in sodium chloride 0.9 % 100 mL IVPB (0 g Intravenous Stopped 01/17/23 1850)  vancomycin (VANCOREADY) IVPB 1500 mg/300 mL (1,500 mg Intravenous New Bag/Given 01/17/23 1808)  heparin bolus via infusion 4,000 Units (4,000 Units Intravenous Bolus from Bag 01/17/23 1853)    Mobility walks     Focused Assessments Cardiac Assessment Handoff:  Cardiac Rhythm: Sinus tachycardia Lab Results  Component Value Date   CKTOTAL 122 03/27/2014   CKMB 1.5 05/23/2011   TROPONINI 0.11 (HH) 12/10/2017   Lab Results  Component Value Date   DDIMER >20.00 (H) 03/03/2016   Does the Patient currently have chest pain? Yes    R Recommendations: See Admitting Provider Note  Report given to:   Additional Notes: Given PRN pain medication for chest pain.

## 2023-01-17 NOTE — Progress Notes (Signed)
PHARMACY - ANTICOAGULATION CONSULT NOTE  Pharmacy Consult for heparin Indication: chest pain/ACS  Allergies  Allergen Reactions   Clonidine Derivatives Nausea And Vomiting and Other (See Comments)    Tablets by mouth = Tolerated  Patches on the skin = Nausea & vomiting after wearing 4-5 days    Imdur [Isosorbide Nitrate] Other (See Comments)    Headaches -- patient instructed to continue taking    Tramadol Other (See Comments)    Headaches     Patient Measurements: Height: 5\' 11"  (180.3 cm) Weight: 69.5 kg (153 lb 3.5 oz) IBW/kg (Calculated) : 75.3 Heparin Dosing Weight: TBW  Vital Signs: Temp: 100 F (37.8 C) (11/03 1618) Temp Source: Oral (11/03 1618) BP: 144/87 (11/03 1648) Pulse Rate: 115 (11/03 1648)  Labs: Recent Labs    01/16/23 0643 01/16/23 0930 01/17/23 1643 01/17/23 1656  HGB 10.2*  --  9.0* 10.2*  HCT 31.3*  --  27.8* 30.0*  PLT 264  --  275  --   LABPROT  --   --  15.2  --   INR  --   --  1.2  --   CREATININE 7.45*  --  8.98* 9.80*  TROPONINIHS 139* 148* 1,215*  --     Estimated Creatinine Clearance: 8.9 mL/min (A) (by C-G formula based on SCr of 9.8 mg/dL (H)).   Medical History: Past Medical History:  Diagnosis Date   Adenomatous colon polyp 08/2015   Anxiety    Aortic disease (HCC)    Aortic dissection (HCC)    a. admx 04/2014 >> L renal infarct; a/c renal failure >> b.  s/p Bioprosthetic Bent[place] and total arch replacement and staged endovascular repair of descending aortic aneurysm ([facility] - Dr. Kizzie Bane)   CAD (coronary artery disease)    a. LHC 4/16:  oD1 60%   Cardiomyopathy (HCC)    a. non-ischemic - probably related to untreated HTN and ETOH abuse - Echo 3/13 with EF 35-40% >> b. Echo 4/16: Severe LVH, EF 55-60%, moderate AI, moderate MR, mild LAE, trivial effusion, known type B dissection with communication between true and false lumens with suprasternal images suggesting dissection plane may propagate to at least left subclavian  takeoff, root above aortic valve okay     Chronic abdominal pain    Chronic combined systolic and diastolic congestive heart failure (HCC)    a. 05/2011: Adm with pulm edema/HTN urgency, EF 35-40% with diffuse hypokinesis and moderate to severe mitral regurgitation. Cardiomyopathy likely due to uncontrolled HTN and ETOH abuse - cath deferred due to renal insufficiency (felt due to uncontrolled HTN). bElzie Rings MV 06/2011: EF 37% and no ischemia or infarction. c. EF 45-50% by echo 01/2012.   Chronic sinusitis    DDD (degenerative disc disease), lumbar    Depression    Descending thoracic aortic aneurysm (HCC)    Dissecting aneurysm of thoracic aorta (HCC)    ESRD (end stage renal disease) (HCC)    on dialysis 1-2 days per week (2 days ordered, only coming 1 day per week as of oct 2024). on dialysis 3-4 years total   ETOH abuse    a. Reported to have quit 05/2011.   Frequent headaches    GERD (gastroesophageal reflux disease)    Headache(784.0)    "q other day" (08/08/2013)   Heart murmur    Hemorrhoid thrombosis    History of echocardiogram    Echo 1/17:  Severe LVH, EF 55-60%, no RWMA, Gr 2 DD, AVR ok, mild to mod MR,  mild LAE, mild reduced RVSF, mod RAE   History of medication noncompliance    HYPERLIPIDEMIA    Hypertension    a. Hx of HTN urgency secondary to noncompliance. b. urinary metanephrine and catecholeamine levels normal 2013.  c. Renal art [name] 1/16:  No evidence of renal artery stenosis noted bilaterally.   INGUINAL HERNIA    Pneumonia ~ 2013   Serrated adenoma of colon 08/2015   Stroke [facility])    Tobacco abuse    Valvular heart disease    a. Echo 05/2011: moderate to severe eccentric MR and mild to moderate AI with prolapsing left coronary cusp. b. Echo 01/2012: mild-mod AI, mild dilitation of aortic root, mild MR.;  c. Echo 1/16: Severe LVH consistent with hypertrophic cardio myopathy, EF 50%, no RWMA, mod AI, mild MR, mild RAE, dilated Ao root (40 mm);     Assessment: 52 YOM  presenting with CP, elevated troponin, he is not on anticoagulation PTA, chronic anemia of ESRD stable, plts wnl  Goal of Therapy:  Heparin level 0.3-0.7 units/ml Monitor platelets by anticoagulation protocol: Yes   Plan:  Heparin 4000 units IV x 1, and gtt at 900 units/hr F/u 8 hour heparin level F/u cards eval and recs  Daylene Posey, PharmD, [facility] Clinical Pharmacist ED Pharmacist Phone # [phone] 01/17/2023 6:09 PM

## 2023-01-17 NOTE — Progress Notes (Signed)
Pharmacy Antibiotic Note  [name] is a 52 y.o. male admitted on 01/17/2023 with  recent MSSA bacteremia - has been on IV ancef with HD since discharge on 10/31- plan was for 6 weeks. Pt states that he is still running peristent fevers . WBC up to 31. ID has been consulted and recommended antibiotics be broadened to Rocephin and Vancomycin Pharmacy has been consulted for Vancomycin dosing.  Pt with ESRD (recently changed to T/T/S schedule as op). Pt received Vanc 1500mg  load in ED ~1800  Plan: Vancomycin 750mg  IV qHD Will f/u HD schedule/tolerance, micro data, and pt's clinical condition Vanc levels prn   Height: 5\' 11"  (180.3 cm) Weight: 69.5 kg (153 lb 3.5 oz) IBW/kg (Calculated) : 75.3  Temp (24hrs), Avg:99.3 F (37.4 C), Min:98.5 F (36.9 C), Max:100 F (37.8 C)  Recent Labs  Lab 01/12/23 0925 01/13/23 0748 01/14/23 0847 01/16/23 0643 01/17/23 1643 01/17/23 1656 01/17/23 1657  WBC 15.1* 11.5* 12.3* 17.7* 31.1*  --   --   CREATININE 11.45*  --  8.97* 7.45* 8.98* 9.80*  --   LATICACIDVEN  --   --   --   --   --   --  0.7    Estimated Creatinine Clearance: 8.9 mL/min (A) (by C-G formula based on SCr of 9.8 mg/dL (H)).    Allergies  Allergen Reactions   Clonidine Derivatives Nausea And Vomiting and Other (See Comments)    Tablets by mouth = Tolerated  Patches on the skin = Nausea & vomiting after wearing 4-5 days    Imdur [Isosorbide Nitrate] Other (See Comments)    Headaches -- patient instructed to continue taking    Tramadol Other (See Comments)    Headaches     Antimicrobials this admission: 11/3 Vanc >>  11/3 Rocephin >>  Pta Ancef 10/26>>planned for 6 weeks  Microbiology results: 11/3 BCx:   Thank you for allowing pharmacy to be a part of this patient's care.  Christoper Fabian, PharmD, BCPS Please see amion for complete clinical pharmacist phone list 01/17/2023 10:11 PM

## 2023-01-18 ENCOUNTER — Inpatient Hospital Stay (HOSPITAL_COMMUNITY): Payer: 59

## 2023-01-18 DIAGNOSIS — I150 Renovascular hypertension: Secondary | ICD-10-CM | POA: Diagnosis not present

## 2023-01-18 DIAGNOSIS — R509 Fever, unspecified: Secondary | ICD-10-CM

## 2023-01-18 DIAGNOSIS — R0781 Pleurodynia: Secondary | ICD-10-CM

## 2023-01-18 DIAGNOSIS — A419 Sepsis, unspecified organism: Secondary | ICD-10-CM

## 2023-01-18 DIAGNOSIS — I214 Non-ST elevation (NSTEMI) myocardial infarction: Secondary | ICD-10-CM | POA: Diagnosis not present

## 2023-01-18 DIAGNOSIS — D72829 Elevated white blood cell count, unspecified: Secondary | ICD-10-CM | POA: Diagnosis not present

## 2023-01-18 HISTORY — DX: Sepsis, unspecified organism: A41.9

## 2023-01-18 LAB — CBC WITH DIFFERENTIAL/PLATELET
Abs Immature Granulocytes: 0.79 10*3/uL — ABNORMAL HIGH (ref 0.00–0.07)
Basophils Absolute: 0.1 10*3/uL (ref 0.0–0.1)
Basophils Relative: 0 %
Eosinophils Absolute: 0.1 10*3/uL (ref 0.0–0.5)
Eosinophils Relative: 0 %
HCT: 25.8 % — ABNORMAL LOW (ref 39.0–52.0)
Hemoglobin: 8.5 g/dL — ABNORMAL LOW (ref 13.0–17.0)
Immature Granulocytes: 2 %
Lymphocytes Relative: 4 %
Lymphs Abs: 1.4 10*3/uL (ref 0.7–4.0)
MCH: 31.4 pg (ref 26.0–34.0)
MCHC: 32.9 g/dL (ref 30.0–36.0)
MCV: 95.2 fL (ref 80.0–100.0)
Monocytes Absolute: 2.4 10*3/uL — ABNORMAL HIGH (ref 0.1–1.0)
Monocytes Relative: 7 %
Neutro Abs: 31.2 10*3/uL — ABNORMAL HIGH (ref 1.7–7.7)
Neutrophils Relative %: 87 %
Platelets: 239 10*3/uL (ref 150–400)
RBC: 2.71 MIL/uL — ABNORMAL LOW (ref 4.22–5.81)
RDW: 15.7 % — ABNORMAL HIGH (ref 11.5–15.5)
WBC: 36 10*3/uL — ABNORMAL HIGH (ref 4.0–10.5)
nRBC: 0 % (ref 0.0–0.2)

## 2023-01-18 LAB — RENAL FUNCTION PANEL
Albumin: 2.6 g/dL — ABNORMAL LOW (ref 3.5–5.0)
Anion gap: 15 (ref 5–15)
BUN: 112 mg/dL — ABNORMAL HIGH (ref 6–20)
CO2: 22 mmol/L (ref 22–32)
Calcium: 9.1 mg/dL (ref 8.9–10.3)
Chloride: 98 mmol/L (ref 98–111)
Creatinine, Ser: 9.28 mg/dL — ABNORMAL HIGH (ref 0.61–1.24)
GFR, Estimated: 6 mL/min — ABNORMAL LOW (ref >60)
Glucose, Bld: 87 mg/dL (ref 70–99)
Phosphorus: 6.1 mg/dL — ABNORMAL HIGH (ref 2.5–4.6)
Potassium: 4.6 mmol/L (ref 3.5–5.1)
Sodium: 135 mmol/L (ref 135–145)

## 2023-01-18 LAB — TROPONIN I (HIGH SENSITIVITY): Troponin I (High Sensitivity): 1397 ng/L (ref <18)

## 2023-01-18 LAB — HEPARIN LEVEL (UNFRACTIONATED)
Heparin Unfractionated: 0.11 [IU]/mL — ABNORMAL LOW (ref 0.30–0.70)
Heparin Unfractionated: 0.1 [IU]/mL — ABNORMAL LOW (ref 0.30–0.70)
Heparin Unfractionated: 0.23 [IU]/mL — ABNORMAL LOW (ref 0.30–0.70)

## 2023-01-18 MED ORDER — ASPIRIN 325 MG PO TBEC
650.0000 mg | DELAYED_RELEASE_TABLET | Freq: Three times a day (TID) | ORAL | Status: DC
  Administered 2023-01-18 – 2023-01-19 (×4): 650 mg via ORAL
  Filled 2023-01-18 (×4): qty 2

## 2023-01-18 MED ORDER — CHLORHEXIDINE GLUCONATE CLOTH 2 % EX PADS
6.0000 | MEDICATED_PAD | Freq: Every day | CUTANEOUS | Status: DC
  Administered 2023-01-19 – 2023-01-20 (×2): 6 via TOPICAL

## 2023-01-18 MED ORDER — HEPARIN BOLUS VIA INFUSION
2000.0000 [IU] | Freq: Once | INTRAVENOUS | Status: AC
  Administered 2023-01-18: 2000 [IU] via INTRAVENOUS
  Filled 2023-01-18: qty 2000

## 2023-01-18 MED ORDER — ACETAMINOPHEN 325 MG PO TABS: 650.0000 mg | ORAL_TABLET | Freq: Four times a day (QID) | ORAL | Status: DC | PRN

## 2023-01-18 MED ORDER — ACETAMINOPHEN 500 MG PO TABS
1000.0000 mg | ORAL_TABLET | Freq: Three times a day (TID) | ORAL | Status: DC
  Administered 2023-01-18 – 2023-01-23 (×12): 1000 mg via ORAL
  Filled 2023-01-18 (×15): qty 2

## 2023-01-18 MED ORDER — DICLOFENAC SODIUM 1 % EX GEL
2.0000 g | Freq: Four times a day (QID) | CUTANEOUS | Status: DC
  Administered 2023-01-18 – 2023-01-22 (×14): 2 g via TOPICAL
  Filled 2023-01-18: qty 100

## 2023-01-18 MED ORDER — PANTOPRAZOLE SODIUM 40 MG PO TBEC
40.0000 mg | DELAYED_RELEASE_TABLET | Freq: Two times a day (BID) | ORAL | Status: DC
  Administered 2023-01-18 – 2023-01-23 (×11): 40 mg via ORAL
  Filled 2023-01-18 (×11): qty 1

## 2023-01-18 MED ORDER — HYDROMORPHONE HCL 1 MG/ML IJ SOLN
1.0000 mg | INTRAMUSCULAR | Status: DC | PRN
  Administered 2023-01-18 (×2): 1 mg via INTRAVENOUS
  Filled 2023-01-18 (×2): qty 1

## 2023-01-18 MED ORDER — HYDROMORPHONE HCL 1 MG/ML IJ SOLN
1.0000 mg | INTRAMUSCULAR | Status: DC | PRN
  Administered 2023-01-18 – 2023-01-23 (×37): 1 mg via INTRAVENOUS
  Filled 2023-01-18 (×40): qty 1

## 2023-01-18 NOTE — Consult Note (Signed)
[facility]                                                                                        Patient Identification: Patient Name: Andre OROURKE MRN: [id number] Admit Date: 01/17/2023  4:15 PM Today's Date: 01/18/2023 Reason for consult: Leukocytosis, recent bacteremia Requesting provider: Dr. Lowell Guitar  Principal Problem:   NSTEMI (non-ST elevated myocardial infarction) [facility]) Active Problems:   Sepsis (HCC)   Antibiotics:  Vancomycin 11/3- Ceftriaxone 11/3-  Lines/Hardware: LUE AVF  Assessment 52 -year-old male with multiple co-morbidities including aortic dissection complicated with renal infarct s/p bioprosthetic Pentel and total arch replacement and staged endovascular repair of descending aortic aneurysm in 2016 ESRD on HD admitted with   # Fevers/chills a/w leukocytosis # Left sided pleuritic chest pain and back pain -  in the setting of Left brachiocephalic arteriovenous angioplasty and stenting  on 10/11. CT 01/16/23 Stable fluid collection adjacent to the operative site in the abdominal aorta. ? Association. Vascular surgery has been consulted  - Cardiology consulted: concerns for Hypertensive urgency w/underlying NSTEMI vs pericarditis. Plan for stress test and high dose aspirin for possible pericarditis   # Recent MSSE bacteremia with no clear source of infection.  TEE with moderate thickening of the aortic valve prosthesis - Left UE AVF with no signs of infection on exam   Recommendations  - Patient reports needing to terminate dialysis early due to chest pain/back pain and did not get the abtx that was supposed to be given at the end. This is possibly why he is having fevers/leukocytosis - Agree with Vascular to see esp regarding recent angioplasty and stenting on 10/11 and fluid collection near aorta - Continue Vancomycin and ceftriaxone for now pending blood cx 11/3  negative for 2 days, if negative can switch to cefazolin - Coxsackie A and B serology ordered due to concerns for pericarditis - Monitor CBC, CMP, Vancomycin trough  - Following   Rest of the management as per the primary team. Please call with questions or concerns.  Thank you for the consult  __________________________________________________________________________________________________________ HPI and Hospital Course: 52 year old male with PMH as below including aortic dissection complicated with renal infarct s/p bioprosthetic Pentel and total arch replacement and staged endovascular repair of descending aortic aneurysm in 2016 at [facility], ESRD on HD, CAD, recent admission 10/26-10/31 for MSSE bacteremia who presented to the ED on 11/3 via EMS for fevers, chills, left-sided chest pain radiating to mid upper back as well as shortness of breath for a week.  Took nitroglycerin, ASA at home.  EMS gave nitroglycerin, initial BP 204/82.  Patient reported he has been running persistent fever since discharge on 10/31. Patient was seen in ED on 11/2 with similar presentation with workup remarkable for WBC elevated to 17.7 negative dissection in CT, he was given analgesics and discharged   At ED Tmax 100, tachypneic and tachycardic Labs remarkable for albumin 2.9, AST 97 BNP elevated to 1461.9, troponin elevated to 1215, WBC elevated to 31.1 ( 17.7 on 11/2), hemoglobin at 9 Case was discussed with ID Dr. Thedore Mins, recommended repeat blood cultures as well as  vancomycin and ceftriaxone Cardiology was consulted and plan for heparinization, possible demand ischemia Patient was admitted for sepsis of unclear etiology and elevated troponin  ROS: General- Reports fever+, chills+, denies loss of appetite and loss of weight HEENT - Denies headache, blurry vision, neck pain, sinus pain Chest - Denies SOB or cough CVS- Denies any dizziness/lightheadedness, syncopal attacks, palpitations Abdomen- Denies any  vomiting, abdominal pain, hematochezia and diarrhea Neuro - Denies any weakness, numbness, tingling sensation Psych - Denies any changes in mood irritability or depressive symptoms GU- Denies any burning, dysuria, hematuria or increased frequency of urination Skin - denies any rashes/lesions MSK - denies any joint pain/swelling or restricted ROM   Past Medical History:  Diagnosis Date   Adenomatous colon polyp 08/2015   Anxiety    Aortic disease (HCC)    Aortic dissection (HCC)    a. admx 04/2014 >> L renal infarct; a/c renal failure >> b.  s/p Bioprosthetic Bentall and total arch replacement and staged endovascular repair of descending aortic aneurysm ([facility] - Dr. Kizzie Bane)   CAD (coronary artery disease)    a. LHC 4/16:  oD1 60%   Cardiomyopathy (HCC)    a. non-ischemic - probably related to untreated HTN and ETOH abuse - Echo 3/13 with EF 35-40% >> b. Echo 4/16: Severe LVH, EF 55-60%, moderate AI, moderate MR, mild LAE, trivial effusion, known type B dissection with communication between true and false lumens with suprasternal images suggesting dissection plane may propagate to at least left subclavian takeoff, root above aortic valve okay     Chronic abdominal pain    Chronic combined systolic and diastolic congestive heart failure (HCC)    a. 05/2011: Adm with pulm edema/HTN urgency, EF 35-40% with diffuse hypokinesis and moderate to severe mitral regurgitation. Cardiomyopathy likely due to uncontrolled HTN and ETOH abuse - cath deferred due to renal insufficiency (felt due to uncontrolled HTN). bElzie Rings MV 06/2011: EF 37% and no ischemia or infarction. c. EF 45-50% by echo 01/2012.   Chronic sinusitis    DDD (degenerative disc disease), lumbar    Depression    Descending thoracic aortic aneurysm (HCC)    Dissecting aneurysm of thoracic aorta (HCC)    ESRD (end stage renal disease) (HCC)    on dialysis 1-2 days per week (2 days ordered, only coming 1 day per week as of oct 2024). on  dialysis 3-4 years total   ETOH abuse    a. Reported to have quit 05/2011.   Frequent headaches    GERD (gastroesophageal reflux disease)    Headache(784.0)    "q other day" (08/08/2013)   Heart murmur    Hemorrhoid thrombosis    History of echocardiogram    Echo 1/17:  Severe LVH, EF 55-60%, no RWMA, Gr 2 DD, AVR ok, mild to mod MR, mild LAE, mild reduced RVSF, mod RAE   History of medication noncompliance    HYPERLIPIDEMIA    Hypertension    a. Hx of HTN urgency secondary to noncompliance. b. urinary metanephrine and catecholeamine levels normal 2013.  c. Renal art [name] 1/16:  No evidence of renal artery stenosis noted bilaterally.   INGUINAL HERNIA    Pneumonia ~ 2013   Serrated adenoma of colon 08/2015   Stroke [facility])    Tobacco abuse    Valvular heart disease    a. Echo 05/2011: moderate to severe eccentric MR and mild to moderate AI with prolapsing left coronary cusp. b. Echo 01/2012: mild-mod AI, mild dilitation of aortic  root, mild MR.;  c. Echo 1/16: Severe LVH consistent with hypertrophic cardio myopathy, EF 50%, no RWMA, mod AI, mild MR, mild RAE, dilated Ao root (40 mm);    Past Surgical History:  Procedure Laterality Date   A/V FISTULAGRAM Left 03/07/2021   Procedure: A/V FISTULAGRAM;  Surgeon: Chuck Hint, MD;  Location: [facility] INVASIVE CV LAB;  Service: Cardiovascular;  Laterality: Left;   A/V FISTULAGRAM Left 07/18/2021   Procedure: A/V Fistulagram;  Surgeon: Chuck Hint, MD;  Location: [facility] INVASIVE CV LAB;  Service: Cardiovascular;  Laterality: Left;   A/V FISTULAGRAM Left 12/25/2022   Procedure: A/V Fistulagram;  Surgeon: Leonie Douglas, MD;  Location: [facility] INVASIVE CV LAB;  Service: Cardiovascular;  Laterality: Left;   ANKLE SURGERY Bilateral    Fractures bilaterally   AORTIC VALVE SURGERY  09/2014   AV FISTULA PLACEMENT Left 09/09/2020   Procedure: LEFT ARM ARTERIOVENOUS (AV) FISTULA CREATION;  Surgeon: Leonie Douglas, MD;  Location: [facility] OR;  Service:  Vascular;  Laterality: Left;   BASCILIC VEIN TRANSPOSITION Left 10/09/2020   Procedure: LEFT SECOND STAGE BASILIC VEIN TRANSPOSITION;  Surgeon: Leonie Douglas, MD;  Location: [facility] OR;  Service: Vascular;  Laterality: Left;  PERIPHERAL NERVE BLOCK   CORONARY ANGIOGRAPHY N/A 12/06/2017   Procedure: CORONARY ANGIOGRAPHY;  Surgeon: Lyn Records, MD;  Location: [facility] INVASIVE CV LAB;  Service: Cardiovascular;  Laterality: N/A;   CORONARY ANGIOGRAPHY N/A 09/26/2018   Procedure: CORONARY ANGIOGRAPHY;  Surgeon: Yvonne Kendall, MD;  Location: [facility] INVASIVE CV LAB;  Service: Cardiovascular;  Laterality: N/A;   CORONARY STENT INTERVENTION N/A 12/10/2017   Procedure: CORONARY STENT INTERVENTION;  Surgeon: Lennette Bihari, MD;  Location: [facility] INVASIVE CV LAB;  Service: Cardiovascular;  Laterality: N/A;   FOOT FRACTURE SURGERY Bilateral 2004-2010   "got pins in both of them"   HEMORRHOID SURGERY N/A 06/15/2015   Procedure: HEMORRHOIDECTOMY;  Surgeon: Almond Lint, MD;  Location: [facility] OR;  Service: General;  Laterality: N/A;   INGUINAL HERNIA REPAIR Right ~ 1996   INSERTION OF DIALYSIS CATHETER N/A 09/09/2020   Procedure: INSERTION OF TUNNELED DIALYSIS PALINDROME 19cm CATHETER;  Surgeon: Leonie Douglas, MD;  Location: [facility] OR;  Service: Vascular;  Laterality: N/A;   LEFT HEART CATH AND CORONARY ANGIOGRAPHY N/A 02/05/2021   Procedure: LEFT HEART CATH AND CORONARY ANGIOGRAPHY;  Surgeon: Laurey Morale, MD;  Location: [facility] INVASIVE CV LAB;  Service: Cardiovascular;  Laterality: N/A;   LEFT HEART CATHETERIZATION WITH CORONARY ANGIOGRAM N/A 06/21/2014   Procedure: LEFT HEART CATHETERIZATION WITH CORONARY ANGIOGRAM;  Surgeon: Laurey Morale, MD;  Location: [facility] CATH LAB;  Service: Cardiovascular;  Laterality: N/A;   LOOP RECORDER INSERTION N/A 06/19/2016   Procedure: Loop Recorder Insertion;  Surgeon: [facility] Salvia, MD;  Location: [facility] INVASIVE CV LAB;  Service: Cardiovascular;  Laterality: N/A;   PERIPHERAL VASCULAR BALLOON  ANGIOPLASTY  03/07/2021   Procedure: PERIPHERAL VASCULAR BALLOON ANGIOPLASTY;  Surgeon: Chuck Hint, MD;  Location: [facility] INVASIVE CV LAB;  Service: Cardiovascular;;   PERIPHERAL VASCULAR BALLOON ANGIOPLASTY  07/18/2021   Procedure: PERIPHERAL VASCULAR BALLOON ANGIOPLASTY;  Surgeon: Chuck Hint, MD;  Location: [facility] INVASIVE CV LAB;  Service: Cardiovascular;;   PERIPHERAL VASCULAR INTERVENTION Left 12/25/2022   Procedure: PERIPHERAL VASCULAR INTERVENTION;  Surgeon: Leonie Douglas, MD;  Location: [facility] INVASIVE CV LAB;  Service: Cardiovascular;  Laterality: Left;  stent to brachiocephalic vein   PERIPHERAL VASCULAR ULTRASOUND/IVUS Left 12/25/2022   Procedure: Peripheral Vascular Ultrasound/IVUS;  Surgeon: Heath Lark  N, MD;  Location: [facility] INVASIVE CV LAB;  Service: Cardiovascular;  Laterality: Left;   TEE WITHOUT CARDIOVERSION N/A 03/12/2016   Procedure: TRANSESOPHAGEAL ECHOCARDIOGRAM (TEE);  Surgeon: Thurmon Fair, MD;  Location: [facility] ENDOSCOPY;  Service: Cardiovascular;  Laterality: N/A;   TEE WITHOUT CARDIOVERSION N/A 10/16/2020   Procedure: TRANSESOPHAGEAL ECHOCARDIOGRAM (TEE);  Surgeon: Laurey Morale, MD;  Location: [facility] ENDOSCOPY;  Service: Cardiovascular;  Laterality: N/A;   TRANSESOPHAGEAL ECHOCARDIOGRAM (CATH LAB) N/A 01/13/2023   Procedure: TRANSESOPHAGEAL ECHOCARDIOGRAM;  Surgeon: Quintella Reichert, MD;  Location: [facility] INVASIVE CV LAB;  Service: Cardiovascular;  Laterality: N/A;   UPPER EXTREMITY VENOGRAPHY Right 07/18/2021   Procedure: UPPER EXTREMITY VENOGRAPHY;  Surgeon: Chuck Hint, MD;  Location: [facility] INVASIVE CV LAB;  Service: Cardiovascular;  Laterality: Right;     Scheduled Meds:  amLODipine  10 mg Oral Daily   arformoterol  15 mcg Nebulization BID   And   umeclidinium bromide  1 puff Inhalation Daily   aspirin EC  81 mg Oral Daily   feeding supplement (NEPRO CARB STEADY)  237 mL Oral BID BM   hydrALAZINE  50 mg Oral TID   Continuous Infusions:   cefTRIAXone (ROCEPHIN)  IV     heparin 1,150 Units/hr (01/18/23 0738)   nitroGLYCERIN 30 mcg/min (01/18/23 0738)   [START ON 01/19/2023] vancomycin     PRN Meds:.acetaminophen **OR** acetaminophen, HYDROmorphone (DILAUDID) injection, meclizine, ondansetron **OR** ondansetron (ZOFRAN) IV, oxyCODONE, senna-docusate  Allergies  Allergen Reactions   Clonidine Derivatives Nausea And Vomiting and Other (See Comments)    Tablets by mouth = Tolerated  Patches on the skin = Nausea & vomiting after wearing 4-5 days    Imdur [Isosorbide Nitrate] Other (See Comments)    Headaches -- patient instructed to continue taking    Tramadol Other (See Comments)    Headaches    Social History   Socioeconomic History   Marital status: Married    Spouse name: Not on file   Number of children: 5   Years of education: 12   Highest education level: Not on file  Occupational History   Occupation: Disabled, part-time Corporate investment banker    Comment: Disability  Tobacco Use   Smoking status: Some Days    Current packs/day: 0.25    Average packs/day: 0.3 packs/day for 23.0 years (5.8 ttl pk-yrs)    Types: Cigarettes    Passive exposure: Current   Smokeless tobacco: Never   Tobacco comments:    3 to 4 per day  Vaping Use   Vaping status: Not on file  Substance and Sexual Activity   Alcohol use: Not Currently    Alcohol/week: 1.0 standard drink of alcohol    Types: 1 Cans of beer per week    Comment: On special occasion   Drug use: Yes    Types: Marijuana    Comment: 2 times/week - last use 10/06/20   Sexual activity: Yes  Other Topics Concern   Not on file  Social History Narrative   Fun: Enjoy his children   Social Determinants of Health   Financial Resource Strain: Low Risk  (07/07/2022)   Received from [facility]   Overall Financial Resource Strain (CARDIA)    Difficulty of Paying Living Expenses: Not hard at all  Food Insecurity: No Food Insecurity (01/17/2023)   Hunger  Vital Sign    Worried About Running Out of Food in the Last Year: Never true    Ran Out of Food in the Last Year: Never  true  Transportation Needs: No Transportation Needs (01/17/2023)   PRAPARE - Administrator, Civil Service (Medical): No    Lack of Transportation (Non-Medical): No  Physical Activity: Not on file  Stress: Not on file  Social Connections: Unknown (05/11/2022)   Received from [facility]   Social Network    Social Network: Not on file  Intimate Partner Violence: Not At Risk (01/17/2023)   Humiliation, Afraid, Rape, and Kick questionnaire    Fear of Current or Ex-Partner: No    Emotionally Abused: No    Physically Abused: No    Sexually Abused: No   Family History  Problem Relation Age of Onset   Hypertension Mother    Hypertension Other    Colon cancer Paternal Uncle    Stroke Maternal Aunt    Heart attack Brother    Hypertension Brother    Diabetes Maternal Aunt    Lung cancer Maternal Uncle    Stroke Maternal Uncle    Other Sister        "breathing machine at night"   Headache Sister    Thyroid disease Sister    Throat cancer Neg Hx    Pancreatic cancer Neg Hx    Esophageal cancer Neg Hx    Kidney disease Neg Hx    Liver disease Neg Hx    Vitals BP (!) 155/90 (BP Location: Right Arm)   Pulse (!) 106   Temp 100 F (37.8 C) (Oral)   Resp 20   Ht 5\' 11"  (1.803 m)   Wt 69.5 kg   SpO2 96%   BMI 21.37 kg/m    Physical Exam Constitutional: Adult male lying in the bed, appears frustrated due to chest pain    Comments: HEENT WNL  Cardiovascular:     Rate and Rhythm: Normal rate and regular rhythm.     Heart sounds: s1s2  Pulmonary:     Effort: Pulmonary effort is normal.     Comments: Normal breath sounds  Abdominal:     Palpations: Abdomen is soft.     Tenderness: Nondistended and nontender  Musculoskeletal:        General: No swelling or tenderness in peripheral joints  Skin:    Comments: He had a bleeding  from his right upper extremity PIV at the time of exam which RN was taking care of.  No signs of infection at the left upper extremity AV fistula  Neurological:     General: Awake, alert and oriented, following commands  Pertinent Microbiology Results for orders placed or performed during the hospital encounter of 01/09/23  Blood Culture (routine x 2)     Status: Abnormal   Collection Time: 01/09/23  3:39 PM   Specimen: BLOOD RIGHT ARM  Result Value Ref Range Status   Specimen Description   Final    BLOOD RIGHT ARM Performed at [facility], [address] [place], [place] [address]    Special Requests   Final    BOTTLES DRAWN AEROBIC AND ANAEROBIC Blood Culture adequate volume Performed at [facility], [address], [place], [place] [address]    Culture  Setup Time   Final    GRAM POSITIVE COCCI IN BOTH AEROBIC AND ANAEROBIC BOTTLES CRITICAL RESULT CALLED TO, READ BACK BY AND VERIFIED WITH: ABDULHAMID OLGIN [id number] AT 0858 BY EC Performed at [facility], [address] [place], [place] [address]    Culture STAPHYLOCOCCUS EPIDERMIDIS (A)  Final  Report Status 01/12/2023 FINAL  Final   Organism ID, Bacteria STAPHYLOCOCCUS EPIDERMIDIS  Final      Susceptibility   Staphylococcus epidermidis - MIC*    CIPROFLOXACIN <=0.5 SENSITIVE Sensitive     ERYTHROMYCIN >=8 RESISTANT Resistant     GENTAMICIN <=0.5 SENSITIVE Sensitive     OXACILLIN <=0.25 SENSITIVE Sensitive     TETRACYCLINE >=16 RESISTANT Resistant     VANCOMYCIN 1 SENSITIVE Sensitive     TRIMETH/SULFA <=10 SENSITIVE Sensitive     CLINDAMYCIN <=0.25 SENSITIVE Sensitive     RIFAMPIN <=0.5 SENSITIVE Sensitive     Inducible Clindamycin NEGATIVE Sensitive     * STAPHYLOCOCCUS EPIDERMIDIS  Blood Culture (routine x 2)     Status: Abnormal   Collection Time: 01/09/23  3:39 PM   Specimen: BLOOD RIGHT ARM  Result Value Ref Range Status   Specimen Description   Final    BLOOD RIGHT  ARM Performed at [facility], [address], [place], [place] [address]    Special Requests   Final    BOTTLES DRAWN AEROBIC AND ANAEROBIC Blood Culture results may not be optimal due to an excessive volume of blood received in culture bottles Performed at [facility], [address] [place], [place] [address]    Culture  Setup Time   Final    GRAM POSITIVE COCCI IN BOTH AEROBIC AND ANAEROBIC BOTTLES CRITICAL VALUE NOTED.  VALUE IS CONSISTENT WITH PREVIOUSLY REPORTED AND CALLED VALUE. Performed at [facility], [address] [place], [place] [address]    Culture STAPHYLOCOCCUS EPIDERMIDIS (A)  Final   Report Status 01/13/2023 FINAL  Final   Organism ID, Bacteria STAPHYLOCOCCUS EPIDERMIDIS  Final      Susceptibility   Staphylococcus epidermidis - MIC*    CIPROFLOXACIN <=0.5 SENSITIVE Sensitive     ERYTHROMYCIN >=8 RESISTANT Resistant     GENTAMICIN <=0.5 SENSITIVE Sensitive     OXACILLIN <=0.25 SENSITIVE Sensitive     TETRACYCLINE >=16 RESISTANT Resistant     VANCOMYCIN 1 SENSITIVE Sensitive     TRIMETH/SULFA <=10 SENSITIVE Sensitive     CLINDAMYCIN <=0.25 SENSITIVE Sensitive     RIFAMPIN <=0.5 SENSITIVE Sensitive     Inducible Clindamycin NEGATIVE Sensitive     * STAPHYLOCOCCUS EPIDERMIDIS  Resp panel by RT-PCR (RSV, Flu A&B, Covid)     Status: None   Collection Time: 01/09/23  3:39 PM   Specimen: Nasal Swab  Result Value Ref Range Status   SARS Coronavirus 2 by RT PCR NEGATIVE NEGATIVE Final    Comment: (NOTE) SARS-CoV-2 target nucleic acids are NOT DETECTED.  The SARS-CoV-2 RNA is generally detectable in upper respiratory specimens during the acute phase of infection. The lowest concentration of SARS-CoV-2 viral copies this assay can detect is 138 copies/mL. A negative result does not preclude SARS-Cov-2 infection and should not be used as the sole basis for treatment or other patient management decisions. A negative result may occur  with  improper specimen collection/handling, submission of specimen other than nasopharyngeal swab, presence of viral mutation(s) within the areas targeted by this assay, and inadequate number of viral copies(<138 copies/mL). A negative result must be combined with clinical observations, patient history, and epidemiological information. The expected result is Negative.  Fact Sheet for Patients:  BloggerCourse.com  Fact Sheet for Healthcare Providers:  SeriousBroker.it  This test is no t yet approved or cleared by the [place] FDA and  has been authorized for detection and/or diagnosis of SARS-CoV-2 by  FDA under an Emergency Use Authorization (EUA). This EUA will remain  in effect (meaning this test can be used) for the duration of the COVID-19 declaration under Section 564(b)(1) of the Act, 21 U.S.C.section 360bbb-3(b)(1), unless the authorization is terminated  or revoked sooner.       Influenza A by PCR NEGATIVE NEGATIVE Final   Influenza B by PCR NEGATIVE NEGATIVE Final    Comment: (NOTE) The Xpert Xpress SARS-CoV-2/FLU/RSV plus assay is intended as an aid in the diagnosis of influenza from Nasopharyngeal swab specimens and should not be used as a sole basis for treatment. Nasal washings and aspirates are unacceptable for Xpert Xpress SARS-CoV-2/FLU/RSV testing.  Fact Sheet for Patients: BloggerCourse.com  Fact Sheet for Healthcare Providers: SeriousBroker.it  This test is not yet approved or cleared by the [place] FDA and has been authorized for detection and/or diagnosis of SARS-CoV-2 by FDA under an Emergency Use Authorization (EUA). This EUA will remain in effect (meaning this test can be used) for the duration of the COVID-19 declaration under Section 564(b)(1) of the Act, 21 U.S.C. section 360bbb-3(b)(1), unless the authorization is terminated  or revoked.     Resp Syncytial Virus by PCR NEGATIVE NEGATIVE Final    Comment: (NOTE) Fact Sheet for Patients: BloggerCourse.com  Fact Sheet for Healthcare Providers: SeriousBroker.it  This test is not yet approved or cleared by the [place] FDA and has been authorized for detection and/or diagnosis of SARS-CoV-2 by FDA under an Emergency Use Authorization (EUA). This EUA will remain in effect (meaning this test can be used) for the duration of the COVID-19 declaration under Section 564(b)(1) of the Act, 21 U.S.C. section 360bbb-3(b)(1), unless the authorization is terminated or revoked.  Performed at [facility] [address] [place], [place] [address]   Blood Culture ID Panel (Reflexed)     Status: Abnormal   Collection Time: 01/09/23  3:39 PM  Result Value Ref Range Status   Enterococcus faecalis NOT DETECTED NOT DETECTED Final   Enterococcus Faecium NOT DETECTED NOT DETECTED Final   Listeria monocytogenes NOT DETECTED NOT DETECTED Final   Staphylococcus species DETECTED (A) NOT DETECTED Final    Comment: CRITICAL RESULT CALLED TO, READ BACK BY AND VERIFIED WITH: PHARMD CARLA JARDIN [id number] AT 0858 BY EC    Staphylococcus aureus (BCID) NOT DETECTED NOT DETECTED Final   Staphylococcus epidermidis DETECTED (A) NOT DETECTED Final    Comment: CRITICAL RESULT CALLED TO, READ BACK BY AND VERIFIED WITH: PHARMD CARLA JARDIN [id number] AT 0858 BY EC    Staphylococcus lugdunensis NOT DETECTED NOT DETECTED Final   Streptococcus species NOT DETECTED NOT DETECTED Final   Streptococcus agalactiae NOT DETECTED NOT DETECTED Final   Streptococcus pneumoniae NOT DETECTED NOT DETECTED Final   Streptococcus pyogenes NOT DETECTED NOT DETECTED Final   A.calcoaceticus-baumannii NOT DETECTED NOT DETECTED Final   Bacteroides fragilis NOT DETECTED NOT DETECTED Final   Enterobacterales NOT DETECTED NOT DETECTED Final    Enterobacter cloacae complex NOT DETECTED NOT DETECTED Final   Escherichia coli NOT DETECTED NOT DETECTED Final   Klebsiella aerogenes NOT DETECTED NOT DETECTED Final   Klebsiella oxytoca NOT DETECTED NOT DETECTED Final   Klebsiella pneumoniae NOT DETECTED NOT DETECTED Final   Proteus species NOT DETECTED NOT DETECTED Final   Salmonella species NOT DETECTED NOT DETECTED Final   Serratia marcescens NOT DETECTED NOT DETECTED Final   Haemophilus influenzae NOT DETECTED NOT DETECTED Final   Neisseria meningitidis NOT DETECTED NOT DETECTED Final   Pseudomonas  aeruginosa NOT DETECTED NOT DETECTED Final   Stenotrophomonas maltophilia NOT DETECTED NOT DETECTED Final   Candida albicans NOT DETECTED NOT DETECTED Final   Candida auris NOT DETECTED NOT DETECTED Final   Candida glabrata NOT DETECTED NOT DETECTED Final   Candida krusei NOT DETECTED NOT DETECTED Final   Candida parapsilosis NOT DETECTED NOT DETECTED Final   Candida tropicalis NOT DETECTED NOT DETECTED Final   Cryptococcus neoformans/gattii NOT DETECTED NOT DETECTED Final   Methicillin resistance mecA/C NOT DETECTED NOT DETECTED Final    Comment: Performed at [facility], [address] [place], [place] [address]  Culture, blood (Routine X 2) w Reflex to ID Panel     Status: None   Collection Time: 01/11/23  6:11 AM   Specimen: BLOOD  Result Value Ref Range Status   Specimen Description BLOOD BLOOD RIGHT ARM  Final   Special Requests   Final    BOTTLES DRAWN AEROBIC AND ANAEROBIC Blood Culture adequate volume   Culture   Final    NO GROWTH 5 DAYS Performed at [facility], [address], [place], [place] [address]    Report Status 01/16/2023 FINAL  Final  Culture, blood (Routine X 2) w Reflex to ID Panel     Status: None   Collection Time: 01/11/23  6:17 AM   Specimen: BLOOD  Result Value Ref Range Status   Specimen Description BLOOD BLOOD RIGHT HAND  Final   Special Requests   Final    BOTTLES DRAWN AEROBIC AND  ANAEROBIC Blood Culture adequate volume   Culture   Final    NO GROWTH 5 DAYS Performed at [facility], [address], [place], [place] [address]    Report Status 01/16/2023 FINAL  Final   Pertinent Lab seen by me:    Latest Ref Rng & Units 01/18/2023    2:36 AM 01/17/2023    4:56 PM 01/17/2023    4:43 PM  CBC  WBC 4.0 - 10.5 K/uL 36.0   31.1   Hemoglobin 13.0 - 17.0 g/dL 8.5  30.8  9.0   Hematocrit 39.0 - 52.0 % 25.8  30.0  27.8   Platelets 150 - 400 K/uL 239   275       Latest Ref Rng & Units 01/18/2023    2:36 AM 01/17/2023    4:56 PM 01/17/2023    4:43 PM  CMP  Glucose 70 - 99 mg/dL 87  81  84   BUN 6 - 20 mg/dL 657  846  962   Creatinine 0.61 - 1.24 mg/dL 9.52  8.41  3.24   Sodium 135 - 145 mmol/L 135  134  134   Potassium 3.5 - 5.1 mmol/L 4.6  4.7  4.6   Chloride 98 - 111 mmol/L 98  99  98   CO2 22 - 32 mmol/L 22   23   Calcium 8.9 - 10.3 mg/dL 9.1   9.5   Total Protein 6.5 - 8.1 g/dL   6.2   Total Bilirubin 0.3 - 1.2 mg/dL   0.8   Alkaline Phos 38 - 126 U/L   58   AST 15 - 41 U/L   97   ALT 0 - 44 U/L   <5      Pertinent Imagings/Other Imagings Plain films and CT images have been personally visualized and interpreted; radiology reports have been reviewed. Decision making incorporated into the Impression / Recommendations.  DG Chest [facility] [address]  Result Date: 01/17/2023 CLINICAL DATA:  Shortness of breath. Left chest pain and mid upper back pain for 1 week. EXAM: PORTABLE CHEST 1 VIEW COMPARISON:  Radiographs 01/16/2023 and 01/09/2023. Chest CTA 01/16/2023. FINDINGS: 1711 hours. There are extensive postsurgical changes from previous thoracic aorta stent grafting and aortic valve replacement. The heart size and mediastinal contours are stable. Stable mild scarring at the left lung base. The lungs otherwise appear clear. There is no pleural effusion or pneumothorax. Loop recorder and telemetry leads are noted. The bones appear unchanged. IMPRESSION: No evidence of  acute cardiopulmonary process. Stable postsurgical changes. Electronically Signed   By: Carey Bullocks M.D.   On: 01/17/2023 17:30   CT Angio Chest/Abd/Pel for Dissection W and/or Wo Contrast  Result Date: 01/16/2023 CLINICAL DATA:  Acute aortic syndrome. EXAM: CT ANGIOGRAPHY CHEST, ABDOMEN AND PELVIS TECHNIQUE: Non-contrast CT of the chest was initially obtained. Multidetector CT imaging through the chest, abdomen and pelvis was performed using the standard protocol during bolus administration of intravenous contrast. Multiplanar reconstructed images and MIPs were obtained and reviewed to evaluate the vascular anatomy. RADIATION DOSE REDUCTION: This exam was performed according to the departmental dose-optimization program which includes automated exposure control, adjustment of the mA and/or kV according to patient size and/or use of iterative reconstruction technique. CONTRAST:  OMNIPAQUE IOHEXOL 350 MG/ML SOLN COMPARISON:  01/09/2023.  04/27/2022 FINDINGS: CTA CHEST FINDINGS Cardiovascular: The heart is enlarged. No substantial pericardial effusion. Status post aortic valve replacement. Endograft identified in the thoracic aorta, similar to prior studies and patent. Postoperative change from debranching noted with patency of the branch vessel anatomy. The lumen of the left brachiocephalic vein stent opacifies consistent with patency. Enlargement of the pulmonary outflow tract/main pulmonary arteries suggests pulmonary arterial hypertension. No large central pulmonary embolus. Mediastinum/Nodes: No mediastinal lymphadenopathy. There is no hilar lymphadenopathy. The esophagus has normal imaging features. There is no axillary lymphadenopathy. Lungs/Pleura: Subtle changes of centrilobular and paraseptal emphysema noted. Dependent atelectasis. No pleural effusion. Musculoskeletal: No worrisome lytic or sclerotic osseous abnormality. Review of the MIP images confirms the above findings. CTA ABDOMEN AND  PELVIS FINDINGS VASCULAR Aorta: Postsurgical changes noted in the abdominal aorta, distal to the stent graft. Aneurysmal dilatation of the distal abdominal aorta measured previously at 3.2 cm is stable at 3.2 cm today. Fluid collection adjacent to the operative site described previously is stable, measuring 3.7 x 3.4 cm today compared to 3.7 x 3.5 cm previously. Celiac axis, and SMA are widely patent. Origin of the left renal artery not well demonstrated and similar to prior although left renal artery does opacify. Right renal artery appears widely patent. Celiac: Postsurgical change. As before, origin appears somewhat patulous without evidence for flow limiting stenosis. SMA: Surgical changes similar to prior with patulous origin. No features to suggest flow limiting stenosis. Renals: Stable.  Both renal arteries opacified, similar to prior. IMA: IMA does opacify. Inflow: Interval small dilatation of both common iliac arteries, similar to prior. Right common iliac artery measures 2.0 cm diameter compared to 2.2 cm previously. 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 Atherosclerotic disease characterized as both external iliac arteries, patent bilaterally. Internal iliac arteries are patent bilaterally. Veins: No obvious venous abnormality within the limitations of this arterial phase study. Review of the MIP images confirms the above findings. NON-VASCULAR Hepatobiliary: Hypodensity in the right liver is too small to characterize but statistically most likely benign. Layering tiny calcified gallstones evident (167/24). No intrahepatic or  extrahepatic biliary dilation. Pancreas: No focal mass lesion. No dilatation of the main duct. No intraparenchymal cyst. No peripancreatic edema. Spleen: No splenomegaly. No suspicious focal mass lesion. Adrenals/Urinary Tract: No adrenal nodule or mass. No hydronephrosis or overtly suspicious renal mass. No hydroureter. The  urinary bladder appears normal for the degree of distention. Stomach/Bowel: Stomach is unremarkable. No gastric wall thickening. No evidence of outlet obstruction. Duodenum is normally positioned as is the ligament of Treitz. No small bowel wall thickening. No small bowel dilatation. Moderate to large stool volume. Lymphatic: No discernible lymphadenopathy in the abdomen or pelvis. Reproductive: Prostate gland is enlarged. Other: None. Musculoskeletal: No worrisome lytic or sclerotic osseous abnormality. Review of the MIP images confirms the above findings. IMPRESSION: 1. Stable exam. No new or acute findings in the chest, abdomen, or pelvis. 2. Stable postsurgical changes in the thoracic and abdominal aorta. Stable aneurysmal dilatation of the distal abdominal aorta measuring 3.2 cm diameter. Stable fluid collection adjacent to the operative site in the abdominal aorta. 3. 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 4. Enlargement of the pulmonary outflow tract/main pulmonary arteries suggests pulmonary arterial hypertension. 5. Cholelithiasis. 6. Prostatomegaly. 7. Aortic Atherosclerosis (ICD10-I70.0) and Emphysema (ICD10-J43.9). Electronically Signed   By: Kennith Center M.D.   On: 01/16/2023 10:11   DG Chest 2 View  Result Date: 01/16/2023 CLINICAL DATA:  Left-sided chest pain. EXAM: CHEST - 2 VIEW COMPARISON:  01/09/2023 FINDINGS: The lungs are clear without focal pneumonia, edema, pneumothorax or pleural effusion. The cardiopericardial silhouette is within normal limits for size. Thoracic aortic stent graft again noted. No acute bony abnormality. Telemetry leads overlie the chest. IMPRESSION: No active cardiopulmonary disease. Electronically Signed   By: Kennith Center M.D.   On: 01/16/2023 07:17   ECHO TEE  Result Date: 01/13/2023    TRANSESOPHOGEAL ECHO REPORT   Patient Name:   [name] Date of Exam:  01/13/2023 Medical Rec #:  [id number]     Height:       71.0 in Accession #:    [id number]    Weight:       144.8 lb Date of Birth:  03/05/1971      BSA:          1.838 m Patient Age:    52 years      BP:           150/81 mmHg Patient Gender: M             HR:           92 bpm. Exam Location:  Inpatient Procedure: Transesophageal Echo, Color Doppler and Cardiac Doppler Indications:     Bacteremia  History:         Patient has prior history of Echocardiogram examinations, most                  recent 01/10/2023. CHF, CAD, Prior Cardiac Surgery, COPD and                  ESRD, h/o AVR with a 27mm Pericard Mitroflow bioprosthetic and                  subsequent total arch replacement in 2016 at [facility],                  Signs/Symptoms:Bacteremia; Risk Factors:Current Smoker,  Dyslipidemia, Hypertension and ETOH Abuse.                  Aortic Valve: 27 mm Mitroflow pericardial valve is present in                  the aortic position.  Sonographer:     Milbert Coulter Referring Phys:  [id number] [facility] Diagnosing Phys: Armanda Magic MD PROCEDURE: After discussion of the risks and benefits of a TEE, an informed consent was obtained from the patient. The transesophogeal probe was passed without difficulty through the esophogus of the patient. Imaged were obtained with the patient in a left lateral decubitus position. Sedation performed by different physician. The patient was monitored while under deep sedation. Anesthestetic sedation was provided intravenously by Anesthesiology: 144.98mg  of Propofol, 100mg  of Lidocaine. Image quality was good. The patient's vital signs; including heart rate, blood pressure, and oxygen saturation; remained stable throughout the procedure. The patient developed no complications during the procedure.  IMPRESSIONS  1. Left ventricular ejection fraction, by estimation, is 60 to 65%. The left ventricle has normal function. The left ventricle has no regional wall motion abnormalities.  There is severe concentric left ventricular hypertrophy.  2. Right ventricular systolic function is normal. The right ventricular size is normal.  3. Left atrial size was severely dilated. No left atrial/left atrial appendage thrombus was detected.  4. Right atrial size was moderately dilated.  5. The mitral valve is normal in structure. Moderate mitral valve regurgitation. No evidence of mitral stenosis.  6. The aortic valve has been repaired/replaced. There is moderate calcification of the aortic valve prosthesis. There is moderate thickening of the aortic valve prosthesis. Aortic valve regurgitation is trivial.. There is a 27 mm Mitroflow pericardial valve present in the aortic position. The AVR is not well visualized but there is at least moderate degeneration of the valve with at least moderate aortic stenosis by visualization.  7. The inferior vena cava is normal in size with greater than 50% respiratory variability, suggesting right atrial pressure of 3 mmHg.  8. Aortic root/ascending aorta has been repaired/replaced. Conclusion(s)/Recommendation(s): Normal biventricular function with no obvious vegetations. There is at least moderate stenosis of the AV prosthesis. FINDINGS  Left Ventricle: Left ventricular ejection fraction, by estimation, is 60 to 65%. The left ventricle has normal function. The left ventricle has no regional wall motion abnormalities. The left ventricular internal cavity size was normal in size. There is  severe concentric left ventricular hypertrophy. Right Ventricle: The right ventricular size is normal. No increase in right ventricular wall thickness. Right ventricular systolic function is normal. Left Atrium: Left atrial size was severely dilated. Spontaneous echo contrast was present in the left atrium. No left atrial/left atrial appendage thrombus was detected. Right Atrium: Right atrial size was moderately dilated. Pericardium: There is no evidence of pericardial effusion. Mitral  Valve: The mitral valve is normal in structure. Moderate mitral valve regurgitation, with centrally-directed jet. No evidence of mitral valve stenosis. Tricuspid Valve: The tricuspid valve is normal in structure. Tricuspid valve regurgitation is mild . No evidence of tricuspid stenosis. Aortic Valve: The aortic valve has been repaired/replaced. There is moderate calcification of the aortic valve. There is moderate thickening of the aortic valve. Aortic valve regurgitation is trivial. No aortic stenosis is present. There is a 27 mm Mitroflow pericardial valve present in the aortic position. Pulmonic Valve: The pulmonic valve was normal in structure. Pulmonic valve regurgitation is trivial. No evidence of pulmonic stenosis. Aorta: The  aortic root/ascending aorta has been repaired/replaced. Venous: The inferior vena cava is normal in size with greater than 50% respiratory variability, suggesting right atrial pressure of 3 mmHg. IAS/Shunts: No atrial level shunt detected by color flow Doppler. Additional Comments: Spectral Doppler performed. Armanda Magic MD Electronically signed by Armanda Magic MD Signature Date/Time: 01/13/2023/11:14:53 AM    Final    EP STUDY  Result Date: 01/13/2023 See surgical note for result.  MR LUMBAR SPINE WO CONTRAST  Result Date: 01/11/2023 CLINICAL DATA:  infection EXAM: MRI LUMBAR SPINE WITHOUT CONTRAST TECHNIQUE: Multiplanar, multisequence MR imaging of the lumbar spine was performed. No intravenous contrast was administered. COMPARISON:  None Available. FINDINGS: Segmentation:  Standard. Alignment:  Physiologic. Vertebrae: No abnormal bone marrow edema to suggest discitis/osteomyelitis. Diffusely low STIR and T2 signal is likely related to patient's iron injection. Conus medullaris and cauda equina: Conus extends to the T12 level. Conus and cauda equina appear normal. Paraspinal and other soft tissues: No appreciable paraspinal edema. Known infrarenal abdominal aortic aneurysm  better characterized on recent CT of the chest/abdomen/pelvis. Disc levels: T12-L1: No significant disc protrusion, foraminal stenosis, or canal stenosis. L1-L2: No significant disc protrusion, foraminal stenosis, or canal stenosis. L2-L3: No significant disc protrusion, foraminal stenosis, or canal stenosis. L3-L4: Mild disc height loss and desiccation. No significant disc protrusion, foraminal stenosis, or canal stenosis. L4-L5: Mild disc height loss and desiccation. Small disc bulge and annular fissure. No significant disc protrusion, foraminal stenosis, or canal stenosis. L5-S1: Mild disc height loss and desiccation. No significant disc protrusion, foraminal stenosis, or canal stenosis. IMPRESSION: 1. No obvious evidence of discitis/osteomyelitis. Postcontrast imaging could provide more sensitive evaluation if clinically warranted. 2. Mild lower lumbar degenerative change without significant stenosis. 3. Abnormal bone marrow signal diffusely is likely related to the patient's iron infusions. 4. Known infrarenal abdominal aortic aneurysm better characterized on recent CT of the chest/abdomen/pelvis. Electronically Signed   By: Feliberto Harts M.D.   On: 01/11/2023 17:47   ECHOCARDIOGRAM COMPLETE  Result Date: 01/10/2023    ECHOCARDIOGRAM REPORT   Patient Name:   [name] Date of Exam: 01/10/2023 Medical Rec #:  [id number]     Height:       71.0 in Accession #:    [id number]    Weight:       132.0 lb Date of Birth:  [birth year]      BSA:          1.767 m Patient Age:    52 years      BP:           143/78 mmHg Patient Gender: M             HR:           86 bpm. Exam Location:  Inpatient Procedure: 2D Echo, Cardiac Doppler and Color Doppler Indications:     CHF-Acute Diastolic I50.31  History:         Patient has prior history of Echocardiogram examinations, most                  recent 04/15/2022. Cardiomyopathy, CAD and Previous Myocardial                  Infarction, ETOH abuse, Stroke and COPD, Aortic  Valve Disease                  and Mitral Valve Disease; Risk Factors:Current Smoker,  Hypertension and Dyslipidemia. Aortic dissection repair.  Sonographer:     Dondra Prader RVT RCS Referring Phys:  [id number] [facility] Diagnosing Phys: Lennie Odor MD IMPRESSIONS  1. Left ventricular ejection fraction, by estimation, is 55 to 60%. The left ventricle has normal function. The left ventricle has no regional wall motion abnormalities. There is severe concentric left ventricular hypertrophy. Left ventricular diastolic  parameters are consistent with Grade II diastolic dysfunction (pseudonormalization).  2. Right ventricular systolic function is normal. The right ventricular size is normal. Tricuspid regurgitation signal is inadequate for assessing PA pressure.  3. Left atrial size was severely dilated.  4. Right atrial size was mild to moderately dilated.  5. The mitral valve is grossly normal. Moderate mitral valve regurgitation. No evidence of mitral stenosis.  6. Bioprosthetic AoV. Vmax 3.5 m/s, MG 31 mmHG, EOA 0.90cm2, DI 0.26, AT 120 msec. 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 The aortic valve has been repaired/replaced. Aortic valve regurgitation is not visualized. Severe aortic valve stenosis. Procedure Date: 11/2014. Echo findings are consistent with stenosis of the aortic prosthesis. Aortic valve acceleration time measures 120 msec.  7. Aortic root repair measures 38 mm. Aortic root/ascending aorta has been repaired/replaced.  8. The inferior vena cava is normal in size with greater than 50% respiratory variability, suggesting right atrial pressure of 3 mmHg. Comparison(s): Changes from prior study are noted. Aortic prosthetic valve with concerns for severe stenosis on this study. FINDINGS  Left Ventricle: Left ventricular ejection fraction, by estimation, is 55 to 60%. The left ventricle has normal function. The left ventricle has no regional wall motion  abnormalities. The left ventricular internal cavity size was normal in size. There is  severe concentric left ventricular hypertrophy. Abnormal (paradoxical) septal motion consistent with post-operative status. Left ventricular diastolic parameters are consistent with Grade II diastolic dysfunction (pseudonormalization). Right Ventricle: The right ventricular size is normal. No increase in right ventricular wall thickness. Right ventricular systolic function is normal. Tricuspid regurgitation signal is inadequate for assessing PA pressure. Left Atrium: Left atrial size was severely dilated. Right Atrium: Right atrial size was mild to moderately dilated. Pericardium: There is no evidence of pericardial effusion. Mitral Valve: The mitral valve is grossly normal. Moderate mitral valve regurgitation. No evidence of mitral valve stenosis. Tricuspid Valve: The tricuspid valve is grossly normal. Tricuspid valve regurgitation is mild . No evidence of tricuspid stenosis. Aortic Valve: Bioprosthetic AoV. Vmax 3.5 m/s, MG 31 mmHG, EOA 0.90cm2, DI 0.26, AT 120 msec. 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 The aortic valve has been repaired/replaced. Aortic valve regurgitation is not visualized. Severe aortic stenosis is present. Aortic valve mean gradient measures 31.2 mmHg. Aortic valve peak gradient measures 50.6 mmHg. Aortic valve area, by VTI measures 0.90 cm. There is a unknown bioprosthetic valve present in the aortic position. Pulmonic Valve: The pulmonic valve was grossly normal. Pulmonic valve regurgitation is not visualized. No evidence of pulmonic stenosis. Aorta: Aortic root repair measures 38 mm. The aortic root/ascending aorta has been repaired/replaced. Venous: The inferior vena cava is normal in size with greater than 50% respiratory variability, suggesting right atrial pressure of 3 mmHg. IAS/Shunts: The atrial septum is grossly normal.  LEFT VENTRICLE PLAX 2D LVIDd:         5.10 cm    Diastology LVIDs:         3.50 cm   LV e' medial:    5.98 cm/s LV PW:  1.90 cm   LV E/e' medial:  13.5 LV IVS:        1.70 cm   LV e' lateral:   7.51 cm/s LVOT diam:     2.10 cm   LV E/e' lateral: 10.7 LV SV:         69 LV SV Index:   39 LVOT Area:     3.46 cm  RIGHT VENTRICLE             IVC RV Basal diam:  3.20 cm     IVC diam: 1.00 cm RV S prime:     12.90 cm/s TAPSE (M-mode): 2.3 cm LEFT ATRIUM              Index        RIGHT ATRIUM           Index LA Vol (A2C):   114.0 ml 64.51 ml/m  RA Area:     22.40 cm LA Vol (A4C):   89.7 ml  50.76 ml/m  RA Volume:   69.20 ml  39.16 ml/m LA Biplane Vol: 117.0 ml 66.21 ml/m  AORTIC VALVE                     PULMONIC VALVE AV Area (Vmax):    1.13 cm      PR End Diast Vel: 6.15 msec AV Area (Vmean):   1.12 cm AV Area (VTI):     0.90 cm AV Vmax:           355.73 cm/s AV Vmean:          238.367 cm/s AV VTI:            0.764 m AV Peak Grad:      50.6 mmHg AV Mean Grad:      31.2 mmHg LVOT Vmax:         116.00 cm/s LVOT Vmean:        76.900 cm/s LVOT VTI:          0.199 m LVOT/AV VTI ratio: 0.26  AORTA Ao Root diam: 3.80 cm MITRAL VALVE MV Area (PHT): 3.99 cm    SHUNTS MV Decel Time: 190 msec    Systemic VTI:  0.20 m MV E velocity: 80.50 cm/s  Systemic Diam: 2.10 cm MV A velocity: 77.20 cm/s MV E/A ratio:  1.04 Lennie Odor MD Electronically signed by Lennie Odor MD Signature Date/Time: 01/10/2023/10:15:25 AM    Final (Updated)    CT CHEST ABDOMEN PELVIS WO CONTRAST  Result Date: 01/09/2023 CLINICAL DATA:  Cough, weight loss. EXAM: CT CHEST, ABDOMEN AND PELVIS WITHOUT CONTRAST TECHNIQUE: Multidetector CT imaging of the chest, abdomen and pelvis was performed following the standard protocol without IV contrast. RADIATION DOSE REDUCTION: This exam was performed according to the departmental dose-optimization program which includes automated exposure control, adjustment of the mA and/or kV according to patient size and/or use of iterative reconstruction  technique. COMPARISON:  04/27/2022 FINDINGS: CT CHEST FINDINGS Cardiovascular: Interval left brachiocephalic vein stenting traversing the previously narrowed segment. Aortic stent again noted along with prosthetic aortic valve. Postoperative findings from debranching. Left anterior descending, right, and circumflex coronary artery atheromatous vascular calcification. Today's noncontrast exam does not assess patency. Mild cardiomegaly. Mediastinum/Nodes: Unremarkable Lungs/Pleura: Mild centrilobular emphysema. Mild scarring in the left lower lobe and lingula. Airway thickening is present, suggesting bronchitis or reactive airways disease. Musculoskeletal: Prior median sternotomy. Paucity of adipose tissues. CT ABDOMEN PELVIS FINDINGS Hepatobiliary: Stable 1 cm hypodense lesion in the  right hepatic lobe on image 59 series 12, probably a cyst. Similar small stable hypodense lesion in the left hepatic lobe measures 4 mm in diameter on image 11 series 2. No further imaging workup of these lesions is indicated. Small gallstones in the gallbladder as on image 39 series 2. Pancreas: Slightly indistinct margins due to paucity of abdominal adipose tissue. No specific abnormality observed. Spleen: Unremarkable Adrenals/Urinary Tract: Stable moderate atrophy of the left kidney mild atrophy of the right kidney. Adrenal glands unremarkable. Urinary bladder unremarkable. Stomach/Bowel: Paucity of intra-adipose tissue and lack of oral and IV contrast makes separation of bowel loops problematic. No dilated bowel. Vascular/Lymphatic: Calcifications and postoperative findings along the surgical bypass grafts of the celiac trunk, SMA, and renal arteries, roughly similar contours 2 the 04/27/2022 exam, and with re-demonstration the 3.8 cm density just below the operative site which may reflect a postoperative fluid collection. Infrarenal abdominal aortic aneurysm 3.2 cm in diameter on image 41 of series 2, similar to previous. Left  iliac artery caliber stable at 3.3 cm, right common iliac artery caliber stable at 2.1 cm. Reproductive: Unremarkable Other: No supplemental non-categorized findings. Musculoskeletal: Unremarkable IMPRESSION: 1. A definite cause for the patient's weight loss is not identified on today's noncontrast exam. 2. Airway thickening is present, suggesting bronchitis or reactive airways disease. This may be contributing to the patient's cough. 3. Mild centrilobular emphysema. 4. Interval left brachiocephalic vein stenting traversing the previously narrowed segment. 5. Stable contours of postoperative findings in the thoracic aorta with debranching, and generally stable appearance of the abdominal systemic arterial bypasses. Chronic mildly complex postoperative fluid collection below the postoperative site in the abdominal aorta. 6. Stable 3.2 cm infrarenal abdominal aortic aneurysm. Typical recommendation would be follow-up ultrasound every 3 years, but given the complexity of the postoperative vascular scenario in the chest and abdomen, more frequent surveillance may be indicated as per vascular surgical surveillance protocols. 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 7. Stable moderate atrophy of the left kidney and mild atrophy of the right kidney. 8. Cholelithiasis. 9. Paucity of intra-adipose tissue and lack of oral and IV contrast makes separation of bowel loops difficult. Aortic Atherosclerosis (ICD10-I70.0) and Emphysema (ICD10-J43.9). Electronically Signed   By: Gaylyn Rong M.D.   On: 01/09/2023 15:41   CT Head Wo Contrast  Result Date: 01/09/2023 CLINICAL DATA:  Headaches, increasing frequency or severity. Headache and bilateral arm pain following carotid artery stenting. EXAM: CT HEAD WITHOUT CONTRAST TECHNIQUE: Contiguous axial images were obtained from the base of the skull through the vertex without intravenous contrast. RADIATION DOSE REDUCTION: This exam was performed according to the  departmental dose-optimization program which includes automated exposure control, adjustment of the mA and/or kV according to patient size and/or use of iterative reconstruction technique. 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 No acute infarct, hemorrhage, or mass lesion is present. No significant white matter lesions are present. Deep brain nuclei are within normal limits. The ventricles are of normal size. No significant extraaxial fluid collection is present. Midline structures are within normal limits. The brainstem and cerebellum are within normal limits. Vascular: Extensive atherosclerotic calcifications are present in the cavernous internal carotid arteries bilaterally. Calcifications are present in left vertebral artery. 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 Skull: Calvarium is intact. No focal lytic or blastic lesions are present. No significant extracranial soft tissue lesion is  present. Sinuses/Orbits: The paranasal sinuses and mastoid air cells are clear. The globes and orbits are within normal limits. IMPRESSION: 1. No acute intracranial abnormality or significant interval change. 2. 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 3. 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 Electronically Signed   By: Marin Roberts M.D.   On: 01/09/2023 15:32   DG Chest Port 1 View  Result Date: 01/09/2023 CLINICAL DATA:  Questionable sepsis - evaluate for abnormality EXAM: PORTABLE CHEST 1 VIEW COMPARISON:  December 25, 2022 FINDINGS: The cardiomediastinal silhouette is unchanged in contour.Status post endovascular repair of the aorta. Status post median sternotomy. Cardiac loop recorder. No pleural effusion. No pneumothorax. No acute pleuroparenchymal abnormality. Surgical clips project over the  RIGHT axilla. IMPRESSION: No acute cardiopulmonary abnormality. Electronically Signed   By: Meda Klinefelter M.D.   On: 01/09/2023 14:39   PERIPHERAL VASCULAR CATHETERIZATION  Result Date: 12/28/2022 DATE OF SERVICE: 12/25/2022  PATIENT:  Diannia Ruder  52 y.o. male  PRE-OPERATIVE DIAGNOSIS:  ESRD; central venous stenosis  POST-OPERATIVE DIAGNOSIS:  Same  PROCEDURE:  Ultrasound guided left AVF access Left arm fistulagram Intravascular ultrasound of the superior vena cava, left brachiocephalic vein, left subclavian vein, left axillary vein Left brachiocephalic arteriovenous angioplasty and stenting (16x20mm Abre) Conscious sedation (53 minutes)  SURGEON:  Surgeons and Role:    * Leonie Douglas, MD - Primary  ASSISTANT: none  ANESTHESIA:   local and IV sedation  EBL: minimal  BLOOD ADMINISTERED:none  DRAINS: none  LOCAL MEDICATIONS USED:  NONE  SPECIMEN:  none  COUNTS: confirmed correct.  TOURNIQUET:  none  PATIENT DISPOSITION:  PACU - hemodynamically stable.  Delay start of Pharmacological VTE agent (>24hrs) due to surgical blood loss or risk of bleeding: no  INDICATION FOR PROCEDURE: [name] is a 52 y.o. male with ESRD and central venous stenosis. After careful discussion of risks, benefits, and alternatives the patient was offered fistulagram. The patient understood and wished to proceed.  OPERATIVE FINDINGS: occluded left brachiocephalic vein. Successful recanalization of left brachiocephalic vein with angioplasty and stenting. Smooth thrill in fistula at completion.  DESCRIPTION OF PROCEDURE: After identification of the patient in the pre-operative holding area, the patient was transferred to the operating room. The patient was positioned supine on the operating room table. Anesthesia was induced. The left arm was prepped and draped in standard fashion. A surgical pause was performed confirming correct patient, procedure, and operative location.  Duplex ultrasound was used to access the left arm  brachiobasilic arteriovenous fistula.  Fistulogram was performed centrally.  This confirmed brachiocephalic vein occlusion.  I am suspicious that compression of the aorto brachiocephalic ranch bypass was causing compression.  I was able to traverse the occlusion fairly easily with a Glidewire advantage.  Intravascular ultrasound was performed of the superior vena cava, left brachiocephalic vein, left subclavian vein, and left axillary vein.  A 16 x 80 mm Opry stent was brought on the field and delivered across the lesion.  This was deployed with overlap into normal vein proximally distally.  Angioplasty with a 12 x 40 mm Atlas balloon was then performed.  Follow-up fistulogram showed resolution of occlusion and much improved flow through the chest.  All endovascular equipment was removed.  A pursestring was placed around the access and rendered hemostatic.  Upon completion of the case instrument and sharps counts were confirmed correct. The patient was transferred to the PACU in good  condition. I was present for all portions of the procedure.  FOLLOW UP PLAN: Assuming a normal postoperative course, I will see the patient in 2-3 weeks with AV fistula duplex.  Rande Brunt. Lenell Antu, MD [facility] Phone Number: [phone] 12/25/2022 9:11 AM   DG CHEST PORT 1 VIEW  Result Date: 12/25/2022 CLINICAL DATA:  Chest pain after procedure. EXAM: PORTABLE CHEST 1 VIEW COMPARISON:  December 21, 2022.  April 27, 2022. FINDINGS: Mild cardiomegaly is noted. Grossly stable appearance of thoracic aortic stent graft involving ascending and descending thoracic aorta. Sternotomy wires are noted. Interval placement new stent in expected position of left brachiocephalic vein. No pneumothorax or pleural effusion is noted. Lungs are clear. The thorax is unremarkable. IMPRESSION: Interval placement of new stent in expected position of left brachiocephalic vein. Thoracic stent graft noted on  prior exam is grossly unremarkable. Lungs are clear. Electronically Signed   By: Lupita Raider M.D.   On: 12/25/2022 11:43   DG Ribs Unilateral W/Chest Left  Result Date: 12/21/2022 CLINICAL DATA:  Pain. EXAM: LEFT RIBS AND CHEST - 3+ VIEW COMPARISON:  Chest radiograph dated April 27, 2022. FINDINGS: No acute osseous abnormality is identified. No obvious displaced rib fracture. There is no evidence of pneumothorax or pleural effusion. Mild scarring at the left lung base. Both lungs are otherwise clear. Heart size and mediastinal contours are unchanged with prior median sternotomy, aortic valve replacement, and thoracic aortic stent grafting. IMPRESSION: No displaced rib fracture identified. Electronically Signed   By: Hart Robinsons M.D.   On: 12/21/2022 13:23   MR BRAIN WO CONTRAST  Result Date: 12/20/2022 CLINICAL DATA:  Syncope EXAM: MRI HEAD WITHOUT CONTRAST TECHNIQUE: Multiplanar, multiecho pulse sequences of the brain and surrounding structures were obtained without intravenous contrast. COMPARISON:  None Available. FINDINGS: Brain: No acute infarct, mass effect or extra-axial collection. There is multifocal hyperintense T2-weighted signal within the white matter. Parenchymal volume and CSF spaces are normal. Old left parietal infarct. The midline structures are normal. Vascular: Vertebrobasilar dolichoectasia, unchanged. Flow voids are preserved. Skull and upper cervical spine: Normal calvarium and skull base. Visualized upper cervical spine and soft tissues are normal. Sinuses/Orbits:Trace left mastoid fluid. Mild bilateral maxillary sinus mucosal thickening. Normal orbits. IMPRESSION: 1. No acute intracranial abnormality. 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 3. Chronic small vessel ischemia. Electronically Signed   By: Deatra Robinson M.D.   On: 12/20/2022 02:02   CT ANGIO HEAD NECK W WO CM  Result Date: 12/19/2022 CLINICAL DATA:  Vertigo, central. EXAM: CT ANGIOGRAPHY HEAD AND NECK WITH AND  WITHOUT CONTRAST TECHNIQUE: Multidetector CT imaging of the head and neck was performed using the standard protocol during bolus administration of intravenous contrast. Multiplanar CT image reconstructions and MIPs were obtained to evaluate the vascular anatomy. Carotid stenosis measurements (when applicable) are obtained utilizing NASCET criteria, using the distal internal carotid diameter as the denominator. RADIATION DOSE REDUCTION: This exam was performed according to the departmental dose-optimization program which includes automated exposure control, adjustment of the mA and/or kV according to patient size and/or use of iterative reconstruction technique. CONTRAST:  75mL OMNIPAQUE IOHEXOL 350 MG/ML SOLN COMPARISON:  CT head without contrast 04/27/2022. MR head without contrast 03/03/2016. CT angio head 02/28/2016. FINDINGS: CT HEAD FINDINGS Brain: The remote left MCA territory infarct is again noted. A remote infarct of the right middle frontal gyrus is also stable. Periventricular white matter changes are present. No acute infarct, hemorrhage, or mass lesion is  present. The ventricles are of normal size. No significant extraaxial fluid collection is present. The brainstem is slightly displaced to the right secondary to enlarged and tortuous left vertebral artery. Brainstem and cerebellum are otherwise within. Vascular: Atherosclerotic calcifications are present the left vertebral artery and proximal basilar artery. Calcifications are also present within the cavernous internal carotid arteries bilaterally. No hyperdense vessel is present. Skull: Calvarium is intact. No focal lytic or blastic lesions are present. No significant extracranial soft tissue lesion is present. Sinuses/Orbits: A remote right medial orbital blowout fracture is stable. Paranasal sinuses and mastoid air cells are clear. The globes and orbits are within normal limits. Review of the MIP images confirms the above findings CTA NECK FINDINGS  Aortic arch: Aortic stent graft is in place. Great vessel origins are proximal to the graft. Right carotid system: Minimal atherosclerotic changes are present at the right carotid bifurcation without significant stenosis. Moderate tortuosity is present in the cervical right ICA without significant stenosis. Left carotid system: The left common carotid artery is within normal limits. Mild atherosclerotic changes are present at the bifurcation without significant stenosis. Moderate tortuosity is present cervical left ICA without significant stenosis. Vertebral arteries: The right vertebral artery is dominant vessel. Both vertebral arteries originate from the subclavian arteries without significant stenosis. The left vertebral artery is occluded proximally positive sore on enhances within and around the left foramina transversarium. A hypoplastic distal V3 segment is reconstituted at the level of C1-2. Skeleton: Mild degenerative changes are present in the cervical spine. No focal osseous lesions are present. Other neck: Soft tissues the neck are otherwise unremarkable. Salivary glands are within normal limits. Thyroid is normal. No significant adenopathy is present. No focal mucosal or submucosal lesions are present. Prominent venous engorgement is present within the left side of the neck. Upper chest: Centrilobular emphysematous changes are present. Mild dependent atelectasis is present bilaterally. Review of the MIP images confirms the above findings CTA HEAD FINDINGS Anterior circulation: Minimal atherosclerotic calcifications are present within the left cavernous internal carotid artery without significant stenosis. The internal carotid arteries are within normal limits from the skull base to the ICA termini otherwise. Focal fusiform dilation of the distal right M1 segment is noted. The left A1 segment is dominant. Both A2 segments fill from the left. The left M1 segment is normal. Is the ACA and MCA branch  vessels are normal bilaterally. Posterior circulation: The left vertebral artery is smaller at the dural margin than on the prior study. Fusiform dilation in the more distal V4 segment extending and proximal basilar artery has increased since the prior study. Basilar artery measures 8.5 mm maximally. The PICA origins are visualized and normal bilaterally. The right vertebral artery is within normal limits. The superior cerebellar arteries are patent. Both posterior cerebral arteries originate basilar tip. A high-grade stenosis is present in the distal right P2 segment. Irregularity of the PCA branch vessels is present bilaterally. Venous sinuses: The dural sinuses are patent. The straight sinus and deep cerebral veins are intact. Cortical veins are within normal limits. No significant vascular malformation is evident. Anatomic variants: None Review of the MIP images confirms the above findings IMPRESSION: 1. Stable remote left MCA territory infarct. 2. Stable remote infarct of the right middle frontal gyrus. 3. Stable atrophy and white matter disease. No acute intracranial abnormality. 4. 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 5. Fusiform dilation of the more V4 segment extending and proximal basilar artery has  increased since the prior study. 6. High-grade stenosis of the distal right P2 segment is stable. 7. Irregularity of the distal PCA branch vessels bilaterally. 8. Focal fusiform dilation of the distal right M1 segment has progressed, measuring up to 6 mm. 9. Aortic stent graft in place. 10. Prominent venous engorgement within the left side of the neck. 11.  Emphysema (ICD10-J43.9). Electronically Signed   By: Marin Roberts M.D.   On: 12/19/2022 12:53    I have personally spent 88 minutes involved in face-to-face and non-face-to-face activities for this patient on the day of the visit. Professional time spent includes the following activities:  Preparing to see the patient (review of tests), Obtaining and/or reviewing separately obtained history (admission/discharge record), Performing a medically appropriate examination and/or evaluation , Ordering medications/tests/procedures, referring and communicating with other health care professionals, Documenting clinical information in the EMR, Independently interpreting results (not separately reported), Communicating results to the patient/family/caregiver, Counseling and educating the patient/family/caregiver and Care coordination (not separately reported).  Electronically signed by:   Plan d/w requesting provider as well as ID pharm D  Of note, portions of this note may have been created with voice recognition software. While this note has been edited for accuracy, occasional wrong-word or 'sound-a-like' substitutions may have occurred due to the inherent limitations of voice recognition software.   Odette Fraction, MD Infectious Disease Physician [facility] Pager: [phone]

## 2023-01-18 NOTE — Consult Note (Signed)
Cardiology Consultation   Patient ID: KAMARII CARTON MRN: [id number]; DOB: 05/25/1970  Admit date: 01/17/2023 Date of Consult: 01/18/2023  PCP:  Loura Back, NP   [facility] Providers Cardiologist:  Marca Ancona, MD  Advanced Heart Failure:  Marca Ancona, MD     Patient Profile:   [name] is a 52 y.o. male with a hx of NICM, HTN, ETOH use, CAD s/p NSTEMI PCI to RI '19, ascending aortic dissection s/p Bentall with AVR and arch repair '16 (At [facility]) with staged endovascular repair of descending aortic aneurysm, ESRD on HD, thrombocytopenia, CVA who is being seen 01/18/2023 for the evaluation of chest pain/elevated troponin at the request of Dr. Lowell Guitar.  History of Present Illness:   [name] is a 52 year old with past medical history noted above.  He has been followed by Dr. Shirlee Latch in the advanced heart failure clinic.  He has a long history of nonischemic cardiomyopathy with chronic systolic heart failure secondary to hypertension and EtOH use.  He was admitted 04/2014 with hypertensive emergency and found to have tight BP aortic dissection just distal to the left subclavian.  Left renal artery was also involved with left renal infarct.  Underwent cardiac catheterization during that admission which showed 60% D1 lesion.  He underwent extensive intervention on his a ascending aorta and arch with AVR in 2016 with staged endovascular repair of descending aortic aneurysm.  She was admitted 11/2017 with chest pain and found to have a non-STEMI.  Underwent cardiac catheterization with PCI/DES to proximal ramus.  Had a repeat heart catheterization in 2020 which showed nonobstructive disease.  Underwent TEE 10/2020 which showed good AVR function and preserved EF of 60 to 65%.  Admitted 10/5 - 12/20/2022 with vertigo.  Evaluated by neurology with concerns for acute vestibular vertigo.  CTA of head and neck showed significant vascular abnormalities with severely distended left JVD.   Vascular surgery was consulted and left brachiocephalic stenosis for lesion was not described.  He subsequently underwent left arm fistulogram with left brachiocephalic arteriovenous angioplasty and stenting 12/25/2022.   Admitted 10/28 with chest pain and fever of unknown origin and found to be bacteremic with elevated troponin 300 range.  He reports noncompliance with his HD sessions only going once a week stating that he has proven his regimen works.  Echocardiogram during that admission showed severe stenosis aortic valve prosthesis.  Blood cultures were positive for coagulase-negative staphylococcus.  Initially severe thrombocytopenia precluded ability to undergo TEE.  He was cleared by hematology and underwent TEE with no vegetations visualized.  It was recommended that despite this he should undergo significant antibiotic duration due to his prosthetic valve for a total of 6 weeks per ID.  He was also noted to have abnormal thickening of the bioprosthetic aortic valve with recommendations that when he cleared from his been sections he could be considered for surgery.  In regards to his chest pain, it was recommended that he continue to improve from his infection and will be considered for diagnostic cardiac cath in the future if platelets remained stable.   Presented back to the ED 11/2 complaints of chest pain and shortness of breath. Reports he has been going to HD on TTS schedule getting his antibiotics, but normally would only go to HD once a week. Since his DC on 10/31 he has continued to have left sided chest/abd pain.   Labs on admission showed sodium 134, potassium 4.6, creatinine 0.9, albumin 2.9, AST 97, BNP 1461,  high-sensitivity troponin 1215>>1495>>1562>>1397, WBC 31, hemoglobin 9, lactic acid 0.7.  CT angio chest abdomen pelvis with no acute findings, stable aneurysmal dilatation of abdominal aorta 3.2cm, pulmonary arterial hypertension.  He was placed on IV nitroglycerin, heparin on  admission.  He had been on IV cefazolin at home, with rising WBC ID consulted per primary.  Transitioned to IV vancomycin and Rocephin.  Blood cultures pending. Cardiology asked to evaluate.   In talking with patient, he reports constant left sided chest/abd pain which is similar to what he experienced prior to and during his most recent admission. Only improves with morphine and dilaudid, oral meds not helping. Has been on IV NTG with no change in symptoms as well.     Past Medical History:  Diagnosis Date   Adenomatous colon polyp 08/2015   Anxiety    Aortic disease (HCC)    Aortic dissection (HCC)    a. admx 04/2014 >> L renal infarct; a/c renal failure >> b.  s/p Bioprosthetic Bentall and total arch replacement and staged endovascular repair of descending aortic aneurysm ([facility] - Dr. Kizzie Bane)   CAD (coronary artery disease)    a. LHC 4/16:  oD1 60%   Cardiomyopathy (HCC)    a. non-ischemic - probably related to untreated HTN and ETOH abuse - Echo 3/13 with EF 35-40% >> b. Echo 4/16: Severe LVH, EF 55-60%, moderate AI, moderate MR, mild LAE, trivial effusion, known type B dissection with communication between true and false lumens with suprasternal images suggesting dissection plane may propagate to at least left subclavian takeoff, root above aortic valve okay     Chronic abdominal pain    Chronic combined systolic and diastolic congestive heart failure (HCC)    a. 05/2011: Adm with pulm edema/HTN urgency, EF 35-40% with diffuse hypokinesis and moderate to severe mitral regurgitation. Cardiomyopathy likely due to uncontrolled HTN and ETOH abuse - cath deferred due to renal insufficiency (felt due to uncontrolled HTN). bElzie Rings MV 06/2011: EF 37% and no ischemia or infarction. c. EF 45-50% by echo 01/2012.   Chronic sinusitis    DDD (degenerative disc disease), lumbar    Depression    Descending thoracic aortic aneurysm (HCC)    Dissecting aneurysm of thoracic aorta (HCC)    ESRD (end stage  renal disease) (HCC)    on dialysis 1-2 days per week (2 days ordered, only coming 1 day per week as of oct 2024). on dialysis 3-4 years total   ETOH abuse    a. Reported to have quit 05/2011.   Frequent headaches    GERD (gastroesophageal reflux disease)    Headache(784.0)    "q other day" (08/08/2013)   Heart murmur    Hemorrhoid thrombosis    History of echocardiogram    Echo 1/17:  Severe LVH, EF 55-60%, no RWMA, Gr 2 DD, AVR ok, mild to mod MR, mild LAE, mild reduced RVSF, mod RAE   History of medication noncompliance    HYPERLIPIDEMIA    Hypertension    a. Hx of HTN urgency secondary to noncompliance. b. urinary metanephrine and catecholeamine levels normal 2013.  c. Renal art [name] 1/16:  No evidence of renal artery stenosis noted bilaterally.   INGUINAL HERNIA    Pneumonia ~ 2013   Serrated adenoma of colon 08/2015   Stroke [facility])    Tobacco abuse    Valvular heart disease    a. Echo 05/2011: moderate to severe eccentric MR and mild to moderate AI with prolapsing left coronary cusp.  b. Echo 01/2012: mild-mod AI, mild dilitation of aortic root, mild MR.;  c. Echo 1/16: Severe LVH consistent with hypertrophic cardio myopathy, EF 50%, no RWMA, mod AI, mild MR, mild RAE, dilated Ao root (40 mm);     Past Surgical History:  Procedure Laterality Date   A/V FISTULAGRAM Left 03/07/2021   Procedure: A/V FISTULAGRAM;  Surgeon: Chuck Hint, MD;  Location: [facility] INVASIVE CV LAB;  Service: Cardiovascular;  Laterality: Left;   A/V FISTULAGRAM Left 07/18/2021   Procedure: A/V Fistulagram;  Surgeon: Chuck Hint, MD;  Location: [facility] INVASIVE CV LAB;  Service: Cardiovascular;  Laterality: Left;   A/V FISTULAGRAM Left 12/25/2022   Procedure: A/V Fistulagram;  Surgeon: Leonie Douglas, MD;  Location: [facility] INVASIVE CV LAB;  Service: Cardiovascular;  Laterality: Left;   ANKLE SURGERY Bilateral    Fractures bilaterally   AORTIC VALVE SURGERY  09/2014   AV FISTULA PLACEMENT Left 09/09/2020    Procedure: LEFT ARM ARTERIOVENOUS (AV) FISTULA CREATION;  Surgeon: Leonie Douglas, MD;  Location: [facility] OR;  Service: Vascular;  Laterality: Left;   BASCILIC VEIN TRANSPOSITION Left 10/09/2020   Procedure: LEFT SECOND STAGE BASILIC VEIN TRANSPOSITION;  Surgeon: Leonie Douglas, MD;  Location[facility] OR;  Service: Vascular;  Laterality: Left;  PERIPHERAL NERVE BLOCK   CORONARY ANGIOGRAPHY N/A 12/06/2017   Procedure: CORONARY ANGIOGRAPHY;  Surgeon: Lyn Records, MD;  Location: [facility] INVASIVE CV LAB;  Service: Cardiovascular;  Laterality: N/A;   CORONARY ANGIOGRAPHY N/A 09/26/2018   Procedure: CORONARY ANGIOGRAPHY;  Surgeon: Yvonne Kendall, MD;  Location: [facility] INVASIVE CV LAB;  Service: Cardiovascular;  Laterality: N/A;   CORONARY STENT INTERVENTION N/A 12/10/2017   Procedure: CORONARY STENT INTERVENTION;  Surgeon: Lennette Bihari, MD;  Location: [facility] INVASIVE CV LAB;  Service: Cardiovascular;  Laterality: N/A;   FOOT FRACTURE SURGERY Bilateral 2004-2010   "got pins in both of them"   HEMORRHOID SURGERY N/A 06/15/2015   Procedure: HEMORRHOIDECTOMY;  Surgeon: Almond Lint, MD;  Location: [facility] OR;  Service: General;  Laterality: N/A;   INGUINAL HERNIA REPAIR Right ~ 1996   INSERTION OF DIALYSIS CATHETER N/A 09/09/2020   Procedure: INSERTION OF TUNNELED DIALYSIS PALINDROME 19cm CATHETER;  Surgeon: Leonie Douglas, MD;  Location: [facility] OR;  Service: Vascular;  Laterality: N/A;   LEFT HEART CATH AND CORONARY ANGIOGRAPHY N/A 02/05/2021   Procedure: LEFT HEART CATH AND CORONARY ANGIOGRAPHY;  Surgeon: Laurey Morale, MD;  Location: [facility] INVASIVE CV LAB;  Service: Cardiovascular;  Laterality: N/A;   LEFT HEART CATHETERIZATION WITH CORONARY ANGIOGRAM N/A 06/21/2014   Procedure: LEFT HEART CATHETERIZATION WITH CORONARY ANGIOGRAM;  Surgeon: Laurey Morale, MD;  Location: [facility] CATH LAB;  Service: Cardiovascular;  Laterality: N/A;   LOOP RECORDER INSERTION N/A 06/19/2016   Procedure: Loop Recorder Insertion;  Surgeon: [facility] Salvia, MD;  Location: [facility] INVASIVE CV LAB;  Service: Cardiovascular;  Laterality: N/A;   PERIPHERAL VASCULAR BALLOON ANGIOPLASTY  03/07/2021   Procedure: PERIPHERAL VASCULAR BALLOON ANGIOPLASTY;  Surgeon: Chuck Hint, MD;  Location: [facility] INVASIVE CV LAB;  Service: Cardiovascular;;   PERIPHERAL VASCULAR BALLOON ANGIOPLASTY  07/18/2021   Procedure: PERIPHERAL VASCULAR BALLOON ANGIOPLASTY;  Surgeon: Chuck Hint, MD;  Location: [facility] INVASIVE CV LAB;  Service: Cardiovascular;;   PERIPHERAL VASCULAR INTERVENTION Left 12/25/2022   Procedure: PERIPHERAL VASCULAR INTERVENTION;  Surgeon: Leonie Douglas, MD;  Location: [facility] INVASIVE CV LAB;  Service: Cardiovascular;  Laterality: Left;  stent to brachiocephalic vein   PERIPHERAL VASCULAR ULTRASOUND/IVUS Left 12/25/2022  Procedure: Peripheral Vascular Ultrasound/IVUS;  Surgeon: Leonie Douglas, MD;  Location: [facility] INVASIVE CV LAB;  Service: Cardiovascular;  Laterality: Left;   TEE WITHOUT CARDIOVERSION N/A 03/12/2016   Procedure: TRANSESOPHAGEAL ECHOCARDIOGRAM (TEE);  Surgeon: Thurmon Fair, MD;  Location: [facility] ENDOSCOPY;  Service: Cardiovascular;  Laterality: N/A;   TEE WITHOUT CARDIOVERSION N/A 10/16/2020   Procedure: TRANSESOPHAGEAL ECHOCARDIOGRAM (TEE);  Surgeon: Laurey Morale, MD;  Location: [facility] ENDOSCOPY;  Service: Cardiovascular;  Laterality: N/A;   TRANSESOPHAGEAL ECHOCARDIOGRAM (CATH LAB) N/A 01/13/2023   Procedure: TRANSESOPHAGEAL ECHOCARDIOGRAM;  Surgeon: Quintella Reichert, MD;  Location: [facility] INVASIVE CV LAB;  Service: Cardiovascular;  Laterality: N/A;   UPPER EXTREMITY VENOGRAPHY Right 07/18/2021   Procedure: UPPER EXTREMITY VENOGRAPHY;  Surgeon: Chuck Hint, MD;  Location: [facility] INVASIVE CV LAB;  Service: Cardiovascular;  Laterality: Right;     Inpatient Medications: Scheduled Meds:  amLODipine  10 mg Oral Daily   arformoterol  15 mcg Nebulization BID   And   umeclidinium bromide  1 puff Inhalation Daily   aspirin EC  81 mg  Oral Daily   feeding supplement (NEPRO CARB STEADY)  237 mL Oral BID BM   hydrALAZINE  50 mg Oral TID   Continuous Infusions:  cefTRIAXone (ROCEPHIN)  IV     heparin 1,150 Units/hr (01/18/23 0738)   nitroGLYCERIN 35 mcg/min (01/18/23 1110)   [START ON 01/19/2023] vancomycin     PRN Meds: acetaminophen **OR** acetaminophen, HYDROmorphone (DILAUDID) injection, meclizine, ondansetron **OR** ondansetron (ZOFRAN) IV, oxyCODONE, senna-docusate  Allergies:    Allergies  Allergen Reactions   Clonidine Derivatives Nausea And Vomiting and Other (See Comments)    Tablets by mouth = Tolerated  Patches on the skin = Nausea & vomiting after wearing 4-5 days    Imdur [Isosorbide Nitrate] Other (See Comments)    Headaches -- patient instructed to continue taking    Tramadol Other (See Comments)    Headaches     Social History:   Social History   Socioeconomic History   Marital status: Married    Spouse name: Not on file   Number of children: 5   Years of education: 12   Highest education level: Not on file  Occupational History   Occupation: Disabled, part-time Corporate investment banker    Comment: Disability  Tobacco Use   Smoking status: Some Days    Current packs/day: 0.25    Average packs/day: 0.3 packs/day for 23.0 years (5.8 ttl pk-yrs)    Types: Cigarettes    Passive exposure: Current   Smokeless tobacco: Never   Tobacco comments:    3 to 4 per day  Vaping Use   Vaping status: Not on file  Substance and Sexual Activity   Alcohol use: Not Currently    Alcohol/week: 1.0 standard drink of alcohol    Types: 1 Cans of beer per week    Comment: On special occasion   Drug use: Yes    Types: Marijuana    Comment: 2 times/week - last use 10/06/20   Sexual activity: Yes  Other Topics Concern   Not on file  Social History Narrative   Fun: Enjoy his children   Social Determinants of Health   Financial Resource Strain: Low Risk  (07/07/2022)   [facility]   Overall Financial Resource Strain (CARDIA)    Difficulty of Paying Living Expenses: Not hard at all  Food Insecurity: No Food Insecurity (01/17/2023)   Hunger Vital Sign    Worried About Running Out  of Food in the Last Year: Never true    Ran Out of Food in the Last Year: Never true  Transportation Needs: No Transportation Needs (01/17/2023)   PRAPARE - Administrator, Civil Service (Medical): No    Lack of Transportation (Non-Medical): No  Physical Activity: Not on file  Stress: Not on file  Social Connections: Unknown (05/11/2022)   Received from [facility]   Social Network    Social Network: Not on file  Intimate Partner Violence: Not At Risk (01/17/2023)   Humiliation, Afraid, Rape, and Kick questionnaire    Fear of Current or Ex-Partner: No    Emotionally Abused: No    Physically Abused: No    Sexually Abused: No    Family History:    Family History  Problem Relation Age of Onset   Hypertension Mother    Hypertension Other    Colon cancer Paternal Uncle    Stroke Maternal Aunt    Heart attack Brother    Hypertension Brother    Diabetes Maternal Aunt    Lung cancer Maternal Uncle    Stroke Maternal Uncle    Other Sister        "breathing machine at night"   Headache Sister    Thyroid disease Sister    Throat cancer Neg Hx    Pancreatic cancer Neg Hx    Esophageal cancer Neg Hx    Kidney disease Neg Hx    Liver disease Neg Hx      ROS:  Please see the history of present illness.   All other ROS reviewed and negative.     Physical Exam/Data:   Vitals:   01/18/23 0130 01/18/23 0417 01/18/23 0755 01/18/23 0907  BP: (!) 157/88 (!) 155/90  (!) 155/90  Pulse:  (!) 106 (!) 107   Resp:  20 17   Temp:  100 F (37.8 C)    TempSrc:  Oral    SpO2:   96%   Weight:      Height:        Intake/Output Summary (Last 24 hours) at 01/18/2023 1228 Last data filed at 01/18/2023 1610 Gross per 24 hour  Intake 770.4 ml  Output 200 ml   Net 570.4 ml      01/17/2023    4:19 PM 01/14/2023    2:23 AM 01/13/2023    3:58 AM  Last 3 Weights  Weight (lbs) 153 lb 3.5 oz 153 lb 3.5 oz 144 lb 13.5 oz  Weight (kg) 69.5 kg 69.5 kg 65.7 kg     Body mass index is 21.37 kg/m.  General:  Well nourished, well developed, in no acute distress HEENT: normal Neck: no JVD Vascular: No carotid bruits; Distal pulses 2+ bilaterally Cardiac:  normal S1, S2; tachycardic; 4/6 harsh systolic murmur  Lungs:  clear to auscultation bilaterally, no wheezing, rhonchi or rales  Abd: soft, nontender, no hepatomegaly  Ext: no edema Musculoskeletal:  No deformities, BUE and BLE strength normal and equal. LUA AV fistula  Skin: warm and dry  Neuro:  CNs 2-12 intact, no focal abnormalities noted Psych:  Normal affect   EKG:  The EKG was personally reviewed and demonstrates:  ST, 123 bpm LVH, nonspecific changes   Relevant CV Studies:  Cath: 01/2021    Prox LAD to Mid LAD lesion is 30% stenosed.   Prox RCA lesion is 40% stenosed.   Prox Cx lesion is 40% stenosed.   Mid Cx lesion is 40% stenosed.  1st Diag lesion is 95% stenosed.   Mid LAD lesion is 40% stenosed.   Non-stenotic Ost Ramus lesion was previously treated.   1. Small high 1st diagonal with diffuse severe disease, similar to prior.  2. Otherwise, nonobstructive disease.    No interventional target.   Diagnostic Dominance: Right   TEE: 01/13/2023  MPRESSIONS     1. Left ventricular ejection fraction, by estimation, is 60 to 65%. The  left ventricle has normal function. The left ventricle has no regional  wall motion abnormalities. There is severe concentric left ventricular  hypertrophy.   2. Right ventricular systolic function is normal. The right ventricular  size is normal.   3. Left atrial size was severely dilated. No left atrial/left atrial  appendage thrombus was detected.   4. Right atrial size was moderately dilated.   5. The mitral valve is normal in  structure. Moderate mitral valve  regurgitation. No evidence of mitral stenosis.   6. The aortic valve has been repaired/replaced. There is moderate  calcification of the aortic valve prosthesis. There is moderate thickening  of the aortic valve prosthesis. Aortic valve regurgitation is trivial..  There is a 27 mm Mitroflow pericardial  valve present in the aortic position. The AVR is not well visualized but  there is at least moderate degeneration of the valve with at least  moderate aortic stenosis by visualization.   7. The inferior vena cava is normal in size with greater than 50%  respiratory variability, suggesting right atrial pressure of 3 mmHg.   8. Aortic root/ascending aorta has been repaired/replaced.   Conclusion(s)/Recommendation(s): Normal biventricular function with no  obvious vegetations. There is at least moderate stenosis of the AV  prosthesis.  FINDINGS   Left Ventricle: Left ventricular ejection fraction, by estimation, is 60  to 65%. The left ventricle has normal function. The left ventricle has no  regional wall motion abnormalities. The left ventricular internal cavity  size was normal in size. There is   severe concentric left ventricular hypertrophy.   Right Ventricle: The right ventricular size is normal. No increase in  right ventricular wall thickness. Right ventricular systolic function is  normal.   Left Atrium: Left atrial size was severely dilated. Spontaneous echo  contrast was present in the left atrium. No left atrial/left atrial  appendage thrombus was detected.   Right Atrium: Right atrial size was moderately dilated.   Pericardium: There is no evidence of pericardial effusion.   Mitral Valve: The mitral valve is normal in structure. Moderate mitral  valve regurgitation, with centrally-directed jet. No evidence of mitral  valve stenosis.   Tricuspid Valve: The tricuspid valve is normal in structure. Tricuspid  valve regurgitation is mild  . No evidence of tricuspid stenosis.   Aortic Valve: The aortic valve has been repaired/replaced. There is  moderate calcification of the aortic valve. There is moderate thickening  of the aortic valve. Aortic valve regurgitation is trivial. No aortic  stenosis is present. There is a 27 mm  Mitroflow pericardial valve present in the aortic position.   Pulmonic Valve: The pulmonic valve was normal in structure. Pulmonic valve  regurgitation is trivial. No evidence of pulmonic stenosis.   Aorta: The aortic root/ascending aorta has been repaired/replaced.   Venous: The inferior vena cava is normal in size with greater than 50%  respiratory variability, suggesting right atrial pressure of 3 mmHg.   IAS/Shunts: No atrial level shunt detected by color flow Doppler.   Additional Comments: Spectral Doppler performed.  Laboratory Data:  High Sensitivity Troponin:   Recent Labs  Lab 01/16/23 0930 01/17/23 1643 01/17/23 1850 01/17/23 2307 01/18/23 0034  TROPONINIHS 148* 1,215* 1,495* 1,562* 1,397*     Chemistry Recent Labs  Lab 01/16/23 0643 01/17/23 1643 01/17/23 1656 01/18/23 0236  NA 138 134* 134* 135  K 3.9 4.6 4.7 4.6  CL 96* 98 99 98  CO2 29 23  --  22  GLUCOSE 117* 84 81 87  BUN 87* 110* 112* 112*  CREATININE 7.45* 8.98* 9.80* 9.28*  CALCIUM 9.8 9.5  --  9.1  GFRNONAA 8* 7*  --  6*  ANIONGAP 13 13  --  15    Recent Labs  Lab 01/12/23 0925 01/17/23 1643 01/18/23 0236  PROT  --  6.2*  --   ALBUMIN 2.9* 2.9* 2.6*  AST  --  97*  --   ALT  --  <5  --   ALKPHOS  --  58  --   BILITOT  --  0.8  --    Lipids No results for input(s): "CHOL", "TRIG", "HDL", "LABVLDL", "LDLCALC", "CHOLHDL" in the last 168 hours.  Hematology Recent Labs  Lab 01/16/23 0643 01/17/23 1643 01/17/23 1656 01/18/23 0236  WBC 17.7* 31.1*  --  36.0*  RBC 3.27* 2.92*  --  2.71*  HGB 10.2* 9.0* 10.2* 8.5*  HCT 31.3* 27.8* 30.0* 25.8*  MCV 95.7 95.2  --  95.2  MCH 31.2 30.8  --  31.4   MCHC 32.6 32.4  --  32.9  RDW 14.9 15.5  --  15.7*  PLT 264 275  --  239   Thyroid No results for input(s): "TSH", "FREET4" in the last 168 hours.  BNP Recent Labs  Lab 01/17/23 1643  BNP 1,461.9*    DDimer No results for input(s): "DDIMER" in the last 168 hours.   Radiology/Studies:  DG Chest Port 1 View  Result Date: 01/17/2023 CLINICAL DATA:  Shortness of breath. Left chest pain and mid upper back pain for 1 week. EXAM: PORTABLE CHEST 1 VIEW COMPARISON:  Radiographs 01/16/2023 and 01/09/2023. Chest CTA 01/16/2023. FINDINGS: 1711 hours. There are extensive postsurgical changes from previous thoracic aorta stent grafting and aortic valve replacement. The heart size and mediastinal contours are stable. Stable mild scarring at the left lung base. The lungs otherwise appear clear. There is no pleural effusion or pneumothorax. Loop recorder and telemetry leads are noted. The bones appear unchanged. IMPRESSION: No evidence of acute cardiopulmonary process. Stable postsurgical changes. Electronically Signed   By: Carey Bullocks M.D.   On: 01/17/2023 17:30   CT Angio Chest/Abd/Pel for Dissection W and/or Wo Contrast  Result Date: 01/16/2023 CLINICAL DATA:  Acute aortic syndrome. EXAM: CT ANGIOGRAPHY CHEST, ABDOMEN AND PELVIS TECHNIQUE: Non-contrast CT of the chest was initially obtained. Multidetector CT imaging through the chest, abdomen and pelvis was performed using the standard protocol during bolus administration of intravenous contrast. Multiplanar reconstructed images and MIPs were obtained and reviewed to evaluate the vascular anatomy. RADIATION DOSE REDUCTION: This exam was performed according to the departmental dose-optimization program which includes automated exposure control, adjustment of the mA and/or kV according to patient size and/or use of iterative reconstruction technique. CONTRAST:  OMNIPAQUE IOHEXOL 350 MG/ML SOLN COMPARISON:  01/09/2023.  04/27/2022 FINDINGS: CTA  CHEST FINDINGS Cardiovascular: The heart is enlarged. No substantial pericardial effusion. Status post aortic valve replacement. Endograft identified in the thoracic aorta, similar to prior studies and patent. Postoperative change from debranching noted with patency of  the branch vessel anatomy. The lumen of the left brachiocephalic vein stent opacifies consistent with patency. Enlargement of the pulmonary outflow tract/main pulmonary arteries suggests pulmonary arterial hypertension. No large central pulmonary embolus. Mediastinum/Nodes: No mediastinal lymphadenopathy. There is no hilar lymphadenopathy. The esophagus has normal imaging features. There is no axillary lymphadenopathy. Lungs/Pleura: Subtle changes of centrilobular and paraseptal emphysema noted. Dependent atelectasis. No pleural effusion. Musculoskeletal: No worrisome lytic or sclerotic osseous abnormality. Review of the MIP images confirms the above findings. CTA ABDOMEN AND PELVIS FINDINGS VASCULAR Aorta: Postsurgical changes noted in the abdominal aorta, distal to the stent graft. Aneurysmal dilatation of the distal abdominal aorta measured previously at 3.2 cm is stable at 3.2 cm today. Fluid collection adjacent to the operative site described previously is stable, measuring 3.7 x 3.4 cm today compared to 3.7 x 3.5 cm previously. Celiac axis, and SMA are widely patent. Origin of the left renal artery not well demonstrated and similar to prior although left renal artery does opacify. Right renal artery appears widely patent. Celiac: Postsurgical change. As before, origin appears somewhat patulous without evidence for flow limiting stenosis. SMA: Surgical changes similar to prior with patulous origin. No features to suggest flow limiting stenosis. Renals: Stable.  Both renal arteries opacified, similar to prior. IMA: IMA does opacify. Inflow: Interval small dilatation of both common iliac arteries, similar to prior. Right common iliac artery  measures 2.0 cm diameter compared to 2.2 cm previously. 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 Atherosclerotic disease characterized as both external iliac arteries, patent bilaterally. Internal iliac arteries are patent bilaterally. Veins: No obvious venous abnormality within the limitations of this arterial phase study. Review of the MIP images confirms the above findings. NON-VASCULAR Hepatobiliary: Hypodensity in the right liver is too small to characterize but statistically most likely benign. Layering tiny calcified gallstones evident (167/24). No intrahepatic or extrahepatic biliary dilation. Pancreas: No focal mass lesion. No dilatation of the main duct. No intraparenchymal cyst. No peripancreatic edema. Spleen: No splenomegaly. No suspicious focal mass lesion. Adrenals/Urinary Tract: No adrenal nodule or mass. No hydronephrosis or overtly suspicious renal mass. No hydroureter. The urinary bladder appears normal for the degree of distention. Stomach/Bowel: Stomach is unremarkable. No gastric wall thickening. No evidence of outlet obstruction. Duodenum is normally positioned as is the ligament of Treitz. No small bowel wall thickening. No small bowel dilatation. Moderate to large stool volume. Lymphatic: No discernible lymphadenopathy in the abdomen or pelvis. Reproductive: Prostate gland is enlarged. Other: None. Musculoskeletal: No worrisome lytic or sclerotic osseous abnormality. Review of the MIP images confirms the above findings. IMPRESSION: 1. Stable exam. No new or acute findings in the chest, abdomen, or pelvis. 2. Stable postsurgical changes in the thoracic and abdominal aorta. Stable aneurysmal dilatation of the distal abdominal aorta measuring 3.2 cm diameter. Stable fluid collection adjacent to the operative site in the abdominal aorta. 3. 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 4. Enlargement of the pulmonary outflow tract/main pulmonary arteries suggests pulmonary arterial hypertension. 5. Cholelithiasis. 6. Prostatomegaly. 7. Aortic Atherosclerosis (ICD10-I70.0) and Emphysema (ICD10-J43.9). Electronically Signed   By: Kennith Center M.D.   On: 01/16/2023 10:11   DG Chest 2 View  Result Date: 01/16/2023 CLINICAL DATA:  Left-sided chest pain. EXAM: CHEST - 2 VIEW COMPARISON:  01/09/2023 FINDINGS: The lungs are clear without focal pneumonia, edema, pneumothorax or pleural effusion. The  cardiopericardial silhouette is within normal limits for size. Thoracic aortic stent graft again noted. No acute bony abnormality. Telemetry leads overlie the chest. IMPRESSION: No active cardiopulmonary disease. Electronically Signed   By: Kennith Center M.D.   On: 01/16/2023 07:17     Assessment and Plan:   [name] is a 52 y.o. male with a hx of NICM, HTN, ETOH use, CAD s/p NSTEMI PCI to RI '19, ascending aortic dissection s/p Bentall with AVR and arch repair '16 (At [facility]) with staged endovascular repair of descending aortic aneurysm, ESRD on HD, thrombocytopenia, CVA who is being seen 01/18/2023 for the evaluation of chest pain/elevated troponin at the request of Dr. Lowell Guitar.  NSTEMI  CAD s/p prior DES to Ramus -- high-sensitivity troponin 1215>>1495>>1562>>1397, EKG showed ST, LVH, nonspecific changes. Reports continuous on-going chest pain despite IV NTG, stating only morphine and dilaudid help with pain. Recent TEE with normal LVEF and RV. Possibly demand ischemia in the setting of acute illness/bacteremia though does have CAD hx. -- given his resistant bacteremia, would not consider a candidate for invasive work up at this time. Favor continued medical therapy -- on IV heparin, IV NTG, ASA, repatha (PTA), norvasc, hydralazine   Sepsis Recent diagnosis of bacteremia -- recent admission with gram positive bacteremia on IV cefazolin PTA. WBC up 31>>36.  Lactic 0.7 -- currently tachycardic and hypertensive -- switched to vancomycin and rocephin  -- BC pending -- ID consult via primary  ESRD on HD -- reports going to HD only once a week prior to recent admission. Now on TTS schedule to get antibiotics -- per nephrology  Vascular disease S/p Bentall with AVR/arch repair/repair of descending aortic aneurysm '16 -- CT chest/abd/pelvis stable with aneurysmal dilatation of abdominal aorta 3.2cm  HTN -- blood pressures elevated -- on IV NTG, norvasc and hydralazine   Per Primary COPD GERD Tobacco use  Risk Assessment/Risk Scores:   TIMI Risk Score for Unstable Angina or Non-ST Elevation MI:   The patient's TIMI risk score is 5, which indicates a 26% risk of all cause mortality, new or recurrent myocardial infarction or need for urgent revascularization in the next 14 days.{  For questions or updates, please contact [facility] Please consult [email or web address] for contact info under    Signed, Laverda Page, NP  01/18/2023 12:28 PM

## 2023-01-18 NOTE — Plan of Care (Signed)
  Problem: Health Behavior/Discharge Planning: Goal: Ability to manage health-related needs will improve Outcome: Progressing   Problem: Clinical Measurements: Goal: Ability to maintain clinical measurements within normal limits will improve Outcome: Not Progressing Goal: Will remain free from infection Outcome: Not Progressing Goal: Diagnostic test results will improve Outcome: Not Progressing Goal: Respiratory complications will improve Outcome: Not Progressing Goal: Cardiovascular complication will be avoided Outcome: Not Progressing   Problem: Activity: Goal: Risk for activity intolerance will decrease Outcome: Not Progressing   Problem: Coping: Goal: Level of anxiety will decrease Outcome: Not Progressing   Problem: Elimination: Goal: Will not experience complications related to urinary retention Outcome: Progressing   Problem: Pain Management: Goal: General experience of comfort will improve Outcome: Not Progressing   Problem: Skin Integrity: Goal: Risk for impaired skin integrity will decrease Outcome: Progressing

## 2023-01-18 NOTE — Progress Notes (Signed)
[name], Andre DOA: 01/17/2023 PCP: Loura Back, Andre  Chief Complaint  Patient presents with   Chest Pain    Brief Narrative:   BOWDEN BOODY is [name] 52 y.o. male with medical history significant of Ewing, [name], [name], [name], [name], [name] Elson Ulbrich week, GERD, descending thoracic aortic aneurysm, aortic valve replacement with bioprosthetic valve, severe left jugular venous distention, tobacco use and recent bacteremia presents today for evaluation of ongoing chest pain admitted for NSTEMI and sepsis of unknown source.  Assessment & Plan:   Principal Problem:   NSTEMI (non-ST elevated myocardial infarction) [facility]) Active Problems:   Sepsis (HCC)   Sepsis Hx of bacteremia Patient with [name] recent MSSE bacteremia without clear source TEE 10/30 without obvious vegetation He was discharged on cefazolin with plan for 6 weeks IV antibiotics during dialysis Leukocytosis worsened here to 30's, recurrent fever to 100 degrees F -> abx broadened to vancomycin/ceftriaxone CT chest abdomen pelvis without acute findings Repeat blood cultures pending from 11/3  L Sided Chest pain  Ewing Pain  Abdominal Discomfort Unclear cause - he notes his symptoms all started after L brachiocephalic arteriovenous angioplasty and stenting on 10/11.   His CT 11/2 did not show any acute findings (stable postsurgical changes in thoracic and abdominal aorta, stable aneurysmal dilatation of distal abdominal aorta.  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  Pulmonary arterial hypertension. Will ask vascular to see if they think any of his symptoms could be related to angioplasty/stenting 10/11? (He had dull ache in chest radiating to Ewing and L arm after procedure that was thought to be due to stent and recanalization of occluded brachiocephalic vein on that day)  NSTEMI  Pleuritic Chest Pain  Concern for Possible  Pericarditis Significantly elevated troponin in setting of chest pain and Andre Appreciate cardiology assistance - recommending continued medical management, planning for nuclear stress test.  Continue heparin gtt.  Plan for high dose aspirin for possible pericarditis.   Andre on HD Usually gets HD Ewing [name] week on Tuesdays but has been going 3 times weekly in order to receive his IV antibiotics for the bacteremia. -Resume HD next week   Ewing home amlodipine and hydralazine   Chronic HFpEF TEE on 10/30 showed EF 60-65%, severe LVH, severely dilated LA, moderately dilated RA, moderate mitral valve regurg, and moderate stenosis of the aortic valve prosthetic. Appears euvolemic    # CAD status post Ewing # [name] -Repatha every 14 days -ASA 81 mg daily   # COPD -Resume home inhalers   # GERD -Continue Protonix   #Tobacco use disorder States he has cut down to about 1 cigarette/day.  -Encouraged complete smoking cessation    DVT prophylaxis: heparin gtt Code Status: full Family Communication: none Disposition:   Status is: Inpatient Remains inpatient appropriate because: need for inpatient care   Consultants:  Vascular Cardiology ID  Procedures:  none  Antimicrobials:  Anti-infectives (From admission, onward)    Start     Dose/Rate Route Frequency Ordered Stop   01/19/23 1200  vancomycin (VANCOREADY) IVPB 750 mg/150 mL        750 mg 150 mL/hr over 60 Minutes Intravenous Every T-Th-Sa (Hemodialysis) 01/17/23 2217     01/18/23 1800  cefTRIAXone (ROCEPHIN) 2 g in sodium chloride 0.9 % 100 mL IVPB        2 g 200 mL/hr over 30 Minutes Intravenous Every 24  hours 01/17/23 2210     01/17/23 2215  cefTRIAXone (ROCEPHIN) 1 g in sodium chloride 0.9 % 100 mL IVPB        1 g 200 mL/hr over 30 Minutes Intravenous  Ewing 01/17/23 2208 01/18/23 0737   01/17/23 1800  vancomycin (VANCOCIN) IVPB 1000 mg/200 mL premix  Status:  Discontinued        1,000 mg 200 mL/hr over 60 Minutes  Intravenous  Ewing 01/17/23 1752 01/17/23 1755   01/17/23 1800  cefTRIAXone (ROCEPHIN) 1 g in sodium chloride 0.9 % 100 mL IVPB        1 g 200 mL/hr over 30 Minutes Intravenous  Ewing 01/17/23 1752 01/17/23 1850   01/17/23 1800  vancomycin (VANCOREADY) IVPB 1500 mg/300 mL        1,500 mg 150 mL/hr over 120 Minutes Intravenous  Ewing 01/17/23 1755 01/17/23 2046       Subjective: No complaints  Objective: Vitals:   01/18/23 0130 01/18/23 0417 01/18/23 0755 01/18/23 0907  BP: (!) 157/88 (!) 155/90  (!) 155/90  Pulse:  (!) 106 (!) 107   Resp:  20 17   Temp:  100 F (37.8 C)    TempSrc:  Oral    SpO2:   96%   Weight:      Height:        Intake/Output Summary (Last 24 hours) at 01/18/2023 1343 Last data filed at 01/18/2023 1318 Gross per 24 hour  Intake 1353.69 ml  Output 650 ml  Net 703.69 ml   Filed Weights   01/17/23 1619  Weight: 69.5 kg    Examination:  General exam: Appears calm and comfortable  Respiratory system: unlabored Cardiovascular system: S1 & S2 heard, RRR. Gastrointestinal system: soft- c/o discomfort on L Central nervous system: Alert and oriented. No focal neurological deficits. Extremities:no LEE  Data Reviewed: I have personally reviewed following labs and imaging studies  CBC: Recent Labs  Lab 01/13/23 0748 01/14/23 0847 01/16/23 0643 01/17/23 1643 01/17/23 1656 01/18/23 0236  WBC 11.5* 12.3* 17.7* 31.1*  --  36.0*  NEUTROABS 8.2* 8.3*  --  28.3*  --  31.2*  HGB 10.3* 9.9* 10.2* 9.0* 10.2* 8.5*  HCT 30.4* 29.2* 31.3* 27.8* 30.0* 25.8*  MCV 92.1 93.6 95.7 95.2  --  95.2  PLT 75* 121* 264 275  --  239    Basic Metabolic Panel: Recent Labs  Lab 01/12/23 0925 01/14/23 0847 01/16/23 0643 01/17/23 1643 01/17/23 1656 01/18/23 0236  NA 132* 133* 138 134* 134* 135  K 4.4 3.8 3.9 4.6 4.7 4.6  CL 97* 97* 96* 98 99 98  CO2 20* 25 29 23   --  22  GLUCOSE 167* 105* 117* 84 81 87  BUN 147* 110* 87* 110* 112* 112*  CREATININE 11.45* 8.97*  7.45* 8.98* 9.80* 9.28*  CALCIUM 10.3 10.0 9.8 9.5  --  9.1  PHOS 5.5*  --   --   --   --  6.1*    GFR: Estimated Creatinine Clearance: 9.4 mL/min (Aritzel Krusemark) (by C-G formula based on SCr of 9.28 mg/dL (H)).  Liver Function Tests: Recent Labs  Lab 01/12/23 0925 01/17/23 1643 01/18/23 0236  AST  --  97*  --   ALT  --  <5  --   ALKPHOS  --  58  --   BILITOT  --  0.8  --   PROT  --  6.2*  --   ALBUMIN 2.9* 2.9* 2.6*    CBG: No results  for input(s): "GLUCAP" in the last 168 hours.   Recent Results (from the past 240 hour(s))  Blood Culture (routine x 2)     Status: Abnormal   Collection Time: 01/09/23  3:39 PM   Specimen: BLOOD RIGHT ARM  Result Value Ref Range Status   Specimen Description   Final    BLOOD RIGHT ARM Performed at [facility], [address], [place], [place] [address]    Special Requests   Final    BOTTLES DRAWN AEROBIC AND ANAEROBIC Blood Culture adequate volume Performed at [facility], [address], [place], [place] [address]    Culture  Setup Time   Final    GRAM POSITIVE COCCI IN BOTH AEROBIC AND ANAEROBIC BOTTLES CRITICAL RESULT CALLED TO, READ Ewing BY AND VERIFIED WITH: SHIVANK PINEDO [id number] AT 0858 BY EC Performed at [facility], [address], [place], [place] [address]    Culture STAPHYLOCOCCUS EPIDERMIDIS (Geet Hosking)  Final   Report Status 01/12/2023 FINAL  Final   Organism ID, Bacteria STAPHYLOCOCCUS EPIDERMIDIS  Final      Susceptibility   Staphylococcus epidermidis - MIC*    CIPROFLOXACIN <=0.5 SENSITIVE Sensitive     ERYTHROMYCIN >=8 RESISTANT Resistant     GENTAMICIN <=0.5 SENSITIVE Sensitive     OXACILLIN <=0.25 SENSITIVE Sensitive     TETRACYCLINE >=16 RESISTANT Resistant     VANCOMYCIN 1 SENSITIVE Sensitive     TRIMETH/SULFA <=10 SENSITIVE Sensitive     CLINDAMYCIN <=0.25 SENSITIVE Sensitive     RIFAMPIN <=0.5 SENSITIVE Sensitive     Inducible Clindamycin NEGATIVE Sensitive     * STAPHYLOCOCCUS  EPIDERMIDIS  Blood Culture (routine x 2)     Status: Abnormal   Collection Time: 01/09/23  3:39 PM   Specimen: BLOOD RIGHT ARM  Result Value Ref Range Status   Specimen Description   Final    BLOOD RIGHT ARM Performed at [facility], [address] [place], [place] [address]    Special Requests   Final    BOTTLES DRAWN AEROBIC AND ANAEROBIC Blood Culture results may not be optimal due to an excessive volume of blood received in culture bottles Performed at [facility], [address] [place], [place] [address]    Culture  Setup Time   Final    GRAM POSITIVE COCCI IN BOTH AEROBIC AND ANAEROBIC BOTTLES CRITICAL VALUE NOTED.  VALUE IS CONSISTENT WITH PREVIOUSLY REPORTED AND CALLED VALUE. Performed at [facility], [address] [place], [place] [address]    Culture STAPHYLOCOCCUS EPIDERMIDIS (Cameren Earnest)  Final   Report Status 01/13/2023 FINAL  Final   Organism ID, Bacteria STAPHYLOCOCCUS EPIDERMIDIS  Final      Susceptibility   Staphylococcus epidermidis - MIC*    CIPROFLOXACIN <=0.5 SENSITIVE Sensitive     ERYTHROMYCIN >=8 RESISTANT Resistant     GENTAMICIN <=0.5 SENSITIVE Sensitive     OXACILLIN <=0.25 SENSITIVE Sensitive     TETRACYCLINE >=16 RESISTANT Resistant     VANCOMYCIN 1 SENSITIVE Sensitive     TRIMETH/SULFA <=10 SENSITIVE Sensitive     CLINDAMYCIN <=0.25 SENSITIVE Sensitive     RIFAMPIN <=0.5 SENSITIVE Sensitive     Inducible Clindamycin NEGATIVE Sensitive     * STAPHYLOCOCCUS EPIDERMIDIS  Resp panel by RT-PCR (RSV, Flu Dezmond Downie&B, Covid)     Status: None   Collection Time: 01/09/23  3:39 PM   Specimen: Nasal Swab  Result Value Ref Range Status   SARS  Coronavirus 2 by RT PCR NEGATIVE NEGATIVE Final    Comment: (NOTE) SARS-CoV-2 target nucleic acids are NOT DETECTED.  The SARS-CoV-2 RNA is generally detectable in upper respiratory specimens during the acute phase of infection. The lowest concentration of SARS-CoV-2 viral copies this assay can  detect is 138 copies/mL. Sapphire Tygart negative result does not preclude SARS-Cov-2 infection and should not be used as the sole basis for treatment or other patient management decisions. Jaymian Bogart negative result may occur with  improper specimen collection/handling, submission of specimen other than nasopharyngeal swab, presence of viral mutation(s) within the areas targeted by this assay, and inadequate number of viral copies(<138 copies/mL). Coleson Kant negative result must be combined with clinical observations, patient history, and epidemiological information. The expected result is Negative.  Fact Sheet for Patients:  BloggerCourse.com  Fact Sheet for Healthcare Providers:  SeriousBroker.it  This test is no t yet approved or cleared by the [place] FDA and  has been authorized for detection and/or diagnosis of SARS-CoV-2 by FDA under an Emergency Use Authorization (EUA). This EUA will remain  in effect (meaning this test can be used) for the duration of the COVID-Ewing declaration under Section 564(b)(1) of the Act, 21 U.S.C.section 360bbb-3(b)(1), unless the authorization is terminated  or revoked sooner.       Influenza Gerod Caligiuri by PCR NEGATIVE NEGATIVE Final   Influenza B by PCR NEGATIVE NEGATIVE Final    Comment: (NOTE) The Xpert Xpress SARS-CoV-2/FLU/RSV plus assay is intended as an aid in the diagnosis of influenza from Nasopharyngeal swab specimens and should not be used as Dontrel Smethers sole basis for treatment. Nasal washings and aspirates are unacceptable for Xpert Xpress SARS-CoV-2/FLU/RSV testing.  Fact Sheet for Patients: BloggerCourse.com  Fact Sheet for Healthcare Providers: SeriousBroker.it  This test is not yet approved or cleared by the [place] FDA and has been authorized for detection and/or diagnosis of SARS-CoV-2 by FDA under an Emergency Use Authorization (EUA). This EUA will remain in  effect (meaning this test can be used) for the duration of the COVID-Ewing declaration under Section 564(b)(1) of the Act, 21 U.S.C. section 360bbb-3(b)(1), unless the authorization is terminated or revoked.     Resp Syncytial Virus by PCR NEGATIVE NEGATIVE Final    Comment: (NOTE) Fact Sheet for Patients: BloggerCourse.com  Fact Sheet for Healthcare Providers: SeriousBroker.it  This test is not yet approved or cleared by the [place] FDA and has been authorized for detection and/or diagnosis of SARS-CoV-2 by FDA under an Emergency Use Authorization (EUA). This EUA will remain in effect (meaning this test can be used) for the duration of the COVID-Ewing declaration under Section 564(b)(1) of the Act, 21 U.S.C. section 360bbb-3(b)(1), unless the authorization is terminated or revoked.  Performed at [facility], [address] [place], [place] [address]   Blood Culture ID Panel (Reflexed)     Status: Abnormal   Collection Time: 01/09/23  3:39 PM  Result Value Ref Range Status   Enterococcus faecalis NOT DETECTED NOT DETECTED Final   Enterococcus Faecium NOT DETECTED NOT DETECTED Final   Listeria monocytogenes NOT DETECTED NOT DETECTED Final   Staphylococcus species DETECTED (Raydan Schlabach) NOT DETECTED Final    Comment: CRITICAL RESULT CALLED TO, READ Ewing BY AND VERIFIED WITH: PHARMD CARLA JARDIN [id number] AT 0858 BY EC    Staphylococcus aureus (BCID) NOT DETECTED NOT DETECTED Final   Staphylococcus epidermidis DETECTED (Darci Lykins) NOT DETECTED Final    Comment: CRITICAL RESULT CALLED TO, READ Ewing BY AND VERIFIED WITH: PHARMD CARLA  JARDIN [id number] AT 0858 BY EC    Staphylococcus lugdunensis NOT DETECTED NOT DETECTED Final   Streptococcus species NOT DETECTED NOT DETECTED Final   Streptococcus agalactiae NOT DETECTED NOT DETECTED Final   Streptococcus pneumoniae NOT DETECTED NOT DETECTED Final   Streptococcus pyogenes NOT  DETECTED NOT DETECTED Final   Janai Brannigan.calcoaceticus-baumannii NOT DETECTED NOT DETECTED Final   Bacteroides fragilis NOT DETECTED NOT DETECTED Final   Enterobacterales NOT DETECTED NOT DETECTED Final   Enterobacter cloacae complex NOT DETECTED NOT DETECTED Final   Escherichia coli NOT DETECTED NOT DETECTED Final   Klebsiella aerogenes NOT DETECTED NOT DETECTED Final   Klebsiella oxytoca NOT DETECTED NOT DETECTED Final   Klebsiella pneumoniae NOT DETECTED NOT DETECTED Final   Proteus species NOT DETECTED NOT DETECTED Final   Salmonella species NOT DETECTED NOT DETECTED Final   Serratia marcescens NOT DETECTED NOT DETECTED Final   Haemophilus influenzae NOT DETECTED NOT DETECTED Final   Neisseria meningitidis NOT DETECTED NOT DETECTED Final   Pseudomonas aeruginosa NOT DETECTED NOT DETECTED Final   Stenotrophomonas maltophilia NOT DETECTED NOT DETECTED Final   Candida albicans NOT DETECTED NOT DETECTED Final   Candida auris NOT DETECTED NOT DETECTED Final   Candida glabrata NOT DETECTED NOT DETECTED Final   Candida krusei NOT DETECTED NOT DETECTED Final   Candida parapsilosis NOT DETECTED NOT DETECTED Final   Candida tropicalis NOT DETECTED NOT DETECTED Final   Cryptococcus neoformans/gattii NOT DETECTED NOT DETECTED Final   Methicillin resistance mecA/C NOT DETECTED NOT DETECTED Final    Comment: Performed at [facility], [address] [place], [place] [address]  Culture, blood (Routine X 2) w Reflex to ID Panel     Status: None   Collection Time: 01/11/23  6:11 AM   Specimen: BLOOD  Result Value Ref Range Status   Specimen Description BLOOD BLOOD RIGHT ARM  Final   Special Requests   Final    BOTTLES DRAWN AEROBIC AND ANAEROBIC Blood Culture adequate volume   Culture   Final    NO GROWTH 5 DAYS Performed at [facility], [address], [place], [place] [address]    Report Status 01/16/2023 FINAL  Final  Culture, blood (Routine X 2) w Reflex to ID Panel     Status: None    Collection Time: 01/11/23  6:17 AM   Specimen: BLOOD  Result Value Ref Range Status   Specimen Description BLOOD BLOOD RIGHT HAND  Final   Special Requests   Final    BOTTLES DRAWN AEROBIC AND ANAEROBIC Blood Culture adequate volume   Culture   Final    NO GROWTH 5 DAYS Performed at [facility], [address] [place], [place] [address]    Report Status 01/16/2023 FINAL  Final         Radiology Studies: DG Chest Port 1 View  Result Date: 01/17/2023 CLINICAL DATA:  Shortness of breath. Left chest pain and mid upper Ewing pain for 1 week. EXAM: PORTABLE CHEST 1 VIEW COMPARISON:  Radiographs 01/16/2023 and 01/09/2023. Chest CTA 01/16/2023. FINDINGS: 1711 hours. There are extensive postsurgical changes from previous thoracic aorta stent grafting and aortic valve replacement. The heart size and mediastinal contours are stable. Stable mild scarring at the left lung base. The lungs otherwise appear clear. There is no pleural effusion or pneumothorax. Loop recorder and telemetry leads are noted. The bones appear unchanged. IMPRESSION: No evidence of acute cardiopulmonary process. Stable postsurgical changes. Electronically Signed   By: Chrissie Noa  Purcell Mouton M.D.   On: 01/17/2023 17:30        Scheduled Meds:  amLODipine  10 mg Oral Daily   arformoterol  15 mcg Nebulization BID   And   umeclidinium bromide  1 puff Inhalation Daily   aspirin EC  650 mg Oral TID   diclofenac Sodium  2 g Topical QID   feeding supplement (NEPRO CARB STEADY)  237 mL Oral BID BM   hydrALAZINE  50 mg Oral TID   pantoprazole  40 mg Oral BID   Continuous Infusions:  cefTRIAXone (ROCEPHIN)  IV     heparin 1,150 Units/hr (01/18/23 1312)   nitroGLYCERIN 35 mcg/min (01/18/23 1318)   [START ON 01/19/2023] vancomycin       LOS: 1 day    Time spent: over 30 min     Lacretia Nicks, MD [facility]   To contact the attending provider between 7A-7P or the covering provider during after hours  7P-7A, please log into the web site [email or web address] and access using universal [place] password for that web site. If you do not have the password, please call the hospital operator.  01/18/2023, 1:43 PM

## 2023-01-18 NOTE — Progress Notes (Addendum)
Patient complaining throughout the day of 10/10 pain in his chest. Along with PRN pain meds, pt is finding no relief. MD is aware.  Pt sats have been maintained on room air all day and suddenly decreased. Patient was placed on [facility] with sats 90-93%. At this time, pt complaining of chest pain worse than it has been all day.  Consulting civil engineer notified.  MD Lowell Guitar paged and present at bedside.   Chest XR ordered. MD aware of pt status.

## 2023-01-18 NOTE — Progress Notes (Signed)
PHARMACY - ANTICOAGULATION  Pharmacy Consult for heparin Indication: chest pain/ACS Brief A/P: Heparin level subtherapeutic Increase Heparin rate  Allergies  Allergen Reactions   Clonidine Derivatives Nausea And Vomiting and Other (See Comments)    Tablets by mouth = Tolerated  Patches on the skin = Nausea & vomiting after wearing 4-5 days    Imdur [Isosorbide Nitrate] Other (See Comments)    Headaches -- patient instructed to continue taking    Tramadol Other (See Comments)    Headaches     Patient Measurements: Height: 5\' 11"  (180.3 cm) Weight: 69.5 kg (153 lb 3.5 oz) IBW/kg (Calculated) : 75.3 Heparin Dosing Weight: TBW  Vital Signs: Temp: 98.5 F (36.9 C) (11/03 2108) Temp Source: Oral (11/03 2108) BP: 157/88 (11/04 0130) Pulse Rate: 101 (11/03 2343)  Labs: Recent Labs    01/16/23 0643 01/16/23 0930 01/17/23 1643 01/17/23 1656 01/17/23 1850 01/17/23 2307 01/18/23 0034 01/18/23 0236  HGB 10.2*  --  9.0* 10.2*  --   --   --  8.5*  HCT 31.3*  --  27.8* 30.0*  --   --   --  25.8*  PLT 264  --  275  --   --   --   --  239  LABPROT  --   --  15.2  --   --   --   --   --   INR  --   --  1.2  --   --   --   --   --   HEPARINUNFRC  --   --   --   --   --   --   --  0.11*  CREATININE 7.45*  --  8.98* 9.80*  --   --   --  9.28*  TROPONINIHS 139*   < > 1,215*  --  1,495* 1,562* 1,397*  --    < > = values in this interval not displayed.    Estimated Creatinine Clearance: 9.4 mL/min (A) (by C-G formula based on SCr of 9.28 mg/dL (H)).  Assessment: 52 y.o. male with chest pain for heparin   Goal of Therapy:  Heparin level 0.3-0.7 units/ml Monitor platelets by anticoagulation protocol: Yes   Plan:  Heparin 2000 units IV bolus, then increase heparin  1150 units/hr Check heparin level in 8 hours.   Geannie Risen, PharmD, BCPS

## 2023-01-18 NOTE — Progress Notes (Signed)
PHARMACY - ANTICOAGULATION CONSULT NOTE  Pharmacy Consult for heparin Indication: chest pain/ACS  Allergies  Allergen Reactions   Clonidine Derivatives Nausea And Vomiting and Other (See Comments)    Tablets by mouth = Tolerated  Patches on the skin = Nausea & vomiting after wearing 4-5 days    Imdur [Isosorbide Nitrate] Other (See Comments)    Headaches -- patient instructed to continue taking    Tramadol Other (See Comments)    Headaches     Patient Measurements: Height: 5\' 11"  (180.3 cm) Weight: 69.5 kg (153 lb 3.5 oz) IBW/kg (Calculated) : 75.3 Heparin Dosing Weight: TBW  Vital Signs: Temp: 100 F (37.8 C) (11/04 0417) Temp Source: Oral (11/04 0417) BP: 155/90 (11/04 0907) Pulse Rate: 107 (11/04 0755)  Labs: Recent Labs    01/16/23 0643 01/16/23 0930 01/17/23 1643 01/17/23 1656 01/17/23 1850 01/17/23 2307 01/18/23 0034 01/18/23 0236 01/18/23 1307  HGB 10.2*  --  9.0* 10.2*  --   --   --  8.5*  --   HCT 31.3*  --  27.8* 30.0*  --   --   --  25.8*  --   PLT 264  --  275  --   --   --   --  239  --   LABPROT  --   --  15.2  --   --   --   --   --   --   INR  --   --  1.2  --   --   --   --   --   --   HEPARINUNFRC  --   --   --   --   --   --   --  0.11* 0.10*  CREATININE 7.45*  --  8.98* 9.80*  --   --   --  9.28*  --   TROPONINIHS 139*   < > 1,215*  --  1,495* 1,562* 1,397*  --   --    < > = values in this interval not displayed.    Estimated Creatinine Clearance: 9.4 mL/min (A) (by C-G formula based on SCr of 9.28 mg/dL (H)).   Medical History: Past Medical History:  Diagnosis Date   Adenomatous colon polyp 08/2015   Anxiety    Aortic disease (HCC)    Aortic dissection (HCC)    a. admx 04/2014 >> L renal infarct; a/c renal failure >> b.  s/p Bioprosthetic Bentall and total arch replacement and staged endovascular repair of descending aortic aneurysm ([facility] - Dr. Kizzie Bane)   CAD (coronary artery disease)    a. LHC 4/16:  oD1 60%   Cardiomyopathy  (HCC)    a. non-ischemic - probably related to untreated HTN and ETOH abuse - Echo 3/13 with EF 35-40% >> b. Echo 4/16: Severe LVH, EF 55-60%, moderate AI, moderate MR, mild LAE, trivial effusion, known type B dissection with communication between true and false lumens with suprasternal images suggesting dissection plane may propagate to at least left subclavian takeoff, root above aortic valve okay     Chronic abdominal pain    Chronic combined systolic and diastolic congestive heart failure (HCC)    a. 05/2011: Adm with pulm edema/HTN urgency, EF 35-40% with diffuse hypokinesis and moderate to severe mitral regurgitation. Cardiomyopathy likely due to uncontrolled HTN and ETOH abuse - cath deferred due to renal insufficiency (felt due to uncontrolled HTN). bElzie Rings MV 06/2011: EF 37% and no ischemia or infarction. c. EF 45-50% by echo 01/2012.  Chronic sinusitis    DDD (degenerative disc disease), lumbar    Depression    Descending thoracic aortic aneurysm (HCC)    Dissecting aneurysm of thoracic aorta (HCC)    ESRD (end stage renal disease) (HCC)    on dialysis 1-2 days per week (2 days ordered, only coming 1 day per week as of oct 2024). on dialysis 3-4 years total   ETOH abuse    a. Reported to have quit 05/2011.   Frequent headaches    GERD (gastroesophageal reflux disease)    Headache(784.0)    "q other day" (08/08/2013)   Heart murmur    Hemorrhoid thrombosis    History of echocardiogram    Echo 1/17:  Severe LVH, EF 55-60%, no RWMA, Gr 2 DD, AVR ok, mild to mod MR, mild LAE, mild reduced RVSF, mod RAE   History of medication noncompliance    HYPERLIPIDEMIA    Hypertension    a. Hx of HTN urgency secondary to noncompliance. b. urinary metanephrine and catecholeamine levels normal 2013.  c. Renal art [name] 1/16:  No evidence of renal artery stenosis noted bilaterally.   INGUINAL HERNIA    Pneumonia ~ 2013   Serrated adenoma of colon 08/2015   Stroke [facility])    Tobacco abuse    Valvular  heart disease    a. Echo 05/2011: moderate to severe eccentric MR and mild to moderate AI with prolapsing left coronary cusp. b. Echo 01/2012: mild-mod AI, mild dilitation of aortic root, mild MR.;  c. Echo 1/16: Severe LVH consistent with hypertrophic cardio myopathy, EF 50%, no RWMA, mod AI, mild MR, mild RAE, dilated Ao root (40 mm);     Assessment: 52 YOM presenting with CP, elevated troponin, he is not on anticoagulation PTA, pharmacy to dose heparin.  Heparin level remains subtherapeutic at 0.1, no infusion issues or bleeding noted by RN.  Goal of Therapy:  Heparin level 0.3-0.7 units/ml Monitor platelets by anticoagulation protocol: Yes   Plan:  Heparin 2000 units x1 Increase heparin to 1400 units/h Recheck heparin level in 8h  Fredonia Highland, PharmD, [place], [facility] Clinical Pharmacist [phone] Please check AMION for all [facility] numbers 01/18/2023

## 2023-01-18 NOTE — Consult Note (Addendum)
Hospital Consult    Reason for Consult: Chest pain Requesting Physician:  Dr. Lowell Guitar MRN #:  [id number]  History of Present Illness: This is a 52 y.o. male being seen in consultation for evaluation of chest pain radiating to the back.  Surgical history significant for left brachiocephalic arteriovenous fistula angioplasty and stenting by Dr. Lenell Antu on 12/25/2022.  Patient states since stent placement, he has experienced constant sharp stabbing chest pain radiating to his back.  Workup has revealed elevated white count with NSTEMI.  His surgical history is also significant for aortic root replacement with the branching and thoracic aortic dissection repair by Dr. Kizzie Bane at [facility] in 2018.  He has been on nightly Rocephin however vancomycin has been added to broaden the spectrum.[name]  He denies any problems using his left  AV fistula.  He is scheduled for nuclear stress test in the morning.  Past Medical History:  Diagnosis Date   Adenomatous colon polyp 08/2015   Anxiety    Aortic disease (HCC)    Aortic dissection (HCC)    a. admx 04/2014 >> L renal infarct; a/c renal failure >> b.  s/p Bioprosthetic Bentall and total arch replacement and staged endovascular repair of descending aortic aneurysm ([facility] - Dr. Kizzie Bane)   CAD (coronary artery disease)    a. LHC 4/16:  oD1 60%   Cardiomyopathy (HCC)    a. non-ischemic - probably related to untreated HTN and ETOH abuse - Echo 3/13 with EF 35-40% >> b. Echo 4/16: Severe LVH, EF 55-60%, moderate AI, moderate MR, mild LAE, trivial effusion, known type B dissection with communication between true and false lumens with suprasternal images suggesting dissection plane may propagate to at least left subclavian takeoff, root above aortic valve okay     Chronic abdominal pain    Chronic combined systolic and diastolic congestive heart failure (HCC)    a. 05/2011: Adm with pulm edema/HTN urgency, EF 35-40% with diffuse hypokinesis and moderate to severe mitral  regurgitation. Cardiomyopathy likely due to uncontrolled HTN and ETOH abuse - cath deferred due to renal insufficiency (felt due to uncontrolled HTN). bElzie Rings MV 06/2011: EF 37% and no ischemia or infarction. c. EF 45-50% by echo 01/2012.   Chronic sinusitis    DDD (degenerative disc disease), lumbar    Depression    Descending thoracic aortic aneurysm (HCC)    Dissecting aneurysm of thoracic aorta (HCC)    ESRD (end stage renal disease) (HCC)    on dialysis 1-2 days per week (2 days ordered, only coming 1 day per week as of oct 2024). on dialysis 3-4 years total   ETOH abuse    a. Reported to have quit 05/2011.   Frequent headaches    GERD (gastroesophageal reflux disease)    Headache(784.0)    "q other day" (08/08/2013)   Heart murmur    Hemorrhoid thrombosis    History of echocardiogram    Echo 1/17:  Severe LVH, EF 55-60%, no RWMA, Gr 2 DD, AVR ok, mild to mod MR, mild LAE, mild reduced RVSF, mod RAE   History of medication noncompliance    HYPERLIPIDEMIA    Hypertension    a. Hx of HTN urgency secondary to noncompliance. b. urinary metanephrine and catecholeamine levels normal 2013.  c. Renal art [name] 1/16:  No evidence of renal artery stenosis noted bilaterally.   INGUINAL HERNIA    Pneumonia ~ 2013   Serrated adenoma of colon 08/2015   Stroke [facility])    Tobacco abuse  Valvular heart disease    a. Echo 05/2011: moderate to severe eccentric MR and mild to moderate AI with prolapsing left coronary cusp. b. Echo 01/2012: mild-mod AI, mild dilitation of aortic root, mild MR.;  c. Echo 1/16: Severe LVH consistent with hypertrophic cardio myopathy, EF 50%, no RWMA, mod AI, mild MR, mild RAE, dilated Ao root (40 mm);     Past Surgical History:  Procedure Laterality Date   A/V FISTULAGRAM Left 03/07/2021   Procedure: A/V FISTULAGRAM;  Surgeon: Chuck Hint, MD;  Location: [facility] INVASIVE CV LAB;  Service: Cardiovascular;  Laterality: Left;   A/V FISTULAGRAM Left 07/18/2021    Procedure: A/V Fistulagram;  Surgeon: Chuck Hint, MD;  Location: [facility] INVASIVE CV LAB;  Service: Cardiovascular;  Laterality: Left;   A/V FISTULAGRAM Left 12/25/2022   Procedure: A/V Fistulagram;  Surgeon: Leonie Douglas, MD;  Location: [facility] INVASIVE CV LAB;  Service: Cardiovascular;  Laterality: Left;   ANKLE SURGERY Bilateral    Fractures bilaterally   AORTIC VALVE SURGERY  09/2014   AV FISTULA PLACEMENT Left 09/09/2020   Procedure: LEFT ARM ARTERIOVENOUS (AV) FISTULA CREATION;  Surgeon: Leonie Douglas, MD;  Location: [facility] OR;  Service: Vascular;  Laterality: Left;   BASCILIC VEIN TRANSPOSITION Left 10/09/2020   Procedure: LEFT SECOND STAGE BASILIC VEIN TRANSPOSITION;  Surgeon: Leonie Douglas, MD;  Location: [facility] OR;  Service: Vascular;  Laterality: Left;  PERIPHERAL NERVE BLOCK   CORONARY ANGIOGRAPHY N/A 12/06/2017   Procedure: CORONARY ANGIOGRAPHY;  Surgeon: Lyn Records, MD;  Location: [facility] INVASIVE CV LAB;  Service: Cardiovascular;  Laterality: N/A;   CORONARY ANGIOGRAPHY N/A 09/26/2018   Procedure: CORONARY ANGIOGRAPHY;  Surgeon: Yvonne Kendall, MD;  Location: [facility] INVASIVE CV LAB;  Service: Cardiovascular;  Laterality: N/A;   CORONARY STENT INTERVENTION N/A 12/10/2017   Procedure: CORONARY STENT INTERVENTION;  Surgeon: Lennette Bihari, MD;  Location: [facility] INVASIVE CV LAB;  Service: Cardiovascular;  Laterality: N/A;   FOOT FRACTURE SURGERY Bilateral 2004-2010   "got pins in both of them"   HEMORRHOID SURGERY N/A 06/15/2015   Procedure: HEMORRHOIDECTOMY;  Surgeon: Almond Lint, MD;  Location: [facility] OR;  Service: General;  Laterality: N/A;   INGUINAL HERNIA REPAIR Right ~ 1996   INSERTION OF DIALYSIS CATHETER N/A 09/09/2020   Procedure: INSERTION OF TUNNELED DIALYSIS PALINDROME 19cm CATHETER;  Surgeon: Leonie Douglas, MD;  Location: [facility] OR;  Service: Vascular;  Laterality: N/A;   LEFT HEART CATH AND CORONARY ANGIOGRAPHY N/A 02/05/2021   Procedure: LEFT HEART CATH AND CORONARY ANGIOGRAPHY;   Surgeon: Laurey Morale, MD;  Location: [facility] INVASIVE CV LAB;  Service: Cardiovascular;  Laterality: N/A;   LEFT HEART CATHETERIZATION WITH CORONARY ANGIOGRAM N/A 06/21/2014   Procedure: LEFT HEART CATHETERIZATION WITH CORONARY ANGIOGRAM;  Surgeon: Laurey Morale, MD;  Location: [facility] CATH LAB;  Service: Cardiovascular;  Laterality: N/A;   LOOP RECORDER INSERTION N/A 06/19/2016   Procedure: Loop Recorder Insertion;  Surgeon: [facility] Salvia, MD;  Location: [facility] INVASIVE CV LAB;  Service: Cardiovascular;  Laterality: N/A;   PERIPHERAL VASCULAR BALLOON ANGIOPLASTY  03/07/2021   Procedure: PERIPHERAL VASCULAR BALLOON ANGIOPLASTY;  Surgeon: Chuck Hint, MD;  Location: [facility] INVASIVE CV LAB;  Service: Cardiovascular;;   PERIPHERAL VASCULAR BALLOON ANGIOPLASTY  07/18/2021   Procedure: PERIPHERAL VASCULAR BALLOON ANGIOPLASTY;  Surgeon: Chuck Hint, MD;  Location: [facility] INVASIVE CV LAB;  Service: Cardiovascular;;   PERIPHERAL VASCULAR INTERVENTION Left 12/25/2022   Procedure: PERIPHERAL VASCULAR INTERVENTION;  Surgeon: Leonie Douglas, MD;  Location: [facility] INVASIVE CV LAB;  Service: Cardiovascular;  Laterality: Left;  stent to brachiocephalic vein   PERIPHERAL VASCULAR ULTRASOUND/IVUS Left 12/25/2022   Procedure: Peripheral Vascular Ultrasound/IVUS;  Surgeon: Leonie Douglas, MD;  Location: [facility] INVASIVE CV LAB;  Service: Cardiovascular;  Laterality: Left;   TEE WITHOUT CARDIOVERSION N/A 03/12/2016   Procedure: TRANSESOPHAGEAL ECHOCARDIOGRAM (TEE);  Surgeon: Thurmon Fair, MD;  Location: [facility] ENDOSCOPY;  Service: Cardiovascular;  Laterality: N/A;   TEE WITHOUT CARDIOVERSION N/A 10/16/2020   Procedure: TRANSESOPHAGEAL ECHOCARDIOGRAM (TEE);  Surgeon: Laurey Morale, MD;  Location: [facility] ENDOSCOPY;  Service: Cardiovascular;  Laterality: N/A;   TRANSESOPHAGEAL ECHOCARDIOGRAM (CATH LAB) N/A 01/13/2023   Procedure: TRANSESOPHAGEAL ECHOCARDIOGRAM;  Surgeon: Quintella Reichert, MD;  Location: [facility] INVASIVE CV LAB;   Service: Cardiovascular;  Laterality: N/A;   UPPER EXTREMITY VENOGRAPHY Right 07/18/2021   Procedure: UPPER EXTREMITY VENOGRAPHY;  Surgeon: Chuck Hint, MD;  Location: [facility] INVASIVE CV LAB;  Service: Cardiovascular;  Laterality: Right;    Allergies  Allergen Reactions   Clonidine Derivatives Nausea And Vomiting and Other (See Comments)    Tablets by mouth = Tolerated  Patches on the skin = Nausea & vomiting after wearing 4-5 days    Imdur [Isosorbide Nitrate] Other (See Comments)    Headaches -- patient instructed to continue taking    Tramadol Other (See Comments)    Headaches     Prior to Admission medications   Medication Sig Start Date End Date Taking? Authorizing Provider  acetaminophen (TYLENOL) 325 MG tablet Take 2 tablets (650 mg total) by mouth every 4 (four) hours as needed. 01/14/23 01/14/24 Yes Adhikari, Willia Craze, MD  albuterol (VENTOLIN HFA) 108 (90 Base) MCG/ACT inhaler Inhale 1 puff into the lungs every 6 (six) hours as needed for wheezing or shortness of breath. 05/24/21  Yes [provider]  amLODipine (NORVASC) 10 MG tablet TAKE 1 TABLET(10 MG) BY MOUTH DAILY Patient taking differently: Take 10 mg by mouth daily. 07/15/21  Yes Laurey Morale, MD  aspirin EC 81 MG EC tablet Take 1 tablet (81 mg total) by mouth daily. 12/12/17  Yes [facility], Roe Rutherford, PA  ceFAZolin (ANCEF) IVPB Inject 2 g into the vein Every Tuesday,Thursday,and Saturday with dialysis. Indication:  MSSE bacteremia Last Day of Therapy:  02/22/23 Labs - Once weekly:  CBC/D and BMP, Labs - Once weekly: ESR and CRP Method of administration: Per hemodialysis protocol 01/14/23 02/22/23 Yes Adhikari, Willia Craze, MD  Evolocumab (REPATHA SURECLICK) 140 MG/ML SOAJ Inject 140 mg into the skin every 14 (fourteen) days. 10/28/22  Yes Hilty, Lisette Abu, MD  hydrALAZINE (APRESOLINE) 100 MG tablet Take 0.5 tablets (50 mg total) by mouth 3 (three) times daily. 01/14/23  Yes Burnadette Pop, MD   HYDROcodone-acetaminophen (NORCO/VICODIN) 5-325 MG tablet Take 1 tablet by mouth every 6 (six) hours as needed for moderate pain. 12/21/22  Yes Vanetta Mulders, MD  lidocaine-prilocaine (EMLA) cream Apply 1 application. topically as needed (prior to port being accessed). Use on dialysis days (Tues, Thurs and Sat)   Yes [provider]  meclizine (ANTIVERT) 25 MG tablet Take 1 tablet (25 mg total) by mouth 3 (three) times daily as needed for dizziness. 12/21/22  Yes Vanetta Mulders, MD  nitroGLYCERIN (NITROSTAT) 0.4 MG SL tablet Place 1 tablet (0.4 mg total) under the tongue every 5 (five) minutes as needed for chest pain. 01/14/23  Yes Burnadette Pop, MD  pantoprazole (PROTONIX) 40 MG tablet Take 40 mg by mouth daily.   Yes [provider]  polyethylene glycol powder (GLYCOLAX/MIRALAX) 17 GM/SCOOP powder Take 17 g by mouth daily. 01/14/23  Yes Burnadette Pop, MD  predniSONE (DELTASONE) 20 MG tablet Take 2 tablets (40 mg total) by mouth daily with breakfast for 3 days. 01/15/23 01/18/23 Yes Adhikari, Willia Craze, MD  senna-docusate (SENOKOT-S) 8.6-50 MG tablet Take 2 tablets by mouth 2 (two) times daily. 01/14/23  Yes Burnadette Pop, MD  STIOLTO RESPIMAT 2.5-2.5 MCG/ACT AERS Inhale 2 puffs into the lungs at bedtime. 12/29/22  Yes [provider]  oxyCODONE-acetaminophen (PERCOCET) 5-325 MG tablet Take 1 tablet by mouth every 4 (four) hours as needed for severe pain. Patient not taking: Reported on 01/09/2023 12/25/22 12/25/23  Leonie Douglas, MD    Social History   Socioeconomic History   Marital status: Married    Spouse name: Not on file   Number of children: 5   Years of education: 12   Highest education level: Not on file  Occupational History   Occupation: Disabled, part-time Corporate investment banker    Comment: Disability  Tobacco Use   Smoking status: Some Days    Current packs/day: 0.25    Average packs/day: 0.3 packs/day for 23.0 years (5.8 ttl pk-yrs)    Types:  Cigarettes    Passive exposure: Current   Smokeless tobacco: Never   Tobacco comments:    3 to 4 per day  Vaping Use   Vaping status: Not on file  Substance and Sexual Activity   Alcohol use: Not Currently    Alcohol/week: 1.0 standard drink of alcohol    Types: 1 Cans of beer per week    Comment: On special occasion   Drug use: Yes    Types: Marijuana    Comment: 2 times/week - last use 10/06/20   Sexual activity: Yes  Other Topics Concern   Not on file  Social History Narrative   Fun: Enjoy his children   Social Determinants of Health   Financial Resource Strain: Low Risk  (07/07/2022)   Received from [facility], [facility]   Overall Financial Resource Strain (CARDIA)    Difficulty of Paying Living Expenses: Not hard at all  Food Insecurity: No Food Insecurity (01/17/2023)   Hunger Vital Sign    Worried About Running Out of Food in the Last Year: Never true    Ran Out of Food in the Last Year: Never true  Transportation Needs: No Transportation Needs (01/17/2023)   PRAPARE - Administrator, Civil Service (Medical): No    Lack of Transportation (Non-Medical): No  Physical Activity: Not on file  Stress: Not on file  Social Connections: Unknown (05/11/2022)   [facility], [facility]   Social Network    Social Network: Not on file  Intimate Partner Violence: Not At Risk (01/17/2023)   Humiliation, Afraid, Rape, and Kick questionnaire    Fear of Current or Ex-Partner: No    Emotionally Abused: No    Physically Abused: No    Sexually Abused: No     Family History  Problem Relation Age of Onset   Hypertension Mother    Hypertension Other    Colon cancer Paternal Uncle    Stroke Maternal Aunt    Heart attack Brother    Hypertension Brother    Diabetes Maternal Aunt    Lung cancer Maternal Uncle    Stroke Maternal Uncle    Other Sister        "breathing machine at night"   Headache Sister  Thyroid disease Sister    Throat  cancer Neg Hx    Pancreatic cancer Neg Hx    Esophageal cancer Neg Hx    Kidney disease Neg Hx    Liver disease Neg Hx     ROS: Otherwise negative unless mentioned in HPI  Physical Examination  Vitals:   01/18/23 0755 01/18/23 0907  BP:  (!) 155/90  Pulse: (!) 107   Resp: 17   Temp:    SpO2: 96%    Body mass index is 21.37 kg/m.  General:  WDWN in NAD Gait: Not observed HENT: WNL, normocephalic Pulmonary: normal non-labored breathing, without Rales, rhonchi,  wheezing Cardiac: regular Abdomen:  soft, NT/ND, no masses Skin: without rashes Vascular Exam/Pulses: Palpable radial pulses Extremities: Left brachiocephalic fistula with a palpable thrill throughout the upper arm; no sign of infection Musculoskeletal: no muscle wasting or atrophy  Neurologic: A&O X 3;  No focal weakness or paresthesias are detected; speech is fluent/normal Psychiatric:  The pt has Normal affect. Lymph:  Unremarkable  CBC    Component Value Date/Time   WBC 36.0 (H) 01/18/2023 0236   RBC 2.71 (L) 01/18/2023 0236   HGB 8.5 (L) 01/18/2023 0236   HGB 12.2 (L) 06/22/2017 1220   HCT 25.8 (L) 01/18/2023 0236   HCT 38.1 06/22/2017 1220   PLT 239 01/18/2023 0236   PLT 156 06/22/2017 1220   MCV 95.2 01/18/2023 0236   MCV 93 06/22/2017 1220   MCH 31.4 01/18/2023 0236   MCHC 32.9 01/18/2023 0236   RDW 15.7 (H) 01/18/2023 0236   RDW 14.9 06/22/2017 1220   LYMPHSABS 1.4 01/18/2023 0236   LYMPHSABS 1.4 06/22/2017 1220   MONOABS 2.4 (H) 01/18/2023 0236   EOSABS 0.1 01/18/2023 0236   EOSABS 0.1 06/22/2017 1220   BASOSABS 0.1 01/18/2023 0236   BASOSABS 0.0 06/22/2017 1220    BMET    Component Value Date/Time   NA 135 01/18/2023 0236   NA 140 11/02/2019 0927   K 4.6 01/18/2023 0236   CL 98 01/18/2023 0236   CO2 22 01/18/2023 0236   GLUCOSE 87 01/18/2023 0236   BUN 112 (H) 01/18/2023 0236   BUN 27 (H) 11/02/2019 0927   CREATININE 9.28 (H) 01/18/2023 0236   CREATININE 1.52 (H) 06/08/2014  1132   CALCIUM 9.1 01/18/2023 0236   GFRNONAA 6 (L) 01/18/2023 0236   GFRAA 24 (L) 11/26/2019 0532    COAGS: Lab Results  Component Value Date   INR 1.2 01/17/2023   INR 1.3 (H) 01/09/2023   INR 1.1 05/17/2020     Non-Invasive Vascular Imaging:   Patent innominate vein stent on CT without surrounding fluid collection or air    ASSESSMENT/PLAN: This is a 52 y.o. male status post innominate vein angioplasty and stenting during fistulogram 3 weeks ago  Mr. Bingaman is a 52 year old male who is complaining of constant chest pain radiating to his back since his fistulogram 3 weeks ago involving innominate vein angioplasty and stenting.  Fistula has a palpable thrill throughout the upper arm and is functioning well for dialysis.  There is no sign of infection on exam.  Surgical history is also significant for aortic root replacement with deep branching and thoracic aneurysm repair at [facility] in 2018.  He has an elevated white count and recent bacteremia.  He is being treated for possible endocarditis.  He has a nuclear stress test scheduled in the morning.  He is also being treated conservatively for NSTEMI.  On CT, innominate  vein stent is patent and there is no fluid collection or free air to suggest infection.  On-call vascular surgeon Dr. Chestine Spore will evaluate the patient later today and provide further treatment plans.   Emilie Rutter PA-C [facility] [phone]  I have seen and evaluated the patient. I agree with the PA note as documented above.  52 year old male previously had a open repair of an Extent III TAAA secondary to chronic dissection procedure with a # 22mm Vascutek "Coselli" multibranch Dacron graft with individual celiac, SMA, and bilateral renal artery implantation on 01/04/2017. (Prior Bio-Bentall and total arch replacement followed by TEVAR for distal arch, proximal descending, and aortic root aneurysm repair in setting of chronic Type B dissection  09/2014) at [facility].  He is known to our practice for end-stage renal disease and recently underwent a left brachiobasilic intervention.  This required stenting of his left innominate occlusion by Dr. Lenell Antu last month.  He states he has been having chest and back pain that started after this procedure.  I did review his CT scan and I do not see any fluid around the stent or other malposition of the stent in the left innominate.  The fistula has an excellent thrill in his left arm.  He is quite uncomfortable on exam complaining of chest and back pain and has elevated troponins.  We are happy to follow but I do not see any obvious etiology as it relates to his innominate stent but will discuss with Dr. Lenell Antu.  Cephus Shelling, MD [facility] [place] Office: [phone]

## 2023-01-18 NOTE — Consult Note (Signed)
[facility] Renal Consultation Note    Indication for Consultation:  Management of ESRD/hemodialysis, anemia, hypertension/volume, and secondary hyperparathyroidism.  HPI: [name] is a 52 y.o. male with PMH including ESRD on dialysis, HTN, CAD, HLD, CVA, thoracis aortc aneurysma, and aortic valve replacement who was recently admitted with bacteremia. He was discharged on 01/14/23. At that time, blooc cultures were positive and he was started on HD three times per week for IV ancef through 02/22/23. Patient reports he had been compliant with dialysis after discharge. He presented to the ED again with chest pain and sepsis. Found to have worsening leukocytosis and antibiotics changed to vancomycin and ceftriaxone, repeat Bcx pending. He also reports chest pain that comes and goes. He was started on IV heparin. Cardiology recommended avoiding invasive work up for now due to ongoing sepsis and started aspirin TID for possible pericarditis. Nephrology was consulted for management of ESRD. At present, patient reports he is still having chest pain. Says it seems to come and go and "ramps up" with deep breaths. He reports mild fever/chills. Denies SOB, dizziness, abdominal pain, N/V/D.   Past Medical History:  Diagnosis Date   Adenomatous colon polyp 08/2015   Anxiety    Aortic disease (HCC)    Aortic dissection (HCC)    a. admx 04/2014 >> L renal infarct; a/c renal failure >> b.  s/p Bioprosthetic Bentall and total arch replacement and staged endovascular repair of descending aortic aneurysm ([facility] - Dr. Kizzie Bane)   CAD (coronary artery disease)    a. LHC 4/16:  oD1 60%   Cardiomyopathy (HCC)    a. non-ischemic - probably related to untreated HTN and ETOH abuse - Echo 3/13 with EF 35-40% >> b. Echo 4/16: Severe LVH, EF 55-60%, moderate AI, moderate MR, mild LAE, trivial effusion, known type B dissection with communication between true and false lumens with suprasternal images suggesting  dissection plane may propagate to at least left subclavian takeoff, root above aortic valve okay     Chronic abdominal pain    Chronic combined systolic and diastolic congestive heart failure (HCC)    a. 05/2011: Adm with pulm edema/HTN urgency, EF 35-40% with diffuse hypokinesis and moderate to severe mitral regurgitation. Cardiomyopathy likely due to uncontrolled HTN and ETOH abuse - cath deferred due to renal insufficiency (felt due to uncontrolled HTN). bElzie Rings MV 06/2011: EF 37% and no ischemia or infarction. c. EF 45-50% by echo 01/2012.   Chronic sinusitis    DDD (degenerative disc disease), lumbar    Depression    Descending thoracic aortic aneurysm (HCC)    Dissecting aneurysm of thoracic aorta (HCC)    ESRD (end stage renal disease) (HCC)    on dialysis 1-2 days per week (2 days ordered, only coming 1 day per week as of oct 2024). on dialysis 3-4 years total   ETOH abuse    a. Reported to have quit 05/2011.   Frequent headaches    GERD (gastroesophageal reflux disease)    Headache(784.0)    "q other day" (08/08/2013)   Heart murmur    Hemorrhoid thrombosis    History of echocardiogram    Echo 1/17:  Severe LVH, EF 55-60%, no RWMA, Gr 2 DD, AVR ok, mild to mod MR, mild LAE, mild reduced RVSF, mod RAE   History of medication noncompliance    HYPERLIPIDEMIA    Hypertension    a. Hx of HTN urgency secondary to noncompliance. b. urinary metanephrine and catecholeamine levels normal 2013.  c. Renal art [name] 1/16:  No evidence of renal artery stenosis noted bilaterally.   INGUINAL HERNIA    Pneumonia ~ 2013   Serrated adenoma of colon 08/2015   Stroke [facility])    Tobacco abuse    Valvular heart disease    a. Echo 05/2011: moderate to severe eccentric MR and mild to moderate AI with prolapsing left coronary cusp. b. Echo 01/2012: mild-mod AI, mild dilitation of aortic root, mild MR.;  c. Echo 1/16: Severe LVH consistent with hypertrophic cardio myopathy, EF 50%, no RWMA, mod AI, mild MR,  mild RAE, dilated Ao root (40 mm);    Past Surgical History:  Procedure Laterality Date   A/V FISTULAGRAM Left 03/07/2021   Procedure: A/V FISTULAGRAM;  Surgeon: Chuck Hint, MD;  Location: [facility] INVASIVE CV LAB;  Service: Cardiovascular;  Laterality: Left;   A/V FISTULAGRAM Left 07/18/2021   Procedure: A/V Fistulagram;  Surgeon: Chuck Hint, MD;  Location: [facility] INVASIVE CV LAB;  Service: Cardiovascular;  Laterality: Left;   A/V FISTULAGRAM Left 12/25/2022   Procedure: A/V Fistulagram;  Surgeon: Leonie Douglas, MD;  Location: [facility] INVASIVE CV LAB;  Service: Cardiovascular;  Laterality: Left;   ANKLE SURGERY Bilateral    Fractures bilaterally   AORTIC VALVE SURGERY  09/2014   AV FISTULA PLACEMENT Left 09/09/2020   Procedure: LEFT ARM ARTERIOVENOUS (AV) FISTULA CREATION;  Surgeon: Leonie Douglas, MD;  Location: [facility] OR;  Service: Vascular;  Laterality: Left;   BASCILIC VEIN TRANSPOSITION Left 10/09/2020   Procedure: LEFT SECOND STAGE BASILIC VEIN TRANSPOSITION;  Surgeon: Leonie Douglas, MD;  Location: [facility] OR;  Service: Vascular;  Laterality: Left;  PERIPHERAL NERVE BLOCK   CORONARY ANGIOGRAPHY N/A 12/06/2017   Procedure: CORONARY ANGIOGRAPHY;  Surgeon: Lyn Records, MD;  Location: [facility] INVASIVE CV LAB;  Service: Cardiovascular;  Laterality: N/A;   CORONARY ANGIOGRAPHY N/A 09/26/2018   Procedure: CORONARY ANGIOGRAPHY;  Surgeon: Yvonne Kendall, MD;  Location: [facility] INVASIVE CV LAB;  Service: Cardiovascular;  Laterality: N/A;   CORONARY STENT INTERVENTION N/A 12/10/2017   Procedure: CORONARY STENT INTERVENTION;  Surgeon: Lennette Bihari, MD;  Location: [facility] INVASIVE CV LAB;  Service: Cardiovascular;  Laterality: N/A;   FOOT FRACTURE SURGERY Bilateral 2004-2010   "got pins in both of them"   HEMORRHOID SURGERY N/A 06/15/2015   Procedure: HEMORRHOIDECTOMY;  Surgeon: Almond Lint, MD;  Location: [facility] OR;  Service: General;  Laterality: N/A;   INGUINAL HERNIA REPAIR Right ~ 1996   INSERTION OF  DIALYSIS CATHETER N/A 09/09/2020   Procedure: INSERTION OF TUNNELED DIALYSIS PALINDROME 19cm CATHETER;  Surgeon: Leonie Douglas, MD;  Location: [facility] OR;  Service: Vascular;  Laterality: N/A;   LEFT HEART CATH AND CORONARY ANGIOGRAPHY N/A 02/05/2021   Procedure: LEFT HEART CATH AND CORONARY ANGIOGRAPHY;  Surgeon: Laurey Morale, MD;  Location: [facility] INVASIVE CV LAB;  Service: Cardiovascular;  Laterality: N/A;   LEFT HEART CATHETERIZATION WITH CORONARY ANGIOGRAM N/A 06/21/2014   Procedure: LEFT HEART CATHETERIZATION WITH CORONARY ANGIOGRAM;  Surgeon: Laurey Morale, MD;  Location: [facility] CATH LAB;  Service: Cardiovascular;  Laterality: N/A;   LOOP RECORDER INSERTION N/A 06/19/2016   Procedure: Loop Recorder Insertion;  Surgeon: [facility] Salvia, MD;  Location: [facility] INVASIVE CV LAB;  Service: Cardiovascular;  Laterality: N/A;   PERIPHERAL VASCULAR BALLOON ANGIOPLASTY  03/07/2021   Procedure: PERIPHERAL VASCULAR BALLOON ANGIOPLASTY;  Surgeon: Chuck Hint, MD;  Location: [facility] INVASIVE CV LAB;  Service: Cardiovascular;;   PERIPHERAL VASCULAR BALLOON ANGIOPLASTY  07/18/2021   Procedure: PERIPHERAL VASCULAR BALLOON ANGIOPLASTY;  Surgeon: Chuck Hint, MD;  Location: [facility] INVASIVE CV LAB;  Service: Cardiovascular;;   PERIPHERAL VASCULAR INTERVENTION Left 12/25/2022   Procedure: PERIPHERAL VASCULAR INTERVENTION;  Surgeon: Leonie Douglas, MD;  Location: [facility] INVASIVE CV LAB;  Service: Cardiovascular;  Laterality: Left;  stent to brachiocephalic vein   PERIPHERAL VASCULAR ULTRASOUND/IVUS Left 12/25/2022   Procedure: Peripheral Vascular Ultrasound/IVUS;  Surgeon: Leonie Douglas, MD;  Location: [facility] INVASIVE CV LAB;  Service: Cardiovascular;  Laterality: Left;   TEE WITHOUT CARDIOVERSION N/A 03/12/2016   Procedure: TRANSESOPHAGEAL ECHOCARDIOGRAM (TEE);  Surgeon: Thurmon Fair, MD;  Location: [facility] ENDOSCOPY;  Service: Cardiovascular;  Laterality: N/A;   TEE WITHOUT CARDIOVERSION N/A 10/16/2020   Procedure:  TRANSESOPHAGEAL ECHOCARDIOGRAM (TEE);  Surgeon: Laurey Morale, MD;  Location: [facility] ENDOSCOPY;  Service: Cardiovascular;  Laterality: N/A;   TRANSESOPHAGEAL ECHOCARDIOGRAM (CATH LAB) N/A 01/13/2023   Procedure: TRANSESOPHAGEAL ECHOCARDIOGRAM;  Surgeon: Quintella Reichert, MD;  Location: [facility] INVASIVE CV LAB;  Service: Cardiovascular;  Laterality: N/A;   UPPER EXTREMITY VENOGRAPHY Right 07/18/2021   Procedure: UPPER EXTREMITY VENOGRAPHY;  Surgeon: Chuck Hint, MD;  Location: [facility] INVASIVE CV LAB;  Service: Cardiovascular;  Laterality: Right;   Family History  Problem Relation Age of Onset   Hypertension Mother    Hypertension Other    Colon cancer Paternal Uncle    Stroke Maternal Aunt    Heart attack Brother    Hypertension Brother    Diabetes Maternal Aunt    Lung cancer Maternal Uncle    Stroke Maternal Uncle    Other Sister        "breathing machine at night"   Headache Sister    Thyroid disease Sister    Throat cancer Neg Hx    Pancreatic cancer Neg Hx    Esophageal cancer Neg Hx    Kidney disease Neg Hx    Liver disease Neg Hx    Social History:  reports that he has been smoking cigarettes. He has a 5.8 pack-year smoking history. He has been exposed to tobacco smoke. He has never used smokeless tobacco. He reports that he does not currently use alcohol after a past usage of about 1.0 standard drink of alcohol per week. He reports current drug use. Drug: Marijuana.  ROS: As per HPI otherwise negative.  Review of Systems: Gen: Reports fever, chills, fatigue CV: Reports chest pain. Denies palpitations, orthopnea, PND, peripheral edema Resp: Denies dyspnea at rest, cough, sputum, or wheezing. GI: Denies nausea, vomiting, abdominal pain, diarrhea.  Derm: Denies rashes, itching Heme: Denies abnormal bleeding Neuro: No headache, dizziness. Endocrine: No hypoglycemia or thyroid disease reported  Physical Exam: Vitals:   01/18/23 0130 01/18/23 0417 01/18/23 0755 01/18/23  0907  BP: (!) 157/88 (!) 155/90  (!) 155/90  Pulse:  (!) 106 (!) 107   Resp:  20 17   Temp:  100 F (37.8 C)    TempSrc:  Oral    SpO2:   96%   Weight:      Height:         General: Alert male in NAD Head: Normocephalic, atraumatic, sclera non-icteric, mucus membranes are moist. Lungs: Clear bilaterally to auscultation without wheezes, rales, or rhonchi. Breathing is unlabored. Heart: RRR with normal S1, S2. No murmurs, rubs, or gallops appreciated. Abdomen: Soft,, non-distended with normoactive bowel sounds.  No obvious abdominal masses. Musculoskeletal:  Strength and tone appear normal for age. Lower extremities: No edema bilateral lower extremities Neuro:  Alert and oriented X 3. Moves all extremities spontaneously. Psych:  Responds to questions appropriately with a normal affect. Dialysis Access: LUE AVF + t/b  Allergies  Allergen Reactions   Clonidine Derivatives Nausea And Vomiting and Other (See Comments)    Tablets by mouth = Tolerated  Patches on the skin = Nausea & vomiting after wearing 4-5 days    Imdur [Isosorbide Nitrate] Other (See Comments)    Headaches -- patient instructed to continue taking    Tramadol Other (See Comments)    Headaches    Prior to Admission medications   Medication Sig Start Date End Date Taking? Authorizing Provider  acetaminophen (TYLENOL) 325 MG tablet Take 2 tablets (650 mg total) by mouth every 4 (four) hours as needed. 01/14/23 01/14/24 Yes Adhikari, Willia Craze, MD  albuterol (VENTOLIN HFA) 108 (90 Base) MCG/ACT inhaler Inhale 1 puff into the lungs every 6 (six) hours as needed for wheezing or shortness of breath. 05/24/21  Yes [provider]  amLODipine (NORVASC) 10 MG tablet TAKE 1 TABLET(10 MG) BY MOUTH DAILY Patient taking differently: Take 10 mg by mouth daily. 07/15/21  Yes Laurey Morale, MD  aspirin EC 81 MG EC tablet Take 1 tablet (81 mg total) by mouth daily. 12/12/17  Yes [facility], Roe Rutherford, PA  ceFAZolin (ANCEF) IVPB  Inject 2 g into the vein Every Tuesday,Thursday,and Saturday with dialysis. Indication:  MSSE bacteremia Last Day of Therapy:  02/22/23 Labs - Once weekly:  CBC/D and BMP, Labs - Once weekly: ESR and CRP Method of administration: Per hemodialysis protocol 01/14/23 02/22/23 Yes Adhikari, Willia Craze, MD  Evolocumab (REPATHA SURECLICK) 140 MG/ML SOAJ Inject 140 mg into the skin every 14 (fourteen) days. 10/28/22  Yes Hilty, Lisette Abu, MD  hydrALAZINE (APRESOLINE) 100 MG tablet Take 0.5 tablets (50 mg total) by mouth 3 (three) times daily. 01/14/23  Yes Burnadette Pop, MD  HYDROcodone-acetaminophen (NORCO/VICODIN) 5-325 MG tablet Take 1 tablet by mouth every 6 (six) hours as needed for moderate pain. 12/21/22  Yes Vanetta Mulders, MD  lidocaine-prilocaine (EMLA) cream Apply 1 application. topically as needed (prior to port being accessed). Use on dialysis days (Tues, Thurs and Sat)   Yes [provider]  meclizine (ANTIVERT) 25 MG tablet Take 1 tablet (25 mg total) by mouth 3 (three) times daily as needed for dizziness. 12/21/22  Yes Vanetta Mulders, MD  nitroGLYCERIN (NITROSTAT) 0.4 MG SL tablet Place 1 tablet (0.4 mg total) under the tongue every 5 (five) minutes as needed for chest pain. 01/14/23  Yes Burnadette Pop, MD  pantoprazole (PROTONIX) 40 MG tablet Take 40 mg by mouth daily.   Yes [provider]  polyethylene glycol powder (GLYCOLAX/MIRALAX) 17 GM/SCOOP powder Take 17 g by mouth daily. 01/14/23  Yes Burnadette Pop, MD  predniSONE (DELTASONE) 20 MG tablet Take 2 tablets (40 mg total) by mouth daily with breakfast for 3 days. 01/15/23 01/18/23 Yes Adhikari, Willia Craze, MD  senna-docusate (SENOKOT-S) 8.6-50 MG tablet Take 2 tablets by mouth 2 (two) times daily. 01/14/23  Yes Burnadette Pop, MD  STIOLTO RESPIMAT 2.5-2.5 MCG/ACT AERS Inhale 2 puffs into the lungs at bedtime. 12/29/22  Yes [provider]  oxyCODONE-acetaminophen (PERCOCET) 5-325 MG tablet Take 1 tablet by mouth  every 4 (four) hours as needed for severe pain. Patient not taking: Reported on 01/09/2023 12/25/22 12/25/23  Leonie Douglas, MD   Current Facility-Administered Medications  Medication Dose Route Frequency Provider Last Rate Last Admin   acetaminophen (TYLENOL) tablet 650 mg  650 mg  Oral Q6H PRN Steffanie Rainwater, MD       Or   acetaminophen (TYLENOL) suppository 650 mg  650 mg Rectal Q6H PRN Steffanie Rainwater, MD       amLODipine (NORVASC) tablet 10 mg  10 mg Oral Daily Steffanie Rainwater, MD   10 mg at 01/18/23 0907   arformoterol (BROVANA) nebulizer solution 15 mcg  15 mcg Nebulization BID Steffanie Rainwater, MD   15 mcg at 01/18/23 0755   And   umeclidinium bromide (INCRUSE ELLIPTA) 62.5 MCG/ACT 1 puff  1 puff Inhalation Daily Steffanie Rainwater, MD   1 puff at 01/18/23 0755   aspirin EC tablet 650 mg  650 mg Oral TID Patwardhan, Manish J, MD       cefTRIAXone (ROCEPHIN) 2 g in sodium chloride 0.9 % 100 mL IVPB  2 g Intravenous Q24H Steffanie Rainwater, MD       diclofenac Sodium (VOLTAREN) 1 % topical gel 2 g  2 g Topical QID Zigmund Daniel., MD   2 g at 01/18/23 1419   feeding supplement (NEPRO CARB STEADY) liquid 237 mL  237 mL Oral BID BM Steffanie Rainwater, MD   237 mL at 01/18/23 0908   heparin ADULT infusion 100 units/mL (25000 units/231mL)  1,400 Units/hr Intravenous Continuous Mosetta Anis, RPH 14 mL/hr at 01/18/23 1436 1,400 Units/hr at 01/18/23 1436   hydrALAZINE (APRESOLINE) tablet 50 mg  50 mg Oral TID Steffanie Rainwater, MD   50 mg at 01/18/23 2952   HYDROmorphone (DILAUDID) injection 1 mg  1 mg Intravenous Q3H PRN Zigmund Daniel., MD   1 mg at 01/18/23 1318   meclizine (ANTIVERT) tablet 25 mg  25 mg Oral TID PRN Steffanie Rainwater, MD       ondansetron [facility]) tablet 4 mg  4 mg Oral Q6H PRN Steffanie Rainwater, MD       Or   ondansetron [facility]) injection 4 mg  4 mg Intravenous Q6H PRN Steffanie Rainwater, MD       oxyCODONE (Oxy  IR/ROXICODONE) immediate release tablet 5 mg  5 mg Oral Q6H PRN Steffanie Rainwater, MD   5 mg at 01/18/23 1114   pantoprazole (PROTONIX) EC tablet 40 mg  40 mg Oral BID Patwardhan, Manish J, MD       senna-docusate (Senokot-S) tablet 1 tablet  1 tablet Oral QHS PRN Steffanie Rainwater, MD       [START ON 01/19/2023] vancomycin (VANCOREADY) IVPB 750 mg/150 mL  750 mg Intravenous Q T,Th,Sa-HD [facility]       Labs: Basic Metabolic Panel: Recent Labs  Lab 01/12/23 0925 01/14/23 0847 01/16/23 0643 01/17/23 1643 01/17/23 1656 01/18/23 0236  NA 132*   < > 138 134* 134* 135  K 4.4   < > 3.9 4.6 4.7 4.6  CL 97*   < > 96* 98 99 98  CO2 20*   < > 29 23  --  22  GLUCOSE 167*   < > 117* 84 81 87  BUN 147*   < > 87* 110* 112* 112*  CREATININE 11.45*   < > 7.45* 8.98* 9.80* 9.28*  CALCIUM 10.3   < > 9.8 9.5  --  9.1  PHOS 5.5*  --   --   --   --  6.1*   < > = values in this interval not displayed.   Liver Function Tests: Recent Labs  Lab  01/12/23 0925 01/17/23 1643 01/18/23 0236  AST  --  97*  --   ALT  --  <5  --   ALKPHOS  --  58  --   BILITOT  --  0.8  --   PROT  --  6.2*  --   ALBUMIN 2.9* 2.9* 2.6*   No results for input(s): "LIPASE", "AMYLASE" in the last 168 hours. No results for input(s): "AMMONIA" in the last 168 hours. CBC: Recent Labs  Lab 01/13/23 0748 01/14/23 0847 01/16/23 0643 01/17/23 1643 01/17/23 1656 01/18/23 0236  WBC 11.5* 12.3* 17.7* 31.1*  --  36.0*  NEUTROABS 8.2* 8.3*  --  28.3*  --  31.2*  HGB 10.3* 9.9* 10.2* 9.0* 10.2* 8.5*  HCT 30.4* 29.2* 31.3* 27.8* 30.0* 25.8*  MCV 92.1 93.6 95.7 95.2  --  95.2  PLT 75* 121* 264 275  --  239   Cardiac Enzymes: No results for input(s): "CKTOTAL", "CKMB", "CKMBINDEX", "TROPONINI" in the last 168 hours. CBG: No results for input(s): "GLUCAP" in the last 168 hours. Iron Studies: No results for input(s): "IRON", "TIBC", "TRANSFERRIN", "FERRITIN" in the last 72 hours. Studies/Results: DG Chest Port 1  View  Result Date: 01/17/2023 CLINICAL DATA:  Shortness of breath. Left chest pain and mid upper back pain for 1 week. EXAM: PORTABLE CHEST 1 VIEW COMPARISON:  Radiographs 01/16/2023 and 01/09/2023. Chest CTA 01/16/2023. FINDINGS: 1711 hours. There are extensive postsurgical changes from previous thoracic aorta stent grafting and aortic valve replacement. The heart size and mediastinal contours are stable. Stable mild scarring at the left lung base. The lungs otherwise appear clear. There is no pleural effusion or pneumothorax. Loop recorder and telemetry leads are noted. The bones appear unchanged. IMPRESSION: No evidence of acute cardiopulmonary process. Stable postsurgical changes. Electronically Signed   By: Carey Bullocks M.D.   On: 01/17/2023 17:30    Dialysis Orders:  Tues/Sat [place] - last HD 10/22  4h   500/800   63.7kg   2/2 bath  P4   LUA AVF  Heparin 3000 - Mircera Iv q 2 weeks - Calcitriol PO q HD   Assessment/Plan:  Bacteremia/fever: Recent hospitalization for bacteremia. Was receiving IV ancef with HD, changed to vancomycin and ceftriaxone due to worsening leukocytosis. ID on board Chest pain: Present for 3 weeks. Management per primary MD/cardiology. On heparin and aspirin for possible endocarditis   ESRD:  Was attending HD only once per week, agrees to three times per week for now to receive antibiotics. Continue TTS schedule while in patient.   Hypertension/volume: Euvolemic on exam. BP elevated possibly due to pain, follow trend. Weight is elevated compared to his EDW but not sure if accurate. UFG 2L with HD tomorrow.   Anemia: Hgb 8.4, will order ESA with HD tomorrow. Hold Fe due to infection  Metabolic bone disease: Ca at goal, phos 6.1. Continue calcitriol and binders  Nutrition:  Alb 2.6, will need protein supplements once tolerating PO  Rogers Blocker, PA-C 01/18/2023, 2:53 PM  [facility] Pager: [phone]

## 2023-01-18 NOTE — Progress Notes (Signed)
Patients right AC IV bleeding out. Blood on gown, bed pad and fitted sheet.This RN removed IV, changed gown and all other linens. Patient educated that this is normal, considering pt is receiving heparin gtt. Patients two other IVs secure and clean. No interruption to heparin gtt or nitroglycerin gtt.

## 2023-01-19 ENCOUNTER — Inpatient Hospital Stay (HOSPITAL_COMMUNITY): Payer: 59

## 2023-01-19 ENCOUNTER — Encounter: Payer: Self-pay | Admitting: Gastroenterology

## 2023-01-19 DIAGNOSIS — R7989 Other specified abnormal findings of blood chemistry: Secondary | ICD-10-CM

## 2023-01-19 DIAGNOSIS — I214 Non-ST elevation (NSTEMI) myocardial infarction: Secondary | ICD-10-CM | POA: Diagnosis not present

## 2023-01-19 DIAGNOSIS — R072 Precordial pain: Secondary | ICD-10-CM

## 2023-01-19 LAB — CBC WITH DIFFERENTIAL/PLATELET
Abs Immature Granulocytes: 0 10*3/uL (ref 0.00–0.07)
Basophils Absolute: 0 10*3/uL (ref 0.0–0.1)
Basophils Relative: 0 %
Eosinophils Absolute: 1.1 10*3/uL — ABNORMAL HIGH (ref 0.0–0.5)
Eosinophils Relative: 4 %
HCT: 27.4 % — ABNORMAL LOW (ref 39.0–52.0)
Hemoglobin: 8.8 g/dL — ABNORMAL LOW (ref 13.0–17.0)
Lymphocytes Relative: 0 %
Lymphs Abs: 0 10*3/uL — ABNORMAL LOW (ref 0.7–4.0)
MCH: 30.8 pg (ref 26.0–34.0)
MCHC: 32.1 g/dL (ref 30.0–36.0)
MCV: 95.8 fL (ref 80.0–100.0)
Monocytes Absolute: 0.5 10*3/uL (ref 0.1–1.0)
Monocytes Relative: 2 %
Neutro Abs: 25.3 10*3/uL — ABNORMAL HIGH (ref 1.7–7.7)
Neutrophils Relative %: 94 %
Platelets: 217 10*3/uL (ref 150–400)
RBC: 2.86 MIL/uL — ABNORMAL LOW (ref 4.22–5.81)
RDW: 16.4 % — ABNORMAL HIGH (ref 11.5–15.5)
WBC: 26.9 10*3/uL — ABNORMAL HIGH (ref 4.0–10.5)
nRBC: 0 % (ref 0.0–0.2)
nRBC: 0 /100{WBCs}

## 2023-01-19 LAB — COMPREHENSIVE METABOLIC PANEL
ALT: 5 U/L (ref 0–44)
AST: 70 U/L — ABNORMAL HIGH (ref 15–41)
Albumin: 2.6 g/dL — ABNORMAL LOW (ref 3.5–5.0)
Alkaline Phosphatase: 59 U/L (ref 38–126)
Anion gap: 18 — ABNORMAL HIGH (ref 5–15)
BUN: 115 mg/dL — ABNORMAL HIGH (ref 6–20)
CO2: 20 mmol/L — ABNORMAL LOW (ref 22–32)
Calcium: 9 mg/dL (ref 8.9–10.3)
Chloride: 98 mmol/L (ref 98–111)
Creatinine, Ser: 9.9 mg/dL — ABNORMAL HIGH (ref 0.61–1.24)
GFR, Estimated: 6 mL/min — ABNORMAL LOW (ref >60)
Glucose, Bld: 80 mg/dL (ref 70–99)
Potassium: 4.7 mmol/L (ref 3.5–5.1)
Sodium: 136 mmol/L (ref 135–145)
Total Bilirubin: 0.8 mg/dL (ref <1.2)
Total Protein: 6 g/dL — ABNORMAL LOW (ref 6.5–8.1)

## 2023-01-19 LAB — MAGNESIUM: Magnesium: 2.6 mg/dL — ABNORMAL HIGH (ref 1.7–2.4)

## 2023-01-19 LAB — HEPARIN LEVEL (UNFRACTIONATED): Heparin Unfractionated: 0.27 [IU]/mL — ABNORMAL LOW (ref 0.30–0.70)

## 2023-01-19 LAB — PHOSPHORUS: Phosphorus: 7.6 mg/dL — ABNORMAL HIGH (ref 2.5–4.6)

## 2023-01-19 MED ORDER — ANTICOAGULANT SODIUM CITRATE 4% (200MG/5ML) IV SOLN: 5.0000 mL | Status: DC | PRN

## 2023-01-19 MED ORDER — LIDOCAINE-PRILOCAINE 2.5-2.5 % EX CREA: 1.0000 | TOPICAL_CREAM | CUTANEOUS | Status: DC | PRN

## 2023-01-19 MED ORDER — LIDOCAINE HCL (PF) 1 % IJ SOLN: 5.0000 mL | INTRAMUSCULAR | Status: DC | PRN

## 2023-01-19 MED ORDER — GUAIFENESIN 100 MG/5ML PO LIQD: 5.0000 mL | ORAL | Status: DC | PRN

## 2023-01-19 MED ORDER — HEPARIN BOLUS VIA INFUSION
2000.0000 [IU] | Freq: Once | INTRAVENOUS | Status: AC
  Administered 2023-01-19: 2000 [IU] via INTRAVENOUS
  Filled 2023-01-19: qty 2000

## 2023-01-19 MED ORDER — ALTEPLASE 2 MG IJ SOLR: 2.0000 mg | Freq: Once | INTRAMUSCULAR | Status: DC | PRN

## 2023-01-19 MED ORDER — PENTAFLUOROPROP-TETRAFLUOROETH EX AERO: 1.0000 | INHALATION_SPRAY | CUTANEOUS | Status: DC | PRN

## 2023-01-19 NOTE — Progress Notes (Signed)
PHARMACY - ANTICOAGULATION CONSULT NOTE  Pharmacy Consult for heparin Indication: chest pain/ACS  Allergies  Allergen Reactions   Clonidine Derivatives Nausea And Vomiting and Other (See Comments)    Tablets by mouth = Tolerated  Patches on the skin = Nausea & vomiting after wearing 4-5 days    Imdur [Isosorbide Nitrate] Other (See Comments)    Headaches -- patient instructed to continue taking    Tramadol Other (See Comments)    Headaches     Patient Measurements: Height: 5\' 11"  (180.3 cm) Weight: 69.5 kg (153 lb 3.5 oz) IBW/kg (Calculated) : 75.3 Heparin Dosing Weight: TBW  Vital Signs: Temp: 99 F (37.2 C) (11/04 1935) Temp Source: Oral (11/04 1935) BP: 150/83 (11/04 2022) Pulse Rate: 108 (11/04 2020)  Labs: Recent Labs    01/16/23 0643 01/16/23 0930 01/17/23 1643 01/17/23 1656 01/17/23 1850 01/17/23 2307 01/18/23 0034 01/18/23 0236 01/18/23 1307 01/18/23 2249  HGB 10.2*  --  9.0* 10.2*  --   --   --  8.5*  --   --   HCT 31.3*  --  27.8* 30.0*  --   --   --  25.8*  --   --   PLT 264  --  275  --   --   --   --  239  --   --   LABPROT  --   --  15.2  --   --   --   --   --   --   --   INR  --   --  1.2  --   --   --   --   --   --   --   HEPARINUNFRC  --   --   --   --   --   --   --  0.11* 0.10* 0.23*  CREATININE 7.45*  --  8.98* 9.80*  --   --   --  9.28*  --   --   TROPONINIHS 139*   < > 1,215*  --  1,495* 1,562* 1,397*  --   --   --    < > = values in this interval not displayed.    Estimated Creatinine Clearance: 9.4 mL/min (A) (by C-G formula based on SCr of 9.28 mg/dL (H)).   Medical History: Past Medical History:  Diagnosis Date   Adenomatous colon polyp 08/2015   Anxiety    Aortic disease (HCC)    Aortic dissection (HCC)    a. admx 04/2014 >> L renal infarct; a/c renal failure >> b.  s/p Bioprosthetic Bentall and total arch replacement and staged endovascular repair of descending aortic aneurysm ([facility] - Dr. Kizzie Bane)   CAD (coronary artery  disease)    a. LHC 4/16:  oD1 60%   Cardiomyopathy (HCC)    a. non-ischemic - probably related to untreated HTN and ETOH abuse - Echo 3/13 with EF 35-40% >> b. Echo 4/16: Severe LVH, EF 55-60%, moderate AI, moderate MR, mild LAE, trivial effusion, known type B dissection with communication between true and false lumens with suprasternal images suggesting dissection plane may propagate to at least left subclavian takeoff, root above aortic valve okay     Chronic abdominal pain    Chronic combined systolic and diastolic congestive heart failure (HCC)    a. 05/2011: Adm with pulm edema/HTN urgency, EF 35-40% with diffuse hypokinesis and moderate to severe mitral regurgitation. Cardiomyopathy likely due to uncontrolled HTN and ETOH abuse - cath deferred due to  renal insufficiency (felt due to uncontrolled HTN). bElzie Rings MV 06/2011: EF 37% and no ischemia or infarction. c. EF 45-50% by echo 01/2012.   Chronic sinusitis    DDD (degenerative disc disease), lumbar    Depression    Descending thoracic aortic aneurysm (HCC)    Dissecting aneurysm of thoracic aorta (HCC)    ESRD (end stage renal disease) (HCC)    on dialysis 1-2 days per week (2 days ordered, only coming 1 day per week as of oct 2024). on dialysis 3-4 years total   ETOH abuse    a. Reported to have quit 05/2011.   Frequent headaches    GERD (gastroesophageal reflux disease)    Headache(784.0)    "q other day" (08/08/2013)   Heart murmur    Hemorrhoid thrombosis    History of echocardiogram    Echo 1/17:  Severe LVH, EF 55-60%, no RWMA, Gr 2 DD, AVR ok, mild to mod MR, mild LAE, mild reduced RVSF, mod RAE   History of medication noncompliance    HYPERLIPIDEMIA    Hypertension    a. Hx of HTN urgency secondary to noncompliance. b. urinary metanephrine and catecholeamine levels normal 2013.  c. Renal art [name] 1/16:  No evidence of renal artery stenosis noted bilaterally.   INGUINAL HERNIA    Pneumonia ~ 2013   Serrated adenoma of colon  08/2015   Stroke [facility])    Tobacco abuse    Valvular heart disease    a. Echo 05/2011: moderate to severe eccentric MR and mild to moderate AI with prolapsing left coronary cusp. b. Echo 01/2012: mild-mod AI, mild dilitation of aortic root, mild MR.;  c. Echo 1/16: Severe LVH consistent with hypertrophic cardio myopathy, EF 50%, no RWMA, mod AI, mild MR, mild RAE, dilated Ao root (40 mm);     Assessment: 52 YOM presenting with CP, elevated troponin, he is not on anticoagulation PTA, pharmacy to dose heparin.  Heparin level remains subtherapeutic at 0.1, no infusion issues or bleeding noted by RN.  11/5 AM update:  Heparin level sub-therapeutic but trending up  Goal of Therapy:  Heparin level 0.3-0.7 units/ml Monitor platelets by anticoagulation protocol: Yes   Plan:  Heparin 2000 units bolus x1 Increase heparin to 1550 units/hr Recheck heparin level in 8h  Abran [facility], PharmD, BCPS Clinical Pharmacist Phone: [phone]

## 2023-01-19 NOTE — Progress Notes (Signed)
Pt receives out-pt HD at [place] on TTS. Will assist as needed.   Melven Sartorius Renal Navigator [phone]

## 2023-01-19 NOTE — Procedures (Signed)
Patient seen on Hemodialysis. BP (!) 151/80 (BP Location: Right Arm)   Pulse 95   Temp 98.4 F (36.9 C)   Resp 18   Ht 5\' 11"  (1.803 m)   Wt 69.5 kg   SpO2 94%   BMI 21.37 kg/m   QB 400, UF goal 2L Tolerating treatment with complaints of chest/back pain at this time.   Zetta Bills MD [facility]. Office # [phone] Pager # [phone] 9:25 AM

## 2023-01-19 NOTE — Progress Notes (Signed)
Notified Dr Joneen Roach of coughing up a small amount of blood.

## 2023-01-19 NOTE — Progress Notes (Signed)
Patient Name: [name] Date of Encounter: 01/19/2023 [facility] Cardiologist: Marca Ancona, MD   Interval Summary  .    Seen HD, reports episodes of hemoptysis this morning. Still with ongoing chest pain, worse with hiccups this morning   Vital Signs .    Vitals:   01/19/23 0845 01/19/23 0900 01/19/23 0930 01/19/23 0945  BP: (!) 151/80 (!) 163/80 (!) 143/85 (!) 143/87  Pulse: 95 97 (!) 106 (!) 103  Resp: 18 16 19 15   Temp:      TempSrc:      SpO2: 94% 97% 96% 95%  Weight:      Height:        Intake/Output Summary (Last 24 hours) at 01/19/2023 1001 Last data filed at 01/19/2023 0445 Gross per 24 hour  Intake 693.94 ml  Output 1050 ml  Net -356.06 ml      01/19/2023    8:23 AM 01/17/2023    4:19 PM 01/14/2023    2:23 AM  Last 3 Weights  Weight (lbs) -- 153 lb 3.5 oz 153 lb 3.5 oz  Weight (kg) -- 69.5 kg 69.5 kg      Telemetry/ECG    Sinus tachycardia - Personally Reviewed  Physical Exam .   GEN: No acute distress.   Neck: No JVD Cardiac: RRR, + murmur, no rubs, or gallops.  Respiratory: Clear to auscultation bilaterally. GI: Soft, nontender, non-distended  MS: No edema  Assessment & Plan .     [name] is a 52 y.o. male with a hx of NICM, HTN, ETOH use, CAD s/p NSTEMI PCI to RI '19, ascending aortic dissection s/p Bentall with AVR and arch repair '16 ([facility]) with staged endovascular repair of descending aortic aneurysm, ESRD on HD, thrombocytopenia, CVA who is being seen 01/18/2023 for the evaluation of chest pain/elevated troponin at the request of Dr. Lowell Guitar.   NSTEMI  CAD s/p prior DES to [place] Possible pericarditis? -- high-sensitivity troponin 1215>>1495>>1562>>1397, EKG showed ST, LVH, nonspecific changes. Reports continuous on-going chest pain despite IV NTG, stating only morphine and dilaudid help with pain. Recent TEE with normal LVEF and RV. Possibly demand ischemia in the setting of acute illness/bacteremia though does have CAD  hx. -- given his resistant bacteremia, would not consider a candidate for invasive work up at this time. Favor continued medical therapy.  -- Reviewed with MD, concern for possible pericarditis, need to rule out ischemia therefore planned for lexiscan myoview. This was initially planned for today but he will be in HD for several hours and also pending MRI. Will reschedule for tomorrow. He was placed on high dose ASA 650mg  TID. Discussed MD regarding continuation of IV heparin with high dose ASA with hemoptysis. Will DC heparin.  -- Coxsackie B/B pending -- on ASA, repatha (PTA), norvasc, hydralazine    Sepsis Recent diagnosis of bacteremia -- recent admission with gram positive bacteremia on IV cefazolin PTA. WBC up 31>>36. Lactic 0.7. Reported he was not completing full HD sessions PTA -- currently tachycardic and hypertensive -- switched to vancomycin and rocephin  -- BC NTD -- ID following   ESRD on HD S/p vein angioplasty/stenting -- reports going to HD only once a week prior to recent admission. Now on TTS schedule to get antibiotics -- per nephrology -- reports he developed symptoms after his vascular procedure. VVS consult yesterday, no obvious etiology for concerns    Vascular disease S/p Bentall with AVR/arch repair/repair of descending aortic aneurysm '16 -- CT  chest/abd/pelvis stable with aneurysmal dilatation of abdominal aorta 3.2cm   HTN -- blood pressures elevated -- on IV NTG, norvasc and hydralazine    Per Primary COPD GERD Tobacco use  For questions or updates, please contact [facility] Please consult [email or web address] for contact info under        Signed, Laverda Page, NP

## 2023-01-19 NOTE — Progress Notes (Signed)
Suture removed per Dr. Eliane Decree order.  Patient tolerated well.  Stacie Glaze, LPN-KDU

## 2023-01-19 NOTE — Progress Notes (Signed)
PROGRESS NOTE    [name]  UJW:[id number] DOB: [birth year] DOA: 01/17/2023 PCP: Loura Back, NP  Chief Complaint  Patient presents with   Chest Pain    Brief Narrative:   [name] is [name] 52 y.o. male with medical history significant of HTN, HLD, coronary artery disease status post CABG, MIs, CVA, ESRD on HD once [place] [facility] week, GERD, descending thoracic aortic aneurysm, aortic valve replacement with bioprosthetic valve, severe left jugular venous distention, tobacco use and recent bacteremia presents today for evaluation of ongoing chest pain admitted for NSTEMI and sepsis of unknown source.  Assessment & Plan:   Principal Problem:   NSTEMI (non-ST elevated myocardial infarction) [facility]) Active Problems:   Sepsis (HCC)   Sepsis Hx of bacteremia Patient with [place]row Sanzo recent MSSE bacteremia without clear source TEE 10/30 without obvious vegetation He was discharged on cefazolin with plan for 6 weeks IV antibiotics during dialysis Leukocytosis worsened here to 30's, recurrent fever to 100 degrees F -> abx broadened to vancomycin/ceftriaxone CT chest abdomen pelvis without acute findings MRI L spine 10/28 without obvious discitis/osteo -> will get MRI T/C spine here (results pending). Repeat blood cultures pending from 11/3 (Ngx2)  L Sided Chest pain  Back Pain  Abdominal Discomfort Unclear cause - he notes his symptoms all started after L brachiocephalic arteriovenous angioplasty and stenting on 10/11.   His CT 11/2 did not show any acute findings (stable postsurgical changes in thoracic and abdominal aorta, stable aneurysmal dilatation of distal abdominal aorta.  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  Pulmonary arterial hypertension. Will ask vascular to see if they think any of his symptoms could be related to angioplasty/stenting 10/11? No obvious etiology identified, Dr. Chestine Spore planning to discuss with Dr. Lenell Antu.  Appreciate assistance.   Hemoptysis Small volume  this AM, ASA and heparin on hold, will continue to monitor   NSTEMI  Pleuritic Chest Pain  Concern for Possible Pericarditis Significantly elevated troponin in setting of chest pain and ESRD Appreciate cardiology assistance - recommending continued medical management, planning for nuclear stress test (11/6).  Heparin gtt d/c'd and aspirin held in setting of possible hemoptysis.   ESRD on HD Usually gets HD once [place] Scaff week on Tuesdays but has been going 3 times weekly in order to receive his IV antibiotics for the bacteremia. -Resume HD next week   HTN home amlodipine and hydralazine   Chronic HFpEF TEE on 10/30 showed EF 60-65%, severe LVH, severely dilated LA, moderately dilated RA, moderate mitral valve regurg, and moderate stenosis of the aortic valve prosthetic. Appears euvolemic    # CAD status post CABG # HLD -Repatha every 14 days -ASA 81 mg daily   # COPD -Resume home inhalers   # GERD -Continue Protonix   #Tobacco use disorder States he has cut down to about 1 cigarette/day.  -Encouraged complete smoking cessation    DVT prophylaxis: heparin gtt Code Status: full Family Communication: none Disposition:   Status is: Inpatient Remains inpatient appropriate because: need for inpatient care   Consultants:  Vascular Cardiology ID  Procedures:  none  Antimicrobials:  Anti-infectives (From admission, onward)    Start     Dose/Rate Route Frequency Ordered Stop   01/19/23 1200  vancomycin (VANCOREADY) IVPB 750 mg/150 mL        750 mg 150 mL/hr over 60 Minutes Intravenous Every T-Th-Sa (Hemodialysis) 01/17/23 2217     01/18/23 1800  cefTRIAXone (ROCEPHIN) 2 g in sodium chloride 0.9 % 100 mL  IVPB        2 g 200 mL/hr over 30 Minutes Intravenous Every 24 hours 01/17/23 2210     01/17/23 2215  cefTRIAXone (ROCEPHIN) 1 g in sodium chloride 0.9 % 100 mL IVPB        1 g 200 mL/hr over 30 Minutes Intravenous  Once 01/17/23 2208 01/18/23 0737   01/17/23 1800   vancomycin (VANCOCIN) IVPB 1000 mg/200 mL premix  Status:  Discontinued        1,000 mg 200 mL/hr over 60 Minutes Intravenous  Once 01/17/23 1752 01/17/23 1755   01/17/23 1800  cefTRIAXone (ROCEPHIN) 1 g in sodium chloride 0.9 % 100 mL IVPB        1 g 200 mL/hr over 30 Minutes Intravenous  Once 01/17/23 1752 01/17/23 1850   01/17/23 1800  vancomycin (VANCOREADY) IVPB 1500 mg/300 mL        1,500 mg 150 mL/hr over 120 Minutes Intravenous  Once 01/17/23 1755 01/17/23 2046       Subjective: Continued CP   Objective: Vitals:   01/19/23 1222 01/19/23 1226 01/19/23 1415 01/19/23 2000  BP: (!) 148/74 (!) 157/87 128/72   Pulse: 96 97 (!) 107 (!) 107  Resp: 18 16 17 16   Temp: 98.2 F (36.8 C)  98.2 F (36.8 C)   TempSrc: Oral  Oral   SpO2: 99% 98% 98% 100%  Weight:      Height:        Intake/Output Summary (Last 24 hours) at 01/19/2023 2041 Last data filed at 01/19/2023 1900 Gross per 24 hour  Intake 483.24 ml  Output 3075 ml  Net -2591.76 ml   Filed Weights   01/17/23 1619  Weight: 69.5 kg    Examination:  General: No acute distress. Cardiovascular: RRR Lungs: unlabored Abdomen: Soft, nontender, nondistended Neurological: Alert and oriented 3. Moves all extremities 4 with equal strength. Cranial nerves II through XII grossly intact. Extremities: No clubbing or cyanosis. No edema.   Data Reviewed: I have personally reviewed following labs and imaging studies  CBC: Recent Labs  Lab 01/13/23 0748 01/14/23 0847 01/16/23 0643 01/17/23 1643 01/17/23 1656 01/18/23 0236 01/19/23 0613  WBC 11.5* 12.3* 17.7* 31.1*  --  36.0* 26.9*  NEUTROABS 8.2* 8.3*  --  28.3*  --  31.2* 25.3*  HGB 10.3* 9.9* 10.2* 9.0* 10.2* 8.5* 8.8*  HCT 30.4* 29.2* 31.3* 27.8* 30.0* 25.8* 27.4*  MCV 92.1 93.6 95.7 95.2  --  95.2 95.8  PLT 75* 121* 264 275  --  239 217    Basic Metabolic Panel: Recent Labs  Lab 01/14/23 0847 01/16/23 0643 01/17/23 1643 01/17/23 1656 01/18/23 0236  01/19/23 0613  NA 133* 138 134* 134* 135 136  K 3.8 3.9 4.6 4.7 4.6 4.7  CL 97* 96* 98 99 98 98  CO2 25 29 23   --  22 20*  GLUCOSE 105* 117* 84 81 87 80  BUN 110* 87* 110* 112* 112* 115*  CREATININE 8.97* 7.45* 8.98* 9.80* 9.28* 9.90*  CALCIUM 10.0 9.8 9.5  --  9.1 9.0  MG  --   --   --   --   --  2.6*  PHOS  --   --   --   --  6.1* 7.6*    GFR: Estimated Creatinine Clearance: 8.8 mL/min (Emelio Schneller) (by C-G formula based on SCr of 9.9 mg/dL (H)).  Liver Function Tests: Recent Labs  Lab 01/17/23 1643 01/18/23 0236 01/19/23 0613  AST 97*  --  70*  ALT <5  --  5  ALKPHOS 58  --  59  BILITOT 0.8  --  0.8  PROT 6.2*  --  6.0*  ALBUMIN 2.9* 2.6* 2.6*    CBG: No results for input(s): "GLUCAP" in the last 168 hours.   Recent Results (from the past 240 hour(s))  Culture, blood (Routine X 2) w Reflex to ID Panel     Status: None   Collection Time: 01/11/23  6:11 AM   Specimen: BLOOD  Result Value Ref Range Status   Specimen Description BLOOD BLOOD RIGHT ARM  Final   Special Requests   Final    BOTTLES DRAWN AEROBIC AND ANAEROBIC Blood Culture adequate volume   Culture   Final    NO GROWTH 5 DAYS Performed at [facility], [address], [place], [place] [address]    Report Status 01/16/2023 FINAL  Final  Culture, blood (Routine X 2) w Reflex to ID Panel     Status: None   Collection Time: 01/11/23  6:17 AM   Specimen: BLOOD  Result Value Ref Range Status   Specimen Description BLOOD BLOOD RIGHT HAND  Final   Special Requests   Final    BOTTLES DRAWN AEROBIC AND ANAEROBIC Blood Culture adequate volume   Culture   Final    NO GROWTH 5 DAYS Performed at [facility], [address], [place], [place] [address]    Report Status 01/16/2023 FINAL  Final  Culture, blood (Routine x 2)     Status: None (Preliminary result)   Collection Time: 01/17/23  4:23 PM   Specimen: BLOOD  Result Value Ref Range Status   Specimen Description BLOOD RIGHT ANTECUBITAL  Final    Special Requests   Final    BOTTLES DRAWN AEROBIC AND ANAEROBIC Blood Culture adequate volume   Culture   Final    NO GROWTH 2 DAYS Performed at [facility], [address], [place], [place] [address]    Report Status PENDING  Incomplete  Culture, blood (Routine x 2)     Status: None (Preliminary result)   Collection Time: 01/17/23  4:28 PM   Specimen: BLOOD RIGHT HAND  Result Value Ref Range Status   Specimen Description BLOOD RIGHT HAND  Final   Special Requests   Final    BOTTLES DRAWN AEROBIC AND ANAEROBIC Blood Culture results may not be optimal due to an excessive volume of blood received in culture bottles   Culture   Final    NO GROWTH 2 DAYS Performed at [facility], [address] [place], [place] [address]    Report Status PENDING  Incomplete         Radiology Studies: DG CHEST PORT 1 VIEW  Result Date: 01/18/2023 CLINICAL DATA:  Shortness of breath. EXAM: PORTABLE CHEST 1 VIEW COMPARISON:  Chest radiograph dated 01/17/2023. FINDINGS: Is cardiomegaly with vascular congestion. Left lung base atelectasis or scarring. Trace left pleural effusion. No pneumothorax. Stent graft repair of the thoracic aorta and Zariya Minner loop recorder device. No acute osseous pathology. IMPRESSION: Cardiomegaly with vascular congestion. Electronically Signed   By: Elgie Collard M.D.   On: 01/18/2023 18:58        Scheduled Meds:  acetaminophen  1,000 mg Oral Q8H   amLODipine  10 mg Oral Daily   arformoterol  15 mcg Nebulization BID   And   umeclidinium bromide  1 puff Inhalation Daily   aspirin EC  650 mg  Oral TID   Chlorhexidine Gluconate Cloth  6 each Topical Q0600   diclofenac Sodium  2 g Topical QID   feeding supplement (NEPRO CARB STEADY)  237 mL Oral BID BM   hydrALAZINE  50 mg Oral TID   pantoprazole  40 mg Oral BID   Continuous Infusions:  cefTRIAXone (ROCEPHIN)  IV 2 g (01/19/23 1826)   vancomycin Stopped (01/19/23 1204)     LOS: 2 days    Time spent: over  30 min     Lacretia Nicks, MD [facility]   To contact the attending provider between 7A-7P or the covering provider during after hours 7P-7A, please log into the web site [email or web address] and access using universal [place] password for that web site. If you do not have the password, please call the hospital operator.  01/19/2023, 8:41 PM

## 2023-01-19 NOTE — Progress Notes (Addendum)
Received patient in bed to unit.  Alert and oriented.  Informed consent signed and in chart.   TX duration:3.5 Hours  Patient tolerated well.  Transported back to the room  Alert, without acute distress.  Hand-off given to patient's nurse.   Access used: Left Upper Arm Fistula Access issues: none  Total UF removed: 2L Medication(s) given: Dilaudid and Vancomycin   01/19/23 1222  Vitals  Temp 98.2 F (36.8 C)  Temp Source Oral  BP (!) 148/74  MAP (mmHg) 96  Pulse Rate 96  ECG Heart Rate 97  Resp 18  Oxygen Therapy  SpO2 99 %  O2 Device Nasal Cannula  O2 Flow Rate (L/min) 3 L/min  During Treatment Monitoring  Duration of HD Treatment -hour(s) 3.5 hour(s)  HD Safety Checks Performed Yes  Intra-Hemodialysis Comments Tx completed;Tolerated well  Dialysis Fluid Bolus Normal Saline  Bolus Amount (mL) 300 mL  Fistula / Graft Left Upper arm Arteriovenous fistula  Placement Date/Time: 09/09/20 1117   Orientation: Left  Access Location: (c) Upper arm  Access Type: Arteriovenous fistula  Status Deaccessed     Stacie Glaze LPN [facility]

## 2023-01-19 NOTE — Plan of Care (Signed)

## 2023-01-19 NOTE — Plan of Care (Signed)
  Problem: Education: Goal: Knowledge of General Education information will improve Description: Including pain rating scale, medication(s)/side effects and non-pharmacologic comfort measures Outcome: Progressing   Problem: Coping: Goal: Level of anxiety will decrease Outcome: Not Progressing   Problem: Pain Management: Goal: General experience of comfort will improve Outcome: Not Progressing

## 2023-01-19 NOTE — Progress Notes (Signed)
   01/19/23 1450  TOC Brief Assessment  Insurance and Status Reviewed  Patient has primary care physician Yes  Home environment has been reviewed home w spouse  Prior level of function: independent  Prior/Current Home Services No current home services  Social Determinants of Health Reivew SDOH reviewed no interventions necessary  Readmission risk has been reviewed Yes  Transition of care needs no transition of care needs at this time

## 2023-01-20 ENCOUNTER — Inpatient Hospital Stay (HOSPITAL_COMMUNITY): Payer: 59

## 2023-01-20 DIAGNOSIS — A419 Sepsis, unspecified organism: Secondary | ICD-10-CM | POA: Diagnosis not present

## 2023-01-20 DIAGNOSIS — D72829 Elevated white blood cell count, unspecified: Secondary | ICD-10-CM | POA: Diagnosis not present

## 2023-01-20 DIAGNOSIS — R079 Chest pain, unspecified: Secondary | ICD-10-CM

## 2023-01-20 DIAGNOSIS — N186 End stage renal disease: Secondary | ICD-10-CM | POA: Diagnosis not present

## 2023-01-20 DIAGNOSIS — I214 Non-ST elevation (NSTEMI) myocardial infarction: Secondary | ICD-10-CM | POA: Diagnosis not present

## 2023-01-20 DIAGNOSIS — R7989 Other specified abnormal findings of blood chemistry: Secondary | ICD-10-CM | POA: Diagnosis not present

## 2023-01-20 LAB — COXSACKIE A VIRUS ANTIBODIES
Coxsackie A16 IgG: 1:800 {titer} — ABNORMAL HIGH
Coxsackie A16 IgM: NEGATIVE {titer}
Coxsackie A24 IgG: 1:800 {titer} — ABNORMAL HIGH
Coxsackie A24 IgM: NEGATIVE {titer}
Coxsackie A7 IgG: 1:800 {titer} — ABNORMAL HIGH
Coxsackie A7 IgM: NEGATIVE {titer}
Coxsackie A9 IgG: 1:800 {titer} — ABNORMAL HIGH
Coxsackie A9 IgM: NEGATIVE {titer}

## 2023-01-20 LAB — NM MYOCAR MULTI W/SPECT W/WALL MOTION / EF
Estimated workload: 1
Exercise duration (min): 0 min
Exercise duration (sec): 0 s
MPHR: 170 {beats}/min
Peak HR: 115 {beats}/min
Percent HR: 67 %
Rest HR: 93 {beats}/min

## 2023-01-20 LAB — CBC
HCT: 24.3 % — ABNORMAL LOW (ref 39.0–52.0)
Hemoglobin: 7.9 g/dL — ABNORMAL LOW (ref 13.0–17.0)
MCH: 31.2 pg (ref 26.0–34.0)
MCHC: 32.5 g/dL (ref 30.0–36.0)
MCV: 96 fL (ref 80.0–100.0)
Platelets: 158 10*3/uL (ref 150–400)
RBC: 2.53 MIL/uL — ABNORMAL LOW (ref 4.22–5.81)
RDW: 16.4 % — ABNORMAL HIGH (ref 11.5–15.5)
WBC: 16.1 10*3/uL — ABNORMAL HIGH (ref 4.0–10.5)
nRBC: 0 % (ref 0.0–0.2)

## 2023-01-20 LAB — HEPATITIS B SURFACE ANTIGEN: Hepatitis B Surface Ag: NONREACTIVE

## 2023-01-20 MED ORDER — ALUM & MAG HYDROXIDE-SIMETH 200-200-20 MG/5ML PO SUSP
30.0000 mL | Freq: Once | ORAL | Status: AC
  Administered 2023-01-20: 30 mL via ORAL
  Filled 2023-01-20: qty 30

## 2023-01-20 MED ORDER — TECHNETIUM TC 99M TETROFOSMIN IV KIT
10.0000 | PACK | Freq: Once | INTRAVENOUS | Status: AC | PRN
  Administered 2023-01-20: 10 via INTRAVENOUS

## 2023-01-20 MED ORDER — DARBEPOETIN ALFA 60 MCG/0.3ML IJ SOSY
60.0000 ug | PREFILLED_SYRINGE | INTRAMUSCULAR | Status: DC
  Administered 2023-01-20: 60 ug via SUBCUTANEOUS
  Filled 2023-01-20: qty 0.3

## 2023-01-20 MED ORDER — TECHNETIUM TC 99M TETROFOSMIN IV KIT
31.0000 | PACK | Freq: Once | INTRAVENOUS | Status: AC | PRN
Start: 2023-01-20 — End: 2023-01-20
  Administered 2023-01-20: 31 via INTRAVENOUS

## 2023-01-20 MED ORDER — CHLORHEXIDINE GLUCONATE CLOTH 2 % EX PADS
6.0000 | MEDICATED_PAD | Freq: Every day | CUTANEOUS | Status: DC
  Administered 2023-01-21: 6 via TOPICAL

## 2023-01-20 MED ORDER — CARVEDILOL 6.25 MG PO TABS
6.2500 mg | ORAL_TABLET | Freq: Two times a day (BID) | ORAL | Status: DC
  Administered 2023-01-20 – 2023-01-22 (×5): 6.25 mg via ORAL
  Filled 2023-01-20 (×6): qty 1

## 2023-01-20 MED ORDER — NITROGLYCERIN 0.4 MG SL SUBL
SUBLINGUAL_TABLET | SUBLINGUAL | Status: AC
  Filled 2023-01-20: qty 1

## 2023-01-20 MED ORDER — NITROGLYCERIN 0.4 MG SL SUBL
0.4000 mg | SUBLINGUAL_TABLET | SUBLINGUAL | Status: DC | PRN
  Administered 2023-01-20: 0.4 mg via SUBLINGUAL

## 2023-01-20 MED ORDER — ASPIRIN 81 MG PO TBEC
81.0000 mg | DELAYED_RELEASE_TABLET | Freq: Every day | ORAL | Status: DC
  Administered 2023-01-20 – 2023-01-23 (×4): 81 mg via ORAL
  Filled 2023-01-20 (×4): qty 1

## 2023-01-20 MED ORDER — HYDROMORPHONE HCL 1 MG/ML IJ SOLN
INTRAMUSCULAR | Status: AC
  Filled 2023-01-20: qty 1

## 2023-01-20 MED ORDER — METOCLOPRAMIDE HCL 5 MG PO TABS
5.0000 mg | ORAL_TABLET | Freq: Once | ORAL | Status: AC
  Administered 2023-01-20: 5 mg via ORAL
  Filled 2023-01-20: qty 1

## 2023-01-20 MED ORDER — REGADENOSON 0.4 MG/5ML IV SOLN
INTRAVENOUS | Status: AC
  Filled 2023-01-20: qty 5

## 2023-01-20 MED ORDER — PANTOPRAZOLE SODIUM 40 MG PO TBEC: 40.0000 mg | DELAYED_RELEASE_TABLET | Freq: Every day | ORAL | Status: DC

## 2023-01-20 MED ORDER — REGADENOSON 0.4 MG/5ML IV SOLN
0.4000 mg | Freq: Once | INTRAVENOUS | Status: AC
  Administered 2023-01-20: 0.4 mg via INTRAVENOUS
  Filled 2023-01-20: qty 5

## 2023-01-20 NOTE — Care Management Important Message (Signed)
Important Message  Patient Details  Name: [name] MRN: [id number] Date of Birth: [birth year]   Important Message Given:  Yes - Medicare IM     Dorena Bodo 01/20/2023, 3:37 PM

## 2023-01-20 NOTE — Progress Notes (Signed)
[facility] Progress Note   Subjective:   Pt seen in room, had nuclear stress test this AM. He reports his chest is still hurting. Denies SOB, dizziness, nausea.   Objective Vitals:   01/20/23 0314 01/20/23 0414 01/20/23 0629 01/20/23 0913  BP: 131/79 137/77  (!) 144/78  Pulse: 95 97 99   Resp: 14 14 13    Temp:  98.5 F (36.9 C)    TempSrc:  Oral    SpO2: 100% 100% 100%   Weight:      Height:       Physical Exam General: Alert male in NAD Heart: RRR, no murmurs, rubs or gallops Lungs: CTA bilaterally, respirations unlabored Abdomen: Soft, non-distended, +BS Extremities: No edema b/l lower extremities Dialysis Access: LUE AVF +T/b  Additional Objective Labs: Basic Metabolic Panel: Recent Labs  Lab 01/17/23 1643 01/17/23 1656 01/18/23 0236 01/19/23 0613  NA 134* 134* 135 136  K 4.6 4.7 4.6 4.7  CL 98 99 98 98  CO2 23  --  22 20*  GLUCOSE 84 81 87 80  BUN 110* 112* 112* 115*  CREATININE 8.98* 9.80* 9.28* 9.90*  CALCIUM 9.5  --  9.1 9.0  PHOS  --   --  6.1* 7.6*   Liver Function Tests: Recent Labs  Lab 01/17/23 1643 01/18/23 0236 01/19/23 0613  AST 97*  --  70*  ALT <5  --  5  ALKPHOS 58  --  59  BILITOT 0.8  --  0.8  PROT 6.2*  --  6.0*  ALBUMIN 2.9* 2.6* 2.6*   No results for input(s): "LIPASE", "AMYLASE" in the last 168 hours. CBC: Recent Labs  Lab 01/16/23 0643 01/17/23 1643 01/17/23 1656 01/18/23 0236 01/19/23 0613 01/20/23 0441  WBC 17.7* 31.1*  --  36.0* 26.9* 16.1*  NEUTROABS  --  28.3*  --  31.2* 25.3*  --   HGB 10.2* 9.0*   < > 8.5* 8.8* 7.9*  HCT 31.3* 27.8*   < > 25.8* 27.4* 24.3*  MCV 95.7 95.2  --  95.2 95.8 96.0  PLT 264 275  --  239 217 158   < > = values in this interval not displayed.   Blood Culture    Component Value Date/Time   SDES BLOOD RIGHT HAND 01/17/2023 1628   SPECREQUEST  01/17/2023 1628    BOTTLES DRAWN AEROBIC AND ANAEROBIC Blood Culture results may not be optimal due to an excessive volume of  blood received in culture bottles   CULT  01/17/2023 1628    NO GROWTH 3 DAYS Performed at [facility], [address] [place], [place] [address]    REPTSTATUS PENDING 01/17/2023 1628    Cardiac Enzymes: No results for input(s): "CKTOTAL", "CKMB", "CKMBINDEX", "TROPONINI" in the last 168 hours. CBG: No results for input(s): "GLUCAP" in the last 168 hours. Iron Studies: No results for input(s): "IRON", "TIBC", "TRANSFERRIN", "FERRITIN" in the last 72 hours. @lablastinr3 @ Studies/Results: MR THORACIC SPINE WO CONTRAST  Result Date: 01/20/2023 CLINICAL DATA:  Bacteremia and pain EXAM: MRI CERVICAL AND THORACIC SPINE WITHOUT CONTRAST TECHNIQUE: Multiplanar and multiecho pulse sequences of the cervical spine, to include the craniocervical junction and cervicothoracic junction, and the thoracic spine, were obtained without intravenous contrast. COMPARISON:  None Available. FINDINGS: MRI CERVICAL SPINE FINDINGS Alignment: Reversal of normal cervical lordosis. No static subluxation. Vertebrae: No fracture, evidence of discitis, or bone lesion. Cord: Normal signal and morphology. Posterior Fossa, vertebral arteries, paraspinal tissues: Negative. Disc levels: C1-2: Unremarkable. C2-3:  Small disc bulge. There is no spinal canal stenosis. No neural foraminal stenosis. C3-4: Small disc bulge. There is no spinal canal stenosis. No neural foraminal stenosis. C4-5: Normal disc space and facet joints. There is no spinal canal stenosis. No neural foraminal stenosis. C5-6: Disc desiccation without herniation. There is no spinal canal stenosis. No neural foraminal stenosis. C6-7: Disc desiccation and small left subarticular protrusion. There is no spinal canal stenosis. No neural foraminal stenosis. C7-T1: Normal disc space and facet joints. There is no spinal canal stenosis. No neural foraminal stenosis. MRI THORACIC SPINE FINDINGS Alignment:  Physiologic. Vertebrae: No fracture, evidence of discitis, or bone  lesion. Cord:  Normal signal and morphology. Paraspinal and other soft tissues: Thoracic aortic stent Disc levels: No spinal canal stenosis or nerve root impingement. IMPRESSION: 1. No acute abnormality of the cervical or thoracic spine. 2. Mild cervical degenerative disc disease without spinal canal or neural foraminal stenosis. Electronically Signed   By: Deatra Robinson M.D.   On: 01/20/2023 02:08   MR CERVICAL SPINE WO CONTRAST  Result Date: 01/20/2023 CLINICAL DATA:  Bacteremia and pain EXAM: MRI CERVICAL AND THORACIC SPINE WITHOUT CONTRAST TECHNIQUE: Multiplanar and multiecho pulse sequences of the cervical spine, to include the craniocervical junction and cervicothoracic junction, and the thoracic spine, were obtained without intravenous contrast. COMPARISON:  None Available. FINDINGS: MRI CERVICAL SPINE FINDINGS Alignment: Reversal of normal cervical lordosis. No static subluxation. Vertebrae: No fracture, evidence of discitis, or bone lesion. Cord: Normal signal and morphology. Posterior Fossa, vertebral arteries, paraspinal tissues: Negative. Disc levels: C1-2: Unremarkable. C2-3: Small disc bulge. There is no spinal canal stenosis. No neural foraminal stenosis. C3-4: Small disc bulge. There is no spinal canal stenosis. No neural foraminal stenosis. C4-5: Normal disc space and facet joints. There is no spinal canal stenosis. No neural foraminal stenosis. C5-6: Disc desiccation without herniation. There is no spinal canal stenosis. No neural foraminal stenosis. C6-7: Disc desiccation and small left subarticular protrusion. There is no spinal canal stenosis. No neural foraminal stenosis. C7-T1: Normal disc space and facet joints. There is no spinal canal stenosis. No neural foraminal stenosis. MRI THORACIC SPINE FINDINGS Alignment:  Physiologic. Vertebrae: No fracture, evidence of discitis, or bone lesion. Cord:  Normal signal and morphology. Paraspinal and other soft tissues: Thoracic aortic stent Disc  levels: No spinal canal stenosis or nerve root impingement. IMPRESSION: 1. No acute abnormality of the cervical or thoracic spine. 2. Mild cervical degenerative disc disease without spinal canal or neural foraminal stenosis. Electronically Signed   By: Deatra Robinson M.D.   On: 01/20/2023 02:08   DG CHEST PORT 1 VIEW  Result Date: 01/18/2023 CLINICAL DATA:  Shortness of breath. EXAM: PORTABLE CHEST 1 VIEW COMPARISON:  Chest radiograph dated 01/17/2023. FINDINGS: Is cardiomegaly with vascular congestion. Left lung base atelectasis or scarring. Trace left pleural effusion. No pneumothorax. 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 No acute osseous pathology. IMPRESSION: Cardiomegaly with vascular congestion. Electronically Signed   By: Elgie Collard M.D.   On: 01/18/2023 18:58   Medications:  cefTRIAXone (ROCEPHIN)  IV Stopped (01/19/23 1856)   vancomycin Stopped (01/19/23 1204)    acetaminophen  1,000 mg Oral Q8H   amLODipine  10 mg Oral Daily   arformoterol  15 mcg Nebulization BID   And   umeclidinium bromide  1 puff Inhalation Daily   aspirin EC  650 mg Oral TID   Chlorhexidine Gluconate Cloth  6 each Topical Q0600   diclofenac Sodium  2 g Topical QID   feeding supplement (NEPRO CARB STEADY)  237 mL Oral BID BM   hydrALAZINE  50 mg Oral TID   pantoprazole  40 mg Oral BID   regadenoson  0.4 mg Intravenous Once    Dialysis Orders: Tues/Sat [place] - last HD 10/22  4h   500/800   63.7kg   2/2 bath  P4   LUA AVF  Heparin 3000 - Mircera Iv q 2 weeks - Calcitriol PO q HD  Assessment/Plan:  Bacteremia/fever: Recent hospitalization for bacteremia. Was receiving IV ancef with HD, changed to vancomycin and ceftriaxone due to worsening leukocytosis. ID on board Chest pain: Present for 3 weeks. Management per primary MD/cardiology. On heparin and aspirin for possible pericarditis. Mgt per admitting team. S/p nuclear stress test today  ESRD:  Was attending HD  only once per week, agrees to three times per week for now to receive antibiotics. Continue TTS schedule while in patient.   Hypertension/volume: Euvolemic on exam. BP mostly controlled, continue amlodipine. Weight is elevated compared to his EDW but not sure if accurate. UFG 2L with HD tomorrow.   Anemia: Hgb 7.9, start ESA. Hold Fe due to acute infection  Metabolic bone disease: Ca at goal, phos elevated. Continue calcitriol and binders  Nutrition:  Alb 2.6, continue renal diet and protein supplements  Rogers Blocker, PA-C 01/20/2023, 9:45 AM  [facility] Pager: [phone]

## 2023-01-20 NOTE — Progress Notes (Signed)
ID brief note  Attempted to see patient twice this morning, he was in a procedure room for cardiac procedure  Afebrile in the last 24 hrs  WBC down from 26 to 16.  11/3 blood cx NG in 3 days   11/5 MRI C T spine negative for infective source   Aspirin and heparin stopped 2/2 ? Hemoptysis   On Vancomycin and ceftriaxone Fu Coxsackie serology Will plan to switch back to cefazolin if blood cx stay negative   Odette Fraction, MD Infectious Disease Physician [facility] [address] [place], [place] [address] Phone: [phone]  Fax: [phone]

## 2023-01-20 NOTE — Progress Notes (Signed)
    Patient presented for a nuclear stress test today. No immediate complications. Stress imaging is pending at this time.   Preliminary EKG findings may be listed in the chart, but the stress test result will not be finalized until perfusion imaging is complete.  Corrin Parker, PA-C 01/20/2023 10:24 AM

## 2023-01-20 NOTE — Plan of Care (Signed)

## 2023-01-20 NOTE — Progress Notes (Signed)
[facility] PROGRESS NOTE    Progress Note  [name]  VOZ:[id number] DOB: 29-Dec-1970 DOA: 01/17/2023 PCP: Andre Back, NP     Brief Narrative:   [name] is an 52 y.o. male past medical history significant for essential hypertension coronary artery disease status post CABG, MI end-stage renal disease on hemodialysis once a week descending aortic aneurysm aortic valve replacement bioprosthetic tobacco abuse recent bacteremia comes in for ongoing chest pain admitted for an NSTEMI and sepsis of unknown source   Assessment/Plan:    NSTEMI/pleuritic chest pain concern for possible pericarditis: Significant elevation of his troponins. Cardiology was consulted recommended medical management. Plan for nuclear stress test 01/20/2023, on heparin, aspirin 81.  SIRS: History of MSSA bacteremia without clear source TEE on 01/13/2023 showed no obvious signs of vegetation. He was discharged on 10.31.2024 on cefazolin for plan for 6 weeks of antibiotic during dialysis. Leukocytosis worse, antibiotics broadened to vancomycin and Rocephin. CT scan of the abdomen pelvis showed no acute findings. MRI of the L-spine no discitis or osteomyelitis Blood culture on 01/17/2023 negative till date.  Left-sided chest pain/Ewing pain/abdominal discomfort: Unclear source pain, he relates his symptoms started after left brachiocephalic AV angioplasty and stenting on 12/25/2022. CT scan 01/16/2023 showed no acute findings. Obvious source of pain identified. Vascular surgery evaluated the patient they also did not see any obvious etiology to explain his pain.  Hemoptysis: Heparin were held.  He had no further hemoptysis. Saturations are stable.  End-stage renal disease on hemodialysis once a week: Renal has been consulted continue HD once a week.  Essential hypertension: Continue home dose of amlodipine and hydralazine.  Chronic diastolic heart failure: With a 2D echo on 01/13/2023 showed an  EF of 60% severe LVH severely dilated LA and RA Moderate stenosis of aortic prosthetic valve, he appears euvolemic.  CAD status post CABG heart/hyperlipidemia: Continue Repatha every 14 days, continue aspirin.  COPD: Continue inhalers.  GERD: Continue Protonix.   DVT prophylaxis: lovenox Family Communication:none Status is: Inpatient Remains inpatient appropriate because: Possible NSTEMI    Code Status:     Code Status Orders  (From admission, onward)           Start     Ordered   01/17/23 1942  Full code  Continuous       Question:  By:  Answer:  Consent: discussion documented in EHR   01/17/23 1942           Code Status History     Date Active Date Inactive Code Status Order ID Comments User Context   01/09/2023 2005 01/14/2023 1552 Full Code [id number]  Frankey Shown, DO ED   12/19/2022 1943 12/21/2022 0248 Full Code [id number]  Darlin Drop, DO Inpatient   06/10/2021 1933 06/12/2021 0101 Full Code [id number]  Rehman, Areeg N, DO ED   02/05/2021 1409 02/05/2021 2116 Full Code [id number]  Laurey Morale, MD Inpatient   09/02/2020 1525 09/11/2020 2010 Full Code [id number]  Emeline General, MD ED   05/17/2020 1951 05/19/2020 1932 Full Code [id number]  Orland Mustard, MD ED   11/24/2019 1950 11/26/2019 1800 Full Code [id number]  Briscoe Deutscher, MD ED   09/24/2018 2225 09/28/2018 1754 Full Code [id number]  Briant Cedar, MD Inpatient   09/24/2018 0111 09/24/2018 1216 Full Code [id number]  Hillary Bow, DO ED   12/02/2017 2219 12/11/2017 1324 Full Code [id number]  Beatriz Stallion., PA-C Inpatient   02/28/2016 2320 03/06/2016 1745 Full Code [id number]  Jamie Kato, MD ED   09/07/2015 1730 09/09/2015 1426 Full Code [id number]  Leslye Peer, MD ED   06/21/2014 1424 06/21/2014 2248 Full Code [id number]  Laurey Morale, MD Inpatient   04/24/2014 0023 05/03/2014 2026 Full Code [id number]  Duayne Cal, NP ED   03/27/2014 2110 03/29/2014 0910 Full Code [id number]  Otis Brace, MD  Inpatient   09/11/2013 1451 09/12/2013 2112 Full Code [id number]  Manuela Schwartz Inpatient   08/08/2013 1901 08/09/2013 1540 Full Code [id number]  Jeralyn Bennett, MD Inpatient   05/05/2013 2020 05/06/2013 2110 Full Code [id number]  Ozella Rocks, MD Inpatient   02/20/2013 1552 02/22/2013 1546 Full Code [id number]  Jeralyn Bennett, MD Inpatient   05/22/2011 1705 05/25/2011 1818 Full Code [id number]  Cleora Fleet, MD Inpatient      Advance Directive Documentation    Flowsheet Row Most Recent Value  Type of Advance Directive Healthcare Power of Attorney, Living will  Pre-existing out of facility DNR order (yellow form or pink MOST form) --  "MOST" Form in Place? --         IV Access:   Peripheral IV   Procedures and diagnostic studies:   MR THORACIC SPINE WO CONTRAST  Result Date: 01/20/2023 CLINICAL DATA:  Bacteremia and pain EXAM: MRI CERVICAL AND THORACIC SPINE WITHOUT CONTRAST TECHNIQUE: Multiplanar and multiecho pulse sequences of the cervical spine, to include the craniocervical junction and cervicothoracic junction, and the thoracic spine, were obtained without intravenous contrast. COMPARISON:  None Available. FINDINGS: MRI CERVICAL SPINE FINDINGS Alignment: Reversal of normal cervical lordosis. No static subluxation. Vertebrae: No fracture, evidence of discitis, or bone lesion. Cord: Normal signal and morphology. Posterior Fossa, vertebral arteries, paraspinal tissues: Negative. Disc levels: C1-2: Unremarkable. C2-3: Small disc bulge. There is no spinal canal stenosis. No neural foraminal stenosis. C3-4: Small disc bulge. There is no spinal canal stenosis. No neural foraminal stenosis. C4-5: Normal disc space and facet joints. There is no spinal canal stenosis. No neural foraminal stenosis. C5-6: Disc desiccation without herniation. There is no spinal canal stenosis. No neural foraminal stenosis. C6-7: Disc desiccation and small left subarticular protrusion. There is no  spinal canal stenosis. No neural foraminal stenosis. C7-T1: Normal disc space and facet joints. There is no spinal canal stenosis. No neural foraminal stenosis. MRI THORACIC SPINE FINDINGS Alignment:  Physiologic. Vertebrae: No fracture, evidence of discitis, or bone lesion. Cord:  Normal signal and morphology. Paraspinal and other soft tissues: Thoracic aortic stent Disc levels: No spinal canal stenosis or nerve root impingement. IMPRESSION: 1. No acute abnormality of the cervical or thoracic spine. 2. Mild cervical degenerative disc disease without spinal canal or neural foraminal stenosis. Electronically Signed   By: Deatra Robinson M.D.   On: 01/20/2023 02:08   MR CERVICAL SPINE WO CONTRAST  Result Date: 01/20/2023 CLINICAL DATA:  Bacteremia and pain EXAM: MRI CERVICAL AND THORACIC SPINE WITHOUT CONTRAST TECHNIQUE: Multiplanar and multiecho pulse sequences of the cervical spine, to include the craniocervical junction and cervicothoracic junction, and the thoracic spine, were obtained without intravenous contrast. COMPARISON:  None Available. FINDINGS: MRI CERVICAL SPINE FINDINGS Alignment: Reversal of normal cervical lordosis. No static subluxation. Vertebrae: No fracture, evidence of discitis, or bone lesion. Cord: Normal signal and morphology. Posterior Fossa, vertebral arteries, paraspinal tissues: Negative. Disc levels: C1-2: Unremarkable. C2-3: Small disc bulge. There is no spinal canal stenosis. No neural foraminal stenosis. C3-4: Small disc bulge. There is no spinal canal stenosis. No neural foraminal stenosis. C4-5: Normal disc space and  facet joints. There is no spinal canal stenosis. No neural foraminal stenosis. C5-6: Disc desiccation without herniation. There is no spinal canal stenosis. No neural foraminal stenosis. C6-7: Disc desiccation and small left subarticular protrusion. There is no spinal canal stenosis. No neural foraminal stenosis. C7-T1: Normal disc space and facet joints. There is no  spinal canal stenosis. No neural foraminal stenosis. MRI THORACIC SPINE FINDINGS Alignment:  Physiologic. Vertebrae: No fracture, evidence of discitis, or bone lesion. Cord:  Normal signal and morphology. Paraspinal and other soft tissues: Thoracic aortic stent Disc levels: No spinal canal stenosis or nerve root impingement. IMPRESSION: 1. No acute abnormality of the cervical or thoracic spine. 2. Mild cervical degenerative disc disease without spinal canal or neural foraminal stenosis. Electronically Signed   By: Deatra Robinson M.D.   On: 01/20/2023 02:08   DG CHEST PORT 1 VIEW  Result Date: 01/18/2023 CLINICAL DATA:  Shortness of breath. EXAM: PORTABLE CHEST 1 VIEW COMPARISON:  Chest radiograph dated 01/17/2023. FINDINGS: Is cardiomegaly with vascular congestion. Left lung base atelectasis or scarring. Trace left pleural effusion. No pneumothorax. 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 No acute osseous pathology. IMPRESSION: Cardiomegaly with vascular congestion. Electronically Signed   By: Elgie Collard M.D.   On: 01/18/2023 18:58     Medical Consultants:   None.   Subjective:    [name] relates his chest pain is not improved  Objective:    Vitals:   01/20/23 0951 01/20/23 0953 01/20/23 1001 01/20/23 1007  BP: (!) 146/85 (!) 168/88 (!) 167/98 (!) 165/90  Pulse: (!) 109 (!) 111 (!) 112 (!) 107  Resp:      Temp:      TempSrc:      SpO2: 100% 100% 99% 98%  Weight:      Height:       SpO2: 98 % O2 Flow Rate (L/min): 4 L/min   Intake/Output Summary (Last 24 hours) at 01/20/2023 1017 Last data filed at 01/20/2023 0600 Gross per 24 hour  Intake 483.24 ml  Output 3055 ml  Net -2571.76 ml   Filed Weights   01/17/23 1619  Weight: 69.5 kg    Exam: General exam: In no acute distress. Respiratory system: Good air movement and clear to auscultation. Cardiovascular system: S1 & S2 heard, RRR. No JVD. Gastrointestinal system: Abdomen is  nondistended, soft and nontender.  Extremities: No pedal edema. Skin: No rashes, lesions or ulcers Psychiatry: Judgement and insight appear normal. Mood & affect appropriate.    Data Reviewed:    Labs: Basic Metabolic Panel: Recent Labs  Lab 01/14/23 0847 01/16/23 0643 01/17/23 1643 01/17/23 1656 01/18/23 0236 01/19/23 0613  NA 133* 138 134* 134* 135 136  K 3.8 3.9 4.6 4.7 4.6 4.7  CL 97* 96* 98 99 98 98  CO2 25 29 23   --  22 20*  GLUCOSE 105* 117* 84 81 87 80  BUN 110* 87* 110* 112* 112* 115*  CREATININE 8.97* 7.45* 8.98* 9.80* 9.28* 9.90*  CALCIUM 10.0 9.8 9.5  --  9.1 9.0  MG  --   --   --   --   --  2.6*  PHOS  --   --   --   --  6.1* 7.6*   GFR Estimated Creatinine Clearance: 8.8 mL/min (A) (by C-G formula based on SCr of 9.9 mg/dL (H)). Liver Function Tests: Recent Labs  Lab 01/17/23 1643 01/18/23 0236 01/19/23 0613  AST 97*  --  70*  ALT <5  --  5  ALKPHOS 58  --  59  BILITOT 0.8  --  0.8  PROT 6.2*  --  6.0*  ALBUMIN 2.9* 2.6* 2.6*   No results for input(s): "LIPASE", "AMYLASE" in the last 168 hours. No results for input(s): "AMMONIA" in the last 168 hours. Coagulation profile Recent Labs  Lab 01/17/23 1643  INR 1.2   COVID-19 Labs  No results for input(s): "DDIMER", "FERRITIN", "LDH", "CRP" in the last 72 hours.  Lab Results  Component Value Date   SARSCOV2NAA NEGATIVE 01/09/2023   SARSCOV2NAA NEGATIVE 01/14/2021   SARSCOV2NAA NEGATIVE 09/02/2020   SARSCOV2NAA NEGATIVE 05/17/2020    CBC: Recent Labs  Lab 01/14/23 0847 01/16/23 0643 01/17/23 1643 01/17/23 1656 01/18/23 0236 01/19/23 0613 01/20/23 0441  WBC 12.3* 17.7* 31.1*  --  36.0* 26.9* 16.1*  NEUTROABS 8.3*  --  28.3*  --  31.2* 25.3*  --   HGB 9.9* 10.2* 9.0* 10.2* 8.5* 8.8* 7.9*  HCT 29.2* 31.3* 27.8* 30.0* 25.8* 27.4* 24.3*  MCV 93.6 95.7 95.2  --  95.2 95.8 96.0  PLT 121* 264 275  --  239 217 158   Cardiac Enzymes: No results for input(s): "CKTOTAL", "CKMB",  "CKMBINDEX", "TROPONINI" in the last 168 hours. BNP (last 3 results) No results for input(s): "PROBNP" in the last 8760 hours. CBG: No results for input(s): "GLUCAP" in the last 168 hours. D-Dimer: No results for input(s): "DDIMER" in the last 72 hours. Hgb A1c: No results for input(s): "HGBA1C" in the last 72 hours. Lipid Profile: No results for input(s): "CHOL", "HDL", "LDLCALC", "TRIG", "CHOLHDL", "LDLDIRECT" in the last 72 hours. Thyroid function studies: No results for input(s): "TSH", "T4TOTAL", "T3FREE", "THYROIDAB" in the last 72 hours.  Invalid input(s): "FREET3" Anemia work up: No results for input(s): "VITAMINB12", "FOLATE", "FERRITIN", "TIBC", "IRON", "RETICCTPCT" in the last 72 hours. Sepsis Labs: Recent Labs  Lab 01/17/23 1643 01/17/23 1657 01/18/23 0236 01/19/23 0613 01/20/23 0441  WBC 31.1*  --  36.0* 26.9* 16.1*  LATICACIDVEN  --  0.7  --   --   --    Microbiology Recent Results (from the past 240 hour(s))  Culture, blood (Routine X 2) w Reflex to ID Panel     Status: None   Collection Time: 01/11/23  6:11 AM   Specimen: BLOOD  Result Value Ref Range Status   Specimen Description BLOOD BLOOD RIGHT ARM  Final   Special Requests   Final    BOTTLES DRAWN AEROBIC AND ANAEROBIC Blood Culture adequate volume   Culture   Final    NO GROWTH 5 DAYS Performed at [facility], [address], [place], [place] [address]    Report Status 01/16/2023 FINAL  Final  Culture, blood (Routine X 2) w Reflex to ID Panel     Status: None   Collection Time: 01/11/23  6:17 AM   Specimen: BLOOD  Result Value Ref Range Status   Specimen Description BLOOD BLOOD RIGHT HAND  Final   Special Requests   Final    BOTTLES DRAWN AEROBIC AND ANAEROBIC Blood Culture adequate volume   Culture   Final    NO GROWTH 5 DAYS Performed at [facility], [address], [place], [place] [address]    Report Status 01/16/2023 FINAL  Final  Culture, blood (Routine x 2)     Status:  None (Preliminary result)   Collection Time: 01/17/23  4:23 PM   Specimen: BLOOD  Result Value  Ref Range Status   Specimen Description BLOOD RIGHT ANTECUBITAL  Final   Special Requests   Final    BOTTLES DRAWN AEROBIC AND ANAEROBIC Blood Culture adequate volume   Culture   Final    NO GROWTH 3 DAYS Performed at [facility], [address] [place], [place] [address]    Report Status PENDING  Incomplete  Culture, blood (Routine x 2)     Status: None (Preliminary result)   Collection Time: 01/17/23  4:28 PM   Specimen: BLOOD RIGHT HAND  Result Value Ref Range Status   Specimen Description BLOOD RIGHT HAND  Final   Special Requests   Final    BOTTLES DRAWN AEROBIC AND ANAEROBIC Blood Culture results may not be optimal due to an excessive volume of blood received in culture bottles   Culture   Final    NO GROWTH 3 DAYS Performed at [facility], [address] [place], [place] [address]    Report Status PENDING  Incomplete     Medications:    acetaminophen  1,000 mg Oral Q8H   amLODipine  10 mg Oral Daily   arformoterol  15 mcg Nebulization BID   And   umeclidinium bromide  1 puff Inhalation Daily   aspirin EC  81 mg Oral Daily   Chlorhexidine Gluconate Cloth  6 each Topical Q0600   darbepoetin (ARANESP) injection - DIALYSIS  60 mcg Subcutaneous Q Wed-1800   diclofenac Sodium  2 g Topical QID   feeding supplement (NEPRO CARB STEADY)  237 mL Oral BID BM   hydrALAZINE  50 mg Oral TID   pantoprazole  40 mg Oral BID   Continuous Infusions:  cefTRIAXone (ROCEPHIN)  IV Stopped (01/19/23 1856)   vancomycin Stopped (01/19/23 1204)      LOS: 3 days   Marinda Elk  [facility]  01/20/2023, 10:17 AM

## 2023-01-20 NOTE — Progress Notes (Addendum)
Rounding Note    Patient Name: [name] Date of Encounter: 01/20/2023  [facility] Cardiologist: Marca Ancona, MD   Subjective   Saw patient down in nuclear medicine for his stress test. He was complaining of 7-8/10 chest pain and shortness of breath prior to beginning the test but looked very comfortably on exam. He did have worsening symptoms after administration of Lexiscan. He started complaining of severe >10/10 chest pain but it was very atypical - worse with palpation of chest wall suggestive of musculoskeletal pain. We gave one dose of sublingual Nitroglycerin with no improvement. He stated the only thing that helps his pain is Dilaudid. He did have Dilaudid scheduled so we gave a dose of this with only minimal improvement. I did go back by to check on him while he was waiting to get his stress images done. He was still having chest pain but did report some improvement in symtopms. He was again resting comfortably and did not look to be any distress.    Inpatient Medications    Scheduled Meds:  acetaminophen  1,000 mg Oral Q8H   amLODipine  10 mg Oral Daily   arformoterol  15 mcg Nebulization BID   And   umeclidinium bromide  1 puff Inhalation Daily   aspirin EC  81 mg Oral Daily   carvedilol  6.25 mg Oral BID WC   Chlorhexidine Gluconate Cloth  6 each Topical Q0600   [START ON 01/21/2023] Chlorhexidine Gluconate Cloth  6 each Topical Q0600   darbepoetin (ARANESP) injection - DIALYSIS  60 mcg Subcutaneous Q Wed-1800   diclofenac Sodium  2 g Topical QID   feeding supplement (NEPRO CARB STEADY)  237 mL Oral BID BM   hydrALAZINE  50 mg Oral TID   pantoprazole  40 mg Oral BID   Continuous Infusions:  cefTRIAXone (ROCEPHIN)  IV Stopped (01/19/23 1856)   vancomycin Stopped (01/19/23 1204)   PRN Meds: acetaminophen **FOLLOWED BY** [START ON 01/23/2023] acetaminophen, guaiFENesin, HYDROmorphone (DILAUDID) injection, meclizine, nitroGLYCERIN, ondansetron **OR**  ondansetron (ZOFRAN) IV, oxyCODONE, senna-docusate   Vital Signs    Vitals:   01/20/23 0953 01/20/23 1001 01/20/23 1007 01/20/23 1201  BP: (!) 168/88 (!) 167/98 (!) 165/90 (!) 142/75  Pulse: (!) 111 (!) 112 (!) 107 95  Resp:    16  Temp:    98.4 F (36.9 C)  TempSrc:    Oral  SpO2: 100% 99% 98% 100%  Weight:      Height:        Intake/Output Summary (Last 24 hours) at 01/20/2023 1231 Last data filed at 01/20/2023 0600 Gross per 24 hour  Intake 483.24 ml  Output 1055 ml  Net -571.76 ml      01/19/2023   12:34 PM 01/19/2023    8:23 AM 01/17/2023    4:19 PM  Last 3 Weights  Weight (lbs) -- -- 153 lb 3.5 oz  Weight (kg) -- -- 69.5 kg      Telemetry    Normal sinus rhythm while in nuclear medicine. - Personally Reviewed  ECG    No new ECG tracing today. - Personally Reviewed  Physical Exam   GEN: No acute distress.   Neck: No JVD. Cardiac: RRR. III/VI systolic murmur. Respiratory: Clear to auscultation bilaterally. No wheezes, rhonchi, or rales. MS: No lower extremity edema.  Neuro:  No focal deficits. Psych: Normal affect. Responds appropriately.   Labs    High Sensitivity Troponin:   Recent Labs  Lab 01/16/23 0930  01/17/23 1643 01/17/23 1850 01/17/23 2307 01/18/23 0034  TROPONINIHS 148* 1,215* 1,495* 1,562* 1,397*     Chemistry Recent Labs  Lab 01/17/23 1643 01/17/23 1656 01/18/23 0236 01/19/23 0613  NA 134* 134* 135 136  K 4.6 4.7 4.6 4.7  CL 98 99 98 98  CO2 23  --  22 20*  GLUCOSE 84 81 87 80  BUN 110* 112* 112* 115*  CREATININE 8.98* 9.80* 9.28* 9.90*  CALCIUM 9.5  --  9.1 9.0  MG  --   --   --  2.6*  PROT 6.2*  --   --  6.0*  ALBUMIN 2.9*  --  2.6* 2.6*  AST 97*  --   --  70*  ALT <5  --   --  5  ALKPHOS 58  --   --  59  BILITOT 0.8  --   --  0.8  GFRNONAA 7*  --  6* 6*  ANIONGAP 13  --  15 18*    Lipids No results for input(s): "CHOL", "TRIG", "HDL", "LABVLDL", "LDLCALC", "CHOLHDL" in the last 168 hours.  Hematology Recent Labs   Lab 01/18/23 0236 01/19/23 0613 01/20/23 0441  WBC 36.0* 26.9* 16.1*  RBC 2.71* 2.86* 2.53*  HGB 8.5* 8.8* 7.9*  HCT 25.8* 27.4* 24.3*  MCV 95.2 95.8 96.0  MCH 31.4 30.8 31.2  MCHC 32.9 32.1 32.5  RDW 15.7* 16.4* 16.4*  PLT 239 217 158   Thyroid No results for input(s): "TSH", "FREET4" in the last 168 hours.  BNP Recent Labs  Lab 01/17/23 1643  BNP 1,461.9*    DDimer No results for input(s): "DDIMER" in the last 168 hours.   Radiology    MR THORACIC SPINE WO CONTRAST  Result Date: 01/20/2023 CLINICAL DATA:  Bacteremia and pain EXAM: MRI CERVICAL AND THORACIC SPINE WITHOUT CONTRAST TECHNIQUE: Multiplanar and multiecho pulse sequences of the cervical spine, to include the craniocervical junction and cervicothoracic junction, and the thoracic spine, were obtained without intravenous contrast. COMPARISON:  None Available. FINDINGS: MRI CERVICAL SPINE FINDINGS Alignment: Reversal of normal cervical lordosis. No static subluxation. Vertebrae: No fracture, evidence of discitis, or bone lesion. Cord: Normal signal and morphology. Posterior Fossa, vertebral arteries, paraspinal tissues: Negative. Disc levels: C1-2: Unremarkable. C2-3: Small disc bulge. There is no spinal canal stenosis. No neural foraminal stenosis. C3-4: Small disc bulge. There is no spinal canal stenosis. No neural foraminal stenosis. C4-5: Normal disc space and facet joints. There is no spinal canal stenosis. No neural foraminal stenosis. C5-6: Disc desiccation without herniation. There is no spinal canal stenosis. No neural foraminal stenosis. C6-7: Disc desiccation and small left subarticular protrusion. There is no spinal canal stenosis. No neural foraminal stenosis. C7-T1: Normal disc space and facet joints. There is no spinal canal stenosis. No neural foraminal stenosis. MRI THORACIC SPINE FINDINGS Alignment:  Physiologic. Vertebrae: No fracture, evidence of discitis, or bone lesion. Cord:  Normal signal and morphology.  Paraspinal and other soft tissues: Thoracic aortic stent Disc levels: No spinal canal stenosis or nerve root impingement. IMPRESSION: 1. No acute abnormality of the cervical or thoracic spine. 2. Mild cervical degenerative disc disease without spinal canal or neural foraminal stenosis. Electronically Signed   By: Deatra Robinson M.D.   On: 01/20/2023 02:08   MR CERVICAL SPINE WO CONTRAST  Result Date: 01/20/2023 CLINICAL DATA:  Bacteremia and pain EXAM: MRI CERVICAL AND THORACIC SPINE WITHOUT CONTRAST TECHNIQUE: Multiplanar and multiecho pulse sequences of the cervical spine, to include the craniocervical junction and  cervicothoracic junction, and the thoracic spine, were obtained without intravenous contrast. COMPARISON:  None Available. FINDINGS: MRI CERVICAL SPINE FINDINGS Alignment: Reversal of normal cervical lordosis. No static subluxation. Vertebrae: No fracture, evidence of discitis, or bone lesion. Cord: Normal signal and morphology. Posterior Fossa, vertebral arteries, paraspinal tissues: Negative. Disc levels: C1-2: Unremarkable. C2-3: Small disc bulge. There is no spinal canal stenosis. No neural foraminal stenosis. C3-4: Small disc bulge. There is no spinal canal stenosis. No neural foraminal stenosis. C4-5: Normal disc space and facet joints. There is no spinal canal stenosis. No neural foraminal stenosis. C5-6: Disc desiccation without herniation. There is no spinal canal stenosis. No neural foraminal stenosis. C6-7: Disc desiccation and small left subarticular protrusion. There is no spinal canal stenosis. No neural foraminal stenosis. C7-T1: Normal disc space and facet joints. There is no spinal canal stenosis. No neural foraminal stenosis. MRI THORACIC SPINE FINDINGS Alignment:  Physiologic. Vertebrae: No fracture, evidence of discitis, or bone lesion. Cord:  Normal signal and morphology. Paraspinal and other soft tissues: Thoracic aortic stent Disc levels: No spinal canal stenosis or nerve root  impingement. IMPRESSION: 1. No acute abnormality of the cervical or thoracic spine. 2. Mild cervical degenerative disc disease without spinal canal or neural foraminal stenosis. Electronically Signed   By: Deatra Robinson M.D.   On: 01/20/2023 02:08   DG CHEST PORT 1 VIEW  Result Date: 01/18/2023 CLINICAL DATA:  Shortness of breath. EXAM: PORTABLE CHEST 1 VIEW COMPARISON:  Chest radiograph dated 01/17/2023. FINDINGS: Is cardiomegaly with vascular congestion. Left lung base atelectasis or scarring. Trace left pleural effusion. No pneumothorax. 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 No acute osseous pathology. IMPRESSION: Cardiomegaly with vascular congestion. Electronically Signed   By: Elgie Collard M.D.   On: 01/18/2023 18:58    Cardiac Studies   TTE 01/10/2023: Impressions: 1. Left ventricular ejection fraction, by estimation, is 55 to 60%. The  left ventricle has normal function. The left ventricle has no regional  wall motion abnormalities. There is severe concentric left ventricular  hypertrophy. Left ventricular diastolic   parameters are consistent with Grade II diastolic dysfunction  (pseudonormalization).   2. Right ventricular systolic function is normal. The right ventricular  size is normal. Tricuspid regurgitation signal is inadequate for assessing  PA pressure.   3. Left atrial size was severely dilated.   4. Right atrial size was mild to moderately dilated.   5. The mitral valve is grossly normal. Moderate mitral valve  regurgitation. No evidence of mitral stenosis.   6. Bioprosthetic AoV. Vmax 3.5 m/s, MG 31 mmHG, EOA 0.90cm2, DI 0.26, AT  120 msec. Findings consistent with severe stenosis of the aortic  prosthesis. The aortic valve has been repaired/replaced. Aortic valve  regurgitation is not visualized. Severe  aortic valve stenosis. Procedure Date: 11/2014. Echo findings are  consistent with stenosis of the aortic prosthesis. Aortic valve   acceleration time measures 120 msec.   7. Aortic root repair measures 38 mm. Aortic root/ascending aorta has  been repaired/replaced.   8. The inferior vena cava is normal in size with greater than 50%  respiratory variability, suggesting right atrial pressure of 3 mmHg.   Comparison(s): Changes from prior study are noted. Aortic prosthetic valve  with concerns for severe stenosis on this study.  _______________  TEE 01/13/2023: Impressions:    1. Left ventricular ejection fraction, by estimation, is 60 to 65%. The  left ventricle has normal function. The left ventricle has no regional  wall motion abnormalities. There is severe concentric left ventricular  hypertrophy.   2. Right ventricular systolic function is normal. The right ventricular  size is normal.   3. Left atrial size was severely dilated. No left atrial/left atrial  appendage thrombus was detected.   4. Right atrial size was moderately dilated.   5. The mitral valve is normal in structure. Moderate mitral valve  regurgitation. No evidence of mitral stenosis.   6. The aortic valve has been repaired/replaced. There is moderate  calcification of the aortic valve prosthesis. There is moderate thickening  of the aortic valve prosthesis. Aortic valve regurgitation is trivial..  There is a 27 mm Mitroflow pericardial  valve present in the aortic position. The AVR is not well visualized but  there is at least moderate degeneration of the valve with at least  moderate aortic stenosis by visualization.   7. The inferior vena cava is normal in size with greater than 50%  respiratory variability, suggesting right atrial pressure of 3 mmHg.   8. Aortic root/ascending aorta has been repaired/replaced.    Patient Profile     52 y.o. male with a history of CAD with NSTEMI in 11/2017 s/p DES to RI, chronic HFrEF with recovered EF, ascending aortic dissection s/p Bentall with AVR and arch repair with staged endovascular repair of  descending aortic aneurysm in 2016, hypertension, ESRD on hemodialysis, COPD, GERD, and tobacco abuse. He was recently admitted at the end of 12/2022 for gram positive bacteremia (unclear source) after presenting with chest pain and fever. He presented back to the ED on 01/17/2023 for persistent chest pain for the last 3 weeks. Chest pain sounds very atypical but troponin was found to be elevated. Cardiology consulted for further evaluation.  Assessment & Plan    NSTEMI CAD  Patient has a history of CAD with prior DES to RI in 2019. Last LHC in 01/2021 showed severe stenosis of a small 1st Diag similar to prior cath but otherwise non-obstructive disease. Continued medical therapy was recommended. He now presents with very atypical chest pain that has been constant for the last 3 weeks. EKG showed sinus tachycardia with LVH and some mild ST depression and T wave inversions in lateral leads. High-sensitivity troponin elevated at 1215 >> 1495>> 1562>> 1397. Recent Echo on 01/10/2023 showed LVEF of 55-60% with no regional wall motion abnormalities.  - Continues to have persistent atypical chest pain that is worse with palpitation of chest wall. He states the only thing that helps his pain is Dilaudid.  - Initially treated with high dose Aspirin for possible pericarditis and IV Heparin but these were stopped yesterday due to questionable hemoptysis. Hemoglobin down from 8.8 yesterday to 7/9.  - Will restart Coreg as below.  - On Repatha at home.  - Overall symptoms sound very atypical. He underwent Myoview this morning. He did have some mild worsening of ST depression from baseline in leads V5 and V6 after administration of Lexiscan. Results pending. Further recommendations to follow.   Chronic Combined CHF with Recovered EF LVEF as low as 35-40% in 2013. He was felt to have non-ischemic cardiomyopathy at that time due to untreated HTN and ETOH. EF has subsequently normalized. Last Echo on 01/10/2023 during  recent admission showed LVEF of 55-60% with severe LHV and grade 2 diastolic dysfunction. Prior PYP scan negative for amyloidosis. - Volume status managed by dialysis. - No ACEi/ARB/ ARNI, MRA, or SGLT2 inhibitor given ESRD. - Continue Hydralazine 50mg  three time daily.  -  Previously on Coreg 25mg  twice daily. Unclear why he is no longer on this. Will restart at 6.25mg  twice daily.   S/p Bentall  with AVR/ Arch Repair/ Staged Repair of Descending Aortic Aneurysm  Severe Aortic Stenosis Patient has a history of ascending aortic dissection s/p Bentall with AVR and arch repair with staged endovascular repair of descending aortic aneurysm in 2016. TEE on 01/10/2023 during recent admission for bacteremia showed severe stenosis of aortic prosthesis with mean gradient of 31 mmHg.  TEE on 10/30 showed moderate thickening of aortic valve prosthesis with at least moderate AS. There was no evidence of any vegetations. CTGA this admission showed stable aneurysmal dilatation of abdominal aorta measuring 3.2cm.  - Will restart beta-blocker given history of aortic dissection. - Plans is to consider repeat surgery after he recovers from bacteremia.   Hypertension BP mildly elevated this morning but he had not received medications yet. - Continue Amlodipine 10mg  daily.  - Continue Hydralazine 50mg  three times daily. - Will restart Coreg 6.25mg  twice daily as above.   ESRD on Hemodialysis S/p Vein Angioplasty/ Stenting  He has had some access issues recently and underwent successful recanalization of an occluded left brachiocephalic vein with angioplasty and stenting in 12/25/2022.  Patient reports only going to hemodialysis once a week prior to recent admission.  Now on T/Th/Sat schedule.  - Management per Nephrology.  Otherwise, per primary team: - SIRS: blood cultures negative to date - Recent bacteremia - Hemoptysis  - COPD - GERD - Tobacco abuse   For questions or updates, please contact [facility] Please consult [email or web address] for contact info under        Signed, Corrin Parker, PA-C  01/20/2023, 12:31 PM

## 2023-01-21 DIAGNOSIS — D72829 Elevated white blood cell count, unspecified: Secondary | ICD-10-CM | POA: Diagnosis not present

## 2023-01-21 DIAGNOSIS — I301 Infective pericarditis: Secondary | ICD-10-CM | POA: Insufficient documentation

## 2023-01-21 DIAGNOSIS — I214 Non-ST elevation (NSTEMI) myocardial infarction: Secondary | ICD-10-CM | POA: Diagnosis not present

## 2023-01-21 DIAGNOSIS — R7989 Other specified abnormal findings of blood chemistry: Secondary | ICD-10-CM | POA: Diagnosis not present

## 2023-01-21 LAB — HEPATITIS B SURFACE ANTIBODY, QUANTITATIVE: Hep B S AB Quant (Post): <3.5 m[IU]/mL — ABNORMAL LOW

## 2023-01-21 LAB — CBC
HCT: 24 % — ABNORMAL LOW (ref 39.0–52.0)
Hemoglobin: 7.7 g/dL — ABNORMAL LOW (ref 13.0–17.0)
MCH: 31.4 pg (ref 26.0–34.0)
MCHC: 32.1 g/dL (ref 30.0–36.0)
MCV: 98 fL (ref 80.0–100.0)
Platelets: 141 10*3/uL — ABNORMAL LOW (ref 150–400)
RBC: 2.45 MIL/uL — ABNORMAL LOW (ref 4.22–5.81)
RDW: 16.3 % — ABNORMAL HIGH (ref 11.5–15.5)
WBC: 14.1 10*3/uL — ABNORMAL HIGH (ref 4.0–10.5)
nRBC: 0 % (ref 0.0–0.2)

## 2023-01-21 LAB — RENAL FUNCTION PANEL
Albumin: 2.5 g/dL — ABNORMAL LOW (ref 3.5–5.0)
Anion gap: 14 (ref 5–15)
BUN: 70 mg/dL — ABNORMAL HIGH (ref 6–20)
CO2: 25 mmol/L (ref 22–32)
Calcium: 9.4 mg/dL (ref 8.9–10.3)
Chloride: 98 mmol/L (ref 98–111)
Creatinine, Ser: 8.12 mg/dL — ABNORMAL HIGH (ref 0.61–1.24)
GFR, Estimated: 7 mL/min — ABNORMAL LOW (ref >60)
Glucose, Bld: 78 mg/dL (ref 70–99)
Phosphorus: 6.2 mg/dL — ABNORMAL HIGH (ref 2.5–4.6)
Potassium: 4.7 mmol/L (ref 3.5–5.1)
Sodium: 137 mmol/L (ref 135–145)

## 2023-01-21 LAB — COXSACKIE B VIRUS ANTIBODIES
Coxsackie B1 Ab: 1:500 {titer} — ABNORMAL HIGH
Coxsackie B2 Ab: 1:100 {titer} — ABNORMAL HIGH
Coxsackie B3 Ab: 1:500 {titer} — ABNORMAL HIGH
Coxsackie B4 Ab: 1:500 {titer} — ABNORMAL HIGH
Coxsackie B5 Ab: 1:1000 {titer} — ABNORMAL HIGH
Coxsackie B6 Ab: 1:500 {titer} — ABNORMAL HIGH

## 2023-01-21 MED ORDER — SODIUM CHLORIDE 0.9% FLUSH
10.0000 mL | Freq: Two times a day (BID) | INTRAVENOUS | Status: DC
  Administered 2023-01-21 – 2023-01-22 (×2): 10 mL via INTRAVENOUS

## 2023-01-21 MED ORDER — ASPIRIN 81 MG PO CHEW
81.0000 mg | CHEWABLE_TABLET | ORAL | Status: AC
  Administered 2023-01-22: 81 mg via ORAL
  Filled 2023-01-21: qty 1

## 2023-01-21 MED ORDER — CEFAZOLIN IV (FOR PTA / DISCHARGE USE ONLY): 2.0000 g | INTRAVENOUS | Status: DC

## 2023-01-21 MED ORDER — BACLOFEN 10 MG PO TABS
5.0000 mg | ORAL_TABLET | Freq: Three times a day (TID) | ORAL | Status: DC | PRN
  Administered 2023-01-21 – 2023-01-22 (×2): 5 mg via ORAL
  Filled 2023-01-21 (×5): qty 1

## 2023-01-21 MED ORDER — CEFAZOLIN SODIUM-DEXTROSE 2-4 GM/100ML-% IV SOLN
2.0000 g | INTRAVENOUS | Status: DC
  Administered 2023-01-21 – 2023-01-23 (×2): 2 g via INTRAVENOUS
  Filled 2023-01-21 (×3): qty 100

## 2023-01-21 MED ORDER — MELATONIN 5 MG PO TABS
10.0000 mg | ORAL_TABLET | Freq: Every day | ORAL | Status: DC
  Administered 2023-01-21 – 2023-01-22 (×2): 10 mg via ORAL
  Filled 2023-01-21 (×2): qty 2

## 2023-01-21 NOTE — H&P (View-Only) (Signed)
 Patient Name: [name] Date of Encounter: 01/21/2023 [facility] Cardiologist: Marca Ancona, MD   Interval Summary  .    Continues to have chest pain, reports episode was more severe during his stress test yesterday. Received SL NTG with no improvement, given IV Dilaudid with minimal improvement.   Seen in HD this morning, has the hiccups. Long discussion regarding options that were given by Dr. Rosemary Holms yesterday. He is willing and wants to proceed with diagnostic cardiac catheterization. Has not has recurrent hemoptysis or hematemesis.   Vital Signs .    Vitals:   01/21/23 0100 01/21/23 0300 01/21/23 0826 01/21/23 0834  BP: (!) 157/86 135/68 138/78 (!) 140/76  Pulse: 99 93 93 91  Resp: 18 16 11 15   Temp:  98.5 F (36.9 C) 98.3 F (36.8 C) 98.4 F (36.9 C)  TempSrc:  Oral  Oral  SpO2: 100% 97%  97%  Weight:   68.6 kg   Height:        Intake/Output Summary (Last 24 hours) at 01/21/2023 8119 Last data filed at 01/20/2023 2040 Gross per 24 hour  Intake --  Output 400 ml  Net -400 ml      01/21/2023    8:26 AM 01/19/2023   12:34 PM 01/19/2023    8:23 AM  Last 3 Weights  Weight (lbs) 151 lb 3.8 oz -- --  Weight (kg) 68.6 kg -- --      Telemetry/ECG    Sinus Rhythm - Personally Reviewed  Physical Exam .   GEN: No acute distress.   Neck: No JVD Cardiac: RRR, + systolic murmurs, rubs, or gallops.  Respiratory: Clear to auscultation bilaterally. GI: Soft, nontender, non-distended  MS: No edema  Assessment & Plan .     NSTEMI  CAD s/p prior DES to [place] '19 -- high-sensitivity troponin 1215>>1495>>1562>>1397, EKG showed ST, LVH, nonspecific changes. Reported continuous on-going chest pain despite IV NTG, stating only morphine and dilaudid helped with pain. Recent TEE with normal LVEF and RV. Possibly demand ischemia in the setting of acute illness/bacteremia though does have CAD hx. -- attempted to treat for pericarditis with high dose ASA but  developed hemoptysis/ questionable hematemesis, therefore stopped. -- continues to have chest pain, etiology is unclear as may be MSK and not cardiac in nature -- underwent lexiscan myoview 11/6 showed medium defect with fixed defect in the mid/basal inferolateral location consistent with infarction. MD discussed options with patient yesterday regarding possible diagnostic cath. After long discussion with the patient this morning, he wants to proceed with diagnostic cath with the understanding that PCI may not be an option given his issues with anemia/hemoptysis/hematemesis. Will review with MD and post for cardiac cath tomorrow if in agreement.  -- on ASA, repatha (PTA), norvasc, hydralazine, coreg    Sepsis Recent diagnosis of bacteremia -- recent admission with gram positive bacteremia on IV cefazolin PTA. WBC up 31>>36>>16>>14. Lactic 0.7. Reported he was not completing full HD sessions PTA -- currently tachycardic and hypertensive -- switched to vancomycin and rocephin on admission -- BC NTD -- ID following   ESRD on HD S/p vein angioplasty/stenting -- reports going to HD only once a week prior to recent admission. Now on TTS schedule to get antibiotics -- per nephrology -- reports he developed symptoms after his vascular procedure. VVS consult but no obvious etiology for concerns    Vascular disease S/p Bentall with AVR/arch repair/repair of descending aortic aneurysm '16 Severe Aortic stenosis -- TEE on 01/10/2023  during recent admission for bacteremia showed severe stenosis of aortic prosthesis with mean gradient of 31 mmHg. TEE on 10/30 showed moderate thickening of aortic valve prosthesis with at least moderate AS. There was no evidence of any vegetations.  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

## 2023-01-21 NOTE — Progress Notes (Signed)
[facility] Infectious Diseases Follow Up Note  Patient Identification: Patient Name: [name] MRN: [id number] Admit Date: 01/17/2023  4:15 PM Age: 52 y.o.Today's Date: 01/21/2023  Reason for Visit: Chest pain/bacteremia  Principal Problem:   NSTEMI (non-ST elevated myocardial infarction) [facility]) Active Problems:   ESRD (end stage renal disease) (HCC)   Sepsis (HCC)  Antibiotics:  Vancomycin 11/3- Ceftriaxone 11/3-   Lines/Hardware: LUE AVF  Interval Events: Remains afebrile, WBC down from 16.1-14.1.  Lexiscan yesterday with findings consistent with infarction, intermediate risk.  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 CT 01/16/23 Stable fluid collection adjacent to the operative site in the abdominal aorta. ? Association. Vascular surgery has been consulted with no concerns  - Cardiology consulted: concerns for Hypertensive urgency w/underlying NSTEMI vs pericarditis. High dose aspirin started for concerns for pericarditis but stopped due to hemoptysis.Lexiscan with infarction and plan for possible cath    # Recent MSSE bacteremia with no clear source of infection.  TEE with moderate thickening of the aortic valve prosthesis - Left UE AVF with no signs of infection on exam   Recommendations - Will switch IV abtx to cefazolin with HD, EOT 02/21/23  - Monitor CBC and BMP on abtx  - Given positive coxsackie serology, cause of chest  pain could be viral pericarditis and recommend symptomatic management with NSAIDs+/- colchicine. However this should not exclude work up for ischemic pathology of chest pain if indicated. D/w Primary and Cardiology   - Patient has a fu appt with Dr Thedore Mins on 12/3 at 9 am  - ID will so, please recall if needed   Rest of the management as per the primary team. Thank you for the consult. Please page with pertinent questions or concerns.  ______________________________________________________________________ Subjective patient seen and examined at the bedside.  Getting HD.  No changes in chest pain, started having hiccups in the last 4 to 5 days  Vitals BP (!) 143/77   Pulse 89   Temp 98.4 F (36.9 C) (Oral)   Resp 15   Ht 5\' 11"  (1.803 m)   Wt 68.6 kg   SpO2 100%   BMI 21.09 kg/m      Physical Exam Constitutional: Adult male lying in the bed and getting HD, nontoxic-appearing and appears comfortable    Comments: HEENT wnl   Cardiovascular:     Rate and Rhythm: Normal rate and regular rhythm.     Heart sounds:  Pulmonary:     Effort: Pulmonary effort is normal.     Comments:   Abdominal:     Palpations: Abdomen is nondistended    Tenderness:   Musculoskeletal:        General: No swelling or tenderness in peripheral joints  Skin:    Comments: Left upper extremity aVF with ongoing HD  Neurological:     General: Awake, alert and oriented, following commands  Psychiatric:        Mood and Affect: Mood normal.   Pertinent Microbiology Results for orders placed or performed during the hospital encounter of 01/17/23  Culture, blood (Routine x 2)     Status: None (Preliminary result)   Collection Time: 01/17/23  4:23 PM   Specimen: BLOOD  Result Value Ref Range Status   Specimen Description BLOOD RIGHT ANTECUBITAL  Final   Special Requests   Final    BOTTLES DRAWN AEROBIC AND ANAEROBIC Blood Culture adequate volume   Culture   Final    NO GROWTH 4 DAYS Performed at  [facility], [address], [place], [place] [address]    Report Status PENDING  Incomplete  Culture, blood (Routine x 2)     Status: None (Preliminary result)   Collection Time: 01/17/23  4:28 PM   Specimen: BLOOD RIGHT HAND  Result Value Ref Range Status   Specimen Description BLOOD RIGHT HAND  Final   Special Requests   Final    BOTTLES DRAWN AEROBIC AND ANAEROBIC Blood Culture results may not be optimal due to an excessive volume of blood received in culture bottles   Culture   Final    NO GROWTH 4 DAYS Performed at [facility], [address], [place], [place] [address]    Report Status PENDING  Incomplete    Pertinent Lab.    Latest Ref Rng & Units 01/21/2023    8:37 AM 01/20/2023    4:41 AM 01/19/2023    6:13 AM  CBC  WBC 4.0 - 10.5 K/uL 14.1  16.1  26.9   Hemoglobin 13.0 - 17.0 g/dL 7.7  7.9  8.8   Hematocrit 39.0 - 52.0 % 24.0  24.3  27.4   Platelets 150 - 400 K/uL 141  158  217      Pertinent Imaging today Plain films and CT images have been personally visualized and interpreted; radiology reports have been reviewed. Decision making incorporated into the Impression.  NM Myocar Multi W/Spect W/Wall Motion / EF  Result Date: 01/20/2023   Findings are consistent with infarction. The study is intermediate risk.   ST depression was noted.  Downsloping ST depression noted in leads V5-V6 (worse than baseline). Very mild horizontal depression also noted in lead II.   LV perfusion is abnormal. There is no evidence of ischemia. There is evidence of infarction. Defect 1: There is a medium defect with moderate reduction in uptake present in the mid to basal inferolateral location(s) that is fixed. Consistent with infarction.   MR THORACIC SPINE WO CONTRAST  Result Date: 01/20/2023 CLINICAL DATA:  Bacteremia and pain EXAM: MRI CERVICAL AND THORACIC SPINE WITHOUT CONTRAST TECHNIQUE: Multiplanar and multiecho pulse sequences of the cervical spine, to include the  craniocervical junction and cervicothoracic junction, and the thoracic spine, were obtained without intravenous contrast. COMPARISON:  None Available. FINDINGS: MRI CERVICAL SPINE FINDINGS Alignment: Reversal of normal cervical lordosis. No static subluxation. Vertebrae: No fracture, evidence of discitis, or bone lesion. Cord: Normal signal and morphology. Posterior Fossa, vertebral arteries, paraspinal tissues: Negative. Disc levels: C1-2: Unremarkable. C2-3: Small disc bulge. There is no spinal canal stenosis. No neural foraminal stenosis. C3-4: Small disc bulge. There is no spinal canal stenosis. No neural foraminal stenosis. C4-5: Normal disc space and facet joints. There is no spinal canal stenosis. No neural foraminal stenosis. C5-6: Disc desiccation without herniation. There is no spinal canal stenosis. No neural foraminal stenosis. C6-7: Disc desiccation and small left subarticular protrusion. There is no spinal canal stenosis. No neural foraminal stenosis. C7-T1: Normal disc space and facet joints. There is no spinal canal stenosis. No neural foraminal stenosis. MRI  THORACIC SPINE FINDINGS Alignment:  Physiologic. Vertebrae: No fracture, evidence of discitis, or bone lesion. Cord:  Normal signal and morphology. Paraspinal and other soft tissues: Thoracic aortic stent Disc levels: No spinal canal stenosis or nerve root impingement. IMPRESSION: 1. No acute abnormality of the cervical or thoracic spine. 2. Mild cervical degenerative disc disease without spinal canal or neural foraminal stenosis. Electronically Signed   By: Deatra Robinson M.D.   On: 01/20/2023 02:08   MR CERVICAL SPINE WO CONTRAST  Result Date: 01/20/2023 CLINICAL DATA:  Bacteremia and pain EXAM: MRI CERVICAL AND THORACIC SPINE WITHOUT CONTRAST TECHNIQUE: Multiplanar and multiecho pulse sequences of the cervical spine, to include the craniocervical junction and cervicothoracic junction, and the thoracic spine, were obtained without  intravenous contrast. COMPARISON:  None Available. FINDINGS: MRI CERVICAL SPINE FINDINGS Alignment: Reversal of normal cervical lordosis. No static subluxation. Vertebrae: No fracture, evidence of discitis, or bone lesion. Cord: Normal signal and morphology. Posterior Fossa, vertebral arteries, paraspinal tissues: Negative. Disc levels: C1-2: Unremarkable. C2-3: Small disc bulge. There is no spinal canal stenosis. No neural foraminal stenosis. C3-4: Small disc bulge. There is no spinal canal stenosis. No neural foraminal stenosis. C4-5: Normal disc space and facet joints. There is no spinal canal stenosis. No neural foraminal stenosis. C5-6: Disc desiccation without herniation. There is no spinal canal stenosis. No neural foraminal stenosis. C6-7: Disc desiccation and small left subarticular protrusion. There is no spinal canal stenosis. No neural foraminal stenosis. C7-T1: Normal disc space and facet joints. There is no spinal canal stenosis. No neural foraminal stenosis. MRI THORACIC SPINE FINDINGS Alignment:  Physiologic. Vertebrae: No fracture, evidence of discitis, or bone lesion. Cord:  Normal signal and morphology. Paraspinal and other soft tissues: Thoracic aortic stent Disc levels: No spinal canal stenosis or nerve root impingement. IMPRESSION: 1. No acute abnormality of the cervical or thoracic spine. 2. Mild cervical degenerative disc disease without spinal canal or neural foraminal stenosis. Electronically Signed   By: Deatra Robinson M.D.   On: 01/20/2023 02:08    I have personally spent 51 minutes involved in face-to-face and non-face-to-face activities for this patient on the day of the visit. Professional time spent includes the following activities: Preparing to see the patient (review of tests), Obtaining and/or reviewing separately obtained history (admission/discharge record), Performing a medically appropriate examination and/or evaluation , Ordering medications/tests/procedures, referring and  communicating with other health care professionals, Documenting clinical information in the EMR, Independently interpreting results (not separately reported), Communicating results to the patient/family/caregiver, Counseling and educating the patient/family/caregiver and Care coordination (not separately reported).   Plan d/w requesting provider as well as ID pharm D  Of note, portions of this note may have been created with voice recognition software. While this note has been edited for accuracy, occasional wrong-word or 'sound-a-like' substitutions may have occurred due to the inherent limitations of voice recognition software.   Electronically signed by:   Odette Fraction, MD Infectious Disease Physician [facility] Pager: [phone]

## 2023-01-21 NOTE — Plan of Care (Signed)

## 2023-01-21 NOTE — Progress Notes (Signed)
Pharmacy Antibiotic Note  [name] is a 52 y.o. male admitted on 01/17/2023 with recent MSSA bacteremia - has been on IV ancef with HD since discharge on 10/31- plan was for 6 weeks. ID was been consulted and recommended antibiotics be broadened to Rocephin and Vancomycin Pharmacy has been consulted for Vancomycin dosing. Pt with ESRD (recently changed to T/T/S schedule as op).   -WBC= 16.1, afebrile -blood cultures- ngtd -per ID> plans  to switch back to cefazolin if blood cx stay negative   Plan: Continue Vancomycin 750mg  IV qHD Will f/u HD schedule/tolerance, micro data, and pt's clinical condition    Height: 5\' 11"  (180.3 cm) Weight:  (Unable to obtain due to Alaris pump attached to bed) IBW/kg (Calculated) : 75.3  Temp (24hrs), Avg:98.5 F (36.9 C), Min:98 F (36.7 C), Max:99.2 F (37.3 C)  Recent Labs  Lab 01/16/23 0643 01/17/23 1643 01/17/23 1656 01/17/23 1657 01/18/23 0236 01/19/23 0613 01/20/23 0441  WBC 17.7* 31.1*  --   --  36.0* 26.9* 16.1*  CREATININE 7.45* 8.98* 9.80*  --  9.28* 9.90*  --   LATICACIDVEN  --   --   --  0.7  --   --   --     Estimated Creatinine Clearance: 8.8 mL/min (A) (by C-G formula based on SCr of 9.9 mg/dL (H)).    Allergies  Allergen Reactions   Clonidine Derivatives Nausea And Vomiting and Other (See Comments)    Tablets by mouth = Tolerated  Patches on the skin = Nausea & vomiting after wearing 4-5 days    Imdur [Isosorbide Nitrate] Other (See Comments)    Headaches -- patient instructed to continue taking    Tramadol Other (See Comments)    Headaches     Antimicrobials this admission: 11/3 Vanc >>  11/3 Rocephin >>  Pta Ancef 10/26>>planned for 6 weeks  Microbiology results: 11/3 BCx: ngtd  Thank you for allowing pharmacy to be a part of this patient's care.  Harland German, PharmD Clinical Pharmacist **Pharmacist phone directory can now be found on [email or web address] ([email or web address]).  Listed under [facility].

## 2023-01-21 NOTE — Progress Notes (Signed)
[facility] PROGRESS NOTE    Progress Note  [name]  ZOX:[id number] DOB: Dec 20, 1970 DOA: 01/17/2023 PCP: Loura Back, NP     Brief Narrative:   [name] is an 52 y.o. male past medical history significant for essential hypertension coronary artery disease status post CABG, MI end-stage renal disease on hemodialysis once a week descending aortic aneurysm aortic valve replacement bioprosthetic tobacco abuse recent bacteremia comes in for ongoing chest pain admitted for an NSTEMI and sepsis of unknown source   Assessment/Plan:    NSTEMI/pleuritic chest pain concern for possible pericarditis: Significant elevation of his troponins. Nuclear stress test that was inconclusive, cardiology recommended cardiac cath. Continue aspirin. Further management per cardiology.  SIRS: History of MSSA bacteremia without clear source TEE on 01/13/2023 showed no obvious signs of vegetation. He was discharged on 10.31.2024 on cefazolin for plan for 6 weeks of antibiotic during dialysis. Leukocytosis worsen, leukocytosis is improved we will transition him back to IV cefazolin.. CT scan of the abdomen pelvis showed no acute findings. MRI of the L-spine no discitis or osteomyelitis Blood culture on 01/17/2023 negative till date.  Left-sided chest pain/back pain/abdominal discomfort: Unclear source pain, he relates his symptoms started after left brachiocephalic AV angioplasty and stenting on 12/25/2022. CT scan 01/16/2023 showed no acute findings. No obvious source of pain identified. Vascular surgery evaluated the patient they also did not see any obvious etiology to explain his pain.  Hiccups: Started on baclofen as needed. They have been going on for more than 4 days as per patient.  Hemoptysis: Heparin were held.  He had no further hemoptysis. Saturations are stable.  End-stage renal disease on hemodialysis once a week: Renal has been consulted continue HD once a  week.  Essential hypertension: Continue home dose of amlodipine and hydralazine.  Chronic diastolic heart failure: With a 2D echo on 01/13/2023 showed an EF of 60% severe LVH severely dilated LA and RA Moderate stenosis of aortic prosthetic valve, he appears euvolemic.  CAD status post CABG heart/hyperlipidemia: Continue Repatha every 14 days, continue aspirin.  COPD: Continue inhalers.  GERD: Continue Protonix.   DVT prophylaxis: lovenox Family Communication:none Status is: Inpatient Remains inpatient appropriate because: Possible NSTEMI    Code Status:     Code Status Orders  (From admission, onward)           Start     Ordered   01/17/23 1942  Full code  Continuous       Question:  By:  Answer:  Consent: discussion documented in EHR   01/17/23 1942           Code Status History     Date Active Date Inactive Code Status Order ID Comments User Context   01/09/2023 2005 01/14/2023 1552 Full Code [id number]  Frankey Shown, DO ED   12/19/2022 1943 12/21/2022 0248 Full Code [id number]  Darlin Drop, DO Inpatient   06/10/2021 1933 06/12/2021 0101 Full Code [id number]  Rehman, Areeg N, DO ED   02/05/2021 1409 02/05/2021 2116 Full Code [id number]  Laurey Morale, MD Inpatient   09/02/2020 1525 09/11/2020 2010 Full Code [id number]  Emeline General, MD ED   05/17/2020 1951 05/19/2020 1932 Full Code [id number]  Orland Mustard, MD ED   11/24/2019 1950 11/26/2019 1800 Full Code [id number]  Briscoe Deutscher, MD ED   09/24/2018 2225 09/28/2018 1754 Full Code [id number]  Briant Cedar, MD Inpatient   09/24/2018 0111 09/24/2018 1216 Full Code [id number]  Hillary Bow, DO  ED   12/02/2017 2219 12/11/2017 1324 Full Code [id number]  Beatriz Stallion., PA-C Inpatient   02/28/2016 2320 03/06/2016 1745 Full Code [id number]  Jamie Kato, MD ED   09/07/2015 1730 09/09/2015 1426 Full Code [id number]  Leslye Peer, MD ED   06/21/2014 1424 06/21/2014 2248 Full Code [id number]  Laurey Morale, MD  Inpatient   04/24/2014 0023 05/03/2014 2026 Full Code [id number]  Duayne Cal, NP ED   03/27/2014 2110 03/29/2014 0910 Full Code [id number]  Otis Brace, MD Inpatient   09/11/2013 1451 09/12/2013 2112 Full Code [id number]  Manuela Schwartz Inpatient   08/08/2013 1901 08/09/2013 1540 Full Code [id number]  Jeralyn Bennett, MD Inpatient   05/05/2013 2020 05/06/2013 2110 Full Code [id number]  Ozella Rocks, MD Inpatient   02/20/2013 1552 02/22/2013 1546 Full Code [id number]  Jeralyn Bennett, MD Inpatient   05/22/2011 1705 05/25/2011 1818 Full Code [id number]  Cleora Fleet, MD Inpatient      Advance Directive Documentation    Flowsheet Row Most Recent Value  Type of Advance Directive Healthcare Power of Attorney, Living will  Pre-existing out of facility DNR order (yellow form or pink MOST form) --  "MOST" Form in Place? --         IV Access:   Peripheral IV   Procedures and diagnostic studies:   NM Myocar Multi W/Spect W/Wall Motion / EF  Result Date: 01/20/2023   Findings are consistent with infarction. The study is intermediate risk.   ST depression was noted.  Downsloping ST depression noted in leads V5-V6 (worse than baseline). Very mild horizontal depression also noted in lead II.   LV perfusion is abnormal. There is no evidence of ischemia. There is evidence of infarction. Defect 1: There is a medium defect with moderate reduction in uptake present in the mid to basal inferolateral location(s) that is fixed. Consistent with infarction.   MR THORACIC SPINE WO CONTRAST  Result Date: 01/20/2023 CLINICAL DATA:  Bacteremia and pain EXAM: MRI CERVICAL AND THORACIC SPINE WITHOUT CONTRAST TECHNIQUE: Multiplanar and multiecho pulse sequences of the cervical spine, to include the craniocervical junction and cervicothoracic junction, and the thoracic spine, were obtained without intravenous contrast. COMPARISON:  None Available. FINDINGS: MRI CERVICAL SPINE FINDINGS Alignment: Reversal  of normal cervical lordosis. No static subluxation. Vertebrae: No fracture, evidence of discitis, or bone lesion. Cord: Normal signal and morphology. Posterior Fossa, vertebral arteries, paraspinal tissues: Negative. Disc levels: C1-2: Unremarkable. C2-3: Small disc bulge. There is no spinal canal stenosis. No neural foraminal stenosis. C3-4: Small disc bulge. There is no spinal canal stenosis. No neural foraminal stenosis. C4-5: Normal disc space and facet joints. There is no spinal canal stenosis. No neural foraminal stenosis. C5-6: Disc desiccation without herniation. There is no spinal canal stenosis. No neural foraminal stenosis. C6-7: Disc desiccation and small left subarticular protrusion. There is no spinal canal stenosis. No neural foraminal stenosis. C7-T1: Normal disc space and facet joints. There is no spinal canal stenosis. No neural foraminal stenosis. MRI THORACIC SPINE FINDINGS Alignment:  Physiologic. Vertebrae: No fracture, evidence of discitis, or bone lesion. Cord:  Normal signal and morphology. Paraspinal and other soft tissues: Thoracic aortic stent Disc levels: No spinal canal stenosis or nerve root impingement. IMPRESSION: 1. No acute abnormality of the cervical or thoracic spine. 2. Mild cervical degenerative disc disease without spinal canal or neural foraminal stenosis. Electronically Signed   By: Deatra Robinson M.D.   On: 01/20/2023 02:08   MR CERVICAL SPINE WO CONTRAST  Result Date: 01/20/2023 CLINICAL DATA:  Bacteremia and pain EXAM: MRI CERVICAL AND THORACIC SPINE WITHOUT CONTRAST TECHNIQUE: Multiplanar and multiecho pulse sequences of the cervical spine, to include the craniocervical junction and cervicothoracic junction, and the thoracic spine, were obtained without intravenous contrast. COMPARISON:  None Available. FINDINGS: MRI CERVICAL SPINE FINDINGS Alignment: Reversal of normal cervical lordosis. No static subluxation. Vertebrae: No fracture, evidence of discitis, or bone  lesion. Cord: Normal signal and morphology. Posterior Fossa, vertebral arteries, paraspinal tissues: Negative. Disc levels: C1-2: Unremarkable. C2-3: Small disc bulge. There is no spinal canal stenosis. No neural foraminal stenosis. C3-4: Small disc bulge. There is no spinal canal stenosis. No neural foraminal stenosis. C4-5: Normal disc space and facet joints. There is no spinal canal stenosis. No neural foraminal stenosis. C5-6: Disc desiccation without herniation. There is no spinal canal stenosis. No neural foraminal stenosis. C6-7: Disc desiccation and small left subarticular protrusion. There is no spinal canal stenosis. No neural foraminal stenosis. C7-T1: Normal disc space and facet joints. There is no spinal canal stenosis. No neural foraminal stenosis. MRI THORACIC SPINE FINDINGS Alignment:  Physiologic. Vertebrae: No fracture, evidence of discitis, or bone lesion. Cord:  Normal signal and morphology. Paraspinal and other soft tissues: Thoracic aortic stent Disc levels: No spinal canal stenosis or nerve root impingement. IMPRESSION: 1. No acute abnormality of the cervical or thoracic spine. 2. Mild cervical degenerative disc disease without spinal canal or neural foraminal stenosis. Electronically Signed   By: Deatra Robinson M.D.   On: 01/20/2023 02:08     Medical Consultants:   None.   Subjective:    [name] relates his chest pain is not improved.  Objective:    Vitals:   01/21/23 0834 01/21/23 0900 01/21/23 0930 01/21/23 1000  BP: (!) 140/76 (!) 141/75 127/75 133/73  Pulse: 91 92 88 89  Resp: 15 (!) 24 13 14   Temp: 98.4 F (36.9 C)     TempSrc: Oral     SpO2: 97% 100% 100% 100%  Weight:      Height:       SpO2: 100 % O2 Flow Rate (L/min): 4 L/min   Intake/Output Summary (Last 24 hours) at 01/21/2023 1029 Last data filed at 01/20/2023 2040 Gross per 24 hour  Intake --  Output 400 ml  Net -400 ml   Filed Weights   01/17/23 1619 01/21/23 0826  Weight: 69.5 kg 68.6  kg    Exam: General exam: In no acute distress. Respiratory system: Good air movement and clear to auscultation. Cardiovascular system: S1 & S2 heard, RRR. No JVD. Gastrointestinal system: Abdomen is nondistended, soft and nontender.  Extremities: No pedal edema. Skin: No rashes, lesions or ulcers Psychiatry: Judgement and insight appear normal. Mood & affect appropriate.  Data Reviewed:    Labs: Basic Metabolic Panel: Recent Labs  Lab 01/16/23 0643 01/17/23 1643 01/17/23 1656 01/18/23 0236 01/19/23 0613 01/21/23 0837  NA 138 134* 134* 135 136 137  K 3.9 4.6 4.7 4.6 4.7 4.7  CL 96* 98 99 98 98 98  CO2 29 23  --  22 20* 25  GLUCOSE 117* 84 81 87 80 78  BUN 87* 110* 112* 112* 115* 70*  CREATININE 7.45* 8.98* 9.80* 9.28* 9.90* 8.12*  CALCIUM 9.8 9.5  --  9.1 9.0 9.4  MG  --   --   --   --  2.6*  --   PHOS  --   --   --  6.1* 7.6* 6.2*  GFR Estimated Creatinine Clearance: 10.6 mL/min (A) (by C-G formula based on SCr of 8.12 mg/dL (H)). Liver Function Tests: Recent Labs  Lab 01/17/23 1643 01/18/23 0236 01/19/23 0613 01/21/23 0837  AST 97*  --  70*  --   ALT <5  --  5  --   ALKPHOS 58  --  59  --   BILITOT 0.8  --  0.8  --   PROT 6.2*  --  6.0*  --   ALBUMIN 2.9* 2.6* 2.6* 2.5*   No results for input(s): "LIPASE", "AMYLASE" in the last 168 hours. No results for input(s): "AMMONIA" in the last 168 hours. Coagulation profile Recent Labs  Lab 01/17/23 1643  INR 1.2   COVID-19 Labs  No results for input(s): "DDIMER", "FERRITIN", "LDH", "CRP" in the last 72 hours.  Lab Results  Component Value Date   SARSCOV2NAA NEGATIVE 01/09/2023   SARSCOV2NAA NEGATIVE 01/14/2021   SARSCOV2NAA NEGATIVE 09/02/2020   SARSCOV2NAA NEGATIVE 05/17/2020    CBC: Recent Labs  Lab 01/17/23 1643 01/17/23 1656 01/18/23 0236 01/19/23 0613 01/20/23 0441 01/21/23 0837  WBC 31.1*  --  36.0* 26.9* 16.1* 14.1*  NEUTROABS 28.3*  --  31.2* 25.3*  --   --   HGB 9.0* 10.2* 8.5*  8.8* 7.9* 7.7*  HCT 27.8* 30.0* 25.8* 27.4* 24.3* 24.0*  MCV 95.2  --  95.2 95.8 96.0 98.0  PLT 275  --  239 217 158 141*   Cardiac Enzymes: No results for input(s): "CKTOTAL", "CKMB", "CKMBINDEX", "TROPONINI" in the last 168 hours. BNP (last 3 results) No results for input(s): "PROBNP" in the last 8760 hours. CBG: No results for input(s): "GLUCAP" in the last 168 hours. D-Dimer: No results for input(s): "DDIMER" in the last 72 hours. Hgb A1c: No results for input(s): "HGBA1C" in the last 72 hours. Lipid Profile: No results for input(s): "CHOL", "HDL", "LDLCALC", "TRIG", "CHOLHDL", "LDLDIRECT" in the last 72 hours. Thyroid function studies: No results for input(s): "TSH", "T4TOTAL", "T3FREE", "THYROIDAB" in the last 72 hours.  Invalid input(s): "FREET3" Anemia work up: No results for input(s): "VITAMINB12", "FOLATE", "FERRITIN", "TIBC", "IRON", "RETICCTPCT" in the last 72 hours. Sepsis Labs: Recent Labs  Lab 01/17/23 1657 01/18/23 0236 01/19/23 0613 01/20/23 0441 01/21/23 0837  WBC  --  36.0* 26.9* 16.1* 14.1*  LATICACIDVEN 0.7  --   --   --   --    Microbiology Recent Results (from the past 240 hour(s))  Culture, blood (Routine x 2)     Status: None (Preliminary result)   Collection Time: 01/17/23  4:23 PM   Specimen: BLOOD  Result Value Ref Range Status   Specimen Description BLOOD RIGHT ANTECUBITAL  Final   Special Requests   Final    BOTTLES DRAWN AEROBIC AND ANAEROBIC Blood Culture adequate volume   Culture   Final    NO GROWTH 4 DAYS Performed at [facility], [address] [place], [place] [address]    Report Status PENDING  Incomplete  Culture, blood (Routine x 2)     Status: None (Preliminary result)   Collection Time: 01/17/23  4:28 PM   Specimen: BLOOD RIGHT HAND  Result Value Ref Range Status   Specimen Description BLOOD RIGHT HAND  Final   Special Requests   Final    BOTTLES DRAWN AEROBIC AND ANAEROBIC Blood Culture results may not be optimal  due to an excessive volume of blood received in culture bottles   Culture   Final    NO GROWTH  4 DAYS Performed at [facility], [address] [place], [place] [address]    Report Status PENDING  Incomplete     Medications:    acetaminophen  1,000 mg Oral Q8H   amLODipine  10 mg Oral Daily   arformoterol  15 mcg Nebulization BID   And   umeclidinium bromide  1 puff Inhalation Daily   aspirin EC  81 mg Oral Daily   carvedilol  6.25 mg Oral BID WC   Chlorhexidine Gluconate Cloth  6 each Topical Q0600   Chlorhexidine Gluconate Cloth  6 each Topical Q0600   darbepoetin (ARANESP) injection - DIALYSIS  60 mcg Subcutaneous Q Wed-1800   diclofenac Sodium  2 g Topical QID   feeding supplement (NEPRO CARB STEADY)  237 mL Oral BID BM   hydrALAZINE  50 mg Oral TID   pantoprazole  40 mg Oral BID   Continuous Infusions:   ceFAZolin (ANCEF) IV        LOS: 4 days   Marinda Elk  [facility]  01/21/2023, 10:29 AM

## 2023-01-21 NOTE — Progress Notes (Signed)
Received patient  in bed.Awake,alert and oriented x 4.Consent verified.PIV site bleeding when patient arrived -dressing change done.  Medicines given : Zofran iv 4 mg.                              Ancef 2 g = 200cc  Access used: Left arm AVF that worked well.  Duration of treatment: 3.5 hours.  UF goal: 2L  Tolerated treatment: Yes.  Hand off to the patient's nurse:Back into his room with stable medical condition via transporter.

## 2023-01-21 NOTE — Procedures (Signed)
Patient seen on Hemodialysis. BP 127/75   Pulse 88   Temp 98.4 F (36.9 C) (Oral)   Resp 13   Ht 5\' 11"  (1.803 m)   Wt 68.6 kg   SpO2 100%   BMI 21.09 kg/m   QB 400, UF goal 2L Tolerating treatment with complaints of having hiccups at this time (apparently for the past 5 days or so).   Zetta Bills MD [facility]. Office # [phone] Pager # [phone] 9:54 AM

## 2023-01-21 NOTE — Progress Notes (Addendum)
Patient Name: [name] Date of Encounter: 01/21/2023 [facility] Cardiologist: Marca Ancona, MD   Interval Summary  .    Continues to have chest pain, reports episode was more severe during his stress test yesterday. Received SL NTG with no improvement, given IV Dilaudid with minimal improvement.   Seen in HD this morning, has the hiccups. Long discussion regarding options that were given by Dr. Rosemary Holms yesterday. He is willing and wants to proceed with diagnostic cardiac catheterization. Has not has recurrent hemoptysis or hematemesis.   Vital Signs .    Vitals:   01/21/23 0100 01/21/23 0300 01/21/23 0826 01/21/23 0834  BP: (!) 157/86 135/68 138/78 (!) 140/76  Pulse: 99 93 93 91  Resp: 18 16 11 15   Temp:  98.5 F (36.9 C) 98.3 F (36.8 C) 98.4 F (36.9 C)  TempSrc:  Oral  Oral  SpO2: 100% 97%  97%  Weight:   68.6 kg   Height:        Intake/Output Summary (Last 24 hours) at 01/21/2023 8119 Last data filed at 01/20/2023 2040 Gross per 24 hour  Intake --  Output 400 ml  Net -400 ml      01/21/2023    8:26 AM 01/19/2023   12:34 PM 01/19/2023    8:23 AM  Last 3 Weights  Weight (lbs) 151 lb 3.8 oz -- --  Weight (kg) 68.6 kg -- --      Telemetry/ECG    Sinus Rhythm - Personally Reviewed  Physical Exam .   GEN: No acute distress.   Neck: No JVD Cardiac: RRR, + systolic murmurs, rubs, or gallops.  Respiratory: Clear to auscultation bilaterally. GI: Soft, nontender, non-distended  MS: No edema  Assessment & Plan .     NSTEMI  CAD s/p prior DES to Ramus '19 -- high-sensitivity troponin 1215>>1495>>1562>>1397, EKG showed ST, LVH, nonspecific changes. Reported continuous on-going chest pain despite IV NTG, stating only morphine and dilaudid helped with pain. Recent TEE with normal LVEF and RV. Possibly demand ischemia in the setting of acute illness/bacteremia though does have CAD hx. -- attempted to treat for pericarditis with high dose ASA but  developed hemoptysis/ questionable hematemesis, therefore stopped. -- continues to have chest pain, etiology is unclear as may be MSK and not cardiac in nature -- underwent lexiscan myoview 11/6 showed medium defect with fixed defect in the mid/basal inferolateral location consistent with infarction. MD discussed options with patient yesterday regarding possible diagnostic cath. After long discussion with the patient this morning, he wants to proceed with diagnostic cath with the understanding that PCI may not be an option given his issues with anemia/hemoptysis/hematemesis. Will review with MD and post for cardiac cath tomorrow if in agreement.  -- on ASA, repatha (PTA), norvasc, hydralazine, coreg    Sepsis Recent diagnosis of bacteremia -- recent admission with gram positive bacteremia on IV cefazolin PTA. WBC up 31>>36>>16>>14. Lactic 0.7. Reported he was not completing full HD sessions PTA -- currently tachycardic and hypertensive -- switched to vancomycin and rocephin on admission -- BC NTD -- ID following   ESRD on HD S/p vein angioplasty/stenting -- reports going to HD only once a week prior to recent admission. Now on TTS schedule to get antibiotics -- per nephrology -- reports he developed symptoms after his vascular procedure. VVS consult but no obvious etiology for concerns    Vascular disease S/p [place] with AVR/arch repair/repair of descending aortic aneurysm '16 Severe Aortic stenosis -- TEE on 01/10/2023  during recent admission for bacteremia showed severe stenosis of aortic prosthesis with mean gradient of 31 mmHg. TEE on 10/30 showed moderate thickening of aortic valve prosthesis with at least moderate AS. There was no evidence of any vegetations.  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

## 2023-01-21 NOTE — Progress Notes (Signed)
PHARMACY CONSULT NOTE FOR:  OUTPATIENT  PARENTERAL ANTIBIOTIC THERAPY (OPAT)  This is an information OPAT, nephrology is aware of antibiotics and will coordinate with HD   Indication: MSSE bacteremia  Regimen: Cefazolin 2 g IV TTS with HD  End date: 02/20/23   Thank you for allowing pharmacy to be a part of this patient's care.  Lennie Muckle, PharmD PGY1 Pharmacy Resident 01/21/2023 9:51 AM

## 2023-01-22 ENCOUNTER — Encounter (HOSPITAL_COMMUNITY)
Admission: EM | Disposition: A | Discharge status: Home / Self Care | Payer: Self-pay | Source: Home / Self Care | Attending: Internal Medicine

## 2023-01-22 DIAGNOSIS — R7989 Other specified abnormal findings of blood chemistry: Secondary | ICD-10-CM | POA: Diagnosis not present

## 2023-01-22 DIAGNOSIS — I2510 Atherosclerotic heart disease of native coronary artery without angina pectoris: Secondary | ICD-10-CM | POA: Diagnosis not present

## 2023-01-22 DIAGNOSIS — I301 Infective pericarditis: Secondary | ICD-10-CM | POA: Diagnosis not present

## 2023-01-22 DIAGNOSIS — R072 Precordial pain: Secondary | ICD-10-CM | POA: Diagnosis not present

## 2023-01-22 HISTORY — PX: LEFT HEART CATH AND CORONARY ANGIOGRAPHY: CATH118249

## 2023-01-22 LAB — CBC
HCT: 25.2 % — ABNORMAL LOW (ref 39.0–52.0)
Hemoglobin: 7.9 g/dL — ABNORMAL LOW (ref 13.0–17.0)
MCH: 30.4 pg (ref 26.0–34.0)
MCHC: 31.3 g/dL (ref 30.0–36.0)
MCV: 96.9 fL (ref 80.0–100.0)
Platelets: 138 10*3/uL — ABNORMAL LOW (ref 150–400)
RBC: 2.6 MIL/uL — ABNORMAL LOW (ref 4.22–5.81)
RDW: 15.4 % (ref 11.5–15.5)
WBC: 11.5 10*3/uL — ABNORMAL HIGH (ref 4.0–10.5)
nRBC: 0 % (ref 0.0–0.2)

## 2023-01-22 LAB — BASIC METABOLIC PANEL
Anion gap: 10 (ref 5–15)
BUN: 45 mg/dL — ABNORMAL HIGH (ref 6–20)
CO2: 28 mmol/L (ref 22–32)
Calcium: 8.9 mg/dL (ref 8.9–10.3)
Chloride: 95 mmol/L — ABNORMAL LOW (ref 98–111)
Creatinine, Ser: 5.96 mg/dL — ABNORMAL HIGH (ref 0.61–1.24)
GFR, Estimated: 11 mL/min — ABNORMAL LOW (ref >60)
Glucose, Bld: 94 mg/dL (ref 70–99)
Potassium: 4.4 mmol/L (ref 3.5–5.1)
Sodium: 133 mmol/L — ABNORMAL LOW (ref 135–145)

## 2023-01-22 LAB — CULTURE, BLOOD (ROUTINE X 2)
Culture: NO GROWTH
Culture: NO GROWTH
Special Requests: ADEQUATE

## 2023-01-22 SURGERY — LEFT HEART CATH AND CORONARY ANGIOGRAPHY
Anesthesia: LOCAL

## 2023-01-22 MED ORDER — FENTANYL CITRATE (PF) 100 MCG/2ML IJ SOLN
INTRAMUSCULAR | Status: DC | PRN
  Administered 2023-01-22: 25 ug via INTRAVENOUS

## 2023-01-22 MED ORDER — COLCHICINE 0.6 MG PO TABS: 0.6000 mg | ORAL_TABLET | Freq: Two times a day (BID) | ORAL | Status: DC

## 2023-01-22 MED ORDER — HEPARIN (PORCINE) IN NACL 1000-0.9 UT/500ML-% IV SOLN
INTRAVENOUS | Status: DC | PRN
  Administered 2023-01-22 (×2): 500 mL

## 2023-01-22 MED ORDER — MIDAZOLAM HCL 2 MG/2ML IJ SOLN
INTRAMUSCULAR | Status: DC | PRN
  Administered 2023-01-22: 1 mg via INTRAVENOUS

## 2023-01-22 MED ORDER — LABETALOL HCL 5 MG/ML IV SOLN
10.0000 mg | INTRAVENOUS | Status: AC | PRN
Start: 2023-01-22 — End: 2023-01-22

## 2023-01-22 MED ORDER — MIDAZOLAM HCL 2 MG/2ML IJ SOLN
INTRAMUSCULAR | Status: AC
  Filled 2023-01-22: qty 2

## 2023-01-22 MED ORDER — IOHEXOL 350 MG/ML SOLN
INTRAVENOUS | Status: DC | PRN
  Administered 2023-01-22: 69 mL

## 2023-01-22 MED ORDER — SODIUM CHLORIDE 0.9 % IV SOLN
250.0000 mL | INTRAVENOUS | Status: DC | PRN
Start: 2023-01-22 — End: 2023-01-22

## 2023-01-22 MED ORDER — SODIUM CHLORIDE 0.9% FLUSH: 3.0000 mL | INTRAVENOUS | Status: DC | PRN

## 2023-01-22 MED ORDER — HYDRALAZINE HCL 20 MG/ML IJ SOLN
10.0000 mg | INTRAMUSCULAR | Status: AC | PRN
Start: 2023-01-22 — End: 2023-01-22

## 2023-01-22 MED ORDER — SODIUM CHLORIDE 0.9% FLUSH
3.0000 mL | Freq: Two times a day (BID) | INTRAVENOUS | Status: DC
  Administered 2023-01-22: 3 mL via INTRAVENOUS

## 2023-01-22 MED ORDER — LIDOCAINE HCL (PF) 1 % IJ SOLN
INTRAMUSCULAR | Status: DC | PRN
  Administered 2023-01-22: 20 mL

## 2023-01-22 MED ORDER — FENTANYL CITRATE (PF) 100 MCG/2ML IJ SOLN
INTRAMUSCULAR | Status: AC
  Filled 2023-01-22: qty 2

## 2023-01-22 MED ORDER — IBUPROFEN 200 MG PO TABS
400.0000 mg | ORAL_TABLET | Freq: Three times a day (TID) | ORAL | Status: DC
  Administered 2023-01-22 (×2): 400 mg via ORAL
  Filled 2023-01-22 (×2): qty 2

## 2023-01-22 MED ORDER — COLCHICINE 0.3 MG HALF TABLET: 0.3000 mg | ORAL_TABLET | Freq: Every day | ORAL | Status: DC

## 2023-01-22 MED ORDER — LIDOCAINE HCL (PF) 1 % IJ SOLN
INTRAMUSCULAR | Status: AC
Start: 2023-01-22 — End: ?
  Filled 2023-01-22: qty 30

## 2023-01-22 SURGICAL SUPPLY — 9 items
CATH INFINITI 5FR MULTPACK ANG (CATHETERS) IMPLANT
CLOSURE MYNX CONTROL 5F (Vascular Products) IMPLANT
KIT MICROPUNCTURE NIT STIFF (SHEATH) IMPLANT
PACK CARDIAC CATHETERIZATION (CUSTOM PROCEDURE TRAY) ×2 IMPLANT
SET ATX-X65L (MISCELLANEOUS) IMPLANT
SHEATH PINNACLE 5F 10CM (SHEATH) IMPLANT
SHEATH PROBE COVER 6X72 (BAG) IMPLANT
WIRE EMERALD 3MM-J .035X150CM (WIRE) IMPLANT
WIRE EMERALD 3MM-J .035X260CM (WIRE) IMPLANT

## 2023-01-22 NOTE — Progress Notes (Signed)
Patient ID: [name], male   DOB: [birth year], 52 y.o.   MRN: [id number] [facility] Progress Note   Assessment/ Plan:   1.  Staphylococcus epidermidis bacteremia/fever: Without evidence of SBE particularly with concern for prosthetic aortic valve.  On intravenous Ancef and with improving leukocytosis/infection markers.  Repeat blood cultures negative to date. 2.  Chest pain: Atypical but with high pretest probability for recurrent coronary disease especially based on positive stress testing.  Earlier today underwent coronary angiography that showed nonobstructive coronary artery disease with patent stent in ramus (no change compared to prior study).  Medical management to continue. 2. ESRD: Previously with spotty/suboptimal adherence to dialysis.  Reiterated the importance of adherence and will continue dialysis on TTS schedule with next treatment ordered for tomorrow. 3. Anemia: Continue ESA with dialysis, no overt blood loss noted. 4. CKD-MBD: Elevated phosphorus level noted, continue phosphorus binder and ongoing calcitriol for PTH control. 5. Nutrition: Continue renal diet with protein supplementation and fluid restriction. 6. Hypertension: Appears euvolemic on physical exam and we will continue hemodialysis/ultrafiltration for volume control.  Subjective:   Some intermittent chest pain noted overnight.  Just returned back from coronary angiogram   Objective:   BP 124/70   Pulse 81   Temp 98.4 F (36.9 C) (Oral)   Resp 14   Ht 5\' 11"  (1.803 m)   Wt 66.6 kg   SpO2 100%   BMI 20.48 kg/m   Physical Exam: Gen: Comfortable comfortably resting in bed, wife at bedside CVS: Pulse regular rhythm, normal rate, S1 and S2 normal Resp: Anteriorly clear to auscultation, no rales/rhonchi.  On oxygen via nasal cannula Abd: Soft, flat, nontender, bowel sounds normal Ext: No edema over lower extremities.  Left upper arm AV fistula with intact dressings  Labs: BMET Recent Labs   Lab 01/16/23 0643 01/17/23 1643 01/17/23 1656 01/18/23 0236 01/19/23 0613 01/21/23 0837 01/22/23 0439  NA 138 134* 134* 135 136 137 133*  K 3.9 4.6 4.7 4.6 4.7 4.7 4.4  CL 96* 98 99 98 98 98 95*  CO2 29 23  --  22 20* 25 28  GLUCOSE 117* 84 81 87 80 78 94  BUN 87* 110* 112* 112* 115* 70* 45*  CREATININE 7.45* 8.98* 9.80* 9.28* 9.90* 8.12* 5.96*  CALCIUM 9.8 9.5  --  9.1 9.0 9.4 8.9  PHOS  --   --   --  6.1* 7.6* 6.2*  --    CBC Recent Labs  Lab 01/17/23 1643 01/17/23 1656 01/18/23 0236 01/19/23 0613 01/20/23 0441 01/21/23 0837 01/22/23 0439  WBC 31.1*  --  36.0* 26.9* 16.1* 14.1* 11.5*  NEUTROABS 28.3*  --  31.2* 25.3*  --   --   --   HGB 9.0*   < > 8.5* 8.8* 7.9* 7.7* 7.9*  HCT 27.8*   < > 25.8* 27.4* 24.3* 24.0* 25.2*  MCV 95.2  --  95.2 95.8 96.0 98.0 96.9  PLT 275  --  239 217 158 141* 138*   < > = values in this interval not displayed.      Medications:     acetaminophen  1,000 mg Oral Q8H   amLODipine  10 mg Oral Daily   arformoterol  15 mcg Nebulization BID   And   umeclidinium bromide  1 puff Inhalation Daily   aspirin EC  81 mg Oral Daily   carvedilol  6.25 mg Oral BID WC   colchicine  0.3 mg Oral Daily   darbepoetin (ARANESP)  injection - DIALYSIS  60 mcg Subcutaneous Q Wed-1800   diclofenac Sodium  2 g Topical QID   feeding supplement (NEPRO CARB STEADY)  237 mL Oral BID BM   hydrALAZINE  50 mg Oral TID   ibuprofen  400 mg Oral TID   melatonin  10 mg Oral QHS   pantoprazole  40 mg Oral BID   sodium chloride flush  3 mL Intravenous Q12H   Zetta Bills, MD 01/22/2023, 12:35 PM

## 2023-01-22 NOTE — Interval H&P Note (Signed)
History and Physical Interval Note:  01/22/2023 11:04 AM  [name]  has presented today for surgery, with the diagnosis of chest pain.  The various methods of treatment have been discussed with the patient and family. After consideration of risks, benefits and other options for treatment, the patient has consented to  Procedure(s): LEFT HEART CATH AND CORONARY ANGIOGRAPHY (N/A) as a surgical intervention.  The patient's history has been reviewed, patient examined, no change in status, stable for surgery.  I have reviewed the patient's chart and labs.  Questions were answered to the patient's satisfaction.     Theron Arista [facility] 01/22/2023 11:05 AM

## 2023-01-22 NOTE — Progress Notes (Signed)
Patient Name: [name] Date of Encounter: 01/22/2023 [facility] Cardiologist: Marca Ancona, MD   Interval Summary  .    Patient reports feeling okay this morning.  He was given pain medicine about an hour ago, denies chest pain at this time.  Reports breathing feels stable.  Scheduled for cardiac catheterization today around noon.  Has no questions about his procedure.  Is n.p.o.  Vital Signs .    Vitals:   01/21/23 1942 01/21/23 2110 01/22/23 0347 01/22/23 0736  BP: 115/66 121/72 (!) 142/77 138/66  Pulse: 83  87 85  Resp: 17  14 18   Temp: 97.6 F (36.4 C)  98.7 F (37.1 C) 98.4 F (36.9 C)  TempSrc: Oral  Oral Oral  SpO2: 99%  100% 100%  Weight:      Height:        Intake/Output Summary (Last 24 hours) at 01/22/2023 0800 Last data filed at 01/22/2023 0737 Gross per 24 hour  Intake 70 ml  Output 2850 ml  Net -2780 ml      01/21/2023   12:28 PM 01/21/2023    8:26 AM 01/19/2023   12:34 PM  Last 3 Weights  Weight (lbs) 146 lb 13.2 oz 151 lb 3.8 oz --  Weight (kg) 66.6 kg 68.6 kg --      Telemetry/ECG    Normal sinus rhythm- Personally Reviewed  Physical Exam .   GEN: No acute distress.  Laying comfortably in the bed, head elevated Neck: No JVD Cardiac: RRR, grade 4/6 systolic murmur Respiratory: Breathing unlabored GI: Soft, nontender, non-distended  MS: No edema in bilateral lower extremities  Assessment & Plan .     NSTEMI CAD  - Patient previously had DES to the ramus in 2019  - Now admitted with bacteremia, sepsis. Complained of chest pain that had been constant, ongoing for 3 weeks. Worse with breathing and manual palpation, no improvement with nitroglycerin.  hsTn 1215>>1495>>1562>>1397 - Recent echocardiogram from 12/2022 showed EF 55-60%, no regional wall motion abnormalities, no regional wall motion abnormalities, severe LVH with grade II DD, normal RV function  - As patient was febrile with elevated leukocytosis with recent MSSE  bacteremia, initially decided not to perform cardiac catheterzation.  -Attempted to treat for possible pericarditis with high-dose aspirin, patient developed hemoptysis, questional hematemesis.  Aspirin stopped. -Patient underwent Lexiscan on 11/6, findings consistent with infarction.  No evidence of ischemia. -Based on stress test findings, suspect that patient may have had acute MI.  However, symptoms and timeline of events do not fit with ACS.  Possible he has post MI pericarditis. -After long discussions yesterday, decided to proceed with coronary angiography.  Per Dr. Damian Leavell notes, procedure will be diagnostic only.  As patient has had bleeding on aspirin alone, it is unlikely he would be able to tolerate DAPT if stenting was needed.  With a late presentation, also unlikely that stenting would improve his symptoms. - Scheduled for cath today  - If no significant CAD noted, can consider NSAIDs for treatment of pericarditis (not ideal given ESRD). Maybe could use steroids? Will discuss with MD   Recent Diagnosis of Bacteremia  -Patient recently admitted with gram-positive bacteremia.  On IV cefazolin prior to admission.  Patient admitted he had not been completing full dialysis sessions prior to admission. - Now on vancomycin and rocephin  - ID following   ESRD on HD  S/p vein angioplasty/stenting  - Patient reports that he was only going to HD once per  week prior to his recent admission. Now on TTS schedule - Managed by nephrology   Vascular Disease  S/p bentall with AVR/arch repair/repair of descending aortic aneurysm '16 Severe Aortic stenosis - TEE on 01/10/2023 during recent admission for bacteremia showed severe stenosis of aortic prosthesis with mean gradient of 31 mmHg. TEE on 10/30 showed moderate thickening of aortic valve prosthesis with at least moderate AS. There was no evidence of any vegetations.  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

## 2023-01-22 NOTE — Progress Notes (Signed)
[facility] PROGRESS NOTE    Progress Note  [name]  WUJ:[id number] DOB: [birth year] DOA: 01/17/2023 PCP: Loura Back, NP     Brief Narrative:   [name] is an 52 y.o. male past medical history significant for essential hypertension coronary artery disease status post CABG, MI end-stage renal disease on hemodialysis once a week descending aortic aneurysm aortic valve replacement bioprosthetic tobacco abuse recent bacteremia comes in for ongoing chest pain admitted for an NSTEMI and sepsis of unknown source   Assessment/Plan:    NSTEMI/pleuritic chest pain concern for possible pericarditis: Significant elevation of his troponins. Nuclear stress test that was inconclusive, cardiology recommended cardiac cath. Continue aspirin.  Not a candidate for stenting as he will be able to tolerate DAPT therapy due to his hemoptysis. For diagnostic left heart cath on 01/22/2023.  If no significant CAD.  Left-sided chest pain/back pain/abdominal discomfort possibly due to acute pericarditis: CT scan 01/16/2023 showed no acute findings. Probably pleuritic in nature at a positive coxsackievirus urology chest pain can be due to viral pericarditis. Start colchicine, will defer to NSAIDs to cardiology.  MSSA bacteremia: TEE on 01/13/2023 showed no obvious signs of vegetation. He was discharged on 10.31.2024 on cefazolin for plan for 6 weeks of antibiotic during dialysis. CT scan of the abdomen pelvis showed no acute findings. MRI of the L-spine no discitis or osteomyelitis Blood culture on 01/17/2023 negative till date.  Hiccups: Started on baclofen as needed. They have been going on for more than 4 days as per patient.  Hemoptysis: Heparin were held.  He had no further hemoptysis. Saturations are stable.  End-stage renal disease on hemodialysis once a week: Renal has been consulted continue HD once a week.  Essential hypertension: Continue home dose of amlodipine and  hydralazine.  Chronic diastolic heart failure: With a 2D echo on 01/13/2023 showed an EF of 60% severe LVH severely dilated LA and RA Moderate stenosis of aortic prosthetic valve, he appears euvolemic.  CAD status post CABG heart/hyperlipidemia: Continue Repatha every 14 days, continue aspirin.  COPD: Continue inhalers.  GERD: Continue Protonix.   DVT prophylaxis: lovenox Family Communication:none Status is: Inpatient Remains inpatient appropriate because: Possible NSTEMI    Code Status:     Code Status Orders  (From admission, onward)           Start     Ordered   01/17/23 1942  Full code  Continuous       Question:  By:  Answer:  Consent: discussion documented in EHR   01/17/23 1942           Code Status History     Date Active Date Inactive Code Status Order ID Comments User Context   01/09/2023 2005 01/14/2023 1552 Full Code [id number]  Frankey Shown, DO ED   12/19/2022 1943 12/21/2022 0248 Full Code [id number]  Darlin Drop, DO Inpatient   06/10/2021 1933 06/12/2021 0101 Full Code [id number]  Rehman, Areeg N, DO ED   02/05/2021 1409 02/05/2021 2116 Full Code [id number]  Laurey Morale, MD Inpatient   09/02/2020 1525 09/11/2020 2010 Full Code [id number]  Emeline General, MD ED   05/17/2020 1951 05/19/2020 1932 Full Code [id number]  Orland Mustard, MD ED   11/24/2019 1950 11/26/2019 1800 Full Code [id number]  Briscoe Deutscher, MD ED   09/24/2018 2225 09/28/2018 1754 Full Code [id number]  Briant Cedar, MD Inpatient   09/24/2018 0111 09/24/2018 1216 Full Code [id number]  Hillary Bow, DO ED  12/02/2017 2219 12/11/2017 1324 Full Code [id number]  Beatriz Stallion., PA-C Inpatient   02/28/2016 2320 03/06/2016 1745 Full Code [id number]  Jamie Kato, MD ED   09/07/2015 1730 09/09/2015 1426 Full Code [id number]  Leslye Peer, MD ED   06/21/2014 1424 06/21/2014 2248 Full Code [id number]  Laurey Morale, MD Inpatient   04/24/2014 0023 05/03/2014 2026 Full Code [id number]   Duayne Cal, NP ED   03/27/2014 2110 03/29/2014 0910 Full Code [id number]  Otis Brace, MD Inpatient   09/11/2013 1451 09/12/2013 2112 Full Code [id number]  Manuela Schwartz Inpatient   08/08/2013 1901 08/09/2013 1540 Full Code [id number]  Jeralyn Bennett, MD Inpatient   05/05/2013 2020 05/06/2013 2110 Full Code [id number]  Ozella Rocks, MD Inpatient   02/20/2013 1552 02/22/2013 1546 Full Code [id number]  Jeralyn Bennett, MD Inpatient   05/22/2011 1705 05/25/2011 1818 Full Code [id number]  Cleora Fleet, MD Inpatient      Advance Directive Documentation    Flowsheet Row Most Recent Value  Type of Advance Directive Healthcare Power of Attorney, Living will  Pre-existing out of facility DNR order (yellow form or pink MOST form) --  "MOST" Form in Place? --         IV Access:   Peripheral IV   Procedures and diagnostic studies:   NM Myocar Multi W/Spect W/Wall Motion / EF  Result Date: 01/20/2023   Findings are consistent with infarction. The study is intermediate risk.   ST depression was noted.  Downsloping ST depression noted in leads V5-V6 (worse than baseline). Very mild horizontal depression also noted in lead II.   LV perfusion is abnormal. There is no evidence of ischemia. There is evidence of infarction. Defect 1: There is a medium defect with moderate reduction in uptake present in the mid to basal inferolateral location(s) that is fixed. Consistent with infarction.     Medical Consultants:   None.   Subjective:    [name] chest pain not improved.  Objective:    Vitals:   01/21/23 1942 01/21/23 2110 01/22/23 0347 01/22/23 0736  BP: 115/66 121/72 (!) 142/77 138/66  Pulse: 83  87 85  Resp: 17  14 18   Temp: 97.6 F (36.4 C)  98.7 F (37.1 C) 98.4 F (36.9 C)  TempSrc: Oral  Oral Oral  SpO2: 99%  100% 100%  Weight:      Height:       SpO2: 100 % O2 Flow Rate (L/min): 3 L/min   Intake/Output Summary (Last 24 hours) at 01/22/2023  0830 Last data filed at 01/22/2023 0737 Gross per 24 hour  Intake 70 ml  Output 2850 ml  Net -2780 ml   Filed Weights   01/21/23 0826 01/21/23 1228  Weight: 68.6 kg 66.6 kg    Exam: General exam: In no acute distress. Respiratory system: Good air movement and clear to auscultation. Cardiovascular system: S1 & S2 heard, RRR. No JVD. Gastrointestinal system: Abdomen is nondistended, soft and nontender.  Extremities: No pedal edema. Skin: No rashes, lesions or ulcers Psychiatry: Judgement and insight appear normal. Mood & affect appropriate.  Data Reviewed:    Labs: Basic Metabolic Panel: Recent Labs  Lab 01/17/23 1643 01/17/23 1656 01/18/23 0236 01/19/23 0613 01/21/23 0837 01/22/23 0439  NA 134* 134* 135 136 137 133*  K 4.6 4.7 4.6 4.7 4.7 4.4  CL 98 99 98 98 98 95*  CO2 23  --  22 20* 25 28  GLUCOSE 84 81 87 80  78 94  BUN 110* 112* 112* 115* 70* 45*  CREATININE 8.98* 9.80* 9.28* 9.90* 8.12* 5.96*  CALCIUM 9.5  --  9.1 9.0 9.4 8.9  MG  --   --   --  2.6*  --   --   PHOS  --   --  6.1* 7.6* 6.2*  --    GFR Estimated Creatinine Clearance: 14 mL/min (A) (by C-G formula based on SCr of 5.96 mg/dL (H)). Liver Function Tests: Recent Labs  Lab 01/17/23 1643 01/18/23 0236 01/19/23 0613 01/21/23 0837  AST 97*  --  70*  --   ALT <5  --  5  --   ALKPHOS 58  --  59  --   BILITOT 0.8  --  0.8  --   PROT 6.2*  --  6.0*  --   ALBUMIN 2.9* 2.6* 2.6* 2.5*   No results for input(s): "LIPASE", "AMYLASE" in the last 168 hours. No results for input(s): "AMMONIA" in the last 168 hours. Coagulation profile Recent Labs  Lab 01/17/23 1643  INR 1.2   COVID-19 Labs  No results for input(s): "DDIMER", "FERRITIN", "LDH", "CRP" in the last 72 hours.  Lab Results  Component Value Date   SARSCOV2NAA NEGATIVE 01/09/2023   SARSCOV2NAA NEGATIVE 01/14/2021   SARSCOV2NAA NEGATIVE 09/02/2020   SARSCOV2NAA NEGATIVE 05/17/2020    CBC: Recent Labs  Lab 01/17/23 1643  01/17/23 1656 01/18/23 0236 01/19/23 0613 01/20/23 0441 01/21/23 0837 01/22/23 0439  WBC 31.1*  --  36.0* 26.9* 16.1* 14.1* 11.5*  NEUTROABS 28.3*  --  31.2* 25.3*  --   --   --   HGB 9.0*   < > 8.5* 8.8* 7.9* 7.7* 7.9*  HCT 27.8*   < > 25.8* 27.4* 24.3* 24.0* 25.2*  MCV 95.2  --  95.2 95.8 96.0 98.0 96.9  PLT 275  --  239 217 158 141* 138*   < > = values in this interval not displayed.   Cardiac Enzymes: No results for input(s): "CKTOTAL", "CKMB", "CKMBINDEX", "TROPONINI" in the last 168 hours. BNP (last 3 results) No results for input(s): "PROBNP" in the last 8760 hours. CBG: No results for input(s): "GLUCAP" in the last 168 hours. D-Dimer: No results for input(s): "DDIMER" in the last 72 hours. Hgb A1c: No results for input(s): "HGBA1C" in the last 72 hours. Lipid Profile: No results for input(s): "CHOL", "HDL", "LDLCALC", "TRIG", "CHOLHDL", "LDLDIRECT" in the last 72 hours. Thyroid function studies: No results for input(s): "TSH", "T4TOTAL", "T3FREE", "THYROIDAB" in the last 72 hours.  Invalid input(s): "FREET3" Anemia work up: No results for input(s): "VITAMINB12", "FOLATE", "FERRITIN", "TIBC", "IRON", "RETICCTPCT" in the last 72 hours. Sepsis Labs: Recent Labs  Lab 01/17/23 1657 01/18/23 0236 01/19/23 1610 01/20/23 0441 01/21/23 0837 01/22/23 0439  WBC  --    < > 26.9* 16.1* 14.1* 11.5*  LATICACIDVEN 0.7  --   --   --   --   --    < > = values in this interval not displayed.   Microbiology Recent Results (from the past 240 hour(s))  Culture, blood (Routine x 2)     Status: None   Collection Time: 01/17/23  4:23 PM   Specimen: BLOOD  Result Value Ref Range Status   Specimen Description BLOOD RIGHT ANTECUBITAL  Final   Special Requests   Final    BOTTLES DRAWN AEROBIC AND ANAEROBIC Blood Culture adequate volume   Culture   Final    NO GROWTH 5 DAYS Performed at  [facility], [address] [place], [place] [address]    Report Status 01/22/2023  FINAL  Final  Culture, blood (Routine x 2)     Status: None   Collection Time: 01/17/23  4:28 PM   Specimen: BLOOD RIGHT HAND  Result Value Ref Range Status   Specimen Description BLOOD RIGHT HAND  Final   Special Requests   Final    BOTTLES DRAWN AEROBIC AND ANAEROBIC Blood Culture results may not be optimal due to an excessive volume of blood received in culture bottles   Culture   Final    NO GROWTH 5 DAYS Performed at [facility], [address], [place], [place] [address]    Report Status 01/22/2023 FINAL  Final     Medications:    acetaminophen  1,000 mg Oral Q8H   amLODipine  10 mg Oral Daily   arformoterol  15 mcg Nebulization BID   And   umeclidinium bromide  1 puff Inhalation Daily   aspirin EC  81 mg Oral Daily   carvedilol  6.25 mg Oral BID WC   darbepoetin (ARANESP) injection - DIALYSIS  60 mcg Subcutaneous Q Wed-1800   diclofenac Sodium  2 g Topical QID   feeding supplement (NEPRO CARB STEADY)  237 mL Oral BID BM   hydrALAZINE  50 mg Oral TID   melatonin  10 mg Oral QHS   pantoprazole  40 mg Oral BID   sodium chloride flush  10 mL Intravenous Q12H   Continuous Infusions:   ceFAZolin (ANCEF) IV Stopped (01/21/23 1235)      LOS: 5 days   Marinda Elk  [facility]  01/22/2023, 8:30 AM

## 2023-01-22 NOTE — Progress Notes (Signed)
Patient off floor to cath lab.  

## 2023-01-23 DIAGNOSIS — I214 Non-ST elevation (NSTEMI) myocardial infarction: Secondary | ICD-10-CM | POA: Diagnosis not present

## 2023-01-23 DIAGNOSIS — D72829 Elevated white blood cell count, unspecified: Secondary | ICD-10-CM | POA: Diagnosis not present

## 2023-01-23 DIAGNOSIS — R6510 Systemic inflammatory response syndrome (SIRS) of non-infectious origin without acute organ dysfunction: Secondary | ICD-10-CM

## 2023-01-23 DIAGNOSIS — R7989 Other specified abnormal findings of blood chemistry: Secondary | ICD-10-CM | POA: Diagnosis not present

## 2023-01-23 DIAGNOSIS — B3323 Viral pericarditis: Secondary | ICD-10-CM | POA: Diagnosis not present

## 2023-01-23 LAB — CBC
HCT: 25.1 % — ABNORMAL LOW (ref 39.0–52.0)
Hemoglobin: 8 g/dL — ABNORMAL LOW (ref 13.0–17.0)
MCH: 31.3 pg (ref 26.0–34.0)
MCHC: 31.9 g/dL (ref 30.0–36.0)
MCV: 98 fL (ref 80.0–100.0)
Platelets: 145 10*3/uL — ABNORMAL LOW (ref 150–400)
RBC: 2.56 MIL/uL — ABNORMAL LOW (ref 4.22–5.81)
RDW: 15.7 % — ABNORMAL HIGH (ref 11.5–15.5)
WBC: 13 10*3/uL — ABNORMAL HIGH (ref 4.0–10.5)
nRBC: 0 % (ref 0.0–0.2)

## 2023-01-23 LAB — RENAL FUNCTION PANEL
Albumin: 2.5 g/dL — ABNORMAL LOW (ref 3.5–5.0)
Anion gap: 13 (ref 5–15)
BUN: 61 mg/dL — ABNORMAL HIGH (ref 6–20)
CO2: 23 mmol/L (ref 22–32)
Calcium: 9.3 mg/dL (ref 8.9–10.3)
Chloride: 93 mmol/L — ABNORMAL LOW (ref 98–111)
Creatinine, Ser: 7.85 mg/dL — ABNORMAL HIGH (ref 0.61–1.24)
GFR, Estimated: 8 mL/min — ABNORMAL LOW (ref >60)
Glucose, Bld: 89 mg/dL (ref 70–99)
Phosphorus: 7.2 mg/dL — ABNORMAL HIGH (ref 2.5–4.6)
Potassium: 4.3 mmol/L (ref 3.5–5.1)
Sodium: 129 mmol/L — ABNORMAL LOW (ref 135–145)

## 2023-01-23 MED ORDER — COLCHICINE 0.6 MG PO TABS: 0.3000 mg | ORAL_TABLET | Freq: Every day | ORAL | 0 refills | Status: DC

## 2023-01-23 MED ORDER — HEPARIN SODIUM (PORCINE) 1000 UNIT/ML DIALYSIS
40.0000 [IU]/kg | INTRAMUSCULAR | Status: DC | PRN
Start: 2023-01-23 — End: 2023-01-23
  Administered 2023-01-23: 2700 [IU] via INTRAVENOUS_CENTRAL
  Filled 2023-01-23: qty 3

## 2023-01-23 MED ORDER — PANTOPRAZOLE SODIUM 40 MG PO TBEC: 40.0000 mg | DELAYED_RELEASE_TABLET | Freq: Every day | ORAL | 0 refills | Status: AC

## 2023-01-23 MED ORDER — COLCHICINE 0.3 MG HALF TABLET
0.3000 mg | ORAL_TABLET | Freq: Every day | ORAL | Status: DC
  Administered 2023-01-23: 0.3 mg via ORAL
  Filled 2023-01-23: qty 1

## 2023-01-23 MED ORDER — PREDNISONE 10 MG PO TABS
15.0000 mg | ORAL_TABLET | Freq: Every day | ORAL | Status: DC
  Administered 2023-01-23: 15 mg via ORAL
  Filled 2023-01-23: qty 1

## 2023-01-23 MED ORDER — CARVEDILOL 6.25 MG PO TABS: 6.2500 mg | ORAL_TABLET | Freq: Two times a day (BID) | ORAL | 0 refills | Status: DC

## 2023-01-23 MED ORDER — PREDNISONE 5 MG PO TABS: 15.0000 mg | ORAL_TABLET | Freq: Every day | ORAL | 0 refills | Status: DC

## 2023-01-23 NOTE — Discharge Planning (Addendum)
[facility] Patient Discharge Orders- [place] [facility]: [place]  Patient's name: [name] Admit/DC Dates: 01/17/2023 - 01/23/2023  Discharge Diagnoses: Pleuritic chest pain, possible pericarditis - CTA and heart cath w/no acute findings.  Started on 0.3mg  colchicine daily - Make sure patient stopped taking this medication.  Can continue prednisone taper  Hx MSSA bacteremia  Aranesp: Given: no   Last Hgb: 8.0 PRBC's Given: no  ESA dose for discharge: no change  IV Iron dose at discharge: none d/t bacteremia  Heparin change: no  EDW Change: no  Bath Change: no  Access intervention/Change: no Details:  Hectorol/Calcitriol change: no  Discharge Labs: Calcium 9.3 Phosphorus 7.2 Albumin 2.5 K+ 4.3  IV Antibiotics: yes Details: Cefazolin 2g IV qHD until 02/22/23  On Coumadin?: no Last INR: Next INR: Managed By:   OTHER/APPTS/LAB ORDERS: Weekly CBC, BMP, ESR and CRP while on antibiotics    D/C Meds to be reconciled by nurse after every discharge.  Completed By: Virgina Norfolk, PA-C   Reviewed by: MD:______ RN_______

## 2023-01-23 NOTE — Plan of Care (Signed)

## 2023-01-23 NOTE — Procedures (Signed)
Patient seen on Hemodialysis. BP (!) 145/82   Pulse 88   Temp 97.6 F (36.4 C)   Resp 19   Ht 5\' 11"  (1.803 m)   Wt 67.3 kg   SpO2 96%   BMI 20.69 kg/m   QB 400, UF goal 2L Tolerating treatment without complaints at this time. Looks like he will be discharged home today.    Zetta Bills MD [facility]. Office # [phone] Pager # [phone] 9:56 AM

## 2023-01-23 NOTE — Progress Notes (Addendum)
Attempted once to call for report before 7AM.  Will call back after they get report from shift change.

## 2023-01-23 NOTE — Discharge Summary (Addendum)
Physician Discharge Summary  [name] DOB: [birth year] DOA: 01/17/2023  PCP: Loura Back, NP  Admit date: 01/17/2023 Discharge date: 01/23/2023  Admitted From: Home Disposition:  Home  Recommendations for Outpatient Follow-up:  End date of IV antibiotics is 02/21/2023 Please obtain BMP/CBC in one week Follow-up with cardiology in 2 to 4 weeks  Home Health:No Equipment/Devices:None  Discharge Condition:Stable CODE STATUS:Full Diet recommendation: Heart Healthy   Brief/Interim Summary: 52 y.o. male past medical history significant for essential hypertension coronary artery disease status post CABG, MI end-stage renal disease on hemodialysis once a week descending aortic aneurysm aortic valve replacement bioprosthetic tobacco abuse recent bacteremia comes in for ongoing chest pain admitted for an NSTEMI and SIRS of unknown source   Discharge Diagnoses:  Principal Problem:   NSTEMI (non-ST elevated myocardial infarction) (HCC) Active Problems:   ESRD (end stage renal disease) (HCC)   Sepsis (HCC)   Infectious pericarditis  Pleuritic chest pain concern for possible pericarditis: Sepsis was ruled out CT scan angio of the chest on 01/16/2023 showed no acute findings. Infectious disease was consulted concern pleuritic nature of his chest pain is likely pericarditis, his coxsackievirus positive on serology. He has significant elevation in his troponins, nuclear stress test was inconclusive cardiology recommended cardiac cath that showed no significant CAD. he was continue on aspirin. Started on colchicine low-dose and steroids low-dose with a slow taper. Which help with the pain.  History of MSSA bacteremia: TEE done on prior admission showed no signs of vegetation. He will continue cefazolin for 6 weeks. Blood cultures were done on 01/17/2023 have remained negative till date. End date of antibiotics is 02/21/2023  Hiccups: He was started on baclofen now they have  resolved.  History of hemoptysis: While on heparin, heparin was stopped he had no further hemoptysis. Saturations remained stable.  End-stage renal disease on hemodialysis once a week, Was consulted he will continue dialysis as an outpatient.  Essential hypertension: Continued Motifene Coreg and hydralazine.  Chronic diastolic heart failure: With a 2D echo on 01/13/2023 that showed an EF of 60% with moderate stenosis of his aortic prosthetic valve he appeared euvolemic no changes made to his medication.  History of CAD status post CABG/hyperlipidemia: Continue statins, Repatha every 14 days and aspirin.  COPD: Continue inhalers.  GERD: Continue Protonix  Discharge Instructions  Discharge Instructions     Diet - low sodium heart healthy   Complete by: As directed    Increase activity slowly   Complete by: As directed    No wound care   Complete by: As directed       Allergies as of 01/23/2023       Reactions   Clonidine Derivatives Nausea And Vomiting, Other (See Comments)   Tablets by mouth = Tolerated Patches on the skin = Nausea & vomiting after wearing 4-5 days    Imdur [isosorbide Nitrate] Other (See Comments)   Headaches -- patient instructed to continue taking   Tramadol Other (See Comments)   Headaches        Medication List     STOP taking these medications    HYDROcodone-acetaminophen 5-325 MG tablet Commonly known as: NORCO/VICODIN       TAKE these medications    acetaminophen 325 MG tablet Commonly known as: Tylenol Take 2 tablets (650 mg total) by mouth every 4 (four) hours as needed.   albuterol 108 (90 Base) MCG/ACT inhaler Commonly known as: VENTOLIN HFA Inhale 1 puff into the lungs every 6 (six) hours  as needed for wheezing or shortness of breath.   amLODipine 10 MG tablet Commonly known as: NORVASC TAKE 1 TABLET(10 MG) BY MOUTH DAILY What changed: See the new instructions.   aspirin EC 81 MG tablet Take 1 tablet (81 mg  total) by mouth daily.   carvedilol 6.25 MG tablet Commonly known as: COREG Take 1 tablet (6.25 mg total) by mouth 2 (two) times daily with a meal.   ceFAZolin IVPB Commonly known as: ANCEF Inject 2 g into the vein Every Tuesday,Thursday,and Saturday with dialysis. Indication:  MSSE bacteremia Last Day of Therapy:  02/22/23 Labs - Once weekly:  CBC/D and BMP, Labs - Once weekly: ESR and CRP Method of administration: Per hemodialysis protocol   colchicine 0.6 MG tablet Take 0.5 tablets (0.3 mg total) by mouth daily.   hydrALAZINE 100 MG tablet Commonly known as: APRESOLINE Take 0.5 tablets (50 mg total) by mouth 3 (three) times daily.   lidocaine-prilocaine cream Commonly known as: EMLA Apply 1 application. topically as needed (prior to port being accessed). Use on dialysis days (Tues, Thurs and Sat)   meclizine 25 MG tablet Commonly known as: ANTIVERT Take 1 tablet (25 mg total) by mouth 3 (three) times daily as needed for dizziness.   nitroGLYCERIN 0.4 MG SL tablet Commonly known as: NITROSTAT Place 1 tablet (0.4 mg total) under the tongue every 5 (five) minutes as needed for chest pain.   oxyCODONE-acetaminophen 5-325 MG tablet Commonly known as: Percocet Take 1 tablet by mouth every 4 (four) hours as needed for severe pain.   pantoprazole 40 MG tablet Commonly known as: PROTONIX Take 40 mg by mouth daily.   polyethylene glycol powder 17 GM/SCOOP powder Commonly known as: GLYCOLAX/MIRALAX Take 17 g by mouth daily.   predniSONE 5 MG tablet Commonly known as: DELTASONE Take 3 tablets (15 mg total) by mouth daily with breakfast. Take 15 mg daily for 3 weeks then take 10 mg for a week then 5 mg for a week then stop What changed:  medication strength how much to take additional instructions   Repatha SureClick 140 MG/ML Soaj Generic drug: Evolocumab Inject 140 mg into the skin every 14 (fourteen) days.   Senna-S 8.6-50 MG tablet Generic drug: senna-docusate Take  2 tablets by mouth 2 (two) times daily.   Stiolto Respimat 2.5-2.5 MCG/ACT Aers Generic drug: Tiotropium Bromide-Olodaterol Inhale 2 puffs into the lungs at bedtime.        Follow-up Information     Loura Back, NP Follow up.   Specialty: Nurse Practitioner Contact information: [address] [place] [address] [phone]         Danelle Earthly, MD. Go on 02/16/2023.   Specialty: Infectious Diseases Why: 09:00 am Contact information: [address], [address] [place] [address] [phone]                Allergies  Allergen Reactions   Clonidine Derivatives Nausea And Vomiting and Other (See Comments)    Tablets by mouth = Tolerated  Patches on the skin = Nausea & vomiting after wearing 4-5 days    Imdur [Isosorbide Nitrate] Other (See Comments)    Headaches -- patient instructed to continue taking    Tramadol Other (See Comments)    Headaches     Consultations: Cardio Infectious disease Nephrology   Procedures/Studies: CARDIAC CATHETERIZATION  Result Date: 01/22/2023   Prox LAD to Mid LAD lesion is 30% stenosed.   Mid LAD lesion is 40% stenosed.   Prox Cx  lesion is 40% stenosed.   Mid Cx lesion is 40% stenosed.   Prox RCA lesion is 40% stenosed.   1st Diag lesion is 95% stenosed.   Non-stenotic Ost Ramus lesion was previously treated. Nonobstructive CAD except for chronically occluded small diagonal. Patent stent in the Ramus intermediate. Markedly aneurysmal coronary arteries. No change in angiography compared to prior study. Continue medical management.   NM Myocar Multi W/Spect W/Wall Motion / EF  Result Date: 01/20/2023   Findings are consistent with infarction. The study is intermediate risk.   ST depression was noted.  Downsloping ST depression noted in leads V5-V6 (worse than baseline). Very mild horizontal depression also noted in lead II.   LV perfusion is abnormal. There is no evidence of ischemia. There is evidence of infarction.  Defect 1: There is a medium defect with moderate reduction in uptake present in the mid to basal inferolateral location(s) that is fixed. Consistent with infarction.   MR THORACIC SPINE WO CONTRAST  Result Date: 01/20/2023 CLINICAL DATA:  Bacteremia and pain EXAM: MRI CERVICAL AND THORACIC SPINE WITHOUT CONTRAST TECHNIQUE: Multiplanar and multiecho pulse sequences of the cervical spine, to include the craniocervical junction and cervicothoracic junction, and the thoracic spine, were obtained without intravenous contrast. COMPARISON:  None Available. FINDINGS: MRI CERVICAL SPINE FINDINGS Alignment: Reversal of normal cervical lordosis. No static subluxation. Vertebrae: No fracture, evidence of discitis, or bone lesion. Cord: Normal signal and morphology. Posterior Fossa, vertebral arteries, paraspinal tissues: Negative. Disc levels: C1-2: Unremarkable. C2-3: Small disc bulge. There is no spinal canal stenosis. No neural foraminal stenosis. C3-4: Small disc bulge. There is no spinal canal stenosis. No neural foraminal stenosis. C4-5: Normal disc space and facet joints. There is no spinal canal stenosis. No neural foraminal stenosis. C5-6: Disc desiccation without herniation. There is no spinal canal stenosis. No neural foraminal stenosis. C6-7: Disc desiccation and small left subarticular protrusion. There is no spinal canal stenosis. No neural foraminal stenosis. C7-T1: Normal disc space and facet joints. There is no spinal canal stenosis. No neural foraminal stenosis. MRI THORACIC SPINE FINDINGS Alignment:  Physiologic. Vertebrae: No fracture, evidence of discitis, or bone lesion. Cord:  Normal signal and morphology. Paraspinal and other soft tissues: Thoracic aortic stent Disc levels: No spinal canal stenosis or nerve root impingement. IMPRESSION: 1. No acute abnormality of the cervical or thoracic spine. 2. Mild cervical degenerative disc disease without spinal canal or neural foraminal stenosis.  Electronically Signed   By: Deatra Robinson M.D.   On: 01/20/2023 02:08   MR CERVICAL SPINE WO CONTRAST  Result Date: 01/20/2023 CLINICAL DATA:  Bacteremia and pain EXAM: MRI CERVICAL AND THORACIC SPINE WITHOUT CONTRAST TECHNIQUE: Multiplanar and multiecho pulse sequences of the cervical spine, to include the craniocervical junction and cervicothoracic junction, and the thoracic spine, were obtained without intravenous contrast. COMPARISON:  None Available. FINDINGS: MRI CERVICAL SPINE FINDINGS Alignment: Reversal of normal cervical lordosis. No static subluxation. Vertebrae: No fracture, evidence of discitis, or bone lesion. Cord: Normal signal and morphology. Posterior Fossa, vertebral arteries, paraspinal tissues: Negative. Disc levels: C1-2: Unremarkable. C2-3: Small disc bulge. There is no spinal canal stenosis. No neural foraminal stenosis. C3-4: Small disc bulge. There is no spinal canal stenosis. No neural foraminal stenosis. C4-5: Normal disc space and facet joints. There is no spinal canal stenosis. No neural foraminal stenosis. C5-6: Disc desiccation without herniation. There is no spinal canal stenosis. No neural foraminal stenosis. C6-7: Disc desiccation and small left subarticular protrusion. There is no spinal canal stenosis.  No neural foraminal stenosis. C7-T1: Normal disc space and facet joints. There is no spinal canal stenosis. No neural foraminal stenosis. MRI THORACIC SPINE FINDINGS Alignment:  Physiologic. Vertebrae: No fracture, evidence of discitis, or bone lesion. Cord:  Normal signal and morphology. Paraspinal and other soft tissues: Thoracic aortic stent Disc levels: No spinal canal stenosis or nerve root impingement. IMPRESSION: 1. No acute abnormality of the cervical or thoracic spine. 2. Mild cervical degenerative disc disease without spinal canal or neural foraminal stenosis. Electronically Signed   By: Deatra Robinson M.D.   On: 01/20/2023 02:08   DG CHEST PORT 1 VIEW  Result  Date: 01/18/2023 CLINICAL DATA:  Shortness of breath. EXAM: PORTABLE CHEST 1 VIEW COMPARISON:  Chest radiograph dated 01/17/2023. FINDINGS: Is cardiomegaly with vascular congestion. Left lung base atelectasis or scarring. Trace left pleural effusion. No pneumothorax. 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 No acute osseous pathology. IMPRESSION: Cardiomegaly with vascular congestion. Electronically Signed   By: Elgie Collard M.D.   On: 01/18/2023 18:58   DG Chest Port 1 View  Result Date: 01/17/2023 CLINICAL DATA:  Shortness of breath. Left chest pain and mid upper back pain for 1 week. EXAM: PORTABLE CHEST 1 VIEW COMPARISON:  Radiographs 01/16/2023 and 01/09/2023. Chest CTA 01/16/2023. FINDINGS: 1711 hours. There are extensive postsurgical changes from previous thoracic aorta stent grafting and aortic valve replacement. The heart size and mediastinal contours are stable. Stable mild scarring at the left lung base. The lungs otherwise appear clear. There is no pleural effusion or pneumothorax. Loop recorder and telemetry leads are noted. The bones appear unchanged. IMPRESSION: No evidence of acute cardiopulmonary process. Stable postsurgical changes. Electronically Signed   By: Carey Bullocks M.D.   On: 01/17/2023 17:30   CT Angio Chest/Abd/Pel for Dissection W and/or Wo Contrast  Result Date: 01/16/2023 CLINICAL DATA:  Acute aortic syndrome. EXAM: CT ANGIOGRAPHY CHEST, ABDOMEN AND PELVIS TECHNIQUE: Non-contrast CT of the chest was initially obtained. Multidetector CT imaging through the chest, abdomen and pelvis was performed using the standard protocol during bolus administration of intravenous contrast. Multiplanar reconstructed images and MIPs were obtained and reviewed to evaluate the vascular anatomy. RADIATION DOSE REDUCTION: This exam was performed according to the departmental dose-optimization program which includes automated exposure control, adjustment of the mA  and/or kV according to patient size and/or use of iterative reconstruction technique. CONTRAST:  OMNIPAQUE IOHEXOL 350 MG/ML SOLN COMPARISON:  01/09/2023.  04/27/2022 FINDINGS: CTA CHEST FINDINGS Cardiovascular: The heart is enlarged. No substantial pericardial effusion. Status post aortic valve replacement. Endograft identified in the thoracic aorta, similar to prior studies and patent. Postoperative change from debranching noted with patency of the branch vessel anatomy. The lumen of the left brachiocephalic vein stent opacifies consistent with patency. Enlargement of the pulmonary outflow tract/main pulmonary arteries suggests pulmonary arterial hypertension. No large central pulmonary embolus. Mediastinum/Nodes: No mediastinal lymphadenopathy. There is no hilar lymphadenopathy. The esophagus has normal imaging features. There is no axillary lymphadenopathy. Lungs/Pleura: Subtle changes of centrilobular and paraseptal emphysema noted. Dependent atelectasis. No pleural effusion. Musculoskeletal: No worrisome lytic or sclerotic osseous abnormality. Review of the MIP images confirms the above findings. CTA ABDOMEN AND PELVIS FINDINGS VASCULAR Aorta: Postsurgical changes noted in the abdominal aorta, distal to the stent graft. Aneurysmal dilatation of the distal abdominal aorta measured previously at 3.2 cm is stable at 3.2 cm today. Fluid collection adjacent to the operative site described previously is stable, measuring 3.7 x 3.4 cm today  compared to 3.7 x 3.5 cm previously. Celiac axis, and SMA are widely patent. Origin of the left renal artery not well demonstrated and similar to prior although left renal artery does opacify. Right renal artery appears widely patent. Celiac: Postsurgical change. As before, origin appears somewhat patulous without evidence for flow limiting stenosis. SMA: Surgical changes similar to prior with patulous origin. No features to suggest flow limiting stenosis. Renals: Stable.   Both renal arteries opacified, similar to prior. IMA: IMA does opacify. Inflow: Interval small dilatation of both common iliac arteries, similar to prior. Right common iliac artery measures 2.0 cm diameter compared to 2.2 cm previously. 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 Atherosclerotic disease characterized as both external iliac arteries, patent bilaterally. Internal iliac arteries are patent bilaterally. Veins: No obvious venous abnormality within the limitations of this arterial phase study. Review of the MIP images confirms the above findings. NON-VASCULAR Hepatobiliary: Hypodensity in the right liver is too small to characterize but statistically most likely benign. Layering tiny calcified gallstones evident (167/24). No intrahepatic or extrahepatic biliary dilation. Pancreas: No focal mass lesion. No dilatation of the main duct. No intraparenchymal cyst. No peripancreatic edema. Spleen: No splenomegaly. No suspicious focal mass lesion. Adrenals/Urinary Tract: No adrenal nodule or mass. No hydronephrosis or overtly suspicious renal mass. No hydroureter. The urinary bladder appears normal for the degree of distention. Stomach/Bowel: Stomach is unremarkable. No gastric wall thickening. No evidence of outlet obstruction. Duodenum is normally positioned as is the ligament of Treitz. No small bowel wall thickening. No small bowel dilatation. Moderate to large stool volume. Lymphatic: No discernible lymphadenopathy in the abdomen or pelvis. Reproductive: Prostate gland is enlarged. Other: None. Musculoskeletal: No worrisome lytic or sclerotic osseous abnormality. Review of the MIP images confirms the above findings. IMPRESSION: 1. Stable exam. No new or acute findings in the chest, abdomen, or pelvis. 2. Stable postsurgical changes in the thoracic and abdominal aorta. Stable aneurysmal dilatation of the distal abdominal aorta measuring 3.2 cm diameter.  Stable fluid collection adjacent to the operative site in the abdominal aorta. 3. 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 4. Enlargement of the pulmonary outflow tract/main pulmonary arteries suggests pulmonary arterial hypertension. 5. Cholelithiasis. 6. Prostatomegaly. 7. Aortic Atherosclerosis (ICD10-I70.0) and Emphysema (ICD10-J43.9). Electronically Signed   By: Kennith Center M.D.   On: 01/16/2023 10:11   DG Chest 2 View  Result Date: 01/16/2023 CLINICAL DATA:  Left-sided chest pain. EXAM: CHEST - 2 VIEW COMPARISON:  01/09/2023 FINDINGS: The lungs are clear without focal pneumonia, edema, pneumothorax or pleural effusion. The cardiopericardial silhouette is within normal limits for size. Thoracic aortic stent graft again noted. No acute bony abnormality. Telemetry leads overlie the chest. IMPRESSION: No active cardiopulmonary disease. Electronically Signed   By: Kennith Center M.D.   On: 01/16/2023 07:17   ECHO TEE  Result Date: 01/13/2023    TRANSESOPHOGEAL ECHO REPORT   Patient Name:   AHMARE MORALAS Date of Exam: 01/13/2023 Medical Rec #:  [id number]     Height:       71.0 in Accession #:    [id number]    Weight:       144.8 lb Date of Birth:  December 18, 1970      BSA:          1.838 m Patient Age:    52 years  BP:           150/81 mmHg Patient Gender: M             HR:           92 bpm. Exam Location:  Inpatient Procedure: Transesophageal Echo, Color Doppler and Cardiac Doppler Indications:     Bacteremia  History:         Patient has prior history of Echocardiogram examinations, most                  recent 01/10/2023. CHF, CAD, Prior Cardiac Surgery, COPD and                  ESRD, h/o AVR with a 27mm Pericard Mitroflow bioprosthetic and                  subsequent total arch replacement in 2016 at [facility],                  Signs/Symptoms:Bacteremia; Risk Factors:Current Smoker,                  Dyslipidemia,  Hypertension and ETOH Abuse.                  Aortic Valve: 27 mm Mitroflow pericardial valve is present in                  the aortic position.  Sonographer:     Milbert Coulter Referring Phys:  [id number] [facility] Diagnosing Phys: Armanda Magic MD PROCEDURE: After discussion of the risks and benefits of a TEE, an informed consent was obtained from the patient. The transesophogeal probe was passed without difficulty through the esophogus of the patient. Imaged were obtained with the patient in a left lateral decubitus position. Sedation performed by different physician. The patient was monitored while under deep sedation. Anesthestetic sedation was provided intravenously by Anesthesiology: 144.98mg  of Propofol, 100mg  of Lidocaine. Image quality was good. The patient's vital signs; including heart rate, blood pressure, and oxygen saturation; remained stable throughout the procedure. The patient developed no complications during the procedure.  IMPRESSIONS  1. Left ventricular ejection fraction, by estimation, is 60 to 65%. The left ventricle has normal function. The left ventricle has no regional wall motion abnormalities. There is severe concentric left ventricular hypertrophy.  2. Right ventricular systolic function is normal. The right ventricular size is normal.  3. Left atrial size was severely dilated. No left atrial/left atrial appendage thrombus was detected.  4. Right atrial size was moderately dilated.  5. The mitral valve is normal in structure. Moderate mitral valve regurgitation. No evidence of mitral stenosis.  6. The aortic valve has been repaired/replaced. There is moderate calcification of the aortic valve prosthesis. There is moderate thickening of the aortic valve prosthesis. Aortic valve regurgitation is trivial.. There is a 27 mm Mitroflow pericardial valve present in the aortic position. The AVR is not well visualized but there is at least moderate degeneration of the valve with at least  moderate aortic stenosis by visualization.  7. The inferior vena cava is normal in size with greater than 50% respiratory variability, suggesting right atrial pressure of 3 mmHg.  8. Aortic root/ascending aorta has been repaired/replaced. Conclusion(s)/Recommendation(s): Normal biventricular function with no obvious vegetations. There is at least moderate stenosis of the AV prosthesis. FINDINGS  Left Ventricle: Left ventricular ejection fraction, by estimation, is 60 to 65%. The left ventricle has normal function. The left ventricle has no regional  wall motion abnormalities. The left ventricular internal cavity size was normal in size. There is  severe concentric left ventricular hypertrophy. Right Ventricle: The right ventricular size is normal. No increase in right ventricular wall thickness. Right ventricular systolic function is normal. Left Atrium: Left atrial size was severely dilated. Spontaneous echo contrast was present in the left atrium. No left atrial/left atrial appendage thrombus was detected. Right Atrium: Right atrial size was moderately dilated. Pericardium: There is no evidence of pericardial effusion. Mitral Valve: The mitral valve is normal in structure. Moderate mitral valve regurgitation, with centrally-directed jet. No evidence of mitral valve stenosis. Tricuspid Valve: The tricuspid valve is normal in structure. Tricuspid valve regurgitation is mild . No evidence of tricuspid stenosis. Aortic Valve: The aortic valve has been repaired/replaced. There is moderate calcification of the aortic valve. There is moderate thickening of the aortic valve. Aortic valve regurgitation is trivial. No aortic stenosis is present. There is a 27 mm Mitroflow pericardial valve present in the aortic position. Pulmonic Valve: The pulmonic valve was normal in structure. Pulmonic valve regurgitation is trivial. No evidence of pulmonic stenosis. Aorta: The aortic root/ascending aorta has been repaired/replaced.  Venous: The inferior vena cava is normal in size with greater than 50% respiratory variability, suggesting right atrial pressure of 3 mmHg. IAS/Shunts: No atrial level shunt detected by color flow Doppler. Additional Comments: Spectral Doppler performed. Armanda Magic MD Electronically signed by Armanda Magic MD Signature Date/Time: 01/13/2023/11:14:53 AM    Final    EP STUDY  Result Date: 01/13/2023 See surgical note for result.  MR LUMBAR SPINE WO CONTRAST  Result Date: 01/11/2023 CLINICAL DATA:  infection EXAM: MRI LUMBAR SPINE WITHOUT CONTRAST TECHNIQUE: Multiplanar, multisequence MR imaging of the lumbar spine was performed. No intravenous contrast was administered. COMPARISON:  None Available. FINDINGS: Segmentation:  Standard. Alignment:  Physiologic. Vertebrae: No abnormal bone marrow edema to suggest discitis/osteomyelitis. Diffusely low STIR and T2 signal is likely related to patient's iron injection. Conus medullaris and cauda equina: Conus extends to the T12 level. Conus and cauda equina appear normal. Paraspinal and other soft tissues: No appreciable paraspinal edema. Known infrarenal abdominal aortic aneurysm better characterized on recent CT of the chest/abdomen/pelvis. Disc levels: T12-L1: No significant disc protrusion, foraminal stenosis, or canal stenosis. L1-L2: No significant disc protrusion, foraminal stenosis, or canal stenosis. L2-L3: No significant disc protrusion, foraminal stenosis, or canal stenosis. L3-L4: Mild disc height loss and desiccation. No significant disc protrusion, foraminal stenosis, or canal stenosis. L4-L5: Mild disc height loss and desiccation. Small disc bulge and annular fissure. No significant disc protrusion, foraminal stenosis, or canal stenosis. L5-S1: Mild disc height loss and desiccation. No significant disc protrusion, foraminal stenosis, or canal stenosis. IMPRESSION: 1. No obvious evidence of discitis/osteomyelitis. Postcontrast imaging could provide  more sensitive evaluation if clinically warranted. 2. Mild lower lumbar degenerative change without significant stenosis. 3. Abnormal bone marrow signal diffusely is likely related to the patient's iron infusions. 4. Known infrarenal abdominal aortic aneurysm better characterized on recent CT of the chest/abdomen/pelvis. Electronically Signed   By: Feliberto Harts M.D.   On: 01/11/2023 17:47   ECHOCARDIOGRAM COMPLETE  Result Date: 01/10/2023    ECHOCARDIOGRAM REPORT   Patient Name:   CHEY BRENDLE Date of Exam: 01/10/2023 Medical Rec #:  [id number]     Height:       71.0 in Accession #:    [id number]    Weight:       132.0 lb Date of Birth:  Feb 20, 1971  BSA:          1.767 m Patient Age:    52 years      BP:           143/78 mmHg Patient Gender: M             HR:           86 bpm. Exam Location:  Inpatient Procedure: 2D Echo, Cardiac Doppler and Color Doppler Indications:     CHF-Acute Diastolic I50.31  History:         Patient has prior history of Echocardiogram examinations, most                  recent 04/15/2022. Cardiomyopathy, CAD and Previous Myocardial                  Infarction, ETOH abuse, Stroke and COPD, Aortic Valve Disease                  and Mitral Valve Disease; Risk Factors:Current Smoker,                  Hypertension and Dyslipidemia. Aortic dissection repair.  Sonographer:     Dondra Prader RVT RCS Referring Phys:  [id number] [facility] ADEFESO Diagnosing Phys: Lennie Odor MD IMPRESSIONS  1. Left ventricular ejection fraction, by estimation, is 55 to 60%. The left ventricle has normal function. The left ventricle has no regional wall motion abnormalities. There is severe concentric left ventricular hypertrophy. Left ventricular diastolic  parameters are consistent with Grade II diastolic dysfunction (pseudonormalization).  2. Right ventricular systolic function is normal. The right ventricular size is normal. Tricuspid regurgitation signal is inadequate for assessing PA pressure.  3. Left  atrial size was severely dilated.  4. Right atrial size was mild to moderately dilated.  5. The mitral valve is grossly normal. Moderate mitral valve regurgitation. No evidence of mitral stenosis.  6. Bioprosthetic AoV. Vmax 3.5 m/s, MG 31 mmHG, EOA 0.90cm2, DI 0.26, AT 120 msec. 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 The aortic valve has been repaired/replaced. Aortic valve regurgitation is not visualized. Severe aortic valve stenosis. Procedure Date: 11/2014. Echo findings are consistent with stenosis of the aortic prosthesis. Aortic valve acceleration time measures 120 msec.  7. Aortic root repair measures 38 mm. Aortic root/ascending aorta has been repaired/replaced.  8. The inferior vena cava is normal in size with greater than 50% respiratory variability, suggesting right atrial pressure of 3 mmHg. Comparison(s): Changes from prior study are noted. Aortic prosthetic valve with concerns for severe stenosis on this study. FINDINGS  Left Ventricle: Left ventricular ejection fraction, by estimation, is 55 to 60%. The left ventricle has normal function. The left ventricle has no regional wall motion abnormalities. The left ventricular internal cavity size was normal in size. There is  severe concentric left ventricular hypertrophy. Abnormal (paradoxical) septal motion consistent with post-operative status. Left ventricular diastolic parameters are consistent with Grade II diastolic dysfunction (pseudonormalization). Right Ventricle: The right ventricular size is normal. No increase in right ventricular wall thickness. Right ventricular systolic function is normal. Tricuspid regurgitation signal is inadequate for assessing PA pressure. Left Atrium: Left atrial size was severely dilated. Right Atrium: Right atrial size was mild to moderately dilated. Pericardium: There is no evidence of pericardial effusion. Mitral Valve: The mitral valve is grossly normal. Moderate mitral valve  regurgitation. No evidence of mitral valve stenosis. Tricuspid Valve: The tricuspid valve is grossly normal. Tricuspid valve regurgitation is mild .  No evidence of tricuspid stenosis. Aortic Valve: Bioprosthetic AoV. Vmax 3.5 m/s, MG 31 mmHG, EOA 0.90cm2, DI 0.26, AT 120 msec. 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 The aortic valve has been repaired/replaced. Aortic valve regurgitation is not visualized. Severe aortic stenosis is present. Aortic valve mean gradient measures 31.2 mmHg. Aortic valve peak gradient measures 50.6 mmHg. Aortic valve area, by VTI measures 0.90 cm. There is a unknown bioprosthetic valve present in the aortic position. Pulmonic Valve: The pulmonic valve was grossly normal. Pulmonic valve regurgitation is not visualized. No evidence of pulmonic stenosis. Aorta: Aortic root repair measures 38 mm. The aortic root/ascending aorta has been repaired/replaced. Venous: The inferior vena cava is normal in size with greater than 50% respiratory variability, suggesting right atrial pressure of 3 mmHg. IAS/Shunts: The atrial septum is grossly normal.  LEFT VENTRICLE PLAX 2D LVIDd:         5.10 cm   Diastology LVIDs:         3.50 cm   LV e' medial:    5.98 cm/s LV PW:         1.90 cm   LV E/e' medial:  13.5 LV IVS:        1.70 cm   LV e' lateral:   7.51 cm/s LVOT diam:     2.10 cm   LV E/e' lateral: 10.7 LV SV:         69 LV SV Index:   39 LVOT Area:     3.46 cm  RIGHT VENTRICLE             IVC RV Basal diam:  3.20 cm     IVC diam: 1.00 cm RV S prime:     12.90 cm/s TAPSE (M-mode): 2.3 cm LEFT ATRIUM              Index        RIGHT ATRIUM           Index LA Vol (A2C):   114.0 ml 64.51 ml/m  RA Area:     22.40 cm LA Vol (A4C):   89.7 ml  50.76 ml/m  RA Volume:   69.20 ml  39.16 ml/m LA Biplane Vol: 117.0 ml 66.21 ml/m  AORTIC VALVE                     PULMONIC VALVE AV Area (Vmax):    1.13 cm      PR End Diast Vel: 6.15 msec AV Area (Vmean):   1.12 cm AV Area (VTI):      0.90 cm AV Vmax:           355.73 cm/s AV Vmean:          238.367 cm/s AV VTI:            0.764 m AV Peak Grad:      50.6 mmHg AV Mean Grad:      31.2 mmHg LVOT Vmax:         116.00 cm/s LVOT Vmean:        76.900 cm/s LVOT VTI:          0.199 m LVOT/AV VTI ratio: 0.26  AORTA Ao Root diam: 3.80 cm MITRAL VALVE MV Area (PHT): 3.99 cm    SHUNTS MV Decel Time: 190 msec    Systemic VTI:  0.20 m MV E velocity: 80.50 cm/s  Systemic Diam: 2.10 cm MV A velocity: 77.20 cm/s MV E/A ratio:  1.04 Gerri Spore  O'Neal MD Electronically signed by Lennie Odor MD Signature Date/Time: 01/10/2023/10:15:25 AM    Final (Updated)    CT CHEST ABDOMEN PELVIS WO CONTRAST  Result Date: 01/09/2023 CLINICAL DATA:  Cough, weight loss. EXAM: CT CHEST, ABDOMEN AND PELVIS WITHOUT CONTRAST TECHNIQUE: Multidetector CT imaging of the chest, abdomen and pelvis was performed following the standard protocol without IV contrast. RADIATION DOSE REDUCTION: This exam was performed according to the departmental dose-optimization program which includes automated exposure control, adjustment of the mA and/or kV according to patient size and/or use of iterative reconstruction technique. COMPARISON:  04/27/2022 FINDINGS: CT CHEST FINDINGS Cardiovascular: Interval left brachiocephalic vein stenting traversing the previously narrowed segment. Aortic stent again noted along with prosthetic aortic valve. Postoperative findings from debranching. Left anterior descending, right, and circumflex coronary artery atheromatous vascular calcification. Today's noncontrast exam does not assess patency. Mild cardiomegaly. Mediastinum/Nodes: Unremarkable Lungs/Pleura: Mild centrilobular emphysema. Mild scarring in the left lower lobe and lingula. Airway thickening is present, suggesting bronchitis or reactive airways disease. Musculoskeletal: Prior median sternotomy. Paucity of adipose tissues. CT ABDOMEN PELVIS FINDINGS Hepatobiliary: Stable 1 cm hypodense lesion in the  right hepatic lobe on image 59 series 12, probably a cyst. Similar small stable hypodense lesion in the left hepatic lobe measures 4 mm in diameter on image 11 series 2. No further imaging workup of these lesions is indicated. Small gallstones in the gallbladder as on image 39 series 2. Pancreas: Slightly indistinct margins due to paucity of abdominal adipose tissue. No specific abnormality observed. Spleen: Unremarkable Adrenals/Urinary Tract: Stable moderate atrophy of the left kidney mild atrophy of the right kidney. Adrenal glands unremarkable. Urinary bladder unremarkable. Stomach/Bowel: Paucity of intra-adipose tissue and lack of oral and IV contrast makes separation of bowel loops problematic. No dilated bowel. Vascular/Lymphatic: Calcifications and postoperative findings along the surgical bypass grafts of the celiac trunk, SMA, and renal arteries, roughly similar contours 2 the 04/27/2022 exam, and with re-demonstration the 3.8 cm density just below the operative site which may reflect a postoperative fluid collection. Infrarenal abdominal aortic aneurysm 3.2 cm in diameter on image 41 of series 2, similar to previous. Left iliac artery caliber stable at 3.3 cm, right common iliac artery caliber stable at 2.1 cm. Reproductive: Unremarkable Other: No supplemental non-categorized findings. Musculoskeletal: Unremarkable IMPRESSION: 1. A definite cause for the patient's weight loss is not identified on today's noncontrast exam. 2. Airway thickening is present, suggesting bronchitis or reactive airways disease. This may be contributing to the patient's cough. 3. Mild centrilobular emphysema. 4. Interval left brachiocephalic vein stenting traversing the previously narrowed segment. 5. Stable contours of postoperative findings in the thoracic aorta with debranching, and generally stable appearance of the abdominal systemic arterial bypasses. Chronic mildly complex postoperative fluid collection below the  postoperative site in the abdominal aorta. 6. Stable 3.2 cm infrarenal abdominal aortic aneurysm. Typical recommendation would be follow-up ultrasound every 3 years, but given the complexity of the postoperative vascular scenario in the chest and abdomen, more frequent surveillance may be indicated as per vascular surgical surveillance protocols. 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 7. Stable moderate atrophy of the left kidney and mild atrophy of the right kidney. 8. Cholelithiasis. 9. Paucity of intra-adipose tissue and lack of oral and IV contrast makes separation of bowel loops difficult. Aortic Atherosclerosis (ICD10-I70.0) and Emphysema (ICD10-J43.9). Electronically Signed   By: Gaylyn Rong M.D.   On: 01/09/2023 15:41   CT Head Wo Contrast  Result Date: 01/09/2023 CLINICAL DATA:  Headaches, increasing  frequency or severity. Headache and bilateral arm pain following carotid artery stenting. EXAM: CT HEAD WITHOUT CONTRAST TECHNIQUE: Contiguous axial images were obtained from the base of the skull through the vertex without intravenous contrast. RADIATION DOSE REDUCTION: This exam was performed according to the departmental dose-optimization program which includes automated exposure control, adjustment of the mA and/or kV according to patient size and/or use of iterative reconstruction technique. 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 No acute infarct, hemorrhage, or mass lesion is present. No significant white matter lesions are present. Deep brain nuclei are within normal limits. The ventricles are of normal size. No significant extraaxial fluid collection is present. Midline structures are within normal limits. The brainstem and cerebellum are within normal limits. Vascular: Extensive atherosclerotic calcifications are present in the cavernous internal carotid arteries  bilaterally. Calcifications are present in left vertebral artery. 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 Skull: Calvarium is intact. No focal lytic or blastic lesions are present. No significant extracranial soft tissue lesion is present. Sinuses/Orbits: The paranasal sinuses and mastoid air cells are clear. The globes and orbits are within normal limits. IMPRESSION: 1. No acute intracranial abnormality or significant interval change. 2. 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 3. 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 Electronically Signed   By: Marin Roberts M.D.   On: 01/09/2023 15:32   DG Chest Port 1 View  Result Date: 01/09/2023 CLINICAL DATA:  Questionable sepsis - evaluate for abnormality EXAM: PORTABLE CHEST 1 VIEW COMPARISON:  December 25, 2022 FINDINGS: The cardiomediastinal silhouette is unchanged in contour.Status post endovascular repair of the aorta. Status post median sternotomy. Cardiac loop recorder. No pleural effusion. No pneumothorax. No acute pleuroparenchymal abnormality. Surgical clips project over the RIGHT axilla. IMPRESSION: No acute cardiopulmonary abnormality. Electronically Signed   By: Meda Klinefelter M.D.   On: 01/09/2023 14:39   PERIPHERAL VASCULAR CATHETERIZATION  Result Date: 12/28/2022 DATE OF SERVICE: 12/25/2022  PATIENT:  Diannia Ruder  52 y.o. male  PRE-OPERATIVE DIAGNOSIS:  ESRD; central venous stenosis  POST-OPERATIVE DIAGNOSIS:  Same  PROCEDURE:  Ultrasound guided left AVF access Left arm fistulagram Intravascular ultrasound of the superior vena cava, left brachiocephalic vein, left subclavian vein, left axillary vein Left brachiocephalic arteriovenous angioplasty and stenting (16x9mm Abre) Conscious sedation (53 minutes)  SURGEON:  Surgeons and Role:    * Leonie Douglas, MD - Primary  ASSISTANT: none  ANESTHESIA:   local and IV sedation  EBL:  minimal  BLOOD ADMINISTERED:none  DRAINS: none  LOCAL MEDICATIONS USED:  NONE  SPECIMEN:  none  COUNTS: confirmed correct.  TOURNIQUET:  none  PATIENT DISPOSITION:  PACU - hemodynamically stable.  Delay start of Pharmacological VTE agent (>24hrs) due to surgical blood loss or risk of bleeding: no  INDICATION FOR PROCEDURE: KACYN BASTIEN is a 53 y.o. male with ESRD and central venous stenosis. After careful discussion of risks, benefits, and alternatives the patient was offered fistulagram. The patient understood and wished to proceed.  OPERATIVE FINDINGS: occluded left brachiocephalic vein. Successful recanalization of left brachiocephalic vein with angioplasty and stenting. Smooth thrill in fistula at completion.  DESCRIPTION OF PROCEDURE: After identification of the patient in the pre-operative holding area, the patient was transferred to the operating room. The patient was positioned supine on the operating room table.  Anesthesia was induced. The left arm was prepped and draped in standard fashion. A surgical pause was performed confirming correct patient, procedure, and operative location.  Duplex ultrasound was used to access the left arm brachiobasilic arteriovenous fistula.  Fistulogram was performed centrally.  This confirmed brachiocephalic vein occlusion.  I am suspicious that compression of the aorto brachiocephalic ranch bypass was causing compression.  I was able to traverse the occlusion fairly easily with a Glidewire advantage.  Intravascular ultrasound was performed of the superior vena cava, left brachiocephalic vein, left subclavian vein, and left axillary vein.  A 16 x 80 mm Opry stent was brought on the field and delivered across the lesion.  This was deployed with overlap into normal vein proximally distally.  Angioplasty with a 12 x 40 mm Atlas balloon was then performed.  Follow-up fistulogram showed resolution of occlusion and much improved flow through the chest.  All endovascular equipment  was removed.  A pursestring was placed around the access and rendered hemostatic.  Upon completion of the case instrument and sharps counts were confirmed correct. The patient was transferred to the PACU in good condition. I was present for all portions of the procedure.  FOLLOW UP PLAN: Assuming a normal postoperative course, I will see the patient in 2-3 weeks with AV fistula duplex.  Rande Brunt. Lenell Antu, MD [facility] Phone Number: [phone] 12/25/2022 9:11 AM   DG CHEST PORT 1 VIEW  Result Date: 12/25/2022 CLINICAL DATA:  Chest pain after procedure. EXAM: PORTABLE CHEST 1 VIEW COMPARISON:  December 21, 2022.  April 27, 2022. FINDINGS: Mild cardiomegaly is noted. Grossly stable appearance of thoracic aortic stent graft involving ascending and descending thoracic aorta. Sternotomy wires are noted. Interval placement new stent in expected position of left brachiocephalic vein. No pneumothorax or pleural effusion is noted. Lungs are clear. The thorax is unremarkable. IMPRESSION: Interval placement of new stent in expected position of left brachiocephalic vein. Thoracic stent graft noted on prior exam is grossly unremarkable. Lungs are clear. Electronically Signed   By: Lupita Raider M.D.   On: 12/25/2022 11:43   (Echo, Carotid, EGD, Colonoscopy, ERCP)    Subjective: No new complaints  Discharge Exam: Vitals:   01/22/23 2046 01/23/23 0457  BP: 128/70 (!) 145/70  Pulse:  80  Resp:  14  Temp:  98.1 F (36.7 C)  SpO2:  96%   Vitals:   01/22/23 1910 01/22/23 2042 01/22/23 2046 01/23/23 0457  BP: 128/70  128/70 (!) 145/70  Pulse: 96   80  Resp: 17   14  Temp: 98.6 F (37 C)   98.1 F (36.7 C)  TempSrc: Oral   Oral  SpO2: 96% 97%  96%  Weight:      Height:        General: Pt is alert, awake, not in acute distress Cardiovascular: RRR, S1/S2 +, no rubs, no gallops Respiratory: CTA bilaterally, no wheezing, no rhonchi Abdominal: Soft, NT,  ND, bowel sounds + Extremities: no edema, no cyanosis    The results of significant diagnostics from this hospitalization (including imaging, microbiology, ancillary and laboratory) are listed below for reference.     Microbiology: Recent Results (from the past 240 hour(s))  Culture, blood (Routine x 2)     Status: None   Collection Time: 01/17/23  4:23 PM   Specimen: BLOOD  Result Value Ref Range Status   Specimen Description BLOOD RIGHT ANTECUBITAL  Final   Special Requests  Final    BOTTLES DRAWN AEROBIC AND ANAEROBIC Blood Culture adequate volume   Culture   Final    NO GROWTH 5 DAYS Performed at [facility], [address], [place], [place] [address]    Report Status 01/22/2023 FINAL  Final  Culture, blood (Routine x 2)     Status: None   Collection Time: 01/17/23  4:28 PM   Specimen: BLOOD RIGHT HAND  Result Value Ref Range Status   Specimen Description BLOOD RIGHT HAND  Final   Special Requests   Final    BOTTLES DRAWN AEROBIC AND ANAEROBIC Blood Culture results may not be optimal due to an excessive volume of blood received in culture bottles   Culture   Final    NO GROWTH 5 DAYS Performed at [facility], [address] [place], [place] [address]    Report Status 01/22/2023 FINAL  Final     Labs: BNP (last 3 results) Recent Labs    04/27/22 0819 01/09/23 1539 01/17/23 1643  BNP 198.1* 1,044.9* 1,461.9*   Basic Metabolic Panel: Recent Labs  Lab 01/17/23 1643 01/17/23 1656 01/18/23 0236 01/19/23 0613 01/21/23 0837 01/22/23 0439  NA 134* 134* 135 136 137 133*  K 4.6 4.7 4.6 4.7 4.7 4.4  CL 98 99 98 98 98 95*  CO2 23  --  22 20* 25 28  GLUCOSE 84 81 87 80 78 94  BUN 110* 112* 112* 115* 70* 45*  CREATININE 8.98* 9.80* 9.28* 9.90* 8.12* 5.96*  CALCIUM 9.5  --  9.1 9.0 9.4 8.9  MG  --   --   --  2.6*  --   --   PHOS  --   --  6.1* 7.6* 6.2*  --    Liver Function Tests: Recent Labs  Lab 01/17/23 1643 01/18/23 0236 01/19/23 0613  01/21/23 0837  AST 97*  --  70*  --   ALT <5  --  5  --   ALKPHOS 58  --  59  --   BILITOT 0.8  --  0.8  --   PROT 6.2*  --  6.0*  --   ALBUMIN 2.9* 2.6* 2.6* 2.5*   No results for input(s): "LIPASE", "AMYLASE" in the last 168 hours. No results for input(s): "AMMONIA" in the last 168 hours. CBC: Recent Labs  Lab 01/17/23 1643 01/17/23 1656 01/18/23 0236 01/19/23 8657 01/20/23 0441 01/21/23 0837 01/22/23 0439  WBC 31.1*  --  36.0* 26.9* 16.1* 14.1* 11.5*  NEUTROABS 28.3*  --  31.2* 25.3*  --   --   --   HGB 9.0*   < > 8.5* 8.8* 7.9* 7.7* 7.9*  HCT 27.8*   < > 25.8* 27.4* 24.3* 24.0* 25.2*  MCV 95.2  --  95.2 95.8 96.0 98.0 96.9  PLT 275  --  239 217 158 141* 138*   < > = values in this interval not displayed.   Cardiac Enzymes: No results for input(s): "CKTOTAL", "CKMB", "CKMBINDEX", "TROPONINI" in the last 168 hours. BNP: Invalid input(s): "POCBNP" CBG: No results for input(s): "GLUCAP" in the last 168 hours. D-Dimer No results for input(s): "DDIMER" in the last 72 hours. Hgb A1c No results for input(s): "HGBA1C" in the last 72 hours. Lipid Profile No results for input(s): "CHOL", "HDL", "LDLCALC", "TRIG", "CHOLHDL", "LDLDIRECT" in the last 72 hours. Thyroid function studies No results for input(s): "TSH", "T4TOTAL", "T3FREE", "THYROIDAB" in the last 72 hours.  Invalid input(s): "FREET3" Anemia work up No  results for input(s): "VITAMINB12", "FOLATE", "FERRITIN", "TIBC", "IRON", "RETICCTPCT" in the last 72 hours. Urinalysis    Component Value Date/Time   COLORURINE YELLOW 01/17/2023 2006   APPEARANCEUR CLEAR 01/17/2023 2006   LABSPEC 1.018 01/17/2023 2006   PHURINE 6.0 01/17/2023 2006   GLUCOSEU NEGATIVE 01/17/2023 2006   HGBUR MODERATE (A) 01/17/2023 2006   HGBUR negative 02/18/2009 0901   BILIRUBINUR NEGATIVE 01/17/2023 2006   KETONESUR NEGATIVE 01/17/2023 2006   PROTEINUR 100 (A) 01/17/2023 2006   UROBILINOGEN 0.2 01/02/2015 1945   NITRITE NEGATIVE  01/17/2023 2006   LEUKOCYTESUR NEGATIVE 01/17/2023 2006   Sepsis Labs Recent Labs  Lab 01/19/23 0613 01/20/23 0441 01/21/23 0837 01/22/23 0439  WBC 26.9* 16.1* 14.1* 11.5*   Microbiology Recent Results (from the past 240 hour(s))  Culture, blood (Routine x 2)     Status: None   Collection Time: 01/17/23  4:23 PM   Specimen: BLOOD  Result Value Ref Range Status   Specimen Description BLOOD RIGHT ANTECUBITAL  Final   Special Requests   Final    BOTTLES DRAWN AEROBIC AND ANAEROBIC Blood Culture adequate volume   Culture   Final    NO GROWTH 5 DAYS Performed at [facility], [address], [place], [place] [address]    Report Status 01/22/2023 FINAL  Final  Culture, blood (Routine x 2)     Status: None   Collection Time: 01/17/23  4:28 PM   Specimen: BLOOD RIGHT HAND  Result Value Ref Range Status   Specimen Description BLOOD RIGHT HAND  Final   Special Requests   Final    BOTTLES DRAWN AEROBIC AND ANAEROBIC Blood Culture results may not be optimal due to an excessive volume of blood received in culture bottles   Culture   Final    NO GROWTH 5 DAYS Performed at [facility], [address], [place], [place] [address]    Report Status 01/22/2023 FINAL  Final     Time coordinating discharge: Over 40 minutes  SIGNED:   Marinda Elk, MD  [facility] 01/23/2023, 7:47 AM Pager   If 7PM-7AM, please contact night-coverage [email or web address] Password [email or web address]

## 2023-01-23 NOTE — Progress Notes (Signed)
Received patient in bed to unit.  Alert and oriented.  Informed consent signed and in chart.   TX duration:3 Hours 15 minutes  Patient tolerated well.  Transported back to the room  Alert, without acute distress.  Hand-off given to patient's nurse.   Access used: Left Fistula Access issues: none  Total UF removed: 2L Medication(s) given: Dilaudid   01/23/23 1127  Vitals  Temp 97.9 F (36.6 C)  Temp Source Oral  BP 132/78  Pulse Rate 89  ECG Heart Rate 89  Resp 15  Oxygen Therapy  SpO2 96 %  O2 Device Room Air  During Treatment Monitoring  HD Safety Checks Performed Yes  Intra-Hemodialysis Comments Tx completed;Tolerated well  Dialysis Fluid Bolus Normal Saline  Bolus Amount (mL) 300 mL  Fistula / Graft Left Upper arm Arteriovenous fistula  Placement Date/Time: 09/09/20 1117   Orientation: Left  Access Location: (c) Upper arm  Access Type: Arteriovenous fistula  Status Deaccessed     Stacie Glaze LPN [facility]

## 2023-01-24 ENCOUNTER — Telehealth: Payer: Self-pay | Admitting: Nephrology

## 2023-01-24 NOTE — Telephone Encounter (Signed)
Transition of Care Contact from Inpatient Facility   Date of Discharge: 01/23/23 Date of Contact: 01/24/23 Method of contact: phone Talked to patient   Patient contacted to discuss transition of care form recent hospitaliztion. Patient was admitted to [facility] from 11/3 to 11/9 with the discharge diagnosis of Pleuritic chest pain concern for possible pericarditis.      Medication changes were reviewed - Advised not to take colchicine - reported understanding.  Patient will follow up with is outpatient dialysis center 01/26/23.  Other follow up needs include patient reports he did not receive pain medication from pharmacy.  Advised to call pharmacy and check on Rx.  D/C summary listed oxycodone-acetaminophen 5-325mg  prn for pain.    Virgina Norfolk, PA-C [employer]

## 2023-01-25 ENCOUNTER — Encounter (HOSPITAL_COMMUNITY): Payer: Self-pay | Admitting: Cardiology

## 2023-01-25 LAB — LIPOPROTEIN A (LPA): Lipoprotein (a): 231.6 nmol/L — ABNORMAL HIGH (ref <75.0)

## 2023-01-25 NOTE — Progress Notes (Signed)
Late Note Entry- Nov. 11, 2024  Pt was d/c on Saturday. Contacted [facility] this morning to advise clinic of pt's d/c date and that pt should resume care tomorrow. Renal PA has sent orders to clinic for HD and iv abx with HD.   Olivia Canter Renal Navigator [phone]

## 2023-01-29 ENCOUNTER — Telehealth (HOSPITAL_COMMUNITY): Payer: Self-pay

## 2023-01-29 NOTE — Telephone Encounter (Signed)
Called to confirm/remind patient of their appointment at the [facility] on 02/01/23.   Patient reminded to bring all medications and/or complete list.  Confirmed patient has transportation. Gave directions, instructed to utilize valet parking.  Confirmed appointment prior to ending call.

## 2023-02-01 ENCOUNTER — Other Ambulatory Visit (HOSPITAL_COMMUNITY): Payer: Self-pay | Admitting: Cardiology

## 2023-02-01 ENCOUNTER — Encounter (HOSPITAL_COMMUNITY): Payer: Self-pay

## 2023-02-01 ENCOUNTER — Ambulatory Visit (HOSPITAL_COMMUNITY)
Admit: 2023-02-01 | Discharge: 2023-02-01 | Disposition: A | Discharge status: Home / Self Care | Payer: 59 | Source: Ambulatory Visit | Attending: Family Medicine | Admitting: Family Medicine

## 2023-02-01 ENCOUNTER — Inpatient Hospital Stay (HOSPITAL_COMMUNITY)
Admission: RE | Admit: 2023-02-01 | Discharge: 2023-02-01 | Disposition: A | Discharge status: Home / Self Care | Payer: 59 | Source: Ambulatory Visit | Attending: Cardiology | Admitting: Cardiology

## 2023-02-01 VITALS — BP 148/74 | HR 95 | Wt 145.6 lb

## 2023-02-01 DIAGNOSIS — Z953 Presence of xenogenic heart valve: Secondary | ICD-10-CM | POA: Diagnosis not present

## 2023-02-01 DIAGNOSIS — I5022 Chronic systolic (congestive) heart failure: Secondary | ICD-10-CM | POA: Diagnosis not present

## 2023-02-01 DIAGNOSIS — R7881 Bacteremia: Secondary | ICD-10-CM | POA: Diagnosis not present

## 2023-02-01 DIAGNOSIS — N529 Male erectile dysfunction, unspecified: Secondary | ICD-10-CM | POA: Diagnosis not present

## 2023-02-01 DIAGNOSIS — R002 Palpitations: Secondary | ICD-10-CM

## 2023-02-01 DIAGNOSIS — B9561 Methicillin susceptible Staphylococcus aureus infection as the cause of diseases classified elsewhere: Secondary | ICD-10-CM | POA: Insufficient documentation

## 2023-02-01 DIAGNOSIS — B3323 Viral pericarditis: Secondary | ICD-10-CM | POA: Diagnosis not present

## 2023-02-01 DIAGNOSIS — I10 Essential (primary) hypertension: Secondary | ICD-10-CM | POA: Diagnosis not present

## 2023-02-01 DIAGNOSIS — F17210 Nicotine dependence, cigarettes, uncomplicated: Secondary | ICD-10-CM | POA: Diagnosis not present

## 2023-02-01 DIAGNOSIS — I359 Nonrheumatic aortic valve disorder, unspecified: Secondary | ICD-10-CM

## 2023-02-01 DIAGNOSIS — I252 Old myocardial infarction: Secondary | ICD-10-CM | POA: Diagnosis present

## 2023-02-01 DIAGNOSIS — I132 Hypertensive heart and chronic kidney disease with heart failure and with stage 5 chronic kidney disease, or end stage renal disease: Secondary | ICD-10-CM | POA: Diagnosis not present

## 2023-02-01 DIAGNOSIS — Z992 Dependence on renal dialysis: Secondary | ICD-10-CM | POA: Insufficient documentation

## 2023-02-01 DIAGNOSIS — Z7982 Long term (current) use of aspirin: Secondary | ICD-10-CM | POA: Insufficient documentation

## 2023-02-01 DIAGNOSIS — R079 Chest pain, unspecified: Secondary | ICD-10-CM

## 2023-02-01 DIAGNOSIS — E785 Hyperlipidemia, unspecified: Secondary | ICD-10-CM | POA: Diagnosis present

## 2023-02-01 DIAGNOSIS — I2510 Atherosclerotic heart disease of native coronary artery without angina pectoris: Secondary | ICD-10-CM

## 2023-02-01 DIAGNOSIS — N186 End stage renal disease: Secondary | ICD-10-CM

## 2023-02-01 DIAGNOSIS — Z9889 Other specified postprocedural states: Secondary | ICD-10-CM

## 2023-02-01 DIAGNOSIS — Z955 Presence of coronary angioplasty implant and graft: Secondary | ICD-10-CM | POA: Insufficient documentation

## 2023-02-01 DIAGNOSIS — Z720 Tobacco use: Secondary | ICD-10-CM

## 2023-02-01 DIAGNOSIS — I319 Disease of pericardium, unspecified: Secondary | ICD-10-CM

## 2023-02-01 MED ORDER — CARVEDILOL 12.5 MG PO TABS: 12.5000 mg | ORAL_TABLET | Freq: Two times a day (BID) | ORAL | 3 refills | Status: AC

## 2023-02-01 NOTE — Progress Notes (Signed)
Date:  02/01/2023   ID:  [name], DOB 08/07/1970, MRN [id number]   Provider location: [place] [facility] Type of Visit: Established patient   PCP:  Loura Back, NP  Cardiologist:  Dr. Shirlee Latch Nephrology: [facility].    HPI: [name] is a 52 y.o. male who has a history of CAD, NSTEMI 11/2017, smoking, CKD Stage III, HTN, AAA repair 2016 at 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  The descending thoracic aorta has dilated to 5.1 cm.  Cardiac catheterization in April 2016 demonstrated 60% first diagonal lesion.  He underwent aortic valve replacement with bovine pericardial tissue valve and reconstruction along with aortic root replacement and endovascular repair of the descending thoracic aortic aneurysm at [facility].  He has had intermittent chest discomfort since then.   Admitted in 12/02/2017 chest pain. Had NSTEMI. He had LHC with stent placed proximal ramus.  Repeat cath in 7/20 with nonobstructive disease.   He was admitted in 9/21 with atypical chest pain.  Echo showed EF 60-65% with severe LVH, normal RV, bioprosthetic aortic valve with mean gradient 18 mmHg.  Cardiolite showed no ischemia.   Echoes in 3/22 and 6/22 showed hyperdynamic LV with aortic valve mean gradient > 30. Therefore, TEE was done in 8/22 to assess the bioprosthetic aortic valve.  This showed EF 60-65%, severe LVH, normal RV, bioprosthetic AoV that opened with minimal restriction, mean gradient 20 mmHg in setting of anesthesia and BP control, no AI. Suspect small/undersized aortic valve.   Patient now has ESRD and is on dialysis.   LHC 11/22 showed small high 1st diagonal with  diffuse severe disease, similar to prior. Otherwise, nonobstructive disease.  S/p L AVF.   Admitted 10/24 with CP. BCx + for staph epidermidis. Treated with cefazolin. TEE did not show valvular vegetation. ID planned for 6 weeks IV abx.  Re-admitted 01/16/23 CP. HsTroponins elevated, nuclear stress test was inconclusive so underwent LHC, showing no significant CAD. Pain felt to be 2/2 viral pericarditis as Coxsackie serologies +. Started on low-dose colchicine, prednisone and remained on ASA.  Today he returns for post hospital HF follow up. Overall feeling fine. Main complaint is palpitations, worse with activity. Symptoms improve with application of IcyHot to chest and taking Percocet. He is not SOB with activity. Continues with CP, waxes and wanes throughout the day. Denies dizziness, edema, or PND/Orthopnea. Appetite ok. No fever or chills. Taking all medications. He makes urine. Receiving IV abx at dialysis, dialyzes T/Th/Sat. Smokes THC daily (helps with pain), smokes 2-3 cigs/day, no ETOH. He has 14 grandchildren.  ECG (personally reviewed): NSR 97 bpm  Labs (10/19): K 4.1, creatinine 1.78, myeloma panel negative Labs (2/20): K 4.2, creatinine 1.75, LDL 128 Labs (7/21): LDL 66 Labs (9/21): K 4.1, creatinine 3.31, hgb 11.9 Labs (10/21): K 4, creatinine 2.96, hgb 12.8 Labs (3/22): LDL 55 Labs (10/22): hgb 10.6 Labs (11/22): K 3.8, hgb 10.6 Labs (3/23): BNP 395, hgb 9.4, LDL 47, HDL 48 Labs (2/24): K 4.1, creatinine 7.4 Labs (11/24): K 4.4, creatinine 5.96, Lp(a)  231  Review of Systems: All systems reviewed and negative except as per HPI.    PMH: 1.  H/o ankle fractures bilaterally 2. GERD 3. HTN: Negative urinary catecholeamine collection for pheochromocytoma.  - Renal artery dopplers (3/22): Not suggestive of significant renal artery stenosis.  4. ESRD: Suspect hypertensive nephropathy.  5. Cardiomyopathy: Most likely due to heavy ETOH ingestion and uncontrolled HTN.   Echo (3/13)  with EF 35-40%, diffuse HK, mild LVH, moderate to severe eccentric MR, mild to moderate AI with left coronary cusp prolapse, PA systolic pressure 38 mmHg.  Lexiscan myoview (4/13): EF 37%, global hypokinesis, no ischemic or infarction.  Echo (12/14) with EF 45-50%, diffuse hypokinesis, moderate LVH, mild MR, moderate AI.  Echo (4/16) with EF 55-60%, severe LVH, mild LV dilation, moderate AI and moderate MR.   - Echo (9/19): EF 35-40% with diffuse hypokinesis, severe LVH, bioprosthetic aortic valve functioning normally.  - PYP scan (1/20): No evidence for TTR amyloidosis.  - Echo (9/21): EF 60-65%, severe LVH, normal RV, bioprosthetic aortic valve with mean gradient 18 mmHg.  - TEE (8/22): EF 60-65%, severe LVH, normal RV, bioprosthetic AoV that opened with minimal restriction, mean gradient 20 mmHg in setting of anesthesia and BP control, no AI.  Suspect small/undersized aortic valve. - Echo (10/24): EF 55-60%, G2DD, normal RV, moderate MR, Bioprosthetic AoV. Vmax 3.5 m/s, MG 31 mmHG, EOA 0.90 cm2, DI 0.26, AT 120 msec. 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  - TEE (10/24): LVEF 65%, severe concentric LVH, normal RV, moderate MR, moderate thickening of AoV prosthesis, trivial AI with at least moderate degeneration of AoV with a least moderate stenosis 6. ETOH abuse: Quit 3/13.  7. Active smoker. 8. Valvular heart disease: Echo in 3/13 showed moderate to severe eccentric MR and mild to moderate AI with prolapsing left coronary cusp. Echo 12/14 with mild MR and moderate AI.  Echo 4/16 with moderate AI and moderate MR.  - S/p Bentall procedure with bioprosthetic aortic valve 9/16.  - Echoes in 3/22 and 6/22 showed hyperdynamic LV with bioprosthetic aortic valve with mean gradient > 30.  - TEE (8/22): EF 60-65%, severe LVH, normal RV, bioprosthetic AoV that opened with minimal restriction, mean gradient 20 mmHg in setting of anesthesia and BP control, no AI.  Suspect  small/undersized aortic valve. - TEE (10/24): LVEF 65%, severe concentric LVH, normal RV, moderate MR, moderate thickening of AoV prosthesis, trivial AI with at least moderate degeneration of AoV with a least moderate stenosis. 9.  CVA: Has ILR. 10. Type B aortic dissection: Occurred in 2/16, from just distal to the left subclavian.  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    - 10/03/2014 First stage median sternotomy with right axillary cannulation for aortic valved conduit root replacement with coronary reconstruction (button Bentall Procedure), 27 mm Sorin Mitroflow bovine pericardial valved conduit, total arch replacement (24mm Vascutek [place] multibranch arch graft with individual head vessel reimplantation. - Status post open repair of an Extent III TAA secondary to chronic dissection with a # 22mm Vascutek "Coselli" multibranch Dacron graft with individual celiac, SMA, and bilateral renal artery implantation on 01/04/2017, [facility].  11. CAD: LHC (4/16) with 60% ostial D1.   - NSTEMI 9/19: LHC (9/19) showed 50% proximal LCx, 90% ramus => DES to ramus.  - LHC (7/20): D1 60% stenosis, patent ramus stent.  - Cardiolite (9/21): Inferior fixed defect, no ischemia.  - LHC (11/22): D1 95% stenosis, patient ramus stent. No interventional targets. - Myoview (11/24): Intermediate risk, no evidence of ischemia, medium defect defect with moderate reduction in uptake present in  the mid to basal inferolateral location(s) that is fixed. Consistent with infarction.  - LHC (11/24): chronically occluded small diagonal, patent stent in the Ramus intermediate, markedly aneurysmal cors; nonobstructive CAD, no change in angiography compared to prior study. 12. Sleep study (10/21) with no significant OSA.   Current Outpatient Medications  Medication Sig Dispense Refill   acetaminophen (TYLENOL) 325 MG tablet Take 2 tablets (650 mg total) by mouth every 4 (four) hours as needed. 60 tablet 0    albuterol (VENTOLIN HFA) 108 (90 Base) MCG/ACT inhaler Inhale 1 puff into the lungs every 6 (six) hours as needed for wheezing or shortness of breath.     amLODipine (NORVASC) 10 MG tablet TAKE 1 TABLET(10 MG) BY MOUTH DAILY 90 tablet 3   aspirin EC 81 MG EC tablet Take 1 tablet (81 mg total) by mouth daily. 90 tablet 3   carvedilol (COREG) 6.25 MG tablet Take 1 tablet (6.25 mg total) by mouth 2 (two) times daily with a meal. 30 tablet 0   colchicine 0.6 MG tablet Take 0.5 tablets (0.3 mg total) by mouth daily. 30 tablet 0   Evolocumab (REPATHA SURECLICK) 140 MG/ML SOAJ Inject 140 mg into the skin every 14 (fourteen) days. 6 mL 3   hydrALAZINE (APRESOLINE) 100 MG tablet Take 0.5 tablets (50 mg total) by mouth 3 (three) times daily.     lidocaine-prilocaine (EMLA) cream Apply 1 application. topically as needed (prior to port being accessed). Use on dialysis days (Tues, Thurs and Sat)     meclizine (ANTIVERT) 25 MG tablet Take 1 tablet (25 mg total) by mouth 3 (three) times daily as needed for dizziness. 30 tablet 0   nitroGLYCERIN (NITROSTAT) 0.4 MG SL tablet Place 1 tablet (0.4 mg total) under the tongue every 5 (five) minutes as needed for chest pain. 25 tablet 0   oxyCODONE-acetaminophen (PERCOCET) 5-325 MG tablet Take 1 tablet by mouth every 4 (four) hours as needed for severe pain. 20 tablet 0   pantoprazole (PROTONIX) 40 MG tablet Take 1 tablet (40 mg total) by mouth daily. 30 tablet 0   polyethylene glycol powder (GLYCOLAX/MIRALAX) 17 GM/SCOOP powder Take 17 g by mouth daily. 238 g 0   predniSONE (DELTASONE) 5 MG tablet Take 3 tablets (15 mg total) by mouth daily with breakfast. Take 15 mg daily for 3 weeks then take 10 mg for a week then 5 mg for a week then stop 30 tablet 0   senna-docusate (SENOKOT-S) 8.6-50 MG tablet Take 2 tablets by mouth 2 (two) times daily. 60 tablet 0   STIOLTO RESPIMAT 2.5-2.5 MCG/ACT AERS Inhale 2 puffs into the lungs at bedtime.     Current Facility-Administered  Medications  Medication Dose Route Frequency Provider Last Rate Last Admin   sodium chloride flush (NS) 0.9 % injection 3 mL  3 mL Intravenous Q12H Leonie Douglas, MD        Allergies:   Clonidine derivatives, Imdur [isosorbide nitrate], and Tramadol   Social History:  The patient  reports that he has been smoking cigarettes. He has a 5.8 pack-year smoking history. He has been exposed to tobacco smoke. He has never used smokeless tobacco. He reports that he does not currently use alcohol after a past usage of about 1.0 standard drink of alcohol per week. He reports current drug use. Drug: Marijuana.   Family History:  The patient's family history includes Colon cancer in his paternal uncle; Diabetes in his maternal aunt; Headache in his sister; Heart attack in  his brother; Hypertension in his brother, mother, and another family member; Lung cancer in his maternal uncle; Other in his sister; Stroke in his maternal aunt and maternal uncle; Thyroid disease in his sister.   ROS:  Please see the history of present illness.   All other systems are personally reviewed and negative.   Recent Labs: 01/17/2023: B Natriuretic Peptide 1,461.9 01/19/2023: ALT 5; Magnesium 2.6 01/23/2023: BUN 61; Creatinine, Ser 7.85; Hemoglobin 8.0; Platelets 145; Potassium 4.3; Sodium 129  Personally reviewed   Wt Readings from Last 3 Encounters:  02/01/23 66 kg (145 lb 9.6 oz)  01/23/23 65.3 kg (143 lb 15.4 oz)  01/14/23 69.5 kg (153 lb 3.5 oz)   BP (!) 148/74   Pulse 95   Wt 66 kg (145 lb 9.6 oz)   SpO2 99%   BMI 20.31 kg/m   PHYSICAL EXAM General:  NAD. No resp difficulty, walked into clinic HEENT: Normal Neck: Supple. No JVD. Carotids 2+ bilat; no bruits. No lymphadenopathy or thryomegaly appreciated. Cor: PMI nondisplaced. Regular rate & rhythm. No rubs, gallops, 2/6 SEM RUSB  Lungs: Clear Abdomen: Soft, nontender, nondistended. No hepatosplenomegaly. No bruits or masses. Good bowel sounds. Extremities:  No cyanosis, clubbing, rash, edema; L AVF +/+ Neuro: Alert & oriented x 3, cranial nerves grossly intact. Moves all 4 extremities w/o difficulty. Affect pleasant.   ASSESSMENT AND PLAN:  1. CAD: 9/19 NSTEMI with PCI to proximal ramus.  LHC in 7/20 with intervenable disease.  Admission in 9/21 with Cardiolite showing no ischemia.  He has had long-standing atypical chest pain.  Last cath was in 11/22, showing small high 1st diagonal with diffuse severe disease, similar to prior, patent ramus stent. Otherwise, nonobstructive disease. LHC (11/24) showed chronically occluded small diagonal, patent stent in the Ramus intermediate, markedly aneurysmal cors; nonobstructive CAD, no change in angiography compared to prior study. He has a history of chronic chest pain. Currently being treated for pericarditis (see below).   Unable to tolerate long-acting nitrate. - Continue ASA 81.  - Continue atorvastatin and Repatha.   Having a hard time getting Repatha. Will notify Lipid Pharm D.  - Increase Coreg to 12.5 mg bid  2. 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 Myeloma panel negative and PYP scan negative, suspect due more likely to HTN than hypertrophic cardiomyopathy. Cannot get cardiac MRI with elevated creatinine. Echo (1/24) normal EF, AV gradient 20 mm hg consistent with previous.  Echo (10/24): EF 55-60%, G2DD, normal RV, moderate MR, Bioprosthetic AoV. Vmax 3.5 m/s, MG 31 mmHG, EOA 0.90 cm2, DI 0.26, AT 120 msec. NYHA II, volume managed by iHD (previously once a week, now 3x/week as he is getting IV abx).  - Increase Coreg to 12.5 mg bid 3. HTN:  Renal artery dopplers in 3/22 were not suggestive of significant renal artery stenosis.  Sleep study in 10/21 was negative. - BP up a bit today, but has not had AM meds yet. - Increase Coreg as above. - Continue hydralazine 50 mg tid.  - Continue amlodipine 10 mg daily.  4. S/P  Bioprothetic AVR: Echoes in 3/22 and 6/22 showed hyperdynamic LV with mean aortic valve gradient > 30.  TEE was done to investigate.  This showed bioprosthetic AoV that opened with minimal restriction, mean gradient 20 mmHg in setting of anesthesia and BP control, no AI.  Suspect small/undersized aortic valve (patient-prosthesis mismatch). Echo (1/24): normal EF. AV gradient 20 mm  hg consistent with previous.  Echo (10/24): bioprosthetic AoV Vmax 3.5 m/s, MG 31 mmHG, EOA 0.90cm2, DI 0.26, AT 120 msec. 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  TEE (10/24): LVEF 65%, moderate aortic stenosis.  - ? If subacute valve thrombosis responsible. He is not on anticoagulation. - Discussed with Dr. Shirlee Latch. Considered arranging for a coronary CTA to evaluate for subacute valve thrombosis. However, discussed case with Structural Heart PA, and his valve is 50 years old, and there is a high probability it is degenerative. Will refer to Structural heart team. 5. S/P 2016 and 2018 thoracic aortic aneurysm repair: Followed by Dr. Kizzie Bane at [facility].  CTA at [facility] in 3/23 showed stable repaired thoracic aorta. Aortic root repair 38 mm on echo (10/24) - Continue followup screening at [facility].  6. ESRD: Think this was due to controlled HTN. Previously on HD weekly, now 3x/week due to need for IV abx for MSSA bacteremia. 7. Active smoker: Has cut back to 2-3 cigarettes/day. - Discussed smoking cessation.  8. Erectile dysfunction:  prn Viagra but was counseled to avoid NTG when taking Viagra.  9. Palpitations: Continues with palpitations. Had ZIO placed 7/24, but no results. He says he mailed his patch back.  - Sleep study negative for OSA - Repeat 2 week Zio 10. Pericarditis: On prednisone taper, colchicine and ASA. 11. 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 TEE without vegetation. MRI lumbar spine w/p evidence of infection. On IV cefazolin (administered during dialysis sessions) until 02/22/23  Follow up in 6  months with Dr. Shirlee Latch.   Signed, Jacklynn Ganong, FNP  02/01/2023  [facility] [address] [facility] [place] [address] [phone] (office) [phone] (fax)

## 2023-02-01 NOTE — Progress Notes (Signed)
Zio patch placed onto patient.  All instructions and information reviewed with patient, they verbalize understanding with no questions. 

## 2023-02-01 NOTE — Patient Instructions (Addendum)
Thank you for coming in today  If you had labs drawn today, any labs that are abnormal the clinic will call you No news is good news  Your provider has recommended that  you wear a Zio Patch for 14 days.  This monitor will record your heart rhythm for our review.  IF you have any symptoms while wearing the monitor please press the button.  If you have any issues with the patch or you notice a red or orange light on it please call the company at [phone].  Once you remove the patch please mail it back to the company as soon as possible so we can get the results.   Medications: Increase Coreg to 12.5 mg twice daily   Follow up appointments:  Your physician recommends that you schedule a follow-up appointment in:  6 months With Dr. Earlean Shawl will receive a reminder letter in the mail a few months in advance. If you don't receive a letter, please call our office to schedule the follow-up appointment.    Do the following things EVERYDAY: Weigh yourself in the morning before breakfast. Write it down and keep it in a log. Take your medicines as prescribed Eat low salt foods--Limit salt (sodium) to 2000 mg per day.  Stay as active as you can everyday Limit all fluids for the day to less than 2 liters   At the [facility], you and your health needs are our priority. As part of our continuing mission to provide you with exceptional heart care, we have created designated Provider Care Teams. These Care Teams include your primary Cardiologist (physician) and Advanced Practice Providers (APPs- Physician Assistants and Nurse Practitioners) who all work together to provide you with the care you need, when you need it.   You may see any of the following providers on your designated Care Team at your next follow up: Dr Arvilla Meres Dr Marca Ancona Dr. Marcos Eke, NP Robbie Lis, [place] [facility] [place], [place] Brynda Peon, NP Karle Plumber,  PharmD   Please be sure to bring in all your medications bottles to every appointment.    Thank you for choosing [facility]  If you have any questions or concerns before your next appointment please send [name] a message through [place] or call our office at [phone].    TO LEAVE A MESSAGE FOR THE NURSE SELECT OPTION 2, PLEASE LEAVE A MESSAGE INCLUDING: YOUR NAME DATE OF BIRTH CALL BACK NUMBER REASON FOR CALL**this is important as we prioritize the call backs  YOU WILL RECEIVE A CALL BACK THE SAME DAY AS LONG AS YOU CALL BEFORE 4:00 PM

## 2023-02-03 ENCOUNTER — Other Ambulatory Visit: Payer: Self-pay

## 2023-02-03 ENCOUNTER — Encounter: Payer: Self-pay | Admitting: Internal Medicine

## 2023-02-03 ENCOUNTER — Ambulatory Visit: Payer: 59 | Attending: Internal Medicine | Admitting: Internal Medicine

## 2023-02-03 VITALS — BP 148/80 | HR 103 | Ht 71.0 in | Wt 137.2 lb

## 2023-02-03 DIAGNOSIS — Z953 Presence of xenogenic heart valve: Secondary | ICD-10-CM

## 2023-02-03 DIAGNOSIS — Z9889 Other specified postprocedural states: Secondary | ICD-10-CM | POA: Diagnosis not present

## 2023-02-03 DIAGNOSIS — I700 Atherosclerosis of aorta: Secondary | ICD-10-CM

## 2023-02-03 DIAGNOSIS — N186 End stage renal disease: Secondary | ICD-10-CM

## 2023-02-03 DIAGNOSIS — Z8679 Personal history of other diseases of the circulatory system: Secondary | ICD-10-CM | POA: Diagnosis not present

## 2023-02-03 DIAGNOSIS — I350 Nonrheumatic aortic (valve) stenosis: Secondary | ICD-10-CM

## 2023-02-03 DIAGNOSIS — I10 Essential (primary) hypertension: Secondary | ICD-10-CM

## 2023-02-03 DIAGNOSIS — E785 Hyperlipidemia, unspecified: Secondary | ICD-10-CM

## 2023-02-03 MED ORDER — METOPROLOL TARTRATE 100 MG PO TABS: ORAL_TABLET | ORAL | 0 refills | Status: DC

## 2023-02-03 NOTE — Progress Notes (Signed)
Patient ID: [name] MRN: [id number] DOB/AGE: 50/03/1970 50 y.o.  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 male with the indicated medical history here for recommendations regarding his bioprosthetic aortic valve degeneration.  The patient has a very complicated past medical history.  He first presented in 2016 with hypertensive emergency and a type B aortic dissection distal to the left subclavian.  He first underwent aortic valve replacement with a bovine pericardial tissue valve and ascending aortic graft implantation.  This was followed in a staged procedure 2 years later with endovascular repair of his type B dissection.  Echocardiograms in 2022 were consistent with bioprosthetic aortic valve degeneration with mean gradient of 20 mmHg.  Since that time he has been started on hemodialysis for end-stage renal disease.  He was recently mated to hospital for heart failure.  His troponins were mildly elevated and nuclear stress test was negative.  He underwent coronary angiography which demonstrated mild to moderate diffuse nonobstructive disease.   Echocardiogram in TEE were both consistent with bioprosthetic aortic valve degeneration.  He was diagnosed with bacteremia and had no evidence of endocarditis.  He is currently on antibiotics.  He is here for further recommendations.  The patient tells me that over the last several years he has been extremely fatigued and short of breath.  This did get quite worse after he had his fistula dilated recently.  He has noticed increasing shortness of breath and fatigue with most everything that he does.  He is not short of breath at rest.  He has been able to go to dialysis without any issues.  His dialysis sessions have not been cut short in any way.  He denies any syncope but has had dizziness at times.  He reports an atypical chest pain that radiates from the center of his chest backwards.  This does not seem to be associated with exertion.  He fortunately has not had any fevers or chills.  He is tolerating his antibiotic therapy well.  He is very disappointed about the fact that he can no longer play with his grandchildren as long or as much as he had been able to do maybe a year ago.  He has both upper and lower dentures.    Past Medical History:  Diagnosis Date   Adenomatous colon polyp 08/2015   Anxiety    Aortic disease (HCC)    Aortic dissection (HCC)    a. admx 04/2014 >> L renal infarct; a/c renal failure >> b.  s/p Bioprosthetic Bentall and total arch replacement and staged endovascular repair of descending aortic aneurysm ([facility] - Dr. Kizzie Bane)   CAD (coronary artery disease)    a. LHC 4/16:  oD1 60%   Cardiomyopathy (HCC)    a. non-ischemic - probably related to untreated HTN and ETOH  abuse - Echo 3/13 with EF 35-40% >> b. Echo 4/16: Severe LVH, EF 55-60%, moderate AI, moderate MR, mild LAE, trivial effusion, known type B dissection with communication between true and false lumens with suprasternal images suggesting dissection plane may propagate to at least left subclavian takeoff, root above  aortic valve okay     Chronic abdominal pain    Chronic combined systolic and diastolic congestive heart failure (HCC)    a. 05/2011: Adm with pulm edema/HTN urgency, EF 35-40% with diffuse hypokinesis and moderate to severe mitral regurgitation. Cardiomyopathy likely due to uncontrolled HTN and ETOH abuse - cath deferred due to renal insufficiency (felt due to uncontrolled HTN). bElzie Rings MV 06/2011: EF 37% and no ischemia or infarction. c. EF 45-50% by echo 01/2012.   Chronic sinusitis    DDD (degenerative disc disease), lumbar    Depression    Descending thoracic aortic aneurysm (HCC)    Dissecting aneurysm of thoracic aorta (HCC)    ESRD (end stage renal disease) (HCC)    on dialysis 1-2 days per week (2 days ordered, only coming 1 day per week as of oct 2024). on dialysis 3-4 years total   ETOH abuse    a. Reported to have quit 05/2011.   Frequent headaches    GERD (gastroesophageal reflux disease)    Headache(784.0)    "q other day" (08/08/2013)   Heart murmur    Hemorrhoid thrombosis    History of echocardiogram    Echo 1/17:  Severe LVH, EF 55-60%, no RWMA, Gr 2 DD, AVR ok, mild to mod MR, mild LAE, mild reduced RVSF, mod RAE   History of medication noncompliance    HYPERLIPIDEMIA    Hypertension    a. Hx of HTN urgency secondary to noncompliance. b. urinary metanephrine and catecholeamine levels normal 2013.  c. Renal art [name] 1/16:  No evidence of renal artery stenosis noted bilaterally.   INGUINAL HERNIA    Pneumonia ~ 2013   Serrated adenoma of colon 08/2015   Stroke [facility])    Tobacco abuse    Valvular heart disease    a. Echo 05/2011: moderate to severe eccentric MR and mild to moderate AI with prolapsing left coronary cusp. b. Echo 01/2012: mild-mod AI, mild dilitation of aortic root, mild MR.;  c. Echo 1/16: Severe LVH consistent with hypertrophic cardio myopathy, EF 50%, no RWMA, mod AI, mild MR, mild RAE, dilated Ao root (40 mm);     Past Surgical History:  Procedure  Laterality Date   A/V FISTULAGRAM Left 03/07/2021   Procedure: A/V FISTULAGRAM;  Surgeon: Chuck Hint, MD;  Location: [facility] INVASIVE CV LAB;  Service: Cardiovascular;  Laterality: Left;   A/V FISTULAGRAM Left 07/18/2021   Procedure: A/V Fistulagram;  Surgeon: Chuck Hint, MD;  Location: [facility] INVASIVE CV LAB;  Service: Cardiovascular;  Laterality: Left;   A/V FISTULAGRAM Left 12/25/2022   Procedure: A/V Fistulagram;  Surgeon: Leonie Douglas, MD;  Location: [facility] INVASIVE CV LAB;  Service: Cardiovascular;  Laterality: Left;   ANKLE SURGERY Bilateral    Fractures bilaterally   AORTIC VALVE SURGERY  09/2014   AV FISTULA PLACEMENT Left 09/09/2020   Procedure: LEFT ARM ARTERIOVENOUS (AV) FISTULA CREATION;  Surgeon: Leonie Douglas, MD;  Location: [facility] OR;  Service: Vascular;  Laterality: Left;   BASCILIC VEIN TRANSPOSITION Left 10/09/2020   Procedure: LEFT SECOND STAGE BASILIC VEIN TRANSPOSITION;  Surgeon: Leonie Douglas, MD;  Location: [facility] OR;  Service: Vascular;  Laterality: Left;  PERIPHERAL  NERVE BLOCK   CORONARY ANGIOGRAPHY N/A 12/06/2017   Procedure: CORONARY ANGIOGRAPHY;  Surgeon: Lyn Records, MD;  Location: [facility] INVASIVE CV LAB;  Service: Cardiovascular;  Laterality: N/A;   CORONARY ANGIOGRAPHY N/A 09/26/2018   Procedure: CORONARY ANGIOGRAPHY;  Surgeon: Yvonne Kendall, MD;  Location: [facility] INVASIVE CV LAB;  Service: Cardiovascular;  Laterality: N/A;   CORONARY STENT INTERVENTION N/A 12/10/2017   Procedure: CORONARY STENT INTERVENTION;  Surgeon: Lennette Bihari, MD;  Location: [facility] INVASIVE CV LAB;  Service: Cardiovascular;  Laterality: N/A;   FOOT FRACTURE SURGERY Bilateral 2004-2010   "got pins in both of them"   HEMORRHOID SURGERY N/A 06/15/2015   Procedure: HEMORRHOIDECTOMY;  Surgeon: Almond Lint, MD;  Location: [facility] OR;  Service: General;  Laterality: N/A;   INGUINAL HERNIA REPAIR Right ~ 1996   INSERTION OF DIALYSIS CATHETER N/A 09/09/2020   Procedure: INSERTION OF TUNNELED  DIALYSIS PALINDROME 19cm CATHETER;  Surgeon: Leonie Douglas, MD;  Location: [facility] OR;  Service: Vascular;  Laterality: N/A;   LEFT HEART CATH AND CORONARY ANGIOGRAPHY N/A 02/05/2021   Procedure: LEFT HEART CATH AND CORONARY ANGIOGRAPHY;  Surgeon: Laurey Morale, MD;  Location: [facility] INVASIVE CV LAB;  Service: Cardiovascular;  Laterality: N/A;   LEFT HEART CATH AND CORONARY ANGIOGRAPHY N/A 01/22/2023   Procedure: LEFT HEART CATH AND CORONARY ANGIOGRAPHY;  Surgeon: [place], Peter M, MD;  Location: [facility] INVASIVE CV LAB;  Service: Cardiovascular;  Laterality: N/A;   LEFT HEART CATHETERIZATION WITH CORONARY ANGIOGRAM N/A 06/21/2014   Procedure: LEFT HEART CATHETERIZATION WITH CORONARY ANGIOGRAM;  Surgeon: Laurey Morale, MD;  Location: [facility] CATH LAB;  Service: Cardiovascular;  Laterality: N/A;   LOOP RECORDER INSERTION N/A 06/19/2016   Procedure: Loop Recorder Insertion;  Surgeon: [facility] Salvia, MD;  Location: [facility] INVASIVE CV LAB;  Service: Cardiovascular;  Laterality: N/A;   PERIPHERAL VASCULAR BALLOON ANGIOPLASTY  03/07/2021   Procedure: PERIPHERAL VASCULAR BALLOON ANGIOPLASTY;  Surgeon: Chuck Hint, MD;  Location: [facility] INVASIVE CV LAB;  Service: Cardiovascular;;   PERIPHERAL VASCULAR BALLOON ANGIOPLASTY  07/18/2021   Procedure: PERIPHERAL VASCULAR BALLOON ANGIOPLASTY;  Surgeon: Chuck Hint, MD;  Location: [facility] INVASIVE CV LAB;  Service: Cardiovascular;;   PERIPHERAL VASCULAR INTERVENTION Left 12/25/2022   Procedure: PERIPHERAL VASCULAR INTERVENTION;  Surgeon: Leonie Douglas, MD;  Location: [facility] INVASIVE CV LAB;  Service: Cardiovascular;  Laterality: Left;  stent to brachiocephalic vein   PERIPHERAL VASCULAR ULTRASOUND/IVUS Left 12/25/2022   Procedure: Peripheral Vascular Ultrasound/IVUS;  Surgeon: Leonie Douglas, MD;  Location: [facility] INVASIVE CV LAB;  Service: Cardiovascular;  Laterality: Left;   TEE WITHOUT CARDIOVERSION N/A 03/12/2016   Procedure: TRANSESOPHAGEAL ECHOCARDIOGRAM (TEE);  Surgeon:  Thurmon Fair, MD;  Location: [facility] ENDOSCOPY;  Service: Cardiovascular;  Laterality: N/A;   TEE WITHOUT CARDIOVERSION N/A 10/16/2020   Procedure: TRANSESOPHAGEAL ECHOCARDIOGRAM (TEE);  Surgeon: Laurey Morale, MD;  Location: [facility] ENDOSCOPY;  Service: Cardiovascular;  Laterality: N/A;   TRANSESOPHAGEAL ECHOCARDIOGRAM (CATH LAB) N/A 01/13/2023   Procedure: TRANSESOPHAGEAL ECHOCARDIOGRAM;  Surgeon: Quintella Reichert, MD;  Location: [facility] INVASIVE CV LAB;  Service: Cardiovascular;  Laterality: N/A;   UPPER EXTREMITY VENOGRAPHY Right 07/18/2021   Procedure: UPPER EXTREMITY VENOGRAPHY;  Surgeon: Chuck Hint, MD;  Location: [facility] INVASIVE CV LAB;  Service: Cardiovascular;  Laterality: Right;    Family History  Problem Relation Age of Onset   Hypertension Mother    Hypertension Other    Colon cancer Paternal Uncle    Stroke Maternal Aunt    Heart attack Brother  Hypertension Brother    Diabetes Maternal Aunt    Lung cancer Maternal Uncle    Stroke Maternal Uncle    Other Sister        "breathing machine at night"   Headache Sister    Thyroid disease Sister    Throat cancer Neg Hx    Pancreatic cancer Neg Hx    Esophageal cancer Neg Hx    Kidney disease Neg Hx    Liver disease Neg Hx     Social History   Socioeconomic History   Marital status: Married    Spouse name: Not on file   Number of children: 5   Years of education: 12   Highest education level: Not on file  Occupational History   Occupation: Disabled, part-time Corporate investment banker    Comment: Disability  Tobacco Use   Smoking status: Some Days    Current packs/day: 0.25    Average packs/day: 0.3 packs/day for 23.0 years (5.8 ttl pk-yrs)    Types: Cigarettes    Passive exposure: Current   Smokeless tobacco: Never   Tobacco comments:    3 to 4 per day  Vaping Use   Vaping status: Not on file  Substance and Sexual Activity   Alcohol use: Not Currently    Alcohol/week: 1.0 standard drink of alcohol    Types: 1 Cans of  beer per week    Comment: On special occasion   Drug use: Yes    Types: Marijuana    Comment: 2 times/week - last use 10/06/20   Sexual activity: Yes  Other Topics Concern   Not on file  Social History Narrative   Fun: Enjoy his children   Social Determinants of Health   Financial Resource Strain: Low Risk  (07/07/2022)   Received from [facility]   Overall Financial Resource Strain (CARDIA)    Difficulty of Paying Living Expenses: Not hard at all  Food Insecurity: No Food Insecurity (01/17/2023)   Hunger Vital Sign    Worried About Running Out of Food in the Last Year: Never true    Ran Out of Food in the Last Year: Never true  Transportation Needs: No Transportation Needs (01/17/2023)   PRAPARE - Administrator, Civil Service (Medical): No    Lack of Transportation (Non-Medical): No  Physical Activity: Not on file  Stress: Not on file  Social Connections: Unknown (05/11/2022)   [facility]   Social Network    Social Network: Not on file  Intimate Partner Violence: Not At Risk (01/17/2023)   Humiliation, Afraid, Rape, and Kick questionnaire    Fear of Current or Ex-Partner: No    Emotionally Abused: No    Physically Abused: No    Sexually Abused: No     Prior to Admission medications   Medication Sig Start Date End Date Taking? Authorizing Provider  acetaminophen (TYLENOL) 325 MG tablet Take 2 tablets (650 mg total) by mouth every 4 (four) hours as needed. 01/14/23 01/14/24  Burnadette Pop, MD  albuterol (VENTOLIN HFA) 108 (90 Base) MCG/ACT inhaler Inhale 1 puff into the lungs every 6 (six) hours as needed for wheezing or shortness of breath. 05/24/21   [provider]  amLODipine (NORVASC) 10 MG tablet TAKE 1 TABLET(10 MG) BY MOUTH DAILY 07/15/21   Laurey Morale, MD  aspirin EC 81 MG EC tablet Take 1 tablet (81 mg total) by mouth daily. 12/12/17   Marcelino Duster, PA  carvedilol (  COREG) 12.5 MG tablet  Take 1 tablet (12.5 mg total) by mouth 2 (two) times daily. 02/01/23 05/02/23  Jacklynn Ganong, FNP  colchicine 0.6 MG tablet Take 0.5 tablets (0.3 mg total) by mouth daily. 01/23/23   Marinda Elk, MD  Evolocumab (REPATHA SURECLICK) 140 MG/ML SOAJ Inject 140 mg into the skin every 14 (fourteen) days. 10/28/22   Hilty, Lisette Abu, MD  hydrALAZINE (APRESOLINE) 100 MG tablet Take 0.5 tablets (50 mg total) by mouth 3 (three) times daily. 01/14/23   Burnadette Pop, MD  lidocaine-prilocaine (EMLA) cream Apply 1 application. topically as needed (prior to port being accessed). Use on dialysis days (Tues, Thurs and Sat)    [provider]  meclizine (ANTIVERT) 25 MG tablet Take 1 tablet (25 mg total) by mouth 3 (three) times daily as needed for dizziness. 12/21/22   Vanetta Mulders, MD  nitroGLYCERIN (NITROSTAT) 0.4 MG SL tablet Place 1 tablet (0.4 mg total) under the tongue every 5 (five) minutes as needed for chest pain. 01/14/23   Burnadette Pop, MD  oxyCODONE-acetaminophen (PERCOCET) 5-325 MG tablet Take 1 tablet by mouth every 4 (four) hours as needed for severe pain. 12/25/22 12/25/23  Leonie Douglas, MD  pantoprazole (PROTONIX) 40 MG tablet Take 1 tablet (40 mg total) by mouth daily. 01/23/23   Marinda Elk, MD  polyethylene glycol powder (GLYCOLAX/MIRALAX) 17 GM/SCOOP powder Take 17 g by mouth daily. 01/14/23   Burnadette Pop, MD  predniSONE (DELTASONE) 5 MG tablet Take 3 tablets (15 mg total) by mouth daily with breakfast. Take 15 mg daily for 3 weeks then take 10 mg for a week then 5 mg for a week then stop 01/23/23   Marinda Elk, MD  senna-docusate (SENOKOT-S) 8.6-50 MG tablet Take 2 tablets by mouth 2 (two) times daily. 01/14/23   Burnadette Pop, MD  STIOLTO RESPIMAT 2.5-2.5 MCG/ACT AERS Inhale 2 puffs into the lungs at bedtime. 12/29/22   [provider]    Allergies  Allergen Reactions   Clonidine Derivatives Nausea And Vomiting and Other (See  Comments)    Tablets by mouth = Tolerated  Patches on the skin = Nausea & vomiting after wearing 4-5 days    Imdur [Isosorbide Nitrate] Other (See Comments)    Headaches -- patient instructed to continue taking    Tramadol Other (See Comments)    Headaches     REVIEW OF SYSTEMS:  General: no fevers/chills/night sweats Eyes: no blurry vision, diplopia, or amaurosis ENT: no sore throat or hearing loss Resp: no cough, wheezing, or hemoptysis CV: no edema or palpitations GI: no abdominal pain, nausea, vomiting, diarrhea, or constipation GU: no dysuria, frequency, or hematuria Skin: no rash Neuro: no headache, numbness, tingling, or weakness of extremities Musculoskeletal: no joint pain or swelling Heme: no bleeding, DVT, or easy bruising Endo: no polydipsia or polyuria  BP (!) 148/80   Pulse (!) 103   Ht 5\' 11"  (1.803 m)   Wt 137 lb 3.2 oz (62.2 kg)   SpO2 95%   BMI 19.14 kg/m   PHYSICAL EXAM: GEN:  AO x 3 in no acute distress HEENT: normal Dentition: Dentures Neck: JVP normal. +2 carotid upstrokes without bruits. No thyromegaly. Lungs: equal expansion, clear bilaterally CV: Apex is discrete and nondisplaced, RRR with 3/6 SEM Abd: soft, non-tender, non-distended; no bruit; positive bowel sounds Ext: no edema, ecchymoses, or cyanosis; left upper extremity fistula Vascular: 2+ femoral pulses, 2+ radial pulses       Skin: warm and  dry without rash Neuro: CN II-XII grossly intact; motor and sensory grossly intact    DATA AND STUDIES:  EKG: EKG from November 2024 demonstrates sinus rhythm with left ventricular hypertrophy  EKG Interpretation Date/Time:    Ventricular Rate:    PR Interval:    QRS Duration:    QT Interval:    QTC Calculation:   R Axis:      Text Interpretation:          Cardiac Studies & Procedures   CARDIAC CATHETERIZATION  CARDIAC CATHETERIZATION 01/22/2023  Narrative   Prox LAD to Mid LAD lesion is 30% stenosed.   Mid LAD lesion is 40%  stenosed.   Prox Cx lesion is 40% stenosed.   Mid Cx lesion is 40% stenosed.   Prox RCA lesion is 40% stenosed.   1st Diag lesion is 95% stenosed.   Non-stenotic Ost Ramus lesion was previously treated.  Nonobstructive CAD except for chronically occluded small diagonal. Patent stent in the Ramus intermediate. Markedly aneurysmal coronary arteries. No change in angiography compared to prior study.  Continue medical management.  Findings Coronary Findings Diagnostic  Dominance: Right  Left Anterior Descending Prox LAD to Mid LAD lesion is 30% stenosed. Mid LAD lesion is 40% stenosed.  First Diagonal Branch 1st Diag lesion is 95% stenosed.  Ramus Intermedius Non-stenotic Ost Ramus lesion was previously treated. Previously placed stent displays no restenosis.  Left Circumflex Prox Cx lesion is 40% stenosed. Mid Cx lesion is 40% stenosed.  Right Coronary Artery Prox RCA lesion is 40% stenosed.  Intervention  No interventions have been documented.   CARDIAC CATHETERIZATION  CARDIAC CATHETERIZATION 02/05/2021  Narrative   Prox LAD to Mid LAD lesion is 30% stenosed.   Prox RCA lesion is 40% stenosed.   Prox Cx lesion is 40% stenosed.   Mid Cx lesion is 40% stenosed.   1st Diag lesion is 95% stenosed.   Mid LAD lesion is 40% stenosed.   Non-stenotic Ost Ramus lesion was previously treated.  1. Small high 1st diagonal with diffuse severe disease, similar to prior. 2. Otherwise, nonobstructive disease.  No interventional target.  Findings Coronary Findings Diagnostic  Dominance: Right  Left Anterior Descending Prox LAD to Mid LAD lesion is 30% stenosed. Mid LAD lesion is 40% stenosed.  First Diagonal Branch 1st Diag lesion is 95% stenosed.  Ramus Intermedius Non-stenotic Ost Ramus lesion was previously treated. Previously placed stent displays no restenosis.  Left Circumflex Prox Cx lesion is 40% stenosed. Mid Cx lesion is 40% stenosed.  Right Coronary  Artery Prox RCA lesion is 40% stenosed.  Intervention  No interventions have been documented.   STRESS TESTS  NM MYOCAR MULTI W/SPECT W 01/20/2023  Narrative   Findings are consistent with infarction. The study is intermediate risk.   ST depression was noted.  Downsloping ST depression noted in leads V5-V6 (worse than baseline). Very mild horizontal depression also noted in lead II.   LV perfusion is abnormal. There is no evidence of ischemia. There is evidence of infarction. Defect 1: There is a medium defect with moderate reduction in uptake present in the mid to basal inferolateral location(s) that is fixed. Consistent with infarction.   ECHOCARDIOGRAM  ECHOCARDIOGRAM COMPLETE 01/10/2023  Narrative ECHOCARDIOGRAM REPORT    Patient Name:   [name] Date of Exam: 01/10/2023 Medical Rec #:  [id number]     Height:       71.0 in Accession #:    [id number]    Weight:  132.0 lb Date of Birth:  Nov 23, 1970      BSA:          1.767 m Patient Age:    52 years      BP:           143/78 mmHg Patient Gender: M             HR:           86 bpm. Exam Location:  Inpatient  Procedure: 2D Echo, Cardiac Doppler and Color Doppler  Indications:     CHF-Acute Diastolic I50.31  History:         Patient has prior history of Echocardiogram examinations, most recent 04/15/2022. Cardiomyopathy, CAD and Previous Myocardial Infarction, ETOH abuse, Stroke and COPD, Aortic Valve Disease and Mitral Valve Disease; Risk Factors:Current Smoker, Hypertension and Dyslipidemia. Aortic dissection repair.  Sonographer:     Dondra Prader RVT RCS Referring Phys:  [id number] [facility] ADEFESO Diagnosing Phys: Lennie Odor MD  IMPRESSIONS   1. Left ventricular ejection fraction, by estimation, is 55 to 60%. The left ventricle has normal function. The left ventricle has no regional wall motion abnormalities. There is severe concentric left ventricular hypertrophy. Left ventricular diastolic parameters are  consistent with Grade II diastolic dysfunction (pseudonormalization). 2. Right ventricular systolic function is normal. The right ventricular size is normal. Tricuspid regurgitation signal is inadequate for assessing PA pressure. 3. Left atrial size was severely dilated. 4. Right atrial size was mild to moderately dilated. 5. The mitral valve is grossly normal. Moderate mitral valve regurgitation. No evidence of mitral stenosis. 6. Bioprosthetic AoV. Vmax 3.5 m/s, MG 31 mmHG, EOA 0.90cm2, DI 0.26, AT 120 msec. 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 The aortic valve has been repaired/replaced. Aortic valve regurgitation is not visualized. Severe aortic valve stenosis. Procedure Date: 11/2014. Echo findings are consistent with stenosis of the aortic prosthesis. Aortic valve acceleration time measures 120 msec. 7. Aortic root repair measures 38 mm. Aortic root/ascending aorta has been repaired/replaced. 8. The inferior vena cava is normal in size with greater than 50% respiratory variability, suggesting right atrial pressure of 3 mmHg.  Comparison(s): Changes from prior study are noted. Aortic prosthetic valve with concerns for severe stenosis on this study.  FINDINGS Left Ventricle: Left ventricular ejection fraction, by estimation, is 55 to 60%. The left ventricle has normal function. The left ventricle has no regional wall motion abnormalities. The left ventricular internal cavity size was normal in size. There is severe concentric left ventricular hypertrophy. Abnormal (paradoxical) septal motion consistent with post-operative status. Left ventricular diastolic parameters are consistent with Grade II diastolic dysfunction (pseudonormalization).  Right Ventricle: The right ventricular size is normal. No increase in right ventricular wall thickness. Right ventricular systolic function is normal. Tricuspid regurgitation signal is inadequate for assessing PA pressure.  Left  Atrium: Left atrial size was severely dilated.  Right Atrium: Right atrial size was mild to moderately dilated.  Pericardium: There is no evidence of pericardial effusion.  Mitral Valve: The mitral valve is grossly normal. Moderate mitral valve regurgitation. No evidence of mitral valve stenosis.  Tricuspid Valve: The tricuspid valve is grossly normal. Tricuspid valve regurgitation is mild . No evidence of tricuspid stenosis.  Aortic Valve: Bioprosthetic AoV. Vmax 3.5 m/s, MG 31 mmHG, EOA 0.90cm2, DI 0.26, AT 120 msec. 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 The aortic valve has been repaired/replaced. Aortic valve regurgitation is not visualized. Severe aortic stenosis is present. Aortic valve mean gradient  measures 31.2 mmHg. Aortic valve peak gradient measures 50.6 mmHg. Aortic valve area, by VTI measures 0.90 cm. There is a unknown bioprosthetic valve present in the aortic position.  Pulmonic Valve: The pulmonic valve was grossly normal. Pulmonic valve regurgitation is not visualized. No evidence of pulmonic stenosis.  Aorta: Aortic root repair measures 38 mm. The aortic root/ascending aorta has been repaired/replaced.  Venous: The inferior vena cava is normal in size with greater than 50% respiratory variability, suggesting right atrial pressure of 3 mmHg.  IAS/Shunts: The atrial septum is grossly normal.   LEFT VENTRICLE PLAX 2D LVIDd:         5.10 cm   Diastology LVIDs:         3.50 cm   LV e' medial:    5.98 cm/s LV PW:         1.90 cm   LV E/e' medial:  13.5 LV IVS:        1.70 cm   LV e' lateral:   7.51 cm/s LVOT diam:     2.10 cm   LV E/e' lateral: 10.7 LV SV:         69 LV SV Index:   39 LVOT Area:     3.46 cm   RIGHT VENTRICLE             IVC RV Basal diam:  3.20 cm     IVC diam: 1.00 cm RV S prime:     12.90 cm/s TAPSE (M-mode): 2.3 cm  LEFT ATRIUM              Index        RIGHT ATRIUM           Index LA Vol (A2C):   114.0 ml 64.51  ml/m  RA Area:     22.40 cm LA Vol (A4C):   89.7 ml  50.76 ml/m  RA Volume:   69.20 ml  39.16 ml/m LA Biplane Vol: 117.0 ml 66.21 ml/m AORTIC VALVE                     PULMONIC VALVE AV Area (Vmax):    1.13 cm      PR End Diast Vel: 6.15 msec AV Area (Vmean):   1.12 cm AV Area (VTI):     0.90 cm AV Vmax:           355.73 cm/s AV Vmean:          238.367 cm/s AV VTI:            0.764 m AV Peak Grad:      50.6 mmHg AV Mean Grad:      31.2 mmHg LVOT Vmax:         116.00 cm/s LVOT Vmean:        76.900 cm/s LVOT VTI:          0.199 m LVOT/AV VTI ratio: 0.26  AORTA Ao Root diam: 3.80 cm  MITRAL VALVE MV Area (PHT): 3.99 cm    SHUNTS MV Decel Time: 190 msec    Systemic VTI:  0.20 m MV E velocity: 80.50 cm/s  Systemic Diam: 2.10 cm MV A velocity: 77.20 cm/s MV E/A ratio:  1.04  Lennie Odor MD Electronically signed by Lennie Odor MD Signature Date/Time: 01/10/2023/10:15:25 AM    Final (Updated)   TEE  ECHO TEE 01/13/2023  Narrative TRANSESOPHOGEAL ECHO REPORT    Patient Name:   [name] Date of Exam: 01/13/2023 Medical  Rec #:  [id number]     Height:       71.0 in Accession #:    [id number]    Weight:       144.8 lb Date of Birth:  01-Apr-1970      BSA:          1.838 m Patient Age:    52 years      BP:           150/81 mmHg Patient Gender: M             HR:           92 bpm. Exam Location:  Inpatient  Procedure: Transesophageal Echo, Color Doppler and Cardiac Doppler  Indications:     Bacteremia  History:         Patient has prior history of Echocardiogram examinations, most recent 01/10/2023. CHF, CAD, Prior Cardiac Surgery, COPD and ESRD, h/o AVR with a 27mm Pericard Mitroflow bioprosthetic and subsequent total arch replacement in 2016 at [facility], Signs/Symptoms:Bacteremia; Risk Factors:Current Smoker, Dyslipidemia, Hypertension and ETOH Abuse. Aortic Valve: 27 mm Mitroflow pericardial valve is present in the aortic position.  Sonographer:      Milbert Coulter Referring Phys:  [id number] [facility] Diagnosing Phys: Armanda Magic MD  PROCEDURE: After discussion of the risks and benefits of a TEE, an informed consent was obtained from the patient. The transesophogeal probe was passed without difficulty through the esophogus of the patient. Imaged were obtained with the patient in a left lateral decubitus position. Sedation performed by different physician. The patient was monitored while under deep sedation. Anesthestetic sedation was provided intravenously by Anesthesiology: 144.98mg  of Propofol, 100mg  of Lidocaine. Image quality was good. The patient's vital signs; including heart rate, blood pressure, and oxygen saturation; remained stable throughout the procedure. The patient developed no complications during the procedure.  IMPRESSIONS   1. Left ventricular ejection fraction, by estimation, is 60 to 65%. The left ventricle has normal function. The left ventricle has no regional wall motion abnormalities. There is severe concentric left ventricular hypertrophy. 2. Right ventricular systolic function is normal. The right ventricular size is normal. 3. Left atrial size was severely dilated. No left atrial/left atrial appendage thrombus was detected. 4. Right atrial size was moderately dilated. 5. The mitral valve is normal in structure. Moderate mitral valve regurgitation. No evidence of mitral stenosis. 6. The aortic valve has been repaired/replaced. There is moderate calcification of the aortic valve prosthesis. There is moderate thickening of the aortic valve prosthesis. Aortic valve regurgitation is trivial.. There is a 27 mm Mitroflow pericardial valve present in the aortic position. The AVR is not well visualized but there is at least moderate degeneration of the valve with at least moderate aortic stenosis by visualization. 7. The inferior vena cava is normal in size with greater than 50% respiratory variability, suggesting right  atrial pressure of 3 mmHg. 8. Aortic root/ascending aorta has been repaired/replaced.  Conclusion(s)/Recommendation(s): Normal biventricular function with no obvious vegetations. There is at least moderate stenosis of the AV prosthesis. FINDINGS Left Ventricle: Left ventricular ejection fraction, by estimation, is 60 to 65%. The left ventricle has normal function. The left ventricle has no regional wall motion abnormalities. The left ventricular internal cavity size was normal in size. There is severe concentric left ventricular hypertrophy.  Right Ventricle: The right ventricular size is normal. No increase in right ventricular wall thickness. Right ventricular systolic function is normal.  Left [facility]: Left atrial size was severely dilated. Spontaneous echo  contrast was present in the left atrium. No left atrial/left atrial appendage thrombus was detected.  Right [facility]: Right atrial size was moderately dilated.  Pericardium: There is no evidence of pericardial effusion.  Mitral Valve: The mitral valve is normal in structure. Moderate mitral valve regurgitation, with centrally-directed jet. No evidence of mitral valve stenosis.  Tricuspid Valve: The tricuspid valve is normal in structure. Tricuspid valve regurgitation is mild . No evidence of tricuspid stenosis.  Aortic Valve: The aortic valve has been repaired/replaced. There is moderate calcification of the aortic valve. There is moderate thickening of the aortic valve. Aortic valve regurgitation is trivial. No aortic stenosis is present. There is a 27 mm Mitroflow pericardial valve present in the aortic position.  Pulmonic Valve: The pulmonic valve was normal in structure. Pulmonic valve regurgitation is trivial. No evidence of pulmonic stenosis.  Aorta: The aortic root/ascending aorta has been repaired/replaced.  Venous: The inferior vena cava is normal in size with greater than 50% respiratory variability, suggesting right atrial  pressure of 3 mmHg.  IAS/Shunts: No atrial level shunt detected by color flow Doppler.  Additional Comments: Spectral Doppler performed.  Armanda Magic MD Electronically signed by Armanda Magic MD Signature Date/Time: 01/13/2023/11:14:53 AM    Final   MONITORS  LONG TERM MONITOR (3-14 DAYS) 12/02/2022           01/17/2023: B Natriuretic Peptide 1,461.9 01/19/2023: ALT 5; Magnesium 2.6 01/23/2023: BUN 61; Creatinine, Ser 7.85; Hemoglobin 8.0; Platelets 145; Potassium 4.3; Sodium 129   STS RISK CALCULATOR: Pending  NHYA CLASS: 2  ASSESSMENT AND PLAN:   S/P aortic valve replacement with bioprosthetic valve  Nonrheumatic aortic valve stenosis  History of aortic dissection  S/P aortic dissection repair  ESRD (end stage renal disease) (HCC)  Aortic atherosclerosis (HCC)  Essential hypertension  Hyperlipidemia LDL goal <70  The patient has developed bioprosthetic aortic valve degeneration with NYHA II class symptoms.  The patient is quite young and also has end-stage renal disease as well as a very complex cardiovascular history.  I reviewed his coronary angiogram which demonstrated modest disease.  I think it is reasonable to consider an aortic valve intervention in this patient to relieve symptoms.  We will obtain a TAVR protocol CTA and cardiothoracic surgical opinion.  The patient has both upper and lower dentures so a dental evaluation is not required.  Though he is young and has end-stage renal disease I am worried about the need for reintervention in the future.  Evaluating his CT scan will be very important.  If supra annular valve is placed which may be associated with the better gradient if this were to degenerate then a SAPIEN valve would need to be placed between nodes 4-6 (higher risk plane) and due to the position of his coronary reimplantation this may pose an issue.  If his SAPIEN valve is placed initially and reintervention is needed then a supra annular valve could  be placed without any penalty I believe.  The problem with this approach is I would expect a higher mean gradient and therefore potentially lower durability.  However a fracture could be pursued which would perhaps ensure an improved hemodynamic result.  Further recommendations will be issued once the TAVR protocol CTA has been carefully reviewed.  I have personally reviewed the patients imaging data as summarized above.  I have reviewed the natural history of aortic stenosis with the patient and family members who are present today. We have discussed the limitations of medical therapy and the  poor prognosis associated with symptomatic aortic stenosis. We have also reviewed potential treatment options, including palliative medical therapy, conventional surgical aortic valve replacement, and transcatheter aortic valve replacement. We discussed treatment options in the context of this patient's specific comorbid medical conditions.   All of the patient's questions were answered today. Will make further recommendations based on the results of studies outlined above.   I spent 55 minutes reviewing all clinical data during and prior to this visit including all relevant imaging studies, laboratories, clinical information from other health systems and prior notes from both Cardiology and other specialties, interviewing the patient, conducting a complete physical examination, and coordinating care in order to formulate a comprehensive and personalized evaluation and treatment plan.   Orbie Pyo, MD  02/03/2023 2:51 PM    [facility] [address] [place], [place], [place]  [address] Phone: [phone]; Fax: [phone]

## 2023-02-03 NOTE — Progress Notes (Addendum)
Pre Surgical Assessment: 5 M Walk Test  26M=16.41ft  5 Meter Walk Test- trial 1: 8.16 seconds 5 Meter Walk Test- trial 2: 7.07 seconds 5 Meter Walk Test- trial 3: 6.99 seconds 5 Meter Walk Test Average: 7.41 seconds  ______________________  Procedure Type: Isolated AVR Perioperative Outcome Estimate % Operative Mortality 11.3% Morbidity & Mortality 42% Stroke 4.1% Renal Failure NA Reoperation 9.24% Prolonged Ventilation 35.8% Deep Sternal Wound Infection 0.561% Long Hospital Stay (>14 days) 37.1% Short Hospital Stay (<6 days)* 6.96%

## 2023-02-03 NOTE — Addendum Note (Signed)
Encounter addended by: Jacklynn Ganong, FNP on: 02/03/2023 2:35 PM  Actions taken: Clinical Note Signed

## 2023-02-03 NOTE — Patient Instructions (Addendum)
Medication Instructions:  No changes today *If you need a refill on your cardiac medications before your next appointment, please call your pharmacy*   Lab Work: none  Testing/Procedures: CT scans - see instructions given today  Follow-Up: Per Structural Heart Team

## 2023-02-10 ENCOUNTER — Encounter: Payer: Self-pay | Admitting: Physician Assistant

## 2023-02-10 ENCOUNTER — Ambulatory Visit (HOSPITAL_COMMUNITY)
Admission: RE | Admit: 2023-02-10 | Discharge: 2023-02-10 | Disposition: A | Discharge status: Home / Self Care | Payer: 59 | Source: Ambulatory Visit | Attending: Registered Nurse | Admitting: Registered Nurse

## 2023-02-10 DIAGNOSIS — I350 Nonrheumatic aortic (valve) stenosis: Secondary | ICD-10-CM | POA: Insufficient documentation

## 2023-02-10 DIAGNOSIS — Z953 Presence of xenogenic heart valve: Secondary | ICD-10-CM | POA: Insufficient documentation

## 2023-02-10 MED ORDER — IOHEXOL 350 MG/ML SOLN
95.0000 mL | Freq: Once | INTRAVENOUS | Status: AC | PRN
  Administered 2023-02-10: 95 mL via INTRAVENOUS

## 2023-02-11 ENCOUNTER — Other Ambulatory Visit: Payer: Self-pay | Admitting: Physician Assistant

## 2023-02-11 DIAGNOSIS — I359 Nonrheumatic aortic valve disorder, unspecified: Secondary | ICD-10-CM

## 2023-02-15 ENCOUNTER — Ambulatory Visit (HOSPITAL_COMMUNITY): Payer: 59 | Attending: Cardiology

## 2023-02-15 ENCOUNTER — Other Ambulatory Visit: Payer: Self-pay | Admitting: Physician Assistant

## 2023-02-15 DIAGNOSIS — I359 Nonrheumatic aortic valve disorder, unspecified: Secondary | ICD-10-CM | POA: Diagnosis present

## 2023-02-15 LAB — ECHOCARDIOGRAM COMPLETE
AR max vel: 1.47 cm2
AV Area VTI: 1.36 cm2
AV Area mean vel: 1.37 cm2
AV Mean grad: 30.5 mm[Hg]
AV Peak grad: 57.6 mm[Hg]
Ao pk vel: 3.8 m/s
Area-P 1/2: 3.65 cm2
S' Lateral: 3 cm

## 2023-02-16 ENCOUNTER — Inpatient Hospital Stay: Payer: 59 | Admitting: Internal Medicine

## 2023-02-16 ENCOUNTER — Telehealth: Payer: Self-pay | Admitting: Internal Medicine

## 2023-02-16 NOTE — Telephone Encounter (Signed)
I spoke with Dr. 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  Will order CBC, CMP, ESR and CRP with HD labs today.  He is getting at 6-week course of IV antibiotics given MSSE bacteremia and aortic valve thickening on TEE during hospitalization.  He missed his appointment today, recyclable not Dr. Elinor Parkinson.  After completion of IV antibiotics would recommend p.o. suppression with cefadroxil 1 g twice daily as he has a prosthetic(Bental) procedure of aortic valve.  If CRP is normal, cardiology plans on sending patient for [facility] for possible pseudoaneurysm therapy

## 2023-02-16 NOTE — Telephone Encounter (Signed)
Per Dr. Thedore Mins called [facility] to have CBC,CMP,ESR, and CRP done today. Faxed orders per nurse request to [phone]. Requested results be faxed to office.  Received confirmation fax was successful. Juanita Laster, RMA

## 2023-02-17 ENCOUNTER — Telehealth: Payer: Self-pay

## 2023-02-17 ENCOUNTER — Other Ambulatory Visit: Payer: Self-pay

## 2023-02-17 ENCOUNTER — Encounter: Payer: Self-pay | Admitting: Infectious Diseases

## 2023-02-17 ENCOUNTER — Ambulatory Visit (INDEPENDENT_AMBULATORY_CARE_PROVIDER_SITE_OTHER): Payer: 59 | Admitting: Infectious Diseases

## 2023-02-17 VITALS — BP 152/78 | HR 88 | Resp 16 | Ht 71.0 in | Wt 139.0 lb

## 2023-02-17 DIAGNOSIS — F17210 Nicotine dependence, cigarettes, uncomplicated: Secondary | ICD-10-CM | POA: Diagnosis not present

## 2023-02-17 DIAGNOSIS — I38 Endocarditis, valve unspecified: Secondary | ICD-10-CM | POA: Diagnosis not present

## 2023-02-17 DIAGNOSIS — T827XXD Infection and inflammatory reaction due to other cardiac and vascular devices, implants and grafts, subsequent encounter: Secondary | ICD-10-CM

## 2023-02-17 DIAGNOSIS — N186 End stage renal disease: Secondary | ICD-10-CM | POA: Diagnosis not present

## 2023-02-17 DIAGNOSIS — B3323 Viral pericarditis: Secondary | ICD-10-CM

## 2023-02-17 DIAGNOSIS — T826XXD Infection and inflammatory reaction due to cardiac valve prosthesis, subsequent encounter: Secondary | ICD-10-CM

## 2023-02-17 DIAGNOSIS — Z992 Dependence on renal dialysis: Secondary | ICD-10-CM

## 2023-02-17 DIAGNOSIS — Z79899 Other long term (current) drug therapy: Secondary | ICD-10-CM

## 2023-02-17 DIAGNOSIS — I729 Aneurysm of unspecified site: Secondary | ICD-10-CM | POA: Insufficient documentation

## 2023-02-17 DIAGNOSIS — IMO0001 Reserved for inherently not codable concepts without codable children: Secondary | ICD-10-CM

## 2023-02-17 DIAGNOSIS — F17200 Nicotine dependence, unspecified, uncomplicated: Secondary | ICD-10-CM

## 2023-02-17 MED ORDER — CEPHALEXIN 500 MG PO CAPS: 1000.0000 mg | ORAL_CAPSULE | Freq: Every day | ORAL | 1 refills | Status: AC

## 2023-02-17 NOTE — Telephone Encounter (Deleted)
Patient reports labs drawn at [facility] 02/16/2023. Awaiting results from [facility] if ni word by 02/18/23 noon, I will call and request agai from [facility] [phone].

## 2023-02-17 NOTE — Progress Notes (Unsigned)
Patient Active Problem List   Diagnosis Date Noted   Infectious pericarditis 01/21/2023   Sepsis (HCC) 01/18/2023   Protein-calorie malnutrition, severe 01/11/2023   Acute febrile illness 01/09/2023   SIRS (systemic inflammatory response syndrome) (HCC) 01/09/2023   Acute bronchitis 01/09/2023   Chronic heart failure with preserved ejection fraction (HFpEF) (HCC) 01/09/2023   Elevated troponin 01/09/2023   Weight loss 01/09/2023   Chronic idiopathic thrombocytopenia (HCC) 01/09/2023   Vertigo 12/19/2022   Chronic combined systolic and diastolic CHF (congestive heart failure) (HCC) 06/11/2021   Internal jugular (IJ) vein thromboembolism, acute, left (HCC) 06/10/2021   Malnutrition of moderate degree 09/06/2020   ESRD (end stage renal disease) (HCC) 09/06/2020   Cryptogenic stroke (HCC) 05/16/2020   History of loop recorder 02/13/2020   Chest pain 11/24/2019   COPD (chronic obstructive pulmonary disease) (HCC) 11/24/2019   Nonspecific chest pain    Chest pain, rule out acute myocardial infarction 09/24/2018   Chronic systolic (congestive) heart failure (HCC) 04/25/2018   Acute on chronic combined systolic and diastolic CHF (congestive heart failure) (HCC) 12/08/2017   Coronary artery disease involving native coronary artery of native heart with angina pectoris (HCC) 12/06/2017   NSTEMI (non-ST elevated myocardial infarction) (HCC) 12/02/2017   Hypertensive emergency - Accelerated Hypertension 12/02/2017   History of stroke 10/05/2017   Somnolence, daytime 04/07/2017   Snoring 04/07/2017   Cerebral infarction due to embolism of left middle cerebral artery (HCC) 06/03/2016   History of aortic dissection 06/03/2016   Palpitation    Cerebrovascular accident (CVA) due to embolism of left middle cerebral artery (HCC)    Seizures (HCC) 02/28/2016   Mixed hyperlipidemia 08/28/2015   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HCC) 06/07/2015   Cardiomyopathy (HCC) 03/01/2015   GERD  without esophagitis 03/01/2015   Essential hypertension 03/01/2015   S/P aortic dissection repair 10/03/2014   H/O aortic valve replacement 10/03/2014   CKD (chronic kidney disease), stage IV (HCC) 05/15/2014   AKI (acute kidney injury) (HCC)    Dissecting aneurysm of thoracic aorta, Stanford type B (HCC) 04/24/2014   Aortic dissection (HCC) 04/23/2014   Aneurysm of ascending aorta (HCC) 03/28/2014   Dyspnea 03/27/2014   ETOH abuse 09/20/2013   Malignant hypertension 02/20/2013   Hypertensive urgency 01/27/2012   Thrombocytopenia (HCC) 01/27/2012   Cardiomyopathy, hypertensive (HCC) 01/26/2012   AI (aortic insufficiency) 01/26/2012   MR (mitral regurgitation) 01/26/2012   Acute renal failure superimposed on stage 3 chronic kidney disease (HCC) 01/26/2012   Hypertensive cardiomyopathy, without heart failure (HCC): EF reduced to 35-40% from 55%. 06/10/2011   Chronic bronchitis (HCC) 05/23/2011   Hypertensive cardiomegaly 05/22/2011   Marijuana use 05/22/2011   Abdominal pain 05/22/2011   Hypertension, malignant 05/22/2011   Anxiety and depression 05/22/2011   Hyperlipidemia LDL goal <70 08/26/2009   Tobacco use 08/26/2009   Headache(784.0) 08/26/2009   Aortic valve disease 03/11/2009   INGUINAL HERNIA 02/18/2009   Uncontrolled hypertension 01/16/2009    Patient's Medications  New Prescriptions   No medications on file  Previous Medications   ACETAMINOPHEN (TYLENOL) 325 MG TABLET    Take 2 tablets (650 mg total) by mouth every 4 (four) hours as needed.   ALBUTEROL (VENTOLIN HFA) 108 (90 BASE) MCG/ACT INHALER    Inhale 1 puff into the lungs every 6 (six) hours as needed for wheezing or shortness of breath.   AMLODIPINE (NORVASC) 10 MG TABLET    TAKE 1 TABLET(10 MG) BY MOUTH DAILY   ASPIRIN EC 81 MG  EC TABLET    Take 1 tablet (81 mg total) by mouth daily.   CARVEDILOL (COREG) 12.5 MG TABLET    Take 1 tablet (12.5 mg total) by mouth 2 (two) times daily.   COLCHICINE 0.6 MG TABLET     Take 0.5 tablets (0.3 mg total) by mouth daily.   EVOLOCUMAB (REPATHA SURECLICK) 140 MG/ML SOAJ    Inject 140 mg into the skin every 14 (fourteen) days.   HYDRALAZINE (APRESOLINE) 100 MG TABLET    Take 0.5 tablets (50 mg total) by mouth 3 (three) times daily.   HYDROCODONE-ACETAMINOPHEN (NORCO/VICODIN) 5-325 MG TABLET    Take 1 tablet by mouth every 8 (eight) hours as needed.   LIDOCAINE-PRILOCAINE (EMLA) CREAM    Apply 1 application. topically as needed (prior to port being accessed). Use on dialysis days (Tues, Thurs and Sat)   MECLIZINE (ANTIVERT) 25 MG TABLET    Take 1 tablet (25 mg total) by mouth 3 (three) times daily as needed for dizziness.   METHOXY PEG-EPOETIN BETA (MIRCERA IJ)    Mircera   METOPROLOL TARTRATE (LOPRESSOR) 100 MG TABLET    Take one tablet TWO hours before ct scan   NITROGLYCERIN (NITROSTAT) 0.4 MG SL TABLET    Place 1 tablet (0.4 mg total) under the tongue every 5 (five) minutes as needed for chest pain.   OXYCODONE-ACETAMINOPHEN (PERCOCET) 5-325 MG TABLET    Take 1 tablet by mouth every 4 (four) hours as needed for severe pain.   PANTOPRAZOLE (PROTONIX) 40 MG TABLET    Take 1 tablet (40 mg total) by mouth daily.   POLYETHYLENE GLYCOL POWDER (GLYCOLAX/MIRALAX) 17 GM/SCOOP POWDER    Take 17 g by mouth daily.   PREDNISONE (DELTASONE) 5 MG TABLET    Take 3 tablets (15 mg total) by mouth daily with breakfast. Take 15 mg daily for 3 weeks then take 10 mg for a week then 5 mg for a week then stop   SENNA-DOCUSATE (SENOKOT-S) 8.6-50 MG TABLET    Take 2 tablets by mouth 2 (two) times daily.   STIOLTO RESPIMAT 2.5-2.5 MCG/ACT AERS    Inhale 2 puffs into the lungs at bedtime.  Modified Medications   No medications on file  Discontinued Medications   No medications on file    Subjective: 52 -year-old male with multiple co-morbidities including aortic dissection complicated with renal infarct s/p bioprosthetic Bentall and total arch replacement and staged endovascular repair of  descending aortic aneurysm in 2016, ESRD on HD, HTN, CAD s/p CABG who is here for HFU after recent hospital admission ( 11/3-11/9) for possible infectious pericarditis. He was seen by Cardiology as well as ID inpatient, work up was significant for cardiac cath 11/8 - non obstructive CAD and recommended medical management. Patient was started on colchicine and steroid taper for possible pericarditis. Discharged on 11/9 to complete 6 weeks course of abtx, EOT 02/21/23  12/4 Accompanied by his wife. Receiving IV abtx with HD and denies missing HD. Denies concerns with abtx like nausea, vomiting, diarrhea. Denies fevers and chills. Seen by Cardiology 11/18 then was seen by Cardiology Dr Lynnette Caffey 11/20. 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 TTE done 12/2 with concerns for large pseudoaneurysm/Prosthetic AV endocarditis, plan for possible AV replacement after CVTS evaluation.   He reports difficulty sleeping and has resorted to over-the-counter sleep aids. He also describes a fluctuating appetite, sometimes eating and sometimes not.  Reports prior left sided chest and back pain is improving, uses ice as well as is on steroids taper but does not feel it benefits much. He was told to stop the colchicine.   Review of Systems: all systems reviewed including CVS  and negative except as above  Past Medical History:  Diagnosis Date   Adenomatous colon polyp 08/2015   Anxiety    Aortic disease (HCC)    Aortic dissection (HCC)    a. admx 04/2014 >> L renal infarct; a/c renal failure >> b.  s/p Bioprosthetic Bentall and total arch replacement and staged endovascular repair of descending aortic aneurysm ([facility] - Dr. Kizzie Bane)   CAD (coronary artery disease)    a. LHC 4/16:  oD1 60%   Cardiomyopathy (HCC)    a. non-ischemic - probably related to untreated HTN and ETOH abuse - Echo 3/13 with EF 35-40% >> b.  Echo 4/16: Severe LVH, EF 55-60%, moderate AI, moderate MR, mild LAE, trivial effusion, known type B dissection with communication between true and false lumens with suprasternal images suggesting dissection plane may propagate to at least left subclavian takeoff, root above aortic valve okay     Chronic abdominal pain    Chronic combined systolic and diastolic congestive heart failure (HCC)    a. 05/2011: Adm with pulm edema/HTN urgency, EF 35-40% with diffuse hypokinesis and moderate to severe mitral regurgitation. Cardiomyopathy likely due to uncontrolled HTN and ETOH abuse - cath deferred due to renal insufficiency (felt due to uncontrolled HTN). bElzie Rings MV 06/2011: EF 37% and no ischemia or infarction. c. EF 45-50% by echo 01/2012.   Chronic sinusitis    DDD (degenerative disc disease), lumbar    Depression    Descending thoracic aortic aneurysm (HCC)    Dissecting aneurysm of thoracic aorta (HCC)    ESRD (end stage renal disease) (HCC)    on dialysis 1-2 days per week (2 days ordered, only coming 1 day per week as of oct 2024). on dialysis 3-4 years total   ETOH abuse    a. Reported to have quit 05/2011.   Frequent headaches    GERD (gastroesophageal reflux disease)    Headache(784.0)    "q other day" (08/08/2013)   Hemorrhoid thrombosis    History of echocardiogram    Echo 1/17:  Severe LVH, EF 55-60%, no RWMA, Gr 2 DD, AVR ok, mild to mod MR, mild LAE, mild reduced RVSF, mod RAE   History of medication noncompliance    HYPERLIPIDEMIA    Hypertension    a. Hx of HTN urgency secondary to noncompliance. b. urinary metanephrine and catecholeamine levels normal 2013.  c. Renal art [name] 1/16:  No evidence of renal artery stenosis noted bilaterally.   INGUINAL HERNIA    Pneumonia ~ 2013   Serrated adenoma of colon 08/2015   Stroke [facility])    Tobacco abuse    Valvular heart disease    a. Echo 05/2011: moderate to severe eccentric MR and mild to moderate AI with prolapsing left coronary cusp.  b. Echo 01/2012: mild-mod AI, mild dilitation of aortic root, mild MR.;  c. Echo 1/16: Severe LVH consistent with hypertrophic cardio myopathy, EF 50%, no RWMA, mod AI, mild MR, mild RAE, dilated Ao root (40 mm);    Past Surgical History:  Procedure Laterality Date   A/V FISTULAGRAM Left 03/07/2021   Procedure: A/V FISTULAGRAM;  Surgeon: Chuck Hint, MD;  Location: [facility] INVASIVE CV LAB;  Service: Cardiovascular;  Laterality: Left;   A/V  FISTULAGRAM Left 07/18/2021   Procedure: A/V Fistulagram;  Surgeon: Chuck Hint, MD;  Location: [facility] INVASIVE CV LAB;  Service: Cardiovascular;  Laterality: Left;   A/V FISTULAGRAM Left 12/25/2022   Procedure: A/V Fistulagram;  Surgeon: Leonie Douglas, MD;  Location: [facility] INVASIVE CV LAB;  Service: Cardiovascular;  Laterality: Left;   ANKLE SURGERY Bilateral    Fractures bilaterally   AORTIC VALVE SURGERY  09/2014   AV FISTULA PLACEMENT Left 09/09/2020   Procedure: LEFT ARM ARTERIOVENOUS (AV) FISTULA CREATION;  Surgeon: Leonie Douglas, MD;  Location: [facility] OR;  Service: Vascular;  Laterality: Left;   BASCILIC VEIN TRANSPOSITION Left 10/09/2020   Procedure: LEFT SECOND STAGE BASILIC VEIN TRANSPOSITION;  Surgeon: Leonie Douglas, MD;  Location: [facility] OR;  Service: Vascular;  Laterality: Left;  PERIPHERAL NERVE BLOCK   CORONARY ANGIOGRAPHY N/A 12/06/2017   Procedure: CORONARY ANGIOGRAPHY;  Surgeon: Lyn Records, MD;  Location: [facility] INVASIVE CV LAB;  Service: Cardiovascular;  Laterality: N/A;   CORONARY ANGIOGRAPHY N/A 09/26/2018   Procedure: CORONARY ANGIOGRAPHY;  Surgeon: Yvonne Kendall, MD;  Location: [facility] INVASIVE CV LAB;  Service: Cardiovascular;  Laterality: N/A;   CORONARY STENT INTERVENTION N/A 12/10/2017   Procedure: CORONARY STENT INTERVENTION;  Surgeon: Lennette Bihari, MD;  Location: [facility] INVASIVE CV LAB;  Service: Cardiovascular;  Laterality: N/A;   FOOT FRACTURE SURGERY Bilateral 2004-2010   "got pins in both of them"   HEMORRHOID SURGERY N/A  06/15/2015   Procedure: HEMORRHOIDECTOMY;  Surgeon: Almond Lint, MD;  Location: [facility] OR;  Service: General;  Laterality: N/A;   INGUINAL HERNIA REPAIR Right ~ 1996   INSERTION OF DIALYSIS CATHETER N/A 09/09/2020   Procedure: INSERTION OF TUNNELED DIALYSIS PALINDROME 19cm CATHETER;  Surgeon: Leonie Douglas, MD;  Location: [facility] OR;  Service: Vascular;  Laterality: N/A;   LEFT HEART CATH AND CORONARY ANGIOGRAPHY N/A 02/05/2021   Procedure: LEFT HEART CATH AND CORONARY ANGIOGRAPHY;  Surgeon: Laurey Morale, MD;  Location: [facility] INVASIVE CV LAB;  Service: Cardiovascular;  Laterality: N/A;   LEFT HEART CATH AND CORONARY ANGIOGRAPHY N/A 01/22/2023   Procedure: LEFT HEART CATH AND CORONARY ANGIOGRAPHY;  Surgeon: [place], Peter M, MD;  Location: [facility] INVASIVE CV LAB;  Service: Cardiovascular;  Laterality: N/A;   LEFT HEART CATHETERIZATION WITH CORONARY ANGIOGRAM N/A 06/21/2014   Procedure: LEFT HEART CATHETERIZATION WITH CORONARY ANGIOGRAM;  Surgeon: Laurey Morale, MD;  Location: [facility] CATH LAB;  Service: Cardiovascular;  Laterality: N/A;   LOOP RECORDER INSERTION N/A 06/19/2016   Procedure: Loop Recorder Insertion;  Surgeon: [facility] Salvia, MD;  Location: [facility] INVASIVE CV LAB;  Service: Cardiovascular;  Laterality: N/A;   PERIPHERAL VASCULAR BALLOON ANGIOPLASTY  03/07/2021   Procedure: PERIPHERAL VASCULAR BALLOON ANGIOPLASTY;  Surgeon: Chuck Hint, MD;  Location: [facility] INVASIVE CV LAB;  Service: Cardiovascular;;   PERIPHERAL VASCULAR BALLOON ANGIOPLASTY  07/18/2021   Procedure: PERIPHERAL VASCULAR BALLOON ANGIOPLASTY;  Surgeon: Chuck Hint, MD;  Location: [facility] INVASIVE CV LAB;  Service: Cardiovascular;;   PERIPHERAL VASCULAR INTERVENTION Left 12/25/2022   Procedure: PERIPHERAL VASCULAR INTERVENTION;  Surgeon: Leonie Douglas, MD;  Location: [facility] INVASIVE CV LAB;  Service: Cardiovascular;  Laterality: Left;  stent to brachiocephalic vein   PERIPHERAL VASCULAR ULTRASOUND/IVUS Left 12/25/2022   Procedure:  Peripheral Vascular Ultrasound/IVUS;  Surgeon: Leonie Douglas, MD;  Location: [facility] INVASIVE CV LAB;  Service: Cardiovascular;  Laterality: Left;   TEE WITHOUT CARDIOVERSION N/A 03/12/2016   Procedure: TRANSESOPHAGEAL ECHOCARDIOGRAM (TEE);  Surgeon: Thurmon Fair, MD;  Location:  [facility] ENDOSCOPY;  Service: Cardiovascular;  Laterality: N/A;   TEE WITHOUT CARDIOVERSION N/A 10/16/2020   Procedure: TRANSESOPHAGEAL ECHOCARDIOGRAM (TEE);  Surgeon: Laurey Morale, MD;  Location: [facility] ENDOSCOPY;  Service: Cardiovascular;  Laterality: N/A;   TRANSESOPHAGEAL ECHOCARDIOGRAM (CATH LAB) N/A 01/13/2023   Procedure: TRANSESOPHAGEAL ECHOCARDIOGRAM;  Surgeon: Quintella Reichert, MD;  Location: [facility] INVASIVE CV LAB;  Service: Cardiovascular;  Laterality: N/A;   UPPER EXTREMITY VENOGRAPHY Right 07/18/2021   Procedure: UPPER EXTREMITY VENOGRAPHY;  Surgeon: Chuck Hint, MD;  Location: [facility] INVASIVE CV LAB;  Service: Cardiovascular;  Laterality: Right;    Social History   Tobacco Use   Smoking status: Some Days    Current packs/day: 0.25    Average packs/day: 0.3 packs/day for 23.0 years (5.8 ttl pk-yrs)    Types: Cigarettes    Passive exposure: Current   Smokeless tobacco: Never   Tobacco comments:    3 to 4 per day  Substance Use Topics   Alcohol use: Not Currently    Alcohol/week: 1.0 standard drink of alcohol    Types: 1 Cans of beer per week    Comment: On special occasion   Drug use: Yes    Types: Marijuana    Comment: 2 times/week - last use 10/06/20    Family History  Problem Relation Age of Onset   Hypertension Mother    Hypertension Other    Colon cancer Paternal Uncle    Stroke Maternal Aunt    Heart attack Brother    Hypertension Brother    Diabetes Maternal Aunt    Lung cancer Maternal Uncle    Stroke Maternal Uncle    Other Sister        "breathing machine at night"   Headache Sister    Thyroid disease Sister    Throat cancer Neg Hx    Pancreatic cancer Neg Hx    Esophageal cancer  Neg Hx    Kidney disease Neg Hx    Liver disease Neg Hx     Allergies  Allergen Reactions   Clonidine Derivatives Nausea And Vomiting and Other (See Comments)    Tablets by mouth = Tolerated  Patches on the skin = Nausea & vomiting after wearing 4-5 days    Imdur [Isosorbide Nitrate] Other (See Comments)    Headaches -- patient instructed to continue taking    Tramadol Other (See Comments)    Headaches    Clonidine Nausea And Vomiting    Health Maintenance  Topic Date Due   Hepatitis C Screening  Never done   Medicare Annual Wellness (AWV)  06/18/2015   Zoster Vaccines- Shingrix (1 of 2) Never done   INFLUENZA VACCINE  Never done   COVID-19 Vaccine (1 - 2023-24 season) Never done   Colonoscopy  02/16/2023   DTaP/Tdap/Td (2 - Td or Tdap) 10/03/2028   HIV Screening  Completed   HPV VACCINES  Aged Out    Objective: BP (!) 152/78   Pulse 88   Resp 16   Ht 5\' 11"  (1.803 m)   Wt 139 lb (63 kg)   SpO2 98%   BMI 19.39 kg/m    Physical Exam Constitutional:      Appearance: Normal appearance.  HENT:     Head: Normocephalic and atraumatic.      Mouth: Mucous membranes are moist.  Eyes:    Conjunctiva/sclera: Conjunctivae normal.     Pupils: Pupils are equal, round, and bilaterally symmetrical   Cardiovascular:     Rate and Rhythm:  Normal rate and regular rhythm.     Heart sounds: loud systolic murmur+  Pulmonary:     Effort: Pulmonary effort is normal.     Breath sounds:   Abdominal:     General: Non distended     Palpations: soft.   Musculoskeletal:        General: Normal range of motion.   Skin:    General: Skin is warm and dry.     Comments: left arm AVF Ok with thrill, no concerns  Neurological:     General: grossly non focal     Mental Status: awake, alert and oriented to person, place, and time.   Psychiatric:        Mood and Affect: Mood normal.   Lab Results Lab Results  Component Value Date   WBC 13.0 (H) 01/23/2023   HGB 8.0 (L)  01/23/2023   HCT 25.1 (L) 01/23/2023   MCV 98.0 01/23/2023   PLT 145 (L) 01/23/2023    Lab Results  Component Value Date   CREATININE 7.85 (H) 01/23/2023   BUN 61 (H) 01/23/2023   NA 129 (L) 01/23/2023   K 4.3 01/23/2023   CL 93 (L) 01/23/2023   CO2 23 01/23/2023    Lab Results  Component Value Date   ALT 5 01/19/2023   AST 70 (H) 01/19/2023   ALKPHOS 59 01/19/2023   BILITOT 0.8 01/19/2023    Lab Results  Component Value Date   CHOL 153 12/20/2022   HDL 47 12/20/2022   LDLCALC 97 12/20/2022   TRIG 47 12/20/2022   CHOLHDL 3.3 12/20/2022   Lab Results  Component Value Date   LABRPR NON REAC 05/19/2007   No results found for: "HIV1RNAQUANT", "HIV1RNAVL", "CD4TABS"   Imaging ECHOCARDIOGRAM COMPLETE  Result Date: 02/15/2023    ECHOCARDIOGRAM REPORT   Patient Name:   [name] Date of Exam: 02/15/2023 Medical Rec #:  [id number]     Height:       71.0 in Accession #:    [id number]    Weight:       137.2 lb Date of Birth:  November 18, 1970      BSA:          1.796 m Patient Age:    52 years      BP:           148/80 mmHg Patient Gender: M             HR:           80 bpm. Exam Location:  [address] Procedure: 2D Echo, Cardiac Doppler and Color Doppler Indications:     Z95.2 AVR  History:         Patient has prior history of Echocardiogram examinations, most                  recent 01/10/2023. LVH and Cardiomyopathy, Previous Myocardial                  Infarction, COPD, Stroke and Aortic dissection repair, S/p AVR                  (27mm Mitroflow pericardial valve); Risk Factors:Alcohol abuse,                  Hypertension, Dyslipidemia and Current Smoker.  Sonographer:     Samule Ohm RDCS Referring Phys:  [id number] [facility] R THOMPSON Diagnosing Phys: Clearnce Hasten IMPRESSIONS  1. Severe biventricular hypertrophy, reasonable to rule out amyloid.  Left ventricular ejection fraction, by estimation, is 55 to 60%. The left ventricle has normal function. The left ventricle has no regional  wall motion abnormalities. There is severe left ventricular hypertrophy. Left ventricular diastolic parameters are consistent with Grade I diastolic dysfunction (impaired relaxation).  2. Right ventricular systolic function is mildly reduced. The right ventricular size is normal. Moderately increased right ventricular wall thickness.  3. Left atrial size was severely dilated.  4. Right atrial size was severely dilated.  5. The mitral valve is normal in structure. Mild mitral valve regurgitation.  6. 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 There does not appear to be any significant paravalvular leak or leaflet involvement. As noted previously, significant prosthetic valve degeneration and stenosis. The aortic valve has been repaired/replaced. There is mild calcification of the aortic valve. There is moderate thickening of the aortic valve. Aortic valve regurgitation is not visualized. Echo findings are consistent with stenosis of the aortic prosthesis. Aortic valve area, by VTI measures 1.36 cm. Aortic valve mean gradient measures 30.5 mmHg. Aortic valve Vmax measures 3.80 m/s.  7. Patient with previous aortic Bentall procedure in 2016. There appears to be a large pseudoaneursym primarily anterior to the aortic valve best seen adjacent to the right coronary cusp but extending circumferentially. Aortic dilatation noted.  8. Moderately dilated pulmonary artery.  9. The inferior vena cava is normal in size with greater than 50% respiratory variability, suggesting right atrial pressure of 3 mmHg. Conclusion(s)/Recommendation(s): Discussed findings of LVOT pseudoaneurysm with TAVR team. FINDINGS  Left Ventricle: Severe biventricular hypertrophy, reasonable to rule out amyloid. Left ventricular ejection fraction, by estimation, is 55 to 60%. The left ventricle has normal function. The left ventricle has no regional wall motion abnormalities. The left ventricular internal  cavity size was normal in size. There is severe left ventricular hypertrophy. Abnormal (paradoxical) septal motion consistent with post-operative status. Left ventricular diastolic parameters are consistent with Grade I diastolic dysfunction (impaired relaxation). Right Ventricle: The right ventricular size is normal. Moderately increased right ventricular wall thickness. Right ventricular systolic function is mildly reduced. Left Atrium: Left atrial size was severely dilated. Right Atrium: Right atrial size was severely dilated. Pericardium: Trivial pericardial effusion is present. Mitral Valve: The mitral valve is normal in structure. Mild mitral valve regurgitation. Tricuspid Valve: The tricuspid valve is normal in structure. Tricuspid valve regurgitation is mild. 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 There does not appear to be any significant paravalvular leak or leaflet involvement. As noted previously, significant prosthetic valve degeneration and stenosis. The aortic valve has been repaired/replaced. There is mild calcification of the aortic valve. There is moderate thickening of the aortic valve. Aortic valve regurgitation is not visualized. Aortic valve mean gradient measures 30.5 mmHg. Aortic valve peak gradient measures 57.6 mmHg. Aortic valve area, by VTI measures 1.36 cm. There is a 27 mm Mitroflow pericardial valve pericardial valve present in the aortic position. Pulmonic Valve: The pulmonic valve was normal in structure. Pulmonic valve regurgitation is mild. Aorta: Patient with previous aortic Bentall procedure in 2016. There appears to be a large pseudoaneursym primarily anterior to the aortic valve best seen adjacent to the right coronary cusp but extending circumferentially. Aortic dilatation noted. Pulmonary Artery: The pulmonary artery is moderately dilated. Venous: The inferior vena cava is normal in size with greater than 50%  respiratory variability, suggesting right atrial pressure of 3 mmHg. IAS/Shunts: No atrial level shunt detected by color  flow Doppler.  LEFT VENTRICLE PLAX 2D LVIDd:         4.60 cm   Diastology LVIDs:         3.00 cm   LV e' medial:    5.33 cm/s LV PW:         1.50 cm   LV E/e' medial:  24.0 LV IVS:        1.80 cm   LV e' lateral:   10.60 cm/s LVOT diam:     2.40 cm   LV E/e' lateral: 12.1 LV SV:         104 LV SV Index:   58 LVOT Area:     4.52 cm  RIGHT VENTRICLE            IVC RV S prime:     9.25 cm/s  IVC diam: 1.10 cm TAPSE (M-mode): 1.6 cm RVSP:           28.0 mmHg LEFT ATRIUM           Index        RIGHT ATRIUM           Index LA diam:      4.30 cm 2.39 cm/m   RA Pressure: 3.00 mmHg LA Vol (A2C): 74.5 ml 41.47 ml/m  RA Area:     27.30 cm LA Vol (A4C): 96.8 ml 53.88 ml/m  RA Volume:   101.00 ml 56.22 ml/m  AORTIC VALVE AV Area (Vmax):    1.47 cm AV Area (Vmean):   1.37 cm AV Area (VTI):     1.36 cm AV Vmax:           379.50 cm/s AV Vmean:          255.500 cm/s AV VTI:            0.761 m AV Peak Grad:      57.6 mmHg AV Mean Grad:      30.5 mmHg LVOT Vmax:         123.00 cm/s LVOT Vmean:        77.400 cm/s LVOT VTI:          0.229 m LVOT/AV VTI ratio: 0.30  AORTA Ao Root diam: 4.80 cm Ao Asc diam:  4.20 cm MITRAL VALVE                TRICUSPID VALVE MV Area (PHT): 3.65 cm     TR Peak grad:   25.0 mmHg MV Decel Time: 208 msec     TR Vmax:        250.00 cm/s MV E velocity: 128.00 cm/s  Estimated RAP:  3.00 mmHg MV A velocity: 105.00 cm/s  RVSP:           28.0 mmHg MV E/A ratio:  1.22                             SHUNTS                             Systemic VTI:  0.23 m                             Systemic Diam: 2.40 cm Clearnce Hasten Electronically signed by Clearnce Hasten Signature Date/Time: 02/15/2023/1:20:05 PM    Final (Updated)    CT ANGIO CHEST AORTA W/CM & OR  WO/CM  Result Date: 02/14/2023 CLINICAL DATA:  Preoperative examination prior to TAVR. EXAM: CT ANGIOGRAPHY CHEST, ABDOMEN AND PELVIS  TECHNIQUE: Non-contrast CT of the chest was initially obtained. Multidetector CT imaging through the chest, abdomen and pelvis was performed using the standard protocol during bolus administration of intravenous contrast. Multiplanar reconstructed images and MIPs were obtained and reviewed to evaluate the vascular anatomy. RADIATION DOSE REDUCTION: This exam was performed according to the departmental dose-optimization program which includes automated exposure control, adjustment of the mA and/or kV according to patient size and/or use of iterative reconstruction technique. CONTRAST:  95mL OMNIPAQUE IOHEXOL 350 MG/ML SOLN COMPARISON:  CT of the chest, abdomen pelvis-01/16/2023; 01/09/2023; 04/27/2022; 11/24/2019; 12/02/2017 FINDINGS: CTA CHEST FINDINGS Cardiovascular: Stable sequela of open aortic valve repair and repair of the aortic root with debranching elephant trunk procedure and overlapping stent graft repair of the remainder of the ascending thoracic aorta, aortic arch and descending thoracic aorta. The deep branching elephant trunk bypass grafts appears widely patent. The thoracic aortic stent grafts appear widely patent without evidence of in stent stenosis or an endoleak as the native thoracic aorta appears well apposed against the stent graft. Marked cardiomegaly. Coronary artery calcifications. No pericardial effusion. Although this examination was not tailored for the evaluation the pulmonary arteries, there are no discrete filling defects within the central pulmonary arterial tree to suggest central pulmonary embolism. Enlarged caliber of the main pulmonary artery measuring 4.4 cm in diameter (image 82, series 6), unchanged. Redemonstrated stenting of the left innominate vein which appears widely patent. Mediastinum/Nodes: Scattered mediastinal lymph nodes are numerous though individually not enlarged by size criteria with index precarinal node measuring 0.8 cm in greatest short axis diameter,  presumably reactive given postoperative state. No worsening bulky mediastinal, hilar or axillary lymphadenopathy. Lungs/Pleura: Redemonstrated minimal dependent subpleural ground-glass atelectasis, left-greater-than-right. No discrete focal airspace opacities. No pleural effusion or pneumothorax. The central pulmonary airways appear widely patent. No discrete pulmonary nodules. Musculoskeletal: No acute or aggressive osseous abnormalities. Regional soft tissues appear normal. Normal appearance thyroid gland. Review of the MIP images confirms the above findings. _________________________________________________________ CTA ABDOMEN AND PELVIS FINDINGS VASCULAR Aorta: The patient has undergone open repair of the perirenal abdominal aorta with surgical bypass grafting of the celiac, SMA and bilateral renal arteries all of which appear similar to prior examinations and without a hemodynamically significant narrowing. The presumed excluded portion of the native abdominal aorta is unchanged measuring 3.3 cm in diameter (image 159, series 6), similar to at least the 2019 examination. 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 The bilateral internal iliac arteries are diseased though patent and of normal caliber. The bilateral external iliac arteries are tortuous though of normal caliber and without hemodynamically significant narrowing. Veins: The IVC and pelvic venous systems appear patent on this arterial phase examination. Review of the MIP images confirms the above findings.  _________________________________________________________ NON-VASCULAR Evaluation of the abdominal organs is limited to the arterial phase of enhancement. Hepatobiliary: Normal hepatic contour. Subcentimeter hypoattenuating lesion with the dome of the right lobe of the liver is too small to accurately characterize though favored to represent hepatic  cyst. No discrete hyperenhancing lesions. There is a approximate 0.5 cm radiopaque gallstone within otherwise normal-appearing gallbladder. No gallbladder wall thickening or pericholecystic stranding. No intra extrahepatic biliary ductal dilatation. No ascites. Pancreas: Normal appearance of the pancreas. Spleen: Normal appearance of the spleen. Adrenals/Urinary Tract: There is slightly delayed enhancement of the left kidney comparison right. The left kidney is slightly atrophic comparison to the right. No evidence of obstruction. No evidence of nephrolithiasis postcontrast examination. Normal appearance of the bilateral adrenal glands. The urinary bladder is underdistended. Stomach/Bowel: Moderate colonic stool burden without evidence of enteric obstruction. Discrete areas of bowel wall thickening, though evaluation degraded secondary to lack of mesenteric fat. No pneumoperitoneum, pneumatosis or portal venous gas. Lymphatic: No definitive bulky retroperitoneal, mesenteric, pelvic or inguinal lymphadenopathy on this arterial phase examination. Reproductive: Normal appearance of the prostate gland. No free fluid the pelvic cul-de-sac. Other: Regional soft tissues appear normal. Musculoskeletal: No acute or aggressive osseous abnormalities. There is mild diffuse increased sclerosis of the osseous structures as could be seen in the setting of renal osteodystrophy. Review of the MIP images confirms the above findings. IMPRESSION: Vascular Impression: 1. Stable sequela of open aortic valve repair and repair of the aortic root with debranching elephant trunk procedure and  overlapping stent graft repair of the remainder of the ascending thoracic aorta, aortic arch and descending thoracic aorta. 2. Stable sequela of open repair of the perirenal abdominal aorta with surgical bypass grafting of the celiac, SMA and bilateral renal arteries all of which appear similar to prior examinations and without a hemodynamically significant narrowing. 3. 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 Aortic aneurysm NOS (ICD10-I71.9). 4. 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 5. Post stenting of the left innominate vein which appears widely patent. 6. Similar findings of cardiomegaly with enlargement of the caliber of the main artery as we seen in the setting pulmonary arterial hypertension. 7. Aortic Atherosclerosis (ICD10-I70.0). Nonvascular Impression: 1. Cholelithiasis without evidence of acute cholecystitis. Electronically Signed   By: Simonne Come M.D.   On: 02/14/2023 14:36   CT ANGIO ABDOMEN PELVIS  W & WO CONTRAST  Result Date: 02/14/2023 CLINICAL DATA:  Preoperative examination prior to TAVR. EXAM: CT ANGIOGRAPHY CHEST, ABDOMEN AND PELVIS TECHNIQUE: Non-contrast CT of the chest was initially obtained. Multidetector CT imaging through the chest, abdomen and pelvis was performed using the standard protocol during bolus administration of intravenous contrast. Multiplanar reconstructed images and MIPs were obtained and reviewed to evaluate the vascular anatomy. RADIATION DOSE REDUCTION: This exam was performed according to the departmental dose-optimization program which includes automated exposure control, adjustment of the mA and/or kV according to patient size and/or use of iterative  reconstruction technique. CONTRAST:  95mL OMNIPAQUE IOHEXOL 350 MG/ML SOLN COMPARISON:  CT of the chest, abdomen pelvis-01/16/2023; 01/09/2023; 04/27/2022; 11/24/2019; 12/02/2017 FINDINGS: CTA CHEST FINDINGS Cardiovascular: Stable sequela of open aortic valve repair and repair of the aortic root with debranching elephant trunk procedure and overlapping stent graft repair of the remainder of the ascending thoracic aorta, aortic arch and descending thoracic aorta. The deep branching elephant trunk bypass grafts appears widely patent. The thoracic aortic stent grafts appear widely patent without evidence of in stent stenosis or an endoleak as the native thoracic aorta appears well apposed against  the stent graft. Marked cardiomegaly. Coronary artery calcifications. No pericardial effusion. Although this examination was not tailored for the evaluation the pulmonary arteries, there are no discrete filling defects within the central pulmonary arterial tree to suggest central pulmonary embolism. Enlarged caliber of the main pulmonary artery measuring 4.4 cm in diameter (image 82, series 6), unchanged. Redemonstrated stenting of the left innominate vein which appears widely patent. Mediastinum/Nodes: Scattered mediastinal lymph nodes are numerous though individually not enlarged by size criteria with index precarinal node measuring 0.8 cm in greatest short axis diameter, presumably reactive given postoperative state. No worsening bulky mediastinal, hilar or axillary lymphadenopathy. Lungs/Pleura: Redemonstrated minimal dependent subpleural ground-glass atelectasis, left-greater-than-right. No discrete focal airspace opacities. No pleural effusion or pneumothorax. The central pulmonary airways appear widely patent. No discrete pulmonary nodules. Musculoskeletal: No acute or aggressive osseous abnormalities. Regional soft tissues appear normal. Normal appearance thyroid gland. Review of the MIP images confirms the above  findings. _________________________________________________________ CTA ABDOMEN AND PELVIS FINDINGS VASCULAR Aorta: The patient has undergone open repair of the perirenal abdominal aorta with surgical bypass grafting of the celiac, SMA and bilateral renal arteries all of which appear similar to prior examinations and without a hemodynamically significant narrowing. The presumed excluded portion of the native abdominal aorta is unchanged measuring 3.3 cm in diameter (image 159, series 6), similar to at least the 2019 examination. 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 The bilateral internal iliac arteries are diseased though patent and of normal caliber. The bilateral external iliac arteries are tortuous though of normal caliber and without hemodynamically significant narrowing. Veins: The IVC and pelvic venous systems appear patent on this arterial phase examination. Review of the MIP images confirms the above findings. _________________________________________________________ NON-VASCULAR Evaluation of the abdominal organs is limited to the arterial phase of enhancement. Hepatobiliary: Normal hepatic contour. Subcentimeter hypoattenuating lesion with the dome of the right lobe of the liver is too small to accurately characterize though favored to represent hepatic cyst. No discrete hyperenhancing lesions. There is a approximate 0.5 cm radiopaque gallstone within otherwise normal-appearing gallbladder. No gallbladder wall thickening or pericholecystic stranding. No intra  extrahepatic biliary ductal dilatation. No ascites. Pancreas: Normal appearance of the pancreas. Spleen: Normal appearance of the spleen. Adrenals/Urinary Tract: There is slightly delayed enhancement of the left kidney comparison right. The left kidney is slightly atrophic comparison to the right. No evidence of obstruction. No evidence of nephrolithiasis postcontrast examination. Normal appearance of the bilateral adrenal glands. The urinary bladder is underdistended. Stomach/Bowel: Moderate colonic stool burden without evidence of enteric obstruction. Discrete areas of bowel wall thickening, though evaluation degraded secondary to lack of mesenteric fat. No pneumoperitoneum, pneumatosis or portal venous gas. Lymphatic: No definitive bulky retroperitoneal, mesenteric, pelvic or inguinal lymphadenopathy on this arterial phase examination. Reproductive: Normal appearance of the prostate gland. No free fluid the pelvic cul-de-sac. Other: Regional soft tissues appear normal. Musculoskeletal: No acute or aggressive osseous abnormalities. There is mild diffuse increased sclerosis of the osseous structures as could be seen in the setting of renal osteodystrophy. Review of the MIP images confirms the above findings. IMPRESSION: Vascular Impression: 1. Stable sequela  of open aortic valve repair and repair of the aortic root with debranching elephant trunk procedure and overlapping stent graft repair of the remainder of the ascending thoracic aorta, aortic arch and descending thoracic aorta. 2. Stable sequela of open repair of the perirenal abdominal aorta with surgical bypass grafting of the celiac, SMA and bilateral renal arteries all of which appear similar to prior examinations and without a hemodynamically significant narrowing. 3. 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 Aortic aneurysm NOS (ICD10-I71.9). 4. 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 5. Post stenting of the left innominate vein which appears widely patent. 6. Similar findings of cardiomegaly with enlargement of the caliber of the main artery as we seen in the setting pulmonary arterial hypertension. 7. Aortic Atherosclerosis (ICD10-I70.0). Nonvascular Impression: 1. Cholelithiasis without evidence of acute cholecystitis. Electronically Signed   By: Simonne Come M.D.   On: 02/14/2023 14:36   CT CORONARY MORPH W/CTA COR W/SCORE W/CA W/CM &/OR WO/CM  Result Date: 02/10/2023 CLINICAL DATA:  Aortic valve replacement evaluation EXAM: Cardiac valve CT TECHNIQUE: The patient was scanned on a Siemens Force 192 slice scanner. A 120 kV retrospective scan was triggered in the descending thoracic aorta at 111 HU's. Gantry rotation speed was 270 msecs and collimation was .9 mm. The 3D data set was reconstructed in 5% intervals of the R-R cycle. Systolic and diastolic phases were analyzed on a dedicated work station using MPR, MIP and VRT modes. The patient received 95mL OMNIPAQUE IOHEXOL 350 MG/ML SOLN of contrast. 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 Prosthetic aortic valve leaflets appear severely thickened and mildly calcified with hypoattenuation at the commissures. The leaflets adjacent to the native left and noncoronary cusps demonstrate restricted motion. Valve findings suggests degenerative prosthetic valve changes. 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 Measurements are approximately 4.3 x 2.8 x 3.4  cm Internal dimensions of valve ring are 26.4 x 26 mm. Due to pseudoaneurysm, valve may not be suitable for transcatheter valve therapies. Coronary arteries are reimplanted and are 18 mm (L) and 20.5 mm (R) above the valve. They are reimplanted above the length of the leaflets. 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 This appears grossly normal however will be fully evaluated in radiologist interpretation. Atria: Large left atrial appendage with no apparent thrombus. Biatrial dilation. Other: Severely dilated main pulmonary artery. Grossly normal pulmonary vein anatomy. Grossly normal mitral valve with mild thickening. Severe LV wall thickening. IMPRESSION: 1. 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 Measurements are approximately 4.3 x 2.8 x 3.4 cm. The is systolic expansion of the pseudoaneurysm with probable mild compression of the valved conduit in systole though this evaluation is somewhat limited and may be better visualized on echo. Compared to contrast chest CT 01/16/23, this finding appears new. 2. Prosthetic aortic valve leaflets appear severely thickened and mildly calcified with hypoattenuation at the commissures. The leaflets adjacent to  the native left and noncoronary cusps demonstrate restricted motion. Valve findings suggests degenerative prosthetic valve changes. Due to pseudoaneurysm location, valve may not be suitable for transcatheter valve therapies. Electronically Signed   By: Weston Brass M.D.   On: 02/10/2023 21:57   CARDIAC CATHETERIZATION  Result Date: 01/22/2023   Prox LAD to Mid LAD lesion is 30% stenosed.   Mid LAD lesion is 40% stenosed.   Prox Cx lesion is 40% stenosed.   Mid Cx lesion is 40% stenosed.   Prox RCA lesion is 40% stenosed.   1st Diag lesion is 95% stenosed.   Non-stenotic Ost  Ramus lesion was previously treated. Nonobstructive CAD except for chronically occluded small diagonal. Patent stent in the Ramus intermediate. Markedly aneurysmal coronary arteries. No change in angiography compared to prior study. Continue medical management.   NM Myocar Multi W/Spect W/Wall Motion / EF  Result Date: 01/20/2023   Findings are consistent with infarction. The study is intermediate risk.   ST depression was noted.  Downsloping ST depression noted in leads V5-V6 (worse than baseline). Very mild horizontal depression also noted in lead II.   LV perfusion is abnormal. There is no evidence of ischemia. There is evidence of infarction. Defect 1: There is a medium defect with moderate reduction in uptake present in the mid to basal inferolateral location(s) that is fixed. Consistent with infarction.   MR THORACIC SPINE WO CONTRAST  Result Date: 01/20/2023 CLINICAL DATA:  Bacteremia and pain EXAM: MRI CERVICAL AND THORACIC SPINE WITHOUT CONTRAST TECHNIQUE: Multiplanar and multiecho pulse sequences of the cervical spine, to include the craniocervical junction and cervicothoracic junction, and the thoracic spine, were obtained without intravenous contrast. COMPARISON:  None Available. FINDINGS: MRI CERVICAL SPINE FINDINGS Alignment: Reversal of normal cervical lordosis. No static subluxation. Vertebrae: No fracture, evidence of discitis, or bone lesion. Cord: Normal signal and morphology. Posterior Fossa, vertebral arteries, paraspinal tissues: Negative. Disc levels: C1-2: Unremarkable. C2-3: Small disc bulge. There is no spinal canal stenosis. No neural foraminal stenosis. C3-4: Small disc bulge. There is no spinal canal stenosis. No neural foraminal stenosis. C4-5: Normal disc space and facet joints. There is no spinal canal stenosis. No neural foraminal stenosis. C5-6: Disc desiccation without herniation. There is no spinal canal stenosis. No neural foraminal stenosis. C6-7: Disc desiccation and  small left subarticular protrusion. There is no spinal canal stenosis. No neural foraminal stenosis. C7-T1: Normal disc space and facet joints. There is no spinal canal stenosis. No neural foraminal stenosis. MRI THORACIC SPINE FINDINGS Alignment:  Physiologic. Vertebrae: No fracture, evidence of discitis, or bone lesion. Cord:  Normal signal and morphology. Paraspinal and other soft tissues: Thoracic aortic stent Disc levels: No spinal canal stenosis or nerve root impingement. IMPRESSION: 1. No acute abnormality of the cervical or thoracic spine. 2. Mild cervical degenerative disc disease without spinal canal or neural foraminal stenosis. Electronically Signed   By: Deatra Robinson M.D.   On: 01/20/2023 02:08   MR CERVICAL SPINE WO CONTRAST  Result Date: 01/20/2023 CLINICAL DATA:  Bacteremia and pain EXAM: MRI CERVICAL AND THORACIC SPINE WITHOUT CONTRAST TECHNIQUE: Multiplanar and multiecho pulse sequences of the cervical spine, to include the craniocervical junction and cervicothoracic junction, and the thoracic spine, were obtained without intravenous contrast. COMPARISON:  None Available. FINDINGS: MRI CERVICAL SPINE FINDINGS Alignment: Reversal of normal cervical lordosis. No static subluxation. Vertebrae: No fracture, evidence of discitis, or bone lesion. Cord: Normal signal and morphology. Posterior Fossa, vertebral arteries, paraspinal tissues: Negative. Disc levels: C1-2: Unremarkable.  C2-3: Small disc bulge. There is no spinal canal stenosis. No neural foraminal stenosis. C3-4: Small disc bulge. There is no spinal canal stenosis. No neural foraminal stenosis. C4-5: Normal disc space and facet joints. There is no spinal canal stenosis. No neural foraminal stenosis. C5-6: Disc desiccation without herniation. There is no spinal canal stenosis. No neural foraminal stenosis. C6-7: Disc desiccation and small left subarticular protrusion. There is no spinal canal stenosis. No neural foraminal stenosis. C7-T1:  Normal disc space and facet joints. There is no spinal canal stenosis. No neural foraminal stenosis. MRI THORACIC SPINE FINDINGS Alignment:  Physiologic. Vertebrae: No fracture, evidence of discitis, or bone lesion. Cord:  Normal signal and morphology. Paraspinal and other soft tissues: Thoracic aortic stent Disc levels: No spinal canal stenosis or nerve root impingement. IMPRESSION: 1. No acute abnormality of the cervical or thoracic spine. 2. Mild cervical degenerative disc disease without spinal canal or neural foraminal stenosis. Electronically Signed   By: Deatra Robinson M.D.   On: 01/20/2023 02:08   DG CHEST PORT 1 VIEW  Result Date: 01/18/2023 CLINICAL DATA:  Shortness of breath. EXAM: PORTABLE CHEST 1 VIEW COMPARISON:  Chest radiograph dated 01/17/2023. FINDINGS: Is cardiomegaly with vascular congestion. Left lung base atelectasis or scarring. Trace left pleural effusion. No pneumothorax. 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 No acute osseous pathology. IMPRESSION: Cardiomegaly with vascular congestion. Electronically Signed   By: Elgie Collard M.D.   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 but deferred as already almost reaching end of tx in few days and unlikely benefit - Labs has been done with HD yesterday and has been requested - Plan to start cephalexin 1g po daily at night time after completion of IV course starting 12/9, duration to be determined pending plans for AVR - Fu in a  month - Fu plans from  cardiology regarding AVR  # Medication Monitoring  - 11/12 hb 8.2, ESR 24, CRP 39.5 - 11/19 hb 8.8, CRP 15 - 12/3 wbc 3.15, hb 10.4, platelets pending, CRP pending   # Possible pericarditis with + coxsackie serology - On prednisone taper - Fu with Cardiology  # Smoking  - will benefit from cutting down    I have personally spent 40  minutes involved in face-to-face and non-face-to-face activities for this patient on the day of the visit. Professional time spent includes the following activities: Preparing to see the patient (review of tests), Obtaining and/or reviewing separately obtained history (admission/discharge record), Performing a medically appropriate examination and/or evaluation , Ordering medications/tests/procedures, referring and communicating with other health care professionals, Documenting clinical information in the EMR, Independently interpreting results (not separately reported), Communicating results to the patient/family/caregiver, Counseling and educating the patient/family/caregiver and Care coordination (not separately reported).   Of note, portions of this note may have been created with voice recognition software. While this note has been edited for accuracy, occasional wrong-word or 'sound-a-like' substitutions may have occurred due to the inherent limitations of voice recognition software.   Victoriano Lain, MD [facility]  [facility] 02/17/2023, 9:42 AM

## 2023-02-17 NOTE — Telephone Encounter (Signed)
Error - duplicate message

## 2023-02-17 NOTE — Telephone Encounter (Addendum)
Called [facility] [phone] and requested lab result from 02/16/23 be faxed to [phone].They will send upon receiving results. Advised IV Abx EOT 02/22/23 per Dr. Elinor Parkinson. Spoke to [place].  Also advised [facility] of EOT date for OPAT tracking.

## 2023-02-18 DIAGNOSIS — F17200 Nicotine dependence, unspecified, uncomplicated: Secondary | ICD-10-CM | POA: Insufficient documentation

## 2023-03-01 ENCOUNTER — Encounter (HOSPITAL_COMMUNITY): Payer: Self-pay | Admitting: Emergency Medicine

## 2023-03-01 ENCOUNTER — Inpatient Hospital Stay (HOSPITAL_COMMUNITY)
Admission: EM | Admit: 2023-03-01 | Discharge: 2023-03-04 | DRG: 304 | Disposition: A | Discharge status: Home / Self Care | Payer: 59 | Attending: Internal Medicine | Admitting: Internal Medicine

## 2023-03-01 DIAGNOSIS — I161 Hypertensive emergency: Principal | ICD-10-CM | POA: Diagnosis present

## 2023-03-01 DIAGNOSIS — J449 Chronic obstructive pulmonary disease, unspecified: Secondary | ICD-10-CM | POA: Diagnosis present

## 2023-03-01 DIAGNOSIS — Z955 Presence of coronary angioplasty implant and graft: Secondary | ICD-10-CM

## 2023-03-01 DIAGNOSIS — I428 Other cardiomyopathies: Secondary | ICD-10-CM | POA: Diagnosis present

## 2023-03-01 DIAGNOSIS — Z833 Family history of diabetes mellitus: Secondary | ICD-10-CM

## 2023-03-01 DIAGNOSIS — D631 Anemia in chronic kidney disease: Secondary | ICD-10-CM | POA: Diagnosis present

## 2023-03-01 DIAGNOSIS — I38 Endocarditis, valve unspecified: Secondary | ICD-10-CM | POA: Diagnosis present

## 2023-03-01 DIAGNOSIS — J810 Acute pulmonary edema: Secondary | ICD-10-CM | POA: Diagnosis not present

## 2023-03-01 DIAGNOSIS — F418 Other specified anxiety disorders: Secondary | ICD-10-CM | POA: Diagnosis present

## 2023-03-01 DIAGNOSIS — Z8249 Family history of ischemic heart disease and other diseases of the circulatory system: Secondary | ICD-10-CM

## 2023-03-01 DIAGNOSIS — I132 Hypertensive heart and chronic kidney disease with heart failure and with stage 5 chronic kidney disease, or end stage renal disease: Secondary | ICD-10-CM | POA: Diagnosis present

## 2023-03-01 DIAGNOSIS — J9691 Respiratory failure, unspecified with hypoxia: Secondary | ICD-10-CM | POA: Diagnosis present

## 2023-03-01 DIAGNOSIS — R6510 Systemic inflammatory response syndrome (SIRS) of non-infectious origin without acute organ dysfunction: Secondary | ICD-10-CM | POA: Diagnosis present

## 2023-03-01 DIAGNOSIS — Z8489 Family history of other specified conditions: Secondary | ICD-10-CM

## 2023-03-01 DIAGNOSIS — Z860101 Personal history of adenomatous and serrated colon polyps: Secondary | ICD-10-CM

## 2023-03-01 DIAGNOSIS — Z8349 Family history of other endocrine, nutritional and metabolic diseases: Secondary | ICD-10-CM

## 2023-03-01 DIAGNOSIS — I330 Acute and subacute infective endocarditis: Secondary | ICD-10-CM | POA: Diagnosis present

## 2023-03-01 DIAGNOSIS — I25119 Atherosclerotic heart disease of native coronary artery with unspecified angina pectoris: Secondary | ICD-10-CM | POA: Diagnosis present

## 2023-03-01 DIAGNOSIS — Z9889 Other specified postprocedural states: Secondary | ICD-10-CM

## 2023-03-01 DIAGNOSIS — Z800 Family history of malignant neoplasm of digestive organs: Secondary | ICD-10-CM

## 2023-03-01 DIAGNOSIS — Z8701 Personal history of pneumonia (recurrent): Secondary | ICD-10-CM

## 2023-03-01 DIAGNOSIS — Z91148 Patient's other noncompliance with medication regimen for other reason: Secondary | ICD-10-CM

## 2023-03-01 DIAGNOSIS — I340 Nonrheumatic mitral (valve) insufficiency: Secondary | ICD-10-CM | POA: Diagnosis present

## 2023-03-01 DIAGNOSIS — R079 Chest pain, unspecified: Principal | ICD-10-CM

## 2023-03-01 DIAGNOSIS — Z801 Family history of malignant neoplasm of trachea, bronchus and lung: Secondary | ICD-10-CM

## 2023-03-01 DIAGNOSIS — T826XXA Infection and inflammatory reaction due to cardiac valve prosthesis, initial encounter: Secondary | ICD-10-CM | POA: Diagnosis present

## 2023-03-01 DIAGNOSIS — N186 End stage renal disease: Secondary | ICD-10-CM | POA: Diagnosis present

## 2023-03-01 DIAGNOSIS — Z91199 Patient's noncompliance with other medical treatment and regimen due to unspecified reason: Secondary | ICD-10-CM

## 2023-03-01 DIAGNOSIS — Z79899 Other long term (current) drug therapy: Secondary | ICD-10-CM

## 2023-03-01 DIAGNOSIS — E782 Mixed hyperlipidemia: Secondary | ICD-10-CM | POA: Diagnosis present

## 2023-03-01 DIAGNOSIS — I5042 Chronic combined systolic (congestive) and diastolic (congestive) heart failure: Secondary | ICD-10-CM | POA: Diagnosis present

## 2023-03-01 DIAGNOSIS — Z823 Family history of stroke: Secondary | ICD-10-CM

## 2023-03-01 DIAGNOSIS — Z7982 Long term (current) use of aspirin: Secondary | ICD-10-CM

## 2023-03-01 DIAGNOSIS — Z992 Dependence on renal dialysis: Secondary | ICD-10-CM

## 2023-03-01 DIAGNOSIS — Z8673 Personal history of transient ischemic attack (TIA), and cerebral infarction without residual deficits: Secondary | ICD-10-CM

## 2023-03-01 DIAGNOSIS — F17210 Nicotine dependence, cigarettes, uncomplicated: Secondary | ICD-10-CM | POA: Diagnosis present

## 2023-03-01 DIAGNOSIS — B957 Other staphylococcus as the cause of diseases classified elsewhere: Secondary | ICD-10-CM | POA: Diagnosis present

## 2023-03-01 DIAGNOSIS — I10 Essential (primary) hypertension: Secondary | ICD-10-CM | POA: Diagnosis present

## 2023-03-01 DIAGNOSIS — I252 Old myocardial infarction: Secondary | ICD-10-CM

## 2023-03-01 DIAGNOSIS — I7123 Aneurysm of the descending thoracic aorta, without rupture: Secondary | ICD-10-CM | POA: Diagnosis present

## 2023-03-01 DIAGNOSIS — Y831 Surgical operation with implant of artificial internal device as the cause of abnormal reaction of the patient, or of later complication, without mention of misadventure at the time of the procedure: Secondary | ICD-10-CM | POA: Diagnosis present

## 2023-03-01 DIAGNOSIS — B9561 Methicillin susceptible Staphylococcus aureus infection as the cause of diseases classified elsewhere: Secondary | ICD-10-CM | POA: Diagnosis present

## 2023-03-01 NOTE — ED Triage Notes (Addendum)
Patient BIB EMS from home. Patient with sudden L sided chest pain, non-radiating. Patient with hx of MI,  ESRD on diaylsis - missed last session due to traveling to [facility], where he is being treated for an infected valve.

## 2023-03-02 ENCOUNTER — Emergency Department (HOSPITAL_COMMUNITY): Payer: 59

## 2023-03-02 ENCOUNTER — Inpatient Hospital Stay (HOSPITAL_COMMUNITY): Payer: 59

## 2023-03-02 DIAGNOSIS — I132 Hypertensive heart and chronic kidney disease with heart failure and with stage 5 chronic kidney disease, or end stage renal disease: Secondary | ICD-10-CM | POA: Diagnosis present

## 2023-03-02 DIAGNOSIS — Z992 Dependence on renal dialysis: Secondary | ICD-10-CM | POA: Diagnosis not present

## 2023-03-02 DIAGNOSIS — Z800 Family history of malignant neoplasm of digestive organs: Secondary | ICD-10-CM | POA: Diagnosis not present

## 2023-03-02 DIAGNOSIS — N186 End stage renal disease: Secondary | ICD-10-CM

## 2023-03-02 DIAGNOSIS — J9691 Respiratory failure, unspecified with hypoxia: Secondary | ICD-10-CM | POA: Diagnosis present

## 2023-03-02 DIAGNOSIS — Z823 Family history of stroke: Secondary | ICD-10-CM | POA: Diagnosis not present

## 2023-03-02 DIAGNOSIS — I169 Hypertensive crisis, unspecified: Secondary | ICD-10-CM

## 2023-03-02 DIAGNOSIS — Z801 Family history of malignant neoplasm of trachea, bronchus and lung: Secondary | ICD-10-CM | POA: Diagnosis not present

## 2023-03-02 DIAGNOSIS — Z7982 Long term (current) use of aspirin: Secondary | ICD-10-CM | POA: Diagnosis not present

## 2023-03-02 DIAGNOSIS — I7123 Aneurysm of the descending thoracic aorta, without rupture: Secondary | ICD-10-CM | POA: Diagnosis present

## 2023-03-02 DIAGNOSIS — I25119 Atherosclerotic heart disease of native coronary artery with unspecified angina pectoris: Secondary | ICD-10-CM | POA: Diagnosis present

## 2023-03-02 DIAGNOSIS — Z833 Family history of diabetes mellitus: Secondary | ICD-10-CM | POA: Diagnosis not present

## 2023-03-02 DIAGNOSIS — J810 Acute pulmonary edema: Secondary | ICD-10-CM | POA: Diagnosis present

## 2023-03-02 DIAGNOSIS — I330 Acute and subacute infective endocarditis: Secondary | ICD-10-CM | POA: Diagnosis present

## 2023-03-02 DIAGNOSIS — T826XXA Infection and inflammatory reaction due to cardiac valve prosthesis, initial encounter: Secondary | ICD-10-CM | POA: Diagnosis present

## 2023-03-02 DIAGNOSIS — F17210 Nicotine dependence, cigarettes, uncomplicated: Secondary | ICD-10-CM | POA: Diagnosis present

## 2023-03-02 DIAGNOSIS — E782 Mixed hyperlipidemia: Secondary | ICD-10-CM | POA: Diagnosis present

## 2023-03-02 DIAGNOSIS — R6510 Systemic inflammatory response syndrome (SIRS) of non-infectious origin without acute organ dysfunction: Secondary | ICD-10-CM | POA: Diagnosis present

## 2023-03-02 DIAGNOSIS — I428 Other cardiomyopathies: Secondary | ICD-10-CM | POA: Diagnosis present

## 2023-03-02 DIAGNOSIS — I5042 Chronic combined systolic (congestive) and diastolic (congestive) heart failure: Secondary | ICD-10-CM | POA: Diagnosis present

## 2023-03-02 DIAGNOSIS — D631 Anemia in chronic kidney disease: Secondary | ICD-10-CM | POA: Diagnosis present

## 2023-03-02 DIAGNOSIS — J449 Chronic obstructive pulmonary disease, unspecified: Secondary | ICD-10-CM | POA: Diagnosis present

## 2023-03-02 DIAGNOSIS — I161 Hypertensive emergency: Secondary | ICD-10-CM | POA: Diagnosis present

## 2023-03-02 DIAGNOSIS — Y831 Surgical operation with implant of artificial internal device as the cause of abnormal reaction of the patient, or of later complication, without mention of misadventure at the time of the procedure: Secondary | ICD-10-CM | POA: Diagnosis present

## 2023-03-02 DIAGNOSIS — I340 Nonrheumatic mitral (valve) insufficiency: Secondary | ICD-10-CM | POA: Diagnosis present

## 2023-03-02 DIAGNOSIS — Z8249 Family history of ischemic heart disease and other diseases of the circulatory system: Secondary | ICD-10-CM | POA: Diagnosis not present

## 2023-03-02 DIAGNOSIS — I252 Old myocardial infarction: Secondary | ICD-10-CM | POA: Diagnosis not present

## 2023-03-02 LAB — URINALYSIS, W/ REFLEX TO CULTURE (INFECTION SUSPECTED)
Bacteria, UA: NONE SEEN
Bilirubin Urine: NEGATIVE
Glucose, UA: 50 mg/dL — AB
Hgb urine dipstick: NEGATIVE
Ketones, ur: NEGATIVE mg/dL
Leukocytes,Ua: NEGATIVE
Nitrite: NEGATIVE
Protein, ur: >=300 mg/dL — AB
Specific Gravity, Urine: 1.014 (ref 1.005–1.030)
pH: 5 (ref 5.0–8.0)

## 2023-03-02 LAB — CBC WITH DIFFERENTIAL/PLATELET
Abs Immature Granulocytes: 0.02 10*3/uL (ref 0.00–0.07)
Basophils Absolute: 0 10*3/uL (ref 0.0–0.1)
Basophils Relative: 1 %
Eosinophils Absolute: 0.2 10*3/uL (ref 0.0–0.5)
Eosinophils Relative: 2 %
HCT: 32.3 % — ABNORMAL LOW (ref 39.0–52.0)
Hemoglobin: 10.3 g/dL — ABNORMAL LOW (ref 13.0–17.0)
Immature Granulocytes: 0 %
Lymphocytes Relative: 9 %
Lymphs Abs: 0.7 10*3/uL (ref 0.7–4.0)
MCH: 31.6 pg (ref 26.0–34.0)
MCHC: 31.9 g/dL (ref 30.0–36.0)
MCV: 99.1 fL (ref 80.0–100.0)
Monocytes Absolute: 0.5 10*3/uL (ref 0.1–1.0)
Monocytes Relative: 7 %
Neutro Abs: 6.3 10*3/uL (ref 1.7–7.7)
Neutrophils Relative %: 81 %
Platelets: 236 10*3/uL (ref 150–400)
RBC: 3.26 MIL/uL — ABNORMAL LOW (ref 4.22–5.81)
RDW: 15 % (ref 11.5–15.5)
WBC: 7.7 10*3/uL (ref 4.0–10.5)
nRBC: 0 % (ref 0.0–0.2)

## 2023-03-02 LAB — I-STAT CG4 LACTIC ACID, ED
Lactic Acid, Venous: 0.5 mmol/L (ref 0.5–1.9)
Lactic Acid, Venous: 0.5 mmol/L (ref 0.5–1.9)

## 2023-03-02 LAB — PROTIME-INR
INR: 1.2 (ref 0.8–1.2)
Prothrombin Time: 15.7 s — ABNORMAL HIGH (ref 11.4–15.2)

## 2023-03-02 LAB — RENAL FUNCTION PANEL
Albumin: 3 g/dL — ABNORMAL LOW (ref 3.5–5.0)
Anion gap: 9 (ref 5–15)
BUN: 41 mg/dL — ABNORMAL HIGH (ref 6–20)
CO2: 19 mmol/L — ABNORMAL LOW (ref 22–32)
Calcium: 8.7 mg/dL — ABNORMAL LOW (ref 8.9–10.3)
Chloride: 110 mmol/L (ref 98–111)
Creatinine, Ser: 8.26 mg/dL — ABNORMAL HIGH (ref 0.61–1.24)
GFR, Estimated: 7 mL/min — ABNORMAL LOW (ref >60)
Glucose, Bld: 85 mg/dL (ref 70–99)
Phosphorus: 5.6 mg/dL — ABNORMAL HIGH (ref 2.5–4.6)
Potassium: 5.6 mmol/L — ABNORMAL HIGH (ref 3.5–5.1)
Sodium: 138 mmol/L (ref 135–145)

## 2023-03-02 LAB — BRAIN NATRIURETIC PEPTIDE: B Natriuretic Peptide: >4500 pg/mL — ABNORMAL HIGH (ref 0.0–100.0)

## 2023-03-02 LAB — COMPREHENSIVE METABOLIC PANEL
ALT: 6 U/L (ref 0–44)
AST: 12 U/L — ABNORMAL LOW (ref 15–41)
Albumin: 2.9 g/dL — ABNORMAL LOW (ref 3.5–5.0)
Alkaline Phosphatase: 62 U/L (ref 38–126)
Anion gap: 12 (ref 5–15)
BUN: 40 mg/dL — ABNORMAL HIGH (ref 6–20)
CO2: 18 mmol/L — ABNORMAL LOW (ref 22–32)
Calcium: 8.8 mg/dL — ABNORMAL LOW (ref 8.9–10.3)
Chloride: 110 mmol/L (ref 98–111)
Creatinine, Ser: 7.85 mg/dL — ABNORMAL HIGH (ref 0.61–1.24)
GFR, Estimated: 8 mL/min — ABNORMAL LOW (ref >60)
Glucose, Bld: 88 mg/dL (ref 70–99)
Potassium: 4.2 mmol/L (ref 3.5–5.1)
Sodium: 140 mmol/L (ref 135–145)
Total Bilirubin: 0.6 mg/dL (ref <1.2)
Total Protein: 6.1 g/dL — ABNORMAL LOW (ref 6.5–8.1)

## 2023-03-02 LAB — MAGNESIUM: Magnesium: 2.1 mg/dL (ref 1.7–2.4)

## 2023-03-02 LAB — CBC
HCT: 32.4 % — ABNORMAL LOW (ref 39.0–52.0)
Hemoglobin: 10 g/dL — ABNORMAL LOW (ref 13.0–17.0)
MCH: 31.3 pg (ref 26.0–34.0)
MCHC: 30.9 g/dL (ref 30.0–36.0)
MCV: 101.6 fL — ABNORMAL HIGH (ref 80.0–100.0)
Platelets: 139 10*3/uL — ABNORMAL LOW (ref 150–400)
RBC: 3.19 MIL/uL — ABNORMAL LOW (ref 4.22–5.81)
RDW: 14.8 % (ref 11.5–15.5)
WBC: 11 10*3/uL — ABNORMAL HIGH (ref 4.0–10.5)
nRBC: 0 % (ref 0.0–0.2)

## 2023-03-02 LAB — TROPONIN I (HIGH SENSITIVITY)
Troponin I (High Sensitivity): 55 ng/L — ABNORMAL HIGH (ref <18)
Troponin I (High Sensitivity): 64 ng/L — ABNORMAL HIGH (ref <18)

## 2023-03-02 LAB — GLUCOSE, CAPILLARY: Glucose-Capillary: 92 mg/dL (ref 70–99)

## 2023-03-02 LAB — RESP PANEL BY RT-PCR (RSV, FLU A&B, COVID)  RVPGX2
Influenza A by PCR: NEGATIVE
Influenza B by PCR: NEGATIVE
Resp Syncytial Virus by PCR: NEGATIVE
SARS Coronavirus 2 by RT PCR: NEGATIVE

## 2023-03-02 LAB — APTT: aPTT: 37 s — ABNORMAL HIGH (ref 24–36)

## 2023-03-02 LAB — PROCALCITONIN: Procalcitonin: 3.46 ng/mL

## 2023-03-02 LAB — MRSA NEXT GEN BY PCR, NASAL: MRSA by PCR Next Gen: NOT DETECTED

## 2023-03-02 LAB — HEPATITIS B SURFACE ANTIGEN: Hepatitis B Surface Ag: NONREACTIVE

## 2023-03-02 LAB — PHOSPHORUS: Phosphorus: 5.6 mg/dL — ABNORMAL HIGH (ref 2.5–4.6)

## 2023-03-02 MED ORDER — HYDRALAZINE HCL 50 MG PO TABS
100.0000 mg | ORAL_TABLET | Freq: Three times a day (TID) | ORAL | Status: DC
  Administered 2023-03-02 – 2023-03-04 (×5): 100 mg via ORAL
  Filled 2023-03-02 (×5): qty 2

## 2023-03-02 MED ORDER — OXYCODONE HCL 5 MG PO TABS
5.0000 mg | ORAL_TABLET | Freq: Four times a day (QID) | ORAL | Status: DC | PRN
  Administered 2023-03-03 (×2): 5 mg via ORAL
  Filled 2023-03-02 (×2): qty 1

## 2023-03-02 MED ORDER — ACETAMINOPHEN 325 MG PO TABS
650.0000 mg | ORAL_TABLET | ORAL | Status: DC | PRN
  Administered 2023-03-02: 650 mg via ORAL
  Filled 2023-03-02: qty 2

## 2023-03-02 MED ORDER — HEPARIN SODIUM (PORCINE) 5000 UNIT/ML IJ SOLN
5000.0000 [IU] | Freq: Three times a day (TID) | INTRAMUSCULAR | Status: DC
Start: 2023-03-02 — End: 2023-03-04
  Administered 2023-03-02 – 2023-03-04 (×5): 5000 [IU] via SUBCUTANEOUS
  Filled 2023-03-02 (×5): qty 1

## 2023-03-02 MED ORDER — CHLORHEXIDINE GLUCONATE CLOTH 2 % EX PADS
6.0000 | MEDICATED_PAD | Freq: Every day | CUTANEOUS | Status: DC
Start: 2023-03-02 — End: 2023-03-03
  Administered 2023-03-03: 6 via TOPICAL

## 2023-03-02 MED ORDER — ORAL CARE MOUTH RINSE: 15.0000 mL | OROMUCOSAL | Status: DC | PRN

## 2023-03-02 MED ORDER — IPRATROPIUM-ALBUTEROL 0.5-2.5 (3) MG/3ML IN SOLN: 3.0000 mL | RESPIRATORY_TRACT | Status: DC | PRN

## 2023-03-02 MED ORDER — NEPRO/CARBSTEADY PO LIQD: 237.0000 mL | ORAL | Status: DC | PRN

## 2023-03-02 MED ORDER — DOCUSATE SODIUM 100 MG PO CAPS
100.0000 mg | ORAL_CAPSULE | Freq: Two times a day (BID) | ORAL | Status: DC | PRN
  Administered 2023-03-02: 100 mg via ORAL
  Filled 2023-03-02: qty 1

## 2023-03-02 MED ORDER — PENTAFLUOROPROP-TETRAFLUOROETH EX AERO: 1.0000 | INHALATION_SPRAY | CUTANEOUS | Status: DC | PRN

## 2023-03-02 MED ORDER — BUDESONIDE 0.5 MG/2ML IN SUSP
0.5000 mg | Freq: Two times a day (BID) | RESPIRATORY_TRACT | Status: DC
  Administered 2023-03-02 – 2023-03-03 (×4): 0.5 mg via RESPIRATORY_TRACT
  Filled 2023-03-02 (×5): qty 2

## 2023-03-02 MED ORDER — REVEFENACIN 175 MCG/3ML IN SOLN
175.0000 ug | Freq: Every day | RESPIRATORY_TRACT | Status: DC
  Administered 2023-03-02 – 2023-03-03 (×2): 175 ug via RESPIRATORY_TRACT
  Filled 2023-03-02 (×3): qty 3

## 2023-03-02 MED ORDER — HYDROMORPHONE HCL 1 MG/ML IJ SOLN
0.5000 mg | Freq: Once | INTRAMUSCULAR | Status: AC
  Administered 2023-03-02: 0.5 mg via INTRAVENOUS
  Filled 2023-03-02: qty 0.5

## 2023-03-02 MED ORDER — CARVEDILOL 12.5 MG PO TABS
12.5000 mg | ORAL_TABLET | Freq: Two times a day (BID) | ORAL | Status: DC
  Administered 2023-03-02 – 2023-03-03 (×3): 12.5 mg via ORAL
  Filled 2023-03-02 (×3): qty 1

## 2023-03-02 MED ORDER — ONDANSETRON HCL 4 MG/2ML IJ SOLN
4.0000 mg | Freq: Four times a day (QID) | INTRAMUSCULAR | Status: DC | PRN
Start: 2023-03-02 — End: 2023-03-04

## 2023-03-02 MED ORDER — HEPARIN SODIUM (PORCINE) 1000 UNIT/ML DIALYSIS: 1000.0000 [IU] | INTRAMUSCULAR | Status: DC | PRN

## 2023-03-02 MED ORDER — LIDOCAINE HCL (PF) 1 % IJ SOLN: 5.0000 mL | INTRAMUSCULAR | Status: DC | PRN

## 2023-03-02 MED ORDER — NITROGLYCERIN IN D5W 200-5 MCG/ML-% IV SOLN
INTRAVENOUS | Status: AC
  Administered 2023-03-02: 15 ug/min via INTRAVENOUS
  Filled 2023-03-02: qty 250

## 2023-03-02 MED ORDER — LACTATED RINGERS IV BOLUS
1000.0000 mL | Freq: Once | INTRAVENOUS | Status: AC
  Administered 2023-03-02: 1000 mL via INTRAVENOUS

## 2023-03-02 MED ORDER — LIDOCAINE-PRILOCAINE 2.5-2.5 % EX CREA
1.0000 | TOPICAL_CREAM | CUTANEOUS | Status: DC | PRN
  Filled 2023-03-02: qty 5

## 2023-03-02 MED ORDER — FUROSEMIDE 10 MG/ML IJ SOLN
20.0000 mg | Freq: Once | INTRAMUSCULAR | Status: AC
Start: 2023-03-02 — End: 2023-03-02
  Administered 2023-03-02: 20 mg via INTRAVENOUS
  Filled 2023-03-02: qty 2

## 2023-03-02 MED ORDER — ALTEPLASE 2 MG IJ SOLR: 2.0000 mg | Freq: Once | INTRAMUSCULAR | Status: DC | PRN

## 2023-03-02 MED ORDER — ANTICOAGULANT SODIUM CITRATE 4% (200MG/5ML) IV SOLN
5.0000 mL | Status: DC | PRN
  Filled 2023-03-02: qty 5

## 2023-03-02 MED ORDER — VANCOMYCIN HCL 750 MG/150ML IV SOLN
750.0000 mg | INTRAVENOUS | Status: DC
  Administered 2023-03-02: 750 mg via INTRAVENOUS
  Filled 2023-03-02 (×2): qty 150

## 2023-03-02 MED ORDER — SODIUM CHLORIDE 0.9 % IV SOLN: 500.0000 mg | Freq: Once | INTRAVENOUS | Status: DC

## 2023-03-02 MED ORDER — IOHEXOL 350 MG/ML SOLN
75.0000 mL | Freq: Once | INTRAVENOUS | Status: AC | PRN
  Administered 2023-03-02: 75 mL via INTRAVENOUS

## 2023-03-02 MED ORDER — DEXMEDETOMIDINE HCL IN NACL 400 MCG/100ML IV SOLN
0.0000 ug/kg/h | INTRAVENOUS | Status: DC
  Administered 2023-03-02: 1.2 ug/kg/h via INTRAVENOUS
  Filled 2023-03-02 (×2): qty 100

## 2023-03-02 MED ORDER — VANCOMYCIN HCL 1.5 G IV SOLR
1500.0000 mg | Freq: Once | INTRAVENOUS | Status: AC
  Administered 2023-03-02: 1500 mg via INTRAVENOUS
  Filled 2023-03-02: qty 30

## 2023-03-02 MED ORDER — POLYETHYLENE GLYCOL 3350 17 G PO PACK: 17.0000 g | PACK | Freq: Every day | ORAL | Status: DC | PRN

## 2023-03-02 MED ORDER — SODIUM CHLORIDE 0.9 % IV SOLN
2.0000 g | Freq: Once | INTRAVENOUS | Status: AC
  Administered 2023-03-02: 2 g via INTRAVENOUS
  Filled 2023-03-02: qty 20

## 2023-03-02 MED ORDER — NITROGLYCERIN IN D5W 200-5 MCG/ML-% IV SOLN: 0.0000 ug/min | INTRAVENOUS | Status: DC

## 2023-03-02 MED ORDER — AMLODIPINE BESYLATE 10 MG PO TABS
10.0000 mg | ORAL_TABLET | Freq: Every day | ORAL | Status: DC
  Administered 2023-03-02 – 2023-03-04 (×3): 10 mg via ORAL
  Filled 2023-03-02 (×3): qty 1

## 2023-03-02 MED ORDER — SODIUM CHLORIDE 0.9 % IV SOLN
2.0000 g | INTRAVENOUS | Status: DC
  Administered 2023-03-02: 2 g via INTRAVENOUS
  Filled 2023-03-02: qty 20

## 2023-03-02 MED ORDER — FENTANYL CITRATE PF 50 MCG/ML IJ SOSY
50.0000 ug | PREFILLED_SYRINGE | Freq: Once | INTRAMUSCULAR | Status: AC
Start: 2023-03-02 — End: 2023-03-02
  Administered 2023-03-02: 50 ug via INTRAVENOUS
  Filled 2023-03-02: qty 1

## 2023-03-02 NOTE — ED Notes (Signed)
Patient is becoming more comfortable since being on the Bi-Pap and nitroglycerin.

## 2023-03-02 NOTE — ED Notes (Signed)
Patient c/o worsening ShOB, diaphoretic, worsening tachypnea - MD Bero to the bedside.  RT called for Bi-pap & verbal order to start [facility]

## 2023-03-02 NOTE — ED Provider Notes (Signed)
[facility]EM[facility] Emergency Department Provider Note MRN:  [id number]  Arrival date & time: 03/02/23     Chief Complaint   Chest Pain   History of Present Illness   [name] is a 52 y.o. year-old male with a history of aortic dissection, CAD, cardiomyopathy, ESRD presenting to the ED with chief complaint of chest pain.  Chest pain as well as low back pain, left flank pain, lower abdominal pain intermittent over the past 2 days, severely worsened about 2 hours ago.  Explains that he is being treated at [facility] for a infected valve.  Noticed that he started feeling worse after being transition from IV ampicillin to oral antibiotics.  Denies cough.  Review of Systems  A thorough review of systems was obtained and all systems are negative except as noted in the HPI and PMH.   Patient's Health History    Past Medical History:  Diagnosis Date   Adenomatous colon polyp 08/2015   Anxiety    Aortic disease (HCC)    Aortic dissection (HCC)    a. admx 04/2014 >> L renal infarct; a/c renal failure >> b.  s/p Bioprosthetic Bentall and total arch replacement and staged endovascular repair of descending aortic aneurysm ([facility] - Dr. Kizzie Bane)   CAD (coronary artery disease)    a. LHC 4/16:  oD1 60%   Cardiomyopathy (HCC)    a. non-ischemic - probably related to untreated HTN and ETOH abuse - Echo 3/13 with EF 35-40% >> b. Echo 4/16: Severe LVH, EF 55-60%, moderate AI, moderate MR, mild LAE, trivial effusion, known type B dissection with communication between true and false lumens with suprasternal images suggesting dissection plane may propagate to at least left subclavian takeoff, root above aortic valve okay     Chronic abdominal pain    Chronic combined systolic and diastolic congestive heart failure (HCC)    a. 05/2011: Adm with pulm edema/HTN urgency, EF 35-40% with diffuse hypokinesis and moderate to severe mitral regurgitation. Cardiomyopathy likely due to uncontrolled HTN and  ETOH abuse - cath deferred due to renal insufficiency (felt due to uncontrolled HTN). bElzie Rings MV 06/2011: EF 37% and no ischemia or infarction. c. EF 45-50% by echo 01/2012.   Chronic sinusitis    DDD (degenerative disc disease), lumbar    Depression    Descending thoracic aortic aneurysm (HCC)    Dissecting aneurysm of thoracic aorta (HCC)    ESRD (end stage renal disease) (HCC)    on dialysis 1-2 days per week (2 days ordered, only coming 1 day per week as of oct 2024). on dialysis 3-4 years total   ETOH abuse    a. Reported to have quit 05/2011.   Frequent headaches    GERD (gastroesophageal reflux disease)    Headache(784.0)    "q other day" (08/08/2013)   Hemorrhoid thrombosis    History of echocardiogram    Echo 1/17:  Severe LVH, EF 55-60%, no RWMA, Gr 2 DD, AVR ok, mild to mod MR, mild LAE, mild reduced RVSF, mod RAE   History of medication noncompliance    HYPERLIPIDEMIA    Hypertension    a. Hx of HTN urgency secondary to noncompliance. b. urinary metanephrine and catecholeamine levels normal 2013.  c. Renal art [name] 1/16:  No evidence of renal artery stenosis noted bilaterally.   INGUINAL HERNIA    Pneumonia ~ 2013   Serrated adenoma of colon 08/2015   Stroke [facility])    Tobacco abuse  Valvular heart disease    a. Echo 05/2011: moderate to severe eccentric MR and mild to moderate AI with prolapsing left coronary cusp. b. Echo 01/2012: mild-mod AI, mild dilitation of aortic root, mild MR.;  c. Echo 1/16: Severe LVH consistent with hypertrophic cardio myopathy, EF 50%, no RWMA, mod AI, mild MR, mild RAE, dilated Ao root (40 mm);     Past Surgical History:  Procedure Laterality Date   A/V FISTULAGRAM Left 03/07/2021   Procedure: A/V FISTULAGRAM;  Surgeon: Chuck Hint, MD;  Location: [facility] INVASIVE CV LAB;  Service: Cardiovascular;  Laterality: Left;   A/V FISTULAGRAM Left 07/18/2021   Procedure: A/V Fistulagram;  Surgeon: Chuck Hint, MD;  Location: [facility] INVASIVE  CV LAB;  Service: Cardiovascular;  Laterality: Left;   A/V FISTULAGRAM Left 12/25/2022   Procedure: A/V Fistulagram;  Surgeon: Leonie Douglas, MD;  Location: [facility] INVASIVE CV LAB;  Service: Cardiovascular;  Laterality: Left;   ANKLE SURGERY Bilateral    Fractures bilaterally   AORTIC VALVE SURGERY  09/2014   AV FISTULA PLACEMENT Left 09/09/2020   Procedure: LEFT ARM ARTERIOVENOUS (AV) FISTULA CREATION;  Surgeon: Leonie Douglas, MD;  Location[facility] OR;  Service: Vascular;  Laterality: Left;   BASCILIC VEIN TRANSPOSITION Left 10/09/2020   Procedure: LEFT SECOND STAGE BASILIC VEIN TRANSPOSITION;  Surgeon: Leonie Douglas, MD;  Location: [facility] OR;  Service: Vascular;  Laterality: Left;  PERIPHERAL NERVE BLOCK   CORONARY ANGIOGRAPHY N/A 12/06/2017   Procedure: CORONARY ANGIOGRAPHY;  Surgeon: Lyn Records, MD;  [facility] INVASIVE CV LAB;  Service: Cardiovascular;  Laterality: N/A;   CORONARY ANGIOGRAPHY N/A 09/26/2018   Procedure: CORONARY ANGIOGRAPHY;  Surgeon: Yvonne Kendall, MD;  [facility] INVASIVE CV LAB;  Service: Cardiovascular;  Laterality: N/A;   CORONARY STENT INTERVENTION N/A 12/10/2017   Procedure: CORONARY STENT INTERVENTION;  Surgeon: Lennette Bihari, MD;  [facility] INVASIVE CV LAB;  Service: Cardiovascular;  Laterality: N/A;   FOOT FRACTURE SURGERY Bilateral 2004-2010   "got pins in both of them"   HEMORRHOID SURGERY N/A 06/15/2015   Procedure: HEMORRHOIDECTOMY;  Surgeon: Almond Lint, MD;  Location[facility] OR;  Service: General;  Laterality: N/A;   INGUINAL HERNIA REPAIR Right ~ 1996   INSERTION OF DIALYSIS CATHETER N/A 09/09/2020   Procedure: INSERTION OF TUNNELED DIALYSIS PALINDROME 19cm CATHETER;  Surgeon: Leonie Douglas, MD;  Location[facility] OR;  Service: Vascular;  Laterality: N/A;   LEFT HEART CATH AND CORONARY ANGIOGRAPHY N/A 02/05/2021   Procedure: LEFT HEART CATH AND CORONARY ANGIOGRAPHY;  Surgeon: Laurey Morale, MD;  [facility] INVAS[facility] CV LAB;  Service: Cardiovascular;   Laterality: N/A;   LEFT HEART CATH AND CORONARY ANGIOGRAPHY N/A 01/22/2023   Procedure: LEFT HEART CATH AND CORONARY ANGIOGRAPHY;  Surgeon: [place], Peter M, MD;  Location: [facility] INVASIVE CV LAB;  Service: Cardiovascular;  Laterality: N/A;   LEFT HEART CATHETERIZATION WITH CORONARY ANGIOGRAM N/A 06/21/2014   Procedure: LEFT HEART CATHETERIZATION WITH CORONARY ANGIOGRAM;  Surgeon: Laurey Morale, MD;  Location: [facility] CATH LAB;  Service: Cardiovascular;  Laterality: N/A;   LOOP RECORDER INSERTION N/A 06/19/2016   Procedure: Loop Recorder Insertion;  Surgeon: [facility] Salvia, MD;  Location: [facility] INVASIVE CV LAB;  Service: Cardiovascular;  Laterality: N/A;   PERIPHERAL VASCULAR BALLOON ANGIOPLASTY  03/07/2021   Procedure: PERIPHERAL VASCULAR BALLOON ANGIOPLASTY;  Surgeon: Chuck Hint, MD;  Location: [facility] INVASIVE CV LAB;  Service: Cardiovascular;;   PERIPHERAL VASCULAR BALLOON ANGIOPLASTY  07/18/2021   Procedure: PERIPHERAL  VASCULAR BALLOON ANGIOPLASTY;  Surgeon: Chuck Hint, MD;  Location: [facility] INVASIVE CV LAB;  Service: Cardiovascular;;   PERIPHERAL VASCULAR INTERVENTION Left 12/25/2022   Procedure: PERIPHERAL VASCULAR INTERVENTION;  Surgeon: Leonie Douglas, MD;  [facility] INVAS[facility] CV LAB;  Service: Cardiovascular;  Laterality: Left;  stent to brachiocephalic vein   PERIPHERAL VASCULAR ULTRASOUND/IVUS Left 12/25/2022   Procedure: Peripheral Vascular Ultrasound/IVUS;  Surgeon: Leonie Douglas, MD;  Location: [facility] INVASIVE CV LAB;  Service: Cardiovascular;  Laterality: Left;   TEE WITHOUT CARDIOVERSION N/A 03/12/2016   Procedure: TRANSESOPHAGEAL ECHOCARDIOGRAM (TEE);  Surgeon: Thurmon Fair, MD;  Location: [facility] ENDOSCOPY;  Service: Cardiovascular;  Laterality: N/A;   TEE WITHOUT CARDIOVERSION N/A 10/16/2020   Procedure: TRANSESOPHAGEAL ECHOCARDIOGRAM (TEE);  Surgeon: Laurey Morale, MD;  Location: [facility] ENDOSCOPY;  Service: Cardiovascular;  Laterality: N/A;   TRANSESOPHAGEAL ECHOCARDIOGRAM (CATH  LAB) N/A 01/13/2023   Procedure: TRANSESOPHAGEAL ECHOCARDIOGRAM;  Surgeon: Quintella Reichert, MD;  Location: [facility] INVASIVE CV LAB;  Service: Cardiovascular;  Laterality: N/A;   UPPER EXTREMITY VENOGRAPHY Right 07/18/2021   Procedure: UPPER EXTREMITY VENOGRAPHY;  Surgeon: Chuck Hint, MD;  Location: [facility] INVASIVE CV LAB;  Service: Cardiovascular;  Laterality: Right;    Family History  Problem Relation Age of Onset   Hypertension Mother    Hypertension Other    Colon cancer Paternal Uncle    Stroke Maternal Aunt    Heart attack Brother    Hypertension Brother    Diabetes Maternal Aunt    Lung cancer Maternal Uncle    Stroke Maternal Uncle    Other Sister        "breathing machine at night"   Headache Sister    Thyroid disease Sister    Throat cancer Neg Hx    Pancreatic cancer Neg Hx    Esophageal cancer Neg Hx    Kidney disease Neg Hx    Liver disease Neg Hx     Social History   Socioeconomic History   Marital status: Married    Spouse name: Not on file   Number of children: 5   Years of education: 12   Highest education level: Not on file  Occupational History   Occupation: Disabled, part-time Corporate investment banker    Comment: Disability  Tobacco Use   Smoking status: Some Days    Current packs/day: 0.25    Average packs/day: 0.3 packs/day for 23.0 years (5.8 ttl pk-yrs)    Types: Cigarettes    Passive exposure: Current   Smokeless tobacco: Never   Tobacco comments:    3 to 4 per day  Vaping Use   Vaping status: Not on file  Substance and Sexual Activity   Alcohol use: Not Currently    Alcohol/week: 1.0 standard drink of alcohol    Types: 1 Cans of beer per week    Comment: On special occasion   Drug use: Yes    Types: Marijuana    Comment: 2 times/week - last use 10/06/20   Sexual activity: Yes  Other Topics Concern   Not on file  Social History Narrative   Fun: Enjoy his children   Social Drivers of Corporate investment banker Strain: Low Risk   (07/07/2022)   Received from [facility], [facility]   Overall Financial Resource Strain (CARDIA)    Difficulty of Paying Living Expenses: Not hard at all  Food Insecurity: No Food Insecurity (01/17/2023)   Hunger Vital Sign    Worried About Running Out of Food in the  Last Year: Never true    Ran Out of Food in the Last Year: Never true  Transportation Needs: No Transportation Needs (01/17/2023)   PRAPARE - Administrator, Civil Service (Medical): No    Lack of Transportation (Non-Medical): No  Physical Activity: Not on file  Stress: Not on file  Social Connections: Unknown (05/11/2022)   Received from [facility], [facility]   Social Network    Social Network: Not on file  Intimate Partner Violence: Not At Risk (01/17/2023)   Humiliation, Afraid, Rape, and Kick questionnaire    Fear of Current or Ex-Partner: No    Emotionally Abused: No    Physically Abused: No    Sexually Abused: No     Physical Exam   Vitals:   03/02/23 0700 03/02/23 0720  BP: (!) 144/86 (!) 151/91  Pulse: 79 81  Resp: (!) 25 18  Temp:    SpO2: 96% 98%    CONSTITUTIONAL: Ill-appearing, NAD NEURO/PSYCH:  Alert and oriented x 3, no focal deficits EYES:  eyes equal and reactive ENT/NECK:  no LAD, no JVD CARDIO: Tachycardic rate, well-perfused, normal S1 and S2 PULM:  CTAB no wheezing or rhonchi, mildly tachypneic GI/GU:  non-distended, generalized tenderness MSK/SPINE:  No gross deformities, no edema SKIN:  no rash, atraumatic   *Additional and/or pertinent findings included in MDM below  Diagnostic and Interventional Summary    EKG Interpretation Date/Time:  Monday March 01 2023 23:43:21 EST Ventricular Rate:  108 PR Interval:  184 QRS Duration:  98 QT Interval:  341 QTC Calculation: 457 R Axis:   74  Text Interpretation: Sinus tachycardia Biatrial enlargement Left ventricular hypertrophy Repol abnrm suggests ischemia, lateral leads Anterior ST elevation, probably due to  LVH Confirmed by Kennis Carina [phone]) on 03/01/2023 11:52:51 PM       Labs Reviewed  CBC WITH DIFFERENTIAL/PLATELET - Abnormal; Notable for the following components:      Result Value   RBC 3.26 (*)    Hemoglobin 10.3 (*)    HCT 32.3 (*)    All other components within normal limits  URINALYSIS, W/ REFLEX TO CULTURE (INFECTION SUSPECTED) - Abnormal; Notable for the following components:   Glucose, UA 50 (*)    Protein, ur >=300 (*)    All other components within normal limits  PROTIME-INR - Abnormal; Notable for the following components:   Prothrombin Time 15.7 (*)    All other components within normal limits  APTT - Abnormal; Notable for the following components:   aPTT 37 (*)    All other components within normal limits  COMPREHENSIVE METABOLIC PANEL - Abnormal; Notable for the following components:   CO2 18 (*)    BUN 40 (*)    Creatinine, Ser 7.85 (*)    Calcium 8.8 (*)    Total Protein 6.1 (*)    Albumin 2.9 (*)    AST 12 (*)    GFR, Estimated 8 (*)    All other components within normal limits  BRAIN NATRIURETIC PEPTIDE - Abnormal; Notable for the following components:   B Natriuretic Peptide >4,500.0 (*)    All other components within normal limits  CBC - Abnormal; Notable for the following components:   WBC 11.0 (*)    RBC 3.19 (*)    Hemoglobin 10.0 (*)    HCT 32.4 (*)    MCV 101.6 (*)    Platelets 139 (*)    All other components within normal limits  PHOSPHORUS - Abnormal; Notable  for the following components:   Phosphorus 5.6 (*)    All other components within normal limits  RENAL FUNCTION PANEL - Abnormal; Notable for the following components:   Potassium 5.6 (*)    CO2 19 (*)    BUN 41 (*)    Creatinine, Ser 8.26 (*)    Calcium 8.7 (*)    Phosphorus 5.6 (*)    Albumin 3.0 (*)    GFR, Estimated 7 (*)    All other components within normal limits  TROPONIN I (HIGH SENSITIVITY) - Abnormal; Notable for the following components:   Troponin I (High  Sensitivity) 55 (*)    All other components within normal limits  TROPONIN I (HIGH SENSITIVITY) - Abnormal; Notable for the following components:   Troponin I (High Sensitivity) 64 (*)    All other components within normal limits  RESP PANEL BY RT-PCR (RSV, FLU A&B, COVID)  RVPGX2  CULTURE, BLOOD (ROUTINE X 2)  CULTURE, BLOOD (ROUTINE X 2)  MAGNESIUM  HEPATITIS B SURFACE ANTIGEN  HEPATITIS B SURFACE ANTIBODY, QUANTITATIVE  I-STAT CG4 LACTIC ACID, ED  I-STAT CG4 LACTIC ACID, ED    DG Chest Port 1 View  Final Result    DG Chest Port 1 View  Final Result    CT Angio Chest/Abd/Pel for Dissection W and/or Wo Contrast    (Results Pending)    Medications  nitroGLYCERIN 50 mg in dextrose 5 % 250 mL (0.2 mg/mL) infusion (80 mcg/min Intravenous Rate/Dose Change 03/02/23 0732)  docusate sodium (COLACE) capsule 100 mg (has no administration in time range)  polyethylene glycol (MIRALAX / GLYCOLAX) packet 17 g (has no administration in time range)  heparin injection 5,000 Units (has no administration in time range)  acetaminophen (TYLENOL) tablet 650 mg (has no administration in time range)  ondansetron (ZOFRAN) injection 4 mg (has no administration in time range)  dexmedetomidine (PRECEDEX) 400 MCG/100ML (4 mcg/mL) infusion (has no administration in time range)  Chlorhexidine Gluconate Cloth 2 % PADS 6 each (has no administration in time range)  lactated ringers bolus 1,000 mL (0 mLs Intravenous Stopped 03/02/23 0242)  cefTRIAXone (ROCEPHIN) 2 g in sodium chloride 0.9 % 100 mL IVPB (0 g Intravenous Stopped 03/02/23 0137)  fentaNYL (SUBLIMAZE) injection 50 mcg (50 mcg Intravenous Given 03/02/23 0058)  Vancomycin (VANCOCIN) 1,500 mg in sodium chloride 0.9 % 500 mL IVPB (0 mg Intravenous Stopped 03/02/23 0316)  furosemide (LASIX) injection 20 mg (20 mg Intravenous Given 03/02/23 0422)     Procedures  /  Critical Care .Critical Care  Performed by: Sabas Sous, MD Authorized by: Sabas Sous, MD   Critical care provider statement:    Critical care time (minutes):  35   Critical care was necessary to treat or prevent imminent or life-threatening deterioration of the following conditions:  Respiratory failure   Critical care was time spent personally by me on the following activities:  Development of treatment plan with patient or surrogate, discussions with consultants, evaluation of patient's response to treatment, examination of patient, ordering and review of laboratory studies, ordering and review of radiographic studies, ordering and performing treatments and interventions, pulse oximetry, re-evaluation of patient's condition and review of old charts   ED Course and Medical Decision Making  Initial Impression and Ddx Patient presents with new onset hypoxic respiratory failure.  Requiring 6 L nasal cannula to maintain his saturations in the 90s.  He is mildly tachypneic but feels a lot better with the oxygen.  He continues to  have both chest and flank and abdominal pain.  He has a history of aortic dissection and some course this is considered.  He is also mildly tachycardic, tachypneic with an oral temperature of 99.9.  Given his reported infectious history sepsis, bacteremia, infected valve is also considered.  I do not see any specific documentation of infected heart valve from data in Care Everywhere however.  Starting empiric antibiotics, code sepsis.  Past medical/surgical history that increases complexity of ED encounter: ESRD, CHF, ACS  Interpretation of Diagnostics I personally reviewed the EKG and my interpretation is as follows: Sinus rhythm, LVH pattern  Labs reveal mildly elevated troponin, markedly elevated BNP  Patient Reassessment and Ultimate Disposition/Management     After only 500 cc of fluid patient began having worsening shortness of breath which became severe with gasping for air, leaning up in the bed.  Quite hypertensive, rhonchi noted.  Seems  consistent with flash pulmonary edema.  Started on BiPAP, nitroglycerin drip.  Patient improved after this and is now resting comfortably.  Patient admitted to ICU for further care.  Nephrology consulted.  Patient management required discussion with the following services or consulting groups:  Intensivist Service  Complexity of Problems Addressed Acute illness or injury that poses threat of life of bodily function  Additional Data Reviewed and Analyzed Further history obtained from: Further history from spouse/family member  Additional Factors Impacting ED Encounter Risk Consideration of hospitalization  Elmer Sow. Pilar Plate, MD [facility] Emergency Medicine [facility] [email or web address]  Final Clinical Impressions(s) / ED Diagnoses     ICD-10-CM   1. Chest pain, unspecified type  R07.9     2. Flash pulmonary edema (HCC)  J81.0       ED Discharge Orders     None        Discharge Instructions Discussed with and Provided to Patient:   Discharge Instructions   None      Sabas Sous, MD 03/02/23 [phone]

## 2023-03-02 NOTE — Progress Notes (Signed)
Pt receives out-pt HD at [place] on TTS. Will assist as needed.   Melven Sartorius Renal Navigator [phone]

## 2023-03-02 NOTE — H&P (Signed)
NAME:  [name], MRN:  [id number], DOB:  September 12, 1970, LOS: 0 ADMISSION DATE:  03/01/2023 CONSULTATION DATE:  03/02/2023 REFERRING MD:  Pilar Plate - EDP CHIEF COMPLAINT:  Hypertensive emergency, pulmonary edema   History of Present Illness:  52 year old man who presented to [facility] ED 12/16 with CP, SOB. 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 Recent admission 11/3 - 11/9 for NSTEMI/SIRS, found to have staph epidermidis bacteremia/pericarditis, +coxsackie virus; recent antibiotic stop date 12/8.  Patient initially presented to [facility] with complaint of L-sided CP/SOB as well as L flank/abdominal pain x 2 days, worsened significantly 2 hours prior to ED presentation. Feels worse since transition from IV to PO( antibiotics 12/8. Reports last HD was Tuesday, 12/10. Endorses chills, CP/SOB, denies cough. Denies nausea/vomiting/diarrhea. Endorses UOP, denies dysuria.  On ED arrival, patient with low grade temp of 99.84F, tachycardic to 105, hypertensive to 200/92, RR 40, SpO2 94%. Initial concern for sepsis and IV fluids given with subsequent worsening of respiratory status and concern for flash pulmonary edema. NTG gtt started, placed on BiPAP. CXR with progression of patchy multifocal pulmonary infiltrates. CTA Chest/A/P was ordered, given history of dissection (pending). Labs were notable for WBC 7.7, Hgb 10.3, Plt 236. Na 140, K 4.2, CO2 18, BUN/Cr 40/7.85 (baseline ESRD). LFTs WNL. INR 1.2. Trop 55 > 64.  PCCM consulted for ICU admission.  Pertinent Medical History:   Past Medical History:  Diagnosis Date   Adenomatous colon polyp 08/2015   Anxiety    Aortic disease (HCC)    Aortic  dissection (HCC)    a. admx 04/2014 >> L renal infarct; a/c renal failure >> b.  s/p Bioprosthetic Bentall and total arch replacement and staged endovascular repair of descending aortic aneurysm ([facility] - Dr. Kizzie Bane)   CAD (coronary artery disease)    a. LHC 4/16:  oD1 60%   Cardiomyopathy (HCC)    a. non-ischemic - probably related to untreated HTN and ETOH abuse - Echo 3/13 with EF 35-40% >> b. Echo 4/16: Severe LVH, EF 55-60%, moderate AI, moderate MR, mild LAE, trivial effusion, known type B dissection with communication between true and false lumens with suprasternal images suggesting dissection plane may propagate to at least left subclavian takeoff, root above aortic valve okay     Chronic abdominal pain    Chronic combined systolic and diastolic congestive heart failure (HCC)    a. 05/2011: Adm with pulm edema/HTN urgency, EF 35-40% with diffuse hypokinesis and moderate to severe mitral regurgitation. Cardiomyopathy likely due to uncontrolled HTN and ETOH abuse - cath deferred due to renal insufficiency (felt due to uncontrolled HTN). bElzie Rings MV 06/2011: EF 37% and no ischemia or infarction. c. EF 45-50% by echo 01/2012.   Chronic sinusitis    DDD (degenerative disc disease), lumbar    Depression    Descending thoracic aortic aneurysm (HCC)    Dissecting aneurysm of thoracic aorta (HCC)    ESRD (end stage renal disease) (HCC)    on dialysis 1-2 days per week (2 days ordered, only coming 1 day per week as of oct 2024). on dialysis 3-4 years total   ETOH abuse    a. Reported to have quit 05/2011.   Frequent headaches    GERD (  gastroesophageal reflux disease)    Headache(784.0)    "q other day" (08/08/2013)   Hemorrhoid thrombosis    History of echocardiogram    Echo 1/17:  Severe LVH, EF 55-60%, no RWMA, Gr 2 DD, AVR ok, mild to mod MR, mild LAE, mild reduced RVSF, mod RAE   History of medication noncompliance    HYPERLIPIDEMIA    Hypertension    a. Hx of HTN urgency secondary to  noncompliance. b. urinary metanephrine and catecholeamine levels normal 2013.  c. Renal art [name] 1/16:  No evidence of renal artery stenosis noted bilaterally.   INGUINAL HERNIA    Pneumonia ~ 2013   Serrated adenoma of colon 08/2015   Stroke [facility])    Tobacco abuse    Valvular heart disease    a. Echo 05/2011: moderate to severe eccentric MR and mild to moderate AI with prolapsing left coronary cusp. b. Echo 01/2012: mild-mod AI, mild dilitation of aortic root, mild MR.;  c. Echo 1/16: Severe LVH consistent with hypertrophic cardio myopathy, EF 50%, no RWMA, mod AI, mild MR, mild RAE, dilated Ao root (40 mm);    Significant Hospital Events: Including procedures, antibiotic start and stop dates in addition to other pertinent events   12/17 - Presented to [facility] ED for CP, SOB. Hypertensive to 200s, flash pulm edema. NTG gtt/BiPAP. PCCM consulted.  Interim History / Subjective:  PCCM consulted for ICU admission.  Objective:  Blood pressure (!) 196/109, pulse (!) 112, temperature 98 F (36.7 C), temperature source Axillary, resp. rate (!) 35, height 5\' 11"  (1.803 m), weight 65.3 kg, SpO2 95%.    FiO2 (%):  [100 %] 100 % PEEP:  [6 cmH20] 6 cmH20  No intake or output data in the 24 hours ending 03/02/23 0412 Filed Weights   03/01/23 2337  Weight: 65.3 kg   Physical Examination: General: Acutely ill-appearing middle-aged man in mild respiratory distress. Appears uncomfortable. HEENT: [place]/AT, anicteric sclera, PERRL, moist mucous membranes. BiPAP mask in place. Neuro: Awake, oriented x 4. Responds to verbal stimuli. Following commands consistently. Moves all 4 extremities spontaneously. Strength 5/5 in all 4 extremities.  CV: Mildly tachycardic to low 100s, +loud click s/p AV repair. PULM: Breathing tachypneic and moderately labored on BiPAP, some accessory muscle use. Lung fields with coarse crackles throughout. GI: Soft, nontender, nondistended. Normoactive bowel sounds. Extremities: No LE  edema noted. Skin: Warm/dry, no rashes.  Resolved Hospital Problem List:    Assessment & Plan:  Hypertensive crisis History of type B aortic dissection s/p Bentall procedure History of HTN Presented to [facility] ED with CP, SOB. - Admit to ICU for further care - NTG gtt titrated to goal SBP/HR - Goal MAP reduction 10-20% in the first hour, additional 5-15% over the next 23 hours. - Target BP <180/<120 mmHg for the first hour and <160/<110 mmHg for the next 23 hours  - Will resume home meds as able - F/u CTA Chest/A/P when able to obtain, r/o dissection  Flash pulmonary edema in the setting of hypertensive crisis - BiPAP continuous, then PRN - Supplemental O2 support for O2 sat > 90% - Pulmonary hygiene - Precedex for goal RASS 0 to -1/to allow for BiPAP toleration - Lasix 20mg  IV x 1 attempted without significant UOP - Needs HD  ESRD on HD Volume overload - Nephro consulted, appreciate recommendations - HD as able for volume removal - Trend BMP - Replete electrolytes as indicated - Monitor I&Os - Avoid nephrotoxic agents as able  CAD NICM CHF ?  AV endocarditis Echo 02/2023 with severe biventricular hypertrophy, EF 55-60%, G1DD, large AV pseudoaneurysm. Now followed at [facility] for possible endocarditis/valve. - Cards consult, previously seen by valve team - F/u CTA as above - Cardiac monitoring - GDMT as tolerated  Recent MSSA bacteremia On IV antibiotics x 4 weeks (end 12/8), transitioned to PO. - Trend WBC, fever curve - F/u Cx data - Continue broad-spectrum antibiotics  Best Practice: (right click and "Reselect all SmartList Selections" daily)   Diet/type: NPO DVT prophylaxis: SCDs GI prophylaxis: PPI Lines: N/A Foley:  N/A Code Status:  full code Last date of multidisciplinary goals of care discussion [Pending]  Labs:  CBC: Recent Labs  Lab 03/02/23 0014  WBC 7.7  NEUTROABS 6.3  HGB 10.3*  HCT 32.3*  MCV 99.1  PLT 236   Basic Metabolic Panel: Recent  Labs  Lab 03/02/23 0206  NA 140  K 4.2  CL 110  CO2 18*  GLUCOSE 88  BUN 40*  CREATININE 7.85*  CALCIUM 8.8*   GFR: Estimated Creatinine Clearance: 10.4 mL/min (A) (by C-G formula based on SCr of 7.85 mg/dL (H)). Recent Labs  Lab 03/02/23 0014 03/02/23 0054 03/02/23 0211  WBC 7.7  --   --   LATICACIDVEN  --  0.5 0.5   Liver Function Tests: Recent Labs  Lab 03/02/23 0206  AST 12*  ALT 6  ALKPHOS 62  BILITOT 0.6  PROT 6.1*  ALBUMIN 2.9*   No results for input(s): "LIPASE", "AMYLASE" in the last 168 hours. No results for input(s): "AMMONIA" in the last 168 hours.  ABG:    Component Value Date/Time   PHART 7.393 02/29/2016 0416   PCO2ART 38.4 02/29/2016 0416   PO2ART 110.0 (H) 02/29/2016 0416   HCO3 23.0 02/29/2016 0416   TCO2 25 01/17/2023 1656   ACIDBASEDEF 1.0 02/29/2016 0416   O2SAT 98.0 02/29/2016 0416    Coagulation Profile: Recent Labs  Lab 03/02/23 0206  INR 1.2   Cardiac Enzymes: No results for input(s): "CKTOTAL", "CKMB", "CKMBINDEX", "TROPONINI" in the last 168 hours.  HbA1C: Hgb A1c MFr Bld  Date/Time Value Ref Range Status  12/20/2022 10:02 AM 5.1 4.8 - 5.6 % Final    Comment:    (NOTE) Pre diabetes:          5.7%-6.4%  Diabetes:              >6.4%  Glycemic control for   <7.0% adults with diabetes   07/23/2020 11:10 AM 5.4 4.8 - 5.6 % Final    Comment:    (NOTE) Pre diabetes:          5.7%-6.4%  Diabetes:              >6.4%  Glycemic control for   <7.0% adults with diabetes    CBG: No results for input(s): "GLUCAP" in the last 168 hours.  Review of Systems:   Review of systems completed with pertinent positives/negatives outlined in above HPI.  Past Medical History:  He,  has a past medical history of Adenomatous colon polyp (08/2015), Anxiety, Aortic disease (HCC), Aortic dissection (HCC), CAD (coronary artery disease), Cardiomyopathy (HCC), Chronic abdominal pain, Chronic combined systolic and diastolic congestive  heart failure (HCC), Chronic sinusitis, DDD (degenerative disc disease), lumbar, Depression, Descending thoracic aortic aneurysm (HCC), Dissecting aneurysm of thoracic aorta (HCC), ESRD (end stage renal disease) (HCC), ETOH abuse, Frequent headaches, GERD (gastroesophageal reflux disease), Headache(784.0), Hemorrhoid thrombosis, History of echocardiogram, History of medication noncompliance, HYPERLIPIDEMIA, Hypertension, INGUINAL HERNIA, Pneumonia (~  2013), Serrated adenoma of colon (08/2015), Stroke [facility]), Tobacco abuse, and Valvular heart disease.   Surgical History:   Past Surgical History:  Procedure Laterality Date   A/V FISTULAGRAM Left 03/07/2021   Procedure: A/V FISTULAGRAM;  Surgeon: Chuck Hint, MD;  Location: [facility] INVASIVE CV LAB;  Service: Cardiovascular;  Laterality: Left;   A/V FISTULAGRAM Left 07/18/2021   Procedure: A/V Fistulagram;  Surgeon: Chuck Hint, MD;  Location: [facility] INVASIVE CV LAB;  Service: Cardiovascular;  Laterality: Left;   A/V FISTULAGRAM Left 12/25/2022   Procedure: A/V Fistulagram;  Surgeon: Leonie Douglas, MD;  Location: [facility] INVASIVE CV LAB;  Service: Cardiovascular;  Laterality: Left;   ANKLE SURGERY Bilateral    Fractures bilaterally   AORTIC VALVE SURGERY  09/2014   AV FISTULA PLACEMENT Left 09/09/2020   Procedure: LEFT ARM ARTERIOVENOUS (AV) FISTULA CREATION;  Surgeon: Leonie Douglas, MD;  Location: [facility] OR;  Service: Vascular;  Laterality: Left;   BASCILIC VEIN TRANSPOSITION Left 10/09/2020   Procedure: LEFT SECOND STAGE BASILIC VEIN TRANSPOSITION;  Surgeon: Leonie Douglas, MD;  Location: [facility] OR;  Service: Vascular;  Laterality: Left;  PERIPHERAL NERVE BLOCK   CORONARY ANGIOGRAPHY N/A 12/06/2017   Procedure: CORONARY ANGIOGRAPHY;  Surgeon: Lyn Records, MD;  Location: [facility] INVASIVE CV LAB;  Service: Cardiovascular;  Laterality: N/A;   CORONARY ANGIOGRAPHY N/A 09/26/2018   Procedure: CORONARY ANGIOGRAPHY;  Surgeon: Yvonne Kendall, MD;   Location: [facility] INVASIVE CV LAB;  Service: Cardiovascular;  Laterality: N/A;   CORONARY STENT INTERVENTION N/A 12/10/2017   Procedure: CORONARY STENT INTERVENTION;  Surgeon: Lennette Bihari, MD;  Location: [facility] INVASIVE CV LAB;  Service: Cardiovascular;  Laterality: N/A;   FOOT FRACTURE SURGERY Bilateral 2004-2010   "got pins in both of them"   HEMORRHOID SURGERY N/A 06/15/2015   Procedure: HEMORRHOIDECTOMY;  Surgeon: Almond Lint, MD;  Location: [facility] OR;  Service: General;  Laterality: N/A;   INGUINAL HERNIA REPAIR Right ~ 1996   INSERTION OF DIALYSIS CATHETER N/A 09/09/2020   Procedure: INSERTION OF TUNNELED DIALYSIS PALINDROME 19cm CATHETER;  Surgeon: Leonie Douglas, MD;  Location: [facility] OR;  Service: Vascular;  Laterality: N/A;   LEFT HEART CATH AND CORONARY ANGIOGRAPHY N/A 02/05/2021   Procedure: LEFT HEART CATH AND CORONARY ANGIOGRAPHY;  Surgeon: Laurey Morale, MD;  Location: [facility] INVASIVE CV LAB;  Service: Cardiovascular;  Laterality: N/A;   LEFT HEART CATH AND CORONARY ANGIOGRAPHY N/A 01/22/2023   Procedure: LEFT HEART CATH AND CORONARY ANGIOGRAPHY;  Surgeon: Swaziland, Peter M, MD;  Location: [facility] INVASIVE CV LAB;  Service: Cardiovascular;  Laterality: N/A;   LEFT HEART CATHETERIZATION WITH CORONARY ANGIOGRAM N/A 06/21/2014   Procedure: LEFT HEART CATHETERIZATION WITH CORONARY ANGIOGRAM;  Surgeon: Laurey Morale, MD;  Location: [facility] CATH LAB;  Service: Cardiovascular;  Laterality: N/A;   LOOP RECORDER INSERTION N/A 06/19/2016   Procedure: Loop Recorder Insertion;  Surgeon: [facility] Salvia, MD;  Location: [facility] INVASIVE CV LAB;  Service: Cardiovascular;  Laterality: N/A;   PERIPHERAL VASCULAR BALLOON ANGIOPLASTY  03/07/2021   Procedure: PERIPHERAL VASCULAR BALLOON ANGIOPLASTY;  Surgeon: Chuck Hint, MD;  Location: [facility] INVASIVE CV LAB;  Service: Cardiovascular;;   PERIPHERAL VASCULAR BALLOON ANGIOPLASTY  07/18/2021   Procedure: PERIPHERAL VASCULAR BALLOON ANGIOPLASTY;  Surgeon: Chuck Hint, MD;   Location: [facility] INVASIVE CV LAB;  Service: Cardiovascular;;   PERIPHERAL VASCULAR INTERVENTION Left 12/25/2022   Procedure: PERIPHERAL VASCULAR INTERVENTION;  Surgeon: Leonie Douglas, MD;  Location: [facility] INVASIVE CV LAB;  Service: Cardiovascular;  Laterality: Left;  stent to brachiocephalic vein   PERIPHERAL VASCULAR ULTRASOUND/IVUS Left 12/25/2022   Procedure: Peripheral Vascular Ultrasound/IVUS;  Surgeon: Leonie Douglas, MD;  Location: [facility] INVASIVE CV LAB;  Service: Cardiovascular;  Laterality: Left;   TEE WITHOUT CARDIOVERSION N/A 03/12/2016   Procedure: TRANSESOPHAGEAL ECHOCARDIOGRAM (TEE);  Surgeon: Thurmon Fair, MD;  Location: [facility] ENDOSCOPY;  Service: Cardiovascular;  Laterality: N/A;   TEE WITHOUT CARDIOVERSION N/A 10/16/2020   Procedure: TRANSESOPHAGEAL ECHOCARDIOGRAM (TEE);  Surgeon: Laurey Morale, MD;  Location: [facility] ENDOSCOPY;  Service: Cardiovascular;  Laterality: N/A;   TRANSESOPHAGEAL ECHOCARDIOGRAM (CATH LAB) N/A 01/13/2023   Procedure: TRANSESOPHAGEAL ECHOCARDIOGRAM;  Surgeon: Quintella Reichert, MD;  Location: [facility] INVASIVE CV LAB;  Service: Cardiovascular;  Laterality: N/A;   UPPER EXTREMITY VENOGRAPHY Right 07/18/2021   Procedure: UPPER EXTREMITY VENOGRAPHY;  Surgeon: Chuck Hint, MD;  Location: [facility] INVASIVE CV LAB;  Service: Cardiovascular;  Laterality: Right;   Social History:   reports that he has been smoking cigarettes. He has a 5.8 pack-year smoking history. He has been exposed to tobacco smoke. He has never used smokeless tobacco. He reports that he does not currently use alcohol after a past usage of about 1.0 standard drink of alcohol per week. He reports current drug use. Drug: Marijuana.   Family History:  His family history includes Colon cancer in his paternal uncle; Diabetes in his maternal aunt; Headache in his sister; Heart attack in his brother; Hypertension in his brother, mother, and another family member; Lung cancer in his maternal uncle; Other in his  sister; Stroke in his maternal aunt and maternal uncle; Thyroid disease in his sister. There is no history of Throat cancer, Pancreatic cancer, Esophageal cancer, Kidney disease, or Liver disease.   Allergies: Allergies  Allergen Reactions   Clonidine Derivatives Nausea And Vomiting and Other (See Comments)    Tablets by mouth = Tolerated  Patches on the skin = Nausea & vomiting after wearing 4-5 days    Imdur [Isosorbide Nitrate] Other (See Comments)    Headaches -- patient instructed to continue taking    Tramadol Other (See Comments)    Headaches    Clonidine Nausea And Vomiting   Home Medications: Prior to Admission medications   Medication Sig Start Date End Date Taking? Authorizing Provider  acetaminophen (TYLENOL) 325 MG tablet Take 2 tablets (650 mg total) by mouth every 4 (four) hours as needed. 01/14/23 01/14/24  Burnadette Pop, MD  albuterol (VENTOLIN HFA) 108 (90 Base) MCG/ACT inhaler Inhale 1 puff into the lungs every 6 (six) hours as needed for wheezing or shortness of breath. 05/24/21   [provider]  amLODipine (NORVASC) 10 MG tablet TAKE 1 TABLET(10 MG) BY MOUTH DAILY 07/15/21   Laurey Morale, MD  aspirin EC 81 MG EC tablet Take 1 tablet (81 mg total) by mouth daily. 12/12/17   [facility], Roe Rutherford, PA  carvedilol (COREG) 12.5 MG tablet Take 1 tablet (12.5 mg total) by mouth 2 (two) times daily. 02/01/23 05/02/23  Jacklynn Ganong, FNP  ceFAZolin (ANCEF) 2-4 GM/100ML-% IVPB Inject 2 g into the vein. Receiving at Dialysis. EOT expected 12/9    [provider]  cephALEXin (KEFLEX) 500 MG capsule Take 2 capsules (1,000 mg total) by mouth daily. Daily at night time, start from 12/9 after completion of IV course. 02/17/23   Odette Fraction, MD  colchicine 0.6 MG tablet Take 0.5 tablets (0.3 mg total) by mouth daily. Patient not taking: Reported on 02/17/2023 01/23/23   [facility]  Rosine Beat, MD  Evolocumab (REPATHA SURECLICK) 140 MG/ML SOAJ Inject 140 mg  into the skin every 14 (fourteen) days. 10/28/22   Hilty, Lisette Abu, MD  hydrALAZINE (APRESOLINE) 100 MG tablet Take 0.5 tablets (50 mg total) by mouth 3 (three) times daily. 01/14/23   Burnadette Pop, MD  HYDROcodone-acetaminophen (NORCO/VICODIN) 5-325 MG tablet Take 1 tablet by mouth every 8 (eight) hours as needed. 01/29/23   [provider]  lidocaine-prilocaine (EMLA) cream Apply 1 application. topically as needed (prior to port being accessed). Use on dialysis days (Tues, Thurs and Sat)    [provider]  meclizine (ANTIVERT) 25 MG tablet Take 1 tablet (25 mg total) by mouth 3 (three) times daily as needed for dizziness. 12/21/22   Vanetta Mulders, MD  Methoxy PEG-Epoetin Beta (MIRCERA IJ) Mircera 01/30/23   [provider]  metoprolol tartrate (LOPRESSOR) 100 MG tablet Take one tablet TWO hours before ct scan Patient not taking: Reported on 02/17/2023 02/03/23   Orbie Pyo, MD  nitroGLYCERIN (NITROSTAT) 0.4 MG SL tablet Place 1 tablet (0.4 mg total) under the tongue every 5 (five) minutes as needed for chest pain. 01/14/23   Burnadette Pop, MD  oxyCODONE-acetaminophen (PERCOCET) 5-325 MG tablet Take 1 tablet by mouth every 4 (four) hours as needed for severe pain. 12/25/22 12/25/23  Leonie Douglas, MD  pantoprazole (PROTONIX) 40 MG tablet Take 1 tablet (40 mg total) by mouth daily. 01/23/23   Marinda Elk, MD  polyethylene glycol powder (GLYCOLAX/MIRALAX) 17 GM/SCOOP powder Take 17 g by mouth daily. 01/14/23   Burnadette Pop, MD  predniSONE (DELTASONE) 5 MG tablet Take 3 tablets (15 mg total) by mouth daily with breakfast. Take 15 mg daily for 3 weeks then take 10 mg for a week then 5 mg for a week then stop 01/23/23   Marinda Elk, MD  senna-docusate (SENOKOT-S) 8.6-50 MG tablet Take 2 tablets by mouth 2 (two) times daily. 01/14/23   Burnadette Pop, MD  STIOLTO RESPIMAT 2.5-2.5 MCG/ACT AERS Inhale 2 puffs into the lungs at bedtime. 12/29/22    [provider]    Critical care time:   The patient is critically ill with multiple organ system failure and requires high complexity decision making for assessment and support, frequent evaluation and titration of therapies, advanced monitoring, review of radiographic studies and interpretation of complex data.   Critical Care Time devoted to patient care services, exclusive of separately billable procedures, described in this note is 38 minutes.  Tim Lair, PA-C [facility] 03/02/23 4:12 AM  Please see [email or web address] for pager details.  From 7A-7P if no response, please call [phone] After hours, please call ELink [phone]

## 2023-03-02 NOTE — Progress Notes (Signed)
Patient admitted w/ hypertensive crisis. On nitrogylcerin drip. Last HD 12/10. Flash pulmonary edema on Bipap. Started on precedex for bipap compliance.  Plan: -nephro consulted; plan for HD today -will try to wean off bipap to [place] later today -resume home or bp meds -on stiolto inhaler; yupelri/brovana scheduled; duoneb prn  JD Daryel [facility] 03/02/2023, 10:17 AM  Please see [email or web address] for pager details.  From 7A-7P if no response, please call [phone]. After hours, please call ELink [phone].

## 2023-03-02 NOTE — Progress Notes (Signed)
RT assisted with transport of this pt from ED25 to 2H15 while on BiPAP (V60). Pt tolerated well with SVS. 2H RT given report.

## 2023-03-02 NOTE — Procedures (Signed)
I have reviewed the HD regimen and made appropriate changes.  Vinson Moselle MD  [facility] 03/02/2023, 3:20 PM

## 2023-03-02 NOTE — Sepsis Progress Note (Signed)
Following for sepsis monitoring ?

## 2023-03-02 NOTE — ED Notes (Signed)
ED Provider at bedside. 

## 2023-03-02 NOTE — Progress Notes (Signed)
Pharmacy Antibiotic Note  [name] is a 52 y.o. male with possible sepsis.  Pharmacy has been consulted for vancomycin dosing (he is also on rocephin). He is noted with ESRD and recent staph epidermidis bacteremia/pericarditis and antibiotics were stopped 12/8   -vancomycin 1500mg  IV x1 given at 1am -plans for HD today (usual schedule is TTS)  Plan: -vancomycin 750 IV with HD TTS -Will follow cultures and patient progress   Height: 5\' 11"  (180.3 cm) Weight: 68.1 kg (150 lb 2.1 oz) IBW/kg (Calculated) : 75.3  Temp (24hrs), Avg:98.4 F (36.9 C), Min:97.8 F (36.6 C), Max:99.9 F (37.7 C)  Recent Labs  Lab 03/02/23 0014 03/02/23 0054 03/02/23 0206 03/02/23 0211 03/02/23 0552  WBC 7.7  --   --   --  11.0*  CREATININE  --   --  7.85*  --  8.26*  LATICACIDVEN  --  0.5  --  0.5  --     Estimated Creatinine Clearance: 10.3 mL/min (A) (by C-G formula based on SCr of 8.26 mg/dL (H)).    Allergies  Allergen Reactions   Clonidine Derivatives Nausea And Vomiting and Other (See Comments)    Tablets by mouth = Tolerated  Patches on the skin = Nausea & vomiting after wearing 4-5 days    Imdur [Isosorbide Nitrate] Other (See Comments)    Headaches -- patient instructed to continue taking    Tramadol Other (See Comments)    Headaches    Clonidine Nausea And Vomiting    Antimicrobials this admission: 12/17 rocephin>> 12/17 vanc>>  Dose adjustments this admission:   Microbiology results: 12/17 MRSA PCR- neg 12/17 blood x2  Thank you for allowing pharmacy to be a part of this patient's care.  Harland German, PharmD Clinical Pharmacist **Pharmacist phone directory can now be found on [email or web address] ([email or web address]).  Listed under [facility].

## 2023-03-02 NOTE — Progress Notes (Signed)
Heart Failure Navigator Progress Note  Assessed for Heart & Vascular TOC clinic readiness.  Patient does not meet criteria due to Advanced Heart Failure Team patient of Dr. McLean.   Navigator will sign off at this time.   Mahreen Schewe, BSN, RN Heart Failure Nurse Navigator Secure Chat Only   

## 2023-03-02 NOTE — Progress Notes (Signed)
ED Pharmacy Antibiotic Sign Off An antibiotic consult was received from an ED provider for vancomycin per pharmacy dosing for endocarditis. A chart review was completed to assess appropriateness.   The following one time order(s) were placed:  Vancomycin 1500mg    Further antibiotic and/or antibiotic pharmacy consults should be ordered by the admitting provider if indicated.   Thank you for allowing pharmacy to be a part of this patient's care.   Marja Kays, [facility]  Clinical Pharmacist 03/02/23 12:22 AM

## 2023-03-02 NOTE — Consult Note (Signed)
Renal Service Consult Note [facility]  [name] 03/02/2023 Andre Krabbe, MD Requesting Physician: Dr. Adaline Sill  Reason for Consult: ESRD pt w/ chest pain HPI: The patient is a 52 y.o. year-old w/ PMH as below who presented to ED late last night w/ c/o chest pain. Last HD was 12/10. In ED pt had SIRS appearance w/ HR 105, RR 40, temp 99.9 and rec'd IVF"s. Then became SOB and concern for flash pulm edema. Pt placed on Bipap and IV ntg started. CXR showed progression of mf bilat infiltrates. CTA chest/ A/ P was ordered due to hx of dissection. Creat 7, BUN 40, K 4.2. Pt admitted to ICU. We are asked to see for dialysis.   Pt seen in ICU. Pt is on bipap, and precedex gtt. Not answering questions but is following commands. No hx obtained.   Recent admits: - admit Oct 2024  - w/ staph epi bacteremia; TTE noted severe AV stenosis. TEE noted no vegetation. MR spine w/o infection. Plan per ID was 6 wks of IV abx thru 12/08 - 11/3- 11/924 - admit for L pleuritic CP and back pain (hx of bio- prosthetic Bentall and total arch replacement and staged endovascular repair of descending aortic aneurysm in 2016). Serologies for coxsackie virus were +. Blood cx's were negative. Symptomatic rx of viral pericarditis. He was kept on the IV ancef course from the October admit. Seen by cardiology as well.    ROS - n/a  Past Medical History  Past Medical History:  Diagnosis Date   Adenomatous colon polyp 08/2015   Anxiety    Aortic disease (HCC)    Aortic dissection (HCC)    a. admx 04/2014 >> L renal infarct; a/c renal failure >> b.  s/p Bioprosthetic Bentall and total arch replacement and staged endovascular repair of descending aortic aneurysm ([facility] - Dr. Kizzie Bane)   CAD (coronary artery disease)    a. LHC 4/16:  oD1 60%   Cardiomyopathy (HCC)    a. non-ischemic - probably related to untreated HTN and ETOH abuse - Echo 3/13 with EF 35-40% >> b. Echo 4/16: Severe LVH, EF 55-60%,  moderate AI, moderate MR, mild LAE, trivial effusion, known type B dissection with communication between true and false lumens with suprasternal images suggesting dissection plane may propagate to at least left subclavian takeoff, root above aortic valve okay     Chronic abdominal pain    Chronic combined systolic and diastolic congestive heart failure (HCC)    a. 05/2011: Adm with pulm edema/HTN urgency, EF 35-40% with diffuse hypokinesis and moderate to severe mitral regurgitation. Cardiomyopathy likely due to uncontrolled HTN and ETOH abuse - cath deferred due to renal insufficiency (felt due to uncontrolled HTN). bElzie Rings MV 06/2011: EF 37% and no ischemia or infarction. c. EF 45-50% by echo 01/2012.   Chronic sinusitis    DDD (degenerative disc disease), lumbar    Depression    Descending thoracic aortic aneurysm (HCC)    Dissecting aneurysm of thoracic aorta (HCC)    ESRD (end stage renal disease) (HCC)    on dialysis 1-2 days per week (2 days ordered, only coming 1 day per week as of oct 2024). on dialysis 3-4 years total   ETOH abuse    a. Reported to have quit 05/2011.   Frequent headaches    GERD (gastroesophageal reflux disease)    Headache(784.0)    "q other day" (08/08/2013)   Hemorrhoid thrombosis    History of echocardiogram  Echo 1/17:  Severe LVH, EF 55-60%, no RWMA, Gr 2 DD, AVR ok, mild to mod MR, mild LAE, mild reduced RVSF, mod RAE   History of medication noncompliance    HYPERLIPIDEMIA    Hypertension    a. Hx of HTN urgency secondary to noncompliance. b. urinary metanephrine and catecholeamine levels normal 2013.  c. Renal art [name] 1/16:  No evidence of renal artery stenosis noted bilaterally.   INGUINAL HERNIA    Pneumonia ~ 2013   Serrated adenoma of colon 08/2015   Stroke [facility])    Tobacco abuse    Valvular heart disease    a. Echo 05/2011: moderate to severe eccentric MR and mild to moderate AI with prolapsing left coronary cusp. b. Echo 01/2012: mild-mod AI, mild  dilitation of aortic root, mild MR.;  c. Echo 1/16: Severe LVH consistent with hypertrophic cardio myopathy, EF 50%, no RWMA, mod AI, mild MR, mild RAE, dilated Ao root (40 mm);    Past Surgical History  Past Surgical History:  Procedure Laterality Date   A/V FISTULAGRAM Left 03/07/2021   Procedure: A/V FISTULAGRAM;  Surgeon: Chuck Hint, MD;  Location: [facility] INVASIVE CV LAB;  Service: Cardiovascular;  Laterality: Left;   A/V FISTULAGRAM Left 07/18/2021   Procedure: A/V Fistulagram;  Surgeon: Chuck Hint, MD;  Location: [facility] INVASIVE CV LAB;  Service: Cardiovascular;  Laterality: Left;   A/V FISTULAGRAM Left 12/25/2022   Procedure: A/V Fistulagram;  Surgeon: Leonie Douglas, MD;  Location: [facility] INVASIVE CV LAB;  Service: Cardiovascular;  Laterality: Left;   ANKLE SURGERY Bilateral    Fractures bilaterally   AORTIC VALVE SURGERY  09/2014   AV FISTULA PLACEMENT Left 09/09/2020   Procedure: LEFT ARM ARTERIOVENOUS (AV) FISTULA CREATION;  Surgeon: Leonie Douglas, MD;  Location: [facility] OR;  Service: Vascular;  Laterality: Left;   BASCILIC VEIN TRANSPOSITION Left 10/09/2020   Procedure: LEFT SECOND STAGE BASILIC VEIN TRANSPOSITION;  Surgeon: Leonie Douglas, MD;  Location: [facility] OR;  Service: Vascular;  Laterality: Left;  PERIPHERAL NERVE BLOCK   CORONARY ANGIOGRAPHY N/A 12/06/2017   Procedure: CORONARY ANGIOGRAPHY;  Surgeon: Lyn Records, MD;  Location: [facility] INVASIVE CV LAB;  Service: Cardiovascular;  Laterality: N/A;   CORONARY ANGIOGRAPHY N/A 09/26/2018   Procedure: CORONARY ANGIOGRAPHY;  Surgeon: Yvonne Kendall, MD;  Location: [facility] INVASIVE CV LAB;  Service: Cardiovascular;  Laterality: N/A;   CORONARY STENT INTERVENTION N/A 12/10/2017   Procedure: CORONARY STENT INTERVENTION;  Surgeon: Lennette Bihari, MD;  Location: [facility] INVASIVE CV LAB;  Service: Cardiovascular;  Laterality: N/A;   FOOT FRACTURE SURGERY Bilateral 2004-2010   "got pins in both of them"   HEMORRHOID SURGERY N/A 06/15/2015    Procedure: HEMORRHOIDECTOMY;  Surgeon: Almond Lint, MD;  Location: [facility] OR;  Service: General;  Laterality: N/A;   INGUINAL HERNIA REPAIR Right ~ 1996   INSERTION OF DIALYSIS CATHETER N/A 09/09/2020   Procedure: INSERTION OF TUNNELED DIALYSIS PALINDROME 19cm CATHETER;  Surgeon: Leonie Douglas, MD;  Location: [facility] OR;  Service: Vascular;  Laterality: N/A;   LEFT HEART CATH AND CORONARY ANGIOGRAPHY N/A 02/05/2021   Procedure: LEFT HEART CATH AND CORONARY ANGIOGRAPHY;  Surgeon: Laurey Morale, MD;  Location: [facility] INVASIVE CV LAB;  Service: Cardiovascular;  Laterality: N/A;   LEFT HEART CATH AND CORONARY ANGIOGRAPHY N/A 01/22/2023   Procedure: LEFT HEART CATH AND CORONARY ANGIOGRAPHY;  Surgeon: [place], Peter M, MD;  Location: [facility] INVASIVE CV LAB;  Service: Cardiovascular;  Laterality: N/A;   LEFT HEART  CATHETERIZATION WITH CORONARY ANGIOGRAM N/A 06/21/2014   Procedure: LEFT HEART CATHETERIZATION WITH CORONARY ANGIOGRAM;  Surgeon: Laurey Morale, MD;  Location: [facility] CATH LAB;  Service: Cardiovascular;  Laterality: N/A;   LOOP RECORDER INSERTION N/A 06/19/2016   Procedure: Loop Recorder Insertion;  Surgeon: [facility] Salvia, MD;  Location: [facility] INVASIVE CV LAB;  Service: Cardiovascular;  Laterality: N/A;   PERIPHERAL VASCULAR BALLOON ANGIOPLASTY  03/07/2021   Procedure: PERIPHERAL VASCULAR BALLOON ANGIOPLASTY;  Surgeon: Chuck Hint, MD;  Location: [facility] INVASIVE CV LAB;  Service: Cardiovascular;;   PERIPHERAL VASCULAR BALLOON ANGIOPLASTY  07/18/2021   Procedure: PERIPHERAL VASCULAR BALLOON ANGIOPLASTY;  Surgeon: Chuck Hint, MD;  Location: [facility] INVASIVE CV LAB;  Service: Cardiovascular;;   PERIPHERAL VASCULAR INTERVENTION Left 12/25/2022   Procedure: PERIPHERAL VASCULAR INTERVENTION;  Surgeon: Leonie Douglas, MD;  Location: [facility] INVASIVE CV LAB;  Service: Cardiovascular;  Laterality: Left;  stent to brachiocephalic vein   PERIPHERAL VASCULAR ULTRASOUND/IVUS Left 12/25/2022   Procedure: Peripheral  Vascular Ultrasound/IVUS;  Surgeon: Leonie Douglas, MD;  Location: [facility] INVASIVE CV LAB;  Service: Cardiovascular;  Laterality: Left;   TEE WITHOUT CARDIOVERSION N/A 03/12/2016   Procedure: TRANSESOPHAGEAL ECHOCARDIOGRAM (TEE);  Surgeon: Thurmon Fair, MD;  Location: [facility] ENDOSCOPY;  Service: Cardiovascular;  Laterality: N/A;   TEE WITHOUT CARDIOVERSION N/A 10/16/2020   Procedure: TRANSESOPHAGEAL ECHOCARDIOGRAM (TEE);  Surgeon: Laurey Morale, MD;  Location: [facility] ENDOSCOPY;  Service: Cardiovascular;  Laterality: N/A;   TRANSESOPHAGEAL ECHOCARDIOGRAM (CATH LAB) N/A 01/13/2023   Procedure: TRANSESOPHAGEAL ECHOCARDIOGRAM;  Surgeon: Quintella Reichert, MD;  Location: [facility] INVASIVE CV LAB;  Service: Cardiovascular;  Laterality: N/A;   UPPER EXTREMITY VENOGRAPHY Right 07/18/2021   Procedure: UPPER EXTREMITY VENOGRAPHY;  Surgeon: Chuck Hint, MD;  Location: [facility] INVASIVE CV LAB;  Service: Cardiovascular;  Laterality: Right;   Family History  Family History  Problem Relation Age of Onset   Hypertension Mother    Hypertension Other    Colon cancer Paternal Uncle    Stroke Maternal Aunt    Heart attack Brother    Hypertension Brother    Diabetes Maternal Aunt    Lung cancer Maternal Uncle    Stroke Maternal Uncle    Other Sister        "breathing machine at night"   Headache Sister    Thyroid disease Sister    Throat cancer Neg Hx    Pancreatic cancer Neg Hx    Esophageal cancer Neg Hx    Kidney disease Neg Hx    Liver disease Neg Hx    Social History  reports that he has been smoking cigarettes. He has a 5.8 pack-year smoking history. He has been exposed to tobacco smoke. He has never used smokeless tobacco. He reports that he does not currently use alcohol after a past usage of about 1.0 standard drink of alcohol per week. He reports current drug use. Drug: Marijuana. Allergies  Allergies  Allergen Reactions   Clonidine Derivatives Nausea And Vomiting and Other (See Comments)    Tablets  by mouth = Tolerated  Patches on the skin = Nausea & vomiting after wearing 4-5 days    Imdur [Isosorbide Nitrate] Other (See Comments)    Headaches -- patient instructed to continue taking    Tramadol Other (See Comments)    Headaches    Clonidine Nausea And Vomiting   Home medications Prior to Admission medications   Medication Sig Start Date End Date Taking? Authorizing Provider  acetaminophen (TYLENOL) 500 MG tablet Take  1,000 mg by mouth every 6 (six) hours as needed for mild pain (pain score 1-3), moderate pain (pain score 4-6) or headache.   Yes [provider]  albuterol (VENTOLIN HFA) 108 (90 Base) MCG/ACT inhaler Inhale 1 puff into the lungs every 6 (six) hours as needed for wheezing or shortness of breath. 05/24/21  Yes [provider]  amLODipine (NORVASC) 10 MG tablet TAKE 1 TABLET(10 MG) BY MOUTH DAILY 07/15/21  Yes Laurey Morale, MD  aspirin EC 81 MG EC tablet Take 1 tablet (81 mg total) by mouth daily. 12/12/17  Yes [facility], Roe Rutherford, PA  carvedilol (COREG) 12.5 MG tablet Take 1 tablet (12.5 mg total) by mouth 2 (two) times daily. 02/01/23 05/02/23 Yes Milford, Anderson Malta, FNP  cephALEXin (KEFLEX) 500 MG capsule Take 2 capsules (1,000 mg total) by mouth daily. Daily at night time, start from 12/9 after completion of IV course. Patient taking differently: Take 1,000 mg by mouth at bedtime. 02/17/23  Yes Manandhar, Rozell Searing, MD  Evolocumab (REPATHA SURECLICK) 140 MG/ML SOAJ Inject 140 mg into the skin every 14 (fourteen) days. 10/28/22  Yes Hilty, Lisette Abu, MD  hydrALAZINE (APRESOLINE) 100 MG tablet Take 0.5 tablets (50 mg total) by mouth 3 (three) times daily. 01/14/23  Yes Burnadette Pop, MD  HYDROcodone-acetaminophen (NORCO/VICODIN) 5-325 MG tablet Take 1 tablet by mouth every 8 (eight) hours as needed. 01/29/23  Yes [provider]  lidocaine 4 % Place 1 patch onto the skin daily. Apply to lower back   Yes [provider]   lidocaine-prilocaine (EMLA) cream Apply 1 application. topically as needed (prior to port being accessed). Use on dialysis days (Tues, Thurs and Sat)   Yes [provider]  meclizine (ANTIVERT) 25 MG tablet Take 1 tablet (25 mg total) by mouth 3 (three) times daily as needed for dizziness. 12/21/22  Yes Vanetta Mulders, MD  nitroGLYCERIN (NITROSTAT) 0.4 MG SL tablet Place 1 tablet (0.4 mg total) under the tongue every 5 (five) minutes as needed for chest pain. 01/14/23  Yes Burnadette Pop, MD  oxyCODONE-acetaminophen (PERCOCET) 5-325 MG tablet Take 1 tablet by mouth every 4 (four) hours as needed for severe pain. 12/25/22 12/25/23 Yes Leonie Douglas, MD  pantoprazole (PROTONIX) 40 MG tablet Take 1 tablet (40 mg total) by mouth daily. 01/23/23  Yes Marinda Elk, MD  polyethylene glycol powder [facility]) 17 GM/SCOOP powder Take 17 g by mouth daily. 01/14/23  Yes Burnadette Pop, MD  STIOLTO RESPIMAT 2.5-2.5 MCG/ACT AERS Inhale 2 puffs into the lungs at bedtime. 12/29/22  Yes [provider]  predniSONE (DELTASONE) 5 MG tablet Take 3 tablets (15 mg total) by mouth daily with breakfast. Take 15 mg daily for 3 weeks then take 10 mg for a week then 5 mg for a week then stop Patient not taking: Reported on 03/02/2023 01/23/23   Marinda Elk, MD  senna-docusate (SENOKOT-S) 8.6-50 MG tablet Take 2 tablets by mouth 2 (two) times daily. Patient not taking: Reported on 03/02/2023 01/14/23   Burnadette Pop, MD     Vitals:   03/02/23 0845 03/02/23 0900 03/02/23 0915 03/02/23 0930  BP: 121/72 112/69 111/65 107/71  Pulse: 70 67 65 64  Resp: 19 (!) 24 (!) 25 (!) 21  Temp: 98.4 F (36.9 C)     TempSrc: Axillary     SpO2: 96% 97% 98% 94%  Weight:      Height:       Exam Gen sedated, on bipap, sleeping, not  in distress No rash, cyanosis or gangrene Sclera anicteric, throat clear  No jvd or bruits Chest clear bilat to bases, no rales/ wheezing RRR no MRG Abd  soft ntnd no mass or ascites +bs GU nl male MS no joint effusions or deformity Ext no LE or UE edema, no wounds or ulcers Neuro is sedated, follows commands   LUA AVF+bruit       Renal-related home meds: - norvasc 10, coreg 12.5 bid, hydralazine 50 bid    [facility] HD: [facility]  4h   500/800   63.4kg  2/2 bath  AVF   Heparin 3000 - mircera 100 q2, last 12/10, due 12/24    Na 138  K 5.6  CO2 19  BUN 41  creat 8.2   phos 5.6  alb 3.0   BNP > 4500, wbc 11  Hb 10   Assessment/ Plan: Hypertensive crisis - rx'd w/ IV ntg and BP much better Pulm edema - per CXR's x 2 this am. Stabilized w/ bipap. Plan HD shortly, should be ~ 12- 1pm. Lower vol w/ max uf.  ESKD - on HD TTS. Last HD was Thursday. Doesn't go to all his HD sessions. HD as above.  Volume - not grossly overloaded. 4 kg up by wts. 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 Follow.  H/o ascending aortic dissection s/p bentall procedure with AVR and arch repair (bioprosthetic 2016 at [facility]), with staged endovascular repair of descending aortic aneurysm.  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 Being f/b cardiology outpt for this.  Staph epi bacteremia -Oct 2024, completed 6 wks IV ancef on 12/08 Pericarditis - coxsackie + serologies, Nov 2024, rx'd symptomatically    Vinson Moselle  MD [facility] 03/02/2023, 9:40 AM  Recent Labs  Lab 03/02/23 0014 03/02/23 0206 03/02/23 0552  HGB 10.3*  --  10.0*  ALBUMIN  --  2.9* 3.0*  CALCIUM  --  8.8* 8.7*  PHOS  --   --  5.6*  5.6*  CREATININE  --  7.85* 8.26*  K  --  4.2 5.6*   Inpatient medications:  Chlorhexidine Gluconate Cloth  6 each Topical Q0600   heparin  5,000 Units Subcutaneous Q8H    dexmedetomidine (PRECEDEX) IV infusion 1 mcg/kg/hr (03/02/23 0900)   nitroGLYCERIN 100 mcg/min (03/02/23 0900)   acetaminophen, docusate sodium, ondansetron (ZOFRAN) IV, polyethylene glycol

## 2023-03-02 NOTE — ED Notes (Signed)
Per MD Bero, patient is unstable for CT at this time.

## 2023-03-03 ENCOUNTER — Other Ambulatory Visit: Payer: Self-pay

## 2023-03-03 DIAGNOSIS — I161 Hypertensive emergency: Secondary | ICD-10-CM | POA: Diagnosis not present

## 2023-03-03 LAB — HEPATITIS B SURFACE ANTIBODY, QUANTITATIVE: Hep B S AB Quant (Post): <3.5 m[IU]/mL — ABNORMAL LOW

## 2023-03-03 MED ORDER — CHLORHEXIDINE GLUCONATE CLOTH 2 % EX PADS: 6.0000 | MEDICATED_PAD | Freq: Every day | CUTANEOUS | Status: DC

## 2023-03-03 MED ORDER — POLYETHYLENE GLYCOL 3350 17 GM/SCOOP PO POWD: 17.0000 g | Freq: Every day | ORAL | Status: DC

## 2023-03-03 MED ORDER — CHLORHEXIDINE GLUCONATE CLOTH 2 % EX PADS
6.0000 | MEDICATED_PAD | Freq: Every day | CUTANEOUS | Status: DC
  Administered 2023-03-04: 6 via TOPICAL

## 2023-03-03 MED ORDER — LIDOCAINE 4 % EX PTCH: 1.0000 | MEDICATED_PATCH | CUTANEOUS | Status: DC

## 2023-03-03 MED ORDER — LIDOCAINE 5 % EX PTCH
1.0000 | MEDICATED_PATCH | CUTANEOUS | Status: DC
Start: 2023-03-03 — End: 2023-03-04
  Administered 2023-03-03: 1 via TRANSDERMAL
  Filled 2023-03-03: qty 1

## 2023-03-03 MED ORDER — MECLIZINE HCL 25 MG PO TABS: 25.0000 mg | ORAL_TABLET | Freq: Three times a day (TID) | ORAL | Status: DC | PRN

## 2023-03-03 MED ORDER — CEPHALEXIN 500 MG PO CAPS
1000.0000 mg | ORAL_CAPSULE | Freq: Every day | ORAL | Status: DC
  Administered 2023-03-03: 1000 mg via ORAL
  Filled 2023-03-03 (×2): qty 2

## 2023-03-03 MED ORDER — PANTOPRAZOLE SODIUM 40 MG PO TBEC
40.0000 mg | DELAYED_RELEASE_TABLET | Freq: Every day | ORAL | Status: DC
  Administered 2023-03-04: 40 mg via ORAL
  Filled 2023-03-03: qty 1

## 2023-03-03 MED ORDER — UMECLIDINIUM BROMIDE 62.5 MCG/ACT IN AEPB
1.0000 | INHALATION_SPRAY | Freq: Every day | RESPIRATORY_TRACT | Status: DC
  Filled 2023-03-03: qty 7

## 2023-03-03 MED ORDER — ASPIRIN 81 MG PO TBEC
81.0000 mg | DELAYED_RELEASE_TABLET | Freq: Every day | ORAL | Status: DC
  Administered 2023-03-03 – 2023-03-04 (×2): 81 mg via ORAL
  Filled 2023-03-03 (×2): qty 1

## 2023-03-03 MED ORDER — ARFORMOTEROL TARTRATE 15 MCG/2ML IN NEBU
15.0000 ug | INHALATION_SOLUTION | Freq: Two times a day (BID) | RESPIRATORY_TRACT | Status: DC
  Administered 2023-03-03: 15 ug via RESPIRATORY_TRACT
  Filled 2023-03-03: qty 2

## 2023-03-03 MED ORDER — ENSURE ENLIVE PO LIQD
237.0000 mL | Freq: Two times a day (BID) | ORAL | Status: DC
  Administered 2023-03-03 – 2023-03-04 (×3): 237 mL via ORAL

## 2023-03-03 NOTE — Progress Notes (Signed)
[facility] Progress Note  Subjective: seen in room, SOB better  Vitals:   03/03/23 0800 03/03/23 0900 03/03/23 1000 03/03/23 1100  BP: 139/75 127/68 118/65 122/65  Pulse: 79 81 73 73  Resp:  17 19 20   Temp:      TempSrc:      SpO2: 96% 96% 95% 94%  Weight:      Height:        Exam: Gen alert, 2L [place] O2 No jvd or bruits Chest clear bilat to bases RRR no MRG Abd soft ntnd Ext no LE or UE edema Neuro is sedated, follows commands   LUA AVF+bruit           Renal-related home meds: - norvasc 10, coreg 12.5 bid, hydralazine 50 bid      [facility] HD: [facility]  4h   500/800   63.4kg  2/2 bath  AVF   Heparin 3000 - mircera 100 q2, last 12/10, due 12/24     Na 138  K 5.6  CO2 19  BUN 41  creat 8.2   phos 5.6  alb 3.0   BNP > 4500, wbc 11  Hb 10     Assessment/ Plan: Hypertensive crisis - rx'd w/ IV ntg and BP much better Pulm edema/ vol overload - per CXR's x 2. Stabilized w/ bipap. Had HD in the afternoon w/ 3.5 L off. On 2L [place] O2 now. Still up 5-6kg by wts. Not in distress.  ESKD - on HD TTS. Typically doesn't go to all his HD sessions. Had HD yesterday here w/ 3.5 L off. Plan HD tomorrow am early w/ max UF as tolerated.  Anemia of eskd - Hb 10-11 MBD ckd - CCa and phos are in range. Follow.  H/o ascending aortic dissection s/p bentall procedure with AVR and arch repair (bioprosthetic 2016 at [facility]), with staged endovascular repair of descending aortic aneurysm.  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 Being f/b cardiology outpt for this.  Staph epi bacteremia -Oct 2024, completed 6 wks IV ancef on 12/08 Pericarditis - coxsackie + serologies, Nov 2024, rx'd symptomatically Dispo - pt says he needs to be dc'd to get to his [facility] heart doctor appt tomorrow at 2 pm.        Vinson Moselle MD  [facility] 03/03/2023, 1:06 PM  Recent Labs  Lab 03/02/23 0014 03/02/23 0206 03/02/23 0552  HGB 10.3*  --  10.0*  ALBUMIN  --  2.9* 3.0*   CALCIUM  --  8.8* 8.7*  PHOS  --   --  5.6*  5.6*  CREATININE  --  7.85* 8.26*  K  --  4.2 5.6*   No results for input(s): "IRON", "TIBC", "FERRITIN" in the last 168 hours. Inpatient medications:  amLODipine  10 mg Oral Daily   budesonide (PULMICORT) nebulizer solution  0.5 mg Nebulization BID   carvedilol  12.5 mg Oral BID WC   [START ON 03/04/2023] Chlorhexidine Gluconate Cloth  6 each Topical Daily   feeding supplement  237 mL Oral BID BM   heparin  5,000 Units Subcutaneous Q8H   hydrALAZINE  100 mg Oral TID   revefenacin  175 mcg Nebulization Daily    acetaminophen, docusate sodium, ipratropium-albuterol, ondansetron (ZOFRAN) IV, mouth rinse, oxyCODONE, polyethylene glycol

## 2023-03-03 NOTE — Progress Notes (Signed)
   03/03/23 1919  BiPAP/CPAP/SIPAP  BiPAP/CPAP/SIPAP Pt Type Adult  BiPAP/CPAP/SIPAP V60   Pt has PRN BIPAP orders, no distress noted at this time.

## 2023-03-03 NOTE — TOC CM/SW Note (Signed)
Transition of Care [facility]) - Inpatient Brief Assessment   Patient Details  Name: [name] MRN: [id number] Date of Birth: 03-Nov-1970  [facility] CM/SW Contact:    Gala Lewandowsky, RN Phone Number: 03/03/2023, 2:08 PM   Clinical Narrative: Patient presented for hypertensive emergency Patient was discussed in progression rounds and that he needs to get to an appointment at [facility] 03-04-23; he would like to be discharged before his appointment. Patient has insurance and PCP. No home health needs identified at this time.    Transition of Care Asessment: Insurance and Status: Insurance coverage has been reviewed Patient has primary care physician: Yes Prior/Current Home Services: No current home services Social Drivers of Health Review: SDOH reviewed no interventions necessary Readmission risk has been reviewed: Yes Transition of care needs: no transition of care needs at this time

## 2023-03-03 NOTE — Progress Notes (Addendum)
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  He was last admitted from 11/3-9 for NSTEMI and Staph epi bacteremia with pericarditis, + coxsackie virus.  Initial concern for sepsis as well as flash pulmonary edema, placed on BIPAP.  Ultimately diagnosed with HTN emergency and flash pulmonary edema, with last dialysis last Tuesday. He was placed on bipap -> [place] O2 and started on nitro drip (now off) and started on oral anti-hypertensives. Received dialysis.   Assessment and Plan:  HTN Emergency Patient presenting with severe chest pain and SOB concerning for hypertensive crisis He did have evidence of end organ failure, with acute pulmonary edema requiring BIPAP He was admitted to ICU, started on NTG drip, and underwent HD with improvement in respiratory status He was transferred to progressive care and to the [facility] service He is significantly improved, on [place] O2 at this time Will wean off O2, transfer to telemetry Resumed home carvedilol, amlodipine, and hydralazine  ESRD Patient supposed to on chronic TTS HD, reports only "needing" HD once a week Per HD note on `1/5, he "refuses to come regularly 3 days a week" and he was changed to 2 days a week due to "extreme non-compliance ... But he still misses numerous treatments" Came in with pulmonary edema from missed HD Nephrology is consulting Patient underwent HD on 12/17 Will need HD again tomorrow AM prior to dc   AV endocarditis with recent MSSA bacteremia Echo 02/15/2023 with severe biventricular hypertrophy, EF 55-60%, G1DD, large AV pseudoaneurysm Now followed at [facility] for possible endocarditis/valve. Completed 6 weeks of IV Cefazolin, EOT 12/8 He then started Cephalexin 1  gram PO at bedtime - duration TBD pending plans for AVR Initial concern for infection on admission, started on broad spectrum abx Despite elevated procalcitonin, there is no other evidence of current infection so will resume Cephalexin He is scheduled to f/u with [facility] surgery tomorrow (12/19) at 2pm and reports that he will leave AMA if not discharged soon enough to attend  History of type B aortic dissection s/p Bent[place] procedure/CAD CTA with stable aneurysms Continue ASA  HLD Continue Repatha  COPD Wean off O2, 88-92% is his goal Continue Stiolto, prn albuterol     Consultants: PCCM Nephrology  Procedures: HD 12/17  Antibiotics: Vancomycin 12/17-18 Ceftriaxone 12/17-18 Cephalexin 12/18-   30 Day Unplanned Readmission Risk Score    Flowsheet Row ED to Hosp-Admission (Current) from 03/01/2023 in [facility]H [facility] ICU  30 Day Unplanned Readmission Risk Score (%) 39.61 Filed at 03/03/2023 0401       This score is the patient's risk of an unplanned readmission within 30 days of being discharged (0 -100%). The score is based on dignosis, age, lab data, medications, orders, and past utilization.   Low:  0-14.9   Medium: 15-21.9   High: 22-29.9   Extreme: 30 and above           Subjective: He reports that he was previously on HD 3x/week but "it was only for cleaning my blood" and he didn't need it as often so was changed to twice weekly and then he decided that he only needed it once a week.  He missed his last session but does not believe  this is related to his current admission and does not believe that he needs HD more often despite valvular heart disease.  He has a consult about the recent endocarditis at [facility] tomorrow at 2 and will leave AMA if not discharged in time to get there.   Objective: Vitals:   03/03/23 1200 03/03/23 1300  BP: 125/65   Pulse: 77 82  Resp: (!) 24 17  Temp:    SpO2: 94% 94%    Intake/Output Summary (Last 24 hours) at  03/03/2023 1333 Last data filed at 03/03/2023 0900 Gross per 24 hour  Intake 600.65 ml  Output 3900 ml  Net -3299.35 ml   Filed Weights   03/02/23 1408 03/02/23 1842 03/03/23 0500  Weight: 71.6 kg 68.1 kg 69.9 kg    Exam:  General:  Appears calm and comfortable and is in NAD, on [place] O2 Eyes:  EOMI, normal lids, iris ENT:  grossly normal hearing, lips & tongue, mmm Neck:  no LAD, masses or thyromegaly Cardiovascular:  RRR, very loud murmur with mechanical click. No LE edema.  Respiratory:   CTA bilaterally with no wheezes/rales/rhonchi.  Normal respiratory effort. Abdomen:  soft, NT, ND Skin:  no rash or induration seen on limited exam Musculoskeletal:  grossly normal tone BUE/BLE, good ROM, no bony abnormality Psychiatric:  blunted mood and affect, speech fluent and appropriate, AOx3 Neurologic:  CN 2-12 grossly intact, moves all extremities in coordinated fashion  Data Reviewed: I have reviewed the patient's lab results since admission.  Pertinent labs for today include:   None today     Family Communication: None present  Disposition: Status is: Inpatient Remains inpatient appropriate because: needs another round of HD     Time spent: 50 minutes  Unresulted Labs (From admission, onward)     Start     Ordered   03/04/23 0500  CBC with Differential/Platelet  Tomorrow morning,   R       Question:  Specimen collection method  Answer:  Lab=Lab collect   03/03/23 1310   03/04/23 0500  Basic metabolic panel  Tomorrow morning,   R       Question:  Specimen collection method  Answer:  Lab=Lab collect   03/03/23 1310             Author: Jonah Blue, MD 03/03/2023 1:33 PM  For on call review [email or web address].

## 2023-03-04 DIAGNOSIS — I161 Hypertensive emergency: Secondary | ICD-10-CM | POA: Diagnosis not present

## 2023-03-04 LAB — CBC WITH DIFFERENTIAL/PLATELET
Abs Immature Granulocytes: 0.01 10*3/uL (ref 0.00–0.07)
Basophils Absolute: 0 10*3/uL (ref 0.0–0.1)
Basophils Relative: 1 %
Eosinophils Absolute: 0.2 10*3/uL (ref 0.0–0.5)
Eosinophils Relative: 3 %
HCT: 31.6 % — ABNORMAL LOW (ref 39.0–52.0)
Hemoglobin: 10.1 g/dL — ABNORMAL LOW (ref 13.0–17.0)
Immature Granulocytes: 0 %
Lymphocytes Relative: 18 %
Lymphs Abs: 1 10*3/uL (ref 0.7–4.0)
MCH: 31.4 pg (ref 26.0–34.0)
MCHC: 32 g/dL (ref 30.0–36.0)
MCV: 98.1 fL (ref 80.0–100.0)
Monocytes Absolute: 0.7 10*3/uL (ref 0.1–1.0)
Monocytes Relative: 13 %
Neutro Abs: 3.6 10*3/uL (ref 1.7–7.7)
Neutrophils Relative %: 65 %
Platelets: 96 10*3/uL — ABNORMAL LOW (ref 150–400)
RBC: 3.22 MIL/uL — ABNORMAL LOW (ref 4.22–5.81)
RDW: 14.7 % (ref 11.5–15.5)
WBC: 5.6 10*3/uL (ref 4.0–10.5)
nRBC: 0 % (ref 0.0–0.2)

## 2023-03-04 LAB — BASIC METABOLIC PANEL
Anion gap: 9 (ref 5–15)
BUN: 41 mg/dL — ABNORMAL HIGH (ref 6–20)
CO2: 25 mmol/L (ref 22–32)
Calcium: 9.3 mg/dL (ref 8.9–10.3)
Chloride: 102 mmol/L (ref 98–111)
Creatinine, Ser: 6.71 mg/dL — ABNORMAL HIGH (ref 0.61–1.24)
GFR, Estimated: 9 mL/min — ABNORMAL LOW (ref >60)
Glucose, Bld: 90 mg/dL (ref 70–99)
Potassium: 3.7 mmol/L (ref 3.5–5.1)
Sodium: 136 mmol/L (ref 135–145)

## 2023-03-04 MED ORDER — HEPARIN SODIUM (PORCINE) 1000 UNIT/ML DIALYSIS
3000.0000 [IU] | Freq: Once | INTRAMUSCULAR | Status: AC
  Administered 2023-03-04: 3000 [IU] via INTRAVENOUS_CENTRAL
  Filled 2023-03-04: qty 3

## 2023-03-04 MED ORDER — HEPARIN SODIUM (PORCINE) 1000 UNIT/ML DIALYSIS
1500.0000 [IU] | INTRAMUSCULAR | Status: AC | PRN
Start: 2023-03-05 — End: 2023-03-04
  Administered 2023-03-04: 1500 [IU] via INTRAVENOUS_CENTRAL
  Filled 2023-03-04: qty 2

## 2023-03-04 NOTE — Hospital Course (Signed)
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  He was last admitted from 11/3-9 for NSTEMI and Staph epi bacteremia with pericarditis, + coxsackie virus.  Initial concern for sepsis as well as flash pulmonary edema, placed on BIPAP.  Ultimately diagnosed with HTN emergency and flash pulmonary edema, with last dialysis last Tuesday. He was placed on bipap -> [place] O2 and started on nitro drip (now off) and started on oral anti-hypertensives. Received dialysis x 2.

## 2023-03-04 NOTE — Discharge Summary (Signed)
Physician Discharge Summary   Patient: [name] MRN: [id number] DOB: [birth year]  Admit date:     03/01/2023  Discharge date: 03/04/23  Discharge Physician: Jonah Blue   PCP: Loura Back, NP   Recommendations at discharge:   Follow up at dialysis as scheduled  Continue Keflex/Cephalexin 1000 mg nightly Take blood pressure medications as ordered Follow up with NP Cyndie Chime in 1-2 weeks Follow up with CT surgery at [facility] today at 2pm as scheduled  Discharge Diagnoses: Principal Problem:   Hypertensive emergency - Accelerated Hypertension Active Problems:   Coronary artery disease involving native coronary artery of native heart with angina pectoris (HCC)   Uncontrolled hypertension   S/P aortic dissection repair   Mixed hyperlipidemia   COPD (chronic obstructive pulmonary disease) (HCC)   ESRD on dialysis [facility])   Prosthetic valve endocarditis [facility])    Hospital Course: 52yo with h/o HTN, HLD, CAD, chronic combined CHF, 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  He was last admitted from 11/3-9 for NSTEMI and Staph epi bacteremia with pericarditis, + coxsackie virus.  Initial concern for sepsis as well as flash pulmonary edema, placed on BIPAP.  Ultimately diagnosed with HTN emergency and flash pulmonary edema, with last dialysis last Tuesday. He was placed on bipap -> [facility] O[facility] and started on nitro drip (now off) and started on oral anti-hypertensives. Received dialysis x 2.  Assessment and Plan:  HTN Emergency Patient presenting with severe chest pain and SOB concerning for hypertensive crisis He did have evidence of end organ failure, with acute pulmonary edema requiring BIPAP He was admitted to ICU, started on NTG drip, and underwent HD with improvement in respiratory status He was transferred to progressive care and to the [facility] service He is significantly improved, no longer requiring O2 Resumed home carvedilol, amlodipine,  and hydralazine   ESRD Patient supposed to on chronic TTS HD, reports only "needing" HD once a week Per HD note on 12/5, he "refuses to come regularly 3 days a week" and he was changed to 2 days a week due to "extreme non-compliance ... But he still misses numerous treatments" Came in with pulmonary edema from missed HD Nephrology is consulting Patient underwent HD on 12/17 and 12/19 Needs to become compliant with HD - particularly in the setting of valvular heart disease   AV endocarditis with recent MSSA bacteremia Echo 02/15/2023 with severe biventricular hypertrophy, EF 55-60%, G1DD, large AV pseudoaneurysm Now followed at [facility] for possible endocarditis/valve. 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 and patient was instructed, not to drive, operate heavy machinery, perform activities at heights, swimming or participation in water activities  or provide baby-sitting services while on Pain, Sleep and Anxiety Medications; until their outpatient Physician has advised to do so again. Also recommended to not to take more than prescribed Pain, Sleep and Anxiety Medications.    Disposition: Home Diet recommendation:  Renal diet DISCHARGE MEDICATION: Allergies as of 03/04/2023       Reactions   Clonidine Derivatives Nausea And Vomiting, Other (See Comments)   Tablets by mouth = Tolerated Patches on the skin = Nausea & vomiting after wearing 4-5 days    Imdur [isosorbide Nitrate] Other (See Comments)   Headaches -- patient instructed to continue taking   Tramadol Other (See Comments)   Headaches   Clonidine Nausea And Vomiting        Medication List     TAKE these medications    acetaminophen 500 MG tablet Commonly known as: TYLENOL Take 1,000 mg by mouth every 6 (six) hours as needed for mild pain (pain score 1-3), moderate pain (pain score 4-6) or headache.   albuterol 108 (90 Base) MCG/ACT inhaler Commonly known as: VENTOLIN HFA Inhale 1 puff into the lungs every 6 (six) hours as needed for wheezing or shortness of breath.   amLODipine 10 MG tablet Commonly known as: NORVASC TAKE 1 TABLET(10 MG) BY MOUTH DAILY   aspirin EC 81 MG tablet Take 1 tablet (81 mg total) by mouth daily.   carvedilol 12.5 MG tablet Commonly known as: COREG Take 1 tablet (12.5 mg total) by mouth 2 (two) times daily.   cephALEXin 500 MG capsule Commonly known as: KEFLEX Take 2 capsules (1,000 mg total) by mouth daily. Daily at night time, start from 12/9 after completion of IV course. What changed:  when to take this additional instructions   hydrALAZINE 100 MG tablet Commonly known as: APRESOLINE Take 0.5 tablets (50 mg total) by mouth 3 (three) times daily.   HYDROcodone-acetaminophen 5-325 MG tablet Commonly known as: NORCO/VICODIN Take 1 tablet by mouth every 8 (eight) hours as needed.   lidocaine 4 % Place 1 patch onto the skin daily. Apply to lower back   lidocaine-prilocaine cream Commonly known as: EMLA Apply 1 application. topically as needed (prior to port being accessed). Use on dialysis days (Tues, Thurs and Sat)   meclizine 25 MG  tablet Commonly known as: ANTIVERT Take 1 tablet (25 mg total) by mouth 3 (three) times daily as needed for dizziness.   nitroGLYCERIN 0.4 MG SL tablet Commonly known as: NITROSTAT Place 1 tablet (0.4 mg total) under the tongue every 5 (five) minutes as needed for chest pain.   oxyCODONE-acetaminophen 5-325 MG tablet Commonly known as: Percocet Take 1 tablet by mouth every 4 (four) hours as needed for severe pain.   pantoprazole 40 MG tablet Commonly known as: PROTONIX Take 1 tablet (40 mg total) by mouth daily.   polyethylene glycol powder 17 GM/SCOOP powder Commonly known as: GLYCOLAX/MIRALAX Take 17 g by mouth daily.   Repatha SureClick 140 MG/ML Soaj Generic drug: Evolocumab Inject 140 mg into the skin every 14 (fourteen) days.   Stiolto Respimat 2.5-2.5 MCG/ACT Aers Generic drug: Tiotropium Bromide-Olodaterol Inhale 2 puffs into the lungs at bedtime.        Discharge Exam:   Subjective: No complaints, seen in HD   Objective: Vitals:   03/04/23 1104 03/04/23 1107  BP: 134/84 134/85  Pulse: 77   Resp: 19 18  Temp: (!) 97.3 F (36.3 C)   SpO2: 99% 98%    Intake/Output Summary (Last 24 hours) at 03/04/2023  1224 Last data filed at 03/04/2023 1107 Gross per 24 hour  Intake 237 ml  Output 3600 ml  Net -3363 ml   Filed Weights   03/04/23 0500 03/04/23 0749 03/04/23 1145  Weight: 62.6 kg 65 kg 62.1 kg    Exam:  General:  Appears calm and comfortable and is in NAD, on RA Eyes:  EOMI, normal lids, iris ENT:  grossly normal hearing, lips & tongue, mmm Neck:  no LAD, masses or thyromegaly Cardiovascular:  RRR, very loud murmur with mechanical click. No LE edema.  Respiratory:   CTA bilaterally with no wheezes/rales/rhonchi.  Normal respiratory effort. Abdomen:  soft, NT, ND Skin:  no rash or induration seen on limited exam Musculoskeletal:  grossly normal tone BUE/BLE, good ROM, no bony abnormality Psychiatric:  blunted mood and affect, speech fluent  and appropriate, AOx3 Neurologic:  CN 2-12 grossly intact, moves all extremities in coordinated fashion  Data Reviewed: I have reviewed the patient's lab results since admission.  Pertinent labs for today include:   BUN 41/Creatinine 6.71/GFR 9 WBC 5.6 Hgb 10.1 Platelets 96    Condition at discharge: improving  The results of significant diagnostics from this hospitalization (including imaging, microbiology, ancillary and laboratory) are listed below for reference.   Imaging Studies: CT Angio Chest/Abd/Pel for Dissection W and/or Wo Contrast Result Date: 03/02/2023 CLINICAL DATA:  Chest pain and shortness of breath. Clinical concern for acute aortic syndrome. EXAM: CT ANGIOGRAPHY CHEST, ABDOMEN AND PELVIS TECHNIQUE: Non-contrast CT of the chest was initially obtained. Multidetector CT imaging through the chest, abdomen and pelvis was performed using the standard protocol during bolus administration of intravenous contrast. Multiplanar reconstructed images and MIPs were obtained and reviewed to evaluate the vascular anatomy. RADIATION DOSE REDUCTION: This exam was performed according to the departmental dose-optimization program which includes automated exposure control, adjustment of the mA and/or kV according to patient size and/or use of iterative reconstruction technique. CONTRAST:  75mL OMNIPAQUE IOHEXOL 350 MG/ML SOLN COMPARISON:  Chest, abdomen and pelvis CTA dated 02/10/2023. Portable chest obtained earlier today. FINDINGS: CTA CHEST FINDINGS Cardiovascular: Stable stent graft repair of the ascending aorta, aortic arch and descending aorta without aneurysm or dissection. Stable postsurgical common origin implantation on the proximal ascending thoracic aorta with widely patent vessels. Stable left innominate vein stent. Normally opacified pulmonary arteries with no pulmonary arterial filling defects seen. The central pulmonary arteries remain prominent with a main pulmonary artery diameter  of 4.3 cm. Stable enlarged heart. No pericardial effusion. Atheromatous calcifications, including the coronary arteries and aorta. Mediastinum/Nodes: Stable multiple borderline enlarged mediastinal nodes with no interval enlarged nodes. Unremarkable thyroid gland, trachea and esophagus. Lungs/Pleura: Interval small to moderate-sized right pleural effusion and small left pleural effusion. Increased ground-glass opacities in both lungs with interval scattered patchy densities in both lungs, right greater than left, with some consolidation in the posterior right lower lobe and in the right upper lobe. Mild bilateral centrilobular bullous emphysematous changes. Musculoskeletal: Minimal thoracic spine degenerative changes. Review of the MIP images confirms the above findings. CTA ABDOMEN AND PELVIS FINDINGS VASCULAR Aorta: The descending thoracic aortic stent extends into the upper abdomen. Stable postsurgical changes involving the proximal abdominal aorta and branch vessels. Stable mild aneurysmal dilatation of the distal abdominal aorta measuring 3.1 cm. No interval dissection or aneurysm. The patency of the left difficult to assess due to late phase imaging, and may be occluded. The right renal artery remains patent. Inflow: Stable 3.6 cm aneurysmal dilatation of the proximal left common iliac  artery and 2.1 cm aneurysmal dilatation of the proximal right common iliac artery. Veins: No obvious venous abnormality within the limitations of this arterial phase study. Review of the MIP images confirms the above findings. NON-VASCULAR Hepatobiliary: Stable probable small right lobe liver cyst. Stable 5 mm gallstone in the gallbladder and small amount of pericholecystic fluid. Pancreas: Unremarkable. No pancreatic ductal dilatation or surrounding inflammatory changes. Spleen: Normal in size without focal abnormality. Adrenals/Urinary Tract: Left renal atrophy. Poorly visualized adrenal glands. Unremarkable right kidney,  ureters and urinary bladder. Stomach/Bowel: Unremarkable stomach, small bowel and colon. The appendix is not visualized. No secondary signs of appendicitis. Lymphatic: No enlarged lymph nodes, limited by the lack of peritoneal and retroperitoneal fat. Reproductive: Mildly enlarged prostate gland. Other: Diffuse subcutaneous edema. Musculoskeletal: No acute bony abnormality. Review of the MIP images confirms the above findings. IMPRESSION: 1. No pulmonary emboli. 2. Possible occlusion of the left renal artery. 3. Stable stent graft repair of the ascending aorta, aortic arch and descending aorta without aneurysm or dissection. 4. Stable postsurgical changes involving the proximal abdominal aorta and branch vessels. 5. Stable mild aneurysmal dilatation of the distal abdominal aorta measuring 3.1 cm. 6. No interval abdominal aortic dissection. 7. Stable 3.6 cm aneurysmal dilatation of the proximal left common iliac artery and 2.1 cm aneurysmal dilatation of the proximal right common iliac artery. 8. Stable cardiomegaly and enlarged central pulmonary arteries, consistent with pulmonary arterial hypertension. 9. Interval small to moderate-sized right pleural effusion and small left pleural effusion. 10. Increased ground-glass opacities in both lungs with interval scattered patchy densities in both lungs, right greater than left, with some consolidation in the posterior right lower lobe and in the right upper lobe. This is most consistent with pulmonary edema. Pneumonia is also possible in the right upper lobe and right lower lobe. 11. Mild changes of centrilobular emphysema. 12. Stable cholelithiasis and small amount of pericholecystic fluid. 13. Stable left renal atrophy. 14. Diffuse subcutaneous edema. 15. Mildly enlarged prostate gland. Aortic Atherosclerosis (ICD10-I70.0) and Emphysema (ICD10-J43.9). Electronically Signed   By: Beckie Salts M.D.   On: 03/02/2023 15:35   DG Chest Port 1 View Result Date:  03/02/2023 CLINICAL DATA:  Worsening shortness of breath EXAM: PORTABLE CHEST 1 VIEW COMPARISON:  Earlier today FINDINGS: Interstitial opacity with Kerley lines. Chronic cardiomegal,y there has been aortic valve replacement and aortic stent grafting. The left lung base is obscured. No visible effusion and no pneumothorax. IMPRESSION: Symmetric and increased interstitial opacity with some Kerley lines, likely CHF. Electronically Signed   By: Tiburcio Pea M.D.   On: 03/02/2023 05:39   DG Chest Port 1 View Result Date: 03/02/2023 CLINICAL DATA:  Sepsis EXAM: PORTABLE CHEST 1 VIEW COMPARISON:  01/18/2023 FINDINGS: lungs are symmetrically well expanded. Patchy multifocal pulmonary infiltrates a patter slightly progressive throughout the right lung, suspicious for changes of acute bronchopneumonia. No pneumothorax or pleural effusion. Stable cardiomegaly. Aortic valve replacement has been performed. Stent graft involving the ascending and descending thoracic aorta is again noted and appears unchanged. Vascular stent noted within expected brachiocephalic vein again noted. Implanted loop recorder noted. Pulmonary vascularity is normal. No acute bone IMPRESSION: 1. Slight progression of patchy multifocal pulmonary infiltrates, suspicious for changes of acute bronchopneumonia. Electronically Signed   By: Helyn Numbers M.D.   On: 03/02/2023 01:51   CT CORONARY MORPH W/CTA COR W/SCORE W/CA W/CM &/OR WO/CM Addendum Date: 02/25/2023 ADDENDUM REPORT: 02/25/2023 01:08 EXAM: OVER-READ INTERPRETATION  CT CHEST The following report is an over-read performed by  radiologist Dr. Alcide Clever of [facility] on 02/25/2023. This over-read does not include interpretation of cardiac or coronary anatomy or pathology. The coronary calcium score/coronary CTA interpretation by the cardiologist is attached. COMPARISON:  01/16/2023 FINDINGS: Cardiovascular: Aortic stent graft is again seen and stable. Mediastinum/Nodes:  There are no enlarged lymph nodes within the visualized mediastinum. Lungs/Pleura: There is no pleural effusion. Mild scarring is noted in the bases bilaterally. Upper abdomen: No significant findings in the visualized upper abdomen. Musculoskeletal/Chest wall: No chest wall mass or suspicious osseous findings within the visualized chest. IMPRESSION: No significant extracardiac findings within the visualized chest. Electronically Signed   By: Alcide Clever M.D.   On: 02/25/2023 01:08   Result Date: 02/25/2023 CLINICAL DATA:  Aortic valve replacement evaluation EXAM: Cardiac valve CT TECHNIQUE: The patient was scanned on a Siemens Force 192 slice scanner. A 120 kV retrospective scan was triggered in the descending thoracic aorta at 111 HU's. Gantry rotation speed was 270 msecs and collimation was .9 mm. The 3D data set was reconstructed in 5% intervals of the R-R cycle. Systolic and diastolic phases were analyzed on a dedicated work station using MPR, MIP and VRT modes. The patient received 95mL OMNIPAQUE IOHEXOL 350 MG/ML SOLN of contrast. 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 Prosthetic aortic valve leaflets appear severely thickened and mildly calcified with hypoattenuation at the commissures. The leaflets adjacent to the native left and noncoronary cusps demonstrate restricted motion. Valve findings suggests degenerative prosthetic valve changes. 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 Measurements are approximately 4.3 x 2.8 x 3.4 cm Internal dimensions of valve ring are 26.4 x 26 mm. Due to pseudoaneurysm, valve may not be suitable for transcatheter valve therapies. Coronary arteries are reimplanted and are 18 mm (L) and 20.5 mm (R) above the  valve. They are reimplanted above the length of the leaflets. 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 This appears grossly normal however will be fully evaluated in radiologist interpretation. Atria: Large left atrial appendage with no apparent thrombus. Biatrial dilation. Other: Severely dilated main pulmonary artery. Grossly normal pulmonary vein anatomy. Grossly normal mitral valve with mild thickening. Severe LV wall thickening. IMPRESSION: 1. 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 Measurements are approximately 4.3 x 2.8 x 3.4 cm. The is systolic expansion of the pseudoaneurysm with probable mild compression of the valved conduit in systole though this evaluation is somewhat limited and may be better visualized on echo. Compared to contrast chest CT 01/16/23, this finding appears new. 2. Prosthetic aortic valve leaflets appear severely thickened and mildly calcified with hypoattenuation at the commissures. The leaflets adjacent to the native left and noncoronary cusps demonstrate restricted motion. Valve findings suggests degenerative prosthetic valve changes. Due to pseudoaneurysm location, valve may not be suitable for transcatheter valve therapies. Electronically Signed: By: Weston Brass M.D. On: 02/10/2023 21:57   ECHOCARDIOGRAM COMPLETE Result Date: 02/15/2023    ECHOCARDIOGRAM REPORT   Patient Name:   [name] Date of Exam: 02/15/2023 Medical Rec #:  [id number]     Height:       71.0 in Accession #:  [id number]    Weight:       137.2 lb Date of Birth:  June 21, 1970      BSA:          1.796 m Patient Age:    52 years      BP:           148/80 mmHg Patient Gender: M             HR:           80 bpm. Exam Location:  [address] Procedure: 2D Echo, Cardiac Doppler and Color Doppler Indications:     Z95.2 AVR   History:         Patient has prior history of Echocardiogram examinations, most                  recent 01/10/2023. LVH and Cardiomyopathy, Previous Myocardial                  Infarction, COPD, Stroke and Aortic dissection repair, S/p AVR                  (27mm Mitroflow pericardial valve); Risk Factors:Alcohol abuse,                  Hypertension, Dyslipidemia and Current Smoker.  Sonographer:     Samule Ohm RDCS Referring Phys:  [id number] [facility] Diagnosing Phys: Clearnce Hasten IMPRESSIONS  1. Severe biventricular hypertrophy, reasonable to rule out amyloid. Left ventricular ejection fraction, by estimation, is 55 to 60%. The left ventricle has normal function. The left ventricle has no regional wall motion abnormalities. There is severe left ventricular hypertrophy. Left ventricular diastolic parameters are consistent with Grade I diastolic dysfunction (impaired relaxation).  2. Right ventricular systolic function is mildly reduced. The right ventricular size is normal. Moderately increased right ventricular wall thickness.  3. Left atrial size was severely dilated.  4. Right atrial size was severely dilated.  5. The mitral valve is normal in structure. Mild mitral valve regurgitation.  6. 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 There does not appear to be any significant paravalvular leak or leaflet involvement. As noted previously, significant prosthetic valve degeneration and stenosis. The aortic valve has been repaired/replaced. There is mild calcification of the aortic valve. There is moderate thickening of the aortic valve. Aortic valve regurgitation is not visualized. Echo findings are consistent with stenosis of the aortic prosthesis. Aortic valve area, by VTI measures 1.36 cm. Aortic valve mean gradient measures 30.5 mmHg. Aortic valve Vmax measures 3.80 m/s.  7. Patient with previous aortic Bentall procedure in 2016. There appears to be a  large pseudoaneursym primarily anterior to the aortic valve best seen adjacent to the right coronary cusp but extending circumferentially. Aortic dilatation noted.  8. Moderately dilated pulmonary artery.  9. The inferior vena cava is normal in size with greater than 50% respiratory variability, suggesting right atrial pressure of 3 mmHg. Conclusion(s)/Recommendation(s): Discussed findings of LVOT pseudoaneurysm with TAVR team. FINDINGS  Left Ventricle: Severe biventricular hypertrophy, reasonable to rule out amyloid. Left ventricular ejection fraction, by estimation, is 55 to 60%. The left ventricle has normal function. The left ventricle has no regional wall motion abnormalities. The left ventricular internal cavity size was normal in size. There is severe left ventricular hypertrophy. Abnormal (paradoxical) septal motion consistent with post-operative status. Left ventricular diastolic parameters are consistent with Grade I diastolic dysfunction (impaired relaxation). Right Ventricle: The right ventricular size is  normal. Moderately increased right ventricular wall thickness. Right ventricular systolic function is mildly reduced. Left Atrium: Left atrial size was severely dilated. Right Atrium: Right atrial size was severely dilated. Pericardium: Trivial pericardial effusion is present. Mitral Valve: The mitral valve is normal in structure. Mild mitral valve regurgitation. Tricuspid Valve: The tricuspid valve is normal in structure. Tricuspid valve regurgitation is mild. 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 There does not appear to be any significant paravalvular leak or leaflet involvement. As noted previously, significant prosthetic valve degeneration and stenosis. The aortic valve has been repaired/replaced. There is mild calcification of the aortic valve. There is moderate thickening of the aortic valve. Aortic valve regurgitation is not  visualized. Aortic valve mean gradient measures 30.5 mmHg. Aortic valve peak gradient measures 57.6 mmHg. Aortic valve area, by VTI measures 1.36 cm. There is a 27 mm Mitroflow pericardial valve pericardial valve present in the aortic position. Pulmonic Valve: The pulmonic valve was normal in structure. Pulmonic valve regurgitation is mild. Aorta: Patient with previous aortic Bentall procedure in 2016. There appears to be a large pseudoaneursym primarily anterior to the aortic valve best seen adjacent to the right coronary cusp but extending circumferentially. Aortic dilatation noted. Pulmonary Artery: The pulmonary artery is moderately dilated. Venous: The inferior vena cava is normal in size with greater than 50% respiratory variability, suggesting right atrial pressure of 3 mmHg. IAS/Shunts: No atrial level shunt detected by color flow Doppler.  LEFT VENTRICLE PLAX 2D LVIDd:         4.60 cm   Diastology LVIDs:         3.00 cm   LV e' medial:    5.33 cm/s LV PW:         1.50 cm   LV E/e' medial:  24.0 LV IVS:        1.80 cm   LV e' lateral:   10.60 cm/s LVOT diam:     2.40 cm   LV E/e' lateral: 12.1 LV SV:         104 LV SV Index:   58 LVOT Area:     4.52 cm  RIGHT VENTRICLE            IVC RV S prime:     9.25 cm/s  IVC diam: 1.10 cm TAPSE (M-mode): 1.6 cm RVSP:           28.0 mmHg LEFT ATRIUM           Index        RIGHT ATRIUM           Index LA diam:      4.30 cm 2.39 cm/m   RA Pressure: 3.00 mmHg LA Vol (A2C): 74.5 ml 41.47 ml/m  RA Area:     27.30 cm LA Vol (A4C): 96.8 ml 53.88 ml/m  RA Volume:   101.00 ml 56.22 ml/m  AORTIC VALVE AV Area (Vmax):    1.47 cm AV Area (Vmean):   1.37 cm AV Area (VTI):     1.36 cm AV Vmax:           379.50 cm/s AV Vmean:          255.500 cm/s AV VTI:            0.761 m AV Peak Grad:      57.6 mmHg AV Mean Grad:      30.5 mmHg LVOT Vmax:  123.00 cm/s LVOT Vmean:        77.400 cm/s LVOT VTI:          0.229 m LVOT/AV VTI ratio: 0.30  AORTA Ao Root diam: 4.80 cm Ao  Asc diam:  4.20 cm MITRAL VALVE                TRICUSPID VALVE MV Area (PHT): 3.65 cm     TR Peak grad:   25.0 mmHg MV Decel Time: 208 msec     TR Vmax:        250.00 cm/s MV E velocity: 128.00 cm/s  Estimated RAP:  3.00 mmHg MV A velocity: 105.00 cm/s  RVSP:           28.0 mmHg MV E/A ratio:  1.22                             SHUNTS                             Systemic VTI:  0.23 m                             Systemic Diam: 2.40 cm Clearnce Hasten Electronically signed by Clearnce Hasten Signature Date/Time: 02/15/2023/1:20:05 PM    Final (Updated)    CT ANGIO CHEST AORTA W/CM & OR WO/CM Result Date: 02/14/2023 CLINICAL DATA:  Preoperative examination prior to TAVR. EXAM: CT ANGIOGRAPHY CHEST, ABDOMEN AND PELVIS TECHNIQUE: Non-contrast CT of the chest was initially obtained. Multidetector CT imaging through the chest, abdomen and pelvis was performed using the standard protocol during bolus administration of intravenous contrast. Multiplanar reconstructed images and MIPs were obtained and reviewed to evaluate the vascular anatomy. RADIATION DOSE REDUCTION: This exam was performed according to the departmental dose-optimization program which includes automated exposure control, adjustment of the mA and/or kV according to patient size and/or use of iterative reconstruction technique. CONTRAST:  95mL OMNIPAQUE IOHEXOL 350 MG/ML SOLN COMPARISON:  CT of the chest, abdomen pelvis-01/16/2023; 01/09/2023; 04/27/2022; 11/24/2019; 12/02/2017 FINDINGS: CTA CHEST FINDINGS Cardiovascular: Stable sequela of open aortic valve repair and repair of the aortic root with debranching elephant trunk procedure and overlapping stent graft repair of the remainder of the ascending thoracic aorta, aortic arch and descending thoracic aorta. The deep branching elephant trunk bypass grafts appears widely patent. The thoracic aortic stent grafts appear widely patent without evidence of in stent stenosis or an endoleak as the native thoracic  aorta appears well apposed against the stent graft. Marked cardiomegaly. Coronary artery calcifications. No pericardial effusion. Although this examination was not tailored for the evaluation the pulmonary arteries, there are no discrete filling defects within the central pulmonary arterial tree to suggest central pulmonary embolism. Enlarged caliber of the main pulmonary artery measuring 4.4 cm in diameter (image 82, series 6), unchanged. Redemonstrated stenting of the left innominate vein which appears widely patent. Mediastinum/Nodes: Scattered mediastinal lymph nodes are numerous though individually not enlarged by size criteria with index precarinal node measuring 0.8 cm in greatest short axis diameter, presumably reactive given postoperative state. No worsening bulky mediastinal, hilar or axillary lymphadenopathy. Lungs/Pleura: Redemonstrated minimal dependent subpleural ground-glass atelectasis, left-greater-than-right. No discrete focal airspace opacities. No pleural effusion or pneumothorax. The central pulmonary airways appear widely patent. No discrete pulmonary nodules. Musculoskeletal: No acute or aggressive osseous abnormalities. Regional soft tissues appear normal. Normal appearance  thyroid gland. Review of the MIP images confirms the above findings. _________________________________________________________ CTA ABDOMEN AND PELVIS FINDINGS VASCULAR Aorta: The patient has undergone open repair of the perirenal abdominal aorta with surgical bypass grafting of the celiac, SMA and bilateral renal arteries all of which appear similar to prior examinations and without a hemodynamically significant narrowing. The presumed excluded portion of the native abdominal aorta is unchanged measuring 3.3 cm in diameter (image 159, series 6), similar to at least the 2019 examination. 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 The bilateral internal iliac arteries are diseased though patent and of normal caliber. The bilateral external iliac arteries are tortuous though of normal caliber and without hemodynamically significant narrowing. Veins: The IVC and pelvic venous systems appear patent on this arterial phase examination. Review of the MIP images confirms the above findings. _________________________________________________________ NON-VASCULAR Evaluation of the abdominal organs is limited to the arterial phase of enhancement. Hepatobiliary: Normal hepatic contour. Subcentimeter hypoattenuating lesion with the dome of the right lobe of the liver is too small to accurately characterize though favored to represent hepatic cyst. No discrete hyperenhancing lesions. There is a approximate 0.5 cm radiopaque gallstone within otherwise normal-appearing gallbladder. No gallbladder wall thickening or pericholecystic stranding. No intra extrahepatic biliary ductal dilatation. No ascites. Pancreas: Normal appearance of the pancreas. Spleen: Normal appearance of the spleen. Adrenals/Urinary Tract: There is slightly delayed enhancement of the left kidney comparison right. The left kidney is slightly atrophic comparison to the right. No evidence of obstruction. No evidence of nephrolithiasis postcontrast examination. Normal appearance of the bilateral adrenal glands. The urinary bladder is underdistended. Stomach/Bowel: Moderate colonic stool burden without evidence of enteric obstruction. Discrete areas of bowel wall thickening, though evaluation degraded secondary to lack of mesenteric fat. No  pneumoperitoneum, pneumatosis or portal venous gas. Lymphatic: No definitive bulky retroperitoneal, mesenteric, pelvic or inguinal lymphadenopathy on this arterial phase examination. Reproductive: Normal appearance of the prostate gland. No free fluid the pelvic cul-de-sac. Other: Regional soft tissues appear normal. Musculoskeletal: No acute or aggressive osseous abnormalities. There is mild diffuse increased sclerosis of the osseous structures as could be seen in the setting of renal osteodystrophy. Review of the MIP images confirms the above findings. IMPRESSION: Vascular Impression: 1. Stable sequela of open aortic valve repair and repair of the aortic root with debranching elephant trunk procedure and overlapping stent graft repair of the remainder of the ascending thoracic aorta, aortic arch and descending thoracic aorta. 2. Stable sequela of open repair of the perirenal abdominal aorta with surgical bypass grafting of the celiac, SMA and bilateral renal arteries all of which appear similar to prior examinations and without a hemodynamically significant narrowing. 3. 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 Aortic aneurysm NOS (ICD10-I71.9). 4. 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 5. Post stenting of the left innominate vein which appears widely patent. 6. Similar findings of cardiomegaly  with enlargement of the caliber of the main artery as we seen in the setting pulmonary arterial hypertension. 7. Aortic Atherosclerosis (ICD10-I70.0). Nonvascular Impression: 1. Cholelithiasis without evidence of acute cholecystitis. Electronically Signed   By: Simonne Come M.D.   On: 02/14/2023 14:36   CT ANGIO ABDOMEN PELVIS   W & WO CONTRAST Result Date: 02/14/2023 CLINICAL DATA:  Preoperative examination prior to TAVR. EXAM: CT ANGIOGRAPHY CHEST, ABDOMEN AND PELVIS TECHNIQUE: Non-contrast CT of the chest was initially obtained. Multidetector CT imaging through the chest, abdomen and pelvis was performed using the standard protocol during bolus administration of intravenous contrast. Multiplanar reconstructed images and MIPs were obtained and reviewed to evaluate the vascular anatomy. RADIATION DOSE REDUCTION: This exam was performed according to the departmental dose-optimization program which includes automated exposure control, adjustment of the mA and/or kV according to patient size and/or use of iterative reconstruction technique. CONTRAST:  95mL OMNIPAQUE IOHEXOL 350 MG/ML SOLN COMPARISON:  CT of the chest, abdomen pelvis-01/16/2023; 01/09/2023; 04/27/2022; 11/24/2019; 12/02/2017 FINDINGS: CTA CHEST FINDINGS Cardiovascular: Stable sequela of open aortic valve repair and repair of the aortic root with debranching elephant trunk procedure and overlapping stent graft repair of the remainder of the ascending thoracic aorta, aortic arch and descending thoracic aorta. The deep branching elephant trunk bypass grafts appears widely patent. The thoracic aortic stent grafts appear widely patent without evidence of in stent stenosis or an endoleak as the native thoracic aorta appears well apposed against the stent graft. Marked cardiomegaly. Coronary artery calcifications. No pericardial effusion. Although this examination was not tailored for the evaluation the pulmonary arteries, there are no discrete filling defects within the central pulmonary arterial tree to suggest central pulmonary embolism. Enlarged caliber of the main pulmonary artery measuring 4.4 cm in diameter (image 82, series 6), unchanged. Redemonstrated stenting of the left innominate vein which appears widely patent. Mediastinum/Nodes: Scattered mediastinal lymph nodes are  numerous though individually not enlarged by size criteria with index precarinal node measuring 0.8 cm in greatest short axis diameter, presumably reactive given postoperative state. No worsening bulky mediastinal, hilar or axillary lymphadenopathy. Lungs/Pleura: Redemonstrated minimal dependent subpleural ground-glass atelectasis, left-greater-than-right. No discrete focal airspace opacities. No pleural effusion or pneumothorax. The central pulmonary airways appear widely patent. No discrete pulmonary nodules. Musculoskeletal: No acute or aggressive osseous abnormalities. Regional soft tissues appear normal. Normal appearance thyroid gland. Review of the MIP images confirms the above findings. _________________________________________________________ CTA ABDOMEN AND PELVIS FINDINGS VASCULAR Aorta: The patient has undergone open repair of the perirenal abdominal aorta with surgical bypass grafting of the celiac, SMA and bilateral renal arteries all of which appear similar to prior examinations and without a hemodynamically significant narrowing. The presumed excluded portion of the native abdominal aorta is unchanged measuring 3.3 cm in diameter (image 159, series 6), similar to at least the 2019 examination. 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 The bilateral internal iliac arteries are diseased though patent and of normal caliber. The bilateral external iliac arteries are tortuous though of normal caliber and without hemodynamically significant narrowing. Veins: The IVC  and pelvic venous systems appear patent on this arterial  phase examination. Review of the MIP images confirms the above findings. _________________________________________________________ NON-VASCULAR Evaluation of the abdominal organs is limited to the arterial phase of enhancement. Hepatobiliary: Normal hepatic contour. Subcentimeter hypoattenuating lesion with the dome of the right lobe of the liver is too small to accurately characterize though favored to represent hepatic cyst. No discrete hyperenhancing lesions. There is a approximate 0.5 cm radiopaque gallstone within otherwise normal-appearing gallbladder. No gallbladder wall thickening or pericholecystic stranding. No intra extrahepatic biliary ductal dilatation. No ascites. Pancreas: Normal appearance of the pancreas. Spleen: Normal appearance of the spleen. Adrenals/Urinary Tract: There is slightly delayed enhancement of the left kidney comparison right. The left kidney is slightly atrophic comparison to the right. No evidence of obstruction. No evidence of nephrolithiasis postcontrast examination. Normal appearance of the bilateral adrenal glands. The urinary bladder is underdistended. Stomach/Bowel: Moderate colonic stool burden without evidence of enteric obstruction. Discrete areas of bowel wall thickening, though evaluation degraded secondary to lack of mesenteric fat. No pneumoperitoneum, pneumatosis or portal venous gas. Lymphatic: No definitive bulky retroperitoneal, mesenteric, pelvic or inguinal lymphadenopathy on this arterial phase examination. Reproductive: Normal appearance of the prostate gland. No free fluid the pelvic cul-de-sac. Other: Regional soft tissues appear normal. Musculoskeletal: No acute or aggressive osseous abnormalities. There is mild diffuse increased sclerosis of the osseous structures as could be seen in the setting of renal osteodystrophy. Review of the MIP images confirms the above findings. IMPRESSION: Vascular  Impression: 1. Stable sequela of open aortic valve repair and repair of the aortic root with debranching elephant trunk procedure and overlapping stent graft repair of the remainder of the ascending thoracic aorta, aortic arch and descending thoracic aorta. 2. Stable sequela of open repair of the perirenal abdominal aorta with surgical bypass grafting of the celiac, SMA and bilateral renal arteries all of which appear similar to prior examinations and without a hemodynamically significant narrowing. 3. 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 Aortic aneurysm NOS (ICD10-I71.9). 4. 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 5. Post stenting of the left innominate vein which appears widely patent. 6. Similar findings of cardiomegaly with enlargement of the caliber of the main artery as we seen in the setting pulmonary arterial hypertension. 7. Aortic Atherosclerosis (ICD10-I70.0). Nonvascular Impression: 1. Cholelithiasis without evidence of acute cholecystitis. Electronically Signed   By: Simonne Come M.D.   On: 02/14/2023 14:36    Microbiology: Results for orders placed or performed during the hospital encounter of 03/01/23  Resp panel by RT-PCR (RSV, Flu A&B, Covid) Peripheral     Status: None   Collection Time: 03/02/23 12:40 AM   Specimen: Peripheral; Nasal Swab  Result Value Ref Range Status   SARS Coronavirus 2 by RT PCR NEGATIVE NEGATIVE Final   Influenza A by PCR NEGATIVE NEGATIVE Final   Influenza B by PCR NEGATIVE NEGATIVE Final    Comment: (NOTE) The Xpert Xpress SARS-CoV-2/FLU/RSV plus assay is intended as an aid in the diagnosis of influenza from Nasopharyngeal swab specimens and should not be used  as a sole basis for treatment. Nasal washings and aspirates are unacceptable for Xpert Xpress SARS-CoV-2/FLU/RSV testing.  Fact Sheet for Patients: BloggerCourse.com  Fact Sheet for Healthcare Providers: SeriousBroker.it  This test is not yet approved or cleared by the [place] FDA and has been authorized for detection and/or diagnosis of SARS-CoV-2 by FDA under an Emergency Use Authorization (EUA). This EUA will remain in effect (meaning this test can be used) for the duration of the COVID-19 declaration under Section 564(b)(1) of the Act, 21 U.S.C. section 360bbb-3(b)(1), unless the authorization is terminated or revoked.     Resp Syncytial Virus by PCR NEGATIVE NEGATIVE Final    Comment: (NOTE) Fact Sheet for Patients: BloggerCourse.com  Fact Sheet for Healthcare Providers: SeriousBroker.it  This test is not yet approved or cleared by the [place] FDA and has been authorized for detection and/or diagnosis of SARS-CoV-2 by FDA under an Emergency Use Authorization (EUA). This EUA will remain in effect (meaning this test can be used) for the duration of the COVID-19 declaration under Section 564(b)(1) of the Act, 21 U.S.C. section 360bbb-3(b)(1), unless the authorization is terminated or revoked.  Performed at [facility], [address] [place], [place] [address]   Blood Culture (routine x 2)     Status: None (Preliminary result)   Collection Time: 03/02/23 12:40 AM   Specimen: BLOOD  Result Value Ref Range Status   Specimen Description BLOOD SITE NOT SPECIFIED  Final   Special Requests   Final    BOTTLES DRAWN AEROBIC AND ANAEROBIC Blood Culture results may not be optimal due to an excessive volume of blood received in culture bottles   Culture   Final    NO GROWTH 2 DAYS Performed at [facility], [address], [place], [place] [address]     Report Status PENDING  Incomplete  Blood Culture (routine x 2)     Status: None (Preliminary result)   Collection Time: 03/02/23 12:51 AM   Specimen: BLOOD  Result Value Ref Range Status   Specimen Description BLOOD RIGHT ANTECUBITAL  Final   Special Requests   Final    BOTTLES DRAWN AEROBIC AND ANAEROBIC Blood Culture adequate volume   Culture   Final    NO GROWTH 2 DAYS Performed at [facility], [address], [place], [place] [address]    Report Status PENDING  Incomplete  MRSA Next Gen by PCR, Nasal     Status: None   Collection Time: 03/02/23  8:38 AM   Specimen: Nasal Mucosa; Nasal Swab  Result Value Ref Range Status   MRSA by PCR Next Gen NOT DETECTED NOT DETECTED Final    Comment: (NOTE) The GeneXpert MRSA Assay (FDA approved for NASAL specimens only), is one component of a comprehensive MRSA colonization surveillance program. It is not intended to diagnose MRSA infection nor to guide or monitor treatment for MRSA infections. Test performance is not FDA approved in patients less than 5 years old. Performed at [facility], [address] [place], [place] [address]     Labs: CBC: Recent Labs  Lab 03/02/23 0014 03/02/23 0552 03/04/23 0310  WBC 7.7 11.0* 5.6  NEUTROABS 6.3  --  3.6  HGB 10.3* 10.0* 10.1*  HCT 32.3* 32.4* 31.6*  MCV 99.1 101.6* 98.1  PLT 236 139* 96*   Basic Metabolic Panel: Recent Labs  Lab 03/02/23 0206 03/02/23 0552 03/04/23 0310  NA 140 138 136  K 4.2 5.6* 3.7  CL 110 110 102  CO2 18* 19* 25  GLUCOSE 88 85 90  BUN 40* 41* 41*  CREATININE 7.85* 8.26* 6.71*  CALCIUM 8.8* 8.7* 9.3  MG  --  2.1  --   PHOS  --  5.6*  5.6*  --    Liver Function Tests: Recent Labs  Lab 03/02/23 0206 03/02/23 0552  AST 12*  --   ALT 6  --   ALKPHOS 62  --   BILITOT 0.6  --   PROT 6.1*  --   ALBUMIN 2.9* 3.0*   CBG: Recent Labs  Lab 03/02/23 0920  GLUCAP 92    Discharge time spent: greater than 30  minutes.  Signed: Jonah Blue, MD [facility] 03/04/2023

## 2023-03-04 NOTE — Procedures (Signed)
I have reviewed the HD regimen and made appropriate changes.  Vinson Moselle MD  [facility] 03/04/2023, 4:16 PM

## 2023-03-04 NOTE — Progress Notes (Signed)
   03/04/23 1107  Vitals  Resp 18  BP 134/85  SpO2 98 %  O2 Device Room Air  Oxygen Therapy  Patient Activity (if Appropriate) In bed  Pulse Oximetry Type Continuous  Post Treatment  Dialyzer Clearance Lightly streaked  Liters Processed 71.9  Fluid Removed (mL) 3000 mL  Tolerated HD Treatment Yes  AVG/AVF Arterial Site Held (minutes) 10 minutes  AVG/AVF Venous Site Held (minutes) 10 minutes   Received patient in bed to unit.  Alert and oriented.  Informed consent signed and in chart.   TX duration:3.0 HOURS  Patient tolerated well.  Transported back to the room  Alert, without acute distress.  Hand-off given to patient's nurse.   Access used: YES Access issues: NO   Medication(s) given: NO    Laqueta Due, RN [facility]

## 2023-03-04 NOTE — Progress Notes (Signed)
Pt d/c today. Contacted [facility] to be advised of pt's d/c date and that pt should resume care on Saturday.   Olivia Canter Renal Navigator [phone]

## 2023-03-04 NOTE — Discharge Planning (Signed)
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  HD ORDER CHANGES: Heparin change: no EDW Change: YES New EDW: 62kg *continue to challenge if BP high* [place] Change: no  ANEMIA MANAGEMENT: Aranesp: Given: no   ESA dose for discharge: mircera 100 mcg IV q 2 weeks, to start on 12/24 IV Iron dose at discharge: per protocol Transfusion: Given: no  BONE/MINERAL MEDICATIONS: Hectorol/Calcitriol change: no Sensipar/Parsabiv change: no  ACCESS INTERVENTION/CHANGE: no Details:  RECENT LABS: Recent Labs  Lab 03/02/23 0552 03/04/23 0310  HGB 10.0* 10.1*  NA 138 136  K 5.6* 3.7  CALCIUM 8.7* 9.3  PHOS 5.6*  5.6*  --   ALBUMIN 3.0*  --    IV ANTIBIOTICS: no Details: Will be finishing course of PO abx  OTHER ANTICOAGULATION: On Coumadin?: no  OTHER/APPTS/LAB ORDERS:  - Make sure to keep upcoming appts with [facility] surgery  D/C Meds to be reconciled by nurse after every discharge.  Completed By: Ozzie Hoyle, PA-C [facility] Pager [phone]   Reviewed by: MD:______ RN_______

## 2023-03-05 ENCOUNTER — Telehealth (HOSPITAL_COMMUNITY): Payer: Self-pay | Admitting: Nephrology

## 2023-03-05 NOTE — Telephone Encounter (Signed)
Transition of Care - Initial Contact from Inpatient Facility  Date of discharge: 03/04/23 Date of contact: 03/05/23  Method: Phone Spoke to: Patient  Patient contacted to discuss transition of care from recent inpatient hospitalization. Patient was admitted to [facility] from 12/16 -03/04/23 with discharge diagnosis of HTN emergency, AHRF  The discharge medication list was reviewed. Says taking the cephalexin at night is causing him to wake up with nausea - suggested taking earlier in day with food (dinner). Can also try pro-biotic. If still doesn't work - needs to reach out to heart doctor for alternative plan.  Patient will return to his/her outpatient HD unit on: tomorrow (TTS) schedule. We discussed new/lower EDW  No other concerns at this time.  Ozzie Hoyle, PA-C [employer] Pager [phone]

## 2023-03-07 LAB — CULTURE, BLOOD (ROUTINE X 2)
Culture: NO GROWTH
Culture: NO GROWTH
Special Requests: ADEQUATE

## 2023-03-15 ENCOUNTER — Encounter: Payer: 59 | Admitting: Surgery

## 2023-03-18 ENCOUNTER — Other Ambulatory Visit (HOSPITAL_COMMUNITY): Payer: 59

## 2023-03-30 ENCOUNTER — Ambulatory Visit: Payer: 59 | Admitting: Infectious Diseases

## 2023-04-14 ENCOUNTER — Other Ambulatory Visit: Payer: Self-pay

## 2023-04-14 ENCOUNTER — Emergency Department (HOSPITAL_COMMUNITY): Payer: 59

## 2023-04-14 ENCOUNTER — Encounter (HOSPITAL_COMMUNITY): Payer: Self-pay | Admitting: Pharmacy Technician

## 2023-04-14 ENCOUNTER — Inpatient Hospital Stay (HOSPITAL_COMMUNITY)
Admission: EM | Admit: 2023-04-14 | Discharge: 2023-04-19 | DRG: 280 | Disposition: A | Discharge status: Home / Self Care | Payer: 59 | Attending: Internal Medicine | Admitting: Internal Medicine

## 2023-04-14 DIAGNOSIS — I38 Endocarditis, valve unspecified: Secondary | ICD-10-CM

## 2023-04-14 DIAGNOSIS — Y831 Surgical operation with implant of artificial internal device as the cause of abnormal reaction of the patient, or of later complication, without mention of misadventure at the time of the procedure: Secondary | ICD-10-CM | POA: Diagnosis present

## 2023-04-14 DIAGNOSIS — I2510 Atherosclerotic heart disease of native coronary artery without angina pectoris: Secondary | ICD-10-CM | POA: Diagnosis present

## 2023-04-14 DIAGNOSIS — I729 Aneurysm of unspecified site: Secondary | ICD-10-CM

## 2023-04-14 DIAGNOSIS — I5042 Chronic combined systolic (congestive) and diastolic (congestive) heart failure: Secondary | ICD-10-CM | POA: Diagnosis not present

## 2023-04-14 DIAGNOSIS — J449 Chronic obstructive pulmonary disease, unspecified: Secondary | ICD-10-CM

## 2023-04-14 DIAGNOSIS — D631 Anemia in chronic kidney disease: Secondary | ICD-10-CM | POA: Diagnosis present

## 2023-04-14 DIAGNOSIS — E785 Hyperlipidemia, unspecified: Secondary | ICD-10-CM

## 2023-04-14 DIAGNOSIS — I214 Non-ST elevation (NSTEMI) myocardial infarction: Principal | ICD-10-CM

## 2023-04-14 DIAGNOSIS — I080 Rheumatic disorders of both mitral and aortic valves: Secondary | ICD-10-CM | POA: Diagnosis present

## 2023-04-14 DIAGNOSIS — Z66 Do not resuscitate: Secondary | ICD-10-CM | POA: Diagnosis present

## 2023-04-14 DIAGNOSIS — Z955 Presence of coronary angioplasty implant and graft: Secondary | ICD-10-CM

## 2023-04-14 DIAGNOSIS — Z86718 Personal history of other venous thrombosis and embolism: Secondary | ICD-10-CM

## 2023-04-14 DIAGNOSIS — F17210 Nicotine dependence, cigarettes, uncomplicated: Secondary | ICD-10-CM | POA: Diagnosis present

## 2023-04-14 DIAGNOSIS — I252 Old myocardial infarction: Secondary | ICD-10-CM

## 2023-04-14 DIAGNOSIS — N186 End stage renal disease: Secondary | ICD-10-CM

## 2023-04-14 DIAGNOSIS — M898X9 Other specified disorders of bone, unspecified site: Secondary | ICD-10-CM | POA: Diagnosis present

## 2023-04-14 DIAGNOSIS — Z801 Family history of malignant neoplasm of trachea, bronchus and lung: Secondary | ICD-10-CM

## 2023-04-14 DIAGNOSIS — I25119 Atherosclerotic heart disease of native coronary artery with unspecified angina pectoris: Secondary | ICD-10-CM | POA: Diagnosis not present

## 2023-04-14 DIAGNOSIS — Z8673 Personal history of transient ischemic attack (TIA), and cerebral infarction without residual deficits: Secondary | ICD-10-CM

## 2023-04-14 DIAGNOSIS — Z992 Dependence on renal dialysis: Secondary | ICD-10-CM

## 2023-04-14 DIAGNOSIS — E1122 Type 2 diabetes mellitus with diabetic chronic kidney disease: Secondary | ICD-10-CM | POA: Diagnosis present

## 2023-04-14 DIAGNOSIS — Z7951 Long term (current) use of inhaled steroids: Secondary | ICD-10-CM

## 2023-04-14 DIAGNOSIS — I330 Acute and subacute infective endocarditis: Secondary | ICD-10-CM | POA: Diagnosis present

## 2023-04-14 DIAGNOSIS — I10 Essential (primary) hypertension: Secondary | ICD-10-CM

## 2023-04-14 DIAGNOSIS — E875 Hyperkalemia: Secondary | ICD-10-CM | POA: Diagnosis present

## 2023-04-14 DIAGNOSIS — T826XXA Infection and inflammatory reaction due to cardiac valve prosthesis, initial encounter: Secondary | ICD-10-CM | POA: Diagnosis present

## 2023-04-14 DIAGNOSIS — Z8349 Family history of other endocrine, nutritional and metabolic diseases: Secondary | ICD-10-CM

## 2023-04-14 DIAGNOSIS — Z8249 Family history of ischemic heart disease and other diseases of the circulatory system: Secondary | ICD-10-CM

## 2023-04-14 DIAGNOSIS — I428 Other cardiomyopathies: Secondary | ICD-10-CM | POA: Diagnosis present

## 2023-04-14 DIAGNOSIS — Z885 Allergy status to narcotic agent status: Secondary | ICD-10-CM

## 2023-04-14 DIAGNOSIS — K219 Gastro-esophageal reflux disease without esophagitis: Secondary | ICD-10-CM

## 2023-04-14 DIAGNOSIS — E782 Mixed hyperlipidemia: Secondary | ICD-10-CM

## 2023-04-14 DIAGNOSIS — Z7982 Long term (current) use of aspirin: Secondary | ICD-10-CM

## 2023-04-14 DIAGNOSIS — Z800 Family history of malignant neoplasm of digestive organs: Secondary | ICD-10-CM

## 2023-04-14 DIAGNOSIS — Z79899 Other long term (current) drug therapy: Secondary | ICD-10-CM

## 2023-04-14 DIAGNOSIS — Z952 Presence of prosthetic heart valve: Secondary | ICD-10-CM

## 2023-04-14 DIAGNOSIS — Z9889 Other specified postprocedural states: Secondary | ICD-10-CM

## 2023-04-14 DIAGNOSIS — T826XXD Infection and inflammatory reaction due to cardiac valve prosthesis, subsequent encounter: Secondary | ICD-10-CM

## 2023-04-14 DIAGNOSIS — I161 Hypertensive emergency: Secondary | ICD-10-CM | POA: Diagnosis present

## 2023-04-14 DIAGNOSIS — Z823 Family history of stroke: Secondary | ICD-10-CM

## 2023-04-14 DIAGNOSIS — Z833 Family history of diabetes mellitus: Secondary | ICD-10-CM

## 2023-04-14 DIAGNOSIS — Z888 Allergy status to other drugs, medicaments and biological substances status: Secondary | ICD-10-CM

## 2023-04-14 DIAGNOSIS — Z8679 Personal history of other diseases of the circulatory system: Secondary | ICD-10-CM

## 2023-04-14 DIAGNOSIS — Z515 Encounter for palliative care: Secondary | ICD-10-CM

## 2023-04-14 DIAGNOSIS — N2581 Secondary hyperparathyroidism of renal origin: Secondary | ICD-10-CM | POA: Diagnosis present

## 2023-04-14 DIAGNOSIS — Z951 Presence of aortocoronary bypass graft: Secondary | ICD-10-CM

## 2023-04-14 DIAGNOSIS — Z7189 Other specified counseling: Secondary | ICD-10-CM

## 2023-04-14 DIAGNOSIS — I132 Hypertensive heart and chronic kidney disease with heart failure and with stage 5 chronic kidney disease, or end stage renal disease: Secondary | ICD-10-CM | POA: Diagnosis present

## 2023-04-14 HISTORY — DX: Cerebral infarction due to embolism of left middle cerebral artery: I63.412

## 2023-04-14 LAB — CBC
HCT: 42.9 % (ref 39.0–52.0)
Hemoglobin: 13.6 g/dL (ref 13.0–17.0)
MCH: 30.6 pg (ref 26.0–34.0)
MCHC: 31.7 g/dL (ref 30.0–36.0)
MCV: 96.6 fL (ref 80.0–100.0)
Platelets: 89 10*3/uL — ABNORMAL LOW (ref 150–400)
RBC: 4.44 MIL/uL (ref 4.22–5.81)
RDW: 14.2 % (ref 11.5–15.5)
WBC: 6.1 10*3/uL (ref 4.0–10.5)
nRBC: 0 % (ref 0.0–0.2)

## 2023-04-14 LAB — BASIC METABOLIC PANEL
Anion gap: 18 — ABNORMAL HIGH (ref 5–15)
BUN: 41 mg/dL — ABNORMAL HIGH (ref 6–20)
CO2: 24 mmol/L (ref 22–32)
Calcium: 10 mg/dL (ref 8.9–10.3)
Chloride: 96 mmol/L — ABNORMAL LOW (ref 98–111)
Creatinine, Ser: 6.62 mg/dL — ABNORMAL HIGH (ref 0.61–1.24)
GFR, Estimated: 9 mL/min — ABNORMAL LOW (ref >60)
Glucose, Bld: 104 mg/dL — ABNORMAL HIGH (ref 70–99)
Potassium: 3.4 mmol/L — ABNORMAL LOW (ref 3.5–5.1)
Sodium: 138 mmol/L (ref 135–145)

## 2023-04-14 LAB — MAGNESIUM: Magnesium: 2.2 mg/dL (ref 1.7–2.4)

## 2023-04-14 LAB — TROPONIN I (HIGH SENSITIVITY)
Troponin I (High Sensitivity): 400 ng/L
Troponin I (High Sensitivity): 4234 ng/L (ref <18)
Troponin I (High Sensitivity): 8100 ng/L (ref <18)
Troponin I (High Sensitivity): 8447 ng/L (ref <18)

## 2023-04-14 MED ORDER — ONDANSETRON HCL 4 MG/2ML IJ SOLN: 4.0000 mg | Freq: Four times a day (QID) | INTRAMUSCULAR | Status: DC | PRN

## 2023-04-14 MED ORDER — AMLODIPINE BESYLATE 10 MG PO TABS
10.0000 mg | ORAL_TABLET | Freq: Every day | ORAL | Status: DC
  Administered 2023-04-15 – 2023-04-18 (×2): 10 mg via ORAL
  Filled 2023-04-14: qty 1
  Filled 2023-04-14: qty 2
  Filled 2023-04-14 (×2): qty 1

## 2023-04-14 MED ORDER — HYDRALAZINE HCL 50 MG PO TABS
50.0000 mg | ORAL_TABLET | Freq: Three times a day (TID) | ORAL | Status: DC
  Administered 2023-04-14 – 2023-04-18 (×8): 50 mg via ORAL
  Filled 2023-04-14 (×13): qty 1

## 2023-04-14 MED ORDER — PANTOPRAZOLE SODIUM 40 MG PO TBEC
40.0000 mg | DELAYED_RELEASE_TABLET | Freq: Every day | ORAL | Status: DC
  Administered 2023-04-15 – 2023-04-19 (×5): 40 mg via ORAL
  Filled 2023-04-14 (×5): qty 1

## 2023-04-14 MED ORDER — HYDROMORPHONE HCL 1 MG/ML IJ SOLN
0.5000 mg | Freq: Once | INTRAMUSCULAR | Status: AC
  Administered 2023-04-14: 0.5 mg via INTRAVENOUS
  Filled 2023-04-14: qty 1

## 2023-04-14 MED ORDER — MORPHINE SULFATE (PF) 4 MG/ML IV SOLN
4.0000 mg | Freq: Once | INTRAVENOUS | Status: AC
  Administered 2023-04-14: 4 mg via INTRAVENOUS
  Filled 2023-04-14: qty 1

## 2023-04-14 MED ORDER — HEPARIN BOLUS VIA INFUSION
4000.0000 [IU] | Freq: Once | INTRAVENOUS | Status: AC
  Administered 2023-04-14: 4000 [IU] via INTRAVENOUS
  Filled 2023-04-14: qty 4000

## 2023-04-14 MED ORDER — CEPHALEXIN 500 MG PO CAPS
1000.0000 mg | ORAL_CAPSULE | Freq: Every day | ORAL | Status: DC
  Administered 2023-04-14 – 2023-04-18 (×5): 1000 mg via ORAL
  Filled 2023-04-14 (×3): qty 2
  Filled 2023-04-14: qty 4
  Filled 2023-04-14: qty 2

## 2023-04-14 MED ORDER — UMECLIDINIUM BROMIDE 62.5 MCG/ACT IN AEPB
1.0000 | INHALATION_SPRAY | Freq: Every day | RESPIRATORY_TRACT | Status: DC
  Administered 2023-04-17 – 2023-04-19 (×3): 1 via RESPIRATORY_TRACT
  Filled 2023-04-14 (×2): qty 7

## 2023-04-14 MED ORDER — HEPARIN (PORCINE) 25000 UT/250ML-% IV SOLN
1400.0000 [IU]/h | INTRAVENOUS | Status: DC
  Administered 2023-04-14: 900 [IU]/h via INTRAVENOUS
  Administered 2023-04-15: 1150 [IU]/h via INTRAVENOUS
  Administered 2023-04-16: 1400 [IU]/h via INTRAVENOUS
  Filled 2023-04-14 (×3): qty 250

## 2023-04-14 MED ORDER — IOHEXOL 350 MG/ML SOLN
75.0000 mL | Freq: Once | INTRAVENOUS | Status: AC | PRN
  Administered 2023-04-14: 75 mL via INTRAVENOUS

## 2023-04-14 MED ORDER — POTASSIUM CHLORIDE CRYS ER 20 MEQ PO TBCR: 40.0000 meq | EXTENDED_RELEASE_TABLET | Freq: Once | ORAL | Status: DC

## 2023-04-14 MED ORDER — ASPIRIN 81 MG PO CHEW
324.0000 mg | CHEWABLE_TABLET | Freq: Once | ORAL | Status: AC
  Administered 2023-04-14: 324 mg via ORAL
  Filled 2023-04-14: qty 4

## 2023-04-14 MED ORDER — ARFORMOTEROL TARTRATE 15 MCG/2ML IN NEBU
15.0000 ug | INHALATION_SOLUTION | Freq: Two times a day (BID) | RESPIRATORY_TRACT | Status: DC
  Administered 2023-04-15 – 2023-04-19 (×6): 15 ug via RESPIRATORY_TRACT
  Filled 2023-04-14 (×8): qty 2

## 2023-04-14 MED ORDER — CLOPIDOGREL BISULFATE 75 MG PO TABS
75.0000 mg | ORAL_TABLET | Freq: Every day | ORAL | Status: DC
  Administered 2023-04-14 – 2023-04-19 (×6): 75 mg via ORAL
  Filled 2023-04-14 (×6): qty 1

## 2023-04-14 MED ORDER — ACETAMINOPHEN 325 MG PO TABS
650.0000 mg | ORAL_TABLET | ORAL | Status: DC | PRN
Start: 2023-04-14 — End: 2023-04-19

## 2023-04-14 MED ORDER — HYDROMORPHONE HCL 1 MG/ML IJ SOLN
1.0000 mg | Freq: Once | INTRAMUSCULAR | Status: AC
  Administered 2023-04-14: 1 mg via INTRAVENOUS
  Filled 2023-04-14: qty 1

## 2023-04-14 MED ORDER — CLONIDINE HCL 0.1 MG PO TABS
0.2000 mg | ORAL_TABLET | Freq: Two times a day (BID) | ORAL | Status: DC
  Administered 2023-04-14 – 2023-04-18 (×5): 0.2 mg via ORAL
  Filled 2023-04-14 (×3): qty 2
  Filled 2023-04-14 (×2): qty 1
  Filled 2023-04-14 (×4): qty 2

## 2023-04-14 MED ORDER — NITROGLYCERIN 2 % TD OINT
1.0000 [in_us] | TOPICAL_OINTMENT | Freq: Once | TRANSDERMAL | Status: AC
  Administered 2023-04-14: 1 [in_us] via TOPICAL
  Filled 2023-04-14: qty 1

## 2023-04-14 MED ORDER — CARVEDILOL 12.5 MG PO TABS
12.5000 mg | ORAL_TABLET | Freq: Two times a day (BID) | ORAL | Status: DC
  Administered 2023-04-14 – 2023-04-15 (×2): 12.5 mg via ORAL
  Filled 2023-04-14 (×2): qty 1

## 2023-04-14 MED ORDER — ASPIRIN 81 MG PO CHEW
81.0000 mg | CHEWABLE_TABLET | Freq: Every day | ORAL | Status: DC
  Administered 2023-04-15 – 2023-04-19 (×5): 81 mg via ORAL
  Filled 2023-04-14 (×6): qty 1

## 2023-04-14 MED ORDER — OXYCODONE-ACETAMINOPHEN 5-325 MG PO TABS
1.0000 | ORAL_TABLET | ORAL | Status: DC | PRN
  Administered 2023-04-14 – 2023-04-15 (×4): 1 via ORAL
  Filled 2023-04-14 (×6): qty 1

## 2023-04-14 NOTE — H&P (Signed)
History and Physical   [name] VHQ:[id number] DOB: Nov 08, 1970 DOA: 04/14/2023  PCP: Loura Back, NP   Patient coming from: Home  Chief Complaint: Chest pain  HPI: [name] is a 53 y.o. male with medical history significant of hypertension, hyperlipidemia, CVA, seizure, CAD, ESRD on HD, GERD, CHF, anxiety, depression, abdominal aortic dissection s/p repair, COPD, R IJ thrombosis, prosthetic valve endocarditis, aortic stenosis, pericarditis presenting with chest pain.  Patient reports he was taking a shower this morning when he leaned forward and had sudden onset left-sided chest pain.  Radiated to his Left shoulder, L neck, and back.  He tried to lay down but pain continued to increase.  States the pain has been constant but eased off some in the ED.  Denies fevers, chills, abdominal pain, constipation, diarrhea, nausea, vomiting.  ED Course: Vital signs in the ED notable for blood pressure in the 130s to 150 systolic.  Lab workup included BMP with potassium 3.4, chloride 96, BUN 41, creatinine stable at 6.62, 18, glucose 104.  CBC with platelets 89.  Troponin trend 400, 4000 234, repeat pending.  Chest x-ray showed no acute Gershon Mussel.  CT a of the chest abdomen pelvis showed no dissection or other acute abnormality.  Stable aortic repair after previous dissection, stable distal abdominal aortic aneurysm, stable bilateral iliac artery aneurysm.  Cardiology was consulted in the ED and patient received aspirin, morphine, Dilaudid, Coreg, nitro, heparin infusion.  Cardiology recommended n.p.o. at midnight with plan for cath tomorrow.  Review of Systems: As per HPI otherwise all other systems reviewed and are negative.  Past Medical History:  Diagnosis Date   Acute bronchitis 01/09/2023   Acute on chronic combined systolic and diastolic CHF (congestive heart failure) (HCC) 12/08/2017   Acute renal failure superimposed on stage 3 chronic kidney disease (HCC) 01/26/2012   Adenomatous colon  polyp 08/2015   Anxiety    Aortic disease (HCC)    Aortic dissection (HCC)    a. admx 04/2014 >> L renal infarct; a/c renal failure >> b.  s/p Bioprosthetic Bentall and total arch replacement and staged endovascular repair of descending aortic aneurysm ([facility] - Dr. Kizzie Bane)   CAD (coronary artery disease)    a. LHC 4/16:  oD1 60%   Cardiomyopathy (HCC)    a. non-ischemic - probably related to untreated HTN and ETOH abuse - Echo 3/13 with EF 35-40% >> b. Echo 4/16: Severe LVH, EF 55-60%, moderate AI, moderate MR, mild LAE, trivial effusion, known type B dissection with communication between true and false lumens with suprasternal images suggesting dissection plane may propagate to at least left subclavian takeoff, root above aortic valve okay     Cerebral infarction due to embolism of left middle cerebral artery (HCC) 06/03/2016   Cerebrovascular accident (CVA) due to embolism of left middle cerebral artery (HCC)    Chronic abdominal pain    Chronic combined systolic and diastolic congestive heart failure (HCC)    a. 05/2011: Adm with pulm edema/HTN urgency, EF 35-40% with diffuse hypokinesis and moderate to severe mitral regurgitation. Cardiomyopathy likely due to uncontrolled HTN and ETOH abuse - cath deferred due to renal insufficiency (felt due to uncontrolled HTN). bElzie Rings MV 06/2011: EF 37% and no ischemia or infarction. c. EF 45-50% by echo 01/2012.   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 on dialysis 3-4 years total   ETOH abuse    a. Reported to have quit 05/2011.   Frequent headaches    GERD  (gastroesophageal reflux disease)    Headache(784.0)    "q other day" (08/08/2013)   Hemorrhoid thrombosis    History of echocardiogram    Echo 1/17:  Severe LVH, EF 55-60%, no RWMA, Gr 2 DD, AVR ok, mild to mod MR, mild LAE, mild reduced RVSF, mod RAE   History of medication noncompliance    HYPERLIPIDEMIA    Hypertension    a. Hx of HTN urgency secondary to noncompliance. b. urinary metanephrine and catecholeamine levels normal 2013.  c. Renal art [name] 1/16:  No evidence of renal artery stenosis noted bilaterally.   INGUINAL HERNIA    Pneumonia ~ 2013   Sepsis (HCC) 01/18/2023   Serrated adenoma of colon 08/2015   SIRS (systemic inflammatory response syndrome) (HCC) 01/09/2023   Stroke (HCC)    Tobacco abuse    Valvular heart disease    a. Echo 05/2011: moderate to severe eccentric MR and mild to moderate AI with prolapsing left coronary cusp. b. Echo 01/2012: mild-mod AI, mild dilitation of aortic root, mild MR.;  c. Echo 1/16: Severe LVH consistent with hypertrophic cardio myopathy, EF 50%, no RWMA, mod AI, mild MR, mild RAE, dilated Ao root (40 mm);     Past Surgical History:  Procedure Laterality Date   A/V FISTULAGRAM Left 03/07/2021   Procedure: A/V FISTULAGRAM;  Surgeon: Chuck Hint, MD;  Location: [facility] INVASIVE CV LAB;  Service: Cardiovascular;  Laterality: Left;   A/V FISTULAGRAM Left 07/18/2021   Procedure: A/V Fistulagram;  Surgeon: Chuck Hint, MD;  Location: [facility] INVASIVE CV LAB;  Service: Cardiovascular;  Laterality: Left;   A/V FISTULAGRAM Left 12/25/2022   Procedure: A/V Fistulagram;  Surgeon: Leonie Douglas, MD;  Location[facility] INVAS[facility] CV LAB;  Service: Cardiovascular;  Laterality: Left;   ANKLE SURGERY Bilateral    Fractures bilaterally   AORTIC VALVE SURGERY  09/2014   AV FISTULA PLACEMENT Left 09/09/2020   Procedure: LEFT ARM ARTERIOVENOUS (AV) FISTULA CREATION;  Surgeon: Leonie Douglas, MD;  Location: [facility] OR;  Service: Vascular;  Laterality: Left;    BASCILIC VEIN TRANSPOSITION Left 10/09/2020   Procedure: LEFT SECOND STAGE BASILIC VEIN TRANSPOSITION;  Surgeon: Leonie Douglas, MD;  Location: [facility] OR;  Service: Vascular;  Laterality: Left;  PERIPHERAL NERVE BLOCK   CORONARY ANGIOGRAPHY N/A 12/06/2017   Procedure: CORONARY ANGIOGRAPHY;  Surgeon: Lyn Records, MD;  Location: [facility] INVASIVE CV LAB;  Service: Cardiovascular;  Laterality: N/A;   CORONARY ANGIOGRAPHY N/A 09/26/2018   Procedure: CORONARY ANGIOGRAPHY;  Surgeon: Yvonne Kendall, MD;  Location: [facility] INVASIVE CV LAB;  Service: Cardiovascular;  Laterality: N/A;   CORONARY STENT INTERVENTION N/A 12/10/2017   Procedure: CORONARY STENT INTERVENTION;  Surgeon: Lennette Bihari, MD;  Location: [facility] INVASIVE CV LAB;  Service: Cardiovascular;  Laterality: N/A;   FOOT FRACTURE SURGERY Bilateral 2004-2010   "got pins in both of them"   HEMORRHOID SURGERY N/A 06/15/2015   Procedure: HEMORRHOIDECTOMY;  Surgeon: Almond Lint, MD;  Location: [facility] OR;  Service: General;  Laterality: N/A;   INGUINAL HERNIA REPAIR Right ~ 1996  INSERTION OF DIALYSIS CATHETER N/A 09/09/2020   Procedure: INSERTION OF TUNNELED DIALYSIS PALINDROME 19cm CATHETER;  Surgeon: Leonie Douglas, MD;  Location: [facility] OR;  Service: Vascular;  Laterality: N/A;   LEFT HEART CATH AND CORONARY ANGIOGRAPHY N/A 02/05/2021   Procedure: LEFT HEART CATH AND CORONARY ANGIOGRAPHY;  Surgeon: Laurey Morale, MD;  Location: [facility] INVASIVE CV LAB;  Service: Cardiovascular;  Laterality: N/A;   LEFT HEART CATH AND CORONARY ANGIOGRAPHY N/A 01/22/2023   Procedure: LEFT HEART CATH AND CORONARY ANGIOGRAPHY;  Surgeon: [place], Peter M, MD;  Location: [facility] INVASIVE CV LAB;  Service: Cardiovascular;  Laterality: N/A;   LEFT HEART CATHETERIZATION WITH CORONARY ANGIOGRAM N/A 06/21/2014   Procedure: LEFT HEART CATHETERIZATION WITH CORONARY ANGIOGRAM;  Surgeon: Laurey Morale, MD;  Location: [facility] CATH LAB;  Service: Cardiovascular;  Laterality: N/A;   LOOP RECORDER INSERTION N/A  06/19/2016   Procedure: Loop Recorder Insertion;  Surgeon: [facility] Salvia, MD;  Location: [facility] INVASIVE CV LAB;  Service: Cardiovascular;  Laterality: N/A;   PERIPHERAL VASCULAR BALLOON ANGIOPLASTY  03/07/2021   Procedure: PERIPHERAL VASCULAR BALLOON ANGIOPLASTY;  Surgeon: Chuck Hint, MD;  Location: [facility] INVASIVE CV LAB;  Service: Cardiovascular;;   PERIPHERAL VASCULAR BALLOON ANGIOPLASTY  07/18/2021   Procedure: PERIPHERAL VASCULAR BALLOON ANGIOPLASTY;  Surgeon: Chuck Hint, MD;  Location: [facility] INVASIVE CV LAB;  Service: Cardiovascular;;   PERIPHERAL VASCULAR INTERVENTION Left 12/25/2022   Procedure: PERIPHERAL VASCULAR INTERVENTION;  Surgeon: Leonie Douglas, MD;  Location: [facility] INVASIVE CV LAB;  Service: Cardiovascular;  Laterality: Left;  stent to brachiocephalic vein   PERIPHERAL VASCULAR ULTRASOUND/IVUS Left 12/25/2022   Procedure: Peripheral Vascular Ultrasound/IVUS;  Surgeon: Leonie Douglas, MD;  Location: [facility] INVASIVE CV LAB;  Service: Cardiovascular;  Laterality: Left;   TEE WITHOUT CARDIOVERSION N/A 03/12/2016   Procedure: TRANSESOPHAGEAL ECHOCARDIOGRAM (TEE);  Surgeon: Thurmon Fair, MD;  Location: [facility] ENDOSCOPY;  Service: Cardiovascular;  Laterality: N/A;   TEE WITHOUT CARDIOVERSION N/A 10/16/2020   Procedure: TRANSESOPHAGEAL ECHOCARDIOGRAM (TEE);  Surgeon: Laurey Morale, MD;  Location: [facility] ENDOSCOPY;  Service: Cardiovascular;  Laterality: N/A;   TRANSESOPHAGEAL ECHOCARDIOGRAM (CATH LAB) N/A 01/13/2023   Procedure: TRANSESOPHAGEAL ECHOCARDIOGRAM;  Surgeon: Quintella Reichert, MD;  Location: [facility] INVASIVE CV LAB;  Service: Cardiovascular;  Laterality: N/A;   UPPER EXTREMITY VENOGRAPHY Right 07/18/2021   Procedure: UPPER EXTREMITY VENOGRAPHY;  Surgeon: Chuck Hint, MD;  Location: [facility] INVASIVE CV LAB;  Service: Cardiovascular;  Laterality: Right;    Social History  reports that he has been smoking cigarettes. He has a 5.8 pack-year smoking history. He has been exposed  to tobacco smoke. He has never used smokeless tobacco. He reports that he does not currently use alcohol after a past usage of about 1.0 standard drink of alcohol per week. He reports current drug use. Drug: Marijuana.  Allergies  Allergen Reactions   Clonidine Derivatives Nausea And Vomiting and Other (See Comments)    Tablets by mouth = Tolerated  Patches on the skin = Nausea & vomiting after wearing 4-5 days    Imdur [Isosorbide Nitrate] Other (See Comments)    Headaches -- patient instructed to continue taking    Tramadol Other (See Comments)    Headaches    Clonidine Nausea And Vomiting    Family History  Problem Relation Age of Onset   Hypertension Mother    Hypertension Other    Colon cancer Paternal Uncle    Stroke Maternal Aunt    Heart attack Brother    Hypertension  Brother    Diabetes Maternal Aunt    Lung cancer Maternal Uncle    Stroke Maternal Uncle    Other Sister        "breathing machine at night"   Headache Sister    Thyroid disease Sister    Throat cancer Neg Hx    Pancreatic cancer Neg Hx    Esophageal cancer Neg Hx    Kidney disease Neg Hx    Liver disease Neg Hx   Reviewed on admission  Prior to Admission medications   Medication Sig Start Date End Date Taking? Authorizing Provider  acetaminophen (TYLENOL) 500 MG tablet Take 1,000 mg by mouth every 6 (six) hours as needed for mild pain (pain score 1-3), moderate pain (pain score 4-6) or headache.   Yes [provider]  albuterol (VENTOLIN HFA) 108 (90 Base) MCG/ACT inhaler Inhale 1 puff into the lungs every 6 (six) hours as needed for wheezing or shortness of breath. 05/24/21  Yes [provider]  amLODipine (NORVASC) 10 MG tablet TAKE 1 TABLET(10 MG) BY MOUTH DAILY 07/15/21  Yes Laurey Morale, MD  aspirin EC 81 MG EC tablet Take 1 tablet (81 mg total) by mouth daily. 12/12/17  Yes [facility], Roe Rutherford, PA  carvedilol (COREG) 12.5 MG tablet Take 1 tablet (12.5 mg total) by mouth 2  (two) times daily. 02/01/23 05/02/23 Yes Milford, Anderson Malta, FNP  cephALEXin (KEFLEX) 500 MG capsule Take 2 capsules (1,000 mg total) by mouth daily. Daily at night time, start from 12/9 after completion of IV course. 02/17/23  Yes Odette Fraction, MD  cloNIDine (CATAPRES) 0.2 MG tablet Take 0.2 mg by mouth 2 (two) times daily.   Yes [provider]  Evolocumab (REPATHA SURECLICK) 140 MG/ML SOAJ Inject 140 mg into the skin every 14 (fourteen) days. 10/28/22  Yes Hilty, Lisette Abu, MD  hydrALAZINE (APRESOLINE) 100 MG tablet Take 0.5 tablets (50 mg total) by mouth 3 (three) times daily. 01/14/23  Yes Burnadette Pop, MD  HYDROcodone-acetaminophen (NORCO/VICODIN) 5-325 MG tablet Take 1 tablet by mouth every 8 (eight) hours as needed. 01/29/23  Yes [provider]  lidocaine 4 % Place 1 patch onto the skin daily. Apply to lower back   Yes [provider]  lidocaine-prilocaine (EMLA) cream Apply 1 application. topically as needed (prior to port being accessed). Use on dialysis days (Tues, Thurs and Sat)   Yes [provider]  meclizine (ANTIVERT) 25 MG tablet Take 1 tablet (25 mg total) by mouth 3 (three) times daily as needed for dizziness. 12/21/22  Yes Vanetta Mulders, MD  nitroGLYCERIN (NITROSTAT) 0.4 MG SL tablet Place 1 tablet (0.4 mg total) under the tongue every 5 (five) minutes as needed for chest pain. 01/14/23  Yes Burnadette Pop, MD  oxyCODONE-acetaminophen (PERCOCET) 5-325 MG tablet Take 1 tablet by mouth every 4 (four) hours as needed for severe pain. 12/25/22 12/25/23 Yes Leonie Douglas, MD  pantoprazole (PROTONIX) 40 MG tablet Take 1 tablet (40 mg total) by mouth daily. 01/23/23  Yes Marinda Elk, MD  polyethylene glycol powder [facility]) 17 GM/SCOOP powder Take 17 g by mouth daily. Patient taking differently: Take 17 g by mouth daily as needed for moderate constipation. 01/14/23  Yes Burnadette Pop, MD  STIOLTO RESPIMAT 2.5-2.5 MCG/ACT  AERS Inhale 2 puffs into the lungs at bedtime. 12/29/22  Yes [provider]    Physical Exam: Vitals:   04/14/23 1201 04/14/23 1545 04/14/23 1600 04/14/23 1647  BP: (!) 150/97 (!) 146/95 [name])  146/100 (!) 146/100  Pulse: 87 86 83 88  Resp: 17 (!) 26 15   Temp: 97.6 F (36.4 C)  97.6 F (36.4 C)   TempSrc:   Oral   SpO2: 100% 100% 99%     Physical Exam Constitutional:      General: He is not in acute distress.    Appearance: Normal appearance.  HENT:     Head: Normocephalic and atraumatic.     Mouth/Throat:     Mouth: Mucous membranes are moist.     Pharynx: Oropharynx is clear.  Eyes:     Extraocular Movements: Extraocular movements intact.     Pupils: Pupils are equal, round, and reactive to light.  Cardiovascular:     Rate and Rhythm: Normal rate and regular rhythm.     Pulses: Normal pulses.     Heart sounds: Murmur heard.  Pulmonary:     Effort: Pulmonary effort is normal. No respiratory distress.     Breath sounds: Normal breath sounds.  Abdominal:     General: Bowel sounds are normal. There is no distension.     Palpations: Abdomen is soft.     Tenderness: There is no abdominal tenderness.  Musculoskeletal:        General: No swelling or deformity.  Skin:    General: Skin is warm and dry.  Neurological:     General: No focal deficit present.     Mental Status: Mental status is at baseline.    Labs on Admission: I have personally reviewed following labs and imaging studies  CBC: Recent Labs  Lab 04/14/23 1020  WBC 6.1  HGB 13.6  HCT 42.9  MCV 96.6  PLT 89*    Basic Metabolic Panel: Recent Labs  Lab 04/14/23 1020  NA 138  K 3.4*  CL 96*  CO2 24  GLUCOSE 104*  BUN 41*  CREATININE 6.62*  CALCIUM 10.0    GFR: CrCl cannot be calculated (Unknown ideal weight.).  Liver Function Tests: No results for input(s): "AST", "ALT", "ALKPHOS", "BILITOT", "PROT", "ALBUMIN" in the last 168 hours.  Urine analysis:    Component Value  Date/Time   COLORURINE YELLOW 03/02/2023 0200   APPEARANCEUR CLEAR 03/02/2023 0200   LABSPEC 1.014 03/02/2023 0200   PHURINE 5.0 03/02/2023 0200   GLUCOSEU 50 (A) 03/02/2023 0200   HGBUR NEGATIVE 03/02/2023 0200   HGBUR negative 02/18/2009 0901   BILIRUBINUR NEGATIVE 03/02/2023 0200   KETONESUR NEGATIVE 03/02/2023 0200   PROTEINUR >=300 (A) 03/02/2023 0200   UROBILINOGEN 0.2 01/02/2015 1945   NITRITE NEGATIVE 03/02/2023 0200   LEUKOCYTESUR NEGATIVE 03/02/2023 0200    Radiological Exams on Admission: CT Angio Chest/Abd/Pel for Dissection W and/or Wo Contrast Result Date: 04/14/2023 CLINICAL DATA:  Chest pain radiating to the back. Acute aortic syndrome suspected. EXAM: CT ANGIOGRAPHY CHEST, ABDOMEN AND PELVIS TECHNIQUE: Non-contrast CT of the chest was initially obtained. Multidetector CT imaging through the chest, abdomen and pelvis was performed using the standard protocol during bolus administration of intravenous contrast. Multiplanar reconstructed images and MIPs were obtained and reviewed to evaluate the vascular anatomy. RADIATION DOSE REDUCTION: This exam was performed according to the departmental dose-optimization program which includes automated exposure control, adjustment of the mA and/or kV according to patient size and/or use of iterative reconstruction technique. CONTRAST:  75mL OMNIPAQUE IOHEXOL 350 MG/ML SOLN COMPARISON:  CT dated 03/02/2023. FINDINGS: Evaluation of this exam is limited due to respiratory motion and streak artifact caused by patient's arms. Evaluation is also limited due to  loss of intra-abdominal fat and cachexia. CTA CHEST FINDINGS Cardiovascular: Stable cardiomegaly. No pericardial effusion. Coronary vascular calcification. Mechanical aortic valve. Postsurgical changes of the repair of the ascending aorta and endovascular stent graft repair of the thoracic aorta similar to prior CT. The stent graft appears patent. No contrast noted outside of the confines of the  stent graft. No periaortic fluid collection or hematoma. The origins of the great vessels of the aortic arch appear patent as visualized. The central pulmonary arteries appear patent. Mediastinum/Nodes: No hilar or mediastinal adenopathy. The esophagus is grossly unremarkable. No mediastinal fluid collection. Lungs/Pleura: Trace left pleural effusion. No focal consolidation, or pneumothorax. The central airways are patent. Musculoskeletal: No acute osseous pathology. Left anterior chest wall implantable cardiac device. Median sternotomy wires. Review of the MIP images confirms the above findings. CTA ABDOMEN AND PELVIS FINDINGS VASCULAR Aorta: Similar appearance of postsurgical changes of the upper abdominal aorta with irregularity of the wall. No aortic dissection. A 4 cm distal abdominal aortic aneurysm similar to prior CT. A 3.3 x 3.4 cm thrombosed aneurysm sac from the proximal abdominal aorta similar to prior CT. Celiac: Postsurgical changes adjacent to the celiac trunk. The celiac artery and its major branches appear patent. SMA: The SMA is patent. Renals: The renal arteries appear patent. IMA: The IMA is patent. Inflow: Moderate atherosclerotic calcification. 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 No dissection. The iliac arteries are patent. Veins: The IVC is unremarkable.  No portal venous gas. Review of the MIP images confirms the above findings. NON-VASCULAR No intra-abdominal free air.  Small free fluid in the pelvis. Hepatobiliary: Small cyst in the dome of the liver. No biliary duct dilatation. Small calcified gallstone. No pericholecystic fluid. Pancreas: The pancreas is unremarkable as visualized. Spleen: The spleen is unremarkable Adrenals/Urinary Tract: The adrenal glands are unremarkable. There is no hydronephrosis on either side. There is hypoenhancement of the left renal parenchyma suggestive of hypoperfusion. There is mild  left renal parenchyma atrophy. There is no hydronephrosis on either side. The urinary bladder is grossly unremarkable. Stomach/Bowel: Evaluation of the bowel is limited in the absence of oral contrast and due to factors above. No bowel obstruction. The appendix is unremarkable as visualized. Lymphatic: No obvious adenopathy. Reproductive: The prostate and seminal vesicles are grossly remarkable. Other: Loss of subcutaneous fat and cachexia. Musculoskeletal: Degenerative changes of the spine. No acute osseous pathology. Review of the MIP images confirms the above findings. IMPRESSION: 1. No acute intrathoracic, abdominal, or pelvic pathology. No aortic dissection. 2. Stable postsurgical changes of the repair of the ascending aorta and endovascular stent graft repair of the thoracic aorta. 3. A 4 cm distal abdominal aortic aneurysm similar to prior CT. Recommend follow-up CT or MR as appropriate in 12 months and referral to or continued care with vascular specialist. (Ref.: J Vasc Surg. 2018; 67:2-77 and J Am Coll Radiol 2013;10(10):789-794.) 4. 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 5. Cholelithiasis. 6. Hypoperfusion of the left kidney.  No hydronephrosis. Electronically Signed   By: Elgie Collard M.D.   On: 04/14/2023 15:42   DG Chest 2 View Result Date: 04/14/2023 CLINICAL DATA:  Chest pain. EXAM: CHEST - 2 VIEW COMPARISON:  03/02/2023. FINDINGS: Bilateral lung fields are clear. Bilateral costophrenic angles are clear. Stable cardio-mediastinal silhouette. Sternotomy wires, loop recorder device and vascular stents again noted. No acute osseous abnormalities.  The soft tissues are within normal limits. IMPRESSION: No active cardiopulmonary disease. Electronically Signed   By: Jules Schick M.D.   On: 04/14/2023 11:40   EKG: Independently reviewed.  Sinus rhythm at 83 bpm.  Evidence of LVH with repolarization abnormality.  Nonspecific T  wave changes.  Assessment/Plan Principal Problem:   NSTEMI (non-ST elevated myocardial infarction) (HCC)   NSTEMI CAD > History of NSTEMI and prior stenting.  Last catheter 2022 showed nonobstructive disease other than a small first diagonal with diffuse severe disease. > Coming in with chest pain and no evidence of dissection or change in aortic aneurysms.  Troponin trend 400, 4234.  EKG appears stable. > Cardiology's been consulted, started on heparin, plan for cath tomorrow. > History of prosthetic valve status post infection and pseudoaneurysm as below as well. > Has received aspirin, morphine, Coreg, nitro, heparin infusion. - Monitor on progressive overnight - Appreciate cardiology recommendations and assistance - Continue heparin infusion - Continue ASA, Coreg - Continue Plavix  Aortic valve stenosis Prosthetic valve endocarditis Pseudoaneurysm > Patient has known history of aortic valve stenosis status post bioprosthetic aortic valve replacement in 2016.  Patient had completed 6 weeks of IV cefazolin for possible prosthetic valve endocarditis last month and was transition to cephalexin which he is now on indefinitely > Deemed not a candidate for further aortic valve replacement. > Echo last month showed degeneration of the prosthetic aortic valve and large pseudoaneurysm with aortic stenosis. - Appreciate cardiology recommendations and assistance - Continue to monitor  Hypertension - Continue home amlodipine, carvedilol, clonidine, hydralazine  Hyperlipidemia - On Repatha outpatient  History of CVA - Continue ASA - On Plavix as above - On Repatha outpatient   ESRD on HD Hyperkalemia > Last dialysis session yesterday.  Creatinine currently stable at 6.62.  Potassium 3.4. - Consult nephrology - Trend renal function and electrolytes  GERD - Continue PPI  Chronic combined systolic and diastolic CHF > Most recent echo showed EF 55-60%, G1 DD, mildly reduced RV  function.  Also noted were above valve changes. - Fluid management with dialysis - Continue home carvedilol  COPD - Replace home Stiolto formulary Brovana and Incruse - As needed albuterol  History of IJ thrombosis - Noted  History of thoracoabdominal aortic dissection s/p repair - Noted  DVT prophylaxis: Heparin Code Status:   Full Family Communication:  Updated at bedside  Disposition Plan:   Patient is from:  Home  Anticipated DC to:  Home  Anticipated DC date:  1 to 5 days  Anticipated DC barriers: None  Consults called:  Cardiology Admission status:  Observation, progressive  Severity of Illness: The appropriate patient status for this patient is OBSERVATION. Observation status is judged to be reasonable and necessary in order to provide the required intensity of service to ensure the patient's safety. The patient's presenting symptoms, physical exam findings, and initial radiographic and laboratory data in the context of their medical condition is felt to place them at decreased risk for further clinical deterioration. Furthermore, it is anticipated that the patient will be medically stable for discharge from the hospital within 2 midnights of admission.    Synetta Fail MD [facility]  How to contact the [facility] Attending or Consulting provider 7A - 7P or covering provider during after hours 7P -7A, for this patient?   Check the care team in [facility] and look for a) attending/consulting [facility] provider listed and b) the [facility] team listed Log into [email or web address] and use [employer]'s universal  password to access. If you do not have the password, please contact the hospital operator. Locate the [facility] provider you are looking for under [facility] and page to a number that you can be directly reached. If you still have difficulty reaching the provider, please page the [facility] (Director on Call) for the Hospitalists listed on amion for assistance.  04/14/2023, 5:16 PM

## 2023-04-14 NOTE — ED Notes (Signed)
[facility] CALLED

## 2023-04-14 NOTE — ED Notes (Signed)
Pt acuity increased due to troponin result. Charge RN aware of patient. PA meredith aware of troponin.

## 2023-04-14 NOTE — ED Triage Notes (Signed)
Pt bib ems with chest pain. Sudden onset central cp radiating to his back. Reports blockage in heart, but unable to stent due to anuerism at the site. Pt on abx for infection in valve of heart. Pain not relieved with nitroglycerin  X3. Given 324mg  asa pta.  140/90 HR 80 98% RA  CBG 12

## 2023-04-14 NOTE — ED Provider Notes (Signed)
[place] EMERGENCY DEPARTMENT AT [facility] Provider Note   CSN: [id number] Arrival date & time: 04/14/23  1308     History  No chief complaint on file.   JEREL SARDINA is a 53 y.o. male with history of hypertension, cardiomyopathy, chronic systolic and diastolic heart failure, NSTEMI, CAD s/p CABG, ESRD on hemodialysis, thoracic aneurysm s/p repair, aortic valve replacement with prosthetic valve, prior MSSA bacteremia, who presents to the emergency department complaining of chest pain.  Patient states that he was taking a shower and when he bent over he had sudden onset chest pain in the left side of the chest and radiating to his back.  He tried to lay down and thought that it would resolve on its own, and it continued to get worse.  States pain is worse with deep breathing and to palpation of the chest as well as the back.  Denies any cough, nasal congestion, fever, trauma, leg pain or swelling.  HPI     Home Medications Prior to Admission medications   Medication Sig Start Date End Date Taking? Authorizing Provider  acetaminophen (TYLENOL) 500 MG tablet Take 1,000 mg by mouth every 6 (six) hours as needed for mild pain (pain score 1-3), moderate pain (pain score 4-6) or headache.    [provider]  albuterol (VENTOLIN HFA) 108 (90 Base) MCG/ACT inhaler Inhale 1 puff into the lungs every 6 (six) hours as needed for wheezing or shortness of breath. 05/24/21   [provider]  amLODipine (NORVASC) 10 MG tablet TAKE 1 TABLET(10 MG) BY MOUTH DAILY 07/15/21   Laurey Morale, MD  aspirin EC 81 MG EC tablet Take 1 tablet (81 mg total) by mouth daily. 12/12/17   [facility], Roe Rutherford, PA  carvedilol (COREG) 12.5 MG tablet Take 1 tablet (12.5 mg total) by mouth 2 (two) times daily. 02/01/23 05/02/23  Jacklynn Ganong, FNP  cephALEXin (KEFLEX) 500 MG capsule Take 2 capsules (1,000 mg total) by mouth daily. Daily at night time, start from 12/9 after completion of IV  course. Patient taking differently: Take 1,000 mg by mouth at bedtime. 02/17/23   Odette Fraction, MD  cloNIDine (CATAPRES) 0.2 MG tablet Take 0.2 mg by mouth 2 (two) times daily.    [provider]  Evolocumab (REPATHA SURECLICK) 140 MG/ML SOAJ Inject 140 mg into the skin every 14 (fourteen) days. 10/28/22   Hilty, Lisette Abu, MD  hydrALAZINE (APRESOLINE) 100 MG tablet Take 0.5 tablets (50 mg total) by mouth 3 (three) times daily. 01/14/23   Burnadette Pop, MD  HYDROcodone-acetaminophen (NORCO/VICODIN) 5-325 MG tablet Take 1 tablet by mouth every 8 (eight) hours as needed. 01/29/23   [provider]  lidocaine 4 % Place 1 patch onto the skin daily. Apply to lower back    [provider]  lidocaine-prilocaine (EMLA) cream Apply 1 application. topically as needed (prior to port being accessed). Use on dialysis days (Tues, Thurs and Sat)    [provider]  meclizine (ANTIVERT) 25 MG tablet Take 1 tablet (25 mg total) by mouth 3 (three) times daily as needed for dizziness. 12/21/22   Vanetta Mulders, MD  nitroGLYCERIN (NITROSTAT) 0.4 MG SL tablet Place 1 tablet (0.4 mg total) under the tongue every 5 (five) minutes as needed for chest pain. 01/14/23   Burnadette Pop, MD  oxyCODONE-acetaminophen (PERCOCET) 5-325 MG tablet Take 1 tablet by mouth every 4 (four) hours as needed for severe pain. 12/25/22 12/25/23  Leonie Douglas, MD  pantoprazole (PROTONIX) 40 MG tablet Take 1 tablet (40 mg total) by mouth daily. 01/23/23   Marinda Elk, MD  polyethylene glycol powder (GLYCOLAX/MIRALAX) 17 GM/SCOOP powder Take 17 g by mouth daily. 01/14/23   Burnadette Pop, MD  STIOLTO RESPIMAT 2.5-2.5 MCG/ACT AERS Inhale 2 puffs into the lungs at bedtime. 12/29/22   [provider]      Allergies    Clonidine derivatives, Imdur [isosorbide nitrate], Tramadol, and Clonidine    Review of Systems   Review of Systems  Cardiovascular:  Positive for chest pain.   Musculoskeletal:  Positive for back pain.  All other systems reviewed and are negative.   Physical Exam Updated Vital Signs BP (!) 150/97   Pulse 87   Temp 97.6 F (36.4 C)   Resp 17   SpO2 100%  Physical Exam Vitals and nursing note reviewed.  Constitutional:      Appearance: Normal appearance.  HENT:     Head: Normocephalic and atraumatic.  Eyes:     Conjunctiva/sclera: Conjunctivae normal.  Cardiovascular:     Rate and Rhythm: Normal rate and regular rhythm.     Heart sounds: Murmur heard.     Systolic murmur is present.  Pulmonary:     Effort: Pulmonary effort is normal. No respiratory distress.     Breath sounds: Normal breath sounds.  Abdominal:     General: There is no distension.     Palpations: Abdomen is soft.     Tenderness: There is no abdominal tenderness.  Skin:    General: Skin is warm and dry.  Neurological:     General: No focal deficit present.     Mental Status: He is alert.    ED Results / Procedures / Treatments   Labs (all labs ordered are listed, but only abnormal results are displayed) Labs Reviewed  BASIC METABOLIC PANEL - Abnormal; Notable for the following components:      Result Value   Potassium 3.4 (*)    Chloride 96 (*)    Glucose, Bld 104 (*)    BUN 41 (*)    Creatinine, Ser 6.62 (*)    GFR, Estimated 9 (*)    Anion gap 18 (*)    All other components within normal limits  CBC - Abnormal; Notable for the following components:   Platelets 89 (*)    All other components within normal limits  TROPONIN I (HIGH SENSITIVITY) - Abnormal; Notable for the following components:   Troponin I (High Sensitivity) 400 (*)    All other components within normal limits  TROPONIN I (HIGH SENSITIVITY) - Abnormal; Notable for the following components:   Troponin I (High Sensitivity) 4,234 (*)    All other components within normal limits  CBG MONITORING, ED    EKG EKG Interpretation Date/Time:  Wednesday April 14 2023 10:40:42  EST Ventricular Rate:  83 PR Interval:  168 QRS Duration:  98 QT Interval:  402 QTC Calculation: 472 R Axis:   34  Text Interpretation: Normal sinus rhythm Biatrial enlargement Left ventricular hypertrophy with repolarization abnormality ( Sokolow-Lyon , Cornell product , Romhilt-Estes ) Cannot rule out Septal infarct , age undetermined Abnormal ECG Confirmed by Gerhard Munch [phone]) on 04/14/2023 10:44:26 AM  Radiology DG Chest 2 View Result Date: 04/14/2023 CLINICAL DATA:  Chest pain. EXAM: CHEST - 2 VIEW COMPARISON:  03/02/2023. FINDINGS: Bilateral lung fields are clear. Bilateral costophrenic angles are clear. Stable cardio-mediastinal silhouette. Sternotomy wires, loop recorder device and vascular stents again noted. No  acute osseous abnormalities. The soft tissues are within normal limits. IMPRESSION: No active cardiopulmonary disease. Electronically Signed   By: Jules Schick M.D.   On: 04/14/2023 11:40    Procedures .Critical Care  Performed by: Su Monks, PA-C Authorized by: Su Monks, PA-C   Critical care provider statement:    Critical care time (minutes):  30   Critical care was necessary to treat or prevent imminent or life-threatening deterioration of the following conditions:  Cardiac failure   Critical care was time spent personally by me on the following activities:  Development of treatment plan with patient or surrogate, discussions with consultants, evaluation of patient's response to treatment, examination of patient, ordering and review of laboratory studies, ordering and review of radiographic studies, ordering and performing treatments and interventions, pulse oximetry, re-evaluation of patient's condition and review of old charts   I assumed direction of critical care for this patient from another provider in my specialty: no     Care discussed with: admitting provider   Comments:     NSTEMI requiring heparin     Medications Ordered in  ED Medications  HYDROmorphone (DILAUDID) injection 0.5 mg (0.5 mg Intravenous Given 04/14/23 1043)  morphine (PF) 4 MG/ML injection 4 mg (4 mg Intravenous Given 04/14/23 1335)  iohexol (OMNIPAQUE) 350 MG/ML injection 75 mL (75 mLs Intravenous Contrast Given 04/14/23 1422)  HYDROmorphone (DILAUDID) injection 1 mg (1 mg Intravenous Given 04/14/23 1446)  nitroGLYCERIN (NITROGLYN) 2 % ointment 1 inch (1 inch Topical Given 04/14/23 1453)    ED Course/ Medical Decision Making/ A&P Clinical Course as of 04/14/23 1509  Wed Apr 14, 2023  1243 Lab called - Trop 400's [SM]    Clinical Course User Index [SM] Dorthy Cooler, PA-C                                 Medical Decision Making Amount and/or Complexity of Data Reviewed Radiology: ordered.  Risk Prescription drug management.   This patient is a 53 y.o. male  who presents to the ED for concern of chest and back pain.   Differential diagnoses prior to evaluation: The emergent differential diagnosis includes, but is not limited to,  ACS, pericarditis, myocarditis, aortic dissection, PE, pneumothorax, esophageal spasm or rupture, chronic angina, pneumonia, bronchitis, GERD, reflux/PUD, biliary disease, pancreatitis, costochondritis, anxiety. This is not an exhaustive differential.   Past Medical History / Co-morbidities / Social History: hypertension, cardiomyopathy, chronic systolic and diastolic heart failure, NSTEMI, CAD s/p CABG, ESRD on hemodialysis, thoracic aneurysm s/p repair, aortic valve replacement with prosthetic valve, prior MSSA bacteremia  Additional history: Chart reviewed. Pertinent results include: Extensive review of recent cardiology follow-ups with [facility] as well as recent admissions.  Physical Exam: Physical exam performed. The pertinent findings include: Hypertensive, otherwise normal vital signs.  Very uncomfortable on exam.  Systolic murmur noted.  Lung sounds clear.  Lab Tests/Imaging studies: I personally  interpreted labs/imaging and the pertinent results include: Platelets of 89, otherwise CBC normal.  BMP with stable creatinine compared to prior, consistent with ESRD on hemodialysis.  Initial troponin 400, delta troponin 4234.  X-ray without acute abnormalities.  CT dissection study pending at time of shift change.   Cardiac monitoring: EKG obtained and interpreted by myself and attending physician which shows: NSR, LVH, no acute ischemic findings   Medications: I ordered medication including Dilaudid, morphine, Nitropaste.  I have reviewed the patients home medicines and  have made adjustments as needed.  Consultations obtained: I consulted with cardiology team Rosann Auerbach, triage nurse) who will consult on patient once admitted. They will not be primary team for admission due to patient's ESRD/hemodialysis status.    Disposition: Patient discussed and care transferred to [facility] t shift change. Please see his/her note for further details regarding further ED course and disposition. Plan at time of handoff is plan for admission once CTA has resulted.  If CTA concerning for dissection, anticipate consulting with vascular/cardiothoracic surgery.  Spaete if CT without findings of dissection, plan for heparinization and admission for treatment of NSTEMI.  Final Clinical Impression(s) / ED Diagnoses Final diagnoses:  NSTEMI (non-ST elevated myocardial infarction) [facility])    Rx / DC Orders ED Discharge Orders     None      Portions of this report may have been transcribed using voice recognition software. Every effort was made to ensure accuracy; however, inadvertent computerized transcription errors may be present.    Jeanella Flattery 04/14/23 1510    Virgina Norfolk, DO 04/14/23 1534

## 2023-04-14 NOTE — ED Notes (Signed)
This paramedic took patient to CT

## 2023-04-14 NOTE — ED Notes (Signed)
Pt had one small run of afib and then back to NSR. EDP aware

## 2023-04-14 NOTE — ED Provider Notes (Signed)
6 hrs CP. NSTEMI vs dissection. Pending CT  If no dissection admit to medicine on heparin and cards will consult Physical Exam  BP (!) 146/100   Pulse 88   Temp 97.6 F (36.4 C) (Oral)   Resp 15   SpO2 99%   Physical Exam Vitals and nursing note reviewed.  Constitutional:      General: He is not in acute distress.    Appearance: Normal appearance.  HENT:     Head: Atraumatic.  Pulmonary:     Effort: Pulmonary effort is normal.  Neurological:     General: No focal deficit present.     Mental Status: He is alert.  Psychiatric:        Mood and Affect: Mood normal.        Behavior: Behavior normal.     Procedures  Procedures  ED Course / MDM   Clinical Course as of 04/14/23 1711  Wed Apr 14, 2023  1243 Lab called - Trop 400's [SM]    Clinical Course User Index [SM] Dorthy Cooler, PA-C   Medical Decision Making Amount and/or Complexity of Data Reviewed Radiology: ordered.  Risk OTC drugs. Prescription drug management. Decision regarding hospitalization.     Patient received in signout.  In short this is a 53 year old male presenting with 6 hours of chest pain.  States this came on while he was in the shower.  He does have extensive history of cardiac disease including HTN, systolic and diastolic heart failure, NSTEMI, previous thoracic aortic aneurysm s/p repair, aortic valve stenosis with repair.   He underwent CTA of the chest which revealed no dissections.  His troponin here trended from 400 up to 4000. No ST changes on EKG. Suspect NSTEMI.  Cardiology was consulted by the previous team and they will plan to see patient in consult and request patient be admitted to the hospital service.   I reevaluated patient after he received his Dilaudid and nitroglycerin, he reports his pain is mild right now, much better than upon his arrival.  I ordered chewable aspirin and heparin per pharmacy for his NSTEMI.  Consulted hospitalist Dr. Alinda Money, will plan on  admitting for further management of NSTEMI     Arabella Merles, Cordelia Poche 04/14/23 1711    Alvira Monday, MD 04/19/23 1121

## 2023-04-14 NOTE — Progress Notes (Signed)
PHARMACY - ANTICOAGULATION CONSULT NOTE  Pharmacy Consult for heparin Indication: chest pain/ACS  Allergies  Allergen Reactions   Clonidine Derivatives Nausea And Vomiting and Other (See Comments)    Tablets by mouth = Tolerated  Patches on the skin = Nausea & vomiting after wearing 4-5 days    Imdur [Isosorbide Nitrate] Other (See Comments)    Headaches -- patient instructed to continue taking    Tramadol Other (See Comments)    Headaches    Clonidine Nausea And Vomiting    Patient Measurements:   Heparin Dosing Weight: TBW  Vital Signs: Temp: 97.6 F (36.4 C) (01/29 1600) Temp Source: Oral (01/29 1600) BP: 146/100 (01/29 1600) Pulse Rate: 83 (01/29 1600)  Labs: Recent Labs    04/14/23 1020 04/14/23 1344  HGB 13.6  --   HCT 42.9  --   PLT 89*  --   CREATININE 6.62*  --   TROPONINIHS 400* 4,234*    CrCl cannot be calculated (Unknown ideal weight.).   Medical History: Past Medical History:  Diagnosis Date   Adenomatous colon polyp 08/2015   Anxiety    Aortic disease (HCC)    Aortic dissection (HCC)    a. admx 04/2014 >> L renal infarct; a/c renal failure >> b.  s/p Bioprosthetic Bent[place] and total arch replacement and staged endovascular repair of descending aortic aneurysm ([facility] - Dr. Kizzie Bane)   CAD (coronary artery disease)    a. LHC 4/16:  oD1 60%   Cardiomyopathy (HCC)    a. non-ischemic - probably related to untreated HTN and ETOH abuse - Echo 3/13 with EF 35-40% >> b. Echo 4/16: Severe LVH, EF 55-60%, moderate AI, moderate MR, mild LAE, trivial effusion, known type B dissection with communication between true and false lumens with suprasternal images suggesting dissection plane may propagate to at least left subclavian takeoff, root above aortic valve okay     Chronic abdominal pain    Chronic combined systolic and diastolic congestive heart failure (HCC)    a. 05/2011: Adm with pulm edema/HTN urgency, EF 35-40% with diffuse hypokinesis and moderate to  severe mitral regurgitation. Cardiomyopathy likely due to uncontrolled HTN and ETOH abuse - cath deferred due to renal insufficiency (felt due to uncontrolled HTN). bElzie Rings MV 06/2011: EF 37% and no ischemia or infarction. c. EF 45-50% by echo 01/2012.   Chronic sinusitis    DDD (degenerative disc disease), lumbar    Depression    Descending thoracic aortic aneurysm (HCC)    Dissecting aneurysm of thoracic aorta (HCC)    ESRD (end stage renal disease) (HCC)    on dialysis 1-2 days per week (2 days ordered, only coming 1 day per week as of oct 2024). on dialysis 3-4 years total   ETOH abuse    a. Reported to have quit 05/2011.   Frequent headaches    GERD (gastroesophageal reflux disease)    Headache(784.0)    "q other day" (08/08/2013)   Hemorrhoid thrombosis    History of echocardiogram    Echo 1/17:  Severe LVH, EF 55-60%, no RWMA, Gr 2 DD, AVR ok, mild to mod MR, mild LAE, mild reduced RVSF, mod RAE   History of medication noncompliance    HYPERLIPIDEMIA    Hypertension    a. Hx of HTN urgency secondary to noncompliance. b. urinary metanephrine and catecholeamine levels normal 2013.  c. Renal art [name] 1/16:  No evidence of renal artery stenosis noted bilaterally.   INGUINAL HERNIA    Pneumonia ~  2013   Serrated adenoma of colon 08/2015   Stroke [facility])    Tobacco abuse    Valvular heart disease    a. Echo 05/2011: moderate to severe eccentric MR and mild to moderate AI with prolapsing left coronary cusp. b. Echo 01/2012: mild-mod AI, mild dilitation of aortic root, mild MR.;  c. Echo 1/16: Severe LVH consistent with hypertrophic cardio myopathy, EF 50%, no RWMA, mod AI, mild MR, mild RAE, dilated Ao root (40 mm);     Assessment: 52 YOM presenting with CP, elevated troponin, he is not on anticoagulation PTA  Goal of Therapy:  Heparin level 0.3-0.7 units/ml Monitor platelets by anticoagulation protocol: Yes   Plan:  Heparin 4000 units IV x 1, and gtt at 900 units/hr F/u 8 hour  heparin level F/u cards eval and recs  Daylene Posey, PharmD, [facility] Clinical Pharmacist ED Pharmacist Phone # [phone] 04/14/2023 4:21 PM

## 2023-04-14 NOTE — ED Provider Triage Note (Signed)
Emergency Medicine Provider Triage Evaluation Note  [name] , a 53 y.o. male  was evaluated in triage.  Pt complains of CP.  Patient with sudden onset left-sided chest pain that radiates to the back while taking a shower.  Worse with deep breathing.  Review of Systems  Positive: Chest pain Negative: Cough congestion fever trauma  Physical Exam  There were no vitals taken for this visit. Gen:   Awake, no distress   Resp:  Normal effort  MSK:   Moves extremities without difficulty  Other:  Palpation of the chest wall reproduces his discomfort  Medical Decision Making  Medically screening exam initiated at 10:04 AM.  Appropriate orders placed.  [name] was informed that the remainder of the evaluation will be completed by another provider, this initial triage assessment does not replace that evaluation, and the importance of remaining in the ED until their evaluation is complete.  Patient with a history of coronary artery disease comes with a chief complaint of chest pain.  Atypical in nature and reproduced on exam.  Troponins blood work.  Chest x-ray.   Melene Plan, DO 04/14/23 1005

## 2023-04-14 NOTE — Consult Note (Signed)
Cardiology Consultation   Patient ID: [name] MRN: [id number]; DOB: March 30, 1970  Admit date: 04/14/2023 Date of Consult: 04/14/2023  PCP:  Loura Back, NP   [facility] Providers Cardiologist:  Marca Ancona, MD  Advanced Heart Failure:  Marca Ancona, MD  {.  Patient Profile:   [name] is a 53 y.o. male with a hx of NICM, HTN, ETOH use, CAD s/p NSTEMI PCI to RI '19, ascending aortic dissection s/p Bentall with AVR and arch repair in 2016 at [facility] with staged endovascular repair of descending aortic aneurysm, ESRD on HD, thrombocytopenia, history of CVA who is being seen 04/14/2023 for the evaluation of chest pain, elevated troponins at the request of Dr. Lockie Mola.  History of Present Illness:   [name] has a past medical history of NICM, HTN, ETOH use, CAD s/p NSTEMI PCI to RI '19, ascending aortic dissection s/p Bentall with AVR and arch repair '16 (at [facility]) with staged endovascular repair of descending aortic aneurysm, ESRD on HD (Tue/Thur/Sat), thrombocytopenia, history of CVA. He presented to [facility] ED on 04/14/2023 via EMS with chief complaint of chest pain. He reports the chest pain came on suddenly, is mainly left-sided, radiates to his back and is not relieved by nitroglycerin, and [name] worse with deep breaths. Per chart review, he denies any cough, congestion, fever, or trauma. The pain was reported to be reproducible on exam in the ED.   Workup in the ED involved; CXR that showed no active cardiopulmonary disease, CBC showed thrombocytopenia, chronic but worse than normal, BMP showed K of 3.4, creatinine of 6.62, eGFR of 9, elevated of initial troponin of 400  He has a complicated medical history. Back in 2016, he presented with hypertensive emergency and a type B aortic dissection distal to the left subclavian. He underwent an aortic valve replacement with a bovine pericardial tissue valve and ascending aortic graft implantation. Then two years later, he had  a staged endovascular repair of his type B dissection. He then began having bioprosthetic aortic valve degeneration as seen on echocardiograms. Patient then was started on hemodialysis for end state renal diease. He was hospitalized in 01/2023 for heart failure. He has a negative stress test and underwent a coronary angiography that showed mild to moderate diffuse nonobstructive disease. TTE and TEE were both consistent with bioprosthetic aortic valve degeneration. He was diagnosed with bacteremia but had no evidence of vegetations/endocarditis.  He was seen in office on 01/2023 by Dr. Lynnette Caffey where they discussed the potential TAVR procedure pending the workup. While in the process of completing this workup patient was re-admitted to the hospital from 12/6-12/19/24 for hypertensive emergency where he presented with severe chest pain and shortness of breath. During this admission he experienced acute pulmonary edema, requiring BiPAP, was admitted to the ICU and underwent HD with improvement in his respiratory status. Cardiology was not consulted on this patient during this admission.   He was last seen by CT surgery [facility] on 04/02/2023 for ongoing aortic surveillance. Imaging at this visit demonstrated a large pseudoaneurysm of prior Bentall graft, distal aortic repair intact with aneurysmal disease of abdominal aorta, common iliac arteries. TTE demonstrates dehiscence of the aortic valve. They determined he would be at high risk for extensive redo surgery.  After speaking and examining patient, he agrees with the history above. He describes his chest pain as a burning sensation. Explains that typically he has some stable angina and if he had previous acute episodes they are premeditated  with shortness of breath or other symptoms. This episode, starting this morning when he was getting ready, was isolated chest pain. He re-states that nitroglycerin does not help his chest pain. He endorses associated nausea, no  vomiting, and some lightheadedness with this chest pain. He denies any syncope or dyspnea. He is currently on room air with SpO2 at 100%, he is non-tachycardic which makes a PE less likely. His chest pain was reproducible on exam today. Denies any known sick contacts. Denies any leg pain or swelling.   Past Medical History:  Diagnosis Date   Adenomatous colon polyp 08/2015   Anxiety    Aortic disease (HCC)    Aortic dissection (HCC)    a. admx 04/2014 >> L renal infarct; a/c renal failure >> b.  s/p Bioprosthetic Bentall and total arch replacement and staged endovascular repair of descending aortic aneurysm ([facility] - Dr. Kizzie Bane)   CAD (coronary artery disease)    a. LHC 4/16:  oD1 60%   Cardiomyopathy (HCC)    a. non-ischemic - probably related to untreated HTN and ETOH abuse - Echo 3/13 with EF 35-40% >> b. Echo 4/16: Severe LVH, EF 55-60%, moderate AI, moderate MR, mild LAE, trivial effusion, known type B dissection with communication between true and false lumens with suprasternal images suggesting dissection plane may propagate to at least left subclavian takeoff, root above aortic valve okay     Chronic abdominal pain    Chronic combined systolic and diastolic congestive heart failure (HCC)    a. 05/2011: Adm with pulm edema/HTN urgency, EF 35-40% with diffuse hypokinesis and moderate to severe mitral regurgitation. Cardiomyopathy likely due to uncontrolled HTN and ETOH abuse - cath deferred due to renal insufficiency (felt due to uncontrolled HTN). bElzie Rings MV 06/2011: EF 37% and no ischemia or infarction. c. EF 45-50% by echo 01/2012.   Chronic sinusitis    DDD (degenerative disc disease), lumbar    Depression    Descending thoracic aortic aneurysm (HCC)    Dissecting aneurysm of thoracic aorta (HCC)    ESRD (end stage renal disease) (HCC)    on dialysis 1-2 days per week (2 days ordered, only coming 1 day per week as of oct 2024). on dialysis 3-4 years total   ETOH abuse    a. Reported  to have quit 05/2011.   Frequent headaches    GERD (gastroesophageal reflux disease)    Headache(784.0)    "q other day" (08/08/2013)   Hemorrhoid thrombosis    History of echocardiogram    Echo 1/17:  Severe LVH, EF 55-60%, no RWMA, Gr 2 DD, AVR ok, mild to mod MR, mild LAE, mild reduced RVSF, mod RAE   History of medication noncompliance    HYPERLIPIDEMIA    Hypertension    a. Hx of HTN urgency secondary to noncompliance. b. urinary metanephrine and catecholeamine levels normal 2013.  c. Renal art [name] 1/16:  No evidence of renal artery stenosis noted bilaterally.   INGUINAL HERNIA    Pneumonia ~ 2013   Serrated adenoma of colon 08/2015   Stroke [facility])    Tobacco abuse    Valvular heart disease    a. Echo 05/2011: moderate to severe eccentric MR and mild to moderate AI with prolapsing left coronary cusp. b. Echo 01/2012: mild-mod AI, mild dilitation of aortic root, mild MR.;  c. Echo 1/16: Severe LVH consistent with hypertrophic cardio myopathy, EF 50%, no RWMA, mod AI, mild MR, mild RAE, dilated Ao root (40 mm);  Past Surgical History:  Procedure Laterality Date   A/V FISTULAGRAM Left 03/07/2021   Procedure: A/V FISTULAGRAM;  Surgeon: Chuck Hint, MD;  Location: [facility] INVASIVE CV LAB;  Service: Cardiovascular;  Laterality: Left;   A/V FISTULAGRAM Left 07/18/2021   Procedure: A/V Fistulagram;  Surgeon: Chuck Hint, MD;  Location: [facility] INVASIVE CV LAB;  Service: Cardiovascular;  Laterality: Left;   A/V FISTULAGRAM Left 12/25/2022   Procedure: A/V Fistulagram;  Surgeon: Leonie Douglas, MD;  Location: [facility] INVASIVE CV LAB;  Service: Cardiovascular;  Laterality: Left;   ANKLE SURGERY Bilateral    Fractures bilaterally   AORTIC VALVE SURGERY  09/2014   AV FISTULA PLACEMENT Left 09/09/2020   Procedure: LEFT ARM ARTERIOVENOUS (AV) FISTULA CREATION;  Surgeon: Leonie Douglas, MD;  Location: [facility] OR;  Service: Vascular;  Laterality: Left;   BASCILIC VEIN TRANSPOSITION Left 10/09/2020    Procedure: LEFT SECOND STAGE BASILIC VEIN TRANSPOSITION;  Surgeon: Leonie Douglas, MD;  Location: [facility] OR;  Service: Vascular;  Laterality: Left;  PERIPHERAL NERVE BLOCK   CORONARY ANGIOGRAPHY N/A 12/06/2017   Procedure: CORONARY ANGIOGRAPHY;  Surgeon: Lyn Records, MD;  Location: [facility] INVASIVE CV LAB;  Service: Cardiovascular;  Laterality: N/A;   CORONARY ANGIOGRAPHY N/A 09/26/2018   Procedure: CORONARY ANGIOGRAPHY;  Surgeon: Yvonne Kendall, MD;  Location: [facility] INVASIVE CV LAB;  Service: Cardiovascular;  Laterality: N/A;   CORONARY STENT INTERVENTION N/A 12/10/2017   Procedure: CORONARY STENT INTERVENTION;  Surgeon: Lennette Bihari, MD;  Location: [facility] INVASIVE CV LAB;  Service: Cardiovascular;  Laterality: N/A;   FOOT FRACTURE SURGERY Bilateral 2004-2010   "got pins in both of them"   HEMORRHOID SURGERY N/A 06/15/2015   Procedure: HEMORRHOIDECTOMY;  Surgeon: Almond Lint, MD;  Location: [facility] OR;  Service: General;  Laterality: N/A;   INGUINAL HERNIA REPAIR Right ~ 1996   INSERTION OF DIALYSIS CATHETER N/A 09/09/2020   Procedure: INSERTION OF TUNNELED DIALYSIS PALINDROME 19cm CATHETER;  Surgeon: Leonie Douglas, MD;  Location: [facility] OR;  Service: Vascular;  Laterality: N/A;   LEFT HEART CATH AND CORONARY ANGIOGRAPHY N/A 02/05/2021   Procedure: LEFT HEART CATH AND CORONARY ANGIOGRAPHY;  Surgeon: Laurey Morale, MD;  Location: [facility] INVASIVE CV LAB;  Service: Cardiovascular;  Laterality: N/A;   LEFT HEART CATH AND CORONARY ANGIOGRAPHY N/A 01/22/2023   Procedure: LEFT HEART CATH AND CORONARY ANGIOGRAPHY;  Surgeon: [place], Peter M, MD;  Location: [facility] INVASIVE CV LAB;  Service: Cardiovascular;  Laterality: N/A;   LEFT HEART CATHETERIZATION WITH CORONARY ANGIOGRAM N/A 06/21/2014   Procedure: LEFT HEART CATHETERIZATION WITH CORONARY ANGIOGRAM;  Surgeon: Laurey Morale, MD;  Location: [facility] CATH LAB;  Service: Cardiovascular;  Laterality: N/A;   LOOP RECORDER INSERTION N/A 06/19/2016   Procedure: Loop Recorder  Insertion;  Surgeon: [facility] Salvia, MD;  Location: [facility] INVASIVE CV LAB;  Service: Cardiovascular;  Laterality: N/A;   PERIPHERAL VASCULAR BALLOON ANGIOPLASTY  03/07/2021   Procedure: PERIPHERAL VASCULAR BALLOON ANGIOPLASTY;  Surgeon: Chuck Hint, MD;  Location: [facility] INVASIVE CV LAB;  Service: Cardiovascular;;   PERIPHERAL VASCULAR BALLOON ANGIOPLASTY  07/18/2021   Procedure: PERIPHERAL VASCULAR BALLOON ANGIOPLASTY;  Surgeon: Chuck Hint, MD;  Location: [facility] INVASIVE CV LAB;  Service: Cardiovascular;;   PERIPHERAL VASCULAR INTERVENTION Left 12/25/2022   Procedure: PERIPHERAL VASCULAR INTERVENTION;  Surgeon: Leonie Douglas, MD;  Location: [facility] INVASIVE CV LAB;  Service: Cardiovascular;  Laterality: Left;  stent to brachiocephalic vein   PERIPHERAL VASCULAR ULTRASOUND/IVUS Left 12/25/2022   Procedure: Peripheral Vascular  Ultrasound/IVUS;  Surgeon: Leonie Douglas, MD;  Location: [facility] INVASIVE CV LAB;  Service: Cardiovascular;  Laterality: Left;   TEE WITHOUT CARDIOVERSION N/A 03/12/2016   Procedure: TRANSESOPHAGEAL ECHOCARDIOGRAM (TEE);  Surgeon: Thurmon Fair, MD;  Location: [facility] ENDOSCOPY;  Service: Cardiovascular;  Laterality: N/A;   TEE WITHOUT CARDIOVERSION N/A 10/16/2020   Procedure: TRANSESOPHAGEAL ECHOCARDIOGRAM (TEE);  Surgeon: Laurey Morale, MD;  Location: [facility] ENDOSCOPY;  Service: Cardiovascular;  Laterality: N/A;   TRANSESOPHAGEAL ECHOCARDIOGRAM (CATH LAB) N/A 01/13/2023   Procedure: TRANSESOPHAGEAL ECHOCARDIOGRAM;  Surgeon: Quintella Reichert, MD;  Location: [facility] INVASIVE CV LAB;  Service: Cardiovascular;  Laterality: N/A;   UPPER EXTREMITY VENOGRAPHY Right 07/18/2021   Procedure: UPPER EXTREMITY VENOGRAPHY;  Surgeon: Chuck Hint, MD;  Location: [facility] INVASIVE CV LAB;  Service: Cardiovascular;  Laterality: Right;     Home Medications:  Prior to Admission medications   Medication Sig Start Date End Date Taking? Authorizing Provider  acetaminophen (TYLENOL) 500 MG tablet  Take 1,000 mg by mouth every 6 (six) hours as needed for mild pain (pain score 1-3), moderate pain (pain score 4-6) or headache.    [provider]  albuterol (VENTOLIN HFA) 108 (90 Base) MCG/ACT inhaler Inhale 1 puff into the lungs every 6 (six) hours as needed for wheezing or shortness of breath. 05/24/21   [provider]  amLODipine (NORVASC) 10 MG tablet TAKE 1 TABLET(10 MG) BY MOUTH DAILY 07/15/21   Laurey Morale, MD  aspirin EC 81 MG EC tablet Take 1 tablet (81 mg total) by mouth daily. 12/12/17   [facility], Roe Rutherford, PA  carvedilol (COREG) 12.5 MG tablet Take 1 tablet (12.5 mg total) by mouth 2 (two) times daily. 02/01/23 05/02/23  Jacklynn Ganong, FNP  cephALEXin (KEFLEX) 500 MG capsule Take 2 capsules (1,000 mg total) by mouth daily. Daily at night time, start from 12/9 after completion of IV course. Patient taking differently: Take 1,000 mg by mouth at bedtime. 02/17/23   Odette Fraction, MD  cloNIDine (CATAPRES) 0.2 MG tablet Take 0.2 mg by mouth 2 (two) times daily.    [provider]  Evolocumab (REPATHA SURECLICK) 140 MG/ML SOAJ Inject 140 mg into the skin every 14 (fourteen) days. 10/28/22   Hilty, Lisette Abu, MD  hydrALAZINE (APRESOLINE) 100 MG tablet Take 0.5 tablets (50 mg total) by mouth 3 (three) times daily. 01/14/23   Burnadette Pop, MD  HYDROcodone-acetaminophen (NORCO/VICODIN) 5-325 MG tablet Take 1 tablet by mouth every 8 (eight) hours as needed. 01/29/23   [provider]  lidocaine 4 % Place 1 patch onto the skin daily. Apply to lower back    [provider]  lidocaine-prilocaine (EMLA) cream Apply 1 application. topically as needed (prior to port being accessed). Use on dialysis days (Tues, Thurs and Sat)    [provider]  meclizine (ANTIVERT) 25 MG tablet Take 1 tablet (25 mg total) by mouth 3 (three) times daily as needed for dizziness. 12/21/22   Vanetta Mulders, MD  nitroGLYCERIN (NITROSTAT) 0.4 MG SL tablet Place  1 tablet (0.4 mg total) under the tongue every 5 (five) minutes as needed for chest pain. 01/14/23   Burnadette Pop, MD  oxyCODONE-acetaminophen (PERCOCET) 5-325 MG tablet Take 1 tablet by mouth every 4 (four) hours as needed for severe pain. 12/25/22 12/25/23  Leonie Douglas, MD  pantoprazole (PROTONIX) 40 MG tablet Take 1 tablet (40 mg total) by mouth daily. 01/23/23   Marinda Elk, MD  polyethylene glycol powder [facility]) 17 GM/SCOOP powder  Take 17 g by mouth daily. 01/14/23   Burnadette Pop, MD  STIOLTO RESPIMAT 2.5-2.5 MCG/ACT AERS Inhale 2 puffs into the lungs at bedtime. 12/29/22   [provider]   Inpatient Medications: Scheduled Meds:  carvedilol  12.5 mg Oral BID WC   Continuous Infusions:  heparin 900 Units/hr (04/14/23 1627)   PRN Meds:  Allergies:    Allergies  Allergen Reactions   Clonidine Derivatives Nausea And Vomiting and Other (See Comments)    Tablets by mouth = Tolerated  Patches on the skin = Nausea & vomiting after wearing 4-5 days    Imdur [Isosorbide Nitrate] Other (See Comments)    Headaches -- patient instructed to continue taking    Tramadol Other (See Comments)    Headaches    Clonidine Nausea And Vomiting   Social History:   Social History   Socioeconomic History   Marital status: Married    Spouse name: Not on file   Number of children: 5   Years of education: 12   Highest education level: Not on file  Occupational History   Occupation: Disabled, part-time Corporate investment banker    Comment: Disability  Tobacco Use   Smoking status: Some Days    Current packs/day: 0.25    Average packs/day: 0.3 packs/day for 23.0 years (5.8 ttl pk-yrs)    Types: Cigarettes    Passive exposure: Current   Smokeless tobacco: Never   Tobacco comments:    3 to 4 per day  Vaping Use   Vaping status: Not on file  Substance and Sexual Activity   Alcohol use: Not Currently    Alcohol/week: 1.0 standard drink of alcohol    Types: 1  Cans of beer per week    Comment: On special occasion   Drug use: Yes    Types: Marijuana    Comment: 2 times/week - last use 10/06/20   Sexual activity: Yes  Other Topics Concern   Not on file  Social History Narrative   Fun: Enjoy his children   Social Drivers of Corporate investment banker Strain: Low Risk  (07/07/2022)   [facility] from [facility]   Overall Financial Resource Strain (CARDIA)    Difficulty of Paying Living Expenses: Not hard at all  Food Insecurity: No Food Insecurity (03/03/2023)   Hunger Vital Sign    Worried About Running Out of Food in the Last Year: Never true    Ran Out of Food in the Last Year: Never true  Transportation Needs: No Transportation Needs (03/03/2023)   PRAPARE - Administrator, Civil Service (Medical): No    Lack of Transportation (Non-Medical): No  Physical Activity: Not on file  Stress: Not on file  Social Connections: Unknown (05/11/2022)   Received from [facility], [facility]   Social Network    Social Network: Not on file  Intimate Partner Violence: Not At Risk (03/03/2023)   Humiliation, Afraid, Rape, and Kick questionnaire    Fear of Current or Ex-Partner: No    Emotionally Abused: No    Physically Abused: No    Sexually Abused: No    Family History:   Family History  Problem Relation Age of Onset   Hypertension Mother    Hypertension Other    Colon cancer Paternal Uncle    Stroke Maternal Aunt    Heart attack Brother    Hypertension Brother    Diabetes Maternal Aunt    Lung cancer Maternal Uncle    Stroke  Maternal Uncle    Other Sister        "breathing machine at night"   Headache Sister    Thyroid disease Sister    Throat cancer Neg Hx    Pancreatic cancer Neg Hx    Esophageal cancer Neg Hx    Kidney disease Neg Hx    Liver disease Neg Hx     ROS:  Please see the history of present illness.  All other ROS reviewed and negative.     Physical Exam/Data:   Vitals:    04/14/23 1020 04/14/23 1201 04/14/23 1545 04/14/23 1600  BP: 134/89 (!) 150/97 (!) 146/95 (!) 146/100  Pulse: 85 87 86 83  Resp: 18 17 (!) 26 15  Temp: 97.9 F (36.6 C) 97.6 F (36.4 C)  97.6 F (36.4 C)  TempSrc:    Oral  SpO2: 100% 100% 100% 99%   No intake or output data in the 24 hours ending 04/14/23 1633    03/04/2023   11:45 AM 03/04/2023    7:49 AM 03/04/2023    5:00 AM  Last 3 Weights  Weight (lbs) 136 lb 14.5 oz 143 lb 4.8 oz 138 lb 1.6 oz  Weight (kg) 62.1 kg 65 kg 62.642 kg     There is no height or weight on file to calculate BMI.  General:  Well nourished, well developed, in no acute distress HEENT: normal Neck: no JVD Vascular:  Distal pulses 2+ bilaterally Cardiac:  RRR; III/VI systolic ejection murmur  Lungs:  clear to auscultation bilaterally Abd: soft, nontender,  Ext: no edema Musculoskeletal:  No deformities Skin: warm and dry  Neuro:  no focal abnormalities noted Psych:  Normal affect   EKG:  The EKG was personally reviewed and demonstrates:  sinus rhythm with LVH, biatrial enlargment but prior TWI have resolved Telemetry:  Telemetry was personally reviewed and demonstrates:  sinus rhythm, HR 70-80s  Relevant CV Studies: Echocardiogram (02/15/2023) IMPRESSIONS   1. Severe biventricular hypertrophy, reasonable to rule out amyloid. Left  ventricular ejection fraction, by estimation, is 55 to 60%. The left  ventricle has normal function. The left ventricle has no regional wall  motion abnormalities. There is severe  left ventricular hypertrophy. Left ventricular diastolic parameters are  consistent with Grade I diastolic dysfunction (impaired relaxation).   2. Right ventricular systolic function is mildly reduced. The right  ventricular size is normal. Moderately increased right ventricular wall  thickness.   3. Left atrial size was severely dilated.   4. Right atrial size was severely dilated.   5. The mitral valve is normal in structure. Mild  mitral valve  regurgitation.   6. 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 There does not appear to be any significant paravalvular  leak or leaflet involvement. As noted previously, significant prosthetic  valve degeneration and stenosis.  The aortic valve has been repaired/replaced. There is mild calcification  of the aortic valve. There is moderate thickening of the aortic valve.  Aortic valve regurgitation is not visualized. Echo findings are consistent  with stenosis of the aortic  prosthesis. Aortic valve area, by VTI measures 1.36 cm. Aortic valve mean  gradient measures 30.5 mmHg. Aortic valve Vmax measures 3.80 m/s.   7. Patient with previous aortic Bentall procedure in 2016. There appears  to be a large pseudoaneursym primarily anterior to the aortic valve best  seen adjacent to the right coronary cusp but extending circumferentially.  Aortic dilatation noted.  8. Moderately dilated pulmonary artery.   9. The inferior vena cava is normal in size with greater than 50%  respiratory variability, suggesting right atrial pressure of 3 mmHg.   Conclusion(s)/Recommendation(s): Discussed findings of LVOT pseudoaneurysm  with TAVR team.   FINDINGS   Left Ventricle: Severe biventricular hypertrophy, reasonable to rule out  amyloid. Left ventricular ejection fraction, by estimation, is 55 to 60%.  The left ventricle has normal function. The left ventricle has no regional  wall motion abnormalities. The  left ventricular internal cavity size was normal in size. There is severe  left ventricular hypertrophy. Abnormal (paradoxical) septal motion  consistent with post-operative status. Left ventricular diastolic  parameters are consistent with Grade I  diastolic dysfunction (impaired relaxation).   Right Ventricle: The right ventricular size is normal. Moderately  increased right ventricular wall thickness. Right ventricular systolic   function is mildly reduced.   Left Atrium: Left atrial size was severely dilated.   Right Atrium: Right atrial size was severely dilated.   Pericardium: Trivial pericardial effusion is present.   Mitral Valve: The mitral valve is normal in structure. Mild mitral valve  regurgitation.   Tricuspid Valve: The tricuspid valve is normal in structure. Tricuspid  valve regurgitation is mild.   Aortic Valve: Large pseudoaneurysm of the aortic valve and associated  graft from previous Bentall. There does not appear to be any significant  paravalvular leak or leaflet involvement. As noted previously, significant  prosthetic valve degeneration and  stenosis. The aortic valve has been repaired/replaced. There is mild  calcification of the aortic valve. There is moderate thickening of the  aortic valve. Aortic valve regurgitation is not visualized. Aortic valve  mean gradient measures 30.5 mmHg. Aortic  valve peak gradient measures 57.6 mmHg. Aortic valve area, by VTI measures  1.36 cm. There is a 27 mm Mitroflow pericardial valve pericardial valve  present in the aortic position.   Pulmonic Valve: The pulmonic valve was normal in structure. Pulmonic valve  regurgitation is mild.   Aorta: Patient with previous aortic Bentall procedure in 2016. There  appears to be a large pseudoaneursym primarily anterior to the aortic  valve best seen adjacent to the right coronary cusp but extending  circumferentially. Aortic dilatation noted.   Pulmonary Artery: The pulmonary artery is moderately dilated.   Venous: The inferior vena cava is normal in size with greater than 50%  respiratory variability, suggesting right atrial pressure of 3 mmHg.   IAS/Shunts: No atrial level shunt detected by color flow Doppler   Coronary CTA (02/10/2023) FINDINGS: Cardiovascular: Aortic stent graft is again seen and stable.   Mediastinum/Nodes: There are no enlarged lymph nodes within the visualized  mediastinum.   Lungs/Pleura: There is no pleural effusion. Mild scarring is noted in the bases bilaterally.   Upper abdomen: No significant findings in the visualized upper abdomen.   Musculoskeletal/Chest wall: No chest wall mass or suspicious osseous findings within the visualized chest.   IMPRESSION: No significant extracardiac findings within the visualized chest.  Cardiac catheterization (01/22/2023)   Prox LAD to Mid LAD lesion is 30% stenosed.   Mid LAD lesion is 40% stenosed.   Prox Cx lesion is 40% stenosed.   Mid Cx lesion is 40% stenosed.   Prox RCA lesion is 40% stenosed.   1st Diag lesion is 95% stenosed.   Non-stenotic Ost Ramus lesion was previously treated.   Nonobstructive CAD except for chronically occluded small diagonal. Patent stent in the Ramus intermediate. Markedly aneurysmal coronary arteries.  No change in angiography compared to prior study.  Laboratory Data:  High Sensitivity Troponin:   Recent Labs  Lab 04/14/23 1020 04/14/23 1344  TROPONINIHS 400* 4,234*     Chemistry Recent Labs  Lab 04/14/23 1020  NA 138  K 3.4*  CL 96*  CO2 24  GLUCOSE 104*  BUN 41*  CREATININE 6.62*  CALCIUM 10.0  GFRNONAA 9*  ANIONGAP 18*    No results for input(s): "PROT", "ALBUMIN", "AST", "ALT", "ALKPHOS", "BILITOT" in the last 168 hours. Lipids No results for input(s): "CHOL", "TRIG", "HDL", "LABVLDL", "LDLCALC", "CHOLHDL" in the last 168 hours.  Hematology Recent Labs  Lab 04/14/23 1020  WBC 6.1  RBC 4.44  HGB 13.6  HCT 42.9  MCV 96.6  MCH 30.6  MCHC 31.7  RDW 14.2  PLT 89*   Thyroid No results for input(s): "TSH", "FREET4" in the last 168 hours.  BNPNo results for input(s): "BNP", "PROBNP" in the last 168 hours.  DDimer No results for input(s): "DDIMER" in the last 168 hours.  Radiology/Studies:  CT Angio Chest/Abd/Pel for Dissection W and/or Wo Contrast Result Date: 04/14/2023 CLINICAL DATA:  Chest pain radiating to the back. Acute aortic  syndrome suspected. EXAM: CT ANGIOGRAPHY CHEST, ABDOMEN AND PELVIS TECHNIQUE: Non-contrast CT of the chest was initially obtained. Multidetector CT imaging through the chest, abdomen and pelvis was performed using the standard protocol during bolus administration of intravenous contrast. Multiplanar reconstructed images and MIPs were obtained and reviewed to evaluate the vascular anatomy. RADIATION DOSE REDUCTION: This exam was performed according to the departmental dose-optimization program which includes automated exposure control, adjustment of the mA and/or kV according to patient size and/or use of iterative reconstruction technique. CONTRAST:  75mL OMNIPAQUE IOHEXOL 350 MG/ML SOLN COMPARISON:  CT dated 03/02/2023. FINDINGS: Evaluation of this exam is limited due to respiratory motion and streak artifact caused by patient's arms. Evaluation is also limited due to loss of intra-abdominal fat and cachexia. CTA CHEST FINDINGS Cardiovascular: Stable cardiomegaly. No pericardial effusion. Coronary vascular calcification. Mechanical aortic valve. Postsurgical changes of the repair of the ascending aorta and endovascular stent graft repair of the thoracic aorta similar to prior CT. The stent graft appears patent. No contrast noted outside of the confines of the stent graft. No periaortic fluid collection or hematoma. The origins of the great vessels of the aortic arch appear patent as visualized. The central pulmonary arteries appear patent. Mediastinum/Nodes: No hilar or mediastinal adenopathy. The esophagus is grossly unremarkable. No mediastinal fluid collection. Lungs/Pleura: Trace left pleural effusion. No focal consolidation, or pneumothorax. The central airways are patent. Musculoskeletal: No acute osseous pathology. Left anterior chest wall implantable cardiac device. Median sternotomy wires. Review of the MIP images confirms the above findings. CTA ABDOMEN AND PELVIS FINDINGS VASCULAR Aorta: Similar  appearance of postsurgical changes of the upper abdominal aorta with irregularity of the wall. No aortic dissection. A 4 cm distal abdominal aortic aneurysm similar to prior CT. A 3.3 x 3.4 cm thrombosed aneurysm sac from the proximal abdominal aorta similar to prior CT. Celiac: Postsurgical changes adjacent to the celiac trunk. The celiac artery and its major branches appear patent. SMA: The SMA is patent. Renals: The renal arteries appear patent. IMA: The IMA is patent. Inflow: Moderate atherosclerotic calcification. 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 No dissection. The iliac arteries are patent. Veins: The IVC is unremarkable.  No portal venous gas. Review of the MIP images confirms  the above findings. NON-VASCULAR No intra-abdominal free air.  Small free fluid in the pelvis. Hepatobiliary: Small cyst in the dome of the liver. No biliary duct dilatation. Small calcified gallstone. No pericholecystic fluid. Pancreas: The pancreas is unremarkable as visualized. Spleen: The spleen is unremarkable Adrenals/Urinary Tract: The adrenal glands are unremarkable. There is no hydronephrosis on either side. There is hypoenhancement of the left renal parenchyma suggestive of hypoperfusion. There is mild left renal parenchyma atrophy. There is no hydronephrosis on either side. The urinary bladder is grossly unremarkable. Stomach/Bowel: Evaluation of the bowel is limited in the absence of oral contrast and due to factors above. No bowel obstruction. The appendix is unremarkable as visualized. Lymphatic: No obvious adenopathy. Reproductive: The prostate and seminal vesicles are grossly remarkable. Other: Loss of subcutaneous fat and cachexia. Musculoskeletal: Degenerative changes of the spine. No acute osseous pathology. Review of the MIP images confirms the above findings. IMPRESSION: 1. No acute intrathoracic, abdominal, or pelvic pathology. No aortic  dissection. 2. Stable postsurgical changes of the repair of the ascending aorta and endovascular stent graft repair of the thoracic aorta. 3. A 4 cm distal abdominal aortic aneurysm similar to prior CT. Recommend follow-up CT or MR as appropriate in 12 months and referral to or continued care with vascular specialist. (Ref.: J Vasc Surg. 2018; 67:2-77 and J Am Coll Radiol 2013;10(10):789-794.) 4. 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 5. Cholelithiasis. 6. Hypoperfusion of the left kidney.  No hydronephrosis. Electronically Signed   By: Elgie Collard M.D.   On: 04/14/2023 15:42   DG Chest 2 View Result Date: 04/14/2023 CLINICAL DATA:  Chest pain. EXAM: CHEST - 2 VIEW COMPARISON:  03/02/2023. FINDINGS: Bilateral lung fields are clear. Bilateral costophrenic angles are clear. Stable cardio-mediastinal silhouette. Sternotomy wires, loop recorder device and vascular stents again noted. No acute osseous abnormalities. The soft tissues are within normal limits. IMPRESSION: No active cardiopulmonary disease. Electronically Signed   By: Jules Schick M.D.   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 No aortic dissection. EKG from today showed sinus rhythm with LVH, biatrial enlargment but prior TWI have resolved Although not typical ACS presenting symptoms with history of CAD, significant troponin elevation, patient will need ischemic evaluation After discussing with MD, will make patient NPO at midnight, start IV heparin, with plans  for [facility] tomorrow   Non-rhemautic aortic valve stenosis s/p AVR with bioprosthetic valve, Bentall procedure 2016 ([facility]) Degeneration of aortic valve replacement  Last seen by [facility] CT surgery 03/2023 where they discussed the degenerative state of his aortic valve replacement.  Imaging at this visit showed: a large pseudoaneurysm of prior Bentall graft, distal aortic repair intact with aneurysmal disease of abdominal aorta, common iliac arteries. TTE demonstrates dehiscence of the aortic valve They determined that it would be too high risk for extensive redo surgery Patient currently denies any dyspnea, orthopnea, syncope. As patient is not candidate for redo surgery, will plan to follow with conservative treatment   Hypertension Most recent BP reading: 150/97 Patient home meds include: amlodipine 10  mg, coreg 12.5 mg BID, hydralazine 50 mg TID Start coreg 12.5 mg today and consider adding back other agents tomorrow pending BP  Hyperlipidemia  Lipid panel from 12/2022: total 153, HDL 47, LDL 97, trigs 47 Patient on Repatha, questionable compliance per [facility] fill history Discuss importance of being compliant with Repatha  Per primary ESRD on HD Substance abuse  Risk Assessment/Risk Scores:     TIMI Risk Score for Unstable Angina or Non-ST Elevation MI:   The patient's TIMI risk score is 4, which indicates a 20% risk of all cause mortality, new or recurrent myocardial infarction or need for urgent revascularization in the next 14 days.  For questions or updates, please contact [facility] Please consult [email or web address] for contact info under   Signed, Olena Leatherwood, PA-C  04/14/2023 4:33 PM

## 2023-04-15 ENCOUNTER — Encounter (HOSPITAL_COMMUNITY)
Admission: EM | Disposition: A | Discharge status: Home / Self Care | Payer: Self-pay | Source: Home / Self Care | Attending: Internal Medicine

## 2023-04-15 ENCOUNTER — Observation Stay (HOSPITAL_COMMUNITY): Payer: 59

## 2023-04-15 DIAGNOSIS — Z515 Encounter for palliative care: Secondary | ICD-10-CM | POA: Diagnosis not present

## 2023-04-15 DIAGNOSIS — I2510 Atherosclerotic heart disease of native coronary artery without angina pectoris: Secondary | ICD-10-CM | POA: Diagnosis present

## 2023-04-15 DIAGNOSIS — I10 Essential (primary) hypertension: Secondary | ICD-10-CM | POA: Diagnosis not present

## 2023-04-15 DIAGNOSIS — I214 Non-ST elevation (NSTEMI) myocardial infarction: Secondary | ICD-10-CM

## 2023-04-15 DIAGNOSIS — J449 Chronic obstructive pulmonary disease, unspecified: Secondary | ICD-10-CM | POA: Diagnosis present

## 2023-04-15 DIAGNOSIS — Z7189 Other specified counseling: Secondary | ICD-10-CM | POA: Diagnosis not present

## 2023-04-15 DIAGNOSIS — N2581 Secondary hyperparathyroidism of renal origin: Secondary | ICD-10-CM | POA: Diagnosis present

## 2023-04-15 DIAGNOSIS — Z833 Family history of diabetes mellitus: Secondary | ICD-10-CM | POA: Diagnosis not present

## 2023-04-15 DIAGNOSIS — Z951 Presence of aortocoronary bypass graft: Secondary | ICD-10-CM | POA: Diagnosis not present

## 2023-04-15 DIAGNOSIS — E875 Hyperkalemia: Secondary | ICD-10-CM | POA: Diagnosis present

## 2023-04-15 DIAGNOSIS — Z992 Dependence on renal dialysis: Secondary | ICD-10-CM | POA: Diagnosis not present

## 2023-04-15 DIAGNOSIS — K219 Gastro-esophageal reflux disease without esophagitis: Secondary | ICD-10-CM | POA: Diagnosis present

## 2023-04-15 DIAGNOSIS — N186 End stage renal disease: Secondary | ICD-10-CM | POA: Diagnosis present

## 2023-04-15 DIAGNOSIS — E1122 Type 2 diabetes mellitus with diabetic chronic kidney disease: Secondary | ICD-10-CM | POA: Diagnosis present

## 2023-04-15 DIAGNOSIS — Z66 Do not resuscitate: Secondary | ICD-10-CM | POA: Diagnosis present

## 2023-04-15 DIAGNOSIS — Z79899 Other long term (current) drug therapy: Secondary | ICD-10-CM | POA: Diagnosis not present

## 2023-04-15 DIAGNOSIS — E785 Hyperlipidemia, unspecified: Secondary | ICD-10-CM | POA: Diagnosis present

## 2023-04-15 DIAGNOSIS — Y831 Surgical operation with implant of artificial internal device as the cause of abnormal reaction of the patient, or of later complication, without mention of misadventure at the time of the procedure: Secondary | ICD-10-CM | POA: Diagnosis present

## 2023-04-15 DIAGNOSIS — I5042 Chronic combined systolic (congestive) and diastolic (congestive) heart failure: Secondary | ICD-10-CM | POA: Diagnosis present

## 2023-04-15 DIAGNOSIS — F17210 Nicotine dependence, cigarettes, uncomplicated: Secondary | ICD-10-CM | POA: Diagnosis present

## 2023-04-15 DIAGNOSIS — T826XXA Infection and inflammatory reaction due to cardiac valve prosthesis, initial encounter: Secondary | ICD-10-CM | POA: Diagnosis present

## 2023-04-15 DIAGNOSIS — Z8249 Family history of ischemic heart disease and other diseases of the circulatory system: Secondary | ICD-10-CM | POA: Diagnosis not present

## 2023-04-15 DIAGNOSIS — I132 Hypertensive heart and chronic kidney disease with heart failure and with stage 5 chronic kidney disease, or end stage renal disease: Secondary | ICD-10-CM | POA: Diagnosis present

## 2023-04-15 DIAGNOSIS — I428 Other cardiomyopathies: Secondary | ICD-10-CM | POA: Diagnosis present

## 2023-04-15 DIAGNOSIS — I161 Hypertensive emergency: Secondary | ICD-10-CM | POA: Diagnosis present

## 2023-04-15 DIAGNOSIS — D631 Anemia in chronic kidney disease: Secondary | ICD-10-CM | POA: Diagnosis present

## 2023-04-15 DIAGNOSIS — I330 Acute and subacute infective endocarditis: Secondary | ICD-10-CM | POA: Diagnosis present

## 2023-04-15 DIAGNOSIS — T826XXD Infection and inflammatory reaction due to cardiac valve prosthesis, subsequent encounter: Secondary | ICD-10-CM | POA: Diagnosis not present

## 2023-04-15 DIAGNOSIS — I729 Aneurysm of unspecified site: Secondary | ICD-10-CM | POA: Diagnosis not present

## 2023-04-15 DIAGNOSIS — I25119 Atherosclerotic heart disease of native coronary artery with unspecified angina pectoris: Secondary | ICD-10-CM | POA: Diagnosis not present

## 2023-04-15 LAB — LIPID PANEL
Cholesterol: 170 mg/dL (ref 0–200)
HDL: 45 mg/dL (ref >40)
LDL Cholesterol: 108 mg/dL — ABNORMAL HIGH (ref 0–99)
Total CHOL/HDL Ratio: 3.8 {ratio}
Triglycerides: 85 mg/dL (ref <150)
VLDL: 17 mg/dL (ref 0–40)

## 2023-04-15 LAB — CBC
HCT: 39.6 % (ref 39.0–52.0)
Hemoglobin: 12.7 g/dL — ABNORMAL LOW (ref 13.0–17.0)
MCH: 30.7 pg (ref 26.0–34.0)
MCHC: 32.1 g/dL (ref 30.0–36.0)
MCV: 95.7 fL (ref 80.0–100.0)
Platelets: 78 10*3/uL — ABNORMAL LOW (ref 150–400)
RBC: 4.14 MIL/uL — ABNORMAL LOW (ref 4.22–5.81)
RDW: 14.2 % (ref 11.5–15.5)
WBC: 5.6 10*3/uL (ref 4.0–10.5)
nRBC: 0 % (ref 0.0–0.2)

## 2023-04-15 LAB — TROPONIN I (HIGH SENSITIVITY): Troponin I (High Sensitivity): 13651 ng/L (ref <18)

## 2023-04-15 LAB — ECHOCARDIOGRAM COMPLETE
AV Mean grad: 14 mm[Hg]
AV Peak grad: 23.8 mm[Hg]
Ao pk vel: 2.44 m/s
Area-P 1/2: 3.12 cm2
Height: 71 in
S' Lateral: 3.1 cm
Weight: 2469.15 [oz_av]

## 2023-04-15 LAB — BASIC METABOLIC PANEL
Anion gap: 14 (ref 5–15)
BUN: 43 mg/dL — ABNORMAL HIGH (ref 6–20)
CO2: 25 mmol/L (ref 22–32)
Calcium: 9.5 mg/dL (ref 8.9–10.3)
Chloride: 96 mmol/L — ABNORMAL LOW (ref 98–111)
Creatinine, Ser: 7.16 mg/dL — ABNORMAL HIGH (ref 0.61–1.24)
GFR, Estimated: 9 mL/min — ABNORMAL LOW (ref >60)
Glucose, Bld: 88 mg/dL (ref 70–99)
Potassium: 3.5 mmol/L (ref 3.5–5.1)
Sodium: 135 mmol/L (ref 135–145)

## 2023-04-15 LAB — PHOSPHORUS: Phosphorus: 5.5 mg/dL — ABNORMAL HIGH (ref 2.5–4.6)

## 2023-04-15 LAB — HEMOGLOBIN A1C
Hgb A1c MFr Bld: 5.2 % (ref 4.8–5.6)
Mean Plasma Glucose: 102.54 mg/dL

## 2023-04-15 LAB — PROTIME-INR
INR: 1.1 (ref 0.8–1.2)
Prothrombin Time: 14.5 s (ref 11.4–15.2)

## 2023-04-15 LAB — HEPATITIS B SURFACE ANTIGEN: Hepatitis B Surface Ag: NONREACTIVE

## 2023-04-15 LAB — HEPARIN LEVEL (UNFRACTIONATED)
Heparin Unfractionated: 0.11 [IU]/mL — ABNORMAL LOW (ref 0.30–0.70)
Heparin Unfractionated: 0.2 [IU]/mL — ABNORMAL LOW (ref 0.30–0.70)

## 2023-04-15 SURGERY — LEFT HEART CATH AND CORONARY ANGIOGRAPHY
Anesthesia: LOCAL

## 2023-04-15 MED ORDER — CALCITRIOL 0.25 MCG PO CAPS
0.5000 ug | ORAL_CAPSULE | ORAL | Status: DC
  Administered 2023-04-15 – 2023-04-17 (×2): 0.5 ug via ORAL
  Filled 2023-04-15: qty 2
  Filled 2023-04-15: qty 1
  Filled 2023-04-15: qty 2

## 2023-04-15 MED ORDER — RANOLAZINE ER 500 MG PO TB12
500.0000 mg | ORAL_TABLET | Freq: Two times a day (BID) | ORAL | Status: DC
  Administered 2023-04-15 (×2): 500 mg via ORAL
  Filled 2023-04-15 (×3): qty 1

## 2023-04-15 MED ORDER — CARVEDILOL 25 MG PO TABS
25.0000 mg | ORAL_TABLET | Freq: Two times a day (BID) | ORAL | Status: DC
  Administered 2023-04-15 – 2023-04-18 (×4): 25 mg via ORAL
  Filled 2023-04-15 (×6): qty 1

## 2023-04-15 MED ORDER — HEPARIN BOLUS VIA INFUSION
1000.0000 [IU] | Freq: Once | INTRAVENOUS | Status: AC
  Administered 2023-04-15: 1000 [IU] via INTRAVENOUS
  Filled 2023-04-15: qty 1000

## 2023-04-15 MED ORDER — HEPARIN BOLUS VIA INFUSION
2000.0000 [IU] | Freq: Once | INTRAVENOUS | Status: AC
  Administered 2023-04-15: 2000 [IU] via INTRAVENOUS
  Filled 2023-04-15: qty 2000

## 2023-04-15 MED ORDER — CHLORHEXIDINE GLUCONATE CLOTH 2 % EX PADS
6.0000 | MEDICATED_PAD | Freq: Every day | CUTANEOUS | Status: DC
  Administered 2023-04-17 – 2023-04-18 (×2): 6 via TOPICAL

## 2023-04-15 MED ORDER — HYDROMORPHONE HCL 1 MG/ML IJ SOLN
1.0000 mg | INTRAMUSCULAR | Status: DC | PRN
  Administered 2023-04-16 (×2): 1 mg via INTRAVENOUS
  Filled 2023-04-15 (×2): qty 1

## 2023-04-15 MED ORDER — CHLORHEXIDINE GLUCONATE CLOTH 2 % EX PADS
6.0000 | MEDICATED_PAD | Freq: Every day | CUTANEOUS | Status: DC
  Administered 2023-04-15 – 2023-04-18 (×3): 6 via TOPICAL

## 2023-04-15 NOTE — Assessment & Plan Note (Signed)
-   Patient has known history of aortic valve stenosis status post bioprosthetic aortic valve replacement in 2016.  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 - Echo last month showed degeneration of the prosthetic aortic valve and large pseudoaneurysm with aortic stenosis. -Repeat echo on admission shows valve dehiscence with severely dilated aortic root, 59 mm

## 2023-04-15 NOTE — Assessment & Plan Note (Signed)
See above

## 2023-04-15 NOTE — Progress Notes (Signed)
  Echocardiogram 2D Echocardiogram has been performed.  [name] 04/15/2023, 2:41 PM

## 2023-04-15 NOTE — Progress Notes (Signed)
PHARMACY - ANTICOAGULATION CONSULT NOTE  Pharmacy Consult for heparin Indication: chest pain/ACS  Allergies  Allergen Reactions   Clonidine Derivatives Nausea And Vomiting and Other (See Comments)    Tablets by mouth = Tolerated  Patches on the skin = Nausea & vomiting after wearing 4-5 days    Imdur [Isosorbide Nitrate] Other (See Comments)    Headaches -- patient instructed to continue taking    Tramadol Other (See Comments)    Headaches    Clonidine Nausea And Vomiting    Patient Measurements: Height: 5\' 11"  (180.3 cm) Weight: 70 kg (154 lb 5.2 oz) IBW/kg (Calculated) : 75.3   Vital Signs: Temp: 98.9 F (37.2 C) (01/30 1216) Temp Source: Oral (01/30 1216) BP: 97/64 (01/30 1216) Pulse Rate: 85 (01/30 1216)  Labs: Recent Labs    04/14/23 1020 04/14/23 1344 04/14/23 1701 04/14/23 2111 04/15/23 0101 04/15/23 0550 04/15/23 1046  HGB 13.6  --   --   --   --  12.7*  --   HCT 42.9  --   --   --   --  39.6  --   PLT 89*  --   --   --   --  78*  --   LABPROT  --   --   --   --   --  14.5  --   INR  --   --   --   --   --  1.1  --   HEPARINUNFRC  --   --   --   --  0.11*  --  0.20*  CREATININE 6.62*  --   --   --   --  7.16*  --   TROPONINIHS 400*   < > 8,100* 8,447*  --   --  13,651*   < > = values in this interval not displayed.    Estimated Creatinine Clearance: 12.2 mL/min (A) (by C-G formula based on SCr of 7.16 mg/dL (H)).   Medical History: Past Medical History:  Diagnosis Date   Acute bronchitis 01/09/2023   Acute on chronic combined systolic and diastolic CHF (congestive heart failure) (HCC) 12/08/2017   Acute renal failure superimposed on stage 3 chronic kidney disease (HCC) 01/26/2012   Adenomatous colon polyp 08/2015   Anxiety    Aortic disease (HCC)    Aortic dissection (HCC)    a. admx 04/2014 >> L renal infarct; a/c renal failure >> b.  s/p Bioprosthetic Bentall and total arch replacement and staged endovascular repair of descending aortic  aneurysm ([facility] - Dr. Kizzie Bane)   CAD (coronary artery disease)    a. LHC 4/16:  oD1 60%   Cardiomyopathy (HCC)    a. non-ischemic - probably related to untreated HTN and ETOH abuse - Echo 3/13 with EF 35-40% >> b. Echo 4/16: Severe LVH, EF 55-60%, moderate AI, moderate MR, mild LAE, trivial effusion, known type B dissection with communication between true and false lumens with suprasternal images suggesting dissection plane may propagate to at least left subclavian takeoff, root above aortic valve okay     Cerebral infarction due to embolism of left middle cerebral artery (HCC) 06/03/2016   Cerebrovascular accident (CVA) due to embolism of left middle cerebral artery (HCC)    Chronic abdominal pain    Chronic combined systolic and diastolic congestive heart failure (HCC)    a. 05/2011: Adm with pulm edema/HTN urgency, EF 35-40% with diffuse hypokinesis and moderate to severe mitral regurgitation. Cardiomyopathy likely due to uncontrolled HTN and ETOH  abuse - cath deferred due to renal insufficiency (felt due to uncontrolled HTN). bElzie Rings MV 06/2011: EF 37% and no ischemia or infarction. c. EF 45-50% by echo 01/2012.   Chronic sinusitis    Cryptogenic stroke (HCC) 05/16/2020   DDD (degenerative disc disease), lumbar    Depression    Descending thoracic aortic aneurysm [facility])    Dissecting aneurysm of thoracic aorta (HCC)    Dissecting aneurysm of thoracic aorta, [place] type B (HCC) 04/24/2014   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HCC) 06/07/2015   ESRD (end stage renal disease) (HCC)    on dialysis 1-2 days per week (2 days ordered, only coming 1 day per week as of oct 2024). on dialysis 3-4 years total   ETOH abuse    a. Reported to have quit 05/2011.   Frequent headaches    GERD (gastroesophageal reflux disease)    Headache(784.0)    "q other day" (08/08/2013)   Hemorrhoid thrombosis    History of echocardiogram    Echo 1/17:  Severe LVH, EF 55-60%, no RWMA, Gr 2 DD, AVR ok, mild to mod  MR, mild LAE, mild reduced RVSF, mod RAE   History of medication noncompliance    HYPERLIPIDEMIA    Hypertension    a. Hx of HTN urgency secondary to noncompliance. b. urinary metanephrine and catecholeamine levels normal 2013.  c. Renal art [name] 1/16:  No evidence of renal artery stenosis noted bilaterally.   INGUINAL HERNIA    Pneumonia ~ 2013   Sepsis (HCC) 01/18/2023   Serrated adenoma of colon 08/2015   SIRS (systemic inflammatory response syndrome) (HCC) 01/09/2023   Stroke (HCC)    Tobacco abuse    Valvular heart disease    a. Echo 05/2011: moderate to severe eccentric MR and mild to moderate AI with prolapsing left coronary cusp. b. Echo 01/2012: mild-mod AI, mild dilitation of aortic root, mild MR.;  c. Echo 1/16: Severe LVH consistent with hypertrophic cardio myopathy, EF 50%, no RWMA, mod AI, mild MR, mild RAE, dilated Ao root (40 mm);     Medications:  Medications Prior to Admission  Medication Sig Dispense Refill Last Dose/Taking   acetaminophen (TYLENOL) 500 MG tablet Take 1,000 mg by mouth every 6 (six) hours as needed for mild pain (pain score 1-3), moderate pain (pain score 4-6) or headache.   Taking As Needed   albuterol (VENTOLIN HFA) 108 (90 Base) MCG/ACT inhaler Inhale 1 puff into the lungs every 6 (six) hours as needed for wheezing or shortness of breath.   Past Week   amLODipine (NORVASC) 10 MG tablet TAKE 1 TABLET(10 MG) BY MOUTH DAILY 90 tablet 3 04/13/2023   aspirin EC 81 MG EC tablet Take 1 tablet (81 mg total) by mouth daily. 90 tablet 3 04/14/2023 Morning   carvedilol (COREG) 12.5 MG tablet Take 1 tablet (12.5 mg total) by mouth 2 (two) times daily. 180 tablet 3 04/14/2023 Morning   cephALEXin (KEFLEX) 500 MG capsule Take 2 capsules (1,000 mg total) by mouth daily. Daily at night time, start from 12/9 after completion of IV course. 60 capsule 1 04/13/2023   cloNIDine (CATAPRES) 0.2 MG tablet Take 0.2 mg by mouth 2 (two) times daily.   04/14/2023 Morning   Evolocumab  (REPATHA SURECLICK) 140 MG/ML SOAJ Inject 140 mg into the skin every 14 (fourteen) days. 6 mL 3 Past Month   hydrALAZINE (APRESOLINE) 100 MG tablet Take 0.5 tablets (50 mg total) by mouth 3 (three) times daily.   04/14/2023 Morning  HYDROcodone-acetaminophen (NORCO/VICODIN) 5-325 MG tablet Take 1 tablet by mouth every 8 (eight) hours as needed.   04/14/2023   lidocaine 4 % Place 1 patch onto the skin daily. Apply to lower back   Past Month   lidocaine-prilocaine (EMLA) cream Apply 1 application. topically as needed (prior to port being accessed). Use on dialysis days (Tues, Thurs and Sat)   Taking As Needed   meclizine (ANTIVERT) 25 MG tablet Take 1 tablet (25 mg total) by mouth 3 (three) times daily as needed for dizziness. 30 tablet 0 Taking As Needed   nitroGLYCERIN (NITROSTAT) 0.4 MG SL tablet Place 1 tablet (0.4 mg total) under the tongue every 5 (five) minutes as needed for chest pain. 25 tablet 0 04/14/2023 Morning   oxyCODONE-acetaminophen (PERCOCET) 5-325 MG tablet Take 1 tablet by mouth every 4 (four) hours as needed for severe pain. 20 tablet 0 Taking As Needed   pantoprazole (PROTONIX) 40 MG tablet Take 1 tablet (40 mg total) by mouth daily. 30 tablet 0 Past Month   polyethylene glycol powder (GLYCOLAX/MIRALAX) 17 GM/SCOOP powder Take 17 g by mouth daily. (Patient taking differently: Take 17 g by mouth daily as needed for moderate constipation.) 238 g 0 Past Month   STIOLTO RESPIMAT 2.5-2.5 MCG/ACT AERS Inhale 2 puffs into the lungs at bedtime.   04/13/2023    Assessment: 53 yo male here w/ NSTEMI and pharmacy dosing heparin. Plans noted for medical management (48 hrs heparin) and he is noted on DAPT.  -heparin level= 0.20 -Hg= 12.7, plt= 78 (hx low plt) -heparin started 1/29 ~ 4:30pm  Goal of Therapy:  Heparin level 0.3-0.7 units/ml Monitor platelets by anticoagulation protocol: Yes   Plan:  -Heparin 1000 unit IV bolus and increase infusion to 1250 units/hr -Heparin level in 8  hours and daily wth CBC daily   Harland German, PharmD Clinical Pharmacist **Pharmacist phone directory can now be found on [email or web address] ([email or web address]).  Listed under [facility].

## 2023-04-15 NOTE — Assessment & Plan Note (Signed)
-   Continue breathing treatments - No S/S exacerbation

## 2023-04-15 NOTE — Progress Notes (Signed)
Progress Note    [name]   ZOX:[id number]  DOB: 04/06/1970  DOA: 04/14/2023     0 PCP: Loura Back, NP  Initial CC: CP  Hospital Course: [name] is a 53 y.o. male with medical history significant of hypertension, hyperlipidemia, CVA, seizure, CAD, ESRD on HD, GERD, CHF, anxiety, depression, abdominal aortic dissection s/p repair, COPD, R IJ thrombosis, prosthetic valve endocarditis, aortic stenosis, pericarditis presenting with chest pain. Pain developed during showering and radiated to left shoulder, neck, back. He was brought to the ER due to persistent pain. CT angio chest obtained which was negative for acute abnormalities.  Showed underlying stable postsurgical changes of repair of ascending aorta and endovascular stent graft repair of thoracic aorta. Troponins were elevated on admission and he was started on heparin drip and admitted for cardiology evaluation.  Interval History:  When seen this morning in the ER he stated that his chest pain had resolved and he was feeling back to normal.  He was mostly asking for food and wanting to eat.   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 with large lucent space surrounding the valve consistent with mild dehiscence; severely dilated aortic root, 59 mm, mild AS - discussed with cardiology; patient prognosis considered poor at this time and he would benefit from palliative care involvement and GOC/code discussions  ESRD on dialysis (HCC) - on TTSa HD - nephrology consulted on admission; HD per nephro  Coronary artery disease involving native coronary artery of  native heart with angina pectoris (HCC) - continue DAPT - ranexa added per cardiology; patient noted to not tolerate nitro  History of aortic dissection - Patient has known history of aortic valve stenosis status post bioprosthetic aortic valve replacement in 2016.  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 - Echo last month showed degeneration of the prosthetic aortic valve and large pseudoaneurysm with aortic stenosis. 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  COPD (chronic obstructive pulmonary disease) (HCC) - Continue breathing treatments - No S/S exacerbation  History of stroke - On aspirin, Plavix, Repatha  H/O aortic valve replacement - See above  Essential hypertension - Continue antihypertensives  S/P aortic dissection repair - See above  GERD without esophagitis - Continue PPI  Hyperlipidemia LDL goal <70 - On Repatha outpatient   Old records reviewed in assessment of this patient  Antimicrobials:   DVT prophylaxis:  Heparin drip   Code Status:   Code Status: Full Code  Mobility Assessment (Last 72 Hours)     Mobility Assessment     Row Name 04/15/23 1216           Does patient have an order for bedrest or is patient medically unstable No - Continue assessment       What is the highest level of mobility based on the progressive mobility assessment? Level 6 (Walks independently in room and hall) - Balance  while walking in room without assist - Complete                Barriers to discharge: none Disposition Plan:  Home Status is: Inpt  Objective: Blood pressure 97/64, pulse 85, temperature 98.9 F (37.2 C), temperature  source Oral, resp. rate 18, height 5\' 11"  (1.803 m), weight 70 kg, SpO2 98%.  Examination:  Physical Exam Constitutional:      General: He is not in acute distress.    Appearance: Normal appearance.  HENT:     Head: Normocephalic and atraumatic.     Mouth/Throat:     Mouth: Mucous membranes are moist.  Eyes:     Extraocular Movements: Extraocular movements intact.  Cardiovascular:     Rate and Rhythm: Normal rate and regular rhythm.     Heart sounds: Murmur heard.  Pulmonary:     Effort: Pulmonary effort is normal. No respiratory distress.     Breath sounds: Normal breath sounds. No wheezing.  Abdominal:     General: Bowel sounds are normal. There is no distension.     Palpations: Abdomen is soft.     Tenderness: There is no abdominal tenderness.  Musculoskeletal:        General: Normal range of motion.     Cervical back: Normal range of motion and neck supple.  Skin:    General: Skin is warm and dry.  Neurological:     General: No focal deficit present.     Mental Status: He is alert.  Psychiatric:        Mood and Affect: Mood normal.        Behavior: Behavior normal.      Consultants:  Cardiology Palliative care  Procedures:    Data Reviewed: Results for orders placed or performed during the hospital encounter of 04/14/23 (from the past 24 hours)  Troponin I (High Sensitivity)     Status: Abnormal   Collection Time: 04/14/23  5:01 PM  Result Value Ref Range   Troponin I (High Sensitivity) 8,100 (HH) <18 ng/L  Magnesium     Status: None   Collection Time: 04/14/23  5:01 PM  Result Value Ref Range   Magnesium 2.2 1.7 - 2.4 mg/dL  Troponin I (High Sensitivity)     Status: Abnormal   Collection Time: 04/14/23  9:11 PM  Result Value Ref Range   Troponin I (High Sensitivity) 8,447 (HH) <18 ng/L  Heparin level (unfractionated)     Status: Abnormal   Collection Time: 04/15/23  1:01 AM  Result Value Ref Range   Heparin Unfractionated 0.11 (L) 0.30 - 0.70 IU/mL   Lipid panel     Status: Abnormal   Collection Time: 04/15/23  5:00 AM  Result Value Ref Range   Cholesterol 170 0 - 200 mg/dL   Triglycerides 85 <161 mg/dL   HDL 45 >09 mg/dL   Total CHOL/HDL Ratio 3.8 RATIO   VLDL 17 0 - 40 mg/dL   LDL Cholesterol 604 (H) 0 - 99 mg/dL  Hemoglobin V4U     Status: None   Collection Time: 04/15/23  5:00 AM  Result Value Ref Range   Hgb A1c MFr Bld 5.2 4.8 - 5.6 %   Mean Plasma Glucose 102.54 mg/dL  CBC     Status: Abnormal   Collection Time: 04/15/23  5:50 AM  Result Value Ref Range   WBC 5.6 4.0 - 10.5 K/uL   RBC 4.14 (L) 4.22 - 5.81 MIL/uL   Hemoglobin 12.7 (L) 13.0 -  17.0 g/dL   HCT 16.1 09.6 - 04.5 %   MCV 95.7 80.0 - 100.0 fL   MCH 30.7 26.0 - 34.0 pg   MCHC 32.1 30.0 - 36.0 g/dL   RDW 40.9 81.1 - 91.4 %   Platelets 78 (L) 150 - 400 K/uL   nRBC 0.0 0.0 - 0.2 %  Basic metabolic panel     Status: Abnormal   Collection Time: 04/15/23  5:50 AM  Result Value Ref Range   Sodium 135 135 - 145 mmol/L   Potassium 3.5 3.5 - 5.1 mmol/L   Chloride 96 (L) 98 - 111 mmol/L   CO2 25 22 - 32 mmol/L   Glucose, Bld 88 70 - 99 mg/dL   BUN 43 (H) 6 - 20 mg/dL   Creatinine, Ser 7.82 (H) 0.61 - 1.24 mg/dL   Calcium 9.5 8.9 - 95.6 mg/dL   GFR, Estimated 9 (L) >60 mL/min   Anion gap 14 5 - 15  Protime-INR     Status: None   Collection Time: 04/15/23  5:50 AM  Result Value Ref Range   Prothrombin Time 14.5 11.4 - 15.2 seconds   INR 1.1 0.8 - 1.2  Heparin level (unfractionated)     Status: Abnormal   Collection Time: 04/15/23 10:46 AM  Result Value Ref Range   Heparin Unfractionated 0.20 (L) 0.30 - 0.70 IU/mL  Troponin I (High Sensitivity)     Status: Abnormal   Collection Time: 04/15/23 10:46 AM  Result Value Ref Range   Troponin I (High Sensitivity) 13,651 (HH) <18 ng/L  Hepatitis B surface antigen     Status: None   Collection Time: 04/15/23 10:46 AM  Result Value Ref Range   Hepatitis B Surface Ag NON REACTIVE NON REACTIVE  Phosphorus      Status: Abnormal   Collection Time: 04/15/23 10:46 AM  Result Value Ref Range   Phosphorus 5.5 (H) 2.5 - 4.6 mg/dL    I have reviewed pertinent nursing notes, vitals, labs, and images as necessary. I have ordered labwork to follow up on as indicated.  I have reviewed the last notes from staff over past 24 hours. I have discussed patient's care plan and test results with nursing staff, CM/SW, and other staff as appropriate.  Time spent: Greater than 50% of the 55 minute visit was spent in counseling/coordination of care for the patient as laid out in the A&P.   LOS: 0 days   Lewie Chamber, MD [facility] 04/15/2023, 4:25 PM

## 2023-04-15 NOTE — Assessment & Plan Note (Signed)
Continue PPI ?

## 2023-04-15 NOTE — Assessment & Plan Note (Addendum)
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

## 2023-04-15 NOTE — Assessment & Plan Note (Addendum)
-   continue DAPT - patient noted to not tolerate nitroglycerin -Continue Dilaudid for pain control

## 2023-04-15 NOTE — Progress Notes (Signed)
Pt admitted to rm 8 from ED. Oriented pt to the unit. Initiated tele. VSS. Call bell within reach.   Lawson Radar, RN

## 2023-04-15 NOTE — Assessment & Plan Note (Signed)
On Repatha outpatient.

## 2023-04-15 NOTE — Assessment & Plan Note (Signed)
-   on TTSa HD - nephrology consulted on admission; HD per nephro

## 2023-04-15 NOTE — Assessment & Plan Note (Signed)
-   On chronic suppressive treatment with Keflex

## 2023-04-15 NOTE — Assessment & Plan Note (Signed)
-   Continue antihypertensives

## 2023-04-15 NOTE — Progress Notes (Addendum)
Rounding Note    Patient Name: [name] Date of Encounter: 04/15/2023  [facility] Cardiologist: Marca Ancona, MD   Subjective   He is reporting chest pain and is quite annoyed.  He does not want to take nitroglycerin.  He is having a headache.   Inpatient Medications    Scheduled Meds:  amLODipine  10 mg Oral Daily   arformoterol  15 mcg Nebulization BID   And   umeclidinium bromide  1 puff Inhalation Daily   aspirin  81 mg Oral Daily   carvedilol  12.5 mg Oral BID WC   cephALEXin  1,000 mg Oral Daily   Chlorhexidine Gluconate Cloth  6 each Topical Q0600   cloNIDine  0.2 mg Oral BID   clopidogrel  75 mg Oral Daily   hydrALAZINE  50 mg Oral TID   pantoprazole  40 mg Oral Daily   Continuous Infusions:  heparin 1,100 Units/hr (04/15/23 0233)   PRN Meds: acetaminophen, ondansetron (ZOFRAN) IV, oxyCODONE-acetaminophen   Vital Signs    Vitals:   04/14/23 2300 04/15/23 0415 04/15/23 0545 04/15/23 0930  BP:  (!) 111/90    Pulse:  86 93 89  Resp:  15 17 15   Temp:   97.8 F (36.6 C)   TempSrc:      SpO2:  100% 100% 100%  Weight: 70 kg     Height: 5\' 11"  (1.803 m)       Intake/Output Summary (Last 24 hours) at 04/15/2023 1022 Last data filed at 04/15/2023 0101 Gross per 24 hour  Intake --  Output 225 ml  Net -225 ml      04/14/2023   11:00 PM 04/14/2023   10:00 PM 03/04/2023   11:45 AM  Last 3 Weights  Weight (lbs) 154 lb 5.2 oz 154 lb 5.2 oz 136 lb 14.5 oz  Weight (kg) 70 kg 70 kg 62.1 kg      Telemetry    Sinus rhythm- Personally Reviewed  ECG    Sinus rhythm LVH with repull- Personally Reviewed  Physical Exam   Vitals:   04/15/23 0545 04/15/23 0930  BP:    Pulse: 93 89  Resp: 17 15  Temp: 97.8 F (36.6 C)   SpO2: 100% 100%    GEN: No acute distress.   Neck: No JVD Cardiac: tachycardic, +++Diastolic murmur Respiratory: Clear to auscultation bilaterally. GI: Soft, nontender, non-distended  MS: No edema; No  deformity. Neuro:  Nonfocal  Psych: annoyed  Labs    High Sensitivity Troponin:   Recent Labs  Lab 04/14/23 1020 04/14/23 1344 04/14/23 1701 04/14/23 2111  TROPONINIHS 400* 4,234* 8,100* 8,447*     Chemistry Recent Labs  Lab 04/14/23 1020 04/14/23 1701 04/15/23 0550  NA 138  --  135  K 3.4*  --  3.5  CL 96*  --  96*  CO2 24  --  25  GLUCOSE 104*  --  88  BUN 41*  --  43*  CREATININE 6.62*  --  7.16*  CALCIUM 10.0  --  9.5  MG  --  2.2  --   GFRNONAA 9*  --  9*  ANIONGAP 18*  --  14    Lipids  Recent Labs  Lab 04/15/23 0500  CHOL 170  TRIG 85  HDL 45  LDLCALC 108*  CHOLHDL 3.8    Hematology Recent Labs  Lab 04/14/23 1020 04/15/23 0550  WBC 6.1 5.6  RBC 4.44 4.14*  HGB 13.6 12.7*  HCT 42.9  39.6  MCV 96.6 95.7  MCH 30.6 30.7  MCHC 31.7 32.1  RDW 14.2 14.2  PLT 89* 78*   Thyroid No results for input(s): "TSH", "FREET4" in the last 168 hours.  BNPNo results for input(s): "BNP", "PROBNP" in the last 168 hours.  DDimer No results for input(s): "DDIMER" in the last 168 hours.   Radiology    CT Angio Chest/Abd/Pel for Dissection W and/or Wo Contrast Result Date: 04/14/2023 CLINICAL DATA:  Chest pain radiating to the back. Acute aortic syndrome suspected. EXAM: CT ANGIOGRAPHY CHEST, ABDOMEN AND PELVIS TECHNIQUE: Non-contrast CT of the chest was initially obtained. Multidetector CT imaging through the chest, abdomen and pelvis was performed using the standard protocol during bolus administration of intravenous contrast. Multiplanar reconstructed images and MIPs were obtained and reviewed to evaluate the vascular anatomy. RADIATION DOSE REDUCTION: This exam was performed according to the departmental dose-optimization program which includes automated exposure control, adjustment of the mA and/or kV according to patient size and/or use of iterative reconstruction technique. CONTRAST:  75mL OMNIPAQUE IOHEXOL 350 MG/ML SOLN COMPARISON:  CT dated 03/02/2023. FINDINGS:  Evaluation of this exam is limited due to respiratory motion and streak artifact caused by patient's arms. Evaluation is also limited due to loss of intra-abdominal fat and cachexia. CTA CHEST FINDINGS Cardiovascular: Stable cardiomegaly. No pericardial effusion. Coronary vascular calcification. Mechanical aortic valve. Postsurgical changes of the repair of the ascending aorta and endovascular stent graft repair of the thoracic aorta similar to prior CT. The stent graft appears patent. No contrast noted outside of the confines of the stent graft. No periaortic fluid collection or hematoma. The origins of the great vessels of the aortic arch appear patent as visualized. The central pulmonary arteries appear patent. Mediastinum/Nodes: No hilar or mediastinal adenopathy. The esophagus is grossly unremarkable. No mediastinal fluid collection. Lungs/Pleura: Trace left pleural effusion. No focal consolidation, or pneumothorax. The central airways are patent. Musculoskeletal: No acute osseous pathology. Left anterior chest wall implantable cardiac device. Median sternotomy wires. Review of the MIP images confirms the above findings. CTA ABDOMEN AND PELVIS FINDINGS VASCULAR Aorta: Similar appearance of postsurgical changes of the upper abdominal aorta with irregularity of the wall. No aortic dissection. A 4 cm distal abdominal aortic aneurysm similar to prior CT. A 3.3 x 3.4 cm thrombosed aneurysm sac from the proximal abdominal aorta similar to prior CT. Celiac: Postsurgical changes adjacent to the celiac trunk. The celiac artery and its major branches appear patent. SMA: The SMA is patent. Renals: The renal arteries appear patent. IMA: The IMA is patent. Inflow: Moderate atherosclerotic calcification. 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 No dissection. The iliac arteries are patent. Veins: The IVC is unremarkable.  No portal venous gas. Review of the  MIP images confirms the above findings. NON-VASCULAR No intra-abdominal free air.  Small free fluid in the pelvis. Hepatobiliary: Small cyst in the dome of the liver. No biliary duct dilatation. Small calcified gallstone. No pericholecystic fluid. Pancreas: The pancreas is unremarkable as visualized. Spleen: The spleen is unremarkable Adrenals/Urinary Tract: The adrenal glands are unremarkable. There is no hydronephrosis on either side. There is hypoenhancement of the left renal parenchyma suggestive of hypoperfusion. There is mild left renal parenchyma atrophy. There is no hydronephrosis on either side. The urinary bladder is grossly unremarkable. Stomach/Bowel: Evaluation of the bowel is limited in the absence of oral contrast and due to factors above. No bowel obstruction. The appendix is  unremarkable as visualized. Lymphatic: No obvious adenopathy. Reproductive: The prostate and seminal vesicles are grossly remarkable. Other: Loss of subcutaneous fat and cachexia. Musculoskeletal: Degenerative changes of the spine. No acute osseous pathology. Review of the MIP images confirms the above findings. IMPRESSION: 1. No acute intrathoracic, abdominal, or pelvic pathology. No aortic dissection. 2. Stable postsurgical changes of the repair of the ascending aorta and endovascular stent graft repair of the thoracic aorta. 3. A 4 cm distal abdominal aortic aneurysm similar to prior CT. Recommend follow-up CT or MR as appropriate in 12 months and referral to or continued care with vascular specialist. (Ref.: J Vasc Surg. 2018; 67:2-77 and J Am Coll Radiol 2013;10(10):789-794.) 4. 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 5. Cholelithiasis. 6. Hypoperfusion of the left kidney.  No hydronephrosis. Electronically Signed   By: Elgie Collard M.D.   On: 04/14/2023 15:42   DG Chest 2 View Result Date: 04/14/2023 CLINICAL DATA:  Chest pain. EXAM: CHEST - 2 VIEW  COMPARISON:  03/02/2023. FINDINGS: Bilateral lung fields are clear. Bilateral costophrenic angles are clear. Stable cardio-mediastinal silhouette. Sternotomy wires, loop recorder device and vascular stents again noted. No acute osseous abnormalities. The soft tissues are within normal limits. IMPRESSION: No active cardiopulmonary disease. Electronically Signed   By: Jules Schick M.D.   On: 04/14/2023 11:40    Cardiac Studies   TTE 02/15/2023 1. Severe biventricular hypertrophy, reasonable to rule out amyloid. Left  ventricular ejection fraction, by estimation, is 55 to 60%. The left  ventricle has normal function. The left ventricle has no regional wall  motion abnormalities. There is severe  left ventricular hypertrophy. Left ventricular diastolic parameters are  consistent with Grade I diastolic dysfunction (impaired relaxation).   2. Right ventricular systolic function is mildly reduced. The right  ventricular size is normal. Moderately increased right ventricular wall  thickness.   3. Left atrial size was severely dilated.   4. Right atrial size was severely dilated.   5. The mitral valve is normal in structure. Mild mitral valve  regurgitation.   6. 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 There does not appear to be any significant paravalvular  leak or leaflet involvement. As noted previously, significant prosthetic  valve degeneration and stenosis.  The aortic valve has been repaired/replaced. There is mild calcification  of the aortic valve. There is moderate thickening of the aortic valve.  Aortic valve regurgitation is not visualized. Echo findings are consistent  with stenosis of the aortic  prosthesis. Aortic valve area, by VTI measures 1.36 cm. Aortic valve mean  gradient measures 30.5 mmHg. Aortic valve Vmax measures 3.80 m/s.   7. Patient with previous aortic Bentall procedure in 2016. There appears  to be a large  pseudoaneursym primarily anterior to the aortic valve best  seen adjacent to the right coronary cusp but extending circumferentially.  Aortic dilatation noted.   8. Moderately dilated pulmonary artery.   9. The inferior vena cava is normal in size with greater than 50%  respiratory variability, suggesting right atrial pressure of 3 mmHg.   Patient Profile     Mr. Rico has a complicated hx of NICM, ETOH, aortic dissection s/p Bentall with AVR and arch repair in 2016 at [facility] by significant dehiscence with no plans for surgical intervention due to risk], with staged endovascular repair of descending aortic aneurysm, ESRD on HD, CAD s/p RI PCI  in 2019, who presents with CP. Here with NSTEMI.   Assessment & Plan  NSTEMI Delta troponin [phone].  He presented with acute chest pain.  EF is normal.  LV hypertrophy likely in the setting of end-stage renal disease.  -Pending repeat echo -Discussed his case with interventional and it was deemed that it would be too high risk to try an additional left heart catheterization.  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  He is not a candidate for redo surgery. -Will add ranolazine -He does not tolerate nitro -Agree with Percocet and can consider palliative care consult to help with pain management  Hypertension Okay control continue to monitor -Will increase his carvedilol to 25 mg twice daily -Continue amlodipine 10 mg daily -Continue hydralazine 50 mg 3 times daily -On clonidine 0.2 mg twice daily  ESRD - IHD   Time Spent Directly with Patient:  I have spent a total of 35 minutes with the patient reviewing hospital notes, telemetry,  EKGs, labs and examining the patient as well as establishing an assessment and plan that was discussed personally with the patient.     For questions or updates, please contact [facility] Please consult [email or web address] for contact info under        Signed, Maisie Fus, MD  04/15/2023, 10:22 AM

## 2023-04-15 NOTE — Progress Notes (Signed)
PHARMACY - ANTICOAGULATION CONSULT NOTE  Pharmacy Consult for heparin Indication:  NSTEMI  Labs: Recent Labs    04/14/23 1020 04/14/23 1344 04/14/23 1701 04/14/23 2111 04/15/23 0101  HGB 13.6  --   --   --   --   HCT 42.9  --   --   --   --   PLT 89*  --   --   --   --   HEPARINUNFRC  --   --   --   --  0.11*  CREATININE 6.62*  --   --   --   --   TROPONINIHS 400* 4,234* 8,100* 8,447*  --     Assessment: 52yo male subtherapeutic on heparin with initial dosing for NSTEMI; no infusion issues or signs of bleeding per RN.  Goal of Therapy:  Heparin level 0.3-0.7 units/ml   Plan:  2000 units heparin bolus. Increase heparin infusion by 3 units/kg/hr to 1100 units/hr. Check level in 8 hours.   Vernard Gambles, PharmD, BCPS 04/15/2023 2:07 AM

## 2023-04-15 NOTE — Consult Note (Addendum)
[facility] Renal Consultation Note  Indication for Consultation:  Management of ESRD/hemodialysis; anemia, hypertension/volume and secondary hyperparathyroidism  HPI: [name] is a 53 y.o. male. With ESRD on HD TTS ([facility], no recent missed txs) CAD, AOS, prosthetic valve endocarditis Finished IV abx  ( keflex indef. ) [facility] card recently deemed not candidate for aortic valve replacement.  , abdominal aortic dissection s/p repair, COPD ( smoker)  and other med prob as listed below  admitted  with CP / NSTEMI.   K 3.5, HGB 12.7,  CKR  = No active card./pulm  Dz     Seen in ER ,on Iv Hep , denies SOB , min CP  improved from admit . Tolerating op HD recently sl below edw .  [name] Plan for HD  today , work around Cath       Past Medical History:  Diagnosis Date   Acute bronchitis 01/09/2023   Acute on chronic combined systolic and diastolic CHF (congestive heart failure) (HCC) 12/08/2017   Acute renal failure superimposed on stage 3 chronic kidney disease (HCC) 01/26/2012   Adenomatous colon polyp 08/2015   Anxiety    Aortic disease (HCC)    Aortic dissection (HCC)    a. admx 04/2014 >> L renal infarct; a/c renal failure >> b.  s/p Bioprosthetic Bentall and total arch replacement and staged endovascular repair of descending aortic aneurysm ([facility] - Dr. Kizzie Bane)   CAD (coronary artery disease)    a. LHC 4/16:  oD1 60%   Cardiomyopathy (HCC)    a. non-ischemic - probably related to untreated HTN and ETOH abuse - Echo 3/13 with EF 35-40% >> b. Echo 4/16: Severe LVH, EF 55-60%, moderate AI, moderate MR, mild LAE, trivial effusion, known type B dissection with communication between true and false lumens with suprasternal images suggesting dissection plane may propagate to at least left subclavian takeoff, root above aortic valve okay     Cerebral infarction due to embolism of left middle cerebral artery (HCC) 06/03/2016   Cerebrovascular accident (CVA) due to embolism of left  middle cerebral artery (HCC)    Chronic abdominal pain    Chronic combined systolic and diastolic congestive heart failure (HCC)    a. 05/2011: Adm with pulm edema/HTN urgency, EF 35-40% with diffuse hypokinesis and moderate to severe mitral regurgitation. Cardiomyopathy likely due to uncontrolled HTN and ETOH abuse - cath deferred due to renal insufficiency (felt due to uncontrolled HTN). bElzie Rings MV 06/2011: EF 37% and no ischemia or infarction. c. EF 45-50% by echo 01/2012.   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 on dialysis 3-4 years total   ETOH abuse    a. Reported to have quit 05/2011.   Frequent headaches    GERD (gastroesophageal reflux disease)    Headache(784.0)    "q other day" (08/08/2013)   Hemorrhoid thrombosis    History of echocardiogram    Echo 1/17:  Severe LVH, EF 55-60%, no RWMA, Gr 2 DD, AVR ok, mild to mod MR, mild  LAE, mild reduced RVSF, mod RAE   History of medication noncompliance    HYPERLIPIDEMIA    Hypertension    a. Hx of HTN urgency secondary to noncompliance. b. urinary metanephrine and catecholeamine levels normal 2013.  c. Renal art [name] 1/16:  No evidence of renal artery stenosis noted bilaterally.   INGUINAL HERNIA    Pneumonia ~ 2013   Sepsis (HCC) 01/18/2023   Serrated adenoma of colon 08/2015   SIRS (systemic inflammatory response syndrome) (HCC) 01/09/2023   Stroke (HCC)    Tobacco abuse    Valvular heart disease    a. Echo 05/2011: moderate to severe eccentric MR and mild to moderate AI with prolapsing left coronary cusp.  b. Echo 01/2012: mild-mod AI, mild dilitation of aortic root, mild MR.;  c. Echo 1/16: Severe LVH consistent with hypertrophic cardio myopathy, EF 50%, no RWMA, mod AI, mild MR, mild RAE, dilated Ao root (40 mm);     Past Surgical History:  Procedure Laterality Date   A/V FISTULAGRAM Left 03/07/2021   Procedure: A/V FISTULAGRAM;  Surgeon: Chuck Hint, MD;  Location: [facility] INVASIVE CV LAB;  Service: Cardiovascular;  Laterality: Left;   A/V FISTULAGRAM Left 07/18/2021   Procedure: A/V Fistulagram;  Surgeon: Chuck Hint, MD;  Location: [facility] INVASIVE CV LAB;  Service: Cardiovascular;  Laterality: Left;   A/V FISTULAGRAM Left 12/25/2022   Procedure: A/V Fistulagram;  Surgeon: Leonie Douglas, MD;  Location: [facility] INVASIVE CV LAB;  Service: Cardiovascular;  Laterality: Left;   ANKLE SURGERY Bilateral    Fractures bilaterally   AORTIC VALVE SURGERY  09/2014   AV FISTULA PLACEMENT Left 09/09/2020   Procedure: LEFT ARM ARTERIOVENOUS (AV) FISTULA CREATION;  Surgeon: Leonie Douglas, MD;  Location: [facility] OR;  Service: Vascular;  Laterality: Left;   BASCILIC VEIN TRANSPOSITION Left 10/09/2020   Procedure: LEFT SECOND STAGE BASILIC VEIN TRANSPOSITION;  Surgeon: Leonie Douglas, MD;  Location: [facility] OR;  Service: Vascular;  Laterality: Left;  PERIPHERAL NERVE BLOCK   CORONARY ANGIOGRAPHY N/A 12/06/2017   Procedure: CORONARY ANGIOGRAPHY;  Surgeon: Lyn Records, MD;  Location: [facility] INVASIVE CV LAB;  Service: Cardiovascular;  Laterality: N/A;   CORONARY ANGIOGRAPHY N/A 09/26/2018   Procedure: CORONARY ANGIOGRAPHY;  Surgeon: Yvonne Kendall, MD;  Location: [facility] INVASIVE CV LAB;  Service: Cardiovascular;  Laterality: N/A;   CORONARY STENT INTERVENTION N/A 12/10/2017   Procedure: CORONARY STENT INTERVENTION;  Surgeon: Lennette Bihari, MD;  Location: [facility] INVASIVE CV LAB;  Service: Cardiovascular;  Laterality: N/A;   FOOT FRACTURE SURGERY Bilateral 2004-2010   "got pins in both of them"   HEMORRHOID SURGERY N/A  06/15/2015   Procedure: HEMORRHOIDECTOMY;  Surgeon: Almond Lint, MD;  Location: [facility] OR;  Service: General;  Laterality: N/A;   INGUINAL HERNIA REPAIR Right ~ 1996   INSERTION OF DIALYSIS CATHETER N/A 09/09/2020   Procedure: INSERTION OF TUNNELED DIALYSIS PALINDROME 19cm CATHETER;  Surgeon: Leonie Douglas, MD;  Location: [facility] OR;  Service: Vascular;  Laterality: N/A;   LEFT HEART CATH AND CORONARY ANGIOGRAPHY N/A 02/05/2021   Procedure: LEFT HEART CATH AND CORONARY ANGIOGRAPHY;  Surgeon: Laurey Morale, MD;  [facility] INVASIVE CV LAB;  Service: Cardiovascular;  Laterality: N/A;   LEFT HEART CATH AND CORONARY ANGIOGRAPHY N/A 01/22/2023   Procedure: LEFT HEART CATH AND CORONARY ANGIOGRAPHY;  Surgeon: Swaziland, Peter M, MD;  Location: [facility] INVASIVE CV LAB;  Service: Cardiovascular;  Laterality: N/A;   LEFT HEART CATHETERIZATION WITH CORONARY ANGIOGRAM N/A 06/21/2014  Procedure: LEFT HEART CATHETERIZATION WITH CORONARY ANGIOGRAM;  Surgeon: Laurey Morale, MD;  Location: [facility] CATH LAB;  Service: Cardiovascular;  Laterality: N/A;   LOOP RECORDER INSERTION N/A 06/19/2016   Procedure: Loop Recorder Insertion;  Surgeon: [facility] Salvia, MD;  Location: [facility] INVASIVE CV LAB;  Service: Cardiovascular;  Laterality: N/A;   PERIPHERAL VASCULAR BALLOON ANGIOPLASTY  03/07/2021   Procedure: PERIPHERAL VASCULAR BALLOON ANGIOPLASTY;  Surgeon: Chuck Hint, MD;  Location: [facility] INVASIVE CV LAB;  Service: Cardiovascular;;   PERIPHERAL VASCULAR BALLOON ANGIOPLASTY  07/18/2021   Procedure: PERIPHERAL VASCULAR BALLOON ANGIOPLASTY;  Surgeon: Chuck Hint, MD;  Location: [facility] INVASIVE CV LAB;  Service: Cardiovascular;;   PERIPHERAL VASCULAR INTERVENTION Left 12/25/2022   Procedure: PERIPHERAL VASCULAR INTERVENTION;  Surgeon: Leonie Douglas, MD;  [facility] INVAS[facility] CV LAB;  Service: Cardiovascular;  Laterality: Left;  stent to brachiocephalic vein   PERIPHERAL VASCULAR ULTRASOUND/IVUS Left 12/25/2022   Procedure:  Peripheral Vascular Ultrasound/IVUS;  Surgeon: Leonie Douglas, MD;  Location: [facility] INVASIVE CV LAB;  Service: Cardiovascular;  Laterality: Left;   TEE WITHOUT CARDIOVERSION N/A 03/12/2016   Procedure: TRANSESOPHAGEAL ECHOCARDIOGRAM (TEE);  Surgeon: Thurmon Fair, MD;  Location: [facility] ENDOSCOPY;  Service: Cardiovascular;  Laterality: N/A;   TEE WITHOUT CARDIOVERSION N/A 10/16/2020   Procedure: TRANSESOPHAGEAL ECHOCARDIOGRAM (TEE);  Surgeon: Laurey Morale, MD;  Location: [facility] ENDOSCOPY;  Service: Cardiovascular;  Laterality: N/A;   TRANSESOPHAGEAL ECHOCARDIOGRAM (CATH LAB) N/A 01/13/2023   Procedure: TRANSESOPHAGEAL ECHOCARDIOGRAM;  Surgeon: Quintella Reichert, MD;  Location: [facility] INVASIVE CV LAB;  Service: Cardiovascular;  Laterality: N/A;   UPPER EXTREMITY VENOGRAPHY Right 07/18/2021   Procedure: UPPER EXTREMITY VENOGRAPHY;  Surgeon: Chuck Hint, MD;  Location: [facility] INVASIVE CV LAB;  Service: Cardiovascular;  Laterality: Right;      Family History  Problem Relation Age of Onset   Hypertension Mother    Hypertension Other    Colon cancer Paternal Uncle    Stroke Maternal Aunt    Heart attack Brother    Hypertension Brother    Diabetes Maternal Aunt    Lung cancer Maternal Uncle    Stroke Maternal Uncle    Other Sister        "breathing machine at night"   Headache Sister    Thyroid disease Sister    Throat cancer Neg Hx    Pancreatic cancer Neg Hx    Esophageal cancer Neg Hx    Kidney disease Neg Hx    Liver disease Neg Hx       reports that he has been smoking cigarettes. He has a 5.8 pack-year smoking history. He has been exposed to tobacco smoke. He has never used smokeless tobacco. He reports that he does not currently use alcohol after a past usage of about 1.0 standard drink of alcohol per week. He reports current drug use. Drug: Marijuana.   Allergies  Allergen Reactions   Clonidine Derivatives Nausea And Vomiting and Other (See Comments)    Tablets by mouth =  Tolerated  Patches on the skin = Nausea & vomiting after wearing 4-5 days    Imdur [Isosorbide Nitrate] Other (See Comments)    Headaches -- patient instructed to continue taking    Tramadol Other (See Comments)    Headaches    Clonidine Nausea And Vomiting    Prior to Admission medications   Medication Sig Start Date End Date Taking? Authorizing Provider  acetaminophen (TYLENOL) 500 MG tablet Take 1,000 mg by mouth every 6 (six) hours as  needed for mild pain (pain score 1-3), moderate pain (pain score 4-6) or headache.   Yes [provider]  albuterol (VENTOLIN HFA) 108 (90 Base) MCG/ACT inhaler Inhale 1 puff into the lungs every 6 (six) hours as needed for wheezing or shortness of breath. 05/24/21  Yes [provider]  amLODipine (NORVASC) 10 MG tablet TAKE 1 TABLET(10 MG) BY MOUTH DAILY 07/15/21  Yes Laurey Morale, MD  aspirin EC 81 MG EC tablet Take 1 tablet (81 mg total) by mouth daily. 12/12/17  Yes [facility], Roe Rutherford, PA  carvedilol (COREG) 12.5 MG tablet Take 1 tablet (12.5 mg total) by mouth 2 (two) times daily. 02/01/23 05/02/23 Yes Milford, Anderson Malta, FNP  cephALEXin (KEFLEX) 500 MG capsule Take 2 capsules (1,000 mg total) by mouth daily. Daily at night time, start from 12/9 after completion of IV course. 02/17/23  Yes Odette Fraction, MD  cloNIDine (CATAPRES) 0.2 MG tablet Take 0.2 mg by mouth 2 (two) times daily.   Yes [provider]  Evolocumab (REPATHA SURECLICK) 140 MG/ML SOAJ Inject 140 mg into the skin every 14 (fourteen) days. 10/28/22  Yes Hilty, Lisette Abu, MD  hydrALAZINE (APRESOLINE) 100 MG tablet Take 0.5 tablets (50 mg total) by mouth 3 (three) times daily. 01/14/23  Yes Burnadette Pop, MD  HYDROcodone-acetaminophen (NORCO/VICODIN) 5-325 MG tablet Take 1 tablet by mouth every 8 (eight) hours as needed. 01/29/23  Yes [provider]  lidocaine 4 % Place 1 patch onto the skin daily. Apply to lower back   Yes [provider]   lidocaine-prilocaine (EMLA) cream Apply 1 application. topically as needed (prior to port being accessed). Use on dialysis days (Tues, Thurs and Sat)   Yes [provider]  meclizine (ANTIVERT) 25 MG tablet Take 1 tablet (25 mg total) by mouth 3 (three) times daily as needed for dizziness. 12/21/22  Yes Vanetta Mulders, MD  nitroGLYCERIN (NITROSTAT) 0.4 MG SL tablet Place 1 tablet (0.4 mg total) under the tongue every 5 (five) minutes as needed for chest pain. 01/14/23  Yes Burnadette Pop, MD  oxyCODONE-acetaminophen (PERCOCET) 5-325 MG tablet Take 1 tablet by mouth every 4 (four) hours as needed for severe pain. 12/25/22 12/25/23 Yes Leonie Douglas, MD  pantoprazole (PROTONIX) 40 MG tablet Take 1 tablet (40 mg total) by mouth daily. 01/23/23  Yes Marinda Elk, MD  polyethylene glycol powder [facility]) 17 GM/SCOOP powder Take 17 g by mouth daily. Patient taking differently: Take 17 g by mouth daily as needed for moderate constipation. 01/14/23  Yes Burnadette Pop, MD  STIOLTO RESPIMAT 2.5-2.5 MCG/ACT AERS Inhale 2 puffs into the lungs at bedtime. 12/29/22  Yes [provider]      Results for orders placed or performed during the hospital encounter of 04/14/23 (from the past 48 hours)  Basic metabolic panel     Status: Abnormal   Collection Time: 04/14/23 10:20 AM  Result Value Ref Range   Sodium 138 135 - 145 mmol/L   Potassium 3.4 (L) 3.5 - 5.1 mmol/L   Chloride 96 (L) 98 - 111 mmol/L   CO2 24 22 - 32 mmol/L   Glucose, Bld 104 (H) 70 - 99 mg/dL    Comment: Glucose reference range applies only to samples taken after fasting for at least 8 hours.   BUN 41 (H) 6 - 20 mg/dL   Creatinine, Ser 9.60 (H) 0.61 - 1.24 mg/dL   Calcium 45.4 8.9 - 09.8 mg/dL   GFR, Estimated 9 (L) >60  mL/min    Comment: (NOTE) Calculated using the CKD-EPI Creatinine Equation (2021)    Anion gap 18 (H) 5 - 15    Comment: Performed at [facility] Lab, [address],  [place], [place] [address]  Troponin I (High Sensitivity)     Status: Abnormal   Collection Time: 04/14/23 10:20 AM  Result Value Ref Range   Troponin I (High Sensitivity) 400 (HH) <18 ng/L    Comment: CRITICAL RESULT CALLED TO, READ BACK BY AND VERIFIED WITH S MEREDITH RN 04/14/2023 1243 BNUNNERY (NOTE) Elevated high sensitivity troponin I (hsTnI) values and significant  changes across serial measurements may suggest ACS but many other  chronic and acute conditions are known to elevate hsTnI results.  Refer to the "Links" section for chest pain algorithms and additional  guidance. Performed at [facility], [address], [place], [place] [address]   CBC     Status: Abnormal   Collection Time: 04/14/23 10:20 AM  Result Value Ref Range   WBC 6.1 4.0 - 10.5 K/uL   RBC 4.44 4.22 - 5.81 MIL/uL   Hemoglobin 13.6 13.0 - 17.0 g/dL   HCT 44.0 34.7 - 42.5 %   MCV 96.6 80.0 - 100.0 fL   MCH 30.6 26.0 - 34.0 pg   MCHC 31.7 30.0 - 36.0 g/dL   RDW 95.6 38.7 - 56.4 %   Platelets 89 (L) 150 - 400 K/uL    Comment: SPECIMEN CHECKED FOR CLOTS REPEATED TO VERIFY    nRBC 0.0 0.0 - 0.2 %    Comment: Performed at [facility] Lab, [address] [place], [place] [address]  Troponin I (High Sensitivity)     Status: Abnormal   Collection Time: 04/14/23  1:44 PM  Result Value Ref Range   Troponin I (High Sensitivity) 4,234 (HH) <18 ng/L    Comment: CRITICAL VALUE NOTED. VALUE IS CONSISTENT WITH PREVIOUSLY REPORTED/CALLED VALUE (NOTE) Elevated high sensitivity troponin I (hsTnI) values and significant  changes across serial measurements may suggest ACS but many other  chronic and acute conditions are known to elevate hsTnI results.  Refer to the "Links" section for chest pain algorithms and additional  guidance. Performed at [facility] Lab, [address] [place], [place] [address]   Troponin I (High Sensitivity)     Status: Abnormal   Collection Time: 04/14/23  5:01 PM  Result Value  Ref Range   Troponin I (High Sensitivity) 8,100 (HH) <18 ng/L    Comment: CRITICAL VALUE NOTED. VALUE IS CONSISTENT WITH PREVIOUSLY REPORTED/CALLED VALUE DELTA CHECK NOTED (NOTE) Elevated high sensitivity troponin I (hsTnI) values and significant  changes across serial measurements may suggest ACS but many other  chronic and acute conditions are known to elevate hsTnI results.  Refer to the "Links" section for chest pain algorithms and additional  guidance. Performed at [facility], [address] [place], [place] [address]   Magnesium     Status: None   Collection Time: 04/14/23  5:01 PM  Result Value Ref Range   Magnesium 2.2 1.7 - 2.4 mg/dL    Comment: Performed at [facility] Lab, [address] [place], [place] [address]  Troponin I (High Sensitivity)     Status: Abnormal   Collection Time: 04/14/23  9:11 PM  Result Value Ref Range   Troponin I (High Sensitivity) 8,447 (HH) <18 ng/L    Comment: CRITICAL VALUE NOTED. VALUE IS CONSISTENT WITH PREVIOUSLY REPORTED/CALLED VALUE (NOTE) Elevated  high sensitivity troponin I (hsTnI) values and significant  changes across serial measurements may suggest ACS but many other  chronic and acute conditions are known to elevate hsTnI results.  Refer to the "Links" section for chest pain algorithms and additional  guidance. Performed at [facility] Lab, [address] [place], [place] [address]   Heparin level (unfractionated)     Status: Abnormal   Collection Time: 04/15/23  1:01 AM  Result Value Ref Range   Heparin Unfractionated 0.11 (L) 0.30 - 0.70 IU/mL    Comment: (NOTE) The clinical reportable range upper limit is being lowered to >1.10 to align with the FDA approved guidance for the current laboratory assay.  If heparin results are below expected values, and patient dosage has  been confirmed, suggest follow up testing of antithrombin III levels. Performed at [facility], [address], [place],  [place] [address]   Lipid panel     Status: Abnormal   Collection Time: 04/15/23  5:00 AM  Result Value Ref Range   Cholesterol 170 0 - 200 mg/dL   Triglycerides 85 <098 mg/dL   HDL 45 >11 mg/dL   Total CHOL/HDL Ratio 3.8 RATIO   VLDL 17 0 - 40 mg/dL   LDL Cholesterol 914 (H) 0 - 99 mg/dL    Comment:        Total Cholesterol/HDL:CHD Risk Coronary Heart Disease Risk Table                     Men   Women  1/2 Average Risk   3.4   3.3  Average Risk       5.0   4.4  2 X Average Risk   9.6   7.1  3 X Average Risk  23.4   11.0        Use the calculated Patient Ratio above and the CHD Risk Table to determine the patient's CHD Risk.        ATP III CLASSIFICATION (LDL):  <100     mg/dL   Optimal  782-956  mg/dL   Near or Above                    Optimal  130-159  mg/dL   Borderline  213-086  mg/dL   High  >578     mg/dL   Very High Performed at [facility] Lab, [address], [place], [place] [address]   Hemoglobin A1c     Status: None   Collection Time: 04/15/23  5:00 AM  Result Value Ref Range   Hgb A1c MFr Bld 5.2 4.8 - 5.6 %    Comment: (NOTE) Pre diabetes:          5.7%-6.4%  Diabetes:              >6.4%  Glycemic control for   <7.0% adults with diabetes    Mean Plasma Glucose 102.54 mg/dL    Comment: Performed at [facility] Lab, [address] [place], [place] [address]  CBC     Status: Abnormal   Collection Time: 04/15/23  5:50 AM  Result Value Ref Range   WBC 5.6 4.0 - 10.5 K/uL   RBC 4.14 (L) 4.22 - 5.81 MIL/uL   Hemoglobin 12.7 (L) 13.0 - 17.0 g/dL   HCT 13.2 44.0 - 10.2 %   MCV 95.7 80.0 - 100.0 fL   MCH 30.7 26.0 - 34.0 pg   MCHC 32.1  30.0 - 36.0 g/dL   RDW 16.1 09.6 - 04.5 %   Platelets 78 (L) 150 - 400 K/uL    Comment: Immature Platelet Fraction may be clinically indicated, consider ordering this additional test [id number] REPEATED TO VERIFY    nRBC 0.0 0.0 - 0.2 %    Comment: Performed at [facility] Lab, [address], [place], [place]  [address]  Basic metabolic panel     Status: Abnormal   Collection Time: 04/15/23  5:50 AM  Result Value Ref Range   Sodium 135 135 - 145 mmol/L   Potassium 3.5 3.5 - 5.1 mmol/L   Chloride 96 (L) 98 - 111 mmol/L   CO2 25 22 - 32 mmol/L   Glucose, Bld 88 70 - 99 mg/dL    Comment: Glucose reference range applies only to samples taken after fasting for at least 8 hours.   BUN 43 (H) 6 - 20 mg/dL   Creatinine, Ser 5.62 (H) 0.61 - 1.24 mg/dL   Calcium 9.5 8.9 - 13.0 mg/dL   GFR, Estimated 9 (L) >60 mL/min    Comment: (NOTE) Calculated using the CKD-EPI Creatinine Equation (2021)    Anion gap 14 5 - 15    Comment: Performed at [facility] Lab, [address], [place], [place] [address]  Protime-INR     Status: None   Collection Time: 04/15/23  5:50 AM  Result Value Ref Range   Prothrombin Time 14.5 11.4 - 15.2 seconds   INR 1.1 0.8 - 1.2    Comment: (NOTE) INR goal varies based on device and disease states. Performed at [facility], [address], [place], [place] [address]   .  ROS:  see hpi   Physical Exam: Vitals:   04/15/23 0545 04/15/23 0930  BP:    Pulse: 93 89  Resp: 17 15  Temp: 97.8 F (36.6 C)   SpO2: 100% 100%     General: alert adult male, pleasant , NAD  HEENT: [place], at, Not icteric, MMM Neck: No jvd Heart: RRR, 3/6 sem , no rub , gallop Lungs: CTA, unlabored 100% Rm Air Abdomen: NABS  soft, Nt, ND   Extremities: No pedal edema  Skin: Warm dry , no overt rash , ulcer Neuro: OX3, appropriate , no acute focal deficits  Dialysis Access: + Bruit L FA AVF  Dialysis Orders: Center: [place]  . TTS EDW 62.1  ,4hr, 2 k, 2 ca Hep 3000 Mircera 100 last 03/08/23  Cacitriol 0.5 mcg po q tx      Assessment/Plan NSTEI, HO CAD =  plan per Card/ Admit , ? Cath today ESRD -  hd tts , hd today  no emergent need work  around  cath Hypertension/volume  - no excess vol , bp stable  home meds amlodipine, carvedilol, clonidine, hydralazine  Anemia  - hgb 12.7  no  esa needs  fo hgb trend  Metabolic bone disease -  Ca 9.5  , ad on phos ,   not on binder with meals  fu labs , VDRA on hd   Ho CVA  meds per admit ASA On Plavix as above/On Repatha outpatient   CPOD = inhaler meds  per admit / needs to dc smoking   Lenny Pastel, PA-C [facility]per [phone] 04/15/2023, 10:04 AM

## 2023-04-15 NOTE — Assessment & Plan Note (Signed)
-   On aspirin, Plavix, Repatha

## 2023-04-15 NOTE — Hospital Course (Signed)
[name] is a 53 y.o. male with medical history significant of hypertension, hyperlipidemia, CVA, seizure, CAD, ESRD on HD, GERD, CHF, anxiety, depression, abdominal aortic dissection s/p repair, COPD, R IJ thrombosis, prosthetic valve endocarditis, aortic stenosis, pericarditis presenting with chest pain. Pain developed during showering and radiated to left shoulder, neck, back. He was brought to the ER due to persistent pain. CT angio chest obtained which was negative for acute abnormalities.  Showed underlying stable postsurgical changes of repair of ascending aorta and endovascular stent graft repair of thoracic aorta. Troponins were elevated on admission and he was started on heparin drip and admitted for cardiology evaluation.

## 2023-04-16 ENCOUNTER — Encounter (HOSPITAL_COMMUNITY): Payer: 59 | Admitting: Cardiology

## 2023-04-16 DIAGNOSIS — I10 Essential (primary) hypertension: Secondary | ICD-10-CM | POA: Diagnosis not present

## 2023-04-16 DIAGNOSIS — I214 Non-ST elevation (NSTEMI) myocardial infarction: Secondary | ICD-10-CM | POA: Diagnosis not present

## 2023-04-16 DIAGNOSIS — Z7189 Other specified counseling: Secondary | ICD-10-CM

## 2023-04-16 DIAGNOSIS — Z992 Dependence on renal dialysis: Secondary | ICD-10-CM | POA: Diagnosis not present

## 2023-04-16 DIAGNOSIS — N186 End stage renal disease: Secondary | ICD-10-CM | POA: Diagnosis not present

## 2023-04-16 LAB — CBC
HCT: 33.8 % — ABNORMAL LOW (ref 39.0–52.0)
HCT: 34.7 % — ABNORMAL LOW (ref 39.0–52.0)
Hemoglobin: 11.1 g/dL — ABNORMAL LOW (ref 13.0–17.0)
Hemoglobin: 11.2 g/dL — ABNORMAL LOW (ref 13.0–17.0)
MCH: 30.1 pg (ref 26.0–34.0)
MCH: 30.5 pg (ref 26.0–34.0)
MCHC: 32.3 g/dL (ref 30.0–36.0)
MCHC: 32.8 g/dL (ref 30.0–36.0)
MCV: 92.9 fL (ref 80.0–100.0)
MCV: 93.3 fL (ref 80.0–100.0)
Platelets: 65 10*3/uL — ABNORMAL LOW (ref 150–400)
Platelets: 67 10*3/uL — ABNORMAL LOW (ref 150–400)
RBC: 3.64 MIL/uL — ABNORMAL LOW (ref 4.22–5.81)
RBC: 3.72 MIL/uL — ABNORMAL LOW (ref 4.22–5.81)
RDW: 13.9 % (ref 11.5–15.5)
RDW: 14.1 % (ref 11.5–15.5)
WBC: 5 10*3/uL (ref 4.0–10.5)
WBC: 5 10*3/uL (ref 4.0–10.5)
nRBC: 0 % (ref 0.0–0.2)
nRBC: 0 % (ref 0.0–0.2)

## 2023-04-16 LAB — RENAL FUNCTION PANEL
Albumin: 3.5 g/dL (ref 3.5–5.0)
Anion gap: 14 (ref 5–15)
BUN: 54 mg/dL — ABNORMAL HIGH (ref 6–20)
CO2: 23 mmol/L (ref 22–32)
Calcium: 9.4 mg/dL (ref 8.9–10.3)
Chloride: 96 mmol/L — ABNORMAL LOW (ref 98–111)
Creatinine, Ser: 8.34 mg/dL — ABNORMAL HIGH (ref 0.61–1.24)
GFR, Estimated: 7 mL/min — ABNORMAL LOW (ref >60)
Glucose, Bld: 76 mg/dL (ref 70–99)
Phosphorus: 5.7 mg/dL — ABNORMAL HIGH (ref 2.5–4.6)
Potassium: 3.7 mmol/L (ref 3.5–5.1)
Sodium: 133 mmol/L — ABNORMAL LOW (ref 135–145)

## 2023-04-16 LAB — HEPARIN LEVEL (UNFRACTIONATED)
Heparin Unfractionated: 0.14 [IU]/mL — ABNORMAL LOW (ref 0.30–0.70)
Heparin Unfractionated: 0.33 [IU]/mL (ref 0.30–0.70)

## 2023-04-16 MED ORDER — HEPARIN BOLUS VIA INFUSION
2000.0000 [IU] | Freq: Once | INTRAVENOUS | Status: AC
Start: 2023-04-16 — End: 2023-04-16
  Administered 2023-04-16: 2000 [IU] via INTRAVENOUS
  Filled 2023-04-16: qty 2000

## 2023-04-16 MED ORDER — HYDROMORPHONE HCL 1 MG/ML IJ SOLN
1.5000 mg | INTRAMUSCULAR | Status: DC | PRN
  Administered 2023-04-16 – 2023-04-17 (×3): 1.5 mg via INTRAVENOUS
  Filled 2023-04-16 (×4): qty 1.5

## 2023-04-16 NOTE — Assessment & Plan Note (Signed)
-   Patient informed by cardiology and myself regarding echo changes.  He has been told by cardiology that prognosis is poor and he needs to consider palliative/comfort care -He does seem at peace with understanding he is declining however he does have great difficulty with deciding how aggressive to remain especially in terms of his dialysis.  His main goal seems to be focusing on pain control however he is unsure if that is at the expense of him becoming too lethargic or missing dialysis sessions -I informed him he will meet with palliative care soon and conversations will be continued especially with how aggressive to be with pain control

## 2023-04-16 NOTE — Progress Notes (Signed)
Rounding Note    Patient Name: [name] Date of Encounter: 04/16/2023  [facility] Cardiologist: Marca Ancona, MD   Subjective   He is having significant chest pain.  He is scheduled IV pain medicine as needed.  We had a conversation today and I was very frank.  He is dying.  His echo was much worse.  He understands this and is at peace.  I did not discuss CODE STATUS directly but he has a palliative consult.  Recommend changing to DNR.   Inpatient Medications    Scheduled Meds:  amLODipine  10 mg Oral Daily   arformoterol  15 mcg Nebulization BID   And   umeclidinium bromide  1 puff Inhalation Daily   aspirin  81 mg Oral Daily   calcitRIOL  0.5 mcg Oral Q T,Th,Sa-HD   carvedilol  25 mg Oral BID WC   cephALEXin  1,000 mg Oral Daily   Chlorhexidine Gluconate Cloth  6 each Topical Q0600   Chlorhexidine Gluconate Cloth  6 each Topical Q0600   cloNIDine  0.2 mg Oral BID   clopidogrel  75 mg Oral Daily   hydrALAZINE  50 mg Oral TID   pantoprazole  40 mg Oral Daily   ranolazine  500 mg Oral BID   Continuous Infusions:  heparin 1,400 Units/hr (04/16/23 0406)   PRN Meds: acetaminophen, HYDROmorphone (DILAUDID) injection, ondansetron (ZOFRAN) IV, oxyCODONE-acetaminophen   Vital Signs    Vitals:   04/16/23 0811 04/16/23 0840 04/16/23 0900 04/16/23 0930  BP:  112/84 118/81 111/84  Pulse:  95 90   Resp:  16 17   Temp:  98.7 F (37.1 C)    TempSrc:  Oral    SpO2:  96% 97%   Weight: 65.8 kg     Height:        Intake/Output Summary (Last 24 hours) at 04/16/2023 1022 Last data filed at 04/16/2023 0424 Gross per 24 hour  Intake 950.48 ml  Output 400 ml  Net 550.48 ml      04/16/2023    8:11 AM 04/14/2023   11:00 PM 04/14/2023   10:00 PM  Last 3 Weights  Weight (lbs) 145 lb 1 oz 154 lb 5.2 oz 154 lb 5.2 oz  Weight (kg) 65.8 kg 70 kg 70 kg      Telemetry    Sinus rhythm- Personally Reviewed  ECG    Sinus rhythm LVH with repull- Personally  Reviewed  Physical Exam   Vitals:   04/16/23 0900 04/16/23 0930  BP: 118/81 111/84  Pulse: 90   Resp: 17   Temp:    SpO2: 97%     GEN: No acute distress.   Neck: No JVD Cardiac: tachycardic, +++Diastolic murmur Respiratory: Clear to auscultation bilaterally. GI: Soft, nontender, non-distended  MS: No edema; No deformity. Neuro:  Nonfocal  Psych: annoyed  Labs    High Sensitivity Troponin:   Recent Labs  Lab 04/14/23 1020 04/14/23 1344 04/14/23 1701 04/14/23 2111 04/15/23 1046  TROPONINIHS 400* 4,234* 8,100* 8,447* 13,651*     Chemistry Recent Labs  Lab 04/14/23 1020 04/14/23 1701 04/15/23 0550  NA 138  --  135  K 3.4*  --  3.5  CL 96*  --  96*  CO2 24  --  25  GLUCOSE 104*  --  88  BUN 41*  --  43*  CREATININE 6.62*  --  7.16*  CALCIUM 10.0  --  9.5  MG  --  2.2  --  GFRNONAA 9*  --  9*  ANIONGAP 18*  --  14    Lipids  Recent Labs  Lab 04/15/23 0500  CHOL 170  TRIG 85  HDL 45  LDLCALC 108*  CHOLHDL 3.8    Hematology Recent Labs  Lab 04/14/23 1020 04/15/23 0550 04/15/23 2359  WBC 6.1 5.6 5.0  RBC 4.44 4.14* 3.64*  HGB 13.6 12.7* 11.1*  HCT 42.9 39.6 33.8*  MCV 96.6 95.7 92.9  MCH 30.6 30.7 30.5  MCHC 31.7 32.1 32.8  RDW 14.2 14.2 13.9  PLT 89* 78* 67*   Thyroid No results for input(s): "TSH", "FREET4" in the last 168 hours.  BNPNo results for input(s): "BNP", "PROBNP" in the last 168 hours.  DDimer No results for input(s): "DDIMER" in the last 168 hours.   Radiology    ECHOCARDIOGRAM COMPLETE Result Date: 04/15/2023    ECHOCARDIOGRAM REPORT   Patient Name:   [name] Date of Exam: 04/15/2023 Medical Rec #:  [id number]     Height:       71.0 in Accession #:    [id number]    Weight:       154.3 lb Date of Birth:  04-29-1970      BSA:          1.889 m Patient Age:    52 years      BP:           97/64 mmHg Patient Gender: M             HR:           96 bpm. Exam Location:  Inpatient Procedure: 2D Echo, Color Doppler and Cardiac  Doppler Indications:    NSTEMI  History:        Patient has prior history of Echocardiogram examinations, most                 recent 02/15/2023. Cardiomyopathy, CAD, Bentall procedure, end                 stage renal disease and COPD; Risk Factors:Hypertension and                 Dyslipidemia.                 Aortic Valve: 27 mm Mitraflow valve is present in the aortic                 position.  Sonographer:    Delcie Roch RDCS Referring Phys: [id number] Cecille Po MELVIN IMPRESSIONS  1. Left ventricular ejection fraction, by estimation, is 60 to 65%. The left ventricle has normal function. The left ventricle has no regional wall motion abnormalities. There is moderate concentric left ventricular hypertrophy. Left ventricular diastolic parameters are consistent with Grade II diastolic dysfunction (pseudonormalization).  2. Right ventricular systolic function is normal. The right ventricular size is normal. There is normal pulmonary artery systolic pressure. The estimated right ventricular systolic pressure is 15.2 mmHg.  3. Left atrial size was severely dilated. There is right bowing of the interatrial septum, suggestive of elevated left atrial pressure  4. Right atrial size was severely dilated.  5. The mitral valve is normal in structure. Mild mitral valve regurgitation. No evidence of mitral stenosis.  6. The aortic valve has been repaired/replaced.     S/P Bentall procedure. There is a a 27mm Mitraflow bioprosthetic valve in the aortic position. 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  There is a large lucent space surrounding the valve with  flow into the space by colorflow doppler consistent with valve dehiscence. Severely dilated aortic root at 59mm. Mild aortic stenosis is present. Aortic valve mean gradient measures 14.0 mmHg. Aortic valve Vmax measures 2.44 m/s.  7. Aortic dilatation noted. 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  8. The  inferior vena cava is normal in size with greater than 50% respiratory variability, suggesting right atrial pressure of 3 mmHg.  9. Unable to compare to most recent echo of 02/2023 but compared to echo 12/2022, there appears to be significant extension of the AVR dehiscence with significant flow into what appears to be a pseudoaneurysm of the aortic root extending into the graft from the prior Bentall procedure. Findings were discussed with Dr. Carolan Clines. FINDINGS  Left Ventricle: Left ventricular ejection fraction, by estimation, is 60 to 65%. The left ventricle has normal function. The left ventricle has no regional wall motion abnormalities. The left ventricular internal cavity size was normal in size. There is  moderate concentric left ventricular hypertrophy. Left ventricular diastolic parameters are consistent with Grade II diastolic dysfunction (pseudonormalization). Normal left ventricular filling pressure. Right Ventricle: The right ventricular size is normal. No increase in right ventricular wall thickness. Right ventricular systolic function is normal. There is normal pulmonary artery systolic pressure. The tricuspid regurgitant velocity is 1.75 m/s, and  with an assumed right atrial pressure of 3 mmHg, the estimated right ventricular systolic pressure is 15.2 mmHg. Left Atrium: Left atrial size was severely dilated. Right Atrium: Right atrial size was severely dilated. Pericardium: There is no evidence of pericardial effusion. Mitral Valve: The mitral valve is normal in structure. Mild mitral annular calcification. Mild mitral valve regurgitation. No evidence of mitral valve stenosis. Tricuspid Valve: The tricuspid valve is normal in structure. Tricuspid valve regurgitation is trivial. No evidence of tricuspid stenosis. Aortic Valve: S/P Bentall procedure. There is a a 27mm Mitraflow bioprosthetic valve in the aortic position. 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 There is a large lucent space surrounding the valve with flow into the space by colorflow doppler consistent with valve dehiscence. Severely dilated aortic root at 59mm. The aortic valve has been repaired/replaced. Aortic valve regurgitation is not visualized. Mild aortic stenosis is present. Aortic  valve mean gradient measures 14.0 mmHg. Aortic valve peak gradient measures 23.8 mmHg. There is a 27 mm Mitraflow valve present in the aortic position. Pulmonic Valve: The pulmonic valve was normal in structure. Pulmonic valve regurgitation is trivial. No evidence of pulmonic stenosis. Aorta: Aortic dilatation noted. There is an aneurysm involving the aortic root measuring 59 mm. Venous: The inferior vena cava is normal in size with greater than 50% respiratory variability, suggesting right atrial pressure of 3 mmHg. IAS/Shunts: There is right bowing of the interatrial septum, suggestive of elevated left atrial pressure. No atrial level shunt detected by color flow Doppler.  LEFT VENTRICLE PLAX 2D LVIDd:         4.50 cm Diastology LVIDs:         3.10 cm LV e' medial:    6.99 cm/s LV PW:         1.40 cm LV E/e' medial:  11.5 LV IVS:        1.70 cm LV e' lateral:   12.90 cm/s  LV E/e' lateral: 6.2  RIGHT VENTRICLE            IVC RV Basal diam:  3.50 cm    IVC diam: 1.80 cm RV S prime:     9.17 cm/s TAPSE (M-mode): 2.3 cm LEFT ATRIUM              Index        RIGHT ATRIUM           Index LA diam:        4.00 cm  2.12 cm/m   RA Area:     28.10 cm LA Vol (A2C):   109.0 ml 57.72 ml/m  RA Volume:   105.00 ml 55.60 ml/m LA Vol (A4C):   80.7 ml  42.73 ml/m LA Biplane Vol: 95.9 ml  50.78 ml/m  AORTIC VALVE AV Vmax:           244.00 cm/s AV Vmean:          171.000 cm/s AV VTI:            0.499 m AV Peak Grad:      23.8 mmHg AV Mean Grad:      14.0 mmHg LVOT Vmax:         135.50 cm/s LVOT Vmean:        78.050 cm/s LVOT VTI:          0.272 m LVOT/AV VTI ratio: 0.55 MITRAL VALVE                TRICUSPID VALVE MV Area (PHT): 3.12 cm    TR Peak grad:   12.2 mmHg MV Decel Time: 243 msec    TR Vmax:        175.00 cm/s MV E velocity: 80.10 cm/s MV A velocity: 61.20 cm/s  SHUNTS MV E/A ratio:  1.31        Systemic VTI: 0.27 m Armanda Magic MD Electronically signed by Armanda Magic MD Signature Date/Time: 04/15/2023/4:02:33 PM    Final    CT Angio Chest/Abd/Pel for Dissection W and/or Wo Contrast Result Date: 04/14/2023 CLINICAL DATA:  Chest pain radiating to the back. Acute aortic syndrome suspected. EXAM: CT ANGIOGRAPHY CHEST, ABDOMEN AND PELVIS TECHNIQUE: Non-contrast CT of the chest was initially obtained. Multidetector CT imaging through the chest, abdomen and pelvis was performed using the standard protocol during bolus administration of intravenous contrast. Multiplanar reconstructed images and MIPs were obtained and reviewed to evaluate the vascular anatomy. RADIATION DOSE REDUCTION: This exam was performed according to the departmental dose-optimization program which includes automated exposure control, adjustment of the mA and/or kV according to patient size and/or use of iterative reconstruction technique. CONTRAST:  75mL OMNIPAQUE IOHEXOL 350 MG/ML SOLN COMPARISON:  CT dated 03/02/2023. FINDINGS: Evaluation of this exam is limited due to respiratory motion and streak artifact caused by patient's arms. Evaluation is also limited due to loss of intra-abdominal fat and cachexia. CTA CHEST FINDINGS Cardiovascular: Stable cardiomegaly. No pericardial effusion. Coronary vascular calcification. Mechanical aortic valve. Postsurgical changes of the repair of the ascending aorta and endovascular stent graft repair of the thoracic aorta similar to prior CT. The stent graft appears patent. No contrast noted outside of the confines of the stent graft. No periaortic fluid collection or hematoma. The origins of the great vessels of the aortic arch appear patent as visualized. The central pulmonary  arteries appear patent. Mediastinum/Nodes: No hilar or mediastinal adenopathy. The esophagus is grossly unremarkable. No mediastinal fluid collection. Lungs/Pleura: Trace left pleural effusion.  No focal consolidation, or pneumothorax. The central airways are patent. Musculoskeletal: No acute osseous pathology. Left anterior chest wall implantable cardiac device. Median sternotomy wires. Review of the MIP images confirms the above findings. CTA ABDOMEN AND PELVIS FINDINGS VASCULAR Aorta: Similar appearance of postsurgical changes of the upper abdominal aorta with irregularity of the wall. No aortic dissection. A 4 cm distal abdominal aortic aneurysm similar to prior CT. A 3.3 x 3.4 cm thrombosed aneurysm sac from the proximal abdominal aorta similar to prior CT. Celiac: Postsurgical changes adjacent to the celiac trunk. The celiac artery and its major branches appear patent. SMA: The SMA is patent. Renals: The renal arteries appear patent. IMA: The IMA is patent. Inflow: Moderate atherosclerotic calcification. 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 No dissection. The iliac arteries are patent. Veins: The IVC is unremarkable.  No portal venous gas. Review of the MIP images confirms the above findings. NON-VASCULAR No intra-abdominal free air.  Small free fluid in the pelvis. Hepatobiliary: Small cyst in the dome of the liver. No biliary duct dilatation. Small calcified gallstone. No pericholecystic fluid. Pancreas: The pancreas is unremarkable as visualized. Spleen: The spleen is unremarkable Adrenals/Urinary Tract: The adrenal glands are unremarkable. There is no hydronephrosis on either side. There is hypoenhancement of the left renal parenchyma suggestive of hypoperfusion. There is mild left renal parenchyma atrophy. There is no hydronephrosis on either side. The urinary bladder is grossly unremarkable. Stomach/Bowel: Evaluation of the bowel is  limited in the absence of oral contrast and due to factors above. No bowel obstruction. The appendix is unremarkable as visualized. Lymphatic: No obvious adenopathy. Reproductive: The prostate and seminal vesicles are grossly remarkable. Other: Loss of subcutaneous fat and cachexia. Musculoskeletal: Degenerative changes of the spine. No acute osseous pathology. Review of the MIP images confirms the above findings. IMPRESSION: 1. No acute intrathoracic, abdominal, or pelvic pathology. No aortic dissection. 2. Stable postsurgical changes of the repair of the ascending aorta and endovascular stent graft repair of the thoracic aorta. 3. A 4 cm distal abdominal aortic aneurysm similar to prior CT. Recommend follow-up CT or MR as appropriate in 12 months and referral to or continued care with vascular specialist. (Ref.: J Vasc Surg. 2018; 67:2-77 and J Am Coll Radiol 2013;10(10):789-794.) 4. 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 5. Cholelithiasis. 6. Hypoperfusion of the left kidney.  No hydronephrosis. Electronically Signed   By: Elgie Collard M.D.   On: 04/14/2023 15:42    Cardiac Studies   TTE 02/15/2023 1. Severe biventricular hypertrophy, reasonable to rule out amyloid. Left  ventricular ejection fraction, by estimation, is 55 to 60%. The left  ventricle has normal function. The left ventricle has no regional wall  motion abnormalities. There is severe  left ventricular hypertrophy. Left ventricular diastolic parameters are  consistent with Grade I diastolic dysfunction (impaired relaxation).   2. Right ventricular systolic function is mildly reduced. The right  ventricular size is normal. Moderately increased right ventricular wall  thickness.   3. Left atrial size was severely dilated.   4. Right atrial size was severely dilated.   5. The mitral valve is normal in structure. Mild mitral valve  regurgitation.   6. 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 There does not appear to be any significant paravalvular  leak or leaflet involvement. As  noted previously, significant prosthetic  valve degeneration and stenosis.  The aortic valve has been repaired/replaced. There is mild calcification  of the aortic valve. There is moderate thickening of the aortic valve.  Aortic valve regurgitation is not visualized. Echo findings are consistent  with stenosis of the aortic  prosthesis. Aortic valve area, by VTI measures 1.36 cm. Aortic valve mean  gradient measures 30.5 mmHg. Aortic valve Vmax measures 3.80 m/s.   7. Patient with previous aortic Bentall procedure in 2016. There appears  to be a large pseudoaneursym primarily anterior to the aortic valve best  seen adjacent to the right coronary cusp but extending circumferentially.  Aortic dilatation noted.   8. Moderately dilated pulmonary artery.   9. The inferior vena cava is normal in size with greater than 50%  respiratory variability, suggesting right atrial pressure of 3 mmHg.   Patient Profile     Mr. 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 Here with NSTEMI.   Assessment & Plan  NSTEMI Delta troponin [phone].  He presented with acute chest pain.  EF is normal.  LV hypertrophy likely in the setting of end-stage renal disease.  -Discussed his case with interventional and it was deemed that it would be too high risk to try an additional left heart catheterization.  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 He is not a candidate for redo surgery which was discussed at [facility] on 1/17 with Dr. Kizzie Bane -Will add ranolazine -He does not tolerate nitro -Agree with Percocet and can consider palliative care consult to help with pain management  Hypertension Good control continue to monitor -Will increase his carvedilol to 25 mg twice daily -Continue amlodipine 10 mg daily -Continue hydralazine 50 mg 3 times daily -On clonidine 0.2 mg twice daily  ESRD - IHD   The plan for him is comfort care.  Will leave the rest of palliative.  Cardiology will sign off.  Time Spent Directly with Patient:  I have spent a total of 35 minutes with the patient reviewing hospital notes, telemetry, EKGs, labs and examining the patient as well as establishing an assessment and plan that was discussed personally with the patient.     For questions or updates, please contact [facility] Please consult [email or web address] for contact info under        Signed, Maisie Fus, MD  04/16/2023, 10:22 AM

## 2023-04-16 NOTE — Progress Notes (Addendum)
[facility] Progress Note   Subjective: Seen prior to starting HD. Having 10/10 Chest pain. He seems fairly calm. Getting dilaudid. He is hemodynamically stable. Will start HD and see how things go. RN advised to call if he becomes unstable, no improvement in chest pain after meds  Objective Vitals:   04/15/23 2103 04/15/23 2251 04/16/23 0424 04/16/23 0811  BP:  94/69 107/70   Pulse: 78 79 84   Resp:  17 (!) 21   Temp:  98.2 F (36.8 C) 99.7 F (37.6 C)   TempSrc:  Oral Oral   SpO2: 96% 97% 94%   Weight:    65.8 kg  Height:       Physical Exam General: Chronically ill appearing male in NAD Heart: SR on monitor.. RRR 2/6 systolic M.  Lungs: few scattered upper airway rhonci, no WOB Abdomen:NABS, NT Extremities:No LE edema Dialysis Access: L AVF cannulated    Additional Objective Labs: Basic Metabolic Panel: Recent Labs  Lab 04/14/23 1020 04/15/23 0550 04/15/23 1046  NA 138 135  --   K 3.4* 3.5  --   CL 96* 96*  --   CO2 24 25  --   GLUCOSE 104* 88  --   BUN 41* 43*  --   CREATININE 6.62* 7.16*  --   CALCIUM 10.0 9.5  --   PHOS  --   --  5.5*   Liver Function Tests: No results for input(s): "AST", "ALT", "ALKPHOS", "BILITOT", "PROT", "ALBUMIN" in the last 168 hours. No results for input(s): "LIPASE", "AMYLASE" in the last 168 hours. CBC: Recent Labs  Lab 04/14/23 1020 04/15/23 0550 04/15/23 2359  WBC 6.1 5.6 5.0  HGB 13.6 12.7* 11.1*  HCT 42.9 39.6 33.8*  MCV 96.6 95.7 92.9  PLT 89* 78* 67*   Blood Culture    Component Value Date/Time   SDES BLOOD RIGHT ANTECUBITAL 03/02/2023 0051   SPECREQUEST  03/02/2023 0051    BOTTLES DRAWN AEROBIC AND ANAEROBIC Blood Culture adequate volume   CULT  03/02/2023 0051    NO GROWTH 5 DAYS Performed at [facility], [address] [place], [place] [address]    REPTSTATUS 03/07/2023 FINAL 03/02/2023 0051    Cardiac Enzymes: No results for input(s): "CKTOTAL", "CKMB", "CKMBINDEX",  "TROPONINI" in the last 168 hours. CBG: No results for input(s): "GLUCAP" in the last 168 hours. Iron Studies: No results for input(s): "IRON", "TIBC", "TRANSFERRIN", "FERRITIN" in the last 72 hours. @lablastinr3 @ Studies/Results: ECHOCARDIOGRAM COMPLETE Result Date: 04/15/2023    ECHOCARDIOGRAM REPORT   Patient Name:   SHAARAV RIPPLE Date of Exam: 04/15/2023 Medical Rec #:  [id number]     Height:       71.0 in Accession #:    [id number]    Weight:       154.3 lb Date of Birth:  08/03/1970      BSA:          1.889 m Patient Age:    52 years      BP:           97/64 mmHg Patient Gender: M             HR:           96 bpm. Exam Location:  Inpatient Procedure: 2D Echo, Color Doppler and Cardiac Doppler Indications:    NSTEMI  History:        Patient has prior history of Echocardiogram examinations, most  recent 02/15/2023. Cardiomyopathy, CAD, Bentall procedure, end                 stage renal disease and COPD; Risk Factors:Hypertension and                 Dyslipidemia.                 Aortic Valve: 27 mm Mitraflow valve is present in the aortic                 position.  Sonographer:    Delcie Roch RDCS Referring Phys: [id number] Cecille Po MELVIN IMPRESSIONS  1. Left ventricular ejection fraction, by estimation, is 60 to 65%. The left ventricle has normal function. The left ventricle has no regional wall motion abnormalities. There is moderate concentric left ventricular hypertrophy. Left ventricular diastolic parameters are consistent with Grade II diastolic dysfunction (pseudonormalization).  2. Right ventricular systolic function is normal. The right ventricular size is normal. There is normal pulmonary artery systolic pressure. The estimated right ventricular systolic pressure is 15.2 mmHg.  3. Left atrial size was severely dilated. There is right bowing of the interatrial septum, suggestive of elevated left atrial pressure  4. Right atrial size was severely dilated.  5. The mitral valve is  normal in structure. Mild mitral valve regurgitation. No evidence of mitral stenosis.  6. The aortic valve has been repaired/replaced.     S/P Bentall procedure. There is a a 27mm Mitraflow bioprosthetic valve in the aortic position. 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 There is a large lucent space surrounding the valve with  flow into the space by colorflow doppler consistent with valve dehiscence. Severely dilated aortic root at 59mm. Mild aortic stenosis is present. Aortic valve mean gradient measures 14.0 mmHg. Aortic valve Vmax measures 2.44 m/s.  7. Aortic dilatation noted. 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  8. The inferior vena cava is normal in size with greater than 50% respiratory variability, suggesting right atrial pressure of 3 mmHg.  9. Unable to compare to most recent echo of 02/2023 but compared to echo 12/2022, there appears to be significant extension of the AVR dehiscence with significant flow into what appears to be a pseudoaneurysm of the aortic root extending into the graft from the prior Bentall procedure. Findings were discussed with Dr. Carolan Clines. FINDINGS  Left Ventricle: Left ventricular ejection fraction, by estimation, is 60 to 65%. The left ventricle has normal function. The left ventricle has no regional wall motion abnormalities. The left ventricular internal cavity size was normal in size. There is  moderate concentric left ventricular hypertrophy. Left ventricular diastolic parameters are consistent with Grade II diastolic dysfunction (pseudonormalization). Normal left ventricular filling pressure. Right Ventricle: The right ventricular size is normal. No increase in right ventricular wall thickness. Right ventricular systolic function is normal. There is normal pulmonary artery systolic pressure. The tricuspid regurgitant velocity is 1.75 m/s, and  with an assumed right atrial pressure of 3 mmHg, the  estimated right ventricular systolic pressure is 15.2 mmHg. Left Atrium: Left atrial size was severely dilated. Right Atrium: Right atrial size was severely dilated. Pericardium: There is no evidence of pericardial effusion. Mitral Valve: The mitral valve is normal in structure. Mild mitral annular calcification. Mild mitral valve regurgitation. No evidence of mitral valve stenosis. Tricuspid Valve: The tricuspid valve is normal in structure. Tricuspid valve regurgitation is trivial. No evidence of tricuspid stenosis. Aortic Valve:  S/P Bentall procedure. There is a a 27mm Mitraflow bioprosthetic valve in the aortic position. 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 There is a large lucent space surrounding the valve with flow into the space by colorflow doppler consistent with valve dehiscence. Severely dilated aortic root at 59mm. The aortic valve has been repaired/replaced. Aortic valve regurgitation is not visualized. Mild aortic stenosis is present. Aortic  valve mean gradient measures 14.0 mmHg. Aortic valve peak gradient measures 23.8 mmHg. There is a 27 mm Mitraflow valve present in the aortic position. Pulmonic Valve: The pulmonic valve was normal in structure. Pulmonic valve regurgitation is trivial. No evidence of pulmonic stenosis. Aorta: Aortic dilatation noted. There is an aneurysm involving the aortic root measuring 59 mm. Venous: The inferior vena cava is normal in size with greater than 50% respiratory variability, suggesting right atrial pressure of 3 mmHg. IAS/Shunts: There is right bowing of the interatrial septum, suggestive of elevated left atrial pressure. No atrial level shunt detected by color flow Doppler.  LEFT VENTRICLE PLAX 2D LVIDd:         4.50 cm Diastology LVIDs:         3.10 cm LV e' medial:    6.99 cm/s LV PW:         1.40 cm LV E/e' medial:  11.5 LV IVS:        1.70 cm LV e' lateral:   12.90 cm/s                        LV  E/e' lateral: 6.2  RIGHT VENTRICLE            IVC RV Basal diam:  3.50 cm    IVC diam: 1.80 cm RV S prime:     9.17 cm/s TAPSE (M-mode): 2.3 cm LEFT ATRIUM              Index        RIGHT ATRIUM           Index LA diam:        4.00 cm  2.12 cm/m   RA Area:     28.10 cm LA Vol (A2C):   109.0 ml 57.72 ml/m  RA Volume:   105.00 ml 55.60 ml/m LA Vol (A4C):   80.7 ml  42.73 ml/m LA Biplane Vol: 95.9 ml  50.78 ml/m  AORTIC VALVE AV Vmax:           244.00 cm/s AV Vmean:          171.000 cm/s AV VTI:            0.499 m AV Peak Grad:      23.8 mmHg AV Mean Grad:      14.0 mmHg LVOT Vmax:         135.50 cm/s LVOT Vmean:        78.050 cm/s LVOT VTI:          0.272 m LVOT/AV VTI ratio: 0.55 MITRAL VALVE               TRICUSPID VALVE MV Area (PHT): 3.12 cm    TR Peak grad:   12.2 mmHg MV Decel Time: 243 msec    TR Vmax:        175.00 cm/s MV E velocity: 80.10 cm/s MV A velocity: 61.20 cm/s  SHUNTS MV E/A ratio:  1.31        Systemic VTI:  0.27 m Armanda Magic MD Electronically signed by Armanda Magic MD Signature Date/Time: 04/15/2023/4:02:33 PM    Final    CT Angio Chest/Abd/Pel for Dissection W and/or Wo Contrast Result Date: 04/14/2023 CLINICAL DATA:  Chest pain radiating to the back. Acute aortic syndrome suspected. EXAM: CT ANGIOGRAPHY CHEST, ABDOMEN AND PELVIS TECHNIQUE: Non-contrast CT of the chest was initially obtained. Multidetector CT imaging through the chest, abdomen and pelvis was performed using the standard protocol during bolus administration of intravenous contrast. Multiplanar reconstructed images and MIPs were obtained and reviewed to evaluate the vascular anatomy. RADIATION DOSE REDUCTION: This exam was performed according to the departmental dose-optimization program which includes automated exposure control, adjustment of the mA and/or kV according to patient size and/or use of iterative reconstruction technique. CONTRAST:  75mL OMNIPAQUE IOHEXOL 350 MG/ML SOLN COMPARISON:  CT dated 03/02/2023.  FINDINGS: Evaluation of this exam is limited due to respiratory motion and streak artifact caused by patient's arms. Evaluation is also limited due to loss of intra-abdominal fat and cachexia. CTA CHEST FINDINGS Cardiovascular: Stable cardiomegaly. No pericardial effusion. Coronary vascular calcification. Mechanical aortic valve. Postsurgical changes of the repair of the ascending aorta and endovascular stent graft repair of the thoracic aorta similar to prior CT. The stent graft appears patent. No contrast noted outside of the confines of the stent graft. No periaortic fluid collection or hematoma. The origins of the great vessels of the aortic arch appear patent as visualized. The central pulmonary arteries appear patent. Mediastinum/Nodes: No hilar or mediastinal adenopathy. The esophagus is grossly unremarkable. No mediastinal fluid collection. Lungs/Pleura: Trace left pleural effusion. No focal consolidation, or pneumothorax. The central airways are patent. Musculoskeletal: No acute osseous pathology. Left anterior chest wall implantable cardiac device. Median sternotomy wires. Review of the MIP images confirms the above findings. CTA ABDOMEN AND PELVIS FINDINGS VASCULAR Aorta: Similar appearance of postsurgical changes of the upper abdominal aorta with irregularity of the wall. No aortic dissection. A 4 cm distal abdominal aortic aneurysm similar to prior CT. A 3.3 x 3.4 cm thrombosed aneurysm sac from the proximal abdominal aorta similar to prior CT. Celiac: Postsurgical changes adjacent to the celiac trunk. The celiac artery and its major branches appear patent. SMA: The SMA is patent. Renals: The renal arteries appear patent. IMA: The IMA is patent. Inflow: Moderate atherosclerotic calcification. 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 No dissection. The iliac arteries are patent. Veins: The IVC is unremarkable.  No portal venous gas.  Review of the MIP images confirms the above findings. NON-VASCULAR No intra-abdominal free air.  Small free fluid in the pelvis. Hepatobiliary: Small cyst in the dome of the liver. No biliary duct dilatation. Small calcified gallstone. No pericholecystic fluid. Pancreas: The pancreas is unremarkable as visualized. Spleen: The spleen is unremarkable Adrenals/Urinary Tract: The adrenal glands are unremarkable. There is no hydronephrosis on either side. There is hypoenhancement of the left renal parenchyma suggestive of hypoperfusion. There is mild left renal parenchyma atrophy. There is no hydronephrosis on either side. The urinary bladder is grossly unremarkable. Stomach/Bowel: Evaluation of the bowel is limited in the absence of oral contrast and due to factors above. No bowel obstruction. The appendix is unremarkable as visualized. Lymphatic: No obvious adenopathy. Reproductive: The prostate and seminal vesicles are grossly remarkable. Other: Loss of subcutaneous fat and cachexia. Musculoskeletal: Degenerative changes of the spine. No acute osseous pathology. Review of the MIP images confirms the above findings. IMPRESSION:  1. No acute intrathoracic, abdominal, or pelvic pathology. No aortic dissection. 2. Stable postsurgical changes of the repair of the ascending aorta and endovascular stent graft repair of the thoracic aorta. 3. A 4 cm distal abdominal aortic aneurysm similar to prior CT. Recommend follow-up CT or MR as appropriate in 12 months and referral to or continued care with vascular specialist. (Ref.: J Vasc Surg. 2018; 67:2-77 and J Am Coll Radiol 2013;10(10):789-794.) 4. 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 5. Cholelithiasis. 6. Hypoperfusion of the left kidney.  No hydronephrosis. Electronically Signed   By: Elgie Collard M.D.   On: 04/14/2023 15:42   DG Chest 2 View Result Date: 04/14/2023 CLINICAL DATA:  Chest pain. EXAM:  CHEST - 2 VIEW COMPARISON:  03/02/2023. FINDINGS: Bilateral lung fields are clear. Bilateral costophrenic angles are clear. Stable cardio-mediastinal silhouette. Sternotomy wires, loop recorder device and vascular stents again noted. No acute osseous abnormalities. The soft tissues are within normal limits. IMPRESSION: No active cardiopulmonary disease. Electronically Signed   By: Jules Schick M.D.   On: 04/14/2023 11:40   Medications:  heparin 1,400 Units/hr (04/16/23 0406)    amLODipine  10 mg Oral Daily   arformoterol  15 mcg Nebulization BID   And   umeclidinium bromide  1 puff Inhalation Daily   aspirin  81 mg Oral Daily   calcitRIOL  0.5 mcg Oral Q T,Th,Sa-HD   carvedilol  25 mg Oral BID WC   cephALEXin  1,000 mg Oral Daily   Chlorhexidine Gluconate Cloth  6 each Topical Q0600   Chlorhexidine Gluconate Cloth  6 each Topical Q0600   cloNIDine  0.2 mg Oral BID   clopidogrel  75 mg Oral Daily   hydrALAZINE  50 mg Oral TID   pantoprazole  40 mg Oral Daily   ranolazine  500 mg Oral BID     Dialysis Orders: [facility]  TTS EDW 62.1  ,4hr, 2 k, 2 ca Hep 3000 units IV Mircera 100 mcg IV q 2 last 03/08/23  Cacitriol 0.5 mcg po q tx        Assessment/Plan NSTEMI, HO CAD - Per primary. Cardiology following H/O aortic dissection-S/P AVR chronic endocarditis-per primary ESRD -  T,Th,S HD today off schedule Hypertension/volume  - no excess vol , bp stable  home meds amlodipine, carvedilol, clonidine, hydralazine  Anemia  - hgb 12.7  no esa needs  fo hgb trend  Metabolic bone disease -  Ca 9.5  , ad on phos ,   not on binder with meals  fu labs , VDRA on hd   Ho CVA  meds per admit ASA On Plavix as above/On Repatha outpatient   COPD- inhaler meds  per admit / needs to dc smoking    Marne Meline H. Doree Kuehne NP-C 04/16/2023, 8:22 AM  [employer] [phone]

## 2023-04-16 NOTE — Progress Notes (Signed)
PHARMACY - ANTICOAGULATION CONSULT NOTE  Pharmacy Consult for heparin Indication: chest pain/ACS  Allergies  Allergen Reactions   Clonidine Derivatives Nausea And Vomiting and Other (See Comments)    Tablets by mouth = Tolerated  Patches on the skin = Nausea & vomiting after wearing 4-5 days    Imdur [Isosorbide Nitrate] Other (See Comments)    Headaches -- patient instructed to continue taking    Tramadol Other (See Comments)    Headaches    Clonidine Nausea And Vomiting    Patient Measurements: Height: 5\' 11"  (180.3 cm) Weight: 65.8 kg (145 lb 1 oz) IBW/kg (Calculated) : 75.3   Vital Signs: Temp: 98.7 F (37.1 C) (01/31 0840) Temp Source: Oral (01/31 0840) BP: 119/89 (01/31 1230) Pulse Rate: 76 (01/31 1230)  Labs: Recent Labs    04/14/23 1020 04/14/23 1344 04/14/23 1701 04/14/23 2111 04/15/23 0101 04/15/23 0550 04/15/23 1046 04/15/23 2359 04/16/23 0937 04/16/23 0938  HGB 13.6  --   --   --   --  12.7*  --  11.1* 11.2*  --   HCT 42.9  --   --   --   --  39.6  --  33.8* 34.7*  --   PLT 89*  --   --   --   --  78*  --  67* 65*  --   LABPROT  --   --   --   --   --  14.5  --   --   --   --   INR  --   --   --   --   --  1.1  --   --   --   --   HEPARINUNFRC  --   --   --   --    < >  --  0.20* 0.14*  --  0.33  CREATININE 6.62*  --   --   --   --  7.16*  --   --   --  8.34*  TROPONINIHS 400*   < > 8,100* 8,447*  --   --  13,651*  --   --   --    < > = values in this interval not displayed.    Estimated Creatinine Clearance: 9.9 mL/min (A) (by C-G formula based on SCr of 8.34 mg/dL (H)).   Medical History: Past Medical History:  Diagnosis Date   Acute bronchitis 01/09/2023   Acute on chronic combined systolic and diastolic CHF (congestive heart failure) (HCC) 12/08/2017   Acute renal failure superimposed on stage 3 chronic kidney disease (HCC) 01/26/2012   Adenomatous colon polyp 08/2015   Anxiety    Aortic disease (HCC)    Aortic dissection (HCC)     a. admx 04/2014 >> L renal infarct; a/c renal failure >> b.  s/p Bioprosthetic Bentall and total arch replacement and staged endovascular repair of descending aortic aneurysm ([facility] - Dr. Kizzie Bane)   CAD (coronary artery disease)    a. LHC 4/16:  oD1 60%   Cardiomyopathy (HCC)    a. non-ischemic - probably related to untreated HTN and ETOH abuse - Echo 3/13 with EF 35-40% >> b. Echo 4/16: Severe LVH, EF 55-60%, moderate AI, moderate MR, mild LAE, trivial effusion, known type B dissection with communication between true and false lumens with suprasternal images suggesting dissection plane may propagate to at least left subclavian takeoff, root above aortic valve okay     Cerebral infarction due to embolism of left middle cerebral artery (  HCC) 06/03/2016   Cerebrovascular accident (CVA) due to embolism of left middle cerebral artery (HCC)    Chronic abdominal pain    Chronic combined systolic and diastolic congestive heart failure (HCC)    a. 05/2011: Adm with pulm edema/HTN urgency, EF 35-40% with diffuse hypokinesis and moderate to severe mitral regurgitation. Cardiomyopathy likely due to uncontrolled HTN and ETOH abuse - cath deferred due to renal insufficiency (felt due to uncontrolled HTN). bElzie Rings MV 06/2011: EF 37% and no ischemia or infarction. c. EF 45-50% by echo 01/2012.   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 on dialysis 3-4 years total   ETOH abuse    a. Reported to have quit 05/2011.   Frequent headaches    GERD (gastroesophageal reflux disease)     Headache(784.0)    "q other day" (08/08/2013)   Hemorrhoid thrombosis    History of echocardiogram    Echo 1/17:  Severe LVH, EF 55-60%, no RWMA, Gr 2 DD, AVR ok, mild to mod MR, mild LAE, mild reduced RVSF, mod RAE   History of medication noncompliance    HYPERLIPIDEMIA    Hypertension    a. Hx of HTN urgency secondary to noncompliance. b. urinary metanephrine and catecholeamine levels normal 2013.  c. Renal art [name] 1/16:  No evidence of renal artery stenosis noted bilaterally.   INGUINAL HERNIA    Pneumonia ~ 2013   Sepsis (HCC) 01/18/2023   Serrated adenoma of colon 08/2015   SIRS (systemic inflammatory response syndrome) (HCC) 01/09/2023   Stroke (HCC)    Tobacco abuse    Valvular heart disease    a. Echo 05/2011: moderate to severe eccentric MR and mild to moderate AI with prolapsing left coronary cusp. b. Echo 01/2012: mild-mod AI, mild dilitation of aortic root, mild MR.;  c. Echo 1/16: Severe LVH consistent with hypertrophic cardio myopathy, EF 50%, no RWMA, mod AI, mild MR, mild RAE, dilated Ao root (40 mm);     Medications:  Medications Prior to Admission  Medication Sig Dispense Refill Last Dose/Taking   acetaminophen (TYLENOL) 500 MG tablet Take 1,000 mg by mouth every 6 (six) hours as needed for mild pain (pain score 1-3), moderate pain (pain score 4-6) or headache.   Taking As Needed   albuterol (VENTOLIN HFA) 108 (90 Base) MCG/ACT inhaler Inhale 1 puff into the lungs every 6 (six) hours as needed for wheezing or shortness of breath.   Past Week   amLODipine (NORVASC) 10 MG tablet TAKE 1 TABLET(10 MG) BY MOUTH DAILY 90 tablet 3 04/13/2023   aspirin EC 81 MG EC tablet Take 1 tablet (81 mg total) by mouth daily. 90 tablet 3 04/14/2023 Morning   carvedilol (COREG) 12.5 MG tablet Take 1 tablet (12.5 mg total) by mouth 2 (two) times daily. 180 tablet 3 04/14/2023 Morning   cephALEXin (KEFLEX) 500 MG capsule Take 2 capsules (1,000 mg total) by mouth daily. Daily at night time, start  from 12/9 after completion of IV course. 60 capsule 1 04/13/2023   cloNIDine (  CATAPRES) 0.2 MG tablet Take 0.2 mg by mouth 2 (two) times daily.   04/14/2023 Morning   Evolocumab (REPATHA SURECLICK) 140 MG/ML SOAJ Inject 140 mg into the skin every 14 (fourteen) days. 6 mL 3 Past Month   hydrALAZINE (APRESOLINE) 100 MG tablet Take 0.5 tablets (50 mg total) by mouth 3 (three) times daily.   04/14/2023 Morning   HYDROcodone-acetaminophen (NORCO/VICODIN) 5-325 MG tablet Take 1 tablet by mouth every 8 (eight) hours as needed.   04/14/2023   lidocaine 4 % Place 1 patch onto the skin daily. Apply to lower back   Past Month   lidocaine-prilocaine (EMLA) cream Apply 1 application. topically as needed (prior to port being accessed). Use on dialysis days (Tues, Thurs and Sat)   Taking As Needed   meclizine (ANTIVERT) 25 MG tablet Take 1 tablet (25 mg total) by mouth 3 (three) times daily as needed for dizziness. 30 tablet 0 Taking As Needed   nitroGLYCERIN (NITROSTAT) 0.4 MG SL tablet Place 1 tablet (0.4 mg total) under the tongue every 5 (five) minutes as needed for chest pain. 25 tablet 0 04/14/2023 Morning   oxyCODONE-acetaminophen (PERCOCET) 5-325 MG tablet Take 1 tablet by mouth every 4 (four) hours as needed for severe pain. 20 tablet 0 Taking As Needed   pantoprazole (PROTONIX) 40 MG tablet Take 1 tablet (40 mg total) by mouth daily. 30 tablet 0 Past Month   polyethylene glycol powder (GLYCOLAX/MIRALAX) 17 GM/SCOOP powder Take 17 g by mouth daily. (Patient taking differently: Take 17 g by mouth daily as needed for moderate constipation.) 238 g 0 Past Month   STIOLTO RESPIMAT 2.5-2.5 MCG/ACT AERS Inhale 2 puffs into the lungs at bedtime.   04/13/2023    Assessment: 53 yo male here w/ NSTEMI and pharmacy dosing heparin. Plans noted for medical management (48 hrs heparin) and he is noted on DAPT.  -heparin level= 0.33 on 1400 units/hr -Hg= 12.7, plt= 78 (hx low plt) -heparin started 1/29 ~ 4:30pm  Goal of  Therapy:  Heparin level 0.3-0.7 units/ml Monitor platelets by anticoagulation protocol: Yes   Plan:  -Continue heparin 1400 units/hr -Discussed with cardiology, discontinue heparin post HD   Harland German, PharmD Clinical Pharmacist **Pharmacist phone directory can now be found on [email or web address] ([email or web address]).  Listed under [facility].

## 2023-04-16 NOTE — Progress Notes (Signed)
Received patient in bed to unit.  Alert and oriented.  Informed consent signed and in chart.   TX duration:4  Patient tolerated well.  Transported back to the room  Alert, without acute distress.  Hand-off given to patient's nurse.   Access used: LAVF Access issues: none  Total UF removed: 3L Medication(s) given: dialudid   04/16/23 1246  Vitals  Temp (!) 97.5 F (36.4 C)  BP 125/82  MAP (mmHg) 97  Pulse Rate 79  ECG Heart Rate 73  Resp 13  Oxygen Therapy  SpO2 100 %  During Treatment Monitoring  Blood Flow Rate (mL/min) 399 mL/min  Arterial Pressure (mmHg) -187.06 mmHg  Venous Pressure (mmHg) 204.84 mmHg  TMP (mmHg) 17.57 mmHg  Ultrafiltration Rate (mL/min) 832 mL/min  Dialysate Flow Rate (mL/min) 300 ml/min  Dialysate Potassium Concentration 3  Dialysate Calcium Concentration 2.5  Duration of HD Treatment -hour(s) 4 hour(s)  Cumulative Fluid Removed (mL) per Treatment  2997.46  HD Safety Checks Performed Yes  Intra-Hemodialysis Comments Tx completed  Dialysis Fluid Bolus Normal Saline  Bolus Amount (mL) 300 mL  Post Treatment  Dialyzer Clearance Lightly streaked  Liters Processed 96  Fluid Removed (mL) 3000 mL  Tolerated HD Treatment Yes  AVG/AVF Arterial Site Held (minutes) 5 minutes  AVG/AVF Venous Site Held (minutes) 5 minutes  Fistula / Graft Left Upper arm Arteriovenous fistula  Placement Date/Time: 09/09/20 1117   Orientation: Left  Access Location: (c) Upper arm  Access Type: Arteriovenous fistula  Site Condition No complications  Fistula / Graft Assessment Present;Thrill;Bruit  Status Deaccessed;Flushed;Patent      Freddi Starr, RN [facility]

## 2023-04-16 NOTE — Progress Notes (Signed)
Progress Note    [name]   [id number]  DOB: June 08, 1970  DOA: 04/14/2023     1 PCP: Loura Back, NP  Initial CC: CP  Hospital Course: [name] is a 53 y.o. male with medical history significant of hypertension, hyperlipidemia, CVA, seizure, CAD, ESRD on HD, GERD, CHF, anxiety, depression, abdominal aortic dissection s/p repair, COPD, R IJ thrombosis, prosthetic valve endocarditis, aortic stenosis, pericarditis presenting with chest pain. Pain developed during showering and radiated to left shoulder, neck, back. He was brought to the ER due to persistent pain. CT angio chest obtained which was negative for acute abnormalities.  Showed underlying stable postsurgical changes of repair of ascending aorta and endovascular stent graft repair of thoracic aorta. Troponins were elevated on admission and he was started on heparin drip and admitted for cardiology evaluation.  Interval History:  Still having ongoing chest pains today.  Asking for Dilaudid dose increase. He understands poor prognosis after echo findings yesterday.  Palliative care has been consulted.  He is going to continue thinking about plan and what his goals are especially regarding dialysis going forward.   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  He has been told by cardiology that prognosis is poor and he needs to consider palliative/comfort care -He does seem at peace with understanding he is declining however he does have great difficulty with deciding how aggressive to remain especially in terms of his dialysis.  His main goal seems to be focusing on pain control however he is unsure if that is at the expense of him becoming too lethargic or missing dialysis sessions -I informed him he will meet with palliative care soon and conversations will be continued especially with how aggressive to be with pain control  ESRD on dialysis (HCC) - on TTSa HD - nephrology consulted on admission; HD per nephro  Coronary artery disease involving native coronary artery of native heart with angina pectoris (HCC) - continue DAPT - ranexa added per cardiology; patient noted to not tolerate nitro  History of aortic dissection - Patient has known history of aortic valve stenosis status post bioprosthetic aortic valve replacement in 2016.  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 - Echo last month showed degeneration of the prosthetic aortic valve and large pseudoaneurysm with aortic stenosis. 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  COPD (chronic obstructive pulmonary disease) (HCC) - Continue breathing treatments - No S/S exacerbation  History of stroke - On aspirin, Plavix, Repatha  H/O  aortic valve replacement - See above  Essential hypertension - Continue  antihypertensives  S/P aortic dissection repair - See above  GERD without esophagitis - Continue PPI  Hyperlipidemia LDL goal <70 - On Repatha outpatient   Old records reviewed in assessment of this patient  Antimicrobials:   DVT prophylaxis:  Heparin drip   Code Status:   Code Status: Full Code  Mobility Assessment (Last 72 Hours)     Mobility Assessment     Row Name 04/15/23 2004 04/15/23 1216         Does patient have an order for bedrest or is patient medically unstable No - Continue assessment No - Continue assessment      What is the highest level of mobility based on the progressive mobility assessment? Level 5 (Walks with assist in room/hall) - Balance while stepping forward/back and can walk in room with assist - Complete Level 5 (Walks with assist in room/hall) - Balance while stepping forward/back and can walk in room with assist - Complete               Barriers to discharge: none Disposition Plan:  Home Status is: Inpt  Objective: Blood pressure 118/86, pulse 79, temperature (!) 97.5 F (36.4 C), resp. rate 13, height 5\' 11"  (1.803 m), weight 65.8 kg, SpO2 100%.  Examination:  Physical Exam Constitutional:      General: He is not in acute distress.    Appearance: Normal appearance.  HENT:     Head: Normocephalic and atraumatic.     Mouth/Throat:     Mouth: Mucous membranes are moist.  Eyes:     Extraocular Movements: Extraocular movements intact.  Cardiovascular:     Rate and Rhythm: Normal rate and regular rhythm.     Heart sounds: Murmur heard.  Pulmonary:     Effort: Pulmonary effort is normal. No respiratory distress.     Breath sounds: Normal breath sounds. No wheezing.  Abdominal:     General: Bowel sounds are normal. There is no distension.     Palpations: Abdomen is soft.     Tenderness: There is no abdominal tenderness.  Musculoskeletal:        General: Normal range of motion.     Cervical back: Normal range of motion and  neck supple.  Skin:    General: Skin is warm and dry.  Neurological:     General: No focal deficit present.     Mental Status: He is alert.  Psychiatric:        Mood and Affect: Mood normal.        Behavior: Behavior normal.      Consultants:  Cardiology Palliative care  Procedures:    Data Reviewed: Results for orders placed or performed during the hospital encounter of 04/14/23 (from the past 24 hours)  Heparin level (unfractionated)     Status: Abnormal   Collection Time: 04/15/23 11:59 PM  Result Value Ref Range   Heparin Unfractionated 0.14 (L) 0.30 - 0.70 IU/mL  CBC     Status: Abnormal   Collection Time: 04/15/23 11:59 PM  Result Value Ref Range   WBC 5.0 4.0 - 10.5 K/uL   RBC 3.64 (L) 4.22 - 5.81 MIL/uL   Hemoglobin 11.1 (L) 13.0 - 17.0 g/dL   HCT 08.6 (L) 57.8 - 46.9 %   MCV 92.9 80.0 - 100.0 fL   MCH 30.5 26.0 - 34.0 pg   MCHC 32.8 30.0 - 36.0 g/dL   RDW 62.9 52.8 -  15.5 %   Platelets 67 (L) 150 - 400 K/uL   nRBC 0.0 0.0 - 0.2 %  CBC     Status: Abnormal   Collection Time: 04/16/23  9:37 AM  Result Value Ref Range   WBC 5.0 4.0 - 10.5 K/uL   RBC 3.72 (L) 4.22 - 5.81 MIL/uL   Hemoglobin 11.2 (L) 13.0 - 17.0 g/dL   HCT 40.9 (L) 81.1 - 91.4 %   MCV 93.3 80.0 - 100.0 fL   MCH 30.1 26.0 - 34.0 pg   MCHC 32.3 30.0 - 36.0 g/dL   RDW 78.2 95.6 - 21.3 %   Platelets 65 (L) 150 - 400 K/uL   nRBC 0.0 0.0 - 0.2 %  Heparin level (unfractionated)     Status: None   Collection Time: 04/16/23  9:38 AM  Result Value Ref Range   Heparin Unfractionated 0.33 0.30 - 0.70 IU/mL  Renal function panel     Status: Abnormal   Collection Time: 04/16/23  9:38 AM  Result Value Ref Range   Sodium 133 (L) 135 - 145 mmol/L   Potassium 3.7 3.5 - 5.1 mmol/L   Chloride 96 (L) 98 - 111 mmol/L   CO2 23 22 - 32 mmol/L   Glucose, Bld 76 70 - 99 mg/dL   BUN 54 (H) 6 - 20 mg/dL   Creatinine, Ser 0.86 (H) 0.61 - 1.24 mg/dL   Calcium 9.4 8.9 - 57.8 mg/dL   Phosphorus 5.7 (H) 2.5 - 4.6  mg/dL   Albumin 3.5 3.5 - 5.0 g/dL   GFR, Estimated 7 (L) >60 mL/min   Anion gap 14 5 - 15    I have reviewed pertinent nursing notes, vitals, labs, and images as necessary. I have ordered labwork to follow up on as indicated.  I have reviewed the last notes from staff over past 24 hours. I have discussed patient's care plan and test results with nursing staff, CM/SW, and other staff as appropriate.  Time spent: Greater than 50% of the 55 minute visit was spent in counseling/coordination of care for the patient as laid out in the A&P.   LOS: 1 day   Lewie Chamber, MD [facility] 04/16/2023, 1:57 PM

## 2023-04-16 NOTE — Progress Notes (Signed)
PHARMACY - ANTICOAGULATION CONSULT NOTE  Pharmacy Consult for heparin Indication:  NSTEMI  Labs: Recent Labs    04/14/23 1020 04/14/23 1344 04/14/23 1701 04/14/23 2111 04/15/23 0101 04/15/23 0550 04/15/23 1046 04/15/23 2359  HGB 13.6  --   --   --   --  12.7*  --  11.1*  HCT 42.9  --   --   --   --  39.6  --  33.8*  PLT 89*  --   --   --   --  78*  --  67*  LABPROT  --   --   --   --   --  14.5  --   --   INR  --   --   --   --   --  1.1  --   --   HEPARINUNFRC  --   --   --   --  0.11*  --  0.20* 0.14*  CREATININE 6.62*  --   --   --   --  7.16*  --   --   TROPONINIHS 400*   < > 8,100* 8,447*  --   --  13,651*  --    < > = values in this interval not displayed.    Assessment: 53yo male subtherapeutic on heparin with lower heparin level despite increased rate (though rate was ordered at 1150 units/hr instead of planned 1250/h); no infusion issues or signs of bleeding per RN.  Goal of Therapy:  Heparin level 0.3-0.7 units/ml   Plan:  2000 units heparin bolus. Increase heparin infusion by 3 units/kg/hr to 1400 units/hr. Check level in 8 hours.   Vernard Gambles, PharmD, BCPS 04/16/2023 12:46 AM

## 2023-04-17 DIAGNOSIS — Z515 Encounter for palliative care: Secondary | ICD-10-CM

## 2023-04-17 DIAGNOSIS — Z992 Dependence on renal dialysis: Secondary | ICD-10-CM | POA: Diagnosis not present

## 2023-04-17 DIAGNOSIS — I214 Non-ST elevation (NSTEMI) myocardial infarction: Secondary | ICD-10-CM | POA: Diagnosis not present

## 2023-04-17 DIAGNOSIS — Z7189 Other specified counseling: Secondary | ICD-10-CM | POA: Diagnosis not present

## 2023-04-17 DIAGNOSIS — I729 Aneurysm of unspecified site: Secondary | ICD-10-CM | POA: Diagnosis not present

## 2023-04-17 DIAGNOSIS — N186 End stage renal disease: Secondary | ICD-10-CM | POA: Diagnosis not present

## 2023-04-17 LAB — HEPATITIS B SURFACE ANTIBODY, QUANTITATIVE: Hep B S AB Quant (Post): <3.5 m[IU]/mL — ABNORMAL LOW

## 2023-04-17 MED ORDER — HYDROMORPHONE HCL 1 MG/ML IJ SOLN
1.5000 mg | INTRAMUSCULAR | Status: DC | PRN
  Administered 2023-04-17 – 2023-04-18 (×3): 1.5 mg via INTRAVENOUS
  Filled 2023-04-17 (×3): qty 1.5

## 2023-04-17 MED ORDER — HYDROMORPHONE HCL 2 MG PO TABS
4.0000 mg | ORAL_TABLET | ORAL | Status: DC | PRN
  Administered 2023-04-17 – 2023-04-18 (×3): 4 mg via ORAL
  Filled 2023-04-17 (×3): qty 2

## 2023-04-17 NOTE — Progress Notes (Signed)
Progress Note    [name]   [id number]  DOB: Jun 20, 1970  DOA: 04/14/2023     2 PCP: Loura Back, NP  Initial CC: CP  Hospital Course: [name] is a 53 y.o. male with medical history significant of hypertension, hyperlipidemia, CVA, seizure, CAD, ESRD on HD, GERD, CHF, anxiety, depression, abdominal aortic dissection s/p repair, COPD, R IJ thrombosis, prosthetic valve endocarditis, aortic stenosis, pericarditis presenting with chest pain. Pain developed during showering and radiated to left shoulder, neck, back. He was brought to the ER due to persistent pain. CT angio chest obtained which was negative for acute abnormalities.  Showed underlying stable postsurgical changes of repair of ascending aorta and endovascular stent graft repair of thoracic aorta. Troponins were elevated on admission and he was started on heparin drip and admitted for cardiology evaluation.  Interval History:  Still having ongoing chest pain this morning but seems to have more relief with increase of Dilaudid dose.  Wife present bedside and updated.  Reviewed echo findings again with both of them and recommendations per cardiology for considering a comfort care route. He also met with palliative care later this afternoon. We are working on transitioning to oral pain regimen to help get patient home soon.   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  He has been told by cardiology that prognosis is poor and he needs to consider palliative/comfort care -He does seem at peace with understanding he is declining however he does have great difficulty with deciding how aggressive to remain especially in terms of his dialysis.  His main goal seems to be focusing on pain control however he is unsure if that is at the expense of him becoming too lethargic or missing dialysis sessions -Discussed with palliative care, greatly appreciate assistance.  Ongoing conversations and patient now discussing further decisions with his wife; next goal is to transition him to an oral pain regimen to help facilitate discharging home  ESRD on dialysis (HCC) - on TTSa HD - nephrology consulted on admission; HD per nephro  Coronary artery disease involving native coronary artery of native heart with angina pectoris (HCC) - continue DAPT - ranexa added per cardiology; patient noted to not tolerate nitro  History of aortic dissection - Patient has known history of aortic valve stenosis status post bioprosthetic aortic valve replacement in 2016.  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 - Echo last month showed degeneration of the prosthetic aortic valve and large pseudoaneurysm with aortic stenosis. -Repeat echo on admission shows valve dehiscence with severely dilated aortic root, 59 mm  Prosthetic valve endocarditis (HCC) -  On chronic suppressive treatment with Keflex  COPD (chronic obstructive pulmonary  disease) (HCC) - Continue breathing treatments - No S/S exacerbation  History of stroke - On aspirin, Plavix, Repatha  H/O aortic valve replacement - See above  Essential hypertension - Continue antihypertensives  S/P aortic dissection repair - See above  GERD without esophagitis - Continue PPI  Hyperlipidemia LDL goal <70 - On Repatha outpatient   Old records reviewed in assessment of this patient  Antimicrobials:   DVT prophylaxis:  s/p heparin drip   Code Status:   Code Status: Full Code  Mobility Assessment (Last 72 Hours)     Mobility Assessment     Row Name 04/17/23 0730 04/16/23 2020 04/16/23 1335 04/15/23 2004 04/15/23 1216   Does patient have an order for bedrest or is patient medically unstable No - Continue assessment No - Continue assessment No - Continue assessment No - Continue assessment No - Continue assessment   What is the highest level of mobility based on the progressive mobility assessment? Level 6 (Walks independently in room and hall) - Balance while walking in room without assist - Complete Level 6 (Walks independently in room and hall) - Balance while walking in room without assist - Complete Level 5 (Walks with assist in room/hall) - Balance while stepping forward/back and can walk in room with assist - Complete Level 5 (Walks with assist in room/hall) - Balance while stepping forward/back and can walk in room with assist - Complete Level 5 (Walks with assist in room/hall) - Balance while stepping forward/back and can walk in room with assist - Complete            Barriers to discharge: none Disposition Plan:  Home Status is: Inpt  Objective: Blood pressure 103/75, pulse 84, temperature 98 F (36.7 C), temperature source Oral, resp. rate 19, height 5\' 11"  (1.803 m), weight 65.8 kg, SpO2 95%.  Examination:  Physical Exam Constitutional:      General: He is not in acute distress.    Appearance: Normal appearance.  HENT:     Head:  Normocephalic and atraumatic.     Mouth/Throat:     Mouth: Mucous membranes are moist.  Eyes:     Extraocular Movements: Extraocular movements intact.  Cardiovascular:     Rate and Rhythm: Normal rate and regular rhythm.     Heart sounds: Murmur heard.  Pulmonary:     Effort: Pulmonary effort is normal. No respiratory distress.     Breath sounds: Normal breath sounds. No wheezing.  Abdominal:     General: Bowel sounds are normal. There is no distension.     Palpations: Abdomen is soft.     Tenderness: There is no abdominal tenderness.  Musculoskeletal:        General: Normal range of motion.     Cervical back: Normal range of motion and neck supple.  Skin:    General: Skin is warm and dry.  Neurological:     General: No focal deficit present.     Mental Status: He is alert.  Psychiatric:        Mood and Affect: Mood normal.        Behavior: Behavior normal.      Consultants:  Cardiology Palliative care  Procedures:    Data Reviewed: No results found for this or any previous visit (from the past 24 hours).   I have reviewed pertinent nursing notes, vitals, labs, and images as necessary. I have ordered labwork to follow up on  as indicated.  I have reviewed the last notes from staff over past 24 hours. I have discussed patient's care plan and test results with nursing staff, CM/SW, and other staff as appropriate.  Time spent: Greater than 50% of the 55 minute visit was spent in counseling/coordination of care for the patient as laid out in the A&P.   LOS: 2 days   Lewie Chamber, MD [facility] 04/17/2023, 3:29 PM

## 2023-04-17 NOTE — Progress Notes (Signed)
[name] Progress Note   Subjective: Patient states he is feeling a little bit better today.  Continues to have some chest pain throughout the day.  Tolerated dialysis with 3 L removed yesterday  Objective Vitals:   04/16/23 1734 04/16/23 1947 04/17/23 0017 04/17/23 0341  BP: 117/77 110/66 95/70 103/75  Pulse: 94   84  Resp:  17 14 19   Temp:  97.7 F (36.5 C) 97.7 F (36.5 C) 98.6 F (37 C)  TempSrc:  Oral Oral Oral  SpO2:   94% 95%  Weight:      Height:       Physical Exam General: Lying in no apparent distress Heart: Normal rate, no audible rub Lungs: Lateral chest rise with no increased work of breathing Abdomen:NABS, NT Extremities:No LE edema, warm and well-perfused Dialysis Access: L AVF     Additional Objective Labs: Basic Metabolic Panel: Recent Labs  Lab 04/14/23 1020 04/15/23 0550 04/15/23 1046 04/16/23 0938  NA 138 135  --  133*  K 3.4* 3.5  --  3.7  CL 96* 96*  --  96*  CO2 24 25  --  23  GLUCOSE 104* 88  --  76  BUN 41* 43*  --  54*  CREATININE 6.62* 7.16*  --  8.34*  CALCIUM 10.0 9.5  --  9.4  PHOS  --   --  5.5* 5.7*   Liver Function Tests: Recent Labs  Lab 04/16/23 0938  ALBUMIN 3.5   No results for input(s): "LIPASE", "AMYLASE" in the last 168 hours. CBC: Recent Labs  Lab 04/14/23 1020 04/15/23 0550 04/15/23 2359 04/16/23 0937  WBC 6.1 5.6 5.0 5.0  HGB 13.6 12.7* 11.1* 11.2*  HCT 42.9 39.6 33.8* 34.7*  MCV 96.6 95.7 92.9 93.3  PLT 89* 78* 67* 65*   Blood Culture    Component Value Date/Time   SDES BLOOD RIGHT ANTECUBITAL 03/02/2023 0051   SPECREQUEST  03/02/2023 0051    BOTTLES DRAWN AEROBIC AND ANAEROBIC Blood Culture adequate volume   CULT  03/02/2023 0051    NO GROWTH 5 DAYS Performed at [facility], [address] [place], [place] [address]    REPTSTATUS 03/07/2023 FINAL 03/02/2023 0051    Cardiac Enzymes: No results for input(s): "CKTOTAL", "CKMB", "CKMBINDEX", "TROPONINI" in the last  168 hours. CBG: No results for input(s): "GLUCAP" in the last 168 hours. Iron Studies: No results for input(s): "IRON", "TIBC", "TRANSFERRIN", "FERRITIN" in the last 72 hours. @lablastinr3 @ Studies/Results: ECHOCARDIOGRAM COMPLETE Result Date: 04/15/2023    ECHOCARDIOGRAM REPORT   Patient Name:   [name] Date of Exam: 04/15/2023 Medical Rec #:  [id number]     Height:       71.0 in Accession #:    [id number]    Weight:       154.3 lb Date of Birth:  1970-06-20      BSA:          1.889 m Patient Age:    52 years      BP:           97/64 mmHg Patient Gender: M             HR:           96 bpm. Exam Location:  Inpatient Procedure: 2D Echo, Color Doppler and Cardiac Doppler Indications:    NSTEMI  History:        Patient has prior history of Echocardiogram examinations, most  recent 02/15/2023. Cardiomyopathy, CAD, Bentall procedure, end                 stage renal disease and COPD; Risk Factors:Hypertension and                 Dyslipidemia.                 Aortic Valve: 27 mm Mitraflow valve is present in the aortic                 position.  Sonographer:    Delcie Roch RDCS Referring Phys: [id number] Cecille Po MELVIN IMPRESSIONS  1. Left ventricular ejection fraction, by estimation, is 60 to 65%. The left ventricle has normal function. The left ventricle has no regional wall motion abnormalities. There is moderate concentric left ventricular hypertrophy. Left ventricular diastolic parameters are consistent with Grade II diastolic dysfunction (pseudonormalization).  2. Right ventricular systolic function is normal. The right ventricular size is normal. There is normal pulmonary artery systolic pressure. The estimated right ventricular systolic pressure is 15.2 mmHg.  3. Left atrial size was severely dilated. There is right bowing of the interatrial septum, suggestive of elevated left atrial pressure  4. Right atrial size was severely dilated.  5. The mitral valve is normal in structure. Mild  mitral valve regurgitation. No evidence of mitral stenosis.  6. The aortic valve has been repaired/replaced.     S/P Bentall procedure. There is a a 27mm Mitraflow bioprosthetic valve in the aortic position. 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 There is a large lucent space surrounding the valve with  flow into the space by colorflow doppler consistent with valve dehiscence. Severely dilated aortic root at 59mm. Mild aortic stenosis is present. Aortic valve mean gradient measures 14.0 mmHg. Aortic valve Vmax measures 2.44 m/s.  7. Aortic dilatation noted. 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  8. The inferior vena cava is normal in size with greater than 50% respiratory variability, suggesting right atrial pressure of 3 mmHg.  9. Unable to compare to most recent echo of 02/2023 but compared to echo 12/2022, there appears to be significant extension of the AVR dehiscence with significant flow into what appears to be a pseudoaneurysm of the aortic root extending into the graft from the prior Bentall procedure. Findings were discussed with Dr. Carolan Clines. FINDINGS  Left Ventricle: Left ventricular ejection fraction, by estimation, is 60 to 65%. The left ventricle has normal function. The left ventricle has no regional wall motion abnormalities. The left ventricular internal cavity size was normal in size. There is  moderate concentric left ventricular hypertrophy. Left ventricular diastolic parameters are consistent with Grade II diastolic dysfunction (pseudonormalization). Normal left ventricular filling pressure. Right Ventricle: The right ventricular size is normal. No increase in right ventricular wall thickness. Right ventricular systolic function is normal. There is normal pulmonary artery systolic pressure. The tricuspid regurgitant velocity is 1.75 m/s, and  with an assumed right atrial pressure of 3 mmHg, the estimated right ventricular  systolic pressure is 15.2 mmHg. Left Atrium: Left atrial size was severely dilated. Right Atrium: Right atrial size was severely dilated. Pericardium: There is no evidence of pericardial effusion. Mitral Valve: The mitral valve is normal in structure. Mild mitral annular calcification. Mild mitral valve regurgitation. No evidence of mitral valve stenosis. Tricuspid Valve: The tricuspid valve is normal in structure. Tricuspid valve regurgitation is trivial. No evidence of tricuspid stenosis. Aortic Valve:  S/P Bentall procedure. There is a a 27mm Mitraflow bioprosthetic valve in the aortic position. 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 There is a large lucent space surrounding the valve with flow into the space by colorflow doppler consistent with valve dehiscence. Severely dilated aortic root at 59mm. The aortic valve has been repaired/replaced. Aortic valve regurgitation is not visualized. Mild aortic stenosis is present. Aortic  valve mean gradient measures 14.0 mmHg. Aortic valve peak gradient measures 23.8 mmHg. There is a 27 mm Mitraflow valve present in the aortic position. Pulmonic Valve: The pulmonic valve was normal in structure. Pulmonic valve regurgitation is trivial. No evidence of pulmonic stenosis. Aorta: Aortic dilatation noted. There is an aneurysm involving the aortic root measuring 59 mm. Venous: The inferior vena cava is normal in size with greater than 50% respiratory variability, suggesting right atrial pressure of 3 mmHg. IAS/Shunts: There is right bowing of the interatrial septum, suggestive of elevated left atrial pressure. No atrial level shunt detected by color flow Doppler.  LEFT VENTRICLE PLAX 2D LVIDd:         4.50 cm Diastology LVIDs:         3.10 cm LV e' medial:    6.99 cm/s LV PW:         1.40 cm LV E/e' medial:  11.5 LV IVS:        1.70 cm LV e' lateral:   12.90 cm/s                        LV E/e' lateral: 6.2  RIGHT  VENTRICLE            IVC RV Basal diam:  3.50 cm    IVC diam: 1.80 cm RV S prime:     9.17 cm/s TAPSE (M-mode): 2.3 cm LEFT ATRIUM              Index        RIGHT ATRIUM           Index LA diam:        4.00 cm  2.12 cm/m   RA Area:     28.10 cm LA Vol (A2C):   109.0 ml 57.72 ml/m  RA Volume:   105.00 ml 55.60 ml/m LA Vol (A4C):   80.7 ml  42.73 ml/m LA Biplane Vol: 95.9 ml  50.78 ml/m  AORTIC VALVE AV Vmax:           244.00 cm/s AV Vmean:          171.000 cm/s AV VTI:            0.499 m AV Peak Grad:      23.8 mmHg AV Mean Grad:      14.0 mmHg LVOT Vmax:         135.50 cm/s LVOT Vmean:        78.050 cm/s LVOT VTI:          0.272 m LVOT/AV VTI ratio: 0.55 MITRAL VALVE               TRICUSPID VALVE MV Area (PHT): 3.12 cm    TR Peak grad:   12.2 mmHg MV Decel Time: 243 msec    TR Vmax:        175.00 cm/s MV E velocity: 80.10 cm/s MV A velocity: 61.20 cm/s  SHUNTS MV E/A ratio:  1.31        Systemic VTI:  0.27 m Armanda Magic MD Electronically signed by Armanda Magic MD Signature Date/Time: 04/15/2023/4:02:33 PM    Final    Medications:    amLODipine  10 mg Oral Daily   arformoterol  15 mcg Nebulization BID   And   umeclidinium bromide  1 puff Inhalation Daily   aspirin  81 mg Oral Daily   calcitRIOL  0.5 mcg Oral Q T,Th,Sa-HD   carvedilol  25 mg Oral BID WC   cephALEXin  1,000 mg Oral Daily   Chlorhexidine Gluconate Cloth  6 each Topical Q0600   Chlorhexidine Gluconate Cloth  6 each Topical Q0600   cloNIDine  0.2 mg Oral BID   clopidogrel  75 mg Oral Daily   hydrALAZINE  50 mg Oral TID   pantoprazole  40 mg Oral Daily     Dialysis Orders: [facility]  TTS EDW 62.1  ,4hr, 2 k, 2 ca Hep 3000 units IV Mircera 100 mcg IV q 2 last 03/08/23  Cacitriol 0.5 mcg po q tx        Assessment/Plan NSTEMI, HO CAD - Per primary. Cardiology following.  No plans for intervention at this time H/O aortic dissection-S/P AVR chronic endocarditis-per primary ESRD -  T,Th,S, short session of hemodialysis  today to get back on schedule Hypertension/volume  -maintain volume status with dialysis.  Continue home blood pressure medications Anemia  - hgb 12.7  no esa needs  fo hgb trend  Metabolic bone disease -continue vitamin D.  Phosphorus near goal.  Continue to monitor.  Ho CVA  meds per admit ASA On Plavix as above/On Repatha outpatient   COPD- inhaler meds  per admit / needs to dc smoking

## 2023-04-17 NOTE — Consult Note (Signed)
Palliative Care Consult Note                                  Date: 04/17/2023   Patient Name: [name]  DOB: [birth year]  MRN: [id number]  Age / Sex: 53 y.o., male  PCP: Loura Back, NP Referring Physician: Lewie Chamber, MD  Reason for Consultation: Establishing goals of care  HPI/Patient Profile: 53 y.o. male  with past medical history of ESRD on HD, aortic stenosis s/p valve replacement at [facility] in 2016, prosthetic valve endocarditis, pericarditis, abdominal aortic dissection s/p repair, COPD, CAD, CVA, seizure, CHF, hypertension, hyperlipidemia, anxiety, depression.  He presented to the ED on 04/14/2023 with chest pain and was admitted with NSTEMI.  Echo showed large pseudo aneurysm involving AVR and associated graft with large presents today surrounding the valve consistent with mild dehiscence.  Palliative Medicine has been consulted for goals of care and medical decision making in the setting of severe valvular heart disease with poor prognosis.  Subjective:   Extensive chart review has been completed prior to meeting with patient/family including labs, vital signs, imaging, progress/consult notes, orders, medications and available advance directive documents.   I met with patient and several family members at bedside to discuss diagnosis, prognosis, and GOC.  His wife, daughter, and mother are present for the discussion.  I introduced Palliative Medicine as specialized medical care for people living with serious illness. It focuses on providing relief from the symptoms and stress of a serious illness.   Created space and opportunity for patient and family to express thoughts and feelings regarding current medical situation. Values and goals of care were attempted to be elicited.  Life Review: Patient lives at home with his wife. They have been married for 17 years. Patient has 9 children. He has been on disability since  approximately 2008, but previously worked as an Personnel officer.  Functional Status: Prior to admission patient was completely independent.  Clinical Assessment and GOC Discussion: We discussed patient's current medical situation and what it means in the larger context of his ongoing co-morbidities. Current clinical status was reviewed.   We reviewed that he has severe valve disease and is not a candidate for redo surgery. Patient does seem to understand that his situation is terminal, and ultimately is accepting of this.   I did explain to patient and family that he is extremely high risk for sudden cardiac death. I strongly encouraged him to consider what medical interventions he would or would not want when his condition starts to deteriorate, keeping in mind the concept of quality of life.    Discussed code status. I encouraged consideration of DNR/DNI status and provided education on evidence-based poor outcomes in similar hospitalized patients, as the cause of cardiac arrest would likely be associated with advanced illness rather than a reversible condition. Patient verbalizes understanding but wishes to discuss further with his family and is not ready to make that decision today.  Discussed and offered outpatient palliative versus hospice services.  Patient is clear at this time that he wishes to continue dialysis, and therefore is not ready for hospice.    Discussed outpatient palliative for ongoing support related to GOC - patient agrees.  I explained to patient and family that outpatient palliative would most likely not be able to provide ongoing refills for pain medication, and that he would need to arrange that with his PCP.  MOST form was  explained and blank copy provided for review. Questions and concerns were addressed.   Review of Systems  Constitutional:  Positive for fatigue.  Cardiovascular:  Positive for chest pain.    Objective:   Primary Diagnoses: Present on  Admission:  NSTEMI (non-ST elevated myocardial infarction) (HCC)  Hyperlipidemia LDL goal <70  GERD without esophagitis  Essential hypertension  Coronary artery disease involving native coronary artery of native heart with angina pectoris (HCC)  COPD (chronic obstructive pulmonary disease) (HCC)  Chronic combined systolic and diastolic CHF (congestive heart failure) (HCC)  Prosthetic valve endocarditis (HCC)  Pseudoaneurysm (HCC)   Physical Exam Vitals reviewed.  Constitutional:      General: He is not in acute distress.    Appearance: He is ill-appearing.  Cardiovascular:     Rate and Rhythm: Normal rate.  Pulmonary:     Effort: Pulmonary effort is normal.  Neurological:     Mental Status: He is alert and oriented to person, place, and time.     Palliative Assessment/Data: PPS 50%     Assessment & Plan:   SUMMARY OF RECOMMENDATIONS   Code status was discussed in detail - patient wishes to discuss further with his family Goal of care is to get home and have adequate pain control Patient is not ready for hospice at this time Outpatient palliative referral  Patient will need to work with his PCP for ongoing pain meds, as outpatient palliative does not provide pain management  Primary Decision Maker: PATIENT  Code Status/Advance Care Planning: Full code  Symptom Management:  Dilaudid 4 mg p.o. every 4 hours as needed for pain Dilaudid 1.5 mg IV every 2 hours as needed for severe pain (not relieved by oral Dilaudid)  Prognosis:  poor  Discharge Planning:  Home with Palliative Services    Discussed with: Dr. Frederick Peers   Thank you for allowing [name] to participate in the care of [name]   Time Total: 80 minutes  Detailed review of medical records (labs, imaging, vital signs), medically appropriate exam, discussed with treatment team, counseling and education to patient, family, & staff, documenting clinical information, medication management, coordination  of care.   Signed by: Sherlean Foot, NP Palliative Medicine Team  Team Phone # [phone]  For individual providers, please see AMION

## 2023-04-18 DIAGNOSIS — I25119 Atherosclerotic heart disease of native coronary artery with unspecified angina pectoris: Secondary | ICD-10-CM | POA: Diagnosis not present

## 2023-04-18 DIAGNOSIS — I214 Non-ST elevation (NSTEMI) myocardial infarction: Secondary | ICD-10-CM | POA: Diagnosis not present

## 2023-04-18 DIAGNOSIS — T826XXD Infection and inflammatory reaction due to cardiac valve prosthesis, subsequent encounter: Secondary | ICD-10-CM | POA: Diagnosis not present

## 2023-04-18 DIAGNOSIS — N186 End stage renal disease: Secondary | ICD-10-CM | POA: Diagnosis not present

## 2023-04-18 DIAGNOSIS — Z7189 Other specified counseling: Secondary | ICD-10-CM | POA: Diagnosis not present

## 2023-04-18 DIAGNOSIS — Z515 Encounter for palliative care: Secondary | ICD-10-CM | POA: Diagnosis not present

## 2023-04-18 MED ORDER — HYDROMORPHONE HCL 2 MG PO TABS
4.0000 mg | ORAL_TABLET | ORAL | Status: DC | PRN
  Filled 2023-04-18: qty 2

## 2023-04-18 MED ORDER — SENNA 8.6 MG PO TABS
1.0000 | ORAL_TABLET | Freq: Every day | ORAL | Status: DC
  Administered 2023-04-19: 8.6 mg via ORAL
  Filled 2023-04-18: qty 1

## 2023-04-18 MED ORDER — HYDROMORPHONE HCL 2 MG PO TABS
6.0000 mg | ORAL_TABLET | ORAL | Status: DC | PRN
  Administered 2023-04-18 – 2023-04-19 (×2): 6 mg via ORAL
  Filled 2023-04-18 (×3): qty 3

## 2023-04-18 NOTE — Progress Notes (Signed)
[facility] Progress Note   Subjective: Seen in room. Noted Palliative Care Consult 04/17/2023. He says he wishes to do HD as long as possible. Very pleasant. Ongoing issues with pain that seems controlled at present. Emotional support to pt.       Objective Vitals:   04/17/23 2021 04/17/23 2318 04/18/23 0326 04/18/23 0800  BP: 127/79 96/62 114/82 122/85  Pulse: 93 90 99 89  Resp: 20 14 20 16   Temp: 98.2 F (36.8 C) 98 F (36.7 C) 98.5 F (36.9 C) 98.8 F (37.1 C)  TempSrc: Oral Oral Oral Oral  SpO2: 95% 100% 100% 98%  Weight:      Height:       Physical Exam General: Chronically ill appearing male in NAD Heart: SR on monitor.. RRR 2/6 systolic M.  Lungs: few scattered upper airway rhonci, no WOB Abdomen:NABS, NT Extremities:No LE edema Dialysis Access: L AVF + T/B    Additional Objective Labs: Basic Metabolic Panel: Recent Labs  Lab 04/14/23 1020 04/15/23 0550 04/15/23 1046 04/16/23 0938  NA 138 135  --  133*  K 3.4* 3.5  --  3.7  CL 96* 96*  --  96*  CO2 24 25  --  23  GLUCOSE 104* 88  --  76  BUN 41* 43*  --  54*  CREATININE 6.62* 7.16*  --  8.34*  CALCIUM 10.0 9.5  --  9.4  PHOS  --   --  5.5* 5.7*   Liver Function Tests: Recent Labs  Lab 04/16/23 0938  ALBUMIN 3.5   No results for input(s): "LIPASE", "AMYLASE" in the last 168 hours. CBC: Recent Labs  Lab 04/14/23 1020 04/15/23 0550 04/15/23 2359 04/16/23 0937  WBC 6.1 5.6 5.0 5.0  HGB 13.6 12.7* 11.1* 11.2*  HCT 42.9 39.6 33.8* 34.7*  MCV 96.6 95.7 92.9 93.3  PLT 89* 78* 67* 65*   Blood Culture    Component Value Date/Time   SDES BLOOD RIGHT ANTECUBITAL 03/02/2023 0051   SPECREQUEST  03/02/2023 0051    BOTTLES DRAWN AEROBIC AND ANAEROBIC Blood Culture adequate volume   CULT  03/02/2023 0051    NO GROWTH 5 DAYS Performed at [facility], [address] [place], [place] [address]    REPTSTATUS 03/07/2023 FINAL 03/02/2023 0051    Cardiac Enzymes: No results  for input(s): "CKTOTAL", "CKMB", "CKMBINDEX", "TROPONINI" in the last 168 hours. CBG: No results for input(s): "GLUCAP" in the last 168 hours. Iron Studies: No results for input(s): "IRON", "TIBC", "TRANSFERRIN", "FERRITIN" in the last 72 hours. @lablastinr3 @ Studies/Results: No results found. Medications:   amLODipine  10 mg Oral Daily   arformoterol  15 mcg Nebulization BID   And   umeclidinium bromide  1 puff Inhalation Daily   aspirin  81 mg Oral Daily   calcitRIOL  0.5 mcg Oral Q T,Th,Sa-HD   carvedilol  25 mg Oral BID WC   cephALEXin  1,000 mg Oral Daily   Chlorhexidine Gluconate Cloth  6 each Topical Q0600   Chlorhexidine Gluconate Cloth  6 each Topical Q0600   cloNIDine  0.2 mg Oral BID   clopidogrel  75 mg Oral Daily   hydrALAZINE  50 mg Oral TID   pantoprazole  40 mg Oral Daily     Dialysis Orders: [facility]: [facility]  TTS EDW 62.1  ,4hr, 2 k, 2 ca Hep 3000 units IV Mircera 100 mcg IV q 2 last 03/08/23  Cacitriol 0.5 mcg po q tx  Assessment/Plan NSTEMI, HO CAD - Per primary. Cardiology following. Medical management. Poor prognosis. Palliative Care consulted for goals of care.  H/O aortic dissection-S/P AVR chronic endocarditis-per primary ESRD -  T,Th,S HD today off schedule. HD 04/16/2023. Was supposed to have had HD 04/17/2023 to resume T,Th,S schedule but was bumped because of staffing issues. Will have HD off schedule tomorrow and the resume T,Th,S schedule 04/20/2023 Hypertension/volume  - no excess vol , bp stable  home meds amlodipine, carvedilol, clonidine, hydralazine  Anemia  - hgb 12.7  no esa needs  fo hgb trend  Metabolic bone disease -  Ca 9.5  , ad on phos ,not on binder with meals  fu labs , VDRA on hd   Ho CVA  meds per admit ASA On Plavix as above/On Repatha outpatient   COPD- inhaler meds  per admit / needs to dc smoking     Mykira Hofmeister H. Jahmani Staup NP-C 04/18/2023, 9:21 AM  [employer] [phone]

## 2023-04-18 NOTE — Progress Notes (Signed)
Palliative Medicine Progress Note   Patient Name: [name]       Date: 04/18/2023 DOB: [birth year]  Age: 53 y.o. MRN#: [id number] Attending Physician: Lewie Chamber, MD Primary Care Physician: Loura Back, NP Admit Date: 04/14/2023 .  HPI/Patient Profile: 53 y.o. male  with past medical history of ESRD on HD, aortic stenosis s/p valve replacement at [facility] in 2016, prosthetic valve endocarditis, pericarditis, abdominal aortic dissection s/p repair, COPD, CAD, CVA, seizure, CHF, hypertension, hyperlipidemia, anxiety, depression.  He presented to the ED on 04/14/2023 with chest pain and was admitted with NSTEMI.  Echo showed large pseudo aneurysm involving AVR and associated graft with large presents today surrounding the valve consistent with mild dehiscence.   Palliative Medicine has been consulted for goals of care and medical decision making in the setting of severe valvular heart disease with poor prognosis.  Subjective: Chart reviewed including labs, vital signs, imaging, progress/consult notes, orders, medications, and MAR.   This NP assessed patient at the bedside as a follow up for palliative medicine needs and symptom management.  Patient reports that oral dilaudid dose is reducing his chest pain from a 10/10 to a 5/10, and is wearing off before he can have it again. Discussed increasing dose and frequency of dilaudid - patient agrees.   I again emphasized that he will need to work with his PCP for ongoing pain meds, as outpatient palliative does not provide pain management. Patient reports he has an appointment with his PCP on 2/22, but will call tomorrow to try and get an earlier appointment.  Discussed with Dr. Mordecai Maes in regards to providing enough pain medication at discharge to bridge  patient until he can see his PCP.    Objective:  Physical Exam Constitutional:      General: He is not in acute distress. Pulmonary:     Effort: No respiratory distress.  Neurological:     Mental Status: He is alert and oriented to person, place, and time.             Palliative Medicine Assessment & Plan   Assessment: Principal Problem:   NSTEMI (non-ST elevated myocardial infarction) (HCC) Active Problems:   Hyperlipidemia LDL goal <70   GERD without esophagitis   S/P aortic dissection repair   Essential hypertension   H/O aortic valve replacement  History of aortic dissection   History of stroke   Coronary artery disease involving native coronary artery of native heart with angina pectoris (HCC)   COPD (chronic obstructive pulmonary disease) (HCC)   ESRD on dialysis (HCC)   Chronic combined systolic and diastolic CHF (congestive heart failure) (HCC)   Prosthetic valve endocarditis (HCC)   Pseudoaneurysm (HCC)   Goals of care, counseling/discussion    Recommendations/Plan: Agree with DNR/DNI Goal of care is to get home and have adequate pain control Patient is not ready for hospice at this time as he wishes to continue dialysis Outpatient palliative referral - TOC order placed Patient will need to work with his PCP for ongoing pain meds, as outpatient palliative does not provide pain management   Symptom Management (for chest pain in the setting of end-stage valvular heart disease):  Increased  dose and frequency of Dilaudid  to 6 mg p.o. every 3 hours as needed for pain Dilaudid 1.5 mg IV every 2 hours as needed for severe pain (not relieved by oral Dilaudid) Bowel regimen - sennakot 1 tablet daily   Prognosis:  poor  Discharge Planning: Home with Palliative Services   Thank you for allowing the Palliative Medicine Team to assist in the care of this patient.   Time: 35 minutes   Merry Proud, NP   Please contact Palliative Medicine Team  phone at [phone] for questions and concerns.  For individual providers, please see AMION.

## 2023-04-18 NOTE — Progress Notes (Signed)
Progress Note    [name]   UVO:[id number]  DOB: 10-May-1970  DOA: 04/14/2023     3 PCP: Loura Back, NP  Initial CC: CP  Hospital Course: [name] is a 53 y.o. male with medical history significant of hypertension, hyperlipidemia, CVA, seizure, CAD, ESRD on HD, GERD, CHF, anxiety, depression, abdominal aortic dissection s/p repair, COPD, R IJ thrombosis, prosthetic valve endocarditis, aortic stenosis, pericarditis presenting with chest pain. Pain developed during showering and radiated to left shoulder, neck, back. He was brought to the ER due to persistent pain. CT angio chest obtained which was negative for acute abnormalities.  Showed underlying stable postsurgical changes of repair of ascending aorta and endovascular stent graft repair of thoracic aorta. Troponins were elevated on admission and he was started on heparin drip and admitted for cardiology evaluation.  Interval History:  Started on oral dilaudid yesterday by palliative care. For the most part his pain is controlled but he feels that the pill wears off quicker than the IV did.  Regimen may be further adjusted today, relayed his thoughts to palliative care. He was also amenable with changing his code status this morning to DNR/DNI.   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  He has been told by cardiology that prognosis is poor and he needs to consider palliative/comfort care -He does seem at peace with understanding he is declining however he does have great difficulty with deciding how aggressive to remain especially in terms of his dialysis.  His main goal seems to be focusing on pain control however he is unsure if that is at the expense of him becoming too lethargic or missing dialysis sessions -Discussed with palliative care, greatly appreciate assistance.  - patient okay with DNR/DNI status on 04/18/23; status updated and DNR form filled out  ESRD on dialysis (HCC) - on TTSa HD - nephrology consulted on admission; HD per nephro  Coronary artery disease involving native coronary artery of native heart with angina pectoris (HCC) - continue DAPT - patient noted to not tolerate nitroglycerin -Continue Dilaudid for pain control  History of aortic dissection - Patient has known history of aortic valve stenosis status post bioprosthetic aortic valve replacement in 2016.  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 - Echo last month showed degeneration of the prosthetic aortic valve and large pseudoaneurysm with aortic stenosis. 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  COPD (chronic obstructive pulmonary disease) (HCC) - Continue breathing treatments - No S/S exacerbation  History of stroke - On aspirin, Plavix,  Repatha  H/O aortic valve replacement - See above  Essential  hypertension - Continue antihypertensives  S/P aortic dissection repair - See above  GERD without esophagitis - Continue PPI  Hyperlipidemia LDL goal <70 - On Repatha outpatient   Old records reviewed in assessment of this patient  Antimicrobials:   DVT prophylaxis:  s/p heparin drip   Code Status:   Code Status: Limited: Do not attempt resuscitation (DNR) -DNR-LIMITED -Do Not Intubate/DNI   Mobility Assessment (Last 72 Hours)     Mobility Assessment     Row Name 04/18/23 0745 04/17/23 2100 04/17/23 0730 04/16/23 2020 04/16/23 1335   Does patient have an order for bedrest or is patient medically unstable No - Continue assessment No - Continue assessment No - Continue assessment No - Continue assessment No - Continue assessment   What is the highest level of mobility based on the progressive mobility assessment? Level 5 (Walks with assist in room/hall) - Balance while stepping forward/back and can walk in room with assist - Complete Level 6 (Walks independently in room and hall) - Balance while walking in room without assist - Complete Level 6 (Walks independently in room and hall) - Balance while walking in room without assist - Complete Level 6 (Walks independently in room and hall) - Balance while walking in room without assist - Complete Level 5 (Walks with assist in room/hall) - Balance while stepping forward/back and can walk in room with assist - Complete    Row Name 04/15/23 2004           Does patient have an order for bedrest or is patient medically unstable No - Continue assessment       What is the highest level of mobility based on the progressive mobility assessment? Level 5 (Walks with assist in room/hall) - Balance while stepping forward/back and can walk in room with assist - Complete                Barriers to discharge: none Disposition Plan:  Home Status is: Inpt  Objective: Blood pressure 110/78, pulse 82, temperature 98.7 F (37.1 C),  temperature source Oral, resp. rate 17, height 5\' 11"  (1.803 m), weight 65.8 kg, SpO2 98%.  Examination:  Physical Exam Constitutional:      General: He is not in acute distress.    Appearance: Normal appearance.  HENT:     Head: Normocephalic and atraumatic.     Mouth/Throat:     Mouth: Mucous membranes are moist.  Eyes:     Extraocular Movements: Extraocular movements intact.  Cardiovascular:     Rate and Rhythm: Normal rate and regular rhythm.     Heart sounds: Murmur heard.  Pulmonary:     Effort: Pulmonary effort is normal. No respiratory distress.     Breath sounds: Normal breath sounds. No wheezing.  Abdominal:     General: Bowel sounds are normal. There is no distension.     Palpations: Abdomen is soft.     Tenderness: There is no abdominal tenderness.  Musculoskeletal:        General: Normal range of motion.     Cervical back: Normal range of motion and neck supple.  Skin:    General: Skin is warm and dry.  Neurological:     General: No focal deficit present.     Mental Status: He is alert.  Psychiatric:        Mood and Affect: Mood normal.        Behavior: Behavior  normal.      Consultants:  Cardiology Palliative care  Procedures:    Data Reviewed: No results found for this or any previous visit (from the past 24 hours).   I have reviewed pertinent nursing notes, vitals, labs, and images as necessary. I have ordered labwork to follow up on as indicated.  I have reviewed the last notes from staff over past 24 hours. I have discussed patient's care plan and test results with nursing staff, CM/SW, and other staff as appropriate.    LOS: 3 days   Lewie Chamber, MD [facility] 04/18/2023, 1:03 PM

## 2023-04-19 ENCOUNTER — Other Ambulatory Visit (HOSPITAL_COMMUNITY): Payer: Self-pay

## 2023-04-19 DIAGNOSIS — Z7189 Other specified counseling: Secondary | ICD-10-CM | POA: Diagnosis not present

## 2023-04-19 DIAGNOSIS — N186 End stage renal disease: Secondary | ICD-10-CM | POA: Diagnosis not present

## 2023-04-19 DIAGNOSIS — I25119 Atherosclerotic heart disease of native coronary artery with unspecified angina pectoris: Secondary | ICD-10-CM | POA: Diagnosis not present

## 2023-04-19 DIAGNOSIS — I214 Non-ST elevation (NSTEMI) myocardial infarction: Secondary | ICD-10-CM | POA: Diagnosis not present

## 2023-04-19 LAB — RENAL FUNCTION PANEL
Albumin: 3.7 g/dL (ref 3.5–5.0)
Anion gap: 16 — ABNORMAL HIGH (ref 5–15)
BUN: 60 mg/dL — ABNORMAL HIGH (ref 6–20)
CO2: 22 mmol/L (ref 22–32)
Calcium: 9.7 mg/dL (ref 8.9–10.3)
Chloride: 95 mmol/L — ABNORMAL LOW (ref 98–111)
Creatinine, Ser: 8.94 mg/dL — ABNORMAL HIGH (ref 0.61–1.24)
GFR, Estimated: 7 mL/min — ABNORMAL LOW (ref >60)
Glucose, Bld: 96 mg/dL (ref 70–99)
Phosphorus: 7.3 mg/dL — ABNORMAL HIGH (ref 2.5–4.6)
Potassium: 4.5 mmol/L (ref 3.5–5.1)
Sodium: 133 mmol/L — ABNORMAL LOW (ref 135–145)

## 2023-04-19 LAB — CBC
HCT: 35.8 % — ABNORMAL LOW (ref 39.0–52.0)
Hemoglobin: 11.7 g/dL — ABNORMAL LOW (ref 13.0–17.0)
MCH: 30.5 pg (ref 26.0–34.0)
MCHC: 32.7 g/dL (ref 30.0–36.0)
MCV: 93.2 fL (ref 80.0–100.0)
Platelets: 74 10*3/uL — ABNORMAL LOW (ref 150–400)
RBC: 3.84 MIL/uL — ABNORMAL LOW (ref 4.22–5.81)
RDW: 13.8 % (ref 11.5–15.5)
WBC: 4.7 10*3/uL (ref 4.0–10.5)
nRBC: 0 % (ref 0.0–0.2)

## 2023-04-19 MED ORDER — HYDROMORPHONE HCL 1 MG/ML IJ SOLN
1.5000 mg | INTRAMUSCULAR | Status: DC | PRN
  Administered 2023-04-19: 1.5 mg via INTRAVENOUS
  Filled 2023-04-19: qty 1.5

## 2023-04-19 MED ORDER — HYDROMORPHONE HCL 4 MG PO TABS
6.0000 mg | ORAL_TABLET | ORAL | 0 refills | Status: AC | PRN
  Filled 2023-04-19: qty 60, 8d supply, fill #0

## 2023-04-19 MED ORDER — HEPARIN SODIUM (PORCINE) 1000 UNIT/ML IJ SOLN
3000.0000 [IU] | Freq: Once | INTRAMUSCULAR | Status: DC
Start: 2023-04-19 — End: 2023-04-19
  Filled 2023-04-19: qty 3

## 2023-04-19 MED ORDER — SENNA 8.6 MG PO TABS
1.0000 | ORAL_TABLET | Freq: Every day | ORAL | 1 refills | Status: AC
  Filled 2023-04-19: qty 90, 90d supply, fill #0

## 2023-04-19 NOTE — TOC Transition Note (Signed)
Transition of Care [facility]) - Discharge Note Donn Pierini RN, BSN Transitions of Care Unit 4E- RN Case Manager See Treatment Team for direct phone #   Patient Details  Name: [name] MRN: [id number] Date of Birth: 1970/03/25  Transition of Care [facility]) CM/SW Contact:  Darrold Span, RN Phone Number: 04/19/2023, 3:06 PM   Clinical Narrative:    Pt stable for transition home today after HD, referral noted for outpt PC referral.   CM spoke with pt at bedside to offer choice for PC needs. Per pt he would prefer agency here in [place], [place] offered and pt agreeable.   Family to transport home.   Call made to [facility] liaison for outpt PC needs- liaison to follow up and [facility] to call pt's wife per pt request- contact # [phone]   Final next level of care: Home/Self Care Barriers to Discharge: No Barriers Identified   Patient Goals and CMS Choice Patient states their goals for this hospitalization and ongoing recovery are:: return home   Choice offered to / list presented to : NA      Discharge Placement               Home        Discharge Plan and Services Additional resources added to the After Visit Summary for     Discharge Planning Services: CM Consult, Other - See comment (outpt Palliative) Post Acute Care Choice: NA          DME Arranged: N/A DME Agency: NA         HH Agency: [facility] Date [facility] Agency Contacted: 04/19/23 Time HH Agency Contacted: [phone] Representative spoke with at [facility] Agency: Melissa  Social Drivers of Health (SDOH) Interventions SDOH Screenings   Food Insecurity: No Food Insecurity (04/15/2023)  Housing: Low Risk  (04/15/2023)  Transportation Needs: No Transportation Needs (04/15/2023)  Utilities: Not At Risk (04/15/2023)  Depression (PHQ2-9): Low Risk  (02/17/2023)  Financial Resource Strain: Low Risk  (07/07/2022)   Received from [facility], [facility]  Social  Connections: Unknown (05/11/2022)   Received from [facility], [facility]  Tobacco Use: High Risk (04/14/2023)     Readmission Risk Interventions    04/19/2023    3:06 PM 09/11/2020    2:33 PM  Readmission Risk Prevention Plan  Transportation Screening Complete Complete  Medication Review (RN Care Manager) Complete Complete  PCP or Specialist appointment within 3-5 days of discharge  Complete  HRI or Home Care Consult Complete Complete  SW Recovery Care/Counseling Consult Complete Complete  Palliative Care Screening Complete Not Applicable  Skilled Nursing Facility Not Applicable Not Applicable

## 2023-04-19 NOTE — Discharge Summary (Signed)
Physician Discharge Summary   [name]:[id number] DOB: September 07, 1970 DOA: 04/14/2023  PCP: Loura Back, NP  Admit date: 04/14/2023 Discharge date: 04/19/2023  Admitted From: Home Disposition:  Home Discharging physician: Lewie Chamber, MD Barriers to discharge: none  Recommendations at discharge: Continue palliative care at discharge; consider hospice with any further decline Patient wishes to remain on HD; may need to be ongoing conversation based on QOL and functional status Follow up pain control on dilaudid and may need adjustment   Discharge Condition: poor CODE STATUS: DNR Diet recommendation:  Diet Orders (From admission, onward)     Start     Ordered   04/15/23 1056  Diet renal with fluid restriction Fluid restriction: 1200 mL Fluid; Room service appropriate? Yes; Fluid consistency: Thin  Diet effective now       Question Answer Comment  Fluid restriction: 1200 mL Fluid   Room service appropriate? Yes   Fluid consistency: Thin      04/15/23 1055            Hospital Course: [name] is a 53 y.o. male with medical history significant of hypertension, hyperlipidemia, CVA, seizure, CAD, ESRD on HD, GERD, CHF, anxiety, depression, abdominal aortic dissection s/p repair, COPD, R IJ thrombosis, prosthetic valve endocarditis, aortic stenosis, pericarditis presenting with chest pain. Pain developed during showering and radiated to left shoulder, neck, back. He was brought to the ER due to persistent pain. CT angio chest obtained which was negative for acute abnormalities.  Showed underlying stable postsurgical changes of repair of ascending aorta and endovascular stent graft repair of thoracic aorta. Troponins were elevated on admission and he was started on heparin drip and admitted for cardiology evaluation.  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  He has been told by cardiology that prognosis is poor and he needs to consider palliative/comfort care -He does seem at peace with understanding he is declining however he does have great difficulty with deciding how aggressive to remain especially in terms of his dialysis.  His main goal seems to be focusing on pain control however he is unsure if that is at the expense of him becoming too lethargic or missing dialysis sessions -Discussed with palliative care, greatly appreciate assistance.  - patient okay with DNR/DNI status on 04/18/23; status updated and DNR form filled out - dilaudid PO prescribed at d/c and he says he has appt with PCP on 2/10 for followup and more refills if needed; prescription should last based off how much he's actually been asking for it in hospital (max 4-5 times a day in hospital)  ESRD on dialysis (HCC) - on TTSa HD - nephrology consulted on admission; HD per nephro  Coronary artery disease involving native coronary artery of native heart with angina pectoris (HCC) - continue DAPT - patient noted  to not tolerate nitroglycerin -Continue Dilaudid for pain control  History of aortic dissection - Patient has known history of aortic valve stenosis status post bioprosthetic aortic valve  replacement in 2016.  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 - Echo last month showed degeneration of the prosthetic aortic valve and large pseudoaneurysm with aortic stenosis. 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  COPD (chronic obstructive pulmonary disease) (HCC) - Continue breathing treatments - No S/S exacerbation  History of stroke - On aspirin, Plavix, Repatha  H/O aortic valve replacement - See above  Essential hypertension - Continue antihypertensives  S/P aortic dissection repair - See above  GERD without esophagitis - Continue PPI  Hyperlipidemia LDL goal <70 - On Repatha outpatient    Principal Diagnosis: NSTEMI (non-ST elevated myocardial infarction) [facility])  Discharge Diagnoses: Active Hospital Problems   Diagnosis Date Noted   NSTEMI (non-ST elevated myocardial infarction) (HCC) 12/02/2017    Priority: 1.   Goals of care, counseling/discussion 04/16/2023    Priority: 2.   ESRD on dialysis [facility]) 09/06/2020    Priority: 2.   Coronary artery disease involving native coronary artery of native heart with angina pectoris (HCC) 12/06/2017    Priority: 2.   History of aortic dissection 06/03/2016    Priority: 3.   Chronic combined systolic and diastolic CHF (congestive heart failure) (HCC) 06/11/2021    Priority: 4.   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     Discharge Instructions     Increase activity slowly   Complete by: As directed       Allergies as of 04/19/2023       Reactions   Clonidine Derivatives Nausea And Vomiting, Other (See Comments)   Tablets by mouth = Tolerated Patches on the skin = Nausea & vomiting after wearing 4-5 days    Imdur [isosorbide Nitrate] Other (See Comments)   Headaches -- patient instructed to continue taking   Tramadol Other (See Comments)   Headaches   Clonidine Nausea And Vomiting        Medication List     STOP taking these medications    HYDROcodone-acetaminophen 5-325 MG tablet Commonly known as: NORCO/VICODIN   oxyCODONE-acetaminophen 5-325 MG tablet Commonly known as: Percocet       TAKE these medications    acetaminophen 500 MG tablet Commonly known as: TYLENOL Take 1,000 mg by mouth every 6 (six) hours as needed for mild pain (pain score 1-3), moderate pain (pain score 4-6) or headache.   albuterol 108 (90 Base) MCG/ACT inhaler Commonly known as: VENTOLIN HFA Inhale 1 puff into the lungs every 6 (six) hours as needed for wheezing or shortness of breath.   amLODipine 10 MG tablet Commonly known as: NORVASC TAKE 1 TABLET(10 MG) BY MOUTH DAILY   aspirin EC 81 MG tablet Take 1 tablet (81 mg total) by  mouth daily.   carvedilol 12.5 MG tablet Commonly known as: COREG Take 1 tablet (12.5 mg total) by mouth 2 (two) times daily.   cephALEXin 500 MG capsule Commonly known as: KEFLEX Take 2 capsules (1,000 mg total) by mouth daily. Daily at night time, start from 12/9 after completion of IV course.   cloNIDine 0.2 MG tablet Commonly known as: CATAPRES Take 0.2 mg by mouth 2 (two) times daily.   hydrALAZINE 100 MG tablet Commonly known as: APRESOLINE Take 0.5 tablets (50 mg total) by mouth 3 (three) times daily.   HYDROmorphone 4 MG  tablet Commonly known as: DILAUDID Take 1.5 tablets (6 mg total) by mouth every 3 (three) hours as needed for severe pain (pain score 7-10) or moderate pain (pain score 4-6).   lidocaine 4 % Place 1 patch onto the skin daily. Apply to lower back   lidocaine-prilocaine cream Commonly known as: EMLA Apply 1 application. topically as needed (prior to port being accessed). Use on dialysis days (Tues, Thurs and Sat)   meclizine 25 MG tablet Commonly known as: ANTIVERT Take 1 tablet (25 mg total) by mouth 3 (three) times daily as needed for dizziness.   nitroGLYCERIN 0.4 MG SL tablet Commonly known as: NITROSTAT Place 1 tablet (0.4 mg total) under the tongue every 5 (five) minutes as needed for chest pain.   pantoprazole 40 MG tablet Commonly known as: PROTONIX Take 1 tablet (40 mg total) by mouth daily.   polyethylene glycol powder 17 GM/SCOOP powder Commonly known as: GLYCOLAX/MIRALAX Take 17 g by mouth daily. What changed:  when to take this reasons to take this   Repatha SureClick 140 MG/ML Soaj Generic drug: Evolocumab Inject 140 mg into the skin every 14 (fourteen) days.   senna 8.6 MG Tabs tablet Commonly known as: SENOKOT Take 1 tablet (8.6 mg total) by mouth daily. Start taking on: April 20, 2023   Stiolto Respimat 2.5-2.5 MCG/ACT Aers Generic drug: Tiotropium Bromide-Olodaterol Inhale 2 puffs into the lungs at bedtime.        Allergies  Allergen Reactions   Clonidine Derivatives Nausea And Vomiting and Other (See Comments)    Tablets by mouth = Tolerated  Patches on the skin = Nausea & vomiting after wearing 4-5 days    Imdur [Isosorbide Nitrate] Other (See Comments)    Headaches -- patient instructed to continue taking    Tramadol Other (See Comments)    Headaches    Clonidine Nausea And Vomiting    Consultations: Cardiology  Procedures:   Discharge Exam: BP 109/76 (BP Location: Right Arm)   Pulse 69   Temp 98 F (36.7 C) (Oral)   Resp  13   Ht 5\' 11"  (1.803 m)   Wt 65.2 kg   SpO2 98%   BMI 20.05 kg/m  Physical Exam Constitutional:      General: He is not in acute distress.    Appearance: Normal appearance.  HENT:     Head: Normocephalic and atraumatic.     Mouth/Throat:     Mouth: Mucous membranes are moist.  Eyes:     Extraocular Movements: Extraocular movements intact.  Cardiovascular:     Rate and Rhythm: Normal rate and regular rhythm.     Heart sounds: Murmur heard.  Pulmonary:     Effort: Pulmonary effort is normal. No respiratory distress.     Breath sounds: Normal breath sounds. No wheezing.  Abdominal:     General: Bowel sounds are normal. There is no distension.  Palpations: Abdomen is soft.     Tenderness: There is no abdominal tenderness.  Musculoskeletal:        General: Normal range of motion.     Cervical back: Normal range of motion and neck supple.  Skin:    General: Skin is warm and dry.  Neurological:     General: No focal deficit present.     Mental Status: He is alert.  Psychiatric:        Mood and Affect: Mood normal.        Behavior: Behavior normal.      The results of significant diagnostics from this hospitalization (including imaging, microbiology, ancillary and laboratory) are listed below for reference.   Microbiology: No results found for this or any previous visit (from the past 240 hours).   Labs: BNP (last 3 results) Recent Labs    01/09/23 1539 01/17/23 1643 03/02/23 0552  BNP 1,044.9* 1,461.9* >4,500.0*   Basic Metabolic Panel: Recent Labs  Lab 04/14/23 1020 04/14/23 1701 04/15/23 0550 04/15/23 1046 04/16/23 0938 04/19/23 1105  NA 138  --  135  --  133* 133*  K 3.4*  --  3.5  --  3.7 4.5  CL 96*  --  96*  --  96* 95*  CO2 24  --  25  --  23 22  GLUCOSE 104*  --  88  --  76 96  BUN 41*  --  43*  --  54* 60*  CREATININE 6.62*  --  7.16*  --  8.34* 8.94*  CALCIUM 10.0  --  9.5  --  9.4 9.7  MG  --  2.2  --   --   --   --   PHOS  --   --   --   5.5* 5.7* 7.3*   Liver Function Tests: Recent Labs  Lab 04/16/23 0938 04/19/23 1105  ALBUMIN 3.5 3.7   No results for input(s): "LIPASE", "AMYLASE" in the last 168 hours. No results for input(s): "AMMONIA" in the last 168 hours. CBC: Recent Labs  Lab 04/14/23 1020 04/15/23 0550 04/15/23 2359 04/16/23 0937 04/19/23 1105  WBC 6.1 5.6 5.0 5.0 4.7  HGB 13.6 12.7* 11.1* 11.2* 11.7*  HCT 42.9 39.6 33.8* 34.7* 35.8*  MCV 96.6 95.7 92.9 93.3 93.2  PLT 89* 78* 67* 65* 74*   Cardiac Enzymes: No results for input(s): "CKTOTAL", "CKMB", "CKMBINDEX", "TROPONINI" in the last 168 hours. BNP: Invalid input(s): "POCBNP" CBG: No results for input(s): "GLUCAP" in the last 168 hours. D-Dimer No results for input(s): "DDIMER" in the last 72 hours. Hgb A1c No results for input(s): "HGBA1C" in the last 72 hours. Lipid Profile No results for input(s): "CHOL", "HDL", "LDLCALC", "TRIG", "CHOLHDL", "LDLDIRECT" in the last 72 hours. Thyroid function studies No results for input(s): "TSH", "T4TOTAL", "T3FREE", "THYROIDAB" in the last 72 hours.  Invalid input(s): "FREET3" Anemia work up No results for input(s): "VITAMINB12", "FOLATE", "FERRITIN", "TIBC", "IRON", "RETICCTPCT" in the last 72 hours. Urinalysis    Component Value Date/Time   COLORURINE YELLOW 03/02/2023 0200   APPEARANCEUR CLEAR 03/02/2023 0200   LABSPEC 1.014 03/02/2023 0200   PHURINE 5.0 03/02/2023 0200   GLUCOSEU 50 (A) 03/02/2023 0200   HGBUR NEGATIVE 03/02/2023 0200   HGBUR negative 02/18/2009 0901   BILIRUBINUR NEGATIVE 03/02/2023 0200   KETONESUR NEGATIVE 03/02/2023 0200   PROTEINUR >=300 (A) 03/02/2023 0200   UROBILINOGEN 0.2 01/02/2015 1945   NITRITE NEGATIVE 03/02/2023 0200   LEUKOCYTESUR NEGATIVE 03/02/2023 0200   Sepsis  Labs Recent Labs  Lab 04/15/23 0550 04/15/23 2359 04/16/23 0937 04/19/23 1105  WBC 5.6 5.0 5.0 4.7   Microbiology No results found for this or any previous visit (from the past 240  hours).  Procedures/Studies: ECHOCARDIOGRAM COMPLETE Result Date: 04/15/2023    ECHOCARDIOGRAM REPORT   Patient Name:   [name] Date of Exam: 04/15/2023 Medical Rec #:  [id number]     Height:       71.0 in Accession #:    [id number]    Weight:       154.3 lb Date of Birth:  09/09/1970      BSA:          1.889 m Patient Age:    52 years      BP:           97/64 mmHg Patient Gender: M             HR:           96 bpm. Exam Location:  Inpatient Procedure: 2D Echo, Color Doppler and Cardiac Doppler Indications:    NSTEMI  History:        Patient has prior history of Echocardiogram examinations, most                 recent 02/15/2023. Cardiomyopathy, CAD, Bentall procedure, end                 stage renal disease and COPD; Risk Factors:Hypertension and                 Dyslipidemia.                 Aortic Valve: 27 mm Mitraflow valve is present in the aortic                 position.  Sonographer:    Delcie Roch RDCS Referring Phys: [id number] Cecille Po MELVIN IMPRESSIONS  1. Left ventricular ejection fraction, by estimation, is 60 to 65%. The left ventricle has normal function. The left ventricle has no regional wall motion abnormalities. There is moderate concentric left ventricular hypertrophy. Left ventricular diastolic parameters are consistent with Grade II diastolic dysfunction (pseudonormalization).  2. Right ventricular systolic function is normal. The right ventricular size is normal. There is normal pulmonary artery systolic pressure. The estimated right ventricular systolic pressure is 15.2 mmHg.  3. Left atrial size was severely dilated. There is right bowing of the interatrial septum, suggestive of elevated left atrial pressure  4. Right atrial size was severely dilated.  5. The mitral valve is normal in structure. Mild mitral valve regurgitation. No evidence of mitral stenosis.  6. The aortic valve has been repaired/replaced.     S/P Bentall procedure. There is a a 27mm Mitraflow bioprosthetic  valve in the aortic position. 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 There is a large lucent space surrounding the valve with  flow into the space by colorflow doppler consistent with valve dehiscence. Severely dilated aortic root at 59mm. Mild aortic stenosis is present. Aortic valve mean gradient measures 14.0 mmHg. Aortic valve Vmax measures 2.44 m/s.  7. Aortic dilatation noted. 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  8. The inferior vena cava is normal in size with greater than 50% respiratory variability, suggesting right atrial pressure of 3 mmHg.  9. Unable to compare to most recent echo of 02/2023 but compared to echo 12/2022, there appears to be significant  extension of the AVR dehiscence with significant flow into what appears to be a pseudoaneurysm of the aortic root extending into the graft from the prior Bentall procedure. Findings were discussed with Dr. Carolan Clines. FINDINGS  Left Ventricle: Left ventricular ejection fraction, by estimation, is 60 to 65%. The left ventricle has normal function. The left ventricle has no regional wall motion abnormalities. The left ventricular internal cavity size was normal in size. There is  moderate concentric left ventricular hypertrophy. Left ventricular diastolic parameters are consistent with Grade II diastolic dysfunction (pseudonormalization). Normal left ventricular filling pressure. Right Ventricle: The right ventricular size is normal. No increase in right ventricular wall thickness. Right ventricular systolic function is normal. There is normal pulmonary artery systolic pressure. The tricuspid regurgitant velocity is 1.75 m/s, and  with an assumed right atrial pressure of 3 mmHg, the estimated right ventricular systolic pressure is 15.2 mmHg. Left Atrium: Left atrial size was severely dilated. Right Atrium: Right atrial size was severely dilated. Pericardium: There is no evidence of  pericardial effusion. Mitral Valve: The mitral valve is normal in structure. Mild mitral annular calcification. Mild mitral valve regurgitation. No evidence of mitral valve stenosis. Tricuspid Valve: The tricuspid valve is normal in structure. Tricuspid valve regurgitation is trivial. No evidence of tricuspid stenosis. Aortic Valve: S/P Bentall procedure. There is a a 27mm Mitraflow bioprosthetic valve in the aortic position. 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 There is a large lucent space surrounding the valve with flow into the space by colorflow doppler consistent with valve dehiscence. Severely dilated aortic root at 59mm. The aortic valve has been repaired/replaced. Aortic valve regurgitation is not visualized. Mild aortic stenosis is present. Aortic  valve mean gradient measures 14.0 mmHg. Aortic valve peak gradient measures 23.8 mmHg. There is a 27 mm Mitraflow valve present in the aortic position. Pulmonic Valve: The pulmonic valve was normal in structure. Pulmonic valve regurgitation is trivial. No evidence of pulmonic stenosis. Aorta: Aortic dilatation noted. There is an aneurysm involving the aortic root measuring 59 mm. Venous: The inferior vena cava is normal in size with greater than 50% respiratory variability, suggesting right atrial pressure of 3 mmHg. IAS/Shunts: There is right bowing of the interatrial septum, suggestive of elevated left atrial pressure. No atrial level shunt detected by color flow Doppler.  LEFT VENTRICLE PLAX 2D LVIDd:         4.50 cm Diastology LVIDs:         3.10 cm LV e' medial:    6.99 cm/s LV PW:         1.40 cm LV E/e' medial:  11.5 LV IVS:        1.70 cm LV e' lateral:   12.90 cm/s                        LV E/e' lateral: 6.2  RIGHT VENTRICLE            IVC RV Basal diam:  3.50 cm    IVC diam: 1.80 cm RV S prime:     9.17 cm/s TAPSE (M-mode): 2.3 cm LEFT ATRIUM              Index        RIGHT ATRIUM            Index LA diam:        4.00 cm  2.12 cm/m   RA Area:  28.10 cm LA Vol (A2C):   109.0 ml 57.72 ml/m  RA Volume:   105.00 ml 55.60 ml/m LA Vol (A4C):   80.7 ml  42.73 ml/m LA Biplane Vol: 95.9 ml  50.78 ml/m  AORTIC VALVE AV Vmax:           244.00 cm/s AV Vmean:          171.000 cm/s AV VTI:            0.499 m AV Peak Grad:      23.8 mmHg AV Mean Grad:      14.0 mmHg LVOT Vmax:         135.50 cm/s LVOT Vmean:        78.050 cm/s LVOT VTI:          0.272 m LVOT/AV VTI ratio: 0.55 MITRAL VALVE               TRICUSPID VALVE MV Area (PHT): 3.12 cm    TR Peak grad:   12.2 mmHg MV Decel Time: 243 msec    TR Vmax:        175.00 cm/s MV E velocity: 80.10 cm/s MV A velocity: 61.20 cm/s  SHUNTS MV E/A ratio:  1.31        Systemic VTI: 0.27 m Armanda Magic MD Electronically signed by Armanda Magic MD Signature Date/Time: 04/15/2023/4:02:33 PM    Final    CT Angio Chest/Abd/Pel for Dissection W and/or Wo Contrast Result Date: 04/14/2023 CLINICAL DATA:  Chest pain radiating to the back. Acute aortic syndrome suspected. EXAM: CT ANGIOGRAPHY CHEST, ABDOMEN AND PELVIS TECHNIQUE: Non-contrast CT of the chest was initially obtained. Multidetector CT imaging through the chest, abdomen and pelvis was performed using the standard protocol during bolus administration of intravenous contrast. Multiplanar reconstructed images and MIPs were obtained and reviewed to evaluate the vascular anatomy. RADIATION DOSE REDUCTION: This exam was performed according to the departmental dose-optimization program which includes automated exposure control, adjustment of the mA and/or kV according to patient size and/or use of iterative reconstruction technique. CONTRAST:  75mL OMNIPAQUE IOHEXOL 350 MG/ML SOLN COMPARISON:  CT dated 03/02/2023. FINDINGS: Evaluation of this exam is limited due to respiratory motion and streak artifact caused by patient's arms. Evaluation is also limited due to loss of intra-abdominal fat and cachexia. CTA CHEST  FINDINGS Cardiovascular: Stable cardiomegaly. No pericardial effusion. Coronary vascular calcification. Mechanical aortic valve. Postsurgical changes of the repair of the ascending aorta and endovascular stent graft repair of the thoracic aorta similar to prior CT. The stent graft appears patent. No contrast noted outside of the confines of the stent graft. No periaortic fluid collection or hematoma. The origins of the great vessels of the aortic arch appear patent as visualized. The central pulmonary arteries appear patent. Mediastinum/Nodes: No hilar or mediastinal adenopathy. The esophagus is grossly unremarkable. No mediastinal fluid collection. Lungs/Pleura: Trace left pleural effusion. No focal consolidation, or pneumothorax. The central airways are patent. Musculoskeletal: No acute osseous pathology. Left anterior chest wall implantable cardiac device. Median sternotomy wires. Review of the MIP images confirms the above findings. CTA ABDOMEN AND PELVIS FINDINGS VASCULAR Aorta: Similar appearance of postsurgical changes of the upper abdominal aorta with irregularity of the wall. No aortic dissection. A 4 cm distal abdominal aortic aneurysm similar to prior CT. A 3.3 x 3.4 cm thrombosed aneurysm sac from the proximal abdominal aorta similar to prior CT. Celiac: Postsurgical changes adjacent to the celiac trunk. The celiac artery and its major branches appear patent.  SMA: The SMA is patent. Renals: The renal arteries appear patent. IMA: The IMA is patent. Inflow: Moderate atherosclerotic calcification. 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 No dissection. The iliac arteries are patent. Veins: The IVC is unremarkable.  No portal venous gas. Review of the MIP images confirms the above findings. NON-VASCULAR No intra-abdominal free air.  Small free fluid in the pelvis. Hepatobiliary: Small cyst in the dome of the liver. No biliary duct dilatation.  Small calcified gallstone. No pericholecystic fluid. Pancreas: The pancreas is unremarkable as visualized. Spleen: The spleen is unremarkable Adrenals/Urinary Tract: The adrenal glands are unremarkable. There is no hydronephrosis on either side. There is hypoenhancement of the left renal parenchyma suggestive of hypoperfusion. There is mild left renal parenchyma atrophy. There is no hydronephrosis on either side. The urinary bladder is grossly unremarkable. Stomach/Bowel: Evaluation of the bowel is limited in the absence of oral contrast and due to factors above. No bowel obstruction. The appendix is unremarkable as visualized. Lymphatic: No obvious adenopathy. Reproductive: The prostate and seminal vesicles are grossly remarkable. Other: Loss of subcutaneous fat and cachexia. Musculoskeletal: Degenerative changes of the spine. No acute osseous pathology. Review of the MIP images confirms the above findings. IMPRESSION: 1. No acute intrathoracic, abdominal, or pelvic pathology. No aortic dissection. 2. Stable postsurgical changes of the repair of the ascending aorta and endovascular stent graft repair of the thoracic aorta. 3. A 4 cm distal abdominal aortic aneurysm similar to prior CT. Recommend follow-up CT or MR as appropriate in 12 months and referral to or continued care with vascular specialist. (Ref.: J Vasc Surg. 2018; 67:2-77 and J Am Coll Radiol 2013;10(10):789-794.) 4. 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 5. Cholelithiasis. 6. Hypoperfusion of the left kidney.  No hydronephrosis. Electronically Signed   By: Elgie Collard M.D.   On: 04/14/2023 15:42   DG Chest 2 View Result Date: 04/14/2023 CLINICAL DATA:  Chest pain. EXAM: CHEST - 2 VIEW COMPARISON:  03/02/2023. FINDINGS: Bilateral lung fields are clear. Bilateral costophrenic angles are clear. Stable cardio-mediastinal silhouette. Sternotomy wires, loop recorder device and  vascular stents again noted. No acute osseous abnormalities. The soft tissues are within normal limits. IMPRESSION: No active cardiopulmonary disease. Electronically Signed   By: Jules Schick M.D.   On: 04/14/2023 11:40     Time coordinating discharge: Over 30 minutes    Lewie Chamber, MD  [facility] 04/19/2023, 12:52 PM

## 2023-04-19 NOTE — Progress Notes (Signed)
[facility] Progress Note   Subjective:    Seen and examined patient on HD. He didn't receive treatment on 2/1 due to staffing issues. Tolerating HD with UFG 3L so far. He is requesting his time be lowered to 2hrs. Given his heart issues, I recommend at least 3-3.5hrs today. Noted palliative care is on board. Per patient, he's supposed to be going home after HD today.  Objective Vitals:   04/19/23 0819 04/19/23 0823 04/19/23 1143 04/19/23 1155  BP: 107/86 107/86 99/72 94/70   Pulse: 83  77 70  Resp: 16 19 18 18   Temp: 98.7 F (37.1 C)  98 F (36.7 C)   TempSrc: Oral  Oral   SpO2: 97% 98% 99% 97%  Weight:   65.2 kg   Height:       Physical Exam General: Chronically ill appearing male in NAD Heart: SR on monitor.. RRR 2/6 systolic M.  Lungs: few scattered upper airway rhonci, no WOB Abdomen:NABS, NT Extremities:No LE edema Dialysis Access: L AVF + T/B  [facility] Weights   04/14/23 2300 04/16/23 0811 04/19/23 1143  Weight: 70 kg 65.8 kg 65.2 kg    Intake/Output Summary (Last 24 hours) at 04/19/2023 1229 Last data filed at 04/19/2023 1100 Gross per 24 hour  Intake 720 ml  Output 950 ml  Net -230 ml    Additional Objective Labs: Basic Metabolic Panel: Recent Labs  Lab 04/15/23 0550 04/15/23 1046 04/16/23 0938 04/19/23 1105  NA 135  --  133* 133*  K 3.5  --  3.7 4.5  CL 96*  --  96* 95*  CO2 25  --  23 22  GLUCOSE 88  --  76 96  BUN 43*  --  54* 60*  CREATININE 7.16*  --  8.34* 8.94*  CALCIUM 9.5  --  9.4 9.7  PHOS  --  5.5* 5.7* 7.3*   Liver Function Tests: Recent Labs  Lab 04/16/23 0938 04/19/23 1105  ALBUMIN 3.5 3.7   No results for input(s): "LIPASE", "AMYLASE" in the last 168 hours. CBC: Recent Labs  Lab 04/14/23 1020 04/15/23 0550 04/15/23 2359 04/16/23 0937 04/19/23 1105  WBC 6.1 5.6 5.0 5.0 4.7  HGB 13.6 12.7* 11.1* 11.2* 11.7*  HCT 42.9 39.6 33.8* 34.7* 35.8*  MCV 96.6 95.7 92.9 93.3 93.2  PLT 89* 78* 67* 65* 74*   Blood  Culture    Component Value Date/Time   SDES BLOOD RIGHT ANTECUBITAL 03/02/2023 0051   SPECREQUEST  03/02/2023 0051    BOTTLES DRAWN AEROBIC AND ANAEROBIC Blood Culture adequate volume   CULT  03/02/2023 0051    NO GROWTH 5 DAYS Performed at [facility], [address], [place], [place] [address]    REPTSTATUS 03/07/2023 FINAL 03/02/2023 0051    Cardiac Enzymes: No results for input(s): "CKTOTAL", "CKMB", "CKMBINDEX", "TROPONINI" in the last 168 hours. CBG: No results for input(s): "GLUCAP" in the last 168 hours. Iron Studies: No results for input(s): "IRON", "TIBC", "TRANSFERRIN", "FERRITIN" in the last 72 hours. Lab Results  Component Value Date   INR 1.1 04/15/2023   INR 1.2 03/02/2023   INR 1.2 01/17/2023   Studies/Results: No results found.  Medications:   amLODipine  10 mg Oral Daily   arformoterol  15 mcg Nebulization BID   And   umeclidinium bromide  1 puff Inhalation Daily   aspirin  81 mg Oral Daily   calcitRIOL  0.5 mcg Oral Q T,Th,Sa-HD   carvedilol  25 mg Oral BID WC  cephALEXin  1,000 mg Oral Daily   Chlorhexidine Gluconate Cloth  6 each Topical Q0600   Chlorhexidine Gluconate Cloth  6 each Topical Q0600   cloNIDine  0.2 mg Oral BID   clopidogrel  75 mg Oral Daily   heparin sodium (porcine)  3,000 Units Intravenous Once   hydrALAZINE  50 mg Oral TID   pantoprazole  40 mg Oral Daily   senna  1 tablet Oral Daily    Dialysis Orders: [facility]  TTS EDW 62.1  ,4hr, 2 k, 2 ca Hep 3000 units IV Mircera 100 mcg IV q 2 last 03/08/23  Cacitriol 0.5 mcg po q tx     Assessment/Plan: NSTEMI, HO CAD - Per primary. Cardiology following. Medical management. Poor prognosis. Palliative Care consulted for goals of care. Per Palliative's note from 2/2, patient agrees to be a DNR/DNI. He is not ready for hospice at this time and wishes to continue dialysis. Patient is to be discharged with home palliative care services. H/O aortic dissection-S/P AVR chronic  endocarditis-per primary ESRD -  T,Th,S HD today off schedule. Was supposed to have had HD 04/17/2023 to resume T,Th,S schedule but was bumped because of staffing issues. On HD today and again tomorrow per his routine schedule. He requested his HD time be lowered to 2hrs today but I recommend at least 3-3.5hrs given his current medical condition. Hypertension/volume  - no excess vol , bp stable  home meds amlodipine, carvedilol, clonidine, hydralazine  Anemia  - hgb 12.7  no esa needs  fo hgb trend  Metabolic bone disease -  Ca 9.5  , ad on phos ,not on binder with meals  fu labs , VDRA on hd  Ho CVA  meds per admit ASA On Plavix as above/On Repatha outpatient  COPD- inhaler meds  per admit / needs to dc smoking  Dispo- Per patient, plan for discharge after HD today which is reasonable. Can resume HD tomorrow in outpatient.    Salome Holmes, NP [facility] 04/19/2023,12:29 PM  LOS: 4 days

## 2023-04-19 NOTE — Progress Notes (Signed)
D/C order noted. Contacted [facility] to be advised of pt's d/c today and that pt should resume care tomorrow.   Olivia Canter Renal Navigator [phone]

## 2023-04-19 NOTE — Discharge Planning (Signed)
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 Not a candidate for valve replacement. Very poor prognosis. See below on GOC plan. Pain management deferred to Palliative Care. CAD - continue DAPT Hx of aortic dissection/prosthetic valve endocarditis - completed 6wk course IV Cefazolin. Now on PO Cephalexin indefinitely  Aranesp: Given: No   Last Hgb: 11.7 PRBC's Given: No ESA dose for discharge: N/A IV Iron dose at discharge: N/A  Heparin change: No  EDW Change: No   Bath Change: No  Access intervention/Change: No  Calcitriol change: No  Discharge Labs: Calcium 9.7 Phosphorus 7.3 Albumin 3.7 K+ 4.5  IV Antibiotics: Yes. 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 Now on PO Cephalexin indefinitely.  On Coumadin?: No, on DAPT   OTHER/APPTS/LAB ORDERS:  Please note: Patient seen by Palliative Care team here. Patient agreed to become a DNR/DNI and now set up with outpatient [facility] We need to get the DNR form signed at [facility]!    D/C Meds to be reconciled by nurse after every discharge.  Completed By: Salome Holmes, NP   Reviewed by: MD:______ RN_______

## 2023-04-19 NOTE — Progress Notes (Signed)
Pt called and complained of 10/10 pain, per MD orders 6 mg of po dilaudid  and has IV dilaudid for break through only. Pt stated he wanted the IV dilaudid instead of the po. I explained the orders to the patient he refused and stated " I would rather be in pain than take that"

## 2023-04-19 NOTE — Progress Notes (Signed)
Received patient in bed. Awake,alert and oriented x 4. Consent veriified.  Access used : Left upper arm AVF that worked well.  Duration of treatment : 4 hours.  Net UF : 1 liter  Hemo comment: Upon initiation of treatment,patient refused to have 4 hours treatment as prescribed.Renal NP made aware ,spoke patient at the bedside.  Tolerated treatment: Not tolerating UF  of 2 liters,thus a liter for  his net UF.  Hemo comment: Patient quit his treatment after 2 hours.He signed an AMA form .  Hand off to the patient's nurse : Back into his room with stable medical condition via transporter.

## 2023-04-19 NOTE — Progress Notes (Signed)
-   [facility] Liaison Note:  Notified by [facility] manager of patient/family request for [facility] Palliative services at home after discharge.   Please call with any hospice or outpatient palliative care related questions.   Thank you for the opportunity to participate in this patient's care.   Glenna Fellows, BSN, RN, OCN [employer] [phone]

## 2023-04-20 NOTE — TOC Transition Note (Signed)
Transition of Care - Initial Contact from Inpatient Facility  Date of discharge: 04/19/23 Date of contact: 04/20/23 Method: Phone Spoke to: Patient  Patient contacted to discuss transition of care from recent inpatient hospitalization. Patient was admitted to [facility] from 04/14/23-04/19/23 with discharge diagnosis of NSTEMI.  The discharge medication list was reviewed. Patient understands the changes and has no concerns.   Patient received HD today and next HD is on 04/22/23 at [facility].  Salome Holmes, NP

## 2023-05-04 IMAGING — CT CT HEAD W/O CM
4 series · 16 of 47 positions shown, 18 images · non-contrast
Comparison: Head CT 12/02/2017

CLINICAL DATA: Neuro deficit, acute, stroke suspected acute blurry
vision in right eye, hx CVA, no other deficits

EXAM:
CT HEAD WITHOUT CONTRAST
TECHNIQUE: Contiguous axial images were obtained from the base of the skull
through the vertex without intravenous contrast.

[Series 3: head without · axial · non-contrast · 0.45mm/px · z∈[-135,-15]mm · 7 of 32 slices shown, 9 images]
[im 4/32  brain]
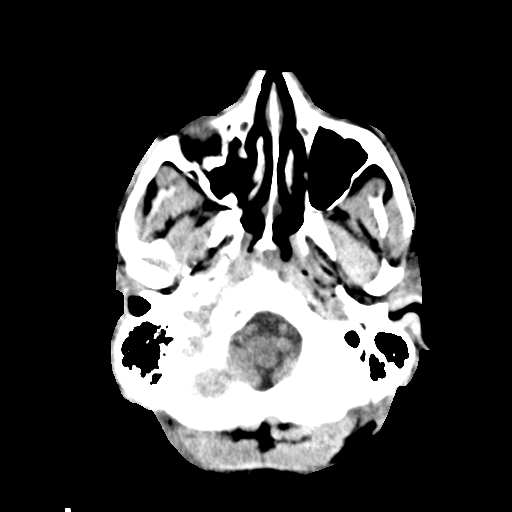
[im 4/32  bone]
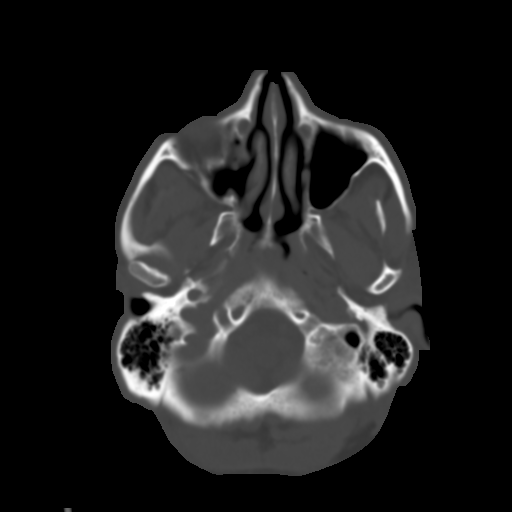
[im 8/32  brain]
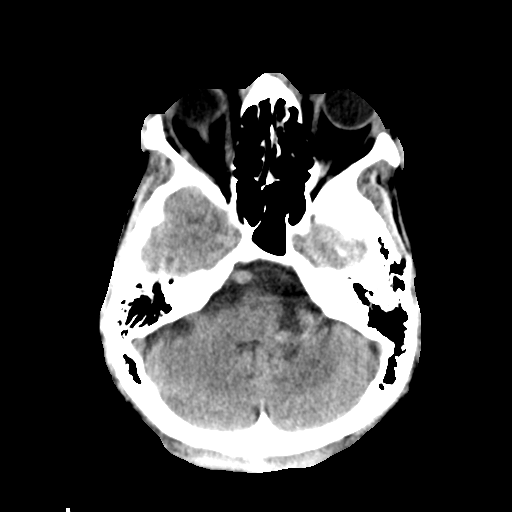
[im 12/32  brain]
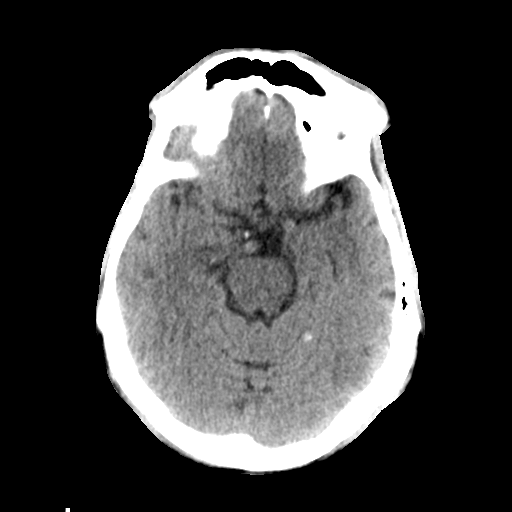
[im 16/32  brain]
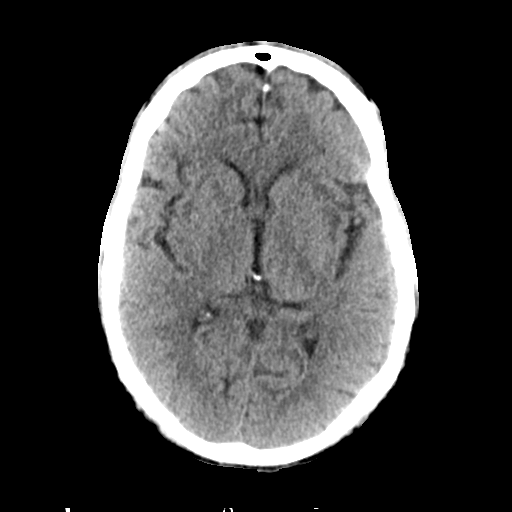
[im 20/32  brain]
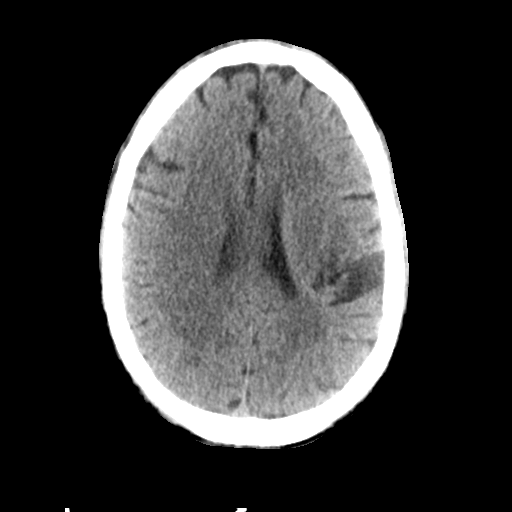
[im 20/32  bone]
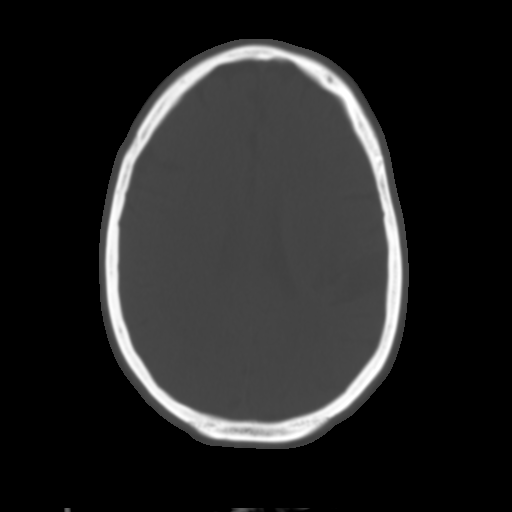
[im 24/32  brain]
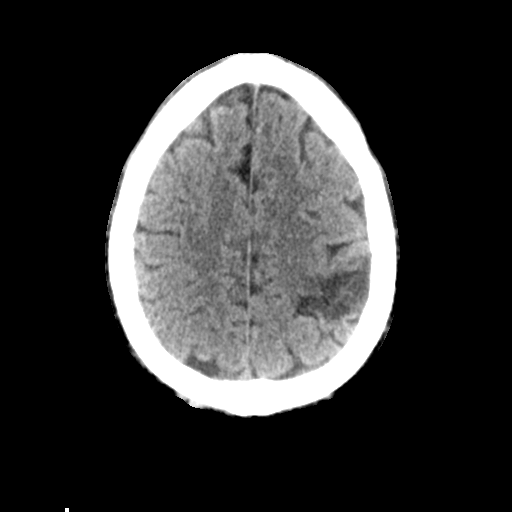
[im 28/32  brain]
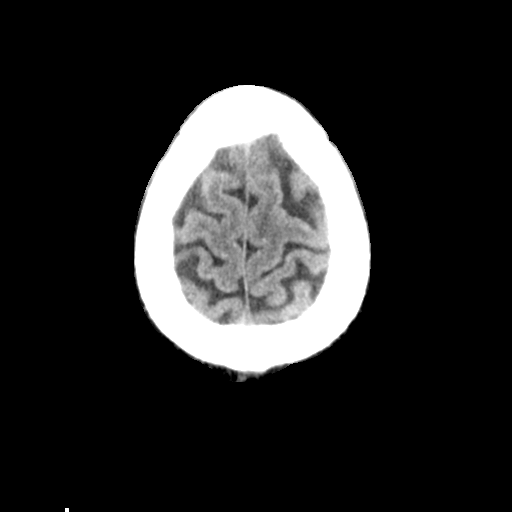

[Series 4: head bone · axial · 0.45mm/px · z∈[-136,-104]mm · 3 of 80 slices shown]
[im 8/80  bone]
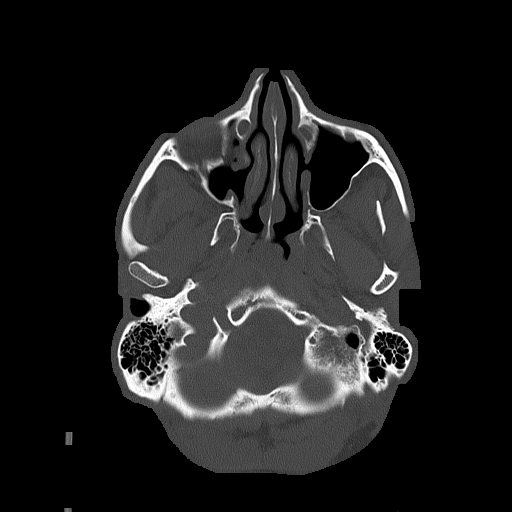
[im 16/80  bone]
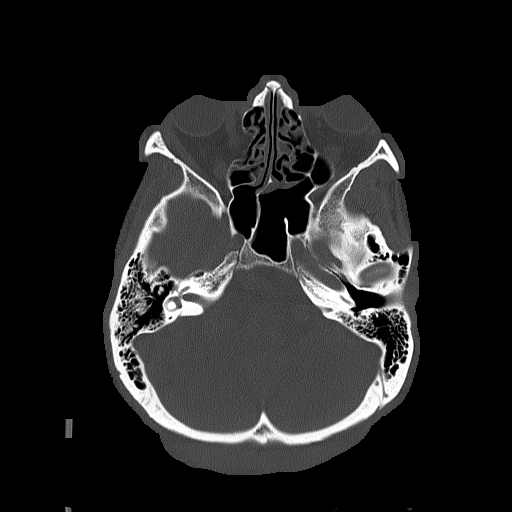
[im 24/80  bone]
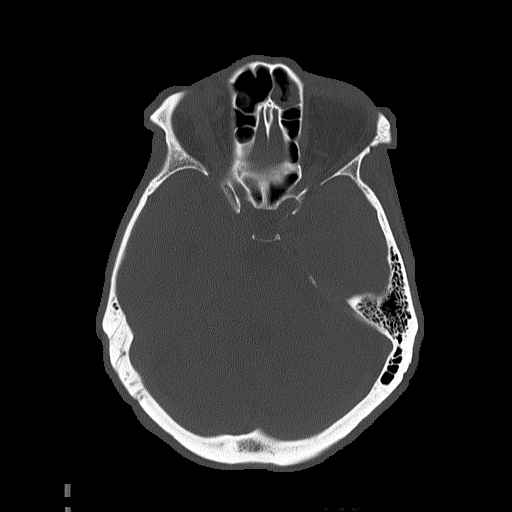

[Series 5: head without cor · coronal · non-contrast · 0.34mm/px · 3 of 77 slices shown]
[im 26/77  brain]
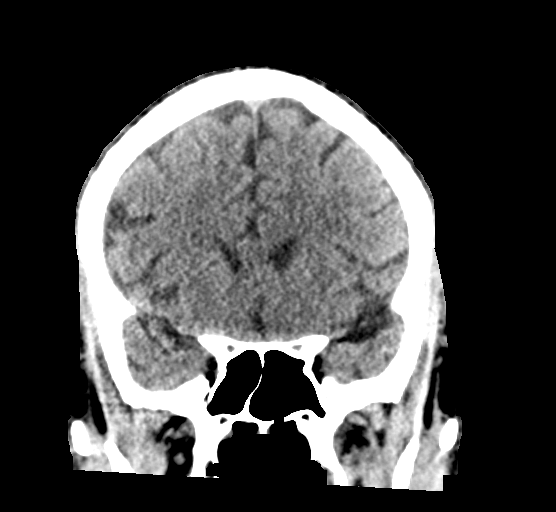
[im 34/77  brain]
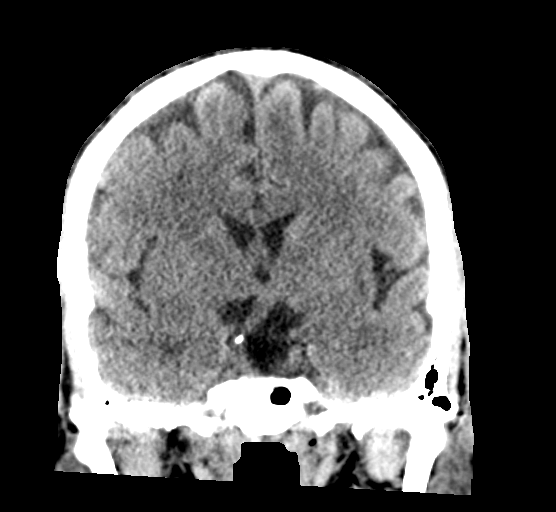
[im 43/77  brain]
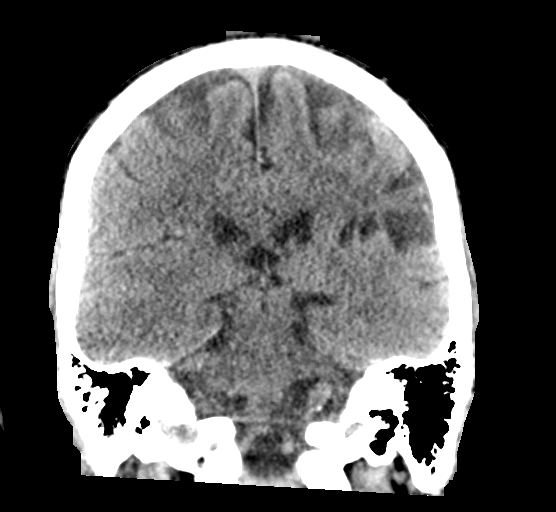

[Series 6: head without sag · sagittal · non-contrast · 0.35mm/px · 3 of 67 slices shown]
[im 23/67  brain]
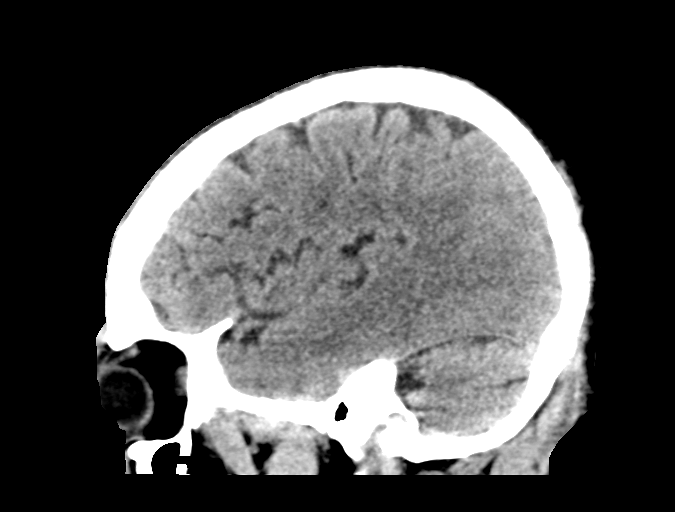
[im 34/67  brain]
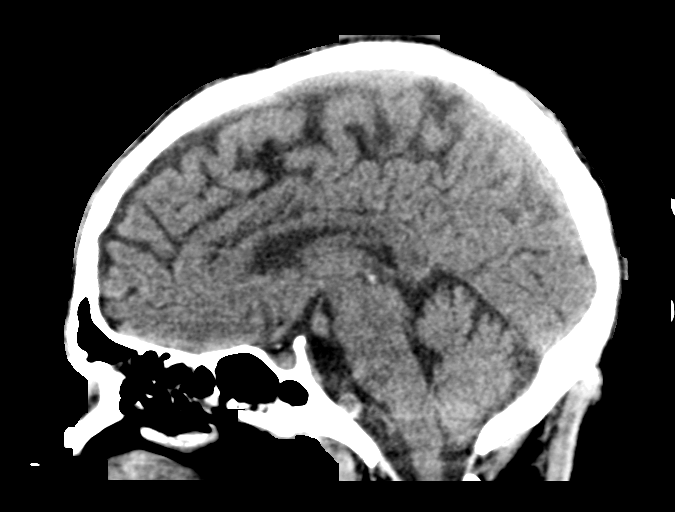
[im 45/67  brain]
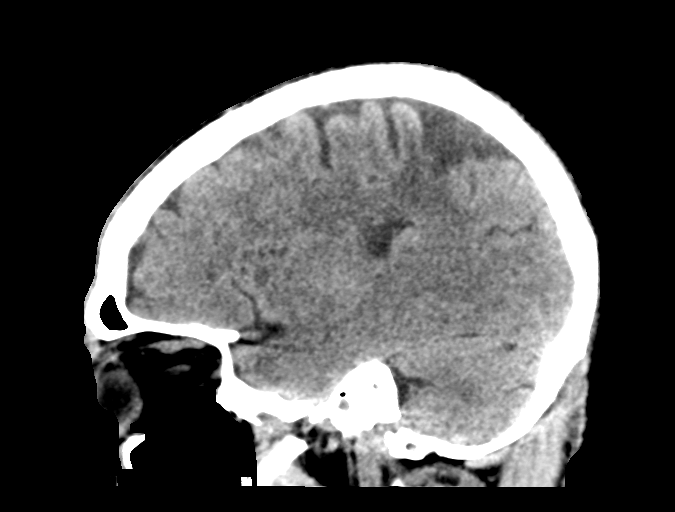

[16 of 47 positions shown; findings below may reference images not displayed]

FINDINGS: Brain: Remote left parietal infarct is unchanged from prior imaging.
No evidence of acute ischemia. Normal brain volume with minor
periventricular chronic small vessel ischemia. No intracranial
hemorrhage, mass effect, or midline shift. No hydrocephalus. The
basilar cisterns are patent. No extra-axial or intracranial fluid
collection.

Vascular: No hyperdense vessel.

Skull: No fracture or focal lesion.

Sinuses/Orbits: Mucosal thickening throughout the ethmoid air cells
and left frontal sinus. Mucous retention cysts in the maxillary
sinuses. Remote right orbital fracture. No acute orbital findings

Other: None.
IMPRESSION: 1. No acute intracranial abnormality.
2. Remote left parietal infarct.
3. Remote right orbital wall fracture.

## 2023-05-05 NOTE — Progress Notes (Incomplete)
Date:  05/05/2023   ID:  ELBIE STATZER, DOB [birth year], MRN [id number]   Provider location: [place] [facility] Type of Visit: Established patient   PCP:  Loura Back, NP  Cardiologist:  Dr. Shirlee Latch Nephrology: [facility]    HPI: [name] is a 53 y.o. 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  The descending thoracic aorta has dilated to 5.1 cm.  Cardiac catheterization in April 2016 demonstrated 60% first diagonal lesion.  He underwent aortic valve replacement with bovine pericardial tissue valve and reconstruction along with aortic root replacement and endovascular repair of the descending thoracic aortic aneurysm at [facility].  He has had intermittent chest discomfort since then.   Admitted in 12/02/2017 chest pain. Had NSTEMI. He had LHC with stent placed proximal ramus.  Repeat cath in 7/20 with nonobstructive disease.   He was admitted in 9/21 with atypical chest pain.  Echo showed EF 60-65% with severe LVH, normal RV, bioprosthetic aortic valve with mean gradient 18 mmHg.  Cardiolite showed no ischemia.   Echoes in 3/22 and 6/22 showed hyperdynamic LV with aortic valve mean gradient > 30. Therefore, TEE was done in 8/22 to assess the bioprosthetic aortic valve.  This showed EF 60-65%, severe LVH, normal RV, bioprosthetic AoV that opened with minimal restriction, mean gradient 20 mmHg in setting of anesthesia and BP control, no AI. Suspect small/undersized aortic valve.   Patient now has ESRD and is on dialysis.   LHC 11/22 showed small high 1st diagonal with  diffuse severe disease, similar to prior. Otherwise, nonobstructive disease.  S/p L AVF.   Admitted 10/24 with CP. BCx + for staph epidermidis. Treated with cefazolin. TEE did not show valvular vegetation. ID planned for 6 weeks IV abx.  Re-admitted 01/16/23 CP. HsTroponins elevated, nuclear stress test was inconclusive so underwent LHC, showing no significant CAD. Pain felt to be 2/2 viral pericarditis as Coxsackie serologies +. Started on low-dose colchicine, prednisone and remained on ASA.  Today he returns for post hospital HF follow up. Overall feeling fine. Main complaint is palpitations, worse with activity. Symptoms improve with application of IcyHot to chest and taking Percocet. He is not SOB with activity. Continues with CP, waxes and wanes throughout the day. Denies dizziness, edema, or PND/Orthopnea. Appetite ok. No fever or chills. Taking all medications. He makes urine. Receiving IV abx at dialysis, dialyzes T/Th/Sat. Smokes THC daily (helps with pain), smokes 2-3 cigs/day, no ETOH. He has 14 grandchildren.  ECG (personally reviewed): NSR 97 bpm  Labs (10/19): K 4.1, creatinine 1.78, myeloma panel negative Labs (2/20): K 4.2, creatinine 1.75, LDL 128 Labs (7/21): LDL 66 Labs (9/21): K 4.1, creatinine 3.31, hgb 11.9 Labs (10/21): K 4, creatinine 2.96, hgb 12.8 Labs (3/22): LDL 55 Labs (10/22): hgb 10.6 Labs (11/22): K 3.8, hgb 10.6 Labs (3/23): BNP 395, hgb 9.4, LDL 47, HDL 48 Labs (2/24): K 4.1, creatinine 7.4 Labs (11/24): K 4.4, creatinine 5.96, Lp(a)  231  Review of Systems: All systems reviewed and negative except as per HPI.    PMH: 1.  H/o ankle fractures bilaterally 2. GERD 3. HTN: Negative urinary catecholeamine collection for pheochromocytoma.  - Renal artery dopplers (3/22): Not suggestive of significant renal artery stenosis.  4. ESRD: Suspect hypertensive nephropathy.  5. Cardiomyopathy: Most likely due to heavy ETOH ingestion and uncontrolled HTN.   Echo (3/13)  with EF 35-40%, diffuse HK, mild LVH, moderate to severe eccentric MR, mild to moderate AI with left coronary cusp prolapse, PA systolic pressure 38 mmHg.  Lexiscan myoview (4/13): EF 37%, global hypokinesis, no ischemic or infarction.  Echo (12/14) with EF 45-50%, diffuse hypokinesis, moderate LVH, mild MR, moderate AI.  Echo (4/16) with EF 55-60%, severe LVH, mild LV dilation, moderate AI and moderate MR.   - Echo (9/19): EF 35-40% with diffuse hypokinesis, severe LVH, bioprosthetic aortic valve functioning normally.  - PYP scan (1/20): No evidence for TTR amyloidosis.  - Echo (9/21): EF 60-65%, severe LVH, normal RV, bioprosthetic aortic valve with mean gradient 18 mmHg.  - TEE (8/22): EF 60-65%, severe LVH, normal RV, bioprosthetic AoV that opened with minimal restriction, mean gradient 20 mmHg in setting of anesthesia and BP control, no AI.  Suspect small/undersized aortic valve. - Echo (10/24): EF 55-60%, G2DD, normal RV, moderate MR, Bioprosthetic AoV. Vmax 3.5 m/s, MG 31 mmHG, EOA 0.90 cm2, DI 0.26, AT 120 msec. 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  - TEE (10/24): LVEF 65%, severe concentric LVH, normal RV, moderate MR, moderate thickening of AoV prosthesis, trivial AI with at least moderate degeneration of AoV with a least moderate stenosis 6. ETOH abuse: Quit 3/13.  7. Active smoker. 8. Valvular heart disease: Echo in 3/13 showed moderate to severe eccentric MR and mild to moderate AI with prolapsing left coronary cusp. Echo 12/14 with mild MR and moderate AI.  Echo 4/16 with moderate AI and moderate MR.  - S/p Bentall procedure with bioprosthetic aortic valve 9/16.  - Echoes in 3/22 and 6/22 showed hyperdynamic LV with bioprosthetic aortic valve with mean gradient > 30.  - TEE (8/22): EF 60-65%, severe LVH, normal RV, bioprosthetic AoV that opened with minimal restriction, mean gradient 20 mmHg in setting of anesthesia and BP control, no AI.  Suspect  small/undersized aortic valve. - TEE (10/24): LVEF 65%, severe concentric LVH, normal RV, moderate MR, moderate thickening of AoV prosthesis, trivial AI with at least moderate degeneration of AoV with a least moderate stenosis. 9.  CVA: Has ILR. 10. Type B aortic dissection: Occurred in 2/16, from just distal to the left subclavian.  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    - 10/03/2014 First stage median sternotomy with right axillary cannulation for aortic valved conduit root replacement with coronary reconstruction (button Bentall Procedure), 27 mm Sorin Mitroflow bovine pericardial valved conduit, total arch replacement (24mm Vascutek [place] multibranch arch graft with individual head vessel reimplantation. - Status post open repair of an Extent III TAA secondary to chronic dissection with a # 22mm Vascutek "Coselli" multibranch Dacron graft with individual celiac, SMA, and bilateral renal artery implantation on 01/04/2017, [facility].  11. CAD: LHC (4/16) with 60% ostial D1.   - NSTEMI 9/19: LHC (9/19) showed 50% proximal LCx, 90% ramus => DES to ramus.  - LHC (7/20): D1 60% stenosis, patent ramus stent.  - Cardiolite (9/21): Inferior fixed defect, no ischemia.  - LHC (11/22): D1 95% stenosis, patient ramus stent. No interventional targets. - Myoview (11/24): Intermediate risk, no evidence of ischemia, medium defect defect with moderate reduction in uptake present in  the mid to basal inferolateral location(s) that is fixed. Consistent with infarction.  - LHC (11/24): chronically occluded small diagonal, patent stent in the Ramus intermediate, markedly aneurysmal cors; nonobstructive CAD, no change in angiography compared to prior study. 12. Sleep study (10/21) with no significant OSA.   Current Outpatient Medications  Medication Sig Dispense Refill   acetaminophen (TYLENOL) 500 MG tablet Take 1,000 mg by mouth every 6 (six) hours as needed for mild pain (pain score 1-3),  moderate pain (pain score 4-6) or headache.     albuterol (VENTOLIN HFA) 108 (90 Base) MCG/ACT inhaler Inhale 1 puff into the lungs every 6 (six) hours as needed for wheezing or shortness of breath.     amLODipine (NORVASC) 10 MG tablet TAKE 1 TABLET(10 MG) BY MOUTH DAILY 90 tablet 3   aspirin EC 81 MG EC tablet Take 1 tablet (81 mg total) by mouth daily. 90 tablet 3   carvedilol (COREG) 12.5 MG tablet Take 1 tablet (12.5 mg total) by mouth 2 (two) times daily. 180 tablet 3   cephALEXin (KEFLEX) 500 MG capsule Take 2 capsules (1,000 mg total) by mouth daily. Daily at night time, start from 12/9 after completion of IV course. 60 capsule 1   cloNIDine (CATAPRES) 0.2 MG tablet Take 0.2 mg by mouth 2 (two) times daily.     Evolocumab (REPATHA SURECLICK) 140 MG/ML SOAJ Inject 140 mg into the skin every 14 (fourteen) days. 6 mL 3   hydrALAZINE (APRESOLINE) 100 MG tablet Take 0.5 tablets (50 mg total) by mouth 3 (three) times daily.     HYDROmorphone (DILAUDID) 4 MG tablet Take 1.5 tablets (6 mg total) by mouth every 3 (three) hours as needed for severe pain (pain score 7-10) or moderate pain (pain score 4-6). 60 tablet 0   lidocaine 4 % Place 1 patch onto the skin daily. Apply to lower back     lidocaine-prilocaine (EMLA) cream Apply 1 application. topically as needed (prior to port being accessed). Use on dialysis days (Tues, Thurs and Sat)     meclizine (ANTIVERT) 25 MG tablet Take 1 tablet (25 mg total) by mouth 3 (three) times daily as needed for dizziness. 30 tablet 0   nitroGLYCERIN (NITROSTAT) 0.4 MG SL tablet Place 1 tablet (0.4 mg total) under the tongue every 5 (five) minutes as needed for chest pain. 25 tablet 0   pantoprazole (PROTONIX) 40 MG tablet Take 1 tablet (40 mg total) by mouth daily. 30 tablet 0   polyethylene glycol powder (GLYCOLAX/MIRALAX) 17 GM/SCOOP powder Take 17 g by mouth daily. (Patient taking differently: Take 17 g by mouth daily as needed for moderate constipation.) 238 g 0    senna (SENOKOT) 8.6 MG TABS tablet Take 1 tablet (8.6 mg total) by mouth daily. 90 tablet 1   STIOLTO RESPIMAT 2.5-2.5 MCG/ACT AERS Inhale 2 puffs into the lungs at bedtime.     No current facility-administered medications for this visit.    Allergies:   Clonidine derivatives, Imdur [isosorbide nitrate], Tramadol, and Clonidine   Social History:  The patient  reports that he has been smoking cigarettes. He has a 5.8 pack-year smoking history. He has been exposed to tobacco smoke. He has never used smokeless tobacco. He reports that he does not currently use alcohol after a past usage of about 1.0 standard drink of alcohol per week. He reports current drug use. Drug: Marijuana.   Family History:  The patient's family history includes Colon cancer in his paternal uncle; Diabetes in  his maternal aunt; Headache in his sister; Heart attack in his brother; Hypertension in his brother, mother, and another family member; Lung cancer in his maternal uncle; Other in his sister; Stroke in his maternal aunt and maternal uncle; Thyroid disease in his sister.   ROS:  Please see the history of present illness.   All other systems are personally reviewed and negative.   Recent Labs: 03/02/2023: ALT 6; B Natriuretic Peptide >4,500.0 04/14/2023: Magnesium 2.2 04/19/2023: BUN 60; Creatinine, Ser 8.94; Hemoglobin 11.7; Platelets 74; Potassium 4.5; Sodium 133  Personally reviewed   Wt Readings from Last 3 Encounters:  04/19/23 65.2 kg (143 lb 11.8 oz)  03/04/23 62.1 kg (136 lb 14.5 oz)  02/17/23 63 kg (139 lb)   There were no vitals taken for this visit.  PHYSICAL EXAM General:  NAD. No resp difficulty, walked into clinic HEENT: Normal Neck: Supple. No JVD. Carotids 2+ bilat; no bruits. No lymphadenopathy or thryomegaly appreciated. Cor: PMI nondisplaced. Regular rate & rhythm. No rubs, gallops, 2/6 SEM RUSB  Lungs: Clear Abdomen: Soft, nontender, nondistended. No hepatosplenomegaly. No bruits or masses.  Good bowel sounds. Extremities: No cyanosis, clubbing, rash, edema; L AVF +/+ Neuro: Alert & oriented x 3, cranial nerves grossly intact. Moves all 4 extremities w/o difficulty. Affect pleasant.   ASSESSMENT AND PLAN:  1. CAD: 9/19 NSTEMI with PCI to proximal ramus.  LHC in 7/20 with intervenable disease.  Admission in 9/21 with Cardiolite showing no ischemia.  He has had long-standing atypical chest pain.  Last cath was in 11/22, showing small high 1st diagonal with diffuse severe disease, similar to prior, patent ramus stent. Otherwise, nonobstructive disease. LHC (11/24) showed chronically occluded small diagonal, patent stent in the Ramus intermediate, markedly aneurysmal cors; nonobstructive CAD, no change in angiography compared to prior study. He has a history of chronic chest pain. Currently being treated for pericarditis (see below).   Unable to tolerate long-acting nitrate. - Continue ASA 81.  - Continue atorvastatin and Repatha.   Having a hard time getting Repatha. Will notify Lipid Pharm D.  - Increase Coreg to 12.5 mg bid  2. 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 Myeloma panel negative and PYP scan negative, suspect due more likely to HTN than hypertrophic cardiomyopathy. Cannot get cardiac MRI with elevated creatinine. Echo (1/24) normal EF, AV gradient 20 mm hg consistent with previous.  Echo (10/24): EF 55-60%, G2DD, normal RV, moderate MR, Bioprosthetic AoV. Vmax 3.5 m/s, MG 31 mmHG, EOA 0.90 cm2, DI 0.26, AT 120 msec. NYHA II, volume managed by iHD (previously once a week, now 3x/week as he is getting IV abx).  - Increase Coreg to 12.5 mg bid 3. HTN:  Renal artery dopplers in 3/22 were not suggestive of significant renal artery stenosis.  Sleep study in 10/21 was negative. - BP up a bit today, but has not had AM meds yet. - Increase Coreg as above. - Continue hydralazine 50 mg tid.  - Continue  amlodipine 10 mg daily.  4. S/P Bioprothetic AVR: Echoes in 3/22 and 6/22 showed hyperdynamic LV with mean aortic valve gradient > 30.  TEE was done to investigate.  This showed bioprosthetic AoV that opened with minimal restriction, mean gradient 20 mmHg in setting of anesthesia and BP control, no AI.  Suspect small/undersized aortic valve (patient-prosthesis mismatch). Echo (1/24): normal EF. AV gradient 20 mm hg consistent with previous.  Echo (10/24): bioprosthetic AoV Vmax  3.5 m/s, MG 31 mmHG, EOA 0.90cm2, DI 0.26, AT 120 msec. 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  TEE (10/24): LVEF 65%, moderate aortic stenosis.  - ? If subacute valve thrombosis responsible. He is not on anticoagulation. - Discussed with Dr. Shirlee Latch. Considered arranging for a coronary CTA to evaluate for subacute valve thrombosis. However, discussed case with Structural Heart PA, and his valve is 51 years old, and there is a high probability it is degenerative. Will refer to Structural heart team. 5. S/P 2016 and 2018 thoracic aortic aneurysm repair: Followed by Dr. Kizzie Bane at [facility].  CTA at [facility] in 3/23 showed stable repaired thoracic aorta. Aortic root repair 38 mm on echo (10/24) - Continue followup screening at [facility]  6. ESRD: Think this was due to controlled HTN. Previously on HD weekly, now 3x/week due to need for IV abx for MSSA bacteremia. 7. Active smoker: Has cut back to 2-3 cigarettes/day. - Discussed smoking cessation.  8. Erectile dysfunction:  prn Viagra but was counseled to avoid NTG when taking Viagra.  9. Palpitations: Continues with palpitations. Had ZIO placed 7/24, but no results. He says he mailed his patch back.  - Sleep study negative for OSA - Repeat 2 week Zio 10. Pericarditis: On prednisone taper, colchicine and ASA. 11. 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 TEE without vegetation. MRI lumbar spine w/p evidence of infection. On IV cefazolin (administered during dialysis  sessions) until 02/22/23  Follow up in 6 months with Dr. Shirlee Latch.   Signed, Jacklynn Ganong, FNP  05/05/2023  [facility] [address] [facility] [place] [address] [phone] (office) [phone] (fax)

## 2023-05-07 ENCOUNTER — Encounter (HOSPITAL_COMMUNITY): Payer: 59

## 2023-05-08 IMAGING — DX DG CHEST 1V PORT
1 series · 1 of 1 positions shown · non-contrast
Comparison: 09/02/2020

CLINICAL DATA: Dialysis catheter insertion

EXAM:
PORTABLE CHEST 1 VIEW

[chest ap]
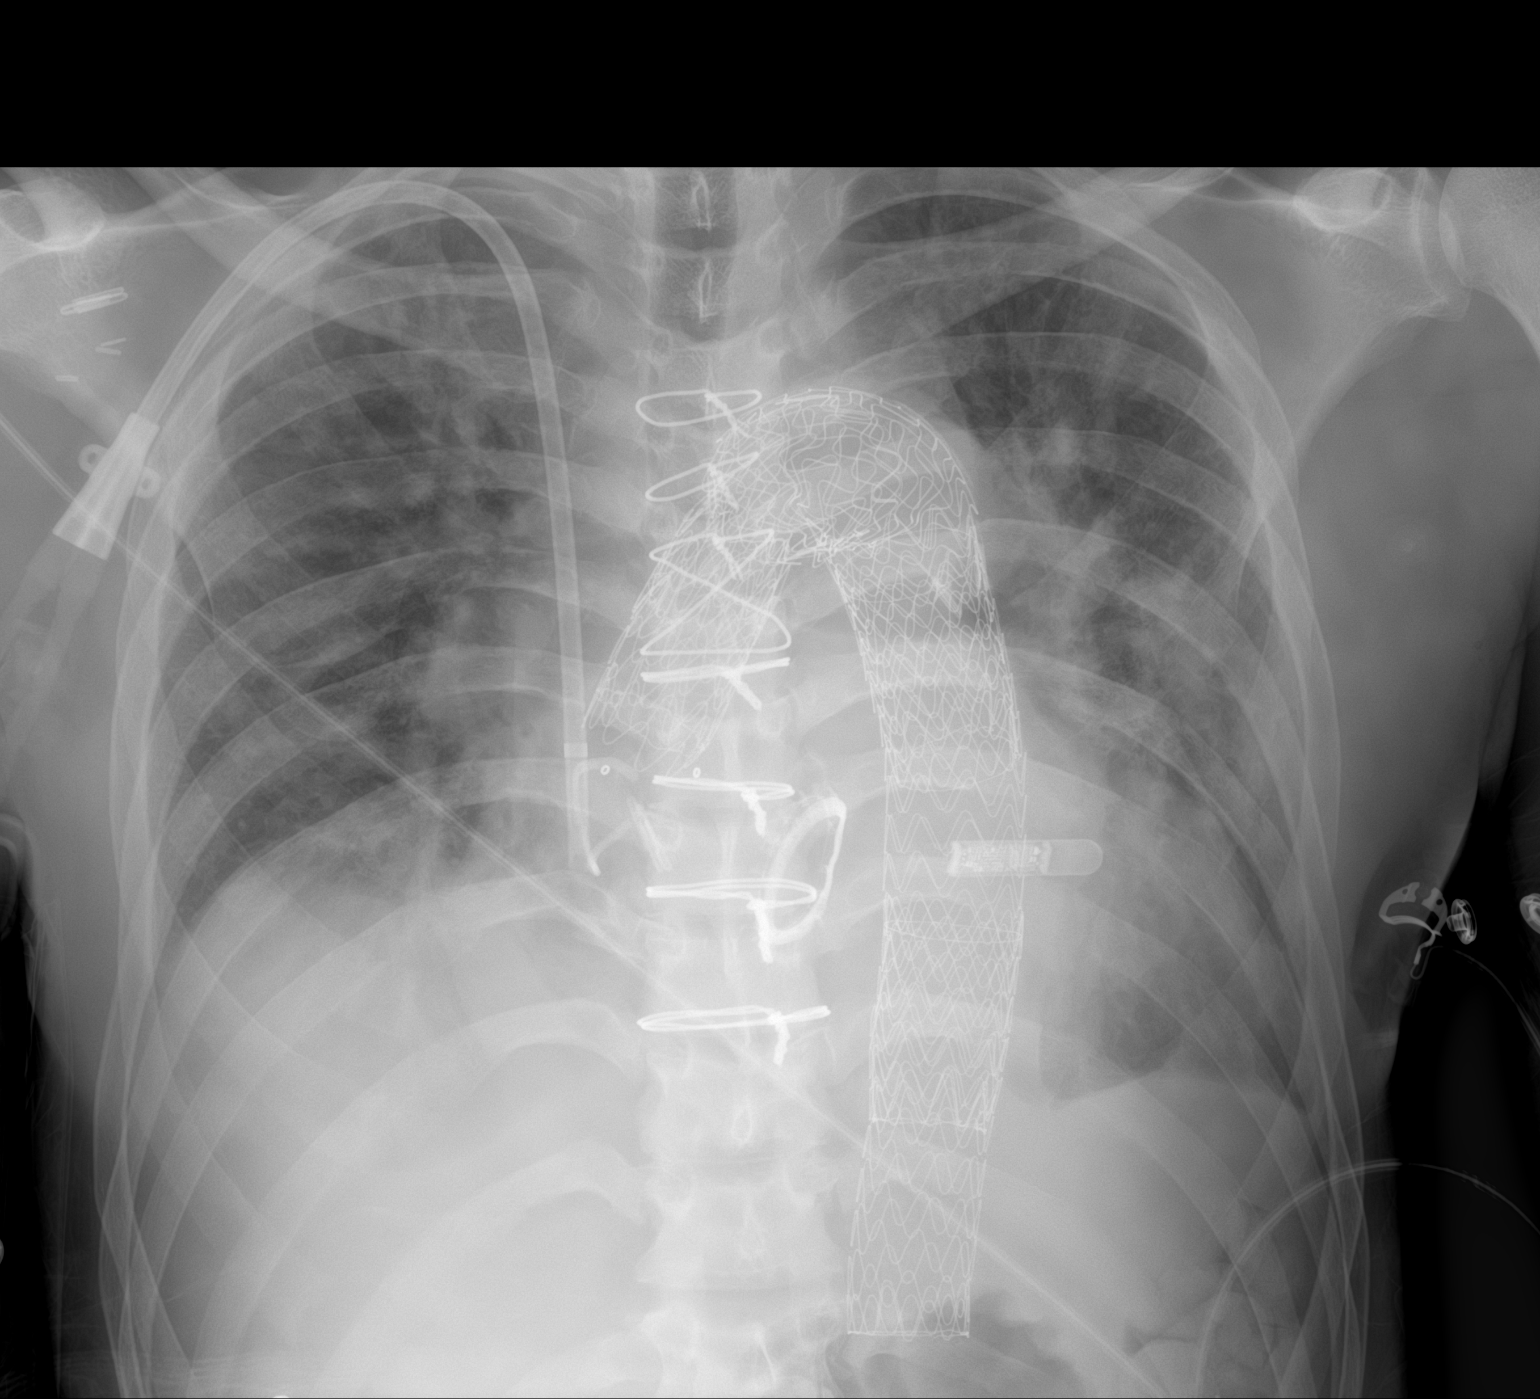

[1 of 1 positions shown; findings below may reference images not displayed]

FINDINGS: Interval placement of right jugular central venous catheter tip in
the right atrium. No pneumothorax.

Aortic valve replacement. Aortic stent graft unchanged. Cardiac loop
recorder unchanged.

Interval development of diffuse bilateral airspace disease most
likely edema. Mild bibasilar atelectasis
IMPRESSION: Central line placement to the right atrium.  No pneumothorax

Interval development of diffuse bilateral airspace disease likely
pulmonary edema.

## 2023-06-15 DEATH — deceased

## 2023-12-18 IMAGING — DX DG CHEST 2V
2 series · 2 of 2 positions shown · non-contrast
Comparison: 01/11/2021

CLINICAL DATA: Chest pain

EXAM:
CHEST - 2 VIEW

[chest pa]
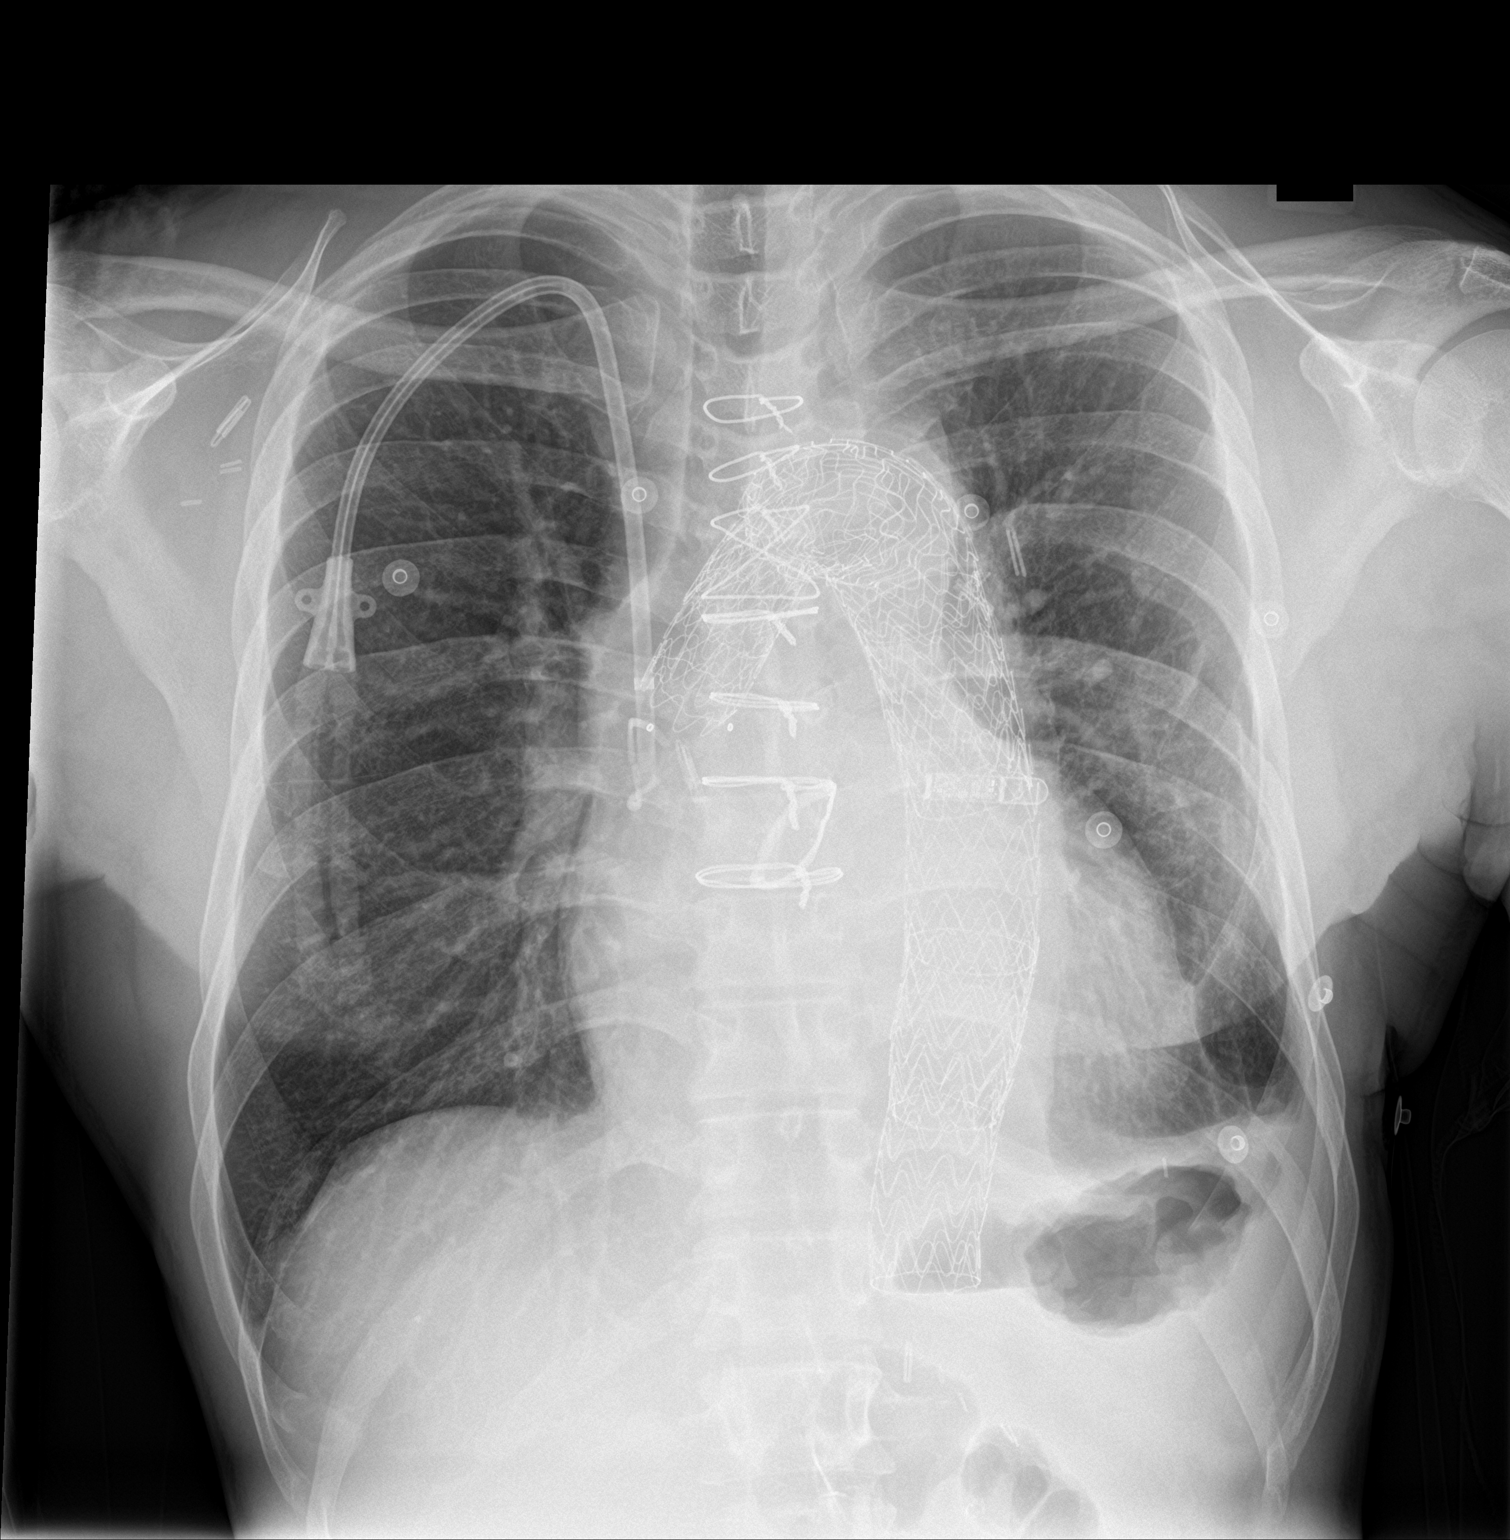

[chest lat]
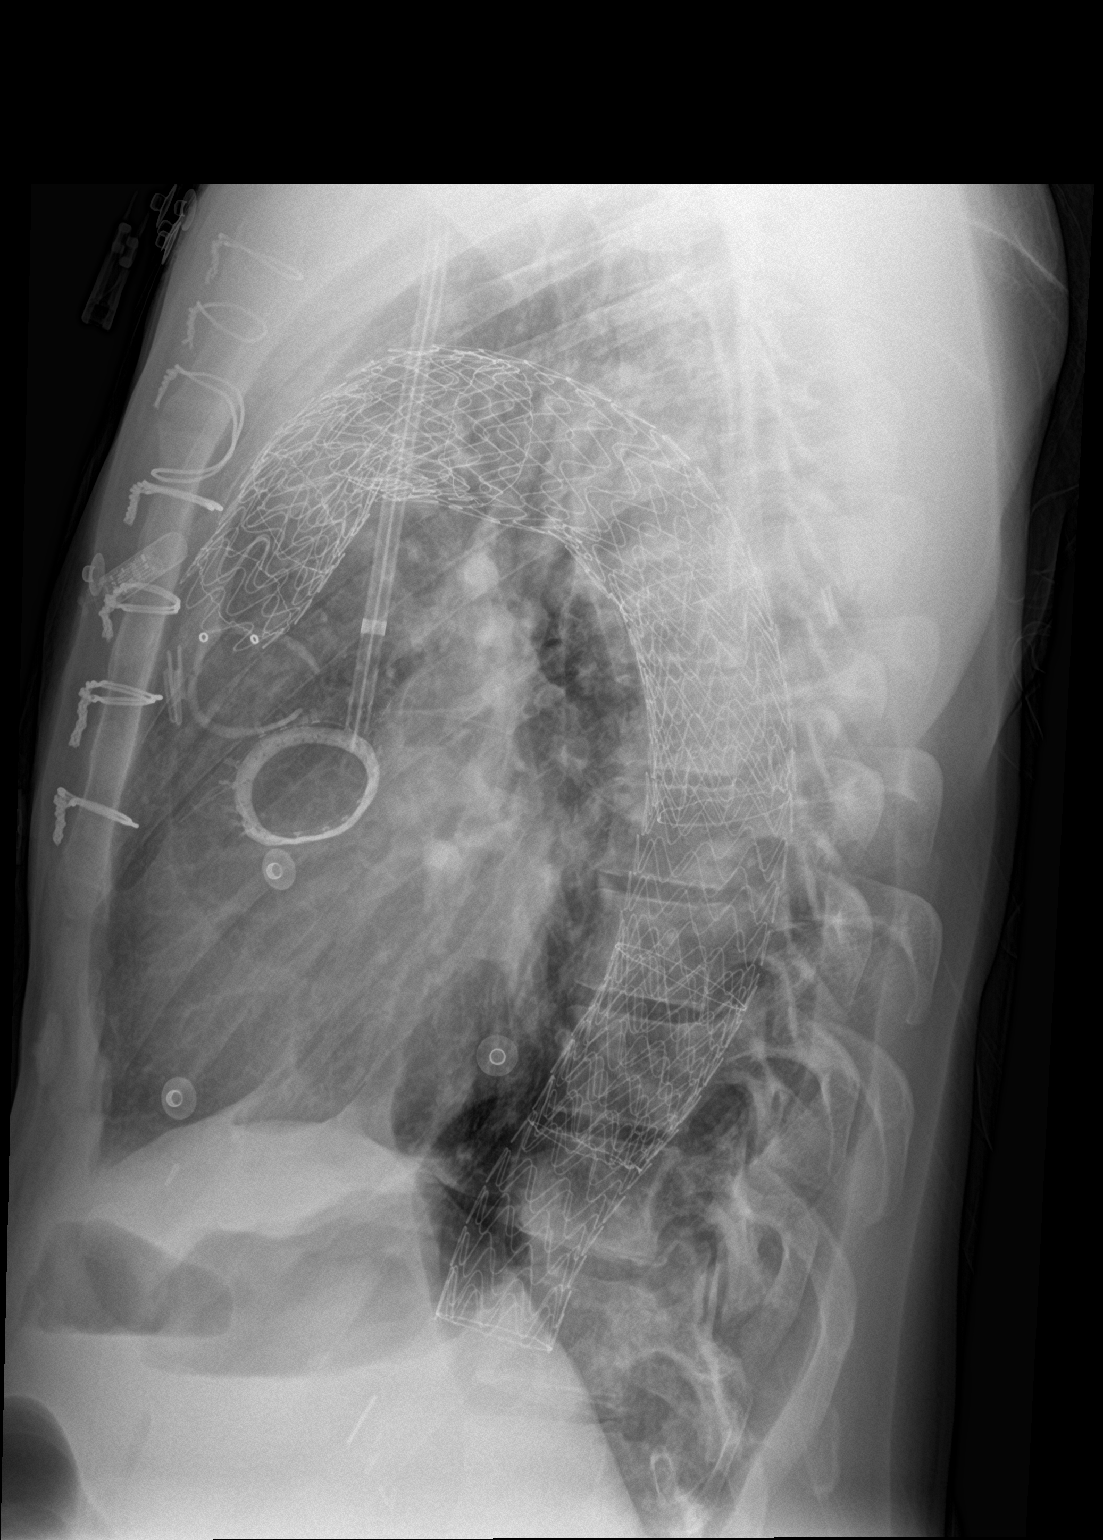

[2 of 2 positions shown; findings below may reference images not displayed]

FINDINGS: The lungs are well expanded. Mild left basilar pleural thickening
and parenchymal scarring is unchanged with associated mild
left-sided volume loss. No superimposed focal pulmonary infiltrate.
No pneumothorax or pleural effusion. Right internal jugular
hemodialysis catheter tip noted at the superior cavoatrial junction.
Cardiac size is mildly enlarged. Endovascular stent grafting of the
ascending and descending thoracic aorta has been performed. Hmsa Ezat
valve replacement has been performed. Implanted loop recorder noted.
Pulmonary vascularity is normal. No acute bone abnormality.
IMPRESSION: No radiographic evidence of acute cardiopulmonary disease.

Stable cardiomegaly.

## 2024-01-23 IMAGING — DX DG CHEST 1V PORT
1 series · 1 of 1 positions shown · non-contrast
Comparison: Two-view chest x-ray 04/21/2021

CLINICAL DATA: Left-sided chest pain.  Shortness of breath.

EXAM:
PORTABLE CHEST 1 VIEW

[chest ap]
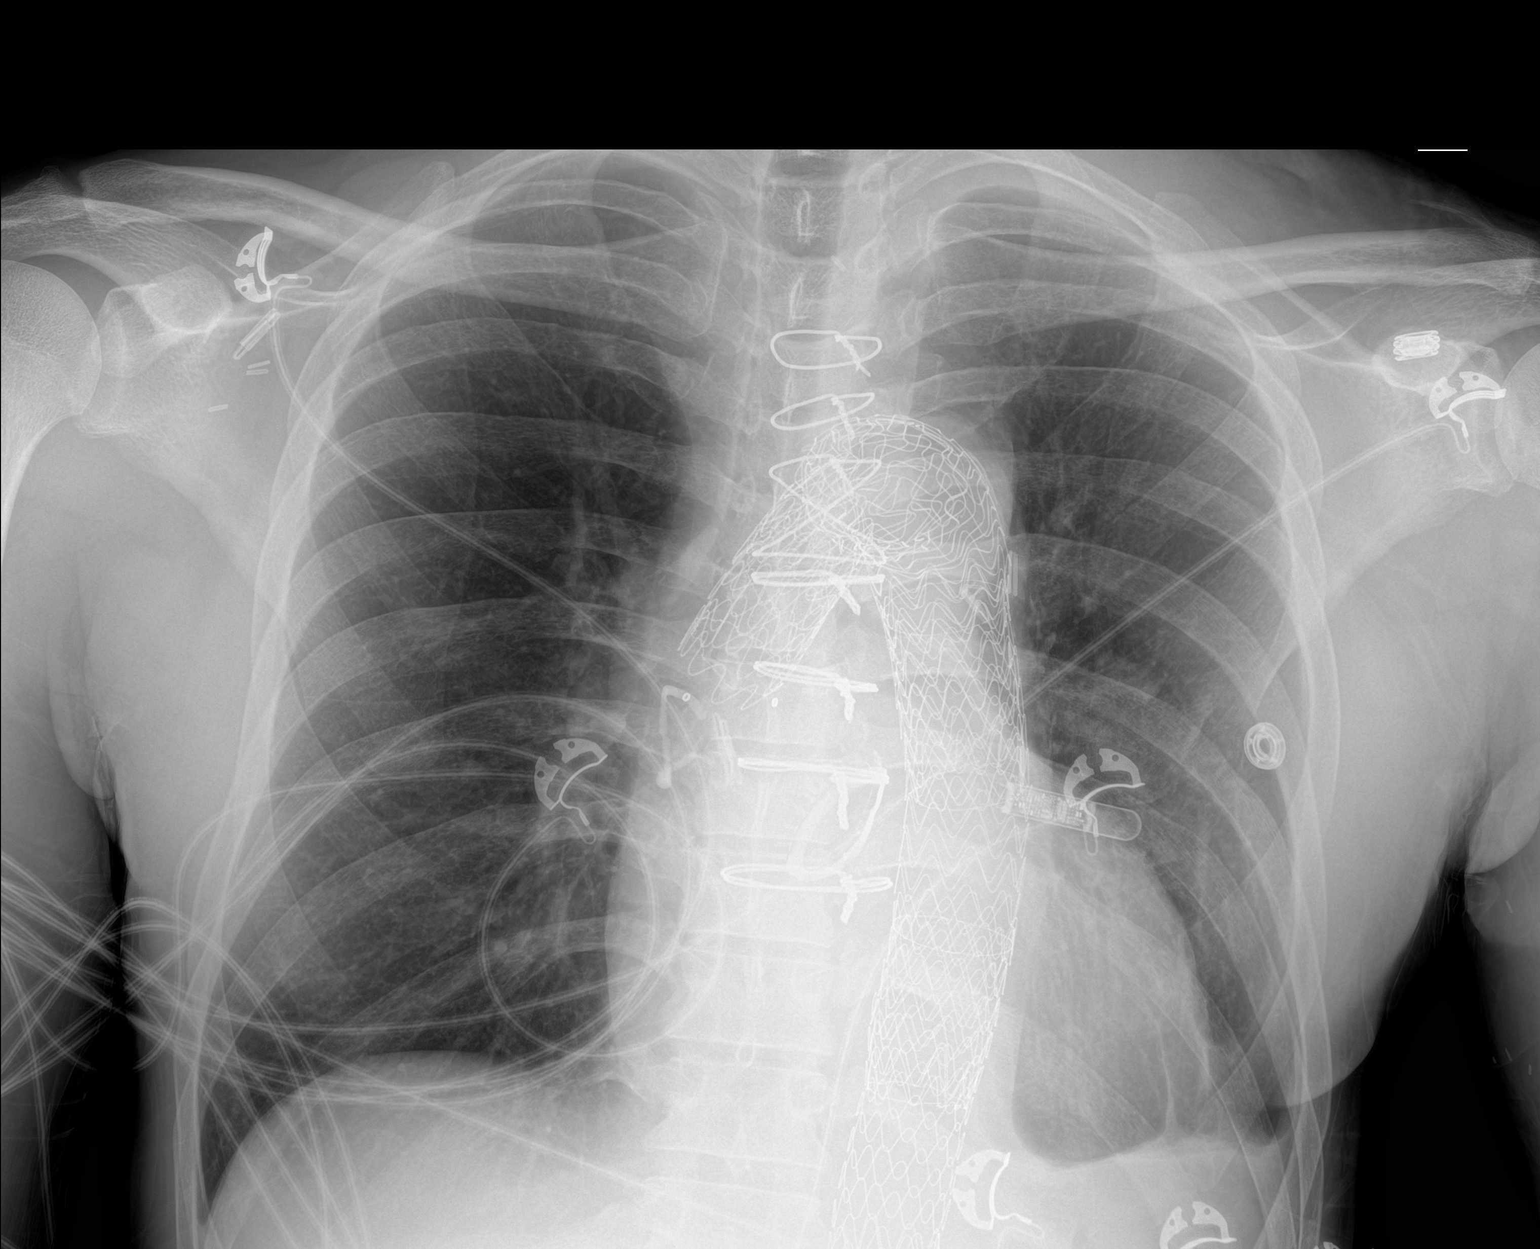

[1 of 1 positions shown; findings below may reference images not displayed]

FINDINGS: Aortic stent graft repair stable. Small left pleural effusion
similar the prior study. Minimal atelectasis or scarring associated.
The right lung is clear.
IMPRESSION: 1. Stable small left pleural effusion and associated atelectasis or
scarring.
2. No acute cardiopulmonary disease.

## 2024-01-23 IMAGING — US US ABDOMEN LIMITED
1 series · 14 of 25 positions shown · non-contrast
Comparison: CTA chest abdomen and pelvis of earlier today.

CLINICAL DATA: Gallstone.

EXAM:
ULTRASOUND ABDOMEN LIMITED RIGHT UPPER QUADRANT

[Series 1: us abdomen limited ruq (liver/gb) · 14 of 71 slices shown]
[im 1/71]
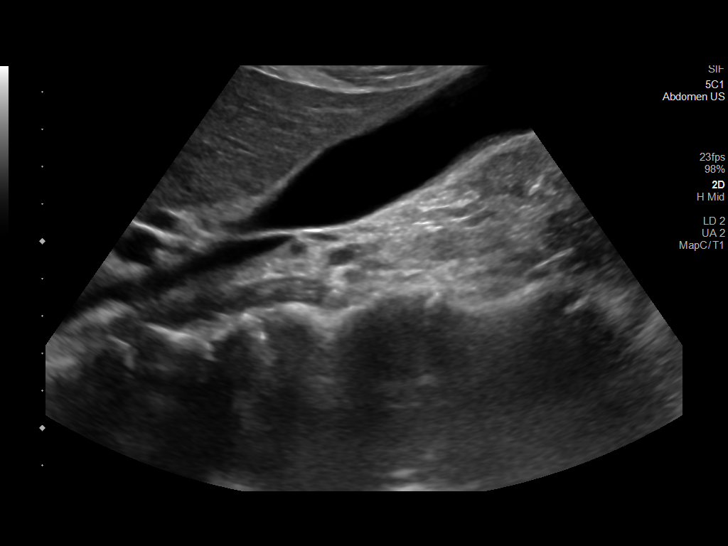
[im 6/71]
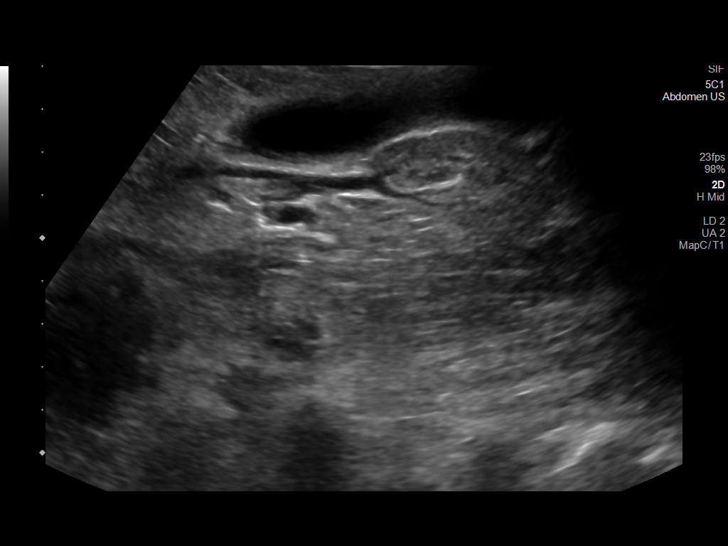
[im 12/71]
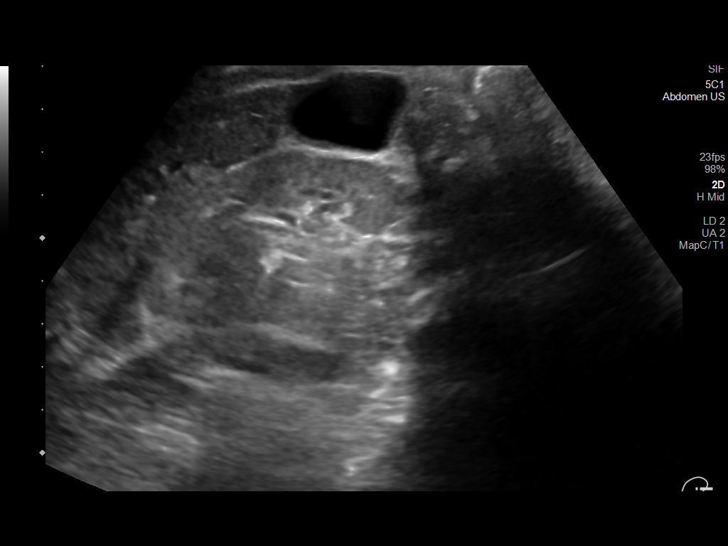
[im 18/71]
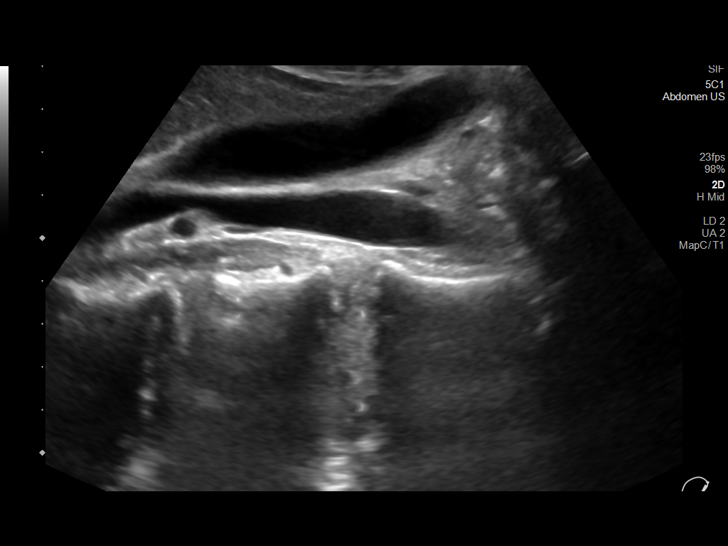
[im 24/71]
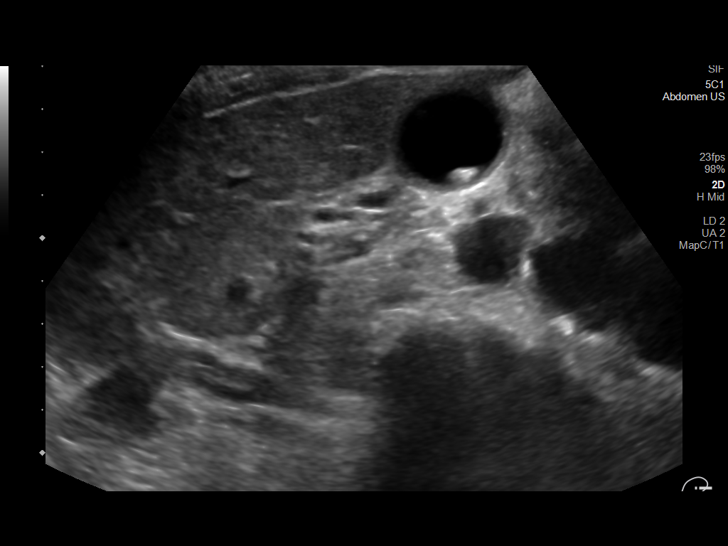
[im 27/71]
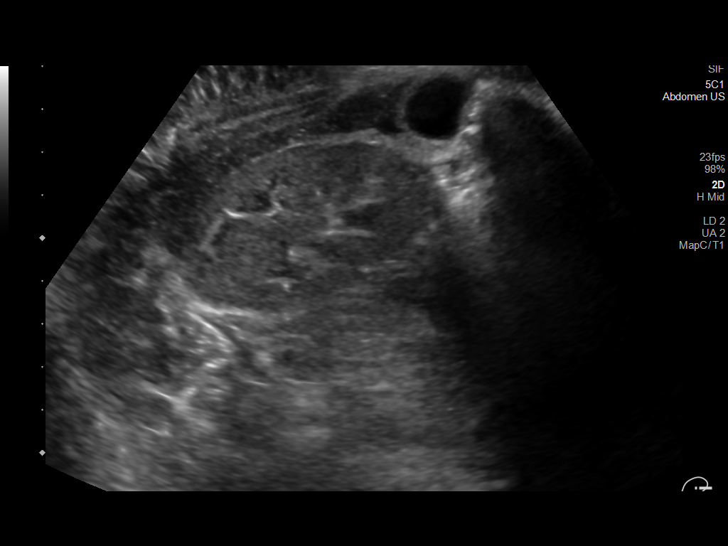
[im 33/71]
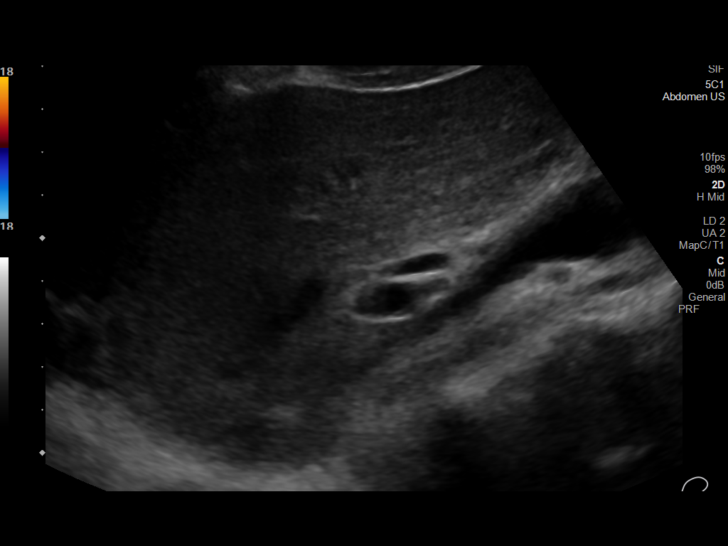
[im 38/71]
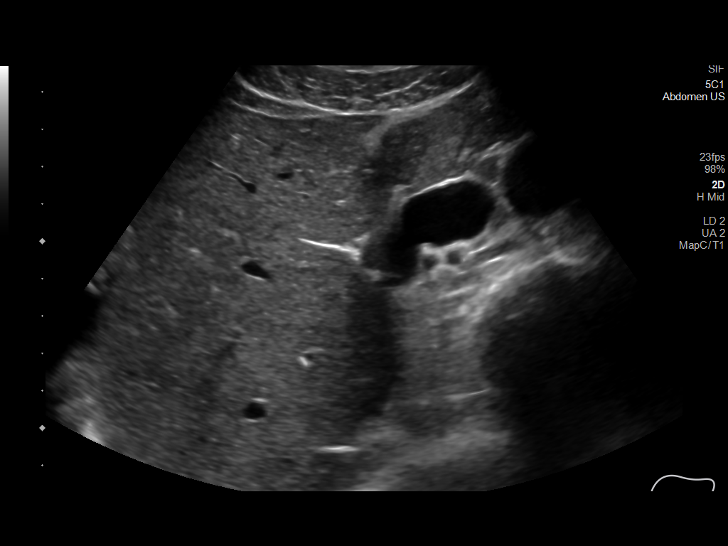
[im 44/71]
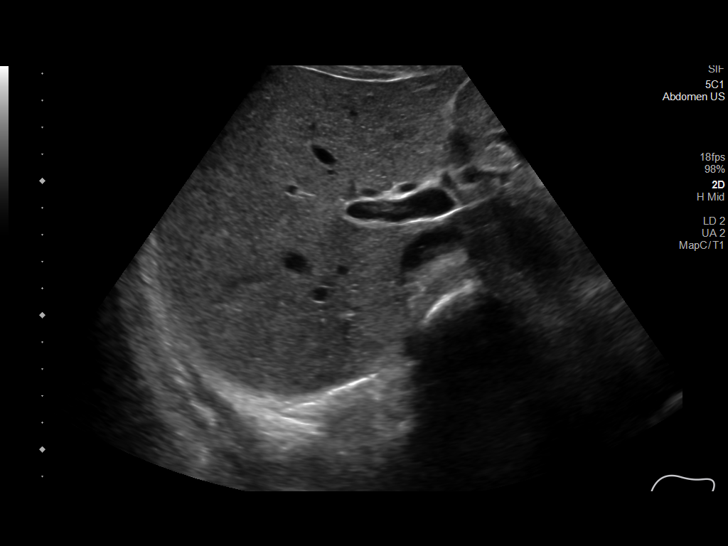
[im 47/71]
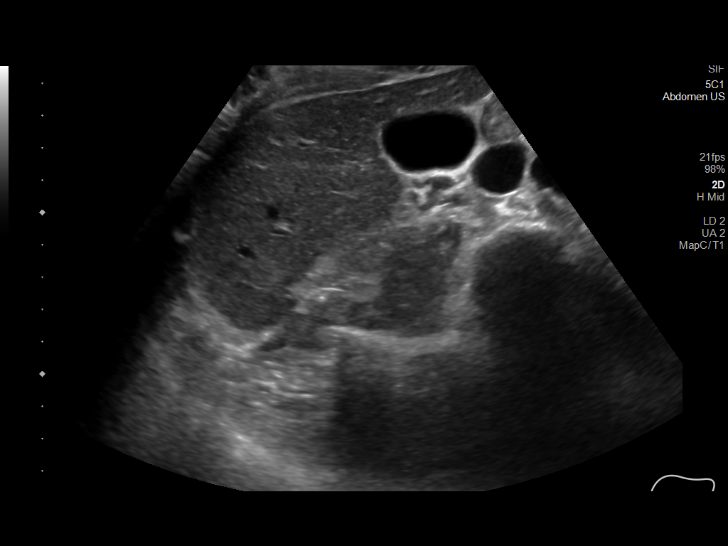
[im 53/71]
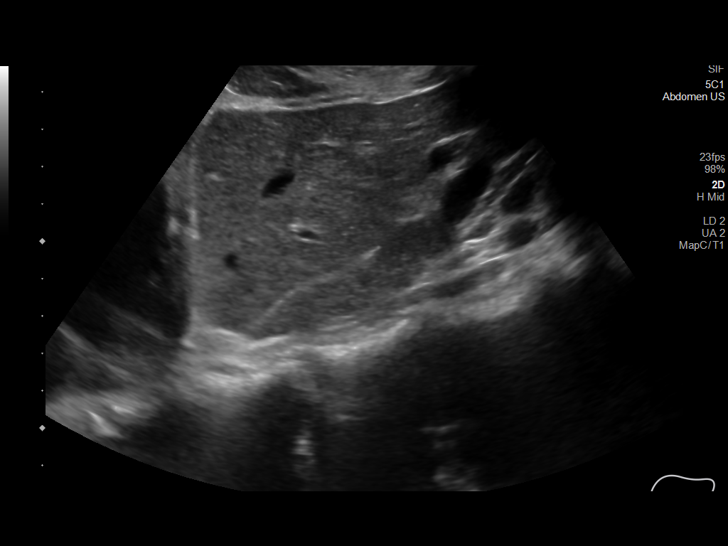
[im 59/71]
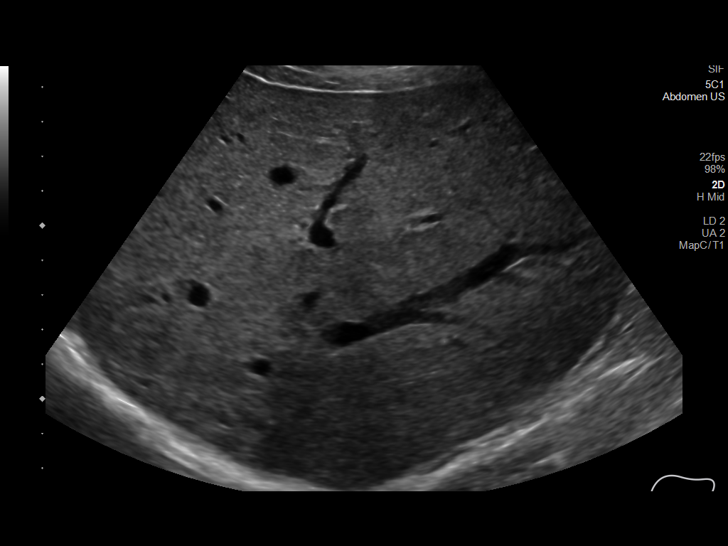
[im 65/71]
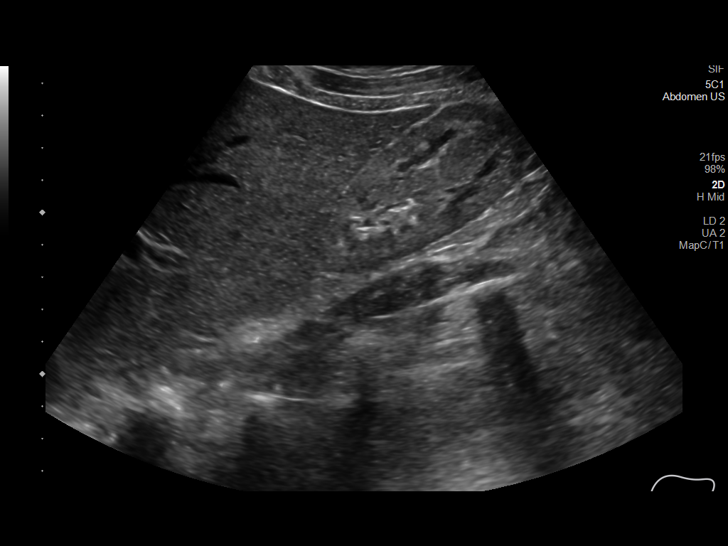
[im 71/71]
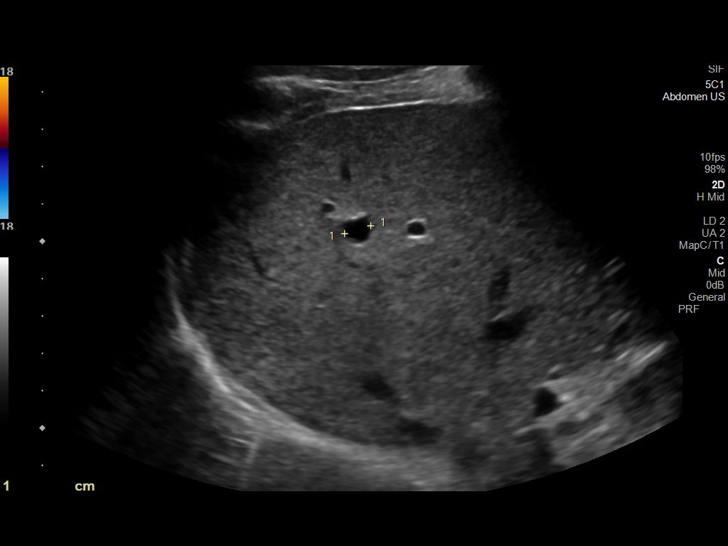

[14 of 25 positions shown; findings below may reference images not displayed]

FINDINGS: Gallbladder:

9 mm gallstone. No wall thickening or pericholecystic fluid.
Sonographic Murphy's sign was not elicited.

Common bile duct:

Diameter: Normal, 5 mm.

Liver:

Slightly increased hepatic echogenicity. High right hepatic lobe 9
mm cyst. Portal vein is patent on color Doppler imaging with normal
direction of blood flow towards the liver.

Other: None.
IMPRESSION: Cholelithiasis without acute cholecystitis or biliary duct
dilatation.

Probable mild hepatic steatosis.

## 2024-01-23 IMAGING — CT CT ANGIO CHEST-ABD-PELV FOR DISSECTION W/ AND WO/W CM
2 of 7 series · 10 of 46 positions shown, 11 images · non-contrast
Comparison: Previous studies including the examination done on
09/24/2018
COMPARISON: Previous studies including the examination done on
09/24/2018

Addendum:
CLINICAL DATA: Chest pain, abdominal pain

EXAM:
CT ANGIOGRAPHY CHEST, ABDOMEN AND PELVIS
TECHNIQUE: Multidetector CT imaging through the chest, abdomen and pelvis was
performed using the standard protocol during bolus administration of
intravenous contrast. Multiplanar reconstructed images and MIPs were
obtained and reviewed to evaluate the vascular anatomy.

[Series 6: dissection 3.0 i30f 3 · axial · 0.94mm/px · z∈[+953,+1529]mm · 7 of 240 slices shown, 8 images]
[im 32/240  soft-tissue]
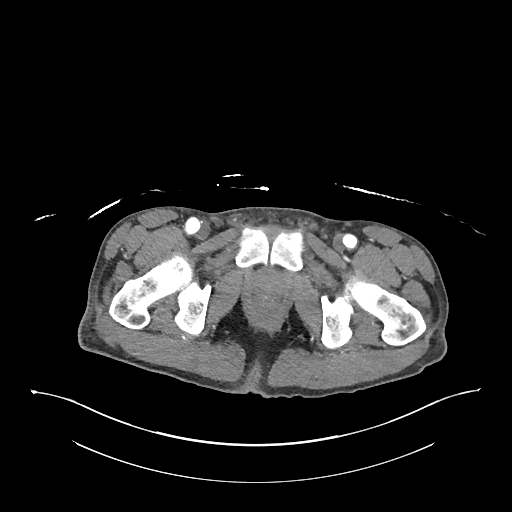
[im 32/240  bone]
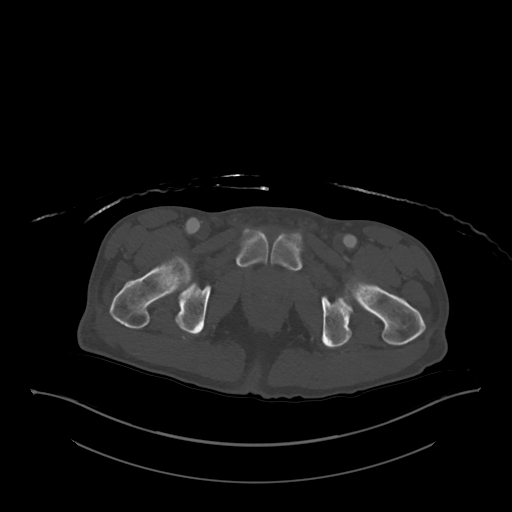
[im 64/240  soft-tissue]
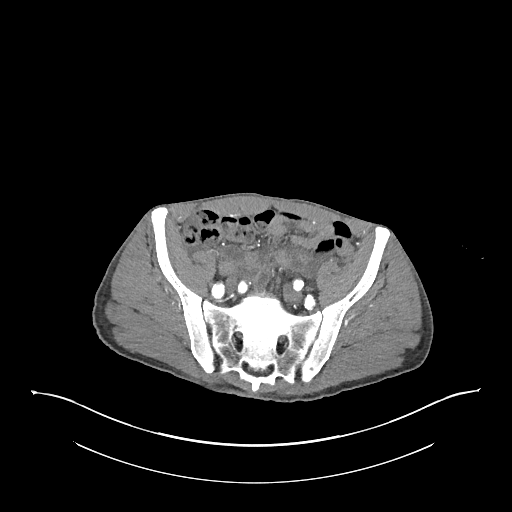
[im 96/240  soft-tissue]
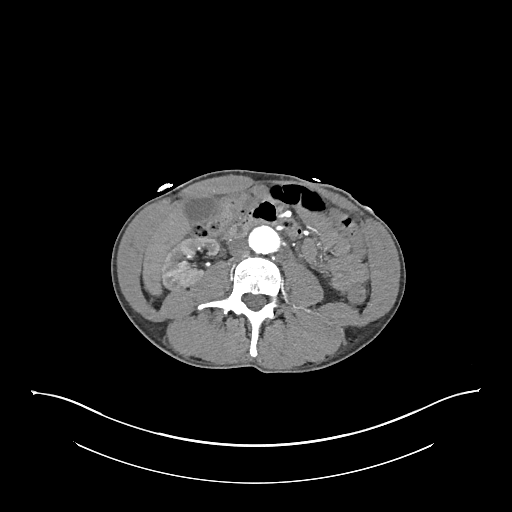
[im 128/240  soft-tissue]
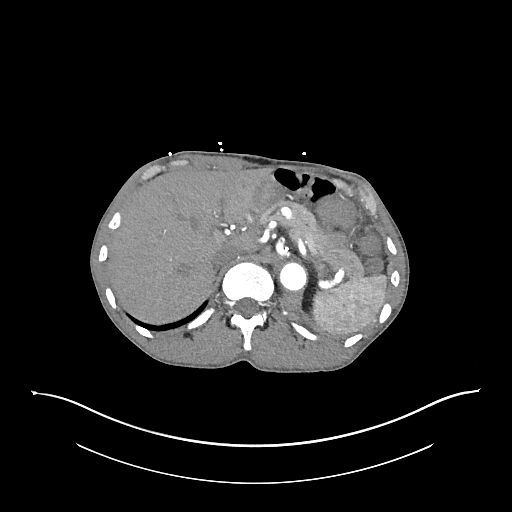
[im 160/240  soft-tissue]
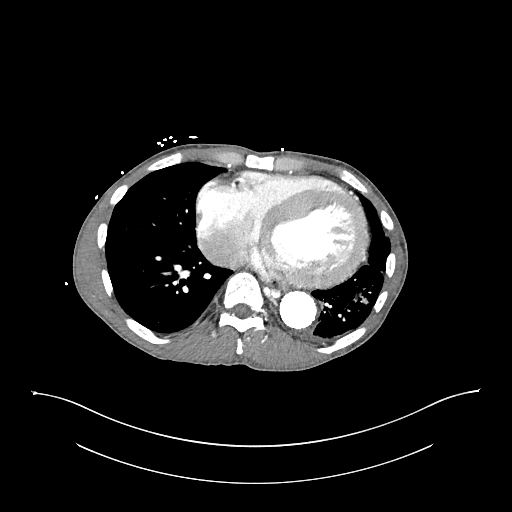
[im 192/240  soft-tissue]
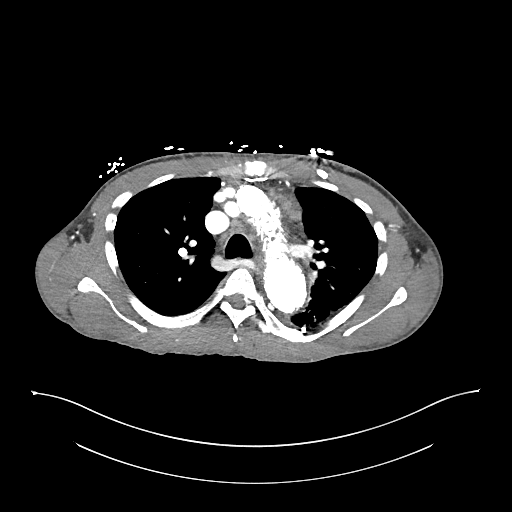
[im 224/240  soft-tissue]
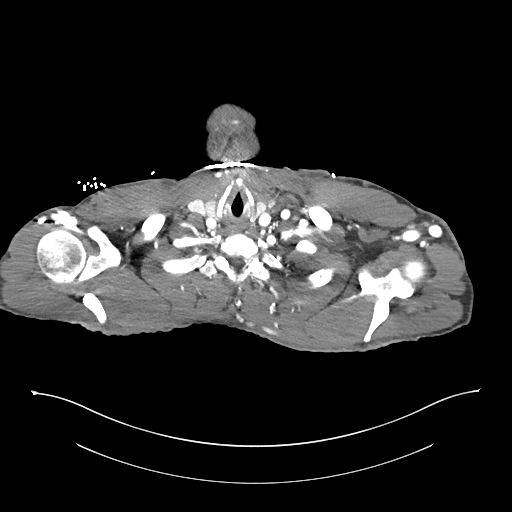

[Series 9: coronals · coronal · 0.85mm/px · 3 of 151 slices shown]
[im 38/151  soft-tissue]
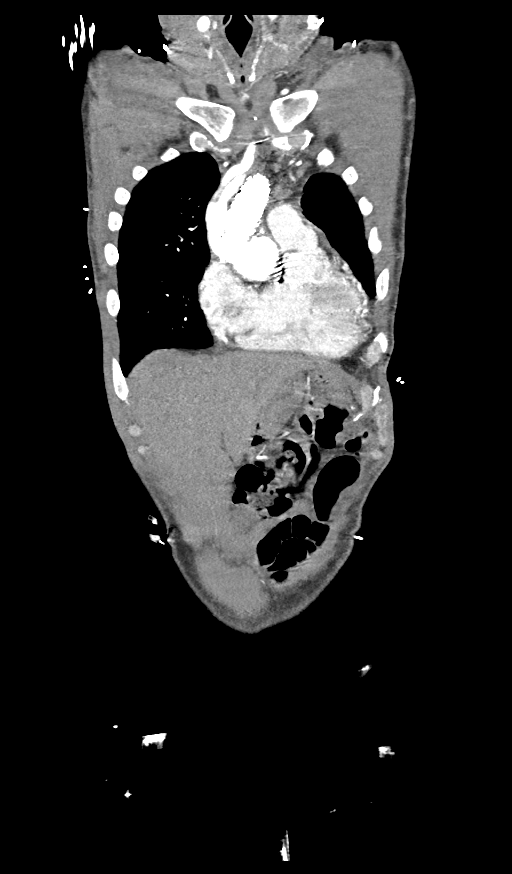
[im 76/151  soft-tissue]
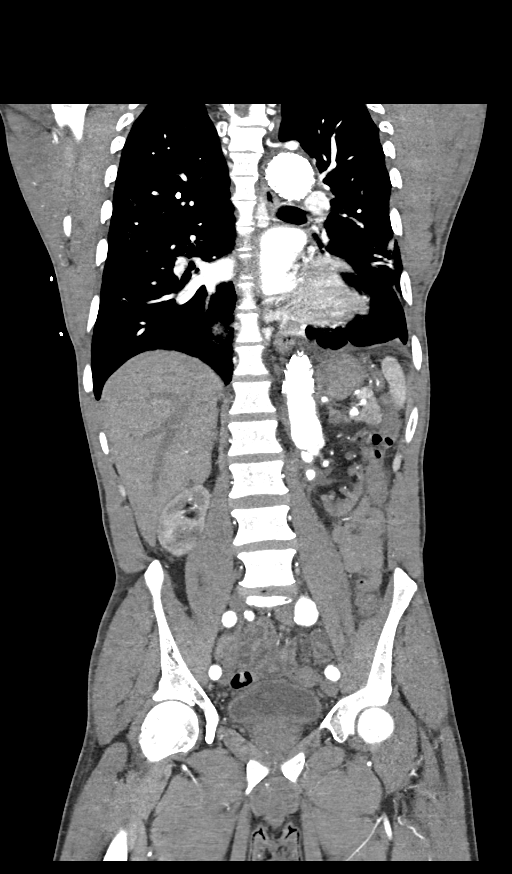
[im 113/151  soft-tissue]
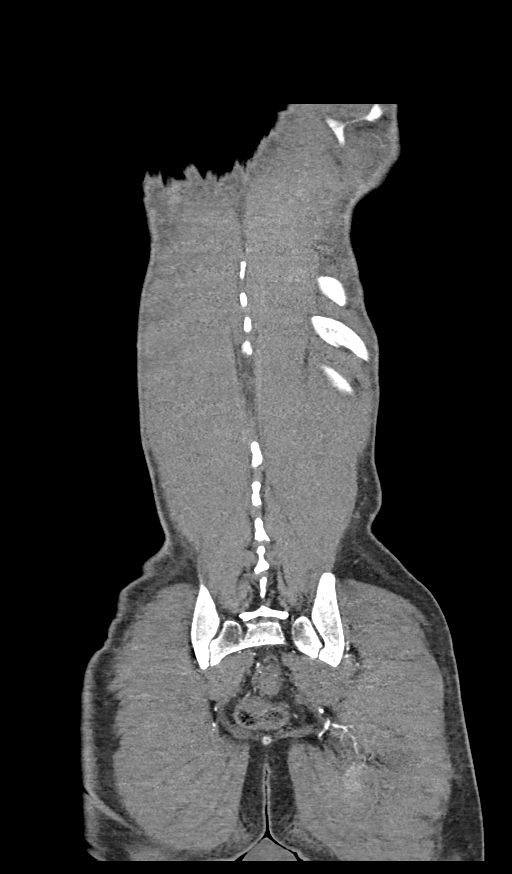

[10 of 46 positions shown; findings below may reference images not displayed]

RADIATION DOSE REDUCTION: This exam was performed according to the
departmental dose-optimization program which includes automated
exposure control, adjustment of the mA and/or kV according to
patient size and/or use of iterative reconstruction technique.

CONTRAST:  100mL OMNIPAQUE IOHEXOL 350 MG/ML SOLN
FINDINGS: CTA CHEST FINDINGS

Cardiovascular: Heart is enlarged in size. Coronary artery
calcifications are seen. There is homogeneous enhancement in
thoracic aorta. Stent is seen in the ascending thoracic aorta,
aortic arch and descending thoracic aorta with no significant
interval change. There is no extravasation of contrast. Major
brachiocephalic vessels are patent.

Mediastinum/Nodes: No new significant lymphadenopathy seen.

Lungs/Pleura: There are small linear patchy densities in the left
mid and left lower lung fields with no significant interval change.
Small left pleural effusion is seen with interval increase. There is
no right pleural effusion. There is no pneumothorax.

Musculoskeletal: Unremarkable.

Review of the MIP images confirms the above findings.

CTA ABDOMEN AND PELVIS FINDINGS

VASCULAR

Aorta: There is tortuosity and ectasia. Stent is noted in the wall
of upper abdominal aorta. Are metallic densities in the proximal
courses SMV and right renal arteries, possibly stents. There is
dense calcification in the proximal course of the left renal artery.
There is 4.1 cm low-density structure without intraluminal contrast
at the level of left renal artery. This finding may suggest
thrombosed aneurysm/dissection with no significant interval change.
There is ectasia of infrarenal aorta measuring 3.5 cm with no
significant interval change.

Celiac: Patent

SMA: Patent

Renals: Patent

IMA: Possible stenosis at the origin but patent

Iliac arteries: Patent. There is 2.6 cm aneurysm in the left common
iliac artery. There is 2.2 cm aneurysm in the right common iliac
artery.

Veins: Unremarkable

Review of the MIP images confirms the above findings.

NON-VASCULAR

Hepatobiliary: There is fatty infiltration in the liver. Liver is
enlarged measuring 19.8 cm. There are few small low-density foci in
the liver largest measuring 9 mm, possibly cysts or hemangiomas. In
image 131, there is subtle increased density in the neck of the
gallbladder. There is no significant wall thickening in gallbladder.

Pancreas: There is prominence of pancreatic duct. No focal
abnormality is seen.

Spleen: Not enlarged.

Adrenals/Urinary Tract: Adrenals are not optimally visualized. As
far as seen there is no enlargement of adrenals. There is no
hydronephrosis. There is decreased vascular perfusion in the left
renal cortex. There is cortical thinning in the right kidney.
Urinary bladder is unremarkable.

Stomach/Bowel: Small hiatal hernia is seen. Stomach is unremarkable.
Small bowel loops are not dilated. Appendix is not seen. There is no
focal pericecal inflammation. There is no significant wall
thickening in colon.

Lymphatic: No new significant lymphadenopathy seen.

Reproductive: Unremarkable.

Other: There is no ascites or pneumoperitoneum.

Musculoskeletal: Unremarkable.

Review of the MIP images confirms the above findings.
IMPRESSION: There is no evidence of dissection in the thoracic and abdominal
aorta. Vascular stents are noted in thoracic aorta and upper
abdominal aorta. There are possible vascular stents in the major
branches of abdominal aorta. There is ectasia of infrarenal
abdominal aorta with no significant interval change. Aneurysmal
dilation is seen in both common iliac arteries with no significant
interval change. There is no evidence of mediastinal or
retroperitoneal hematoma.

Cardiomegaly. Coronary artery calcifications are seen. Small left
pleural effusion with interval increase. Linear densities in the
left lower lung fields suggest scarring or subsegmental atelectasis.

There is interval decrease in cortical enhancement in both kidneys,
more so on the left side. Possibility of stenosis in the renal
arteries is not excluded.

There is no evidence of intestinal obstruction or pneumoperitoneum.
There is no hydronephrosis.

Other findings as described in the body of the report.

ADDENDUM:
Following should be added to the impression.

There is subtle increased density in the neck of the gallbladder
suggesting possible presence of small gallbladder stones.

*** End of Addendum ***
RADIATION DOSE REDUCTION: This exam was performed according to the
departmental dose-optimization program which includes automated
exposure control, adjustment of the mA and/or kV according to
patient size and/or use of iterative reconstruction technique.

CONTRAST:  100mL OMNIPAQUE IOHEXOL 350 MG/ML SOLN
FINDINGS: CTA CHEST FINDINGS

Cardiovascular: Heart is enlarged in size. Coronary artery
calcifications are seen. There is homogeneous enhancement in
thoracic aorta. Stent is seen in the ascending thoracic aorta,
aortic arch and descending thoracic aorta with no significant
interval change. There is no extravasation of contrast. Major
brachiocephalic vessels are patent.

Mediastinum/Nodes: No new significant lymphadenopathy seen.

Lungs/Pleura: There are small linear patchy densities in the left
mid and left lower lung fields with no significant interval change.
Small left pleural effusion is seen with interval increase. There is
no right pleural effusion. There is no pneumothorax.

Musculoskeletal: Unremarkable.

Review of the MIP images confirms the above findings.

CTA ABDOMEN AND PELVIS FINDINGS

VASCULAR

Aorta: There is tortuosity and ectasia. Stent is noted in the wall
of upper abdominal aorta. Are metallic densities in the proximal
courses SMV and right renal arteries, possibly stents. There is
dense calcification in the proximal course of the left renal artery.
There is 4.1 cm low-density structure without intraluminal contrast
at the level of left renal artery. This finding may suggest
thrombosed aneurysm/dissection with no significant interval change.
There is ectasia of infrarenal aorta measuring 3.5 cm with no
significant interval change.

Celiac: Patent

SMA: Patent

Renals: Patent

IMA: Possible stenosis at the origin but patent

Iliac arteries: Patent. There is 2.6 cm aneurysm in the left common
iliac artery. There is 2.2 cm aneurysm in the right common iliac
artery.

Veins: Unremarkable

Review of the MIP images confirms the above findings.

NON-VASCULAR

Hepatobiliary: There is fatty infiltration in the liver. Liver is
enlarged measuring 19.8 cm. There are few small low-density foci in
the liver largest measuring 9 mm, possibly cysts or hemangiomas. In
image 131, there is subtle increased density in the neck of the
gallbladder. There is no significant wall thickening in gallbladder.

Pancreas: There is prominence of pancreatic duct. No focal
abnormality is seen.

Spleen: Not enlarged.

Adrenals/Urinary Tract: Adrenals are not optimally visualized. As
far as seen there is no enlargement of adrenals. There is no
hydronephrosis. There is decreased vascular perfusion in the left
renal cortex. There is cortical thinning in the right kidney.
Urinary bladder is unremarkable.

Stomach/Bowel: Small hiatal hernia is seen. Stomach is unremarkable.
Small bowel loops are not dilated. Appendix is not seen. There is no
focal pericecal inflammation. There is no significant wall
thickening in colon.

Lymphatic: No new significant lymphadenopathy seen.

Reproductive: Unremarkable.

Other: There is no ascites or pneumoperitoneum.

Musculoskeletal: Unremarkable.

Review of the MIP images confirms the above findings.
IMPRESSION: There is no evidence of dissection in the thoracic and abdominal
aorta. Vascular stents are noted in thoracic aorta and upper
abdominal aorta. There are possible vascular stents in the major
branches of abdominal aorta. There is ectasia of infrarenal
abdominal aorta with no significant interval change. Aneurysmal
dilation is seen in both common iliac arteries with no significant
interval change. There is no evidence of mediastinal or
retroperitoneal hematoma.

Cardiomegaly. Coronary artery calcifications are seen. Small left
pleural effusion with interval increase. Linear densities in the
left lower lung fields suggest scarring or subsegmental atelectasis.

There is interval decrease in cortical enhancement in both kidneys,
more so on the left side. Possibility of stenosis in the renal
arteries is not excluded.

There is no evidence of intestinal obstruction or pneumoperitoneum.
There is no hydronephrosis.

Other findings as described in the body of the report.

## 2024-01-24 ENCOUNTER — Other Ambulatory Visit (HOSPITAL_BASED_OUTPATIENT_CLINIC_OR_DEPARTMENT_OTHER): Payer: Self-pay

## 2024-02-05 IMAGING — CR DG CHEST 2V
2 series · 2 of 2 positions shown · non-contrast
Comparison: 05/27/2021

CLINICAL DATA: Chest pain

EXAM:
CHEST - 2 VIEW

[chest pa]
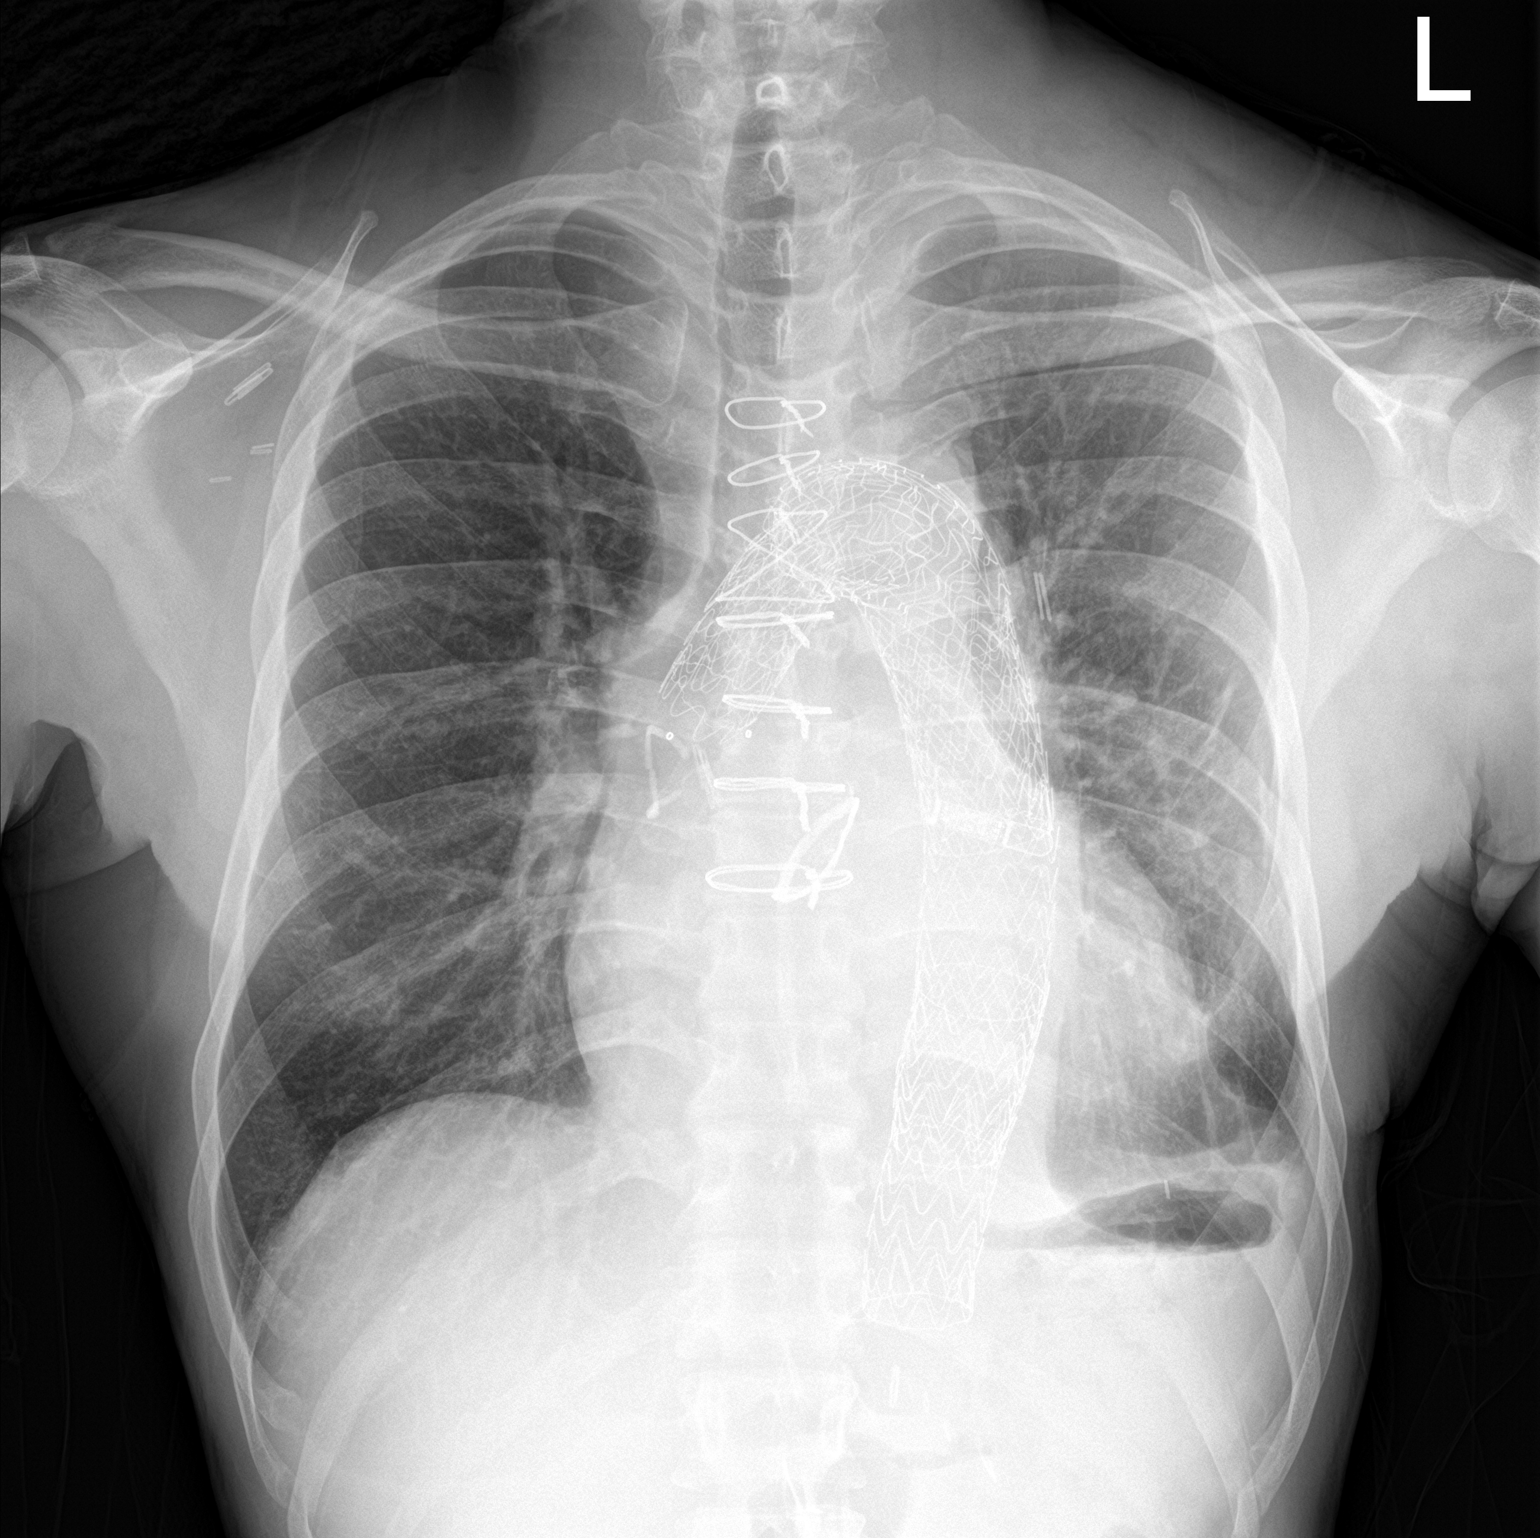

[chest lat]
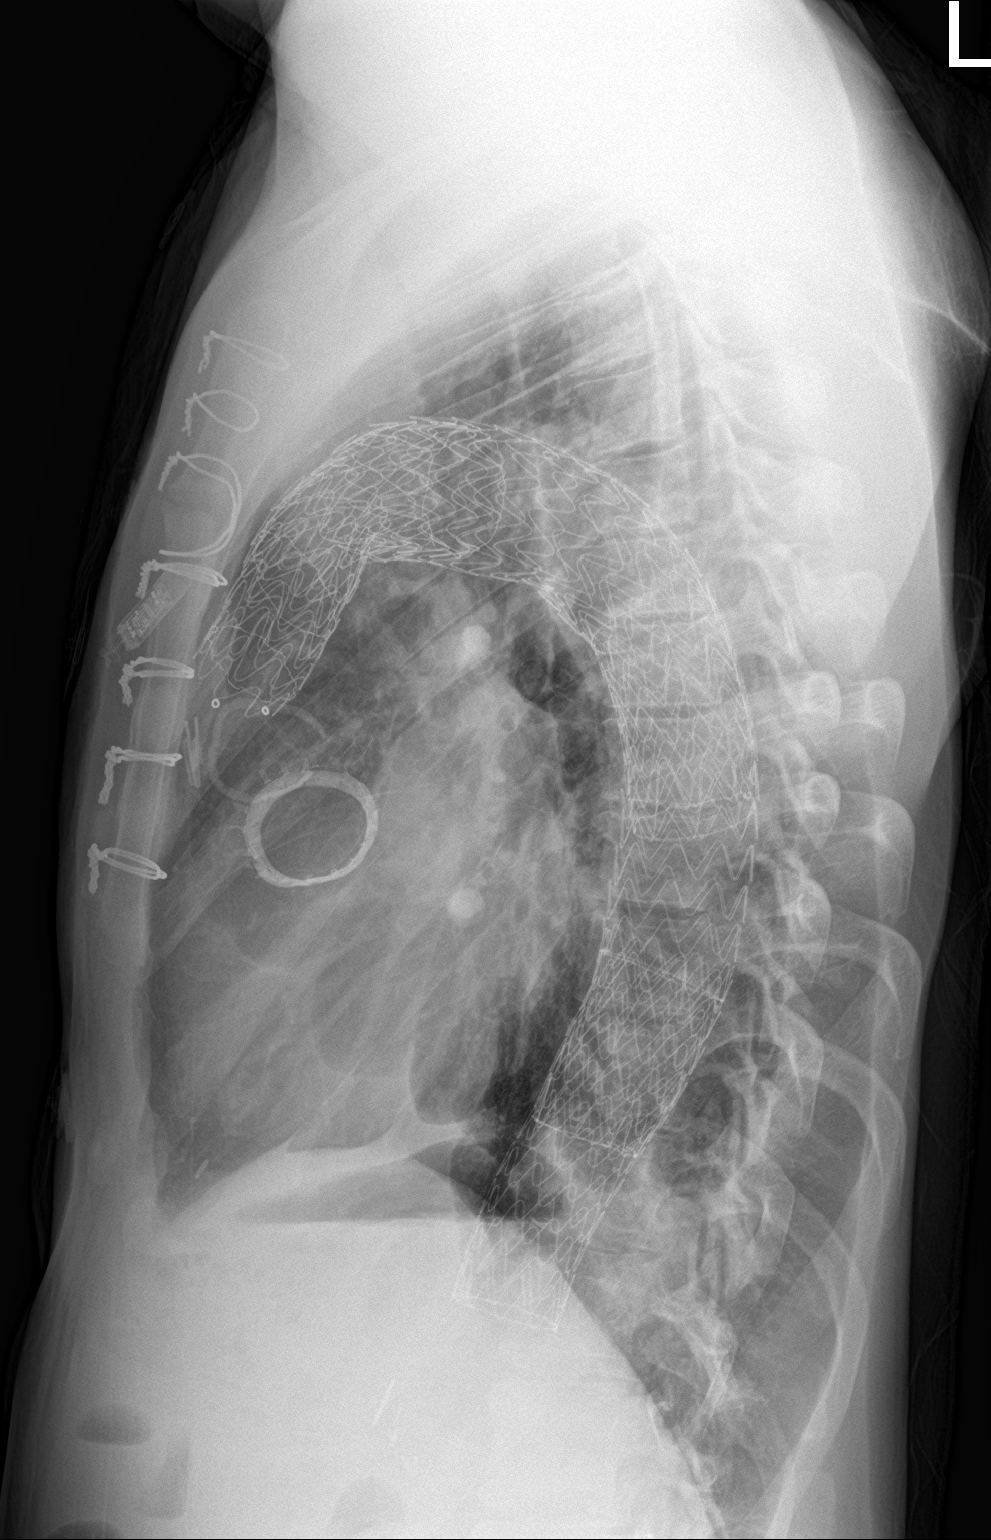

[2 of 2 positions shown; findings below may reference images not displayed]

FINDINGS: Cardiac shadow is enlarged but stable. Loop recorder is noted.
Postsurgical changes and aortic stent graft are again seen and
stable. Chronic scarring is again seen in the left base. No focal
infiltrate or effusion is noted. No bony abnormality is seen.
IMPRESSION: Chronic changes without acute abnormality.

## 2024-02-06 IMAGING — CT CT NECK W/ CM
3 of 4 series · 12 of 35 positions shown, 14 images · IV contrast (agent unspecified)
Comparison: Chest radiographs 06/09/2021. CTA chest today is
reported separately.

Neck CT 12/27/2012.
COMPARISON: Chest radiographs 06/09/2021. CTA chest today is
reported separately.

Neck CT 12/27/2012.

Addendum:
CLINICAL DATA: 50-year-old male with venous distension in the left
neck with swelling and pain over the past 3 days. Symptom onset
after dental surgery 1 month ago.

EXAM:
CT NECK WITH CONTRAST
TECHNIQUE: Multidetector CT imaging of the neck was performed using the
standard protocol following the bolus administration of intravenous
contrast.

[Series 4: sagittal · sagittal · 0.49mm/px · 5 of 137 slices shown, 6 images]
[im 46/137  bone]
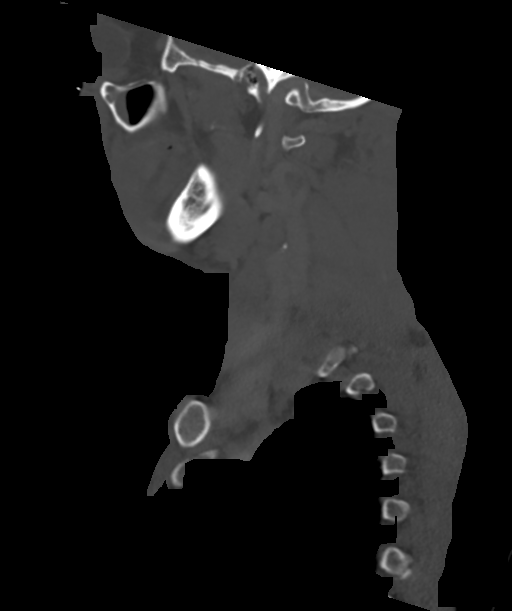
[im 57/137  bone]
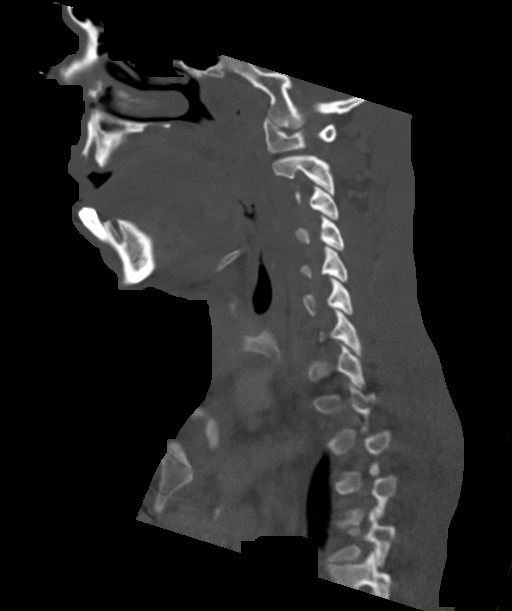
[im 69/137  soft-tissue]
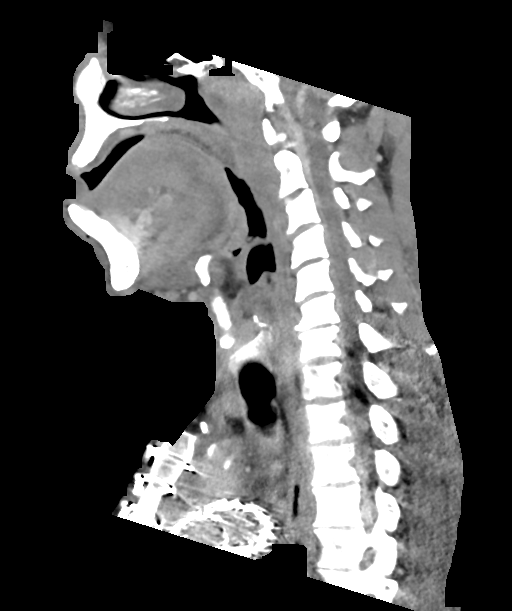
[im 69/137  bone]
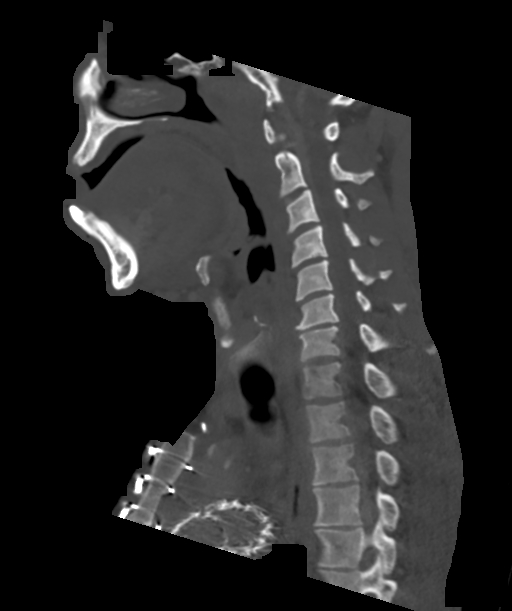
[im 80/137  bone]
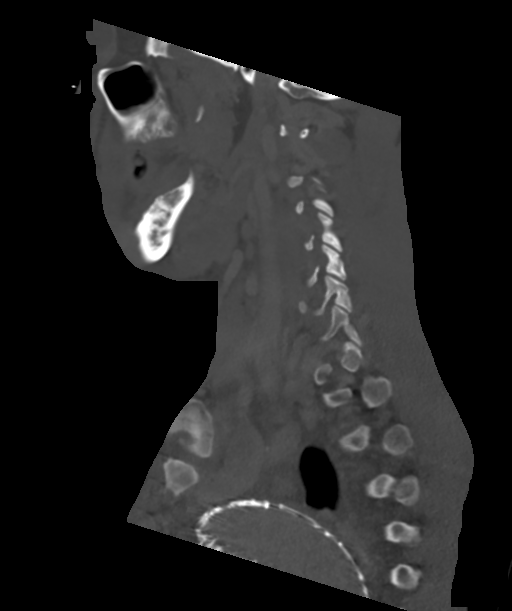
[im 91/137  bone]
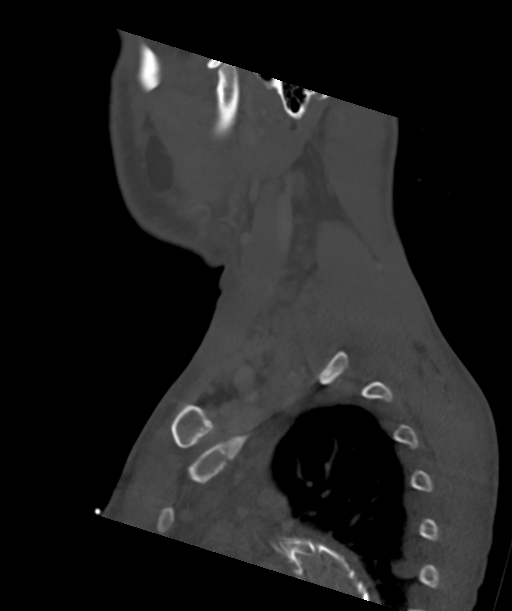

[Series 5: coronal · coronal · 0.50mm/px · 3 of 116 slices shown]
[im 39/116  bone]
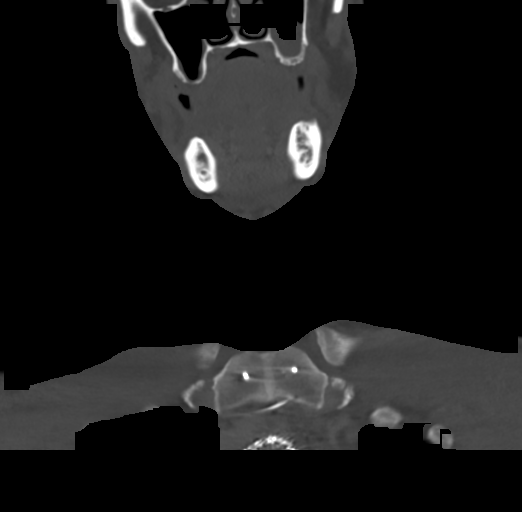
[im 52/116  bone]
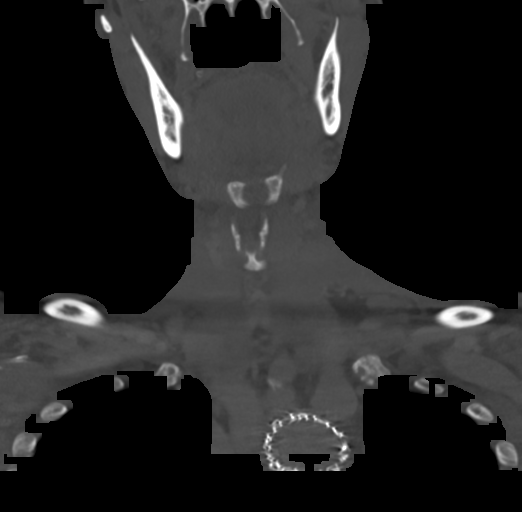
[im 65/116  bone]
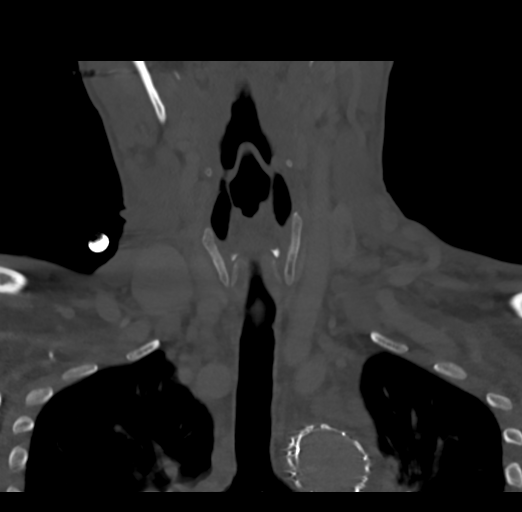

[Series 6: orthogonal · axial · 0.47mm/px · z∈[-320,-138]mm · 4 of 136 slices shown, 5 images]
[im 20/136  soft-tissue]
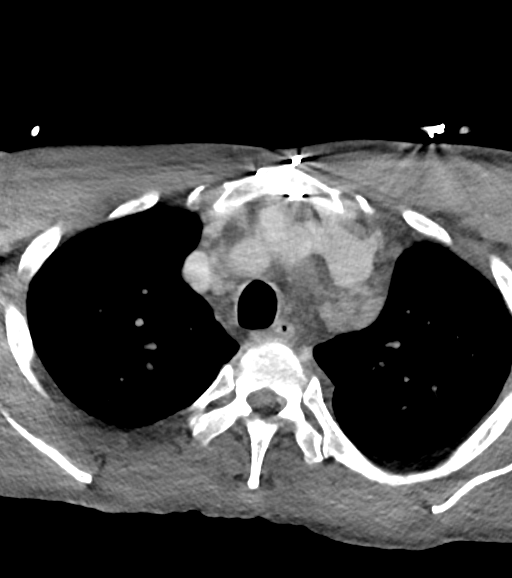
[im 20/136  bone]
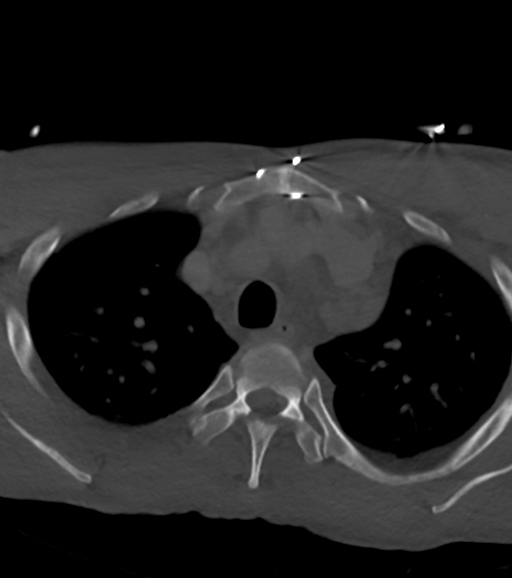
[im 58/136  bone]
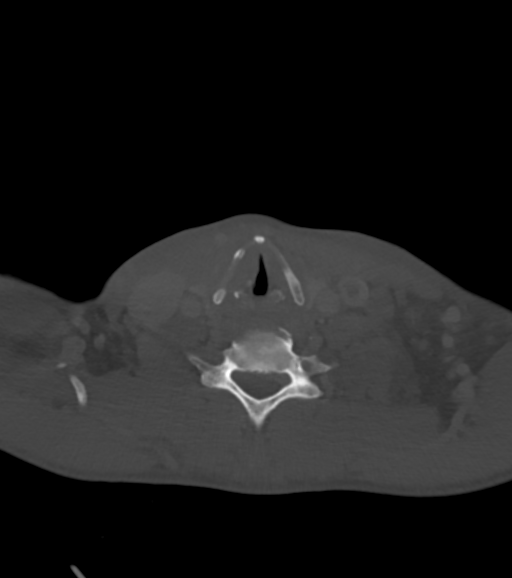
[im 78/136  bone]
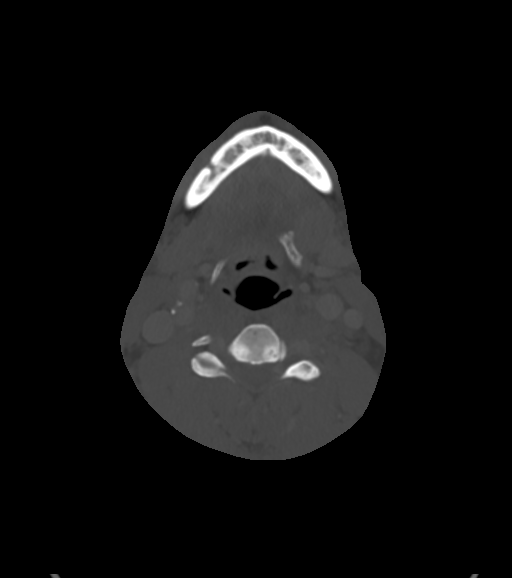
[im 116/136  bone]
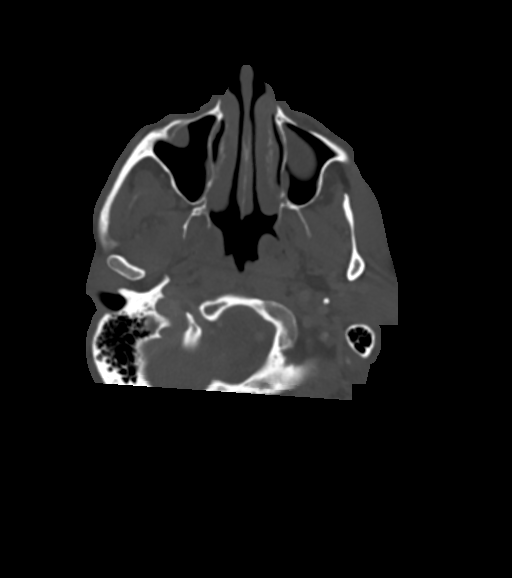

[12 of 35 positions shown; findings below may reference images not displayed]

RADIATION DOSE REDUCTION: This exam was performed according to the
departmental dose-optimization program which includes automated
exposure control, adjustment of the mA and/or kV according to
patient size and/or use of iterative reconstruction technique.

CONTRAST:  75mL OMNIPAQUE IOHEXOL 350 MG/ML SOLN
FINDINGS: Pharynx and larynx: Laryngeal and pharyngeal soft tissue contours
are stable since 2235 and within normal limits. No parapharyngeal or
retropharyngeal space fluid or inflammation.

Salivary glands: Sublingual space is within normal limits.
Submandibular glands and parotid glands are symmetric and within
normal limits.

Thyroid: Negative.

Lymph nodes: Negative.  No cervical lymphadenopathy.

Vascular: Positive for nonocclusive thrombus in the left internal
jugular vein from just above the thoracic inlet (series 3, image 70)
to the level of the hyoid bone (image 58). And furthermore there
appears to be focal thrombus and venous stenosis in the upper chest
at the confluence of the left subclavian and internal jugular veins
on series 3, image 80. However, but the left IJ remains patent.
There is asymmetric enlargement of the left external jugular vein,
and otherwise mild asymmetric enlargement of left retromandibular
and submandibular veins.

Other Major vascular structures in the neck and at the skull base
are patent. Right IJ is chronically dominant (normal variant). Mild
carotid atherosclerosis.

Limited intracranial: Minimally included, negative.

Visualized orbits: Negative.

Mastoids and visualized paranasal sinuses: Chronic right lamina
papyracea fracture. Mild to moderate bilateral ethmoid and maxillary
sinus mucosal thickening is chronic and not significantly changed
from 2235. Visible right tympanic cavity and mastoids are clear.

Skeleton: Dental extraction sequelae, only residual anterior
mandible dentition. Chronic right lamina papyracea fracture. No
acute osseous abnormality identified.

Upper chest: Reported separately.
IMPRESSION: 1. Positive for Incomplete Thrombosis Of The Left Internal Jugular
Vein from the confluence with the subclavian vein to the level of
the hyoid bone. The left IJ remains patent, but there is asymmetric
left external jugular venous distension.

2. No other acute or inflammatory process identified in the neck.
Chest CTA is reported separately.

ADDENDUM:
Study discussed by telephone with Dr. ABEL LUIS SANGUINETI on 06/10/2021
at 3122 hours.

*** End of Addendum ***
RADIATION DOSE REDUCTION: This exam was performed according to the
departmental dose-optimization program which includes automated
exposure control, adjustment of the mA and/or kV according to
patient size and/or use of iterative reconstruction technique.

CONTRAST:  75mL OMNIPAQUE IOHEXOL 350 MG/ML SOLN
FINDINGS: Pharynx and larynx: Laryngeal and pharyngeal soft tissue contours
are stable since 2235 and within normal limits. No parapharyngeal or
retropharyngeal space fluid or inflammation.

Salivary glands: Sublingual space is within normal limits.
Submandibular glands and parotid glands are symmetric and within
normal limits.

Thyroid: Negative.

Lymph nodes: Negative.  No cervical lymphadenopathy.

Vascular: Positive for nonocclusive thrombus in the left internal
jugular vein from just above the thoracic inlet (series 3, image 70)
to the level of the hyoid bone (image 58). And furthermore there
appears to be focal thrombus and venous stenosis in the upper chest
at the confluence of the left subclavian and internal jugular veins
on series 3, image 80. However, but the left IJ remains patent.
There is asymmetric enlargement of the left external jugular vein,
and otherwise mild asymmetric enlargement of left retromandibular
and submandibular veins.

Other Major vascular structures in the neck and at the skull base
are patent. Right IJ is chronically dominant (normal variant). Mild
carotid atherosclerosis.

Limited intracranial: Minimally included, negative.

Visualized orbits: Negative.

Mastoids and visualized paranasal sinuses: Chronic right lamina
papyracea fracture. Mild to moderate bilateral ethmoid and maxillary
sinus mucosal thickening is chronic and not significantly changed
from 2235. Visible right tympanic cavity and mastoids are clear.

Skeleton: Dental extraction sequelae, only residual anterior
mandible dentition. Chronic right lamina papyracea fracture. No
acute osseous abnormality identified.

Upper chest: Reported separately.
IMPRESSION: 1. Positive for Incomplete Thrombosis Of The Left Internal Jugular
Vein from the confluence with the subclavian vein to the level of
the hyoid bone. The left IJ remains patent, but there is asymmetric
left external jugular venous distension.

2. No other acute or inflammatory process identified in the neck.
Chest CTA is reported separately.

## 2024-02-06 IMAGING — CT CT ANGIO CHEST
2 of 5 series · 17 of 46 positions shown · IV contrast (Omni 300)
Comparison: Neck CT reported separately. CTA chest dissection
05/27/2021.

CLINICAL DATA: 50-year-old male with venous distension in the left
neck with swelling and pain over the past 3 days. Symptom onset
after dental surgery 1 month ago.

Incomplete occlusion of the left IJ on neck CT today.
EXAM:
CT ANGIOGRAPHY CHEST WITH CONTRAST
TECHNIQUE: Multidetector CT imaging of the chest was performed using the
standard protocol during bolus administration of intravenous
contrast. Multiplanar CT image reconstructions and MIPs were
obtained to evaluate the vascular anatomy.

[Series 5: thoracic cta 2mm · axial · 0.61mm/px · z∈[-488,-208]mm · 14 of 162 slices shown]
[im 11/162  lung]
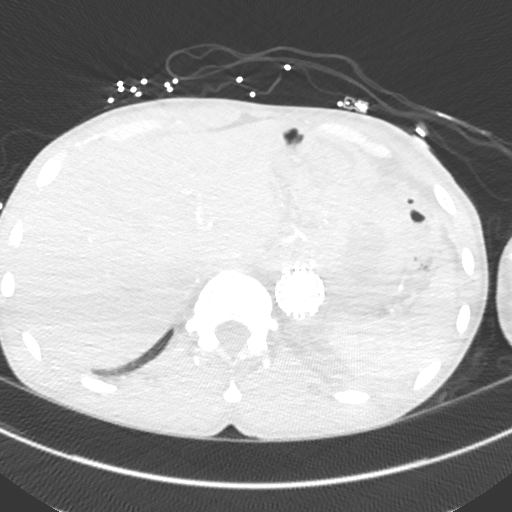
[im 21/162  soft-tissue]
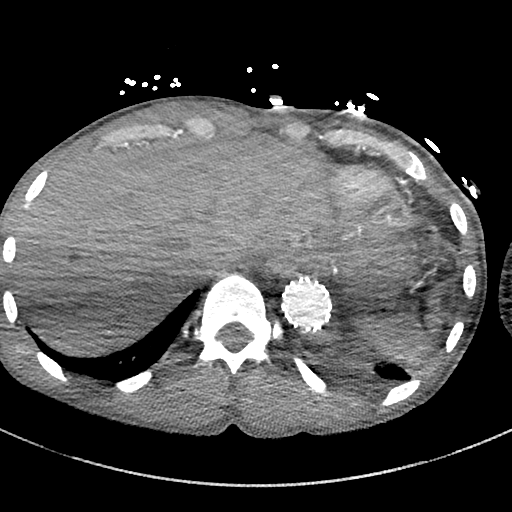
[im 32/162  lung]
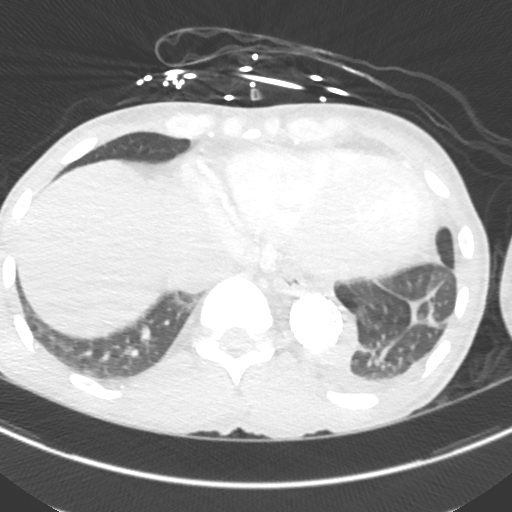
[im 42/162  soft-tissue]
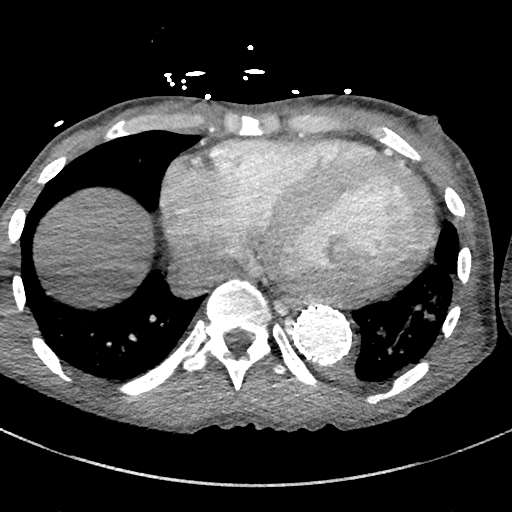
[im 52/162  lung]
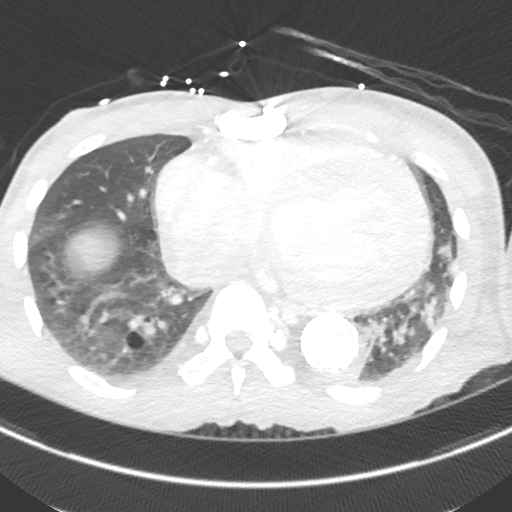
[im 63/162  soft-tissue]
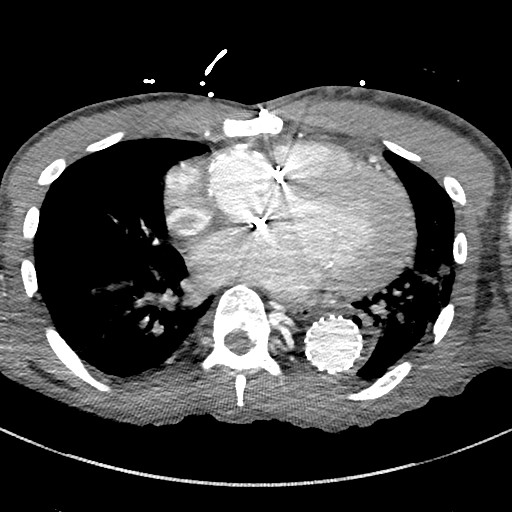
[im 73/162  lung]
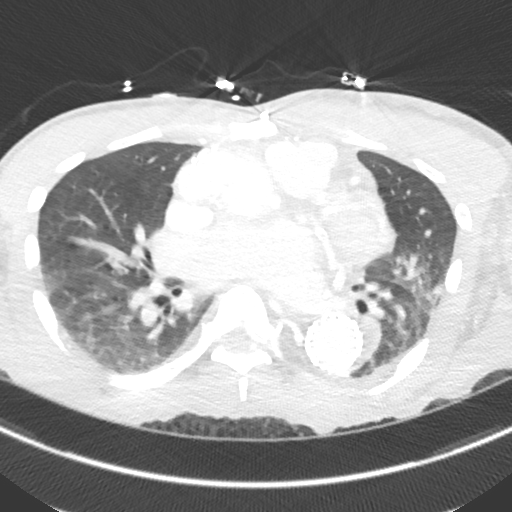
[im 89/162  soft-tissue]
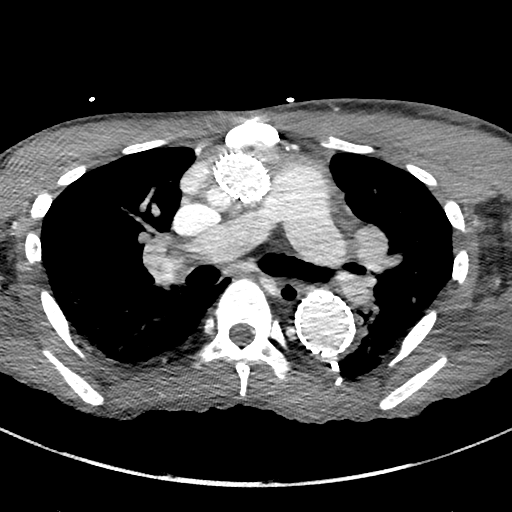
[im 99/162  lung]
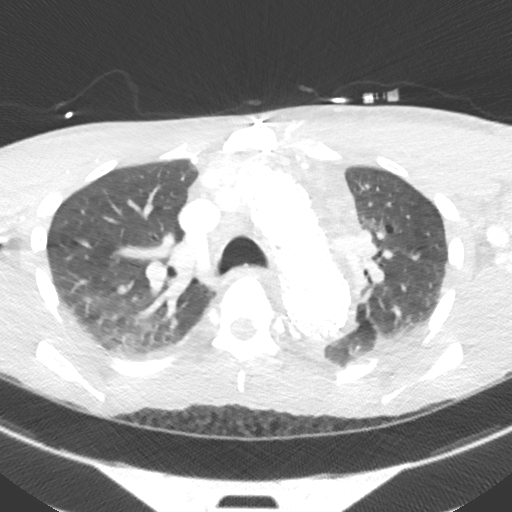
[im 110/162  soft-tissue]
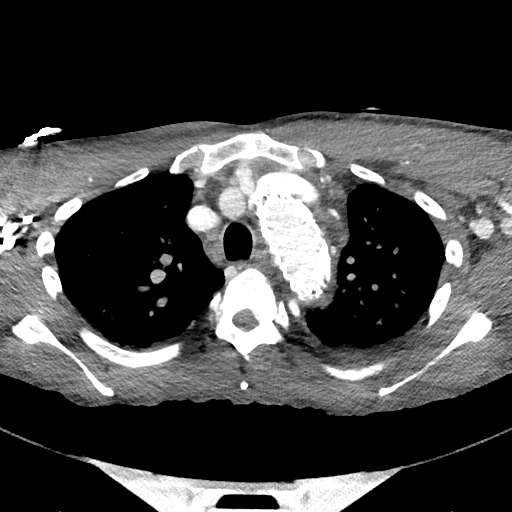
[im 120/162  lung]
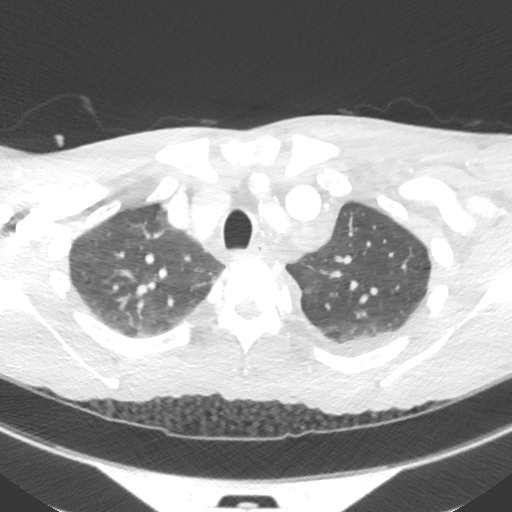
[im 130/162  soft-tissue]
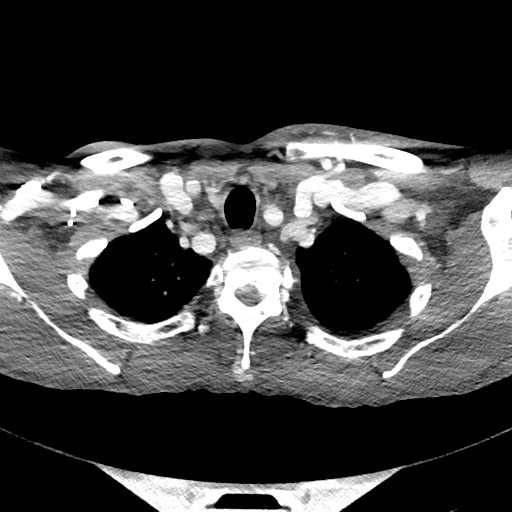
[im 141/162  lung]
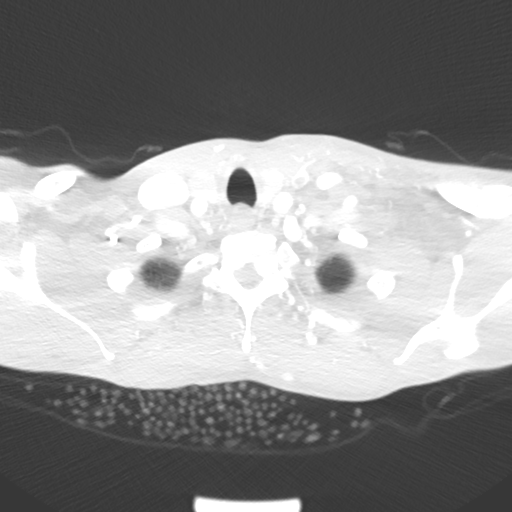
[im 151/162  soft-tissue]
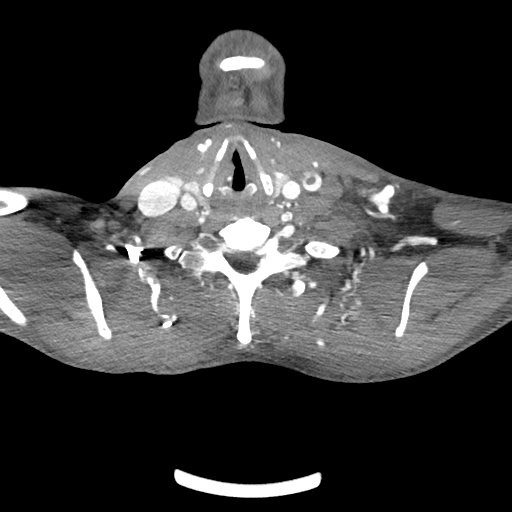

[Series 8: thoracic cta 2mm cor · coronal · 0.64mm/px · 3 of 108 slices shown]
[im 27/108  soft-tissue]
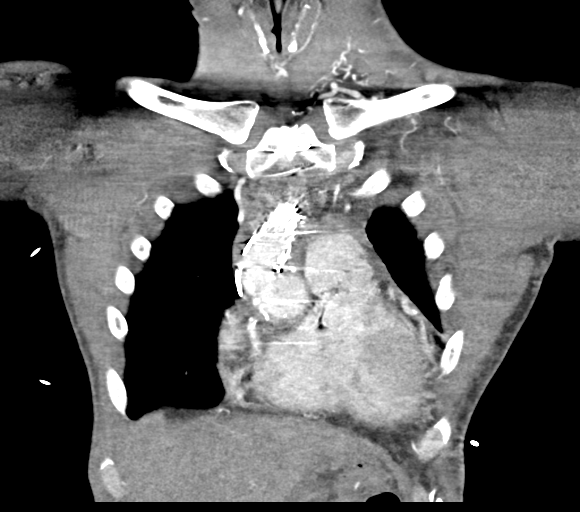
[im 54/108  soft-tissue]
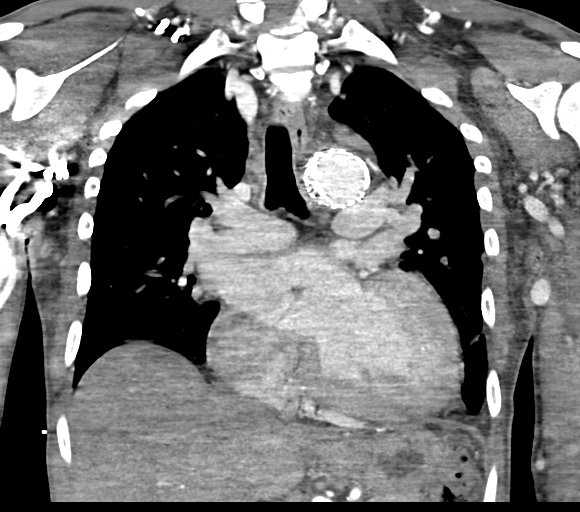
[im 81/108  soft-tissue]
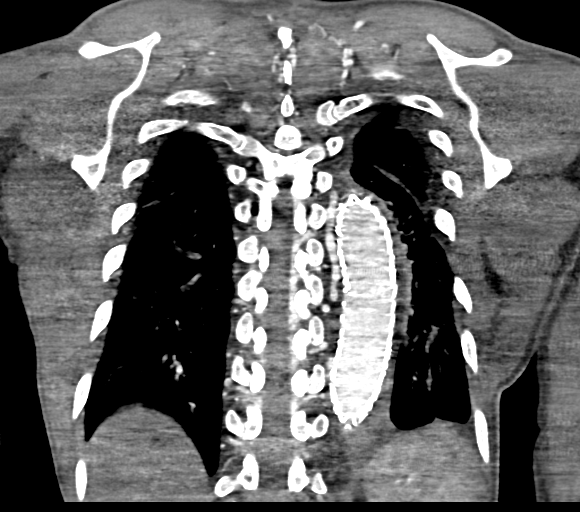

[17 of 46 positions shown; findings below may reference images not displayed]

RADIATION DOSE REDUCTION: This exam was performed according to the
departmental dose-optimization program which includes automated
exposure control, adjustment of the mA and/or kV according to
patient size and/or use of iterative reconstruction technique.

CONTRAST:  75mL OMNIPAQUE IOHEXOL 350 MG/ML SOLN in conjunction with
contrast enhanced imaging of the neck reported separately.
FINDINGS: Cardiovascular: Thrombus in the lower left IJ (series 5, image 20)
as described on the neck CT today was not apparent on 05/27/2021.
Superimposed long segment thoracic aortic endograft is chronic and
patent. Superimposed functional stenosis of the left innominate vein
appears stable from earlier this month (innominate passes between
the aortic endograft and bypassed proximal great vessels on series
5, image 55). No associated asymmetric enlargement of the left
subclavian and axillary veins which is chronic to a degree.
Additionally, sequelae of aortic valve replacement and ascending
aorta repair with great vessel diversion beginning on series 5,
image 94 from the ascending vessel. This and the appearance of the
proximal great vessels is stable from earlier this month.

Aortic contrast timing but the central and main pulmonary arteries
are enhanced and do appear to be patent.

Chronic cardiomegaly.  No pericardial effusion.

Mediastinum/Nodes: Postoperative changes to the mediastinum. No
mediastinal mass or lymphadenopathy identified.

Lungs/Pleura: Small chronic left pleural effusion is stable. Lower
lung volumes compared to earlier this month with bilateral dependent
atelectasis. Major airways remain patent. Chronic retrocardiac
atelectasis and/or scarring is stable. No consolidation. No
pulmonary nodularity or cavitation.

Upper Abdomen: Aortic endograft terminates at or just below the
diaphragm. Negative visible upper abdominal viscera.

Musculoskeletal: Prior median sternotomy. No acute osseous
abnormality identified.

Review of the MIP images confirms the above findings.
IMPRESSION: 1. Incompletely thrombosed lower Left IJ as described on the Neck CT
today seems new from the 05/27/2021 Chest CTA. But NOTE there is
evidence of underlying chronic left subclavian venous hypertension
which appears related to anatomic narrowing of the left innominate
vein (series 5, image 55) follow repair of the aortic arch and
ascending aorta (#2).

2. Superimposed postoperative changes to the thoracic aorta
including aortic valve surgery, great vessel bypass from the
ascending aorta, and long segment thoracic aortic endograft which
are stable from earlier this month.

3. Associated chronic cardiomegaly. Small chronic left pleural
effusion. Lower lung volumes with atelectasis.

Study discussed by telephone with Dr. KLPIGBB MOOLMAN on 06/10/2021
at [id number] hours.

## 2024-02-17 ENCOUNTER — Other Ambulatory Visit (HOSPITAL_BASED_OUTPATIENT_CLINIC_OR_DEPARTMENT_OTHER): Payer: Self-pay

## 2024-04-20 IMAGING — CR DG CHEST 2V
2 series · 2 of 2 positions shown · non-contrast
Comparison: 06/09/2021.

CLINICAL DATA: Left posterior chest wall pain.

EXAM:
CHEST - 2 VIEW

[chest pa]
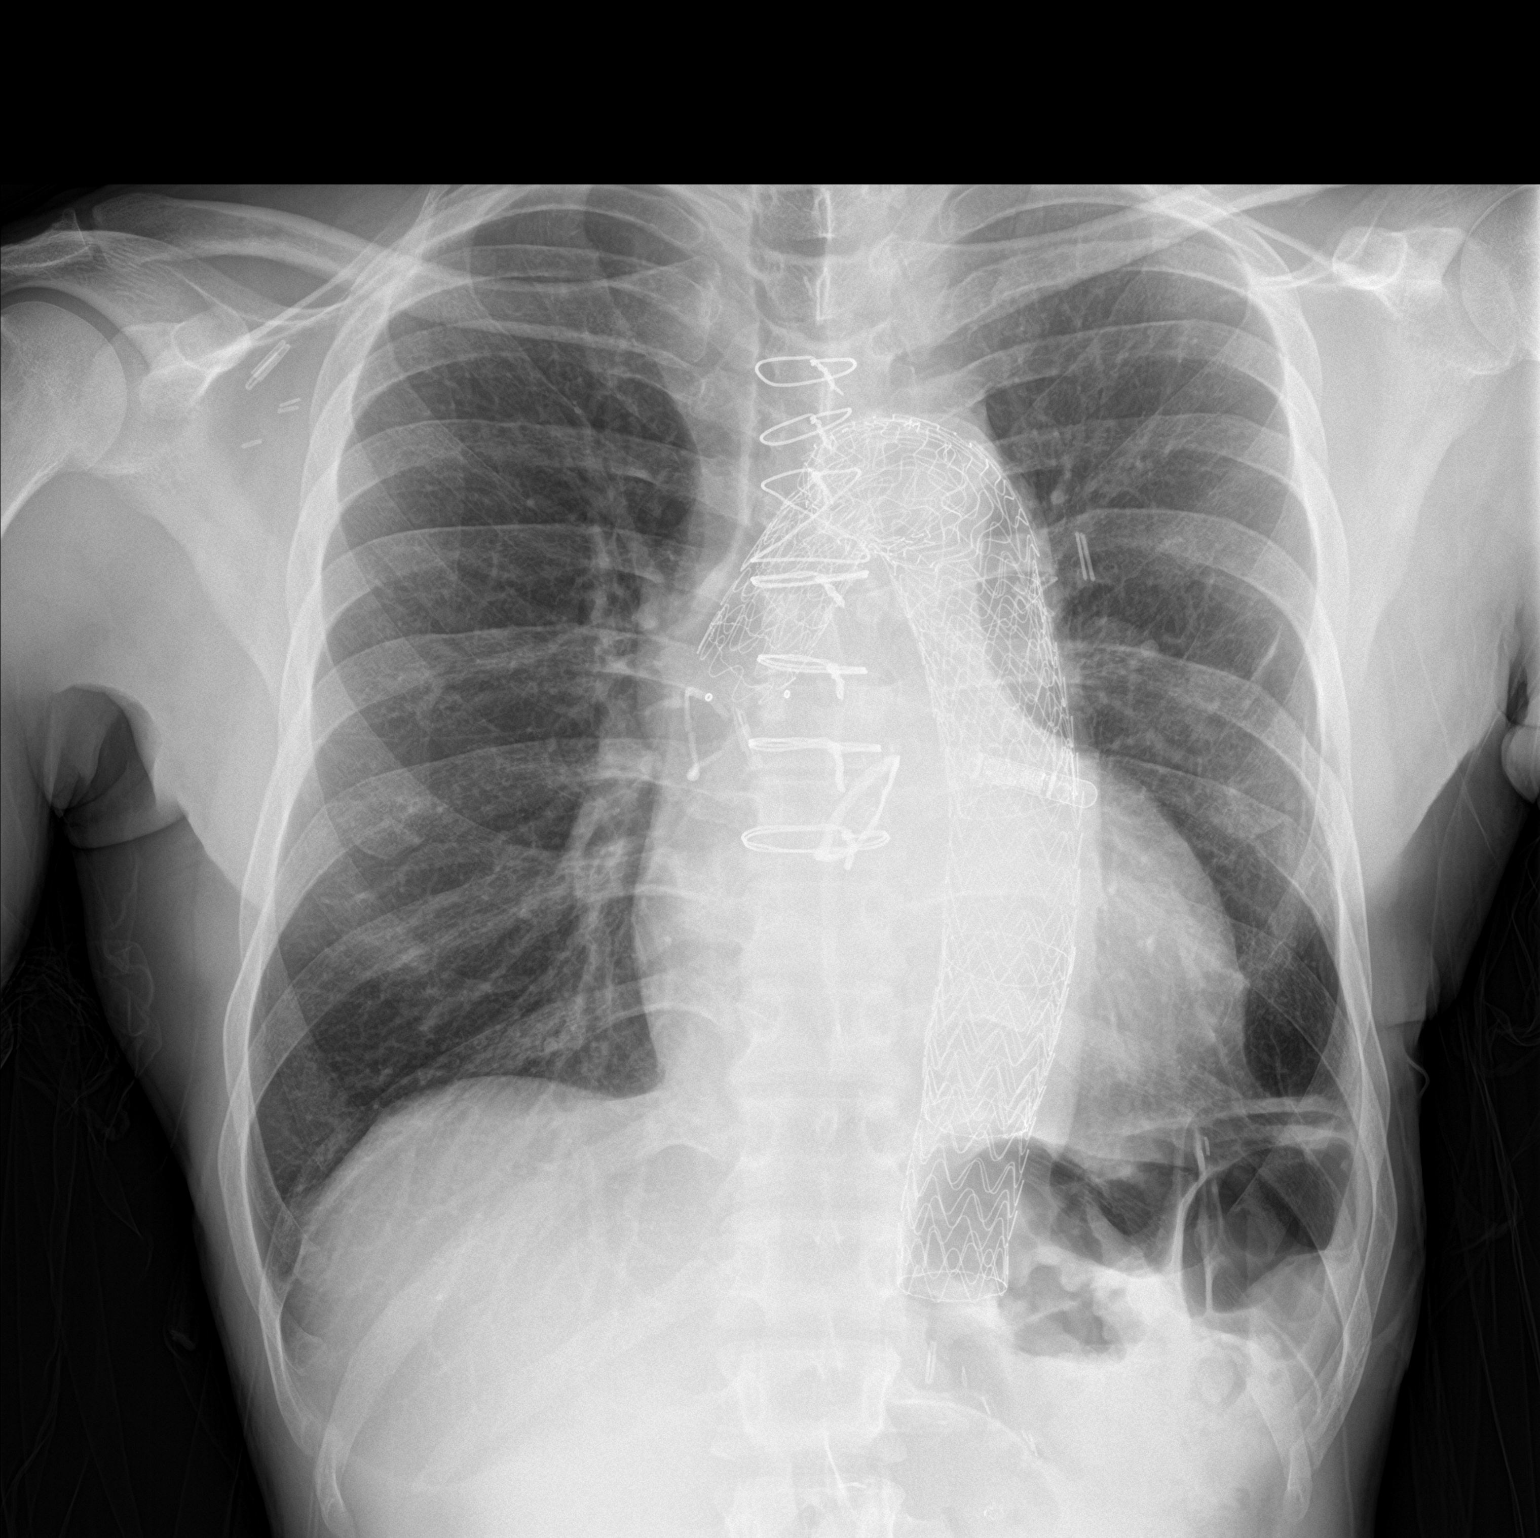

[chest lat]
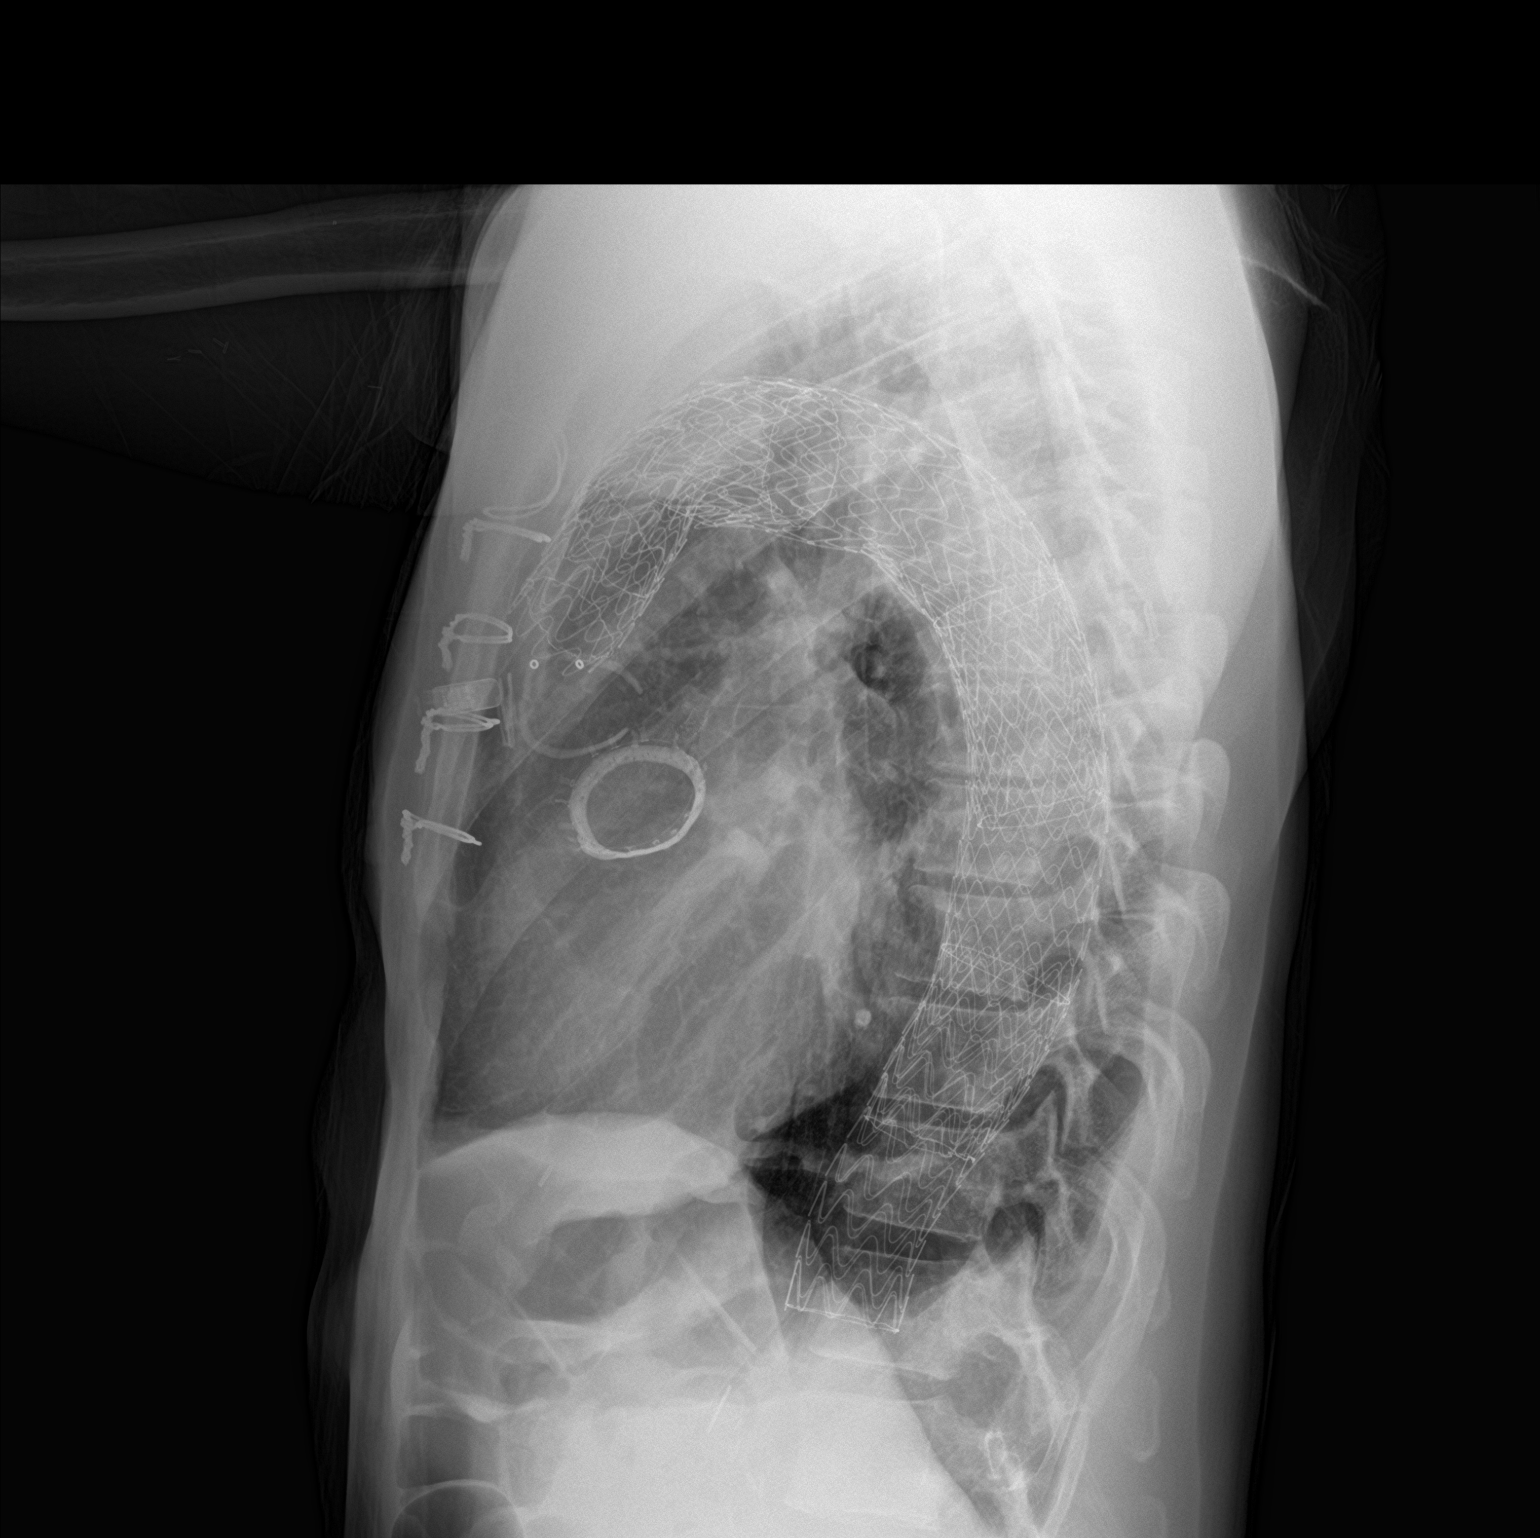

[2 of 2 positions shown; findings below may reference images not displayed]

FINDINGS: Stable changes from cardiac surgery and placement of a long thoracic
aortic stent. Heart is mildly enlarged. No mediastinal or hilar
masses or evidence of lymphadenopathy.

Clear lungs.  No pleural effusion or pneumothorax.

Skeletal structures are intact.
IMPRESSION: No acute cardiopulmonary disease.
